# Patient Record
Sex: Male | Born: 1970 | Race: White | Hispanic: No | Marital: Single | Smoking: Current every day smoker
Health system: Southern US, Community
[De-identification: ages and names within clinical notes are randomized; demographics above are authoritative.]

## PROBLEM LIST (undated history)

## (undated) DIAGNOSIS — R0600 Dyspnea, unspecified: Secondary | ICD-10-CM

## (undated) DIAGNOSIS — E785 Hyperlipidemia, unspecified: Secondary | ICD-10-CM

## (undated) DIAGNOSIS — N186 End stage renal disease: Secondary | ICD-10-CM

## (undated) DIAGNOSIS — N184 Chronic kidney disease, stage 4 (severe): Secondary | ICD-10-CM

## (undated) DIAGNOSIS — Z992 Dependence on renal dialysis: Secondary | ICD-10-CM

## (undated) DIAGNOSIS — T7840XA Allergy, unspecified, initial encounter: Secondary | ICD-10-CM

## (undated) DIAGNOSIS — E119 Type 2 diabetes mellitus without complications: Secondary | ICD-10-CM

## (undated) DIAGNOSIS — I471 Supraventricular tachycardia, unspecified: Secondary | ICD-10-CM

## (undated) DIAGNOSIS — I10 Essential (primary) hypertension: Secondary | ICD-10-CM

## (undated) DIAGNOSIS — D649 Anemia, unspecified: Secondary | ICD-10-CM

## (undated) DIAGNOSIS — F32A Depression, unspecified: Secondary | ICD-10-CM

## (undated) DIAGNOSIS — T8130XA Disruption of wound, unspecified, initial encounter: Secondary | ICD-10-CM

## (undated) DIAGNOSIS — G709 Myoneural disorder, unspecified: Secondary | ICD-10-CM

## (undated) DIAGNOSIS — K219 Gastro-esophageal reflux disease without esophagitis: Secondary | ICD-10-CM

## (undated) DIAGNOSIS — N2581 Secondary hyperparathyroidism of renal origin: Secondary | ICD-10-CM

## (undated) DIAGNOSIS — M726 Necrotizing fasciitis: Secondary | ICD-10-CM

## (undated) DIAGNOSIS — I739 Peripheral vascular disease, unspecified: Secondary | ICD-10-CM

## (undated) DIAGNOSIS — N179 Acute kidney failure, unspecified: Secondary | ICD-10-CM

## (undated) DIAGNOSIS — F329 Major depressive disorder, single episode, unspecified: Secondary | ICD-10-CM

## (undated) HISTORY — DX: Secondary hyperparathyroidism of renal origin: N25.81

## (undated) HISTORY — DX: Peripheral vascular disease, unspecified: I73.9

## (undated) HISTORY — PX: NO PAST SURGERIES: SHX2092

## (undated) HISTORY — DX: Allergy, unspecified, initial encounter: T78.40XA

## (undated) HISTORY — PX: AMPUTATION TOE: SHX6595

## (undated) HISTORY — DX: Myoneural disorder, unspecified: G70.9

## (undated) HISTORY — PX: WISDOM TOOTH EXTRACTION: SHX21

## (undated) HISTORY — DX: Gastro-esophageal reflux disease without esophagitis: K21.9

## (undated) HISTORY — DX: Depression, unspecified: F32.A

## (undated) HISTORY — DX: Disruption of wound, unspecified, initial encounter: T81.30XA

## (undated) HISTORY — DX: Anemia, unspecified: D64.9

## (undated) HISTORY — DX: Necrotizing fasciitis: M72.6

## (undated) HISTORY — DX: Hyperlipidemia, unspecified: E78.5

## (undated) HISTORY — DX: Chronic kidney disease, stage 4 (severe): N18.4

## (undated) HISTORY — PX: POLYPECTOMY: SHX149

---
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
## 2005-07-29 ENCOUNTER — Encounter: Admission: RE | Admit: 2005-07-29 | Discharge: 2005-10-27 | Payer: Self-pay | Admitting: Family Medicine

## 2017-09-23 DIAGNOSIS — N179 Acute kidney failure, unspecified: Secondary | ICD-10-CM

## 2017-09-23 HISTORY — DX: Acute kidney failure, unspecified: N17.9

## 2017-10-11 ENCOUNTER — Inpatient Hospital Stay (HOSPITAL_COMMUNITY)
Admission: EM | Admit: 2017-10-11 | Discharge: 2017-10-22 | DRG: 853 | Disposition: A | Discharge status: Skilled Nursing Facility | Payer: Self-pay | Attending: Internal Medicine | Admitting: Internal Medicine

## 2017-10-11 ENCOUNTER — Emergency Department (HOSPITAL_COMMUNITY): Payer: Self-pay

## 2017-10-11 ENCOUNTER — Encounter (HOSPITAL_COMMUNITY): Payer: Self-pay | Admitting: Emergency Medicine

## 2017-10-11 DIAGNOSIS — R059 Cough, unspecified: Secondary | ICD-10-CM

## 2017-10-11 DIAGNOSIS — M86271 Subacute osteomyelitis, right ankle and foot: Secondary | ICD-10-CM

## 2017-10-11 DIAGNOSIS — R112 Nausea with vomiting, unspecified: Secondary | ICD-10-CM

## 2017-10-11 DIAGNOSIS — E1165 Type 2 diabetes mellitus with hyperglycemia: Secondary | ICD-10-CM | POA: Diagnosis present

## 2017-10-11 DIAGNOSIS — E876 Hypokalemia: Secondary | ICD-10-CM | POA: Diagnosis present

## 2017-10-11 DIAGNOSIS — R05 Cough: Secondary | ICD-10-CM

## 2017-10-11 DIAGNOSIS — E1140 Type 2 diabetes mellitus with diabetic neuropathy, unspecified: Secondary | ICD-10-CM | POA: Diagnosis present

## 2017-10-11 DIAGNOSIS — A419 Sepsis, unspecified organism: Principal | ICD-10-CM | POA: Diagnosis present

## 2017-10-11 DIAGNOSIS — Z833 Family history of diabetes mellitus: Secondary | ICD-10-CM

## 2017-10-11 DIAGNOSIS — E1169 Type 2 diabetes mellitus with other specified complication: Secondary | ICD-10-CM | POA: Diagnosis present

## 2017-10-11 DIAGNOSIS — I10 Essential (primary) hypertension: Secondary | ICD-10-CM | POA: Diagnosis present

## 2017-10-11 DIAGNOSIS — L97516 Non-pressure chronic ulcer of other part of right foot with bone involvement without evidence of necrosis: Secondary | ICD-10-CM | POA: Diagnosis present

## 2017-10-11 DIAGNOSIS — M726 Necrotizing fasciitis: Secondary | ICD-10-CM | POA: Diagnosis present

## 2017-10-11 DIAGNOSIS — Z794 Long term (current) use of insulin: Secondary | ICD-10-CM | POA: Diagnosis present

## 2017-10-11 DIAGNOSIS — M869 Osteomyelitis, unspecified: Secondary | ICD-10-CM | POA: Diagnosis present

## 2017-10-11 DIAGNOSIS — K219 Gastro-esophageal reflux disease without esophagitis: Secondary | ICD-10-CM | POA: Diagnosis present

## 2017-10-11 DIAGNOSIS — E860 Dehydration: Secondary | ICD-10-CM | POA: Diagnosis present

## 2017-10-11 DIAGNOSIS — L98499 Non-pressure chronic ulcer of skin of other sites with unspecified severity: Secondary | ICD-10-CM

## 2017-10-11 DIAGNOSIS — Z87891 Personal history of nicotine dependence: Secondary | ICD-10-CM

## 2017-10-11 DIAGNOSIS — N179 Acute kidney failure, unspecified: Secondary | ICD-10-CM | POA: Diagnosis present

## 2017-10-11 DIAGNOSIS — L03115 Cellulitis of right lower limb: Secondary | ICD-10-CM | POA: Diagnosis present

## 2017-10-11 DIAGNOSIS — E11621 Type 2 diabetes mellitus with foot ulcer: Secondary | ICD-10-CM | POA: Diagnosis present

## 2017-10-11 HISTORY — DX: Essential (primary) hypertension: I10

## 2017-10-11 HISTORY — DX: Type 2 diabetes mellitus with hyperglycemia: E11.65

## 2017-10-11 HISTORY — DX: Acute kidney failure, unspecified: N17.9

## 2017-10-11 HISTORY — DX: Type 2 diabetes mellitus without complications: E11.9

## 2017-10-11 LAB — CBC WITH DIFFERENTIAL/PLATELET
Basophils Absolute: 0 10*3/uL (ref 0.0–0.1)
Basophils Relative: 0 %
Eosinophils Absolute: 0 10*3/uL (ref 0.0–0.7)
Eosinophils Relative: 0 %
HCT: 40.8 % (ref 39.0–52.0)
Hemoglobin: 13.8 g/dL (ref 13.0–17.0)
Lymphocytes Relative: 6 %
Lymphs Abs: 1.5 10*3/uL (ref 0.7–4.0)
MCH: 27.8 pg (ref 26.0–34.0)
MCHC: 33.8 g/dL (ref 30.0–36.0)
MCV: 82.1 fL (ref 78.0–100.0)
Monocytes Absolute: 0.8 10*3/uL (ref 0.1–1.0)
Monocytes Relative: 3 %
Neutro Abs: 22.8 10*3/uL — ABNORMAL HIGH (ref 1.7–7.7)
Neutrophils Relative %: 91 %
Platelets: 275 10*3/uL (ref 150–400)
RBC: 4.97 MIL/uL (ref 4.22–5.81)
RDW: 13.5 % (ref 11.5–15.5)
WBC: 25.1 10*3/uL — ABNORMAL HIGH (ref 4.0–10.5)

## 2017-10-11 LAB — I-STAT CG4 LACTIC ACID, ED
Lactic Acid, Venous: 3.03 mmol/L (ref 0.5–1.9)
Lactic Acid, Venous: 2.35 mmol/L (ref 0.5–1.9)

## 2017-10-11 LAB — COMPREHENSIVE METABOLIC PANEL
ALT: 10 U/L — ABNORMAL LOW (ref 17–63)
AST: 14 U/L — ABNORMAL LOW (ref 15–41)
Albumin: 2.8 g/dL — ABNORMAL LOW (ref 3.5–5.0)
Alkaline Phosphatase: 119 U/L (ref 38–126)
Anion gap: 23 — ABNORMAL HIGH (ref 5–15)
BUN: 31 mg/dL — ABNORMAL HIGH (ref 6–20)
CO2: 22 mmol/L (ref 22–32)
Calcium: 8.7 mg/dL — ABNORMAL LOW (ref 8.9–10.3)
Chloride: 84 mmol/L — ABNORMAL LOW (ref 101–111)
Creatinine, Ser: 1.66 mg/dL — ABNORMAL HIGH (ref 0.61–1.24)
GFR calc Af Amer: 56 mL/min — ABNORMAL LOW (ref >60)
GFR calc non Af Amer: 49 mL/min — ABNORMAL LOW (ref >60)
Glucose, Bld: 422 mg/dL — ABNORMAL HIGH (ref 65–99)
Potassium: 3.7 mmol/L (ref 3.5–5.1)
Sodium: 129 mmol/L — ABNORMAL LOW (ref 135–145)
Total Bilirubin: 1.6 mg/dL — ABNORMAL HIGH (ref 0.3–1.2)
Total Protein: 8.4 g/dL — ABNORMAL HIGH (ref 6.5–8.1)

## 2017-10-11 LAB — I-STAT VENOUS BLOOD GAS, ED
Acid-Base Excess: 5 mmol/L — ABNORMAL HIGH (ref 0.0–2.0)
Bicarbonate: 30.3 mmol/L — ABNORMAL HIGH (ref 20.0–28.0)
O2 Saturation: 56 %
TCO2: 32 mmol/L (ref 22–32)
pCO2, Ven: 43.9 mm[Hg] — ABNORMAL LOW (ref 44.0–60.0)
pH, Ven: 7.446 — ABNORMAL HIGH (ref 7.250–7.430)
pO2, Ven: 28 mm[Hg] — CL (ref 32.0–45.0)

## 2017-10-11 LAB — CBG MONITORING, ED: Glucose-Capillary: 339 mg/dL — ABNORMAL HIGH (ref 65–99)

## 2017-10-11 LAB — LIPASE, BLOOD: Lipase: 28 U/L (ref 11–51)

## 2017-10-11 MED ORDER — ONDANSETRON HCL 4 MG/2ML IJ SOLN
4.0000 mg | Freq: Four times a day (QID) | INTRAMUSCULAR | Status: DC | PRN
Start: 2017-10-11 — End: 2017-10-22
  Administered 2017-10-12 – 2017-10-15 (×5): 4 mg via INTRAVENOUS
  Filled 2017-10-11 (×6): qty 2

## 2017-10-11 MED ORDER — SODIUM CHLORIDE 0.9 % IV BOLUS (SEPSIS)
1000.0000 mL | Freq: Once | INTRAVENOUS | Status: AC
  Administered 2017-10-11: 1000 mL via INTRAVENOUS

## 2017-10-11 MED ORDER — CLINDAMYCIN PHOSPHATE 900 MG/50ML IV SOLN
900.0000 mg | Freq: Once | INTRAVENOUS | Status: AC
  Administered 2017-10-11: 900 mg via INTRAVENOUS
  Filled 2017-10-11: qty 50

## 2017-10-11 MED ORDER — ACETAMINOPHEN 325 MG PO TABS: 650.0000 mg | ORAL_TABLET | Freq: Four times a day (QID) | ORAL | Status: DC | PRN

## 2017-10-11 MED ORDER — PIPERACILLIN-TAZOBACTAM 3.375 G IVPB 30 MIN
3.3750 g | Freq: Once | INTRAVENOUS | Status: AC
  Administered 2017-10-11: 3.375 g via INTRAVENOUS
  Filled 2017-10-11: qty 50

## 2017-10-11 MED ORDER — ACETAMINOPHEN 650 MG RE SUPP: 650.0000 mg | Freq: Four times a day (QID) | RECTAL | Status: DC | PRN

## 2017-10-11 MED ORDER — INSULIN ASPART 100 UNIT/ML ~~LOC~~ SOLN
0.0000 [IU] | SUBCUTANEOUS | Status: DC
  Administered 2017-10-11: 7 [IU] via SUBCUTANEOUS
  Administered 2017-10-12: 5 [IU] via SUBCUTANEOUS
  Filled 2017-10-11 (×2): qty 1

## 2017-10-11 MED ORDER — ONDANSETRON HCL 4 MG/2ML IJ SOLN
4.0000 mg | Freq: Once | INTRAMUSCULAR | Status: AC
Start: 2017-10-11 — End: 2017-10-11
  Administered 2017-10-11: 4 mg via INTRAVENOUS
  Filled 2017-10-11: qty 2

## 2017-10-11 MED ORDER — SODIUM CHLORIDE 0.9 % IV SOLN
INTRAVENOUS | Status: DC
  Administered 2017-10-11: 23:00:00 via INTRAVENOUS

## 2017-10-11 MED ORDER — CLINDAMYCIN PHOSPHATE 900 MG/50ML IV SOLN
900.0000 mg | Freq: Three times a day (TID) | INTRAVENOUS | Status: DC
  Administered 2017-10-12 – 2017-10-13 (×5): 900 mg via INTRAVENOUS
  Filled 2017-10-11 (×7): qty 50

## 2017-10-11 MED ORDER — ONDANSETRON HCL 4 MG PO TABS: 4.0000 mg | ORAL_TABLET | Freq: Four times a day (QID) | ORAL | Status: DC | PRN

## 2017-10-11 MED ORDER — VANCOMYCIN HCL 10 G IV SOLR
2000.0000 mg | Freq: Once | INTRAVENOUS | Status: AC
  Administered 2017-10-11: 2000 mg via INTRAVENOUS
  Filled 2017-10-11: qty 2000

## 2017-10-11 MED ORDER — PIPERACILLIN-TAZOBACTAM 3.375 G IVPB
3.3750 g | Freq: Three times a day (TID) | INTRAVENOUS | Status: DC
  Administered 2017-10-12 – 2017-10-15 (×10): 3.375 g via INTRAVENOUS
  Filled 2017-10-11 (×12): qty 50

## 2017-10-11 MED ORDER — METOCLOPRAMIDE HCL 5 MG/ML IJ SOLN
5.0000 mg | Freq: Once | INTRAMUSCULAR | Status: AC
  Administered 2017-10-11: 5 mg via INTRAVENOUS
  Filled 2017-10-11: qty 2

## 2017-10-11 MED ORDER — VANCOMYCIN HCL 10 G IV SOLR
1250.0000 mg | Freq: Two times a day (BID) | INTRAVENOUS | Status: AC
  Administered 2017-10-12 – 2017-10-15 (×6): 1250 mg via INTRAVENOUS
  Filled 2017-10-11 (×8): qty 1250

## 2017-10-11 MED ORDER — INSULIN GLARGINE 100 UNIT/ML ~~LOC~~ SOLN
10.0000 [IU] | Freq: Every day | SUBCUTANEOUS | Status: DC
  Administered 2017-10-12: 10 [IU] via SUBCUTANEOUS
  Filled 2017-10-11: qty 0.1

## 2017-10-11 NOTE — ED Triage Notes (Signed)
Pt states he has been hiccups for four days that have been causing him to vomit. Pt states he has only been able to eat one can of jello in the past 4 days. Pt also complains that his right foot has an infection- for three days. Foul odor noted in triage. Pt states the underside of his foot has black drainage. Pt has not hiccupped while in triage.

## 2017-10-11 NOTE — ED Notes (Signed)
Patient transported to X-ray 

## 2017-10-11 NOTE — ED Notes (Signed)
Writer notified [facility] [place] of abnormal I-stat lactic, venous result

## 2017-10-11 NOTE — ED Notes (Signed)
Pt's CBG result was 339. Informed Nikki - RN.

## 2017-10-11 NOTE — ED Notes (Signed)
Wound care applied to R foot and lotion applied and wrapped around bilateral legs

## 2017-10-11 NOTE — ED Notes (Signed)
Mali Grose, RN Hydrographic surveyor) and Daralene Milch, RN ([employer] [employer]) notified of I stat lactic acid results 3.03

## 2017-10-11 NOTE — ED Notes (Signed)
[name] [name] (father)- [phone]  cell [phone]

## 2017-10-11 NOTE — Progress Notes (Signed)
Pharmacy Antibiotic Note  [name] [name] is a 46 y.o. male admitted on 10/11/2017 with sepsis.  Pharmacy has been consulted for vancomycin dosing. WBC 25.1, LA 3.0, afebrile  Plan: Vancomycin 2000mg  IV x1 then vancomycin 1250mg   IV every 12 hours.  Goal trough 15-20 mcg/mL.  Height: 6\' 5"  (195.6 cm) Weight: 207 lb (93.9 kg) IBW/kg (Calculated) : 89.1  Temp (24hrs), Avg:98.1 F (36.7 C), Min:98.1 F (36.7 C), Max:98.1 F (36.7 C)  Recent Labs  Lab 10/11/17 1816 10/11/17 1836  WBC 25.1*  --   CREATININE 1.66*  --   LATICACIDVEN  --  3.03*    Estimated Creatinine Clearance: 71.6 mL/min (A) (by C-G formula based on SCr of 1.66 mg/dL (H)).    No Known Allergies  Thank you for allowing pharmacy to be a part of this patient's care.  [name] [name] 10/11/2017 8:07 PM

## 2017-10-11 NOTE — Progress Notes (Signed)
Pharmacy Antibiotic Note  [name] [name] is a 46 y.o. male admitted on 10/11/2017 with sepsis and concern for cellulitis. Per EDP, concern for necrotizing fasciitis as well.  Pharmacy has been consulted for Vancomycin and  dosing. Patient also noted to be on clindamycin per MD. WBC 25.1, LA 2.35, and afebrile.   SCr 1.66 for estimated CrCl ~ 70-75 mL/min.   Patient received vancomycin 2g IV x1 and Zosyn 3.375g IV x1 in the ED.   Plan: Vancomycin 1250 mg IV q12hr  Zosyn 3.375g IV q8hr Vancomycin trough at SS and PRN (goal 15-20 mcg/mL) Monitor renal function, clinical picture, and culture data F/u length of therapy and de-escalation   Height: 6\' 5"  (195.6 cm) Weight: 207 lb (93.9 kg) IBW/kg (Calculated) : 89.1  Temp (24hrs), Avg:98.1 F (36.7 C), Min:98.1 F (36.7 C), Max:98.1 F (36.7 C)  Recent Labs  Lab 10/11/17 1816 10/11/17 1836 10/11/17 2030  WBC 25.1*  --   --   CREATININE 1.66*  --   --   LATICACIDVEN  --  3.03* 2.35*    Estimated Creatinine Clearance: 71.6 mL/min (A) (by C-G formula based on SCr of 1.66 mg/dL (H)).    No Known Allergies   Antimicrobials 11/19 Vanc >>  11/19 Zosyn >>  11/19 Clinda >>   Microbiology: pending   Lavonda Jumbo, PharmD Clinical Pharmacist 10/11/17 11:04 PM

## 2017-10-11 NOTE — H&P (Addendum)
History and Physical    [name] [name]:[id number] DOB: 1971-09-14 DOA: 10/11/2017  PCP: Patient, No Pcp Per  Patient coming from: Home.  Chief Complaint: Nausea vomiting not feeling well.  HPI: [name] [name] is a 46 y.o. male with male with history of diabetes mellitus type 2 has not been to a physician for many years presents to the ER with complaint of not feeling well no nausea vomiting or increasing discharge from the right foot.  Patient stated over the last 1 week patient has benign persistent nausea vomiting occasional diarrhea but denies any abdominal pain.  Also has been having chronic wounds of the lower extremities which has been found to have increasing drainage on the right side.  ED Course: The patient is found to have markedly elevated blood sugar and gap was elevated at 23 has leukocytosis and x-rays revealed possible necrotizing fasciitis of the right foot.  He had physician had discussed with Dr. Eulah Pont on-call orthopedic surgeon who will be seeing patient in consult.  Patient was given fluid bolus for sepsis and started on antibiotics after blood cultures were obtained.  On exam patient is not in distress but still complains of nausea vomiting but abdomen appears benign.  Denies any chest pain.  Review of Systems: As per HPI, rest all negative.   Past Medical History:  Diagnosis Date  . Diabetes mellitus without complication (HCC)   . Hypertension     History reviewed. No pertinent surgical history.   reports that he has quit smoking. he has never used smokeless tobacco. He reports that he does not drink alcohol or use drugs.  No Known Allergies  Family History  Problem Relation Age of Onset  . Diabetes Mellitus II Mother     Prior to Admission medications   Not on File    Physical Exam: Vitals:   10/11/17 2015 10/11/17 2045 10/11/17 2130 10/11/17 2215  BP: 126/85 (!) 139/98 (!) 166/98 (!) 161/99  Pulse: (!) 111 (!) 115 (!) 108 (!) 106    Resp:      Temp:      TempSrc:      SpO2: 99% 99% 98% 97%  Weight:      Height:          Constitutional: Moderately built and nourished. Vitals:   10/11/17 2015 10/11/17 2045 10/11/17 2130 10/11/17 2215  BP: 126/85 (!) 139/98 (!) 166/98 (!) 161/99  Pulse: (!) 111 (!) 115 (!) 108 (!) 106  Resp:      Temp:      TempSrc:      SpO2: 99% 99% 98% 97%  Weight:      Height:       Eyes: Anicteric no pallor. ENMT: No discharge from the ears eyes nose and mouth. Neck: No mass felt.  No neck rigidity. Respiratory: No rhonchi or crepitations. Cardiovascular: S1-S2 heard no murmurs appreciated. Abdomen: Soft nontender bowel sounds present. Musculoskeletal: Patient has dressing on the right foot and left foot has ulceration on the plantar aspect.  Photos revealed ulceration in the right foot with some skin darkening on the medial aspect. Skin: Foot ulcers. Neurologic: Alert awake oriented to time place and person.  Moves all extremities. Psychiatric: Appears normal.  Normal affect.   Labs on Admission: I have personally reviewed following labs and imaging studies  CBC: Recent Labs  Lab 10/11/17 1816  WBC 25.1*  NEUTROABS 22.8*  HGB 13.8  HCT 40.8  MCV 82.1  PLT 275   Basic  Metabolic Panel: Recent Labs  Lab 10/11/17 1816  NA 129*  K 3.7  CL 84*  CO2 22  GLUCOSE 422*  BUN 31*  CREATININE 1.66*  CALCIUM 8.7*   GFR: Estimated Creatinine Clearance: 71.6 mL/min (A) (by C-G formula based on SCr of 1.66 mg/dL (H)). Liver Function Tests: Recent Labs  Lab 10/11/17 1816  AST 14*  ALT 10*  ALKPHOS 119  BILITOT 1.6*  PROT 8.4*  ALBUMIN 2.8*   Recent Labs  Lab 10/11/17 2009  LIPASE 28   No results for input(s): AMMONIA in the last 168 hours. Coagulation Profile: No results for input(s): INR, PROTIME in the last 168 hours. Cardiac Enzymes: No results for input(s): CKTOTAL, CKMB, CKMBINDEX, TROPONINI in the last 168 hours. BNP (last 3 results) No results for  input(s): PROBNP in the last 8760 hours. HbA1C: No results for input(s): HGBA1C in the last 72 hours. CBG: Recent Labs  Lab 10/11/17 2219  GLUCAP 339*   Lipid Profile: No results for input(s): CHOL, HDL, LDLCALC, TRIG, CHOLHDL, LDLDIRECT in the last 72 hours. Thyroid Function Tests: No results for input(s): TSH, T4TOTAL, FREET4, T3FREE, THYROIDAB in the last 72 hours. Anemia Panel: No results for input(s): VITAMINB12, FOLATE, FERRITIN, TIBC, IRON, RETICCTPCT in the last 72 hours. Urine analysis: No results found for: COLORURINE, APPEARANCEUR, LABSPEC, PHURINE, GLUCOSEU, HGBUR, BILIRUBINUR, KETONESUR, PROTEINUR, UROBILINOGEN, NITRITE, LEUKOCYTESUR Sepsis Labs: @LABRCNTIP (procalcitonin:4,lacticidven:4) )No results found for this or any previous visit (from the past 240 hour(s)).   Radiological Exams on Admission: Dg Abdomen Acute W/chest  Result Date: 10/11/2017 CLINICAL DATA:  46 y/o  M; 4 days of hiccups with vomiting. EXAM: DG ABDOMEN ACUTE W/ 1V CHEST COMPARISON:  None. FINDINGS: There is no evidence of dilated bowel loops or free intraperitoneal air. No radiopaque calculi or other significant radiographic abnormality is seen. Heart size and mediastinal contours are within normal limits. Both lungs are clear. IMPRESSION: Negative abdominal radiographs.  No acute cardiopulmonary disease. Electronically Signed   By: Mitzi Hansen M.D.   On: 10/11/2017 20:27   Dg Foot Complete Right  Result Date: 10/11/2017 CLINICAL DATA:  46 year old male with sepsis.  Right foot infection. EXAM: RIGHT FOOT COMPLETE - 3+ VIEW COMPARISON:  None. FINDINGS: There is no acute fracture or dislocation. The bones are osteopenic. Mild arthritic changes of the second MTP joint. No definite bone erosion or periosteal reaction to suggest osteomyelitis. There is diffuse soft tissue edema and scattered areas of soft tissue air primarily over the medial aspect of the tarsal region concerning for  necrotizing fasciitis. Clinical correlation is recommended. IMPRESSION: 1. No acute fracture or dislocation. 2. No definite radiographic evidence of osteomyelitis. MRI or a WBC nuclear scan may provide better evaluation if there is high clinical concern for osteomyelitis. 3. Diffuse soft tissue swelling of the foot with air most consistent with necrotizing fasciitis. Clinical correlation is recommended. Electronically Signed   By: Elgie Collard M.D.   On: 10/11/2017 20:32     Assessment/Plan Active Problems:   Sepsis (HCC)   Cellulitis of right foot   Uncontrolled type 2 diabetes mellitus with hyperglycemia (HCC)   ARF (acute renal failure) (HCC)   Nausea & vomiting    1. Sepsis with possible developing necrotizing fasciitis of the right foot -patient has been placed on vancomycin Zosyn and clindamycin.  Orthopedic surgeon Eulah Pont has been consulted and patient will be kept n.p.o. in anticipation of procedure.  Follow cultures and lactic acid levels. 2. Diabetes mellitus type 2 with possible developing  DKA -I have ordered repeat basic metabolic panel and if it shows persistently elevated anion gap I would start with patient on IV insulin infusion.  Check hemoglobin A1c.  For now I have ordered just Lantus. 3. Elevated blood pressure on admission -we will follow blood pressure trend. 4. Acute renal failure -no old labs to compare.  Follow metabolic panel continue hydration. 5. Nausea vomiting -differentials include possible gastroparesis secondary to diabetes versus gastroenteritis versus possible DKA.  I have ordered 1 dose of Reglan.  Closely follow metabolic panel but abdomen appears benign.  X-rays do not show any obstruction.   DVT prophylaxis: SCDs in anticipation of surgery. Code Status: Full code. Family Communication: Discussed with patient. Disposition Plan: To be determined. Consults called: Orthopedic surgeon. Admission status: Inpatient.   Eduard Clos MD [facility] Pager [phone].  If 7PM-7AM, please contact night-coverage [email or web address] Password [email or web address]  10/11/2017, 10:58 PM

## 2017-10-11 NOTE — ED Provider Notes (Addendum)
[place] EMERGENCY DEPARTMENT Provider Note   CSN: [id number] Arrival date & time: 10/11/17  1751     History   Chief Complaint Chief Complaint  Patient presents with  . Hiccups  . Foot Injury    infection    HPI [name] [name] is a 46 y.o. male.  HPI Patient presents with nausea vomiting and feeling bad.  He has felt bad for the last week.  States his hiccups.  He states he has had very little oral intake.  Has had some urinary frequency.  Also has had a bad wound on his right foot for about the same time.  States is been draining.  It is foul-smelling.  Denies fevers.  States he has been borderline diabetic in the past.  Has not been on any medicines for it but does not usually see a doctor.  States he has had some surgery on his foot in the past at an outside hospital.  States he has been unable to sleep because when he lays down he gets hiccups. has had upper abdominal pain also.  Little bit of diarrhea but has been vomiting frequently he says.  Denies substance abuse. Past Medical History:  Diagnosis Date  . Diabetes mellitus without complication ([place])   . Hypertension     Patient Active Problem List   Diagnosis Date Noted  . Sepsis ([place]) 10/11/2017    History reviewed. No pertinent surgical history.     Home Medications    Prior to Admission medications   Not on File    Family History No family history on file.  Social History Social History   Tobacco Use  . Smoking status: Former Research scientist (life sciences)  . Smokeless tobacco: Never Used  Substance Use Topics  . Alcohol use: No    Frequency: Never  . Drug use: No     Allergies   Patient has no known allergies.   Review of Systems Review of Systems  Constitutional: Positive for fatigue. Negative for appetite change and fever.  Respiratory: Positive for shortness of breath.   Gastrointestinal: Positive for abdominal pain, nausea and vomiting.  Genitourinary: Negative for flank pain and  frequency.  Musculoskeletal: Negative for back pain.  Skin: Positive for wound. Negative for color change.  Neurological: Negative for seizures.  Hematological: Negative for adenopathy.  Psychiatric/Behavioral: Negative for confusion.     Physical Exam Updated Vital Signs BP (!) 139/98   Pulse (!) 115   Temp 98.1 F (36.7 C) (Oral)   Resp 18   Ht 6\' 5"  (1.956 m)   Wt 93.9 kg (207 lb)   SpO2 99%   BMI 24.55 kg/m   Physical Exam  Constitutional: He appears well-developed.  HENT:  Head: Normocephalic.  Cardiovascular:  Mild tachycardia  Pulmonary/Chest: Effort normal.  Abdominal: There is tenderness.  Musculoskeletal: He exhibits edema and tenderness.  Wound to arch of right foot.  Foul-smelling brown drainage with bubbles expressed with palpation of the wound.  Extends both anterior and posterior.  Also has another wound over the ball of the foot.  Also has swelling laterally over the fifth MCP area.  Neurological: He is alert.  Skin: Skin is warm. Capillary refill takes less than 2 seconds.         ED Treatments / Results  Labs (all labs ordered are listed, but only abnormal results are displayed) Labs Reviewed  COMPREHENSIVE METABOLIC PANEL - Abnormal; Notable for the following components:      Result Value  Sodium 129 (*)    Chloride 84 (*)    Glucose, Bld 422 (*)    BUN 31 (*)    Creatinine, Ser 1.66 (*)    Calcium 8.7 (*)    Total Protein 8.4 (*)    Albumin 2.8 (*)    AST 14 (*)    ALT 10 (*)    Total Bilirubin 1.6 (*)    GFR calc non Af Amer 49 (*)    GFR calc Af Amer 56 (*)    Anion gap 23 (*)    All other components within normal limits  CBC WITH DIFFERENTIAL/PLATELET - Abnormal; Notable for the following components:   WBC 25.1 (*)    Neutro Abs 22.8 (*)    All other components within normal limits  I-STAT CG4 LACTIC ACID, ED - Abnormal; Notable for the following components:   Lactic Acid, Venous 3.03 (*)    All other components within normal  limits  I-STAT CG4 LACTIC ACID, ED - Abnormal; Notable for the following components:   Lactic Acid, Venous 2.35 (*)    All other components within normal limits  I-STAT VENOUS BLOOD GAS, ED - Abnormal; Notable for the following components:   pH, Ven 7.446 (*)    pCO2, Ven 43.9 (*)    pO2, Ven 28.0 (*)    Bicarbonate 30.3 (*)    Acid-Base Excess 5.0 (*)    All other components within normal limits  CULTURE, BLOOD (ROUTINE X 2)  CULTURE, BLOOD (ROUTINE X 2)  LIPASE, BLOOD  URINALYSIS, ROUTINE W REFLEX MICROSCOPIC  BLOOD GAS, VENOUS  I-STAT CG4 LACTIC ACID, ED    EKG  EKG Interpretation None       Radiology Dg Abdomen Acute W/chest  Result Date: 10/11/2017 CLINICAL DATA:  46 y/o  M; 4 days of hiccups with vomiting. EXAM: DG ABDOMEN ACUTE W/ 1V CHEST COMPARISON:  None. FINDINGS: There is no evidence of dilated bowel loops or free intraperitoneal air. No radiopaque calculi or other significant radiographic abnormality is seen. Heart size and mediastinal contours are within normal limits. Both lungs are clear. IMPRESSION: Negative abdominal radiographs.  No acute cardiopulmonary disease. Electronically Signed   By: Kristine Garbe M.D.   On: 10/11/2017 20:27   Dg Foot Complete Right  Result Date: 10/11/2017 CLINICAL DATA:  46 year old male with sepsis.  Right foot infection. EXAM: RIGHT FOOT COMPLETE - 3+ VIEW COMPARISON:  None. FINDINGS: There is no acute fracture or dislocation. The bones are osteopenic. Mild arthritic changes of the second MTP joint. No definite bone erosion or periosteal reaction to suggest osteomyelitis. There is diffuse soft tissue edema and scattered areas of soft tissue air primarily over the medial aspect of the tarsal region concerning for necrotizing fasciitis. Clinical correlation is recommended. IMPRESSION: 1. No acute fracture or dislocation. 2. No definite radiographic evidence of osteomyelitis. MRI or a WBC nuclear scan may provide better  evaluation if there is high clinical concern for osteomyelitis. 3. Diffuse soft tissue swelling of the foot with air most consistent with necrotizing fasciitis. Clinical correlation is recommended. Electronically Signed   By: Anner Crete M.D.   On: 10/11/2017 20:32    Procedures Procedures (including critical care time)  Medications Ordered in ED Medications  sodium chloride 0.9 % bolus 1,000 mL (1,000 mLs Intravenous New Bag/Given 10/11/17 2034)  sodium chloride 0.9 % bolus 1,000 mL (1,000 mLs Intravenous New Bag/Given 10/11/17 2033)  vancomycin (VANCOCIN) 2,000 mg in sodium chloride 0.9 % 500 mL IVPB (2,000  mg Intravenous New Bag/Given 10/11/17 2047)  vancomycin (VANCOCIN) 1,250 mg in sodium chloride 0.9 % 250 mL IVPB (not administered)  sodium chloride 0.9 % bolus 1,000 mL (1,000 mLs Intravenous New Bag/Given 10/11/17 2048)  clindamycin (CLEOCIN) IVPB 900 mg (900 mg Intravenous New Bag/Given 10/11/17 2113)  piperacillin-tazobactam (ZOSYN) IVPB 3.375 g (0 g Intravenous Stopped 10/11/17 2112)  ondansetron (ZOFRAN) injection 4 mg (4 mg Intravenous Given 10/11/17 2020)     Initial Impression / Assessment and Plan / ED Course  I have reviewed the triage vital signs and the nursing notes.  Pertinent labs & imaging results that were available during my care of the patient were reviewed by me and considered in my medical decision making (see chart for details).     Patient with gas-forming infection of the right foot.  Potentially necrotizing fasciitis.  White count is elevated.  Initial lactic acid elevated at 3 but has decreased some with some IV fluids.  Zosyn and vancomycin empirically started.  Has elevated glucose and anion gap but is not acidotic.  Will recheck sugar after fluid boluses.  Orthopedic surgery was urgently consulted for likely operating room.  Creatinine mildly elevated but unknown baseline.  Will admit to hospitalist with the comorbidities.  CRITICAL CARE Performed  by: Davonna Belling Total critical care time: 30 minutes Critical care time was exclusive of separately billable procedures and treating other patients. Critical care was necessary to treat or prevent imminent or life-threatening deterioration. Critical care was time spent personally by me on the following activities: development of treatment plan with patient and/or surrogate as well as nursing, discussions with consultants, evaluation of patient's response to treatment, examination of patient, obtaining history from patient or surrogate, ordering and performing treatments and interventions, ordering and review of laboratory studies, ordering and review of radiographic studies, pulse oximetry and re-evaluation of patient's condition.  Patient does have source of infection.  Creatinine is mildly elevated but unknown baseline.  With potential severe and as of this I have activated a code sepsis.  However lactic acid is not severely elevated and has not been hypotensive.  Does have potential endorgan damage however with the elevated creatinine. Dr. Percell Miller is reviewing patient from orthopedic surgery.   Final Clinical Impressions(s) / ED Diagnoses   Final diagnoses:  Sepsis, due to unspecified organism [facility])    ED Discharge Orders    None      Davonna Belling, MD 10/11/17 2049  Davonna Belling, MD 10/11/17 2118

## 2017-10-12 ENCOUNTER — Other Ambulatory Visit: Payer: Self-pay

## 2017-10-12 ENCOUNTER — Encounter (HOSPITAL_COMMUNITY): Payer: Self-pay | Admitting: General Practice

## 2017-10-12 ENCOUNTER — Telehealth (INDEPENDENT_AMBULATORY_CARE_PROVIDER_SITE_OTHER): Payer: Self-pay

## 2017-10-12 ENCOUNTER — Other Ambulatory Visit (INDEPENDENT_AMBULATORY_CARE_PROVIDER_SITE_OTHER): Payer: Self-pay | Admitting: Orthopedic Surgery

## 2017-10-12 ENCOUNTER — Inpatient Hospital Stay (HOSPITAL_COMMUNITY): Payer: Self-pay

## 2017-10-12 DIAGNOSIS — E1165 Type 2 diabetes mellitus with hyperglycemia: Secondary | ICD-10-CM

## 2017-10-12 DIAGNOSIS — L03115 Cellulitis of right lower limb: Secondary | ICD-10-CM

## 2017-10-12 DIAGNOSIS — M86271 Subacute osteomyelitis, right ankle and foot: Secondary | ICD-10-CM

## 2017-10-12 DIAGNOSIS — N179 Acute kidney failure, unspecified: Secondary | ICD-10-CM

## 2017-10-12 LAB — BASIC METABOLIC PANEL
Anion gap: 16 — ABNORMAL HIGH (ref 5–15)
Anion gap: 16 — ABNORMAL HIGH (ref 5–15)
Anion gap: 14 (ref 5–15)
Anion gap: 14 (ref 5–15)
Anion gap: 12 (ref 5–15)
BUN: 29 mg/dL — ABNORMAL HIGH (ref 6–20)
BUN: 30 mg/dL — ABNORMAL HIGH (ref 6–20)
BUN: 26 mg/dL — ABNORMAL HIGH (ref 6–20)
BUN: 26 mg/dL — ABNORMAL HIGH (ref 6–20)
BUN: 25 mg/dL — ABNORMAL HIGH (ref 6–20)
CO2: 22 mmol/L (ref 22–32)
CO2: 25 mmol/L (ref 22–32)
CO2: 25 mmol/L (ref 22–32)
CO2: 27 mmol/L (ref 22–32)
CO2: 28 mmol/L (ref 22–32)
Calcium: 7.6 mg/dL — ABNORMAL LOW (ref 8.9–10.3)
Calcium: 8 mg/dL — ABNORMAL LOW (ref 8.9–10.3)
Calcium: 8 mg/dL — ABNORMAL LOW (ref 8.9–10.3)
Calcium: 8.2 mg/dL — ABNORMAL LOW (ref 8.9–10.3)
Calcium: 8.2 mg/dL — ABNORMAL LOW (ref 8.9–10.3)
Chloride: 95 mmol/L — ABNORMAL LOW (ref 101–111)
Chloride: 96 mmol/L — ABNORMAL LOW (ref 101–111)
Chloride: 97 mmol/L — ABNORMAL LOW (ref 101–111)
Chloride: 97 mmol/L — ABNORMAL LOW (ref 101–111)
Chloride: 96 mmol/L — ABNORMAL LOW (ref 101–111)
Creatinine, Ser: 1.44 mg/dL — ABNORMAL HIGH (ref 0.61–1.24)
Creatinine, Ser: 1.34 mg/dL — ABNORMAL HIGH (ref 0.61–1.24)
Creatinine, Ser: 1.19 mg/dL (ref 0.61–1.24)
Creatinine, Ser: 1.18 mg/dL (ref 0.61–1.24)
Creatinine, Ser: 1.23 mg/dL (ref 0.61–1.24)
GFR calc Af Amer: >60 mL/min (ref >60)
GFR calc Af Amer: >60 mL/min (ref >60)
GFR calc Af Amer: >60 mL/min (ref >60)
GFR calc Af Amer: >60 mL/min (ref >60)
GFR calc Af Amer: >60 mL/min (ref >60)
GFR calc non Af Amer: 58 mL/min — ABNORMAL LOW (ref >60)
GFR calc non Af Amer: >60 mL/min (ref >60)
GFR calc non Af Amer: >60 mL/min (ref >60)
GFR calc non Af Amer: >60 mL/min (ref >60)
GFR calc non Af Amer: >60 mL/min (ref >60)
Glucose, Bld: 355 mg/dL — ABNORMAL HIGH (ref 65–99)
Glucose, Bld: 271 mg/dL — ABNORMAL HIGH (ref 65–99)
Glucose, Bld: 233 mg/dL — ABNORMAL HIGH (ref 65–99)
Glucose, Bld: 175 mg/dL — ABNORMAL HIGH (ref 65–99)
Glucose, Bld: 171 mg/dL — ABNORMAL HIGH (ref 65–99)
Potassium: 3.3 mmol/L — ABNORMAL LOW (ref 3.5–5.1)
Potassium: 3.7 mmol/L (ref 3.5–5.1)
Potassium: 3.1 mmol/L — ABNORMAL LOW (ref 3.5–5.1)
Potassium: 2.9 mmol/L — ABNORMAL LOW (ref 3.5–5.1)
Potassium: 2.7 mmol/L — CL (ref 3.5–5.1)
Sodium: 133 mmol/L — ABNORMAL LOW (ref 135–145)
Sodium: 137 mmol/L (ref 135–145)
Sodium: 136 mmol/L (ref 135–145)
Sodium: 138 mmol/L (ref 135–145)
Sodium: 136 mmol/L (ref 135–145)

## 2017-10-12 LAB — GLUCOSE, CAPILLARY
Glucose-Capillary: 177 mg/dL — ABNORMAL HIGH (ref 65–99)
Glucose-Capillary: 176 mg/dL — ABNORMAL HIGH (ref 65–99)
Glucose-Capillary: 201 mg/dL — ABNORMAL HIGH (ref 65–99)
Glucose-Capillary: 159 mg/dL — ABNORMAL HIGH (ref 65–99)
Glucose-Capillary: 127 mg/dL — ABNORMAL HIGH (ref 65–99)

## 2017-10-12 LAB — LACTIC ACID, PLASMA: Lactic Acid, Venous: 1.5 mmol/L (ref 0.5–1.9)

## 2017-10-12 LAB — CBC
HCT: 34.4 % — ABNORMAL LOW (ref 39.0–52.0)
Hemoglobin: 11.5 g/dL — ABNORMAL LOW (ref 13.0–17.0)
MCH: 27.4 pg (ref 26.0–34.0)
MCHC: 33.4 g/dL (ref 30.0–36.0)
MCV: 81.9 fL (ref 78.0–100.0)
Platelets: 251 10*3/uL (ref 150–400)
RBC: 4.2 MIL/uL — ABNORMAL LOW (ref 4.22–5.81)
RDW: 13.5 % (ref 11.5–15.5)
WBC: 21.6 10*3/uL — ABNORMAL HIGH (ref 4.0–10.5)

## 2017-10-12 LAB — HIV ANTIBODY (ROUTINE TESTING W REFLEX): HIV Screen 4th Generation wRfx: NONREACTIVE

## 2017-10-12 LAB — CBG MONITORING, ED
Glucose-Capillary: 216 mg/dL — ABNORMAL HIGH (ref 65–99)
Glucose-Capillary: 222 mg/dL — ABNORMAL HIGH (ref 65–99)
Glucose-Capillary: 226 mg/dL — ABNORMAL HIGH (ref 65–99)
Glucose-Capillary: 256 mg/dL — ABNORMAL HIGH (ref 65–99)
Glucose-Capillary: 298 mg/dL — ABNORMAL HIGH (ref 65–99)

## 2017-10-12 LAB — HEMOGLOBIN A1C
Hgb A1c MFr Bld: 11.1 % — ABNORMAL HIGH (ref 4.8–5.6)
Mean Plasma Glucose: 271.87 mg/dL

## 2017-10-12 LAB — URINALYSIS, ROUTINE W REFLEX MICROSCOPIC
Bacteria, UA: NONE SEEN
Bilirubin Urine: NEGATIVE
Glucose, UA: >=500 mg/dL — AB
Ketones, ur: 80 mg/dL — AB
Leukocytes, UA: NEGATIVE
Nitrite: NEGATIVE
Protein, ur: 30 mg/dL — AB
Specific Gravity, Urine: 1.026 (ref 1.005–1.030)
Squamous Epithelial / HPF: NONE SEEN
pH: 5 (ref 5.0–8.0)

## 2017-10-12 LAB — RAPID URINE DRUG SCREEN, HOSP PERFORMED
Amphetamines: NOT DETECTED
Barbiturates: NOT DETECTED
Benzodiazepines: NOT DETECTED
Cocaine: NOT DETECTED
Opiates: NOT DETECTED
Tetrahydrocannabinol: NOT DETECTED

## 2017-10-12 LAB — TSH: TSH: 1.058 u[IU]/mL (ref 0.350–4.500)

## 2017-10-12 MED ORDER — SODIUM CHLORIDE 0.9 % IV SOLN
INTRAVENOUS | Status: AC
  Administered 2017-10-12: 1.7 [IU]/h via INTRAVENOUS
  Filled 2017-10-12: qty 1

## 2017-10-12 MED ORDER — SODIUM CHLORIDE 0.9 % IV SOLN: INTRAVENOUS | Status: DC

## 2017-10-12 MED ORDER — INSULIN GLARGINE 100 UNIT/ML ~~LOC~~ SOLN
20.0000 [IU] | Freq: Every day | SUBCUTANEOUS | Status: DC
  Administered 2017-10-12 – 2017-10-15 (×4): 20 [IU] via SUBCUTANEOUS
  Filled 2017-10-12 (×4): qty 0.2

## 2017-10-12 MED ORDER — INSULIN GLARGINE 100 UNIT/ML ~~LOC~~ SOLN
20.0000 [IU] | Freq: Every day | SUBCUTANEOUS | Status: DC
  Filled 2017-10-12 (×2): qty 0.2

## 2017-10-12 MED ORDER — POTASSIUM CHLORIDE CRYS ER 20 MEQ PO TBCR
40.0000 meq | EXTENDED_RELEASE_TABLET | Freq: Once | ORAL | Status: AC
  Administered 2017-10-12: 40 meq via ORAL
  Filled 2017-10-12: qty 2

## 2017-10-12 MED ORDER — CHLORHEXIDINE GLUCONATE 4 % EX LIQD
60.0000 mL | Freq: Once | CUTANEOUS | Status: AC
  Administered 2017-10-12: 4 via TOPICAL

## 2017-10-12 MED ORDER — CEFAZOLIN SODIUM-DEXTROSE 2-4 GM/100ML-% IV SOLN
2.0000 g | INTRAVENOUS | Status: DC
  Filled 2017-10-12 (×2): qty 100

## 2017-10-12 MED ORDER — INSULIN ASPART 100 UNIT/ML ~~LOC~~ SOLN
0.0000 [IU] | SUBCUTANEOUS | Status: DC
  Administered 2017-10-12: 2 [IU] via SUBCUTANEOUS
  Administered 2017-10-13 (×2): 3 [IU] via SUBCUTANEOUS
  Administered 2017-10-13: 5 [IU] via SUBCUTANEOUS
  Administered 2017-10-13: 2 [IU] via SUBCUTANEOUS
  Administered 2017-10-13: 3 [IU] via SUBCUTANEOUS
  Administered 2017-10-14 (×2): 2 [IU] via SUBCUTANEOUS

## 2017-10-12 MED ORDER — DEXTROSE-NACL 5-0.45 % IV SOLN
INTRAVENOUS | Status: AC
  Administered 2017-10-12: 11:00:00 via INTRAVENOUS

## 2017-10-12 MED ORDER — DEXTROSE 50 % IV SOLN: 25.0000 mL | INTRAVENOUS | Status: DC | PRN

## 2017-10-12 MED ORDER — INSULIN REGULAR BOLUS VIA INFUSION
0.0000 [IU] | Freq: Three times a day (TID) | INTRAVENOUS | Status: DC
  Administered 2017-10-12: 6 [IU] via INTRAVENOUS
  Filled 2017-10-12: qty 10

## 2017-10-12 MED ORDER — PROMETHAZINE HCL 25 MG/ML IJ SOLN
12.5000 mg | Freq: Four times a day (QID) | INTRAMUSCULAR | Status: DC | PRN
  Administered 2017-10-12: 12.5 mg via INTRAVENOUS
  Filled 2017-10-12: qty 1

## 2017-10-12 MED ORDER — SODIUM CHLORIDE 0.9 % IV SOLN
INTRAVENOUS | Status: DC
  Administered 2017-10-12 – 2017-10-13 (×2): via INTRAVENOUS

## 2017-10-12 NOTE — Progress Notes (Signed)
CRITICAL VALUE ALERT  Critical Value:  Potassium 2.7  Date & Time Notied:  10/12/17  Provider Notified:Yes. Dr. Thereasa Solo Orders Received/Actions taken: Awaiting.

## 2017-10-12 NOTE — Telephone Encounter (Signed)
Call report from [facility] for MRI right foot result read to Dr. Sharol Given by tech. Pt is in the ER currently at [facility]. Dr. 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

## 2017-10-12 NOTE — H&P (View-Only) (Signed)
Patient ID: [name] [name], male   DOB: [birth year], 46 y.o.   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  Patient has initially refused surgery.  We will plan for transtibial amputation tomorrow around noon.  Will plan for n.p.o. after midnight.  IV antibiotics to continue preoperatively.

## 2017-10-12 NOTE — ED Notes (Signed)
Patient transported to MRI 

## 2017-10-12 NOTE — Consult Note (Signed)
ORTHOPAEDIC CONSULTATION  REQUESTING PHYSICIAN: Rise Patience, MD  Chief Complaint: Foul-smelling necrotic ulcer plantar aspect right foot  HPI: [name] [name] is a 46 y.o. male who presents with several day history of necrotic ulcer plantar aspect of the right foot.  Patient states that he has had multiple ulcers on the bottom of his foot for over 4 years.  Patient states he is unaware of his hemoglobin A1c.  Past Medical History:  Diagnosis Date  . Diabetes mellitus without complication ([place])   . Hypertension    History reviewed. No pertinent surgical history. Social History   Socioeconomic History  . Marital status: Widowed    Spouse name: None  . Number of children: None  . Years of education: None  . Highest education level: None  Social Needs  . Financial resource strain: None  . Food insecurity - worry: None  . Food insecurity - inability: None  . Transportation needs - medical: None  . Transportation needs - non-medical: None  Occupational History  . None  Tobacco Use  . Smoking status: Former Research scientist (life sciences)  . Smokeless tobacco: Never Used  Substance and Sexual Activity  . Alcohol use: No    Frequency: Never  . Drug use: No  . Sexual activity: None  Other Topics Concern  . None  Social History Narrative  . None   Family History  Problem Relation Age of Onset  . Diabetes Mellitus II Mother    - negative except otherwise stated in the family history section No Known Allergies Prior to Admission medications   Not on File   Dg Abdomen Acute W/chest  Result Date: 10/11/2017 CLINICAL DATA:  46 y/o  M; 4 days of hiccups with vomiting. EXAM: DG ABDOMEN ACUTE W/ 1V CHEST COMPARISON:  None. FINDINGS: There is no evidence of dilated bowel loops or free intraperitoneal air. No radiopaque calculi or other significant radiographic abnormality is seen. Heart size and mediastinal contours are within normal limits. Both lungs are clear. IMPRESSION: Negative  abdominal radiographs.  No acute cardiopulmonary disease. Electronically Signed   By: Kristine Garbe M.D.   On: 10/11/2017 20:27   Dg Foot Complete Right  Result Date: 10/11/2017 CLINICAL DATA:  46 year old male with sepsis.  Right foot infection. EXAM: RIGHT FOOT COMPLETE - 3+ VIEW COMPARISON:  None. FINDINGS: There is no acute fracture or dislocation. The bones are osteopenic. Mild arthritic changes of the second MTP joint. No definite bone erosion or periosteal reaction to suggest osteomyelitis. There is diffuse soft tissue edema and scattered areas of soft tissue air primarily over the medial aspect of the tarsal region concerning for necrotizing fasciitis. Clinical correlation is recommended. IMPRESSION: 1. No acute fracture or dislocation. 2. No definite radiographic evidence of osteomyelitis. MRI or a WBC nuclear scan may provide better evaluation if there is high clinical concern for osteomyelitis. 3. Diffuse soft tissue swelling of the foot with air most consistent with necrotizing fasciitis. Clinical correlation is recommended. Electronically Signed   By: Anner Crete M.D.   On: 10/11/2017 20:32   - pertinent xrays, CT, MRI studies were reviewed and independently interpreted  Positive ROS: All other systems have been reviewed and were otherwise negative with the exception of those mentioned in the HPI and as above.  Physical Exam: General: Alert, no acute distress Psychiatric: Patient is competent for consent with normal mood and affect Lymphatic: No axillary or cervical lymphadenopathy Cardiovascular: No pedal edema Respiratory: No cyanosis, no use of accessory musculature GI:  No organomegaly, abdomen is soft and non-tender  Skin: Examination patient has a large necrotic ulcer on the plantar aspect of the right foot.  The ulcer is approximately 4 cm in diameter and probes to bone.   Neurologic: Patient does not have protective sensation bilateral lower  extremities.   MUSCULOSKELETAL:  Patient has a good dorsalis pedis pulse.  He has a foul-smelling ulcer on the plantar aspect of the right foot.  The radiographs shows air in the soft tissue beneath the ulcer.  This area does not track up the leg.  The leg has no crepitation there is chronic venous stasis changes.  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  Plan: Plan: Discussed with the patient that the optimal management would be for a below the knee amputation.  Patient states that he cannot consider this.  Will order an MRI scan to further evaluate the soft tissue and bony pathology of his foot.  Possible surgery Wednesday if patient will consent.  Thank you for the consult and the opportunity to see Mr. [name] [name], [place] [phone] 6:46 AM

## 2017-10-12 NOTE — Progress Notes (Signed)
[place] [facility] TEAM 1 - Stepdown/ICU TEAM  [name] [name]  IFO:[id number] DOB: [birth year] DOA: 10/11/2017 PCP: Patient, No Pcp Per    Brief Narrative:  46 y.o. male who has not been to a physician in years w/ a history of DM2 who presented to the ER with nausea vomiting and increasing discharge from a right foot wound.   In the ED Xrays revealed a possible necrotizing fasciitis of the right foot, and Ortho was consulted.  He also required an insulin gtt for severely uncontrolled DM.  Significant Events: 11/19 admit   Subjective: The patient is resting comfortably in bed.  He denies chest pain shortness of breath fevers chills nausea or vomiting at this time.  He tells me he has no questions concerning his treatment plan.  Assessment & Plan:  Sepsis due to necrotic ulcer on R foot  Ortho following - cont empiric abx - for transtibial amputation 11/21 per Dr. Sharol Given   Severely uncontrolled DM2 Bicarb is normal, as is anion gap - CBG much improved - wean off insulin gtt - follow CBG closely to maximize wound healing - A1c 11.1 - not on meds prior to admit   Elevated BP Unclear if he has true HTN or not but suspect he does - watch BP as acute illness improves  Hypokalemia  Likely due to a combination of malnutrition as well as IV insulin treatment - supplement and follow - check magnesium  Renal failure of unclear chronicity  No baseline labs available - follow w/ volume expansion and resolution of sepsis   Recent Labs  Lab 10/11/17 2320 10/12/17 0419 10/12/17 1014 10/12/17 1215 10/12/17 1429  CREATININE 1.44* 1.34* 1.19 1.18 1.23    DVT prophylaxis: SCDs Code Status: FULL CODE Family Communication: no family present at time of exam  Disposition Plan: SDU for now - to OR in AM   Consultants:  Ortho Sharol Given   Antimicrobials:  Clindamycin 11/19 > Zosyn 11/19 > Vancomycin 11/19  Objective: Blood pressure 136/69, pulse 89, temperature 98.7 F (37.1 C), temperature  source Oral, resp. rate (!) 22, height 6\' 5"  (1.956 m), weight 93.9 kg (207 lb), SpO2 99 %.  Intake/Output Summary (Last 24 hours) at 10/12/2017 1658 Last data filed at 10/12/2017 1123 Gross per 24 hour  Intake 4649 ml  Output 1250 ml  Net 3399 ml   Filed Weights   10/11/17 1756 10/11/17 1802  Weight: 93.9 kg (207 lb) 93.9 kg (207 lb)    Examination: General: No acute respiratory distress Lungs: Clear to auscultation bilaterally without wheezes or crackles Cardiovascular: Regular rate and rhythm without murmur gallop or rub normal S1 and S2 Abdomen: Nontender, nondistended, soft, bowel sounds positive, no rebound, no ascites, no appreciable mass Extremities: No significant cyanosis, clubbing, or edema bilateral lower extremities  CBC: Recent Labs  Lab 10/11/17 1816 10/12/17 0419  WBC 25.1* 21.6*  NEUTROABS 22.8*  --   HGB 13.8 11.5*  HCT 40.8 34.4*  MCV 82.1 81.9  PLT 275 676   Basic Metabolic Panel: Recent Labs  Lab 10/11/17 2320 10/12/17 0419 10/12/17 1014 10/12/17 1215 10/12/17 1429  NA 133* 137 136 138 136  K 3.3* 3.7 3.1* 2.9* 2.7*  CL 95* 96* 97* 97* 96*  CO2 22 25 25 27 28   GLUCOSE 355* 271* 233* 175* 171*  BUN 29* 30* 26* 26* 25*  CREATININE 1.44* 1.34* 1.19 1.18 1.23  CALCIUM 7.6* 8.0* 8.0* 8.2* 8.2*   GFR: Estimated Creatinine Clearance: 96.6 mL/min (by C-G  formula based on SCr of 1.23 mg/dL).  Liver Function Tests: Recent Labs  Lab 10/11/17 1816  AST 14*  ALT 10*  ALKPHOS 119  BILITOT 1.6*  PROT 8.4*  ALBUMIN 2.8*   Recent Labs  Lab 10/11/17 2009  LIPASE 28    HbA1C: Hgb A1c MFr Bld  Date/Time Value Ref Range Status  10/11/2017 11:20 PM 11.1 (H) 4.8 - 5.6 % Final    Comment:    (NOTE) Pre diabetes:          5.7%-6.4% Diabetes:              >6.4% Glycemic control for   <7.0% adults with diabetes     CBG: Recent Labs  Lab 10/12/17 0956 10/12/17 1125 10/12/17 1302 10/12/17 1424 10/12/17 1543  GLUCAP 226* 216* 177* 176*  201*    Recent Results (from the past 240 hour(s))  Culture, blood (routine x 2)     Status: None (Preliminary result)   Collection Time: 10/11/17  7:40 PM  Result Value Ref Range Status   Specimen Description BLOOD RIGHT ANTECUBITAL  Final   Special Requests   Final    BOTTLES DRAWN AEROBIC AND ANAEROBIC Blood Culture adequate volume   Culture NO GROWTH < 24 HOURS  Final   Report Status PENDING  Incomplete  Culture, blood (routine x 2)     Status: None (Preliminary result)   Collection Time: 10/11/17  7:45 PM  Result Value Ref Range Status   Specimen Description BLOOD LEFT ANTECUBITAL  Final   Special Requests   Final    BOTTLES DRAWN AEROBIC AND ANAEROBIC Blood Culture adequate volume   Culture NO GROWTH < 24 HOURS  Final   Report Status PENDING  Incomplete     Scheduled Meds: . insulin regular  0-10 Units Intravenous TID WC     LOS: 1 day   Cherene Altes, MD [facility] Office  [phone] Pager - Text Page per Shea Evans as per below:  On-Call/Text Page:      [email or web address]      password [email or web address]  If 7PM-7AM, please contact night-coverage [email or web address] Password [facility] 10/12/2017, 4:58 PM

## 2017-10-12 NOTE — ED Notes (Signed)
This RN unable to hang vancomycin that was ordered for 8 AM at this time as it is not available in ED. Will send message to pharmacy to send to 4N so it is ready on pt arrival.

## 2017-10-12 NOTE — Progress Notes (Signed)
Patient ID: [name] [name], male   DOB: Jun 05, 1971, 46 y.o.   MRN: [id number] Patient's MRI scan shows abscess osteomyelitis and inflammation of the fascia.  Patient has initially refused surgery.  We will plan for transtibial amputation tomorrow around noon.  Will plan for n.p.o. after midnight.  IV antibiotics to continue preoperatively.

## 2017-10-13 ENCOUNTER — Inpatient Hospital Stay (HOSPITAL_COMMUNITY): Payer: Self-pay | Admitting: Certified Registered"

## 2017-10-13 ENCOUNTER — Encounter (HOSPITAL_COMMUNITY): Payer: Self-pay | Admitting: Orthopedic Surgery

## 2017-10-13 ENCOUNTER — Encounter (HOSPITAL_COMMUNITY)
Admission: EM | Disposition: A | Discharge status: Skilled Nursing Facility | Payer: Self-pay | Source: Home / Self Care | Attending: Family Medicine

## 2017-10-13 DIAGNOSIS — M726 Necrotizing fasciitis: Secondary | ICD-10-CM

## 2017-10-13 DIAGNOSIS — A419 Sepsis, unspecified organism: Principal | ICD-10-CM

## 2017-10-13 HISTORY — PX: AMPUTATION: SHX166

## 2017-10-13 HISTORY — DX: Necrotizing fasciitis: M72.6

## 2017-10-13 LAB — COMPREHENSIVE METABOLIC PANEL
ALT: 13 U/L — ABNORMAL LOW (ref 17–63)
AST: 19 U/L (ref 15–41)
Albumin: 1.9 g/dL — ABNORMAL LOW (ref 3.5–5.0)
Alkaline Phosphatase: 87 U/L (ref 38–126)
Anion gap: 8 (ref 5–15)
BUN: 19 mg/dL (ref 6–20)
CO2: 29 mmol/L (ref 22–32)
Calcium: 8 mg/dL — ABNORMAL LOW (ref 8.9–10.3)
Chloride: 100 mmol/L — ABNORMAL LOW (ref 101–111)
Creatinine, Ser: 1.04 mg/dL (ref 0.61–1.24)
GFR calc Af Amer: >60 mL/min (ref >60)
GFR calc non Af Amer: >60 mL/min (ref >60)
Glucose, Bld: 104 mg/dL — ABNORMAL HIGH (ref 65–99)
Potassium: 2.9 mmol/L — ABNORMAL LOW (ref 3.5–5.1)
Sodium: 137 mmol/L (ref 135–145)
Total Bilirubin: 1.3 mg/dL — ABNORMAL HIGH (ref 0.3–1.2)
Total Protein: 6.1 g/dL — ABNORMAL LOW (ref 6.5–8.1)

## 2017-10-13 LAB — GLUCOSE, CAPILLARY
Glucose-Capillary: 101 mg/dL — ABNORMAL HIGH (ref 65–99)
Glucose-Capillary: 111 mg/dL — ABNORMAL HIGH (ref 65–99)
Glucose-Capillary: 118 mg/dL — ABNORMAL HIGH (ref 65–99)
Glucose-Capillary: 138 mg/dL — ABNORMAL HIGH (ref 65–99)
Glucose-Capillary: 178 mg/dL — ABNORMAL HIGH (ref 65–99)
Glucose-Capillary: 181 mg/dL — ABNORMAL HIGH (ref 65–99)
Glucose-Capillary: 200 mg/dL — ABNORMAL HIGH (ref 65–99)
Glucose-Capillary: 210 mg/dL — ABNORMAL HIGH (ref 65–99)

## 2017-10-13 LAB — MAGNESIUM: Magnesium: 2 mg/dL (ref 1.7–2.4)

## 2017-10-13 LAB — SURGICAL PCR SCREEN
MRSA, PCR: NEGATIVE
Staphylococcus aureus: NEGATIVE

## 2017-10-13 SURGERY — AMPUTATION BELOW KNEE
Anesthesia: General | Site: Leg Lower | Laterality: Right

## 2017-10-13 MED ORDER — ONDANSETRON HCL 4 MG PO TABS: 4.0000 mg | ORAL_TABLET | Freq: Four times a day (QID) | ORAL | Status: DC | PRN

## 2017-10-13 MED ORDER — FENTANYL CITRATE (PF) 100 MCG/2ML IJ SOLN
INTRAMUSCULAR | Status: AC
  Filled 2017-10-13: qty 2

## 2017-10-13 MED ORDER — SUCCINYLCHOLINE CHLORIDE 20 MG/ML IJ SOLN
INTRAMUSCULAR | Status: DC | PRN
  Administered 2017-10-13: 120 mg via INTRAVENOUS

## 2017-10-13 MED ORDER — ONDANSETRON HCL 4 MG/2ML IJ SOLN: 4.0000 mg | Freq: Four times a day (QID) | INTRAMUSCULAR | Status: DC | PRN

## 2017-10-13 MED ORDER — LIDOCAINE HCL (CARDIAC) 20 MG/ML IV SOLN
INTRAVENOUS | Status: DC | PRN
  Administered 2017-10-13: 80 mg via INTRAVENOUS

## 2017-10-13 MED ORDER — HYDROMORPHONE HCL 1 MG/ML IJ SOLN
1.0000 mg | INTRAMUSCULAR | Status: DC | PRN
  Administered 2017-10-13 – 2017-10-14 (×2): 1 mg via INTRAVENOUS
  Filled 2017-10-13 (×2): qty 1

## 2017-10-13 MED ORDER — METOCLOPRAMIDE HCL 5 MG/ML IJ SOLN
5.0000 mg | Freq: Three times a day (TID) | INTRAMUSCULAR | Status: DC | PRN
  Administered 2017-10-13 (×2): 5 mg via INTRAVENOUS
  Filled 2017-10-13: qty 2

## 2017-10-13 MED ORDER — FENTANYL CITRATE (PF) 100 MCG/2ML IJ SOLN
INTRAMUSCULAR | Status: AC
  Administered 2017-10-13: 50 ug via INTRAVENOUS
  Filled 2017-10-13: qty 2

## 2017-10-13 MED ORDER — METHOCARBAMOL 1000 MG/10ML IJ SOLN
500.0000 mg | Freq: Four times a day (QID) | INTRAVENOUS | Status: DC | PRN
  Filled 2017-10-13: qty 5

## 2017-10-13 MED ORDER — MIDAZOLAM HCL 5 MG/5ML IJ SOLN
INTRAMUSCULAR | Status: DC | PRN
  Administered 2017-10-13: 2 mg via INTRAVENOUS

## 2017-10-13 MED ORDER — OXYCODONE HCL 5 MG PO TABS
5.0000 mg | ORAL_TABLET | ORAL | Status: DC | PRN
  Administered 2017-10-13 – 2017-10-16 (×2): 5 mg via ORAL
  Filled 2017-10-13 (×4): qty 1

## 2017-10-13 MED ORDER — MIDAZOLAM HCL 2 MG/2ML IJ SOLN
INTRAMUSCULAR | Status: AC
Start: 2017-10-13 — End: ?
  Filled 2017-10-13: qty 2

## 2017-10-13 MED ORDER — METHOCARBAMOL 500 MG PO TABS
500.0000 mg | ORAL_TABLET | Freq: Four times a day (QID) | ORAL | Status: DC | PRN
  Administered 2017-10-14 – 2017-10-22 (×20): 500 mg via ORAL
  Filled 2017-10-13 (×20): qty 1

## 2017-10-13 MED ORDER — SODIUM CHLORIDE 0.9 % IV SOLN: INTRAVENOUS | Status: DC

## 2017-10-13 MED ORDER — ONDANSETRON HCL 4 MG/2ML IJ SOLN
INTRAMUSCULAR | Status: DC | PRN
  Administered 2017-10-13: 4 mg via INTRAVENOUS

## 2017-10-13 MED ORDER — POTASSIUM CHLORIDE 10 MEQ/100ML IV SOLN
10.0000 meq | INTRAVENOUS | Status: AC
  Administered 2017-10-13 (×2): 10 meq via INTRAVENOUS
  Filled 2017-10-13 (×2): qty 100

## 2017-10-13 MED ORDER — DEXAMETHASONE SODIUM PHOSPHATE 10 MG/ML IJ SOLN
INTRAMUSCULAR | Status: DC | PRN
  Administered 2017-10-13: 10 mg via INTRAVENOUS

## 2017-10-13 MED ORDER — POTASSIUM CHLORIDE 10 MEQ/100ML IV SOLN
10.0000 meq | INTRAVENOUS | Status: AC
  Administered 2017-10-13: 10 meq via INTRAVENOUS
  Filled 2017-10-13: qty 100

## 2017-10-13 MED ORDER — POLYETHYLENE GLYCOL 3350 17 G PO PACK: 17.0000 g | PACK | Freq: Every day | ORAL | Status: DC | PRN

## 2017-10-13 MED ORDER — OXYCODONE HCL 5 MG PO TABS
10.0000 mg | ORAL_TABLET | ORAL | Status: DC | PRN
  Administered 2017-10-13 – 2017-10-22 (×39): 10 mg via ORAL
  Filled 2017-10-13 (×39): qty 2

## 2017-10-13 MED ORDER — FENTANYL CITRATE (PF) 250 MCG/5ML IJ SOLN
INTRAMUSCULAR | Status: AC
  Filled 2017-10-13: qty 5

## 2017-10-13 MED ORDER — DOCUSATE SODIUM 100 MG PO CAPS
100.0000 mg | ORAL_CAPSULE | Freq: Two times a day (BID) | ORAL | Status: DC
  Administered 2017-10-14 – 2017-10-19 (×6): 100 mg via ORAL
  Filled 2017-10-13 (×10): qty 1

## 2017-10-13 MED ORDER — BISACODYL 10 MG RE SUPP: 10.0000 mg | Freq: Every day | RECTAL | Status: DC | PRN

## 2017-10-13 MED ORDER — LACTATED RINGERS IV SOLN
INTRAVENOUS | Status: DC
  Administered 2017-10-13: 09:00:00 via INTRAVENOUS

## 2017-10-13 MED ORDER — FENTANYL CITRATE (PF) 100 MCG/2ML IJ SOLN
INTRAMUSCULAR | Status: DC | PRN
  Administered 2017-10-13: 150 ug via INTRAVENOUS

## 2017-10-13 MED ORDER — MAGNESIUM CITRATE PO SOLN: 1.0000 | Freq: Once | ORAL | Status: DC | PRN

## 2017-10-13 MED ORDER — PROPOFOL 10 MG/ML IV BOLUS
INTRAVENOUS | Status: AC
  Filled 2017-10-13: qty 20

## 2017-10-13 MED ORDER — METOCLOPRAMIDE HCL 10 MG PO TABS: 5.0000 mg | ORAL_TABLET | Freq: Three times a day (TID) | ORAL | Status: DC | PRN

## 2017-10-13 MED ORDER — PROPOFOL 10 MG/ML IV BOLUS
INTRAVENOUS | Status: DC | PRN
  Administered 2017-10-13: 200 mg via INTRAVENOUS

## 2017-10-13 MED ORDER — POTASSIUM CHLORIDE 10 MEQ/100ML IV SOLN
10.0000 meq | INTRAVENOUS | Status: AC
  Administered 2017-10-13 (×2): 10 meq via INTRAVENOUS
  Filled 2017-10-13 (×3): qty 100

## 2017-10-13 MED ORDER — ACETAMINOPHEN 650 MG RE SUPP: 650.0000 mg | RECTAL | Status: DC | PRN

## 2017-10-13 MED ORDER — LACTATED RINGERS IV SOLN
INTRAVENOUS | Status: DC | PRN
  Administered 2017-10-13: 10:00:00 via INTRAVENOUS

## 2017-10-13 MED ORDER — ACETAMINOPHEN 325 MG PO TABS
650.0000 mg | ORAL_TABLET | ORAL | Status: DC | PRN
  Administered 2017-10-20: 650 mg via ORAL
  Filled 2017-10-13: qty 2

## 2017-10-13 MED ORDER — FENTANYL CITRATE (PF) 100 MCG/2ML IJ SOLN
25.0000 ug | INTRAMUSCULAR | Status: DC | PRN
Start: 2017-10-13 — End: 2017-10-13
  Administered 2017-10-13 (×3): 50 ug via INTRAVENOUS

## 2017-10-13 SURGICAL SUPPLY — 32 items
BLADE SAW RECIP 87.9 MT (BLADE) ×2 IMPLANT
BLADE SURG 21 STRL SS (BLADE) ×2 IMPLANT
BNDG COHESIVE 6X5 TAN STRL LF (GAUZE/BANDAGES/DRESSINGS) ×3 IMPLANT
BNDG GAUZE ELAST 4 BULKY (GAUZE/BANDAGES/DRESSINGS) ×3 IMPLANT
CANISTER WOUNDNEG PRESSURE 500 (CANNISTER) ×1 IMPLANT
COVER SURGICAL LIGHT HANDLE (MISCELLANEOUS) ×1 IMPLANT
CUFF TOURNIQUET SINGLE 34IN LL (TOURNIQUET CUFF) ×1 IMPLANT
CUFF TOURNIQUET SINGLE 44IN (TOURNIQUET CUFF) IMPLANT
DRAPE INCISE IOBAN 66X45 STRL (DRAPES) ×1 IMPLANT
DRAPE U-SHAPE 47X51 STRL (DRAPES) ×2 IMPLANT
DRSG VAC ATS MED SENSATRAC (GAUZE/BANDAGES/DRESSINGS) ×2 IMPLANT
ELECT REM PT RETURN 9FT ADLT (ELECTROSURGICAL) ×2
ELECTRODE REM PT RTRN 9FT ADLT (ELECTROSURGICAL) ×1 IMPLANT
GLOVE BIOGEL PI IND STRL 9 (GLOVE) ×1 IMPLANT
GLOVE BIOGEL PI INDICATOR 9 (GLOVE) ×1
GLOVE SURG ORTHO 9.0 STRL STRW (GLOVE) ×2 IMPLANT
GOWN STRL REUS W/ TWL XL LVL3 (GOWN DISPOSABLE) ×2 IMPLANT
GOWN STRL REUS W/TWL XL LVL3 (GOWN DISPOSABLE) ×4
KIT BASIN OR (CUSTOM PROCEDURE TRAY) ×2 IMPLANT
KIT ROOM TURNOVER OR (KITS) ×2 IMPLANT
MANIFOLD NEPTUNE II (INSTRUMENTS) ×1 IMPLANT
NS IRRIG 1000ML POUR BTL (IV SOLUTION) ×2 IMPLANT
PACK ORTHO EXTREMITY (CUSTOM PROCEDURE TRAY) ×2 IMPLANT
PAD ARMBOARD 7.5X6 YLW CONV (MISCELLANEOUS) ×2 IMPLANT
SPONGE LAP 18X18 X RAY DECT (DISPOSABLE) IMPLANT
STAPLER VISISTAT 35W (STAPLE) IMPLANT
STOCKINETTE IMPERVIOUS LG (DRAPES) ×2 IMPLANT
SUT ETHILON 2 0 PSLX (SUTURE) ×1 IMPLANT
SUT SILK 2 0 (SUTURE) ×2
SUT SILK 2-0 18XBRD TIE 12 (SUTURE) ×1 IMPLANT
SUT VIC AB 1 CTX 27 (SUTURE) IMPLANT
TOWEL OR 17X26 10 PK STRL BLUE (TOWEL DISPOSABLE) ×2 IMPLANT

## 2017-10-13 NOTE — Progress Notes (Signed)
2/2 CHG baths complete, gown and linen changed. Report provided to short stay RN. Patient currently infusing 2/3 IV K+ in RFA, Second IV site needs to be established. IV K+ sent to shorts stay via tube station in order to complete med order.Patient continues to request to speak with Dr. Sharol Given prior to surgery but agreed to complete everything neccessary for possible surgery and proceed to short stay. Patient transporting to short stay at this time. CCMD notified.

## 2017-10-13 NOTE — Progress Notes (Signed)
Patient refusing to sign consent for surgery tonight.Patient wants to speak to Dr.Duda in morning.Will report off to day shift nurse for MD to come speak to patient.

## 2017-10-13 NOTE — Anesthesia Preprocedure Evaluation (Addendum)
Anesthesia Evaluation  Patient identified by MRN, date of birth, ID band Patient awake    Reviewed: Allergy & Precautions, H&P , NPO status , Patient's Chart, lab work & pertinent test results  Airway Mallampati: III  TM Distance: >3 FB Neck ROM: full    Dental  (+) Poor Dentition, Dental Advisory Given, Chipped, Loose   Pulmonary former smoker,    Pulmonary exam normal breath sounds clear to auscultation       Cardiovascular hypertension, Pt. on medications (-) Past MI  Rhythm:regular Rate:Normal     Neuro/Psych negative neurological ROS  negative psych ROS   GI/Hepatic GERD  Poorly Controlled,  Endo/Other  diabetes, Type 2Hypokalemia, being repleted  Renal/GU ARFRenal disease     Musculoskeletal negative musculoskeletal ROS (+)   Abdominal   Peds  Hematology  (+) anemia ,   Anesthesia Other Findings   Reproductive/Obstetrics                            Anesthesia Physical Anesthesia Plan  ASA: IV  Anesthesia Plan: General   Post-op Pain Management:    Induction: Intravenous  PONV Risk Score and Plan: 3 and Ondansetron, Midazolam and Treatment may vary due to age or medical condition  Airway Management Planned: Oral ETT  Additional Equipment: None  Intra-op Plan:   Post-operative Plan: Extubation in OR  Informed Consent: I have reviewed the patients History and Physical, chart, labs and discussed the procedure including the risks, benefits and alternatives for the proposed anesthesia with the patient or authorized representative who has indicated his/her understanding and acceptance.   Dental Advisory Given  Plan Discussed with: CRNA  Anesthesia Plan Comments: ( )       Anesthesia Quick Evaluation

## 2017-10-13 NOTE — Interval H&P Note (Signed)
History and Physical Interval Note:  10/13/2017 6:28 AM  [name] [name]  has presented today for surgery, with the diagnosis of Osteomyelitis, Abscess Right Foot  The various methods of treatment have been discussed with the patient and family. After consideration of risks, benefits and other options for treatment, the patient has consented to  Procedure(s): RIGHT BELOW KNEE AMPUTATION (Right) as a surgical intervention .  The patient's history has been reviewed, patient examined, no change in status, stable for surgery.  I have reviewed the patient's chart and labs.  Questions were answered to the patient's satisfaction.     [name]

## 2017-10-13 NOTE — Progress Notes (Signed)
Patient potassium level 2.9 this morning.Patient remains NPO for surgery this morning.Text paged NP Arby Barrette. New orders received.

## 2017-10-13 NOTE — Op Note (Signed)
   Date of Surgery: 10/13/2017  INDICATIONS: Mr. Battiste is a 46 y.o.-year-old male who has gangrene and necrotizing fasciitis of the right foot.  Patient has been on appropriate IV antibiotics and presents at this time for limb salvage intervention for transtibial amputation.Marland Kitchen  PREOPERATIVE DIAGNOSIS: Gangrene osteomyelitis necrotizing fasciitis right foot  POSTOPERATIVE DIAGNOSIS: Same.  PROCEDURE: Transtibial amputation Application of Prevena wound VAC  SURGEON: Sharol Given, M.D.  ANESTHESIA:  general  IV FLUIDS AND URINE: See anesthesia.  ESTIMATED BLOOD LOSS: min mL.  COMPLICATIONS: None.  DESCRIPTION OF PROCEDURE: The patient was brought to the operating room and underwent a general anesthetic. After adequate levels of anesthesia were obtained patient's lower extremity was prepped using DuraPrep draped into a sterile field. A timeout was called. The foot was draped out of the sterile field with impervious stockinette. A transverse incision was made 11 cm distal to the tibial tubercle. This curved proximally and a large posterior flap was created. The tibia was transected 1 cm proximal to the skin incision. The fibula was transected just proximal to the tibial incision. The tibia was beveled anteriorly. A large posterior flap was created. The sciatic nerve was pulled cut and allowed to retract. The vascular bundles were suture ligated with 2-0 silk. The deep and superficial fascial layers were closed using #1 Vicryl. The skin was closed using staples and 2-0 nylon. The wound was covered with a Prevena wound VAC. There was a good suction fit. Patient was extubated taken to the PACU in stable condition.  Meridee Score, MD [place] 10:35 AM

## 2017-10-13 NOTE — Progress Notes (Signed)
Progress Note    [name] [name]  ZOX:[id number] DOB: Jul 02, 1971  DOA: 10/11/2017 PCP: Patient, No Pcp Per    Brief Narrative:   Chief complaint: F/U DM/right foot infection  Medical records reviewed and are as summarized below:  [name] [name] is an 46 y.o. male who has not been to a physician in years w/ a history of DM2 who presented to the ER with nausea vomiting and increasing discharge from a right foot wound.   In the ED Xrays revealed a possible necrotizing fasciitis of the right foot, and Ortho was consulted.  He also required an insulin gtt for severely uncontrolled DM.  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 Lactate has cleared. Continue vancomycin/Zosyn for 48 hours postoperatively. Discontinue clindamycin  Active Problems:   Uncontrolled type 2 diabetes mellitus with hyperglycemia (HCC) Hemoglobin A1c 11.1%. Patient was not on medications prior to admission. Initially managed with an insulin drip. Currently on Lantus 20 units daily and moderate scale SSI every 4 hours. CBGs 101-159.    ARF (acute renal failure) (HCC) Baseline creatinine unknown. Creatinine has improved with treatment of his sepsis.    Hypokalemia Persistent despite supplementation. No evidence of hypomagnesemia. Has received 3 runs of potassium already this morning, will order another 3 runs.   Body mass index is 24.55 kg/m.   Family Communication/Anticipated D/C date and plan/Code Status   DVT prophylaxis: SCDs. Code Status: Full Code.  Family Communication: Patient wore today. Disposition Plan: Home when stable postoperatively.   Medical Consultants:    Orthopedic Surgery   Anti-Infectives:    None  Subjective:   Patient in the OR.  Objective:    Vitals:   10/12/17 1605 10/12/17 2014 10/13/17 0001 10/13/17 0430  BP: 136/69 (!) 142/89 (!) 135/91 (!) 145/84  Pulse: 89 88 79 99  Resp: (!) 22 14 (!) 23 15  Temp: 98.7 F (37.1 C) 99 F (37.2 C) 99 F (37.2 C) 99.4 F (37.4 C)  TempSrc: Oral Oral Oral Oral  SpO2: 99% 99% 98% 97%  Weight:      Height:        Intake/Output Summary (Last 24 hours) at 10/13/2017 0830 Last data filed at 10/12/2017 2203 Gross per 24 hour  Intake 2263.17 ml  Output 300 ml  Net 1963.17 ml   Filed Weights   10/11/17 1756 10/11/17 1802  Weight: 93.9 kg (207 lb) 93.9 kg (207 lb)    Exam: Patient not examined today. In the OR.  Data Reviewed:   I have personally reviewed following labs and imaging studies:  Labs: Labs show the following:   Basic Metabolic Panel: Recent Labs  Lab 10/12/17 0419 10/12/17 1014 10/12/17 1215 10/12/17 1429 10/13/17 0426  NA 137 136 138 136 137  K 3.7 3.1* 2.9* 2.7* 2.9*  CL 96* 97* 97* 96* 100*  CO2 25 25 27 28 29   GLUCOSE 271* 233* 175* 171* 104*  BUN 30* 26* 26* 25* 19  CREATININE 1.34* 1.19 1.18 1.23 1.04  CALCIUM 8.0* 8.0* 8.2* 8.2* 8.0*  MG  --   --   --   --  2.0   GFR Estimated Creatinine Clearance: 114.2 mL/min (by C-G formula based on SCr of 1.04 mg/dL). Liver Function Tests: Recent Labs  Lab 10/11/17 1816 10/13/17 0426  AST  14* 19  ALT 10* 13*  ALKPHOS 119 87  BILITOT 1.6* 1.3*  PROT 8.4* 6.1*  ALBUMIN 2.8* 1.9*   Recent Labs  Lab 10/11/17 2009  LIPASE 28    CBC: Recent Labs  Lab 10/11/17 1816 10/12/17 0419  WBC 25.1* 21.6*  NEUTROABS 22.8*  --   HGB 13.8 11.5*  HCT 40.8 34.4*  MCV 82.1 81.9  PLT 275 251   CBG: Recent Labs  Lab 10/12/17 1712 10/12/17 2015 10/13/17 0009 10/13/17 0431 10/13/17 0820  GLUCAP 159* 127* 138* 101* 111*   Hgb A1c: Recent Labs    10/11/17 2320  HGBA1C 11.1*   Thyroid function studies: Recent Labs    10/11/17 2320  TSH 1.058   Sepsis Labs: Recent Labs  Lab 10/11/17 1816  10/11/17 1836 10/11/17 2030 10/12/17 0419 10/12/17 1014  WBC 25.1*  --   --  21.6*  --   LATICACIDVEN  --  3.03* 2.35*  --  1.5    Microbiology Recent Results (from the past 240 hour(s))  Culture, blood (routine x 2)     Status: None (Preliminary result)   Collection Time: 10/11/17  7:40 PM  Result Value Ref Range Status   Specimen Description BLOOD RIGHT ANTECUBITAL  Final   Special Requests   Final    BOTTLES DRAWN AEROBIC AND ANAEROBIC Blood Culture adequate volume   Culture NO GROWTH < 24 HOURS  Final   Report Status PENDING  Incomplete  Culture, blood (routine x 2)     Status: None (Preliminary result)   Collection Time: 10/11/17  7:45 PM  Result Value Ref Range Status   Specimen Description BLOOD LEFT ANTECUBITAL  Final   Special Requests   Final    BOTTLES DRAWN AEROBIC AND ANAEROBIC Blood Culture adequate volume   Culture NO GROWTH < 24 HOURS  Final   Report Status PENDING  Incomplete  Surgical pcr screen     Status: None   Collection Time: 10/12/17  8:40 PM  Result Value Ref Range Status   MRSA, PCR NEGATIVE NEGATIVE Final   Staphylococcus aureus NEGATIVE NEGATIVE Final    Comment: (NOTE) The Xpert SA Assay (FDA approved for NASAL specimens in patients 14 years of age and older), is one component of a comprehensive surveillance program. It is not intended to diagnose infection nor to guide or monitor treatment.     Procedures and diagnostic studies:  Mr Foot Right Wo Contrast  Result Date: 10/12/2017 CLINICAL DATA:  Several day history of foot ulcers on the plantar aspect of the right foot. Diabetic. EXAM: MRI OF THE RIGHT FOREFOOT WITHOUT CONTRAST TECHNIQUE: Multiplanar, multisequence MR imaging of the right foot was performed. No intravenous contrast was administered. COMPARISON:  Radiographs 10/11/2017 FINDINGS: Diffuse subcutaneous soft tissue swelling/ edema/ fluid consistent with cellulitis. There is also scattered gas in the subcutaneous tissues, mainly  along the plantar aspect of the foot likely due to open wounds or gangrene. Diffuse myofasciitis without discrete finding for pyomyositis. Some gas in the muscles concerning for necrotizing fasciitis. Small open wounds are noted on the plantar aspect of foot. One is located approximately overlying the area of the third metatarsal head. Another wound is located in the medial midfoot area on the plantar aspect with associated subcutaneous gas. MR findings consistent with septic arthritis at the third MTP joint and associated osteomyelitis involving the third metatarsal head and third proximal phalanx. There is also an overlying abscess dorsally and septic tenosynovitis along the plantar aspect of the  third toe. No other definite sites of osteomyelitis are identified. IMPRESSION: 1. Septic arthritis involving the third MTP joint with osteomyelitis in the proximal phalanx and third metatarsal head. Adjacent dorsal abscess measures 2.6 cm. There is also plantar septic tenosynovitis. 2. Plantar foot wounds and adjacent subcutaneous gas. 3. 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 4. Severe myofasciitis with findings suspicious for necrotizing fasciitis. These results will be called to the ordering clinician or representative by the Radiologist Assistant, and communication documented in the PACS or zVision Dashboard. Electronically Signed   By: Marijo Sanes M.D.   On: 10/12/2017 09:26   Dg Abdomen Acute W/chest  Result Date: 10/11/2017 CLINICAL DATA:  46 y/o  M; 4 days of hiccups with vomiting. EXAM: DG ABDOMEN ACUTE W/ 1V CHEST COMPARISON:  None. FINDINGS: There is no evidence of dilated bowel loops or free intraperitoneal air. No radiopaque calculi or other significant radiographic abnormality is seen. Heart size and mediastinal contours are within normal limits. Both lungs are clear. IMPRESSION: Negative abdominal radiographs.  No acute cardiopulmonary disease. Electronically Signed   By: Kristine Garbe M.D.   On: 10/11/2017 20:27   Dg Foot Complete Right  Result Date: 10/11/2017 CLINICAL DATA:  46 year old male with sepsis.  Right foot infection. EXAM: RIGHT FOOT COMPLETE - 3+ VIEW COMPARISON:  None. FINDINGS: There is no acute fracture or dislocation. The bones are osteopenic. Mild arthritic changes of the second MTP joint. No definite bone erosion or periosteal reaction to suggest osteomyelitis. There is diffuse soft tissue edema and scattered areas of soft tissue air primarily over the medial aspect of the tarsal region concerning for necrotizing fasciitis. Clinical correlation is recommended. IMPRESSION: 1. No acute fracture or dislocation. 2. No definite radiographic evidence of osteomyelitis. MRI or a WBC nuclear scan may provide better evaluation if there is high clinical concern for osteomyelitis. 3. Diffuse soft tissue swelling of the foot with air most consistent with necrotizing fasciitis. Clinical correlation is recommended. Electronically Signed   By: Anner Crete M.D.   On: 10/11/2017 20:32    Medications:   . insulin aspart  0-15 Units Subcutaneous Q4H  . insulin glargine  20 Units Subcutaneous QHS   Continuous Infusions: . sodium chloride 75 mL/hr at 10/12/17 1924  .  ceFAZolin (ANCEF) IV    . clindamycin (CLEOCIN) IV Stopped (10/13/17 0631)  . piperacillin-tazobactam (ZOSYN)  IV 3.375 g (10/13/17 0601)  . potassium chloride Stopped (10/13/17 0819)  . vancomycin Stopped (10/12/17 2201)     LOS: 2 days   Jacquelynn Cree  [facility] Pager [phone]. If unable to reach me by pager, please call my cell phone at [phone].  *Please refer to [email or web address], password [email or web address] to get updated schedule on who will round on this patient, as hospitalists switch teams weekly. If 7PM-7AM, please contact night-coverage at [email or web address], password [email or web address] for any overnight needs.  10/13/2017, 8:30 AM

## 2017-10-13 NOTE — Progress Notes (Signed)
   10/13/17 1800  Urine Characteristics  Time patient last voided or urinary catheter emptied 0845  Urinary Interventions Bladder scan  Bladder Scan Volume (mL) 620 mL   Per Dr. Rockne Menghini place indwelling urinary catheter until 0600 10/14/2017. Order entered. RN will continue to monitor patient.

## 2017-10-13 NOTE — Transfer of Care (Signed)
Immediate Anesthesia Transfer of Care Note  Patient: [name] [name]  Procedure(s) Performed: RIGHT BELOW KNEE AMPUTATION (Right Leg Lower)  Patient Location: PACU  Anesthesia Type:General  Level of Consciousness: awake, alert , patient cooperative and responds to stimulation  Airway & Oxygen Therapy: Patient Spontanous Breathing and Patient connected to face mask oxygen  Post-op Assessment: Report given to RN, Post -op Vital signs reviewed and stable and Patient moving all extremities X 4  Post vital signs: Reviewed and stable  Last Vitals:  Vitals:   10/13/17 0830 10/13/17 1045  BP: (!) 150/95   Pulse: 95 97  Resp: 18   Temp: 37 C 36.6 C  SpO2: 97%     Last Pain:  Vitals:   10/13/17 0830  TempSrc: Oral  PainSc:          Complications: No apparent anesthesia complications

## 2017-10-13 NOTE — Anesthesia Procedure Notes (Signed)
Procedure Name: Intubation Date/Time: 10/13/2017 10:02 AM Performed by: Glynda Jaeger, CRNA Pre-anesthesia Checklist: Patient identified, Patient being monitored, Timeout performed, Emergency Drugs available and Suction available Patient Re-evaluated:Patient Re-evaluated prior to induction Oxygen Delivery Method: Circle System Utilized Preoxygenation: Pre-oxygenation with 100% oxygen Induction Type: IV induction Ventilation: Mask ventilation without difficulty Laryngoscope Size: Mac and 4 Grade View: Grade II Tube type: Oral Tube size: 7.5 mm Number of attempts: 1 Airway Equipment and Method: Stylet Placement Confirmation: ETT inserted through vocal cords under direct vision,  positive ETCO2 and breath sounds checked- equal and bilateral Secured at: 23 cm Tube secured with: Tape Dental Injury: Teeth and Oropharynx as per pre-operative assessment  Comments: RSI with cricoid pressure

## 2017-10-13 NOTE — Anesthesia Postprocedure Evaluation (Signed)
Anesthesia Post Note  Patient: ARIEL WINGROVE  Procedure(s) Performed: RIGHT BELOW KNEE AMPUTATION (Right Leg Lower)     Patient location during evaluation: PACU Anesthesia Type: General Level of consciousness: awake and alert Pain management: pain level controlled Vital Signs Assessment: post-procedure vital signs reviewed and stable Respiratory status: spontaneous breathing, nonlabored ventilation and respiratory function stable Cardiovascular status: blood pressure returned to baseline and stable Postop Assessment: no apparent nausea or vomiting Anesthetic complications: no    Last Vitals:  Vitals:   10/13/17 1135 10/13/17 1357  BP: 135/89 132/85  Pulse: 90 82  Resp: 14 17  Temp:    SpO2: 99% 100%                 Audry Pili

## 2017-10-14 ENCOUNTER — Encounter (HOSPITAL_COMMUNITY): Payer: Self-pay | Admitting: Orthopedic Surgery

## 2017-10-14 LAB — CBC
HCT: 31.7 % — ABNORMAL LOW (ref 39.0–52.0)
Hemoglobin: 10.2 g/dL — ABNORMAL LOW (ref 13.0–17.0)
MCH: 26.4 pg (ref 26.0–34.0)
MCHC: 32.2 g/dL (ref 30.0–36.0)
MCV: 81.9 fL (ref 78.0–100.0)
Platelets: 239 10*3/uL (ref 150–400)
RBC: 3.87 MIL/uL — ABNORMAL LOW (ref 4.22–5.81)
RDW: 14.1 % (ref 11.5–15.5)
WBC: 14.1 10*3/uL — ABNORMAL HIGH (ref 4.0–10.5)

## 2017-10-14 LAB — BASIC METABOLIC PANEL
Anion gap: 7 (ref 5–15)
BUN: 19 mg/dL (ref 6–20)
CO2: 26 mmol/L (ref 22–32)
Calcium: 7.5 mg/dL — ABNORMAL LOW (ref 8.9–10.3)
Chloride: 101 mmol/L (ref 101–111)
Creatinine, Ser: 0.91 mg/dL (ref 0.61–1.24)
GFR calc Af Amer: >60 mL/min (ref >60)
GFR calc non Af Amer: >60 mL/min (ref >60)
Glucose, Bld: 154 mg/dL — ABNORMAL HIGH (ref 65–99)
Potassium: 3.6 mmol/L (ref 3.5–5.1)
Sodium: 134 mmol/L — ABNORMAL LOW (ref 135–145)

## 2017-10-14 LAB — GLUCOSE, CAPILLARY
Glucose-Capillary: 142 mg/dL — ABNORMAL HIGH (ref 65–99)
Glucose-Capillary: 150 mg/dL — ABNORMAL HIGH (ref 65–99)
Glucose-Capillary: 125 mg/dL — ABNORMAL HIGH (ref 65–99)
Glucose-Capillary: 187 mg/dL — ABNORMAL HIGH (ref 65–99)
Glucose-Capillary: 168 mg/dL — ABNORMAL HIGH (ref 65–99)

## 2017-10-14 MED ORDER — INSULIN ASPART 100 UNIT/ML ~~LOC~~ SOLN
0.0000 [IU] | Freq: Three times a day (TID) | SUBCUTANEOUS | Status: DC
  Administered 2017-10-14: 3 [IU] via SUBCUTANEOUS
  Administered 2017-10-15 – 2017-10-17 (×4): 2 [IU] via SUBCUTANEOUS

## 2017-10-14 MED ORDER — INSULIN ASPART 100 UNIT/ML ~~LOC~~ SOLN: 0.0000 [IU] | Freq: Every day | SUBCUTANEOUS | Status: DC

## 2017-10-14 MED ORDER — ALUM & MAG HYDROXIDE-SIMETH 200-200-20 MG/5ML PO SUSP
15.0000 mL | ORAL | Status: DC | PRN
  Administered 2017-10-14 (×2): 30 mL via ORAL
  Filled 2017-10-14 (×2): qty 30

## 2017-10-14 NOTE — Progress Notes (Signed)
Patient ID: [name] [name], male   DOB: 27-Nov-1970, 46 y.o.   MRN: [id number] Postoperative day 1 right transtibial amputation.  Patient was given instructions on working on knee extension exercises.  Anticipate discharge to skilled nursing.  Patient states that he still has nausea that he had prior to admission.

## 2017-10-14 NOTE — Progress Notes (Signed)
[place] TEAM 1 - Stepdown/ICU TEAM  [name] [name]  GEX:[id number] DOB: [birth year] DOA: 10/11/2017 PCP: Patient, No Pcp Per    Brief Narrative:  46 y.o. male who has not been to a physician in years w/ a history of DM2 who presented to the ER with nausea vomiting and increasing discharge from a right foot wound.   In the ED Xrays revealed a possible necrotizing fasciitis of the right foot, and Ortho was consulted.  He also required an insulin gtt for severely uncontrolled DM.  Significant Events: 11/19 admit  11/21 transtibial amputation on R   Subjective: Flat affect.  Has has some trouble w/ urinating off and on today per RN.  Denies cp or sob.    Assessment & Plan:  Sepsis due to necrotic ulcer on R foot - R foot osteomyelitis  Ortho following - s/p transtibial amputation 11/21 per Dr. Sharol Given - plan to stop abx tomorrow unless Ortho feels otherwise   Severely uncontrolled DM2 A1c 11.1 - not on meds prior to admit - CBG currently well controlled - no change in tx plan today   Elevated BP BP well controlled at this time   Hypokalemia  Likely due to a combination of malnutrition as well as IV insulin treatment - corrected   Renal failure of unclear chronicity  No baseline labs available - resolved w/ resolution of sepsis   Recent Labs  Lab 10/12/17 1014 10/12/17 1215 10/12/17 1429 10/13/17 0426 10/14/17 0421  CREATININE 1.19 1.18 1.23 1.04 0.91    DVT prophylaxis: SCDs Code Status: FULL CODE Family Communication: no family present at time of exam  Disposition Plan: transfer to ortho floor - no tele needed - ultimate dispo will be rehab facility   Consultants:  Ortho Sharol Given   Antimicrobials:  Clindamycin 11/19 > Zosyn 11/19 > Vancomycin 11/19 >  Objective: Blood pressure (!) 134/93, pulse 80, temperature 98.4 F (36.9 C), resp. rate 12, height 6' 5.01" (1.956 m), weight 93.9 kg (207 lb 0 oz), SpO2 100 %.  Intake/Output Summary (Last 24 hours) at  10/14/2017 1539 Last data filed at 10/14/2017 1300 Gross per 24 hour  Intake 2215 ml  Output 1200 ml  Net 1015 ml   Filed Weights   10/11/17 1756 10/11/17 1802 10/13/17 0913  Weight: 93.9 kg (207 lb) 93.9 kg (207 lb) 93.9 kg (207 lb 0 oz)    Examination: General: No acute respiratory distress Lungs: CTA B w/o wheezing  Cardiovascular: RRR w/o M or rub  Abdomen: NT/ND, soft, BS+ Extremities: No significant edema   CBC: Recent Labs  Lab 10/11/17 1816 10/12/17 0419 10/14/17 0421  WBC 25.1* 21.6* 14.1*  NEUTROABS 22.8*  --   --   HGB 13.8 11.5* 10.2*  HCT 40.8 34.4* 31.7*  MCV 82.1 81.9 81.9  PLT 275 251 010   Basic Metabolic Panel: Recent Labs  Lab 10/12/17 1014 10/12/17 1215 10/12/17 1429 10/13/17 0426 10/14/17 0421  NA 136 138 136 137 134*  K 3.1* 2.9* 2.7* 2.9* 3.6  CL 97* 97* 96* 100* 101  CO2 25 27 28 29 26   GLUCOSE 233* 175* 171* 104* 154*  BUN 26* 26* 25* 19 19  CREATININE 1.19 1.18 1.23 1.04 0.91  CALCIUM 8.0* 8.2* 8.2* 8.0* 7.5*  MG  --   --   --  2.0  --    GFR: Estimated Creatinine Clearance: 130.5 mL/min (by C-G formula based on SCr of 0.91 mg/dL).  Liver Function Tests: Recent Labs  Lab 10/11/17 1816 10/13/17 0426  AST 14* 19  ALT 10* 13*  ALKPHOS 119 87  BILITOT 1.6* 1.3*  PROT 8.4* 6.1*  ALBUMIN 2.8* 1.9*   Recent Labs  Lab 10/11/17 2009  LIPASE 28    HbA1C: Hgb A1c MFr Bld  Date/Time Value Ref Range Status  10/11/2017 11:20 PM 11.1 (H) 4.8 - 5.6 % Final    Comment:    (NOTE) Pre diabetes:          5.7%-6.4% Diabetes:              >6.4% Glycemic control for   <7.0% adults with diabetes     CBG: Recent Labs  Lab 10/13/17 1955 10/13/17 2315 10/14/17 0319 10/14/17 0828 10/14/17 1227  GLUCAP 210* 200* 142* 150* 125*    Recent Results (from the past 240 hour(s))  Culture, blood (routine x 2)     Status: None (Preliminary result)   Collection Time: 10/11/17  7:40 PM  Result Value Ref Range Status   Specimen  Description BLOOD RIGHT ANTECUBITAL  Final   Special Requests   Final    BOTTLES DRAWN AEROBIC AND ANAEROBIC Blood Culture adequate volume   Culture NO GROWTH 3 DAYS  Final   Report Status PENDING  Incomplete  Culture, blood (routine x 2)     Status: None (Preliminary result)   Collection Time: 10/11/17  7:45 PM  Result Value Ref Range Status   Specimen Description BLOOD LEFT ANTECUBITAL  Final   Special Requests   Final    BOTTLES DRAWN AEROBIC AND ANAEROBIC Blood Culture adequate volume   Culture NO GROWTH 3 DAYS  Final   Report Status PENDING  Incomplete  Surgical pcr screen     Status: None   Collection Time: 10/12/17  8:40 PM  Result Value Ref Range Status   MRSA, PCR NEGATIVE NEGATIVE Final   Staphylococcus aureus NEGATIVE NEGATIVE Final    Comment: (NOTE) The Xpert SA Assay (FDA approved for NASAL specimens in patients 46 years of age and older), is one component of a comprehensive surveillance program. It is not intended to diagnose infection nor to guide or monitor treatment.      Scheduled Meds: . docusate sodium  100 mg Oral BID  . insulin aspart  0-15 Units Subcutaneous Q4H  . insulin glargine  20 Units Subcutaneous QHS     LOS: 3 days   Cherene Altes, MD [facility] Office  [phone] Pager - Text Page per Amion as per below:  On-Call/Text Page:      [email or web address]      password [email or web address]  If 7PM-7AM, please contact night-coverage [email or web address] Password [email or web address] 10/14/2017, 3:39 PM

## 2017-10-14 NOTE — Evaluation (Signed)
Physical Therapy Evaluation Patient Details Name: [name] [name] MRN: [id number] DOB: [birth year] Today's Date: 10/14/2017   History of Present Illness  Colan Laymon Stricklandis a 46 y.o.[place] with history of diabetes mellitus type 2 has not been to a physician for many years presents to the ER with complaint of not feeling well no nausea vomiting or increasing discharge from the right foot. 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  Clinical Impression  Pt admitted with above. Pt with flat affect and still "dealing with" the R BKA. Pt able to tolerate ambulation this date with RW. Discussed techniques to manage R LE phantom limb pain and provided with amputee exercises. Acute PT to con't to follow.    Follow Up Recommendations Home health PT;Supervision/Assistance - 24 hour    Equipment Recommendations  3in1 (PT)(tub bench)    Recommendations for Other Services       Precautions / Restrictions Precautions Precautions: Fall Precaution Comments: wound vac to R residual limb Restrictions Weight Bearing Restrictions: No      Mobility  Bed Mobility Overal bed mobility: Needs Assistance Bed Mobility: Supine to Sit     Supine to sit: Min assist     General bed mobility comments: increased time  Transfers Overall transfer level: Needs assistance Equipment used: Rolling walker (2 wheeled) Transfers: Sit to/from Stand Sit to Stand: Min assist         General transfer comment: v/c's for safe hand placement, bed elevated minA to power up and to steady during transition of hands from bed to RW  Ambulation/Gait Ambulation/Gait assistance: Min assist;+2 safety/equipment Ambulation Distance (Feet): 20 Feet Assistive device: Rolling walker (2 wheeled) Gait Pattern/deviations: Step-to pattern Gait velocity: slow Gait velocity interpretation: Below normal  speed for age/gender General Gait Details: pt amb with max encouragement, pt able to clear foot. pt c/o fatigue  Stairs            Wheelchair Mobility    Modified Rankin (Stroke Patients Only)       Balance Overall balance assessment: Needs assistance Sitting-balance support: Feet supported;No upper extremity supported Sitting balance-Leahy Scale: Good     Standing balance support: Bilateral upper extremity supported Standing balance-Leahy Scale: Poor Standing balance comment: dependnet on RW due to R BKA                             Pertinent Vitals/Pain Pain Assessment: 0-10 Pain Score: 5  Pain Location: R limb Pain Descriptors / Indicators: (phantom limb pain) Pain Intervention(s): Monitored during session(educated on technique to manage it)    Home Living Family/patient expects to be discharged to:: Private residence Living Arrangements: Parent Available Help at Discharge: Family;Available 24 hours/day Type of Home: House Home Access: Stairs to enter Entrance Stairs-Rails: Can reach both Entrance Stairs-Number of Steps: 3 Home Layout: One level Home Equipment: Crutches      Prior Function Level of Independence: Independent               Hand Dominance   Dominant Hand: Right    Extremity/Trunk Assessment   Upper Extremity Assessment Upper Extremity Assessment: Overall WFL for tasks assessed    Lower Extremity Assessment Lower Extremity Assessment: RLE deficits/detail RLE Deficits / Details: R BKA, able to complete quad set    Cervical / Trunk Assessment Cervical / Trunk Assessment: Normal  Communication  Communication: No difficulties  Cognition Arousal/Alertness: Awake/alert Behavior During Therapy: Flat affect Overall Cognitive Status: Within Functional Limits for tasks assessed                                        General Comments      Exercises Amputee Exercises Quad Sets: AROM;Right;10  reps;Supine Gluteal Sets: AROM;Both;10 reps;Supine   Assessment/Plan    PT Assessment Patient needs continued PT services  PT Problem List Decreased strength;Decreased activity tolerance;Decreased balance;Decreased mobility;Decreased coordination;Decreased cognition;Decreased range of motion;Decreased knowledge of use of DME;Decreased safety awareness       PT Treatment Interventions DME instruction;Gait training;Stair training;Functional mobility training;Therapeutic activities;Therapeutic exercise;Balance training    PT Goals (Current goals can be found in the Care Plan section)  Acute Rehab PT Goals Patient Stated Goal: home PT Goal Formulation: With patient Time For Goal Achievement: 10/21/17 Potential to Achieve Goals: Good    Frequency Min 4X/week   Barriers to discharge        Co-evaluation               AM-PAC PT "6 Clicks" Daily Activity  Outcome Measure Difficulty turning over in bed (including adjusting bedclothes, sheets and blankets)?: A Little Difficulty moving from lying on back to sitting on the side of the bed? : A Little Difficulty sitting down on and standing up from a chair with arms (e.g., wheelchair, bedside commode, etc,.)?: A Little Help needed moving to and from a bed to chair (including a wheelchair)?: A Lot Help needed walking in hospital room?: A Lot Help needed climbing 3-5 steps with a railing? : A Lot 6 Click Score: 15    End of Session Equipment Utilized During Treatment: Gait belt Activity Tolerance: Patient tolerated treatment well Patient left: in chair;with call bell/phone within reach;with family/visitor present Nurse Communication: Mobility status PT Visit Diagnosis: Unsteadiness on feet (R26.81);Difficulty in walking, not elsewhere classified (R26.2)    Time: 6503-5465 PT Time Calculation (min) (ACUTE ONLY): 26 min   Charges:   PT Evaluation $PT Eval Moderate Complexity: 1 Mod PT Treatments $Gait Training: 8-22 mins    PT G Codes:        Kittie Plater, PT, DPT Pager #: [phone] Office #: [phone]   Elieser Tetrick M Lamanda Rudder 10/14/2017, 9:53 AM

## 2017-10-14 NOTE — Progress Notes (Signed)
Pharmacy Antibiotic Note  [name] [name] is a 46 y.o. male admitted on 10/11/2017 with sepsis and concern for cellulitis. Per EDP, concern for necrotizing fasciitis as well.  Pharmacy has been consulted for Vancomycin and zosyn dosing. Pt is afebrile and WBC is elevated at 14.1. SCr is stable. Pt now s/p transtibial amputation so infectious source has been removed. End date for antibiotics in place for tomorrow (~48 hours post-surgery).   Plan: Continue antibiotics as ordered through tomorrow Pharmacy will sign off as abx are stopping - if they need to continue, please re-consult [place]. Thank you for the consult!  Height: 6' 5.01" (195.6 cm) Weight: 207 lb 0 oz (93.9 kg) IBW/kg (Calculated) : 89.12  Temp (24hrs), Avg:98 F (36.7 C), Min:97.8 F (36.6 C), Max:98.1 F (36.7 C)  Recent Labs  Lab 10/11/17 1816 10/11/17 1836 10/11/17 2030  10/12/17 0419 10/12/17 1014 10/12/17 1215 10/12/17 1429 10/13/17 0426 10/14/17 0421  WBC 25.1*  --   --   --  21.6*  --   --   --   --  14.1*  CREATININE 1.66*  --   --    < > 1.34* 1.19 1.18 1.23 1.04 0.91  LATICACIDVEN  --  3.03* 2.35*  --   --  1.5  --   --   --   --    < > = values in this interval not displayed.    Estimated Creatinine Clearance: 130.5 mL/min (by C-G formula based on SCr of 0.91 mg/dL).    No Known Allergies   Salome Arnt, PharmD, BCPS Phone #: [phone] until 3pm All other times, call [place] [phone] 10/14/2017 8:49 AM

## 2017-10-15 LAB — CBC
HCT: 35.6 % — ABNORMAL LOW (ref 39.0–52.0)
Hemoglobin: 11.5 g/dL — ABNORMAL LOW (ref 13.0–17.0)
MCH: 27.3 pg (ref 26.0–34.0)
MCHC: 32.3 g/dL (ref 30.0–36.0)
MCV: 84.4 fL (ref 78.0–100.0)
Platelets: 302 10*3/uL (ref 150–400)
RBC: 4.22 MIL/uL (ref 4.22–5.81)
RDW: 14.7 % (ref 11.5–15.5)
WBC: 12.8 10*3/uL — ABNORMAL HIGH (ref 4.0–10.5)

## 2017-10-15 LAB — GLUCOSE, CAPILLARY
Glucose-Capillary: 85 mg/dL (ref 65–99)
Glucose-Capillary: 143 mg/dL — ABNORMAL HIGH (ref 65–99)
Glucose-Capillary: 113 mg/dL — ABNORMAL HIGH (ref 65–99)
Glucose-Capillary: 147 mg/dL — ABNORMAL HIGH (ref 65–99)

## 2017-10-15 LAB — BASIC METABOLIC PANEL WITH GFR
Anion gap: 8 (ref 5–15)
BUN: 15 mg/dL (ref 6–20)
CO2: 30 mmol/L (ref 22–32)
Calcium: 7.9 mg/dL — ABNORMAL LOW (ref 8.9–10.3)
Chloride: 101 mmol/L (ref 101–111)
Creatinine, Ser: 1.06 mg/dL (ref 0.61–1.24)
GFR calc Af Amer: >60 mL/min
GFR calc non Af Amer: >60 mL/min
Glucose, Bld: 97 mg/dL (ref 65–99)
Potassium: 3.5 mmol/L (ref 3.5–5.1)
Sodium: 139 mmol/L (ref 135–145)

## 2017-10-15 MED ORDER — INSULIN STARTER KIT- SYRINGES (ENGLISH)
1.0000 | Freq: Once | Status: AC
  Administered 2017-10-15: 1
  Filled 2017-10-15: qty 1

## 2017-10-15 MED ORDER — LIVING WELL WITH DIABETES BOOK
Freq: Once | Status: AC
  Administered 2017-10-15: 11:00:00
  Filled 2017-10-15: qty 1

## 2017-10-15 MED ORDER — SUCRALFATE 1 G PO TABS
1.0000 g | ORAL_TABLET | Freq: Three times a day (TID) | ORAL | Status: DC
  Administered 2017-10-15 – 2017-10-22 (×27): 1 g via ORAL
  Filled 2017-10-15 (×27): qty 1

## 2017-10-15 MED ORDER — FAMOTIDINE 20 MG PO TABS
20.0000 mg | ORAL_TABLET | Freq: Every day | ORAL | Status: DC
  Administered 2017-10-15 – 2017-10-22 (×8): 20 mg via ORAL
  Filled 2017-10-15 (×8): qty 1

## 2017-10-15 NOTE — NC FL2 (Signed)
[place] MEDICAID FL2 LEVEL OF CARE SCREENING TOOL     IDENTIFICATION  Patient Name: [name] [name] Birthdate: 08-17-1971 Sex: male Admission Date (Current Location): 10/11/2017  [facility] and [place] Number:  Producer, television/film/video and Address:  [facility] [place], [address] [place], [place] [address]      Provider Number: [id number]  Attending Physician Name and Address:  Rhetta Mura, MD  Relative Name and Phone Number:       Current Level of Care: Hospital Recommended Level of Care: Skilled Nursing Facility Prior Approval Number:    Date Approved/Denied:   PASRR Number: [id number]  Discharge Plan: SNF    Current Diagnoses: Patient Active Problem List   Diagnosis Date Noted  . Necrotizing fasciitis (HCC) 10/13/2017  . Sepsis (HCC) 10/11/2017  . Cellulitis of right foot 10/11/2017  . Uncontrolled type 2 diabetes mellitus with hyperglycemia (HCC) 10/11/2017  . ARF (acute renal failure) (HCC) 10/11/2017  . Nausea & vomiting 10/11/2017    Orientation RESPIRATION BLADDER Height & Weight     Self, Time, Situation, Place  Normal Continent Weight: 207 lb 0 oz (93.9 kg) Height:  6' 5.01" (195.6 cm)  BEHAVIORAL SYMPTOMS/MOOD NEUROLOGICAL BOWEL NUTRITION STATUS      Continent Diet(carb modified)  AMBULATORY STATUS COMMUNICATION OF NEEDS Skin   Extensive Assist Verbally Surgical wounds, Wound Vac(right leg closed incision; wound vac will be DC'd at discharge, see DC summary for dressing)                       Personal Care Assistance Level of Assistance  Bathing, Feeding, Dressing Bathing Assistance: Limited assistance Feeding assistance: Independent Dressing Assistance: Limited assistance     Functional Limitations Info  Sight, Speech, Hearing Sight Info: Adequate Hearing Info: Adequate Speech Info: Adequate    SPECIAL CARE FACTORS FREQUENCY  PT (By licensed PT), OT (By licensed OT)     PT Frequency: 5x/wk OT  Frequency: 5x/wk            Contractures Contractures Info: Not present    Additional Factors Info  Code Status, Allergies, Insulin Sliding Scale Code Status Info: Full Allergies Info: NKA   Insulin Sliding Scale Info: 0-15 units 3x/day with meals; 0-5 units daily at bedtime; Lantus 20 units daily at bedtime       Current Medications (10/15/2017):  This is the current hospital active medication list Current Facility-Administered Medications  Medication Dose Route Frequency Provider Last Rate Last Dose  . acetaminophen (TYLENOL) tablet 650 mg  650 mg Oral Q4H PRN Nadara Mustard, MD       Or  . acetaminophen (TYLENOL) suppository 650 mg  650 mg Rectal Q4H PRN Nadara Mustard, MD      . alum & mag hydroxide-simeth (MAALOX/MYLANTA) 200-200-20 MG/5ML suspension 15-30 mL  15-30 mL Oral Q4H PRN Lonia Blood, MD   30 mL at 10/14/17 1716  . bisacodyl (DULCOLAX) suppository 10 mg  10 mg Rectal Daily PRN Nadara Mustard, MD      . dextrose 50 % solution 25 mL  25 mL Intravenous PRN Eduard Clos, MD      . docusate sodium (COLACE) capsule 100 mg  100 mg Oral BID Nadara Mustard, MD   100 mg at 10/15/17 0914  . HYDROmorphone (DILAUDID) injection 1 mg  1 mg Intravenous Q2H PRN Nadara Mustard, MD   1 mg at 10/14/17 2346  . insulin aspart (novoLOG) injection 0-15  Units  0-15 Units Subcutaneous TID WC Lonia Blood, MD   2 Units at 10/15/17 1251  . insulin aspart (novoLOG) injection 0-5 Units  0-5 Units Subcutaneous QHS Jetty Duhamel T, MD      . insulin glargine (LANTUS) injection 20 Units  20 Units Subcutaneous QHS Lonia Blood, MD   20 Units at 10/14/17 2159  . magnesium citrate solution 1 Bottle  1 Bottle Oral Once PRN Nadara Mustard, MD      . methocarbamol (ROBAXIN) tablet 500 mg  500 mg Oral Q6H PRN Nadara Mustard, MD   500 mg at 10/15/17 1251   Or  . methocarbamol (ROBAXIN) 500 mg in dextrose 5 % 50 mL IVPB  500 mg Intravenous Q6H PRN Nadara Mustard, MD      .  metoCLOPramide (REGLAN) tablet 5-10 mg  5-10 mg Oral Q8H PRN Nadara Mustard, MD       Or  . metoCLOPramide (REGLAN) injection 5-10 mg  5-10 mg Intravenous Q8H PRN Nadara Mustard, MD   5 mg at 10/13/17 1653  . ondansetron (ZOFRAN) tablet 4 mg  4 mg Oral Q6H PRN Eduard Clos, MD       Or  . ondansetron [facility]) injection 4 mg  4 mg Intravenous Q6H PRN Eduard Clos, MD   4 mg at 10/15/17 1610  . oxyCODONE (Oxy IR/ROXICODONE) immediate release tablet 10 mg  10 mg Oral Q3H PRN Nadara Mustard, MD   10 mg at 10/15/17 1108  . oxyCODONE (Oxy IR/ROXICODONE) immediate release tablet 5 mg  5 mg Oral Q3H PRN Nadara Mustard, MD   5 mg at 10/13/17 1645  . polyethylene glycol (MIRALAX / GLYCOLAX) packet 17 g  17 g Oral Daily PRN Nadara Mustard, MD      . promethazine (PHENERGAN) injection 12.5 mg  12.5 mg Intravenous Q6H PRN Bodenheimer, Charles A, NP   12.5 mg at 10/12/17 2316  . vancomycin (VANCOCIN) 1,250 mg in sodium chloride 0.9 % 250 mL IVPB  1,250 mg Intravenous Q12H Rama, Maryruth Bun, MD   Stopped at 10/15/17 1044     Discharge Medications: Please see discharge summary for a list of discharge medications.  Relevant Imaging Results:  Relevant Lab Results:   Additional Information SS#: [id number]  Baldemar Lenis, LCSW

## 2017-10-15 NOTE — Progress Notes (Signed)
Patient ID: [name] [name], male   DOB: 10-19-1971, 46 y.o.   MRN: [id number] Patient is postoperative day 2 right transtibial amputation.  The wound VAC is clean and dry.  Patient states he does not have assistance at home.  Patient states that he can barely stand to urinate.  Patient will need discharge to skilled nursing or inpatient rehabilitation.  Patient would not be safe for discharge to home at this time.  Wound VAC dressing to be removed at time of discharge.

## 2017-10-15 NOTE — Progress Notes (Addendum)
[place] TEAM 1 - Stepdown/ICU TEAM  LORN ELEDGE  ION:[id number] DOB: [birth year] DOA: 10/11/2017 PCP: Patient, No Pcp Per    Brief Narrative:  46 y.o. male  DM2 who presented to the ER with nausea vomiting and increasing discharge from a right foot wound.   In the ED Xrays revealed a possible necrotizing fasciitis of the right foot, and Ortho was consulted.  He also required an insulin gtt for severely uncontrolled DM.  Significant Events: 11/19 admit  11/21 transtibial amputation on R   Subjective: Fair Some n today--long-standing--pain in chest and discomfort with burning  Assessment & Plan:  Sepsis due to necrotic ulcer on R foot - R foot osteomyelitis  Ortho following - s/p transtibial amputation 11/21 per Dr. Lajoyce Corners - All abx d/c 11/23 and observe  Severely uncontrolled DM2 A1c 11.1 -  Cont lantus 20 CBG 85-143 no change in tx plan today   Elevated BP BP well controlled at this time   Hypokalemia  Likely due to a combination of malnutrition as well as IV insulin treatment - corrected   Renal failure of unclear chronicity  No baseline labs available - resolved w/ resolution of sepsis   Recent Labs  Lab 10/12/17 1215 10/12/17 1429 10/13/17 0426 10/14/17 0421 10/15/17 0757  CREATININE 1.18 1.23 1.04 0.91 1.06    DVT prophylaxis: SCDs Code Status: FULL CODE Family Communication: no family present at time of exam  Disposition Plan: transfer to ortho floor - no tele needed - ultimate dispo will be rehab facility   Consultants:  Ortho Lajoyce Corners   Antimicrobials:  Clindamycin 11/19 >11/23 Zosyn 11/19 >11/23 Vancomycin 11/19 >11/23  Objective: Blood pressure (!) 149/95, pulse 83, temperature 97.6 F (36.4 C), temperature source Oral, resp. rate 16, height 6' 5.01" (1.956 m), weight 93.9 kg (207 lb 0 oz), SpO2 100 %.  Intake/Output Summary (Last 24 hours) at 10/15/2017 1537 Last data filed at 10/15/2017 1500 Gross per 24 hour  Intake 800 ml    Output 650 ml  Net 150 ml   Filed Weights   10/11/17 1756 10/11/17 1802 10/13/17 0913  Weight: 93.9 kg (207 lb) 93.9 kg (207 lb) 93.9 kg (207 lb 0 oz)    Examination: General: No acute respiratory distress Lungs: CTA B w/o wheezing  Cardiovascular: RRR w/o M or rub  Abdomen: NT/ND, soft, BS+ Extremities: No significant edema   CBC: Recent Labs  Lab 10/11/17 1816 10/12/17 0419 10/14/17 0421 10/15/17 0757  WBC 25.1* 21.6* 14.1* 12.8*  NEUTROABS 22.8*  --   --   --   HGB 13.8 11.5* 10.2* 11.5*  HCT 40.8 34.4* 31.7* 35.6*  MCV 82.1 81.9 81.9 84.4  PLT 275 251 239 302   Basic Metabolic Panel: Recent Labs  Lab 10/12/17 1215 10/12/17 1429 10/13/17 0426 10/14/17 0421 10/15/17 0757  NA 138 136 137 134* 139  K 2.9* 2.7* 2.9* 3.6 3.5  CL 97* 96* 100* 101 101  CO2 27 28 29 26 30   GLUCOSE 175* 171* 104* 154* 97  BUN 26* 25* 19 19 15   CREATININE 1.18 1.23 1.04 0.91 1.06  CALCIUM 8.2* 8.2* 8.0* 7.5* 7.9*  MG  --   --  2.0  --   --    GFR: Estimated Creatinine Clearance: 112.1 mL/min (by C-G formula based on SCr of 1.06 mg/dL).  Liver Function Tests: Recent Labs  Lab 10/11/17 1816 10/13/17 0426  AST 14* 19  ALT 10* 13*  ALKPHOS 119 87  BILITOT 1.6*  1.3*  PROT 8.4* 6.1*  ALBUMIN 2.8* 1.9*   Recent Labs  Lab 10/11/17 2009  LIPASE 28    HbA1C: Hgb A1c MFr Bld  Date/Time Value Ref Range Status  10/11/2017 11:20 PM 11.1 (H) 4.8 - 5.6 % Final    Comment:    (NOTE) Pre diabetes:          5.7%-6.4% Diabetes:              >6.4% Glycemic control for   <7.0% adults with diabetes     CBG: Recent Labs  Lab 10/14/17 1227 10/14/17 1704 10/14/17 2039 10/15/17 0649 10/15/17 1119  GLUCAP 125* 187* 168* 85 143*    Recent Results (from the past 240 hour(s))  Culture, blood (routine x 2)     Status: None (Preliminary result)   Collection Time: 10/11/17  7:40 PM  Result Value Ref Range Status   Specimen Description BLOOD RIGHT ANTECUBITAL  Final   Special  Requests   Final    BOTTLES DRAWN AEROBIC AND ANAEROBIC Blood Culture adequate volume   Culture NO GROWTH 4 DAYS  Final   Report Status PENDING  Incomplete  Culture, blood (routine x 2)     Status: None (Preliminary result)   Collection Time: 10/11/17  7:45 PM  Result Value Ref Range Status   Specimen Description BLOOD LEFT ANTECUBITAL  Final   Special Requests   Final    BOTTLES DRAWN AEROBIC AND ANAEROBIC Blood Culture adequate volume   Culture NO GROWTH 4 DAYS  Final   Report Status PENDING  Incomplete  Surgical pcr screen     Status: None   Collection Time: 10/12/17  8:40 PM  Result Value Ref Range Status   MRSA, PCR NEGATIVE NEGATIVE Final   Staphylococcus aureus NEGATIVE NEGATIVE Final    Comment: (NOTE) The Xpert SA Assay (FDA approved for NASAL specimens in patients 31 years of age and older), is one component of a comprehensive surveillance program. It is not intended to diagnose infection nor to guide or monitor treatment.      Scheduled Meds: . docusate sodium  100 mg Oral BID  . insulin aspart  0-15 Units Subcutaneous TID WC  . insulin aspart  0-5 Units Subcutaneous QHS  . insulin glargine  20 Units Subcutaneous QHS     LOS: 4 days   Pleas Koch, MD [facility]   If 7PM-7AM, please contact night-coverage [email or web address] Password [facility] 10/15/2017, 3:37 PM

## 2017-10-15 NOTE — Progress Notes (Signed)
Inpatient Diabetes Program Recommendations  AACE/ADA: New Consensus Statement on Inpatient Glycemic Control (2015)  Target Ranges:  Prepandial:   less than 140 mg/dL      Peak postprandial:   less than 180 mg/dL (1-2 hours)      Critically ill patients:  140 - 180 mg/dL   Lab Results  Component Value Date   GLUCAP 143 (H) 10/15/2017   HGBA1C 11.1 (H) 10/11/2017    Review of Glycemic ControlResults for AMANDEEP, NESMITH (MRN [id number]) as of 10/15/2017 13:25  Ref. Range 10/14/2017 12:27 10/14/2017 17:04 10/14/2017 20:39 10/15/2017 06:49 10/15/2017 11:19  Glucose-Capillary Latest Ref Range: 65 - 99 mg/dL 125 (H) 187 (H) 168 (H) 85 143 (H)    Diabetes history: Type 2 DM Outpatient Diabetes medications: None Current orders for Inpatient glycemic control:  Novolog moderate tid with meals and HS, Lantus 20 units q HS  Inpatient Diabetes Program Recommendations:    Visited with patient regarding elevated A1C and uncontrolled DM.  He states that he stopped taking DM meds several years ago.  Gave patient handout on A1C and goal blood sugars.  I explained that based on A1C results and current blood sugars, he will likely need insulin at discharge.  Patient was insistent that he will not taking insulin after discharge noting " I will not take a shot".  I explained that often with Type 2 diabetes, insulin is necessary to control blood sugars.  We also discussed that poorly controlled blood sugars can lead to complications such as infection, amputations, and others.  He states that the only way he will take insulin is if someone administers to him. He does live with his mother, however I am not sure that she would be able to assist with this.  Patient made little eye contact during our conversation and did not seem to recognize that dangers of uncontrolled blood sugars.  I told him that the RN would attempt to let him self-administer insulin with every injection so that he could feel more confident  with insulin administration.  Patient however told RN that he would not administer insulin.  Will need close outpatient support.  He also likely needs a meter and and to check blood sugars as well. Briefly discussed survival skills of DM and patient states "I just have too much going on".    May benefit from depression screening?   Thanks, Adah Perl, RN, BC-ADM Inpatient Diabetes Coordinator Pager [phone] (8a-5p)

## 2017-10-15 NOTE — Progress Notes (Signed)
Physical Therapy Treatment Patient Details Name: YOBANI SCHERTZER MRN: [id number] DOB: [birth year] Today's Date: 10/15/2017    History of Present Illness Ashawn Rinehart Stricklandis a 46 y.o.malewithmale with history of diabetes mellitus type 2 has not been to a physician for many years presents to the ER with complaint of not feeling well no nausea vomiting or increasing discharge from the right foot. 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    PT Comments    Pt progressing towards goals, however, continues to require max encouragement for participation. Pt unsteady with gait and easily fatigued, and required min A with RW and a chair follow. Required safety education as to why PT was required to guard pt during ambulation.  Pt reports he does not feel safe to go home and does not have anyone to assist him to go home. Updated recommendations to SNF at d/c to increase independence and safety with mobility. Will continue to follow acutely and progress mobility according to pt tolerance.   Follow Up Recommendations  SNF;Supervision/Assistance - 24 hour     Equipment Recommendations  3in1 (PT);Other (comment)(tub bench )    Recommendations for Other Services       Precautions / Restrictions Precautions Precautions: Fall Precaution Comments: wound vac to R residual limb Restrictions Weight Bearing Restrictions: Yes RLE Weight Bearing: Non weight bearing Other Position/Activity Restrictions: R BKA     Mobility  Bed Mobility Overal bed mobility: Needs Assistance Bed Mobility: Supine to Sit     Supine to sit: Supervision     General bed mobility comments: Supervision for safety.   Transfers Overall transfer level: Needs assistance Equipment used: Rolling walker (2 wheeled) Transfers: Sit to/from Stand Sit to Stand: Min assist;From elevated surface          General transfer comment: Required elevated surface and min A for lift assist and steadying. Verbal cues for safe hand placement.   Ambulation/Gait Ambulation/Gait assistance: Min assist;+2 safety/equipment Ambulation Distance (Feet): 30 Feet Assistive device: Rolling walker (2 wheeled) Gait Pattern/deviations: Step-to pattern Gait velocity: slow Gait velocity interpretation: Below normal speed for age/gender General Gait Details: Pt required max encouragement for ambulation and for foot clearance. Pt stating "I would walk if you would get out of my way" however, had to educate PT needed to be beside him to steady him. RW too low for pt, however, pt refusing higher height for RW. Max encouragement to keep going and limited distance secondary to fatigue.    Stairs            Wheelchair Mobility    Modified Rankin (Stroke Patients Only)       Balance Overall balance assessment: Needs assistance Sitting-balance support: Feet supported;No upper extremity supported Sitting balance-Leahy Scale: Good     Standing balance support: Bilateral upper extremity supported Standing balance-Leahy Scale: Poor Standing balance comment: dependent on RW due to R BKA                            Cognition Arousal/Alertness: Awake/alert Behavior During Therapy: Flat affect Overall Cognitive Status: Within Functional Limits for tasks assessed                                        Exercises Amputee Exercises  Quad Sets: AROM;Right;10 reps;Supine Gluteal Sets: AROM;Both;10 reps;Supine Straight Leg Raises: AROM;Right;10 reps    General Comments General comments (skin integrity, edema, etc.): Pt reporting he has no one at home to help support and assist with mobility. Educated about SNF and pt agreeable. Educated about not having pillow under BKA to prevent contractures, however, pt reporting he has to have one there.       Pertinent Vitals/Pain Pain Assessment:  Faces Faces Pain Scale: Hurts little more Pain Location: R limb Pain Descriptors / Indicators: (phantom ) Pain Intervention(s): Limited activity within patient's tolerance;Monitored during session;Repositioned    Home Living                      Prior Function            PT Goals (current goals can now be found in the care plan section) Acute Rehab PT Goals Patient Stated Goal: home PT Goal Formulation: With patient Time For Goal Achievement: 10/21/17 Potential to Achieve Goals: Good Progress towards PT goals: Progressing toward goals    Frequency    Min 3X/week      PT Plan Discharge plan needs to be updated;Frequency needs to be updated    Co-evaluation              AM-PAC PT "6 Clicks" Daily Activity  Outcome Measure  Difficulty turning over in bed (including adjusting bedclothes, sheets and blankets)?: A Little Difficulty moving from lying on back to sitting on the side of the bed? : A Little Difficulty sitting down on and standing up from a chair with arms (e.g., wheelchair, bedside commode, etc,.)?: Unable Help needed moving to and from a bed to chair (including a wheelchair)?: A Little Help needed walking in hospital room?: A Little Help needed climbing 3-5 steps with a railing? : Total 6 Click Score: 14    End of Session Equipment Utilized During Treatment: Gait belt Activity Tolerance: Patient limited by fatigue Patient left: in chair;with call bell/phone within reach Nurse Communication: Mobility status PT Visit Diagnosis: Unsteadiness on feet (R26.81);Difficulty in walking, not elsewhere classified (R26.2)     Time: 1000-1023 PT Time Calculation (min) (ACUTE ONLY): 23 min  Charges:  $Gait Training: 8-22 mins $Therapeutic Exercise: 8-22 mins                    G Codes:       Leighton Ruff, PT, DPT  Acute Rehabilitation Services  Pager: [phone]    Rudean Hitt 10/15/2017, 11:50 AM

## 2017-10-16 LAB — CULTURE, BLOOD (ROUTINE X 2)
Culture: NO GROWTH
Culture: NO GROWTH
Special Requests: ADEQUATE
Special Requests: ADEQUATE

## 2017-10-16 LAB — GLUCOSE, CAPILLARY
Glucose-Capillary: 100 mg/dL — ABNORMAL HIGH (ref 65–99)
Glucose-Capillary: 130 mg/dL — ABNORMAL HIGH (ref 65–99)
Glucose-Capillary: 126 mg/dL — ABNORMAL HIGH (ref 65–99)
Glucose-Capillary: 112 mg/dL — ABNORMAL HIGH (ref 65–99)

## 2017-10-16 MED ORDER — METFORMIN HCL 500 MG PO TABS
500.0000 mg | ORAL_TABLET | Freq: Two times a day (BID) | ORAL | Status: DC
  Administered 2017-10-16 – 2017-10-22 (×12): 500 mg via ORAL
  Filled 2017-10-16 (×12): qty 1

## 2017-10-16 MED ORDER — GLIPIZIDE 5 MG PO TABS
2.5000 mg | ORAL_TABLET | Freq: Every day | ORAL | Status: DC
  Administered 2017-10-16 – 2017-10-22 (×7): 2.5 mg via ORAL
  Filled 2017-10-16 (×7): qty 1

## 2017-10-16 NOTE — Progress Notes (Signed)
[name] [name]  WJX:[id number] DOB: 1971-09-03 DOA: 10/11/2017 PCP: Patient, No Pcp Per    Brief Narrative:  46 y.o. male  DM2 who presented to the ER with nausea vomiting and increasing discharge from a right foot wound.   In the ED Xrays revealed a possible necrotizing fasciitis of the right foot, and Ortho was consulted.  He also required an insulin gtt for severely uncontrolled DM.  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 Lajoyce Corners  All abx d/c 11/23 and no fever no further Rx needed now that source of infection gone Wound van and further wound management as per Ortho  Severely uncontrolled DM2 A1c 11.1 -  Doesn't wish to use insulin--had discussion with him with regards to the same and still refusing use of insulin start metformin 500 bid and glipizide 2 mg am Needs OP stratification and repeat A1c  Suspect will be non-compliant  Elevated BP BP well controlled at this time No meds needed  Hypokalemia  Likely due to a combination of malnutrition as well as IV insulin treatment - corrected   Renal failure of unclear chronicity  No baseline labs available - resolved w/ resolution of sepsis   Recent Labs  Lab 10/12/17 1215 10/12/17 1429 10/13/17 0426 10/14/17 0421 10/15/17 0757  CREATININE 1.18 1.23 1.04 0.91 1.06    DVT prophylaxis: SCDs Code Status: FULL CODE Family Communication:   Disposition Plan: transfer to ortho floor - no tele needed - ultimate dispo will be rehab facility   Consultants:  Ortho Lajoyce Corners   Antimicrobials:  Clindamycin 11/19 >11/23 Zosyn 11/19 >11/23 Vancomycin 11/19 >11/23  Objective: Blood pressure (!) 157/94, pulse 80, temperature 97.9 F (36.6 C), temperature source Oral, resp. rate 16, height 6' 5.01" (1.956 m), weight 93.9 kg (207 lb 0 oz),  SpO2 99 %.  Intake/Output Summary (Last 24 hours) at 10/16/2017 1005 Last data filed at 10/16/2017 0700 Gross per 24 hour  Intake 920 ml  Output 2000 ml  Net -1080 ml   Filed Weights   10/11/17 1756 10/11/17 1802 10/13/17 0913  Weight: 93.9 kg (207 lb) 93.9 kg (207 lb) 93.9 kg (207 lb 0 oz)    Examination: General: No acute respiratory distress, awake alert  Lungs: CTA B w/o wheezing  Cardiovascular: RRR w/o M or rub  Abdomen: NT/ND, soft, BS+ Extremities: No significant edema , Wound vac ion place RLE  CBC: Recent Labs  Lab 10/11/17 1816 10/12/17 0419 10/14/17 0421 10/15/17 0757  WBC 25.1* 21.6* 14.1* 12.8*  NEUTROABS 22.8*  --   --   --   HGB 13.8 11.5* 10.2* 11.5*  HCT 40.8 34.4* 31.7* 35.6*  MCV 82.1 81.9 81.9 84.4  PLT 275 251 239 302   Basic Metabolic Panel: Recent Labs  Lab 10/12/17 1215 10/12/17 1429 10/13/17 0426 10/14/17 0421 10/15/17 0757  NA 138 136 137 134* 139  K 2.9* 2.7* 2.9* 3.6 3.5  CL 97* 96* 100* 101 101  CO2 27 28 29 26 30   GLUCOSE 175* 171* 104* 154* 97  BUN 26* 25* 19 19 15   CREATININE 1.18 1.23 1.04 0.91 1.06  CALCIUM 8.2* 8.2* 8.0* 7.5* 7.9*  MG  --   --  2.0  --   --    GFR: Estimated Creatinine Clearance: 112.1 mL/min (by C-G  formula based on SCr of 1.06 mg/dL).  Liver Function Tests: Recent Labs  Lab 10/11/17 1816 10/13/17 0426  AST 14* 19  ALT 10* 13*  ALKPHOS 119 87  BILITOT 1.6* 1.3*  PROT 8.4* 6.1*  ALBUMIN 2.8* 1.9*   Recent Labs  Lab 10/11/17 2009  LIPASE 28    HbA1C: Hgb A1c MFr Bld  Date/Time Value Ref Range Status  10/11/2017 11:20 PM 11.1 (H) 4.8 - 5.6 % Final    Comment:    (NOTE) Pre diabetes:          5.7%-6.4% Diabetes:              >6.4% Glycemic control for   <7.0% adults with diabetes     CBG: Recent Labs  Lab 10/15/17 0649 10/15/17 1119 10/15/17 1622 10/15/17 2039 10/16/17 0630  GLUCAP 85 143* 113* 147* 100*    Recent Results (from the past 240 hour(s))  Culture, blood  (routine x 2)     Status: None (Preliminary result)   Collection Time: 10/11/17  7:40 PM  Result Value Ref Range Status   Specimen Description BLOOD RIGHT ANTECUBITAL  Final   Special Requests   Final    BOTTLES DRAWN AEROBIC AND ANAEROBIC Blood Culture adequate volume   Culture NO GROWTH 4 DAYS  Final   Report Status PENDING  Incomplete  Culture, blood (routine x 2)     Status: None (Preliminary result)   Collection Time: 10/11/17  7:45 PM  Result Value Ref Range Status   Specimen Description BLOOD LEFT ANTECUBITAL  Final   Special Requests   Final    BOTTLES DRAWN AEROBIC AND ANAEROBIC Blood Culture adequate volume   Culture NO GROWTH 4 DAYS  Final   Report Status PENDING  Incomplete  Surgical pcr screen     Status: None   Collection Time: 10/12/17  8:40 PM  Result Value Ref Range Status   MRSA, PCR NEGATIVE NEGATIVE Final   Staphylococcus aureus NEGATIVE NEGATIVE Final    Comment: (NOTE) The Xpert SA Assay (FDA approved for NASAL specimens in patients 48 years of age and older), is one component of a comprehensive surveillance program. It is not intended to diagnose infection nor to guide or monitor treatment.      Scheduled Meds: . docusate sodium  100 mg Oral BID  . famotidine  20 mg Oral Daily  . insulin aspart  0-15 Units Subcutaneous TID WC  . insulin aspart  0-5 Units Subcutaneous QHS  . insulin glargine  20 Units Subcutaneous QHS  . sucralfate  1 g Oral TID WC & HS     LOS: 5 days   Pleas Koch, MD [facility]   If 7PM-7AM, please contact night-coverage [email or web address] Password [email or web address] 10/16/2017, 10:05 AM

## 2017-10-17 LAB — CBC WITH DIFFERENTIAL/PLATELET
Basophils Absolute: 0 10*3/uL (ref 0.0–0.1)
Basophils Relative: 0 %
Eosinophils Absolute: 0.2 10*3/uL (ref 0.0–0.7)
Eosinophils Relative: 2 %
HCT: 35.2 % — ABNORMAL LOW (ref 39.0–52.0)
Hemoglobin: 11.3 g/dL — ABNORMAL LOW (ref 13.0–17.0)
Lymphocytes Relative: 28 %
Lymphs Abs: 2.8 10*3/uL (ref 0.7–4.0)
MCH: 26.8 pg (ref 26.0–34.0)
MCHC: 32.1 g/dL (ref 30.0–36.0)
MCV: 83.6 fL (ref 78.0–100.0)
Monocytes Absolute: 1 10*3/uL (ref 0.1–1.0)
Monocytes Relative: 10 %
Neutro Abs: 6 10*3/uL (ref 1.7–7.7)
Neutrophils Relative %: 60 %
Platelets: 300 10*3/uL (ref 150–400)
RBC: 4.21 MIL/uL — ABNORMAL LOW (ref 4.22–5.81)
RDW: 14.4 % (ref 11.5–15.5)
WBC: 10 10*3/uL (ref 4.0–10.5)

## 2017-10-17 LAB — RENAL FUNCTION PANEL
Albumin: 1.9 g/dL — ABNORMAL LOW (ref 3.5–5.0)
Anion gap: 7 (ref 5–15)
BUN: 8 mg/dL (ref 6–20)
CO2: 29 mmol/L (ref 22–32)
Calcium: 7.9 mg/dL — ABNORMAL LOW (ref 8.9–10.3)
Chloride: 98 mmol/L — ABNORMAL LOW (ref 101–111)
Creatinine, Ser: 0.89 mg/dL (ref 0.61–1.24)
GFR calc Af Amer: >60 mL/min (ref >60)
GFR calc non Af Amer: >60 mL/min (ref >60)
Glucose, Bld: 90 mg/dL (ref 65–99)
Phosphorus: 3 mg/dL (ref 2.5–4.6)
Potassium: 3.6 mmol/L (ref 3.5–5.1)
Sodium: 134 mmol/L — ABNORMAL LOW (ref 135–145)

## 2017-10-17 LAB — GLUCOSE, CAPILLARY
Glucose-Capillary: 108 mg/dL — ABNORMAL HIGH (ref 65–99)
Glucose-Capillary: 123 mg/dL — ABNORMAL HIGH (ref 65–99)
Glucose-Capillary: 71 mg/dL (ref 65–99)
Glucose-Capillary: 114 mg/dL — ABNORMAL HIGH (ref 65–99)
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
## 2017-10-17 NOTE — Progress Notes (Signed)
[name] [name]  ZOX:[id number] DOB: [birth year] DOA: 10/11/2017 PCP: Patient, No Pcp Per    Brief Narrative:  46 y.o. male  DM2 who presented to the ER with nausea vomiting and increasing discharge from a right foot wound.   In the ED Xrays revealed a possible necrotizing fasciitis of the right foot, and Ortho was consulted.  He also required an insulin gtt for severely uncontrolled DM.  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 Lajoyce Corners  All abx d/c 11/23 and no fever no further Rx needed now that source of infection gone Wound van and further wound management as per Ortho  Severely uncontrolled DM2 A1c 11.1 -  Doesn't wish to use insulin--had discussion with him with regards to the same and still refusing use of insulin--did d/c sliding scale coverage 11/25 start metformin 500 bid and glipizide 2 mg am Needs OP stratification and repeat A1c  Suspect will be non-compliant  Elevated BP BP well controlled at this time No meds needed  Hypokalemia  Likely due to a combination of malnutrition as well as IV insulin treatment - corrected   Renal failure of unclear chronicity  No baseline labs available - resolved w/ resolution of sepsis   Recent Labs  Lab 10/12/17 1429 10/13/17 0426 10/14/17 0421 10/15/17 0757 10/17/17 0431  CREATININE 1.23 1.04 0.91 1.06 0.89    DVT prophylaxis: SCDs Code Status: FULL CODE Family Communication:   Disposition Plan: ?ultimate dispo will be rehab facility --Social worker exploring available options  Consultants:  Ortho Lajoyce Corners   Antimicrobials:  Clindamycin 11/19 >11/23 Zosyn 11/19 >11/23 Vancomycin 11/19 >11/23  Objective: Blood pressure (!) 157/99, pulse 86, temperature 98.2 F (36.8 C), temperature source Oral, resp. rate 16, height 6' 5.01"  (1.956 m), weight 93.9 kg (207 lb 0 oz), SpO2 98 %.  Intake/Output Summary (Last 24 hours) at 10/17/2017 1422 Last data filed at 10/17/2017 0649 Gross per 24 hour  Intake 480 ml  Output 2150 ml  Net -1670 ml   Filed Weights   10/11/17 1756 10/11/17 1802 10/13/17 0913  Weight: 93.9 kg (207 lb) 93.9 kg (207 lb) 93.9 kg (207 lb 0 oz)    Examination: General: No acute respiratory distress, awake alert , flat affect Lungs: CTA B w/o wheezing  Cardiovascular: RRR w/o M or rub  Abdomen: NT/ND, soft, BS+ Extremities: No significant edema , Wound vac in place RLE  CBC: Recent Labs  Lab 10/11/17 1816 10/12/17 0419 10/14/17 0421 10/15/17 0757 10/17/17 0431  WBC 25.1* 21.6* 14.1* 12.8* 10.0  NEUTROABS 22.8*  --   --   --  6.0  HGB 13.8 11.5* 10.2* 11.5* 11.3*  HCT 40.8 34.4* 31.7* 35.6* 35.2*  MCV 82.1 81.9 81.9 84.4 83.6  PLT 275 251 239 302 300   Basic Metabolic Panel: Recent Labs  Lab 10/12/17 1429 10/13/17 0426 10/14/17 0421 10/15/17 0757 10/17/17 0431  NA 136 137 134* 139 134*  K 2.7* 2.9* 3.6 3.5 3.6  CL 96* 100* 101 101 98*  CO2 28 29 26 30 29   GLUCOSE 171* 104* 154* 97 90  BUN 25* 19 19 15 8   CREATININE 1.23 1.04 0.91 1.06 0.89  CALCIUM 8.2* 8.0* 7.5* 7.9* 7.9*  MG  --  2.0  --   --   --  PHOS  --   --   --   --  3.0   GFR: Estimated Creatinine Clearance: 133.5 mL/min (by C-G formula based on SCr of 0.89 mg/dL).  Liver Function Tests: Recent Labs  Lab 10/11/17 1816 10/13/17 0426 10/17/17 0431  AST 14* 19  --   ALT 10* 13*  --   ALKPHOS 119 87  --   BILITOT 1.6* 1.3*  --   PROT 8.4* 6.1*  --   ALBUMIN 2.8* 1.9* 1.9*   Recent Labs  Lab 10/11/17 2009  LIPASE 28    HbA1C: Hgb A1c MFr Bld  Date/Time Value Ref Range Status  10/11/2017 11:20 PM 11.1 (H) 4.8 - 5.6 % Final    Comment:    (NOTE) Pre diabetes:          5.7%-6.4% Diabetes:              >6.4% Glycemic control for   <7.0% adults with diabetes     CBG: Recent Labs  Lab  10/16/17 1137 10/16/17 1637 10/16/17 2010 10/17/17 0608 10/17/17 1140  GLUCAP 130* 126* 112* 108* 123*    Recent Results (from the past 240 hour(s))  Culture, blood (routine x 2)     Status: None   Collection Time: 10/11/17  7:40 PM  Result Value Ref Range Status   Specimen Description BLOOD RIGHT ANTECUBITAL  Final   Special Requests   Final    BOTTLES DRAWN AEROBIC AND ANAEROBIC Blood Culture adequate volume   Culture NO GROWTH 5 DAYS  Final   Report Status 10/16/2017 FINAL  Final  Culture, blood (routine x 2)     Status: None   Collection Time: 10/11/17  7:45 PM  Result Value Ref Range Status   Specimen Description BLOOD LEFT ANTECUBITAL  Final   Special Requests   Final    BOTTLES DRAWN AEROBIC AND ANAEROBIC Blood Culture adequate volume   Culture NO GROWTH 5 DAYS  Final   Report Status 10/16/2017 FINAL  Final  Surgical pcr screen     Status: None   Collection Time: 10/12/17  8:40 PM  Result Value Ref Range Status   MRSA, PCR NEGATIVE NEGATIVE Final   Staphylococcus aureus NEGATIVE NEGATIVE Final    Comment: (NOTE) The Xpert SA Assay (FDA approved for NASAL specimens in patients 17 years of age and older), is one component of a comprehensive surveillance program. It is not intended to diagnose infection nor to guide or monitor treatment.      Scheduled Meds: . docusate sodium  100 mg Oral BID  . famotidine  20 mg Oral Daily  . glipiZIDE  2.5 mg Oral QAC breakfast  . metFORMIN  500 mg Oral BID WC  . sucralfate  1 g Oral TID WC & HS     LOS: 6 days   Pleas Koch, MD [facility] (P) [phone]   If 7PM-7AM, please contact night-coverage [email or web address] Password [email or web address] 10/17/2017, 2:22 PM

## 2017-10-17 NOTE — Social Work (Signed)
CSW f/u with the following up with SNF's. [facility], [facility], [facility] and a few others have declined patient forSNF  placement. CSW awaiting to hear back from:  [facility], [facility] and [facility].   CSW will continue to follow up.  Elissa Hefty, LCSW Clinical Social Worker [phone]

## 2017-10-18 LAB — GLUCOSE, CAPILLARY
Glucose-Capillary: 159 mg/dL — ABNORMAL HIGH (ref 65–99)
Glucose-Capillary: 102 mg/dL — ABNORMAL HIGH (ref 65–99)
Glucose-Capillary: 135 mg/dL — ABNORMAL HIGH (ref 65–99)
Glucose-Capillary: 170 mg/dL — ABNORMAL HIGH (ref 65–99)

## 2017-10-18 NOTE — Progress Notes (Signed)
RN held pt colace because he refused it. Pt later stated at 1217pm that he would like to have it. RN gave pt colace

## 2017-10-18 NOTE — Progress Notes (Signed)
Inpatient Diabetes Program Recommendations  AACE/ADA: New Consensus Statement on Inpatient Glycemic Control (2015)  Target Ranges:  Prepandial:   less than 140 mg/dL      Peak postprandial:   less than 180 mg/dL (1-2 hours)      Critically ill patients:  140 - 180 mg/dL   Lab Results  Component Value Date   GLUCAP 159 (H) 10/18/2017   HGBA1C 11.1 (H) 10/11/2017    Review of Glycemic ControlResults for [name] [name], [name] [name] (MRN [id number]) as of 10/18/2017 10:47  Ref. Range 10/17/2017 06:08 10/17/2017 11:40 10/17/2017 16:29 10/17/2017 22:21 10/18/2017 06:46  Glucose-Capillary Latest Ref Range: 65 - 99 mg/dL 108 (H) 123 (H) 71 114 (H) 159 (H)   Diabetes history: DM 2 Current orders for Inpatient glycemic control:  Metformin 500 mg bid, Glucotrol 2.5 mg with breakfast  Inpatient Diabetes Program Recommendations:    Note patients refusal for insulin.  Blood sugars look fairly good today.  No further recommendations.   Thanks, Adah Perl, RN, BC-ADM Inpatient Diabetes Coordinator Pager [phone] (8a-5p)

## 2017-10-18 NOTE — Social Work (Signed)
CSW on SNF placement. Many difficulties with placement.  [facility] has no male beds this week per admissions. CSW f/u with [facility] placement.  CSW called Freda Munro at [facility] and left another message.    CSW will continue to follow up.  Elissa Hefty, LCSW Clinical Social Worker [phone]

## 2017-10-18 NOTE — Social Work (Signed)
CSW was advised by [facility] they no longer take LOG; CSW f/u with [facility]; [facility] advised that they are not accepting LOG's at this time; [facility] will not take LOG.  CSW will continue to follow up as [place] will get back to  CSW shortly.  Elissa Hefty, LCSW Clinical Social Worker [phone]

## 2017-10-18 NOTE — Progress Notes (Signed)
PT Cancellation Note  Patient Details Name: [name] [name] MRN: [id number] DOB: [birth year]   Cancelled Treatment:    Reason Eval/Treat Not Completed: Medical issues which prohibited therapy.  Very elevated BP and will wait to see how it is managed before PT visit.   Ramond Dial 10/18/2017, 9:05 AM   Mee Hives, PT MS Acute Rehab Dept. Number: [place] [facility] [place]

## 2017-10-18 NOTE — Progress Notes (Signed)
[name] [name]  EXB:[id number] DOB: [birth year] DOA: 10/11/2017 PCP: Patient, No Pcp Per    Brief Narrative:  46 y.o. male  DM2 who presented to the ER with nausea vomiting and increasing discharge from a right foot wound.   In the ED Xrays revealed a possible necrotizing fasciitis of the right foot, and Ortho was consulted.  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 Sharol Given  All abx d/c 11/23 and no fever no further Rx needed now that source of infection gone Wound vac probably to be removed later today in preparation for d/c soon  Severely uncontrolled DM2 A1c 11.1 -  Doesn't wish to use insulin--had discussion with him with regards to the same and still refusing use of insulin--did d/c sliding scale coverage 11/25 start metformin 500 bid and glipizide 2 mg am--had some low cbg am 11.26--advised need to et with Glipizide to prevent hypo Needs OP stratification and repeat A1c   Elevated BP BP well controlled at this time No meds needed  Hypokalemia  Likely due to a combination of malnutrition as well as IV insulin treatment - corrected   Renal failure of unclear chronicity  No baseline labs available - resolved w/ resolution of sepsis   Recent Labs  Lab 10/12/17 1429 10/13/17 0426 10/14/17 0421 10/15/17 0757 10/17/17 0431  CREATININE 1.23 1.04 0.91 1.06 0.89    DVT prophylaxis: SCDs Code Status: FULL CODE Family Communication:   Disposition Plan: Social worker exploring available options  Consultants:  Ortho Sharol Given   Antimicrobials:  Clindamycin 11/19 >11/23 Zosyn 11/19 >11/23 Vancomycin 11/19 >11/23  Objective: Blood pressure (!) 150/100, pulse  81, temperature 98.3 F (36.8 C), temperature source Oral, resp. rate 16, height 6' 5.01" (1.956 m), weight 93.9 kg (207 lb 0 oz), SpO2 98 %.  Intake/Output Summary (Last 24 hours) at 10/18/2017 1113 Last data filed at 10/17/2017 1600 Gross per 24 hour  Intake 240 ml  Output 0 ml  Net 240 ml   Filed Weights   10/11/17 1756 10/11/17 1802 10/13/17 0913  Weight: 93.9 kg (207 lb) 93.9 kg (207 lb) 93.9 kg (207 lb 0 oz)    Examination: General: flat affect moving around some in room Lungs: CTA B w/o wheezing  Cardiovascular: RRR w/o M or rub  Abdomen: NT/ND, soft, BS+ Extremities: No significant edema , Wound vac in place RLE  CBC: Recent Labs  Lab 10/11/17 1816 10/12/17 0419 10/14/17 0421 10/15/17 0757 10/17/17 0431  WBC 25.1* 21.6* 14.1* 12.8* 10.0  NEUTROABS 22.8*  --   --   --  6.0  HGB 13.8 11.5* 10.2* 11.5* 11.3*  HCT 40.8 34.4* 31.7* 35.6* 35.2*  MCV 82.1 81.9 81.9 84.4 83.6  PLT 275 251 239 302 102   Basic Metabolic Panel: Recent Labs  Lab 10/12/17 1429 10/13/17 0426 10/14/17 0421 10/15/17 0757 10/17/17 0431  NA 136 137 134* 139 134*  K 2.7* 2.9* 3.6 3.5 3.6  CL 96* 100* 101 101 98*  CO2 28 29 26 30 29   GLUCOSE 171* 104* 154* 97 90  BUN 25* 19 19 15 8   CREATININE 1.23 1.04 0.91 1.06 0.89  CALCIUM 8.2* 8.0* 7.5* 7.9*  7.9*  MG  --  2.0  --   --   --   PHOS  --   --   --   --  3.0   GFR: Estimated Creatinine Clearance: 133.5 mL/min (by C-G formula based on SCr of 0.89 mg/dL).  Liver Function Tests: Recent Labs  Lab 10/11/17 1816 10/13/17 0426 10/17/17 0431  AST 14* 19  --   ALT 10* 13*  --   ALKPHOS 119 87  --   BILITOT 1.6* 1.3*  --   PROT 8.4* 6.1*  --   ALBUMIN 2.8* 1.9* 1.9*   Recent Labs  Lab 10/11/17 2009  LIPASE 28    HbA1C: Hgb A1c MFr Bld  Date/Time Value Ref Range Status  10/11/2017 11:20 PM 11.1 (H) 4.8 - 5.6 % Final    Comment:    (NOTE) Pre diabetes:          5.7%-6.4% Diabetes:              >6.4% Glycemic control for    <7.0% adults with diabetes     CBG: Recent Labs  Lab 10/17/17 0608 10/17/17 1140 10/17/17 1629 10/17/17 2221 10/18/17 0646  GLUCAP 108* 123* 71 114* 159*    Recent Results (from the past 240 hour(s))  Culture, blood (routine x 2)     Status: None   Collection Time: 10/11/17  7:40 PM  Result Value Ref Range Status   Specimen Description BLOOD RIGHT ANTECUBITAL  Final   Special Requests   Final    BOTTLES DRAWN AEROBIC AND ANAEROBIC Blood Culture adequate volume   Culture NO GROWTH 5 DAYS  Final   Report Status 10/16/2017 FINAL  Final  Culture, blood (routine x 2)     Status: None   Collection Time: 10/11/17  7:45 PM  Result Value Ref Range Status   Specimen Description BLOOD LEFT ANTECUBITAL  Final   Special Requests   Final    BOTTLES DRAWN AEROBIC AND ANAEROBIC Blood Culture adequate volume   Culture NO GROWTH 5 DAYS  Final   Report Status 10/16/2017 FINAL  Final  Surgical pcr screen     Status: None   Collection Time: 10/12/17  8:40 PM  Result Value Ref Range Status   MRSA, PCR NEGATIVE NEGATIVE Final   Staphylococcus aureus NEGATIVE NEGATIVE Final    Comment: (NOTE) The Xpert SA Assay (FDA approved for NASAL specimens in patients 31 years of age and older), is one component of a comprehensive surveillance program. It is not intended to diagnose infection nor to guide or monitor treatment.      Scheduled Meds: . docusate sodium  100 mg Oral BID  . famotidine  20 mg Oral Daily  . glipiZIDE  2.5 mg Oral QAC breakfast  . metFORMIN  500 mg Oral BID WC  . sucralfate  1 g Oral TID WC & HS     LOS: 7 days   Verneita Griffes, MD [facility] (P) [phone]   If 7PM-7AM, please contact night-coverage [email or web address] Password [email or web address] 10/18/2017, 11:13 AM

## 2017-10-18 NOTE — Social Work (Signed)
CSW received a call back from Dr. Reynaldo Minium and CSW discussed barriers to getting patient placed SNF's locally. Dr. Reynaldo Minium gave Josem Kaufmann to send to [facility] [place] and [facility].  CSW will f/u.  Elissa Hefty, LCSW Clinical Social Worker [phone]

## 2017-10-19 LAB — GLUCOSE, CAPILLARY
Glucose-Capillary: 164 mg/dL — ABNORMAL HIGH (ref 65–99)
Glucose-Capillary: 144 mg/dL — ABNORMAL HIGH (ref 65–99)
Glucose-Capillary: 128 mg/dL — ABNORMAL HIGH (ref 65–99)
Glucose-Capillary: 175 mg/dL — ABNORMAL HIGH (ref 65–99)

## 2017-10-19 LAB — CBC WITH DIFFERENTIAL/PLATELET
Basophils Absolute: 0 10*3/uL (ref 0.0–0.1)
Basophils Relative: 0 %
Eosinophils Absolute: 0.3 10*3/uL (ref 0.0–0.7)
Eosinophils Relative: 2 %
HCT: 40.1 % (ref 39.0–52.0)
Hemoglobin: 12.8 g/dL — ABNORMAL LOW (ref 13.0–17.0)
Lymphocytes Relative: 20 %
Lymphs Abs: 2.9 10*3/uL (ref 0.7–4.0)
MCH: 27.1 pg (ref 26.0–34.0)
MCHC: 31.9 g/dL (ref 30.0–36.0)
MCV: 85 fL (ref 78.0–100.0)
Monocytes Absolute: 1.1 10*3/uL — ABNORMAL HIGH (ref 0.1–1.0)
Monocytes Relative: 8 %
Neutro Abs: 10.2 10*3/uL — ABNORMAL HIGH (ref 1.7–7.7)
Neutrophils Relative %: 70 %
Platelets: 386 10*3/uL (ref 150–400)
RBC: 4.72 MIL/uL (ref 4.22–5.81)
RDW: 14.4 % (ref 11.5–15.5)
WBC: 14.5 10*3/uL — ABNORMAL HIGH (ref 4.0–10.5)

## 2017-10-19 LAB — BASIC METABOLIC PANEL
Anion gap: 8 (ref 5–15)
BUN: 8 mg/dL (ref 6–20)
CO2: 30 mmol/L (ref 22–32)
Calcium: 8.8 mg/dL — ABNORMAL LOW (ref 8.9–10.3)
Chloride: 97 mmol/L — ABNORMAL LOW (ref 101–111)
Creatinine, Ser: 0.97 mg/dL (ref 0.61–1.24)
GFR calc Af Amer: >60 mL/min (ref >60)
GFR calc non Af Amer: >60 mL/min (ref >60)
Glucose, Bld: 166 mg/dL — ABNORMAL HIGH (ref 65–99)
Potassium: 4.2 mmol/L (ref 3.5–5.1)
Sodium: 135 mmol/L (ref 135–145)

## 2017-10-19 MED ORDER — HYDRALAZINE HCL 20 MG/ML IJ SOLN
5.0000 mg | Freq: Four times a day (QID) | INTRAMUSCULAR | Status: DC | PRN
  Administered 2017-10-20: 5 mg via INTRAVENOUS

## 2017-10-19 NOTE — Social Work (Addendum)
CSW faxed clinicals to [facility]. CSW will f/u for SNF placement.  CSW has been unsuccessful at getting pt placed in  Local SNF's. CSW will continue to follow up for SNF placement as patient is ready for discharge.  CSW also contacted financial counseling to review and follow up for insurance possibilities.  8:32 am CSW contacted admission staff at [facility] to discuss case and CSW faxed clinicals for review. CSW will f/u with Suanne Marker once reviewed.  CSW then called Tamika at [facility] to discuss [facility] placement.  CSW sent her documents to review thru hub. Tamika said she will review and get back to CSW.  8:48 am- CSW called back Heather at [facility] to see if she would reconsider. In the past, they are willing to take patients that have applied for medicaid and it is pending. CSW received a call back from financial counseling confirming that patient applied for medicaid and it has not been determined yet.  8:50am CSW received call from [place] at [facility] indicating that they cannot take LOG for placement.  Elissa Hefty, LCSW Clinical Social Worker [phone]

## 2017-10-19 NOTE — Progress Notes (Signed)
Physical Therapy Treatment Patient Details Name: [name] [name] Today's Date: 10/19/2017    History of Present Illness [name] [name] a 46 y.o.[place] with history of diabetes mellitus type 2 has not been to a physician for many years presents to the ER with complaint of not feeling well no nausea vomiting or increasing discharge from the right foot. 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    PT Comments    Pt was seen for evaluation of his mobility and did need to be encouraged to work.  Has some apparent anxiety about the process with rehab and progression and will expect him to need a lot of encouragement to accept the help he will need.  Has not received a committed acceptance for SNF and so will expect acute therapy to continue with gait and amputee exercise routine.   Follow Up Recommendations  SNF;Supervision/Assistance - 24 hour     Equipment Recommendations  3in1 (PT);Other (comment)(shower bench)    Recommendations for Other Services       Precautions / Restrictions Precautions Precautions: Fall Precaution Comments: wound vac to R residual limb Restrictions Weight Bearing Restrictions: Yes RLE Weight Bearing: Non weight bearing Other Position/Activity Restrictions: R BKA     Mobility  Bed Mobility Overal bed mobility: Needs Assistance Bed Mobility: Supine to Sit     Supine to sit: Supervision        Transfers Overall transfer level: Needs assistance Equipment used: Rolling walker (2 wheeled) Transfers: Sit to/from Stand Sit to Stand: Min guard;Min assist         General transfer comment: pt disliked being assisted despite having unsafe control of standing at times, needs contact support at all times  Ambulation/Gait Ambulation/Gait assistance: Min guard;Min assist;+2 physical assistance;+2  safety/equipment Ambulation Distance (Feet): 50 Feet Assistive device: Rolling walker (2 wheeled)   Gait velocity: slow Gait velocity interpretation: Below normal speed for age/gender General Gait Details: reported to PT that he did not like being assisted and continued to hold belt but educated him to allow help for gait   Stairs            Wheelchair Mobility    Modified Rankin (Stroke Patients Only)       Balance                                            Cognition Arousal/Alertness: Awake/alert Behavior During Therapy: Flat affect Overall Cognitive Status: Within Functional Limits for tasks assessed                                        Exercises General Exercises - Lower Extremity Ankle Circles/Pumps: AROM;Left;5 reps Quad Sets: AROM;Both;10 reps Gluteal Sets: AROM;Both;10 reps Hip ABduction/ADduction: AROM;10 reps;Left    General Comments        Pertinent Vitals/Pain Pain Assessment: Faces Faces Pain Scale: Hurts little more Pain Location: R limb Pain Descriptors / Indicators: Operative site guarding Pain Intervention(s): Limited activity within patient's tolerance;Monitored during session;Premedicated before session;Repositioned    Home Living                      Prior  Function            PT Goals (current goals can now be found in the care plan section) Acute Rehab PT Goals Patient Stated Goal: none stated Progress towards PT goals: Progressing toward goals    Frequency    Min 3X/week      PT Plan Current plan remains appropriate    Co-evaluation              AM-PAC PT "6 Clicks" Daily Activity  Outcome Measure  Difficulty turning over in bed (including adjusting bedclothes, sheets and blankets)?: A Little Difficulty moving from lying on back to sitting on the side of the bed? : A Little Difficulty sitting down on and standing up from a chair with arms (e.g., wheelchair, bedside  commode, etc,.)?: Unable Help needed moving to and from a bed to chair (including a wheelchair)?: A Little Help needed walking in hospital room?: A Little Help needed climbing 3-5 steps with a railing? : Total 6 Click Score: 14    End of Session Equipment Utilized During Treatment: Gait belt Activity Tolerance: Patient limited by fatigue Patient left: in chair;with call bell/phone within reach;in bed(had pt set up in chair then refused, back to bed) Nurse Communication: Mobility status PT Visit Diagnosis: Unsteadiness on feet (R26.81);Difficulty in walking, not elsewhere classified (R26.2)     Time: 2951-8841 PT Time Calculation (min) (ACUTE ONLY): 48 min  Charges:  $Gait Training: 8-22 mins $Therapeutic Exercise: 8-22 mins $Therapeutic Activity: 8-22 mins                    G Codes:  Functional Assessment Tool Used: AM-PAC 6 Clicks Basic Mobility     Ivar Drape 10/19/2017, 12:24 PM   Samul Dada, PT MS Acute Rehab Dept. Number: [facility] [id number] and [facility] [phone]

## 2017-10-19 NOTE — Social Work (Signed)
CSW f/u with [facility] and [facility]..  CSW has bed offer from [facility], patient does not want to go to the SNF because it is too far. CSW explained barriers and still f/u with other SNF's.  Elissa Hefty, LCSW Clinical Social Worker [phone]

## 2017-10-19 NOTE — Progress Notes (Signed)
[name] [name]  WUX:[id number] DOB: 1971/07/16 DOA: 10/11/2017 PCP: Patient, No Pcp Per    Brief Narrative:   46 y.o. male  DM2 who presented to the ER with nausea vomiting and increasing discharge from a right foot wound.   In the ED Xrays revealed a possible necrotizing fasciitis of the right foot, and Ortho was consulted.  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 Sharol Given  All abx d/c 11/23 and no fever no further Rx needed now that source of infection gone Wound vac removal at discretion of orthopedics  Severely uncontrolled DM2 A1c 11.1 -  Doesn't wish to use insulin--had discussion with him with regards to the same and still refusing use of insulin--did d/c sliding scale coverage 11/25 start metformin 500 bid and glipizide 2 mg am--hypoglycemia resolved-sugars 100 164 Needs OP stratification and repeat A1c   Elevated BP BP well controlled at this time No meds needed  Hypokalemia  Likely due to a combination of malnutrition as well as IV insulin treatment - corrected   Renal failure of unclear chronicity  No baseline labs available - resolved w/ resolution of sepsis   Recent Labs  Lab 10/13/17 0426 10/14/17 0421 10/15/17 0757 10/17/17 0431 10/19/17 0620  CREATININE 1.04 0.91 1.06 0.89 0.97    DVT prophylaxis: SCDs Code Status: FULL CODE Family Communication:   Disposition Plan: Social worker exploring available options  Consultants:  Ortho Sharol Given   Antimicrobials:  Clindamycin 11/19 >11/23 Zosyn 11/19 >11/23 Vancomycin 11/19 >11/23  Objective: Blood pressure (!) 149/95, pulse (!) 108, temperature 98.2 F (36.8 C), temperature source Oral,  resp. rate 20, height 6' 5.01" (1.956 m), weight 93.9 kg (207 lb 0 oz), SpO2 100 %.  Intake/Output Summary (Last 24 hours) at 10/19/2017 1400 Last data filed at 10/19/2017 2536 Gross per 24 hour  Intake 358 ml  Output 1875 ml  Net -1517 ml   Filed Weights   10/11/17 1756 10/11/17 1802 10/13/17 0913  Weight: 93.9 kg (207 lb) 93.9 kg (207 lb) 93.9 kg (207 lb 0 oz)    Examination: General: flat affect no distress Lungs: CTA B w/o wheezing  Cardiovascular: RRR w/o M or rub  Abdomen: NT/ND, soft, BS+ Extremities: No significant edema , Wound vac in place RLE  CBC: Recent Labs  Lab 10/14/17 0421 10/15/17 0757 10/17/17 0431 10/19/17 0620  WBC 14.1* 12.8* 10.0 14.5*  NEUTROABS  --   --  6.0 10.2*  HGB 10.2* 11.5* 11.3* 12.8*  HCT 31.7* 35.6* 35.2* 40.1  MCV 81.9 84.4 83.6 85.0  PLT 239 302 300 644   Basic Metabolic Panel: Recent Labs  Lab 10/13/17 0426 10/14/17 0421 10/15/17 0757 10/17/17 0431 10/19/17 0620  NA 137 134* 139 134* 135  K 2.9* 3.6 3.5 3.6 4.2  CL 100* 101 101 98* 97*  CO2 29 26 30 29 30   GLUCOSE 104* 154* 97 90 166*  BUN 19 19 15 8 8   CREATININE 1.04 0.91 1.06 0.89 0.97  CALCIUM 8.0* 7.5* 7.9* 7.9* 8.8*  MG 2.0  --   --   --   --   PHOS  --   --   --  3.0  --    GFR: Estimated Creatinine Clearance: 122.5 mL/min (by C-G formula based on SCr of 0.97 mg/dL).  Liver Function Tests: Recent Labs  Lab 10/13/17 0426 10/17/17 0431  AST 19  --   ALT 13*  --   ALKPHOS 87  --   BILITOT 1.3*  --   PROT 6.1*  --   ALBUMIN 1.9* 1.9*   No results for input(s): LIPASE, AMYLASE in the last 168 hours.  HbA1C: Hgb A1c MFr Bld  Date/Time Value Ref Range Status  10/11/2017 11:20 PM 11.1 (H) 4.8 - 5.6 % Final    Comment:    (NOTE) Pre diabetes:          5.7%-6.4% Diabetes:              >6.4% Glycemic control for   <7.0% adults with diabetes     CBG: Recent Labs  Lab 10/18/17 0646 10/18/17 1142 10/18/17 1735 10/18/17 2209 10/19/17 0625  GLUCAP  159* 102* 135* 170* 164*    Recent Results (from the past 240 hour(s))  Culture, blood (routine x 2)     Status: None   Collection Time: 10/11/17  7:40 PM  Result Value Ref Range Status   Specimen Description BLOOD RIGHT ANTECUBITAL  Final   Special Requests   Final    BOTTLES DRAWN AEROBIC AND ANAEROBIC Blood Culture adequate volume   Culture NO GROWTH 5 DAYS  Final   Report Status 10/16/2017 FINAL  Final  Culture, blood (routine x 2)     Status: None   Collection Time: 10/11/17  7:45 PM  Result Value Ref Range Status   Specimen Description BLOOD LEFT ANTECUBITAL  Final   Special Requests   Final    BOTTLES DRAWN AEROBIC AND ANAEROBIC Blood Culture adequate volume   Culture NO GROWTH 5 DAYS  Final   Report Status 10/16/2017 FINAL  Final  Surgical pcr screen     Status: None   Collection Time: 10/12/17  8:40 PM  Result Value Ref Range Status   MRSA, PCR NEGATIVE NEGATIVE Final   Staphylococcus aureus NEGATIVE NEGATIVE Final    Comment: (NOTE) The Xpert SA Assay (FDA approved for NASAL specimens in patients 30 years of age and older), is one component of a comprehensive surveillance program. It is not intended to diagnose infection nor to guide or monitor treatment.      Scheduled Meds: . famotidine  20 mg Oral Daily  . glipiZIDE  2.5 mg Oral QAC breakfast  . metFORMIN  500 mg Oral BID WC  . sucralfate  1 g Oral TID WC & HS     LOS: 8 days   Verneita Griffes, MD [facility] (P) [phone]   If 7PM-7AM, please contact night-coverage [email or web address] Password [email or web address] 10/19/2017, 2:00 PM

## 2017-10-19 NOTE — Social Work (Signed)
CSW called Debbie at [facility] again. She indicated that she will review clinicals as she had the clinicals in another area and did not get to yet.  CSW will continue to follow up.  Elissa Hefty, LCSW Clinical Social Worker [phone]

## 2017-10-20 ENCOUNTER — Inpatient Hospital Stay (HOSPITAL_COMMUNITY): Payer: Self-pay

## 2017-10-20 DIAGNOSIS — R262 Difficulty in walking, not elsewhere classified: Secondary | ICD-10-CM

## 2017-10-20 LAB — SEDIMENTATION RATE: Sed Rate: 75 mm/hr — ABNORMAL HIGH (ref 0–16)

## 2017-10-20 LAB — BASIC METABOLIC PANEL
Anion gap: 8 (ref 5–15)
BUN: 9 mg/dL (ref 6–20)
CO2: 26 mmol/L (ref 22–32)
Calcium: 8.6 mg/dL — ABNORMAL LOW (ref 8.9–10.3)
Chloride: 97 mmol/L — ABNORMAL LOW (ref 101–111)
Creatinine, Ser: 1 mg/dL (ref 0.61–1.24)
GFR calc Af Amer: >60 mL/min (ref >60)
GFR calc non Af Amer: >60 mL/min (ref >60)
Glucose, Bld: 229 mg/dL — ABNORMAL HIGH (ref 65–99)
Potassium: 4.2 mmol/L (ref 3.5–5.1)
Sodium: 131 mmol/L — ABNORMAL LOW (ref 135–145)

## 2017-10-20 LAB — CBC
HCT: 39.2 % (ref 39.0–52.0)
Hemoglobin: 12.5 g/dL — ABNORMAL LOW (ref 13.0–17.0)
MCH: 26.9 pg (ref 26.0–34.0)
MCHC: 31.9 g/dL (ref 30.0–36.0)
MCV: 84.3 fL (ref 78.0–100.0)
Platelets: 353 10*3/uL (ref 150–400)
RBC: 4.65 MIL/uL (ref 4.22–5.81)
RDW: 14.4 % (ref 11.5–15.5)
WBC: 14.5 10*3/uL — ABNORMAL HIGH (ref 4.0–10.5)

## 2017-10-20 LAB — GLUCOSE, CAPILLARY
Glucose-Capillary: 148 mg/dL — ABNORMAL HIGH (ref 65–99)
Glucose-Capillary: 168 mg/dL — ABNORMAL HIGH (ref 65–99)
Glucose-Capillary: 125 mg/dL — ABNORMAL HIGH (ref 65–99)
Glucose-Capillary: 148 mg/dL — ABNORMAL HIGH (ref 65–99)

## 2017-10-20 LAB — PROCALCITONIN: Procalcitonin: <0.1 ng/mL

## 2017-10-20 LAB — LACTIC ACID, PLASMA: Lactic Acid, Venous: 1.5 mmol/L (ref 0.5–1.9)

## 2017-10-20 LAB — C-REACTIVE PROTEIN: CRP: 1.9 mg/dL — ABNORMAL HIGH (ref <1.0)

## 2017-10-20 MED ORDER — SODIUM CHLORIDE 0.9 % IV SOLN
INTRAVENOUS | Status: DC
  Administered 2017-10-20 – 2017-10-21 (×3): via INTRAVENOUS

## 2017-10-20 MED ORDER — AMLODIPINE BESYLATE 5 MG PO TABS
5.0000 mg | ORAL_TABLET | Freq: Every day | ORAL | Status: DC
  Administered 2017-10-20: 5 mg via ORAL
  Filled 2017-10-20: qty 1

## 2017-10-20 MED ORDER — HYDRALAZINE HCL 20 MG/ML IJ SOLN
INTRAMUSCULAR | Status: AC
  Filled 2017-10-20: qty 1

## 2017-10-20 NOTE — Social Work (Addendum)
CSW contacted Suanne Marker at [facility]. She has agreed to accept patient and will f/u on special bed given his height.   CSW received a bed offer from [facility] at [facility].  CSW will discuss bed offers with patient.  CSW will f/u on dc summary.  Elissa Hefty, LCSW Clinical Social Worker [phone]

## 2017-10-20 NOTE — Consult Note (Signed)
[facility] Nurse wound consult note Reason for Consult:left foot wound Patient is noted to have two calloused areas on the left foot. They are both closed. He recently had BKA on the right Wound type: no open wounds Pressure Injury POA: NA  Wound YDS:[id number] with darkness  Drainage (amount, consistency, odor) none Periwound: intact  Dressing procedure/placement/frequency: No topical care needed Explained if calloused areas were bothering him in his foot wear or altering his gait to see a podietrist to pare. However with his history best if left alone, left intact to prevent any open wounds would be best.  He verbalized understanding.  I have educated him on the s/s of infection for these areas.   Discussed POC with patient and bedside nurse.  Re consult if needed, will not follow at this time. Thanks  Roshanna Cimino [employer], RN,CWOCN, CNS, [place] [phone])

## 2017-10-20 NOTE — Progress Notes (Signed)
PT Cancellation Note  Patient Details Name: [name] [name] MRN: [id number] DOB: Jun 04, 1971   Cancelled Treatment:    Reason Eval/Treat Not Completed: Medical issues which prohibited therapy.  Very elevated BP and will continue on with therapy when managed.   Ramond Dial 10/20/2017, 10:03 AM   Mee Hives, PT MS Acute Rehab Dept. Number: [place] and [place]

## 2017-10-20 NOTE — Progress Notes (Signed)
[name] [name]  ZOX:[id number] DOB: [birth year] DOA: 10/11/2017 PCP: Patient, No Pcp Per    Brief Narrative/subjective:   46 y.o. male  DM2 who presented to the ER with nausea vomiting and increasing discharge from a right foot wound. MRI revealed osteomyelitis. Post right below knee amputation POD# 7. He also required an insulin gtt for severely uncontrolled DM. He declines ongoing use of insulin and requests oral meds only. A1C 11.1.  No acute events reported overnight. Patient reports this morning productive cough with white and yellow sputum. Admits to chills overnight. cxr ordered. procalcitonin less than 0.10, Lactic acid 1.5.  Significant Events: 11/19 admit  11/21 transtibial amputation on Right   Assessment & Plan:  Sepsis due to necrotic ulcer on R foot - R foot osteomyelitis POD# 7 Right BKA Ortho following - s/p transtibial amputation 11/21 per Dr. Lajoyce Corners  All abx d/c 11/23 Wound vac in place Wound vac removal at discretion of orthopedics Pain management oxy IR for moderate to severe pain prn Po tylenol for mild pain stop dilaudid  Leukocytosis in the setting of recent right foot osteomyelitis, post amputation Wbc 14.5k from 14.5k Blood cx drawn x2  Left foot dry caluses Left foot xray to evaluate wound care nurse  Severely uncontrolled DM2 A1c 11.1 -  Doesn't wish to use insulin start metformin 500 mg bid and glipizide 2 mg daily Needs OP follow up   HTN Persistent Amlodipine 5 mg started today Monitor BP  Hypokalemia  Multifactorial 2/2 malnutrition vs IV insulin treatment  resolved  Renal failure of unclear chronicity  resolved w/ resolution of sepsis   Ambulatory dysfunction 2/2 to recent amputation POD # 7 right BKA PT evaluate and treat SNF for rehab  GERD Stable Famotidine, carafate  Recent Labs  Lab 10/14/17 0421 10/15/17 0757 10/17/17 0431 10/19/17 0620  CREATININE 0.91 1.06 0.89 0.97    DVT prophylaxis: SCDs Code Status:  FULL CODE Family Communication:   Disposition Plan: Social worker exploring available options  Consultants:  Ortho Lajoyce Corners   Antimicrobials:  Clindamycin 11/19 >11/23 Zosyn 11/19 >11/23 Vancomycin 11/19 >11/23  Objective: Blood pressure (!) 168/110, pulse 87, temperature 97.7 F (36.5 C), temperature source Oral, resp. rate 18, height 6' 5.01" (1.956 m), weight 93.9 kg (207 lb 0 oz), SpO2 98 %.  Intake/Output Summary (Last 24 hours) at 10/20/2017 0826 Last data filed at 10/20/2017 0651 Gross per 24 hour  Intake 720 ml  Output 1325 ml  Net -605 ml   Filed Weights   10/11/17 1756 10/11/17 1802 10/13/17 0913  Weight: 93.9 kg (207 lb) 93.9 kg (207 lb) 93.9 kg (207 lb 0 oz)    Examination: General: flat affect no distress Lungs: CTA B w/o wheezing  Cardiovascular: RRR w/o M or rub  Abdomen: NT/ND, soft, BS+ Extremities: No significant edema , Wound vac in place RLE  CBC: Recent Labs  Lab 10/14/17 0421 10/15/17 0757 10/17/17 0431 10/19/17 0620  WBC 14.1* 12.8* 10.0 14.5*  NEUTROABS  --   --  6.0 10.2*  HGB 10.2* 11.5* 11.3* 12.8*  HCT 31.7* 35.6* 35.2* 40.1  MCV 81.9 84.4 83.6 85.0  PLT 239 302 300 386   Basic Metabolic Panel: Recent Labs  Lab 10/14/17 0421 10/15/17 0757 10/17/17 0431 10/19/17 0620  NA 134* 139 134* 135  K 3.6 3.5 3.6 4.2  CL 101 101 98* 97*  CO2 26 30 29 30   GLUCOSE 154* 97 90 166*  BUN 19 15 8 8   CREATININE  0.91 1.06 0.89 0.97  CALCIUM 7.5* 7.9* 7.9* 8.8*  PHOS  --   --  3.0  --    GFR: Estimated Creatinine Clearance: 122.5 mL/min (by C-G formula based on SCr of 0.97 mg/dL).  Liver Function Tests: Recent Labs  Lab 10/17/17 0431  ALBUMIN 1.9*   No results for input(s): LIPASE, AMYLASE in the last 168 hours.  HbA1C: Hgb A1c MFr Bld  Date/Time Value Ref Range Status  10/11/2017 11:20 PM 11.1 (H) 4.8 - 5.6 % Final    Comment:    (NOTE) Pre diabetes:          5.7%-6.4% Diabetes:              >6.4% Glycemic control for    <7.0% adults with diabetes     CBG: Recent Labs  Lab 10/19/17 0625 10/19/17 1232 10/19/17 1613 10/19/17 2136 10/20/17 0647  GLUCAP 164* 144* 128* 175* 148*    Recent Results (from the past 240 hour(s))  Culture, blood (routine x 2)     Status: None   Collection Time: 10/11/17  7:40 PM  Result Value Ref Range Status   Specimen Description BLOOD RIGHT ANTECUBITAL  Final   Special Requests   Final    BOTTLES DRAWN AEROBIC AND ANAEROBIC Blood Culture adequate volume   Culture NO GROWTH 5 DAYS  Final   Report Status 10/16/2017 FINAL  Final  Culture, blood (routine x 2)     Status: None   Collection Time: 10/11/17  7:45 PM  Result Value Ref Range Status   Specimen Description BLOOD LEFT ANTECUBITAL  Final   Special Requests   Final    BOTTLES DRAWN AEROBIC AND ANAEROBIC Blood Culture adequate volume   Culture NO GROWTH 5 DAYS  Final   Report Status 10/16/2017 FINAL  Final  Surgical pcr screen     Status: None   Collection Time: 10/12/17  8:40 PM  Result Value Ref Range Status   MRSA, PCR NEGATIVE NEGATIVE Final   Staphylococcus aureus NEGATIVE NEGATIVE Final    Comment: (NOTE) The Xpert SA Assay (FDA approved for NASAL specimens in patients 51 years of age and older), is one component of a comprehensive surveillance program. It is not intended to diagnose infection nor to guide or monitor treatment.      Scheduled Meds: . famotidine  20 mg Oral Daily  . glipiZIDE  2.5 mg Oral QAC breakfast  . hydrALAZINE      . metFORMIN  500 mg Oral BID WC  . sucralfate  1 g Oral TID WC & HS     LOS: 9 days   Pleas Koch, MD [facility] (P) [phone]   If 7PM-7AM, please contact night-coverage [email or web address] Password [facility] [address] 10/20/2017, 8:26 AM

## 2017-10-21 LAB — BASIC METABOLIC PANEL
Anion gap: 7 (ref 5–15)
BUN: 9 mg/dL (ref 6–20)
CO2: 27 mmol/L (ref 22–32)
Calcium: 8.2 mg/dL — ABNORMAL LOW (ref 8.9–10.3)
Chloride: 100 mmol/L — ABNORMAL LOW (ref 101–111)
Creatinine, Ser: 0.93 mg/dL (ref 0.61–1.24)
GFR calc Af Amer: >60 mL/min (ref >60)
GFR calc non Af Amer: >60 mL/min (ref >60)
Glucose, Bld: 171 mg/dL — ABNORMAL HIGH (ref 65–99)
Potassium: 4 mmol/L (ref 3.5–5.1)
Sodium: 134 mmol/L — ABNORMAL LOW (ref 135–145)

## 2017-10-21 LAB — GLUCOSE, CAPILLARY
Glucose-Capillary: 160 mg/dL — ABNORMAL HIGH (ref 65–99)
Glucose-Capillary: 101 mg/dL — ABNORMAL HIGH (ref 65–99)
Glucose-Capillary: 202 mg/dL — ABNORMAL HIGH (ref 65–99)
Glucose-Capillary: 193 mg/dL — ABNORMAL HIGH (ref 65–99)

## 2017-10-21 LAB — CBC
HCT: 33.3 % — ABNORMAL LOW (ref 39.0–52.0)
Hemoglobin: 10.8 g/dL — ABNORMAL LOW (ref 13.0–17.0)
MCH: 27.1 pg (ref 26.0–34.0)
MCHC: 32.4 g/dL (ref 30.0–36.0)
MCV: 83.5 fL (ref 78.0–100.0)
Platelets: 320 10*3/uL (ref 150–400)
RBC: 3.99 MIL/uL — ABNORMAL LOW (ref 4.22–5.81)
RDW: 14.2 % (ref 11.5–15.5)
WBC: 10.4 10*3/uL (ref 4.0–10.5)

## 2017-10-21 MED ORDER — AMLODIPINE BESYLATE 10 MG PO TABS: 10.0000 mg | ORAL_TABLET | Freq: Every day | ORAL | 0 refills | Status: DC

## 2017-10-21 MED ORDER — GLIPIZIDE 5 MG PO TABS: 2.5000 mg | ORAL_TABLET | Freq: Every day | ORAL | 0 refills | Status: DC

## 2017-10-21 MED ORDER — METFORMIN HCL 500 MG PO TABS: 500.0000 mg | ORAL_TABLET | Freq: Two times a day (BID) | ORAL | 0 refills | Status: DC

## 2017-10-21 MED ORDER — AMLODIPINE BESYLATE 10 MG PO TABS
10.0000 mg | ORAL_TABLET | Freq: Every day | ORAL | Status: DC
  Administered 2017-10-21 – 2017-10-22 (×2): 10 mg via ORAL
  Filled 2017-10-21 (×2): qty 1

## 2017-10-21 NOTE — Discharge Summary (Addendum)
Discharge Summary  [name] [name] PXT:[id number] DOB: Dec 26, 1970  PCP: Patient, No Pcp Per  Admit date: 10/11/2017 Discharge date: 10/21/2017  Time spent: 25 minutes  Update: Patient was unable to leave today due to transportation not available. Will be available to pick him up tomorrow 10/22/17 at 0930.  Recommendations for Outpatient Follow-up:  1. Follow up with orthopedic surgery within a week 2. Follow up with your PCP within a week 3. Take your medications as prescribed 4. Follow up with podiatry in the outpatient setting  Discharge Diagnoses:  Active Hospital Problems   Diagnosis Date Noted  . Sepsis ([place]) 10/11/2017  . Necrotizing fasciitis ([place]) 10/13/2017  . Cellulitis of right foot 10/11/2017  . Uncontrolled type 2 diabetes mellitus with hyperglycemia ([place]) 10/11/2017  . ARF (acute renal failure) ([place]) 10/11/2017  . Nausea & vomiting 10/11/2017    Resolved Hospital Problems  No resolved problems to display.    Discharge Condition: Stable   Diet recommendation: Resume previous diet. Avoid high salt intake.  Vitals:   10/20/17 2100 10/21/17 0540  BP: (!) 159/97 (!) 168/88  Pulse: 91 96  Resp:    Temp: 97.7 F (36.5 C)   SpO2: 100% 100%    History of present illness:  [name] [name] is a 46 y.o.malewith PMH significant for DM2 who presented to the ER with nausea vomiting and increasing discharge from a right foot wound. MRI revealed osteomyelitis. Post right below knee amputation POD# 8. He also required an insulin gtt for severely uncontrolled DM. He declines ongoing use of insulin and requests oral meds only. A1C 11.1.  Patient reports on 10/20/17 productive cough, white and yellow sputum associated with chills. cxr 10/20/17 unremarkable for any acute findings or infiltrates. procalcitonin less than 0.10, Lactic acid 1.5. Left foot with intact calluses xray of the foot showed no sign of osteomyelitis. Wound care nurse consulted and recommended follow  up with podiatrist in the outpatient setting and to leave the calluses intact to prevent infection.  On the day of discharge the patient was hemodynamically stable. Patient will be discharged to SNF to continue physical therapy.  Significant Events: 11/19 admit  11/21 transtibial amputation on Right  Hospital Course:  Principal Problem:   Sepsis ([place]) Active Problems:   Cellulitis of right foot   Uncontrolled type 2 diabetes mellitus with hyperglycemia (H[facility])   ARF (acute renal failure) (H[facility])   Nausea & vomiting   Necrotizing fasciitis ([place])  Sepsis due to necrotic ulcer on R foot - R foot osteomyelitis POD# 8 Right BKA Ortho following - s/p transtibial amputation 11/21 per Dr. Sharol Given  All abx d/c 11/23 Wound vac in place Wound vac removal at discretion of orthopedics Po tylenol for mild to moderate pain  Leukocytosis in the setting of recent right foot osteomyelitis, post amputation, resolved Most likely reactive from recent surgery and hemoconcentration from dehydration Wbc 10 from 14.5 k from 14.5 k  Left foot dry caluses Left foot xray no osteomyelitis wound care nurse no change recommends to leave calluses intact Follow up with podiatrist in the outpatient seting  Severely uncontrolled DM2 A1c 11.1 (10/11/17) Doesn't wish to use insulin start metformin 500 mg bid and glipizide 2 mg daily Follow up PCP  HTN improving Started Amlodipine 10 mg started today Follow up with PCP  Hypokalemia, resolved K+ 4.0 Multifactorial 2/2 malnutrition vs IV insulin treatment   Renal failure of unclear chronicity, resolved  Cr 0.93 resolved w/ resolution of sepsis   Ambulatory dysfunction 2/2 to  recent amputation POD # 7 right BKA Continue PT SNF for physical rehab  GERD Stable Famotidine, carafate  Procedures: 11/21 transtibial amputation on Right  Consultations:  Orthopedic surgery  Wound care specialist  Discharge Exam: BP (!) 168/88 (BP Location: Left  Arm)   Pulse 96   Temp 97.7 F (36.5 C) (Oral)   Resp 18   Ht 6' 5.01" (1.956 m)   Wt 93.9 kg (207 lb 0 oz)   SpO2 100%   BMI 24.54 kg/m   General: 46 yo CM WD WN NAD Cardiovascular: RRR no murmurs rubs or gallops  Respiratory: CTA with no wheezes or rhonchi  Discharge Instructions You were cared for by a hospitalist during your hospital stay. If you have any questions about your discharge medications or the care you received while you were in the hospital after you are discharged, you can call the unit and asked to speak with the hospitalist on call if the hospitalist that took care of you is not available. Once you are discharged, your primary care physician will handle any further medical issues. Please note that NO REFILLS for any discharge medications will be authorized once you are discharged, as it is imperative that you return to your primary care physician (or establish a relationship with a primary care physician if you do not have one) for your aftercare needs so that they can reassess your need for medications and monitor your lab values.   Allergies as of 10/21/2017   No Known Allergies     Medication List    TAKE these medications   amLODipine 10 MG tablet Commonly known as:  NORVASC Take 1 tablet (10 mg total) by mouth daily.   glipiZIDE 5 MG tablet Commonly known as:  GLUCOTROL Take 0.5 tablets (2.5 mg total) by mouth daily before breakfast.   metFORMIN 500 MG tablet Commonly known as:  GLUCOPHAGE Take 1 tablet (500 mg total) by mouth 2 (two) times daily with a meal.      No Known Allergies Follow-up Information    [name], MD Follow up in 1 week(s).   Specialty:  Orthopedic Surgery Contact information: [place] [address] [phone]            The results of significant diagnostics from this hospitalization (including imaging, microbiology, ancillary and laboratory) are listed below for reference.     Significant Diagnostic Studies: [name] Foot Right Wo Contrast  Result Date: 10/12/2017 CLINICAL DATA:  Several day history of foot ulcers on the plantar aspect of the right foot. Diabetic. EXAM: MRI OF THE RIGHT FOREFOOT WITHOUT CONTRAST TECHNIQUE: Multiplanar, multisequence [name] imaging of the right foot was performed. No intravenous contrast was administered. COMPARISON:  Radiographs 10/11/2017 FINDINGS: Diffuse subcutaneous soft tissue swelling/ edema/ fluid consistent with cellulitis. There is also scattered gas in the subcutaneous tissues, mainly along the plantar aspect of the foot likely due to open wounds or gangrene. Diffuse myofasciitis without discrete finding for pyomyositis. Some gas in the muscles concerning for necrotizing fasciitis. Small open wounds are noted on the plantar aspect of foot. One is located approximately overlying the area of the third metatarsal head. Another wound is located in the medial midfoot area on the plantar aspect with associated subcutaneous gas. [name] findings consistent with septic arthritis at the third MTP joint and associated osteomyelitis involving the third metatarsal head and third proximal phalanx. There is also an overlying abscess dorsally and septic tenosynovitis along the plantar aspect of the third  toe. No other definite sites of osteomyelitis are identified. IMPRESSION: 1. Septic arthritis involving the third MTP joint with osteomyelitis in the proximal phalanx and third metatarsal head. Adjacent dorsal abscess measures 2.6 cm. There is also plantar septic tenosynovitis. 2. Plantar foot wounds and adjacent subcutaneous gas. 3. 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 4. Severe myofasciitis with findings suspicious for necrotizing fasciitis. These results will be called to the ordering clinician or representative by the Radiologist Assistant, and communication documented in the PACS or zVision Dashboard. Electronically Signed   By: Marijo Sanes M.D.   On:  10/12/2017 09:26   Dg Chest Port 1 View  Result Date: 10/20/2017 CLINICAL DATA:  Skin ulceration and chronic cough. EXAM: PORTABLE CHEST 1 VIEW COMPARISON:  10/11/2017 FINDINGS: Poor inspiration. Allowing for that, the heart and mediastinum are normal in the lungs are clear. The vascularity is normal. No significant bone finding. IMPRESSION: Poor inspiration.  No active disease. Electronically Signed   By: Nelson Chimes M.D.   On: 10/20/2017 15:45   Dg Abdomen Acute W/chest  Result Date: 10/11/2017 CLINICAL DATA:  46 y/o  M; 4 days of hiccups with vomiting. EXAM: DG ABDOMEN ACUTE W/ 1V CHEST COMPARISON:  None. FINDINGS: There is no evidence of dilated bowel loops or free intraperitoneal air. No radiopaque calculi or other significant radiographic abnormality is seen. Heart size and mediastinal contours are within normal limits. Both lungs are clear. IMPRESSION: Negative abdominal radiographs.  No acute cardiopulmonary disease. Electronically Signed   By: Kristine Garbe M.D.   On: 10/11/2017 20:27   Dg Foot 2 Views Left  Result Date: 10/20/2017 CLINICAL DATA:  Skin ulceration.  Diabetes. EXAM: LEFT FOOT - 2 VIEW COMPARISON:  None. FINDINGS: No plain radiographic evidence of osteomyelitis. 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 Ordinary calcaneal spurs. IMPRESSION: No sign of osteomyelitis. Electronically Signed   By: Nelson Chimes M.D.   On: 10/20/2017 15:46   Dg Foot Complete Right  Result Date: 10/11/2017 CLINICAL DATA:  46 year old male with sepsis.  Right foot infection. EXAM: RIGHT FOOT COMPLETE - 3+ VIEW COMPARISON:  None. FINDINGS: There is no acute fracture or dislocation. The bones are osteopenic. Mild arthritic changes of the second MTP joint. No definite bone erosion or periosteal reaction to suggest osteomyelitis. There is diffuse soft tissue edema and scattered areas of soft tissue air primarily over the  medial aspect of the tarsal region concerning for necrotizing fasciitis. Clinical correlation is recommended. IMPRESSION: 1. No acute fracture or dislocation. 2. No definite radiographic evidence of osteomyelitis. MRI or a WBC nuclear scan may provide better evaluation if there is high clinical concern for osteomyelitis. 3. Diffuse soft tissue swelling of the foot with air most consistent with necrotizing fasciitis. Clinical correlation is recommended. Electronically Signed   By: Anner Crete M.D.   On: 10/11/2017 20:32    Microbiology: Recent Results (from the past 240 hour(s))  Culture, blood (routine x 2)     Status: None   Collection Time: 10/11/17  7:40 PM  Result Value Ref Range Status   Specimen Description BLOOD RIGHT ANTECUBITAL  Final   Special Requests   Final    BOTTLES DRAWN AEROBIC AND ANAEROBIC Blood Culture adequate volume   Culture NO GROWTH 5 DAYS  Final   Report Status 10/16/2017 FINAL  Final  Culture, blood (routine x 2)     Status: None   Collection Time: 10/11/17  7:45  PM  Result Value Ref Range Status   Specimen Description BLOOD LEFT ANTECUBITAL  Final   Special Requests   Final    BOTTLES DRAWN AEROBIC AND ANAEROBIC Blood Culture adequate volume   Culture NO GROWTH 5 DAYS  Final   Report Status 10/16/2017 FINAL  Final  Surgical pcr screen     Status: None   Collection Time: 10/12/17  8:40 PM  Result Value Ref Range Status   MRSA, PCR NEGATIVE NEGATIVE Final   Staphylococcus aureus NEGATIVE NEGATIVE Final    Comment: (NOTE) The Xpert SA Assay (FDA approved for NASAL specimens in patients 54 years of age and older), is one component of a comprehensive surveillance program. It is not intended to diagnose infection nor to guide or monitor treatment.      Labs: Basic Metabolic Panel: Recent Labs  Lab 10/15/17 0757 10/17/17 0431 10/19/17 0620 10/20/17 0937 10/21/17 0538  NA 139 134* 135 131* 134*  K 3.5 3.6 4.2 4.2 4.0  CL 101 98* 97* 97* 100*  CO2  30 29 30 26 27   GLUCOSE 97 90 166* 229* 171*  BUN 15 8 8 9 9   CREATININE 1.06 0.89 0.97 1.00 0.93  CALCIUM 7.9* 7.9* 8.8* 8.6* 8.2*  PHOS  --  3.0  --   --   --    Liver Function Tests: Recent Labs  Lab 10/17/17 0431  ALBUMIN 1.9*   No results for input(s): LIPASE, AMYLASE in the last 168 hours. No results for input(s): AMMONIA in the last 168 hours. CBC: Recent Labs  Lab 10/15/17 0757 10/17/17 0431 10/19/17 0620 10/20/17 0937 10/21/17 0538  WBC 12.8* 10.0 14.5* 14.5* 10.4  NEUTROABS  --  6.0 10.2*  --   --   HGB 11.5* 11.3* 12.8* 12.5* 10.8*  HCT 35.6* 35.2* 40.1 39.2 33.3*  MCV 84.4 83.6 85.0 84.3 83.5  PLT 302 300 386 353 320   Cardiac Enzymes: No results for input(s): CKTOTAL, CKMB, CKMBINDEX, TROPONINI in the last 168 hours. BNP: BNP (last 3 results) No results for input(s): BNP in the last 8760 hours.  ProBNP (last 3 results) No results for input(s): PROBNP in the last 8760 hours.  CBG: Recent Labs  Lab 10/20/17 0647 10/20/17 1145 10/20/17 1610 10/20/17 2156 10/21/17 0701  GLUCAP 148* 168* 125* 148* 160*       Signed:  Kayleen Memos, MD [facility] 10/21/2017, 7:36 AM

## 2017-10-21 NOTE — Progress Notes (Signed)
PT Cancellation Note  Patient Details Name: IGOR BISHOP MRN: [id number] DOB: 27-Sep-1971   Cancelled Treatment:    Reason Eval/Treat Not Completed: Pain limiting ability to participate. Pt reporting pain 8/10, does not think it's time for medication yet.  Declining all therapy despite multiple options presented.  Noted pending transfer to SNF for further rehab.  Will continue to follow per POC.    Shann Medal, PT, DPT  10/21/2017, 3:04 PM

## 2017-10-21 NOTE — Social Work (Signed)
CSW contacted [facility] and they advised that they will not be able to transport patient to [facility] ([address] [place] [address]) until Friday, November 30th at 9:30 am as they are no available drivers to transport today.  CSW will advise clinical staff of transport by [facility] tomorrow, November 30th at 9:30 am.  Elissa Hefty, [facility] Social Worker [phone]

## 2017-10-21 NOTE — Clinical Social Work Placement (Signed)
CLINICAL SOCIAL WORK PLACEMENT  NOTE  Date:  10/21/2017  Patient Details  Name: [name] [name] MRN: [id number] Date of Birth: Nov 26, 1970  Clinical Social Work is seeking post-discharge placement for this patient at the Skilled  Nursing Facility level of care (*CSW will initial, date and re-position this form in  chart as items are completed):  Yes   Patient/family provided with [facility] Clinical Social Work Department's list of facilities offering this level of care within the geographic area requested by the patient (or if unable, by the patient's family).  Yes   Patient/family informed of their freedom to choose among providers that offer the needed level of care, that participate in Medicare, Medicaid or managed care program needed by the patient, have an available bed and are willing to accept the patient.  Yes   Patient/family informed of [facility]'s ownership interest in [facility] and [facility], as well as of the fact that they are under no obligation to receive care at these facilities.  PASRR submitted to EDS on       PASRR number received on 10/15/17     Existing PASRR number confirmed on       FL2 transmitted to all facilities in geographic area requested by pt/family on 10/15/17     FL2 transmitted to all facilities within larger geographic area on       Patient informed that his/her managed care company has contracts with or will negotiate with certain facilities, including the following:        Yes   Patient/family informed of bed offers received.  Patient chooses bed at [facility]     Physician recommends and patient chooses bed at      Patient to be transferred to [facility] on 10/19/17.  Patient to be transferred to facility by       Patient family notified on 10/21/17 of transfer.  Name of family member notified:  patient is responsible for self     PHYSICIAN Please prepare priority discharge  summary, including medications, Please prepare prescriptions     Additional Comment:    _______________________________________________ Tresa Moore, LCSW 10/21/2017, 12:24 PM

## 2017-10-21 NOTE — Social Work (Signed)
CSW faxed discharge summary to [facility] in admission at [facility].  CSW called admissions and left amesasge to confirm receipt of discharge summary so that transport can be set up.  CSW will continue to follow up.  Elissa Hefty, LCSW Clinical Social Worker [phone]

## 2017-10-22 DIAGNOSIS — R05 Cough: Secondary | ICD-10-CM

## 2017-10-22 LAB — GLUCOSE, CAPILLARY: Glucose-Capillary: 156 mg/dL — ABNORMAL HIGH (ref 65–99)

## 2017-10-22 MED ORDER — OXYCODONE HCL 10 MG PO TABS: 10.0000 mg | ORAL_TABLET | Freq: Two times a day (BID) | ORAL | 0 refills | Status: DC

## 2017-10-22 NOTE — Progress Notes (Signed)
Report called to Manuela Schwartz at [facility]. All questions/concerns addressed. Patient ready for transport. Iv removed, catheter tip intact. Wound vac dressing removed per order and 4x4's and ACE wrap applied. Pateitn tolerated well. VSS. Paperwork and prescrtipions sent with patient.

## 2017-10-22 NOTE — Discharge Summary (Addendum)
Discharge Summary  [name] [name] UEA:[id number] DOB: 03/16/1971  PCP: Patient, No Pcp Per  Admit date: 10/11/2017 Discharge date: 10/22/2017  Time spent: 25 minutes  Update: Patient was unable to leave on 10/21/17 due to transportation not available. Will be available to pick him up today 10/22/17 at 0930.  No acute events overnight. Patient was seen and examined at his bedside this morning 10/22/17. No acute complaints. Wound vac removed from Right below the knee. Patient will need to follow up with PCP/ortho in the outpatient setting.  Recommendations for Outpatient Follow-up:  1. Follow up with orthopedic surgery within a week 2. Follow up with your PCP within a week 3. Take your medications as prescribed 4. Follow up with podiatry in the outpatient setting  Discharge Diagnoses:  Active Hospital Problems   Diagnosis Date Noted  . Sepsis (HCC) 10/11/2017  . Necrotizing fasciitis (HCC) 10/13/2017  . Cellulitis of right foot 10/11/2017  . Uncontrolled type 2 diabetes mellitus with hyperglycemia (HCC) 10/11/2017  . ARF (acute renal failure) (HCC) 10/11/2017  . Nausea & vomiting 10/11/2017    Resolved Hospital Problems  No resolved problems to display.    Discharge Condition: Stable   Diet recommendation: Resume previous diet. Avoid high salt intake.  Vitals:   10/21/17 2043 10/22/17 0556  BP: (!) 152/97 (!) 150/92  Pulse: 96 89  Resp: 17 17  Temp: 97.7 F (36.5 C) 97.8 F (36.6 C)  SpO2: 100% 100%    History of present illness:  [name] [name] is a 46 y.o.malewith PMH significant for DM2 who presented to the ER with nausea vomiting and increasing discharge from a right foot wound. MRI revealed osteomyelitis. Post right below knee amputation POD# 8. He also required an insulin gtt for severely uncontrolled DM. He declines ongoing use of insulin and requests oral meds only. A1C 11.1.  Patient reports on 10/20/17 productive cough, white and yellow sputum  associated with chills. cxr 10/20/17 unremarkable for any acute findings or infiltrates. procalcitonin less than 0.10, Lactic acid 1.5. Left foot with intact calluses xray of the foot showed no sign of osteomyelitis. Wound care nurse consulted and recommended follow up with podiatrist in the outpatient setting and to leave the calluses intact to prevent infection.  On the day of discharge the patient was hemodynamically stable. Patient will be discharged to SNF to continue physical therapy.  Significant Events: 11/19 admit  11/21 transtibial amputation on Right  Hospital Course:  Principal Problem:   Sepsis (HCC) Active Problems:   Cellulitis of right foot   Uncontrolled type 2 diabetes mellitus with hyperglycemia (HCC)   ARF (acute renal failure) (HCC)   Nausea & vomiting   Necrotizing fasciitis (HCC)  Sepsis due to necrotic ulcer on R foot - R foot osteomyelitis POD# 8 Right BKA Ortho following - s/p transtibial amputation 11/21 per Dr. Lajoyce Corners  All abx d/c 11/23 Wound vac in place Wound vac removal at discretion of orthopedics Po tylenol for mild to moderate pain  Leukocytosis in the setting of recent right foot osteomyelitis, post amputation, resolved Most likely reactive from recent surgery and hemoconcentration from dehydration Wbc 10 from 14.5 k from 14.5 k  Left foot dry caluses Left foot xray no osteomyelitis wound care nurse no change recommends to leave calluses intact Follow up with podiatrist in the outpatient seting  Severely uncontrolled DM2 A1c 11.1 (10/11/17) Doesn't wish to use insulin start metformin 500 mg bid and glipizide 2 mg daily Follow up PCP  HTN improving Started  Amlodipine 10 mg started today Follow up with PCP  Hypokalemia, resolved K+ 4.0 Multifactorial 2/2 malnutrition vs IV insulin treatment   Renal failure of unclear chronicity, resolved  Cr 0.93 resolved w/ resolution of sepsis   Ambulatory dysfunction 2/2 to recent  amputation POD # 7 right BKA Continue PT SNF for physical rehab  GERD Stable Famotidine, carafate  Procedures: 11/21 transtibial amputation on Right  Consultations:  Orthopedic surgery  Wound care specialist  Discharge Exam: BP (!) 150/92 (BP Location: Left Arm)   Pulse 89   Temp 97.8 F (36.6 C) (Oral)   Resp 17   Ht 6' 5.01" (1.956 m)   Wt 93.9 kg (207 lb 0 oz)   SpO2 100%   BMI 24.54 kg/m   General: 46 yo CM WD WN NAD Cardiovascular: RRR no murmurs rubs or gallops  Respiratory: CTA with no wheezes or rhonchi  Discharge Instructions You were cared for by a hospitalist during your hospital stay. If you have any questions about your discharge medications or the care you received while you were in the hospital after you are discharged, you can call the unit and asked to speak with the hospitalist on call if the hospitalist that took care of you is not available. Once you are discharged, your primary care physician will handle any further medical issues. Please note that NO REFILLS for any discharge medications will be authorized once you are discharged, as it is imperative that you return to your primary care physician (or establish a relationship with a primary care physician if you do not have one) for your aftercare needs so that they can reassess your need for medications and monitor your lab values.   Allergies as of 10/22/2017   No Known Allergies     Medication List    TAKE these medications   amLODipine 10 MG tablet Commonly known as:  NORVASC Take 1 tablet (10 mg total) by mouth daily.   glipiZIDE 5 MG tablet Commonly known as:  GLUCOTROL Take 0.5 tablets (2.5 mg total) by mouth daily before breakfast.   metFORMIN 500 MG tablet Commonly known as:  GLUCOPHAGE Take 1 tablet (500 mg total) by mouth 2 (two) times daily with a meal.   Oxycodone HCl 10 MG Tabs Take 1 tablet (10 mg total) by mouth 2 (two) times daily.      No Known Allergies  Contact  information for follow-up providers    Nadara Mustard, MD Follow up in 1 week(s).   Specialty:  Orthopedic Surgery Contact information: [address] [place] [address] [phone]            Contact information for after-discharge care    Destination    [facility] .   Service:  Skilled Nursing Contact information: [address] [place] [address] [phone]                   The results of significant diagnostics from this hospitalization (including imaging, microbiology, ancillary and laboratory) are listed below for reference.    Significant Diagnostic Studies: [name] Foot Right Wo Contrast  Result Date: 10/12/2017 CLINICAL DATA:  Several day history of foot ulcers on the plantar aspect of the right foot. Diabetic. EXAM: MRI OF THE RIGHT FOREFOOT WITHOUT CONTRAST TECHNIQUE: Multiplanar, multisequence [name] imaging of the right foot was performed. No intravenous contrast was administered. COMPARISON:  Radiographs 10/11/2017 FINDINGS: Diffuse subcutaneous soft tissue swelling/ edema/ fluid consistent with cellulitis. There is also  scattered gas in the subcutaneous tissues, mainly along the plantar aspect of the foot likely due to open wounds or gangrene. Diffuse myofasciitis without discrete finding for pyomyositis. Some gas in the muscles concerning for necrotizing fasciitis. Small open wounds are noted on the plantar aspect of foot. One is located approximately overlying the area of the third metatarsal head. Another wound is located in the medial midfoot area on the plantar aspect with associated subcutaneous gas. [name] findings consistent with septic arthritis at the third MTP joint and associated osteomyelitis involving the third metatarsal head and third proximal phalanx. There is also an overlying abscess dorsally and septic tenosynovitis along the plantar aspect of the third toe. No other definite sites of osteomyelitis  are identified. IMPRESSION: 1. Septic arthritis involving the third MTP joint with osteomyelitis in the proximal phalanx and third metatarsal head. Adjacent dorsal abscess measures 2.6 cm. There is also plantar septic tenosynovitis. 2. Plantar foot wounds and adjacent subcutaneous gas. 3. 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 4. Severe myofasciitis with findings suspicious for necrotizing fasciitis. These results will be called to the ordering clinician or representative by the Radiologist Assistant, and communication documented in the PACS or zVision Dashboard. Electronically Signed   By: Rudie Meyer M.D.   On: 10/12/2017 09:26   Dg Chest Port 1 View  Result Date: 10/20/2017 CLINICAL DATA:  Skin ulceration and chronic cough. EXAM: PORTABLE CHEST 1 VIEW COMPARISON:  10/11/2017 FINDINGS: Poor inspiration. Allowing for that, the heart and mediastinum are normal in the lungs are clear. The vascularity is normal. No significant bone finding. IMPRESSION: Poor inspiration.  No active disease. Electronically Signed   By: Paulina Fusi M.D.   On: 10/20/2017 15:45   Dg Abdomen Acute W/chest  Result Date: 10/11/2017 CLINICAL DATA:  46 y/o  M; 4 days of hiccups with vomiting. EXAM: DG ABDOMEN ACUTE W/ 1V CHEST COMPARISON:  None. FINDINGS: There is no evidence of dilated bowel loops or free intraperitoneal air. No radiopaque calculi or other significant radiographic abnormality is seen. Heart size and mediastinal contours are within normal limits. Both lungs are clear. IMPRESSION: Negative abdominal radiographs.  No acute cardiopulmonary disease. Electronically Signed   By: Mitzi Hansen M.D.   On: 10/11/2017 20:27   Dg Foot 2 Views Left  Result Date: 10/20/2017 CLINICAL DATA:  Skin ulceration.  Diabetes. EXAM: LEFT FOOT - 2 VIEW COMPARISON:  None. FINDINGS: No plain radiographic evidence of osteomyelitis. 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 Ordinary calcaneal spurs. IMPRESSION: No sign of osteomyelitis. Electronically Signed   By: Paulina Fusi M.D.   On: 10/20/2017 15:46   Dg Foot Complete Right  Result Date: 10/11/2017 CLINICAL DATA:  46 year old male with sepsis.  Right foot infection. EXAM: RIGHT FOOT COMPLETE - 3+ VIEW COMPARISON:  None. FINDINGS: There is no acute fracture or dislocation. The bones are osteopenic. Mild arthritic changes of the second MTP joint. No definite bone erosion or periosteal reaction to suggest osteomyelitis. There is diffuse soft tissue edema and scattered areas of soft tissue air primarily over the medial aspect of the tarsal region concerning for necrotizing fasciitis. Clinical correlation is recommended. IMPRESSION: 1. No acute fracture or dislocation. 2. No definite radiographic evidence of osteomyelitis. MRI or a WBC nuclear scan may provide better evaluation if there is high clinical concern for osteomyelitis. 3. Diffuse soft tissue swelling of the foot with air most consistent with necrotizing fasciitis. Clinical correlation  is recommended. Electronically Signed   By: Elgie Collard M.D.   On: 10/11/2017 20:32    Microbiology: Recent Results (from the past 240 hour(s))  Surgical pcr screen     Status: None   Collection Time: 10/12/17  8:40 PM  Result Value Ref Range Status   MRSA, PCR NEGATIVE NEGATIVE Final   Staphylococcus aureus NEGATIVE NEGATIVE Final    Comment: (NOTE) The Xpert SA Assay (FDA approved for NASAL specimens in patients 10 years of age and older), is one component of a comprehensive surveillance program. It is not intended to diagnose infection nor to guide or monitor treatment.   Culture, blood (routine x 2)     Status: None (Preliminary result)   Collection Time: 10/20/17 11:00 AM  Result Value Ref Range Status   Specimen Description BLOOD LEFT ANTECUBITAL  Final   Special Requests   Final    BOTTLES DRAWN AEROBIC ONLY Blood Culture adequate  volume   Culture NO GROWTH 1 DAY  Final   Report Status PENDING  Incomplete  Culture, blood (routine x 2)     Status: None (Preliminary result)   Collection Time: 10/20/17 11:15 AM  Result Value Ref Range Status   Specimen Description BLOOD LEFT ARM  Final   Special Requests   Final    BOTTLES DRAWN AEROBIC ONLY Blood Culture adequate volume   Culture NO GROWTH 1 DAY  Final   Report Status PENDING  Incomplete     Labs: Basic Metabolic Panel: Recent Labs  Lab 10/17/17 0431 10/19/17 0620 10/20/17 0937 10/21/17 0538  NA 134* 135 131* 134*  K 3.6 4.2 4.2 4.0  CL 98* 97* 97* 100*  CO2 29 30 26 27   GLUCOSE 90 166* 229* 171*  BUN 8 8 9 9   CREATININE 0.89 0.97 1.00 0.93  CALCIUM 7.9* 8.8* 8.6* 8.2*  PHOS 3.0  --   --   --    Liver Function Tests: Recent Labs  Lab 10/17/17 0431  ALBUMIN 1.9*   No results for input(s): LIPASE, AMYLASE in the last 168 hours. No results for input(s): AMMONIA in the last 168 hours. CBC: Recent Labs  Lab 10/17/17 0431 10/19/17 0620 10/20/17 0937 10/21/17 0538  WBC 10.0 14.5* 14.5* 10.4  NEUTROABS 6.0 10.2*  --   --   HGB 11.3* 12.8* 12.5* 10.8*  HCT 35.2* 40.1 39.2 33.3*  MCV 83.6 85.0 84.3 83.5  PLT 300 386 353 320   Cardiac Enzymes: No results for input(s): CKTOTAL, CKMB, CKMBINDEX, TROPONINI in the last 168 hours. BNP: BNP (last 3 results) No results for input(s): BNP in the last 8760 hours.  ProBNP (last 3 results) No results for input(s): PROBNP in the last 8760 hours.  CBG: Recent Labs  Lab 10/21/17 0701 10/21/17 1205 10/21/17 1646 10/21/17 2039 10/22/17 0622  GLUCAP 160* 101* 202* 193* 156*       Signed:  Darlin Drop, MD [facility] 10/22/2017, 9:49 AM

## 2017-10-22 NOTE — Progress Notes (Signed)
Patient will DC to: [facility] Anticipated DC date: 10/22/17 Family notified: Patient notifying family Transport by: [place] 9:30am   Per MD patient ready for DC to [facility]. RN, patient, patient's family, and facility notified of DC. Discharge Summary sent to facility. RN given number for report [phone]).   CSW signing off.  Cedric Fishman, [place] Social Worker [phone]

## 2017-10-22 NOTE — Clinical Social Work Placement (Signed)
CLINICAL SOCIAL WORK PLACEMENT  NOTE  Date:  10/22/2017  Patient Details  Name: [name] [name] MRN: [id number] Date of Birth: 1971-03-17  Clinical Social Work is seeking post-discharge placement for this patient at the Skilled  Nursing Facility level of care (*CSW will initial, date and re-position this form in  chart as items are completed):  Yes   Patient/family provided with [place] Clinical Social Work Department's list of facilities offering this level of care within the geographic area requested by the patient (or if unable, by the patient's family).  Yes   Patient/family informed of their freedom to choose among providers that offer the needed level of care, that participate in Medicare, Medicaid or managed care program needed by the patient, have an available bed and are willing to accept the patient.  Yes   Patient/family informed of [place]'s ownership interest in [facility] and [facility], as well as of the fact that they are under no obligation to receive care at these facilities.  PASRR submitted to EDS on       PASRR number received on 10/15/17     Existing PASRR number confirmed on       FL2 transmitted to all facilities in geographic area requested by pt/family on 10/15/17     FL2 transmitted to all facilities within larger geographic area on       Patient informed that his/her managed care company has contracts with or will negotiate with certain facilities, including the following:        Yes   Patient/family informed of bed offers received.  Patient chooses bed at [facility]     Physician recommends and patient chooses bed at      Patient to be transferred to [facility] on 10/22/17.  Patient to be transferred to facility by       Patient family notified on 10/21/17 of transfer.  Name of family member notified:  patient is responsible for self     PHYSICIAN Please prepare priority discharge  summary, including medications, Please prepare prescriptions     Additional Comment:    _______________________________________________ Mearl Latin, LCSWA 10/22/2017, 9:02 AM

## 2017-10-25 LAB — CULTURE, BLOOD (ROUTINE X 2)
Culture: NO GROWTH
Culture: NO GROWTH
Special Requests: ADEQUATE
Special Requests: ADEQUATE

## 2017-11-10 ENCOUNTER — Telehealth (INDEPENDENT_AMBULATORY_CARE_PROVIDER_SITE_OTHER): Payer: Self-pay | Admitting: Orthopaedic Surgery

## 2017-11-10 NOTE — Telephone Encounter (Signed)
[place] patient °

## 2017-11-10 NOTE — Telephone Encounter (Signed)
Patient had a below knee amputation on 11/21 and has been in [facility] in [place]. He states his 30 days is almost up and would like to know what the next steps for him are. Please call and advise # [phone]

## 2017-11-11 NOTE — Telephone Encounter (Signed)
Left voicemail asking patients plan, if he was staying in [place] area. Otherwise he would need to see Dr. Sharol Given for at least one appointment, for possible staple or stitch removal, we would write him rx for shrinker to help get his leg ready for prosthetic and refer him to orthotist. Asked he please call back and let us know what he is wanting to do, or if he is looking to switch care to [place] area?

## 2017-11-19 ENCOUNTER — Ambulatory Visit: Payer: Self-pay | Admitting: Internal Medicine

## 2017-11-19 ENCOUNTER — Telehealth (INDEPENDENT_AMBULATORY_CARE_PROVIDER_SITE_OTHER): Payer: Self-pay | Admitting: Orthopedic Surgery

## 2017-11-19 ENCOUNTER — Encounter: Payer: Self-pay | Admitting: Internal Medicine

## 2017-11-19 VITALS — BP 124/80 | HR 88 | Resp 12 | Ht 77.5 in | Wt 200.0 lb

## 2017-11-19 DIAGNOSIS — E1165 Type 2 diabetes mellitus with hyperglycemia: Secondary | ICD-10-CM

## 2017-11-19 DIAGNOSIS — T8789 Other complications of amputation stump: Secondary | ICD-10-CM

## 2017-11-19 DIAGNOSIS — I10 Essential (primary) hypertension: Secondary | ICD-10-CM

## 2017-11-19 DIAGNOSIS — E1142 Type 2 diabetes mellitus with diabetic polyneuropathy: Secondary | ICD-10-CM

## 2017-11-19 DIAGNOSIS — M79604 Pain in right leg: Secondary | ICD-10-CM

## 2017-11-19 LAB — GLUCOSE, POCT (MANUAL RESULT ENTRY): POC Glucose: 91 mg/dL (ref 70–99)

## 2017-11-19 MED ORDER — GLUCOSE BLOOD VI STRP: ORAL_STRIP | 12 refills | Status: DC

## 2017-11-19 MED ORDER — AGAMATRIX PRESTO W/DEVICE KIT: PACK | 0 refills | Status: DC

## 2017-11-19 MED ORDER — METFORMIN HCL 500 MG PO TABS: 500.0000 mg | ORAL_TABLET | Freq: Two times a day (BID) | ORAL | 11 refills | Status: DC

## 2017-11-19 MED ORDER — AGAMATRIX ULTRA-THIN LANCETS MISC: 11 refills | Status: DC

## 2017-11-19 MED ORDER — AMLODIPINE BESYLATE 10 MG PO TABS: 10.0000 mg | ORAL_TABLET | Freq: Every day | ORAL | 11 refills | Status: DC

## 2017-11-19 MED ORDER — GABAPENTIN 300 MG PO CAPS: ORAL_CAPSULE | ORAL | 11 refills | Status: DC

## 2017-11-19 MED ORDER — GLIPIZIDE 5 MG PO TABS: ORAL_TABLET | ORAL | 11 refills | Status: DC

## 2017-11-19 MED ORDER — AMLODIPINE BESYLATE 10 MG PO TABS: 10.0000 mg | ORAL_TABLET | Freq: Every day | ORAL | 0 refills | Status: DC

## 2017-11-19 NOTE — Progress Notes (Signed)
Subjective:    Patient ID: [name] [name], male    DOB: 1971/02/17, 46 y.o.   MRN: [id number]  HPI   Here to establish  1.  DM:  Diagnosed 8 years ago.  Weighed around 280 lbs then.  States he did not purposefully change lifestyle, just started to lose weight.  Weighed 196.6 lb at rehab facility about 7 days ago.   No insurance since fired from job since 2015.  Was being followed prior to 2015 at [facility] [place].  He has no idea whether his A1C was at goal. Was off medication since 2015 until he was hospitalized for necrotizing fasciitis of foot and lower leg and underwent BKA   10/13/2017 with Dr. Sharol Given.  Was hospitalized for about 2 weeks before being discharged to rehab.   Rehab was at [facility] in [place] near [place]. Discharged from rehab 5 days ago. Metformin, Glipizide and Lisinopril. He is uncertain if he had retinal disease as has not had an eye check since he was a child. He does state his vision is very poor and worsening over past 2-3 months.   He does not have a glucometer, nor test strips. Sugars in rehab were running around 120.   No A1C available other than the 11.1 from his initial admission to hospital in November. Did have ARF felt due to sepsis with recovery of BUN and creatinine. Does have peripheral neuropathy.  2.  No definite history of hyperlipidemia.  3.  Essential Hypertension:  Diagnosed about 8 years ago as well.  4.  Kidney problems:  Not clear history.  states had to do with drinking too many sodas.  States he gets nosebleeds, but not clear how it affected his kidneys.  Perhaps kidney infections.  Has 18 Oxycodone tabs 10 mg left, which he is using  Current Meds  Medication Sig  . amLODipine (NORVASC) 10 MG tablet Take 1 tablet (10 mg total) by mouth daily.  . baclofen (LIORESAL) 10 MG tablet Take 5 mg by mouth 3 (three) times daily.  Marland Kitchen gabapentin (NEURONTIN) 300 MG capsule 1 cap by mouth in morning and midday, 2 caps by mouth  at bedtime  . glipiZIDE (GLUCOTROL) 5 MG tablet 1/2 tab by mouth twice daily with meal  . metFORMIN (GLUCOPHAGE) 500 MG tablet Take 1 tablet (500 mg total) by mouth 2 (two) times daily with a meal.  . methocarbamol (ROBAXIN) 500 MG tablet Take 500 mg by mouth every 6 (six) hours as needed for muscle spasms.  Marland Kitchen oxyCODONE 10 MG TABS Take 1 tablet (10 mg total) by mouth 2 (two) times daily.  . [DISCONTINUED] amLODipine (NORVASC) 10 MG tablet Take 1 tablet (10 mg total) by mouth daily.  . [DISCONTINUED] amLODipine (NORVASC) 10 MG tablet Take 1 tablet (10 mg total) by mouth daily.  . [DISCONTINUED] gabapentin (NEURONTIN) 300 MG capsule Take 300 mg by mouth 3 (three) times daily.  . [DISCONTINUED] glipiZIDE (GLUCOTROL) 5 MG tablet Take 0.5 tablets (2.5 mg total) by mouth daily before breakfast.  . [DISCONTINUED] metFORMIN (GLUCOPHAGE) 500 MG tablet Take 1 tablet (500 mg total) by mouth 2 (two) times daily with a meal.    Allergies  Allergen Reactions  . Adhesive [Tape]     Rash at site of tape--okay to use paper tape    Past Medical History:  Diagnosis Date  . ARF (acute renal failure) ([place]) 09/2017  . Diabetes mellitus without complication ([place])   . Hypertension    Past  Surgical History:  Procedure Laterality Date  . AMPUTATION Right 10/13/2017   Procedure: RIGHT BELOW KNEE AMPUTATION;  Surgeon: [name], MD;  Location: [facility];  Service: Orthopedics;  Laterality: Right;  . NO PAST SURGERIES     Social History   Socioeconomic History  . Marital status: Divorced    Spouse name: Not on file  . Number of children: 1  . Years of education: 91  . Highest education level: Associate degree: occupational, Hotel manager, or vocational program  Social Needs  . Financial resource strain: Somewhat hard  . Food insecurity - worry: Not on file  . Food insecurity - inability: Not on file  . Transportation needs - medical: Not on file  . Transportation needs - non-medical: Not on file    Occupational History  . Occupation: unemployed  Tobacco Use  . Smoking status: Former Smoker    Packs/day: 0.25    Years: 35.00    Pack years: 8.75    Types: Cigarettes  . Smokeless tobacco: Never Used  . Tobacco comment: stopped, but back on 1 cigarette daily.  Substance and Sexual Activity  . Alcohol use: No    Frequency: Never  . Drug use: No  . Sexual activity: Not on file  Other Topics Concern  . Not on file  Social History Narrative   Originally from  [place].   Is living with his mother, who essentially supports him now.       Review of Systems     Objective:   Physical Exam   NAD in wheelchair. Pale and chronically ill appearing HEENT:  PERRL, EOMI, TMs pearly gray, throat without injection. Neck:  Supple, No adenopathy, no thyromegaly Chest:  CTA CV:  RRR with normal S1 and S2, No S3, S4 or murmur.  Radial pulses normal and equal. Right stump:  Flaking all over stump and along leg.  Two less than 1/2 cm areas of surgical site where scab pulled a way and healing tissue exposed with scant weeping.  No erythema or pustular discharge.  Color of skin good with good cap refill.       Assessment & Plan:  1.  DM:  Poor control for 3 years.  Metformin and Glipizide prescriptions sent to [facility]. Prescriptions for glucometer, test strips, and lancets sent to  [facility]. A1C  2.  Essential Hypertension: Controlled.   Rx for Amlodipine to [facility].  3.  Diabetic peripheral neuropathy:  Increase evening dose of Gabapentin to 600 mg, continue morning and afternoon dose of 300 mg.  4.  Right BKA:  Appears to be healing fine, though needs improved skin care and wound care.  Patient is unwilling to perform this at all.  He is dismissive of his mother helping him as well.   He does have Home Health checking in with him tomorrow.  Left message on dressing change tape and paperwork with my contact information to call me regarding his wound care recommendations. Discussed I  would not continue to refill his Oxycodone.  He needs to decrease to 5 mg (1/2 tab) 3 times daily and then wean by 1/2 tab daily thereafter. Also would like for him to utilize Methocarbamol only as needed and start wean of baclofen as well.

## 2017-11-19 NOTE — Telephone Encounter (Signed)
Pt called and would like to dicuss a raft possible I think its something for his leg pt wasn't to sure of  the name

## 2017-11-19 NOTE — Patient Instructions (Signed)
Increase your evening dose of Gabapentin to 2 caps.  Continue the 1 cap in the morning and midday.

## 2017-11-19 NOTE — Telephone Encounter (Signed)
I left voicemail for patient to return call. 

## 2017-11-23 ENCOUNTER — Inpatient Hospital Stay (HOSPITAL_COMMUNITY)
Admission: EM | Admit: 2017-11-23 | Discharge: 2017-11-30 | DRG: 475 | Disposition: A | Discharge status: Skilled Nursing Facility | Payer: Self-pay | Attending: Internal Medicine | Admitting: Internal Medicine

## 2017-11-23 ENCOUNTER — Encounter (HOSPITAL_COMMUNITY): Payer: Self-pay

## 2017-11-23 ENCOUNTER — Other Ambulatory Visit: Payer: Self-pay

## 2017-11-23 DIAGNOSIS — I10 Essential (primary) hypertension: Secondary | ICD-10-CM | POA: Diagnosis present

## 2017-11-23 DIAGNOSIS — D72829 Elevated white blood cell count, unspecified: Secondary | ICD-10-CM

## 2017-11-23 DIAGNOSIS — D649 Anemia, unspecified: Secondary | ICD-10-CM | POA: Diagnosis present

## 2017-11-23 DIAGNOSIS — W19XXXA Unspecified fall, initial encounter: Secondary | ICD-10-CM | POA: Diagnosis present

## 2017-11-23 DIAGNOSIS — Z7984 Long term (current) use of oral hypoglycemic drugs: Secondary | ICD-10-CM

## 2017-11-23 DIAGNOSIS — T8753 Necrosis of amputation stump, right lower extremity: Secondary | ICD-10-CM | POA: Diagnosis present

## 2017-11-23 DIAGNOSIS — R42 Dizziness and giddiness: Secondary | ICD-10-CM | POA: Diagnosis not present

## 2017-11-23 DIAGNOSIS — T8743 Infection of amputation stump, right lower extremity: Secondary | ICD-10-CM | POA: Diagnosis not present

## 2017-11-23 DIAGNOSIS — R509 Fever, unspecified: Secondary | ICD-10-CM

## 2017-11-23 DIAGNOSIS — I152 Hypertension secondary to endocrine disorders: Secondary | ICD-10-CM | POA: Diagnosis present

## 2017-11-23 DIAGNOSIS — T8781 Dehiscence of amputation stump: Principal | ICD-10-CM | POA: Diagnosis present

## 2017-11-23 DIAGNOSIS — T8130XA Disruption of wound, unspecified, initial encounter: Secondary | ICD-10-CM | POA: Diagnosis present

## 2017-11-23 DIAGNOSIS — Z833 Family history of diabetes mellitus: Secondary | ICD-10-CM

## 2017-11-23 DIAGNOSIS — E1142 Type 2 diabetes mellitus with diabetic polyneuropathy: Secondary | ICD-10-CM | POA: Diagnosis present

## 2017-11-23 DIAGNOSIS — E119 Type 2 diabetes mellitus without complications: Secondary | ICD-10-CM

## 2017-11-23 DIAGNOSIS — E1152 Type 2 diabetes mellitus with diabetic peripheral angiopathy with gangrene: Secondary | ICD-10-CM | POA: Diagnosis present

## 2017-11-23 DIAGNOSIS — Z8249 Family history of ischemic heart disease and other diseases of the circulatory system: Secondary | ICD-10-CM

## 2017-11-23 DIAGNOSIS — Y92009 Unspecified place in unspecified non-institutional (private) residence as the place of occurrence of the external cause: Secondary | ICD-10-CM

## 2017-11-23 DIAGNOSIS — E1165 Type 2 diabetes mellitus with hyperglycemia: Secondary | ICD-10-CM | POA: Diagnosis present

## 2017-11-23 DIAGNOSIS — W1830XA Fall on same level, unspecified, initial encounter: Secondary | ICD-10-CM | POA: Diagnosis present

## 2017-11-23 DIAGNOSIS — D62 Acute posthemorrhagic anemia: Secondary | ICD-10-CM

## 2017-11-23 DIAGNOSIS — Z794 Long term (current) use of insulin: Secondary | ICD-10-CM | POA: Diagnosis present

## 2017-11-23 DIAGNOSIS — I959 Hypotension, unspecified: Secondary | ICD-10-CM | POA: Diagnosis not present

## 2017-11-23 DIAGNOSIS — R296 Repeated falls: Secondary | ICD-10-CM | POA: Diagnosis present

## 2017-11-23 DIAGNOSIS — Y835 Amputation of limb(s) as the cause of abnormal reaction of the patient, or of later complication, without mention of misadventure at the time of the procedure: Secondary | ICD-10-CM | POA: Diagnosis present

## 2017-11-23 DIAGNOSIS — Z87891 Personal history of nicotine dependence: Secondary | ICD-10-CM

## 2017-11-23 HISTORY — DX: Type 2 diabetes mellitus without complications: E11.9

## 2017-11-23 MED ORDER — CEFAZOLIN SODIUM-DEXTROSE 1-4 GM/50ML-% IV SOLN
1.0000 g | Freq: Once | INTRAVENOUS | Status: AC
  Administered 2017-11-23: 1 g via INTRAVENOUS
  Filled 2017-11-23: qty 50

## 2017-11-23 NOTE — ED Notes (Signed)
ED Provider at bedside. 

## 2017-11-23 NOTE — ED Triage Notes (Signed)
Pt presents for evaluation of R BKA wound dihiscence on Sunday. Pt reports mechanical fall, states hit head but no LOC and hit R side ribs. Pt reports did not notice wound was open until home health nurse came today to change dressing.

## 2017-11-23 NOTE — ED Provider Notes (Signed)
[facility] EMERGENCY DEPARTMENT Provider Note   CSN: [id number] Arrival date & time: 11/23/17  1448     History   Chief Complaint Chief Complaint  Patient presents with  . Wound Dehiscence  . Fall    HPI [name] [name] is a 47 y.o. 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 Pt had mechanical fall two days ago falling backwards and landing on buttocks, thinks his RLE bounced on the ground. Did not look at his wound until this afternoon. Reports bleeding and worsening local pain. Pain is sharp and shooting typical of his neuropathy. No interventions PTA. No alleviating factors. No fevers, chills, nausea, vomiting, abdominal pain, CP, SOB. Reports 3 mechanical falls in the last 1.5 weeks since he has been at home from rehab. Denies head trauma, LOC, vision changes or any other symptoms from mechanical fall two days ago.   HPI  Past Medical History:  Diagnosis Date  . ARF (acute renal failure) ([place]) 09/2017  . Diabetes mellitus without complication ([place])   . Hypertension     Patient Active Problem List   Diagnosis Date Noted  . Necrotizing fasciitis ([place]) 10/13/2017  . Sepsis ([place]) 10/11/2017  . Cellulitis of right foot 10/11/2017  . Uncontrolled type 2 diabetes mellitus with hyperglycemia ([place]) 10/11/2017  . ARF (acute renal failure) ([place]) 10/11/2017  . Nausea & vomiting 10/11/2017    Past Surgical History:  Procedure Laterality Date  . AMPUTATION Right 10/13/2017   Procedure: RIGHT BELOW KNEE AMPUTATION;  Surgeon: [name], MD;  Location: [facility];  Service: Orthopedics;  Laterality: Right;  . NO PAST SURGERIES         Home Medications    Prior to Admission medications   Medication Sig Start Date End Date Taking? Authorizing Provider  amLODipine  (NORVASC) 10 MG tablet Take 1 tablet (10 mg total) by mouth daily. 11/19/17  Yes Mack Hook, MD  baclofen (LIORESAL) 10 MG tablet Take 10 mg by mouth 3 (three) times daily.    Yes [provider]  gabapentin (NEURONTIN) 300 MG capsule 1 cap by mouth in morning and midday, 2 caps by mouth at bedtime Patient taking differently: Take 300 mg by mouth 3 (three) times daily.  11/19/17  Yes Mack Hook, MD  glipiZIDE (GLUCOTROL) 5 MG tablet 1/2 tab by mouth twice daily with meal Patient taking differently: Take 2.5 mg by mouth 2 (two) times daily with a meal.  11/19/17  Yes Mack Hook, MD  metFORMIN (GLUCOPHAGE) 500 MG tablet Take 1 tablet (500 mg total) by mouth 2 (two) times daily with a meal. 11/19/17  Yes Mack Hook, MD  methocarbamol (ROBAXIN) 500 MG tablet Take 500 mg by mouth every 6 (six) hours as needed for muscle spasms.   Yes [provider]  oxyCODONE 10 MG TABS Take 1 tablet (10 mg total) by mouth 2 (two) times daily. Patient taking differently: Take 10 mg by mouth every 6 (six) hours as needed.  10/22/17  Yes Hall, [place], DO  AGAMATRIX ULTRA-THIN LANCETS MISC Check blood sugars before meals twice daily 11/19/17   Mack Hook, MD  Blood Glucose Monitoring Suppl [facility] PRESTO) w/Device KIT Check sugars twice daily before meals 11/19/17   Mack Hook, MD  glucose blood (AGAMATRIX PRESTO TEST) test strip Check blood  sugars twice daily before meals 11/19/17   Mack Hook, MD    Family History Family History  Problem Relation Age of Onset  . Diabetes Mellitus II Mother   . Depression Mother   . Heart disease Father        CABG x 4.  04/2017    Social History Social History   Tobacco Use  . Smoking status: Former Smoker    Packs/day: 0.25    Years: 35.00    Pack years: 8.75    Types: Cigarettes  . Smokeless tobacco: Never Used  . Tobacco comment: stopped, but back on 1 cigarette daily.  Substance Use  Topics  . Alcohol use: No    Frequency: Never  . Drug use: No     Allergies   Adhesive [tape]   Review of Systems Review of Systems  Musculoskeletal: Positive for arthralgias (RLE).  Skin: Positive for wound.  Allergic/Immunologic: Positive for immunocompromised state (DM).  All other systems reviewed and are negative.    Physical Exam Updated Vital Signs BP 126/78   Pulse 99   Temp 98.5 F (36.9 C) (Oral)   Resp 16   SpO2 99%   Physical Exam  Constitutional: He is oriented to person, place, and time. He appears well-developed and well-nourished. No distress.  NAD.  HENT:  Head: Normocephalic and atraumatic.  Right Ear: External ear normal.  Left Ear: External ear normal.  Nose: Nose normal.  Eyes: Conjunctivae and EOM are normal. No scleral icterus.  Neck: Normal range of motion.  Cardiovascular: Normal rate, regular rhythm, normal heart sounds and intact distal pulses.  No murmur heard. 2+ radial and popliteal pulses bilaterally.   Pulmonary/Chest: Effort normal.  Abdominal: Soft.  Musculoskeletal: Normal range of motion. He exhibits no deformity.  Neurological: He is alert and oriented to person, place, and time.  Skin: Skin is warm and dry. Capillary refill takes less than 2 seconds.  7 cm area of wound dehiscence of RKA surgical incision, no active bleeding.  Significant dry, peeling skin to LLE. Three wounds to left foot, non tender w/o evidence of cellulitis or abscess. Two measuring <2 cm in diameter, one <1cm  Psychiatric: He has a normal mood and affect. His behavior is normal. Judgment and thought content normal.  Nursing note and vitals reviewed.      ED Treatments / Results  Labs (all labs ordered are listed, but only abnormal results are displayed) Labs Reviewed  CBC WITH DIFFERENTIAL/PLATELET  BASIC METABOLIC PANEL    EKG  EKG Interpretation None       Radiology No results found.  Procedures Procedures (including critical care  time)  Medications Ordered in ED Medications  ceFAZolin (ANCEF) IVPB 1 g/50 mL premix (not administered)     Initial Impression / Assessment and Plan / ED Course  I have reviewed the triage vital signs and the nursing notes.  Pertinent labs & imaging results that were available during my care of the patient were reviewed by me and considered in my medical decision making (see chart for details).      47 yo male with h/o non insulin dependent DM and recent R BKA by Dr Sharol Given presents for wound dehiscence. See picture. No systemic or clinical signs to raise suspicion for infection. Mechanical fall today w/o head trauma, LOC, headache. CT scan head/cervical spine not thought to be indicated.   Spoke to Dr Erlinda Hong who reviewed patient's chart and picture. Recommends admission to medicine team and evaluation by Dr  Sharol Given tomorrow morning. Pending lab before admission.   Final Clinical Impressions(s) / ED Diagnoses   Final diagnoses:  None    ED Discharge Orders    None       Kinnie Feil, PA-C 11/23/17 2353    Fatima Blank, MD 11/27/17 [phone]

## 2017-11-23 NOTE — ED Notes (Signed)
Non-adherent dressing applied to pt's wound by PA. Wrapped with abd pads and gauze.

## 2017-11-24 ENCOUNTER — Encounter (HOSPITAL_COMMUNITY): Payer: Self-pay | Admitting: General Practice

## 2017-11-24 ENCOUNTER — Encounter (HOSPITAL_COMMUNITY)
Admission: EM | Disposition: A | Discharge status: Skilled Nursing Facility | Payer: Self-pay | Source: Home / Self Care | Attending: Infectious Disease

## 2017-11-24 ENCOUNTER — Other Ambulatory Visit: Payer: Self-pay

## 2017-11-24 ENCOUNTER — Observation Stay (HOSPITAL_COMMUNITY): Payer: Self-pay | Admitting: Anesthesiology

## 2017-11-24 DIAGNOSIS — W19XXXA Unspecified fall, initial encounter: Secondary | ICD-10-CM

## 2017-11-24 DIAGNOSIS — D649 Anemia, unspecified: Secondary | ICD-10-CM | POA: Diagnosis present

## 2017-11-24 DIAGNOSIS — I152 Hypertension secondary to endocrine disorders: Secondary | ICD-10-CM | POA: Diagnosis present

## 2017-11-24 DIAGNOSIS — T8130XA Disruption of wound, unspecified, initial encounter: Secondary | ICD-10-CM | POA: Diagnosis present

## 2017-11-24 DIAGNOSIS — R296 Repeated falls: Secondary | ICD-10-CM | POA: Diagnosis present

## 2017-11-24 DIAGNOSIS — E1165 Type 2 diabetes mellitus with hyperglycemia: Secondary | ICD-10-CM

## 2017-11-24 DIAGNOSIS — I10 Essential (primary) hypertension: Secondary | ICD-10-CM | POA: Diagnosis present

## 2017-11-24 HISTORY — PX: AMPUTATION: SHX166

## 2017-11-24 HISTORY — DX: Disruption of wound, unspecified, initial encounter: T81.30XA

## 2017-11-24 LAB — CBC WITH DIFFERENTIAL/PLATELET
Basophils Absolute: 0 10*3/uL (ref 0.0–0.1)
Basophils Relative: 0 %
Eosinophils Absolute: 0.2 10*3/uL (ref 0.0–0.7)
Eosinophils Relative: 2 %
HCT: 30.5 % — ABNORMAL LOW (ref 39.0–52.0)
Hemoglobin: 9.9 g/dL — ABNORMAL LOW (ref 13.0–17.0)
Lymphocytes Relative: 32 %
Lymphs Abs: 2.7 10*3/uL (ref 0.7–4.0)
MCH: 27 pg (ref 26.0–34.0)
MCHC: 32.5 g/dL (ref 30.0–36.0)
MCV: 83.3 fL (ref 78.0–100.0)
Monocytes Absolute: 0.5 10*3/uL (ref 0.1–1.0)
Monocytes Relative: 6 %
Neutro Abs: 5 10*3/uL (ref 1.7–7.7)
Neutrophils Relative %: 60 %
Platelets: 229 10*3/uL (ref 150–400)
RBC: 3.66 MIL/uL — ABNORMAL LOW (ref 4.22–5.81)
RDW: 15 % (ref 11.5–15.5)
WBC: 8.4 10*3/uL (ref 4.0–10.5)

## 2017-11-24 LAB — PROTIME-INR
INR: 1.04
Prothrombin Time: 13.5 s (ref 11.4–15.2)

## 2017-11-24 LAB — GLUCOSE, CAPILLARY
Glucose-Capillary: 120 mg/dL — ABNORMAL HIGH (ref 65–99)
Glucose-Capillary: 107 mg/dL — ABNORMAL HIGH (ref 65–99)
Glucose-Capillary: 117 mg/dL — ABNORMAL HIGH (ref 65–99)
Glucose-Capillary: 172 mg/dL — ABNORMAL HIGH (ref 65–99)
Glucose-Capillary: 175 mg/dL — ABNORMAL HIGH (ref 65–99)

## 2017-11-24 LAB — APTT: aPTT: 35 s (ref 24–36)

## 2017-11-24 LAB — BASIC METABOLIC PANEL
Anion gap: 9 (ref 5–15)
BUN: 18 mg/dL (ref 6–20)
CO2: 23 mmol/L (ref 22–32)
Calcium: 8.7 mg/dL — ABNORMAL LOW (ref 8.9–10.3)
Chloride: 104 mmol/L (ref 101–111)
Creatinine, Ser: 0.98 mg/dL (ref 0.61–1.24)
GFR calc Af Amer: >60 mL/min (ref >60)
GFR calc non Af Amer: >60 mL/min (ref >60)
Glucose, Bld: 80 mg/dL (ref 65–99)
Potassium: 3.6 mmol/L (ref 3.5–5.1)
Sodium: 136 mmol/L (ref 135–145)

## 2017-11-24 SURGERY — AMPUTATION BELOW KNEE
Anesthesia: General | Laterality: Right

## 2017-11-24 MED ORDER — POLYETHYLENE GLYCOL 3350 17 G PO PACK
17.0000 g | PACK | Freq: Every day | ORAL | Status: DC | PRN
  Administered 2017-11-27: 17 g via ORAL
  Filled 2017-11-24: qty 1

## 2017-11-24 MED ORDER — HYDROMORPHONE HCL 1 MG/ML IJ SOLN
INTRAMUSCULAR | Status: AC
  Administered 2017-11-24: 0.5 mg via INTRAVENOUS
  Filled 2017-11-24: qty 1

## 2017-11-24 MED ORDER — ONDANSETRON HCL 4 MG/2ML IJ SOLN: 4.0000 mg | Freq: Four times a day (QID) | INTRAMUSCULAR | Status: DC | PRN

## 2017-11-24 MED ORDER — ACETAMINOPHEN 325 MG PO TABS: 650.0000 mg | ORAL_TABLET | Freq: Four times a day (QID) | ORAL | Status: DC | PRN

## 2017-11-24 MED ORDER — INSULIN ASPART 100 UNIT/ML ~~LOC~~ SOLN
0.0000 [IU] | Freq: Every day | SUBCUTANEOUS | Status: DC
Start: 2017-11-24 — End: 2017-11-30
  Administered 2017-11-26: 2 [IU] via SUBCUTANEOUS

## 2017-11-24 MED ORDER — MAGNESIUM CITRATE PO SOLN
1.0000 | Freq: Once | ORAL | Status: AC | PRN
  Administered 2017-11-27: 1 via ORAL
  Filled 2017-11-24: qty 296

## 2017-11-24 MED ORDER — ONDANSETRON HCL 4 MG/2ML IJ SOLN
INTRAMUSCULAR | Status: DC | PRN
  Administered 2017-11-24: 4 mg via INTRAVENOUS

## 2017-11-24 MED ORDER — ONDANSETRON HCL 4 MG PO TABS: 4.0000 mg | ORAL_TABLET | Freq: Four times a day (QID) | ORAL | Status: DC | PRN

## 2017-11-24 MED ORDER — LIDOCAINE HCL (CARDIAC) 20 MG/ML IV SOLN
INTRAVENOUS | Status: DC | PRN
  Administered 2017-11-24: 30 mg via INTRAVENOUS

## 2017-11-24 MED ORDER — GABAPENTIN 300 MG PO CAPS
300.0000 mg | ORAL_CAPSULE | Freq: Three times a day (TID) | ORAL | Status: DC
  Administered 2017-11-24 – 2017-11-30 (×18): 300 mg via ORAL
  Filled 2017-11-24 (×18): qty 1

## 2017-11-24 MED ORDER — ONDANSETRON HCL 4 MG/2ML IJ SOLN: 4.0000 mg | Freq: Once | INTRAMUSCULAR | Status: DC | PRN

## 2017-11-24 MED ORDER — OXYCODONE HCL 5 MG PO TABS
ORAL_TABLET | ORAL | Status: AC
  Filled 2017-11-24: qty 2

## 2017-11-24 MED ORDER — ACETAMINOPHEN 650 MG RE SUPP: 650.0000 mg | Freq: Four times a day (QID) | RECTAL | Status: DC | PRN

## 2017-11-24 MED ORDER — POVIDONE-IODINE 10 % EX SWAB: 2.0000 "application " | Freq: Once | CUTANEOUS | Status: DC

## 2017-11-24 MED ORDER — METOCLOPRAMIDE HCL 5 MG/ML IJ SOLN: 5.0000 mg | Freq: Three times a day (TID) | INTRAMUSCULAR | Status: DC | PRN

## 2017-11-24 MED ORDER — SODIUM CHLORIDE 0.9 % IV SOLN: INTRAVENOUS | Status: DC

## 2017-11-24 MED ORDER — MEPERIDINE HCL 25 MG/ML IJ SOLN: 6.2500 mg | INTRAMUSCULAR | Status: DC | PRN

## 2017-11-24 MED ORDER — CEFAZOLIN SODIUM-DEXTROSE 2-4 GM/100ML-% IV SOLN
2.0000 g | INTRAVENOUS | Status: AC
  Administered 2017-11-24: 2 g via INTRAVENOUS
  Filled 2017-11-24: qty 100

## 2017-11-24 MED ORDER — PROPOFOL 10 MG/ML IV BOLUS
INTRAVENOUS | Status: DC | PRN
  Administered 2017-11-24: 200 mg via INTRAVENOUS

## 2017-11-24 MED ORDER — INSULIN ASPART 100 UNIT/ML ~~LOC~~ SOLN
0.0000 [IU] | Freq: Three times a day (TID) | SUBCUTANEOUS | Status: DC
  Administered 2017-11-27: 5 [IU] via SUBCUTANEOUS
  Administered 2017-11-27 – 2017-11-28 (×3): 3 [IU] via SUBCUTANEOUS

## 2017-11-24 MED ORDER — ACETAMINOPHEN 650 MG RE SUPP: 650.0000 mg | RECTAL | Status: DC | PRN

## 2017-11-24 MED ORDER — AMLODIPINE BESYLATE 10 MG PO TABS
10.0000 mg | ORAL_TABLET | Freq: Every day | ORAL | Status: DC
  Administered 2017-11-24 – 2017-11-26 (×3): 10 mg via ORAL
  Filled 2017-11-24 (×3): qty 1

## 2017-11-24 MED ORDER — METHOCARBAMOL 500 MG PO TABS
500.0000 mg | ORAL_TABLET | Freq: Four times a day (QID) | ORAL | Status: DC | PRN
  Administered 2017-11-24 – 2017-11-30 (×12): 500 mg via ORAL
  Filled 2017-11-24 (×12): qty 1

## 2017-11-24 MED ORDER — METHOCARBAMOL 500 MG PO TABS
500.0000 mg | ORAL_TABLET | Freq: Four times a day (QID) | ORAL | Status: DC | PRN
  Administered 2017-11-24: 500 mg via ORAL
  Filled 2017-11-24: qty 1

## 2017-11-24 MED ORDER — ACETAMINOPHEN 325 MG PO TABS
650.0000 mg | ORAL_TABLET | ORAL | Status: DC | PRN
  Administered 2017-11-26: 650 mg via ORAL
  Filled 2017-11-24: qty 2

## 2017-11-24 MED ORDER — METHOCARBAMOL 1000 MG/10ML IJ SOLN
500.0000 mg | Freq: Four times a day (QID) | INTRAVENOUS | Status: DC | PRN
  Filled 2017-11-24: qty 5

## 2017-11-24 MED ORDER — DOCUSATE SODIUM 100 MG PO CAPS
100.0000 mg | ORAL_CAPSULE | Freq: Two times a day (BID) | ORAL | Status: DC
  Administered 2017-11-24 – 2017-11-29 (×9): 100 mg via ORAL
  Filled 2017-11-24 (×12): qty 1

## 2017-11-24 MED ORDER — CEFAZOLIN SODIUM-DEXTROSE 1-4 GM/50ML-% IV SOLN
1.0000 g | Freq: Four times a day (QID) | INTRAVENOUS | Status: AC
  Administered 2017-11-24 – 2017-11-25 (×3): 1 g via INTRAVENOUS
  Filled 2017-11-24 (×3): qty 50

## 2017-11-24 MED ORDER — FENTANYL CITRATE (PF) 250 MCG/5ML IJ SOLN
INTRAMUSCULAR | Status: AC
  Filled 2017-11-24: qty 5

## 2017-11-24 MED ORDER — CHLORHEXIDINE GLUCONATE 4 % EX LIQD: 60.0000 mL | Freq: Once | CUTANEOUS | Status: DC

## 2017-11-24 MED ORDER — FENTANYL CITRATE (PF) 100 MCG/2ML IJ SOLN
INTRAMUSCULAR | Status: DC | PRN
  Administered 2017-11-24: 100 ug via INTRAVENOUS
  Administered 2017-11-24: 50 ug via INTRAVENOUS

## 2017-11-24 MED ORDER — METOCLOPRAMIDE HCL 5 MG PO TABS: 5.0000 mg | ORAL_TABLET | Freq: Three times a day (TID) | ORAL | Status: DC | PRN

## 2017-11-24 MED ORDER — OXYCODONE HCL 5 MG PO TABS
10.0000 mg | ORAL_TABLET | Freq: Four times a day (QID) | ORAL | Status: DC | PRN
  Administered 2017-11-24 (×2): 10 mg via ORAL
  Filled 2017-11-24: qty 2

## 2017-11-24 MED ORDER — LACTATED RINGERS IV SOLN
INTRAVENOUS | Status: DC
  Administered 2017-11-24: 11:00:00 via INTRAVENOUS

## 2017-11-24 MED ORDER — OXYCODONE HCL 5 MG PO TABS: 5.0000 mg | ORAL_TABLET | ORAL | Status: DC | PRN

## 2017-11-24 MED ORDER — HYDROMORPHONE HCL 1 MG/ML IJ SOLN
0.2500 mg | INTRAMUSCULAR | Status: DC | PRN
  Administered 2017-11-24: 0.5 mg via INTRAVENOUS

## 2017-11-24 MED ORDER — HYDROMORPHONE HCL 1 MG/ML IJ SOLN
1.0000 mg | INTRAMUSCULAR | Status: DC | PRN
  Administered 2017-11-24 – 2017-11-25 (×2): 1 mg via INTRAVENOUS
  Filled 2017-11-24 (×2): qty 1

## 2017-11-24 MED ORDER — ENOXAPARIN SODIUM 40 MG/0.4ML ~~LOC~~ SOLN
40.0000 mg | SUBCUTANEOUS | Status: DC
  Administered 2017-11-24: 40 mg via SUBCUTANEOUS
  Filled 2017-11-24: qty 0.4

## 2017-11-24 MED ORDER — BACLOFEN 10 MG PO TABS
10.0000 mg | ORAL_TABLET | Freq: Three times a day (TID) | ORAL | Status: DC
  Administered 2017-11-24 – 2017-11-30 (×18): 10 mg via ORAL
  Filled 2017-11-24 (×18): qty 1

## 2017-11-24 MED ORDER — LACTATED RINGERS IV SOLN
INTRAVENOUS | Status: DC | PRN
  Administered 2017-11-24: 12:00:00 via INTRAVENOUS

## 2017-11-24 MED ORDER — OXYCODONE HCL 5 MG PO TABS
10.0000 mg | ORAL_TABLET | ORAL | Status: DC | PRN
  Administered 2017-11-24 – 2017-11-30 (×21): 10 mg via ORAL
  Filled 2017-11-24 (×22): qty 2

## 2017-11-24 MED ORDER — ONDANSETRON HCL 4 MG/2ML IJ SOLN
4.0000 mg | Freq: Four times a day (QID) | INTRAMUSCULAR | Status: DC | PRN
  Administered 2017-11-26 – 2017-11-30 (×3): 4 mg via INTRAVENOUS
  Filled 2017-11-24 (×3): qty 2

## 2017-11-24 MED ORDER — BISACODYL 10 MG RE SUPP: 10.0000 mg | Freq: Every day | RECTAL | Status: DC | PRN

## 2017-11-24 SURGICAL SUPPLY — 31 items
BLADE SAW RECIP 87.9 MT (BLADE) ×1 IMPLANT
BLADE SURG 21 STRL SS (BLADE) ×2 IMPLANT
BNDG COHESIVE 6X5 TAN STRL LF (GAUZE/BANDAGES/DRESSINGS) ×3 IMPLANT
BNDG GAUZE ELAST 4 BULKY (GAUZE/BANDAGES/DRESSINGS) ×3 IMPLANT
COVER SURGICAL LIGHT HANDLE (MISCELLANEOUS) ×2 IMPLANT
CUFF TOURNIQUET SINGLE 34IN LL (TOURNIQUET CUFF) IMPLANT
CUFF TOURNIQUET SINGLE 44IN (TOURNIQUET CUFF) IMPLANT
DRAPE INCISE IOBAN 66X45 STRL (DRAPES) ×1 IMPLANT
DRAPE U-SHAPE 47X51 STRL (DRAPES) ×2 IMPLANT
DRESSING PREVENA PLUS CUSTOM (GAUZE/BANDAGES/DRESSINGS) ×1 IMPLANT
DRSG PREVENA PLUS CUSTOM (GAUZE/BANDAGES/DRESSINGS) ×2
ELECT REM PT RETURN 9FT ADLT (ELECTROSURGICAL) ×2
ELECTRODE REM PT RTRN 9FT ADLT (ELECTROSURGICAL) ×1 IMPLANT
GLOVE BIOGEL PI IND STRL 9 (GLOVE) ×1 IMPLANT
GLOVE BIOGEL PI INDICATOR 9 (GLOVE) ×1
GLOVE SURG ORTHO 9.0 STRL STRW (GLOVE) ×2 IMPLANT
GOWN STRL REUS W/ TWL XL LVL3 (GOWN DISPOSABLE) ×2 IMPLANT
GOWN STRL REUS W/TWL XL LVL3 (GOWN DISPOSABLE) ×4
KIT BASIN OR (CUSTOM PROCEDURE TRAY) ×2 IMPLANT
KIT ROOM TURNOVER OR (KITS) ×2 IMPLANT
MANIFOLD NEPTUNE II (INSTRUMENTS) ×2 IMPLANT
NS IRRIG 1000ML POUR BTL (IV SOLUTION) ×2 IMPLANT
PACK ORTHO EXTREMITY (CUSTOM PROCEDURE TRAY) ×2 IMPLANT
PAD ARMBOARD 7.5X6 YLW CONV (MISCELLANEOUS) ×2 IMPLANT
SPONGE LAP 18X18 X RAY DECT (DISPOSABLE) IMPLANT
STAPLER VISISTAT 35W (STAPLE) IMPLANT
STOCKINETTE IMPERVIOUS LG (DRAPES) ×2 IMPLANT
SUT SILK 2 0 (SUTURE) ×2
SUT SILK 2-0 18XBRD TIE 12 (SUTURE) ×1 IMPLANT
SUT VIC AB 1 CTX 27 (SUTURE) ×1 IMPLANT
WND VAC CANISTER 500ML (MISCELLANEOUS) ×1 IMPLANT

## 2017-11-24 NOTE — Anesthesia Procedure Notes (Signed)
Procedure Name: LMA Insertion Date/Time: 11/24/2017 12:16 PM Performed by: Eligha Bridegroom, CRNA Pre-anesthesia Checklist: Patient identified, Emergency Drugs available, Suction available and Patient being monitored Patient Re-evaluated:Patient Re-evaluated prior to induction Oxygen Delivery Method: Circle system utilized Preoxygenation: Pre-oxygenation with 100% oxygen Induction Type: IV induction LMA: LMA inserted LMA Size: 5.0 Number of attempts: 1 Placement Confirmation: positive ETCO2 and breath sounds checked- equal and bilateral Tube secured with: Tape Dental Injury: Teeth and Oropharynx as per pre-operative assessment

## 2017-11-24 NOTE — Progress Notes (Signed)
  Pt admitted to the unit. Pt is stable, alert and oriented per baseline. Oriented to room, staff, and call bell. Educated to call for any assistance. Bed in lowest position, call bell within reach- will continue to monitor. 

## 2017-11-24 NOTE — Consult Note (Signed)
ORTHOPAEDIC CONSULTATION  REQUESTING PHYSICIAN: Geradine Girt, DO  Chief Complaint: Traumatic dehiscence right transtibial amputation.  HPI: [name] [name] is a 47 y.o. male who presents with traumatic dehiscence right transtibial amputation.  Patient states that after surgery he was in rehabilitation and [place] he went home fell on the residual limb with traumatic dehiscence.  Patient was seen by his home health nurse and referred to the hospital for admission and treatment.  Past Medical History:  Diagnosis Date  . ARF (acute renal failure) ([place]) 09/2017  . Diabetes mellitus without complication ([place])   . Hypertension    Past Surgical History:  Procedure Laterality Date  . AMPUTATION Right 10/13/2017   Procedure: RIGHT BELOW KNEE AMPUTATION;  Surgeon: [name], MD;  Location: [facility];  Service: Orthopedics;  Laterality: Right;  . NO PAST SURGERIES     Social History   Socioeconomic History  . Marital status: Divorced    Spouse name: None  . Number of children: 1  . Years of education: 55  . Highest education level: Associate degree: occupational, Hotel manager, or vocational program  Social Needs  . Financial resource strain: Somewhat hard  . Food insecurity - worry: None  . Food insecurity - inability: None  . Transportation needs - medical: None  . Transportation needs - non-medical: None  Occupational History  . Occupation: unemployed  Tobacco Use  . Smoking status: Former Smoker    Packs/day: 0.25    Years: 35.00    Pack years: 8.75    Types: Cigarettes  . Smokeless tobacco: Never Used  . Tobacco comment: stopped, but back on 1 cigarette daily.  Substance and Sexual Activity  . Alcohol use: No    Frequency: Never  . Drug use: No  . Sexual activity: None  Other Topics Concern  . None  Social History Narrative   Originally from  [place].   Is living with his mother, who essentially supports him now.   Family History  Problem Relation Age of  Onset  . Diabetes Mellitus II Mother   . Depression Mother   . Heart disease Father        CABG x 4.  04/2017   - negative except otherwise stated in the family history section Allergies  Allergen Reactions  . Adhesive [Tape] Rash    Rash at site of tape--okay to use paper tape   Prior to Admission medications   Medication Sig Start Date End Date Taking? Authorizing Provider  amLODipine (NORVASC) 10 MG tablet Take 1 tablet (10 mg total) by mouth daily. 11/19/17  Yes Mack Hook, MD  baclofen (LIORESAL) 10 MG tablet Take 10 mg by mouth 3 (three) times daily.    Yes [provider]  gabapentin (NEURONTIN) 300 MG capsule 1 cap by mouth in morning and midday, 2 caps by mouth at bedtime Patient taking differently: Take 300 mg by mouth 3 (three) times daily.  11/19/17  Yes Mack Hook, MD  glipiZIDE (GLUCOTROL) 5 MG tablet 1/2 tab by mouth twice daily with meal Patient taking differently: Take 2.5 mg by mouth 2 (two) times daily with a meal.  11/19/17  Yes Mack Hook, MD  metFORMIN (GLUCOPHAGE) 500 MG tablet Take 1 tablet (500 mg total) by mouth 2 (two) times daily with a meal. 11/19/17  Yes Mack Hook, MD  methocarbamol (ROBAXIN) 500 MG tablet Take 500 mg by mouth every 6 (six) hours as needed for muscle spasms.   Yes [provider]  oxyCODONE  10 MG TABS Take 1 tablet (10 mg total) by mouth 2 (two) times daily. Patient taking differently: Take 10 mg by mouth every 6 (six) hours as needed.  10/22/17  Yes Hall, Carole N, DO  AGAMATRIX ULTRA-THIN LANCETS MISC Check blood sugars before meals twice daily 11/19/17   Mack Hook, MD  Blood Glucose Monitoring Suppl [facility] PRESTO) w/Device KIT Check sugars twice daily before meals 11/19/17   Mack Hook, MD  glucose blood (AGAMATRIX PRESTO TEST) test strip Check blood sugars twice daily before meals 11/19/17   Mack Hook, MD   No results found. - pertinent xrays, CT, MRI  studies were reviewed and independently interpreted  Positive ROS: All other systems have been reviewed and were otherwise negative with the exception of those mentioned in the HPI and as above.  Physical Exam: General: Alert, no acute distress Psychiatric: Patient is competent for consent with normal mood and affect Lymphatic: No axillary or cervical lymphadenopathy Cardiovascular: No pedal edema Respiratory: No cyanosis, no use of accessory musculature GI: No organomegaly, abdomen is soft and non-tender  Skin:    Neurologic: Patient does not have protective sensation bilateral lower extremities.   MUSCULOSKELETAL:  Examination patient is alert oriented no adenopathy well-dressed normal affect normal respiratory effort he has dehiscence of the transtibial amputation on the right there is no cellulitis no odor no drainage.  There are no gangrenous changes.  Assessment: Assessment: Dehiscence traumatic right transtibial amputation.  Plan: Plan: We will plan for revision of the amputation risks and benefits were discussed including infection neurovascular injury pain DVT need for additional surgery.  Patient states he understands wished to proceed at this time.  Thank you for the consult and the opportunity to see Mr. [name] [name], [place] [phone] 8:04 AM

## 2017-11-24 NOTE — Op Note (Signed)
11/24/2017  12:47 PM  PATIENT:  [name] [name]    PRE-OPERATIVE DIAGNOSIS:  DEHISCENCE RIGHT BKA  POST-OPERATIVE DIAGNOSIS:  Same  PROCEDURE:  AMPUTATION BELOW KNEE REVISION  Application Praveena wound VAC.    SURGEON:  [name], MD  PHYSICIAN ASSISTANT:None ANESTHESIA:   General  PREOPERATIVE INDICATIONS:  [name] [name] is a  47 y.o. male with a diagnosis of DEHISCENCE RIGHT BKA who failed conservative measures and elected for surgical management.    The risks benefits and alternatives were discussed with the patient preoperatively including but not limited to the risks of infection, bleeding, nerve injury, cardiopulmonary complications, the need for revision surgery, among others, and the patient was willing to proceed.  OPERATIVE IMPLANTS: Praveena wound VAC.  OPERATIVE FINDINGS: Necrotic muscle and hematoma extending to bone.  OPERATIVE PROCEDURE: Patient was brought to the operating room and underwent a general anesthetic.  After adequate levels of anesthesia were obtained patient's right lower extremity was prepped using DuraPrep draped into a sterile field.  A timeout was called.  Elliptical incision was made around the dehisced surgical incision.  This was carried sharply down to bone.  The distal 2 cm of the tibia and fibula were resected and the necrotic tissue and bone was resected in one block of tissue.  There is no deep abscess no signs of infection.  Electrocautery was used for hemostasis.  The wound was irrigated with normal saline.  The deep and superficial fascial layers and skin was closed using 2-0 nylon.  A Praveena wound VAC was applied this had a good suction fit patient was extubated taken to the PACU in stable condition.   DISCHARGE PLANNING:  Antibiotic duration: 24 hours postoperatively.  Weightbearing: Nonweightbearing on the right.  Pain medication: As needed.  Dressing care/ Wound VAC: Continue wound VAC until discharge and then dry  dressing changes.  Ambulatory devices: Walker.  Discharge to: Anticipate patient will require discharge to skilled nursing.  Follow-up: In the office 1 week post operative.

## 2017-11-24 NOTE — Progress Notes (Signed)
Patient admitted after midnight, please see H&P.  Note by Dr. Amil Amen reviewed from 12/28: Discussed I would not continue to refill his Oxycodone.  He needs to decrease to 5 mg (1/2 tab) 3 times daily and then wean by 1/2 tab daily thereafter. Also would like for him to utilize Methocarbamol only as needed and start wean of baclofen as well.  Plan for revision of revision of the amputation by Dr. Sande Brothers DO

## 2017-11-24 NOTE — Progress Notes (Signed)
PT Cancellation Note  Patient Details Name: [name] [name] MRN: [id number] DOB: 1971-10-06   Cancelled Treatment:    Reason Eval/Treat Not Completed: Patient declined, no reason specified Patient refused PT eval tonight, appeared distraught and asked I come tomorrow. "I just had surgery today and dont do therapy at night". Pt agreeable for eval to be performed tomorrow.   Reinaldo Berber, PT, DPT Acute Rehab Services Pager: [phone]     Reinaldo Berber 11/24/2017, 6:09 PM

## 2017-11-24 NOTE — Progress Notes (Signed)
Patient to OR at this time

## 2017-11-24 NOTE — H&P (Signed)
History and Physical    [name] [name] WPV:[id number] DOB: 24-Dec-1970 DOA: 11/23/2017  Referring MD/NP/PA: Carmon Sails, PA-C PCP: Mack Hook, MD  Patient coming from:   Chief Complaint: Open wound  I have personally briefly reviewed patient's old medical records in [place]   HPI: [name] [name] is a 47 y.o. 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 Sharol Given; who presents today after having a fall 2 days ago with open wound of right BKA observed by his home health nurse while coming to change dressing yesterday 1/1 at 1300.  Patient actually notes that he has had multiple falls since being discharged from hospital.  Notes that he has hit his head and likely landed on his right leg during his falls, but does not report any loss of consciousness.  He reports localized pain to the area that he describes as sharp, and some bleeding.  Denies having any other complaints at this time.  ED Course: Patient given Ancef 1 g IV x1 dose on the emergency department.  Dr. Erlinda Hong was consulted and recommended for [facility] to admit.  Review of Systems  Constitutional: Negative for chills and fever.  HENT: Negative for ear discharge and ear pain.   Eyes: Negative for double vision and photophobia.  Respiratory: Negative for cough and shortness of breath.   Cardiovascular: Negative for chest pain and claudication.  Gastrointestinal: Negative for abdominal pain, nausea and vomiting.  Genitourinary: Negative for dysuria and frequency.  Musculoskeletal: Positive for falls.  Skin: Negative for itching and rash.       Positive for open wound.  Neurological: Positive for focal weakness. Negative for loss of consciousness and headaches.  Psychiatric/Behavioral: Negative for hallucinations and suicidal ideas.    Past Medical History:  Diagnosis  Date  . ARF (acute renal failure) ([place]) 09/2017  . Diabetes mellitus without complication ([place])   . Hypertension     Past Surgical History:  Procedure Laterality Date  . AMPUTATION Right 10/13/2017   Procedure: RIGHT BELOW KNEE AMPUTATION;  Surgeon: [name], MD;  Location: [place];  Service: Orthopedics;  Laterality: Right;  . NO PAST SURGERIES       reports that he has quit smoking. His smoking use included cigarettes. He has a 8.75 pack-year smoking history. he has never used smokeless tobacco. He reports that he does not drink alcohol or use drugs.  Allergies  Allergen Reactions  . Adhesive [Tape] Rash    Rash at site of tape--okay to use paper tape    Family History  Problem Relation Age of Onset  . Diabetes Mellitus II Mother   . Depression Mother   . Heart disease Father        CABG x 4.  04/2017    Prior to Admission medications   Medication Sig Start Date End Date Taking? Authorizing Provider  amLODipine (NORVASC) 10 MG tablet Take 1 tablet (10 mg total) by mouth daily. 11/19/17  Yes Mack Hook, MD  baclofen (LIORESAL) 10 MG tablet Take 10 mg by mouth 3 (three) times daily.    Yes [provider]  gabapentin (NEURONTIN) 300 MG capsule 1 cap by mouth in morning and midday, 2 caps by mouth at bedtime Patient taking differently: Take 300 mg by mouth 3 (three) times daily.  11/19/17  Yes Mack Hook, MD  glipiZIDE (GLUCOTROL) 5 MG tablet  1/2 tab by mouth twice daily with meal Patient taking differently: Take 2.5 mg by mouth 2 (two) times daily with a meal.  11/19/17  Yes Mack Hook, MD  metFORMIN (GLUCOPHAGE) 500 MG tablet Take 1 tablet (500 mg total) by mouth 2 (two) times daily with a meal. 11/19/17  Yes Mack Hook, MD  methocarbamol (ROBAXIN) 500 MG tablet Take 500 mg by mouth every 6 (six) hours as needed for muscle spasms.   Yes [provider]  oxyCODONE 10 MG TABS Take 1 tablet (10 mg total) by mouth 2 (two)  times daily. Patient taking differently: Take 10 mg by mouth every 6 (six) hours as needed.  10/22/17  Yes Hall, Lorenda Cahill, DO  AGAMATRIX ULTRA-THIN LANCETS MISC Check blood sugars before meals twice daily 11/19/17   Mack Hook, MD  Blood Glucose Monitoring Suppl [facility] PRESTO) w/Device KIT Check sugars twice daily before meals 11/19/17   Mack Hook, MD  glucose blood (AGAMATRIX PRESTO TEST) test strip Check blood sugars twice daily before meals 11/19/17   Mack Hook, MD    Physical Exam:  Constitutional: NAD, calm, comfortable Vitals:   11/23/17 2345 11/24/17 0000 11/24/17 0015 11/24/17 0030  BP: 126/83 125/82 127/86 128/86  Pulse: 100 95 94 98  Resp:    16  Temp:      TempSrc:      SpO2: 98% 97% 98% 98%   Eyes: PERRL, lids and conjunctivae normal ENMT: Mucous membranes are moist. Posterior pharynx clear of any exudate or lesions.Normal dentition.  Neck: normal, supple, no masses, no thyromegaly Respiratory: clear to auscultation bilaterally, no wheezing, no crackles. Normal respiratory effort. No accessory muscle use.  Cardiovascular: Regular rate and rhythm, no murmurs / rubs / gallops. No extremity edema. 2+ pedal pulses. No carotid bruits.  Abdomen: no tenderness, no masses palpated. No hepatosplenomegaly. Bowel sounds positive.  Musculoskeletal: no clubbing / cyanosis.  Right BKA currently dressed. Skin: Calluses noted to the plantar aspect of the first and fifth digits of the left foot. Neurologic: CN 2-12 grossly intact. Sensation normal, DTR normal. Strength 5/5 in all 4.  Psychiatric: Normal judgment and insight. Alert and oriented x 3. Normal mood.     Labs on Admission: I have personally reviewed following labs and imaging studies  CBC: Recent Labs  Lab 11/23/17 2332  WBC 8.4  NEUTROABS 5.0  HGB 9.9*  HCT 30.5*  MCV 83.3  PLT 585   Basic Metabolic Panel: Recent Labs  Lab 11/23/17 2332  NA 136  K 3.6  CL 104  CO2 23    GLUCOSE 80  BUN 18  CREATININE 0.98  CALCIUM 8.7*   GFR: Estimated Creatinine Clearance: 122.9 mL/min (by C-G formula based on SCr of 0.98 mg/dL). Liver Function Tests: No results for input(s): AST, ALT, ALKPHOS, BILITOT, PROT, ALBUMIN in the last 168 hours. No results for input(s): LIPASE, AMYLASE in the last 168 hours. No results for input(s): AMMONIA in the last 168 hours. Coagulation Profile: No results for input(s): INR, PROTIME in the last 168 hours. Cardiac Enzymes: No results for input(s): CKTOTAL, CKMB, CKMBINDEX, TROPONINI in the last 168 hours. BNP (last 3 results) No results for input(s): PROBNP in the last 8760 hours. HbA1C: No results for input(s): HGBA1C in the last 72 hours. CBG: No results for input(s): GLUCAP in the last 168 hours. Lipid Profile: No results for input(s): CHOL, HDL, LDLCALC, TRIG, CHOLHDL, LDLDIRECT in the last 72 hours. Thyroid Function Tests: No results for input(s): TSH, T4TOTAL, FREET4,  T3FREE, THYROIDAB in the last 72 hours. Anemia Panel: No results for input(s): VITAMINB12, FOLATE, FERRITIN, TIBC, IRON, RETICCTPCT in the last 72 hours. Urine analysis:    Component Value Date/Time   COLORURINE YELLOW 10/11/2017 [place] 10/11/2017 2342   LABSPEC 1.026 10/11/2017 2342   PHURINE 5.0 10/11/2017 2342   GLUCOSEU >=500 (A) 10/11/2017 2342   HGBUR MODERATE (A) 10/11/2017 2342   BILIRUBINUR NEGATIVE 10/11/2017 2342   KETONESUR 80 (A) 10/11/2017 2342   PROTEINUR 30 (A) 10/11/2017 2342   NITRITE NEGATIVE 10/11/2017 2342   LEUKOCYTESUR NEGATIVE 10/11/2017 2342   Sepsis Labs: No results found for this or any previous visit (from the past 240 hour(s)).   Radiological Exams on Admission: No results found.    Assessment/Plan Wound dehiscence 2/2 Falls: Patient apparently fell 2-3 days ago and presents with signs of wound dehiscence.  Patient given 1 dose of Ancef while in the emergency department. - Admit to MedSurg bed -  Appreciate orthopedic consultative services, follow-up for further recommendation - Continue home pain medications  Diabetes mellitus type 2 with complications of peripheral neuropathy: Patient only on oral agents of metformin and glipizide.  Last hemoglobin A1c noted to be 11.1 on 10/11/2017. - Hypoglycemic protocols - Hold glipizide and metformin - Continue gabapentin - CBGs q. before meals and at bedtime with sensitive SSI  Essential hypertension - Continue amlodipine  Normocytic normochromic anemia: Hemoglobin noted to be 9.9 with previous baseline >10.  Suspect slightly lower than baseline due to bleeding wound. - Repeat CBC in a.m  DVT prophylaxis: Lovenox Code Status: Full Family Communication: None present at bedside Disposition Plan: To be determined Consults called: Orthopedics Admission status: Inpatient  Norval Morton MD [facility] Pager [phone]   If 7PM-7AM, please contact night-coverage [email or web address] Password [email or web address]  11/24/2017, 1:44 AM

## 2017-11-24 NOTE — Anesthesia Preprocedure Evaluation (Signed)
Anesthesia Evaluation  Patient identified by MRN, date of birth, ID band Patient awake    Reviewed: Allergy & Precautions, NPO status , Patient's Chart, lab work & pertinent test results  Airway Mallampati: I  TM Distance: >3 FB Neck ROM: Full    Dental   Pulmonary former smoker,    Pulmonary exam normal        Cardiovascular hypertension, Pt. on medications Normal cardiovascular exam     Neuro/Psych    GI/Hepatic   Endo/Other  diabetes, Type 2, Oral Hypoglycemic Agents  Renal/GU Renal InsufficiencyRenal disease     Musculoskeletal   Abdominal   Peds  Hematology   Anesthesia Other Findings   Reproductive/Obstetrics                             Anesthesia Physical Anesthesia Plan  ASA: III  Anesthesia Plan: General   Post-op Pain Management:    Induction: Intravenous  PONV Risk Score and Plan: 2 and Ondansetron and Treatment may vary due to age or medical condition  Airway Management Planned: LMA  Additional Equipment:   Intra-op Plan:   Post-operative Plan: Extubation in OR  Informed Consent: I have reviewed the patients History and Physical, chart, labs and discussed the procedure including the risks, benefits and alternatives for the proposed anesthesia with the patient or authorized representative who has indicated his/her understanding and acceptance.     Plan Discussed with: CRNA and Surgeon  Anesthesia Plan Comments:         Anesthesia Quick Evaluation

## 2017-11-24 NOTE — Transfer of Care (Signed)
Immediate Anesthesia Transfer of Care Note  Patient: [name] [name]  Procedure(s) Performed: AMPUTATION BELOW KNEE REVISION (Right )  Patient Location: PACU  Anesthesia Type:General  Level of Consciousness: awake, alert  and oriented  Airway & Oxygen Therapy: Patient Spontanous Breathing  Post-op Assessment: Report given to RN and Post -op Vital signs reviewed and stable  Post vital signs: Reviewed and stable  Last Vitals:  Vitals:   11/24/17 0313 11/24/17 1015  BP: (!) 147/96 (!) 143/90  Pulse: (!) 101 87  Resp:  16  Temp: 36.9 C 36.7 C  SpO2: 100% 98%    Last Pain:  Vitals:   11/24/17 1015  TempSrc: Oral  PainSc:          Complications: No apparent anesthesia complications

## 2017-11-24 NOTE — Anesthesia Postprocedure Evaluation (Signed)
Anesthesia Post Note  Patient: [name] [name]  Procedure(s) Performed: AMPUTATION BELOW KNEE REVISION (Right )     Patient location during evaluation: PACU Anesthesia Type: General Level of consciousness: awake and alert Pain management: pain level controlled Vital Signs Assessment: post-procedure vital signs reviewed and stable Respiratory status: spontaneous breathing, nonlabored ventilation, respiratory function stable and patient connected to nasal cannula oxygen Cardiovascular status: blood pressure returned to baseline and stable Postop Assessment: no apparent nausea or vomiting Anesthetic complications: no    Last Vitals:  Vitals:   11/24/17 1315 11/24/17 1330  BP: (!) 134/101 (!) 136/98  Pulse: 90 93  Resp: 14 13  Temp:  36.6 C  SpO2: 100% 100%    Last Pain:  Vitals:   11/24/17 1315  TempSrc:   PainSc: 10-Worst pain ever                 Zygmunt Mcglinn DAVID

## 2017-11-25 ENCOUNTER — Telehealth (INDEPENDENT_AMBULATORY_CARE_PROVIDER_SITE_OTHER): Payer: Self-pay

## 2017-11-25 ENCOUNTER — Encounter (HOSPITAL_COMMUNITY): Payer: Self-pay | Admitting: Orthopedic Surgery

## 2017-11-25 LAB — BASIC METABOLIC PANEL
Anion gap: 5 (ref 5–15)
BUN: 15 mg/dL (ref 6–20)
CO2: 27 mmol/L (ref 22–32)
Calcium: 8.4 mg/dL — ABNORMAL LOW (ref 8.9–10.3)
Chloride: 104 mmol/L (ref 101–111)
Creatinine, Ser: 1.13 mg/dL (ref 0.61–1.24)
GFR calc Af Amer: >60 mL/min (ref >60)
GFR calc non Af Amer: >60 mL/min (ref >60)
Glucose, Bld: 231 mg/dL — ABNORMAL HIGH (ref 65–99)
Potassium: 4.9 mmol/L (ref 3.5–5.1)
Sodium: 136 mmol/L (ref 135–145)

## 2017-11-25 LAB — CBC
HCT: 26.4 % — ABNORMAL LOW (ref 39.0–52.0)
Hemoglobin: 8.4 g/dL — ABNORMAL LOW (ref 13.0–17.0)
MCH: 26.8 pg (ref 26.0–34.0)
MCHC: 31.8 g/dL (ref 30.0–36.0)
MCV: 84.1 fL (ref 78.0–100.0)
Platelets: 199 10*3/uL (ref 150–400)
RBC: 3.14 MIL/uL — ABNORMAL LOW (ref 4.22–5.81)
RDW: 14.9 % (ref 11.5–15.5)
WBC: 8.1 10*3/uL (ref 4.0–10.5)

## 2017-11-25 LAB — GLUCOSE, CAPILLARY
Glucose-Capillary: 156 mg/dL — ABNORMAL HIGH (ref 65–99)
Glucose-Capillary: 209 mg/dL — ABNORMAL HIGH (ref 65–99)
Glucose-Capillary: 294 mg/dL — ABNORMAL HIGH (ref 65–99)
Glucose-Capillary: 282 mg/dL — ABNORMAL HIGH (ref 65–99)

## 2017-11-25 MED ORDER — GLIPIZIDE 5 MG PO TABS
2.5000 mg | ORAL_TABLET | Freq: Two times a day (BID) | ORAL | Status: DC
  Administered 2017-11-26 – 2017-11-30 (×9): 2.5 mg via ORAL
  Filled 2017-11-25 (×9): qty 1

## 2017-11-25 MED ORDER — METFORMIN HCL 500 MG PO TABS
500.0000 mg | ORAL_TABLET | Freq: Two times a day (BID) | ORAL | Status: DC
  Administered 2017-11-25 – 2017-11-30 (×10): 500 mg via ORAL
  Filled 2017-11-25 (×10): qty 1

## 2017-11-25 NOTE — Evaluation (Addendum)
Physical Therapy Evaluation Patient Details Name: [name] [name] MRN: [id number] DOB: 01-21-1971 Today's Date: 11/25/2017   History of Present Illness  47 y.o. male s/p R Revision of BKA 1/02, after fall at home. 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    Clinical Impression  Patient is s/p above surgery resulting in functional limitations due to the deficits listed below (see PT Problem List). PTA, patient was living with mother, ambulating with RW, and receiving support for some ADL's with home nursing. Patient has history of receent falls resulting in current hospitalization. Upon eval, patient presents with high levels of post op pain, generalized weakness in BLE, flat and irritable affect, and fatigue that all limit his mobility greatly at this time. Session focused on transferring out of bed, currently Min A level with transfers. Unable to progress ambulation due to pain and fatigue this session. Patient agreeable for short term SNF placement before return to home to improve functional mobility.  Patient will benefit from skilled PT to increase their independence and safety with mobility to allow discharge to the venue listed below.       Follow Up Recommendations SNF;Supervision/Assistance - 24 hour    Equipment Recommendations  None recommended by PT    Recommendations for Other Services       Precautions / Restrictions Precautions Precautions: Fall Precaution Comments: history of falls Restrictions Weight Bearing Restrictions: Yes RLE Weight Bearing: Non weight bearing      Mobility  Bed Mobility Overal bed mobility: Needs Assistance Bed Mobility: Supine to Sit     Supine to sit: Min guard     General bed mobility comments: Min Guard for safety as patient reports some dizziness with sitting up for first time  Transfers Overall transfer level: Needs assistance Equipment used: Rolling walker (2 wheeled) Transfers:  Sit to/from [employer] Sit to Stand: Min assist Stand pivot transfers: Min assist       General transfer comment: Min A x2 for safety to power up and stablize RW. Cues for hand placement. Patient prefers walker be slightly lower than recoemended by PT. Patient reports no dizziness standing but some at sitting. Cues for sequencing and Min A for stand pivot on LLE into bedside chair.   Ambulation/Gait             General Gait Details: Unable this session  Stairs            Wheelchair Mobility    Modified Rankin (Stroke Patients Only)       Balance Overall balance assessment: History of Falls                                           Pertinent Vitals/Pain Pain Assessment: 0-10 Pain Score: 8  Pain Location: R KNEE Pain Descriptors / Indicators: Aching;Discomfort;Grimacing Pain Intervention(s): Limited activity within patient's tolerance;Monitored during session;Premedicated before session    Home Living Family/patient expects to be discharged to:: Skilled nursing facility Living Arrangements: Parent Available Help at Discharge: Family;Available 24 hours/day Type of Home: House Home Access: Stairs to enter Entrance Stairs-Rails: Can reach both Entrance Stairs-Number of Steps: 3 Home Layout: One level Home Equipment: Walker - 2 wheels      Prior Function Level of Independence: Needs assistance   Gait / Transfers Assistance Needed: ambulating with RW  ADL's / Homemaking Assistance Needed: home  nurse assisting with some ADLs        Hand Dominance   Dominant Hand: Right    Extremity/Trunk Assessment   Upper Extremity Assessment Upper Extremity Assessment: Overall WFL for tasks assessed    Lower Extremity Assessment Lower Extremity Assessment: Generalized weakness       Communication   Communication: No difficulties  Cognition Arousal/Alertness: Awake/alert;Lethargic Behavior During Therapy: Flat  affect;Agitated Overall Cognitive Status: Within Functional Limits for tasks assessed                                 General Comments: Not formally assessed, appears to be upset.      General Comments General comments (skin integrity, edema, etc.): BP at end of session was 117/78, SpO2= 100% on RA. patient reports dizziness with supine to sit.  RN notified. Patient wants RW to be set very low despite PT's recommendations for appropriate height.     Exercises     Assessment/Plan    PT Assessment Patient needs continued PT services  PT Problem List Decreased strength;Decreased range of motion;Decreased activity tolerance;Decreased balance;Decreased mobility;Decreased knowledge of use of DME;Decreased safety awareness;Pain       PT Treatment Interventions DME instruction;Gait training;Stair training;Functional mobility training;Therapeutic activities;Balance training;Therapeutic exercise    PT Goals (Current goals can be found in the Care Plan section)  Acute Rehab PT Goals Patient Stated Goal: go to rehab before home PT Goal Formulation: With patient Time For Goal Achievement: 12/02/17 Potential to Achieve Goals: Good    Frequency Min 3X/week   Barriers to discharge        Co-evaluation               AM-PAC PT "6 Clicks" Daily Activity  Outcome Measure Difficulty turning over in bed (including adjusting bedclothes, sheets and blankets)?: A Lot Difficulty moving from lying on back to sitting on the side of the bed? : A Lot Difficulty sitting down on and standing up from a chair with arms (e.g., wheelchair, bedside commode, etc,.)?: A Lot Help needed moving to and from a bed to chair (including a wheelchair)?: A Little Help needed walking in hospital room?: A Little Help needed climbing 3-5 steps with a railing? : Total 6 Click Score: 13    End of Session Equipment Utilized During Treatment: Gait belt Activity Tolerance: Patient limited by  fatigue;Patient limited by pain Patient left: in chair;with call bell/phone within reach Nurse Communication: Mobility status PT Visit Diagnosis: Unsteadiness on feet (R26.81);Other abnormalities of gait and mobility (R26.89);Muscle weakness (generalized) (M62.81);Repeated falls (R29.6);History of falling (Z91.81);Pain Pain - Right/Left: Right Pain - part of body: Knee    Time: 1610-9604 PT Time Calculation (min) (ACUTE ONLY): 32 min   Charges:   PT Evaluation $PT Eval Low Complexity: 1 Low PT Treatments $Therapeutic Activity: 8-22 mins   PT G Codes:        Etta Grandchild, PT, DPT Acute Rehab Services Pager: [phone]    Etta Grandchild 11/25/2017, 4:33 PM

## 2017-11-25 NOTE — Progress Notes (Signed)
Patient ID: [name] [name], male   DOB: 1971-07-26, 47 y.o.   MRN: [id number] Patient is postoperative day 1 revision transtibial amputation.  The wound VAC has approximately 200 cc of drainage.  Anticipate patient will need discharge to skilled nursing.

## 2017-11-25 NOTE — Telephone Encounter (Signed)
Patient had surgery with Dr. Sharol Given on yesterday, 11/24/16 and is currently in the hospital.  Stated that he is bleeding and that no one is doing anything about it.  Advised that someone would be calling the nurse's station.

## 2017-11-25 NOTE — Progress Notes (Signed)
PT Cancellation Note  Patient Details Name: [name] [name] MRN: [id number] DOB: [birth year]   Cancelled Treatment:    Reason Eval/Treat Not Completed: Other (comment). Patient refusing PT again this AM. Patient expressing concern that his surgical site is bleeding too much, despite education and  nursing staff checking twice. Patient adamant that he requires MD re-assurance for peace of mind before mobilizing with therapy today. Notified RN of the issue. Will re-attempt later.   Reinaldo Berber, PT, DPT Acute Rehab Services Pager: [phone]    Reinaldo Berber 11/25/2017, 8:45 AM

## 2017-11-25 NOTE — Clinical Social Work Note (Signed)
Clinical Social Work Assessment  Patient Details  Name: [name] [name] MRN: [id number] Date of Birth: 07/12/1971  Date of referral:  11/25/17               Reason for consult:  Facility Placement                Permission sought to share information with:  Facility Art therapist granted to share information::  Yes, Verbal Permission Granted  Name::      SNF  Agency::     Relationship::     Contact Information:     Housing/Transportation Living arrangements for the past 2 months:  [place] of Information:  Patient Patient Interpreter Needed:  None Criminal Activity/Legal Involvement Pertinent to Current Situation/Hospitalization:  No - Comment as needed Significant Relationships:  Parents Lives with:  Self, Parents Do you feel safe going back to the place where you live?  No Need for family participation in patient care:  No (Coment)  Care giving concerns:  Pt fell at home after being at [facility] from 11/30-12/24 and BKA wound dehisced. Pt cannot be cared for at home with re-injured impairment and will need SNF placement again. Pt uninsured and resides with elderly parent. Pt has not worked since 2017 after an accident. Pt has applied for medicaid previousle and it is still pending per financial counselors.   CSW discussed case again with Zack and he approved Letter of Guarantee for 30 days again.  Social Worker assessment / plan:  CSW will f/u for LOG SNF placement, pending PT notes.  CSW will continue to follow up.  Employment status:  Unemployed Forensic scientist:  Self Pay (Medicaid Pending) PT Recommendations:  [place] / Referral to community resources:  [place]  Patient/Family's Response to care:  Patient appreciative of CSW meeting with him to discuss SNF placement again. CSW will f/u.  Patient/Family's Understanding of and Emotional Response to Diagnosis,  Current Treatment, and Prognosis:  Patient understands and acknowledges that he will need short term rehab again and desires to stay local and understands that without insurance, his options are limited. CSW advised that he may need to return to [place] if they will take LOG again. He will have wound vac and dressing changes that cannot be cared for at home safely. Financial counselors confirmed that pt has applied for medicaid and it is not active yet. No other issues or concerns identified at this time.  Emotional Assessment Appearance:  Appears stated age Attitude/Demeanor/Rapport:  (Cooperative) Affect (typically observed):  Accepting, Appropriate Orientation:  Oriented to Self, Oriented to Place, Oriented to  Time, Oriented to Situation Alcohol / Substance use:  Not Applicable Psych involvement (Current and /or in the community):  No (Comment)  Discharge Needs  Concerns to be addressed:  Discharge Planning Concerns Readmission within the last 30 days:  No Current discharge risk:  Dependent with Mobility, Physical Impairment Barriers to Discharge:  No Barriers Identified   Normajean Baxter, LCSW 11/25/2017, 10:36 AM

## 2017-11-25 NOTE — Progress Notes (Signed)
PROGRESS NOTE    [name] [name]  WJX:[id number] DOB: 05-27-1971 DOA: 11/23/2017 PCP: Julieanne Manson, MD   Outpatient Specialists:     Brief Narrative:   [name] [name] is a 47 y.o. 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 Lajoyce Corners; who presents today after having a fall 2 days ago with open wound of right BKA observed by his home health nurse while coming to change dressing yesterday 1/1 at 1300.  Patient actually notes that he has had multiple falls since being discharged from hospital.  went for stump revision on 1/2 and now has wound vac and will likely require SNF placement.      Assessment & Plan:   Principal Problem:   Wound dehiscence Active Problems:   Uncontrolled type 2 diabetes mellitus with hyperglycemia (HCC)   Falls   Benign essential HTN   Normocytic normochromic anemia   Wound dehiscence 2/2 Falls: -s/p repair: Necrotic muscle and hematoma extending to bone: Praveena wound VAC placed -will hold lovenox as output in wound vac is bloody with oozing under dressing  Diabetes mellitus type 2 with complications of peripheral neuropathy: Patient only on oral agents of metformin and glipizide.  Last hemoglobin A1c noted to be 11.1 on 10/11/2017. -SSI and resume home meds  Essential hypertension - Continue amlodipine  Normocytic normochromic anemia:  -Hgb lower than when he arrived [facility] after surgery -transfuse for <7    DVT prophylaxis:  Lovenox- held SCD   Code Status: Full Code   Family Communication:   Disposition Plan:     Consultants:   Lajoyce Corners   Subjective: Worried about stump that is bleeding  Objective: Vitals:   11/24/17 1315 11/24/17 1330 11/24/17 2200 11/25/17 0550  BP: (!) 134/101 (!) 136/98 124/79 131/82  Pulse: 90 93 (!) 101 99  Resp: 14 13 17 17   Temp:  97.9 F  (36.6 C) 98.9 F (37.2 C) 98.9 F (37.2 C)  TempSrc:   Oral Oral  SpO2: 100% 100% 100% 100%  Weight:      Height:        Intake/Output Summary (Last 24 hours) at 11/25/2017 1324 Last data filed at 11/25/2017 0700 Gross per 24 hour  Intake 580 ml  Output 1850 ml  Net -1270 ml   Filed Weights   11/24/17 0315  Weight: 98.6 kg (217 lb 6 oz)    Examination:  General exam: pale Respiratory system: clear Cardiovascular system: rrr Gastrointestinal system: +BS Central nervous system: Alert and oriented. No focal neurological deficits. Skin: right stump with vac and blood covered dressing medially Psychiatry: poor eye contact    Data Reviewed: I have personally reviewed following labs and imaging studies  CBC: Recent Labs  Lab 11/23/17 2332 11/25/17 0945  WBC 8.4 8.1  NEUTROABS 5.0  --   HGB 9.9* 8.4*  HCT 30.5* 26.4*  MCV 83.3 84.1  PLT 229 199   Basic Metabolic Panel: Recent Labs  Lab 11/23/17 2332 11/25/17 0945  NA 136 136  K 3.6 4.9  CL 104 104  CO2 23 27  GLUCOSE 80 231*  BUN 18 15  CREATININE 0.98 1.13  CALCIUM 8.7* 8.4*   GFR: Estimated Creatinine Clearance: 105.1 mL/min (by C-G formula based on SCr of 1.13 mg/dL). Liver Function Tests: No results for input(s): AST, ALT, ALKPHOS, BILITOT, PROT, ALBUMIN in the last 168 hours. No results for  input(s): LIPASE, AMYLASE in the last 168 hours. No results for input(s): AMMONIA in the last 168 hours. Coagulation Profile: Recent Labs  Lab 11/24/17 0536  INR 1.04   Cardiac Enzymes: No results for input(s): CKTOTAL, CKMB, CKMBINDEX, TROPONINI in the last 168 hours. BNP (last 3 results) No results for input(s): PROBNP in the last 8760 hours. HbA1C: No results for input(s): HGBA1C in the last 72 hours. CBG: Recent Labs  Lab 11/24/17 1256 11/24/17 1708 11/24/17 2205 11/25/17 0705 11/25/17 1140  GLUCAP 117* 172* 175* 156* 209*   Lipid Profile: No results for input(s): CHOL, HDL, LDLCALC, TRIG,  CHOLHDL, LDLDIRECT in the last 72 hours. Thyroid Function Tests: No results for input(s): TSH, T4TOTAL, FREET4, T3FREE, THYROIDAB in the last 72 hours. Anemia Panel: No results for input(s): VITAMINB12, FOLATE, FERRITIN, TIBC, IRON, RETICCTPCT in the last 72 hours. Urine analysis:    Component Value Date/Time   COLORURINE YELLOW 10/11/2017 2342   APPEARANCEUR CLEAR 10/11/2017 2342   LABSPEC 1.026 10/11/2017 2342   PHURINE 5.0 10/11/2017 2342   GLUCOSEU >=500 (A) 10/11/2017 2342   HGBUR MODERATE (A) 10/11/2017 2342   BILIRUBINUR NEGATIVE 10/11/2017 2342   KETONESUR 80 (A) 10/11/2017 2342   PROTEINUR 30 (A) 10/11/2017 2342   NITRITE NEGATIVE 10/11/2017 2342   LEUKOCYTESUR NEGATIVE 10/11/2017 2342   Sepsis Labs: @LABRCNTIP (procalcitonin:4,lacticidven:4)  )No results found for this or any previous visit (from the past 240 hour(s)).    Anti-infectives (From admission, onward)   Start     Dose/Rate Route Frequency Ordered Stop   11/24/17 1800  ceFAZolin (ANCEF) IVPB 1 g/50 mL premix     1 g 100 mL/hr over 30 Minutes Intravenous Every 6 hours 11/24/17 1354 11/25/17 0555   11/24/17 1030  ceFAZolin (ANCEF) IVPB 2g/100 mL premix     2 g 200 mL/hr over 30 Minutes Intravenous On call to O.R. 11/24/17 1026 11/24/17 1205   11/23/17 2215  ceFAZolin (ANCEF) IVPB 1 g/50 mL premix     1 g 100 mL/hr over 30 Minutes Intravenous  Once 11/23/17 2209 11/24/17 0018       Radiology Studies: No results found.      Scheduled Meds: . amLODipine  10 mg Oral Daily  . baclofen  10 mg Oral TID  . docusate sodium  100 mg Oral BID  . enoxaparin (LOVENOX) injection  40 mg Subcutaneous Q24H  . gabapentin  300 mg Oral TID  . insulin aspart  0-15 Units Subcutaneous TID WC  . insulin aspart  0-5 Units Subcutaneous QHS   Continuous Infusions: . sodium chloride    . lactated ringers 10 mL/hr at 11/24/17 1041  . methocarbamol (ROBAXIN)  IV       LOS: 1 day    Time spent: 35 min    Joseph Art, DO [facility] Pager [phone]  If 7PM-7AM, please contact night-coverage [email or web address] Password [email or web address] 11/25/2017, 1:24 PM

## 2017-11-25 NOTE — Progress Notes (Signed)
Pt currently speaking on telephone with Dr. Sharol Given.

## 2017-11-25 NOTE — Plan of Care (Signed)
  Education: Knowledge of General Education information will improve 11/25/2017 [phone] - Progressing by Anson Fret, RN Note POC and pain management reviewed with pt.

## 2017-11-25 NOTE — Progress Notes (Signed)
Myself and Alison Stalling have assessed patient's operative extremity, minimal serousanginous leakage from the top of the dressing noted, reinforced dressing per orders. Wound Vac still has good seal going continuous at 145mmHg and has good output in wound vac canister. Maudie Mercury Charge RN has paged Dr. Sharol Given to let him know of patients concern.

## 2017-11-25 NOTE — Telephone Encounter (Signed)
Pt is s/p a right BKA revision with prevena vac and pt states that the dressing is "bloody" and that "no one wants to do anything about it" do you want to put in orders for this pt for nursing to address and change if needed?

## 2017-11-25 NOTE — Social Work (Signed)
CSW met with patient and encouraged him to work with PT as we need the PT evaluation to send to SNF's to see if they will offer a LOG SNF placement.  Pt agreed to same and CSW will follow as clinicals will need to be sent out to SNF's once evaluated.  Elissa Hefty, LCSW Clinical Social Worker [phone]

## 2017-11-25 NOTE — Plan of Care (Signed)
  Progressing Education: Knowledge of General Education information will improve 11/25/2017 1249 - Progressing by Harlin Heys, RN Health Behavior/Discharge Planning: Ability to manage health-related needs will improve 11/25/2017 1249 - Progressing by Harlin Heys, RN Clinical Measurements: Ability to maintain clinical measurements within normal limits will improve 11/25/2017 1249 - Progressing by Harlin Heys, RN Will remain free from infection 11/25/2017 1249 - Progressing by Harlin Heys, RN Diagnostic test results will improve 11/25/2017 1249 - Progressing by Harlin Heys, RN Cardiovascular complication will be avoided 11/25/2017 1249 - Progressing by Harlin Heys, RN Activity: Risk for activity intolerance will decrease 11/25/2017 1249 - Progressing by Harlin Heys, RN Nutrition: Adequate nutrition will be maintained 11/25/2017 1249 - Progressing by Harlin Heys, RN Coping: Level of anxiety will decrease 11/25/2017 1249 - Progressing by Harlin Heys, RN Elimination: Will not experience complications related to bowel motility 11/25/2017 1249 - Progressing by Harlin Heys, RN Will not experience complications related to urinary retention 11/25/2017 1249 - Progressing by Harlin Heys, RN Safety: Ability to remain free from injury will improve 11/25/2017 1249 - Progressing by Harlin Heys, RN Skin Integrity: Risk for impaired skin integrity will decrease 11/25/2017 1249 - Progressing by Harlin Heys, RN

## 2017-11-26 LAB — GLUCOSE, CAPILLARY
Glucose-Capillary: 189 mg/dL — ABNORMAL HIGH (ref 65–99)
Glucose-Capillary: 185 mg/dL — ABNORMAL HIGH (ref 65–99)
Glucose-Capillary: 162 mg/dL — ABNORMAL HIGH (ref 65–99)
Glucose-Capillary: 220 mg/dL — ABNORMAL HIGH (ref 65–99)

## 2017-11-26 NOTE — Progress Notes (Signed)
PROGRESS NOTE    [name] [name]  MWU:[id number] DOB: 11/28/1970 DOA: 11/23/2017 PCP: Julieanne Manson, MD   Outpatient Specialists:     Brief Narrative:   [name] [name] is a 47 y.o. 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 Lajoyce Corners; who presents today after having a fall 2 days ago with open wound of right BKA observed by his home health nurse while coming to change dressing yesterday 1/1 at 1300.  Patient actually notes that he has had multiple falls since being discharged from hospital.  went for stump revision on 1/2 and now has wound vac and will likely require SNF placement.      Assessment & Plan:   Wound dehiscence/BKA due to Falls: -Orthopedics Dr. Lajoyce Corners consulting, he underwent revision of below-knee amputation and application of Praveena wound VAC on 1/2 -Noted to have necrotic muscle and hematoma extending to the bone -Today with excessive amounts of clots evacuated around the amputation site and wound VAC removed by Dr. Lajoyce Corners -Check stat CBC and type and screen will likely need couple units of blood, pending labs -holding lovenox -Orthopedics following, medically not stable for discharge to SNF today  Acute blood loss anemia -Patient is clinically symptomatic from massive amounts of blood loss following surgery -Wound VAC removed, dry dressing placed  -awaiting stat CBC, educated patient that he will need transfusion pending labs, he seems reluctant  Diabetes mellitus type 2 with complications of peripheral neuropathy:  -Patient only on oral agents of metformin and glipizide at home -Last hemoglobin A1c noted to be 11.1 on 10/11/2017. -SSI and resume home meds -CBGs and 180s today  Essential hypertension -  blood pressure low today due to excessive amounts of blood loss, stop amlodipine and monitor will likely  need transfusion  DVT prophylaxis: Lovenox- held, SCD  Code Status:Full Code Family Communication:No family at bedside Disposition Plan: SNF when stable   Procedure: Revision of below-knee amputation and application of Praveena wound VAC 1/2 1/4: Large blood clots evacuated at bedside and wound VAC removed  Consultants:   Lajoyce Corners   Subjective: -Had massive amounts of clots around his BKA site today, wound VAC removed by Dr. Lajoyce Corners and clots removed, dry dressing placed patient feels weak and dizzy, blood pressure is 92/60 now -  Objective: Vitals:   11/25/17 0550 11/25/17 1326 11/25/17 2032 11/26/17 0615  BP: 131/82 120/77 119/75 108/65  Pulse: 99 (!) 107 (!) 113 (!) 110  Resp: 17 18 19    Temp: 98.9 F (37.2 C) 99.6 F (37.6 C) 100.3 F (37.9 C) 99.2 F (37.3 C)  TempSrc: Oral Oral Oral Oral  SpO2: 100% 99% 99% 100%  Weight:      Height:        Intake/Output Summary (Last 24 hours) at 11/26/2017 1305 Last data filed at 11/26/2017 0930 Gross per 24 hour  Intake 240 ml  Output 775 ml  Net -535 ml   Filed Weights   11/24/17 0315  Weight: 98.6 kg (217 lb 6 oz)    Examination:  Gen: Drowsy somnolent, ill-appearing HEENT: PERRLA, Neck supple, no JVD Lungs: Good air movement bilaterally, CTAB CVS: RRR,No Gallops,Rubs or new Murmurs Abd: soft, Non tender, non distended, BS present Extremities: Right BKA with gauze dressing Skin: no new rashes Psychiatry: poor eye contact    Data Reviewed: I have personally reviewed following labs and imaging studies  CBC: Recent Labs  Lab 11/23/17 2332 11/25/17 0945  WBC 8.4 8.1  NEUTROABS 5.0  --   HGB 9.9* 8.4*  HCT 30.5* 26.4*  MCV 83.3 84.1  PLT 229 199   Basic Metabolic Panel: Recent Labs  Lab 11/23/17 2332 11/25/17 0945  NA 136 136  K 3.6 4.9  CL 104 104  CO2 23 27  GLUCOSE 80 231*  BUN 18 15  CREATININE 0.98 1.13  CALCIUM 8.7* 8.4*   GFR: Estimated Creatinine Clearance: 105.1 mL/min (by C-G formula  based on SCr of 1.13 mg/dL). Liver Function Tests: No results for input(s): AST, ALT, ALKPHOS, BILITOT, PROT, ALBUMIN in the last 168 hours. No results for input(s): LIPASE, AMYLASE in the last 168 hours. No results for input(s): AMMONIA in the last 168 hours. Coagulation Profile: Recent Labs  Lab 11/24/17 0536  INR 1.04   Cardiac Enzymes: No results for input(s): CKTOTAL, CKMB, CKMBINDEX, TROPONINI in the last 168 hours. BNP (last 3 results) No results for input(s): PROBNP in the last 8760 hours. HbA1C: No results for input(s): HGBA1C in the last 72 hours. CBG: Recent Labs  Lab 11/25/17 1140 11/25/17 1633 11/25/17 2111 11/26/17 0640 11/26/17 1129  GLUCAP 209* 294* 282* 189* 185*   Lipid Profile: No results for input(s): CHOL, HDL, LDLCALC, TRIG, CHOLHDL, LDLDIRECT in the last 72 hours. Thyroid Function Tests: No results for input(s): TSH, T4TOTAL, FREET4, T3FREE, THYROIDAB in the last 72 hours. Anemia Panel: No results for input(s): VITAMINB12, FOLATE, FERRITIN, TIBC, IRON, RETICCTPCT in the last 72 hours. Urine analysis:    Component Value Date/Time   COLORURINE YELLOW 10/11/2017 2342   APPEARANCEUR CLEAR 10/11/2017 2342   LABSPEC 1.026 10/11/2017 2342   PHURINE 5.0 10/11/2017 2342   GLUCOSEU >=500 (A) 10/11/2017 2342   HGBUR MODERATE (A) 10/11/2017 2342   BILIRUBINUR NEGATIVE 10/11/2017 2342   KETONESUR 80 (A) 10/11/2017 2342   PROTEINUR 30 (A) 10/11/2017 2342   NITRITE NEGATIVE 10/11/2017 2342   LEUKOCYTESUR NEGATIVE 10/11/2017 2342   Sepsis Labs: @LABRCNTIP (procalcitonin:4,lacticidven:4)  )No results found for this or any previous visit (from the past 240 hour(s)).    Anti-infectives (From admission, onward)   Start     Dose/Rate Route Frequency Ordered Stop   11/24/17 1800  ceFAZolin (ANCEF) IVPB 1 g/50 mL premix     1 g 100 mL/hr over 30 Minutes Intravenous Every 6 hours 11/24/17 1354 11/25/17 0555   11/24/17 1030  ceFAZolin (ANCEF) IVPB 2g/100 mL  premix     2 g 200 mL/hr over 30 Minutes Intravenous On call to O.R. 11/24/17 1026 11/24/17 1205   11/23/17 2215  ceFAZolin (ANCEF) IVPB 1 g/50 mL premix     1 g 100 mL/hr over 30 Minutes Intravenous  Once 11/23/17 2209 11/24/17 0018       Radiology Studies: No results found.      Scheduled Meds: . baclofen  10 mg Oral TID  . docusate sodium  100 mg Oral BID  . gabapentin  300 mg Oral TID  . glipiZIDE  2.5 mg Oral BID WC  . insulin aspart  0-15 Units Subcutaneous TID WC  . insulin aspart  0-5 Units Subcutaneous QHS  . metFORMIN  500 mg Oral BID WC   Continuous Infusions: . sodium chloride    . lactated ringers 10 mL/hr at 11/24/17 1041  . methocarbamol (ROBAXIN)  IV       LOS: 2 days    Time spent: 35 min    Zannie Cove, MD  [facility] Page via [email or web address] password [email or web address]  If 7PM-7AM, please contact night-coverage [email or web address] Password [email or web address] 11/26/2017, 1:05 PM

## 2017-11-26 NOTE — Plan of Care (Signed)
  Progressing Education: Knowledge of General Education information will improve 11/26/2017 1057 - Progressing by Rance Muir, RN Health Behavior/Discharge Planning: Ability to manage health-related needs will improve 11/26/2017 1057 - Progressing by Rance Muir, RN Clinical Measurements: Ability to maintain clinical measurements within normal limits will improve 11/26/2017 1057 - Progressing by Rance Muir, RN Will remain free from infection 11/26/2017 1057 - Progressing by Rance Muir, RN Diagnostic test results will improve 11/26/2017 1057 - Progressing by Rance Muir, RN Cardiovascular complication will be avoided 11/26/2017 1057 - Progressing by Rance Muir, RN Activity: Risk for activity intolerance will decrease 11/26/2017 1057 - Progressing by Rance Muir, RN Nutrition: Adequate nutrition will be maintained 11/26/2017 1057 - Progressing by Rance Muir, RN Coping: Level of anxiety will decrease 11/26/2017 1057 - Progressing by Rance Muir, RN Elimination: Will not experience complications related to bowel motility 11/26/2017 1057 - Progressing by Rance Muir, RN Will not experience complications related to urinary retention 11/26/2017 1057 - Progressing by Rance Muir, RN Safety: Ability to remain free from injury will improve 11/26/2017 1057 - Progressing by Rance Muir, RN Skin Integrity: Risk for impaired skin integrity will decrease 11/26/2017 1057 - Progressing by Rance Muir, RN

## 2017-11-26 NOTE — Progress Notes (Signed)
Patient ID: [name] [name], male   DOB: [birth year], 47 y.o.   MRN: [id number] Patient has had blood extravasate from under the wound VAC.  We will discontinue the wound VAC today remove the dressing and blood clot have a dry dressing applied.  Patient will need discharge to skilled nursing.

## 2017-11-26 NOTE — NC FL2 (Signed)
[place] MEDICAID FL2 LEVEL OF CARE SCREENING TOOL     IDENTIFICATION  Patient Name: [name] [name] Birthdate: [birth year] Sex: male Admission Date (Current Location): 11/23/2017  [facility] and [place] Number:  Producer, television/film/video and Address:  [facility], [address], [place], [place] [address]      Provider Number: [id number]  Attending Physician Name and Address:  Zannie Cove, MD  Relative Name and Phone Number:  Brayzen Wojnarowski, mom, [phone]    Current Level of Care: Hospital Recommended Level of Care: Skilled Nursing Facility Prior Approval Number:    Date Approved/Denied:   PASRR Number: [id number]  Discharge Plan: SNF    Current Diagnoses: Patient Active Problem List   Diagnosis Date Noted  . Wound dehiscence 11/24/2017  . Falls 11/24/2017  . Benign essential HTN 11/24/2017  . Normocytic normochromic anemia 11/24/2017  . Necrotizing fasciitis (HCC) 10/13/2017  . Sepsis (HCC) 10/11/2017  . Cellulitis of right foot 10/11/2017  . Uncontrolled type 2 diabetes mellitus with hyperglycemia (HCC) 10/11/2017  . ARF (acute renal failure) (HCC) 10/11/2017  . Nausea & vomiting 10/11/2017    Orientation RESPIRATION BLADDER Height & Weight     Time, Self, Situation, Place  Normal Continent Weight: 217 lb 6 oz (98.6 kg) Height:  6\' 5"  (195.6 cm)  BEHAVIORAL SYMPTOMS/MOOD NEUROLOGICAL BOWEL NUTRITION STATUS      Continent Diet(See DC Summary)  AMBULATORY STATUS COMMUNICATION OF NEEDS Skin   Limited Assist Verbally Wound Vac, Surgical wounds                       Personal Care Assistance Level of Assistance  Bathing, Feeding, Dressing Bathing Assistance: Limited assistance Feeding assistance: Independent Dressing Assistance: Limited assistance     Functional Limitations Info  Sight, Hearing, Speech Sight Info: Adequate Hearing Info: Adequate Speech Info: Adequate    SPECIAL CARE FACTORS FREQUENCY                       Contractures Contractures Info: Not present    Additional Factors Info  Code Status, Allergies, Insulin Sliding Scale Code Status Info: Full Allergies Info: ADHESIVE TAPE    Insulin Sliding Scale Info: Insulin Daily       Current Medications (11/26/2017):  This is the current hospital active medication list Current Facility-Administered Medications  Medication Dose Route Frequency Provider Last Rate Last Dose  . 0.9 %  sodium chloride infusion   Intravenous Continuous Nadara Mustard, MD      . acetaminophen (TYLENOL) tablet 650 mg  650 mg Oral Q4H PRN Nadara Mustard, MD       Or  . acetaminophen (TYLENOL) suppository 650 mg  650 mg Rectal Q4H PRN Nadara Mustard, MD      . amLODipine (NORVASC) tablet 10 mg  10 mg Oral Daily Madelyn Flavors A, MD   10 mg at 11/25/17 0920  . baclofen (LIORESAL) tablet 10 mg  10 mg Oral TID Madelyn Flavors A, MD   10 mg at 11/25/17 2156  . bisacodyl (DULCOLAX) suppository 10 mg  10 mg Rectal Daily PRN Nadara Mustard, MD      . docusate sodium (COLACE) capsule 100 mg  100 mg Oral BID Nadara Mustard, MD   100 mg at 11/25/17 2156  . gabapentin (NEURONTIN) capsule 300 mg  300 mg Oral TID Madelyn Flavors A, MD   300 mg at 11/25/17 2156  . glipiZIDE (GLUCOTROL)  tablet 2.5 mg  2.5 mg Oral BID WC Vann, Jessica U, DO      . HYDROmorphone (DILAUDID) injection 1 mg  1 mg Intravenous Q2H PRN Nadara Mustard, MD   1 mg at 11/25/17 2350  . insulin aspart (novoLOG) injection 0-15 Units  0-15 Units Subcutaneous TID WC Kirby-Graham, Beather Arbour, NP      . insulin aspart (novoLOG) injection 0-5 Units  0-5 Units Subcutaneous QHS Kirby-Graham, Beather Arbour, NP      . lactated ringers infusion   Intravenous Continuous Arta Bruce, MD 10 mL/hr at 11/24/17 1041    . magnesium citrate solution 1 Bottle  1 Bottle Oral Once PRN Nadara Mustard, MD      . metFORMIN (GLUCOPHAGE) tablet 500 mg  500 mg Oral BID WC Vann, Jessica U, DO   500 mg at 11/25/17 1658  . methocarbamol (ROBAXIN)  tablet 500 mg  500 mg Oral Q6H PRN Nadara Mustard, MD   500 mg at 11/25/17 2155   Or  . methocarbamol (ROBAXIN) 500 mg in dextrose 5 % 50 mL IVPB  500 mg Intravenous Q6H PRN Nadara Mustard, MD      . metoCLOPramide (REGLAN) tablet 5-10 mg  5-10 mg Oral Q8H PRN Nadara Mustard, MD       Or  . metoCLOPramide (REGLAN) injection 5-10 mg  5-10 mg Intravenous Q8H PRN Nadara Mustard, MD      . ondansetron [facility]) tablet 4 mg  4 mg Oral Q6H PRN Nadara Mustard, MD       Or  . ondansetron [facility]) injection 4 mg  4 mg Intravenous Q6H PRN Nadara Mustard, MD      . oxyCODONE (Oxy IR/ROXICODONE) immediate release tablet 10 mg  10 mg Oral Q3H PRN Nadara Mustard, MD   10 mg at 11/25/17 2156  . oxyCODONE (Oxy IR/ROXICODONE) immediate release tablet 5 mg  5 mg Oral Q3H PRN Nadara Mustard, MD      . polyethylene glycol (MIRALAX / GLYCOLAX) packet 17 g  17 g Oral Daily PRN Nadara Mustard, MD         Discharge Medications: Please see discharge summary for a list of discharge medications.  Relevant Imaging Results:  Relevant Lab Results:   Additional Information SS#: [id number]  Osie Amparo A Garth Diffley, LCSW

## 2017-11-26 NOTE — Progress Notes (Signed)
Physical Therapy Treatment Patient Details Name: [name] [name] MRN: [id number] DOB: 1971/03/29 Today's Date: 11/26/2017    History of Present Illness 47 y.o. male s/p R Revision of BKA after fall at home 12/30. 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    PT Comments    Patient tolerated ambulating ~84ft this session. Pt c/o dizziness and orthostatic. BP in sitting prior to ambulation 111/75 and in sitting after ambulating 92/60. RN notified. Current plan remains appropriate.    Follow Up Recommendations  SNF;Supervision/Assistance - 24 hour     Equipment Recommendations  None recommended by PT    Recommendations for Other Services       Precautions / Restrictions Precautions Precautions: Fall Precaution Comments: history of falls Restrictions Weight Bearing Restrictions: Yes RLE Weight Bearing: Non weight bearing    Mobility  Bed Mobility Overal bed mobility: Needs Assistance Bed Mobility: Supine to Sit     Supine to sit: Min assist     General bed mobility comments: assist to elevate trunk into sitting; cues for technique; increased time and effort  Transfers Overall transfer level: Needs assistance Equipment used: Rolling walker (2 wheeled) Transfers: Sit to/from [facility] Sit to Stand: Min assist         General transfer comment: assist to power up into standing and to gain balance upon standing; cues for safe hand placement  Ambulation/Gait Ambulation/Gait assistance: Min assist;+2 safety/equipment Ambulation Distance (Feet): 6 Feet Assistive device: Rolling walker (2 wheeled) Gait Pattern/deviations: Step-to pattern;Trunk flexed     General Gait Details: therapist recommended raising height of RW to improve gait and balance however pt refused; cues for sequencing and safe use of AD; pt c/o dizziness    Stairs            Wheelchair Mobility    Modified Rankin (Stroke Patients  Only)       Balance Overall balance assessment: History of Falls                                          Cognition Arousal/Alertness: Awake/alert Behavior During Therapy: Flat affect Overall Cognitive Status: Within Functional Limits for tasks assessed                                        Exercises      General Comments General comments (skin integrity, edema, etc.): BP in sitting EOB 111/75 and in sitting after ambulating short distance 92/60-RN notified      Pertinent Vitals/Pain Pain Assessment: 0-10 Pain Score: 8  Pain Location: R LE Pain Descriptors / Indicators: Aching;Grimacing;Guarding Pain Intervention(s): Limited activity within patient's tolerance;Monitored during session;Premedicated before session;Repositioned    Home Living                      Prior Function            PT Goals (current goals can now be found in the care plan section) Acute Rehab PT Goals Patient Stated Goal: go to rehab before home PT Goal Formulation: With patient Time For Goal Achievement: 12/02/17 Potential to Achieve Goals: Good Progress towards PT goals: Progressing toward goals    Frequency    Min 3X/week      PT Plan Current plan  remains appropriate    Co-evaluation              AM-PAC PT "6 Clicks" Daily Activity  Outcome Measure  Difficulty turning over in bed (including adjusting bedclothes, sheets and blankets)?: A Lot Difficulty moving from lying on back to sitting on the side of the bed? : Unable Difficulty sitting down on and standing up from a chair with arms (e.g., wheelchair, bedside commode, etc,.)?: Unable Help needed moving to and from a bed to chair (including a wheelchair)?: A Little Help needed walking in hospital room?: A Little Help needed climbing 3-5 steps with a railing? : Total 6 Click Score: 11    End of Session Equipment Utilized During Treatment: Gait belt Activity Tolerance: Patient  limited by fatigue;Other (comment)(c/o dizziness and orthostatic) Patient left: in chair;with call bell/phone within reach Nurse Communication: Mobility status;Other (comment)(orthostatic) PT Visit Diagnosis: Unsteadiness on feet (R26.81);Other abnormalities of gait and mobility (R26.89);Muscle weakness (generalized) (M62.81);Repeated falls (R29.6);History of falling (Z91.81);Pain Pain - Right/Left: Right Pain - part of body: Knee     Time: 0944-1000 PT Time Calculation (min) (ACUTE ONLY): 16 min  Charges:  $Gait Training: 8-22 mins                    G Codes:       Earney Navy, PTA Pager: [phone]     Darliss Cheney 11/26/2017, 10:59 AM

## 2017-11-27 DIAGNOSIS — D72829 Elevated white blood cell count, unspecified: Secondary | ICD-10-CM

## 2017-11-27 LAB — HEMOGLOBIN AND HEMATOCRIT, BLOOD
HCT: 22.4 % — ABNORMAL LOW (ref 39.0–52.0)
Hemoglobin: 7.2 g/dL — ABNORMAL LOW (ref 13.0–17.0)

## 2017-11-27 LAB — BASIC METABOLIC PANEL
Anion gap: 9 (ref 5–15)
BUN: 27 mg/dL — ABNORMAL HIGH (ref 6–20)
CO2: 28 mmol/L (ref 22–32)
Calcium: 8.1 mg/dL — ABNORMAL LOW (ref 8.9–10.3)
Chloride: 98 mmol/L — ABNORMAL LOW (ref 101–111)
Creatinine, Ser: 1.33 mg/dL — ABNORMAL HIGH (ref 0.61–1.24)
GFR calc Af Amer: >60 mL/min (ref >60)
GFR calc non Af Amer: >60 mL/min (ref >60)
Glucose, Bld: 171 mg/dL — ABNORMAL HIGH (ref 65–99)
Potassium: 4.2 mmol/L (ref 3.5–5.1)
Sodium: 135 mmol/L (ref 135–145)

## 2017-11-27 LAB — ABO/RH: ABO/RH(D): A NEG

## 2017-11-27 LAB — CBC
HCT: 18.8 % — ABNORMAL LOW (ref 39.0–52.0)
Hemoglobin: 6 g/dL — CL (ref 13.0–17.0)
MCH: 26.1 pg (ref 26.0–34.0)
MCHC: 31.9 g/dL (ref 30.0–36.0)
MCV: 81.7 fL (ref 78.0–100.0)
Platelets: 192 10*3/uL (ref 150–400)
RBC: 2.3 MIL/uL — ABNORMAL LOW (ref 4.22–5.81)
RDW: 14.4 % (ref 11.5–15.5)
WBC: 13.8 10*3/uL — ABNORMAL HIGH (ref 4.0–10.5)

## 2017-11-27 LAB — GLUCOSE, CAPILLARY
Glucose-Capillary: 179 mg/dL — ABNORMAL HIGH (ref 65–99)
Glucose-Capillary: 224 mg/dL — ABNORMAL HIGH (ref 65–99)
Glucose-Capillary: 154 mg/dL — ABNORMAL HIGH (ref 65–99)
Glucose-Capillary: 68 mg/dL (ref 65–99)

## 2017-11-27 LAB — PREPARE RBC (CROSSMATCH)

## 2017-11-27 MED ORDER — SODIUM CHLORIDE 0.9 % IV SOLN
Freq: Once | INTRAVENOUS | Status: AC
  Administered 2017-11-27: 11:00:00 via INTRAVENOUS

## 2017-11-27 MED ORDER — CEFAZOLIN SODIUM-DEXTROSE 1-4 GM/50ML-% IV SOLN
1.0000 g | Freq: Three times a day (TID) | INTRAVENOUS | Status: DC
  Filled 2017-11-27 (×3): qty 50

## 2017-11-27 MED ORDER — CEFAZOLIN SODIUM-DEXTROSE 1-4 GM/50ML-% IV SOLN
1.0000 g | Freq: Three times a day (TID) | INTRAVENOUS | Status: DC
  Filled 2017-11-27: qty 50

## 2017-11-27 MED ORDER — CEFAZOLIN SODIUM-DEXTROSE 1-4 GM/50ML-% IV SOLN
1.0000 g | Freq: Three times a day (TID) | INTRAVENOUS | Status: DC
  Administered 2017-11-27 – 2017-11-30 (×7): 1 g via INTRAVENOUS
  Filled 2017-11-27 (×9): qty 50

## 2017-11-27 MED ORDER — FUROSEMIDE 10 MG/ML IJ SOLN: 20.0000 mg | Freq: Once | INTRAMUSCULAR | Status: DC

## 2017-11-27 MED ORDER — ACETAMINOPHEN 325 MG PO TABS
650.0000 mg | ORAL_TABLET | Freq: Once | ORAL | Status: AC
  Administered 2017-11-27: 650 mg via ORAL
  Filled 2017-11-27: qty 2

## 2017-11-27 NOTE — Progress Notes (Signed)
Patient ID: [name] [name], male   DOB: 07/23/1971, 47 y.o.   MRN: [id number] Patient is status post revision right transtibial amputation.  The wound VAC has been discontinued the dressing is clean and dry.  Patient is resting comfortably.  Plan for discharge to skilled nursing.  Patient previously was in [place].

## 2017-11-27 NOTE — Progress Notes (Signed)
Pharmacy Antibiotic Note  [name] [name] is a 47 y.o. male admitted on 11/23/2017. Now with post-op fever. Pharmacy has been consulted for cefazolin dosing.  Plan: Start cefazolin 1g IV Q8h Monitor clinical picture, renal function F/U C&S, abx deescalation / LOT  Height: 6\' 5"  (195.6 cm) Weight: 217 lb 6 oz (98.6 kg) IBW/kg (Calculated) : 89.1  Temp (24hrs), Avg:99.6 F (37.6 C), Min:97.5 F (36.4 C), Max:100.7 F (38.2 C)  Recent Labs  Lab 11/23/17 2332 11/25/17 0945 11/27/17 0536  WBC 8.4 8.1 13.8*  CREATININE 0.98 1.13 1.33*    Estimated Creatinine Clearance: 89.3 mL/min (A) (by C-G formula based on SCr of 1.33 mg/dL (H)).    Allergies  Allergen Reactions  . Adhesive [Tape] Rash    Rash at site of tape--okay to use paper tape     Thank you for allowing pharmacy to be a part of this patient's care.  Reginia Naas 11/27/2017 10:02 AM

## 2017-11-27 NOTE — Progress Notes (Signed)
PROGRESS NOTE    [name] [name]  ZSW:[id number] DOB: 10-Mar-1971 DOA: 11/23/2017 PCP: Mack Hook, MD   Outpatient Specialists:     Brief Narrative:   [name] [name] is a 47 y.o. 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 Sharol Given; who presents today after having a fall 2 days ago with open wound of right BKA observed by his home health nurse while coming to change dressing yesterday 1/1 at 1300.  Patient actually notes that he has had multiple falls since being discharged from hospital.  went for stump revision on 1/2 had wound vac and will likely require SNF placement.  On 11/26/2017 patient had blood extravasated from under the wound VAC.  Therefore wound VAC removed, he had blood clot.  Now has dry dressing.   Assessment & Plan:   Wound dehiscence/BKA due to Falls: -Orthopedics Dr. Sharol Given consulting, he underwent revision of below-knee amputation and application of Praveena wound VAC on 1/2 -Noted to have necrotic muscle and hematoma extending to the bone -Yesterday with excessive amounts of clots evacuated around the amputation site and wound VAC removed by Dr. Sharol Given --holding lovenox -Labs today showed hemoglobin 6.  2 units PRBCs ordered. -Orthopedics following, medically not stable for discharge to SNF today  Acute blood loss anemia -Patient is clinically symptomatic from massive amounts of blood loss following surgery -Wound VAC removed, dry dressing placed  -Labs today show hemoglobin 6.  Patient agreeable to blood transfusion.  2 units PRBC ordered.  Low grade fever/Leukocytosis -He is noted to have low-grade fever with leukocytosis 13.8. -Also hypotensive and tachycardic. -Ordered blood cultures.  Will start empiric IV Ancef. -Repeat labs in a.m. -Monitor Blood pressure closely.  Diabetes mellitus type 2 with  complications of peripheral neuropathy:  -Patient only on oral agents of metformin and glipizide at home -Last hemoglobin A1c noted to be 11.1 on 10/11/2017. -SSI and resume home meds -Monitor CBGs  Essential hypertension -  blood pressure low today likely from anemia. Also having low grade fever. Amlodipine stopped. PRBC ordered.  DVT prophylaxis: Lovenox- held, SCD  Code Status:Full Code Family Communication:No family at bedside. Plan of care discussed with the patient Disposition Plan: SNF when stable   Procedure: Revision of below-knee amputation and application of Praveena wound VAC 1/2 1/4: Large blood clots evacuated at bedside and wound VAC removed  Consultants:   Dr. Sharol Given   Subjective: -Patient blood pressure noted to be low today.  He is also tachycardic.  Low-grade fever with leukocytosis 13.8.  He is complaining of some dizziness and nausea.   Objective: Vitals:   11/26/17 1332 11/26/17 2119 11/27/17 0300 11/27/17 0658  BP: 110/71 103/60  (!) 103/59  Pulse: (!) 121 (!) 118  (!) 111  Resp: 18 20    Temp: 100 F (37.8 C) (!) 100.7 F (38.2 C) (!) 97.5 F (36.4 C) 100 F (37.8 C)  TempSrc: Oral Oral Axillary Oral  SpO2: 99% 96%  98%  Weight:      Height:        Intake/Output Summary (Last 24 hours) at 11/27/2017 0958 Last data filed at 11/26/2017 1500 Gross per 24 hour  Intake 240 ml  Output -  Net 240 ml   Filed Weights   11/24/17 0315  Weight: 98.6 kg (217 lb 6 oz)    Examination:  Gen: awake, oriented, ill-appearing HEENT: PERRLA,  Neck supple, no JVD Lungs: Good air movement bilaterally, CTAB CVS: RRR,No Gallops,Rubs or new Murmurs Abd: soft, Non tender, non distended, BS present Extremities: Right BKA with gauze dressing Skin: no new rashes Psychiatry: poor eye contact   Data Reviewed: I have personally reviewed following labs and imaging studies  CBC: Recent Labs  Lab 11/23/17 2332 11/25/17 0945 11/27/17 0536  WBC 8.4 8.1 13.8*    NEUTROABS 5.0  --   --   HGB 9.9* 8.4* 6.0*  HCT 30.5* 26.4* 18.8*  MCV 83.3 84.1 81.7  PLT 229 199 053   Basic Metabolic Panel: Recent Labs  Lab 11/23/17 2332 11/25/17 0945 11/27/17 0536  NA 136 136 135  K 3.6 4.9 4.2  CL 104 104 98*  CO2 23 27 28   GLUCOSE 80 231* 171*  BUN 18 15 27*  CREATININE 0.98 1.13 1.33*  CALCIUM 8.7* 8.4* 8.1*   GFR: Estimated Creatinine Clearance: 89.3 mL/min (A) (by C-G formula based on SCr of 1.33 mg/dL (H)). Liver Function Tests: No results for input(s): AST, ALT, ALKPHOS, BILITOT, PROT, ALBUMIN in the last 168 hours. No results for input(s): LIPASE, AMYLASE in the last 168 hours. No results for input(s): AMMONIA in the last 168 hours. Coagulation Profile: Recent Labs  Lab 11/24/17 0536  INR 1.04   Cardiac Enzymes: No results for input(s): CKTOTAL, CKMB, CKMBINDEX, TROPONINI in the last 168 hours. BNP (last 3 results) No results for input(s): PROBNP in the last 8760 hours. HbA1C: No results for input(s): HGBA1C in the last 72 hours. CBG: Recent Labs  Lab 11/26/17 0640 11/26/17 1129 11/26/17 1647 11/26/17 2120 11/27/17 0644  GLUCAP 189* 185* 162* 220* 179*   Lipid Profile: No results for input(s): CHOL, HDL, LDLCALC, TRIG, CHOLHDL, LDLDIRECT in the last 72 hours. Thyroid Function Tests: No results for input(s): TSH, T4TOTAL, FREET4, T3FREE, THYROIDAB in the last 72 hours. Anemia Panel: No results for input(s): VITAMINB12, FOLATE, FERRITIN, TIBC, IRON, RETICCTPCT in the last 72 hours. Urine analysis:    Component Value Date/Time   COLORURINE YELLOW 10/11/2017 [place] 10/11/2017 2342   LABSPEC 1.026 10/11/2017 2342   PHURINE 5.0 10/11/2017 2342   GLUCOSEU >=500 (A) 10/11/2017 2342   HGBUR MODERATE (A) 10/11/2017 2342   BILIRUBINUR NEGATIVE 10/11/2017 2342   KETONESUR 80 (A) 10/11/2017 2342   PROTEINUR 30 (A) 10/11/2017 2342   NITRITE NEGATIVE 10/11/2017 2342   LEUKOCYTESUR NEGATIVE 10/11/2017 2342    Sepsis Labs: @LABRCNTIP (procalcitonin:4,lacticidven:4)  )No results found for this or any previous visit (from the past 240 hour(s)).    Anti-infectives (From admission, onward)   Start     Dose/Rate Route Frequency Ordered Stop   11/24/17 1800  ceFAZolin (ANCEF) IVPB 1 g/50 mL premix     1 g 100 mL/hr over 30 Minutes Intravenous Every 6 hours 11/24/17 1354 11/25/17 0555   11/24/17 1030  ceFAZolin (ANCEF) IVPB 2g/100 mL premix     2 g 200 mL/hr over 30 Minutes Intravenous On call to O.R. 11/24/17 1026 11/24/17 1205   11/23/17 2215  ceFAZolin (ANCEF) IVPB 1 g/50 mL premix     1 g 100 mL/hr over 30 Minutes Intravenous  Once 11/23/17 2209 11/24/17 0018       Radiology Studies: No results found.   Scheduled Meds: . acetaminophen  650 mg Oral Once  . baclofen  10 mg Oral TID  . docusate sodium  100 mg Oral BID  . gabapentin  300 mg Oral TID  . glipiZIDE  2.5 mg Oral BID WC  . insulin aspart  0-15 Units Subcutaneous TID WC  . insulin aspart  0-5 Units Subcutaneous QHS  . metFORMIN  500 mg Oral BID WC   Continuous Infusions: . sodium chloride    . sodium chloride    . lactated ringers 10 mL/hr at 11/24/17 1041  . methocarbamol (ROBAXIN)  IV       LOS: 3 days    Time spent: 35 min   Yaakov Guthrie, MD [facility] Page via [email or web address] password [email or web address]  If 7PM-7AM, please contact night-coverage [email or web address] Password [email or web address] 11/27/2017, 9:58 AM

## 2017-11-27 NOTE — Progress Notes (Signed)
Lab just called with a critical lab value hemoglobin 6.0.  Sent message to [facility] Team 14 through [place].  Will have day shift to follow up.

## 2017-11-28 ENCOUNTER — Inpatient Hospital Stay (HOSPITAL_COMMUNITY): Payer: Self-pay

## 2017-11-28 DIAGNOSIS — D62 Acute posthemorrhagic anemia: Secondary | ICD-10-CM

## 2017-11-28 DIAGNOSIS — D72829 Elevated white blood cell count, unspecified: Secondary | ICD-10-CM

## 2017-11-28 DIAGNOSIS — R509 Fever, unspecified: Secondary | ICD-10-CM

## 2017-11-28 DIAGNOSIS — D649 Anemia, unspecified: Secondary | ICD-10-CM

## 2017-11-28 DIAGNOSIS — I10 Essential (primary) hypertension: Secondary | ICD-10-CM

## 2017-11-28 LAB — HEMOGLOBIN AND HEMATOCRIT, BLOOD
HCT: 21.9 % — ABNORMAL LOW (ref 39.0–52.0)
HCT: 25.8 % — ABNORMAL LOW (ref 39.0–52.0)
Hemoglobin: 7.2 g/dL — ABNORMAL LOW (ref 13.0–17.0)
Hemoglobin: 8.2 g/dL — ABNORMAL LOW (ref 13.0–17.0)

## 2017-11-28 LAB — CBC
HCT: 21.4 % — ABNORMAL LOW (ref 39.0–52.0)
Hemoglobin: 6.8 g/dL — CL (ref 13.0–17.0)
MCH: 26.1 pg (ref 26.0–34.0)
MCHC: 31.8 g/dL (ref 30.0–36.0)
MCV: 82 fL (ref 78.0–100.0)
Platelets: 201 10*3/uL (ref 150–400)
RBC: 2.61 MIL/uL — ABNORMAL LOW (ref 4.22–5.81)
RDW: 14.8 % (ref 11.5–15.5)
WBC: 10.1 10*3/uL (ref 4.0–10.5)

## 2017-11-28 LAB — URINALYSIS, ROUTINE W REFLEX MICROSCOPIC
Bilirubin Urine: NEGATIVE
Glucose, UA: NEGATIVE mg/dL
Hgb urine dipstick: NEGATIVE
Ketones, ur: NEGATIVE mg/dL
Leukocytes, UA: NEGATIVE
Nitrite: NEGATIVE
Protein, ur: NEGATIVE mg/dL
Specific Gravity, Urine: 1.01 (ref 1.005–1.030)
pH: 5 (ref 5.0–8.0)

## 2017-11-28 LAB — BASIC METABOLIC PANEL
Anion gap: 6 (ref 5–15)
BUN: 22 mg/dL — ABNORMAL HIGH (ref 6–20)
CO2: 26 mmol/L (ref 22–32)
Calcium: 7.8 mg/dL — ABNORMAL LOW (ref 8.9–10.3)
Chloride: 100 mmol/L — ABNORMAL LOW (ref 101–111)
Creatinine, Ser: 1.15 mg/dL (ref 0.61–1.24)
GFR calc Af Amer: >60 mL/min (ref >60)
GFR calc non Af Amer: >60 mL/min (ref >60)
Glucose, Bld: 97 mg/dL (ref 65–99)
Potassium: 4.1 mmol/L (ref 3.5–5.1)
Sodium: 132 mmol/L — ABNORMAL LOW (ref 135–145)

## 2017-11-28 LAB — GLUCOSE, CAPILLARY
Glucose-Capillary: 94 mg/dL (ref 65–99)
Glucose-Capillary: 92 mg/dL (ref 65–99)
Glucose-Capillary: 163 mg/dL — ABNORMAL HIGH (ref 65–99)
Glucose-Capillary: 113 mg/dL — ABNORMAL HIGH (ref 65–99)
Glucose-Capillary: 145 mg/dL — ABNORMAL HIGH (ref 65–99)

## 2017-11-28 LAB — PREPARE RBC (CROSSMATCH)

## 2017-11-28 MED ORDER — SODIUM CHLORIDE 0.9 % IV SOLN
Freq: Once | INTRAVENOUS | Status: AC
  Administered 2017-11-28: 15:00:00 via INTRAVENOUS

## 2017-11-28 NOTE — Progress Notes (Signed)
PROGRESS NOTE    [name] [name]  PIR:[id number] DOB: 01/02/1971 DOA: 11/23/2017 PCP: Julieanne Manson, MD   Outpatient Specialists:     Brief Narrative:   [name] [name] is a 47 y.o. 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 Lajoyce Corners; who presents today after having a fall 2 days ago with open wound of right BKA observed by his home health nurse while coming to change dressing yesterday 1/1 at 1300.  Patient actually notes that he has had multiple falls since being discharged from hospital.  went for stump revision on 1/2 had wound vac and will likely require SNF placement.  On 11/26/2017 patient had blood extravasated from under the wound VAC.  Therefore wound VAC removed, he had blood clot.  Now has dry dressing.   Assessment & Plan:   Wound dehiscence/BKA due to Falls: -Orthopedics Dr. Lajoyce Corners consulting, he underwent revision of below-knee amputation and application of Praveena wound VAC on 1/2 -Noted to have necrotic muscle and hematoma extending to the bone -Yesterday with excessive amounts of clots evacuated around the amputation site and wound VAC removed by Dr. Lajoyce Corners --holding lovenox -Labs yesterday showed hemoglobin 6.  2 units PRBCs given.  Posttransfusion hemoglobin improved to 7.2. -However, labs this morning again hemoglobin 6.8.  He did not have any further bleeding from the amputation site.  Will reorder H/H.  If still low we will transfuse him today. -Orthopedics following, medically not stable for discharge to SNF today  Acute blood loss anemia -Patient is clinically symptomatic from massive amounts of blood loss following surgery -Wound VAC removed, dry dressing placed  -Labs yesterday hemoglobin 6.  Given 2 units PRBC. -Today hemoglobin again 6.8.  Will reorder H/H.  If still low will transfuse.  Low grade  fever/Leukocytosis -He is noted to have low-grade fever with leukocytosis 13.8. -Also hypotensive and tachycardic. -Ordered blood cultures.  Started empiric IV Ancef. -Repeat labs today showing hemoglobin 10.1.  Still having low-grade fevers.  Etiology? -Ordered chest x-ray, UA with reflux -Monitor Blood pressure closely.  Diabetes mellitus type 2 with complications of peripheral neuropathy:  -Patient only on oral agents of metformin and glipizide at home -Last hemoglobin A1c noted to be 11.1 on 10/11/2017. -SSI and resume home meds -Monitor CBGs  Essential hypertension -  blood pressure low likely from anemia. Also having low grade fever, tachycardia. Amlodipine stopped.   Tachycardia -Likely secondary to the anemia.   - Reluctant to give beta-blocker this may drop his blood pressure further. -Discussed with nursing staff.  We will place him on telemetry. -Hopefully tachycardia should improve once anemia improves.  DVT prophylaxis: Lovenox- held, SCD  Code Status:Full Code Family Communication:No family at bedside. Plan of care discussed with the patient Disposition Plan: SNF when stable   Procedure: Revision of below-knee amputation and application of Praveena wound VAC 1/2 1/4: Large blood clots evacuated at bedside and wound VAC removed  Consultants:   Dr. Lajoyce Corners   Subjective: -He still having low-grade fever, he is also tachycardic.  Hemoglobin 6.8 this morning.  Denies any complaints at this time.  Objective: Vitals:   11/27/17 1552 11/27/17 1616 11/27/17 1902 11/27/17 1952  BP: 128/75 132/75 126/70 127/73  Pulse: (!) 109 (!) 107 (!) 110 (!) 110  Resp: 19 18 18 16   Temp: 97.9 F (36.6 C) 99.3 F (37.4 C) 99.1 F (37.3 C)  99.6 F (37.6 C)  TempSrc: Oral  Oral Oral  SpO2: 98% 98% 98% 98%  Weight:      Height:        Intake/Output Summary (Last 24 hours) at 11/28/2017 0917 Last data filed at 11/28/2017 0304 Gross per 24 hour  Intake 2825.91 ml  Output -    Net 2825.91 ml   Filed Weights   11/24/17 0315  Weight: 98.6 kg (217 lb 6 oz)    Examination:  Gen: awake, oriented, ill-appearing HEENT: PERRLA, Neck supple, no JVD Lungs: Good air movement bilaterally, CTAB CVS: RRR,No Gallops,Rubs or new Murmurs Abd: soft, Non tender, non distended, BS present Extremities: Right BKA with gauze dressing, no bleeding Skin: no new rashes Psychiatry: poor eye contact   Data Reviewed: I have personally reviewed following labs and imaging studies  CBC: Recent Labs  Lab 11/23/17 2332 11/25/17 0945 11/27/17 0536 11/27/17 2027 11/28/17 0349  WBC 8.4 8.1 13.8*  --  10.1  NEUTROABS 5.0  --   --   --   --   HGB 9.9* 8.4* 6.0* 7.2* 6.8*  HCT 30.5* 26.4* 18.8* 22.4* 21.4*  MCV 83.3 84.1 81.7  --  82.0  PLT 229 199 192  --  201   Basic Metabolic Panel: Recent Labs  Lab 11/23/17 2332 11/25/17 0945 11/27/17 0536 11/28/17 0349  NA 136 136 135 132*  K 3.6 4.9 4.2 4.1  CL 104 104 98* 100*  CO2 23 27 28 26   GLUCOSE 80 231* 171* 97  BUN 18 15 27* 22*  CREATININE 0.98 1.13 1.33* 1.15  CALCIUM 8.7* 8.4* 8.1* 7.8*   GFR: Estimated Creatinine Clearance: 103.3 mL/min (by C-G formula based on SCr of 1.15 mg/dL). Liver Function Tests: No results for input(s): AST, ALT, ALKPHOS, BILITOT, PROT, ALBUMIN in the last 168 hours. No results for input(s): LIPASE, AMYLASE in the last 168 hours. No results for input(s): AMMONIA in the last 168 hours. Coagulation Profile: Recent Labs  Lab 11/24/17 0536  INR 1.04   Cardiac Enzymes: No results for input(s): CKTOTAL, CKMB, CKMBINDEX, TROPONINI in the last 168 hours. BNP (last 3 results) No results for input(s): PROBNP in the last 8760 hours. HbA1C: No results for input(s): HGBA1C in the last 72 hours. CBG: Recent Labs  Lab 11/27/17 1209 11/27/17 1646 11/27/17 2138 11/28/17 0241 11/28/17 0649  GLUCAP 224* 154* 68 94 92   Lipid Profile: No results for input(s): CHOL, HDL, LDLCALC, TRIG,  CHOLHDL, LDLDIRECT in the last 72 hours. Thyroid Function Tests: No results for input(s): TSH, T4TOTAL, FREET4, T3FREE, THYROIDAB in the last 72 hours. Anemia Panel: No results for input(s): VITAMINB12, FOLATE, FERRITIN, TIBC, IRON, RETICCTPCT in the last 72 hours. Urine analysis:    Component Value Date/Time   COLORURINE YELLOW 10/11/2017 2342   APPEARANCEUR CLEAR 10/11/2017 2342   LABSPEC 1.026 10/11/2017 2342   PHURINE 5.0 10/11/2017 2342   GLUCOSEU >=500 (A) 10/11/2017 2342   HGBUR MODERATE (A) 10/11/2017 2342   BILIRUBINUR NEGATIVE 10/11/2017 2342   KETONESUR 80 (A) 10/11/2017 2342   PROTEINUR 30 (A) 10/11/2017 2342   NITRITE NEGATIVE 10/11/2017 2342   LEUKOCYTESUR NEGATIVE 10/11/2017 2342   Sepsis Labs: @LABRCNTIP (procalcitonin:4,lacticidven:4)  ) Recent Results (from the past 240 hour(s))  Culture, blood (routine x 2)     Status: None (Preliminary result)   Collection Time: 11/27/17  8:27 PM  Result Value Ref Range Status   Specimen Description BLOOD LEFT HAND  Final   Special Requests IN PEDIATRIC BOTTLE  Blood Culture adequate volume  Final   Culture PENDING  Incomplete   Report Status PENDING  Incomplete      Anti-infectives (From admission, onward)   Start     Dose/Rate Route Frequency Ordered Stop   11/27/17 2100  ceFAZolin (ANCEF) IVPB 1 g/50 mL premix     1 g 100 mL/hr over 30 Minutes Intravenous Every 8 hours 11/27/17 1918     11/27/17 1700  ceFAZolin (ANCEF) IVPB 1 g/50 mL premix  Status:  Discontinued     1 g 100 mL/hr over 30 Minutes Intravenous Every 8 hours 11/27/17 1326 11/27/17 1918   11/27/17 1200  ceFAZolin (ANCEF) IVPB 1 g/50 mL premix  Status:  Discontinued     1 g 100 mL/hr over 30 Minutes Intravenous Every 8 hours 11/27/17 1004 11/27/17 1326   11/24/17 1800  ceFAZolin (ANCEF) IVPB 1 g/50 mL premix     1 g 100 mL/hr over 30 Minutes Intravenous Every 6 hours 11/24/17 1354 11/25/17 0555   11/24/17 1030  ceFAZolin (ANCEF) IVPB 2g/100 mL premix      2 g 200 mL/hr over 30 Minutes Intravenous On call to O.R. 11/24/17 1026 11/24/17 1205   11/23/17 2215  ceFAZolin (ANCEF) IVPB 1 g/50 mL premix     1 g 100 mL/hr over 30 Minutes Intravenous  Once 11/23/17 2209 11/24/17 0018       Radiology Studies: No results found.   Scheduled Meds: . baclofen  10 mg Oral TID  . docusate sodium  100 mg Oral BID  . gabapentin  300 mg Oral TID  . glipiZIDE  2.5 mg Oral BID WC  . insulin aspart  0-15 Units Subcutaneous TID WC  . insulin aspart  0-5 Units Subcutaneous QHS  . metFORMIN  500 mg Oral BID WC   Continuous Infusions: . sodium chloride    . sodium chloride    .  ceFAZolin (ANCEF) IV Stopped (11/28/17 0558)  . lactated ringers 10 mL/hr at 11/24/17 1041  . methocarbamol (ROBAXIN)  IV       LOS: 4 days    Time spent: >30 min   Vonzella Nipple, MD [facility] Page via [email or web address] password [email or web address]  If 7PM-7AM, please contact night-coverage [email or web address] Password [email or web address] 11/28/2017, 9:17 AM

## 2017-11-28 NOTE — Progress Notes (Signed)
CRITICAL VALUE ALERT  Critical Value:  Hemoglobin 6.8  Date & Time Notied:  11/28/2017 @ 0643  Provider Notified: [facility] [place]  Orders Received/Actions taken: Pending

## 2017-11-28 NOTE — Progress Notes (Signed)
Patient ID: [name] [name], male   DOB: [birth year], 47 y.o.   MRN: [id number] Patient is seen in follow-up for revision right transtibial amputations status post dehiscence from fall.  Patient is resting comfortably dressing is clean and dry plan for discharge to skilled nursing.

## 2017-11-28 NOTE — Progress Notes (Signed)
Pt's Hgb came back as 7.2 after being rechecked. RN informed Dr.  Barth Kirks who stated to transfuse one unit of PRBC and then recheck H&H.

## 2017-11-29 LAB — BASIC METABOLIC PANEL WITH GFR
Anion gap: 7 (ref 5–15)
BUN: 14 mg/dL (ref 6–20)
CO2: 28 mmol/L (ref 22–32)
Calcium: 8.4 mg/dL — ABNORMAL LOW (ref 8.9–10.3)
Chloride: 100 mmol/L — ABNORMAL LOW (ref 101–111)
Creatinine, Ser: 1.05 mg/dL (ref 0.61–1.24)
GFR calc Af Amer: >60 mL/min
GFR calc non Af Amer: >60 mL/min
Glucose, Bld: 182 mg/dL — ABNORMAL HIGH (ref 65–99)
Potassium: 3.8 mmol/L (ref 3.5–5.1)
Sodium: 135 mmol/L (ref 135–145)

## 2017-11-29 LAB — CBC
HCT: 26.6 % — ABNORMAL LOW (ref 39.0–52.0)
Hemoglobin: 8.4 g/dL — ABNORMAL LOW (ref 13.0–17.0)
MCH: 26.2 pg (ref 26.0–34.0)
MCHC: 31.6 g/dL (ref 30.0–36.0)
MCV: 82.9 fL (ref 78.0–100.0)
Platelets: 232 K/uL (ref 150–400)
RBC: 3.21 MIL/uL — ABNORMAL LOW (ref 4.22–5.81)
RDW: 14.7 % (ref 11.5–15.5)
WBC: 7.3 K/uL (ref 4.0–10.5)

## 2017-11-29 LAB — GLUCOSE, CAPILLARY
Glucose-Capillary: 103 mg/dL — ABNORMAL HIGH (ref 65–99)
Glucose-Capillary: 125 mg/dL — ABNORMAL HIGH (ref 65–99)
Glucose-Capillary: 159 mg/dL — ABNORMAL HIGH (ref 65–99)
Glucose-Capillary: 111 mg/dL — ABNORMAL HIGH (ref 65–99)

## 2017-11-29 NOTE — Progress Notes (Signed)
Physical Therapy Treatment Patient Details Name: [name] [name] MRN: [id number] DOB: 1971-01-13 Today's Date: 11/29/2017    History of Present Illness 47 y.o. male s/p R Revision of BKA after fall at home 12/30. 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    PT Comments    Patient tolerated increased gait distance of 89ft this session and required min/mod A +2 for safety with OOB mobility. Continue to progress as tolerated with anticipated d/c to SNF for further skilled PT services.     Follow Up Recommendations  SNF;Supervision/Assistance - 24 hour     Equipment Recommendations  None recommended by PT    Recommendations for Other Services       Precautions / Restrictions Precautions Precautions: Fall Precaution Comments: history of falls Restrictions Weight Bearing Restrictions: Yes RLE Weight Bearing: Non weight bearing    Mobility  Bed Mobility Overal bed mobility: Needs Assistance Bed Mobility: Supine to Sit     Supine to sit: Min assist     General bed mobility comments: assist to elevate trunk into sitting; HOB elevated and use of rail  Transfers Overall transfer level: Needs assistance Equipment used: Rolling walker (2 wheeled) Transfers: Sit to/from Stand Sit to Stand: Min assist;Mod assist         General transfer comment: min A to power up into standing from elevated surface and mod A to gain balance with posterior bias initially; cues for safe use of AD  Ambulation/Gait Ambulation/Gait assistance: Min assist;+2 safety/equipment;Mod assist Ambulation Distance (Feet): 15 Feet Assistive device: Rolling walker (2 wheeled) Gait Pattern/deviations: Step-to pattern;Trunk flexed     General Gait Details: assist for balance at times with LOB X3 especially with turning; cues for sequencing and proximity to RW; chair follow   Stairs            Wheelchair Mobility    Modified Rankin (Stroke Patients  Only)       Balance Overall balance assessment: History of Falls                                          Cognition Arousal/Alertness: Awake/alert Behavior During Therapy: Flat affect Overall Cognitive Status: No family/caregiver present to determine baseline cognitive functioning                                        Exercises      General Comments        Pertinent Vitals/Pain Pain Assessment: Faces Faces Pain Scale: Hurts little more Pain Location: R LE Pain Descriptors / Indicators: Aching;Grimacing;Guarding Pain Intervention(s): Limited activity within patient's tolerance;Monitored during session;Premedicated before session;Repositioned    Home Living                      Prior Function            PT Goals (current goals can now be found in the care plan section) Acute Rehab PT Goals Patient Stated Goal: go to rehab before home PT Goal Formulation: With patient Time For Goal Achievement: 12/02/17 Potential to Achieve Goals: Good Progress towards PT goals: Progressing toward goals    Frequency    Min 3X/week      PT Plan Current plan remains appropriate    Co-evaluation  AM-PAC PT "6 Clicks" Daily Activity  Outcome Measure  Difficulty turning over in bed (including adjusting bedclothes, sheets and blankets)?: A Lot Difficulty moving from lying on back to sitting on the side of the bed? : Unable Difficulty sitting down on and standing up from a chair with arms (e.g., wheelchair, bedside commode, etc,.)?: Unable Help needed moving to and from a bed to chair (including a wheelchair)?: A Little Help needed walking in hospital room?: A Little Help needed climbing 3-5 steps with a railing? : Total 6 Click Score: 11    End of Session Equipment Utilized During Treatment: Gait belt Activity Tolerance: Patient tolerated treatment well Patient left: in chair;with call bell/phone within  reach;with nursing/sitter in room Nurse Communication: Mobility status PT Visit Diagnosis: Unsteadiness on feet (R26.81);Other abnormalities of gait and mobility (R26.89);Muscle weakness (generalized) (M62.81);Repeated falls (R29.6);History of falling (Z91.81);Pain Pain - Right/Left: Right Pain - part of body: Knee     Time: 2774-1287 PT Time Calculation (min) (ACUTE ONLY): 18 min  Charges:  $Gait Training: 8-22 mins                    G Codes:       Earney Navy, PTA Pager: [phone]     Darliss Cheney 11/29/2017, 4:31 PM

## 2017-11-29 NOTE — Social Work (Signed)
CSW spoke with Suanne Marker in admissions at [facility] to see if she can offer SNF LOG bed placement.  CSW faxed clinicals for her to review.  CSW will f/u.  Elissa Hefty, LCSW Clinical Social Worker [phone]

## 2017-11-29 NOTE — Plan of Care (Signed)
  Progressing Education: Knowledge of General Education information will improve 11/29/2017 1209 - Progressing by Rance Muir, RN Health Behavior/Discharge Planning: Ability to manage health-related needs will improve 11/29/2017 1209 - Progressing by Rance Muir, RN Clinical Measurements: Ability to maintain clinical measurements within normal limits will improve 11/29/2017 1209 - Progressing by Rance Muir, RN Will remain free from infection 11/29/2017 1209 - Progressing by Rance Muir, RN Diagnostic test results will improve 11/29/2017 1209 - Progressing by Rance Muir, RN Cardiovascular complication will be avoided 11/29/2017 1209 - Progressing by Rance Muir, RN Activity: Risk for activity intolerance will decrease 11/29/2017 1209 - Progressing by Rance Muir, RN Nutrition: Adequate nutrition will be maintained 11/29/2017 1209 - Progressing by Rance Muir, RN Coping: Level of anxiety will decrease 11/29/2017 1209 - Progressing by Rance Muir, RN Elimination: Will not experience complications related to bowel motility 11/29/2017 1209 - Progressing by Rance Muir, RN Will not experience complications related to urinary retention 11/29/2017 1209 - Progressing by Rance Muir, RN Safety: Ability to remain free from injury will improve 11/29/2017 1209 - Progressing by Rance Muir, RN Skin Integrity: Risk for impaired skin integrity will decrease 11/29/2017 1209 - Progressing by Rance Muir, RN

## 2017-11-29 NOTE — Progress Notes (Signed)
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 Lajoyce Corners; whopresented after having a fall 2 days ago with open woundof right BKAobserved by his home healthnursewhile coming to change dressingyesterday 1/1at 1300.Patient actually notes that he has had multiple falls since being discharged from hospital. went for stump revision on 1/2 had wound vac. On 11/26/2017 patient had blood extravasated from under the wound VAC.  Therefore wound VAC removed, he had blood clot.  Now has dry dressing.    Assessment/Plan:  Wound dehiscence/BKA due to Falls: Orthopedics Dr. Lajoyce Corners consulting, he underwent revision of below-knee amputation and application of Praveena wound VAC on 1/2. Noted to have necrotic muscle and hematoma extending to the bone. excessive amounts of clots evacuated around the amputation site and wound VAC removed by Dr. Lajoyce Corners --holding lovenox. Improved. Orthopedics following, medically not stable for discharge to SNF today  Acute blood loss anemia. Patient is clinically symptomatic from massive amounts of blood loss following surgery -Wound VAC removed, dry dressing placed. Given 2 units PRBC. Then Hg is stable .  Low grade fever/Leukocytosis. He is noted to have low-grade fever with leukocytosis 13.8. Also hypotensive and tachycardic. -leukocytosis/fevers resolved. Blood cultures: pend.  Received empiric IV Ancef. Will transition to oral regimen in 24-48 hrs for mild possible wound infection    Diabetes mellitus type 2 with complications of peripheral neuropathy:Patient only on oral agents of metformin and glipizide at home -Last hemoglobin A1c noted to be 11.1 on 10/11/2017. -SSI and  resume home meds. Monitor CBGs  Essential hypertension.  blood pressure low likely from anemia. Amlodipine stopped.   Tachycardia. likely secondary to the anemia. resolved    Code Status: full Family Communication: d/w patient, RN (indicate person spoken with, relationship, and if by phone, the number) Disposition Plan: SNF, soon  Procedure: Revision of below-knee amputation and application of Praveena wound VAC 1/2 1/4: Large blood clots evacuated at bedside and wound VAC removed  Consultants:   Dr. Lajoyce Corners   Antibiotics: Anti-infectives (From admission, onward)   Start     Dose/Rate Route Frequency Ordered Stop   11/27/17 2100  ceFAZolin (ANCEF) IVPB 1 g/50 mL premix     1 g 100 mL/hr over 30 Minutes Intravenous Every 8 hours 11/27/17 1918     11/27/17 1700  ceFAZolin (ANCEF) IVPB 1 g/50 mL premix  Status:  Discontinued     1 g 100 mL/hr over 30 Minutes Intravenous Every 8 hours 11/27/17 1326 11/27/17 1918   11/27/17 1200  ceFAZolin (ANCEF) IVPB 1 g/50 mL premix  Status:  Discontinued     1 g 100 mL/hr over 30 Minutes Intravenous Every 8 hours 11/27/17 1004 11/27/17 1326   11/24/17 1800  ceFAZolin (ANCEF) IVPB 1 g/50 mL premix     1 g 100 mL/hr over 30 Minutes Intravenous Every 6 hours 11/24/17 1354 11/25/17 0555   11/24/17 1030  ceFAZolin (ANCEF) IVPB 2g/100 mL premix     2 g 200 mL/hr over 30 Minutes Intravenous On call to O.R. 11/24/17 1026 11/24/17 1205   11/23/17 2215  ceFAZolin (ANCEF) IVPB 1 g/50 mL premix     1 g 100 mL/hr over 30 Minutes Intravenous  Once 11/23/17 2209 11/24/17 0018        (indicate start date,  and stop date if known)  HPI/Subjective: Alert. Reports feeling better. No acute pains. No s/s of acute bleeding on exam.   Objective: Vitals:   11/28/17 1954 11/29/17 0401  BP: 121/75 129/81  Pulse: 95 90  Resp: 19 20  Temp: 99.8 F (37.7 C) 98 F (36.7 C)  SpO2: 99% 100%    Intake/Output Summary (Last 24 hours) at 11/29/2017 1053 Last  data filed at 11/29/2017 0900 Gross per 24 hour  Intake 1249.58 ml  Output 3150 ml  Net -1900.42 ml   Filed Weights   11/24/17 0315  Weight: 98.6 kg (217 lb 6 oz)    Exam:   General:  No distress   Cardiovascular: s1,s2 rrr  Respiratory: CTA BL  Abdomen: soft, nt, nd   Musculoskeletal: surgical wound is dry    Data Reviewed: Basic Metabolic Panel: Recent Labs  Lab 11/23/17 2332 11/25/17 0945 11/27/17 0536 11/28/17 0349 11/29/17 0915  NA 136 136 135 132* 135  K 3.6 4.9 4.2 4.1 3.8  CL 104 104 98* 100* 100*  CO2 23 27 28 26 28   GLUCOSE 80 231* 171* 97 182*  BUN 18 15 27* 22* 14  CREATININE 0.98 1.13 1.33* 1.15 1.05  CALCIUM 8.7* 8.4* 8.1* 7.8* 8.4*   Liver Function Tests: No results for input(s): AST, ALT, ALKPHOS, BILITOT, PROT, ALBUMIN in the last 168 hours. No results for input(s): LIPASE, AMYLASE in the last 168 hours. No results for input(s): AMMONIA in the last 168 hours. CBC: Recent Labs  Lab 11/23/17 2332 11/25/17 0945 11/27/17 0536 11/27/17 2027 11/28/17 0349 11/28/17 0933 11/28/17 1921 11/29/17 0915  WBC 8.4 8.1 13.8*  --  10.1  --   --  7.3  NEUTROABS 5.0  --   --   --   --   --   --   --   HGB 9.9* 8.4* 6.0* 7.2* 6.8* 7.2* 8.2* 8.4*  HCT 30.5* 26.4* 18.8* 22.4* 21.4* 21.9* 25.8* 26.6*  MCV 83.3 84.1 81.7  --  82.0  --   --  82.9  PLT 229 199 192  --  201  --   --  232   Cardiac Enzymes: No results for input(s): CKTOTAL, CKMB, CKMBINDEX, TROPONINI in the last 168 hours. BNP (last 3 results) No results for input(s): BNP in the last 8760 hours.  ProBNP (last 3 results) No results for input(s): PROBNP in the last 8760 hours.  CBG: Recent Labs  Lab 11/28/17 0649 11/28/17 1152 11/28/17 1630 11/28/17 2118 11/29/17 0647  GLUCAP 92 163* 113* 145* 103*    Recent Results (from the past 240 hour(s))  Culture, blood (routine x 2)     Status: None (Preliminary result)   Collection Time: 11/27/17  8:27 PM  Result Value Ref Range Status    Specimen Description BLOOD LEFT WRIST  Final   Special Requests IN PEDIATRIC BOTTLE Blood Culture adequate volume  Final   Culture NO GROWTH < 24 HOURS  Final   Report Status PENDING  Incomplete  Culture, blood (routine x 2)     Status: None (Preliminary result)   Collection Time: 11/27/17  8:27 PM  Result Value Ref Range Status   Specimen Description BLOOD LEFT HAND  Final   Special Requests IN PEDIATRIC BOTTLE Blood Culture adequate volume  Final   Culture PENDING  Incomplete   Report Status PENDING  Incomplete     Studies: Dg Chest Port 1 View  Result Date: 11/28/2017 CLINICAL DATA:  Fever for 2  days, history hypertension, type II diabetes mellitus, former smoker EXAM: PORTABLE CHEST 1 VIEW COMPARISON:  Portable exam 0942 hours compared to 10/20/2017 FINDINGS: Normal heart size, mediastinal contours, and pulmonary vascularity. Low inspiratory lung volumes with minimal elevation LEFT diaphragm Lungs grossly clear. No acute infiltrate, pleural effusion or pneumothorax. Bones unremarkable. IMPRESSION: Low lung volumes without definite acute infiltrate. Electronically Signed   By: Ulyses Southward M.D.   On: 11/28/2017 11:15    Scheduled Meds: . baclofen  10 mg Oral TID  . docusate sodium  100 mg Oral BID  . gabapentin  300 mg Oral TID  . glipiZIDE  2.5 mg Oral BID WC  . insulin aspart  0-15 Units Subcutaneous TID WC  . insulin aspart  0-5 Units Subcutaneous QHS  . metFORMIN  500 mg Oral BID WC   Continuous Infusions: . sodium chloride    .  ceFAZolin (ANCEF) IV Stopped (11/29/17 1610)  . lactated ringers 10 mL/hr at 11/24/17 1041  . methocarbamol (ROBAXIN)  IV      Principal Problem:   Wound dehiscence Active Problems:   Uncontrolled type 2 diabetes mellitus with hyperglycemia (HCC)   Falls   Benign essential HTN   Normocytic normochromic anemia   Leukocytosis   Fever   Acute posthemorrhagic anemia    Time spent: >35 minutes     Esperanza Sheets  [facility] Pager [phone]. If 7PM-7AM, please contact night-coverage at [email or web address], password [email or web address] 11/29/2017, 10:53 AM  LOS: 5 days

## 2017-11-29 NOTE — Social Work (Addendum)
CSW received call back from [facility] [place] at [facility] in [place] and they will take patient back for rehab.  CSW called [facility] and was advised that they will not be able to transport patient to SNF until Friday. CSW f/u with Assistant Clinical Director to assist with transport tomorrow morning.  CSW will f/u with doctor on dc summary to facility for 11/30/17.  CSW spoke with patient and advised of SNF bed at [place].  Patient set to transport to [facility], [place], [place] [address], [phone] on Tuesday Jan. 8th at 11:00 am by [facility], wheelchair [place], [phone].   Elissa Hefty, LCSW Clinical Social Worker [phone]

## 2017-11-30 LAB — GLUCOSE, CAPILLARY: Glucose-Capillary: 126 mg/dL — ABNORMAL HIGH (ref 65–99)

## 2017-11-30 MED ORDER — POLYETHYLENE GLYCOL 3350 17 G PO PACK: 17.0000 g | PACK | Freq: Every day | ORAL | 0 refills | Status: DC | PRN

## 2017-11-30 MED ORDER — DOXYCYCLINE HYCLATE 100 MG PO CAPS: 100.0000 mg | ORAL_CAPSULE | Freq: Two times a day (BID) | ORAL | 0 refills | Status: AC

## 2017-11-30 MED ORDER — DOCUSATE SODIUM 100 MG PO CAPS: 100.0000 mg | ORAL_CAPSULE | Freq: Two times a day (BID) | ORAL | 0 refills | Status: DC

## 2017-11-30 MED ORDER — BISACODYL 10 MG RE SUPP: 10.0000 mg | Freq: Every day | RECTAL | 0 refills | Status: DC | PRN

## 2017-11-30 MED ORDER — ONDANSETRON HCL 4 MG PO TABS: 4.0000 mg | ORAL_TABLET | Freq: Four times a day (QID) | ORAL | 0 refills | Status: DC | PRN

## 2017-11-30 MED ORDER — INSULIN ASPART 100 UNIT/ML ~~LOC~~ SOLN: 0.0000 [IU] | Freq: Three times a day (TID) | SUBCUTANEOUS | 11 refills | Status: DC

## 2017-11-30 NOTE — Clinical Social Work Placement (Signed)
CLINICAL SOCIAL WORK PLACEMENT  NOTE  Date:  11/30/2017  Patient Details  Name: [name] [name] MRN: [id number] Date of Birth: 1971/05/31  Clinical Social Work is seeking post-discharge placement for this patient at the Skilled  Nursing Facility level of care (*CSW will initial, date and re-position this form in  chart as items are completed):  Yes   Patient/family provided with [facility] Clinical Social Work Department's list of facilities offering this level of care within the geographic area requested by the patient (or if unable, by the patient's family).  Yes   Patient/family informed of their freedom to choose among providers that offer the needed level of care, that participate in Medicare, Medicaid or managed care program needed by the patient, have an available bed and are willing to accept the patient.  Yes   Patient/family informed of [facility]'s ownership interest in [facility] and [facility], as well as of the fact that they are under no obligation to receive care at these facilities.  PASRR submitted to EDS on       PASRR number received on       Existing PASRR number confirmed on 11/26/17     FL2 transmitted to all facilities in geographic area requested by pt/family on       FL2 transmitted to all facilities within larger geographic area on 11/26/17     Patient informed that his/her managed care company has contracts with or will negotiate with certain facilities, including the following:        Yes   Patient/family informed of bed offers received.  Patient chooses bed at [facility]     Physician recommends and patient chooses bed at      Patient to be transferred to [facility] on 11/30/17.  Patient to be transferred to facility by [facility]     Patient family notified on 11/30/17 of transfer.  Name of family member notified:  patient responsible for self     PHYSICIAN       Additional Comment:     _______________________________________________ Tresa Moore, LCSW 11/30/2017, 9:41 AM

## 2017-11-30 NOTE — Discharge Summary (Signed)
Physician Discharge Summary  [name] [name] MGQ:[id number] DOB: 29-Jan-1971 DOA: 11/23/2017  PCP: Mack Hook, MD  Admit date: 11/23/2017 Discharge date: 11/30/2017  Time spent: >35 minutes  Recommendations for Outpatient Follow-up:  F/u with MD at the SNF F/u with orthopedic surgery in 7-10 days   Discharge Diagnoses:  Principal Problem:   Wound dehiscence Active Problems:   Uncontrolled type 2 diabetes mellitus with hyperglycemia (HCC)   Falls   Benign essential HTN   Normocytic normochromic anemia   Leukocytosis   Fever   Acute posthemorrhagic anemia   Discharge Condition: stable   Diet recommendation: card modified. 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 Sharol Given; whopresented after having a fall 2 days ago with open woundof right BKAobserved by his home healthnursewhile coming to change dressingyesterday 1/1at 1300.Patient actually notes that he has had multiple falls since being discharged from hospital. went for stump revision on 1/2 had wound vac.On 11/26/2017 patient had blood extravasated from under the wound VAC. Therefore wound VAC removed, he had blood clot. Now has dry dressing. improving      Hospital Course:    Wound dehiscence/BKA due to Falls: Orthopedics Dr. Sharol Given consulting, he underwent revision of below-knee amputation and application of Praveena wound VAC on 1/2. Noted to have necrotic muscle and hematoma extending to the bone. excessive amounts of clots evacuated around the amputation site and wound VAC removed by Dr. Sharol Given - Improved. Orthopedics followed, medically not stable for discharge to SNF. Recommended to f/u with orthopedics in 7-10 days   Acute blood loss  anemia. Patient is clinically symptomatic from massive amounts of blood loss following surgery -Wound VAC removed, dry dressing placed. Given 2 unitsPRBC. Then Hg is stable .  Low grade fever/Leukocytosis. Resolved. Blood cultures: NGTD. No s/s of systemic infection.  Received empiric IV Ancef and  transitioned to oral regimen for mild wound infection    Diabetes mellitus type 2 with complications of peripheral neuropathy:Patient only on oral agents of metformin and glipizide at home. Cont ISS  Essential hypertension. blood pressure low likely from anemia. Amlodipine stopped.BP is stable   Tachycardia. likely secondary to the anemia.resolved      Procedures:  Wound close ure  (i.e. Studies not automatically included, echos, thoracentesis, etc; not x-rays)  Consultations:  Orthopedics   Discharge Exam: Vitals:   11/29/17 2127 11/30/17 0654  BP: 124/83 124/83  Pulse: 87 97  Resp: 20 20  Temp: 98.1 F (36.7 C) 97.7 F (36.5 C)  SpO2: 99% 97%    General: alert. No distress  Cardiovascular: s1,s2 rrr Respiratory: CTA BL  Discharge Instructions  Discharge Instructions    Diet - low sodium heart healthy   Complete by:  As directed    Discharge instructions   Complete by:  As directed    Please follow up with orthopedic surgery in 7-10 days   Increase activity slowly   Complete by:  As directed      Allergies as of 11/30/2017      Reactions   Adhesive [tape] Rash   Rash at site of tape--okay to use paper tape      Medication List    STOP taking these medications   amLODipine 10 MG tablet  Commonly known as:  NORVASC   methocarbamol 500 MG tablet Commonly known as:  ROBAXIN     TAKE these medications   AGAMATRIX PRESTO w/Device Kit Check sugars twice daily before meals   AGAMATRIX ULTRA-THIN LANCETS Misc Check blood sugars before meals twice daily   baclofen 10 MG tablet Commonly known as:  LIORESAL Take 10 mg by mouth 3 (three) times  daily.   bisacodyl 10 MG suppository Commonly known as:  DULCOLAX Place 1 suppository (10 mg total) rectally daily as needed for moderate constipation.   docusate sodium 100 MG capsule Commonly known as:  COLACE Take 1 capsule (100 mg total) by mouth 2 (two) times daily.   doxycycline 100 MG capsule Commonly known as:  VIBRAMYCIN Take 1 capsule (100 mg total) by mouth 2 (two) times daily for 5 days.   gabapentin 300 MG capsule Commonly known as:  NEURONTIN 1 cap by mouth in morning and midday, 2 caps by mouth at bedtime What changed:    how much to take  how to take this  when to take this  additional instructions   glipiZIDE 5 MG tablet Commonly known as:  GLUCOTROL 1/2 tab by mouth twice daily with meal What changed:    how much to take  how to take this  when to take this  additional instructions   glucose blood test strip Commonly known as:  AGAMATRIX PRESTO TEST Check blood sugars twice daily before meals   insulin aspart 100 UNIT/ML injection Commonly known as:  novoLOG Inject 0-5 Units into the skin 3 (three) times daily with meals. Insulin sliding scale   metFORMIN 500 MG tablet Commonly known as:  GLUCOPHAGE Take 1 tablet (500 mg total) by mouth 2 (two) times daily with a meal.   ondansetron 4 MG tablet Commonly known as:  ZOFRAN Take 1 tablet (4 mg total) by mouth every 6 (six) hours as needed for nausea.   Oxycodone HCl 10 MG Tabs Take 1 tablet (10 mg total) by mouth 2 (two) times daily. What changed:    when to take this  reasons to take this   polyethylene glycol packet Commonly known as:  MIRALAX / GLYCOLAX Take 17 g by mouth daily as needed for mild constipation.      Allergies  Allergen Reactions  . Adhesive [Tape] Rash    Rash at site of tape--okay to use paper tape   Follow-up Information    [name], MD In 1 week.   Specialty:  Orthopedic Surgery Contact information: [place]  [id number] [phone]            The results of significant diagnostics from this hospitalization (including imaging, microbiology, ancillary and laboratory) are listed below for reference.    Significant Diagnostic Studies: Dg Chest Port 1 View  Result Date: 11/28/2017 CLINICAL DATA:  Fever for 2 days, history hypertension, type II diabetes mellitus, former smoker EXAM: PORTABLE CHEST 1 VIEW COMPARISON:  Portable exam 0942 hours compared to 10/20/2017 FINDINGS: Normal heart size, mediastinal contours, and pulmonary vascularity. Low inspiratory lung volumes with minimal elevation LEFT diaphragm Lungs grossly clear. No acute infiltrate, pleural effusion or pneumothorax. Bones unremarkable. IMPRESSION: Low lung volumes without definite acute infiltrate. Electronically Signed   By: Lavonia Dana M.D.   On: 11/28/2017 11:15    Microbiology: Recent Results (from the past 240 hour(s))  Culture, blood (routine x 2)     Status: None (Preliminary result)   Collection Time: 11/27/17  8:27 PM  Result Value Ref Range Status   Specimen Description BLOOD LEFT WRIST  Final   Special Requests IN PEDIATRIC BOTTLE Blood Culture adequate volume  Final   Culture NO GROWTH 2 DAYS  Final   Report Status PENDING  Incomplete  Culture, blood (routine x 2)     Status: None (Preliminary result)   Collection Time: 11/27/17  8:27 PM  Result Value Ref Range Status   Specimen Description BLOOD LEFT HAND  Final   Special Requests IN PEDIATRIC BOTTLE Blood Culture adequate volume  Final   Culture NO GROWTH 1 DAY  Final   Report Status PENDING  Incomplete     Labs: Basic Metabolic Panel: Recent Labs  Lab 11/23/17 2332 11/25/17 0945 11/27/17 0536 11/28/17 0349 11/29/17 0915  NA 136 136 135 132* 135  K 3.6 4.9 4.2 4.1 3.8  CL 104 104 98* 100* 100*  CO2 23 27 28 26 28  GLUCOSE 80 231* 171* 97 182*  BUN 18 15 27* 22* 14  CREATININE 0.98 1.13 1.33* 1.15 1.05  CALCIUM 8.7* 8.4* 8.1* 7.8* 8.4*   Liver  Function Tests: No results for input(s): AST, ALT, ALKPHOS, BILITOT, PROT, ALBUMIN in the last 168 hours. No results for input(s): LIPASE, AMYLASE in the last 168 hours. No results for input(s): AMMONIA in the last 168 hours. CBC: Recent Labs  Lab 11/23/17 2332 11/25/17 0945 11/27/17 0536 11/27/17 2027 11/28/17 0349 11/28/17 0933 11/28/17 1921 11/29/17 0915  WBC 8.4 8.1 13.8*  --  10.1  --   --  7.3  NEUTROABS 5.0  --   --   --   --   --   --   --   HGB 9.9* 8.4* 6.0* 7.2* 6.8* 7.2* 8.2* 8.4*  HCT 30.5* 26.4* 18.8* 22.4* 21.4* 21.9* 25.8* 26.6*  MCV 83.3 84.1 81.7  --  82.0  --   --  82.9  PLT 229 199 192  --  201  --   --  232   Cardiac Enzymes: No results for input(s): CKTOTAL, CKMB, CKMBINDEX, TROPONINI in the last 168 hours. BNP: BNP (last 3 results) No results for input(s): BNP in the last 8760 hours.  ProBNP (last 3 results) No results for input(s): PROBNP in the last 8760 hours.  CBG: Recent Labs  Lab 11/29/17 0647 11/29/17 1202 11/29/17 1646 11/29/17 2136 11/30/17 0735  GLUCAP 103* 125* 159* 111* 126*       Signed:  Aren Pryde N  [facility] 11/30/2017, 8:58 AM    

## 2017-11-30 NOTE — Progress Notes (Signed)
Patient discharged with discharge instructions.

## 2017-11-30 NOTE — Progress Notes (Signed)
Patient complaining of nausea and vomiting. Zofran  Given as ordered

## 2017-11-30 NOTE — Social Work (Signed)
Clinical Social Worker facilitated patient discharge including contacting patient family and facility to confirm patient discharge plans.  Clinical information faxed to facility and family agreeable with plan.    CSW arranged ambulance transport via [place]  to [place].    RN to call [phone] to give report prior to discharge.  Clinical Social Worker will sign off for now as social work intervention is no longer needed. Please consult [place] again if new need arises.  Elissa Hefty, LCSW Clinical Social Worker [phone]

## 2017-11-30 NOTE — Progress Notes (Signed)
Patient for discharge to [facility], awaiting transporter, reported to nurse Arnetha Massy.

## 2017-12-01 LAB — TYPE AND SCREEN
ABO/RH(D): A NEG
Antibody Screen: NEGATIVE
Unit division: 0
Unit division: 0
Unit division: 0
Unit division: 0

## 2017-12-01 LAB — BPAM RBC
Blood Product Expiration Date: 201901152359
Blood Product Expiration Date: 201901162359
Blood Product Expiration Date: 201901182359
Blood Product Expiration Date: 201901182359
ISSUE DATE / TIME: 201901051115
ISSUE DATE / TIME: 201901051542
ISSUE DATE / TIME: 201901061407
Unit Type and Rh: 600
Unit Type and Rh: 600
Unit Type and Rh: 600
Unit Type and Rh: 600

## 2017-12-02 LAB — CULTURE, BLOOD (ROUTINE X 2)
Culture: NO GROWTH
Special Requests: ADEQUATE

## 2017-12-03 LAB — CULTURE, BLOOD (ROUTINE X 2)
Culture: NO GROWTH
Special Requests: ADEQUATE

## 2017-12-22 ENCOUNTER — Encounter (INDEPENDENT_AMBULATORY_CARE_PROVIDER_SITE_OTHER): Payer: Self-pay | Admitting: Family

## 2017-12-22 ENCOUNTER — Ambulatory Visit (INDEPENDENT_AMBULATORY_CARE_PROVIDER_SITE_OTHER): Payer: Self-pay | Admitting: Family

## 2017-12-22 ENCOUNTER — Ambulatory Visit: Payer: Self-pay | Admitting: Internal Medicine

## 2017-12-22 VITALS — Ht 77.0 in | Wt 217.0 lb

## 2017-12-22 DIAGNOSIS — Z89511 Acquired absence of right leg below knee: Secondary | ICD-10-CM

## 2017-12-22 HISTORY — DX: Acquired absence of right leg below knee: Z89.511

## 2017-12-22 NOTE — Progress Notes (Signed)
Post-Op Visit Note   Patient: [name] [name]           Date of Birth: 02/20/1971           MRN: [id number] Visit Date: 12/22/2017 PCP: Julieanne Manson, MD  Chief Complaint:  Chief Complaint  Patient presents with  . Right Leg - Routine Post Op    11/24/17 right BKA revision     HPI:  HPI The patient is a 47 year old gentleman seen today status post right below knee amputation on 11/24/17. Residing at rehab in [place]. States will be moving back to [place] on 12/30/17.  Ortho Exam Incision healing well. Minimal swelling. No erythema. No drainage. No sign of infection.  Visit Diagnoses:  1. History of right below knee amputation (HCC)     Plan: provided order for prosthesis to [facility]. Begin wearing shrinker once obtained. Sutures harvested today. Continue daily incisional care and dry dressings until healed.  Follow-Up Instructions: Return in about 2 weeks (around 01/05/2018).   Imaging: No results found.  Orders:  No orders of the defined types were placed in this encounter.  No orders of the defined types were placed in this encounter.    PMFS History: Patient Active Problem List   Diagnosis Date Noted  . History of right below knee amputation (HCC) 12/22/2017  . Fever   . Acute posthemorrhagic anemia   . Leukocytosis   . Falls 11/24/2017  . Benign essential HTN 11/24/2017  . Normocytic normochromic anemia 11/24/2017  . Sepsis (HCC) 10/11/2017  . Uncontrolled type 2 diabetes mellitus with hyperglycemia (HCC) 10/11/2017  . ARF (acute renal failure) (HCC) 10/11/2017  . Nausea & vomiting 10/11/2017   Past Medical History:  Diagnosis Date  . ARF (acute renal failure) (HCC) 09/2017  . Diabetes mellitus without complication (HCC)   . Hypertension   . Necrotizing fasciitis (HCC) 10/13/2017  . Wound dehiscence 11/24/2017    Family History  Problem Relation Age of Onset  . Diabetes Mellitus II Mother   . Depression Mother   . Heart disease Father    CABG x 4.  04/2017    Past Surgical History:  Procedure Laterality Date  . AMPUTATION Right 10/13/2017   Procedure: RIGHT BELOW KNEE AMPUTATION;  Surgeon: Nadara Mustard, MD;  Location[facility];  Service: Orthopedics;  Laterality: Right;  . AMPUTATION Right 11/24/2017   Procedure: AMPUTATION BELOW KNEE REVISION;  Surgeon: Nadara Mustard, MD;  Location: [facility] OR;  Service: Orthopedics;  Laterality: Right;  . NO PAST SURGERIES     Social History   Occupational History  . Occupation: unemployed  Tobacco Use  . Smoking status: Former Smoker    Packs/day: 0.25    Years: 35.00    Pack years: 8.75    Types: Cigarettes  . Smokeless tobacco: Never Used  . Tobacco comment: stopped, but back on 1 cigarette daily.  Substance and Sexual Activity  . Alcohol use: No    Frequency: Never  . Drug use: No  . Sexual activity: Not on file

## 2018-01-05 ENCOUNTER — Encounter (INDEPENDENT_AMBULATORY_CARE_PROVIDER_SITE_OTHER): Payer: Self-pay | Admitting: Orthopedic Surgery

## 2018-01-05 ENCOUNTER — Ambulatory Visit (INDEPENDENT_AMBULATORY_CARE_PROVIDER_SITE_OTHER): Payer: Medicaid Other | Admitting: Orthopedic Surgery

## 2018-01-05 VITALS — Ht 77.0 in | Wt 217.0 lb

## 2018-01-05 DIAGNOSIS — Z89511 Acquired absence of right leg below knee: Secondary | ICD-10-CM

## 2018-01-05 DIAGNOSIS — S5400XA Injury of ulnar nerve at forearm level, unspecified arm, initial encounter: Secondary | ICD-10-CM | POA: Diagnosis not present

## 2018-01-05 NOTE — Progress Notes (Signed)
Office Visit Note   Patient: [name] [name]           Date of Birth: [birth year]           MRN: [id number] Visit Date: 01/05/2018              Requested by: Mack Hook, MD [place], [place] [address] PCP: Mack Hook, MD  Chief Complaint  Patient presents with  . Right Leg - Routine Post Op      HPI: Patient is a 47 year old gentleman who presents for 2 separate issues.  He is status post right transtibial amputation currently wearing a compression sleeve.  Patient states he is noticed since surgery the atrophy along the ulnar border of his hand clawing of the second and ring finger and difficulty grasping and manipulating items with his hand.  He is currently undergoing therapy at skilled nursing to assist with his hand function.  Assessment & Plan: Visit Diagnoses:  1. History of right below knee amputation ([place])   2. Ulnar nerve damage, initial encounter     Plan: #1 continue with the stump shrinker on the right continue with knee extension exercises.  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  Discussed that I did have reviewed this case with Dr. Ernestina Patches and Dr. Marlou Sa.  Dr. Ernestina Patches does not feel that a nerve conduction study would be helpful he states that due to the advanced motor deficits that identifying the location of the ulnar nerve injury would be difficult.  Discussed options with ulnar nerve decompression and the consensus was that ulnar nerve decompression may not provide any additional improvement in function.  Recommended continue with his exercises continue with active and passive range of motion of his fingers.  Discussed the importance of not putting pressure on his elbow and keeping his elbow out straight.  Follow-Up Instructions: Return in about 4 weeks  (around 02/02/2018).   Ortho Exam  Patient is alert, oriented, no adenopathy, well-dressed, normal affect, normal respiratory effort. Examination patient's right transtibial amputation has healed well there is no drainage no cellulitis there is good consolidation of the residual limb he has full extension.  Examination of the left arm he has a negative Tinel sign at the elbow over the ulnar nerve.  He has active radial and median nerve function.  He can barely crosses fingers on the left and has significant atrophy of the hyper thenar eminence he has flexible clawing of the ring and little finger but he can actively extend them.  Patient has a positive Froment sign and cannot hold paper between thumb and index finger without flexing the thumb.  This appears to be a more chronic injury with the severe intrinsic wasting and hyperthenar wasting.  Imaging: No results found. No images are attached to the encounter.  Labs: Lab Results  Component Value Date   HGBA1C 11.1 (H) 10/11/2017   ESRSEDRATE 75 (H) 10/20/2017   CRP 1.9 (H) 10/20/2017   REPTSTATUS 12/02/2017 FINAL 11/27/2017   REPTSTATUS 12/03/2017 FINAL 11/27/2017   CULT NO GROWTH 5 DAYS 11/27/2017   CULT NO GROWTH 5 DAYS 11/27/2017    @LABSALLVALUES (HGBA1)@  Body mass index is 25.73 kg/m.  Orders:  No orders of the defined types were placed in this encounter.  No orders  of the defined types were placed in this encounter.    Procedures: No procedures performed  Clinical Data: No additional findings.  ROS:  All other systems negative, except as noted in the HPI. Review of Systems  Objective: Vital Signs: Ht 6\' 5"  (1.956 m)   Wt 217 lb (98.4 kg)   BMI 25.73 kg/m   Specialty Comments:  No specialty comments available.  PMFS History: Patient Active Problem List   Diagnosis Date Noted  . 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 History of right below knee amputation ([place]) 12/22/2017  . Fever   . Acute  posthemorrhagic anemia   . Leukocytosis   . Falls 11/24/2017  . Benign essential HTN 11/24/2017  . Normocytic normochromic anemia 11/24/2017  . Sepsis ([place]) 10/11/2017  . Uncontrolled type 2 diabetes mellitus with hyperglycemia ([place]) 10/11/2017  . ARF (acute renal failure) ([place]) 10/11/2017  . Nausea & vomiting 10/11/2017   Past Medical History:  Diagnosis Date  . ARF (acute renal failure) ([place]) 09/2017  . Diabetes mellitus without complication ([place])   . Hypertension   . Necrotizing fasciitis ([place]) 10/13/2017  . Wound dehiscence 11/24/2017    Family History  Problem Relation Age of Onset  . Diabetes Mellitus II Mother   . Depression Mother   . Heart disease Father        CABG x 4.  04/2017    Past Surgical History:  Procedure Laterality Date  . AMPUTATION Right 10/13/2017   Procedure: RIGHT BELOW KNEE AMPUTATION;  Surgeon: [name], MD;  Location: [place];  Service: Orthopedics;  Laterality: Right;  . AMPUTATION Right 11/24/2017   Procedure: AMPUTATION BELOW KNEE REVISION;  Surgeon: [name], MD;  Location: [place];  Service: Orthopedics;  Laterality: Right;  . NO PAST SURGERIES     Social History   Occupational History  . Occupation: unemployed  Tobacco Use  . Smoking status: Former Smoker    Packs/day: 0.25    Years: 35.00    Pack years: 8.75    Types: Cigarettes  . Smokeless tobacco: Never Used  . Tobacco comment: stopped, but back on 1 cigarette daily.  Substance and Sexual Activity  . Alcohol use: No    Frequency: Never  . Drug use: No  . Sexual activity: Not on file

## 2018-01-14 ENCOUNTER — Telehealth: Payer: Self-pay | Admitting: Internal Medicine

## 2018-01-14 NOTE — Telephone Encounter (Signed)
Patient called stating has four days not bowel movement and will like to have any suggestion of what to do.  Please advise.

## 2018-01-14 NOTE — Telephone Encounter (Signed)
Left message for patient to call back to office ?

## 2018-01-17 NOTE — Telephone Encounter (Signed)
Left message for patient to call the office

## 2018-01-18 ENCOUNTER — Ambulatory Visit: Payer: Self-pay | Admitting: Internal Medicine

## 2018-01-18 NOTE — Telephone Encounter (Signed)
Spoke with patient states he is only taking colace for constipation. States it is a little better will discuss further with Dr. Amil Amen at [facility] on 01/19/18 @ 5 pm.

## 2018-01-19 ENCOUNTER — Ambulatory Visit: Payer: Self-pay | Admitting: Internal Medicine

## 2018-01-21 ENCOUNTER — Ambulatory Visit: Payer: Self-pay | Admitting: Internal Medicine

## 2018-01-21 ENCOUNTER — Encounter: Payer: Self-pay | Admitting: Internal Medicine

## 2018-01-21 VITALS — BP 122/80 | HR 80 | Resp 12 | Ht 77.5 in | Wt 200.0 lb

## 2018-01-21 DIAGNOSIS — S5400XA Injury of ulnar nerve at forearm level, unspecified arm, initial encounter: Secondary | ICD-10-CM

## 2018-01-21 DIAGNOSIS — R6889 Other general symptoms and signs: Secondary | ICD-10-CM

## 2018-01-21 DIAGNOSIS — I10 Essential (primary) hypertension: Secondary | ICD-10-CM

## 2018-01-21 DIAGNOSIS — E1165 Type 2 diabetes mellitus with hyperglycemia: Secondary | ICD-10-CM

## 2018-01-21 DIAGNOSIS — E11621 Type 2 diabetes mellitus with foot ulcer: Secondary | ICD-10-CM

## 2018-01-21 DIAGNOSIS — D508 Other iron deficiency anemias: Secondary | ICD-10-CM

## 2018-01-21 DIAGNOSIS — N179 Acute kidney failure, unspecified: Secondary | ICD-10-CM

## 2018-01-21 DIAGNOSIS — L97528 Non-pressure chronic ulcer of other part of left foot with other specified severity: Secondary | ICD-10-CM

## 2018-01-21 DIAGNOSIS — F329 Major depressive disorder, single episode, unspecified: Secondary | ICD-10-CM

## 2018-01-21 LAB — GLUCOSE, POCT (MANUAL RESULT ENTRY): POC Glucose: 63 mg/dl — AB (ref 70–99)

## 2018-01-21 MED ORDER — METFORMIN HCL 1000 MG PO TABS: 1000.0000 mg | ORAL_TABLET | Freq: Two times a day (BID) | ORAL | 11 refills | Status: DC

## 2018-01-21 NOTE — Patient Instructions (Signed)
Please obtain Terbinafine Cream in the Foot Care section of [facility] as well as Tea Tree oil and Gold Bond Foot cream. Treat entire foot, including toenails and between toes with the Terbinafine twice daily for 14 days. Mix several drops of Tea Tree Oil with Gold Bond Foot cream and treat all of foot except in between the toes.  Dove soap for bathing--rinse thoroughly.  Then apply all over body:  Eucerin Cream for Eczema relief.  I would make two applications daily, but very important after bathing.

## 2018-01-21 NOTE — Progress Notes (Signed)
Subjective:    Patient ID: [name] [name], male    DOB: 11-Aug-1971, 47 y.o.   MRN: [id number]  HPI   1.  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  Apparently developed from IV sticks per patient.  2.  Right BKA:  To go to [facility] next week to start with prosthesis.  Following with Dr. Lajoyce Corners, Ortho.  Patient fell after his last visit and had dehiscence of BKA wound requiring further surgery and hemorrhage.  3.  Blood loss anemia.  Hemoglobin down to 6.8 on 11/28/2017.  Received 3 units prbcs per patient.  Discharge hemoglobin was 8.4 on 11/29/17.  He is uncertain if this was rechecked in SNF following discharge.  4.  DM:  Not eating in healthy way.  Had sausage, grits and eggs--lots of butter.  His mother cooks for him.  Eats a lot of pasta. Sugars generally running below 144 in the evening and generally below 100.  Sugars in the morning low to mid 100s, but occasionally above 150. He does not know if he received influenza or pneumococcal vaccines in the hospital--unable to see the documentation regarding this.  He states he believes he has received Pneumococcal vaccine at [facility] in [place]. He cannot afford eye exam cost of $69 He does check his left foot regularly, but not every night.  5.  Depressed regarding current situation.  Not suicidal.     Current Meds  Medication Sig  . AGAMATRIX ULTRA-THIN LANCETS MISC Check blood sugars before meals twice daily  . amLODipine (NORVASC) 10 MG tablet Take 10 mg by mouth daily.  . baclofen (LIORESAL) 10 MG tablet Take 10 mg by mouth. 1/2 tab twice a day  . Blood Glucose Monitoring Suppl (AGAMATRIX PRESTO) w/Device KIT Check sugars twice daily before meals  . docusate sodium (COLACE) 100 MG capsule Take 1 capsule (100 mg total) by mouth 2 (two) times daily.  Marland Kitchen gabapentin (NEURONTIN) 300 MG capsule 1 cap by mouth in morning and midday, 2 caps by mouth at bedtime  (Patient taking differently: Take 300 mg by mouth 2 (two) times daily. )  . glipiZIDE (GLUCOTROL) 5 MG tablet 1/2 tab by mouth twice daily with meal (Patient taking differently: Take 2.5 mg by mouth 2 (two) times daily with a meal. )  . glucose blood (AGAMATRIX PRESTO TEST) test strip Check blood sugars twice daily before meals  . metFORMIN (GLUCOPHAGE) 500 MG tablet Take 1 tablet (500 mg total) by mouth 2 (two) times daily with a meal.    Review of Systems     Objective:   Physical Exam  Appears depressed, pale Skin:  Diffusely dry with large flakes all over body, including scalp and face.   HEENT:  Palpebral conjunctivae quite pale. Chest:  CTA CV:  RRR without murmur or rub, radial pulses normal and equal.  Decreased left DP pulse.  Mild edema of left foot and lower leg. Left LE:  Left foot with 1 cm ulcer surrounded by callus at plantar aspect of 1st MTP.  Extreme dryness of LE and foot with large hard dry flaking.  Mild inflammation of toes with flaking and thick, discolored nails.     Assessment & Plan:  1.  DM:  Increase Metformin to 1000 mg twice daily and keep Glipizide at 2.5 mg twice daily.  Appears to have significantly improved control, but needs to work on diet.    2.  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  To apply Terbinafine to entire foot and interdigital areas twice daily for 14 days.  Mix of Tea Tree Oil and Gold Bond Foot cream and apply twice daily to foot, but not interdigital areas as well.  Needs to elevate foot.  3.  Dry skin/poor skin health:  Dove soap.  Eucerin Cream with Eczema relief  4.  Depression:  Samul Dada, LCSW to contact for counseling.  He will contemplate medication.  Would recommend starting with Bupropion.  5.  LE edema:  No recliner in home.  Sits up all day in wheelchair as afraid to transfer.  Concerned with safety issues not addressed in his home.  He is bathing out of a bucket, does not have safety rails or a shower  seat to utilize in a shower, which is likely part of the difficulty with his skin hygiene.  Will look into whether any of this care is available to him through [facility].   Have encouraged him to get back with his contact for disability so he can obtain some of these basic supports.

## 2018-01-22 LAB — CBC WITH DIFFERENTIAL/PLATELET
Basophils Absolute: 0 10*3/uL (ref 0.0–0.2)
Basos: 0 %
EOS (ABSOLUTE): 0.6 10*3/uL — ABNORMAL HIGH (ref 0.0–0.4)
Eos: 5 %
Hematocrit: 39.1 % (ref 37.5–51.0)
Hemoglobin: 12.8 g/dL — ABNORMAL LOW (ref 13.0–17.7)
Immature Grans (Abs): 0 10*3/uL (ref 0.0–0.1)
Immature Granulocytes: 0 %
Lymphocytes Absolute: 2.7 10*3/uL (ref 0.7–3.1)
Lymphs: 26 %
MCH: 27.1 pg (ref 26.6–33.0)
MCHC: 32.7 g/dL (ref 31.5–35.7)
MCV: 83 fL (ref 79–97)
Monocytes Absolute: 0.5 10*3/uL (ref 0.1–0.9)
Monocytes: 4 %
Neutrophils Absolute: 6.7 10*3/uL (ref 1.4–7.0)
Neutrophils: 65 %
Platelets: 228 10*3/uL (ref 150–379)
RBC: 4.72 x10E6/uL (ref 4.14–5.80)
RDW: 15.2 % (ref 12.3–15.4)
WBC: 10.4 10*3/uL (ref 3.4–10.8)

## 2018-01-22 LAB — BASIC METABOLIC PANEL
BUN/Creatinine Ratio: 18 (ref 9–20)
BUN: 17 mg/dL (ref 6–24)
CO2: 22 mmol/L (ref 20–29)
Calcium: 9.4 mg/dL (ref 8.7–10.2)
Chloride: 103 mmol/L (ref 96–106)
Creatinine, Ser: 0.92 mg/dL (ref 0.76–1.27)
GFR calc Af Amer: 116 mL/min/{1.73_m2} (ref >59)
GFR calc non Af Amer: 100 mL/min/{1.73_m2} (ref >59)
Glucose: 101 mg/dL — ABNORMAL HIGH (ref 65–99)
Potassium: 4.2 mmol/L (ref 3.5–5.2)
Sodium: 142 mmol/L (ref 134–144)

## 2018-01-22 LAB — HGB A1C W/O EAG: Hgb A1c MFr Bld: 5.7 % — ABNORMAL HIGH (ref 4.8–5.6)

## 2018-01-22 LAB — TSH: TSH: 3.37 u[IU]/mL (ref 0.450–4.500)

## 2018-01-26 ENCOUNTER — Telehealth: Payer: Self-pay | Admitting: Internal Medicine

## 2018-01-26 NOTE — Telephone Encounter (Signed)
Patient needs refill of baclofen (LIORESAL) 10 mg tablet [facility].  Needs at $4.00 price or [facility] on [address], which ever is $4.00.

## 2018-01-26 NOTE — Telephone Encounter (Signed)
To Dr. Mulberry for further direction 

## 2018-01-27 MED ORDER — BACLOFEN 10 MG PO TABS: ORAL_TABLET | ORAL | 6 refills | Status: DC

## 2018-01-27 NOTE — Telephone Encounter (Signed)
Left message for patient Rx sent to public health department

## 2018-01-28 ENCOUNTER — Other Ambulatory Visit: Payer: Self-pay

## 2018-01-28 MED ORDER — BACLOFEN 10 MG PO TABS: ORAL_TABLET | ORAL | 6 refills | Status: DC

## 2018-02-01 ENCOUNTER — Telehealth: Payer: Self-pay | Admitting: Internal Medicine

## 2018-02-01 NOTE — Telephone Encounter (Signed)
Patient needs refill on his test strips.

## 2018-02-02 ENCOUNTER — Ambulatory Visit (INDEPENDENT_AMBULATORY_CARE_PROVIDER_SITE_OTHER): Payer: Medicaid Other | Admitting: Orthopedic Surgery

## 2018-02-02 ENCOUNTER — Other Ambulatory Visit: Payer: Self-pay

## 2018-02-02 ENCOUNTER — Telehealth (INDEPENDENT_AMBULATORY_CARE_PROVIDER_SITE_OTHER): Payer: Self-pay

## 2018-02-02 ENCOUNTER — Encounter (INDEPENDENT_AMBULATORY_CARE_PROVIDER_SITE_OTHER): Payer: Self-pay | Admitting: Orthopedic Surgery

## 2018-02-02 VITALS — Ht 77.0 in | Wt 200.0 lb

## 2018-02-02 DIAGNOSIS — Z89511 Acquired absence of right leg below knee: Secondary | ICD-10-CM

## 2018-02-02 DIAGNOSIS — L97521 Non-pressure chronic ulcer of other part of left foot limited to breakdown of skin: Secondary | ICD-10-CM

## 2018-02-02 HISTORY — DX: Non-pressure chronic ulcer of other part of left foot limited to breakdown of skin: L97.521

## 2018-02-02 MED ORDER — GLUCOSE BLOOD VI STRP: ORAL_STRIP | 12 refills | Status: DC

## 2018-02-02 NOTE — Telephone Encounter (Signed)
Rx sent to pharmacy   

## 2018-02-02 NOTE — Progress Notes (Signed)
Office Visit Note   Patient: [name] [name]           Date of Birth: October 04, 1971           MRN: [id number] Visit Date: 02/02/2018              Requested by: Julieanne Manson, MD [address] [place], [place] [address] PCP: Julieanne Manson, MD  Chief Complaint  Patient presents with  . Right Leg - Routine Post Op    11/24/17 right revision BKA  . Left Foot - Open Wound      HPI: Patient presents for follow-up for right transtibial amputation revision.  Patient also has a Waggoner grade 1 ulcer beneath the first metatarsal head of the left foot.  Patient is working with Technical sales engineer for his prosthetic.  Assessment & Plan: Visit Diagnoses:  1. History of right below knee amputation (HCC)   2. Non-pressure chronic ulcer of other part of left foot limited to breakdown of skin (HCC)     Plan: Continue with the stump shrinker patient has a double extra-large stump shrinker.  A felt donut was placed underneath his orthotic of his left shoe to unload pressure beneath the first metatarsal head ulcer.  Recommended a Band-Aid and antibiotic ointment.  A prescription was provided for [employer] for custom orthotic for his left shoe.  They will be fabricating his prosthetic on the right.  Follow-Up Instructions: Return in about 4 weeks (around 03/02/2018).   Ortho Exam  Patient is alert, oriented, no adenopathy, well-dressed, normal affect, normal respiratory effort. Examination the incision is well-healed there is no redness no cellulitis there is a little bit of swelling of the right transtibial amputation.  Examination left foot patient has a Waggoner grade 1 ulcer beneath the first metatarsal head.  After informed consent a 10 blade knife was used to debride the skin and soft tissue back to healthy viable granulation tissue this was touched with silver nitrate the ulcer is 10 mm in diameter 1 mm deep.  Imaging: No results found. No images are attached to the encounter.  Labs: Lab  Results  Component Value Date   HGBA1C 5.7 (H) 01/21/2018   HGBA1C 11.1 (H) 10/11/2017   ESRSEDRATE 75 (H) 10/20/2017   CRP 1.9 (H) 10/20/2017   REPTSTATUS 12/02/2017 FINAL 11/27/2017   REPTSTATUS 12/03/2017 FINAL 11/27/2017   CULT NO GROWTH 5 DAYS 11/27/2017   CULT NO GROWTH 5 DAYS 11/27/2017    @LABSALLVALUES (HGBA1)@  Body mass index is 23.72 kg/m.  Orders:  No orders of the defined types were placed in this encounter.  No orders of the defined types were placed in this encounter.    Procedures: No procedures performed  Clinical Data: No additional findings.  ROS:  All other systems negative, except as noted in the HPI. Review of Systems  Objective: Vital Signs: Ht 6\' 5"  (1.956 m)   Wt 200 lb (90.7 kg)   BMI 23.72 kg/m   Specialty Comments:  No specialty comments available.  PMFS History: Patient Active Problem List   Diagnosis Date Noted  . Non-pressure chronic ulcer of other part of left foot limited to breakdown of skin (HCC) 02/02/2018  . 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 History of right below knee amputation (HCC) 12/22/2017  . Fever   . Acute posthemorrhagic anemia   . Leukocytosis   . Falls 11/24/2017  . Benign essential HTN 11/24/2017  . Normocytic normochromic anemia 11/24/2017  . Sepsis (HCC) 10/11/2017  .  Uncontrolled type 2 diabetes mellitus with hyperglycemia (HCC) 10/11/2017  . ARF (acute renal failure) (HCC) 10/11/2017  . Nausea & vomiting 10/11/2017   Past Medical History:  Diagnosis Date  . ARF (acute renal failure) (HCC) 09/2017  . Diabetes mellitus without complication (HCC)   . Hypertension   . Necrotizing fasciitis (HCC) 10/13/2017  . Wound dehiscence 11/24/2017    Family History  Problem Relation Age of Onset  . Diabetes Mellitus II Mother   . Depression Mother   . Heart disease Father        CABG x 4.  04/2017    Past Surgical History:  Procedure Laterality Date  . AMPUTATION Right 10/13/2017    Procedure: RIGHT BELOW KNEE AMPUTATION;  Surgeon: Nadara Mustard, MD;  Location: [facility] OR;  Service: Orthopedics;  Laterality: Right;  . AMPUTATION Right 11/24/2017   Procedure: AMPUTATION BELOW KNEE REVISION;  Surgeon: Nadara Mustard, MD;  Location: [facility] OR;  Service: Orthopedics;  Laterality: Right;  . NO PAST SURGERIES     Social History   Occupational History  . Occupation: unemployed  Tobacco Use  . Smoking status: Current Every Day Smoker    Packs/day: 0.25    Years: 35.00    Pack years: 8.75    Types: Cigarettes  . Smokeless tobacco: Never Used  . Tobacco comment: stopped, but back on 1 cigarette daily.  Substance and Sexual Activity  . Alcohol use: No    Frequency: Never  . Drug use: No  . Sexual activity: Not on file

## 2018-02-02 NOTE — Telephone Encounter (Signed)
Patient called concerning compression sock.  Advised him to get Black Vive sock 15,20 per Dr. Sharol Given.

## 2018-02-15 ENCOUNTER — Other Ambulatory Visit (INDEPENDENT_AMBULATORY_CARE_PROVIDER_SITE_OTHER): Payer: Self-pay

## 2018-02-15 ENCOUNTER — Ambulatory Visit: Payer: Self-pay | Admitting: Licensed Clinical Social Worker

## 2018-02-15 DIAGNOSIS — F329 Major depressive disorder, single episode, unspecified: Secondary | ICD-10-CM

## 2018-02-15 DIAGNOSIS — F341 Dysthymic disorder: Secondary | ICD-10-CM

## 2018-02-15 DIAGNOSIS — E1165 Type 2 diabetes mellitus with hyperglycemia: Secondary | ICD-10-CM

## 2018-02-16 LAB — MICROALBUMIN / CREATININE URINE RATIO
Creatinine, Urine: 55.6 mg/dL
Microalb/Creat Ratio: 100.2 mg/g{creat} — ABNORMAL HIGH (ref 0.0–30.0)
Microalbumin, Urine: 55.7 ug/mL

## 2018-02-21 ENCOUNTER — Ambulatory Visit: Payer: Medicaid Other | Attending: Family Medicine | Admitting: Podiatry

## 2018-02-21 DIAGNOSIS — L97501 Non-pressure chronic ulcer of other part of unspecified foot limited to breakdown of skin: Secondary | ICD-10-CM
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
## 2018-02-22 ENCOUNTER — Ambulatory Visit: Payer: Self-pay | Admitting: Internal Medicine

## 2018-02-23 NOTE — Progress Notes (Signed)
Subjective: 47 year old male presents to the [facility] for concerns of a callus, possible ulcer to left foot.  He states he recently underwent a below-knee amputation with Dr. Sharol Given.  Dr. Sharol Given has recently trimmed the callus on the left foot.  He has not has any swelling or redness or drainage coming from the area.  He does have dry skin present.  Denies any other open sores or any drainage. Denies any systemic complaints such as fevers, chills, nausea, vomiting. No acute changes since last appointment, and no other complaints at this time.   Objective: AAO x3, NAD; presents in a wheelchair. DP/PT pulses palpable  Protective sensation decreased with Derrel Nip monofilament On the left foot submetatarsal one is a hyperkeratotic lesion with evidence of dried blood underneath the callus.Marland Kitchen  Upon debridement there is a superficial wound present measuring 0.3 x 0.3 x 0.2 cm.  There is no probing to bone, undermining or tunneling.  There is no surrounding erythema, ascending cellulitis.  There is no fluctuation or crepitation.  There is no malodor.  There is dry skin present on the foot but there is no skin fissures open sores identified otherwise. No other open lesions or pre-ulcerative lesions.  No pain with calf compression, swelling, warmth, erythema  Assessment: 47 year old male left submetatarsal 1 ulceration  Plan: -All treatment options discussed with the patient including all alternatives, risks, complications.  -I was able to debride the hyperkeratotic lesion left foot reveals small superficial wound present.  Continue small amount of antibiotic ointment dressing changes daily.  Also continue offloading.  Dr. Sharol Given has put a pad inside of his shoe to help offload the area.  I discussed that I can see him in our office and begin to begin further offload this area for him as well.  He has an appointment with Dr. Sharol Given next week.  I offered to see him in the office for  follow-up evaluation of the wound but since he sees Dr. Sharol Given next week he wants to continue to follow-up with him.  Monitor for any signs or symptoms of infection to call the office immediately should any occur or go to the emergency room. -Patient encouraged to call the office with any questions, concerns, change in symptoms.   Trula Slade DPM

## 2018-03-02 ENCOUNTER — Ambulatory Visit (INDEPENDENT_AMBULATORY_CARE_PROVIDER_SITE_OTHER): Payer: Self-pay | Admitting: Orthopedic Surgery

## 2018-03-03 ENCOUNTER — Ambulatory Visit (INDEPENDENT_AMBULATORY_CARE_PROVIDER_SITE_OTHER): Payer: Medicaid Other | Admitting: Orthopedic Surgery

## 2018-03-03 ENCOUNTER — Encounter (INDEPENDENT_AMBULATORY_CARE_PROVIDER_SITE_OTHER): Payer: Self-pay | Admitting: Orthopedic Surgery

## 2018-03-03 DIAGNOSIS — L97521 Non-pressure chronic ulcer of other part of left foot limited to breakdown of skin: Secondary | ICD-10-CM | POA: Diagnosis not present

## 2018-03-03 DIAGNOSIS — Z89511 Acquired absence of right leg below knee: Secondary | ICD-10-CM

## 2018-03-03 DIAGNOSIS — M6702 Short Achilles tendon (acquired), left ankle: Secondary | ICD-10-CM

## 2018-03-03 HISTORY — DX: Short Achilles tendon (acquired), left ankle: M67.02

## 2018-03-03 NOTE — Progress Notes (Signed)
Office Visit Note   Patient: [name] [name]           Date of Birth: Jun 26, 1971           MRN: [id number] Visit Date: 03/03/2018              Requested by: Julieanne Manson, MD [address] [place], [place] [address] PCP: Julieanne Manson, MD  Chief Complaint  Patient presents with  . 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  Patient has a medical sock from the left lower extremity knee-high and has a stump shrinker on the right.  Patient states he just received his Medicaid and will follow-up with [facility] for prosthetic fitting.  Assessment & Plan: Visit Diagnoses:  1. History of right below knee amputation (HCC)   2. Non-pressure chronic ulcer of other part of left foot limited to breakdown of skin (HCC)   3. Achilles tendon contracture, left     Plan: Follow-up with [facility] for prosthetic fitting on the right recommended moisturizing lotion either Shea butter or lanolin-based products to moisturize both lower extremities.  Patient was given instruction and demonstrated how to stretch the Achilles on the left and how to stretch the knee on the right discussed the critical importance of doing both of these.  Follow-Up Instructions: Return in about 2 months (around 05/03/2018).   Ortho Exam  Patient is alert, oriented, no adenopathy, well-dressed, normal affect, normal respiratory effort. Examination patient has dry cracked skin on both lower extremities but there is no ulcers no cellulitis no signs of infection.  He has a Waggoner grade 1 ulcer beneath the first metatarsal head of the left foot.  After informed consent a 10 blade knife was used to debride the skin and soft tissue back to healthy viable tissue the ulcer is 10 mm in diameter 1 mm deep and this has healthy tissue at the base.   Patient is developing equinus contracture on the left and lacks about 10 degrees to full extension with his knee extended heel cord stretching was demonstrated and discussed in the importance reviewed.  He does have full extension of the right knee and the importance of working on knee extension was also discussed.  We will follow-up with [facility] and then follow up with [facility] for gait training follow-up in the office.  Imaging: No results found. No images are attached to the encounter.  Labs: Lab Results  Component Value Date   HGBA1C 5.7 (H) 01/21/2018   HGBA1C 11.1 (H) 10/11/2017   ESRSEDRATE 75 (H) 10/20/2017   CRP 1.9 (H) 10/20/2017   REPTSTATUS 12/02/2017 FINAL 11/27/2017   REPTSTATUS 12/03/2017 FINAL 11/27/2017   CULT NO GROWTH 5 DAYS 11/27/2017   CULT NO GROWTH 5 DAYS 11/27/2017    @LABSALLVALUES (HGBA1)@  There is no height or weight on file to calculate BMI.  Orders:  No orders of the defined types were placed in this encounter.  No orders of the defined types were placed in this encounter.    Procedures: No procedures performed  Clinical Data: No additional findings.  ROS:  All other systems negative, except as noted in the HPI. Review of Systems  Objective: Vital Signs: There were no vitals taken for this visit.  Specialty Comments:  No specialty comments available.  PMFS History:  Patient Active Problem List   Diagnosis Date Noted  . 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 Non-pressure chronic ulcer of other part of left foot limited to breakdown of skin (HCC) 02/02/2018  . 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 History of right below knee amputation (HCC) 12/22/2017  . Fever   . Acute posthemorrhagic anemia   . Leukocytosis   . Falls 11/24/2017  . Benign essential HTN 11/24/2017  . Normocytic normochromic anemia 11/24/2017  . Sepsis (HCC) 10/11/2017  . Uncontrolled type 2 diabetes mellitus with hyperglycemia (HCC) 10/11/2017    . ARF (acute renal failure) (HCC) 10/11/2017  . Nausea & vomiting 10/11/2017   Past Medical History:  Diagnosis Date  . ARF (acute renal failure) (HCC) 09/2017  . Diabetes mellitus without complication (HCC)   . Hypertension   . Necrotizing fasciitis (HCC) 10/13/2017  . Wound dehiscence 11/24/2017    Family History  Problem Relation Age of Onset  . Diabetes Mellitus II Mother   . Depression Mother   . Heart disease Father        CABG x 4.  04/2017    Past Surgical History:  Procedure Laterality Date  . AMPUTATION Right 10/13/2017   Procedure: RIGHT BELOW KNEE AMPUTATION;  Surgeon: Nadara Mustard, MD;  Location: [facility] OR;  Service: Orthopedics;  Laterality: Right;  . AMPUTATION Right 11/24/2017   Procedure: AMPUTATION BELOW KNEE REVISION;  Surgeon: Nadara Mustard, MD;  Location: [facility] OR;  Service: Orthopedics;  Laterality: Right;  . NO PAST SURGERIES     Social History   Occupational History  . Occupation: unemployed  Tobacco Use  . Smoking status: Current Every Day Smoker    Packs/day: 0.25    Years: 35.00    Pack years: 8.75    Types: Cigarettes  . Smokeless tobacco: Never Used  . Tobacco comment: stopped, but back on 1 cigarette daily.  Substance and Sexual Activity  . Alcohol use: No    Frequency: Never  . Drug use: No  . Sexual activity: Not on file

## 2018-03-08 ENCOUNTER — Other Ambulatory Visit: Payer: Self-pay | Admitting: Internal Medicine

## 2018-03-08 MED ORDER — LISINOPRIL 5 MG PO TABS: 5.0000 mg | ORAL_TABLET | Freq: Every day | ORAL | 11 refills | Status: DC

## 2018-03-22 ENCOUNTER — Ambulatory Visit: Payer: Self-pay | Admitting: Internal Medicine

## 2018-03-22 ENCOUNTER — Other Ambulatory Visit: Payer: Self-pay | Admitting: Licensed Clinical Social Worker

## 2018-03-31 ENCOUNTER — Ambulatory Visit: Payer: Medicaid Other | Admitting: Podiatry

## 2018-04-06 ENCOUNTER — Telehealth (INDEPENDENT_AMBULATORY_CARE_PROVIDER_SITE_OTHER): Payer: Self-pay | Admitting: Orthopedic Surgery

## 2018-04-06 NOTE — Telephone Encounter (Signed)
[facility]  Prosthetic approval   Pt called stating the clinic is still in need of pt paperwork. Pt care will not be [place] until the [facility] has received paperwork per pt.

## 2018-04-07 NOTE — Telephone Encounter (Signed)
Paperwork pulled for signature for Dr. Sharol Given on Monday.

## 2018-04-11 ENCOUNTER — Telehealth (INDEPENDENT_AMBULATORY_CARE_PROVIDER_SITE_OTHER): Payer: Self-pay | Admitting: Orthopedic Surgery

## 2018-04-11 NOTE — Telephone Encounter (Signed)
Last 3 ov notes faxed to [facility]

## 2018-04-21 ENCOUNTER — Encounter (HOSPITAL_BASED_OUTPATIENT_CLINIC_OR_DEPARTMENT_OTHER): Payer: Medicaid Other | Attending: Internal Medicine

## 2018-04-21 ENCOUNTER — Other Ambulatory Visit (HOSPITAL_BASED_OUTPATIENT_CLINIC_OR_DEPARTMENT_OTHER): Payer: Self-pay | Admitting: Internal Medicine

## 2018-04-21 ENCOUNTER — Ambulatory Visit (HOSPITAL_COMMUNITY)
Admission: RE | Admit: 2018-04-21 | Discharge: 2018-04-21 | Disposition: A | Discharge status: Home / Self Care | Payer: Medicaid Other | Source: Ambulatory Visit | Attending: Internal Medicine | Admitting: Internal Medicine

## 2018-04-21 DIAGNOSIS — E1142 Type 2 diabetes mellitus with diabetic polyneuropathy: Secondary | ICD-10-CM | POA: Diagnosis not present

## 2018-04-21 DIAGNOSIS — I10 Essential (primary) hypertension: Secondary | ICD-10-CM | POA: Insufficient documentation

## 2018-04-21 DIAGNOSIS — M869 Osteomyelitis, unspecified: Secondary | ICD-10-CM

## 2018-04-21 DIAGNOSIS — Z7984 Long term (current) use of oral hypoglycemic drugs: Secondary | ICD-10-CM | POA: Diagnosis not present

## 2018-04-21 DIAGNOSIS — E11621 Type 2 diabetes mellitus with foot ulcer: Secondary | ICD-10-CM | POA: Insufficient documentation

## 2018-04-21 DIAGNOSIS — F17200 Nicotine dependence, unspecified, uncomplicated: Secondary | ICD-10-CM | POA: Insufficient documentation

## 2018-04-21 DIAGNOSIS — L97522 Non-pressure chronic ulcer of other part of left foot with fat layer exposed: Secondary | ICD-10-CM | POA: Insufficient documentation

## 2018-04-21 DIAGNOSIS — L97528 Non-pressure chronic ulcer of other part of left foot with other specified severity: Secondary | ICD-10-CM | POA: Diagnosis present

## 2018-04-28 ENCOUNTER — Encounter (HOSPITAL_BASED_OUTPATIENT_CLINIC_OR_DEPARTMENT_OTHER): Payer: Medicaid Other | Attending: Internal Medicine

## 2018-04-28 DIAGNOSIS — L97822 Non-pressure chronic ulcer of other part of left lower leg with fat layer exposed: Secondary | ICD-10-CM | POA: Diagnosis not present

## 2018-04-28 DIAGNOSIS — E1142 Type 2 diabetes mellitus with diabetic polyneuropathy: Secondary | ICD-10-CM | POA: Diagnosis not present

## 2018-04-28 DIAGNOSIS — I10 Essential (primary) hypertension: Secondary | ICD-10-CM | POA: Insufficient documentation

## 2018-04-28 DIAGNOSIS — E11621 Type 2 diabetes mellitus with foot ulcer: Secondary | ICD-10-CM | POA: Diagnosis present

## 2018-05-04 ENCOUNTER — Ambulatory Visit (INDEPENDENT_AMBULATORY_CARE_PROVIDER_SITE_OTHER): Payer: Medicaid Other | Admitting: Orthopedic Surgery

## 2018-05-05 DIAGNOSIS — E11621 Type 2 diabetes mellitus with foot ulcer: Secondary | ICD-10-CM | POA: Diagnosis not present

## 2018-05-06 ENCOUNTER — Telehealth (INDEPENDENT_AMBULATORY_CARE_PROVIDER_SITE_OTHER): Payer: Self-pay | Admitting: Orthopedic Surgery

## 2018-05-06 ENCOUNTER — Other Ambulatory Visit (INDEPENDENT_AMBULATORY_CARE_PROVIDER_SITE_OTHER): Payer: Self-pay

## 2018-05-06 DIAGNOSIS — Z89511 Acquired absence of right leg below knee: Secondary | ICD-10-CM

## 2018-05-06 NOTE — Telephone Encounter (Signed)
I called and lm on vm to advise that I did not see a note in his chart where physical therapy had been ordered but if he is needing it for out patient physical therapy gait training for his prosthetic to call and let me know and I can send that over to [facility] neuro rehab.

## 2018-05-06 NOTE — Telephone Encounter (Signed)
Patient called stating that Dr. Sharol Given was suppose to do a referral for PT and has not heard anything.  Please advise patient.  CB#[phone].  Thank you.

## 2018-05-06 NOTE — Telephone Encounter (Signed)
Pt called back and advised yes this is for prosthetic gait training. Order in chart for neuro rehab.

## 2018-05-11 ENCOUNTER — Ambulatory Visit (INDEPENDENT_AMBULATORY_CARE_PROVIDER_SITE_OTHER): Payer: Medicaid Other | Admitting: Family

## 2018-05-11 ENCOUNTER — Encounter (INDEPENDENT_AMBULATORY_CARE_PROVIDER_SITE_OTHER): Payer: Self-pay | Admitting: Family

## 2018-05-11 VITALS — Ht 77.0 in | Wt 200.0 lb

## 2018-05-11 DIAGNOSIS — Z89511 Acquired absence of right leg below knee: Secondary | ICD-10-CM

## 2018-05-11 DIAGNOSIS — L97521 Non-pressure chronic ulcer of other part of left foot limited to breakdown of skin: Secondary | ICD-10-CM

## 2018-05-11 NOTE — Progress Notes (Signed)
Office Visit Note   Patient: [name] [name]           Date of Birth: [birth year]           MRN: [id number] Visit Date: 05/11/2018              Requested by: Mack Hook, MD [place], [place] [address] PCP: Mack Hook, MD  Chief Complaint  Patient presents with  . 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  Patient is following with [facility] for prosthetic fitting.  Assessment & Plan: Visit Diagnoses:  1. Non-pressure chronic ulcer of other part of left foot limited to breakdown of skin ([place])   2. History of right below knee amputation ([place])     Plan: Follow-up with [facility] for prosthetic fitting on the right recommended moisturizing lotion either Shea butter or lanolin-based products to moisturize both lower extremities.  Patient was given instruction and demonstrated how to stretch the Achilles on the left and how to stretch the knee on the right discussed the critical importance of doing both of these. Patient is going to follow with the wound center for the left foot.  promogran dressing applied to left foot.  Follow-Up Instructions: Return if symptoms worsen or fail to improve.   Ortho Exam  Patient is alert, oriented, no adenopathy, well-dressed, normal affect, normal respiratory effort. Examination patient has dry cracked skin on both lower extremities but there is no ulcers no cellulitis no signs of infection.  He has a Wagner grade 1 ulcer beneath the first metatarsal head of the left foot.  The ulcer is 3 mm in diameter 1 mm deep and this has healthy tissue at the base.  He does have full extension of the right knee.  Imaging: No results found. No images are attached to the encounter.  Labs: Lab Results  Component Value  Date   HGBA1C 5.7 (H) 01/21/2018   HGBA1C 11.1 (H) 10/11/2017   ESRSEDRATE 75 (H) 10/20/2017   CRP 1.9 (H) 10/20/2017   REPTSTATUS 12/02/2017 FINAL 11/27/2017   REPTSTATUS 12/03/2017 FINAL 11/27/2017   CULT NO GROWTH 5 DAYS 11/27/2017   CULT NO GROWTH 5 DAYS 11/27/2017    @LABSALLVALUES (HGBA1)@  Body mass index is 23.72 kg/m.  Orders:  No orders of the defined types were placed in this encounter.  No orders of the defined types were placed in this encounter.    Procedures: No procedures performed  Clinical Data: No additional findings.  ROS:  All other systems negative, except as noted in the HPI. Review of Systems  Constitutional: Negative for chills and fever.  Skin: Positive for wound.    Objective: Vital Signs: Ht 6\' 5"  (1.956 m)   Wt 200 lb (90.7 kg)   BMI 23.72 kg/m   Specialty Comments:  No specialty comments available.  PMFS History: Patient Active Problem List   Diagnosis Date Noted  . 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 Non-pressure chronic ulcer of other part of left foot limited to breakdown of skin ([place]) 02/02/2018  . 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 History of right below knee amputation ([place]) 12/22/2017  . Fever   . Acute posthemorrhagic anemia   . Leukocytosis   . Falls  11/24/2017  . Benign essential HTN 11/24/2017  . Normocytic normochromic anemia 11/24/2017  . Sepsis ([place]) 10/11/2017  . Uncontrolled type 2 diabetes mellitus with hyperglycemia ([place]) 10/11/2017  . ARF (acute renal failure) ([place]) 10/11/2017  . Nausea & vomiting 10/11/2017   Past Medical History:  Diagnosis Date  . ARF (acute renal failure) ([place]) 09/2017  . Diabetes mellitus without complication ([place])   . Hypertension   . Necrotizing fasciitis ([place]) 10/13/2017  . Wound dehiscence 11/24/2017    Family History  Problem Relation Age of Onset  . Diabetes Mellitus II Mother   . Depression Mother   . Heart disease Father        CABG x 4.   04/2017    Past Surgical History:  Procedure Laterality Date  . AMPUTATION Right 10/13/2017   Procedure: RIGHT BELOW KNEE AMPUTATION;  Surgeon: [name], MD;  Location: [facility];  Service: Orthopedics;  Laterality: Right;  . AMPUTATION Right 11/24/2017   Procedure: AMPUTATION BELOW KNEE REVISION;  Surgeon: [name], MD;  Location: [facility];  Service: Orthopedics;  Laterality: Right;  . NO PAST SURGERIES     Social History   Occupational History  . Occupation: unemployed  Tobacco Use  . Smoking status: Current Every Day Smoker    Packs/day: 0.25    Years: 35.00    Pack years: 8.75    Types: Cigarettes  . Smokeless tobacco: Never Used  . Tobacco comment: stopped, but back on 1 cigarette daily.  Substance and Sexual Activity  . Alcohol use: No    Frequency: Never  . Drug use: No  . Sexual activity: Not on file

## 2018-05-12 ENCOUNTER — Other Ambulatory Visit (INDEPENDENT_AMBULATORY_CARE_PROVIDER_SITE_OTHER): Payer: Self-pay

## 2018-05-12 DIAGNOSIS — Z89511 Acquired absence of right leg below knee: Secondary | ICD-10-CM

## 2018-05-12 DIAGNOSIS — E11621 Type 2 diabetes mellitus with foot ulcer: Secondary | ICD-10-CM | POA: Diagnosis not present

## 2018-05-19 ENCOUNTER — Other Ambulatory Visit: Payer: Self-pay

## 2018-05-19 ENCOUNTER — Encounter: Payer: Self-pay | Admitting: Physical Therapy

## 2018-05-19 ENCOUNTER — Ambulatory Visit: Payer: Medicaid Other | Attending: Internal Medicine | Admitting: Physical Therapy

## 2018-05-19 DIAGNOSIS — M25662 Stiffness of left knee, not elsewhere classified: Secondary | ICD-10-CM

## 2018-05-19 DIAGNOSIS — M25661 Stiffness of right knee, not elsewhere classified: Secondary | ICD-10-CM

## 2018-05-19 DIAGNOSIS — R2681 Unsteadiness on feet: Secondary | ICD-10-CM | POA: Diagnosis present

## 2018-05-19 DIAGNOSIS — R296 Repeated falls: Secondary | ICD-10-CM | POA: Diagnosis present

## 2018-05-19 DIAGNOSIS — M6281 Muscle weakness (generalized): Secondary | ICD-10-CM

## 2018-05-19 DIAGNOSIS — R2689 Other abnormalities of gait and mobility: Secondary | ICD-10-CM | POA: Diagnosis present

## 2018-05-19 DIAGNOSIS — R293 Abnormal posture: Secondary | ICD-10-CM

## 2018-05-20 DIAGNOSIS — E11621 Type 2 diabetes mellitus with foot ulcer: Secondary | ICD-10-CM | POA: Diagnosis not present

## 2018-05-20 NOTE — Therapy (Signed)
Nausea & vomiting 10/11/2017    Jamey Reas PT, DPT 05/20/2018, 11:10 AM  [facility] [address] [place], [place], [address] Phone: [phone]   Fax:  [phone]  Name: [name] [name] MRN: [id number] Date of Birth: [birth year]  Nausea & vomiting 10/11/2017    Jamey Reas PT, DPT 05/20/2018, 11:10 AM  [facility] [address] [place], [place], [address] Phone: [phone]   Fax:  [phone]  Name: [name] [name] MRN: [id number] Date of Birth: [birth year]  [facility] [address] [place], [place], [address] Phone: [phone]   Fax:  [phone]  Physical Therapy Evaluation  Patient Details  Name: [name] [name] MRN: [id number] Date of Birth: [birth year] Referring Provider: Meridee Score, MD   Encounter Date: 05/19/2018  PT End of Session - 05/19/18 1653    Visit Number  1    Number of Visits  27    Authorization Type  Medicaid    PT Start Time  1307    PT Stop Time  1405    PT Time Calculation (min)  58 min    Equipment Utilized During Treatment  Gait belt    Activity Tolerance  Patient tolerated treatment well;Patient limited by fatigue;Patient limited by pain    Behavior During Therapy  [facility] for tasks assessed/performed;Flat affect       Past Medical History:  Diagnosis Date  . ARF (acute renal failure) ([place]) 09/2017  . Diabetes mellitus without complication ([place])   . Hypertension   . Necrotizing fasciitis ([place]) 10/13/2017  . Wound dehiscence 11/24/2017    Past Surgical History:  Procedure Laterality Date  . AMPUTATION Right 10/13/2017   Procedure: RIGHT BELOW KNEE AMPUTATION;  Surgeon: [name], MD;  Location: [facility];  Service: Orthopedics;  Laterality: Right;  . AMPUTATION Right 11/24/2017   Procedure: AMPUTATION BELOW KNEE REVISION;  Surgeon: [name], MD;  Location: [facility];  Service: Orthopedics;  Laterality: Right;  . NO PAST SURGERIES      There were no vitals filed for this visit.   Subjective Assessment - 05/19/18 1307    Subjective  This 47yo male was referred to PT by Meridee Score, MD on 05/12/2018. He underwent a right Transtibial Amputation on 10/13/2017 with revision 11/24/2017. He also has Left Wagner 1 ulcer beneath first & fifth metatarsal head left foot with heel cord contracture. His first prosthesis was delivered 04/26/2018.  He presents for evaluation sitting in w/c with prosthetic liner only on limb.     Patient is accompained by:   Family member Lolita Cram, mother    Pertinent History  R TTA, DM2, HTN, peripheral neuropathy, acute renal failure    Limitations  Lifting;Standing;Walking;House hold activities    Patient Stated Goals  To use prosthesis to walk in home & community,     Currently in Pain?  Yes    Pain Score  7  In last week, worst 8/10, best 4-5/10    Pain Location  Leg lower leg & foot    Pain Orientation  Left;Posterior    Pain Descriptors / Indicators  Throbbing;Pins and needles;Numbness    Pain Type  Neuropathic pain    Pain Onset  More than a month ago    Pain Frequency  Constant    Aggravating Factors   unknown    Pain Relieving Factors  unknown    Multiple Pain Sites  Yes    Pain Location  Leg    Pain Orientation  Right    Pain Descriptors / Indicators  Throbbing    Pain Type  Phantom pain;Neuropathic pain    Pain Onset  More than a month ago    Pain Frequency  Constant    Aggravating Factors   unknown    Pain Relieving Factors  unknown         [facility] PT Assessment - 05/19/18 1315      Assessment   Medical Diagnosis  Right Transtibial Amputation    Referring  Nausea & vomiting 10/11/2017    Jamey Reas PT, DPT 05/20/2018, 11:10 AM  [facility] [address] [place], [place], [address] Phone: [phone]   Fax:  [phone]  Name: [name] [name] MRN: [id number] Date of Birth: [birth year]  [facility] [address] [place], [place], [address] Phone: [phone]   Fax:  [phone]  Physical Therapy Evaluation  Patient Details  Name: [name] [name] MRN: [id number] Date of Birth: [birth year] Referring Provider: Meridee Score, MD   Encounter Date: 05/19/2018  PT End of Session - 05/19/18 1653    Visit Number  1    Number of Visits  27    Authorization Type  Medicaid    PT Start Time  1307    PT Stop Time  1405    PT Time Calculation (min)  58 min    Equipment Utilized During Treatment  Gait belt    Activity Tolerance  Patient tolerated treatment well;Patient limited by fatigue;Patient limited by pain    Behavior During Therapy  [facility] for tasks assessed/performed;Flat affect       Past Medical History:  Diagnosis Date  . ARF (acute renal failure) ([place]) 09/2017  . Diabetes mellitus without complication ([place])   . Hypertension   . Necrotizing fasciitis ([place]) 10/13/2017  . Wound dehiscence 11/24/2017    Past Surgical History:  Procedure Laterality Date  . AMPUTATION Right 10/13/2017   Procedure: RIGHT BELOW KNEE AMPUTATION;  Surgeon: [name], MD;  Location: [facility];  Service: Orthopedics;  Laterality: Right;  . AMPUTATION Right 11/24/2017   Procedure: AMPUTATION BELOW KNEE REVISION;  Surgeon: [name], MD;  Location: [facility];  Service: Orthopedics;  Laterality: Right;  . NO PAST SURGERIES      There were no vitals filed for this visit.   Subjective Assessment - 05/19/18 1307    Subjective  This 47yo male was referred to PT by Meridee Score, MD on 05/12/2018. He underwent a right Transtibial Amputation on 10/13/2017 with revision 11/24/2017. He also has Left Wagner 1 ulcer beneath first & fifth metatarsal head left foot with heel cord contracture. His first prosthesis was delivered 04/26/2018.  He presents for evaluation sitting in w/c with prosthetic liner only on limb.     Patient is accompained by:   Family member Lolita Cram, mother    Pertinent History  R TTA, DM2, HTN, peripheral neuropathy, acute renal failure    Limitations  Lifting;Standing;Walking;House hold activities    Patient Stated Goals  To use prosthesis to walk in home & community,     Currently in Pain?  Yes    Pain Score  7  In last week, worst 8/10, best 4-5/10    Pain Location  Leg lower leg & foot    Pain Orientation  Left;Posterior    Pain Descriptors / Indicators  Throbbing;Pins and needles;Numbness    Pain Type  Neuropathic pain    Pain Onset  More than a month ago    Pain Frequency  Constant    Aggravating Factors   unknown    Pain Relieving Factors  unknown    Multiple Pain Sites  Yes    Pain Location  Leg    Pain Orientation  Right    Pain Descriptors / Indicators  Throbbing    Pain Type  Phantom pain;Neuropathic pain    Pain Onset  More than a month ago    Pain Frequency  Constant    Aggravating Factors   unknown    Pain Relieving Factors  unknown         [facility] PT Assessment - 05/19/18 1315      Assessment   Medical Diagnosis  Right Transtibial Amputation    Referring  [facility] [address] [place], [place], [address] Phone: [phone]   Fax:  [phone]  Physical Therapy Evaluation  Patient Details  Name: [name] [name] MRN: [id number] Date of Birth: [birth year] Referring Provider: Meridee Score, MD   Encounter Date: 05/19/2018  PT End of Session - 05/19/18 1653    Visit Number  1    Number of Visits  27    Authorization Type  Medicaid    PT Start Time  1307    PT Stop Time  1405    PT Time Calculation (min)  58 min    Equipment Utilized During Treatment  Gait belt    Activity Tolerance  Patient tolerated treatment well;Patient limited by fatigue;Patient limited by pain    Behavior During Therapy  [facility] for tasks assessed/performed;Flat affect       Past Medical History:  Diagnosis Date  . ARF (acute renal failure) ([place]) 09/2017  . Diabetes mellitus without complication ([place])   . Hypertension   . Necrotizing fasciitis ([place]) 10/13/2017  . Wound dehiscence 11/24/2017    Past Surgical History:  Procedure Laterality Date  . AMPUTATION Right 10/13/2017   Procedure: RIGHT BELOW KNEE AMPUTATION;  Surgeon: [name], MD;  Location: [facility];  Service: Orthopedics;  Laterality: Right;  . AMPUTATION Right 11/24/2017   Procedure: AMPUTATION BELOW KNEE REVISION;  Surgeon: [name], MD;  Location: [facility];  Service: Orthopedics;  Laterality: Right;  . NO PAST SURGERIES      There were no vitals filed for this visit.   Subjective Assessment - 05/19/18 1307    Subjective  This 47yo male was referred to PT by Meridee Score, MD on 05/12/2018. He underwent a right Transtibial Amputation on 10/13/2017 with revision 11/24/2017. He also has Left Wagner 1 ulcer beneath first & fifth metatarsal head left foot with heel cord contracture. His first prosthesis was delivered 04/26/2018.  He presents for evaluation sitting in w/c with prosthetic liner only on limb.     Patient is accompained by:   Family member Lolita Cram, mother    Pertinent History  R TTA, DM2, HTN, peripheral neuropathy, acute renal failure    Limitations  Lifting;Standing;Walking;House hold activities    Patient Stated Goals  To use prosthesis to walk in home & community,     Currently in Pain?  Yes    Pain Score  7  In last week, worst 8/10, best 4-5/10    Pain Location  Leg lower leg & foot    Pain Orientation  Left;Posterior    Pain Descriptors / Indicators  Throbbing;Pins and needles;Numbness    Pain Type  Neuropathic pain    Pain Onset  More than a month ago    Pain Frequency  Constant    Aggravating Factors   unknown    Pain Relieving Factors  unknown    Multiple Pain Sites  Yes    Pain Location  Leg    Pain Orientation  Right    Pain Descriptors / Indicators  Throbbing    Pain Type  Phantom pain;Neuropathic pain    Pain Onset  More than a month ago    Pain Frequency  Constant    Aggravating Factors   unknown    Pain Relieving Factors  unknown         [facility] PT Assessment - 05/19/18 1315      Assessment   Medical Diagnosis  Right Transtibial Amputation    Referring  [facility] [address] [place], [place], [address] Phone: [phone]   Fax:  [phone]  Physical Therapy Evaluation  Patient Details  Name: [name] [name] MRN: [id number] Date of Birth: [birth year] Referring Provider: Meridee Score, MD   Encounter Date: 05/19/2018  PT End of Session - 05/19/18 1653    Visit Number  1    Number of Visits  27    Authorization Type  Medicaid    PT Start Time  1307    PT Stop Time  1405    PT Time Calculation (min)  58 min    Equipment Utilized During Treatment  Gait belt    Activity Tolerance  Patient tolerated treatment well;Patient limited by fatigue;Patient limited by pain    Behavior During Therapy  [facility] for tasks assessed/performed;Flat affect       Past Medical History:  Diagnosis Date  . ARF (acute renal failure) ([place]) 09/2017  . Diabetes mellitus without complication ([place])   . Hypertension   . Necrotizing fasciitis ([place]) 10/13/2017  . Wound dehiscence 11/24/2017    Past Surgical History:  Procedure Laterality Date  . AMPUTATION Right 10/13/2017   Procedure: RIGHT BELOW KNEE AMPUTATION;  Surgeon: [name], MD;  Location: [facility];  Service: Orthopedics;  Laterality: Right;  . AMPUTATION Right 11/24/2017   Procedure: AMPUTATION BELOW KNEE REVISION;  Surgeon: [name], MD;  Location: [facility];  Service: Orthopedics;  Laterality: Right;  . NO PAST SURGERIES      There were no vitals filed for this visit.   Subjective Assessment - 05/19/18 1307    Subjective  This 47yo male was referred to PT by Meridee Score, MD on 05/12/2018. He underwent a right Transtibial Amputation on 10/13/2017 with revision 11/24/2017. He also has Left Wagner 1 ulcer beneath first & fifth metatarsal head left foot with heel cord contracture. His first prosthesis was delivered 04/26/2018.  He presents for evaluation sitting in w/c with prosthetic liner only on limb.     Patient is accompained by:   Family member Lolita Cram, mother    Pertinent History  R TTA, DM2, HTN, peripheral neuropathy, acute renal failure    Limitations  Lifting;Standing;Walking;House hold activities    Patient Stated Goals  To use prosthesis to walk in home & community,     Currently in Pain?  Yes    Pain Score  7  In last week, worst 8/10, best 4-5/10    Pain Location  Leg lower leg & foot    Pain Orientation  Left;Posterior    Pain Descriptors / Indicators  Throbbing;Pins and needles;Numbness    Pain Type  Neuropathic pain    Pain Onset  More than a month ago    Pain Frequency  Constant    Aggravating Factors   unknown    Pain Relieving Factors  unknown    Multiple Pain Sites  Yes    Pain Location  Leg    Pain Orientation  Right    Pain Descriptors / Indicators  Throbbing    Pain Type  Phantom pain;Neuropathic pain    Pain Onset  More than a month ago    Pain Frequency  Constant    Aggravating Factors   unknown    Pain Relieving Factors  unknown         [facility] PT Assessment - 05/19/18 1315      Assessment   Medical Diagnosis  Right Transtibial Amputation    Referring

## 2018-05-27 ENCOUNTER — Encounter (HOSPITAL_BASED_OUTPATIENT_CLINIC_OR_DEPARTMENT_OTHER): Payer: Medicaid Other | Attending: Internal Medicine

## 2018-05-27 DIAGNOSIS — L97829 Non-pressure chronic ulcer of other part of left lower leg with unspecified severity: Secondary | ICD-10-CM | POA: Insufficient documentation

## 2018-05-27 DIAGNOSIS — I10 Essential (primary) hypertension: Secondary | ICD-10-CM | POA: Insufficient documentation

## 2018-05-27 DIAGNOSIS — E1140 Type 2 diabetes mellitus with diabetic neuropathy, unspecified: Secondary | ICD-10-CM | POA: Diagnosis not present

## 2018-05-27 DIAGNOSIS — E11621 Type 2 diabetes mellitus with foot ulcer: Secondary | ICD-10-CM | POA: Diagnosis present

## 2018-06-02 ENCOUNTER — Encounter

## 2018-06-03 ENCOUNTER — Ambulatory Visit: Payer: Medicaid Other | Attending: Internal Medicine | Admitting: Physical Therapy

## 2018-06-03 ENCOUNTER — Encounter: Payer: Self-pay | Admitting: Physical Therapy

## 2018-06-03 DIAGNOSIS — R2689 Other abnormalities of gait and mobility: Secondary | ICD-10-CM | POA: Insufficient documentation

## 2018-06-03 DIAGNOSIS — R296 Repeated falls: Secondary | ICD-10-CM | POA: Insufficient documentation

## 2018-06-03 DIAGNOSIS — R2681 Unsteadiness on feet: Secondary | ICD-10-CM | POA: Diagnosis present

## 2018-06-03 DIAGNOSIS — M6281 Muscle weakness (generalized): Secondary | ICD-10-CM | POA: Insufficient documentation

## 2018-06-03 NOTE — Patient Instructions (Signed)

## 2018-06-03 NOTE — Therapy (Addendum)
[facility] [address] [place], [place], [address] Phone: [phone]   Fax:  [phone]  Physical Therapy Treatment  Patient Details  Name: [name] [name] MRN: [id number] Date of Birth: 1971/08/30 Referring Provider: Meridee Score, MD   Encounter Date: 06/03/2018  PT End of Session - 06/03/18 1453    Visit Number  2    Number of Visits  27    Authorization Type  Medicaid    PT Start Time  6433    PT Stop Time  1530    PT Time Calculation (min)  43 min    Equipment Utilized During Treatment  Gait belt    Activity Tolerance  Patient tolerated treatment well;Patient limited by fatigue;Patient limited by pain    Behavior During Therapy  [facility] for tasks assessed/performed;Flat affect       06/03/18 1453  PT Visits / Re-Eval  Visit Number 2  Number of Visits 27  Authorization  Authorization Type Medicaid  Authorization Time Period 3 visits from 7/12 through 06/23/18  Authorization - Visit Number 1  Authorization - Number of Visits 3  PT Time Calculation  PT Start Time 2951  PT Stop Time 1530  PT Time Calculation (min) 43 min  PT - End of Session  Equipment Utilized During Treatment Gait belt  Activity Tolerance Patient tolerated treatment well;Patient limited by fatigue;Patient limited by pain  Behavior During Therapy [facility] for tasks assessed/performed;Flat affect    Past Medical History:  Diagnosis Date  . ARF (acute renal failure) ([place]) 09/2017  . Diabetes mellitus without complication ([place])   . Hypertension   . Necrotizing fasciitis ([place]) 10/13/2017  . Wound dehiscence 11/24/2017    Past Surgical History:  Procedure Laterality Date  . AMPUTATION Right 10/13/2017   Procedure: RIGHT BELOW KNEE AMPUTATION;  Surgeon: [name], MD;  Location: [facility];  Service: Orthopedics;  Laterality: Right;  . AMPUTATION Right 11/24/2017   Procedure: AMPUTATION BELOW KNEE REVISION;  Surgeon: [name], MD;   Location: [place] [facility];  Service: Orthopedics;  Laterality: Right;  . NO PAST SURGERIES      There were no vitals filed for this visit.  Subjective Assessment - 06/03/18 1450    Patient is accompained by:  Family member    Pertinent History  R TTA, DM2, HTN, peripheral neuropathy, acute renal failure    Limitations  Lifting;Standing;Walking;House hold activities    Patient Stated Goals  To use prosthesis to walk in home & community,     Currently in Pain?  Yes    Pain Score  6     Pain Location  Leg    Pain Orientation  Left    Pain Descriptors / Indicators  Throbbing;Numbness;Pins and needles    Pain Onset  More than a month ago    Pain Frequency  Constant    Aggravating Factors   unknown    Pain Relieving Factors  unknown    Multiple Pain Sites  No    Pain Score  0    Pain Location  Leg    Pain Orientation  Right           [facility] Adult PT Treatment/Exercise - 06/03/18 1700      Transfers   Transfers  Sit to Stand;Stand to Sit    Sit to Stand  5: Supervision;With upper extremity assist;From chair/3-in-1    Stand to Sit  5: Supervision;With upper extremity assist;To chair/3-in-1    Comments  needed sink  balance and proprioception and continued to address transfers/gait with prosthesis. Pt is progressing and should benefit from continued PT to progress toward unmet goals.     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, left foot wounds with neuropathy    PT Frequency  2x / week 1x/wk for 3 weeks, then 2x/wk for 12 weeks    PT Duration  Other (comment) 15 weeks total for 27 visits permitted with Medicaid    PT Treatment/Interventions  ADLs/Self Care Home Management;Canalith Repostioning;DME Instruction;Gait training;Stair training;Functional mobility training;Therapeutic activities;Therapeutic exercise;Balance training;Neuromuscular re-education;Patient/family education;Prosthetic Training;Orthotic Fit/Training;Manual techniques;Vestibular    PT Next Visit Plan  review prosthetic care, prosthetic gait training with RW    Consulted and Agree with Plan of Care  Patient;Family member/caregiver    Family Member Consulted  mother       Patient will benefit from skilled therapeutic  intervention in order to improve the following deficits and impairments:  Abnormal gait, Decreased activity tolerance, Decreased balance, Decreased coordination, Decreased endurance, Decreased knowledge of use of DME, Decreased mobility, Decreased range of motion, Decreased skin integrity, Decreased strength, Dizziness, Difficulty walking, Increased edema, Impaired flexibility, Impaired sensation, Postural dysfunction, Pain, Prosthetic Dependency  Visit Diagnosis: Muscle weakness (generalized)  Unsteadiness on feet  Other abnormalities of gait and mobility  Repeated falls     Problem List Patient Active Problem List   Diagnosis Date Noted  . 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 Non-pressure chronic ulcer of other part of left foot limited to breakdown of skin ([place]) 02/02/2018  . 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 History of right below knee amputation ([place]) 12/22/2017  . Fever   . Acute posthemorrhagic anemia   . Leukocytosis   . Falls 11/24/2017  . Benign essential HTN 11/24/2017  . Normocytic normochromic anemia 11/24/2017  . Sepsis ([place]) 10/11/2017  . Uncontrolled type 2 diabetes mellitus with hyperglycemia ([place]) 10/11/2017  . ARF (acute renal failure) ([place]) 10/11/2017  . Nausea & vomiting 10/11/2017    Willow Ora, PTA, [facility] [address] [place], [place] [address] [phone] 06/04/18, 1:50 PM   Name: [name] [name] MRN: [id number] Date of Birth: 1971-10-20  balance and proprioception and continued to address transfers/gait with prosthesis. Pt is progressing and should benefit from continued PT to progress toward unmet goals.     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, left foot wounds with neuropathy    PT Frequency  2x / week 1x/wk for 3 weeks, then 2x/wk for 12 weeks    PT Duration  Other (comment) 15 weeks total for 27 visits permitted with Medicaid    PT Treatment/Interventions  ADLs/Self Care Home Management;Canalith Repostioning;DME Instruction;Gait training;Stair training;Functional mobility training;Therapeutic activities;Therapeutic exercise;Balance training;Neuromuscular re-education;Patient/family education;Prosthetic Training;Orthotic Fit/Training;Manual techniques;Vestibular    PT Next Visit Plan  review prosthetic care, prosthetic gait training with RW    Consulted and Agree with Plan of Care  Patient;Family member/caregiver    Family Member Consulted  mother       Patient will benefit from skilled therapeutic  intervention in order to improve the following deficits and impairments:  Abnormal gait, Decreased activity tolerance, Decreased balance, Decreased coordination, Decreased endurance, Decreased knowledge of use of DME, Decreased mobility, Decreased range of motion, Decreased skin integrity, Decreased strength, Dizziness, Difficulty walking, Increased edema, Impaired flexibility, Impaired sensation, Postural dysfunction, Pain, Prosthetic Dependency  Visit Diagnosis: Muscle weakness (generalized)  Unsteadiness on feet  Other abnormalities of gait and mobility  Repeated falls     Problem List Patient Active Problem List   Diagnosis Date Noted  . 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 Non-pressure chronic ulcer of other part of left foot limited to breakdown of skin ([place]) 02/02/2018  . 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 History of right below knee amputation ([place]) 12/22/2017  . Fever   . Acute posthemorrhagic anemia   . Leukocytosis   . Falls 11/24/2017  . Benign essential HTN 11/24/2017  . Normocytic normochromic anemia 11/24/2017  . Sepsis ([place]) 10/11/2017  . Uncontrolled type 2 diabetes mellitus with hyperglycemia ([place]) 10/11/2017  . ARF (acute renal failure) ([place]) 10/11/2017  . Nausea & vomiting 10/11/2017    Willow Ora, PTA, [facility] [address] [place], [place] [address] [phone] 06/04/18, 1:50 PM   Name: [name] [name] MRN: [id number] Date of Birth: 1971-10-20  [facility] [address] [place], [place], [address] Phone: [phone]   Fax:  [phone]  Physical Therapy Treatment  Patient Details  Name: [name] [name] MRN: [id number] Date of Birth: 1971/08/30 Referring Provider: Meridee Score, MD   Encounter Date: 06/03/2018  PT End of Session - 06/03/18 1453    Visit Number  2    Number of Visits  27    Authorization Type  Medicaid    PT Start Time  6433    PT Stop Time  1530    PT Time Calculation (min)  43 min    Equipment Utilized During Treatment  Gait belt    Activity Tolerance  Patient tolerated treatment well;Patient limited by fatigue;Patient limited by pain    Behavior During Therapy  [facility] for tasks assessed/performed;Flat affect       06/03/18 1453  PT Visits / Re-Eval  Visit Number 2  Number of Visits 27  Authorization  Authorization Type Medicaid  Authorization Time Period 3 visits from 7/12 through 06/23/18  Authorization - Visit Number 1  Authorization - Number of Visits 3  PT Time Calculation  PT Start Time 2951  PT Stop Time 1530  PT Time Calculation (min) 43 min  PT - End of Session  Equipment Utilized During Treatment Gait belt  Activity Tolerance Patient tolerated treatment well;Patient limited by fatigue;Patient limited by pain  Behavior During Therapy [facility] for tasks assessed/performed;Flat affect    Past Medical History:  Diagnosis Date  . ARF (acute renal failure) ([place]) 09/2017  . Diabetes mellitus without complication ([place])   . Hypertension   . Necrotizing fasciitis ([place]) 10/13/2017  . Wound dehiscence 11/24/2017    Past Surgical History:  Procedure Laterality Date  . AMPUTATION Right 10/13/2017   Procedure: RIGHT BELOW KNEE AMPUTATION;  Surgeon: [name], MD;  Location: [facility];  Service: Orthopedics;  Laterality: Right;  . AMPUTATION Right 11/24/2017   Procedure: AMPUTATION BELOW KNEE REVISION;  Surgeon: [name], MD;   Location: [place];  Service: Orthopedics;  Laterality: Right;  . NO PAST SURGERIES      There were no vitals filed for this visit.  Subjective Assessment - 06/03/18 1450    Patient is accompained by:  Family member    Pertinent History  R TTA, DM2, HTN, peripheral neuropathy, acute renal failure    Limitations  Lifting;Standing;Walking;House hold activities    Patient Stated Goals  To use prosthesis to walk in home & community,     Currently in Pain?  Yes    Pain Score  6     Pain Location  Leg    Pain Orientation  Left    Pain Descriptors / Indicators  Throbbing;Numbness;Pins and needles    Pain Onset  More than a month ago    Pain Frequency  Constant    Aggravating Factors   unknown    Pain Relieving Factors  unknown    Multiple Pain Sites  No    Pain Score  0    Pain Location  Leg    Pain Orientation  Right           [facility] Adult PT Treatment/Exercise - 06/03/18 1700      Transfers   Transfers  Sit to Stand;Stand to Sit    Sit to Stand  5: Supervision;With upper extremity assist;From chair/3-in-1    Stand to Sit  5: Supervision;With upper extremity assist;To chair/3-in-1    Comments  needed sink

## 2018-06-06 DIAGNOSIS — E11621 Type 2 diabetes mellitus with foot ulcer: Secondary | ICD-10-CM | POA: Diagnosis not present

## 2018-06-10 ENCOUNTER — Encounter: Payer: Self-pay | Admitting: Physical Therapy

## 2018-06-10 ENCOUNTER — Ambulatory Visit: Payer: Medicaid Other | Admitting: Physical Therapy

## 2018-06-10 DIAGNOSIS — R2689 Other abnormalities of gait and mobility: Secondary | ICD-10-CM

## 2018-06-10 DIAGNOSIS — R296 Repeated falls: Secondary | ICD-10-CM

## 2018-06-10 DIAGNOSIS — M6281 Muscle weakness (generalized): Secondary | ICD-10-CM | POA: Diagnosis not present

## 2018-06-10 DIAGNOSIS — R2681 Unsteadiness on feet: Secondary | ICD-10-CM

## 2018-06-10 NOTE — Therapy (Signed)
[facility] [address] [place], [place], [address] Phone: [phone]   Fax:  [phone]  Physical Therapy Treatment  Patient Details  Name: [name] [name] MRN: [id number] Date of Birth: 1971-10-17 Referring Provider: Meridee Score, MD   Encounter Date: 06/10/2018  PT End of Session - 06/10/18 1406    Visit Number  3    Number of Visits  27    Authorization Type  Medicaid    Authorization Time Period  3 visits from 7/12 through 06/23/18    Authorization - Visit Number  2    Authorization - Number of Visits  3    PT Start Time  5956    PT Stop Time  1445    PT Time Calculation (min)  42 min    Equipment Utilized During Treatment  Gait belt    Activity Tolerance  Patient tolerated treatment well;Patient limited by fatigue;Patient limited by pain    Behavior During Therapy  [facility] for tasks assessed/performed;Flat affect       Past Medical History:  Diagnosis Date  . ARF (acute renal failure) ([facility]) 09/2017  . Diabetes mellitus without complication ([place])   . Hypertension   . Necrotizing fasciitis ([place]) 10/13/2017  . Wound dehiscence 11/24/2017    Past Surgical History:  Procedure Laterality Date  . AMPUTATION Right 10/13/2017   Procedure: RIGHT BELOW KNEE AMPUTATION;  Surgeon: [name], MD;  Location: [facility];  Service: Orthopedics;  Laterality: Right;  . AMPUTATION Right 11/24/2017   Procedure: AMPUTATION BELOW KNEE REVISION;  Surgeon: [name], MD;  Location: [facility];  Service: Orthopedics;  Laterality: Right;  . NO PAST SURGERIES      There were no vitals filed for this visit.  Subjective Assessment - 06/10/18 1404    Subjective  Pt. reports not changes and no falls since last visit.     Patient is accompained by:  Family member    Pertinent History  R TTA, DM2, HTN, peripheral neuropathy, acute renal failure    Limitations  Lifting;Standing;Walking;House hold activities    Patient Stated Goals  To  use prosthesis to walk in home & community,     Currently in Pain?  Yes    Pain Score  6     Pain Location  Foot    Pain Orientation  Left    Pain Descriptors / Indicators  Throbbing    Pain Onset  More than a month ago    Multiple Pain Sites  No           [facility] Adult PT Treatment/Exercise - 06/10/18 1519      Transfers   Transfers  Sit to Stand;Stand to Sit    Sit to Stand  5: Supervision;With upper extremity assist;From chair/3-in-1    Stand to Sit  5: Supervision;With upper extremity assist;To chair/3-in-1      Ambulation/Gait   Ambulation/Gait  Yes    Ambulation/Gait Assistance  4: Min guard;4: Min assist    Ambulation/Gait Assistance Details  pt continues to have a heavy reliance on the walker with gait. cues needed for posture, step length and weight shifting onto prosthesis in stance phase.  utilized colored bands on bottom of walker to assist with step placement during gait. cues to not get too close inside of walker with gait.                            Ambulation  [facility] [address] [place], [place], [address] Phone: [phone]   Fax:  [phone]  Physical Therapy Treatment  Patient Details  Name: [name] [name] MRN: [id number] Date of Birth: 1971-10-17 Referring Provider: Meridee Score, MD   Encounter Date: 06/10/2018  PT End of Session - 06/10/18 1406    Visit Number  3    Number of Visits  27    Authorization Type  Medicaid    Authorization Time Period  3 visits from 7/12 through 06/23/18    Authorization - Visit Number  2    Authorization - Number of Visits  3    PT Start Time  5956    PT Stop Time  1445    PT Time Calculation (min)  42 min    Equipment Utilized During Treatment  Gait belt    Activity Tolerance  Patient tolerated treatment well;Patient limited by fatigue;Patient limited by pain    Behavior During Therapy  [facility] for tasks assessed/performed;Flat affect       Past Medical History:  Diagnosis Date  . ARF (acute renal failure) ([facility]) 09/2017  . Diabetes mellitus without complication ([place])   . Hypertension   . Necrotizing fasciitis ([place]) 10/13/2017  . Wound dehiscence 11/24/2017    Past Surgical History:  Procedure Laterality Date  . AMPUTATION Right 10/13/2017   Procedure: RIGHT BELOW KNEE AMPUTATION;  Surgeon: [name], MD;  Location: [facility];  Service: Orthopedics;  Laterality: Right;  . AMPUTATION Right 11/24/2017   Procedure: AMPUTATION BELOW KNEE REVISION;  Surgeon: [name], MD;  Location: [facility];  Service: Orthopedics;  Laterality: Right;  . NO PAST SURGERIES      There were no vitals filed for this visit.  Subjective Assessment - 06/10/18 1404    Subjective  Pt. reports not changes and no falls since last visit.     Patient is accompained by:  Family member    Pertinent History  R TTA, DM2, HTN, peripheral neuropathy, acute renal failure    Limitations  Lifting;Standing;Walking;House hold activities    Patient Stated Goals  To  use prosthesis to walk in home & community,     Currently in Pain?  Yes    Pain Score  6     Pain Location  Foot    Pain Orientation  Left    Pain Descriptors / Indicators  Throbbing    Pain Onset  More than a month ago    Multiple Pain Sites  No           [facility] Adult PT Treatment/Exercise - 06/10/18 1519      Transfers   Transfers  Sit to Stand;Stand to Sit    Sit to Stand  5: Supervision;With upper extremity assist;From chair/3-in-1    Stand to Sit  5: Supervision;With upper extremity assist;To chair/3-in-1      Ambulation/Gait   Ambulation/Gait  Yes    Ambulation/Gait Assistance  4: Min guard;4: Min assist    Ambulation/Gait Assistance Details  pt continues to have a heavy reliance on the walker with gait. cues needed for posture, step length and weight shifting onto prosthesis in stance phase.  utilized colored bands on bottom of walker to assist with step placement during gait. cues to not get too close inside of walker with gait.                            Ambulation  Time  15    Period  Weeks    Status  New    Target Date  09/22/18      PT LONG TERM GOAL #7   Title  Patient reports pain increases </= 2 increments on 0-10 scale with standing & gait activities noted above LTGs.     Baseline  Patient reports bilateral lower extremity neuropathy pain ranging from 4/10 to 8/10    Time  15    Period  Weeks    Status  New    Target Date  09/22/18        Plan - 06/10/18 1513    Clinical Impression Statement  Today's session focused on working on prosthetic gait with a RW and on reinforcing the importance of complying with the prosthetic wear schedule provided to him. Pt. was bottoming out in his socket and feeling increased discomfort on his patella, so a 5-ply sock was added to correct this and relieved the discomfort. The pt. reported that there was a little relief but not that much change. The importance of complying with the daily wear schedule was reinforced to both the patient and his mother. Pt. would benefit from continued PT to  address remaining unmet goals and to continue working toward regaining functional mobility.     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, left foot wounds with neuropathy    PT Frequency  2x / week 1x/wk for 3 weeks, then 2x/wk for 12 weeks    PT Duration  Other (comment) 15 weeks total for 27 visits permitted with Medicaid    PT Treatment/Interventions  ADLs/Self Care Home Management;Canalith Repostioning;DME Instruction;Gait training;Stair training;Functional mobility training;Therapeutic activities;Therapeutic exercise;Balance training;Neuromuscular re-education;Patient/family education;Prosthetic Training;Orthotic Fit/Training;Manual techniques;Vestibular    PT Next Visit Plan  review prosthetic care, prosthetic gait training with RW (introduce stairs, ramp, and curb?)    Consulted and Agree with Plan of Care  Patient;Family member/caregiver    Family Member Consulted  mother       Patient will benefit from skilled therapeutic intervention in order to improve the following deficits and impairments:  Abnormal gait, Decreased activity tolerance, Decreased balance, Decreased coordination, Decreased endurance, Decreased knowledge of use of DME, Decreased mobility, Decreased range of motion, Decreased skin integrity, Decreased strength, Dizziness, Difficulty walking, Increased edema, Impaired flexibility, Impaired sensation, Postural dysfunction, Pain, Prosthetic Dependency  Visit Diagnosis: Muscle weakness (generalized)  Unsteadiness on feet  Other abnormalities of gait and mobility  Repeated falls     Problem List Patient Active Problem List   Diagnosis Date Noted  . 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 Non-pressure chronic ulcer of other part of left foot limited to breakdown of skin ([place]) 02/02/2018  . 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 History of right below knee amputation  ([facility]) 12/22/2017  . Fever   . Acute posthemorrhagic anemia   . Leukocytosis   . Falls 11/24/2017  . Benign essential HTN 11/24/2017  . Normocytic normochromic anemia 11/24/2017  . Sepsis ([place]) 10/11/2017  . Uncontrolled type 2 diabetes mellitus with hyperglycemia ([place]) 10/11/2017  . ARF (acute renal failure) ([place]) 10/11/2017  . Nausea & vomiting 10/11/2017    Arthor Captain, SPTA 06/10/2018, 3:31 PM  [facility] [address] [place], [place], [address] Phone: [phone]   Fax:  [phone]  Name: [name] [name] MRN: [id number] Date of Birth: 25-Sep-1971  Time  15    Period  Weeks    Status  New    Target Date  09/22/18      PT LONG TERM GOAL #7   Title  Patient reports pain increases </= 2 increments on 0-10 scale with standing & gait activities noted above LTGs.     Baseline  Patient reports bilateral lower extremity neuropathy pain ranging from 4/10 to 8/10    Time  15    Period  Weeks    Status  New    Target Date  09/22/18        Plan - 06/10/18 1513    Clinical Impression Statement  Today's session focused on working on prosthetic gait with a RW and on reinforcing the importance of complying with the prosthetic wear schedule provided to him. Pt. was bottoming out in his socket and feeling increased discomfort on his patella, so a 5-ply sock was added to correct this and relieved the discomfort. The pt. reported that there was a little relief but not that much change. The importance of complying with the daily wear schedule was reinforced to both the patient and his mother. Pt. would benefit from continued PT to  address remaining unmet goals and to continue working toward regaining functional mobility.     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, left foot wounds with neuropathy    PT Frequency  2x / week 1x/wk for 3 weeks, then 2x/wk for 12 weeks    PT Duration  Other (comment) 15 weeks total for 27 visits permitted with Medicaid    PT Treatment/Interventions  ADLs/Self Care Home Management;Canalith Repostioning;DME Instruction;Gait training;Stair training;Functional mobility training;Therapeutic activities;Therapeutic exercise;Balance training;Neuromuscular re-education;Patient/family education;Prosthetic Training;Orthotic Fit/Training;Manual techniques;Vestibular    PT Next Visit Plan  review prosthetic care, prosthetic gait training with RW (introduce stairs, ramp, and curb?)    Consulted and Agree with Plan of Care  Patient;Family member/caregiver    Family Member Consulted  mother       Patient will benefit from skilled therapeutic intervention in order to improve the following deficits and impairments:  Abnormal gait, Decreased activity tolerance, Decreased balance, Decreased coordination, Decreased endurance, Decreased knowledge of use of DME, Decreased mobility, Decreased range of motion, Decreased skin integrity, Decreased strength, Dizziness, Difficulty walking, Increased edema, Impaired flexibility, Impaired sensation, Postural dysfunction, Pain, Prosthetic Dependency  Visit Diagnosis: Muscle weakness (generalized)  Unsteadiness on feet  Other abnormalities of gait and mobility  Repeated falls     Problem List Patient Active Problem List   Diagnosis Date Noted  . 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 Non-pressure chronic ulcer of other part of left foot limited to breakdown of skin ([place]) 02/02/2018  . 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 History of right below knee amputation  ([facility]) 12/22/2017  . Fever   . Acute posthemorrhagic anemia   . Leukocytosis   . Falls 11/24/2017  . Benign essential HTN 11/24/2017  . Normocytic normochromic anemia 11/24/2017  . Sepsis ([place]) 10/11/2017  . Uncontrolled type 2 diabetes mellitus with hyperglycemia ([place]) 10/11/2017  . ARF (acute renal failure) ([place]) 10/11/2017  . Nausea & vomiting 10/11/2017    Arthor Captain, SPTA 06/10/2018, 3:31 PM  [facility] [address] [place], [place], [address] Phone: [phone]   Fax:  [phone]  Name: [name] [name] MRN: [id number] Date of Birth: 25-Sep-1971

## 2018-06-17 ENCOUNTER — Encounter: Payer: Self-pay | Admitting: Physical Therapy

## 2018-06-17 ENCOUNTER — Ambulatory Visit: Payer: Medicaid Other | Admitting: Physical Therapy

## 2018-06-17 DIAGNOSIS — R2681 Unsteadiness on feet: Secondary | ICD-10-CM

## 2018-06-17 DIAGNOSIS — M6281 Muscle weakness (generalized): Secondary | ICD-10-CM | POA: Diagnosis not present

## 2018-06-17 DIAGNOSIS — R296 Repeated falls: Secondary | ICD-10-CM

## 2018-06-17 DIAGNOSIS — R2689 Other abnormalities of gait and mobility: Secondary | ICD-10-CM

## 2018-06-17 NOTE — Therapy (Signed)
[facility] [address] [place], [place], [address] Phone: [phone]   Fax:  [phone]  Physical Therapy Treatment  Patient Details  Name: [name] [name] MRN: [id number] Date of Birth: 1971-11-16 Referring Provider: Meridee Score, MD   Encounter Date: 06/17/2018  PT End of Session - 06/17/18 1405    Visit Number  4    Number of Visits  27    Authorization Type  Medicaid    Authorization Time Period  3 visits from 7/12 through 06/23/18    Authorization - Visit Number  3    Authorization - Number of Visits  3    PT Start Time  1402    PT Stop Time  1447    PT Time Calculation (min)  45 min    Equipment Utilized During Treatment  Gait belt    Activity Tolerance  Patient tolerated treatment well;Patient limited by fatigue;Patient limited by pain    Behavior During Therapy  [facility] for tasks assessed/performed;Flat affect       Past Medical History:  Diagnosis Date  . ARF (acute renal failure) ([place]) 09/2017  . Diabetes mellitus without complication ([place])   . Hypertension   . Necrotizing fasciitis ([place]) 10/13/2017  . Wound dehiscence 11/24/2017    Past Surgical History:  Procedure Laterality Date  . AMPUTATION Right 10/13/2017   Procedure: RIGHT BELOW KNEE AMPUTATION;  Surgeon: [name], MD;  Location: [facility];  Service: Orthopedics;  Laterality: Right;  . AMPUTATION Right 11/24/2017   Procedure: AMPUTATION BELOW KNEE REVISION;  Surgeon: [name], MD;  Location: [place];  Service: Orthopedics;  Laterality: Right;  . NO PAST SURGERIES      There were no vitals filed for this visit.  Subjective Assessment - 06/17/18 1404    Subjective  No falls. Does have some left foot pain, is now in regular sneakers on this foot (vs post op shoe).    Patient is accompained by:  Family member    Pertinent History  R TTA, DM2, HTN, peripheral neuropathy, acute renal failure    Limitations  Lifting;Standing;Walking;House  hold activities    Patient Stated Goals  To use prosthesis to walk in home & community,     Currently in Pain?  Yes    Pain Score  5     Pain Location  Foot    Pain Orientation  Left    Pain Descriptors / Indicators  Burning;Throbbing    Pain Type  Chronic pain;Neuropathic pain wound pain    Pain Onset  More than a month ago    Pain Frequency  Constant    Aggravating Factors   weight bearing on it.     Pain Relieving Factors  unknown           [facility] Adult PT Treatment/Exercise - 06/17/18 1417      Transfers   Transfers  Sit to Stand;Stand to Sit    Sit to Stand  5: Supervision;With upper extremity assist;From chair/3-in-1    Sit to Stand Details (indicate cue type and reason)  needs RW to stabilize on with standing    Stand to Sit  5: Supervision;With upper extremity assist;To chair/3-in-1    Stand to Sit Details  needs RW support for stability with sitting down      Ambulation/Gait   Ambulation/Gait  Yes    Ambulation/Gait Assistance  4: Min guard;4: Min assist    Ambulation/Gait Assistance Details  cues on posure, step length,  weight shifting onto prosthesis and walker position with gait.     Ambulation Distance (Feet)  45 Feet x1, 40 x1, 50 x1    Assistive device  Rolling walker;Prosthesis    Gait Pattern  Step-to pattern;Decreased step length - left;Decreased stance time - right;Decreased stride length;Decreased dorsiflexion - left;Decreased hip/knee flexion - right;Decreased weight shift to right;Right hip hike;Left steppage;Left foot flat;Right flexed knee in stance;Left flexed knee in stance;Antalgic;Lateral hip instability;Trunk flexed;Abducted- right;Poor foot clearance - left;Poor foot clearance - right    Ambulation Surface  Level;Indoor      Prosthetics   Prosthetic Care Comments   with RW support: pt is able to reach 4 inches forward out of his base of support, and reach all the way to his feet and up without any balance issues.  continued to reinforce increased wear  each day (pt is now wearing the prosthesis every day) and continued to provide education on sock ply adjustment     Current prosthetic wear tolerance (days/week)   daily    Current prosthetic wear tolerance (#hours/day)   2 hours a day, working on wearing it a second time for 2 hours    Residual limb condition   intact with no issues, dry skin present along with some redness at patella area. edcuated pt on use of baby oil over patella/upper thigh to assist with decreased friction from liner    Education Provided  Residual limb care;Correct ply sock adjustment;Proper wear schedule/adjustment;Proper weight-bearing schedule/adjustment    Person(s) Educated  Patient;Parent(s) mom    Education Method  Explanation;Demonstration;Verbal cues    Education Method  Verbalized understanding;Tactile cues required;Verbal cues required;Needs further instruction    Donning Prosthesis  Supervision    Doffing Prosthesis  Supervision           PT Short Term Goals - 06/17/18 1407      PT SHORT TERM GOAL #1   Title  Patient donnes prosthesis modified independent and verbalizes proper cleaning of prosthesis.     Baseline  06/17/18: met today    Status  Achieved      PT SHORT TERM GOAL #2   Title  Patient tolerates wear >6 hours total / day >/= 5 days/wk without increase in skin issues.     Baseline  06/17/18: wearing every day for 2 hours, working toward a second wear of 2 hours    Status  Achieved      PT SHORT TERM GOAL #3   Title  Patient standing balance with rolling walker & prosthesis: reaches 10" anterior to RW & to floor with supervision.     Baseline  06/17/18: met today with RW support    Time  --    Period  --    Status  Achieved      PT SHORT TERM GOAL #4   Title  Patient ambulates 43' with rolling walker & prosthesis with minimal assist.     Baseline  06/17/18: met today    Time  --    Period  --    Status  Achieved      PT SHORT TERM GOAL #5   Title  Patient demonstrates understanding  of initial HEP standing at sink for support.     Baseline  06/17/18; pt is not independent with sink HEP at home for balance/proprioception    Status  Achieved        PT Long Term Goals - 05/19/18 2037      PT LONG TERM GOAL #1  Title  Patient verbalizes & demonstrates understanding of proper prosthetic care to enable safe use of prosthesis.    Baseline  Patient is dependent in prosthetic care & requires minimal assist to donne. He has 2 wounds on residual limb.     Time  15    Period  Weeks    Status  New    Target Date  09/22/18      PT LONG TERM GOAL #2   Title  Patient tolerates daily prothesis wear >90% of awake hours without skin or limb pain issues.     Baseline  Patient has worn prosthesis 3 times since prosthesis delivery for 2 hours or less.     Time  15    Period  Weeks    Status  New    Target Date  09/22/18      PT LONG TERM GOAL #3   Title  Patient performs standing balance with rolling walker & prosthesis reaching 10" anteriorly, to floor, manages clothes for toileting, modified independent; task within [place] test with RW support >36/56 safely.     Baseline  tasks in [place] test with RW support 20/56, reaches 4" anterior to RW with minA, reaches 10" from floor with minA.    Time  15    Period  Weeks    Status  New    Target Date  09/22/18      PT LONG TERM GOAL #4   Title  Patient ambulates 300' with RW & prosthesis modified independent to enable basic community mobility.     Baseline  Patient ambulates 36' with RW & prosthesis with moderate assist.     Time  15    Period  Weeks    Status  New    Target Date  09/22/18      PT LONG TERM GOAL #5   Title  Patient negotiates ramps, curbs with rolling walker and stairs with 1 rail & cane with prosthesis modified independent for community access.     Baseline  Patient is totally dependent in negotiating barriers with prosthetic gait.     Time  15    Period  Weeks    Status  New    Target Date  09/22/18       Additional Long Term Goals   Additional Long Term Goals  Yes      PT LONG TERM GOAL #6   Title  Patient verbalizes understanding of fitness plan.     Baseline  Patient is dependent in appropriate exercises with medical issues.     Time  15    Period  Weeks    Status  New    Target Date  09/22/18      PT LONG TERM GOAL #7   Title  Patient reports pain increases </= 2 increments on 0-10 scale with standing & gait activities noted above LTGs.     Baseline  Patient reports bilateral lower extremity neuropathy pain ranging from 4/10 to 8/10    Time  15    Period  Weeks    Status  New    Target Date  09/22/18            Plan - 06/17/18 1406    Clinical Impression Statement  Today's skilled session focused on progress toward STGs with all goals met. Pt has progressed from not wearing the liner/prosthesis to wearing it every day for 2 hours and is working to increase his wear time to 4 hours a  day (2 hours 2x a day). He has progressed to only needing supervision for sit/stand transfers and min assist for gait with max distance to date 50 feet (was 30 feet at evaluation). He is progressing well and will benefit from continued PT to progress toward his LTGs.                     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, left foot wounds with neuropathy    PT Frequency  2x / week 1x/wk for 3 weeks, then 2x/wk for 12 weeks    PT Duration  Other (comment) 15 weeks total for 27 visits permitted with Medicaid    PT Treatment/Interventions  ADLs/Self Care Home Management;Canalith Repostioning;DME Instruction;Gait training;Stair training;Functional mobility training;Therapeutic activities;Therapeutic exercise;Balance training;Neuromuscular re-education;Patient/family education;Prosthetic Training;Orthotic Fit/Training;Manual techniques;Vestibular    PT Next Visit Plan  work on simulated shower transfer as pt reports not  being able to get into his shower for months and wants too, stair instruction, ramp/curbs as well. Inquire how walking with mom at home is going (was advised to start working on short distances at today's session). Pt is to call [facility] an set up appt with Chris-see if he has done this.     Consulted and Agree with Plan of Care  Patient;Family member/caregiver    Family Member Consulted  mother       Patient will benefit from skilled therapeutic intervention in order to improve the following deficits and impairments:  Abnormal gait, Decreased activity tolerance, Decreased balance, Decreased coordination, Decreased endurance, Decreased knowledge of use of DME, Decreased mobility, Decreased range of motion, Decreased skin integrity, Decreased strength, Dizziness, Difficulty walking, Increased edema, Impaired flexibility, Impaired sensation, Postural dysfunction, Pain, Prosthetic Dependency  Visit Diagnosis: Muscle weakness (generalized)  Unsteadiness on feet  Other abnormalities of gait and mobility  Repeated falls     Problem List Patient Active Problem List   Diagnosis Date Noted  . 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 Non-pressure chronic ulcer of other part of left foot limited to breakdown of skin ([place]) 02/02/2018  . 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 History of right below knee amputation ([place]) 12/22/2017  . Fever   . Acute posthemorrhagic anemia   . Leukocytosis   . Falls 11/24/2017  . Benign essential HTN 11/24/2017  . Normocytic normochromic anemia 11/24/2017  . Sepsis ([place]) 10/11/2017  . Uncontrolled type 2 diabetes mellitus with hyperglycemia ([place]) 10/11/2017  . ARF (acute renal failure) ([place]) 10/11/2017  . Nausea & vomiting 10/11/2017    Willow Ora, PTA, [facility] Outpatient Neuro [facility] [address] [place], [place] [address] [phone] 06/17/18, 4:14 PM   Name: [name] [name] MRN: [id number] Date of Birth:  January 29, 1971

## 2018-06-22 ENCOUNTER — Ambulatory Visit: Payer: Medicaid Other | Admitting: Physical Therapy

## 2018-06-24 ENCOUNTER — Encounter: Payer: Self-pay | Admitting: Physical Therapy

## 2018-06-24 ENCOUNTER — Ambulatory Visit: Payer: Medicaid Other | Attending: Internal Medicine | Admitting: Physical Therapy

## 2018-06-24 ENCOUNTER — Telehealth: Payer: Self-pay | Admitting: Physical Therapy

## 2018-06-24 DIAGNOSIS — R296 Repeated falls: Secondary | ICD-10-CM | POA: Diagnosis present

## 2018-06-24 DIAGNOSIS — R293 Abnormal posture: Secondary | ICD-10-CM | POA: Insufficient documentation

## 2018-06-24 DIAGNOSIS — M6281 Muscle weakness (generalized): Secondary | ICD-10-CM | POA: Diagnosis present

## 2018-06-24 DIAGNOSIS — R2681 Unsteadiness on feet: Secondary | ICD-10-CM | POA: Insufficient documentation

## 2018-06-24 DIAGNOSIS — R2689 Other abnormalities of gait and mobility: Secondary | ICD-10-CM | POA: Insufficient documentation

## 2018-06-24 NOTE — Therapy (Signed)
[facility] [address] [place], [place], [address] Phone: [phone]   Fax:  [phone]  Physical Therapy Treatment  Patient Details  Name: [name] [name] MRN: [id number] Date of Birth: 18-Nov-1971 Referring Provider: Aldean Baker, MD   Encounter Date: 06/24/2018  PT End of Session - 06/24/18 1604    Visit Number  5    Number of Visits  27    Authorization Type  Medicaid    Authorization Time Period  3 visits from 7/12 through 06/23/18; 12 additional visits authorized from 06/24/18- 08/04/18.    Authorization - Visit Number  4    Authorization - Number of Visits  15    PT Start Time  1405    PT Stop Time  1449    PT Time Calculation (min)  44 min    Equipment Utilized During Treatment  Gait belt    Activity Tolerance  Patient tolerated treatment well;Patient limited by fatigue    Behavior During Therapy  WFL for tasks assessed/performed       Past Medical History:  Diagnosis Date  . ARF (acute renal failure) ([facility]) 09/2017  . Diabetes mellitus without complication ([facility])   . Hypertension   . Necrotizing fasciitis ([facility]) 10/13/2017  . Wound dehiscence 11/24/2017    Past Surgical History:  Procedure Laterality Date  . AMPUTATION Right 10/13/2017   Procedure: RIGHT BELOW KNEE AMPUTATION;  Surgeon: Nadara Mustard, MD;  Location: [facility];  Service: Orthopedics;  Laterality: Right;  . AMPUTATION Right 11/24/2017   Procedure: AMPUTATION BELOW KNEE REVISION;  Surgeon: Nadara Mustard, MD;  Location: [facility] OR;  Service: Orthopedics;  Laterality: Right;  . NO PAST SURGERIES      There were no vitals filed for this visit.  Subjective Assessment - 06/24/18 1408    Subjective  No falls. No pain at this time- reports it comes and goes. Has has some near falls, always backwards and usually with standing.     Patient is accompained by:  Family member mom in lobby    Pertinent History  R TTA, DM2, HTN, peripheral neuropathy, acute  renal failure    Limitations  Lifting;Standing;Walking;House hold activities    Patient Stated Goals  To use prosthesis to walk in home & community,     Currently in Pain?  No/denies    Pain Score  0-No pain           [facility] Adult PT Treatment/Exercise - 06/24/18 1409      Transfers   Transfers  Sit to Stand;Stand to Sit    Sit to Stand  5: Supervision;With upper extremity assist;From chair/3-in-1;4: Min guard    Sit to Stand Details (indicate cue type and reason)  min guard from tub chair and shower transfer bench for safety, otherwise pt was supervision. does need RW to stabilize on upon standing.     Stand to Sit  5: Supervision;With upper extremity assist;To chair/3-in-1    Stand to Sit Details  cues to reach back for safety with sitting and for controlled desent. min guard assist with sitting to tub chair and shower transfer bench , otherwise supervision needed.       Ambulation/Gait   Ambulation/Gait  Yes    Ambulation/Gait Assistance  4: Min guard    Ambulation/Gait Assistance Details  cues on posture, step placement, and step length.     Ambulation Distance (Feet)  20 Feet x2    Assistive device  Rolling walker;Prosthesis  Gait Pattern  Step-to pattern;Decreased step length - left;Decreased stance time - right;Decreased stride length;Decreased dorsiflexion - left;Decreased hip/knee flexion - right;Decreased weight shift to right;Right hip hike;Left steppage;Left foot flat;Right flexed knee in stance;Left flexed knee in stance;Antalgic;Lateral hip instability;Trunk flexed;Abducted- right;Poor foot clearance - left;Poor foot clearance - right    Ambulation Surface  Level;Indoor    Stairs  Yes    Stairs Assistance  4: Min guard;4: Min assist    Stairs Assistance Details (indicate cue type and reason)  PTA demo'd technique/sequence prior to pt performance. cues needed during pt performance for weight shifting , sequencing and to advance hands on rails.                     Stair  Management Technique  Two rails;Step to pattern;Forwards    Number of Stairs  4      Therapeutic Activites    Therapeutic Activities  ADL's    ADL's  simulated shower transfer: using shower chair, then using transfer shower bench. PTA demo'd technique for getting in/out of simulated tub with each one, then had pt practice with approaching tub with RW, sitting down, lifting legs into tub and out, standing back up from surface. Increased ease and safety noted with shower transfer bench vs chair. Confirmed with social worker at hospital that Medicaid will cover this. Will submit request to MD for shower transfer bench.       Prosthetics   Prosthetic Care Comments   encouraged pt to increase wear time to all awake hours at this time to promote more independance at home and increase tolerance to prosthesis.     Current prosthetic wear tolerance (days/week)   daily    Current prosthetic wear tolerance (#hours/day)   at least 2 hours, sometimes more    Residual limb condition   intact. does have redness in popliteal area from where the shrinker keeps sliding down/bunching up behind his knee. Saw Megan at [facility]: she tried to adjust fit of socket wiht pads and was unable to find a comfortable. she did adjust the height. she did not address the shrinker issue.                    Education Provided  Residual limb care;Proper wear schedule/adjustment;Proper weight-bearing schedule/adjustment    Person(s) Educated  Patient    Education Method  Explanation;Demonstration;Verbal cues    Education Method  Verbalized understanding;Tactile cues required;Verbal cues required    Donning Prosthesis  Supervision    Doffing Prosthesis  Supervision          PT Short Term Goals - 06/17/18 1407      PT SHORT TERM GOAL #1   Title  Patient donnes prosthesis modified independent and verbalizes proper cleaning of prosthesis.     Baseline  06/17/18: met today    Status  Achieved      PT SHORT TERM GOAL #2   Title   Patient tolerates wear >6 hours total / day >/= 5 days/wk without increase in skin issues.     Baseline  06/17/18: wearing every day for 2 hours, working toward a second wear of 2 hours    Status  Achieved      PT SHORT TERM GOAL #3   Title  Patient standing balance with rolling walker & prosthesis: reaches 10" anterior to RW & to floor with supervision.     Baseline  06/17/18: met today with RW support    Time  --  Period  --    Status  Achieved      PT SHORT TERM GOAL #4   Title  Patient ambulates 19' with rolling walker & prosthesis with minimal assist.     Baseline  06/17/18: met today    Time  --    Period  --    Status  Achieved      PT SHORT TERM GOAL #5   Title  Patient demonstrates understanding of initial HEP standing at sink for support.     Baseline  06/17/18; pt is not independent with sink HEP at home for balance/proprioception    Status  Achieved        PT Long Term Goals - 05/19/18 2037      PT LONG TERM GOAL #1   Title  Patient verbalizes & demonstrates understanding of proper prosthetic care to enable safe use of prosthesis.    Baseline  Patient is dependent in prosthetic care & requires minimal assist to donne. He has 2 wounds on residual limb.     Time  15    Period  Weeks    Status  New    Target Date  09/22/18      PT LONG TERM GOAL #2   Title  Patient tolerates daily prothesis wear >90% of awake hours without skin or limb pain issues.     Baseline  Patient has worn prosthesis 3 times since prosthesis delivery for 2 hours or less.     Time  15    Period  Weeks    Status  New    Target Date  09/22/18      PT LONG TERM GOAL #3   Title  Patient performs standing balance with rolling walker & prosthesis reaching 10" anteriorly, to floor, manages clothes for toileting, modified independent; task within [place] test with RW support >36/56 safely.     Baseline  tasks in [place] test with RW support 20/56, reaches 4" anterior to RW with minA, reaches 10" from floor  with minA.    Time  15    Period  Weeks    Status  New    Target Date  09/22/18      PT LONG TERM GOAL #4   Title  Patient ambulates 300' with RW & prosthesis modified independent to enable basic community mobility.     Baseline  Patient ambulates 13' with RW & prosthesis with moderate assist.     Time  15    Period  Weeks    Status  New    Target Date  09/22/18      PT LONG TERM GOAL #5   Title  Patient negotiates ramps, curbs with rolling walker and stairs with 1 rail & cane with prosthesis modified independent for community access.     Baseline  Patient is totally dependent in negotiating barriers with prosthetic gait.     Time  15    Period  Weeks    Status  New    Target Date  09/22/18      Additional Long Term Goals   Additional Long Term Goals  Yes      PT LONG TERM GOAL #6   Title  Patient verbalizes understanding of fitness plan.     Baseline  Patient is dependent in appropriate exercises with medical issues.     Time  15    Period  Weeks    Status  New    Target Date  09/22/18  PT LONG TERM GOAL #7   Title  Patient reports pain increases </= 2 increments on 0-10 scale with standing & gait activities noted above LTGs.     Baseline  Patient reports bilateral lower extremity neuropathy pain ranging from 4/10 to 8/10    Time  15    Period  Weeks    Status  New    Target Date  09/22/18            Plan - 06/24/18 1606    Clinical Impression Statement  Today's skilled session continued to focus on incorportating use of prosthesis into daily functions. Went over and practiced shower transfers as pt reports he has not been able to shower since the amputation and wanted to get back into the shower. Pt safest with shower transfer bench at this time. Will request an order from his MD for him to get one for use at home. Remainder of session continued to address gait with RW/prosthesis with less cues/assistance needed. Also introduced stairs today with min guard to  min assist needed for balance/technique. Pt is making steady progress and should benefit from continued PT to progress toward unmet goals.    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, left foot wounds with neuropathy    PT Frequency  2x / week 1x/wk for 3 weeks, then 2x/wk for 12 weeks    PT Duration  Other (comment) 15 weeks total for 27 visits permitted with Medicaid    PT Treatment/Interventions  ADLs/Self Care Home Management;Canalith Repostioning;DME Instruction;Gait training;Stair training;Functional mobility training;Therapeutic activities;Therapeutic exercise;Balance training;Neuromuscular re-education;Patient/family education;Prosthetic Training;Orthotic Fit/Training;Manual techniques;Vestibular    PT Next Visit Plan  continue with stair instruction, initiate ramp/curbs as well. see if order for shower transfer bench has come in. Call Thayer Ohm with [facility] to set up appt for prosthetic adjustments.     Consulted and Agree with Plan of Care  Patient;Family member/caregiver    Family Member Consulted  mother       Patient will benefit from skilled therapeutic intervention in order to improve the following deficits and impairments:  Abnormal gait, Decreased activity tolerance, Decreased balance, Decreased coordination, Decreased endurance, Decreased knowledge of use of DME, Decreased mobility, Decreased range of motion, Decreased skin integrity, Decreased strength, Dizziness, Difficulty walking, Increased edema, Impaired flexibility, Impaired sensation, Postural dysfunction, Pain, Prosthetic Dependency  Visit Diagnosis: Muscle weakness (generalized)  Unsteadiness on feet  Other abnormalities of gait and mobility  Repeated falls     Problem List Patient Active Problem List   Diagnosis Date Noted  . 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 Non-pressure chronic ulcer of other part of left foot limited to  breakdown of skin ([facility]) 02/02/2018  . 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 History of right below knee amputation ([facility]) 12/22/2017  . Fever   . Acute posthemorrhagic anemia   . Leukocytosis   . Falls 11/24/2017  . Benign essential HTN 11/24/2017  . Normocytic normochromic anemia 11/24/2017  . Sepsis ([facility]) 10/11/2017  . Uncontrolled type 2 diabetes mellitus with hyperglycemia ([facility]) 10/11/2017  . ARF (acute renal failure) ([facility]) 10/11/2017  . Nausea & vomiting 10/11/2017    Sallyanne Kuster, PTA, [facility] Outpatient Neuro [facility] [address], [address] [place], [place] [address] [phone] 06/24/18, 4:10 PM   Name: [name] [name] MRN: [id number] Date of Birth: 08-01-1971

## 2018-06-24 NOTE — Telephone Encounter (Signed)
Dr. Sharol Given,  [name] [name] is being treated by physical therapy for prosthetic training s/p right BKA.  He will benefit from use of a shower transfer bench in order to improve safety with functional mobility in the bathroom with bathing.  If you agree, please submit request in EPIC under referral for DME shower transfer bench or fax to [facility] at [phone].   Thank you,  Willow Ora, PTA, [facility] [address] [place], [place] [address] [phone] 06/24/18, [facility] [address] [place], [place]  [address] Phone:  [phone] Fax:  [phone]

## 2018-06-25 ENCOUNTER — Other Ambulatory Visit (INDEPENDENT_AMBULATORY_CARE_PROVIDER_SITE_OTHER): Payer: Self-pay | Admitting: Orthopedic Surgery

## 2018-06-25 DIAGNOSIS — Z89519 Acquired absence of unspecified leg below knee: Secondary | ICD-10-CM

## 2018-06-27 ENCOUNTER — Other Ambulatory Visit (INDEPENDENT_AMBULATORY_CARE_PROVIDER_SITE_OTHER): Payer: Self-pay | Admitting: Orthopedic Surgery

## 2018-06-27 DIAGNOSIS — Z89519 Acquired absence of unspecified leg below knee: Secondary | ICD-10-CM

## 2018-06-28 ENCOUNTER — Ambulatory Visit: Payer: Medicaid Other | Admitting: Physical Therapy

## 2018-06-28 ENCOUNTER — Other Ambulatory Visit: Payer: Self-pay | Admitting: Internal Medicine

## 2018-07-01 ENCOUNTER — Ambulatory Visit: Payer: Medicaid Other | Admitting: Physical Therapy

## 2018-07-04 ENCOUNTER — Ambulatory Visit: Payer: Medicaid Other | Admitting: Physical Therapy

## 2018-07-04 ENCOUNTER — Encounter: Payer: Self-pay | Admitting: Physical Therapy

## 2018-07-04 DIAGNOSIS — R293 Abnormal posture: Secondary | ICD-10-CM

## 2018-07-04 DIAGNOSIS — M6281 Muscle weakness (generalized): Secondary | ICD-10-CM | POA: Diagnosis not present

## 2018-07-04 DIAGNOSIS — R2689 Other abnormalities of gait and mobility: Secondary | ICD-10-CM

## 2018-07-04 DIAGNOSIS — R2681 Unsteadiness on feet: Secondary | ICD-10-CM

## 2018-07-05 ENCOUNTER — Ambulatory Visit: Payer: Medicaid Other | Admitting: Physical Therapy

## 2018-07-05 ENCOUNTER — Encounter: Payer: Self-pay | Admitting: Physical Therapy

## 2018-07-05 DIAGNOSIS — R293 Abnormal posture: Secondary | ICD-10-CM

## 2018-07-05 DIAGNOSIS — R2681 Unsteadiness on feet: Secondary | ICD-10-CM

## 2018-07-05 DIAGNOSIS — M6281 Muscle weakness (generalized): Secondary | ICD-10-CM | POA: Diagnosis not present

## 2018-07-05 DIAGNOSIS — R2689 Other abnormalities of gait and mobility: Secondary | ICD-10-CM

## 2018-07-05 NOTE — Therapy (Signed)
[facility] [address] [place], [place], [address] Phone: [phone]   Fax:  [phone]  Physical Therapy Treatment  Patient Details  Name: [name] [name] MRN: [id number] Date of Birth: 1971-11-14 Referring Provider: Meridee Score, MD   Encounter Date: 07/04/2018  PT End of Session - 07/04/18 1500    Visit Number  6    Number of Visits  27    Authorization Type  Medicaid    Authorization Time Period  3 visits from 7/12 through 06/23/18; 12 additional visits authorized from 06/24/18- 08/04/18.    Authorization - Visit Number  5    Authorization - Number of Visits  15    PT Start Time  2202    PT Stop Time  1450    PT Time Calculation (min)  45 min    Equipment Utilized During Treatment  Gait belt    Activity Tolerance  Patient tolerated treatment well;Patient limited by fatigue    Behavior During Therapy  WFL for tasks assessed/performed       Past Medical History:  Diagnosis Date  . ARF (acute renal failure) ([facility]) 09/2017  . Diabetes mellitus without complication ([facility])   . Hypertension   . Necrotizing fasciitis ([facility]) 10/13/2017  . Wound dehiscence 11/24/2017    Past Surgical History:  Procedure Laterality Date  . AMPUTATION Right 10/13/2017   Procedure: RIGHT BELOW KNEE AMPUTATION;  Surgeon: [name], MD;  Location: [facility];  Service: Orthopedics;  Laterality: Right;  . AMPUTATION Right 11/24/2017   Procedure: AMPUTATION BELOW KNEE REVISION;  Surgeon: [name], MD;  Location: [facility];  Service: Orthopedics;  Laterality: Right;  . NO PAST SURGERIES      There were no vitals filed for this visit.  Subjective Assessment - 07/04/18 1405    Subjective  He is wearing prosthesis most of awake hours. He donnes ~1 hr after arising, takes it off for 2-3 hr nap midday and wears 2nd time until 1-2 hrs before bed.     Patient is accompained by:  Family member   mom in lobby   Pertinent History  R TTA, DM2, HTN,  peripheral neuropathy, acute renal failure    Limitations  Lifting;Standing;Walking;House hold activities    Patient Stated Goals  To use prosthesis to walk in home & community,     Currently in Pain?  No/denies                       [facility] Adult PT Treatment/Exercise - 07/04/18 1400      Transfers   Transfers  Sit to Stand;Stand to Sit    Sit to Stand  5: Supervision;With upper extremity assist;With armrests;From chair/3-in-1   to RW for stabilization   Stand to Sit  5: Supervision;With upper extremity assist;With armrests;To chair/3-in-1   from [place] for stability     Ambulation/Gait   Ambulation/Gait  Yes    Ambulation/Gait Assistance  5: Supervision    Ambulation/Gait Assistance Details  verbal cues on step through with equal step length, upright posture    Ambulation Distance (Feet)  65 Feet   65' X 2   Assistive device  Rolling walker;Prosthesis    Gait Pattern  Decreased step length - left;Decreased stance time - right;Decreased stride length;Decreased dorsiflexion - left;Decreased hip/knee flexion - right;Decreased weight shift to right;Right hip hike;Left steppage;Left foot flat;Right flexed knee in stance;Left flexed knee in stance;Antalgic;Lateral hip instability;Trunk flexed;Abducted- right;Poor foot clearance - left;Poor  foot clearance - right;Step-through pattern    Ambulation Surface  Indoor;Level    Stairs  Yes    Stairs Assistance  5: Supervision    Stairs Assistance Details (indicate cue type and reason)  verbal cues on prosthetic foot position ascend needs to externally rotate due to size of his foot, sequence    Stair Management Technique  Two rails;Step to pattern;Forwards    Number of Stairs  4    Ramp  4: Min assist   RW & TTA prosthesis   Ramp Details (indicate cue type and reason)  verbal cues & demo on technique with upright posture & wt shift    Curb  4: Min assist   RW & prosthesis   Curb Details (indicate cue type and reason)  verbal cues on  sequence and step position      Therapeutic Activites    Therapeutic Activities  --    ADL's  --      Prosthetics   Prosthetic Care Comments   use of baby oil on patella proximally to decrease friction rub,  prosthesis is 3/8" short with PT using plates to determine level pelvis.     Current prosthetic wear tolerance (days/week)   daily    Current prosthetic wear tolerance (#hours/day)   ~7-9 hours between 2 times over weekend     Residual limb condition   intact. does have redness in popliteal area from where the shrinker keeps sliding down/bunching up behind his knee. Saw Megan at [employer]: she tried to adjust fit of socket wiht pads and was unable to find a comfortable. she did adjust the height. she did not address the shrinker issue.                    Education Provided  Residual limb care;Proper wear schedule/adjustment;Proper weight-bearing schedule/adjustment    Person(s) Educated  Patient    Education Method  Explanation;Verbal cues;Demonstration    Education Method  Verbalized understanding;Verbal cues required;Needs further [place] Prosthesis  Supervision    Doffing Prosthesis  Supervision               PT Short Term Goals - 07/04/18 1500      PT SHORT TERM GOAL #1   Title  Patient initiates adjusting ply socks with volume changes with PT guidance on correct number of ply fit. (All STGs Target Date: 07/29/18)    Time  4    Period  Weeks    Status  New    Target Date  07/29/18      PT SHORT TERM GOAL #2   Title  Patient tolerates daily wear of prosthesis >10 hrs total /day without skin issues.     Time  4    Period  Weeks    Status  Revised    Target Date  07/29/18      PT SHORT TERM GOAL #3   Title  Patient performs standing balance without UE support looking to side, overhead & downward with supervision.     Time  4    Period  Weeks    Status  New    Target Date  07/29/18      PT SHORT TERM GOAL #4   Title  Patient ambulates 100' with  rolling walker & prosthesis with supervision.    Time  4    Period  Weeks    Status  New    Target Date  07/29/18  PT SHORT TERM GOAL #5   Title  Patient negotiates ramps & curbs with RW  and stairs with 2 rails & prosthesis with supervision.     Time  4    Period  Weeks    Status  New    Target Date  07/29/18        PT Long Term Goals - 05/19/18 2037      PT LONG TERM GOAL #1   Title  Patient verbalizes & demonstrates understanding of proper prosthetic care to enable safe use of prosthesis.    Baseline  Patient is dependent in prosthetic care & requires minimal assist to donne. He has 2 wounds on residual limb.     Time  15    Period  Weeks    Status  New    Target Date  09/22/18      PT LONG TERM GOAL #2   Title  Patient tolerates daily prothesis wear >90% of awake hours without skin or limb pain issues.     Baseline  Patient has worn prosthesis 3 times since prosthesis delivery for 2 hours or less.     Time  15    Period  Weeks    Status  New    Target Date  09/22/18      PT LONG TERM GOAL #3   Title  Patient performs standing balance with rolling walker & prosthesis reaching 10" anteriorly, to floor, manages clothes for toileting, modified independent; task within [place] test with RW support >36/56 safely.     Baseline  tasks in [place] test with RW support 20/56, reaches 4" anterior to RW with minA, reaches 10" from floor with minA.    Time  15    Period  Weeks    Status  New    Target Date  09/22/18      PT LONG TERM GOAL #4   Title  Patient ambulates 300' with RW & prosthesis modified independent to enable basic community mobility.     Baseline  Patient ambulates 81' with RW & prosthesis with moderate assist.     Time  15    Period  Weeks    Status  New    Target Date  09/22/18      PT LONG TERM GOAL #5   Title  Patient negotiates ramps, curbs with rolling walker and stairs with 1 rail & cane with prosthesis modified independent for community access.      Baseline  Patient is totally dependent in negotiating barriers with prosthetic gait.     Time  15    Period  Weeks    Status  New    Target Date  09/22/18      Additional Long Term Goals   Additional Long Term Goals  Yes      PT LONG TERM GOAL #6   Title  Patient verbalizes understanding of fitness plan.     Baseline  Patient is dependent in appropriate exercises with medical issues.     Time  15    Period  Weeks    Status  New    Target Date  09/22/18      PT LONG TERM GOAL #7   Title  Patient reports pain increases </= 2 increments on 0-10 scale with standing & gait activities noted above LTGs.     Baseline  Patient reports bilateral lower extremity neuropathy pain ranging from 4/10 to 8/10    Time  15  Period  Weeks    Status  New    Target Date  09/22/18            Plan - 07/04/18 1654    Clinical Impression Statement  Today's skilled session focused on prosthetic gait with RW and introduction of ramps & curbs. patient's prosthesis appears ~3/8" too short and prosthetist to address after PT today.     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, left foot wounds with neuropathy    PT Frequency  2x / week   1x/wk for 3 weeks, then 2x/wk for 12 weeks   PT Duration  Other (comment)   15 weeks total for 27 visits permitted with Medicaid   PT Treatment/Interventions  ADLs/Self Care Home Management;Canalith Repostioning;DME Instruction;Gait training;Stair training;Functional mobility training;Therapeutic activities;Therapeutic exercise;Balance training;Neuromuscular re-education;Patient/family education;Prosthetic Training;Orthotic Fit/Training;Manual techniques;Vestibular    PT Next Visit Plan  continue with stair instruction, ramp/curbs with RW. Call Gerald Stabs with [facility] to set up appt for prosthetic adjustments.     Consulted and Agree with Plan of Care  Patient;Family member/caregiver    Family  Member Consulted  mother       Patient will benefit from skilled therapeutic intervention in order to improve the following deficits and impairments:  Abnormal gait, Decreased activity tolerance, Decreased balance, Decreased coordination, Decreased endurance, Decreased knowledge of use of DME, Decreased mobility, Decreased range of motion, Decreased skin integrity, Decreased strength, Dizziness, Difficulty walking, Increased edema, Impaired flexibility, Impaired sensation, Postural dysfunction, Pain, Prosthetic Dependency  Visit Diagnosis: Muscle weakness (generalized)  Unsteadiness on feet  Other abnormalities of gait and mobility  Abnormal posture     Problem List Patient Active Problem List   Diagnosis Date Noted  . 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 Non-pressure chronic ulcer of other part of left foot limited to breakdown of skin ([facility]) 02/02/2018  . 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 History of right below knee amputation ([facility]) 12/22/2017  . Fever   . Acute posthemorrhagic anemia   . Leukocytosis   . Falls 11/24/2017  . Benign essential HTN 11/24/2017  . Normocytic normochromic anemia 11/24/2017  . Sepsis ([facility]) 10/11/2017  . Uncontrolled type 2 diabetes mellitus with hyperglycemia ([facility]) 10/11/2017  . ARF (acute renal failure) ([facility]) 10/11/2017  . Nausea & vomiting 10/11/2017    Jamey Reas  PT, DPT 07/05/2018, 6:08 AM  [facility] [address] [place], [place], [address] Phone: [phone]   Fax:  [phone]  Name: JOSEANDRES MAZER MRN: [id number] Date of Birth: January 08, 1971

## 2018-07-06 NOTE — Therapy (Addendum)
[facility] [address] [place], [place], [address] Phone: [phone]   Fax:  [phone]  Physical Therapy Treatment  Patient Details  Name: [name] [name] MRN: [id number] Date of Birth: May 05, 1971 Referring Provider: Meridee Score, MD   Encounter Date: 07/05/2018  PT End of Session - 07/05/18 1448    Visit Number  7    Number of Visits  27    Authorization Type  Medicaid    Authorization Time Period  3 visits from 7/12 through 06/23/18; 12 additional visits authorized from 06/24/18- 08/04/18.    Authorization - Visit Number  6    Authorization - Number of Visits  15    PT Start Time  1400    PT Stop Time  1445    PT Time Calculation (min)  45 min    Equipment Utilized During Treatment  Gait belt    Activity Tolerance  Patient tolerated treatment well;Patient limited by fatigue    Behavior During Therapy  WFL for tasks assessed/performed       Past Medical History:  Diagnosis Date  . ARF (acute renal failure) ([facility]) 09/2017  . Diabetes mellitus without complication ([facility])   . Hypertension   . Necrotizing fasciitis ([facility]) 10/13/2017  . Wound dehiscence 11/24/2017    Past Surgical History:  Procedure Laterality Date  . AMPUTATION Right 10/13/2017   Procedure: RIGHT BELOW KNEE AMPUTATION;  Surgeon: [name], MD;  Location: [facility];  Service: Orthopedics;  Laterality: Right;  . AMPUTATION Right 11/24/2017   Procedure: AMPUTATION BELOW KNEE REVISION;  Surgeon: [name], MD;  Location: [facility];  Service: Orthopedics;  Laterality: Right;  . NO PAST SURGERIES      There were no vitals filed for this visit.  Subjective Assessment - 07/05/18 1402    Subjective  He walked into bathroom with walker & prosthesis.     Patient is accompained by:  Family member   mom in lobby   Pertinent History  R TTA, DM2, HTN, peripheral neuropathy, acute renal failure    Limitations  Lifting;Standing;Walking;House hold activities     Patient Stated Goals  To use prosthesis to walk in home & community,     Currently in Pain?  No/denies               07/05/18 1400  Transfers  Transfers Sit to Stand;Stand to Sit  Sit to Stand 5: Supervision;With upper extremity assist;From chair/3-in-1 (to RW for stabilization, chairs with & without armrests)  Sit to Stand Details (indicate cue type and reason) PT demo & instructed in technique from chairs without armrests pushing with UEs.   Stand to Sit 5: Supervision;With upper extremity assist;With armrests;To chair/3-in-1 (from RW for stability, chairs with & without armrests)  Stand to Sit Details PT demo & instructed in technique from chairs without armrests pushing with UEs.   Ambulation/Gait  Ambulation/Gait Yes  Ambulation/Gait Assistance 5: Supervision  Ambulation/Gait Assistance Details verbal cues on posture & step length  Ambulation Distance (Feet) 65 Feet (65' X 2)  Assistive device Rolling walker;Prosthesis  Gait Pattern Decreased step length - left;Decreased stance time - right;Decreased stride length;Decreased dorsiflexion - left;Decreased hip/knee flexion - right;Decreased weight shift to right;Right hip hike;Left steppage;Left foot flat;Right flexed knee in stance;Left flexed knee in stance;Antalgic;Lateral hip instability;Trunk flexed;Abducted- right;Poor foot clearance - left;Poor foot clearance - right;Step-through pattern  Stairs Yes  Stairs Assistance 5: Supervision  Stair Management Technique Two rails;Step to pattern;Forwards  Number of  Stairs 4  Ramp 4: Min assist (RW & TTA prosthesis)  Ramp Details (indicate cue type and reason) cues on posture & wt shift  Curb 4: Min assist (RW & prosthesis)  Curb Details (indicate cue type and reason) cues on technique & sequence  Prosthetics  Prosthetic Care Comments  use of baby oil on patella proximally to decrease friction rub,  prosthesis is 3/8" short with PT using plates to determine level pelvis.    Current prosthetic wear tolerance (days/week)  daily  Current prosthetic wear tolerance (#hours/day)  ~7-9 hours between 2 times over weekend   Residual limb condition  intact. does have redness in popliteal area from where the shrinker keeps sliding down/bunching up behind his knee. Saw Megan at [employer]: she tried to adjust fit of socket wiht pads and was unable to find a comfortable. she did adjust the height. she did not address the shrinker issue.                  Education Provided Residual limb care;Proper wear schedule/adjustment;Proper weight-bearing schedule/adjustment             [facility] Adult PT Treatment/Exercise - 07/06/18 0001      Prosthetics   Prosthetic Care Comments   reviewed use of baby oil on patella to decrease friction.  Increasing activity level by short walks (room to room in home) with RW and building frequency to start increasing activity level.     Current prosthetic wear tolerance (days/week)   daily    Current prosthetic wear tolerance (#hours/day)   ~7-9 hours between 2 times over weekend     Residual limb condition   intact. dry skin. dark color distally. normal temperature.     Education Provided  Residual limb care;Skin check;Prosthetic cleaning;Correct ply sock adjustment;Proper Donning;Proper wear schedule/adjustment;Other (comment)   see prosthetic care comments   Person(s) Educated  Patient;Parent(s)    Education Method  Explanation;Demonstration;Tactile cues;Verbal cues    Education Method  Verbalized understanding;Returned demonstration;Tactile cues required;Verbal cues required;Needs further instruction    Donning Prosthesis  Supervision    Doffing Prosthesis  Supervision               PT Short Term Goals - 07/04/18 1500      PT SHORT TERM GOAL #1   Title  Patient initiates adjusting ply socks with volume changes with PT guidance on correct number of ply fit. (All STGs Target Date: 07/29/18)    Time  4    Period  Weeks    Status  New     Target Date  07/29/18      PT SHORT TERM GOAL #2   Title  Patient tolerates daily wear of prosthesis >10 hrs total /day without skin issues.     Time  4    Period  Weeks    Status  Revised    Target Date  07/29/18      PT SHORT TERM GOAL #3   Title  Patient performs standing balance without UE support looking to side, overhead & downward with supervision.     Time  4    Period  Weeks    Status  New    Target Date  07/29/18      PT SHORT TERM GOAL #4   Title  Patient ambulates 100' with rolling walker & prosthesis with supervision.    Time  4    Period  Weeks    Status  New    Target Date  07/29/18      PT SHORT TERM GOAL #5   Title  Patient negotiates ramps & curbs with RW  and stairs with 2 rails & prosthesis with supervision.     Time  4    Period  Weeks    Status  New    Target Date  07/29/18        PT Long Term Goals - 05/19/18 2037      PT LONG TERM GOAL #1   Title  Patient verbalizes & demonstrates understanding of proper prosthetic care to enable safe use of prosthesis.    Baseline  Patient is dependent in prosthetic care & requires minimal assist to donne. He has 2 wounds on residual limb.     Time  15    Period  Weeks    Status  New    Target Date  09/22/18      PT LONG TERM GOAL #2   Title  Patient tolerates daily prothesis wear >90% of awake hours without skin or limb pain issues.     Baseline  Patient has worn prosthesis 3 times since prosthesis delivery for 2 hours or less.     Time  15    Period  Weeks    Status  New    Target Date  09/22/18      PT LONG TERM GOAL #3   Title  Patient performs standing balance with rolling walker & prosthesis reaching 10" anteriorly, to floor, manages clothes for toileting, modified independent; task within [place] test with RW support >36/56 safely.     Baseline  tasks in [place] test with RW support 20/56, reaches 4" anterior to RW with minA, reaches 10" from floor with minA.    Time  15    Period  Weeks    Status   New    Target Date  09/22/18      PT LONG TERM GOAL #4   Title  Patient ambulates 300' with RW & prosthesis modified independent to enable basic community mobility.     Baseline  Patient ambulates 43' with RW & prosthesis with moderate assist.     Time  15    Period  Weeks    Status  New    Target Date  09/22/18      PT LONG TERM GOAL #5   Title  Patient negotiates ramps, curbs with rolling walker and stairs with 1 rail & cane with prosthesis modified independent for community access.     Baseline  Patient is totally dependent in negotiating barriers with prosthetic gait.     Time  15    Period  Weeks    Status  New    Target Date  09/22/18      Additional Long Term Goals   Additional Long Term Goals  Yes      PT LONG TERM GOAL #6   Title  Patient verbalizes understanding of fitness plan.     Baseline  Patient is dependent in appropriate exercises with medical issues.     Time  15    Period  Weeks    Status  New    Target Date  09/22/18      PT LONG TERM GOAL #7   Title  Patient reports pain increases </= 2 increments on 0-10 scale with standing & gait activities noted above LTGs.     Baseline  Patient reports bilateral lower extremity neuropathy pain ranging from 4/10 to 8/10  Time  15    Period  Weeks    Status  New    Target Date  09/22/18            Plan - 07/05/18 1529    Clinical Impression Statement  Patient improved ability to negotiate ramp, curb &stairs with TTA prosthesis. Patient's gait was improved with height change to prosthesis.     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, left foot wounds with neuropathy    PT Frequency  2x / week   1x/wk for 3 weeks, then 2x/wk for 12 weeks   PT Duration  Other (comment)   15 weeks total for 27 visits permitted with Medicaid   PT Treatment/Interventions  ADLs/Self Care Home Management;Canalith Repostioning;DME Instruction;Gait  training;Stair training;Functional mobility training;Therapeutic activities;Therapeutic exercise;Balance training;Neuromuscular re-education;Patient/family education;Prosthetic Training;Orthotic Fit/Training;Manual techniques;Vestibular    PT Next Visit Plan  continue with stair instruction, ramp/curbs with RW. increase wear & activity level.     Consulted and Agree with Plan of Care  Patient;Family member/caregiver    Family Member Consulted  mother       Patient will benefit from skilled therapeutic intervention in order to improve the following deficits and impairments:  Abnormal gait, Decreased activity tolerance, Decreased balance, Decreased coordination, Decreased endurance, Decreased knowledge of use of DME, Decreased mobility, Decreased range of motion, Decreased skin integrity, Decreased strength, Dizziness, Difficulty walking, Increased edema, Impaired flexibility, Impaired sensation, Postural dysfunction, Pain, Prosthetic Dependency  Visit Diagnosis: Muscle weakness (generalized)  Unsteadiness on feet  Other abnormalities of gait and mobility  Abnormal posture     Problem List Patient Active Problem List   Diagnosis Date Noted  . 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 Non-pressure chronic ulcer of other part of left foot limited to breakdown of skin ([facility]) 02/02/2018  . 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 History of right below knee amputation ([facility]) 12/22/2017  . Fever   . Acute posthemorrhagic anemia   . Leukocytosis   . Falls 11/24/2017  . Benign essential HTN 11/24/2017  . Normocytic normochromic anemia 11/24/2017  . Sepsis ([facility]) 10/11/2017  . Uncontrolled type 2 diabetes mellitus with hyperglycemia ([facility]) 10/11/2017  . ARF (acute renal failure) ([facility]) 10/11/2017  . Nausea & vomiting 10/11/2017    Jamey Reas PT, DPT 07/06/2018, 3:31 PM  [facility] [address] [place], [place], [address] Phone: [phone]   Fax:  [phone]  Name: [name] [name] MRN: [id number] Date of Birth: [birth year]

## 2018-07-13 ENCOUNTER — Encounter: Payer: Self-pay | Admitting: Physical Therapy

## 2018-07-13 ENCOUNTER — Ambulatory Visit: Payer: Medicaid Other | Admitting: Physical Therapy

## 2018-07-13 DIAGNOSIS — M6281 Muscle weakness (generalized): Secondary | ICD-10-CM

## 2018-07-13 DIAGNOSIS — R293 Abnormal posture: Secondary | ICD-10-CM

## 2018-07-13 DIAGNOSIS — R2689 Other abnormalities of gait and mobility: Secondary | ICD-10-CM

## 2018-07-13 DIAGNOSIS — R2681 Unsteadiness on feet: Secondary | ICD-10-CM

## 2018-07-14 NOTE — Therapy (Signed)
[facility] [address] [place], [place], [address] Phone: [phone]   Fax:  [phone]  Physical Therapy Treatment  Patient Details  Name: [name] [name] MRN: [id number] Date of Birth: February 23, 1971 Referring Provider: Meridee Score, MD   Encounter Date: 07/13/2018  PT End of Session - 07/13/18 1800    Visit Number  8    Number of Visits  27    Authorization Type  Medicaid    Authorization Time Period  3 visits from 7/12 through 06/23/18; 12 additional visits authorized from 06/24/18- 08/04/18.    Authorization - Visit Number  7    Authorization - Number of Visits  15    PT Start Time  6269    PT Stop Time  1530    PT Time Calculation (min)  45 min    Equipment Utilized During Treatment  Gait belt    Activity Tolerance  Patient tolerated treatment well;Patient limited by fatigue    Behavior During Therapy  WFL for tasks assessed/performed       Past Medical History:  Diagnosis Date  . ARF (acute renal failure) ([facility]) 09/2017  . Diabetes mellitus without complication ([facility])   . Hypertension   . Necrotizing fasciitis ([facility]) 10/13/2017  . Wound dehiscence 11/24/2017    Past Surgical History:  Procedure Laterality Date  . AMPUTATION Right 10/13/2017   Procedure: RIGHT BELOW KNEE AMPUTATION;  Surgeon: [name], MD;  Location: [facility];  Service: Orthopedics;  Laterality: Right;  . AMPUTATION Right 11/24/2017   Procedure: AMPUTATION BELOW KNEE REVISION;  Surgeon: [name], MD;  Location: [facility];  Service: Orthopedics;  Laterality: Right;  . NO PAST SURGERIES      There were no vitals filed for this visit.  Subjective Assessment - 07/13/18 1445    Subjective  He is wearing prosthesis most of awake hours. Upon questioning reports not drying & is sweaty.     Patient is accompained by:  Family member   mom in lobby   Pertinent History  R TTA, DM2, HTN, peripheral neuropathy, acute renal failure    Limitations   Lifting;Standing;Walking;House hold activities    Patient Stated Goals  To use prosthesis to walk in home & community,     Currently in Pain?  No/denies                       [facility] Adult PT Treatment/Exercise - 07/13/18 1445      Transfers   Transfers  Sit to Stand;Stand to Sit    Sit to Stand  5: Supervision;With upper extremity assist;From chair/3-in-1   to RW for stabilization, chairs with & without armrests   Stand to Sit  5: Supervision;With upper extremity assist;With armrests;To chair/3-in-1   from RW for stability, chairs with & without armrests     Ambulation/Gait   Ambulation/Gait  Yes    Ambulation/Gait Assistance  5: Supervision;4: Min assist   balance loss from prosthesis too short   Ambulation Distance (Feet)  130 Feet    Assistive device  Rolling walker;Prosthesis    Gait Pattern  Decreased step length - left;Decreased stance time - right;Decreased stride length;Decreased dorsiflexion - left;Decreased hip/knee flexion - right;Decreased weight shift to right;Right hip hike;Left steppage;Left foot flat;Right flexed knee in stance;Left flexed knee in stance;Antalgic;Lateral hip instability;Trunk flexed;Abducted- right;Poor foot clearance - left;Poor foot clearance - right;Step-through pattern    Ambulation Surface  Indoor;Level    Stairs  --  Stairs Assistance  --    Stair Management Technique  --    Number of Stairs  --    Ramp  --    Curb  --      Neuro Re-ed    Neuro Re-ed Details   In parallel bars with mirror for visual feedback & 3/4" lift under prosthesis due to height issue: intermittent UE support & manual /tactile cues- Eyes open head turns & eyes closed static 10 sec , reaching UEs anterior & active scapula retraction / shoulder extension      Prosthetics   Prosthetic Care Comments   prosthesis 3/4" too short. Seeing prosthetist after PT today.    Current prosthetic wear tolerance (days/week)   daily    Current prosthetic wear tolerance  (#hours/day)   ~7-9 hours between 2 times over weekend     Residual limb condition   intact. does have redness in popliteal area from where the shrinker keeps sliding down/bunching up behind his knee. Saw Megan at [employer]: she tried to adjust fit of socket wiht pads and was unable to find a comfortable. she did adjust the height. she did not address the shrinker issue.                    Education Provided  Residual limb care;Proper wear schedule/adjustment;Proper weight-bearing schedule/adjustment    Person(s) Educated  Patient    Education Method  Explanation;Verbal cues    Education Method  Verbalized understanding;Verbal cues required               PT Short Term Goals - 07/04/18 1500      PT SHORT TERM GOAL #1   Title  Patient initiates adjusting ply socks with volume changes with PT guidance on correct number of ply fit. (All STGs Target Date: 07/29/18)    Time  4    Period  Weeks    Status  New    Target Date  07/29/18      PT SHORT TERM GOAL #2   Title  Patient tolerates daily wear of prosthesis >10 hrs total /day without skin issues.     Time  4    Period  Weeks    Status  Revised    Target Date  07/29/18      PT SHORT TERM GOAL #3   Title  Patient performs standing balance without UE support looking to side, overhead & downward with supervision.     Time  4    Period  Weeks    Status  New    Target Date  07/29/18      PT SHORT TERM GOAL #4   Title  Patient ambulates 100' with rolling walker & prosthesis with supervision.    Time  4    Period  Weeks    Status  New    Target Date  07/29/18      PT SHORT TERM GOAL #5   Title  Patient negotiates ramps & curbs with RW  and stairs with 2 rails & prosthesis with supervision.     Time  4    Period  Weeks    Status  New    Target Date  07/29/18        PT Long Term Goals - 05/19/18 2037      PT LONG TERM GOAL #1   Title  Patient verbalizes & demonstrates understanding of proper prosthetic care to enable safe  use of prosthesis.    Baseline  Patient is dependent in prosthetic care & requires minimal assist to donne. He has 2 wounds on residual limb.     Time  15    Period  Weeks    Status  New    Target Date  09/22/18      PT LONG TERM GOAL #2   Title  Patient tolerates daily prothesis wear >90% of awake hours without skin or limb pain issues.     Baseline  Patient has worn prosthesis 3 times since prosthesis delivery for 2 hours or less.     Time  15    Period  Weeks    Status  New    Target Date  09/22/18      PT LONG TERM GOAL #3   Title  Patient performs standing balance with rolling walker & prosthesis reaching 10" anteriorly, to floor, manages clothes for toileting, modified independent; task within [place] test with RW support >36/56 safely.     Baseline  tasks in [place] test with RW support 20/56, reaches 4" anterior to RW with minA, reaches 10" from floor with minA.    Time  15    Period  Weeks    Status  New    Target Date  09/22/18      PT LONG TERM GOAL #4   Title  Patient ambulates 300' with RW & prosthesis modified independent to enable basic community mobility.     Baseline  Patient ambulates 20' with RW & prosthesis with moderate assist.     Time  15    Period  Weeks    Status  New    Target Date  09/22/18      PT LONG TERM GOAL #5   Title  Patient negotiates ramps, curbs with rolling walker and stairs with 1 rail & cane with prosthesis modified independent for community access.     Baseline  Patient is totally dependent in negotiating barriers with prosthetic gait.     Time  15    Period  Weeks    Status  New    Target Date  09/22/18      Additional Long Term Goals   Additional Long Term Goals  Yes      PT LONG TERM GOAL #6   Title  Patient verbalizes understanding of fitness plan.     Baseline  Patient is dependent in appropriate exercises with medical issues.     Time  15    Period  Weeks    Status  New    Target Date  09/22/18      PT LONG TERM GOAL #7    Title  Patient reports pain increases </= 2 increments on 0-10 scale with standing & gait activities noted above LTGs.     Baseline  Patient reports bilateral lower extremity neuropathy pain ranging from 4/10 to 8/10    Time  15    Period  Weeks    Status  New    Target Date  09/22/18            Plan - 07/13/18 1800    Clinical Impression Statement  Patient's prosthesis seems 3/4" too short. PT set-up appt with prosthetist after PT today to have lengthened. Patient had increased balance problems with length issue. PT focused remainder of session on balance with lift under prosthesis to compensate.     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,  left foot wounds with neuropathy    PT Frequency  2x / week   1x/wk for 3 weeks, then 2x/wk for 12 weeks   PT Duration  Other (comment)   15 weeks total for 27 visits permitted with Medicaid   PT Treatment/Interventions  ADLs/Self Care Home Management;Canalith Repostioning;DME Instruction;Gait training;Stair training;Functional mobility training;Therapeutic activities;Therapeutic exercise;Balance training;Neuromuscular re-education;Patient/family education;Prosthetic Training;Orthotic Fit/Training;Manual techniques;Vestibular    PT Next Visit Plan  continue with stair instruction, ramp/curbs with RW. increase wear & activity level.     Consulted and Agree with Plan of Care  Patient;Family member/caregiver    Family Member Consulted  mother       Patient will benefit from skilled therapeutic intervention in order to improve the following deficits and impairments:  Abnormal gait, Decreased activity tolerance, Decreased balance, Decreased coordination, Decreased endurance, Decreased knowledge of use of DME, Decreased mobility, Decreased range of motion, Decreased skin integrity, Decreased strength, Dizziness, Difficulty walking, Increased edema, Impaired flexibility, Impaired  sensation, Postural dysfunction, Pain, Prosthetic Dependency  Visit Diagnosis: Muscle weakness (generalized)  Unsteadiness on feet  Other abnormalities of gait and mobility  Abnormal posture     Problem List Patient Active Problem List   Diagnosis Date Noted  . 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 Non-pressure chronic ulcer of other part of left foot limited to breakdown of skin ([facility]) 02/02/2018  . 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 History of right below knee amputation ([facility]) 12/22/2017  . Fever   . Acute posthemorrhagic anemia   . Leukocytosis   . Falls 11/24/2017  . Benign essential HTN 11/24/2017  . Normocytic normochromic anemia 11/24/2017  . Sepsis ([facility]) 10/11/2017  . Uncontrolled type 2 diabetes mellitus with hyperglycemia ([facility]) 10/11/2017  . ARF (acute renal failure) ([facility]) 10/11/2017  . Nausea & vomiting 10/11/2017    Jamey Reas PT, DPT 07/14/2018, 6:47 PM  [facility] [address] [place], [place], [address] Phone: [phone]   Fax:  [phone]  Name: [name] [name] MRN: [id number] Date of Birth: [birth year]

## 2018-07-20 ENCOUNTER — Encounter: Payer: Self-pay | Admitting: Physical Therapy

## 2018-07-20 ENCOUNTER — Ambulatory Visit: Payer: Medicaid Other | Admitting: Physical Therapy

## 2018-07-20 DIAGNOSIS — R293 Abnormal posture: Secondary | ICD-10-CM

## 2018-07-20 DIAGNOSIS — R2689 Other abnormalities of gait and mobility: Secondary | ICD-10-CM

## 2018-07-20 DIAGNOSIS — R2681 Unsteadiness on feet: Secondary | ICD-10-CM

## 2018-07-20 DIAGNOSIS — M6281 Muscle weakness (generalized): Secondary | ICD-10-CM | POA: Diagnosis not present

## 2018-07-20 DIAGNOSIS — R296 Repeated falls: Secondary | ICD-10-CM

## 2018-07-20 NOTE — Therapy (Signed)
[facility] [address] [place], [place], [address] Phone: [phone]   Fax:  [phone]  Physical Therapy Treatment  Patient Details  Name: [name] [name] MRN: [id number] Date of Birth: [birth year] Referring Provider: Meridee Score, MD   Encounter Date: 07/20/2018  PT End of Session - 07/20/18 2211    Visit Number  9    Number of Visits  27    Authorization Type  Medicaid    Authorization Time Period  3 visits from 7/12 through 06/23/18; 12 additional visits authorized from 06/24/18- 08/04/18.    Authorization - Visit Number  8    Authorization - Number of Visits  15    PT Start Time  6962    PT Stop Time  1530    PT Time Calculation (min)  43 min    Equipment Utilized During Treatment  Gait belt    Activity Tolerance  Patient tolerated treatment well;Patient limited by fatigue    Behavior During Therapy  WFL for tasks assessed/performed       Past Medical History:  Diagnosis Date  . ARF (acute renal failure) ([facility]) 09/2017  . Diabetes mellitus without complication ([facility])   . Hypertension   . Necrotizing fasciitis ([facility]) 10/13/2017  . Wound dehiscence 11/24/2017    Past Surgical History:  Procedure Laterality Date  . AMPUTATION Right 10/13/2017   Procedure: RIGHT BELOW KNEE AMPUTATION;  Surgeon: [name], MD;  Location: [facility];  Service: Orthopedics;  Laterality: Right;  . AMPUTATION Right 11/24/2017   Procedure: AMPUTATION BELOW KNEE REVISION;  Surgeon: [name], MD;  Location: [facility];  Service: Orthopedics;  Laterality: Right;  . NO PAST SURGERIES      There were no vitals filed for this visit.  Subjective Assessment - 07/20/18 1448    Subjective  He is wearing prosthesis most of awake hours. He is not drying as does not notice sweating.     Patient is accompained by:  Family member   mom in lobby   Pertinent History  R TTA, DM2, HTN, peripheral neuropathy, acute renal failure    Limitations   Lifting;Standing;Walking;House hold activities    Patient Stated Goals  To use prosthesis to walk in home & community,     Currently in Pain?  No/denies                       [facility] Adult PT Treatment/Exercise - 07/20/18 1445      Transfers   Transfers  Sit to Stand;Stand to Sit    Sit to Stand  5: Supervision;With upper extremity assist;From chair/3-in-1   to RW for stabilization, chairs with & without armrests   Stand to Sit  5: Supervision;With upper extremity assist;With armrests;To chair/3-in-1   from RW for stability, chairs with & without armrests     Ambulation/Gait   Ambulation/Gait  Yes    Ambulation/Gait Assistance  5: Supervision;3: Mod assist   supervision with RW & ModA with cane   Ambulation/Gait Assistance Details  Verbal & tactile cues on technique with cane.     Ambulation Distance (Feet)  150 Feet   150' X 2 RW and 100' X 2 with cane/HHA   Assistive device  Rolling walker;Prosthesis;Straight cane;1 person hand held assist   straight cane/HHA except last 30' no HHA   Gait Pattern  Decreased step length - left;Decreased stance time - right;Decreased stride length;Decreased dorsiflexion - left;Decreased hip/knee flexion - right;Decreased weight shift  to right;Right hip hike;Left steppage;Left foot flat;Right flexed knee in stance;Left flexed knee in stance;Antalgic;Lateral hip instability;Trunk flexed;Abducted- right;Poor foot clearance - left;Poor foot clearance - right;Step-through pattern    Ambulation Surface  Indoor;Level    Stairs  Yes    Stairs Assistance  5: Supervision    Stairs Assistance Details (indicate cue type and reason)  Demo & verbal cues on reciprocal pattern    Stair Management Technique  Two rails;Step to pattern;Alternating pattern;Forwards    Number of Stairs  4   3 reps     Neuro Re-ed    Neuro Re-ed Details   --      Prosthetics   Prosthetic Care Comments   Dorthy Cooler, [facility] present & corrected toe-in prosthetic foot position.  Increase wear to 5 hrs 2x/day. Dry limb/liner half way thru wear & PRN (reviewed signs of sweating)    Current prosthetic wear tolerance (days/week)   daily    Current prosthetic wear tolerance (#hours/day)   ~7-9 hours between 2 times over weekend     Residual limb condition   intact. does have redness in popliteal area from where the shrinker keeps sliding down/bunching up behind his knee. Saw Megan at [employer]: she tried to adjust fit of socket wiht pads and was unable to find a comfortable. she did adjust the height. she did not address the shrinker issue.                    Education Provided  Residual limb care;Proper wear schedule/adjustment;Proper weight-bearing schedule/adjustment    Person(s) Educated  Patient    Education Method  Explanation;Verbal cues    Education Method  Verbalized understanding;Verbal cues required;Needs further instruction    Donning Prosthesis  Supervision               PT Short Term Goals - 07/04/18 1500      PT SHORT TERM GOAL #1   Title  Patient initiates adjusting ply socks with volume changes with PT guidance on correct number of ply fit. (All STGs Target Date: 07/29/18)    Time  4    Period  Weeks    Status  New    Target Date  07/29/18      PT SHORT TERM GOAL #2   Title  Patient tolerates daily wear of prosthesis >10 hrs total /day without skin issues.     Time  4    Period  Weeks    Status  Revised    Target Date  07/29/18      PT SHORT TERM GOAL #3   Title  Patient performs standing balance without UE support looking to side, overhead & downward with supervision.     Time  4    Period  Weeks    Status  New    Target Date  07/29/18      PT SHORT TERM GOAL #4   Title  Patient ambulates 100' with rolling walker & prosthesis with supervision.    Time  4    Period  Weeks    Status  New    Target Date  07/29/18      PT SHORT TERM GOAL #5   Title  Patient negotiates ramps & curbs with RW  and stairs with 2 rails & prosthesis with  supervision.     Time  4    Period  Weeks    Status  New    Target Date  07/29/18  PT Long Term Goals - 05/19/18 2037      PT LONG TERM GOAL #1   Title  Patient verbalizes & demonstrates understanding of proper prosthetic care to enable safe use of prosthesis.    Baseline  Patient is dependent in prosthetic care & requires minimal assist to donne. He has 2 wounds on residual limb.     Time  15    Period  Weeks    Status  New    Target Date  09/22/18      PT LONG TERM GOAL #2   Title  Patient tolerates daily prothesis wear >90% of awake hours without skin or limb pain issues.     Baseline  Patient has worn prosthesis 3 times since prosthesis delivery for 2 hours or less.     Time  15    Period  Weeks    Status  New    Target Date  09/22/18      PT LONG TERM GOAL #3   Title  Patient performs standing balance with rolling walker & prosthesis reaching 10" anteriorly, to floor, manages clothes for toileting, modified independent; task within [place] test with RW support >36/56 safely.     Baseline  tasks in [place] test with RW support 20/56, reaches 4" anterior to RW with minA, reaches 10" from floor with minA.    Time  15    Period  Weeks    Status  New    Target Date  09/22/18      PT LONG TERM GOAL #4   Title  Patient ambulates 300' with RW & prosthesis modified independent to enable basic community mobility.     Baseline  Patient ambulates 48' with RW & prosthesis with moderate assist.     Time  15    Period  Weeks    Status  New    Target Date  09/22/18      PT LONG TERM GOAL #5   Title  Patient negotiates ramps, curbs with rolling walker and stairs with 1 rail & cane with prosthesis modified independent for community access.     Baseline  Patient is totally dependent in negotiating barriers with prosthetic gait.     Time  15    Period  Weeks    Status  New    Target Date  09/22/18      Additional Long Term Goals   Additional Long Term Goals  Yes      PT LONG  TERM GOAL #6   Title  Patient verbalizes understanding of fitness plan.     Baseline  Patient is dependent in appropriate exercises with medical issues.     Time  15    Period  Weeks    Status  New    Target Date  09/22/18      PT LONG TERM GOAL #7   Title  Patient reports pain increases </= 2 increments on 0-10 scale with standing & gait activities noted above LTGs.     Baseline  Patient reports bilateral lower extremity neuropathy pain ranging from 4/10 to 8/10    Time  15    Period  Weeks    Status  New    Target Date  09/22/18            Plan - 07/20/18 2211    Clinical Impression Statement  height change of prosthesis improved blane and gait. PT was able to progress gait with cane with skilled assistance. PT also  progressed stairs to reciprocal with 2 rails to initiate ability to raise/lower body weight.     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, left foot wounds with neuropathy    PT Frequency  2x / week   1x/wk for 3 weeks, then 2x/wk for 12 weeks   PT Duration  Other (comment)   15 weeks total for 27 visits permitted with Medicaid   PT Treatment/Interventions  ADLs/Self Care Home Management;Canalith Repostioning;DME Instruction;Gait training;Stair training;Functional mobility training;Therapeutic activities;Therapeutic exercise;Balance training;Neuromuscular re-education;Patient/family education;Prosthetic Training;Orthotic Fit/Training;Manual techniques;Vestibular    PT Next Visit Plan  continue with stair instruction, ramp/curbs with RW. increase wear & activity level.     Consulted and Agree with Plan of Care  Patient;Family member/caregiver    Family Member Consulted  mother       Patient will benefit from skilled therapeutic intervention in order to improve the following deficits and impairments:  Abnormal gait, Decreased activity tolerance, Decreased balance, Decreased coordination,  Decreased endurance, Decreased knowledge of use of DME, Decreased mobility, Decreased range of motion, Decreased skin integrity, Decreased strength, Dizziness, Difficulty walking, Increased edema, Impaired flexibility, Impaired sensation, Postural dysfunction, Pain, Prosthetic Dependency  Visit Diagnosis: Muscle weakness (generalized)  Unsteadiness on feet  Other abnormalities of gait and mobility  Abnormal posture  Repeated falls     Problem List Patient Active Problem List   Diagnosis Date Noted  . 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 Non-pressure chronic ulcer of other part of left foot limited to breakdown of skin ([facility]) 02/02/2018  . 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 History of right below knee amputation ([facility]) 12/22/2017  . Fever   . Acute posthemorrhagic anemia   . Leukocytosis   . Falls 11/24/2017  . Benign essential HTN 11/24/2017  . Normocytic normochromic anemia 11/24/2017  . Sepsis ([facility]) 10/11/2017  . Uncontrolled type 2 diabetes mellitus with hyperglycemia ([facility]) 10/11/2017  . ARF (acute renal failure) ([facility]) 10/11/2017  . Nausea & vomiting 10/11/2017    [name] Reas PT, DPT 07/20/2018, 10:14 PM  [facility] [address] [place], [place], [address] Phone: [phone]   Fax:  [phone]  Name: ASHVIN ADELSON MRN: [id number] Date of Birth: 01-15-1971

## 2018-07-22 ENCOUNTER — Ambulatory Visit: Payer: Medicaid Other | Admitting: Physical Therapy

## 2018-07-22 ENCOUNTER — Encounter: Payer: Self-pay | Admitting: Physical Therapy

## 2018-07-22 DIAGNOSIS — R2681 Unsteadiness on feet: Secondary | ICD-10-CM

## 2018-07-22 DIAGNOSIS — R293 Abnormal posture: Secondary | ICD-10-CM

## 2018-07-22 DIAGNOSIS — M6281 Muscle weakness (generalized): Secondary | ICD-10-CM | POA: Diagnosis not present

## 2018-07-22 DIAGNOSIS — R2689 Other abnormalities of gait and mobility: Secondary | ICD-10-CM

## 2018-07-23 NOTE — Therapy (Signed)
18    Clinical Impression Statement  Today's skilled session continued to focus on prosthetic education and use of RW/prosthesis on various surfaces/barriers. Pt is making steady progress toward goals with introducion of outdoor surfaces today. Pt should benefit from continued PT to progress toward unmet goals.     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, left foot wounds with neuropathy    PT Frequency  2x / week   1x/wk for 3 weeks, then 2x/wk for 12 weeks   PT Duration  Other (comment)   15 weeks total for 27 visits permitted with Medicaid   PT Treatment/Interventions  ADLs/Self Care Home Management;Canalith Repostioning;DME Instruction;Gait training;Stair training;Functional mobility training;Therapeutic activities;Therapeutic exercise;Balance training;Neuromuscular re-education;Patient/family education;Prosthetic Training;Orthotic Fit/Training;Manual techniques;Vestibular    PT Next Visit Plan  continue with stair instruction, ramp/curbs  with RW. increase wear & activity level. level gait with straight cane; balance activities with emphasis on decreased UE reliance    Consulted and Agree with Plan of Care  Patient;Family member/caregiver    Family Member Consulted  mother       Patient will benefit from skilled therapeutic intervention in order to improve the following deficits and impairments:  Abnormal gait, Decreased activity tolerance, Decreased balance, Decreased coordination, Decreased endurance, Decreased knowledge of use of DME, Decreased mobility, Decreased range of motion, Decreased skin integrity, Decreased strength, Dizziness, Difficulty walking, Increased edema, Impaired flexibility, Impaired sensation, Postural dysfunction, Pain, Prosthetic Dependency  Visit Diagnosis: Muscle weakness (generalized)  Unsteadiness on feet  Other abnormalities of gait and mobility  Abnormal posture     Problem List Patient Active Problem List   Diagnosis Date Noted  . 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 Non-pressure chronic ulcer of other part of left foot limited to breakdown of skin ([place]) 02/02/2018  . 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 History of right below knee amputation ([place]) 12/22/2017  . Fever   . Acute posthemorrhagic anemia   . Leukocytosis   . Falls 11/24/2017  . Benign essential HTN 11/24/2017  . Normocytic normochromic anemia 11/24/2017  . Sepsis ([place]) 10/11/2017  . Uncontrolled type 2 diabetes mellitus with hyperglycemia ([place]) 10/11/2017  . ARF (acute renal failure) ([place]) 10/11/2017  . Nausea & vomiting 10/11/2017    Willow Ora, PTA, [facility] [address], [place], [place] [address] [phone] 07/23/18, 5:14 PM   Name: [name] [name] MRN: [id number] Date of Birth: 07-18-1971  18    Clinical Impression Statement  Today's skilled session continued to focus on prosthetic education and use of RW/prosthesis on various surfaces/barriers. Pt is making steady progress toward goals with introducion of outdoor surfaces today. Pt should benefit from continued PT to progress toward unmet goals.     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, left foot wounds with neuropathy    PT Frequency  2x / week   1x/wk for 3 weeks, then 2x/wk for 12 weeks   PT Duration  Other (comment)   15 weeks total for 27 visits permitted with Medicaid   PT Treatment/Interventions  ADLs/Self Care Home Management;Canalith Repostioning;DME Instruction;Gait training;Stair training;Functional mobility training;Therapeutic activities;Therapeutic exercise;Balance training;Neuromuscular re-education;Patient/family education;Prosthetic Training;Orthotic Fit/Training;Manual techniques;Vestibular    PT Next Visit Plan  continue with stair instruction, ramp/curbs  with RW. increase wear & activity level. level gait with straight cane; balance activities with emphasis on decreased UE reliance    Consulted and Agree with Plan of Care  Patient;Family member/caregiver    Family Member Consulted  mother       Patient will benefit from skilled therapeutic intervention in order to improve the following deficits and impairments:  Abnormal gait, Decreased activity tolerance, Decreased balance, Decreased coordination, Decreased endurance, Decreased knowledge of use of DME, Decreased mobility, Decreased range of motion, Decreased skin integrity, Decreased strength, Dizziness, Difficulty walking, Increased edema, Impaired flexibility, Impaired sensation, Postural dysfunction, Pain, Prosthetic Dependency  Visit Diagnosis: Muscle weakness (generalized)  Unsteadiness on feet  Other abnormalities of gait and mobility  Abnormal posture     Problem List Patient Active Problem List   Diagnosis Date Noted  . 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 Non-pressure chronic ulcer of other part of left foot limited to breakdown of skin ([place]) 02/02/2018  . 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 History of right below knee amputation ([place]) 12/22/2017  . Fever   . Acute posthemorrhagic anemia   . Leukocytosis   . Falls 11/24/2017  . Benign essential HTN 11/24/2017  . Normocytic normochromic anemia 11/24/2017  . Sepsis ([place]) 10/11/2017  . Uncontrolled type 2 diabetes mellitus with hyperglycemia ([place]) 10/11/2017  . ARF (acute renal failure) ([place]) 10/11/2017  . Nausea & vomiting 10/11/2017    Willow Ora, PTA, [facility] [address], [place], [place] [address] [phone] 07/23/18, 5:14 PM   Name: [name] [name] MRN: [id number] Date of Birth: 07-18-1971  [facility] [address] [place], [place], [address] Phone: [phone]   Fax:  [phone]  Physical Therapy Treatment  Patient Details  Name: [name] [name] MRN: [id number] Date of Birth: May 06, 1971 Referring Provider: Meridee Score, MD   Encounter Date: 07/22/2018  PT End of Session - 07/22/18 1457    Visit Number  10    Number of Visits  27    Authorization Type  Medicaid    Authorization Time Period  3 visits from 7/12 through 06/23/18; 12 additional visits authorized from 06/24/18- 08/04/18.    Authorization - Visit Number  9    Authorization - Number of Visits  15    PT Start Time  2952    PT Stop Time  1529    PT Time Calculation (min)  41 min    Equipment Utilized During Treatment  Gait belt    Activity Tolerance  Patient tolerated treatment well;Patient limited by fatigue    Behavior During Therapy  WFL for tasks assessed/performed       Past Medical History:  Diagnosis Date  . ARF (acute renal failure) ([place]) 09/2017  . Diabetes mellitus without complication ([place])   . Hypertension   . Necrotizing fasciitis ([place]) 10/13/2017  . Wound dehiscence 11/24/2017    Past Surgical History:  Procedure Laterality Date  . AMPUTATION Right 10/13/2017   Procedure: RIGHT BELOW KNEE AMPUTATION;  Surgeon: [name], MD;  Location: [facility];  Service: Orthopedics;  Laterality: Right;  . AMPUTATION Right 11/24/2017   Procedure: AMPUTATION BELOW KNEE REVISION;  Surgeon: [name], MD;  Location: [place];  Service: Orthopedics;  Laterality: Right;  . NO PAST SURGERIES      There were no vitals filed for this visit.  Subjective Assessment - 07/22/18 1454    Subjective  No new complaitns. No falls. Walking "some" at home.    Patient is accompained by:  Family member    Pertinent History  R TTA, DM2, HTN, peripheral neuropathy, acute renal failure    Limitations  Lifting;Standing;Walking;House hold activities    Patient  Stated Goals  To use prosthesis to walk in home & community,     Currently in Pain?  Yes    Pain Score  6     Pain Location  Leg   tibial area   Pain Orientation  Right    Pain Descriptors / Indicators  Pressure    Pain Type  Chronic pain;Neuropathic pain    Pain Onset  More than a month ago    Pain Frequency  Intermittent    Aggravating Factors   weight bearing on prosthesis    Pain Relieving Factors  unknown          [facility] Adult PT Treatment/Exercise - 07/22/18 1458      Transfers   Transfers  Sit to Stand;Stand to Sit    Sit to Stand  5: Supervision;With upper extremity assist;From chair/3-in-1    Stand to Sit  5: Supervision;With upper extremity assist;With armrests;To chair/3-in-1      Ambulation/Gait   Ambulation/Gait  Yes    Ambulation/Gait Assistance  5: Supervision;4: Min assist;4: Min guard    Ambulation/Gait Assistance Details  cues for use of RW on gravel, grass, rubber mulch and uneven pavement. cues needed with all gait for posture, step length and weight shifting with gait.     Ambulation Distance (Feet)  200 Feet   x1 in/outdoors, plus around gym with activity  [facility] [address] [place], [place], [address] Phone: [phone]   Fax:  [phone]  Physical Therapy Treatment  Patient Details  Name: [name] [name] MRN: [id number] Date of Birth: May 06, 1971 Referring Provider: Meridee Score, MD   Encounter Date: 07/22/2018  PT End of Session - 07/22/18 1457    Visit Number  10    Number of Visits  27    Authorization Type  Medicaid    Authorization Time Period  3 visits from 7/12 through 06/23/18; 12 additional visits authorized from 06/24/18- 08/04/18.    Authorization - Visit Number  9    Authorization - Number of Visits  15    PT Start Time  2952    PT Stop Time  1529    PT Time Calculation (min)  41 min    Equipment Utilized During Treatment  Gait belt    Activity Tolerance  Patient tolerated treatment well;Patient limited by fatigue    Behavior During Therapy  WFL for tasks assessed/performed       Past Medical History:  Diagnosis Date  . ARF (acute renal failure) ([place]) 09/2017  . Diabetes mellitus without complication ([place])   . Hypertension   . Necrotizing fasciitis ([place]) 10/13/2017  . Wound dehiscence 11/24/2017    Past Surgical History:  Procedure Laterality Date  . AMPUTATION Right 10/13/2017   Procedure: RIGHT BELOW KNEE AMPUTATION;  Surgeon: [name], MD;  Location: [facility];  Service: Orthopedics;  Laterality: Right;  . AMPUTATION Right 11/24/2017   Procedure: AMPUTATION BELOW KNEE REVISION;  Surgeon: [name], MD;  Location: [place];  Service: Orthopedics;  Laterality: Right;  . NO PAST SURGERIES      There were no vitals filed for this visit.  Subjective Assessment - 07/22/18 1454    Subjective  No new complaitns. No falls. Walking "some" at home.    Patient is accompained by:  Family member    Pertinent History  R TTA, DM2, HTN, peripheral neuropathy, acute renal failure    Limitations  Lifting;Standing;Walking;House hold activities    Patient  Stated Goals  To use prosthesis to walk in home & community,     Currently in Pain?  Yes    Pain Score  6     Pain Location  Leg   tibial area   Pain Orientation  Right    Pain Descriptors / Indicators  Pressure    Pain Type  Chronic pain;Neuropathic pain    Pain Onset  More than a month ago    Pain Frequency  Intermittent    Aggravating Factors   weight bearing on prosthesis    Pain Relieving Factors  unknown          [facility] Adult PT Treatment/Exercise - 07/22/18 1458      Transfers   Transfers  Sit to Stand;Stand to Sit    Sit to Stand  5: Supervision;With upper extremity assist;From chair/3-in-1    Stand to Sit  5: Supervision;With upper extremity assist;With armrests;To chair/3-in-1      Ambulation/Gait   Ambulation/Gait  Yes    Ambulation/Gait Assistance  5: Supervision;4: Min assist;4: Min guard    Ambulation/Gait Assistance Details  cues for use of RW on gravel, grass, rubber mulch and uneven pavement. cues needed with all gait for posture, step length and weight shifting with gait.     Ambulation Distance (Feet)  200 Feet   x1 in/outdoors, plus around gym with activity

## 2018-07-27 ENCOUNTER — Encounter: Payer: Self-pay | Admitting: Physical Therapy

## 2018-07-27 ENCOUNTER — Ambulatory Visit: Payer: Medicaid Other | Attending: Internal Medicine | Admitting: Physical Therapy

## 2018-07-27 DIAGNOSIS — R2681 Unsteadiness on feet: Secondary | ICD-10-CM | POA: Insufficient documentation

## 2018-07-27 DIAGNOSIS — M6281 Muscle weakness (generalized): Secondary | ICD-10-CM | POA: Diagnosis present

## 2018-07-27 DIAGNOSIS — R2689 Other abnormalities of gait and mobility: Secondary | ICD-10-CM | POA: Diagnosis present

## 2018-07-27 DIAGNOSIS — M25662 Stiffness of left knee, not elsewhere classified: Secondary | ICD-10-CM | POA: Insufficient documentation

## 2018-07-27 DIAGNOSIS — R296 Repeated falls: Secondary | ICD-10-CM | POA: Diagnosis present

## 2018-07-27 DIAGNOSIS — M25661 Stiffness of right knee, not elsewhere classified: Secondary | ICD-10-CM | POA: Insufficient documentation

## 2018-07-27 DIAGNOSIS — R293 Abnormal posture: Secondary | ICD-10-CM | POA: Diagnosis present

## 2018-07-27 NOTE — Therapy (Signed)
[facility] [address] [place], [place], [address] Phone: [phone]   Fax:  [phone]  Physical Therapy Treatment  Patient Details  Name: [name] [name] MRN: [id number] Date of Birth: 08-20-1971 Referring Provider: Meridee Score, MD   Encounter Date: 07/27/2018  PT End of Session - 07/27/18 2147    Visit Number  11    Number of Visits  27    Authorization Type  Medicaid    Authorization Time Period  3 visits from 7/12 through 06/23/18; 12 additional visits authorized from 06/24/18- 08/04/18.    Authorization - Visit Number  10    Authorization - Number of Visits  15    PT Start Time  1400    PT Stop Time  1445    PT Time Calculation (min)  45 min    Equipment Utilized During Treatment  Gait belt    Activity Tolerance  Patient tolerated treatment well;Patient limited by fatigue    Behavior During Therapy  WFL for tasks assessed/performed       Past Medical History:  Diagnosis Date  . ARF (acute renal failure) ([place]) 09/2017  . Diabetes mellitus without complication ([place])   . Hypertension   . Necrotizing fasciitis ([place]) 10/13/2017  . Wound dehiscence 11/24/2017    Past Surgical History:  Procedure Laterality Date  . AMPUTATION Right 10/13/2017   Procedure: RIGHT BELOW KNEE AMPUTATION;  Surgeon: [name], MD;  Location: [facility];  Service: Orthopedics;  Laterality: Right;  . AMPUTATION Right 11/24/2017   Procedure: AMPUTATION BELOW KNEE REVISION;  Surgeon: [name], MD;  Location: [facility];  Service: Orthopedics;  Laterality: Right;  . NO PAST SURGERIES      There were no vitals filed for this visit.  Subjective Assessment - 07/27/18 1400    Subjective  No falls. He is wearing prosthesis all awake hours. Not walking at home yet.     Patient is accompained by:  Family member    Pertinent History  R TTA, DM2, HTN, peripheral neuropathy, acute renal failure    Limitations  Lifting;Standing;Walking;House  hold activities    Patient Stated Goals  To use prosthesis to walk in home & community,     Currently in Pain?  Yes    Pain Score  5     Pain Location  Leg   residual limb   Pain Orientation  Right    Pain Descriptors / Indicators  Throbbing    Pain Type  Chronic pain;Neuropathic pain    Pain Onset  More than a month ago    Pain Frequency  Intermittent    Aggravating Factors   weight bearing on prosthesis    Pain Relieving Factors  unknown                       [facility] Adult PT Treatment/Exercise - 07/27/18 1400      Transfers   Transfers  Sit to Stand;Stand to Sit    Sit to Stand  5: Supervision;With upper extremity assist;From chair/3-in-1   to RW for stabilization   Sit to Stand Details  Visual cues/gestures for sequencing;Verbal cues for technique    Stand to Sit  5: Supervision;With upper extremity assist;To chair/3-in-1   from RW   Stand to Sit Details (indicate cue type and reason)  Visual cues/gestures for sequencing;Verbal cues for technique      Ambulation/Gait   Ambulation/Gait  Yes    Ambulation/Gait Assistance  Target Date  09/22/18            Plan - 07/27/18 2148    Clinical Impression Statement  Patient appears safe to initiate gait in home with RW & prosthesis. For community he needs supervision & his mother verbalizes understanding of assisting. Patient seems to understand PT recommendations to increase activity level.     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, left foot wounds with neuropathy    PT Frequency  2x / week   1x/wk for 3 weeks, then 2x/wk for 12 weeks   PT Duration  Other (comment)   15 weeks total for 27 visits permitted with Medicaid   PT Treatment/Interventions  ADLs/Self Care Home Management;Canalith Repostioning;DME Instruction;Gait training;Stair training;Functional mobility training;Therapeutic activities;Therapeutic exercise;Balance training;Neuromuscular re-education;Patient/family education;Prosthetic Training;Orthotic Fit/Training;Manual techniques;Vestibular    PT Next Visit Plan  need to submit for date extension next week.  continue with stair instruction, ramp/curbs with RW. increase wear & activity level. level gait with straight cane; balance activities with emphasis on decreased UE reliance    Consulted and Agree with Plan of Care  Patient;Family member/caregiver    Family Member  Consulted  mother       Patient will benefit from skilled therapeutic intervention in order to improve the following deficits and impairments:  Abnormal gait, Decreased activity tolerance, Decreased balance, Decreased coordination, Decreased endurance, Decreased knowledge of use of DME, Decreased mobility, Decreased range of motion, Decreased skin integrity, Decreased strength, Dizziness, Difficulty walking, Increased edema, Impaired flexibility, Impaired sensation, Postural dysfunction, Pain, Prosthetic Dependency  Visit Diagnosis: Muscle weakness (generalized)  Unsteadiness on feet  Other abnormalities of gait and mobility  Abnormal posture     Problem List Patient Active Problem List   Diagnosis Date Noted  . 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 Non-pressure chronic ulcer of other part of left foot limited to breakdown of skin ([place]) 02/02/2018  . 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 History of right below knee amputation ([place]) 12/22/2017  . Fever   . Acute posthemorrhagic anemia   . Leukocytosis   . Falls 11/24/2017  . Benign essential HTN 11/24/2017  . Normocytic normochromic anemia 11/24/2017  . Sepsis ([place]) 10/11/2017  . Uncontrolled type 2 diabetes mellitus with hyperglycemia ([place]) 10/11/2017  . ARF (acute renal failure) ([place]) 10/11/2017  . Nausea & vomiting 10/11/2017    Jamey Reas PT, DPT 07/27/2018, 10:02 PM  [facility] [address] [place], [place], [address] Phone: [phone]   Fax:  [phone]  Name: [name] [name] MRN: [id number] Date of Birth: December 22, 1970  Target Date  09/22/18            Plan - 07/27/18 2148    Clinical Impression Statement  Patient appears safe to initiate gait in home with RW & prosthesis. For community he needs supervision & his mother verbalizes understanding of assisting. Patient seems to understand PT recommendations to increase activity level.     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, left foot wounds with neuropathy    PT Frequency  2x / week   1x/wk for 3 weeks, then 2x/wk for 12 weeks   PT Duration  Other (comment)   15 weeks total for 27 visits permitted with Medicaid   PT Treatment/Interventions  ADLs/Self Care Home Management;Canalith Repostioning;DME Instruction;Gait training;Stair training;Functional mobility training;Therapeutic activities;Therapeutic exercise;Balance training;Neuromuscular re-education;Patient/family education;Prosthetic Training;Orthotic Fit/Training;Manual techniques;Vestibular    PT Next Visit Plan  need to submit for date extension next week.  continue with stair instruction, ramp/curbs with RW. increase wear & activity level. level gait with straight cane; balance activities with emphasis on decreased UE reliance    Consulted and Agree with Plan of Care  Patient;Family member/caregiver    Family Member  Consulted  mother       Patient will benefit from skilled therapeutic intervention in order to improve the following deficits and impairments:  Abnormal gait, Decreased activity tolerance, Decreased balance, Decreased coordination, Decreased endurance, Decreased knowledge of use of DME, Decreased mobility, Decreased range of motion, Decreased skin integrity, Decreased strength, Dizziness, Difficulty walking, Increased edema, Impaired flexibility, Impaired sensation, Postural dysfunction, Pain, Prosthetic Dependency  Visit Diagnosis: Muscle weakness (generalized)  Unsteadiness on feet  Other abnormalities of gait and mobility  Abnormal posture     Problem List Patient Active Problem List   Diagnosis Date Noted  . 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 Non-pressure chronic ulcer of other part of left foot limited to breakdown of skin ([place]) 02/02/2018  . 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 History of right below knee amputation ([place]) 12/22/2017  . Fever   . Acute posthemorrhagic anemia   . Leukocytosis   . Falls 11/24/2017  . Benign essential HTN 11/24/2017  . Normocytic normochromic anemia 11/24/2017  . Sepsis ([place]) 10/11/2017  . Uncontrolled type 2 diabetes mellitus with hyperglycemia ([place]) 10/11/2017  . ARF (acute renal failure) ([place]) 10/11/2017  . Nausea & vomiting 10/11/2017    Jamey Reas PT, DPT 07/27/2018, 10:02 PM  [facility] [address] [place], [place], [address] Phone: [phone]   Fax:  [phone]  Name: [name] [name] MRN: [id number] Date of Birth: December 22, 1970  Target Date  09/22/18            Plan - 07/27/18 2148    Clinical Impression Statement  Patient appears safe to initiate gait in home with RW & prosthesis. For community he needs supervision & his mother verbalizes understanding of assisting. Patient seems to understand PT recommendations to increase activity level.     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, left foot wounds with neuropathy    PT Frequency  2x / week   1x/wk for 3 weeks, then 2x/wk for 12 weeks   PT Duration  Other (comment)   15 weeks total for 27 visits permitted with Medicaid   PT Treatment/Interventions  ADLs/Self Care Home Management;Canalith Repostioning;DME Instruction;Gait training;Stair training;Functional mobility training;Therapeutic activities;Therapeutic exercise;Balance training;Neuromuscular re-education;Patient/family education;Prosthetic Training;Orthotic Fit/Training;Manual techniques;Vestibular    PT Next Visit Plan  need to submit for date extension next week.  continue with stair instruction, ramp/curbs with RW. increase wear & activity level. level gait with straight cane; balance activities with emphasis on decreased UE reliance    Consulted and Agree with Plan of Care  Patient;Family member/caregiver    Family Member  Consulted  mother       Patient will benefit from skilled therapeutic intervention in order to improve the following deficits and impairments:  Abnormal gait, Decreased activity tolerance, Decreased balance, Decreased coordination, Decreased endurance, Decreased knowledge of use of DME, Decreased mobility, Decreased range of motion, Decreased skin integrity, Decreased strength, Dizziness, Difficulty walking, Increased edema, Impaired flexibility, Impaired sensation, Postural dysfunction, Pain, Prosthetic Dependency  Visit Diagnosis: Muscle weakness (generalized)  Unsteadiness on feet  Other abnormalities of gait and mobility  Abnormal posture     Problem List Patient Active Problem List   Diagnosis Date Noted  . 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 Non-pressure chronic ulcer of other part of left foot limited to breakdown of skin ([place]) 02/02/2018  . 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 History of right below knee amputation ([place]) 12/22/2017  . Fever   . Acute posthemorrhagic anemia   . Leukocytosis   . Falls 11/24/2017  . Benign essential HTN 11/24/2017  . Normocytic normochromic anemia 11/24/2017  . Sepsis ([place]) 10/11/2017  . Uncontrolled type 2 diabetes mellitus with hyperglycemia ([place]) 10/11/2017  . ARF (acute renal failure) ([place]) 10/11/2017  . Nausea & vomiting 10/11/2017    Jamey Reas PT, DPT 07/27/2018, 10:02 PM  [facility] [address] [place], [place], [address] Phone: [phone]   Fax:  [phone]  Name: [name] [name] MRN: [id number] Date of Birth: December 22, 1970

## 2018-07-28 ENCOUNTER — Ambulatory Visit: Payer: Medicaid Other | Admitting: Physical Therapy

## 2018-07-28 ENCOUNTER — Encounter: Payer: Self-pay | Admitting: Physical Therapy

## 2018-07-28 DIAGNOSIS — M6281 Muscle weakness (generalized): Secondary | ICD-10-CM | POA: Diagnosis not present

## 2018-07-28 DIAGNOSIS — M25662 Stiffness of left knee, not elsewhere classified: Secondary | ICD-10-CM

## 2018-07-28 DIAGNOSIS — M25661 Stiffness of right knee, not elsewhere classified: Secondary | ICD-10-CM

## 2018-07-28 DIAGNOSIS — R296 Repeated falls: Secondary | ICD-10-CM

## 2018-07-28 DIAGNOSIS — R293 Abnormal posture: Secondary | ICD-10-CM

## 2018-07-28 DIAGNOSIS — R2681 Unsteadiness on feet: Secondary | ICD-10-CM

## 2018-07-28 DIAGNOSIS — R2689 Other abnormalities of gait and mobility: Secondary | ICD-10-CM

## 2018-07-29 ENCOUNTER — Encounter: Payer: Medicaid Other | Admitting: Physical Therapy

## 2018-07-29 NOTE — Therapy (Signed)
DME, Decreased mobility, Decreased range of motion, Decreased skin integrity, Decreased strength, Dizziness, Difficulty walking, Increased edema, Impaired flexibility, Impaired sensation, Postural dysfunction, Pain, Prosthetic Dependency  Visit Diagnosis: Muscle weakness (generalized)  Unsteadiness on feet  Other abnormalities of gait and mobility  Abnormal posture  Repeated falls  Stiffness of right knee, not elsewhere classified  Stiffness of left knee, not elsewhere classified     Problem List Patient Active Problem List   Diagnosis Date Noted  . 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 Non-pressure chronic ulcer of other part of left foot limited to breakdown of skin ([place]) 02/02/2018  . 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 History of right below knee amputation ([place]) 12/22/2017  . Fever   . Acute posthemorrhagic anemia   . Leukocytosis   . Falls 11/24/2017  . Benign essential HTN 11/24/2017  . Normocytic normochromic anemia 11/24/2017  . Sepsis ([place]) 10/11/2017  . Uncontrolled type 2 diabetes mellitus with hyperglycemia ([place]) 10/11/2017  . ARF (acute renal failure) ([place]) 10/11/2017  . Nausea & vomiting 10/11/2017   Floreen Comber, SPT 07/28/2018, 3:30 PM  Creed Kail PT, DPT 07/29/2018, 11:45 AM  [facility] [address] [place], [place],  [address] Phone: [phone]   Fax:  [phone]  Name: [name] [name] MRN: [id number] Date of Birth: 1971/08/02  DME, Decreased mobility, Decreased range of motion, Decreased skin integrity, Decreased strength, Dizziness, Difficulty walking, Increased edema, Impaired flexibility, Impaired sensation, Postural dysfunction, Pain, Prosthetic Dependency  Visit Diagnosis: Muscle weakness (generalized)  Unsteadiness on feet  Other abnormalities of gait and mobility  Abnormal posture  Repeated falls  Stiffness of right knee, not elsewhere classified  Stiffness of left knee, not elsewhere classified     Problem List Patient Active Problem List   Diagnosis Date Noted  . 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 Non-pressure chronic ulcer of other part of left foot limited to breakdown of skin ([place]) 02/02/2018  . 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 History of right below knee amputation ([place]) 12/22/2017  . Fever   . Acute posthemorrhagic anemia   . Leukocytosis   . Falls 11/24/2017  . Benign essential HTN 11/24/2017  . Normocytic normochromic anemia 11/24/2017  . Sepsis ([place]) 10/11/2017  . Uncontrolled type 2 diabetes mellitus with hyperglycemia ([place]) 10/11/2017  . ARF (acute renal failure) ([place]) 10/11/2017  . Nausea & vomiting 10/11/2017   Floreen Comber, SPT 07/28/2018, 3:30 PM  Creed Kail PT, DPT 07/29/2018, 11:45 AM  [facility] [address] [place], [place],  [address] Phone: [phone]   Fax:  [phone]  Name: [name] [name] MRN: [id number] Date of Birth: 1971/08/02  DME, Decreased mobility, Decreased range of motion, Decreased skin integrity, Decreased strength, Dizziness, Difficulty walking, Increased edema, Impaired flexibility, Impaired sensation, Postural dysfunction, Pain, Prosthetic Dependency  Visit Diagnosis: Muscle weakness (generalized)  Unsteadiness on feet  Other abnormalities of gait and mobility  Abnormal posture  Repeated falls  Stiffness of right knee, not elsewhere classified  Stiffness of left knee, not elsewhere classified     Problem List Patient Active Problem List   Diagnosis Date Noted  . 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 Non-pressure chronic ulcer of other part of left foot limited to breakdown of skin ([place]) 02/02/2018  . 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 History of right below knee amputation ([place]) 12/22/2017  . Fever   . Acute posthemorrhagic anemia   . Leukocytosis   . Falls 11/24/2017  . Benign essential HTN 11/24/2017  . Normocytic normochromic anemia 11/24/2017  . Sepsis ([place]) 10/11/2017  . Uncontrolled type 2 diabetes mellitus with hyperglycemia ([place]) 10/11/2017  . ARF (acute renal failure) ([place]) 10/11/2017  . Nausea & vomiting 10/11/2017   Floreen Comber, SPT 07/28/2018, 3:30 PM  Creed Kail PT, DPT 07/29/2018, 11:45 AM  [facility] [address] [place], [place],  [address] Phone: [phone]   Fax:  [phone]  Name: [name] [name] MRN: [id number] Date of Birth: 1971/08/02  This entire session of physical therapy was performed under the direct supervision of PT signing evaluation /treatment. PT reviewed note and agrees. Jamey Reas, PT, [facility] [address] [place], [place], [address] Phone: [phone]   Fax:  [phone]  Physical Therapy Treatment  Patient Details  Name: [name] [name] MRN: [id number] Date of Birth: 04/28/1971 Referring Provider: Meridee Score, MD   Encounter Date: 07/28/2018  PT End of Session - 07/28/18 1529    Visit Number  12    Number of Visits  27    Authorization Type  Medicaid    Authorization Time Period  3 visits from 7/12 through 06/23/18; 12 additional visits authorized from 06/24/18- 08/04/18.    Authorization - Visit Number  11    Authorization - Number of Visits  15    PT Start Time  4580    PT Stop Time  1530    PT Time Calculation (min)  43 min    Equipment Utilized During Treatment  Gait belt    Activity Tolerance  Patient tolerated treatment well    Behavior During Therapy  WFL for tasks assessed/performed       Past Medical History:  Diagnosis Date  . ARF (acute renal failure) ([place]) 09/2017  . Diabetes mellitus without complication ([place])   . Hypertension   . Necrotizing fasciitis ([place]) 10/13/2017  . Wound dehiscence 11/24/2017    Past Surgical History:  Procedure Laterality Date  . AMPUTATION Right 10/13/2017   Procedure: RIGHT BELOW KNEE AMPUTATION;  Surgeon: [name], MD;  Location: [facility];  Service: Orthopedics;  Laterality: Right;  . AMPUTATION Right 11/24/2017   Procedure: AMPUTATION BELOW KNEE REVISION;  Surgeon: [name], MD;  Location: [facility];  Service: Orthopedics;  Laterality: Right;  . NO PAST SURGERIES      There were no vitals filed for this visit.  Subjective Assessment - 07/28/18 1451    Subjective  He walked in home with walker some. He was tired but not bad.     Patient is accompained by:  Family member     Pertinent History  R TTA, DM2, HTN, peripheral neuropathy, acute renal failure    Limitations  Lifting;Standing;Walking;House hold activities    Patient Stated Goals  To use prosthesis to walk in home & community,     Currently in Pain?  Yes    Pain Score  5     Pain Location  Leg    Pain Orientation  Right    Pain Descriptors / Indicators  Throbbing    Pain Type  Neuropathic pain    Pain Onset  More than a month ago    Pain Frequency  Intermittent    Aggravating Factors   weight bearing on prosthesis    Pain Relieving Factors  sitting helps to alleviate pain but does not alleviate 100%     Multiple Pain Sites  No                       [facility] Adult PT Treatment/Exercise - 07/28/18 1456      Transfers   Transfers  Sit to Stand;Stand to Sit    Sit to Stand  5: Supervision;4: Min assist;With upper extremity assist;With armrests;From chair/3-in-1   MinA to stabilize with cane, chairs without armrests to RW   Sit to Stand Details (indicate cue type and reason)  demo & verbal cues on technique with chairs without  This entire session of physical therapy was performed under the direct supervision of PT signing evaluation /treatment. PT reviewed note and agrees. Jamey Reas, PT, [facility] [address] [place], [place], [address] Phone: [phone]   Fax:  [phone]  Physical Therapy Treatment  Patient Details  Name: [name] [name] MRN: [id number] Date of Birth: 04/28/1971 Referring Provider: Meridee Score, MD   Encounter Date: 07/28/2018  PT End of Session - 07/28/18 1529    Visit Number  12    Number of Visits  27    Authorization Type  Medicaid    Authorization Time Period  3 visits from 7/12 through 06/23/18; 12 additional visits authorized from 06/24/18- 08/04/18.    Authorization - Visit Number  11    Authorization - Number of Visits  15    PT Start Time  4580    PT Stop Time  1530    PT Time Calculation (min)  43 min    Equipment Utilized During Treatment  Gait belt    Activity Tolerance  Patient tolerated treatment well    Behavior During Therapy  WFL for tasks assessed/performed       Past Medical History:  Diagnosis Date  . ARF (acute renal failure) ([place]) 09/2017  . Diabetes mellitus without complication ([place])   . Hypertension   . Necrotizing fasciitis ([place]) 10/13/2017  . Wound dehiscence 11/24/2017    Past Surgical History:  Procedure Laterality Date  . AMPUTATION Right 10/13/2017   Procedure: RIGHT BELOW KNEE AMPUTATION;  Surgeon: [name], MD;  Location: [facility];  Service: Orthopedics;  Laterality: Right;  . AMPUTATION Right 11/24/2017   Procedure: AMPUTATION BELOW KNEE REVISION;  Surgeon: [name], MD;  Location: [facility];  Service: Orthopedics;  Laterality: Right;  . NO PAST SURGERIES      There were no vitals filed for this visit.  Subjective Assessment - 07/28/18 1451    Subjective  He walked in home with walker some. He was tired but not bad.     Patient is accompained by:  Family member     Pertinent History  R TTA, DM2, HTN, peripheral neuropathy, acute renal failure    Limitations  Lifting;Standing;Walking;House hold activities    Patient Stated Goals  To use prosthesis to walk in home & community,     Currently in Pain?  Yes    Pain Score  5     Pain Location  Leg    Pain Orientation  Right    Pain Descriptors / Indicators  Throbbing    Pain Type  Neuropathic pain    Pain Onset  More than a month ago    Pain Frequency  Intermittent    Aggravating Factors   weight bearing on prosthesis    Pain Relieving Factors  sitting helps to alleviate pain but does not alleviate 100%     Multiple Pain Sites  No                       [facility] Adult PT Treatment/Exercise - 07/28/18 1456      Transfers   Transfers  Sit to Stand;Stand to Sit    Sit to Stand  5: Supervision;4: Min assist;With upper extremity assist;With armrests;From chair/3-in-1   MinA to stabilize with cane, chairs without armrests to RW   Sit to Stand Details (indicate cue type and reason)  demo & verbal cues on technique with chairs without

## 2018-08-01 ENCOUNTER — Encounter: Payer: Self-pay | Admitting: Physical Therapy

## 2018-08-01 ENCOUNTER — Ambulatory Visit: Payer: Medicaid Other | Admitting: Physical Therapy

## 2018-08-01 DIAGNOSIS — R2689 Other abnormalities of gait and mobility: Secondary | ICD-10-CM

## 2018-08-01 DIAGNOSIS — M6281 Muscle weakness (generalized): Secondary | ICD-10-CM

## 2018-08-01 DIAGNOSIS — R2681 Unsteadiness on feet: Secondary | ICD-10-CM

## 2018-08-01 DIAGNOSIS — R293 Abnormal posture: Secondary | ICD-10-CM

## 2018-08-02 NOTE — Therapy (Signed)
[facility] [address] [place], [place], [address] Phone: [phone]   Fax:  [phone]  Physical Therapy Treatment  Patient Details  Name: [name] [name] MRN: [id number] Date of Birth: 06-23-1971 Referring Provider: Meridee Score, MD   Encounter Date: 08/01/2018  PT End of Session - 08/01/18 2320    Visit Number  13    Number of Visits  27    Authorization Type  Medicaid    Authorization Time Period  3 visits from 7/12 through 06/23/18; 12 additional visits authorized from 06/24/18- 08/04/18.    Authorization - Visit Number  12    Authorization - Number of Visits  15    PT Start Time  1324    PT Stop Time  1530    PT Time Calculation (min)  45 min    Equipment Utilized During Treatment  Gait belt    Activity Tolerance  Patient tolerated treatment well    Behavior During Therapy  WFL for tasks assessed/performed       Past Medical History:  Diagnosis Date  . ARF (acute renal failure) ([place]) 09/2017  . Diabetes mellitus without complication ([place])   . Hypertension   . Necrotizing fasciitis ([place]) 10/13/2017  . Wound dehiscence 11/24/2017    Past Surgical History:  Procedure Laterality Date  . AMPUTATION Right 10/13/2017   Procedure: RIGHT BELOW KNEE AMPUTATION;  Surgeon: [name], MD;  Location: [facility];  Service: Orthopedics;  Laterality: Right;  . AMPUTATION Right 11/24/2017   Procedure: AMPUTATION BELOW KNEE REVISION;  Surgeon: [name], MD;  Location: [facility];  Service: Orthopedics;  Laterality: Right;  . NO PAST SURGERIES      There were no vitals filed for this visit.  Subjective Assessment - 08/01/18 1450    Subjective  He walked into PT with RW & prosthesis for first time.     Patient is accompained by:  Family member    Pertinent History  R TTA, DM2, HTN, peripheral neuropathy, acute renal failure    Limitations  Lifting;Standing;Walking;House hold activities    Patient Stated Goals  To use  prosthesis to walk in home & community,     Currently in Pain?  Yes    Pain Score  6     Pain Location  Leg    Pain Orientation  Right    Pain Descriptors / Indicators  Throbbing    Pain Type  Other (Comment)   Vascular pain   Pain Onset  More than a month ago    Pain Frequency  Intermittent    Aggravating Factors   weight bearing on prosthesis    Pain Relieving Factors  sitting helps to alleviate pain but not 100%                       [facility] Adult PT Treatment/Exercise - 08/01/18 1445      Transfers   Transfers  Sit to Stand;Stand to Sit    Sit to Stand  5: Supervision;With upper extremity assist;With armrests;From chair/3-in-1   chairs without armrests & simulated couch to RW   Stand to Sit  5: Supervision;With upper extremity assist;With armrests;To chair/3-in-1   chairs without armrests from [place]     Ambulation/Gait   Ambulation/Gait  Yes    Ambulation/Gait Assistance  4: Min guard;5: Supervision;4: Min assist   MinA cane, Rollator Min guard on grass   Ambulation/Gait Assistance Details  verbal cues on rollator  [facility] [address] [place], [place], [address] Phone: [phone]   Fax:  [phone]  Physical Therapy Treatment  Patient Details  Name: [name] [name] MRN: [id number] Date of Birth: 06-23-1971 Referring Provider: Meridee Score, MD   Encounter Date: 08/01/2018  PT End of Session - 08/01/18 2320    Visit Number  13    Number of Visits  27    Authorization Type  Medicaid    Authorization Time Period  3 visits from 7/12 through 06/23/18; 12 additional visits authorized from 06/24/18- 08/04/18.    Authorization - Visit Number  12    Authorization - Number of Visits  15    PT Start Time  1324    PT Stop Time  1530    PT Time Calculation (min)  45 min    Equipment Utilized During Treatment  Gait belt    Activity Tolerance  Patient tolerated treatment well    Behavior During Therapy  WFL for tasks assessed/performed       Past Medical History:  Diagnosis Date  . ARF (acute renal failure) ([place]) 09/2017  . Diabetes mellitus without complication ([place])   . Hypertension   . Necrotizing fasciitis ([place]) 10/13/2017  . Wound dehiscence 11/24/2017    Past Surgical History:  Procedure Laterality Date  . AMPUTATION Right 10/13/2017   Procedure: RIGHT BELOW KNEE AMPUTATION;  Surgeon: [name], MD;  Location: [facility];  Service: Orthopedics;  Laterality: Right;  . AMPUTATION Right 11/24/2017   Procedure: AMPUTATION BELOW KNEE REVISION;  Surgeon: [name], MD;  Location: [facility];  Service: Orthopedics;  Laterality: Right;  . NO PAST SURGERIES      There were no vitals filed for this visit.  Subjective Assessment - 08/01/18 1450    Subjective  He walked into PT with RW & prosthesis for first time.     Patient is accompained by:  Family member    Pertinent History  R TTA, DM2, HTN, peripheral neuropathy, acute renal failure    Limitations  Lifting;Standing;Walking;House hold activities    Patient Stated Goals  To use  prosthesis to walk in home & community,     Currently in Pain?  Yes    Pain Score  6     Pain Location  Leg    Pain Orientation  Right    Pain Descriptors / Indicators  Throbbing    Pain Type  Other (Comment)   Vascular pain   Pain Onset  More than a month ago    Pain Frequency  Intermittent    Aggravating Factors   weight bearing on prosthesis    Pain Relieving Factors  sitting helps to alleviate pain but not 100%                       [facility] Adult PT Treatment/Exercise - 08/01/18 1445      Transfers   Transfers  Sit to Stand;Stand to Sit    Sit to Stand  5: Supervision;With upper extremity assist;With armrests;From chair/3-in-1   chairs without armrests & simulated couch to RW   Stand to Sit  5: Supervision;With upper extremity assist;With armrests;To chair/3-in-1   chairs without armrests from [place]     Ambulation/Gait   Ambulation/Gait  Yes    Ambulation/Gait Assistance  4: Min guard;5: Supervision;4: Min assist   MinA cane, Rollator Min guard on grass   Ambulation/Gait Assistance Details  verbal cues on rollator  [place] Date  09/30/18      PT LONG TERM GOAL #8   Title  Patient ambulates around household furniture with cane & prosthesis carrying plate or cup safely modified independent.     Baseline  08/01/2018 Patient ambulates straight open pathes 60' with cane & prosthesis with mod to min assist.     Time  7    Period  Weeks    Status  New    Target Date  09/30/18            Plan - 08/02/18 1740    Clinical Impression Statement  Patient is making good progress with skilled instruction for functions & use of his prosthesis. With his diabetes & complex medical issues, he requires further skilled intervention to meet his current potenital of prosthetic gait in community with rollator walker and in home with cane along with wearing prosthesis most of awake hours safely.      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, left foot wounds with neuropathy    PT Frequency  2x / week   1x/wk for 3 weeks, then 2x/wk for 12 weeks   PT Duration  Other (comment)   15 weeks total for 27 visits permitted with Medicaid   PT Treatment/Interventions  ADLs/Self Care Home Management;Canalith  Repostioning;DME Instruction;Gait training;Stair training;Functional mobility training;Therapeutic activities;Therapeutic exercise;Balance training;Neuromuscular re-education;Patient/family education;Prosthetic Training;Orthotic Fit/Training;Manual techniques;Vestibular    PT Next Visit Plan  Assess standing balance tasks of [place] with RW support and complete baseline section of that LTG. Submit to Medicaid for 12 more visits.  Call [facility] to inquire about tall adult (he is 6'5") rollator walkers covered by Medicaid.      Consulted and Agree with Plan of Care  Patient;Family member/caregiver    Family Member Consulted  mother       Patient will benefit from skilled therapeutic intervention in order to improve the following deficits and impairments:  Abnormal gait, Decreased activity tolerance, Decreased balance, Decreased coordination, Decreased endurance, Decreased knowledge of use of DME, Decreased mobility, Decreased range of motion, Decreased skin integrity, Decreased strength, Dizziness, Difficulty walking, Increased edema, Impaired flexibility, Impaired sensation, Postural dysfunction, Pain, Prosthetic Dependency  Visit Diagnosis: Muscle weakness (generalized)  Unsteadiness on feet  Other abnormalities of gait and mobility  Abnormal posture     Problem List Patient Active Problem List   Diagnosis Date Noted  . 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 Non-pressure chronic ulcer of other part of left foot limited to breakdown of skin ([facility]) 02/02/2018  . 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 History of right below knee amputation ([place]) 12/22/2017  . Fever   . Acute posthemorrhagic anemia   . Leukocytosis   . Falls 11/24/2017  . Benign essential HTN 11/24/2017  . Normocytic normochromic anemia 11/24/2017  . Sepsis ([place]) 10/11/2017  . Uncontrolled type 2 diabetes mellitus with hyperglycemia ([place]) 10/11/2017  . ARF (acute renal failure) ([place])  10/11/2017  . Nausea & vomiting 10/11/2017    Jamey Reas  PT, DPT 08/02/2018, 5:44 PM  [facility] [address] [place], [place], [address] Phone: [phone]   Fax:  [phone]  Name: [name] [name] MRN: [id number] Date of Birth: 1971/03/11  Target Date  09/30/18      PT LONG TERM GOAL #8   Title  Patient ambulates around household furniture with cane & prosthesis carrying plate or cup safely modified independent.     Baseline  08/01/2018 Patient ambulates straight open pathes 60' with cane & prosthesis with mod to min assist.     Time  7    Period  Weeks    Status  New    Target Date  09/30/18            Plan - 08/02/18 1740    Clinical Impression Statement  Patient is making good progress with skilled instruction for functions & use of his prosthesis. With his diabetes & complex medical issues, he requires further skilled intervention to meet his current potenital of prosthetic gait in community with rollator walker and in home with cane along with wearing prosthesis most of awake hours safely.      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, left foot wounds with neuropathy    PT Frequency  2x / week   1x/wk for 3 weeks, then 2x/wk for 12 weeks   PT Duration  Other (comment)   15 weeks total for 27 visits permitted with Medicaid   PT Treatment/Interventions  ADLs/Self Care Home Management;Canalith  Repostioning;DME Instruction;Gait training;Stair training;Functional mobility training;Therapeutic activities;Therapeutic exercise;Balance training;Neuromuscular re-education;Patient/family education;Prosthetic Training;Orthotic Fit/Training;Manual techniques;Vestibular    PT Next Visit Plan  Assess standing balance tasks of [place] with RW support and complete baseline section of that LTG. Submit to Medicaid for 12 more visits.  Call [facility] to inquire about tall adult (he is 6'5") rollator walkers covered by Medicaid.      Consulted and Agree with Plan of Care  Patient;Family member/caregiver    Family Member Consulted  mother       Patient will benefit from skilled therapeutic intervention in order to improve the following deficits and impairments:  Abnormal gait, Decreased activity tolerance, Decreased balance, Decreased coordination, Decreased endurance, Decreased knowledge of use of DME, Decreased mobility, Decreased range of motion, Decreased skin integrity, Decreased strength, Dizziness, Difficulty walking, Increased edema, Impaired flexibility, Impaired sensation, Postural dysfunction, Pain, Prosthetic Dependency  Visit Diagnosis: Muscle weakness (generalized)  Unsteadiness on feet  Other abnormalities of gait and mobility  Abnormal posture     Problem List Patient Active Problem List   Diagnosis Date Noted  . 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 Non-pressure chronic ulcer of other part of left foot limited to breakdown of skin ([facility]) 02/02/2018  . 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 History of right below knee amputation ([facility]) 12/22/2017  . Fever   . Acute posthemorrhagic anemia   . Leukocytosis   . Falls 11/24/2017  . Benign essential HTN 11/24/2017  . Normocytic normochromic anemia 11/24/2017  . Sepsis ([place]) 10/11/2017  . Uncontrolled type 2 diabetes mellitus with hyperglycemia ([facility]) 10/11/2017  . ARF (acute renal failure) ([facility])  10/11/2017  . Nausea & vomiting 10/11/2017    Jamey Reas  PT, DPT 08/02/2018, 5:44 PM  [facility] [address] [place], [place], [address] Phone: [phone]   Fax:  [phone]  Name: [name] [name] MRN: [id number] Date of Birth: 1971/03/11

## 2018-08-04 ENCOUNTER — Ambulatory Visit: Payer: Medicaid Other | Admitting: Physical Therapy

## 2018-08-04 DIAGNOSIS — R293 Abnormal posture: Secondary | ICD-10-CM

## 2018-08-04 DIAGNOSIS — R2681 Unsteadiness on feet: Secondary | ICD-10-CM

## 2018-08-04 DIAGNOSIS — R2689 Other abnormalities of gait and mobility: Secondary | ICD-10-CM

## 2018-08-04 DIAGNOSIS — M6281 Muscle weakness (generalized): Secondary | ICD-10-CM

## 2018-08-04 NOTE — Therapy (Signed)
[facility] [address] [place], [place], [address] Phone: [phone]   Fax:  [phone]  Physical Therapy Treatment  Patient Details  Name: [name] [name] MRN: [id number] Date of Birth: Jul 14, 1971 Referring Provider: Meridee Score, MD   Encounter Date: 08/04/2018  PT End of Session - 08/04/18 1548    Visit Number  14    Number of Visits  27    Authorization Type  Medicaid    Authorization Time Period  3 visits from 7/12 through 06/23/18; 12 additional visits authorized from 06/24/18- 08/04/18.    Authorization - Visit Number  12    Authorization - Number of Visits  15    PT Start Time  1017    PT Stop Time  1528    PT Time Calculation (min)  43 min    Equipment Utilized During Treatment  Gait belt    Activity Tolerance  Patient tolerated treatment well    Behavior During Therapy  WFL for tasks assessed/performed       Past Medical History:  Diagnosis Date  . ARF (acute renal failure) ([facility]) 09/2017  . Diabetes mellitus without complication ([facility])   . Hypertension   . Necrotizing fasciitis ([facility]) 10/13/2017  . Wound dehiscence 11/24/2017    Past Surgical History:  Procedure Laterality Date  . AMPUTATION Right 10/13/2017   Procedure: RIGHT BELOW KNEE AMPUTATION;  Surgeon: [name], MD;  Location: [facility];  Service: Orthopedics;  Laterality: Right;  . AMPUTATION Right 11/24/2017   Procedure: AMPUTATION BELOW KNEE REVISION;  Surgeon: [name], MD;  Location: [facility];  Service: Orthopedics;  Laterality: Right;  . NO PAST SURGERIES      There were no vitals filed for this visit.  Subjective Assessment - 08/04/18 1458    Subjective  No falls to report    Currently in Pain?  Yes    Pain Score  6     Pain Location  Leg    Pain Orientation  Right    Pain Descriptors / Indicators  Throbbing    Pain Type  Other (Comment)   vascular pain   Pain Onset  More than a month ago    Pain Frequency  Intermittent    Aggravating Factors   weight bearin on prosthesis    Pain Relieving Factors  sitting helps    Multiple Pain Sites  No                       [facility] Adult PT Treatment/Exercise - 08/04/18 1456      Ambulation/Gait   Ambulation/Gait  Yes    Ambulation/Gait Assistance  4: Min guard    Ambulation/Gait Assistance Details  VC's for posture, proper use/placement of AD (cane), and posture. 120' indoors with RW, SBA for safety; 333' outdoors with rollator min guard, 120 ft and 100'x2 with quad tip cane indoors, min assist/guard or safety.     Ambulation Distance (Feet)  115 Feet   see above   Assistive device  Rollator;Prosthesis;Straight cane;Rolling walker   Cane with quad tip   Gait Pattern  Decreased stance time - right;Step-through pattern;Decreased arm swing - right;Decreased trunk rotation    Ambulation Surface  Level;Unlevel;Indoor;Outdoor;Paved    Curb  4: Min assist    Curb Details (indicate cue type and reason)  Pt ascended/descended curb with rollator walker, cues for proper technique, use of AD.       Therapeutic Activites  Therapeutic Activities  Other Therapeutic Activities    Other Therapeutic Activities  With RW for support: pt picked up item, reached forward 10", performed simulated dressing of LE's successfully.       Prosthetics   Current prosthetic wear tolerance (days/week)   daily    Current prosthetic wear tolerance (#hours/day)   most awake hours    Residual limb condition   intact with redness at patella and popliteal fold area    Education Provided  Skin check;Residual limb care    Person(s) Educated  Patient;Parent(s)    Education Method  Verbal cues    Education Method  Verbalized understanding;Verbal cues required    Donning Prosthesis  Supervision    Doffing Prosthesis  Modified independent (device/increased time)               PT Short Term Goals - 08/01/18 1724      PT SHORT TERM GOAL #1   Title  Patient initiates adjusting ply  socks with volume changes with PT guidance on correct number of ply fit. (All STGs Target Date: 07/29/18)    Baseline  MET 08/01/2018  Pt initiates thought to adjust ply socks when fit is not correct with limb volume changes but needs PT guidance with how much to change.     Time  4    Period  Weeks    Status  Achieved      PT SHORT TERM GOAL #2   Title  Patient tolerates daily wear of prosthesis >10 hrs total /day without skin issues.     Baseline  MET 08/01/2018 Patient wear prosthesis 12 hrs /day no skin breakdown but has redness over patella & popliteal areas.    Time  4    Period  Weeks    Status  Achieved      PT SHORT TERM GOAL #3   Title  Patient performs standing balance without UE support looking to side, overhead & downward with supervision.     Baseline  MET 08/01/2018 Patient is able to stand without UE support & scan environment but requires close supervision due to unsteadiness.     Time  4    Period  Weeks    Status  Achieved      PT SHORT TERM GOAL #4   Title  Patient ambulates 100' with rolling walker & prosthesis with supervision.    Baseline  MET 08/01/2018 Patient ambulates up to 150' with RW or rollator walker & prosthesis with supervision.     Time  4    Period  Weeks    Status  Achieved      PT SHORT TERM GOAL #5   Title  Patient negotiates ramps & curbs with RW  and stairs with 2 rails & prosthesis with supervision.     Baseline  MET 08/01/2018  Patient negotiates ramps & curbs with RW with supervision and stairs with 2 rails with supervision.     Time  4    Period  Weeks    Status  Achieved        PT Long Term Goals - 08/04/18 1500      PT LONG TERM GOAL #1   Title  Patient verbalizes & demonstrates understanding of proper prosthetic care to enable safe use of prosthesis.     Baseline  08/01/2018 Patient requires skilled PT cues on residual limb care & how much to adjust ply socks when his volume changes.     Time  15  Period  Weeks    Status  On-going       PT LONG TERM GOAL #2   Title  Patient tolerates daily prothesis wear >90% of awake hours without skin or limb pain issues.     Baseline  08/01/2018 Patient is tolerating prosthesis wear >12hrs/day with no skin breakdown but redness and limb pain occassionally 4-5/10    Time  15    Period  Weeks    Status  On-going      PT LONG TERM GOAL #3   Title  Patient performs standing balance with rolling walker & prosthesis reaching 10" anteriorly, to floor, manages clothes for toileting, modified independent; task within [place] test with RW support >36/56 safely.     Baseline  08/04/18: with RW support- pt is able to retrieve item from floor, reach 10 inches forward and simulate pulling up clothing with supervision for safety.    Time  15    Period  Weeks    Status  On-going      PT LONG TERM GOAL #4   Title  Patient ambulates 300' with RW & prosthesis modified independent to enable basic community mobility.     Baseline  08/01/2018 patient ambulates up to 150' with rolling walker or rollator walker & prosthesis with supervision/skilled cues for safety & gait efficiency.     Time  15    Period  Weeks    Status  On-going      PT LONG TERM GOAL #5   Title  Patient negotiates ramps, curbs with rolling walker and stairs with 1 rail & cane with prosthesis modified independent for community access.     Baseline  08/01/2018 Patient negotiates ramps, curbs with rollator with close supervision & heavy cues for safety. He negotiates stairs with 1 rail & cane with close supervision & skilled cues.     Time  15    Period  Weeks    Status  On-going      PT LONG TERM GOAL #6   Title  Patient verbalizes understanding of fitness plan.     Baseline  08/01/2018  patient continues to need skilled instruction to progress exercises, fitness & activity tolerance.     Time  15    Period  Weeks    Status  New      PT LONG TERM GOAL #7   Title  Patient reports pain increases </= 2 increments on 0-10 scale with standing &  gait activities noted above LTGs.     Baseline  08/01/2018 Patient reports pain increases from 0-1/10 to 4-5/10 with standing & gait.     Time  15    Period  Weeks    Status  On-going      PT LONG TERM GOAL #8   Title  Patient ambulates around household furniture with cane & prosthesis carrying plate or cup safely modified independent.     Baseline  08/01/2018 Patient ambulates straight open pathes 69' with cane & prosthesis with mod to min assist.     Time  7    Period  Weeks    Status  New            Plan - 08/04/18 1550    Clinical Impression Statement  Todays skilled session continued to focus on balance while reaching, picking up objects and dressing working toward remaining unchecked LTG with goal partially met as pt continues to need supervision for balance/safety, not to the Mod I level as  yet; remainder of session contineud to focus on gait training with rollator for community use and quad tip cane for indoor for progression towards LTGs as pt is now using RW for mobility. Pt has progressed from use of wheelchair for all mobility, to use of RW/prosthesis for all mobility over the course of therapy to date. . Pt is making steady progress and would benefit from skilled therapy sessions to continue to progress towards goals with use of rollator and cane to further his functional independence.     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, left foot wounds with neuropathy    PT Frequency  2x / week   1x/wk for 3 weeks, then 2x/wk for 12 weeks   PT Duration  Other (comment)   15 weeks total for 27 visits permitted with Medicaid   PT Treatment/Interventions  ADLs/Self Care Home Management;Canalith Repostioning;DME Instruction;Gait training;Stair training;Functional mobility training;Therapeutic activities;Therapeutic exercise;Balance training;Neuromuscular re-education;Patient/family education;Prosthetic  Training;Orthotic Fit/Training;Manual techniques;Vestibular    PT Next Visit Plan Continue with use of rollator and cane with quad tip; begin to work on balance activities with decreased UE support (pt reports getting off balance when standing to remove a shirt), work on vision removed balance as well.    Consulted and Agree with Plan of Care  Patient;Family member/caregiver    Family Member Consulted  mother       Patient will benefit from skilled therapeutic intervention in order to improve the following deficits and impairments:  Abnormal gait, Decreased activity tolerance, Decreased balance, Decreased coordination, Decreased endurance, Decreased knowledge of use of DME, Decreased mobility, Decreased range of motion, Decreased skin integrity, Decreased strength, Dizziness, Difficulty walking, Increased edema, Impaired flexibility, Impaired sensation, Postural dysfunction, Pain, Prosthetic Dependency  Visit Diagnosis: Muscle weakness (generalized)  Unsteadiness on feet  Other abnormalities of gait and mobility  Abnormal posture     Problem List Patient Active Problem List   Diagnosis Date Noted  . 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 Non-pressure chronic ulcer of other part of left foot limited to breakdown of skin ([facility]) 02/02/2018  . 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 History of right below knee amputation ([facility]) 12/22/2017  . Fever   . Acute posthemorrhagic anemia   . Leukocytosis   . Falls 11/24/2017  . Benign essential HTN 11/24/2017  . Normocytic normochromic anemia 11/24/2017  . Sepsis ([facility]) 10/11/2017  . Uncontrolled type 2 diabetes mellitus with hyperglycemia ([facility]) 10/11/2017  . ARF (acute renal failure) ([facility]) 10/11/2017  . Nausea & vomiting 10/11/2017    Willow Ora, PTA, [facility] [address], [place], [place] [address] [phone] 08/04/18, 4:05 PM   Name: [name] [name] MRN: [id number] Date  of Birth: [birth year]

## 2018-08-08 ENCOUNTER — Encounter: Payer: Self-pay | Admitting: Physical Therapy

## 2018-08-08 ENCOUNTER — Other Ambulatory Visit: Payer: Self-pay | Admitting: Internal Medicine

## 2018-08-09 ENCOUNTER — Ambulatory Visit: Payer: Medicaid Other | Admitting: Physical Therapy

## 2018-08-10 ENCOUNTER — Ambulatory Visit: Payer: Medicaid Other | Admitting: Physical Therapy

## 2018-08-12 ENCOUNTER — Encounter: Payer: Self-pay | Admitting: Physical Therapy

## 2018-08-18 ENCOUNTER — Ambulatory Visit: Payer: Medicaid Other | Admitting: Physical Therapy

## 2018-08-19 ENCOUNTER — Ambulatory Visit: Payer: Medicaid Other

## 2018-08-19 DIAGNOSIS — R293 Abnormal posture: Secondary | ICD-10-CM

## 2018-08-19 DIAGNOSIS — M6281 Muscle weakness (generalized): Secondary | ICD-10-CM | POA: Diagnosis not present

## 2018-08-19 DIAGNOSIS — R2681 Unsteadiness on feet: Secondary | ICD-10-CM

## 2018-08-19 DIAGNOSIS — R2689 Other abnormalities of gait and mobility: Secondary | ICD-10-CM

## 2018-08-19 NOTE — Therapy (Signed)
[facility] [address] [place], [place], [address] Phone: [phone]   Fax:  [phone]  Physical Therapy Treatment  Patient Details  Name: [name] [name] MRN: [id number] Date of Birth: Dec 15, 1970 Referring Provider (PT): Meridee Score, MD   Encounter Date: 08/19/2018  PT End of Session - 08/19/18 1408    Visit Number  15    Number of Visits  27    Authorization Type  Medicaid    Authorization Time Period  3 visits from 7/12 through 06/23/18; 12 additional visits authorized from 06/24/18- 08/04/18; 12 additional visits authorized from 08/10/18 - 09/20/18.    Authorization - Visit Number  14    Authorization - Number of Visits  27    PT Start Time  1401    PT Stop Time  1446    PT Time Calculation (min)  45 min    Equipment Utilized During Treatment  Gait belt    Activity Tolerance  Patient tolerated treatment well    Behavior During Therapy  WFL for tasks assessed/performed       Past Medical History:  Diagnosis Date  . ARF (acute renal failure) ([facility]) 09/2017  . Diabetes mellitus without complication ([facility])   . Hypertension   . Necrotizing fasciitis ([facility]) 10/13/2017  . Wound dehiscence 11/24/2017    Past Surgical History:  Procedure Laterality Date  . AMPUTATION Right 10/13/2017   Procedure: RIGHT BELOW KNEE AMPUTATION;  Surgeon: [name], MD;  Location: [facility];  Service: Orthopedics;  Laterality: Right;  . AMPUTATION Right 11/24/2017   Procedure: AMPUTATION BELOW KNEE REVISION;  Surgeon: [name], MD;  Location: [facility];  Service: Orthopedics;  Laterality: Right;  . NO PAST SURGERIES      There were no vitals filed for this visit.  Subjective Assessment - 08/19/18 1409    Subjective  No falls to report, pt stated he almost feel walking inside from the back deck post pain meds from tooth removal but was able to catch himself. Pt had two teeth pulled yesterday and was on pain meds during treatment  session.     Pertinent History  R TTA, DM2, HTN, peripheral neuropathy, acute renal failure    Limitations  Lifting;Standing;Walking;House hold activities    Patient Stated Goals  To use prosthesis to walk in home & community,     Currently in Pain?  Yes    Pain Score  5     Pain Location  Leg   RLE   Pain Orientation  Right    Pain Descriptors / Indicators  Throbbing    Pain Type  --   vascular pain   Pain Onset  More than a month ago    Pain Frequency  Intermittent    Aggravating Factors   weight bearing on prosthesis    Pain Relieving Factors  sitting        [facility] Adult PT Treatment/Exercise - 08/19/18 1413      Ambulation/Gait   Ambulation/Gait  Yes    Ambulation/Gait Assistance  4: Min guard    Ambulation/Gait Assistance Details  VC's for posture, forward gaze and proper use of AD.     Ambulation Distance (Feet)  330 Feet    Assistive device  Prosthesis;Rollator;Other (Comment)   quad tipped cane   Gait Pattern  Decreased stance time - right;Step-through pattern;Decreased arm swing - right;Decreased trunk rotation    Ambulation Surface  Level;Indoor    Stairs  Yes  Stairs Assistance  4: Min guard    Stairs Assistance Details (indicate cue type and reason)  VC's for proper sequence when ascending/descending, placement of cane, and weightshift.     Stair Management Technique  One rail Right;Step to pattern;Forwards;With cane;Other (comment)   quad tipped cane   Number of Stairs  4    Height of Stairs  6      High Level Balance   High Level Balance Activities  Head turns    High Level Balance Comments  Corner balance with chair in front for support if required, standing with EO performing head nods/turns with some postural sway, pt able to correct without UE support, tactile feedback from therapist.         PT Short Term Goals - 08/01/18 1724      PT SHORT TERM GOAL #1   Title  Patient initiates adjusting ply socks with volume changes with PT guidance on correct  number of ply fit. (All STGs Target Date: 07/29/18)    Baseline  MET 08/01/2018  Pt initiates thought to adjust ply socks when fit is not correct with limb volume changes but needs PT guidance with how much to change.     Time  4    Period  Weeks    Status  Achieved      PT SHORT TERM GOAL #2   Title  Patient tolerates daily wear of prosthesis >10 hrs total /day without skin issues.     Baseline  MET 08/01/2018 Patient wear prosthesis 12 hrs /day no skin breakdown but has redness over patella & popliteal areas.    Time  4    Period  Weeks    Status  Achieved      PT SHORT TERM GOAL #3   Title  Patient performs standing balance without UE support looking to side, overhead & downward with supervision.     Baseline  MET 08/01/2018 Patient is able to stand without UE support & scan environment but requires close supervision due to unsteadiness.     Time  4    Period  Weeks    Status  Achieved      PT SHORT TERM GOAL #4   Title  Patient ambulates 100' with rolling walker & prosthesis with supervision.    Baseline  MET 08/01/2018 Patient ambulates up to 150' with RW or rollator walker & prosthesis with supervision.     Time  4    Period  Weeks    Status  Achieved      PT SHORT TERM GOAL #5   Title  Patient negotiates ramps & curbs with RW  and stairs with 2 rails & prosthesis with supervision.     Baseline  MET 08/01/2018  Patient negotiates ramps & curbs with RW with supervision and stairs with 2 rails with supervision.     Time  4    Period  Weeks    Status  Achieved        PT Long Term Goals - 08/04/18 1500      PT LONG TERM GOAL #1   Title  Patient verbalizes & demonstrates understanding of proper prosthetic care to enable safe use of prosthesis.     Baseline  08/01/2018 Patient requires skilled PT cues on residual limb care & how much to adjust ply socks when his volume changes.     Time  15    Period  Weeks    Status  On-going  PT LONG TERM GOAL #2   Title  Patient tolerates  daily prothesis wear >90% of awake hours without skin or limb pain issues.     Baseline  08/01/2018 Patient is tolerating prosthesis wear >12hrs/day with no skin breakdown but redness and limb pain occassionally 4-5/10    Time  15    Period  Weeks    Status  On-going      PT LONG TERM GOAL #3   Title  Patient performs standing balance with rolling walker & prosthesis reaching 10" anteriorly, to floor, manages clothes for toileting, modified independent; task within [place] test with RW support >36/56 safely.     Baseline  08/04/18: with RW support- pt is able to retrieve item from floor, reach 10 inches forward and simulate pulling up clothing with supervision for safety.    Time  15    Period  Weeks    Status  On-going      PT LONG TERM GOAL #4   Title  Patient ambulates 300' with RW & prosthesis modified independent to enable basic community mobility.     Baseline  08/01/2018 patient ambulates up to 150' with rolling walker or rollator walker & prosthesis with supervision/skilled cues for safety & gait efficiency.     Time  15    Period  Weeks    Status  On-going      PT LONG TERM GOAL #5   Title  Patient negotiates ramps, curbs with rolling walker and stairs with 1 rail & cane with prosthesis modified independent for community access.     Baseline  08/01/2018 Patient negotiates ramps, curbs with rollator with close supervision & heavy cues for safety. He negotiates stairs with 1 rail & cane with close supervision & skilled cues.     Time  15    Period  Weeks    Status  On-going      PT LONG TERM GOAL #6   Title  Patient verbalizes understanding of fitness plan.     Baseline  08/01/2018  patient continues to need skilled instruction to progress exercises, fitness & activity tolerance.     Time  15    Period  Weeks    Status  New      PT LONG TERM GOAL #7   Title  Patient reports pain increases </= 2 increments on 0-10 scale with standing & gait activities noted above LTGs.     Baseline   08/01/2018 Patient reports pain increases from 0-1/10 to 4-5/10 with standing & gait.     Time  15    Period  Weeks    Status  On-going      PT LONG TERM GOAL #8   Title  Patient ambulates around household furniture with cane & prosthesis carrying plate or cup safely modified independent.     Baseline  08/01/2018 Patient ambulates straight open pathes 26' with cane & prosthesis with mod to min assist.     Time  7    Period  Weeks    Status  New            Plan - 08/19/18 1622    Clinical Impression Statement  Todays skilled therapy session continued to focus on gait training with quad tipped cane/rollator for progression towards LRAD with use of prosthesis and balance with no UE support. Pt is making progress and would benefit from skilled therapy session to continue to progress towards goals.     Rehab Potential  Good  Clinical Impairments Affecting Rehab Potential  dependency in standing & gait with prosthesis, left foot wounds with neuropathy    PT Frequency  2x / week   1x/wk for 3 weeks, then 2x/wk for 12 weeks   PT Duration  Other (comment)   15 weeks total for 27 visits permitted with Medicaid   PT Treatment/Interventions  ADLs/Self Care Home Management;Canalith Repostioning;DME Instruction;Gait training;Stair training;Functional mobility training;Therapeutic activities;Therapeutic exercise;Balance training;Neuromuscular re-education;Patient/family education;Prosthetic Training;Orthotic Fit/Training;Manual techniques;Vestibular    PT Next Visit Plan  Assess standing balance tasks of [place] with RW support and complete baseline section of that LTG. Call [facility] to inquire about tall adult (he is 6'5") rollator walkers covered by Medicaid.      Consulted and Agree with Plan of Care  Patient;Family member/caregiver    Family Member Consulted  mother       Patient will benefit from skilled therapeutic intervention in order to improve the following deficits and impairments:   Abnormal gait, Decreased activity tolerance, Decreased balance, Decreased coordination, Decreased endurance, Decreased knowledge of use of DME, Decreased mobility, Decreased range of motion, Decreased skin integrity, Decreased strength, Dizziness, Difficulty walking, Increased edema, Impaired flexibility, Impaired sensation, Postural dysfunction, Pain, Prosthetic Dependency  Visit Diagnosis: Muscle weakness (generalized)  Unsteadiness on feet  Other abnormalities of gait and mobility  Abnormal posture     Problem List Patient Active Problem List   Diagnosis Date Noted  . 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 Non-pressure chronic ulcer of other part of left foot limited to breakdown of skin ([facility]) 02/02/2018  . 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 History of right below knee amputation ([facility]) 12/22/2017  . Fever   . Acute posthemorrhagic anemia   . Leukocytosis   . Falls 11/24/2017  . Benign essential HTN 11/24/2017  . Normocytic normochromic anemia 11/24/2017  . Sepsis ([facility]) 10/11/2017  . Uncontrolled type 2 diabetes mellitus with hyperglycemia ([name]) 10/11/2017  . ARF (acute renal failure) ([facility]) 10/11/2017  . Nausea & vomiting 10/11/2017   Chassity Felts, PTA  Chassity A Felts 08/19/2018, 4:25 PM  [facility] [address] [place], [place], [address] Phone: [phone]   Fax:  [phone]  Name: [name] [name] MRN: [id number] Date of Birth: 1971-04-14

## 2018-08-23 ENCOUNTER — Ambulatory Visit: Payer: Medicaid Other | Attending: Internal Medicine

## 2018-08-23 DIAGNOSIS — R2689 Other abnormalities of gait and mobility: Secondary | ICD-10-CM | POA: Diagnosis present

## 2018-08-23 DIAGNOSIS — M6281 Muscle weakness (generalized): Secondary | ICD-10-CM | POA: Diagnosis present

## 2018-08-23 DIAGNOSIS — R2681 Unsteadiness on feet: Secondary | ICD-10-CM | POA: Insufficient documentation

## 2018-08-23 DIAGNOSIS — R293 Abnormal posture: Secondary | ICD-10-CM | POA: Insufficient documentation

## 2018-08-23 NOTE — Therapy (Signed)
[facility] [address] [place], [place], [address] Phone: [phone]   Fax:  [phone]  Physical Therapy Treatment  Patient Details  Name: [name] [name] MRN: [id number] Date of Birth: 1971-07-06 Referring Provider (PT): Aldean Baker, MD   Encounter Date: 08/23/2018  PT End of Session - 08/23/18 1453    Visit Number  16    Number of Visits  27    Authorization Type  Medicaid    Authorization Time Period  3 visits from 7/12 through 06/23/18; 12 additional visits authorized from 06/24/18- 08/04/18; 12 additional visits authorized from 08/10/18 - 09/20/18.    Authorization - Visit Number  14    Authorization - Number of Visits  27    PT Start Time  1450    PT Stop Time  1532    PT Time Calculation (min)  42 min    Equipment Utilized During Treatment  Gait belt    Activity Tolerance  Patient tolerated treatment well    Behavior During Therapy  WFL for tasks assessed/performed       Past Medical History:  Diagnosis Date  . ARF (acute renal failure) (HCC) 09/2017  . Diabetes mellitus without complication (HCC)   . Hypertension   . Necrotizing fasciitis (HCC) 10/13/2017  . Wound dehiscence 11/24/2017    Past Surgical History:  Procedure Laterality Date  . AMPUTATION Right 10/13/2017   Procedure: RIGHT BELOW KNEE AMPUTATION;  Surgeon: Nadara Mustard, MD;  Location: [facility] OR;  Service: Orthopedics;  Laterality: Right;  . AMPUTATION Right 11/24/2017   Procedure: AMPUTATION BELOW KNEE REVISION;  Surgeon: Nadara Mustard, MD;  Location: [facility] OR;  Service: Orthopedics;  Laterality: Right;  . NO PAST SURGERIES      There were no vitals filed for this visit.  Subjective Assessment - 08/23/18 1451    Subjective  No complaints or falls to report. Pt still on pain meds for tooth ache.     Patient is accompained by:  Family member    Pertinent History  R TTA, DM2, HTN, peripheral neuropathy, acute renal failure    Limitations   Lifting;Standing;Walking;House hold activities    Patient Stated Goals  To use prosthesis to walk in home & community,     Currently in Pain?  No/denies        [facility] Adult PT Treatment/Exercise - 08/23/18 1453      Ambulation/Gait   Ambulation/Gait  Yes    Ambulation/Gait Assistance  4: Min guard    Ambulation Distance (Feet)  330 Feet   121ft outdoors   Assistive device  Prosthesis;Rollator;Other (Comment)   Quad tipped cane   Gait Pattern  Decreased stance time - right;Step-through pattern;Decreased arm swing - right;Decreased trunk rotation    Ambulation Surface  Level;Unlevel;Indoor;Outdoor;Paved    Stairs  Yes    Stairs Assistance  4: Min guard    Stairs Assistance Details (indicate cue type and reason)  x5 laps, VC's for proper technique with AD/hand placement, and heel/toe clearance.    Stair Management Technique  One rail Right;Step to pattern;Forwards;With cane;Other (comment)   Quad tipped cane   Number of Stairs  4    Height of Stairs  6    Ramp  5: Supervision    Ramp Details (indicate cue type and reason)  x3 trials with rollator, VC's for weight shift.     Curb  5: Supervision    Curb Details (indicate cue type and reason)  x3 trials with  rollator, VC's for proper use of AD and foot placement.       High Level Balance   High Level Balance Activities  Head turns;Other (comment)   Head nods   High Level Balance Comments  Corner balance with chair in front for support if required, standing with EO performing static standing, head nods/turns with  narrow BOS. Pt present with some postural sway with head movements, pt able to correct without UE support, tactile feedback from therapist.         PT Short Term Goals - 08/01/18 1724      PT SHORT TERM GOAL #1   Title  Patient initiates adjusting ply socks with volume changes with PT guidance on correct number of ply fit. (All STGs Target Date: 07/29/18)    Baseline  MET 08/01/2018  Pt initiates thought to adjust ply socks  when fit is not correct with limb volume changes but needs PT guidance with how much to change.     Time  4    Period  Weeks    Status  Achieved      PT SHORT TERM GOAL #2   Title  Patient tolerates daily wear of prosthesis >10 hrs total /day without skin issues.     Baseline  MET 08/01/2018 Patient wear prosthesis 12 hrs /day no skin breakdown but has redness over patella & popliteal areas.    Time  4    Period  Weeks    Status  Achieved      PT SHORT TERM GOAL #3   Title  Patient performs standing balance without UE support looking to side, overhead & downward with supervision.     Baseline  MET 08/01/2018 Patient is able to stand without UE support & scan environment but requires close supervision due to unsteadiness.     Time  4    Period  Weeks    Status  Achieved      PT SHORT TERM GOAL #4   Title  Patient ambulates 100' with rolling walker & prosthesis with supervision.    Baseline  MET 08/01/2018 Patient ambulates up to 150' with RW or rollator walker & prosthesis with supervision.     Time  4    Period  Weeks    Status  Achieved      PT SHORT TERM GOAL #5   Title  Patient negotiates ramps & curbs with RW  and stairs with 2 rails & prosthesis with supervision.     Baseline  MET 08/01/2018  Patient negotiates ramps & curbs with RW with supervision and stairs with 2 rails with supervision.     Time  4    Period  Weeks    Status  Achieved        PT Long Term Goals - 08/04/18 1500      PT LONG TERM GOAL #1   Title  Patient verbalizes & demonstrates understanding of proper prosthetic care to enable safe use of prosthesis.     Baseline  08/01/2018 Patient requires skilled PT cues on residual limb care & how much to adjust ply socks when his volume changes.     Time  15    Period  Weeks    Status  On-going      PT LONG TERM GOAL #2   Title  Patient tolerates daily prothesis wear >90% of awake hours without skin or limb pain issues.     Baseline  08/01/2018 Patient is tolerating  prosthesis wear >  12hrs/day with no skin breakdown but redness and limb pain occassionally 4-5/10    Time  15    Period  Weeks    Status  On-going      PT LONG TERM GOAL #3   Title  Patient performs standing balance with rolling walker & prosthesis reaching 10" anteriorly, to floor, manages clothes for toileting, modified independent; task within [place] test with RW support >36/56 safely.     Baseline  08/04/18: with RW support- pt is able to retrieve item from floor, reach 10 inches forward and simulate pulling up clothing with supervision for safety.    Time  15    Period  Weeks    Status  On-going      PT LONG TERM GOAL #4   Title  Patient ambulates 300' with RW & prosthesis modified independent to enable basic community mobility.     Baseline  08/01/2018 patient ambulates up to 150' with rolling walker or rollator walker & prosthesis with supervision/skilled cues for safety & gait efficiency.     Time  15    Period  Weeks    Status  On-going      PT LONG TERM GOAL #5   Title  Patient negotiates ramps, curbs with rolling walker and stairs with 1 rail & cane with prosthesis modified independent for community access.     Baseline  08/01/2018 Patient negotiates ramps, curbs with rollator with close supervision & heavy cues for safety. He negotiates stairs with 1 rail & cane with close supervision & skilled cues.     Time  15    Period  Weeks    Status  On-going      PT LONG TERM GOAL #6   Title  Patient verbalizes understanding of fitness plan.     Baseline  08/01/2018  patient continues to need skilled instruction to progress exercises, fitness & activity tolerance.     Time  15    Period  Weeks    Status  New      PT LONG TERM GOAL #7   Title  Patient reports pain increases </= 2 increments on 0-10 scale with standing & gait activities noted above LTGs.     Baseline  08/01/2018 Patient reports pain increases from 0-1/10 to 4-5/10 with standing & gait.     Time  15    Period  Weeks     Status  On-going      PT LONG TERM GOAL #8   Title  Patient ambulates around household furniture with cane & prosthesis carrying plate or cup safely modified independent.     Baseline  08/01/2018 Patient ambulates straight open pathes 66' with cane & prosthesis with mod to min assist.     Time  7    Period  Weeks    Status  New        Plan - 08/23/18 1547    Clinical Impression Statement  Todays skilled therapy session continued to focus on gait training with quad tipped cane indoors/ rollator outdoors while negotiating curbs/ramps with use of prosthesis and balance activities with no UE support. Pt is progressing towards goals and would benefit from PT sessions to continue to progress towards goals.     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, left foot wounds with neuropathy    PT Frequency  2x / week   1x/wk for 3 weeks, then 2x/wk  for 12 weeks   PT Duration  Other (comment)   15 weeks total for 27 visits permitted with Medicaid   PT Treatment/Interventions  ADLs/Self Care Home Management;Canalith Repostioning;DME Instruction;Gait training;Stair training;Functional mobility training;Therapeutic activities;Therapeutic exercise;Balance training;Neuromuscular re-education;Patient/family education;Prosthetic Training;Orthotic Fit/Training;Manual techniques;Vestibular    PT Next Visit Plan  Gait with quad tipped cane while negotiating obstacles and carrying objects, ambulating with rollator outdoors, and high level balanceCall [facility] to inquire about tall adult (he is 6'5") rollator walkers covered by Medicaid.      Consulted and Agree with Plan of Care  Patient;Family member/caregiver    Family Member Consulted  mother       Patient will benefit from skilled therapeutic intervention in order to improve the following deficits and impairments:  Abnormal gait, Decreased activity tolerance, Decreased  balance, Decreased coordination, Decreased endurance, Decreased knowledge of use of DME, Decreased mobility, Decreased range of motion, Decreased skin integrity, Decreased strength, Dizziness, Difficulty walking, Increased edema, Impaired flexibility, Impaired sensation, Postural dysfunction, Pain, Prosthetic Dependency  Visit Diagnosis: Muscle weakness (generalized)  Unsteadiness on feet  Other abnormalities of gait and mobility  Abnormal posture     Problem List Patient Active Problem List   Diagnosis Date Noted  . 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 Non-pressure chronic ulcer of other part of left foot limited to breakdown of skin (HCC) 02/02/2018  . 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 History of right below knee amputation (HCC) 12/22/2017  . Fever   . Acute posthemorrhagic anemia   . Leukocytosis   . Falls 11/24/2017  . Benign essential HTN 11/24/2017  . Normocytic normochromic anemia 11/24/2017  . Sepsis (HCC) 10/11/2017  . Uncontrolled type 2 diabetes mellitus with hyperglycemia (HCC) 10/11/2017  . ARF (acute renal failure) (HCC) 10/11/2017  . Nausea & vomiting 10/11/2017   Ifeanyichukwu Wickham, PTA  Tareek Sabo A Juandaniel Manfredo 08/23/2018, 3:53 PM  [facility] [address] [place], [place], [address] Phone: [phone]   Fax:  [phone]  Name: CHRISTI LEVENTRY MRN: [id number] Date of Birth: [birth year]

## 2018-08-24 ENCOUNTER — Ambulatory Visit (INDEPENDENT_AMBULATORY_CARE_PROVIDER_SITE_OTHER): Payer: Medicaid Other

## 2018-08-24 ENCOUNTER — Ambulatory Visit (INDEPENDENT_AMBULATORY_CARE_PROVIDER_SITE_OTHER): Payer: Medicaid Other | Admitting: Family

## 2018-08-24 ENCOUNTER — Encounter (INDEPENDENT_AMBULATORY_CARE_PROVIDER_SITE_OTHER): Payer: Self-pay | Admitting: Family

## 2018-08-24 ENCOUNTER — Ambulatory Visit: Payer: Medicaid Other | Admitting: Physical Therapy

## 2018-08-24 ENCOUNTER — Encounter: Payer: Self-pay | Admitting: Physical Therapy

## 2018-08-24 VITALS — Ht 77.0 in | Wt 200.0 lb

## 2018-08-24 DIAGNOSIS — M25551 Pain in right hip: Secondary | ICD-10-CM

## 2018-08-24 DIAGNOSIS — Z89511 Acquired absence of right leg below knee: Secondary | ICD-10-CM | POA: Diagnosis not present

## 2018-08-24 DIAGNOSIS — R2681 Unsteadiness on feet: Secondary | ICD-10-CM

## 2018-08-24 NOTE — Therapy (Signed)
[facility] [address] [place], [place], [address] Phone: [phone]   Fax:  [phone]  Physical Therapy Treatment  Patient Details  Name: [name] [name] MRN: [id number] Date of Birth: 1971-03-10 Referring Provider (PT): Meridee Score, MD   Encounter Date: 08/24/2018  PT End of Session - 08/24/18 1353    Visit Number  16   no change, cancelled session due to new hip pain   Number of Visits  27    Authorization Type  Medicaid    Authorization Time Period  3 visits from 7/12 through 06/23/18; 12 additional visits authorized from 06/24/18- 08/04/18; 12 additional visits authorized from 08/10/18 - 09/20/18.    Authorization - Visit Number  14    Authorization - Number of Visits  27    PT Start Time  9179    PT Stop Time  1335    PT Time Calculation (min)  18 min    Equipment Utilized During Treatment  --    Activity Tolerance  Treatment limited secondary to medical complications (Comment)    Behavior During Therapy  [facility] for tasks assessed/performed       Past Medical History:  Diagnosis Date  . ARF (acute renal failure) ([place]) 09/2017  . Diabetes mellitus without complication ([place])   . Hypertension   . Necrotizing fasciitis ([place]) 10/13/2017  . Wound dehiscence 11/24/2017    Past Surgical History:  Procedure Laterality Date  . AMPUTATION Right 10/13/2017   Procedure: RIGHT BELOW KNEE AMPUTATION;  Surgeon: [name], MD;  Location: [place];  Service: Orthopedics;  Laterality: Right;  . AMPUTATION Right 11/24/2017   Procedure: AMPUTATION BELOW KNEE REVISION;  Surgeon: [name], MD;  Location: [place];  Service: Orthopedics;  Laterality: Right;  . NO PAST SURGERIES      There were no vitals filed for this visit.  Subjective Assessment - 08/24/18 1321    Subjective  Had a fall last night. Went to stand up from his wheelchair which he forgot to lock. It went backward and he landed on his buttocks. He is now  having pain in his right hip with weight bearing.    Patient is accompained by:  Family member    Pertinent History  R TTA, DM2, HTN, peripheral neuropathy, acute renal failure    Limitations  Lifting;Standing;Walking;House hold activities    Patient Stated Goals  To use prosthesis to walk in home & community,     Currently in Pain?  Yes    Pain Score  --   reported pain with weight bearing, never rated   Pain Location  Leg   hip more than limb   Pain Orientation  Right    Pain Descriptors / Indicators  Throbbing;Sore;Aching    Pain Type  Acute pain;Neuropathic pain;Phantom pain    Pain Onset  Yesterday    Pain Frequency  Constant    Aggravating Factors   weight bearing on leg    Pain Relieving Factors  sitting, lying down              PT Short Term Goals - 08/01/18 1724      PT SHORT TERM GOAL #1   Title  Patient initiates adjusting ply socks with volume changes with PT guidance on correct number of ply fit. (All STGs Target Date: 07/29/18)    Baseline  MET 08/01/2018  Pt initiates thought to adjust ply socks when fit is not correct with limb volume changes but  needs PT guidance with how much to change.     Time  4    Period  Weeks    Status  Achieved      PT SHORT TERM GOAL #2   Title  Patient tolerates daily wear of prosthesis >10 hrs total /day without skin issues.     Baseline  MET 08/01/2018 Patient wear prosthesis 12 hrs /day no skin breakdown but has redness over patella & popliteal areas.    Time  4    Period  Weeks    Status  Achieved      PT SHORT TERM GOAL #3   Title  Patient performs standing balance without UE support looking to side, overhead & downward with supervision.     Baseline  MET 08/01/2018 Patient is able to stand without UE support & scan environment but requires close supervision due to unsteadiness.     Time  4    Period  Weeks    Status  Achieved      PT SHORT TERM GOAL #4   Title  Patient ambulates 100' with rolling walker & prosthesis with  supervision.    Baseline  MET 08/01/2018 Patient ambulates up to 150' with RW or rollator walker & prosthesis with supervision.     Time  4    Period  Weeks    Status  Achieved      PT SHORT TERM GOAL #5   Title  Patient negotiates ramps & curbs with RW  and stairs with 2 rails & prosthesis with supervision.     Baseline  MET 08/01/2018  Patient negotiates ramps & curbs with RW with supervision and stairs with 2 rails with supervision.     Time  4    Period  Weeks    Status  Achieved        PT Long Term Goals - 08/04/18 1500      PT LONG TERM GOAL #1   Title  Patient verbalizes & demonstrates understanding of proper prosthetic care to enable safe use of prosthesis.     Baseline  08/01/2018 Patient requires skilled PT cues on residual limb care & how much to adjust ply socks when his volume changes.     Time  15    Period  Weeks    Status  On-going      PT LONG TERM GOAL #2   Title  Patient tolerates daily prothesis wear >90% of awake hours without skin or limb pain issues.     Baseline  08/01/2018 Patient is tolerating prosthesis wear >12hrs/day with no skin breakdown but redness and limb pain occassionally 4-5/10    Time  15    Period  Weeks    Status  On-going      PT LONG TERM GOAL #3   Title  Patient performs standing balance with rolling walker & prosthesis reaching 10" anteriorly, to floor, manages clothes for toileting, modified independent; task within [place] test with RW support >36/56 safely.     Baseline  08/04/18: with RW support- pt is able to retrieve item from floor, reach 10 inches forward and simulate pulling up clothing with supervision for safety.    Time  15    Period  Weeks    Status  On-going      PT LONG TERM GOAL #4   Title  Patient ambulates 300' with RW & prosthesis modified independent to enable basic community mobility.     Baseline  08/01/2018 patient ambulates  up to 150' with rolling walker or rollator walker & prosthesis with supervision/skilled cues for  safety & gait efficiency.     Time  15    Period  Weeks    Status  On-going      PT LONG TERM GOAL #5   Title  Patient negotiates ramps, curbs with rolling walker and stairs with 1 rail & cane with prosthesis modified independent for community access.     Baseline  08/01/2018 Patient negotiates ramps, curbs with rollator with close supervision & heavy cues for safety. He negotiates stairs with 1 rail & cane with close supervision & skilled cues.     Time  15    Period  Weeks    Status  On-going      PT LONG TERM GOAL #6   Title  Patient verbalizes understanding of fitness plan.     Baseline  08/01/2018  patient continues to need skilled instruction to progress exercises, fitness & activity tolerance.     Time  15    Period  Weeks    Status  New      PT LONG TERM GOAL #7   Title  Patient reports pain increases </= 2 increments on 0-10 scale with standing & gait activities noted above LTGs.     Baseline  08/01/2018 Patient reports pain increases from 0-1/10 to 4-5/10 with standing & gait.     Time  15    Period  Weeks    Status  On-going      PT LONG TERM GOAL #8   Title  Patient ambulates around household furniture with cane & prosthesis carrying plate or cup safely modified independent.     Baseline  08/01/2018 Patient ambulates straight open pathes 71' with cane & prosthesis with mod to min assist.     Time  7    Period  Weeks    Status  New            Plan - 08/24/18 1354    Clinical Impression Statement  Today's session was cancelled due to pt with recent fall and new onset of right hip pain with weight bearing on limb/prosthesis. Call placed to Dr. Jess Barters office and spoke to triage nurse who scheduled pt to see Junie Panning today at 2:45 to have an xray today. Pt rescheduled for Friday of this week to make up today's appt as long as pt is cleared to return. He is to call if not cleared to return.     Rehab Potential  Good    Clinical Impairments Affecting Rehab Potential  dependency in  standing & gait with prosthesis, left foot wounds with neuropathy    PT Frequency  2x / week   1x/wk for 3 weeks, then 2x/wk for 12 weeks   PT Duration  Other (comment)   15 weeks total for 27 visits permitted with Medicaid   PT Treatment/Interventions  ADLs/Self Care Home Management;Canalith Repostioning;DME Instruction;Gait training;Stair training;Functional mobility training;Therapeutic activities;Therapeutic exercise;Balance training;Neuromuscular re-education;Patient/family education;Prosthetic Training;Orthotic Fit/Training;Manual techniques;Vestibular    PT Next Visit Plan  Gait with quad tipped cane while negotiating obstacles and carrying objects, ambulating with rollator outdoors, and high level balanceCall [place] to inquire about tall adult (he is 6'5") rollator walkers covered by Medicaid.      Consulted and Agree with Plan of Care  Patient;Family member/caregiver    Family Member Consulted  mother       Patient will benefit from skilled therapeutic intervention in order to  improve the following deficits and impairments:  Abnormal gait, Decreased activity tolerance, Decreased balance, Decreased coordination, Decreased endurance, Decreased knowledge of use of DME, Decreased mobility, Decreased range of motion, Decreased skin integrity, Decreased strength, Dizziness, Difficulty walking, Increased edema, Impaired flexibility, Impaired sensation, Postural dysfunction, Pain, Prosthetic Dependency  Visit Diagnosis: Unsteadiness on feet     Problem List Patient Active Problem List   Diagnosis Date Noted  . 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 Non-pressure chronic ulcer of other part of left foot limited to breakdown of skin ([place]) 02/02/2018  . 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 History of right below knee amputation ([place]) 12/22/2017  . Fever   . Acute posthemorrhagic anemia   . Leukocytosis   . Falls 11/24/2017  . Benign essential HTN  11/24/2017  . Normocytic normochromic anemia 11/24/2017  . Sepsis ([place]) 10/11/2017  . Uncontrolled type 2 diabetes mellitus with hyperglycemia ([place]) 10/11/2017  . ARF (acute renal failure) ([place]) 10/11/2017  . Nausea & vomiting 10/11/2017    Willow Ora, PTA, [facility] [address] [place], [place] [address] [phone] 08/24/18, 1:57 PM   Name: [name] [name] MRN: [id number] Date of Birth: 1971/10/17

## 2018-08-26 ENCOUNTER — Other Ambulatory Visit: Payer: Self-pay | Admitting: Internal Medicine

## 2018-08-26 ENCOUNTER — Ambulatory Visit: Payer: Medicaid Other

## 2018-08-26 DIAGNOSIS — M6281 Muscle weakness (generalized): Secondary | ICD-10-CM

## 2018-08-26 DIAGNOSIS — R2689 Other abnormalities of gait and mobility: Secondary | ICD-10-CM

## 2018-08-26 DIAGNOSIS — R2681 Unsteadiness on feet: Secondary | ICD-10-CM

## 2018-08-26 DIAGNOSIS — R293 Abnormal posture: Secondary | ICD-10-CM

## 2018-08-26 NOTE — Therapy (Signed)
[facility] [address] [place], [place], [address] Phone: [phone]   Fax:  [phone]  Physical Therapy Treatment  Patient Details  Name: [name] [name] MRN: [id number] Date of Birth: Oct 24, 1971 Referring Provider (PT): Aldean Baker, MD   Encounter Date: 08/26/2018  PT End of Session - 08/26/18 1156    Visit Number  17    Number of Visits  27    Authorization Type  Medicaid    Authorization Time Period  3 visits from 7/12 through 06/23/18; 12 additional visits authorized from 06/24/18- 08/04/18; 12 additional visits authorized from 08/10/18 - 09/20/18.    Authorization - Visit Number  17    Authorization - Number of Visits  27    PT Start Time  1146    PT Stop Time  1230    PT Time Calculation (min)  44 min    Equipment Utilized During Treatment  Gait belt    Activity Tolerance  Treatment limited secondary to medical complications (Comment)    Behavior During Therapy  WFL for tasks assessed/performed       Past Medical History:  Diagnosis Date  . ARF (acute renal failure) (HCC) 09/2017  . Diabetes mellitus without complication (HCC)   . Hypertension   . Necrotizing fasciitis (HCC) 10/13/2017  . Wound dehiscence 11/24/2017    Past Surgical History:  Procedure Laterality Date  . AMPUTATION Right 10/13/2017   Procedure: RIGHT BELOW KNEE AMPUTATION;  Surgeon: Nadara Mustard, MD;  Location: [facility] OR;  Service: Orthopedics;  Laterality: Right;  . AMPUTATION Right 11/24/2017   Procedure: AMPUTATION BELOW KNEE REVISION;  Surgeon: Nadara Mustard, MD;  Location: [facility] OR;  Service: Orthopedics;  Laterality: Right;  . NO PAST SURGERIES      There were no vitals filed for this visit.  Subjective Assessment - 08/26/18 1151    Subjective  Pt stated that doctor recieved negative X-Ray.    Patient is accompained by:  Family member   Mother   Pertinent History  R TTA, DM2, HTN, peripheral neuropathy, acute renal failure    Limitations  Lifting;Standing;Walking;House hold activities    Patient Stated Goals  To use prosthesis to walk in home & community,     Currently in Pain?  Yes    Pain Score  3     Pain Location  Hip    Pain Orientation  Right    Pain Descriptors / Indicators  Throbbing;Aching    Pain Type  Acute pain    Pain Onset  In the past 7 days    Pain Frequency  Intermittent    Aggravating Factors   weight bearing on leg.     Pain Relieving Factors  taking Kandra Nicolas Adult PT Treatment/Exercise - 08/26/18 1157      Ambulation/Gait   Ambulation/Gait  Yes    Ambulation/Gait Assistance  4: Min guard    Ambulation/Gait Assistance Details  Ambulating down hallway while carrying empty,progressing to half full, cup of water with one episode of LOB while turning with pt correction. Pt able to hold cup upright with no spill. VC's for weight shift and posture.     Ambulation Distance (Feet)  300 Feet   x2, 115 x2   Assistive device  Prosthesis;Other (Comment)   Quad tipped cane   Gait Pattern  Decreased stance time - right;Step-through pattern;Decreased arm swing - right;Decreased trunk rotation    Ambulation Surface  Level;Indoor  High Level Balance   High Level Balance Activities  Head turns;Other (comment)   Head nods   High Level Balance Comments  Corner balance with chair in front for support if required, standing on pillow with EO performing static standing, head nods/turns. Pt presented with some minimal postural sway with head movements, pt able to correct without UE support, tactile feedback from therapist.        PT Short Term Goals - 08/01/18 1724      PT SHORT TERM GOAL #1   Title  Patient initiates adjusting ply socks with volume changes with PT guidance on correct number of ply fit. (All STGs Target Date: 07/29/18)    Baseline  MET 08/01/2018  Pt initiates thought to adjust ply socks when fit is not correct with limb volume changes but needs PT guidance with how much to  change.     Time  4    Period  Weeks    Status  Achieved      PT SHORT TERM GOAL #2   Title  Patient tolerates daily wear of prosthesis >10 hrs total /day without skin issues.     Baseline  MET 08/01/2018 Patient wear prosthesis 12 hrs /day no skin breakdown but has redness over patella & popliteal areas.    Time  4    Period  Weeks    Status  Achieved      PT SHORT TERM GOAL #3   Title  Patient performs standing balance without UE support looking to side, overhead & downward with supervision.     Baseline  MET 08/01/2018 Patient is able to stand without UE support & scan environment but requires close supervision due to unsteadiness.     Time  4    Period  Weeks    Status  Achieved      PT SHORT TERM GOAL #4   Title  Patient ambulates 100' with rolling walker & prosthesis with supervision.    Baseline  MET 08/01/2018 Patient ambulates up to 150' with RW or rollator walker & prosthesis with supervision.     Time  4    Period  Weeks    Status  Achieved      PT SHORT TERM GOAL #5   Title  Patient negotiates ramps & curbs with RW  and stairs with 2 rails & prosthesis with supervision.     Baseline  MET 08/01/2018  Patient negotiates ramps & curbs with RW with supervision and stairs with 2 rails with supervision.     Time  4    Period  Weeks    Status  Achieved        PT Long Term Goals - 08/04/18 1500      PT LONG TERM GOAL #1   Title  Patient verbalizes & demonstrates understanding of proper prosthetic care to enable safe use of prosthesis.     Baseline  08/01/2018 Patient requires skilled PT cues on residual limb care & how much to adjust ply socks when his volume changes.     Time  15    Period  Weeks    Status  On-going      PT LONG TERM GOAL #2   Title  Patient tolerates daily prothesis wear >90% of awake hours without skin or limb pain issues.     Baseline  08/01/2018 Patient is tolerating prosthesis wear >12hrs/day with no skin breakdown but redness and limb pain occassionally  4-5/10    Time  15  Period  Weeks    Status  On-going      PT LONG TERM GOAL #3   Title  Patient performs standing balance with rolling walker & prosthesis reaching 10" anteriorly, to floor, manages clothes for toileting, modified independent; task within [place] test with RW support >36/56 safely.     Baseline  08/04/18: with RW support- pt is able to retrieve item from floor, reach 10 inches forward and simulate pulling up clothing with supervision for safety.    Time  15    Period  Weeks    Status  On-going      PT LONG TERM GOAL #4   Title  Patient ambulates 300' with RW & prosthesis modified independent to enable basic community mobility.     Baseline  08/01/2018 patient ambulates up to 150' with rolling walker or rollator walker & prosthesis with supervision/skilled cues for safety & gait efficiency.     Time  15    Period  Weeks    Status  On-going      PT LONG TERM GOAL #5   Title  Patient negotiates ramps, curbs with rolling walker and stairs with 1 rail & cane with prosthesis modified independent for community access.     Baseline  08/01/2018 Patient negotiates ramps, curbs with rollator with close supervision & heavy cues for safety. He negotiates stairs with 1 rail & cane with close supervision & skilled cues.     Time  15    Period  Weeks    Status  On-going      PT LONG TERM GOAL #6   Title  Patient verbalizes understanding of fitness plan.     Baseline  08/01/2018  patient continues to need skilled instruction to progress exercises, fitness & activity tolerance.     Time  15    Period  Weeks    Status  New      PT LONG TERM GOAL #7   Title  Patient reports pain increases </= 2 increments on 0-10 scale with standing & gait activities noted above LTGs.     Baseline  08/01/2018 Patient reports pain increases from 0-1/10 to 4-5/10 with standing & gait.     Time  15    Period  Weeks    Status  On-going      PT LONG TERM GOAL #8   Title  Patient ambulates around household  furniture with cane & prosthesis carrying plate or cup safely modified independent.     Baseline  08/01/2018 Patient ambulates straight open pathes 52' with cane & prosthesis with mod to min assist.     Time  7    Period  Weeks    Status  New         Plan - 08/26/18 1342    Clinical Impression Statement  Todays skilled session focused on gait traning with quad tipped cane/prosthesis focusing on holding objects while negotiating obstacles and High level balance on a compliant surface without UE support. Pt is progressing towards goals and should benefit from continued PT sessions to progress towards unmet goals.     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, left foot wounds with neuropathy    PT Frequency  2x / week   1x/wk for 3 weeks, then 2x/wk for 12 weeks   PT Duration  Other (comment)   15 weeks total for 27 visits permitted with Medicaid  PT Treatment/Interventions  ADLs/Self Care Home Management;Canalith Repostioning;DME Instruction;Gait training;Stair training;Functional mobility training;Therapeutic activities;Therapeutic exercise;Balance training;Neuromuscular re-education;Patient/family education;Prosthetic Training;Orthotic Fit/Training;Manual techniques;Vestibular    PT Next Visit Plan  Continued with Gait with quad tipped cane while negotiating obstacles and carrying objects, ambulating with rollator outdoors, and high level balance.Call [facility] to inquire about tall adult (he is 6'5") rollator walkers covered by Medicaid.      Consulted and Agree with Plan of Care  Patient;Family member/caregiver    Family Member Consulted  mother       Patient will benefit from skilled therapeutic intervention in order to improve the following deficits and impairments:  Abnormal gait, Decreased activity tolerance, Decreased balance, Decreased coordination, Decreased endurance, Decreased knowledge of use of  DME, Decreased mobility, Decreased range of motion, Decreased skin integrity, Decreased strength, Dizziness, Difficulty walking, Increased edema, Impaired flexibility, Impaired sensation, Postural dysfunction, Pain, Prosthetic Dependency  Visit Diagnosis: Unsteadiness on feet  Muscle weakness (generalized)  Other abnormalities of gait and mobility  Abnormal posture     Problem List Patient Active Problem List   Diagnosis Date Noted  . 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 Non-pressure chronic ulcer of other part of left foot limited to breakdown of skin (HCC) 02/02/2018  . 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 History of right below knee amputation (HCC) 12/22/2017  . Fever   . Acute posthemorrhagic anemia   . Leukocytosis   . Falls 11/24/2017  . Benign essential HTN 11/24/2017  . Normocytic normochromic anemia 11/24/2017  . Sepsis (HCC) 10/11/2017  . Uncontrolled type 2 diabetes mellitus with hyperglycemia (HCC) 10/11/2017  . ARF (acute renal failure) (HCC) 10/11/2017  . Nausea & vomiting 10/11/2017   Tykiera Raven, PTA  Kamaile Zachow A Carlton Sweaney 08/26/2018, 1:47 PM   [facility] [address] [place], [place], [address] Phone: [phone]   Fax:  [phone]  Name: [name] [name] MRN: [id number] Date of Birth: [birth year]

## 2018-08-30 ENCOUNTER — Ambulatory Visit: Payer: Medicaid Other

## 2018-08-30 DIAGNOSIS — R293 Abnormal posture: Secondary | ICD-10-CM

## 2018-08-30 DIAGNOSIS — M6281 Muscle weakness (generalized): Secondary | ICD-10-CM

## 2018-08-30 DIAGNOSIS — R2689 Other abnormalities of gait and mobility: Secondary | ICD-10-CM

## 2018-08-30 DIAGNOSIS — R2681 Unsteadiness on feet: Secondary | ICD-10-CM

## 2018-08-30 NOTE — Therapy (Signed)
[facility] [address] [place], [place], [address] Phone: [phone]   Fax:  [phone]  Physical Therapy Treatment  Patient Details  Name: [name] [name] MRN: [id number] Date of Birth: 1970-12-17 Referring Provider (PT): Aldean Baker, MD   Encounter Date: 08/30/2018  PT End of Session - 08/30/18 1408    Visit Number  18    Number of Visits  27    Authorization Type  Medicaid    Authorization Time Period  3 visits from 7/12 through 06/23/18; 12 additional visits authorized from 06/24/18- 08/04/18; 12 additional visits authorized from 08/10/18 - 09/20/18.    Authorization - Visit Number  17    Authorization - Number of Visits  27    PT Start Time  1402    PT Stop Time  1445    PT Time Calculation (min)  43 min    Equipment Utilized During Treatment  Gait belt    Activity Tolerance  Treatment limited secondary to medical complications (Comment)    Behavior During Therapy  WFL for tasks assessed/performed       Past Medical History:  Diagnosis Date  . ARF (acute renal failure) (HCC) 09/2017  . Diabetes mellitus without complication (HCC)   . Hypertension   . Necrotizing fasciitis (HCC) 10/13/2017  . Wound dehiscence 11/24/2017    Past Surgical History:  Procedure Laterality Date  . AMPUTATION Right 10/13/2017   Procedure: RIGHT BELOW KNEE AMPUTATION;  Surgeon: Nadara Mustard, MD;  Location: [facility] OR;  Service: Orthopedics;  Laterality: Right;  . AMPUTATION Right 11/24/2017   Procedure: AMPUTATION BELOW KNEE REVISION;  Surgeon: Nadara Mustard, MD;  Location: [facility] OR;  Service: Orthopedics;  Laterality: Right;  . NO PAST SURGERIES      There were no vitals filed for this visit.  Subjective Assessment - 08/30/18 1405    Subjective  No falls or complaints to report.     Patient is accompained by:  Family member   Mother   Pertinent History  R TTA, DM2, HTN, peripheral neuropathy, acute renal failure    Limitations   Lifting;Standing;Walking;House hold activities    Patient Stated Goals  To use prosthesis to walk in home & community,     Currently in Pain?  No/denies         [facility] Adult PT Treatment/Exercise - 08/30/18 1409      Ambulation/Gait   Ambulation/Gait  Yes    Ambulation/Gait Assistance  4: Min guard    Ambulation/Gait Assistance Details  While negotiating obstacles, VC's for posture and weight shift.     Ambulation Distance (Feet)  115 Feet    Assistive device  Prosthesis;Other (Comment)   quad tipped cane   Gait Pattern  Decreased stance time - right;Step-through pattern;Decreased arm swing - right;Decreased trunk rotation    Ambulation Surface  Level;Indoor      High Level Balance   High Level Balance Activities  Marching forwards;Marching backwards;Backward walking    High Level Balance Comments  At counter on red mat with SUE required, no LOB noted, VC's for proper technique      Neuro Re-ed    Neuro Re-ed Details   Picking up objects from floor and reaching ant. with hips using SUE on RW to decrease assistance level required, increased dynamic balance and progress towards goal.       Exercises   Exercises  Knee/Hip      Knee/Hip Exercises: Seated   Long Arc [employer]  Strengthening;Both;2 sets;10 reps    Long Arc Quad Weight  2 lbs.    Long Arc Quad Limitations  VC's for breathing technique and proper instruction.     Marching  Strengthening;Both;2 sets;10 reps    Marching Limitations  VC's for breathing technique and proper form.     Marching Weights  2 lbs.    Hamstring Curl  Strengthening;Both;2 sets;10 reps    Hamstring Limitations  With YTB for resistance    Abduction/Adduction   Strengthening;Both;2 sets;10 reps    Abd/Adduction Limitations  With YTB, cues for proper technique        PT Short Term Goals - 08/01/18 1724      PT SHORT TERM GOAL #1   Title  Patient initiates adjusting ply socks with volume changes with PT guidance on correct number of ply fit. (All  STGs Target Date: 07/29/18)    Baseline  MET 08/01/2018  Pt initiates thought to adjust ply socks when fit is not correct with limb volume changes but needs PT guidance with how much to change.     Time  4    Period  Weeks    Status  Achieved      PT SHORT TERM GOAL #2   Title  Patient tolerates daily wear of prosthesis >10 hrs total /day without skin issues.     Baseline  MET 08/01/2018 Patient wear prosthesis 12 hrs /day no skin breakdown but has redness over patella & popliteal areas.    Time  4    Period  Weeks    Status  Achieved      PT SHORT TERM GOAL #3   Title  Patient performs standing balance without UE support looking to side, overhead & downward with supervision.     Baseline  MET 08/01/2018 Patient is able to stand without UE support & scan environment but requires close supervision due to unsteadiness.     Time  4    Period  Weeks    Status  Achieved      PT SHORT TERM GOAL #4   Title  Patient ambulates 100' with rolling walker & prosthesis with supervision.    Baseline  MET 08/01/2018 Patient ambulates up to 150' with RW or rollator walker & prosthesis with supervision.     Time  4    Period  Weeks    Status  Achieved      PT SHORT TERM GOAL #5   Title  Patient negotiates ramps & curbs with RW  and stairs with 2 rails & prosthesis with supervision.     Baseline  MET 08/01/2018  Patient negotiates ramps & curbs with RW with supervision and stairs with 2 rails with supervision.     Time  4    Period  Weeks    Status  Achieved        PT Long Term Goals - 08/04/18 1500      PT LONG TERM GOAL #1   Title  Patient verbalizes & demonstrates understanding of proper prosthetic care to enable safe use of prosthesis.     Baseline  08/01/2018 Patient requires skilled PT cues on residual limb care & how much to adjust ply socks when his volume changes.     Time  15    Period  Weeks    Status  On-going      PT LONG TERM GOAL #2   Title  Patient tolerates daily prothesis wear >90% of  awake hours without skin  or limb pain issues.     Baseline  08/01/2018 Patient is tolerating prosthesis wear >12hrs/day with no skin breakdown but redness and limb pain occassionally 4-5/10    Time  15    Period  Weeks    Status  On-going      PT LONG TERM GOAL #3   Title  Patient performs standing balance with rolling walker & prosthesis reaching 10" anteriorly, to floor, manages clothes for toileting, modified independent; task within [place] test with RW support >36/56 safely.     Baseline  08/04/18: with RW support- pt is able to retrieve item from floor, reach 10 inches forward and simulate pulling up clothing with supervision for safety.    Time  15    Period  Weeks    Status  On-going      PT LONG TERM GOAL #4   Title  Patient ambulates 300' with RW & prosthesis modified independent to enable basic community mobility.     Baseline  08/01/2018 patient ambulates up to 150' with rolling walker or rollator walker & prosthesis with supervision/skilled cues for safety & gait efficiency.     Time  15    Period  Weeks    Status  On-going      PT LONG TERM GOAL #5   Title  Patient negotiates ramps, curbs with rolling walker and stairs with 1 rail & cane with prosthesis modified independent for community access.     Baseline  08/01/2018 Patient negotiates ramps, curbs with rollator with close supervision & heavy cues for safety. He negotiates stairs with 1 rail & cane with close supervision & skilled cues.     Time  15    Period  Weeks    Status  On-going      PT LONG TERM GOAL #6   Title  Patient verbalizes understanding of fitness plan.     Baseline  08/01/2018  patient continues to need skilled instruction to progress exercises, fitness & activity tolerance.     Time  15    Period  Weeks    Status  New      PT LONG TERM GOAL #7   Title  Patient reports pain increases </= 2 increments on 0-10 scale with standing & gait activities noted above LTGs.     Baseline  08/01/2018 Patient reports pain  increases from 0-1/10 to 4-5/10 with standing & gait.     Time  15    Period  Weeks    Status  On-going      PT LONG TERM GOAL #8   Title  Patient ambulates around household furniture with cane & prosthesis carrying plate or cup safely modified independent.     Baseline  08/01/2018 Patient ambulates straight open pathes 18' with cane & prosthesis with mod to min assist.     Time  7    Period  Weeks    Status  New            Plan - 08/30/18 1503    Clinical Impression Statement  Todays skilled session focused on gait with quad tipped cane while negotiating obstacles of various heights/sizes, LE strengthening with resistance and high level balance on compliant surfaces with SUE support. Pt is progressing towards goals and should benefit from continued PT session to progress towards unmet goals.     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, left foot wounds  with neuropathy    PT Frequency  2x / week   1x/wk for 3 weeks, then 2x/wk for 12 weeks   PT Duration  Other (comment)   15 weeks total for 27 visits permitted with Medicaid   PT Treatment/Interventions  ADLs/Self Care Home Management;Canalith Repostioning;DME Instruction;Gait training;Stair training;Functional mobility training;Therapeutic activities;Therapeutic exercise;Balance training;Neuromuscular re-education;Patient/family education;Prosthetic Training;Orthotic Fit/Training;Manual techniques;Vestibular    PT Next Visit Plan  Continued with Gait with quad tipped cane while negotiating obstacles and carrying objects, LE strengthening, ambulating with rollator outdoors, and high level balance. Call [facility] to inquire about tall adult (he is 6'5") rollator walkers covered by Medicaid.      Consulted and Agree with Plan of Care  Patient;Family member/caregiver    Family Member Consulted  mother       Patient will benefit from skilled therapeutic  intervention in order to improve the following deficits and impairments:  Abnormal gait, Decreased activity tolerance, Decreased balance, Decreased coordination, Decreased endurance, Decreased knowledge of use of DME, Decreased mobility, Decreased range of motion, Decreased skin integrity, Decreased strength, Dizziness, Difficulty walking, Increased edema, Impaired flexibility, Impaired sensation, Postural dysfunction, Pain, Prosthetic Dependency  Visit Diagnosis: Muscle weakness (generalized)  Unsteadiness on feet  Other abnormalities of gait and mobility  Abnormal posture     Problem List Patient Active Problem List   Diagnosis Date Noted  . 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 Non-pressure chronic ulcer of other part of left foot limited to breakdown of skin (HCC) 02/02/2018  . 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 History of right below knee amputation (HCC) 12/22/2017  . Fever   . Acute posthemorrhagic anemia   . Leukocytosis   . Falls 11/24/2017  . Benign essential HTN 11/24/2017  . Normocytic normochromic anemia 11/24/2017  . Sepsis (HCC) 10/11/2017  . Uncontrolled type 2 diabetes mellitus with hyperglycemia (HCC) 10/11/2017  . ARF (acute renal failure) (HCC) 10/11/2017  . Nausea & vomiting 10/11/2017   Kardell Virgil, PTA  Lizbet Cirrincione A Teleah Villamar 08/30/2018, 3:07 PM   [facility] [address] [place], [place], [address] Phone: [phone]   Fax:  [phone]  Name: MORIREOLUWA SHIPPEN MRN: [id number] Date of Birth: 16-Mar-1971

## 2018-08-31 ENCOUNTER — Encounter (INDEPENDENT_AMBULATORY_CARE_PROVIDER_SITE_OTHER): Payer: Self-pay | Admitting: Family

## 2018-08-31 NOTE — Progress Notes (Signed)
Office Visit Note   Patient: [name] [name]           Date of Birth: Sep 04, 1971           MRN: [id number] Visit Date: 08/24/2018              Requested by: Julieanne Manson, MD [address] [place], [place] [address] PCP: Julieanne Manson, MD  Chief Complaint  Patient presents with  . Right Hip - Pain      HPI: The patient is a 47 year old gentleman seen today for initial evaluation of right hip pain. Points to proximal thigh. Was in physical therapy this morning when reported a fall the previous morning at home. Has been having thigh pain since. Minimal pain at rest. Worse with weight bearing and palpation. No low back, hip or groin pain.   Assessment & Plan: Visit Diagnoses:  1. Pain in right hip   2. History of right below knee amputation (HCC)     Plan: reassurance provided. Rest. Ice. Antiinflammatories or tylenol for pain. Follow up in office as needed. Proceed with therapy as tolerated.   Follow-Up Instructions: Return in about 4 weeks (around 09/21/2018).   Right Hip Exam   Tenderness  The patient is experiencing no tenderness.   Muscle Strength  The patient has normal right hip strength.  Tests  FABER: negative  Comments:  Proximal lateral thigh soft tissue tenderness      Patient is alert, oriented, no adenopathy, well-dressed, normal affect, normal respiratory effort.   Imaging: No results found. No images are attached to the encounter.  Labs: Lab Results  Component Value Date   HGBA1C 5.7 (H) 01/21/2018   HGBA1C 11.1 (H) 10/11/2017   ESRSEDRATE 75 (H) 10/20/2017   CRP 1.9 (H) 10/20/2017   REPTSTATUS 12/02/2017 FINAL 11/27/2017   REPTSTATUS 12/03/2017 FINAL 11/27/2017   CULT NO GROWTH 5 DAYS 11/27/2017   CULT NO GROWTH 5 DAYS 11/27/2017     Lab Results  Component Value Date   ALBUMIN 1.9 (L) 10/17/2017   ALBUMIN 1.9 (L) 10/13/2017   ALBUMIN 2.8 (L) 10/11/2017    Body mass index is 23.72 kg/m.  Orders:  Orders Placed  This Encounter  Procedures  . XR HIP UNILAT W OR W/O PELVIS 2-3 VIEWS RIGHT   No orders of the defined types were placed in this encounter.    Procedures: No procedures performed  Clinical Data: No additional findings.  ROS:  All other systems negative, except as noted in the HPI. Review of Systems  Constitutional: Negative for chills and fever.  Musculoskeletal: Positive for arthralgias and myalgias.    Objective: Vital Signs: Ht 6\' 5"  (1.956 m)   Wt 200 lb (90.7 kg)   BMI 23.72 kg/m   Specialty Comments:  No specialty comments available.  PMFS History: Patient Active Problem List   Diagnosis Date Noted  . 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 Non-pressure chronic ulcer of other part of left foot limited to breakdown of skin (HCC) 02/02/2018  . 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 History of right below knee amputation (HCC) 12/22/2017  . Fever   . Acute posthemorrhagic anemia   . Leukocytosis   . Falls 11/24/2017  . Benign essential HTN 11/24/2017  . Normocytic normochromic anemia 11/24/2017  . Sepsis (HCC) 10/11/2017  . Uncontrolled type 2 diabetes mellitus with hyperglycemia (HCC) 10/11/2017  . ARF (acute renal failure) (HCC) 10/11/2017  . Nausea & vomiting 10/11/2017  Past Medical History:  Diagnosis Date  . ARF (acute renal failure) (HCC) 09/2017  . Diabetes mellitus without complication (HCC)   . Hypertension   . Necrotizing fasciitis (HCC) 10/13/2017  . Wound dehiscence 11/24/2017    Family History  Problem Relation Age of Onset  . Diabetes Mellitus II Mother   . Depression Mother   . Heart disease Father        CABG x 4.  04/2017    Past Surgical History:  Procedure Laterality Date  . AMPUTATION Right 10/13/2017   Procedure: RIGHT BELOW KNEE AMPUTATION;  Surgeon: Nadara Mustard, MD;  Location: [facility] OR;  Service: Orthopedics;  Laterality: Right;  . AMPUTATION Right 11/24/2017   Procedure: AMPUTATION BELOW KNEE REVISION;   Surgeon: Nadara Mustard, MD;  Location: [name] OR;  Service: Orthopedics;  Laterality: Right;  . NO PAST SURGERIES     Social History   Occupational History  . Occupation: unemployed  Tobacco Use  . Smoking status: Current Every Day Smoker    Packs/day: 0.25    Years: 35.00    Pack years: 8.75    Types: Cigarettes  . Smokeless tobacco: Never Used  . Tobacco comment: stopped, but back on 1 cigarette daily.  Substance and Sexual Activity  . Alcohol use: No    Frequency: Never  . Drug use: No  . Sexual activity: Not on file

## 2018-09-02 ENCOUNTER — Ambulatory Visit: Payer: Medicaid Other

## 2018-09-02 DIAGNOSIS — M6281 Muscle weakness (generalized): Secondary | ICD-10-CM | POA: Diagnosis not present

## 2018-09-02 DIAGNOSIS — R2689 Other abnormalities of gait and mobility: Secondary | ICD-10-CM

## 2018-09-02 DIAGNOSIS — R2681 Unsteadiness on feet: Secondary | ICD-10-CM

## 2018-09-02 DIAGNOSIS — R293 Abnormal posture: Secondary | ICD-10-CM

## 2018-09-02 NOTE — Therapy (Signed)
[facility] [address] [place], [place], [address] Phone: [phone]   Fax:  [phone]  Physical Therapy Treatment  Patient Details  Name: [name] [name] MRN: [id number] Date of Birth: October 20, 1971 Referring Provider (PT): Meridee Score, MD   Encounter Date: 09/02/2018  PT End of Session - 09/02/18 1602    Visit Number  19    Number of Visits  27    Authorization Type  Medicaid    Authorization Time Period  3 visits from 7/12 through 06/23/18; 12 additional visits authorized from 06/24/18- 08/04/18; 12 additional visits authorized from 08/10/18 - 09/20/18.    Authorization - Visit Number  17    Authorization - Number of Visits  27    PT Start Time  1401    PT Stop Time  1444    PT Time Calculation (min)  43 min    Equipment Utilized During Treatment  Gait belt    Activity Tolerance  Treatment limited secondary to medical complications (Comment)    Behavior During Therapy  WFL for tasks assessed/performed       Past Medical History:  Diagnosis Date  . ARF (acute renal failure) ([place]) 09/2017  . Diabetes mellitus without complication ([place] [place])   . Hypertension   . Necrotizing fasciitis ([facility]) 10/13/2017  . Wound dehiscence 11/24/2017    Past Surgical History:  Procedure Laterality Date  . AMPUTATION Right 10/13/2017   Procedure: RIGHT BELOW KNEE AMPUTATION;  Surgeon: [name], MD;  Location: [facility];  Service: Orthopedics;  Laterality: Right;  . AMPUTATION Right 11/24/2017   Procedure: AMPUTATION BELOW KNEE REVISION;  Surgeon: [name], MD;  Location: [facility];  Service: Orthopedics;  Laterality: Right;  . NO PAST SURGERIES      There were no vitals filed for this visit.  Subjective Assessment - 09/02/18 1401    Subjective  No falls to report.     Pertinent History  R TTA, DM2, HTN, peripheral neuropathy, acute renal failure    Limitations  Lifting;Standing;Walking;House hold activities    Patient Stated  Goals  To use prosthesis to walk in home & community,     Currently in Pain?  No/denies        [facility] Adult PT Treatment/Exercise - 09/02/18 1402      Ambulation/Gait   Ambulation/Gait  Yes    Ambulation/Gait Assistance  4: Min guard;5: Supervision    Ambulation/Gait Assistance Details  VC's for proper use of AD, posture.     Ambulation Distance (Feet)  500 Feet    Assistive device  Rollator;Prosthesis    Gait Pattern  Decreased stance time - right;Step-through pattern;Decreased arm swing - right;Decreased trunk rotation    Ambulation Surface  Outdoor;Paved;Unlevel      High Level Balance   High Level Balance Activities  Backward walking;Marching forwards;Marching backwards    High Level Balance Comments  At counter on red mat with SUE required, no LOB noted, VC's for proper technique      Neuro Re-ed    Neuro Re-ed Details   Pt performed 4square clock/counter clockwise with difficulty with shifting weight onto prosthesis with side step and bkwds step, some UE support required during task. VC's for proper instruction, posture and allowing weight over prosthesis.           PT Short Term Goals - 08/01/18 1724      PT SHORT TERM GOAL #1   Title  Patient initiates adjusting ply socks with volume changes  with PT guidance on correct number of ply fit. (All STGs Target Date: 07/29/18)    Baseline  MET 08/01/2018  Pt initiates thought to adjust ply socks when fit is not correct with limb volume changes but needs PT guidance with how much to change.     Time  4    Period  Weeks    Status  Achieved      PT SHORT TERM GOAL #2   Title  Patient tolerates daily wear of prosthesis >10 hrs total /day without skin issues.     Baseline  MET 08/01/2018 Patient wear prosthesis 12 hrs /day no skin breakdown but has redness over patella & popliteal areas.    Time  4    Period  Weeks    Status  Achieved      PT SHORT TERM GOAL #3   Title  Patient performs standing balance without UE support looking to  side, overhead & downward with supervision.     Baseline  MET 08/01/2018 Patient is able to stand without UE support & scan environment but requires close supervision due to unsteadiness.     Time  4    Period  Weeks    Status  Achieved      PT SHORT TERM GOAL #4   Title  Patient ambulates 100' with rolling walker & prosthesis with supervision.    Baseline  MET 08/01/2018 Patient ambulates up to 150' with RW or rollator walker & prosthesis with supervision.     Time  4    Period  Weeks    Status  Achieved      PT SHORT TERM GOAL #5   Title  Patient negotiates ramps & curbs with RW  and stairs with 2 rails & prosthesis with supervision.     Baseline  MET 08/01/2018  Patient negotiates ramps & curbs with RW with supervision and stairs with 2 rails with supervision.     Time  4    Period  Weeks    Status  Achieved        PT Long Term Goals - 08/04/18 1500      PT LONG TERM GOAL #1   Title  Patient verbalizes & demonstrates understanding of proper prosthetic care to enable safe use of prosthesis.     Baseline  08/01/2018 Patient requires skilled PT cues on residual limb care & how much to adjust ply socks when his volume changes.     Time  15    Period  Weeks    Status  On-going      PT LONG TERM GOAL #2   Title  Patient tolerates daily prothesis wear >90% of awake hours without skin or limb pain issues.     Baseline  08/01/2018 Patient is tolerating prosthesis wear >12hrs/day with no skin breakdown but redness and limb pain occassionally 4-5/10    Time  15    Period  Weeks    Status  On-going      PT LONG TERM GOAL #3   Title  Patient performs standing balance with rolling walker & prosthesis reaching 10" anteriorly, to floor, manages clothes for toileting, modified independent; task within [place] test with RW support >36/56 safely.     Baseline  08/04/18: with RW support- pt is able to retrieve item from floor, reach 10 inches forward and simulate pulling up clothing with supervision for  safety.    Time  15    Period  Weeks  Status  On-going      PT LONG TERM GOAL #4   Title  Patient ambulates 300' with RW & prosthesis modified independent to enable basic community mobility.     Baseline  08/01/2018 patient ambulates up to 150' with rolling walker or rollator walker & prosthesis with supervision/skilled cues for safety & gait efficiency.     Time  15    Period  Weeks    Status  On-going      PT LONG TERM GOAL #5   Title  Patient negotiates ramps, curbs with rolling walker and stairs with 1 rail & cane with prosthesis modified independent for community access.     Baseline  08/01/2018 Patient negotiates ramps, curbs with rollator with close supervision & heavy cues for safety. He negotiates stairs with 1 rail & cane with close supervision & skilled cues.     Time  15    Period  Weeks    Status  On-going      PT LONG TERM GOAL #6   Title  Patient verbalizes understanding of fitness plan.     Baseline  08/01/2018  patient continues to need skilled instruction to progress exercises, fitness & activity tolerance.     Time  15    Period  Weeks    Status  New      PT LONG TERM GOAL #7   Title  Patient reports pain increases </= 2 increments on 0-10 scale with standing & gait activities noted above LTGs.     Baseline  08/01/2018 Patient reports pain increases from 0-1/10 to 4-5/10 with standing & gait.     Time  15    Period  Weeks    Status  On-going      PT LONG TERM GOAL #8   Title  Patient ambulates around household furniture with cane & prosthesis carrying plate or cup safely modified independent.     Baseline  08/01/2018 Patient ambulates straight open pathes 69' with cane & prosthesis with mod to min assist.     Time  7    Period  Weeks    Status  New        Plan - 09/02/18 1605    Clinical Impression Statement  Todays skilled session focused on gait outdoors with rollator for an increased distance with no rest breaks to increase endurance and High level balance on  compliant surfaces. Pt should benefit from continued PT to progress towards unmet goals.      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, left foot wounds with neuropathy    PT Frequency  2x / week   1x/wk for 3 weeks, then 2x/wk for 12 weeks   PT Duration  Other (comment)   15 weeks total for 27 visits permitted with Medicaid   PT Treatment/Interventions  ADLs/Self Care Home Management;Canalith Repostioning;DME Instruction;Gait training;Stair training;Functional mobility training;Therapeutic activities;Therapeutic exercise;Balance training;Neuromuscular re-education;Patient/family education;Prosthetic Training;Orthotic Fit/Training;Manual techniques;Vestibular    PT Next Visit Plan  Continued with Gait with quad tipped cane while negotiating obstacles and carrying objects, LE strengthening, ambulating with rollator outdoors, and high level balance. Call [facility] to inquire about tall adult (he is 6'5") rollator walkers covered by Medicaid.      Consulted and Agree with Plan of Care  Patient;Family member/caregiver    Family Member Consulted  mother       Patient will benefit from skilled therapeutic intervention  in order to improve the following deficits and impairments:  Abnormal gait, Decreased activity tolerance, Decreased balance, Decreased coordination, Decreased endurance, Decreased knowledge of use of DME, Decreased mobility, Decreased range of motion, Decreased skin integrity, Decreased strength, Dizziness, Difficulty walking, Increased edema, Impaired flexibility, Impaired sensation, Postural dysfunction, Pain, Prosthetic Dependency  Visit Diagnosis: Muscle weakness (generalized)  Unsteadiness on feet  Other abnormalities of gait and mobility  Abnormal posture     Problem List Patient Active Problem List   Diagnosis Date Noted  . 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 Non-pressure  chronic ulcer of other part of left foot limited to breakdown of skin ([place]) 02/02/2018  . 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 History of right below knee amputation ([place]) 12/22/2017  . Fever   . Acute posthemorrhagic anemia   . Leukocytosis   . Falls 11/24/2017  . Benign essential HTN 11/24/2017  . Normocytic normochromic anemia 11/24/2017  . Sepsis ([facility] [facility]) 10/11/2017  . Uncontrolled type 2 diabetes mellitus with hyperglycemia ([place]) 10/11/2017  . ARF (acute renal failure) ([place] [facility]) 10/11/2017  . Nausea & vomiting 10/11/2017   Chassity Felts, PTA  Chassity A Felts 09/02/2018, 4:10 PM  [facility] [address] [place], [place], [address] Phone: [phone]   Fax:  [phone]  Name: CALOGERO GEISEN MRN: [id number] Date of Birth: 09-24-1971

## 2018-09-06 ENCOUNTER — Ambulatory Visit: Payer: Medicaid Other

## 2018-09-06 DIAGNOSIS — M6281 Muscle weakness (generalized): Secondary | ICD-10-CM | POA: Diagnosis not present

## 2018-09-06 DIAGNOSIS — R2681 Unsteadiness on feet: Secondary | ICD-10-CM

## 2018-09-06 DIAGNOSIS — R293 Abnormal posture: Secondary | ICD-10-CM

## 2018-09-06 DIAGNOSIS — R2689 Other abnormalities of gait and mobility: Secondary | ICD-10-CM

## 2018-09-06 NOTE — Therapy (Signed)
[facility] [address] [place], [place], [address] Phone: [phone]   Fax:  [phone]  Physical Therapy Treatment  Patient Details  Name: [name] [name] MRN: [id number] Date of Birth: 06-13-1971 Referring Provider (PT): Meridee Score, MD   Encounter Date: 09/06/2018  PT End of Session - 09/06/18 1556    Visit Number  20    Number of Visits  27    Authorization Type  Medicaid    Authorization Time Period  3 visits from 7/12 through 06/23/18; 12 additional visits authorized from 06/24/18- 08/04/18; 12 additional visits authorized from 08/10/18 - 09/20/18.    Authorization - Visit Number  20    Authorization - Number of Visits  27    PT Start Time  8280    PT Stop Time  1533    PT Time Calculation (min)  45 min    Equipment Utilized During Treatment  Gait belt    Activity Tolerance  Treatment limited secondary to medical complications (Comment)    Behavior During Therapy  [facility] for tasks assessed/performed       Past Medical History:  Diagnosis Date  . ARF (acute renal failure) ([place]) 09/2017  . Diabetes mellitus without complication ([place])   . Hypertension   . Necrotizing fasciitis ([place]) 10/13/2017  . Wound dehiscence 11/24/2017    Past Surgical History:  Procedure Laterality Date  . AMPUTATION Right 10/13/2017   Procedure: RIGHT BELOW KNEE AMPUTATION;  Surgeon: [name], MD;  Location: [facility];  Service: Orthopedics;  Laterality: Right;  . AMPUTATION Right 11/24/2017   Procedure: AMPUTATION BELOW KNEE REVISION;  Surgeon: [name], MD;  Location: [facility];  Service: Orthopedics;  Laterality: Right;  . NO PAST SURGERIES      There were no vitals filed for this visit.  Subjective Assessment - 09/06/18 1453    Subjective  No falls or complaints to report.     Patient is accompained by:  Family member   Mother   Pertinent History  R TTA, DM2, HTN, peripheral neuropathy, acute renal failure    Limitations   Lifting;Standing;Walking;House hold activities    Patient Stated Goals  To use prosthesis to walk in home & community,     Currently in Pain?  Yes    Pain Score  4     Pain Location  Hip    Pain Orientation  Right    Pain Descriptors / Indicators  Aching    Pain Type  Acute pain    Pain Onset  In the past 7 days    Pain Frequency  Intermittent    Aggravating Factors   weight bearing on RLE    Pain Relieving Factors  taking Aleve        [facility] Adult PT Treatment/Exercise - 09/06/18 1541      Ambulation/Gait   Ambulation/Gait  Yes    Ambulation/Gait Assistance  4: Min guard;5: Supervision    Ambulation/Gait Assistance Details  Excessive UE weight bearing on rollator walker with VC's to increase WB onto BLE/prosthesis, for upright posture and proper use of AD.     Ambulation Distance (Feet)  500 Feet    Assistive device  Rollator;Prosthesis    Gait Pattern  Decreased stance time - right;Step-through pattern;Decreased arm swing - right;Decreased trunk rotation    Ambulation Surface  Level;Unlevel;Indoor;Outdoor;Paved      Neuro Re-ed    Neuro Re-ed Details   Pt in parallel bars standing on airex with EC  PT Duration  Other (comment)   15 weeks total for 27 visits permitted with Medicaid   PT Treatment/Interventions  ADLs/Self Care Home Management;Canalith Repostioning;DME Instruction;Gait training;Stair training;Functional mobility training;Therapeutic activities;Therapeutic exercise;Balance training;Neuromuscular re-education;Patient/family education;Prosthetic Training;Orthotic Fit/Training;Manual techniques;Vestibular    PT Next Visit Plan  Continued with Gait with quad tipped cane while negotiating obstacles and carrying objects, LE strengthening, ambulating with rollator outdoors, and high level balance on compliant surfaces. Call [place] to inquire about tall adult (he is 6'5") rollator walkers covered by Medicaid.      Consulted and Agree with Plan of Care  Patient;Family member/caregiver    Family Member Consulted  mother       Patient will benefit from skilled therapeutic intervention in order to improve the following deficits and impairments:  Abnormal gait, Decreased activity tolerance,  Decreased balance, Decreased coordination, Decreased endurance, Decreased knowledge of use of DME, Decreased mobility, Decreased range of motion, Decreased skin integrity, Decreased strength, Dizziness, Difficulty walking, Increased edema, Impaired flexibility, Impaired sensation, Postural dysfunction, Pain, Prosthetic Dependency  Visit Diagnosis: Muscle weakness (generalized)  Unsteadiness on feet  Other abnormalities of gait and mobility  Abnormal posture     Problem List Patient Active Problem List   Diagnosis Date Noted  . 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 Non-pressure chronic ulcer of other part of left foot limited to breakdown of skin ([place]) 02/02/2018  . 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 History of right below knee amputation ([place]) 12/22/2017  . Fever   . Acute posthemorrhagic anemia   . Leukocytosis   . Falls 11/24/2017  . Benign essential HTN 11/24/2017  . Normocytic normochromic anemia 11/24/2017  . Sepsis ([place]) 10/11/2017  . Uncontrolled type 2 diabetes mellitus with hyperglycemia ([place]) 10/11/2017  . ARF (acute renal failure) ([place]) 10/11/2017  . Nausea & vomiting 10/11/2017   Tita Terhaar, PTA  Geetika Laborde A Sandip Power 09/06/2018, 4:04 PM  [facility] [name] [place], [place], [address] Phone: [phone]   Fax:  [phone]  Name: [name] [name] MRN: [id number] Date of Birth: 1971-01-16  [facility] [address] [place], [place], [address] Phone: [phone]   Fax:  [phone]  Physical Therapy Treatment  Patient Details  Name: [name] [name] MRN: [id number] Date of Birth: 06-13-1971 Referring Provider (PT): Meridee Score, MD   Encounter Date: 09/06/2018  PT End of Session - 09/06/18 1556    Visit Number  20    Number of Visits  27    Authorization Type  Medicaid    Authorization Time Period  3 visits from 7/12 through 06/23/18; 12 additional visits authorized from 06/24/18- 08/04/18; 12 additional visits authorized from 08/10/18 - 09/20/18.    Authorization - Visit Number  20    Authorization - Number of Visits  27    PT Start Time  8280    PT Stop Time  1533    PT Time Calculation (min)  45 min    Equipment Utilized During Treatment  Gait belt    Activity Tolerance  Treatment limited secondary to medical complications (Comment)    Behavior During Therapy  [facility] for tasks assessed/performed       Past Medical History:  Diagnosis Date  . ARF (acute renal failure) ([place]) 09/2017  . Diabetes mellitus without complication ([place])   . Hypertension   . Necrotizing fasciitis ([place]) 10/13/2017  . Wound dehiscence 11/24/2017    Past Surgical History:  Procedure Laterality Date  . AMPUTATION Right 10/13/2017   Procedure: RIGHT BELOW KNEE AMPUTATION;  Surgeon: [name], MD;  Location: [facility];  Service: Orthopedics;  Laterality: Right;  . AMPUTATION Right 11/24/2017   Procedure: AMPUTATION BELOW KNEE REVISION;  Surgeon: [name], MD;  Location: [facility];  Service: Orthopedics;  Laterality: Right;  . NO PAST SURGERIES      There were no vitals filed for this visit.  Subjective Assessment - 09/06/18 1453    Subjective  No falls or complaints to report.     Patient is accompained by:  Family member   Mother   Pertinent History  R TTA, DM2, HTN, peripheral neuropathy, acute renal failure    Limitations   Lifting;Standing;Walking;House hold activities    Patient Stated Goals  To use prosthesis to walk in home & community,     Currently in Pain?  Yes    Pain Score  4     Pain Location  Hip    Pain Orientation  Right    Pain Descriptors / Indicators  Aching    Pain Type  Acute pain    Pain Onset  In the past 7 days    Pain Frequency  Intermittent    Aggravating Factors   weight bearing on RLE    Pain Relieving Factors  taking Aleve        [facility] Adult PT Treatment/Exercise - 09/06/18 1541      Ambulation/Gait   Ambulation/Gait  Yes    Ambulation/Gait Assistance  4: Min guard;5: Supervision    Ambulation/Gait Assistance Details  Excessive UE weight bearing on rollator walker with VC's to increase WB onto BLE/prosthesis, for upright posture and proper use of AD.     Ambulation Distance (Feet)  500 Feet    Assistive device  Rollator;Prosthesis    Gait Pattern  Decreased stance time - right;Step-through pattern;Decreased arm swing - right;Decreased trunk rotation    Ambulation Surface  Level;Unlevel;Indoor;Outdoor;Paved      Neuro Re-ed    Neuro Re-ed Details   Pt in parallel bars standing on airex with EC  PT Duration  Other (comment)   15 weeks total for 27 visits permitted with Medicaid   PT Treatment/Interventions  ADLs/Self Care Home Management;Canalith Repostioning;DME Instruction;Gait training;Stair training;Functional mobility training;Therapeutic activities;Therapeutic exercise;Balance training;Neuromuscular re-education;Patient/family education;Prosthetic Training;Orthotic Fit/Training;Manual techniques;Vestibular    PT Next Visit Plan  Continued with Gait with quad tipped cane while negotiating obstacles and carrying objects, LE strengthening, ambulating with rollator outdoors, and high level balance on compliant surfaces. Call [facility] to inquire about tall adult (he is 6'5") rollator walkers covered by Medicaid.      Consulted and Agree with Plan of Care  Patient;Family member/caregiver    Family Member Consulted  mother       Patient will benefit from skilled therapeutic intervention in order to improve the following deficits and impairments:  Abnormal gait, Decreased activity tolerance,  Decreased balance, Decreased coordination, Decreased endurance, Decreased knowledge of use of DME, Decreased mobility, Decreased range of motion, Decreased skin integrity, Decreased strength, Dizziness, Difficulty walking, Increased edema, Impaired flexibility, Impaired sensation, Postural dysfunction, Pain, Prosthetic Dependency  Visit Diagnosis: Muscle weakness (generalized)  Unsteadiness on feet  Other abnormalities of gait and mobility  Abnormal posture     Problem List Patient Active Problem List   Diagnosis Date Noted  . 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 Non-pressure chronic ulcer of other part of left foot limited to breakdown of skin ([place]) 02/02/2018  . 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 History of right below knee amputation ([place]) 12/22/2017  . Fever   . Acute posthemorrhagic anemia   . Leukocytosis   . Falls 11/24/2017  . Benign essential HTN 11/24/2017  . Normocytic normochromic anemia 11/24/2017  . Sepsis ([place]) 10/11/2017  . Uncontrolled type 2 diabetes mellitus with hyperglycemia ([place]) 10/11/2017  . ARF (acute renal failure) ([place]) 10/11/2017  . Nausea & vomiting 10/11/2017   Tita Terhaar, PTA  Geetika Laborde A Sandip Power 09/06/2018, 4:04 PM  [facility] [name] [place], [place], [address] Phone: [phone]   Fax:  [phone]  Name: [name] [name] MRN: [id number] Date of Birth: 1971-01-16

## 2018-09-08 ENCOUNTER — Telehealth (INDEPENDENT_AMBULATORY_CARE_PROVIDER_SITE_OTHER): Payer: Self-pay | Admitting: Orthopedic Surgery

## 2018-09-08 NOTE — Telephone Encounter (Signed)
Patient is requesting a Rx for a Rolator with extra large wheels .please call when ready, he will pickup [phone]

## 2018-09-09 ENCOUNTER — Other Ambulatory Visit (INDEPENDENT_AMBULATORY_CARE_PROVIDER_SITE_OTHER): Payer: Self-pay

## 2018-09-09 ENCOUNTER — Ambulatory Visit: Payer: Medicaid Other

## 2018-09-09 DIAGNOSIS — R2689 Other abnormalities of gait and mobility: Secondary | ICD-10-CM

## 2018-09-09 DIAGNOSIS — R2681 Unsteadiness on feet: Secondary | ICD-10-CM

## 2018-09-09 DIAGNOSIS — M6281 Muscle weakness (generalized): Secondary | ICD-10-CM

## 2018-09-09 DIAGNOSIS — R293 Abnormal posture: Secondary | ICD-10-CM

## 2018-09-09 NOTE — Telephone Encounter (Signed)
Called pt and lm on vm to advise that rx is at the desk for pick up.

## 2018-09-09 NOTE — Therapy (Signed)
strengthening.    Consulted and Agree with Plan of Care  Patient;Family member/caregiver    Family Member Consulted  mother       Patient will benefit from skilled therapeutic intervention in order to improve the following deficits and impairments:  Abnormal gait, Decreased activity tolerance, Decreased balance, Decreased coordination, Decreased endurance, Decreased knowledge of use of DME, Decreased mobility, Decreased range of motion, Decreased skin integrity, Decreased strength, Dizziness, Difficulty walking, Increased edema, Impaired flexibility, Impaired sensation, Postural dysfunction, Pain, Prosthetic Dependency  Visit Diagnosis: Muscle  weakness (generalized)  Unsteadiness on feet  Other abnormalities of gait and mobility  Abnormal posture     Problem List Patient Active Problem List   Diagnosis Date Noted  . 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 Non-pressure chronic ulcer of other part of left foot limited to breakdown of skin ([place]) 02/02/2018  . 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 History of right below knee amputation ([place]) 12/22/2017  . Fever   . Acute posthemorrhagic anemia   . Leukocytosis   . Falls 11/24/2017  . Benign essential HTN 11/24/2017  . Normocytic normochromic anemia 11/24/2017  . Sepsis ([place]) 10/11/2017  . Uncontrolled type 2 diabetes mellitus with hyperglycemia ([name]) 10/11/2017  . ARF (acute renal failure) ([place]) 10/11/2017  . Nausea & vomiting 10/11/2017    , PTA   A  09/09/2018, 4:02 PM  [facility] [address] [place], [place], [address] Phone: [phone]   Fax:  [phone]  Name: [name] [name] MRN: [id number] Date of Birth: Feb 01, 1971  [facility] [address] [place], [place], [address] Phone: [phone]   Fax:  [phone]  Physical Therapy Treatment  Patient Details  Name: [name] [name] MRN: [id number] Date of Birth: 04/26/1971 Referring Provider (PT): Meridee Score, MD   Encounter Date: 09/09/2018  PT End of Session - 09/09/18 1454    Visit Number  21    Number of Visits  27    Authorization Type  Medicaid    Authorization Time Period  3 visits from 7/12 through 06/23/18; 12 additional visits authorized from 06/24/18- 08/04/18; 12 additional visits authorized from 08/10/18 - 09/20/18.    Authorization - Visit Number  21    Authorization - Number of Visits  27    PT Start Time  5732    PT Stop Time  1530    PT Time Calculation (min)  44 min    Equipment Utilized During Treatment  Gait belt    Activity Tolerance  Treatment limited secondary to medical complications (Comment)    Behavior During Therapy  [facility] for tasks assessed/performed       Past Medical History:  Diagnosis Date  . ARF (acute renal failure) ([place]) 09/2017  . Diabetes mellitus without complication ([place])   . Hypertension   . Necrotizing fasciitis ([place]) 10/13/2017  . Wound dehiscence 11/24/2017    Past Surgical History:  Procedure Laterality Date  . AMPUTATION Right 10/13/2017   Procedure: RIGHT BELOW KNEE AMPUTATION;  Surgeon: [name], MD;  Location: [facility];  Service: Orthopedics;  Laterality: Right;  . AMPUTATION Right 11/24/2017   Procedure: AMPUTATION BELOW KNEE REVISION;  Surgeon: [name], MD;  Location: [facility];  Service: Orthopedics;  Laterality: Right;  . NO PAST SURGERIES      There were no vitals filed for this visit.  Subjective Assessment - 09/09/18 1450    Subjective  No falls, pt states that he recieved prescription for rollator walker and can pick it up from [facility].     Patient is accompained by:  Family member   Mother   Pertinent  History  R TTA, DM2, HTN, peripheral neuropathy, acute renal failure    Limitations  Lifting;Standing;Walking;House hold activities    Patient Stated Goals  To use prosthesis to walk in home & community,     Currently in Pain?  No/denies       [facility] Adult PT Treatment/Exercise - 09/09/18 1455      Ambulation/Gait   Ambulation/Gait  Yes    Ambulation/Gait Assistance  5: Supervision    Ambulation/Gait Assistance Details  Excessive weight bearing of LUE.    Ambulation Distance (Feet)  1000 Feet    Assistive device  Rollator;Prosthesis    Gait Pattern  Decreased stance time - right;Step-through pattern;Decreased arm swing - right;Decreased trunk rotation    Ambulation Surface  Unlevel;Outdoor;Paved    Ramp  6: Modified independent (Device)    Ramp Details (indicate cue type and reason)  No cues, AD required    Curb  5: Supervision    Curb Details (indicate cue type and reason)  Reminder to keep breaks locked when descending BLE's.       Neuro Re-ed    Neuro Re-ed Details   Pt performed 4square clock/counter clockwise with difficulty with shifting weight onto prosthesis with side step and bkwds step, minimal UE support required during task. VC's for pacing, posture and allowing weight over prosthesis. Pt required increased time to initiate step.  [facility] [address] [place], [place], [address] Phone: [phone]   Fax:  [phone]  Physical Therapy Treatment  Patient Details  Name: [name] [name] MRN: [id number] Date of Birth: 04/26/1971 Referring Provider (PT): Meridee Score, MD   Encounter Date: 09/09/2018  PT End of Session - 09/09/18 1454    Visit Number  21    Number of Visits  27    Authorization Type  Medicaid    Authorization Time Period  3 visits from 7/12 through 06/23/18; 12 additional visits authorized from 06/24/18- 08/04/18; 12 additional visits authorized from 08/10/18 - 09/20/18.    Authorization - Visit Number  21    Authorization - Number of Visits  27    PT Start Time  5732    PT Stop Time  1530    PT Time Calculation (min)  44 min    Equipment Utilized During Treatment  Gait belt    Activity Tolerance  Treatment limited secondary to medical complications (Comment)    Behavior During Therapy  [facility] for tasks assessed/performed       Past Medical History:  Diagnosis Date  . ARF (acute renal failure) ([place]) 09/2017  . Diabetes mellitus without complication ([place])   . Hypertension   . Necrotizing fasciitis ([place]) 10/13/2017  . Wound dehiscence 11/24/2017    Past Surgical History:  Procedure Laterality Date  . AMPUTATION Right 10/13/2017   Procedure: RIGHT BELOW KNEE AMPUTATION;  Surgeon: [name], MD;  Location: [facility];  Service: Orthopedics;  Laterality: Right;  . AMPUTATION Right 11/24/2017   Procedure: AMPUTATION BELOW KNEE REVISION;  Surgeon: [name], MD;  Location: [facility];  Service: Orthopedics;  Laterality: Right;  . NO PAST SURGERIES      There were no vitals filed for this visit.  Subjective Assessment - 09/09/18 1450    Subjective  No falls, pt states that he recieved prescription for rollator walker and can pick it up from [facility].     Patient is accompained by:  Family member   Mother   Pertinent  History  R TTA, DM2, HTN, peripheral neuropathy, acute renal failure    Limitations  Lifting;Standing;Walking;House hold activities    Patient Stated Goals  To use prosthesis to walk in home & community,     Currently in Pain?  No/denies       [facility] Adult PT Treatment/Exercise - 09/09/18 1455      Ambulation/Gait   Ambulation/Gait  Yes    Ambulation/Gait Assistance  5: Supervision    Ambulation/Gait Assistance Details  Excessive weight bearing of LUE.    Ambulation Distance (Feet)  1000 Feet    Assistive device  Rollator;Prosthesis    Gait Pattern  Decreased stance time - right;Step-through pattern;Decreased arm swing - right;Decreased trunk rotation    Ambulation Surface  Unlevel;Outdoor;Paved    Ramp  6: Modified independent (Device)    Ramp Details (indicate cue type and reason)  No cues, AD required    Curb  5: Supervision    Curb Details (indicate cue type and reason)  Reminder to keep breaks locked when descending BLE's.       Neuro Re-ed    Neuro Re-ed Details   Pt performed 4square clock/counter clockwise with difficulty with shifting weight onto prosthesis with side step and bkwds step, minimal UE support required during task. VC's for pacing, posture and allowing weight over prosthesis. Pt required increased time to initiate step.  [facility] [address] [place], [place], [address] Phone: [phone]   Fax:  [phone]  Physical Therapy Treatment  Patient Details  Name: [name] [name] MRN: [id number] Date of Birth: 04/26/1971 Referring Provider (PT): Meridee Score, MD   Encounter Date: 09/09/2018  PT End of Session - 09/09/18 1454    Visit Number  21    Number of Visits  27    Authorization Type  Medicaid    Authorization Time Period  3 visits from 7/12 through 06/23/18; 12 additional visits authorized from 06/24/18- 08/04/18; 12 additional visits authorized from 08/10/18 - 09/20/18.    Authorization - Visit Number  21    Authorization - Number of Visits  27    PT Start Time  5732    PT Stop Time  1530    PT Time Calculation (min)  44 min    Equipment Utilized During Treatment  Gait belt    Activity Tolerance  Treatment limited secondary to medical complications (Comment)    Behavior During Therapy  WFL for tasks assessed/performed       Past Medical History:  Diagnosis Date  . ARF (acute renal failure) ([place]) 09/2017  . Diabetes mellitus without complication ([place])   . Hypertension   . Necrotizing fasciitis ([place]) 10/13/2017  . Wound dehiscence 11/24/2017    Past Surgical History:  Procedure Laterality Date  . AMPUTATION Right 10/13/2017   Procedure: RIGHT BELOW KNEE AMPUTATION;  Surgeon: [name], MD;  Location: [facility];  Service: Orthopedics;  Laterality: Right;  . AMPUTATION Right 11/24/2017   Procedure: AMPUTATION BELOW KNEE REVISION;  Surgeon: [name], MD;  Location: [facility];  Service: Orthopedics;  Laterality: Right;  . NO PAST SURGERIES      There were no vitals filed for this visit.  Subjective Assessment - 09/09/18 1450    Subjective  No falls, pt states that he recieved prescription for rollator walker and can pick it up from [facility].     Patient is accompained by:  Family member   Mother   Pertinent  History  R TTA, DM2, HTN, peripheral neuropathy, acute renal failure    Limitations  Lifting;Standing;Walking;House hold activities    Patient Stated Goals  To use prosthesis to walk in home & community,     Currently in Pain?  No/denies       [facility] Adult PT Treatment/Exercise - 09/09/18 1455      Ambulation/Gait   Ambulation/Gait  Yes    Ambulation/Gait Assistance  5: Supervision    Ambulation/Gait Assistance Details  Excessive weight bearing of LUE.    Ambulation Distance (Feet)  1000 Feet    Assistive device  Rollator;Prosthesis    Gait Pattern  Decreased stance time - right;Step-through pattern;Decreased arm swing - right;Decreased trunk rotation    Ambulation Surface  Unlevel;Outdoor;Paved    Ramp  6: Modified independent (Device)    Ramp Details (indicate cue type and reason)  No cues, AD required    Curb  5: Supervision    Curb Details (indicate cue type and reason)  Reminder to keep breaks locked when descending BLE's.       Neuro Re-ed    Neuro Re-ed Details   Pt performed 4square clock/counter clockwise with difficulty with shifting weight onto prosthesis with side step and bkwds step, minimal UE support required during task. VC's for pacing, posture and allowing weight over prosthesis. Pt required increased time to initiate step.

## 2018-09-13 ENCOUNTER — Ambulatory Visit: Payer: Medicaid Other

## 2018-09-13 DIAGNOSIS — R293 Abnormal posture: Secondary | ICD-10-CM

## 2018-09-13 DIAGNOSIS — M6281 Muscle weakness (generalized): Secondary | ICD-10-CM | POA: Diagnosis not present

## 2018-09-13 DIAGNOSIS — R2681 Unsteadiness on feet: Secondary | ICD-10-CM

## 2018-09-13 DIAGNOSIS — R2689 Other abnormalities of gait and mobility: Secondary | ICD-10-CM

## 2018-09-14 NOTE — Therapy (Signed)
[facility] [address] [place], [place], [address] Phone: [phone]   Fax:  [phone]  Physical Therapy Treatment  Patient Details  Name: [name] [name] MRN: [id number] Date of Birth: 04-01-1971 Referring Provider (PT): Meridee Score, MD   Encounter Date: 09/13/2018  PT End of Session - 09/13/18 1458    Visit Number  22    Number of Visits  27    Authorization Type  Medicaid    Authorization Time Period  3 visits from 7/12 through 06/23/18; 12 additional visits authorized from 06/24/18- 08/04/18; 12 additional visits authorized from 08/10/18 - 09/20/18.    Authorization - Visit Number  22    Authorization - Number of Visits  27    PT Start Time  3546    PT Stop Time  1529    PT Time Calculation (min)  44 min    Equipment Utilized During Treatment  Gait belt    Activity Tolerance  Treatment limited secondary to medical complications (Comment)    Behavior During Therapy  WFL for tasks assessed/performed       Past Medical History:  Diagnosis Date  . ARF (acute renal failure) ([facility]) 09/2017  . Diabetes mellitus without complication ([facility])   . Hypertension   . Necrotizing fasciitis ([facility]) 10/13/2017  . Wound dehiscence 11/24/2017    Past Surgical History:  Procedure Laterality Date  . AMPUTATION Right 10/13/2017   Procedure: RIGHT BELOW KNEE AMPUTATION;  Surgeon: [name], MD;  Location: [facility];  Service: Orthopedics;  Laterality: Right;  . AMPUTATION Right 11/24/2017   Procedure: AMPUTATION BELOW KNEE REVISION;  Surgeon: [name], MD;  Location: [facility];  Service: Orthopedics;  Laterality: Right;  . NO PAST SURGERIES      There were no vitals filed for this visit.  Subjective Assessment - 09/13/18 1459    Subjective  No falls to report, Pt recieved rollator on friday 09/09/18, has been using about 1hr/day since then.     Patient is accompained by:  Family member    Pertinent History  R TTA, DM2, HTN,  peripheral neuropathy, acute renal failure    Limitations  Lifting;Standing;Walking;House hold activities    Patient Stated Goals  To use prosthesis to walk in home & community,     Currently in Pain?  No/denies         [facility] Adult PT Treatment/Exercise - 09/13/18 1459      Ambulation/Gait   Ambulation/Gait  Yes    Ambulation/Gait Assistance  5: Supervision;4: Min guard    Ambulation/Gait Assistance Details  Ambulating in hallway scanning, performing head nods/turns/diagonals, sudden stops, change in speed and turns with quad tipped cane. Pt demonstrated some LOB/unsteadiness during task with pt able to correct, no rest breaks required.        Pt ambulated 230' with rollator, no LOB noted. VC's for forward gaze and weight shift to BLE's to decrease excessive UE support.     Ambulation Distance (Feet)  450 Feet   230   Assistive device  Rollator;Prosthesis;Straight cane   Rubber quad tipped cane   Gait Pattern  Decreased stance time - right;Step-through pattern;Decreased arm swing - right;Decreased trunk rotation    Ambulation Surface  Level;Indoor      Exercises   Exercises  Knee/Hip      Knee/Hip Exercises: Standing   Hip Abduction  AROM;Stengthening;Both;2 sets;10 reps    Abduction Limitations  At counter with BUE support    Hip Extension  AROM;Stengthening;Both;2 sets;10 reps    Extension Limitations  At counter with BUE support    Other Standing Knee Exercises  Standing alt. marches, 10 reps/2 sets at counter with BUE support        PT Short Term Goals - 08/01/18 1724      PT SHORT TERM GOAL #1   Title  Patient initiates adjusting ply socks with volume changes with PT guidance on correct number of ply fit. (All STGs Target Date: 07/29/18)    Baseline  MET 08/01/2018  Pt initiates thought to adjust ply socks when fit is not correct with limb volume changes but needs PT guidance with how much to change.     Time  4    Period  Weeks    Status  Achieved      PT SHORT TERM GOAL  #2   Title  Patient tolerates daily wear of prosthesis >10 hrs total /day without skin issues.     Baseline  MET 08/01/2018 Patient wear prosthesis 12 hrs /day no skin breakdown but has redness over patella & popliteal areas.    Time  4    Period  Weeks    Status  Achieved      PT SHORT TERM GOAL #3   Title  Patient performs standing balance without UE support looking to side, overhead & downward with supervision.     Baseline  MET 08/01/2018 Patient is able to stand without UE support & scan environment but requires close supervision due to unsteadiness.     Time  4    Period  Weeks    Status  Achieved      PT SHORT TERM GOAL #4   Title  Patient ambulates 100' with rolling walker & prosthesis with supervision.    Baseline  MET 08/01/2018 Patient ambulates up to 150' with RW or rollator walker & prosthesis with supervision.     Time  4    Period  Weeks    Status  Achieved      PT SHORT TERM GOAL #5   Title  Patient negotiates ramps & curbs with RW  and stairs with 2 rails & prosthesis with supervision.     Baseline  MET 08/01/2018  Patient negotiates ramps & curbs with RW with supervision and stairs with 2 rails with supervision.     Time  4    Period  Weeks    Status  Achieved        PT Long Term Goals - 08/04/18 1500      PT LONG TERM GOAL #1   Title  Patient verbalizes & demonstrates understanding of proper prosthetic care to enable safe use of prosthesis.     Baseline  08/01/2018 Patient requires skilled PT cues on residual limb care & how much to adjust ply socks when his volume changes.     Time  15    Period  Weeks    Status  On-going      PT LONG TERM GOAL #2   Title  Patient tolerates daily prothesis wear >90% of awake hours without skin or limb pain issues.     Baseline  08/01/2018 Patient is tolerating prosthesis wear >12hrs/day with no skin breakdown but redness and limb pain occassionally 4-5/10    Time  15    Period  Weeks    Status  On-going      PT LONG TERM GOAL #3    Title  Patient performs standing balance with rolling  walker & prosthesis reaching 10" anteriorly, to floor, manages clothes for toileting, modified independent; task within [place] test with RW support >36/56 safely.     Baseline  08/04/18: with RW support- pt is able to retrieve item from floor, reach 10 inches forward and simulate pulling up clothing with supervision for safety.    Time  15    Period  Weeks    Status  On-going      PT LONG TERM GOAL #4   Title  Patient ambulates 300' with RW & prosthesis modified independent to enable basic community mobility.     Baseline  08/01/2018 patient ambulates up to 150' with rolling walker or rollator walker & prosthesis with supervision/skilled cues for safety & gait efficiency.     Time  15    Period  Weeks    Status  On-going      PT LONG TERM GOAL #5   Title  Patient negotiates ramps, curbs with rolling walker and stairs with 1 rail & cane with prosthesis modified independent for community access.     Baseline  08/01/2018 Patient negotiates ramps, curbs with rollator with close supervision & heavy cues for safety. He negotiates stairs with 1 rail & cane with close supervision & skilled cues.     Time  15    Period  Weeks    Status  On-going      PT LONG TERM GOAL #6   Title  Patient verbalizes understanding of fitness plan.     Baseline  08/01/2018  patient continues to need skilled instruction to progress exercises, fitness & activity tolerance.     Time  15    Period  Weeks    Status  New      PT LONG TERM GOAL #7   Title  Patient reports pain increases </= 2 increments on 0-10 scale with standing & gait activities noted above LTGs.     Baseline  08/01/2018 Patient reports pain increases from 0-1/10 to 4-5/10 with standing & gait.     Time  15    Period  Weeks    Status  On-going      PT LONG TERM GOAL #8   Title  Patient ambulates around household furniture with cane & prosthesis carrying plate or cup safely modified independent.      Baseline  08/01/2018 Patient ambulates straight open pathes 59' with cane & prosthesis with mod to min assist.     Time  7    Period  Weeks    Status  New            Plan - 09/13/18 1600    Clinical Impression Statement  Todays skilled session focused on gait with rollator, quad tipped cane while scanning with moderate LOB noted and LE strengthening in standing with BUE support. Pt continues to present with excessive UE reliance and VC's for weight shift. Pt should benefit from continued PT to progress towards unmet goals.     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, left foot wounds with neuropathy    PT Frequency  2x / week   1x/wk for 3 weeks, then 2x/wk for 12 weeks   PT Duration  Other (comment)   15 weeks total for 27 visits permitted with Medicaid   PT Treatment/Interventions  ADLs/Self Care Home Management;Canalith Repostioning;DME Instruction;Gait training;Stair training;Functional mobility training;Therapeutic activities;Therapeutic exercise;Balance training;Neuromuscular re-education;Patient/family education;Prosthetic Training;Orthotic Fit/Training;Manual techniques;Vestibular  PT Next Visit Plan  Continue with high level balance on compliant surfaces, gait outdoors with new rollator and LE strengthening, add LE strengthening to HEP.    Consulted and Agree with Plan of Care  Patient;Family member/caregiver    Family Member Consulted  mother       Patient will benefit from skilled therapeutic intervention in order to improve the following deficits and impairments:  Abnormal gait, Decreased activity tolerance, Decreased balance, Decreased coordination, Decreased endurance, Decreased knowledge of use of DME, Decreased mobility, Decreased range of motion, Decreased skin integrity, Decreased strength, Dizziness, Difficulty walking, Increased edema, Impaired flexibility, Impaired sensation, Postural  dysfunction, Pain, Prosthetic Dependency  Visit Diagnosis: Muscle weakness (generalized)  Unsteadiness on feet  Other abnormalities of gait and mobility  Abnormal posture     Problem List Patient Active Problem List   Diagnosis Date Noted  . 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 Non-pressure chronic ulcer of other part of left foot limited to breakdown of skin ([facility]) 02/02/2018  . 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 History of right below knee amputation ([facility]) 12/22/2017  . Fever   . Acute posthemorrhagic anemia   . Leukocytosis   . Falls 11/24/2017  . Benign essential HTN 11/24/2017  . Normocytic normochromic anemia 11/24/2017  . Sepsis ([facility]) 10/11/2017  . Uncontrolled type 2 diabetes mellitus with hyperglycemia ([facility]) 10/11/2017  . ARF (acute renal failure) ([facility]) 10/11/2017  . Nausea & vomiting 10/11/2017   Chassity Felts, PTA  Chassity A Felts 09/14/2018, 9:04 AM  [facility] [address], [place], [address] Phone: [phone]   Fax:  [phone]  Name: ABID BOLLA MRN: [id number] Date of Birth: 04/19/1971

## 2018-09-16 ENCOUNTER — Other Ambulatory Visit: Payer: Self-pay

## 2018-09-16 ENCOUNTER — Ambulatory Visit: Payer: Medicaid Other | Admitting: Physical Therapy

## 2018-09-16 ENCOUNTER — Encounter: Payer: Self-pay | Admitting: Physical Therapy

## 2018-09-16 DIAGNOSIS — R2681 Unsteadiness on feet: Secondary | ICD-10-CM

## 2018-09-16 DIAGNOSIS — M6281 Muscle weakness (generalized): Secondary | ICD-10-CM | POA: Diagnosis not present

## 2018-09-16 DIAGNOSIS — R2689 Other abnormalities of gait and mobility: Secondary | ICD-10-CM

## 2018-09-17 NOTE — Therapy (Signed)
[facility] [address] [place], [place], [address] Phone: [phone]   Fax:  [phone]  Physical Therapy Treatment  Patient Details  Name: [name] [name] MRN: [id number] Date of Birth: 11/18/1971 Referring Provider (PT): Meridee Score, MD   Encounter Date: 09/16/2018  PT End of Session - 09/16/18 1456    Visit Number  23    Number of Visits  27    Authorization Type  Medicaid    Authorization Time Period  3 visits from 7/12 through 06/23/18; 12 additional visits authorized from 06/24/18- 08/04/18; 12 additional visits authorized from 08/10/18 - 09/20/18.    Authorization - Visit Number  23    Authorization - Number of Visits  27    PT Start Time  6606    PT Stop Time  1445    PT Time Calculation (min)  40 min    Equipment Utilized During Treatment  Gait belt    Behavior During Therapy  WFL for tasks assessed/performed       Past Medical History:  Diagnosis Date  . ARF (acute renal failure) ([facility]) 09/2017  . Diabetes mellitus without complication ([facility])   . Hypertension   . Necrotizing fasciitis ([facility]) 10/13/2017  . Wound dehiscence 11/24/2017    Past Surgical History:  Procedure Laterality Date  . AMPUTATION Right 10/13/2017   Procedure: RIGHT BELOW KNEE AMPUTATION;  Surgeon: [name], MD;  Location: [facility];  Service: Orthopedics;  Laterality: Right;  . AMPUTATION Right 11/24/2017   Procedure: AMPUTATION BELOW KNEE REVISION;  Surgeon: [name], MD;  Location: [facility];  Service: Orthopedics;  Laterality: Right;  . NO PAST SURGERIES      There were no vitals filed for this visit.  Subjective Assessment - 09/16/18 1514    Subjective  No falls to report,    Patient is accompained by:  Family member    Pertinent History  R TTA, DM2, HTN, peripheral neuropathy, acute renal failure    Limitations  Lifting;Standing;Walking;House hold activities    Patient Stated Goals  To use prosthesis to walk in home &  community,     Currently in Pain?  No/denies            [facility] Adult PT Treatment/Exercise - 09/16/18 1433      Ambulation/Gait   Ambulation/Gait  Yes    Ambulation/Gait Assistance  5: Supervision;4: Min guard    Ambulation/Gait Assistance Details  ambulating in clinic, scanning, changing speeds    Ambulation Distance (Feet)  1000 Feet   230 with cane   Assistive device  Rollator;Prosthesis;Straight cane   Rubber quad tipped cane   Gait Pattern  Decreased stance time - right;Step-through pattern;Decreased trunk rotation    Ambulation Surface  Level;Unlevel;Indoor;Outdoor;Paved      High Level Balance   High Level Balance Activities  Negotiating over obstacles;Negotitating around obstacles;Head turns    High Level Balance Comments  with cane/prosthesis: fwd stepping over bolsters of varied heighs with cues on sequencing and up to min assit. figure 8 around hoola hoops on floor with cues on step length for pelvic postioning with min assist. performed head turns with gait around gym with cane/prosthesis- left, right up and down, min guard assist.      Self-Care   Self-Care  Other Self-Care Comments    Other Self-Care Comments   discussed post PT options- free clinics at [facility] and [employer].   Also discussed community fitness options as well. Will continue to  address next week.                   Prosthetics   Current prosthetic wear tolerance (days/week)   daily    Current prosthetic wear tolerance (#hours/day)   most awake hours    Residual limb condition   intact with redness at patella and popliteal fold area    Donning Prosthesis  Modified independent (device/increased time)    Doffing Prosthesis  Modified independent (device/increased time)             PT Education - 09/16/18 1516    Education Details  Educated on importance of skin care    Person(s) Educated  Patient    Methods  Explanation    Comprehension  Verbalized understanding       PT Short Term Goals -  08/01/18 1724      PT SHORT TERM GOAL #1   Title  Patient initiates adjusting ply socks with volume changes with PT guidance on correct number of ply fit. (All STGs Target Date: 07/29/18)    Baseline  MET 08/01/2018  Pt initiates thought to adjust ply socks when fit is not correct with limb volume changes but needs PT guidance with how much to change.     Time  4    Period  Weeks    Status  Achieved      PT SHORT TERM GOAL #2   Title  Patient tolerates daily wear of prosthesis >10 hrs total /day without skin issues.     Baseline  MET 08/01/2018 Patient wear prosthesis 12 hrs /day no skin breakdown but has redness over patella & popliteal areas.    Time  4    Period  Weeks    Status  Achieved      PT SHORT TERM GOAL #3   Title  Patient performs standing balance without UE support looking to side, overhead & downward with supervision.     Baseline  MET 08/01/2018 Patient is able to stand without UE support & scan environment but requires close supervision due to unsteadiness.     Time  4    Period  Weeks    Status  Achieved      PT SHORT TERM GOAL #4   Title  Patient ambulates 100' with rolling walker & prosthesis with supervision.    Baseline  MET 08/01/2018 Patient ambulates up to 150' with RW or rollator walker & prosthesis with supervision.     Time  4    Period  Weeks    Status  Achieved      PT SHORT TERM GOAL #5   Title  Patient negotiates ramps & curbs with RW  and stairs with 2 rails & prosthesis with supervision.     Baseline  MET 08/01/2018  Patient negotiates ramps & curbs with RW with supervision and stairs with 2 rails with supervision.     Time  4    Period  Weeks    Status  Achieved        PT Long Term Goals - 08/04/18 1500      PT LONG TERM GOAL #1   Title  Patient verbalizes & demonstrates understanding of proper prosthetic care to enable safe use of prosthesis.     Baseline  08/01/2018 Patient requires skilled PT cues on residual limb care & how much to adjust ply socks  when his volume changes.     Time  15    Period  Weeks  Status  On-going      PT LONG TERM GOAL #2   Title  Patient tolerates daily prothesis wear >90% of awake hours without skin or limb pain issues.     Baseline  08/01/2018 Patient is tolerating prosthesis wear >12hrs/day with no skin breakdown but redness and limb pain occassionally 4-5/10    Time  15    Period  Weeks    Status  On-going      PT LONG TERM GOAL #3   Title  Patient performs standing balance with rolling walker & prosthesis reaching 10" anteriorly, to floor, manages clothes for toileting, modified independent; task within [place] test with RW support >36/56 safely.     Baseline  08/04/18: with RW support- pt is able to retrieve item from floor, reach 10 inches forward and simulate pulling up clothing with supervision for safety.    Time  15    Period  Weeks    Status  On-going      PT LONG TERM GOAL #4   Title  Patient ambulates 300' with RW & prosthesis modified independent to enable basic community mobility.     Baseline  08/01/2018 patient ambulates up to 150' with rolling walker or rollator walker & prosthesis with supervision/skilled cues for safety & gait efficiency.     Time  15    Period  Weeks    Status  On-going      PT LONG TERM GOAL #5   Title  Patient negotiates ramps, curbs with rolling walker and stairs with 1 rail & cane with prosthesis modified independent for community access.     Baseline  08/01/2018 Patient negotiates ramps, curbs with rollator with close supervision & heavy cues for safety. He negotiates stairs with 1 rail & cane with close supervision & skilled cues.     Time  15    Period  Weeks    Status  On-going      PT LONG TERM GOAL #6   Title  Patient verbalizes understanding of fitness plan.     Baseline  08/01/2018  patient continues to need skilled instruction to progress exercises, fitness & activity tolerance.     Time  15    Period  Weeks    Status  New      PT LONG TERM GOAL #7    Title  Patient reports pain increases </= 2 increments on 0-10 scale with standing & gait activities noted above LTGs.     Baseline  08/01/2018 Patient reports pain increases from 0-1/10 to 4-5/10 with standing & gait.     Time  15    Period  Weeks    Status  On-going      PT LONG TERM GOAL #8   Title  Patient ambulates around household furniture with cane & prosthesis carrying plate or cup safely modified independent.     Baseline  08/01/2018 Patient ambulates straight open pathes 28' with cane & prosthesis with mod to min assist.     Time  7    Period  Weeks    Status  New            Plan - 09/16/18 1458    Clinical Impression Statement  Treatment today focused on gait and challenging patients balance while ambulating. He was able to negotiate over obstacles with LOBx1. Patient demonstrated decreased stance time on R LE. He would benefit from continued therapy to increase enurance and balance.     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, left foot wounds with neuropathy    PT Frequency  2x / week    PT Duration  Other (comment)    PT Treatment/Interventions  ADLs/Self Care Home Management;Canalith Repostioning;DME Instruction;Gait training;Stair training;Functional mobility training;Therapeutic activities;Therapeutic exercise;Balance training;Neuromuscular re-education;Patient/family education;Prosthetic Training;Orthotic Fit/Training;Manual techniques;Vestibular    PT Next Visit Plan  Continue with high level balance on compliant surfaces, gait outdoors with new rollator and LE strengthening, add LE strengthening to HEP.    Consulted and Agree with Plan of Care  Patient       Patient will benefit from skilled therapeutic intervention in order to improve the following deficits and impairments:  Abnormal gait, Decreased activity tolerance, Decreased balance, Decreased coordination, Decreased endurance,  Decreased knowledge of use of DME, Decreased mobility, Decreased range of motion, Decreased skin integrity, Decreased strength, Dizziness, Difficulty walking, Increased edema, Impaired flexibility, Impaired sensation, Postural dysfunction, Pain, Prosthetic Dependency  Visit Diagnosis: Muscle weakness (generalized)  Other abnormalities of gait and mobility  Unsteadiness on feet     Problem List Patient Active Problem List   Diagnosis Date Noted  . 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 Non-pressure chronic ulcer of other part of left foot limited to breakdown of skin ([facility]) 02/02/2018  . 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 History of right below knee amputation ([facility]) 12/22/2017  . Fever   . Acute posthemorrhagic anemia   . Leukocytosis   . Falls 11/24/2017  . Benign essential HTN 11/24/2017  . Normocytic normochromic anemia 11/24/2017  . Sepsis ([facility]) 10/11/2017  . Uncontrolled type 2 diabetes mellitus with hyperglycemia ([facility]) 10/11/2017  . ARF (acute renal failure) ([facility]) 10/11/2017  . Nausea & vomiting 10/11/2017     This note was originally created by [employer], SPTA. Unable to attest/co-sign his note therefore CI created one. I agree with this note as written   Willow Ora, PTA, [facility] [address] [place], [place] [address] [phone] 09/17/18, 4:31 PM   Name: ESWIN WORRELL MRN: [id number] Date of Birth: 1971/04/14

## 2018-09-19 ENCOUNTER — Encounter: Payer: Self-pay | Admitting: Physical Therapy

## 2018-09-19 ENCOUNTER — Ambulatory Visit: Payer: Medicaid Other | Admitting: Physical Therapy

## 2018-09-19 DIAGNOSIS — M6281 Muscle weakness (generalized): Secondary | ICD-10-CM

## 2018-09-19 DIAGNOSIS — R2681 Unsteadiness on feet: Secondary | ICD-10-CM

## 2018-09-19 DIAGNOSIS — R293 Abnormal posture: Secondary | ICD-10-CM

## 2018-09-19 DIAGNOSIS — R2689 Other abnormalities of gait and mobility: Secondary | ICD-10-CM

## 2018-09-20 ENCOUNTER — Ambulatory Visit: Payer: Medicaid Other | Admitting: Physical Therapy

## 2018-09-20 ENCOUNTER — Encounter: Payer: Self-pay | Admitting: Physical Therapy

## 2018-09-20 DIAGNOSIS — R2681 Unsteadiness on feet: Secondary | ICD-10-CM

## 2018-09-20 DIAGNOSIS — M6281 Muscle weakness (generalized): Secondary | ICD-10-CM | POA: Diagnosis not present

## 2018-09-20 DIAGNOSIS — R293 Abnormal posture: Secondary | ICD-10-CM

## 2018-09-20 DIAGNOSIS — R2689 Other abnormalities of gait and mobility: Secondary | ICD-10-CM

## 2018-09-20 NOTE — Therapy (Signed)
[facility] [address] [place], [place], [address] Phone: [phone]   Fax:  [phone]  Physical Therapy Treatment  Patient Details  Name: [name] [name] MRN: [id number] Date of Birth: 11/03/1971 Referring Provider (PT): Meridee Score, MD   Encounter Date: 09/19/2018  PT End of Session - 09/19/18 1559    Visit Number  24    Number of Visits  27    Authorization Type  Medicaid    Authorization Time Period  3 visits from 7/12 through 06/23/18; 12 additional visits authorized from 06/24/18- 08/04/18; 12 additional visits authorized from 08/10/18 - 09/20/18.    Authorization - Visit Number  24    Authorization - Number of Visits  27    PT Start Time  5885    PT Stop Time  1530    PT Time Calculation (min)  45 min    Equipment Utilized During Treatment  Gait belt    Behavior During Therapy  WFL for tasks assessed/performed       Past Medical History:  Diagnosis Date  . ARF (acute renal failure) ([place]) 09/2017  . Diabetes mellitus without complication ([place])   . Hypertension   . Necrotizing fasciitis ([place]) 10/13/2017  . Wound dehiscence 11/24/2017    Past Surgical History:  Procedure Laterality Date  . AMPUTATION Right 10/13/2017   Procedure: RIGHT BELOW KNEE AMPUTATION;  Surgeon: [name], MD;  Location: [facility];  Service: Orthopedics;  Laterality: Right;  . AMPUTATION Right 11/24/2017   Procedure: AMPUTATION BELOW KNEE REVISION;  Surgeon: [name], MD;  Location: [facility];  Service: Orthopedics;  Laterality: Right;  . NO PAST SURGERIES      There were no vitals filed for this visit.  Subjective Assessment - 09/19/18 1444    Subjective  He has been using the rollator walker to get out in community more. No falls. He is wearing prosthesis all awake hours.     Patient is accompained by:  Family member    Pertinent History  R TTA, DM2, HTN, peripheral neuropathy, acute renal failure    Limitations   Lifting;Standing;Walking;House hold activities    Patient Stated Goals  To use prosthesis to walk in home & community,     Currently in Pain?  No/denies         [facility] PT Assessment - 09/19/18 1445      Berg Balance Test   Sit to Stand  Able to stand  independently using hands   with RW = 3   Standing Unsupported  Able to stand safely 2 minutes   with RW = 4   Sitting with Back Unsupported but Feet Supported on Floor or Stool  Able to sit safely and securely 2 minutes    Stand to Sit  Controls descent by using hands   with RW = 3   Transfers  Able to transfer safely, definite need of hands    Standing Unsupported with Eyes Closed  Unable to keep eyes closed 3 seconds but stays steady   with RW = 4   Standing Ubsupported with Feet Together  Needs help to attain position but able to stand for 30 seconds with feet together   with RW = 3   From Standing, Reach Forward with Outstretched Arm  Reaches forward but needs supervision   with RW = 4   From Standing Position, Pick up Object from Floor  Unable to try/needs assist to keep balance   with  RW = 3   From Standing Position, Turn to Look Behind Over each Shoulder  Needs supervision when turning   with RW = 3   Turn 360 Degrees  Needs assistance while turning   with RW = 2   Standing Unsupported, Alternately Place Feet on Step/Stool  Needs assistance to keep from falling or unable to try   with RW = 3   Standing Unsupported, One Foot in [employer] balance while stepping or standing   with RW = 3   Standing on One Leg  Unable to try or needs assist to prevent fall   with RW = 4   Total Score  21    Berg comment:  Today tasks with RW support 46/56;  Initial Merrilee Jansky 10/56 & tasks with RW support 20/56      Prosthetics Assessment - 09/19/18 [place] [address] with  Skin check;Residual limb care;Care of non-amputated limb;Prosthetic cleaning;Ply sock cleaning;Correct ply sock adjustment;Proper wear  schedule/adjustment;Proper weight-bearing schedule/adjustment    Donning prosthesis   Modified independent (Device/Increase time)    Doffing prosthesis   Modified independent (Device/Increase time)    Current prosthetic wear tolerance (days/week)   daily    Current prosthetic wear tolerance (#hours/day)   most awake hours    Edema  none    Residual limb condition   intact with intermittent redness with weightbearing in socket. no open areas.                   [facility] Adult PT Treatment/Exercise - 09/19/18 1445      Transfers   Transfers  Sit to Stand;Stand to Sit    Sit to Stand  6: Modified independent (Device/Increase time);With upper extremity assist;From chair/3-in-1    Stand to Sit  6: Modified independent (Device/Increase time);With upper extremity assist;To chair/3-in-1      Ambulation/Gait   Ambulation/Gait  Yes    Ambulation/Gait Assistance  6: Modified independent (Device/Increase time)    Ambulation Distance (Feet)  300 Feet   300' outdoors on grass 50'   Assistive device  Rollator;Prosthesis;Straight cane    Gait Pattern  Step-through pattern;Decreased stance time - left;Antalgic;Lateral hip instability    Ambulation Surface  Indoor;Level;Outdoor;Paved;Grass    Ramp  6: Modified independent (Device)   rollator walker & TTA prosthesis   Curb  6: Modified independent (Device/increase time)   rollator walker & prosthesis              PT Short Term Goals - 08/01/18 1724      PT SHORT TERM GOAL #1   Title  Patient initiates adjusting ply socks with volume changes with PT guidance on correct number of ply fit. (All STGs Target Date: 07/29/18)    Baseline  MET 08/01/2018  Pt initiates thought to adjust ply socks when fit is not correct with limb volume changes but needs PT guidance with how much to change.     Time  4    Period  Weeks    Status  Achieved      PT SHORT TERM GOAL #2   Title  Patient tolerates daily wear of prosthesis >10 hrs total /day without  skin issues.     Baseline  MET 08/01/2018 Patient wear prosthesis 12 hrs /day no skin breakdown but has redness over patella & popliteal areas.    Time  4    Period  Weeks    Status  Achieved  PT SHORT TERM GOAL #3   Title  Patient performs standing balance without UE support looking to side, overhead & downward with supervision.     Baseline  MET 08/01/2018 Patient is able to stand without UE support & scan environment but requires close supervision due to unsteadiness.     Time  4    Period  Weeks    Status  Achieved      PT SHORT TERM GOAL #4   Title  Patient ambulates 100' with rolling walker & prosthesis with supervision.    Baseline  MET 08/01/2018 Patient ambulates up to 150' with RW or rollator walker & prosthesis with supervision.     Time  4    Period  Weeks    Status  Achieved      PT SHORT TERM GOAL #5   Title  Patient negotiates ramps & curbs with RW  and stairs with 2 rails & prosthesis with supervision.     Baseline  MET 08/01/2018  Patient negotiates ramps & curbs with RW with supervision and stairs with 2 rails with supervision.     Time  4    Period  Weeks    Status  Achieved        PT Long Term Goals - 09/19/18 1610      PT LONG TERM GOAL #1   Title  Patient verbalizes & demonstrates understanding of proper prosthetic care to enable safe use of prosthesis.     Baseline  MET 09/19/2018     Time  15    Period  Weeks    Status  Achieved      PT LONG TERM GOAL #2   Title  Patient tolerates daily prothesis wear >90% of awake hours without skin or limb pain issues.     Baseline  MET 09/19/2018    Time  15    Period  Weeks    Status  Achieved      PT LONG TERM GOAL #3   Title  Patient performs standing balance with rolling walker & prosthesis reaching 10" anteriorly, to floor, manages clothes for toileting, modified independent; task within [place] test with RW support >36/56 safely.     Baseline  MET 09/19/2018.  Berg Balance improved from 10/56 to 21/56 and  tasks of Berg with RW support from 20/56 to 46/56.    Time  15    Period  Weeks    Status  Achieved      PT LONG TERM GOAL #4   Title  Patient ambulates 300' with RW & prosthesis modified independent to enable basic community mobility.     Baseline  MET 09/19/2018    Time  15    Period  Weeks    Status  Achieved      PT LONG TERM GOAL #5   Title  Patient negotiates ramps, curbs with rolling walker and stairs with 1 rail & cane with prosthesis modified independent for community access.     Baseline  MET 09/19/2018 with rollator walker    Time  15    Period  Weeks    Status  Achieved      PT LONG TERM GOAL #6   Title  Patient verbalizes understanding of fitness plan.     Time  15    Period  Weeks    Status  On-going      PT LONG TERM GOAL #7   Title  Patient reports pain increases </= 2 increments  on 0-10 scale with standing & gait activities noted above LTGs.     Time  15    Period  Weeks    Status  On-going      PT LONG TERM GOAL #8   Title  Patient ambulates around household furniture with cane & prosthesis carrying plate or cup safely modified independent.     Time  7    Period  Weeks    Status  On-going            Plan - 09/19/18 1614    Clinical Impression Statement  Patient met first 5 LTGs checked today. He is ambulating with rollator walker & TTA prosthesis in community.     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, left foot wounds with neuropathy    PT Frequency  2x / week    PT Duration  Other (comment)    PT Treatment/Interventions  ADLs/Self Care Home Management;Canalith Repostioning;DME Instruction;Gait training;Stair training;Functional mobility training;Therapeutic activities;Therapeutic exercise;Balance training;Neuromuscular re-education;Patient/family education;Prosthetic Training;Orthotic Fit/Training;Manual techniques;Vestibular    PT Next Visit Plan  assess remaining  LTGs    Consulted and Agree with Plan of Care  Patient       Patient will benefit from skilled therapeutic intervention in order to improve the following deficits and impairments:  Abnormal gait, Decreased activity tolerance, Decreased balance, Decreased coordination, Decreased endurance, Decreased knowledge of use of DME, Decreased mobility, Decreased range of motion, Decreased skin integrity, Decreased strength, Dizziness, Difficulty walking, Increased edema, Impaired flexibility, Impaired sensation, Postural dysfunction, Pain, Prosthetic Dependency  Visit Diagnosis: Muscle weakness (generalized)  Other abnormalities of gait and mobility  Unsteadiness on feet  Abnormal posture     Problem List Patient Active Problem List   Diagnosis Date Noted  . 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 Non-pressure chronic ulcer of other part of left foot limited to breakdown of skin ([place]) 02/02/2018  . 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 History of right below knee amputation ([place]) 12/22/2017  . Fever   . Acute posthemorrhagic anemia   . Leukocytosis   . Falls 11/24/2017  . Benign essential HTN 11/24/2017  . Normocytic normochromic anemia 11/24/2017  . Sepsis ([place]) 10/11/2017  . Uncontrolled type 2 diabetes mellitus with hyperglycemia ([place]) 10/11/2017  . ARF (acute renal failure) ([place]) 10/11/2017  . Nausea & vomiting 10/11/2017    Jamey Reas PT, DPT 09/20/2018, 6:17 AM  [facility] [address] [place], [place], [address] Phone: [phone]   Fax:  [phone]  Name: [name] [name] MRN: [id number] Date of Birth: 1971/08/18

## 2018-09-21 NOTE — Therapy (Signed)
[facility] [address] [place], [place], [address] Phone: [phone]   Fax:  [phone]   PHYSICAL THERAPY DISCHARGE SUMMARY  Visits from Start of Care: 25  Current functional level related to goals / functional outcomes: See below   Remaining deficits: See below   Education / Equipment: Prosthetic care & HEP.  He acquired a rollator walker.   Plan: Patient agrees to discharge.  Patient goals were met. Patient is being discharged due to meeting the stated rehab goals.  ?????       Physical Therapy Treatment  Patient Details  Name: [name] [name] MRN: [id number] Date of Birth: 24-Mar-1971 Referring Provider (PT): Aldean Baker, MD   Encounter Date: 09/20/2018  PT End of Session - 09/20/18 1800    Visit Number  25    Number of Visits  27    Authorization Type  Medicaid    Authorization Time Period  3 visits from 7/12 through 06/23/18; 12 additional visits authorized from 06/24/18- 08/04/18; 12 additional visits authorized from 08/10/18 - 09/20/18.    Authorization - Visit Number  25    Authorization - Number of Visits  27    PT Start Time  1445    PT Stop Time  1525    PT Time Calculation (min)  40 min    Equipment Utilized During Treatment  Gait belt    Behavior During Therapy  WFL for tasks assessed/performed       Past Medical History:  Diagnosis Date  . ARF (acute renal failure) ([facility]) 09/2017  . Diabetes mellitus without complication ([facility])   . Hypertension   . Necrotizing fasciitis ([facility]) 10/13/2017  . Wound dehiscence 11/24/2017    Past Surgical History:  Procedure Laterality Date  . AMPUTATION Right 10/13/2017   Procedure: RIGHT BELOW KNEE AMPUTATION;  Surgeon: Nadara Mustard, MD;  Location: [facility];  Service: Orthopedics;  Laterality: Right;  . AMPUTATION Right 11/24/2017   Procedure: AMPUTATION BELOW KNEE REVISION;  Surgeon: Nadara Mustard, MD;  Location: [facility];  Service: Orthopedics;   Laterality: Right;  . NO PAST SURGERIES      There were no vitals filed for this visit.  Subjective Assessment - 09/20/18 1445    Subjective  He feels that his gait & mobility has improved with PT.     Patient is accompained by:  Family member    Pertinent History  R TTA, DM2, HTN, peripheral neuropathy, acute renal failure    Limitations  Lifting;Standing;Walking;House hold activities    Patient Stated Goals  To use prosthesis to walk in home & community,     Currently in Pain?  No/denies         [name] PT Assessment - 09/20/18 1445      Ambulation/Gait   Gait velocity  1.37 ft/sec cane & 2.43 ft/sec   Initial vellocity with RW 0.32 ft/sec                  [facility] Adult PT Treatment/Exercise - 09/20/18 1445      Ambulation/Gait   Ambulation/Gait  Yes    Ambulation/Gait Assistance  6: Modified independent (Device/Increase time)    Ambulation/Gait Assistance Details  carrying plate of "food" around furniture    Ambulation Distance (Feet)  100 Feet   100' X 2   Assistive device  Straight cane;Prosthesis    Ambulation Surface  Indoor;Level    Stairs  Yes    Stairs Assistance  6: Modified independent (  Device/Increase time)    Stair Management Technique  One rail Left;One rail Right;With cane;Step to pattern;Forwards             PT Education - 09/20/18 1500    Education Details  using cane in home & rollator for community    Person(s) Educated  Patient    Methods  Explanation;Verbal cues    Comprehension  Verbalized understanding       PT Short Term Goals - 08/01/18 1724      PT SHORT TERM GOAL #1   Title  Patient initiates adjusting ply socks with volume changes with PT guidance on correct number of ply fit. (All STGs Target Date: 07/29/18)    Baseline  MET 08/01/2018  Pt initiates thought to adjust ply socks when fit is not correct with limb volume changes but needs PT guidance with how much to change.     Time  4    Period  Weeks    Status  Achieved       PT SHORT TERM GOAL #2   Title  Patient tolerates daily wear of prosthesis >10 hrs total /day without skin issues.     Baseline  MET 08/01/2018 Patient wear prosthesis 12 hrs /day no skin breakdown but has redness over patella & popliteal areas.    Time  4    Period  Weeks    Status  Achieved      PT SHORT TERM GOAL #3   Title  Patient performs standing balance without UE support looking to side, overhead & downward with supervision.     Baseline  MET 08/01/2018 Patient is able to stand without UE support & scan environment but requires close supervision due to unsteadiness.     Time  4    Period  Weeks    Status  Achieved      PT SHORT TERM GOAL #4   Title  Patient ambulates 100' with rolling walker & prosthesis with supervision.    Baseline  MET 08/01/2018 Patient ambulates up to 150' with RW or rollator walker & prosthesis with supervision.     Time  4    Period  Weeks    Status  Achieved      PT SHORT TERM GOAL #5   Title  Patient negotiates ramps & curbs with RW  and stairs with 2 rails & prosthesis with supervision.     Baseline  MET 08/01/2018  Patient negotiates ramps & curbs with RW with supervision and stairs with 2 rails with supervision.     Time  4    Period  Weeks    Status  Achieved        PT Long Term Goals - 09/20/18 1800      PT LONG TERM GOAL #1   Title  Patient verbalizes & demonstrates understanding of proper prosthetic care to enable safe use of prosthesis.     Baseline  MET 09/19/2018     Time  15    Period  Weeks    Status  Achieved      PT LONG TERM GOAL #2   Title  Patient tolerates daily prothesis wear >90% of awake hours without skin or limb pain issues.     Baseline  MET 09/19/2018    Time  15    Period  Weeks    Status  Achieved      PT LONG TERM GOAL #3   Title  Patient performs standing balance with rolling walker &  prosthesis reaching 10" anteriorly, to floor, manages clothes for toileting, modified independent; task within [place] test with RW  support >36/56 safely.     Baseline  MET 09/19/2018.  Berg Balance improved from 10/56 to 21/56 and tasks of Berg with RW support from 20/56 to 46/56.    Time  15    Period  Weeks    Status  Achieved      PT LONG TERM GOAL #4   Title  Patient ambulates 300' with RW & prosthesis modified independent to enable basic community mobility.     Baseline  MET 09/19/2018    Time  15    Period  Weeks    Status  Achieved      PT LONG TERM GOAL #5   Title  Patient negotiates ramps, curbs with rolling walker and stairs with 1 rail & cane with prosthesis modified independent for community access.     Baseline  MET 09/19/2018 with rollator walker    Time  15    Period  Weeks    Status  Achieved      PT LONG TERM GOAL #6   Title  Patient verbalizes understanding of fitness plan.     Baseline  MET 09/20/2018    Time  15    Period  Weeks    Status  Achieved      PT LONG TERM GOAL #7   Title  Patient reports pain increases </= 2 increments on 0-10 scale with standing & gait activities noted above LTGs.     Baseline  MET 09/20/2018    Time  15    Period  Weeks    Status  Achieved      PT LONG TERM GOAL #8   Title  Patient ambulates around household furniture with cane & prosthesis carrying plate or cup safely modified independent.     Baseline  MET 09/20/2018    Time  7    Period  Weeks    Status  Achieved            Plan - 09/20/18 1800    Clinical Impression Statement  Patient met 8 of 8 LTGs. He appears to be safely using prosthesis most of his awake hours without issues. He can ambulate limited distances like in home with cane & prosthesis. He ambulates at community level with rollator walker including ramps, curbs & grass or Stairs with single rail    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, left foot wounds with neuropathy    PT Frequency  2x / week    PT Duration  Other (comment)    PT  Treatment/Interventions  ADLs/Self Care Home Management;Canalith Repostioning;DME Instruction;Gait training;Stair training;Functional mobility training;Therapeutic activities;Therapeutic exercise;Balance training;Neuromuscular re-education;Patient/family education;Prosthetic Training;Orthotic Fit/Training;Manual techniques;Vestibular    PT Next Visit Plan  discharge PT    Consulted and Agree with Plan of Care  Patient       Patient will benefit from skilled therapeutic intervention in order to improve the following deficits and impairments:  Abnormal gait, Decreased activity tolerance, Decreased balance, Decreased coordination, Decreased endurance, Decreased knowledge of use of DME, Decreased mobility, Decreased range of motion, Decreased skin integrity, Decreased strength, Dizziness, Difficulty walking, Increased edema, Impaired flexibility, Impaired sensation, Postural dysfunction, Pain, Prosthetic Dependency  Visit Diagnosis: Muscle weakness (generalized)  Other abnormalities of gait and mobility  Unsteadiness on feet  Abnormal posture     Problem List  Patient Active Problem List   Diagnosis Date Noted  . 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 Non-pressure chronic ulcer of other part of left foot limited to breakdown of skin ([facility]) 02/02/2018  . 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 History of right below knee amputation ([facility]) 12/22/2017  . Fever   . Acute posthemorrhagic anemia   . Leukocytosis   . Falls 11/24/2017  . Benign essential HTN 11/24/2017  . Normocytic normochromic anemia 11/24/2017  . Sepsis ([facility]) 10/11/2017  . Uncontrolled type 2 diabetes mellitus with hyperglycemia ([facility]) 10/11/2017  . ARF (acute renal failure) ([facility]) 10/11/2017  . Nausea & vomiting 10/11/2017    Vladimir Faster PT, DPT 09/21/2018, 12:01 PM  [facility] [address] [place], [place], [address] Phone:  [phone]   Fax:  [phone]  Name: YING RAMSOUR MRN: [id number] Date of Birth: 09/23/1971

## 2018-10-22 ENCOUNTER — Other Ambulatory Visit: Payer: Self-pay | Admitting: Internal Medicine

## 2019-01-27 ENCOUNTER — Encounter: Payer: Self-pay | Admitting: Gastroenterology

## 2019-01-27 ENCOUNTER — Other Ambulatory Visit: Payer: Self-pay | Admitting: Internal Medicine

## 2019-02-24 ENCOUNTER — Ambulatory Visit: Payer: Medicaid Other | Admitting: Gastroenterology

## 2019-04-27 ENCOUNTER — Encounter: Payer: Self-pay | Admitting: General Surgery

## 2019-04-28 ENCOUNTER — Ambulatory Visit (INDEPENDENT_AMBULATORY_CARE_PROVIDER_SITE_OTHER): Payer: 59 | Admitting: Gastroenterology

## 2019-04-28 ENCOUNTER — Other Ambulatory Visit: Payer: Self-pay

## 2019-04-28 ENCOUNTER — Encounter: Payer: Self-pay | Admitting: Gastroenterology

## 2019-04-28 ENCOUNTER — Telehealth: Payer: Self-pay

## 2019-04-28 VITALS — Ht 77.0 in | Wt 245.0 lb

## 2019-04-28 DIAGNOSIS — R195 Other fecal abnormalities: Secondary | ICD-10-CM

## 2019-04-28 DIAGNOSIS — K5909 Other constipation: Secondary | ICD-10-CM

## 2019-04-28 DIAGNOSIS — E1140 Type 2 diabetes mellitus with diabetic neuropathy, unspecified: Secondary | ICD-10-CM | POA: Diagnosis not present

## 2019-04-28 MED ORDER — POLYETHYLENE GLYCOL 3350 17 G PO PACK: 17.0000 g | PACK | Freq: Every day | ORAL | 2 refills | Status: DC

## 2019-04-28 MED ORDER — PEG 3350-KCL-NA BICARB-NACL 420 G PO SOLR: 4000.0000 mL | Freq: Once | ORAL | 0 refills | Status: AC

## 2019-04-28 NOTE — Progress Notes (Signed)
This patient contacted our office requesting a physician telemedicine consultation regarding clinical questions and/or test results. Interactive audio and video telecommunications were attempted between this provider and the patient.  However, this technology failed due to the patient having technical difficulties OR they did not have access to video capabilities.  We continued and completed the visit with audio only.  If new patient, they were referred by see below  Participants on the conference : myself and patient   The patient consented to this consultation and was aware that a charge will be placed through their insurance.  I was in my office and the patient was at home   Encounter time:  Total time 45 minutes, with 32 minutes spent with patient on doximity and telephone    _____________________________________________________________________________________________              [facility] Consult Note:  History: [name] [name] 04/28/2019  Referring provider: [name], NP ([place])  Reason for consult/chief complaint: heme pos stool  Subjective  HPI: Partially legible hand written primary care notes indicate positive fecal occult blood test late January of this year. [name] [name] is a limited historian with poor health literacy.  He describes about a year and a half of chronic constipation since his hospitalization for amputation with subsequent infection.  He spent some time at a rehab facility and recalls being given various laxatives.  Since then, he uses a stool softener, but does not think it helps much.  He has a small bowel movement about every 4 to 5 days often with straining.  He has not seen any blood on the paper or toilet bowl with bowel movements.   No dysphagia, odynophagia, N/V or weight change  No prior GI evaluation, no prior endoscopic procedures.  ROS:  Review of Systems  Constitutional: Positive for  fatigue. Negative for appetite change and unexpected weight change.  HENT: Negative for mouth sores and voice change.   Eyes: Negative for pain and redness.  Respiratory: Negative for cough and shortness of breath.   Cardiovascular: Negative for chest pain and palpitations.  Genitourinary: Negative for dysuria and hematuria.  Musculoskeletal: Positive for arthralgias. Negative for myalgias.  Skin: Negative for pallor and rash.  Neurological: Negative for weakness and headaches.       Neuropathic pain in legs  Hematological: Negative for adenopathy.  Psychiatric/Behavioral:       Depression     Past Medical History: Past Medical History:  Diagnosis Date  . ARF (acute renal failure) ([place]) 09/2017  . Diabetes mellitus without complication ([place])   . Hypertension   . Necrotizing fasciitis ([place]) 10/13/2017  . 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 AMPUTATION Right 10/13/2017   Procedure: RIGHT BELOW KNEE AMPUTATION;  Surgeon: [name], MD;  Location: [facility];  Service: Orthopedics;  Laterality: Right;  . AMPUTATION Right 11/24/2017   Procedure: AMPUTATION BELOW KNEE REVISION;  Surgeon: [name], MD;  Location: [facility];  Service: Orthopedics;  Laterality: Right;  . NO PAST SURGERIES     Has prosthetic and able to ambulate  Family History: Family History  Problem Relation Age of Onset  . Diabetes Mellitus II Mother   . Depression Mother   . Heart disease Father        CABG x 4.  04/2017  . Colon cancer Neg  Hx   . Esophageal cancer Neg Hx   . Liver cancer Neg Hx   . Pancreatic cancer Neg Hx   . Rectal cancer Neg Hx   . Stomach cancer Neg Hx     Social History: Social History   Socioeconomic History  . Marital status: Divorced    Spouse name: Not on file  . Number of children: 1  . Years of education: 62   . Highest education level: Associate degree: occupational, Hotel manager, or vocational program  Occupational History  . Occupation: unemployed  Social Needs  . Financial resource strain: Somewhat hard  . Food insecurity:    Worry: Not on file    Inability: Not on file  . Transportation needs:    Medical: Not on file    Non-medical: Not on file  Tobacco Use  . Smoking status: Current Every Day Smoker    Packs/day: 0.25    Years: 35.00    Pack years: 8.75    Types: Cigarettes  . Smokeless tobacco: Never Used  . Tobacco comment: stopped, but back on 1 cigarette daily.  Substance and Sexual Activity  . Alcohol use: No    Frequency: Never  . Drug use: No  . Sexual activity: Not on file  Lifestyle  . Physical activity:    Days per week: Not on file    Minutes per session: Not on file  . Stress: Not on file  Relationships  . Social connections:    Talks on phone: Not on file    Gets together: Not on file    Attends religious service: Not on file    Active member of club or organization: Not on file    Attends meetings of clubs or organizations: Not on file    Relationship status: Not on file  Other Topics Concern  . Not on file  Social History Narrative   Originally from  [place].   Is living with his mother, who essentially supports him now.    Allergies: Allergies  Allergen Reactions  . Adhesive [Tape] Rash    Rash at site of tape--okay to use paper tape    Outpatient Meds: Current Outpatient Medications  Medication Sig Dispense Refill  . AGAMATRIX ULTRA-THIN LANCETS MISC Check blood sugars before meals twice daily 100 each 11  . amitriptyline (ELAVIL) 100 MG tablet TK 2 TS PO HS    . amLODipine (NORVASC) 10 MG tablet Take 10 mg by mouth daily.    . baclofen (LIORESAL) 10 MG tablet 1/2 tab twice a day 30 each 6  . Blood Glucose Monitoring Suppl (AGAMATRIX PRESTO) w/Device KIT Check sugars twice daily before meals 1 kit 0  . docusate sodium (COLACE) 100 MG  capsule Take 1 capsule (100 mg total) by mouth 2 (two) times daily. 10 capsule 0  . DULoxetine (CYMBALTA) 60 MG capsule TAKE 1 CAPSULE BY MOUTH 2 TIMES DAILY    . fluticasone (FLONASE) 50 MCG/ACT nasal spray INSTILL 2 SPRAYS INTO EACH NOSTRIL HS    . gabapentin (NEURONTIN) 300 MG capsule 1 cap by mouth in morning and midday, 2 caps by mouth at bedtime (Patient taking differently: Take 300 mg by mouth 3 (three) times daily. ) 120 capsule 11  . glipiZIDE (GLUCOTROL) 5 MG tablet 1/2 tab by mouth twice daily with meal (Patient taking differently: Take 2.5 mg by mouth 2 (two) times daily with a meal. ) 15 tablet 11  . glucose blood (AGAMATRIX PRESTO TEST) test strip Check blood sugars twice daily  before meals 100 each 12  . insulin aspart (NOVOLOG) 100 UNIT/ML injection Inject 0-5 Units into the skin 3 (three) times daily with meals. Insulin sliding scale 10 mL 11  . lisinopril (PRINIVIL,ZESTRIL) 5 MG tablet Take 1 tablet (5 mg total) by mouth daily. 30 tablet 11  . metFORMIN (GLUCOPHAGE) 1000 MG tablet Take 1 tablet (1,000 mg total) by mouth 2 (two) times daily with a meal. 60 tablet 11  . polyethylene glycol (MIRALAX / GLYCOLAX) 17 g packet Take 17 g by mouth daily. 30 each 2   No current facility-administered medications for this visit.     He reports no recent changes in diabetic meds  ___________________________________________________________________ Objective   Exam:  Ht '6\' 5"'  (1.956 m)   Wt 245 lb (111.1 kg)   BMI 29.05 kg/m    No exam-virtual visit  Labs:  Most recent labs included with primary care referral are from January 11, 2019  WBC 14.3, hemoglobin 12.6, MCV 86, platelets 220  Hemoglobin had been 11.6 in late January CMP normal in February , also hemoglobin A1c 6.1  On 12/12/2018: Iron 83, TIBC 290, percent saturation 30, ferritin 85   Assessment: Encounter Diagnoses  Name Primary?  . Heme positive stool Yes  . Chronic constipation   . Type 2 diabetes mellitus  with diabetic neuropathy, without long-term current use of insulin (HCC)     Multifactorial constipation, poor mobility, low dietary fiber, perhaps medication side effect, insufficient fluid intake. Reported heme positive stool of unclear significance.  I advised him to have a colonoscopy, and he is agreeable after discussion of procedure and risks.  The benefits and risks of the planned procedure were described in detail with the patient or (when appropriate) their health care proxy.  Risks were outlined as including, but not limited to, bleeding, infection, perforation, adverse medication reaction leading to cardiac or pulmonary decompensation.  The limitation of incomplete mucosal visualization was also discussed.  No guarantees or warranties were given.   Plan:  My office will contact him to schedule a colonoscopy.  I also started him on MiraLAX 1 packet in liquid once daily.  He can increase to twice daily if needed.  Special instructions regarding preparation will be given to patient as regards his diabetic medicines and getting adequate preparation in the setting of severe constipation.   Thank you for the courtesy of this consult.  Please call [name] with any questions or concerns.  Nelida Meuse III  CC: Referring provider noted above

## 2019-04-28 NOTE — Telephone Encounter (Signed)
-----   Message from [place], MD sent at 04/28/2019 10:58 AM EDT ----- Please review my note and contact this patient to schedule colonoscopy for heme positive stool.  He has severe chronic constipation, and I started him on once daily MiraLAX.  To ensure adequate bowel preparation I would like him to do the following:  1 packet/capful of MiraLAX twice daily for the 3 days leading up to prep day His prep should be a 4 L PEG prep split dose starting at 4 PM evening before to finish 2 L, another 2 L the following morning.  He is diabetic, and I would like him to have his procedure mid-to-late morning. No metformin day before or day of procedure

## 2019-04-28 NOTE — Telephone Encounter (Signed)
Patient has been scheduled for 05-11-2019. He will come next week to pick up instructions and sign consents.

## 2019-05-04 NOTE — Progress Notes (Signed)
Routed to Dr Amil Amen and NP Burt Ek.

## 2019-05-09 ENCOUNTER — Telehealth: Payer: Self-pay | Admitting: Gastroenterology

## 2019-05-09 NOTE — Telephone Encounter (Signed)
Pt is scheduled for a colonoscopy on Thursday.  He has lost his prep instructions and reported that he has not been on a low-fibre diet 5 days pre procedure.

## 2019-05-09 NOTE — Telephone Encounter (Signed)
Left a message to call . Did give clear liquid diet instructions on phone. Needs to come to the office. Needs to sign the consent forms.

## 2019-05-10 ENCOUNTER — Telehealth: Payer: Self-pay | Admitting: Gastroenterology

## 2019-05-10 NOTE — Telephone Encounter (Signed)
Left message to call back to ask Covid-19 screening questions. ° °Covid-19 Screening Questions: °  °Do you now or have you had a fever in the last 14 days?  °  °Do you have any respiratory symptoms of shortness of breath or cough now or in the last 14 days?  °  °Do you have any family members or close contacts with diagnosed or suspected Covid-19 in the past 14 days?  °  °Have you been tested for Covid-19 and found to be positive?  ° °Pls inform pt that care partner may wait in the car or come up to the lobby during the procedure but will need to provide their own mask. °  ° °

## 2019-05-11 ENCOUNTER — Encounter: Payer: Self-pay | Admitting: Gastroenterology

## 2019-05-11 ENCOUNTER — Ambulatory Visit (AMBULATORY_SURGERY_CENTER): Payer: 59 | Admitting: Gastroenterology

## 2019-05-11 ENCOUNTER — Encounter: Payer: 59 | Admitting: Gastroenterology

## 2019-05-11 ENCOUNTER — Other Ambulatory Visit: Payer: Self-pay

## 2019-05-11 VITALS — BP 125/92 | HR 93 | Temp 98.3°F | Resp 12 | Ht 77.0 in | Wt 245.0 lb

## 2019-05-11 DIAGNOSIS — R195 Other fecal abnormalities: Secondary | ICD-10-CM | POA: Diagnosis not present

## 2019-05-11 DIAGNOSIS — K5909 Other constipation: Secondary | ICD-10-CM | POA: Diagnosis not present

## 2019-05-11 DIAGNOSIS — D123 Benign neoplasm of transverse colon: Secondary | ICD-10-CM | POA: Diagnosis not present

## 2019-05-11 HISTORY — PX: COLONOSCOPY: SHX174

## 2019-05-11 MED ORDER — SODIUM CHLORIDE 0.9 % IV SOLN: 500.0000 mL | Freq: Once | INTRAVENOUS | Status: DC

## 2019-05-11 NOTE — Op Note (Signed)
[facility] Patient Name: [name] [name] Procedure Date: 05/11/2019 7:41 AM MRN: [id number] Endoscopist: Mallie Mussel L. Loletha Carrow , MD Age: 48 Referring MD:  Date of Birth: [birth year] Gender: Male Account #: [id number] Procedure:                Colonoscopy Indications:              Heme positive stool, Constipation Medicines:                Monitored Anesthesia Care Procedure:                Pre-Anesthesia Assessment:                           - Prior to the procedure, a History and Physical                            was performed, and patient medications and                            allergies were reviewed. The patient's tolerance of                            previous anesthesia was also reviewed. The risks                            and benefits of the procedure and the sedation                            options and risks were discussed with the patient.                            All questions were answered, and informed consent                            was obtained. Prior Anticoagulants: The patient has                            taken no previous anticoagulant or antiplatelet                            agents. ASA Grade Assessment: III - A patient with                            severe systemic disease. After reviewing the risks                            and benefits, the patient was deemed in                            satisfactory condition to undergo the procedure.                           After obtaining informed consent, the colonoscope  was passed under direct vision. Throughout the                            procedure, the patient's blood pressure, pulse, and                            oxygen saturations were monitored continuously. The                            Colonoscope was introduced through the anus and                            advanced to the the cecum, identified by                            appendiceal orifice and ileocecal  valve. The                            colonoscopy was somewhat difficult due to poor                            bowel prep and significant looping. Successful                            completion of the procedure was aided by using                            manual pressure and lavage. The patient tolerated                            the procedure well. The quality of the bowel                            preparation was poor. The ileocecal valve,                            appendiceal orifice, and rectum were photographed. Scope In: 7:47:15 AM Scope Out: 8:13:03 AM Scope Withdrawal Time: 0 hours 18 minutes 4 seconds  Total Procedure Duration: 0 hours 25 minutes 48 seconds  Findings:                 The perianal and digital rectal examinations were                            normal.                           Copious quantities of semi-liquid stool was found                            in the entire colon. Lavage of the area was                            performed using a large amount, resulting in  incomplete clearance with continued fair                            visualization.                           Three semi-pedunculated polyps were found in the                            splenic flexure and proximal transverse colon. The                            polyps were 6 to 8 mm in size. These polyps were                            removed with a hot snare. Resection was complete,                            but the polyp tissue was only partially retrieved.                           Internal hemorrhoids were found.                           The exam was otherwise without abnormality on                            direct and retroflexion views. Complications:            No immediate complications. Estimated Blood Loss:     Estimated blood loss was minimal. Impression:               - Preparation of the colon was poor.                           - Stool in the  entire examined colon.                           - Three 6 to 8 mm polyps at the splenic flexure and                            in the proximal transverse colon, removed with a                            hot snare. Complete resection. Partial retrieval.                           - Internal hemorrhoids.                           - The examination was otherwise normal on direct                            and retroflexion views.  No cause for heme positive stool found (given                            limitations of prep). Recommendation:           - Patient has a contact number available for                            emergencies. The signs and symptoms of potential                            delayed complications were discussed with the                            patient. Return to normal activities tomorrow.                            Written discharge instructions were provided to the                            patient.                           - Resume previous diet.                           - Continue present medications.                           - Await pathology results.                           - Repeat colonoscopy in 1 year for surveillance                            (2-day prep required). Solene Hereford L. Loletha Carrow, MD 05/11/2019 8:21:46 AM This report has been signed electronically.

## 2019-05-11 NOTE — Progress Notes (Signed)
Report given to PACU, vss 

## 2019-05-11 NOTE — Progress Notes (Signed)
Called to room to assist during endoscopic procedure.  Patient ID and intended procedure confirmed with present staff. Received instructions for my participation in the procedure from the performing physician.  

## 2019-05-11 NOTE — Patient Instructions (Signed)
Thank you for allowing [place] to participate in your care today!  Await pathology results by mail, approximately 1-2 weeks.    Recommend another colonoscopy in 1 year with a 2 day prep.  Resume previous diet and medications today.  Return to your normal activities tomorrow.      YOU HAD AN ENDOSCOPIC PROCEDURE TODAY AT [facility]:   Refer to the procedure report that was given to you for any specific questions about what was found during the examination.  If the procedure report does not answer your questions, please call your gastroenterologist to clarify.  If you requested that your care partner not be given the details of your procedure findings, then the procedure report has been included in a sealed envelope for you to review at your convenience later.  YOU SHOULD EXPECT: Some feelings of bloating in the abdomen. Passage of more gas than usual.  Walking can help get rid of the air that was put into your GI tract during the procedure and reduce the bloating. If you had a lower endoscopy (such as a colonoscopy or flexible sigmoidoscopy) you may notice spotting of blood in your stool or on the toilet paper. If you underwent a bowel prep for your procedure, you may not have a normal bowel movement for a few days.  Please Note:  You might notice some irritation and congestion in your nose or some drainage.  This is from the oxygen used during your procedure.  There is no need for concern and it should clear up in a day or so.  SYMPTOMS TO REPORT IMMEDIATELY:   Following lower endoscopy (colonoscopy or flexible sigmoidoscopy):  Excessive amounts of blood in the stool  Significant tenderness or worsening of abdominal pains  Swelling of the abdomen that is new, acute  Fever of 100F or higher   For urgent or emergent issues, a gastroenterologist can be reached at any hour by calling [phone].   DIET:  We do recommend a small meal at first, but then you may proceed to  your regular diet.  Drink plenty of fluids but you should avoid alcoholic beverages for 24 hours.  ACTIVITY:  You should plan to take it easy for the rest of today and you should NOT DRIVE or use heavy machinery until tomorrow (because of the sedation medicines used during the test).    FOLLOW UP: Our staff will call the number listed on your records 48-72 hours following your procedure to check on you and address any questions or concerns that you may have regarding the information given to you following your procedure. If we do not reach you, we will leave a message.  We will attempt to reach you two times.  During this call, we will ask if you have developed any symptoms of COVID 19. If you develop any symptoms (ie: fever, flu-like symptoms, shortness of breath, cough etc.) before then, please call [phone].  If you test positive for Covid 19 in the 2 weeks post procedure, please call and report this information to [place].    If any biopsies were taken you will be contacted by phone or by letter within the next 1-3 weeks.  Please call us at [phone] if you have not heard about the biopsies in 3 weeks.    SIGNATURES/CONFIDENTIALITY: You and/or your care partner have signed paperwork which will be entered into your electronic medical record.  These signatures attest to the fact that that the information above on  your After Visit Summary has been reviewed and is understood.  Full responsibility of the confidentiality of this discharge information lies with you and/or your care-partner. 

## 2019-05-15 ENCOUNTER — Telehealth: Payer: Self-pay

## 2019-05-15 ENCOUNTER — Encounter: Payer: Self-pay | Admitting: Gastroenterology

## 2019-05-15 NOTE — Telephone Encounter (Signed)
Follow up call made to check on pt after procedure on 05/11/2019. Line was busy x2.

## 2019-05-15 NOTE — Telephone Encounter (Signed)
Follow call made x 2, no answer.

## 2019-08-18 ENCOUNTER — Emergency Department (HOSPITAL_COMMUNITY)
Admission: EM | Admit: 2019-08-18 | Discharge: 2019-08-18 | Disposition: A | Discharge status: Home / Self Care | Payer: 59 | Attending: Emergency Medicine | Admitting: Emergency Medicine

## 2019-08-18 ENCOUNTER — Emergency Department (HOSPITAL_COMMUNITY): Payer: 59

## 2019-08-18 ENCOUNTER — Encounter (HOSPITAL_COMMUNITY): Payer: Self-pay

## 2019-08-18 ENCOUNTER — Other Ambulatory Visit: Payer: Self-pay

## 2019-08-18 DIAGNOSIS — E119 Type 2 diabetes mellitus without complications: Secondary | ICD-10-CM | POA: Insufficient documentation

## 2019-08-18 DIAGNOSIS — K8020 Calculus of gallbladder without cholecystitis without obstruction: Secondary | ICD-10-CM | POA: Insufficient documentation

## 2019-08-18 DIAGNOSIS — I10 Essential (primary) hypertension: Secondary | ICD-10-CM | POA: Insufficient documentation

## 2019-08-18 DIAGNOSIS — Z794 Long term (current) use of insulin: Secondary | ICD-10-CM | POA: Diagnosis not present

## 2019-08-18 DIAGNOSIS — F17210 Nicotine dependence, cigarettes, uncomplicated: Secondary | ICD-10-CM | POA: Insufficient documentation

## 2019-08-18 DIAGNOSIS — R1010 Upper abdominal pain, unspecified: Secondary | ICD-10-CM | POA: Diagnosis not present

## 2019-08-18 DIAGNOSIS — Z79899 Other long term (current) drug therapy: Secondary | ICD-10-CM | POA: Diagnosis not present

## 2019-08-18 DIAGNOSIS — K920 Hematemesis: Secondary | ICD-10-CM | POA: Insufficient documentation

## 2019-08-18 DIAGNOSIS — R000 Tachycardia, unspecified: Secondary | ICD-10-CM | POA: Insufficient documentation

## 2019-08-18 LAB — COMPREHENSIVE METABOLIC PANEL
ALT: 12 U/L (ref 0–44)
AST: 16 U/L (ref 15–41)
Albumin: 3.4 g/dL — ABNORMAL LOW (ref 3.5–5.0)
Alkaline Phosphatase: 84 U/L (ref 38–126)
Anion gap: 8 (ref 5–15)
BUN: 17 mg/dL (ref 6–20)
CO2: 25 mmol/L (ref 22–32)
Calcium: 9.2 mg/dL (ref 8.9–10.3)
Chloride: 107 mmol/L (ref 98–111)
Creatinine, Ser: 1.65 mg/dL — ABNORMAL HIGH (ref 0.61–1.24)
GFR calc Af Amer: 57 mL/min — ABNORMAL LOW (ref >60)
GFR calc non Af Amer: 49 mL/min — ABNORMAL LOW (ref >60)
Glucose, Bld: 101 mg/dL — ABNORMAL HIGH (ref 70–99)
Potassium: 4.2 mmol/L (ref 3.5–5.1)
Sodium: 140 mmol/L (ref 135–145)
Total Bilirubin: 0.3 mg/dL (ref 0.3–1.2)
Total Protein: 7 g/dL (ref 6.5–8.1)

## 2019-08-18 LAB — CBC
HCT: 38 % — ABNORMAL LOW (ref 39.0–52.0)
Hemoglobin: 12.3 g/dL — ABNORMAL LOW (ref 13.0–17.0)
MCH: 29.9 pg (ref 26.0–34.0)
MCHC: 32.4 g/dL (ref 30.0–36.0)
MCV: 92.2 fL (ref 80.0–100.0)
Platelets: 179 10*3/uL (ref 150–400)
RBC: 4.12 MIL/uL — ABNORMAL LOW (ref 4.22–5.81)
RDW: 14.6 % (ref 11.5–15.5)
WBC: 13.3 10*3/uL — ABNORMAL HIGH (ref 4.0–10.5)
nRBC: 0 % (ref 0.0–0.2)

## 2019-08-18 LAB — URINALYSIS, ROUTINE W REFLEX MICROSCOPIC
Bilirubin Urine: NEGATIVE
Glucose, UA: NEGATIVE mg/dL
Ketones, ur: NEGATIVE mg/dL
Leukocytes,Ua: NEGATIVE
Nitrite: NEGATIVE
Protein, ur: 100 mg/dL — AB
Specific Gravity, Urine: 1.006 (ref 1.005–1.030)
pH: 6 (ref 5.0–8.0)

## 2019-08-18 LAB — POC OCCULT BLOOD, ED: Fecal Occult Bld: NEGATIVE

## 2019-08-18 LAB — LIPASE, BLOOD: Lipase: 56 U/L — ABNORMAL HIGH (ref 11–51)

## 2019-08-18 MED ORDER — IOHEXOL 300 MG/ML  SOLN
80.0000 mL | Freq: Once | INTRAMUSCULAR | Status: AC | PRN
  Administered 2019-08-18: 100 mL via INTRAVENOUS

## 2019-08-18 MED ORDER — SODIUM CHLORIDE 0.9 % IV SOLN: Freq: Once | INTRAVENOUS | Status: DC

## 2019-08-18 MED ORDER — AMLODIPINE BESYLATE 5 MG PO TABS
10.0000 mg | ORAL_TABLET | Freq: Once | ORAL | Status: AC
  Administered 2019-08-18: 10 mg via ORAL
  Filled 2019-08-18: qty 2

## 2019-08-18 MED ORDER — SODIUM CHLORIDE 0.9% FLUSH: 3.0000 mL | Freq: Once | INTRAVENOUS | Status: DC

## 2019-08-18 MED ORDER — SODIUM CHLORIDE 0.9 % IV BOLUS
1000.0000 mL | Freq: Once | INTRAVENOUS | Status: AC
  Administered 2019-08-18: 1000 mL via INTRAVENOUS

## 2019-08-18 MED ORDER — SODIUM CHLORIDE 0.9 % IV SOLN
8.0000 mg/h | INTRAVENOUS | Status: DC
  Filled 2019-08-18: qty 80

## 2019-08-18 MED ORDER — PANTOPRAZOLE SODIUM 40 MG IV SOLR
40.0000 mg | Freq: Once | INTRAVENOUS | Status: AC
  Administered 2019-08-18: 20:00:00 40 mg via INTRAVENOUS
  Filled 2019-08-18: qty 40

## 2019-08-18 MED ORDER — PANTOPRAZOLE SODIUM 40 MG IV SOLR: 40.0000 mg | Freq: Two times a day (BID) | INTRAVENOUS | Status: DC

## 2019-08-18 NOTE — ED Provider Notes (Signed)
[facility] EMERGENCY DEPARTMENT Provider Note   CSN: [id number] Arrival date & time: 08/18/19  1808     History   Chief Complaint Chief Complaint  Patient presents with  . Abdominal Pain  . Hematemesis    HPI [name] [name] is a 48 y.o. male.     [name] [name] is a 48 y.o. male with a history of hypertension, diabetes, necrotizing fasciitis, right BKA, and acute renal failure, who presents to the emergency department for abdominal pain, an episode of hematemesis.  Patient reports that last night he ate some stew for dinner, afterwards started having some abdominal discomfort and felt nauseated, he had 1 episode of emesis, a few minutes later felt like he needed to vomit again, this time vomited up about 1/4 cup of bright red blood.  After that he seemed to feel better went to sleep.  Woke up this morning with some continued abdominal pain, primarily in the right lower quadrant, but no continued vomiting and no additional episodes of hematemesis.  He reports normal bowel movements, no hematochezia or melena.  He denies any similar episodes of hematemesis in the past.  Denies any liver disease.  Does report he had blood in his stools once before several months ago, has seen with our GI in the past for colonoscopy.  No other episodes of bleeding, is not on blood thinners.  He denies any chest pain, shortness of breath, cough or hemoptysis. No syncope or lightheadedness.  No fevers.  No urinary symptoms, no blood in the urine.  Denies any other aggravating or alleviating factors.  Reports he went to his PCP today for evaluation of the symptoms, but was sent to the ED for further evaluation.     Past Medical History:  Diagnosis Date  . ARF (acute renal failure) ([place]) 09/2017  . Diabetes mellitus without complication ([place])   . Hypertension   . Necrotizing fasciitis ([place]) 10/13/2017  . Wound dehiscence 11/24/2017    Patient Active Problem List   Diagnosis Date  Noted  . 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 Non-pressure chronic ulcer of other part of left foot limited to breakdown of skin ([place]) 02/02/2018  . 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 History of right below knee amputation ([place]) 12/22/2017  . Fever   . Acute posthemorrhagic anemia   . Leukocytosis   . Falls 11/24/2017  . Benign essential HTN 11/24/2017  . Normocytic normochromic anemia 11/24/2017  . Sepsis ([place]) 10/11/2017  . Uncontrolled type 2 diabetes mellitus with hyperglycemia ([place]) 10/11/2017  . ARF (acute renal failure) ([place]) 10/11/2017  . Nausea & vomiting 10/11/2017    Past Surgical History:  Procedure Laterality Date  . AMPUTATION Right 10/13/2017   Procedure: RIGHT BELOW KNEE AMPUTATION;  Surgeon: [name], MD;  Location: [facility];  Service: Orthopedics;  Laterality: Right;  . AMPUTATION Right 11/24/2017   Procedure: AMPUTATION BELOW KNEE REVISION;  Surgeon: [name], MD;  Location: [facility];  Service: Orthopedics;  Laterality: Right;  . NO PAST SURGERIES          Home Medications    Prior to Admission medications   Medication Sig Start Date End Date Taking? Authorizing Provider  amitriptyline (ELAVIL) 100 MG tablet Take 200 mg by mouth at bedtime.  01/10/19  Yes [provider]  amLODipine (NORVASC) 10 MG tablet Take 10 mg by mouth every morning.    Yes [provider]  ARIPiprazole (ABILIFY) 20 MG tablet  Take 20 mg by mouth every morning. 08/06/19  Yes [provider]  atorvastatin (LIPITOR) 10 MG tablet Take 10 mg by mouth every morning. 06/07/19  Yes [provider]  baclofen (LIORESAL) 10 MG tablet 1/2 tab twice a day Patient taking differently: Take 20 mg by mouth 2 (two) times daily.  01/28/18  Yes Mack Hook, MD  Cholecalciferol-Vitamin C (VITAMIN D3-VITAMIN C PO) Take 1 tablet by mouth at bedtime.   Yes [provider]  docusate sodium (COLACE) 100 MG capsule Take 1 capsule  (100 mg total) by mouth 2 (two) times daily. Patient taking differently: Take 200 mg by mouth 2 (two) times daily.  11/30/17  Yes Buriev, Arie Sabina, MD  DULoxetine (CYMBALTA) 60 MG capsule Take 60 mg by mouth 2 (two) times daily.  01/04/19  Yes [provider]  fluticasone (FLONASE) 50 MCG/ACT nasal spray Place 2 sprays into both nostrils daily as needed for allergies or rhinitis.  01/09/19  Yes [provider]  gabapentin (NEURONTIN) 300 MG capsule 1 cap by mouth in morning and midday, 2 caps by mouth at bedtime Patient taking differently: Take 300-600 mg by mouth See admin instructions. Take one capsule (300 mg) by mouth in the morning and afternoon, take two capsules (600 mg) at bedtime 11/19/17  Yes Mack Hook, MD  glipiZIDE (GLUCOTROL XL) 2.5 MG 24 hr tablet Take 2.5 mg by mouth 2 (two) times daily with a meal. 07/10/19  Yes [provider]  lisinopril (ZESTRIL) 10 MG tablet Take 10 mg by mouth every morning. 06/02/19  Yes [provider]  metFORMIN (GLUCOPHAGE) 1000 MG tablet Take 1 tablet (1,000 mg total) by mouth 2 (two) times daily with a meal. 01/21/18  Yes Mack Hook, MD  Truddie Crumble ULTRA-THIN LANCETS MISC Check blood sugars before meals twice daily 11/19/17   Mack Hook, MD  Blood Glucose Monitoring Suppl (AGAMATRIX PRESTO) w/Device KIT Check sugars twice daily before meals 11/19/17   Mack Hook, MD  glipiZIDE (GLUCOTROL) 5 MG tablet 1/2 tab by mouth twice daily with meal Patient not taking: Reported on 08/18/2019 11/19/17   Mack Hook, MD  glucose blood (AGAMATRIX PRESTO TEST) test strip Check blood sugars twice daily before meals 02/02/18   Mack Hook, MD  insulin aspart (NOVOLOG) 100 UNIT/ML injection Inject 0-5 Units into the skin 3 (three) times daily with meals. Insulin sliding scale Patient not taking: Reported on 05/11/2019 11/30/17   Kinnie Feil, MD  lisinopril (PRINIVIL,ZESTRIL) 5 MG tablet Take 1  tablet (5 mg total) by mouth daily. Patient not taking: Reported on 08/18/2019 03/08/18   Mack Hook, MD  polyethylene glycol (MIRALAX / GLYCOLAX) 17 g packet Take 17 g by mouth daily. Patient not taking: Reported on 05/11/2019 04/28/19   Doran Stabler, MD    Family History Family History  Problem Relation Age of Onset  . Diabetes Mellitus II Mother   . Depression Mother   . Heart disease Father        CABG x 4.  04/2017  . Colon cancer Neg Hx   . Esophageal cancer Neg Hx   . Liver cancer Neg Hx   . Pancreatic cancer Neg Hx   . Rectal cancer Neg Hx   . Stomach cancer Neg Hx     Social History Social History   Tobacco Use  . Smoking status: Current Every Day Smoker    Packs/day: 0.25    Years: 35.00    Pack years: 8.75  Types: Cigarettes  . Smokeless tobacco: Never Used  . Tobacco comment: stopped, but back on 1 cigarette daily.  Substance Use Topics  . Alcohol use: No    Frequency: Never  . Drug use: No     Allergies   Adhesive [tape]   Review of Systems Review of Systems  Constitutional: Negative for chills and fever.  HENT: Negative.   Respiratory: Negative for cough and shortness of breath.   Cardiovascular: Negative for chest pain.  Gastrointestinal: Positive for abdominal pain. Negative for blood in stool, constipation, diarrhea, nausea and vomiting.  Genitourinary: Negative for dysuria and frequency.  Musculoskeletal: Negative for arthralgias and myalgias.  Skin: Negative for color change and rash.  Neurological: Negative for dizziness and light-headedness.     Physical Exam Updated Vital Signs BP (!) 152/88   Pulse (!) 118   Temp 98.6 F (37 C) (Oral)   Resp 18   SpO2 98%   Physical Exam Vitals signs and nursing note reviewed.  Constitutional:      General: He is not in acute distress.    Appearance: He is well-developed. He is not ill-appearing or diaphoretic.     Comments: Patient well-appearing and in no acute distress.   HENT:     Head: Normocephalic and atraumatic.     Mouth/Throat:     Mouth: Mucous membranes are moist.     Pharynx: Oropharynx is clear.  Eyes:     General:        Right eye: No discharge.        Left eye: No discharge.     Pupils: Pupils are equal, round, and reactive to light.  Neck:     Musculoskeletal: Neck supple.  Cardiovascular:     Rate and Rhythm: Regular rhythm. Tachycardia present.     Heart sounds: Normal heart sounds. No murmur. No friction rub. No gallop.      Comments: Tachycardia with HR ranging 110-120 regular rhythm Pulmonary:     Effort: Pulmonary effort is normal. No respiratory distress.     Breath sounds: Normal breath sounds. No wheezing or rales.     Comments: Respirations equal and unlabored, patient able to speak in full sentences, lungs clear to auscultation bilaterally Abdominal:     General: Bowel sounds are normal. There is no distension.     Palpations: Abdomen is soft. There is no mass.     Tenderness: There is abdominal tenderness in the right lower quadrant. There is no guarding.     Comments: Abdomen is soft, nondistended, bowel sounds are present throughout, abdomen with some mild generalized tenderness, but focal tenderness in the right lower quadrant without guarding, no CVA tenderness.  Genitourinary:    Comments: Chaperone presents during rectal exam Normal tone, brown stool present in the rectal vault, no bright red blood Musculoskeletal:        General: No deformity.  Skin:    General: Skin is warm and dry.     Capillary Refill: Capillary refill takes less than 2 seconds.  Neurological:     Mental Status: He is alert.     Coordination: Coordination normal.     Comments: Speech is clear, able to follow commands Moves extremities without ataxia, coordination intact  Psychiatric:        Mood and Affect: Mood normal.        Behavior: Behavior normal.      ED Treatments / Results  Labs (all labs ordered are listed, but only abnormal  results are  displayed) Labs Reviewed  LIPASE, BLOOD - Abnormal; Notable for the following components:      Result Value   Lipase 56 (*)    All other components within normal limits  COMPREHENSIVE METABOLIC PANEL - Abnormal; Notable for the following components:   Glucose, Bld 101 (*)    Creatinine, Ser 1.65 (*)    Albumin 3.4 (*)    GFR calc non Af Amer 49 (*)    GFR calc Af Amer 57 (*)    All other components within normal limits  CBC - Abnormal; Notable for the following components:   WBC 13.3 (*)    RBC 4.12 (*)    Hemoglobin 12.3 (*)    HCT 38.0 (*)    All other components within normal limits  URINALYSIS, ROUTINE W REFLEX MICROSCOPIC - Abnormal; Notable for the following components:   Color, Urine STRAW (*)    Hgb urine dipstick SMALL (*)    Protein, ur 100 (*)    Bacteria, UA RARE (*)    All other components within normal limits  POC OCCULT BLOOD, ED    EKG None  Radiology Ct Abdomen Pelvis W Contrast  Result Date: 08/18/2019 CLINICAL DATA:  Acute epigastric and right lower quadrant abdominal pain. EXAM: CT ABDOMEN AND PELVIS WITH CONTRAST TECHNIQUE: Multidetector CT imaging of the abdomen and pelvis was performed using the standard protocol following bolus administration of intravenous contrast. CONTRAST:  80 mL OMNIPAQUE IOHEXOL 300 MG/ML  SOLN COMPARISON:  None. FINDINGS: Lower chest: No acute abnormality. Hepatobiliary: Minimal cholelithiasis is noted. No biliary dilatation is noted. No definite hepatic abnormality is noted. Pancreas: Unremarkable. No pancreatic ductal dilatation or surrounding inflammatory changes. Spleen: Normal in size without focal abnormality. Adrenals/Urinary Tract: Adrenal glands are unremarkable. Kidneys are normal, without renal calculi, focal lesion, or hydronephrosis. Bladder is unremarkable. Stomach/Bowel: Stomach is within normal limits. Appendix appears normal. No evidence of bowel wall thickening, distention, or inflammatory changes.  Vascular/Lymphatic: No significant vascular findings are present. No enlarged abdominal or pelvic lymph nodes. Reproductive: Prostate is unremarkable. Other: No abdominal wall hernia or abnormality. No abdominopelvic ascites. Musculoskeletal: No acute or significant osseous findings. IMPRESSION: Minimal cholelithiasis. No other abnormality seen in the abdomen or pelvis. Electronically Signed   By: Marijo Conception M.D.   On: 08/18/2019 20:53    Procedures Procedures (including critical care time)  Medications Ordered in ED Medications  sodium chloride flush (NS) 0.9 % injection 3 mL (3 mLs Intravenous Not Given 08/18/19 1903)  sodium chloride 0.9 % bolus 1,000 mL (0 mLs Intravenous Stopped 08/18/19 2304)  pantoprazole (PROTONIX) injection 40 mg (40 mg Intravenous Given 08/18/19 2015)  iohexol (OMNIPAQUE) 300 MG/ML solution 80 mL (100 mLs Intravenous Contrast Given 08/18/19 2032)  amLODipine (NORVASC) tablet 10 mg (10 mg Oral Given 08/18/19 2258)     Initial Impression / Assessment and Plan / ED Course  I have reviewed the triage vital signs and the nursing notes.  Pertinent labs & imaging results that were available during my care of the patient were reviewed by me and considered in my medical decision making (see chart for details).  48 year old male presents to the ED for evaluation of abdominal pain and one episode of hematemesis.  On arrival patient is overall well-appearing.  He is tachycardic, mildly hypertensive but vitals otherwise normal.  On exam he has tenderness primarily in the right lower quadrant.  Rectal exam reveals brown stool, no bright red blood or melena, and Hemoccult negative.  After single  episode of hematemesis yesterday he has had no further vomiting, just continued abdominal pain.  Lab evaluation initiated from triage shows stable hemoglobin of 12.3, at baseline when compared to 1 year previous, mild leukocytosis of 13.3.  No significant electrolyte derangements.   Creatinine slightly elevated at 1.65 but no elevation in BUN to suggest severe upper GI bleed.  Urinalysis unremarkable.  Lipase slightly elevated at 56.  Given right lower quadrant abdominal tenderness will get CT abdomen pelvis.  Given that patient has had no additional episodes of hematemesis, question if patient could potentially had a Mallory-Weiss tear from previous episode of vomiting.  Although with tachycardia concern for potential continued blood loss.  Reassured that there is no blood in the stools.  CT abdomen pelvis shows minimal cholelithiasis but no other abnormality seen.  Patient does not have focal right upper quadrant tenderness and LFTs are normal.  I have low suspicion for acute cholecystitis.   Patient has reported improved pain, with no additional episodes of hematemesis.  After IV fluids heart rate initially improved to the 90s but now remains in the low 100s.  Given episode of hematemesis with tachycardia, recommended overnight observation to the patient, he reports that he feels fine he has not had any syncopal episodes and no further bleeding, and pain is improved and he would like to go home.  Discussed the potential risks with continued tachycardia of continued bleeding, could be life-threatening.  Patient expresses understanding of this, has full decision-making capacity also like to leave, patient will be signed out [place].  Encourage patient to return at any time for further evaluation and discussed specific return precautions that should warrant emergent reevaluation.  Patient expresses understanding.  Discharged home AMA.  Vitals:   08/18/19 2100 08/18/19 2115 08/18/19 2145 08/18/19 2300  BP: (!) 172/105 (!) 159/101 (!) 171/109 (!) 172/106  Pulse: 92 (!) 108 (!) 106 (!) 107  Resp:      Temp:      TempSrc:      SpO2: 98% 96% 97% 98%     Final Clinical Impressions(s) / ED Diagnoses   Final diagnoses:  Hematemesis with nausea  Pain of upper abdomen   Tachycardia  Hypertension, unspecified type  Gallstones    ED Discharge Orders    None       Jacqlyn Larsen, PA-C 08/20/19 1411    Deno Etienne, DO 08/20/19 1848

## 2019-08-18 NOTE — Discharge Instructions (Addendum)
Your work-up today was reassuring, your hemoglobin was normal and labs otherwise look good.  Your CT scan showed gallstones, out signs of acute cholecystitis or infection.  Your heart rate has been elevated after episode of bloody vomit, I am reassured that you have not had any additional episodes, and your hemoglobin looks good today but if you have any new or worsening symptoms please return to the emergency department immediately.  If you have additional episodes of bloody emesis, blood in your stool or dark black stool, new or worsening abdominal pain, feel lightheaded or like you may pass out, develop fever or any other new or concerning symptoms return for reevaluation.

## 2019-08-18 NOTE — ED Triage Notes (Signed)
Pt reports abd pain since last night and hematemesis x1 with bright red blood. Denies diarrhea.

## 2019-09-27 ENCOUNTER — Ambulatory Visit (INDEPENDENT_AMBULATORY_CARE_PROVIDER_SITE_OTHER): Payer: 59 | Admitting: Family

## 2019-09-27 ENCOUNTER — Other Ambulatory Visit: Payer: Self-pay

## 2019-09-27 ENCOUNTER — Encounter: Payer: Self-pay | Admitting: Family

## 2019-09-27 VITALS — Ht 77.0 in | Wt 245.0 lb

## 2019-09-27 DIAGNOSIS — Z89511 Acquired absence of right leg below knee: Secondary | ICD-10-CM | POA: Diagnosis not present

## 2019-09-27 DIAGNOSIS — L97211 Non-pressure chronic ulcer of right calf limited to breakdown of skin: Secondary | ICD-10-CM

## 2019-09-27 NOTE — Progress Notes (Signed)
Office Visit Note   Patient: [name] [name]           Date of Birth: 1971-07-05           MRN: [id number] Visit Date: 09/27/2019              Requested by: [name], NP [place],  [place] [address] PCP: [name], NP  Chief Complaint  Patient presents with  . Right Leg - Follow-up    11/2017 revision right BKA       HPI: The patient is a 48 year old gentleman seen today for evaluation of his right residual limb.  He is status post right below the knee amputation in 2019 he has had his current prosthetic for the last year and a half.  He developed a small wound over his shin this appears to be from and bearing this is sore there is some surrounding white tissue that he is concerned about he has not been doing any dressing changes.  He reports that his prosthesis moves and feels like it is going to fall off as he ambulates he is using 5 ply at this time.  As he ambulates down the hallway you can hear in visually see the socket moving up and down  Assessment & Plan: Visit Diagnoses:  1. History of right below knee amputation ([place])   2. Non-pressure chronic ulcer of right calf, limited to breakdown of skin ([place])     Plan: Have discussed that he should return to [facility] clinic possibly could have modifications to offload the anterior tibia he will call [facility] for an appointment did provide an order for new prosthesis set up as this ultimately may be necessary feel the ulcer should heal up uneventfully he will minimize his weightbearing until this is healed provided some thin dressings that he can wear in his prosthesis when necessary  Follow-Up Instructions: Return in about 3 weeks (around 10/18/2019), or if symptoms worsen or fail to improve.   Ortho Exam  Patient is alert, oriented, no adenopathy, well-dressed, normal affect, normal respiratory effort. On examination of the right residual limb this is well consolidated well-healed from a surgical aspect.   He does have a 8 mm diameter ulcer to distal residual limb from an bearing this superficial callus there is some surrounding maceration and 1 drop of clear drainage there is no surrounding erythema no odor no sign of infection  Imaging: No results found. No images are attached to the encounter.  Labs: Lab Results  Component Value Date   HGBA1C 5.7 (H) 01/21/2018   HGBA1C 11.1 (H) 10/11/2017   ESRSEDRATE 75 (H) 10/20/2017   CRP 1.9 (H) 10/20/2017   REPTSTATUS 12/02/2017 FINAL 11/27/2017   REPTSTATUS 12/03/2017 FINAL 11/27/2017   CULT NO GROWTH 5 DAYS 11/27/2017   CULT NO GROWTH 5 DAYS 11/27/2017     Lab Results  Component Value Date   ALBUMIN 3.4 (L) 08/18/2019   ALBUMIN 1.9 (L) 10/17/2017   ALBUMIN 1.9 (L) 10/13/2017    Lab Results  Component Value Date   MG 2.0 10/13/2017   No results found for: VD25OH  No results found for: PREALBUMIN CBC EXTENDED Latest Ref Rng & Units 08/18/2019 01/21/2018 11/29/2017  WBC 4.0 - 10.5 K/uL 13.3(H) 10.4 7.3  RBC 4.22 - 5.81 MIL/uL 4.12(L) 4.72 3.21(L)  HGB 13.0 - 17.0 g/dL 12.3(L) 12.8(L) 8.4(L)  HCT 39.0 - 52.0 % 38.0(L) 39.1 26.6(L)  PLT 150 - 400 K/uL 179 228 232  NEUTROABS 1.4 - 7.0 x10E3/uL - 6.7 -  LYMPHSABS 0.7 - 3.1 x10E3/uL - 2.7 -     Body mass index is 29.05 kg/m.  Orders:  No orders of the defined types were placed in this encounter.  No orders of the defined types were placed in this encounter.    Procedures: No procedures performed  Clinical Data: No additional findings.  ROS:  All other systems negative, except as noted in the HPI. Review of Systems  Constitutional: Negative for chills and fever.  Skin: Positive for color change and wound.    Objective: Vital Signs: Ht 6\' 5"  (1.956 m)   Wt 245 lb (111.1 kg)   BMI 29.05 kg/m   Specialty Comments:  No specialty comments available.  PMFS History: Patient Active Problem List   Diagnosis Date Noted  . 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 Non-pressure chronic ulcer of other part of left foot limited to breakdown of skin ([place]) 02/02/2018  . 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 History of right below knee amputation ([place]) 12/22/2017  . Fever   . Acute posthemorrhagic anemia   . Leukocytosis   . Falls 11/24/2017  . Benign essential HTN 11/24/2017  . Normocytic normochromic anemia 11/24/2017  . Sepsis ([place]) 10/11/2017  . Uncontrolled type 2 diabetes mellitus with hyperglycemia ([place]) 10/11/2017  . ARF (acute renal failure) ([place]) 10/11/2017  . Nausea & vomiting 10/11/2017   Past Medical History:  Diagnosis Date  . ARF (acute renal failure) ([place]) 09/2017  . Diabetes mellitus without complication ([place])   . Hypertension   . Necrotizing fasciitis ([place]) 10/13/2017  . Wound dehiscence 11/24/2017    Family History  Problem Relation Age of Onset  . Diabetes Mellitus II Mother   . Depression Mother   . Heart disease Father        CABG x 4.  04/2017  . Colon cancer Neg Hx   . Esophageal cancer Neg Hx   . Liver cancer Neg Hx   . Pancreatic cancer Neg Hx   . Rectal cancer Neg Hx   . Stomach cancer Neg Hx     Past Surgical History:  Procedure Laterality Date  . AMPUTATION Right 10/13/2017   Procedure: RIGHT BELOW KNEE AMPUTATION;  Surgeon: [name], MD;  Location: [facility];  Service: Orthopedics;  Laterality: Right;  . AMPUTATION Right 11/24/2017   Procedure: AMPUTATION BELOW KNEE REVISION;  Surgeon: [name], MD;  Location: [facility];  Service: Orthopedics;  Laterality: Right;  . NO PAST SURGERIES     Social History   Occupational History  . Occupation: unemployed  Tobacco Use  . Smoking status: Current Every Day Smoker    Packs/day: 0.25    Years: 35.00    Pack years: 8.75    Types: Cigarettes  . Smokeless tobacco: Never Used  . Tobacco comment: stopped, but back on 1 cigarette daily.  Substance and Sexual Activity  . Alcohol use: No    Frequency: Never  . Drug use: No  . Sexual activity:  Not on file

## 2019-11-24 HISTORY — PX: COLONOSCOPY: SHX174

## 2020-04-12 ENCOUNTER — Other Ambulatory Visit: Payer: Self-pay | Admitting: Nurse Practitioner

## 2020-04-12 ENCOUNTER — Ambulatory Visit
Admission: RE | Admit: 2020-04-12 | Discharge: 2020-04-12 | Disposition: A | Discharge status: Home / Self Care | Payer: Medicare Other | Source: Ambulatory Visit | Attending: Nurse Practitioner | Admitting: Nurse Practitioner

## 2020-04-12 DIAGNOSIS — R509 Fever, unspecified: Secondary | ICD-10-CM

## 2020-04-26 ENCOUNTER — Encounter: Payer: Self-pay | Admitting: Gastroenterology

## 2020-05-13 ENCOUNTER — Other Ambulatory Visit: Payer: Self-pay

## 2020-05-13 ENCOUNTER — Ambulatory Visit (AMBULATORY_SURGERY_CENTER): Payer: Self-pay

## 2020-05-13 VITALS — Ht 77.0 in | Wt 240.1 lb

## 2020-05-13 DIAGNOSIS — Z860100 Personal history of colon polyps, unspecified: Secondary | ICD-10-CM

## 2020-05-13 DIAGNOSIS — K5909 Other constipation: Secondary | ICD-10-CM

## 2020-05-13 HISTORY — DX: Other constipation: K59.09

## 2020-05-13 MED ORDER — NA SULFATE-K SULFATE-MG SULF 17.5-3.13-1.6 GM/177ML PO SOLN: 1.0000 | Freq: Once | ORAL | 0 refills | Status: AC

## 2020-05-13 NOTE — Progress Notes (Signed)
No egg or soy allergy known to patient  No issues with past sedation with any surgeries  or procedures, no intubation problems  No diet pills per patient No home 02 use per patient  No blood thinners per patient  Pt has issues with constipation , states bm q 2-3 days, 2 Day Prep ordered.  No A fib or A flutter  EMMI video sent to pt's e mail   Pt has been vaccinated for covid.  Due to the COVID-19 pandemic we are asking patients to follow these guidelines. Please only bring one care partner. Please be aware that your care partner may wait in the car in the parking lot or if they feel like they will be too hot to wait in the car, they may wait in the lobby on the 4th floor. All care partners are required to wear a mask the entire time (we do not have any that we can provide them), they need to practice social distancing, and we will do a Covid check for all patient's and care partners when you arrive. Also we will check their temperature and your temperature. If the care partner waits in their car they need to stay in the parking lot the entire time and we will call them on their cell phone when the patient is ready for discharge so they can bring the car to the front of the building. Also all patient's will need to wear a mask into building.

## 2020-05-24 ENCOUNTER — Other Ambulatory Visit: Payer: Self-pay

## 2020-05-24 ENCOUNTER — Ambulatory Visit (AMBULATORY_SURGERY_CENTER): Payer: Medicare Other | Admitting: Gastroenterology

## 2020-05-24 ENCOUNTER — Encounter: Payer: Self-pay | Admitting: Gastroenterology

## 2020-05-24 VITALS — BP 151/91 | HR 84 | Temp 96.6°F | Resp 12 | Ht 77.0 in | Wt 240.1 lb

## 2020-05-24 DIAGNOSIS — D124 Benign neoplasm of descending colon: Secondary | ICD-10-CM | POA: Diagnosis not present

## 2020-05-24 DIAGNOSIS — Z860100 Personal history of colon polyps, unspecified: Secondary | ICD-10-CM

## 2020-05-24 DIAGNOSIS — D128 Benign neoplasm of rectum: Secondary | ICD-10-CM | POA: Diagnosis not present

## 2020-05-24 DIAGNOSIS — D122 Benign neoplasm of ascending colon: Secondary | ICD-10-CM

## 2020-05-24 MED ORDER — SODIUM CHLORIDE 0.9 % IV SOLN: 500.0000 mL | Freq: Once | INTRAVENOUS | Status: DC

## 2020-05-24 MED ORDER — FLEET ENEMA 7-19 GM/118ML RE ENEM
1.0000 | ENEMA | Freq: Once | RECTAL | Status: AC
Start: 2020-05-24 — End: 2020-05-24
  Administered 2020-05-24: 1 via RECTAL

## 2020-05-24 NOTE — Progress Notes (Signed)
Called to room to assist during endoscopic procedure.  Patient ID and intended procedure confirmed with present staff. Received instructions for my participation in the procedure from the performing physician.  

## 2020-05-24 NOTE — Progress Notes (Signed)
PT taken to PACU. Monitors in place. VSS. Report given to RN. 

## 2020-05-24 NOTE — Progress Notes (Signed)
VS-VV  Pt's states no medical or surgical changes since previsit or office visit.  Pt reports dark stool with some small pieces still after completing a 2 day prep with Miralax, Dulcolax and Suprep. Dr. Loletha Carrow notified. Orders received to give an enema. Pt instructed on proper way to take the enema. Pt verbalized understanding. Results were reported to Dr. Loletha Carrow- green mushy small amount of stool. Dr. Loletha Carrow will still proceed with procedure today. Pt made aware of plan and is agreeable.

## 2020-05-24 NOTE — Op Note (Signed)
[facility] Patient Name: [name] [name] Procedure Date: 05/24/2020 11:10 AM MRN: [id number] Endoscopist: Mallie Mussel L. Loletha Carrow , MD Age: 49 Referring MD:  Date of Birth: 01-Dec-1970 Gender: Male Account #: [id number] Procedure:                Colonoscopy Indications:              Surveillance: History of adenomatous polyps,                            inadequate prep on last exam (<53yr) - multiple TA                            04/2019 on exam done for heme positive stool - poor                            prep Medicines:                Monitored Anesthesia Care Procedure:                Pre-Anesthesia Assessment:                           - Prior to the procedure, a History and Physical                            was performed, and patient medications and                            allergies were reviewed. The patient's tolerance of                            previous anesthesia was also reviewed. The risks                            and benefits of the procedure and the sedation                            options and risks were discussed with the patient.                            All questions were answered, and informed consent                            was obtained. Prior Anticoagulants: The patient has                            taken no previous anticoagulant or antiplatelet                            agents. ASA Grade Assessment: III - A patient with                            severe systemic disease. After reviewing the risks  and benefits, the patient was deemed in                            satisfactory condition to undergo the procedure.                           After obtaining informed consent, the colonoscope                            was passed under direct vision. Throughout the                            procedure, the patient's blood pressure, pulse, and                            oxygen saturations were monitored continuously. The                             Colonoscope was introduced through the anus and                            advanced to the the cecum, identified by                            appendiceal orifice and ileocecal valve. The                            colonoscopy was somewhat difficult due to a                            redundant colon and significant looping. Successful                            completion of the procedure was aided by using                            manual pressure. The patient tolerated the                            procedure well. The quality of the bowel                            preparation was initially fair, then improved to                            good with lavage. The ileocecal valve, appendiceal                            orifice, and rectum were photographed. The bowel                            preparation used was 2 day Suprep/Miralax. Scope In: 11:18:29 AM Scope Out: 11:47:13 AM Scope Withdrawal Time: 0 hours 20 minutes 53 seconds  Total Procedure Duration:  0 hours 28 minutes 44 seconds  Findings:                 The perianal and digital rectal examinations were                            normal.                           Three sessile polyps were found in the rectum,                            transverse colon and ascending colon. The polyps                            were diminutive in size. These polyps were removed                            with a cold snare. Resection and retrieval were                            complete.                           Multiple diverticula were found in the left colon.                           The colon (entire examined portion) was redundant.                           The exam was otherwise without abnormality on                            direct and retroflexion views. Complications:            No immediate complications. Estimated Blood Loss:     Estimated blood loss was minimal. Impression:               - Three diminutive  polyps in the rectum, in the                            transverse colon and in the ascending colon,                            removed with a cold snare. Resected and retrieved.                           - Diverticulosis in the left colon.                           - Redundant colon.                           - The examination was otherwise normal on direct                            and  retroflexion views. Recommendation:           - Patient has a contact number available for                            emergencies. The signs and symptoms of potential                            delayed complications were discussed with the                            patient. Return to normal activities tomorrow.                            Written discharge instructions were provided to the                            patient.                           - Resume previous diet.                           - Continue present medications.                           - Await pathology results.                           - Repeat colonoscopy in 3 years for surveillance.                            2-day prep with Suprep and Moviprep and afternoon                            procedure slot for next exam. Mallie Mussel L. Loletha Carrow, MD 05/24/2020 11:58:28 AM This report has been signed electronically.

## 2020-05-24 NOTE — Patient Instructions (Signed)
YOU HAD AN ENDOSCOPIC PROCEDURE TODAY AT [facility]:   Refer to the procedure report that was given to you for any specific questions about what was found during the examination.  If the procedure report does not answer your questions, please call your gastroenterologist to clarify.  If you requested that your care partner not be given the details of your procedure findings, then the procedure report has been included in a sealed envelope for you to review at your convenience later.  YOU SHOULD EXPECT: Some feelings of bloating in the abdomen. Passage of more gas than usual.  Walking can help get rid of the air that was put into your GI tract during the procedure and reduce the bloating. If you had a lower endoscopy (such as a colonoscopy or flexible sigmoidoscopy) you may notice spotting of blood in your stool or on the toilet paper. If you underwent a bowel prep for your procedure, you may not have a normal bowel movement for a few days.  Please Note:  You might notice some irritation and congestion in your nose or some drainage.  This is from the oxygen used during your procedure.  There is no need for concern and it should clear up in a day or so.  SYMPTOMS TO REPORT IMMEDIATELY:   Following lower endoscopy (colonoscopy or flexible sigmoidoscopy):  Excessive amounts of blood in the stool  Significant tenderness or worsening of abdominal pains  Swelling of the abdomen that is new, acute  Fever of 100F or higher   For urgent or emergent issues, a gastroenterologist can be reached at any hour by calling [phone]. Do not use MyChart messaging for urgent concerns.    DIET:  We do recommend a small meal at first, but then you may proceed to your regular diet.  Drink plenty of fluids but you should avoid alcoholic beverages for 24 hours.  MEDICATIONS: Continue present medications.  Please see handouts given to you by your recovery nurse.  ACTIVITY:  You should plan to  take it easy for the rest of today and you should NOT DRIVE or use heavy machinery until tomorrow (because of the sedation medicines used during the test).    FOLLOW UP: Our staff will call the number listed on your records 48-72 hours following your procedure to check on you and address any questions or concerns that you may have regarding the information given to you following your procedure. If we do not reach you, we will leave a message.  We will attempt to reach you two times.  During this call, we will ask if you have developed any symptoms of COVID 19. If you develop any symptoms (ie: fever, flu-like symptoms, shortness of breath, cough etc.) before then, please call [phone].  If you test positive for Covid 19 in the 2 weeks post procedure, please call and report this information to [place].    If any biopsies were taken you will be contacted by phone or by letter within the next 1-3 weeks.  Please call us at [phone] if you have not heard about the biopsies in 3 weeks.    SIGNATURES/CONFIDENTIALITY: You and/or your care partner have signed paperwork which will be entered into your electronic medical record.  These signatures attest to the fact that that the information above on your After Visit Summary has been reviewed and is understood.  Full responsibility of the confidentiality of this discharge information lies with you and/or your care-partner.

## 2020-05-29 ENCOUNTER — Telehealth: Payer: Self-pay

## 2020-05-29 NOTE — Telephone Encounter (Signed)
  Follow up Call-  Call back number 05/24/2020 05/11/2019  Post procedure Call Back phone  # [phone]  Permission to leave phone message Yes Yes  Some recent data might be hidden     Patient questions:  Do you have a fever, pain , or abdominal swelling? No. Pain Score  0 *  Have you tolerated food without any problems? Yes.    Have you been able to return to your normal activities? Yes.    Do you have any questions about your discharge instructions: Diet   No. Medications  No. Follow up visit  No.  Do you have questions or concerns about your Care? No.  Actions: * If pain score is 4 or above: 1. No action needed, pain <4.Have you developed a fever since your procedure? no  2.   Have you had an respiratory symptoms (SOB or cough) since your procedure? no  3.   Have you tested positive for COVID 19 since your procedure no  4.   Have you had any family members/close contacts diagnosed with the COVID 19 since your procedure?  no   If yes to any of these questions please route to Joylene John, RN and Erenest Rasher, RN

## 2020-05-30 ENCOUNTER — Encounter: Payer: Self-pay | Admitting: Gastroenterology

## 2020-07-04 ENCOUNTER — Ambulatory Visit
Admission: EM | Admit: 2020-07-04 | Discharge: 2020-07-04 | Disposition: A | Discharge status: Home / Self Care | Payer: Medicare Other | Attending: Physician Assistant | Admitting: Physician Assistant

## 2020-07-04 ENCOUNTER — Other Ambulatory Visit: Payer: Self-pay

## 2020-07-04 DIAGNOSIS — R112 Nausea with vomiting, unspecified: Secondary | ICD-10-CM

## 2020-07-04 MED ORDER — METOCLOPRAMIDE HCL 5 MG PO TABS
5.0000 mg | ORAL_TABLET | Freq: Three times a day (TID) | ORAL | 0 refills | Status: DC
Start: 2020-07-04 — End: 2021-04-29

## 2020-07-04 MED ORDER — FAMOTIDINE 20 MG PO TABS
20.0000 mg | ORAL_TABLET | Freq: Two times a day (BID) | ORAL | 0 refills | Status: DC
Start: 2020-07-04 — End: 2021-04-29

## 2020-07-04 NOTE — ED Triage Notes (Signed)
Pt c/o vomiting at least once a day for the past month. States saw PCP last month and given Zofran. States no relief with Zofran. States starts burping right before he vomits.

## 2020-07-04 NOTE — ED Provider Notes (Signed)
[facility]    CSN: [id number] Arrival date & time: 07/04/20  1213      History   Chief Complaint Chief Complaint  Patient presents with  . Emesis    HPI [name] [name] is a 49 y.o. male.   49 year old male with history of diabetes, HLD, HTN comes in for 1 month history of nausea/vomiting.  States he can vomit at least once a day.  Can tolerate oral intake.  States Posta can make symptoms worse.  Denies hematemesis, abdominal pain, diarrhea/constipation.  Does have some throat irritation.  Also with burning sensation to the epigastric area.  Denies trouble swallowing, globus sensation.  Endorses mild early satiety.  States saw PCP when symptoms first started, was given Zofran with no relief.     Past Medical History:  Diagnosis Date  . Allergy    seasonal and environmental  . ARF (acute renal failure) ([place]) 09/2017  . Chronic constipation 05/13/2020  . Depression   . Diabetes mellitus without complication ([place]) 2376  . Hyperlipidemia   . Hypertension   . Necrotizing fasciitis ([place]) 10/13/2017  . Neuromuscular disorder ([facility])    neuropathy  . Wound dehiscence 11/24/2017    Patient Active Problem List   Diagnosis Date Noted  . 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 Non-pressure chronic ulcer of other part of left foot limited to breakdown of skin ([place]) 02/02/2018  . 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 History of right below knee amputation ([place]) 12/22/2017  . Fever   . Acute posthemorrhagic anemia   . Leukocytosis   . Falls 11/24/2017  . Benign essential HTN 11/24/2017  . Normocytic normochromic anemia 11/24/2017  . Sepsis ([place]) 10/11/2017  . Uncontrolled type 2 diabetes mellitus with hyperglycemia ([place]) 10/11/2017  . ARF (acute renal failure) ([place]) 10/11/2017  . Nausea & vomiting 10/11/2017    Past Surgical History:  Procedure Laterality Date  . AMPUTATION Right 10/13/2017   Procedure: RIGHT BELOW KNEE AMPUTATION;   Surgeon: [name], MD;  Location: [facility];  Service: Orthopedics;  Laterality: Right;  . AMPUTATION Right 11/24/2017   Procedure: AMPUTATION BELOW KNEE REVISION;  Surgeon: [name], MD;  Location: [facility];  Service: Orthopedics;  Laterality: Right;  . COLONOSCOPY  05/11/2019  . NO PAST SURGERIES    . POLYPECTOMY    . WISDOM TOOTH EXTRACTION         Home Medications    Prior to Admission medications   Medication Sig Start Date End Date Taking? Authorizing Provider  Truddie Crumble ULTRA-THIN LANCETS MISC Check blood sugars before meals twice daily 11/19/17   Mack Hook, MD  amitriptyline (ELAVIL) 100 MG tablet Take 200 mg by mouth at bedtime.  01/10/19   [provider]  amLODipine (NORVASC) 10 MG tablet Take 10 mg by mouth every morning.     [provider]  ARIPiprazole (ABILIFY) 20 MG tablet Take 20 mg by mouth every morning. 08/06/19   [provider]  atorvastatin (LIPITOR) 10 MG tablet Take 10 mg by mouth every morning. 06/07/19   [provider]  baclofen (LIORESAL) 10 MG tablet 1/2 tab twice a day Patient taking differently: Take 20 mg by mouth 2 (two) times daily.  01/28/18   Mack Hook, MD  bisacodyl (DULCOLAX) 5 MG EC tablet Take 5 mg by mouth daily as needed for moderate constipation.    [provider]  Blood Glucose Monitoring Suppl (AGAMATRIX PRESTO) w/Device KIT Check sugars twice daily before  meals 11/19/17   Mack Hook, MD  Cholecalciferol-Vitamin C (VITAMIN D3-VITAMIN C PO) Take 1 tablet by mouth at bedtime.    [provider]  desvenlafaxine (PRISTIQ) 100 MG 24 hr tablet Take 100 mg by mouth daily. 02/20/20   [provider]  docusate sodium (COLACE) 100 MG capsule Take 1 capsule (100 mg total) by mouth 2 (two) times daily. Patient taking differently: Take 200 mg by mouth daily as needed.  11/30/17   Kinnie Feil, MD  DULoxetine (CYMBALTA) 60 MG capsule Take 60 mg by mouth 2 (two)  times daily.  01/04/19   [provider]  famotidine (PEPCID) 20 MG tablet Take 1 tablet (20 mg total) by mouth 2 (two) times daily. 07/04/20   Tasia Catchings, Chellie Vanlue V, PA-C  FARXIGA 5 MG TABS tablet Take 5 mg by mouth daily. 04/08/20   [provider]  fluticasone (FLONASE) 50 MCG/ACT nasal spray Place 2 sprays into both nostrils daily as needed for allergies or rhinitis.  01/09/19   [provider]  gabapentin (NEURONTIN) 300 MG capsule 1 cap by mouth in morning and midday, 2 caps by mouth at bedtime Patient taking differently: Take 300-600 mg by mouth See admin instructions. Take one capsule (300 mg) by mouth in the morning and afternoon, take two capsules (600 mg) at bedtime 11/19/17   Mack Hook, MD  glipiZIDE (GLUCOTROL) 5 MG tablet 1/2 tab by mouth twice daily with meal Patient taking differently: 5 mg. Pt states change to 19m tid with meals 11/19/17   MMack Hook MD  glucose blood (AGAMATRIX PRESTO TEST) test strip Check blood sugars twice daily before meals 02/02/18   MMack Hook MD  lisinopril (ZESTRIL) 20 MG tablet Take 20 mg by mouth 2 (two) times daily. 02/29/20   [provider]  metFORMIN (GLUCOPHAGE) 1000 MG tablet Take 1 tablet (1,000 mg total) by mouth 2 (two) times daily with a meal. 01/21/18   MMack Hook MD  metoCLOPramide (REGLAN) 5 MG tablet Take 1 tablet (5 mg total) by mouth 3 (three) times daily before meals for 10 days. 07/04/20 07/14/20  YOk Edwards PA-C  olmesartan-hydrochlorothiazide (BENICAR HCT) 40-12.5 MG tablet Take 1 tablet by mouth daily. 03/21/20   [provider]  ondansetron (ZOFRAN) 8 MG tablet Take 8 mg by mouth every 8 (eight) hours as needed. 12/01/19   [provider]  polyethylene glycol (MIRALAX / GLYCOLAX) 17 g packet Take 17 g by mouth daily. 04/28/19   DDoran Stabler MD    Family History Family History  Problem Relation Age of Onset  . Diabetes Mellitus II Mother   . Depression Mother    . Colon polyps Mother   . Heart disease Father        CABG x 4.  04/2017  . Colon cancer Neg Hx   . Esophageal cancer Neg Hx   . Liver cancer Neg Hx   . Pancreatic cancer Neg Hx   . Rectal cancer Neg Hx   . Stomach cancer Neg Hx     Social History Social History   Tobacco Use  . Smoking status: Current Every Day Smoker    Packs/day: 0.25    Years: 35.00    Pack years: 8.75    Types: Cigarettes  . Smokeless tobacco: Never Used  . Tobacco comment: stopped, but back on 1 cigarette daily.  Vaping Use  . Vaping Use: Never used  Substance Use Topics  . Alcohol use: No  . Drug  use: No     Allergies   Adhesive [tape]   Review of Systems Review of Systems  Reason unable to perform ROS: See HPI as above.     Physical Exam Triage Vital Signs ED Triage Vitals  Enc Vitals Group     BP 07/04/20 1327 126/81     Pulse Rate 07/04/20 1327 (!) 101     Resp 07/04/20 1327 18     Temp 07/04/20 1327 99.7 F (37.6 C)     Temp Source 07/04/20 1327 Oral     SpO2 07/04/20 1327 96 %     Weight --      Height --      Head Circumference --      Peak Flow --      Pain Score 07/04/20 1328 0     Pain Loc --      Pain Edu? --      Excl. in [place]? --    No data found.  Updated Vital Signs BP 126/81 (BP Location: Left Arm)   Pulse (!) 101   Temp 99.7 F (37.6 C) (Oral)   Resp 18   SpO2 96%   Physical Exam Constitutional:      General: He is not in acute distress.    Appearance: Normal appearance. He is not ill-appearing, toxic-appearing or diaphoretic.  HENT:     Head: Normocephalic and atraumatic.  Cardiovascular:     Rate and Rhythm: Normal rate and regular rhythm.  Pulmonary:     Effort: Pulmonary effort is normal. No respiratory distress.     Comments: LCTAB Abdominal:     General: Bowel sounds are normal.     Palpations: Abdomen is soft.     Tenderness: There is no abdominal tenderness. There is no right CVA tenderness, left CVA tenderness, guarding or rebound.    Musculoskeletal:     Cervical back: Normal range of motion and neck supple.  Skin:    General: Skin is warm and dry.  Neurological:     Mental Status: He is alert and oriented to person, place, and time.      UC Treatments / Results  Labs (all labs ordered are listed, but only abnormal results are displayed) Labs Reviewed - No data to display  EKG   Radiology No results found.  Procedures Procedures (including critical care time)  Medications Ordered in UC Medications - No data to display  Initial Impression / Assessment and Plan / UC Course  I have reviewed the triage vital signs and the nursing notes.  Pertinent labs & imaging results that were available during my care of the patient were reviewed by me and considered in my medical decision making (see chart for details).    Will try Reglan for nausea/vomiting related to possible gastroparesis.  Pepcid for possible acid reflux.  Return precautions given.  Otherwise patient to follow-up with PCP/GI for further evaluation if symptoms not improving.  Final Clinical Impressions(s) / UC Diagnoses   Final diagnoses:  Intractable vomiting with nausea, unspecified vomiting type   ED Prescriptions    Medication Sig Dispense Auth. Provider   metoCLOPramide (REGLAN) 5 MG tablet Take 1 tablet (5 mg total) by mouth 3 (three) times daily before meals for 10 days. 30 tablet Beaumont Austad V, PA-C   famotidine (PEPCID) 20 MG tablet Take 1 tablet (20 mg total) by mouth 2 (two) times daily. 20 tablet Ok Edwards, PA-C     PDMP not reviewed this encounter.  Ok Edwards, PA-C 07/04/20 1419

## 2020-07-04 NOTE — Discharge Instructions (Signed)
No alarming signs on exam.  Can try Reglan for nausea/vomiting.  Pepcid for possible acid reflux.  Follow-up with PCP/GI doctor for further evaluation and management needed.  If significant abdominal pain, fever, weakness, dizziness, high blood sugar, go to the emergency department for further evaluation.

## 2020-08-16 ENCOUNTER — Encounter (HOSPITAL_BASED_OUTPATIENT_CLINIC_OR_DEPARTMENT_OTHER): Payer: Medicare Other | Attending: Internal Medicine | Admitting: Internal Medicine

## 2020-08-16 ENCOUNTER — Other Ambulatory Visit: Payer: Self-pay

## 2020-08-16 DIAGNOSIS — Z8249 Family history of ischemic heart disease and other diseases of the circulatory system: Secondary | ICD-10-CM | POA: Diagnosis not present

## 2020-08-16 DIAGNOSIS — L97522 Non-pressure chronic ulcer of other part of left foot with fat layer exposed: Secondary | ICD-10-CM | POA: Insufficient documentation

## 2020-08-16 DIAGNOSIS — F17200 Nicotine dependence, unspecified, uncomplicated: Secondary | ICD-10-CM | POA: Insufficient documentation

## 2020-08-16 DIAGNOSIS — Z89511 Acquired absence of right leg below knee: Secondary | ICD-10-CM | POA: Insufficient documentation

## 2020-08-16 DIAGNOSIS — E11621 Type 2 diabetes mellitus with foot ulcer: Secondary | ICD-10-CM | POA: Diagnosis not present

## 2020-08-16 DIAGNOSIS — Z833 Family history of diabetes mellitus: Secondary | ICD-10-CM | POA: Diagnosis not present

## 2020-08-16 DIAGNOSIS — I10 Essential (primary) hypertension: Secondary | ICD-10-CM | POA: Insufficient documentation

## 2020-08-16 DIAGNOSIS — E1142 Type 2 diabetes mellitus with diabetic polyneuropathy: Secondary | ICD-10-CM | POA: Diagnosis not present

## 2020-08-16 DIAGNOSIS — E1151 Type 2 diabetes mellitus with diabetic peripheral angiopathy without gangrene: Secondary | ICD-10-CM | POA: Diagnosis not present

## 2020-08-16 NOTE — Progress Notes (Signed)
[name], [name] ([id number]) Visit Report for 08/16/2020 Abuse/Suicide Risk Screen Details Patient Name: Date of Service: [name] [name] 08/16/2020 2:45 PM Medical Record Number: [id number] Patient Account Number: [id number] Date of Birth/Sex: Treating RN: 07/05/1971 (49 y.o. [name] [name]) [name] Primary Care Romulus Hanrahan: [name] Other Clinician: Referring Piercen Covino: Treating Arlicia Paquette/Extender: Aurora Mask in Treatment: 0 Abuse/Suicide Risk Screen Items Answer ABUSE RISK SCREEN: Has anyone close to you tried to hurt or harm you recentlyo No Do you feel uncomfortable with anyone in your familyo No Has anyone forced you do things that you didnt want to doo No Electronic Signature(s) Signed: 08/16/2020 4:51:09 PM By: [name] RN Entered By: [name] on 08/16/2020 15:43:42 -------------------------------------------------------------------------------- Activities of Daily Living Details Patient Name: Date of Service: [name] [name] 08/16/2020 2:45 PM Medical Record Number: [id number] Patient Account Number: [id number] Date of Birth/Sex: Treating RN: September 18, 1971 (49 y.o. [name] [name]) [name] Primary Care Zilda No: [name] Other Clinician: Referring Jazmen Lindenbaum: Treating Cloud Graham/Extender: Aurora Mask in Treatment: 0 Activities of Daily Living Items Answer Activities of Daily Living (Please select one for each item) Drive Automobile Completely Able T Medications ake Completely Able Use T elephone Completely Able Care for Appearance Completely Able Use T oilet Completely Able Bath / Shower Completely Able Dress Self Completely Able Feed Self Completely Able Walk Completely Able Get In / Out Bed Completely Able Housework Completely Able Prepare Meals Completely [place] for Self Completely Able Electronic Signature(s) Signed: 08/16/2020 4:51:09 PM By: [name]  RN Entered By: [name] on 08/16/2020 15:44:17 -------------------------------------------------------------------------------- Education Screening Details Patient Name: Date of Service: [name], [name] 08/16/2020 2:45 PM Medical Record Number: [id number] Patient Account Number: [id number] Date of Birth/Sex: Treating RN: 06-18-1971 (49 y.o. [name] [name]) [name] Primary Care Wisdom Seybold: [name] Other Clinician: Referring Travares Nelles: Treating Kennesha Brewbaker/Extender: Aurora Mask in Treatment: 0 Primary Learner Assessed: Patient Learning Preferences/Education Level/Primary Language Learning Preference: Explanation Highest Education Level: High School Preferred Language: English Cognitive Barrier Language Barrier: No Translator Needed: No Memory Deficit: No Emotional Barrier: No Cultural/Religious Beliefs Affecting Medical Care: No Physical Barrier Impaired Vision: Yes Glasses Impaired Hearing: No Decreased Hand dexterity: No Knowledge/Comprehension Knowledge Level: Medium Comprehension Level: High Ability to understand written instructions: High Ability to understand verbal instructions: High Motivation Anxiety Level: Anxious Cooperation: Cooperative Education Importance: Acknowledges Need Interest in Health Problems: Asks Questions Perception: Coherent Willingness to Engage in Self-Management Medium Activities: Readiness to Engage in Self-Management Medium Activities: Electronic Signature(s) Signed: 08/16/2020 4:51:09 PM By: [name] RN Entered By: [name] on 08/16/2020 15:45:00 -------------------------------------------------------------------------------- Fall Risk Assessment Details Patient Name: Date of Service: [name], [name] 08/16/2020 2:45 PM Medical Record Number: [id number] Patient Account Number: [id number] Date of Birth/Sex: Treating RN: January 22, 1971 (49 y.o. [name] [name]) [name] Primary Care Laquanda Bick: [name] Other Clinician: Referring Jarnell Cordaro: Treating Elga Santy/Extender: Aurora Mask in Treatment: 0 Fall Risk Assessment Items Have you had 2 or more falls in the last 12 monthso 0 No Have you had any fall that resulted in injury in the last 12 monthso 0 No FALLS RISK SCREEN History of falling - immediate or within 3 months 0 No Secondary diagnosis (Do you have 2 or more medical diagnoseso) 0 No Ambulatory aid None/bed rest/wheelchair/nurse 0 No Crutches/cane/walker 0 No Furniture 0 No Intravenous therapy Access/Saline/Heparin Lock 0 No Gait/Transferring Normal/ bed rest/ wheelchair 0 No Weak (short steps with or  without shuffle, stooped but able to lift head while walking, may seek 0 No support from furniture) Impaired (short steps with shuffle, may have difficulty arising from chair, head down, impaired 0 No balance) Mental Status Oriented to own ability 0 No Electronic Signature(s) Signed: 08/16/2020 4:51:09 PM By: [name] RN Entered By: [name] on 08/16/2020 15:45:07 -------------------------------------------------------------------------------- Foot Assessment Details Patient Name: Date of Service: [name], [name] 08/16/2020 2:45 PM Medical Record Number: [id number] Patient Account Number: [id number] Date of Birth/Sex: Treating RN: 12/16/1970 (49 y.o. [name] [name]) [name] Primary Care [name] [name]: [name] Other Clinician: Referring Kellianne Ek: Treating Paxten Appelt/Extender: Aurora Mask in Treatment: 0 Foot Assessment Items Site Locations + = Sensation present, - = Sensation absent, C = Callus, U = Ulcer R = Redness, W = Warmth, M = Maceration, PU = Pre-ulcerative lesion F = Fissure, S = Swelling, D = Dryness Assessment Right: Left: Other Deformity: No No Prior Foot Ulcer: No No Prior Amputation: Yes No Charcot Joint: No No Ambulatory Status: Ambulatory Without Help Gait: Steady Electronic  Signature(s) Signed: 08/16/2020 4:51:09 PM By: [name] RN Entered By: [name] on 08/16/2020 15:51:17 -------------------------------------------------------------------------------- Nutrition Risk Screening Details Patient Name: Date of Service: [name] [name] 08/16/2020 2:45 PM Medical Record Number: [id number] Patient Account Number: [id number] Date of Birth/Sex: Treating RN: [birth year] (49 y.o. [name] [name]) [name] Primary Care Jhett Fretwell: [name] Other Clinician: Referring Donabelle Molden: Treating Cristina Mattern/Extender: Aurora Mask in Treatment: 0 Height (in): 77 Weight (lbs): 232 Body Mass Index (BMI): 27.5 Nutrition Risk Screening Items Score Screening NUTRITION RISK SCREEN: I have an illness or condition that made me change the kind and/or amount of food I eat 0 No I eat fewer than two meals per day 0 No I eat few fruits and vegetables, or milk products 0 No I have three or more drinks of beer, liquor or wine almost every day 0 No I have tooth or mouth problems that make it hard for me to eat 0 No I don't always have enough money to buy the food I need 0 No I eat alone most of the time 0 No I take three or more different prescribed or over-the-counter drugs a day 1 Yes Without wanting to, I have lost or gained 10 pounds in the last six months 0 No I am not always physically able to shop, cook and/or feed myself 0 No Nutrition Protocols Good Risk Protocol 0 No interventions needed Moderate Risk Protocol High Risk Proctocol Risk Level: Good Risk Score: 1 Electronic Signature(s) Signed: 08/16/2020 4:51:09 PM By: [name] RN Entered By: [name] on 08/16/2020 15:45:26

## 2020-08-18 NOTE — Progress Notes (Signed)
BURNIS, HALLING ([id number]) Visit Report for 08/16/2020 Chief Complaint Document Details Patient Name: Date of Service: [name] [name] 08/16/2020 2:45 PM Medical Record Number: [id number] Patient Account Number: [id number] Date of Birth/Sex: Treating RN: 1971/05/08 (49 y.o. [name] [name] Primary Care Provider: Ferd [name] Other Clinician: Referring Provider: Treating Provider/Extender: [name] [name] in Treatment: 0 Information Obtained from: Patient Chief Complaint 04/21/18; patient is here for review of a wound on his plantar left first metatarsal head 08/16/2020; patient is here for a review of the wound on his plantar left first metatarsal head Electronic Signature(s) Signed: 08/18/2020 6:30:39 AM By: [name] Ham MD Entered By: [name] [name] on 08/16/2020 16:47:55 -------------------------------------------------------------------------------- Debridement Details Patient Name: Date of Service: [name] [name], [name] 08/16/2020 2:45 PM Medical Record Number: [id number] Patient Account Number: [id number] Date of Birth/Sex: Treating RN: 1971-01-29 (49 y.o. [name] [name] Primary Care Provider: Ferd [name] Other Clinician: Referring Provider: Treating Provider/Extender: [name] [name] in Treatment: 0 Debridement Performed for Assessment: Wound #2 Left Metatarsal head first Performed By: Physician [name] [name]., MD Debridement Type: Debridement Severity of Tissue Pre Debridement: Limited to breakdown of skin Level of Consciousness (Pre-procedure): Awake and Alert Pre-procedure Verification/Time Out Yes - 16:25 Taken: Start Time: 16:25 Pain Control: Other : benzocaine, 20% T Area Debrided (L x W): otal 0.2 (cm) x 0.2 (cm) = 0.04 (cm) Tissue and other material debrided: Viable, Non-Viable, Callus, Skin: Epidermis Level: Skin/Epidermis Debridement Description: Selective/Open Wound Instrument:  Curette Bleeding: None End Time: 16:25 Response to Treatment: Procedure was tolerated well Level of Consciousness (Post- Awake and Alert procedure): Post Debridement Measurements of Total Wound Length: (cm) 0.2 Width: (cm) 0.2 Depth: (cm) 0.1 Volume: (cm) 0.003 Character of Wound/Ulcer Post Debridement: Improved Severity of Tissue Post Debridement: Limited to breakdown of skin Post Procedure Diagnosis Same as Pre-procedure Electronic Signature(s) Signed: 08/16/2020 5:26:06 PM By: [name] [name] Signed: 08/18/2020 6:30:39 AM By: [name] Ham MD Entered By: [name] [name] on 08/16/2020 16:47:22 -------------------------------------------------------------------------------- HPI Details Patient Name: Date of Service: [name] [name], [name] 08/16/2020 2:45 PM Medical Record Number: [id number] Patient Account Number: [id number] Date of Birth/Sex: Treating RN: 1971/05/29 (49 y.o. [name] [name] Primary Care Provider: Ferd [name] Other Clinician: Referring Provider: Treating Provider/Extender: [name] [name] in Treatment: 0 History of Present Illness HPI Description: 04/21/18 ADMISSION This is a 49 year old man who is a type II diabetic. In spite of fact his hemoglobin A1c is actually quite good 5.73 months ago he is at a really difficult time over the last 6-7 months. He developed a rapidly progressive infection in the right foot in November 2018 associated with osteomyelitis and necrotizing fasciitis. 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 The stump required and a revision on 11/24/17. The stump revision was advertised as being secondary to falls. I'm not sure the progressive history here however this area is actually closed over. The patient tells me over the same timeframe he has had wounds on the plantar aspect of his left foot. In the ED saw Dr. Sharol [name] in April of this year. 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 It is really not clear that this patient is been dressing this with anything. He came in with the clinic without any specific dressing on the wound areas using his own tennis shoe. He tells me that he only ambulates of course to do a pivot transfer. He does not  have his prosthesis for the right leg as of yet and he blames Medicaid for this. He does however use the foot to push himself along in his wheelchair at home. The patient has not had formal arterial studies. ABI in our clinic on the left was 1.13. Patient's past medical history includes type 2 diabetes, fracture of the right fibula. I see that he was treated for abscesses on his right buttock and chest in 2016. I did not look at the microbiology of this. 04/28/18; patient comes back in the clinic today with the wound pretty much the same as when he came in here last week. Small opening lots of undermining relatively. He tells [place] that nothing is really been on this for 3 days in spite of the fact that we gave him enough to dress this easily especially such a small wound. He says he lives with his mother, she is not capable of assisting with this he is changing the dressings himself. 05/05/18; much better-looking wound today. Smaller. There is some undermining medially however that's only perhaps 2 mm. No surrounding erythema. We've been using silver collagen 05/12/18; small wound on the first metatarsal head. No undermining no surrounding erythema. We've been using silver collagen he has home health coming out to change 05/20/18 the wound on the first metatarsal head looks better. Covered in surface debris/callus nonviable tissue. Required debridement but post debridement this looks quite good. We've been using silver collagen. He has home health 05/27/18; first metatarsal head wound continues to improve. Just about completely closed. Still a lot of surface debridement callus. We've been using silver collagen 06/06/18; the first  metatarsal wound is completely healed over. There is still a lot of callus. I gently removed some of this just to make sure that there was no open area and there is not. The patient mentions to me that his left leg has felt like "lead" for about a week READMISSION 08/16/2020 This is a 49 year old man with type 2 diabetes. He returns to clinic today with a 1 month history of a blister and callus over the first metatarsal head. This is in exactly the same area as when he was here in 2019. 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 He has standard running shoes on the left foot to match the area in his prosthesis. He has only been covering this area with a Band-Aid has not really been specifically dressing this. ABI in our clinic on the left was 1.16. This is essentially stable from the value in 2019 Electronic Signature(s) Signed: 08/18/2020 6:30:39 AM By: [name] Ham MD Entered By: [name] [name] on 08/16/2020 17:09:38 -------------------------------------------------------------------------------- Physical Exam Details Patient Name: Date of Service: [name] [name], [name] 08/16/2020 2:45 PM Medical Record Number: [id number] Patient Account Number: [id number] Date of Birth/Sex: Treating RN: [birth year] (49 y.o. [name] [name] Primary Care Provider: Ferd [name] Other Clinician: Referring Provider: Treating Provider/Extender: [name] [name] in Treatment: 0 Constitutional Sitting or standing Blood Pressure is within target range for patient.. Pulse regular and within target range for patient.Marland Kitchen Respirations regular, non-labored and within target range.. Temperature is normal and within the target range for the patient.Marland Kitchen Appears in no distress. Respiratory work of breathing is normal. Cardiovascular Pedal pulses are palpable at the dorsalis pedis and posterior tibial.. Integumentary (Hair, Skin) Skin is dry and  flaking in his lower leg and dorsal left foot.. Neurological Diabetic insensate neuropathy to the  monofilament and vibration. Notes Wound exam; the area is on the distal part of the left first metatarsal head. There is thick callus here. Splitting. I used pickups and a #15 scalpel to remove the callus and debrided from around the wound which is a small linear wound with a reasonably healthy looking base. There is no evidence of infection no cultures were felt to be necessary. Electronic Signature(s) Signed: 08/18/2020 6:30:39 AM By: [name] Ham MD Entered By: [name] [name] on 08/16/2020 17:11:24 -------------------------------------------------------------------------------- Physician Orders Details Patient Name: Date of Service: [name] [name], [name] 08/16/2020 2:45 PM Medical Record Number: [id number] Patient Account Number: [id number] Date of Birth/Sex: Treating RN: Mar 30, 1971 (49 y.o. [name] [name] Primary Care Provider: Ferd [name] Other Clinician: Referring Provider: Treating Provider/Extender: [name] [name] in Treatment: 0 Verbal / Phone Orders: No Diagnosis Coding Follow-up Appointments Return Appointment in 1 week. Dressing Change Frequency Wound #2 Left Metatarsal head first Change Dressing every other day. Wound Cleansing May shower and wash wound with soap and water. - when dressing is changed Primary Wound Dressing Wound #2 Left Metatarsal head first Silver Collagen - moisten with normal saline Secondary Dressing Kerlix/Rolled Gauze Dry Gauze - felt site Off-Loading Other: - can wear shoe that you have at home Electronic Signature(s) Signed: 08/16/2020 5:26:06 PM By: [name] [name] Signed: 08/18/2020 6:30:39 AM By: [name] Ham MD Entered By: [name] [name] on 08/16/2020 16:32:50 -------------------------------------------------------------------------------- Problem List Details Patient Name: Date of  Service: [name] [name], [name] 08/16/2020 2:45 PM Medical Record Number: [id number] Patient Account Number: [id number] Date of Birth/Sex: Treating RN: 1971/04/01 (49 y.o. [name] [name] Primary Care Provider: Ferd [name] Other Clinician: Referring Provider: Treating Provider/Extender: [name] [name] in Treatment: 0 Active Problems ICD-10 Encounter Code Description Active Date MDM Diagnosis E11.621 Type 2 diabetes mellitus with foot ulcer 08/16/2020 No Yes L97.522 Non-pressure chronic ulcer of other part of left foot with fat layer exposed 08/16/2020 No Yes E11.42 Type 2 diabetes mellitus with diabetic polyneuropathy 08/16/2020 No Yes Z89.511 Acquired absence of right leg below knee 08/16/2020 No Yes Inactive Problems Resolved Problems Electronic Signature(s) Signed: 08/18/2020 6:30:39 AM By: [name] Ham MD Entered By: [name] [name] on 08/16/2020 16:46:26 -------------------------------------------------------------------------------- Progress Note Details Patient Name: Date of Service: [name] [name], [name] 08/16/2020 2:45 PM Medical Record Number: [id number] Patient Account Number: [id number] Date of Birth/Sex: Treating RN: 03/20/1971 (49 y.o. [name] [name] Primary Care Provider: Ferd [name] Other Clinician: Referring Provider: Treating Provider/Extender: [name] [name] in Treatment: 0 Subjective Chief Complaint Information obtained from Patient 04/21/18; patient is here for review of a wound on his plantar left first metatarsal head 08/16/2020; patient is here for a review of the wound on his plantar left first metatarsal head History of Present Illness (HPI) 04/21/18 ADMISSION This is a 49 year old man who is a type II diabetic. In spite of fact his hemoglobin A1c is actually quite good 5.73 months ago he is at a really difficult time over the last 6-7 months. He developed a rapidly progressive infection  in the right foot in November 2018 associated with osteomyelitis and necrotizing fasciitis. 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 The stump required and a revision on 11/24/17. The stump revision was advertised as being secondary to falls. I'm not sure the progressive history here however this area is actually closed over. The patient tells me over the same timeframe he has had wounds on the plantar aspect of his left foot.  In the ED saw Dr. Sharol [name] in April of this year. 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 It is really not clear that this patient is been dressing this with anything. He came in with the clinic without any specific dressing on the wound areas using his own tennis shoe. He tells me that he only ambulates of course to do a pivot transfer. He does not have his prosthesis for the right leg as of yet and he blames Medicaid for this. He does however use the foot to push himself along in his wheelchair at home. The patient has not had formal arterial studies. ABI in our clinic on the left was 1.13. Patient's past medical history includes type 2 diabetes, fracture of the right fibula. I see that he was treated for abscesses on his right buttock and chest in 2016. I did not look at the microbiology of this. 04/28/18; patient comes back in the clinic today with the wound pretty much the same as when he came in here last week. Small opening lots of undermining relatively. He tells [place] that nothing is really been on this for 3 days in spite of the fact that we gave him enough to dress this easily especially such a small wound. He says he lives with his mother, she is not capable of assisting with this he is changing the dressings himself. 05/05/18; much better-looking wound today. Smaller. There is some undermining medially however that's only perhaps 2 mm. No surrounding erythema. We've been using silver collagen 05/12/18; small wound on the first  metatarsal head. No undermining no surrounding erythema. We've been using silver collagen he has home health coming out to change 05/20/18 the wound on the first metatarsal head looks better. Covered in surface debris/callus nonviable tissue. Required debridement but post debridement this looks quite good. We've been using silver collagen. He has home health 05/27/18; first metatarsal head wound continues to improve. Just about completely closed. Still a lot of surface debridement callus. We've been using silver collagen 06/06/18; the first metatarsal wound is completely healed over. There is still a lot of callus. I gently removed some of this just to make sure that there was no open area and there is not. The patient mentions to me that his left leg has felt like "lead" for about a week READMISSION 08/16/2020 This is a 49 year old man with type 2 diabetes. He returns to clinic today with a 1 month history of a blister and callus over the first metatarsal head. This is in exactly the same area as when he was here in 2019. 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 He has standard running shoes on the left foot to match the area in his prosthesis. He has only been covering this area with a Band-Aid has not really been specifically dressing this. ABI in our clinic on the left was 1.16. This is essentially stable from the value in 2019 Patient History Information obtained from Patient. Allergies adhesive tape (Severity: Mild, Reaction: rash) Family History Diabetes - Maternal Grandparents,Mother, Heart Disease - Father, Hypertension - Father, No family history of Cancer, Hereditary Spherocytosis, Kidney Disease, Lung Disease, Seizures, Stroke, Thyroid Problems, Tuberculosis. Social History Current every day smoker, Marital Status - Single, Alcohol Use - Never, Drug Use - No History, Caffeine Use - Never. Medical History Eyes Denies history of  Cataracts, Glaucoma, Optic Neuritis Ear/Nose/Mouth/Throat Denies history  of Chronic sinus problems/congestion, Middle ear problems Hematologic/Lymphatic Patient has history of Anemia Denies history of Hemophilia, Human Immunodeficiency Virus, Lymphedema, Sickle Cell Disease Respiratory Denies history of Aspiration, Asthma, Chronic Obstructive Pulmonary Disease (COPD), Pneumothorax, Sleep Apnea, Tuberculosis Cardiovascular Patient has history of Hypertension, Peripheral Venous Disease Denies history of Angina, Arrhythmia, Congestive Heart Failure, Coronary Artery Disease, Deep Vein Thrombosis, Hypotension, Myocardial Infarction, Peripheral Arterial Disease, Phlebitis, Vasculitis Gastrointestinal Denies history of Cirrhosis , Colitis, Crohnoos, Hepatitis A, Hepatitis B, Hepatitis C Endocrine Patient has history of Type II Diabetes - oral meds Denies history of Type I Diabetes Genitourinary Denies history of End Stage Renal Disease Immunological Denies history of Lupus Erythematosus, Raynaudoos, Scleroderma Integumentary (Skin) Denies history of History of Burn Musculoskeletal Denies history of Gout, Rheumatoid Arthritis, Osteoarthritis, Osteomyelitis Neurologic Denies history of Dementia, Neuropathy, Quadriplegia, Paraplegia, Seizure Disorder Oncologic Denies history of Received Chemotherapy, Received Radiation Psychiatric Denies history of Anorexia/bulimia, Confinement Anxiety Medical A Surgical History Notes [name] [name] Constitutional Symptoms (General Health) falls , leukocytosis Respiratory During waking hours and catches himself not breathing, "gasp for air". Saw Dr Wynonia Lawman, he couldn't find a reason Review of Systems (ROS) Constitutional Symptoms ([place]) Denies complaints or symptoms of Fatigue, Fever, Chills, Marked Weight Change. Eyes Complains or has symptoms of Glasses / Contacts - glasses. Denies complaints or symptoms of Dry Eyes, Vision  Changes. Ear/Nose/Mouth/Throat Denies complaints or symptoms of Chronic sinus problems or rhinitis. Respiratory Denies complaints or symptoms of Chronic or frequent coughs, Shortness of Breath. Cardiovascular Denies complaints or symptoms of Chest pain. Gastrointestinal Denies complaints or symptoms of Frequent diarrhea, Nausea, Vomiting. Endocrine Denies complaints or symptoms of Heat/cold intolerance. Genitourinary Denies complaints or symptoms of Frequent urination. Integumentary (Skin) Complains or has symptoms of Wounds. Musculoskeletal Denies complaints or symptoms of Muscle Pain, Muscle Weakness. Neurologic Denies complaints or symptoms of Numbness/parasthesias. Psychiatric Denies complaints or symptoms of Claustrophobia, Suicidal. Objective Constitutional Sitting or standing Blood Pressure is within target range for patient.. Pulse regular and within target range for patient.Marland Kitchen Respirations regular, non-labored and within target range.. Temperature is normal and within the target range for the patient.Marland Kitchen Appears in no distress. Vitals Time Taken: 3:41 PM, Height: 77 in, Source: Stated, Weight: 232 lbs, Source: Stated, BMI: 27.5, Temperature: 98.6 F, Pulse: 107 bpm, Respiratory Rate: 18 breaths/min, Blood Pressure: 142/85 mmHg. Respiratory work of breathing is normal. Cardiovascular Pedal pulses are palpable at the dorsalis pedis and posterior tibial.. Neurological Diabetic insensate neuropathy to the monofilament and vibration. General Notes: Wound exam; the area is on the distal part of the left first metatarsal head. There is thick callus here. Splitting. I used pickups and a #15 scalpel to remove the callus and debrided from around the wound which is a small linear wound with a reasonably healthy looking base. There is no evidence of infection no cultures were felt to be necessary. Integumentary (Hair, Skin) Skin is dry and flaking in his lower leg and dorsal left  foot.. Wound #2 status is Open. Original cause of wound was Blister. The wound is located on the Left Metatarsal head first. The wound measures 0.2cm length x 0.2cm width x 0.1cm depth; 0.031cm^2 area and 0.003cm^3 volume. There is Fat Layer (Subcutaneous Tissue) exposed. There is no tunneling or undermining noted. There is a medium amount of serosanguineous drainage noted. There is large (67-100%) pink granulation within the wound bed. There is no necrotic tissue within the wound bed. 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 There was a Selective/Open Wound Skin/Epidermis Debridement with a total area of 0.04 sq cm performed by [name] [name]., MD. With the following instrument(s): Curette to remove Viable and Non-Viable tissue/material. Material removed includes Callus and Skin: Epidermis and after achieving pain control using Other (benzocaine, 20%). No specimens were taken. A time out was conducted at 16:25, prior to the start of the procedure. There was no bleeding. The procedure was tolerated well. Post Debridement Measurements: 0.2cm length x 0.2cm width x 0.1cm depth; 0.003cm^3 volume. Character of Wound/Ulcer Post Debridement is improved. Severity of Tissue Post Debridement is: Limited to breakdown of skin. Post procedure Diagnosis Wound #2: Same as Pre-Procedure Plan Follow-up Appointments: Return Appointment in 1 week. Dressing Change Frequency: Wound #2 Left Metatarsal head first: Change Dressing every other day. Wound Cleansing: May shower and wash wound  with soap and water. - when dressing is changed Primary Wound Dressing: Wound #2 Left Metatarsal head first: Silver Collagen - moisten with normal saline Secondary Dressing: Kerlix/Rolled Gauze Dry Gauze - felt site Off-Loading: Other: - can wear shoe that you have at home 1. We use moistened silver collagen kerlix and rolled gauze. 2. We will need to change this at home 3. I asked him to keep off his feet as much as possible. 4. I wonder whether he would need a custom diabetic shoe for the left foot with custom inserts. This would have to come from primary care or whomever is managing his diabetes otherwise insurances will not cover this. 5. I see no evidence of infection. No x-rays will be done. No cultures and no antibiotics were felt to be necessary. I spent 30 minutes in review of this patient's past medical history, face-to-face evaluation and preparation of this record Electronic Signature(s) Signed: 08/18/2020 6:30:39 AM By: [name] Ham MD Entered By: [name] [name] on 08/16/2020 17:13:46 -------------------------------------------------------------------------------- HxROS Details Patient Name: Date of Service: [name] [name], [place] L. 08/16/2020 2:45 PM Medical Record Number: [id number] Patient Account Number: [id number] Date of Birth/Sex: Treating RN: 04/04/1971 (49 y.o. [name] Primary Care Provider: Ferd [name] Other Clinician: Referring Provider: Treating Provider/Extender: [name] [name] in Treatment: 0 Information Obtained From Patient Constitutional Symptoms (General Health) Complaints and Symptoms: Negative for: Fatigue; Fever; Chills; Marked Weight Change Medical History: Past Medical History Notes: falls , leukocytosis Eyes Complaints and Symptoms: Positive for: Glasses / Contacts - glasses Negative for: Dry Eyes; Vision Changes Medical History: Negative for: Cataracts; Glaucoma; Optic  Neuritis Ear/Nose/Mouth/Throat Complaints and Symptoms: Negative for: Chronic sinus problems or rhinitis Medical History: Negative for: Chronic sinus problems/congestion; Middle ear problems Respiratory Complaints and Symptoms: Negative for: Chronic or frequent coughs; Shortness of Breath Medical History: Negative for: Aspiration; Asthma; Chronic Obstructive Pulmonary Disease (COPD); Pneumothorax; Sleep Apnea; Tuberculosis Past Medical History Notes: During waking hours and catches himself not breathing, "gasp for air". Saw Dr Wynonia Lawman, he couldn't find a reason Cardiovascular Complaints and Symptoms: Negative for: Chest pain Medical History: Positive for: Hypertension; Peripheral Venous Disease Negative for: Angina; Arrhythmia; Congestive Heart Failure; Coronary Artery Disease; Deep Vein Thrombosis; Hypotension; Myocardial Infarction; Peripheral Arterial Disease; Phlebitis; Vasculitis Gastrointestinal Complaints and Symptoms: Negative for: Frequent diarrhea; Nausea;  Vomiting Medical History: Negative for: Cirrhosis ; Colitis; Crohns; Hepatitis A; Hepatitis B; Hepatitis C Endocrine Complaints and Symptoms: Negative for: Heat/cold intolerance Medical History: Positive for: Type II Diabetes - oral meds Negative for: Type I Diabetes Time with diabetes: 2014 Treated with: Oral agents Blood sugar tested every day: Yes Tested : Blood sugar testing results: Breakfast: 91 Genitourinary Complaints and Symptoms: Negative for: Frequent urination Medical History: Negative for: End Stage Renal Disease Integumentary (Skin) Complaints and Symptoms: Positive for: Wounds Medical History: Negative for: History of Burn Musculoskeletal Complaints and Symptoms: Negative for: Muscle Pain; Muscle Weakness Medical History: Negative for: Gout; Rheumatoid Arthritis; Osteoarthritis; Osteomyelitis Neurologic Complaints and Symptoms: Negative for: Numbness/parasthesias Medical  History: Negative for: Dementia; Neuropathy; Quadriplegia; Paraplegia; Seizure Disorder Psychiatric Complaints and Symptoms: Negative for: Claustrophobia; Suicidal Medical History: Negative for: Anorexia/bulimia; Confinement Anxiety Hematologic/Lymphatic Medical History: Positive for: Anemia Negative for: Hemophilia; Human Immunodeficiency Virus; Lymphedema; Sickle Cell Disease Immunological Medical History: Negative for: Lupus Erythematosus; Raynauds; Scleroderma Oncologic Medical History: Negative for: Received Chemotherapy; Received Radiation Immunizations Pneumococcal Vaccine: Received Pneumococcal Vaccination: No Implantable Devices No devices added Family and Social History Cancer: No; Diabetes: Yes - Maternal Grandparents,Mother; Heart Disease: Yes - Father; Hereditary Spherocytosis: No; Hypertension: Yes - Father; Kidney Disease: No; Lung Disease: No; Seizures: No; Stroke: No; Thyroid Problems: No; Tuberculosis: No; Current every day smoker; Marital Status - Single; Alcohol Use: Never; Drug Use: No History; Caffeine Use: Never; Financial Concerns: No; Food, Clothing or Shelter Needs: No; Support System Lacking: No; Transportation Concerns: No Electronic Signature(s) Signed: 08/16/2020 4:51:09 PM By: [name] RN Signed: 08/18/2020 6:30:39 AM By: [name] Ham MD Entered By: [name] on 08/16/2020 15:43:35 -------------------------------------------------------------------------------- SuperBill Details Patient Name: Date of Service: [name] [name], [name] 08/16/2020 Medical Record Number: [id number] Patient Account Number: [id number] Date of Birth/Sex: Treating RN: 09-08-1971 (49 y.o. 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 Facility Procedures CPT4 Code: 91478295 Description: 99213 - WOUND CARE VISIT-LEV 3 EST PT Modifier: 25 Quantity: 1 CPT4 Code: 62130865 Description: 78469 - DEBRIDE WOUND 1ST 20 SQ CM OR < ICD-10 Diagnosis Description L97.522 Non-pressure chronic ulcer of other part of left foot with fat layer exposed Modifier: Quantity: 1 Physician Procedures : CPT4 Code Description Modifier [id number] 13244 - WC PHYS LEVEL 4 - EST PT 25 ICD-10 Diagnosis Description E11.621 Type 2 diabetes mellitus with foot ulcer L97.522 Non-pressure chronic ulcer of other part of left foot with fat layer exposed E11.42 Type 2  diabetes mellitus with diabetic polyneuropathy Quantity: 1 : [id number] - WC PHYS DEBR WO ANESTH 20 SQ CM ICD-10 Diagnosis Description L97.522 Non-pressure chronic ulcer of other part of left foot with fat layer exposed Quantity: 1 Electronic Signature(s) Signed: 08/18/2020 6:30:39 AM By: [name] Ham MD Entered By: [name] [name] on 08/16/2020 17:14:11

## 2020-08-18 NOTE — Progress Notes (Signed)
[name] [name] ([id number]) Visit Report for 08/16/2020 Allergy List Details Patient Name: Date of Service: [name] [name] 08/16/2020 2:45 PM Medical Record Number: [id number] Patient Account Number: [id number] Date of Birth/Sex: Treating RN: June 14, 1971 (49 y.o. [name] [name]) [name] [name] Primary Care [name] [name]: [name] Other Clinician: Referring [name] [name]: Treating [name] [name]/Extender: [name] [name] in Treatment: 0 Allergies Active Allergies adhesive tape Reaction: rash Severity: Mild Allergy Notes Electronic Signature(s) Signed: 08/16/2020 4:51:09 PM By: [name] Coria RN Entered By: [name] [name] on 08/16/2020 15:42:17 -------------------------------------------------------------------------------- Arrival Information Details Patient Name: Date of Service: [name] Shire ND, [name] 08/16/2020 2:45 PM Medical Record Number: [id number] Patient Account Number: [id number] Date of Birth/Sex: Treating RN: 11-Oct-1971 (49 y.o. [name] [name]) [name] [name] Primary Care [name] [name]: [name] Other Clinician: Referring [name] [name]: Treating [name] [name]/Extender: [name] [name] in Treatment: 0 Visit Information Patient Arrived: Ambulatory Arrival Time: 15:39 Accompanied By: self Transfer Assistance: None Patient Identification Verified: Yes Secondary Verification Process Completed: Yes Patient Requires Transmission-Based Precautions: No Patient Has Alerts: No History Since Last Visit All ordered tests and consults were completed: No Added or deleted any medications: No Any new allergies or adverse reactions: No Had a fall or experienced change in activities of daily living that may affect risk of falls: No Signs or symptoms of abuse/neglect since last visito No Hospitalized since last visit: No Implantable device outside of the clinic excluding cellular tissue based products placed in the center since last visit: No Pain Present Now: No Electronic  Signature(s) Signed: 08/16/2020 4:51:09 PM By: [name] Coria RN Entered By: [name] [name] on 08/16/2020 15:41:27 -------------------------------------------------------------------------------- Clinic Level of Care Assessment Details Patient Name: Date of Service: [name] [name] 08/16/2020 2:45 PM Medical Record Number: [id number] Patient Account Number: [id number] Date of Birth/Sex: Treating RN: 12/03/1970 (49 y.o. [name] [name] Primary Care [name] [name]: [name] Other Clinician: Referring [name] [name]: Treating [name] [name]/Extender: [name] [name] in Treatment: 0 Clinic Level of Care Assessment Items TOOL 1 Quantity Score X- 1 0 Use when EandM and Procedure is performed on INITIAL visit ASSESSMENTS - Nursing Assessment / Reassessment X- 1 20 General Physical Exam (combine w/ comprehensive assessment (listed just below) when performed on new pt. evals) X- 1 25 Comprehensive Assessment (HX, ROS, Risk Assessments, Wounds Hx, etc.) ASSESSMENTS - Wound and Skin Assessment / Reassessment []  - 0 Dermatologic / Skin Assessment (not related to wound area) ASSESSMENTS - Ostomy and/or Continence Assessment and Care []  - 0 Incontinence Assessment and Management []  - 0 Ostomy Care Assessment and Management (repouching, etc.) PROCESS - Coordination of Care X - Simple Patient / Family Education for ongoing care 1 15 []  - 0 Complex (extensive) Patient / Family Education for ongoing care X- 1 10 Staff obtains Programmer, systems, Records, T Results / Process Orders est []  - 0 Staff telephones HHA, Nursing Homes / Clarify orders / etc []  - 0 Routine Transfer to another Facility (non-emergent condition) []  - 0 Routine Hospital Admission (non-emergent condition) X- 1 15 New Admissions / Biomedical engineer / Ordering NPWT Apligraf, etc. , []  - 0 Emergency Hospital Admission (emergent condition) PROCESS - Special Needs []  - 0 Pediatric / Minor Patient  Management []  - 0 Isolation Patient Management []  - 0 Hearing / Language / Visual special needs []  - 0 Assessment of Community assistance (transportation, D/C planning, etc.) []  - 0 Additional assistance / Altered mentation []  - 0 Support Surface(s) Assessment (bed, cushion, seat, etc.) INTERVENTIONS - Miscellaneous []  - 0 External ear exam []  -  0 Patient Transfer (multiple staff / Civil Service fast streamer / Similar devices) []  - 0 Simple Staple / Suture removal (25 or less) []  - 0 Complex Staple / Suture removal (26 or more) []  - 0 Hypo/Hyperglycemic Management (do not check if billed separately) X- 1 15 Ankle / Brachial Index (ABI) - do not check if billed separately Has the patient been seen at the hospital within the last three years: Yes Total Score: 100 Level Of Care: New/Established - Level 3 Electronic Signature(s) Signed: 08/16/2020 5:26:06 PM By: [name] [name] Entered By: [name] [name] on 08/16/2020 16:36:11 -------------------------------------------------------------------------------- Encounter Discharge Information Details Patient Name: Date of Service: [name] Shire ND, [name] 08/16/2020 2:45 PM Medical Record Number: [id number] Patient Account Number: [id number] Date of Birth/Sex: Treating RN: 1971/01/23 (49 y.o. [name] [name] Primary Care Xabi Wittler: [name] Other Clinician: Referring [name] [name]: Treating [name] [name]/Extender: [name] [name] in Treatment: 0 Encounter Discharge Information Items Post Procedure Vitals Discharge Condition: Stable Temperature (F): 98.6 Ambulatory Status: Ambulatory Pulse (bpm): 107 Discharge Destination: Home Respiratory Rate (breaths/min): 18 Transportation: Private Auto Blood Pressure (mmHg): 142/85 Accompanied By: self Schedule Follow-up Appointment: Yes Clinical Summary of Care: Patient Declined Electronic Signature(s) Signed: 08/16/2020 5:24:54 PM By: [name] [name] Entered By: [name] [name] on  08/16/2020 16:48:36 -------------------------------------------------------------------------------- Lower Extremity Assessment Details Patient Name: Date of Service: [name] [name] 08/16/2020 2:45 PM Medical Record Number: [id number] Patient Account Number: [id number] Date of Birth/Sex: Treating RN: [birth year] (49 y.o. [name] [name]) [name] [name] Primary Care Valdemar Mcclenahan: [name] Other Clinician: Referring Abdiel Blackerby: Treating Ludie Pavlik/Extender: [name] [name] in Treatment: 0 Edema Assessment Assessed: [name]: No] [Right: No] E[Left: dema] [Right: :] Calf Left: Right: Point of Measurement: 50 cm From Medial Instep 38 cm cm Ankle Left: Right: Point of Measurement: 11 cm From Medial Instep 26 cm cm Vascular Assessment Blood Pressure: Brachial: [Left:142] Ankle: [Left:Dorsalis Pedis: 165 1.16] Electronic Signature(s) Signed: 08/16/2020 4:51:09 PM By: [name] Coria RN Entered By: [name] [name] on 08/16/2020 15:55:16 -------------------------------------------------------------------------------- Multi Wound Chart Details Patient Name: Date of Service: [name] Shire ND, [name] 08/16/2020 2:45 PM Medical Record Number: [id number] Patient Account Number: [id number] Date of Birth/Sex: Treating RN: Aug 05, 1971 (49 y.o. [name] [name] Primary Care Brigham Cobbins: [name] Other Clinician: Referring Amandalynn Pitz: Treating Ruari Mudgett/Extender: [name] [name] in Treatment: 0 Vital Signs Height(in): 77 Pulse(bpm): 107 Weight(lbs): 629 Blood Pressure(mmHg): 142/85 Body Mass Index(BMI): 28 Temperature(F): 98.6 Respiratory Rate(breaths/min): 18 Photos: [2:No Photos Left Metatarsal head first] [N/A:N/A N/A] Wound Location: [2:Blister] [N/A:N/A] Wounding Event: [2:Diabetic Wound/Ulcer of the Lower] [N/A:N/A] Primary Etiology: [2:Extremity Anemia, Hypertension, Peripheral] [N/A:N/A] Comorbid History: [2:Venous Disease, Type II Diabetes  06/23/2020] [N/A:N/A] Date Acquired: [2:0] [N/A:N/A] Weeks of Treatment: [2:Open] [N/A:N/A] Wound Status: [2:0.2x0.2x0.1] [N/A:N/A] Measurements L x W x D (cm) [2:0.031] [N/A:N/A] A (cm) : rea [2:0.003] [N/A:N/A] Volume (cm) : [2:Grade 2] [N/A:N/A] Classification: [2:Medium] [N/A:N/A] Exudate A mount: [2:Serosanguineous] [N/A:N/A] Exudate Type: [2:red, brown] [N/A:N/A] Exudate Color: [2:Large (67-100%)] [N/A:N/A] Granulation A mount: [2:Pink] [N/A:N/A] Granulation Quality: [2:None Present (0%)] [N/A:N/A] Necrotic A mount: [2:Fat Layer (Subcutaneous Tissue): Yes N/A] Exposed Structures: [2:Fascia: No Tendon: No Muscle: No Joint: No Bone: No None] [N/A:N/A] Epithelialization: [2:Debridement - Selective/Open Wound N/A] Debridement: Pre-procedure Verification/Time Out 16:25 [N/A:N/A] Taken: [2:Other] [N/A:N/A] Pain Control: [2:Callus] [N/A:N/A] Tissue Debrided: [2:Skin/Epidermis] [N/A:N/A] Level: [2:0.04] [N/A:N/A] Debridement A (sq cm): [2:rea Curette] [N/A:N/A] Instrument: [2:None] [N/A:N/A] Bleeding: [2:Procedure was tolerated well] [N/A:N/A] Debridement Treatment Response: [2:0.2x0.2x0.1] [N/A:N/A] Post Debridement Measurements L x W x D (cm) [2:0.003] [N/A:N/A] Post  Debridement Volume: (cm) [2:Debridement] [N/A:N/A] Treatment Notes Electronic Signature(s) Signed: 08/16/2020 5:26:06 PM By: [name] [name] Signed: 08/18/2020 6:30:39 AM By: Linton Ham MD Entered By: Linton Ham on 08/16/2020 16:47:08 -------------------------------------------------------------------------------- Multi-Disciplinary Care Plan Details Patient Name: Date of Service: [name] Shire ND, [name] 08/16/2020 2:45 PM Medical Record Number: [id number] Patient Account Number: [id number] Date of Birth/Sex: Treating RN: 11-03-1971 (49 y.o. [name] [name] Primary Care Teegan Guinther: [name] Other Clinician: Referring Kelissa Merlin: Treating Meliyah Simon/Extender: [name] [name] in Treatment: 0 Active Inactive Nutrition Nursing Diagnoses: Potential for alteratiion in Nutrition/Potential for imbalanced nutrition Goals: Patient/caregiver verbalizes understanding of need to maintain therapeutic glucose control per primary care physician Date Initiated: 08/16/2020 Target Resolution Date: 09/13/2020 Goal Status: Active Interventions: Provide education on elevated blood sugars and impact on wound healing Notes: Orientation to the Wound Care Program Nursing Diagnoses: Knowledge deficit related to the wound healing center program Goals: Patient/caregiver will verbalize understanding of the [facility] Date Initiated: 08/16/2020 Target Resolution Date: 09/13/2020 Goal Status: Active Interventions: Provide education on orientation to the wound center Notes: Wound/Skin Impairment Nursing Diagnoses: Impaired tissue integrity Goals: Ulcer/skin breakdown will have a volume reduction of 30% by week 4 Date Initiated: 08/16/2020 Target Resolution Date: 09/13/2020 Goal Status: Active Interventions: Provide education on ulcer and skin care Notes: Electronic Signature(s) Signed: 08/16/2020 5:26:06 PM By: [name] [name] Entered By: [name] [name] on 08/16/2020 16:26:06 -------------------------------------------------------------------------------- Pain Assessment Details Patient Name: Date of Service: [name] 08/16/2020 2:45 PM Medical Record Number: [id number] Patient Account Number: [id number] Date of Birth/Sex: Treating RN: 1971/08/18 (49 y.o. [name] [name]) [name] [name] Primary Care Caralee Morea: Other Clinician: [name] Referring Jaila Schellhorn: Treating Jakory Matsuo/Extender: [name] [name] in Treatment: 0 Active Problems Location of Pain Severity and Description of Pain Patient Has Paino No Site Locations Pain Management and Medication Current Pain Management: Electronic Signature(s) Signed:  08/16/2020 4:51:09 PM By: [name] Coria RN Entered By: [name] [name] on 08/16/2020 15:57:56 -------------------------------------------------------------------------------- Patient/Caregiver Education Details Patient Name: Date of Service: [name] [name] 9/24/2021andnbsp2:45 PM Medical Record Number: [id number] Patient Account Number: [id number] Date of Birth/Gender: Treating RN: 1971-05-24 (49 y.o. [name] [name] Primary Care Physician: [name] Other Clinician: Referring Physician: Treating Physician/Extender: [name] [name] in Treatment: 0 Education Assessment Education Provided To: Patient Education Topics Provided Elevated Blood Sugar/ Impact on Healing: Handouts: Elevated Blood Sugars: How Do They Affect Wound Healing Methods: Explain/Verbal Responses: State content correctly [place]: o Handouts: Welcome T [facility] o Methods: Explain/Verbal Responses: State content correctly Wound/Skin Impairment: Handouts: Caring for Your Ulcer Methods: Explain/Verbal Responses: State content correctly Electronic Signature(s) Signed: 08/16/2020 5:26:06 PM By: [name] [name] Entered By: [name] [name] on 08/16/2020 92:11:94 -------------------------------------------------------------------------------- Wound Assessment Details Patient Name: Date of Service: [name] Shire ND, [place] [name] 08/16/2020 2:45 PM Medical Record Number: [id number] Patient Account Number: [id number] Date of Birth/Sex: Treating RN: [birth year] (49 y.o. [name] [name]) [name] [name] Primary Care Colby Catanese: [name] Other Clinician: Referring Kacin Dancy: Treating Rajvi Armentor/Extender: [name] [name] in Treatment: 0 Wound Status Wound Number: 2 Primary Diabetic Wound/Ulcer of the Lower Extremity Etiology: Wound Location: Left Metatarsal head first Wound Status: Open Wounding Event: Blister Comorbid Anemia,  Hypertension, Peripheral Venous Disease, Type II Date Acquired: 06/23/2020 History: Diabetes Weeks Of Treatment: 0 Clustered Wound: No Wound Measurements Length: (cm) 0.2 Width: (cm) 0.2 Depth: (cm) 0.1 Area: (cm) 0.031 Volume: (cm) 0.003 % Reduction in Area: % Reduction in Volume: Epithelialization:  None Tunneling: No Undermining: No Wound Description Classification: Grade 2 Exudate Amount: Medium Exudate Type: Serosanguineous Exudate Color: red, brown Foul Odor After Cleansing: No Slough/Fibrino No Wound Bed Granulation Amount: Large (67-100%) Exposed Structure Granulation Quality: Pink Fascia Exposed: No Necrotic Amount: None Present (0%) Fat Layer (Subcutaneous Tissue) Exposed: Yes Tendon Exposed: No Muscle Exposed: No Joint Exposed: No Bone Exposed: No Treatment Notes Wound #2 (Left Metatarsal head first) 1. Cleanse With Wound Cleanser 3. Primary Dressing Applied Collegen [employer] 4. Secondary Dressing Dry Gauze Roll Gauze 7. Footwear/Offloading device applied Felt/Foam Electronic Signature(s) Signed: 08/16/2020 4:51:09 PM By: [name] Coria RN Entered By: [name] [name] on 08/16/2020 15:57:45 -------------------------------------------------------------------------------- Vitals Details Patient Name: Date of Service: [name] Shire ND, [name] 08/16/2020 2:45 PM Medical Record Number: [id number] Patient Account Number: [id number] Date of Birth/Sex: Treating RN: 1971-07-20 (49 y.o. [name] [name]) [name] [name] Primary Care Cherika Jessie: [name] Other Clinician: Referring Asberry Lascola: Treating Bashir Marchetti/Extender: [name] [name] in Treatment: 0 Vital Signs Time Taken: 15:41 Temperature (F): 98.6 Height (in): 77 Pulse (bpm): 107 Source: Stated Respiratory Rate (breaths/min): 18 Weight (lbs): 232 Blood Pressure (mmHg): 142/85 Source: Stated Reference Range: 80 - 120 mg / dl Body Mass Index (BMI): 27.5 Electronic Signature(s) Signed: 08/16/2020  4:51:09 PM By: [name] Coria RN Entered By: [name] [name] on 08/16/2020 15:42:13

## 2020-08-23 ENCOUNTER — Encounter (HOSPITAL_BASED_OUTPATIENT_CLINIC_OR_DEPARTMENT_OTHER): Payer: Medicare Other | Attending: Internal Medicine | Admitting: Internal Medicine

## 2020-08-23 ENCOUNTER — Other Ambulatory Visit: Payer: Self-pay

## 2020-08-23 DIAGNOSIS — Z89511 Acquired absence of right leg below knee: Secondary | ICD-10-CM | POA: Insufficient documentation

## 2020-08-23 DIAGNOSIS — L97522 Non-pressure chronic ulcer of other part of left foot with fat layer exposed: Secondary | ICD-10-CM | POA: Diagnosis present

## 2020-08-23 DIAGNOSIS — E11621 Type 2 diabetes mellitus with foot ulcer: Secondary | ICD-10-CM | POA: Insufficient documentation

## 2020-08-23 DIAGNOSIS — E1142 Type 2 diabetes mellitus with diabetic polyneuropathy: Secondary | ICD-10-CM | POA: Diagnosis not present

## 2020-08-23 NOTE — Progress Notes (Signed)
LENNON, BOUTWELL ([id number]) Visit Report for 08/23/2020 Debridement Details Patient Name: Date of Service: [name] 08/23/2020 1:00 PM Medical Record Number: [id number] Patient Account Number: [id number] Date of Birth/Sex: Treating RN: 08-20-1971 (49 y.o. [name] Primary Care Provider: [name] Other Clinician: Referring Provider: Treating Provider/Extender: Aurora Mask in Treatment: 1 Debridement Performed for Assessment: Wound #2 Left Metatarsal head first Performed By: Physician Ricard Dillon., MD Debridement Type: Debridement Severity of Tissue Pre Debridement: Fat layer exposed Level of Consciousness (Pre-procedure): Awake and Alert Pre-procedure Verification/Time Out Yes - 13:15 Taken: Start Time: 13:15 Pain Control: Lidocaine 5% topical ointment T Area Debrided (L x W): otal 0.5 (cm) x 0.1 (cm) = 0.05 (cm) Tissue and other material debrided: Viable, Non-Viable, Callus, Subcutaneous, Skin: Dermis Level: Skin/Subcutaneous Tissue Debridement Description: Excisional Instrument: Curette Bleeding: Minimum Hemostasis Achieved: Pressure End Time: 13:15 Procedural Pain: 0 Post Procedural Pain: 0 Response to Treatment: Procedure was tolerated well Level of Consciousness (Post- Awake and Alert procedure): Post Debridement Measurements of Total Wound Length: (cm) 0.5 Width: (cm) 0.1 Depth: (cm) 0.1 Volume: (cm) 0.004 Character of Wound/Ulcer Post Debridement: Improved Severity of Tissue Post Debridement: Fat layer exposed Post Procedure Diagnosis Same as Pre-procedure Electronic Signature(s) Signed: 08/23/2020 4:10:39 PM By: Kela Millin Signed: 08/23/2020 4:24:19 PM By: Linton Ham MD Entered By: Linton Ham on 08/23/2020 14:01:40 -------------------------------------------------------------------------------- HPI Details Patient Name: Date of Service: [name], [name] 08/23/2020 1:00  PM Medical Record Number: [id number] Patient Account Number: [id number] Date of Birth/Sex: Treating RN: [birth year] (49 y.o. [name] Primary Care Provider: [name] Other Clinician: Referring Provider: Treating Provider/Extender: Aurora Mask in Treatment: 1 History of Present Illness HPI Description: 04/21/18 ADMISSION This is a 49 year old man who is a type II diabetic. In spite of fact his hemoglobin A1c is actually quite good 5.73 months ago he is at a really difficult time over the last 6-7 months. He developed a rapidly progressive infection in the right foot in November 2018 associated with osteomyelitis and necrotizing fasciitis. 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 The stump required and a revision on 11/24/17. The stump revision was advertised as being secondary to falls. I'm not sure the progressive history here however this area is actually closed over. The patient tells me over the same timeframe he has had wounds on the plantar aspect of his left foot. In the ED saw Dr. Sharol Given in April of this year. 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 It is really not clear that this patient is been dressing this with anything. He came in with the clinic without any specific dressing on the wound areas using his own tennis shoe. He tells me that he only ambulates of course to do a pivot transfer. He does not have his prosthesis for the right leg as of yet and he blames Medicaid for this. He does however use the foot to push himself along in his wheelchair at home. The patient has not had formal arterial studies. ABI in our clinic on the left was 1.13. Patient's past medical history includes type 2 diabetes, fracture of the right fibula. I see that he was treated for abscesses on his right buttock and chest in 2016. I did not look at the microbiology of this. 04/28/18; patient comes back in the clinic today  with the wound pretty much the same as when he came  in here last week. Small opening lots of undermining relatively. He tells [place] that nothing is really been on this for 3 days in spite of the fact that we gave him enough to dress this easily especially such a small wound. He says he lives with his mother, she is not capable of assisting with this he is changing the dressings himself. 05/05/18; much better-looking wound today. Smaller. There is some undermining medially however that's only perhaps 2 mm. No surrounding erythema. We've been using silver collagen 05/12/18; small wound on the first metatarsal head. No undermining no surrounding erythema. We've been using silver collagen he has home health coming out to change 05/20/18 the wound on the first metatarsal head looks better. Covered in surface debris/callus nonviable tissue. Required debridement but post debridement this looks quite good. We've been using silver collagen. He has home health 05/27/18; first metatarsal head wound continues to improve. Just about completely closed. Still a lot of surface debridement callus. We've been using silver collagen 06/06/18; the first metatarsal wound is completely healed over. There is still a lot of callus. I gently removed some of this just to make sure that there was no open area and there is not. The patient mentions to me that his left leg has felt like "lead" for about a week READMISSION 08/16/2020 This is a 49 year old man with type 2 diabetes. He returns to clinic today with a 1 month history of a blister and callus over the first metatarsal head. This is in exactly the same area as when he was here in 2019. 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 He has standard running shoes on the left foot to match the area in his prosthesis. He has only been covering this area with a Band-Aid has not really been specifically dressing this. ABI in our clinic on the  left was 1.16. This is essentially stable from the value in 2019 10/1 the patient has a linear wound over the left first metatarsal head. Again not a lot of callus thick skin around this that I removed with a #3 curette. This is not go deep to bone or muscle it does not look to be infected. Electronic Signature(s) Signed: 08/23/2020 4:24:19 PM By: Linton Ham MD Entered By: Linton Ham on 08/23/2020 14:04:36 -------------------------------------------------------------------------------- Physician Orders Details Patient Name: Date of Service: [place], [place] [name] 08/23/2020 1:00 PM Medical Record Number: [id number] Patient Account Number: [id number] Date of Birth/Sex: Treating RN: 06-Oct-1971 (49 y.o. [name] Primary Care Provider: [name] Other Clinician: Referring Provider: Treating Provider/Extender: Aurora Mask in Treatment: 1 Verbal / Phone Orders: No Diagnosis Coding ICD-10 Coding Code Description E11.621 Type 2 diabetes mellitus with foot ulcer L97.522 Non-pressure chronic ulcer of other part of left foot with fat layer exposed E11.42 Type 2 diabetes mellitus with diabetic polyneuropathy Z89.511 Acquired absence of right leg below knee Follow-up Appointments Return Appointment in 1 week. Dressing Change Frequency Wound #2 Left Metatarsal head first Change Dressing every other day. Wound Cleansing May shower and wash wound with soap and water. - when dressing is changed Primary Wound Dressing Wound #2 Left Metatarsal head first Silver Collagen - moisten with normal saline Secondary Dressing Foam - foam donut Kerlix/Rolled Gauze Dry Gauze - felt site and felt in shoe Off-Loading Other: - can wear shoe that you have at home Electronic Signature(s) Signed: 08/23/2020 4:10:39 PM By: Kela Millin Signed: 08/23/2020 4:24:19  PM By: Linton Ham MD Entered By: Kela Millin on 08/23/2020  13:19:03 -------------------------------------------------------------------------------- Problem List Details Patient Name: Date of Service: [name], [place] 08/23/2020 1:00 PM Medical Record Number: [id number] Patient Account Number: [id number] Date of Birth/Sex: Treating RN: 1971/02/15 (49 y.o. [name] Primary Care Provider: [name] Other Clinician: Referring Provider: Treating Provider/Extender: Aurora Mask in Treatment: 1 Active Problems ICD-10 Encounter Code Description Active Date MDM Diagnosis E11.621 Type 2 diabetes mellitus with foot ulcer 08/16/2020 No Yes L97.522 Non-pressure chronic ulcer of other part of left foot with fat layer exposed 08/16/2020 No Yes E11.42 Type 2 diabetes mellitus with diabetic polyneuropathy 08/16/2020 No Yes Z89.511 Acquired absence of right leg below knee 08/16/2020 No Yes Inactive Problems Resolved Problems Electronic Signature(s) Signed: 08/23/2020 4:24:19 PM By: Linton Ham MD Entered By: Linton Ham on 08/23/2020 14:00:59 -------------------------------------------------------------------------------- Progress Note Details Patient Name: Date of Service: [place] ND, [name] 08/23/2020 1:00 PM Medical Record Number: [id number] Patient Account Number: [id number] Date of Birth/Sex: Treating RN: [birth year] (49 y.o. [name] Primary Care Provider: [name] Other Clinician: Referring Provider: Treating Provider/Extender: Aurora Mask in Treatment: 1 Subjective History of Present Illness (HPI) 04/21/18 ADMISSION This is a 49 year old man who is a type II diabetic. In spite of fact his hemoglobin A1c is actually quite good 5.73 months ago he is at a really difficult time over the last 6-7 months. He developed a rapidly progressive infection in the right foot in November 2018 associated with osteomyelitis and necrotizing fasciitis. He had a  right BKA on November 21 18. The stump required and a revision on 11/24/17. The stump revision was advertised as being secondary to falls. I'm not sure the progressive history here however this area is actually closed over. The patient tells me over the same timeframe he has had wounds on the plantar aspect of his left foot. In the ED saw Dr. Sharol Given in April of this year. 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 It is really not clear that this patient is been dressing this with anything. He came in with the clinic without any specific dressing on the wound areas using his own tennis shoe. He tells me that he only ambulates of course to do a pivot transfer. He does not have his prosthesis for the right leg as of yet and he blames Medicaid for this. He does however use the foot to push himself along in his wheelchair at home. The patient has not had formal arterial studies. ABI in our clinic on the left was 1.13. Patient's past medical history includes type 2 diabetes, fracture of the right fibula. I see that he was treated for abscesses on his right buttock and chest in 2016. I did not look at the microbiology of this. 04/28/18; patient comes back in the clinic today with the wound pretty much the same as when he came in here last week. Small opening lots of undermining relatively. He tells [place] that nothing is really been on this for 3 days in spite of the fact that we gave him enough to dress this easily especially such a small wound. He says he lives with his mother, she is not capable of assisting with this he is changing the dressings himself. 05/05/18; much better-looking wound today. Smaller. There is some undermining medially however that's only perhaps 2 mm. No surrounding erythema. We've been using silver collagen 05/12/18;  small wound on the first metatarsal head. No undermining no surrounding erythema. We've been using silver collagen he has home  health coming out to change 05/20/18 the wound on the first metatarsal head looks better. Covered in surface debris/callus nonviable tissue. Required debridement but post debridement this looks quite good. We've been using silver collagen. He has home health 05/27/18; first metatarsal head wound continues to improve. Just about completely closed. Still a lot of surface debridement callus. We've been using silver collagen 06/06/18; the first metatarsal wound is completely healed over. There is still a lot of callus. I gently removed some of this just to make sure that there was no open area and there is not. The patient mentions to me that his left leg has felt like "lead" for about a week READMISSION 08/16/2020 This is a 49 year old man with type 2 diabetes. He returns to clinic today with a 1 month history of a blister and callus over the first metatarsal head. This is in exactly the same area as when he was here in 2019. 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 He has standard running shoes on the left foot to match the area in his prosthesis. He has only been covering this area with a Band-Aid has not really been specifically dressing this. ABI in our clinic on the left was 1.16. This is essentially stable from the value in 2019 10/1 the patient has a linear wound over the left first metatarsal head. Again not a lot of callus thick skin around this that I removed with a #3 curette. This is not go deep to bone or muscle it does not look to be infected. Objective Constitutional Vitals Time Taken: 12:52 PM, Height: 77 in, Source: Stated, Weight: 232 lbs, Source: Stated, BMI: 27.5, Temperature: 98.7 F, Pulse: 108 bpm, Respiratory Rate: 18 breaths/min, Blood Pressure: 119/84 mmHg, Capillary Blood Glucose: 148 mg/dl. General Notes: glucose per pt report last night Integumentary (Hair, Skin) Wound #2 status is Open. Original cause of wound was Blister.  The wound is located on the Left Metatarsal head first. The wound measures 0.5cm length x 0.1cm width x 0.1cm depth; 0.039cm^2 area and 0.004cm^3 volume. There is Fat Layer (Subcutaneous Tissue) exposed. There is no tunneling or undermining noted. There is a none present amount of drainage noted. The wound margin is distinct with the outline attached to the wound base. There is large (67-100%) pink granulation within the wound bed. There is no necrotic tissue within the wound bed. Assessment Active Problems ICD-10 Type 2 diabetes mellitus with foot ulcer Non-pressure chronic ulcer of other part of left foot with fat layer exposed Type 2 diabetes mellitus with diabetic polyneuropathy Acquired absence of right leg below knee Procedures Wound #2 Pre-procedure diagnosis of Wound #2 is a Diabetic Wound/Ulcer of the Lower Extremity located on the Left Metatarsal head first .Severity of Tissue Pre Debridement is: Fat layer exposed. There was a Excisional Skin/Subcutaneous Tissue Debridement with a total area of 0.05 sq cm performed by Ricard Dillon., MD. With the following instrument(s): Curette to remove Viable and Non-Viable tissue/material. Material removed includes Callus, Subcutaneous Tissue, and Skin: Dermis after achieving pain control using Lidocaine 5% topical ointment. No specimens were taken. A time out was conducted at 13:15, prior to the start of the procedure. A Minimum amount of bleeding was controlled with Pressure. The procedure was tolerated well with a pain level of 0 throughout and a  pain level of 0 following the procedure. Post Debridement Measurements: 0.5cm length x 0.1cm width x 0.1cm depth; 0.004cm^3 volume. Character of Wound/Ulcer Post Debridement is improved. Severity of Tissue Post Debridement is: Fat layer exposed. Post procedure Diagnosis Wound #2: Same as Pre-Procedure Plan Follow-up Appointments: Return Appointment in 1 week. Dressing Change Frequency: Wound #2  Left Metatarsal head first: Change Dressing every other day. Wound Cleansing: May shower and wash wound with soap and water. - when dressing is changed Primary Wound Dressing: Wound #2 Left Metatarsal head first: Silver Collagen - moisten with normal saline Secondary Dressing: Foam - foam donut Kerlix/Rolled Gauze Dry Gauze - felt site and felt in shoe Off-Loading: Other: - can wear shoe that you have at home 1. Debridement as noted. 2. I think this is all a pressure area with a patient who has a prosthesis on the right leg. There is no good way to offload this. He probably needs a new pair of shoes with insoles to protect the left foot. I have asked him to try and stay off this is much as possible. 3. No evidence of infection or ischemia Electronic Signature(s) Signed: 08/23/2020 4:24:19 PM By: Linton Ham MD Entered By: Linton Ham on 08/23/2020 14:07:34 -------------------------------------------------------------------------------- SuperBill Details Patient Name: Date of Service: [name], [name] 08/23/2020 Medical Record Number: [id number] Patient Account Number: [id number] Date of Birth/Sex: Treating RN: 1971/07/10 (49 y.o. [name] Primary Care Provider: [name] Other Clinician: Referring Provider: Treating Provider/Extender: Aurora Mask in Treatment: 1 Diagnosis Coding ICD-10 Codes Code Description [phone] Type 2 diabetes mellitus with foot ulcer L97.522 Non-pressure chronic ulcer of other part of left foot with fat layer exposed E11.42 Type 2 diabetes mellitus with diabetic polyneuropathy Z89.511 Acquired absence of right leg below knee Facility Procedures CPT4 Code: [id number] Description: 85631 - DEB SUBQ TISSUE 20 SQ CM/< ICD-10 Diagnosis Description L97.522 Non-pressure chronic ulcer of other part of left foot with fat layer exposed Modifier: Quantity: 1 Physician Procedures : CPT4 Code Description  Modifier [id number] - WC PHYS SUBQ TISS 20 SQ CM ICD-10 Diagnosis Description L97.522 Non-pressure chronic ulcer of other part of left foot with fat layer exposed Quantity: 1 Electronic Signature(s) Signed: 08/23/2020 4:24:19 PM By: Linton Ham MD Entered By: Linton Ham on 08/23/2020 14:07:46

## 2020-08-23 NOTE — Progress Notes (Signed)
JESPER, STIREWALT ([id number]) Visit Report for 08/23/2020 Arrival Information Details Patient Name: Date of Service: [name] [name] 08/23/2020 1:00 PM Medical Record Number: [id number] Patient Account Number: [id number] Date of Birth/Sex: Treating RN: 06-18-1971 (49 y.o. [name] Primary Care Allisyn Kunz: [name] Other Clinician: Referring Shrinika Blatz: Treating Stepanie Graver/Extender: Aurora Mask in Treatment: 1 Visit Information History Since Last Visit Added or deleted any medications: No Patient Arrived: Ambulatory Any new allergies or adverse reactions: No Arrival Time: 12:48 Had a fall or experienced change in No Accompanied By: self activities of daily living that may affect Transfer Assistance: None risk of falls: Patient Identification Verified: Yes Signs or symptoms of abuse/neglect since last visito No Secondary Verification Process Completed: Yes Hospitalized since last visit: No Patient Requires Transmission-Based Precautions: No Implantable device outside of the clinic excluding No Patient Has Alerts: No cellular tissue based products placed in the center since last visit: Has Dressing in Place as Prescribed: No Pain Present Now: No Notes removed dressing this am to shower, Electronic Signature(s) Signed: 08/23/2020 4:31:44 PM By: Baruch Gouty RN, BSN Entered By: Baruch Gouty on 08/23/2020 12:52:51 -------------------------------------------------------------------------------- Encounter Discharge Information Details Patient Name: Date of Service: [name], [place]. 08/23/2020 1:00 PM Medical Record Number: [id number] Patient Account Number: [id number] Date of Birth/Sex: Treating RN: June 27, 1971 (49 y.o. [name] Primary Care Sofia Vanmeter: [name] Other Clinician: Referring Ranard Harte: Treating Naesha Buckalew/Extender: Aurora Mask in Treatment: 1 Encounter Discharge Information  Items Post Procedure Vitals Discharge Condition: Stable Temperature (F): 98.7 Ambulatory Status: Ambulatory Pulse (bpm): 108 Discharge Destination: Home Respiratory Rate (breaths/min): 18 Transportation: Private Auto Blood Pressure (mmHg): 119/84 Accompanied By: self Schedule Follow-up Appointment: Yes Clinical Summary of Care: Electronic Signature(s) Signed: 08/23/2020 4:18:11 PM By: Deon Pilling Entered By: Deon Pilling on 08/23/2020 14:27:54 -------------------------------------------------------------------------------- Lower Extremity Assessment Details Patient Name: Date of Service: [name], [name] 08/23/2020 1:00 PM Medical Record Number: [id number] Patient Account Number: [id number] Date of Birth/Sex: Treating RN: 1971/10/24 (49 y.o. [name] Primary Care Rossy Virag: [name] Other Clinician: Referring Vail Vuncannon: Treating Findley Vi/Extender: Aurora Mask in Treatment: 1 Edema Assessment Assessed: [name]: No] [name]: No] Edema: [Left: N] [Right: o] Calf Left: Right: Point of Measurement: 50 cm From Medial Instep 38 cm Ankle Left: Right: Point of Measurement: 11 cm From Medial Instep 26 cm Vascular Assessment Pulses: Dorsalis Pedis Palpable: [Left:Yes] Electronic Signature(s) Signed: 08/23/2020 4:31:44 PM By: Baruch Gouty RN, BSN Entered By: Baruch Gouty on 08/23/2020 12:54:39 -------------------------------------------------------------------------------- Multi Wound Chart Details Patient Name: Date of Service: [name], [name] 08/23/2020 1:00 PM Medical Record Number: [id number] Patient Account Number: [id number] Date of Birth/Sex: Treating RN: June 29, 1971 (49 y.o. [name] Primary Care Eesa Justiss: [name] Other Clinician: Referring Breyson Kelm: Treating Cherry Wittwer/Extender: Aurora Mask in Treatment: 1 Vital Signs Height(in): 77 Capillary Blood Glucose(mg/dl):  148 Weight(lbs): 232 Pulse(bpm): 108 Body Mass Index(BMI): 28 Blood Pressure(mmHg): 119/84 Temperature(F): 98.7 Respiratory Rate(breaths/min): 18 Photos: [2:No Photos Left Metatarsal head first] [N/A:N/A N/A] Wound Location: [2:Blister] [N/A:N/A] Wounding Event: [2:Diabetic Wound/Ulcer of the Lower] [N/A:N/A] Primary Etiology: [2:Extremity Anemia, Hypertension, Peripheral] [N/A:N/A] Comorbid History: [2:Venous Disease, Type II Diabetes 06/23/2020] [N/A:N/A] Date Acquired: [2:1] [N/A:N/A] Weeks of Treatment: [2:Open] [N/A:N/A] Wound Status: [2:0.5x0.1x0.1] [N/A:N/A] Measurements L x W x D (cm) [2:0.039] [N/A:N/A] A (cm) : rea [2:0.004] [N/A:N/A] Volume (cm) : [2:-25.80%] [N/A:N/A] % Reduction in A rea: [2:-33.30%] [N/A:N/A] % Reduction in Volume: [2:Grade 2] [N/A:N/A] Classification: [  2:None Present] [N/A:N/A] Exudate A mount: [2:Distinct, outline attached] [N/A:N/A] Wound Margin: [2:Large (67-100%)] [N/A:N/A] Granulation A mount: [2:Pink] [N/A:N/A] Granulation Quality: [2:None Present (0%)] [N/A:N/A] Necrotic A mount: [2:Fat Layer (Subcutaneous Tissue): Yes N/A] Exposed Structures: [2:Fascia: No Tendon: No Muscle: No Joint: No Bone: No Small (1-33%)] [N/A:N/A] Epithelialization: [2:Debridement - Excisional] [N/A:N/A] Debridement: Pre-procedure Verification/Time Out 13:15 [N/A:N/A] Taken: [2:Lidocaine 5% topical ointment] [N/A:N/A] Pain Control: [2:Callus, Subcutaneous] [N/A:N/A] Tissue Debrided: [2:Skin/Subcutaneous Tissue] [N/A:N/A] Level: [2:0.05] [N/A:N/A] Debridement A (sq cm): [2:rea Curette] [N/A:N/A] Instrument: [2:Minimum] [N/A:N/A] Bleeding: [2:Pressure] [N/A:N/A] Hemostasis A chieved: [2:0] [N/A:N/A] Procedural Pain: [2:0] [N/A:N/A] Post Procedural Pain: [2:Procedure was tolerated well] [N/A:N/A] Debridement Treatment Response: [2:0.5x0.1x0.1] [N/A:N/A] Post Debridement Measurements L x W x D (cm) [2:0.004] [N/A:N/A] Post Debridement Volume: (cm)  [2:Debridement] [N/A:N/A] Treatment Notes Electronic Signature(s) Signed: 08/23/2020 4:10:39 PM By: Kela Millin Signed: 08/23/2020 4:24:19 PM By: Linton Ham MD Entered By: Linton Ham on 08/23/2020 14:01:32 -------------------------------------------------------------------------------- Multi-Disciplinary Care Plan Details Patient Name: Date of Service: [name], [name] 08/23/2020 1:00 PM Medical Record Number: [id number] Patient Account Number: [id number] Date of Birth/Sex: Treating RN: 1971-03-29 (49 y.o. [name] Primary Care Jeston Junkins: [name] Other Clinician: Referring Greer Koeppen: Treating Julius Boniface/Extender: Aurora Mask in Treatment: 1 Active Inactive Nutrition Nursing Diagnoses: Potential for alteratiion in Nutrition/Potential for imbalanced nutrition Goals: Patient/caregiver verbalizes understanding of need to maintain therapeutic glucose control per primary care physician Date Initiated: 08/16/2020 Target Resolution Date: 09/13/2020 Goal Status: Active Interventions: Provide education on elevated blood sugars and impact on wound healing Notes: Orientation to the Wound Care Program Nursing Diagnoses: Knowledge deficit related to the wound healing center program Goals: Patient/caregiver will verbalize understanding of the [facility] Date Initiated: 08/16/2020 Target Resolution Date: 09/13/2020 Goal Status: Active Interventions: Provide education on orientation to the wound center Notes: Wound/Skin Impairment Nursing Diagnoses: Impaired tissue integrity Goals: Ulcer/skin breakdown will have a volume reduction of 30% by week 4 Date Initiated: 08/16/2020 Target Resolution Date: 09/13/2020 Goal Status: Active Interventions: Provide education on ulcer and skin care Notes: Electronic Signature(s) Signed: 08/23/2020 4:10:39 PM By: Kela Millin Entered By: Kela Millin on  08/23/2020 13:09:37 -------------------------------------------------------------------------------- Pain Assessment Details Patient Name: Date of Service: [name], [place] 08/23/2020 1:00 PM Medical Record Number: [id number] Patient Account Number: [id number] Date of Birth/Sex: Treating RN: May 03, 1971 (49 y.o. [name] Primary Care Suriah Peragine: [name] Other Clinician: Referring Shreyas Piatkowski: Treating Briony Parveen/Extender: Aurora Mask in Treatment: 1 Active Problems Location of Pain Severity and Description of Pain Patient Has Paino No Site Locations Rate the pain. Current Pain Level: 0 Pain Management and Medication Current Pain Management: Electronic Signature(s) Signed: 08/23/2020 4:31:44 PM By: Baruch Gouty RN, BSN Entered By: Baruch Gouty on 08/23/2020 12:54:04 -------------------------------------------------------------------------------- Patient/Caregiver Education Details Patient Name: Date of Service: [name], Payton Doughty 10/1/2021andnbsp1:00 PM Medical Record Number: [id number] Patient Account Number: [id number] Date of Birth/Gender: Treating RN: June 24, 1971 (49 y.o. [name] Primary Care Physician: [name] Other Clinician: Referring Physician: Treating Physician/Extender: Aurora Mask in Treatment: 1 Education Assessment Education Provided To: Patient Education Topics Provided Elevated Blood Sugar/ Impact on Healing: Handouts: Elevated Blood Sugars: How Do They Affect Wound Healing Methods: Explain/Verbal Responses: State content correctly Wound/Skin Impairment: Handouts: Caring for Your Ulcer Methods: Explain/Verbal Responses: State content correctly Electronic Signature(s) Signed: 08/23/2020 4:10:39 PM By: Kela Millin Entered By: Kela Millin on 08/23/2020  13:09:58 -------------------------------------------------------------------------------- Wound Assessment Details Patient Name: Date of Service: [name], [name]  [name] 08/23/2020 1:00 PM Medical Record Number: [id number] Patient Account Number: [id number] Date of Birth/Sex: Treating RN: June 08, 1971 (49 y.o. [name] Primary Care Jonathon Tan: [name] Other Clinician: Referring Shaconda Hajduk: Treating Haily Caley/Extender: Aurora Mask in Treatment: 1 Wound Status Wound Number: 2 Primary Diabetic Wound/Ulcer of the Lower Extremity Etiology: Wound Location: Left Metatarsal head first Wound Status: Open Wounding Event: Blister Comorbid Anemia, Hypertension, Peripheral Venous Disease, Type II Date Acquired: 06/23/2020 History: Diabetes Weeks Of Treatment: 1 Clustered Wound: No Wound Measurements Length: (cm) 0.5 Width: (cm) 0.1 Depth: (cm) 0.1 Area: (cm) 0.039 Volume: (cm) 0.004 % Reduction in Area: -25.8% % Reduction in Volume: -33.3% Epithelialization: Small (1-33%) Tunneling: No Undermining: No Wound Description Classification: Grade 2 Wound Margin: Distinct, outline attached Exudate Amount: None Present Foul Odor After Cleansing: No Slough/Fibrino No Wound Bed Granulation Amount: Large (67-100%) Exposed Structure Granulation Quality: Pink Fascia Exposed: No Necrotic Amount: None Present (0%) Fat Layer (Subcutaneous Tissue) Exposed: Yes Tendon Exposed: No Muscle Exposed: No Joint Exposed: No Bone Exposed: No Treatment Notes Wound #2 (Left Metatarsal head first) 1. Cleanse With Wound Cleanser 3. Primary Dressing Applied Collegen AG Hydrogel or K-Y Jelly 4. Secondary Dressing Dry Gauze Roll Gauze 5. Secured With Medipore tape 7. Footwear/Offloading device applied Felt/Foam Notes felt added into patient's shoe as well. Electronic Signature(s) Signed: 08/23/2020 4:31:44 PM By: Baruch Gouty RN, BSN Entered By: Baruch Gouty on 08/23/2020 13:00:28 -------------------------------------------------------------------------------- [place] Details Patient Name: Date of Service: [name], Libby [name] 08/23/2020 1:00 PM Medical Record Number: [id number] Patient Account Number: [id number] Date of Birth/Sex: Treating RN: [birth year] (49 y.o. [name] Primary Care Nas Wafer: [name] Other Clinician: Referring Takirah Binford: Treating Bryar Dahms/Extender: Aurora Mask in Treatment: 1 Vital Signs Time Taken: 12:52 Temperature (F): 98.7 Height (in): 77 Pulse (bpm): 108 Source: Stated Respiratory Rate (breaths/min): 18 Weight (lbs): 232 Blood Pressure (mmHg): 119/84 Source: Stated Capillary Blood Glucose (mg/dl): 148 Body Mass Index (BMI): 27.5 Reference Range: 80 - 120 mg / dl Notes glucose per pt report last night Electronic Signature(s) Signed: 08/23/2020 4:31:44 PM By: Baruch Gouty RN, BSN Entered By: Baruch Gouty on 08/23/2020 12:53:32

## 2020-08-30 ENCOUNTER — Encounter (HOSPITAL_BASED_OUTPATIENT_CLINIC_OR_DEPARTMENT_OTHER): Payer: Medicare Other | Admitting: Internal Medicine

## 2020-08-30 DIAGNOSIS — E11621 Type 2 diabetes mellitus with foot ulcer: Secondary | ICD-10-CM | POA: Diagnosis not present

## 2020-09-02 NOTE — Progress Notes (Addendum)
[name], [name] ([id number]) Visit Report for 08/30/2020 Arrival Information Details Patient Name: Date of Service: [name] [name] 08/30/2020 9:15 A M Medical Record Number: [id number] Patient Account Number: [id number] Date of Birth/Sex: Treating RN: June 25, 1971 (49 y.o. M) Primary Care [name] [name]: [name] [name] Other Clinician: Referring [name] [name]: Treating [name] [name]/Extender: Aurora Mask in Treatment: 2 Visit Information History Since Last Visit Added or deleted any medications: No Patient Arrived: Ambulatory Any new allergies or adverse reactions: No Arrival Time: 09:12 Had a fall or experienced change in No Accompanied By: self activities of daily living that may affect Transfer Assistance: None risk of falls: Patient Identification Verified: Yes Signs or symptoms of abuse/neglect since last visito No Secondary Verification Process Completed: Yes Hospitalized since last visit: No Patient Requires Transmission-Based Precautions: No Implantable device outside of the clinic excluding No Patient Has Alerts: No cellular tissue based products placed in the center since last visit: Has Dressing in Place as Prescribed: Yes Pain Present Now: No Electronic Signature(s) Signed: 08/30/2020 11:39:24 AM By: Sandre Kitty Entered By: Sandre Kitty on 08/30/2020 09:14:15 -------------------------------------------------------------------------------- Clinic Level of Care Assessment Details Patient Name: Date of Service: [name] [name] 08/30/2020 9:15 A M Medical Record Number: [id number] Patient Account Number: [id number] Date of Birth/Sex: Treating RN: Apr 28, 1971 (49 y.o. [name] [name] Primary Care [name] [name]: [name] [name] Other Clinician: Referring [name] [name]: Treating [name] [name]/Extender: Aurora Mask in Treatment: 2 Clinic Level of Care Assessment Items TOOL 4 Quantity Score X- 1 0 Use when only an  EandM is performed on FOLLOW-UP visit ASSESSMENTS - Nursing Assessment / Reassessment X- 1 10 Reassessment of Co-morbidities (includes updates in patient status) X- 1 5 Reassessment of Adherence to Treatment Plan ASSESSMENTS - Wound and Skin A ssessment / Reassessment X - Simple Wound Assessment / Reassessment - one wound 1 5 []  - 0 Complex Wound Assessment / Reassessment - multiple wounds []  - 0 Dermatologic / Skin Assessment (not related to wound area) ASSESSMENTS - Focused Assessment X- 1 5 Circumferential Edema Measurements - multi extremities []  - 0 Nutritional Assessment / Counseling / Intervention []  - 0 Lower Extremity Assessment (monofilament, tuning fork, pulses) []  - 0 Peripheral Arterial Disease Assessment (using hand held doppler) ASSESSMENTS - Ostomy and/or Continence Assessment and Care []  - 0 Incontinence Assessment and Management []  - 0 Ostomy Care Assessment and Management (repouching, etc.) PROCESS - Coordination of Care X - Simple Patient / Family Education for ongoing care 1 15 []  - 0 Complex (extensive) Patient / Family Education for ongoing care X- 1 10 Staff obtains Programmer, systems, Records, T Results / Process Orders est []  - 0 Staff telephones HHA, Nursing Homes / Clarify orders / etc []  - 0 Routine Transfer to another Facility (non-emergent condition) []  - 0 Routine Hospital Admission (non-emergent condition) []  - 0 New Admissions / Biomedical engineer / Ordering NPWT Apligraf, etc. , []  - 0 Emergency Hospital Admission (emergent condition) X- 1 10 Simple Discharge Coordination []  - 0 Complex (extensive) Discharge Coordination PROCESS - Special Needs []  - 0 Pediatric / Minor Patient Management []  - 0 Isolation Patient Management []  - 0 Hearing / Language / Visual special needs []  - 0 Assessment of Community assistance (transportation, D/C planning, etc.) []  - 0 Additional assistance / Altered mentation []  - 0 Support Surface(s)  Assessment (bed, cushion, seat, etc.) INTERVENTIONS - Wound Cleansing / Measurement X - Simple Wound Cleansing - one wound 1 5 []  - 0 Complex Wound Cleansing - multiple wounds  X- 1 5 Wound Imaging (photographs - any number of wounds) []  - 0 Wound Tracing (instead of photographs) X- 1 5 Simple Wound Measurement - one wound []  - 0 Complex Wound Measurement - multiple wounds INTERVENTIONS - Wound Dressings X - Small Wound Dressing one or multiple wounds 1 10 []  - 0 Medium Wound Dressing one or multiple wounds []  - 0 Large Wound Dressing one or multiple wounds X- 1 5 Application of Medications - topical []  - 0 Application of Medications - injection INTERVENTIONS - Miscellaneous []  - 0 External ear exam []  - 0 Specimen Collection (cultures, biopsies, blood, body fluids, etc.) []  - 0 Specimen(s) / Culture(s) sent or taken to Lab for analysis []  - 0 Patient Transfer (multiple staff / Civil Service fast streamer / Similar devices) []  - 0 Simple Staple / Suture removal (25 or less) []  - 0 Complex Staple / Suture removal (26 or more) []  - 0 Hypo / Hyperglycemic Management (close monitor of Blood Glucose) []  - 0 Ankle / Brachial Index (ABI) - do not check if billed separately X- 1 5 Vital Signs Has the patient been seen at the hospital within the last three years: Yes Total Score: 95 Level Of Care: New/Established - Level 3 Electronic Signature(s) Signed: 08/30/2020 5:35:06 PM By: Kela Millin Entered By: Kela Millin on 08/30/2020 09:32:07 -------------------------------------------------------------------------------- Encounter Discharge Information Details Patient Name: Date of Service: [name] Shire ND, [name] 08/30/2020 9:15 A M Medical Record Number: [id number] Patient Account Number: [id number] Date of Birth/Sex: Treating RN: July 20, 1971 (49 y.o. [name] [name] Primary Care [name] [name]: [name] [name] Other Clinician: Referring [name] [name]: Treating [name] [name]/Extender: Aurora Mask in Treatment: 2 Encounter Discharge Information Items Discharge Condition: Stable Ambulatory Status: Ambulatory Discharge Destination: Home Transportation: Private Auto Accompanied By: self Schedule Follow-up Appointment: Yes Clinical Summary of Care: Electronic Signature(s) Signed: 08/30/2020 5:50:34 PM By: Deon Pilling Entered By: Deon Pilling on 08/30/2020 09:51:58 -------------------------------------------------------------------------------- Lower Extremity Assessment Details Patient Name: Date of Service: [name] 08/30/2020 9:15 A M Medical Record Number: [id number] Patient Account Number: [id number] Date of Birth/Sex: Treating RN: 04-Jun-1971 (49 y.o. [name] [name] Primary Care Linford Quintela: [name] [name] Other Clinician: Referring Rafaela Dinius: Treating Anora Schwenke/Extender: Aurora Mask in Treatment: 2 Edema Assessment Assessed: [name]: Yes] [name]: No] Edema: [Left: Ye] [Right: s] Calf Left: Right: Point of Measurement: 50 cm From Medial Instep 39 cm Ankle Left: Right: Point of Measurement: 11 cm From Medial Instep 27 cm Vascular Assessment Pulses: Dorsalis Pedis Palpable: [Left:Yes] Electronic Signature(s) Signed: 08/30/2020 5:50:34 PM By: Deon Pilling Entered By: Deon Pilling on 08/30/2020 09:23:08 -------------------------------------------------------------------------------- Multi Wound Chart Details Patient Name: Date of Service: [name] Shire ND, [name] 08/30/2020 9:15 A M Medical Record Number: [id number] Patient Account Number: [id number] Date of Birth/Sex: Treating RN: 1971-01-01 (50 y.o. M) Primary Care Denell Cothern: [name] [name] Other Clinician: Referring Dayton Sherr: Treating Kristoffer Bala/Extender: Aurora Mask in Treatment: 2 Vital Signs Height(in): 18 Capillary Blood Glucose(mg/dl): 55 Weight(lbs): 232 Pulse(bpm): 101 Body Mass Index(BMI): 28 Blood  Pressure(mmHg): 172/89 Temperature(F): 98.6 Respiratory Rate(breaths/min): 18 Photos: [2:No Photos Left Metatarsal head first] [N/A:N/A N/A] Wound Location: [2:Blister] [N/A:N/A] Wounding Event: [2:Diabetic Wound/Ulcer of the Lower] [N/A:N/A] Primary Etiology: [2:Extremity Anemia, Hypertension, Peripheral] [N/A:N/A] Comorbid History: [2:Venous Disease, Type II Diabetes 06/23/2020] [N/A:N/A] Date Acquired: [2:2] [N/A:N/A] Weeks of Treatment: [2:Open] [N/A:N/A] Wound Status: [2:0.4x0.2x0.2] [N/A:N/A] Measurements L x W x D (cm) [2:0.063] [N/A:N/A] A (cm) : rea [2:0.013] [N/A:N/A] Volume (cm) : [2:-103.20%] [N/A:N/A] % Reduction in  A rea: [2:-333.30%] [N/A:N/A] % Reduction in Volume: [2:Grade 2] [N/A:N/A] Classification: [2:Small] [N/A:N/A] Exudate A mount: [2:Serosanguineous] [N/A:N/A] Exudate Type: [2:red, brown] [N/A:N/A] Exudate Color: [2:Distinct, outline attached] [N/A:N/A] Wound Margin: [2:Large (67-100%)] [N/A:N/A] Granulation A mount: [2:Pink] [N/A:N/A] Granulation Quality: [2:None Present (0%)] [N/A:N/A] Necrotic A mount: [2:Fat Layer (Subcutaneous Tissue): Yes N/A] Exposed Structures: [2:Fascia: No Tendon: No Muscle: No Joint: No Bone: No Large (67-100%)] [N/A:N/A] Epithelialization: [2:callus to periwound.] [N/A:N/A] Treatment Notes Electronic Signature(s) Signed: 09/02/2020 5:03:49 PM By: Linton Ham MD Entered By: Linton Ham on 08/30/2020 09:48:36 -------------------------------------------------------------------------------- Multi-Disciplinary Care Plan Details Patient Name: Date of Service: [name] Shire ND, [name] 08/30/2020 9:15 A M Medical Record Number: [id number] Patient Account Number: [id number] Date of Birth/Sex: Treating RN: 1971-03-23 (49 y.o. [name] [name] Primary Care Blayre Papania: [name] [name] Other Clinician: Referring Ksenia Kunz: Treating Yarima Penman/Extender: Aurora Mask in Treatment: 2 Active  Inactive Electronic Signature(s) Signed: 09/05/2020 5:50:35 PM By: Baruch Gouty RN, BSN Signed: 09/12/2020 5:20:09 PM By: Kela Millin Previous Signature: 08/30/2020 5:35:06 PM Version By: Kela Millin Entered By: Baruch Gouty on 09/05/2020 17:24:52 -------------------------------------------------------------------------------- Pain Assessment Details Patient Name: Date of Service: [name] Shire ND, [name] 08/30/2020 9:15 A M Medical Record Number: [id number] Patient Account Number: [id number] Date of Birth/Sex: Treating RN: September 09, 1971 (49 y.o. M) Primary Care Ngozi Alvidrez: [name] [name] Other Clinician: Referring Marchella Hibbard: Treating Slade Pierpoint/Extender: Aurora Mask in Treatment: 2 Active Problems Location of Pain Severity and Description of Pain Patient Has Paino No Site Locations Pain Management and Medication Current Pain Management: Electronic Signature(s) Signed: 08/30/2020 11:39:24 AM By: Sandre Kitty Entered By: Sandre Kitty on 08/30/2020 09:14:45 -------------------------------------------------------------------------------- Patient/Caregiver Education Details Patient Name: Date of Service: [name] [name] 10/8/2021andnbsp9:15 A M Medical Record Number: [id number] Patient Account Number: [id number] Date of Birth/Gender: Treating RN: 12-04-1970 (49 y.o. [name] [name] Primary Care Physician: [name] [name] Other Clinician: Referring Physician: Treating Physician/Extender: Aurora Mask in Treatment: 2 Education Assessment Education Provided To: Patient Education Topics Provided Elevated Blood Sugar/ Impact on Healing: Handouts: Elevated Blood Sugars: How Do They Affect Wound Healing Methods: Explain/Verbal Responses: State content correctly Wound/Skin Impairment: Handouts: Caring for Your Ulcer Methods: Explain/Verbal Responses: State content correctly Electronic  Signature(s) Signed: 08/30/2020 5:35:06 PM By: Kela Millin Entered By: Kela Millin on 08/30/2020 09:13:31 -------------------------------------------------------------------------------- Wound Assessment Details Patient Name: Date of Service: [name] 08/30/2020 9:15 A M Medical Record Number: [id number] Patient Account Number: [id number] Date of Birth/Sex: Treating RN: Nov 10, 1971 (49 y.o. M) Primary Care Twilla Khouri: [name] [name] Other Clinician: Referring Ossiel Marchio: Treating Yancarlos Berthold/Extender: Aurora Mask in Treatment: 2 Wound Status Wound Number: 2 Primary Diabetic Wound/Ulcer of the Lower Extremity Etiology: Wound Location: Left Metatarsal head first Wound Status: Open Wounding Event: Blister Comorbid Anemia, Hypertension, Peripheral Venous Disease, Type II Date Acquired: 06/23/2020 History: Diabetes Weeks Of Treatment: 2 Clustered Wound: No Photos Photo Uploaded By: Mikeal Hawthorne on 09/02/2020 13:04:56 Wound Measurements Length: (cm) 0.4 Width: (cm) 0.2 Depth: (cm) 0.2 Area: (cm) 0.063 Volume: (cm) 0.013 % Reduction in Area: -103.2% % Reduction in Volume: -333.3% Epithelialization: Large (67-100%) Tunneling: No Undermining: No Wound Description Classification: Grade 2 Wound Margin: Distinct, outline attached Exudate Amount: Small Exudate Type: Serosanguineous Exudate Color: red, brown Foul Odor After Cleansing: No Slough/Fibrino No Wound Bed Granulation Amount: Large (67-100%) Exposed Structure Granulation Quality: Pink Fascia Exposed: No Necrotic Amount: None Present (0%) Fat Layer (Subcutaneous Tissue) Exposed: Yes Tendon Exposed: No Muscle Exposed: No Joint Exposed: No  Bone Exposed: No Assessment Notes callus to periwound. Electronic Signature(s) Signed: 08/30/2020 5:50:34 PM By: Deon Pilling Entered By: Deon Pilling on 08/30/2020  09:23:28 -------------------------------------------------------------------------------- Vitals Details Patient Name: Date of Service: [name] Shire ND, [place] 08/30/2020 9:15 A M Medical Record Number: [id number] Patient Account Number: [id number] Date of Birth/Sex: Treating RN: [birth year] (49 y.o. M) Primary Care Curtina Grills: [name] [name] Other Clinician: Referring Vern Guerette: Treating Keeven Matty/Extender: Aurora Mask in Treatment: 2 Vital Signs Time Taken: 09:14 Temperature (F): 98.6 Height (in): 77 Pulse (bpm): 101 Weight (lbs): 232 Respiratory Rate (breaths/min): 18 Body Mass Index (BMI): 27.5 Blood Pressure (mmHg): 172/89 Capillary Blood Glucose (mg/dl): 55 Reference Range: 80 - 120 mg / dl Electronic Signature(s) Signed: 08/30/2020 11:39:24 AM By: Sandre Kitty Entered By: Sandre Kitty on 08/30/2020 09:14:36

## 2020-09-02 NOTE — Progress Notes (Signed)
[name] [name], [name] [name] ([id number]) Visit Report for 08/30/2020 HPI Details Patient Name: Date of Service: [name] [name] 08/30/2020 9:15 A M Medical Record Number: [id number] Patient Account Number: [id number] Date of Birth/Sex: Treating RN: 1970/12/01 (49 y.o. M) Primary Care Provider: [name] Other Clinician: Referring Provider: Treating Provider/Extender: Aurora Mask in Treatment: 2 History of Present Illness HPI Description: 04/21/18 ADMISSION This is a 49 year old man who is a type II diabetic. In spite of fact his hemoglobin A1c is actually quite good 5.73 months ago he is at a really difficult time over the last 6-7 months. He developed a rapidly progressive infection in the right foot in November 2018 associated with osteomyelitis and necrotizing fasciitis. 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 The stump required and a revision on 11/24/17. The stump revision was advertised as being secondary to falls. I'm not sure the progressive history here however this area is actually closed over. The patient tells me over the same timeframe he has had wounds on the plantar aspect of his left foot. In the ED saw Dr. Sharol Given in April of this year. 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 It is really not clear that this patient is been dressing this with anything. He came in with the clinic without any specific dressing on the wound areas using his own tennis shoe. He tells me that he only ambulates of course to do a pivot transfer. He does not have his prosthesis for the right leg as of yet and he blames Medicaid for this. He does however use the foot to push himself along in his wheelchair at home. The patient has not had formal arterial studies. ABI in our clinic on the left was 1.13. Patient's past medical history includes type 2 diabetes, fracture of the right fibula. I see that he was treated for abscesses on his  right buttock and chest in 2016. I did not look at the microbiology of this. 04/28/18; patient comes back in the clinic today with the wound pretty much the same as when he came in here last week. Small opening lots of undermining relatively. He tells [place] that nothing is really been on this for 3 days in spite of the fact that we gave him enough to dress this easily especially such a small wound. He says he lives with his mother, she is not capable of assisting with this he is changing the dressings himself. 05/05/18; much better-looking wound today. Smaller. There is some undermining medially however that's only perhaps 2 mm. No surrounding erythema. We've been using silver collagen 05/12/18; small wound on the first metatarsal head. No undermining no surrounding erythema. We've been using silver collagen he has home health coming out to change 05/20/18 the wound on the first metatarsal head looks better. Covered in surface debris/callus nonviable tissue. Required debridement but post debridement this looks quite good. We've been using silver collagen. He has home health 05/27/18; first metatarsal head wound continues to improve. Just about completely closed. Still a lot of surface debridement callus. We've been using silver collagen 06/06/18; the first metatarsal wound is completely healed over. There is still a lot of callus. I gently removed some of this just to make sure that there was no open area and there is not. The patient mentions to me that his left leg has felt like "lead" for about a week READMISSION 08/16/2020 This is a 49 year old man with  type 2 diabetes. He returns to clinic today with a 1 month history of a blister and callus over the first metatarsal head. This is in exactly the same area as when he was here in 2019. 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 He has standard running shoes on the left foot to match the area in his prosthesis.  He has only been covering this area with a Band-Aid has not really been specifically dressing this. ABI in our clinic on the left was 1.16. This is essentially stable from the value in 2019 10/1 the patient has a linear wound over the left first metatarsal head. Again not a lot of callus thick skin around this that I removed with a #3 curette. This is not go deep to bone or muscle it does not look to be infected. 10/8 first metatarsal head. The wound looks like it is closing however this is going to be a difficult area to offload in the future. He has a size 15 shoe and he says his shoes are years old. The inserts in these current shoes are totally useless and I have advised him to replace these. He may need a new pair of shoes and I have he got original prosthesis at [employer] [place] I have asked him to go back there. Electronic Signature(s) Signed: 09/02/2020 5:03:49 PM By: Linton Ham MD Entered By: Linton Ham on 08/30/2020 09:49:38 -------------------------------------------------------------------------------- Physical Exam Details Patient Name: Date of Service: [name] [name] 08/30/2020 9:15 A M Medical Record Number: [id number] Patient Account Number: [id number] Date of Birth/Sex: Treating RN: October 27, 1971 (49 y.o. M) Primary Care Provider: [name] Other Clinician: Referring Provider: Treating Provider/Extender: Aurora Mask in Treatment: 2 Constitutional Patient is hypertensive.. Pulse regular and within target range for patient.Marland Kitchen Respirations regular, non-labored and within target range.. Temperature is normal and within the target range for the patient.Marland Kitchen Appears in no distress. Cardiovascular Pedal pulses palpable on the left. Notes Wound exam; the area on the distal part of the left first metatarsal head. The area looks like it is coming together. He still has callus in this area but I do not know that we are going to prevent  this in the current situation. There is no evidence of infection Electronic Signature(s) Signed: 09/02/2020 5:03:49 PM By: Linton Ham MD Entered By: Linton Ham on 08/30/2020 09:50:29 -------------------------------------------------------------------------------- Physician Orders Details Patient Name: Date of Service: [name] Shire [place], [place] 08/30/2020 9:15 A M Medical Record Number: [id number] Patient Account Number: [id number] Date of Birth/Sex: Treating RN: 01/05/1971 (49 y.o. [name] Primary Care Provider: [name] Other Clinician: Referring Provider: Treating Provider/Extender: Aurora Mask in Treatment: 2 Verbal / Phone Orders: No Diagnosis Coding ICD-10 Coding Code Description E11.621 Type 2 diabetes mellitus with foot ulcer L97.522 Non-pressure chronic ulcer of other part of left foot with fat layer exposed E11.42 Type 2 diabetes mellitus with diabetic polyneuropathy Z89.511 Acquired absence of right leg below knee Follow-up Appointments Return Appointment in 1 week. Dressing Change Frequency Wound #2 Left Metatarsal head first Change Dressing every other day. Wound Cleansing May shower and wash wound with soap and water. - when dressing is changed Primary Wound Dressing Wound #2 Left Metatarsal head first Silver Collagen - moisten with normal saline Secondary Dressing Foam - foam donut Kerlix/Rolled Gauze Dry Gauze - felt site and felt in shoe Off-Loading Other: - can wear shoe  that you have at home Electronic Signature(s) Signed: 08/30/2020 5:35:06 PM By: Kela Millin Signed: 09/02/2020 5:03:49 PM By: Linton Ham MD Entered By: Kela Millin on 08/30/2020 09:12:55 -------------------------------------------------------------------------------- Problem List Details Patient Name: Date of Service: [name] Shire ND, [name] 08/30/2020 9:15 A M Medical Record Number: [id number] Patient Account Number:  [id number] Date of Birth/Sex: Treating RN: [birth year] (49 y.o. [name] Primary Care Provider: [name] Other Clinician: Referring Provider: Treating Provider/Extender: Aurora Mask in Treatment: 2 Active Problems ICD-10 Encounter Code Description Active Date MDM Diagnosis E11.621 Type 2 diabetes mellitus with foot ulcer 08/16/2020 No Yes L97.522 Non-pressure chronic ulcer of other part of left foot with fat layer exposed 08/16/2020 No Yes E11.42 Type 2 diabetes mellitus with diabetic polyneuropathy 08/16/2020 No Yes Z89.511 Acquired absence of right leg below knee 08/16/2020 No Yes Inactive Problems Resolved Problems Electronic Signature(s) Signed: 09/02/2020 5:03:49 PM By: Linton Ham MD Entered By: Linton Ham on 08/30/2020 09:48:18 -------------------------------------------------------------------------------- Progress Note Details Patient Name: Date of Service: [name] Shire ND, [name] 08/30/2020 9:15 A M Medical Record Number: [id number] Patient Account Number: [id number] Date of Birth/Sex: Treating RN: 02/09/1971 (48 y.o. M) Primary Care Provider: [name] Other Clinician: Referring Provider: Treating Provider/Extender: Aurora Mask in Treatment: 2 Subjective History of Present Illness (HPI) 04/21/18 ADMISSION This is a 49 year old man who is a type II diabetic. In spite of fact his hemoglobin A1c is actually quite good 5.73 months ago he is at a really difficult time over the last 6-7 months. He developed a rapidly progressive infection in the right foot in November 2018 associated with osteomyelitis and necrotizing fasciitis. 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 The stump required and a revision on 11/24/17. The stump revision was advertised as being secondary to falls. I'm not sure the progressive history here however this area is actually closed over. The patient tells me over the same timeframe  he has had wounds on the plantar aspect of his left foot. In the ED saw Dr. Sharol Given in April of this year. 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 It is really not clear that this patient is been dressing this with anything. He came in with the clinic without any specific dressing on the wound areas using his own tennis shoe. He tells me that he only ambulates of course to do a pivot transfer. He does not have his prosthesis for the right leg as of yet and he blames Medicaid for this. He does however use the foot to push himself along in his wheelchair at home. The patient has not had formal arterial studies. ABI in our clinic on the left was 1.13. Patient's past medical history includes type 2 diabetes, fracture of the right fibula. I see that he was treated for abscesses on his right buttock and chest in 2016. I did not look at the microbiology of this. 04/28/18; patient comes back in the clinic today with the wound pretty much the same as when he came in here last week. Small opening lots of undermining relatively. He tells [place] that nothing is really been on this for 3 days in spite of the fact that we gave him enough to dress this easily especially such a small wound. He says he lives with his mother, she is not capable of assisting with this he is changing the dressings himself. 05/05/18; much better-looking wound today. Smaller. There is some  undermining medially however that's only perhaps 2 mm. No surrounding erythema. We've been using silver collagen 05/12/18; small wound on the first metatarsal head. No undermining no surrounding erythema. We've been using silver collagen he has home health coming out to change 05/20/18 the wound on the first metatarsal head looks better. Covered in surface debris/callus nonviable tissue. Required debridement but post debridement this looks quite good. We've been using silver collagen. He has home health 05/27/18;  first metatarsal head wound continues to improve. Just about completely closed. Still a lot of surface debridement callus. We've been using silver collagen 06/06/18; the first metatarsal wound is completely healed over. There is still a lot of callus. I gently removed some of this just to make sure that there was no open area and there is not. The patient mentions to me that his left leg has felt like "lead" for about a week READMISSION 08/16/2020 This is a 49 year old man with type 2 diabetes. He returns to clinic today with a 1 month history of a blister and callus over the first metatarsal head. This is in exactly the same area as when he was here in 2019. 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 He has standard running shoes on the left foot to match the area in his prosthesis. He has only been covering this area with a Band-Aid has not really been specifically dressing this. ABI in our clinic on the left was 1.16. This is essentially stable from the value in 2019 10/1 the patient has a linear wound over the left first metatarsal head. Again not a lot of callus thick skin around this that I removed with a #3 curette. This is not go deep to bone or muscle it does not look to be infected. 10/8 first metatarsal head. The wound looks like it is closing however this is going to be a difficult area to offload in the future. He has a size 15 shoe and he says his shoes are years old. The inserts in these current shoes are totally useless and I have advised him to replace these. He may need a new pair of shoes and I have he got original prosthesis at [employer] [place] I have asked him to go back there. Objective Constitutional Patient is hypertensive.. Pulse regular and within target range for patient.Marland Kitchen Respirations regular, non-labored and within target range.. Temperature is normal and within the target range for the patient.Marland Kitchen Appears in no  distress. Vitals Time Taken: 9:14 AM, Height: 77 in, Weight: 232 lbs, BMI: 27.5, Temperature: 98.6 F, Pulse: 101 bpm, Respiratory Rate: 18 breaths/min, Blood Pressure: 172/89 mmHg, Capillary Blood Glucose: 55 mg/dl. Cardiovascular Pedal pulses palpable on the left. General Notes: Wound exam; the area on the distal part of the left first metatarsal head. The area looks like it is coming together. He still has callus in this area but I do not know that we are going to prevent this in the current situation. There is no evidence of infection Integumentary (Hair, Skin) Wound #2 status is Open. Original cause of wound was Blister. The wound is located on the Left Metatarsal head first. The wound measures 0.4cm length x 0.2cm width x 0.2cm depth; 0.063cm^2 area and 0.013cm^3 volume. There is Fat Layer (Subcutaneous Tissue) exposed. There is no tunneling or undermining noted. There is a small amount of serosanguineous drainage noted. The wound margin is distinct with the outline attached to the  wound base. There is large (67- 100%) pink granulation within the wound bed. There is no necrotic tissue within the wound bed. General Notes: callus to periwound. 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 Dressing Change Frequency: Wound #2 Left Metatarsal head first: Change Dressing every other day. Wound Cleansing: May shower and wash wound with soap and water. - when dressing is changed Primary Wound Dressing: Wound #2 Left Metatarsal head first: Silver Collagen - moisten with normal saline Secondary Dressing: Foam - foam donut Kerlix/Rolled Gauze Dry Gauze - felt site and felt in shoe Off-Loading: Other: - can wear shoe that you have  at home 1. I think that the area is going to be at risk going forward in fact I think that I am surprised in general how long he has managed to keep this together. 2. He clearly needs new insoles for the shoes once he has lost any utility probably years ago. 3. Have also asked him to go back to [employer] to look at new shoes for his prosthesis. 4. I think the area on the left first met head should be closed by next week Electronic Signature(s) Signed: 09/02/2020 5:03:49 PM By: Linton Ham MD Entered By: Linton Ham on 08/30/2020 09:51:35 -------------------------------------------------------------------------------- SuperBill Details Patient Name: Date of Service: [name] Shire [place], [place]. 08/30/2020 Medical Record Number: [id number] Patient Account Number: [id number] Date of Birth/Sex: Treating RN: 1971/03/02 (49 y.o. [name] Primary Care Provider: [name] Other Clinician: Referring Provider: Treating Provider/Extender: Aurora Mask in Treatment: 2 Diagnosis Coding ICD-10 Codes Code Description [phone] Type 2 diabetes mellitus with foot ulcer L97.522 Non-pressure chronic ulcer of other part of left foot with fat layer exposed E11.42 Type 2 diabetes mellitus with diabetic polyneuropathy Z89.511 Acquired absence of right leg below knee Facility Procedures CPT4 Code: [id number] Description: 99213 - WOUND CARE VISIT-LEV 3 EST PT Modifier: Quantity: 1 Physician Procedures : CPT4 Code Description Modifier [id number] - WC PHYS LEVEL 3 - EST PT ICD-10 Diagnosis Description E11.621 Type 2 diabetes mellitus with foot ulcer L97.522 Non-pressure chronic ulcer of other part of left foot with fat layer exposed E11.42 Type 2  diabetes mellitus with diabetic polyneuropathy Z89.511 Acquired absence of right leg below knee Quantity: 1 Electronic Signature(s) Signed: 09/02/2020 5:03:49 PM By: Linton Ham MD Signed: 09/02/2020 5:03:49 PM By:  Linton Ham MD Entered By: Linton Ham on 08/30/2020 09:51:58

## 2020-09-03 ENCOUNTER — Encounter (HOSPITAL_BASED_OUTPATIENT_CLINIC_OR_DEPARTMENT_OTHER): Payer: Medicare Other | Admitting: Internal Medicine

## 2020-09-06 ENCOUNTER — Encounter (HOSPITAL_BASED_OUTPATIENT_CLINIC_OR_DEPARTMENT_OTHER): Payer: Medicare Other | Admitting: Internal Medicine

## 2020-10-01 ENCOUNTER — Encounter (HOSPITAL_BASED_OUTPATIENT_CLINIC_OR_DEPARTMENT_OTHER): Payer: Medicare Other | Attending: Internal Medicine | Admitting: Internal Medicine

## 2020-10-01 ENCOUNTER — Other Ambulatory Visit: Payer: Self-pay

## 2020-10-01 DIAGNOSIS — E1142 Type 2 diabetes mellitus with diabetic polyneuropathy: Secondary | ICD-10-CM | POA: Diagnosis not present

## 2020-10-01 DIAGNOSIS — Z89511 Acquired absence of right leg below knee: Secondary | ICD-10-CM | POA: Insufficient documentation

## 2020-10-01 DIAGNOSIS — E11621 Type 2 diabetes mellitus with foot ulcer: Secondary | ICD-10-CM | POA: Diagnosis present

## 2020-10-01 DIAGNOSIS — L97522 Non-pressure chronic ulcer of other part of left foot with fat layer exposed: Secondary | ICD-10-CM | POA: Diagnosis not present

## 2020-10-02 NOTE — Progress Notes (Signed)
RICHEY, DOOLITTLE ([id number]) Visit Report for 10/01/2020 Debridement Details Patient Name: Date of Service: [name] NDPayton [name] 10/01/2020 3:45 PM Medical Record Number: [id number] Patient Account Number: [id number] Date of Birth/Sex: Treating RN: 1971/03/09 (49 y.o. [name] [name]) [name] Primary Care Provider: [name] Other Clinician: Referring Provider: Treating Provider/Extender: Aurora Mask in Treatment: 6 Debridement Performed for Assessment: Wound #2R Left Metatarsal head first Performed By: Physician Ricard Dillon., MD Debridement Type: Debridement Severity of Tissue Pre Debridement: Fat layer exposed Level of Consciousness (Pre-procedure): Awake and Alert Pre-procedure Verification/Time Out Yes - 17:05 Taken: Start Time: 17:05 Pain Control: Lidocaine 5% topical ointment T Area Debrided (L x W): otal 2.5 (cm) x 2.5 (cm) = 6.25 (cm) Tissue and other material debrided: Viable, Non-Viable, Callus, Subcutaneous, Skin: Dermis , Skin: Epidermis Level: Skin/Subcutaneous Tissue Debridement Description: Excisional Instrument: Blade, Forceps Bleeding: Moderate Hemostasis Achieved: Silver Nitrate End Time: 17:08 Procedural Pain: 0 Post Procedural Pain: 0 Response to Treatment: Procedure was tolerated well Level of Consciousness (Post- Awake and Alert procedure): Post Debridement Measurements of Total Wound Length: (cm) 2.5 Width: (cm) 2.5 Depth: (cm) 0.1 Volume: (cm) 0.491 Character of Wound/Ulcer Post Debridement: Improved Severity of Tissue Post Debridement: Fat layer exposed Post Procedure Diagnosis Same as Pre-procedure Electronic Signature(s) Signed: 10/02/2020 9:36:19 AM By: Linton Ham MD Signed: 10/02/2020 5:51:14 PM By: [name] RN Entered By: Linton Ham on 10/02/2020 09:26:04 -------------------------------------------------------------------------------- HPI Details Patient Name: Date of Service: [name]  [name] [name], [name] 10/01/2020 3:45 PM Medical Record Number: [id number] Patient Account Number: [id number] Date of Birth/Sex: Treating RN: 1971-09-02 (49 y.o. [name] [name] Primary Care Provider: [name] Other Clinician: Referring Provider: Treating Provider/Extender: Aurora Mask in Treatment: 6 History of Present Illness HPI Description: 04/21/18 ADMISSION This is a 50 year old man who is a type II diabetic. In spite of fact his hemoglobin A1c is actually quite good 5.73 months ago he is at a really difficult time over the last 6-7 months. He developed a rapidly progressive infection in the right foot in November 2018 associated with osteomyelitis and necrotizing fasciitis. 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 The stump required and a revision on 11/24/17. The stump revision was advertised as being secondary to falls. I'm not sure the progressive history here however this area is actually closed over. The patient tells me over the same timeframe he has had wounds on the plantar aspect of his left foot. In the ED saw Dr. Sharol Given in April of this year. 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 It is really not clear that this patient is been dressing this with anything. He came in with the clinic without any specific dressing on the wound areas using his own tennis shoe. He tells me that he only ambulates of course to do a pivot transfer. He does not have his prosthesis for the right leg as of yet and he blames Medicaid for this. He does however use the foot to push himself along in his wheelchair at home. The patient has not had formal arterial studies. ABI in our clinic on the left was 1.13. Patient's past medical history includes type 2 diabetes, fracture of the right fibula. I see that he was treated for abscesses on his right buttock and chest in 2016. I did not look at the microbiology of this. 04/28/18; patient comes  back in the clinic today with the wound pretty much  the same as when he came in here last week. Small opening lots of undermining relatively. He tells [place] that nothing is really been on this for 3 days in spite of the fact that we gave him enough to dress this easily especially such a small wound. He says he lives with his mother, she is not capable of assisting with this he is changing the dressings himself. 05/05/18; much better-looking wound today. Smaller. There is some undermining medially however that's only perhaps 2 mm. No surrounding erythema. We've been using silver collagen 05/12/18; small wound on the first metatarsal head. No undermining no surrounding erythema. We've been using silver collagen he has home health coming out to change 05/20/18 the wound on the first metatarsal head looks better. Covered in surface debris/callus nonviable tissue. Required debridement but post debridement this looks quite good. We've been using silver collagen. He has home health 05/27/18; first metatarsal head wound continues to improve. Just about completely closed. Still a lot of surface debridement callus. We've been using silver collagen 06/06/18; the first metatarsal wound is completely healed over. There is still a lot of callus. I gently removed some of this just to make sure that there was no open area and there is not. The patient mentions to me that his left leg has felt like "lead" for about a week READMISSION 08/16/2020 This is a 49 year old man with type 2 diabetes. He returns to clinic today with a 1 month history of a blister and callus over the first metatarsal head. This is in exactly the same area as when he was here in 2019. 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 He has standard running shoes on the left foot to match the area in his prosthesis. He has only been covering this area with a Band-Aid has not really been specifically dressing  this. ABI in our clinic on the left was 1.16. This is essentially stable from the value in 2019 10/1 the patient has a linear wound over the left first metatarsal head. Again not a lot of callus thick skin around this that I removed with a #3 curette. This is not go deep to bone or muscle it does not look to be infected. 10/8 first metatarsal head. The wound looks like it is closing however this is going to be a difficult area to offload in the future. He has a size 15 shoe and he says his shoes are years old. The inserts in these current shoes are totally useless and I have advised him to replace these. He may need a new pair of shoes and I have he got original prosthesis at [employer] [place] I have asked him to go back there. 11/9; the patient has not been here in more than a month. Not sure he was healed when he was here the last time. I told him he would have to get new shoes and certainly offload the area over the left first metatarsal head. He has done nothing of the kind. He arrives back in clinic with the same old new balance sneakers. A very large separating callus over the first metatarsal head on the left Electronic Signature(s) Signed: 10/02/2020 9:36:19 AM By: Linton Ham MD Entered By: Linton Ham on 10/02/2020 09:27:41 -------------------------------------------------------------------------------- Physical Exam Details Patient Name: Date of Service: [name] [name], [name] L. 10/01/2020 3:45 PM Medical Record Number: [id number] Patient Account Number: [id number] Date of Birth/Sex: Treating RN:  15-Jun-1971 (49 y.o. [name] [name] Primary Care Provider: [name] Other Clinician: Referring Provider: Treating Provider/Extender: Aurora Mask in Treatment: 6 Constitutional Sitting or standing Blood Pressure is within target range for patient.. Pulse regular and within target range for patient.Marland Kitchen Respirations regular, non-labored and within  target range.. Temperature is normal and within the target range for the patient.. Notes Wound exam; the patient came back in with a large thick separating callus over the left first metatarsal head. I used pickups and a #10 scalpel to remove this nonviable skin and subcutaneous tissue over the wound margin. This cleans up quite nicely. There is no evidence of infection and the wound remains full- thickness but otherwise superficial. Electronic Signature(s) Signed: 10/02/2020 9:36:19 AM By: Linton Ham MD Entered By: Linton Ham on 10/02/2020 09:32:09 -------------------------------------------------------------------------------- Physician Orders Details Patient Name: Date of Service: [name] [name], [name] 10/01/2020 3:45 PM Medical Record Number: [id number] Patient Account Number: [id number] Date of Birth/Sex: Treating RN: 23-Jan-1971 (49 y.o. [name] [name] Primary Care Provider: [name] Other Clinician: Referring Provider: Treating Provider/Extender: Aurora Mask in Treatment: 6 Verbal / Phone Orders: No Diagnosis Coding ICD-10 Coding Code Description E11.621 Type 2 diabetes mellitus with foot ulcer L97.522 Non-pressure chronic ulcer of other part of left foot with fat layer exposed E11.42 Type 2 diabetes mellitus with diabetic polyneuropathy Z89.511 Acquired absence of right leg below knee Follow-up Appointments Return Appointment in 1 week. Dressing Change Frequency Change Dressing every other day. Wound Cleansing May shower and wash wound with soap and water. - when dressing is changed Primary Wound Dressing Wound #2R Left Metatarsal head first Calcium Alginate with Silver Secondary Dressing Foam - foam donut Kerlix/Rolled Gauze - secure with tape Dry Gauze - felt site and felt in shoe Off-Loading Other: - can wear shoe that you have at home Electronic Signature(s) Signed: 10/02/2020 9:36:19 AM By: Linton Ham  MD Signed: 10/02/2020 5:51:14 PM By: [name] RN Entered By: [name] on 10/01/2020 17:10:34 -------------------------------------------------------------------------------- Problem List Details Patient Name: Date of Service: [name] [name], [name] 10/01/2020 3:45 PM Medical Record Number: [id number] Patient Account Number: [id number] Date of Birth/Sex: Treating RN: 06-16-1971 (49 y.o. [name] [name] Primary Care Provider: [name] Other Clinician: Referring Provider: Treating Provider/Extender: Aurora Mask in Treatment: 6 Active Problems ICD-10 Encounter Code Description Active Date MDM Diagnosis E11.621 Type 2 diabetes mellitus with foot ulcer 08/16/2020 No Yes L97.522 Non-pressure chronic ulcer of other part of left foot with fat layer exposed 08/16/2020 No Yes E11.42 Type 2 diabetes mellitus with diabetic polyneuropathy 08/16/2020 No Yes Z89.511 Acquired absence of right leg below knee 08/16/2020 No Yes Inactive Problems Resolved Problems Electronic Signature(s) Signed: 10/02/2020 9:36:19 AM By: Linton Ham MD Entered By: Linton Ham on 10/02/2020 09:25:45 -------------------------------------------------------------------------------- Progress Note Details Patient Name: Date of Service: [name] [name], [name] 10/01/2020 3:45 PM Medical Record Number: [id number] Patient Account Number: [id number] Date of Birth/Sex: Treating RN: 1971/07/18 (49 y.o. [name] [name] Primary Care Provider: [name] Other Clinician: Referring Provider: Treating Provider/Extender: Aurora Mask in Treatment: 6 Subjective History of Present Illness (HPI) 04/21/18 ADMISSION This is a 49 year old man who is a type II diabetic. In spite of fact his hemoglobin A1c is actually quite good 5.73 months ago he is at a really difficult time over the last 6-7 months. He developed a rapidly progressive infection in the right foot  in November 2018 associated with osteomyelitis and  necrotizing fasciitis. 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 The stump required and a revision on 11/24/17. The stump revision was advertised as being secondary to falls. I'm not sure the progressive history here however this area is actually closed over. The patient tells me over the same timeframe he has had wounds on the plantar aspect of his left foot. In the ED saw Dr. Sharol Given in April of this year. 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 It is really not clear that this patient is been dressing this with anything. He came in with the clinic without any specific dressing on the wound areas using his own tennis shoe. He tells me that he only ambulates of course to do a pivot transfer. He does not have his prosthesis for the right leg as of yet and he blames Medicaid for this. He does however use the foot to push himself along in his wheelchair at home. The patient has not had formal arterial studies. ABI in our clinic on the left was 1.13. Patient's past medical history includes type 2 diabetes, fracture of the right fibula. I see that he was treated for abscesses on his right buttock and chest in 2016. I did not look at the microbiology of this. 04/28/18; patient comes back in the clinic today with the wound pretty much the same as when he came in here last week. Small opening lots of undermining relatively. He tells [place] that nothing is really been on this for 3 days in spite of the fact that we gave him enough to dress this easily especially such a small wound. He says he lives with his mother, she is not capable of assisting with this he is changing the dressings himself. 05/05/18; much better-looking wound today. Smaller. There is some undermining medially however that's only perhaps 2 mm. No surrounding erythema. We've been using silver collagen 05/12/18; small wound on the first metatarsal head. No  undermining no surrounding erythema. We've been using silver collagen he has home health coming out to change 05/20/18 the wound on the first metatarsal head looks better. Covered in surface debris/callus nonviable tissue. Required debridement but post debridement this looks quite good. We've been using silver collagen. He has home health 05/27/18; first metatarsal head wound continues to improve. Just about completely closed. Still a lot of surface debridement callus. We've been using silver collagen 06/06/18; the first metatarsal wound is completely healed over. There is still a lot of callus. I gently removed some of this just to make sure that there was no open area and there is not. The patient mentions to me that his left leg has felt like "lead" for about a week READMISSION 08/16/2020 This is a 49 year old man with type 2 diabetes. He returns to clinic today with a 1 month history of a blister and callus over the first metatarsal head. This is in exactly the same area as when he was here in 2019. 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 He has standard running shoes on the left foot to match the area in his prosthesis. He has only been covering this area with a Band-Aid has not really been specifically dressing this. ABI in our clinic on the left was 1.16. This is essentially stable from the value in 2019 10/1 the patient has a linear wound over the left first metatarsal head. Again not a  lot of callus thick skin around this that I removed with a #3 curette. This is not go deep to bone or muscle it does not look to be infected. 10/8 first metatarsal head. The wound looks like it is closing however this is going to be a difficult area to offload in the future. He has a size 15 shoe and he says his shoes are years old. The inserts in these current shoes are totally useless and I have advised him to replace these. He may need a new pair of shoes and I  have he got original prosthesis at [employer] [place] right I have asked him to go back there. 11/9; the patient has not been here in more than a month. Not sure he was healed when he was here the last time. I told him he would have to get new shoes and certainly offload the area over the left first metatarsal head. He has done nothing of the kind. He arrives back in clinic with the same old new balance sneakers. A very large separating callus over the first metatarsal head on the left Objective Constitutional Sitting or standing Blood Pressure is within target range for patient.. Pulse regular and within target range for patient.Marland Kitchen Respirations regular, non-labored and within target range.. Temperature is normal and within the target range for the patient.. Vitals Time Taken: 3:56 PM, Height: 77 in, Weight: 232 lbs, BMI: 27.5, Temperature: 98.3 F, Pulse: 106 bpm, Respiratory Rate: 18 breaths/min, Blood Pressure: 122/76 mmHg, Capillary Blood Glucose: 120 mg/dl. General Notes: Wound exam; the patient came back in with a large thick separating callus over the left first metatarsal head. I used pickups and a #10 scalpel to remove this nonviable skin and subcutaneous tissue over the wound margin. This cleans up quite nicely. There is no evidence of infection and the wound remains full-thickness but otherwise superficial. Integumentary (Hair, Skin) Wound #2R status is Open. Original cause of wound was Blister. The wound is located on the Left Metatarsal head first. The wound measures 2.5cm length x 2.5cm width x 0.1cm depth; 4.909cm^2 area and 0.491cm^3 volume. There is Fat Layer (Subcutaneous Tissue) exposed. There is no tunneling or undermining noted. There is a medium amount of serosanguineous drainage noted. The wound margin is thickened. There is large (67-100%) pink granulation within the wound bed. There is no necrotic tissue within the wound bed. 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 There was a Excisional Skin/Subcutaneous Tissue Debridement with a total area of 6.25 sq cm performed by Ricard Dillon., MD. With the following instrument(s): Blade, and Forceps to remove Viable and Non-Viable tissue/material. Material removed includes Callus, Subcutaneous Tissue, Skin: Dermis, and Skin: Epidermis after achieving pain control using Lidocaine 5% topical ointment. No specimens were taken. A time out was conducted at 17:05, prior to the start of the procedure. A Moderate amount of bleeding was controlled with Silver Nitrate. The procedure was tolerated well with a pain level of 0 throughout and a pain level of 0 following the procedure. Post Debridement Measurements: 2.5cm length x 2.5cm width x 0.1cm depth; 0.491cm^3 volume. Character of Wound/Ulcer Post  Debridement is improved. Severity of Tissue Post Debridement is: Fat layer exposed. Post procedure Diagnosis Wound #2R: Same as Pre-Procedure Plan Follow-up Appointments: Return Appointment in 1 week. Dressing Change Frequency: Change Dressing every other day. Wound Cleansing: May shower and wash wound with soap and water. - when dressing is changed Primary Wound Dressing: Wound #2R Left Metatarsal head first: Calcium Alginate with Silver Secondary Dressing: Foam - foam donut Kerlix/Rolled Gauze - secure with tape Dry Gauze - felt site and felt in shoe Off-Loading: Other: - can wear shoe that you have at home 1. I will put him back on silver alginate with gauze 2. I do not know that we have a better way to offload  this. 3. I told him that he is going to need new shoes with offloading insoles. As I do not know where I got the idea last time he had been to [employer] however he said he bought his #15 new balance sneakers online. These literally have no support. I told him the same things when he was here a month ago. 4. The patient seems somewhat oblivious about what I am telling him. I do not know if there is an involved family member here to have a discussion with. Electronic Signature(s) Signed: 10/02/2020 9:36:19 AM By: Linton Ham MD Entered By: Linton Ham on 10/02/2020 09:34:39 -------------------------------------------------------------------------------- SuperBill Details Patient Name: Date of Service: [name] [name], [place] L. 10/01/2020 Medical Record Number: [id number] Patient Account Number: [id number] Date of Birth/Sex: Treating RN: 1970/11/29 (49 y.o. [name] [name]) [name] Primary Care Provider: [name] Other Clinician: Referring Provider: Treating Provider/Extender: Aurora Mask in Treatment: 6 Diagnosis Coding ICD-10 Codes Code Description [phone] Type 2 diabetes mellitus with foot ulcer L97.522 Non-pressure chronic ulcer of other part of left foot with fat layer exposed E11.42 Type 2 diabetes mellitus with diabetic polyneuropathy Z89.511 Acquired absence of right leg below knee Facility Procedures CPT4 Code: [id number] Description: [id number] - DEB SUBQ TISSUE 20 SQ CM/< ICD-10 Diagnosis Description L97.522 Non-pressure chronic ulcer of other part of left foot with fat layer exposed E11.621 Type 2 diabetes mellitus with foot ulcer Modifier: Quantity: 1 Physician Procedures : CPT4 Code Description Modifier [id number] - WC PHYS SUBQ TISS 20 SQ CM ICD-10 Diagnosis Description L97.522 Non-pressure chronic ulcer of other part of left foot with fat layer exposed E11.621 Type 2 diabetes mellitus with foot ulcer Quantity: 1 Electronic Signature(s) Signed:  10/02/2020 9:36:19 AM By: Linton Ham MD Entered By: Linton Ham on 10/02/2020 09:35:05

## 2020-10-03 NOTE — Progress Notes (Signed)
[name] [name], [name] [name] ([id number]) Visit Report for 10/01/2020 Arrival Information Details Patient Name: Date of Service: [name] [name] 10/01/2020 3:45 PM Medical Record Number: [id number] Patient Account Number: [id number] Date of Birth/Sex: Treating RN: Sep 30, 1971 (49 y.o. [name] [name]) Dolores Lory, Morey Hummingbird Primary Care Miley Lindon: [name] Other Clinician: Referring Peityn Payton: Treating Donelle Baba/Extender: Aurora Mask in Treatment: 6 Visit Information History Since Last Visit Added or deleted any medications: No Patient Arrived: Ambulatory Any new allergies or adverse reactions: No Arrival Time: 15:56 Had a fall or experienced change in No Accompanied By: self activities of daily living that may affect Transfer Assistance: None risk of falls: Patient Identification Verified: Yes Signs or symptoms of abuse/neglect since last visito No Secondary Verification Process Completed: Yes Hospitalized since last visit: No Patient Requires Transmission-Based Precautions: No Implantable device outside of the clinic excluding No Patient Has Alerts: No cellular tissue based products placed in the center since last visit: Has Dressing in Place as Prescribed: Yes Pain Present Now: Yes Electronic Signature(s) Signed: 10/01/2020 4:13:24 PM By: Sandre Kitty Entered By: Sandre Kitty on 10/01/2020 15:56:38 -------------------------------------------------------------------------------- Encounter Discharge Information Details Patient Name: Date of Service: [name], [name] 10/01/2020 3:45 PM Medical Record Number: [id number] Patient Account Number: [id number] Date of Birth/Sex: Treating RN: 1971-06-25 (49 y.o. [name] [name] Primary Care Lexus Barletta: [name] Other Clinician: Referring Revia Nghiem: Treating Khushi Zupko/Extender: Aurora Mask in Treatment: 6 Encounter Discharge Information Items Post Procedure Vitals Discharge Condition:  Stable Temperature (F): 98.3 Ambulatory Status: Ambulatory Pulse (bpm): 106 Discharge Destination: Home Respiratory Rate (breaths/min): 18 Transportation: Private Auto Blood Pressure (mmHg): 122/76 Accompanied By: alone Schedule Follow-up Appointment: Yes Clinical Summary of Care: Patient Declined Electronic Signature(s) Signed: 10/03/2020 8:28:44 AM By: Levan Hurst RN, BSN Entered By: Levan Hurst on 10/02/2020 08:48:59 -------------------------------------------------------------------------------- Lower Extremity Assessment Details Patient Name: Date of Service: [name], [name] 10/01/2020 3:45 PM Medical Record Number: [id number] Patient Account Number: [id number] Date of Birth/Sex: Treating RN: 1971-11-15 (49 y.o. [name] [name] Primary Care Contrell Ballentine: [name] Other Clinician: Referring Milliana Reddoch: Treating Chancie Lampert/Extender: Aurora Mask in Treatment: 6 Edema Assessment Assessed: [name]: No] [name]: No] Edema: [Left: Ye] [Right: s] Calf Left: Right: Point of Measurement: 50 cm From Medial Instep 39 cm Ankle Left: Right: Point of Measurement: 11 cm From Medial Instep 27 cm Vascular Assessment Pulses: Dorsalis Pedis Palpable: [Left:Yes] Electronic Signature(s) Signed: 10/03/2020 8:28:44 AM By: Levan Hurst RN, BSN Entered By: Levan Hurst on 10/01/2020 16:07:11 -------------------------------------------------------------------------------- Multi Wound Chart Details Patient Name: Date of Service: [name], [name] 10/01/2020 3:45 PM Medical Record Number: [id number] Patient Account Number: [id number] Date of Birth/Sex: Treating RN: December 11, 1970 (49 y.o. [name] [name]) [name] Primary Care Alonda Weaber: [name] Other Clinician: Referring Allycia Pitz: Treating Conal Shetley/Extender: Aurora Mask in Treatment: 6 Vital Signs Height(in): 10 Capillary Blood Glucose(mg/dl): 120 Weight(lbs):  232 Pulse(bpm): 106 Body Mass Index(BMI): 28 Blood Pressure(mmHg): 122/76 Temperature(F): 98.3 Respiratory Rate(breaths/min): 18 Photos: [2R:No Photos Left Metatarsal head first] [N/A:N/A N/A] Wound Location: [2R:Blister] [N/A:N/A] Wounding Event: [2R:Diabetic Wound/Ulcer of the Lower] [N/A:N/A] Primary Etiology: [2R:Extremity Anemia, Hypertension, Peripheral] [N/A:N/A] Comorbid History: [2R:Venous Disease, Type II Diabetes 06/23/2020] [N/A:N/A] Date Acquired: [2R:6] [N/A:N/A] Weeks of Treatment: [2R:Open] [N/A:N/A] Wound Status: [2R:Yes] [N/A:N/A] Wound Recurrence: [2R:2.5x2.5x0.1] [N/A:N/A] Measurements L x W x D (cm) [2R:4.909] [N/A:N/A] A (cm) : rea [2R:0.491] [N/A:N/A] Volume (cm) : [2R:-15735.50%] [N/A:N/A] % Reduction in A rea: [2R:-16266.70%] [N/A:N/A] % Reduction in Volume: [2R:Grade 2] [N/A:N/A] Classification: [2R:Medium] [  N/A:N/A] Exudate A mount: [2R:Serosanguineous] [N/A:N/A] Exudate Type: [2R:red, brown] [N/A:N/A] Exudate Color: [2R:Thickened] [N/A:N/A] Wound Margin: [2R:Large (67-100%)] [N/A:N/A] Granulation A mount: [2R:Pink] [N/A:N/A] Granulation Quality: [2R:None Present (0%)] [N/A:N/A] Necrotic A mount: [2R:Fat Layer (Subcutaneous Tissue): Yes N/A] Exposed Structures: [2R:Fascia: No Tendon: No Muscle: No Joint: No Bone: No None] [N/A:N/A] Epithelialization: [2R:Debridement - Excisional] [N/A:N/A] Debridement: Pre-procedure Verification/Time Out 17:05 [N/A:N/A] Taken: [2R:Lidocaine 5% topical ointment] [N/A:N/A] Pain Control: [2R:Callus, Subcutaneous] [N/A:N/A] Tissue Debrided: [2R:Skin/Subcutaneous Tissue] [N/A:N/A] Level: [2R:6.25] [N/A:N/A] Debridement A (sq cm): [2R:rea Blade, Forceps] [N/A:N/A] Instrument: [2R:Moderate] [N/A:N/A] Bleeding: [2R:Silver Nitrate] [N/A:N/A] Hemostasis A chieved: [2R:0] [N/A:N/A] Procedural Pain: [2R:0] [N/A:N/A] Post Procedural Pain: [2R:Procedure was tolerated well] [N/A:N/A] Debridement Treatment Response:  [2R:2.5x2.5x0.1] [N/A:N/A] Post Debridement Measurements L x W x D (cm) [2R:0.491] [N/A:N/A] Post Debridement Volume: (cm) [2R:Debridement] [N/A:N/A] Treatment Notes Wound #2R (Left Metatarsal head first) 1. Cleanse With Wound Cleanser 3. Primary Dressing Applied Calcium Alginate Ag 4. Secondary Dressing Dry Gauze Roll Gauze Foam 5. Secured With Tape Notes felt in shoe as well. foam donut as secondary. Electronic Signature(s) Signed: 10/02/2020 9:36:19 AM By: Linton Ham MD Signed: 10/02/2020 5:51:14 PM By: [name] RN Entered By: Linton Ham on 10/02/2020 09:25:54 -------------------------------------------------------------------------------- [place] Details Patient Name: Date of Service: [name], [name] 10/01/2020 3:45 PM Medical Record Number: [id number] Patient Account Number: [id number] Date of Birth/Sex: Treating RN: 05/20/1971 (49 y.o. [name] Primary Care Terrie Haring: [name] Other Clinician: Referring Sonja Manseau: Treating Tram Wrenn/Extender: Aurora Mask in Treatment: 6 Active Inactive Electronic Signature(s) Signed: 10/02/2020 5:51:14 PM By: [name] RN Entered By: [name] on 10/01/2020 15:36:42 -------------------------------------------------------------------------------- Pain Assessment Details Patient Name: Date of Service: [name], [name] 10/01/2020 3:45 PM Medical Record Number: [id number] Patient Account Number: [id number] Date of Birth/Sex: Treating RN: 07-Apr-1971 (49 y.o. [name] Primary Care Emaya Preston: [name] Other Clinician: Referring Bonny Egger: Treating Lamark Schue/Extender: Aurora Mask in Treatment: 6 Active Problems Location of Pain Severity and Description of Pain Patient Has Paino Yes Site Locations Rate the pain. Current Pain Level: 7 Pain Management and Medication Current Pain Management: Electronic  Signature(s) Signed: 10/01/2020 4:13:24 PM By: Sandre Kitty Signed: 10/02/2020 5:51:14 PM By: [name] RN Entered By: Sandre Kitty on 10/01/2020 15:57:09 -------------------------------------------------------------------------------- Patient/Caregiver Education Details Patient Name: Date of Service: [name] [name] 11/9/2021andnbsp3:45 PM Medical Record Number: [id number] Patient Account Number: [id number] Date of Birth/Gender: Treating RN: 18-Oct-1971 (49 y.o. [name] Primary Care Physician: [name] Other Clinician: Referring Physician: Treating Physician/Extender: Aurora Mask in Treatment: 6 Education Assessment Education Provided To: Patient Education Topics Provided Wound/Skin Impairment: Methods: Explain/Verbal Responses: State content correctly Electronic Signature(s) Signed: 10/02/2020 5:51:14 PM By: [name] RN Entered By: [name] on 10/01/2020 15:49:58 -------------------------------------------------------------------------------- Wound Assessment Details Patient Name: Date of Service: [name], [place] [name] 10/01/2020 3:45 PM Medical Record Number: [id number] Patient Account Number: [id number] Date of Birth/Sex: Treating RN: 07-Nov-1971 (49 y.o. 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 Etiology: Wound Location: Left Metatarsal head first Wound Status: Open Wounding Event: Blister Comorbid Anemia, Hypertension, Peripheral Venous Disease, Type II Date Acquired: 06/23/2020 History: Diabetes Weeks Of Treatment: 6 Clustered Wound: No Wound Measurements Length: (cm) 2.5 Width: (cm) 2.5 Depth: (cm) 0.1 Area: (cm) 4.909 Volume: (cm) 0.491 % Reduction in Area: -  15735.5% %  Reduction in Volume: -16266.7% Epithelialization: None Tunneling: No Undermining: No Wound Description Classification: Grade 2 Wound Margin: Thickened Exudate Amount: Medium Exudate Type: Serosanguineous Exudate Color: red, brown Foul Odor After Cleansing: No Slough/Fibrino No Wound Bed Granulation Amount: Large (67-100%) Exposed Structure Granulation Quality: Pink Fascia Exposed: No Necrotic Amount: None Present (0%) Fat Layer (Subcutaneous Tissue) Exposed: Yes Tendon Exposed: No Muscle Exposed: No Joint Exposed: No Bone Exposed: No Treatment Notes Wound #2R (Left Metatarsal head first) 1. Cleanse With Wound Cleanser 3. Primary Dressing Applied Calcium Alginate Ag 4. Secondary Dressing Dry Gauze Roll Gauze Foam 5. Secured With Tape Notes felt in shoe as well. foam donut as secondary. Electronic Signature(s) Signed: 10/02/2020 5:51:14 PM By: [name] RN Signed: 10/03/2020 8:28:44 AM By: Levan Hurst RN, BSN Entered By: Levan Hurst on 10/01/2020 16:07:28 -------------------------------------------------------------------------------- Vitals Details Patient Name: Date of Service: [place], [place] [name] 10/01/2020 3:45 PM Medical Record Number: [id number] Patient Account Number: [id number] Date of Birth/Sex: Treating RN: [birth year] (49 y.o. [name] [name]) Dolores Lory, Roff Primary Care Rajvir Ernster: [name] Other Clinician: Referring Chace Klippel: Treating Ngozi Alvidrez/Extender: Aurora Mask in Treatment: 6 Vital Signs Time Taken: 15:56 Temperature (F): 98.3 Height (in): 77 Pulse (bpm): 106 Weight (lbs): 232 Respiratory Rate (breaths/min): 18 Body Mass Index (BMI): 27.5 Blood Pressure (mmHg): 122/76 Capillary Blood Glucose (mg/dl): 120 Reference Range: 80 - 120 mg / dl Electronic Signature(s) Signed: 10/01/2020 4:13:24 PM By: Sandre Kitty Entered By: Sandre Kitty on 10/01/2020 15:57:02

## 2020-10-08 ENCOUNTER — Other Ambulatory Visit: Payer: Self-pay

## 2020-10-08 ENCOUNTER — Encounter (HOSPITAL_BASED_OUTPATIENT_CLINIC_OR_DEPARTMENT_OTHER): Payer: Medicare Other | Admitting: Internal Medicine

## 2020-10-08 DIAGNOSIS — E11621 Type 2 diabetes mellitus with foot ulcer: Secondary | ICD-10-CM | POA: Diagnosis not present

## 2020-10-08 NOTE — Progress Notes (Signed)
DEVEN, FURIA ([id number]) Visit Report for 10/08/2020 HPI Details Patient Name: Date of Service: [name] [name] [name] 10/08/2020 7:30 A M Medical Record Number: [id number] Patient Account Number: [id number] Date of Birth/Sex: Treating RN: 07/10/1971 (49 y.o. [name]) [name] Primary Care Provider: [name] Other Clinician: Referring Provider: Treating Provider/Extender: Aurora Mask in Treatment: 7 History of Present Illness HPI Description: 04/21/18 ADMISSION This is a 49 year old man who is a type II diabetic. In spite of fact his hemoglobin A1c is actually quite good 5.73 months ago he is at a really difficult time over the last 6-7 months. He developed a rapidly progressive infection in the right foot in November 2018 associated with osteomyelitis and necrotizing fasciitis. 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 The stump required and a revision on 11/24/17. The stump revision was advertised as being secondary to falls. I'm not sure the progressive history here however this area is actually closed over. The patient tells me over the same timeframe he has had wounds on the plantar aspect of his left foot. In the ED saw Dr. Sharol Given in April of this year. 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 It is really not clear that this patient is been dressing this with anything. He came in with the clinic without any specific dressing on the wound areas using his own tennis shoe. He tells me that he only ambulates of course to do a pivot transfer. He does not have his prosthesis for the right leg as of yet and he blames Medicaid for this. He does however use the foot to push himself along in his wheelchair at home. The patient has not had formal arterial studies. ABI in our clinic on the left was 1.13. Patient's past medical history includes type 2 diabetes, fracture of the right fibula. I see that he was treated for  abscesses on his right buttock and chest in 2016. I did not look at the microbiology of this. 04/28/18; patient comes back in the clinic today with the wound pretty much the same as when he came in here last week. Small opening lots of undermining relatively. He tells [place] that nothing is really been on this for 3 days in spite of the fact that we gave him enough to dress this easily especially such a small wound. He says he lives with his mother, she is not capable of assisting with this he is changing the dressings himself. 05/05/18; much better-looking wound today. Smaller. There is some undermining medially however that's only perhaps 2 mm. No surrounding erythema. We've been using silver collagen 05/12/18; small wound on the first metatarsal head. No undermining no surrounding erythema. We've been using silver collagen he has home health coming out to change 05/20/18 the wound on the first metatarsal head looks better. Covered in surface debris/callus nonviable tissue. Required debridement but post debridement this looks quite good. We've been using silver collagen. He has home health 05/27/18; first metatarsal head wound continues to improve. Just about completely closed. Still a lot of surface debridement callus. We've been using silver collagen 06/06/18; the first metatarsal wound is completely healed over. There is still a lot of callus. I gently removed some of this just to make sure that there was no open area and there is not. The patient mentions to me that his left leg has felt like "lead" for about a week READMISSION 08/16/2020 This is a 49 year old  man with type 2 diabetes. He returns to clinic today with a 1 month history of a blister and callus over the first metatarsal head. This is in exactly the same area as when he was here in 2019. 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 He has standard running shoes on the left foot to match the area in  his prosthesis. He has only been covering this area with a Band-Aid has not really been specifically dressing this. ABI in our clinic on the left was 1.16. This is essentially stable from the value in 2019 10/1 the patient has a linear wound over the left first metatarsal head. Again not a lot of callus thick skin around this that I removed with a #3 curette. This is not go deep to bone or muscle it does not look to be infected. 10/8 first metatarsal head. The wound looks like it is closing however this is going to be a difficult area to offload in the future. He has a size 15 shoe and he says his shoes are years old. The inserts in these current shoes are totally useless and I have advised him to replace these. He may need a new pair of shoes and I have he got original prosthesis at [facility] [place] I have asked him to go back there. 11/9; the patient has not been here in more than a month. Not sure he was healed when he was here the last time. I told him he would have to get new shoes and certainly offload the area over the left first metatarsal head. He has done nothing of the kind. He arrives back in clinic with the same old new balance sneakers. A very large separating callus over the first metatarsal head on the left 11/16; he has purchased new shoes and has at least 2 insoles like I asked. The wound is measuring much smaller. He is using silver alginate he changes the dressing himself Electronic Signature(s) Signed: 10/08/2020 4:54:15 PM By: Linton Ham MD Entered By: Linton Ham on 10/08/2020 08:11:50 -------------------------------------------------------------------------------- Physical Exam Details Patient Name: Date of Service: [name] [name], [name] L. 10/08/2020 7:30 A M Medical Record Number: [id number] Patient Account Number: [id number] Date of Birth/Sex: Treating RN: [birth year] (49 y.o. [name] Primary Care Provider: [name] Other Clinician: Referring  Provider: Treating Provider/Extender: Aurora Mask in Treatment: 7 Constitutional Patient is hypertensive.. Pulse regular and within target range for patient.Marland Kitchen Respirations regular, non-labored and within target range.. Temperature is normal and within the target range for the patient.Marland Kitchen Appears in no distress. Cardiovascular Pedal pulses are palpable.. Musculoskeletal I think he has some subluxation of the left first MTP. Notes Wound exam; much better looking wound surface today. Surface area is smaller. Even under illumination surface of the wound looks healthy. There is no evidence of surrounding infection Electronic Signature(s) Signed: 10/08/2020 4:54:15 PM By: Linton Ham MD Entered By: Linton Ham on 10/08/2020 08:13:45 -------------------------------------------------------------------------------- Physician Orders Details Patient Name: Date of Service: [name] [name], [name] L. 10/08/2020 7:30 A M Medical Record Number: [id number] Patient Account Number: [id number] Date of Birth/Sex: Treating RN: 07-10-1971 (49 y.o. [name] Primary Care Provider: [name] Other Clinician: Referring Provider: Treating Provider/Extender: Aurora Mask in Treatment: 7 Verbal / Phone Orders: No Diagnosis Coding ICD-10 Coding Code Description E11.621 Type 2 diabetes mellitus with foot ulcer L97.522 Non-pressure chronic ulcer of other part  of left foot with fat layer exposed E11.42 Type 2 diabetes mellitus with diabetic polyneuropathy Z89.511 Acquired absence of right leg below knee Follow-up Appointments Return Appointment in 2 weeks. Dressing Change Frequency Change Dressing every other day. Wound Cleansing May shower and wash wound with soap and water. - when dressing is changed Primary Wound Dressing Wound #2R Left Metatarsal head first Calcium Alginate with Silver Secondary Dressing Foam - foam  donut Kerlix/Rolled Gauze - secure with tape Dry Gauze - felt site and felt in shoe Off-Loading Other: - can wear shoe that you have at home Electronic Signature(s) Signed: 10/08/2020 4:54:15 PM By: Linton Ham MD Signed: 10/08/2020 5:00:30 PM By: [name] RN Entered By: [name] on 10/08/2020 08:09:46 -------------------------------------------------------------------------------- Problem List Details Patient Name: Date of Service: [name] [name], [name] 10/08/2020 7:30 A M Medical Record Number: [id number] Patient Account Number: [id number] Date of Birth/Sex: Treating RN: 1971/01/31 (49 y.o. [name] Primary Care Provider: [name] Other Clinician: Referring Provider: Treating Provider/Extender: Aurora Mask in Treatment: 7 Active Problems ICD-10 Encounter Code Description Active Date MDM Diagnosis E11.621 Type 2 diabetes mellitus with foot ulcer 08/16/2020 No Yes L97.522 Non-pressure chronic ulcer of other part of left foot with fat layer exposed 08/16/2020 No Yes E11.42 Type 2 diabetes mellitus with diabetic polyneuropathy 08/16/2020 No Yes Z89.511 Acquired absence of right leg below knee 08/16/2020 No Yes Inactive Problems Resolved Problems Electronic Signature(s) Signed: 10/08/2020 4:54:15 PM By: Linton Ham MD Entered By: Linton Ham on 10/08/2020 08:11:01 -------------------------------------------------------------------------------- Progress Note Details Patient Name: Date of Service: [name] [name], [name] 10/08/2020 7:30 A M Medical Record Number: [id number] Patient Account Number: [id number] Date of Birth/Sex: Treating RN: 22-Mar-1971 (49 y.o. [name] Primary Care Provider: [name] Other Clinician: Referring Provider: Treating Provider/Extender: Aurora Mask in Treatment: 7 Subjective History of Present Illness (HPI) 04/21/18 ADMISSION This is a 49 year old  man who is a type II diabetic. In spite of fact his hemoglobin A1c is actually quite good 5.73 months ago he is at a really difficult time over the last 6-7 months. He developed a rapidly progressive infection in the right foot in November 2018 associated with osteomyelitis and necrotizing fasciitis. 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 The stump required and a revision on 11/24/17. The stump revision was advertised as being secondary to falls. I'm not sure the progressive history here however this area is actually closed over. The patient tells me over the same timeframe he has had wounds on the plantar aspect of his left foot. In the ED saw Dr. Sharol Given in April of this year. 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 It is really not clear that this patient is been dressing this with anything. He came in with the clinic without any specific dressing on the wound areas using his own tennis shoe. He tells me that he only ambulates of course to do a pivot transfer. He does not have his prosthesis for the right leg as of yet and he blames Medicaid for this. He does however use the foot to push himself along in his wheelchair at home. The patient has not had formal arterial studies. ABI in our clinic on the left was 1.13. Patient's past medical history includes type 2 diabetes, fracture of the right fibula. I see that he was treated for abscesses on his right buttock and chest in 2016. I did not  look at the microbiology of this. 04/28/18; patient comes back in the clinic today with the wound pretty much the same as when he came in here last week. Small opening lots of undermining relatively. He tells [place] that nothing is really been on this for 3 days in spite of the fact that we gave him enough to dress this easily especially such a small wound. He says he lives with his mother, she is not capable of assisting with this he is changing the dressings  himself. 05/05/18; much better-looking wound today. Smaller. There is some undermining medially however that's only perhaps 2 mm. No surrounding erythema. We've been using silver collagen 05/12/18; small wound on the first metatarsal head. No undermining no surrounding erythema. We've been using silver collagen he has home health coming out to change 05/20/18 the wound on the first metatarsal head looks better. Covered in surface debris/callus nonviable tissue. Required debridement but post debridement this looks quite good. We've been using silver collagen. He has home health 05/27/18; first metatarsal head wound continues to improve. Just about completely closed. Still a lot of surface debridement callus. We've been using silver collagen 06/06/18; the first metatarsal wound is completely healed over. There is still a lot of callus. I gently removed some of this just to make sure that there was no open area and there is not. The patient mentions to me that his left leg has felt like "lead" for about a week READMISSION 08/16/2020 This is a 49 year old man with type 2 diabetes. He returns to clinic today with a 1 month history of a blister and callus over the first metatarsal head. This is in exactly the same area as when he was here in 2019. 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 He has standard running shoes on the left foot to match the area in his prosthesis. He has only been covering this area with a Band-Aid has not really been specifically dressing this. ABI in our clinic on the left was 1.16. This is essentially stable from the value in 2019 10/1 the patient has a linear wound over the left first metatarsal head. Again not a lot of callus thick skin around this that I removed with a #3 curette. This is not go deep to bone or muscle it does not look to be infected. 10/8 first metatarsal head. The wound looks like it is closing however this is going  to be a difficult area to offload in the future. He has a size 15 shoe and he says his shoes are years old. The inserts in these current shoes are totally useless and I have advised him to replace these. He may need a new pair of shoes and I have he got original prosthesis at [employer] [place] [facility] [place] I have asked him to go back there. 11/9; the patient has not been here in more than a month. Not sure he was healed when he was here the last time. I told him he would have to get new shoes and certainly offload the area over the left first metatarsal head. He has done nothing of the kind. He arrives back in clinic with the same old new balance sneakers. A very large separating callus over the first metatarsal head on the left 11/16; he has purchased new shoes and has at least 2 insoles like I asked. The wound is measuring much smaller. He is using silver alginate he  changes the dressing himself Objective Constitutional Patient is hypertensive.. Pulse regular and within target range for patient.Marland Kitchen Respirations regular, non-labored and within target range.. Temperature is normal and within the target range for the patient.Marland Kitchen Appears in no distress. Vitals Time Taken: 7:41 AM, Height: 77 in, Weight: 232 lbs, BMI: 27.5, Temperature: 98.1 F, Pulse: 93 bpm, Respiratory Rate: 18 breaths/min, Blood Pressure: 153/92 mmHg, Capillary Blood Glucose: 197 mg/dl. Cardiovascular Pedal pulses are palpable.. Musculoskeletal I think he has some subluxation of the left first MTP. General Notes: Wound exam; much better looking wound surface today. Surface area is smaller. Even under illumination surface of the wound looks healthy. There is no evidence of surrounding infection Integumentary (Hair, Skin) Wound #2R status is Open. Original cause of wound was Blister. The wound is located on the Left Metatarsal head first. The wound measures 1.3cm length x 0.8cm width x 0.1cm depth; 0.817cm^2 area and 0.082cm^3 volume. There  is Fat Layer (Subcutaneous Tissue) exposed. There is no undermining noted. There is a medium amount of serosanguineous drainage noted. The wound margin is thickened. There is large (67-100%) pink granulation within the wound bed. There is a small (1-33%) amount of necrotic tissue within the wound bed including Adherent Slough. Assessment Active Problems ICD-10 Type 2 diabetes mellitus with foot ulcer Non-pressure chronic ulcer of other part of left foot with fat layer exposed Type 2 diabetes mellitus with diabetic polyneuropathy Acquired absence of right leg below knee Plan Follow-up Appointments: Return Appointment in 2 weeks. Dressing Change Frequency: Change Dressing every other day. Wound Cleansing: May shower and wash wound with soap and water. - when dressing is changed Primary Wound Dressing: Wound #2R Left Metatarsal head first: Calcium Alginate with Silver Secondary Dressing: Foam - foam donut Kerlix/Rolled Gauze - secure with tape Dry Gauze - felt site and felt in shoe Off-Loading: Other: - can wear shoe that you have at home #1 in view of the improvement this week I continued with the silver alginate. 2. Surface area is better no debridement is required 3. He has purchased new shoes which is also a big step in the right direction. He has padded this with 2 additional insoles 4. We will follow up with [place] in 2 weeks Electronic Signature(s) Signed: 10/08/2020 4:54:15 PM By: Linton Ham MD Entered By: Linton Ham on 10/08/2020 08:14:37 -------------------------------------------------------------------------------- SuperBill Details Patient Name: Date of Service: [name] [name], [name] Camp L. 10/08/2020 Medical Record Number: [id number] Patient Account Number: [id number] Date of Birth/Sex: Treating RN: July 31, 1971 (49 y.o. Staci Acosta, Morey Hummingbird Primary Care Provider: [name] Other Clinician: Referring Provider: Treating Provider/Extender: Aurora Mask in Treatment: 7 Diagnosis Coding ICD-10 Codes Code Description E11.621 Type 2 diabetes mellitus with foot ulcer L97.522 Non-pressure chronic ulcer of other part of left foot with fat layer exposed E11.42 Type 2 diabetes mellitus with diabetic polyneuropathy Z89.511 Acquired absence of right leg below knee Facility Procedures CPT4 Code: [id number] Description: 99213 - WOUND CARE VISIT-LEV 3 EST PT Modifier: Quantity: 1 Physician Procedures : CPT4 Code Description Modifier [id number] - WC PHYS LEVEL 3 - EST PT ICD-10 Diagnosis Description E11.621 Type 2 diabetes mellitus with foot ulcer L97.522 Non-pressure chronic ulcer of other part of left foot with fat layer exposed E11.42 Type 2  diabetes mellitus with diabetic polyneuropathy Quantity: 1 Electronic Signature(s) Signed: 10/08/2020 4:54:15 PM By: Linton Ham MD Entered By: Linton Ham on 10/08/2020 08:14:52

## 2020-10-08 NOTE — Progress Notes (Signed)
[name], [name] ([id number]) Visit Report for 10/08/2020 Arrival Information Details Patient Name: Date of Service: [name] [name] 10/08/2020 7:30 A M Medical Record Number: [id number] Patient Account Number: [id number] Date of Birth/Sex: Treating RN: 1971/03/31 (49 y.o. [name], [name] Primary Care Almir Botts: [name] Other Clinician: Referring Betsy Rosello: Treating Kashus Karlen/Extender: Aurora Mask in Treatment: 7 Visit Information History Since Last Visit Added or deleted any medications: No Patient Arrived: Ambulatory Any new allergies or adverse reactions: No Arrival Time: 07:41 Had a fall or experienced change in No Accompanied By: self activities of daily living that may affect Transfer Assistance: None risk of falls: Patient Identification Verified: Yes Signs or symptoms of abuse/neglect since last visito No Secondary Verification Process Completed: Yes Hospitalized since last visit: No Patient Requires Transmission-Based Precautions: No Implantable device outside of the clinic excluding No Patient Has Alerts: No cellular tissue based products placed in the center since last visit: Has Dressing in Place as Prescribed: No Pain Present Now: No Electronic Signature(s) Signed: 10/08/2020 12:08:35 PM By: Deon Pilling Entered By: Deon Pilling on 10/08/2020 07:49:41 -------------------------------------------------------------------------------- Clinic Level of Care Assessment Details Patient Name: Date of Service: [name] NDGreggory Brandy [name] 10/08/2020 7:30 A M Medical Record Number: [id number] Patient Account Number: [id number] Date of Birth/Sex: Treating RN: June 04, 1971 (49 y.o. [name]) [name] Primary Care Hazelynn Mckenny: [name] Other Clinician: Referring Deyna Carbon: Treating Vicci Reder/Extender: Aurora Mask in Treatment: 7 Clinic Level of Care Assessment Items TOOL 4 Quantity Score X- 1 0 Use when only  an EandM is performed on FOLLOW-UP visit ASSESSMENTS - Nursing Assessment / Reassessment X- 1 10 Reassessment of Co-morbidities (includes updates in patient status) X- 1 5 Reassessment of Adherence to Treatment Plan ASSESSMENTS - Wound and Skin A ssessment / Reassessment X - Simple Wound Assessment / Reassessment - one wound 1 5 []  - 0 Complex Wound Assessment / Reassessment - multiple wounds []  - 0 Dermatologic / Skin Assessment (not related to wound area) ASSESSMENTS - Focused Assessment []  - 0 Circumferential Edema Measurements - multi extremities []  - 0 Nutritional Assessment / Counseling / Intervention []  - 0 Lower Extremity Assessment (monofilament, tuning fork, pulses) []  - 0 Peripheral Arterial Disease Assessment (using hand held doppler) ASSESSMENTS - Ostomy and/or Continence Assessment and Care []  - 0 Incontinence Assessment and Management []  - 0 Ostomy Care Assessment and Management (repouching, etc.) PROCESS - Coordination of Care X - Simple Patient / Family Education for ongoing care 1 15 []  - 0 Complex (extensive) Patient / Family Education for ongoing care X- 1 10 Staff obtains Consents, Records, T Results / Process Orders est []  - 0 Staff telephones HHA, Nursing Homes / Clarify orders / etc []  - 0 Routine Transfer to another Facility (non-emergent condition) []  - 0 Routine Hospital Admission (non-emergent condition) []  - 0 New Admissions / Biomedical engineer / Ordering NPWT Apligraf, etc. , []  - 0 Emergency Hospital Admission (emergent condition) X- 1 10 Simple Discharge Coordination []  - 0 Complex (extensive) Discharge Coordination PROCESS - Special Needs []  - 0 Pediatric / Minor Patient Management []  - 0 Isolation Patient Management []  - 0 Hearing / Language / Visual special needs []  - 0 Assessment of Community assistance (transportation, D/C planning, etc.) []  - 0 Additional assistance / Altered mentation []  - 0 Support Surface(s)  Assessment (bed, cushion, seat, etc.) INTERVENTIONS - Wound Cleansing / Measurement X - Simple Wound Cleansing - one wound 1 5 []  - 0 Complex Wound Cleansing -  multiple wounds []  - 0 Wound Imaging (photographs - any number of wounds) X- 1 5 Wound Tracing (instead of photographs) X- 1 5 Simple Wound Measurement - one wound []  - 0 Complex Wound Measurement - multiple wounds INTERVENTIONS - Wound Dressings []  - 0 Small Wound Dressing one or multiple wounds X- 1 15 Medium Wound Dressing one or multiple wounds []  - 0 Large Wound Dressing one or multiple wounds X- 1 5 Application of Medications - topical []  - 0 Application of Medications - injection INTERVENTIONS - Miscellaneous []  - 0 External ear exam []  - 0 Specimen Collection (cultures, biopsies, blood, body fluids, etc.) []  - 0 Specimen(s) / Culture(s) sent or taken to Lab for analysis []  - 0 Patient Transfer (multiple staff / Civil Service fast streamer / Similar devices) []  - 0 Simple Staple / Suture removal (25 or less) []  - 0 Complex Staple / Suture removal (26 or more) []  - 0 Hypo / Hyperglycemic Management (close monitor of Blood Glucose) []  - 0 Ankle / Brachial Index (ABI) - do not check if billed separately X- 1 5 Vital Signs Has the patient been seen at the hospital within the last three years: Yes Total Score: 95 Level Of Care: New/Established - Level 3 Electronic Signature(s) Signed: 10/08/2020 5:00:30 PM By: [name] RN Entered By: [name] on 10/08/2020 08:11:00 -------------------------------------------------------------------------------- Encounter Discharge Information Details Patient Name: Date of Service: [name] [name], [name] 10/08/2020 7:30 A M Medical Record Number: [id number] Patient Account Number: [id number] Date of Birth/Sex: Treating RN: [birth year] (49 y.o. [name] [name] Primary Care Deborahann Poteat: [name] Other Clinician: Referring Itzae Miralles: Treating Shenice Dolder/Extender: Aurora Mask in Treatment: 7 Encounter Discharge Information Items Discharge Condition: Stable Ambulatory Status: Ambulatory Discharge Destination: Home Transportation: Private Auto Accompanied By: self Schedule Follow-up Appointment: Yes Clinical Summary of Care: Electronic Signature(s) Signed: 10/08/2020 12:08:35 PM By: Deon Pilling Entered By: Deon Pilling on 10/08/2020 08:26:58 -------------------------------------------------------------------------------- Lower Extremity Assessment Details Patient Name: Date of Service: STRICKLA [name], [name] 10/08/2020 7:30 A M Medical Record Number: [id number] Patient Account Number: [id number] Date of Birth/Sex: Treating RN: 10-30-1971 (49 y.o. [name] [name] Primary Care Kevyn Boquet: [name] Other Clinician: Referring Darbi Chandran: Treating Sahar Ryback/Extender: Aurora Mask in Treatment: 7 Edema Assessment Assessed: [name]: Yes] [name]: No] Edema: [Left: N] [Right: o] Calf Left: Right: Point of Measurement: 50 cm From Medial Instep 39 cm Ankle Left: Right: Point of Measurement: 11 cm From Medial Instep 25.5 cm Vascular Assessment Pulses: Dorsalis Pedis Palpable: [Left:Yes] Electronic Signature(s) Signed: 10/08/2020 12:08:35 PM By: Deon Pilling Entered By: Deon Pilling on 10/08/2020 07:50:39 -------------------------------------------------------------------------------- Multi Wound Chart Details Patient Name: Date of Service: [name] [name], [name] 10/08/2020 7:30 A M Medical Record Number: [id number] Patient Account Number: [id number] Date of Birth/Sex: Treating RN: 23-Jan-1971 (49 y.o. [name]) [name] Primary Care Gilverto Dileonardo: [name] Other Clinician: Referring Rayn Enderson: Treating Nilo Fallin/Extender: Aurora Mask in Treatment: 7 Vital Signs Height(in): 77 Capillary Blood Glucose(mg/dl): 197 Weight(lbs): 232 Pulse(bpm): 32 Body Mass  Index(BMI): 28 Blood Pressure(mmHg): 153/92 Temperature(F): 98.1 Respiratory Rate(breaths/min): 18 Photos: [2R:No Photos Left Metatarsal head first] [N/A:N/A N/A] Wound Location: [2R:Blister] [N/A:N/A] Wounding Event: [2R:Diabetic Wound/Ulcer of the Lower] [N/A:N/A] Primary Etiology: [2R:Extremity Anemia, Hypertension, Peripheral] [N/A:N/A] Comorbid History: [2R:Venous Disease, Type II Diabetes 06/23/2020] [N/A:N/A] Date Acquired: [2R:7] [N/A:N/A] Weeks of Treatment: [2R:Open] [N/A:N/A] Wound Status: [2R:Yes] [N/A:N/A] Wound Recurrence: [2R:1.3x0.8x0.1] [N/A:N/A] Measurements L x W x D (cm) [0H:4.742] [N/A:N/A] A (cm) : rea [2R:0.082] [N/A:N/A]  Volume (cm) : [2R:-2535.50%] [N/A:N/A] % Reduction in A rea: [2R:-2633.30%] [N/A:N/A] % Reduction in Volume: [2R:Grade 2] [N/A:N/A] Classification: [2R:Medium] [N/A:N/A] Exudate A mount: [2R:Serosanguineous] [N/A:N/A] Exudate Type: [2R:red, brown] [N/A:N/A] Exudate Color: [2R:Thickened] [N/A:N/A] Wound Margin: [2R:Large (67-100%)] [N/A:N/A] Granulation A mount: [2R:Pink] [N/A:N/A] Granulation Quality: [2R:Small (1-33%)] [N/A:N/A] Necrotic A mount: [2R:Fat Layer (Subcutaneous Tissue): Yes N/A] Exposed Structures: [2R:Fascia: No Tendon: No Muscle: No Joint: No Bone: No Medium (34-66%)] [N/A:N/A] Treatment Notes Electronic Signature(s) Signed: 10/08/2020 4:54:15 PM By: Linton Ham MD Signed: 10/08/2020 5:00:30 PM By: [name] RN Entered By: Linton Ham on 10/08/2020 08:11:08 -------------------------------------------------------------------------------- Multi-Disciplinary Care Plan Details Patient Name: Date of Service: [name] [name], [name] 10/08/2020 7:30 A M Medical Record Number: [id number] Patient Account Number: [id number] Date of Birth/Sex: Treating RN: [birth year] (49 y.o. [name] Primary Care [name]: [name] Other Clinician: Referring Mahkayla Preece: Treating Briyanna Billingham/Extender: Aurora Mask in Treatment: 7 Active Inactive Wound/Skin Impairment Nursing Diagnoses: Impaired tissue integrity Goals: Ulcer/skin breakdown will have a volume reduction of 30% by week 4 Date Initiated: 08/16/2020 Target Resolution Date: 10/31/2020 Goal Status: Active Interventions: Provide education on ulcer and skin care Notes: Electronic Signature(s) Signed: 10/08/2020 5:00:30 PM By: [name] RN Entered By: [name] on 10/08/2020 07:58:39 -------------------------------------------------------------------------------- Pain Assessment Details Patient Name: Date of Service: STRICKLA [name], [name] 10/08/2020 7:30 A M Medical Record Number: [id number] Patient Account Number: [id number] Date of Birth/Sex: Treating RN: [birth year] (49 y.o. [name] [name] Primary Care Kaesen Rodriguez: [name] Other Clinician: Referring Samarion Ehle: Treating Kareem Cathey/Extender: Aurora Mask in Treatment: 7 Active Problems Location of Pain Severity and Description of Pain Patient Has Paino No Site Locations Rate the pain. Rate the pain. Current Pain Level: 0 Pain Management and Medication Current Pain Management: Medication: No Cold Application: No Rest: No Massage: No Activity: No T.E.N.S.: No Heat Application: No Leg drop or elevation: No Is the Current Pain Management Adequate: Adequate How does your wound impact your activities of daily livingo Sleep: No Bathing: No Appetite: No Relationship With Others: No Bladder Continence: No Emotions: No Bowel Continence: No Work: No Toileting: No Drive: No Dressing: No Hobbies: No Electronic Signature(s) Signed: 10/08/2020 12:08:35 PM By: Deon Pilling Entered By: Deon Pilling on 10/08/2020 07:50:28 -------------------------------------------------------------------------------- Patient/Caregiver Education Details Patient Name: Date of Service: [name] [name]  11/16/2021andnbsp7:30 A M Medical Record Number: [id number] Patient Account Number: [id number] Date of Birth/Gender: Treating RN: 1971/05/29 (49 y.o. [name] Primary Care Physician: [name] Other Clinician: Referring Physician: Treating Physician/Extender: Aurora Mask in Treatment: 7 Education Assessment Education Provided To: Patient Education Topics Provided Wound/Skin Impairment: Methods: Explain/Verbal Responses: State content correctly Electronic Signature(s) Signed: 10/08/2020 5:00:30 PM By: [name] RN Entered By: [name] on 10/08/2020 07:55:29 -------------------------------------------------------------------------------- Wound Assessment Details Patient Name: Date of Service: [name] [name], [name] 10/08/2020 7:30 A M Medical Record Number: [id number] Patient Account Number: [id number] Date of Birth/Sex: Treating RN: [birth year] (49 y.o. [name], [name] Primary Care Verne Cove: [name] Other Clinician: Referring Ludy Messamore: Treating Makahla Kiser/Extender: Aurora Mask in Treatment: 7 Wound Status Wound Number: 2R Primary Diabetic Wound/Ulcer of the Lower Extremity Etiology: Wound Location: Left Metatarsal head first Wound Status: Open Wounding Event: Blister Comorbid Anemia, Hypertension, Peripheral Venous Disease, Type II Date Acquired: 06/23/2020 History: Diabetes Weeks Of Treatment: 7 Clustered Wound: No Wound Measurements Length: (cm) 1.3 Width: (cm) 0.8 Depth: (cm) 0.1 Area: (cm) 0.817 Volume: (cm) 0.082 % Reduction in  Area: -2535.5% % Reduction in Volume: -2633.3% Epithelialization: Medium (34-66%) Undermining: No Wound Description Classification: Grade 2 Wound Margin: Thickened Exudate Amount: Medium Exudate Type: Serosanguineous Exudate Color: red, brown Foul Odor After Cleansing: No Slough/Fibrino Yes Wound Bed Granulation Amount: Large (67-100%) Exposed  Structure Granulation Quality: Pink Fascia Exposed: No Necrotic Amount: Small (1-33%) Fat Layer (Subcutaneous Tissue) Exposed: Yes Necrotic Quality: Adherent Slough Tendon Exposed: No Muscle Exposed: No Joint Exposed: No Bone Exposed: No Treatment Notes Wound #2R (Left Metatarsal head first) 1. Cleanse With Wound Cleanser 3. Primary Dressing Applied Calcium Alginate Ag 4. Secondary Dressing Dry Gauze Roll Gauze Foam 5. Secured With Medipore tape Notes foam donut as secondary. patient wearing new shoes with extra insoles. Electronic Signature(s) Signed: 10/08/2020 12:08:35 PM By: Deon Pilling Entered By: Deon Pilling on 10/08/2020 07:51:04 -------------------------------------------------------------------------------- Vitals Details Patient Name: Date of Service: [name] [name], [name] 10/08/2020 7:30 A M Medical Record Number: [id number] Patient Account Number: [id number] Date of Birth/Sex: Treating RN: [birth year] (49 y.o. [name] [name] Primary Care Zonya Gudger: [name] Other Clinician: Referring Caroline Longie: Treating Kervin Bones/Extender: Aurora Mask in Treatment: 7 Vital Signs Time Taken: 07:41 Temperature (F): 98.1 Height (in): 77 Pulse (bpm): 93 Weight (lbs): 232 Respiratory Rate (breaths/min): 18 Body Mass Index (BMI): 27.5 Blood Pressure (mmHg): 153/92 Capillary Blood Glucose (mg/dl): 197 Reference Range: 80 - 120 mg / dl Electronic Signature(s) Signed: 10/08/2020 12:08:35 PM By: Deon Pilling Entered By: Deon Pilling on 10/08/2020 07:50:19

## 2020-10-22 ENCOUNTER — Other Ambulatory Visit: Payer: Self-pay

## 2020-10-22 ENCOUNTER — Encounter (HOSPITAL_BASED_OUTPATIENT_CLINIC_OR_DEPARTMENT_OTHER): Payer: Medicare Other | Admitting: Internal Medicine

## 2020-10-22 NOTE — Progress Notes (Signed)
[name], [name] ([id number]) Visit Report for 10/22/2020 HPI Details Patient Name: Date of Service: [name] [name] 10/22/2020 1:00 PM Medical Record Number: [id number] Patient Account Number: [id number] Date of Birth/Sex: Treating RN: Jun 28, 1971 (49 y.o. [name] [name]) [name] Primary Care Provider: [name] Other Clinician: Referring Provider: Treating Provider/Extender: Aurora Mask in Treatment: 9 History of Present Illness HPI Description: 04/21/18 ADMISSION This is a 49 year old man who is a type II diabetic. In spite of fact his hemoglobin A1c is actually quite good 5.73 months ago he is at a really difficult time over the last 6-7 months. He developed a rapidly progressive infection in the right foot in November 2018 associated with osteomyelitis and necrotizing fasciitis. 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 The stump required and a revision on 11/24/17. The stump revision was advertised as being secondary to falls. I'm not sure the progressive history here however this area is actually closed over. The patient tells me over the same timeframe he has had wounds on the plantar aspect of his left foot. In the ED saw Dr. Sharol Given in April of this year. 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 It is really not clear that this patient is been dressing this with anything. He came in with the clinic without any specific dressing on the wound areas using his own tennis shoe. He tells me that he only ambulates of course to do a pivot transfer. He does not have his prosthesis for the right leg as of yet and he blames Medicaid for this. He does however use the foot to push himself along in his wheelchair at home. The patient has not had formal arterial studies. ABI in our clinic on the left was 1.13. Patient's past medical history includes type 2 diabetes, fracture of the right fibula. I see that he was treated for  abscesses on his right buttock and chest in 2016. I did not look at the microbiology of this. 04/28/18; patient comes back in the clinic today with the wound pretty much the same as when he came in here last week. Small opening lots of undermining relatively. He tells [place] that nothing is really been on this for 3 days in spite of the fact that we gave him enough to dress this easily especially such a small wound. He says he lives with his mother, she is not capable of assisting with this he is changing the dressings himself. 05/05/18; much better-looking wound today. Smaller. There is some undermining medially however that's only perhaps 2 mm. No surrounding erythema. We've been using silver collagen 05/12/18; small wound on the first metatarsal head. No undermining no surrounding erythema. We've been using silver collagen he has home health coming out to change 05/20/18 the wound on the first metatarsal head looks better. Covered in surface debris/callus nonviable tissue. Required debridement but post debridement this looks quite good. We've been using silver collagen. He has home health 05/27/18; first metatarsal head wound continues to improve. Just about completely closed. Still a lot of surface debridement callus. We've been using silver collagen 06/06/18; the first metatarsal wound is completely healed over. There is still a lot of callus. I gently removed some of this just to make sure that there was no open area and there is not. The patient mentions to me that his left leg has felt like "lead" for about a week READMISSION 08/16/2020 This is a 49 year old man  with type 2 diabetes. He returns to clinic today with a 1 month history of a blister and callus over the first metatarsal head. This is in exactly the same area as when he was here in 2019. 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 He has standard running shoes on the left foot to match the area in  his prosthesis. He has only been covering this area with a Band-Aid has not really been specifically dressing this. ABI in our clinic on the left was 1.16. This is essentially stable from the value in 2019 10/1 the patient has a linear wound over the left first metatarsal head. Again not a lot of callus thick skin around this that I removed with a #3 curette. This is not go deep to bone or muscle it does not look to be infected. 10/8 first metatarsal head. The wound looks like it is closing however this is going to be a difficult area to offload in the future. He has a size 15 shoe and he says his shoes are years old. The inserts in these current shoes are totally useless and I have advised him to replace these. He may need a new pair of shoes and I have he got original prosthesis [facility] [place] I have asked him to go back there. 11/9; the patient has not been here in more than a month. Not sure he was healed when he was here the last time. I told him he would have to get new shoes and certainly offload the area over the left first metatarsal head. He has done nothing of the kind. He arrives back in clinic with the same old new balance sneakers. A very large separating callus over the first metatarsal head on the left 11/16; he has purchased new shoes and has at least 2 insoles like I asked. The wound is measuring much smaller. He is using silver alginate he changes the dressing himself 11/30; wound is measurably smaller in length of about a half a centimeter. This is generally very little depth. We have been using silver alginate. He changes the dressing himself every second day. Electronic Signature(s) Signed: 10/22/2020 5:54:56 PM By: [name] Ham MD Entered By: [name] [name] on 10/22/2020 13:49:01 -------------------------------------------------------------------------------- Physical Exam Details Patient Name: Date of Service: [name], [name] 10/22/2020 1:00  PM Medical Record Number: [id number] Patient Account Number: [id number] Date of Birth/Sex: Treating RN: 10/24/1971 (49 y.o. [name] Primary Care Provider: [name] Other Clinician: Referring Provider: Treating Provider/Extender: Aurora Mask in Treatment: 9 Constitutional Sitting or standing Blood Pressure is within target range for patient.. Pulse regular and within target range for patient.Marland Kitchen Respirations regular, non-labored and within target range.. Temperature is normal and within the target range for the patient.Marland Kitchen Appears in no distress. Cardiovascular Pedal pulses are palpable on the left. Integumentary (Hair, Skin) No evidence of surrounding infection. Notes Wound exam; left plantar first metatarsal head wound is smaller. The surface of the wound did not look completely healthy somewhat pale looking however in view of the continued improvement in surface area no debridement was necessary. No evidence of surrounding infection Electronic Signature(s) Signed: 10/22/2020 5:54:56 PM By: [name] Ham MD Entered By: [name] [name] on 10/22/2020 13:50:30 -------------------------------------------------------------------------------- Physician Orders Details Patient Name: Date of Service: [name], [place] L. 10/22/2020 1:00 PM Medical Record Number: [id number] Patient Account Number: [id number] Date of Birth/Sex: Treating RN:  11-19-1971 (49 y.o. Staci Acosta, Morey Hummingbird Primary Care Provider: [name] Other Clinician: Referring Provider: Treating Provider/Extender: Aurora Mask in Treatment: 9 Verbal / Phone Orders: No Diagnosis Coding ICD-10 Coding Code Description E11.621 Type 2 diabetes mellitus with foot ulcer L97.522 Non-pressure chronic ulcer of other part of left foot with fat layer exposed E11.42 Type 2 diabetes mellitus with diabetic polyneuropathy Z89.511 Acquired absence of right leg below  knee Follow-up Appointments Return Appointment in 2 weeks. Dressing Change Frequency Change Dressing every other day. Wound Cleansing May shower and wash wound with soap and water. - when dressing is changed Primary Wound Dressing Wound #2R Left Metatarsal head first Calcium Alginate with Silver Secondary Dressing Foam - foam donut Kerlix/Rolled Gauze - secure with tape Dry Gauze - felt site and felt in shoe Off-Loading Other: - can wear shoe that you have at home Electronic Signature(s) Signed: 10/22/2020 5:54:56 PM By: [name] Ham MD Signed: 10/22/2020 5:56:00 PM By: [name] RN Entered By: [name] on 10/22/2020 13:12:55 -------------------------------------------------------------------------------- Problem List Details Patient Name: Date of Service: [name], [name] 10/22/2020 1:00 PM Medical Record Number: [id number] Patient Account Number: [id number] Date of Birth/Sex: Treating RN: [birth year] (49 y.o. [name] Primary Care Provider: [name] Other Clinician: Referring Provider: Treating Provider/Extender: Aurora Mask in Treatment: 9 Active Problems ICD-10 Encounter Code Description Active Date MDM Diagnosis E11.621 Type 2 diabetes mellitus with foot ulcer 08/16/2020 No Yes L97.522 Non-pressure chronic ulcer of other part of left foot with fat layer exposed 08/16/2020 No Yes E11.42 Type 2 diabetes mellitus with diabetic polyneuropathy 08/16/2020 No Yes Z89.511 Acquired absence of right leg below knee 08/16/2020 No Yes Inactive Problems Resolved Problems Electronic Signature(s) Signed: 10/22/2020 5:54:56 PM By: [name] Ham MD Entered By: [name] [name] on 10/22/2020 13:45:35 -------------------------------------------------------------------------------- Progress Note Details Patient Name: Date of Service: [name], [name] 10/22/2020 1:00 PM Medical Record Number: [id number] Patient Account  Number: [id number] Date of Birth/Sex: Treating RN: Jan 15, 1971 (49 y.o. [name] Primary Care Provider: [name] Other Clinician: Referring Provider: Treating Provider/Extender: Aurora Mask in Treatment: 9 Subjective History of Present Illness (HPI) 04/21/18 ADMISSION This is a 49 year old man who is a type II diabetic. In spite of fact his hemoglobin A1c is actually quite good 5.73 months ago he is at a really difficult time over the last 6-7 months. He developed a rapidly progressive infection in the right foot in November 2018 associated with osteomyelitis and necrotizing fasciitis. 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 The stump required and a revision on 11/24/17. The stump revision was advertised as being secondary to falls. I'm not sure the progressive history here however this area is actually closed over. The patient tells me over the same timeframe he has had wounds on the plantar aspect of his left foot. In the ED saw Dr. Sharol Given in April of this year. 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 It is really not clear that this patient is been dressing this with anything. He came in with the clinic without any specific dressing on the wound areas using his own tennis shoe. He tells me that he only ambulates of course to do a pivot transfer. He does not have his prosthesis for the right leg as of yet and he blames Medicaid for this. He does however use the foot to push himself along in his wheelchair at home.  The patient has not had formal arterial studies. ABI in our clinic on the left was 1.13. Patient's past medical history includes type 2 diabetes, fracture of the right fibula. I see that he was treated for abscesses on his right buttock and chest in 2016. I did not look at the microbiology of this. 04/28/18; patient comes back in the clinic today with the wound pretty much the same as when he came in  here last week. Small opening lots of undermining relatively. He tells [place] that nothing is really been on this for 3 days in spite of the fact that we gave him enough to dress this easily especially such a small wound. He says he lives with his mother, she is not capable of assisting with this he is changing the dressings himself. 05/05/18; much better-looking wound today. Smaller. There is some undermining medially however that's only perhaps 2 mm. No surrounding erythema. We've been using silver collagen 05/12/18; small wound on the first metatarsal head. No undermining no surrounding erythema. We've been using silver collagen he has home health coming out to change 05/20/18 the wound on the first metatarsal head looks better. Covered in surface debris/callus nonviable tissue. Required debridement but post debridement this looks quite good. We've been using silver collagen. He has home health 05/27/18; first metatarsal head wound continues to improve. Just about completely closed. Still a lot of surface debridement callus. We've been using silver collagen 06/06/18; the first metatarsal wound is completely healed over. There is still a lot of callus. I gently removed some of this just to make sure that there was no open area and there is not. The patient mentions to me that his left leg has felt like "lead" for about a week READMISSION 08/16/2020 This is a 49 year old man with type 2 diabetes. He returns to clinic today with a 1 month history of a blister and callus over the first metatarsal head. This is in exactly the same area as when he was here in 2019. 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 He has standard running shoes on the left foot to match the area in his prosthesis. He has only been covering this area with a Band-Aid has not really been specifically dressing this. ABI in our clinic on the left was 1.16. This is essentially stable from the  value in 2019 10/1 the patient has a linear wound over the left first metatarsal head. Again not a lot of callus thick skin around this that I removed with a #3 curette. This is not go deep to bone or muscle it does not look to be infected. 10/8 first metatarsal head. The wound looks like it is closing however this is going to be a difficult area to offload in the future. He has a size 15 shoe and he says his shoes are years old. The inserts in these current shoes are totally useless and I have advised him to replace these. He may need a new pair of shoes and I have he got original prosthesis at [employer] [place] I have asked him to go back there. 11/9; the patient has not been here in more than a month. Not sure he was healed when he was here the last time. I told him he would have to get new shoes and certainly offload the area over the left first metatarsal head. He has done nothing of the kind. He arrives back  in clinic with the same old new balance sneakers. A very large separating callus over the first metatarsal head on the left 11/16; he has purchased new shoes and has at least 2 insoles like I asked. The wound is measuring much smaller. He is using silver alginate he changes the dressing himself 11/30; wound is measurably smaller in length of about a half a centimeter. This is generally very little depth. We have been using silver alginate. He changes the dressing himself every second day. Objective Constitutional Sitting or standing Blood Pressure is within target range for patient.. Pulse regular and within target range for patient.Marland Kitchen Respirations regular, non-labored and within target range.. Temperature is normal and within the target range for the patient.Marland Kitchen Appears in no distress. Vitals Time Taken: 1:08 PM, Height: 77 in, Weight: 232 lbs, BMI: 27.5, Temperature: 98.6 F, Pulse: 97 bpm, Respiratory Rate: 18 breaths/min, Blood Pressure: 119/83 mmHg, Capillary Blood Glucose: 106  mg/dl. Cardiovascular Pedal pulses are palpable on the left. General Notes: Wound exam; left plantar first metatarsal head wound is smaller. The surface of the wound did not look completely healthy somewhat pale looking however in view of the continued improvement in surface area no debridement was necessary. No evidence of surrounding infection Integumentary (Hair, Skin) No evidence of surrounding infection. Wound #2R status is Open. Original cause of wound was Blister. The wound is located on the Left Metatarsal head first. The wound measures 0.9cm length x 0.8cm width x 0.2cm depth; 0.565cm^2 area and 0.113cm^3 volume. There is Fat Layer (Subcutaneous Tissue) exposed. There is no tunneling or undermining noted. There is a medium amount of serosanguineous drainage noted. The wound margin is thickened. There is large (67-100%) pink granulation within the wound bed. There is no necrotic tissue within the wound bed. Assessment Active Problems ICD-10 Type 2 diabetes mellitus with foot ulcer Non-pressure chronic ulcer of other part of left foot with fat layer exposed Type 2 diabetes mellitus with diabetic polyneuropathy Acquired absence of right leg below knee Plan Follow-up Appointments: Return Appointment in 2 weeks. Dressing Change Frequency: Change Dressing every other day. Wound Cleansing: May shower and wash wound with soap and water. - when dressing is changed Primary Wound Dressing: Wound #2R Left Metatarsal head first: Calcium Alginate with Silver Secondary Dressing: Foam - foam donut Kerlix/Rolled Gauze - secure with tape Dry Gauze - felt site and felt in shoe Off-Loading: Other: - can wear shoe that you have at home 1. Continue with silver alginate which we are giving him he change this every second day kerlix rolled gauze. 2. He is offloading this in his shoe I think this is made a big difference. Electronic Signature(s) Signed: 10/22/2020 5:54:56 PM By: [name] Ham  MD Entered By: [name] [name] on 10/22/2020 13:51:10 -------------------------------------------------------------------------------- SuperBill Details Patient Name: Date of Service: [name], [place] L. 10/22/2020 Medical Record Number: [id number] Patient Account Number: [id number] Date of Birth/Sex: Treating RN: [birth year] (49 y.o. [name] Primary Care Provider: [name] Other Clinician: Referring Provider: Treating Provider/Extender: Aurora Mask in Treatment: 9 Diagnosis Coding ICD-10 Codes Code Description E11.621 Type 2 diabetes mellitus with foot ulcer L97.522 Non-pressure chronic ulcer of other part of left foot with fat layer exposed E11.42 Type 2 diabetes mellitus with diabetic polyneuropathy Z89.511 Acquired absence of right leg below knee Physician Procedures : CPT4 Code Description Modifier [id number] - WC PHYS LEVEL 3 - EST PT ICD-10 Diagnosis Description E11.621 Type 2 diabetes mellitus with foot ulcer L97.522 Non-pressure  chronic ulcer of other part of left foot with fat layer exposed Quantity: 1 Electronic Signature(s) Signed: 10/22/2020 5:54:56 PM By: [name] Ham MD Entered By: [name] [name] on 10/22/2020 13:51:27

## 2020-10-22 NOTE — Progress Notes (Signed)
[name], [name] ([id number]) Visit Report for 10/22/2020 Arrival Information Details Patient Name: Date of Service: [name] [name] 10/22/2020 1:00 PM Medical Record Number: [id number] Patient Account Number: [id number] Date of Birth/Sex: Treating RN: 06/03/1971 (49 y.o. [name] [name], [name] [name] Primary Care Wreatha Sturgeon: [name] Other Clinician: Referring Lakesha Levinson: Treating Leyton Brownlee/Extender: Aurora Mask in Treatment: 9 Visit Information History Since Last Visit Added or deleted any medications: No Patient Arrived: Ambulatory Any new allergies or adverse reactions: No Arrival Time: 13:07 Had a fall or experienced change in No Accompanied By: self activities of daily living that may affect Transfer Assistance: None risk of falls: Patient Identification Verified: Yes Signs or symptoms of abuse/neglect since last visito No Secondary Verification Process Completed: Yes Hospitalized since last visit: No Patient Requires Transmission-Based Precautions: No Implantable device outside of the clinic excluding No Patient Has Alerts: No cellular tissue based products placed in the center since last visit: Has Dressing in Place as Prescribed: Yes Pain Present Now: No Electronic Signature(s) Signed: 10/22/2020 5:52:12 PM By: Rhae Hammock RN Entered By: Rhae Hammock on 10/22/2020 13:08:03 -------------------------------------------------------------------------------- Encounter Discharge Information Details Patient Name: Date of Service: [name] [name], [name] 10/22/2020 1:00 PM Medical Record Number: [id number] Patient Account Number: [id number] Date of Birth/Sex: Treating RN: November 01, 1971 (49 y.o. [name] [name] Primary Care Kaci Freel: [name] Other Clinician: Referring Shanena Pellegrino: Treating Jovonda Selner/Extender: Aurora Mask in Treatment: 9 Encounter Discharge Information Items Discharge Condition:  Stable Ambulatory Status: Ambulatory Discharge Destination: Home Transportation: Private Auto Accompanied By: alone Schedule Follow-up Appointment: Yes Clinical Summary of Care: Patient Declined Electronic Signature(s) Signed: 10/22/2020 6:12:47 PM By: Levan Hurst RN, BSN Entered By: Levan Hurst on 10/22/2020 14:24:15 -------------------------------------------------------------------------------- Lower Extremity Assessment Details Patient Name: Date of Service: [name] [name], [name] 10/22/2020 1:00 PM Medical Record Number: [id number] Patient Account Number: [id number] Date of Birth/Sex: Treating RN: 04-21-1971 (49 y.o. [name] [name], [name] [name] Primary Care Coron Rossano: [name] Other Clinician: Referring [name] [name]: Treating [name] [name]/Extender: Aurora Mask in Treatment: 9 Edema Assessment Assessed: [name] [name]: No] [name] [name]: No] Edema: [Left: Ye] [Right: s] Calf Left: Right: Point of Measurement: 50 cm From Medial Instep 39 cm Ankle Left: Right: Point of Measurement: 11 cm From Medial Instep 26 cm Vascular Assessment Pulses: Dorsalis Pedis Palpable: [Left:Yes] Posterior Tibial Palpable: [Left:Yes] Electronic Signature(s) Signed: 10/22/2020 5:52:12 PM By: Rhae Hammock RN Entered By: Rhae Hammock on 10/22/2020 13:13:14 -------------------------------------------------------------------------------- Multi Wound Chart Details Patient Name: Date of Service: [name] [name], [name] 10/22/2020 1:00 PM Medical Record Number: [id number] Patient Account Number: [id number] Date of Birth/Sex: Treating RN: Mar 29, 1971 (49 y.o. [name] [name]) [name] [name] Primary Care Shantell Belongia: [name] Other Clinician: Referring [name] [name]: Treating [name] [name]/Extender: Aurora Mask in Treatment: 9 Vital Signs Height(in): 77 Capillary Blood Glucose(mg/dl): 106 Weight(lbs): 232 Pulse(bpm): 97 Body Mass Index(BMI): 28 Blood Pressure(mmHg):  119/83 Temperature(F): 98.6 Respiratory Rate(breaths/min): 18 Photos: [2R:No Photos Left Metatarsal head first] [N/A:N/A N/A] Wound Location: [2R:Blister] [N/A:N/A] Wounding Event: [2R:Diabetic Wound/Ulcer of the Lower] [N/A:N/A] Primary Etiology: [2R:Extremity Anemia, Hypertension, Peripheral] [N/A:N/A] Comorbid History: [2R:Venous Disease, Type II Diabetes 06/23/2020] [N/A:N/A] Date Acquired: [2R:9] [N/A:N/A] Weeks of Treatment: [2R:Open] [N/A:N/A] Wound Status: [2R:Yes] [N/A:N/A] Wound Recurrence: [2R:0.9x0.8x0.2] [N/A:N/A] Measurements L x W x D (cm) [2R:0.565] [N/A:N/A] A (cm) : rea [2R:0.113] [N/A:N/A] Volume (cm) : [2R:-1722.60%] [N/A:N/A] % Reduction in A rea: [2R:-3666.70%] [N/A:N/A] % Reduction in Volume: [2R:Grade 2] [N/A:N/A] Classification: [2R:Medium] [N/A:N/A] Exudate A mount: [2R:Serosanguineous] [N/A:N/A] Exudate Type: [2R:red, brown] [N/A:N/A] Exudate Color: [  2R:Thickened] [N/A:N/A] Wound Margin: [2R:Large (67-100%)] [N/A:N/A] Granulation A mount: [2R:Pink] [N/A:N/A] Granulation Quality: [2R:None Present (0%)] [N/A:N/A] Necrotic A mount: [2R:Fat Layer (Subcutaneous Tissue): Yes N/A] Exposed Structures: [2R:Fascia: No Tendon: No Muscle: No Joint: No Bone: No Medium (34-66%)] [N/A:N/A] Treatment Notes Electronic Signature(s) Signed: 10/22/2020 5:54:56 PM By: Linton Ham MD Signed: 10/22/2020 5:56:00 PM By: [name] Coria RN Entered By: Linton Ham on 10/22/2020 13:45:42 -------------------------------------------------------------------------------- Multi-Disciplinary Care Plan Details Patient Name: Date of Service: [name] [name], [name] 10/22/2020 1:00 PM Medical Record Number: [id number] Patient Account Number: [id number] Date of Birth/Sex: Treating RN: Jul 08, 1971 (49 y.o. [name] [name] Primary Care [name] [name]: [name] Other Clinician: Referring [name] [name]: Treating [name] [name]/Extender: Aurora Mask in Treatment:  9 Active Inactive Wound/Skin Impairment Nursing Diagnoses: Impaired tissue integrity Goals: Ulcer/skin breakdown will have a volume reduction of 30% by week 4 Date Initiated: 08/16/2020 Target Resolution Date: 10/31/2020 Goal Status: Active Interventions: Provide education on ulcer and skin care Notes: Electronic Signature(s) Signed: 10/22/2020 5:56:00 PM By: [name] Coria RN Entered By: [name] [name] on 10/22/2020 13:13:02 -------------------------------------------------------------------------------- Pain Assessment Details Patient Name: Date of Service: [name] [name], [name] 10/22/2020 1:00 PM Medical Record Number: [id number] Patient Account Number: [id number] Date of Birth/Sex: Treating RN: 1971/01/08 (49 y.o. [name] Primary Care Osric Klopf: [name] Other Clinician: Referring Thang Flett: Treating Valeria Boza/Extender: Aurora Mask in Treatment: 9 Active Problems Location of Pain Severity and Description of Pain Patient Has Paino Yes Site Locations Pain Location: Pain in Ulcers With Dressing Change: Yes Duration of the Pain. Constant / Intermittento Intermittent Rate the pain. Current Pain Level: 4 Worst Pain Level: 10 Least Pain Level: 0 Tolerable Pain Level: 7 Character of Pain Describe the Pain: Aching Pain Management and Medication Current Pain Management: Medication: Yes Cold Application: No Rest: Yes Massage: No Activity: No T.E.N.S.: No Heat Application: No Leg drop or elevation: No Is the Current Pain Management Adequate: Adequate How does your wound impact your activities of daily livingo Sleep: No Bathing: No Appetite: No Relationship With Others: No Bladder Continence: No Emotions: No Bowel Continence: No Work: No Toileting: No Drive: No Dressing: No Hobbies: No Electronic Signature(s) Signed: 10/22/2020 5:52:12 PM By: Rhae Hammock RN Entered By: Rhae Hammock on 10/22/2020  13:10:30 -------------------------------------------------------------------------------- Patient/Caregiver Education Details Patient Name: Date of Service: [name] [name] 11/30/2021andnbsp1:00 PM Medical Record Number: [id number] Patient Account Number: [id number] Date of Birth/Gender: Treating RN: 03/15/1971 (49 y.o. [name] [name] Primary Care Physician: [name] Other Clinician: Referring Physician: Treating Physician/Extender: Aurora Mask in Treatment: 9 Education Assessment Education Provided To: Patient Education Topics Provided Wound/Skin Impairment: Methods: Explain/Verbal Responses: State content correctly Electronic Signature(s) Signed: 10/22/2020 5:56:00 PM By: [name] Coria RN Entered By: [name] [name] on 10/22/2020 13:13:18 -------------------------------------------------------------------------------- Wound Assessment Details Patient Name: Date of Service: [name] [name], [name] 10/22/2020 1:00 PM Medical Record Number: [id number] Patient Account Number: [id number] Date of Birth/Sex: Treating RN: [birth year] (49 y.o. [name] [name], [name] [name] Primary Care Eric Morganti: [name] Other Clinician: Referring Jaziah Goeller: Treating Dionysios Massman/Extender: Aurora Mask in Treatment: 9 Wound Status Wound Number: 2R Primary Diabetic Wound/Ulcer of the Lower Extremity Etiology: Wound Location: Left Metatarsal head first Wound Status: Open Wounding Event: Blister Comorbid Anemia, Hypertension, Peripheral Venous Disease, Type II Date Acquired: 06/23/2020 History: Diabetes Weeks Of Treatment: 9 Clustered Wound: No Wound Measurements Length: (cm) 0.9 Width: (cm) 0.8 Depth: (cm) 0.2 Area: (cm) 0.565 Volume: (cm) 0.113 % Reduction in Area: -1722.6% %  Reduction in Volume: -3666.7% Epithelialization: Medium (34-66%) Tunneling: No Undermining: No Wound Description Classification: Grade 2 Wound Margin:  Thickened Exudate Amount: Medium Exudate Type: Serosanguineous Exudate Color: red, brown Foul Odor After Cleansing: No Slough/Fibrino Yes Wound Bed Granulation Amount: Large (67-100%) Exposed Structure Granulation Quality: Pink Fascia Exposed: No Necrotic Amount: None Present (0%) Fat Layer (Subcutaneous Tissue) Exposed: Yes Tendon Exposed: No Muscle Exposed: No Joint Exposed: No Bone Exposed: No Treatment Notes Wound #2R (Left Metatarsal head first) 1. Cleanse With Wound Cleanser 3. Primary Dressing Applied Calcium Alginate Ag 4. Secondary Dressing Dry Gauze Roll Gauze Other secondary dressing (specify in notes) 5. Secured With Tape Notes foam donut as secondary. patient wearing new shoes with extra insoles. Electronic Signature(s) Signed: 10/22/2020 5:52:12 PM By: Rhae Hammock RN Entered By: Rhae Hammock on 10/22/2020 13:18:02 -------------------------------------------------------------------------------- Vitals Details Patient Name: Date of Service: [name] [name], [name] 10/22/2020 1:00 PM Medical Record Number: [id number] Patient Account Number: [id number] Date of Birth/Sex: Treating RN: 01-Feb-1971 (49 y.o. [name] [name], [name] [name] Primary Care Lyndsee Casa: [name] Other Clinician: Referring Tiger Spieker: Treating Jemarcus Dougal/Extender: Aurora Mask in Treatment: 9 Vital Signs Time Taken: 13:08 Temperature (F): 98.6 Height (in): 77 Pulse (bpm): 97 Weight (lbs): 232 Respiratory Rate (breaths/min): 18 Body Mass Index (BMI): 27.5 Blood Pressure (mmHg): 119/83 Capillary Blood Glucose (mg/dl): 106 Reference Range: 80 - 120 mg / dl Electronic Signature(s) Signed: 10/22/2020 5:52:12 PM By: Rhae Hammock RN Entered By: Rhae Hammock on 10/22/2020 13:09:27

## 2020-11-05 ENCOUNTER — Other Ambulatory Visit: Payer: Self-pay

## 2020-11-05 ENCOUNTER — Encounter (HOSPITAL_BASED_OUTPATIENT_CLINIC_OR_DEPARTMENT_OTHER): Payer: Medicare Other | Attending: Internal Medicine | Admitting: Internal Medicine

## 2020-11-05 DIAGNOSIS — E1142 Type 2 diabetes mellitus with diabetic polyneuropathy: Secondary | ICD-10-CM | POA: Insufficient documentation

## 2020-11-05 DIAGNOSIS — Z89511 Acquired absence of right leg below knee: Secondary | ICD-10-CM | POA: Insufficient documentation

## 2020-11-05 DIAGNOSIS — L97522 Non-pressure chronic ulcer of other part of left foot with fat layer exposed: Secondary | ICD-10-CM | POA: Diagnosis not present

## 2020-11-05 DIAGNOSIS — E11621 Type 2 diabetes mellitus with foot ulcer: Secondary | ICD-10-CM | POA: Diagnosis present

## 2020-11-06 NOTE — Progress Notes (Signed)
YEHYA, BRENDLE ([id number]) Visit Report for 11/05/2020 Debridement Details Patient Name: Date of Service: [name] Arrow [name] 11/05/2020 12:30 PM Medical Record Number: [id number] Patient Account Number: [id number] Date of Birth/Sex: Treating RN: Feb 08, 1971 (49 y.o. [name]) [name] [name] Primary Care Provider: [name] Other Clinician: Referring Provider: Treating Provider/Extender: Aurora Mask in Treatment: 11 Debridement Performed for Assessment: Wound #2R Left Metatarsal head first Performed By: Physician Ricard Dillon., MD Debridement Type: Debridement Severity of Tissue Pre Debridement: Fat layer exposed Level of Consciousness (Pre-procedure): Awake and Alert Pre-procedure Verification/Time Out Yes - 13:00 Taken: Start Time: 13:00 Pain Control: Lidocaine 4% T opical Solution T Area Debrided (L x W): otal 0 (cm) x 0.9 (cm) = 0 (cm) Tissue and other material debrided: Viable, Non-Viable, Subcutaneous, Skin: Dermis Level: Skin/Subcutaneous Tissue Debridement Description: Excisional Instrument: Curette Bleeding: Moderate Hemostasis Achieved: Silver Nitrate End Time: 13:02 Procedural Pain: 0 Post Procedural Pain: 0 Response to Treatment: Procedure was tolerated well Level of Consciousness (Post- Awake and Alert procedure): Post Debridement Measurements of Total Wound Length: (cm) 1.1 Width: (cm) 1 Depth: (cm) 0.2 Volume: (cm) 0.173 Character of Wound/Ulcer Post Debridement: Improved Severity of Tissue Post Debridement: Fat layer exposed Post Procedure Diagnosis Same as Pre-procedure Electronic Signature(s) Signed: 11/05/2020 4:37:04 PM By: [name] Coria RN Signed: 11/05/2020 5:21:51 PM By: [name] Ham MD Entered By: [name] [name] on 11/05/2020 13:06:28 -------------------------------------------------------------------------------- HPI Details Patient Name: Date of Service: [name] [name], [name] 11/05/2020 12:30  PM Medical Record Number: [id number] Patient Account Number: [id number] Date of Birth/Sex: Treating RN: 04/21/1971 (49 y.o. [name] [name] Primary Care Provider: [name] Other Clinician: Referring Provider: Treating Provider/Extender: Aurora Mask in Treatment: 11 History of Present Illness HPI Description: 04/21/18 ADMISSION This is a 49 year old man who is a type II diabetic. In spite of fact his hemoglobin A1c is actually quite good 5.73 months ago he is at a really difficult time over the last 6-7 months. He developed a rapidly progressive infection in the right foot in November 2018 associated with osteomyelitis and necrotizing fasciitis. 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 The stump required and a revision on 11/24/17. The stump revision was advertised as being secondary to falls. I'm not sure the progressive history here however this area is actually closed over. The patient tells me over the same timeframe he has had wounds on the plantar aspect of his left foot. In the ED saw Dr. Sharol Given in April of this year. 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 It is really not clear that this patient is been dressing this with anything. He came in with the clinic without any specific dressing on the wound areas using his own tennis shoe. He tells me that he only ambulates of course to do a pivot transfer. He does not have his prosthesis for the right leg as of yet and he blames Medicaid for this. He does however use the foot to push himself along in his wheelchair at home. The patient has not had formal arterial studies. ABI in our clinic on the left was 1.13. Patient's past medical history includes type 2 diabetes, fracture of the right fibula. I see that he was treated for abscesses on his right buttock and chest in 2016. I did not look at the microbiology of this. 04/28/18; patient comes back in the clinic today with  the wound pretty much the same as when  he came in here last week. Small opening lots of undermining relatively. He tells [place] that nothing is really been on this for 3 days in spite of the fact that we gave him enough to dress this easily especially such a small wound. He says he lives with his mother, she is not capable of assisting with this he is changing the dressings himself. 05/05/18; much better-looking wound today. Smaller. There is some undermining medially however that's only perhaps 2 mm. No surrounding erythema. We've been using silver collagen 05/12/18; small wound on the first metatarsal head. No undermining no surrounding erythema. We've been using silver collagen he has home health coming out to change 05/20/18 the wound on the first metatarsal head looks better. Covered in surface debris/callus nonviable tissue. Required debridement but post debridement this looks quite good. We've been using silver collagen. He has home health 05/27/18; first metatarsal head wound continues to improve. Just about completely closed. Still a lot of surface debridement callus. We've been using silver collagen 06/06/18; the first metatarsal wound is completely healed over. There is still a lot of callus. I gently removed some of this just to make sure that there was no open area and there is not. The patient mentions to me that his left leg has felt like "lead" for about a week READMISSION 08/16/2020 This is a 49 year old man with type 2 diabetes. He returns to clinic today with a 1 month history of a blister and callus over the first metatarsal head. This is in exactly the same area as when he was here in 2019. 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 He has standard running shoes on the left foot to match the area in his prosthesis. He has only been covering this area with a Band-Aid has not really been specifically dressing this. ABI in our clinic on the left  was 1.16. This is essentially stable from the value in 2019 10/1 the patient has a linear wound over the left first metatarsal head. Again not a lot of callus thick skin around this that I removed with a #3 curette. This is not go deep to bone or muscle it does not look to be infected. 10/8 first metatarsal head. The wound looks like it is closing however this is going to be a difficult area to offload in the future. He has a size 15 shoe and he says his shoes are years old. The inserts in these current shoes are totally useless and I have advised him to replace these. He may need a new pair of shoes and I have he got original prosthesis at [facility] [place] I have asked him to go back there. 11/9; the patient has not been here in more than a month. Not sure he was healed when he was here the last time. I told him he would have to get new shoes and certainly offload the area over the left first metatarsal head. He has done nothing of the kind. He arrives back in clinic with the same old new balance sneakers. A very large separating callus over the first metatarsal head on the left 11/16; he has purchased new shoes and has at least 2 insoles like I asked. The wound is measuring much smaller. He is using silver alginate he changes the dressing himself 11/30; wound is measurably smaller in length of about a half a centimeter. This is generally very little depth. We  have been using silver alginate. He changes the dressing himself every second day. 12/14; wound is about the same size. Significant undermining medially relative to the size of the wound. We have been using silver alginate. There is not a way to offload this area as he walks with a prosthesis on the right leg Electronic Signature(s) Signed: 11/05/2020 5:21:51 PM By: [name] Ham MD Entered By: [name] [name] on 11/05/2020 13:11:29 -------------------------------------------------------------------------------- Physical Exam  Details Patient Name: Date of Service: [name] [name], [name] 11/05/2020 12:30 PM Medical Record Number: [id number] Patient Account Number: [id number] Date of Birth/Sex: Treating RN: [birth year] (49 y.o. [name] [name] Primary Care Provider: [name] Other Clinician: Referring Provider: Treating Provider/Extender: Aurora Mask in Treatment: 48 Constitutional Patient is hypertensive.. Pulse regular and within target range for patient.Marland Kitchen Respirations regular, non-labored and within target range.. Temperature is normal and within the target range for the patient.Marland Kitchen Appears in no distress. Notes Wound exam; left plantar first metatarsal head. Undermining medially by perhaps three quarters of a centimeter however quite large compared to the size of the wound. I used pickups and a #15 scalpel to remove this area also skin and subcutaneous tissue from around the wound margin. Electronic Signature(s) Signed: 11/05/2020 5:21:51 PM By: [name] Ham MD Entered By: [name] [name] on 11/05/2020 13:12:19 -------------------------------------------------------------------------------- Physician Orders Details Patient Name: Date of Service: [place] [name], [name] 11/05/2020 12:30 PM Medical Record Number: [id number] Patient Account Number: [id number] Date of Birth/Sex: Treating RN: [birth year] (49 y.o. [name] [name] Primary Care Provider: [name] Other Clinician: Referring Provider: Treating Provider/Extender: Aurora Mask in Treatment: 11 Verbal / Phone Orders: No Diagnosis Coding Follow-up Appointments Return Appointment in 2 weeks. Bathing/ Shower/ Hygiene May shower and wash wound with soap and water. - WITH DRESSING CHANGES. Edema Control - Lymphedema / SCD / Other Elevate legs to the level of the heart or above for 30 minutes daily and/or when sitting, a frequency of: Avoid standing for long periods of time. Wound  Treatment Wound #2R - Metatarsal head first Wound Laterality: Left Cleanser: Wound Cleanser Every Other Day/15 Days Discharge Instructions: Cleanse the wound with wound cleanser prior to applying a clean dressing using gauze sponges, not tissue or cotton balls. Peri-Wound Care: Sween Lotion (Moisturizing lotion) Every Other Day/15 Days Discharge Instructions: Apply moisturizing lotion as directed Prim Dressing: PolyMem Silver Non-Adhesive Dressing, 4.25x4.25 in Every Other Day/15 Days ary Discharge Instructions: Apply to wound bed as instructed Secondary Dressing: Woven Gauze Sponge, Non-Sterile 4x4 in Every Other Day/15 Days Discharge Instructions: Apply over primary dressing as directed. Secured With: [employer], 4.5x3.1 (in/yd) Every Other Day/15 Days Discharge Instructions: Secure with Kerlix as directed. Electronic Signature(s) Signed: 11/05/2020 5:21:51 PM By: [name] Ham MD Signed: 11/06/2020 9:59:17 AM By: Rhae Hammock RN Entered By: Rhae Hammock on 11/05/2020 13:06:30 -------------------------------------------------------------------------------- Problem List Details Patient Name: Date of Service: [name] [name], [name] 11/05/2020 12:30 PM Medical Record Number: [id number] Patient Account Number: [id number] Date of Birth/Sex: Treating RN: [birth year] (49 y.o. [name] [name] Primary Care Provider: [name] Other Clinician: Referring Provider: Treating Provider/Extender: Aurora Mask in Treatment: 11 Active Problems ICD-10 Encounter Code Description Active Date MDM Diagnosis E11.621 Type 2 diabetes mellitus with foot ulcer 08/16/2020 No Yes L97.522 Non-pressure chronic ulcer of other part of left foot with fat layer exposed 08/16/2020 No Yes E11.42 Type 2 diabetes mellitus with diabetic polyneuropathy 08/16/2020 No Yes Z89.511 Acquired absence of right leg below  knee 08/16/2020 No Yes Inactive Problems Resolved  Problems Electronic Signature(s) Signed: 11/05/2020 5:21:51 PM By: [name] Ham MD Entered By: [name] [name] on 11/05/2020 13:04:31 -------------------------------------------------------------------------------- Progress Note Details Patient Name: Date of Service: [name] [name], [name] 11/05/2020 12:30 PM Medical Record Number: [id number] Patient Account Number: [id number] Date of Birth/Sex: Treating RN: [birth year] (49 y.o. [name] [name] Primary Care Provider: [name] Other Clinician: Referring Provider: Treating Provider/Extender: Aurora Mask in Treatment: 11 Subjective History of Present Illness (HPI) 04/21/18 ADMISSION This is a 49 year old man who is a type II diabetic. In spite of fact his hemoglobin A1c is actually quite good 5.73 months ago he is at a really difficult time over the last 6-7 months. He developed a rapidly progressive infection in the right foot in November 2018 associated with osteomyelitis and necrotizing fasciitis. 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 The stump required and a revision on 11/24/17. The stump revision was advertised as being secondary to falls. I'm not sure the progressive history here however this area is actually closed over. The patient tells me over the same timeframe he has had wounds on the plantar aspect of his left foot. In the ED saw Dr. Sharol Given in April of this year. 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 It is really not clear that this patient is been dressing this with anything. He came in with the clinic without any specific dressing on the wound areas using his own tennis shoe. He tells me that he only ambulates of course to do a pivot transfer. He does not have his prosthesis for the right leg as of yet and he blames Medicaid for this. He does however use the foot to push himself along in his wheelchair at home. The patient has not had formal  arterial studies. ABI in our clinic on the left was 1.13. Patient's past medical history includes type 2 diabetes, fracture of the right fibula. I see that he was treated for abscesses on his right buttock and chest in 2016. I did not look at the microbiology of this. 04/28/18; patient comes back in the clinic today with the wound pretty much the same as when he came in here last week. Small opening lots of undermining relatively. He tells [place] that nothing is really been on this for 3 days in spite of the fact that we gave him enough to dress this easily especially such a small wound. He says he lives with his mother, she is not capable of assisting with this he is changing the dressings himself. 05/05/18; much better-looking wound today. Smaller. There is some undermining medially however that's only perhaps 2 mm. No surrounding erythema. We've been using silver collagen 05/12/18; small wound on the first metatarsal head. No undermining no surrounding erythema. We've been using silver collagen he has home health coming out to change 05/20/18 the wound on the first metatarsal head looks better. Covered in surface debris/callus nonviable tissue. Required debridement but post debridement this looks quite good. We've been using silver collagen. He has home health 05/27/18; first metatarsal head wound continues to improve. Just about completely closed. Still a lot of surface debridement callus. We've been using silver collagen 06/06/18; the first metatarsal wound is completely healed over. There is still a lot of callus. I gently removed some of this just to make sure that there was no open area and there is not. The patient mentions to  me that his left leg has felt like "lead" for about a week READMISSION 08/16/2020 This is a 49 year old man with type 2 diabetes. He returns to clinic today with a 1 month history of a blister and callus over the first metatarsal head. This is in exactly the same area as when he  was here in 2019. 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 He has standard running shoes on the left foot to match the area in his prosthesis. He has only been covering this area with a Band-Aid has not really been specifically dressing this. ABI in our clinic on the left was 1.16. This is essentially stable from the value in 2019 10/1 the patient has a linear wound over the left first metatarsal head. Again not a lot of callus thick skin around this that I removed with a #3 curette. This is not go deep to bone or muscle it does not look to be infected. 10/8 first metatarsal head. The wound looks like it is closing however this is going to be a difficult area to offload in the future. He has a size 15 shoe and he says his shoes are years old. The inserts in these current shoes are totally useless and I have advised him to replace these. He may need a new pair of shoes and I have he got original prosthesis at [employer] [place] I have asked him to go back there. 11/9; the patient has not been here in more than a month. Not sure he was healed when he was here the last time. I told him he would have to get new shoes and certainly offload the area over the left first metatarsal head. He has done nothing of the kind. He arrives back in clinic with the same old new balance sneakers. A very large separating callus over the first metatarsal head on the left 11/16; he has purchased new shoes and has at least 2 insoles like I asked. The wound is measuring much smaller. He is using silver alginate he changes the dressing himself 11/30; wound is measurably smaller in length of about a half a centimeter. This is generally very little depth. We have been using silver alginate. He changes the dressing himself every second day. 12/14; wound is about the same size. Significant undermining medially relative to the size of the wound. We have been using silver alginate.  There is not a way to offload this area as he walks with a prosthesis on the right leg Objective Constitutional Patient is hypertensive.. Pulse regular and within target range for patient.Marland Kitchen Respirations regular, non-labored and within target range.. Temperature is normal and within the target range for the patient.Marland Kitchen Appears in no distress. Vitals Time Taken: 12:43 PM, Height: 77 in, Source: Stated, Weight: 232 lbs, Source: Stated, BMI: 27.5, Temperature: 98.4 F, Pulse: 96 bpm, Respiratory Rate: 18 breaths/min, Blood Pressure: 151/90 mmHg, Capillary Blood Glucose: 137 mg/dl. General Notes: glucose per pt report last night General Notes: Wound exam; left plantar first metatarsal head. Undermining medially by perhaps three quarters of a centimeter however quite large compared to the size of the wound. I used pickups and a #15 scalpel to remove this area also skin and subcutaneous tissue from around the wound margin. Integumentary (Hair, Skin) Wound #2R status is Open. Original cause of wound was Blister. The wound is located on the Left Metatarsal head first. The wound measures 0.7cm length x  0.7cm width x 0.1cm depth; 0.385cm^2 area and 0.038cm^3 volume. There is Fat Layer (Subcutaneous Tissue) exposed. There is undermining starting at 6:00 and ending at 11:00 with a maximum distance of 0.4cm. There is a small amount of serosanguineous drainage noted. The wound margin is thickened. There is large (67-100%) pink, pale granulation within the wound bed. There is no necrotic tissue within the wound bed. 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 There was a Excisional Skin/Subcutaneous Tissue Debridement performed by Ricard Dillon., MD. With the following instrument(s): Curette to remove Viable and Non-Viable tissue/material. Material removed includes Subcutaneous Tissue and Skin: Dermis and after achieving pain control using Lidocaine 4% Topical Solution. No specimens were taken. A time out was conducted at 13:00, prior to the start of the procedure. A Moderate amount of bleeding was controlled with Silver Nitrate. The procedure was tolerated well with a pain level of 0 throughout and a pain level of 0 following the procedure. Post Debridement Measurements: 1.1cm length x 1cm width x 0.2cm depth; 0.173cm^3 volume. Character of Wound/Ulcer Post Debridement is improved. Severity of Tissue Post Debridement is: Fat layer exposed. Post procedure Diagnosis Wound #2R: Same as Pre-Procedure Plan Follow-up Appointments: Return Appointment in 2 weeks. Bathing/ Shower/ Hygiene: May shower and wash wound with soap and water. - WITH DRESSING CHANGES. Edema Control - Lymphedema / SCD / Other: Elevate legs to the level of the heart or above for 30 minutes daily and/or when sitting, a frequency of: Avoid standing for long periods of time. WOUND #2R: - Metatarsal head first Wound Laterality: Left Cleanser: Wound Cleanser Every Other Day/15 Days Discharge Instructions: Cleanse the wound with wound cleanser prior to applying a clean dressing using gauze sponges, not tissue or cotton balls. Peri-Wound Care: Sween Lotion (Moisturizing lotion) Every Other Day/15 Days Discharge Instructions: Apply moisturizing lotion as directed Prim Dressing: PolyMem Silver Non-Adhesive Dressing, 4.25x4.25 in Every Other Day/15 Days ary Discharge Instructions: Apply to wound bed as instructed Secondary Dressing: Woven Gauze Sponge, Non-Sterile 4x4 in Every Other Day/15 Days Discharge Instructions: Apply over  primary dressing as directed. Secured With: [employer], 4.5x3.1 (in/yd) Every Other Day/15 Days Discharge Instructions: Secure with Kerlix as directed. 1. I change the dressing to polymen 2 asked him to watch the amount of time he is walking 3. There is no evidence of infection I think this is all an offloading issue Electronic Signature(s) Signed: 11/05/2020 5:21:51 PM By: [name] Ham MD Entered By: [name] [name] on 11/05/2020 13:13:31 -------------------------------------------------------------------------------- SuperBill Details Patient Name: Date of Service: [name] [name], [place]. 11/05/2020 Medical Record Number: [id number] Patient Account Number: [id number] Date of Birth/Sex: Treating RN: [birth year] (49 y.o. [name], [name] Primary Care Provider: [name] Other Clinician: Referring Provider: Treating Provider/Extender: Aurora Mask in Treatment: 11 Diagnosis Coding ICD-10 Codes Code Description E11.621 Type 2 diabetes mellitus with foot ulcer L97.522 Non-pressure chronic ulcer of other part of left foot with fat layer exposed E11.42 Type 2 diabetes mellitus with diabetic polyneuropathy Z89.511 Acquired absence of right leg below knee Facility Procedures CPT4 Code: [id number] Description: 51761 - DEB SUBQ TISSUE 20 SQ  CM/< ICD-10 Diagnosis Description L97.522 Non-pressure chronic ulcer of other part of left foot with fat layer exposed Modifier: Quantity: 1 Physician Procedures Electronic Signature(s) Signed: 11/05/2020 5:21:51 PM By: [name] Ham MD Previous Signature: 11/05/2020 1:09:57 PM Version By: Rhae Hammock RN Entered By: [name] [name] on 11/05/2020 13:14:36

## 2020-11-06 NOTE — Progress Notes (Signed)
JAVIS, ABBOUD ([id number]) Visit Report for 11/05/2020 Arrival Information Details Patient Name: Date of Service: [name] [name] 11/05/2020 12:30 PM Medical Record Number: [id number] Patient Account Number: [id number] Date of Birth/Sex: Treating RN: 1970/12/06 (49 y.o. [name], Vaughan Basta Primary Care Aryan Sparks: [name] Other Clinician: Referring Layanna Charo: Treating Alvaretta Eisenberger/Extender: Aurora Mask in Treatment: 11 Visit Information History Since Last Visit Added or deleted any medications: No Patient Arrived: Ambulatory Any new allergies or adverse reactions: No Arrival Time: 12:41 Had a fall or experienced change in No Accompanied By: self activities of daily living that may affect Transfer Assistance: None risk of falls: Patient Identification Verified: Yes Signs or symptoms of abuse/neglect since last visito No Secondary Verification Process Completed: Yes Hospitalized since last visit: No Patient Requires Transmission-Based Precautions: No Implantable device outside of the clinic excluding No Patient Has Alerts: No cellular tissue based products placed in the center since last visit: Has Dressing in Place as Prescribed: Yes Pain Present Now: No Electronic Signature(s) Signed: 11/05/2020 4:55:24 PM By: Baruch Gouty RN, BSN Entered By: Baruch Gouty on 11/05/2020 12:43:05 -------------------------------------------------------------------------------- Encounter Discharge Information Details Patient Name: Date of Service: [name] [name], [name] 11/05/2020 12:30 PM Medical Record Number: [id number] Patient Account Number: [id number] Date of Birth/Sex: Treating RN: 1971-09-13 (49 y.o. [name] Primary Care Jennings Corado: [name] Other Clinician: Referring Daschel Roughton: Treating Romy Ipock/Extender: Aurora Mask in Treatment: 11 Encounter Discharge Information Items Post Procedure  Vitals Discharge Condition: Stable Temperature (F): 98.4 Ambulatory Status: Ambulatory Pulse (bpm): 96 Discharge Destination: Home Respiratory Rate (breaths/min): 18 Transportation: Private Auto Blood Pressure (mmHg): 151/90 Accompanied By: self Schedule Follow-up Appointment: Yes Clinical Summary of Care: Electronic Signature(s) Signed: 11/05/2020 4:43:44 PM By: Deon Pilling Entered By: Deon Pilling on 11/05/2020 13:20:45 -------------------------------------------------------------------------------- Lower Extremity Assessment Details Patient Name: Date of Service: [name] Arrow [name] 11/05/2020 12:30 PM Medical Record Number: [id number] Patient Account Number: [id number] Date of Birth/Sex: Treating RN: 09-17-1971 (50 y.o. [name] Primary Care Caide Campi: [name] Other Clinician: Referring Torrence Branagan: Treating Callan Norden/Extender: Aurora Mask in Treatment: 11 Edema Assessment Assessed: [name]: No] [name]: No] Edema: [Left: Ye] [Right: s] Calf Left: Right: Point of Measurement: 50 cm From Medial Instep 39 cm Ankle Left: Right: Point of Measurement: 11 cm From Medial Instep 25.5 cm Vascular Assessment Pulses: Dorsalis Pedis Palpable: [Left:Yes] Electronic Signature(s) Signed: 11/05/2020 4:55:24 PM By: Baruch Gouty RN, BSN Entered By: Baruch Gouty on 11/05/2020 12:48:45 -------------------------------------------------------------------------------- Multi Wound Chart Details Patient Name: Date of Service: [name] [name], [name] 11/05/2020 12:30 PM Medical Record Number: [id number] Patient Account Number: [id number] Date of Birth/Sex: Treating RN: [birth year] (49 y.o. [name] Primary Care Sorrel Cassetta: [name] Other Clinician: Referring Trinity Haun: Treating Deland Slocumb/Extender: Aurora Mask in Treatment: 11 Vital Signs Height(in): 56 Capillary Blood Glucose(mg/dl):  137 Weight(lbs): 232 Pulse(bpm): 48 Body Mass Index(BMI): 28 Blood Pressure(mmHg): 151/90 Temperature(F): 98.4 Respiratory Rate(breaths/min): 18 Photos: [2R:No Photos Left Metatarsal head first] [N/A:N/A N/A] Wound Location: [2R:Blister] [N/A:N/A] Wounding Event: [2R:Diabetic Wound/Ulcer of the Lower] [N/A:N/A] Primary Etiology: [2R:Extremity Anemia, Hypertension, Peripheral] [N/A:N/A] Comorbid History: [2R:Venous Disease, Type II Diabetes 06/23/2020] [N/A:N/A] Date Acquired: [2R:11] [N/A:N/A] Weeks of Treatment: [2R:Open] [N/A:N/A] Wound Status: [2R:Yes] [N/A:N/A] Wound Recurrence: [2R:0.7x0.7x0.1] [N/A:N/A] Measurements L x W x D (cm) [2R:0.385] [N/A:N/A] A (cm) : rea [2R:0.038] [N/A:N/A] Volume (cm) : [2R:-1141.90%] [N/A:N/A] % Reduction in A rea: [2R:-1166.70%] [N/A:N/A] % Reduction in Volume: [2R:6] Starting Position 1 (o'clock): [2R:11] Ending  Position 1 (o'clock): [2R:0.4] Maximum Distance 1 (cm): [2R:Yes] [N/A:N/A] Undermining: [2R:Grade 2] [N/A:N/A] Classification: [2R:Small] [N/A:N/A] Exudate A mount: [2R:Serosanguineous] [N/A:N/A] Exudate Type: [2R:red, brown] [N/A:N/A] Exudate Color: [2R:Thickened] [N/A:N/A] Wound Margin: [2R:Large (67-100%)] [N/A:N/A] Granulation A mount: [2R:Pink, Pale] [N/A:N/A] Granulation Quality: [2R:None Present (0%)] [N/A:N/A] Necrotic A mount: [2R:Fat Layer (Subcutaneous Tissue): Yes N/A] Exposed Structures: [2R:Fascia: No Tendon: No Muscle: No Joint: No Bone: No None] [N/A:N/A] Epithelialization: [2R:Debridement - Excisional] [N/A:N/A] Debridement: Pre-procedure Verification/Time Out 13:00 [N/A:N/A] Taken: [2R:Lidocaine 4% T opical Solution] [N/A:N/A] Pain Control: [2R:Subcutaneous] [N/A:N/A] Tissue Debrided: [2R:Skin/Subcutaneous Tissue] [N/A:N/A] Level: [2R:0] [N/A:N/A] Debridement A (sq cm): [2R:rea Curette] [N/A:N/A] Instrument: [2R:Moderate] [N/A:N/A] Bleeding: [2R:Silver Nitrate] [N/A:N/A] Hemostasis A chieved: [2R:0]  [N/A:N/A] Procedural Pain: [2R:0] [N/A:N/A] Post Procedural Pain: [2R:Procedure was tolerated well] [N/A:N/A] Debridement Treatment Response: [2R:1.1x1x0.2] [N/A:N/A] Post Debridement Measurements L x W x D (cm) [2R:0.173] [N/A:N/A] Post Debridement Volume: (cm) [2R:Debridement] [N/A:N/A] Treatment Notes Electronic Signature(s) Signed: 11/05/2020 4:37:04 PM By: [name] RN Signed: 11/05/2020 5:21:51 PM By: Linton Ham MD Entered By: Linton Ham on 11/05/2020 13:06:19 -------------------------------------------------------------------------------- Multi-Disciplinary Care Plan Details Patient Name: Date of Service: [name] [name], [name] 11/05/2020 12:30 PM Medical Record Number: [id number] Patient Account Number: [id number] Date of Birth/Sex: Treating RN: 09/08/1971 (49 y.o. [name], [name] Primary Care Laurna Shetley: [name] Other Clinician: Referring Francille Wittmann: Treating Forrester Blando/Extender: Aurora Mask in Treatment: 11 Active Inactive Wound/Skin Impairment Nursing Diagnoses: Impaired tissue integrity Goals: Ulcer/skin breakdown will have a volume reduction of 30% by week 4 Date Initiated: 08/16/2020 Target Resolution Date: 12/19/2020 Goal Status: Active Interventions: Provide education on ulcer and skin care Notes: Electronic Signature(s) Signed: 11/05/2020 12:51:50 PM By: Rhae Hammock RN Entered By: Rhae Hammock on 11/05/2020 12:51:49 -------------------------------------------------------------------------------- Pain Assessment Details Patient Name: Date of Service: [name] [name], [name] 11/05/2020 12:30 PM Medical Record Number: [id number] Patient Account Number: [id number] Date of Birth/Sex: Treating RN: 01-15-1971 (49 y.o. [name] Primary Care Conan Mcmanaway: [name] Other Clinician: Referring Kyndra Condron: Treating Reilly Blades/Extender: Aurora Mask in Treatment: 11 Active  Problems Location of Pain Severity and Description of Pain Patient Has Paino No Site Locations Rate the pain. Current Pain Level: 0 Pain Management and Medication Current Pain Management: Electronic Signature(s) Signed: 11/05/2020 4:55:24 PM By: Baruch Gouty RN, BSN Entered By: Baruch Gouty on 11/05/2020 12:48:23 -------------------------------------------------------------------------------- Patient/Caregiver Education Details Patient Name: Date of Service: [name] [name] 12/14/2021andnbsp12:30 PM Medical Record Number: [id number] Patient Account Number: [id number] Date of Birth/Gender: Treating RN: [birth year] (49 y.o. [name] Primary Care Physician: [name] Other Clinician: Referring Physician: Treating Physician/Extender: Aurora Mask in Treatment: 11 Education Assessment Education Provided To: Patient Education Topics Provided Wound/Skin Impairment: Methods: Explain/Verbal Responses: State content correctly [employer]) Signed: 11/06/2020 9:59:17 AM By: Rhae Hammock RN Entered By: Rhae Hammock on 11/05/2020 12:52:01 -------------------------------------------------------------------------------- Wound Assessment Details Patient Name: Date of Service: [name] [name], [name] 11/05/2020 12:30 PM Medical Record Number: [id number] Patient Account Number: [id number] Date of Birth/Sex: Treating RN: Mar 01, 1971 (49 y.o. [name] Primary Care Casyn Becvar: [name] Other Clinician: Referring Lismary Kiehn: Treating Evadene Wardrip/Extender: Aurora Mask in Treatment: 11 Wound Status Wound Number: 2R Primary Diabetic Wound/Ulcer of the Lower Extremity Etiology: Wound Location: Left Metatarsal head first Wound Status: Open Wounding Event: Blister Comorbid Anemia, Hypertension, Peripheral Venous Disease, Type II Date Acquired: 06/23/2020 History: Diabetes Weeks Of  Treatment: 11 Clustered Wound: No Wound Measurements Length: (cm) 0.7 Width: (cm) 0.7 Depth: (cm) 0.1  Area: (cm) 0.385 Volume: (cm) 0.038 % Reduction in Area: -1141.9% % Reduction in Volume: -1166.7% Epithelialization: None Undermining: Yes Starting Position (o'clock): 6 Ending Position (o'clock): 11 Maximum Distance: (cm) 0.4 Wound Description Classification: Grade 2 Wound Margin: Thickened Exudate Amount: Small Exudate Type: Serosanguineous Exudate Color: red, brown Foul Odor After Cleansing: No Slough/Fibrino Yes Wound Bed Granulation Amount: Large (67-100%) Exposed Structure Granulation Quality: Pink, Pale Fascia Exposed: No Necrotic Amount: None Present (0%) Fat Layer (Subcutaneous Tissue) Exposed: Yes Tendon Exposed: No Muscle Exposed: No Joint Exposed: No Bone Exposed: No Treatment Notes Wound #2R (Metatarsal head first) Wound Laterality: Left Cleanser Wound Cleanser Discharge Instruction: Cleanse the wound with wound cleanser prior to applying a clean dressing using gauze sponges, not tissue or cotton balls. Peri-Wound Care Sween Lotion (Moisturizing lotion) Discharge Instruction: Apply moisturizing lotion as directed Topical Primary Dressing PolyMem Silver Non-Adhesive Dressing, 4.25x4.25 in Discharge Instruction: Apply to wound bed as instructed Secondary Dressing Woven Gauze Sponge, Non-Sterile 4x4 in Discharge Instruction: Apply over primary dressing as directed. Secured With [employer], 4.5x3.1 (in/yd) Discharge Instruction: Secure with Kerlix as directed. Compression Wrap Compression Stockings Add-Ons Notes educated patient on new primary dressing. patient in agreement. Electronic Signature(s) Signed: 11/05/2020 4:55:24 PM By: Baruch Gouty RN, BSN Entered By: Baruch Gouty on 11/05/2020 12:49:28 -------------------------------------------------------------------------------- Bath Details Patient Name: Date of  Service: [name] [name], [name] 11/05/2020 12:30 PM Medical Record Number: [id number] Patient Account Number: [id number] Date of Birth/Sex: Treating RN: 27-Jan-1971 (49 y.o. [name] Primary Care Andyn Sales: [name] Other Clinician: Referring Aanshi Batchelder: Treating Cherise Fedder/Extender: Aurora Mask in Treatment: 11 Vital Signs Time Taken: 12:43 Temperature (F): 98.4 Height (in): 77 Pulse (bpm): 96 Source: Stated Respiratory Rate (breaths/min): 18 Weight (lbs): 232 Blood Pressure (mmHg): 151/90 Source: Stated Capillary Blood Glucose (mg/dl): 137 Body Mass Index (BMI): 27.5 Reference Range: 80 - 120 mg / dl Notes glucose per pt report last night Electronic Signature(s) Signed: 11/05/2020 4:55:24 PM By: Baruch Gouty RN, BSN Entered By: Baruch Gouty on 11/05/2020 12:43:51

## 2020-11-19 ENCOUNTER — Encounter (HOSPITAL_BASED_OUTPATIENT_CLINIC_OR_DEPARTMENT_OTHER): Payer: Medicare Other | Admitting: Internal Medicine

## 2020-11-19 ENCOUNTER — Other Ambulatory Visit: Payer: Self-pay

## 2020-11-19 DIAGNOSIS — E11621 Type 2 diabetes mellitus with foot ulcer: Secondary | ICD-10-CM | POA: Diagnosis not present

## 2020-11-20 NOTE — Progress Notes (Signed)
[name] [name], [name] [name] ([id number]) Visit Report for 11/19/2020 Arrival Information Details Patient Name: Date of Service: [name] [name] 11/19/2020 12:30 PM Medical Record Number: [id number] Patient Account Number: [id number] Date of Birth/Sex: Treating RN: 1971/10/10 (49 y.o. [name] [name], [name] [name] Primary Care Danesha Kirchoff: [name] Other Clinician: Referring Kitiara Hintze: Treating Channin Agustin/Extender: Aurora Mask in Treatment: 64 Visit Information History Since Last Visit Added or deleted any medications: No Patient Arrived: Ambulatory Any new allergies or adverse reactions: No Arrival Time: 12:41 Had a fall or experienced change in No Accompanied By: self activities of daily living that may affect Transfer Assistance: None risk of falls: Patient Identification Verified: Yes Signs or symptoms of abuse/neglect since last visito No Secondary Verification Process Completed: Yes Hospitalized since last visit: No Patient Requires Transmission-Based Precautions: No Implantable device outside of the clinic excluding No Patient Has Alerts: No cellular tissue based products placed in the center since last visit: Has Dressing in Place as Prescribed: Yes Pain Present Now: No Electronic Signature(s) Signed: 11/19/2020 12:52:01 PM By: Sandre Kitty Entered By: Sandre Kitty on 11/19/2020 12:41:42 -------------------------------------------------------------------------------- Encounter Discharge Information Details Patient Name: Date of Service: [name] [name], [name] [name] L. 11/19/2020 12:30 PM Medical Record Number: [id number] Patient Account Number: [id number] Date of Birth/Sex: Treating RN: Mar 03, 1971 (49 y.o. [name] [name] Primary Care Larenz Frasier: [name] Other Clinician: Referring Nasiah Polinsky: Treating Itxel Wickard/Extender: Aurora Mask in Treatment: 13 Encounter Discharge Information Items Post Procedure Vitals Discharge  Condition: Stable Temperature (F): 98.5 Ambulatory Status: Ambulatory Pulse (bpm): 91 Discharge Destination: Home Respiratory Rate (breaths/min): 18 Transportation: Private Auto Blood Pressure (mmHg): 121/85 Accompanied By: self Schedule Follow-up Appointment: Yes Clinical Summary of Care: Patient Declined Electronic Signature(s) Signed: 11/19/2020 4:33:01 PM By: Mikeal Hawthorne EMT/HBOT/SD Entered By: Mikeal Hawthorne on 11/19/2020 13:24:33 -------------------------------------------------------------------------------- Lower Extremity Assessment Details Patient Name: Date of Service: [name] [name], [name] [name] L. 11/19/2020 12:30 PM Medical Record Number: [id number] Patient Account Number: [id number] Date of Birth/Sex: Treating RN: [birth year] (49 y.o. [name] [name] Primary Care Aziza Stuckert: [name] Other Clinician: Referring Gratia Disla: Treating Nakeia Calvi/Extender: Aurora Mask in Treatment: 13 Edema Assessment Assessed: [name]: No] [name]: No] Edema: [Left: N] [Right: o] Calf Left: Right: Point of Measurement: 50 cm From Medial Instep 39 cm Ankle Left: Right: Point of Measurement: 11 cm From Medial Instep 25.5 cm Vascular Assessment Pulses: Dorsalis Pedis Palpable: [Left:Yes] Electronic Signature(s) Signed: 11/19/2020 6:35:32 PM By: Baruch Gouty RN, BSN Entered By: Baruch Gouty on 11/19/2020 12:53:10 -------------------------------------------------------------------------------- Multi Wound Chart Details Patient Name: Date of Service: [name] [name], [name] [name] L. 11/19/2020 12:30 PM Medical Record Number: [id number] Patient Account Number: [id number] Date of Birth/Sex: Treating RN: 1971/03/06 (49 y.o. [name] [name] Primary Care Jaqualyn Juday: [name] Other Clinician: Referring Refujio Haymer: Treating Jiles Goya/Extender: Aurora Mask in Treatment: 13 Vital Signs Height(in): 77 Pulse(bpm): 91 Weight(lbs):  419 Blood Pressure(mmHg): 121/85 Body Mass Index(BMI): 28 Temperature(F): 98.5 Respiratory Rate(breaths/min): 18 Photos: [2R:No Photos Left Metatarsal head first] [N/A:N/A N/A] Wound Location: [2R:Blister] [N/A:N/A] Wounding Event: [2R:Diabetic Wound/Ulcer of the Lower] [N/A:N/A] Primary Etiology: [2R:Extremity Anemia, Hypertension, Peripheral] [N/A:N/A] Comorbid History: [2R:Venous Disease, Type II Diabetes 06/23/2020] [N/A:N/A] Date Acquired: [2R:13] [N/A:N/A] Weeks of Treatment: [2R:Open] [N/A:N/A] Wound Status: [2R:Yes] [N/A:N/A] Wound Recurrence: [2R:0.5x0.5x0.2] [N/A:N/A] Measurements L x W x D (cm) [2R:0.196] [N/A:N/A] A (cm) : rea [2R:0.039] [N/A:N/A] Volume (cm) : [2R:-532.30%] [N/A:N/A] % Reduction in A rea: [2R:-1200.00%] [N/A:N/A] % Reduction in Volume: [2R:11] Starting Position 1 (o'clock): [2R:3] Ending Position 1 (o'clock): [  2R:0.3] Maximum Distance 1 (cm): [2R:Yes] [N/A:N/A] Undermining: [2R:Grade 2] [N/A:N/A] Classification: [2R:Medium] [N/A:N/A] Exudate A mount: [2R:Serosanguineous] [N/A:N/A] Exudate Type: [2R:red, brown] [N/A:N/A] Exudate Color: [2R:Thickened] [N/A:N/A] Wound Margin: [2R:Large (67-100%)] [N/A:N/A] Granulation A mount: [2R:Red] [N/A:N/A] Granulation Quality: [2R:Small (1-33%)] [N/A:N/A] Necrotic A mount: [2R:Fat Layer (Subcutaneous Tissue): Yes N/A] Exposed Structures: [2R:Fascia: No Tendon: No Muscle: No Joint: No Bone: No None] [N/A:N/A] Epithelialization: [2R:Debridement - Excisional] [N/A:N/A] Debridement: Pre-procedure Verification/Time Out 12:55 [N/A:N/A] Taken: [2R:Lidocaine 5% topical ointment] [N/A:N/A] Pain Control: [2R:Callus, Subcutaneous, Slough] [N/A:N/A] Tissue Debrided: [2R:Skin/Subcutaneous Tissue] [N/A:N/A] Level: [2R:1.44] [N/A:N/A] Debridement A (sq cm): [2R:rea Curette] [N/A:N/A] Instrument: [2R:Minimum] [N/A:N/A] Bleeding: [2R:Pressure] [N/A:N/A] Hemostasis A chieved: [2R:0] [N/A:N/A] Procedural Pain: [2R:0]  [N/A:N/A] Post Procedural Pain: [2R:Procedure was tolerated well] [N/A:N/A] Debridement Treatment Response: [2R:1x0.7x0.2] [N/A:N/A] Post Debridement Measurements L x W x D (cm) [2R:0.11] [N/A:N/A] Post Debridement Volume: (cm) [2R:Debridement] [N/A:N/A] Treatment Notes Electronic Signature(s) Signed: 11/19/2020 6:35:32 PM By: Baruch Gouty RN, BSN Signed: 11/20/2020 12:07:59 PM By: Linton Ham MD Entered By: Linton Ham on 11/19/2020 13:06:22 -------------------------------------------------------------------------------- Multi-Disciplinary Care Plan Details Patient Name: Date of Service: [name] [name], [name] [name] L. 11/19/2020 12:30 PM Medical Record Number: [id number] Patient Account Number: [id number] Date of Birth/Sex: Treating RN: Apr 13, 1971 (48 y.o. [name] [name] Primary Care Kaysen Deal: [name] Other Clinician: Referring Bart Ashford: Treating Erlene Devita/Extender: Aurora Mask in Treatment: 13 Active Inactive Wound/Skin Impairment Nursing Diagnoses: Impaired tissue integrity Goals: Patient/caregiver will verbalize understanding of skin care regimen Date Initiated: 11/19/2020 Target Resolution Date: 12/17/2020 Goal Status: Active Ulcer/skin breakdown will have a volume reduction of 30% by week 4 Date Initiated: 08/16/2020 Target Resolution Date: 12/19/2020 Goal Status: Active Interventions: Provide education on ulcer and skin care Notes: Electronic Signature(s) Signed: 11/19/2020 6:35:32 PM By: Baruch Gouty RN, BSN Entered By: Baruch Gouty on 11/19/2020 12:54:17 -------------------------------------------------------------------------------- Pain Assessment Details Patient Name: Date of Service: [name] [name], [name] [name] L. 11/19/2020 12:30 PM Medical Record Number: [id number] Patient Account Number: [id number] Date of Birth/Sex: Treating RN: [birth year] (49 y.o. [name] [name] Primary Care Christyn Gutkowski: [name] Other  Clinician: Referring Dub Maclellan: Treating Meg Niemeier/Extender: Aurora Mask in Treatment: 13 Active Problems Location of Pain Severity and Description of Pain Patient Has Paino No Site Locations Pain Management and Medication Current Pain Management: Electronic Signature(s) Signed: 11/19/2020 12:52:01 PM By: Sandre Kitty Signed: 11/19/2020 6:35:32 PM By: Baruch Gouty RN, BSN Entered By: Sandre Kitty on 11/19/2020 12:42:11 -------------------------------------------------------------------------------- Patient/Caregiver Education Details Patient Name: Date of Service: [name] [name] 12/28/2021andnbsp12:30 PM Medical Record Number: [id number] Patient Account Number: [id number] Date of Birth/Gender: Treating RN: 1971/10/08 (49 y.o. [name] [name] Primary Care Physician: [name] Other Clinician: Referring Physician: Treating Physician/Extender: Aurora Mask in Treatment: 13 Education Assessment Education Provided To: Patient Education Topics Provided Offloading: Methods: Explain/Verbal Responses: Reinforcements needed, State content correctly Wound/Skin Impairment: Methods: Explain/Verbal Responses: Reinforcements needed, State content correctly Electronic Signature(s) Signed: 11/19/2020 6:35:32 PM By: Baruch Gouty RN, BSN Entered By: Baruch Gouty on 11/19/2020 12:54:50 -------------------------------------------------------------------------------- Wound Assessment Details Patient Name: Date of Service: [name] [name], [name] [name] L. 11/19/2020 12:30 PM Medical Record Number: [id number] Patient Account Number: [id number] Date of Birth/Sex: Treating RN: 05-Feb-1971 (49 y.o. [name] [name] Primary Care Lina Hitch: [name] Other Clinician: Referring Gerardo Territo: Treating Gabriele Zwilling/Extender: Aurora Mask in Treatment: 13 Wound Status Wound Number: 2R Primary  Diabetic Wound/Ulcer of the Lower Extremity Etiology: Wound Location: Left Metatarsal head first Wound Status: Open Wounding Event: Blister Comorbid Anemia, Hypertension, Peripheral  Venous Disease, Type II Date Acquired: 06/23/2020 History: Diabetes Weeks Of Treatment: 13 Clustered Wound: No Wound Measurements Length: (cm) 0.5 Width: (cm) 0.5 Depth: (cm) 0.2 Area: (cm) 0.196 Volume: (cm) 0.039 % Reduction in Area: -532.3% % Reduction in Volume: -1200% Epithelialization: None Tunneling: No Undermining: Yes Starting Position (o'clock): 11 Ending Position (o'clock): 3 Maximum Distance: (cm) 0.3 Wound Description Classification: Grade 2 Wound Margin: Thickened Exudate Amount: Medium Exudate Type: Serosanguineous Exudate Color: red, brown Foul Odor After Cleansing: No Slough/Fibrino Yes Wound Bed Granulation Amount: Large (67-100%) Exposed Structure Granulation Quality: Red Fascia Exposed: No Necrotic Amount: Small (1-33%) Fat Layer (Subcutaneous Tissue) Exposed: Yes Necrotic Quality: Adherent Slough Tendon Exposed: No Muscle Exposed: No Joint Exposed: No Bone Exposed: No Treatment Notes Wound #2R (Metatarsal head first) Wound Laterality: Left Cleanser Wound Cleanser Discharge Instruction: Cleanse the wound with wound cleanser prior to applying a clean dressing using gauze sponges, not tissue or cotton balls. Peri-Wound Care Sween Lotion (Moisturizing lotion) Discharge Instruction: Apply moisturizing lotion as directed Topical Primary Dressing PolyMem Silver Non-Adhesive Dressing, 4.25x4.25 in Discharge Instruction: Apply to wound bed as instructed Secondary Dressing Woven Gauze Sponges 2x2 in Discharge Instruction: Apply over primary dressing as directed. Optifoam Non-Adhesive Dressing, 4x4 in Discharge Instruction: Apply over primary dressing cut to make foam donut to help offload Secured With Conforming Stretch Gauze Bandage, Sterile 2x75 (in/in) Discharge  Instruction: Secure with stretch gauze as directed. Compression Wrap Compression Stockings Add-Ons Electronic Signature(s) Signed: 11/19/2020 6:35:32 PM By: Baruch Gouty RN, BSN Previous Signature: 11/19/2020 12:52:01 PM Version By: Sandre Kitty Entered By: Baruch Gouty on 11/19/2020 12:53:48 -------------------------------------------------------------------------------- Vitals Details Patient Name: Date of Service: [name] [name], [name] [name] L. 11/19/2020 12:30 PM Medical Record Number: [id number] Patient Account Number: [id number] Date of Birth/Sex: Treating RN: 1970/12/25 (49 y.o. [name] [name] Primary Care Miran Kautzman: [name] Other Clinician: Referring Javonnie Illescas: Treating Morse Brueggemann/Extender: Aurora Mask in Treatment: 13 Vital Signs Time Taken: 12:41 Temperature (F): 98.5 Height (in): 77 Pulse (bpm): 91 Weight (lbs): 232 Respiratory Rate (breaths/min): 18 Body Mass Index (BMI): 27.5 Blood Pressure (mmHg): 121/85 Reference Range: 80 - 120 mg / dl Electronic Signature(s) Signed: 11/19/2020 12:52:01 PM By: Sandre Kitty Entered By: Sandre Kitty on 11/19/2020 12:42:04

## 2020-11-20 NOTE — Progress Notes (Signed)
[name] [name], [name] [name] ([id number]) Visit Report for 11/19/2020 Debridement Details Patient Name: Date of Service: [name] [name] 11/19/2020 12:30 PM Medical Record Number: [id number] Patient Account Number: [id number] Date of Birth/Sex: Treating RN: Dec 28, 1970 (49 y.o. [name] [name] Primary Care Provider: [name] Other Clinician: Referring Provider: Treating Provider/Extender: Aurora Mask in Treatment: 13 Debridement Performed for Assessment: Wound #2R Left Metatarsal head first Performed By: Physician Ricard Dillon., MD Debridement Type: Debridement Severity of Tissue Pre Debridement: Fat layer exposed Level of Consciousness (Pre-procedure): Awake and Alert Pre-procedure Verification/Time Out Yes - 12:55 Taken: Start Time: 12:58 Pain Control: Lidocaine 5% topical ointment T Area Debrided (L x W): otal 1.2 (cm) x 1.2 (cm) = 1.44 (cm) Tissue and other material debrided: Viable, Non-Viable, Callus, Slough, Subcutaneous, Skin: Epidermis, Slough Level: Skin/Subcutaneous Tissue Debridement Description: Excisional Instrument: Curette Bleeding: Minimum Hemostasis Achieved: Pressure End Time: 13:00 Procedural Pain: 0 Post Procedural Pain: 0 Response to Treatment: Procedure was tolerated well Level of Consciousness (Post- Awake and Alert procedure): Post Debridement Measurements of Total Wound Length: (cm) 1 Width: (cm) 0.7 Depth: (cm) 0.2 Volume: (cm) 0.11 Character of Wound/Ulcer Post Debridement: Improved Severity of Tissue Post Debridement: Fat layer exposed Post Procedure Diagnosis Same as Pre-procedure Electronic Signature(s) Signed: 11/19/2020 6:35:32 PM By: Baruch Gouty RN, BSN Signed: 11/20/2020 12:07:59 PM By: Linton Ham MD Entered By: Linton Ham on 11/19/2020 13:06:31 -------------------------------------------------------------------------------- HPI Details Patient Name: Date of Service: [name] [name]  ND, [name] 11/19/2020 12:30 PM Medical Record Number: [id number] Patient Account Number: [id number] Date of Birth/Sex: Treating RN: 09-20-1971 (49 y.o. [name] [name] Primary Care Provider: [name] Other Clinician: Referring Provider: Treating Provider/Extender: Aurora Mask in Treatment: 80 History of Present Illness HPI Description: 04/21/18 ADMISSION This is a 49 year old man who is a type II diabetic. In spite of fact his hemoglobin A1c is actually quite good 5.73 months ago he is at a really difficult time over the last 6-7 months. He developed a rapidly progressive infection in the right foot in November 2018 associated with osteomyelitis and necrotizing fasciitis. 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 The stump required and a revision on 11/24/17. The stump revision was advertised as being secondary to falls. I'm not sure the progressive history here however this area is actually closed over. The patient tells me over the same timeframe he has had wounds on the plantar aspect of his left foot. In the ED saw Dr. Sharol Given in April of this year. 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 It is really not clear that this patient is been dressing this with anything. He came in with the clinic without any specific dressing on the wound areas using his own tennis shoe. He tells me that he only ambulates of course to do a pivot transfer. He does not have his prosthesis for the right leg as of yet and he blames Medicaid for this. He does however use the foot to push himself along in his wheelchair at home. The patient has not had formal arterial studies. ABI in our clinic on the left was 1.13. Patient's past medical history includes type 2 diabetes, fracture of the right fibula. I see that he was treated for abscesses on his right buttock and chest in 2016. I did not look at the microbiology of this. 04/28/18; patient  comes back in the clinic today with the wound pretty much the same  as when he came in here last week. Small opening lots of undermining relatively. He tells [place] that nothing is really been on this for 3 days in spite of the fact that we gave him enough to dress this easily especially such a small wound. He says he lives with his mother, she is not capable of assisting with this he is changing the dressings himself. 05/05/18; much better-looking wound today. Smaller. There is some undermining medially however that's only perhaps 2 mm. No surrounding erythema. We've been using silver collagen 05/12/18; small wound on the first metatarsal head. No undermining no surrounding erythema. We've been using silver collagen he has home health coming out to change 05/20/18 the wound on the first metatarsal head looks better. Covered in surface debris/callus nonviable tissue. Required debridement but post debridement this looks quite good. We've been using silver collagen. He has home health 05/27/18; first metatarsal head wound continues to improve. Just about completely closed. Still a lot of surface debridement callus. We've been using silver collagen 06/06/18; the first metatarsal wound is completely healed over. There is still a lot of callus. I gently removed some of this just to make sure that there was no open area and there is not. The patient mentions to me that his left leg has felt like "lead" for about a week READMISSION 08/16/2020 This is a 49 year old man with type 2 diabetes. He returns to clinic today with a 1 month history of a blister and callus over the first metatarsal head. This is in exactly the same area as when he was here in 2019. 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 He has standard running shoes on the left foot to match the area in his prosthesis. He has only been covering this area with a Band-Aid has not really been specifically dressing  this. ABI in our clinic on the left was 1.16. This is essentially stable from the value in 2019 10/1 the patient has a linear wound over the left first metatarsal head. Again not a lot of callus thick skin around this that I removed with a #3 curette. This is not go deep to bone or muscle it does not look to be infected. 10/8 first metatarsal head. The wound looks like it is closing however this is going to be a difficult area to offload in the future. He has a size 15 shoe and he says his shoes are years old. The inserts in these current shoes are totally useless and I have advised him to replace these. He may need a new pair of shoes and I have he got original prosthesis [facility] [employer] [place] I have asked him to go back there. 11/9; the patient has not been here in more than a month. Not sure he was healed when he was here the last time. I told him he would have to get new shoes and certainly offload the area over the left first metatarsal head. He has done nothing of the kind. He arrives back in clinic with the same old new balance sneakers. A very large separating callus over the first metatarsal head on the left 11/16; he has purchased new shoes and has at least 2 insoles like I asked. The wound is measuring much smaller. He is using silver alginate he changes the dressing himself 11/30; wound is measurably smaller in length of about a half a centimeter. This is generally very little  depth. We have been using silver alginate. He changes the dressing himself every second day. 12/14; wound is about the same size. Significant undermining medially relative to the size of the wound. We have been using silver alginate. There is not a way to offload this area as he walks with a prosthesis on the right leg 12/28; wound is about the same size. Again he has undermining medially. I am using polymen on this Electronic Signature(s) Signed: 11/20/2020 12:07:59 PM By: Linton Ham MD Entered By:  Linton Ham on 11/19/2020 13:07:07 -------------------------------------------------------------------------------- Physical Exam Details Patient Name: Date of Service: [name] [name] ND, [name] 11/19/2020 12:30 PM Medical Record Number: [id number] Patient Account Number: [id number] Date of Birth/Sex: Treating RN: [birth year] (49 y.o. [name] [name] Primary Care Provider: [name] Other Clinician: Referring Provider: Treating Provider/Extender: Aurora Mask in Treatment: 13 Constitutional Sitting or standing Blood Pressure is within target range for patient.. Pulse regular and within target range for patient.Marland Kitchen Respirations regular, non-labored and within target range.. Temperature is normal and within the target range for the patient.Marland Kitchen Appears in no distress. Notes Wound exam; left plantar first metatarsal head. Undermining noted again I remove this with a #3 curette. Some debris on the wound surface removed with a #3 curette hemostasis with direct pressure. Post debridement there is a very clean healthy surface to this wound edges nicely adherent to the wound Electronic Signature(s) Signed: 11/20/2020 12:07:59 PM By: Linton Ham MD Entered By: Linton Ham on 11/19/2020 13:08:27 -------------------------------------------------------------------------------- Physician Orders Details Patient Name: Date of Service: [name] [name] ND, [name] 11/19/2020 12:30 PM Medical Record Number: [id number] Patient Account Number: [id number] Date of Birth/Sex: Treating RN: [birth year] (49 y.o. [name] [name] Primary Care Provider: [name] Other Clinician: Referring Provider: Treating Provider/Extender: Aurora Mask in Treatment: 56 Verbal / Phone Orders: No Diagnosis Coding ICD-10 Coding Code Description E11.621 Type 2 diabetes mellitus with foot ulcer L97.522 Non-pressure chronic ulcer of other part of left foot with  fat layer exposed E11.42 Type 2 diabetes mellitus with diabetic polyneuropathy Z89.511 Acquired absence of right leg below knee Follow-up Appointments Return Appointment in 2 weeks. Bathing/ Shower/ Hygiene May shower and wash wound with soap and water. - WITH DRESSING CHANGES. Edema Control - Lymphedema / SCD / Other Elevate legs to the level of the heart or above for 30 minutes daily and/or when sitting, a frequency of: Avoid standing for long periods of time. Wound Treatment Wound #2R - Metatarsal head first Wound Laterality: Left Cleanser: Wound Cleanser Every Other Day/15 Days Discharge Instructions: Cleanse the wound with wound cleanser prior to applying a clean dressing using gauze sponges, not tissue or cotton balls. Peri-Wound Care: Sween Lotion (Moisturizing lotion) Every Other Day/15 Days Discharge Instructions: Apply moisturizing lotion as directed Prim Dressing: PolyMem Silver Non-Adhesive Dressing, 4.25x4.25 in Every Other Day/15 Days ary Discharge Instructions: Apply to wound bed as instructed Secondary Dressing: Woven Gauze Sponges 2x2 in Every Other Day/15 Days Discharge Instructions: Apply over primary dressing as directed. Secondary Dressing: Optifoam Non-Adhesive Dressing, 4x4 in Every Other Day/15 Days Discharge Instructions: Apply over primary dressing cut to make foam donut to help offload Secured With: Conforming Stretch Gauze Bandage, Sterile 2x75 (in/in) Every Other Day/15 Days Discharge Instructions: Secure with stretch gauze as directed. Electronic Signature(s) Signed: 11/19/2020 6:35:32 PM By: Baruch Gouty RN, BSN Signed: 11/20/2020 12:07:59 PM By: Linton Ham MD Entered By: Baruch Gouty on 11/19/2020 13:03:47 -------------------------------------------------------------------------------- Problem List Details Patient Name: Date of Service: [facility]  ND, [name] 11/19/2020 12:30 PM Medical Record Number: [id number] Patient Account Number:  [id number] Date of Birth/Sex: Treating RN: 1971/09/29 (48 y.o. [name] [name] Primary Care Provider: [name] Other Clinician: Referring Provider: Treating Provider/Extender: Aurora Mask in Treatment: 13 Active Problems ICD-10 Encounter Code Description Active Date MDM Diagnosis E11.621 Type 2 diabetes mellitus with foot ulcer 08/16/2020 No Yes L97.522 Non-pressure chronic ulcer of other part of left foot with fat layer exposed 08/16/2020 No Yes E11.42 Type 2 diabetes mellitus with diabetic polyneuropathy 08/16/2020 No Yes Z89.511 Acquired absence of right leg below knee 08/16/2020 No Yes Inactive Problems Resolved Problems Electronic Signature(s) Signed: 11/20/2020 12:07:59 PM By: Linton Ham MD Entered By: Linton Ham on 11/19/2020 13:06:05 -------------------------------------------------------------------------------- Progress Note Details Patient Name: Date of Service: [name] [name] ND, [name] 11/19/2020 12:30 PM Medical Record Number: [id number] Patient Account Number: [id number] Date of Birth/Sex: Treating RN: 09-18-1971 (49 y.o. [name] [name] Primary Care Provider: [name] Other Clinician: Referring Provider: Treating Provider/Extender: Aurora Mask in Treatment: 13 Subjective History of Present Illness (HPI) 04/21/18 ADMISSION This is a 49 year old man who is a type II diabetic. In spite of fact his hemoglobin A1c is actually quite good 5.73 months ago he is at a really difficult time over the last 6-7 months. He developed a rapidly progressive infection in the right foot in November 2018 associated with osteomyelitis and necrotizing fasciitis. 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 The stump required and a revision on 11/24/17. The stump revision was advertised as being secondary to falls. I'm not sure the progressive history here however this area is actually closed over. The patient tells me over  the same timeframe he has had wounds on the plantar aspect of his left foot. In the ED saw Dr. Sharol Given in April of this year. 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 It is really not clear that this patient is been dressing this with anything. He came in with the clinic without any specific dressing on the wound areas using his own tennis shoe. He tells me that he only ambulates of course to do a pivot transfer. He does not have his prosthesis for the right leg as of yet and he blames Medicaid for this. He does however use the foot to push himself along in his wheelchair at home. The patient has not had formal arterial studies. ABI in our clinic on the left was 1.13. Patient's past medical history includes type 2 diabetes, fracture of the right fibula. I see that he was treated for abscesses on his right buttock and chest in 2016. I did not look at the microbiology of this. 04/28/18; patient comes back in the clinic today with the wound pretty much the same as when he came in here last week. Small opening lots of undermining relatively. He tells [place] that nothing is really been on this for 3 days in spite of the fact that we gave him enough to dress this easily especially such a small wound. He says he lives with his mother, she is not capable of assisting with this he is changing the dressings himself. 05/05/18; much better-looking wound today. Smaller. There is some undermining medially however that's only perhaps 2 mm. No surrounding erythema. We've been using silver collagen 05/12/18; small wound on the first metatarsal head. No undermining no surrounding erythema. We've been using silver collagen he has home health coming  out to change 05/20/18 the wound on the first metatarsal head looks better. Covered in surface debris/callus nonviable tissue. Required debridement but post debridement this looks quite good. We've been using silver collagen. He has home  health 05/27/18; first metatarsal head wound continues to improve. Just about completely closed. Still a lot of surface debridement callus. We've been using silver collagen 06/06/18; the first metatarsal wound is completely healed over. There is still a lot of callus. I gently removed some of this just to make sure that there was no open area and there is not. The patient mentions to me that his left leg has felt like "lead" for about a week READMISSION 08/16/2020 This is a 49 year old man with type 2 diabetes. He returns to clinic today with a 1 month history of a blister and callus over the first metatarsal head. This is in exactly the same area as when he was here in 2019. 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 He has standard running shoes on the left foot to match the area in his prosthesis. He has only been covering this area with a Band-Aid has not really been specifically dressing this. ABI in our clinic on the left was 1.16. This is essentially stable from the value in 2019 10/1 the patient has a linear wound over the left first metatarsal head. Again not a lot of callus thick skin around this that I removed with a #3 curette. This is not go deep to bone or muscle it does not look to be infected. 10/8 first metatarsal head. The wound looks like it is closing however this is going to be a difficult area to offload in the future. He has a size 15 shoe and he says his shoes are years old. The inserts in these current shoes are totally useless and I have advised him to replace these. He may need a new pair of shoes and I have he got original prosthesis at [employer] [place] I have asked him to go back there. 11/9; the patient has not been here in more than a month. Not sure he was healed when he was here the last time. I told him he would have to get new shoes and certainly offload the area over the left first metatarsal head. He has done nothing  of the kind. He arrives back in clinic with the same old new balance sneakers. A very large separating callus over the first metatarsal head on the left 11/16; he has purchased new shoes and has at least 2 insoles like I asked. The wound is measuring much smaller. He is using silver alginate he changes the dressing himself 11/30; wound is measurably smaller in length of about a half a centimeter. This is generally very little depth. We have been using silver alginate. He changes the dressing himself every second day. 12/14; wound is about the same size. Significant undermining medially relative to the size of the wound. We have been using silver alginate. There is not a way to offload this area as he walks with a prosthesis on the right leg 12/28; wound is about the same size. Again he has undermining medially. I am using polymen on this Objective Constitutional Sitting or standing Blood Pressure is within target range for patient.. Pulse regular and within target range for patient.Marland Kitchen Respirations regular, non-labored and within target range.. Temperature is normal and within the target range for the patient.Marland Kitchen  Appears in no distress. Vitals Time Taken: 12:41 PM, Height: 77 in, Weight: 232 lbs, BMI: 27.5, Temperature: 98.5 F, Pulse: 91 bpm, Respiratory Rate: 18 breaths/min, Blood Pressure: 121/85 mmHg. General Notes: Wound exam; left plantar first metatarsal head. Undermining noted again I remove this with a #3 curette. Some debris on the wound surface removed with a #3 curette hemostasis with direct pressure. Post debridement there is a very clean healthy surface to this wound edges nicely adherent to the wound Integumentary (Hair, Skin) Wound #2R status is Open. Original cause of wound was Blister. The wound is located on the Left Metatarsal head first. The wound measures 0.5cm length x 0.5cm width x 0.2cm depth; 0.196cm^2 area and 0.039cm^3 volume. There is Fat Layer (Subcutaneous Tissue)  exposed. There is no tunneling noted, however, there is undermining starting at 11:00 and ending at 3:00 with a maximum distance of 0.3cm. There is a medium amount of serosanguineous drainage noted. The wound margin is thickened. There is large (67-100%) red granulation within the wound bed. There is a small (1-33%) amount of necrotic tissue within the wound bed including Adherent Slough. 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 There was a Excisional Skin/Subcutaneous Tissue Debridement with a total area of 1.44 sq cm performed by Ricard Dillon., MD. With the following instrument(s): Curette to remove Viable and Non-Viable tissue/material. Material removed includes Callus, Subcutaneous Tissue, Slough, and Skin: Epidermis after achieving pain control using Lidocaine 5% topical ointment. No specimens were taken. A time out was conducted at 12:55, prior to the start of the procedure. A Minimum amount of bleeding was controlled with Pressure. The procedure was tolerated well with a pain level of 0 throughout and a pain level of 0 following the procedure. Post Debridement Measurements: 1cm length x 0.7cm width x 0.2cm depth; 0.11cm^3 volume. Character of Wound/Ulcer Post Debridement is improved. Severity of Tissue Post Debridement is: Fat layer exposed. Post procedure Diagnosis Wound #2R: Same as Pre-Procedure Plan Follow-up Appointments: Return Appointment in 2 weeks. Bathing/ Shower/ Hygiene: May shower and wash wound with soap and water. - WITH DRESSING CHANGES. Edema Control -  Lymphedema / SCD / Other: Elevate legs to the level of the heart or above for 30 minutes daily and/or when sitting, a frequency of: Avoid standing for long periods of time. WOUND #2R: - Metatarsal head first Wound Laterality: Left Cleanser: Wound Cleanser Every Other Day/15 Days Discharge Instructions: Cleanse the wound with wound cleanser prior to applying a clean dressing using gauze sponges, not tissue or cotton balls. Peri-Wound Care: Sween Lotion (Moisturizing lotion) Every Other Day/15 Days Discharge Instructions: Apply moisturizing lotion as directed Prim Dressing: PolyMem Silver Non-Adhesive Dressing, 4.25x4.25 in Every Other Day/15 Days ary Discharge Instructions: Apply to wound bed as instructed Secondary Dressing: Woven Gauze Sponges 2x2 in Every Other Day/15 Days Discharge Instructions: Apply over primary dressing as directed. Secondary Dressing: Optifoam Non-Adhesive Dressing, 4x4 in Every Other Day/15 Days Discharge Instructions: Apply over primary dressing cut to make foam donut to help offload Secured With: Conforming Stretch Gauze Bandage, Sterile 2x75 (in/in) Every Other Day/15 Days Discharge Instructions: Secure with stretch gauze as directed.  1. I continued with the polymen 2. We will attempt offloading shoe 3. Follow-up in 2 weeks Electronic Signature(s) Signed: 11/20/2020 12:07:59 PM By: Linton Ham MD Entered By: Linton Ham on 11/19/2020 13:09:14 -------------------------------------------------------------------------------- SuperBill Details Patient Name: Date of Service: [name] [name] ND, [name] 11/19/2020 Medical Record Number: [id number] Patient Account Number: [id number] Date of Birth/Sex: Treating RN: 11-27-1970 (49 y.o. [name] [name] Primary Care Provider: [name] Other Clinician: Referring Provider: Treating Provider/Extender: Aurora Mask in Treatment: 13 Diagnosis Coding ICD-10 Codes Code  Description [id number] Type 2 diabetes mellitus with foot ulcer L97.522 Non-pressure chronic ulcer of other part of left foot with fat layer exposed E11.42 Type 2 diabetes mellitus with diabetic polyneuropathy Z89.511 Acquired absence of right leg below knee Facility Procedures CPT4 Code: [id number] Description: 37169 - DEB SUBQ TISSUE 20 SQ CM/< ICD-10 Diagnosis Description L97.522 Non-pressure chronic ulcer of other part of left foot with fat layer exposed Modifier: Quantity: 1 Physician Procedures : CPT4 Code Description Modifier [id number] - WC PHYS SUBQ TISS 20 SQ CM ICD-10 Diagnosis Description L97.522 Non-pressure chronic ulcer of other part of left foot with fat layer exposed Quantity: 1 Electronic Signature(s) Signed: 11/20/2020 12:07:59 PM By: Linton Ham MD Entered By: Linton Ham on 11/19/2020 13:09:27

## 2020-12-02 ENCOUNTER — Other Ambulatory Visit: Payer: Self-pay

## 2020-12-02 ENCOUNTER — Encounter (HOSPITAL_BASED_OUTPATIENT_CLINIC_OR_DEPARTMENT_OTHER): Payer: Medicare Other | Attending: Internal Medicine | Admitting: Internal Medicine

## 2020-12-02 DIAGNOSIS — L97522 Non-pressure chronic ulcer of other part of left foot with fat layer exposed: Secondary | ICD-10-CM | POA: Insufficient documentation

## 2020-12-02 DIAGNOSIS — E11621 Type 2 diabetes mellitus with foot ulcer: Secondary | ICD-10-CM | POA: Insufficient documentation

## 2020-12-02 DIAGNOSIS — Z89511 Acquired absence of right leg below knee: Secondary | ICD-10-CM | POA: Insufficient documentation

## 2020-12-02 DIAGNOSIS — E1142 Type 2 diabetes mellitus with diabetic polyneuropathy: Secondary | ICD-10-CM | POA: Diagnosis not present

## 2020-12-02 DIAGNOSIS — L97529 Non-pressure chronic ulcer of other part of left foot with unspecified severity: Secondary | ICD-10-CM | POA: Diagnosis present

## 2020-12-03 ENCOUNTER — Encounter (HOSPITAL_BASED_OUTPATIENT_CLINIC_OR_DEPARTMENT_OTHER): Payer: Medicare Other | Admitting: Internal Medicine

## 2020-12-05 NOTE — Progress Notes (Signed)
PAYTON, PRINSEN ([id number]) Visit Report for 12/02/2020 Debridement Details Patient Name: Date of Service: [name] [name] 12/02/2020 12:30 PM Medical Record Number: [id number] Patient Account Number: [id number] Date of Birth/Sex: Treating RN: [birth year] (50 y.o. [name] Primary Care Provider: [name] Other Clinician: Referring Provider: Treating Provider/Extender: Aurora Mask in Treatment: 15 Debridement Performed for Assessment: Wound #2R Left Metatarsal head first Performed By: Physician Ricard Dillon., MD Debridement Type: Debridement Severity of Tissue Pre Debridement: Fat layer exposed Level of Consciousness (Pre-procedure): Awake and Alert Pre-procedure Verification/Time Out Yes - 13:20 Taken: Start Time: 13:22 T Area Debrided (L x W): otal 0.8 (cm) x 0.4 (cm) = 0.32 (cm) Tissue and other material debrided: Non-Viable, Callus, Skin: Epidermis Level: Skin/Epidermis Debridement Description: Selective/Open Wound Instrument: Curette Bleeding: Minimum Hemostasis Achieved: Pressure End Time: 13:24 Procedural Pain: 0 Post Procedural Pain: 0 Response to Treatment: Procedure was tolerated well Level of Consciousness (Post- Awake and Alert procedure): Post Debridement Measurements of Total Wound Length: (cm) 0.8 Width: (cm) 0.4 Depth: (cm) 0.2 Volume: (cm) 0.05 Character of Wound/Ulcer Post Debridement: Improved Severity of Tissue Post Debridement: Fat layer exposed Post Procedure Diagnosis Same as Pre-procedure Electronic Signature(s) Signed: 12/05/2020 5:01:02 PM By: Linton Ham MD Signed: 12/05/2020 5:28:50 PM By: Levan Hurst RN, BSN Previous Signature: 12/03/2020 6:04:09 PM Version By: Baruch Gouty RN, BSN Entered By: Linton Ham on 12/04/2020 09:02:25 -------------------------------------------------------------------------------- HPI Details Patient Name: Date of Service: [name] [name], [name] MIE  L. 12/02/2020 12:30 PM Medical Record Number: [id number] Patient Account Number: [id number] Date of Birth/Sex: Treating RN: [birth year] (50 y.o. [name] Primary Care Provider: [name] Other Clinician: Referring Provider: Treating Provider/Extender: Aurora Mask in Treatment: 15 History of Present Illness HPI Description: 04/21/18 ADMISSION This is a 50 year old man who is a type II diabetic. In spite of fact his hemoglobin A1c is actually quite good 5.73 months ago he is at a really difficult time over the last 6-7 months. He developed a rapidly progressive infection in the right foot in November 2018 associated with osteomyelitis and necrotizing fasciitis. 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 The stump required and a revision on 11/24/17. The stump revision was advertised as being secondary to falls. I'm not sure the progressive history here however this area is actually closed over. The patient tells me over the same timeframe he has had wounds on the plantar aspect of his left foot. In the ED saw Dr. Sharol Given in April of this year. 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 It is really not clear that this patient is been dressing this with anything. He came in with the clinic without any specific dressing on the wound areas using his own tennis shoe. He tells me that he only ambulates of course to do a pivot transfer. He does not have his prosthesis for the right leg as of yet and he blames Medicaid for this. He does however use the foot to push himself along in his wheelchair at home. The patient has not had formal arterial studies. ABI in our clinic on the left was 1.13. Patient's past medical history includes type 2 diabetes, fracture of the right fibula. I see that he was treated for abscesses on his right buttock and chest in 2016. I did not look at the microbiology of this. 04/28/18; patient comes back in  the clinic today with the wound pretty much the  same as when he came in here last week. Small opening lots of undermining relatively. He tells [place] that nothing is really been on this for 3 days in spite of the fact that we gave him enough to dress this easily especially such a small wound. He says he lives with his mother, she is not capable of assisting with this he is changing the dressings himself. 05/05/18; much better-looking wound today. Smaller. There is some undermining medially however that's only perhaps 2 mm. No surrounding erythema. We've been using silver collagen 05/12/18; small wound on the first metatarsal head. No undermining no surrounding erythema. We've been using silver collagen he has home health coming out to change 05/20/18 the wound on the first metatarsal head looks better. Covered in surface debris/callus nonviable tissue. Required debridement but post debridement this looks quite good. We've been using silver collagen. He has home health 05/27/18; first metatarsal head wound continues to improve. Just about completely closed. Still a lot of surface debridement callus. We've been using silver collagen 06/06/18; the first metatarsal wound is completely healed over. There is still a lot of callus. I gently removed some of this just to make sure that there was no open area and there is not. The patient mentions to me that his left leg has felt like "lead" for about a week READMISSION 08/16/2020 This is a 50 year old man with type 2 diabetes. He returns to clinic today with a 1 month history of a blister and callus over the first metatarsal head. This is in exactly the same area as when he was here in 2019. 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 He has standard running shoes on the left foot to match the area in his prosthesis. He has only been covering this area with a Band-Aid has not really been specifically dressing this. ABI in  our clinic on the left was 1.16. This is essentially stable from the value in 2019 10/1 the patient has a linear wound over the left first metatarsal head. Again not a lot of callus thick skin around this that I removed with a #3 curette. This is not go deep to bone or muscle it does not look to be infected. 10/8 first metatarsal head. The wound looks like it is closing however this is going to be a difficult area to offload in the future. He has a size 15 shoe and he says his shoes are years old. The inserts in these current shoes are totally useless and I have advised him to replace these. He may need a new pair of shoes and I have he got original prosthesis at [facility] [place] I have asked him to go back there. 11/9; the patient has not been here in more than a month. Not sure he was healed when he was here the last time. I told him he would have to get new shoes and certainly offload the area over the left first metatarsal head. He has done nothing of the kind. He arrives back in clinic with the same old new balance sneakers. A very large separating callus over the first metatarsal head on the left 11/16; he has purchased new shoes and has at least 2 insoles like I asked. The wound is measuring much smaller. He is using silver alginate he changes the dressing himself 11/30; wound is measurably smaller in length of about a half a centimeter. This is generally very  little depth. We have been using silver alginate. He changes the dressing himself every second day. 12/14; wound is about the same size. Significant undermining medially relative to the size of the wound. We have been using silver alginate. There is not a way to offload this area as he walks with a prosthesis on the right leg 12/28; wound is about the same size. Again he has undermining medially. I am using polymen on this 12/02/2020; the wound looks smaller. Still a senescent edge. We have been using polymen Electronic  Signature(s) Signed: 12/05/2020 5:01:02 PM By: Linton Ham MD Entered By: Linton Ham on 12/04/2020 09:02:55 -------------------------------------------------------------------------------- Physical Exam Details Patient Name: Date of Service: [name] [name], [name] 12/02/2020 12:30 PM Medical Record Number: [id number] Patient Account Number: [id number] Date of Birth/Sex: Treating RN: July 10, 1971 (50 y.o. [name] Primary Care Provider: [name] Other Clinician: Referring Provider: Treating Provider/Extender: Aurora Mask in Treatment: 15 Constitutional Sitting or standing Blood Pressure is within target range for patient.. Pulse regular and within target range for patient.Marland Kitchen Respirations regular, non-labored and within target range.. Temperature is normal and within the target range for the patient.Marland Kitchen Appears in no distress. Notes Wound exam; left plantar first metatarsal head. This is much smaller than when he first came in. However the edges around the wound are rolled and senescent with callus and skin. I used a #3 curette to knock this down again to level commensurate with the level of the wound. Minimal bleeding. No evidence of infection Electronic Signature(s) Signed: 12/05/2020 5:01:02 PM By: Linton Ham MD Entered By: Linton Ham on 12/04/2020 09:04:14 -------------------------------------------------------------------------------- Physician Orders Details Patient Name: Date of Service: [name] [name], [name] 12/02/2020 12:30 PM Medical Record Number: [id number] Patient Account Number: [id number] Date of Birth/Sex: Treating RN: [birth year] (50 y.o. [name] Primary Care Provider: [name] Other Clinician: Referring Provider: Treating Provider/Extender: Aurora Mask in Treatment: 15 Verbal / Phone Orders: No Diagnosis Coding ICD-10 Coding Code Description E11.621 Type 2 diabetes  mellitus with foot ulcer L97.522 Non-pressure chronic ulcer of other part of left foot with fat layer exposed E11.42 Type 2 diabetes mellitus with diabetic polyneuropathy Z89.511 Acquired absence of right leg below knee Follow-up Appointments Return Appointment in 2 weeks. Bathing/ Shower/ Hygiene May shower and wash wound with soap and water. - WITH DRESSING CHANGES. Edema Control - Lymphedema / SCD / Other Elevate legs to the level of the heart or above for 30 minutes daily and/or when sitting, a frequency of: Avoid standing for long periods of time. Wound Treatment Wound #2R - Metatarsal head first Wound Laterality: Left Cleanser: Wound Cleanser Every Other Day/15 Days Discharge Instructions: Cleanse the wound with wound cleanser prior to applying a clean dressing using gauze sponges, not tissue or cotton balls. Peri-Wound Care: Sween Lotion (Moisturizing lotion) Every Other Day/15 Days Discharge Instructions: Apply moisturizing lotion as directed Prim Dressing: PolyMem Silver Non-Adhesive Dressing, 4.25x4.25 in Every Other Day/15 Days ary Discharge Instructions: Apply to wound bed as instructed Secondary Dressing: Woven Gauze Sponges 2x2 in Every Other Day/15 Days Discharge Instructions: Apply over primary dressing as directed. Secondary Dressing: Optifoam Non-Adhesive Dressing, 4x4 in Every Other Day/15 Days Discharge Instructions: Apply over primary dressing cut to make foam donut to help offload Secured With: Conforming Stretch Gauze Bandage, Sterile 2x75 (in/in) Every Other Day/15 Days Discharge Instructions: Secure with stretch gauze as directed. Electronic Signature(s) Signed: 12/03/2020 6:04:09 PM By: Baruch Gouty RN, BSN Signed: 12/05/2020 5:01:02 PM  By: Linton Ham MD Entered By: Baruch Gouty on 12/02/2020 14:49:08 -------------------------------------------------------------------------------- Problem List Details Patient Name: Date of Service: [name] [name], [name]  [name] 12/02/2020 12:30 PM Medical Record Number: [id number] Patient Account Number: [id number] Date of Birth/Sex: Treating RN: 05-22-1971 (50 y.o. [name] Primary Care Provider: [name] Other Clinician: Referring Provider: Treating Provider/Extender: Aurora Mask in Treatment: 15 Active Problems ICD-10 Encounter Code Description Active Date MDM Diagnosis E11.621 Type 2 diabetes mellitus with foot ulcer 08/16/2020 No Yes L97.522 Non-pressure chronic ulcer of other part of left foot with fat layer exposed 08/16/2020 No Yes E11.42 Type 2 diabetes mellitus with diabetic polyneuropathy 08/16/2020 No Yes Z89.511 Acquired absence of right leg below knee 08/16/2020 No Yes Inactive Problems Resolved Problems Electronic Signature(s) Signed: 12/05/2020 5:01:02 PM By: Linton Ham MD Entered By: Linton Ham on 12/04/2020 09:01:47 -------------------------------------------------------------------------------- Progress Note Details Patient Name: Date of Service: [name] [name], [name] 12/02/2020 12:30 PM Medical Record Number: [id number] Patient Account Number: [id number] Date of Birth/Sex: Treating RN: Aug 08, 1971 (50 y.o. [name] Primary Care Provider: [name] Other Clinician: Referring Provider: Treating Provider/Extender: Aurora Mask in Treatment: 15 Subjective History of Present Illness (HPI) 04/21/18 ADMISSION This is a 50 year old man who is a type II diabetic. In spite of fact his hemoglobin A1c is actually quite good 5.73 months ago he is at a really difficult time over the last 6-7 months. He developed a rapidly progressive infection in the right foot in November 2018 associated with osteomyelitis and necrotizing fasciitis. 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 The stump required and a revision on 11/24/17. The stump revision was advertised as being secondary to falls. I'm not sure the progressive  history here however this area is actually closed over. The patient tells me over the same timeframe he has had wounds on the plantar aspect of his left foot. In the ED saw Dr. Sharol Given in April of this year. 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 It is really not clear that this patient is been dressing this with anything. He came in with the clinic without any specific dressing on the wound areas using his own tennis shoe. He tells me that he only ambulates of course to do a pivot transfer. He does not have his prosthesis for the right leg as of yet and he blames Medicaid for this. He does however use the foot to push himself along in his wheelchair at home. The patient has not had formal arterial studies. ABI in our clinic on the left was 1.13. Patient's past medical history includes type 2 diabetes, fracture of the right fibula. I see that he was treated for abscesses on his right buttock and chest in 2016. I did not look at the microbiology of this. 04/28/18; patient comes back in the clinic today with the wound pretty much the same as when he came in here last week. Small opening lots of undermining relatively. He tells [place] that nothing is really been on this for 3 days in spite of the fact that we gave him enough to dress this easily especially such a small wound. He says he lives with his mother, she is not capable of assisting with this he is changing the dressings himself. 05/05/18; much better-looking wound today. Smaller. There is some undermining medially however that's only perhaps 2 mm. No surrounding erythema. We've been using silver collagen 05/12/18; small  wound on the first metatarsal head. No undermining no surrounding erythema. We've been using silver collagen he has home health coming out to change 05/20/18 the wound on the first metatarsal head looks better. Covered in surface debris/callus nonviable tissue. Required debridement but post  debridement this looks quite good. We've been using silver collagen. He has home health 05/27/18; first metatarsal head wound continues to improve. Just about completely closed. Still a lot of surface debridement callus. We've been using silver collagen 06/06/18; the first metatarsal wound is completely healed over. There is still a lot of callus. I gently removed some of this just to make sure that there was no open area and there is not. The patient mentions to me that his left leg has felt like "lead" for about a week READMISSION 08/16/2020 This is a 50 year old man with type 2 diabetes. He returns to clinic today with a 1 month history of a blister and callus over the first metatarsal head. This is in exactly the same area as when he was here in 2019. 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 He has standard running shoes on the left foot to match the area in his prosthesis. He has only been covering this area with a Band-Aid has not really been specifically dressing this. ABI in our clinic on the left was 1.16. This is essentially stable from the value in 2019 10/1 the patient has a linear wound over the left first metatarsal head. Again not a lot of callus thick skin around this that I removed with a #3 curette. This is not go deep to bone or muscle it does not look to be infected. 10/8 first metatarsal head. The wound looks like it is closing however this is going to be a difficult area to offload in the future. He has a size 15 shoe and he says his shoes are years old. The inserts in these current shoes are totally useless and I have advised him to replace these. He may need a new pair of shoes and I have he got original prosthesis at [employer] [place] I have asked him to go back there. 11/9; the patient has not been here in more than a month. Not sure he was healed when he was here the last time. I told him he would have to get new shoes and  certainly offload the area over the left first metatarsal head. He has done nothing of the kind. He arrives back in clinic with the same old new balance sneakers. A very large separating callus over the first metatarsal head on the left 11/16; he has purchased new shoes and has at least 2 insoles like I asked. The wound is measuring much smaller. He is using silver alginate he changes the dressing himself 11/30; wound is measurably smaller in length of about a half a centimeter. This is generally very little depth. We have been using silver alginate. He changes the dressing himself every second day. 12/14; wound is about the same size. Significant undermining medially relative to the size of the wound. We have been using silver alginate. There is not a way to offload this area as he walks with a prosthesis on the right leg 12/28; wound is about the same size. Again he has undermining medially. I am using polymen on this 12/02/2020; the wound looks smaller. Still a senescent edge. We have been using polymen Objective Constitutional Sitting or  standing Blood Pressure is within target range for patient.. Pulse regular and within target range for patient.Marland Kitchen Respirations regular, non-labored and within target range.. Temperature is normal and within the target range for the patient.Marland Kitchen Appears in no distress. Vitals Time Taken: 12:48 PM, Height: 77 in, Weight: 232 lbs, BMI: 27.5, Temperature: 98.8 F, Pulse: 103 bpm, Respiratory Rate: 16 breaths/min, Blood Pressure: 130/82 mmHg. General Notes: Wound exam; left plantar first metatarsal head. This is much smaller than when he first came in. However the edges around the wound are rolled and senescent with callus and skin. I used a #3 curette to knock this down again to level commensurate with the level of the wound. Minimal bleeding. No evidence of infection Integumentary (Hair, Skin) Wound #2R status is Open. Original cause of wound was Blister. The wound  is located on the Left Metatarsal head first. The wound measures 0.8cm length x 0.4cm width x 0.2cm depth; 0.251cm^2 area and 0.05cm^3 volume. There is Fat Layer (Subcutaneous Tissue) exposed. There is no tunneling noted, however, there is undermining starting at 6:00 and ending at 11:00 with a maximum distance of 0.3cm. There is a medium amount of serosanguineous drainage noted. The wound margin is thickened. There is large (67-100%) pink granulation within the wound bed. There is no necrotic tissue within the wound bed. 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 There was a Selective/Open Wound Skin/Epidermis Debridement with a total area of 0.32 sq cm performed by Ricard Dillon., MD. With the following instrument(s): Curette to remove Non-Viable tissue/material. Material removed includes Callus and Skin: Epidermis and. No specimens were taken. A time out was conducted at 13:20, prior to the start of the procedure. A Minimum amount of bleeding was controlled with Pressure. The procedure was tolerated well with a pain level of 0 throughout and a pain level of 0 following the procedure. Post Debridement Measurements: 0.8cm length x 0.4cm width x 0.2cm depth; 0.05cm^3 volume. Character of Wound/Ulcer Post Debridement is improved. Severity of Tissue Post Debridement is: Fat layer exposed. Post procedure Diagnosis Wound #2R: Same as Pre-Procedure Plan Follow-up Appointments: Return Appointment in 2 weeks. Bathing/ Shower/ Hygiene: May shower and wash wound with soap and water. - WITH  DRESSING CHANGES. Edema Control - Lymphedema / SCD / Other: Elevate legs to the level of the heart or above for 30 minutes daily and/or when sitting, a frequency of: Avoid standing for long periods of time. WOUND #2R: - Metatarsal head first Wound Laterality: Left Cleanser: Wound Cleanser Every Other Day/15 Days Discharge Instructions: Cleanse the wound with wound cleanser prior to applying a clean dressing using gauze sponges, not tissue or cotton balls. Peri-Wound Care: Sween Lotion (Moisturizing lotion) Every Other Day/15 Days Discharge Instructions: Apply moisturizing lotion as directed Prim Dressing: PolyMem Silver Non-Adhesive Dressing, 4.25x4.25 in Every Other Day/15 Days ary Discharge Instructions: Apply to wound bed as instructed Secondary Dressing: Woven Gauze Sponges 2x2 in Every Other Day/15 Days Discharge Instructions: Apply over primary dressing as directed. Secondary Dressing: Optifoam Non-Adhesive Dressing, 4x4 in Every Other Day/15 Days Discharge Instructions: Apply over primary dressing cut to make foam donut to help offload  Secured With: Child psychotherapist, Sterile 2x75 (in/in) Every Other Day/15 Days Discharge Instructions: Secure with stretch gauze as directed. 1. I have continued with the polymen. 2. The wound has done really quite well given the fact that he has a prosthesis on the other leg and really not an option to really offload this wound properly. Nevertheless it seems to have improved Electronic Signature(s) Signed: 12/05/2020 5:01:02 PM By: Linton Ham MD Entered By: Linton Ham on 12/04/2020 09:05:24 -------------------------------------------------------------------------------- SuperBill Details Patient Name: Date of Service: [name] [name], [name] 12/02/2020 Medical Record Number: [id number] Patient Account Number: [id number] Date of Birth/Sex: Treating RN: [birth year] (50 y.o. [name] Primary Care Provider: [name] Other Clinician: Referring Provider: Treating Provider/Extender: Aurora Mask in Treatment: 15 Diagnosis Coding ICD-10 Codes Code Description [phone] Type 2 diabetes mellitus with foot ulcer L97.522 Non-pressure chronic ulcer of other part of left foot with fat layer exposed E11.42 Type 2 diabetes mellitus with diabetic polyneuropathy Z89.511 Acquired absence of right leg below knee Facility Procedures CPT4 Code: [id number] Description: [phone] - DEBRIDE WOUND 1ST 20 SQ CM OR < ICD-10 Diagnosis Description L97.522 Non-pressure chronic ulcer of other part of left foot with fat layer exposed Modifier: Quantity: 1 Physician Procedures : CPT4 Code Description Modifier [id number] - WC PHYS DEBR WO ANESTH 20 SQ CM ICD-10 Diagnosis Description L97.522 Non-pressure chronic ulcer of other part of left foot with fat layer exposed Quantity: 1 Electronic Signature(s) Signed: 12/05/2020 5:01:02 PM By: Linton Ham MD Previous Signature: 12/03/2020 6:04:09 PM Version By: Baruch Gouty RN, BSN Entered By: Linton Ham on 12/04/2020 09:05:41

## 2020-12-05 NOTE — Progress Notes (Signed)
[name], [name] ([id number]) Visit Report for 12/02/2020 Arrival Information Details Patient Name: Date of Service: [name] [name] 12/02/2020 12:30 PM Medical Record Number: [id number] Patient Account Number: [id number] Date of Birth/Sex: Treating RN: 1971-03-03 (50 y.o. [name] Primary Care Cliff Damiani: [name] Other Clinician: Referring Taegan Standage: Treating Graelyn Bihl/Extender: Aurora Mask in Treatment: 15 Visit Information History Since Last Visit Added or deleted any medications: No Patient Arrived: Ambulatory Any new allergies or adverse reactions: No Arrival Time: 12:48 Had a fall or experienced change in No Accompanied By: alone activities of daily living that may affect Transfer Assistance: None risk of falls: Patient Identification Verified: Yes Signs or symptoms of abuse/neglect since last visito No Secondary Verification Process Completed: Yes Hospitalized since last visit: No Patient Requires Transmission-Based Precautions: No Implantable device outside of the clinic excluding No Patient Has Alerts: No cellular tissue based products placed in the center since last visit: Has Dressing in Place as Prescribed: Yes Pain Present Now: No Electronic Signature(s) Signed: 12/02/2020 5:04:19 PM By: Levan Hurst RN, BSN Entered By: Levan Hurst on 12/02/2020 12:48:48 -------------------------------------------------------------------------------- Encounter Discharge Information Details Patient Name: Date of Service: [name], [name] 12/02/2020 12:30 PM Medical Record Number: [id number] Patient Account Number: [id number] Date of Birth/Sex: Treating RN: 04/30/1971 (50 y.o. [name] Primary Care Oracio Galen: [name] Other Clinician: Referring Alta Goding: Treating Braley Luckenbaugh/Extender: Aurora Mask in Treatment: 15 Encounter Discharge Information Items Post Procedure Vitals Discharge  Condition: Stable Temperature (F): 98.8 Ambulatory Status: Ambulatory Pulse (bpm): 103 Discharge Destination: Home Respiratory Rate (breaths/min): 16 Transportation: Private Auto Blood Pressure (mmHg): 130/82 Accompanied By: self Schedule Follow-up Appointment: Yes Clinical Summary of Care: Electronic Signature(s) Signed: 12/02/2020 5:17:21 PM By: Deon Pilling Entered By: Deon Pilling on 12/02/2020 16:38:22 -------------------------------------------------------------------------------- Lower Extremity Assessment Details Patient Name: Date of Service: [name] [place] [name] 12/02/2020 12:30 PM Medical Record Number: [id number] Patient Account Number: [id number] Date of Birth/Sex: Treating RN: [birth year] (50 y.o. [name] Primary Care Dannia Snook: [name] Other Clinician: Referring Cosima Prentiss: Treating Lyndsay Talamante/Extender: Aurora Mask in Treatment: 15 Edema Assessment Assessed: [name]: No] [name]: No] Edema: [Left: N] [Right: o] Calf Left: Right: Point of Measurement: 50 cm From Medial Instep 37.5 cm Ankle Left: Right: Point of Measurement: 11 cm From Medial Instep 27.5 cm Vascular Assessment Pulses: Dorsalis Pedis Palpable: [Left:Yes] Electronic Signature(s) Signed: 12/02/2020 5:04:19 PM By: Levan Hurst RN, BSN Entered By: Levan Hurst on 12/02/2020 12:53:05 -------------------------------------------------------------------------------- Multi Wound Chart Details Patient Name: Date of Service: [name] [place], [name] 12/02/2020 12:30 PM Medical Record Number: [id number] Patient Account Number: [id number] Date of Birth/Sex: Treating RN: 15-Aug-1971 (50 y.o. [name] Primary Care Ashtyn Meland: [name] Other Clinician: Referring Rayquan Amrhein: Treating Leatta Alewine/Extender: Aurora Mask in Treatment: 15 Vital Signs Height(in): 72 Pulse(bpm): 103 Weight(lbs): 270 Blood Pressure(mmHg):  130/82 Body Mass Index(BMI): 28 Temperature(F): 98.8 Respiratory Rate(breaths/min): 16 Photos: [2R:Left Metatarsal head first] [N/A:N/A N/A] Wound Location: [2R:Blister] [N/A:N/A] Wounding Event: [2R:Diabetic Wound/Ulcer of the Lower] [N/A:N/A] Primary Etiology: [2R:Extremity Anemia, Hypertension, Peripheral] [N/A:N/A] Comorbid History: [2R:Venous Disease, Type II Diabetes 06/23/2020] [N/A:N/A] Date Acquired: [2R:15] [N/A:N/A] Weeks of Treatment: [2R:Open] [N/A:N/A] Wound Status: [2R:Yes] [N/A:N/A] Wound Recurrence: [2R:0.8x0.4x0.2] [N/A:N/A] Measurements L x W x D (cm) [2R:0.251] [N/A:N/A] A (cm) : rea [2R:0.05] [N/A:N/A] Volume (cm) : [2R:-709.70%] [N/A:N/A] % Reduction in A [2R:rea: -1566.70%] [N/A:N/A] % Reduction in Volume: [2R:6] Starting Position 1 (o'clock): [2R:11] Ending Position 1 (o'clock): [2R:0.3] Maximum Distance  1 (cm): [2R:Yes] [N/A:N/A] Undermining: [2R:Grade 2] [N/A:N/A] Classification: [2R:Medium] [N/A:N/A] Exudate A mount: [2R:Serosanguineous] [N/A:N/A] Exudate Type: [2R:red, brown] [N/A:N/A] Exudate Color: [2R:Thickened] [N/A:N/A] Wound Margin: [2R:Large (67-100%)] [N/A:N/A] Granulation A mount: [2R:Pink] [N/A:N/A] Granulation Quality: [2R:None Present (0%)] [N/A:N/A] Necrotic A mount: [2R:Fat Layer (Subcutaneous Tissue): Yes N/A] Exposed Structures: [2R:Fascia: No Tendon: No Muscle: No Joint: No Bone: No Small (1-33%)] [N/A:N/A] Epithelialization: [2R:Debridement - Selective/Open Wound N/A] Debridement: Pre-procedure Verification/Time Out 13:20 [N/A:N/A] Taken: [2R:Callus] [N/A:N/A] Tissue Debrided: [2R:Skin/Epidermis] [N/A:N/A] Level: [2R:0.32] [N/A:N/A] Debridement A (sq cm): [2R:rea Curette] [N/A:N/A] Instrument: [2R:Minimum] [N/A:N/A] Bleeding: [2R:Pressure] [N/A:N/A] Hemostasis A chieved: [2R:0] [N/A:N/A] Procedural Pain: [2R:0] [N/A:N/A] Post Procedural Pain: [2R:Procedure was tolerated well] [N/A:N/A] Debridement Treatment Response:  [2R:0.8x0.4x0.2] [N/A:N/A] Post Debridement Measurements L x W x D (cm) [2R:0.05] [N/A:N/A] Post Debridement Volume: (cm) [2R:Debridement] [N/A:N/A] Treatment Notes Wound #2R (Metatarsal head first) Wound Laterality: Left Cleanser Wound Cleanser Discharge Instruction: Cleanse the wound with wound cleanser prior to applying a clean dressing using gauze sponges, not tissue or cotton balls. Peri-Wound Care Sween Lotion (Moisturizing lotion) Discharge Instruction: Apply moisturizing lotion as directed Topical Primary Dressing PolyMem Silver Non-Adhesive Dressing, 4.25x4.25 in Discharge Instruction: Apply to wound bed as instructed Secondary Dressing Woven Gauze Sponges 2x2 in Discharge Instruction: Apply over primary dressing as directed. Optifoam Non-Adhesive Dressing, 4x4 in Discharge Instruction: Apply over primary dressing cut to make foam donut to help offload Secured With Conforming Stretch Gauze Bandage, Sterile 2x75 (in/in) Discharge Instruction: Secure with stretch gauze as directed. Compression Wrap Compression Stockings Add-Ons Electronic Signature(s) Signed: 12/05/2020 5:01:02 PM By: Linton Ham MD Signed: 12/05/2020 5:28:50 PM By: Levan Hurst RN, BSN Previous Signature: 12/02/2020 5:04:19 PM Version By: Levan Hurst RN, BSN Entered By: Linton Ham on 12/04/2020 09:02:09 -------------------------------------------------------------------------------- Multi-Disciplinary Care Plan Details Patient Name: Date of Service: [name] [place], [name] 12/02/2020 12:30 PM Medical Record Number: [id number] Patient Account Number: [id number] Date of Birth/Sex: Treating RN: 1971/08/03 (50 y.o. [name] Primary Care Brinda Focht: [name] Other Clinician: Referring Jamylah Marinaccio: Treating Pleas Carneal/Extender: Aurora Mask in Treatment: 15 Active Inactive Wound/Skin Impairment Nursing Diagnoses: Impaired tissue  integrity Goals: Patient/caregiver will verbalize understanding of skin care regimen Date Initiated: 11/19/2020 Target Resolution Date: 12/17/2020 Goal Status: Active Ulcer/skin breakdown will have a volume reduction of 30% by week 4 Date Initiated: 08/16/2020 Target Resolution Date: 12/19/2020 Goal Status: Active Interventions: Provide education on ulcer and skin care Notes: Electronic Signature(s) Signed: 12/03/2020 6:04:09 PM By: Baruch Gouty RN, BSN Entered By: Baruch Gouty on 12/02/2020 14:44:35 -------------------------------------------------------------------------------- Pain Assessment Details Patient Name: Date of Service: [name] Shire ND, [name] 12/02/2020 12:30 PM Medical Record Number: [id number] Patient Account Number: [id number] Date of Birth/Sex: Treating RN: 07/31/1971 (50 y.o. [name] Primary Care Klare Criss: [name] Other Clinician: Referring Pape Parson: Treating Dillon Mcreynolds/Extender: Aurora Mask in Treatment: 15 Active Problems Location of Pain Severity and Description of Pain Patient Has Paino No Patient Has Paino No Site Locations Pain Management and Medication Current Pain Management: Electronic Signature(s) Signed: 12/02/2020 5:04:19 PM By: Levan Hurst RN, BSN Entered By: Levan Hurst on 12/02/2020 12:49:09 -------------------------------------------------------------------------------- Patient/Caregiver Education Details Patient Name: Date of Service: [name] [name] 1/10/2022andnbsp12:30 PM Medical Record Number: [id number] Patient Account Number: [id number] Date of Birth/Gender: Treating RN: 1971/09/04 (50 y.o. [name] Primary Care Physician: [name] Other Clinician: Referring Physician: Treating Physician/Extender: Aurora Mask in Treatment: 15 Education Assessment Education Provided To: Patient Education Topics Provided Wound/Skin  Impairment: Methods: Explain/Verbal  Responses: Reinforcements needed, State content correctly Electronic Signature(s) Signed: 12/03/2020 6:04:09 PM By: Baruch Gouty RN, BSN Entered By: Baruch Gouty on 12/02/2020 14:46:11 -------------------------------------------------------------------------------- Wound Assessment Details Patient Name: Date of Service: [name], [name] 12/02/2020 12:30 PM Medical Record Number: [id number] Patient Account Number: [id number] Date of Birth/Sex: Treating RN: [birth year] (50 y.o. [name] Primary Care Devery Odwyer: [name] Other Clinician: Referring Desaray Marschner: Treating Micky Sheller/Extender: Aurora Mask in Treatment: 15 Wound Status Wound Number: 2R Primary Diabetic Wound/Ulcer of the Lower Extremity Etiology: Wound Location: Left Metatarsal head first Wound Status: Open Wounding Event: Blister Comorbid Anemia, Hypertension, Peripheral Venous Disease, Type II Date Acquired: 06/23/2020 History: Diabetes Weeks Of Treatment: 15 Clustered Wound: No Photos Photo Uploaded By: Mikeal Hawthorne on 12/03/2020 13:40:43 Wound Measurements Length: (cm) 0.8 Width: (cm) 0.4 Depth: (cm) 0.2 Area: (cm) 0.251 Volume: (cm) 0.05 % Reduction in Area: -709.7% % Reduction in Volume: -1566.7% Epithelialization: Small (1-33%) Tunneling: No Undermining: Yes Starting Position (o'clock): 6 Ending Position (o'clock): 11 Maximum Distance: (cm) 0.3 Wound Description Classification: Grade 2 Wound Margin: Thickened Exudate Amount: Medium Exudate Type: Serosanguineous Exudate Color: red, brown Foul Odor After Cleansing: No Slough/Fibrino Yes Wound Bed Granulation Amount: Large (67-100%) Exposed Structure Granulation Quality: Pink Fascia Exposed: No Necrotic Amount: None Present (0%) Fat Layer (Subcutaneous Tissue) Exposed: Yes Tendon Exposed: No Muscle Exposed: No Joint Exposed: No Bone Exposed: No Treatment  Notes Wound #2R (Metatarsal head first) Wound Laterality: Left Cleanser Wound Cleanser Discharge Instruction: Cleanse the wound with wound cleanser prior to applying a clean dressing using gauze sponges, not tissue or cotton balls. Peri-Wound Care Sween Lotion (Moisturizing lotion) Discharge Instruction: Apply moisturizing lotion as directed Topical Primary Dressing PolyMem Silver Non-Adhesive Dressing, 4.25x4.25 in Discharge Instruction: Apply to wound bed as instructed Secondary Dressing Woven Gauze Sponges 2x2 in Discharge Instruction: Apply over primary dressing as directed. Optifoam Non-Adhesive Dressing, 4x4 in Discharge Instruction: Apply over primary dressing cut to make foam donut to help offload Secured With Conforming Stretch Gauze Bandage, Sterile 2x75 (in/in) Discharge Instruction: Secure with stretch gauze as directed. Compression Wrap Compression Stockings Add-Ons Electronic Signature(s) Signed: 12/02/2020 5:04:19 PM By: Levan Hurst RN, BSN Entered By: Levan Hurst on 12/02/2020 12:53:56 -------------------------------------------------------------------------------- Vitals Details Patient Name: Date of Service: [name], [name] 12/02/2020 12:30 PM Medical Record Number: [id number] Patient Account Number: [id number] Date of Birth/Sex: Treating RN: [birth year] (50 y.o. [name] Primary Care Guhan Bruington: [name] Other Clinician: Referring Alphonzo Devera: Treating Devlynn Knoff/Extender: Aurora Mask in Treatment: 15 Vital Signs Time Taken: 12:48 Temperature (F): 98.8 Height (in): 77 Pulse (bpm): 103 Weight (lbs): 232 Respiratory Rate (breaths/min): 16 Body Mass Index (BMI): 27.5 Blood Pressure (mmHg): 130/82 Reference Range: 80 - 120 mg / dl Electronic Signature(s) Signed: 12/02/2020 5:04:19 PM By: Levan Hurst RN, BSN Entered By: Levan Hurst on 12/02/2020 12:49:04

## 2020-12-16 ENCOUNTER — Other Ambulatory Visit: Payer: Self-pay

## 2020-12-16 ENCOUNTER — Encounter (HOSPITAL_BASED_OUTPATIENT_CLINIC_OR_DEPARTMENT_OTHER): Payer: Medicare Other | Admitting: Internal Medicine

## 2020-12-16 DIAGNOSIS — E11621 Type 2 diabetes mellitus with foot ulcer: Secondary | ICD-10-CM | POA: Diagnosis not present

## 2020-12-16 NOTE — Progress Notes (Signed)
BOOKERT, GUZZI ([id number]) Visit Report for 12/16/2020 HPI Details Patient Name: Date of Service: [name] [name] 12/16/2020 12:30 PM Medical Record Number: [id number] Patient Account Number: [id number] Date of Birth/Sex: Treating RN: 07-05-1971 (50 y.o. [name] Primary Care Provider: [name] Other Clinician: Referring Provider: Treating Provider/Extender: Aurora Mask in Treatment: 12 History of Present Illness HPI Description: 04/21/18 ADMISSION This is a 50 year old man who is a type II diabetic. In spite of fact his hemoglobin A1c is actually quite good 5.73 months ago he is at a really difficult time over the last 6-7 months. He developed a rapidly progressive infection in the right foot in November 2018 associated with osteomyelitis and necrotizing fasciitis. 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 The stump required and a revision on 11/24/17. The stump revision was advertised as being secondary to falls. I'm not sure the progressive history here however this area is actually closed over. The patient tells me over the same timeframe he has had wounds on the plantar aspect of his left foot. In the ED saw Dr. Sharol Given in April of this year. 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 It is really not clear that this patient is been dressing this with anything. He came in with the clinic without any specific dressing on the wound areas using his own tennis shoe. He tells me that he only ambulates of course to do a pivot transfer. He does not have his prosthesis for the right leg as of yet and he blames Medicaid for this. He does however use the foot to push himself along in his wheelchair at home. The patient has not had formal arterial studies. ABI in our clinic on the left was 1.13. Patient's past medical history includes type 2 diabetes, fracture of the right fibula. I see that he was treated for  abscesses on his right buttock and chest in 2016. I did not look at the microbiology of this. 04/28/18; patient comes back in the clinic today with the wound pretty much the same as when he came in here last week. Small opening lots of undermining relatively. He tells [place] that nothing is really been on this for 3 days in spite of the fact that we gave him enough to dress this easily especially such a small wound. He says he lives with his mother, she is not capable of assisting with this he is changing the dressings himself. 05/05/18; much better-looking wound today. Smaller. There is some undermining medially however that's only perhaps 2 mm. No surrounding erythema. We've been using silver collagen 05/12/18; small wound on the first metatarsal head. No undermining no surrounding erythema. We've been using silver collagen he has home health coming out to change 05/20/18 the wound on the first metatarsal head looks better. Covered in surface debris/callus nonviable tissue. Required debridement but post debridement this looks quite good. We've been using silver collagen. He has home health 05/27/18; first metatarsal head wound continues to improve. Just about completely closed. Still a lot of surface debridement callus. We've been using silver collagen 06/06/18; the first metatarsal wound is completely healed over. There is still a lot of callus. I gently removed some of this just to make sure that there was no open area and there is not. The patient mentions to me that his left leg has felt like "lead" for about a week READMISSION 08/16/2020 This is a 50 year old man  with type 2 diabetes. He returns to clinic today with a 1 month history of a blister and callus over the first metatarsal head. This is in exactly the same area as when he was here in 2019. 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 He has standard running shoes on the left foot to match the area in  his prosthesis. He has only been covering this area with a Band-Aid has not really been specifically dressing this. ABI in our clinic on the left was 1.16. This is essentially stable from the value in 2019 10/1 the patient has a linear wound over the left first metatarsal head. Again not a lot of callus thick skin around this that I removed with a #3 curette. This is not go deep to bone or muscle it does not look to be infected. 10/8 first metatarsal head. The wound looks like it is closing however this is going to be a difficult area to offload in the future. He has a size 15 shoe and he says his shoes are years old. The inserts in these current shoes are totally useless and I have advised him to replace these. He may need a new pair of shoes and I have he got original prosthesis at [facility] [place] I have asked him to go back there. 11/9; the patient has not been here in more than a month. Not sure he was healed when he was here the last time. I told him he would have to get new shoes and certainly offload the area over the left first metatarsal head. He has done nothing of the kind. He arrives back in clinic with the same old new balance sneakers. A very large separating callus over the first metatarsal head on the left 11/16; he has purchased new shoes and has at least 2 insoles like I asked. The wound is measuring much smaller. He is using silver alginate he changes the dressing himself 11/30; wound is measurably smaller in length of about a half a centimeter. This is generally very little depth. We have been using silver alginate. He changes the dressing himself every second day. 12/14; wound is about the same size. Significant undermining medially relative to the size of the wound. We have been using silver alginate. There is not a way to offload this area as he walks with a prosthesis on the right leg 12/28; wound is about the same size. Again he has undermining medially. I am using  polymen on this 12/02/2020; the wound looks smaller. Still a senescent edge. We have been using polymen 1/24; the wound is come down a few millimeters. Still a senescent edge. I have been using polymen Electronic Signature(s) Signed: 12/16/2020 5:37:42 PM By: Linton Ham MD Entered By: Linton Ham on 12/16/2020 13:07:37 -------------------------------------------------------------------------------- Physical Exam Details Patient Name: Date of Service: [name], [name] 12/16/2020 12:30 PM Medical Record Number: [id number] Patient Account Number: [id number] Date of Birth/Sex: Treating RN: 10-18-1971 (50 y.o. [name] Primary Care Provider: [name] Other Clinician: Referring Provider: Treating Provider/Extender: Aurora Mask in Treatment: 17 Constitutional Sitting or standing Blood Pressure is within target range for patient.. Pulse regular and within target range for patient.Marland Kitchen Respirations regular, non-labored and within target range.. Temperature is normal and within the target range for the patient.Marland Kitchen Appears in no distress. Cardiovascular Pedal pulses are palpable. Notes Wound exam; left plantar first metatarsal head. The  wound is measuring smaller. There is still probably 2 mm of depth 1 cm undermining. No evidence of infection the wound surface looks quite good Electronic Signature(s) Signed: 12/16/2020 5:37:42 PM By: Linton Ham MD Entered By: Linton Ham on 12/16/2020 13:08:21 -------------------------------------------------------------------------------- Physician Orders Details Patient Name: Date of Service: [name], [name] 12/16/2020 12:30 PM Medical Record Number: [id number] Patient Account Number: [id number] Date of Birth/Sex: Treating RN: [birth year] (50 y.o. [name] Primary Care Provider: [name] Other Clinician: Referring Provider: Treating Provider/Extender: Aurora Mask in Treatment: 17 Verbal / Phone Orders: No Diagnosis Coding ICD-10 Coding Code Description E11.621 Type 2 diabetes mellitus with foot ulcer L97.522 Non-pressure chronic ulcer of other part of left foot with fat layer exposed E11.42 Type 2 diabetes mellitus with diabetic polyneuropathy Z89.511 Acquired absence of right leg below knee Follow-up Appointments Return Appointment in 2 weeks. Bathing/ Shower/ Hygiene May shower and wash wound with soap and water. - WITH DRESSING CHANGES. Edema Control - Lymphedema / SCD / Other Elevate legs to the level of the heart or above for 30 minutes daily and/or when sitting, a frequency of: Avoid standing for long periods of time. Wound Treatment Wound #2R - Metatarsal head first Wound Laterality: Left Cleanser: Wound Cleanser Every Other Day/30 Days Discharge Instructions: Cleanse the wound with wound cleanser prior to applying a clean dressing using gauze sponges, not tissue or cotton balls. Peri-Wound Care: Sween Lotion (Moisturizing lotion) Every Other Day/30 Days Discharge Instructions: Apply moisturizing lotion as directed Prim Dressing: PolyMem Silver Non-Adhesive Dressing, 4.25x4.25 in Every Other Day/30 Days ary Discharge Instructions: Apply to wound bed as instructed Secondary Dressing: Woven Gauze Sponges 2x2 in Every Other Day/30 Days Discharge Instructions: Apply over primary dressing as directed. Secondary Dressing: Optifoam Non-Adhesive Dressing, 4x4 in Every Other Day/30 Days Discharge Instructions: Apply over primary dressing cut to make foam donut to help offload Secured With: Conforming Stretch Gauze Bandage, Sterile 2x75 (in/in) (DME) (Generic) Every Other Day/30 Days Discharge Instructions: Secure with stretch gauze as directed. Electronic Signature(s) Signed: 12/16/2020 5:37:42 PM By: Linton Ham MD Signed: 12/16/2020 6:10:15 PM By: Baruch Gouty RN, BSN Entered By: Baruch Gouty on 12/16/2020  13:05:08 -------------------------------------------------------------------------------- Problem List Details Patient Name: Date of Service: [name], [name] 12/16/2020 12:30 PM Medical Record Number: [id number] Patient Account Number: [id number] Date of Birth/Sex: Treating RN: August 28, 1971 (50 y.o. [name] Primary Care Provider: [name] Other Clinician: Referring Provider: Treating Provider/Extender: Aurora Mask in Treatment: 17 Active Problems ICD-10 Encounter Code Description Active Date MDM Diagnosis E11.621 Type 2 diabetes mellitus with foot ulcer 08/16/2020 No Yes L97.522 Non-pressure chronic ulcer of other part of left foot with fat layer exposed 08/16/2020 No Yes E11.42 Type 2 diabetes mellitus with diabetic polyneuropathy 08/16/2020 No Yes Z89.511 Acquired absence of right leg below knee 08/16/2020 No Yes Inactive Problems Resolved Problems Electronic Signature(s) Signed: 12/16/2020 5:37:42 PM By: Linton Ham MD Entered By: Linton Ham on 12/16/2020 13:06:52 -------------------------------------------------------------------------------- Progress Note Details Patient Name: Date of Service: [name], [name] 12/16/2020 12:30 PM Medical Record Number: [id number] Patient Account Number: [id number] Date of Birth/Sex: Treating RN: 1971/07/24 (50 y.o. [name] Primary Care Provider: [name] Other Clinician: Referring Provider: Treating Provider/Extender: Aurora Mask in Treatment: 17 Subjective History of Present Illness (HPI) 04/21/18 ADMISSION This is a 50 year old man who is a type II diabetic. In spite of fact his hemoglobin A1c is actually quite good 5.73 months  ago he is at a really difficult time over the last 6-7 months. He developed a rapidly progressive infection in the right foot in November 2018 associated with osteomyelitis and necrotizing fasciitis. He had a  right BKA on November 21 18. The stump required and a revision on 11/24/17. The stump revision was advertised as being secondary to falls. I'm not sure the progressive history here however this area is actually closed over. The patient tells me over the same timeframe he has had wounds on the plantar aspect of his left foot. In the ED saw Dr. Sharol Given in April of this year. 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 It is really not clear that this patient is been dressing this with anything. He came in with the clinic without any specific dressing on the wound areas using his own tennis shoe. He tells me that he only ambulates of course to do a pivot transfer. He does not have his prosthesis for the right leg as of yet and he blames Medicaid for this. He does however use the foot to push himself along in his wheelchair at home. The patient has not had formal arterial studies. ABI in our clinic on the left was 1.13. Patient's past medical history includes type 2 diabetes, fracture of the right fibula. I see that he was treated for abscesses on his right buttock and chest in 2016. I did not look at the microbiology of this. 04/28/18; patient comes back in the clinic today with the wound pretty much the same as when he came in here last week. Small opening lots of undermining relatively. He tells [place] that nothing is really been on this for 3 days in spite of the fact that we gave him enough to dress this easily especially such a small wound. He says he lives with his mother, she is not capable of assisting with this he is changing the dressings himself. 05/05/18; much better-looking wound today. Smaller. There is some undermining medially however that's only perhaps 2 mm. No surrounding erythema. We've been using silver collagen 05/12/18; small wound on the first metatarsal head. No undermining no surrounding erythema. We've been using silver collagen he has home  health coming out to change 05/20/18 the wound on the first metatarsal head looks better. Covered in surface debris/callus nonviable tissue. Required debridement but post debridement this looks quite good. We've been using silver collagen. He has home health 05/27/18; first metatarsal head wound continues to improve. Just about completely closed. Still a lot of surface debridement callus. We've been using silver collagen 06/06/18; the first metatarsal wound is completely healed over. There is still a lot of callus. I gently removed some of this just to make sure that there was no open area and there is not. The patient mentions to me that his left leg has felt like "lead" for about a week READMISSION 08/16/2020 This is a 50 year old man with type 2 diabetes. He returns to clinic today with a 1 month history of a blister and callus over the first metatarsal head. This is in exactly the same area as when he was here in 2019. 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 He has standard running shoes on the left foot to match the area in his prosthesis. He has only been covering this area with a Band-Aid has not really been specifically dressing this. ABI in our  clinic on the left was 1.16. This is essentially stable from the value in 2019 10/1 the patient has a linear wound over the left first metatarsal head. Again not a lot of callus thick skin around this that I removed with a #3 curette. This is not go deep to bone or muscle it does not look to be infected. 10/8 first metatarsal head. The wound looks like it is closing however this is going to be a difficult area to offload in the future. He has a size 15 shoe and he says his shoes are years old. The inserts in these current shoes are totally useless and I have advised him to replace these. He may need a new pair of shoes and I have he got original prosthesis at [employer] [place] I have asked him to go  back there. 11/9; the patient has not been here in more than a month. Not sure he was healed when he was here the last time. I told him he would have to get new shoes and certainly offload the area over the left first metatarsal head. He has done nothing of the kind. He arrives back in clinic with the same old new balance sneakers. A very large separating callus over the first metatarsal head on the left 11/16; he has purchased new shoes and has at least 2 insoles like I asked. The wound is measuring much smaller. He is using silver alginate he changes the dressing himself 11/30; wound is measurably smaller in length of about a half a centimeter. This is generally very little depth. We have been using silver alginate. He changes the dressing himself every second day. 12/14; wound is about the same size. Significant undermining medially relative to the size of the wound. We have been using silver alginate. There is not a way to offload this area as he walks with a prosthesis on the right leg 12/28; wound is about the same size. Again he has undermining medially. I am using polymen on this 12/02/2020; the wound looks smaller. Still a senescent edge. We have been using polymen 1/24; the wound is come down a few millimeters. Still a senescent edge. I have been using polymen Objective Constitutional Sitting or standing Blood Pressure is within target range for patient.. Pulse regular and within target range for patient.Marland Kitchen Respirations regular, non-labored and within target range.. Temperature is normal and within the target range for the patient.Marland Kitchen Appears in no distress. Vitals Time Taken: 12:47 PM, Height: 77 in, Source: Stated, Weight: 232 lbs, Source: Stated, BMI: 27.5, Temperature: 98.3 F, Pulse: 87 bpm, Respiratory Rate: 18 breaths/min, Blood Pressure: 127/83 mmHg, Capillary Blood Glucose: 103 mg/dl. General Notes: glucose per pt report yesterday Cardiovascular Pedal pulses are  palpable. General Notes: Wound exam; left plantar first metatarsal head. The wound is measuring smaller. There is still probably 2 mm of depth 1 cm undermining. No evidence of infection the wound surface looks quite good Integumentary (Hair, Skin) Wound #2R status is Open. Original cause of wound was Blister. The wound is located on the Left Metatarsal head first. The wound measures 0.5cm length x 0.3cm width x 0.2cm depth; 0.118cm^2 area and 0.024cm^3 volume. There is Fat Layer (Subcutaneous Tissue) exposed. There is no tunneling or undermining noted. There is a small amount of serosanguineous drainage noted. The wound margin is thickened. There is large (67-100%) pink granulation within the wound bed. There is no necrotic tissue within the wound bed. Assessment Active Problems ICD-10 Type 2 diabetes  mellitus with foot ulcer Non-pressure chronic ulcer of other part of left foot with fat layer exposed Type 2 diabetes mellitus with diabetic polyneuropathy Acquired absence of right leg below knee Plan Follow-up Appointments: Return Appointment in 2 weeks. Bathing/ Shower/ Hygiene: May shower and wash wound with soap and water. - WITH DRESSING CHANGES. Edema Control - Lymphedema / SCD / Other: Elevate legs to the level of the heart or above for 30 minutes daily and/or when sitting, a frequency of: Avoid standing for long periods of time. WOUND #2R: - Metatarsal head first Wound Laterality: Left Cleanser: Wound Cleanser Every Other Day/30 Days Discharge Instructions: Cleanse the wound with wound cleanser prior to applying a clean dressing using gauze sponges, not tissue or cotton balls. Peri-Wound Care: Sween Lotion (Moisturizing lotion) Every Other Day/30 Days Discharge Instructions: Apply moisturizing lotion as directed Prim Dressing: PolyMem Silver Non-Adhesive Dressing, 4.25x4.25 in Every Other Day/30 Days ary Discharge Instructions: Apply to wound bed as instructed Secondary Dressing:  Woven Gauze Sponges 2x2 in Every Other Day/30 Days Discharge Instructions: Apply over primary dressing as directed. Secondary Dressing: Optifoam Non-Adhesive Dressing, 4x4 in Every Other Day/30 Days Discharge Instructions: Apply over primary dressing cut to make foam donut to help offload Secured With: Conforming Stretch Gauze Bandage, Sterile 2x75 (in/in) (DME) (Generic) Every Other Day/30 Days Discharge Instructions: Secure with stretch gauze as directed. 1. This wound is gradually getting smaller. I have continued with the polymen 2 as long as dimensions improve I have been trying to avoid debridement. Electronic Signature(s) Signed: 12/16/2020 5:37:42 PM By: Linton Ham MD Entered By: Linton Ham on 12/16/2020 13:09:06 -------------------------------------------------------------------------------- SuperBill Details Patient Name: Date of Service: [name], [name] 12/16/2020 Medical Record Number: [id number] Patient Account Number: [id number] Date of Birth/Sex: Treating RN: 1971-06-03 (50 y.o. [name], Vaughan Basta Primary Care Provider: [name] Other Clinician: Referring Provider: Treating Provider/Extender: Aurora Mask in Treatment: 17 Diagnosis Coding ICD-10 Codes Code Description E11.621 Type 2 diabetes mellitus with foot ulcer L97.522 Non-pressure chronic ulcer of other part of left foot with fat layer exposed E11.42 Type 2 diabetes mellitus with diabetic polyneuropathy Z89.511 Acquired absence of right leg below knee Facility Procedures CPT4 Code: [id number] Description: 99213 - WOUND CARE VISIT-LEV 3 EST PT Modifier: Quantity: 1 Physician Procedures : CPT4 Code Description Modifier [id number] - WC PHYS LEVEL 3 - EST PT ICD-10 Diagnosis Description E11.621 Type 2 diabetes mellitus with foot ulcer L97.522 Non-pressure chronic ulcer of other part of left foot with fat layer exposed Quantity: 1 Electronic Signature(s) Signed:  12/16/2020 5:37:42 PM By: Linton Ham MD Entered By: Linton Ham on 12/16/2020 13:09:29

## 2020-12-18 ENCOUNTER — Other Ambulatory Visit: Payer: Self-pay | Admitting: Nurse Practitioner

## 2020-12-18 ENCOUNTER — Ambulatory Visit
Admission: RE | Admit: 2020-12-18 | Discharge: 2020-12-18 | Disposition: A | Discharge status: Home / Self Care | Payer: Medicare Other | Source: Ambulatory Visit | Attending: Nurse Practitioner | Admitting: Nurse Practitioner

## 2020-12-18 DIAGNOSIS — R0781 Pleurodynia: Secondary | ICD-10-CM

## 2020-12-19 NOTE — Progress Notes (Signed)
JABER, DUNLOW ([id number]) Visit Report for 12/16/2020 Arrival Information Details Patient Name: Date of Service: [name] [name] 12/16/2020 12:30 PM Medical Record Number: [id number] Patient Account Number: [id number] Date of Birth/Sex: Treating RN: 1971-07-19 (50 y.o. [name] [name], [name] [name] Primary Care [name] [name]: [name] [name] Other Clinician: Referring [name] [name]: Treating [name] [name]/Extender: [name] [name] in Treatment: 18 Visit Information History Since Last Visit Added or deleted any medications: No Patient Arrived: Ambulatory Any new allergies or adverse reactions: No Arrival Time: 12:42 Had a fall or experienced change in Yes Accompanied By: self activities of daily living that may affect Transfer Assistance: None risk of falls: Patient Identification Verified: Yes Signs or symptoms of abuse/neglect since last visito No Secondary Verification Process Completed: Yes Hospitalized since last visit: No Patient Requires Transmission-Based Precautions: No Implantable device outside of the clinic excluding No Patient Has Alerts: No cellular tissue based products placed in the center since last visit: Has Dressing in Place as Prescribed: Yes Pain Present Now: Yes Electronic Signature(s) Signed: 12/16/2020 6:10:15 PM By: Baruch Gouty RN, BSN Entered By: Baruch Gouty on 12/16/2020 12:46:56 -------------------------------------------------------------------------------- Encounter Discharge Information Details Patient Name: Date of Service: [name] [name] ND, [name] 12/16/2020 12:30 PM Medical Record Number: [id number] Patient Account Number: [id number] Date of Birth/Sex: Treating RN: [birth year] (50 y.o. [name] [name] Primary Care [name] [name]: [name] [name] Other Clinician: Referring [name] [name]: Treating [name] [name]/Extender: [name] [name] in Treatment: 17 Encounter Discharge Information Items Discharge Condition:  Stable Ambulatory Status: Ambulatory Discharge Destination: Home Transportation: Private Auto Accompanied By: self Schedule Follow-up Appointment: Yes Clinical Summary of Care: Electronic Signature(s) Signed: 12/16/2020 6:39:14 PM By: [name] [name] Entered By: [name] [name] on 12/16/2020 15:22:18 -------------------------------------------------------------------------------- Lower Extremity Assessment Details Patient Name: Date of Service: [name] Arrow [name] 12/16/2020 12:30 PM Medical Record Number: [id number] Patient Account Number: [id number] Date of Birth/Sex: Treating RN: 1971-02-03 (50 y.o. [name] [name] Primary Care Millianna Szymborski: [name] [name] Other Clinician: Referring [name] [name]: Treating [name] [name]/Extender: [name] [name] in Treatment: 17 Edema Assessment Assessed: [name] [name]: No] [name] [name]: No] Edema: [Left: N] [Right: o] Calf Left: Right: Point of Measurement: 50 cm From Medial Instep 40 cm Ankle Left: Right: Point of Measurement: 11 cm From Medial Instep 26 cm Vascular Assessment Pulses: Dorsalis Pedis Palpable: [Left:Yes] Electronic Signature(s) Signed: 12/16/2020 6:10:15 PM By: Baruch Gouty RN, BSN Entered By: Baruch Gouty on 12/16/2020 12:49:31 -------------------------------------------------------------------------------- Multi Wound Chart Details Patient Name: Date of Service: [name] [name] ND, [name] 12/16/2020 12:30 PM Medical Record Number: [id number] Patient Account Number: [id number] Date of Birth/Sex: Treating RN: 11-01-1971 (50 y.o. [name] [name] Primary Care Lennyn Bellanca: [name] [name] Other Clinician: Referring [name] [name]: Treating [name] [name]/Extender: [name] [name] in Treatment: 17 Vital Signs Height(in): 77 Capillary Blood Glucose(mg/dl): 103 Weight(lbs): 232 Pulse(bpm): 39 Body Mass Index(BMI): 28 Blood Pressure(mmHg): 127/83 Temperature(F): 98.3 Respiratory Rate(breaths/min):  18 Photos: [2R:No Photos Left Metatarsal head first] [N/A:N/A N/A] Wound Location: [2R:Blister] [N/A:N/A] Wounding Event: [2R:Diabetic Wound/Ulcer of the Lower] [N/A:N/A] Primary Etiology: [2R:Extremity Anemia, Hypertension, Peripheral] [N/A:N/A] Comorbid History: [2R:Venous Disease, Type II Diabetes 06/23/2020] [N/A:N/A] Date Acquired: [2R:17] [N/A:N/A] Weeks of Treatment: [2R:Open] [N/A:N/A] Wound Status: [2R:Yes] [N/A:N/A] Wound Recurrence: [2R:0.5x0.3x0.2] [N/A:N/A] Measurements L x W x D (cm) [2R:0.118] [N/A:N/A] A (cm) : rea [2R:0.024] [N/A:N/A] Volume (cm) : [2R:-280.60%] [N/A:N/A] % Reduction in A rea: [2R:-700.00%] [N/A:N/A] % Reduction in Volume: [2R:Grade 2] [N/A:N/A] Classification: [2R:Small] [N/A:N/A] Exudate A mount: [2R:Serosanguineous] [N/A:N/A] Exudate Type: [2R:red, brown] [N/A:N/A] Exudate Color: [2R:Thickened] [N/A:N/A] Wound Margin: [2R:Large (67-100%)] [  N/A:N/A] Granulation A mount: [2R:Pink] [N/A:N/A] Granulation Quality: [2R:None Present (0%)] [N/A:N/A] Necrotic A mount: [2R:Fat Layer (Subcutaneous Tissue): Yes N/A] Exposed Structures: [2R:Fascia: No Tendon: No Muscle: No Joint: No Bone: No Small (1-33%)] [N/A:N/A] Treatment Notes Electronic Signature(s) Signed: 12/16/2020 5:37:42 PM By: [name] Ham MD Signed: 12/19/2020 5:48:06 PM By: [name] Hurst RN, BSN Entered By: [name] [name] on 12/16/2020 13:07:03 -------------------------------------------------------------------------------- Multi-Disciplinary Care Plan Details Patient Name: Date of Service: [name] [name] ND, [name] 12/16/2020 12:30 PM Medical Record Number: [id number] Patient Account Number: [id number] Date of Birth/Sex: Treating RN: 02-28-1971 (50 y.o. [name] [name] Primary Care [name] [name]: [name] [name] Other Clinician: Referring [name] [name]: Treating [name] [name]/Extender: [name] [name] in Treatment: 17 Active Inactive Wound/Skin Impairment Nursing  Diagnoses: Impaired tissue integrity Goals: Patient/caregiver will verbalize understanding of skin care regimen Date Initiated: 11/19/2020 Target Resolution Date: 01/14/2021 Goal Status: Active Ulcer/skin breakdown will have a volume reduction of 30% by week 4 Date Initiated: 08/16/2020 Target Resolution Date: 12/19/2020 Goal Status: Active Interventions: Provide education on ulcer and skin care Notes: Electronic Signature(s) Signed: 12/16/2020 6:10:15 PM By: Baruch Gouty RN, BSN Entered By: Baruch Gouty on 12/16/2020 12:50:29 -------------------------------------------------------------------------------- Pain Assessment Details Patient Name: Date of Service: [name] [name], [name] 12/16/2020 12:30 PM Medical Record Number: [id number] Patient Account Number: [id number] Date of Birth/Sex: Treating RN: 06-Mar-1971 (50 y.o. [name] [name] Primary Care Tyreon Frigon: [name] [name] Other Clinician: Referring Shaquella Stamant: Treating Zeniah Briney/Extender: [name] [name] in Treatment: 17 Active Problems Location of Pain Severity and Description of Pain Patient Has Paino Yes Site Locations Pain Location: Generalized Pain With Dressing Change: No Duration of the Pain. Constant / Intermittento Intermittent Rate the pain. Current Pain Level: 7 Character of Pain Describe the Pain: Aching, Other: sore Pain Management and Medication Current Pain Management: Medication: Yes Is the Current Pain Management Adequate: Adequate Rest: Yes Notes reports rib pain from falling on the ice Electronic Signature(s) Signed: 12/16/2020 6:10:15 PM By: Baruch Gouty RN, BSN Entered By: Baruch Gouty on 12/16/2020 12:48:56 -------------------------------------------------------------------------------- Patient/Caregiver Education Details Patient Name: Date of Service: [name] [name] 1/24/2022andnbsp12:30 PM Medical Record Number: [id number] Patient Account Number:  [id number] Date of Birth/Gender: Treating RN: [birth year] (50 y.o. [name] [name] Primary Care Physician: [name] [name] Other Clinician: Referring Physician: Treating Physician/Extender: [name] [name] in Treatment: 17 Education Assessment Education Provided To: Patient Education Topics Provided Offloading: Methods: Explain/Verbal Responses: Reinforcements needed, State content correctly Wound/Skin Impairment: Methods: Explain/Verbal Responses: Reinforcements needed, State content correctly Electronic Signature(s) Signed: 12/16/2020 6:10:15 PM By: Baruch Gouty RN, BSN Entered By: Baruch Gouty on 12/16/2020 12:51:10 -------------------------------------------------------------------------------- Wound Assessment Details Patient Name: Date of Service: [name] [name] ND, [name] 12/16/2020 12:30 PM Medical Record Number: [id number] Patient Account Number: [id number] Date of Birth/Sex: Treating RN: Feb 11, 1971 (50 y.o. 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 Etiology: Wound Location: Left Metatarsal head first Wound Status: Open Wounding Event: Blister Comorbid Anemia, Hypertension, Peripheral Venous Disease, Type II Date Acquired: 06/23/2020 History: Diabetes Weeks Of Treatment: 17 Clustered Wound: No Photos Photo Uploaded By: [name] [name] on 12/18/2020 13:36:30 Wound Measurements Length: (cm) 0.5 Width: (cm) 0.3 Depth: (cm) 0.2 Area: (cm) 0.118 Volume: (cm) 0.024 % Reduction in Area: -280.6% % Reduction in Volume: -700% Epithelialization: Small (1-33%) Tunneling: No Undermining: No Wound Description Classification: Grade 2 Wound Margin: Thickened Exudate Amount:  Small Exudate Type: Serosanguineous Exudate Color:  red, brown Foul Odor After Cleansing: No Slough/Fibrino Yes Wound Bed Granulation Amount: Large (67-100%) Exposed Structure Granulation Quality: Pink Fascia Exposed: No Necrotic Amount: None Present (0%) Fat Layer (Subcutaneous Tissue) Exposed: Yes Tendon Exposed: No Muscle Exposed: No Joint Exposed: No Bone Exposed: No Treatment Notes Wound #2R (Metatarsal head first) Wound Laterality: Left Cleanser Wound Cleanser Discharge Instruction: Cleanse the wound with wound cleanser prior to applying a clean dressing using gauze sponges, not tissue or cotton balls. Peri-Wound Care Sween Lotion (Moisturizing lotion) Discharge Instruction: Apply moisturizing lotion as directed Topical Primary Dressing PolyMem Silver Non-Adhesive Dressing, 4.25x4.25 in Discharge Instruction: Apply to wound bed as instructed Secondary Dressing Woven Gauze Sponges 2x2 in Discharge Instruction: Apply over primary dressing as directed. Optifoam Non-Adhesive Dressing, 4x4 in Discharge Instruction: Apply over primary dressing cut to make foam donut to help offload Secured With Conforming Stretch Gauze Bandage, Sterile 2x75 (in/in) Discharge Instruction: Secure with stretch gauze as directed. Compression Wrap Compression Stockings Add-Ons Electronic Signature(s) Signed: 12/16/2020 6:10:15 PM By: Baruch Gouty RN, BSN Entered By: Baruch Gouty on 12/16/2020 12:49:52 -------------------------------------------------------------------------------- Vitals Details Patient Name: Date of Service: [name] [name] ND, [name] 12/16/2020 12:30 PM Medical Record Number: [id number] Patient Account Number: [id number] Date of Birth/Sex: Treating RN: 06/28/1971 (50 y.o. [name] [name] Primary Care Kavaughn Faucett: [name] [name] Other Clinician: Referring Donielle Radziewicz: Treating Ukiah Trawick/Extender: [name] [name] in Treatment: 17 Vital Signs Time Taken: 12:47 Temperature (F): 98.3 Height (in):  77 Pulse (bpm): 87 Source: Stated Respiratory Rate (breaths/min): 18 Weight (lbs): 232 Blood Pressure (mmHg): 127/83 Source: Stated Capillary Blood Glucose (mg/dl): 103 Body Mass Index (BMI): 27.5 Reference Range: 80 - 120 mg / dl Notes glucose per pt report yesterday Electronic Signature(s) Signed: 12/16/2020 6:10:15 PM By: Baruch Gouty RN, BSN Entered By: Baruch Gouty on 12/16/2020 12:48:00

## 2020-12-31 ENCOUNTER — Encounter (HOSPITAL_BASED_OUTPATIENT_CLINIC_OR_DEPARTMENT_OTHER): Payer: Medicare Other | Attending: Internal Medicine | Admitting: Internal Medicine

## 2020-12-31 ENCOUNTER — Other Ambulatory Visit: Payer: Self-pay

## 2020-12-31 DIAGNOSIS — E11621 Type 2 diabetes mellitus with foot ulcer: Secondary | ICD-10-CM | POA: Insufficient documentation

## 2020-12-31 DIAGNOSIS — Z89511 Acquired absence of right leg below knee: Secondary | ICD-10-CM | POA: Diagnosis not present

## 2020-12-31 DIAGNOSIS — E1142 Type 2 diabetes mellitus with diabetic polyneuropathy: Secondary | ICD-10-CM | POA: Insufficient documentation

## 2020-12-31 DIAGNOSIS — L97522 Non-pressure chronic ulcer of other part of left foot with fat layer exposed: Secondary | ICD-10-CM | POA: Diagnosis present

## 2020-12-31 NOTE — Progress Notes (Signed)
ALESSIO, BOGAN ([id number]) Visit Report for 12/31/2020 Debridement Details Patient Name: Date of Service: [name] [name] 12/31/2020 12:30 PM Medical Record Number: [id number] Patient Account Number: [id number] Date of Birth/Sex: Treating RN: 03-22-1971 (50 y.o. [name] [name], [name] [name] Primary Care Provider: Ferd [name] Other Clinician: Referring Provider: Treating Provider/Extender: [name] [name] in Treatment: 19 Debridement Performed for Assessment: Wound #2R Left Metatarsal head first Performed By: Physician [name] [name]., MD Debridement Type: Debridement Severity of Tissue Pre Debridement: Fat layer exposed Level of Consciousness (Pre-procedure): Awake and Alert Pre-procedure Verification/Time Out Yes - 13:05 Taken: Start Time: 13:05 Pain Control: Lidocaine T Area Debrided (L x W): otal 0.3 (cm) x 0.2 (cm) = 0.06 (cm) Tissue and other material debrided: Viable, Non-Viable, Callus, Subcutaneous, Skin: Dermis , Skin: Epidermis Level: Skin/Subcutaneous Tissue Debridement Description: Excisional Instrument: Curette Bleeding: Minimum Hemostasis Achieved: Pressure End Time: 13:06 Procedural Pain: 0 Post Procedural Pain: 0 Response to Treatment: Procedure was tolerated well Level of Consciousness (Post- Awake and Alert procedure): Post Debridement Measurements of Total Wound Length: (cm) 0.3 Width: (cm) 0.2 Depth: (cm) 0.2 Volume: (cm) 0.009 Character of Wound/Ulcer Post Debridement: Improved Severity of Tissue Post Debridement: Fat layer exposed Post Procedure Diagnosis Same as Pre-procedure Electronic Signature(s) Signed: 12/31/2020 4:37:05 PM By: [name] Ham MD Signed: 12/31/2020 5:05:22 PM By: [name] Hammock RN Entered By: [name] [name] on 12/31/2020 13:15:40 -------------------------------------------------------------------------------- HPI Details Patient Name: Date of Service: [name] [name] [name] [name], [name] 12/31/2020 12:30  PM Medical Record Number: [id number] Patient Account Number: [id number] Date of Birth/Sex: Treating RN: 1971/03/21 (50 y.o. [name] [name] Primary Care Provider: Ferd [name] Other Clinician: Referring Provider: Treating Provider/Extender: [name] [name] in Treatment: 27 History of Present Illness HPI Description: 04/21/18 ADMISSION This is a 50 year old man who is a type II diabetic. In spite of fact his hemoglobin A1c is actually quite good 5.73 months ago he is at a really difficult time over the last 6-7 months. He developed a rapidly progressive infection in the right foot in November 2018 associated with osteomyelitis and necrotizing fasciitis. 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 The stump required and a revision on 11/24/17. The stump revision was advertised as being secondary to falls. I'm not sure the progressive history here however this area is actually closed over. The patient tells me over the same timeframe he has had wounds on the plantar aspect of his left foot. In the ED saw Dr. Sharol [name] in April of this year. 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 It is really not clear that this patient is been dressing this with anything. He came in with the clinic without any specific dressing on the wound areas using his own tennis shoe. He tells me that he only ambulates of course to do a pivot transfer. He does not have his prosthesis for the right leg as of yet and he blames Medicaid for this. He does however use the foot to push himself along in his wheelchair at home. The patient has not had formal arterial studies. ABI in our clinic on the left was 1.13. Patient's past medical history includes type 2 diabetes, fracture of the right fibula. I see that he was treated for abscesses on his right buttock and chest in 2016. I did not look at the microbiology of this. 04/28/18; patient comes back in the clinic today  with the wound pretty much the same as when he  came in here last week. Small opening lots of undermining relatively. He tells [place] that nothing is really been on this for 3 days in spite of the fact that we gave him enough to dress this easily especially such a small wound. He says he lives with his mother, she is not capable of assisting with this he is changing the dressings himself. 05/05/18; much better-looking wound today. Smaller. There is some undermining medially however that's only perhaps 2 mm. No surrounding erythema. We've been using silver collagen 05/12/18; small wound on the first metatarsal head. No undermining no surrounding erythema. We've been using silver collagen he has home health coming out to change 05/20/18 the wound on the first metatarsal head looks better. Covered in surface debris/callus nonviable tissue. Required debridement but post debridement this looks quite good. We've been using silver collagen. He has home health 05/27/18; first metatarsal head wound continues to improve. Just about completely closed. Still a lot of surface debridement callus. We've been using silver collagen 06/06/18; the first metatarsal wound is completely healed over. There is still a lot of callus. I gently removed some of this just to make sure that there was no open area and there is not. The patient mentions to me that his left leg has felt like "lead" for about a week READMISSION 08/16/2020 This is a 50 year old man with type 2 diabetes. He returns to clinic today with a 1 month history of a blister and callus over the first metatarsal head. This is in exactly the same area as when he was here in 2019. 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 He has standard running shoes on the left foot to match the area in his prosthesis. He has only been covering this area with a Band-Aid has not really been specifically dressing this. ABI in our clinic on the  left was 1.16. This is essentially stable from the value in 2019 10/1 the patient has a linear wound over the left first metatarsal head. Again not a lot of callus thick skin around this that I removed with a #3 curette. This is not go deep to bone or muscle it does not look to be infected. 10/8 first metatarsal head. The wound looks like it is closing however this is going to be a difficult area to offload in the future. He has a size 15 shoe and he says his shoes are years old. The inserts in these current shoes are totally useless and I have advised him to replace these. He may need a new pair of shoes and I have he got original prosthesis at [facility] [place] I have asked him to go back there. 11/9; the patient has not been here in more than a month. Not sure he was healed when he was here the last time. I told him he would have to get new shoes and certainly offload the area over the left first metatarsal head. He has done nothing of the kind. He arrives back in clinic with the same old new balance sneakers. A very large separating callus over the first metatarsal head on the left 11/16; he has purchased new shoes and has at least 2 insoles like I asked. The wound is measuring much smaller. He is using silver alginate he changes the dressing himself 11/30; wound is measurably smaller in length of about a half a centimeter. This is generally very little depth. We have  been using silver alginate. He changes the dressing himself every second day. 12/14; wound is about the same size. Significant undermining medially relative to the size of the wound. We have been using silver alginate. There is not a way to offload this area as he walks with a prosthesis on the right leg 12/28; wound is about the same size. Again he has undermining medially. I am using polymen on this 12/02/2020; the wound looks smaller. Still a senescent edge. We have been using polymen 1/24; the wound is come down a few  millimeters. Still a senescent edge. I have been using polymen 2/8; the wound is come down slightly. Still with slight undermining from 12-6 o'clock I have been using polymen partially to get offloading on this area which is opposed by his prosthesis on the other leg. Electronic Signature(s) Signed: 12/31/2020 4:37:05 PM By: [name] Ham MD Entered By: [name] [name] on 12/31/2020 13:16:21 -------------------------------------------------------------------------------- Physical Exam Details Patient Name: Date of Service: [name] [name] [name] [name], [name] 12/31/2020 12:30 PM Medical Record Number: [id number] Patient Account Number: [id number] Date of Birth/Sex: Treating RN: 06/30/1971 (50 y.o. [name] [name] Primary Care Provider: Ferd [name] Other Clinician: Referring Provider: Treating Provider/Extender: [name] [name] in Treatment: 19 Constitutional Sitting or standing Blood Pressure is within target range for patient.. Pulse regular and within target range for patient.Marland Kitchen Respirations regular, non-labored and within target range.. Temperature is normal and within the target range for the patient.Marland Kitchen Appears in no distress. Cardiovascular Pulses are easily palpable on the left. Notes Wound exam; left plantar first metatarsal head. I used a #3 curette to gently remove skin and subcutaneous tissue from the over hanging area from 12-6 o'clock. The wound surface actually looks quite good. There is no evidence of infection Electronic Signature(s) Signed: 12/31/2020 4:37:05 PM By: [name] Ham MD Entered By: [name] [name] on 12/31/2020 13:17:30 -------------------------------------------------------------------------------- Physician Orders Details Patient Name: Date of Service: [name] [name] [name] [name], [name] 12/31/2020 12:30 PM Medical Record Number: [id number] Patient Account Number: [id number] Date of Birth/Sex: Treating RN: November 13, 1971 (50 y.o. [name] [name] Primary  Care Provider: Ferd [name] Other Clinician: Referring Provider: Treating Provider/Extender: [name] [name] in Treatment: 81 Verbal / Phone Orders: No Diagnosis Coding Follow-up Appointments Return Appointment in 2 weeks. Bathing/ Shower/ Hygiene May shower and wash wound with soap and water. - WITH DRESSING CHANGES. Edema Control - Lymphedema / SCD / Other Elevate legs to the level of the heart or above for 30 minutes daily and/or when sitting, a frequency of: Avoid standing for long periods of time. Off-Loading Other: - use felt for offloading Wound Treatment Wound #2R - Metatarsal head first Wound Laterality: Left Cleanser: Wound Cleanser (DME) (Generic) Every Other Day/15 Days Discharge Instructions: Cleanse the wound with wound cleanser prior to applying a clean dressing using gauze sponges, not tissue or cotton balls. Cleanser: Wound Cleanser Every Other Day/15 Days Discharge Instructions: Cleanse the wound with wound cleanser prior to applying a clean dressing using gauze sponges, not tissue or cotton balls. Peri-Wound Care: Sween Lotion (Moisturizing lotion) Every Other Day/15 Days Discharge Instructions: Apply moisturizing lotion as directed Prim Dressing: PolyMem Silver Non-Adhesive Dressing, 4.25x4.25 in (DME) (Generic) Every Other Day/15 Days ary Discharge Instructions: Apply to wound bed as instructed Secondary Dressing: Woven Gauze Sponges 2x2 in (DME) (Generic) Every Other Day/15 Days Discharge Instructions: Apply over primary dressing as directed. Secondary Dressing: Optifoam Non-Adhesive Dressing, 4x4 in (DME) (Generic) Every Other Day/15 Days Discharge Instructions: Apply over primary dressing  cut to make foam donut to help offload Secured With: Conforming Stretch Gauze Bandage, Sterile 2x75 (in/in) (DME) (Generic) Every Other Day/15 Days Discharge Instructions: Secure with stretch gauze as directed. Secured With: 55M Medipore H Soft Cloth  Surgical Tape, 2x2 (in/yd) (DME) (Generic) Every Other Day/15 Days Discharge Instructions: Secure dressing with tape as directed. Electronic Signature(s) Signed: 12/31/2020 4:37:05 PM By: [name] Ham MD Signed: 12/31/2020 5:05:22 PM By: [name] Hammock RN Entered By: [name] [name] on 12/31/2020 13:56:26 -------------------------------------------------------------------------------- Problem List Details Patient Name: Date of Service: [name] [name] [name] [name], [place] 12/31/2020 12:30 PM Medical Record Number: [id number] Patient Account Number: [id number] Date of Birth/Sex: Treating RN: [birth year] (50 y.o. [name] [name], [name] [name] Primary Care Provider: Ferd [name] Other Clinician: Referring Provider: Treating Provider/Extender: [name] [name] in Treatment: 19 Active Problems ICD-10 Encounter Code Description Active Date MDM Diagnosis E11.621 Type 2 diabetes mellitus with foot ulcer 08/16/2020 No Yes L97.522 Non-pressure chronic ulcer of other part of left foot with fat layer exposed 08/16/2020 No Yes E11.42 Type 2 diabetes mellitus with diabetic polyneuropathy 08/16/2020 No Yes Z89.511 Acquired absence of right leg below knee 08/16/2020 No Yes Inactive Problems Resolved Problems Electronic Signature(s) Signed: 12/31/2020 4:37:05 PM By: [name] Ham MD Entered By: [name] [name] on 12/31/2020 13:15:21 -------------------------------------------------------------------------------- Progress Note Details Patient Name: Date of Service: [name] [name] [name] [name], [name] 12/31/2020 12:30 PM Medical Record Number: [id number] Patient Account Number: [id number] Date of Birth/Sex: Treating RN: 01-03-1971 (50 y.o. [name] [name] Primary Care Provider: Ferd [name] Other Clinician: Referring Provider: Treating Provider/Extender: [name] [name] in Treatment: 19 Subjective History of Present Illness (HPI) 04/21/18 ADMISSION This is a 50 year old man  who is a type II diabetic. In spite of fact his hemoglobin A1c is actually quite good 5.73 months ago he is at a really difficult time over the last 6-7 months. He developed a rapidly progressive infection in the right foot in November 2018 associated with osteomyelitis and necrotizing fasciitis. 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 The stump required and a revision on 11/24/17. The stump revision was advertised as being secondary to falls. I'm not sure the progressive history here however this area is actually closed over. The patient tells me over the same timeframe he has had wounds on the plantar aspect of his left foot. In the ED saw Dr. Sharol [name] in April of this year. 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 It is really not clear that this patient is been dressing this with anything. He came in with the clinic without any specific dressing on the wound areas using his own tennis shoe. He tells me that he only ambulates of course to do a pivot transfer. He does not have his prosthesis for the right leg as of yet and he blames Medicaid for this. He does however use the foot to push himself along in his wheelchair at home. The patient has not had formal arterial studies. ABI in our clinic on the left was 1.13. Patient's past medical history includes type 2 diabetes, fracture of the right fibula. I see that he was treated for abscesses on his right buttock and chest in 2016. I did not look at the microbiology of this. 04/28/18; patient comes back in the clinic today with the wound pretty much the same as when he came in here last week. Small opening lots of undermining relatively. He tells [place] that nothing is really been on this  for 3 days in spite of the fact that we gave him enough to dress this easily especially such a small wound. He says he lives with his mother, she is not capable of assisting with this he is changing the dressings himself. 05/05/18;  much better-looking wound today. Smaller. There is some undermining medially however that's only perhaps 2 mm. No surrounding erythema. We've been using silver collagen 05/12/18; small wound on the first metatarsal head. No undermining no surrounding erythema. We've been using silver collagen he has home health coming out to change 05/20/18 the wound on the first metatarsal head looks better. Covered in surface debris/callus nonviable tissue. Required debridement but post debridement this looks quite good. We've been using silver collagen. He has home health 05/27/18; first metatarsal head wound continues to improve. Just about completely closed. Still a lot of surface debridement callus. We've been using silver collagen 06/06/18; the first metatarsal wound is completely healed over. There is still a lot of callus. I gently removed some of this just to make sure that there was no open area and there is not. The patient mentions to me that his left leg has felt like "lead" for about a week READMISSION 08/16/2020 This is a 50 year old man with type 2 diabetes. He returns to clinic today with a 1 month history of a blister and callus over the first metatarsal head. This is in exactly the same area as when he was here in 2019. 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 He has standard running shoes on the left foot to match the area in his prosthesis. He has only been covering this area with a Band-Aid has not really been specifically dressing this. ABI in our clinic on the left was 1.16. This is essentially stable from the value in 2019 10/1 the patient has a linear wound over the left first metatarsal head. Again not a lot of callus thick skin around this that I removed with a #3 curette. This is not go deep to bone or muscle it does not look to be infected. 10/8 first metatarsal head. The wound looks like it is closing however this is going to be a difficult  area to offload in the future. He has a size 15 shoe and he says his shoes are years old. The inserts in these current shoes are totally useless and I have advised him to replace these. He may need a new pair of shoes and I have he got original prosthesis at [facility] [place] I have asked him to go back there. 11/9; the patient has not been here in more than a month. Not sure he was healed when he was here the last time. I told him he would have to get new shoes and certainly offload the area over the left first metatarsal head. He has done nothing of the kind. He arrives back in clinic with the same old new balance sneakers. A very large separating callus over the first metatarsal head on the left 11/16; he has purchased new shoes and has at least 2 insoles like I asked. The wound is measuring much smaller. He is using silver alginate he changes the dressing himself 11/30; wound is measurably smaller in length of about a half a centimeter. This is generally very little depth. We have been using silver alginate. He changes the dressing himself every second day. 12/14; wound is about the same size. Significant  undermining medially relative to the size of the wound. We have been using silver alginate. There is not a way to offload this area as he walks with a prosthesis on the right leg 12/28; wound is about the same size. Again he has undermining medially. I am using polymen on this 12/02/2020; the wound looks smaller. Still a senescent edge. We have been using polymen 1/24; the wound is come down a few millimeters. Still a senescent edge. I have been using polymen 2/8; the wound is come down slightly. Still with slight undermining from 12-6 o'clock I have been using polymen partially to get offloading on this area which is opposed by his prosthesis on the other leg. Objective Constitutional Sitting or standing Blood Pressure is within target range for patient.. Pulse regular and within target  range for patient.Marland Kitchen Respirations regular, non-labored and within target range.. Temperature is normal and within the target range for the patient.Marland Kitchen Appears in no distress. Vitals Time Taken: 12:43 PM, Height: 77 in, Weight: 232 lbs, BMI: 27.5, Temperature: 97.8 F, Pulse: 93 bpm, Respiratory Rate: 18 breaths/min, Blood Pressure: 125/83 mmHg, Capillary Blood Glucose: 116 mg/dl. Cardiovascular Pulses are easily palpable on the left. General Notes: Wound exam; left plantar first metatarsal head. I used a #3 curette to gently remove skin and subcutaneous tissue from the over hanging area from 12-6 o'clock. The wound surface actually looks quite good. There is no evidence of infection Integumentary (Hair, Skin) Wound #2R status is Open. Original cause of wound was Blister. The wound is located on the Left Metatarsal head first. The wound measures 0.3cm length x 0.2cm width x 0.2cm depth; 0.047cm^2 area and 0.009cm^3 volume. There is Fat Layer (Subcutaneous Tissue) exposed. There is no tunneling noted, however, there is undermining starting at 6:00 and ending at 12:00 with a maximum distance of 0.3cm. There is a small amount of serosanguineous drainage noted. The wound margin is thickened. There is large (67-100%) pink granulation within the wound bed. There is no necrotic tissue within the wound bed. 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 There was a Excisional Skin/Subcutaneous Tissue Debridement with a total area of 0.06 sq cm performed by [name] [name]., MD. With the following  instrument(s): Curette to remove Viable and Non-Viable tissue/material. Material removed includes Callus, Subcutaneous Tissue, Skin: Dermis, and Skin: Epidermis after achieving pain control using Lidocaine. No specimens were taken. A time out was conducted at 13:05, prior to the start of the procedure. A Minimum amount of bleeding was controlled with Pressure. The procedure was tolerated well with a pain level of 0 throughout and a pain level of 0 following the procedure. Post Debridement Measurements: 0.3cm length x 0.2cm width x 0.2cm depth; 0.009cm^3 volume. Character of Wound/Ulcer Post Debridement is improved. Severity of Tissue Post Debridement is: Fat layer exposed. Post procedure Diagnosis Wound #2R: Same as Pre-Procedure Plan Follow-up Appointments: Return Appointment in 2 weeks. Bathing/ Shower/ Hygiene: May shower and wash wound with soap and water. - WITH DRESSING CHANGES. Edema Control - Lymphedema / SCD / Other: Elevate legs to the level of the heart or above for 30 minutes daily and/or  when sitting, a frequency of: Avoid standing for long periods of time. Off-Loading: Other: - use felt for offloading WOUND #2R: - Metatarsal head first Wound Laterality: Left Cleanser: Wound Cleanser (DME) (Generic) Every Other Day/15 Days Discharge Instructions: Cleanse the wound with wound cleanser prior to applying a clean dressing using gauze sponges, not tissue or cotton balls. Cleanser: Wound Cleanser (DME) (Generic) Every Other Day/15 Days Discharge Instructions: Cleanse the wound with wound cleanser prior to applying a clean dressing using gauze sponges, not tissue or cotton balls. Peri-Wound Care: Sween Lotion (Moisturizing lotion) Every Other Day/15 Days Discharge Instructions: Apply moisturizing lotion as directed Prim Dressing: PolyMem Silver Non-Adhesive Dressing, 4.25x4.25 in (DME) (Generic) Every Other Day/15 Days ary Discharge Instructions: Apply to wound bed as  instructed Secondary Dressing: Woven Gauze Sponges 2x2 in (DME) (Generic) Every Other Day/15 Days Discharge Instructions: Apply over primary dressing as directed. Secondary Dressing: Optifoam Non-Adhesive Dressing, 4x4 in (DME) (Generic) Every Other Day/15 Days Discharge Instructions: Apply over primary dressing cut to make foam donut to help offload Secured With: Conforming Stretch Gauze Bandage, Sterile 2x75 (in/in) (DME) (Generic) Every Other Day/15 Days Discharge Instructions: Secure with stretch gauze as directed. Secured With: 28M Medipore H Soft Cloth Surgical T ape, 2x2 (in/yd) (DME) (Generic) Every Other Day/15 Days Discharge Instructions: Secure dressing with tape as directed. 1. I am continue with polymen Ag we are continuing to make gradual progress. 2. I think the big feature here is inability to offload this area adequately [name] the prosthesis on the other side 3. I have asked him to get another insole for his left shoe and perhaps to cut out the area of the wound on the first met head. This might float the area a little more aggressively. Were also going to try to fit into some felt Electronic Signature(s) Signed: 12/31/2020 4:37:05 PM By: [name] Ham MD Entered By: [name] [name] on 12/31/2020 13:18:39 -------------------------------------------------------------------------------- SuperBill Details Patient Name: Date of Service: [name] [name] [name] [name], [name] 12/31/2020 Medical Record Number: [id number] Patient Account Number: [id number] Date of Birth/Sex: Treating RN: [birth year] (50 y.o. 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

## 2021-01-01 NOTE — Progress Notes (Signed)
[name] [name] ([id number]) Visit Report for 12/31/2020 Arrival Information Details Patient Name: Date of Service: [name] [name] 12/31/2020 12:30 PM Medical Record Number: [id number] Patient Account Number: [id number] Date of Birth/Sex: Treating RN: [birth year] (50 y.o. [name] [name] Primary Care [name] [name]: [name] [name] Other Clinician: Referring [name]: Treating [name] [name]/Extender: [name] [name] in Treatment: 54 Visit Information History Since Last Visit Added or deleted any medications: No Patient Arrived: Ambulatory Any new allergies or adverse reactions: No Arrival Time: 12:42 Had a fall or experienced change in Yes Accompanied By: self activities of daily living that may affect Transfer Assistance: None risk of falls: Patient Identification Verified: Yes Signs or symptoms of abuse/neglect since last visito No Secondary Verification Process Completed: Yes Hospitalized since last visit: No Patient Requires Transmission-Based Precautions: No Implantable device outside of the clinic excluding No Patient Has Alerts: No cellular tissue based products placed in the center since last visit: Has Dressing in Place as Prescribed: Yes Pain Present Now: No Notes patient states he had a fall when the snow/ice came. Patient states he as a few broken ribs. He does not know the exact day he fell. Electronic Signature(s) Signed: 01/01/2021 8:10:33 AM By: [name] [name] Entered By: [name] [name] on 12/31/2020 12:43:57 -------------------------------------------------------------------------------- Encounter Discharge Information Details Patient Name: Date of Service: [name] Shire ND, [name] 12/31/2020 12:30 PM Medical Record Number: [id number] Patient Account Number: [id number] Date of Birth/Sex: Treating RN: [birth year] (50 y.o. [name] [name] Primary Care [name] [name]: [name] [name] Other Clinician: Referring [name] [name]: Treating  [name] [name]/Extender: [name] [name] in Treatment: 19 Encounter Discharge Information Items Post Procedure Vitals Discharge Condition: Stable Temperature (F): 97.8 Ambulatory Status: Ambulatory Pulse (bpm): 93 Discharge Destination: Home Respiratory Rate (breaths/min): 18 Transportation: Private Auto Blood Pressure (mmHg): 125/83 Accompanied By: alone Schedule Follow-up Appointment: Yes Clinical Summary of Care: Patient Declined Electronic Signature(s) Signed: 12/31/2020 5:04:56 PM By: [name] [name] Entered By: [name] Hurst on 12/31/2020 13:20:13 -------------------------------------------------------------------------------- Lower Extremity Assessment Details Patient Name: Date of Service: [name] ND, [name] 12/31/2020 12:30 PM Medical Record Number: [id number] Patient Account Number: [id number] Date of Birth/Sex: Treating RN: [birth year] (50 y.o. [name] [name] Primary Care [name] [name]: [name] [name] Other Clinician: Referring [name] [name]: Treating Lanita Stammen/Extender: [name] [name] in Treatment: 19 Edema Assessment Assessed: [name]: No] [name] [name]: No] Edema: [Left: N] [Right: o] Calf Left: Right: Point of Measurement: 50 cm From Medial Instep 40 cm Ankle Left: Right: Point of Measurement: 11 cm From Medial Instep 26 cm Vascular Assessment Pulses: Dorsalis Pedis Palpable: [Left:Yes] Electronic Signature(s) Signed: 12/31/2020 5:04:56 PM By: [name] [name] Entered By: [name] Hurst on 12/31/2020 12:57:39 -------------------------------------------------------------------------------- Multi Wound Chart Details Patient Name: Date of Service: [name] Shire ND, [name] 12/31/2020 12:30 PM Medical Record Number: [id number] Patient Account Number: [id number] Date of Birth/Sex: Treating RN: 1970-11-26 (50 y.o. [name] [name] Primary Care Babygirl Trager: [name] [name] Other Clinician: Referring [name] [name]: Treating  [name] [name]/Extender: [name] [name] in Treatment: 19 Vital Signs Height(in): 77 Capillary Blood Glucose(mg/dl): 116 Weight(lbs): 232 Pulse(bpm): 70 Body Mass Index(BMI): 28 Blood Pressure(mmHg): 125/83 Temperature(F): 97.8 Respiratory Rate(breaths/min): 18 Photos: [2R:No Photos Left Metatarsal head first] [N/A:N/A N/A] Wound Location: [2R:Blister] [N/A:N/A] Wounding Event: [2R:Diabetic Wound/Ulcer of the Lower] [N/A:N/A] Primary Etiology: [2R:Extremity Anemia, Hypertension, Peripheral] [N/A:N/A] Comorbid History: [2R:Venous Disease, Type II Diabetes 06/23/2020] [N/A:N/A] Date Acquired: [2R:19] [N/A:N/A] Weeks of Treatment: [2R:Open] [N/A:N/A] Wound Status: [2R:Yes] [N/A:N/A] Wound Recurrence: [2R:0.3x0.2x0.2] [N/A:N/A] Measurements L x W x D (cm) [2R:0.047] [  N/A:N/A] A (cm) : rea [2R:0.009] [N/A:N/A] Volume (cm) : [2R:-51.60%] [N/A:N/A] % Reduction in A rea: [2R:-200.00%] [N/A:N/A] % Reduction in Volume: [2R:6] Starting Position 1 (o'clock): [2R:12] Ending Position 1 (o'clock): [2R:0.3] Maximum Distance 1 (cm): [2R:Yes] [N/A:N/A] Undermining: [2R:Grade 2] [N/A:N/A] Classification: [2R:Small] [N/A:N/A] Exudate A mount: [2R:Serosanguineous] [N/A:N/A] Exudate Type: [2R:red, brown] [N/A:N/A] Exudate Color: [2R:Thickened] [N/A:N/A] Wound Margin: [2R:Large (67-100%)] [N/A:N/A] Granulation A mount: [2R:Pink] [N/A:N/A] Granulation Quality: [2R:None Present (0%)] [N/A:N/A] Necrotic A mount: [2R:Fat Layer (Subcutaneous Tissue): Yes N/A] Exposed Structures: [2R:Fascia: No Tendon: No Muscle: No Joint: No Bone: No Small (1-33%)] [N/A:N/A] Epithelialization: [2R:Debridement - Excisional] [N/A:N/A] Debridement: Pre-procedure Verification/Time Out 13:05 [N/A:N/A] Taken: [2R:Lidocaine] [N/A:N/A] Pain Control: [2R:Callus, Subcutaneous] [N/A:N/A] Tissue Debrided: [2R:Skin/Subcutaneous Tissue] [N/A:N/A] Level: [2R:0.06] [N/A:N/A] Debridement A (sq cm): [2R:rea  Curette] [N/A:N/A] Instrument: [2R:Minimum] [N/A:N/A] Bleeding: [2R:Pressure] [N/A:N/A] Hemostasis A chieved: [2R:0] [N/A:N/A] Procedural Pain: [2R:0] [N/A:N/A] Post Procedural Pain: [2R:Procedure was tolerated well] [N/A:N/A] Debridement Treatment Response: [2R:0.3x0.2x0.2] [N/A:N/A] Post Debridement Measurements L x W x D (cm) [2R:0.009] [N/A:N/A] Post Debridement Volume: (cm) [2R:Debridement] [N/A:N/A] Treatment Notes Electronic Signature(s) Signed: 12/31/2020 4:37:05 PM By: Linton Ham MD Signed: 12/31/2020 5:05:22 PM By: Rhae Hammock RN Entered By: Linton Ham on 12/31/2020 13:15:30 -------------------------------------------------------------------------------- Multi-Disciplinary Care Plan Details Patient Name: Date of Service: [name] Shire ND, [name] 12/31/2020 12:30 PM Medical Record Number: [id number] Patient Account Number: [id number] Date of Birth/Sex: Treating RN: May 27, 1971 (50 y.o. [name] [name] Primary Care Idabelle Mcpeters: [name] [name] Other Clinician: Referring Aaidyn San: Treating Kyla Duffy/Extender: [name] [name] in Treatment: 19 Active Inactive Wound/Skin Impairment Nursing Diagnoses: Impaired tissue integrity Goals: Patient/caregiver will verbalize understanding of skin care regimen Date Initiated: 11/19/2020 Target Resolution Date: 01/14/2021 Goal Status: Active Ulcer/skin breakdown will have a volume reduction of 30% by week 4 Date Initiated: 08/16/2020 Target Resolution Date: 01/23/2021 Goal Status: Active Interventions: Provide education on ulcer and skin care Notes: Electronic Signature(s) Signed: 12/31/2020 5:05:22 PM By: Rhae Hammock RN Entered By: Rhae Hammock on 12/31/2020 13:02:18 -------------------------------------------------------------------------------- Pain Assessment Details Patient Name: Date of Service: [name] Shire ND, [name] 12/31/2020 12:30 PM Medical Record Number: [id number] Patient  Account Number: [id number] Date of Birth/Sex: Treating RN: 1971-10-13 (50 y.o. [name] [name] Primary Care Tacie Mccuistion: [name] [name] Other Clinician: Referring Fardowsa Authier: Treating Laykin Rainone/Extender: [name] [name] in Treatment: 19 Active Problems Location of Pain Severity and Description of Pain Patient Has Paino No Site Locations Pain Management and Medication Current Pain Management: Electronic Signature(s) Signed: 12/31/2020 5:05:22 PM By: Rhae Hammock RN Signed: 01/01/2021 8:10:33 AM By: [name] [name] Entered By: [name] [name] on 12/31/2020 12:44:20 -------------------------------------------------------------------------------- Patient/Caregiver Education Details Patient Name: Date of Service: [name] [name] 2/8/2022andnbsp12:30 PM Medical Record Number: [id number] Patient Account Number: [id number] Date of Birth/Gender: Treating RN: 1971-04-22 (50 y.o. [name] Primary Care Physician: [name] [name] Other Clinician: Referring Physician: Treating Physician/Extender: [name] [name] in Treatment: 19 Education Assessment Education Provided To: Patient Education Topics Provided Basic Hygiene: Methods: Explain/Verbal Responses: State content correctly Electronic Signature(s) Signed: 12/31/2020 5:05:22 PM By: Rhae Hammock RN Entered By: Rhae Hammock on 12/31/2020 13:02:29 -------------------------------------------------------------------------------- Wound Assessment Details Patient Name: Date of Service: [name] Shire ND, [name] 12/31/2020 12:30 PM Medical Record Number: [id number] Patient Account Number: [id number] Date of Birth/Sex: Treating RN: 1970-12-30 (50 y.o. [name] Primary Care Saundra Gin: [name] [name] Other Clinician: Referring Cintia Gleed: Treating Selby Slovacek/Extender: [name] [name] in Treatment: 19 Wound Status Wound Number: 2R  Primary Diabetic Wound/Ulcer of the Lower Extremity Etiology:  Wound Location: Left Metatarsal head first Wound Status: Open Wounding Event: Blister Comorbid Anemia, Hypertension, Peripheral Venous Disease, Type II Date Acquired: 06/23/2020 History: Diabetes Weeks Of Treatment: 19 Clustered Wound: No Wound Measurements Length: (cm) 0.3 Width: (cm) 0.2 Depth: (cm) 0.2 Area: (cm) 0.047 Volume: (cm) 0.009 % Reduction in Area: -51.6% % Reduction in Volume: -200% Epithelialization: Small (1-33%) Tunneling: No Undermining: Yes Starting Position (o'clock): 6 Ending Position (o'clock): 12 Maximum Distance: (cm) 0.3 Wound Description Classification: Grade 2 Wound Margin: Thickened Exudate Amount: Small Exudate Type: Serosanguineous Exudate Color: red, brown Foul Odor After Cleansing: No Slough/Fibrino Yes Wound Bed Granulation Amount: Large (67-100%) Exposed Structure Granulation Quality: Pink Fascia Exposed: No Necrotic Amount: None Present (0%) Fat Layer (Subcutaneous Tissue) Exposed: Yes Tendon Exposed: No Muscle Exposed: No Joint Exposed: No Bone Exposed: No Treatment Notes Wound #2R (Metatarsal head first) Wound Laterality: Left Cleanser Wound Cleanser Discharge Instruction: Cleanse the wound with wound cleanser prior to applying a clean dressing using gauze sponges, not tissue or cotton balls. Wound Cleanser Discharge Instruction: Cleanse the wound with wound cleanser prior to applying a clean dressing using gauze sponges, not tissue or cotton balls. Peri-Wound Care Sween Lotion (Moisturizing lotion) Discharge Instruction: Apply moisturizing lotion as directed Topical Primary Dressing PolyMem Silver Non-Adhesive Dressing, 4.25x4.25 in Discharge Instruction: Apply to wound bed as instructed Secondary Dressing Woven Gauze Sponges 2x2 in Discharge Instruction: Apply over primary dressing as directed. Optifoam Non-Adhesive Dressing, 4x4 in Discharge Instruction:  Apply over primary dressing cut to make foam donut to help offload Secured With Conforming Stretch Gauze Bandage, Sterile 2x75 (in/in) Discharge Instruction: Secure with stretch gauze as directed. 49M Medipore H Soft Cloth Surgical T ape, 2x2 (in/yd) Discharge Instruction: Secure dressing with tape as directed. Compression Wrap Compression Stockings Add-Ons Electronic Signature(s) Signed: 12/31/2020 5:04:56 PM By: [name] [name] Signed: 12/31/2020 5:05:22 PM By: Rhae Hammock RN Entered By: [name] Hurst on 12/31/2020 12:57:56 -------------------------------------------------------------------------------- Vitals Details Patient Name: Date of Service: [name] ND, [name] 12/31/2020 12:30 PM Medical Record Number: [id number] Patient Account Number: [id number] Date of Birth/Sex: Treating RN: [birth year] (50 y.o. [name] [name] Primary Care Alston Berrie: [name] [name] Other Clinician: Referring Felcia Huebert: Treating Marquarius Lofton/Extender: [name] [name] in Treatment: 19 Vital Signs Time Taken: 12:43 Temperature (F): 97.8 Height (in): 77 Pulse (bpm): 93 Weight (lbs): 232 Respiratory Rate (breaths/min): 18 Body Mass Index (BMI): 27.5 Blood Pressure (mmHg): 125/83 Capillary Blood Glucose (mg/dl): 116 Reference Range: 80 - 120 mg / dl Electronic Signature(s) Signed: 01/01/2021 8:10:33 AM By: [name] [name] Entered By: [name] [name] on 12/31/2020 12:44:14

## 2021-01-14 ENCOUNTER — Other Ambulatory Visit: Payer: Self-pay

## 2021-01-14 ENCOUNTER — Encounter (HOSPITAL_BASED_OUTPATIENT_CLINIC_OR_DEPARTMENT_OTHER): Payer: Medicare Other | Admitting: Internal Medicine

## 2021-01-14 DIAGNOSIS — E11621 Type 2 diabetes mellitus with foot ulcer: Secondary | ICD-10-CM | POA: Diagnosis not present

## 2021-01-14 NOTE — Progress Notes (Signed)
ARIAN, MURLEY ([id number]) Visit Report for 01/14/2021 Debridement Details Patient Name: Date of Service: [name] [name] 01/14/2021 12:30 PM Medical Record Number: [id number] Patient Account Number: [id number] Date of Birth/Sex: Treating RN: 1971-01-18 (50 y.o. [name] [name], [name] Primary Care Provider: [name] Other Clinician: Referring Provider: Treating Provider/Extender: Aurora Mask in Treatment: 21 Debridement Performed for Assessment: Wound #2R Left Metatarsal head first Performed By: Physician [name] [name]., MD Debridement Type: Debridement Severity of Tissue Pre Debridement: Fat layer exposed Level of Consciousness (Pre-procedure): Awake and Alert Pre-procedure Verification/Time Out Yes - 13:03 Taken: Start Time: 13:03 Pain Control: Lidocaine T Area Debrided (L x W): otal 0.3 (cm) x 0.2 (cm) = 0.06 (cm) Tissue and other material debrided: Viable, Non-Viable, Callus, Subcutaneous, Skin: Dermis , Skin: Epidermis Level: Skin/Subcutaneous Tissue Debridement Description: Excisional Instrument: Curette Bleeding: Minimum Hemostasis Achieved: Pressure End Time: 13:04 Procedural Pain: 0 Post Procedural Pain: 0 Response to Treatment: Procedure was tolerated well Level of Consciousness (Post- Awake and Alert procedure): Post Debridement Measurements of Total Wound Length: (cm) 0.3 Width: (cm) 0.2 Depth: (cm) 0.2 Volume: (cm) 0.009 Character of Wound/Ulcer Post Debridement: Improved Severity of Tissue Post Debridement: Fat layer exposed Post Procedure Diagnosis Same as Pre-procedure Electronic Signature(s) Signed: 01/14/2021 5:22:30 PM By: Linton Ham MD Signed: 01/14/2021 5:43:17 PM By: Rhae Hammock RN Entered By: Linton Ham on 01/14/2021 13:06:17 -------------------------------------------------------------------------------- HPI Details Patient Name: Date of Service: [name] [name] ND, [name] 01/14/2021  12:30 PM Medical Record Number: [id number] Patient Account Number: [id number] Date of Birth/Sex: Treating RN: 1970-11-26 (50 y.o. [name] [name] Primary Care Provider: [name] Other Clinician: Referring Provider: Treating Provider/Extender: Aurora Mask in Treatment: 21 History of Present Illness HPI Description: 04/21/18 ADMISSION This is a 50 year old man who is a type II diabetic. In spite of fact his hemoglobin A1c is actually quite good 5.73 months ago he is at a really difficult time over the last 6-7 months. He developed a rapidly progressive infection in the right foot in November 2018 associated with osteomyelitis and necrotizing fasciitis. 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 The stump required and a revision on 11/24/17. The stump revision was advertised as being secondary to falls. I'm not sure the progressive history here however this area is actually closed over. The patient tells me over the same timeframe he has had wounds on the plantar aspect of his left foot. In the ED saw Dr. Sharol Given in April of this year. 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 It is really not clear that this patient is been dressing this with anything. He came in with the clinic without any specific dressing on the wound areas using his own tennis shoe. He tells me that he only ambulates of course to do a pivot transfer. He does not have his prosthesis for the right leg as of yet and he blames Medicaid for this. He does however use the foot to push himself along in his wheelchair at home. The patient has not had formal arterial studies. ABI in our clinic on the left was 1.13. Patient's past medical history includes type 2 diabetes, fracture of the right fibula. I see that he was treated for abscesses on his right buttock and chest in 2016. I did not look at the microbiology of this. 04/28/18; patient comes back in the clinic  today with the wound pretty much the same as when he  came in here last week. Small opening lots of undermining relatively. He tells [place] that nothing is really been on this for 3 days in spite of the fact that we gave him enough to dress this easily especially such a small wound. He says he lives with his mother, she is not capable of assisting with this he is changing the dressings himself. 05/05/18; much better-looking wound today. Smaller. There is some undermining medially however that's only perhaps 2 mm. No surrounding erythema. We've been using silver collagen 05/12/18; small wound on the first metatarsal head. No undermining no surrounding erythema. We've been using silver collagen he has home health coming out to change 05/20/18 the wound on the first metatarsal head looks better. Covered in surface debris/callus nonviable tissue. Required debridement but post debridement this looks quite good. We've been using silver collagen. He has home health 05/27/18; first metatarsal head wound continues to improve. Just about completely closed. Still a lot of surface debridement callus. We've been using silver collagen 06/06/18; the first metatarsal wound is completely healed over. There is still a lot of callus. I gently removed some of this just to make sure that there was no open area and there is not. The patient mentions to me that his left leg has felt like "lead" for about a week READMISSION 08/16/2020 This is a 50 year old man with type 2 diabetes. He returns to clinic today with a 1 month history of a blister and callus over the first metatarsal head. This is in exactly the same area as when he was here in 2019. 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 He has standard running shoes on the left foot to match the area in his prosthesis. He has only been covering this area with a Band-Aid has not really been specifically dressing this. ABI in our clinic  on the left was 1.16. This is essentially stable from the value in 2019 10/1 the patient has a linear wound over the left first metatarsal head. Again not a lot of callus thick skin around this that I removed with a #3 curette. This is not go deep to bone or muscle it does not look to be infected. 10/8 first metatarsal head. The wound looks like it is closing however this is going to be a difficult area to offload in the future. He has a size 15 shoe and he says his shoes are years old. The inserts in these current shoes are totally useless and I have advised him to replace these. He may need a new pair of shoes and I have he got original prosthesis at [employer] [place] I have asked him to go back there. 11/9; the patient has not been here in more than a month. Not sure he was healed when he was here the last time. I told him he would have to get new shoes and certainly offload the area over the left first metatarsal head. He has done nothing of the kind. He arrives back in clinic with the same old new balance sneakers. A very large separating callus over the first metatarsal head on the left 11/16; he has purchased new shoes and has at least 2 insoles like I asked. The wound is measuring much smaller. He is using silver alginate he changes the dressing himself 11/30; wound is measurably smaller in length of about a half a centimeter. This is generally very little depth. We have  been using silver alginate. He changes the dressing himself every second day. 12/14; wound is about the same size. Significant undermining medially relative to the size of the wound. We have been using silver alginate. There is not a way to offload this area as he walks with a prosthesis on the right leg 12/28; wound is about the same size. Again he has undermining medially. I am using polymen on this 12/02/2020; the wound looks smaller. Still a senescent edge. We have been using polymen 1/24; the wound is come down a few  millimeters. Still a senescent edge. I have been using polymen 2/8; the wound is come down slightly. Still with slight undermining from 12-6 o'clock I have been using polymen partially to get offloading on this area which is opposed by his prosthesis on the other leg. 2/22 quite open improvement he still has a comma shaped wound on the left first metatarsal head. Some depth. Using polymen Electronic Signature(s) Signed: 01/14/2021 5:22:30 PM By: Linton Ham MD Entered By: Linton Ham on 01/14/2021 13:07:50 -------------------------------------------------------------------------------- Physical Exam Details Patient Name: Date of Service: [name] [name] ND, [name] 01/14/2021 12:30 PM Medical Record Number: [id number] Patient Account Number: [id number] Date of Birth/Sex: Treating RN: [birth year] (50 y.o. [name] [name] Primary Care Provider: [name] Other Clinician: Referring Provider: Treating Provider/Extender: Aurora Mask in Treatment: 21 Constitutional Sitting or standing Blood Pressure is within target range for patient.. Pulse regular and within target range for patient.Marland Kitchen Respirations regular, non-labored and within target range.. Temperature is normal and within the target range for the patient.Marland Kitchen Appears in no distress. Notes Wound exam; left plantar first metatarsal head. #3 curette skin and subcutaneous tissue removed. He has comma shaped wound with some depth. However I think this is quite a bit smaller than last time Electronic Signature(s) Signed: 01/14/2021 5:22:30 PM By: Linton Ham MD Entered By: Linton Ham on 01/14/2021 13:08:47 -------------------------------------------------------------------------------- Physician Orders Details Patient Name: Date of Service: [name] [name] ND, [name] 01/14/2021 12:30 PM Medical Record Number: [id number] Patient Account Number: [id number] Date of Birth/Sex: Treating RN: 1971-06-10 (50 y.o.  [name] [name] Primary Care Provider: [name] Other Clinician: Referring Provider: Treating Provider/Extender: Aurora Mask in Treatment: 21 Verbal / Phone Orders: No Diagnosis Coding Follow-up Appointments Return appointment in 3 weeks. Bathing/ Shower/ Hygiene May shower and wash wound with soap and water. - WITH DRESSING CHANGES. Edema Control - Lymphedema / SCD / Other Elevate legs to the level of the heart or above for 30 minutes daily and/or when sitting, a frequency of: Avoid standing for long periods of time. Off-Loading Other: - use felt for offloading Wound Treatment Wound #2R - Metatarsal head first Wound Laterality: Left Cleanser: Wound Cleanser (Generic) Every Other Day/15 Days Discharge Instructions: Cleanse the wound with wound cleanser prior to applying a clean dressing using gauze sponges, not tissue or cotton balls. Cleanser: Wound Cleanser Every Other Day/15 Days Discharge Instructions: Cleanse the wound with wound cleanser prior to applying a clean dressing using gauze sponges, not tissue or cotton balls. Peri-Wound Care: Sween Lotion (Moisturizing lotion) Every Other Day/15 Days Discharge Instructions: Apply moisturizing lotion as directed Prim Dressing: PolyMem Silver Non-Adhesive Dressing, 4.25x4.25 in (Generic) Every Other Day/15 Days ary Discharge Instructions: Apply to wound bed as instructed Secondary Dressing: Woven Gauze Sponges 2x2 in (Generic) Every Other Day/15 Days Discharge Instructions: Apply over primary dressing as directed. Secondary Dressing: Optifoam Non-Adhesive Dressing, 4x4 in (Generic) Every Other Day/15 Days Discharge Instructions: Apply  over primary dressing cut to make foam donut to help offload Secured With: Conforming Stretch Gauze Bandage, Sterile 2x75 (in/in) (Generic) Every Other Day/15 Days Discharge Instructions: Secure with stretch gauze as directed. Secured With: 76M Medipore H Soft Cloth  Surgical Tape, 2x2 (in/yd) (Generic) Every Other Day/15 Days Discharge Instructions: Secure dressing with tape as directed. Electronic Signature(s) Signed: 01/14/2021 5:22:30 PM By: Linton Ham MD Signed: 01/14/2021 5:43:17 PM By: Rhae Hammock RN Entered By: Rhae Hammock on 01/14/2021 12:55:44 -------------------------------------------------------------------------------- Problem List Details Patient Name: Date of Service: [name] [name] ND, [name] 01/14/2021 12:30 PM Medical Record Number: [id number] Patient Account Number: [id number] Date of Birth/Sex: Treating RN: 1971-01-06 (50 y.o. [name] [name], [name] Primary Care Provider: [name] Other Clinician: Referring Provider: Treating Provider/Extender: Aurora Mask in Treatment: 21 Active Problems ICD-10 Encounter Code Description Active Date MDM Diagnosis E11.621 Type 2 diabetes mellitus with foot ulcer 08/16/2020 No Yes L97.522 Non-pressure chronic ulcer of other part of left foot with fat layer exposed 08/16/2020 No Yes E11.42 Type 2 diabetes mellitus with diabetic polyneuropathy 08/16/2020 No Yes Z89.511 Acquired absence of right leg below knee 08/16/2020 No Yes Inactive Problems Resolved Problems Electronic Signature(s) Signed: 01/14/2021 5:22:30 PM By: Linton Ham MD Entered By: Linton Ham on 01/14/2021 13:05:57 -------------------------------------------------------------------------------- Progress Note Details Patient Name: Date of Service: [name] [name] ND, [name] 01/14/2021 12:30 PM Medical Record Number: [id number] Patient Account Number: [id number] Date of Birth/Sex: Treating RN: [birth year] (50 y.o. [name] [name] Primary Care Provider: [name] Other Clinician: Referring Provider: Treating Provider/Extender: Aurora Mask in Treatment: 21 Subjective History of Present Illness (HPI) 04/21/18 ADMISSION This is a 50 year old man  who is a type II diabetic. In spite of fact his hemoglobin A1c is actually quite good 5.73 months ago he is at a really difficult time over the last 6-7 months. He developed a rapidly progressive infection in the right foot in November 2018 associated with osteomyelitis and necrotizing fasciitis. 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 The stump required and a revision on 11/24/17. The stump revision was advertised as being secondary to falls. I'm not sure the progressive history here however this area is actually closed over. The patient tells me over the same timeframe he has had wounds on the plantar aspect of his left foot. In the ED saw Dr. Sharol Given in April of this year. 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 It is really not clear that this patient is been dressing this with anything. He came in with the clinic without any specific dressing on the wound areas using his own tennis shoe. He tells me that he only ambulates of course to do a pivot transfer. He does not have his prosthesis for the right leg as of yet and he blames Medicaid for this. He does however use the foot to push himself along in his wheelchair at home. The patient has not had formal arterial studies. ABI in our clinic on the left was 1.13. Patient's past medical history includes type 2 diabetes, fracture of the right fibula. I see that he was treated for abscesses on his right buttock and chest in 2016. I did not look at the microbiology of this. 04/28/18; patient comes back in the clinic today with the wound pretty much the same as when he came in here last week. Small opening lots of undermining relatively. He tells [place] that nothing is really been on  this for 3 days in spite of the fact that we gave him enough to dress this easily especially such a small wound. He says he lives with his mother, she is not capable of assisting with this he is changing the dressings himself. 05/05/18;  much better-looking wound today. Smaller. There is some undermining medially however that's only perhaps 2 mm. No surrounding erythema. We've been using silver collagen 05/12/18; small wound on the first metatarsal head. No undermining no surrounding erythema. We've been using silver collagen he has home health coming out to change 05/20/18 the wound on the first metatarsal head looks better. Covered in surface debris/callus nonviable tissue. Required debridement but post debridement this looks quite good. We've been using silver collagen. He has home health 05/27/18; first metatarsal head wound continues to improve. Just about completely closed. Still a lot of surface debridement callus. We've been using silver collagen 06/06/18; the first metatarsal wound is completely healed over. There is still a lot of callus. I gently removed some of this just to make sure that there was no open area and there is not. The patient mentions to me that his left leg has felt like "lead" for about a week READMISSION 08/16/2020 This is a 50 year old man with type 2 diabetes. He returns to clinic today with a 1 month history of a blister and callus over the first metatarsal head. This is in exactly the same area as when he was here in 2019. 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 He has standard running shoes on the left foot to match the area in his prosthesis. He has only been covering this area with a Band-Aid has not really been specifically dressing this. ABI in our clinic on the left was 1.16. This is essentially stable from the value in 2019 10/1 the patient has a linear wound over the left first metatarsal head. Again not a lot of callus thick skin around this that I removed with a #3 curette. This is not go deep to bone or muscle it does not look to be infected. 10/8 first metatarsal head. The wound looks like it is closing however this is going to be a difficult  area to offload in the future. He has a size 15 shoe and he says his shoes are years old. The inserts in these current shoes are totally useless and I have advised him to replace these. He may need a new pair of shoes and I have he got original prosthesis at [facility] [place] I have asked him to go back there. 11/9; the patient has not been here in more than a month. Not sure he was healed when he was here the last time. I told him he would have to get new shoes and certainly offload the area over the left first metatarsal head. He has done nothing of the kind. He arrives back in clinic with the same old new balance sneakers. A very large separating callus over the first metatarsal head on the left 11/16; he has purchased new shoes and has at least 2 insoles like I asked. The wound is measuring much smaller. He is using silver alginate he changes the dressing himself 11/30; wound is measurably smaller in length of about a half a centimeter. This is generally very little depth. We have been using silver alginate. He changes the dressing himself every second day. 12/14; wound is about the same size.  Significant undermining medially relative to the size of the wound. We have been using silver alginate. There is not a way to offload this area as he walks with a prosthesis on the right leg 12/28; wound is about the same size. Again he has undermining medially. I am using polymen on this 12/02/2020; the wound looks smaller. Still a senescent edge. We have been using polymen 1/24; the wound is come down a few millimeters. Still a senescent edge. I have been using polymen 2/8; the wound is come down slightly. Still with slight undermining from 12-6 o'clock I have been using polymen partially to get offloading on this area which is opposed by his prosthesis on the other leg. 2/22 quite open improvement he still has a comma shaped wound on the left first metatarsal head. Some depth. Using  polymen Objective Constitutional Sitting or standing Blood Pressure is within target range for patient.. Pulse regular and within target range for patient.Marland Kitchen Respirations regular, non-labored and within target range.. Temperature is normal and within the target range for the patient.Marland Kitchen Appears in no distress. Vitals Time Taken: 12:38 PM, Height: 77 in, Weight: 232 lbs, BMI: 27.5, Temperature: 98.3 F, Pulse: 87 bpm, Respiratory Rate: 18 breaths/min, Blood Pressure: 139/90 mmHg, Capillary Blood Glucose: 63 mg/dl. General Notes: Wound exam; left plantar first metatarsal head. #3 curette skin and subcutaneous tissue removed. He has comma shaped wound with some depth. However I think this is quite a bit smaller than last time Integumentary (Hair, Skin) Wound #2R status is Open. Original cause of wound was Blister. The date acquired was: 06/23/2020. The wound has been in treatment 21 weeks. The wound is located on the Left Metatarsal head first. The wound measures 0.3cm length x 0.2cm width x 0.2cm depth; 0.047cm^2 area and 0.009cm^3 volume. There is Fat Layer (Subcutaneous Tissue) exposed. There is no tunneling noted, however, there is undermining starting at 12:00 and ending at 12:00 with a maximum distance of 0.3cm. There is a small amount of serosanguineous drainage noted. The wound margin is thickened. There is large (67-100%) red granulation within the wound bed. There is no necrotic tissue within the wound bed. 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 There was a Excisional  Skin/Subcutaneous Tissue Debridement with a total area of 0.06 sq cm performed by [name] [name]., MD. With the following instrument(s): Curette to remove Viable and Non-Viable tissue/material. Material removed includes Callus, Subcutaneous Tissue, Skin: Dermis, and Skin: Epidermis after achieving pain control using Lidocaine. No specimens were taken. A time out was conducted at 13:03, prior to the start of the procedure. A Minimum amount of bleeding was controlled with Pressure. The procedure was tolerated well with a pain level of 0 throughout and a pain level of 0 following the procedure. Post Debridement Measurements: 0.3cm length x 0.2cm width x 0.2cm depth; 0.009cm^3 volume. Character of Wound/Ulcer Post Debridement is improved. Severity of Tissue Post Debridement is: Fat layer exposed. Post procedure Diagnosis Wound #2R: Same as Pre-Procedure Plan Follow-up Appointments: Return appointment in 3 weeks. Bathing/ Shower/ Hygiene: May shower and wash wound with soap and water. - WITH DRESSING CHANGES. Edema Control -  Lymphedema / SCD / Other: Elevate legs to the level of the heart or above for 30 minutes daily and/or when sitting, a frequency of: Avoid standing for long periods of time. Off-Loading: Other: - use felt for offloading WOUND #2R: - Metatarsal head first Wound Laterality: Left Cleanser: Wound Cleanser (Generic) Every Other Day/15 Days Discharge Instructions: Cleanse the wound with wound cleanser prior to applying a clean dressing using gauze sponges, not tissue or cotton balls. Cleanser: Wound Cleanser Every Other Day/15 Days Discharge Instructions: Cleanse the wound with wound cleanser prior to applying a clean dressing using gauze sponges, not tissue or cotton balls. Peri-Wound Care: Sween Lotion (Moisturizing lotion) Every Other Day/15 Days Discharge Instructions: Apply moisturizing lotion as directed Prim Dressing: PolyMem Silver Non-Adhesive Dressing, 4.25x4.25 in  (Generic) Every Other Day/15 Days ary Discharge Instructions: Apply to wound bed as instructed Secondary Dressing: Woven Gauze Sponges 2x2 in (Generic) Every Other Day/15 Days Discharge Instructions: Apply over primary dressing as directed. Secondary Dressing: Optifoam Non-Adhesive Dressing, 4x4 in (Generic) Every Other Day/15 Days Discharge Instructions: Apply over primary dressing cut to make foam donut to help offload Secured With: Conforming Stretch Gauze Bandage, Sterile 2x75 (in/in) (Generic) Every Other Day/15 Days Discharge Instructions: Secure with stretch gauze as directed. Secured With: 39M Medipore H Soft Cloth Surgical T ape, 2x2 (in/yd) (Generic) Every Other Day/15 Days Discharge Instructions: Secure dressing with tape as directed. 1. Small wound now on the first met head on the left although there is still some depth there is no evidence of infection 2. Still going to use polymen Ag Electronic Signature(s) Signed: 01/14/2021 5:22:30 PM By: Linton Ham MD Entered By: Linton Ham on 01/14/2021 13:14:41 -------------------------------------------------------------------------------- SuperBill Details Patient Name: Date of Service: [name] [name] ND, [name] 01/14/2021 Medical Record Number: [id number] Patient Account Number: [id number] Date of Birth/Sex: Treating RN: Apr 14, 1971 (50 y.o. 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

## 2021-01-15 NOTE — Progress Notes (Signed)
CLEDIS, SOHN ([id number]) Visit Report for 01/14/2021 Arrival Information Details Patient Name: Date of Service: [name] [name] 01/14/2021 12:30 PM Medical Record Number: [id number] Patient Account Number: [id number] Date of Birth/Sex: Treating RN: [birth year] (50 y.o. [name] [name] Primary Care Provider: [name] Other Clinician: Referring Provider: Treating Provider/Extender: Aurora Mask in Treatment: 21 Visit Information History Since Last Visit Added or deleted any medications: No Patient Arrived: Ambulatory Any new allergies or adverse reactions: No Arrival Time: 12:36 Had a fall or experienced change in No Accompanied By: self activities of daily living that may affect Transfer Assistance: None risk of falls: Patient Identification Verified: Yes Signs or symptoms of abuse/neglect since last visito No Secondary Verification Process Completed: Yes Hospitalized since last visit: No Patient Requires Transmission-Based Precautions: No Implantable device outside of the clinic excluding No Patient Has Alerts: No cellular tissue based products placed in the center since last visit: Has Dressing in Place as Prescribed: Yes Pain Present Now: No Electronic Signature(s) Signed: 01/15/2021 10:45:54 AM By: Sandre Kitty Entered By: Sandre Kitty on 01/14/2021 12:38:00 -------------------------------------------------------------------------------- Encounter Discharge Information Details Patient Name: Date of Service: [name] Shire ND, [name] 01/14/2021 12:30 PM Medical Record Number: [id number] Patient Account Number: [id number] Date of Birth/Sex: Treating RN: 02-Nov-1971 (50 y.o. [name] Primary Care Provider: [name] Other Clinician: Referring Provider: Treating Provider/Extender: Aurora Mask in Treatment: 21 Encounter Discharge Information Items Post Procedure Vitals Discharge  Condition: Stable Temperature (F): 98.3 Ambulatory Status: Ambulatory Pulse (bpm): 87 Discharge Destination: Home Respiratory Rate (breaths/min): 18 Transportation: Private Auto Blood Pressure (mmHg): 139/90 Accompanied By: alone Schedule Follow-up Appointment: Yes Clinical Summary of Care: Patient Declined Electronic Signature(s) Signed: 01/14/2021 5:33:10 PM By: Levan Hurst RN, BSN Entered By: Levan Hurst on 01/14/2021 13:21:15 -------------------------------------------------------------------------------- Lower Extremity Assessment Details Patient Name: Date of Service: [name], [name] 01/14/2021 12:30 PM Medical Record Number: [id number] Patient Account Number: [id number] Date of Birth/Sex: Treating RN: 05/25/1971 (50 y.o. [name] Primary Care Provider: [name] Other Clinician: Referring Provider: Treating Provider/Extender: Aurora Mask in Treatment: 21 Edema Assessment Assessed: [name]: No] [name]: No] Edema: [Left: N] [Right: o] Calf Left: Right: Point of Measurement: 50 cm From Medial Instep 40 cm Ankle Left: Right: Point of Measurement: 11 cm From Medial Instep 26 cm Vascular Assessment Pulses: Dorsalis Pedis Palpable: [Left:Yes] Electronic Signature(s) Signed: 01/14/2021 5:38:13 PM By: Baruch Gouty RN, BSN Entered By: Baruch Gouty on 01/14/2021 12:48:16 -------------------------------------------------------------------------------- Multi Wound Chart Details Patient Name: Date of Service: [name] Shire ND, [name] 01/14/2021 12:30 PM Medical Record Number: [id number] Patient Account Number: [id number] Date of Birth/Sex: Treating RN: [birth year] (50 y.o. [name] [name] Primary Care Provider: [name] Other Clinician: Referring Provider: Treating Provider/Extender: Aurora Mask in Treatment: 21 Vital Signs Height(in): 77 Capillary Blood Glucose(mg/dl):  63 Weight(lbs): 232 Pulse(bpm): 87 Body Mass Index(BMI): 28 Blood Pressure(mmHg): 139/90 Temperature(F): 98.3 Respiratory Rate(breaths/min): 18 Photos: [2R:No Photos Left Metatarsal head first] [N/A:N/A N/A] Wound Location: [2R:Blister] [N/A:N/A] Wounding Event: [2R:Diabetic Wound/Ulcer of the Lower] [N/A:N/A] Primary Etiology: [2R:Extremity Anemia, Hypertension, Peripheral] [N/A:N/A] Comorbid History: [2R:Venous Disease, Type II Diabetes 06/23/2020] [N/A:N/A] Date Acquired: [2R:21] [N/A:N/A] Weeks of Treatment: [2R:Open] [N/A:N/A] Wound Status: [2R:Yes] [N/A:N/A] Wound Recurrence: [2R:0.3x0.2x0.2] [N/A:N/A] Measurements L x W x D (cm) [2R:0.047] [N/A:N/A] A (cm) : rea [2R:0.009] [N/A:N/A] Volume (cm) : [2R:-51.60%] [N/A:N/A] % Reduction in A rea: [2R:-200.00%] [N/A:N/A] % Reduction in Volume: [2R:12] Starting Position 1 (o'clock): [  2R:12] Ending Position 1 (o'clock): [2R:0.3] Maximum Distance 1 (cm): [2R:Yes] [N/A:N/A] Undermining: [2R:Grade 2] [N/A:N/A] Classification: [2R:Small] [N/A:N/A] Exudate A mount: [2R:Serosanguineous] [N/A:N/A] Exudate Type: [2R:red, brown] [N/A:N/A] Exudate Color: [2R:Thickened] [N/A:N/A] Wound Margin: [2R:Large (67-100%)] [N/A:N/A] Granulation A mount: [2R:Red] [N/A:N/A] Granulation Quality: [2R:None Present (0%)] [N/A:N/A] Necrotic A mount: [2R:Fat Layer (Subcutaneous Tissue): Yes N/A] Exposed Structures: [2R:Fascia: No Tendon: No Muscle: No Joint: No Bone: No Small (1-33%)] [N/A:N/A] Epithelialization: [2R:Debridement - Excisional] [N/A:N/A] Debridement: Pre-procedure Verification/Time Out 13:03 [N/A:N/A] Taken: [2R:Lidocaine] [N/A:N/A] Pain Control: [2R:Callus, Subcutaneous] [N/A:N/A] Tissue Debrided: [2R:Skin/Subcutaneous Tissue] [N/A:N/A] Level: [2R:0.06] [N/A:N/A] Debridement A (sq cm): [2R:rea Curette] [N/A:N/A] Instrument: [2R:Minimum] [N/A:N/A] Bleeding: [2R:Pressure] [N/A:N/A] Hemostasis A chieved: [2R:0] [N/A:N/A] Procedural  Pain: [2R:0] [N/A:N/A] Post Procedural Pain: [2R:Procedure was tolerated well] [N/A:N/A] Debridement Treatment Response: [2R:0.3x0.2x0.2] [N/A:N/A] Post Debridement Measurements L x W x D (cm) [2R:0.009] [N/A:N/A] Post Debridement Volume: (cm) [2R:Debridement] [N/A:N/A] Treatment Notes Electronic Signature(s) Signed: 01/14/2021 5:22:30 PM By: Linton Ham MD Signed: 01/14/2021 5:43:17 PM By: Rhae Hammock RN Entered By: Linton Ham on 01/14/2021 13:06:04 -------------------------------------------------------------------------------- Multi-Disciplinary Care Plan Details Patient Name: Date of Service: [name] Shire ND, [name] 01/14/2021 12:30 PM Medical Record Number: [id number] Patient Account Number: [id number] Date of Birth/Sex: Treating RN: [birth year] (50 y.o. [name] [name] Primary Care Provider: [name] Other Clinician: Referring Provider: Treating Provider/Extender: Aurora Mask in Treatment: [place] reviewed with physician Active Inactive Wound/Skin Impairment Nursing Diagnoses: Impaired tissue integrity Goals: Patient/caregiver will verbalize understanding of skin care regimen Date Initiated: 11/19/2020 Target Resolution Date: 01/14/2021 Goal Status: Active Ulcer/skin breakdown will have a volume reduction of 30% by week 4 Date Initiated: 08/16/2020 Target Resolution Date: 01/23/2021 Goal Status: Active Interventions: Provide education on ulcer and skin care Notes: Electronic Signature(s) Signed: 01/14/2021 5:43:17 PM By: Rhae Hammock RN Entered By: Rhae Hammock on 01/14/2021 12:54:52 -------------------------------------------------------------------------------- Pain Assessment Details Patient Name: Date of Service: [name] Shire ND, [name] 01/14/2021 12:30 PM Medical Record Number: [id number] Patient Account Number: [id number] Date of Birth/Sex: Treating RN: 05/13/1971 (50 y.o. [name] [name] Primary Care Provider: [name] Other Clinician: Referring Provider: Treating Provider/Extender: Aurora Mask in Treatment: 21 Active Problems Location of Pain Severity and Description of Pain Patient Has Paino No Site Locations Pain Management and Medication Current Pain Management: Electronic Signature(s) Signed: 01/14/2021 5:43:17 PM By: Rhae Hammock RN Signed: 01/15/2021 10:45:54 AM By: Sandre Kitty Entered By: Sandre Kitty on 01/14/2021 12:38:34 -------------------------------------------------------------------------------- Patient/Caregiver Education Details Patient Name: Date of Service: [name] [name] 2/22/2022andnbsp12:30 PM Medical Record Number: [id number] Patient Account Number: [id number] Date of Birth/Gender: Treating RN: [birth year] (50 y.o. [name] Primary Care Physician: [name] Other Clinician: Referring Physician: Treating Physician/Extender: Aurora Mask in Treatment: 21 Education Assessment Education Provided To: Patient Education Topics Provided Wound/Skin Impairment: Methods: Explain/Verbal Responses: State content correctly [employer]) Signed: 01/14/2021 5:43:17 PM By: Rhae Hammock RN Entered By: Rhae Hammock on 01/14/2021 12:55:04 -------------------------------------------------------------------------------- Wound Assessment Details Patient Name: Date of Service: [name] Shire ND, [name] 01/14/2021 12:30 PM Medical Record Number: [id number] Patient Account Number: [id number] Date of Birth/Sex: Treating RN: 12-21-1970 (50 y.o. [name] [name] Primary Care Provider: [name] Other Clinician: Referring Provider: Treating Provider/Extender: Aurora Mask in Treatment: 21 Wound Status Wound Number: 2R Primary Diabetic Wound/Ulcer of the Lower Extremity Etiology: Wound  Location: Left Metatarsal head first Wound Status: Open Wounding Event: Blister Comorbid Anemia, Hypertension, Peripheral Venous Disease, Type II Date Acquired:  06/23/2020 History: Diabetes Weeks Of Treatment: 21 Clustered Wound: No Wound Measurements Length: (cm) 0.3 Width: (cm) 0.2 Depth: (cm) 0.2 Area: (cm) 0.047 Volume: (cm) 0.009 % Reduction in Area: -51.6% % Reduction in Volume: -200% Epithelialization: Small (1-33%) Tunneling: No Undermining: Yes Starting Position (o'clock): 12 Ending Position (o'clock): 12 Maximum Distance: (cm) 0.3 Wound Description Classification: Grade 2 Wound Margin: Thickened Exudate Amount: Small Exudate Type: Serosanguineous Exudate Color: red, brown Foul Odor After Cleansing: No Slough/Fibrino Yes Wound Bed Granulation Amount: Large (67-100%) Exposed Structure Granulation Quality: Red Fascia Exposed: No Necrotic Amount: None Present (0%) Fat Layer (Subcutaneous Tissue) Exposed: Yes Tendon Exposed: No Muscle Exposed: No Joint Exposed: No Bone Exposed: No Treatment Notes Wound #2R (Metatarsal head first) Wound Laterality: Left Cleanser Wound Cleanser Discharge Instruction: Cleanse the wound with wound cleanser prior to applying a clean dressing using gauze sponges, not tissue or cotton balls. Wound Cleanser Discharge Instruction: Cleanse the wound with wound cleanser prior to applying a clean dressing using gauze sponges, not tissue or cotton balls. Peri-Wound Care Sween Lotion (Moisturizing lotion) Discharge Instruction: Apply moisturizing lotion as directed Topical Primary Dressing PolyMem Silver Non-Adhesive Dressing, 4.25x4.25 in Discharge Instruction: Apply to wound bed as instructed Secondary Dressing Woven Gauze Sponges 2x2 in Discharge Instruction: Apply over primary dressing as directed. Optifoam Non-Adhesive Dressing, 4x4 in Discharge Instruction: Apply over primary dressing cut to make foam donut to help  offload Secured With Conforming Stretch Gauze Bandage, Sterile 2x75 (in/in) Discharge Instruction: Secure with stretch gauze as directed. 62M Medipore H Soft Cloth Surgical T ape, 2x2 (in/yd) Discharge Instruction: Secure dressing with tape as directed. Compression Wrap Compression Stockings Add-Ons Electronic Signature(s) Signed: 01/14/2021 5:38:13 PM By: Baruch Gouty RN, BSN Signed: 01/14/2021 5:43:17 PM By: Rhae Hammock RN Entered By: Baruch Gouty on 01/14/2021 12:49:39 -------------------------------------------------------------------------------- Vitals Details Patient Name: Date of Service: [name] Shire ND, [name] 01/14/2021 12:30 PM Medical Record Number: [id number] Patient Account Number: [id number] Date of Birth/Sex: Treating RN: 1971/03/17 (50 y.o. [name] [name] Primary Care Provider: [name] Other Clinician: Referring Provider: Treating Provider/Extender: Aurora Mask in Treatment: 21 Vital Signs Time Taken: 12:38 Temperature (F): 98.3 Height (in): 77 Pulse (bpm): 87 Weight (lbs): 232 Respiratory Rate (breaths/min): 18 Body Mass Index (BMI): 27.5 Blood Pressure (mmHg): 139/90 Capillary Blood Glucose (mg/dl): 63 Reference Range: 80 - 120 mg / dl Electronic Signature(s) Signed: 01/15/2021 10:45:54 AM By: Sandre Kitty Entered By: Sandre Kitty on 01/14/2021 12:38:28

## 2021-02-03 ENCOUNTER — Encounter (HOSPITAL_BASED_OUTPATIENT_CLINIC_OR_DEPARTMENT_OTHER): Payer: Medicare Other | Attending: Internal Medicine | Admitting: Internal Medicine

## 2021-02-03 ENCOUNTER — Other Ambulatory Visit: Payer: Self-pay

## 2021-02-03 DIAGNOSIS — Z89511 Acquired absence of right leg below knee: Secondary | ICD-10-CM | POA: Diagnosis not present

## 2021-02-03 DIAGNOSIS — L97522 Non-pressure chronic ulcer of other part of left foot with fat layer exposed: Secondary | ICD-10-CM | POA: Insufficient documentation

## 2021-02-03 DIAGNOSIS — E11621 Type 2 diabetes mellitus with foot ulcer: Secondary | ICD-10-CM | POA: Insufficient documentation

## 2021-02-03 DIAGNOSIS — E1142 Type 2 diabetes mellitus with diabetic polyneuropathy: Secondary | ICD-10-CM | POA: Diagnosis not present

## 2021-02-03 NOTE — Progress Notes (Signed)
[name] [name], [name] [name] ([id number]) Visit Report for 02/03/2021 Debridement Details Patient Name: Date of Service: [name] NDGreggory Brandy [name] L. 02/03/2021 2:00 PM Medical Record Number: [id number] Patient Account Number: [id number] Date of Birth/Sex: Treating RN: [birth year] (50 y.o. [name] Primary Care Provider: [name] Other Clinician: Referring Provider: Treating Provider/Extender: Aurora Mask in Treatment: 24 Debridement Performed for Assessment: Wound #2R Left Metatarsal head first Performed By: Physician Ricard Dillon., MD Debridement Type: Debridement Severity of Tissue Pre Debridement: Fat layer exposed Level of Consciousness (Pre-procedure): Awake and Alert Pre-procedure Verification/Time Out Yes - 14:30 Taken: Start Time: 14:31 T Area Debrided (L x W): otal 0.5 (cm) x 0.2 (cm) = 0.1 (cm) Tissue and other material debrided: Non-Viable, Callus, Subcutaneous Level: Skin/Subcutaneous Tissue Debridement Description: Excisional Instrument: Blade, Forceps, Scissors Bleeding: Minimum Hemostasis Achieved: Pressure End Time: 14:36 Response to Treatment: Procedure was tolerated well Level of Consciousness (Post- Awake and Alert procedure): Post Debridement Measurements of Total Wound Length: (cm) 0.5 Width: (cm) 0.2 Depth: (cm) 0.6 Volume: (cm) 0.047 Character of Wound/Ulcer Post Debridement: Stable Severity of Tissue Post Debridement: Fat layer exposed Post Procedure Diagnosis Same as Pre-procedure Electronic Signature(s) Signed: 02/03/2021 5:58:13 PM By: Linton Ham MD Signed: 02/03/2021 6:16:37 PM By: Levan Hurst RN, BSN Entered By: Linton Ham on 02/03/2021 14:55:21 -------------------------------------------------------------------------------- HPI Details Patient Name: Date of Service: [name] [name], JA [name] L. 02/03/2021 2:00 PM Medical Record Number: [id number] Patient Account Number: [id number] Date of Birth/Sex:  Treating RN: 02-18-1971 (50 y.o. [name] Primary Care Provider: [name] Other Clinician: Referring Provider: Treating Provider/Extender: Aurora Mask in Treatment: 24 History of Present Illness HPI Description: 04/21/18 ADMISSION This is a 50 year old man who is a type II diabetic. In spite of fact his hemoglobin A1c is actually quite good 5.73 months ago he is at a really difficult time over the last 6-7 months. He developed a rapidly progressive infection in the right foot in November 2018 associated with osteomyelitis and necrotizing fasciitis. 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 The stump required and a revision on 11/24/17. The stump revision was advertised as being secondary to falls. I'm not sure the progressive history here however this area is actually closed over. The patient tells me over the same timeframe he has had wounds on the plantar aspect of his left foot. In the ED saw Dr. Sharol Given in April of this year. 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 It is really not clear that this patient is been dressing this with anything. He came in with the clinic without any specific dressing on the wound areas using his own tennis shoe. He tells me that he only ambulates of course to do a pivot transfer. He does not have his prosthesis for the right leg as of yet and he blames Medicaid for this. He does however use the foot to push himself along in his wheelchair at home. The patient has not had formal arterial studies. ABI in our clinic on the left was 1.13. Patient's past medical history includes type 2 diabetes, fracture of the right fibula. I see that he was treated for abscesses on his right buttock and chest in 2016. I did not look at the microbiology of this. 04/28/18; patient comes back in the clinic today with the wound pretty much the same as when he came in here last week. Small opening lots of  undermining relatively. He tells  [place] that nothing is really been on this for 3 days in spite of the fact that we gave him enough to dress this easily especially such a small wound. He says he lives with his mother, she is not capable of assisting with this he is changing the dressings himself. 05/05/18; much better-looking wound today. Smaller. There is some undermining medially however that's only perhaps 2 mm. No surrounding erythema. We've been using silver collagen 05/12/18; small wound on the first metatarsal head. No undermining no surrounding erythema. We've been using silver collagen he has home health coming out to change 05/20/18 the wound on the first metatarsal head looks better. Covered in surface debris/callus nonviable tissue. Required debridement but post debridement this looks quite good. We've been using silver collagen. He has home health 05/27/18; first metatarsal head wound continues to improve. Just about completely closed. Still a lot of surface debridement callus. We've been using silver collagen 06/06/18; the first metatarsal wound is completely healed over. There is still a lot of callus. I gently removed some of this just to make sure that there was no open area and there is not. The patient mentions to me that his left leg has felt like "lead" for about a week READMISSION 08/16/2020 This is a 50 year old man with type 2 diabetes. He returns to clinic today with a 1 month history of a blister and callus over the first metatarsal head. This is in exactly the same area as when he was here in 2019. 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 He has standard running shoes on the left foot to match the area in his prosthesis. He has only been covering this area with a Band-Aid has not really been specifically dressing this. ABI in our clinic on the left was 1.16. This is essentially stable from the value in 2019 10/1 the patient has a  linear wound over the left first metatarsal head. Again not a lot of callus thick skin around this that I removed with a #3 curette. This is not go deep to bone or muscle it does not look to be infected. 10/8 first metatarsal head. The wound looks like it is closing however this is going to be a difficult area to offload in the future. He has a size 15 shoe and he says his shoes are years old. The inserts in these current shoes are totally useless and I have advised him to replace these. He may need a new pair of shoes and I have he got original prosthesis at [facility] [place] I have asked him to go back there. 11/9; the patient has not been here in more than a month. Not sure he was healed when he was here the last time. I told him he would have to get new shoes and certainly offload the area over the left first metatarsal head. He has done nothing of the kind. He arrives back in clinic with the same old new balance sneakers. A very large separating callus over the first metatarsal head on the left 11/16; he has purchased new shoes and has at least 2 insoles like I asked. The wound is measuring much smaller. He is using silver alginate he changes the dressing himself 11/30; wound is measurably smaller in length of about a half a centimeter. This is generally very little depth. We have been using silver alginate. He changes the dressing himself every second day. 12/14;  wound is about the same size. Significant undermining medially relative to the size of the wound. We have been using silver alginate. There is not a way to offload this area as he walks with a prosthesis on the right leg 12/28; wound is about the same size. Again he has undermining medially. I am using polymen on this 12/02/2020; the wound looks smaller. Still a senescent edge. We have been using polymen 1/24; the wound is come down a few millimeters. Still a senescent edge. I have been using polymen 2/8; the wound is come down  slightly. Still with slight undermining from 12-6 o'clock I have been using polymen partially to get offloading on this area which is opposed by his prosthesis on the other leg. 2/22 quite open improvement he still has a comma shaped wound on the left first metatarsal head. Some depth. Using polymen 3/14; he still has the same problem of a comma shaped area over the left first metatarsal head. This seems to have had more depth today at 0.6 cm. We have been using polymen. The patient changes this himself every second day. I explored the idea of a total contact cast on the left leg however this is his driving foot. He was not enamored with the idea of not being able to drive and states that he does not have anybody else to get him around Electronic Signature(s) Signed: 02/03/2021 5:58:13 PM By: Linton Ham MD Entered By: Linton Ham on 02/03/2021 14:57:02 -------------------------------------------------------------------------------- Physical Exam Details Patient Name: Date of Service: [name] [name], JA [name] L. 02/03/2021 2:00 PM Medical Record Number: [id number] Patient Account Number: [id number] Date of Birth/Sex: Treating RN: 09/21/1971 (50 y.o. [name] Primary Care Provider: [name] Other Clinician: Referring Provider: Treating Provider/Extender: Aurora Mask in Treatment: 24 Constitutional Sitting or standing Blood Pressure is within target range for patient.. Pulse regular and within target range for patient.Marland Kitchen Respirations regular, non-labored and within target range.. Temperature is normal and within the target range for the patient.Marland Kitchen Appears in no distress. Notes Wound exam; left plantar first metatarsal head. I used a #15 scalpel and pickups to remove the skin subcutaneous tissue from around the wound margin. The base of the wound actually looks healthy no debridement was required here. This does not probe to bone Electronic  Signature(s) Signed: 02/03/2021 5:58:13 PM By: Linton Ham MD Entered By: Linton Ham on 02/03/2021 14:58:01 -------------------------------------------------------------------------------- Physician Orders Details Patient Name: Date of Service: [name] [name], JA [name] L. 02/03/2021 2:00 PM Medical Record Number: [id number] Patient Account Number: [id number] Date of Birth/Sex: Treating RN: Aug 11, 1971 (50 y.o. [name] Primary Care Provider: [name] Other Clinician: Referring Provider: Treating Provider/Extender: Aurora Mask in Treatment: 24 Verbal / Phone Orders: No Diagnosis Coding Follow-up Appointments Return Appointment in 2 weeks. Bathing/ Shower/ Hygiene May shower and wash wound with soap and water. - WITH DRESSING CHANGES. Edema Control - Lymphedema / SCD / Other Elevate legs to the level of the heart or above for 30 minutes daily and/or when sitting, a frequency of: Avoid standing for long periods of time. Off-Loading Other: - use felt for offloading Wound Treatment Wound #2R - Metatarsal head first Wound Laterality: Left Cleanser: Wound Cleanser (Generic) Every Other Day/15 Days Discharge Instructions: Cleanse the wound with wound cleanser prior to applying a clean dressing using gauze sponges, not tissue or cotton balls. Cleanser: Wound Cleanser Every Other Day/15 Days Discharge Instructions: Cleanse the wound with wound cleanser prior to applying a  clean dressing using gauze sponges, not tissue or cotton balls. Peri-Wound Care: Sween Lotion (Moisturizing lotion) Every Other Day/15 Days Discharge Instructions: Apply moisturizing lotion as directed Prim Dressing: Hydrofera Blue Ready Foam, 2.5 x2.5 in Every Other Day/15 Days ary Discharge Instructions: Apply to wound bed as instructed Secondary Dressing: Woven Gauze Sponges 2x2 in (Generic) Every Other Day/15 Days Discharge Instructions: Apply over primary dressing as  directed. Secondary Dressing: Optifoam Non-Adhesive Dressing, 4x4 in (Generic) Every Other Day/15 Days Discharge Instructions: Apply over primary dressing cut to make foam donut to help offload Secured With: Conforming Stretch Gauze Bandage, Sterile 2x75 (in/in) (Generic) Every Other Day/15 Days Discharge Instructions: Secure with stretch gauze as directed. Secured With: 86M Medipore H Soft Cloth Surgical Tape, 2x2 (in/yd) (Generic) Every Other Day/15 Days Discharge Instructions: Secure dressing with tape as directed. Electronic Signature(s) Signed: 02/03/2021 5:53:54 PM By: Lorrin Jackson Signed: 02/03/2021 5:58:13 PM By: Linton Ham MD Entered By: Lorrin Jackson on 02/03/2021 14:37:59 -------------------------------------------------------------------------------- Problem List Details Patient Name: Date of Service: [name] [name], JA [name] L. 02/03/2021 2:00 PM Medical Record Number: [id number] Patient Account Number: [id number] Date of Birth/Sex: Treating RN: Oct 11, 1971 (50 y.o. [name] Primary Care Provider: [name] Other Clinician: Referring Provider: Treating Provider/Extender: Aurora Mask in Treatment: 24 Active Problems ICD-10 Encounter Code Description Active Date MDM Diagnosis E11.621 Type 2 diabetes mellitus with foot ulcer 08/16/2020 No Yes L97.522 Non-pressure chronic ulcer of other part of left foot with fat layer exposed 08/16/2020 No Yes E11.42 Type 2 diabetes mellitus with diabetic polyneuropathy 08/16/2020 No Yes Z89.511 Acquired absence of right leg below knee 08/16/2020 No Yes Inactive Problems Resolved Problems Electronic Signature(s) Signed: 02/03/2021 5:58:13 PM By: Linton Ham MD Entered By: Linton Ham on 02/03/2021 14:55:03 -------------------------------------------------------------------------------- Progress Note Details Patient Name: Date of Service: [name], JA [name] L. 02/03/2021 2:00 PM Medical Record  Number: [id number] Patient Account Number: [id number] Date of Birth/Sex: Treating RN: 05-22-1971 (50 y.o. [name] Primary Care Provider: [name] Other Clinician: Referring Provider: Treating Provider/Extender: Aurora Mask in Treatment: 24 Subjective History of Present Illness (HPI) 04/21/18 ADMISSION This is a 50 year old man who is a type II diabetic. In spite of fact his hemoglobin A1c is actually quite good 5.73 months ago he is at a really difficult time over the last 6-7 months. He developed a rapidly progressive infection in the right foot in November 2018 associated with osteomyelitis and necrotizing fasciitis. 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 The stump required and a revision on 11/24/17. The stump revision was advertised as being secondary to falls. I'm not sure the progressive history here however this area is actually closed over. The patient tells me over the same timeframe he has had wounds on the plantar aspect of his left foot. In the ED saw Dr. Sharol Given in April of this year. 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 It is really not clear that this patient is been dressing this with anything. He came in with the clinic without any specific dressing on the wound areas using his own tennis shoe. He tells me that he only ambulates of course to do a pivot transfer. He does not have his prosthesis for the right leg as of yet and he blames Medicaid for this. He does however use the foot to push himself along in his wheelchair at home. The patient has not had formal arterial studies. ABI in  our clinic on the left was 1.13. Patient's past medical history includes type 2 diabetes, fracture of the right fibula. I see that he was treated for abscesses on his right buttock and chest in 2016. I did not look at the microbiology of this. 04/28/18; patient comes back in the clinic today with the wound pretty  much the same as when he came in here last week. Small opening lots of undermining relatively. He tells [place] that nothing is really been on this for 3 days in spite of the fact that we gave him enough to dress this easily especially such a small wound. He says he lives with his mother, she is not capable of assisting with this he is changing the dressings himself. 05/05/18; much better-looking wound today. Smaller. There is some undermining medially however that's only perhaps 2 mm. No surrounding erythema. We've been using silver collagen 05/12/18; small wound on the first metatarsal head. No undermining no surrounding erythema. We've been using silver collagen he has home health coming out to change 05/20/18 the wound on the first metatarsal head looks better. Covered in surface debris/callus nonviable tissue. Required debridement but post debridement this looks quite good. We've been using silver collagen. He has home health 05/27/18; first metatarsal head wound continues to improve. Just about completely closed. Still a lot of surface debridement callus. We've been using silver collagen 06/06/18; the first metatarsal wound is completely healed over. There is still a lot of callus. I gently removed some of this just to make sure that there was no open area and there is not. The patient mentions to me that his left leg has felt like "lead" for about a week READMISSION 08/16/2020 This is a 50 year old man with type 2 diabetes. He returns to clinic today with a 1 month history of a blister and callus over the first metatarsal head. This is in exactly the same area as when he was here in 2019. 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 He has standard running shoes on the left foot to match the area in his prosthesis. He has only been covering this area with a Band-Aid has not really been specifically dressing this. ABI in our clinic on the left was 1.16. This  is essentially stable from the value in 2019 10/1 the patient has a linear wound over the left first metatarsal head. Again not a lot of callus thick skin around this that I removed with a #3 curette. This is not go deep to bone or muscle it does not look to be infected. 10/8 first metatarsal head. The wound looks like it is closing however this is going to be a difficult area to offload in the future. He has a size 15 shoe and he says his shoes are years old. The inserts in these current shoes are totally useless and I have advised him to replace these. He may need a new pair of shoes and I have he got original prosthesis at [facility] [place] I have asked him to go back there. 11/9; the patient has not been here in more than a month. Not sure he was healed when he was here the last time. I told him he would have to get new shoes and certainly offload the area over the left first metatarsal head. He has done nothing of the kind. He arrives back in clinic with the same old new balance sneakers. A  very large separating callus over the first metatarsal head on the left 11/16; he has purchased new shoes and has at least 2 insoles like I asked. The wound is measuring much smaller. He is using silver alginate he changes the dressing himself 11/30; wound is measurably smaller in length of about a half a centimeter. This is generally very little depth. We have been using silver alginate. He changes the dressing himself every second day. 12/14; wound is about the same size. Significant undermining medially relative to the size of the wound. We have been using silver alginate. There is not a way to offload this area as he walks with a prosthesis on the right leg 12/28; wound is about the same size. Again he has undermining medially. I am using polymen on this 12/02/2020; the wound looks smaller. Still a senescent edge. We have been using polymen 1/24; the wound is come down a few millimeters. Still a  senescent edge. I have been using polymen 2/8; the wound is come down slightly. Still with slight undermining from 12-6 o'clock I have been using polymen partially to get offloading on this area which is opposed by his prosthesis on the other leg. 2/22 quite open improvement he still has a comma shaped wound on the left first metatarsal head. Some depth. Using polymen 3/14; he still has the same problem of a comma shaped area over the left first metatarsal head. This seems to have had more depth today at 0.6 cm. We have been using polymen. The patient changes this himself every second day. I explored the idea of a total contact cast on the left leg however this is his driving foot. He was not enamored with the idea of not being able to drive and states that he does not have anybody else to get him around Objective Constitutional Sitting or standing Blood Pressure is within target range for patient.. Pulse regular and within target range for patient.Marland Kitchen Respirations regular, non-labored and within target range.. Temperature is normal and within the target range for the patient.Marland Kitchen Appears in no distress. Vitals Time Taken: 1:51 PM, Height: 77 in, Weight: 232 lbs, BMI: 27.5, Temperature: 98.4 F, Pulse: 99 bpm, Respiratory Rate: 17 breaths/min, Blood Pressure: 140/88 mmHg, Capillary Blood Glucose: 73 mg/dl. General Notes: Wound exam; left plantar first metatarsal head. I used a #15 scalpel and pickups to remove the skin subcutaneous tissue from around the wound margin. The base of the wound actually looks healthy no debridement was required here. This does not probe to bone Integumentary (Hair, Skin) Wound #2R status is Open. Original cause of wound was Blister. The date acquired was: 06/23/2020. The wound has been in treatment 24 weeks. The wound is located on the Left Metatarsal head first. The wound measures 0.5cm length x 0.2cm width x 0.6cm depth; 0.079cm^2 area and 0.047cm^3 volume. There is Fat  Layer (Subcutaneous Tissue) exposed. There is no tunneling or undermining noted. There is a small amount of serosanguineous drainage noted. The wound margin is thickened. There is large (67-100%) red granulation within the wound bed. There is no necrotic tissue within the wound bed. 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 There was a Excisional Skin/Subcutaneous Tissue Debridement with a total area of 0.1 sq cm performed by Ricard Dillon., MD. With the following instrument(s): Blade, Forceps, and Scissors to remove Non-Viable tissue/material. Material removed includes Callus and Subcutaneous Tissue and. No specimens were taken. A time out was conducted at 14:30, prior to the start of the procedure. A Minimum amount of bleeding was controlled with Pressure. The procedure was tolerated well. Post Debridement Measurements: 0.5cm length x 0.2cm width x 0.6cm depth; 0.047cm^3 volume. Character of Wound/Ulcer Post Debridement is stable. Severity of Tissue Post Debridement is: Fat layer exposed. Post procedure Diagnosis Wound #2R: Same as Pre-Procedure Plan Follow-up Appointments: Return Appointment in 2 weeks. Bathing/ Shower/ Hygiene: May shower and wash wound with soap and water. - WITH DRESSING CHANGES. Edema Control - Lymphedema / SCD / Other: Elevate legs to the level of the heart or above for 30 minutes daily and/or when sitting, a frequency of: Avoid standing for long periods of time. Off-Loading: Other: - use felt for offloading WOUND #2R: - Metatarsal head first Wound  Laterality: Left Cleanser: Wound Cleanser (Generic) Every Other Day/15 Days Discharge Instructions: Cleanse the wound with wound cleanser prior to applying a clean dressing using gauze sponges, not tissue or cotton balls. Cleanser: Wound Cleanser Every Other Day/15 Days Discharge Instructions: Cleanse the wound with wound cleanser prior to applying a clean dressing using gauze sponges, not tissue or cotton balls. Peri-Wound Care: Sween Lotion (Moisturizing lotion) Every Other Day/15 Days Discharge Instructions: Apply moisturizing lotion as directed Prim Dressing: Hydrofera Blue Ready Foam, 2.5 x2.5 in Every Other Day/15 Days ary Discharge Instructions: Apply to wound bed as instructed Secondary Dressing: Woven Gauze Sponges 2x2 in (Generic) Every Other Day/15 Days Discharge Instructions: Apply over primary dressing as directed. Secondary Dressing: Optifoam Non-Adhesive Dressing, 4x4 in (Generic) Every Other Day/15 Days Discharge Instructions: Apply over primary dressing cut to make foam donut to help offload Secured With: Conforming Stretch Gauze Bandage, Sterile 2x75 (in/in) (Generic) Every Other Day/15 Days Discharge Instructions: Secure with stretch gauze as directed. Secured With: 81M Medipore H Soft Cloth Surgical T ape, 2x2 (in/yd) (Generic) Every Other Day/15 Days Discharge Instructions: Secure dressing with tape as directed. 1. I change the primary dressing to Hydrofera Blue 2. I think this is a neuropathic issue in somebody who uses a prosthesis on the right leg 3. I did explore the idea of a total contact cast on the left however the patient uses this foot to drive Electronic Signature(s) Signed: 02/03/2021 5:58:13 PM By: Linton Ham MD Entered By: Linton Ham on 02/03/2021 14:58:59 -------------------------------------------------------------------------------- SuperBill Details Patient Name: Date of Service: [name], JA [name] L. 02/03/2021 Medical Record Number:  [id number] Patient Account Number: [id number] Date of Birth/Sex: Treating RN: 08-Mar-1971 (50 y.o. [name] Primary Care Provider: [name] Other Clinician: Referring Provider: Treating Provider/Extender: Aurora Mask in Treatment: 24 Diagnosis Coding ICD-10 Codes Code Description [id number] Type 2 diabetes mellitus with foot ulcer L97.522 Non-pressure chronic ulcer of other part of left foot with fat layer exposed E11.42 Type 2 diabetes mellitus with diabetic polyneuropathy Z89.511 Acquired absence of right leg below knee Facility Procedures CPT4 Code: [id number] Description: 38756 - DEB SUBQ TISSUE 20 SQ CM/< ICD-10 Diagnosis Description L97.522 Non-pressure chronic ulcer of other part of left foot with fat layer exposed E11.621 Type 2 diabetes mellitus with foot ulcer Modifier: Quantity: 1 Physician Procedures : CPT4 Code Description Modifier [id number] - WC PHYS SUBQ  TISS 20 SQ CM ICD-10 Diagnosis Description L97.522 Non-pressure chronic ulcer of other part of left foot with fat layer exposed E11.621 Type 2 diabetes mellitus with foot ulcer Quantity: 1 Electronic Signature(s) Signed: 02/03/2021 5:58:13 PM By: Linton Ham MD Entered By: Linton Ham on 02/03/2021 14:59:09

## 2021-02-03 NOTE — Progress Notes (Addendum)
BERTRAND, VOWELS ([id number]) Visit Report for 02/03/2021 Arrival Information Details Patient Name: Date of Service: [name] Arrow [name] 02/03/2021 2:00 PM Medical Record Number: [id number] Patient Account Number: [id number] Date of Birth/Sex: Treating RN: [birth year] (50 y.o. [name], [name] Primary Care Chauntay Paszkiewicz: [name] Other Clinician: Referring Kayton Dunaj: Treating Madhuri Vacca/Extender: Aurora Mask in Treatment: 24 Visit Information History Since Last Visit Added or deleted any medications: No Patient Arrived: Ambulatory Any new allergies or adverse reactions: No Arrival Time: 13:51 Had a fall or experienced change in No Accompanied By: self activities of daily living that may affect Transfer Assistance: None risk of falls: Patient Identification Verified: Yes Signs or symptoms of abuse/neglect since last visito No Secondary Verification Process Completed: Yes Hospitalized since last visit: No Patient Requires Transmission-Based Precautions: No Implantable device outside of the clinic excluding No Patient Has Alerts: No cellular tissue based products placed in the center since last visit: Has Dressing in Place as Prescribed: Yes Pain Present Now: No Electronic Signature(s) Signed: 02/03/2021 5:40:01 PM By: Rhae Hammock RN Entered By: Rhae Hammock on 02/03/2021 13:51:40 -------------------------------------------------------------------------------- Encounter Discharge Information Details Patient Name: Date of Service: [name] [name], [name] 02/03/2021 2:00 PM Medical Record Number: [id number] Patient Account Number: [id number] Date of Birth/Sex: Treating RN: 1971-10-09 (50 y.o. [name] Primary Care Shyam Dawson: [name] Other Clinician: Referring Arush Gatliff: Treating Kharter Brew/Extender: Aurora Mask in Treatment: 24 Encounter Discharge Information Items Post Procedure Vitals Discharge  Condition: Stable Temperature (F): 98.4 Ambulatory Status: Ambulatory Pulse (bpm): 99 Discharge Destination: Home Respiratory Rate (breaths/min): 17 Transportation: Private Auto Blood Pressure (mmHg): 140/88 Accompanied By: self Schedule Follow-up Appointment: Yes Clinical Summary of Care: Electronic Signature(s) Signed: 02/03/2021 6:12:14 PM By: Deon Pilling Entered By: Deon Pilling on 02/03/2021 16:14:27 -------------------------------------------------------------------------------- Lower Extremity Assessment Details Patient Name: Date of Service: [name] NDGreggory Brandy [name] 02/03/2021 2:00 PM Medical Record Number: [id number] Patient Account Number: [id number] Date of Birth/Sex: Treating RN: 06/24/1971 (50 y.o. [name], [name] Primary Care Jermie Hippe: [name] Other Clinician: Referring Kaleiyah Polsky: Treating Celestine Prim/Extender: Aurora Mask in Treatment: 24 Edema Assessment Assessed: [name]: Yes] [name]: No] Edema: [Left: N] [Right: o] Calf Left: Right: Point of Measurement: 50 cm From Medial Instep 40 cm Ankle Left: Right: Point of Measurement: 11 cm From Medial Instep 26 cm Vascular Assessment Pulses: Dorsalis Pedis Palpable: [Left:Yes] Posterior Tibial Palpable: [Left:Yes] Electronic Signature(s) Signed: 02/03/2021 5:40:01 PM By: Rhae Hammock RN Entered By: Rhae Hammock on 02/03/2021 13:58:34 -------------------------------------------------------------------------------- Multi Wound Chart Details Patient Name: Date of Service: [name] [name], [name] 02/03/2021 2:00 PM Medical Record Number: [id number] Patient Account Number: [id number] Date of Birth/Sex: Treating RN: [birth year] (50 y.o. [name] Primary Care Tanijah Morais: [name] Other Clinician: Referring Aurianna Earlywine: Treating Madysin Crisp/Extender: Aurora Mask in Treatment: 24 Vital Signs Height(in): 77 Capillary Blood  Glucose(mg/dl): 73 Weight(lbs): 232 Pulse(bpm): 99 Body Mass Index(BMI): 28 Blood Pressure(mmHg): 140/88 Temperature(F): 98.4 Respiratory Rate(breaths/min): 17 Photos: [2R:No Photos Left Metatarsal head first] [N/A:N/A N/A] Wound Location: [2R:Blister] [N/A:N/A] Wounding Event: [2R:Diabetic Wound/Ulcer of the Lower] [N/A:N/A] Primary Etiology: [2R:Extremity Anemia, Hypertension, Peripheral] [N/A:N/A] Comorbid History: [2R:Venous Disease, Type II Diabetes 06/23/2020] [N/A:N/A] Date Acquired: [2R:24] [N/A:N/A] Weeks of Treatment: [2R:Open] [N/A:N/A] Wound Status: [2R:Yes] [N/A:N/A] Wound Recurrence: [2R:0.5x0.2x0.6] [N/A:N/A] Measurements L x W x D (cm) [2R:0.079] [N/A:N/A] A (cm) : rea [2R:0.047] [N/A:N/A] Volume (cm) : [2R:-154.80%] [N/A:N/A] % Reduction in A rea: [2R:-1466.70%] [N/A:N/A] % Reduction in Volume: [2R:Grade 2] [N/A:N/A] Classification: [2R:Small] [  N/A:N/A] Exudate A mount: [2R:Serosanguineous] [N/A:N/A] Exudate Type: [2R:red, brown] [N/A:N/A] Exudate Color: [2R:Thickened] [N/A:N/A] Wound Margin: [2R:Large (67-100%)] [N/A:N/A] Granulation A mount: [2R:Red] [N/A:N/A] Granulation Quality: [2R:None Present (0%)] [N/A:N/A] Necrotic A mount: [2R:Fat Layer (Subcutaneous Tissue): Yes N/A] Exposed Structures: [2R:Fascia: No Tendon: No Muscle: No Joint: No Bone: No None] [N/A:N/A] Epithelialization: [2R:Debridement - Excisional] [N/A:N/A] Debridement: Pre-procedure Verification/Time Out 14:30 [N/A:N/A] Taken: [2R:Callus, Subcutaneous] [N/A:N/A] Tissue Debrided: [2R:Skin/Subcutaneous Tissue] [N/A:N/A] Level: [2R:0.1] [N/A:N/A] Debridement A (sq cm): [2R:rea Blade, Forceps, Scissors] [N/A:N/A] Instrument: [2R:Minimum] [N/A:N/A] Bleeding: [2R:Pressure] [N/A:N/A] Hemostasis A chieved: [2R:Procedure was tolerated well] [N/A:N/A] Debridement Treatment Response: [2R:0.5x0.2x0.6] [N/A:N/A] Post Debridement Measurements L x W x D (cm) [2R:0.047] [N/A:N/A] Post Debridement  Volume: (cm) [2R:Debridement] [N/A:N/A] Treatment Notes Electronic Signature(s) Signed: 02/03/2021 5:58:13 PM By: Linton Ham MD Signed: 02/03/2021 6:16:37 PM By: Levan Hurst RN, BSN Entered By: Linton Ham on 02/03/2021 14:55:12 -------------------------------------------------------------------------------- Multi-Disciplinary Care Plan Details Patient Name: Date of Service: [name] [name], [name] 02/03/2021 2:00 PM Medical Record Number: [id number] Patient Account Number: [id number] Date of Birth/Sex: Treating RN: [birth year] (50 y.o. [name] Primary Care Lorenzo Pereyra: [name] Other Clinician: Referring Akeelah Seppala: Treating Trinidee Schrag/Extender: Aurora Mask in Treatment: [place] reviewed with physician Active Inactive Wound/Skin Impairment Nursing Diagnoses: Impaired tissue integrity Goals: Patient/caregiver will verbalize understanding of skin care regimen Date Initiated: 11/19/2020 Target Resolution Date: 03/10/2021 Goal Status: Active Ulcer/skin breakdown will have a volume reduction of 30% by week 4 Date Initiated: 08/16/2020 Target Resolution Date: 03/10/2021 Goal Status: Active Interventions: Provide education on ulcer and skin care Notes: Electronic Signature(s) Signed: 02/03/2021 5:53:54 PM By: Lorrin Jackson Signed: 02/03/2021 6:16:37 PM By: Levan Hurst RN, BSN Entered By: Lorrin Jackson on 02/03/2021 14:34:45 -------------------------------------------------------------------------------- Pain Assessment Details Patient Name: Date of Service: [name] [name], [name] 02/03/2021 2:00 PM Medical Record Number: [id number] Patient Account Number: [id number] Date of Birth/Sex: Treating RN: Oct 23, 1971 (50 y.o. [name], [name] Primary Care Syliva Mee: [name] Other Clinician: Referring Nini Cavan: Treating Kiyra Slaubaugh/Extender: Aurora Mask in Treatment: 24 Active  Problems Location of Pain Severity and Description of Pain Patient Has Paino No Site Locations Pain Management and Medication Current Pain Management: Electronic Signature(s) Signed: 02/03/2021 5:40:01 PM By: Rhae Hammock RN Entered By: Rhae Hammock on 02/03/2021 13:54:13 -------------------------------------------------------------------------------- Patient/Caregiver Education Details Patient Name: Date of Service: [name] [name], Payton Doughty 3/14/2022andnbsp2:00 PM Medical Record Number: [id number] Patient Account Number: [id number] Date of Birth/Gender: Treating RN: 1971-04-24 (50 y.o. [name] Primary Care Physician: [name] Other Clinician: Referring Physician: Treating Physician/Extender: Aurora Mask in Treatment: 24 Education Assessment Education Provided To: Patient Education Topics Provided Offloading: Methods: Explain/Verbal, Printed Responses: State content correctly Wound/Skin Impairment: Methods: Demonstration, Explain/Verbal, Printed Responses: State content correctly Electronic Signature(s) Signed: 02/03/2021 5:53:54 PM By: Lorrin Jackson Entered By: Lorrin Jackson on 02/03/2021 14:35:07 -------------------------------------------------------------------------------- Wound Assessment Details Patient Name: Date of Service: [name] [name], [name] 02/03/2021 2:00 PM Medical Record Number: [id number] Patient Account Number: [id number] Date of Birth/Sex: Treating RN: 1971-05-19 (50 y.o. [name], [name] Primary Care Dioselina Brumbaugh: [name] Other Clinician: Referring Walda Hertzog: Treating Ciria Bernardini/Extender: Aurora Mask in Treatment: 24 Wound Status Wound Number: 2R Primary Diabetic Wound/Ulcer of the Lower Extremity Etiology: Wound Location: Left Metatarsal head first Wound Status: Open Wounding Event: Blister Comorbid Anemia, Hypertension, Peripheral Venous Disease, Type II Date  Acquired: 06/23/2020 History: Diabetes Weeks Of Treatment: 24 Clustered Wound: No Photos Wound Measurements Length: (cm) 0.5 Width: (cm) 0.2  Depth: (cm) 0.6 Area: (cm) 0.079 Volume: (cm) 0.047 % Reduction in Area: -154.8% % Reduction in Volume: -1466.7% Epithelialization: None Tunneling: No Undermining: No Wound Description Classification: Grade 2 Wound Margin: Thickened Exudate Amount: Small Exudate Type: Serosanguineous Exudate Color: red, brown Foul Odor After Cleansing: No Slough/Fibrino Yes Wound Bed Granulation Amount: Large (67-100%) Exposed Structure Granulation Quality: Red Fascia Exposed: No Necrotic Amount: None Present (0%) Fat Layer (Subcutaneous Tissue) Exposed: Yes Tendon Exposed: No Muscle Exposed: No Joint Exposed: No Bone Exposed: No Treatment Notes Wound #2R (Metatarsal head first) Wound Laterality: Left Cleanser Wound Cleanser Discharge Instruction: Cleanse the wound with wound cleanser prior to applying a clean dressing using gauze sponges, not tissue or cotton balls. Wound Cleanser Discharge Instruction: Cleanse the wound with wound cleanser prior to applying a clean dressing using gauze sponges, not tissue or cotton balls. Peri-Wound Care Sween Lotion (Moisturizing lotion) Discharge Instruction: Apply moisturizing lotion as directed Topical Primary Dressing Hydrofera Blue Ready Foam, 2.5 x2.5 in Discharge Instruction: Apply to wound bed as instructed Secondary Dressing Woven Gauze Sponges 2x2 in Discharge Instruction: Apply over primary dressing as directed. Optifoam Non-Adhesive Dressing, 4x4 in Discharge Instruction: Apply over primary dressing cut to make foam donut to help offload Secured With Conforming Stretch Gauze Bandage, Sterile 2x75 (in/in) Discharge Instruction: Secure with stretch gauze as directed. 10M Medipore H Soft Cloth Surgical T ape, 2x2 (in/yd) Discharge Instruction: Secure dressing with tape as directed. Compression  Wrap Compression Stockings Add-Ons Electronic Signature(s) Signed: 02/04/2021 5:27:17 PM By: Sandre Kitty Signed: 02/05/2021 5:17:38 PM By: Rhae Hammock RN Previous Signature: 02/03/2021 5:40:01 PM Version By: Rhae Hammock RN Entered By: Sandre Kitty on 02/04/2021 17:11:04 -------------------------------------------------------------------------------- Vitals Details Patient Name: Date of Service: [name] [name], [name] 02/03/2021 2:00 PM Medical Record Number: [id number] Patient Account Number: [id number] Date of Birth/Sex: Treating RN: 13-Mar-1971 (50 y.o. [name], [name] Primary Care Chazlyn Cude: [name] Other Clinician: Referring Sabastion Hrdlicka: Treating Jona Zappone/Extender: Aurora Mask in Treatment: 24 Vital Signs Time Taken: 13:51 Temperature (F): 98.4 Height (in): 77 Pulse (bpm): 99 Weight (lbs): 232 Respiratory Rate (breaths/min): 17 Body Mass Index (BMI): 27.5 Blood Pressure (mmHg): 140/88 Capillary Blood Glucose (mg/dl): 73 Reference Range: 80 - 120 mg / dl Electronic Signature(s) Signed: 02/03/2021 5:40:01 PM By: Rhae Hammock RN Entered By: Rhae Hammock on 02/03/2021 13:52:16

## 2021-02-04 ENCOUNTER — Encounter (HOSPITAL_BASED_OUTPATIENT_CLINIC_OR_DEPARTMENT_OTHER): Payer: Medicare Other | Admitting: Internal Medicine

## 2021-02-05 ENCOUNTER — Other Ambulatory Visit: Payer: Self-pay

## 2021-02-05 ENCOUNTER — Ambulatory Visit (INDEPENDENT_AMBULATORY_CARE_PROVIDER_SITE_OTHER): Payer: Medicare Other | Admitting: Endocrinology

## 2021-02-05 VITALS — BP 130/80 | HR 91 | Ht 77.0 in | Wt 230.8 lb

## 2021-02-05 DIAGNOSIS — E1165 Type 2 diabetes mellitus with hyperglycemia: Secondary | ICD-10-CM

## 2021-02-05 LAB — POCT GLYCOSYLATED HEMOGLOBIN (HGB A1C): Hemoglobin A1C: 7.8 % — AB (ref 4.0–5.6)

## 2021-02-05 MED ORDER — TRULICITY 1.5 MG/0.5ML ~~LOC~~ SOAJ: 1.5000 mg | SUBCUTANEOUS | 3 refills | Status: DC

## 2021-02-05 NOTE — Patient Instructions (Addendum)
good diet and exercise significantly improve the control of your diabetes.  please let me know if you wish to be referred to a dietician.  high blood sugar is very risky to your health.  you should see an eye doctor and dentist every year.  It is very important to get all recommended vaccinations.  Controlling your blood pressure and cholesterol drastically reduces the damage diabetes does to your body.  Those who smoke should quit.  Please discuss these with your doctor.  check your blood sugar 4 times a day.  vary the time of day when you check, between before the 3 meals, and at bedtime.  also check if you have symptoms of your blood sugar being too high or too low.  please keep a record of the readings and bring it to your next appointment here (or you can bring the meter itself).  You can write it on any piece of paper.  please call us sooner if your blood sugar goes below 70, or if most of your readings are over 200. We will need to take this complex situation in stages First, please reduce the Trulicity to 1.5 mg per week.  You will probably need insulin to control your blood sugar.   Please come back for a follow-up appointment in 6 weeks.

## 2021-02-05 NOTE — Progress Notes (Signed)
Subjective:    Patient ID: [name] [name], male    DOB: 1971-01-02, 50 y.o.   MRN: [id number]  HPI pt is referred by [name], NP, for diabetes.  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  He takes 3 oral meds, and Trulicity 3 mg/week.  He has intermitt n/v.  He says cbg varies from 55-200.   Past Medical History:  Diagnosis Date  . Allergy    seasonal and environmental  . ARF (acute renal failure) ([place]) 09/2017  . Chronic constipation 05/13/2020  . Depression   . Diabetes mellitus without complication ([facility]) 9371  . Hyperlipidemia   . Hypertension   . Necrotizing fasciitis ([place]) 10/13/2017  . Neuromuscular disorder ([facility])    neuropathy  . Wound dehiscence 11/24/2017    Past Surgical History:  Procedure Laterality Date  . AMPUTATION Right 10/13/2017   Procedure: RIGHT BELOW KNEE AMPUTATION;  Surgeon: [name], MD;  Location: [facility];  Service: Orthopedics;  Laterality: Right;  . AMPUTATION Right 11/24/2017   Procedure: AMPUTATION BELOW KNEE REVISION;  Surgeon: [name], MD;  Location: [facility];  Service: Orthopedics;  Laterality: Right;  . COLONOSCOPY  05/11/2019  . NO PAST SURGERIES    . POLYPECTOMY    . WISDOM TOOTH EXTRACTION      Social History   Socioeconomic History  . Marital status: Divorced    Spouse name: Not on file  . Number of children: 1  . Years of education: 22  . Highest education level: Associate degree: occupational, Hotel manager, or vocational program  Occupational History  . Occupation: unemployed  Tobacco Use  . Smoking status: Current Every Day Smoker    Packs/day: 0.25    Years: 35.00    Pack years: 8.75    Types: Cigarettes  . Smokeless tobacco: Never Used  . Tobacco comment: stopped, but back on 1 cigarette daily.   Vaping Use  . Vaping Use: Never used  Substance and Sexual Activity  . Alcohol use: No  . Drug use: No  . Sexual activity: Not on file  Other Topics Concern  . Not on file  Social History Narrative   Originally from  [place].   Is living with his mother, who essentially supports him now.   Social Determinants of Health   Financial Resource Strain: Not on file  Food Insecurity: Not on file  Transportation Needs: Not on file  Physical Activity: Not on file  Stress: Not on file  Social Connections: Not on file  Intimate Partner Violence: Not on file    Current Outpatient Medications on File Prior to Visit  Medication Sig Dispense Refill  . AGAMATRIX ULTRA-THIN LANCETS MISC Check blood sugars before meals twice daily 100 each 11  . amitriptyline (ELAVIL) 100 MG tablet Take 200 mg by mouth at bedtime.     Marland Kitchen amLODipine (NORVASC) 10 MG tablet Take 10 mg by mouth every morning.     . ARIPiprazole (ABILIFY) 20 MG tablet Take 20 mg by mouth every morning.    Marland Kitchen atorvastatin (LIPITOR) 10 MG tablet Take 10 mg by mouth every morning.    . baclofen (LIORESAL) 10 MG tablet 1/2 tab twice a day (Patient taking differently: Take 20 mg by mouth 2 (two) times daily.)  30 each 6  . bisacodyl (DULCOLAX) 5 MG EC tablet Take 5 mg by mouth daily as needed for moderate constipation.    . Blood Glucose Monitoring Suppl (AGAMATRIX PRESTO) w/Device KIT Check sugars twice daily before meals 1 kit 0  . Cholecalciferol-Vitamin C (VITAMIN D3-VITAMIN C PO) Take 1 tablet by mouth at bedtime.    Marland Kitchen desvenlafaxine (PRISTIQ) 100 MG 24 hr tablet Take 100 mg by mouth daily.    Marland Kitchen docusate sodium (COLACE) 100 MG capsule Take 1 capsule (100 mg total) by mouth 2 (two) times daily. (Patient taking differently: Take 200 mg by mouth daily as needed.) 10 capsule 0  . DULoxetine (CYMBALTA) 60 MG capsule Take 60 mg by mouth 2 (two) times daily.     . famotidine (PEPCID) 20 MG tablet Take 1 tablet (20 mg total) by mouth 2 (two)  times daily. 20 tablet 0  . FARXIGA 5 MG TABS tablet Take 5 mg by mouth daily.    . fluticasone (FLONASE) 50 MCG/ACT nasal spray Place 2 sprays into both nostrils daily as needed for allergies or rhinitis.     Marland Kitchen gabapentin (NEURONTIN) 300 MG capsule 1 cap by mouth in morning and midday, 2 caps by mouth at bedtime (Patient taking differently: Take 300-600 mg by mouth See admin instructions. Take one capsule (300 mg) by mouth in the morning and afternoon, take two capsules (600 mg) at bedtime) 120 capsule 11  . glipiZIDE (GLUCOTROL) 5 MG tablet 1/2 tab by mouth twice daily with meal (Patient taking differently: 5 mg. Pt states change to 5mg  tid with meals) 15 tablet 11  . glucose blood (AGAMATRIX PRESTO TEST) test strip Check blood sugars twice daily before meals 100 each 12  . lisinopril (ZESTRIL) 20 MG tablet Take 20 mg by mouth 2 (two) times daily.    . metFORMIN (GLUCOPHAGE) 1000 MG tablet Take 1 tablet (1,000 mg total) by mouth 2 (two) times daily with a meal. 60 tablet 11  . olmesartan-hydrochlorothiazide (BENICAR HCT) 40-12.5 MG tablet Take 1 tablet by mouth daily.    . ondansetron (ZOFRAN) 8 MG tablet Take 8 mg by mouth every 8 (eight) hours as needed.    . polyethylene glycol (MIRALAX / GLYCOLAX) 17 g packet Take 17 g by mouth daily. 30 each 2  . metoCLOPramide (REGLAN) 5 MG tablet Take 1 tablet (5 mg total) by mouth 3 (three) times daily before meals for 10 days. 30 tablet 0   Current Facility-Administered Medications on File Prior to Visit  Medication Dose Route Frequency Provider Last Rate Last Admin  . 0.9 %  sodium chloride infusion  500 mL Intravenous Once Sherrilyn Rist, MD        Allergies  Allergen Reactions  . Adhesive [Tape] Rash    Rash at site of tape--okay to use paper tape    Family History  Problem Relation Age of Onset  . Diabetes Mellitus II Mother   . Depression Mother   . Colon polyps Mother   . Heart disease Father        CABG x 4.  04/2017  . Colon  cancer Neg Hx   . Esophageal cancer Neg Hx   . Liver cancer Neg Hx   . Pancreatic cancer Neg Hx   . Rectal cancer Neg Hx   . Stomach cancer Neg Hx     BP 130/80 (BP Location: Right Arm, Patient Position: Sitting, Cuff Size: Normal)   Pulse 91   Ht 6\' 5"  (  1.956 m)   Wt 230 lb 12.8 oz (104.7 kg)   SpO2 98%   BMI 27.37 kg/m     Review of Systems denies weight loss, sob, and n/v.  Depression is well-controlled.     Objective:   Physical Exam VITAL SIGNS:  See vs page GENERAL: no distress Left foot is wrapped (sees wound care).  Right BKA.   PSYCH: flat affect.    A1c=7.8% Lab Results  Component Value Date   CREATININE 1.65 (H) 08/18/2019   BUN 17 08/18/2019   NA 140 08/18/2019   K 4.2 08/18/2019   CL 107 08/18/2019   CO2 25 08/18/2019       Assessment & Plan:  Type 2 DM, with stage 3 CRI: uncontrolled   Patient Instructions  good diet and exercise significantly improve the control of your diabetes.  please let me know if you wish to be referred to a dietician.  high blood sugar is very risky to your health.  you should see an eye doctor and dentist every year.  It is very important to get all recommended vaccinations.  Controlling your blood pressure and cholesterol drastically reduces the damage diabetes does to your body.  Those who smoke should quit.  Please discuss these with your doctor.  check your blood sugar 4 times a day.  vary the time of day when you check, between before the 3 meals, and at bedtime.  also check if you have symptoms of your blood sugar being too high or too low.  please keep a record of the readings and bring it to your next appointment here (or you can bring the meter itself).  You can write it on any piece of paper.  please call us sooner if your blood sugar goes below 70, or if most of your readings are over 200. We will need to take this complex situation in stages First, please reduce the Trulicity to 1.5 mg per week.  You will probably  need insulin to control your blood sugar.   Please come back for a follow-up appointment in 6 weeks.

## 2021-02-17 ENCOUNTER — Encounter (HOSPITAL_BASED_OUTPATIENT_CLINIC_OR_DEPARTMENT_OTHER): Payer: Medicare Other | Admitting: Internal Medicine

## 2021-02-17 ENCOUNTER — Other Ambulatory Visit: Payer: Self-pay

## 2021-02-17 DIAGNOSIS — E11621 Type 2 diabetes mellitus with foot ulcer: Secondary | ICD-10-CM | POA: Diagnosis not present

## 2021-02-17 NOTE — Progress Notes (Signed)
JLEN, WINTLE ([id number]) Visit Report for 02/17/2021 Debridement Details Patient Name: Date of Service: [name] [name] 02/17/2021 12:30 PM Medical Record Number: [id number] Patient Account Number: [id number] Date of Birth/Sex: Treating RN: 09-20-1971 (50 y.o. [name] [name] Primary Care Provider: [name] Other Clinician: Referring Provider: Treating Provider/Extender: Aurora Mask in Treatment: 26 Debridement Performed for Assessment: Wound #2R Left Metatarsal head first Performed By: Physician Ricard Dillon., MD Debridement Type: Debridement Severity of Tissue Pre Debridement: Fat layer exposed Level of Consciousness (Pre-procedure): Awake and Alert Pre-procedure Verification/Time Out Yes - 13:13 Taken: Start Time: 13:13 T Area Debrided (L x W): otal 0.5 (cm) x 0.2 (cm) = 0.1 (cm) Tissue and other material debrided: Viable, Non-Viable, Callus, Subcutaneous Level: Skin/Subcutaneous Tissue Debridement Description: Excisional Instrument: Curette Bleeding: Minimum Hemostasis Achieved: Pressure End Time: 13:14 Procedural Pain: 0 Post Procedural Pain: 0 Response to Treatment: Procedure was tolerated well Level of Consciousness (Post- Awake and Alert procedure): Post Debridement Measurements of Total Wound Length: (cm) 0.5 Width: (cm) 0.2 Depth: (cm) 0.5 Volume: (cm) 0.039 Character of Wound/Ulcer Post Debridement: Improved Severity of Tissue Post Debridement: Fat layer exposed Post Procedure Diagnosis Same as Pre-procedure Electronic Signature(s) Signed: 02/17/2021 5:22:38 PM By: Linton Ham MD Signed: 02/17/2021 5:28:50 PM By: Levan Hurst RN, BSN Entered By: Linton Ham on 02/17/2021 13:18:39 -------------------------------------------------------------------------------- HPI Details Patient Name: Date of Service: [name] [name] ND, [name] 02/17/2021 12:30 PM Medical Record Number: [id number] Patient Account  Number: [id number] Date of Birth/Sex: Treating RN: 03-Sep-1971 (50 y.o. [name] [name] Primary Care Provider: [name] Other Clinician: Referring Provider: Treating Provider/Extender: Aurora Mask in Treatment: 30 History of Present Illness HPI Description: 04/21/18 ADMISSION This is a 50 year old man who is a type II diabetic. In spite of fact his hemoglobin A1c is actually quite good 5.73 months ago he is at a really difficult time over the last 6-7 months. He developed a rapidly progressive infection in the right foot in November 2018 associated with osteomyelitis and necrotizing fasciitis. 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 The stump required and a revision on 11/24/17. The stump revision was advertised as being secondary to falls. I'm not sure the progressive history here however this area is actually closed over. The patient tells me over the same timeframe he has had wounds on the plantar aspect of his left foot. In the ED saw Dr. Sharol Given in April of this year. 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 It is really not clear that this patient is been dressing this with anything. He came in with the clinic without any specific dressing on the wound areas using his own tennis shoe. He tells me that he only ambulates of course to do a pivot transfer. He does not have his prosthesis for the right leg as of yet and he blames Medicaid for this. He does however use the foot to push himself along in his wheelchair at home. The patient has not had formal arterial studies. ABI in our clinic on the left was 1.13. Patient's past medical history includes type 2 diabetes, fracture of the right fibula. I see that he was treated for abscesses on his right buttock and chest in 2016. I did not look at the microbiology of this. 04/28/18; patient comes back in the clinic today with the wound pretty much the same as when he came in  here last week. Small opening  lots of undermining relatively. He tells [place] that nothing is really been on this for 3 days in spite of the fact that we gave him enough to dress this easily especially such a small wound. He says he lives with his mother, she is not capable of assisting with this he is changing the dressings himself. 05/05/18; much better-looking wound today. Smaller. There is some undermining medially however that's only perhaps 2 mm. No surrounding erythema. We've been using silver collagen 05/12/18; small wound on the first metatarsal head. No undermining no surrounding erythema. We've been using silver collagen he has home health coming out to change 05/20/18 the wound on the first metatarsal head looks better. Covered in surface debris/callus nonviable tissue. Required debridement but post debridement this looks quite good. We've been using silver collagen. He has home health 05/27/18; first metatarsal head wound continues to improve. Just about completely closed. Still a lot of surface debridement callus. We've been using silver collagen 06/06/18; the first metatarsal wound is completely healed over. There is still a lot of callus. I gently removed some of this just to make sure that there was no open area and there is not. The patient mentions to me that his left leg has felt like "lead" for about a week READMISSION 08/16/2020 This is a 50 year old man with type 2 diabetes. He returns to clinic today with a 1 month history of a blister and callus over the first metatarsal head. This is in exactly the same area as when he was here in 2019. 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 He has standard running shoes on the left foot to match the area in his prosthesis. He has only been covering this area with a Band-Aid has not really been specifically dressing this. ABI in our clinic on the left was 1.16. This is essentially stable from the  value in 2019 10/1 the patient has a linear wound over the left first metatarsal head. Again not a lot of callus thick skin around this that I removed with a #3 curette. This is not go deep to bone or muscle it does not look to be infected. 10/8 first metatarsal head. The wound looks like it is closing however this is going to be a difficult area to offload in the future. He has a size 15 shoe and he says his shoes are years old. The inserts in these current shoes are totally useless and I have advised him to replace these. He may need a new pair of shoes and I have he got original prosthesis at [employer] [place] I have asked him to go back there. 11/9; the patient has not been here in more than a month. Not sure he was healed when he was here the last time. I told him he would have to get new shoes and certainly offload the area over the left first metatarsal head. He has done nothing of the kind. He arrives back in clinic with the same old new balance sneakers. A very large separating callus over the first metatarsal head on the left 11/16; he has purchased new shoes and has at least 2 insoles like I asked. The wound is measuring much smaller. He is using silver alginate he changes the dressing himself 11/30; wound is measurably smaller in length of about a half a centimeter. This is generally very little depth. We have been using silver alginate. He changes the  dressing himself every second day. 12/14; wound is about the same size. Significant undermining medially relative to the size of the wound. We have been using silver alginate. There is not a way to offload this area as he walks with a prosthesis on the right leg 12/28; wound is about the same size. Again he has undermining medially. I am using polymen on this 12/02/2020; the wound looks smaller. Still a senescent edge. We have been using polymen 1/24; the wound is come down a few millimeters. Still a senescent edge. I have been using  polymen 2/8; the wound is come down slightly. Still with slight undermining from 12-6 o'clock I have been using polymen partially to get offloading on this area which is opposed by his prosthesis on the other leg. 2/22 quite open improvement he still has a comma shaped wound on the left first metatarsal head. Some depth. Using polymen 3/14; he still has the same problem of a comma shaped area over the left first metatarsal head. This seems to have had more depth today at 0.6 cm. We have been using polymen. The patient changes this himself every second day. I explored the idea of a total contact cast on the left leg however this is his driving foot. He was not enamored with the idea of not being able to drive and states that he does not have anybody else to get him around 3/28; again he comes in with a smaller looking wound however there is depth and undermining requiring debridement. We have been using Hydrofera Blue, changed to silver collagen today. The patient is doing the dressing himself Electronic Signature(s) Signed: 02/17/2021 5:22:38 PM By: Linton Ham MD Entered By: Linton Ham on 02/17/2021 13:19:15 -------------------------------------------------------------------------------- Physical Exam Details Patient Name: Date of Service: [name] [name] ND, [name] 02/17/2021 12:30 PM Medical Record Number: [id number] Patient Account Number: [id number] Date of Birth/Sex: Treating RN: Jun 17, 1971 (50 y.o. [name] [name] Primary Care Provider: [name] Other Clinician: Referring Provider: Treating Provider/Extender: Aurora Mask in Treatment: 26 Constitutional Sitting or standing Blood Pressure is within target range for patient.. Pulse regular and within target range for patient.Marland Kitchen Respirations regular, non-labored and within target range.. Temperature is normal and within the target range for the patient.Marland Kitchen Appears in no distress. Notes Wound exam; left  plantar first metatarsal head. I used a #3 curette to remove skin and subcutaneous tissue from around the wound surface. Hemostasis with direct pressure the base of the remaining wound looks really quite healthy. This does not go to deep structures Electronic Signature(s) Signed: 02/17/2021 5:22:38 PM By: Linton Ham MD Entered By: Linton Ham on 02/17/2021 13:19:54 -------------------------------------------------------------------------------- Physician Orders Details Patient Name: Date of Service: [name] [name] ND, [name] 02/17/2021 12:30 PM Medical Record Number: [id number] Patient Account Number: [id number] Date of Birth/Sex: Treating RN: October 05, 1971 (50 y.o. [name] [name] Primary Care Provider: [name] Other Clinician: Referring Provider: Treating Provider/Extender: Aurora Mask in Treatment: 26 Verbal / Phone Orders: No Diagnosis Coding ICD-10 Coding Code Description E11.621 Type 2 diabetes mellitus with foot ulcer L97.522 Non-pressure chronic ulcer of other part of left foot with fat layer exposed E11.42 Type 2 diabetes mellitus with diabetic polyneuropathy Z89.511 Acquired absence of right leg below knee Follow-up Appointments Return Appointment in 2 weeks. Bathing/ Shower/ Hygiene May shower and wash wound with soap and water. - WITH DRESSING CHANGES. Edema Control - Lymphedema / SCD / Other Elevate legs to the level of the heart or above  for 30 minutes daily and/or when sitting, a frequency of: Avoid standing for long periods of time. Off-Loading Other: - use felt for offloading Wound Treatment Wound #2R - Metatarsal head first Wound Laterality: Left Cleanser: Soap and Water Every Other Day/15 Days Discharge Instructions: May shower and wash wound with dial antibacterial soap and water prior to dressing change. Cleanser: Wound Cleanser (Generic) Every Other Day/15 Days Discharge Instructions: Cleanse the wound with wound cleanser  prior to applying a clean dressing using gauze sponges, not tissue or cotton balls. Peri-Wound Care: Sween Lotion (Moisturizing lotion) Every Other Day/15 Days Discharge Instructions: Apply moisturizing lotion as directed Prim Dressing: Promogran Prisma Matrix, 4.34 (sq in) (silver collagen) Every Other Day/15 Days ary Discharge Instructions: Moisten collagen with saline or hydrogel Secondary Dressing: Woven Gauze Sponges 2x2 in (Generic) Every Other Day/15 Days Discharge Instructions: Apply over primary dressing as directed. Secondary Dressing: Optifoam Non-Adhesive Dressing, 4x4 in (Generic) Every Other Day/15 Days Discharge Instructions: Apply over primary dressing cut to make foam donut to help offload Secured With: Conforming Stretch Gauze Bandage, Sterile 2x75 (in/in) (Generic) Every Other Day/15 Days Discharge Instructions: Secure with stretch gauze as directed. Secured With: 54M Medipore H Soft Cloth Surgical Tape, 2x2 (in/yd) (Generic) Every Other Day/15 Days Discharge Instructions: Secure dressing with tape as directed. Electronic Signature(s) Signed: 02/17/2021 5:22:38 PM By: Linton Ham MD Signed: 02/17/2021 5:28:50 PM By: Levan Hurst RN, BSN Entered By: Levan Hurst on 02/17/2021 13:15:46 -------------------------------------------------------------------------------- Problem List Details Patient Name: Date of Service: [name] [name] ND, [place] 02/17/2021 12:30 PM Medical Record Number: [id number] Patient Account Number: [id number] Date of Birth/Sex: Treating RN: 1971/08/06 (50 y.o. [name] [name] Primary Care Provider: [name] Other Clinician: Referring Provider: Treating Provider/Extender: Aurora Mask in Treatment: 26 Active Problems ICD-10 Encounter Code Description Active Date MDM Diagnosis E11.621 Type 2 diabetes mellitus with foot ulcer 08/16/2020 No Yes L97.522 Non-pressure chronic ulcer of other part of left foot with fat  layer exposed 08/16/2020 No Yes E11.42 Type 2 diabetes mellitus with diabetic polyneuropathy 08/16/2020 No Yes Z89.511 Acquired absence of right leg below knee 08/16/2020 No Yes Inactive Problems Resolved Problems Electronic Signature(s) Signed: 02/17/2021 5:22:38 PM By: Linton Ham MD Entered By: Linton Ham on 02/17/2021 13:18:23 -------------------------------------------------------------------------------- Progress Note Details Patient Name: Date of Service: [name] [name] ND, [name] 02/17/2021 12:30 PM Medical Record Number: [id number] Patient Account Number: [id number] Date of Birth/Sex: Treating RN: 03/17/1971 (50 y.o. [name] [name] Primary Care Provider: Other Clinician: [name] Referring Provider: Treating Provider/Extender: Aurora Mask in Treatment: 26 Subjective History of Present Illness (HPI) 04/21/18 ADMISSION This is a 50 year old man who is a type II diabetic. In spite of fact his hemoglobin A1c is actually quite good 5.73 months ago he is at a really difficult time over the last 6-7 months. He developed a rapidly progressive infection in the right foot in November 2018 associated with osteomyelitis and necrotizing fasciitis. 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 The stump required and a revision on 11/24/17. The stump revision was advertised as being secondary to falls. I'm not sure the progressive history here however this area is actually closed over. The patient tells me over the same timeframe he has had wounds on the plantar aspect of his left foot. In the ED saw Dr. Sharol Given in April of this year. 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 It is really not clear that this patient  is been dressing this with anything. He came in with the clinic without any specific dressing on the wound areas using his own tennis shoe. He tells me that he only ambulates of course to do a pivot transfer.  He does not have his prosthesis for the right leg as of yet and he blames Medicaid for this. He does however use the foot to push himself along in his wheelchair at home. The patient has not had formal arterial studies. ABI in our clinic on the left was 1.13. Patient's past medical history includes type 2 diabetes, fracture of the right fibula. I see that he was treated for abscesses on his right buttock and chest in 2016. I did not look at the microbiology of this. 04/28/18; patient comes back in the clinic today with the wound pretty much the same as when he came in here last week. Small opening lots of undermining relatively. He tells [place] that nothing is really been on this for 3 days in spite of the fact that we gave him enough to dress this easily especially such a small wound. He says he lives with his mother, she is not capable of assisting with this he is changing the dressings himself. 05/05/18; much better-looking wound today. Smaller. There is some undermining medially however that's only perhaps 2 mm. No surrounding erythema. We've been using silver collagen 05/12/18; small wound on the first metatarsal head. No undermining no surrounding erythema. We've been using silver collagen he has home health coming out to change 05/20/18 the wound on the first metatarsal head looks better. Covered in surface debris/callus nonviable tissue. Required debridement but post debridement this looks quite good. We've been using silver collagen. He has home health 05/27/18; first metatarsal head wound continues to improve. Just about completely closed. Still a lot of surface debridement callus. We've been using silver collagen 06/06/18; the first metatarsal wound is completely healed over. There is still a lot of callus. I gently removed some of this just to make sure that there was no open area and there is not. The patient mentions to me that his left leg has felt like "lead" for about a  week READMISSION 08/16/2020 This is a 50 year old man with type 2 diabetes. He returns to clinic today with a 1 month history of a blister and callus over the first metatarsal head. This is in exactly the same area as when he was here in 2019. 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 He has standard running shoes on the left foot to match the area in his prosthesis. He has only been covering this area with a Band-Aid has not really been specifically dressing this. ABI in our clinic on the left was 1.16. This is essentially stable from the value in 2019 10/1 the patient has a linear wound over the left first metatarsal head. Again not a lot of callus thick skin around this that I removed with a #3 curette. This is not go deep to bone or muscle it does not look to be infected. 10/8 first metatarsal head. The wound looks like it is closing however this is going to be a difficult area to offload in the future. He has a size 15 shoe and he says his shoes are years old. The inserts in these current shoes are totally useless and I have advised him to replace these. He may need a new pair of shoes and I  have he got original prosthesis at [employer] [place] [facility] [place] I have asked him to go back there. 11/9; the patient has not been here in more than a month. Not sure he was healed when he was here the last time. I told him he would have to get new shoes and certainly offload the area over the left first metatarsal head. He has done nothing of the kind. He arrives back in clinic with the same old new balance sneakers. A very large separating callus over the first metatarsal head on the left 11/16; he has purchased new shoes and has at least 2 insoles like I asked. The wound is measuring much smaller. He is using silver alginate he changes the dressing himself 11/30; wound is measurably smaller in length of about a half a centimeter. This is generally very little depth.  We have been using silver alginate. He changes the dressing himself every second day. 12/14; wound is about the same size. Significant undermining medially relative to the size of the wound. We have been using silver alginate. There is not a way to offload this area as he walks with a prosthesis on the right leg 12/28; wound is about the same size. Again he has undermining medially. I am using polymen on this 12/02/2020; the wound looks smaller. Still a senescent edge. We have been using polymen 1/24; the wound is come down a few millimeters. Still a senescent edge. I have been using polymen 2/8; the wound is come down slightly. Still with slight undermining from 12-6 o'clock I have been using polymen partially to get offloading on this area which is opposed by his prosthesis on the other leg. 2/22 quite open improvement he still has a comma shaped wound on the left first metatarsal head. Some depth. Using polymen 3/14; he still has the same problem of a comma shaped area over the left first metatarsal head. This seems to have had more depth today at 0.6 cm. We have been using polymen. The patient changes this himself every second day. I explored the idea of a total contact cast on the left leg however this is his driving foot. He was not enamored with the idea of not being able to drive and states that he does not have anybody else to get him around 3/28; again he comes in with a smaller looking wound however there is depth and undermining requiring debridement. We have been using Hydrofera Blue, changed to silver collagen today. The patient is doing the dressing himself Objective Constitutional Sitting or standing Blood Pressure is within target range for patient.. Pulse regular and within target range for patient.Marland Kitchen Respirations regular, non-labored and within target range.. Temperature is normal and within the target range for the patient.Marland Kitchen Appears in no distress. Vitals Time Taken: 12:52 PM,  Height: 77 in, Weight: 232 lbs, BMI: 27.5, Temperature: 97.9 F, Pulse: 88 bpm, Respiratory Rate: 17 breaths/min, Blood Pressure: 129/85 mmHg, Capillary Blood Glucose: 122 mg/dl. General Notes: Wound exam; left plantar first metatarsal head. I used a #3 curette to remove skin and subcutaneous tissue from around the wound surface. Hemostasis with direct pressure the base of the remaining wound looks really quite healthy. This does not go to deep structures Integumentary (Hair, Skin) Wound #2R status is Open. Original cause of wound was Blister. The date acquired was: 06/23/2020. The wound has been in treatment 26 weeks. The wound is located on the Left Metatarsal head first. The wound measures 0.5cm length x 0.2cm width x  0.5cm depth; 0.079cm^2 area and 0.039cm^3 volume. There is Fat Layer (Subcutaneous Tissue) exposed. There is no tunneling noted, however, there is undermining starting at 12:00 and ending at 12:00 with a maximum distance of 0.5cm. There is a medium amount of serosanguineous drainage noted. The wound margin is thickened. There is large (67-100%) red granulation within the wound bed. There is no necrotic tissue within the wound bed. 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 There was a Excisional Skin/Subcutaneous Tissue Debridement with a total area of 0.1 sq cm performed by Ricard Dillon., MD. With the following instrument(s): Curette to remove Viable and Non-Viable tissue/material. Material removed includes Callus and Subcutaneous Tissue and. No specimens were taken. A  time out was conducted at 13:13, prior to the start of the procedure. A Minimum amount of bleeding was controlled with Pressure. The procedure was tolerated well with a pain level of 0 throughout and a pain level of 0 following the procedure. Post Debridement Measurements: 0.5cm length x 0.2cm width x 0.5cm depth; 0.039cm^3 volume. Character of Wound/Ulcer Post Debridement is improved. Severity of Tissue Post Debridement is: Fat layer exposed. Post procedure Diagnosis Wound #2R: Same as Pre-Procedure Plan Follow-up Appointments: Return Appointment in 2 weeks. Bathing/ Shower/ Hygiene: May shower and wash wound with soap and water. - WITH DRESSING CHANGES. Edema Control - Lymphedema / SCD / Other: Elevate legs to the level of the heart or above for 30 minutes daily and/or when sitting, a frequency of: Avoid standing for long periods of time. Off-Loading: Other: - use felt for offloading WOUND #2R: - Metatarsal head first Wound Laterality: Left Cleanser: Soap and Water Every Other Day/15 Days Discharge Instructions: May shower and wash wound with dial antibacterial soap and water prior to dressing change. Cleanser: Wound Cleanser (Generic) Every Other Day/15 Days Discharge Instructions: Cleanse the wound with wound cleanser prior to applying a clean dressing using gauze sponges, not tissue or cotton balls. Peri-Wound Care: Sween Lotion (Moisturizing lotion) Every Other Day/15 Days Discharge Instructions: Apply moisturizing lotion as directed Prim Dressing: Promogran Prisma Matrix, 4.34 (sq in) (silver collagen) Every Other Day/15 Days ary Discharge Instructions: Moisten collagen with saline or hydrogel Secondary Dressing: Woven Gauze Sponges 2x2 in (Generic) Every Other Day/15 Days Discharge Instructions: Apply over primary dressing as directed. Secondary Dressing: Optifoam Non-Adhesive Dressing, 4x4 in (Generic) Every Other Day/15 Days Discharge Instructions: Apply over primary dressing  cut to make foam donut to help offload Secured With: Conforming Stretch Gauze Bandage, Sterile 2x75 (in/in) (Generic) Every Other Day/15 Days Discharge Instructions: Secure with stretch gauze as directed. Secured With: 41M Medipore H Soft Cloth Surgical T ape, 2x2 (in/yd) (Generic) Every Other Day/15 Days Discharge Instructions: Secure dressing with tape as directed. 1 I change the dressing to silver collagen 2. Not sure about the adequacy of the offloading here in fact I think it probably is not adequate however he is managed to heal wounds on his foot before with less than adequate offloading and a prosthesis on the other Electronic Signature(s) Signed: 02/17/2021 5:22:38 PM By: Linton Ham MD Entered By: Linton Ham on 02/17/2021 13:20:33 -------------------------------------------------------------------------------- SuperBill Details Patient Name:  Date of Service: [name] [name] 02/17/2021 Medical Record Number: [id number] Patient Account Number: [id number] Date of Birth/Sex: Treating RN: 15-Dec-1970 (50 y.o. 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 Facility Procedures CPT4 Code: [id number] Description: 09983 - DEB SUBQ TISSUE 20 SQ CM/< ICD-10 Diagnosis Description E11.621 Type 2 diabetes mellitus with foot ulcer L97.522 Non-pressure chronic ulcer of other part of left foot with fat layer exposed Modifier: Quantity: 1 Physician Procedures : CPT4 Code Description Modifier [id number] - WC PHYS SUBQ TISS 20 SQ CM ICD-10 Diagnosis Description  E11.621 Type 2 diabetes mellitus with foot ulcer L97.522 Non-pressure chronic ulcer of other part of left foot with fat layer exposed Quantity: 1 Electronic Signature(s) Signed: 02/17/2021 5:22:38 PM By: Linton Ham MD Entered By: Linton Ham on 02/17/2021 13:20:48

## 2021-02-19 NOTE — Progress Notes (Signed)
DOSSIE, SWOR ([id number]) Visit Report for 02/17/2021 Arrival Information Details Patient Name: Date of Service: [name] [name] 02/17/2021 12:30 PM Medical Record Number: [id number] Patient Account Number: [id number] Date of Birth/Sex: Treating RN: November 30, 1970 (50 y.o. [name] [name], [name] [name] Primary Care Gaven Eugene: [name] Other Clinician: Referring Koya Hunger: Treating Joye Wesenberg/Extender: Aurora Mask in Treatment: 109 Visit Information History Since Last Visit Added or deleted any medications: No Patient Arrived: Ambulatory Any new allergies or adverse reactions: No Arrival Time: 12:47 Had a fall or experienced change in No Accompanied By: self activities of daily living that may affect Transfer Assistance: None risk of falls: Patient Identification Verified: Yes Signs or symptoms of abuse/neglect since last visito No Secondary Verification Process Completed: Yes Hospitalized since last visit: No Patient Requires Transmission-Based Precautions: No Implantable device outside of the clinic excluding No Patient Has Alerts: No cellular tissue based products placed in the center since last visit: Has Dressing in Place as Prescribed: Yes Pain Present Now: No Electronic Signature(s) Signed: 02/17/2021 5:14:32 PM By: Rhae Hammock RN Entered By: Rhae Hammock on 02/17/2021 12:47:45 -------------------------------------------------------------------------------- Encounter Discharge Information Details Patient Name: Date of Service: [name] [name], [name] 02/17/2021 12:30 PM Medical Record Number: [id number] Patient Account Number: [id number] Date of Birth/Sex: Treating RN: 1971/02/10 (50 y.o. [name] Primary Care Cherica Heiden: [name] Other Clinician: Referring Mersades Barbaro: Treating Vana Arif/Extender: Aurora Mask in Treatment: 26 Encounter Discharge Information Items Post Procedure Vitals Discharge  Condition: Stable Temperature (F): 97.9 Ambulatory Status: Ambulatory Pulse (bpm): 88 Discharge Destination: Home Respiratory Rate (breaths/min): 17 Transportation: Private Auto Blood Pressure (mmHg): 129/85 Accompanied By: self Schedule Follow-up Appointment: Yes Clinical Summary of Care: Electronic Signature(s) Signed: 02/17/2021 5:16:32 PM By: Deon Pilling Entered By: Deon Pilling on 02/17/2021 17:11:45 -------------------------------------------------------------------------------- Lower Extremity Assessment Details Patient Name: Date of Service: [name] Arrow [name] 02/17/2021 12:30 PM Medical Record Number: [id number] Patient Account Number: [id number] Date of Birth/Sex: Treating RN: 1970/12/10 (50 y.o. [name] [name], [name] [name] Primary Care Brahim Dolman: [name] Other Clinician: Referring Creig Landin: Treating Lumen Brinlee/Extender: Aurora Mask in Treatment: 26 Edema Assessment Assessed: [name]: Yes] [name]: No] Edema: [Left: N] [Right: o] Calf Left: Right: Point of Measurement: 50 cm From Medial Instep 40 cm Ankle Left: Right: Point of Measurement: 11 cm From Medial Instep 26 cm Vascular Assessment Pulses: Dorsalis Pedis Palpable: [Left:Yes] Posterior Tibial Palpable: [Left:Yes] Electronic Signature(s) Signed: 02/17/2021 5:14:32 PM By: Rhae Hammock RN Entered By: Rhae Hammock on 02/17/2021 12:53:09 -------------------------------------------------------------------------------- Multi Wound Chart Details Patient Name: Date of Service: [name] [name], [name] 02/17/2021 12:30 PM Medical Record Number: [id number] Patient Account Number: [id number] Date of Birth/Sex: Treating RN: 1970-12-30 (50 y.o. [name] [name] Primary Care Markus Casten: [name] Other Clinician: Referring Veretta Sabourin: Treating Alaila Pillard/Extender: Aurora Mask in Treatment: 26 Vital Signs Height(in): 77 Capillary Blood  Glucose(mg/dl): 122 Weight(lbs): 232 Pulse(bpm): 46 Body Mass Index(BMI): 28 Blood Pressure(mmHg): 129/85 Temperature(F): 97.9 Respiratory Rate(breaths/min): 17 Photos: [2R:No Photos Left Metatarsal head first] [N/A:N/A N/A] Wound Location: [2R:Blister] [N/A:N/A] Wounding Event: [2R:Diabetic Wound/Ulcer of the Lower] [N/A:N/A] Primary Etiology: [2R:Extremity Anemia, Hypertension, Peripheral] [N/A:N/A] Comorbid History: [2R:Venous Disease, Type II Diabetes 06/23/2020] [N/A:N/A] Date Acquired: [2R:26] [N/A:N/A] Weeks of Treatment: [2R:Open] [N/A:N/A] Wound Status: [2R:Yes] [N/A:N/A] Wound Recurrence: [2R:0.5x0.2x0.5] [N/A:N/A] Measurements L x W x D (cm) [2R:0.079] [N/A:N/A] A (cm) : rea [1Z:0.017] [N/A:N/A] Volume (cm) : [2R:-154.80%] [N/A:N/A] % Reduction in A rea: [2R:-1200.00%] [N/A:N/A] % Reduction in Volume: [2R:12] Starting Position 1 (o'clock): [  2R:12] Ending Position 1 (o'clock): [2R:0.5] Maximum Distance 1 (cm): [2R:Yes] [N/A:N/A] Undermining: [2R:Grade 2] [N/A:N/A] Classification: [2R:Medium] [N/A:N/A] Exudate A mount: [2R:Serosanguineous] [N/A:N/A] Exudate Type: [2R:red, brown] [N/A:N/A] Exudate Color: [2R:Thickened] [N/A:N/A] Wound Margin: [2R:Large (67-100%)] [N/A:N/A] Granulation A mount: [2R:Red] [N/A:N/A] Granulation Quality: [2R:None Present (0%)] [N/A:N/A] Necrotic A mount: [2R:Fat Layer (Subcutaneous Tissue): Yes N/A] Exposed Structures: [2R:Fascia: No Tendon: No Muscle: No Joint: No Bone: No None] [N/A:N/A] Epithelialization: [2R:Debridement - Excisional] [N/A:N/A] Debridement: Pre-procedure Verification/Time Out 13:13 [N/A:N/A] Taken: [2R:Callus, Subcutaneous] [N/A:N/A] Tissue Debrided: [2R:Skin/Subcutaneous Tissue] [N/A:N/A] Level: [2R:0.1] [N/A:N/A] Debridement A (sq cm): [2R:rea Curette] [N/A:N/A] Instrument: [2R:Minimum] [N/A:N/A] Bleeding: [2R:Pressure] [N/A:N/A] Hemostasis A chieved: [2R:0] [N/A:N/A] Procedural Pain: [2R:0] [N/A:N/A] Post  Procedural Pain: [2R:Procedure was tolerated well] [N/A:N/A] Debridement Treatment Response: [2R:0.5x0.2x0.5] [N/A:N/A] Post Debridement Measurements L x W x D (cm) [7G:0.174] [N/A:N/A] Post Debridement Volume: (cm) [2R:Debridement] [N/A:N/A] Treatment Notes Electronic Signature(s) Signed: 02/17/2021 5:22:38 PM By: Linton Ham MD Signed: 02/17/2021 5:28:50 PM By: Levan Hurst RN, BSN Entered By: Linton Ham on 02/17/2021 13:18:29 -------------------------------------------------------------------------------- Multi-Disciplinary Care Plan Details Patient Name: Date of Service: [name] [name], [name] 02/17/2021 12:30 PM Medical Record Number: [id number] Patient Account Number: [id number] Date of Birth/Sex: Treating RN: [birth year] (50 y.o. [name] [name] Primary Care Amelia Burgard: [name] Other Clinician: Referring Saahas Hidrogo: Treating Alby Schwabe/Extender: Aurora Mask in Treatment: [place] reviewed with physician Active Inactive Wound/Skin Impairment Nursing Diagnoses: Impaired tissue integrity Goals: Patient/caregiver will verbalize understanding of skin care regimen Date Initiated: 11/19/2020 Target Resolution Date: 03/10/2021 Goal Status: Active Ulcer/skin breakdown will have a volume reduction of 30% by week 4 Date Initiated: 08/16/2020 Target Resolution Date: 03/10/2021 Goal Status: Active Interventions: Provide education on ulcer and skin care Notes: Electronic Signature(s) Signed: 02/17/2021 5:28:50 PM By: Levan Hurst RN, BSN Entered By: Levan Hurst on 02/17/2021 15:28:15 -------------------------------------------------------------------------------- Pain Assessment Details Patient Name: Date of Service: [name] [name], [name] 02/17/2021 12:30 PM Medical Record Number: [id number] Patient Account Number: [id number] Date of Birth/Sex: Treating RN: Sep 05, 1971 (50 y.o. [name] [name], [name] [name] Primary Care  Navon Kotowski: [name] Other Clinician: Referring Elizette Shek: Treating Perpetua Elling/Extender: Aurora Mask in Treatment: 26 Active Problems Location of Pain Severity and Description of Pain Patient Has Paino No Site Locations Pain Management and Medication Current Pain Management: Electronic Signature(s) Signed: 02/17/2021 5:14:32 PM By: Rhae Hammock RN Entered By: Rhae Hammock on 02/17/2021 12:53:02 -------------------------------------------------------------------------------- Patient/Caregiver Education Details Patient Name: Date of Service: [name] [name] 3/28/2022andnbsp12:30 PM Medical Record Number: [id number] Patient Account Number: [id number] Date of Birth/Gender: Treating RN: 13-Apr-1971 (50 y.o. [name] [name] Primary Care Physician: [name] Other Clinician: Referring Physician: Treating Physician/Extender: Aurora Mask in Treatment: 26 Education Assessment Education Provided To: Patient Education Topics Provided Wound/Skin Impairment: Methods: Explain/Verbal Responses: State content correctly [employer]) Signed: 02/17/2021 5:28:50 PM By: Levan Hurst RN, BSN Entered By: Levan Hurst on 02/17/2021 15:28:28 -------------------------------------------------------------------------------- Wound Assessment Details Patient Name: Date of Service: [name] [name], [name] 02/17/2021 12:30 PM Medical Record Number: [id number] Patient Account Number: [id number] Date of Birth/Sex: Treating RN: 1971/09/01 (50 y.o. [name] [name], [name] [name] Primary Care Raynaldo Falco: [name] Other Clinician: Referring Karey Suthers: Treating Montie Gelardi/Extender: Aurora Mask in Treatment: 26 Wound Status Wound Number: 2R Primary Diabetic Wound/Ulcer of the Lower Extremity Etiology: Wound Location: Left Metatarsal head first Wound Status: Open Wounding Event: Blister Comorbid  Anemia, Hypertension, Peripheral Venous Disease, Type II Date Acquired: 06/23/2020 History: Diabetes Weeks Of Treatment: 26 Clustered Wound:  No Photos Wound Measurements Length: (cm) 0.5 Width: (cm) 0.2 Depth: (cm) 0.5 Area: (cm) 0.079 Volume: (cm) 0.039 Wound Description Classification: Grade 2 Wound Margin: Thickened Exudate Amount: Medium Exudate Type: Serosanguineous Exudate Color: red, brown Foul Odor After Cleansing: Slough/Fibrino % Reduction in Area: -154.8% % Reduction in Volume: -1200% Epithelialization: None Tunneling: No Undermining: Yes Starting Position (o'clock): 12 Ending Position (o'clock): 12 Maximum Distance: (cm) 0.5 No No Wound Bed Granulation Amount: Large (67-100%) Exposed Structure Granulation Quality: Red Fascia Exposed: No Necrotic Amount: None Present (0%) Fat Layer (Subcutaneous Tissue) Exposed: Yes Tendon Exposed: No Muscle Exposed: No Joint Exposed: No Bone Exposed: No Treatment Notes Wound #2R (Metatarsal head first) Wound Laterality: Left Cleanser Soap and Water Discharge Instruction: May shower and wash wound with dial antibacterial soap and water prior to dressing change. Wound Cleanser Discharge Instruction: Cleanse the wound with wound cleanser prior to applying a clean dressing using gauze sponges, not tissue or cotton balls. Peri-Wound Care Sween Lotion (Moisturizing lotion) Discharge Instruction: Apply moisturizing lotion as directed Topical Primary Dressing Promogran Prisma Matrix, 4.34 (sq in) (silver collagen) Discharge Instruction: Moisten collagen with saline or hydrogel Secondary Dressing Woven Gauze Sponges 2x2 in Discharge Instruction: Apply over primary dressing as directed. Optifoam Non-Adhesive Dressing, 4x4 in Discharge Instruction: Apply over primary dressing cut to make foam donut to help offload Secured With Conforming Stretch Gauze Bandage, Sterile 2x75 (in/in) Discharge Instruction: Secure with  stretch gauze as directed. 66M Medipore H Soft Cloth Surgical T ape, 2x2 (in/yd) Discharge Instruction: Secure dressing with tape as directed. Compression Wrap Compression Stockings Add-Ons Electronic Signature(s) Signed: 02/17/2021 5:14:32 PM By: Rhae Hammock RN Signed: 02/19/2021 9:05:34 AM By: Sandre Kitty Entered By: Sandre Kitty on 02/17/2021 16:43:19 -------------------------------------------------------------------------------- Vitals Details Patient Name: Date of Service: [name] [name], [name] 02/17/2021 12:30 PM Medical Record Number: [id number] Patient Account Number: [id number] Date of Birth/Sex: Treating RN: December 19, 1970 (50 y.o. [name] [name], [name] [name] Primary Care Onica Davidovich: [name] Other Clinician: Referring Haroun Cotham: Treating Litsy Epting/Extender: Aurora Mask in Treatment: 26 Vital Signs Time Taken: 12:52 Temperature (F): 97.9 Height (in): 77 Pulse (bpm): 88 Weight (lbs): 232 Respiratory Rate (breaths/min): 17 Body Mass Index (BMI): 27.5 Blood Pressure (mmHg): 129/85 Capillary Blood Glucose (mg/dl): 122 Reference Range: 80 - 120 mg / dl Electronic Signature(s) Signed: 02/17/2021 5:14:32 PM By: Rhae Hammock RN Entered By: Rhae Hammock on 02/17/2021 12:52:55

## 2021-03-03 ENCOUNTER — Encounter (HOSPITAL_BASED_OUTPATIENT_CLINIC_OR_DEPARTMENT_OTHER): Payer: Medicare Other | Attending: Internal Medicine | Admitting: Internal Medicine

## 2021-03-03 ENCOUNTER — Other Ambulatory Visit: Payer: Self-pay

## 2021-03-03 DIAGNOSIS — E1142 Type 2 diabetes mellitus with diabetic polyneuropathy: Secondary | ICD-10-CM | POA: Diagnosis not present

## 2021-03-03 DIAGNOSIS — Z89511 Acquired absence of right leg below knee: Secondary | ICD-10-CM | POA: Diagnosis not present

## 2021-03-03 DIAGNOSIS — L97529 Non-pressure chronic ulcer of other part of left foot with unspecified severity: Secondary | ICD-10-CM | POA: Diagnosis present

## 2021-03-03 DIAGNOSIS — E11621 Type 2 diabetes mellitus with foot ulcer: Secondary | ICD-10-CM | POA: Diagnosis not present

## 2021-03-03 DIAGNOSIS — F17200 Nicotine dependence, unspecified, uncomplicated: Secondary | ICD-10-CM | POA: Insufficient documentation

## 2021-03-03 DIAGNOSIS — L97522 Non-pressure chronic ulcer of other part of left foot with fat layer exposed: Secondary | ICD-10-CM | POA: Diagnosis not present

## 2021-03-03 NOTE — Progress Notes (Signed)
OLYN, LANDSTROM ([id number]) Visit Report for 03/03/2021 Chief Complaint Document Details Patient Name: Date of Service: [name] [name] 03/03/2021 12:30 PM Medical Record Number: [id number] Patient Account Number: [id number] Date of Birth/Sex: Treating RN: 1971/05/05 (50 y.o. [name] [name] Primary Care Provider: [name] Other Clinician: Referring Provider: Treating Provider/Extender: Delano Metz in Treatment: 28 Information Obtained from: Patient Chief Complaint 04/21/18; patient is here for review of a wound on his plantar left first metatarsal head 08/16/2020; patient is here for a review of the wound on his plantar left first metatarsal head Electronic Signature(s) Signed: 03/03/2021 4:22:52 PM By: Kalman Shan DO Entered By: Kalman Shan on 03/03/2021 13:14:12 -------------------------------------------------------------------------------- Debridement Details Patient Name: Date of Service: [name] [name] [name], [name] 03/03/2021 12:30 PM Medical Record Number: [id number] Patient Account Number: [id number] Date of Birth/Sex: Treating RN: 1971/06/29 (50 y.o. [name] [name] Primary Care Provider: [name] Other Clinician: Referring Provider: Treating Provider/Extender: Delano Metz in Treatment: 28 Debridement Performed for Assessment: Wound #2R Left Metatarsal head first Performed By: Physician Kalman Shan, Debridement Type: Debridement Severity of Tissue Pre Debridement: Fat layer exposed Level of Consciousness (Pre-procedure): Awake and Alert Pre-procedure Verification/Time Out Yes - 12:52 Taken: Start Time: 12:52 T Area Debrided (L x W): otal 0.5 (cm) x 0.5 (cm) = 0.25 (cm) Tissue and other material debrided: Viable, Non-Viable, Callus, Subcutaneous Level: Skin/Subcutaneous Tissue Debridement Description: Excisional Instrument: Curette Bleeding: Minimum Hemostasis Achieved:  Pressure End Time: 12:54 Procedural Pain: 0 Post Procedural Pain: 0 Response to Treatment: Procedure was tolerated well Level of Consciousness (Post- Awake and Alert procedure): Post Debridement Measurements of Total Wound Length: (cm) 0.3 Width: (cm) 0.2 Depth: (cm) 0.3 Volume: (cm) 0.014 Character of Wound/Ulcer Post Debridement: Improved Severity of Tissue Post Debridement: Fat layer exposed Post Procedure Diagnosis Same as Pre-procedure Electronic Signature(s) Signed: 03/03/2021 5:30:45 PM By: Levan Hurst RN, BSN Entered By: Levan Hurst on 03/03/2021 12:56:08 -------------------------------------------------------------------------------- HPI Details Patient Name: Date of Service: [name] [name], [name] 03/03/2021 12:30 PM Medical Record Number: [id number] Patient Account Number: [id number] Date of Birth/Sex: Treating RN: Dec 01, 1970 (50 y.o. [name] [name] Primary Care Provider: [name] Other Clinician: Referring Provider: Treating Provider/Extender: Delano Metz in Treatment: 28 History of Present Illness HPI Description: 04/21/18 ADMISSION This is a 50 year old man who is a type II diabetic. In spite of fact his hemoglobin A1c is actually quite good 5.73 months ago he is at a really difficult time over the last 6-7 months. He developed a rapidly progressive infection in the right foot in November 2018 associated with osteomyelitis and necrotizing fasciitis. 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 The stump required and a revision on 11/24/17. The stump revision was advertised as being secondary to falls. I'm not sure the progressive history here however this area is actually closed over. The patient tells me over the same timeframe he has had wounds on the plantar aspect of his left foot. In the ED saw Dr. Sharol Given in April of this year. 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 It is really  not clear that this patient is been dressing this with anything. He came in with the clinic without any specific dressing on the wound areas using his own tennis shoe. He tells me that he only ambulates of course to do a pivot transfer. He does not have his prosthesis for the right leg as of  yet and he blames Medicaid for this. He does however use the foot to push himself along in his wheelchair at home. The patient has not had formal arterial studies. ABI in our clinic on the left was 1.13. Patient's past medical history includes type 2 diabetes, fracture of the right fibula. I see that he was treated for abscesses on his right buttock and chest in 2016. I did not look at the microbiology of this. 04/28/18; patient comes back in the clinic today with the wound pretty much the same as when he came in here last week. Small opening lots of undermining relatively. He tells [place] that nothing is really been on this for 3 days in spite of the fact that we gave him enough to dress this easily especially such a small wound. He says he lives with his mother, she is not capable of assisting with this he is changing the dressings himself. 05/05/18; much better-looking wound today. Smaller. There is some undermining medially however that's only perhaps 2 mm. No surrounding erythema. We've been using silver collagen 05/12/18; small wound on the first metatarsal head. No undermining no surrounding erythema. We've been using silver collagen he has home health coming out to change 05/20/18 the wound on the first metatarsal head looks better. Covered in surface debris/callus nonviable tissue. Required debridement but post debridement this looks quite good. We've been using silver collagen. He has home health 05/27/18; first metatarsal head wound continues to improve. Just about completely closed. Still a lot of surface debridement callus. We've been using silver collagen 06/06/18; the first metatarsal wound is completely  healed over. There is still a lot of callus. I gently removed some of this just to make sure that there was no open area and there is not. The patient mentions to me that his left leg has felt like "lead" for about a week READMISSION 08/16/2020 This is a 50 year old man with type 2 diabetes. He returns to clinic today with a 1 month history of a blister and callus over the first metatarsal head. This is in exactly the same area as when he was here in 2019. 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 He has standard running shoes on the left foot to match the area in his prosthesis. He has only been covering this area with a Band-Aid has not really been specifically dressing this. ABI in our clinic on the left was 1.16. This is essentially stable from the value in 2019 10/1 the patient has a linear wound over the left first metatarsal head. Again not a lot of callus thick skin around this that I removed with a #3 curette. This is not go deep to bone or muscle it does not look to be infected. 10/8 first metatarsal head. The wound looks like it is closing however this is going to be a difficult area to offload in the future. He has a size 15 shoe and he says his shoes are years old. The inserts in these current shoes are totally useless and I have advised him to replace these. He may need a new pair of shoes and I have he got original prosthesis at [employer] [place] I have asked him to go back there. 11/9; the patient has not been here in more than a month. Not sure he was healed when he was here the last time. I told him he would have to get new shoes  and certainly offload the area over the left first metatarsal head. He has done nothing of the kind. He arrives back in clinic with the same old new balance sneakers. A very large separating callus over the first metatarsal head on the left 11/16; he has purchased new shoes and has at least 2 insoles like I  asked. The wound is measuring much smaller. He is using silver alginate he changes the dressing himself 11/30; wound is measurably smaller in length of about a half a centimeter. This is generally very little depth. We have been using silver alginate. He changes the dressing himself every second day. 12/14; wound is about the same size. Significant undermining medially relative to the size of the wound. We have been using silver alginate. There is not a way to offload this area as he walks with a prosthesis on the right leg 12/28; wound is about the same size. Again he has undermining medially. I am using polymen on this 12/02/2020; the wound looks smaller. Still a senescent edge. We have been using polymen 1/24; the wound is come down a few millimeters. Still a senescent edge. I have been using polymen 2/8; the wound is come down slightly. Still with slight undermining from 12-6 o'clock I have been using polymen partially to get offloading on this area which is opposed by his prosthesis on the other leg. 2/22 quite open improvement he still has a comma shaped wound on the left first metatarsal head. Some depth. Using polymen 3/14; he still has the same problem of a comma shaped area over the left first metatarsal head. This seems to have had more depth today at 0.6 cm. We have been using polymen. The patient changes this himself every second day. I explored the idea of a total contact cast on the left leg however this is his driving foot. He was not enamored with the idea of not being able to drive and states that he does not have anybody else to get him around 3/28; again he comes in with a smaller looking wound however there is depth and undermining requiring debridement. We have been using Hydrofera Blue, changed to silver collagen today. The patient is doing the dressing himself 4/11; patient presents today for follow-up of his left diabetic foot wound. He denies any issues in the past 2 weeks.  He is currently using silver collagen every other day with dressing changes. He does not use diabetic shoes or special inserts to his sneakers. He does not use felt pads for offloading. Electronic Signature(s) Signed: 03/03/2021 4:22:52 PM By: Kalman Shan DO Entered By: Kalman Shan on 03/03/2021 13:16:00 -------------------------------------------------------------------------------- Physical Exam Details Patient Name: Date of Service: [name] [name], [name] 03/03/2021 12:30 PM Medical Record Number: [id number] Patient Account Number: [id number] Date of Birth/Sex: Treating RN: Jan 29, 1971 (50 y.o. [name] [name] Primary Care Provider: [name] Other Clinician: Referring Provider: Treating Provider/Extender: Delano Metz in Treatment: 28 Constitutional respirations regular, non-labored and within target range for patient.. Notes Left foot: Left plantar first metatarsal with severe callus surrounding the wound. Healthy granulation tissue noted to the depth of the wound. Overall no signs of infection. Foot warm to the touch, pulses intact Electronic Signature(s) Signed: 03/03/2021 4:22:52 PM By: Kalman Shan DO Entered By: Kalman Shan on 03/03/2021 13:17:00 -------------------------------------------------------------------------------- Physician Orders Details Patient Name: Date of Service: [name] [name], [name] 03/03/2021 12:30 PM Medical Record Number: [id number] Patient Account Number: [id number] Date of Birth/Sex: Treating RN: December 28, 1970 (50  y.o. 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 - with Dr. Heber [place] Return A Bathing/ Shower/ Hygiene May shower and wash wound with soap and water. - WITH DRESSING CHANGES. Edema Control - Lymphedema / SCD / Other Elevate legs to the level of the heart or above for 30 minutes daily and/or when sitting, a frequency of: Avoid standing for long periods of time. Off-Loading Other: - use felt for offloading Wound Treatment Wound #2R - Metatarsal head first Wound Laterality: Left Cleanser: Soap and Water Every Other Day/7 Days Discharge Instructions: May shower and wash wound with dial antibacterial soap and water prior to dressing change. Cleanser: Wound Cleanser (Generic) Every Other Day/7 Days Discharge Instructions: Cleanse the wound with wound cleanser prior to applying a clean dressing using gauze sponges, not tissue or cotton balls. Peri-Wound Care: Sween Lotion (Moisturizing lotion) Every Other Day/7 Days Discharge Instructions: Apply moisturizing lotion as directed Prim Dressing: Promogran Prisma Matrix, 4.34 (sq in) (silver collagen) Every Other Day/7 Days ary Discharge Instructions: Moisten collagen with saline or hydrogel Secondary Dressing: Woven Gauze Sponges 2x2 in (Generic) Every Other Day/7 Days Discharge Instructions: Apply over primary dressing as directed. Secondary Dressing: Optifoam Non-Adhesive Dressing, 4x4 in (Generic) Every Other Day/7 Days Discharge Instructions: Apply over primary dressing cut to make foam donut to help offload Secured With: Conforming Stretch Gauze Bandage, Sterile 2x75 (in/in) (Generic) Every Other Day/7 Days Discharge Instructions: Secure with stretch gauze as directed. Secured With: 27M Medipore H Soft Cloth Surgical Tape, 2x2 (in/yd) (Generic) Every Other Day/7 Days Discharge Instructions: Secure dressing with tape as  directed. Electronic Signature(s) Signed: 03/03/2021 4:22:52 PM By: Kalman Shan DO Entered By: Kalman Shan on 03/03/2021 13:17:31 -------------------------------------------------------------------------------- Problem List Details Patient Name: Date of Service: [name] [name] [name], [name] 03/03/2021 12:30 PM Medical Record Number: [id number] Patient Account Number: [id number] Date of Birth/Sex: Treating RN: [birth year] (50 y.o. [name] [name] Primary Care Provider: [name] Other Clinician: Referring Provider: Treating Provider/Extender: Delano Metz in Treatment: 28 Active Problems ICD-10 Encounter Code Description Active Date MDM Diagnosis E11.621 Type 2 diabetes mellitus with foot ulcer 08/16/2020 No Yes L97.522 Non-pressure chronic ulcer of other part of left foot with fat layer exposed 08/16/2020 No Yes E11.42 Type 2 diabetes mellitus with diabetic polyneuropathy 08/16/2020 No Yes Z89.511 Acquired absence of right leg below knee 08/16/2020 No Yes Inactive Problems Resolved Problems Electronic Signature(s) Signed: 03/03/2021 4:22:52 PM By: Kalman Shan DO Entered By: Kalman Shan on 03/03/2021 13:13:50 -------------------------------------------------------------------------------- Progress Note Details Patient Name: Date of Service: [name] [name], [name] 03/03/2021 12:30 PM Medical Record Number: [id number] Patient Account Number: [id number] Date of Birth/Sex: Treating RN: 1971/01/02 (50 y.o. [name] [name] Primary Care Provider: [name] Other Clinician: Referring Provider: Treating Provider/Extender: Delano Metz in Treatment: 28 Subjective Chief Complaint Information obtained from Patient 04/21/18; patient is here for review of a wound on his plantar left first metatarsal head 08/16/2020; patient is here for a review of the wound on his plantar left first metatarsal head History of  Present Illness (HPI) 04/21/18 ADMISSION This is  a 50 year old man who is a type II diabetic. In spite of fact his hemoglobin A1c is actually quite good 5.73 months ago he is at a really difficult time over the last 6-7 months. He developed a rapidly progressive infection in the right foot in November 2018 associated with osteomyelitis and necrotizing fasciitis. 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 The stump required and a revision on 11/24/17. The stump revision was advertised as being secondary to falls. I'm not sure the progressive history here however this area is actually closed over. The patient tells me over the same timeframe he has had wounds on the plantar aspect of his left foot. In the ED saw Dr. Sharol Given in April of this year. 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 It is really not clear that this patient is been dressing this with anything. He came in with the clinic without any specific dressing on the wound areas using his own tennis shoe. He tells me that he only ambulates of course to do a pivot transfer. He does not have his prosthesis for the right leg as of yet and he blames Medicaid for this. He does however use the foot to push himself along in his wheelchair at home. The patient has not had formal arterial studies. ABI in our clinic on the left was 1.13. Patient's past medical history includes type 2 diabetes, fracture of the right fibula. I see that he was treated for abscesses on his right buttock and chest in 2016. I did not look at the microbiology of this. 04/28/18; patient comes back in the clinic today with the wound pretty much the same as when he came in here last week. Small opening lots of undermining relatively. He tells [place] that nothing is really been on this for 3 days in spite of the fact that we gave him enough to dress this easily especially such a small wound. He says he lives with his mother, she is not capable of  assisting with this he is changing the dressings himself. 05/05/18; much better-looking wound today. Smaller. There is some undermining medially however that's only perhaps 2 mm. No surrounding erythema. We've been using silver collagen 05/12/18; small wound on the first metatarsal head. No undermining no surrounding erythema. We've been using silver collagen he has home health coming out to change 05/20/18 the wound on the first metatarsal head looks better. Covered in surface debris/callus nonviable tissue. Required debridement but post debridement this looks quite good. We've been using silver collagen. He has home health 05/27/18; first metatarsal head wound continues to improve. Just about completely closed. Still a lot of surface debridement callus. We've been using silver collagen 06/06/18; the first metatarsal wound is completely healed over. There is still a lot of callus. I gently removed some of this just to make sure that there was no open area and there is not. The patient mentions to me that his left leg has felt like "lead" for about a week READMISSION 08/16/2020 This is a 50 year old man with type 2 diabetes. He returns to clinic today with a 1 month history of a blister and callus over the first metatarsal head. This is in exactly the same area as when he was here in 2019. 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 He has standard running shoes on the left foot to match the area in his  prosthesis. He has only been covering this area with a Band-Aid has not really been specifically dressing this. ABI in our clinic on the left was 1.16. This is essentially stable from the value in 2019 10/1 the patient has a linear wound over the left first metatarsal head. Again not a lot of callus thick skin around this that I removed with a #3 curette. This is not go deep to bone or muscle it does not look to be infected. 10/8 first metatarsal head. The  wound looks like it is closing however this is going to be a difficult area to offload in the future. He has a size 15 shoe and he says his shoes are years old. The inserts in these current shoes are totally useless and I have advised him to replace these. He may need a new pair of shoes and I have he got original prosthesis at [employer] [place] I have asked him to go back there. 11/9; the patient has not been here in more than a month. Not sure he was healed when he was here the last time. I told him he would have to get new shoes and certainly offload the area over the left first metatarsal head. He has done nothing of the kind. He arrives back in clinic with the same old new balance sneakers. A very large separating callus over the first metatarsal head on the left 11/16; he has purchased new shoes and has at least 2 insoles like I asked. The wound is measuring much smaller. He is using silver alginate he changes the dressing himself 11/30; wound is measurably smaller in length of about a half a centimeter. This is generally very little depth. We have been using silver alginate. He changes the dressing himself every second day. 12/14; wound is about the same size. Significant undermining medially relative to the size of the wound. We have been using silver alginate. There is not a way to offload this area as he walks with a prosthesis on the right leg 12/28; wound is about the same size. Again he has undermining medially. I am using polymen on this 12/02/2020; the wound looks smaller. Still a senescent edge. We have been using polymen 1/24; the wound is come down a few millimeters. Still a senescent edge. I have been using polymen 2/8; the wound is come down slightly. Still with slight undermining from 12-6 o'clock I have been using polymen partially to get offloading on this area which is opposed by his prosthesis on the other leg. 2/22 quite open improvement he still has a comma shaped wound  on the left first metatarsal head. Some depth. Using polymen 3/14; he still has the same problem of a comma shaped area over the left first metatarsal head. This seems to have had more depth today at 0.6 cm. We have been using polymen. The patient changes this himself every second day. I explored the idea of a total contact cast on the left leg however this is his driving foot. He was not enamored with the idea of not being able to drive and states that he does not have anybody else to get him around 3/28; again he comes in with a smaller looking wound however there is depth and undermining requiring debridement. We have been using Hydrofera Blue, changed to silver collagen today. The patient is doing the dressing himself 4/11; patient presents today for follow-up of his left diabetic foot wound. He denies any issues in the past  2 weeks. He is currently using silver collagen every other day with dressing changes. He does not use diabetic shoes or special inserts to his sneakers. He does not use felt pads for offloading. Patient History Information obtained from Patient. Family History Diabetes - Maternal Grandparents,Mother, Heart Disease - Father, Hypertension - Father, No family history of Cancer, Hereditary Spherocytosis, Kidney Disease, Lung Disease, Seizures, Stroke, Thyroid Problems, Tuberculosis. Social History Current every day smoker, Marital Status - Single, Alcohol Use - Never, Drug Use - No History, Caffeine Use - Never. Medical History Eyes Denies history of Cataracts, Glaucoma, Optic Neuritis Ear/Nose/Mouth/Throat Denies history of Chronic sinus problems/congestion, Middle ear problems Hematologic/Lymphatic Patient has history of Anemia Denies history of Hemophilia, Human Immunodeficiency Virus, Lymphedema, Sickle Cell Disease Respiratory Denies history of Aspiration, Asthma, Chronic Obstructive Pulmonary Disease (COPD), Pneumothorax, Sleep Apnea,  Tuberculosis Cardiovascular Patient has history of Hypertension, Peripheral Venous Disease Denies history of Angina, Arrhythmia, Congestive Heart Failure, Coronary Artery Disease, Deep Vein Thrombosis, Hypotension, Myocardial Infarction, Peripheral Arterial Disease, Phlebitis, Vasculitis Gastrointestinal Denies history of Cirrhosis , Colitis, Crohnoos, Hepatitis A, Hepatitis B, Hepatitis C Endocrine Patient has history of Type II Diabetes - oral meds Denies history of Type I Diabetes Genitourinary Denies history of End Stage Renal Disease Immunological Denies history of Lupus Erythematosus, Raynaudoos, Scleroderma Integumentary (Skin) Denies history of History of Burn Musculoskeletal Denies history of Gout, Rheumatoid Arthritis, Osteoarthritis, Osteomyelitis Neurologic Denies history of Dementia, Neuropathy, Quadriplegia, Paraplegia, Seizure Disorder Oncologic Denies history of Received Chemotherapy, Received Radiation Psychiatric Denies history of Anorexia/bulimia, Confinement Anxiety Medical A Surgical History Notes [name] Constitutional Symptoms (General Health) falls , leukocytosis Respiratory During waking hours and catches himself not breathing, "gasp for air". Saw Dr Wynonia Lawman, he couldn't find a reason Objective Constitutional respirations regular, non-labored and within target range for patient.. Vitals Time Taken: 12:44 PM, Height: 77 in, Weight: 232 lbs, BMI: 27.5, Temperature: 98.4 F, Pulse: 85 bpm, Respiratory Rate: 16 breaths/min, Blood Pressure: 128/84 mmHg, Capillary Blood Glucose: 70 mg/dl. General Notes: glucose per pt report General Notes: Left foot: Left plantar first metatarsal with severe callus surrounding the wound. Healthy granulation tissue noted to the depth of the wound. Overall no signs of infection. Foot warm to the touch, pulses intact Integumentary (Hair, Skin) Wound #2R status is Open. Original cause of wound was Blister. The date acquired was:  06/23/2020. The wound has been in treatment 28 weeks. The wound is located on the Left Metatarsal head first. The wound measures 0.3cm length x 0.2cm width x 0.3cm depth; 0.047cm^2 area and 0.014cm^3 volume. There is Fat Layer (Subcutaneous Tissue) exposed. There is no tunneling noted, however, there is undermining starting at 12:00 and ending at 12:00 with a maximum distance of 0.2cm. There is a medium amount of serosanguineous drainage noted. The wound margin is thickened. There is large (67-100%) pink granulation within the wound bed. There is no necrotic tissue within the wound bed. 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 He had very difficult to remove callus to the left side. Debridement showed good granulation tissue present. We discussed the importance of offloading and offered him a felt pad today in office to continue using. He is currently using silver collagen with improvement. Procedures Wound #2R Pre-procedure diagnosis of Wound #2R  is a Diabetic Wound/Ulcer of the Lower Extremity located on the Left Metatarsal head first .Severity of Tissue Pre Debridement is: Fat layer exposed. There was a Excisional Skin/Subcutaneous Tissue Debridement with a total area of 0.25 sq cm performed by Kalman Shan. With the following instrument(s): Curette to remove Viable and Non-Viable tissue/material. Material removed includes Callus and Subcutaneous Tissue and. No specimens were taken. A time out was conducted at 12:52, prior to the start of the procedure. A Minimum amount of bleeding was controlled with Pressure. The procedure was tolerated well with a pain level of 0 throughout and a pain level of 0 following  the procedure. Post Debridement Measurements: 0.3cm length x 0.2cm width x 0.3cm depth; 0.014cm^3 volume. Character of Wound/Ulcer Post Debridement is improved. Severity of Tissue Post Debridement is: Fat layer exposed. Post procedure Diagnosis Wound #2R: Same as Pre-Procedure Plan Follow-up Appointments: Return Appointment in 1 week. - with Dr. Heber [place] Bathing/ Shower/ Hygiene: May shower and wash wound with soap and water. - WITH DRESSING CHANGES. Edema Control - Lymphedema / SCD / Other: Elevate legs to the level of the heart or above for 30 minutes daily and/or when sitting, a frequency of: Avoid standing for long periods of time. Off-Loading: Other: - use felt for offloading WOUND #2R: - Metatarsal head first Wound Laterality: Left Cleanser: Soap and Water Every Other Day/7 Days Discharge Instructions: May shower and wash wound with dial antibacterial soap and water prior to dressing change. Cleanser: Wound Cleanser (Generic) Every Other Day/7 Days Discharge Instructions: Cleanse the wound with wound cleanser prior to applying a clean dressing using gauze sponges, not tissue or cotton balls. Peri-Wound Care: Sween Lotion (Moisturizing lotion) Every Other Day/7 Days Discharge Instructions: Apply moisturizing lotion as directed Prim Dressing: Promogran Prisma Matrix, 4.34 (sq in) (silver collagen) Every Other Day/7 Days ary Discharge Instructions: Moisten collagen with saline or hydrogel Secondary Dressing: Woven Gauze Sponges 2x2 in (Generic) Every Other Day/7 Days Discharge Instructions: Apply over primary dressing as directed. Secondary Dressing: Optifoam Non-Adhesive Dressing, 4x4 in (Generic) Every Other Day/7 Days Discharge Instructions: Apply over primary dressing cut to make foam donut to help offload Secured With: Conforming Stretch Gauze Bandage, Sterile 2x75 (in/in) (Generic) Every Other Day/7 Days Discharge Instructions: Secure with stretch gauze as directed. Secured  With: 7M Medipore H Soft Cloth Surgical T ape, 2x2 (in/yd) (Generic) Every Other Day/7 Days Discharge Instructions: Secure dressing with tape as directed. 1. In office sharp debridement with 3 mm curette. 2. Follow-up in 1 week 3. Continue silver collagen every other day with dressing change 4. Felt offload padding to be used daily Electronic Signature(s) Signed: 03/03/2021 4:22:52 PM By: Kalman Shan DO Entered By: Kalman Shan on 03/03/2021 13:24:00 -------------------------------------------------------------------------------- HxROS Details Patient Name: Date of Service: [name] [name], [name] 03/03/2021 12:30 PM Medical Record Number: [id number] Patient Account Number: [id number] Date of Birth/Sex: Treating RN: 05-Sep-1971 (50 y.o. [name] [name] Primary Care Provider: [name] Other Clinician: Referring Provider: Treating Provider/Extender: Delano Metz in Treatment: 28 Information Obtained From Patient Constitutional Symptoms (General Health) Medical History: Past Medical History Notes: falls , leukocytosis Eyes Medical History: Negative for: Cataracts; Glaucoma; Optic Neuritis Ear/Nose/Mouth/Throat Medical History: Negative for: Chronic sinus problems/congestion; Middle ear problems Hematologic/Lymphatic Medical History: Positive for: Anemia Negative for: Hemophilia; Human Immunodeficiency Virus; Lymphedema; Sickle Cell Disease Respiratory Medical History: Negative for: Aspiration; Asthma; Chronic Obstructive Pulmonary Disease (COPD); Pneumothorax; Sleep Apnea; Tuberculosis Past Medical History Notes: During waking hours and catches himself not breathing, "  gasp for air". Saw Dr Wynonia Lawman, he couldn't find a reason Cardiovascular Medical History: Positive for: Hypertension; Peripheral Venous Disease Negative for: Angina; Arrhythmia; Congestive Heart Failure; Coronary Artery Disease; Deep Vein Thrombosis; Hypotension;  Myocardial Infarction; Peripheral Arterial Disease; Phlebitis; Vasculitis Gastrointestinal Medical History: Negative for: Cirrhosis ; Colitis; Crohns; Hepatitis A; Hepatitis B; Hepatitis C Endocrine Medical History: Positive for: Type II Diabetes - oral meds Negative for: Type I Diabetes Time with diabetes: 2014 Treated with: Oral agents Blood sugar tested every day: Yes Tested : Blood sugar testing results: Breakfast: 91 Genitourinary Medical History: Negative for: End Stage Renal Disease Immunological Medical History: Negative for: Lupus Erythematosus; Raynauds; Scleroderma Integumentary (Skin) Medical History: Negative for: History of Burn Musculoskeletal Medical History: Negative for: Gout; Rheumatoid Arthritis; Osteoarthritis; Osteomyelitis Neurologic Medical History: Negative for: Dementia; Neuropathy; Quadriplegia; Paraplegia; Seizure Disorder Oncologic Medical History: Negative for: Received Chemotherapy; Received Radiation Psychiatric Medical History: Negative for: Anorexia/bulimia; Confinement Anxiety Immunizations Pneumococcal Vaccine: Received Pneumococcal Vaccination: No Implantable Devices No devices added Family and Social History Cancer: No; Diabetes: Yes - Maternal Grandparents,Mother; Heart Disease: Yes - Father; Hereditary Spherocytosis: No; Hypertension: Yes - Father; Kidney Disease: No; Lung Disease: No; Seizures: No; Stroke: No; Thyroid Problems: No; Tuberculosis: No; Current every day smoker; Marital Status - Single; Alcohol Use: Never; Drug Use: No History; Caffeine Use: Never; Financial Concerns: No; Food, Clothing or Shelter Needs: No; Support System Lacking: No; Transportation Concerns: No Electronic Signature(s) Signed: 03/03/2021 4:22:52 PM By: Kalman Shan DO Signed: 03/03/2021 5:30:45 PM By: Levan Hurst RN, BSN Entered By: Kalman Shan on 03/03/2021  13:16:13 -------------------------------------------------------------------------------- [place] Details Patient Name: Date of Service: [name] [name], [name] 03/03/2021 Medical Record Number: [id number] Patient Account Number: [id number] Date of Birth/Sex: Treating RN: 1971/06/24 (50 y.o. [name] [name] Primary Care Provider: [name] Other Clinician: Referring Provider: Treating Provider/Extender: Delano Metz in Treatment: 28 Diagnosis Coding ICD-10 Codes Code Description E11.621 Type 2 diabetes mellitus with foot ulcer L97.522 Non-pressure chronic ulcer of other part of left foot with fat layer exposed E11.42 Type 2 diabetes mellitus with diabetic polyneuropathy Z89.511 Acquired absence of right leg below knee Electronic Signature(s) Signed: 03/03/2021 4:22:52 PM By: Kalman Shan DO Entered By: Kalman Shan on 03/03/2021 13:25:50

## 2021-03-05 NOTE — Progress Notes (Signed)
[name], [name] ([id number]) Visit Report for 03/03/2021 Arrival Information Details Patient Name: Date of Service: [name] [name] 03/03/2021 12:30 PM Medical Record Number: [id number] Patient Account Number: [id number] Date of Birth/Sex: Treating RN: Apr 13, 1971 (50 y.o. [name] [name] Primary Care Kripa Foskey: [name] Other Clinician: Referring Heidemarie Goodnow: Treating Charnel Giles/Extender: Delano Metz in Treatment: 16 Visit Information History Since Last Visit Added or deleted any medications: No Patient Arrived: Ambulatory Any new allergies or adverse reactions: No Arrival Time: 12:44 Had a fall or experienced change in No Accompanied By: alone activities of daily living that may affect Transfer Assistance: None risk of falls: Patient Identification Verified: Yes Signs or symptoms of abuse/neglect since last visito No Secondary Verification Process Completed: Yes Hospitalized since last visit: No Patient Requires Transmission-Based Precautions: No Implantable device outside of the clinic excluding No Patient Has Alerts: No cellular tissue based products placed in the center since last visit: Has Dressing in Place as Prescribed: Yes Pain Present Now: No Electronic Signature(s) Signed: 03/03/2021 5:30:45 PM By: Levan Hurst RN, BSN Entered By: Levan Hurst on 03/03/2021 12:44:17 -------------------------------------------------------------------------------- Lower Extremity Assessment Details Patient Name: Date of Service: [name] [name] ND, [name] 03/03/2021 12:30 PM Medical Record Number: [id number] Patient Account Number: [id number] Date of Birth/Sex: Treating RN: [birth year] (50 y.o. [name] [name] Primary Care Myrl Lazarus: [name] Other Clinician: Referring Musab Wingard: Treating Nesa Distel/Extender: Delano Metz in Treatment: 28 Edema Assessment Assessed: [name]: No] [name]: No] Edema: [Left: N] [Right:  o] Calf Left: Right: Point of Measurement: 50 cm From Medial Instep 40 cm Ankle Left: Right: Point of Measurement: 11 cm From Medial Instep 26 cm Vascular Assessment Pulses: Dorsalis Pedis Palpable: [Left:Yes] Electronic Signature(s) Signed: 03/03/2021 5:30:45 PM By: Levan Hurst RN, BSN Entered By: Levan Hurst on 03/03/2021 12:44:50 -------------------------------------------------------------------------------- Multi Wound Chart Details Patient Name: Date of Service: [name] [name] ND, [name] 03/03/2021 12:30 PM Medical Record Number: [id number] Patient Account Number: [id number] Date of Birth/Sex: Treating RN: Dec 08, 1970 (50 y.o. [name] [name] Primary Care [name] [name]: [name] Other Clinician: Referring Kateryna Grantham: Treating Ryott Rafferty/Extender: Delano Metz in Treatment: 28 Vital Signs Height(in): 69 Capillary Blood Glucose(mg/dl): 70 Weight(lbs): 232 Pulse(bpm): 84 Body Mass Index(BMI): 28 Blood Pressure(mmHg): 128/84 Temperature(F): 98.4 Respiratory Rate(breaths/min): 16 Photos: [2R:No Photos Left Metatarsal head first] [N/A:N/A N/A] Wound Location: [2R:Blister] [N/A:N/A] Wounding Event: [2R:Diabetic Wound/Ulcer of the Lower] [N/A:N/A] Primary Etiology: [2R:Extremity Anemia, Hypertension, Peripheral] [N/A:N/A] Comorbid History: [2R:Venous Disease, Type II Diabetes 06/23/2020] [N/A:N/A] Date Acquired: [2R:28] [N/A:N/A] Weeks of Treatment: [2R:Open] [N/A:N/A] Wound Status: [2R:Yes] [N/A:N/A] Wound Recurrence: [2R:0.3x0.2x0.3] [N/A:N/A] Measurements L x W x D (cm) [2R:0.047] [N/A:N/A] A (cm) : rea [2R:0.014] [N/A:N/A] Volume (cm) : [2R:-51.60%] [N/A:N/A] % Reduction in A [2R:rea: -366.70%] [N/A:N/A] % Reduction in Volume: [2R:12] Starting Position 1 (o'clock): [2R:12] Ending Position 1 (o'clock): [2R:0.2] Maximum Distance 1 (cm): [2R:Yes] [N/A:N/A] Undermining: [2R:Grade 2] [N/A:N/A] Classification: [2R:Medium]  [N/A:N/A] Exudate A mount: [2R:Serosanguineous] [N/A:N/A] Exudate Type: [2R:red, brown] [N/A:N/A] Exudate Color: [2R:Thickened] [N/A:N/A] Wound Margin: [2R:Large (67-100%)] [N/A:N/A] Granulation A mount: [2R:Pink] [N/A:N/A] Granulation Quality: [2R:None Present (0%)] [N/A:N/A] Necrotic A mount: [2R:Fat Layer (Subcutaneous Tissue): Yes N/A] Exposed Structures: [2R:Fascia: No Tendon: No Muscle: No Joint: No Bone: No Small (1-33%)] [N/A:N/A] Epithelialization: [2R:Debridement - Excisional] [N/A:N/A] Debridement: Pre-procedure Verification/Time Out 12:52 [N/A:N/A] Taken: [2R:Callus, Subcutaneous] [N/A:N/A] Tissue Debrided: [2R:Skin/Subcutaneous Tissue] [N/A:N/A] Level: [2R:0.25] [N/A:N/A] Debridement A (sq cm): [2R:rea Curette] [N/A:N/A] Instrument: [2R:Minimum] [N/A:N/A] Bleeding: [2R:Pressure] [N/A:N/A] Hemostasis A chieved: [2R:0] [N/A:N/A] Procedural Pain: [2R:0] [N/A:N/A]  Post Procedural Pain: [2R:Procedure was tolerated well] [N/A:N/A] Debridement Treatment Response: [2R:0.3x0.2x0.3] [N/A:N/A] Post Debridement Measurements L x W x D (cm) [2R:0.014] [N/A:N/A] Post Debridement Volume: (cm) [2R:Debridement] [N/A:N/A] Treatment Notes Electronic Signature(s) Signed: 03/03/2021 4:22:52 PM By: Kalman Shan DO Signed: 03/03/2021 5:30:45 PM By: Levan Hurst RN, BSN Entered By: Kalman Shan on 03/03/2021 13:13:58 -------------------------------------------------------------------------------- Multi-Disciplinary Care Plan Details Patient Name: Date of Service: [name] [name] ND, [name] 03/03/2021 12:30 PM Medical Record Number: [id number] Patient Account Number: [id number] Date of Birth/Sex: Treating RN: 05/30/1971 (50 y.o. [name] [name] Primary Care Mckenzi Buonomo: [name] Other Clinician: Referring Darolyn Double: Treating Kimani Bedoya/Extender: Delano Metz in Treatment: [place] reviewed with physician Active  Inactive Wound/Skin Impairment Nursing Diagnoses: Impaired tissue integrity Goals: Patient/caregiver will verbalize understanding of skin care regimen Date Initiated: 11/19/2020 Target Resolution Date: 04/11/2021 Goal Status: Active Ulcer/skin breakdown will have a volume reduction of 30% by week 4 Date Initiated: 08/16/2020 Date Inactivated: 03/03/2021 Target Resolution Date: 03/10/2021 Goal Status: Met Interventions: Provide education on ulcer and skin care Notes: Electronic Signature(s) Signed: 03/03/2021 5:30:45 PM By: Levan Hurst RN, BSN Entered By: Levan Hurst on 03/03/2021 12:52:54 -------------------------------------------------------------------------------- Pain Assessment Details Patient Name: Date of Service: [name] [name] ND, [name] 03/03/2021 12:30 PM Medical Record Number: [id number] Patient Account Number: [id number] Date of Birth/Sex: Treating RN: 04/08/1971 (50 y.o. [name] [name] Primary Care Jasdeep Kepner: [name] Other Clinician: Referring Zeola Brys: Treating Felipe Cabell/Extender: Delano Metz in Treatment: 28 Active Problems Location of Pain Severity and Description of Pain Patient Has Paino No Site Locations Pain Management and Medication Current Pain Management: Electronic Signature(s) Signed: 03/03/2021 5:30:45 PM By: Levan Hurst RN, BSN Entered By: Levan Hurst on 03/03/2021 12:44:45 -------------------------------------------------------------------------------- Patient/Caregiver Education Details Patient Name: Date of Service: [name] [name] 4/11/2022andnbsp12:30 PM Medical Record Number: [id number] Patient Account Number: [id number] Date of Birth/Gender: Treating RN: November 11, 1971 (50 y.o. [name] [name] Primary Care Physician: [name] Other Clinician: Referring Physician: Treating Physician/Extender: Delano Metz in Treatment: 28 Education Assessment Education  Provided To: Patient Education Topics Provided Wound/Skin Impairment: Methods: Explain/Verbal Responses: State content correctly [employer]) Signed: 03/03/2021 5:30:45 PM By: Levan Hurst RN, BSN Entered By: Levan Hurst on 03/03/2021 12:53:08 -------------------------------------------------------------------------------- Wound Assessment Details Patient Name: Date of Service: [name] [name] ND, [name] 03/03/2021 12:30 PM Medical Record Number: [id number] Patient Account Number: [id number] Date of Birth/Sex: Treating RN: [birth year] (50 y.o. [name] [name] Primary Care Zaelynn Fuchs: Other Clinician: [name] Referring Madeline Pho: Treating Kaitlyn Franko/Extender: Delano Metz in Treatment: 28 Wound Status Wound Number: 2R Primary Diabetic Wound/Ulcer of the Lower Extremity Etiology: Wound Location: Left Metatarsal head first Wound Status: Open Wounding Event: Blister Comorbid Anemia, Hypertension, Peripheral Venous Disease, Type II Date Acquired: 06/23/2020 History: Diabetes Weeks Of Treatment: 28 Clustered Wound: No Photos Wound Measurements Length: (cm) 0.3 Width: (cm) 0.2 Depth: (cm) 0.3 Area: (cm) 0.047 Volume: (cm) 0.014 % Reduction in Area: -51.6% % Reduction in Volume: -366.7% Epithelialization: Small (1-33%) Tunneling: No Undermining: Yes Starting Position (o'clock): 12 Ending Position (o'clock): 12 Maximum Distance: (cm) 0.2 Wound Description Classification: Grade 2 Wound Margin: Thickened Exudate Amount: Medium Exudate Type: Serosanguineous Exudate Color: red, brown Foul Odor After Cleansing: No Slough/Fibrino No Wound Bed Granulation Amount: Large (67-100%) Exposed Structure Granulation Quality: Pink Fascia Exposed: No Necrotic Amount: None Present (0%) Fat Layer (Subcutaneous Tissue) Exposed: Yes Tendon Exposed: No Muscle Exposed: No Joint Exposed: No Bone Exposed: No Electronic Signature(s) Signed:  03/03/2021 5:30:45 PM By: Levan Hurst RN, BSN Signed: 03/05/2021 8:29:36 AM By: Sandre Kitty Entered By: Sandre Kitty on 03/03/2021 16:43:17 -------------------------------------------------------------------------------- [place] Details Patient Name: Date of Service: [name], [name] 03/03/2021 12:30 PM Medical Record Number: [id number] Patient Account Number: [id number] Date of Birth/Sex: Treating RN: 08/18/1971 (50 y.o. [name] [name] Primary Care Tanganika Barradas: [name] Other Clinician: Referring Jentri Aye: Treating Yechezkel Fertig/Extender: Delano Metz in Treatment: 28 Vital Signs Time Taken: 12:44 Temperature (F): 98.4 Height (in): 77 Pulse (bpm): 85 Weight (lbs): 232 Respiratory Rate (breaths/min): 16 Body Mass Index (BMI): 27.5 Blood Pressure (mmHg): 128/84 Capillary Blood Glucose (mg/dl): 70 Reference Range: 80 - 120 mg / dl Notes glucose per pt report Electronic Signature(s) Signed: 03/03/2021 5:30:45 PM By: Levan Hurst RN, BSN Entered By: Levan Hurst on 03/03/2021 12:44:41

## 2021-03-11 ENCOUNTER — Other Ambulatory Visit: Payer: Self-pay

## 2021-03-11 ENCOUNTER — Encounter (HOSPITAL_BASED_OUTPATIENT_CLINIC_OR_DEPARTMENT_OTHER): Payer: Medicare Other | Admitting: Internal Medicine

## 2021-03-11 DIAGNOSIS — Z89511 Acquired absence of right leg below knee: Secondary | ICD-10-CM | POA: Diagnosis not present

## 2021-03-11 DIAGNOSIS — E1142 Type 2 diabetes mellitus with diabetic polyneuropathy: Secondary | ICD-10-CM

## 2021-03-11 DIAGNOSIS — E11621 Type 2 diabetes mellitus with foot ulcer: Secondary | ICD-10-CM

## 2021-03-11 DIAGNOSIS — L97522 Non-pressure chronic ulcer of other part of left foot with fat layer exposed: Secondary | ICD-10-CM | POA: Diagnosis not present

## 2021-03-21 ENCOUNTER — Ambulatory Visit (INDEPENDENT_AMBULATORY_CARE_PROVIDER_SITE_OTHER): Payer: Medicare Other | Admitting: Endocrinology

## 2021-03-21 ENCOUNTER — Other Ambulatory Visit: Payer: Self-pay

## 2021-03-21 VITALS — BP 146/82 | HR 86 | Ht 77.0 in | Wt 223.6 lb

## 2021-03-21 DIAGNOSIS — E1165 Type 2 diabetes mellitus with hyperglycemia: Secondary | ICD-10-CM

## 2021-03-21 MED ORDER — METFORMIN HCL ER 500 MG PO TB24: 2000.0000 mg | ORAL_TABLET | Freq: Every day | ORAL | 3 refills | Status: DC

## 2021-03-21 MED ORDER — TRULICITY 0.75 MG/0.5ML ~~LOC~~ SOAJ
0.7500 mg | SUBCUTANEOUS | 3 refills | Status: DC
Start: 2021-03-21 — End: 2021-09-02

## 2021-03-21 NOTE — Progress Notes (Signed)
Subjective:    Patient ID: [name] [name], male    DOB: [birth year], 50 y.o.   MRN: [id number]  HPI Pt returns for f/u of diabetes mellitus: DM type: 2 Dx'ed: 2015 Complications: stage 3 CRI, left foot ulcer, and right BKA (traumatic) Therapy: Trulicity and 3 oral meds.   DKA: never Severe hypoglycemia: never Pancreatitis: never Pancreatic imaging: normal on 2020 CT SDOH: none Other: Trulicity dosage is limited by n/v.   Interval history: N/v persists.  no cbg record, but states cbg's are approx 100.   Past Medical History:  Diagnosis Date  . Allergy    seasonal and environmental  . ARF (acute renal failure) (HCC) 09/2017  . Chronic constipation 05/13/2020  . Depression   . Diabetes mellitus without complication (HCC) 2019  . Hyperlipidemia   . Hypertension   . Necrotizing fasciitis (HCC) 10/13/2017  . Neuromuscular disorder (HCC)    neuropathy  . Wound dehiscence 11/24/2017    Past Surgical History:  Procedure Laterality Date  . AMPUTATION Right 10/13/2017   Procedure: RIGHT BELOW KNEE AMPUTATION;  Surgeon: Nadara Mustard, MD;  Location: [facility] OR;  Service: Orthopedics;  Laterality: Right;  . AMPUTATION Right 11/24/2017   Procedure: AMPUTATION BELOW KNEE REVISION;  Surgeon: Nadara Mustard, MD;  Location: [facility] OR;  Service: Orthopedics;  Laterality: Right;  . COLONOSCOPY  05/11/2019  . NO PAST SURGERIES    . POLYPECTOMY    . WISDOM TOOTH EXTRACTION      Social History   Socioeconomic History  . Marital status: Divorced    Spouse name: Not on file  . Number of children: 1  . Years of education: 60  . Highest education level: Associate degree: occupational, Scientist, product/process development, or vocational program  Occupational History  . Occupation: unemployed  Tobacco Use  . Smoking status: Current Every Day Smoker    Packs/day: 0.25    Years: 35.00    Pack years: 8.75    Types: Cigarettes  . Smokeless tobacco: Never Used  . Tobacco comment: stopped, but back on 1 cigarette daily.   Vaping Use  . Vaping Use: Never used  Substance and Sexual Activity  . Alcohol use: No  . Drug use: No  . Sexual activity: Not on file  Other Topics Concern  . Not on file  Social History Narrative   Originally from  [place].   Is living with his mother, who essentially supports him now.   Social Determinants of Health   Financial Resource Strain: Not on file  Food Insecurity: Not on file  Transportation Needs: Not on file  Physical Activity: Not on file  Stress: Not on file  Social Connections: Not on file  Intimate Partner Violence: Not on file    Current Outpatient Medications on File Prior to Visit  Medication Sig Dispense Refill  . AGAMATRIX ULTRA-THIN LANCETS MISC Check blood sugars before meals twice daily 100 each 11  . amitriptyline (ELAVIL) 100 MG tablet Take 200 mg by mouth at bedtime.     Marland Kitchen amLODipine (NORVASC) 10 MG tablet Take 10 mg by mouth every morning.     . ARIPiprazole (ABILIFY) 20 MG tablet Take 20 mg by mouth every morning.    Marland Kitchen atorvastatin (LIPITOR) 10 MG tablet Take 10 mg by mouth every morning.    . baclofen (LIORESAL) 10 MG tablet 1/2 tab twice a day (Patient taking differently: Take 20 mg by mouth 2 (two) times daily.) 30 each 6  . bisacodyl (DULCOLAX) 5 MG EC  tablet Take 5 mg by mouth daily as needed for moderate constipation.    . Blood Glucose Monitoring Suppl (AGAMATRIX PRESTO) w/Device KIT Check sugars twice daily before meals 1 kit 0  . Cholecalciferol-Vitamin C (VITAMIN D3-VITAMIN C PO) Take 1 tablet by mouth at bedtime.    Marland Kitchen desvenlafaxine (PRISTIQ) 100 MG 24 hr tablet Take 100 mg by mouth daily.    Marland Kitchen docusate sodium (COLACE) 100 MG capsule Take 1 capsule (100 mg total) by mouth 2 (two) times daily. (Patient taking differently: Take 200 mg by mouth daily as needed.) 10 capsule 0  . DULoxetine (CYMBALTA) 60 MG capsule Take 60 mg by mouth 2 (two) times daily.     . famotidine (PEPCID) 20 MG tablet Take 1 tablet (20 mg total) by mouth 2 (two)  times daily. 20 tablet 0  . FARXIGA 5 MG TABS tablet Take 5 mg by mouth daily.    . fluticasone (FLONASE) 50 MCG/ACT nasal spray Place 2 sprays into both nostrils daily as needed for allergies or rhinitis.     Marland Kitchen gabapentin (NEURONTIN) 300 MG capsule 1 cap by mouth in morning and midday, 2 caps by mouth at bedtime (Patient taking differently: Take 300-600 mg by mouth See admin instructions. Take one capsule (300 mg) by mouth in the morning and afternoon, take two capsules (600 mg) at bedtime) 120 capsule 11  . glipiZIDE (GLUCOTROL) 5 MG tablet 1/2 tab by mouth twice daily with meal (Patient taking differently: 5 mg. Pt states change to 5mg  tid with meals) 15 tablet 11  . glucose blood (AGAMATRIX PRESTO TEST) test strip Check blood sugars twice daily before meals 100 each 12  . lisinopril (ZESTRIL) 20 MG tablet Take 20 mg by mouth 2 (two) times daily.    Marland Kitchen olmesartan-hydrochlorothiazide (BENICAR HCT) 40-12.5 MG tablet Take 1 tablet by mouth daily.    . ondansetron (ZOFRAN) 8 MG tablet Take 8 mg by mouth every 8 (eight) hours as needed.    . polyethylene glycol (MIRALAX / GLYCOLAX) 17 g packet Take 17 g by mouth daily. 30 each 2  . metoCLOPramide (REGLAN) 5 MG tablet Take 1 tablet (5 mg total) by mouth 3 (three) times daily before meals for 10 days. 30 tablet 0   Current Facility-Administered Medications on File Prior to Visit  Medication Dose Route Frequency Provider Last Rate Last Admin  . 0.9 %  sodium chloride infusion  500 mL Intravenous Once Sherrilyn Rist, MD        Allergies  Allergen Reactions  . Adhesive [Tape] Rash    Rash at site of tape--okay to use paper tape    Family History  Problem Relation Age of Onset  . Diabetes Mellitus II Mother   . Depression Mother   . Colon polyps Mother   . Heart disease Father        CABG x 4.  04/2017  . Colon cancer Neg Hx   . Esophageal cancer Neg Hx   . Liver cancer Neg Hx   . Pancreatic cancer Neg Hx   . Rectal cancer Neg Hx   .  Stomach cancer Neg Hx     BP (!) 146/82 (BP Location: Right Arm, Patient Position: Sitting, Cuff Size: Normal)   Pulse 86   Ht 6\' 5"  (1.956 m)   Wt 223 lb 9.6 oz (101.4 kg)   SpO2 97%   BMI 26.52 kg/m    Review of Systems He denies hypoglycemia.  Objective:   Physical Exam VITAL SIGNS:  See vs page GENERAL: no distress.      Lab Results  Component Value Date   CREATININE 1.65 (H) 08/18/2019   BUN 17 08/18/2019   NA 140 08/18/2019   K 4.2 08/18/2019   CL 107 08/18/2019   CO2 25 08/18/2019       Assessment & Plan:  Type 2 DM Nausea, persistent, due to trulicity and metformin-IR  Patient Instructions  check your blood sugar 4 times a day.  vary the time of day when you check, between before the 3 meals, and at bedtime.  also check if you have symptoms of your blood sugar being too high or too low.  please keep a record of the readings and bring it to your next appointment here (or you can bring the meter itself).  You can write it on any piece of paper.  please call us sooner if your blood sugar goes below 70, or if most of your readings are over 200.   We will need to take this complex situation in stages.   please reduce the Trulicity to 0.75 mg per week, and: I have sent a prescription to your pharmacy, to change the metformin to extended-release.   Please continue the same other medications.   Please come back for a follow-up appointment in 2 months.

## 2021-03-21 NOTE — Patient Instructions (Addendum)
check your blood sugar 4 times a day.  vary the time of day when you check, between before the 3 meals, and at bedtime.  also check if you have symptoms of your blood sugar being too high or too low.  please keep a record of the readings and bring it to your next appointment here (or you can bring the meter itself).  You can write it on any piece of paper.  please call us sooner if your blood sugar goes below 70, or if most of your readings are over 200.   We will need to take this complex situation in stages.   please reduce the Trulicity to 0.86 mg per week, and: I have sent a prescription to your pharmacy, to change the metformin to extended-release.   Please continue the same other medications.   Please come back for a follow-up appointment in 2 months.

## 2021-03-25 ENCOUNTER — Other Ambulatory Visit: Payer: Self-pay

## 2021-03-25 ENCOUNTER — Encounter (HOSPITAL_BASED_OUTPATIENT_CLINIC_OR_DEPARTMENT_OTHER): Payer: Medicare Other | Attending: Internal Medicine | Admitting: Internal Medicine

## 2021-03-25 DIAGNOSIS — L97529 Non-pressure chronic ulcer of other part of left foot with unspecified severity: Secondary | ICD-10-CM | POA: Diagnosis present

## 2021-03-25 DIAGNOSIS — E11621 Type 2 diabetes mellitus with foot ulcer: Secondary | ICD-10-CM | POA: Insufficient documentation

## 2021-03-25 DIAGNOSIS — X58XXXA Exposure to other specified factors, initial encounter: Secondary | ICD-10-CM | POA: Diagnosis not present

## 2021-03-25 DIAGNOSIS — Z833 Family history of diabetes mellitus: Secondary | ICD-10-CM | POA: Diagnosis not present

## 2021-03-25 DIAGNOSIS — S81801A Unspecified open wound, right lower leg, initial encounter: Secondary | ICD-10-CM | POA: Insufficient documentation

## 2021-03-25 DIAGNOSIS — L97522 Non-pressure chronic ulcer of other part of left foot with fat layer exposed: Secondary | ICD-10-CM

## 2021-03-25 DIAGNOSIS — Z89511 Acquired absence of right leg below knee: Secondary | ICD-10-CM | POA: Insufficient documentation

## 2021-03-25 DIAGNOSIS — E1142 Type 2 diabetes mellitus with diabetic polyneuropathy: Secondary | ICD-10-CM | POA: Diagnosis not present

## 2021-03-25 DIAGNOSIS — F17200 Nicotine dependence, unspecified, uncomplicated: Secondary | ICD-10-CM | POA: Insufficient documentation

## 2021-03-28 NOTE — Progress Notes (Signed)
[name] [name], [name] [name] ([id number]) Visit Report for 03/25/2021 Arrival Information Details Patient Name: Date of Service: [name] [name] 03/25/2021 12:30 PM Medical Record Number: [id number] Patient Account Number: [id number] Date of Birth/Sex: Treating RN: [birth year] (50 y.o. [name] [name], [name] [name] Primary Care [name] [name]: [name] [name] Other Clinician: Referring [name] [name]: Treating [name] [name]/Extender: Delano Metz in Treatment: 29 Visit Information History Since Last Visit Added or deleted any medications: No Patient Arrived: Ambulatory Any new allergies or adverse reactions: No Arrival Time: 12:29 Had a fall or experienced change in No Accompanied By: self activities of daily living that may affect Transfer Assistance: None risk of falls: Patient Identification Verified: Yes Signs or symptoms of abuse/neglect since last visito No Secondary Verification Process Completed: Yes Hospitalized since last visit: No Patient Requires Transmission-Based Precautions: No Implantable device outside of the clinic excluding No Patient Has Alerts: No cellular tissue based products placed in the center since last visit: Has Dressing in Place as Prescribed: Yes Pain Present Now: No Electronic Signature(s) Signed: 03/26/2021 6:54:10 PM By: Deon Pilling Entered By: Deon Pilling on 03/25/2021 12:30:03 -------------------------------------------------------------------------------- Encounter Discharge Information Details Patient Name: Date of Service: [name] [name], [place] 03/25/2021 12:30 PM Medical Record Number: [id number] Patient Account Number: [id number] Date of Birth/Sex: Treating RN: 1971-06-16 (50 y.o. [name] [name] Primary Care [name] [name]: [name] [name] Other Clinician: Referring [name] [name]: Treating Jillene Wehrenberg/Extender: Delano Metz in Treatment: 71 Encounter Discharge Information Items Post Procedure Vitals Discharge Condition:  Stable Temperature (F): 98.8 Ambulatory Status: Ambulatory Pulse (bpm): 96 Discharge Destination: Home Respiratory Rate (breaths/min): 20 Transportation: Private Auto Blood Pressure (mmHg): 119/77 Accompanied By: self Schedule Follow-up Appointment: Yes Clinical Summary of Care: Electronic Signature(s) Signed: 03/26/2021 6:54:10 PM By: Deon Pilling Entered By: Deon Pilling on 03/25/2021 12:51:29 -------------------------------------------------------------------------------- Lower Extremity Assessment Details Patient Name: Date of Service: [name] Arrow [name] 03/25/2021 12:30 PM Medical Record Number: [id number] Patient Account Number: [id number] Date of Birth/Sex: Treating RN: Jul 09, 1971 (50 y.o. [name] [name] Primary Care Margreat Widener: [name] [name] Other Clinician: Referring Aditri Louischarles: Treating Anayelli Lai/Extender: Delano Metz in Treatment: 31 Edema Assessment Assessed: [name]: Yes] [name] [name]: No] Edema: [Left: Ye] [Right: s] Calf Left: Right: Point of Measurement: 50 cm From Medial Instep 73.4 cm Ankle Left: Right: Point of Measurement: 11 cm From Medial Instep 24 cm Vascular Assessment Pulses: Dorsalis Pedis Palpable: [Left:Yes] Electronic Signature(s) Signed: 03/26/2021 6:54:10 PM By: Deon Pilling Entered By: Deon Pilling on 03/25/2021 12:32:43 -------------------------------------------------------------------------------- Multi Wound Chart Details Patient Name: Date of Service: [name] [name], [place] 03/25/2021 12:30 PM Medical Record Number: [id number] Patient Account Number: [id number] Date of Birth/Sex: Treating RN: [birth year] (50 y.o. [name] [name], [name] [name] Primary Care [name] [name]: [name] [name] Other Clinician: Referring [name] [name]: Treating [name] [name]/Extender: Delano Metz in Treatment: 31 Vital Signs Height(in): 77 Capillary Blood Glucose(mg/dl): 73 Weight(lbs): 232 Pulse(bpm): 96 Body Mass  Index(BMI): 28 Blood Pressure(mmHg): 119/77 Temperature(F): 98.8 Respiratory Rate(breaths/min): 20 Photos: [2R:No Photos Left Metatarsal head first] [N/A:N/A N/A] Wound Location: [2R:Blister] [N/A:N/A] Wounding Event: [2R:Diabetic Wound/Ulcer of the Lower] [N/A:N/A] Primary Etiology: [2R:Extremity Anemia, Hypertension, Peripheral] [N/A:N/A] Comorbid History: [2R:Venous Disease, Type II Diabetes 06/23/2020] [N/A:N/A] Date Acquired: [2R:31] [N/A:N/A] Weeks of Treatment: [2R:Open] [N/A:N/A] Wound Status: [2R:Yes] [N/A:N/A] Wound Recurrence: [2R:0.2x0.2x0.2] [N/A:N/A] Measurements L x W x D (cm) [2R:0.031] [N/A:N/A] A (cm) : rea [2R:0.006] [N/A:N/A] Volume (cm) : [2R:0.00%] [N/A:N/A] % Reduction in A rea: [2R:-100.00%] [N/A:N/A] % Reduction in Volume: [2R:12] Starting Position 1 (o'clock): [2R:12] Ending Position 1 (o'clock): [2R:0.2]  Maximum Distance 1 (cm): [2R:Yes] [N/A:N/A] Undermining: [2R:Grade 2] [N/A:N/A] Classification: [2R:Medium] [N/A:N/A] Exudate A mount: [2R:Serosanguineous] [N/A:N/A] Exudate Type: [2R:red, brown] [N/A:N/A] Exudate Color: [2R:Well defined, not attached] [N/A:N/A] Wound Margin: [2R:Large (67-100%)] [N/A:N/A] Granulation A mount: [2R:Pink, Pale] [N/A:N/A] Granulation Quality: [2R:None Present (0%)] [N/A:N/A] Necrotic A mount: [2R:Fat Layer (Subcutaneous Tissue): Yes N/A] Exposed Structures: [2R:Fascia: No Tendon: No Muscle: No Joint: No Bone: No Small (1-33%)] [N/A:N/A] Epithelialization: [2R:Debridement - Excisional] [N/A:N/A] Debridement: Pre-procedure Verification/Time Out 12:43 [N/A:N/A] Taken: [2R:Lidocaine 4% Topical Solution] [N/A:N/A] Pain Control: [2R:Callus, Subcutaneous] [N/A:N/A] Tissue Debrided: [2R:Skin/Subcutaneous Tissue] [N/A:N/A] Level: [2R:0.25] [N/A:N/A] Debridement A (sq cm): [2R:rea Curette] [N/A:N/A] Instrument: [2R:Minimum] [N/A:N/A] Bleeding: [2R:Pressure] [N/A:N/A] Hemostasis A chieved: [2R:0] [N/A:N/A] Procedural Pain:  [2R:0] [N/A:N/A] Post Procedural Pain: [2R:Procedure was tolerated well] [N/A:N/A] Debridement Treatment Response: [2R:0.3x0.3x0.2] [N/A:N/A] Post Debridement Measurements L x W x D (cm) [2R:0.014] [N/A:N/A] Post Debridement Volume: (cm) [2R:callous periwound.] [N/A:N/A] Assessment Notes: [2R:Debridement] [N/A:N/A] Treatment Notes Wound #2R (Metatarsal head first) Wound Laterality: Left Cleanser Soap and Water Discharge Instruction: May shower and wash wound with dial antibacterial soap and water prior to dressing change. Wound Cleanser Discharge Instruction: Cleanse the wound with wound cleanser prior to applying a clean dressing using gauze sponges, not tissue or cotton balls. Peri-Wound Care Sween Lotion (Moisturizing lotion) Discharge Instruction: Apply moisturizing lotion as directed Topical Primary Dressing Promogran Prisma Matrix, 4.34 (sq in) (silver collagen) Discharge Instruction: Moisten collagen with saline or hydrogel Secondary Dressing Woven Gauze Sponges 2x2 in Discharge Instruction: Apply over primary dressing as directed. Optifoam Non-Adhesive Dressing, 4x4 in Discharge Instruction: Apply over primary dressing cut to make foam donut to help offload Secured With Conforming Stretch Gauze Bandage, Sterile 2x75 (in/in) Discharge Instruction: Secure with stretch gauze as directed. 46M Medipore H Soft Cloth Surgical T ape, 2x2 (in/yd) Discharge Instruction: Secure dressing with tape as directed. Compression Wrap Compression Stockings Add-Ons Electronic Signature(s) Signed: 03/25/2021 1:15:14 PM By: Kalman Shan DO Signed: 03/28/2021 3:14:58 PM By: Rhae Hammock RN Entered By: Kalman Shan on 03/25/2021 13:05:44 -------------------------------------------------------------------------------- Multi-Disciplinary Care Plan Details Patient Name: Date of Service: [name] [name], [place] 03/25/2021 12:30 PM Medical Record Number: [id number] Patient Account Number:  [id number] Date of Birth/Sex: Treating RN: 1971/08/10 (50 y.o. [name] [name] Primary Care Xian Apostol: [name] [name] Other Clinician: Referring Lakyia Behe: Treating Danta Baumgardner/Extender: Delano Metz in Treatment: [place] reviewed with physician Active Inactive Wound/Skin Impairment Nursing Diagnoses: Impaired tissue integrity Goals: Patient/caregiver will verbalize understanding of skin care regimen Date Initiated: 11/19/2020 Target Resolution Date: 04/11/2021 Goal Status: Active Ulcer/skin breakdown will have a volume reduction of 30% by week 4 Date Initiated: 08/16/2020 Date Inactivated: 03/03/2021 Target Resolution Date: 03/10/2021 Goal Status: Met Interventions: Provide education on ulcer and skin care Notes: Electronic Signature(s) Signed: 03/26/2021 6:54:10 PM By: Deon Pilling Entered By: Deon Pilling on 03/25/2021 12:36:30 -------------------------------------------------------------------------------- Pain Assessment Details Patient Name: Date of Service: [name] Arrow [name] 03/25/2021 12:30 PM Medical Record Number: [id number] Patient Account Number: [id number] Date of Birth/Sex: Treating RN: 10/29/1971 (50 y.o. [name] [name] Primary Care Kire Ferg: [name] [name] Other Clinician: Referring Jimi Schappert: Treating Rudi Knippenberg/Extender: Delano Metz in Treatment: 31 Active Problems Location of Pain Severity and Description of Pain Patient Has Paino No Patient Has Paino No Site Locations Rate the pain. Current Pain Level: 0 Pain Management and Medication Current Pain Management: Medication: No Cold Application: No Rest: No Massage: No Activity: No T.E.N.S.: No Heat Application: No Leg drop or elevation: No Is the Current Pain  Management Adequate: Adequate How does your wound impact your activities of daily livingo Sleep: No Bathing: No Appetite: No Relationship With Others:  No Bladder Continence: No Emotions: No Bowel Continence: No Work: No Toileting: No Drive: No Dressing: No Hobbies: No Electronic Signature(s) Signed: 03/26/2021 6:54:10 PM By: Deon Pilling Entered By: Deon Pilling on 03/25/2021 12:30:53 -------------------------------------------------------------------------------- Patient/Caregiver Education Details Patient Name: Date of Service: [name] [name] 5/3/2022andnbsp12:30 PM Medical Record Number: [id number] Patient Account Number: [id number] Date of Birth/Gender: Treating RN: 1971-10-07 (50 y.o. [name] [name] Primary Care Physician: [name] [name] Other Clinician: Referring Physician: Treating Physician/Extender: Delano Metz in Treatment: 56 Education Assessment Education Provided To: Patient Education Topics Provided Wound/Skin Impairment: Handouts: Skin Care Do's and Dont's Methods: Explain/Verbal Responses: Reinforcements needed Electronic Signature(s) Signed: 03/26/2021 6:54:10 PM By: Deon Pilling Entered By: Deon Pilling on 03/25/2021 12:36:42 -------------------------------------------------------------------------------- Wound Assessment Details Patient Name: Date of Service: [name] [name], [place] 03/25/2021 12:30 PM Medical Record Number: [id number] Patient Account Number: [id number] Date of Birth/Sex: Treating RN: 1971-11-13 (50 y.o. [name] [name], [name] [name] Primary Care Hephzibah Strehle: [name] [name] Other Clinician: Referring Christoher Drudge: Treating Carlicia Leavens/Extender: Delano Metz in Treatment: 31 Wound Status Wound Number: 2R Primary Diabetic Wound/Ulcer of the Lower Extremity Etiology: Wound Location: Left Metatarsal head first Wound Status: Open Wounding Event: Blister Comorbid Anemia, Hypertension, Peripheral Venous Disease, Type II Date Acquired: 06/23/2020 History: Diabetes Weeks Of Treatment: 31 Clustered Wound: No Photos Wound Measurements Length:  (cm) 0.2 % Red Width: (cm) 0.2 % Red Depth: (cm) 0.2 Epith Area: (cm) 0.031 Tunn Volume: (cm) 0.006 Un[address] St En Ma uction in Area: 0% uction in Volume: -100% elialization: Small (1-33%) eling: No rmining: Yes arting Position (o'clock): 12 ding Position (o'clock): 12 ximum Distance: (cm) 0.2 Wound Description Classification: Grade 2 Foul Wound Margin: Well defined, not attached Sloug Exudate Amount: Medium Exudate Type: Serosanguineous Exudate Color: red, brown Odor After Cleansing: No h/Fibrino No Wound Bed Granulation Amount: Large (67-100%) Exposed Structure Granulation Quality: Pink, Pale Fascia Exposed: No Necrotic Amount: None Present (0%) Fat Layer (Subcutaneous Tissue) Exposed: Yes Tendon Exposed: No Muscle Exposed: No Joint Exposed: No Bone Exposed: No Assessment Notes callous periwound. Treatment Notes Wound #2R (Metatarsal head first) Wound Laterality: Left Cleanser Soap and Water Discharge Instruction: May shower and wash wound with dial antibacterial soap and water prior to dressing change. Wound Cleanser Discharge Instruction: Cleanse the wound with wound cleanser prior to applying a clean dressing using gauze sponges, not tissue or cotton balls. Peri-Wound Care Sween Lotion (Moisturizing lotion) Discharge Instruction: Apply moisturizing lotion as directed Topical Primary Dressing Promogran Prisma Matrix, 4.34 (sq in) (silver collagen) Discharge Instruction: Moisten collagen with saline or hydrogel Secondary Dressing Woven Gauze Sponges 2x2 in Discharge Instruction: Apply over primary dressing as directed. Optifoam Non-Adhesive Dressing, 4x4 in Discharge Instruction: Apply over primary dressing cut to make foam donut to help offload Secured With Conforming Stretch Gauze Bandage, Sterile 2x75 (in/in) Discharge Instruction: Secure with stretch gauze as directed. 67M Medipore H Soft Cloth Surgical T ape, 2x2 (in/yd) Discharge Instruction: Secure  dressing with tape as directed. Compression Wrap Compression Stockings Add-Ons Electronic Signature(s) Signed: 03/25/2021 4:47:39 PM By: Sandre Kitty Signed: 03/26/2021 6:54:10 PM By: Deon Pilling Entered By: Sandre Kitty on 03/25/2021 16:44:25 -------------------------------------------------------------------------------- Vitals Details Patient Name: Date of Service: STRICKLA [name], [place] 03/25/2021 12:30 PM Medical Record Number: [id number] Patient Account Number: [id number] Date of Birth/Sex: Treating RN: 1971/06/26 (50 y.o. [name] [name] Primary Care Lennyx Verdell:  [name] [name] Other Clinician: Referring Niyam Bisping: Treating Nalina Yeatman/Extender: Delano Metz in Treatment: 31 Vital Signs Time Taken: 12:30 Temperature (F): 98.8 Height (in): 77 Pulse (bpm): 96 Weight (lbs): 232 Respiratory Rate (breaths/min): 20 Body Mass Index (BMI): 27.5 Blood Pressure (mmHg): 119/77 Capillary Blood Glucose (mg/dl): 73 Reference Range: 80 - 120 mg / dl Electronic Signature(s) Signed: 03/26/2021 6:54:10 PM By: Deon Pilling Entered By: Deon Pilling on 03/25/2021 12:32:05

## 2021-03-28 NOTE — Progress Notes (Signed)
JAHAN, FRIEDLANDER ([id number]) Visit Report for 03/11/2021 Chief Complaint Document Details Patient Name: Date of Service: [name] [name] 03/11/2021 12:30 PM Medical Record Number: [id number] Patient Account Number: [id number] Date of Birth/Sex: Treating RN: Jul 12, 1971 (50 y.o. [name] [name] Primary Care Provider: Ferd [name] Other Clinician: Referring Provider: Treating Provider/Extender: [name] [name]: 29 Information Obtained from: Patient Chief Complaint 04/21/18; patient is here for review of a wound on his plantar left first metatarsal head 08/16/2020; patient is here for a review of the wound on his plantar left first metatarsal head Electronic Signature(s) Signed: 03/11/2021 1:55:10 PM By: [name] Shan DO Entered By: [name] [name] on 03/11/2021 13:44:59 -------------------------------------------------------------------------------- Debridement Details Patient Name: Date of Service: [name] [name], [name] 03/11/2021 12:30 PM Medical Record Number: [id number] Patient Account Number: [id number] Date of Birth/Sex: Treating RN: 06/04/1971 (50 y.o. [name] [name] Primary Care Provider: Ferd [name] Other Clinician: Referring Provider: Treating Provider/Extender: [name] [name]: 29 Debridement Performed for Assessment: Wound #2R Left Metatarsal head first Performed By: Physician [name] Shan, DO Debridement Type: Debridement Severity of Tissue Pre Debridement: Fat layer exposed Level of Consciousness (Pre-procedure): Awake and Alert Pre-procedure Verification/Time Out Yes - 13:02 Taken: Start Time: 13:02 Pain Control: Lidocaine T Area Debrided (L x W): otal 0.3 (cm) x 0.4 (cm) = 0.12 (cm) Tissue and other material debrided: Viable, Non-Viable, Callus, Slough, Subcutaneous, Skin: Dermis , Skin: Epidermis, Slough Level: Skin/Subcutaneous Tissue Debridement  Description: Excisional Instrument: Curette Bleeding: Minimum Hemostasis Achieved: Pressure End Time: 13:03 Procedural Pain: 0 Post Procedural Pain: 0 Response to [name]: Procedure was tolerated well Level of Consciousness (Post- Awake and Alert procedure): Post Debridement Measurements of Total Wound Length: (cm) 0.3 Width: (cm) 0.4 Depth: (cm) 0.3 Volume: (cm) 0.028 Character of Wound/Ulcer Post Debridement: Improved Severity of Tissue Post Debridement: Fat layer exposed Post Procedure Diagnosis Same as Pre-procedure Electronic Signature(s) Signed: 03/11/2021 1:55:10 PM By: [name] Shan DO Signed: 03/28/2021 3:14:58 PM By: [name] Hammock RN Entered By: [name] [name] on 03/11/2021 13:02:44 -------------------------------------------------------------------------------- HPI Details Patient Name: Date of Service: [name] [name], [name] 03/11/2021 12:30 PM Medical Record Number: [id number] Patient Account Number: [id number] Date of Birth/Sex: Treating RN: 1970-12-04 (50 y.o. [name] [name] Primary Care Provider: Ferd [name] Other Clinician: Referring Provider: Treating Provider/Extender: [name] [name]: 29 History of Present Illness HPI Description: 04/21/18 ADMISSION This is a 50 year old man who is a type II diabetic. In spite of fact his hemoglobin A1c is actually quite good 5.73 months ago he is at a really difficult time over the last 6-7 months. He developed a rapidly progressive infection in the right foot in November 2018 associated with osteomyelitis and necrotizing fasciitis. 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 The stump required and a revision on 11/24/17. The stump revision was advertised as being secondary to falls. I'm not sure the progressive history here however this area is actually closed over. The patient tells me over the same timeframe he has had wounds on the plantar aspect of his left foot. In the ED  saw Dr. Sharol Given in April of this year. 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 It is really not clear that this patient is been dressing this with anything. He came in with the clinic without any specific dressing on the wound areas using his own tennis shoe. He tells me that he only ambulates of  course to do a pivot transfer. He does not have his prosthesis for the right leg as of yet and he blames Medicaid for this. He does however use the foot to push himself along in his wheelchair at home. The patient has not had formal arterial studies. ABI in our clinic on the left was 1.13. Patient's past medical history includes type 2 diabetes, fracture of the right fibula. I see that he was treated for abscesses on his right buttock and chest in 2016. I did not look at the microbiology of this. 04/28/18; patient comes back in the clinic today with the wound pretty much the same as when he came in here last week. Small opening lots of undermining relatively. He tells [place] that nothing is really been on this for 3 days in spite of the fact that we gave him enough to dress this easily especially such a small wound. He says he lives with his mother, she is not capable of assisting with this he is changing the dressings himself. 05/05/18; much better-looking wound today. Smaller. There is some undermining medially however that's only perhaps 2 mm. No surrounding erythema. We've been using silver collagen 05/12/18; small wound on the first metatarsal head. No undermining no surrounding erythema. We've been using silver collagen he has home health coming out to change 05/20/18 the wound on the first metatarsal head looks better. Covered in surface debris/callus nonviable tissue. Required debridement but post debridement this looks quite good. We've been using silver collagen. He has home health 05/27/18; first metatarsal head wound continues to improve. Just about completely  closed. Still a lot of surface debridement callus. We've been using silver collagen 06/06/18; the first metatarsal wound is completely healed over. There is still a lot of callus. I gently removed some of this just to make sure that there was no open area and there is not. The patient mentions to me that his left leg has felt like "lead" for about a week READMISSION 08/16/2020 This is a 50 year old man with type 2 diabetes. He returns to clinic today with a 1 month history of a blister and callus over the first metatarsal head. This is in exactly the same area as when he was here in 2019. 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 He has standard running shoes on the left foot to match the area in his prosthesis. He has only been covering this area with a Band-Aid has not really been specifically dressing this. ABI in our clinic on the left was 1.16. This is essentially stable from the value in 2019 10/1 the patient has a linear wound over the left first metatarsal head. Again not a lot of callus thick skin around this that I removed with a #3 curette. This is not go deep to bone or muscle it does not look to be infected. 10/8 first metatarsal head. The wound looks like it is closing however this is going to be a difficult area to offload in the future. He has a size 15 shoe and he says his shoes are years old. The inserts in these current shoes are totally useless and I have advised him to replace these. He may need a new pair of shoes and I have he got original prosthesis at [facility] [place] I have asked him to go back there. 11/9; the patient has not been here in more than a month. Not sure he was  healed when he was here the last time. I told him he would have to get new shoes and certainly offload the area over the left first metatarsal head. He has done nothing of the kind. He arrives back in clinic with the same old new balance sneakers. A very  large separating callus over the first metatarsal head on the left 11/16; he has purchased new shoes and has at least 2 insoles like I asked. The wound is measuring much smaller. He is using silver alginate he changes the dressing himself 11/30; wound is measurably smaller in length of about a half a centimeter. This is generally very little depth. We have been using silver alginate. He changes the dressing himself every second day. 12/14; wound is about the same size. Significant undermining medially relative to the size of the wound. We have been using silver alginate. There is not a way to offload this area as he walks with a prosthesis on the right leg 12/28; wound is about the same size. Again he has undermining medially. I am using polymen on this 12/02/2020; the wound looks smaller. Still a senescent edge. We have been using polymen 1/24; the wound is come down a few millimeters. Still a senescent edge. I have been using polymen 2/8; the wound is come down slightly. Still with slight undermining from 12-6 o'clock I have been using polymen partially to get offloading on this area which is opposed by his prosthesis on the other leg. 2/22 quite open improvement he still has a comma shaped wound on the left first metatarsal head. Some depth. Using polymen 3/14; he still has the same problem of a comma shaped area over the left first metatarsal head. This seems to have had more depth today at 0.6 cm. We have been using polymen. The patient changes this himself every second day. I explored the idea of a total contact cast on the left leg however this is his driving foot. He was not enamored with the idea of not being able to drive and states that he does not have anybody else to get him around 3/28; again he comes in with a smaller looking wound however there is depth and undermining requiring debridement. We have been using Hydrofera Blue, changed to silver collagen today. The patient is doing the  dressing himself 4/11; patient presents today for follow-up of his left diabetic foot wound. He denies any issues in the past 2 weeks. He is currently using silver collagen every other day with dressing changes. He does not use diabetic shoes or special inserts to his sneakers. He does not use felt pads for offloading. 4/19; patient presents for his 1 week follow-up. He denies any issues. He reports using Hydrofera Blue and has not been using silver collagen for dressing changes. He reports minimal drainage to the wound. He overall feels well. Electronic Signature(s) Signed: 03/11/2021 1:55:10 PM By: [name] Shan DO Entered By: [name] [name] on 03/11/2021 13:45:58 -------------------------------------------------------------------------------- Physical Exam Details Patient Name: Date of Service: [name] [name], [name] 03/11/2021 12:30 PM Medical Record Number: [id number] Patient Account Number: [id number] Date of Birth/Sex: Treating RN: January 08, 1971 (50 y.o. [name] [name] Primary Care Provider: Ferd [name] Other Clinician: Referring Provider: Treating Provider/Extender: [name] [name]: 29 Constitutional respirations regular, non-labored and within target range for patient.. Cardiovascular 2+ dorsalis pedis/posterior tibialis pulses. Notes Left plantar first metatarsal: Callus present although improved from the last clinic visit. There is granulation tissue to the depth of  the wound. No signs of infection. Electronic Signature(s) Signed: 03/11/2021 1:55:10 PM By: [name] Shan DO Entered By: [name] [name] on 03/11/2021 13:46:56 -------------------------------------------------------------------------------- Physician Orders Details Patient Name: Date of Service: [name] [name], [name] 03/11/2021 12:30 PM Medical Record Number: [id number] Patient Account Number: [id number] Date of Birth/Sex: Treating RN: 1971/01/07 (50 y.o. [name] [name] Primary Care Provider: Ferd [name] Other Clinician: Referring Provider: Treating Provider/Extender: [name] [name]: 11 Verbal / Phone Orders: No Diagnosis Coding ICD-10 Coding Code Description E11.621 Type 2 diabetes mellitus with foot ulcer L97.522 Non-pressure chronic ulcer of other part of left foot with fat layer exposed E11.42 Type 2 diabetes mellitus with diabetic polyneuropathy Z89.511 Acquired absence of right leg below knee Follow-up Appointments ppointment in 2 weeks. - Dr. Heber [place] Return A Bathing/ Shower/ Hygiene May shower and wash wound with soap and water. - WITH DRESSING CHANGES. Edema Control - Lymphedema / SCD / Other Elevate legs to the level of the heart or above for 30 minutes daily and/or when sitting, a frequency of: Avoid standing for long periods of time. Off-Loading Other: - use felt for offloading Wound [name] Wound #2R - Metatarsal head first Wound Laterality: Left Cleanser: Soap and Water Every Other Day/7 Days Discharge Instructions: May shower and wash wound with dial antibacterial soap and water prior to dressing change. Cleanser: Wound Cleanser (Generic) Every Other Day/7 Days Discharge Instructions: Cleanse the wound with wound cleanser prior to applying a clean dressing using gauze sponges, not tissue or cotton balls. Peri-Wound Care: Sween Lotion (Moisturizing lotion) Every Other Day/7 Days Discharge Instructions: Apply moisturizing lotion as directed Prim Dressing: Promogran Prisma Matrix, 4.34 (sq in) (silver collagen) Every Other Day/7 Days ary Discharge Instructions: Moisten collagen with saline or hydrogel Secondary Dressing: Woven Gauze Sponges 2x2 in (Generic) Every Other Day/7 Days Discharge Instructions: Apply over primary dressing as directed. Secondary Dressing: Optifoam Non-Adhesive Dressing, 4x4 in (Generic) Every Other Day/7 Days Discharge Instructions: Apply over  primary dressing cut to make foam donut to help offload Secured With: Conforming Stretch Gauze Bandage, Sterile 2x75 (in/in) (Generic) Every Other Day/7 Days Discharge Instructions: Secure with stretch gauze as directed. Secured With: 73M Medipore H Soft Cloth Surgical Tape, 2x2 (in/yd) (Generic) Every Other Day/7 Days Discharge Instructions: Secure dressing with tape as directed. Electronic Signature(s) Signed: 03/11/2021 1:55:10 PM By: [name] Shan DO Entered By: [name] [name] on 03/11/2021 13:47:15 -------------------------------------------------------------------------------- Problem List Details Patient Name: Date of Service: [name] [name], [name] 03/11/2021 12:30 PM Medical Record Number: [id number] Patient Account Number: [id number] Date of Birth/Sex: Treating RN: 1971/10/13 (50 y.o. [name] [name] Primary Care Provider: Ferd [name] Other Clinician: Referring Provider: Treating Provider/Extender: [name] [name]: 29 Active Problems ICD-10 Encounter Code Description Active Date MDM Diagnosis E11.621 Type 2 diabetes mellitus with foot ulcer 08/16/2020 No Yes L97.522 Non-pressure chronic ulcer of other part of left foot with fat layer exposed 08/16/2020 No Yes E11.42 Type 2 diabetes mellitus with diabetic polyneuropathy 08/16/2020 No Yes Z89.511 Acquired absence of right leg below knee 08/16/2020 No Yes Inactive Problems Resolved Problems Electronic Signature(s) Signed: 03/11/2021 1:55:10 PM By: [name] Shan DO Entered By: [name] [name] on 03/11/2021 13:44:42 -------------------------------------------------------------------------------- Progress Note Details Patient Name: Date of Service: [name] [name], [name] 03/11/2021 12:30 PM Medical Record Number: [id number] Patient Account Number: [id number] Date of Birth/Sex: Treating RN: [birth year] (50 y.o. [name] [name] Primary Care Provider: Ferd [name] Other  Clinician: Referring Provider: Treating Provider/Extender: Loretta Plume,  Milon Dikes in [name]: 29 Subjective Chief Complaint Information obtained from Patient 04/21/18; patient is here for review of a wound on his plantar left first metatarsal head 08/16/2020; patient is here for a review of the wound on his plantar left first metatarsal head History of Present Illness (HPI) 04/21/18 ADMISSION This is a 50 year old man who is a type II diabetic. In spite of fact his hemoglobin A1c is actually quite good 5.73 months ago he is at a really difficult time over the last 6-7 months. He developed a rapidly progressive infection in the right foot in November 2018 associated with osteomyelitis and necrotizing fasciitis. 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 The stump required and a revision on 11/24/17. The stump revision was advertised as being secondary to falls. I'm not sure the progressive history here however this area is actually closed over. The patient tells me over the same timeframe he has had wounds on the plantar aspect of his left foot. In the ED saw Dr. Sharol Given in April of this year. 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 It is really not clear that this patient is been dressing this with anything. He came in with the clinic without any specific dressing on the wound areas using his own tennis shoe. He tells me that he only ambulates of course to do a pivot transfer. He does not have his prosthesis for the right leg as of yet and he blames Medicaid for this. He does however use the foot to push himself along in his wheelchair at home. The patient has not had formal arterial studies. ABI in our clinic on the left was 1.13. Patient's past medical history includes type 2 diabetes, fracture of the right fibula. I see that he was treated for abscesses on his right buttock and chest in 2016. I did not look at the microbiology of  this. 04/28/18; patient comes back in the clinic today with the wound pretty much the same as when he came in here last week. Small opening lots of undermining relatively. He tells [place] that nothing is really been on this for 3 days in spite of the fact that we gave him enough to dress this easily especially such a small wound. He says he lives with his mother, she is not capable of assisting with this he is changing the dressings himself. 05/05/18; much better-looking wound today. Smaller. There is some undermining medially however that's only perhaps 2 mm. No surrounding erythema. We've been using silver collagen 05/12/18; small wound on the first metatarsal head. No undermining no surrounding erythema. We've been using silver collagen he has home health coming out to change 05/20/18 the wound on the first metatarsal head looks better. Covered in surface debris/callus nonviable tissue. Required debridement but post debridement this looks quite good. We've been using silver collagen. He has home health 05/27/18; first metatarsal head wound continues to improve. Just about completely closed. Still a lot of surface debridement callus. We've been using silver collagen 06/06/18; the first metatarsal wound is completely healed over. There is still a lot of callus. I gently removed some of this just to make sure that there was no open area and there is not. The patient mentions to me that his left leg has felt like "lead" for about a week READMISSION 08/16/2020 This is a 50 year old man with type 2 diabetes. He returns to clinic today with a 1 month history of a blister  and callus over the first metatarsal head. This is in exactly the same area as when he was here in 2019. 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 He has standard running shoes on the left foot to match the area in his prosthesis. He has only been covering this area with a Band-Aid has not really been  specifically dressing this. ABI in our clinic on the left was 1.16. This is essentially stable from the value in 2019 10/1 the patient has a linear wound over the left first metatarsal head. Again not a lot of callus thick skin around this that I removed with a #3 curette. This is not go deep to bone or muscle it does not look to be infected. 10/8 first metatarsal head. The wound looks like it is closing however this is going to be a difficult area to offload in the future. He has a size 15 shoe and he says his shoes are years old. The inserts in these current shoes are totally useless and I have advised him to replace these. He may need a new pair of shoes and I have he got original prosthesis at [employer] [place] I have asked him to go back there. 11/9; the patient has not been here in more than a month. Not sure he was healed when he was here the last time. I told him he would have to get new shoes and certainly offload the area over the left first metatarsal head. He has done nothing of the kind. He arrives back in clinic with the same old new balance sneakers. A very large separating callus over the first metatarsal head on the left 11/16; he has purchased new shoes and has at least 2 insoles like I asked. The wound is measuring much smaller. He is using silver alginate he changes the dressing himself 11/30; wound is measurably smaller in length of about a half a centimeter. This is generally very little depth. We have been using silver alginate. He changes the dressing himself every second day. 12/14; wound is about the same size. Significant undermining medially relative to the size of the wound. We have been using silver alginate. There is not a way to offload this area as he walks with a prosthesis on the right leg 12/28; wound is about the same size. Again he has undermining medially. I am using polymen on this 12/02/2020; the wound looks smaller. Still a senescent edge. We have been  using polymen 1/24; the wound is come down a few millimeters. Still a senescent edge. I have been using polymen 2/8; the wound is come down slightly. Still with slight undermining from 12-6 o'clock I have been using polymen partially to get offloading on this area which is opposed by his prosthesis on the other leg. 2/22 quite open improvement he still has a comma shaped wound on the left first metatarsal head. Some depth. Using polymen 3/14; he still has the same problem of a comma shaped area over the left first metatarsal head. This seems to have had more depth today at 0.6 cm. We have been using polymen. The patient changes this himself every second day. I explored the idea of a total contact cast on the left leg however this is his driving foot. He was not enamored with the idea of not being able to drive and states that he does not have anybody else to get him around 3/28;  again he comes in with a smaller looking wound however there is depth and undermining requiring debridement. We have been using Hydrofera Blue, changed to silver collagen today. The patient is doing the dressing himself 4/11; patient presents today for follow-up of his left diabetic foot wound. He denies any issues in the past 2 weeks. He is currently using silver collagen every other day with dressing changes. He does not use diabetic shoes or special inserts to his sneakers. He does not use felt pads for offloading. 4/19; patient presents for his 1 week follow-up. He denies any issues. He reports using Hydrofera Blue and has not been using silver collagen for dressing changes. He reports minimal drainage to the wound. He overall feels well. Patient History Information obtained from Patient. Family History Diabetes - Maternal Grandparents,Mother, Heart Disease - Father, Hypertension - Father, No family history of Cancer, Hereditary Spherocytosis, Kidney Disease, Lung Disease, Seizures, Stroke, Thyroid Problems,  Tuberculosis. Social History Current every day smoker, Marital Status - Single, Alcohol Use - Never, Drug Use - No History, Caffeine Use - Never. Medical History Eyes Denies history of Cataracts, Glaucoma, Optic Neuritis Ear/Nose/Mouth/Throat Denies history of Chronic sinus problems/congestion, Middle ear problems Hematologic/Lymphatic Patient has history of Anemia Denies history of Hemophilia, Human Immunodeficiency Virus, Lymphedema, Sickle Cell Disease Respiratory Denies history of Aspiration, Asthma, Chronic Obstructive Pulmonary Disease (COPD), Pneumothorax, Sleep Apnea, Tuberculosis Cardiovascular Patient has history of Hypertension, Peripheral Venous Disease Denies history of Angina, Arrhythmia, Congestive Heart Failure, Coronary Artery Disease, Deep Vein Thrombosis, Hypotension, Myocardial Infarction, Peripheral Arterial Disease, Phlebitis, Vasculitis Gastrointestinal Denies history of Cirrhosis , Colitis, Crohnoos, Hepatitis A, Hepatitis B, Hepatitis C Endocrine Patient has history of Type II Diabetes - oral meds Denies history of Type I Diabetes Genitourinary Denies history of End Stage Renal Disease Immunological Denies history of Lupus Erythematosus, Raynaudoos, Scleroderma Integumentary (Skin) Denies history of History of Burn Musculoskeletal Denies history of Gout, Rheumatoid Arthritis, Osteoarthritis, Osteomyelitis Neurologic Denies history of Dementia, Neuropathy, Quadriplegia, Paraplegia, Seizure Disorder Oncologic Denies history of Received Chemotherapy, Received Radiation Psychiatric Denies history of Anorexia/bulimia, Confinement Anxiety Medical A Surgical History Notes [name] Constitutional Symptoms (General Health) falls , leukocytosis Respiratory During waking hours and catches himself not breathing, "gasp for air". Saw Dr Wynonia Lawman, he couldn't find a reason Objective Constitutional respirations regular, non-labored and within target range for  patient.. Vitals Time Taken: 12:34 PM, Height: 77 in, Weight: 232 lbs, BMI: 27.5, Temperature: 97.5 F, Pulse: 85 bpm, Respiratory Rate: 16 breaths/min, Blood Pressure: 113/77 mmHg, Capillary Blood Glucose: 160 mg/dl. Cardiovascular 2+ dorsalis pedis/posterior tibialis pulses. General Notes: Left plantar first metatarsal: Callus present although improved from the last clinic visit. There is granulation tissue to the depth of the wound. No signs of infection. Integumentary (Hair, Skin) Wound #2R status is Open. Original cause of wound was Blister. The date acquired was: 06/23/2020. The wound has been in [name] 29 weeks. The wound is located on the Left Metatarsal head first. The wound measures 0.3cm length x 0.4cm width x 0.3cm depth; 0.094cm^2 area and 0.028cm^3 volume. There is Fat Layer (Subcutaneous Tissue) exposed. There is no tunneling or undermining noted. There is a medium amount of serosanguineous drainage noted. The wound margin is well defined and not attached to the wound base. There is large (67-100%) pink, pale granulation within the wound bed. There is no necrotic tissue within the wound bed. 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 The wound is stable. There are no signs of infection. There continues to be callus however this is improved from last clinic visit. I debrided the callus and the wound bed. I was able to get healthy granulation tissue. Patient was using Hydrofera Blue to the wound. We discussed silver collagen as he is not having very much drainage. He was agreeable to this change. I am hoping this will help fill it in. Procedures Wound #2R Pre-procedure diagnosis of Wound #2R is a Diabetic Wound/Ulcer of the Lower  Extremity located on the Left Metatarsal head first .Severity of Tissue Pre Debridement is: Fat layer exposed. There was a Excisional Skin/Subcutaneous Tissue Debridement with a total area of 0.12 sq cm performed by [name] Shan, DO. With the following instrument(s): Curette to remove Viable and Non-Viable tissue/material. Material removed includes Callus, Subcutaneous Tissue, Slough, Skin: Dermis, and Skin: Epidermis after achieving pain control using Lidocaine. No specimens were taken. A time out was conducted at 13:02, prior to the start of the procedure. A Minimum amount of bleeding was controlled with Pressure. The procedure was tolerated well with a pain level of 0 throughout and a pain level of 0 following the procedure. Post Debridement Measurements: 0.3cm length x 0.4cm width x 0.3cm depth; 0.028cm^3 volume. Character of Wound/Ulcer Post Debridement is improved. Severity of Tissue Post Debridement is: Fat layer exposed. Post procedure Diagnosis Wound #2R: Same as Pre-Procedure Plan Follow-up Appointments: Return Appointment in 2 weeks. - Dr. Heber Buckner Bathing/ Shower/ Hygiene: May shower and wash wound with soap and water. - WITH DRESSING CHANGES. Edema Control - Lymphedema / SCD / Other: Elevate legs to the level of the heart or above for 30 minutes daily and/or when sitting, a frequency of: Avoid standing for long periods of time. Off-Loading: Other: - use felt for offloading WOUND #2R: - Metatarsal head first Wound Laterality: Left Cleanser: Soap and Water Every Other Day/7 Days Discharge Instructions: May shower and wash wound with dial antibacterial soap and water prior to dressing change. Cleanser: Wound Cleanser (Generic) Every Other Day/7 Days Discharge Instructions: Cleanse the wound with wound cleanser prior to applying a clean dressing using gauze sponges, not tissue or cotton balls. Peri-Wound Care: Sween Lotion (Moisturizing lotion) Every Other Day/7 Days Discharge  Instructions: Apply moisturizing lotion as directed Prim Dressing: Promogran Prisma Matrix, 4.34 (sq in) (silver collagen) Every Other Day/7 Days ary Discharge Instructions: Moisten collagen with saline or hydrogel Secondary Dressing: Woven Gauze Sponges 2x2 in (Generic) Every Other Day/7 Days Discharge Instructions: Apply over primary dressing as directed. Secondary Dressing: Optifoam Non-Adhesive Dressing, 4x4 in (Generic) Every Other Day/7 Days Discharge Instructions: Apply over primary dressing cut to make foam donut to help offload Secured With: Conforming Stretch Gauze Bandage, Sterile 2x75 (in/in) (Generic) Every Other Day/7 Days Discharge Instructions: Secure with stretch gauze as directed. Secured With: 31M Medipore H Soft Cloth Surgical T ape, 2x2 (in/yd) (Generic) Every Other Day/7 Days Discharge Instructions: Secure dressing with tape as directed. 1. Silver collagen every other day 2. Follow-up in 2 weeks 3. In office sharp debridement Electronic Signature(s) Signed: 03/11/2021 1:55:10 PM By: [name] Shan DO Entered By: [name] [name] on 03/11/2021 13:53:31 -------------------------------------------------------------------------------- HxROS Details Patient Name: Date of Service: [name] [name], [name] 03/11/2021 12:30 PM Medical Record Number: [id number] Patient Account Number: [id number] Date of Birth/Sex: Treating RN: 02/22/1971 (50 y.o. [name] [name] Primary Care  Provider: Ferd [name] Other Clinician: Referring Provider: Treating Provider/Extender: [name] [name]: 31 Information Obtained From Patient Constitutional Symptoms (General Health) Medical History: Past Medical History Notes: falls , leukocytosis Eyes Medical History: Negative for: Cataracts; Glaucoma; Optic Neuritis Ear/Nose/Mouth/Throat Medical History: Negative for: Chronic sinus problems/congestion; Middle ear  problems Hematologic/Lymphatic Medical History: Positive for: Anemia Negative for: Hemophilia; Human Immunodeficiency Virus; Lymphedema; Sickle Cell Disease Respiratory Medical History: Negative for: Aspiration; Asthma; Chronic Obstructive Pulmonary Disease (COPD); Pneumothorax; Sleep Apnea; Tuberculosis Past Medical History Notes: During waking hours and catches himself not breathing, "gasp for air". Saw Dr Wynonia Lawman, he couldn't find a reason Cardiovascular Medical History: Positive for: Hypertension; Peripheral Venous Disease Negative for: Angina; Arrhythmia; Congestive Heart Failure; Coronary Artery Disease; Deep Vein Thrombosis; Hypotension; Myocardial Infarction; Peripheral Arterial Disease; Phlebitis; Vasculitis Gastrointestinal Medical History: Negative for: Cirrhosis ; Colitis; Crohns; Hepatitis A; Hepatitis B; Hepatitis C Endocrine Medical History: Positive for: Type II Diabetes - oral meds Negative for: Type I Diabetes Time with diabetes: 2014 Treated with: Oral agents Blood sugar tested every day: Yes Tested : Blood sugar testing results: Breakfast: 91 Genitourinary Medical History: Negative for: End Stage Renal Disease Immunological Medical History: Negative for: Lupus Erythematosus; Raynauds; Scleroderma Integumentary (Skin) Medical History: Negative for: History of Burn Musculoskeletal Medical History: Negative for: Gout; Rheumatoid Arthritis; Osteoarthritis; Osteomyelitis Neurologic Medical History: Negative for: Dementia; Neuropathy; Quadriplegia; Paraplegia; Seizure Disorder Oncologic Medical History: Negative for: Received Chemotherapy; Received Radiation Psychiatric Medical History: Negative for: Anorexia/bulimia; Confinement Anxiety Immunizations Pneumococcal Vaccine: Received Pneumococcal Vaccination: No Implantable Devices No devices added Family and Social History Cancer: No; Diabetes: Yes - Maternal Grandparents,Mother; Heart Disease: Yes  - Father; Hereditary Spherocytosis: No; Hypertension: Yes - Father; Kidney Disease: No; Lung Disease: No; Seizures: No; Stroke: No; Thyroid Problems: No; Tuberculosis: No; Current every day smoker; Marital Status - Single; Alcohol Use: Never; Drug Use: No History; Caffeine Use: Never; Financial Concerns: No; Food, Clothing or Shelter Needs: No; Support System Lacking: No; Transportation Concerns: No Electronic Signature(s) Signed: 03/11/2021 1:55:10 PM By: [name] Shan DO Signed: 03/28/2021 3:14:58 PM By: [name] Hammock RN Entered By: [name] [name] on 03/11/2021 13:46:04 -------------------------------------------------------------------------------- SuperBill Details Patient Name: Date of Service: [name] [name], [name] 03/11/2021 Medical Record Number: [id number] Patient Account Number: [id number] Date of Birth/Sex: Treating RN: 1970-11-29 (50 y.o. [name] [name] Primary Care Provider: Ferd [name] Other Clinician: Referring Provider: Treating Provider/Extender: [name] [name]: 31 Diagnosis Coding ICD-10 Codes Code Description E11.621 Type 2 diabetes mellitus with foot ulcer L97.522 Non-pressure chronic ulcer of other part of left foot with fat layer exposed E11.42 Type 2 diabetes mellitus with diabetic polyneuropathy Z89.511 Acquired absence of right leg below knee Facility Procedures CPT4 Code: [id number] Description: 46568 - DEB SUBQ TISSUE 20 SQ CM/< ICD-10 Diagnosis Description L97.522 Non-pressure chronic ulcer of other part of left foot with fat layer exposed Modifier: Quantity: 1 Physician Procedures : CPT4 Code Description Modifier [id number] - WC PHYS SUBQ TISS 20 SQ CM ICD-10 Diagnosis Description L97.522 Non-pressure chronic ulcer of other part of left foot with fat layer exposed Quantity: 1 Electronic Signature(s) Signed: 03/11/2021 1:55:10 PM By: [name] Shan DO Entered By: [name] [name] on 03/11/2021  13:54:41

## 2021-03-28 NOTE — Progress Notes (Signed)
SENICA, CRALL ([id number]) Visit Report for 03/25/2021 Chief Complaint Document Details Patient Name: Date of Service: [name] [name] 03/25/2021 12:30 PM Medical Record Number: [id number] Patient Account Number: [id number] Date of Birth/Sex: Treating RN: Apr 20, 1971 (50 y.o. [name], [name] Primary Care Provider: [name] Other Clinician: Referring Provider: Treating Provider/Extender: Delano Metz in Treatment: 31 Information Obtained from: Patient Chief Complaint 04/21/18; patient is here for review of a wound on his plantar left first metatarsal head 08/16/2020; patient is here for a review of the wound on his plantar left first metatarsal head Electronic Signature(s) Signed: 03/25/2021 1:15:14 PM By: Kalman Shan DO Entered By: Kalman Shan on 03/25/2021 13:08:36 -------------------------------------------------------------------------------- Debridement Details Patient Name: Date of Service: [name] [name], [place] 03/25/2021 12:30 PM Medical Record Number: [id number] Patient Account Number: [id number] Date of Birth/Sex: Treating RN: 30-Jan-1971 (50 y.o. [name], [name] Primary Care Provider: [name] Other Clinician: Referring Provider: Treating Provider/Extender: Delano Metz in Treatment: 31 Debridement Performed for Assessment: Wound #2R Left Metatarsal head first Performed By: Physician Kalman Shan, DO Debridement Type: Debridement Severity of Tissue Pre Debridement: Fat layer exposed Level of Consciousness (Pre-procedure): Awake and Alert Pre-procedure Verification/Time Out Yes - 12:43 Taken: Start Time: 12:44 Pain Control: Lidocaine 4% T opical Solution T Area Debrided (L x W): otal 0.5 (cm) x 0.5 (cm) = 0.25 (cm) Tissue and other material debrided: Viable, Non-Viable, Callus, Subcutaneous, Skin: Dermis , Skin: Epidermis, Fibrin/Exudate Level: Skin/Subcutaneous  Tissue Debridement Description: Excisional Instrument: Curette Bleeding: Minimum Hemostasis Achieved: Pressure End Time: 12:47 Procedural Pain: 0 Post Procedural Pain: 0 Response to Treatment: Procedure was tolerated well Level of Consciousness (Post- Awake and Alert procedure): Post Debridement Measurements of Total Wound Length: (cm) 0.3 Width: (cm) 0.3 Depth: (cm) 0.2 Volume: (cm) 0.014 Character of Wound/Ulcer Post Debridement: Improved Severity of Tissue Post Debridement: Fat layer exposed Post Procedure Diagnosis Same as Pre-procedure Electronic Signature(s) Signed: 03/25/2021 1:15:14 PM By: Kalman Shan DO Signed: 03/26/2021 6:54:10 PM By: Deon Pilling Entered By: Deon Pilling on 03/25/2021 12:48:04 -------------------------------------------------------------------------------- HPI Details Patient Name: Date of Service: STRICKLA [name], [name] 03/25/2021 12:30 PM Medical Record Number: [id number] Patient Account Number: [id number] Date of Birth/Sex: Treating RN: 1971/10/05 (50 y.o. [name] Primary Care Provider: [name] Other Clinician: Referring Provider: Treating Provider/Extender: Delano Metz in Treatment: 48 History of Present Illness HPI Description: 04/21/18 ADMISSION This is a 50 year old man who is a type II diabetic. In spite of fact his hemoglobin A1c is actually quite good 5.73 months ago he is at a really difficult time over the last 6-7 months. He developed a rapidly progressive infection in the right foot in November 2018 associated with osteomyelitis and necrotizing fasciitis. 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 The stump required and a revision on 11/24/17. The stump revision was advertised as being secondary to falls. I'm not sure the progressive history here however this area is actually closed over. The patient tells me over the same timeframe he has had wounds on the plantar aspect of his left foot. In  the ED saw Dr. Sharol Given in April of this year. 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 It is really not clear that this patient is been dressing this with anything. He came in with the clinic without any specific dressing on the wound areas using his own tennis shoe. He tells me that he only  ambulates of course to do a pivot transfer. He does not have his prosthesis for the right leg as of yet and he blames Medicaid for this. He does however use the foot to push himself along in his wheelchair at home. The patient has not had formal arterial studies. ABI in our clinic on the left was 1.13. Patient's past medical history includes type 2 diabetes, fracture of the right fibula. I see that he was treated for abscesses on his right buttock and chest in 2016. I did not look at the microbiology of this. 04/28/18; patient comes back in the clinic today with the wound pretty much the same as when he came in here last week. Small opening lots of undermining relatively. He tells [place] that nothing is really been on this for 3 days in spite of the fact that we gave him enough to dress this easily especially such a small wound. He says he lives with his mother, she is not capable of assisting with this he is changing the dressings himself. 05/05/18; much better-looking wound today. Smaller. There is some undermining medially however that's only perhaps 2 mm. No surrounding erythema. We've been using silver collagen 05/12/18; small wound on the first metatarsal head. No undermining no surrounding erythema. We've been using silver collagen he has home health coming out to change 05/20/18 the wound on the first metatarsal head looks better. Covered in surface debris/callus nonviable tissue. Required debridement but post debridement this looks quite good. We've been using silver collagen. He has home health 05/27/18; first metatarsal head wound continues to improve. Just about  completely closed. Still a lot of surface debridement callus. We've been using silver collagen 06/06/18; the first metatarsal wound is completely healed over. There is still a lot of callus. I gently removed some of this just to make sure that there was no open area and there is not. The patient mentions to me that his left leg has felt like "lead" for about a week READMISSION 08/16/2020 This is a 50 year old man with type 2 diabetes. He returns to clinic today with a 1 month history of a blister and callus over the first metatarsal head. This is in exactly the same area as when he was here in 2019. 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 He has standard running shoes on the left foot to match the area in his prosthesis. He has only been covering this area with a Band-Aid has not really been specifically dressing this. ABI in our clinic on the left was 1.16. This is essentially stable from the value in 2019 10/1 the patient has a linear wound over the left first metatarsal head. Again not a lot of callus thick skin around this that I removed with a #3 curette. This is not go deep to bone or muscle it does not look to be infected. 10/8 first metatarsal head. The wound looks like it is closing however this is going to be a difficult area to offload in the future. He has a size 15 shoe and he says his shoes are years old. The inserts in these current shoes are totally useless and I have advised him to replace these. He may need a new pair of shoes and I have he got original prosthesis at [employer] [place] I have asked him to go back there. 11/9; the patient has not been here in more than a month. Not sure  he was healed when he was here the last time. I told him he would have to get new shoes and certainly offload the area over the left first metatarsal head. He has done nothing of the kind. He arrives back in clinic with the same old new  balance sneakers. A very large separating callus over the first metatarsal head on the left 11/16; he has purchased new shoes and has at least 2 insoles like I asked. The wound is measuring much smaller. He is using silver alginate he changes the dressing himself 11/30; wound is measurably smaller in length of about a half a centimeter. This is generally very little depth. We have been using silver alginate. He changes the dressing himself every second day. 12/14; wound is about the same size. Significant undermining medially relative to the size of the wound. We have been using silver alginate. There is not a way to offload this area as he walks with a prosthesis on the right leg 12/28; wound is about the same size. Again he has undermining medially. I am using polymen on this 12/02/2020; the wound looks smaller. Still a senescent edge. We have been using polymen 1/24; the wound is come down a few millimeters. Still a senescent edge. I have been using polymen 2/8; the wound is come down slightly. Still with slight undermining from 12-6 o'clock I have been using polymen partially to get offloading on this area which is opposed by his prosthesis on the other leg. 2/22 quite open improvement he still has a comma shaped wound on the left first metatarsal head. Some depth. Using polymen 3/14; he still has the same problem of a comma shaped area over the left first metatarsal head. This seems to have had more depth today at 0.6 cm. We have been using polymen. The patient changes this himself every second day. I explored the idea of a total contact cast on the left leg however this is his driving foot. He was not enamored with the idea of not being able to drive and states that he does not have anybody else to get him around 3/28; again he comes in with a smaller looking wound however there is depth and undermining requiring debridement. We have been using Hydrofera Blue, changed to silver collagen  today. The patient is doing the dressing himself 4/11; patient presents today for follow-up of his left diabetic foot wound. He denies any issues in the past 2 weeks. He is currently using silver collagen every other day with dressing changes. He does not use diabetic shoes or special inserts to his sneakers. He does not use felt pads for offloading. 4/19; patient presents for his 1 week follow-up. He denies any issues. He reports using Hydrofera Blue and has not been using silver collagen for dressing changes. He reports minimal drainage to the wound. He overall feels well. 5/3; patient presents for 2-week follow-up. He has been using silver collagen without issues. He has no complaints today and states he overall feels well. Electronic Signature(s) Signed: 03/25/2021 1:15:14 PM By: Kalman Shan DO Entered By: Kalman Shan on 03/25/2021 13:09:06 -------------------------------------------------------------------------------- Physical Exam Details Patient Name: Date of Service: STRICKLA [name], [place] 03/25/2021 12:30 PM Medical Record Number: [id number] Patient Account Number: [id number] Date of Birth/Sex: Treating RN: Jan 14, 1971 (50 y.o. [name] Primary Care Provider: [name] Other Clinician: Referring Provider: Treating Provider/Extender: Delano Metz in Treatment: 31 Constitutional respirations regular, non-labored and within target range for patient.. Cardiovascular  2+ dorsalis pedis/posterior tibialis pulses. Psychiatric pleasant and cooperative. Notes Left first metatarsal head: Small opening with circumferential callus. Once callus was removed subcutaneous tissue was debrided and good granulation tissue is present. No acute signs of infection. Electronic Signature(s) Signed: 03/25/2021 1:15:14 PM By: Kalman Shan DO Entered By: Kalman Shan on 03/25/2021  13:09:24 -------------------------------------------------------------------------------- Physician Orders Details Patient Name: Date of Service: STRICKLA [name], [place] 03/25/2021 12:30 PM Medical Record Number: [id number] Patient Account Number: [id number] Date of Birth/Sex: Treating RN: 1971/04/03 (50 y.o. [name], [name] Primary Care Provider: [name] Other Clinician: Referring Provider: Treating Provider/Extender: Delano Metz in Treatment: 19 Verbal / Phone Orders: No Diagnosis Coding ICD-10 Coding Code Description E11.621 Type 2 diabetes mellitus with foot ulcer L97.522 Non-pressure chronic ulcer of other part of left foot with fat layer exposed E11.42 Type 2 diabetes mellitus with diabetic polyneuropathy Z89.511 Acquired absence of right leg below knee Follow-up Appointments ppointment in 2 weeks. - Dr. Heber [place] Return A Bathing/ Shower/ Hygiene May shower and wash wound with soap and water. - WITH DRESSING CHANGES. Edema Control - Lymphedema / SCD / Other Elevate legs to the level of the heart or above for 30 minutes daily and/or when sitting, a frequency of: Avoid standing for long periods of time. Off-Loading Other: - use felt for offloading Wound Treatment Wound #2R - Metatarsal head first Wound Laterality: Left Cleanser: Soap and Water Every Other Day/7 Days Discharge Instructions: May shower and wash wound with dial antibacterial soap and water prior to dressing change. Cleanser: Wound Cleanser (Generic) Every Other Day/7 Days Discharge Instructions: Cleanse the wound with wound cleanser prior to applying a clean dressing using gauze sponges, not tissue or cotton balls. Peri-Wound Care: Sween Lotion (Moisturizing lotion) Every Other Day/7 Days Discharge Instructions: Apply moisturizing lotion as directed Prim Dressing: Promogran Prisma Matrix, 4.34 (sq in) (silver collagen) Every Other Day/7 Days ary Discharge Instructions: Moisten  collagen with saline or hydrogel Secondary Dressing: Woven Gauze Sponges 2x2 in (Generic) Every Other Day/7 Days Discharge Instructions: Apply over primary dressing as directed. Secondary Dressing: Optifoam Non-Adhesive Dressing, 4x4 in (Generic) Every Other Day/7 Days Discharge Instructions: Apply over primary dressing cut to make foam donut to help offload Secured With: Conforming Stretch Gauze Bandage, Sterile 2x75 (in/in) (Generic) Every Other Day/7 Days Discharge Instructions: Secure with stretch gauze as directed. Secured With: 45M Medipore H Soft Cloth Surgical Tape, 2x2 (in/yd) (Generic) Every Other Day/7 Days Discharge Instructions: Secure dressing with tape as directed. Electronic Signature(s) Signed: 03/25/2021 1:15:14 PM By: Kalman Shan DO Entered By: Kalman Shan on 03/25/2021 13:09:36 -------------------------------------------------------------------------------- Problem List Details Patient Name: Date of Service: [name] [name], [place] 03/25/2021 12:30 PM Medical Record Number: [id number] Patient Account Number: [id number] Date of Birth/Sex: Treating RN: [birth year] (50 y.o. [name] Primary Care Provider: [name] Other Clinician: Referring Provider: Treating Provider/Extender: Delano Metz in Treatment: 31 Active Problems ICD-10 Encounter Code Description Active Date MDM Diagnosis E11.621 Type 2 diabetes mellitus with foot ulcer 08/16/2020 No Yes L97.522 Non-pressure chronic ulcer of other part of left foot with fat layer exposed 08/16/2020 No Yes E11.42 Type 2 diabetes mellitus with diabetic polyneuropathy 08/16/2020 No Yes Z89.511 Acquired absence of right leg below knee 08/16/2020 No Yes Inactive Problems Resolved Problems Electronic Signature(s) Signed: 03/25/2021 1:15:14 PM By: Kalman Shan DO Entered By: Kalman Shan on 03/25/2021  13:05:11 -------------------------------------------------------------------------------- Progress Note Details Patient Name: Date of Service: [place] [name], [place] 03/25/2021 12:30 PM Medical  Record Number: [id number] Patient Account Number: [id number] Date of Birth/Sex: Treating RN: August 06, 1971 (50 y.o. [name], [name] Primary Care Provider: [name] Other Clinician: Referring Provider: Treating Provider/Extender: Delano Metz in Treatment: 31 Subjective Chief Complaint Information obtained from Patient 04/21/18; patient is here for review of a wound on his plantar left first metatarsal head 08/16/2020; patient is here for a review of the wound on his plantar left first metatarsal head History of Present Illness (HPI) 04/21/18 ADMISSION This is a 50 year old man who is a type II diabetic. In spite of fact his hemoglobin A1c is actually quite good 5.73 months ago he is at a really difficult time over the last 6-7 months. He developed a rapidly progressive infection in the right foot in November 2018 associated with osteomyelitis and necrotizing fasciitis. 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 The stump required and a revision on 11/24/17. The stump revision was advertised as being secondary to falls. I'm not sure the progressive history here however this area is actually closed over. The patient tells me over the same timeframe he has had wounds on the plantar aspect of his left foot. In the ED saw Dr. Sharol Given in April of this year. 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 It is really not clear that this patient is been dressing this with anything. He came in with the clinic without any specific dressing on the wound areas using his own tennis shoe. He tells me that he only ambulates of course to do a pivot transfer. He does not have his prosthesis for the right leg as of yet and he blames Medicaid for this. He  does however use the foot to push himself along in his wheelchair at home. The patient has not had formal arterial studies. ABI in our clinic on the left was 1.13. Patient's past medical history includes type 2 diabetes, fracture of the right fibula. I see that he was treated for abscesses on his right buttock and chest in 2016. I did not look at the microbiology of this. 04/28/18; patient comes back in the clinic today with the wound pretty much the same as when he came in here last week. Small opening lots of undermining relatively. He tells [place] that nothing is really been on this for 3 days in spite of the fact that we gave him enough to dress this easily especially such a small wound. He says he lives with his mother, she is not capable of assisting with this he is changing the dressings himself. 05/05/18; much better-looking wound today. Smaller. There is some undermining medially however that's only perhaps 2 mm. No surrounding erythema. We've been using silver collagen 05/12/18; small wound on the first metatarsal head. No undermining no surrounding erythema. We've been using silver collagen he has home health coming out to change 05/20/18 the wound on the first metatarsal head looks better. Covered in surface debris/callus nonviable tissue. Required debridement but post debridement this looks quite good. We've been using silver collagen. He has home health 05/27/18; first metatarsal head wound continues to improve. Just about completely closed. Still a lot of surface debridement callus. We've been using silver collagen 06/06/18; the first metatarsal wound is completely healed over. There is still a lot of callus. I gently removed some of this just to make sure that there was no open area and there is not. The patient mentions to me that his left leg  has felt like "lead" for about a week READMISSION 08/16/2020 This is a 50 year old man with type 2 diabetes. He returns to clinic today with a 1 month  history of a blister and callus over the first metatarsal head. This is in exactly the same area as when he was here in 2019. 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 He has standard running shoes on the left foot to match the area in his prosthesis. He has only been covering this area with a Band-Aid has not really been specifically dressing this. ABI in our clinic on the left was 1.16. This is essentially stable from the value in 2019 10/1 the patient has a linear wound over the left first metatarsal head. Again not a lot of callus thick skin around this that I removed with a #3 curette. This is not go deep to bone or muscle it does not look to be infected. 10/8 first metatarsal head. The wound looks like it is closing however this is going to be a difficult area to offload in the future. He has a size 15 shoe and he says his shoes are years old. The inserts in these current shoes are totally useless and I have advised him to replace these. He may need a new pair of shoes and I have he got original prosthesis at [employer] [place] I have asked him to go back there. 11/9; the patient has not been here in more than a month. Not sure he was healed when he was here the last time. I told him he would have to get new shoes and certainly offload the area over the left first metatarsal head. He has done nothing of the kind. He arrives back in clinic with the same old new balance sneakers. A very large separating callus over the first metatarsal head on the left 11/16; he has purchased new shoes and has at least 2 insoles like I asked. The wound is measuring much smaller. He is using silver alginate he changes the dressing himself 11/30; wound is measurably smaller in length of about a half a centimeter. This is generally very little depth. We have been using silver alginate. He changes the dressing himself every second day. 12/14; wound is about the same  size. Significant undermining medially relative to the size of the wound. We have been using silver alginate. There is not a way to offload this area as he walks with a prosthesis on the right leg 12/28; wound is about the same size. Again he has undermining medially. I am using polymen on this 12/02/2020; the wound looks smaller. Still a senescent edge. We have been using polymen 1/24; the wound is come down a few millimeters. Still a senescent edge. I have been using polymen 2/8; the wound is come down slightly. Still with slight undermining from 12-6 o'clock I have been using polymen partially to get offloading on this area which is opposed by his prosthesis on the other leg. 2/22 quite open improvement he still has a comma shaped wound on the left first metatarsal head. Some depth. Using polymen 3/14; he still has the same problem of a comma shaped area over the left first metatarsal head. This seems to have had more depth today at 0.6 cm. We have been using polymen. The patient changes this himself every second day. I explored the idea of a total contact cast on the left leg however  this is his driving foot. He was not enamored with the idea of not being able to drive and states that he does not have anybody else to get him around 3/28; again he comes in with a smaller looking wound however there is depth and undermining requiring debridement. We have been using Hydrofera Blue, changed to silver collagen today. The patient is doing the dressing himself 4/11; patient presents today for follow-up of his left diabetic foot wound. He denies any issues in the past 2 weeks. He is currently using silver collagen every other day with dressing changes. He does not use diabetic shoes or special inserts to his sneakers. He does not use felt pads for offloading. 4/19; patient presents for his 1 week follow-up. He denies any issues. He reports using Hydrofera Blue and has not been using silver collagen for  dressing changes. He reports minimal drainage to the wound. He overall feels well. 5/3; patient presents for 2-week follow-up. He has been using silver collagen without issues. He has no complaints today and states he overall feels well. Patient History Information obtained from Patient. Family History Diabetes - Maternal Grandparents,Mother, Heart Disease - Father, Hypertension - Father, No family history of Cancer, Hereditary Spherocytosis, Kidney Disease, Lung Disease, Seizures, Stroke, Thyroid Problems, Tuberculosis. Social History Current every day smoker, Marital Status - Single, Alcohol Use - Never, Drug Use - No History, Caffeine Use - Never. Medical History Eyes Denies history of Cataracts, Glaucoma, Optic Neuritis Ear/Nose/Mouth/Throat Denies history of Chronic sinus problems/congestion, Middle ear problems Hematologic/Lymphatic Patient has history of Anemia Denies history of Hemophilia, Human Immunodeficiency Virus, Lymphedema, Sickle Cell Disease Respiratory Denies history of Aspiration, Asthma, Chronic Obstructive Pulmonary Disease (COPD), Pneumothorax, Sleep Apnea, Tuberculosis Cardiovascular Patient has history of Hypertension, Peripheral Venous Disease Denies history of Angina, Arrhythmia, Congestive Heart Failure, Coronary Artery Disease, Deep Vein Thrombosis, Hypotension, Myocardial Infarction, Peripheral Arterial Disease, Phlebitis, Vasculitis Gastrointestinal Denies history of Cirrhosis , Colitis, Crohnoos, Hepatitis A, Hepatitis B, Hepatitis C Endocrine Patient has history of Type II Diabetes - oral meds Denies history of Type I Diabetes Genitourinary Denies history of End Stage Renal Disease Immunological Denies history of Lupus Erythematosus, Raynaudoos, Scleroderma Integumentary (Skin) Denies history of History of Burn Musculoskeletal Denies history of Gout, Rheumatoid Arthritis, Osteoarthritis, Osteomyelitis Neurologic Denies history of Dementia,  Neuropathy, Quadriplegia, Paraplegia, Seizure Disorder Oncologic Denies history of Received Chemotherapy, Received Radiation Psychiatric Denies history of Anorexia/bulimia, Confinement Anxiety Medical A Surgical History Notes [name] Constitutional Symptoms (General Health) falls , leukocytosis Respiratory During waking hours and catches himself not breathing, "gasp for air". Saw Dr Wynonia Lawman, he couldn't find a reason Objective Constitutional respirations regular, non-labored and within target range for patient.. Vitals Time Taken: 12:30 PM, Height: 77 in, Weight: 232 lbs, BMI: 27.5, Temperature: 98.8 F, Pulse: 96 bpm, Respiratory Rate: 20 breaths/min, Blood Pressure: 119/77 mmHg, Capillary Blood Glucose: 73 mg/dl. Cardiovascular 2+ dorsalis pedis/posterior tibialis pulses. Psychiatric pleasant and cooperative. General Notes: Left first metatarsal head: Small opening with circumferential callus. Once callus was removed subcutaneous tissue was debrided and good granulation tissue is present. No acute signs of infection. Integumentary (Hair, Skin) Wound #2R status is Open. Original cause of wound was Blister. The date acquired was: 06/23/2020. The wound has been in treatment 31 weeks. The wound is located on the Left Metatarsal head first. The wound measures 0.2cm length x 0.2cm width x 0.2cm depth; 0.031cm^2 area and 0.006cm^3 volume. There is Fat Layer (Subcutaneous Tissue) exposed. There is no tunneling noted, however, there is undermining  starting at 12:00 and ending at 12:00 with a maximum distance of 0.2cm. There is a medium amount of serosanguineous drainage noted. The wound margin is well defined and not attached to the wound base. There is large (67-100%) pink, pale granulation within the wound bed. There is no necrotic tissue within the wound bed. General Notes: callous periwound. 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 There was callus and this was removed. There was good granulation tissue present postdebridement. I recommended he continue to use silver collagen. We discussed continued aggressive offloading. He would like to follow-up in 2 weeks. Procedures Wound #2R Pre-procedure diagnosis of Wound #2R is a Diabetic Wound/Ulcer of the Lower Extremity located on the Left Metatarsal head first .Severity of Tissue Pre Debridement is: Fat layer exposed. There was a Excisional Skin/Subcutaneous Tissue Debridement with a total area of 0.25 sq cm performed by Kalman Shan, DO. With the following instrument(s): Curette to remove Viable and Non-Viable tissue/material. Material removed includes Callus, Subcutaneous Tissue, Skin: Dermis, Skin: Epidermis, and Fibrin/Exudate after achieving pain control using Lidocaine 4% Topical Solution. A time out was conducted at 12:43, prior to the start of the procedure. A Minimum amount of bleeding was controlled with Pressure. The procedure was tolerated well with a pain level of 0 throughout and a pain level of 0 following the procedure. Post Debridement Measurements: 0.3cm length x 0.3cm width x 0.2cm depth; 0.014cm^3 volume. Character of Wound/Ulcer Post Debridement is improved. Severity of Tissue Post Debridement is: Fat layer exposed. Post procedure Diagnosis Wound #2R: Same as Pre-Procedure Plan Follow-up Appointments: Return Appointment in 2 weeks. - Dr. Heber Bellwood Bathing/ Shower/ Hygiene: May shower and wash wound with soap and water. - WITH DRESSING CHANGES. Edema Control - Lymphedema / SCD / Other: Elevate legs to the level of the heart or above for 30 minutes daily and/or when sitting, a frequency of: Avoid standing for long periods of  time. Off-Loading: Other: - use felt for offloading WOUND #2R: - Metatarsal head first Wound Laterality: Left Cleanser: Soap and Water Every Other Day/7 Days Discharge Instructions: May shower and wash wound with dial antibacterial soap and water prior to dressing change. Cleanser: Wound Cleanser (Generic) Every Other Day/7 Days Discharge Instructions: Cleanse the wound with wound cleanser prior to applying a clean dressing using gauze sponges, not tissue or cotton balls. Peri-Wound Care: Sween Lotion (Moisturizing lotion) Every Other Day/7 Days Discharge Instructions: Apply moisturizing lotion as directed Prim Dressing: Promogran Prisma Matrix, 4.34 (sq in) (silver collagen) Every Other Day/7 Days ary Discharge Instructions: Moisten collagen with saline or hydrogel Secondary Dressing: Woven Gauze Sponges 2x2 in (Generic) Every Other Day/7 Days Discharge Instructions: Apply over primary dressing as directed. Secondary Dressing: Optifoam Non-Adhesive Dressing, 4x4 in (Generic) Every Other Day/7 Days Discharge Instructions: Apply over primary dressing cut to make foam donut to help offload Secured With: Conforming Stretch Gauze Bandage, Sterile 2x75 (in/in) (Generic) Every Other Day/7 Days Discharge Instructions: Secure with stretch gauze as directed. Secured With: 33M Medipore H Soft Cloth Surgical T ape, 2x2 (in/yd) (Generic) Every Other Day/7 Days Discharge Instructions: Secure dressing with tape as directed. 1. Silver collagen every other day 2. Follow-up in 2 weeks 3. Aggressive offloading Electronic Signature(s) Signed: 03/25/2021 1:15:14 PM By: Heber Estelline,  Janett Billow DO Entered By: Kalman Shan on 03/25/2021 13:13:25 -------------------------------------------------------------------------------- HxROS Details Patient Name: Date of Service: STRICKLA [name], Waller 03/25/2021 12:30 PM Medical Record Number: [id number] Patient Account Number: [id number] Date of Birth/Sex: Treating  RN: 1971-04-03 (50 y.o. [name] Primary Care Provider: [name] Other Clinician: Referring Provider: Treating Provider/Extender: Delano Metz in Treatment: 31 Information Obtained From Patient Constitutional Symptoms (General Health) Medical History: Past Medical History Notes: falls , leukocytosis Eyes Medical History: Negative for: Cataracts; Glaucoma; Optic Neuritis Ear/Nose/Mouth/Throat Medical History: Negative for: Chronic sinus problems/congestion; Middle ear problems Hematologic/Lymphatic Medical History: Positive for: Anemia Negative for: Hemophilia; Human Immunodeficiency Virus; Lymphedema; Sickle Cell Disease Respiratory Medical History: Negative for: Aspiration; Asthma; Chronic Obstructive Pulmonary Disease (COPD); Pneumothorax; Sleep Apnea; Tuberculosis Past Medical History Notes: During waking hours and catches himself not breathing, "gasp for air". Saw Dr Wynonia Lawman, he couldn't find a reason Cardiovascular Medical History: Positive for: Hypertension; Peripheral Venous Disease Negative for: Angina; Arrhythmia; Congestive Heart Failure; Coronary Artery Disease; Deep Vein Thrombosis; Hypotension; Myocardial Infarction; Peripheral Arterial Disease; Phlebitis; Vasculitis Gastrointestinal Medical History: Negative for: Cirrhosis ; Colitis; Crohns; Hepatitis A; Hepatitis B; Hepatitis C Endocrine Medical History: Positive for: Type II Diabetes - oral meds Negative for: Type I Diabetes Time with diabetes: 2014 Treated with: Oral agents Blood sugar tested every day: Yes Tested : Blood sugar testing results: Breakfast: 91 Genitourinary Medical History: Negative for: End Stage Renal Disease Immunological Medical History: Negative for: Lupus Erythematosus; Raynauds; Scleroderma Integumentary (Skin) Medical History: Negative for: History of Burn Musculoskeletal Medical History: Negative for: Gout; Rheumatoid  Arthritis; Osteoarthritis; Osteomyelitis Neurologic Medical History: Negative for: Dementia; Neuropathy; Quadriplegia; Paraplegia; Seizure Disorder Oncologic Medical History: Negative for: Received Chemotherapy; Received Radiation Psychiatric Medical History: Negative for: Anorexia/bulimia; Confinement Anxiety Immunizations Pneumococcal Vaccine: Received Pneumococcal Vaccination: No Implantable Devices No devices added Family and Social History Cancer: No; Diabetes: Yes - Maternal Grandparents,Mother; Heart Disease: Yes - Father; Hereditary Spherocytosis: No; Hypertension: Yes - Father; Kidney Disease: No; Lung Disease: No; Seizures: No; Stroke: No; Thyroid Problems: No; Tuberculosis: No; Current every day smoker; Marital Status - Single; Alcohol Use: Never; Drug Use: No History; Caffeine Use: Never; Financial Concerns: No; Food, Clothing or Shelter Needs: No; Support System Lacking: No; Transportation Concerns: No Electronic Signature(s) Signed: 03/25/2021 1:15:14 PM By: Kalman Shan DO Signed: 03/28/2021 3:14:58 PM By: Rhae Hammock RN Entered By: Kalman Shan on 03/25/2021 13:09:14 -------------------------------------------------------------------------------- SuperBill Details Patient Name: Date of Service: [name] [name], Huson 03/25/2021 Medical Record Number: [id number] Patient Account Number: [id number] Date of Birth/Sex: Treating RN: May 04, 1971 (51 y.o. [name], [name] Primary Care Provider: [name] Other Clinician: Referring Provider: Treating Provider/Extender: Delano Metz in Treatment: 31 Diagnosis Coding ICD-10 Codes Code Description E11.621 Type 2 diabetes mellitus with foot ulcer L97.522 Non-pressure chronic ulcer of other part of left foot with fat layer exposed E11.42 Type 2 diabetes mellitus with diabetic polyneuropathy Z89.511 Acquired absence of right leg below knee Facility Procedures CPT4 Code:  [id number] Description: 29798 - DEB SUBQ TISSUE 20 SQ CM/< ICD-10 Diagnosis Description L97.522 Non-pressure chronic ulcer of 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

## 2021-03-28 NOTE — Progress Notes (Signed)
ALEXSIS, BRANSCOM ([id number]) Visit Report for 03/11/2021 Arrival Information Details Patient Name: Date of Service: [name] [name] 03/11/2021 12:30 PM Medical Record Number: [id number] Patient Account Number: [id number] Date of Birth/Sex: Treating RN: 1971-08-15 (50 y.o. [name] [name], [name] [name] Primary Care Sincere Berlanga: [name] Other Clinician: Referring Reena Borromeo: Treating Michai Dieppa/Extender: Delano Metz in Treatment: 29 Visit Information History Since Last Visit Added or deleted any medications: No Patient Arrived: Ambulatory Any new allergies or adverse reactions: No Arrival Time: 12:33 Had a fall or experienced change in No Accompanied By: self activities of daily living that may affect Transfer Assistance: None risk of falls: Patient Identification Verified: Yes Signs or symptoms of abuse/neglect since last visito No Secondary Verification Process Completed: Yes Hospitalized since last visit: No Patient Requires Transmission-Based Precautions: No Implantable device outside of the clinic excluding No Patient Has Alerts: No cellular tissue based products placed in the center since last visit: Has Dressing in Place as Prescribed: Yes Pain Present Now: Yes Electronic Signature(s) Signed: 03/11/2021 3:47:28 PM By: Sandre Kitty Entered By: Sandre Kitty on 03/11/2021 12:33:59 -------------------------------------------------------------------------------- Encounter Discharge Information Details Patient Name: Date of Service: [name] [name], [name] 03/11/2021 12:30 PM Medical Record Number: [id number] Patient Account Number: [id number] Date of Birth/Sex: Treating RN: 1971-08-30 (50 y.o. [name] [name] Primary Care Juanito Gonyer: [name] Other Clinician: Referring Jah Alarid: Treating Ainsley Sanguinetti/Extender: Delano Metz in Treatment: 29 Encounter Discharge Information Items Post Procedure Vitals Discharge  Condition: Stable Temperature (F): 97.5 Ambulatory Status: Ambulatory Pulse (bpm): 85 Discharge Destination: Home Respiratory Rate (breaths/min): 16 Transportation: Private Auto Blood Pressure (mmHg): 113/77 Schedule Follow-up Appointment: Yes Clinical Summary of Care: Provided on 03/11/2021 Form Type Recipient Paper Patient Patient Electronic Signature(s) Signed: 03/11/2021 1:08:52 PM By: Lorrin Jackson Entered By: Lorrin Jackson on 03/11/2021 13:08:52 -------------------------------------------------------------------------------- Lower Extremity Assessment Details Patient Name: Date of Service: [name] [name], [name] 03/11/2021 12:30 PM Medical Record Number: [id number] Patient Account Number: [id number] Date of Birth/Sex: Treating RN: [birth year] (50 y.o. [name] [name] Primary Care Earsie Humm: [name] Other Clinician: Referring Raven Furnas: Treating Jasmaine Rochel/Extender: Delano Metz in Treatment: 29 Edema Assessment Assessed: [name]: Yes] [name]: No] Edema: [Left: N] [Right: o] Calf Left: Right: Point of Measurement: 50 cm From Medial Instep 38.5 cm Ankle Left: Right: Point of Measurement: 11 cm From Medial Instep 25 cm Vascular Assessment Pulses: Dorsalis Pedis Palpable: [Left:Yes] Electronic Signature(s) Signed: 03/11/2021 4:57:15 PM By: Lorrin Jackson Entered By: Lorrin Jackson on 03/11/2021 12:49:11 -------------------------------------------------------------------------------- Multi Wound Chart Details Patient Name: Date of Service: [name] [name], [name] 03/11/2021 12:30 PM Medical Record Number: [id number] Patient Account Number: [id number] Date of Birth/Sex: Treating RN: November 07, 1971 (50 y.o. [name] [name], [name] [name] Primary Care Darrell Hauk: [name] Other Clinician: Referring Dillon Mcreynolds: Treating Tyauna Lacaze/Extender: Delano Metz in Treatment: 29 Vital Signs Height(in): 77 Capillary Blood  Glucose(mg/dl): 160 Weight(lbs): 232 Pulse(bpm): 90 Body Mass Index(BMI): 28 Blood Pressure(mmHg): 113/77 Temperature(F): 97.5 Respiratory Rate(breaths/min): 16 Photos: [2R:No Photos Left Metatarsal head first] [N/A:N/A N/A] Wound Location: [2R:Blister] [N/A:N/A] Wounding Event: [2R:Diabetic Wound/Ulcer of the Lower] [N/A:N/A] Primary Etiology: [2R:Extremity Anemia, Hypertension, Peripheral] [N/A:N/A] Comorbid History: [2R:Venous Disease, Type II Diabetes 06/23/2020] [N/A:N/A] Date Acquired: [2R:29] [N/A:N/A] Weeks of Treatment: [2R:Open] [N/A:N/A] Wound Status: [2R:Yes] [N/A:N/A] Wound Recurrence: [2R:0.3x0.4x0.3] [N/A:N/A] Measurements L x W x D (cm) [2R:0.094] [N/A:N/A] A (cm) : rea [2R:0.028] [N/A:N/A] Volume (cm) : [2R:-203.20%] [N/A:N/A] % Reduction in A rea: [2R:-833.30%] [N/A:N/A] % Reduction in Volume: [2R:Grade 2] [N/A:N/A] Classification: [2R:Medium] [  N/A:N/A] Exudate A mount: [2R:Serosanguineous] [N/A:N/A] Exudate Type: [2R:red, brown] [N/A:N/A] Exudate Color: [2R:Well defined, not attached] [N/A:N/A] Wound Margin: [2R:Large (67-100%)] [N/A:N/A] Granulation A mount: [2R:Pink, Pale] [N/A:N/A] Granulation Quality: [2R:None Present (0%)] [N/A:N/A] Necrotic A mount: [2R:Fat Layer (Subcutaneous Tissue): Yes N/A] Exposed Structures: [2R:Fascia: No Tendon: No Muscle: No Joint: No Bone: No Small (1-33%)] [N/A:N/A] Epithelialization: [2R:Debridement - Excisional] [N/A:N/A] Debridement: Pre-procedure Verification/Time Out 13:02 [N/A:N/A] Taken: [2R:Lidocaine] [N/A:N/A] Pain Control: [2R:Callus, Subcutaneous, Slough] [N/A:N/A] Tissue Debrided: [2R:Skin/Subcutaneous Tissue] [N/A:N/A] Level: [2R:0.12] [N/A:N/A] Debridement A (sq cm): [2R:rea Curette] [N/A:N/A] Instrument: [2R:Minimum] [N/A:N/A] Bleeding: [2R:Pressure] [N/A:N/A] Hemostasis A chieved: [2R:0] [N/A:N/A] Procedural Pain: [2R:0] [N/A:N/A] Post Procedural Pain: [2R:Procedure was tolerated well]  [N/A:N/A] Debridement Treatment Response: [2R:0.3x0.4x0.3] [N/A:N/A] Post Debridement Measurements L x W x D (cm) [2R:0.028] [N/A:N/A] Post Debridement Volume: (cm) [2R:Debridement] [N/A:N/A] Treatment Notes Wound #2R (Metatarsal head first) Wound Laterality: Left Cleanser Soap and Water Discharge Instruction: May shower and wash wound with dial antibacterial soap and water prior to dressing change. Wound Cleanser Discharge Instruction: Cleanse the wound with wound cleanser prior to applying a clean dressing using gauze sponges, not tissue or cotton balls. Peri-Wound Care Sween Lotion (Moisturizing lotion) Discharge Instruction: Apply moisturizing lotion as directed Topical Primary Dressing Promogran Prisma Matrix, 4.34 (sq in) (silver collagen) Discharge Instruction: Moisten collagen with saline or hydrogel Secondary Dressing Woven Gauze Sponges 2x2 in Discharge Instruction: Apply over primary dressing as directed. Optifoam Non-Adhesive Dressing, 4x4 in Discharge Instruction: Apply over primary dressing cut to make foam donut to help offload Secured With Conforming Stretch Gauze Bandage, Sterile 2x75 (in/in) Discharge Instruction: Secure with stretch gauze as directed. 20M Medipore H Soft Cloth Surgical T ape, 2x2 (in/yd) Discharge Instruction: Secure dressing with tape as directed. Compression Wrap Compression Stockings Add-Ons Electronic Signature(s) Signed: 03/11/2021 1:55:10 PM By: Kalman Shan DO Signed: 03/28/2021 3:14:58 PM By: Rhae Hammock RN Entered By: Kalman Shan on 03/11/2021 13:44:50 -------------------------------------------------------------------------------- Multi-Disciplinary Care Plan Details Patient Name: Date of Service: [name] [name], [name] 03/11/2021 12:30 PM Medical Record Number: [id number] Patient Account Number: [id number] Date of Birth/Sex: Treating RN: 1971/06/18 (50 y.o. [name] [name], [name] [name] Primary Care Majid Mccravy: [name] Other Clinician: Referring Josiephine Simao: Treating Zelda Reames/Extender: Delano Metz in Treatment: [place] reviewed with physician Active Inactive Wound/Skin Impairment Nursing Diagnoses: Impaired tissue integrity Goals: Patient/caregiver will verbalize understanding of skin care regimen Date Initiated: 11/19/2020 Target Resolution Date: 04/11/2021 Goal Status: Active Ulcer/skin breakdown will have a volume reduction of 30% by week 4 Date Initiated: 08/16/2020 Date Inactivated: 03/03/2021 Target Resolution Date: 03/10/2021 Goal Status: Met Interventions: Provide education on ulcer and skin care Notes: Electronic Signature(s) Signed: 03/28/2021 3:14:58 PM By: Rhae Hammock RN Entered By: Rhae Hammock on 03/11/2021 12:49:04 -------------------------------------------------------------------------------- Pain Assessment Details Patient Name: Date of Service: [name] [name], [name] 03/11/2021 12:30 PM Medical Record Number: [id number] Patient Account Number: [id number] Date of Birth/Sex: Treating RN: 08-20-1971 (50 y.o. [name] [name] Primary Care Gabrian Hoque: [name] Other Clinician: Referring Giavanna Kang: Treating Oryn Casanova/Extender: Delano Metz in Treatment: 29 Active Problems Location of Pain Severity and Description of Pain Patient Has Paino Yes Site Locations Rate the pain. Rate the pain. Current Pain Level: 5 Pain Management and Medication Current Pain Management: Electronic Signature(s) Signed: 03/11/2021 3:47:28 PM By: Sandre Kitty Signed: 03/28/2021 3:14:58 PM By: Rhae Hammock RN Entered By: Sandre Kitty on 03/11/2021 12:34:36 -------------------------------------------------------------------------------- Patient/Caregiver Education Details Patient Name: Date of Service: [name] [name] 4/19/2022andnbsp12:30 PM Medical Record Number: [id number]  Patient  Account Number: [id number] Date of Birth/Gender: Treating RN: 10-20-1971 (50 y.o. [name] [name] Primary Care Physician: [name] Other Clinician: Referring Physician: Treating Physician/Extender: Delano Metz in Treatment: 29 Education Assessment Education Provided To: Patient Education Topics Provided Wound/Skin Impairment: Methods: Explain/Verbal Responses: State content correctly [employer]) Signed: 03/28/2021 3:14:58 PM By: Rhae Hammock RN Entered By: Rhae Hammock on 03/11/2021 12:49:23 -------------------------------------------------------------------------------- Wound Assessment Details Patient Name: Date of Service: [name] [name], [name] 03/11/2021 12:30 PM Medical Record Number: [id number] Patient Account Number: [id number] Date of Birth/Sex: Treating RN: May 03, 1971 (50 y.o. [name] [name], [name] [name] Primary Care Aparna Vanderweele: [name] Other Clinician: Referring Mette Southgate: Treating Jessice Madill/Extender: Delano Metz in Treatment: 29 Wound Status Wound Number: 2R Primary Diabetic Wound/Ulcer of the Lower Extremity Etiology: Wound Location: Left Metatarsal head first Wound Status: Open Wounding Event: Blister Comorbid Anemia, Hypertension, Peripheral Venous Disease, Type II Date Acquired: 06/23/2020 History: Diabetes Weeks Of Treatment: 29 Clustered Wound: No Photos Wound Measurements Length: (cm) 0.3 Width: (cm) 0.4 Depth: (cm) 0.3 Area: (cm) 0.094 Volume: (cm) 0.028 % Reduction in Area: -203.2% % Reduction in Volume: -833.3% Epithelialization: Small (1-33%) Tunneling: No Undermining: No Wound Description Classification: Grade 2 Wound Margin: Well defined, not attached Exudate Amount: Medium Exudate Type: Serosanguineous Exudate Color: red, brown Foul Odor After Cleansing: No Slough/Fibrino No Wound Bed Granulation Amount: Large (67-100%) Exposed  Structure Granulation Quality: Pink, Pale Fascia Exposed: No Necrotic Amount: None Present (0%) Fat Layer (Subcutaneous Tissue) Exposed: Yes Tendon Exposed: No Muscle Exposed: No Joint Exposed: No Bone Exposed: No Electronic Signature(s) Signed: 03/12/2021 10:33:47 AM By: Sandre Kitty Signed: 03/28/2021 3:14:58 PM By: Rhae Hammock RN Entered By: Sandre Kitty on 03/11/2021 16:37:31 -------------------------------------------------------------------------------- Vitals Details Patient Name: Date of Service: [name] [name], [name] 03/11/2021 12:30 PM Medical Record Number: [id number] Patient Account Number: [id number] Date of Birth/Sex: Treating RN: [birth year] (50 y.o. [name] [name], [name] [name] Primary Care Hildur Bayer: [name] Other Clinician: Referring Mauriah Mcmillen: Treating Genine Beckett/Extender: Delano Metz in Treatment: 29 Vital Signs Time Taken: 12:34 Temperature (F): 97.5 Height (in): 77 Pulse (bpm): 85 Weight (lbs): 232 Respiratory Rate (breaths/min): 16 Body Mass Index (BMI): 27.5 Blood Pressure (mmHg): 113/77 Capillary Blood Glucose (mg/dl): 160 Reference Range: 80 - 120 mg / dl Electronic Signature(s) Signed: 03/11/2021 3:47:28 PM By: Sandre Kitty Entered By: Sandre Kitty on 03/11/2021 12:34:27

## 2021-04-08 ENCOUNTER — Encounter (HOSPITAL_BASED_OUTPATIENT_CLINIC_OR_DEPARTMENT_OTHER): Payer: Medicare Other | Admitting: Internal Medicine

## 2021-04-08 ENCOUNTER — Other Ambulatory Visit: Payer: Self-pay

## 2021-04-08 DIAGNOSIS — S81801A Unspecified open wound, right lower leg, initial encounter: Secondary | ICD-10-CM | POA: Diagnosis not present

## 2021-04-08 DIAGNOSIS — L97522 Non-pressure chronic ulcer of other part of left foot with fat layer exposed: Secondary | ICD-10-CM

## 2021-04-08 DIAGNOSIS — Z89511 Acquired absence of right leg below knee: Secondary | ICD-10-CM

## 2021-04-08 DIAGNOSIS — A419 Sepsis, unspecified organism: Secondary | ICD-10-CM | POA: Diagnosis not present

## 2021-04-08 DIAGNOSIS — L0390 Cellulitis, unspecified: Secondary | ICD-10-CM | POA: Diagnosis not present

## 2021-04-08 NOTE — Progress Notes (Signed)
[name] [name] ([id number]) Visit Report for 04/08/2021 Chief Complaint Document Details Patient Name: Date of Service: [name] [name] 04/08/2021 12:30 PM Medical Record Number: [id number] Patient Account Number: [id number] Date of Birth/Sex: Treating RN: Aug 07, 1971 (50 y.o. [name] [name], [name] [name] Primary Care Provider: [name] Other Clinician: Referring Provider: Treating Provider/Extender: Delano Metz in Treatment: 30 Information Obtained from: Patient Chief Complaint patient is here for a review of the wound on his plantar left first metatarsal head and back of his right leg Electronic Signature(s) Signed: 04/08/2021 1:39:31 PM By: Kalman Shan DO Entered By: Kalman Shan on 04/08/2021 13:23:08 -------------------------------------------------------------------------------- Debridement Details Patient Name: Date of Service: [name] [name] ND, [name] 04/08/2021 12:30 PM Medical Record Number: [id number] Patient Account Number: [id number] Date of Birth/Sex: Treating RN: September 07, 1971 (50 y.o. [name] [name], [name] [name] Primary Care Provider: [name] Other Clinician: Referring Provider: Treating Provider/Extender: Delano Metz in Treatment: 33 Debridement Performed for Assessment: Wound #2R Left Metatarsal head first Performed By: Physician Kalman Shan, DO Debridement Type: Debridement Severity of Tissue Pre Debridement: Fat layer exposed Level of Consciousness (Pre-procedure): Awake and Alert Pre-procedure Verification/Time Out Yes - 13:04 Taken: Start Time: 13:04 Pain Control: Lidocaine T Area Debrided (L x W): otal 0.3 (cm) x 0.2 (cm) = 0.06 (cm) Tissue and other material debrided: Viable, Non-Viable, Callus, Slough, Subcutaneous, Skin: Dermis , Skin: Epidermis, Slough Level: Skin/Subcutaneous Tissue Debridement Description: Excisional Instrument: Curette Bleeding: Minimum Hemostasis Achieved:  Pressure End Time: 13:08 Procedural Pain: 0 Post Procedural Pain: 0 Response to Treatment: Procedure was tolerated well Level of Consciousness (Post- Awake and Alert procedure): Post Debridement Measurements of Total Wound Length: (cm) 0.3 Width: (cm) 0.2 Depth: (cm) 0.2 Volume: (cm) 0.009 Character of Wound/Ulcer Post Debridement: Improved Severity of Tissue Post Debridement: Fat layer exposed Post Procedure Diagnosis Same as Pre-procedure Electronic Signature(s) Signed: 04/08/2021 1:39:31 PM By: Kalman Shan DO Signed: 04/08/2021 5:09:03 PM By: Rhae Hammock RN Entered By: Rhae Hammock on 04/08/2021 13:08:37 -------------------------------------------------------------------------------- HPI Details Patient Name: Date of Service: [name] ND, [name] 04/08/2021 12:30 PM Medical Record Number: [id number] Patient Account Number: [id number] Date of Birth/Sex: Treating RN: August 09, 1971 (50 y.o. [name] [name] Primary Care Provider: [name] Other Clinician: Referring Provider: Treating Provider/Extender: Delano Metz in Treatment: 26 History of Present Illness HPI Description: 04/21/18 ADMISSION This is a 50 year old man who is a type II diabetic. In spite of fact his hemoglobin A1c is actually quite good 5.73 months ago he is at a really difficult time over the last 6-7 months. He developed a rapidly progressive infection in the right foot in November 2018 associated with osteomyelitis and necrotizing fasciitis. 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 The stump required and a revision on 11/24/17. The stump revision was advertised as being secondary to falls. I'm not sure the progressive history here however this area is actually closed over. The patient tells me over the same timeframe he has had wounds on the plantar aspect of his left foot. In the ED saw Dr. Sharol Given in April of this year. Noted that have Wagner grade 1 diabetic ulcers on  the fifth didn't first metatarsal heads. It is really not clear that this patient is been dressing this with anything. He came in with the clinic without any specific dressing on the wound areas using his own tennis shoe. He tells me that he only ambulates of course to do a pivot transfer. He does not have his  prosthesis for the right leg as of yet and he blames Medicaid for this. He does however use the foot to push himself along in his wheelchair at home. The patient has not had formal arterial studies. ABI in our clinic on the left was 1.13. Patient's past medical history includes type 2 diabetes, fracture of the right fibula. I see that he was treated for abscesses on his right buttock and chest in 2016. I did not look at the microbiology of this. 04/28/18; patient comes back in the clinic today with the wound pretty much the same as when he came in here last week. Small opening lots of undermining relatively. He tells [place] that nothing is really been on this for 3 days in spite of the fact that we gave him enough to dress this easily especially such a small wound. He says he lives with his mother, she is not capable of assisting with this he is changing the dressings himself. 05/05/18; much better-looking wound today. Smaller. There is some undermining medially however that's only perhaps 2 mm. No surrounding erythema. We've been using silver collagen 05/12/18; small wound on the first metatarsal head. No undermining no surrounding erythema. We've been using silver collagen he has home health coming out to change 05/20/18 the wound on the first metatarsal head looks better. Covered in surface debris/callus nonviable tissue. Required debridement but post debridement this looks quite good. We've been using silver collagen. He has home health 05/27/18; first metatarsal head wound continues to improve. Just about completely closed. Still a lot of surface debridement callus. We've been using  silver collagen 06/06/18; the first metatarsal wound is completely healed over. There is still a lot of callus. I gently removed some of this just to make sure that there was no open area and there is not. The patient mentions to me that his left leg has felt like "lead" for about a week READMISSION 08/16/2020 This is a 50 year old man with type 2 diabetes. He returns to clinic today with a 1 month history of a blister and callus over the first metatarsal head. This is in exactly the same area as when he was here in 2019. 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 He has standard running shoes on the left foot to match the area in his prosthesis. He has only been covering this area with a Band-Aid has not really been specifically dressing this. ABI in our clinic on the left was 1.16. This is essentially stable from the value in 2019 10/1 the patient has a linear wound over the left first metatarsal head. Again not a lot of callus thick skin around this that I removed with a #3 curette. This is not go deep to bone or muscle it does not look to be infected. 10/8 first metatarsal head. The wound looks like it is closing however this is going to be a difficult area to offload in the future. He has a size 15 shoe and he says his shoes are years old. The inserts in these current shoes are totally useless and I have advised him to replace these. He may need a new pair of shoes and I have he got original prosthesis at [employer] [place] I have asked him to go back there. 11/9; the patient has not been here in more than a month. Not sure he was healed when he was here the last time. I told him  he would have to get new shoes and certainly offload the area over the left first metatarsal head. He has done nothing of the kind. He arrives back in clinic with the same old new balance sneakers. A very large separating callus over the first metatarsal head on the  left 11/16; he has purchased new shoes and has at least 2 insoles like I asked. The wound is measuring much smaller. He is using silver alginate he changes the dressing himself 11/30; wound is measurably smaller in length of about a half a centimeter. This is generally very little depth. We have been using silver alginate. He changes the dressing himself every second day. 12/14; wound is about the same size. Significant undermining medially relative to the size of the wound. We have been using silver alginate. There is not a way to offload this area as he walks with a prosthesis on the right leg 12/28; wound is about the same size. Again he has undermining medially. I am using polymen on this 12/02/2020; the wound looks smaller. Still a senescent edge. We have been using polymen 1/24; the wound is come down a few millimeters. Still a senescent edge. I have been using polymen 2/8; the wound is come down slightly. Still with slight undermining from 12-6 o'clock I have been using polymen partially to get offloading on this area which is opposed by his prosthesis on the other leg. 2/22 quite open improvement he still has a comma shaped wound on the left first metatarsal head. Some depth. Using polymen 3/14; he still has the same problem of a comma shaped area over the left first metatarsal head. This seems to have had more depth today at 0.6 cm. We have been using polymen. The patient changes this himself every second day. I explored the idea of a total contact cast on the left leg however this is his driving foot. He was not enamored with the idea of not being able to drive and states that he does not have anybody else to get him around 3/28; again he comes in with a smaller looking wound however there is depth and undermining requiring debridement. We have been using Hydrofera Blue, changed to silver collagen today. The patient is doing the dressing himself 4/11; patient presents today for follow-up  of his left diabetic foot wound. He denies any issues in the past 2 weeks. He is currently using silver collagen every other day with dressing changes. He does not use diabetic shoes or special inserts to his sneakers. He does not use felt pads for offloading. 4/19; patient presents for his 1 week follow-up. He denies any issues. He reports using Hydrofera Blue and has not been using silver collagen for dressing changes. He reports minimal drainage to the wound. He overall feels well. 5/3; patient presents for 2-week follow-up. He has been using silver collagen without issues. He has no complaints today and states he overall feels well. 5/17; patient presents for 2-week follow-up. He states that the sleeve is rubbing up against the back of his right leg and causing a wound. He states this happened a week ago and has not been dressing the wound. The plantar wound is stable and he has been using collagen every other day. Electronic Signature(s) Signed: 04/08/2021 1:39:31 PM By: Kalman Shan DO Entered By: Kalman Shan on 04/08/2021 13:28:01 -------------------------------------------------------------------------------- Physical Exam Details Patient Name: Date of Service: [name] ND, [place] 04/08/2021 12:30 PM Medical Record Number: [id number] Patient Account Number: [id number] Date  of Birth/Sex: Treating RN: 08/09/1971 (50 y.o. [name] [name] Primary Care Provider: [name] Other Clinician: Referring Provider: Treating Provider/Extender: Delano Metz in Treatment: 44 Constitutional respirations regular, non-labored and within target range for patient.Marland Kitchen Psychiatric pleasant and cooperative. Notes Left first metatarsal head: Small opening with circumferential callus And nonviable tissue. Once callus was removed subcutaneous tissue was debrided and good granulation tissue is present. No acute signs of infection. Posterior right knee: 2 wounds  limited to skin breakdown. Right leg has scattered red areas of dermatitis similar to fungal infection Electronic Signature(s) Signed: 04/08/2021 1:39:31 PM By: Kalman Shan DO Entered By: Kalman Shan on 04/08/2021 13:29:46 -------------------------------------------------------------------------------- Physician Orders Details Patient Name: Date of Service: [name] ND, [name] 04/08/2021 12:30 PM Medical Record Number: [id number] Patient Account Number: [id number] Date of Birth/Sex: Treating RN: 09/04/1971 (50 y.o. 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 - dr Heber [place]!!!! Return A Bathing/ Shower/ Hygiene May shower and wash wound with soap and water. - WITH DRESSING CHANGES. Edema Control - Lymphedema / SCD / Other Elevate legs to the level of the heart or above for 30 minutes daily and/or when sitting, a frequency of: Avoid standing for long periods of time. Off-Loading Other: - use felt for offloading Additional Orders / Instructions Other: - Pt. can get a new prostethic sleeve for RIGHT STUMP Non Wound Condition Other Non Wound Condition Orders/Instructions: - Apply anti-fungal cream to rash on right stump Wound Treatment Wound #2R - Metatarsal head first Wound Laterality: Left Cleanser: Soap and Water Every Other Day/7 Days Discharge Instructions: May shower and wash wound with dial antibacterial  soap and water prior to dressing change. Cleanser: Wound Cleanser (Generic) Every Other Day/7 Days Discharge Instructions: Cleanse the wound with wound cleanser prior to applying a clean dressing using gauze sponges, not tissue or cotton balls. Peri-Wound Care: Sween Lotion (Moisturizing lotion) Every Other Day/7 Days Discharge Instructions: Apply moisturizing lotion as directed Prim Dressing: Promogran Prisma Matrix, 4.34 (sq in) (silver collagen) Every Other Day/7 Days ary Discharge Instructions: Moisten collagen with saline or hydrogel Secondary Dressing: Woven Gauze Sponges 2x2 in (Generic) Every Other Day/7 Days Discharge Instructions: Apply over primary dressing as directed. Secondary Dressing: Optifoam Non-Adhesive Dressing, 4x4 in (Generic) Every Other Day/7 Days Discharge Instructions: Apply over primary dressing cut to make foam donut to help offload Secured With: Conforming Stretch Gauze Bandage, Sterile 2x75 (in/in) (Generic) Every Other Day/7 Days Discharge Instructions: Secure with stretch gauze as directed. Secured With: 13M Medipore H Soft Cloth Surgical Tape, 2x2 (in/yd) (Generic) Every Other Day/7 Days Discharge Instructions: Secure dressing with tape as directed. Wound #3 - Knee Wound Laterality: Right, Posterior Cleanser: Soap and Water Every Other Day/7 Days Discharge Instructions: May shower and wash wound with dial antibacterial soap and water prior to dressing change. Cleanser: Wound Cleanser (DME) (Generic) Every Other Day/7 Days Discharge Instructions: Cleanse the wound with wound cleanser prior to applying a clean dressing using gauze sponges, not tissue or cotton balls. Topical: Triple Antibiotic Ointment, 1 (oz) Tube Every  Other Day/7 Days Secondary Dressing: Woven Gauze Sponge, Non-Sterile 4x4 in (DME) (Generic) Every Other Day/7 Days Discharge Instructions: Apply over primary dressing as directed. Secondary Dressing: ABD Pad, 5x9 (DME) (Generic) Every Other Day/7  Days Discharge Instructions: Apply over primary dressing as directed. Secured With: [employer], 4.5x3.1 (in/yd) (DME) (Generic) Every Other Day/7 Days Discharge Instructions: Secure with Kerlix as directed. Electronic Signature(s) Signed: 04/08/2021 1:39:31 PM By: Kalman Shan DO Entered By: Kalman Shan on 04/08/2021 13:34:55 Prescription 04/08/2021 -------------------------------------------------------------------------------- Schley, Sanford L. Kalman Shan DO Patient Name: Provider: Jun 22, 1971 [id number] Date of Birth: NPI#: [name] [id number] Sex: DEA #: [phone] Phone #: License #: [place] Patient Address: [address] [place], [place] [address] [place], [place] [address] [phone] Allergies adhesive tape Provider's Orders Other: - Pt. can get a new prostethic sleeve for RIGHT STUMP Hand Signature: Date(s): Electronic Signature(s) Signed: 04/08/2021 1:39:31 PM By: Kalman Shan DO Entered By: Kalman Shan on 04/08/2021 13:34:56 -------------------------------------------------------------------------------- Problem List Details Patient Name: Date of Service: [name] [name] ND, [name] 04/08/2021 12:30 PM Medical Record Number: [id number] Patient Account Number: [id number] Date of Birth/Sex: Treating RN: 08-14-1971 (50 y.o. [name] [name], [name] [name] Primary Care Provider: [name] Other Clinician: Referring Provider: Treating Provider/Extender: Delano Metz in Treatment: 86 Active Problems ICD-10 Encounter Code Description Active Date MDM Diagnosis E11.621 Type 2 diabetes mellitus with foot ulcer 08/16/2020 No Yes S81.801A Unspecified open wound, right lower leg, initial encounter 04/08/2021 No Yes L97.522 Non-pressure chronic ulcer of other part of left foot with fat layer exposed 08/16/2020 No Yes E11.42 Type 2 diabetes  mellitus with diabetic polyneuropathy 08/16/2020 No Yes Z89.511 Acquired absence of right leg below knee 08/16/2020 No Yes Inactive Problems Resolved Problems Electronic Signature(s) Signed: 04/08/2021 1:39:31 PM By: Kalman Shan DO Entered By: Kalman Shan on 04/08/2021 13:34:02 -------------------------------------------------------------------------------- Progress Note Details Patient Name: Date of Service: [name] ND, [name] 04/08/2021 12:30 PM Medical Record Number: [id number] Patient Account Number: [id number] Date of Birth/Sex: Treating RN: 13-Mar-1971 (50 y.o. [name] [name], [name] [name] Primary Care Provider: [name] Other Clinician: Referring Provider: Treating Provider/Extender: Delano Metz in Treatment: 7 Subjective Chief Complaint Information obtained from Patient patient is here for a review of the wound on his plantar left first metatarsal head and back of his right leg History of Present Illness (HPI) 04/21/18 ADMISSION This is a 50 year old man who is a type II diabetic. In spite of fact his hemoglobin A1c is actually quite good 5.73 months ago he is at a really difficult time over the last 6-7 months. He developed a rapidly progressive infection in the right foot in November 2018 associated with osteomyelitis and necrotizing fasciitis. 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 The stump required and a revision on 11/24/17. The stump revision was advertised as being secondary to falls. I'm not sure the progressive history here however this area is actually closed over. The patient tells me over the same timeframe he has had wounds on the plantar aspect of his left foot. In the ED saw Dr. Sharol Given in April of this year. 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 It is really not clear that this patient is been dressing this with anything. He came in with the clinic without any specific dressing on  the wound areas using his own tennis shoe. He tells me that he only ambulates of  course to do a pivot transfer. He does not have his prosthesis for the right leg as of yet and he blames Medicaid for this. He does however use the foot to push himself along in his wheelchair at home. The patient has not had formal arterial studies. ABI in our clinic on the left was 1.13. Patient's past medical history includes type 2 diabetes, fracture of the right fibula. I see that he was treated for abscesses on his right buttock and chest in 2016. I did not look at the microbiology of this. 04/28/18; patient comes back in the clinic today with the wound pretty much the same as when he came in here last week. Small opening lots of undermining relatively. He tells [place] that nothing is really been on this for 3 days in spite of the fact that we gave him enough to dress this easily especially such a small wound. He says he lives with his mother, she is not capable of assisting with this he is changing the dressings himself. 05/05/18; much better-looking wound today. Smaller. There is some undermining medially however that's only perhaps 2 mm. No surrounding erythema. We've been using silver collagen 05/12/18; small wound on the first metatarsal head. No undermining no surrounding erythema. We've been using silver collagen he has home health coming out to change 05/20/18 the wound on the first metatarsal head looks better. Covered in surface debris/callus nonviable tissue. Required debridement but post debridement this looks quite good. We've been using silver collagen. He has home health 05/27/18; first metatarsal head wound continues to improve. Just about completely closed. Still a lot of surface debridement callus. We've been using silver collagen 06/06/18; the first metatarsal wound is completely healed over. There is still a lot of callus. I gently removed some of this just to make sure that there was no open area and  there is not. The patient mentions to me that his left leg has felt like "lead" for about a week READMISSION 08/16/2020 This is a 50 year old man with type 2 diabetes. He returns to clinic today with a 1 month history of a blister and callus over the first metatarsal head. This is in exactly the same area as when he was here in 2019. 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 He has standard running shoes on the left foot to match the area in his prosthesis. He has only been covering this area with a Band-Aid has not really been specifically dressing this. ABI in our clinic on the left was 1.16. This is essentially stable from the value in 2019 10/1 the patient has a linear wound over the left first metatarsal head. Again not a lot of callus thick skin around this that I removed with a #3 curette. This is not go deep to bone or muscle it does not look to be infected. 10/8 first metatarsal head. The wound looks like it is closing however this is going to be a difficult area to offload in the future. He has a size 15 shoe and he says his shoes are years old. The inserts in these current shoes are totally useless and I have advised him to replace these. He may need a new pair of shoes and I have he got original prosthesis at [employer] [facility] [place] I have asked him to go back there. 11/9; the patient has not been here in more than a month. Not sure he was  healed when he was here the last time. I told him he would have to get new shoes and certainly offload the area over the left first metatarsal head. He has done nothing of the kind. He arrives back in clinic with the same old new balance sneakers. A very large separating callus over the first metatarsal head on the left 11/16; he has purchased new shoes and has at least 2 insoles like I asked. The wound is measuring much smaller. He is using silver alginate he changes the dressing himself 11/30; wound is  measurably smaller in length of about a half a centimeter. This is generally very little depth. We have been using silver alginate. He changes the dressing himself every second day. 12/14; wound is about the same size. Significant undermining medially relative to the size of the wound. We have been using silver alginate. There is not a way to offload this area as he walks with a prosthesis on the right leg 12/28; wound is about the same size. Again he has undermining medially. I am using polymen on this 12/02/2020; the wound looks smaller. Still a senescent edge. We have been using polymen 1/24; the wound is come down a few millimeters. Still a senescent edge. I have been using polymen 2/8; the wound is come down slightly. Still with slight undermining from 12-6 o'clock I have been using polymen partially to get offloading on this area which is opposed by his prosthesis on the other leg. 2/22 quite open improvement he still has a comma shaped wound on the left first metatarsal head. Some depth. Using polymen 3/14; he still has the same problem of a comma shaped area over the left first metatarsal head. This seems to have had more depth today at 0.6 cm. We have been using polymen. The patient changes this himself every second day. I explored the idea of a total contact cast on the left leg however this is his driving foot. He was not enamored with the idea of not being able to drive and states that he does not have anybody else to get him around 3/28; again he comes in with a smaller looking wound however there is depth and undermining requiring debridement. We have been using Hydrofera Blue, changed to silver collagen today. The patient is doing the dressing himself 4/11; patient presents today for follow-up of his left diabetic foot wound. He denies any issues in the past 2 weeks. He is currently using silver collagen every other day with dressing changes. He does not use diabetic shoes or special  inserts to his sneakers. He does not use felt pads for offloading. 4/19; patient presents for his 1 week follow-up. He denies any issues. He reports using Hydrofera Blue and has not been using silver collagen for dressing changes. He reports minimal drainage to the wound. He overall feels well. 5/3; patient presents for 2-week follow-up. He has been using silver collagen without issues. He has no complaints today and states he overall feels well. 5/17; patient presents for 2-week follow-up. He states that the sleeve is rubbing up against the back of his right leg and causing a wound. He states this happened a week ago and has not been dressing the wound. The plantar wound is stable and he has been using collagen every other day. Patient History Information obtained from Patient. Family History Diabetes - Maternal Grandparents,Mother, Heart Disease - Father, Hypertension - Father, No family history of Cancer, Hereditary Spherocytosis, Kidney Disease, Lung Disease, Seizures, Stroke,  Thyroid Problems, Tuberculosis. Social History Current every day smoker, Marital Status - Single, Alcohol Use - Never, Drug Use - No History, Caffeine Use - Never. Medical History Eyes Denies history of Cataracts, Glaucoma, Optic Neuritis Ear/Nose/Mouth/Throat Denies history of Chronic sinus problems/congestion, Middle ear problems Hematologic/Lymphatic Patient has history of Anemia Denies history of Hemophilia, Human Immunodeficiency Virus, Lymphedema, Sickle Cell Disease Respiratory Denies history of Aspiration, Asthma, Chronic Obstructive Pulmonary Disease (COPD), Pneumothorax, Sleep Apnea, Tuberculosis Cardiovascular Patient has history of Hypertension, Peripheral Venous Disease Denies history of Angina, Arrhythmia, Congestive Heart Failure, Coronary Artery Disease, Deep Vein Thrombosis, Hypotension, Myocardial Infarction, Peripheral Arterial Disease, Phlebitis, Vasculitis Gastrointestinal Denies history of  Cirrhosis , Colitis, Crohnoos, Hepatitis A, Hepatitis B, Hepatitis C Endocrine Patient has history of Type II Diabetes - oral meds Denies history of Type I Diabetes Genitourinary Denies history of End Stage Renal Disease Immunological Denies history of Lupus Erythematosus, Raynaudoos, Scleroderma Integumentary (Skin) Denies history of History of Burn Musculoskeletal Denies history of Gout, Rheumatoid Arthritis, Osteoarthritis, Osteomyelitis Neurologic Denies history of Dementia, Neuropathy, Quadriplegia, Paraplegia, Seizure Disorder Oncologic Denies history of Received Chemotherapy, Received Radiation Psychiatric Denies history of Anorexia/bulimia, Confinement Anxiety Medical A Surgical History Notes nd Constitutional Symptoms (General Health) falls , leukocytosis Respiratory During waking hours and catches himself not breathing, "gasp for air". Saw Dr Wynonia Lawman, he couldn't find a reason Objective Constitutional respirations regular, non-labored and within target range for patient.. Vitals Time Taken: 12:44 PM, Height: 77 in, Source: Stated, Weight: 232 lbs, Source: Stated, BMI: 27.5, Temperature: 98.2 F, Pulse: 80 bpm, Respiratory Rate: 18 breaths/min, Blood Pressure: 128/82 mmHg, Capillary Blood Glucose: 82 mg/dl. General Notes: glucose per pt report last night Psychiatric pleasant and cooperative. General Notes: Left first metatarsal head: Small opening with circumferential callus And nonviable tissue. Once callus was removed subcutaneous tissue was debrided and good granulation tissue is present. No acute signs of infection. Posterior right knee: 2 wounds limited to skin breakdown. Right leg has scattered red areas of dermatitis similar to fungal infection Integumentary (Hair, Skin) Wound #2R status is Open. Original cause of wound was Blister. The date acquired was: 06/23/2020. The wound has been in treatment 33 weeks. The wound is located on the Left Metatarsal head first.  The wound measures 0.3cm length x 0.2cm width x 0.2cm depth; 0.047cm^2 area and 0.009cm^3 volume. There is Fat Layer (Subcutaneous Tissue) exposed. There is no tunneling noted, however, there is undermining starting at 12:00 and ending at 12:00 with a maximum distance of 0.3cm. There is a small amount of serosanguineous drainage noted. The wound margin is well defined and not attached to the wound base. There is large (67-100%) pink, pale granulation within the wound bed. There is no necrotic tissue within the wound bed. Wound #3 status is Open. Original cause of wound was Pressure Injury. The date acquired was: 03/31/2021. The wound is located on the Right,Posterior Knee. The wound measures 0.9cm length x 2.3cm width x 0.1cm depth; 1.626cm^2 area and 0.163cm^3 volume. There is Fat Layer (Subcutaneous Tissue) exposed. There is no tunneling noted. There is a small amount of serous drainage noted. The wound margin is flat and intact. There is small (1-33%) red granulation within the wound bed. There is a large (67-100%) amount of necrotic tissue within the wound bed including Eschar and Adherent Slough. 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 Post debridement there was granulation tissue present. We will continue with silver collagen every other day and aggressive offloading. Patient now presents with a new wound to the back of his right knee. 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 He also has what appears to be a fungal infection on his right stump. I recommended over-the-counter antifungal cream and a new prescription for a sleeve. For the wounds on the back of the  leg I recommended antibiotic cream daily and gauze padding to keep this protected. Procedures Wound #2R Pre-procedure diagnosis of Wound #2R is a Diabetic Wound/Ulcer of the Lower Extremity located on the Left Metatarsal head first .Severity of Tissue Pre Debridement is: Fat layer exposed. There was a Excisional Skin/Subcutaneous Tissue Debridement with a total area of 0.06 sq cm performed by Kalman Shan, DO. With the following instrument(s): Curette to remove Viable and Non-Viable tissue/material. Material removed includes Callus, Subcutaneous Tissue, Slough, Skin: Dermis, and Skin: Epidermis after achieving pain control using Lidocaine. No specimens were taken. A time out was conducted at 13:04, prior to the start of the procedure. A Minimum amount of bleeding was controlled with Pressure. The procedure was tolerated well with a pain level of 0 throughout and a pain level of 0 following the procedure. Post Debridement Measurements: 0.3cm length x 0.2cm width x 0.2cm depth; 0.009cm^3 volume. Character of Wound/Ulcer Post Debridement is improved. Severity of Tissue Post Debridement is: Fat layer exposed. Post procedure Diagnosis Wound #2R: Same as Pre-Procedure Plan Follow-up Appointments: Return Appointment in 1 week. - dr Heber [place]!!!! Bathing/ Shower/ Hygiene: May shower and wash wound with soap and water. - WITH DRESSING CHANGES. Edema Control - Lymphedema / SCD / Other: Elevate legs to the level of the heart or above for 30 minutes daily and/or when sitting, a frequency of: Avoid standing for long periods of time. Off-Loading: Other: - use felt for offloading Additional Orders / Instructions: Other: - Pt. can get a new prostethic sleeve for RIGHT STUMP Non Wound Condition: Other Non Wound Condition Orders/Instructions: - Apply anti-fungal cream to rash on right stump WOUND #2R: - Metatarsal head first Wound Laterality: Left Cleanser: Soap and Water Every Other Day/7 Days Discharge  Instructions: May shower and wash wound with dial antibacterial soap and water prior to dressing change. Cleanser: Wound Cleanser (Generic) Every Other Day/7 Days Discharge Instructions: Cleanse the wound with wound cleanser prior to applying a clean dressing using gauze sponges, not tissue or cotton balls. Peri-Wound Care: Sween Lotion (Moisturizing lotion) Every Other Day/7 Days Discharge Instructions: Apply moisturizing lotion as directed Prim Dressing: Promogran Prisma Matrix, 4.34 (sq in) (silver collagen) Every Other Day/7 Days ary Discharge Instructions: Moisten collagen with saline or hydrogel Secondary Dressing: Woven Gauze Sponges 2x2 in (Generic) Every Other Day/7 Days Discharge Instructions: Apply over primary dressing as directed. Secondary Dressing: Optifoam Non-Adhesive Dressing, 4x4 in (Generic) Every Other Day/7 Days Discharge Instructions: Apply over primary dressing cut to make foam donut to help offload Secured With: Conforming Stretch Gauze Bandage, Sterile 2x75 (in/in) (Generic) Every Other Day/7 Days Discharge Instructions: Secure with stretch gauze as directed. Secured With: 62M Medipore H Soft Cloth Surgical T ape, 2x2 (in/yd) (Generic) Every Other Day/7 Days Discharge Instructions: Secure dressing with tape as directed. WOUND #3: - Knee Wound Laterality: Right, Posterior Cleanser: Soap and Water Every Other Day/7 Days Discharge Instructions: May shower and wash  wound with dial antibacterial soap and water prior to dressing change. Cleanser: Wound Cleanser (DME) (Generic) Every Other Day/7 Days Discharge Instructions: Cleanse the wound with wound cleanser prior to applying a clean dressing using gauze sponges, not tissue or cotton balls. Topical: Triple Antibiotic Ointment, 1 (oz) Tube Every Other Day/7 Days Secondary Dressing: Woven Gauze Sponge, Non-Sterile 4x4 in (DME) (Generic) Every Other Day/7 Days Discharge Instructions: Apply over primary dressing as  directed. Secondary Dressing: ABD Pad, 5x9 (DME) (Generic) Every Other Day/7 Days Discharge Instructions: Apply over primary dressing as directed. Secured With: [employer], 4.5x3.1 (in/yd) (DME) (Generic) Every Other Day/7 Days Discharge Instructions: Secure with Kerlix as directed. 1. In office sharp debridement 2. New right sleeve prescription 3. Antifungal cream - right leg 4. Antibiotic cream - right leg 5. Follow-up in 1 week 6. Silver collagen - plantar foot wound Electronic Signature(s) Signed: 04/08/2021 1:39:31 PM By: Kalman Shan DO Entered By: Kalman Shan on 04/08/2021 13:38:21 -------------------------------------------------------------------------------- HxROS Details Patient Name: Date of Service: [name] ND, [name] 04/08/2021 12:30 PM Medical Record Number: [id number] Patient Account Number: [id number] Date of Birth/Sex: Treating RN: [birth year] (50 y.o. [name] [name] Primary Care Provider: [name] Other Clinician: Referring Provider: Treating Provider/Extender: Delano Metz in Treatment: 68 Information Obtained From Patient Constitutional Symptoms (General Health) Medical History: Past Medical History Notes: falls , leukocytosis Eyes Medical History: Negative for: Cataracts; Glaucoma; Optic Neuritis Ear/Nose/Mouth/Throat Medical History: Negative for: Chronic sinus problems/congestion; Middle ear problems Hematologic/Lymphatic Medical History: Positive for: Anemia Negative for: Hemophilia; Human Immunodeficiency Virus; Lymphedema; Sickle Cell Disease Respiratory Medical History: Negative for: Aspiration; Asthma; Chronic Obstructive Pulmonary Disease (COPD); Pneumothorax; Sleep Apnea; Tuberculosis Past Medical History Notes: During waking hours and catches himself not breathing, "gasp for air". Saw Dr Wynonia Lawman, he couldn't find a reason Cardiovascular Medical History: Positive for: Hypertension;  Peripheral Venous Disease Negative for: Angina; Arrhythmia; Congestive Heart Failure; Coronary Artery Disease; Deep Vein Thrombosis; Hypotension; Myocardial Infarction; Peripheral Arterial Disease; Phlebitis; Vasculitis Gastrointestinal Medical History: Negative for: Cirrhosis ; Colitis; Crohns; Hepatitis A; Hepatitis B; Hepatitis C Endocrine Medical History: Positive for: Type II Diabetes - oral meds Negative for: Type I Diabetes Time with diabetes: 2014 Treated with: Oral agents Blood sugar tested every day: Yes Tested : Blood sugar testing results: Breakfast: 91 Genitourinary Medical History: Negative for: End Stage Renal Disease Immunological Medical History: Negative for: Lupus Erythematosus; Raynauds; Scleroderma Integumentary (Skin) Medical History: Negative for: History of Burn Musculoskeletal Medical History: Negative for: Gout; Rheumatoid Arthritis; Osteoarthritis; Osteomyelitis Neurologic Medical History: Negative for: Dementia; Neuropathy; Quadriplegia; Paraplegia; Seizure Disorder Oncologic Medical History: Negative for: Received Chemotherapy; Received Radiation Psychiatric Medical History: Negative for: Anorexia/bulimia; Confinement Anxiety Immunizations Pneumococcal Vaccine: Received Pneumococcal Vaccination: No Implantable Devices No devices added Family and Social History Cancer: No; Diabetes: Yes - Maternal Grandparents,Mother; Heart Disease: Yes - Father; Hereditary Spherocytosis: No; Hypertension: Yes - Father; Kidney Disease: No; Lung Disease: No; Seizures: No; Stroke: No; Thyroid Problems: No; Tuberculosis: No; Current every day smoker; Marital Status - Single; Alcohol Use: Never; Drug Use: No History; Caffeine Use: Never; Financial Concerns: No; Food, Clothing or Shelter Needs: No; Support System Lacking: No; Transportation Concerns: No Electronic Signature(s) Signed: 04/08/2021 1:39:31 PM By: Kalman Shan DO Signed: 04/08/2021 5:09:03 PM By:  Rhae Hammock RN Entered By: Kalman Shan on 04/08/2021 13:28:09 -------------------------------------------------------------------------------- SuperBill Details Patient Name: Date of Service: [name] ND, Ray L. 04/08/2021 Medical Record Number: [id number] Patient Account Number: [id number] Date of Birth/Sex: Treating RN: [birth year] (50  y.o. [name] [name], [name] [name] Primary Care Provider: [name] Other Clinician: Referring Provider: Treating Provider/Extender: Delano Metz in Treatment: 33 Diagnosis Coding ICD-10 Codes Code Description E11.621 Type 2 diabetes mellitus with foot ulcer S81.801A Unspecified open wound, right lower leg, initial encounter L97.522 Non-pressure chronic ulcer of other part of left foot with fat layer exposed E11.42 Type 2 diabetes mellitus with diabetic polyneuropathy Z89.511 Acquired absence of right leg below knee Facility Procedures CPT4 Code: [id number] Description: [facility] - DEB SUBQ TISSUE 20 SQ CM/< ICD-10 Diagnosis Description L97.522 Non-pressure chronic ulcer of other part of left foot with fat layer exposed Modifier: Quantity: 1 Physician Procedures : CPT4 Code Description Modifier [id number] - WC PHYS LEVEL 3 - EST PT ICD-10 Diagnosis Description S81.801A Unspecified open wound, right lower leg, initial encounter Z89.511 Acquired absence of right leg below knee Quantity: 1 : [id number] - WC PHYS SUBQ TISS 20 SQ CM ICD-10 Diagnosis Description L97.522 Non-pressure chronic ulcer of other part of left foot with fat layer exposed Quantity: 1 Electronic Signature(s) Signed: 04/08/2021 1:39:31 PM By: Kalman Shan DO Entered By: Kalman Shan on 04/08/2021 13:39:06

## 2021-04-09 ENCOUNTER — Other Ambulatory Visit: Payer: Self-pay

## 2021-04-09 ENCOUNTER — Encounter (HOSPITAL_COMMUNITY): Payer: Self-pay

## 2021-04-09 ENCOUNTER — Inpatient Hospital Stay (HOSPITAL_COMMUNITY)
Admission: EM | Admit: 2021-04-09 | Discharge: 2021-04-12 | DRG: 872 | Disposition: A | Discharge status: Home / Self Care | Payer: Medicare Other | Attending: Internal Medicine | Admitting: Internal Medicine

## 2021-04-09 ENCOUNTER — Emergency Department (HOSPITAL_COMMUNITY): Payer: Medicare Other

## 2021-04-09 DIAGNOSIS — I10 Essential (primary) hypertension: Secondary | ICD-10-CM | POA: Diagnosis present

## 2021-04-09 DIAGNOSIS — Z89511 Acquired absence of right leg below knee: Secondary | ICD-10-CM

## 2021-04-09 DIAGNOSIS — R6520 Severe sepsis without septic shock: Secondary | ICD-10-CM | POA: Diagnosis present

## 2021-04-09 DIAGNOSIS — A419 Sepsis, unspecified organism: Principal | ICD-10-CM | POA: Diagnosis present

## 2021-04-09 DIAGNOSIS — N189 Chronic kidney disease, unspecified: Secondary | ICD-10-CM | POA: Diagnosis present

## 2021-04-09 DIAGNOSIS — Z86010 Personal history of colonic polyps: Secondary | ICD-10-CM

## 2021-04-09 DIAGNOSIS — Z7984 Long term (current) use of oral hypoglycemic drugs: Secondary | ICD-10-CM

## 2021-04-09 DIAGNOSIS — L0390 Cellulitis, unspecified: Secondary | ICD-10-CM | POA: Diagnosis present

## 2021-04-09 DIAGNOSIS — Z818 Family history of other mental and behavioral disorders: Secondary | ICD-10-CM

## 2021-04-09 DIAGNOSIS — F17210 Nicotine dependence, cigarettes, uncomplicated: Secondary | ICD-10-CM | POA: Diagnosis present

## 2021-04-09 DIAGNOSIS — Z833 Family history of diabetes mellitus: Secondary | ICD-10-CM

## 2021-04-09 DIAGNOSIS — D649 Anemia, unspecified: Secondary | ICD-10-CM | POA: Diagnosis present

## 2021-04-09 DIAGNOSIS — Z8371 Family history of colonic polyps: Secondary | ICD-10-CM

## 2021-04-09 DIAGNOSIS — E1140 Type 2 diabetes mellitus with diabetic neuropathy, unspecified: Secondary | ICD-10-CM | POA: Diagnosis present

## 2021-04-09 DIAGNOSIS — L03116 Cellulitis of left lower limb: Secondary | ICD-10-CM | POA: Diagnosis present

## 2021-04-09 DIAGNOSIS — N179 Acute kidney failure, unspecified: Secondary | ICD-10-CM | POA: Diagnosis present

## 2021-04-09 DIAGNOSIS — Z91048 Other nonmedicinal substance allergy status: Secondary | ICD-10-CM

## 2021-04-09 DIAGNOSIS — F32A Depression, unspecified: Secondary | ICD-10-CM | POA: Diagnosis present

## 2021-04-09 DIAGNOSIS — I152 Hypertension secondary to endocrine disorders: Secondary | ICD-10-CM | POA: Diagnosis present

## 2021-04-09 DIAGNOSIS — Z20822 Contact with and (suspected) exposure to covid-19: Secondary | ICD-10-CM | POA: Diagnosis present

## 2021-04-09 DIAGNOSIS — Z79899 Other long term (current) drug therapy: Secondary | ICD-10-CM

## 2021-04-09 DIAGNOSIS — F419 Anxiety disorder, unspecified: Secondary | ICD-10-CM | POA: Diagnosis present

## 2021-04-09 DIAGNOSIS — K219 Gastro-esophageal reflux disease without esophagitis: Secondary | ICD-10-CM | POA: Diagnosis present

## 2021-04-09 DIAGNOSIS — L03115 Cellulitis of right lower limb: Secondary | ICD-10-CM | POA: Diagnosis present

## 2021-04-09 DIAGNOSIS — E785 Hyperlipidemia, unspecified: Secondary | ICD-10-CM | POA: Diagnosis present

## 2021-04-09 DIAGNOSIS — D72829 Elevated white blood cell count, unspecified: Secondary | ICD-10-CM | POA: Diagnosis present

## 2021-04-09 LAB — CBC WITH DIFFERENTIAL/PLATELET
Abs Immature Granulocytes: 0.08 10*3/uL — ABNORMAL HIGH (ref 0.00–0.07)
Basophils Absolute: 0.1 10*3/uL (ref 0.0–0.1)
Basophils Relative: 1 %
Eosinophils Absolute: 0.1 10*3/uL (ref 0.0–0.5)
Eosinophils Relative: 1 %
HCT: 31.2 % — ABNORMAL LOW (ref 39.0–52.0)
Hemoglobin: 9.4 g/dL — ABNORMAL LOW (ref 13.0–17.0)
Immature Granulocytes: 1 %
Lymphocytes Relative: 4 %
Lymphs Abs: 0.6 10*3/uL — ABNORMAL LOW (ref 0.7–4.0)
MCH: 28 pg (ref 26.0–34.0)
MCHC: 30.1 g/dL (ref 30.0–36.0)
MCV: 92.9 fL (ref 80.0–100.0)
Monocytes Absolute: 0.9 10*3/uL (ref 0.1–1.0)
Monocytes Relative: 6 %
Neutro Abs: 13.7 10*3/uL — ABNORMAL HIGH (ref 1.7–7.7)
Neutrophils Relative %: 87 %
Platelets: 198 10*3/uL (ref 150–400)
RBC: 3.36 MIL/uL — ABNORMAL LOW (ref 4.22–5.81)
RDW: 14.8 % (ref 11.5–15.5)
WBC: 15.4 10*3/uL — ABNORMAL HIGH (ref 4.0–10.5)
nRBC: 0 % (ref 0.0–0.2)

## 2021-04-09 LAB — BASIC METABOLIC PANEL
Anion gap: 5 (ref 5–15)
BUN: 21 mg/dL — ABNORMAL HIGH (ref 6–20)
CO2: 23 mmol/L (ref 22–32)
Calcium: 8.3 mg/dL — ABNORMAL LOW (ref 8.9–10.3)
Chloride: 110 mmol/L (ref 98–111)
Creatinine, Ser: 2.19 mg/dL — ABNORMAL HIGH (ref 0.61–1.24)
GFR, Estimated: 36 mL/min — ABNORMAL LOW (ref >60)
Glucose, Bld: 201 mg/dL — ABNORMAL HIGH (ref 70–99)
Potassium: 4.3 mmol/L (ref 3.5–5.1)
Sodium: 138 mmol/L (ref 135–145)

## 2021-04-09 LAB — RESP PANEL BY RT-PCR (FLU A&B, COVID) ARPGX2
Influenza A by PCR: NEGATIVE
Influenza B by PCR: NEGATIVE
SARS Coronavirus 2 by RT PCR: NEGATIVE

## 2021-04-09 LAB — PROTIME-INR
INR: 1 (ref 0.8–1.2)
Prothrombin Time: 13.5 seconds (ref 11.4–15.2)

## 2021-04-09 LAB — LACTIC ACID, PLASMA: Lactic Acid, Venous: 1.5 mmol/L (ref 0.5–1.9)

## 2021-04-09 LAB — APTT: aPTT: 31 seconds (ref 24–36)

## 2021-04-09 MED ORDER — LACTATED RINGERS IV SOLN: INTRAVENOUS | Status: DC

## 2021-04-09 MED ORDER — ACETAMINOPHEN 325 MG PO TABS: 650.0000 mg | ORAL_TABLET | Freq: Once | ORAL | Status: DC

## 2021-04-09 MED ORDER — PIPERACILLIN-TAZOBACTAM 3.375 G IVPB 30 MIN
3.3750 g | Freq: Once | INTRAVENOUS | Status: AC
Start: 2021-04-09 — End: 2021-04-09
  Administered 2021-04-09: 3.375 g via INTRAVENOUS
  Filled 2021-04-09: qty 50

## 2021-04-09 MED ORDER — VANCOMYCIN HCL 2000 MG/400ML IV SOLN
2000.0000 mg | Freq: Once | INTRAVENOUS | Status: AC
  Administered 2021-04-09: 2000 mg via INTRAVENOUS
  Filled 2021-04-09: qty 400

## 2021-04-09 MED ORDER — SODIUM CHLORIDE 0.9 % IV BOLUS
1000.0000 mL | Freq: Once | INTRAVENOUS | Status: AC
  Administered 2021-04-09: 1000 mL via INTRAVENOUS

## 2021-04-09 MED ORDER — VANCOMYCIN HCL 1250 MG/250ML IV SOLN: 1250.0000 mg | INTRAVENOUS | Status: DC

## 2021-04-09 MED ORDER — ACETAMINOPHEN 500 MG PO TABS
1000.0000 mg | ORAL_TABLET | Freq: Once | ORAL | Status: AC
  Administered 2021-04-09: 1000 mg via ORAL
  Filled 2021-04-09: qty 2

## 2021-04-09 NOTE — ED Provider Notes (Signed)
[facility] EMERGENCY DEPARTMENT Provider Note   CSN: [id number] Arrival date & time: 04/09/21  2013     History Chief Complaint  Patient presents with  . Fever  . Dizziness    [name] [name] is a 50 y.o. male with a hx of diabetes mellitus, hypertension, hyperlipidemia, anemia, prior BKA, and prior necrotizing fascitis who presents to the ED with complaints of fever that began today around 1600 this afternoon. Patient states he has had problems to his right posterior knee/leg for about 1 week- started with irritation/erythema, developed discomfort, and has now had 3 days of drainage. Today he started to feel ill with a fever, temp at home per his report was 105, with associated chills & lightheadedness. No alleviating/aggravating factors. Has had some mild congestion and urinary frequency as well on further questioning. Denies ear pain, sore throat, cough, chest pain, N/V/D, abdominal pain, dyspnea, or syncope.   HPI     Past Medical History:  Diagnosis Date  . Allergy    seasonal and environmental  . ARF (acute renal failure) ([place]) 09/2017  . Chronic constipation 05/13/2020  . Depression   . Diabetes mellitus without complication ([place]) 6962  . Hyperlipidemia   . Hypertension   . Necrotizing fasciitis ([place]) 10/13/2017  . Neuromuscular disorder ([facility])    neuropathy  . Wound dehiscence 11/24/2017    Patient Active Problem List   Diagnosis Date Noted  . 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 Non-pressure chronic ulcer of other part of left foot limited to breakdown of skin ([place]) 02/02/2018  . 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 History of right below knee amputation ([place]) 12/22/2017  . Fever   . Acute posthemorrhagic anemia   . Leukocytosis   . Falls 11/24/2017  . Benign essential HTN 11/24/2017  . Normocytic normochromic anemia 11/24/2017  . Sepsis ([place]) 10/11/2017  . Uncontrolled type 2 diabetes mellitus with hyperglycemia  ([place]) 10/11/2017  . ARF (acute renal failure) ([place]) 10/11/2017  . Nausea & vomiting 10/11/2017    Past Surgical History:  Procedure Laterality Date  . AMPUTATION Right 10/13/2017   Procedure: RIGHT BELOW KNEE AMPUTATION;  Surgeon: [name], MD;  Location[place]  Service: Orthopedics;  Laterality: Right;  . AMPUTATION Right 11/24/2017   Procedure: AMPUTATION BELOW KNEE REVISION;  Surgeon: [name], MD;  Location: [place];  Service: Orthopedics;  Laterality: Right;  . COLONOSCOPY  05/11/2019  . NO PAST SURGERIES    . POLYPECTOMY    . WISDOM TOOTH EXTRACTION         Family History  Problem Relation Age of Onset  . Diabetes Mellitus II Mother   . Depression Mother   . Colon polyps Mother   . Heart disease Father        CABG x 4.  04/2017  . Colon cancer Neg Hx   . Esophageal cancer Neg Hx   . Liver cancer Neg Hx   . Pancreatic cancer Neg Hx   . Rectal cancer Neg Hx   . Stomach cancer Neg Hx     Social History   Tobacco Use  . Smoking status: Current Every Day Smoker    Packs/day: 0.25    Years: 35.00    Pack years: 8.75    Types: Cigarettes  . Smokeless tobacco: Never Used  . Tobacco comment: stopped, but back on 1 cigarette daily.  Vaping Use  . Vaping Use: Never used  Substance Use Topics  .  Alcohol use: No  . Drug use: No    Home Medications Prior to Admission medications   Medication Sig Start Date End Date Taking? Authorizing Provider  Truddie Crumble ULTRA-THIN LANCETS MISC Check blood sugars before meals twice daily 11/19/17   Mack Hook, MD  amitriptyline (ELAVIL) 100 MG tablet Take 200 mg by mouth at bedtime.  01/10/19   [provider]  amLODipine (NORVASC) 10 MG tablet Take 10 mg by mouth every morning.     [provider]  ARIPiprazole (ABILIFY) 20 MG tablet Take 20 mg by mouth every morning. 08/06/19   [provider]  atorvastatin (LIPITOR) 10 MG tablet Take 10 mg by mouth every morning. 06/07/19   [provider]  baclofen (LIORESAL) 10 MG tablet 1/2 tab twice a day Patient taking differently: Take 20 mg by mouth 2 (two) times daily. 01/28/18   Mack Hook, MD  bisacodyl (DULCOLAX) 5 MG EC tablet Take 5 mg by mouth daily as needed for moderate constipation.    [provider]  Blood Glucose Monitoring Suppl (AGAMATRIX PRESTO) w/Device KIT Check sugars twice daily before meals 11/19/17   Mack Hook, MD  Cholecalciferol-Vitamin C (VITAMIN D3-VITAMIN C PO) Take 1 tablet by mouth at bedtime.    [provider]  desvenlafaxine (PRISTIQ) 100 MG 24 hr tablet Take 100 mg by mouth daily. 02/20/20   [provider]  docusate sodium (COLACE) 100 MG capsule Take 1 capsule (100 mg total) by mouth 2 (two) times daily. Patient taking differently: Take 200 mg by mouth daily as needed. 11/30/17   Buriev, Arie Sabina, MD  Dulaglutide (TRULICITY) 6.16 WV/3.7TG SOPN Inject 0.75 mg into the skin once a week. 03/21/21   Renato Shin, MD  DULoxetine (CYMBALTA) 60 MG capsule Take 60 mg by mouth 2 (two) times daily.  01/04/19   [provider]  famotidine (PEPCID) 20 MG tablet Take 1 tablet (20 mg total) by mouth 2 (two) times daily. 07/04/20   Tasia Catchings, Amy V, PA-C  FARXIGA 5 MG TABS tablet Take 5 mg by mouth daily. 04/08/20   [provider]  fluticasone (FLONASE) 50 MCG/ACT nasal spray Place 2 sprays into both nostrils daily as needed for allergies or rhinitis.  01/09/19   [provider]  gabapentin (NEURONTIN) 300 MG capsule 1 cap by mouth in morning and midday, 2 caps by mouth at bedtime Patient taking differently: Take 300-600 mg by mouth See admin instructions. Take one capsule (300 mg) by mouth in the morning and afternoon, take two capsules (600 mg) at bedtime 11/19/17   Mack Hook, MD  glipiZIDE (GLUCOTROL) 5 MG tablet 1/2 tab by mouth twice daily with meal Patient taking differently: 5 mg. Pt states change to 34m tid with meals 11/19/17    MMack Hook MD  glucose blood (AGAMATRIX PRESTO TEST) test strip Check blood sugars twice daily before meals 02/02/18   MMack Hook MD  lisinopril (ZESTRIL) 20 MG tablet Take 20 mg by mouth 2 (two) times daily. 02/29/20   [provider]  metFORMIN (GLUCOPHAGE-XR) 500 MG 24 hr tablet Take 4 tablets (2,000 mg total) by mouth daily with breakfast. 03/21/21   ERenato Shin MD  metoCLOPramide (REGLAN) 5 MG tablet Take 1 tablet (5 mg total) by mouth 3 (three) times daily before meals for 10 days. 07/04/20 07/14/20  YOk Edwards PA-C  olmesartan-hydrochlorothiazide (BENICAR HCT) 40-12.5 MG tablet Take 1 tablet by mouth daily. 03/21/20   [provider]  ondansetron (ZOFRAN) 8 MG  tablet Take 8 mg by mouth every 8 (eight) hours as needed. 12/01/19   [provider]  polyethylene glycol (MIRALAX / GLYCOLAX) 17 g packet Take 17 g by mouth daily. 04/28/19   Doran Stabler, MD    Allergies    Adhesive [tape]  Review of Systems   Review of Systems  Constitutional: Positive for chills and fever.  HENT: Positive for congestion. Negative for ear pain and sore throat.   Respiratory: Negative for cough and shortness of breath.   Cardiovascular: Negative for chest pain.  Gastrointestinal: Negative for abdominal pain, diarrhea, nausea and vomiting.  Genitourinary: Negative for dysuria.  Musculoskeletal: Positive for myalgias.  Skin: Positive for color change.  Neurological: Positive for light-headedness. Negative for syncope, speech difficulty, weakness, numbness and headaches.  All other systems reviewed and are negative.   Physical Exam Updated Vital Signs BP (!) 148/96 (BP Location: Right Arm)   Pulse (!) 115   Temp (!) 101.9 F (38.8 C) (Oral)   Resp 16   SpO2 100%   Physical Exam Vitals and nursing note reviewed.  Constitutional:      General: He is not in acute distress.    Appearance: He is not toxic-appearing.  HENT:     Head: Normocephalic and  atraumatic.  Eyes:     Pupils: Pupils are equal, round, and reactive to light.  Cardiovascular:     Rate and Rhythm: Regular rhythm. Tachycardia present.  Pulmonary:     Effort: Pulmonary effort is normal.     Breath sounds: Normal breath sounds.  Abdominal:     General: There is no distension.     Palpations: Abdomen is soft.     Tenderness: There is no abdominal tenderness. There is no guarding or rebound.  Musculoskeletal:     Cervical back: Neck supple. No rigidity.     Comments: RLE: BKA present. Posterior right knee is erythematous with central area of purulence, mildly warm to the touch. Tender to palpation. Erythema is non-circumferential, patient able to fully flex/extend the knee. Otherwise nontender.  LLE: wound to plantar aspect of the left foot, no significant erythema, warmth, or drainage.   Skin:    General: Skin is warm and dry.     Comments: Dry skin throughout   Neurological:     Mental Status: He is alert.     Comments: Alert clear speech. Moving all extremities.   Psychiatric:        Mood and Affect: Mood normal.        Behavior: Behavior normal.             ED Results / Procedures / Treatments   Labs (all labs ordered are listed, but only abnormal results are displayed) Labs Reviewed  CBC WITH DIFFERENTIAL/PLATELET - Abnormal; Notable for the following components:      Result Value   WBC 15.4 (*)    RBC 3.36 (*)    Hemoglobin 9.4 (*)    HCT 31.2 (*)    Neutro Abs 13.7 (*)    Lymphs Abs 0.6 (*)    Abs Immature Granulocytes 0.08 (*)    All other components within normal limits  BASIC METABOLIC PANEL - Abnormal; Notable for the following components:   Glucose, Bld 201 (*)    BUN 21 (*)    Creatinine, Ser 2.19 (*)    Calcium 8.3 (*)    GFR, Estimated 36 (*)    All other components within normal limits  URINALYSIS, ROUTINE W  REFLEX MICROSCOPIC - Abnormal; Notable for the following components:   Glucose, UA >=500 (*)    Hgb urine dipstick  SMALL (*)    Protein, ur >=300 (*)    Bacteria, UA RARE (*)    All other components within normal limits  RESP PANEL BY RT-PCR (FLU A&B, COVID) ARPGX2  URINE CULTURE  CULTURE, BLOOD (ROUTINE X 2)  CULTURE, BLOOD (ROUTINE X 2)  AEROBIC CULTURE W GRAM STAIN (SUPERFICIAL SPECIMEN)  LACTIC ACID, PLASMA  PROTIME-INR  APTT  LACTIC ACID, PLASMA  HIV ANTIBODY (ROUTINE TESTING W REFLEX)  BASIC METABOLIC PANEL  CBC    EKG None  Radiology DG Chest Port 1 View  Result Date: 04/09/2021 CLINICAL DATA:  Fever with dizziness and nausea. EXAM: PORTABLE CHEST 1 VIEW COMPARISON:  December 18, 2020 FINDINGS: The heart size and mediastinal contours are within normal limits. Both lungs are clear. No acute osseous abnormalities are identified. IMPRESSION: No active disease. Electronically Signed   By: Virgina Norfolk M.D.   On: 04/09/2021 22:41   DG Knee Complete 4 Views Right  Result Date: 04/09/2021 CLINICAL DATA:  Open wound to the back of the right knee with fever. EXAM: RIGHT KNEE - COMPLETE 4+ VIEW COMPARISON:  None. FINDINGS: There is evidence of below the knee surgical amputation of the right leg. No evidence of fracture, dislocation, or joint effusion. No focal bone abnormality is seen. A tiny linear soft tissue defect is seen along the lateral aspect of the surgical stump site. IMPRESSION: Below the knee surgical amputation of the right leg, without an acute osseous abnormality. Electronically Signed   By: Virgina Norfolk M.D.   On: 04/09/2021 22:45    Procedures Procedures   Medications Ordered in ED Medications  acetaminophen (TYLENOL) tablet 650 mg (has no administration in time range)    ED Course  I have reviewed the triage vital signs and the nursing notes.  Pertinent labs & imaging results that were available during my care of the patient were reviewed by me and considered in my medical decision making (see chart for details).    MDM Rules/Calculators/A&P                           Patient presents to the ED with complaints of fever. Febrile and tachycardic on arrival. BP mildly elevated. Concern for sepsis- plan for fluids, abx, additional laboratory testing & CXR/R knee x-ray.   Additional history obtained:  Additional history obtained from chart review & nursing note review.   Lab Tests:  I Ordered, reviewed, and interpreted labs, which included:  CBC: Leukocytosis. Mild anemia- has had similar with prior ranges.  BMP: increase in creatinine compared to prior.  PT/INR/APTT: WNL Lactic acid: WNL COVID/influenza: negative UA: No obvious UTI  Imaging Studies ordered:  I ordered imaging studies which included CXR & R knee x-ray, I independently reviewed, formal radiology impression shows:  CXR: No active disease.  R knee x-ray: Below the knee surgical amputation of the right leg, without an acute osseous abnormality.  ED Course:  Diabetic, meets SIRS criteria, concern for cellulitis to the RLE as source at this time- covid/flu negative, CXR w/o infiltrate, UA without significant infection, no meningismus. Good ROM of the joint, no circumferential erythema, seems less likely to be a septic joint. Vanc/zosyn started- discussed w/ attending, pharmacy consult for dosing. Will discuss w/ medicine to admit. Findings and plan of care discussed with supervising physician Dr. Almyra Free who is  in agreement.   01:59: CONSULT: Discussed with hospitalist Dr. Tobie Poet- accepts admission.   Portions of this note were generated with [employer]. Dictation errors may occur despite best attempts at proofreading.  Final Clinical Impression(s) / ED Diagnoses Final diagnoses:  Cellulitis of right lower extremity    Rx / DC Orders ED Discharge Orders    None       Leafy Kindle 04/10/21 0217    Luna Fuse, MD 04/11/21 0700

## 2021-04-09 NOTE — ED Triage Notes (Signed)
Fever started today and now having dizziness and nausea.

## 2021-04-09 NOTE — Progress Notes (Signed)
GREIG, ALTERGOTT ([id number]) Visit Report for 04/08/2021 Arrival Information Details Patient Name: Date of Service: [name] [name] 04/08/2021 12:30 PM Medical Record Number: [id number] Patient Account Number: [id number] Date of Birth/Sex: Treating RN: [birth year] (50 y.o. [name] [name], Vaughan Basta Primary Care Ariyan Brisendine: [name] Other Clinician: Referring Estevan Kersh: Treating Saturnino Liew/Extender: Delano Metz in Treatment: 37 Visit Information History Since Last Visit Added or deleted any medications: Yes Patient Arrived: Ambulatory Any new allergies or adverse reactions: No Arrival Time: 12:43 Had a fall or experienced change in No Accompanied By: self activities of daily living that may affect Transfer Assistance: None risk of falls: Patient Identification Verified: Yes Signs or symptoms of abuse/neglect since last visito No Secondary Verification Process Completed: Yes Hospitalized since last visit: No Patient Requires Transmission-Based Precautions: No Implantable device outside of the clinic excluding No Patient Has Alerts: No cellular tissue based products placed in the center since last visit: Has Dressing in Place as Prescribed: Yes Pain Present Now: No Electronic Signature(s) Signed: 04/08/2021 5:35:12 PM By: Baruch Gouty RN, BSN Entered By: Baruch Gouty on 04/08/2021 12:43:59 -------------------------------------------------------------------------------- Encounter Discharge Information Details Patient Name: Date of Service: [name] [name] ND, [name] 04/08/2021 12:30 PM Medical Record Number: [id number] Patient Account Number: [id number] Date of Birth/Sex: Treating RN: [birth year] [name] [name] (50 y.o. [name] [name] Primary Care Edrick Whitehorn: [name] Other Clinician: Referring Namon Villarin: Treating Loza Prell/Extender: Delano Metz in Treatment: 80 Encounter Discharge Information Items Post Procedure  Vitals Discharge Condition: Stable Temperature (F): 98.2 Ambulatory Status: Ambulatory Pulse (bpm): 80 Discharge Destination: Home Respiratory Rate (breaths/min): 18 Transportation: Private Auto Blood Pressure (mmHg): 128/82 Schedule Follow-up Appointment: Yes Clinical Summary of Care: Provided on 04/08/2021 Form Type Recipient Paper Patient Patient Electronic Signature(s) Signed: 04/08/2021 1:19:11 PM By: Lorrin Jackson Previous Signature: 04/08/2021 1:17:48 PM Version By: Lorrin Jackson Entered By: Lorrin Jackson on 04/08/2021 13:19:10 -------------------------------------------------------------------------------- Lower Extremity Assessment Details Patient Name: Date of Service: [name] [name] ND, [name] 04/08/2021 12:30 PM Medical Record Number: [id number] Patient Account Number: [id number] Date of Birth/Sex: Treating RN: [birth year], [name] [name] (50 y.o. [name] [name] Primary Care Vincy Feliz: [name] Other Clinician: Referring Hadia Minier: Treating Dhara Schepp/Extender: Delano Metz in Treatment: 33 Edema Assessment Assessed: [name]: No] [name]: No] Edema: [Left: Ye] [Right: s] Calf Left: Right: Point of Measurement: 50 cm From Medial Instep 41.5 cm Ankle Left: Right: Point of Measurement: 11 cm From Medial Instep 26.5 cm Vascular Assessment Pulses: Dorsalis Pedis Palpable: [Left:Yes] Electronic Signature(s) Signed: 04/08/2021 5:35:12 PM By: Baruch Gouty RN, BSN Entered By: Baruch Gouty on 04/08/2021 12:50:58 -------------------------------------------------------------------------------- Multi Wound Chart Details Patient Name: Date of Service: [name] [name] ND, [name] 04/08/2021 12:30 PM Medical Record Number: [id number] Patient Account Number: [id number] Date of Birth/Sex: Treating RN: [birth year] (50 y.o. [name] [name], [name] [name] Primary Care Ahonesty Woodfin: [name] Other Clinician: Referring Anddy Wingert: Treating Calirose Mccance/Extender: Delano Metz in Treatment: 63 Vital Signs Height(in): 77 Capillary Blood Glucose(mg/dl): 82 Weight(lbs): 232 Pulse(bpm): 65 Body Mass Index(BMI): 28 Blood Pressure(mmHg): 128/82 Temperature(F): 98.2 Respiratory Rate(breaths/min): 18 Photos: [2R:No Photos Left Metatarsal head first] [3:No Photos Right, Posterior Knee] [N/A:N/A N/A] Wound Location: [2R:Blister] [3:Pressure Injury] [N/A:N/A] Wounding Event: [2R:Diabetic Wound/Ulcer of the Lower] [3:Pressure Ulcer] [N/A:N/A] Primary Etiology: [2R:Extremity Anemia, Hypertension, Peripheral] [3:Anemia, Hypertension, Peripheral] [N/A:N/A] Comorbid History: [2R:Venous Disease, Type II Diabetes 06/23/2020] [3:Venous Disease, Type II Diabetes 03/31/2021] [N/A:N/A] Date Acquired: [2R:33] [3:0] [N/A:N/A] Weeks of Treatment: [2R:Open] [3:Open] [N/A:N/A] Wound Status: [2R:Yes] [3:No] [N/A:N/A] Wound Recurrence: [2R:No] [3:Yes] [N/A:N/A] Clustered  Wound: [2R:N/A] [3:2] [N/A:N/A] Clustered Quantity: [2R:0.3x0.2x0.2] [3:0.9x2.3x0.1] [N/A:N/A] Measurements L x W x D (cm) [2R:0.047] [3:1.626] [N/A:N/A] A (cm) : rea [2R:0.009] [3:0.163] [N/A:N/A] Volume (cm) : [2R:-51.60%] [3:N/A] [N/A:N/A] % Reduction in A rea: [2R:-200.00%] [3:N/A] [N/A:N/A] % Reduction in Volume: [2R:12] Starting Position 1 (o'clock): [2R:12] Ending Position 1 (o'clock): [2R:0.3] Maximum Distance 1 (cm): [2R:Yes] [3:N/A] [N/A:N/A] Undermining: [2R:Grade 2] [3:Unstageable/Unclassified] [N/A:N/A] Classification: [2R:Small] [3:Small] [N/A:N/A] Exudate A mount: [2R:Serosanguineous] [3:Serous] [N/A:N/A] Exudate Type: [2R:red, brown] [3:amber] [N/A:N/A] Exudate Color: [2R:Well defined, not attached] [3:Flat and Intact] [N/A:N/A] Wound Margin: [2R:Large (67-100%)] [3:Small (1-33%)] [N/A:N/A] Granulation A mount: [2R:Pink, Pale] [3:Red] [N/A:N/A] Granulation Quality: [2R:None Present (0%)] [3:Large (67-100%)] [N/A:N/A] Necrotic A mount: [2R:N/A] [3:Eschar, Adherent  Slough] [N/A:N/A] Necrotic Tissue: [2R:Fat Layer (Subcutaneous Tissue): Yes Fat Layer (Subcutaneous Tissue): Yes N/A] Exposed Structures: [2R:Fascia: No Tendon: No Muscle: No Joint: No Bone: No Small (1-33%)] [3:Fascia: No Tendon: No Muscle: No Joint: No Bone: No Small (1-33%)] [N/A:N/A] Epithelialization: [2R:Debridement - Excisional] [3:N/A] [N/A:N/A] Debridement: Pre-procedure Verification/Time Out 13:04 [3:N/A] [N/A:N/A] Taken: [2R:Lidocaine] [3:N/A] [N/A:N/A] Pain Control: [2R:Callus, Subcutaneous, Slough] [3:N/A] [N/A:N/A] Tissue Debrided: [2R:Skin/Subcutaneous Tissue] [3:N/A] [N/A:N/A] Level: [2R:0.06] [3:N/A] [N/A:N/A] Debridement A (sq cm): [2R:rea Curette] [3:N/A] [N/A:N/A] Instrument: [2R:Minimum] [3:N/A] [N/A:N/A] Bleeding: [2R:Pressure] [3:N/A] [N/A:N/A] Hemostasis A chieved: [2R:0] [3:N/A] [N/A:N/A] Procedural Pain: [2R:0] [3:N/A] [N/A:N/A] Post Procedural Pain: [2R:Procedure was tolerated well] [3:N/A] [N/A:N/A] Debridement Treatment Response: [2R:0.3x0.2x0.2] [3:N/A] [N/A:N/A] Post Debridement Measurements L x W x D (cm) [2R:0.009] [3:N/A] [N/A:N/A] Post Debridement Volume: (cm) [2R:Debridement] [3:N/A] [N/A:N/A] Treatment Notes Wound #2R (Metatarsal head first) Wound Laterality: Left Cleanser Wound Cleanser Discharge Instruction: Cleanse the wound with wound cleanser prior to applying a clean dressing using gauze sponges, not tissue or cotton balls. Soap and Water Discharge Instruction: May shower and wash wound with dial antibacterial soap and water prior to dressing change. Peri-Wound Care Sween Lotion (Moisturizing lotion) Discharge Instruction: Apply moisturizing lotion as directed Topical Primary Dressing Promogran Prisma Matrix, 4.34 (sq in) (silver collagen) Discharge Instruction: Moisten collagen with saline or hydrogel Secondary Dressing Woven Gauze Sponges 2x2 in Discharge Instruction: Apply over primary dressing as directed. Optifoam Non-Adhesive  Dressing, 4x4 in Discharge Instruction: Apply over primary dressing cut to make foam donut to help offload Secured With [place] [facility] T ape, 2x2 (in/yd) Discharge Instruction: Secure dressing with tape as directed. Conforming Stretch Gauze Bandage, Sterile 2x75 (in/in) Discharge Instruction: Secure with stretch gauze as directed. Compression Wrap Compression Stockings Add-Ons Wound #3 (Knee) Wound Laterality: Right, Posterior Cleanser Wound Cleanser Discharge Instruction: Cleanse the wound with wound cleanser prior to applying a clean dressing using gauze sponges, not tissue or cotton balls. Soap and Water Discharge Instruction: May shower and wash wound with dial antibacterial soap and water prior to dressing change. Peri-Wound Care Topical Triple Antibiotic Ointment, 1 (oz) Tube Primary Dressing Secondary Dressing Woven Gauze Sponge, Non-Sterile 4x4 in Discharge Instruction: Apply over primary dressing as directed. ABD Pad, 5x9 Discharge Instruction: Apply over primary dressing as directed. Secured With [employer], 4.5x3.1 (in/yd) Discharge Instruction: Secure with Kerlix as directed. Compression Wrap Compression Stockings Add-Ons Electronic Signature(s) Signed: 04/08/2021 1:39:31 PM By: Kalman Shan DO Signed: 04/08/2021 5:09:03 PM By: Rhae Hammock RN Entered By: Kalman Shan on 04/08/2021 13:22:05 -------------------------------------------------------------------------------- Multi-Disciplinary Care Plan Details Patient Name: Date of Service: [name] [name] ND, [name] 04/08/2021 12:30 PM Medical Record Number: [id number] Patient Account Number: [id number] Date of Birth/Sex: Treating RN: [name] [name], [name] [name] (50 y.o. [name] [name] Primary Care Jashan Cotten: [name] Other Clinician: Referring  Morene Cecilio: Treating Ameris Akamine/Extender: Delano Metz in Treatment: [place] reviewed with  physician Active Inactive Wound/Skin Impairment Nursing Diagnoses: Impaired tissue integrity Goals: Patient/caregiver will verbalize understanding of skin care regimen Date Initiated: 11/19/2020 Target Resolution Date: 6/[name]/2022 Goal Status: Active Ulcer/skin breakdown will have a volume reduction of 30% by week 4 Date Initiated: 9/[name]/2021 Date Inactivated: 03/03/2021 Target Resolution Date: 03/10/2021 Goal Status: Met Interventions: Provide education on ulcer and skin care Notes: Electronic Signature(s) Signed: 04/08/2021 5:09:03 PM By: Rhae Hammock RN Entered By: Rhae Hammock on 04/08/2021 13:18:44 -------------------------------------------------------------------------------- Pain Assessment Details Patient Name: Date of Service: [name] [name] ND, [name] 04/08/2021 12:30 PM Medical Record Number: [id number] Patient Account Number: [id number] Date of Birth/Sex: Treating RN: [birth year][name] [name] (50 y.o. [name] [name] Primary Care Taya Ashbaugh: [name] Other Clinician: Referring Kymani Laursen: Treating Manaia Samad/Extender: Delano Metz in Treatment: 11 Active Problems Location of Pain Severity and Description of Pain Patient Has Paino No Site Locations Rate the pain. Current Pain Level: 0 Pain Management and Medication Current Pain Management: Electronic Signature(s) Signed: 04/08/2021 5:35:12 PM By: Baruch Gouty RN, BSN Entered By: Baruch Gouty on 04/08/2021 28:97:91 -------------------------------------------------------------------------------- Patient/Caregiver Education Details Patient Name: Date of Service: [name] [name] 5/17/2022andnbsp12:30 PM Medical Record Number: [id number] Patient Account Number: [id number] Date of Birth/Gender: Treating RN: [birth year] (50 y.o. [name] [name] Primary Care Physician: [name] Other Clinician: Referring Physician: Treating Physician/Extender: Delano Metz in Treatment: 19 Education Assessment Education Provided To: Patient Education Topics Provided Wound/Skin Impairment: Methods: Explain/Verbal Responses: State content correctly [employer]) Signed: 04/08/2021 5:09:03 PM By: Rhae Hammock RN Entered By: Rhae Hammock on 04/08/2021 13:06:58 -------------------------------------------------------------------------------- Wound Assessment Details Patient Name: Date of Service: [name] [name] ND, [name] 04/08/2021 12:30 PM Medical Record Number: [id number] Patient Account Number: [id number] Date of Birth/Sex: Treating RN: [name] [name][birth year] (50 y.o. [name] [name] Primary Care Roxas Clymer: [name] Other Clinician: Referring Leodis Alcocer: Treating Corey Laski/Extender: Delano Metz in Treatment: 33 Wound Status Wound Number: 2R Primary Diabetic Wound/Ulcer of the Lower Extremity Etiology: Wound Location: Left Metatarsal head first Wound Status: Open Wounding Event: Blister Comorbid Anemia, Hypertension, Peripheral Venous Disease, Type II Date Acquired: 06/23/2020 History: Diabetes Weeks Of Treatment: 33 Clustered Wound: No Photos Wound Measurements Length: (cm) 0.3 Width: (cm) 0.2 Depth: (cm) 0.2 Area: (cm) 0.047 Volume: (cm) 0.009 % Reduction in Area: -51.6% % Reduction in Volume: -200% Epithelialization: Small (1-33%) Tunneling: No Undermining: Yes Starting Position (o'clock): 12 Ending Position (o'clock): 12 Maximum Distance: (cm) 0.3 Wound Description Classification: Grade 2 Wound Margin: Well defined, not attached Exudate Amount: Small Exudate Type: Serosanguineous Exudate Color: red, brown Wound Bed Granulation Amount: Large (67-100%) Granulation Quality: Pink, Pale Necrotic Amount: None Present (0%) Foul Odor After Cleansing: No Slough/Fibrino No Exposed Structure Fascia Exposed: No Fat Layer (Subcutaneous Tissue) Exposed: Yes Tendon Exposed: No Muscle  Exposed: No Joint Exposed: No Bone Exposed: No Treatment Notes Wound #2R (Metatarsal head first) Wound Laterality: Left Cleanser Wound Cleanser Discharge Instruction: Cleanse the wound with wound cleanser prior to applying a clean dressing using gauze sponges, not tissue or cotton balls. Soap and Water Discharge Instruction: May shower and wash wound with dial antibacterial soap and water prior to dressing change. Peri-Wound Care Sween Lotion (Moisturizing lotion) Discharge Instruction: Apply moisturizing lotion as directed Topical Primary Dressing Promogran Prisma Matrix, 4.34 (sq in) (silver collagen) Discharge Instruction: Moisten collagen with saline or hydrogel Secondary Dressing Woven Gauze Sponges 2x2 in Discharge Instruction: Apply  over primary dressing as directed. Optifoam Non-Adhesive Dressing, 4x4 in Discharge Instruction: Apply over primary dressing cut to make foam donut to help offload Secured With [place] [facility] T ape, 2x2 (in/yd) Discharge Instruction: Secure dressing with tape as directed. Conforming Stretch Gauze Bandage, Sterile 2x75 (in/in) Discharge Instruction: Secure with stretch gauze as directed. Compression Wrap Compression Stockings Add-Ons Electronic Signature(s) Signed: 04/08/2021 5:35:12 PM By: Baruch Gouty RN, BSN Signed: 04/09/2021 11:07:31 AM By: Sandre Kitty Entered By: Sandre Kitty on 04/08/2021 16:51:09 -------------------------------------------------------------------------------- Wound Assessment Details Patient Name: Date of Service: [name] [name] ND, [name] 04/08/2021 12:30 PM Medical Record Number: [id number] Patient Account Number: [id number] Date of Birth/Sex: Treating RN: [name] [name][birth year] (49 y.o. [name] [name] Primary Care Quill Grinder: [name] Other Clinician: Referring Lively Haberman: Treating Hlee Fringer/Extender: Delano Metz in Treatment: 33 Wound Status Wound Number: 3  Primary Pressure Ulcer Etiology: Wound Location: Right, Posterior Knee Wound Status: Open Wounding Event: Pressure Injury Comorbid Anemia, Hypertension, Peripheral Venous Disease, Type II Date Acquired: 03/31/2021 History: Diabetes Weeks Of Treatment: 0 Clustered Wound: Yes Photos Wound Measurements Length: (cm) 0.9 Width: (cm) 2.3 Depth: (cm) 0.1 Clustered Quantity: 2 Area: (cm) 1.626 Volume: (cm) 0.163 % Reduction in Area: 0% % Reduction in Volume: 0% Epithelialization: Small (1-33%) Tunneling: No Wound Description Classification: Unstageable/Unclassified Wound Margin: Flat and Intact Exudate Amount: Small Exudate Type: Serous Exudate Color: amber Foul Odor After Cleansing: No Slough/Fibrino Yes Wound Bed Granulation Amount: Small (1-33%) Exposed Structure Granulation Quality: Red Fascia Exposed: No Necrotic Amount: Large (67-100%) Fat Layer (Subcutaneous Tissue) Exposed: Yes Necrotic Quality: Eschar, Adherent Slough Tendon Exposed: No Muscle Exposed: No Joint Exposed: No Bone Exposed: No Treatment Notes Wound #3 (Knee) Wound Laterality: Right, Posterior Cleanser Wound Cleanser Discharge Instruction: Cleanse the wound with wound cleanser prior to applying a clean dressing using gauze sponges, not tissue or cotton balls. Soap and Water Discharge Instruction: May shower and wash wound with dial antibacterial soap and water prior to dressing change. Peri-Wound Care Topical Triple Antibiotic Ointment, 1 (oz) Tube Primary Dressing Secondary Dressing Woven Gauze Sponge, Non-Sterile 4x4 in Discharge Instruction: Apply over primary dressing as directed. ABD Pad, 5x9 Discharge Instruction: Apply over primary dressing as directed. Secured With [employer], 4.5x3.1 (in/yd) Discharge Instruction: Secure with Kerlix as directed. Compression Wrap Compression Stockings Add-Ons Electronic Signature(s) Signed: 04/08/2021 5:35:12 PM By: Baruch Gouty RN,  BSN Signed: 04/09/2021 11:07:31 AM By: Sandre Kitty Entered By: Sandre Kitty on 04/08/2021 16:50:44 -------------------------------------------------------------------------------- Vitals Details Patient Name: Date of Service: [name], [name] 04/08/2021 12:30 PM Medical Record Number: [id number] Patient Account Number: [id number] Date of Birth/Sex: Treating RN: [birth year] (50 y.o. [name] [name] Primary Care Kritika Stukes: [name] Other Clinician: Referring Neldon Shepard: Treating Jshawn Hurta/Extender: Delano Metz in Treatment: 103 Vital Signs Time Taken: 12:44 Temperature (F): 98.2 Height (in): 77 Pulse (bpm): 80 Source: Stated Respiratory Rate (breaths/min): 18 Weight (lbs): 232 Blood Pressure (mmHg): 128/82 Source: Stated Capillary Blood Glucose (mg/dl): 82 Body Mass Index (BMI): 27.5 Reference Range: 80 - 120 mg / dl Notes glucose per pt report last night Electronic Signature(s) Signed: 04/08/2021 5:35:12 PM By: Baruch Gouty RN, BSN Entered By: Baruch Gouty on 04/08/2021 12:45:37

## 2021-04-09 NOTE — ED Provider Notes (Signed)
Emergency Medicine Provider Triage Evaluation Note  [name] [name] , a 50 y.o. male  was evaluated in triage.  Pt complains of fevers, chills, lightheadedness, nausea x 1 day. Not vaccinated against COVID-19. Tmax at home 105 degrees F.  Review of Systems  Positive: Fevers, chills, lightheadedness. Negative: V/D/dysuria, cough, SOB, HA  Physical Exam  BP (!) 148/96 (BP Location: Right Arm)   Pulse (!) 115   Temp (!) 101.9 F (38.8 C) (Oral)   Resp 16   SpO2 100%  Gen:   Awake, no distress   Resp:  Normal effort  MSK:   Moves extremities without difficulty  Other:  Tachycardic  Medical Decision Making  Medically screening exam initiated at 8:54 PM.  Appropriate orders placed.  [name] was informed that the remainder of the evaluation will be completed by another provider, this initial triage assessment does not replace that evaluation, and the importance of remaining in the ED until their evaluation is complete.  This chart was dictated using voice recognition software, Dragon. Despite the best efforts of this provider to proofread and correct errors, errors may still occur which can change documentation meaning.    Aura Dials 04/09/21 2056    Arnaldo Natal, MD 04/09/21 2113

## 2021-04-09 NOTE — Progress Notes (Signed)
Pharmacy Antibiotic Note  [name] [name] is a 50 y.o. male admitted on 04/09/2021 with cellulitis.  Pharmacy has been consulted for vancomycin dosing.  Plan: Vancomycin 2000 mg IV x 1, then 1250 mg IV q 24h Monitor renal function, clinical progression, LOT and ability to narrow Vancomycin levels as needed      Temp (24hrs), Avg:101.9 F (38.8 C), Min:101.9 F (38.8 C), Max:101.9 F (38.8 C)  Recent Labs  Lab 04/09/21 2104  WBC 15.4*  CREATININE 2.19*    CrCl cannot be calculated (Unknown ideal weight.).    Allergies  Allergen Reactions  . Adhesive [Tape] Rash    Rash at site of tape--okay to use paper tape    Bertis Ruddy, PharmD Clinical Pharmacist ED Pharmacist Phone # [phone] 04/09/2021 10:47 PM

## 2021-04-10 DIAGNOSIS — Z8371 Family history of colonic polyps: Secondary | ICD-10-CM | POA: Diagnosis not present

## 2021-04-10 DIAGNOSIS — Z89511 Acquired absence of right leg below knee: Secondary | ICD-10-CM

## 2021-04-10 DIAGNOSIS — N179 Acute kidney failure, unspecified: Secondary | ICD-10-CM | POA: Diagnosis present

## 2021-04-10 DIAGNOSIS — R6520 Severe sepsis without septic shock: Secondary | ICD-10-CM

## 2021-04-10 DIAGNOSIS — L03116 Cellulitis of left lower limb: Secondary | ICD-10-CM | POA: Diagnosis not present

## 2021-04-10 DIAGNOSIS — L03115 Cellulitis of right lower limb: Secondary | ICD-10-CM | POA: Diagnosis present

## 2021-04-10 DIAGNOSIS — Z818 Family history of other mental and behavioral disorders: Secondary | ICD-10-CM | POA: Diagnosis not present

## 2021-04-10 DIAGNOSIS — Z7984 Long term (current) use of oral hypoglycemic drugs: Secondary | ICD-10-CM | POA: Diagnosis not present

## 2021-04-10 DIAGNOSIS — D72829 Elevated white blood cell count, unspecified: Secondary | ICD-10-CM | POA: Diagnosis not present

## 2021-04-10 DIAGNOSIS — Z20822 Contact with and (suspected) exposure to covid-19: Secondary | ICD-10-CM | POA: Diagnosis present

## 2021-04-10 DIAGNOSIS — I10 Essential (primary) hypertension: Secondary | ICD-10-CM | POA: Diagnosis present

## 2021-04-10 DIAGNOSIS — Z79899 Other long term (current) drug therapy: Secondary | ICD-10-CM | POA: Diagnosis not present

## 2021-04-10 DIAGNOSIS — N189 Chronic kidney disease, unspecified: Secondary | ICD-10-CM | POA: Diagnosis present

## 2021-04-10 DIAGNOSIS — Z86010 Personal history of colonic polyps: Secondary | ICD-10-CM | POA: Diagnosis not present

## 2021-04-10 DIAGNOSIS — E785 Hyperlipidemia, unspecified: Secondary | ICD-10-CM | POA: Diagnosis present

## 2021-04-10 DIAGNOSIS — A419 Sepsis, unspecified organism: Principal | ICD-10-CM | POA: Diagnosis present

## 2021-04-10 DIAGNOSIS — L0390 Cellulitis, unspecified: Secondary | ICD-10-CM | POA: Diagnosis present

## 2021-04-10 DIAGNOSIS — F419 Anxiety disorder, unspecified: Secondary | ICD-10-CM | POA: Diagnosis present

## 2021-04-10 DIAGNOSIS — F32A Depression, unspecified: Secondary | ICD-10-CM | POA: Diagnosis present

## 2021-04-10 DIAGNOSIS — D649 Anemia, unspecified: Secondary | ICD-10-CM | POA: Diagnosis present

## 2021-04-10 DIAGNOSIS — K219 Gastro-esophageal reflux disease without esophagitis: Secondary | ICD-10-CM | POA: Diagnosis present

## 2021-04-10 DIAGNOSIS — Z833 Family history of diabetes mellitus: Secondary | ICD-10-CM | POA: Diagnosis not present

## 2021-04-10 DIAGNOSIS — Z91048 Other nonmedicinal substance allergy status: Secondary | ICD-10-CM | POA: Diagnosis not present

## 2021-04-10 DIAGNOSIS — F17210 Nicotine dependence, cigarettes, uncomplicated: Secondary | ICD-10-CM | POA: Diagnosis present

## 2021-04-10 DIAGNOSIS — E1140 Type 2 diabetes mellitus with diabetic neuropathy, unspecified: Secondary | ICD-10-CM | POA: Diagnosis present

## 2021-04-10 LAB — URINALYSIS, ROUTINE W REFLEX MICROSCOPIC
Bilirubin Urine: NEGATIVE
Glucose, UA: >=500 mg/dL — AB
Ketones, ur: NEGATIVE mg/dL
Leukocytes,Ua: NEGATIVE
Nitrite: NEGATIVE
Protein, ur: >=300 mg/dL — AB
Specific Gravity, Urine: 1.018 (ref 1.005–1.030)
pH: 5 (ref 5.0–8.0)

## 2021-04-10 LAB — BASIC METABOLIC PANEL
Anion gap: 9 (ref 5–15)
BUN: 27 mg/dL — ABNORMAL HIGH (ref 6–20)
CO2: 21 mmol/L — ABNORMAL LOW (ref 22–32)
Calcium: 8.4 mg/dL — ABNORMAL LOW (ref 8.9–10.3)
Chloride: 110 mmol/L (ref 98–111)
Creatinine, Ser: 2.05 mg/dL — ABNORMAL HIGH (ref 0.61–1.24)
GFR, Estimated: 39 mL/min — ABNORMAL LOW (ref >60)
Glucose, Bld: 201 mg/dL — ABNORMAL HIGH (ref 70–99)
Potassium: 4 mmol/L (ref 3.5–5.1)
Sodium: 140 mmol/L (ref 135–145)

## 2021-04-10 LAB — GLUCOSE, CAPILLARY
Glucose-Capillary: 168 mg/dL — ABNORMAL HIGH (ref 70–99)
Glucose-Capillary: 165 mg/dL — ABNORMAL HIGH (ref 70–99)
Glucose-Capillary: 208 mg/dL — ABNORMAL HIGH (ref 70–99)

## 2021-04-10 LAB — CBC
HCT: 30.5 % — ABNORMAL LOW (ref 39.0–52.0)
Hemoglobin: 9.5 g/dL — ABNORMAL LOW (ref 13.0–17.0)
MCH: 28.4 pg (ref 26.0–34.0)
MCHC: 31.1 g/dL (ref 30.0–36.0)
MCV: 91.3 fL (ref 80.0–100.0)
Platelets: 200 10*3/uL (ref 150–400)
RBC: 3.34 MIL/uL — ABNORMAL LOW (ref 4.22–5.81)
RDW: 14.8 % (ref 11.5–15.5)
WBC: 13.8 10*3/uL — ABNORMAL HIGH (ref 4.0–10.5)
nRBC: 0 % (ref 0.0–0.2)

## 2021-04-10 LAB — LACTIC ACID, PLASMA: Lactic Acid, Venous: 1 mmol/L (ref 0.5–1.9)

## 2021-04-10 LAB — CBG MONITORING, ED: Glucose-Capillary: 138 mg/dL — ABNORMAL HIGH (ref 70–99)

## 2021-04-10 LAB — HEMOGLOBIN A1C
Hgb A1c MFr Bld: 8.1 % — ABNORMAL HIGH (ref 4.8–5.6)
Mean Plasma Glucose: 185.77 mg/dL

## 2021-04-10 LAB — SEDIMENTATION RATE: Sed Rate: 68 mm/hr — ABNORMAL HIGH (ref 0–16)

## 2021-04-10 LAB — HIV ANTIBODY (ROUTINE TESTING W REFLEX): HIV Screen 4th Generation wRfx: NONREACTIVE

## 2021-04-10 MED ORDER — INSULIN ASPART 100 UNIT/ML IJ SOLN
0.0000 [IU] | Freq: Three times a day (TID) | INTRAMUSCULAR | Status: DC
  Administered 2021-04-10 (×2): 2 [IU] via SUBCUTANEOUS
  Administered 2021-04-10 – 2021-04-11 (×2): 1 [IU] via SUBCUTANEOUS
  Administered 2021-04-11: 3 [IU] via SUBCUTANEOUS
  Administered 2021-04-11 – 2021-04-12 (×2): 1 [IU] via SUBCUTANEOUS

## 2021-04-10 MED ORDER — SODIUM CHLORIDE 0.9 % IV SOLN
2.0000 g | Freq: Two times a day (BID) | INTRAVENOUS | Status: DC
  Administered 2021-04-10: 2 g via INTRAVENOUS
  Filled 2021-04-10: qty 2

## 2021-04-10 MED ORDER — GABAPENTIN 300 MG PO CAPS: 300.0000 mg | ORAL_CAPSULE | Freq: Three times a day (TID) | ORAL | Status: DC

## 2021-04-10 MED ORDER — DAPAGLIFLOZIN PROPANEDIOL 10 MG PO TABS: 10.0000 mg | ORAL_TABLET | Freq: Every day | ORAL | Status: DC

## 2021-04-10 MED ORDER — GABAPENTIN 300 MG PO CAPS: 300.0000 mg | ORAL_CAPSULE | Freq: Two times a day (BID) | ORAL | Status: DC

## 2021-04-10 MED ORDER — ONDANSETRON HCL 4 MG PO TABS: 4.0000 mg | ORAL_TABLET | Freq: Four times a day (QID) | ORAL | Status: DC | PRN

## 2021-04-10 MED ORDER — ONDANSETRON HCL 4 MG PO TABS: 8.0000 mg | ORAL_TABLET | Freq: Three times a day (TID) | ORAL | Status: DC | PRN

## 2021-04-10 MED ORDER — DAPAGLIFLOZIN PROPANEDIOL 10 MG PO TABS
10.0000 mg | ORAL_TABLET | Freq: Every day | ORAL | Status: DC
  Administered 2021-04-10 – 2021-04-12 (×3): 10 mg via ORAL
  Filled 2021-04-10 (×3): qty 1

## 2021-04-10 MED ORDER — GABAPENTIN 300 MG PO CAPS: 300.0000 mg | ORAL_CAPSULE | ORAL | Status: DC

## 2021-04-10 MED ORDER — FAMOTIDINE 20 MG PO TABS
20.0000 mg | ORAL_TABLET | Freq: Two times a day (BID) | ORAL | Status: DC
  Administered 2021-04-10 – 2021-04-12 (×5): 20 mg via ORAL
  Filled 2021-04-10 (×5): qty 1

## 2021-04-10 MED ORDER — HEPARIN SODIUM (PORCINE) 5000 UNIT/ML IJ SOLN
5000.0000 [IU] | Freq: Three times a day (TID) | INTRAMUSCULAR | Status: DC
  Administered 2021-04-10 – 2021-04-12 (×6): 5000 [IU] via SUBCUTANEOUS
  Filled 2021-04-10 (×6): qty 1

## 2021-04-10 MED ORDER — INSULIN DETEMIR 100 UNIT/ML ~~LOC~~ SOLN
5.0000 [IU] | Freq: Every day | SUBCUTANEOUS | Status: DC
  Administered 2021-04-10 – 2021-04-11 (×2): 5 [IU] via SUBCUTANEOUS
  Filled 2021-04-10 (×3): qty 0.05

## 2021-04-10 MED ORDER — GABAPENTIN 300 MG PO CAPS
600.0000 mg | ORAL_CAPSULE | Freq: Every day | ORAL | Status: DC
  Administered 2021-04-11: 600 mg via ORAL
  Filled 2021-04-10: qty 2

## 2021-04-10 MED ORDER — AMLODIPINE BESYLATE 10 MG PO TABS
10.0000 mg | ORAL_TABLET | Freq: Every morning | ORAL | Status: DC
  Administered 2021-04-10 – 2021-04-12 (×3): 10 mg via ORAL
  Filled 2021-04-10: qty 1
  Filled 2021-04-10: qty 2
  Filled 2021-04-10: qty 1

## 2021-04-10 MED ORDER — GABAPENTIN 300 MG PO CAPS: 600.0000 mg | ORAL_CAPSULE | Freq: Every day | ORAL | Status: DC

## 2021-04-10 MED ORDER — ARIPIPRAZOLE 10 MG PO TABS
20.0000 mg | ORAL_TABLET | Freq: Every morning | ORAL | Status: DC
  Administered 2021-04-10 – 2021-04-12 (×3): 20 mg via ORAL
  Filled 2021-04-10 (×3): qty 2

## 2021-04-10 MED ORDER — AMITRIPTYLINE HCL 50 MG PO TABS
150.0000 mg | ORAL_TABLET | Freq: Every day | ORAL | Status: DC
  Filled 2021-04-10 (×3): qty 3

## 2021-04-10 MED ORDER — METOCLOPRAMIDE HCL 10 MG PO TABS: 5.0000 mg | ORAL_TABLET | Freq: Three times a day (TID) | ORAL | Status: DC

## 2021-04-10 MED ORDER — INSULIN ASPART 100 UNIT/ML IJ SOLN: 0.0000 [IU] | Freq: Three times a day (TID) | INTRAMUSCULAR | Status: DC

## 2021-04-10 MED ORDER — GABAPENTIN 300 MG PO CAPS
300.0000 mg | ORAL_CAPSULE | Freq: Three times a day (TID) | ORAL | Status: DC
  Administered 2021-04-11 – 2021-04-12 (×5): 300 mg via ORAL
  Filled 2021-04-10 (×5): qty 1

## 2021-04-10 MED ORDER — ACETAMINOPHEN 325 MG PO TABS
650.0000 mg | ORAL_TABLET | Freq: Four times a day (QID) | ORAL | Status: DC | PRN
  Administered 2021-04-10 – 2021-04-12 (×2): 650 mg via ORAL
  Filled 2021-04-10 (×2): qty 2

## 2021-04-10 MED ORDER — INSULIN ASPART 100 UNIT/ML IJ SOLN
0.0000 [IU] | Freq: Every day | INTRAMUSCULAR | Status: DC
  Administered 2021-04-10 – 2021-04-11 (×2): 2 [IU] via SUBCUTANEOUS

## 2021-04-10 MED ORDER — ATORVASTATIN CALCIUM 10 MG PO TABS
10.0000 mg | ORAL_TABLET | Freq: Every day | ORAL | Status: DC
  Administered 2021-04-10 – 2021-04-11 (×2): 10 mg via ORAL
  Filled 2021-04-10 (×2): qty 1

## 2021-04-10 MED ORDER — ACETAMINOPHEN 650 MG RE SUPP: 650.0000 mg | Freq: Four times a day (QID) | RECTAL | Status: DC | PRN

## 2021-04-10 MED ORDER — INSULIN ASPART 100 UNIT/ML IJ SOLN: 0.0000 [IU] | Freq: Every day | INTRAMUSCULAR | Status: DC

## 2021-04-10 MED ORDER — DULOXETINE HCL 60 MG PO CPEP
60.0000 mg | ORAL_CAPSULE | Freq: Two times a day (BID) | ORAL | Status: DC
  Administered 2021-04-10 – 2021-04-12 (×4): 60 mg via ORAL
  Filled 2021-04-10 (×4): qty 1

## 2021-04-10 MED ORDER — BISACODYL 5 MG PO TBEC
5.0000 mg | DELAYED_RELEASE_TABLET | Freq: Every day | ORAL | Status: DC | PRN
  Filled 2021-04-10: qty 1

## 2021-04-10 MED ORDER — ONDANSETRON HCL 4 MG/2ML IJ SOLN: 4.0000 mg | Freq: Four times a day (QID) | INTRAMUSCULAR | Status: DC | PRN

## 2021-04-10 MED ORDER — INSULIN ASPART 100 UNIT/ML IJ SOLN
3.0000 [IU] | Freq: Three times a day (TID) | INTRAMUSCULAR | Status: DC
  Administered 2021-04-10 – 2021-04-12 (×8): 3 [IU] via SUBCUTANEOUS

## 2021-04-10 MED ORDER — FLUTICASONE PROPIONATE 50 MCG/ACT NA SUSP: 2.0000 | Freq: Every day | NASAL | Status: DC | PRN

## 2021-04-10 MED ORDER — DOCUSATE SODIUM 100 MG PO CAPS: 200.0000 mg | ORAL_CAPSULE | Freq: Every day | ORAL | Status: DC | PRN

## 2021-04-10 MED ORDER — DULAGLUTIDE 0.75 MG/0.5ML ~~LOC~~ SOAJ: 0.7500 mg | SUBCUTANEOUS | Status: DC

## 2021-04-10 MED ORDER — POLYETHYLENE GLYCOL 3350 17 G PO PACK
17.0000 g | PACK | Freq: Every day | ORAL | Status: DC
  Administered 2021-04-12: 17 g via ORAL
  Filled 2021-04-10 (×3): qty 1

## 2021-04-10 NOTE — Progress Notes (Signed)
Patient educated about using continuous pulse oxygen monitor and verbalized understanding but refused at this time. Patient educated about TED hoes and verbalized understanding but refused to wear at this time. Patient educated about bed height and safety reasons for keeping bed low and patient verbalized understanding and states he will take caution when getting up and adjusting bed by himself but refused to keep bed low.

## 2021-04-10 NOTE — Plan of Care (Signed)

## 2021-04-10 NOTE — ED Notes (Signed)
Unable to get sample from patients wound at this time due to it being dry. Will try obtain specimen later. Provider at bedside and aware.

## 2021-04-10 NOTE — Progress Notes (Signed)
Code Sepsis initiated @ 4000 PM, [place] following.

## 2021-04-10 NOTE — Progress Notes (Signed)
Pharmacy Antibiotic Note  [name] [name] is a 50 y.o. male admitted on 04/09/2021 with cellulitis meeting SIRS criteria.  Pharmacy has been consulted to add cefepime to vancomycin already started.  Plan: Cefepime 2g IV Q12H.  Weight: 104.3 kg (230 lb)  Temp (24hrs), Avg:101.1 F (38.4 C), Min:100.2 F (37.9 C), Max:101.9 F (38.8 C)  Recent Labs  Lab 04/09/21 2104 04/09/21 2223  WBC 15.4*  --   CREATININE 2.19*  --   LATICACIDVEN  --  1.5    Estimated Creatinine Clearance: 52 mL/min (A) (by C-G formula based on SCr of 2.19 mg/dL (H)).    Allergies  Allergen Reactions  . Adhesive [Tape] Rash    Rash at site of tape--okay to use paper tape     Thank you for allowing pharmacy to be a part of this patient's care.  Wynona Neat, PharmD, BCPS  04/10/2021 2:29 AM

## 2021-04-10 NOTE — Evaluation (Signed)
Physical Therapy Evaluation Patient Details Name: [name] [name] MRN: [id number] DOB: 06/09/1971 Today's Date: 04/10/2021   History of Present Illness  [name] [name] is a 50 y.o. male with medical history significant for hypertension, right BKA, noninsulin dependent diabetes, hyperlipidemia, CKD 2, presents to the ED for chief concern of fever.     He reports the fever was 105.0 F at home.  He reports feeling generalized malaise.  He reports that he has been having a wound in the back of his knee on the right side for approximately 1 week.  Clinical Impression  Patient received dressed, standing at bedside getting ready to walk into bathroom. Patient reports he does not feel that he needs therapy at this time. Patient observed ambulating in room. Reports some soreness behind right knee and difficulty donning prosthetic due to bandage, however he was able to don it independently. Patient appears to be at baseline level of function. Will sign off at this time.       Follow Up Recommendations No PT follow up    Equipment Recommendations  None recommended by PT    Recommendations for Other Services       Precautions / Restrictions Precautions Precaution Comments: low fall Restrictions Weight Bearing Restrictions: No      Mobility  Bed Mobility Overal bed mobility: Modified Independent             General bed mobility comments: use of bed rails    Transfers Overall transfer level: Independent Equipment used: None             General transfer comment: patient received in room standing at bedside  Ambulation/Gait Ambulation/Gait assistance: Modified independent (Device/Increase time) Gait Distance (Feet): 30 Feet Assistive device: None Gait Pattern/deviations: Step-through pattern;Decreased step length - right;Decreased step length - left;Trunk flexed Gait velocity: decr   General Gait Details: patient ambulated independently into bathroom and back to bed.  Reports difficulty donning prosthetic due to bandage on right knee, but able to do so  Stairs            Wheelchair Mobility    Modified Rankin (Stroke Patients Only)       Balance Overall balance assessment: Mild deficits observed, not formally tested                                           Pertinent Vitals/Pain Pain Assessment: Faces Faces Pain Scale: Hurts a little bit Pain Location: back of right knee Pain Descriptors / Indicators: Discomfort Pain Intervention(s): Monitored during session    Home Living Family/patient expects to be discharged to:: Private residence Living Arrangements: Parent Available Help at Discharge: Family;Available 24 hours/day Type of Home: House Home Access: Stairs to enter Entrance Stairs-Rails: Can reach both;Right;Left Entrance Stairs-Number of Steps: 3 Home Layout: One level Home Equipment: Walker - 2 wheels      Prior Function Level of Independence: Independent               Hand Dominance        Extremity/Trunk Assessment   Upper Extremity Assessment Upper Extremity Assessment: Overall WFL for tasks assessed    Lower Extremity Assessment Lower Extremity Assessment: Overall WFL for tasks assessed    Cervical / Trunk Assessment Cervical / Trunk Assessment: Normal  Communication   Communication: No difficulties  Cognition Arousal/Alertness: Awake/alert Behavior During Therapy: Flat affect Overall Cognitive Status:  Within Functional Limits for tasks assessed                                        General Comments      Exercises     Assessment/Plan    PT Assessment Patent does not need any further PT services  PT Problem List         PT Treatment Interventions      PT Goals (Current goals can be found in the Care Plan section)  Acute Rehab PT Goals Patient Stated Goal: none stated PT Goal Formulation: With patient Time For Goal Achievement: 04/17/21     Frequency     Barriers to discharge        Co-evaluation               AM-PAC PT "6 Clicks" Mobility  Outcome Measure Help needed turning from your back to your side while in a flat bed without using bedrails?: None Help needed moving from lying on your back to sitting on the side of a flat bed without using bedrails?: None Help needed moving to and from a bed to a chair (including a wheelchair)?: None Help needed standing up from a chair using your arms (e.g., wheelchair or bedside chair)?: None Help needed to walk in hospital room?: None Help needed climbing 3-5 steps with a railing? : None 6 Click Score: 24    End of Session   Activity Tolerance: Patient tolerated treatment well Patient left: in bed Nurse Communication: Mobility status PT Visit Diagnosis: Other abnormalities of gait and mobility (R26.89)    Time: 1435-1445 PT Time Calculation (min) (ACUTE ONLY): 10 min   Charges:   PT Evaluation $PT Eval Low Complexity: 1 Low          Jedediah Noda, PT, GCS 04/10/21,2:54 PM

## 2021-04-10 NOTE — Progress Notes (Signed)
Elink Code Sepsis Completion Note;  Pt had BC drawn before ABX administered. LA 1.5. Only received 1600 ml IVF replacement for allotted 3,129 ml. Admitted with cellulitis & SIRS. Being admitted to Med Surgical unit for further monitoring.   Marky Buresh DNP [place] RN [phone] AM

## 2021-04-10 NOTE — Progress Notes (Signed)
[facility] PROGRESS NOTE    Progress Note  [name] [name]  QIW:[id number] DOB: [birth year] DOA: 04/09/2021 PCP: [name], NP     Brief Narrative:   [name] [name] is an 50 y.o. 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 Wound care has been consulted. He was fluid resuscitated and sepsis physiology seems to be resolving tachycardia is improved he has defervesced. Culture data has been sent. Right knee x-ray showed below the knee amputation with no signs of osteomyelitis.  Non-insulin diabetes mellitus type 2: Will hold all oral hypoglycemic agents. Start long-acting insulin twice a day plus sliding scale.  Acute kidney injury: With a baseline creatinine of 1.0 on admission.  Likely prerenal azotemia in the setting of sepsis. Was started on IV fluid hydration recheck a basic metabolic panel in the morning.  Essential hypertension: Continue amlodipine hold ACE inhibitor and diuretic therapy in the setting of acute kidney injury.  Hyperlipidemia: Continue statins.  Psychiatric imbalance/anxiety/depression: Resume Elavil Abilify and duloxetine.  Peripheral neuropathy: Will reduce gabapentin dose in the setting of acute kidney injury probably resume it tomorrow morning.    DVT prophylaxis: lovenox Family Communication:non Status is: Observation  The patient will require care spanning > 2 midnights and should be moved to  inpatient because: Hemodynamically unstable  Dispo: The patient is from: Home              Anticipated d/c is to: Home              Patient currently is not medically stable to d/c.   Difficult to place patient No        Code Status:     Code Status Orders  (From admission, onward)         Start     Ordered   04/10/21 0205  Full code  Continuous        04/10/21 0206        Code Status History    Date Active Date Inactive Code Status Order ID Comments User Context   11/24/2017 0159 11/30/2017 1402 Full Code [id number]  Norval Morton, MD ED   10/11/2017 2256 10/22/2017 1317 Full Code [id number]  Rise Patience, MD ED   Advance Care Planning Activity        IV Access:    Peripheral IV   Procedures and diagnostic studies:   DG Chest Port 1 View  Result Date: 04/09/2021 CLINICAL DATA:  Fever with dizziness and nausea. EXAM: PORTABLE CHEST 1 VIEW COMPARISON:  December 18, 2020 FINDINGS: The heart size and mediastinal contours are within normal limits. Both lungs are clear. No acute osseous abnormalities are identified. IMPRESSION: No active disease. Electronically Signed   By: Virgina Norfolk M.D.   On: 04/09/2021 22:41   DG Knee Complete 4 Views Right  Result Date: 04/09/2021 CLINICAL DATA:  Open wound to the back of the right knee with fever. EXAM: RIGHT KNEE - COMPLETE 4+  VIEW COMPARISON:  None. FINDINGS: There is evidence of below the knee surgical amputation of the right leg. No evidence of fracture, dislocation, or joint effusion. No focal bone abnormality is seen. A tiny linear soft tissue defect is seen along the lateral aspect of the surgical stump site. IMPRESSION: Below the knee surgical amputation of the right leg, without an acute osseous abnormality. Electronically Signed   By: Virgina Norfolk M.D.   On: 04/09/2021 22:45     Medical Consultants:    None.   Subjective:    [name] [name] relates he feels about the same has not had any  fevers anorexic.  Objective:    Vitals:   04/10/21 0200 04/10/21 0343 04/10/21 0600 04/10/21 0700  BP: (!) 141/90 (!) 143/88 (!) 157/89 139/88  Pulse: 91 84 83 82  Resp: 16 18  16   Temp:  98.4 F (36.9 C)    TempSrc:  Oral    SpO2: 97% 98% 97% 97%  Weight:       SpO2: 97 %   Intake/Output Summary (Last 24 hours) at 04/10/2021 0733 Last data filed at 04/10/2021 0552 Gross per 24 hour  Intake --  Output 350 ml  Net -350 ml   Filed Weights   04/09/21 2249  Weight: 104.3 kg    Exam: General exam: In no acute distress. Respiratory system: Good air movement and clear to auscultation. Cardiovascular system: S1 & S2 heard, RRR. No JVD. Gastrointestinal system: Abdomen is nondistended, soft and nontender.  Extremities: Right below the knee amputation.   Skin: Erythematous area in the popliteal fossa no fluctuation, tender to touch, there is also a small drainage in the left toe plantar area. Psychiatry: Judgement and insight appear normal. Mood & affect appropriate.    Data Reviewed:    Labs: Basic Metabolic Panel: Recent Labs  Lab 04/09/21 2104  NA 138  K 4.3  CL 110  CO2 23  GLUCOSE 201*  BUN 21*  CREATININE 2.19*  CALCIUM 8.3*   GFR Estimated Creatinine Clearance: 52 mL/min (A) (by C-G formula based on SCr of 2.19 mg/dL (H)). Liver Function Tests: No results for input(s): AST, ALT, ALKPHOS, BILITOT, PROT, ALBUMIN in the last 168 hours. No results for input(s): LIPASE, AMYLASE in the last 168 hours. No results for input(s): AMMONIA in the last 168 hours. Coagulation profile Recent Labs  Lab 04/09/21 2223  INR 1.0   COVID-19 Labs  No results for input(s): DDIMER, FERRITIN, LDH, CRP in the last 72 hours.  Lab Results  Component Value Date   [place] NEGATIVE 04/09/2021    CBC: Recent Labs  Lab 04/09/21 2104  WBC 15.4*  NEUTROABS 13.7*  HGB 9.4*  HCT 31.2*  MCV 92.9  PLT 198   Cardiac Enzymes: No results for input(s): CKTOTAL, CKMB,  CKMBINDEX, TROPONINI in the last 168 hours. BNP (last 3 results) No results for input(s): PROBNP in the last 8760 hours. CBG: No results for input(s): GLUCAP in the last 168 hours. D-Dimer: No results for input(s): DDIMER in the last 72 hours. Hgb A1c: No results for input(s): HGBA1C in the last 72 hours. Lipid Profile: No results for input(s): CHOL, HDL, LDLCALC, TRIG, CHOLHDL, LDLDIRECT in the last 72 hours. Thyroid function studies: No results for input(s): TSH, T4TOTAL, T3FREE, THYROIDAB in the last 72 hours.  Invalid input(s): FREET3 Anemia work up: No results for input(s): VITAMINB12, FOLATE, FERRITIN, TIBC, IRON, RETICCTPCT in the last 72 hours. Sepsis Labs: Recent Labs  Lab 04/09/21 2104 04/09/21  2223  WBC 15.4*  --   LATICACIDVEN  --  1.5   Microbiology Recent Results (from the past 240 hour(s))  Resp Panel by RT-PCR (Flu A&B, Covid) Nasopharyngeal Swab     Status: None   Collection Time: 04/09/21  8:57 PM   Specimen: Nasopharyngeal Swab; Nasopharyngeal(NP) swabs in vial transport medium  Result Value Ref Range Status   SARS Coronavirus 2 by RT PCR NEGATIVE NEGATIVE Final    Comment: (NOTE) SARS-CoV-2 target nucleic acids are NOT DETECTED.  The SARS-CoV-2 RNA is generally detectable in upper respiratory specimens during the acute phase of infection. The lowest concentration of SARS-CoV-2 viral copies this assay can detect is 138 copies/mL. A negative result does not preclude SARS-Cov-2 infection and should not be used as the sole basis for treatment or other patient management decisions. A negative result may occur with  improper specimen collection/handling, submission of specimen other than nasopharyngeal swab, presence of viral mutation(s) within the areas targeted by this assay, and inadequate number of viral copies(<138 copies/mL). A negative result must be combined with clinical observations, patient history, and epidemiological information. The expected  result is Negative.  Fact Sheet for Patients:  EntrepreneurPulse.com.au  Fact Sheet for Healthcare Providers:  IncredibleEmployment.be  This test is no t yet approved or cleared by the [place] FDA and  has been authorized for detection and/or diagnosis of SARS-CoV-2 by FDA under an Emergency Use Authorization (EUA). This EUA will remain  in effect (meaning this test can be used) for the duration of the COVID-19 declaration under Section 564(b)(1) of the Act, 21 U.S.C.section 360bbb-3(b)(1), unless the authorization is terminated  or revoked sooner.       Influenza A by PCR NEGATIVE NEGATIVE Final   Influenza B by PCR NEGATIVE NEGATIVE Final    Comment: (NOTE) The Xpert Xpress SARS-CoV-2/FLU/RSV plus assay is intended as an aid in the diagnosis of influenza from Nasopharyngeal swab specimens and should not be used as a sole basis for treatment. Nasal washings and aspirates are unacceptable for Xpert Xpress SARS-CoV-2/FLU/RSV testing.  Fact Sheet for Patients: EntrepreneurPulse.com.au  Fact Sheet for Healthcare Providers: IncredibleEmployment.be  This test is not yet approved or cleared by the [place] FDA and has been authorized for detection and/or diagnosis of SARS-CoV-2 by FDA under an Emergency Use Authorization (EUA). This EUA will remain in effect (meaning this test can be used) for the duration of the COVID-19 declaration under Section 564(b)(1) of the Act, 21 U.S.C. section 360bbb-3(b)(1), unless the authorization is terminated or revoked.  Performed at [facility], [place] [address], [place], [place] [address]      Medications:   . amitriptyline  150 mg Oral QHS  . amLODipine  10 mg Oral q morning  . ARIPiprazole  20 mg Oral q morning  . atorvastatin  10 mg Oral QHS  . dapagliflozin propanediol  10 mg Oral Daily  . [START ON 04/11/2021] Dulaglutide  0.75 mg Subcutaneous  Weekly  . DULoxetine  60 mg Oral BID  . famotidine  20 mg Oral BID  . gabapentin  300 mg Oral TID WC   And  . gabapentin  600 mg Oral QHS  . heparin  5,000 Units Subcutaneous Q8H  . insulin aspart  0-5 Units Subcutaneous QHS  . insulin aspart  0-9 Units Subcutaneous TID WC  . polyethylene glycol  17 g Oral Daily   Continuous Infusions: . sodium chloride    . ceFEPime (MAXIPIME) IV Stopped (04/10/21 8295)  .  lactated ringers 150 mL/hr at 04/10/21 0147      LOS: 0 days   Charlynne Cousins  [facility]  04/10/2021, 7:33 AM

## 2021-04-10 NOTE — Consult Note (Addendum)
[place] Nurse Consult Note: Patient currently receiving care in the [facility] Reason for Consult: right posterior knee, plantar aspect of the left foot.  Wound type: Full thickness wound on the right posterior knee related to (according to patient) wearing his prosthesis. Today this wound is 100 % yellow surrounded by erythema and is painful per patient. This dressing has been completed for today.  Plantar aspect of the left foot is calloused and has been debrided and treated with silver collagen. Today this wound is calloused, with no drainage but is crusted. Painted with betadine.  Left leg has an alligator appearance, very dry.  Dressing procedure/placement/frequency: Clean the right posterior right knee with NS and apply a strip of Xeroform gauze, cover with 4 x 4 and wrap with kerlex. If the patient wound like to put the prosthesis on, a foam dressing may be placed over the Xeroform and left unwrapped. Change daily.  Left plantar aspect of the foot: clean the foot with soap and water, rinse and dry. Paint the area with betadine and allow to air dry. Apply daily.  Left leg cleans with soap and water, dry and apply Sween moisturizing cream (pink and white tube in clean utility).  Monitor the wound area(s) for worsening of condition such as: Signs/symptoms of infection, increase in size, development of or worsening of odor, development of pain, or increased pain at the affected locations.   Notify the medical team if any of these develop.  Thank you for the consult. [place] nurse will not follow at this time.   Please re-consult the [facility] team if needed.  Cathlean Marseilles Tamala Julian, MSN, RN, [place], [place]le Pearl, [facility] Wound Treatment Associate Pager [phone]

## 2021-04-10 NOTE — H&P (Signed)
History and Physical   [name] [name]:[id number] DOB: 04-23-1971 DOA: 04/09/2021  PCP: [name], NP  Outpatient Specialists: Dr. Kalman Shan, wound specialist Patient coming from: home   I have personally briefly reviewed patient's old medical records in [place].  Chief Concern: fever  HPI: [name] [name] is a 50 y.o. male with medical history significant for hypertension, right BKA, noninsulin dependent diabetes, hyperlipidemia, CKD 2, presents to the ED for chief concern of fever.  He reports the fever was 105.0 F at home.  He reports feeling generalized malaise.  He reports that he has been having a wound in the back of his knee on the right side for approximately 1 week.  He denies bug bites and traumatic injury.  He states that his sleep has been rubbing in the back of his knee and that is the result of the wound.  He endorses feeling suprapubic tenderness with deep palpation that started about 2 days ago.  He also endorses decreased urine output.  He feels like he wants to urinate but has difficulty urinating.  He denies dysuria and hematuria.  Social history: He currently lives at home with his mother.  He is a tobacco user and currently smokes half a pack per day.  He started smoking at age 21, and at his peak he was smoking 1.5 packs/day.  He denies EtOH and recreational drug use.  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   Vaccinations: Patient is vaccinated for COVID-19 with 3 doses of Pfizer  ROS: Constitutional: no weight change, + fever ENT/Mouth: no sore throat, no rhinorrhea Eyes: no eye pain, no vision changes Cardiovascular: no chest pain, no dyspnea,  no edema, no palpitations Respiratory: no cough, no sputum, no wheezing Gastrointestinal: no nausea, no vomiting, no diarrhea, no constipation Genitourinary: + Suprapubic tenderness, no dysuria, no hematuria Musculoskeletal: no arthralgias, no myalgias Skin: no skin  lesions, no pruritus, Neuro: + weakness, no loss of consciousness, no syncope Psych: no anxiety, no depression, + decrease appetite Heme/Lymph: no bruising, no bleeding  ED Course: Discussed with ED provider, patient requiring hospitalization for cellulitis.  Vitals in the emergency department was remarkable for T-max of 101.9, respiration rate of 16, heart rate 92, blood pressure 137/86, SPO2 of 97% on room air.  Lactic acid initially was 1.5.  Labs in the emergency department was remarkable for WBC elevated at 15.4, hemoglobin 9.4, platelets 198, sodium 138, potassium 4.3, chloride 110, bicarb 23, BUN 21, serum creatinine of 2.19, nonfasting blood glucose 201.  Assessment/Plan  Principal Problem:   Cellulitis Active Problems:   Benign essential HTN   Leukocytosis   History of right below knee amputation (HCC)   Severe sepsis (HCC)   Meets severe sepsis criteria with renal involvement Cellulitis is a source - Increased heart rate, fever, elevated WBC, renal involvement -Status post Vanco and Zosyn per EDP - Vancomycin per pharmacy - Cefepime per pharmacy - Checking MRSA PCR, sed rate, CRP -At bedside patient is able to flex and extend his knee without difficulty, low clinical suspicion for osteomyelitis and/or septic joint at this time - Wound care consulted, images uploaded to media -Patient is maintaining MAP -Status post normal saline 1 L bolus per EDP, lactated Ringer's 150 mL/h  Non-insulin-dependent diabetes mellitus -Glipizide has not been resumed -For CIWA 10 mg daily resumed - Dulaglutide 0.75 mg subcutaneous q. weekly resumed for every Friday -Insulin sliding scale for renal and at bedtime coverage ordered  Hypertension-resumed home amlodipine 10 mg every morning  Hyperlipidemia-atorvastatin 10 mg nightly  Anxiety/depression/psychiatric imbalance-resumed home amitriptyline 150 mg nightly, Abilify 20 mg every morning, duloxetine 60 mg twice daily  p.o.  GERD-famotidine 20 mg twice daily  Peripheral neuropathy- gabapentin per home administration dosing resumed  Bilateral lower and upper extremity dryness- Per patient this is hereditary as everyone in his family has this, recommend outpatient follow-up with dermatology -This appears beyond peripheral arterial disease  A.m. team to complete med reconciliation  As needed medications: Dulcolax, ondansetron, acetaminophen  Chart reviewed.   DVT prophylaxis: Heparin 5000 units 3 times daily subcutaneous Code Status: Full code Diet: Heart healthy/carb modified Family Communication: No Disposition Plan: Pending clinical course Consults called: None at this time Admission status: MedSurg, observation, no telemetry ordered  Past Medical History:  Diagnosis Date  . Allergy    seasonal and environmental  . ARF (acute renal failure) ([place]) 09/2017  . Chronic constipation 05/13/2020  . Depression   . Diabetes mellitus without complication ([facility]) 3500  . Hyperlipidemia   . Hypertension   . Necrotizing fasciitis ([place]) 10/13/2017  . Neuromuscular disorder (HCC)    neuropathy  . Wound dehiscence 11/24/2017   Past Surgical History:  Procedure Laterality Date  . AMPUTATION Right 10/13/2017   Procedure: RIGHT BELOW KNEE AMPUTATION;  Surgeon: [name], MD;  Location: [facility];  Service: Orthopedics;  Laterality: Right;  . AMPUTATION Right 11/24/2017   Procedure: AMPUTATION BELOW KNEE REVISION;  Surgeon: [name], MD;  Location: [facility];  Service: Orthopedics;  Laterality: Right;  . COLONOSCOPY  05/11/2019  . NO PAST SURGERIES    . POLYPECTOMY    . WISDOM TOOTH EXTRACTION     Social History:  reports that he has been smoking cigarettes. He has a 8.75 pack-year smoking history. He has never used smokeless tobacco. He reports that he does not drink alcohol and does not use drugs.  Allergies  Allergen Reactions  . Adhesive [Tape] Rash    Rash at site of tape--okay to use paper tape    Family History  Problem Relation Age of Onset  . Diabetes Mellitus II Mother   . Depression Mother   . Colon polyps Mother   . Heart disease Father        CABG x 4.  04/2017  . Colon cancer Neg Hx   . Esophageal cancer Neg Hx   . Liver cancer Neg Hx   . Pancreatic cancer Neg Hx   . Rectal cancer Neg Hx   . Stomach cancer Neg Hx    Family history: Family history reviewed and not pertinent  Prior to Admission medications   Medication Sig Start Date End Date Taking? Authorizing Provider  Truddie Crumble ULTRA-THIN LANCETS MISC Check blood sugars before meals twice daily 11/19/17   Mack Hook, MD  amitriptyline (ELAVIL) 100 MG tablet Take 200 mg by mouth at bedtime.  01/10/19   [provider]  amLODipine (NORVASC) 10 MG tablet Take 10 mg by mouth every morning.     [provider]  ARIPiprazole (ABILIFY) 20 MG tablet Take 20 mg by mouth every morning. 08/06/19   [provider]  atorvastatin (LIPITOR) 10 MG tablet Take 10 mg by mouth every morning. 06/07/19   [provider]  baclofen (LIORESAL) 10 MG tablet 1/2 tab twice a day Patient taking differently: Take 20 mg by mouth 2 (two) times daily. 01/28/18   Mack Hook, MD  bisacodyl (DULCOLAX) 5 MG EC tablet Take 5  mg by mouth daily as needed for moderate constipation.    [provider]  Blood Glucose Monitoring Suppl (AGAMATRIX PRESTO) w/Device KIT Check sugars twice daily before meals 11/19/17   Mack Hook, MD  Cholecalciferol-Vitamin C (VITAMIN D3-VITAMIN C PO) Take 1 tablet by mouth at bedtime.    [provider]  desvenlafaxine (PRISTIQ) 100 MG 24 hr tablet Take 100 mg by mouth daily. 02/20/20   [provider]  docusate sodium (COLACE) 100 MG capsule Take 1 capsule (100 mg total) by mouth 2 (two) times daily. Patient taking differently: Take 200 mg by mouth daily as needed. 11/30/17   Buriev, Arie Sabina, MD  Dulaglutide (TRULICITY) 8.82 CM/0.3KJ SOPN  Inject 0.75 mg into the skin once a week. 03/21/21   Renato Shin, MD  DULoxetine (CYMBALTA) 60 MG capsule Take 60 mg by mouth 2 (two) times daily.  01/04/19   [provider]  famotidine (PEPCID) 20 MG tablet Take 1 tablet (20 mg total) by mouth 2 (two) times daily. 07/04/20   Tasia Catchings, Deontaye Civello V, PA-C  FARXIGA 5 MG TABS tablet Take 5 mg by mouth daily. 04/08/20   [provider]  fluticasone (FLONASE) 50 MCG/ACT nasal spray Place 2 sprays into both nostrils daily as needed for allergies or rhinitis.  01/09/19   [provider]  gabapentin (NEURONTIN) 300 MG capsule 1 cap by mouth in morning and midday, 2 caps by mouth at bedtime Patient taking differently: Take 300-600 mg by mouth See admin instructions. Take one capsule (300 mg) by mouth in the morning and afternoon, take two capsules (600 mg) at bedtime 11/19/17   Mack Hook, MD  glipiZIDE (GLUCOTROL) 5 MG tablet 1/2 tab by mouth twice daily with meal Patient taking differently: 5 mg. Pt states change to 50m tid with meals 11/19/17   MMack Hook MD  glucose blood (AGAMATRIX PRESTO TEST) test strip Check blood sugars twice daily before meals 02/02/18   MMack Hook MD  lisinopril (ZESTRIL) 20 MG tablet Take 20 mg by mouth 2 (two) times daily. 02/29/20   [provider]  metFORMIN (GLUCOPHAGE-XR) 500 MG 24 hr tablet Take 4 tablets (2,000 mg total) by mouth daily with breakfast. 03/21/21   ERenato Shin MD  metoCLOPramide (REGLAN) 5 MG tablet Take 1 tablet (5 mg total) by mouth 3 (three) times daily before meals for 10 days. 07/04/20 07/14/20  YOk Edwards PA-C  olmesartan-hydrochlorothiazide (BENICAR HCT) 40-12.5 MG tablet Take 1 tablet by mouth daily. 03/21/20   [provider]  ondansetron (ZOFRAN) 8 MG tablet Take 8 mg by mouth every 8 (eight) hours as needed. 12/01/19   [provider]  polyethylene glycol (MIRALAX / GLYCOLAX) 17 g packet Take 17 g by mouth daily. 04/28/19   DDoran Stabler MD   Physical Exam: Vitals:   04/10/21 0015 04/10/21 0023 04/10/21 0100 04/10/21 0200  BP: (!) 154/86  135/82 (!) 141/90  Pulse: (!) 102 100 98 91  Resp: _0 Temp:      TempSrc:      SpO2: 98% 98% 98% 97%  Weight:       Constitutional: appears older than chronological age, NAD, calm, comfortable Eyes: PERRL, lids and conjunctivae normal ENMT: Mucous membranes are moist. Posterior pharynx clear of any exudate or lesions. Age-appropriate dentition. Hearing appropriate Neck: normal, supple, no masses, no thyromegaly Respiratory: clear to auscultation bilaterally, no wheezing, no crackles. Normal respiratory effort. No accessory muscle use.  Cardiovascular: Regular rate  and rhythm, no murmurs / rubs / gallops. No extremity edema. 2+ pedal pulses. No carotid bruits.  Abdomen: no tenderness, no masses palpated, no hepatosplenomegaly. Bowel sounds positive.  Musculoskeletal: no clubbing / cyanosis.  Right BKA.  Good ROM, no contractures, no atrophy. Normal muscle tone.  Skin: Posterior knee cellulitis and lesion.  Bilateral lower and upper extremities dryness Neurologic: Sensation intact. Strength 5/5 in all 4.  Psychiatric: Normal judgment and insight. Alert and oriented x 3. Normal mood.   EKG: independently reviewed, showing sinus tachycardia with rate of 111, LVH, QTC 418  Chest x-ray on Admission: I personally reviewed and I agree with radiologist reading as below.  DG Chest Port 1 View  Result Date: 04/09/2021 CLINICAL DATA:  Fever with dizziness and nausea. EXAM: PORTABLE CHEST 1 VIEW COMPARISON:  December 18, 2020 FINDINGS: The heart size and mediastinal contours are within normal limits. Both lungs are clear. No acute osseous abnormalities are identified. IMPRESSION: No active disease. Electronically Signed   By: Virgina Norfolk M.D.   On: 04/09/2021 22:41   DG Knee Complete 4 Views Right  Result Date: 04/09/2021 CLINICAL DATA:  Open wound to the back of the right  knee with fever. EXAM: RIGHT KNEE - COMPLETE 4+ VIEW COMPARISON:  None. FINDINGS: There is evidence of below the knee surgical amputation of the right leg. No evidence of fracture, dislocation, or joint effusion. No focal bone abnormality is seen. A tiny linear soft tissue defect is seen along the lateral aspect of the surgical stump site. IMPRESSION: Below the knee surgical amputation of the right leg, without an acute osseous abnormality. Electronically Signed   By: Virgina Norfolk M.D.   On: 04/09/2021 22:45   Labs on Admission: I have personally reviewed following labs  CBC: Recent Labs  Lab 04/09/21 2104  WBC 15.4*  NEUTROABS 13.7*  HGB 9.4*  HCT 31.2*  MCV 92.9  PLT 696   Basic Metabolic Panel: Recent Labs  Lab 04/09/21 2104  NA 138  K 4.3  CL 110  CO2 23  GLUCOSE 201*  BUN 21*  CREATININE 2.19*  CALCIUM 8.3*   GFR: Estimated Creatinine Clearance: 52 mL/min (A) (by C-G formula based on SCr of 2.19 mg/dL (H)).  Coagulation Profile: Recent Labs  Lab 04/09/21 2223  INR 1.0   Urine analysis:    Component Value Date/Time   COLORURINE YELLOW 04/10/2021 0044   APPEARANCEUR CLEAR 04/10/2021 0044   LABSPEC 1.018 04/10/2021 0044   PHURINE 5.0 04/10/2021 0044   GLUCOSEU >=500 (A) 04/10/2021 0044   HGBUR SMALL (A) 04/10/2021 0044   BILIRUBINUR NEGATIVE 04/10/2021 0044   KETONESUR NEGATIVE 04/10/2021 0044   PROTEINUR >=300 (A) 04/10/2021 0044   NITRITE NEGATIVE 04/10/2021 0044   LEUKOCYTESUR NEGATIVE 04/10/2021 0044   Edrik Rundle N Favour Aleshire D.O. [facility]  If 7PM-7AM, please contact overnight-coverage provider If 7AM-7PM, please contact day coverage provider [email or web address]  04/10/2021, 2:08 AM

## 2021-04-11 DIAGNOSIS — N179 Acute kidney failure, unspecified: Secondary | ICD-10-CM | POA: Diagnosis not present

## 2021-04-11 DIAGNOSIS — L03116 Cellulitis of left lower limb: Secondary | ICD-10-CM | POA: Diagnosis not present

## 2021-04-11 DIAGNOSIS — A419 Sepsis, unspecified organism: Secondary | ICD-10-CM | POA: Diagnosis not present

## 2021-04-11 DIAGNOSIS — L03115 Cellulitis of right lower limb: Secondary | ICD-10-CM | POA: Diagnosis not present

## 2021-04-11 LAB — URINE CULTURE: Culture: NO GROWTH

## 2021-04-11 LAB — HIGH SENSITIVITY CRP: CRP, High Sensitivity: 49.14 mg/L — ABNORMAL HIGH (ref 0.00–3.00)

## 2021-04-11 LAB — BASIC METABOLIC PANEL
Anion gap: 6 (ref 5–15)
BUN: 24 mg/dL — ABNORMAL HIGH (ref 6–20)
CO2: 23 mmol/L (ref 22–32)
Calcium: 8.4 mg/dL — ABNORMAL LOW (ref 8.9–10.3)
Chloride: 108 mmol/L (ref 98–111)
Creatinine, Ser: 1.92 mg/dL — ABNORMAL HIGH (ref 0.61–1.24)
GFR, Estimated: 42 mL/min — ABNORMAL LOW (ref >60)
Glucose, Bld: 129 mg/dL — ABNORMAL HIGH (ref 70–99)
Potassium: 3.9 mmol/L (ref 3.5–5.1)
Sodium: 137 mmol/L (ref 135–145)

## 2021-04-11 LAB — GLUCOSE, CAPILLARY
Glucose-Capillary: 128 mg/dL — ABNORMAL HIGH (ref 70–99)
Glucose-Capillary: 227 mg/dL — ABNORMAL HIGH (ref 70–99)
Glucose-Capillary: 139 mg/dL — ABNORMAL HIGH (ref 70–99)
Glucose-Capillary: 220 mg/dL — ABNORMAL HIGH (ref 70–99)

## 2021-04-11 MED ORDER — SODIUM CHLORIDE 0.9 % IV SOLN
INTRAVENOUS | Status: AC
  Administered 2021-04-11: 1000 mL via INTRAVENOUS

## 2021-04-11 NOTE — Progress Notes (Signed)
[facility] PROGRESS NOTE    Progress Note  [name] [name]  BOF:[id number] DOB: October 14, 1971 DOA: 04/09/2021 PCP: [name], NP     Brief Narrative:   [name] [name] is an 50 y.o. 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 Wound care has been consulted. Has remained afebrile leukocytosis improving. Blood cultures are still negative till date. We will continue IV empiric antibiotics for 1 additional day.  Non-insulin diabetes mellitus type 2: Continue to hold oral hypoglycemic agents blood glucose fairly controlled continue sliding scale insulin and long-acting insulin.  Acute kidney injury: With a baseline creatinine of 1.0 on admission.  Likely prerenal continue IV fluids for 1 additional day.  Essential hypertension: Continue amlodipine hold ACE inhibitor and diuretic therapy in the setting of acute kidney injury.  Hyperlipidemia: Continue statins.  Psychiatric imbalance/anxiety/depression: Resume Elavil Abilify and duloxetine.  Peripheral neuropathy: Will reduce gabapentin dose in the setting of acute kidney injury probably resume it tomorrow morning.    DVT prophylaxis: lovenox Family Communication:non Status is: Observation  The patient will require care spanning > 2 midnights and should be moved to inpatient because: Hemodynamically unstable  Dispo: The patient is from: Home              Anticipated d/c is to:  Home              Patient currently is not medically stable to d/c.   Difficult to place patient No        Code Status:     Code Status Orders  (From admission, onward)         Start     Ordered   04/10/21 0205  Full code  Continuous        04/10/21 0206        Code Status History    Date Active Date Inactive Code Status Order ID Comments User Context   11/24/2017 0159 11/30/2017 1402 Full Code [id number]  Norval Morton, MD ED   10/11/2017 2256 10/22/2017 1317 Full Code [id number]  Rise Patience, MD ED   Advance Care Planning Activity        IV Access:    Peripheral IV   Procedures and diagnostic studies:   DG Chest Port 1 View  Result Date: 04/09/2021 CLINICAL DATA:  Fever with dizziness and nausea. EXAM: PORTABLE CHEST 1 VIEW COMPARISON:  December 18, 2020 FINDINGS: The heart size and mediastinal contours are within normal limits. Both lungs are clear. No acute osseous abnormalities are identified. IMPRESSION: No active disease. Electronically Signed   By: Virgina Norfolk M.D.   On: 04/09/2021 22:41   DG Knee Complete 4 Views Right  Result Date: 04/09/2021 CLINICAL DATA:  Open wound to the back of the right knee with fever. EXAM: RIGHT KNEE - COMPLETE 4+ VIEW COMPARISON:  None. FINDINGS: There is evidence of below the knee surgical amputation of the right leg. No evidence of fracture, dislocation, or  joint effusion. No focal bone abnormality is seen. A tiny linear soft tissue defect is seen along the lateral aspect of the surgical stump site. IMPRESSION: Below the knee surgical amputation of the right leg, without an acute osseous abnormality. Electronically Signed   By: Virgina Norfolk M.D.   On: 04/09/2021 22:45     Medical Consultants:    None.   Subjective:    [name] [name] relates his appetite has returned.  Objective:    Vitals:   04/10/21 1620 04/10/21 2004 04/11/21 0500 04/11/21 0755  BP: (!) 152/91 (!) 158/92 (!) 169/98 (!)  169/95  Pulse: 84 90 88 91  Resp: 17  18 16   Temp: 98.4 F (36.9 C) 98.1 F (36.7 C) 98.1 F (36.7 C) 98.4 F (36.9 C)  TempSrc: Oral  Oral Oral  SpO2: 96% 98% 98% 96%  Weight:       SpO2: 96 %   Intake/Output Summary (Last 24 hours) at 04/11/2021 0958 Last data filed at 04/10/2021 1800 Gross per 24 hour  Intake 240 ml  Output --  Net 240 ml   Filed Weights   04/09/21 2249  Weight: 104.3 kg    Exam: General exam: In no acute distress. Respiratory system: Good air movement and clear to auscultation. Cardiovascular system: S1 & S2 heard, RRR. No JVD. Gastrointestinal system: Abdomen is nondistended, soft and nontender.  Extremities: No pedal edema. Skin: No rashes, lesions or ulcers Psychiatry: Judgement and insight appear normal. Mood & affect appropriate.   Data Reviewed:    Labs: Basic Metabolic Panel: Recent Labs  Lab 04/09/21 2104 04/10/21 1121 04/11/21 0332  NA 138 140 137  K 4.3 4.0 3.9  CL 110 110 108  CO2 23 21* 23  GLUCOSE 201* 201* 129*  BUN 21* 27* 24*  CREATININE 2.19* 2.05* 1.92*  CALCIUM 8.3* 8.4* 8.4*   GFR Estimated Creatinine Clearance: 59.3 mL/min (A) (by C-G formula based on SCr of 1.92 mg/dL (H)). Liver Function Tests: No results for input(s): AST, ALT, ALKPHOS, BILITOT, PROT, ALBUMIN in the last 168 hours. No results for input(s): LIPASE, AMYLASE in the last 168 hours. No results for input(s): AMMONIA in the last 168 hours. Coagulation profile Recent Labs  Lab 04/09/21 2223  INR 1.0   COVID-19 Labs  No results for input(s): DDIMER, FERRITIN, LDH, CRP in the last 72 hours.  Lab Results  Component Value Date   [place] NEGATIVE 04/09/2021    CBC: Recent Labs  Lab 04/09/21 2104 04/10/21 1121  WBC 15.4* 13.8*  NEUTROABS 13.7*  --   HGB 9.4* 9.5*  HCT 31.2* 30.5*  MCV 92.9 91.3  PLT 198 200   Cardiac Enzymes: No results for input(s): CKTOTAL, CKMB, CKMBINDEX, TROPONINI in the last 168 hours. BNP (last 3  results) No results for input(s): PROBNP in the last 8760 hours. CBG: Recent Labs  Lab 04/10/21 0802 04/10/21 1210 04/10/21 1619 04/10/21 2009 04/11/21 0640  GLUCAP 138* 168* 165* 208* 128*   D-Dimer: No results for input(s): DDIMER in the last 72 hours. Hgb A1c: Recent Labs    04/10/21 1228  HGBA1C 8.1*   Lipid Profile: No results for input(s): CHOL, HDL, LDLCALC, TRIG, CHOLHDL, LDLDIRECT in the last 72 hours. Thyroid function studies: No results for input(s): TSH, T4TOTAL, T3FREE, THYROIDAB in the last 72 hours.  Invalid input(s): FREET3 Anemia work up: No results for input(s): VITAMINB12, FOLATE, FERRITIN, TIBC, IRON, RETICCTPCT in the last 72 hours. Sepsis Labs: Recent Labs  Lab 04/09/21 2104  04/09/21 2223 04/10/21 1121 04/10/21 1228  WBC 15.4*  --  13.8*  --   LATICACIDVEN  --  1.5  --  1.0   Microbiology Recent Results (from the past 240 hour(s))  Blood culture (routine x 2)     Status: None (Preliminary result)   Collection Time: 04/09/21 10:36 AM   Specimen: BLOOD  Result Value Ref Range Status   Specimen Description BLOOD LEFT ANTECUBITAL  Final   Special Requests Blood Culture adequate volume  Final   Culture   Final    NO GROWTH 1 DAY Performed at [facility], [place] [address], [place], [place] [address]    Report Status PENDING  Incomplete  Resp Panel by RT-PCR (Flu A&B, Covid) Nasopharyngeal Swab     Status: None   Collection Time: 04/09/21  8:57 PM   Specimen: Nasopharyngeal Swab; Nasopharyngeal(NP) swabs in vial transport medium  Result Value Ref Range Status   SARS Coronavirus 2 by RT PCR NEGATIVE NEGATIVE Final    Comment: (NOTE) SARS-CoV-2 target nucleic acids are NOT DETECTED.  The SARS-CoV-2 RNA is generally detectable in upper respiratory specimens during the acute phase of infection. The lowest concentration of SARS-CoV-2 viral copies this assay can detect is 138 copies/mL. A negative result does not preclude  SARS-Cov-2 infection and should not be used as the sole basis for treatment or other patient management decisions. A negative result may occur with  improper specimen collection/handling, submission of specimen other than nasopharyngeal swab, presence of viral mutation(s) within the areas targeted by this assay, and inadequate number of viral copies(<138 copies/mL). A negative result must be combined with clinical observations, patient history, and epidemiological information. The expected result is Negative.  Fact Sheet for Patients:  EntrepreneurPulse.com.au  Fact Sheet for Healthcare Providers:  IncredibleEmployment.be  This test is no t yet approved or cleared by the [place] FDA and  has been authorized for detection and/or diagnosis of SARS-CoV-2 by FDA under an Emergency Use Authorization (EUA). This EUA will remain  in effect (meaning this test can be used) for the duration of the COVID-19 declaration under Section 564(b)(1) of the Act, 21 U.S.C.section 360bbb-3(b)(1), unless the authorization is terminated  or revoked sooner.       Influenza A by PCR NEGATIVE NEGATIVE Final   Influenza B by PCR NEGATIVE NEGATIVE Final    Comment: (NOTE) The Xpert Xpress SARS-CoV-2/FLU/RSV plus assay is intended as an aid in the diagnosis of influenza from Nasopharyngeal swab specimens and should not be used as a sole basis for treatment. Nasal washings and aspirates are unacceptable for Xpert Xpress SARS-CoV-2/FLU/RSV testing.  Fact Sheet for Patients: EntrepreneurPulse.com.au  Fact Sheet for Healthcare Providers: IncredibleEmployment.be  This test is not yet approved or cleared by the [place] FDA and has been authorized for detection and/or diagnosis of SARS-CoV-2 by FDA under an Emergency Use Authorization (EUA). This EUA will remain in effect (meaning this test can be used) for the duration of  the COVID-19 declaration under Section 564(b)(1) of the Act, 21 U.S.C. section 360bbb-3(b)(1), unless the authorization is terminated or revoked.  Performed at [facility], [place] [address], [place], [place] [address]   Urine culture     Status: None   Collection Time: 04/09/21 10:23 PM   Specimen: In/Out Cath Urine  Result Value Ref Range Status   Specimen Description IN/OUT CATH URINE  Final   Special Requests NONE  Final   Culture   Final  NO GROWTH Performed at [facility], [place] [address] [place], [place] [address]    Report Status 04/11/2021 FINAL  Final  Blood culture (routine x 2)     Status: None (Preliminary result)   Collection Time: 04/10/21 12:37 PM   Specimen: BLOOD  Result Value Ref Range Status   Specimen Description BLOOD RIGHT ANTECUBITAL  Final   Special Requests   Final    BOTTLES DRAWN AEROBIC AND ANAEROBIC Blood Culture adequate volume   Culture   Final    NO GROWTH < 24 HOURS Performed at [facility], [place] [address], [place], [place] [address]    Report Status PENDING  Incomplete     Medications:   . amitriptyline  150 mg Oral QHS  . amLODipine  10 mg Oral q morning  . ARIPiprazole  20 mg Oral q morning  . atorvastatin  10 mg Oral QHS  . dapagliflozin propanediol  10 mg Oral Daily  . Dulaglutide  0.75 mg Subcutaneous Weekly  . DULoxetine  60 mg Oral BID  . famotidine  20 mg Oral BID  . gabapentin  600 mg Oral QHS   And  . gabapentin  300 mg Oral TID WC  . heparin  5,000 Units Subcutaneous Q8H  . insulin aspart  0-5 Units Subcutaneous QHS  . insulin aspart  0-9 Units Subcutaneous TID WC  . insulin aspart  3 Units Subcutaneous TID WC  . insulin detemir  5 Units Subcutaneous QHS  . polyethylene glycol  17 g Oral Daily   Continuous Infusions:     LOS: 1 day   Charlynne Cousins  [facility]  04/11/2021, 9:58 AM

## 2021-04-11 NOTE — Plan of Care (Signed)

## 2021-04-12 DIAGNOSIS — A419 Sepsis, unspecified organism: Secondary | ICD-10-CM | POA: Diagnosis not present

## 2021-04-12 DIAGNOSIS — N179 Acute kidney failure, unspecified: Secondary | ICD-10-CM | POA: Diagnosis not present

## 2021-04-12 DIAGNOSIS — L03115 Cellulitis of right lower limb: Secondary | ICD-10-CM | POA: Diagnosis not present

## 2021-04-12 DIAGNOSIS — D72829 Elevated white blood cell count, unspecified: Secondary | ICD-10-CM | POA: Diagnosis not present

## 2021-04-12 LAB — BASIC METABOLIC PANEL WITH GFR
Anion gap: 8 (ref 5–15)
BUN: 20 mg/dL (ref 6–20)
CO2: 25 mmol/L (ref 22–32)
Calcium: 8.4 mg/dL — ABNORMAL LOW (ref 8.9–10.3)
Chloride: 106 mmol/L (ref 98–111)
Creatinine, Ser: 1.83 mg/dL — ABNORMAL HIGH (ref 0.61–1.24)
GFR, Estimated: 45 mL/min — ABNORMAL LOW
Glucose, Bld: 131 mg/dL — ABNORMAL HIGH (ref 70–99)
Potassium: 3.7 mmol/L (ref 3.5–5.1)
Sodium: 139 mmol/L (ref 135–145)

## 2021-04-12 LAB — GLUCOSE, CAPILLARY
Glucose-Capillary: 145 mg/dL — ABNORMAL HIGH (ref 70–99)
Glucose-Capillary: 226 mg/dL — ABNORMAL HIGH (ref 70–99)

## 2021-04-12 MED ORDER — HYDROCODONE-ACETAMINOPHEN 5-325 MG PO TABS
1.0000 | ORAL_TABLET | ORAL | Status: DC | PRN
  Administered 2021-04-12 (×2): 2 via ORAL
  Filled 2021-04-12 (×2): qty 2

## 2021-04-12 MED ORDER — DOXYCYCLINE HYCLATE 100 MG PO TABS: 100.0000 mg | ORAL_TABLET | Freq: Two times a day (BID) | ORAL | 0 refills | Status: DC

## 2021-04-12 MED ORDER — LISINOPRIL 20 MG PO TABS: 20.0000 mg | ORAL_TABLET | Freq: Every day | ORAL | Status: DC

## 2021-04-12 MED ORDER — SODIUM CHLORIDE 0.9 % IV SOLN
100.0000 mg | Freq: Two times a day (BID) | INTRAVENOUS | Status: DC
  Administered 2021-04-12: 100 mg via INTRAVENOUS
  Filled 2021-04-12: qty 100

## 2021-04-12 NOTE — Discharge Summary (Signed)
Physician Discharge Summary  [name] [name] ZYY:[id number] DOB: [birth year] DOA: 04/09/2021  PCP: [name], NP  Admit date: 04/09/2021 Discharge date: 04/12/2021  Admitted From: Home Disposition:  Home  Recommendations for Outpatient Follow-up:  1. Follow up with PCP in 1-2 weeks 2. Please obtain BMP/CBC in one week   Home Health:No Equipment/Devices:None  Discharge Condition:Stable CODE STATUS:Full Diet recommendation: Heart Healthy   Brief/Interim Summary: 50 y.o. 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 last hemoglobin A1c of 8.0, current smoker about a pack and a half a day, chronic kidney disease stage II comes into the ED, for fever 105.0 at home, with malaise with a wound in the back of her knee for approximately 1 week which is tender to palpation which started about 2 days prior to admission  Discharge Diagnoses:  Principal Problem:   Cellulitis Active Problems:   Sepsis ([place])   Benign essential HTN   Leukocytosis   History of right below knee amputation ([place])   Severe sepsis ([place])   AKI (acute kidney injury) ([place])   Cellulitis of both lower extremities  Severe sepsis secondary to cellulitis of the right popliteal fossa: X-ray was done that showed no osteomyelitis she was started empirically on IV vancomycin and cefepime. Wound care was consulted who dressed the wound. Leukocytosis improved he defervesced blood cultures remain negative till date. He was transitioned to IV doxycycline which should continue as an outpatient for total of 5 days.  Non-insulin-dependent diabetes mellitus type 2: No changes made to his medication continue current home regimen.  Acute kidney injury: With a baseline creatinine of around 1, likely prerenal azotemia. He was started on IV fluids and his creatinine improved.  Essential hypertension Continue amlodipine holding ACE inhibitor and diuretic as an  outpatient for 1 week and resume as an outpatient in 1 week.  Anxiety/depression: Continue Abilify, Elavil and duloxetine as an outpatient.  Diabetic neuropathy: Resume her Neurontin as an outpatient at his home dose.  Discharge Instructions  Discharge Instructions    Diet - low sodium heart healthy   Complete by: As directed    Increase activity slowly   Complete by: As directed    No wound care   Complete by: As directed      Allergies as of 04/12/2021      Reactions   Adhesive [tape] Rash   Rash at site of tape--okay to use paper tape      Medication List    TAKE these medications   AgaMatrix Presto w/Device Kit Check sugars twice daily before meals   AgaMatrix Ultra-Thin Lancets Misc Check blood sugars before meals twice daily   amLODipine 10 MG tablet Commonly known as: NORVASC Take 10 mg by mouth every morning.   ARIPiprazole 20 MG tablet Commonly known as: ABILIFY Take 20 mg by mouth every morning.   atorvastatin 20 MG tablet Commonly known as: LIPITOR Take 20 mg by mouth every morning.   baclofen 10 MG tablet Commonly known as: LIORESAL 1/2 tab twice a day What changed:   how much to take  how to take this  when to take this  additional instructions   bisacodyl 5 MG EC tablet Commonly known as: DULCOLAX Take 5 mg by mouth daily as needed for moderate constipation.   dapagliflozin propanediol 10 MG Tabs tablet Commonly known as: FARXIGA Take 10 mg by mouth daily.   desvenlafaxine 100 MG 24 hr tablet Commonly known as: PRISTIQ Take 100  mg by mouth daily.   docusate sodium 100 MG capsule Commonly known as: COLACE Take 1 capsule (100 mg total) by mouth 2 (two) times daily. What changed:   how much to take  when to take this  reasons to take this   doxycycline 100 MG tablet Commonly known as: VIBRA-TABS Take 1 tablet (100 mg total) by mouth 2 (two) times daily.   DULoxetine 60 MG capsule Commonly known as: CYMBALTA Take 60 mg  by mouth 2 (two) times daily.   famotidine 20 MG tablet Commonly known as: PEPCID Take 1 tablet (20 mg total) by mouth 2 (two) times daily.   FLUoxetine 40 MG capsule Commonly known as: PROZAC Take 40 mg by mouth every morning.   fluticasone 50 MCG/ACT nasal spray Commonly known as: FLONASE Place 2 sprays into both nostrils daily as needed for allergies or rhinitis.   gabapentin 300 MG capsule Commonly known as: NEURONTIN 1 cap by mouth in morning and midday, 2 caps by mouth at bedtime What changed:   how much to take  how to take this  when to take this  additional instructions   glipiZIDE 5 MG tablet Commonly known as: GLUCOTROL 1/2 tab by mouth twice daily with meal What changed:   how much to take  how to take this  when to take this  additional instructions   glucose blood test strip Commonly known as: AgaMatrix Presto Test Check blood sugars twice daily before meals   lamoTRIgine 100 MG tablet Commonly known as: LAMICTAL Take 100 mg by mouth 2 (two) times daily.   lisinopril 20 MG tablet Commonly known as: ZESTRIL Take 1 tablet (20 mg total) by mouth daily. Start taking on: Apr 22, 2021 What changed: These instructions start on Apr 22, 2021. If you are unsure what to do until then, ask your doctor or other care provider.   metFORMIN 500 MG 24 hr tablet Commonly known as: GLUCOPHAGE-XR Take 4 tablets (2,000 mg total) by mouth daily with breakfast. What changed:   how much to take  when to take this   metoCLOPramide 5 MG tablet Commonly known as: REGLAN Take 1 tablet (5 mg total) by mouth 3 (three) times daily before meals for 10 days.   metoprolol tartrate 25 MG tablet Commonly known as: LOPRESSOR Take 25 mg by mouth 2 (two) times daily.   ondansetron 8 MG tablet Commonly known as: ZOFRAN Take 8 mg by mouth every 8 (eight) hours as needed for nausea or vomiting.   polyethylene glycol 17 g packet Commonly known as: MIRALAX /  GLYCOLAX Take 17 g by mouth daily.   Trulicity 8.67 JQ/4.9EE Sopn Generic drug: Dulaglutide Inject 0.75 mg into the skin once a week. What changed: additional instructions   VITAMIN D3-VITAMIN C PO Take 1 tablet by mouth at bedtime.       Allergies  Allergen Reactions  . Adhesive [Tape] Rash    Rash at site of tape--okay to use paper tape    Consultations:  None   Procedures/Studies: DG Chest Port 1 View  Result Date: 04/09/2021 CLINICAL DATA:  Fever with dizziness and nausea. EXAM: PORTABLE CHEST 1 VIEW COMPARISON:  December 18, 2020 FINDINGS: The heart size and mediastinal contours are within normal limits. Both lungs are clear. No acute osseous abnormalities are identified. IMPRESSION: No active disease. Electronically Signed   By: Virgina Norfolk M.D.   On: 04/09/2021 22:41   DG Knee Complete 4 Views Right  Result Date: 04/09/2021 CLINICAL  DATA:  Open wound to the back of the right knee with fever. EXAM: RIGHT KNEE - COMPLETE 4+ VIEW COMPARISON:  None. FINDINGS: There is evidence of below the knee surgical amputation of the right leg. No evidence of fracture, dislocation, or joint effusion. No focal bone abnormality is seen. A tiny linear soft tissue defect is seen along the lateral aspect of the surgical stump site. IMPRESSION: Below the knee surgical amputation of the right leg, without an acute osseous abnormality. Electronically Signed   By: Virgina Norfolk M.D.   On: 04/09/2021 22:45   (Echo, Carotid, EGD, Colonoscopy, ERCP)    Subjective: No complaints feels great.  Discharge Exam: Vitals:   04/11/21 2107 04/12/21 0445  BP: (!) 161/95 (!) 158/93  Pulse: 82 89  Resp: 16 17  Temp: 98.2 F (36.8 C) 97.8 F (36.6 C)  SpO2: 96% 92%   Vitals:   04/11/21 0755 04/11/21 1516 04/11/21 2107 04/12/21 0445  BP: (!) 169/95 (!) 166/96 (!) 161/95 (!) 158/93  Pulse: 91 96 82 89  Resp: '16 16 16 17  ' Temp: 98.4 F (36.9 C) 98 F (36.7 C) 98.2 F (36.8 C) 97.8 F  (36.6 C)  TempSrc: Oral Oral Oral Oral  SpO2: 96% 96% 96% 92%  Weight:        General: Pt is alert, awake, not in acute distress Cardiovascular: RRR, S1/S2 +, no rubs, no gallops Respiratory: CTA bilaterally, no wheezing, no rhonchi Abdominal: Soft, NT, ND, bowel sounds + Extremities: no edema, no cyanosis    The results of significant diagnostics from this hospitalization (including imaging, microbiology, ancillary and laboratory) are listed below for reference.     Microbiology: Recent Results (from the past 240 hour(s))  Blood culture (routine x 2)     Status: None (Preliminary result)   Collection Time: 04/09/21 10:36 AM   Specimen: BLOOD  Result Value Ref Range Status   Specimen Description BLOOD LEFT ANTECUBITAL  Final   Special Requests Blood Culture adequate volume  Final   Culture   Final    NO GROWTH 1 DAY Performed at [facility], [place] [address] [place], [place] [address]    Report Status PENDING  Incomplete  Resp Panel by RT-PCR (Flu A&B, Covid) Nasopharyngeal Swab     Status: None   Collection Time: 04/09/21  8:57 PM   Specimen: Nasopharyngeal Swab; Nasopharyngeal(NP) swabs in vial transport medium  Result Value Ref Range Status   SARS Coronavirus 2 by RT PCR NEGATIVE NEGATIVE Final    Comment: (NOTE) SARS-CoV-2 target nucleic acids are NOT DETECTED.  The SARS-CoV-2 RNA is generally detectable in upper respiratory specimens during the acute phase of infection. The lowest concentration of SARS-CoV-2 viral copies this assay can detect is 138 copies/mL. A negative result does not preclude SARS-Cov-2 infection and should not be used as the sole basis for treatment or other patient management decisions. A negative result may occur with  improper specimen collection/handling, submission of specimen other than nasopharyngeal swab, presence of viral mutation(s) within the areas targeted by this assay, and inadequate number of viral copies(<138 copies/mL).  A negative result must be combined with clinical observations, patient history, and epidemiological information. The expected result is Negative.  Fact Sheet for Patients:  EntrepreneurPulse.com.au  Fact Sheet for Healthcare Providers:  IncredibleEmployment.be  This test is no t yet approved or cleared by the [place] FDA and  has been authorized for detection and/or diagnosis of SARS-CoV-2 by FDA under an Emergency Use  Authorization (EUA). This EUA will remain  in effect (meaning this test can be used) for the duration of the COVID-19 declaration under Section 564(b)(1) of the Act, 21 U.S.C.section 360bbb-3(b)(1), unless the authorization is terminated  or revoked sooner.       Influenza A by PCR NEGATIVE NEGATIVE Final   Influenza B by PCR NEGATIVE NEGATIVE Final    Comment: (NOTE) The Xpert Xpress SARS-CoV-2/FLU/RSV plus assay is intended as an aid in the diagnosis of influenza from Nasopharyngeal swab specimens and should not be used as a sole basis for treatment. Nasal washings and aspirates are unacceptable for Xpert Xpress SARS-CoV-2/FLU/RSV testing.  Fact Sheet for Patients: EntrepreneurPulse.com.au  Fact Sheet for Healthcare Providers: IncredibleEmployment.be  This test is not yet approved or cleared by the [place] FDA and has been authorized for detection and/or diagnosis of SARS-CoV-2 by FDA under an Emergency Use Authorization (EUA). This EUA will remain in effect (meaning this test can be used) for the duration of the COVID-19 declaration under Section 564(b)(1) of the Act, 21 U.S.C. section 360bbb-3(b)(1), unless the authorization is terminated or revoked.  Performed at [facility], [place] [address] [place], [place] [address]   Urine culture     Status: None   Collection Time: 04/09/21 10:23 PM   Specimen: In/Out Cath Urine  Result Value Ref Range Status   Specimen  Description IN/OUT CATH URINE  Final   Special Requests NONE  Final   Culture   Final    NO GROWTH Performed at [facility], [place] [address], [place], [place] [address]    Report Status 04/11/2021 FINAL  Final  Blood culture (routine x 2)     Status: None (Preliminary result)   Collection Time: 04/10/21 12:37 PM   Specimen: BLOOD  Result Value Ref Range Status   Specimen Description BLOOD RIGHT ANTECUBITAL  Final   Special Requests   Final    BOTTLES DRAWN AEROBIC AND ANAEROBIC Blood Culture adequate volume   Culture   Final    NO GROWTH < 24 HOURS Performed at [facility], [place] [address] [place], [place] [address]    Report Status PENDING  Incomplete     Labs: BNP (last 3 results) No results for input(s): BNP in the last 8760 hours. Basic Metabolic Panel: Recent Labs  Lab 04/09/21 2104 04/10/21 1121 04/11/21 0332 04/12/21 0347  NA 138 140 137 139  K 4.3 4.0 3.9 3.7  CL 110 110 108 106  CO2 23 21* 23 25  GLUCOSE 201* 201* 129* 131*  BUN 21* 27* 24* 20  CREATININE 2.19* 2.05* 1.92* 1.83*  CALCIUM 8.3* 8.4* 8.4* 8.4*   Liver Function Tests: No results for input(s): AST, ALT, ALKPHOS, BILITOT, PROT, ALBUMIN in the last 168 hours. No results for input(s): LIPASE, AMYLASE in the last 168 hours. No results for input(s): AMMONIA in the last 168 hours. CBC: Recent Labs  Lab 04/09/21 2104 04/10/21 1121  WBC 15.4* 13.8*  NEUTROABS 13.7*  --   HGB 9.4* 9.5*  HCT 31.2* 30.5*  MCV 92.9 91.3  PLT 198 200   Cardiac Enzymes: No results for input(s): CKTOTAL, CKMB, CKMBINDEX, TROPONINI in the last 168 hours. BNP: Invalid input(s): POCBNP CBG: Recent Labs  Lab 04/11/21 0640 04/11/21 1104 04/11/21 1604 04/11/21 2119 04/12/21 0637  GLUCAP 128* 227* 139* 220* 145*   D-Dimer No results for input(s): DDIMER in the last 72 hours. Hgb A1c Recent Labs    04/10/21 1228  HGBA1C 8.1*   Lipid Profile No results for input(s): CHOL, HDL, LDLCALC, TRIG,  CHOLHDL, LDLDIRECT in the last 72 hours. Thyroid function studies No results for input(s): TSH, T4TOTAL, T3FREE, THYROIDAB in the last 72 hours.  Invalid input(s): FREET3 Anemia work up No results for input(s): VITAMINB12, FOLATE, FERRITIN, TIBC, IRON, RETICCTPCT in the last 72 hours. Urinalysis    Component Value Date/Time   COLORURINE YELLOW 04/10/2021 0044   APPEARANCEUR CLEAR 04/10/2021 0044   LABSPEC 1.018 04/10/2021 0044   PHURINE 5.0 04/10/2021 0044   GLUCOSEU >=500 (A) 04/10/2021 0044   HGBUR SMALL (A) 04/10/2021 0044   BILIRUBINUR NEGATIVE 04/10/2021 0044   KETONESUR NEGATIVE 04/10/2021 0044   PROTEINUR >=300 (A) 04/10/2021 0044   NITRITE NEGATIVE 04/10/2021 0044   LEUKOCYTESUR NEGATIVE 04/10/2021 0044   Sepsis Labs Invalid input(s): PROCALCITONIN,  WBC,  LACTICIDVEN Microbiology Recent Results (from the past 240 hour(s))  Blood culture (routine x 2)     Status: None (Preliminary result)   Collection Time: 04/09/21 10:36 AM   Specimen: BLOOD  Result Value Ref Range Status   Specimen Description BLOOD LEFT ANTECUBITAL  Final   Special Requests Blood Culture adequate volume  Final   Culture   Final    NO GROWTH 1 DAY Performed at [facility], [place] [address], [place], [place] [address]    Report Status PENDING  Incomplete  Resp Panel by RT-PCR (Flu A&B, Covid) Nasopharyngeal Swab     Status: None   Collection Time: 04/09/21  8:57 PM   Specimen: Nasopharyngeal Swab; Nasopharyngeal(NP) swabs in vial transport medium  Result Value Ref Range Status   SARS Coronavirus 2 by RT PCR NEGATIVE NEGATIVE Final    Comment: (NOTE) SARS-CoV-2 target nucleic acids are NOT DETECTED.  The SARS-CoV-2 RNA is generally detectable in upper respiratory specimens during the acute phase of infection. The lowest concentration of SARS-CoV-2 viral copies this assay can detect is 138 copies/mL. A negative result does not preclude SARS-Cov-2 infection and should not be used as the  sole basis for treatment or other patient management decisions. A negative result may occur with  improper specimen collection/handling, submission of specimen other than nasopharyngeal swab, presence of viral mutation(s) within the areas targeted by this assay, and inadequate number of viral copies(<138 copies/mL). A negative result must be combined with clinical observations, patient history, and epidemiological information. The expected result is Negative.  Fact Sheet for Patients:  EntrepreneurPulse.com.au  Fact Sheet for Healthcare Providers:  IncredibleEmployment.be  This test is no t yet approved or cleared by the [place] FDA and  has been authorized for detection and/or diagnosis of SARS-CoV-2 by FDA under an Emergency Use Authorization (EUA). This EUA will remain  in effect (meaning this test can be used) for the duration of the COVID-19 declaration under Section 564(b)(1) of the Act, 21 U.S.C.section 360bbb-3(b)(1), unless the authorization is terminated  or revoked sooner.       Influenza A by PCR NEGATIVE NEGATIVE Final   Influenza B by PCR NEGATIVE NEGATIVE Final    Comment: (NOTE) The Xpert Xpress SARS-CoV-2/FLU/RSV plus assay is intended as an aid in the diagnosis of influenza from Nasopharyngeal swab specimens and should not be used as a sole basis for treatment. Nasal washings and aspirates are unacceptable for Xpert Xpress SARS-CoV-2/FLU/RSV testing.  Fact Sheet for Patients: EntrepreneurPulse.com.au  Fact Sheet for Healthcare Providers: IncredibleEmployment.be  This test is not yet approved or cleared by the [place] and has been authorized  for detection and/or diagnosis of SARS-CoV-2 by FDA under an Emergency Use Authorization (EUA). This EUA will remain in effect (meaning this test can be used) for the duration of the COVID-19 declaration under Section 564(b)(1) of the  Act, 21 U.S.C. section 360bbb-3(b)(1), unless the authorization is terminated or revoked.  Performed at [facility], [place] [address], [place], [place] [address]   Urine culture     Status: None   Collection Time: 04/09/21 10:23 PM   Specimen: In/Out Cath Urine  Result Value Ref Range Status   Specimen Description IN/OUT CATH URINE  Final   Special Requests NONE  Final   Culture   Final    NO GROWTH Performed at [facility], [place] [address] [place], [place] [address]    Report Status 04/11/2021 FINAL  Final  Blood culture (routine x 2)     Status: None (Preliminary result)   Collection Time: 04/10/21 12:37 PM   Specimen: BLOOD  Result Value Ref Range Status   Specimen Description BLOOD RIGHT ANTECUBITAL  Final   Special Requests   Final    BOTTLES DRAWN AEROBIC AND ANAEROBIC Blood Culture adequate volume   Culture   Final    NO GROWTH < 24 HOURS Performed at [facility], [place] [address], [place], [place] [address]    Report Status PENDING  Incomplete     Time coordinating discharge: Over 30 minutes  SIGNED:   Charlynne Cousins, MD  [facility] 04/12/2021, 10:08 AM Pager   If 7PM-7AM, please contact night-coverage [email or web address] Password [email or web address]

## 2021-04-12 NOTE — Progress Notes (Signed)
Discharge instructions reviewed with patient, IV removal well tolerated, patient transported to do via wheelchair to be transported by family car

## 2021-04-15 ENCOUNTER — Other Ambulatory Visit: Payer: Self-pay

## 2021-04-15 ENCOUNTER — Encounter (HOSPITAL_BASED_OUTPATIENT_CLINIC_OR_DEPARTMENT_OTHER): Payer: Medicare Other | Admitting: Internal Medicine

## 2021-04-15 DIAGNOSIS — S81801A Unspecified open wound, right lower leg, initial encounter: Secondary | ICD-10-CM | POA: Diagnosis not present

## 2021-04-15 DIAGNOSIS — F17200 Nicotine dependence, unspecified, uncomplicated: Secondary | ICD-10-CM | POA: Diagnosis not present

## 2021-04-15 DIAGNOSIS — L97529 Non-pressure chronic ulcer of other part of left foot with unspecified severity: Secondary | ICD-10-CM | POA: Diagnosis present

## 2021-04-15 DIAGNOSIS — E11621 Type 2 diabetes mellitus with foot ulcer: Secondary | ICD-10-CM | POA: Diagnosis not present

## 2021-04-15 DIAGNOSIS — L97522 Non-pressure chronic ulcer of other part of left foot with fat layer exposed: Secondary | ICD-10-CM

## 2021-04-15 DIAGNOSIS — Z89511 Acquired absence of right leg below knee: Secondary | ICD-10-CM | POA: Diagnosis not present

## 2021-04-15 DIAGNOSIS — X58XXXA Exposure to other specified factors, initial encounter: Secondary | ICD-10-CM | POA: Diagnosis not present

## 2021-04-15 DIAGNOSIS — Z833 Family history of diabetes mellitus: Secondary | ICD-10-CM | POA: Diagnosis not present

## 2021-04-15 DIAGNOSIS — E1142 Type 2 diabetes mellitus with diabetic polyneuropathy: Secondary | ICD-10-CM | POA: Diagnosis not present

## 2021-04-15 LAB — CULTURE, BLOOD (ROUTINE X 2)
Culture: NO GROWTH
Culture: NO GROWTH
Special Requests: ADEQUATE
Special Requests: ADEQUATE

## 2021-04-16 NOTE — Progress Notes (Signed)
JESSI, JESSOP ([id number]) Visit Report for 04/15/2021 Arrival Information Details Patient Name: Date of Service: [name] [name] 04/15/2021 1:30 PM Medical Record Number: [id number] Patient Account Number: [id number] Date of Birth/Sex: Treating RN: Jan 30, 1971 (50 y.o. [name] [name] Primary Care [name] [name]: [name] Other Clinician: Referring Siddiq Kaluzny: Treating Elsbeth Yearick/Extender: Delano Metz in Treatment: 68 Visit Information History Since Last Visit Added or deleted any medications: Yes Patient Arrived: Ambulatory Any new allergies or adverse reactions: No Arrival Time: 13:43 Had a fall or experienced change in No Accompanied By: self activities of daily living that may affect Transfer Assistance: None risk of falls: Patient Identification Verified: Yes Signs or symptoms of abuse/neglect since last visito No Secondary Verification Process Completed: Yes Hospitalized since last visit: Yes Patient Requires Transmission-Based Precautions: No Implantable device outside of the clinic excluding No Patient Has Alerts: No cellular tissue based products placed in the center since last visit: Has Dressing in Place as Prescribed: Yes Pain Present Now: No Electronic Signature(s) Signed: 04/15/2021 5:25:37 PM By: Deon Pilling Entered By: Deon Pilling on 04/15/2021 13:46:40 -------------------------------------------------------------------------------- Encounter Discharge Information Details Patient Name: Date of Service: [name] Shire ND, [name] 04/15/2021 1:30 PM Medical Record Number: [id number] Patient Account Number: [id number] Date of Birth/Sex: Treating RN: 02/07/1971 (50 y.o. [name] [name] Primary Care Makesha Belitz: [name] Other Clinician: Referring Phoebe Marter: Treating Decarlo Rivet/Extender: Delano Metz in Treatment: 13 Encounter Discharge Information Items Post Procedure Vitals Discharge Condition:  Stable Temperature (F): 98.2 Ambulatory Status: Ambulatory Pulse (bpm): 94 Discharge Destination: Home Respiratory Rate (breaths/min): 20 Transportation: Private Auto Blood Pressure (mmHg): 159/93 Schedule Follow-up Appointment: Yes Clinical Summary of Care: Provided on 04/15/2021 Form Type Recipient Paper Patient Patient Electronic Signature(s) Signed: 04/15/2021 3:23:15 PM By: Lorrin Jackson Entered By: Lorrin Jackson on 04/15/2021 15:23:15 -------------------------------------------------------------------------------- Lower Extremity Assessment Details Patient Name: Date of Service: [name] ND, [name] 04/15/2021 1:30 PM Medical Record Number: [id number] Patient Account Number: [id number] Date of Birth/Sex: Treating RN: Mar 28, 1971 (50 y.o. [name] [name] Primary Care Rebel Willcutt: [name] Other Clinician: Referring Robbie Nangle: Treating Danell Vazquez/Extender: Delano Metz in Treatment: 34 Edema Assessment Assessed: [name] [name]: Yes] [name]: No] Edema: [Left: Ye] [Right: s] Calf Left: Right: Point of Measurement: 50 cm From Medial Instep 40 cm Ankle Left: Right: Point of Measurement: 11 cm From Medial Instep 29 cm Vascular Assessment Pulses: Dorsalis Pedis Palpable: [Left:Yes] Electronic Signature(s) Signed: 04/15/2021 5:25:37 PM By: Deon Pilling Entered By: Deon Pilling on 04/15/2021 13:47:14 -------------------------------------------------------------------------------- Multi Wound Chart Details Patient Name: Date of Service: [name] Shire ND, [name] 04/15/2021 1:30 PM Medical Record Number: [id number] Patient Account Number: [id number] Date of Birth/Sex: Treating RN: December 20, 1970 (50 y.o. [name] [name] Primary Care Zenaida Tesar: [name] Other Clinician: Referring Shavone Nevers: Treating Weslie Pretlow/Extender: Delano Metz in Treatment: 34 Vital Signs Height(in): 42 Capillary Blood Glucose(mg/dl):  90 Weight(lbs): 232 Pulse(bpm): 94 Body Mass Index(BMI): 28 Blood Pressure(mmHg): 159/93 Temperature(F): 98.2 Respiratory Rate(breaths/min): 20 Photos: [2R:No Photos Left Metatarsal head first] [3:No Photos Right, Posterior Knee] [N/A:N/A N/A] Wound Location: [2R:Blister] [3:Pressure Injury] [N/A:N/A] Wounding Event: [2R:Diabetic Wound/Ulcer of the Lower] [3:Pressure Ulcer] [N/A:N/A] Primary Etiology: [2R:Extremity Anemia, Hypertension, Peripheral] [3:Anemia, Hypertension, Peripheral] [N/A:N/A] Comorbid History: [2R:Venous Disease, Type II Diabetes 06/23/2020] [3:Venous Disease, Type II Diabetes 03/31/2021] [N/A:N/A] Date Acquired: [2R:34] [3:1] [N/A:N/A] Weeks of Treatment: [2R:Open] [3:Open] [N/A:N/A] Wound Status: [2R:Yes] [3:No] [N/A:N/A] Wound Recurrence: [2R:No] [3:Yes] [N/A:N/A] Clustered Wound: [2R:N/A] [3:2] [N/A:N/A] Clustered Quantity: [2R:0.6x0.3x0.5] [3:1.5x2x0.1] [N/A:N/A] Measurements L x W  x D (cm) [2R:0.141] [3:2.356] [N/A:N/A] A (cm) : rea [2R:0.071] [3:0.236] [N/A:N/A] Volume (cm) : [2R:-354.80%] [3:-44.90%] [N/A:N/A] % Reduction in A rea: [2R:-2266.70%] [3:-44.80%] [N/A:N/A] % Reduction in Volume: [2R:12] Starting Position 1 (o'clock): [2R:12] Ending Position 1 (o'clock): [2R:1.3] Maximum Distance 1 (cm): [2R:Yes] [3:No] [N/A:N/A] Undermining: [2R:Grade 2] [3:Unstageable/Unclassified] [N/A:N/A] Classification: [2R:Small] [3:Medium] [N/A:N/A] Exudate A mount: [2R:Serosanguineous] [3:Serous] [N/A:N/A] Exudate Type: [2R:red, brown] [3:amber] [N/A:N/A] Exudate Color: [2R:Well defined, not attached] [3:Flat and Intact] [N/A:N/A] Wound Margin: [2R:Large (67-100%)] [3:Small (1-33%)] [N/A:N/A] Granulation A mount: [2R:Pink, Pale] [3:Red] [N/A:N/A] Granulation Quality: [2R:None Present (0%)] [3:Large (67-100%)] [N/A:N/A] Necrotic A mount: [2R:Fat Layer (Subcutaneous Tissue): Yes Fat Layer (Subcutaneous Tissue): Yes N/A] Exposed Structures: [2R:Fascia: No Tendon: No  Muscle: No Joint: No Bone: No Small (1-33%)] [3:Fascia: No Tendon: No Muscle: No Joint: No Bone: No None] [N/A:N/A] Epithelialization: [2R:Debridement - Excisional] [3:N/A] [N/A:N/A] Debridement: Pre-procedure Verification/Time Out 14:48 [3:N/A] [N/A:N/A] Taken: [2R:Lidocaine] [3:N/A] [N/A:N/A] Pain Control: [2R:Subcutaneous, Slough] [3:N/A] [N/A:N/A] Tissue Debrided: [2R:Skin/Subcutaneous Tissue] [3:N/A] [N/A:N/A] Level: [2R:0.18] [3:N/A] [N/A:N/A] Debridement A (sq cm): [2R:rea Curette] [3:N/A] [N/A:N/A] Instrument: [2R:Minimum] [3:N/A] [N/A:N/A] Bleeding: [2R:Pressure] [3:N/A] [N/A:N/A] Hemostasis A chieved: [2R:0] [3:N/A] [N/A:N/A] Procedural Pain: [2R:0] [3:N/A] [N/A:N/A] Post Procedural Pain: [2R:Procedure was tolerated well] [3:N/A] [N/A:N/A] Debridement Treatment Response: [2R:0.6x0.3x0.5] [3:N/A] [N/A:N/A] Post Debridement Measurements L x W x D (cm) [2R:0.071] [3:N/A] [N/A:N/A] Post Debridement Volume: (cm) [2R:maceration to periwound.] [3:N/A] [N/A:N/A] Assessment Notes: [2R:Debridement] [3:N/A] [N/A:N/A] Treatment Notes Wound #2R (Metatarsal head first) Wound Laterality: Left Cleanser Wound Cleanser Discharge Instruction: Cleanse the wound with wound cleanser prior to applying a clean dressing using gauze sponges, not tissue or cotton balls. Soap and Water Discharge Instruction: May shower and wash wound with dial antibacterial soap and water prior to dressing change. Peri-Wound Care Sween Lotion (Moisturizing lotion) Discharge Instruction: Apply moisturizing lotion as directed Topical Primary Dressing KerraCel Ag Gelling Fiber Dressing, 4x5 in (silver alginate) Discharge Instruction: Apply silver alginate to wound bed as instructed Secondary Dressing Woven Gauze Sponges 2x2 in Discharge Instruction: Apply over primary dressing as directed. Optifoam Non-Adhesive Dressing, 4x4 in Discharge Instruction: Apply over primary dressing cut to make foam donut to help  offload Secured With [place] [facility] T ape, 2x2 (in/yd) Discharge Instruction: Secure dressing with tape as directed. Conforming Stretch Gauze Bandage, Sterile 2x75 (in/in) Discharge Instruction: Secure with stretch gauze as directed. Compression Wrap Compression Stockings Add-Ons Wound #3 (Knee) Wound Laterality: Right, Posterior Cleanser Wound Cleanser Discharge Instruction: Cleanse the wound with wound cleanser prior to applying a clean dressing using gauze sponges, not tissue or cotton balls. Soap and Water Discharge Instruction: May shower and wash wound with dial antibacterial soap and water prior to dressing change. Peri-Wound Care Topical Triple Antibiotic Ointment, 1 (oz) Tube Primary Dressing Secondary Dressing Woven Gauze Sponge, Non-Sterile 4x4 in Discharge Instruction: Apply over primary dressing as directed. ABD Pad, 5x9 Discharge Instruction: Apply over primary dressing as directed. Secured With [employer], 4.5x3.1 (in/yd) Discharge Instruction: Secure with Kerlix as directed. Compression Wrap Compression Stockings Add-Ons Electronic Signature(s) Signed: 04/15/2021 4:37:58 PM By: Kalman Shan DO Signed: 04/16/2021 4:26:30 PM By: Rhae Hammock RN Entered By: Kalman Shan on 04/15/2021 16:24:40 -------------------------------------------------------------------------------- Multi-Disciplinary Care Plan Details Patient Name: Date of Service: [name] Shire ND, [name] 04/15/2021 1:30 PM Medical Record Number: [id number] Patient Account Number: [id number] Date of Birth/Sex: Treating RN: Aug 24, 1971 (49 y.o. [name] [name] Primary Care Hadriel Northup: [name] Other Clinician: Referring Italia Wolfert: Treating Niki Payment/Extender: Delano Metz in Treatment: 34 Multidisciplinary  Care Plan reviewed with physician Active Inactive Wound/Skin Impairment Nursing Diagnoses: Impaired tissue  integrity Goals: Patient/caregiver will verbalize understanding of skin care regimen Date Initiated: 11/19/2020 Target Resolution Date: 05/16/2021 Goal Status: Active Ulcer/skin breakdown will have a volume reduction of 30% by week 4 Date Initiated: 08/16/2020 Date Inactivated: 03/03/2021 Target Resolution Date: 03/10/2021 Goal Status: Met Interventions: Provide education on ulcer and skin care Notes: Electronic Signature(s) Signed: 04/16/2021 4:26:30 PM By: Rhae Hammock RN Entered By: Rhae Hammock on 04/15/2021 14:53:21 -------------------------------------------------------------------------------- Pain Assessment Details Patient Name: Date of Service: [name] Shire ND, [name] 04/15/2021 1:30 PM Medical Record Number: [id number] Patient Account Number: [id number] Date of Birth/Sex: Treating RN: 06-26-1971 (50 y.o. [name] [name] Primary Care Deretha Ertle: [name] Other Clinician: Referring Kaleigh Spiegelman: Treating Jianni Shelden/Extender: Delano Metz in Treatment: 83 Active Problems Location of Pain Severity and Description of Pain Patient Has Paino No Site Locations Rate the pain. Current Pain Level: 0 Pain Management and Medication Current Pain Management: Medication: No Cold Application: No Rest: No Massage: No Activity: No T.E.N.S.: No Heat Application: No Leg drop or elevation: No Is the Current Pain Management Adequate: Adequate How does your wound impact your activities of daily livingo Sleep: No Bathing: No Appetite: No Relationship With Others: No Bladder Continence: No Emotions: No Bowel Continence: No Work: No Toileting: No Drive: No Dressing: No Hobbies: No Electronic Signature(s) Signed: 04/15/2021 5:25:37 PM By: Deon Pilling Entered By: Deon Pilling on 04/15/2021 13:47:04 -------------------------------------------------------------------------------- Patient/Caregiver Education Details Patient Name: Date of  Service: [name] Shire ND, Payton Doughty 5/24/2022andnbsp1:30 PM Medical Record Number: [id number] Patient Account Number: [id number] Date of Birth/Gender: Treating RN: June 18, 1971 (50 y.o. [name] Primary Care Physician: [name] Other Clinician: Referring Physician: Treating Physician/Extender: Delano Metz in Treatment: 22 Education Assessment Education Provided To: Patient Education Topics Provided Wound/Skin Impairment: Methods: Explain/Verbal Responses: State content correctly [employer]) Signed: 04/16/2021 4:26:30 PM By: Rhae Hammock RN Entered By: Rhae Hammock on 04/15/2021 14:53:35 -------------------------------------------------------------------------------- Wound Assessment Details Patient Name: Date of Service: [name] Shire ND, [name] 04/15/2021 1:30 PM Medical Record Number: [id number] Patient Account Number: [id number] Date of Birth/Sex: Treating RN: [birth year] (50 y.o. [name], [name] Primary Care Jionni Helming: [name] Other Clinician: Referring Merranda Bolls: Treating Cheryl Chay/Extender: Delano Metz in Treatment: 34 Wound Status Wound Number: 2R Primary Diabetic Wound/Ulcer of the Lower Extremity Etiology: Wound Location: Left Metatarsal head first Wound Status: Open Wounding Event: Blister Comorbid Anemia, Hypertension, Peripheral Venous Disease, Type II Date Acquired: 06/23/2020 History: Diabetes Weeks Of Treatment: 34 Clustered Wound: No Photos Wound Measurements Length: (cm) 0.6 Width: (cm) 0.3 Depth: (cm) 0.5 Area: (cm) 0.141 Volume: (cm) 0.071 % Reduction in Area: -354.8% % Reduction in Volume: -2266.7% Epithelialization: Small (1-33%) Tunneling: No Undermining: Yes Starting Position (o'clock): 12 Ending Position (o'clock): 12 Maximum Distance: (cm) 1.3 Wound Description Classification: Grade 2 Wound Margin: Well defined, not attached Exudate Amount:  Small Exudate Type: Serosanguineous Exudate Color: red, brown Foul Odor After Cleansing: No Slough/Fibrino No Wound Bed Granulation Amount: Large (67-100%) Exposed Structure Granulation Quality: Pink, Pale Fascia Exposed: No Necrotic Amount: None Present (0%) Fat Layer (Subcutaneous Tissue) Exposed: Yes Tendon Exposed: No Muscle Exposed: No Joint Exposed: No Bone Exposed: No Assessment Notes maceration to periwound. Treatment Notes Wound #2R (Metatarsal head first) Wound Laterality: Left Cleanser Wound Cleanser Discharge Instruction: Cleanse the wound with wound cleanser prior to applying a clean dressing using gauze sponges, not tissue or cotton balls. Soap and Water Discharge Instruction:  May shower and wash wound with dial antibacterial soap and water prior to dressing change. Peri-Wound Care Sween Lotion (Moisturizing lotion) Discharge Instruction: Apply moisturizing lotion as directed Topical Primary Dressing KerraCel Ag Gelling Fiber Dressing, 4x5 in (silver alginate) Discharge Instruction: Apply silver alginate to wound bed as instructed Secondary Dressing Woven Gauze Sponges 2x2 in Discharge Instruction: Apply over primary dressing as directed. Optifoam Non-Adhesive Dressing, 4x4 in Discharge Instruction: Apply over primary dressing cut to make foam donut to help offload Secured With [place] [facility] T ape, 2x2 (in/yd) Discharge Instruction: Secure dressing with tape as directed. Conforming Stretch Gauze Bandage, Sterile 2x75 (in/in) Discharge Instruction: Secure with stretch gauze as directed. Compression Wrap Compression Stockings Add-Ons Electronic Signature(s) Signed: 04/15/2021 4:53:55 PM By: Sandre Kitty Signed: 04/15/2021 5:25:37 PM By: Deon Pilling Entered By: Sandre Kitty on 04/15/2021 16:51:01 -------------------------------------------------------------------------------- Wound Assessment Details Patient Name: Date of  Service: [name] Shire ND, [place] 04/15/2021 1:30 PM Medical Record Number: [id number] Patient Account Number: [id number] Date of Birth/Sex: Treating RN: [birth year] (50 y.o. [name] [name] Primary Care Destiney Sanabia: [name] Other Clinician: Referring Trysten Bernard: Treating Janean Eischen/Extender: Delano Metz in Treatment: 34 Wound Status Wound Number: 3 Primary Pressure Ulcer Etiology: Wound Location: Right, Posterior Knee Wound Status: Open Wounding Event: Pressure Injury Comorbid Anemia, Hypertension, Peripheral Venous Disease, Type II Date Acquired: 03/31/2021 History: Diabetes Weeks Of Treatment: 1 Clustered Wound: Yes Photos Wound Measurements Length: (cm) 1.5 Width: (cm) 2 Depth: (cm) 0.1 Clustered Quantity: 2 Area: (cm) 2.356 Volume: (cm) 0.236 % Reduction in Area: -44.9% % Reduction in Volume: -44.8% Epithelialization: None Tunneling: No Undermining: No Wound Description Classification: Unstageable/Unclassified Wound Margin: Flat and Intact Exudate Amount: Medium Exudate Type: Serous Exudate Color: amber Foul Odor After Cleansing: No Slough/Fibrino Yes Wound Bed Granulation Amount: Small (1-33%) Exposed Structure Granulation Quality: Red Fascia Exposed: No Necrotic Amount: Large (67-100%) Fat Layer (Subcutaneous Tissue) Exposed: Yes Necrotic Quality: Adherent Slough Tendon Exposed: No Muscle Exposed: No Joint Exposed: No Bone Exposed: No Treatment Notes Wound #3 (Knee) Wound Laterality: Right, Posterior Cleanser Wound Cleanser Discharge Instruction: Cleanse the wound with wound cleanser prior to applying a clean dressing using gauze sponges, not tissue or cotton balls. Soap and Water Discharge Instruction: May shower and wash wound with dial antibacterial soap and water prior to dressing change. Peri-Wound Care Topical Triple Antibiotic Ointment, 1 (oz) Tube Primary Dressing Secondary Dressing Woven Gauze Sponge,  Non-Sterile 4x4 in Discharge Instruction: Apply over primary dressing as directed. ABD Pad, 5x9 Discharge Instruction: Apply over primary dressing as directed. Secured With [employer], 4.5x3.1 (in/yd) Discharge Instruction: Secure with Kerlix as directed. Compression Wrap Compression Stockings Add-Ons Electronic Signature(s) Signed: 04/15/2021 4:53:55 PM By: Sandre Kitty Signed: 04/15/2021 5:25:37 PM By: Deon Pilling Entered By: Sandre Kitty on 04/15/2021 16:50:37 -------------------------------------------------------------------------------- Vitals Details Patient Name: Date of Service: [name] ND, [name] 04/15/2021 1:30 PM Medical Record Number: [id number] Patient Account Number: [id number] Date of Birth/Sex: Treating RN: 1971-07-02 (50 y.o. [name] [name] Primary Care Catalea Labrecque: [name] Other Clinician: Referring Milly Goggins: Treating Waynesha Rammel/Extender: Delano Metz in Treatment: 34 Vital Signs Time Taken: 13:45 Temperature (F): 98.2 Height (in): 77 Pulse (bpm): 94 Weight (lbs): 232 Respiratory Rate (breaths/min): 20 Body Mass Index (BMI): 27.5 Blood Pressure (mmHg): 159/93 Capillary Blood Glucose (mg/dl): 90 Reference Range: 80 - 120 mg / dl Electronic Signature(s) Signed: 04/15/2021 5:25:37 PM By: Deon Pilling Entered By: Deon Pilling on 04/15/2021 13:46:55

## 2021-04-16 NOTE — Progress Notes (Signed)
SYON, TEWS ([id number]) Visit Report for 04/15/2021 Chief Complaint Document Details Patient Name: Date of Service: [name] [name] [name] 04/15/2021 1:30 PM Medical Record Number: [id number] Patient Account Number: [id number] Date of Birth/Sex: Treating RN: 11-25-1970 (50 y.o. [name] [name] [name], [name] [name] [name] Primary Care Provider: [name] Other Clinician: Referring Provider: Treating Provider/Extender: Delano Metz in Treatment: 74 Information Obtained from: Patient Chief Complaint patient is here for a review of the wound on his plantar left first metatarsal head and back of his right leg Electronic Signature(s) Signed: 04/15/2021 4:37:58 PM By: Kalman Shan DO Entered By: Kalman Shan on 04/15/2021 16:24:53 -------------------------------------------------------------------------------- Debridement Details Patient Name: Date of Service: [name] [name] [name] [name] [name] [name], [name] [name] [name] L. 04/15/2021 1:30 PM Medical Record Number: [id number] Patient Account Number: [id number] Date of Birth/Sex: Treating RN: 07/25/1971 (50 y.o. [name] [name] [name], [name] [name] [name] Primary Care Provider: [name] Other Clinician: Referring Provider: Treating Provider/Extender: Delano Metz in Treatment: 34 Debridement Performed for Assessment: Wound #2R Left Metatarsal head first Performed By: Physician Kalman Shan, DO Debridement Type: Debridement Severity of Tissue Pre Debridement: Fat layer exposed Level of Consciousness (Pre-procedure): Awake and Alert Pre-procedure Verification/Time Out Yes - 14:48 Taken: Start Time: 14:48 Pain Control: Lidocaine T Area Debrided (L x W): otal 0.6 (cm) x 0.3 (cm) = 0.18 (cm) Tissue and other material debrided: Viable, Non-Viable, Slough, Subcutaneous, Skin: Dermis , Skin: Epidermis, Slough Level: Skin/Subcutaneous Tissue Debridement Description: Excisional Instrument: Curette Bleeding: Minimum Hemostasis Achieved:  Pressure End Time: 14:49 Procedural Pain: 0 Post Procedural Pain: 0 Response to Treatment: Procedure was tolerated well Level of Consciousness (Post- Awake and Alert procedure): Post Debridement Measurements of Total Wound Length: (cm) 0.6 Width: (cm) 0.3 Depth: (cm) 0.5 Volume: (cm) 0.071 Character of Wound/Ulcer Post Debridement: Improved Severity of Tissue Post Debridement: Fat layer exposed Post Procedure Diagnosis Same as Pre-procedure Electronic Signature(s) Signed: 04/15/2021 4:37:58 PM By: Kalman Shan DO Signed: 04/16/2021 4:26:30 PM By: Rhae Hammock RN Entered By: Rhae Hammock on 04/15/2021 14:49:18 -------------------------------------------------------------------------------- HPI Details Patient Name: Date of Service: [name] [name] [name], [name] [name] [name] L. 04/15/2021 1:30 PM Medical Record Number: [id number] Patient Account Number: [id number] Date of Birth/Sex: Treating RN: 1971/07/30 (50 y.o. [name] [name] [name] Primary Care Provider: [name] Other Clinician: Referring Provider: Treating Provider/Extender: Delano Metz in Treatment: 62 History of Present Illness HPI Description: 04/21/18 ADMISSION This is a 50 year old man who is a type II diabetic. In spite of fact his hemoglobin A1c is actually quite good 5.73 months ago he is at a really difficult time over the last 6-7 months. He developed a rapidly progressive infection in the right foot in November 2018 associated with osteomyelitis and necrotizing fasciitis. 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 The stump required and a revision on 11/24/17. The stump revision was advertised as being secondary to falls. I'm not sure the progressive history here however this area is actually closed over. The patient tells me over the same timeframe he has had wounds on the plantar aspect of his left foot. In the ED saw Dr. Sharol Given in April of this year. Noted that have Wagner grade 1 diabetic ulcers on  the fifth didn't first metatarsal heads. It is really not clear that this patient is been dressing this with anything. He came in with the clinic without any specific dressing on the wound areas using his own tennis shoe. He tells me that he only ambulates of course to do a pivot transfer. He does not have his prosthesis  for the right leg as of yet and he blames Medicaid for this. He does however use the foot to push himself along in his wheelchair at home. The patient has not had formal arterial studies. ABI in our clinic on the left was 1.13. Patient's past medical history includes type 2 diabetes, fracture of the right fibula. I see that he was treated for abscesses on his right buttock and chest in 2016. I did not look at the microbiology of this. 04/28/18; patient comes back in the clinic today with the wound pretty much the same as when he came in here last week. Small opening lots of undermining relatively. He tells [place] that nothing is really been on this for 3 days in spite of the fact that we gave him enough to dress this easily especially such a small wound. He says he lives with his mother, she is not capable of assisting with this he is changing the dressings himself. 05/05/18; much better-looking wound today. Smaller. There is some undermining medially however that's only perhaps 2 mm. No surrounding erythema. We've been using silver collagen 05/12/18; small wound on the first metatarsal head. No undermining no surrounding erythema. We've been using silver collagen he has home health coming out to change 05/20/18 the wound on the first metatarsal head looks better. Covered in surface debris/callus nonviable tissue. Required debridement but post debridement this looks quite good. We've been using silver collagen. He has home health 05/27/18; first metatarsal head wound continues to improve. Just about completely closed. Still a lot of surface debridement callus. We've been using  silver collagen 06/06/18; the first metatarsal wound is completely healed over. There is still a lot of callus. I gently removed some of this just to make sure that there was no open area and there is not. The patient mentions to me that his left leg has felt like "lead" for about a week READMISSION 08/16/2020 This is a 50 year old man with type 2 diabetes. He returns to clinic today with a 1 month history of a blister and callus over the first metatarsal head. This is in exactly the same area as when he was here in 2019. 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 He has standard running shoes on the left foot to match the area in his prosthesis. He has only been covering this area with a Band-Aid has not really been specifically dressing this. ABI in our clinic on the left was 1.16. This is essentially stable from the value in 2019 10/1 the patient has a linear wound over the left first metatarsal head. Again not a lot of callus thick skin around this that I removed with a #3 curette. This is not go deep to bone or muscle it does not look to be infected. 10/8 first metatarsal head. The wound looks like it is closing however this is going to be a difficult area to offload in the future. He has a size 15 shoe and he says his shoes are years old. The inserts in these current shoes are totally useless and I have advised him to replace these. He may need a new pair of shoes and I have he got original prosthesis at [facility] [place] I have asked him to go back there. 11/9; the patient has not been here in more than a month. Not sure he was healed when he was here the last time. I told him he  would have to get new shoes and certainly offload the area over the left first metatarsal head. He has done nothing of the kind. He arrives back in clinic with the same old new balance sneakers. A very large separating callus over the first metatarsal head on the  left 11/16; he has purchased new shoes and has at least 2 insoles like I asked. The wound is measuring much smaller. He is using silver alginate he changes the dressing himself 11/30; wound is measurably smaller in length of about a half a centimeter. This is generally very little depth. We have been using silver alginate. He changes the dressing himself every second day. 12/14; wound is about the same size. Significant undermining medially relative to the size of the wound. We have been using silver alginate. There is not a way to offload this area as he walks with a prosthesis on the right leg 12/28; wound is about the same size. Again he has undermining medially. I am using polymen on this 12/02/2020; the wound looks smaller. Still a senescent edge. We have been using polymen 1/24; the wound is come down a few millimeters. Still a senescent edge. I have been using polymen 2/8; the wound is come down slightly. Still with slight undermining from 12-6 o'clock I have been using polymen partially to get offloading on this area which is opposed by his prosthesis on the other leg. 2/22 quite open improvement he still has a comma shaped wound on the left first metatarsal head. Some depth. Using polymen 3/14; he still has the same problem of a comma shaped area over the left first metatarsal head. This seems to have had more depth today at 0.6 cm. We have been using polymen. The patient changes this himself every second day. I explored the idea of a total contact cast on the left leg however this is his driving foot. He was not enamored with the idea of not being able to drive and states that he does not have anybody else to get him around 3/28; again he comes in with a smaller looking wound however there is depth and undermining requiring debridement. We have been using Hydrofera Blue, changed to silver collagen today. The patient is doing the dressing himself 4/11; patient presents today for follow-up  of his left diabetic foot wound. He denies any issues in the past 2 weeks. He is currently using silver collagen every other day with dressing changes. He does not use diabetic shoes or special inserts to his sneakers. He does not use felt pads for offloading. 4/19; patient presents for his 1 week follow-up. He denies any issues. He reports using Hydrofera Blue and has not been using silver collagen for dressing changes. He reports minimal drainage to the wound. He overall feels well. 5/3; patient presents for 2-week follow-up. He has been using silver collagen without issues. He has no complaints today and states he overall feels well. 5/17; patient presents for 2-week follow-up. He states that the sleeve is rubbing up against the back of his right leg and causing a wound. He states this happened a week ago and has not been dressing the wound. The plantar wound is stable and he has been using collagen every other day. 5/24; Patient was had a recent hospitalization for cellulitis of his right popliteal fossa. He was admitted and started on IV antibiotics. He does not recall the plantar wound dressed while inpatient. He feels a lot better today. He was discharged on 5/21 on  oral antibiotics and has not picked this up until today. He reports using collagen to the plantar wound since discharge. He is keeping the right posterior knee wound covered. He is getting a new sleeve for his prostatic on 5/26. He currently denies signs of infection. Electronic Signature(s) Signed: 04/15/2021 4:37:58 PM By: Kalman Shan DO Entered By: Kalman Shan on 04/15/2021 16:26:02 -------------------------------------------------------------------------------- Physical Exam Details Patient Name: Date of Service: [name] [name] [name], [place] 04/15/2021 1:30 PM Medical Record Number: [id number] Patient Account Number: [id number] Date of Birth/Sex: Treating RN: March 18, 1971 (50 y.o. [name] [name] [name] Primary Care Provider:  [name] Other Clinician: Referring Provider: Treating Provider/Extender: Delano Metz in Treatment: 5 Constitutional respirations regular, non-labored and within target range for patient.. Cardiovascular 2+ dorsalis pedis/posterior tibialis pulses. Psychiatric pleasant and cooperative. Notes Left first metatarsal head: Small opening with circumferential callus And nonviable tissue. Once callus was removed subcutaneous tissue was debrided and good granulation tissue is present. This is larger than previous clinic visit. No acute signs of infection. Posterior right knee: 2 wounds limited to skin breakdown. Electronic Signature(s) Signed: 04/15/2021 4:37:58 PM By: Kalman Shan DO Entered By: Kalman Shan on 04/15/2021 16:27:00 -------------------------------------------------------------------------------- Physician Orders Details Patient Name: Date of Service: [name] [name] [name], [name] [name] [name] L. 04/15/2021 1:30 PM Medical Record Number: [id number] Patient Account Number: [id number] Date of Birth/Sex: Treating RN: [birth year] (50 y.o. 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 - Dr. Heber [place]!! Return A Bathing/ Shower/ Hygiene May shower and wash wound with soap and water. - WITH DRESSING CHANGES. Edema Control - Lymphedema / SCD / Other Elevate legs to the level of the heart or above for 30  minutes daily and/or when sitting, a frequency of: Avoid standing for long periods of time. Off-Loading Other: - use felt for offloading Additional Orders / Instructions Other: - Pt. can get a new prostethic sleeve for RIGHT STUMP Non Wound Condition Other Non Wound Condition Orders/Instructions: - Apply anti-fungal cream to rash on right stump Wound Treatment Wound #2R - Metatarsal head first Wound Laterality: Left Cleanser: Soap and Water Every Other Day/7 Days Discharge Instructions: May shower and wash wound with dial antibacterial soap and water prior to dressing change. Cleanser: Wound Cleanser (Generic) Every Other Day/7 Days Discharge Instructions: Cleanse the wound with wound cleanser prior to applying a clean dressing using gauze sponges, not tissue or cotton balls. Peri-Wound Care: Sween Lotion (Moisturizing lotion) Every Other Day/7 Days Discharge Instructions: Apply moisturizing lotion as directed Prim Dressing: KerraCel Ag Gelling Fiber Dressing, 4x5 in (silver alginate) Every Other Day/7 Days ary Discharge Instructions: Apply silver alginate to wound bed as instructed Secondary Dressing: Woven Gauze Sponges 2x2 in (Generic) Every Other Day/7 Days Discharge Instructions: Apply over primary dressing as directed. Secondary Dressing: Optifoam Non-Adhesive Dressing, 4x4 in (Generic) Every Other Day/7 Days Discharge Instructions: Apply over primary dressing cut to make foam donut to help offload Secured With: Conforming Stretch Gauze Bandage, Sterile 2x75 (in/in) (Generic) Every Other Day/7 Days Discharge Instructions: Secure with stretch gauze as directed. Secured With: [employer]  Medipore H Soft Cloth Surgical Tape, 2x2 (in/yd) (Generic) Every Other Day/7 Days Discharge Instructions: Secure dressing with tape as directed. Wound #3 - Knee Wound Laterality: Right, Posterior Cleanser: Soap and Water Every Other Day/7 Days Discharge Instructions: May shower and wash wound with dial  antibacterial soap and water prior to dressing change. Cleanser: Wound Cleanser (Generic) Every Other Day/7 Days Discharge Instructions: Cleanse the wound with wound cleanser prior to applying a clean dressing using gauze sponges, not tissue or cotton balls. Topical: Triple Antibiotic Ointment, 1 (oz) Tube Every Other Day/7 Days Secondary Dressing: Woven Gauze Sponge, Non-Sterile 4x4 in (Generic) Every Other Day/7 Days Discharge Instructions: Apply over primary dressing as directed. Secondary Dressing: ABD Pad, 5x9 (Generic) Every Other Day/7 Days Discharge Instructions: Apply over primary dressing as directed. Secured With: [employer], 4.5x3.1 (in/yd) (Generic) Every Other Day/7 Days Discharge Instructions: Secure with Kerlix as directed. Electronic Signature(s) Signed: 04/15/2021 4:37:58 PM By: Kalman Shan DO Entered By: Kalman Shan on 04/15/2021 16:27:21 Prescription 04/15/2021 -------------------------------------------------------------------------------- [name], [name] [name] L. Kalman Shan DO Patient Name: Provider: 09-09-1971 [id number] Date of Birth: NPI#: [name] [id number] Sex: DEA #: [phone] Phone #: License #: [place] Patient Address: [place], [place] [address] [place], [place] [address] [phone] Allergies adhesive tape Provider's Orders Other: - Pt. can get a new prostethic sleeve for RIGHT STUMP Hand Signature: Date(s): Electronic Signature(s) Signed: 04/15/2021 4:37:58 PM By: Kalman Shan DO Entered By: Kalman Shan on 04/15/2021 16:27:21 -------------------------------------------------------------------------------- Problem List Details Patient Name: Date of Service: [name] [name] [name] [name] [name] [name], [name] [name] [name] L. 04/15/2021 1:30 PM Medical Record Number: [id number] Patient Account Number: [id number] Date of Birth/Sex: Treating RN: 07/02/1971 (50 y.o. [name] [name] [name], [name] [name] [name] Primary Care Provider: [name] Other Clinician: Referring Provider: Treating Provider/Extender: Delano Metz in Treatment: 62 Active Problems ICD-10 Encounter Code Description Active Date MDM Diagnosis E11.621 Type 2 diabetes mellitus with foot ulcer 08/16/2020 No Yes S81.801A Unspecified open wound, right lower leg, initial encounter 04/08/2021 No Yes L97.522 Non-pressure chronic ulcer of other part of left foot with fat layer exposed 08/16/2020 No Yes E11.42 Type 2 diabetes mellitus with diabetic polyneuropathy 08/16/2020 No Yes Z89.511 Acquired absence of right leg below knee 08/16/2020 No Yes Inactive Problems Resolved Problems Electronic Signature(s) Signed: 04/15/2021 4:37:58 PM By: Kalman Shan DO Entered By: Kalman Shan on 04/15/2021 16:24:33 -------------------------------------------------------------------------------- Progress Note Details Patient Name: Date of Service: [name] [name] [name], [name] [name] [name] L. 04/15/2021 1:30 PM Medical Record Number: [id number] Patient Account Number: [id number] Date of Birth/Sex: Treating RN: Apr 29, 1971 (50 y.o. [name] [name] [name], [name] [name] [name] Primary Care Provider: [name] Other Clinician: Referring Provider: Treating Provider/Extender: Delano Metz in Treatment: 78 Subjective Chief Complaint Information obtained from Patient patient is here for a review of the wound on his plantar left first metatarsal head and back of his right leg History of Present Illness (HPI) 04/21/18 ADMISSION This is a 50 year old man who is a type II diabetic. In spite of fact his hemoglobin A1c is actually quite good 5.73 months ago he is at a really difficult time over the last 6-7 months. He developed a rapidly progressive infection in the right foot in November 2018 associated with osteomyelitis and necrotizing fasciitis. 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 The stump required and a  revision on 11/24/17. The stump revision was advertised as being secondary to falls. I'm not sure the progressive history here however this area is actually  closed over. The patient tells me over the same timeframe he has had wounds on the plantar aspect of his left foot. In the ED saw Dr. Sharol Given in April of this year. 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 It is really not clear that this patient is been dressing this with anything. He came in with the clinic without any specific dressing on the wound areas using his own tennis shoe. He tells me that he only ambulates of course to do a pivot transfer. He does not have his prosthesis for the right leg as of yet and he blames Medicaid for this. He does however use the foot to push himself along in his wheelchair at home. The patient has not had formal arterial studies. ABI in our clinic on the left was 1.13. Patient's past medical history includes type 2 diabetes, fracture of the right fibula. I see that he was treated for abscesses on his right buttock and chest in 2016. I did not look at the microbiology of this. 04/28/18; patient comes back in the clinic today with the wound pretty much the same as when he came in here last week. Small opening lots of undermining relatively. He tells [place] that nothing is really been on this for 3 days in spite of the fact that we gave him enough to dress this easily especially such a small wound. He says he lives with his mother, she is not capable of assisting with this he is changing the dressings himself. 05/05/18; much better-looking wound today. Smaller. There is some undermining medially however that's only perhaps 2 mm. No surrounding erythema. We've been using silver collagen 05/12/18; small wound on the first metatarsal head. No undermining no surrounding erythema. We've been using silver collagen he has home health coming out to change 05/20/18 the wound on the  first metatarsal head looks better. Covered in surface debris/callus nonviable tissue. Required debridement but post debridement this looks quite good. We've been using silver collagen. He has home health 05/27/18; first metatarsal head wound continues to improve. Just about completely closed. Still a lot of surface debridement callus. We've been using silver collagen 06/06/18; the first metatarsal wound is completely healed over. There is still a lot of callus. I gently removed some of this just to make sure that there was no open area and there is not. The patient mentions to me that his left leg has felt like "lead" for about a week READMISSION 08/16/2020 This is a 50 year old man with type 2 diabetes. He returns to clinic today with a 1 month history of a blister and callus over the first metatarsal head. This is in exactly the same area as when he was here in 2019. 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 He has standard running shoes on the left foot to match the area in his prosthesis. He has only been covering this area with a Band-Aid has not really been specifically dressing this. ABI in our clinic on the left was 1.16. This is essentially stable from the value in 2019 10/1 the patient has a linear wound over the left first metatarsal head. Again not a lot of callus thick skin around this that I removed with a #3 curette. This is not go deep to bone or muscle it does not look to be infected. 10/8 first metatarsal head. The wound looks like it is closing  however this is going to be a difficult area to offload in the future. He has a size 15 shoe and he says his shoes are years old. The inserts in these current shoes are totally useless and I have advised him to replace these. He may need a new pair of shoes and I have he got original prosthesis at [facility] [place] I have asked him to go back there. 11/9; the patient has not been here in more  than a month. Not sure he was healed when he was here the last time. I told him he would have to get new shoes and certainly offload the area over the left first metatarsal head. He has done nothing of the kind. He arrives back in clinic with the same old new balance sneakers. A very large separating callus over the first metatarsal head on the left 11/16; he has purchased new shoes and has at least 2 insoles like I asked. The wound is measuring much smaller. He is using silver alginate he changes the dressing himself 11/30; wound is measurably smaller in length of about a half a centimeter. This is generally very little depth. We have been using silver alginate. He changes the dressing himself every second day. 12/14; wound is about the same size. Significant undermining medially relative to the size of the wound. We have been using silver alginate. There is not a way to offload this area as he walks with a prosthesis on the right leg 12/28; wound is about the same size. Again he has undermining medially. I am using polymen on this 12/02/2020; the wound looks smaller. Still a senescent edge. We have been using polymen 1/24; the wound is come down a few millimeters. Still a senescent edge. I have been using polymen 2/8; the wound is come down slightly. Still with slight undermining from 12-6 o'clock I have been using polymen partially to get offloading on this area which is opposed by his prosthesis on the other leg. 2/22 quite open improvement he still has a comma shaped wound on the left first metatarsal head. Some depth. Using polymen 3/14; he still has the same problem of a comma shaped area over the left first metatarsal head. This seems to have had more depth today at 0.6 cm. We have been using polymen. The patient changes this himself every second day. I explored the idea of a total contact cast on the left leg however this is his driving foot. He was not enamored with the idea of not being  able to drive and states that he does not have anybody else to get him around 3/28; again he comes in with a smaller looking wound however there is depth and undermining requiring debridement. We have been using Hydrofera Blue, changed to silver collagen today. The patient is doing the dressing himself 4/11; patient presents today for follow-up of his left diabetic foot wound. He denies any issues in the past 2 weeks. He is currently using silver collagen every other day with dressing changes. He does not use diabetic shoes or special inserts to his sneakers. He does not use felt pads for offloading. 4/19; patient presents for his 1 week follow-up. He denies any issues. He reports using Hydrofera Blue and has not been using silver collagen for dressing changes. He reports minimal drainage to the wound. He overall feels well. 5/3; patient presents for 2-week follow-up. He has been using silver collagen without issues. He has no complaints today and states  he overall feels well. 5/17; patient presents for 2-week follow-up. He states that the sleeve is rubbing up against the back of his right leg and causing a wound. He states this happened a week ago and has not been dressing the wound. The plantar wound is stable and he has been using collagen every other day. 5/24; Patient was had a recent hospitalization for cellulitis of his right popliteal fossa. He was admitted and started on IV antibiotics. He does not recall the plantar wound dressed while inpatient. He feels a lot better today. He was discharged on 5/21 on oral antibiotics and has not picked this up until today. He reports using collagen to the plantar wound since discharge. He is keeping the right posterior knee wound covered. He is getting a new sleeve for his prostatic on 5/26. He currently denies signs of infection. Patient History Information obtained from Patient. Family History Diabetes - Maternal Grandparents,Mother, Heart Disease -  Father, Hypertension - Father, No family history of Cancer, Hereditary Spherocytosis, Kidney Disease, Lung Disease, Seizures, Stroke, Thyroid Problems, Tuberculosis. Social History Current every day smoker, Marital Status - Single, Alcohol Use - Never, Drug Use - No History, Caffeine Use - Never. Medical History Eyes Denies history of Cataracts, Glaucoma, Optic Neuritis Ear/Nose/Mouth/Throat Denies history of Chronic sinus problems/congestion, Middle ear problems Hematologic/Lymphatic Patient has history of Anemia Denies history of Hemophilia, Human Immunodeficiency Virus, Lymphedema, Sickle Cell Disease Respiratory Denies history of Aspiration, Asthma, Chronic Obstructive Pulmonary Disease (COPD), Pneumothorax, Sleep Apnea, Tuberculosis Cardiovascular Patient has history of Hypertension, Peripheral Venous Disease Denies history of Angina, Arrhythmia, Congestive Heart Failure, Coronary Artery Disease, Deep Vein Thrombosis, Hypotension, Myocardial Infarction, Peripheral Arterial Disease, Phlebitis, Vasculitis Gastrointestinal Denies history of Cirrhosis , Colitis, Crohnoos, Hepatitis A, Hepatitis B, Hepatitis C Endocrine Patient has history of Type II Diabetes - oral meds Denies history of Type I Diabetes Genitourinary Denies history of End Stage Renal Disease Immunological Denies history of Lupus Erythematosus, Raynaudoos, Scleroderma Integumentary (Skin) Denies history of History of Burn Musculoskeletal Denies history of Gout, Rheumatoid Arthritis, Osteoarthritis, Osteomyelitis Neurologic Denies history of Dementia, Neuropathy, Quadriplegia, Paraplegia, Seizure Disorder Oncologic Denies history of Received Chemotherapy, Received Radiation Psychiatric Denies history of Anorexia/bulimia, Confinement Anxiety Hospitalization/Surgery History - fever 105, sepsis cellulitis right popliteal fossa 04/09/2021. Medical A Surgical History Notes [name] [name] [name] Constitutional Symptoms (General  Health) falls , leukocytosis Respiratory During waking hours and catches himself not breathing, "gasp for air". Saw Dr Wynonia Lawman, he couldn't find a reason Objective Constitutional respirations regular, non-labored and within target range for patient.. Vitals Time Taken: 1:45 PM, Height: 77 in, Weight: 232 lbs, BMI: 27.5, Temperature: 98.2 F, Pulse: 94 bpm, Respiratory Rate: 20 breaths/min, Blood Pressure: 159/93 mmHg, Capillary Blood Glucose: 90 mg/dl. Cardiovascular 2+ dorsalis pedis/posterior tibialis pulses. Psychiatric pleasant and cooperative. General Notes: Left first metatarsal head: Small opening with circumferential callus And nonviable tissue. Once callus was removed subcutaneous tissue was debrided and good granulation tissue is present. This is larger than previous clinic visit. No acute signs of infection. Posterior right knee: 2 wounds limited to skin breakdown. Integumentary (Hair, Skin) Wound #2R status is Open. Original cause of wound was Blister. The date acquired was: 06/23/2020. The wound has been in treatment 34 weeks. The wound is located on the Left Metatarsal head first. The wound measures 0.6cm length x 0.3cm width x 0.5cm depth; 0.141cm^2 area and 0.071cm^3 volume. There is Fat Layer (Subcutaneous Tissue) exposed. There is no tunneling noted, however, there is undermining starting at 12:00 and ending at  12:00 with a maximum distance of 1.3cm. There is a small amount of serosanguineous drainage noted. The wound margin is well defined and not attached to the wound base. There is large (67-100%) pink, pale granulation within the wound bed. There is no necrotic tissue within the wound bed. General Notes: maceration to periwound. Wound #3 status is Open. Original cause of wound was Pressure Injury. The date acquired was: 03/31/2021. The wound has been in treatment 1 weeks. The wound is located on the Right,Posterior Knee. The wound measures 1.5cm length x 2cm width x 0.1cm  depth; 2.356cm^2 area and 0.236cm^3 volume. There is Fat Layer (Subcutaneous Tissue) exposed. There is no tunneling or undermining noted. There is a medium amount of serous drainage noted. The wound margin is flat and intact. There is small (1-33%) red granulation within the wound bed. There is a large (67-100%) amount of necrotic tissue within the wound bed including Adherent Slough. 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 In fact I recommended antibiotic ointment for preventative measure. T oday the wound looks stable without signs of infection. We prescribed a knee sleeve at last clinic visit as this was causing the wound. He states that he has an appointment this week to get a new one. His plantar foot wound has more maceration to the wound bed. This was debrided. I would like to switch back to silver alginate. He can follow-up in 1 week Procedures Wound #2R Pre-procedure diagnosis of Wound #2R is a Diabetic Wound/Ulcer of the Lower Extremity located on the Left Metatarsal head first .Severity of Tissue Pre Debridement is: Fat layer exposed. There was a Excisional Skin/Subcutaneous Tissue Debridement with a total area of 0.18 sq cm performed by Kalman Shan, DO. With the following instrument(s): Curette to remove Viable and Non-Viable tissue/material. Material removed includes Subcutaneous Tissue, Slough, Skin: Dermis, and Skin: Epidermis after achieving pain control using Lidocaine. No specimens were taken. A time out was conducted at 14:48, prior to the start of the procedure. A  Minimum amount of bleeding was controlled with Pressure. The procedure was tolerated well with a pain level of 0 throughout and a pain level of 0 following the procedure. Post Debridement Measurements: 0.6cm length x 0.3cm width x 0.5cm depth; 0.071cm^3 volume. Character of Wound/Ulcer Post Debridement is improved. Severity of Tissue Post Debridement is: Fat layer exposed. Post procedure Diagnosis Wound #2R: Same as Pre-Procedure Plan Follow-up Appointments: Return Appointment in 1 week. - Dr. Heber [place]!! Bathing/ Shower/ Hygiene: May shower and wash wound with soap and water. - WITH DRESSING CHANGES. Edema Control - Lymphedema / SCD / Other: Elevate legs to the level of the heart or above for 30 minutes daily and/or when sitting, a frequency of: Avoid standing for long periods of time. Off-Loading: Other: - use felt for offloading Additional Orders / Instructions: Other: - Pt. can get a new prostethic sleeve for RIGHT STUMP Non Wound Condition: Other Non Wound Condition Orders/Instructions: - Apply anti-fungal cream to rash on right stump WOUND #2R: - Metatarsal head first Wound Laterality: Left Cleanser: Soap and Water Every Other Day/7 Days Discharge Instructions: May shower and wash wound  with dial antibacterial soap and water prior to dressing change. Cleanser: Wound Cleanser (Generic) Every Other Day/7 Days Discharge Instructions: Cleanse the wound with wound cleanser prior to applying a clean dressing using gauze sponges, not tissue or cotton balls. Peri-Wound Care: Sween Lotion (Moisturizing lotion) Every Other Day/7 Days Discharge Instructions: Apply moisturizing lotion as directed Prim Dressing: KerraCel Ag Gelling Fiber Dressing, 4x5 in (silver alginate) Every Other Day/7 Days ary Discharge Instructions: Apply silver alginate to wound bed as instructed Secondary Dressing: Woven Gauze Sponges 2x2 in (Generic) Every Other Day/7 Days Discharge Instructions: Apply over primary  dressing as directed. Secondary Dressing: Optifoam Non-Adhesive Dressing, 4x4 in (Generic) Every Other Day/7 Days Discharge Instructions: Apply over primary dressing cut to make foam donut to help offload Secured With: Conforming Stretch Gauze Bandage, Sterile 2x75 (in/in) (Generic) Every Other Day/7 Days Discharge Instructions: Secure with stretch gauze as directed. Secured With: 73M Medipore H Soft Cloth Surgical T ape, 2x2 (in/yd) (Generic) Every Other Day/7 Days Discharge Instructions: Secure dressing with tape as directed. WOUND #3: - Knee Wound Laterality: Right, Posterior Cleanser: Soap and Water Every Other Day/7 Days Discharge Instructions: May shower and wash wound with dial antibacterial soap and water prior to dressing change. Cleanser: Wound Cleanser (Generic) Every Other Day/7 Days Discharge Instructions: Cleanse the wound with wound cleanser prior to applying a clean dressing using gauze sponges, not tissue or cotton balls. Topical: Triple Antibiotic Ointment, 1 (oz) Tube Every Other Day/7 Days Secondary Dressing: Woven Gauze Sponge, Non-Sterile 4x4 in (Generic) Every Other Day/7 Days Discharge Instructions: Apply over primary dressing as directed. Secondary Dressing: ABD Pad, 5x9 (Generic) Every Other Day/7 Days Discharge Instructions: Apply over primary dressing as directed. Secured With: [employer], 4.5x3.1 (in/yd) (Generic) Every Other Day/7 Days Discharge Instructions: Secure with Kerlix as directed. 1. Antibiotic ointment and keep covered to the posterior right knee wound 2. Plantar woundoosilver alginate 3. Follow-up in 1 week 4. In office sharp debridement Electronic Signature(s) Signed: 04/15/2021 4:37:58 PM By: Kalman Shan DO Entered By: Kalman Shan on 04/15/2021 16:37:26 -------------------------------------------------------------------------------- HxROS Details Patient Name: Date of Service: [name] [name] [name], [name] [name] [name] L. 04/15/2021 1:30 PM Medical  Record Number: [id number] Patient Account Number: [id number] Date of Birth/Sex: Treating RN: October 28, 1971 (50 y.o. [name] [name] [name] Primary Care Provider: [name] Other Clinician: Referring Provider: Treating Provider/Extender: Delano Metz in Treatment: 25 Information Obtained From Patient Constitutional Symptoms (General Health) Medical History: Past Medical History Notes: falls , leukocytosis Eyes Medical History: Negative for: Cataracts; Glaucoma; Optic Neuritis Ear/Nose/Mouth/Throat Medical History: Negative for: Chronic sinus problems/congestion; Middle ear problems Hematologic/Lymphatic Medical History: Positive for: Anemia Negative for: Hemophilia; Human Immunodeficiency Virus; Lymphedema; Sickle Cell Disease Respiratory Medical History: Negative for: Aspiration; Asthma; Chronic Obstructive Pulmonary Disease (COPD); Pneumothorax; Sleep Apnea; Tuberculosis Past Medical History Notes: During waking hours and catches himself not breathing, "gasp for air". Saw Dr Wynonia Lawman, he couldn't find a reason Cardiovascular Medical History: Positive for: Hypertension; Peripheral Venous Disease Negative for: Angina; Arrhythmia; Congestive Heart Failure; Coronary Artery Disease; Deep Vein Thrombosis; Hypotension; Myocardial Infarction; Peripheral Arterial Disease; Phlebitis; Vasculitis Gastrointestinal Medical History: Negative for: Cirrhosis ; Colitis; Crohns; Hepatitis A; Hepatitis B; Hepatitis C Endocrine Medical History: Positive for: Type II Diabetes - oral meds Negative for: Type I Diabetes Time with diabetes: 2014 Treated with: Oral agents Blood sugar tested every day: Yes Tested : Blood sugar testing results: Breakfast: 91 Genitourinary Medical History: Negative for: End Stage Renal Disease Immunological Medical History: Negative for: Lupus Erythematosus; Raynauds; Scleroderma  Integumentary (Skin) Medical History: Negative for:  History of Burn Musculoskeletal Medical History: Negative for: Gout; Rheumatoid Arthritis; Osteoarthritis; Osteomyelitis Neurologic Medical History: Negative for: Dementia; Neuropathy; Quadriplegia; Paraplegia; Seizure Disorder Oncologic Medical History: Negative for: Received Chemotherapy; Received Radiation Psychiatric Medical History: Negative for: Anorexia/bulimia; Confinement Anxiety Immunizations Pneumococcal Vaccine: Received Pneumococcal Vaccination: No Implantable Devices No devices added Hospitalization / Surgery History Type of Hospitalization/Surgery fever 105, sepsis cellulitis right popliteal fossa 04/09/2021 Family and Social History Cancer: No; Diabetes: Yes - Maternal Grandparents,Mother; Heart Disease: Yes - Father; Hereditary Spherocytosis: No; Hypertension: Yes - Father; Kidney Disease: No; Lung Disease: No; Seizures: No; Stroke: No; Thyroid Problems: No; Tuberculosis: No; Current every day smoker; Marital Status - Single; Alcohol Use: Never; Drug Use: No History; Caffeine Use: Never; Financial Concerns: No; Food, Clothing or Shelter Needs: No; Support System Lacking: No; Transportation Concerns: No Electronic Signature(s) Signed: 04/15/2021 4:37:58 PM By: Kalman Shan DO Signed: 04/15/2021 5:25:37 PM By: Deon Pilling Entered By: Kalman Shan on 04/15/2021 16:26:10 -------------------------------------------------------------------------------- SuperBill Details Patient Name: Date of Service: [name] [name] [name] [name] [name] [name], [name] [name] [name] L. 04/15/2021 Medical Record Number: [id number] Patient Account Number: [id number] Date of Birth/Sex: Treating RN: 1971/06/10 (50 y.o. [name] [name] [name], [name] [name] [name] Primary Care Provider: [name] Other Clinician: Referring Provider: Treating Provider/Extender: Delano Metz in Treatment: 34 Diagnosis Coding ICD-10 Codes Code Description E11.621 Type 2 diabetes mellitus with foot ulcer S81.801A Unspecified open  wound, right lower leg, initial encounter L97.522 Non-pressure chronic ulcer of other part of left foot with fat layer exposed E11.42 Type 2 diabetes mellitus with diabetic polyneuropathy Z89.511 Acquired absence of right leg below knee Facility Procedures CPT4 Code: [id number] Description: 65993 - DEB SUBQ TISSUE 20 SQ CM/< ICD-10 Diagnosis Description L97.522 Non-pressure chronic ulcer of other part of left foot with fat layer exposed Modifier: Quantity: 1 Physician Procedures Electronic Signature(s) Signed: 04/15/2021 4:37:58 PM By: Kalman Shan DO Entered By: Kalman Shan on 04/15/2021 16:37:34

## 2021-04-23 ENCOUNTER — Emergency Department (HOSPITAL_COMMUNITY)
Admission: EM | Admit: 2021-04-23 | Discharge: 2021-04-23 | Disposition: A | Discharge status: Home Health | Payer: Medicare Other | Attending: Emergency Medicine | Admitting: Emergency Medicine

## 2021-04-23 ENCOUNTER — Other Ambulatory Visit: Payer: Self-pay

## 2021-04-23 ENCOUNTER — Encounter (HOSPITAL_BASED_OUTPATIENT_CLINIC_OR_DEPARTMENT_OTHER): Payer: Medicare Other | Attending: Internal Medicine | Admitting: Internal Medicine

## 2021-04-23 ENCOUNTER — Encounter (HOSPITAL_COMMUNITY): Payer: Self-pay | Admitting: *Deleted

## 2021-04-23 DIAGNOSIS — E1142 Type 2 diabetes mellitus with diabetic polyneuropathy: Secondary | ICD-10-CM | POA: Diagnosis not present

## 2021-04-23 DIAGNOSIS — I10 Essential (primary) hypertension: Secondary | ICD-10-CM | POA: Insufficient documentation

## 2021-04-23 DIAGNOSIS — L84 Corns and callosities: Secondary | ICD-10-CM | POA: Insufficient documentation

## 2021-04-23 DIAGNOSIS — Z79899 Other long term (current) drug therapy: Secondary | ICD-10-CM | POA: Insufficient documentation

## 2021-04-23 DIAGNOSIS — Z20822 Contact with and (suspected) exposure to covid-19: Secondary | ICD-10-CM | POA: Insufficient documentation

## 2021-04-23 DIAGNOSIS — Z89511 Acquired absence of right leg below knee: Secondary | ICD-10-CM | POA: Insufficient documentation

## 2021-04-23 DIAGNOSIS — L89899 Pressure ulcer of other site, unspecified stage: Secondary | ICD-10-CM | POA: Diagnosis not present

## 2021-04-23 DIAGNOSIS — F17200 Nicotine dependence, unspecified, uncomplicated: Secondary | ICD-10-CM | POA: Insufficient documentation

## 2021-04-23 DIAGNOSIS — L97522 Non-pressure chronic ulcer of other part of left foot with fat layer exposed: Secondary | ICD-10-CM | POA: Insufficient documentation

## 2021-04-23 DIAGNOSIS — Z7984 Long term (current) use of oral hypoglycemic drugs: Secondary | ICD-10-CM | POA: Insufficient documentation

## 2021-04-23 DIAGNOSIS — E119 Type 2 diabetes mellitus without complications: Secondary | ICD-10-CM | POA: Insufficient documentation

## 2021-04-23 DIAGNOSIS — E11621 Type 2 diabetes mellitus with foot ulcer: Secondary | ICD-10-CM | POA: Diagnosis present

## 2021-04-23 DIAGNOSIS — Z9181 History of falling: Secondary | ICD-10-CM | POA: Insufficient documentation

## 2021-04-23 DIAGNOSIS — K279 Peptic ulcer, site unspecified, unspecified as acute or chronic, without hemorrhage or perforation: Secondary | ICD-10-CM | POA: Insufficient documentation

## 2021-04-23 DIAGNOSIS — Z794 Long term (current) use of insulin: Secondary | ICD-10-CM | POA: Insufficient documentation

## 2021-04-23 DIAGNOSIS — K920 Hematemesis: Secondary | ICD-10-CM | POA: Insufficient documentation

## 2021-04-23 DIAGNOSIS — F17210 Nicotine dependence, cigarettes, uncomplicated: Secondary | ICD-10-CM | POA: Insufficient documentation

## 2021-04-23 LAB — URINALYSIS, ROUTINE W REFLEX MICROSCOPIC
Bacteria, UA: NONE SEEN
Bilirubin Urine: NEGATIVE
Glucose, UA: >=500 mg/dL — AB
Ketones, ur: 5 mg/dL — AB
Leukocytes,Ua: NEGATIVE
Nitrite: NEGATIVE
Protein, ur: >=300 mg/dL — AB
Specific Gravity, Urine: 1.021 (ref 1.005–1.030)
pH: 6 (ref 5.0–8.0)

## 2021-04-23 LAB — COMPREHENSIVE METABOLIC PANEL
ALT: 12 U/L (ref 0–44)
AST: 15 U/L (ref 15–41)
Albumin: 3.3 g/dL — ABNORMAL LOW (ref 3.5–5.0)
Alkaline Phosphatase: 134 U/L — ABNORMAL HIGH (ref 38–126)
Anion gap: 12 (ref 5–15)
BUN: 40 mg/dL — ABNORMAL HIGH (ref 6–20)
CO2: 27 mmol/L (ref 22–32)
Calcium: 8.8 mg/dL — ABNORMAL LOW (ref 8.9–10.3)
Chloride: 105 mmol/L (ref 98–111)
Creatinine, Ser: 2.25 mg/dL — ABNORMAL HIGH (ref 0.61–1.24)
GFR, Estimated: 35 mL/min — ABNORMAL LOW (ref >60)
Glucose, Bld: 254 mg/dL — ABNORMAL HIGH (ref 70–99)
Potassium: 4.2 mmol/L (ref 3.5–5.1)
Sodium: 144 mmol/L (ref 135–145)
Total Bilirubin: 0.6 mg/dL (ref 0.3–1.2)
Total Protein: 7.3 g/dL (ref 6.5–8.1)

## 2021-04-23 LAB — CBC WITH DIFFERENTIAL/PLATELET
Abs Immature Granulocytes: 0.13 10*3/uL — ABNORMAL HIGH (ref 0.00–0.07)
Basophils Absolute: 0 10*3/uL (ref 0.0–0.1)
Basophils Relative: 0 %
Eosinophils Absolute: 0 10*3/uL (ref 0.0–0.5)
Eosinophils Relative: 0 %
HCT: 33.4 % — ABNORMAL LOW (ref 39.0–52.0)
Hemoglobin: 10.5 g/dL — ABNORMAL LOW (ref 13.0–17.0)
Immature Granulocytes: 1 %
Lymphocytes Relative: 8 %
Lymphs Abs: 1.3 10*3/uL (ref 0.7–4.0)
MCH: 28.2 pg (ref 26.0–34.0)
MCHC: 31.4 g/dL (ref 30.0–36.0)
MCV: 89.8 fL (ref 80.0–100.0)
Monocytes Absolute: 0.4 10*3/uL (ref 0.1–1.0)
Monocytes Relative: 3 %
Neutro Abs: 15.5 10*3/uL — ABNORMAL HIGH (ref 1.7–7.7)
Neutrophils Relative %: 88 %
Platelets: 296 10*3/uL (ref 150–400)
RBC: 3.72 MIL/uL — ABNORMAL LOW (ref 4.22–5.81)
RDW: 15 % (ref 11.5–15.5)
WBC: 17.4 10*3/uL — ABNORMAL HIGH (ref 4.0–10.5)
nRBC: 0 % (ref 0.0–0.2)

## 2021-04-23 LAB — RESP PANEL BY RT-PCR (FLU A&B, COVID) ARPGX2
Influenza A by PCR: NEGATIVE
Influenza B by PCR: NEGATIVE
SARS Coronavirus 2 by RT PCR: NEGATIVE

## 2021-04-23 LAB — RAPID URINE DRUG SCREEN, HOSP PERFORMED
Amphetamines: NOT DETECTED
Barbiturates: NOT DETECTED
Benzodiazepines: NOT DETECTED
Cocaine: NOT DETECTED
Opiates: NOT DETECTED
Tetrahydrocannabinol: NOT DETECTED

## 2021-04-23 LAB — GLUCOSE, CAPILLARY: Glucose-Capillary: 236 mg/dL — ABNORMAL HIGH (ref 70–99)

## 2021-04-23 LAB — LIPASE, BLOOD: Lipase: 47 U/L (ref 11–51)

## 2021-04-23 LAB — PROTIME-INR
INR: 1 (ref 0.8–1.2)
Prothrombin Time: 12.7 seconds (ref 11.4–15.2)

## 2021-04-23 LAB — ETHANOL: Alcohol, Ethyl (B): <10 mg/dL (ref <10)

## 2021-04-23 LAB — OCCULT BLOOD GASTRIC / DUODENUM (SPECIMEN CUP)
Occult Blood, Gastric: POSITIVE — AB
pH, Gastric: 1

## 2021-04-23 LAB — TYPE AND SCREEN
ABO/RH(D): A NEG
Antibody Screen: NEGATIVE

## 2021-04-23 MED ORDER — SODIUM CHLORIDE 0.9 % IV BOLUS
1000.0000 mL | Freq: Once | INTRAVENOUS | Status: AC
  Administered 2021-04-23: 1000 mL via INTRAVENOUS

## 2021-04-23 MED ORDER — PANTOPRAZOLE SODIUM 40 MG PO TBEC: 40.0000 mg | DELAYED_RELEASE_TABLET | Freq: Two times a day (BID) | ORAL | 2 refills | Status: DC

## 2021-04-23 MED ORDER — PANTOPRAZOLE SODIUM 40 MG IV SOLR
40.0000 mg | Freq: Once | INTRAVENOUS | Status: AC
  Administered 2021-04-23: 40 mg via INTRAVENOUS
  Filled 2021-04-23: qty 40

## 2021-04-23 MED ORDER — ONDANSETRON HCL 4 MG/2ML IJ SOLN
4.0000 mg | Freq: Once | INTRAMUSCULAR | Status: AC
  Administered 2021-04-23: 4 mg via INTRAVENOUS
  Filled 2021-04-23: qty 2

## 2021-04-23 NOTE — ED Notes (Signed)
Patient tolerated PO challenge well.  

## 2021-04-23 NOTE — ED Provider Notes (Signed)
[facility] DEPT Provider Note   CSN: [id number] Arrival date & time: 04/23/21  1357     History Chief Complaint  Patient presents with  . Hematemesis    [name] [name] is a 50 y.o. male.  HPI He presents for evaluation of vomiting which started within the last 24 hours, initially was food and fluid from the stomach, then turned bloody.  He has had numerous episodes of bloody vomiting.  He denies fever, chills, chest pain, shortness of breath, cough, change in bowel or urine habits.  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  He is seeing wound care actively, saw him today prior to coming here.  At this time he remains on antibiotics following hospital discharge, and is doing home dressing changes by himself.  He states he has never had hematemesis before and has never had an upper endoscopy.  He does not drink alcohol, or use anticoagulants.  There are no other known active modifying factors.    Past Medical History:  Diagnosis Date  . Allergy    seasonal and environmental  . ARF (acute renal failure) ([facility]) 09/2017  . Chronic constipation 05/13/2020  . Depression   . Diabetes mellitus without complication ([facility]) 1884  . Hyperlipidemia   . Hypertension   . Necrotizing fasciitis ([facility]) 10/13/2017  . Neuromuscular disorder ([facility])    neuropathy  . Wound dehiscence 11/24/2017    Patient Active Problem List   Diagnosis Date Noted  . Cellulitis 04/10/2021  . Severe sepsis ([place]) 04/10/2021  . AKI (acute kidney injury) ([facility]) 04/10/2021  . Cellulitis of both lower extremities 04/10/2021  . 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 Non-pressure chronic ulcer of other part of left foot limited to breakdown of skin ([facility]) 02/02/2018  . 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 History of right below knee amputation ([facility]) 12/22/2017  . Fever   . Acute  posthemorrhagic anemia   . Leukocytosis   . Falls 11/24/2017  . Benign essential HTN 11/24/2017  . Normocytic normochromic anemia 11/24/2017  . Sepsis ([place]) 10/11/2017  . Uncontrolled type 2 diabetes mellitus with hyperglycemia ([place]) 10/11/2017  . ARF (acute renal failure) ([facility]) 10/11/2017  . Nausea & vomiting 10/11/2017    Past Surgical History:  Procedure Laterality Date  . AMPUTATION Right 10/13/2017   Procedure: RIGHT BELOW KNEE AMPUTATION;  Surgeon: [name], MD;  Location: [facility];  Service: Orthopedics;  Laterality: Right;  . AMPUTATION Right 11/24/2017   Procedure: AMPUTATION BELOW KNEE REVISION;  Surgeon: [name], MD;  Location: [facility];  Service: Orthopedics;  Laterality: Right;  . COLONOSCOPY  05/11/2019  . NO PAST SURGERIES    . POLYPECTOMY    . WISDOM TOOTH EXTRACTION         Family History  Problem Relation Age of Onset  . Diabetes Mellitus II Mother   . Depression Mother   . Colon polyps Mother   . Heart disease Father        CABG x 4.  04/2017  . Colon cancer Neg Hx   . Esophageal cancer Neg Hx   . Liver cancer Neg Hx   . Pancreatic cancer Neg Hx   . Rectal cancer Neg Hx   . Stomach cancer Neg Hx     Social History   Tobacco Use  . Smoking status: Current Every Day Smoker  Packs/day: 0.25    Years: 35.00    Pack years: 8.75    Types: Cigarettes  . Smokeless tobacco: Never Used  . Tobacco comment: stopped, but back on 1 cigarette daily.  Vaping Use  . Vaping Use: Never used  Substance Use Topics  . Alcohol use: No  . Drug use: No    Home Medications Prior to Admission medications   Medication Sig Start Date End Date Taking? Authorizing Provider  pantoprazole (PROTONIX) 40 MG tablet Take 1 tablet (40 mg total) by mouth 2 (two) times daily. 04/23/21  Yes Daleen Bo, MD  Truddie Crumble ULTRA-THIN LANCETS MISC Check blood sugars before meals twice daily 11/19/17   Mack Hook, MD  amLODipine (NORVASC) 10 MG tablet Take 10 mg by  mouth every morning.     [provider]  ARIPiprazole (ABILIFY) 20 MG tablet Take 20 mg by mouth every morning. 08/06/19   [provider]  atorvastatin (LIPITOR) 20 MG tablet Take 20 mg by mouth every morning. 06/07/19   [provider]  baclofen (LIORESAL) 10 MG tablet 1/2 tab twice a day Patient taking differently: Take 20 mg by mouth 2 (two) times daily. 01/28/18   Mack Hook, MD  bisacodyl (DULCOLAX) 5 MG EC tablet Take 5 mg by mouth daily as needed for moderate constipation.    [provider]  Blood Glucose Monitoring Suppl (AGAMATRIX PRESTO) w/Device KIT Check sugars twice daily before meals 11/19/17   Mack Hook, MD  Cholecalciferol-Vitamin C (VITAMIN D3-VITAMIN C PO) Take 1 tablet by mouth at bedtime.    [provider]  dapagliflozin propanediol (FARXIGA) 10 MG TABS tablet Take 10 mg by mouth daily. 04/08/20   [provider]  desvenlafaxine (PRISTIQ) 100 MG 24 hr tablet Take 100 mg by mouth daily. 02/20/20   [provider]  docusate sodium (COLACE) 100 MG capsule Take 1 capsule (100 mg total) by mouth 2 (two) times daily. Patient taking differently: Take 200 mg by mouth daily as needed. 11/30/17   Kinnie Feil, MD  doxycycline (VIBRA-TABS) 100 MG tablet Take 1 tablet (100 mg total) by mouth 2 (two) times daily. 04/12/21   Charlynne Cousins, MD  Dulaglutide (TRULICITY) 8.18 HU/3.1SH SOPN Inject 0.75 mg into the skin once a week. Patient taking differently: Inject 0.75 mg into the skin once a week. Friday 03/21/21   Renato Shin, MD  DULoxetine (CYMBALTA) 60 MG capsule Take 60 mg by mouth 2 (two) times daily.  01/04/19   [provider]  famotidine (PEPCID) 20 MG tablet Take 1 tablet (20 mg total) by mouth 2 (two) times daily. Patient not taking: Reported on 04/10/2021 07/04/20   Ok Edwards, PA-C  FLUoxetine (PROZAC) 40 MG capsule Take 40 mg by mouth every morning. 03/26/21   [provider]   fluticasone (FLONASE) 50 MCG/ACT nasal spray Place 2 sprays into both nostrils daily as needed for allergies or rhinitis.  01/09/19   [provider]  gabapentin (NEURONTIN) 300 MG capsule 1 cap by mouth in morning and midday, 2 caps by mouth at bedtime Patient taking differently: Take 300-600 mg by mouth See admin instructions. Take one capsule (300 mg) by mouth in the morning and afternoon, take two capsules (600 mg) at bedtime 11/19/17   Mack Hook, MD  glipiZIDE (GLUCOTROL) 5 MG tablet 1/2 tab by mouth twice daily with meal Patient taking differently: Take 5 mg by mouth daily. With dinner meal 11/19/17   Mack Hook, MD  glucose blood (  AGAMATRIX PRESTO TEST) test strip Check blood sugars twice daily before meals 02/02/18   Mack Hook, MD  lamoTRIgine (LAMICTAL) 100 MG tablet Take 100 mg by mouth 2 (two) times daily. 04/07/21   [provider]  lisinopril (ZESTRIL) 20 MG tablet Take 1 tablet (20 mg total) by mouth daily. 04/22/21   Charlynne Cousins, MD  metFORMIN (GLUCOPHAGE-XR) 500 MG 24 hr tablet Take 4 tablets (2,000 mg total) by mouth daily with breakfast. Patient taking differently: Take 1,000 mg by mouth 2 (two) times daily with a meal. 03/21/21   Renato Shin, MD  metoCLOPramide (REGLAN) 5 MG tablet Take 1 tablet (5 mg total) by mouth 3 (three) times daily before meals for 10 days. 07/04/20 07/14/20  Tasia Catchings, Amy V, PA-C  metoprolol tartrate (LOPRESSOR) 25 MG tablet Take 25 mg by mouth 2 (two) times daily. 02/21/21   [provider]  ondansetron (ZOFRAN) 8 MG tablet Take 8 mg by mouth every 8 (eight) hours as needed for nausea or vomiting. 12/01/19   [provider]  polyethylene glycol (MIRALAX / GLYCOLAX) 17 g packet Take 17 g by mouth daily. Patient not taking: No sig reported 04/28/19   Doran Stabler, MD    Allergies    Adhesive [tape]  Review of Systems   Review of Systems  All other systems reviewed and are  negative.   Physical Exam Updated Vital Signs BP (!) 177/97   Pulse 90   Temp 98 F (36.7 C) (Oral)   Resp 18   SpO2 96%   Physical Exam Vitals and nursing note reviewed.  Constitutional:      General: He is in acute distress (Uncomfortable).     Appearance: He is well-developed. He is not ill-appearing, toxic-appearing or diaphoretic.  HENT:     Head: Normocephalic and atraumatic.     Right Ear: External ear normal.     Left Ear: External ear normal.  Eyes:     Conjunctiva/sclera: Conjunctivae normal.     Pupils: Pupils are equal, round, and reactive to light.  Neck:     Trachea: Phonation normal.  Cardiovascular:     Rate and Rhythm: Normal rate and regular rhythm.     Heart sounds: Normal heart sounds.  Pulmonary:     Effort: Pulmonary effort is normal.     Breath sounds: Normal breath sounds.  Abdominal:     General: There is no distension.     Palpations: Abdomen is soft. There is no mass.     Tenderness: There is no abdominal tenderness.     Hernia: No hernia is present.     Comments: Actively vomiting dark bloody emesis.  Musculoskeletal:        General: Normal range of motion.     Cervical back: Normal range of motion and neck supple.     Right lower leg: No edema.     Left lower leg: No edema.     Comments: Right leg BKA, wearing prosthesis  Skin:    General: Skin is warm and dry.  Neurological:     Mental Status: He is alert and oriented to person, place, and time.     Cranial Nerves: No cranial nerve deficit.     Sensory: No sensory deficit.     Motor: No abnormal muscle tone.     Coordination: Coordination normal.  Psychiatric:        Mood and Affect: Mood normal.        Behavior: Behavior normal.  Thought Content: Thought content normal.        Judgment: Judgment normal.     ED Results / Procedures / Treatments   Labs (all labs ordered are listed, but only abnormal results are displayed) Labs Reviewed  COMPREHENSIVE METABOLIC PANEL -  Abnormal; Notable for the following components:      Result Value   Glucose, Bld 254 (*)    BUN 40 (*)    Creatinine, Ser 2.25 (*)    Calcium 8.8 (*)    Albumin 3.3 (*)    Alkaline Phosphatase 134 (*)    GFR, Estimated 35 (*)    All other components within normal limits  CBC WITH DIFFERENTIAL/PLATELET - Abnormal; Notable for the following components:   WBC 17.4 (*)    RBC 3.72 (*)    Hemoglobin 10.5 (*)    HCT 33.4 (*)    Neutro Abs 15.5 (*)    Abs Immature Granulocytes 0.13 (*)    All other components within normal limits  URINALYSIS, ROUTINE W REFLEX MICROSCOPIC - Abnormal; Notable for the following components:   Glucose, UA >=500 (*)    Hgb urine dipstick SMALL (*)    Ketones, ur 5 (*)    Protein, ur >=300 (*)    All other components within normal limits  OCCULT BLOOD GASTRIC / DUODENUM (SPECIMEN CUP) - Abnormal; Notable for the following components:   Occult Blood, Gastric POSITIVE (*)    All other components within normal limits  RESP PANEL BY RT-PCR (FLU A&B, COVID) ARPGX2  LIPASE, BLOOD  ETHANOL  PROTIME-INR  RAPID URINE DRUG SCREEN, HOSP PERFORMED  TYPE AND SCREEN    EKG None  Radiology No results found.  Procedures Procedures   Medications Ordered in ED Medications  sodium chloride 0.9 % bolus 1,000 mL (0 mLs Intravenous Stopped 04/23/21 1931)  ondansetron (ZOFRAN) injection 4 mg (4 mg Intravenous Given 04/23/21 1723)  pantoprazole (PROTONIX) injection 40 mg (40 mg Intravenous Given 04/23/21 1722)  ondansetron (ZOFRAN) injection 4 mg (4 mg Intravenous Given 04/23/21 1937)    ED Course  I have reviewed the triage vital signs and the nursing notes.  Pertinent labs & imaging results that were available during my care of the patient were reviewed by me and considered in my medical decision making (see chart for details).    MDM Rules/Calculators/A&P                           Patient Vitals for the past 24 hrs:  BP Temp Temp src Pulse Resp SpO2  04/23/21  2230 (!) 177/97 -- -- 90 -- 96 %  04/23/21 2200 (!) 164/96 -- -- (!) 101 18 97 %  04/23/21 2145 -- -- -- 100 -- 99 %  04/23/21 2130 (!) 172/93 -- -- 95 -- 97 %  04/23/21 2115 -- -- -- 100 -- 100 %  04/23/21 2100 (!) 194/95 -- -- (!) 101 16 94 %  04/23/21 2045 -- -- -- 95 -- 94 %  04/23/21 2030 (!) 176/96 -- -- 93 -- 96 %  04/23/21 2015 -- -- -- 98 -- 92 %  04/23/21 2000 (!) 183/97 -- -- 93 16 93 %  04/23/21 1945 -- -- -- 98 -- 95 %  04/23/21 1930 (!) 168/90 -- -- 94 -- 95 %  04/23/21 1915 -- -- -- 97 -- 97 %  04/23/21 1900 (!) 177/89 -- -- 93 16 95 %  04/23/21 1845 -- -- --  92 -- 96 %  04/23/21 1830 (!) 174/85 -- -- 93 -- 96 %  04/23/21 1815 -- -- -- 93 -- 97 %  04/23/21 1800 (!) 171/86 -- -- 91 -- 98 %  04/23/21 1745 -- -- -- 93 -- 98 %  04/23/21 1730 (!) 168/95 -- -- 93 16 96 %  04/23/21 1715 -- -- -- 97 -- 98 %  04/23/21 1700 (!) 158/89 -- -- 96 -- 96 %  04/23/21 1645 -- -- -- 99 -- 98 %  04/23/21 1630 (!) 187/163 -- -- 96 16 94 %  04/23/21 1403 (!) 146/87 98 F (36.7 C) Oral 96 18 97 %    8:38 PM Reevaluation with update and discussion. After initial assessment and treatment, an updated evaluation reveals he is comfortable at this time has no further complaints.  He has not vomited since I saw him, at 4:30 PM.  I have asked nurse to give him a oral fluid trial. Daleen Bo   10:45 PM-ordering oral fluids without vomiting.  Medical Decision Making:  This patient is presenting for evaluation of hematemesis, which does require a range of treatment options, and is a complaint that involves a high risk of morbidity and mortality. The differential diagnoses include peptic ulcer disease esophageal disorder, gastric neoplasm. I decided to review old records, and in summary middle-aged male presenting with acute onset of vomiting followed by hematemesis, today.  He states he is not a drinker has no prior liver disease.  He does not take anticoagulants..  I did not require additional  historical information from anyone.  Clinical Laboratory Tests Ordered, included CBC, Metabolic panel, Urinalysis and UDS, type and screen, alcohol level, INR. Review indicates normal except glucose high, BUN high, creatinine high, calcium low, albumin low, alk phos stays high, GFR low, white count high, hemoglobin low, urinalysis abnormal.  Gastric occult blood positive.  Viral panel negative  Critical Interventions-clinical evaluation, laboratory evaluation, medication treatment, IV fluids, observation reassessment  After These Interventions, the Patient was reevaluated and was found improved, vomiting controlled, stable for discharge.  Hemoglobin stable.  Hemodynamically stable.  No ongoing symptoms.  CRITICAL CARE- yes Performed by: Daleen Bo  Nursing Notes Reviewed/ Care Coordinated Applicable Imaging Reviewed Interpretation of Laboratory Data incorporated into ED treatment  The patient appears reasonably screened and/or stabilized for discharge and I doubt any other medical condition or other [facility] requiring further screening, evaluation, or treatment in the ED at this time prior to discharge.  Plan: Home Medications-continue usual; Home Treatments-gradual advance diet; return here if the recommended treatment, does not improve the symptoms; Recommended follow up-GI follow-up as soon as possible.  PCP for checkup next week.     Final Clinical Impression(s) / ED Diagnoses Final diagnoses:  Hematemesis, presence of nausea not specified  Peptic ulcer disease    Rx / DC Orders ED Discharge Orders         Ordered    pantoprazole (PROTONIX) 40 MG tablet  2 times daily        04/23/21 2252           Daleen Bo, MD 04/23/21 2305

## 2021-04-23 NOTE — ED Provider Notes (Signed)
Emergency Medicine Provider Triage Evaluation Note  [name] [name] , a 50 y.o. male  was evaluated in triage.  Pt complains of bloody emesis since yesterday.  Reports dark red-colored emesis.  No abdominal pain.  States that he had this issue before in the past but this resolved without intervention or evaluation.  No changes to bowel movements and no bloody stools.  No history of PUD that he is aware of.  No chest pain, fever or shortness of breath.  Review of Systems  Positive: Abdominal pain, hematemesis Negative: Chest pain, shortness of breath, changes to bowel movement  Physical Exam  BP (!) 146/87 (BP Location: Right Arm)   Pulse 96   Temp 98 F (36.7 C) (Oral)   Resp 18   SpO2 97%  Gen:   Awake, no distress   Resp:  Normal effort  MSK:   Moves extremities without difficulty  Other:  Abdomen is soft, nontender nondistended  Medical Decision Making  Medically screening exam initiated at 2:09 PM.  Appropriate orders placed.  [name] was informed that the remainder of the evaluation will be completed by another provider, this initial triage assessment does not replace that evaluation, and the importance of remaining in the ED until their evaluation is complete.  Will order lab work including type and screen   Delia Heady, PA-C 04/23/21 1409    Hayden Rasmussen, MD 04/25/21 [phone]

## 2021-04-23 NOTE — Progress Notes (Signed)
MERWIN, BREDEN ([id number]) Visit Report for 04/23/2021 Chief Complaint Document Details Patient Name: Date of Service: [name] [name] 04/23/2021 12:45 PM Medical Record Number: [id number] Patient Account Number: [id number] Date of Birth/Sex: Treating RN: 1971-04-19 (50 y.o. [name] [name] Primary Care Provider: [name] Other Clinician: Referring Provider: Treating Provider/Extender: Delano Metz in Treatment: 35 Information Obtained from: Patient Chief Complaint patient is here for a review of the wound on his plantar left first metatarsal head and back of his right leg Electronic Signature(s) Signed: 04/23/2021 1:40:15 PM By: Kalman Shan DO Entered By: Kalman Shan on 04/23/2021 13:31:48 -------------------------------------------------------------------------------- Debridement Details Patient Name: Date of Service: [name] [name], [name] [name] L. 04/23/2021 12:45 PM Medical Record Number: [id number] Patient Account Number: [id number] Date of Birth/Sex: Treating RN: 1971/04/14 (50 y.o. [name] [name] Primary Care Provider: [name] Other Clinician: Referring Provider: Treating Provider/Extender: Delano Metz in Treatment: 35 Debridement Performed for Assessment: Wound #2R Left Metatarsal head first Performed By: Physician Kalman Shan, DO Debridement Type: Debridement Severity of Tissue Pre Debridement: Fat layer exposed Level of Consciousness (Pre-procedure): Awake and Alert Pre-procedure Verification/Time Out Yes - 13:15 Taken: Start Time: 13:15 T Area Debrided (L x W): otal 1 (cm) x 2 (cm) = 2 (cm) Tissue and other material debrided: Viable, Non-Viable, Callus, Slough, Subcutaneous, Skin: Epidermis, Slough Level: Skin/Subcutaneous Tissue Debridement Description: Excisional Instrument: Blade, Curette, Forceps, Scissors Bleeding: Minimum Hemostasis Achieved: Pressure End Time:  13:23 Procedural Pain: 0 Post Procedural Pain: 0 Response to Treatment: Procedure was tolerated well Level of Consciousness (Post- Awake and Alert procedure): Post Debridement Measurements of Total Wound Length: (cm) 0.8 Width: (cm) 1.3 Depth: (cm) 0.1 Volume: (cm) 0.082 Character of Wound/Ulcer Post Debridement: Improved Severity of Tissue Post Debridement: Fat layer exposed Post Procedure Diagnosis Same as Pre-procedure Electronic Signature(s) Signed: 04/23/2021 1:40:15 PM By: Kalman Shan DO Signed: 04/23/2021 6:06:42 PM By: Baruch Gouty RN, BSN Entered By: Baruch Gouty on 04/23/2021 13:24:04 -------------------------------------------------------------------------------- HPI Details Patient Name: Date of Service: [name] [name], [name] [name] L. 04/23/2021 12:45 PM Medical Record Number: [id number] Patient Account Number: [id number] Date of Birth/Sex: Treating RN: [birth year] (50 y.o. [name] [name] Primary Care Provider: [name] Other Clinician: Referring Provider: Treating Provider/Extender: Delano Metz in Treatment: 56 History of Present Illness HPI Description: 04/21/18 ADMISSION This is a 50 year old man who is a type II diabetic. In spite of fact his hemoglobin A1c is actually quite good 5.73 months ago he is at a really difficult time over the last 6-7 months. He developed a rapidly progressive infection in the right foot in November 2018 associated with osteomyelitis and necrotizing fasciitis. 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 The stump required and a revision on 11/24/17. The stump revision was advertised as being secondary to falls. I'm not sure the progressive history here however this area is actually closed over. The patient tells me over the same timeframe he has had wounds on the plantar aspect of his left foot. In the ED saw Dr. Sharol Given in April of this year. 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 It is really not clear that this patient is been dressing this with anything. He came in with the clinic without any specific dressing on the wound areas using his own tennis shoe. He tells me that he only ambulates of course to do a pivot transfer. He does not have his prosthesis for  the right leg as of yet and he blames Medicaid for this. He does however use the foot to push himself along in his wheelchair at home. The patient has not had formal arterial studies. ABI in our clinic on the left was 1.13. Patient's past medical history includes type 2 diabetes, fracture of the right fibula. I see that he was treated for abscesses on his right buttock and chest in 2016. I did not look at the microbiology of this. 04/28/18; patient comes back in the clinic today with the wound pretty much the same as when he came in here last week. Small opening lots of undermining relatively. He tells [place] that nothing is really been on this for 3 days in spite of the fact that we gave him enough to dress this easily especially such a small wound. He says he lives with his mother, she is not capable of assisting with this he is changing the dressings himself. 05/05/18; much better-looking wound today. Smaller. There is some undermining medially however that's only perhaps 2 mm. No surrounding erythema. We've been using silver collagen 05/12/18; small wound on the first metatarsal head. No undermining no surrounding erythema. We've been using silver collagen he has home health coming out to change 05/20/18 the wound on the first metatarsal head looks better. Covered in surface debris/callus nonviable tissue. Required debridement but post debridement this looks quite good. We've been using silver collagen. He has home health 05/27/18; first metatarsal head wound continues to improve. Just about completely closed. Still a lot of surface debridement callus. We've been using silver collagen 06/06/18; the first  metatarsal wound is completely healed over. There is still a lot of callus. I gently removed some of this just to make sure that there was no open area and there is not. The patient mentions to me that his left leg has felt like "lead" for about a week READMISSION 08/16/2020 This is a 50 year old man with type 2 diabetes. He returns to clinic today with a 1 month history of a blister and callus over the first metatarsal head. This is in exactly the same area as when he was here in 2019. 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 He has standard running shoes on the left foot to match the area in his prosthesis. He has only been covering this area with a Band-Aid has not really been specifically dressing this. ABI in our clinic on the left was 1.16. This is essentially stable from the value in 2019 10/1 the patient has a linear wound over the left first metatarsal head. Again not a lot of callus thick skin around this that I removed with a #3 curette. This is not go deep to bone or muscle it does not look to be infected. 10/8 first metatarsal head. The wound looks like it is closing however this is going to be a difficult area to offload in the future. He has a size 15 shoe and he says his shoes are years old. The inserts in these current shoes are totally useless and I have advised him to replace these. He may need a new pair of shoes and I have he got original prosthesis at [facility] [place] I have asked him to go back there. 11/9; the patient has not been here in more than a month. Not sure he was healed when he was here the last time. I told him he would  have to get new shoes and certainly offload the area over the left first metatarsal head. He has done nothing of the kind. He arrives back in clinic with the same old new balance sneakers. A very large separating callus over the first metatarsal head on the left 11/16; he has purchased new shoes and  has at least 2 insoles like I asked. The wound is measuring much smaller. He is using silver alginate he changes the dressing himself 11/30; wound is measurably smaller in length of about a half a centimeter. This is generally very little depth. We have been using silver alginate. He changes the dressing himself every second day. 12/14; wound is about the same size. Significant undermining medially relative to the size of the wound. We have been using silver alginate. There is not a way to offload this area as he walks with a prosthesis on the right leg 12/28; wound is about the same size. Again he has undermining medially. I am using polymen on this 12/02/2020; the wound looks smaller. Still a senescent edge. We have been using polymen 1/24; the wound is come down a few millimeters. Still a senescent edge. I have been using polymen 2/8; the wound is come down slightly. Still with slight undermining from 12-6 o'clock I have been using polymen partially to get offloading on this area which is opposed by his prosthesis on the other leg. 2/22 quite open improvement he still has a comma shaped wound on the left first metatarsal head. Some depth. Using polymen 3/14; he still has the same problem of a comma shaped area over the left first metatarsal head. This seems to have had more depth today at 0.6 cm. We have been using polymen. The patient changes this himself every second day. I explored the idea of a total contact cast on the left leg however this is his driving foot. He was not enamored with the idea of not being able to drive and states that he does not have anybody else to get him around 3/28; again he comes in with a smaller looking wound however there is depth and undermining requiring debridement. We have been using Hydrofera Blue, changed to silver collagen today. The patient is doing the dressing himself 4/11; patient presents today for follow-up of his left diabetic foot wound. He denies  any issues in the past 2 weeks. He is currently using silver collagen every other day with dressing changes. He does not use diabetic shoes or special inserts to his sneakers. He does not use felt pads for offloading. 4/19; patient presents for his 1 week follow-up. He denies any issues. He reports using Hydrofera Blue and has not been using silver collagen for dressing changes. He reports minimal drainage to the wound. He overall feels well. 5/3; patient presents for 2-week follow-up. He has been using silver collagen without issues. He has no complaints today and states he overall feels well. 5/17; patient presents for 2-week follow-up. He states that the sleeve is rubbing up against the back of his right leg and causing a wound. He states this happened a week ago and has not been dressing the wound. The plantar wound is stable and he has been using collagen every other day. 5/24; Patient was had a recent hospitalization for cellulitis of his right popliteal fossa. He was admitted and started on IV antibiotics. He does not recall the plantar wound dressed while inpatient. He feels a lot better today. He was discharged on 5/21 on oral  antibiotics and has not picked this up until today. He reports using collagen to the plantar wound since discharge. He is keeping the right posterior knee wound covered. He is getting a new sleeve for his prostatic on 5/26. He currently denies signs of infection. 6/1; patient presents for 1 week follow-up. He is still on doxycycline. He has not been dressing the back of the right knee wound. He states he is receiving his new prosthetic sleeve tomorrow. He states he is using silver alginate to the plantar foot wound. He currently denies signs of infection. He states he has not been offloading the left foot wound Electronic Signature(s) Signed: 04/23/2021 1:40:15 PM By: Kalman Shan DO Entered By: Kalman Shan on 04/23/2021  13:35:10 -------------------------------------------------------------------------------- Physical Exam Details Patient Name: Date of Service: [name] [name], [name] [name] L. 04/23/2021 12:45 PM Medical Record Number: [id number] Patient Account Number: [id number] Date of Birth/Sex: Treating RN: July 02, 1971 (50 y.o. [name] [name] Primary Care Provider: [name] Other Clinician: Referring Provider: Treating Provider/Extender: Delano Metz in Treatment: 35 Constitutional respirations regular, non-labored and within target range for patient.Marland [facility] pleasant and cooperative. Notes Left first met head: Maceration noted around the wound bed with callus. There is nonviable tissue in the wound bed. Post debridement I open this area up completely. Good granulation tissue present. Posterior right knee: 2 wounds with skin breakdown deeper than last clinic visit. Electronic Signature(s) Signed: 04/23/2021 1:40:15 PM By: Kalman Shan DO Entered By: Kalman Shan on 04/23/2021 13:36:20 -------------------------------------------------------------------------------- Physician Orders Details Patient Name: Date of Service: [name] [name], [name] [name] L. 04/23/2021 12:45 PM Medical Record Number: [id number] Patient Account Number: [id number] Date of Birth/Sex: Treating RN: [birth year] (50 y.o. 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 - Dr. Heber [place]way!! Tuesday Return A Bathing/ Shower/ Hygiene May shower and wash wound with soap and water. - WITH DRESSING CHANGES. Edema Control - Lymphedema / SCD / Other Elevate legs to the level of the heart or above for 30 minutes daily and/or when sitting, a frequency of: Avoid standing for long periods of time. Off-Loading Other: - use felt for offloading Additional Orders / Instructions Other: - Pt. can get a new prostethic sleeve for RIGHT STUMP Non Wound Condition Other Non Wound Condition Orders/Instructions: - Apply anti-fungal cream to rash on right stump daily Wound Treatment Wound #2R - Metatarsal head first Wound Laterality: Left Cleanser: Soap and Water 1 x Per Day/15 Days Discharge Instructions: May shower and wash wound with dial antibacterial soap and water prior to dressing change. Cleanser: Wound Cleanser (Generic) 1 x Per Day/15 Days Discharge Instructions: Cleanse the wound with wound cleanser prior to applying a clean dressing using gauze sponges, not tissue or cotton balls. Peri-Wound Care: Sween Lotion (Moisturizing lotion) 1 x Per Day/15 Days Discharge Instructions: Apply moisturizing lotion as directed Prim Dressing: KerraCel Ag Gelling Fiber Dressing, 4x5 in (silver alginate) 1 x Per Day/15 Days ary Discharge Instructions: Apply silver alginate to wound bed as instructed Secondary Dressing: Woven Gauze Sponges 2x2 in (Generic) 1 x Per  Day/15 Days Discharge Instructions: Apply over primary dressing as directed. Secondary Dressing: Optifoam Non-Adhesive Dressing, 4x4 in (Generic) 1 x Per Day/15 Days Discharge Instructions: Apply over primary dressing cut to make foam donut to help offload Secured With: Conforming Stretch Gauze Bandage, Sterile 2x75 (in/in) (Generic) 1 x Per Day/15 Days Discharge Instructions: Secure with stretch gauze as directed. Secured With: 46M Medipore H Soft Cloth Surgical Tape, 2x2 (in/yd) (Generic) 1 x  Per Day/15 Days Discharge Instructions: Secure dressing with tape as directed. Wound #3 - Knee Wound Laterality: Right, Posterior Cleanser: Soap and Water 1 x Per HUD/14 Days Discharge Instructions: May shower and wash wound with dial antibacterial soap and water prior to dressing change. Cleanser: Wound Cleanser (Generic) 1 x Per Day/15 Days Discharge Instructions: Cleanse the wound with wound cleanser prior to applying a clean dressing using gauze sponges, not tissue or cotton balls. Topical: Triple Antibiotic Ointment, 1 (oz) Tube 1 x Per Day/15 Days Secondary Dressing: Zetuvit Plus Silicone Border Dressing 4x4 (in/in) 1 x Per Day/15 Days Discharge Instructions: Apply silicone border over primary dressing as directed. Electronic Signature(s) Signed: 04/23/2021 1:40:15 PM By: Kalman Shan DO Entered By: Kalman Shan on 04/23/2021 13:36:34 Prescription 04/23/2021 -------------------------------------------------------------------------------- Gonsalves, Dandrae L. Kalman Shan DO Patient Name: Provider: 11-17-1971 [id number] Date of Birth: NPI#: [name] [id number] Sex: DEA #: [phone] Phone #: License #: [place] Patient Address: [address] [place], [place] [address] [place], [place] [address] [phone] Allergies adhesive tape Provider's Orders Other: - Pt. can get a new prostethic sleeve for RIGHT STUMP Hand Signature: Date(s): Electronic Signature(s) Signed: 04/23/2021 1:40:15 PM By: Kalman Shan DO Entered By: Kalman Shan on 04/23/2021 13:36:34 -------------------------------------------------------------------------------- Problem List Details Patient Name: Date of Service: [name] [name], [name] [name] L. 04/23/2021 12:45 PM Medical Record Number: [id number] Patient Account Number: [id number] Date of Birth/Sex: Treating RN: 26-Jan-1971 (50 y.o. [name] [name] Primary Care  Provider: [name] Other Clinician: Referring Provider: Treating Provider/Extender: Delano Metz in Treatment: 16 Active Problems ICD-10 Encounter Code Description Active Date MDM Diagnosis E11.621 Type 2 diabetes mellitus with foot ulcer 08/16/2020 No Yes S81.801A Unspecified open wound, right lower leg, initial encounter 04/08/2021 No Yes E11.42 Type 2 diabetes mellitus with diabetic polyneuropathy 08/16/2020 No Yes Z89.511 Acquired absence of right leg below knee 08/16/2020 No Yes L97.522 Non-pressure chronic ulcer of other part of left foot with fat layer exposed 08/16/2020 No Yes Inactive Problems Resolved Problems Electronic Signature(s) Signed: 04/23/2021 1:40:15 PM By: Kalman Shan DO Entered By: Kalman Shan on 04/23/2021 13:31:12 -------------------------------------------------------------------------------- Progress Note Details Patient Name: Date of Service: [name] [name], [name] [name] L. 04/23/2021 12:45 PM Medical Record Number: [id number] Patient Account Number: [id number] Date of Birth/Sex: Treating RN: 12-05-1970 (50 y.o. [name] [name] Primary Care Provider: [name] Other Clinician: Referring Provider: Treating Provider/Extender: Delano Metz in Treatment: 57 Subjective Chief Complaint Information obtained from Patient patient is here for a review of the wound on his plantar left first metatarsal head and back of his right leg History of Present Illness (HPI) 04/21/18 ADMISSION This is a 50 year old man who is a type II diabetic. In spite of fact his hemoglobin A1c is actually quite good 5.73 months ago he is at a really difficult time over the last 6-7 months. He developed a rapidly progressive infection in the right foot in November 2018 associated with osteomyelitis and necrotizing fasciitis. He had a right BKA on November  21 18. The stump required and a revision on 11/24/17. The stump revision was  advertised as being secondary to falls. I'm not sure the progressive history here however this area is actually closed over. The patient tells me over the same timeframe he has had wounds on the plantar aspect of his left foot. In the ED saw Dr. Sharol Given in April of this year. 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 It is really not clear that this patient is been dressing this with anything. He came in with the clinic without any specific dressing on the wound areas using his own tennis shoe. He tells me that he only ambulates of course to do a pivot transfer. He does not have his prosthesis for the right leg as of yet and he blames Medicaid for this. He does however use the foot to push himself along in his wheelchair at home. The patient has not had formal arterial studies. ABI in our clinic on the left was 1.13. Patient's past medical history includes type 2 diabetes, fracture of the right fibula. I see that he was treated for abscesses on his right buttock and chest in 2016. I did not look at the microbiology of this. 04/28/18; patient comes back in the clinic today with the wound pretty much the same as when he came in here last week. Small opening lots of undermining relatively. He tells [place] that nothing is really been on this for 3 days in spite of the fact that we gave him enough to dress this easily especially such a small wound. He says he lives with his mother, she is not capable of assisting with this he is changing the dressings himself. 05/05/18; much better-looking wound today. Smaller. There is some undermining medially however that's only perhaps 2 mm. No surrounding erythema. We've been using silver collagen 05/12/18; small wound on the first metatarsal head. No undermining no surrounding erythema. We've been using silver collagen he has home health coming out to change 05/20/18 the wound on the first metatarsal head looks better. Covered in  surface debris/callus nonviable tissue. Required debridement but post debridement this looks quite good. We've been using silver collagen. He has home health 05/27/18; first metatarsal head wound continues to improve. Just about completely closed. Still a lot of surface debridement callus. We've been using silver collagen 06/06/18; the first metatarsal wound is completely healed over. There is still a lot of callus. I gently removed some of this just to make sure that there was no open area and there is not. The patient mentions to me that his left leg has felt like "lead" for about a week READMISSION 08/16/2020 This is a 50 year old man with type 2 diabetes. He returns to clinic today with a 1 month history of a blister and callus over the first metatarsal head. This is in exactly the same area as when he was here in 2019. 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 He has standard running shoes on the left foot to match the area in his prosthesis. He has only been covering this area with a Band-Aid has not really been specifically dressing this. ABI in our clinic on the left was 1.16. This is essentially stable from the value in 2019 10/1 the patient has a linear wound over the left first metatarsal head. Again not a lot of callus thick skin around this that  I removed with a #3 curette. This is not go deep to bone or muscle it does not look to be infected. 10/8 first metatarsal head. The wound looks like it is closing however this is going to be a difficult area to offload in the future. He has a size 15 shoe and he says his shoes are years old. The inserts in these current shoes are totally useless and I have advised him to replace these. He may need a new pair of shoes and I have he got original prosthesis at [facility] I have asked him to go back there. 11/9; the patient has not been here in more than a month. Not sure he was healed when he  was here the last time. I told him he would have to get new shoes and certainly offload the area over the left first metatarsal head. He has done nothing of the kind. He arrives back in clinic with the same old new balance sneakers. A very large separating callus over the first metatarsal head on the left 11/16; he has purchased new shoes and has at least 2 insoles like I asked. The wound is measuring much smaller. He is using silver alginate he changes the dressing himself 11/30; wound is measurably smaller in length of about a half a centimeter. This is generally very little depth. We have been using silver alginate. He changes the dressing himself every second day. 12/14; wound is about the same size. Significant undermining medially relative to the size of the wound. We have been using silver alginate. There is not a way to offload this area as he walks with a prosthesis on the right leg 12/28; wound is about the same size. Again he has undermining medially. I am using polymen on this 12/02/2020; the wound looks smaller. Still a senescent edge. We have been using polymen 1/24; the wound is come down a few millimeters. Still a senescent edge. I have been using polymen 2/8; the wound is come down slightly. Still with slight undermining from 12-6 o'clock I have been using polymen partially to get offloading on this area which is opposed by his prosthesis on the other leg. 2/22 quite open improvement he still has a comma shaped wound on the left first metatarsal head. Some depth. Using polymen 3/14; he still has the same problem of a comma shaped area over the left first metatarsal head. This seems to have had more depth today at 0.6 cm. We have been using polymen. The patient changes this himself every second day. I explored the idea of a total contact cast on the left leg however this is his driving foot. He was not enamored with the idea of not being able to drive and states that he does not have  anybody else to get him around 3/28; again he comes in with a smaller looking wound however there is depth and undermining requiring debridement. We have been using Hydrofera Blue, changed to silver collagen today. The patient is doing the dressing himself 4/11; patient presents today for follow-up of his left diabetic foot wound. He denies any issues in the past 2 weeks. He is currently using silver collagen every other day with dressing changes. He does not use diabetic shoes or special inserts to his sneakers. He does not use felt pads for offloading. 4/19; patient presents for his 1 week follow-up. He denies any issues. He reports using Hydrofera Blue and has not been using silver collagen for dressing  changes. He reports minimal drainage to the wound. He overall feels well. 5/3; patient presents for 2-week follow-up. He has been using silver collagen without issues. He has no complaints today and states he overall feels well. 5/17; patient presents for 2-week follow-up. He states that the sleeve is rubbing up against the back of his right leg and causing a wound. He states this happened a week ago and has not been dressing the wound. The plantar wound is stable and he has been using collagen every other day. 5/24; Patient was had a recent hospitalization for cellulitis of his right popliteal fossa. He was admitted and started on IV antibiotics. He does not recall the plantar wound dressed while inpatient. He feels a lot better today. He was discharged on 5/21 on oral antibiotics and has not picked this up until today. He reports using collagen to the plantar wound since discharge. He is keeping the right posterior knee wound covered. He is getting a new sleeve for his prostatic on 5/26. He currently denies signs of infection. 6/1; patient presents for 1 week follow-up. He is still on doxycycline. He has not been dressing the back of the right knee wound. He states he is receiving his new  prosthetic sleeve tomorrow. He states he is using silver alginate to the plantar foot wound. He currently denies signs of infection. He states he has not been offloading the left foot wound Patient History Information obtained from Patient. Family History Diabetes - Maternal Grandparents,Mother, Heart Disease - Father, Hypertension - Father, No family history of Cancer, Hereditary Spherocytosis, Kidney Disease, Lung Disease, Seizures, Stroke, Thyroid Problems, Tuberculosis. Social History Current every day smoker, Marital Status - Single, Alcohol Use - Never, Drug Use - No History, Caffeine Use - Never. Medical History Eyes Denies history of Cataracts, Glaucoma, Optic Neuritis Ear/Nose/Mouth/Throat Denies history of Chronic sinus problems/congestion, Middle ear problems Hematologic/Lymphatic Patient has history of Anemia Denies history of Hemophilia, Human Immunodeficiency Virus, Lymphedema, Sickle Cell Disease Respiratory Denies history of Aspiration, Asthma, Chronic Obstructive Pulmonary Disease (COPD), Pneumothorax, Sleep Apnea, Tuberculosis Cardiovascular Patient has history of Hypertension, Peripheral Venous Disease Denies history of Angina, Arrhythmia, Congestive Heart Failure, Coronary Artery Disease, Deep Vein Thrombosis, Hypotension, Myocardial Infarction, Peripheral Arterial Disease, Phlebitis, Vasculitis Gastrointestinal Denies history of Cirrhosis , Colitis, Crohnoos, Hepatitis A, Hepatitis B, Hepatitis C Endocrine Patient has history of Type II Diabetes - oral meds Denies history of Type I Diabetes Genitourinary Denies history of End Stage Renal Disease Immunological Denies history of Lupus Erythematosus, Raynaudoos, Scleroderma Integumentary (Skin) Denies history of History of Burn Musculoskeletal Denies history of Gout, Rheumatoid Arthritis, Osteoarthritis, Osteomyelitis Neurologic Denies history of Dementia, Neuropathy, Quadriplegia, Paraplegia, Seizure  Disorder Oncologic Denies history of Received Chemotherapy, Received Radiation Psychiatric Denies history of Anorexia/bulimia, Confinement Anxiety Hospitalization/Surgery History - fever 105, sepsis cellulitis right popliteal fossa 04/09/2021. Medical A Surgical History Notes [name] Constitutional Symptoms (General Health) falls , leukocytosis Respiratory During waking hours and catches himself not breathing, "gasp for air". Saw Dr Wynonia Lawman, he couldn't find a reason Objective Constitutional respirations regular, non-labored and within target range for patient.. Vitals Time Taken: 12:46 PM, Height: 77 in, Weight: 232 lbs, BMI: 27.5, Temperature: 98.1 F, Pulse: 98 bpm, Respiratory Rate: 17 breaths/min, Blood Pressure: 124/74 mmHg, Capillary Blood Glucose: 236 mg/dl. General Notes: patient reports no food since yesterday. Blood sugar 55 today. Recheck in office 236. Patient states not feeling well today. Psychiatric pleasant and cooperative. General Notes: Left first met head: Maceration noted around the wound bed  with callus. There is nonviable tissue in the wound bed. Post debridement I open this area up completely. Good granulation tissue present. Posterior right knee: 2 wounds with skin breakdown deeper than last clinic visit. Integumentary (Hair, Skin) Wound #2R status is Open. Original cause of wound was Blister. The date acquired was: 06/23/2020. The wound has been in treatment 35 weeks. The wound is located on the Left Metatarsal head first. The wound measures 0.8cm length x 0.8cm width x 0.2cm depth; 0.503cm^2 area and 0.101cm^3 volume. There is Fat Layer (Subcutaneous Tissue) exposed. There is no tunneling or undermining noted. There is a medium amount of serosanguineous drainage noted. The wound margin is well defined and not attached to the wound base. There is large (67-100%) pink, pale granulation within the wound bed. There is no necrotic tissue within the wound bed. General Notes:  macerated periwound. Wound #3 status is Open. Original cause of wound was Pressure Injury. The date acquired was: 03/31/2021. The wound has been in treatment 2 weeks. The wound is located on the Right,Posterior Knee. The wound measures 1.2cm length x 2cm width x 0.3cm depth; 1.885cm^2 area and 0.565cm^3 volume. There is Fat Layer (Subcutaneous Tissue) exposed. There is no tunneling noted, however, there is undermining starting at 12:00 and ending at 12:00 with a maximum distance of 0.2cm. There is a medium amount of serosanguineous drainage noted. The wound margin is flat and intact. There is small (1-33%) red granulation within the wound bed. There is a large (67-100%) amount of necrotic tissue within the wound bed including Adherent Slough. 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 Unfortunately the prosthetic sleeve has caused the wound to the posterior right knee. It is taken a while to obtain his new sleeve. I am hopeful that when he does get it this it will help resolve the issue. He does not seem to dress this area despite our conversations about using antibiotic ointment and keeping the area padded. The plantar wound was macerated and I was able to remove nonviable tissue. He is using silver alginate to the wound bed. He has a flat affect and I am really not sure he is participating in his wound care As he does not seem to be engaged at all during our clinic visits. We discussed aggressive offloading. Currently there are no acute signs of infection. I will see him back in a week Procedures Wound #2R Pre-procedure diagnosis of Wound #2R is a Diabetic Wound/Ulcer of the Lower  Extremity located on the Left Metatarsal head first .Severity of Tissue Pre Debridement is: Fat layer exposed. There was a Excisional Skin/Subcutaneous Tissue Debridement with a total area of 2 sq cm performed by Kalman Shan, DO. With the following instrument(s): Blade, Curette, Forceps, and Scissors to remove Viable and Non-Viable tissue/material. Material removed includes Callus, Subcutaneous Tissue, Slough, and Skin: Epidermis. No specimens were taken. A time out was conducted at 13:15, prior to the start of the procedure. A Minimum amount of bleeding was controlled with Pressure. The procedure was tolerated well with a pain level of 0 throughout and a pain level of 0 following the procedure. Post Debridement Measurements: 0.8cm length x 1.3cm width x 0.1cm depth; 0.082cm^3 volume. Character of Wound/Ulcer Post Debridement  is improved. Severity of Tissue Post Debridement is: Fat layer exposed. Post procedure Diagnosis Wound #2R: Same as Pre-Procedure Plan Follow-up Appointments: Return Appointment in 1 week. - Dr. Heber Sentinel!! Tuesday Bathing/ Shower/ Hygiene: May shower and wash wound with soap and water. - WITH DRESSING CHANGES. Edema Control - Lymphedema / SCD / Other: Elevate legs to the level of the heart or above for 30 minutes daily and/or when sitting, a frequency of: Avoid standing for long periods of time. Off-Loading: Other: - use felt for offloading Additional Orders / Instructions: Other: - Pt. can get a new prostethic sleeve for RIGHT STUMP Non Wound Condition: Other Non Wound Condition Orders/Instructions: - Apply anti-fungal cream to rash on right stump daily WOUND #2R: - Metatarsal head first Wound Laterality: Left Cleanser: Soap and Water 1 x Per Day/15 Days Discharge Instructions: May shower and wash wound with dial antibacterial soap and water prior to dressing change. Cleanser: Wound Cleanser (Generic) 1 x Per Day/15 Days Discharge Instructions: Cleanse the wound  with wound cleanser prior to applying a clean dressing using gauze sponges, not tissue or cotton balls. Peri-Wound Care: Sween Lotion (Moisturizing lotion) 1 x Per Day/15 Days Discharge Instructions: Apply moisturizing lotion as directed Prim Dressing: KerraCel Ag Gelling Fiber Dressing, 4x5 in (silver alginate) 1 x Per Day/15 Days ary Discharge Instructions: Apply silver alginate to wound bed as instructed Secondary Dressing: Woven Gauze Sponges 2x2 in (Generic) 1 x Per Day/15 Days Discharge Instructions: Apply over primary dressing as directed. Secondary Dressing: Optifoam Non-Adhesive Dressing, 4x4 in (Generic) 1 x Per Day/15 Days Discharge Instructions: Apply over primary dressing cut to make foam donut to help offload Secured With: Conforming Stretch Gauze Bandage, Sterile 2x75 (in/in) (Generic) 1 x Per Day/15 Days Discharge Instructions: Secure with stretch gauze as directed. Secured With: 41M Medipore H Soft Cloth Surgical T ape, 2x2 (in/yd) (Generic) 1 x Per Day/15 Days Discharge Instructions: Secure dressing with tape as directed. WOUND #3: - Knee Wound Laterality: Right, Posterior Cleanser: Soap and Water 1 x Per EQA/83 Days Discharge Instructions: May shower and wash wound with dial antibacterial soap and water prior to dressing change. Cleanser: Wound Cleanser (Generic) 1 x Per Day/15 Days Discharge Instructions: Cleanse the wound with wound cleanser prior to applying a clean dressing using gauze sponges, not tissue or cotton balls. Topical: Triple Antibiotic Ointment, 1 (oz) Tube 1 x Per Day/15 Days Secondary Dressing: Zetuvit Plus Silicone Border Dressing 4x4 (in/in) 1 x Per Day/15 Days Discharge Instructions: Apply silicone border over primary dressing as directed. 1. Antibiotic ointment and padding to the right posterior knee wound 2. In office sharp debridement to the plantar foot wound 3. Silver alginateoofoot wound 4. Follow-up in 1 week Electronic Signature(s) Signed:  04/23/2021 1:40:15 PM By: Kalman Shan DO Entered By: Kalman Shan on 04/23/2021 13:39:29 -------------------------------------------------------------------------------- HxROS Details Patient Name: Date of Service: [name] [name], [name] [name] L. 04/23/2021 12:45 PM Medical Record Number: [id number] Patient Account Number: [id number] Date of Birth/Sex: Treating RN: 1970/12/04 (50 y.o. [name] [name] Primary Care Provider: [name] Other Clinician: Referring Provider: Treating Provider/Extender: Delano Metz in Treatment: 108 Information Obtained From Patient Constitutional Symptoms (General Health) Medical History: Past Medical History Notes: falls , leukocytosis Eyes Medical History: Negative for: Cataracts; Glaucoma; Optic Neuritis Ear/Nose/Mouth/Throat Medical History: Negative for: Chronic sinus problems/congestion; Middle ear problems Hematologic/Lymphatic Medical History: Positive for: Anemia Negative for: Hemophilia; Human Immunodeficiency Virus; Lymphedema; Sickle Cell Disease Respiratory Medical History: Negative for: Aspiration; Asthma; Chronic Obstructive Pulmonary Disease (COPD);  Pneumothorax; Sleep Apnea; Tuberculosis Past Medical History Notes: During waking hours and catches himself not breathing, "gasp for air". Saw Dr Wynonia Lawman, he couldn't find a reason Cardiovascular Medical History: Positive for: Hypertension; Peripheral Venous Disease Negative for: Angina; Arrhythmia; Congestive Heart Failure; Coronary Artery Disease; Deep Vein Thrombosis; Hypotension; Myocardial Infarction; Peripheral Arterial Disease; Phlebitis; Vasculitis Gastrointestinal Medical History: Negative for: Cirrhosis ; Colitis; Crohns; Hepatitis A; Hepatitis B; Hepatitis C Endocrine Medical History: Positive for: Type II Diabetes - oral meds Negative for: Type I Diabetes Time with diabetes: 2014 Treated with: Oral agents Blood sugar tested every day:  Yes Tested : Blood sugar testing results: Breakfast: 91 Genitourinary Medical History: Negative for: End Stage Renal Disease Immunological Medical History: Negative for: Lupus Erythematosus; Raynauds; Scleroderma Integumentary (Skin) Medical History: Negative for: History of Burn Musculoskeletal Medical History: Negative for: Gout; Rheumatoid Arthritis; Osteoarthritis; Osteomyelitis Neurologic Medical History: Negative for: Dementia; Neuropathy; Quadriplegia; Paraplegia; Seizure Disorder Oncologic Medical History: Negative for: Received Chemotherapy; Received Radiation Psychiatric Medical History: Negative for: Anorexia/bulimia; Confinement Anxiety Immunizations Pneumococcal Vaccine: Received Pneumococcal Vaccination: No Implantable Devices No devices added Hospitalization / Surgery History Type of Hospitalization/Surgery fever 105, sepsis cellulitis right popliteal fossa 04/09/2021 Family and Social History Cancer: No; Diabetes: Yes - Maternal Grandparents,Mother; Heart Disease: Yes - Father; Hereditary Spherocytosis: No; Hypertension: Yes - Father; Kidney Disease: No; Lung Disease: No; Seizures: No; Stroke: No; Thyroid Problems: No; Tuberculosis: No; Current every day smoker; Marital Status - Single; Alcohol Use: Never; Drug Use: No History; Caffeine Use: Never; Financial Concerns: No; Food, Clothing or Shelter Needs: No; Support System Lacking: No; Transportation Concerns: No Electronic Signature(s) Signed: 04/23/2021 1:40:15 PM By: Kalman Shan DO Signed: 04/23/2021 6:06:42 PM By: Baruch Gouty RN, BSN Entered By: Kalman Shan on 04/23/2021 13:35:19 -------------------------------------------------------------------------------- [facility] Details Patient Name: Date of Service: [name] [name], [name] [name] L. 04/23/2021 Medical Record Number: [id number] Patient Account Number: [id number] Date of Birth/Sex: Treating RN: 12/14/1970 (50 y.o. [name] [name] Primary Care  Provider: [name] Other Clinician: Referring Provider: Treating Provider/Extender: Delano Metz in Treatment: 35 Diagnosis Coding ICD-10 Codes Code Description E11.621 Type 2 diabetes mellitus with foot ulcer S81.801A Unspecified open wound, right lower leg, initial encounter L97.522 Non-pressure chronic ulcer of other part of left foot with fat layer exposed E11.42 Type 2 diabetes mellitus with diabetic polyneuropathy Z89.511 Acquired absence of right leg below knee Facility Procedures CPT4 Code: [id number] Description: [place] - DEB SUBQ TISSUE 20 SQ CM/< ICD-10 Diagnosis Description L97.522 Non-pressure chronic ulcer of other part of left foot with fat layer exposed Modifier: Quantity: 1 Physician Procedures : CPT4 Code Description Modifier [id number] - WC PHYS SUBQ TISS 20 SQ CM ICD-10 Diagnosis Description L97.522 Non-pressure chronic ulcer of other part of left foot with fat layer exposed Quantity: 1 Electronic Signature(s) Signed: 04/23/2021 1:40:15 PM By: Kalman Shan DO Entered By: Kalman Shan on 04/23/2021 13:39:36

## 2021-04-23 NOTE — ED Triage Notes (Signed)
Pt complains of blood in vomit since last night.  No abdominal pain or blood in stool.

## 2021-04-23 NOTE — Discharge Instructions (Addendum)
It appears that you are vomiting from an ulcer.  We are starting a medicine, which was sent to your pharmacy to help that.  Start with a clear liquid diet then gradually advance to regular foods over a few days.  Call the gastroenterologist for a follow-up appointment soon as possible.  Also see your primary care doctor for checkup next week.  Return here, if needed.

## 2021-04-24 NOTE — Progress Notes (Addendum)
TEDD, COTTRILL ([id number]) Visit Report for 04/23/2021 Arrival Information Details Patient Name: Date of Service: [name] [name] 04/23/2021 12:45 PM Medical Record Number: [id number] Patient Account Number: [id number] Date of Birth/Sex: Treating RN: Oct 21, 1971 (50 y.o. [name] [name], [name] [name] Primary Care Nimra Puccinelli: [name] Other Clinician: Referring Jaslyne Beeck: Treating [name]/Extender: Delano Metz in Treatment: 37 Visit Information History Since Last Visit Added or deleted any medications: No Patient Arrived: Ambulatory Any new allergies or adverse reactions: No Arrival Time: 12:46 Had a fall or experienced change in No Accompanied By: self activities of daily living that may affect Transfer Assistance: None risk of falls: Patient Identification Verified: Yes Signs or symptoms of abuse/neglect since last visito No Secondary Verification Process Completed: Yes Hospitalized since last visit: No Patient Requires Transmission-Based Precautions: No Implantable device outside of the clinic excluding No Patient Has Alerts: No cellular tissue based products placed in the center since last visit: Has Dressing in Place as Prescribed: Yes Pain Present Now: No Electronic Signature(s) Signed: 04/24/2021 12:00:56 PM By: Rhae Hammock RN Entered By: Rhae Hammock on 04/23/2021 12:46:28 -------------------------------------------------------------------------------- Lower Extremity Assessment Details Patient Name: Date of Service: STRICKLA [name], [name] 04/23/2021 12:45 PM Medical Record Number: [id number] Patient Account Number: [id number] Date of Birth/Sex: Treating RN: 02-Aug-1971 (50 y.o. [name] [name], [name] [name] Primary Care Hilda Wexler: [name] Other Clinician: Referring Yerik Zeringue: Treating Mattheo Swindle/Extender: Delano Metz in Treatment: 35 Edema Assessment Assessed: [name]: No] [name]: No] Edema: [Left: Ye]  [Right: s] Calf Left: Right: Point of Measurement: 50 cm From Medial Instep 40 cm Ankle Left: Right: Point of Measurement: 11 cm From Medial Instep 29 cm Vascular Assessment Pulses: Dorsalis Pedis Palpable: [Left:Yes] Posterior Tibial Palpable: [Left:Yes] Electronic Signature(s) Signed: 04/24/2021 12:00:56 PM By: Rhae Hammock RN Entered By: Rhae Hammock on 04/23/2021 12:49:40 -------------------------------------------------------------------------------- Multi Wound Chart Details Patient Name: Date of Service: [name], [name] 04/23/2021 12:45 PM Medical Record Number: [id number] Patient Account Number: [id number] Date of Birth/Sex: Treating RN: Jun 02, 1971 (50 y.o. [name] [name] Primary Care Amos Micheals: [name] Other Clinician: Referring [place] Gustafson: Treating Damonica Chopra/Extender: Delano Metz in Treatment: 35 Vital Signs Height(in): 77 Capillary Blood Glucose(mg/dl): 236 Weight(lbs): 232 Pulse(bpm): 14 Body Mass Index(BMI): 28 Blood Pressure(mmHg): 124/74 Temperature(F): 98.1 Respiratory Rate(breaths/min): 17 Photos: [2R:No Photos Left Metatarsal head first] [3:No Photos Right, Posterior Knee] [N/A:N/A N/A] Wound Location: [2R:Blister] [3:Pressure Injury] [N/A:N/A] Wounding Event: [2R:Diabetic Wound/Ulcer of the Lower] [3:Pressure Ulcer] [N/A:N/A] Primary Etiology: [2R:Extremity Anemia, Hypertension, Peripheral] [3:Anemia, Hypertension, Peripheral] [N/A:N/A] Comorbid History: [2R:Venous Disease, Type II Diabetes 06/23/2020] [3:Venous Disease, Type II Diabetes 03/31/2021] [N/A:N/A] Date Acquired: [2R:35] [3:2] [N/A:N/A] Weeks of Treatment: [2R:Open] [3:Open] [N/A:N/A] Wound Status: [2R:Yes] [3:No] [N/A:N/A] Wound Recurrence: [2R:No] [3:Yes] [N/A:N/A] Clustered Wound: [2R:N/A] [3:2] [N/A:N/A] Clustered Quantity: [2R:0.8x0.8x0.2] [3:1.2x2x0.3] [N/A:N/A] Measurements L x W x D (cm) [2R:0.503] [3:1.885] [N/A:N/A] A (cm) : rea  [2R:0.101] [3:0.565] [N/A:N/A] Volume (cm) : [2R:-1522.60%] [3:-15.90%] [N/A:N/A] % Reduction in A [2R:rea: -3266.70%] [3:-246.60%] [N/A:N/A] % Reduction in Volume: [3:12] Starting Position 1 (o'clock): [3:12] Ending Position 1 (o'clock): [3:0.2] Maximum Distance 1 (cm): [2R:No] [3:Yes] [N/A:N/A] Undermining: [2R:Grade 2] [3:Unstageable/Unclassified] [N/A:N/A] Classification: [2R:Medium] [3:Medium] [N/A:N/A] Exudate A mount: [2R:Serosanguineous] [3:Serosanguineous] [N/A:N/A] Exudate Type: [2R:red, brown] [3:red, brown] [N/A:N/A] Exudate Color: [2R:Well defined, not attached] [3:Flat and Intact] [N/A:N/A] Wound Margin: [2R:Large (67-100%)] [3:Small (1-33%)] [N/A:N/A] Granulation A mount: [2R:Pink, Pale] [3:Red] [N/A:N/A] Granulation Quality: [2R:None Present (0%)] [3:Large (67-100%)] [N/A:N/A] Necrotic A mount: [2R:Fat Layer (Subcutaneous Tissue): Yes Fat Layer (Subcutaneous Tissue): Yes  N/A] Exposed Structures: [2R:Fascia: No Tendon: No Muscle: No Joint: No Bone: No Small (1-33%)] [3:Fascia: No Tendon: No Muscle: No Joint: No Bone: No None] [N/A:N/A] Epithelialization: [2R:Debridement - Excisional] [3:N/A] [N/A:N/A] Debridement: Pre-procedure Verification/Time Out 13:15 [3:N/A] [N/A:N/A] Taken: [2R:Callus, Subcutaneous, Slough] [3:N/A] [N/A:N/A] Tissue Debrided: [2R:Skin/Subcutaneous Tissue] [3:N/A] [N/A:N/A] Level: [2R:2] [3:N/A] [N/A:N/A] Debridement A (sq cm): [2R:rea Blade, Curette, Forceps, Scissors] [3:N/A] [N/A:N/A] Instrument: [2R:Minimum] [3:N/A] [N/A:N/A] Bleeding: [2R:Pressure] [3:N/A] [N/A:N/A] Hemostasis A chieved: [2R:0] [3:N/A] [N/A:N/A] Procedural Pain: [2R:0] [3:N/A] [N/A:N/A] Post Procedural Pain: [2R:Procedure was tolerated well] [3:N/A] [N/A:N/A] Debridement Treatment Response: [2R:0.8x1.3x0.1] [3:N/A] [N/A:N/A] Post Debridement Measurements L x W x D (cm) [2R:0.082] [3:N/A] [N/A:N/A] Post Debridement Volume: (cm) [2R:macerated periwound.] [3:N/A]  [N/A:N/A] Assessment Notes: [2R:Debridement] [3:N/A] [N/A:N/A] Treatment Notes Electronic Signature(s) Signed: 04/23/2021 1:40:15 PM By: Kalman Shan DO Signed: 04/23/2021 6:06:42 PM By: Baruch Gouty RN, BSN Entered By: Kalman Shan on 04/23/2021 13:31:17 -------------------------------------------------------------------------------- Multi-Disciplinary Care Plan Details Patient Name: Date of Service: [name], [name] 04/23/2021 12:45 PM Medical Record Number: [id number] Patient Account Number: [id number] Date of Birth/Sex: Treating RN: 05/13/1971 (50 y.o. [name] [name] Primary Care Demeka Sutter: [name] Other Clinician: Referring Lorik Guo: Treating Karey Suthers/Extender: Delano Metz in Treatment: [place] reviewed with physician Active Inactive Wound/Skin Impairment Nursing Diagnoses: Impaired tissue integrity Goals: Patient/caregiver will verbalize understanding of skin care regimen Date Initiated: 11/19/2020 Target Resolution Date: 05/16/2021 Goal Status: Active Ulcer/skin breakdown will have a volume reduction of 30% by week 4 Date Initiated: 08/16/2020 Date Inactivated: 03/03/2021 Target Resolution Date: 03/10/2021 Goal Status: Met Interventions: Provide education on ulcer and skin care Notes: Electronic Signature(s) Signed: 04/23/2021 6:06:42 PM By: Baruch Gouty RN, BSN Entered By: Baruch Gouty on 04/23/2021 13:16:51 -------------------------------------------------------------------------------- Pain Assessment Details Patient Name: Date of Service: [name], [name] 04/23/2021 12:45 PM Medical Record Number: [id number] Patient Account Number: [id number] Date of Birth/Sex: Treating RN: 05-31-1971 (50 y.o. [name] [name], [name] [name] Primary Care Jonalyn Sedlak: [name] Other Clinician: Referring Bibi Economos: Treating Keonta Alsip/Extender: Delano Metz in Treatment: 91 Active  Problems Location of Pain Severity and Description of Pain Patient Has Paino No Site Locations Pain Management and Medication Current Pain Management: Electronic Signature(s) Signed: 04/24/2021 12:00:56 PM By: Rhae Hammock RN Entered By: Rhae Hammock on 04/23/2021 12:49:32 -------------------------------------------------------------------------------- Patient/Caregiver Education Details Patient Name: Date of Service: [name] [name] 6/1/2022andnbsp12:45 PM Medical Record Number: [id number] Patient Account Number: [id number] Date of Birth/Gender: Treating RN: 1971/05/04 (50 y.o. [name] [name] Primary Care Physician: [name] Other Clinician: Referring Physician: Treating Physician/Extender: Delano Metz in Treatment: 34 Education Assessment Education Provided To: Patient Education Topics Provided Offloading: Methods: Explain/Verbal Responses: Reinforcements needed, State content correctly Wound/Skin Impairment: Methods: Explain/Verbal Responses: Reinforcements needed, State content correctly Electronic Signature(s) Signed: 04/23/2021 6:06:42 PM By: Baruch Gouty RN, BSN Entered By: Baruch Gouty on 04/23/2021 13:17:30 -------------------------------------------------------------------------------- Wound Assessment Details Patient Name: Date of Service: [name], [name] 04/23/2021 12:45 PM Medical Record Number: [id number] Patient Account Number: [id number] Date of Birth/Sex: Treating RN: Oct 31, 1971 (50 y.o. [name] [name], [name] [name] Primary Care Blaize Nipper: [name] Other Clinician: Referring Erskine Steinfeldt: Treating Herminio Kniskern/Extender: Delano Metz in Treatment: 35 Wound Status Wound Number: 2R Primary Diabetic Wound/Ulcer of the Lower Extremity Etiology: Wound Location: Left Metatarsal head first Wound Status: Open Wounding Event: Blister Comorbid Anemia, Hypertension, Peripheral  Venous Disease, Type II Date Acquired: 06/23/2020 History: Diabetes Weeks Of Treatment: 35 Clustered Wound: No Photos Wound Measurements Length: (cm) 0.8  Width: (cm) 0.8 Depth: (cm) 0.2 Area: (cm) 0.503 Volume: (cm) 0.101 % Reduction in Area: -1522.6% % Reduction in Volume: -3266.7% Epithelialization: Small (1-33%) Tunneling: No Undermining: No Wound Description Classification: Grade 2 Wound Margin: Well defined, not attached Exudate Amount: Medium Exudate Type: Serosanguineous Exudate Color: red, brown Foul Odor After Cleansing: No Slough/Fibrino No Wound Bed Granulation Amount: Large (67-100%) Exposed Structure Granulation Quality: Pink, Pale Fascia Exposed: No Necrotic Amount: None Present (0%) Fat Layer (Subcutaneous Tissue) Exposed: Yes Tendon Exposed: No Muscle Exposed: No Joint Exposed: No Bone Exposed: No Assessment Notes macerated periwound. Electronic Signature(s) Signed: 04/24/2021 1:41:47 PM By: Sandre Kitty Signed: 04/28/2021 5:37:32 PM By: Rhae Hammock RN Previous Signature: 04/24/2021 12:00:56 PM Version By: Rhae Hammock RN Entered By: Sandre Kitty on 04/24/2021 13:03:13 -------------------------------------------------------------------------------- Wound Assessment Details Patient Name: Date of Service: STRICKLA [name], [name] 04/23/2021 12:45 PM Medical Record Number: [id number] Patient Account Number: [id number] Date of Birth/Sex: Treating RN: 13-Feb-1971 (50 y.o. [name] [name], [name] [name] Primary Care Damontae Loppnow: [name] Other Clinician: Referring Attilio Zeitler: Treating Charletta Voight/Extender: Delano Metz in Treatment: 35 Wound Status Wound Number: 3 Primary Pressure Ulcer Etiology: Wound Location: Right, Posterior Knee Wound Status: Open Wounding Event: Pressure Injury Comorbid Anemia, Hypertension, Peripheral Venous Disease, Type II Date Acquired: 03/31/2021 History: Diabetes Weeks Of Treatment:  2 Clustered Wound: Yes Photos Wound Measurements Length: (cm) 1.2 Width: (cm) 2 Depth: (cm) 0.3 Clustered Quantity: 2 Area: (cm) 1.885 Volume: (cm) 0.565 % Reduction in Area: -15.9% % Reduction in Volume: -246.6% Epithelialization: None Tunneling: No Undermining: Yes Starting Position (o'clock): 12 Ending Position (o'clock): 12 Maximum Distance: (cm) 0.2 Wound Description Classification: Unstageable/Unclassified Wound Margin: Flat and Intact Exudate Amount: Medium Exudate Type: Serosanguineous Exudate Color: red, brown Foul Odor After Cleansing: No Slough/Fibrino Yes Wound Bed Granulation Amount: Small (1-33%) Exposed Structure Granulation Quality: Red Fascia Exposed: No Necrotic Amount: Large (67-100%) Fat Layer (Subcutaneous Tissue) Exposed: Yes Necrotic Quality: Adherent Slough Tendon Exposed: No Muscle Exposed: No Joint Exposed: No Bone Exposed: No Electronic Signature(s) Signed: 04/24/2021 1:41:47 PM By: Sandre Kitty Signed: 04/28/2021 5:37:32 PM By: Rhae Hammock RN Previous Signature: 04/24/2021 12:00:56 PM Version By: Rhae Hammock RN Entered By: Sandre Kitty on 04/24/2021 13:04:02 -------------------------------------------------------------------------------- Vitals Details Patient Name: Date of Service: STRICKLA [name], [name] 04/23/2021 12:45 PM Medical Record Number: [id number] Patient Account Number: [id number] Date of Birth/Sex: Treating RN: May 04, 1971 (50 y.o. [name] [name], [name] [name] Primary Care Olin Gurski: [name] Other Clinician: Referring Willowdean Luhmann: Treating Rimas Gilham/Extender: Delano Metz in Treatment: 35 Vital Signs Time Taken: 12:46 Temperature (F): 98.1 Height (in): 77 Pulse (bpm): 98 Weight (lbs): 232 Respiratory Rate (breaths/min): 17 Body Mass Index (BMI): 27.5 Blood Pressure (mmHg): 124/74 Capillary Blood Glucose (mg/dl): 236 Reference Range: 80 - 120 mg / dl Notes patient reports no  food since yesterday. Blood sugar 55 today. Recheck in office 236. Patient states not feeling well today. Electronic Signature(s) Signed: 04/23/2021 5:52:00 PM By: Deon Pilling Entered By: Deon Pilling on 04/23/2021 12:56:52

## 2021-04-29 ENCOUNTER — Other Ambulatory Visit: Payer: Self-pay

## 2021-04-29 ENCOUNTER — Encounter: Payer: Self-pay | Admitting: Gastroenterology

## 2021-04-29 ENCOUNTER — Encounter (HOSPITAL_BASED_OUTPATIENT_CLINIC_OR_DEPARTMENT_OTHER): Payer: Medicare Other | Admitting: Internal Medicine

## 2021-04-29 ENCOUNTER — Ambulatory Visit (INDEPENDENT_AMBULATORY_CARE_PROVIDER_SITE_OTHER): Payer: Medicare Other | Admitting: Gastroenterology

## 2021-04-29 VITALS — BP 120/74 | HR 88 | Ht 76.0 in | Wt 229.2 lb

## 2021-04-29 DIAGNOSIS — L97522 Non-pressure chronic ulcer of other part of left foot with fat layer exposed: Secondary | ICD-10-CM | POA: Diagnosis not present

## 2021-04-29 DIAGNOSIS — Z862 Personal history of diseases of the blood and blood-forming organs and certain disorders involving the immune mechanism: Secondary | ICD-10-CM

## 2021-04-29 DIAGNOSIS — S81801A Unspecified open wound, right lower leg, initial encounter: Secondary | ICD-10-CM

## 2021-04-29 DIAGNOSIS — N189 Chronic kidney disease, unspecified: Secondary | ICD-10-CM

## 2021-04-29 DIAGNOSIS — R112 Nausea with vomiting, unspecified: Secondary | ICD-10-CM

## 2021-04-29 DIAGNOSIS — K920 Hematemesis: Secondary | ICD-10-CM

## 2021-04-29 NOTE — Patient Instructions (Signed)
If you are age 48 or older, your body mass index should be between 23-30. Your Body mass index is 27.91 kg/m. If this is out of the aforementioned range listed, please consider follow up with your Primary Care Provider.  If you are age 7 or younger, your body mass index should be between 19-25. Your Body mass index is 27.91 kg/m. If this is out of the aformentioned range listed, please consider follow up with your Primary Care Provider.    You have been scheduled for an endoscopy. Please follow written instructions given to you at your visit today. If you use inhalers (even only as needed), please bring them with you on the day of your procedure.   Due to recent changes in healthcare laws, you may see the results of your imaging and laboratory studies on MyChart before your provider has had a chance to review them.  We understand that in some cases there may be results that are confusing or concerning to you. Not all laboratory results come back in the same time frame and the provider may be waiting for multiple results in order to interpret others.  Please give [place] 48 hours in order for your provider to thoroughly review all the results before contacting the office for clarification of your results.   __________________________________________________________  The [facility] providers would like to encourage you to use [facility] to communicate with providers for non-urgent requests or questions.  Due to long hold times on the telephone, sending your provider a message by [facility] may be a faster and more efficient way to get a response.  Please allow 48 business hours for a response.  Please remember that this is for non-urgent requests.   You have been scheduled for a gastric emptying scan at [facility] on 05-12-2021 at 730am. Please arrive at least 15 minutes prior to your appointment for registration. Please make certain not to have anything to eat or drink after midnight the night before  your test. Hold all stomach medications (ex: Zofran, phenergan, Reglan) 48 hours prior to your test. If you need to reschedule your appointment, please contact radiology scheduling at [phone]. _____________________________________________________________________ A gastric-emptying study measures how long it takes for food to move through your stomach. There are several ways to measure stomach emptying. In the most common test, you eat food that contains a small amount of radioactive material. A scanner that detects the movement of the radioactive material is placed over your abdomen to monitor the rate at which food leaves your stomach. This test normally takes about 4 hours to complete. _____________________________________________________________________  It was a pleasure to see you today!  Thank you for trusting me with your gastrointestinal care!

## 2021-04-29 NOTE — Progress Notes (Addendum)
[facility] Progress Note  Chief Complaint: Hematemesis and vomiting  Subjective  History:  [name] [name] is known to me from previous colonoscopy evaluations for heme positive stool. ED visit May 18 for right lower extremity cellulitis (posterior stump) ED visit June 1 for 24 hours vomiting leading to hematemesis. June 2020 colonoscopy for heme positive stool, poor preparation, multiple adenomas removed. Repeat colonoscopy July 2021 prep initially fair, then improved with lavage.  3 diminutive adenomas removed, redundant colon, left-sided diverticulosis.  With a June 1 ED visit, he had been sick for about 3 days with protracted vomiting and finally hematemesis.  He reports at least a year of intermittent vomiting of either food or bile, typically occurring about once a week.  This recent episode was the worst he had had, and he thinks he may have seen some blood on previous occasions but not as much as this time. He denies dysphagia or odynophagia.  Appetite generally good and weight stable as near as he knows.  He finds it difficult to control his glucose checks his glucose once daily at supper. No aspirin or NSAID use. ROS: Cardiovascular:  no chest pain Respiratory: no dyspnea Depressed mood Remainder of systems negative except as above The patient's Past Medical, Family and Social History were reviewed and are on file in the EMR. Past Medical History:  Diagnosis Date   Allergy    seasonal and environmental   ARF (acute renal failure) ([place]) 09/2017   Chronic constipation 05/13/2020   Depression    Diabetes mellitus without complication ([place]) 8032   Hyperlipidemia    Hypertension    Necrotizing fasciitis ([place]) 10/13/2017   Neuromuscular disorder ([place])    neuropathy   Wound dehiscence 11/24/2017   Past Surgical History:  Procedure Laterality Date   AMPUTATION Right 10/13/2017   Procedure: RIGHT BELOW KNEE AMPUTATION;  Surgeon: [name], MD;  Location: [place];   Service: Orthopedics;  Laterality: Right;   AMPUTATION Right 11/24/2017   Procedure: AMPUTATION BELOW KNEE REVISION;  Surgeon: [name], MD;  Location: [facility];  Service: Orthopedics;  Laterality: Right;   COLONOSCOPY  05/11/2019   NO PAST SURGERIES     POLYPECTOMY     WISDOM TOOTH EXTRACTION      Objective:  Med list reviewed  Current Outpatient Medications:    AGAMATRIX ULTRA-THIN LANCETS MISC, Check blood sugars before meals twice daily, Disp: 100 each, Rfl: 11   amLODipine (NORVASC) 10 MG tablet, Take 10 mg by mouth every morning. , Disp: , Rfl:    ARIPiprazole (ABILIFY) 20 MG tablet, Take 20 mg by mouth every morning., Disp: , Rfl:    atorvastatin (LIPITOR) 20 MG tablet, Take 20 mg by mouth every morning., Disp: , Rfl:    baclofen (LIORESAL) 10 MG tablet, 1/2 tab twice a day (Patient taking differently: Take 20 mg by mouth 2 (two) times daily.), Disp: 30 each, Rfl: 6   Blood Glucose Monitoring Suppl (AGAMATRIX PRESTO) w/Device KIT, Check sugars twice daily before meals, Disp: 1 kit, Rfl: 0   dapagliflozin propanediol (FARXIGA) 10 MG TABS tablet, Take 10 mg by mouth daily., Disp: , Rfl:    desvenlafaxine (PRISTIQ) 100 MG 24 hr tablet, Take 100 mg by mouth daily., Disp: , Rfl:    Dulaglutide (TRULICITY) 1.22 QM/2.5OI SOPN, Inject 0.75 mg into the skin once a week. (Patient taking differently: Inject 0.75 mg into the skin once a week. Friday), Disp: 6 mL, Rfl: 3   DULoxetine (CYMBALTA) 60  MG capsule, Take 60 mg by mouth 2 (two) times daily. , Disp: , Rfl:    FLUoxetine (PROZAC) 40 MG capsule, Take 40 mg by mouth every morning., Disp: , Rfl:    fluticasone (FLONASE) 50 MCG/ACT nasal spray, Place 2 sprays into both nostrils daily as needed for allergies or rhinitis. , Disp: , Rfl:    gabapentin (NEURONTIN) 300 MG capsule, 1 cap by mouth in morning and midday, 2 caps by mouth at bedtime (Patient taking differently: Take 300-600 mg by mouth See admin instructions. Take one capsule (300 mg)  by mouth in the morning and afternoon, take two capsules (600 mg) at bedtime), Disp: 120 capsule, Rfl: 11   glipiZIDE (GLUCOTROL) 5 MG tablet, 1/2 tab by mouth twice daily with meal (Patient taking differently: Take 5 mg by mouth daily. With dinner meal), Disp: 15 tablet, Rfl: 11   glucose blood (AGAMATRIX PRESTO TEST) test strip, Check blood sugars twice daily before meals, Disp: 100 each, Rfl: 12   lamoTRIgine (LAMICTAL) 100 MG tablet, Take 100 mg by mouth 2 (two) times daily., Disp: , Rfl:    lisinopril (ZESTRIL) 20 MG tablet, Take 1 tablet (20 mg total) by mouth daily., Disp: , Rfl:    metoprolol tartrate (LOPRESSOR) 25 MG tablet, Take 25 mg by mouth 2 (two) times daily., Disp: , Rfl:    ondansetron (ZOFRAN) 8 MG tablet, Take 8 mg by mouth every 8 (eight) hours as needed for nausea or vomiting., Disp: , Rfl:    pantoprazole (PROTONIX) 40 MG tablet, Take 1 tablet (40 mg total) by mouth 2 (two) times daily., Disp: 60 tablet, Rfl: 2   metFORMIN (GLUCOPHAGE) 1000 MG tablet, Take 1 tablet by mouth 2 (two) times daily., Disp: , Rfl:   Current Facility-Administered Medications:    0.9 %  sodium chloride infusion, 500 mL, Intravenous, Once, Danis, Estill Cotta III, MD   Vital signs in last 24 hrs: Vitals:   04/29/21 1455  BP: 120/74  Pulse: 88   Wt Readings from Last 3 Encounters:  04/29/21 229 lb 4 oz (104 kg)  04/09/21 230 lb (104.3 kg)  03/21/21 223 lb 9.6 oz (101.4 kg)    Physical Exam  Depressed affect.  Ambulatory on RLE prosthesis.  Gets on exam table independently. HEENT: sclera anicteric, oral mucosa moist without lesions Neck: supple, no thyromegaly, JVD or lymphadenopathy Cardiac: RRR without murmurs, S1S2 heard, Pulm: clear to auscultation bilaterally, normal RR and effort noted Abdomen: soft, no tenderness, with active bowel sounds. No guarding or palpable hepatosplenomegaly. Skin; severely dry skin Right [facility]KA Labs:  Hemoglobin A1c 8.1 on May 19  CBC Latest Ref Rng & Units  04/23/2021 04/10/2021 04/09/2021  WBC 4.0 - 10.5 K/uL 17.4(H) 13.8(H) 15.4(H)  Hemoglobin 13.0 - 17.0 g/dL 10.5(L) 9.5(L) 9.4(L)  Hematocrit 39.0 - 52.0 % 33.4(L) 30.5(L) 31.2(L)  Platelets 150 - 400 K/uL 296 200 198   CMP Latest Ref Rng & Units 04/23/2021 04/12/2021 04/11/2021  Glucose 70 - 99 mg/dL 254(H) 131(H) 129(H)  BUN 6 - 20 mg/dL 40(H) 20 24(H)  Creatinine 0.61 - 1.24 mg/dL 2.25(H) 1.83(H) 1.92(H)  Sodium 135 - 145 mmol/L 144 139 137  Potassium 3.5 - 5.1 mmol/L 4.2 3.7 3.9  Chloride 98 - 111 mmol/L 105 106 108  CO2 22 - 32 mmol/L _0 Calcium 8.9 - 10.3 mg/dL 8.8(L) 8.4(L) 8.4(L)  Total Protein 6.5 - 8.1 g/dL 7.3 - -  Total Bilirubin 0.3 - 1.2 mg/dL 0.6 - -  Alkaline Phos 38 - 126 U/L 134(H) - -  AST 15 - 41 U/L 15 - -  ALT 0 - 44 U/L 12 - -    ___________________________________________ Radiologic studies:  Last CT abdomen and pelvis September 2020 with "minimal cholelithiasis" ____________________________________________ Other:   _____________________________________________ Assessment & Plan  Assessment: Encounter Diagnoses  Name Primary?   Nausea and vomiting in adult Yes   Hematemesis with nausea    History of anemia due to chronic kidney disease    About a year of intermittent vomiting, recent more severe episode with hematemesis.  Concern for underlying gastroparesis and recent hematemesis possibly Mallory-Weiss tear.  Consider obstructing cause, ulcer or malignancy but less likely. PPI was started after recent ED visit.  Plan: Continue PPI at current dose Gastric emptying study Upper endoscopy.  I would like this done after the gastric emptying study to see if he needs to be on metoclopramide first.  This would decrease the risk of aspiration. He was agreeable after discussion of procedure and risks  The benefits and risks of the planned procedure were described in detail with the patient or (when appropriate) their health care proxy.  Risks were outlined  as including, but not limited to, bleeding, infection, perforation, adverse medication reaction leading to cardiac or pulmonary decompensation, pancreatitis (if ERCP).  The limitation of incomplete mucosal visualization was also discussed.  No guarantees or warranties were given.  Patient at increased risk for cardiopulmonary complications of procedure due to medical comorbidities.  40 minutes were spent on this encounter (including chart review, history/exam, counseling/coordination of care, and documentation) > 50% of that time was spent on counseling and coordination of care.  Topics discussed included: See above.  Nelida Meuse III

## 2021-04-29 NOTE — Progress Notes (Signed)
STACY, DESHLER ([id number]) Visit Report for 04/29/2021 Chief Complaint Document Details Patient Name: Date of Service: [name] [name] 04/29/2021 12:45 PM Medical Record Number: [id number] Patient Account Number: [id number] Date of Birth/Sex: Treating RN: [birth year] (50 y.o. [name], [name] Primary Care Provider: [name] Other Clinician: Referring Provider: Treating Provider/Extender: Delano Metz in Treatment: 68 Information Obtained from: Patient Chief Complaint patient is here for a review of the wound on his plantar left first metatarsal head and back of his right leg Electronic Signature(s) Signed: 04/29/2021 1:26:57 PM By: Kalman Shan DO Entered By: Kalman Shan on 04/29/2021 13:19:33 -------------------------------------------------------------------------------- Debridement Details Patient Name: Date of Service: [name] [name], [name] 04/29/2021 12:45 PM Medical Record Number: [id number] Patient Account Number: [id number] Date of Birth/Sex: Treating RN: [birth year] (50 y.o. [name] Primary Care Provider: [name] Other Clinician: Referring Provider: Treating Provider/Extender: Delano Metz in Treatment: 36 Debridement Performed for Assessment: Wound #2R Left Metatarsal head first Performed By: Physician Kalman Shan, DO Debridement Type: Debridement Severity of Tissue Pre Debridement: Fat layer exposed Level of Consciousness (Pre-procedure): Awake and Alert Pre-procedure Verification/Time Out Yes - 13:00 Taken: Start Time: 13:01 T Area Debrided (L x W): otal 0.9 (cm) x 0.5 (cm) = 0.45 (cm) Tissue and other material debrided: Non-Viable, Subcutaneous Level: Skin/Subcutaneous Tissue Debridement Description: Excisional Instrument: Curette Bleeding: Minimum Hemostasis Achieved: Pressure End Time: 13:04 Response to Treatment: Procedure was tolerated well Level of  Consciousness (Post- Awake and Alert procedure): Post Debridement Measurements of Total Wound Length: (cm) 0.9 Width: (cm) 0.5 Depth: (cm) 0.1 Volume: (cm) 0.035 Character of Wound/Ulcer Post Debridement: Stable Severity of Tissue Post Debridement: Fat layer exposed Post Procedure Diagnosis Same as Pre-procedure Electronic Signature(s) Signed: 04/29/2021 1:12:18 PM By: Lorrin Jackson Signed: 04/29/2021 1:26:57 PM By: Kalman Shan DO Entered By: Lorrin Jackson on 04/29/2021 13:12:17 -------------------------------------------------------------------------------- Debridement Details Patient Name: Date of Service: [name] [name], [name] 04/29/2021 12:45 PM Medical Record Number: [id number] Patient Account Number: [id number] Date of Birth/Sex: Treating RN: [birth year] (50 y.o. [name] Primary Care Provider: [name] Other Clinician: Referring Provider: Treating Provider/Extender: Delano Metz in Treatment: 36 Debridement Performed for Assessment: Wound #3 Right,Posterior Knee Performed By: Physician Kalman Shan, DO Debridement Type: Debridement Level of Consciousness (Pre-procedure): Awake and Alert Pre-procedure Verification/Time Out Yes - 13:00 Taken: Start Time: 13:04 T Area Debrided (L x W): otal 0.6 (cm) x 1 (cm) = 0.6 (cm) Tissue and other material debrided: Non-Viable, Subcutaneous Level: Skin/Subcutaneous Tissue Debridement Description: Excisional Instrument: Curette Bleeding: Minimum Hemostasis Achieved: Pressure End Time: 13:07 Response to Treatment: Procedure was tolerated well Level of Consciousness (Post- Awake and Alert procedure): Post Debridement Measurements of Total Wound Length: (cm) 1.2 Stage: Unstageable/Unclassified Width: (cm) 2 Depth: (cm) 0.2 Volume: (cm) 0.377 Character of Wound/Ulcer Post Debridement: Stable Post Procedure Diagnosis Same as Pre-procedure Electronic Signature(s) Signed:  04/29/2021 1:13:28 PM By: Lorrin Jackson Signed: 04/29/2021 1:26:57 PM By: Kalman Shan DO Entered By: Lorrin Jackson on 04/29/2021 13:13:28 -------------------------------------------------------------------------------- HPI Details Patient Name: Date of Service: [name] [name], [name] 04/29/2021 12:45 PM Medical Record Number: [id number] Patient Account Number: [id number] Date of Birth/Sex: Treating RN: Aug 05, 1971 (50 y.o. [name] Primary Care Provider: [name] Other Clinician: Referring Provider: Treating Provider/Extender: Delano Metz in Treatment: 27 History of Present Illness HPI Description: 04/21/18 ADMISSION This is a 50 year old man who is a type II diabetic. In spite of fact his hemoglobin A1c  is actually quite good 5.73 months ago he is at a really difficult time over the last 6-7 months. He developed a rapidly progressive infection in the right foot in November 2018 associated with osteomyelitis and necrotizing fasciitis. 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 The stump required and a revision on 11/24/17. The stump revision was advertised as being secondary to falls. I'm not sure the progressive history here however this area is actually closed over. The patient tells me over the same timeframe he has had wounds on the plantar aspect of his left foot. In the ED saw Dr. Sharol Given in April of this year. 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 It is really not clear that this patient is been dressing this with anything. He came in with the clinic without any specific dressing on the wound areas using his own tennis shoe. He tells me that he only ambulates of course to do a pivot transfer. He does not have his prosthesis for the right leg as of yet and he blames Medicaid for this. He does however use the foot to push himself along in his wheelchair at home. The patient has not had formal  arterial studies. ABI in our clinic on the left was 1.13. Patient's past medical history includes type 2 diabetes, fracture of the right fibula. I see that he was treated for abscesses on his right buttock and chest in 2016. I did not look at the microbiology of this. 04/28/18; patient comes back in the clinic today with the wound pretty much the same as when he came in here last week. Small opening lots of undermining relatively. He tells [place] that nothing is really been on this for 3 days in spite of the fact that we gave him enough to dress this easily especially such a small wound. He says he lives with his mother, she is not capable of assisting with this he is changing the dressings himself. 05/05/18; much better-looking wound today. Smaller. There is some undermining medially however that's only perhaps 2 mm. No surrounding erythema. We've been using silver collagen 05/12/18; small wound on the first metatarsal head. No undermining no surrounding erythema. We've been using silver collagen he has home health coming out to change 05/20/18 the wound on the first metatarsal head looks better. Covered in surface debris/callus nonviable tissue. Required debridement but post debridement this looks quite good. We've been using silver collagen. He has home health 05/27/18; first metatarsal head wound continues to improve. Just about completely closed. Still a lot of surface debridement callus. We've been using silver collagen 06/06/18; the first metatarsal wound is completely healed over. There is still a lot of callus. I gently removed some of this just to make sure that there was no open area and there is not. The patient mentions to me that his left leg has felt like "lead" for about a week READMISSION 08/16/2020 This is a 50 year old man with type 2 diabetes. He returns to clinic today with a 1 month history of a blister and callus over the first metatarsal head. This is in exactly the same area as when he  was here in 2019. 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 He has standard running shoes on the left foot to match the area in his prosthesis. He has only been covering this area with a Band-Aid has not really been  specifically dressing this. ABI in our clinic on the left was 1.16. This is essentially stable from the value in 2019 10/1 the patient has a linear wound over the left first metatarsal head. Again not a lot of callus thick skin around this that I removed with a #3 curette. This is not go deep to bone or muscle it does not look to be infected. 10/8 first metatarsal head. The wound looks like it is closing however this is going to be a difficult area to offload in the future. He has a size 15 shoe and he says his shoes are years old. The inserts in these current shoes are totally useless and I have advised him to replace these. He may need a new pair of shoes and I have he got original prosthesis at [employer] [place] I have asked him to go back there. 11/9; the patient has not been here in more than a month. Not sure he was healed when he was here the last time. I told him he would have to get new shoes and certainly offload the area over the left first metatarsal head. He has done nothing of the kind. He arrives back in clinic with the same old new balance sneakers. A very large separating callus over the first metatarsal head on the left 11/16; he has purchased new shoes and has at least 2 insoles like I asked. The wound is measuring much smaller. He is using silver alginate he changes the dressing himself 11/30; wound is measurably smaller in length of about a half a centimeter. This is generally very little depth. We have been using silver alginate. He changes the dressing himself every second day. 12/14; wound is about the same size. Significant undermining medially relative to the size of the wound. We have been using silver alginate.  There is not a way to offload this area as he walks with a prosthesis on the right leg 12/28; wound is about the same size. Again he has undermining medially. I am using polymen on this 12/02/2020; the wound looks smaller. Still a senescent edge. We have been using polymen 1/24; the wound is come down a few millimeters. Still a senescent edge. I have been using polymen 2/8; the wound is come down slightly. Still with slight undermining from 12-6 o'clock I have been using polymen partially to get offloading on this area which is opposed by his prosthesis on the other leg. 2/22 quite open improvement he still has a comma shaped wound on the left first metatarsal head. Some depth. Using polymen 3/14; he still has the same problem of a comma shaped area over the left first metatarsal head. This seems to have had more depth today at 0.6 cm. We have been using polymen. The patient changes this himself every second day. I explored the idea of a total contact cast on the left leg however this is his driving foot. He was not enamored with the idea of not being able to drive and states that he does not have anybody else to get him around 3/28; again he comes in with a smaller looking wound however there is depth and undermining requiring debridement. We have been using Hydrofera Blue, changed to silver collagen today. The patient is doing the dressing himself 4/11; patient presents today for follow-up of his left diabetic foot wound. He denies any issues in the past 2 weeks. He is currently using silver collagen every other day with dressing changes. He does  not use diabetic shoes or special inserts to his sneakers. He does not use felt pads for offloading. 4/19; patient presents for his 1 week follow-up. He denies any issues. He reports using Hydrofera Blue and has not been using silver collagen for dressing changes. He reports minimal drainage to the wound. He overall feels well. 5/3; patient presents for  2-week follow-up. He has been using silver collagen without issues. He has no complaints today and states he overall feels well. 5/17; patient presents for 2-week follow-up. He states that the sleeve is rubbing up against the back of his right leg and causing a wound. He states this happened a week ago and has not been dressing the wound. The plantar wound is stable and he has been using collagen every other day. 5/24; Patient was had a recent hospitalization for cellulitis of his right popliteal fossa. He was admitted and started on IV antibiotics. He does not recall the plantar wound dressed while inpatient. He feels a lot better today. He was discharged on 5/21 on oral antibiotics and has not picked this up until today. He reports using collagen to the plantar wound since discharge. He is keeping the right posterior knee wound covered. He is getting a new sleeve for his prostatic on 5/26. He currently denies signs of infection. 6/1; patient presents for 1 week follow-up. He is still on doxycycline. He has not been dressing the back of the right knee wound. He states he is receiving his new prosthetic sleeve tomorrow. He states he is using silver alginate to the plantar foot wound. He currently denies signs of infection. He states he has not been offloading the left foot wound 6/7; patient presents for 1 week follow-up. He completes his last dose of doxycycline today. He finally received his prosthetic sleeve for the right side and he states it feels much better. He has been using antibiotic ointment to the back of that right knee wound. He has been using silver alginate to the plantar foot wound. He currently denies signs of infection. Electronic Signature(s) Signed: 04/29/2021 1:26:57 PM By: Kalman Shan DO Entered By: Kalman Shan on 04/29/2021 13:20:27 -------------------------------------------------------------------------------- Physical Exam Details Patient Name: Date of  Service: [name] [name], [name] 04/29/2021 12:45 PM Medical Record Number: [id number] Patient Account Number: [id number] Date of Birth/Sex: Treating RN: January 15, 1971 (50 y.o. [name] Primary Care Provider: [name] Other Clinician: Referring Provider: Treating Provider/Extender: Delano Metz in Treatment: 24 Constitutional respirations regular, non-labored and within target range for patient.Marland Kitchen Psychiatric pleasant and cooperative. Notes Left first met head: Wound bed with granulation tissue and nonviable tissue present. Some callus and maceration circumferentially although greatly improved from last clinic visit. No signs of infection. Posterior right knee: 2 wounds with skin breakdown with no signs of infection. Some fibrinous tissue present along with granulation tissue to the wound beds. Electronic Signature(s) Signed: 04/29/2021 1:26:57 PM By: Kalman Shan DO Entered By: Kalman Shan on 04/29/2021 13:21:50 -------------------------------------------------------------------------------- Physician Orders Details Patient Name: Date of Service: [name] [name], [name] 04/29/2021 12:45 PM Medical Record Number: [id number] Patient Account Number: [id number] Date of Birth/Sex: Treating RN: 1971-03-03 (50 y.o. 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 - Dr. He[place]  Tuesday Return A Bathing/ Shower/ Hygiene May shower and wash wound with soap and water. - WITH DRESSING CHANGES. Edema Control - Lymphedema / SCD / Other Elevate legs to the level of the heart or above for 30 minutes daily and/or when sitting, a frequency of: Avoid standing for long periods of time. Off-Loading Other: - use felt for offloading Wound Treatment Wound #2R - Metatarsal head first Wound Laterality: Left Cleanser: Soap and Water 1 x Per Day/15 Days Discharge Instructions: May shower and wash wound with dial antibacterial soap and water prior to dressing change. Cleanser: Wound Cleanser (Generic) 1 x Per Day/15 Days Discharge Instructions: Cleanse the wound with wound cleanser prior to applying a clean dressing using gauze sponges, not tissue or cotton balls. Peri-Wound Care: Sween Lotion (Moisturizing lotion) 1 x Per Day/15 Days Discharge Instructions: Apply moisturizing lotion as directed Prim Dressing: KerraCel Ag Gelling Fiber Dressing, 4x5 in (silver alginate) 1 x Per Day/15 Days ary Discharge Instructions: Apply silver alginate to wound bed as instructed Secondary Dressing: Woven Gauze Sponges 2x2 in (Generic) 1 x Per Day/15 Days Discharge Instructions: Apply over primary dressing as directed. Secondary Dressing: Optifoam Non-Adhesive Dressing, 4x4 in (Generic) 1 x Per Day/15 Days Discharge Instructions: Apply over primary dressing cut to make foam donut to help offload Secured With: Conforming Stretch Gauze Bandage, Sterile 2x75 (in/in) (Generic) 1 x Per Day/15 Days Discharge Instructions: Secure with stretch gauze as directed. Secured With: 8M Medipore H Soft Cloth Surgical Tape, 2x2 (in/yd) (Generic) 1 x Per Day/15 Days Discharge Instructions: Secure dressing with tape as directed. Wound #3 - Knee Wound Laterality: Right, Posterior Cleanser: Soap and Water 1 x Per GGE/36 Days Discharge Instructions: May shower and wash wound with dial antibacterial soap and water  prior to dressing change. Cleanser: Wound Cleanser (Generic) 1 x Per Day/15 Days Discharge Instructions: Cleanse the wound with wound cleanser prior to applying a clean dressing using gauze sponges, not tissue or cotton balls. Topical: Triple Antibiotic Ointment, 1 (oz) Tube 1 x Per Day/15 Days Secondary Dressing: Zetuvit Plus Silicone Border Dressing 4x4 (in/in) 1 x Per Day/15 Days Discharge Instructions: Apply silicone border over primary dressing as directed. Electronic Signature(s) Signed: 04/29/2021 1:26:57 PM By: Kalman Shan DO Previous Signature: 04/29/2021 1:14:23 PM Version By: Lorrin Jackson Entered By: Kalman Shan on 04/29/2021 13:25:32 -------------------------------------------------------------------------------- Problem List Details Patient Name: Date of Service: [name] [name], [name] 04/29/2021 12:45 PM Medical Record Number: [id number] Patient Account Number: [id number] Date of Birth/Sex: Treating RN: 05-19-1971 (51 y.o. [name], [name] Primary Care Provider: [name] Other Clinician: Referring Provider: Treating Provider/Extender: Delano Metz in Treatment: 8 Active Problems ICD-10 Encounter Code Description Active Date MDM Diagnosis E11.621 Type 2 diabetes mellitus with foot ulcer 08/16/2020 No Yes S81.801D Unspecified open wound, right lower leg, subsequent encounter 04/08/2021 No Yes L97.522 Non-pressure chronic ulcer of other part of left foot with fat layer exposed 08/16/2020 No Yes E11.42 Type 2 diabetes mellitus with diabetic polyneuropathy 08/16/2020 No Yes Z89.511 Acquired absence of right leg below knee 08/16/2020 No Yes Inactive Problems Resolved Problems Electronic Signature(s) Signed: 04/29/2021 1:26:57 PM By: Kalman Shan DO Entered By: Kalman Shan on 04/29/2021 13:18:56 -------------------------------------------------------------------------------- Progress Note Details Patient Name: Date of  Service: [name] [name], [name] 04/29/2021 12:45 PM Medical Record Number:  [id number] Patient Account Number: [id number] Date of Birth/Sex: Treating RN: December 17, 1970 (50 y.o. [name], [name] Primary Care Provider: [name] Other Clinician: Referring Provider: Treating Provider/Extender: Delano Metz in Treatment: 65 Subjective Chief Complaint Information obtained from Patient patient is here for a review of the wound on his plantar left first metatarsal head and back of his right leg History of Present Illness (HPI) 04/21/18 ADMISSION This is a 50 year old man who is a type II diabetic. In spite of fact his hemoglobin A1c is actually quite good 5.73 months ago he is at a really difficult time over the last 6-7 months. He developed a rapidly progressive infection in the right foot in November 2018 associated with osteomyelitis and necrotizing fasciitis. 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 The stump required and a revision on 11/24/17. The stump revision was advertised as being secondary to falls. I'm not sure the progressive history here however this area is actually closed over. The patient tells me over the same timeframe he has had wounds on the plantar aspect of his left foot. In the ED saw Dr. Sharol Given in April of this year. 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 It is really not clear that this patient is been dressing this with anything. He came in with the clinic without any specific dressing on the wound areas using his own tennis shoe. He tells me that he only ambulates of course to do a pivot transfer. He does not have his prosthesis for the right leg as of yet and he blames Medicaid for this. He does however use the foot to push himself along in his wheelchair at home. The patient has not had formal arterial studies. ABI in our clinic on the left was 1.13. Patient's past medical history includes type 2  diabetes, fracture of the right fibula. I see that he was treated for abscesses on his right buttock and chest in 2016. I did not look at the microbiology of this. 04/28/18; patient comes back in the clinic today with the wound pretty much the same as when he came in here last week. Small opening lots of undermining relatively. He tells [place] that nothing is really been on this for 3 days in spite of the fact that we gave him enough to dress this easily especially such a small wound. He says he lives with his mother, she is not capable of assisting with this he is changing the dressings himself. 05/05/18; much better-looking wound today. Smaller. There is some undermining medially however that's only perhaps 2 mm. No surrounding erythema. We've been using silver collagen 05/12/18; small wound on the first metatarsal head. No undermining no surrounding erythema. We've been using silver collagen he has home health coming out to change 05/20/18 the wound on the first metatarsal head looks better. Covered in surface debris/callus nonviable tissue. Required debridement but post debridement this looks quite good. We've been using silver collagen. He has home health 05/27/18; first metatarsal head wound continues to improve. Just about completely closed. Still a lot of surface debridement callus. We've been using silver collagen 06/06/18; the first metatarsal wound is completely healed over. There is still a lot of callus. I gently removed some of this just to make sure that there was no open area and there is not. The patient mentions to me that his left leg has felt like "lead" for about a week READMISSION 08/16/2020 This is a  50 year old man with type 2 diabetes. He returns to clinic today with a 1 month history of a blister and callus over the first metatarsal head. This is in exactly the same area as when he was here in 2019. He has a right BKA and a prosthesis from a diabetic foot infection on the right in  2018. He has standard running shoes on the left foot to match the area in his prosthesis. He has only been covering this area with a Band-Aid has not really been specifically dressing this. ABI in our clinic on the left was 1.16. This is essentially stable from the value in 2019 10/1 the patient has a linear wound over the left first metatarsal head. Again not a lot of callus thick skin around this that I removed with a #3 curette. This is not go deep to bone or muscle it does not look to be infected. 10/8 first metatarsal head. The wound looks like it is closing however this is going to be a difficult area to offload in the future. He has a size 15 shoe and he says his shoes are years old. The inserts in these current shoes are totally useless and I have advised him to replace these. He may need a new pair of shoes and I have he got original prosthesis at [employer] [place] I have asked him to go back there. 11/9; the patient has not been here in more than a month. Not sure he was healed when he was here the last time. I told him he would have to get new shoes and certainly offload the area over the left first metatarsal head. He has done nothing of the kind. He arrives back in clinic with the same old new balance sneakers. A very large separating callus over the first metatarsal head on the left 11/16; he has purchased new shoes and has at least 2 insoles like I asked. The wound is measuring much smaller. He is using silver alginate he changes the dressing himself 11/30; wound is measurably smaller in length of about a half a centimeter. This is generally very little depth. We have been using silver alginate. He changes the dressing himself every second day. 12/14; wound is about the same size. Significant undermining medially relative to the size of the wound. We have been using silver alginate. There is not a way to offload this area as he walks with a prosthesis on the right leg 12/28; wound  is about the same size. Again he has undermining medially. I am using polymen on this 12/02/2020; the wound looks smaller. Still a senescent edge. We have been using polymen 1/24; the wound is come down a few millimeters. Still a senescent edge. I have been using polymen 2/8; the wound is come down slightly. Still with slight undermining from 12-6 o'clock I have been using polymen partially to get offloading on this area which is opposed by his prosthesis on the other leg. 2/22 quite open improvement he still has a comma shaped wound on the left first metatarsal head. Some depth. Using polymen 3/14; he still has the same problem of a comma shaped area over the left first metatarsal head. This seems to have had more depth today at 0.6 cm. We have been using polymen. The patient changes this himself every second day. I explored the idea of a total contact cast on the left leg however this is his driving foot. He was not enamored with the idea of  not being able to drive and states that he does not have anybody else to get him around 3/28; again he comes in with a smaller looking wound however there is depth and undermining requiring debridement. We have been using Hydrofera Blue, changed to silver collagen today. The patient is doing the dressing himself 4/11; patient presents today for follow-up of his left diabetic foot wound. He denies any issues in the past 2 weeks. He is currently using silver collagen every other day with dressing changes. He does not use diabetic shoes or special inserts to his sneakers. He does not use felt pads for offloading. 4/19; patient presents for his 1 week follow-up. He denies any issues. He reports using Hydrofera Blue and has not been using silver collagen for dressing changes. He reports minimal drainage to the wound. He overall feels well. 5/3; patient presents for 2-week follow-up. He has been using silver collagen without issues. He has no complaints today and  states he overall feels well. 5/17; patient presents for 2-week follow-up. He states that the sleeve is rubbing up against the back of his right leg and causing a wound. He states this happened a week ago and has not been dressing the wound. The plantar wound is stable and he has been using collagen every other day. 5/24; Patient was had a recent hospitalization for cellulitis of his right popliteal fossa. He was admitted and started on IV antibiotics. He does not recall the plantar wound dressed while inpatient. He feels a lot better today. He was discharged on 5/21 on oral antibiotics and has not picked this up until today. He reports using collagen to the plantar wound since discharge. He is keeping the right posterior knee wound covered. He is getting a new sleeve for his prostatic on 5/26. He currently denies signs of infection. 6/1; patient presents for 1 week follow-up. He is still on doxycycline. He has not been dressing the back of the right knee wound. He states he is receiving his new prosthetic sleeve tomorrow. He states he is using silver alginate to the plantar foot wound. He currently denies signs of infection. He states he has not been offloading the left foot wound 6/7; patient presents for 1 week follow-up. He completes his last dose of doxycycline today. He finally received his prosthetic sleeve for the right side and he states it feels much better. He has been using antibiotic ointment to the back of that right knee wound. He has been using silver alginate to the plantar foot wound. He currently denies signs of infection. Patient History Information obtained from Patient. Family History Diabetes - Maternal Grandparents,Mother, Heart Disease - Father, Hypertension - Father, No family history of Cancer, Hereditary Spherocytosis, Kidney Disease, Lung Disease, Seizures, Stroke, Thyroid Problems, Tuberculosis. Social History Current every day smoker, Marital Status - Single, Alcohol  Use - Never, Drug Use - No History, Caffeine Use - Never. Medical History Eyes Denies history of Cataracts, Glaucoma, Optic Neuritis Ear/Nose/Mouth/Throat Denies history of Chronic sinus problems/congestion, Middle ear problems Hematologic/Lymphatic Patient has history of Anemia Denies history of Hemophilia, Human Immunodeficiency Virus, Lymphedema, Sickle Cell Disease Respiratory Denies history of Aspiration, Asthma, Chronic Obstructive Pulmonary Disease (COPD), Pneumothorax, Sleep Apnea, Tuberculosis Cardiovascular Patient has history of Hypertension, Peripheral Venous Disease Denies history of Angina, Arrhythmia, Congestive Heart Failure, Coronary Artery Disease, Deep Vein Thrombosis, Hypotension, Myocardial Infarction, Peripheral Arterial Disease, Phlebitis, Vasculitis Gastrointestinal Denies history of Cirrhosis , Colitis, Crohnoos, Hepatitis A, Hepatitis B, Hepatitis C Endocrine Patient has history  of Type II Diabetes - oral meds Denies history of Type I Diabetes Genitourinary Denies history of End Stage Renal Disease Immunological Denies history of Lupus Erythematosus, Raynaudoos, Scleroderma Integumentary (Skin) Denies history of History of Burn Musculoskeletal Denies history of Gout, Rheumatoid Arthritis, Osteoarthritis, Osteomyelitis Neurologic Denies history of Dementia, Neuropathy, Quadriplegia, Paraplegia, Seizure Disorder Oncologic Denies history of Received Chemotherapy, Received Radiation Psychiatric Denies history of Anorexia/bulimia, Confinement Anxiety Hospitalization/Surgery History - fever 105, sepsis cellulitis right popliteal fossa 04/09/2021. Medical A Surgical History Notes [name] Constitutional Symptoms (General Health) falls , leukocytosis Respiratory During waking hours and catches himself not breathing, "gasp for air". Saw Dr Wynonia Lawman, he couldn't find a reason Objective Constitutional respirations regular, non-labored and within target range for  patient.. Vitals Time Taken: 12:44 PM, Height: 77 in, Weight: 232 lbs, BMI: 27.5, Temperature: 98.1 F, Pulse: 89 bpm, Respiratory Rate: 16 breaths/min, Blood Pressure: 127/77 mmHg, Capillary Blood Glucose: 86 mg/dl. General Notes: glucose per pt report Psychiatric pleasant and cooperative. General Notes: Left first met head: Wound bed with granulation tissue and nonviable tissue present. Some callus and maceration circumferentially although greatly improved from last clinic visit. No signs of infection. Posterior right knee: 2 wounds with skin breakdown with no signs of infection. Some fibrinous tissue present along with granulation tissue to the wound beds. Integumentary (Hair, Skin) Wound #2R status is Open. Original cause of wound was Blister. The date acquired was: 06/23/2020. The wound has been in treatment 36 weeks. The wound is located on the Left Metatarsal head first. The wound measures 0.9cm length x 0.5cm width x 0.1cm depth; 0.353cm^2 area and 0.035cm^3 volume. There is Fat Layer (Subcutaneous Tissue) exposed. There is no tunneling or undermining noted. There is a medium amount of serosanguineous drainage noted. The wound margin is thickened. There is large (67-100%) pink granulation within the wound bed. There is no necrotic tissue within the wound bed. Wound #3 status is Open. Original cause of wound was Pressure Injury. The date acquired was: 03/31/2021. The wound has been in treatment 3 weeks. The wound is located on the Right,Posterior Knee. The wound measures 1.2cm length x 2cm width x 0.2cm depth; 1.885cm^2 area and 0.377cm^3 volume. There is Fat Layer (Subcutaneous Tissue) exposed. There is no tunneling or undermining noted. There is a medium amount of serosanguineous drainage noted. The wound margin is flat and intact. There is small (1-33%) pink, pale granulation within the wound bed. There is a large (67-100%) amount of necrotic tissue within the wound bed including Adherent  Slough. 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 Nonviable tissue was debrided. Granulation tissue only present postdebridement. No signs of infection. We will continue with silver alginate. The back of the right knee wounds appear well-healing as well. I debrided The larger of the 2 wounds. No signs of infection. I Recommended continuing to use antibiotic ointment. Now that he has his new prosthetic sleeve I think this will ameliorate the issue that was causing the wound. I will see him back in 2 weeks. Procedures Wound #2R Pre-procedure diagnosis of Wound #2R is a Diabetic Wound/Ulcer of the Lower Extremity located on the Left Metatarsal head first .Severity of Tissue Pre Debridement is: Fat layer exposed. There was a Excisional Skin/Subcutaneous Tissue Debridement  with a total area of 0.45 sq cm performed by Kalman Shan, DO. With the following instrument(s): Curette to remove Non-Viable tissue/material. Material removed includes Subcutaneous Tissue. No specimens were taken. A time out was conducted at 13:00, prior to the start of the procedure. A Minimum amount of bleeding was controlled with Pressure. The procedure was tolerated well. Post Debridement Measurements: 0.9cm length x 0.5cm width x 0.1cm depth; 0.035cm^3 volume. Character of Wound/Ulcer Post Debridement is stable. Severity of Tissue Post Debridement is: Fat layer exposed. Post procedure Diagnosis Wound #2R: Same as Pre-Procedure Wound #3 Pre-procedure diagnosis of Wound #3 is a Pressure Ulcer located on the Right,Posterior Knee . There was a Excisional Skin/Subcutaneous Tissue Debridement with a total area of 0.6  sq cm performed by Kalman Shan, DO. With the following instrument(s): Curette to remove Non-Viable tissue/material. Material removed includes Subcutaneous Tissue. No specimens were taken. A time out was conducted at 13:00, prior to the start of the procedure. A Minimum amount of bleeding was controlled with Pressure. The procedure was tolerated well. Post Debridement Measurements: 1.2cm length x 2cm width x 0.2cm depth; 0.377cm^3 volume. Post debridement Stage noted as Unstageable/Unclassified. Character of Wound/Ulcer Post Debridement is stable. Post procedure Diagnosis Wound #3: Same as Pre-Procedure Plan Follow-up Appointments: Return Appointment in 2 weeks. - Dr. Heber [place] Tuesday Bathing/ Shower/ Hygiene: May shower and wash wound with soap and water. - WITH DRESSING CHANGES. Edema Control - Lymphedema / SCD / Other: Elevate legs to the level of the heart or above for 30 minutes daily and/or when sitting, a frequency of: Avoid standing for long periods of time. Off-Loading: Other: - use felt for offloading WOUND #2R: - Metatarsal head first Wound Laterality: Left Cleanser: Soap and Water 1 x Per Day/15 Days Discharge Instructions: May shower and wash wound with dial antibacterial soap and water prior to dressing change. Cleanser: Wound Cleanser (Generic) 1 x Per Day/15 Days Discharge Instructions: Cleanse the wound with wound cleanser prior to applying a clean dressing using gauze sponges, not tissue or cotton balls. Peri-Wound Care: Sween Lotion (Moisturizing lotion) 1 x Per Day/15 Days Discharge Instructions: Apply moisturizing lotion as directed Prim Dressing: KerraCel Ag Gelling Fiber Dressing, 4x5 in (silver alginate) 1 x Per Day/15 Days ary Discharge Instructions: Apply silver alginate to wound bed as instructed Secondary Dressing: Woven Gauze Sponges 2x2 in (Generic) 1 x Per Day/15 Days Discharge Instructions: Apply over primary dressing as directed. Secondary Dressing:  Optifoam Non-Adhesive Dressing, 4x4 in (Generic) 1 x Per Day/15 Days Discharge Instructions: Apply over primary dressing cut to make foam donut to help offload Secured With: Conforming Stretch Gauze Bandage, Sterile 2x75 (in/in) (Generic) 1 x Per Day/15 Days Discharge Instructions: Secure with stretch gauze as directed. Secured With: 27M Medipore H Soft Cloth Surgical T ape, 2x2 (in/yd) (Generic) 1 x Per Day/15 Days Discharge Instructions: Secure dressing with tape as directed. WOUND #3: - Knee Wound Laterality: Right, Posterior Cleanser: Soap and Water 1 x Per TGG/26 Days Discharge Instructions: May shower and wash wound with dial antibacterial soap and water prior to dressing change. Cleanser: Wound Cleanser (Generic) 1 x Per Day/15 Days Discharge Instructions: Cleanse the wound with wound cleanser prior to applying a clean dressing using gauze sponges, not tissue or cotton balls. Topical: Triple Antibiotic Ointment, 1 (oz) Tube 1 x Per Day/15 Days Secondary Dressing: Zetuvit Plus Silicone Border Dressing 4x4 (in/in) 1 x Per Day/15 Days Discharge Instructions: Apply silicone border over primary dressing as directed. 1. In office sharp debridement 2.  Silver alginate to the plantar foot wound 3. Antibiotic ointment to the back of the right knee wound 4. Follow-up in 2 weeks Electronic Signature(s) Signed: 04/29/2021 1:26:57 PM By: Kalman Shan DO Entered By: Kalman Shan on 04/29/2021 13:25:41 -------------------------------------------------------------------------------- HxROS Details Patient Name: Date of Service: [name] [name], [name] 04/29/2021 12:45 PM Medical Record Number: [id number] Patient Account Number: [id number] Date of Birth/Sex: Treating RN: 27-Jul-1971 (50 y.o. [name] Primary Care Provider: Other Clinician: [name] Referring Provider: Treating Provider/Extender: Delano Metz in Treatment: 36 Information Obtained  From Patient Constitutional Symptoms (General Health) Medical History: Past Medical History Notes: falls , leukocytosis Eyes Medical History: Negative for: Cataracts; Glaucoma; Optic Neuritis Ear/Nose/Mouth/Throat Medical History: Negative for: Chronic sinus problems/congestion; Middle ear problems Hematologic/Lymphatic Medical History: Positive for: Anemia Negative for: Hemophilia; Human Immunodeficiency Virus; Lymphedema; Sickle Cell Disease Respiratory Medical History: Negative for: Aspiration; Asthma; Chronic Obstructive Pulmonary Disease (COPD); Pneumothorax; Sleep Apnea; Tuberculosis Past Medical History Notes: During waking hours and catches himself not breathing, "gasp for air". Saw Dr Wynonia Lawman, he couldn't find a reason Cardiovascular Medical History: Positive for: Hypertension; Peripheral Venous Disease Negative for: Angina; Arrhythmia; Congestive Heart Failure; Coronary Artery Disease; Deep Vein Thrombosis; Hypotension; Myocardial Infarction; Peripheral Arterial Disease; Phlebitis; Vasculitis Gastrointestinal Medical History: Negative for: Cirrhosis ; Colitis; Crohns; Hepatitis A; Hepatitis B; Hepatitis C Endocrine Medical History: Positive for: Type II Diabetes - oral meds Negative for: Type I Diabetes Time with diabetes: 2014 Treated with: Oral agents Blood sugar tested every day: Yes Tested : Blood sugar testing results: Breakfast: 91 Genitourinary Medical History: Negative for: End Stage Renal Disease Immunological Medical History: Negative for: Lupus Erythematosus; Raynauds; Scleroderma Integumentary (Skin) Medical History: Negative for: History of Burn Musculoskeletal Medical History: Negative for: Gout; Rheumatoid Arthritis; Osteoarthritis; Osteomyelitis Neurologic Medical History: Negative for: Dementia; Neuropathy; Quadriplegia; Paraplegia; Seizure Disorder Oncologic Medical History: Negative for: Received Chemotherapy; Received  Radiation Psychiatric Medical History: Negative for: Anorexia/bulimia; Confinement Anxiety Immunizations Pneumococcal Vaccine: Received Pneumococcal Vaccination: No Implantable Devices No devices added Hospitalization / Surgery History Type of Hospitalization/Surgery fever 105, sepsis cellulitis right popliteal fossa 04/09/2021 Family and Social History Cancer: No; Diabetes: Yes - Maternal Grandparents,Mother; Heart Disease: Yes - Father; Hereditary Spherocytosis: No; Hypertension: Yes - Father; Kidney Disease: No; Lung Disease: No; Seizures: No; Stroke: No; Thyroid Problems: No; Tuberculosis: No; Current every day smoker; Marital Status - Single; Alcohol Use: Never; Drug Use: No History; Caffeine Use: Never; Financial Concerns: No; Food, Clothing or Shelter Needs: No; Support System Lacking: No; Transportation Concerns: No Electronic Signature(s) Signed: 04/29/2021 1:26:57 PM By: Kalman Shan DO Signed: 04/29/2021 5:27:47 PM By: Rhae Hammock RN Entered By: Kalman Shan on 04/29/2021 13:20:37 -------------------------------------------------------------------------------- SuperBill Details Patient Name: Date of Service: [name] [name], [name] 04/29/2021 Medical Record Number: [id number] Patient Account Number: [id number] Date of Birth/Sex: Treating RN: [birth year] (50 y.o. [name] Primary Care Provider: [name] Other Clinician: Referring Provider: Treating Provider/Extender: Delano Metz in Treatment: 36 Diagnosis Coding ICD-10 Codes Code Description E11.621 Type 2 diabetes mellitus with foot ulcer S81.801A Unspecified open wound, right lower leg, initial encounter L97.522 Non-pressure chronic ulcer of other part of left foot with fat layer exposed E11.42 Type 2 diabetes mellitus with diabetic polyneuropathy Z89.511 Acquired absence of right leg below knee Facility Procedures CPT4 Code: [id number] Description: 54982 - DEB SUBQ  TISSUE 20 SQ CM/< ICD-10 Diagnosis Description L97.522 Non-pressure chronic ulcer of other part of left foot with fat layer exposed S81.801A Unspecified open  wound, right lower leg, initial encounter Modifier: Quantity: 1 Physician Procedures : CPT4 Code Description Modifier [id number] - WC PHYS SUBQ TISS 20 SQ CM ICD-10 Diagnosis Description L97.522 Non-pressure chronic ulcer of other part of left foot with fat layer exposed S81.801A Unspecified open wound, right lower leg, initial  encounter Quantity: 1 Electronic Signature(s) Signed: 04/29/2021 1:26:57 PM By: Kalman Shan DO Previous Signature: 04/29/2021 1:15:48 PM Version By: Lorrin Jackson Entered By: Kalman Shan on 04/29/2021 13:26:32

## 2021-04-30 NOTE — Progress Notes (Signed)
ARCADIO, COPE ([id number]) Visit Report for 04/29/2021 Arrival Information Details Patient Name: Date of Service: [name] [name] 04/29/2021 12:45 PM Medical Record Number: [id number] Patient Account Number: [id number] Date of Birth/Sex: Treating RN: March 11, 1971 (50 y.o. [name] [name] Primary Care Rigby Leonhardt: [name] Other Clinician: Referring Nehemiah Montee: Treating Mariyam Remington/Extender: Delano Metz in Treatment: 15 Visit Information History Since Last Visit Added or deleted any medications: No Patient Arrived: Ambulatory Any new allergies or adverse reactions: No Arrival Time: 12:44 Had a fall or experienced change in No Accompanied By: alone activities of daily living that may affect Transfer Assistance: None risk of falls: Patient Identification Verified: Yes Signs or symptoms of abuse/neglect since last visito No Secondary Verification Process Completed: Yes Hospitalized since last visit: No Patient Requires Transmission-Based Precautions: No Implantable device outside of the clinic excluding No Patient Has Alerts: No cellular tissue based products placed in the center since last visit: Has Dressing in Place as Prescribed: Yes Pain Present Now: No Electronic Signature(s) Signed: 04/30/2021 5:57:05 PM By: Levan Hurst RN, BSN Entered By: Levan Hurst on 04/29/2021 12:44:41 -------------------------------------------------------------------------------- Encounter Discharge Information Details Patient Name: Date of Service: [name] Shire ND, [name] 04/29/2021 12:45 PM Medical Record Number: [id number] Patient Account Number: [id number] Date of Birth/Sex: Treating RN: Mar 21, 1971 (50 y.o. [name] [name] Primary Care Lorrine Killilea: [name] Other Clinician: Referring Fayez Sturgell: Treating Luqman Perrelli/Extender: Delano Metz in Treatment: 95 Encounter Discharge Information Items Post Procedure Vitals Discharge  Condition: Stable Temperature (F): 98.1 Ambulatory Status: Ambulatory Pulse (bpm): 89 Discharge Destination: Home Respiratory Rate (breaths/min): 16 Transportation: Private Auto Blood Pressure (mmHg): 127/77 Schedule Follow-up Appointment: Yes Clinical Summary of Care: Provided on 04/29/2021 Form Type Recipient Paper Patient Patient Electronic Signature(s) Signed: 04/29/2021 5:30:54 PM By: Lorrin Jackson Entered By: Lorrin Jackson on 04/29/2021 13:29:12 -------------------------------------------------------------------------------- Lower Extremity Assessment Details Patient Name: Date of Service: [name] ND, [name] 04/29/2021 12:45 PM Medical Record Number: [id number] Patient Account Number: [id number] Date of Birth/Sex: Treating RN: 1971/09/26 (50 y.o. [name] [name] Primary Care Caison Hearn: [name] Other Clinician: Referring Tamieka Rancourt: Treating [name] [name]/Extender: Delano Metz in Treatment: 36 Edema Assessment Assessed: [name] [name]: No] [name] [name]: No] Edema: [Left: Ye] [Right: s] Calf Left: Right: Point of Measurement: 50 cm From Medial Instep 40 cm Ankle Left: Right: Point of Measurement: 11 cm From Medial Instep 29 cm Vascular Assessment Pulses: Dorsalis Pedis Palpable: [Left:Yes] Electronic Signature(s) Signed: 04/30/2021 5:57:05 PM By: Levan Hurst RN, BSN Entered By: Levan Hurst on 04/29/2021 12:50:59 -------------------------------------------------------------------------------- Multi Wound Chart Details Patient Name: Date of Service: [name] Shire ND, [name] 04/29/2021 12:45 PM Medical Record Number: [id number] Patient Account Number: [id number] Date of Birth/Sex: Treating RN: 04-Jan-1971 (50 y.o. [name] [name], [name] [name] Primary Care Ailene Royal: [name] Other Clinician: Referring Doxie Augenstein: Treating Kaho Selle/Extender: Delano Metz in Treatment: 36 Vital Signs Height(in): 77 Capillary Blood  Glucose(mg/dl): 86 Weight(lbs): 232 Pulse(bpm): 63 Body Mass Index(BMI): 28 Blood Pressure(mmHg): 127/77 Temperature(F): 98.1 Respiratory Rate(breaths/min): 16 Photos: [2R:No Photos Left Metatarsal head first] [3:No Photos Right, Posterior Knee] [N/A:N/A N/A] Wound Location: [2R:Blister] [3:Pressure Injury] [N/A:N/A] Wounding Event: [2R:Diabetic Wound/Ulcer of the Lower] [3:Pressure Ulcer] [N/A:N/A] Primary Etiology: [2R:Extremity Anemia, Hypertension, Peripheral] [3:Anemia, Hypertension, Peripheral] [N/A:N/A] Comorbid History: [2R:Venous Disease, Type II Diabetes 06/23/2020] [3:Venous Disease, Type II Diabetes 03/31/2021] [N/A:N/A] Date Acquired: [2R:36] [3:3] [N/A:N/A] Weeks of Treatment: [2R:Open] [3:Open] [N/A:N/A] Wound Status: [2R:Yes] [3:No] [N/A:N/A] Wound Recurrence: [2R:No] [3:Yes] [N/A:N/A] Clustered Wound: [2R:N/A] [3:2] [N/A:N/A] Clustered Quantity: [2R:0.9x0.5x0.1] [3:1.2x2x0.2] [N/A:N/A]  Measurements L x W x D (cm) [0E:2.336] [3:1.885] [N/A:N/A] A (cm) : rea [2R:0.035] [3:0.377] [N/A:N/A] Volume (cm) : [2R:-1038.70%] [3:-15.90%] [N/A:N/A] % Reduction in A rea: [2R:-1066.70%] [3:-131.30%] [N/A:N/A] % Reduction in Volume: [2R:Grade 2] [3:Unstageable/Unclassified] [N/A:N/A] Classification: [2R:Medium] [3:Medium] [N/A:N/A] Exudate A mount: [2R:Serosanguineous] [3:Serosanguineous] [N/A:N/A] Exudate Type: [2R:red, brown] [3:red, brown] [N/A:N/A] Exudate Color: [2R:Thickened] [3:Flat and Intact] [N/A:N/A] Wound Margin: [2R:Large (67-100%)] [3:Small (1-33%)] [N/A:N/A] Granulation A mount: [2R:Pink] [3:Pink, Pale] [N/A:N/A] Granulation Quality: [2R:None Present (0%)] [3:Large (67-100%)] [N/A:N/A] Necrotic A mount: [2R:Fat Layer (Subcutaneous Tissue): Yes Fat Layer (Subcutaneous Tissue): Yes N/A] Exposed Structures: [2R:Fascia: No Tendon: No Muscle: No Joint: No Bone: No Small (1-33%)] [3:Fascia: No Tendon: No Muscle: No Joint: No Bone: No None] [N/A:N/A] Epithelialization:  [2R:Debridement - Excisional] [3:Debridement - Excisional] [N/A:N/A] Debridement: Pre-procedure Verification/Time Out 13:00 [3:13:00] [N/A:N/A] Taken: [2R:Subcutaneous] [3:Subcutaneous] [N/A:N/A] Tissue Debrided: [2R:Skin/Subcutaneous Tissue] [3:Skin/Subcutaneous Tissue] [N/A:N/A] Level: [2R:0.45] [3:0.6] [N/A:N/A] Debridement A (sq cm): [2R:rea Curette] [3:Curette] [N/A:N/A] Instrument: [2R:Minimum] [3:Minimum] [N/A:N/A] Bleeding: [2R:Pressure] [3:Pressure] [N/A:N/A] Hemostasis A chieved: [2R:Procedure was tolerated well] [3:Procedure was tolerated well] [N/A:N/A] Debridement Treatment Response: [2R:0.9x0.5x0.1] [3:1.2x2x0.2] [N/A:N/A] Post Debridement Measurements L x W x D (cm) [2R:0.035] [3:0.377] [N/A:N/A] Post Debridement Volume: (cm) [2R:N/A] [3:Unstageable/Unclassified] [N/A:N/A] Post Debridement Stage: [2R:Debridement] [3:Debridement] [N/A:N/A] Treatment Notes Electronic Signature(s) Signed: 04/29/2021 1:26:57 PM By: Kalman Shan DO Signed: 04/29/2021 5:27:47 PM By: Rhae Hammock RN Entered By: Kalman Shan on 04/29/2021 13:19:23 -------------------------------------------------------------------------------- Multi-Disciplinary Care Plan Details Patient Name: Date of Service: [name] Shire ND, [name] 04/29/2021 12:45 PM Medical Record Number: [id number] Patient Account Number: [id number] Date of Birth/Sex: Treating RN: 02-27-1971 (50 y.o. [name] [name] Primary Care Averey Koning: [name] Other Clinician: Referring Zayvien Canning: Treating Tomasa Dobransky/Extender: Delano Metz in Treatment: [place] reviewed with physician Active Inactive Wound/Skin Impairment Nursing Diagnoses: Impaired tissue integrity Goals: Patient/caregiver will verbalize understanding of skin care regimen Date Initiated: 11/19/2020 Target Resolution Date: 05/16/2021 Goal Status: Active Ulcer/skin breakdown will have a volume reduction of 30% by  week 4 Date Initiated: 08/16/2020 Date Inactivated: 03/03/2021 Target Resolution Date: 03/10/2021 Goal Status: Met Interventions: Provide education on ulcer and skin care Notes: Electronic Signature(s) Signed: 04/29/2021 1:14:49 PM By: Lorrin Jackson Entered By: Lorrin Jackson on 04/29/2021 13:14:48 -------------------------------------------------------------------------------- Pain Assessment Details Patient Name: Date of Service: [name] Shire ND, [name] 04/29/2021 12:45 PM Medical Record Number: [id number] Patient Account Number: [id number] Date of Birth/Sex: Treating RN: December 29, 1970 (50 y.o. [name] [name] Primary Care Treyvion Durkee: [name] Other Clinician: Referring Glenn Christo: Treating Laveah Gloster/Extender: Delano Metz in Treatment: 1 Active Problems Location of Pain Severity and Description of Pain Patient Has Paino No Site Locations Pain Management and Medication Current Pain Management: Electronic Signature(s) Signed: 04/30/2021 5:57:05 PM By: Levan Hurst RN, BSN Entered By: Levan Hurst on 04/29/2021 12:45:23 -------------------------------------------------------------------------------- Patient/Caregiver Education Details Patient Name: Date of Service: [name] [name] 6/7/2022andnbsp12:45 PM Medical Record Number: [id number] Patient Account Number: [id number] Date of Birth/Gender: Treating RN: 16-Sep-1971 (50 y.o. [name] [name] Primary Care Physician: [name] Other Clinician: Referring Physician: Treating Physician/Extender: Delano Metz in Treatment: 17 Education Assessment Education Provided To: Patient Education Topics Provided Wound/Skin Impairment: Methods: Explain/Verbal, Printed Responses: State content correctly [employer]) Signed: 04/29/2021 5:30:54 PM By: Lorrin Jackson Entered By: Lorrin Jackson on 04/29/2021  13:15:07 -------------------------------------------------------------------------------- Wound Assessment Details Patient Name: Date of Service: [place], [place] L. 04/29/2021 12:45 PM Medical Record Number: [id number] Patient Account Number: [id number] Date of Birth/Sex: Treating RN: 16-May-1971 (50  y.o. [name]) Donnal Debar, [place] Primary Care Rease Swinson: [name] Other Clinician: Referring Jiaire Rosebrook: Treating Anikah Hogge/Extender: Delano Metz in Treatment: 36 Wound Status Wound Number: 2R Primary Diabetic Wound/Ulcer of the Lower Extremity Etiology: Wound Location: Left Metatarsal head first Wound Status: Open Wounding Event: Blister Comorbid Anemia, Hypertension, Peripheral Venous Disease, Type II Date Acquired: 06/23/2020 History: Diabetes Weeks Of Treatment: 36 Clustered Wound: No Photos Wound Measurements Length: (cm) 0.9 Width: (cm) 0.5 Depth: (cm) 0.1 Area: (cm) 0.353 Volume: (cm) 0.035 % Reduction in Area: -1038.7% % Reduction in Volume: -1066.7% Epithelialization: Small (1-33%) Tunneling: No Undermining: No Wound Description Classification: Grade 2 Wound Margin: Thickened Exudate Amount: Medium Exudate Type: Serosanguineous Exudate Color: red, brown Foul Odor After Cleansing: No Slough/Fibrino No Wound Bed Granulation Amount: Large (67-100%) Exposed Structure Granulation Quality: Pink Fascia Exposed: No Necrotic Amount: None Present (0%) Fat Layer (Subcutaneous Tissue) Exposed: Yes Tendon Exposed: No Muscle Exposed: No Joint Exposed: No Bone Exposed: No Treatment Notes Wound #2R (Metatarsal head first) Wound Laterality: Left Cleanser Wound Cleanser Discharge Instruction: Cleanse the wound with wound cleanser prior to applying a clean dressing using gauze sponges, not tissue or cotton balls. Soap and Water Discharge Instruction: May shower and wash wound with dial antibacterial soap and water prior to dressing  change. Peri-Wound Care Sween Lotion (Moisturizing lotion) Discharge Instruction: Apply moisturizing lotion as directed Topical Primary Dressing KerraCel Ag Gelling Fiber Dressing, 4x5 in (silver alginate) Discharge Instruction: Apply silver alginate to wound bed as instructed Secondary Dressing Woven Gauze Sponges 2x2 in Discharge Instruction: Apply over primary dressing as directed. Optifoam Non-Adhesive Dressing, 4x4 in Discharge Instruction: Apply over primary dressing cut to make foam donut to help offload Secured With [place] [facility] T ape, 2x2 (in/yd) Discharge Instruction: Secure dressing with tape as directed. Conforming Stretch Gauze Bandage, Sterile 2x75 (in/in) Discharge Instruction: Secure with stretch gauze as directed. Compression Wrap Compression Stockings Add-Ons Electronic Signature(s) Signed: 04/29/2021 5:03:12 PM By: Sandre Kitty Signed: 04/30/2021 5:57:05 PM By: Levan Hurst RN, BSN Entered By: Sandre Kitty on 04/29/2021 16:48:36 -------------------------------------------------------------------------------- Wound Assessment Details Patient Name: Date of Service: [name] ND, [name] 04/29/2021 12:45 PM Medical Record Number: [id number] Patient Account Number: [id number] Date of Birth/Sex: Treating RN: 04/01/1971 (50 y.o. [name] [name] Primary Care Xela Oregel: [name] Other Clinician: Referring Quintella Mura: Treating Anvith Mauriello/Extender: Delano Metz in Treatment: 36 Wound Status Wound Number: 3 Primary Pressure Ulcer Etiology: Wound Location: Right, Posterior Knee Wound Status: Open Wounding Event: Pressure Injury Comorbid Anemia, Hypertension, Peripheral Venous Disease, Type II Date Acquired: 03/31/2021 History: Diabetes Weeks Of Treatment: 3 Clustered Wound: Yes Photos Wound Measurements Length: (cm) 1.2 Width: (cm) 2 Depth: (cm) 0.2 Clustered Quantity: 2 Area: (cm) 1.885 Volume:  (cm) 0.377 % Reduction in Area: -15.9% % Reduction in Volume: -131.3% Epithelialization: None Tunneling: No Undermining: No Wound Description Classification: Unstageable/Unclassified Wound Margin: Flat and Intact Exudate Amount: Medium Exudate Type: Serosanguineous Exudate Color: red, brown Foul Odor After Cleansing: No Slough/Fibrino Yes Wound Bed Granulation Amount: Small (1-33%) Exposed Structure Granulation Quality: Pink, Pale Fascia Exposed: No Necrotic Amount: Large (67-100%) Fat Layer (Subcutaneous Tissue) Exposed: Yes Necrotic Quality: Adherent Slough Tendon Exposed: No Muscle Exposed: No Joint Exposed: No Bone Exposed: No Treatment Notes Wound #3 (Knee) Wound Laterality: Right, Posterior Cleanser Wound Cleanser Discharge Instruction: Cleanse the wound with wound cleanser prior to applying a clean dressing using gauze sponges, not tissue or cotton balls. Soap and Water Discharge Instruction: May shower and wash wound  with dial antibacterial soap and water prior to dressing change. Peri-Wound Care Topical Triple Antibiotic Ointment, 1 (oz) Tube Primary Dressing Secondary Dressing Zetuvit Plus Silicone Border Dressing 4x4 (in/in) Discharge Instruction: Apply silicone border over primary dressing as directed. Secured With Compression Wrap Compression Stockings Environmental education officer) Signed: 04/29/2021 5:03:12 PM By: Sandre Kitty Signed: 04/30/2021 5:57:05 PM By: Levan Hurst RN, BSN Entered By: Sandre Kitty on 04/29/2021 16:49:03 -------------------------------------------------------------------------------- [place] Details Patient Name: Date of Service: [name] ND, [name] 04/29/2021 12:45 PM Medical Record Number: [id number] Patient Account Number: [id number] Date of Birth/Sex: Treating RN: Mar 02, 1971 (50 y.o. [name] [name] Primary Care Bronte Sabado: [name] Other Clinician: Referring Alexius Ellington: Treating [place]/Extender: Delano Metz in Treatment: 36 Vital Signs Time Taken: 12:44 Temperature (F): 98.1 Height (in): 77 Pulse (bpm): 89 Weight (lbs): 232 Respiratory Rate (breaths/min): 16 Body Mass Index (BMI): 27.5 Blood Pressure (mmHg): 127/77 Capillary Blood Glucose (mg/dl): 86 Reference Range: 80 - 120 mg / dl Notes glucose per pt report Electronic Signature(s) Signed: 04/30/2021 5:57:05 PM By: Levan Hurst RN, BSN Entered By: Levan Hurst on 04/29/2021 12:45:17

## 2021-05-12 ENCOUNTER — Other Ambulatory Visit: Payer: Self-pay

## 2021-05-12 ENCOUNTER — Encounter (HOSPITAL_COMMUNITY)
Admission: RE | Admit: 2021-05-12 | Discharge: 2021-05-12 | Disposition: A | Discharge status: Home / Self Care | Payer: Medicare Other | Source: Ambulatory Visit | Attending: Gastroenterology | Admitting: Gastroenterology

## 2021-05-12 DIAGNOSIS — Z862 Personal history of diseases of the blood and blood-forming organs and certain disorders involving the immune mechanism: Secondary | ICD-10-CM | POA: Diagnosis present

## 2021-05-12 DIAGNOSIS — R112 Nausea with vomiting, unspecified: Secondary | ICD-10-CM | POA: Insufficient documentation

## 2021-05-12 DIAGNOSIS — N189 Chronic kidney disease, unspecified: Secondary | ICD-10-CM | POA: Insufficient documentation

## 2021-05-12 DIAGNOSIS — K920 Hematemesis: Secondary | ICD-10-CM | POA: Diagnosis present

## 2021-05-12 MED ORDER — TECHNETIUM TC 99M SULFUR COLLOID
2.2000 | Freq: Once | INTRAVENOUS | Status: AC
  Administered 2021-05-12: 2.2 via ORAL

## 2021-05-13 ENCOUNTER — Encounter (HOSPITAL_BASED_OUTPATIENT_CLINIC_OR_DEPARTMENT_OTHER): Payer: Medicare Other | Admitting: Internal Medicine

## 2021-05-13 DIAGNOSIS — L97522 Non-pressure chronic ulcer of other part of left foot with fat layer exposed: Secondary | ICD-10-CM | POA: Diagnosis not present

## 2021-05-13 NOTE — Progress Notes (Signed)
JAYEN, BROMWELL ([id number]) Visit Report for 05/13/2021 Chief Complaint Document Details Patient Name: Date of Service: [name] 05/13/2021 12:30 PM Medical Record Number: [id number] Patient Account Number: [id number] Date of Birth/Sex: Treating RN: Aug 19, 1971 (50 y.o. [name] Primary Care Provider: [name] Other Clinician: Referring Provider: Treating Provider/Extender: Delano Metz in Treatment: 65 Information Obtained from: Patient Chief Complaint patient is here for a review of the wound on his plantar left first metatarsal head and back of his right leg Electronic Signature(s) Signed: 05/13/2021 2:19:10 PM By: Kalman Shan DO Entered By: Kalman Shan on 05/13/2021 14:12:42 -------------------------------------------------------------------------------- Debridement Details Patient Name: Date of Service: [name], [name] 05/13/2021 12:30 PM Medical Record Number: [id number] Patient Account Number: [id number] Date of Birth/Sex: Treating RN: 05/27/1971 (50 y.o. [name], [name] Primary Care Provider: [name] Other Clinician: Referring Provider: Treating Provider/Extender: Delano Metz in Treatment: 38 Debridement Performed for Assessment: Wound #2R Left Metatarsal head first Performed By: Physician Kalman Shan, DO Debridement Type: Debridement Severity of Tissue Pre Debridement: Fat layer exposed Level of Consciousness (Pre-procedure): Awake and Alert Pre-procedure Verification/Time Out Yes - 12:55 Taken: Start Time: 12:56 Pain Control: Lidocaine 4% T opical Solution T Area Debrided (L x W): otal 1 (cm) x 0.5 (cm) = 0.5 (cm) Tissue and other material debrided: Viable, Non-Viable, Callus, Subcutaneous, Skin: Dermis , Skin: Epidermis, Fibrin/Exudate Level: Skin/Subcutaneous Tissue Debridement Description: Excisional Instrument: Curette Bleeding: Minimum Hemostasis  Achieved: Pressure End Time: 13:02 Procedural Pain: 0 Post Procedural Pain: 0 Response to Treatment: Procedure was tolerated well Level of Consciousness (Post- Awake and Alert procedure): Post Debridement Measurements of Total Wound Length: (cm) 0.6 Width: (cm) 0.3 Depth: (cm) 0.2 Volume: (cm) 0.028 Character of Wound/Ulcer Post Debridement: Improved Severity of Tissue Post Debridement: Fat layer exposed Post Procedure Diagnosis Same as Pre-procedure Electronic Signature(s) Signed: 05/13/2021 2:19:10 PM By: Kalman Shan DO Signed: 05/13/2021 4:54:56 PM By: Deon Pilling Entered By: Deon Pilling on 05/13/2021 13:05:23 -------------------------------------------------------------------------------- HPI Details Patient Name: Date of Service: [name], [name] 05/13/2021 12:30 PM Medical Record Number: [id number] Patient Account Number: [id number] Date of Birth/Sex: Treating RN: 11-03-1971 (50 y.o. [name] Primary Care Provider: [name] Other Clinician: Referring Provider: Treating Provider/Extender: Delano Metz in Treatment: 49 History of Present Illness HPI Description: 04/21/18 ADMISSION This is a 50 year old man who is a type II diabetic. In spite of fact his hemoglobin A1c is actually quite good 5.73 months ago he is at a really difficult time over the last 6-7 months. He developed a rapidly progressive infection in the right foot in November 2018 associated with osteomyelitis and necrotizing fasciitis. 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 The stump required and a revision on 11/24/17. The stump revision was advertised as being secondary to falls. I'm not sure the progressive history here however this area is actually closed over. The patient tells me over the same timeframe he has had wounds on the plantar aspect of his left foot. In the ED saw Dr. Sharol Given in April of this year. Noted that have Wagner grade 1 diabetic ulcers on the  fifth didn't first metatarsal heads. It is really not clear that this patient is been dressing this with anything. He came in with the clinic without any specific dressing on the wound areas using his own tennis shoe. He tells me that he only ambulates of course to do a pivot transfer. He does not  have his prosthesis for the right leg as of yet and he blames Medicaid for this. He does however use the foot to push himself along in his wheelchair at home. The patient has not had formal arterial studies. ABI in our clinic on the left was 1.13. Patient's past medical history includes type 2 diabetes, fracture of the right fibula. I see that he was treated for abscesses on his right buttock and chest in 2016. I did not look at the microbiology of this. 04/28/18; patient comes back in the clinic today with the wound pretty much the same as when he came in here last week. Small opening lots of undermining relatively. He tells [place] that nothing is really been on this for 3 days in spite of the fact that we gave him enough to dress this easily especially such a small wound. He says he lives with his mother, she is not capable of assisting with this he is changing the dressings himself. 05/05/18; much better-looking wound today. Smaller. There is some undermining medially however that's only perhaps 2 mm. No surrounding erythema. We've been using silver collagen 05/12/18; small wound on the first metatarsal head. No undermining no surrounding erythema. We've been using silver collagen he has home health coming out to change 05/20/18 the wound on the first metatarsal head looks better. Covered in surface debris/callus nonviable tissue. Required debridement but post debridement this looks quite good. We've been using silver collagen. He has home health 05/27/18; first metatarsal head wound continues to improve. Just about completely closed. Still a lot of surface debridement callus. We've been using  silver collagen 06/06/18; the first metatarsal wound is completely healed over. There is still a lot of callus. I gently removed some of this just to make sure that there was no open area and there is not. The patient mentions to me that his left leg has felt like "lead" for about a week READMISSION 08/16/2020 This is a 50 year old man with type 2 diabetes. He returns to clinic today with a 1 month history of a blister and callus over the first metatarsal head. This is in exactly the same area as when he was here in 2019. 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 He has standard running shoes on the left foot to match the area in his prosthesis. He has only been covering this area with a Band-Aid has not really been specifically dressing this. ABI in our clinic on the left was 1.16. This is essentially stable from the value in 2019 10/1 the patient has a linear wound over the left first metatarsal head. Again not a lot of callus thick skin around this that I removed with a #3 curette. This is not go deep to bone or muscle it does not look to be infected. 10/8 first metatarsal head. The wound looks like it is closing however this is going to be a difficult area to offload in the future. He has a size 15 shoe and he says his shoes are years old. The inserts in these current shoes are totally useless and I have advised him to replace these. He may need a new pair of shoes and I have he got original prosthesis at [employer] [place] I have asked him to go back there. 11/9; the patient has not been here in more than a month. Not sure he was healed when he was here the last time. I  told him he would have to get new shoes and certainly offload the area over the left first metatarsal head. He has done nothing of the kind. He arrives back in clinic with the same old new balance sneakers. A very large separating callus over the first metatarsal head on the  left 11/16; he has purchased new shoes and has at least 2 insoles like I asked. The wound is measuring much smaller. He is using silver alginate he changes the dressing himself 11/30; wound is measurably smaller in length of about a half a centimeter. This is generally very little depth. We have been using silver alginate. He changes the dressing himself every second day. 12/14; wound is about the same size. Significant undermining medially relative to the size of the wound. We have been using silver alginate. There is not a way to offload this area as he walks with a prosthesis on the right leg 12/28; wound is about the same size. Again he has undermining medially. I am using polymen on this 12/02/2020; the wound looks smaller. Still a senescent edge. We have been using polymen 1/24; the wound is come down a few millimeters. Still a senescent edge. I have been using polymen 2/8; the wound is come down slightly. Still with slight undermining from 12-6 o'clock I have been using polymen partially to get offloading on this area which is opposed by his prosthesis on the other leg. 2/22 quite open improvement he still has a comma shaped wound on the left first metatarsal head. Some depth. Using polymen 3/14; he still has the same problem of a comma shaped area over the left first metatarsal head. This seems to have had more depth today at 0.6 cm. We have been using polymen. The patient changes this himself every second day. I explored the idea of a total contact cast on the left leg however this is his driving foot. He was not enamored with the idea of not being able to drive and states that he does not have anybody else to get him around 3/28; again he comes in with a smaller looking wound however there is depth and undermining requiring debridement. We have been using Hydrofera Blue, changed to silver collagen today. The patient is doing the dressing himself 4/11; patient presents today for follow-up  of his left diabetic foot wound. He denies any issues in the past 2 weeks. He is currently using silver collagen every other day with dressing changes. He does not use diabetic shoes or special inserts to his sneakers. He does not use felt pads for offloading. 4/19; patient presents for his 1 week follow-up. He denies any issues. He reports using Hydrofera Blue and has not been using silver collagen for dressing changes. He reports minimal drainage to the wound. He overall feels well. 5/3; patient presents for 2-week follow-up. He has been using silver collagen without issues. He has no complaints today and states he overall feels well. 5/17; patient presents for 2-week follow-up. He states that the sleeve is rubbing up against the back of his right leg and causing a wound. He states this happened a week ago and has not been dressing the wound. The plantar wound is stable and he has been using collagen every other day. 5/24; Patient was had a recent hospitalization for cellulitis of his right popliteal fossa. He was admitted and started on IV antibiotics. He does not recall the plantar wound dressed while inpatient. He feels a lot better today. He was discharged  on 5/21 on oral antibiotics and has not picked this up until today. He reports using collagen to the plantar wound since discharge. He is keeping the right posterior knee wound covered. He is getting a new sleeve for his prostatic on 5/26. He currently denies signs of infection. 6/1; patient presents for 1 week follow-up. He is still on doxycycline. He has not been dressing the back of the right knee wound. He states he is receiving his new prosthetic sleeve tomorrow. He states he is using silver alginate to the plantar foot wound. He currently denies signs of infection. He states he has not been offloading the left foot wound 6/7; patient presents for 1 week follow-up. He completes his last dose of doxycycline today. He finally received his  prosthetic sleeve for the right side and he states it feels much better. He has been using antibiotic ointment to the back of that right knee wound. He has been using silver alginate to the plantar foot wound. He currently denies signs of infection. 6/21; patient presents for 2-week follow-up. He reports no issues with his prosthetic sleeve. He has been using antibiotic ointment to the back of the right knee. He has been using silver alginate to the plantar foot wound. He has no issues or complaints today. He denies signs of infection. Electronic Signature(s) Signed: 05/13/2021 2:19:10 PM By: Kalman Shan DO Entered By: Kalman Shan on 05/13/2021 14:13:27 -------------------------------------------------------------------------------- Physical Exam Details Patient Name: Date of Service: [name], [name] 05/13/2021 12:30 PM Medical Record Number: [id number] Patient Account Number: [id number] Date of Birth/Sex: Treating RN: 1971-01-12 (50 y.o. [name] Primary Care Provider: [name] Other Clinician: Referring Provider: Treating Provider/Extender: Delano Metz in Treatment: 43 Constitutional respirations regular, non-labored and within target range for patient.Marland Kitchen Psychiatric pleasant and cooperative. Notes Left first met head: Wound bed with granulation tissue and nonviable tissue present. Some callus and maceration circumferentially although greatly improved from last clinic visit. No signs of infection. Posterior right knee: a wound with skin breakdown with no signs of infection. mostly fibrinous tissue present. Electronic Signature(s) Signed: 05/13/2021 2:19:10 PM By: Kalman Shan DO Entered By: Kalman Shan on 05/13/2021 14:14:30 -------------------------------------------------------------------------------- Physician Orders Details Patient Name: Date of Service: [name], [name] 05/13/2021 12:30 PM Medical Record  Number: [id number] Patient Account Number: [id number] Date of Birth/Sex: Treating RN: 06/13/1971 (50 y.o. 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 - Dr. Heber [place] Tuesday Return A Bathing/ Shower/ Hygiene May shower and wash wound with soap and water. - WITH DRESSING CHANGES. Edema Control - Lymphedema / SCD / Other Elevate legs to the level of the heart or above for 30 minutes daily and/or when sitting, a frequency of: Avoid standing for long periods of time. Wound Treatment Wound #2R - Metatarsal head first Wound Laterality: Left Cleanser: Soap and Water 1 x Per WGY/65 Days Discharge Instructions: May shower and wash wound with dial antibacterial soap and water prior to dressing change. Cleanser: Wound Cleanser (Generic) 1 x Per Day/15 Days Discharge  Instructions: Cleanse the wound with wound cleanser prior to applying a clean dressing using gauze sponges, not tissue or cotton balls. Peri-Wound Care: Sween Lotion (Moisturizing lotion) 1 x Per Day/15 Days Discharge Instructions: Apply moisturizing lotion as directed Prim Dressing: KerraCel Ag Gelling Fiber Dressing, 4x5 in (silver alginate) 1 x Per Day/15 Days ary Discharge Instructions: Apply silver alginate to wound bed as instructed Secondary Dressing: Woven Gauze Sponges 2x2 in (Generic) 1 x Per Day/15 Days Discharge Instructions: Apply over primary dressing as  directed. Secondary Dressing: Optifoam Non-Adhesive Dressing, 4x4 in (Generic) 1 x Per Day/15 Days Discharge Instructions: Apply over primary dressing cut to make foam donut to help offload Secured With: Conforming Stretch Gauze Bandage, Sterile 2x75 (in/in) (Generic) 1 x Per Day/15 Days Discharge Instructions: Secure with stretch gauze as directed. Secured With: 63M Medipore H Soft Cloth Surgical Tape, 2x2 (in/yd) (Generic) 1 x Per Day/15 Days Discharge Instructions: Secure dressing with tape as directed. Wound #3 - Knee Wound Laterality: Right, Posterior Cleanser: Soap and Water 1 x Per XIH/03 Days Discharge Instructions: May shower and wash wound with dial antibacterial soap and water prior to dressing change. Cleanser: Wound Cleanser (Generic) 1 x Per Day/15 Days Discharge Instructions: Cleanse the wound with wound cleanser prior to applying a clean dressing using gauze sponges, not tissue or cotton balls. Topical: Triple Antibiotic Ointment, 1 (oz) Tube 1 x Per Day/15 Days Secondary Dressing: Zetuvit Plus Silicone Border Dressing 4x4 (in/in) 1 x Per Day/15 Days Discharge Instructions: Apply silicone border over primary dressing as directed. Electronic Signature(s) Signed: 05/13/2021 2:19:10 PM By: Kalman Shan DO Entered By: Kalman Shan on 05/13/2021 14:14:44 -------------------------------------------------------------------------------- Problem List Details Patient Name: Date of Service: [name], [place] 05/13/2021 12:30 PM Medical Record Number: [id number] Patient Account Number: [id number] Date of Birth/Sex: Treating RN: [birth year] (50 y.o. [name] Primary Care Provider: [name] Other Clinician: Referring Provider: Treating Provider/Extender: Delano Metz in Treatment: 22 Active Problems ICD-10 Encounter Code Description Active Date MDM Diagnosis E11.621 Type 2 diabetes mellitus with foot ulcer 08/16/2020 No  Yes S81.801D Unspecified open wound, right lower leg, subsequent encounter 04/08/2021 No Yes L97.522 Non-pressure chronic ulcer of other part of left foot with fat layer exposed 08/16/2020 No Yes E11.42 Type 2 diabetes mellitus with diabetic polyneuropathy 08/16/2020 No Yes Z89.511 Acquired absence of right leg below knee 08/16/2020 No Yes Inactive Problems Resolved Problems Electronic Signature(s) Signed: 05/13/2021 2:19:10 PM By: Kalman Shan DO Entered By: Kalman Shan on 05/13/2021 14:12:25 -------------------------------------------------------------------------------- Progress Note Details Patient Name: Date of Service: [name], [name] 05/13/2021 12:30 PM Medical Record Number: [id number] Patient Account Number: [id number] Date of Birth/Sex: Treating RN: 1970/12/15 (50 y.o. [name] Primary Care Provider: [name] Other Clinician: Referring Provider: Treating Provider/Extender: Delano Metz in Treatment: 14 Subjective Chief Complaint Information obtained from Patient patient is here for a review of the wound on his plantar left first metatarsal head and back of his right leg History of Present Illness (HPI) 04/21/18 ADMISSION This is a 50 year old man who is a type II diabetic. In spite of fact his hemoglobin A1c is actually quite good 5.73 months ago he is at a really difficult time over the last 6-7 months. He developed a rapidly progressive infection in the right foot in November 2018 associated with osteomyelitis and necrotizing fasciitis. 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 The stump required and a revision on 11/24/17. The stump revision was advertised as  being secondary to falls. I'm not sure the progressive history here however this area is actually closed over. The patient tells me over the same timeframe he has had wounds on the plantar aspect of his left foot. In the ED saw Dr. Sharol Given in April of this year. Noted that  have Wagner grade 1 diabetic ulcers on the fifth didn't first metatarsal heads. It is really not clear that this patient is been dressing this with anything. He came in with the clinic without any specific dressing on the wound areas using his own tennis shoe. He tells me that he only ambulates of course to do a pivot transfer. He does not have his prosthesis for the right leg as of yet and he blames Medicaid for this. He does however use the foot to push himself along in his wheelchair at home. The patient has not had formal arterial studies. ABI in our clinic on the left was 1.13. Patient's past medical history includes type 2 diabetes, fracture of the right fibula. I see that he was treated for abscesses on his right buttock and chest in 2016. I did not look at the microbiology of this. 04/28/18; patient comes back in the clinic today with the wound pretty much the same as when he came in here last week. Small opening lots of undermining relatively. He tells [place] that nothing is really been on this for 3 days in spite of the fact that we gave him enough to dress this easily especially such a small wound. He says he lives with his mother, she is not capable of assisting with this he is changing the dressings himself. 05/05/18; much better-looking wound today. Smaller. There is some undermining medially however that's only perhaps 2 mm. No surrounding erythema. We've been using silver collagen 05/12/18; small wound on the first metatarsal head. No undermining no surrounding erythema. We've been using silver collagen he has home health coming out to change 05/20/18 the wound on the first metatarsal head looks better. Covered in surface debris/callus nonviable tissue. Required debridement but post debridement this looks quite good. We've been using silver collagen. He has home health 05/27/18; first metatarsal head wound continues to improve. Just about completely closed. Still a lot of surface debridement  callus. We've been using silver collagen 06/06/18; the first metatarsal wound is completely healed over. There is still a lot of callus. I gently removed some of this just to make sure that there was no open area and there is not. The patient mentions to me that his left leg has felt like "lead" for about a week READMISSION 08/16/2020 This is a 50 year old man with type 2 diabetes. He returns to clinic today with a 1 month history of a blister and callus over the first metatarsal head. This is in exactly the same area as when he was here in 2019. 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 He has standard running shoes on the left foot to match the area in his prosthesis. He has only been covering this area with a Band-Aid has not really been specifically dressing this. ABI in our clinic on the left was 1.16. This is essentially stable from the value in 2019 10/1 the patient has a linear wound over the left first metatarsal head. Again not a lot of callus thick skin around this that I removed with a #3 curette. This is not go deep to bone or muscle it  does not look to be infected. 10/8 first metatarsal head. The wound looks like it is closing however this is going to be a difficult area to offload in the future. He has a size 15 shoe and he says his shoes are years old. The inserts in these current shoes are totally useless and I have advised him to replace these. He may need a new pair of shoes and I have he got original prosthesis at [employer] [place] [facility] [place] I have asked him to go back there. 11/9; the patient has not been here in more than a month. Not sure he was healed when he was here the last time. I told him he would have to get new shoes and certainly offload the area over the left first metatarsal head. He has done nothing of the kind. He arrives back in clinic with the same old new balance sneakers. A very large separating callus over the first  metatarsal head on the left 11/16; he has purchased new shoes and has at least 2 insoles like I asked. The wound is measuring much smaller. He is using silver alginate he changes the dressing himself 11/30; wound is measurably smaller in length of about a half a centimeter. This is generally very little depth. We have been using silver alginate. He changes the dressing himself every second day. 12/14; wound is about the same size. Significant undermining medially relative to the size of the wound. We have been using silver alginate. There is not a way to offload this area as he walks with a prosthesis on the right leg 12/28; wound is about the same size. Again he has undermining medially. I am using polymen on this 12/02/2020; the wound looks smaller. Still a senescent edge. We have been using polymen 1/24; the wound is come down a few millimeters. Still a senescent edge. I have been using polymen 2/8; the wound is come down slightly. Still with slight undermining from 12-6 o'clock I have been using polymen partially to get offloading on this area which is opposed by his prosthesis on the other leg. 2/22 quite open improvement he still has a comma shaped wound on the left first metatarsal head. Some depth. Using polymen 3/14; he still has the same problem of a comma shaped area over the left first metatarsal head. This seems to have had more depth today at 0.6 cm. We have been using polymen. The patient changes this himself every second day. I explored the idea of a total contact cast on the left leg however this is his driving foot. He was not enamored with the idea of not being able to drive and states that he does not have anybody else to get him around 3/28; again he comes in with a smaller looking wound however there is depth and undermining requiring debridement. We have been using Hydrofera Blue, changed to silver collagen today. The patient is doing the dressing himself 4/11; patient  presents today for follow-up of his left diabetic foot wound. He denies any issues in the past 2 weeks. He is currently using silver collagen every other day with dressing changes. He does not use diabetic shoes or special inserts to his sneakers. He does not use felt pads for offloading. 4/19; patient presents for his 1 week follow-up. He denies any issues. He reports using Hydrofera Blue and has not been using silver collagen for dressing changes. He reports minimal drainage to the wound. He overall feels well. 5/3; patient presents for  2-week follow-up. He has been using silver collagen without issues. He has no complaints today and states he overall feels well. 5/17; patient presents for 2-week follow-up. He states that the sleeve is rubbing up against the back of his right leg and causing a wound. He states this happened a week ago and has not been dressing the wound. The plantar wound is stable and he has been using collagen every other day. 5/24; Patient was had a recent hospitalization for cellulitis of his right popliteal fossa. He was admitted and started on IV antibiotics. He does not recall the plantar wound dressed while inpatient. He feels a lot better today. He was discharged on 5/21 on oral antibiotics and has not picked this up until today. He reports using collagen to the plantar wound since discharge. He is keeping the right posterior knee wound covered. He is getting a new sleeve for his prostatic on 5/26. He currently denies signs of infection. 6/1; patient presents for 1 week follow-up. He is still on doxycycline. He has not been dressing the back of the right knee wound. He states he is receiving his new prosthetic sleeve tomorrow. He states he is using silver alginate to the plantar foot wound. He currently denies signs of infection. He states he has not been offloading the left foot wound 6/7; patient presents for 1 week follow-up. He completes his last dose of doxycycline  today. He finally received his prosthetic sleeve for the right side and he states it feels much better. He has been using antibiotic ointment to the back of that right knee wound. He has been using silver alginate to the plantar foot wound. He currently denies signs of infection. 6/21; patient presents for 2-week follow-up. He reports no issues with his prosthetic sleeve. He has been using antibiotic ointment to the back of the right knee. He has been using silver alginate to the plantar foot wound. He has no issues or complaints today. He denies signs of infection. Patient History Information obtained from Patient. Family History Diabetes - Maternal Grandparents,Mother, Heart Disease - Father, Hypertension - Father, No family history of Cancer, Hereditary Spherocytosis, Kidney Disease, Lung Disease, Seizures, Stroke, Thyroid Problems, Tuberculosis. Social History Current every day smoker, Marital Status - Single, Alcohol Use - Never, Drug Use - No History, Caffeine Use - Never. Medical History Eyes Denies history of Cataracts, Glaucoma, Optic Neuritis Ear/Nose/Mouth/Throat Denies history of Chronic sinus problems/congestion, Middle ear problems Hematologic/Lymphatic Patient has history of Anemia Denies history of Hemophilia, Human Immunodeficiency Virus, Lymphedema, Sickle Cell Disease Respiratory Denies history of Aspiration, Asthma, Chronic Obstructive Pulmonary Disease (COPD), Pneumothorax, Sleep Apnea, Tuberculosis Cardiovascular Patient has history of Hypertension, Peripheral Venous Disease Denies history of Angina, Arrhythmia, Congestive Heart Failure, Coronary Artery Disease, Deep Vein Thrombosis, Hypotension, Myocardial Infarction, Peripheral Arterial Disease, Phlebitis, Vasculitis Gastrointestinal Denies history of Cirrhosis , Colitis, Crohnoos, Hepatitis A, Hepatitis B, Hepatitis C Endocrine Patient has history of Type II Diabetes - oral meds Denies history of Type I  Diabetes Genitourinary Denies history of End Stage Renal Disease Immunological Denies history of Lupus Erythematosus, Raynaudoos, Scleroderma Integumentary (Skin) Denies history of History of Burn Musculoskeletal Denies history of Gout, Rheumatoid Arthritis, Osteoarthritis, Osteomyelitis Neurologic Denies history of Dementia, Neuropathy, Quadriplegia, Paraplegia, Seizure Disorder Oncologic Denies history of Received Chemotherapy, Received Radiation Psychiatric Denies history of Anorexia/bulimia, Confinement Anxiety Hospitalization/Surgery History - fever 105, sepsis cellulitis right popliteal fossa 04/09/2021. Medical A Surgical History Notes nd Constitutional Symptoms (General Health) falls , leukocytosis Respiratory During waking hours  and catches himself not breathing, "gasp for air". Saw Dr Wynonia Lawman, he couldn't find a reason Objective Constitutional respirations regular, non-labored and within target range for patient.. Vitals Time Taken: 12:40 PM, Height: 77 in, Weight: 232 lbs, BMI: 27.5, Temperature: 99.1 F, Pulse: 91 bpm, Respiratory Rate: 18 breaths/min, Blood Pressure: 128/77 mmHg. Psychiatric pleasant and cooperative. General Notes: Left first met head: Wound bed with granulation tissue and nonviable tissue present. Some callus and maceration circumferentially although greatly improved from last clinic visit. No signs of infection. Posterior right knee: a wound with skin breakdown with no signs of infection. mostly fibrinous tissue present. Integumentary (Hair, Skin) Wound #2R status is Open. Original cause of wound was Blister. The date acquired was: 06/23/2020. The wound has been in treatment 38 weeks. The wound is located on the Left Metatarsal head first. The wound measures 0.6cm length x 0.3cm width x 0.2cm depth; 0.141cm^2 area and 0.028cm^3 volume. There is Fat Layer (Subcutaneous Tissue) exposed. There is no tunneling noted, however, there is undermining starting  at 12:00 and ending at 12:00 with a maximum distance of 0.2cm. There is a medium amount of serosanguineous drainage noted. The wound margin is thickened. There is large (67-100%) pink granulation within the wound bed. There is no necrotic tissue within the wound bed. General Notes: callous to periwound. Wound #3 status is Open. Original cause of wound was Pressure Injury. The date acquired was: 03/31/2021. The wound has been in treatment 5 weeks. The wound is located on the Right,Posterior Knee. The wound measures 1cm length x 0.4cm width x 0.1cm depth; 0.314cm^2 area and 0.031cm^3 volume. There is Fat Layer (Subcutaneous Tissue) exposed. There is no tunneling or undermining noted. There is a medium amount of serosanguineous drainage noted. The wound margin is flat and intact. There is small (1-33%) pink, pale granulation within the wound bed. There is a large (67-100%) amount of necrotic tissue within the wound bed including Adherent Slough. 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 The left plantar wound had some nonviable tissue and callus that was debrided. Overall no signs of infection. Discussed aggressive offloading. Not sure if this is being done. We will continue with silver alginate to this area. He can follow-up in 2 weeks Procedures Wound #2R Pre-procedure diagnosis of Wound #2R is a Diabetic Wound/Ulcer of the Lower Extremity located on the Left Metatarsal head first .Severity of Tissue Pre Debridement is: Fat layer exposed. There was a Excisional Skin/Subcutaneous Tissue Debridement with a total area of 0.5 sq cm performed by Kalman Shan, DO.  With the following instrument(s): Curette to remove Viable and Non-Viable tissue/material. Material removed includes Callus, Subcutaneous Tissue, Skin: Dermis, Skin: Epidermis, and Fibrin/Exudate after achieving pain control using Lidocaine 4% Topical Solution. A time out was conducted at 12:55, prior to the start of the procedure. A Minimum amount of bleeding was controlled with Pressure. The procedure was tolerated well with a pain level of 0 throughout and a pain level of 0 following the procedure. Post Debridement Measurements: 0.6cm length x 0.3cm width x 0.2cm depth; 0.028cm^3 volume. Character of Wound/Ulcer Post Debridement is improved. Severity of Tissue Post Debridement is: Fat layer exposed. Post procedure Diagnosis Wound #2R: Same as Pre-Procedure Plan  Follow-up Appointments: Return Appointment in 2 weeks. - Dr. He[place] Tuesday Bathing/ Shower/ Hygiene: May shower and wash wound with soap and water. - WITH DRESSING CHANGES. Edema Control - Lymphedema / SCD / Other: Elevate legs to the level of the heart or above for 30 minutes daily and/or when sitting, a frequency of: Avoid standing for long periods of time. WOUND #2R: - Metatarsal head first Wound Laterality: Left Cleanser: Soap and Water 1 x Per Day/15 Days Discharge Instructions: May shower and wash wound with dial antibacterial soap and water prior to dressing change. Cleanser: Wound Cleanser (Generic) 1 x Per Day/15 Days Discharge Instructions: Cleanse the wound with wound cleanser prior to applying a clean dressing using gauze sponges, not tissue or cotton balls. Peri-Wound Care: Sween Lotion (Moisturizing lotion) 1 x Per Day/15 Days Discharge Instructions: Apply moisturizing lotion as directed Prim Dressing: KerraCel Ag Gelling Fiber Dressing, 4x5 in (silver alginate) 1 x Per Day/15 Days ary Discharge Instructions: Apply silver alginate to wound bed as instructed Secondary Dressing: Woven Gauze Sponges 2x2 in (Generic) 1 x  Per Day/15 Days Discharge Instructions: Apply over primary dressing as directed. Secondary Dressing: Optifoam Non-Adhesive Dressing, 4x4 in (Generic) 1 x Per Day/15 Days Discharge Instructions: Apply over primary dressing cut to make foam donut to help offload Secured With: Conforming Stretch Gauze Bandage, Sterile 2x75 (in/in) (Generic) 1 x Per Day/15 Days Discharge Instructions: Secure with stretch gauze as directed. Secured With: 48M Medipore H Soft Cloth Surgical T ape, 2x2 (in/yd) (Generic) 1 x Per Day/15 Days Discharge Instructions: Secure dressing with tape as directed. WOUND #3: - Knee Wound Laterality: Right, Posterior Cleanser: Soap and Water 1 x Per BOF/75 Days Discharge Instructions: May shower and wash wound with dial antibacterial soap and water prior to dressing change. Cleanser: Wound Cleanser (Generic) 1 x Per Day/15 Days Discharge Instructions: Cleanse the wound with wound cleanser prior to applying a clean dressing using gauze sponges, not tissue or cotton balls. Topical: Triple Antibiotic Ointment, 1 (oz) Tube 1 x Per Day/15 Days Secondary Dressing: Zetuvit Plus Silicone Border Dressing 4x4 (in/in) 1 x Per Day/15 Days Discharge Instructions: Apply silicone border over primary dressing as directed. 1. In office sharp debridement 2. Antibiotic ointment to the right knee wound and silver alginate to the left plantar wound 3. Aggressive offloading Electronic Signature(s) Signed: 05/13/2021 2:19:10 PM By: Kalman Shan DO Entered By: Kalman Shan on 05/13/2021 14:17:56 -------------------------------------------------------------------------------- HxROS Details Patient Name: Date of Service: [name], [name] 05/13/2021 12:30 PM Medical Record Number: [id number] Patient Account Number: [id number] Date of Birth/Sex: Treating RN: September 05, 1971 (50 y.o. [name] Primary Care Provider: [name] Other Clinician: Referring Provider: Treating  Provider/Extender: Delano Metz in Treatment: 18 Information Obtained From Patient Constitutional Symptoms (General Health) Medical History: Past Medical History Notes: falls , leukocytosis Eyes Medical History: Negative for: Cataracts; Glaucoma; Optic Neuritis Ear/Nose/Mouth/Throat Medical History: Negative for: Chronic sinus problems/congestion; Middle ear problems Hematologic/Lymphatic Medical History: Positive for: Anemia Negative for: Hemophilia; Human Immunodeficiency Virus; Lymphedema; Sickle Cell Disease Respiratory Medical History: Negative for: Aspiration; Asthma; Chronic Obstructive Pulmonary Disease (COPD); Pneumothorax; Sleep Apnea; Tuberculosis Past Medical History Notes: During waking hours and catches himself not breathing, "gasp for air". Saw Dr Wynonia Lawman, he couldn't find a reason Cardiovascular Medical History: Positive for: Hypertension; Peripheral Venous Disease Negative for: Angina; Arrhythmia; Congestive Heart Failure; Coronary Artery Disease; Deep Vein Thrombosis; Hypotension; Myocardial Infarction; Peripheral Arterial Disease; Phlebitis; Vasculitis Gastrointestinal Medical History: Negative for: Cirrhosis ; Colitis; Crohns; Hepatitis  A; Hepatitis B; Hepatitis C Endocrine Medical History: Positive for: Type II Diabetes - oral meds Negative for: Type I Diabetes Time with diabetes: 2014 Treated with: Oral agents Blood sugar tested every day: Yes Tested : Blood sugar testing results: Breakfast: 91 Genitourinary Medical History: Negative for: End Stage Renal Disease Immunological Medical History: Negative for: Lupus Erythematosus; Raynauds; Scleroderma Integumentary (Skin) Medical History: Negative for: History of Burn Musculoskeletal Medical History: Negative for: Gout; Rheumatoid Arthritis; Osteoarthritis; Osteomyelitis Neurologic Medical History: Negative for: Dementia; Neuropathy; Quadriplegia; Paraplegia; Seizure  Disorder Oncologic Medical History: Negative for: Received Chemotherapy; Received Radiation Psychiatric Medical History: Negative for: Anorexia/bulimia; Confinement Anxiety Immunizations Pneumococcal Vaccine: Received Pneumococcal Vaccination: No Implantable Devices No devices added Hospitalization / Surgery History Type of Hospitalization/Surgery fever 105, sepsis cellulitis right popliteal fossa 04/09/2021 Family and Social History Cancer: No; Diabetes: Yes - Maternal Grandparents,Mother; Heart Disease: Yes - Father; Hereditary Spherocytosis: No; Hypertension: Yes - Father; Kidney Disease: No; Lung Disease: No; Seizures: No; Stroke: No; Thyroid Problems: No; Tuberculosis: No; Current every day smoker; Marital Status - Single; Alcohol Use: Never; Drug Use: No History; Caffeine Use: Never; Financial Concerns: No; Food, Clothing or Shelter Needs: No; Support System Lacking: No; Transportation Concerns: No Electronic Signature(s) Signed: 05/13/2021 2:19:10 PM By: Kalman Shan DO Signed: 05/13/2021 4:54:56 PM By: Deon Pilling Entered By: Kalman Shan on 05/13/2021 14:13:35 -------------------------------------------------------------------------------- SuperBill Details Patient Name: Date of Service: [name], [name] 05/13/2021 Medical Record Number: [id number] Patient Account Number: [id number] Date of Birth/Sex: Treating RN: 24-Jan-1971 (50 y.o. [name], [name] Primary Care Provider: [name] Other Clinician: Referring Provider: Treating Provider/Extender: Delano Metz in Treatment: 38 Diagnosis Coding ICD-10 Codes Code Description E11.621 Type 2 diabetes mellitus with foot ulcer S81.801D Unspecified open wound, right lower leg, subsequent encounter L97.522 Non-pressure chronic ulcer of other part of left foot with fat layer exposed E11.42 Type 2 diabetes mellitus with diabetic polyneuropathy Z89.511 Acquired absence of right leg  below knee Facility Procedures CPT4 Code: [id number] Description: 50518 - DEB SUBQ TISSUE 20 SQ CM/< ICD-10 Diagnosis Description L97.522 Non-pressure chronic ulcer of other part of left foot with fat layer exposed Modifier: Quantity: 1 Physician Procedures : CPT4 Code Description Modifier [id number] - WC PHYS SUBQ TISS 20 SQ CM ICD-10 Diagnosis Description L97.522 Non-pressure chronic ulcer of other part of left foot with fat layer exposed Quantity: 1 Electronic Signature(s) Signed: 05/13/2021 2:19:10 PM By: Kalman Shan DO Entered By: Kalman Shan on 05/13/2021 14:18:04

## 2021-05-15 NOTE — Progress Notes (Signed)
[name] [name] ([id number]) Visit Report for 05/13/2021 Arrival Information Details Patient Name: Date of Service: [name] [name] 05/13/2021 12:30 PM Medical Record Number: [id number] Patient Account Number: [id number] Date of Birth/Sex: Treating RN: October 06, 1971 (50 y.o. [name] [name], [name] Primary Care Asalee Barrette: [name] Other Clinician: Referring Mitchel Delduca: Treating Damilola Flamm/Extender: Delano Metz in Treatment: 42 Visit Information History Since Last Visit Added or deleted any medications: No Patient Arrived: Ambulatory Any new allergies or adverse reactions: No Arrival Time: 12:40 Had a fall or experienced change in No Accompanied By: self activities of daily living that may affect Transfer Assistance: None risk of falls: Patient Identification Verified: Yes Signs or symptoms of abuse/neglect since last visito No Secondary Verification Process Completed: Yes Hospitalized since last visit: No Patient Requires Transmission-Based Precautions: No Implantable device outside of the clinic excluding No Patient Has Alerts: No cellular tissue based products placed in the center since last visit: Has Dressing in Place as Prescribed: Yes Pain Present Now: No Notes Per patient had a diagnostic test yesterday related to vomiting blood. GI MD ordered. Electronic Signature(s) Signed: 05/13/2021 4:54:56 PM By: Deon Pilling Entered By: Deon Pilling on 05/13/2021 12:49:01 -------------------------------------------------------------------------------- Encounter Discharge Information Details Patient Name: Date of Service: [name] Shire ND, [name] 05/13/2021 12:30 PM Medical Record Number: [id number] Patient Account Number: [id number] Date of Birth/Sex: Treating RN: 12-20-1970 (50 y.o. Gilman Buttner Primary Care Charlesetta Milliron: [name] Other Clinician: Referring Tereka Thorley: Treating Sharifa Bucholz/Extender: Delano Metz in  Treatment: 3 Encounter Discharge Information Items Post Procedure Vitals Discharge Condition: Stable Temperature (F): 99.1 Ambulatory Status: Ambulatory Pulse (bpm): 91 Discharge Destination: Home Respiratory Rate (breaths/min): 18 Transportation: Private Auto Blood Pressure (mmHg): 128/77 Accompanied By: alone Schedule Follow-up Appointment: No Clinical Summary of Care: Electronic Signature(s) Signed: 05/15/2021 8:38:46 AM By: Leane Call Entered By: Leane Call on 05/13/2021 14:22:54 -------------------------------------------------------------------------------- Lower Extremity Assessment Details Patient Name: Date of Service: [name], [name] Brandy [name] 05/13/2021 12:30 PM Medical Record Number: [id number] Patient Account Number: [id number] Date of Birth/Sex: Treating RN: June 05, 1971 (50 y.o. [name] [name] Primary Care Letizia Hook: [name] Other Clinician: Referring Saraann Enneking: Treating Ronda Rajkumar/Extender: Delano Metz in Treatment: 38 Edema Assessment Assessed: [name]: Yes] [name]: No] Edema: [Left: Ye] [Right: s] Calf Left: Right: Point of Measurement: 50 cm From Medial Instep 44 cm Ankle Left: Right: Point of Measurement: 11 cm From Medial Instep 30 cm Vascular Assessment Pulses: Dorsalis Pedis Palpable: [Left:Yes] Electronic Signature(s) Signed: 05/13/2021 4:54:56 PM By: Deon Pilling Entered By: Deon Pilling on 05/13/2021 12:49:37 -------------------------------------------------------------------------------- Multi Wound Chart Details Patient Name: Date of Service: [name] Shire ND, [name] 05/13/2021 12:30 PM Medical Record Number: [id number] Patient Account Number: [id number] Date of Birth/Sex: Treating RN: Nov 21, 1971 (50 y.o. [name] [name] Primary Care Loucille Takach: [name] Other Clinician: Referring Wayman Hoard: Treating Rucha Wissinger/Extender: Delano Metz in Treatment: 53 Vital  Signs Height(in): 77 Pulse(bpm): 91 Weight(lbs): 232 Blood Pressure(mmHg): 128/77 Body Mass Index(BMI): 28 Temperature(F): 99.1 Respiratory Rate(breaths/min): 18 Photos: [2R:No Photos Left Metatarsal head first] [3:No Photos Right, Posterior Knee] [N/A:N/A N/A] Wound Location: [2R:Blister] [3:Pressure Injury] [N/A:N/A] Wounding Event: [2R:Diabetic Wound/Ulcer of the Lower] [3:Pressure Ulcer] [N/A:N/A] Primary Etiology: [2R:Extremity Anemia, Hypertension, Peripheral] [3:Anemia, Hypertension, Peripheral] [N/A:N/A] Comorbid History: [2R:Venous Disease, Type II Diabetes 06/23/2020] [3:Venous Disease, Type II Diabetes 03/31/2021] [N/A:N/A] Date Acquired: [2R:38] [3:5] [N/A:N/A] Weeks of Treatment: [2R:Open] [3:Open] [N/A:N/A] Wound Status: [2R:Yes] [3:No] [N/A:N/A] Wound Recurrence: [2R:No] [3:Yes] [N/A:N/A] Clustered Wound: [2R:N/A] [3:1] [N/A:N/A] Clustered Quantity: [2R:0.6x0.3x0.2] [3:1x0.4x0.1] [  N/A:N/A] Measurements L x W x D (cm) [2R:0.141] [3:0.314] [N/A:N/A] A (cm) : rea [2R:0.028] [3:0.031] [N/A:N/A] Volume (cm) : [2R:-354.80%] [3:80.70%] [N/A:N/A] % Reduction in A rea: [2R:-833.30%] [3:81.00%] [N/A:N/A] % Reduction in Volume: [2R:12] Starting Position 1 (o'clock): [2R:12] Ending Position 1 (o'clock): [2R:0.2] Maximum Distance 1 (cm): [2R:Yes] [3:No] [N/A:N/A] Undermining: [2R:Grade 2] [3:Unstageable/Unclassified] [N/A:N/A] Classification: [2R:Medium] [3:Medium] [N/A:N/A] Exudate A mount: [2R:Serosanguineous] [3:Serosanguineous] [N/A:N/A] Exudate Type: [2R:red, brown] [3:red, brown] [N/A:N/A] Exudate Color: [2R:Thickened] [3:Flat and Intact] [N/A:N/A] Wound Margin: [2R:Large (67-100%)] [3:Small (1-33%)] [N/A:N/A] Granulation A mount: [2R:Pink] [3:Pink, Pale] [N/A:N/A] Granulation Quality: [2R:None Present (0%)] [3:Large (67-100%)] [N/A:N/A] Necrotic A mount: [2R:Fat Layer (Subcutaneous Tissue): Yes Fat Layer (Subcutaneous Tissue): Yes N/A] Exposed Structures: [2R:Fascia:  No Tendon: No Muscle: No Joint: No Bone: No Large (67-100%)] [3:Fascia: No Tendon: No Muscle: No Joint: No Bone: No Small (1-33%)] [N/A:N/A] Epithelialization: [2R:Debridement - Excisional] [3:N/A] [N/A:N/A] Debridement: Pre-procedure Verification/Time Out 12:55 [3:N/A] [N/A:N/A] Taken: [2R:Lidocaine 4% Topical Solution] [3:N/A] [N/A:N/A] Pain Control: [2R:Callus, Subcutaneous] [3:N/A] [N/A:N/A] Tissue Debrided: [2R:Skin/Subcutaneous Tissue] [3:N/A] [N/A:N/A] Level: [2R:0.5] [3:N/A] [N/A:N/A] Debridement A (sq cm): [2R:rea Curette] [3:N/A] [N/A:N/A] Instrument: [2R:Minimum] [3:N/A] [N/A:N/A] Bleeding: [2R:Pressure] [3:N/A] [N/A:N/A] Hemostasis A chieved: [2R:0] [3:N/A] [N/A:N/A] Procedural Pain: [2R:0] [3:N/A] [N/A:N/A] Post Procedural Pain: [2R:Procedure was tolerated well] [3:N/A] [N/A:N/A] Debridement Treatment Response: [2R:0.6x0.3x0.2] [3:N/A] [N/A:N/A] Post Debridement Measurements L x W x D (cm) [2R:0.028] [3:N/A] [N/A:N/A] Post Debridement Volume: (cm) [2R:callous to periwound.] [3:N/A] [N/A:N/A] Assessment Notes: [2R:Debridement] [3:N/A] [N/A:N/A] Treatment Notes Electronic Signature(s) Signed: 05/13/2021 2:19:10 PM By: Kalman Shan DO Signed: 05/13/2021 4:54:56 PM By: Deon Pilling Entered By: Kalman Shan on 05/13/2021 14:12:32 -------------------------------------------------------------------------------- Multi-Disciplinary Care Plan Details Patient Name: Date of Service: [name] Shire ND, [name] 05/13/2021 12:30 PM Medical Record Number: [id number] Patient Account Number: [id number] Date of Birth/Sex: Treating RN: [birth year] (50 y.o. [name] [name] Primary Care Cindi Ghazarian: [name] Other Clinician: Referring Betania Dizon: Treating Chanell Nadeau/Extender: Delano Metz in Treatment: [place] reviewed with physician Active Inactive Wound/Skin Impairment Nursing Diagnoses: Impaired tissue  integrity Goals: Patient/caregiver will verbalize understanding of skin care regimen Date Initiated: 11/19/2020 Target Resolution Date: 06/13/2021 Goal Status: Active Ulcer/skin breakdown will have a volume reduction of 30% by week 4 Date Initiated: 08/16/2020 Date Inactivated: 03/03/2021 Target Resolution Date: 03/10/2021 Goal Status: Met Interventions: Provide education on ulcer and skin care Notes: Electronic Signature(s) Signed: 05/13/2021 4:54:56 PM By: Deon Pilling Entered By: Deon Pilling on 05/13/2021 12:51:46 -------------------------------------------------------------------------------- Pain Assessment Details Patient Name: Date of Service: [name] 05/13/2021 12:30 PM Medical Record Number: [id number] Patient Account Number: [id number] Date of Birth/Sex: Treating RN: [birth year] (50 y.o. [name] [name] Primary Care Londyn Wotton: [name] Other Clinician: Referring Joaquim Tolen: Treating Abcde Oneil/Extender: Delano Metz in Treatment: 81 Active Problems Location of Pain Severity and Description of Pain Patient Has Paino No Site Locations Rate the pain. Current Pain Level: 0 Pain Management and Medication Current Pain Management: Medication: No Cold Application: No Rest: No Massage: No Activity: No T.E.N.S.: No Heat Application: No Leg drop or elevation: No Is the Current Pain Management Adequate: Adequate How does your wound impact your activities of daily livingo Sleep: No Bathing: No Appetite: No Relationship With Others: No Bladder Continence: No Emotions: No Bowel Continence: No Work: No Toileting: No Drive: No Dressing: No Hobbies: No Electronic Signature(s) Signed: 05/13/2021 4:54:56 PM By: Deon Pilling Entered By: Deon Pilling on 05/13/2021 12:49:23 -------------------------------------------------------------------------------- Patient/Caregiver Education Details Patient Name: Date of  Service: [name], [name]  6/21/2022andnbsp12:30 PM Medical Record Number: [id number] Patient Account Number: [id number] Date of Birth/Gender: Treating RN: 1971/05/16 (50 y.o. [name] [name] Primary Care Physician: [name] Other Clinician: Referring Physician: Treating Physician/Extender: Delano Metz in Treatment: 63 Education Assessment Education Provided To: Patient Education Topics Provided Wound/Skin Impairment: Handouts: Skin Care Do's and Dont's Methods: Explain/Verbal Responses: Reinforcements needed Electronic Signature(s) Signed: 05/13/2021 4:54:56 PM By: Deon Pilling Entered By: Deon Pilling on 05/13/2021 12:52:37 -------------------------------------------------------------------------------- Wound Assessment Details Patient Name: Date of Service: [name] 05/13/2021 12:30 PM Medical Record Number: [id number] Patient Account Number: [id number] Date of Birth/Sex: Treating RN: 1971/06/13 (50 y.o. [name] [name], [name] Primary Care Aanchal Cope: [name] Other Clinician: Referring Jameriah Trotti: Treating Srihitha Tagliaferri/Extender: Delano Metz in Treatment: 38 Wound Status Wound Number: 2R Primary Diabetic Wound/Ulcer of the Lower Extremity Etiology: Wound Location: Left Metatarsal head first Wound Status: Open Wounding Event: Blister Comorbid Anemia, Hypertension, Peripheral Venous Disease, Type II Date Acquired: 06/23/2020 History: Diabetes Weeks Of Treatment: 38 Clustered Wound: No Photos Wound Measurements Length: (cm) 0.6 Width: (cm) 0.3 Depth: (cm) 0.2 Area: (cm) 0.141 Volume: (cm) 0.028 % Reduction in Area: -354.8% % Reduction in Volume: -833.3% Epithelialization: Large (67-100%) Tunneling: No Undermining: Yes Starting Position (o'clock): 12 Ending Position (o'clock): 12 Maximum Distance: (cm) 0.2 Wound Description Classification: Grade 2 Wound Margin: Thickened Exudate  Amount: Medium Exudate Type: Serosanguineous Exudate Color: red, brown Foul Odor After Cleansing: No Slough/Fibrino No Wound Bed Granulation Amount: Large (67-100%) Exposed Structure Granulation Quality: Pink Fascia Exposed: No Necrotic Amount: None Present (0%) Fat Layer (Subcutaneous Tissue) Exposed: Yes Tendon Exposed: No Muscle Exposed: No Joint Exposed: No Bone Exposed: No Assessment Notes callous to periwound. Treatment Notes Wound #2R (Metatarsal head first) Wound Laterality: Left Cleanser Soap and Water Discharge Instruction: May shower and wash wound with dial antibacterial soap and water prior to dressing change. Wound Cleanser Discharge Instruction: Cleanse the wound with wound cleanser prior to applying a clean dressing using gauze sponges, not tissue or cotton balls. Peri-Wound Care Sween Lotion (Moisturizing lotion) Discharge Instruction: Apply moisturizing lotion as directed Topical Primary Dressing KerraCel Ag Gelling Fiber Dressing, 4x5 in (silver alginate) Discharge Instruction: Apply silver alginate to wound bed as instructed Secondary Dressing Woven Gauze Sponges 2x2 in Discharge Instruction: Apply over primary dressing as directed. Optifoam Non-Adhesive Dressing, 4x4 in Discharge Instruction: Apply over primary dressing cut to make foam donut to help offload Secured With Conforming Stretch Gauze Bandage, Sterile 2x75 (in/in) Discharge Instruction: Secure with stretch gauze as directed. 81M Medipore H Soft Cloth Surgical Tape, 2x2 (in/yd) Discharge Instruction: Secure dressing with tape as directed. Compression Wrap Compression Stockings Add-Ons Electronic Signature(s) Signed: 05/13/2021 4:19:49 PM By: Sandre Kitty Signed: 05/13/2021 4:54:56 PM By: Deon Pilling Entered By: Sandre Kitty on 05/13/2021 15:36:10 -------------------------------------------------------------------------------- Wound Assessment Details Patient Name: Date of  Service: [name] Shire ND, [name] 05/13/2021 12:30 PM Medical Record Number: [id number] Patient Account Number: [id number] Date of Birth/Sex: Treating RN: 10/02/1971 (50 y.o. [name] [name], [name] Primary Care Laurent Cargile: [name] Other Clinician: Referring Natalye Kott: Treating Ajmal Kathan/Extender: Delano Metz in Treatment: 38 Wound Status Wound Number: 3 Primary Pressure Ulcer Etiology: Wound Location: Right, Posterior Knee Wound Status: Open Wounding Event: Pressure Injury Comorbid Anemia, Hypertension, Peripheral Venous Disease, Type II Date Acquired: 03/31/2021 History: Diabetes Weeks Of Treatment: 5 Clustered Wound: Yes Photos Wound Measurements Length: (cm) 1 Width: (cm) 0.4 Depth: (cm) 0.1 Clustered Quantity: 1 Area: (cm) 0.314 Volume: (cm) 0.031 % Reduction in  Area: 80.7% % Reduction in Volume: 81% Epithelialization: Small (1-33%) Tunneling: No Undermining: No Wound Description Classification: Unstageable/Unclassified Wound Margin: Flat and Intact Exudate Amount: Medium Exudate Type: Serosanguineous Exudate Color: red, brown Foul Odor After Cleansing: No Slough/Fibrino Yes Wound Bed Granulation Amount: Small (1-33%) Exposed Structure Granulation Quality: Pink, Pale Fascia Exposed: No Necrotic Amount: Large (67-100%) Fat Layer (Subcutaneous Tissue) Exposed: Yes Necrotic Quality: Adherent Slough Tendon Exposed: No Muscle Exposed: No Joint Exposed: No Bone Exposed: No Treatment Notes Wound #3 (Knee) Wound Laterality: Right, Posterior Cleanser Soap and Water Discharge Instruction: May shower and wash wound with dial antibacterial soap and water prior to dressing change. Wound Cleanser Discharge Instruction: Cleanse the wound with wound cleanser prior to applying a clean dressing using gauze sponges, not tissue or cotton balls. Peri-Wound Care Topical Triple Antibiotic Ointment, 1 (oz) Tube Primary Dressing Secondary  Dressing Zetuvit Plus Silicone Border Dressing 4x4 (in/in) Discharge Instruction: Apply silicone border over primary dressing as directed. Secured With Compression Wrap Compression Stockings Environmental education officer) Signed: 05/13/2021 4:19:49 PM By: Sandre Kitty Signed: 05/13/2021 4:54:56 PM By: Deon Pilling Entered By: Sandre Kitty on 05/13/2021 15:36:53 -------------------------------------------------------------------------------- Vitals Details Patient Name: Date of Service: [name], [name] 05/13/2021 12:30 PM Medical Record Number: [id number] Patient Account Number: [id number] Date of Birth/Sex: Treating RN: 1971-08-20 (50 y.o. [name] [name] Primary Care Nakeia Calvi: [name] Other Clinician: Referring Anaelle Dunton: Treating Claryssa Sandner/Extender: Delano Metz in Treatment: 26 Vital Signs Time Taken: 12:40 Temperature (F): 99.1 Height (in): 77 Pulse (bpm): 91 Weight (lbs): 232 Respiratory Rate (breaths/min): 18 Body Mass Index (BMI): 27.5 Blood Pressure (mmHg): 128/77 Reference Range: 80 - 120 mg / dl Electronic Signature(s) Signed: 05/13/2021 4:54:56 PM By: Deon Pilling Entered By: Deon Pilling on 05/13/2021 12:49:15

## 2021-05-27 ENCOUNTER — Ambulatory Visit: Payer: Medicare Other | Admitting: Endocrinology

## 2021-05-27 ENCOUNTER — Other Ambulatory Visit: Payer: Self-pay

## 2021-05-27 ENCOUNTER — Encounter (HOSPITAL_BASED_OUTPATIENT_CLINIC_OR_DEPARTMENT_OTHER): Payer: Medicare Other | Attending: Internal Medicine | Admitting: Internal Medicine

## 2021-05-27 ENCOUNTER — Ambulatory Visit (INDEPENDENT_AMBULATORY_CARE_PROVIDER_SITE_OTHER): Payer: Medicare Other | Admitting: Endocrinology

## 2021-05-27 VITALS — BP 160/94 | HR 91 | Ht 76.0 in | Wt 234.4 lb

## 2021-05-27 DIAGNOSIS — Z8249 Family history of ischemic heart disease and other diseases of the circulatory system: Secondary | ICD-10-CM | POA: Diagnosis not present

## 2021-05-27 DIAGNOSIS — X58XXXD Exposure to other specified factors, subsequent encounter: Secondary | ICD-10-CM | POA: Diagnosis not present

## 2021-05-27 DIAGNOSIS — Z833 Family history of diabetes mellitus: Secondary | ICD-10-CM | POA: Diagnosis not present

## 2021-05-27 DIAGNOSIS — F17200 Nicotine dependence, unspecified, uncomplicated: Secondary | ICD-10-CM | POA: Insufficient documentation

## 2021-05-27 DIAGNOSIS — E1151 Type 2 diabetes mellitus with diabetic peripheral angiopathy without gangrene: Secondary | ICD-10-CM | POA: Insufficient documentation

## 2021-05-27 DIAGNOSIS — Z89511 Acquired absence of right leg below knee: Secondary | ICD-10-CM | POA: Insufficient documentation

## 2021-05-27 DIAGNOSIS — I10 Essential (primary) hypertension: Secondary | ICD-10-CM | POA: Diagnosis not present

## 2021-05-27 DIAGNOSIS — E1165 Type 2 diabetes mellitus with hyperglycemia: Secondary | ICD-10-CM | POA: Diagnosis not present

## 2021-05-27 DIAGNOSIS — E1142 Type 2 diabetes mellitus with diabetic polyneuropathy: Secondary | ICD-10-CM | POA: Diagnosis not present

## 2021-05-27 DIAGNOSIS — E11621 Type 2 diabetes mellitus with foot ulcer: Secondary | ICD-10-CM | POA: Diagnosis present

## 2021-05-27 DIAGNOSIS — S81801D Unspecified open wound, right lower leg, subsequent encounter: Secondary | ICD-10-CM | POA: Insufficient documentation

## 2021-05-27 DIAGNOSIS — L97522 Non-pressure chronic ulcer of other part of left foot with fat layer exposed: Secondary | ICD-10-CM | POA: Diagnosis not present

## 2021-05-27 LAB — POCT GLYCOSYLATED HEMOGLOBIN (HGB A1C): Hemoglobin A1C: 8.1 % — AB (ref 4.0–5.6)

## 2021-05-27 MED ORDER — HUMULIN N KWIKPEN 100 UNIT/ML ~~LOC~~ SUPN: 5.0000 [IU] | PEN_INJECTOR | SUBCUTANEOUS | 11 refills | Status: DC

## 2021-05-27 NOTE — Patient Instructions (Addendum)
check your blood sugar 4 times a day.  vary the time of day when you check, between before the 3 meals, and at bedtime.  also check if you have symptoms of your blood sugar being too high or too low.  please keep a record of the readings and bring it to your next appointment here (or you can bring the meter itself).  You can write it on any piece of paper.  please call us sooner if your blood sugar goes below 70, or if most of your readings are over 200.   We will need to take this complex situation in stages.   Please stop taking the metformin, and: I have sent a prescription to your pharmacy, to start insulin, 5 units each morning, and:  Please continue the same other medications.   Please come back for a follow-up appointment in 2 months.

## 2021-05-27 NOTE — Progress Notes (Signed)
Subjective:    Patient ID: [name] [name], male    DOB: [birth year], 50 y.o.   MRN: [id number]  HPI Pt returns for f/u of diabetes mellitus:  DM type: 2 Dx'ed: 6314 Complications: stage 3 CRI, left foot ulcer, and right BKA (traumatic).  Therapy: Trulicity and 3 oral meds.   DKA: never Severe hypoglycemia: never Pancreatitis: never Pancreatic imaging: normal on 2020 CT.  SDOH: none Other: Trulicity dosage is limited by n/v.   Interval history: no cbg record, but states cbg's are in the 100's.  pt states he feels well in general. Past Medical History:  Diagnosis Date   Allergy    seasonal and environmental   ARF (acute renal failure) ([place]) 09/2017   Chronic constipation 05/13/2020   Depression    Diabetes mellitus without complication ([place]) 9702   Hyperlipidemia    Hypertension    Necrotizing fasciitis ([place]) 10/13/2017   Neuromuscular disorder ([place])    neuropathy   Wound dehiscence 11/24/2017    Past Surgical History:  Procedure Laterality Date   AMPUTATION Right 10/13/2017   Procedure: RIGHT BELOW KNEE AMPUTATION;  Surgeon: [name], MD;  Location: [facility];  Service: Orthopedics;  Laterality: Right;   AMPUTATION Right 11/24/2017   Procedure: AMPUTATION BELOW KNEE REVISION;  Surgeon: [name], MD;  Location: [facility];  Service: Orthopedics;  Laterality: Right;   COLONOSCOPY  05/11/2019   NO PAST SURGERIES     POLYPECTOMY     WISDOM TOOTH EXTRACTION      Social History   Socioeconomic History   Marital status: Divorced    Spouse name: Not on file   Number of children: 1   Years of education: 12   Highest education level: Associate degree: occupational, Hotel manager, or vocational program  Occupational History   Occupation: unemployed  Tobacco Use   Smoking status: Every Day    Packs/day: 0.25    Years: 35.00    Pack years: 8.75    Types: Cigarettes   Smokeless tobacco: Never   Tobacco comments:    stopped, but back on 1 cigarette daily.  Vaping Use    Vaping Use: Never used  Substance and Sexual Activity   Alcohol use: No   Drug use: No   Sexual activity: Not on file  Other Topics Concern   Not on file  Social History Narrative   Originally from  [place].   Is living with his mother, who essentially supports him now.   Social Determinants of Health   Financial Resource Strain: Not on file  Food Insecurity: Not on file  Transportation Needs: Not on file  Physical Activity: Not on file  Stress: Not on file  Social Connections: Not on file  Intimate Partner Violence: Not on file    Current Outpatient Medications on File Prior to Visit  Medication Sig Dispense Refill   AGAMATRIX ULTRA-THIN LANCETS MISC Check blood sugars before meals twice daily 100 each 11   amLODipine (NORVASC) 10 MG tablet Take 10 mg by mouth every morning.      ARIPiprazole (ABILIFY) 20 MG tablet Take 20 mg by mouth every morning.     atorvastatin (LIPITOR) 20 MG tablet Take 20 mg by mouth every morning.     baclofen (LIORESAL) 10 MG tablet 1/2 tab twice a day (Patient taking differently: Take 20 mg by mouth 2 (two) times daily.) 30 each 6   Blood Glucose Monitoring Suppl (AGAMATRIX PRESTO) w/Device KIT Check sugars twice daily before meals 1  kit 0   dapagliflozin propanediol (FARXIGA) 10 MG TABS tablet Take 10 mg by mouth daily.     desvenlafaxine (PRISTIQ) 100 MG 24 hr tablet Take 100 mg by mouth daily.     Dulaglutide (TRULICITY) 7.34 LP/3.7TK SOPN Inject 0.75 mg into the skin once a week. (Patient taking differently: Inject 0.75 mg into the skin once a week. Friday) 6 mL 3   DULoxetine (CYMBALTA) 60 MG capsule Take 60 mg by mouth 2 (two) times daily.      FLUoxetine (PROZAC) 40 MG capsule Take 40 mg by mouth every morning.     fluticasone (FLONASE) 50 MCG/ACT nasal spray Place 2 sprays into both nostrils daily as needed for allergies or rhinitis.      gabapentin (NEURONTIN) 300 MG capsule 1 cap by mouth in morning and midday, 2 caps by mouth at  bedtime (Patient taking differently: Take 300-600 mg by mouth See admin instructions. Take one capsule (300 mg) by mouth in the morning and afternoon, take two capsules (600 mg) at bedtime) 120 capsule 11   glipiZIDE (GLUCOTROL) 5 MG tablet 1/2 tab by mouth twice daily with meal (Patient taking differently: Take 5 mg by mouth daily. With dinner meal) 15 tablet 11   glucose blood (AGAMATRIX PRESTO TEST) test strip Check blood sugars twice daily before meals 100 each 12   lamoTRIgine (LAMICTAL) 100 MG tablet Take 100 mg by mouth 2 (two) times daily.     lisinopril (ZESTRIL) 20 MG tablet Take 1 tablet (20 mg total) by mouth daily.     metoprolol tartrate (LOPRESSOR) 25 MG tablet Take 25 mg by mouth 2 (two) times daily.     ondansetron (ZOFRAN) 8 MG tablet Take 8 mg by mouth every 8 (eight) hours as needed for nausea or vomiting.     pantoprazole (PROTONIX) 40 MG tablet Take 1 tablet (40 mg total) by mouth 2 (two) times daily. 60 tablet 2   Current Facility-Administered Medications on File Prior to Visit  Medication Dose Route Frequency Provider Last Rate Last Admin   0.9 %  sodium chloride infusion  500 mL Intravenous Once Doran Stabler, MD        Allergies  Allergen Reactions   Adhesive [Tape] Rash    Rash at site of tape--okay to use paper tape    Family History  Problem Relation Age of Onset   Diabetes Mellitus II Mother    Depression Mother    Colon polyps Mother    Heart disease Father        CABG x 4.  04/2017   Colon cancer Neg Hx    Esophageal cancer Neg Hx    Liver cancer Neg Hx    Pancreatic cancer Neg Hx    Rectal cancer Neg Hx    Stomach cancer Neg Hx     BP (!) 160/94 (BP Location: Right Arm, Patient Position: Sitting, Cuff Size: Normal)   Pulse 91   Ht _0  (1.93 m)   Wt 234 lb 6.4 oz (106.3 kg)   SpO2 97%   BMI 28.53 kg/m    Review of Systems Nausea is resolved.     Objective:   Physical Exam Left foot is wrapped (sees wound care) Right BKA   Lab  Results  Component Value Date   HGBA1C 8.1 (A) 05/27/2021   Lab Results  Component Value Date   CREATININE 2.25 (H) 04/23/2021   BUN 40 (H) 04/23/2021   NA 144 04/23/2021  K 4.2 04/23/2021   CL 105 04/23/2021   CO2 27 04/23/2021      Assessment & Plan:  Type 2 DM: uncontrolled.   Patient Instructions  check your blood sugar 4 times a day.  vary the time of day when you check, between before the 3 meals, and at bedtime.  also check if you have symptoms of your blood sugar being too high or too low.  please keep a record of the readings and bring it to your next appointment here (or you can bring the meter itself).  You can write it on any piece of paper.  please call us sooner if your blood sugar goes below 70, or if most of your readings are over 200.   We will need to take this complex situation in stages.   Please stop taking the metformin, and: I have sent a prescription to your pharmacy, to start insulin, 5 units each morning, and:  Please continue the same other medications.   Please come back for a follow-up appointment in 2 months.

## 2021-05-27 NOTE — Progress Notes (Signed)
PIUS, BYROM ([id number]) Visit Report for 05/27/2021 Arrival Information Details Patient Name: Date of Service: [name] [name] 05/27/2021 12:30 PM Medical Record Number: [id number] Patient Account Number: [id number] Date of Birth/Sex: Treating RN: 12-Nov-1971 (50 y.o. [name] Primary Care : [name] Other Clinician: Referring : Treating /Extender: Delano Metz in Treatment: 58 Visit Information History Since Last Visit Added or deleted any medications: No Patient Arrived: Ambulatory Any new allergies or adverse reactions: No Arrival Time: 13:12 Had a fall or experienced change in No Accompanied By: alone activities of daily living that may affect Transfer Assistance: None risk of falls: Patient Identification Verified: Yes Signs or symptoms of abuse/neglect since last visito No Secondary Verification Process Completed: Yes Hospitalized since last visit: No Patient Requires Transmission-Based Precautions: No Implantable device outside of the clinic excluding No Patient Has Alerts: No cellular tissue based products placed in the center since last visit: Has Dressing in Place as Prescribed: No Pain Present Now: No Electronic Signature(s) Signed: 05/27/2021 7:10:28 PM By: Levan Hurst RN, BSN Entered By: Levan Hurst on 05/27/2021 13:12:19 -------------------------------------------------------------------------------- Encounter Discharge Information Details Patient Name: Date of Service: [name] [name], [name] 05/27/2021 12:30 PM Medical Record Number: [id number] Patient Account Number: [id number] Date of Birth/Sex: Treating RN: Mar 05, 1971 (50 y.o. [name] Primary Care : [name] Other Clinician: Referring : Treating /Extender: Delano Metz in Treatment: 85 Encounter Discharge Information Items Post Procedure Vitals Discharge  Condition: Stable Temperature (F): 98.9 Ambulatory Status: Ambulatory Pulse (bpm): 89 Discharge Destination: Home Respiratory Rate (breaths/min): 16 Transportation: Private Auto Blood Pressure (mmHg): 130/79 Accompanied By: self Schedule Follow-up Appointment: Yes Clinical Summary of Care: Electronic Signature(s) Signed: 05/27/2021 6:55:28 PM By: Deon Pilling Entered By: Deon Pilling on 05/27/2021 18:25:30 -------------------------------------------------------------------------------- Lower Extremity Assessment Details Patient Name: Date of Service: [name] Arrow [name] 05/27/2021 12:30 PM Medical Record Number: [id number] Patient Account Number: [id number] Date of Birth/Sex: Treating RN: 11-10-1971 (50 y.o. [name] Primary Care : [name] Other Clinician: Referring : Treating /Extender: Delano Metz in Treatment: 40 Edema Assessment Assessed: [name]: No] [name]: No] Edema: [Left: Ye] [Right: s] Calf Left: Right: Point of Measurement: 50 cm From Medial Instep 41.5 cm Ankle Left: Right: Point of Measurement: 11 cm From Medial Instep 27 cm Vascular Assessment Pulses: Dorsalis Pedis Palpable: [Left:Yes] Electronic Signature(s) Signed: 05/27/2021 7:10:28 PM By: Levan Hurst RN, BSN Entered By: Levan Hurst on 05/27/2021 13:16:55 -------------------------------------------------------------------------------- Multi Wound Chart Details Patient Name: Date of Service: [name] [name], [name] 05/27/2021 12:30 PM Medical Record Number: [id number] Patient Account Number: [id number] Date of Birth/Sex: Treating RN: Oct 16, 1971 (50 y.o. [name], [name] Primary Care : [name] Other Clinician: Referring : Treating /Extender: Delano Metz in Treatment: 40 Vital Signs Height(in): 77 Capillary Blood Glucose(mg/dl): 111 Weight(lbs): 232 Pulse(bpm):  34 Body Mass Index(BMI): 28 Blood Pressure(mmHg): 130/79 Temperature(F): 98.9 Respiratory Rate(breaths/min): 16 Photos: [2R:No Photos Left Metatarsal head first] [3:No Photos Right, Posterior Knee] [N/A:N/A N/A] Wound Location: [2R:Blister] [3:Pressure Injury] [N/A:N/A] Wounding Event: [2R:Diabetic Wound/Ulcer of the Lower] [3:Pressure Ulcer] [N/A:N/A] Primary Etiology: [2R:Extremity Anemia, Hypertension, Peripheral] [3:Anemia, Hypertension, Peripheral] [N/A:N/A] Comorbid History: [2R:Venous Disease, Type II Diabetes 06/23/2020] [3:Venous Disease, Type II Diabetes 03/31/2021] [N/A:N/A] Date Acquired: [2R:40] [3:7] [N/A:N/A] Weeks of Treatment: [2R:Open] [3:Open] [N/A:N/A] Wound Status: [2R:Yes] [3:No] [N/A:N/A] Wound Recurrence: [2R:No] [3:Yes] [N/A:N/A] Clustered Wound: [2R:N/A] [3:1] [N/A:N/A] Clustered Quantity: [2R:0.4x0.5x0.2] [3:1.1x0.5x0.1] [N/A:N/A] Measurements L x W x D (  cm) [2R:0.157] [3:0.432] [N/A:N/A] A (cm) : rea [2R:0.031] [3:0.043] [N/A:N/A] Volume (cm) : [2R:-406.50%] [3:73.40%] [N/A:N/A] % Reduction in A rea: [2R:-933.30%] [3:73.60%] [N/A:N/A] % Reduction in Volume: [2R:12] Starting Position 1 (o'clock): [2R:11] Ending Position 1 (o'clock): [2R:0.4] Maximum Distance 1 (cm): [2R:Yes] [3:No] [N/A:N/A] Undermining: [2R:Grade 2] [3:Unstageable/Unclassified] [N/A:N/A] Classification: [2R:Medium] [3:Medium] [N/A:N/A] Exudate A mount: [2R:Serosanguineous] [3:Serosanguineous] [N/A:N/A] Exudate Type: [2R:red, brown] [3:red, brown] [N/A:N/A] Exudate Color: [2R:Thickened] [3:Flat and Intact] [N/A:N/A] Wound Margin: [2R:Large (67-100%)] [3:Large (67-100%)] [N/A:N/A] Granulation A mount: [2R:Pink] [3:Pink, Pale] [N/A:N/A] Granulation Quality: [2R:None Present (0%)] [3:Small (1-33%)] [N/A:N/A] Necrotic A mount: [2R:Fat Layer (Subcutaneous Tissue): Yes Fat Layer (Subcutaneous Tissue): Yes N/A] Exposed Structures: [2R:Fascia: No Tendon: No Muscle: No Joint: No Bone: No Large  (67-100%)] [3:Fascia: No Tendon: No Muscle: No Joint: No Bone: No Small (1-33%)] [N/A:N/A] Epithelialization: [2R:Debridement - Excisional] [3:N/A] [N/A:N/A] Debridement: Pre-procedure Verification/Time Out 13:30 [3:N/A] [N/A:N/A] Taken: [2R:Lidocaine 4% Topical Solution] [3:N/A] [N/A:N/A] Pain Control: [2R:Callus, Subcutaneous, Slough] [3:N/A] [N/A:N/A] Tissue Debrided: [2R:Skin/Subcutaneous Tissue] [3:N/A] [N/A:N/A] Level: [2R:1] [3:N/A] [N/A:N/A] Debridement A (sq cm): [2R:rea Curette] [3:N/A] [N/A:N/A] Instrument: [2R:Large] [3:N/A] [N/A:N/A] Bleeding: [2R:Pressure] [3:N/A] [N/A:N/A] Hemostasis A chieved: [2R:0] [3:N/A] [N/A:N/A] Procedural Pain: [2R:1] [3:N/A] [N/A:N/A] Post Procedural Pain: [2R:Procedure was tolerated well] [3:N/A] [N/A:N/A] Debridement Treatment Response: [2R:0.4x0.5x0.2] [3:N/A] [N/A:N/A] Post Debridement Measurements L x W x D (cm) [2R:0.031] [3:N/A] [N/A:N/A] Post Debridement Volume: (cm) [2R:Debridement] [3:N/A] [N/A:N/A] Treatment Notes Electronic Signature(s) Signed: 05/27/2021 1:43:34 PM By: Kalman Shan DO Signed: 05/27/2021 6:55:28 PM By: Deon Pilling Entered By: Kalman Shan on 05/27/2021 13:38:54 -------------------------------------------------------------------------------- Multi-Disciplinary Care Plan Details Patient Name: Date of Service: [name] [name], [name] 05/27/2021 12:30 PM Medical Record Number: [id number] Patient Account Number: [id number] Date of Birth/Sex: Treating RN: 11-Oct-1971 (50 y.o. [name] Primary Care Alexandros Ewan: [name] Other Clinician: Referring Tannen Vandezande: Treating Kion Huntsberry/Extender: Delano Metz in Treatment: [place] reviewed with physician Active Inactive Wound/Skin Impairment Nursing Diagnoses: Impaired tissue integrity Goals: Patient/caregiver will verbalize understanding of skin care regimen Date Initiated: 11/19/2020 Target Resolution Date:  06/13/2021 Goal Status: Active Ulcer/skin breakdown will have a volume reduction of 30% by week 4 Date Initiated: 08/16/2020 Date Inactivated: 03/03/2021 Target Resolution Date: 03/10/2021 Goal Status: Met Interventions: Provide education on ulcer and skin care Notes: Electronic Signature(s) Signed: 05/27/2021 6:55:28 PM By: Deon Pilling Entered By: Deon Pilling on 05/27/2021 12:29:18 -------------------------------------------------------------------------------- Pain Assessment Details Patient Name: Date of Service: [name] [name], [name] 05/27/2021 12:30 PM Medical Record Number: [id number] Patient Account Number: [id number] Date of Birth/Sex: Treating RN: 08-20-1971 (50 y.o. [name] Primary Care Brandin Stetzer: [name] Other Clinician: Referring Davion Flannery: Treating Wasif Simonich/Extender: Delano Metz in Treatment: 40 Active Problems Location of Pain Severity and Description of Pain Patient Has Paino No Site Locations Pain Management and Medication Current Pain Management: Electronic Signature(s) Signed: 05/27/2021 7:10:28 PM By: Levan Hurst RN, BSN Entered By: Levan Hurst on 05/27/2021 13:12:49 -------------------------------------------------------------------------------- Patient/Caregiver Education Details Patient Name: Date of Service: [name] [name] 7/5/2022andnbsp12:30 PM Medical Record Number: [id number] Patient Account Number: [id number] Date of Birth/Gender: Treating RN: 22-Nov-1971 (50 y.o. [name] Primary Care Physician: [name] Other Clinician: Referring Physician: Treating Physician/Extender: Delano Metz in Treatment: 20 Education Assessment Education Provided To: Patient Education Topics Provided Wound/Skin Impairment: Handouts: Skin Care Do's and Dont's Methods: Explain/Verbal Responses: Reinforcements needed Electronic Signature(s) Signed: 05/27/2021 6:55:28 PM  By: Deon Pilling Entered By: Deon Pilling on 05/27/2021 13:33:24 -------------------------------------------------------------------------------- Wound Assessment Details Patient Name:  Date of Service: [name] [name] 05/27/2021 12:30 PM Medical Record Number: [id number] Patient Account Number: [id number] Date of Birth/Sex: Treating RN: 05/28/1971 (50 y.o. [name] Primary Care : [name] Other Clinician: Referring : Treating /Extender: Delano Metz in Treatment: 40 Wound Status Wound Number: 2R Primary Diabetic Wound/Ulcer of the Lower Extremity Etiology: Wound Location: Left Metatarsal head first Wound Status: Open Wounding Event: Blister Comorbid Anemia, Hypertension, Peripheral Venous Disease, Type II Date Acquired: 06/23/2020 History: Diabetes Weeks Of Treatment: 40 Clustered Wound: No Photos Wound Measurements Length: (cm) 0.4 Width: (cm) 0.5 Depth: (cm) 0.2 Area: (cm) 0.157 Volume: (cm) 0.031 Wound Description Classification: Grade 2 Wound Margin: Thickened Exudate Amount: Medium Exudate Type: Serosanguineous Exudate Color: red, brown Foul Odor After Cleansing: Slough/Fibrino % Reduction in Area: -406.5% % Reduction in Volume: -933.3% Epithelialization: Large (67-100%) Undermining: Yes Starting Position (o'clock): 12 Ending Position (o'clock): 11 Maximum Distance: (cm) 0.4 No No Wound Bed Granulation Amount: Large (67-100%) Exposed Structure Granulation Quality: Pink Fascia Exposed: No Necrotic Amount: None Present (0%) Fat Layer (Subcutaneous Tissue) Exposed: Yes Tendon Exposed: No Muscle Exposed: No Joint Exposed: No Bone Exposed: No Treatment Notes Wound #2R (Metatarsal head first) Wound Laterality: Left Cleanser Soap and Water Discharge Instruction: May shower and wash wound with dial antibacterial soap and water prior to dressing change. Wound Cleanser Discharge  Instruction: Cleanse the wound with wound cleanser prior to applying a clean dressing using gauze sponges, not tissue or cotton balls. Peri-Wound Care Sween Lotion (Moisturizing lotion) Discharge Instruction: Apply moisturizing lotion as directed Topical Primary Dressing KerraCel Ag Gelling Fiber Dressing, 4x5 in (silver alginate) Discharge Instruction: Apply silver alginate to wound bed as instructed Secondary Dressing Woven Gauze Sponges 2x2 in Discharge Instruction: Apply over primary dressing as directed. Optifoam Non-Adhesive Dressing, 4x4 in Discharge Instruction: Apply over primary dressing cut to make foam donut to help offload Secured With Conforming Stretch Gauze Bandage, Sterile 2x75 (in/in) Discharge Instruction: Secure with stretch gauze as directed. 59M Medipore H Soft Cloth Surgical T ape, 2x2 (in/yd) Discharge Instruction: Secure dressing with tape as directed. Compression Wrap Compression Stockings Add-Ons Electronic Signature(s) Signed: 05/27/2021 5:48:24 PM By: Sandre Kitty Signed: 05/27/2021 7:10:28 PM By: Levan Hurst RN, BSN Entered By: Sandre Kitty on 05/27/2021 17:34:38 -------------------------------------------------------------------------------- Wound Assessment Details Patient Name: Date of Service: [name] [name], [name] 05/27/2021 12:30 PM Medical Record Number: [id number] Patient Account Number: [id number] Date of Birth/Sex: Treating RN: 26-Feb-1971 (50 y.o. [name] Primary Care : [name] Other Clinician: Referring : Treating /Extender: Delano Metz in Treatment: 40 Wound Status Wound Number: 3 Primary Pressure Ulcer Etiology: Wound Location: Right, Posterior Knee Wound Status: Open Wounding Event: Pressure Injury Comorbid Anemia, Hypertension, Peripheral Venous Disease, Type II Date Acquired: 03/31/2021 History: Diabetes Weeks Of Treatment: 7 Clustered Wound:  Yes Photos Wound Measurements Length: (cm) 1.1 Width: (cm) 0.5 Depth: (cm) 0.1 Clustered Quantity: 1 Area: (cm) 0.432 Volume: (cm) 0.043 % Reduction in Area: 73.4% % Reduction in Volume: 73.6% Epithelialization: Small (1-33%) Tunneling: No Undermining: No Wound Description Classification: Unstageable/Unclassified Wound Margin: Flat and Intact Exudate Amount: Medium Exudate Type: Serosanguineous Exudate Color: red, brown Foul Odor After Cleansing: No Slough/Fibrino Yes Wound Bed Granulation Amount: Large (67-100%) Exposed Structure Granulation Quality: Pink, Pale Fascia Exposed: No Necrotic Amount: Small (1-33%) Fat Layer (Subcutaneous Tissue) Exposed: Yes Necrotic Quality: Adherent Slough Tendon Exposed: No Muscle Exposed: No Joint Exposed: No Bone Exposed: No Treatment Notes Wound #3 (Knee) Wound Laterality:  Right, Posterior Cleanser Soap and Water Discharge Instruction: May shower and wash wound with dial antibacterial soap and water prior to dressing change. Wound Cleanser Discharge Instruction: Cleanse the wound with wound cleanser prior to applying a clean dressing using gauze sponges, not tissue or cotton balls. Peri-Wound Care Topical Triple Antibiotic Ointment, 1 (oz) Tube Primary Dressing Secondary Dressing Zetuvit Plus Silicone Border Dressing 4x4 (in/in) Discharge Instruction: Apply silicone border over primary dressing as directed. Secured With Compression Wrap Compression Stockings Environmental education officer) Signed: 05/27/2021 5:48:24 PM By: Sandre Kitty Signed: 05/27/2021 7:10:28 PM By: Levan Hurst RN, BSN Entered By: Sandre Kitty on 05/27/2021 17:34:58 -------------------------------------------------------------------------------- Vitals Details Patient Name: Date of Service: [name] [name] [name], [name] 05/27/2021 12:30 PM Medical Record Number: [id number] Patient Account Number: [id number] Date of Birth/Sex: Treating RN: [birth year]  (50 y.o. [name] Primary Care : [name] Other Clinician: Referring : Treating /Extender: Delano Metz in Treatment: 40 Vital Signs Time Taken: 13:13 Temperature (F): 98.9 Height (in): 77 Pulse (bpm): 89 Weight (lbs): 232 Respiratory Rate (breaths/min): 16 Body Mass Index (BMI): 27.5 Blood Pressure (mmHg): 130/79 Capillary Blood Glucose (mg/dl): 111 Reference Range: 80 - 120 mg / dl Notes glucose per pt report Electronic Signature(s) Signed: 05/27/2021 7:10:28 PM By: Levan Hurst RN, BSN Entered By: Levan Hurst on 05/27/2021 13:12:43

## 2021-05-27 NOTE — Progress Notes (Signed)
MICKY, SHELLER ([id number]) Visit Report for 05/27/2021 Chief Complaint Document Details Patient Name: Date of Service: [name] [name] 05/27/2021 12:30 PM Medical Record Number: [id number] Patient Account Number: [id number] Date of Birth/Sex: Treating RN: 23-Sep-1971 (50 y.o. [name] [name] Primary Care Provider: [name] Other Clinician: Referring Provider: Treating Provider/Extender: Delano Metz in Treatment: 17 Information Obtained from: Patient Chief Complaint patient is here for a review of the wound on his plantar left first metatarsal head and back of his right leg Electronic Signature(s) Signed: 05/27/2021 1:43:34 PM By: Kalman Shan DO Entered By: Kalman Shan on 05/27/2021 13:39:04 -------------------------------------------------------------------------------- Debridement Details Patient Name: Date of Service: [name] [name] [name], [name] 05/27/2021 12:30 PM Medical Record Number: [id number] Patient Account Number: [id number] Date of Birth/Sex: Treating RN: September 15, 1971 (50 y.o. [name] [name], [name] [name] Primary Care Provider: [name] Other Clinician: Referring Provider: Treating Provider/Extender: Delano Metz in Treatment: 40 Debridement Performed for Assessment: Wound #2R Left Metatarsal head first Performed By: Physician Kalman Shan, DO Debridement Type: Debridement Severity of Tissue Pre Debridement: Fat layer exposed Level of Consciousness (Pre-procedure): Awake and Alert Pre-procedure Verification/Time Out Yes - 13:30 Taken: Start Time: 13:31 Pain Control: Lidocaine 4% T opical Solution T Area Debrided (L x W): otal 1 (cm) x 1 (cm) = 1 (cm) Tissue and other material debrided: Viable, Non-Viable, Callus, Slough, Subcutaneous, Skin: Dermis , Skin: Epidermis, Fibrin/Exudate, Slough Level: Skin/Subcutaneous Tissue Debridement Description: Excisional Instrument: Curette Bleeding:  Large Hemostasis Achieved: Pressure End Time: 13:38 Procedural Pain: 0 Post Procedural Pain: 1 Response to Treatment: Procedure was tolerated well Level of Consciousness (Post- Awake and Alert procedure): Post Debridement Measurements of Total Wound Length: (cm) 0.4 Width: (cm) 0.5 Depth: (cm) 0.2 Volume: (cm) 0.031 Character of Wound/Ulcer Post Debridement: Improved Severity of Tissue Post Debridement: Fat layer exposed Post Procedure Diagnosis Same as Pre-procedure Electronic Signature(s) Signed: 05/27/2021 1:43:34 PM By: Kalman Shan DO Signed: 05/27/2021 6:55:28 PM By: Deon Pilling Entered By: Deon Pilling on 05/27/2021 13:38:20 -------------------------------------------------------------------------------- HPI Details Patient Name: Date of Service: [name] [name], [name] 05/27/2021 12:30 PM Medical Record Number: [id number] Patient Account Number: [id number] Date of Birth/Sex: Treating RN: 1971/09/13 (50 y.o. [name] [name] Primary Care Provider: [name] Other Clinician: Referring Provider: Treating Provider/Extender: Delano Metz in Treatment: 22 History of Present Illness HPI Description: 04/21/18 ADMISSION This is a 50 year old man who is a type II diabetic. In spite of fact his hemoglobin A1c is actually quite good 5.73 months ago he is at a really difficult time over the last 6-7 months. He developed a rapidly progressive infection in the right foot in November 2018 associated with osteomyelitis and necrotizing fasciitis. 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 The stump required and a revision on 11/24/17. The stump revision was advertised as being secondary to falls. I'm not sure the progressive history here however this area is actually closed over. The patient tells me over the same timeframe he has had wounds on the plantar aspect of his left foot. In the ED saw Dr. Sharol Given in April of this year. Noted that have Wagner grade 1 diabetic  ulcers on the fifth didn't first metatarsal heads. It is really not clear that this patient is been dressing this with anything. He came in with the clinic without any specific dressing on the wound areas using his own tennis shoe. He tells me that he only ambulates of course to do a pivot transfer. He  does not have his prosthesis for the right leg as of yet and he blames Medicaid for this. He does however use the foot to push himself along in his wheelchair at home. The patient has not had formal arterial studies. ABI in our clinic on the left was 1.13. Patient's past medical history includes type 2 diabetes, fracture of the right fibula. I see that he was treated for abscesses on his right buttock and chest in 2016. I did not look at the microbiology of this. 04/28/18; patient comes back in the clinic today with the wound pretty much the same as when he came in here last week. Small opening lots of undermining relatively. He tells [place] that nothing is really been on this for 3 days in spite of the fact that we gave him enough to dress this easily especially such a small wound. He says he lives with his mother, she is not capable of assisting with this he is changing the dressings himself. 05/05/18; much better-looking wound today. Smaller. There is some undermining medially however that's only perhaps 2 mm. No surrounding erythema. We've been using silver collagen 05/12/18; small wound on the first metatarsal head. No undermining no surrounding erythema. We've been using silver collagen he has home health coming out to change 05/20/18 the wound on the first metatarsal head looks better. Covered in surface debris/callus nonviable tissue. Required debridement but post debridement this looks quite good. We've been using silver collagen. He has home health 05/27/18; first metatarsal head wound continues to improve. Just about completely closed. Still a lot of surface debridement callus. We've been using  silver collagen 06/06/18; the first metatarsal wound is completely healed over. There is still a lot of callus. I gently removed some of this just to make sure that there was no open area and there is not. The patient mentions to me that his left leg has felt like "lead" for about a week READMISSION 08/16/2020 This is a 50 year old man with type 2 diabetes. He returns to clinic today with a 1 month history of a blister and callus over the first metatarsal head. This is in exactly the same area as when he was here in 2019. 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 He has standard running shoes on the left foot to match the area in his prosthesis. He has only been covering this area with a Band-Aid has not really been specifically dressing this. ABI in our clinic on the left was 1.16. This is essentially stable from the value in 2019 10/1 the patient has a linear wound over the left first metatarsal head. Again not a lot of callus thick skin around this that I removed with a #3 curette. This is not go deep to bone or muscle it does not look to be infected. 10/8 first metatarsal head. The wound looks like it is closing however this is going to be a difficult area to offload in the future. He has a size 15 shoe and he says his shoes are years old. The inserts in these current shoes are totally useless and I have advised him to replace these. He may need a new pair of shoes and I have he got original prosthesis at [employer] [place] I have asked him to go back there. 11/9; the patient has not been here in more than a month. Not sure he was healed when he was here the last  time. I told him he would have to get new shoes and certainly offload the area over the left first metatarsal head. He has done nothing of the kind. He arrives back in clinic with the same old new balance sneakers. A very large separating callus over the first metatarsal head on the  left 11/16; he has purchased new shoes and has at least 2 insoles like I asked. The wound is measuring much smaller. He is using silver alginate he changes the dressing himself 11/30; wound is measurably smaller in length of about a half a centimeter. This is generally very little depth. We have been using silver alginate. He changes the dressing himself every second day. 12/14; wound is about the same size. Significant undermining medially relative to the size of the wound. We have been using silver alginate. There is not a way to offload this area as he walks with a prosthesis on the right leg 12/28; wound is about the same size. Again he has undermining medially. I am using polymen on this 12/02/2020; the wound looks smaller. Still a senescent edge. We have been using polymen 1/24; the wound is come down a few millimeters. Still a senescent edge. I have been using polymen 2/8; the wound is come down slightly. Still with slight undermining from 12-6 o'clock I have been using polymen partially to get offloading on this area which is opposed by his prosthesis on the other leg. 2/22 quite open improvement he still has a comma shaped wound on the left first metatarsal head. Some depth. Using polymen 3/14; he still has the same problem of a comma shaped area over the left first metatarsal head. This seems to have had more depth today at 0.6 cm. We have been using polymen. The patient changes this himself every second day. I explored the idea of a total contact cast on the left leg however this is his driving foot. He was not enamored with the idea of not being able to drive and states that he does not have anybody else to get him around 3/28; again he comes in with a smaller looking wound however there is depth and undermining requiring debridement. We have been using Hydrofera Blue, changed to silver collagen today. The patient is doing the dressing himself 4/11; patient presents today for follow-up  of his left diabetic foot wound. He denies any issues in the past 2 weeks. He is currently using silver collagen every other day with dressing changes. He does not use diabetic shoes or special inserts to his sneakers. He does not use felt pads for offloading. 4/19; patient presents for his 1 week follow-up. He denies any issues. He reports using Hydrofera Blue and has not been using silver collagen for dressing changes. He reports minimal drainage to the wound. He overall feels well. 5/3; patient presents for 2-week follow-up. He has been using silver collagen without issues. He has no complaints today and states he overall feels well. 5/17; patient presents for 2-week follow-up. He states that the sleeve is rubbing up against the back of his right leg and causing a wound. He states this happened a week ago and has not been dressing the wound. The plantar wound is stable and he has been using collagen every other day. 5/24; Patient was had a recent hospitalization for cellulitis of his right popliteal fossa. He was admitted and started on IV antibiotics. He does not recall the plantar wound dressed while inpatient. He feels a lot better today. He  was discharged on 5/21 on oral antibiotics and has not picked this up until today. He reports using collagen to the plantar wound since discharge. He is keeping the right posterior knee wound covered. He is getting a new sleeve for his prostatic on 5/26. He currently denies signs of infection. 6/1; patient presents for 1 week follow-up. He is still on doxycycline. He has not been dressing the back of the right knee wound. He states he is receiving his new prosthetic sleeve tomorrow. He states he is using silver alginate to the plantar foot wound. He currently denies signs of infection. He states he has not been offloading the left foot wound 6/7; patient presents for 1 week follow-up. He completes his last dose of doxycycline today. He finally received his  prosthetic sleeve for the right side and he states it feels much better. He has been using antibiotic ointment to the back of that right knee wound. He has been using silver alginate to the plantar foot wound. He currently denies signs of infection. 6/21; patient presents for 2-week follow-up. He reports no issues with his prosthetic sleeve. He has been using antibiotic ointment to the back of the right knee. He has been using silver alginate to the plantar foot wound. He has no issues or complaints today. He denies signs of infection. 7/5; patient presents for 2-week follow-up. He reports no issues or complaints today. He continues to use antibiotic ointment to the right posterior knee wound and silver alginate to the left plantar foot wound. He denies signs of infection. Electronic Signature(s) Signed: 05/27/2021 1:43:34 PM By: Kalman Shan DO Entered By: Kalman Shan on 05/27/2021 13:39:36 -------------------------------------------------------------------------------- Physical Exam Details Patient Name: Date of Service: [name] [name], [name] 05/27/2021 12:30 PM Medical Record Number: [id number] Patient Account Number: [id number] Date of Birth/Sex: Treating RN: 09/03/1971 (50 y.o. [name] [name] Primary Care Provider: [name] Other Clinician: Referring Provider: Treating Provider/Extender: Delano Metz in Treatment: 40 Constitutional respirations regular, non-labored and within target range for patient.. Cardiovascular 2+ dorsalis pedis/posterior tibialis pulses. Psychiatric pleasant and cooperative. Notes Left first met head: Wound with granulation tissue and nonviable tissue present with circumferential callus. No signs of infection. Posterior right knee: Wound limited to skin breakdown with no signs of infection. Appears well-healing Electronic Signature(s) Signed: 05/27/2021 1:43:34 PM By: Kalman Shan DO Entered By: Kalman Shan on  05/27/2021 13:40:15 -------------------------------------------------------------------------------- Physician Orders Details Patient Name: Date of Service: [name] [name], [name] 05/27/2021 12:30 PM Medical Record Number: [id number] Patient Account Number: [id number] Date of Birth/Sex: Treating RN: 04/22/1971 (50 y.o. 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 - Dr. Heber [place] Tuesday Return A Bathing/ Shower/ Hygiene May shower and wash wound with soap and water. - WITH DRESSING CHANGES. Edema Control - Lymphedema / SCD / Other Elevate legs to the level of the heart or above for 30 minutes daily and/or when sitting, a frequency of: Avoid standing for long periods of time. Wound Treatment Wound #2R - Metatarsal head first Wound Laterality: Left Cleanser:  Soap and Water 1 x Per WUX/32 Days Discharge Instructions: May shower and wash wound with dial antibacterial soap and water prior to dressing change. Cleanser: Wound Cleanser (Generic) 1 x Per Day/15 Days Discharge Instructions: Cleanse the wound with wound cleanser prior to applying a clean dressing using gauze sponges, not tissue or cotton balls. Peri-Wound Care: Sween Lotion (Moisturizing lotion) 1 x Per Day/15 Days Discharge Instructions: Apply moisturizing lotion as directed Prim Dressing: KerraCel Ag Gelling Fiber Dressing, 4x5 in (silver alginate) 1 x Per Day/15  Days ary Discharge Instructions: Apply silver alginate to wound bed as instructed Secondary Dressing: Woven Gauze Sponges 2x2 in (Generic) 1 x Per Day/15 Days Discharge Instructions: Apply over primary dressing as directed. Secondary Dressing: Optifoam Non-Adhesive Dressing, 4x4 in (Generic) 1 x Per Day/15 Days Discharge Instructions: Apply over primary dressing cut to make foam donut to help offload Secured With: Conforming Stretch Gauze Bandage, Sterile 2x75 (in/in) (Generic) 1 x Per Day/15 Days Discharge Instructions: Secure with stretch gauze as directed. Secured With: 53M Medipore H Soft Cloth Surgical Tape, 2x2 (in/yd) (Generic) 1 x Per Day/15 Days Discharge Instructions: Secure dressing with tape as directed. Wound #3 - Knee Wound Laterality: Right, Posterior Cleanser: Soap and Water 1 x Per GMW/10 Days Discharge Instructions: May shower and wash wound with dial antibacterial soap and water prior to dressing change. Cleanser: Wound Cleanser (Generic) 1 x Per Day/15 Days Discharge Instructions: Cleanse the wound with wound cleanser prior to applying a clean dressing using gauze sponges, not tissue or cotton balls. Topical: Triple Antibiotic Ointment, 1 (oz) Tube 1 x Per Day/15 Days Secondary Dressing: Zetuvit Plus Silicone Border Dressing 4x4 (in/in) 1 x Per Day/15 Days Discharge Instructions: Apply silicone border over primary dressing as directed. Electronic Signature(s) Signed: 05/27/2021 1:43:34 PM By: Kalman Shan DO Entered By: Kalman Shan on 05/27/2021 13:40:27 -------------------------------------------------------------------------------- Problem List Details Patient Name: Date of Service: [name] [name] [name], [name] 05/27/2021 12:30 PM Medical Record Number: [id number] Patient Account Number: [id number] Date of Birth/Sex: Treating RN: 08/31/1971 (50 y.o. [name] [name] Primary Care Provider: [name] Other Clinician: Referring Provider: Treating  Provider/Extender: Delano Metz in Treatment: 61 Active Problems ICD-10 Encounter Code Description Active Date MDM Diagnosis E11.621 Type 2 diabetes mellitus with foot ulcer 08/16/2020 No Yes S81.801D Unspecified open wound, right lower leg, subsequent encounter 04/08/2021 No Yes L97.522 Non-pressure chronic ulcer of other part of left foot with fat layer exposed 08/16/2020 No Yes E11.42 Type 2 diabetes mellitus with diabetic polyneuropathy 08/16/2020 No Yes Z89.511 Acquired absence of right leg below knee 08/16/2020 No Yes Inactive Problems Resolved Problems Electronic Signature(s) Signed: 05/27/2021 1:43:34 PM By: Kalman Shan DO Entered By: Kalman Shan on 05/27/2021 13:38:45 -------------------------------------------------------------------------------- Progress Note Details Patient Name: Date of Service: [name] [name], [name] 05/27/2021 12:30 PM Medical Record Number: [id number] Patient Account Number: [id number] Date of Birth/Sex: Treating RN: Dec 12, 1970 (50 y.o. [name] [name] Primary Care Provider: [name] Other Clinician: Referring Provider: Treating Provider/Extender: Delano Metz in Treatment: 54 Subjective Chief Complaint Information obtained from Patient patient is here for a review of the wound on his plantar left first metatarsal head and back of his right leg History of Present Illness (HPI) 04/21/18 ADMISSION This is a 50 year old man who is a type II diabetic. In spite of fact his hemoglobin A1c is actually quite good 5.73 months ago he is at a really difficult time over the last 6-7 months. He developed a rapidly progressive infection in  the right foot in November 2018 associated with osteomyelitis and necrotizing fasciitis. 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 The stump required and a revision on 11/24/17. The stump revision was advertised as being secondary to falls. I'm not sure the progressive  history here however this area is actually closed over. The patient tells me over the same timeframe he has had wounds on the plantar aspect of his left foot. In the ED saw Dr. Sharol Given in April of this year. 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 It is really not clear that this patient is been dressing this with anything. He came in with the clinic without any specific dressing on the wound areas using his own tennis shoe. He tells me that he only ambulates of course to do a pivot transfer. He does not have his prosthesis for the right leg as of yet and he blames Medicaid for this. He does however use the foot to push himself along in his wheelchair at home. The patient has not had formal arterial studies. ABI in our clinic on the left was 1.13. Patient's past medical history includes type 2 diabetes, fracture of the right fibula. I see that he was treated for abscesses on his right buttock and chest in 2016. I did not look at the microbiology of this. 04/28/18; patient comes back in the clinic today with the wound pretty much the same as when he came in here last week. Small opening lots of undermining relatively. He tells [place] that nothing is really been on this for 3 days in spite of the fact that we gave him enough to dress this easily especially such a small wound. He says he lives with his mother, she is not capable of assisting with this he is changing the dressings himself. 05/05/18; much better-looking wound today. Smaller. There is some undermining medially however that's only perhaps 2 mm. No surrounding erythema. We've been using silver collagen 05/12/18; small wound on the first metatarsal head. No undermining no surrounding erythema. We've been using silver collagen he has home health coming out to change 05/20/18 the wound on the first metatarsal head looks better. Covered in surface debris/callus nonviable tissue. Required debridement but post  debridement this looks quite good. We've been using silver collagen. He has home health 05/27/18; first metatarsal head wound continues to improve. Just about completely closed. Still a lot of surface debridement callus. We've been using silver collagen 06/06/18; the first metatarsal wound is completely healed over. There is still a lot of callus. I gently removed some of this just to make sure that there was no open area and there is not. The patient mentions to me that his left leg has felt like "lead" for about a week READMISSION 08/16/2020 This is a 50 year old man with type 2 diabetes. He returns to clinic today with a 1 month history of a blister and callus over the first metatarsal head. This is in exactly the same area as when he was here in 2019. 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 He has standard running shoes on the left foot to match the area in his prosthesis. He has only been covering this area with a Band-Aid has not really been specifically dressing this. ABI in our clinic on the left was 1.16. This is essentially stable from the value in 2019 10/1 the patient has a  linear wound over the left first metatarsal head. Again not a lot of callus thick skin around this that I removed with a #3 curette. This is not go deep to bone or muscle it does not look to be infected. 10/8 first metatarsal head. The wound looks like it is closing however this is going to be a difficult area to offload in the future. He has a size 15 shoe and he says his shoes are years old. The inserts in these current shoes are totally useless and I have advised him to replace these. He may need a new pair of shoes and I have he got original prosthesis at [employer] [place] [facility] [place] I have asked him to go back there. 11/9; the patient has not been here in more than a month. Not sure he was healed when he was here the last time. I told him he would have to get new shoes and  certainly offload the area over the left first metatarsal head. He has done nothing of the kind. He arrives back in clinic with the same old new balance sneakers. A very large separating callus over the first metatarsal head on the left 11/16; he has purchased new shoes and has at least 2 insoles like I asked. The wound is measuring much smaller. He is using silver alginate he changes the dressing himself 11/30; wound is measurably smaller in length of about a half a centimeter. This is generally very little depth. We have been using silver alginate. He changes the dressing himself every second day. 12/14; wound is about the same size. Significant undermining medially relative to the size of the wound. We have been using silver alginate. There is not a way to offload this area as he walks with a prosthesis on the right leg 12/28; wound is about the same size. Again he has undermining medially. I am using polymen on this 12/02/2020; the wound looks smaller. Still a senescent edge. We have been using polymen 1/24; the wound is come down a few millimeters. Still a senescent edge. I have been using polymen 2/8; the wound is come down slightly. Still with slight undermining from 12-6 o'clock I have been using polymen partially to get offloading on this area which is opposed by his prosthesis on the other leg. 2/22 quite open improvement he still has a comma shaped wound on the left first metatarsal head. Some depth. Using polymen 3/14; he still has the same problem of a comma shaped area over the left first metatarsal head. This seems to have had more depth today at 0.6 cm. We have been using polymen. The patient changes this himself every second day. I explored the idea of a total contact cast on the left leg however this is his driving foot. He was not enamored with the idea of not being able to drive and states that he does not have anybody else to get him around 3/28; again he comes in with a smaller  looking wound however there is depth and undermining requiring debridement. We have been using Hydrofera Blue, changed to silver collagen today. The patient is doing the dressing himself 4/11; patient presents today for follow-up of his left diabetic foot wound. He denies any issues in the past 2 weeks. He is currently using silver collagen every other day with dressing changes. He does not use diabetic shoes or special inserts to his sneakers. He does not use felt pads for offloading. 4/19; patient presents for his 1 week  follow-up. He denies any issues. He reports using Hydrofera Blue and has not been using silver collagen for dressing changes. He reports minimal drainage to the wound. He overall feels well. 5/3; patient presents for 2-week follow-up. He has been using silver collagen without issues. He has no complaints today and states he overall feels well. 5/17; patient presents for 2-week follow-up. He states that the sleeve is rubbing up against the back of his right leg and causing a wound. He states this happened a week ago and has not been dressing the wound. The plantar wound is stable and he has been using collagen every other day. 5/24; Patient was had a recent hospitalization for cellulitis of his right popliteal fossa. He was admitted and started on IV antibiotics. He does not recall the plantar wound dressed while inpatient. He feels a lot better today. He was discharged on 5/21 on oral antibiotics and has not picked this up until today. He reports using collagen to the plantar wound since discharge. He is keeping the right posterior knee wound covered. He is getting a new sleeve for his prostatic on 5/26. He currently denies signs of infection. 6/1; patient presents for 1 week follow-up. He is still on doxycycline. He has not been dressing the back of the right knee wound. He states he is receiving his new prosthetic sleeve tomorrow. He states he is using silver alginate to the  plantar foot wound. He currently denies signs of infection. He states he has not been offloading the left foot wound 6/7; patient presents for 1 week follow-up. He completes his last dose of doxycycline today. He finally received his prosthetic sleeve for the right side and he states it feels much better. He has been using antibiotic ointment to the back of that right knee wound. He has been using silver alginate to the plantar foot wound. He currently denies signs of infection. 6/21; patient presents for 2-week follow-up. He reports no issues with his prosthetic sleeve. He has been using antibiotic ointment to the back of the right knee. He has been using silver alginate to the plantar foot wound. He has no issues or complaints today. He denies signs of infection. 7/5; patient presents for 2-week follow-up. He reports no issues or complaints today. He continues to use antibiotic ointment to the right posterior knee wound and silver alginate to the left plantar foot wound. He denies signs of infection. Patient History Information obtained from Patient. Family History Diabetes - Maternal Grandparents,Mother, Heart Disease - Father, Hypertension - Father, No family history of Cancer, Hereditary Spherocytosis, Kidney Disease, Lung Disease, Seizures, Stroke, Thyroid Problems, Tuberculosis. Social History Current every day smoker, Marital Status - Single, Alcohol Use - Never, Drug Use - No History, Caffeine Use - Never. Medical History Eyes Denies history of Cataracts, Glaucoma, Optic Neuritis Ear/Nose/Mouth/Throat Denies history of Chronic sinus problems/congestion, Middle ear problems Hematologic/Lymphatic Patient has history of Anemia Denies history of Hemophilia, Human Immunodeficiency Virus, Lymphedema, Sickle Cell Disease Respiratory Denies history of Aspiration, Asthma, Chronic Obstructive Pulmonary Disease (COPD), Pneumothorax, Sleep Apnea, Tuberculosis Cardiovascular Patient has  history of Hypertension, Peripheral Venous Disease Denies history of Angina, Arrhythmia, Congestive Heart Failure, Coronary Artery Disease, Deep Vein Thrombosis, Hypotension, Myocardial Infarction, Peripheral Arterial Disease, Phlebitis, Vasculitis Gastrointestinal Denies history of Cirrhosis , Colitis, Crohnoos, Hepatitis A, Hepatitis B, Hepatitis C Endocrine Patient has history of Type II Diabetes - oral meds Denies history of Type I Diabetes Genitourinary Denies history of End Stage Renal Disease Immunological Denies history of  Lupus Erythematosus, Raynaudoos, Scleroderma Integumentary (Skin) Denies history of History of Burn Musculoskeletal Denies history of Gout, Rheumatoid Arthritis, Osteoarthritis, Osteomyelitis Neurologic Denies history of Dementia, Neuropathy, Quadriplegia, Paraplegia, Seizure Disorder Oncologic Denies history of Received Chemotherapy, Received Radiation Psychiatric Denies history of Anorexia/bulimia, Confinement Anxiety Hospitalization/Surgery History - fever 105, sepsis cellulitis right popliteal fossa 04/09/2021. Medical A Surgical History Notes [name] Constitutional Symptoms (General Health) falls , leukocytosis Respiratory During waking hours and catches himself not breathing, "gasp for air". Saw Dr Wynonia Lawman, he couldn't find a reason Objective Constitutional respirations regular, non-labored and within target range for patient.. Vitals Time Taken: 1:13 PM, Height: 77 in, Weight: 232 lbs, BMI: 27.5, Temperature: 98.9 F, Pulse: 89 bpm, Respiratory Rate: 16 breaths/min, Blood Pressure: 130/79 mmHg, Capillary Blood Glucose: 111 mg/dl. General Notes: glucose per pt report Cardiovascular 2+ dorsalis pedis/posterior tibialis pulses. Psychiatric pleasant and cooperative. General Notes: Left first met head: Wound with granulation tissue and nonviable tissue present with circumferential callus. No signs of infection. Posterior right knee: Wound limited to  skin breakdown with no signs of infection. Appears well-healing Integumentary (Hair, Skin) Wound #2R status is Open. Original cause of wound was Blister. The date acquired was: 06/23/2020. The wound has been in treatment 40 weeks. The wound is located on the Left Metatarsal head first. The wound measures 0.4cm length x 0.5cm width x 0.2cm depth; 0.157cm^2 area and 0.031cm^3 volume. There is Fat Layer (Subcutaneous Tissue) exposed. There is undermining starting at 12:00 and ending at 11:00 with a maximum distance of 0.4cm. There is a medium amount of serosanguineous drainage noted. The wound margin is thickened. There is large (67-100%) pink granulation within the wound bed. There is no necrotic tissue within the wound bed. Wound #3 status is Open. Original cause of wound was Pressure Injury. The date acquired was: 03/31/2021. The wound has been in treatment 7 weeks. The wound is located on the Right,Posterior Knee. The wound measures 1.1cm length x 0.5cm width x 0.1cm depth; 0.432cm^2 area and 0.043cm^3 volume. There is Fat Layer (Subcutaneous Tissue) exposed. There is no tunneling or undermining noted. There is a medium amount of serosanguineous drainage noted. The wound margin is flat and intact. There is large (67-100%) pink, pale granulation within the wound bed. There is a small (1-33%) amount of necrotic tissue within the wound bed including Adherent Slough. 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 The wound to the right posterior knee appears well-healing and I recommended continuing antibiotic ointment to it. The left plantar foot wound had some nonviable tissue and callus and this was debrided.  Overall the wound bed looks healthy. I recommended continuing silver alginate. This is a tough wound to heal due to the fact he has a right prosthetic leg and has to put more pressure on his left side. We discussed continued aggressive offloading. I will see him back in 2 weeks Procedures Wound #2R Pre-procedure diagnosis of Wound #2R is a Diabetic Wound/Ulcer of the Lower Extremity located on the Left Metatarsal head first .Severity of Tissue Pre Debridement is: Fat layer exposed. There was a Excisional Skin/Subcutaneous Tissue Debridement with a total area of 1 sq cm performed by Kalman Shan, DO. With the following instrument(s): Curette to remove Viable and Non-Viable tissue/material. Material removed  includes Callus, Subcutaneous Tissue, Slough, Skin: Dermis, Skin: Epidermis, and Fibrin/Exudate after achieving pain control using Lidocaine 4% T opical Solution. A time out was conducted at 13:30, prior to the start of the procedure. A Large amount of bleeding was controlled with Pressure. The procedure was tolerated well with a pain level of 0 throughout and a pain level of 1 following the procedure. Post Debridement Measurements: 0.4cm length x 0.5cm width x 0.2cm depth; 0.031cm^3 volume. Character of Wound/Ulcer Post Debridement is improved. Severity of Tissue Post Debridement is: Fat layer exposed. Post procedure Diagnosis Wound #2R: Same as Pre-Procedure Plan Follow-up Appointments: Return Appointment in 2 weeks. - Dr. Heber [place] Tuesday Bathing/ Shower/ Hygiene: May shower and wash wound with soap and water. - WITH DRESSING CHANGES. Edema Control - Lymphedema / SCD / Other: Elevate legs to the level of the heart or above for 30 minutes daily and/or when sitting, a frequency of: Avoid standing for long periods of time. WOUND #2R: - Metatarsal head first Wound Laterality: Left Cleanser: Soap and Water 1 x Per Day/15 Days Discharge Instructions: May shower and wash wound with dial  antibacterial soap and water prior to dressing change. Cleanser: Wound Cleanser (Generic) 1 x Per Day/15 Days Discharge Instructions: Cleanse the wound with wound cleanser prior to applying a clean dressing using gauze sponges, not tissue or cotton balls. Peri-Wound Care: Sween Lotion (Moisturizing lotion) 1 x Per Day/15 Days Discharge Instructions: Apply moisturizing lotion as directed Prim Dressing: KerraCel Ag Gelling Fiber Dressing, 4x5 in (silver alginate) 1 x Per Day/15 Days ary Discharge Instructions: Apply silver alginate to wound bed as instructed Secondary Dressing: Woven Gauze Sponges 2x2 in (Generic) 1 x Per Day/15 Days Discharge Instructions: Apply over primary dressing as directed. Secondary Dressing: Optifoam Non-Adhesive Dressing, 4x4 in (Generic) 1 x Per Day/15 Days Discharge Instructions: Apply over primary dressing cut to make foam donut to help offload Secured With: Conforming Stretch Gauze Bandage, Sterile 2x75 (in/in) (Generic) 1 x Per Day/15 Days Discharge Instructions: Secure with stretch gauze as directed. Secured With: 11M Medipore H Soft Cloth Surgical T ape, 2x2 (in/yd) (Generic) 1 x Per Day/15 Days Discharge Instructions: Secure dressing with tape as directed. WOUND #3: - Knee Wound Laterality: Right, Posterior Cleanser: Soap and Water 1 x Per MWN/02 Days Discharge Instructions: May shower and wash wound with dial antibacterial soap and water prior to dressing change. Cleanser: Wound Cleanser (Generic) 1 x Per Day/15 Days Discharge Instructions: Cleanse the wound with wound cleanser prior to applying a clean dressing using gauze sponges, not tissue or cotton balls. Topical: Triple Antibiotic Ointment, 1 (oz) Tube 1 x Per Day/15 Days Secondary Dressing: Zetuvit Plus Silicone Border Dressing 4x4 (in/in) 1 x Per Day/15 Days Discharge Instructions: Apply silicone border over primary dressing as directed. 1. In office sharp debridement 2. Antibiotic ointment to the  right posterior knee wound 3. Silver alginate to the left plantar foot wound 4. Follow-up 2 weeks 5. Aggressive offloading Electronic Signature(s) Signed: 05/27/2021 1:43:34 PM By: Kalman Shan DO Entered By: Kalman Shan on 05/27/2021 13:42:53 -------------------------------------------------------------------------------- HxROS Details Patient Name: Date of Service: [name] [name], [name] 05/27/2021 12:30 PM Medical Record Number: [id number] Patient Account Number: [id number] Date of Birth/Sex: Treating RN: 10/29/1971 (50 y.o. [name] [name] Primary Care Provider: [name] Other Clinician: Referring Provider: Treating Provider/Extender: Delano Metz in Treatment: 75 Information Obtained From Patient Constitutional Symptoms (General Health) Medical History: Past Medical History Notes: falls , leukocytosis Eyes Medical History: Negative for: Cataracts;  Glaucoma; Optic Neuritis Ear/Nose/Mouth/Throat Medical History: Negative for: Chronic sinus problems/congestion; Middle ear problems Hematologic/Lymphatic Medical History: Positive for: Anemia Negative for: Hemophilia; Human Immunodeficiency Virus; Lymphedema; Sickle Cell Disease Respiratory Medical History: Negative for: Aspiration; Asthma; Chronic Obstructive Pulmonary Disease (COPD); Pneumothorax; Sleep Apnea; Tuberculosis Past Medical History Notes: During waking hours and catches himself not breathing, "gasp for air". Saw Dr Wynonia Lawman, he couldn't find a reason Cardiovascular Medical History: Positive for: Hypertension; Peripheral Venous Disease Negative for: Angina; Arrhythmia; Congestive Heart Failure; Coronary Artery Disease; Deep Vein Thrombosis; Hypotension; Myocardial Infarction; Peripheral Arterial Disease; Phlebitis; Vasculitis Gastrointestinal Medical History: Negative for: Cirrhosis ; Colitis; Crohns; Hepatitis A; Hepatitis B; Hepatitis C Endocrine Medical  History: Positive for: Type II Diabetes - oral meds Negative for: Type I Diabetes Time with diabetes: 2014 Treated with: Oral agents Blood sugar tested every day: Yes Tested : Blood sugar testing results: Breakfast: 91 Genitourinary Medical History: Negative for: End Stage Renal Disease Immunological Medical History: Negative for: Lupus Erythematosus; Raynauds; Scleroderma Integumentary (Skin) Medical History: Negative for: History of Burn Musculoskeletal Medical History: Negative for: Gout; Rheumatoid Arthritis; Osteoarthritis; Osteomyelitis Neurologic Medical History: Negative for: Dementia; Neuropathy; Quadriplegia; Paraplegia; Seizure Disorder Oncologic Medical History: Negative for: Received Chemotherapy; Received Radiation Psychiatric Medical History: Negative for: Anorexia/bulimia; Confinement Anxiety Immunizations Pneumococcal Vaccine: Received Pneumococcal Vaccination: No Implantable Devices No devices added Hospitalization / Surgery History Type of Hospitalization/Surgery fever 105, sepsis cellulitis right popliteal fossa 04/09/2021 Family and Social History Cancer: No; Diabetes: Yes - Maternal Grandparents,Mother; Heart Disease: Yes - Father; Hereditary Spherocytosis: No; Hypertension: Yes - Father; Kidney Disease: No; Lung Disease: No; Seizures: No; Stroke: No; Thyroid Problems: No; Tuberculosis: No; Current every day smoker; Marital Status - Single; Alcohol Use: Never; Drug Use: No History; Caffeine Use: Never; Financial Concerns: No; Food, Clothing or Shelter Needs: No; Support System Lacking: No; Transportation Concerns: No Electronic Signature(s) Signed: 05/27/2021 1:43:34 PM By: Kalman Shan DO Signed: 05/27/2021 6:55:28 PM By: Deon Pilling Entered By: Kalman Shan on 05/27/2021 13:39:43 -------------------------------------------------------------------------------- SuperBill Details Patient Name: Date of Service: [name] [name] [name], [name]  05/27/2021 Medical Record Number: [id number] Patient Account Number: [id number] Date of Birth/Sex: Treating RN: 07-04-1971 (50 y.o. [name] [name], [name] [name] Primary Care Provider: [name] Other Clinician: Referring Provider: Treating Provider/Extender: Delano Metz in Treatment: 40 Diagnosis Coding ICD-10 Codes Code Description E11.621 Type 2 diabetes mellitus with foot ulcer S81.801D Unspecified open wound, right lower leg, subsequent encounter L97.522 Non-pressure chronic ulcer of other part of left foot with fat layer exposed E11.42 Type 2 diabetes mellitus with diabetic polyneuropathy Z89.511 Acquired absence of right leg below knee Facility Procedures CPT4 Code: 22336122 Description: 44975 - DEB SUBQ TISSUE 20 SQ CM/< ICD-10 Diagnosis Description E11.621 Type 2 diabetes mellitus with foot ulcer L97.522 Non-pressure chronic ulcer of other part of left foot with fat layer exposed E11.42 Type 2 diabetes mellitus with  diabetic polyneuropathy Z89.511 Acquired absence of right leg below knee Modifier: Quantity: 1 Physician Procedures : CPT4 Code Description Modifier [id number] - WC PHYS SUBQ TISS 20 SQ CM ICD-10 Diagnosis Description E11.621 Type 2 diabetes mellitus with foot ulcer L97.522 Non-pressure chronic ulcer of other part of left foot with fat layer exposed E11.42 Type 2  diabetes mellitus with diabetic polyneuropathy Z89.511 Acquired absence of right leg below knee Quantity: 1 Electronic Signature(s) Signed: 05/27/2021 1:43:34 PM By: Kalman Shan DO Entered By: Kalman Shan on 05/27/2021 13:43:12

## 2021-06-02 ENCOUNTER — Telehealth: Payer: Self-pay | Admitting: Endocrinology

## 2021-06-02 ENCOUNTER — Other Ambulatory Visit: Payer: Self-pay

## 2021-06-02 DIAGNOSIS — E1165 Type 2 diabetes mellitus with hyperglycemia: Secondary | ICD-10-CM

## 2021-06-02 MED ORDER — PEN NEEDLES 30G X 5 MM MISC: 3 refills | Status: DC

## 2021-06-02 NOTE — Telephone Encounter (Signed)
Patient received Pen but did not get any pen needles.

## 2021-06-04 ENCOUNTER — Other Ambulatory Visit: Payer: Self-pay

## 2021-06-04 ENCOUNTER — Ambulatory Visit (AMBULATORY_SURGERY_CENTER): Payer: Medicare Other | Admitting: Gastroenterology

## 2021-06-04 ENCOUNTER — Encounter: Payer: Self-pay | Admitting: Gastroenterology

## 2021-06-04 VITALS — BP 151/90 | HR 80 | Temp 97.3°F | Resp 15 | Ht 76.0 in | Wt 229.0 lb

## 2021-06-04 DIAGNOSIS — R112 Nausea with vomiting, unspecified: Secondary | ICD-10-CM | POA: Diagnosis not present

## 2021-06-04 MED ORDER — METOCLOPRAMIDE HCL 5 MG PO TABS: 5.0000 mg | ORAL_TABLET | Freq: Three times a day (TID) | ORAL | 1 refills | Status: DC | PRN

## 2021-06-04 MED ORDER — SODIUM CHLORIDE 0.9 % IV SOLN: 500.0000 mL | Freq: Once | INTRAVENOUS | Status: DC

## 2021-06-04 NOTE — Progress Notes (Signed)
Check-in-AER  Vital Signs-VV

## 2021-06-04 NOTE — Progress Notes (Signed)
A and O x3. Report to RN. Tolerated MAC anesthesia well.Teeth unchanged after procedure. 

## 2021-06-04 NOTE — Op Note (Signed)
[facility] Patient Name: [name] [name] Procedure Date: 06/04/2021 9:29 AM MRN: [id number] Endoscopist: [place]. Loletha Carrow , MD Age: 50 Referring MD:  Date of Birth: 26-Jan-1971 Gender: Male Account #: [id number] Procedure:                Upper GI endoscopy Indications:              Nausea with vomiting                           Type 1 DM with complications                           Recent normal GES Medicines:                Monitored Anesthesia Care Procedure:                Pre-Anesthesia Assessment:                           - Prior to the procedure, a History and Physical                            was performed, and patient medications and                            allergies were reviewed. The patient's tolerance of                            previous anesthesia was also reviewed. The risks                            and benefits of the procedure and the sedation                            options and risks were discussed with the patient.                            All questions were answered, and informed consent                            was obtained. Prior Anticoagulants: The patient has                            taken no previous anticoagulant or antiplatelet                            agents. ASA Grade Assessment: III - A patient with                            severe systemic disease. After reviewing the risks                            and benefits, the patient was deemed in  satisfactory condition to undergo the procedure.                           After obtaining informed consent, the endoscope was                            passed under direct vision. Throughout the                            procedure, the patient's blood pressure, pulse, and                            oxygen saturations were monitored continuously. The                            GIF [id number] was introduced through the                            mouth, and  advanced to the second part of duodenum.                            The upper GI endoscopy was accomplished without                            difficulty. The patient tolerated the procedure                            well. Scope In: Scope Out: Findings:                 The esophagus was normal.                           The stomach was normal.                           The cardia and gastric fundus were normal on                            retroflexion.                           The examined duodenum was normal. Complications:            No immediate complications. Estimated Blood Loss:     Estimated blood loss: none. Impression:               - Normal esophagus.                           - Normal stomach.                           - Normal examined duodenum.                           - No specimens collected.  N/V from episodic hyperglycemia. Recommendation:           - Patient has a contact number available for                            emergencies. The signs and symptoms of potential                            delayed complications were discussed with the                            patient. Return to normal activities tomorrow.                            Written discharge instructions were provided to the                            patient.                           - Resume previous diet.                           - Continue present medications.                           - Use Reglan (metoclopramide) 5 mg PO every 8 hours                            as needed for nausea and vomiting. Disp# 45, RF 1                           - Return to my office at appointment to be                            scheduled. (6-8 weeks) Mallie Mussel L. Loletha Carrow, MD 06/04/2021 10:26:05 AM This report has been signed electronically.

## 2021-06-04 NOTE — Patient Instructions (Signed)
Please read handouts provided. Continue present medications. Use Reglan ( metoclopramide ) 5 mg every 8 hours as needed for nausea and vomiting. Return to see Dr. Loletha Carrow at his office in 6-8 weeks.   YOU HAD AN ENDOSCOPIC PROCEDURE TODAY AT [facility]:   Refer to the procedure report that was given to you for any specific questions about what was found during the examination.  If the procedure report does not answer your questions, please call your gastroenterologist to clarify.  If you requested that your care partner not be given the details of your procedure findings, then the procedure report has been included in a sealed envelope for you to review at your convenience later.  YOU SHOULD EXPECT: Some feelings of bloating in the abdomen. Passage of more gas than usual.  Walking can help get rid of the air that was put into your GI tract during the procedure and reduce the bloating. If you had a lower endoscopy (such as a colonoscopy or flexible sigmoidoscopy) you may notice spotting of blood in your stool or on the toilet paper. If you underwent a bowel prep for your procedure, you may not have a normal bowel movement for a few days.  Please Note:  You might notice some irritation and congestion in your nose or some drainage.  This is from the oxygen used during your procedure.  There is no need for concern and it should clear up in a day or so.  SYMPTOMS TO REPORT IMMEDIATELY:    Following upper endoscopy (EGD)  Vomiting of blood or coffee ground material  New chest pain or pain under the shoulder blades  Painful or persistently difficult swallowing  New shortness of breath  Fever of 100F or higher  Black, tarry-looking stools  For urgent or emergent issues, a gastroenterologist can be reached at any hour by calling [phone]. Do not use MyChart messaging for urgent concerns.    DIET:  We do recommend a small meal at first, but then you may proceed to your  regular diet.  Drink plenty of fluids but you should avoid alcoholic beverages for 24 hours.  ACTIVITY:  You should plan to take it easy for the rest of today and you should NOT DRIVE or use heavy machinery until tomorrow (because of the sedation medicines used during the test).    FOLLOW UP: Our staff will call the number listed on your records 48-72 hours following your procedure to check on you and address any questions or concerns that you may have regarding the information given to you following your procedure. If we do not reach you, we will leave a message.  We will attempt to reach you two times.  During this call, we will ask if you have developed any symptoms of COVID 19. If you develop any symptoms (ie: fever, flu-like symptoms, shortness of breath, cough etc.) before then, please call [phone].  If you test positive for Covid 19 in the 2 weeks post procedure, please call and report this information to [place].    If any biopsies were taken you will be contacted by phone or by letter within the next 1-3 weeks.  Please call us at [phone] if you have not heard about the biopsies in 3 weeks.    SIGNATURES/CONFIDENTIALITY: You and/or your care partner have signed paperwork which will be entered into your electronic medical record.  These signatures attest to the fact that that the information above on your After Visit  Summary has been reviewed and is understood.  Full responsibility of the confidentiality of this discharge information lies with you and/or your care-partner.  

## 2021-06-05 ENCOUNTER — Telehealth: Payer: Self-pay

## 2021-06-05 NOTE — Telephone Encounter (Signed)
Per 06/04/21 procedure report - Return to my office at appt to be scheduled (6-8 wks)  Patient has been scheduled for an 8-week follow up with Dr. Loletha Carrow on Wednesday, 07/30/21 at 3 PM. Letter mailed to patient with appt information.

## 2021-06-06 ENCOUNTER — Telehealth: Payer: Self-pay | Admitting: *Deleted

## 2021-06-06 NOTE — Telephone Encounter (Signed)
  Follow up Call-  Call back number 06/04/2021 05/24/2020 05/11/2019  Post procedure Call Back phone  # [phone]  Permission to leave phone message Yes Yes Yes  Some recent data might be hidden     Patient questions:  Do you have a fever, pain , or abdominal swelling? No. Pain Score  0 *  Have you tolerated food without any problems? Yes.    Have you been able to return to your normal activities? Yes.    Do you have any questions about your discharge instructions: Diet   No. Medications  No. Follow up visit  No.  Do you have questions or concerns about your Care? No.  Actions: * If pain score is 4 or above: No action needed, pain <4.Have you developed a fever since your procedure? no  2.   Have you had an respiratory symptoms (SOB or cough) since your procedure? no  3.   Have you tested positive for COVID 19 since your procedure no  4.   Have you had any family members/close contacts diagnosed with the COVID 19 since your procedure?  no   If yes to any of these questions please route to Joylene John, RN and Joella Prince, RN

## 2021-06-10 ENCOUNTER — Encounter (HOSPITAL_BASED_OUTPATIENT_CLINIC_OR_DEPARTMENT_OTHER): Payer: Medicare Other | Admitting: Internal Medicine

## 2021-06-10 ENCOUNTER — Other Ambulatory Visit: Payer: Self-pay

## 2021-06-10 DIAGNOSIS — L97522 Non-pressure chronic ulcer of other part of left foot with fat layer exposed: Secondary | ICD-10-CM

## 2021-06-10 DIAGNOSIS — E11621 Type 2 diabetes mellitus with foot ulcer: Secondary | ICD-10-CM | POA: Diagnosis not present

## 2021-06-10 NOTE — Progress Notes (Signed)
AHAAN, ZOBRIST ([id number]) Visit Report for 06/10/2021 Arrival Information Details Patient Name: Date of Service: [name] [name] 06/10/2021 12:30 PM Medical Record Number: [id number] Patient Account Number: [id number] Date of Birth/Sex: Treating RN: 10-07-1971 (50 y.o. [name], [name] Primary Care Karem Tomaso: [name] Other Clinician: Referring Lindon Kiel: Treating Khan Chura/Extender: Delano Metz in Treatment: 56 Visit Information History Since Last Visit Added or deleted any medications: No Patient Arrived: Ambulatory Any new allergies or adverse reactions: No Arrival Time: 12:52 Had a fall or experienced change in No Accompanied By: self activities of daily living that may affect Transfer Assistance: None risk of falls: Patient Identification Verified: Yes Signs or symptoms of abuse/neglect since last visito No Secondary Verification Process Completed: Yes Hospitalized since last visit: No Patient Requires Transmission-Based Precautions: No Implantable device outside of the clinic excluding No Patient Has Alerts: No cellular tissue based products placed in the center since last visit: Has Dressing in Place as Prescribed: Yes Pain Present Now: No Electronic Signature(s) Signed: 06/10/2021 3:24:23 PM By: Sandre Kitty Entered By: Sandre Kitty on 06/10/2021 12:52:41 -------------------------------------------------------------------------------- Encounter Discharge Information Details Patient Name: Date of Service: [name] [name] ND, [name] 06/10/2021 12:30 PM Medical Record Number: [id number] Patient Account Number: [id number] Date of Birth/Sex: Treating RN: 26-Nov-1970 (50 y.o. [name] Primary Care Kreig Parson: [name] Other Clinician: Referring Meko Masterson: Treating Dyon Rotert/Extender: Delano Metz in Treatment: 1 Encounter Discharge Information Items Post Procedure Vitals Discharge  Condition: Stable Temperature (F): 98.4 Ambulatory Status: Ambulatory Pulse (bpm): 79 Discharge Destination: Home Respiratory Rate (breaths/min): 16 Transportation: Private Auto Blood Pressure (mmHg): 126/75 Accompanied By: self Schedule Follow-up Appointment: Yes Clinical Summary of Care: Electronic Signature(s) Signed: 06/10/2021 6:09:20 PM By: Deon Pilling Entered By: Deon Pilling on 06/10/2021 13:30:23 -------------------------------------------------------------------------------- Lower Extremity Assessment Details Patient Name: Date of Service: [name] [name] [name] 06/10/2021 12:30 PM Medical Record Number: [id number] Patient Account Number: [id number] Date of Birth/Sex: Treating RN: 22-Nov-1971 (50 y.o. [name] Primary Care Jeidy Hoerner: [name] Other Clinician: Referring Mayla Biddy: Treating Azrael Maddix/Extender: Delano Metz in Treatment: 42 Edema Assessment Assessed: [name]: Yes] [name]: No] Edema: [Left: Ye] [Right: s] Calf Left: Right: Point of Measurement: 50 cm From Medial Instep 41 cm Ankle Left: Right: Point of Measurement: 11 cm From Medial Instep 27 cm Electronic Signature(s) Signed: 06/10/2021 6:09:20 PM By: Deon Pilling Entered By: Deon Pilling on 06/10/2021 13:01:16 -------------------------------------------------------------------------------- Multi Wound Chart Details Patient Name: Date of Service: [name] [name] ND, [name] 06/10/2021 12:30 PM Medical Record Number: [id number] Patient Account Number: [id number] Date of Birth/Sex: Treating RN: Feb 12, 1971 (50 y.o. [name] Primary Care Tressia Labrum: [name] Other Clinician: Referring Kendrah Lovern: Treating Keshia Weare/Extender: Delano Metz in Treatment: 42 Vital Signs Height(in): 72 Capillary Blood Glucose(mg/dl): 52 Weight(lbs): 232 Pulse(bpm): 72 Body Mass Index(BMI): 28 Blood Pressure(mmHg): 126/75 Temperature(F):  98.4 Respiratory Rate(breaths/min): 16 Photos: [2R:No Photos Left Metatarsal head first] [3:No Photos Right, Posterior Knee] [N/A:N/A N/A] Wound Location: [2R:Blister] [3:Pressure Injury] [N/A:N/A] Wounding Event: [2R:Diabetic Wound/Ulcer of the Lower] [3:Pressure Ulcer] [N/A:N/A] Primary Etiology: [2R:Extremity Anemia, Hypertension, Peripheral] [3:Anemia, Hypertension, Peripheral] [N/A:N/A] Comorbid History: [2R:Venous Disease, Type II Diabetes 06/23/2020] [3:Venous Disease, Type II Diabetes 03/31/2021] [N/A:N/A] Date Acquired: [2R:42] [3:9] [N/A:N/A] Weeks of Treatment: [2R:Open] [3:Open] [N/A:N/A] Wound Status: [2R:Yes] [3:No] [N/A:N/A] Wound Recurrence: [2R:No] [3:Yes] [N/A:N/A] Clustered Wound: [2R:N/A] [3:1] [N/A:N/A] Clustered Quantity: [2R:0.4x0.3x0.2] [3:0.2x0.2x0.1] [N/A:N/A] Measurements L x W x D (cm) [2R:0.094] [3:0.031] [N/A:N/A] A (cm) : rea [3U:0.233] [3:0.003] [N/A:N/A]  Volume (cm) : [2R:-203.20%] [3:98.10%] [N/A:N/A] % Reduction in A rea: [2R:-533.30%] [3:98.20%] [N/A:N/A] % Reduction in Volume: [2R:12] Starting Position 1 (o'clock): [2R:12] Ending Position 1 (o'clock): [2R:0.4] Maximum Distance 1 (cm): [2R:Yes] [3:No] [N/A:N/A] Undermining: [2R:Grade 2] [3:Unstageable/Unclassified] [N/A:N/A] Classification: [2R:Medium] [3:Medium] [N/A:N/A] Exudate A mount: [2R:Serosanguineous] [3:Serosanguineous] [N/A:N/A] Exudate Type: [2R:red, brown] [3:red, brown] [N/A:N/A] Exudate Color: [2R:Thickened] [3:Flat and Intact] [N/A:N/A] Wound Margin: [2R:Large (67-100%)] [3:Large (67-100%)] [N/A:N/A] Granulation A mount: [2R:Pink] [3:Pink, Pale] [N/A:N/A] Granulation Quality: [2R:None Present (0%)] [3:Small (1-33%)] [N/A:N/A] Necrotic A mount: [2R:Fat Layer (Subcutaneous Tissue): Yes Fat Layer (Subcutaneous Tissue): Yes N/A] Exposed Structures: [2R:Fascia: No Tendon: No Muscle: No Joint: No Bone: No Large (67-100%)] [3:Fascia: No Tendon: No Muscle: No Joint: No Bone: No Small (1-33%)]  [N/A:N/A] Epithelialization: [2R:Debridement - Excisional] [3:N/A] [N/A:N/A] Debridement: Pre-procedure Verification/Time Out 13:20 [3:N/A] [N/A:N/A] Taken: [2R:Lidocaine 4% Topical Solution] [3:N/A] [N/A:N/A] Pain Control: [2R:Callus, Subcutaneous] [3:N/A] [N/A:N/A] Tissue Debrided: [2R:Skin/Subcutaneous Tissue] [3:N/A] [N/A:N/A] Level: [2R:0.4] [3:N/A] [N/A:N/A] Debridement A (sq cm): [2R:rea Curette] [3:N/A] [N/A:N/A] Instrument: [2R:Minimum] [3:N/A] [N/A:N/A] Bleeding: [2R:Pressure] [3:N/A] [N/A:N/A] Hemostasis A chieved: [2R:0] [3:N/A] [N/A:N/A] Procedural Pain: [2R:0] [3:N/A] [N/A:N/A] Post Procedural Pain: [2R:Procedure was tolerated well] [3:N/A] [N/A:N/A] Debridement Treatment Response: [2R:0.4x0.3x0.2] [3:N/A] [N/A:N/A] Post Debridement Measurements L x W x D (cm) [4R:1.540] [3:N/A] [N/A:N/A] Post Debridement Volume: (cm) [2R:callous periwound.] [3:N/A] [N/A:N/A] Assessment Notes: [2R:Debridement] [3:N/A] [N/A:N/A] Treatment Notes Wound #2R (Metatarsal head first) Wound Laterality: Left Cleanser Soap and Water Discharge Instruction: May shower and wash wound with dial antibacterial soap and water prior to dressing change. Wound Cleanser Discharge Instruction: Cleanse the wound with wound cleanser prior to applying a clean dressing using gauze sponges, not tissue or cotton balls. Peri-Wound Care Sween Lotion (Moisturizing lotion) Discharge Instruction: Apply moisturizing lotion as directed Topical Primary Dressing KerraCel Ag Gelling Fiber Dressing, 4x5 in (silver alginate) Discharge Instruction: Apply silver alginate to wound bed as instructed Secondary Dressing Woven Gauze Sponges 2x2 in Discharge Instruction: Apply over primary dressing as directed. Optifoam Non-Adhesive Dressing, 4x4 in Discharge Instruction: Apply over primary dressing cut to make foam donut to help offload Secured With Conforming Stretch Gauze Bandage, Sterile 2x75 (in/in) Discharge  Instruction: Secure with stretch gauze as directed. 80M Medipore H Soft Cloth Surgical T ape, 2x2 (in/yd) Discharge Instruction: Secure dressing with tape as directed. Compression Wrap Compression Stockings Add-Ons Wound #3 (Knee) Wound Laterality: Right, Posterior Cleanser Soap and Water Discharge Instruction: May shower and wash wound with dial antibacterial soap and water prior to dressing change. Wound Cleanser Discharge Instruction: Cleanse the wound with wound cleanser prior to applying a clean dressing using gauze sponges, not tissue or cotton balls. Peri-Wound Care Topical Triple Antibiotic Ointment, 1 (oz) Tube Primary Dressing Secondary Dressing Zetuvit Plus Silicone Border Dressing 4x4 (in/in) Discharge Instruction: Apply silicone border over primary dressing as directed. Secured With Compression Wrap Compression Stockings Environmental education officer) Signed: 06/10/2021 1:54:34 PM By: Kalman Shan DO Signed: 06/10/2021 6:09:20 PM By: Deon Pilling Entered By: Kalman Shan on 06/10/2021 13:49:09 -------------------------------------------------------------------------------- Multi-Disciplinary Care Plan Details Patient Name: Date of Service: [name] [name] ND, [name] 06/10/2021 12:30 PM Medical Record Number: [id number] Patient Account Number: [id number] Date of Birth/Sex: Treating RN: 1970-11-27 (50 y.o. [name] Primary Care Jalil Lorusso: [name] Other Clinician: Referring Davius Goudeau: Treating Josel Keo/Extender: Delano Metz in Treatment: Kenton Vale reviewed with physician Active Inactive Wound/Skin Impairment Nursing Diagnoses: Impaired tissue integrity Goals: Patient/caregiver will verbalize understanding of skin care regimen Date Initiated: 11/19/2020 Target Resolution Date: 06/13/2021 Goal Status: Active  Ulcer/skin breakdown will have a volume reduction of 30% by week 4 Date Initiated:  08/16/2020 Date Inactivated: 03/03/2021 Target Resolution Date: 03/10/2021 Goal Status: Met Interventions: Provide education on ulcer and skin care Notes: Electronic Signature(s) Signed: 06/10/2021 6:09:20 PM By: Deon Pilling Entered By: Deon Pilling on 06/10/2021 13:02:42 -------------------------------------------------------------------------------- Pain Assessment Details Patient Name: Date of Service: [name] [name] ND, [name] 06/10/2021 12:30 PM Medical Record Number: [id number] Patient Account Number: [id number] Date of Birth/Sex: Treating RN: 1971-08-06 (50 y.o. [name] Primary Care Evangelene Vora: [name] Other Clinician: Referring Kenyatta Gloeckner: Treating Ariyan Brisendine/Extender: Delano Metz in Treatment: 38 Active Problems Location of Pain Severity and Description of Pain Patient Has Paino No Site Locations Pain Management and Medication Current Pain Management: Electronic Signature(s) Signed: 06/10/2021 3:24:23 PM By: Sandre Kitty Signed: 06/10/2021 6:09:20 PM By: Deon Pilling Entered By: Sandre Kitty on 06/10/2021 12:53:08 -------------------------------------------------------------------------------- Patient/Caregiver Education Details Patient Name: Date of Service: [name] [name] 7/19/2022andnbsp12:30 PM Medical Record Number: [id number] Patient Account Number: [id number] Date of Birth/Gender: Treating RN: 08-17-1971 (50 y.o. [name] Primary Care Physician: [name] Other Clinician: Referring Physician: Treating Physician/Extender: Delano Metz in Treatment: 79 Education Assessment Education Provided To: Patient Education Topics Provided Wound/Skin Impairment: Handouts: Skin Care Do's and Dont's Methods: Explain/Verbal Responses: Reinforcements needed Electronic Signature(s) Signed: 06/10/2021 6:09:20 PM By: Deon Pilling Entered By: Deon Pilling on 06/10/2021  13:02:53 -------------------------------------------------------------------------------- Wound Assessment Details Patient Name: Date of Service: [name] [name] [name] 06/10/2021 12:30 PM Medical Record Number: [id number] Patient Account Number: [id number] Date of Birth/Sex: Treating RN: [birth year] (50 y.o. [name], [name] Primary Care Gavyn Ybarra: [name] Other Clinician: Referring Kemaria Dedic: Treating Jonah Nestle/Extender: Delano Metz in Treatment: 42 Wound Status Wound Number: 2R Primary Diabetic Wound/Ulcer of the Lower Extremity Etiology: Wound Location: Left Metatarsal head first Wound Status: Open Wounding Event: Blister Comorbid Anemia, Hypertension, Peripheral Venous Disease, Type II Date Acquired: 06/23/2020 History: Diabetes Weeks Of Treatment: 42 Clustered Wound: No Photos Photo Uploaded By: Sandre Kitty on 06/10/2021 16:39:58 Wound Measurements Length: (cm) 0.4 Width: (cm) 0.3 Depth: (cm) 0.2 Area: (cm) 0.094 Volume: (cm) 0.019 % Reduction in Area: -203.2% % Reduction in Volume: -533.3% Epithelialization: Large (67-100%) Tunneling: No Undermining: Yes Starting Position (o'clock): 12 Ending Position (o'clock): 12 Maximum Distance: (cm) 0.4 Wound Description Classification: Grade 2 Wound Margin: Thickened Exudate Amount: Medium Exudate Type: Serosanguineous Exudate Color: red, brown Foul Odor After Cleansing: No Slough/Fibrino No Wound Bed Granulation Amount: Large (67-100%) Exposed Structure Granulation Quality: Pink Fascia Exposed: No Necrotic Amount: None Present (0%) Fat Layer (Subcutaneous Tissue) Exposed: Yes Tendon Exposed: No Muscle Exposed: No Joint Exposed: No Bone Exposed: No Assessment Notes callous periwound. Treatment Notes Wound #2R (Metatarsal head first) Wound Laterality: Left Cleanser Soap and Water Discharge Instruction: May shower and wash wound with dial antibacterial soap and water prior  to dressing change. Wound Cleanser Discharge Instruction: Cleanse the wound with wound cleanser prior to applying a clean dressing using gauze sponges, not tissue or cotton balls. Peri-Wound Care Sween Lotion (Moisturizing lotion) Discharge Instruction: Apply moisturizing lotion as directed Topical Primary Dressing KerraCel Ag Gelling Fiber Dressing, 4x5 in (silver alginate) Discharge Instruction: Apply silver alginate to wound bed as instructed Secondary Dressing Woven Gauze Sponges 2x2 in Discharge Instruction: Apply over primary dressing as directed. Optifoam Non-Adhesive Dressing, 4x4 in Discharge Instruction: Apply over primary dressing cut to make foam donut to help offload Secured With Conforming Stretch Gauze Bandage, Sterile 2x75 (in/in)  Discharge Instruction: Secure with stretch gauze as directed. 74M Medipore H Soft Cloth Surgical T ape, 2x2 (in/yd) Discharge Instruction: Secure dressing with tape as directed. Compression Wrap Compression Stockings Add-Ons Electronic Signature(s) Signed: 06/10/2021 6:09:20 PM By: Deon Pilling Entered By: Deon Pilling on 06/10/2021 13:02:11 -------------------------------------------------------------------------------- Wound Assessment Details Patient Name: Date of Service: [name] [name] [name] 06/10/2021 12:30 PM Medical Record Number: [id number] Patient Account Number: [id number] Date of Birth/Sex: Treating RN: 1971-07-16 (50 y.o. [name], [name] Primary Care Landri Dorsainvil: [name] Other Clinician: Referring Karel Mowers: Treating Jaycee Mckellips/Extender: Delano Metz in Treatment: 42 Wound Status Wound Number: 3 Primary Pressure Ulcer Etiology: Wound Location: Right, Posterior Knee Wound Status: Open Wounding Event: Pressure Injury Comorbid Anemia, Hypertension, Peripheral Venous Disease, Type II Date Acquired: 03/31/2021 History: Diabetes Weeks Of Treatment: 9 Clustered Wound: Yes Photos Photo  Uploaded By: Sandre Kitty on 06/10/2021 16:40:14 Wound Measurements Length: (cm) 0.2 Width: (cm) 0.2 Depth: (cm) 0.1 Clustered Quantity: 1 Area: (cm) 0.031 Volume: (cm) 0.003 % Reduction in Area: 98.1% % Reduction in Volume: 98.2% Epithelialization: Small (1-33%) Tunneling: No Undermining: No Wound Description Classification: Unstageable/Unclassified Wound Margin: Flat and Intact Exudate Amount: Medium Exudate Type: Serosanguineous Exudate Color: red, brown Foul Odor After Cleansing: No Slough/Fibrino Yes Wound Bed Granulation Amount: Large (67-100%) Exposed Structure Granulation Quality: Pink, Pale Fascia Exposed: No Necrotic Amount: Small (1-33%) Fat Layer (Subcutaneous Tissue) Exposed: Yes Necrotic Quality: Adherent Slough Tendon Exposed: No Muscle Exposed: No Joint Exposed: No Bone Exposed: No Treatment Notes Wound #3 (Knee) Wound Laterality: Right, Posterior Cleanser Soap and Water Discharge Instruction: May shower and wash wound with dial antibacterial soap and water prior to dressing change. Wound Cleanser Discharge Instruction: Cleanse the wound with wound cleanser prior to applying a clean dressing using gauze sponges, not tissue or cotton balls. Peri-Wound Care Topical Triple Antibiotic Ointment, 1 (oz) Tube Primary Dressing Secondary Dressing Zetuvit Plus Silicone Border Dressing 4x4 (in/in) Discharge Instruction: Apply silicone border over primary dressing as directed. Secured With Compression Wrap Compression Stockings Environmental education officer) Signed: 06/10/2021 6:09:20 PM By: Deon Pilling Entered By: Deon Pilling on 06/10/2021 13:02:27 -------------------------------------------------------------------------------- Vitals Details Patient Name: Date of Service: [name] [name] ND, [name] 06/10/2021 12:30 PM Medical Record Number: [id number] Patient Account Number: [id number] Date of Birth/Sex: Treating RN: 1971/07/31 (50 y.o. [name] Primary Care Tekeshia Klahr: [name] Other Clinician: Referring Lynnel Zanetti: Treating Shela Esses/Extender: Delano Metz in Treatment: 81 Vital Signs Time Taken: 12:52 Temperature (F): 98.4 Height (in): 77 Pulse (bpm): 79 Weight (lbs): 232 Respiratory Rate (breaths/min): 16 Body Mass Index (BMI): 27.5 Blood Pressure (mmHg): 126/75 Capillary Blood Glucose (mg/dl): 52 Reference Range: 80 - 120 mg / dl Electronic Signature(s) Signed: 06/10/2021 3:24:23 PM By: Sandre Kitty Entered By: Sandre Kitty on 06/10/2021 12:53:03

## 2021-06-10 NOTE — Progress Notes (Signed)
ALPHEUS, STIFF ([id number]) Visit Report for 06/10/2021 Chief Complaint Document Details Patient Name: Date of Service: [name] [name] 06/10/2021 12:30 PM Medical Record Number: [id number] Patient Account Number: [id number] Date of Birth/Sex: Treating RN: 1971-04-16 (50 y.o. [name] Primary Care Provider: [name] Other Clinician: Referring Provider: Treating Provider/Extender: Delano Metz in Treatment: 74 Information Obtained from: Patient Chief Complaint patient is here for a review of the wound on his plantar left first metatarsal head and back of his right leg Electronic Signature(s) Signed: 06/10/2021 1:54:34 PM By: Kalman Shan DO Entered By: Kalman Shan on 06/10/2021 13:49:22 -------------------------------------------------------------------------------- Debridement Details Patient Name: Date of Service: [name] [name] [name], [name] 06/10/2021 12:30 PM Medical Record Number: [id number] Patient Account Number: [id number] Date of Birth/Sex: Treating RN: 1971-05-31 (50 y.o. [name], [name] Primary Care Provider: [name] Other Clinician: Referring Provider: Treating Provider/Extender: Delano Metz in Treatment: 42 Debridement Performed for Assessment: Wound #2R Left Metatarsal head first Performed By: Physician Kalman Shan, DO Debridement Type: Debridement Severity of Tissue Pre Debridement: Fat layer exposed Level of Consciousness (Pre-procedure): Awake and Alert Pre-procedure Verification/Time Out Yes - 13:20 Taken: Start Time: 13:21 Pain Control: Lidocaine 4% T opical Solution T Area Debrided (L x W): otal 0.8 (cm) x 0.5 (cm) = 0.4 (cm) Tissue and other material debrided: Viable, Non-Viable, Callus, Subcutaneous, Skin: Dermis , Skin: Epidermis, Fibrin/Exudate Level: Skin/Subcutaneous Tissue Debridement Description: Excisional Instrument: Curette Bleeding:  Minimum Hemostasis Achieved: Pressure End Time: 13:26 Procedural Pain: 0 Post Procedural Pain: 0 Response to Treatment: Procedure was tolerated well Level of Consciousness (Post- Awake and Alert procedure): Post Debridement Measurements of Total Wound Length: (cm) 0.4 Width: (cm) 0.3 Depth: (cm) 0.2 Volume: (cm) 0.019 Character of Wound/Ulcer Post Debridement: Improved Severity of Tissue Post Debridement: Fat layer exposed Post Procedure Diagnosis Same as Pre-procedure Electronic Signature(s) Signed: 06/10/2021 1:54:34 PM By: Kalman Shan DO Signed: 06/10/2021 6:09:20 PM By: Deon Pilling Entered By: Deon Pilling on 06/10/2021 13:27:17 -------------------------------------------------------------------------------- HPI Details Patient Name: Date of Service: [name] [name], [name] 06/10/2021 12:30 PM Medical Record Number: [id number] Patient Account Number: [id number] Date of Birth/Sex: Treating RN: 1971/08/16 (50 y.o. [name] Primary Care Provider: [name] Other Clinician: Referring Provider: Treating Provider/Extender: Delano Metz in Treatment: 76 History of Present Illness HPI Description: 04/21/18 ADMISSION This is a 50 year old man who is a type II diabetic. In spite of fact his hemoglobin A1c is actually quite good 5.73 months ago he is at a really difficult time over the last 6-7 months. He developed a rapidly progressive infection in the right foot in November 2018 associated with osteomyelitis and necrotizing fasciitis. 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 The stump required and a revision on 11/24/17. The stump revision was advertised as being secondary to falls. I'm not sure the progressive history here however this area is actually closed over. The patient tells me over the same timeframe he has had wounds on the plantar aspect of his left foot. In the ED saw Dr. Sharol Given in April of this year. Noted that have Wagner grade 1  diabetic ulcers on the fifth didn't first metatarsal heads. It is really not clear that this patient is been dressing this with anything. He came in with the clinic without any specific dressing on the wound areas using his own tennis shoe. He tells me that he only ambulates of course to do a pivot transfer. He does not  have his prosthesis for the right leg as of yet and he blames Medicaid for this. He does however use the foot to push himself along in his wheelchair at home. The patient has not had formal arterial studies. ABI in our clinic on the left was 1.13. Patient's past medical history includes type 2 diabetes, fracture of the right fibula. I see that he was treated for abscesses on his right buttock and chest in 2016. I did not look at the microbiology of this. 04/28/18; patient comes back in the clinic today with the wound pretty much the same as when he came in here last week. Small opening lots of undermining relatively. He tells [place] that nothing is really been on this for 3 days in spite of the fact that we gave him enough to dress this easily especially such a small wound. He says he lives with his mother, she is not capable of assisting with this he is changing the dressings himself. 05/05/18; much better-looking wound today. Smaller. There is some undermining medially however that's only perhaps 2 mm. No surrounding erythema. We've been using silver collagen 05/12/18; small wound on the first metatarsal head. No undermining no surrounding erythema. We've been using silver collagen he has home health coming out to change 05/20/18 the wound on the first metatarsal head looks better. Covered in surface debris/callus nonviable tissue. Required debridement but post debridement this looks quite good. We've been using silver collagen. He has home health 05/27/18; first metatarsal head wound continues to improve. Just about completely closed. Still a lot of surface debridement callus. We've been  using silver collagen 06/06/18; the first metatarsal wound is completely healed over. There is still a lot of callus. I gently removed some of this just to make sure that there was no open area and there is not. The patient mentions to me that his left leg has felt like "lead" for about a week READMISSION 08/16/2020 This is a 50 year old man with type 2 diabetes. He returns to clinic today with a 1 month history of a blister and callus over the first metatarsal head. This is in exactly the same area as when he was here in 2019. 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 He has standard running shoes on the left foot to match the area in his prosthesis. He has only been covering this area with a Band-Aid has not really been specifically dressing this. ABI in our clinic on the left was 1.16. This is essentially stable from the value in 2019 10/1 the patient has a linear wound over the left first metatarsal head. Again not a lot of callus thick skin around this that I removed with a #3 curette. This is not go deep to bone or muscle it does not look to be infected. 10/8 first metatarsal head. The wound looks like it is closing however this is going to be a difficult area to offload in the future. He has a size 15 shoe and he says his shoes are years old. The inserts in these current shoes are totally useless and I have advised him to replace these. He may need a new pair of shoes and I have he got original prosthesis at [facility] [place] I have asked him to go back there. 11/9; the patient has not been here in more than a month. Not sure he was healed when he was here the last time. I  told him he would have to get new shoes and certainly offload the area over the left first metatarsal head. He has done nothing of the kind. He arrives back in clinic with the same old new balance sneakers. A very large separating callus over the first metatarsal head on the  left 11/16; he has purchased new shoes and has at least 2 insoles like I asked. The wound is measuring much smaller. He is using silver alginate he changes the dressing himself 11/30; wound is measurably smaller in length of about a half a centimeter. This is generally very little depth. We have been using silver alginate. He changes the dressing himself every second day. 12/14; wound is about the same size. Significant undermining medially relative to the size of the wound. We have been using silver alginate. There is not a way to offload this area as he walks with a prosthesis on the right leg 12/28; wound is about the same size. Again he has undermining medially. I am using polymen on this 12/02/2020; the wound looks smaller. Still a senescent edge. We have been using polymen 1/24; the wound is come down a few millimeters. Still a senescent edge. I have been using polymen 2/8; the wound is come down slightly. Still with slight undermining from 12-6 o'clock I have been using polymen partially to get offloading on this area which is opposed by his prosthesis on the other leg. 2/22 quite open improvement he still has a comma shaped wound on the left first metatarsal head. Some depth. Using polymen 3/14; he still has the same problem of a comma shaped area over the left first metatarsal head. This seems to have had more depth today at 0.6 cm. We have been using polymen. The patient changes this himself every second day. I explored the idea of a total contact cast on the left leg however this is his driving foot. He was not enamored with the idea of not being able to drive and states that he does not have anybody else to get him around 3/28; again he comes in with a smaller looking wound however there is depth and undermining requiring debridement. We have been using Hydrofera Blue, changed to silver collagen today. The patient is doing the dressing himself 4/11; patient presents today for follow-up  of his left diabetic foot wound. He denies any issues in the past 2 weeks. He is currently using silver collagen every other day with dressing changes. He does not use diabetic shoes or special inserts to his sneakers. He does not use felt pads for offloading. 4/19; patient presents for his 1 week follow-up. He denies any issues. He reports using Hydrofera Blue and has not been using silver collagen for dressing changes. He reports minimal drainage to the wound. He overall feels well. 5/3; patient presents for 2-week follow-up. He has been using silver collagen without issues. He has no complaints today and states he overall feels well. 5/17; patient presents for 2-week follow-up. He states that the sleeve is rubbing up against the back of his right leg and causing a wound. He states this happened a week ago and has not been dressing the wound. The plantar wound is stable and he has been using collagen every other day. 5/24; Patient was had a recent hospitalization for cellulitis of his right popliteal fossa. He was admitted and started on IV antibiotics. He does not recall the plantar wound dressed while inpatient. He feels a lot better today. He was discharged  on 5/21 on oral antibiotics and has not picked this up until today. He reports using collagen to the plantar wound since discharge. He is keeping the right posterior knee wound covered. He is getting a new sleeve for his prostatic on 5/26. He currently denies signs of infection. 6/1; patient presents for 1 week follow-up. He is still on doxycycline. He has not been dressing the back of the right knee wound. He states he is receiving his new prosthetic sleeve tomorrow. He states he is using silver alginate to the plantar foot wound. He currently denies signs of infection. He states he has not been offloading the left foot wound 6/7; patient presents for 1 week follow-up. He completes his last dose of doxycycline today. He finally received his  prosthetic sleeve for the right side and he states it feels much better. He has been using antibiotic ointment to the back of that right knee wound. He has been using silver alginate to the plantar foot wound. He currently denies signs of infection. 6/21; patient presents for 2-week follow-up. He reports no issues with his prosthetic sleeve. He has been using antibiotic ointment to the back of the right knee. He has been using silver alginate to the plantar foot wound. He has no issues or complaints today. He denies signs of infection. 7/5; patient presents for 2-week follow-up. He reports no issues or complaints today. He continues to use antibiotic ointment to the right posterior knee wound and silver alginate to the left plantar foot wound. He denies signs of infection. 7/19; patient presents for 2-week follow-up. He continues to use antibiotic ointment to the right posterior knee and silver alginate to the left plantar foot wound. He has no issues or complaints today. He denies signs of infection. Electronic Signature(s) Signed: 06/10/2021 1:54:34 PM By: Kalman Shan DO Entered By: Kalman Shan on 06/10/2021 13:49:49 -------------------------------------------------------------------------------- Physical Exam Details Patient Name: Date of Service: [name] [name], [name] 06/10/2021 12:30 PM Medical Record Number: [id number] Patient Account Number: [id number] Date of Birth/Sex: Treating RN: [birth year] (50 y.o. [name] Primary Care Provider: [name] Other Clinician: Referring Provider: Treating Provider/Extender: Delano Metz in Treatment: 58 Constitutional respirations regular, non-labored and within target range for patient.. Cardiovascular 2+ dorsalis pedis/posterior tibialis pulses. Psychiatric pleasant and cooperative. Notes Left first met head: Wound with granulation tissue and nonviable tissue present with circumferential callus.  No signs of infection. Posterior right knee: Wound limited to skin breakdown with no signs of infection. Appears well-healing Electronic Signature(s) Signed: 06/10/2021 1:54:34 PM By: Kalman Shan DO Signed: 06/10/2021 1:54:34 PM By: Kalman Shan DO Entered By: Kalman Shan on 06/10/2021 13:50:35 -------------------------------------------------------------------------------- Physician Orders Details Patient Name: Date of Service: [name] [name], [name] 06/10/2021 12:30 PM Medical Record Number: [id number] Patient Account Number: [id number] Date of Birth/Sex: Treating RN: 02/06/1971 (50 y.o. 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 - Dr. Heber [place] Tuesday Return A Cellular or Tissue Based Products Wound #2R Left Metatarsal head first Cellular or Tissue Based Product Type: - Run  insurance authorization for apligraft to left foot wound. Bathing/ Shower/ Hygiene May shower and wash wound with soap and water. - WITH DRESSING CHANGES. Edema Control - Lymphedema / SCD / Other Elevate legs to the level of the heart or above for 30 minutes daily and/or when sitting, a frequency of: Avoid standing for long periods of time. Wound Treatment Wound #2R - Metatarsal head first Wound Laterality: Left Cleanser: Soap and Water 1 x Per ZOX/09 Days Discharge Instructions: May shower and wash wound with dial antibacterial soap  and water prior to dressing change. Cleanser: Wound Cleanser (Generic) 1 x Per Day/15 Days Discharge Instructions: Cleanse the wound with wound cleanser prior to applying a clean dressing using gauze sponges, not tissue or cotton balls. Peri-Wound Care: Sween Lotion (Moisturizing lotion) 1 x Per Day/15 Days Discharge Instructions: Apply moisturizing lotion as directed Prim Dressing: KerraCel Ag Gelling Fiber Dressing, 4x5 in (silver alginate) 1 x Per Day/15 Days ary Discharge Instructions: Apply silver alginate to wound bed as instructed Secondary Dressing: Woven Gauze Sponges 2x2 in (Generic) 1 x Per Day/15 Days Discharge Instructions: Apply over primary dressing as directed. Secondary Dressing: Optifoam Non-Adhesive Dressing, 4x4 in (Generic) 1 x Per Day/15 Days Discharge Instructions: Apply over primary dressing cut to make foam donut to help offload Secured With: Conforming Stretch Gauze Bandage, Sterile 2x75 (in/in) (Generic) 1 x Per Day/15 Days Discharge Instructions: Secure with stretch gauze as directed. Secured With: 57M Medipore H Soft Cloth Surgical Tape, 2x2 (in/yd) (Generic) 1 x Per Day/15 Days Discharge Instructions: Secure dressing with tape as directed. Wound #3 - Knee Wound Laterality: Right, Posterior Cleanser: Soap and Water 1 x Per UEA/54 Days Discharge Instructions: May shower and wash wound with dial antibacterial soap and water prior to dressing change. Cleanser: Wound Cleanser (Generic) 1 x Per Day/15 Days Discharge Instructions: Cleanse the wound with wound cleanser prior to applying a clean dressing using gauze sponges, not tissue or cotton balls. Topical: Triple Antibiotic Ointment, 1 (oz) Tube 1 x Per Day/15 Days Secondary Dressing: Zetuvit Plus Silicone Border Dressing 4x4 (in/in) 1 x Per Day/15 Days Discharge Instructions: Apply silicone border over primary dressing as directed. Electronic Signature(s) Signed: 06/10/2021 1:54:34 PM By: Kalman Shan DO Entered  By: Kalman Shan on 06/10/2021 13:50:55 -------------------------------------------------------------------------------- Problem List Details Patient Name: Date of Service: [name] [name] [name], [place] 06/10/2021 12:30 PM Medical Record Number: [id number] Patient Account Number: [id number] Date of Birth/Sex: Treating RN: Mar 22, 1971 (50 y.o. [name] Primary Care Provider: [name] Other Clinician: Referring Provider: Treating Provider/Extender: Delano Metz in Treatment: 56 Active Problems ICD-10 Encounter Code Description Active Date MDM Diagnosis E11.621 Type 2 diabetes mellitus with foot ulcer 08/16/2020 No Yes S81.801D Unspecified open wound, right lower leg, subsequent encounter 04/08/2021 No Yes L97.522 Non-pressure chronic ulcer of other part of left foot with fat layer exposed 08/16/2020 No Yes E11.42 Type 2 diabetes mellitus with diabetic polyneuropathy 08/16/2020 No Yes Z89.511 Acquired absence of right leg below knee 08/16/2020 No Yes Inactive Problems Resolved Problems Electronic Signature(s) Signed: 06/10/2021 1:54:34 PM By: Kalman Shan DO Entered By: Kalman Shan on 06/10/2021 13:49:03 -------------------------------------------------------------------------------- Progress Note Details Patient Name: Date of Service: [name] [name], [name] 06/10/2021 12:30 PM Medical Record Number: [id number] Patient Account Number: [id number] Date of Birth/Sex: Treating RN: 1971/09/20 (50 y.o. [name] Primary Care Provider: [name] Other Clinician: Referring Provider: Treating Provider/Extender: Delano Metz in Treatment: 52 Subjective Chief Complaint Information obtained from Patient patient is  here for a review of the wound on his plantar left first metatarsal head and back of his right leg History of Present Illness (HPI) 04/21/18 ADMISSION This is a 50 year old man who is a type II  diabetic. In spite of fact his hemoglobin A1c is actually quite good 5.73 months ago he is at a really difficult time over the last 6-7 months. He developed a rapidly progressive infection in the right foot in November 2018 associated with osteomyelitis and necrotizing fasciitis. 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 The stump required and a revision on 11/24/17. The stump revision was advertised as being secondary to falls. I'm not sure the progressive history here however this area is actually closed over. The patient tells me over the same timeframe he has had wounds on the plantar aspect of his left foot. In the ED saw Dr. Sharol Given in April of this year. 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 It is really not clear that this patient is been dressing this with anything. He came in with the clinic without any specific dressing on the wound areas using his own tennis shoe. He tells me that he only ambulates of course to do a pivot transfer. He does not have his prosthesis for the right leg as of yet and he blames Medicaid for this. He does however use the foot to push himself along in his wheelchair at home. The patient has not had formal arterial studies. ABI in our clinic on the left was 1.13. Patient's past medical history includes type 2 diabetes, fracture of the right fibula. I see that he was treated for abscesses on his right buttock and chest in 2016. I did not look at the microbiology of this. 04/28/18; patient comes back in the clinic today with the wound pretty much the same as when he came in here last week. Small opening lots of undermining relatively. He tells [place] that nothing is really been on this for 3 days in spite of the fact that we gave him enough to dress this easily especially such a small wound. He says he lives with his mother, she is not capable of assisting with this he is changing the dressings himself. 05/05/18; much better-looking  wound today. Smaller. There is some undermining medially however that's only perhaps 2 mm. No surrounding erythema. We've been using silver collagen 05/12/18; small wound on the first metatarsal head. No undermining no surrounding erythema. We've been using silver collagen he has home health coming out to change 05/20/18 the wound on the first metatarsal head looks better. Covered in surface debris/callus nonviable tissue. Required debridement but post debridement this looks quite good. We've been using silver collagen. He has home health 05/27/18; first metatarsal head wound continues to improve. Just about completely closed. Still a lot of surface debridement callus. We've been using silver collagen 06/06/18; the first metatarsal wound is completely healed over. There is still a lot of callus. I gently removed some of this just to make sure that there was no open area and there is not. The patient mentions to me that his left leg has felt like "lead" for about a week READMISSION 08/16/2020 This is a 50 year old man with type 2 diabetes. He returns to clinic today with a 1 month history of a blister and callus over the first metatarsal head. This is in exactly the same area as when he was here in 2019. He has a  right BKA and a prosthesis from a diabetic foot infection on the right in 2018. He has standard running shoes on the left foot to match the area in his prosthesis. He has only been covering this area with a Band-Aid has not really been specifically dressing this. ABI in our clinic on the left was 1.16. This is essentially stable from the value in 2019 10/1 the patient has a linear wound over the left first metatarsal head. Again not a lot of callus thick skin around this that I removed with a #3 curette. This is not go deep to bone or muscle it does not look to be infected. 10/8 first metatarsal head. The wound looks like it is closing however this is going to be a difficult area to offload in  the future. He has a size 15 shoe and he says his shoes are years old. The inserts in these current shoes are totally useless and I have advised him to replace these. He may need a new pair of shoes and I have he got original prosthesis at [facility] [place] [facility] [place] I have asked him to go back there. 11/9; the patient has not been here in more than a month. Not sure he was healed when he was here the last time. I told him he would have to get new shoes and certainly offload the area over the left first metatarsal head. He has done nothing of the kind. He arrives back in clinic with the same old new balance sneakers. A very large separating callus over the first metatarsal head on the left 11/16; he has purchased new shoes and has at least 2 insoles like I asked. The wound is measuring much smaller. He is using silver alginate he changes the dressing himself 11/30; wound is measurably smaller in length of about a half a centimeter. This is generally very little depth. We have been using silver alginate. He changes the dressing himself every second day. 12/14; wound is about the same size. Significant undermining medially relative to the size of the wound. We have been using silver alginate. There is not a way to offload this area as he walks with a prosthesis on the right leg 12/28; wound is about the same size. Again he has undermining medially. I am using polymen on this 12/02/2020; the wound looks smaller. Still a senescent edge. We have been using polymen 1/24; the wound is come down a few millimeters. Still a senescent edge. I have been using polymen 2/8; the wound is come down slightly. Still with slight undermining from 12-6 o'clock I have been using polymen partially to get offloading on this area which is opposed by his prosthesis on the other leg. 2/22 quite open improvement he still has a comma shaped wound on the left first metatarsal head. Some depth. Using polymen 3/14; he still has the same  problem of a comma shaped area over the left first metatarsal head. This seems to have had more depth today at 0.6 cm. We have been using polymen. The patient changes this himself every second day. I explored the idea of a total contact cast on the left leg however this is his driving foot. He was not enamored with the idea of not being able to drive and states that he does not have anybody else to get him around 3/28; again he comes in with a smaller looking wound however there is depth and undermining requiring debridement. We have been using Hydrofera Blue, changed  to silver collagen today. The patient is doing the dressing himself 4/11; patient presents today for follow-up of his left diabetic foot wound. He denies any issues in the past 2 weeks. He is currently using silver collagen every other day with dressing changes. He does not use diabetic shoes or special inserts to his sneakers. He does not use felt pads for offloading. 4/19; patient presents for his 1 week follow-up. He denies any issues. He reports using Hydrofera Blue and has not been using silver collagen for dressing changes. He reports minimal drainage to the wound. He overall feels well. 5/3; patient presents for 2-week follow-up. He has been using silver collagen without issues. He has no complaints today and states he overall feels well. 5/17; patient presents for 2-week follow-up. He states that the sleeve is rubbing up against the back of his right leg and causing a wound. He states this happened a week ago and has not been dressing the wound. The plantar wound is stable and he has been using collagen every other day. 5/24; Patient was had a recent hospitalization for cellulitis of his right popliteal fossa. He was admitted and started on IV antibiotics. He does not recall the plantar wound dressed while inpatient. He feels a lot better today. He was discharged on 5/21 on oral antibiotics and has not picked this up until today.  He reports using collagen to the plantar wound since discharge. He is keeping the right posterior knee wound covered. He is getting a new sleeve for his prostatic on 5/26. He currently denies signs of infection. 6/1; patient presents for 1 week follow-up. He is still on doxycycline. He has not been dressing the back of the right knee wound. He states he is receiving his new prosthetic sleeve tomorrow. He states he is using silver alginate to the plantar foot wound. He currently denies signs of infection. He states he has not been offloading the left foot wound 6/7; patient presents for 1 week follow-up. He completes his last dose of doxycycline today. He finally received his prosthetic sleeve for the right side and he states it feels much better. He has been using antibiotic ointment to the back of that right knee wound. He has been using silver alginate to the plantar foot wound. He currently denies signs of infection. 6/21; patient presents for 2-week follow-up. He reports no issues with his prosthetic sleeve. He has been using antibiotic ointment to the back of the right knee. He has been using silver alginate to the plantar foot wound. He has no issues or complaints today. He denies signs of infection. 7/5; patient presents for 2-week follow-up. He reports no issues or complaints today. He continues to use antibiotic ointment to the right posterior knee wound and silver alginate to the left plantar foot wound. He denies signs of infection. 7/19; patient presents for 2-week follow-up. He continues to use antibiotic ointment to the right posterior knee and silver alginate to the left plantar foot wound. He has no issues or complaints today. He denies signs of infection. Patient History Information obtained from Patient. Family History Diabetes - Maternal Grandparents,Mother, Heart Disease - Father, Hypertension - Father, No family history of Cancer, Hereditary Spherocytosis, Kidney Disease, Lung  Disease, Seizures, Stroke, Thyroid Problems, Tuberculosis. Social History Current every day smoker, Marital Status - Single, Alcohol Use - Never, Drug Use - No History, Caffeine Use - Never. Medical History Eyes Denies history of Cataracts, Glaucoma, Optic Neuritis Ear/Nose/Mouth/Throat Denies history of Chronic sinus problems/congestion,  Middle ear problems Hematologic/Lymphatic Patient has history of Anemia Denies history of Hemophilia, Human Immunodeficiency Virus, Lymphedema, Sickle Cell Disease Respiratory Denies history of Aspiration, Asthma, Chronic Obstructive Pulmonary Disease (COPD), Pneumothorax, Sleep Apnea, Tuberculosis Cardiovascular Patient has history of Hypertension, Peripheral Venous Disease Denies history of Angina, Arrhythmia, Congestive Heart Failure, Coronary Artery Disease, Deep Vein Thrombosis, Hypotension, Myocardial Infarction, Peripheral Arterial Disease, Phlebitis, Vasculitis Gastrointestinal Denies history of Cirrhosis , Colitis, Crohnoos, Hepatitis A, Hepatitis B, Hepatitis C Endocrine Patient has history of Type II Diabetes - oral meds Denies history of Type I Diabetes Genitourinary Denies history of End Stage Renal Disease Immunological Denies history of Lupus Erythematosus, Raynaudoos, Scleroderma Integumentary (Skin) Denies history of History of Burn Musculoskeletal Denies history of Gout, Rheumatoid Arthritis, Osteoarthritis, Osteomyelitis Neurologic Denies history of Dementia, Neuropathy, Quadriplegia, Paraplegia, Seizure Disorder Oncologic Denies history of Received Chemotherapy, Received Radiation Psychiatric Denies history of Anorexia/bulimia, Confinement Anxiety Hospitalization/Surgery History - fever 105, sepsis cellulitis right popliteal fossa 04/09/2021. Medical A Surgical History Notes [name] Constitutional Symptoms (General Health) falls , leukocytosis Respiratory During waking hours and catches himself not breathing, "gasp for air".  Saw Dr Wynonia Lawman, he couldn't find a reason Objective Constitutional respirations regular, non-labored and within target range for patient.. Vitals Time Taken: 12:52 PM, Height: 77 in, Weight: 232 lbs, BMI: 27.5, Temperature: 98.4 F, Pulse: 79 bpm, Respiratory Rate: 16 breaths/min, Blood Pressure: 126/75 mmHg, Capillary Blood Glucose: 52 mg/dl. Cardiovascular 2+ dorsalis pedis/posterior tibialis pulses. Psychiatric pleasant and cooperative. General Notes: Left first met head: Wound with granulation tissue and nonviable tissue present with circumferential callus. No signs of infection. Posterior right knee: Wound limited to skin breakdown with no signs of infection. Appears well-healing Integumentary (Hair, Skin) Wound #2R status is Open. Original cause of wound was Blister. The date acquired was: 06/23/2020. The wound has been in treatment 42 weeks. The wound is located on the Left Metatarsal head first. The wound measures 0.4cm length x 0.3cm width x 0.2cm depth; 0.094cm^2 area and 0.019cm^3 volume. There is Fat Layer (Subcutaneous Tissue) exposed. There is no tunneling noted, however, there is undermining starting at 12:00 and ending at 12:00 with a maximum distance of 0.4cm. There is a medium amount of serosanguineous drainage noted. The wound margin is thickened. There is large (67-100%) pink granulation within the wound bed. There is no necrotic tissue within the wound bed. General Notes: callous periwound. Wound #3 status is Open. Original cause of wound was Pressure Injury. The date acquired was: 03/31/2021. The wound has been in treatment 9 weeks. The wound is located on the Right,Posterior Knee. The wound measures 0.2cm length x 0.2cm width x 0.1cm depth; 0.031cm^2 area and 0.003cm^3 volume. There is Fat Layer (Subcutaneous Tissue) exposed. There is no tunneling or undermining noted. There is a medium amount of serosanguineous drainage noted. The wound margin is flat and intact. There is  large (67-100%) pink, pale granulation within the wound bed. There is a small (1-33%) amount of necrotic tissue within the wound bed including Adherent Slough. 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 I recommended continuing antibiotic ointment and keeping the area covered to his right posterior knee. This area is almost closed. T the left plantar met head I debrided nonviable tissue and I recommended continuing silver  alginate. At this time I would like to see if the patient o would be approved for skin substitute and he was agreeable. Procedures Wound #2R Pre-procedure diagnosis of Wound #2R is a Diabetic Wound/Ulcer of the Lower Extremity located on the Left Metatarsal head first .Severity of Tissue Pre Debridement is: Fat layer exposed. There was a Excisional Skin/Subcutaneous Tissue Debridement with a total area of 0.4 sq cm performed by Kalman Shan, DO. With the following instrument(s): Curette to remove Viable and Non-Viable tissue/material. Material removed includes Callus, Subcutaneous Tissue, Skin: Dermis, Skin: Epidermis, and Fibrin/Exudate after achieving pain control using Lidocaine 4% Topical Solution. A time out was conducted at 13:20, prior to the start of the procedure. A Minimum amount of bleeding was controlled with Pressure. The procedure was tolerated well with a pain level of 0 throughout and a pain level of 0 following the procedure. Post Debridement Measurements: 0.4cm length x 0.3cm width x 0.2cm depth; 0.019cm^3 volume. Character of Wound/Ulcer Post Debridement is improved. Severity of Tissue Post Debridement is: Fat layer exposed. Post procedure Diagnosis Wound #2R:  Same as Pre-Procedure Plan Follow-up Appointments: Return Appointment in 2 weeks. - Dr. Heber Audubon Tuesday Cellular or Tissue Based Products: Wound #2R Left Metatarsal head first: Cellular or Tissue Based Product Type: - Run insurance authorization for apligraft to left foot wound. Bathing/ Shower/ Hygiene: May shower and wash wound with soap and water. - WITH DRESSING CHANGES. Edema Control - Lymphedema / SCD / Other: Elevate legs to the level of the heart or above for 30 minutes daily and/or when sitting, a frequency of: Avoid standing for long periods of time. WOUND #2R: - Metatarsal head first Wound Laterality: Left Cleanser: Soap and Water 1 x Per Day/15 Days Discharge Instructions: May shower and wash wound with dial antibacterial soap and water prior to dressing change. Cleanser: Wound Cleanser (Generic) 1 x Per Day/15 Days Discharge Instructions: Cleanse the wound with wound cleanser prior to applying a clean dressing using gauze sponges, not tissue or cotton balls. Peri-Wound Care: Sween Lotion (Moisturizing lotion) 1 x Per Day/15 Days Discharge Instructions: Apply moisturizing lotion as directed Prim Dressing: KerraCel Ag Gelling Fiber Dressing, 4x5 in (silver alginate) 1 x Per Day/15 Days ary Discharge Instructions: Apply silver alginate to wound bed as instructed Secondary Dressing: Woven Gauze Sponges 2x2 in (Generic) 1 x Per Day/15 Days Discharge Instructions: Apply over primary dressing as directed. Secondary Dressing: Optifoam Non-Adhesive Dressing, 4x4 in (Generic) 1 x Per Day/15 Days Discharge Instructions: Apply over primary dressing cut to make foam donut to help offload Secured With: Conforming Stretch Gauze Bandage, Sterile 2x75 (in/in) (Generic) 1 x Per Day/15 Days Discharge Instructions: Secure with stretch gauze as directed. Secured With: 40M Medipore H Soft Cloth Surgical T ape, 2x2 (in/yd) (Generic) 1 x Per Day/15 Days Discharge Instructions: Secure dressing with  tape as directed. WOUND #3: - Knee Wound Laterality: Right, Posterior Cleanser: Soap and Water 1 x Per TAV/69 Days Discharge Instructions: May shower and wash wound with dial antibacterial soap and water prior to dressing change. Cleanser: Wound Cleanser (Generic) 1 x Per Day/15 Days Discharge Instructions: Cleanse the wound with wound cleanser prior to applying a clean dressing using gauze sponges, not tissue or cotton balls. Topical: Triple Antibiotic Ointment, 1 (oz) Tube 1 x Per Day/15 Days Secondary Dressing: Zetuvit Plus Silicone Border Dressing 4x4 (in/in) 1 x Per Day/15 Days Discharge Instructions: Apply silicone border over primary dressing as directed. 1. In office sharp debridement 2. Antibiotic ointment and cover to  the right posterior knee 3. Silver alginate to the left foot wound 4. Run Apligraf through insurance 5. Follow-up in 2 weeks Electronic Signature(s) Signed: 06/10/2021 1:54:34 PM By: Kalman Shan DO Entered By: Kalman Shan on 06/10/2021 13:53:56 -------------------------------------------------------------------------------- HxROS Details Patient Name: Date of Service: [name] [name], [name] 06/10/2021 12:30 PM Medical Record Number: [id number] Patient Account Number: [id number] Date of Birth/Sex: Treating RN: [birth year] (50 y.o. [name] Primary Care Provider: [name] Other Clinician: Referring Provider: Treating Provider/Extender: Delano Metz in Treatment: 4 Information Obtained From Patient Constitutional Symptoms (General Health) Medical History: Past Medical History Notes: falls , leukocytosis Eyes Medical History: Negative for: Cataracts; Glaucoma; Optic Neuritis Ear/Nose/Mouth/Throat Medical History: Negative for: Chronic sinus problems/congestion; Middle ear problems Hematologic/Lymphatic Medical History: Positive for: Anemia Negative for: Hemophilia; Human Immunodeficiency Virus; Lymphedema;  Sickle Cell Disease Respiratory Medical History: Negative for: Aspiration; Asthma; Chronic Obstructive Pulmonary Disease (COPD); Pneumothorax; Sleep Apnea; Tuberculosis Past Medical History Notes: During waking hours and catches himself not breathing, "gasp for air". Saw Dr Wynonia Lawman, he couldn't find a reason Cardiovascular Medical History: Positive for: Hypertension; Peripheral Venous Disease Negative for: Angina; Arrhythmia; Congestive Heart Failure; Coronary Artery Disease; Deep Vein Thrombosis; Hypotension; Myocardial Infarction; Peripheral Arterial Disease; Phlebitis; Vasculitis Gastrointestinal Medical History: Negative for: Cirrhosis ; Colitis; Crohns; Hepatitis A; Hepatitis B; Hepatitis C Endocrine Medical History: Positive for: Type II Diabetes - oral meds Negative for: Type I Diabetes Time with diabetes: 2014 Treated with: Oral agents Blood sugar tested every day: Yes Tested : Blood sugar testing results: Breakfast: 91 Genitourinary Medical History: Negative for: End Stage Renal Disease Immunological Medical History: Negative for: Lupus Erythematosus; Raynauds; Scleroderma Integumentary (Skin) Medical History: Negative for: History of Burn Musculoskeletal Medical History: Negative for: Gout; Rheumatoid Arthritis; Osteoarthritis; Osteomyelitis Neurologic Medical History: Negative for: Dementia; Neuropathy; Quadriplegia; Paraplegia; Seizure Disorder Oncologic Medical History: Negative for: Received Chemotherapy; Received Radiation Psychiatric Medical History: Negative for: Anorexia/bulimia; Confinement Anxiety Immunizations Pneumococcal Vaccine: Received Pneumococcal Vaccination: No Implantable Devices No devices added Hospitalization / Surgery History Type of Hospitalization/Surgery fever 105, sepsis cellulitis right popliteal fossa 04/09/2021 Family and Social History Cancer: No; Diabetes: Yes - Maternal Grandparents,Mother; Heart Disease: Yes - Father;  Hereditary Spherocytosis: No; Hypertension: Yes - Father; Kidney Disease: No; Lung Disease: No; Seizures: No; Stroke: No; Thyroid Problems: No; Tuberculosis: No; Current every day smoker; Marital Status - Single; Alcohol Use: Never; Drug Use: No History; Caffeine Use: Never; Financial Concerns: No; Food, Clothing or Shelter Needs: No; Support System Lacking: No; Transportation Concerns: No Electronic Signature(s) Signed: 06/10/2021 1:54:34 PM By: Kalman Shan DO Signed: 06/10/2021 6:09:20 PM By: Deon Pilling Entered By: Kalman Shan on 06/10/2021 13:50:00 -------------------------------------------------------------------------------- SuperBill Details Patient Name: Date of Service: [name] [name], [name] 06/10/2021 Medical Record Number: [id number] Patient Account Number: [id number] Date of Birth/Sex: Treating RN: Feb 15, 1971 (50 y.o. [name], [name] Primary Care Provider: [name] Other Clinician: Referring Provider: Treating Provider/Extender: Delano Metz in Treatment: 14 Diagnosis Coding ICD-10 Codes Code Description E11.621 Type 2 diabetes mellitus with foot ulcer S81.801D Unspecified open wound, right lower leg, subsequent encounter L97.522 Non-pressure chronic ulcer of other part of left foot with fat layer exposed E11.42 Type 2 diabetes mellitus with diabetic polyneuropathy Z89.511 Acquired absence of right leg below knee Facility Procedures CPT4 Code: [id number] Description: 97026 - DEB SUBQ TISSUE 20 SQ CM/< ICD-10 Diagnosis Description L97.522 Non-pressure chronic ulcer of other part of left foot with fat layer exposed Modifier: Quantity: 1 Physician Procedures :  CPT4 Code Description Modifier [id number] - WC PHYS SUBQ TISS 20 SQ CM ICD-10 Diagnosis Description L97.522 Non-pressure chronic ulcer of other part of left foot with fat layer exposed Quantity: 1 Electronic Signature(s) Signed: 06/10/2021 1:54:34 PM By: Kalman Shan DO Entered By: Kalman Shan on 06/10/2021 13:54:02

## 2021-06-24 ENCOUNTER — Encounter (HOSPITAL_BASED_OUTPATIENT_CLINIC_OR_DEPARTMENT_OTHER): Payer: Medicare Other | Admitting: Internal Medicine

## 2021-06-26 ENCOUNTER — Other Ambulatory Visit: Payer: Self-pay

## 2021-06-26 ENCOUNTER — Encounter (HOSPITAL_BASED_OUTPATIENT_CLINIC_OR_DEPARTMENT_OTHER): Payer: Medicare Other | Attending: Internal Medicine | Admitting: Internal Medicine

## 2021-06-26 DIAGNOSIS — L97522 Non-pressure chronic ulcer of other part of left foot with fat layer exposed: Secondary | ICD-10-CM | POA: Insufficient documentation

## 2021-06-26 DIAGNOSIS — Z833 Family history of diabetes mellitus: Secondary | ICD-10-CM | POA: Insufficient documentation

## 2021-06-26 DIAGNOSIS — E1151 Type 2 diabetes mellitus with diabetic peripheral angiopathy without gangrene: Secondary | ICD-10-CM | POA: Diagnosis not present

## 2021-06-26 DIAGNOSIS — Z89511 Acquired absence of right leg below knee: Secondary | ICD-10-CM | POA: Diagnosis not present

## 2021-06-26 DIAGNOSIS — F17200 Nicotine dependence, unspecified, uncomplicated: Secondary | ICD-10-CM | POA: Diagnosis not present

## 2021-06-26 DIAGNOSIS — E1142 Type 2 diabetes mellitus with diabetic polyneuropathy: Secondary | ICD-10-CM | POA: Insufficient documentation

## 2021-06-26 DIAGNOSIS — E11621 Type 2 diabetes mellitus with foot ulcer: Secondary | ICD-10-CM | POA: Insufficient documentation

## 2021-06-26 DIAGNOSIS — Z8249 Family history of ischemic heart disease and other diseases of the circulatory system: Secondary | ICD-10-CM | POA: Diagnosis not present

## 2021-06-27 NOTE — Progress Notes (Signed)
DARL, KUSS ([id number]) Visit Report for 06/26/2021 Chief Complaint Document Details Patient Name: Date of Service: [name] [name] 06/26/2021 12:30 PM Medical Record Number: [id number] Patient Account Number: [id number] Date of Birth/Sex: Treating RN: [birth year] (51 y.o. [name] Primary Care Provider: [name] Other Clinician: Referring Provider: Treating Provider/Extender: Delano Metz in Treatment: 13 Information Obtained from: Patient Chief Complaint patient is here for a review of the wound on his plantar left first metatarsal head and back of his right leg Electronic Signature(s) Signed: 06/26/2021 1:07:12 PM By: Kalman Shan DO Entered By: Kalman Shan on 06/26/2021 13:03:25 -------------------------------------------------------------------------------- Debridement Details Patient Name: Date of Service: [name] [name] [name], [name] 06/26/2021 12:30 PM Medical Record Number: [id number] Patient Account Number: [id number] Date of Birth/Sex: Treating RN: January 19, 1971 (51 y.o. [name], [name] Primary Care Provider: [name] Other Clinician: Referring Provider: Treating Provider/Extender: Delano Metz in Treatment: 59 Debridement Performed for Assessment: Wound #2R Left Metatarsal head first Performed By: Physician Kalman Shan, DO Debridement Type: Debridement Severity of Tissue Pre Debridement: Fat layer exposed Level of Consciousness (Pre-procedure): Awake and Alert Pre-procedure Verification/Time Out Yes - 12:50 Taken: Start Time: 12:51 Pain Control: Lidocaine 4% T opical Solution T Area Debrided (L x W): otal 0.8 (cm) x 0.8 (cm) = 0.64 (cm) Tissue and other material debrided: Viable, Non-Viable, Callus, Slough, Subcutaneous, Skin: Dermis , Skin: Epidermis, Fibrin/Exudate, Slough Level: Skin/Subcutaneous Tissue Debridement Description: Excisional Instrument: Curette Bleeding:  Minimum Hemostasis Achieved: Pressure End Time: 12:59 Procedural Pain: 0 Post Procedural Pain: 0 Response to Treatment: Procedure was tolerated well Level of Consciousness (Post- Awake and Alert procedure): Post Debridement Measurements of Total Wound Length: (cm) 0.5 Width: (cm) 0.4 Depth: (cm) 0.3 Volume: (cm) 0.047 Character of Wound/Ulcer Post Debridement: Requires Further Debridement Severity of Tissue Post Debridement: Fat layer exposed Post Procedure Diagnosis Same as Pre-procedure Electronic Signature(s) Signed: 06/26/2021 1:07:12 PM By: Kalman Shan DO Signed: 06/26/2021 6:14:36 PM By: Deon Pilling Entered By: Deon Pilling on 06/26/2021 12:59:55 -------------------------------------------------------------------------------- HPI Details Patient Name: Date of Service: [name] [name], [name] 06/26/2021 12:30 PM Medical Record Number: [id number] Patient Account Number: [id number] Date of Birth/Sex: Treating RN: [birth year] (50 y.o. [name] Primary Care Provider: [name] Other Clinician: Referring Provider: Treating Provider/Extender: Delano Metz in Treatment: 53 History of Present Illness HPI Description: 04/21/18 ADMISSION This is a 50 year old man who is a type II diabetic. In spite of fact his hemoglobin A1c is actually quite good 5.73 months ago he is at a really difficult time over the last 6-7 months. He developed a rapidly progressive infection in the right foot in November 2018 associated with osteomyelitis and necrotizing fasciitis. 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 The stump required and a revision on 11/24/17. The stump revision was advertised as being secondary to falls. I'm not sure the progressive history here however this area is actually closed over. The patient tells me over the same timeframe he has had wounds on the plantar aspect of his left foot. In the ED saw Dr. Sharol Given in April of this year. Noted that have  Wagner grade 1 diabetic ulcers on the fifth didn't first metatarsal heads. It is really not clear that this patient is been dressing this with anything. He came in with the clinic without any specific dressing on the wound areas using his own tennis shoe. He tells me that he only ambulates of course to do a pivot  transfer. He does not have his prosthesis for the right leg as of yet and he blames Medicaid for this. He does however use the foot to push himself along in his wheelchair at home. The patient has not had formal arterial studies. ABI in our clinic on the left was 1.13. Patient's past medical history includes type 2 diabetes, fracture of the right fibula. I see that he was treated for abscesses on his right buttock and chest in 2016. I did not look at the microbiology of this. 04/28/18; patient comes back in the clinic today with the wound pretty much the same as when he came in here last week. Small opening lots of undermining relatively. He tells [place] that nothing is really been on this for 3 days in spite of the fact that we gave him enough to dress this easily especially such a small wound. He says he lives with his mother, she is not capable of assisting with this he is changing the dressings himself. 05/05/18; much better-looking wound today. Smaller. There is some undermining medially however that's only perhaps 2 mm. No surrounding erythema. We've been using silver collagen 05/12/18; small wound on the first metatarsal head. No undermining no surrounding erythema. We've been using silver collagen he has home health coming out to change 05/20/18 the wound on the first metatarsal head looks better. Covered in surface debris/callus nonviable tissue. Required debridement but post debridement this looks quite good. We've been using silver collagen. He has home health 05/27/18; first metatarsal head wound continues to improve. Just about completely closed. Still a lot of surface debridement callus.  We've been using silver collagen 06/06/18; the first metatarsal wound is completely healed over. There is still a lot of callus. I gently removed some of this just to make sure that there was no open area and there is not. The patient mentions to me that his left leg has felt like "lead" for about a week READMISSION 08/16/2020 This is a 50 year old man with type 2 diabetes. He returns to clinic today with a 1 month history of a blister and callus over the first metatarsal head. This is in exactly the same area as when he was here in 2019. 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 He has standard running shoes on the left foot to match the area in his prosthesis. He has only been covering this area with a Band-Aid has not really been specifically dressing this. ABI in our clinic on the left was 1.16. This is essentially stable from the value in 2019 10/1 the patient has a linear wound over the left first metatarsal head. Again not a lot of callus thick skin around this that I removed with a #3 curette. This is not go deep to bone or muscle it does not look to be infected. 10/8 first metatarsal head. The wound looks like it is closing however this is going to be a difficult area to offload in the future. He has a size 15 shoe and he says his shoes are years old. The inserts in these current shoes are totally useless and I have advised him to replace these. He may need a new pair of shoes and I have he got original prosthesis at [facility] [place] I have asked him to go back there. 11/9; the patient has not been here in more than a month. Not sure he was healed when he was here  the last time. I told him he would have to get new shoes and certainly offload the area over the left first metatarsal head. He has done nothing of the kind. He arrives back in clinic with the same old new balance sneakers. A very large separating callus over the first metatarsal  head on the left 11/16; he has purchased new shoes and has at least 2 insoles like I asked. The wound is measuring much smaller. He is using silver alginate he changes the dressing himself 11/30; wound is measurably smaller in length of about a half a centimeter. This is generally very little depth. We have been using silver alginate. He changes the dressing himself every second day. 12/14; wound is about the same size. Significant undermining medially relative to the size of the wound. We have been using silver alginate. There is not a way to offload this area as he walks with a prosthesis on the right leg 12/28; wound is about the same size. Again he has undermining medially. I am using polymen on this 12/02/2020; the wound looks smaller. Still a senescent edge. We have been using polymen 1/24; the wound is come down a few millimeters. Still a senescent edge. I have been using polymen 2/8; the wound is come down slightly. Still with slight undermining from 12-6 o'clock I have been using polymen partially to get offloading on this area which is opposed by his prosthesis on the other leg. 2/22 quite open improvement he still has a comma shaped wound on the left first metatarsal head. Some depth. Using polymen 3/14; he still has the same problem of a comma shaped area over the left first metatarsal head. This seems to have had more depth today at 0.6 cm. We have been using polymen. The patient changes this himself every second day. I explored the idea of a total contact cast on the left leg however this is his driving foot. He was not enamored with the idea of not being able to drive and states that he does not have anybody else to get him around 3/28; again he comes in with a smaller looking wound however there is depth and undermining requiring debridement. We have been using Hydrofera Blue, changed to silver collagen today. The patient is doing the dressing himself 4/11; patient presents today  for follow-up of his left diabetic foot wound. He denies any issues in the past 2 weeks. He is currently using silver collagen every other day with dressing changes. He does not use diabetic shoes or special inserts to his sneakers. He does not use felt pads for offloading. 4/19; patient presents for his 1 week follow-up. He denies any issues. He reports using Hydrofera Blue and has not been using silver collagen for dressing changes. He reports minimal drainage to the wound. He overall feels well. 5/3; patient presents for 2-week follow-up. He has been using silver collagen without issues. He has no complaints today and states he overall feels well. 5/17; patient presents for 2-week follow-up. He states that the sleeve is rubbing up against the back of his right leg and causing a wound. He states this happened a week ago and has not been dressing the wound. The plantar wound is stable and he has been using collagen every other day. 5/24; Patient was had a recent hospitalization for cellulitis of his right popliteal fossa. He was admitted and started on IV antibiotics. He does not recall the plantar wound dressed while inpatient. He feels a lot better  today. He was discharged on 5/21 on oral antibiotics and has not picked this up until today. He reports using collagen to the plantar wound since discharge. He is keeping the right posterior knee wound covered. He is getting a new sleeve for his prostatic on 5/26. He currently denies signs of infection. 6/1; patient presents for 1 week follow-up. He is still on doxycycline. He has not been dressing the back of the right knee wound. He states he is receiving his new prosthetic sleeve tomorrow. He states he is using silver alginate to the plantar foot wound. He currently denies signs of infection. He states he has not been offloading the left foot wound 6/7; patient presents for 1 week follow-up. He completes his last dose of doxycycline today. He finally  received his prosthetic sleeve for the right side and he states it feels much better. He has been using antibiotic ointment to the back of that right knee wound. He has been using silver alginate to the plantar foot wound. He currently denies signs of infection. 6/21; patient presents for 2-week follow-up. He reports no issues with his prosthetic sleeve. He has been using antibiotic ointment to the back of the right knee. He has been using silver alginate to the plantar foot wound. He has no issues or complaints today. He denies signs of infection. 7/5; patient presents for 2-week follow-up. He reports no issues or complaints today. He continues to use antibiotic ointment to the right posterior knee wound and silver alginate to the left plantar foot wound. He denies signs of infection. 7/19; patient presents for 2-week follow-up. He continues to use antibiotic ointment to the right posterior knee and silver alginate to the left plantar foot wound. He has no issues or complaints today. He denies signs of infection. 8/4; patient presents for 2-week follow-up. He has no issues or complaints today. He denies signs of infection. Electronic Signature(s) Signed: 06/26/2021 1:07:12 PM By: Kalman Shan DO Entered By: Kalman Shan on 06/26/2021 13:04:21 -------------------------------------------------------------------------------- Physical Exam Details Patient Name: Date of Service: [name] [name], [name] 06/26/2021 12:30 PM Medical Record Number: [id number] Patient Account Number: [id number] Date of Birth/Sex: Treating RN: January 04, 1971 (50 y.o. [name] Primary Care Provider: [name] Other Clinician: Referring Provider: Treating Provider/Extender: Delano Metz in Treatment: 66 Constitutional respirations regular, non-labored and within target range for patient.. Cardiovascular 2+ dorsalis pedis/posterior tibialis pulses. Psychiatric pleasant and  cooperative. Notes Left first met head: Wound with granulation tissue and nonviable tissue present with circumferential callus. No signs of infection. Posterior right knee: Wound limited to skin breakdown with no signs of infection. Appears well-healing Electronic Signature(s) Signed: 06/26/2021 1:07:12 PM By: Kalman Shan DO Entered By: Kalman Shan on 06/26/2021 13:04:50 -------------------------------------------------------------------------------- Physician Orders Details Patient Name: Date of Service: [name] [name], [name] 06/26/2021 12:30 PM Medical Record Number: [id number] Patient Account Number: [id number] Date of Birth/Sex: Treating RN: [birth year] (50 y.o. 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 - Dr. Heber [place] Tuesday 07/08/2021 Return A Cellular or Tissue Based  Products Wound #2R Left Metatarsal head first Cellular or Tissue Based Product Type: - wound center to order apligaft for 07/08/2021 to be applied. Bathing/ Shower/ Hygiene May shower and wash wound with soap and water. - WITH DRESSING CHANGES. Edema Control - Lymphedema / SCD / Other Elevate legs to the level of the heart or above for 30 minutes daily and/or when sitting, a frequency of: Avoid standing for long periods of time. Wound Treatment Wound #2R - Metatarsal head first Wound Laterality: Left Cleanser: Soap and Water 1 x Per WJX/91  Days Discharge Instructions: May shower and wash wound with dial antibacterial soap and water prior to dressing change. Cleanser: Wound Cleanser (Generic) 1 x Per Day/15 Days Discharge Instructions: Cleanse the wound with wound cleanser prior to applying a clean dressing using gauze sponges, not tissue or cotton balls. Peri-Wound Care: Sween Lotion (Moisturizing lotion) 1 x Per Day/15 Days Discharge Instructions: Apply moisturizing lotion as directed Prim Dressing: KerraCel Ag Gelling Fiber Dressing, 4x5 in (silver alginate) 1 x Per Day/15 Days ary Discharge Instructions: Apply silver alginate to wound bed as instructed Secondary Dressing: Woven Gauze Sponges 2x2 in (Generic) 1 x Per Day/15 Days Discharge Instructions: Apply over primary dressing as directed. Secondary Dressing: Optifoam Non-Adhesive Dressing, 4x4 in (Generic) 1 x Per Day/15 Days Discharge Instructions: Apply over primary dressing cut to make foam donut to help offload Secured With: Conforming Stretch Gauze Bandage, Sterile 2x75 (in/in) (Generic) 1 x Per Day/15 Days Discharge Instructions: Secure with stretch gauze as directed. Secured With: 77M Medipore H Soft Cloth Surgical Tape, 2x2 (in/yd) (Generic) 1 x Per Day/15 Days Discharge Instructions: Secure dressing with tape as directed. Wound #3 - Knee Wound Laterality: Right, Posterior Cleanser: Soap and Water 1 x Per YNW/29 Days Discharge Instructions: May shower and wash wound with dial antibacterial soap and water prior to dressing change. Cleanser: Wound Cleanser (Generic) 1 x Per Day/15 Days Discharge Instructions: Cleanse the wound with wound cleanser prior to applying a clean dressing using gauze sponges, not tissue or cotton balls. Topical: Triple Antibiotic Ointment, 1 (oz) Tube 1 x Per Day/15 Days Secondary Dressing: Zetuvit Plus Silicone Border Dressing 4x4 (in/in) 1 x Per Day/15 Days Discharge Instructions: Apply silicone border over primary dressing as  directed. Electronic Signature(s) Signed: 06/26/2021 1:07:12 PM By: Kalman Shan DO Entered By: Kalman Shan on 06/26/2021 13:05:17 -------------------------------------------------------------------------------- Problem List Details Patient Name: Date of Service: [name] [name] [name], [name] 06/26/2021 12:30 PM Medical Record Number: [id number] Patient Account Number: [id number] Date of Birth/Sex: Treating RN: March 05, 1971 (50 y.o. [name] Primary Care Provider: [name] Other Clinician: Referring Provider: Treating Provider/Extender: Delano Metz in Treatment: 89 Active Problems ICD-10 Encounter Code Description Active Date MDM Diagnosis E11.621 Type 2 diabetes mellitus with foot ulcer 08/16/2020 No Yes S81.801D Unspecified open wound, right lower leg, subsequent encounter 04/08/2021 No Yes L97.522 Non-pressure chronic ulcer of other part of left foot with fat layer exposed 08/16/2020 No Yes E11.42 Type 2 diabetes mellitus with diabetic polyneuropathy 08/16/2020 No Yes Z89.511 Acquired absence of right leg below knee 08/16/2020 No Yes Inactive Problems Resolved Problems Electronic Signature(s) Signed: 06/26/2021 1:07:12 PM By: Kalman Shan DO Entered By: Kalman Shan on 06/26/2021 13:02:59 -------------------------------------------------------------------------------- Progress Note Details Patient Name: Date of Service: [name] [name], [name] 06/26/2021 12:30 PM Medical Record Number: [id number] Patient Account Number: [id number] Date of Birth/Sex: Treating RN: 09-08-1971 (50 y.o. [name] Primary Care Provider: [name] Other Clinician: Referring Provider: Treating Provider/Extender: Heber [place]  Harriet [place], Milon Dikes in Treatment: 44 Subjective Chief Complaint Information obtained from Patient patient is here for a review of the wound on his plantar left first metatarsal head and back of his right leg History  of Present Illness (HPI) 04/21/18 ADMISSION This is a 50 year old man who is a type II diabetic. In spite of fact his hemoglobin A1c is actually quite good 5.73 months ago he is at a really difficult time over the last 6-7 months. He developed a rapidly progressive infection in the right foot in November 2018 associated with osteomyelitis and necrotizing fasciitis. 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 The stump required and a revision on 11/24/17. The stump revision was advertised as being secondary to falls. I'm not sure the progressive history here however this area is actually closed over. The patient tells me over the same timeframe he has had wounds on the plantar aspect of his left foot. In the ED saw Dr. Sharol Given in April of this year. 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 It is really not clear that this patient is been dressing this with anything. He came in with the clinic without any specific dressing on the wound areas using his own tennis shoe. He tells me that he only ambulates of course to do a pivot transfer. He does not have his prosthesis for the right leg as of yet and he blames Medicaid for this. He does however use the foot to push himself along in his wheelchair at home. The patient has not had formal arterial studies. ABI in our clinic on the left was 1.13. Patient's past medical history includes type 2 diabetes, fracture of the right fibula. I see that he was treated for abscesses on his right buttock and chest in 2016. I did not look at the microbiology of this. 04/28/18; patient comes back in the clinic today with the wound pretty much the same as when he came in here last week. Small opening lots of undermining relatively. He tells [place] that nothing is really been on this for 3 days in spite of the fact that we gave him enough to dress this easily especially such a small wound. He says he lives with his mother, she is not capable of  assisting with this he is changing the dressings himself. 05/05/18; much better-looking wound today. Smaller. There is some undermining medially however that's only perhaps 2 mm. No surrounding erythema. We've been using silver collagen 05/12/18; small wound on the first metatarsal head. No undermining no surrounding erythema. We've been using silver collagen he has home health coming out to change 05/20/18 the wound on the first metatarsal head looks better. Covered in surface debris/callus nonviable tissue. Required debridement but post debridement this looks quite good. We've been using silver collagen. He has home health 05/27/18; first metatarsal head wound continues to improve. Just about completely closed. Still a lot of surface debridement callus. We've been using silver collagen 06/06/18; the first metatarsal wound is completely healed over. There is still a lot of callus. I gently removed some of this just to make sure that there was no open area and there is not. The patient mentions to me that his left leg has felt like "lead" for about a week READMISSION 08/16/2020 This is a 50 year old man with type 2 diabetes. He returns to clinic today with a 1 month history of a blister and callus over the first metatarsal head. This  is in exactly the same area as when he was here in 2019. 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 He has standard running shoes on the left foot to match the area in his prosthesis. He has only been covering this area with a Band-Aid has not really been specifically dressing this. ABI in our clinic on the left was 1.16. This is essentially stable from the value in 2019 10/1 the patient has a linear wound over the left first metatarsal head. Again not a lot of callus thick skin around this that I removed with a #3 curette. This is not go deep to bone or muscle it does not look to be infected. 10/8 first metatarsal head. The  wound looks like it is closing however this is going to be a difficult area to offload in the future. He has a size 15 shoe and he says his shoes are years old. The inserts in these current shoes are totally useless and I have advised him to replace these. He may need a new pair of shoes and I have he got original prosthesis at [facility] [facility] [place] I have asked him to go back there. 11/9; the patient has not been here in more than a month. Not sure he was healed when he was here the last time. I told him he would have to get new shoes and certainly offload the area over the left first metatarsal head. He has done nothing of the kind. He arrives back in clinic with the same old new balance sneakers. A very large separating callus over the first metatarsal head on the left 11/16; he has purchased new shoes and has at least 2 insoles like I asked. The wound is measuring much smaller. He is using silver alginate he changes the dressing himself 11/30; wound is measurably smaller in length of about a half a centimeter. This is generally very little depth. We have been using silver alginate. He changes the dressing himself every second day. 12/14; wound is about the same size. Significant undermining medially relative to the size of the wound. We have been using silver alginate. There is not a way to offload this area as he walks with a prosthesis on the right leg 12/28; wound is about the same size. Again he has undermining medially. I am using polymen on this 12/02/2020; the wound looks smaller. Still a senescent edge. We have been using polymen 1/24; the wound is come down a few millimeters. Still a senescent edge. I have been using polymen 2/8; the wound is come down slightly. Still with slight undermining from 12-6 o'clock I have been using polymen partially to get offloading on this area which is opposed by his prosthesis on the other leg. 2/22 quite open improvement he still has a comma shaped wound  on the left first metatarsal head. Some depth. Using polymen 3/14; he still has the same problem of a comma shaped area over the left first metatarsal head. This seems to have had more depth today at 0.6 cm. We have been using polymen. The patient changes this himself every second day. I explored the idea of a total contact cast on the left leg however this is his driving foot. He was not enamored with the idea of not being able to drive and states that he does not have anybody else to get him around 3/28; again he comes in with a smaller looking  wound however there is depth and undermining requiring debridement. We have been using Hydrofera Blue, changed to silver collagen today. The patient is doing the dressing himself 4/11; patient presents today for follow-up of his left diabetic foot wound. He denies any issues in the past 2 weeks. He is currently using silver collagen every other day with dressing changes. He does not use diabetic shoes or special inserts to his sneakers. He does not use felt pads for offloading. 4/19; patient presents for his 1 week follow-up. He denies any issues. He reports using Hydrofera Blue and has not been using silver collagen for dressing changes. He reports minimal drainage to the wound. He overall feels well. 5/3; patient presents for 2-week follow-up. He has been using silver collagen without issues. He has no complaints today and states he overall feels well. 5/17; patient presents for 2-week follow-up. He states that the sleeve is rubbing up against the back of his right leg and causing a wound. He states this happened a week ago and has not been dressing the wound. The plantar wound is stable and he has been using collagen every other day. 5/24; Patient was had a recent hospitalization for cellulitis of his right popliteal fossa. He was admitted and started on IV antibiotics. He does not recall the plantar wound dressed while inpatient. He feels a lot better  today. He was discharged on 5/21 on oral antibiotics and has not picked this up until today. He reports using collagen to the plantar wound since discharge. He is keeping the right posterior knee wound covered. He is getting a new sleeve for his prostatic on 5/26. He currently denies signs of infection. 6/1; patient presents for 1 week follow-up. He is still on doxycycline. He has not been dressing the back of the right knee wound. He states he is receiving his new prosthetic sleeve tomorrow. He states he is using silver alginate to the plantar foot wound. He currently denies signs of infection. He states he has not been offloading the left foot wound 6/7; patient presents for 1 week follow-up. He completes his last dose of doxycycline today. He finally received his prosthetic sleeve for the right side and he states it feels much better. He has been using antibiotic ointment to the back of that right knee wound. He has been using silver alginate to the plantar foot wound. He currently denies signs of infection. 6/21; patient presents for 2-week follow-up. He reports no issues with his prosthetic sleeve. He has been using antibiotic ointment to the back of the right knee. He has been using silver alginate to the plantar foot wound. He has no issues or complaints today. He denies signs of infection. 7/5; patient presents for 2-week follow-up. He reports no issues or complaints today. He continues to use antibiotic ointment to the right posterior knee wound and silver alginate to the left plantar foot wound. He denies signs of infection. 7/19; patient presents for 2-week follow-up. He continues to use antibiotic ointment to the right posterior knee and silver alginate to the left plantar foot wound. He has no issues or complaints today. He denies signs of infection. 8/4; patient presents for 2-week follow-up. He has no issues or complaints today. He denies signs of infection. Patient  History Information obtained from Patient. Family History Diabetes - Maternal Grandparents,Mother, Heart Disease - Father, Hypertension - Father, No family history of Cancer, Hereditary Spherocytosis, Kidney Disease, Lung Disease, Seizures, Stroke, Thyroid Problems, Tuberculosis. Social History Current every day smoker,  Marital Status - Single, Alcohol Use - Never, Drug Use - No History, Caffeine Use - Never. Medical History Eyes Denies history of Cataracts, Glaucoma, Optic Neuritis Ear/Nose/Mouth/Throat Denies history of Chronic sinus problems/congestion, Middle ear problems Hematologic/Lymphatic Patient has history of Anemia Denies history of Hemophilia, Human Immunodeficiency Virus, Lymphedema, Sickle Cell Disease Respiratory Denies history of Aspiration, Asthma, Chronic Obstructive Pulmonary Disease (COPD), Pneumothorax, Sleep Apnea, Tuberculosis Cardiovascular Patient has history of Hypertension, Peripheral Venous Disease Denies history of Angina, Arrhythmia, Congestive Heart Failure, Coronary Artery Disease, Deep Vein Thrombosis, Hypotension, Myocardial Infarction, Peripheral Arterial Disease, Phlebitis, Vasculitis Gastrointestinal Denies history of Cirrhosis , Colitis, Crohnoos, Hepatitis A, Hepatitis B, Hepatitis C Endocrine Patient has history of Type II Diabetes - oral meds Denies history of Type I Diabetes Genitourinary Denies history of End Stage Renal Disease Immunological Denies history of Lupus Erythematosus, Raynaudoos, Scleroderma Integumentary (Skin) Denies history of History of Burn Musculoskeletal Denies history of Gout, Rheumatoid Arthritis, Osteoarthritis, Osteomyelitis Neurologic Denies history of Dementia, Neuropathy, Quadriplegia, Paraplegia, Seizure Disorder Oncologic Denies history of Received Chemotherapy, Received Radiation Psychiatric Denies history of Anorexia/bulimia, Confinement Anxiety Hospitalization/Surgery History - fever 105, sepsis  cellulitis right popliteal fossa 04/09/2021. Medical A Surgical History Notes [name] Constitutional Symptoms (General Health) falls , leukocytosis Respiratory During waking hours and catches himself not breathing, "gasp for air". Saw Dr Wynonia Lawman, he couldn't find a reason Objective Constitutional respirations regular, non-labored and within target range for patient.. Vitals Time Taken: 12:37 PM, Height: 77 in, Source: Stated, Weight: 232 lbs, Source: Stated, BMI: 27.5, Temperature: 98.1 F, Pulse: 87 bpm, Respiratory Rate: 18 breaths/min, Blood Pressure: 121/78 mmHg, Capillary Blood Glucose: 86 mg/dl. General Notes: glucose per pt report last night Cardiovascular 2+ dorsalis pedis/posterior tibialis pulses. Psychiatric pleasant and cooperative. General Notes: Left first met head: Wound with granulation tissue and nonviable tissue present with circumferential callus. No signs of infection. Posterior right knee: Wound limited to skin breakdown with no signs of infection. Appears well-healing Integumentary (Hair, Skin) Wound #2R status is Open. Original cause of wound was Blister. The date acquired was: 06/23/2020. The wound has been in treatment 44 weeks. The wound is located on the Left Metatarsal head first. The wound measures 0.5cm length x 0.4cm width x 0.3cm depth; 0.157cm^2 area and 0.047cm^3 volume. There is Fat Layer (Subcutaneous Tissue) exposed. There is no tunneling noted, however, there is undermining starting at 6:00 and ending at 12:00 with a maximum distance of 0.4cm. There is a small amount of serosanguineous drainage noted. The wound margin is thickened. There is large (67-100%) red granulation within the wound bed. There is no necrotic tissue within the wound bed. Wound #3 status is Open. Original cause of wound was Pressure Injury. The date acquired was: 03/31/2021. The wound has been in treatment 11 weeks. The wound is located on the Right,Posterior Knee. The wound measures 0.2cm  length x 0.2cm width x 0.1cm depth; 0cm^2 area and 0cm^3 volume. There is Fat Layer (Subcutaneous Tissue) exposed. There is no tunneling or undermining noted. There is a small amount of serosanguineous drainage noted. The wound margin is distinct with the outline attached to the wound base. There is large (67-100%) red granulation within the wound bed. There is no necrotic tissue within the wound bed. 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 I recommended continuing antibiotic ointment and keeping this area protected. The right plantar foot wound is stable. I debrided nonviable tissue. No signs of infection on exam. He has been approved for Apligraf and we will start this in 2 weeks. Procedures Wound #2R Pre-procedure diagnosis of Wound #2R is a Diabetic Wound/Ulcer of the Lower Extremity located on the Left Metatarsal head first .Severity of Tissue Pre Debridement is: Fat layer exposed. There was a Excisional Skin/Subcutaneous Tissue Debridement with a total area of 0.64 sq cm performed by Kalman Shan, DO. With the following instrument(s): Curette to remove Viable and Non-Viable tissue/material. Material removed includes Callus, Subcutaneous Tissue, Slough, Skin: Dermis, Skin: Epidermis, and Fibrin/Exudate after achieving pain control using Lidocaine 4% Topical Solution. A time out was conducted at 12:50, prior to the start of the procedure. A Minimum amount of bleeding was controlled with Pressure. The procedure was tolerated well with a pain level of 0 throughout and a pain level of 0 following the procedure. Post Debridement Measurements:  0.5cm length x 0.4cm width x 0.3cm depth; 0.047cm^3 volume. Character of Wound/Ulcer Post Debridement requires further debridement. Severity of Tissue Post Debridement is: Fat layer exposed. Post procedure Diagnosis Wound #2R: Same as Pre-Procedure Plan Follow-up Appointments: Return Appointment in 2 weeks. - Dr. Heber Walsenburg Tuesday 07/08/2021 Cellular or Tissue Based Products: Wound #2R Left Metatarsal head first: Cellular or Tissue Based Product Type: - wound center to order apligaft for 07/08/2021 to be applied. Bathing/ Shower/ Hygiene: May shower and wash wound with soap and water. - WITH DRESSING CHANGES. Edema Control - Lymphedema / SCD / Other: Elevate legs to the level of the heart or above for 30 minutes daily and/or when sitting, a frequency of: Avoid standing for long periods of time. WOUND #2R: - Metatarsal head first Wound Laterality: Left Cleanser: Soap and Water 1 x Per Day/15 Days Discharge Instructions: May shower and wash wound with dial antibacterial soap and water prior to dressing change. Cleanser: Wound Cleanser (Generic) 1 x Per Day/15 Days Discharge Instructions: Cleanse the wound with wound cleanser prior to applying a clean dressing using gauze sponges, not tissue or cotton balls. Peri-Wound Care: Sween Lotion (Moisturizing lotion) 1 x Per Day/15 Days Discharge Instructions: Apply moisturizing lotion as directed Prim Dressing: KerraCel Ag Gelling Fiber Dressing, 4x5 in (silver alginate) 1 x Per Day/15 Days ary Discharge Instructions: Apply silver alginate to wound bed as instructed Secondary Dressing: Woven Gauze Sponges 2x2 in (Generic) 1 x Per Day/15 Days Discharge Instructions: Apply over primary dressing as directed. Secondary Dressing: Optifoam Non-Adhesive Dressing, 4x4 in (Generic) 1 x Per Day/15 Days Discharge Instructions: Apply over primary dressing cut to make foam donut to help offload Secured With: Conforming Stretch Gauze Bandage, Sterile 2x75 (in/in)  (Generic) 1 x Per Day/15 Days Discharge Instructions: Secure with stretch gauze as directed. Secured With: 72M Medipore H Soft Cloth Surgical T ape, 2x2 (in/yd) (Generic) 1 x Per Day/15 Days Discharge Instructions: Secure dressing with tape as directed. WOUND #3: - Knee Wound Laterality: Right, Posterior Cleanser: Soap and Water 1 x Per HYI/50 Days Discharge Instructions: May shower and wash wound with dial antibacterial soap and water prior to dressing change. Cleanser: Wound Cleanser (Generic) 1 x Per Day/15 Days Discharge Instructions: Cleanse the wound with wound cleanser prior to applying a clean dressing using gauze sponges, not tissue or cotton balls. Topical: Triple Antibiotic Ointment, 1 (oz) Tube 1 x  Per Day/15 Days Secondary Dressing: Zetuvit Plus Silicone Border Dressing 4x4 (in/in) 1 x Per NID/78 Days Discharge Instructions: Apply silicone border over primary dressing as directed. 1. In office sharp debridement 2. Continue silver alginate to the foot wound and antibiotic ointment to the posterior knee wound 3. Follow-up in 2 weeks for Apligraf placement Electronic Signature(s) Signed: 06/26/2021 1:07:12 PM By: Kalman Shan DO Entered By: Kalman Shan on 06/26/2021 13:06:37 -------------------------------------------------------------------------------- HxROS Details Patient Name: Date of Service: [name] [name], [name] 06/26/2021 12:30 PM Medical Record Number: [id number] Patient Account Number: [id number] Date of Birth/Sex: Treating RN: 06-18-1971 (50 y.o. [name] Primary Care Provider: [name] Other Clinician: Referring Provider: Treating Provider/Extender: Delano Metz in Treatment: 50 Information Obtained From Patient Constitutional Symptoms (General Health) Medical History: Past Medical History Notes: falls , leukocytosis Eyes Medical History: Negative for: Cataracts; Glaucoma; Optic  Neuritis Ear/Nose/Mouth/Throat Medical History: Negative for: Chronic sinus problems/congestion; Middle ear problems Hematologic/Lymphatic Medical History: Positive for: Anemia Negative for: Hemophilia; Human Immunodeficiency Virus; Lymphedema; Sickle Cell Disease Respiratory Medical History: Negative for: Aspiration; Asthma; Chronic Obstructive Pulmonary Disease (COPD); Pneumothorax; Sleep Apnea; Tuberculosis Past Medical History Notes: During waking hours and catches himself not breathing, "gasp for air". Saw Dr Wynonia Lawman, he couldn't find a reason Cardiovascular Medical History: Positive for: Hypertension; Peripheral Venous Disease Negative for: Angina; Arrhythmia; Congestive Heart Failure; Coronary Artery Disease; Deep Vein Thrombosis; Hypotension; Myocardial Infarction; Peripheral Arterial Disease; Phlebitis; Vasculitis Gastrointestinal Medical History: Negative for: Cirrhosis ; Colitis; Crohns; Hepatitis A; Hepatitis B; Hepatitis C Endocrine Medical History: Positive for: Type II Diabetes - oral meds Negative for: Type I Diabetes Time with diabetes: 2014 Treated with: Oral agents Blood sugar tested every day: Yes Tested : Blood sugar testing results: Breakfast: 91 Genitourinary Medical History: Negative for: End Stage Renal Disease Immunological Medical History: Negative for: Lupus Erythematosus; Raynauds; Scleroderma Integumentary (Skin) Medical History: Negative for: History of Burn Musculoskeletal Medical History: Negative for: Gout; Rheumatoid Arthritis; Osteoarthritis; Osteomyelitis Neurologic Medical History: Negative for: Dementia; Neuropathy; Quadriplegia; Paraplegia; Seizure Disorder Oncologic Medical History: Negative for: Received Chemotherapy; Received Radiation Psychiatric Medical History: Negative for: Anorexia/bulimia; Confinement Anxiety Immunizations Pneumococcal Vaccine: Received Pneumococcal Vaccination: No Implantable Devices No devices  added Hospitalization / Surgery History Type of Hospitalization/Surgery fever 105, sepsis cellulitis right popliteal fossa 04/09/2021 Family and Social History Cancer: No; Diabetes: Yes - Maternal Grandparents,Mother; Heart Disease: Yes - Father; Hereditary Spherocytosis: No; Hypertension: Yes - Father; Kidney Disease: No; Lung Disease: No; Seizures: No; Stroke: No; Thyroid Problems: No; Tuberculosis: No; Current every day smoker; Marital Status - Single; Alcohol Use: Never; Drug Use: No History; Caffeine Use: Never; Financial Concerns: No; Food, Clothing or Shelter Needs: No; Support System Lacking: No; Transportation Concerns: No Electronic Signature(s) Signed: 06/26/2021 1:07:12 PM By: Kalman Shan DO Signed: 06/26/2021 6:14:36 PM By: Deon Pilling Entered By: Kalman Shan on 06/26/2021 13:04:28 -------------------------------------------------------------------------------- SuperBill Details Patient Name: Date of Service: [name] [name] [name], [place] 06/26/2021 Medical Record Number: [id number] Patient Account Number: [id number] Date of Birth/Sex: Treating RN: 20-May-1971 (50 y.o. [name] Primary Care Provider: [name] Other Clinician: Referring Provider: Treating Provider/Extender: Delano Metz in Treatment: 24 Diagnosis Coding ICD-10 Codes Code Description E11.621 Type 2 diabetes mellitus with foot ulcer S81.801D Unspecified open wound, right lower leg, subsequent encounter L97.522 Non-pressure chronic ulcer of other part of left foot with fat layer exposed E11.42 Type 2 diabetes mellitus with diabetic polyneuropathy Z89.511 Acquired absence of right leg below knee Facility Procedures CPT4  Code: 14709295 Description: 74734 - DEB SUBQ TISSUE 20 SQ CM/< ICD-10 Diagnosis Description L97.522 Non-pressure chronic ulcer of other part of left foot with fat layer exposed Modifier: Quantity: 1 Physician Procedures : CPT4 Code Description  Modifier [id number] - WC PHYS SUBQ TISS 20 SQ CM ICD-10 Diagnosis Description L97.522 Non-pressure chronic ulcer of other part of left foot with fat layer exposed Quantity: 1 Electronic Signature(s) Signed: 06/26/2021 1:07:12 PM By: Kalman Shan DO Entered By: Kalman Shan on 06/26/2021 13:06:44

## 2021-07-01 NOTE — Progress Notes (Signed)
[name], [name] ([id number]) Visit Report for 06/26/2021 Arrival Information Details Patient Name: Date of Service: [name] [name] 06/26/2021 12:30 PM Medical Record Number: [id number] Patient Account Number: [id number] Date of Birth/Sex: Treating RN: [birth year] (50 y.o. [name] [name], [name] [name] Primary Care Jash Wahlen: [name] Other Clinician: Referring Vander Kueker: Treating Keron Neenan/Extender: Delano Metz in Treatment: 40 Visit Information History Since Last Visit Added or deleted any medications: No Patient Arrived: Ambulatory Any new allergies or adverse reactions: No Arrival Time: 12:32 Had a fall or experienced change in No Accompanied By: self activities of daily living that may affect Transfer Assistance: None risk of falls: Patient Identification Verified: Yes Signs or symptoms of abuse/neglect since last visito No Secondary Verification Process Completed: Yes Hospitalized since last visit: No Patient Requires Transmission-Based Precautions: No Implantable device outside of the clinic excluding No Patient Has Alerts: No cellular tissue based products placed in the center since last visit: Has Dressing in Place as Prescribed: Yes Pain Present Now: No Electronic Signature(s) Signed: 06/26/2021 5:39:10 PM By: Baruch Gouty RN, BSN Entered By: Baruch Gouty on 06/26/2021 12:34:09 -------------------------------------------------------------------------------- Encounter Discharge Information Details Patient Name: Date of Service: [name] [name] ND, [name] 06/26/2021 12:30 PM Medical Record Number: [id number] Patient Account Number: [id number] Date of Birth/Sex: Treating RN: 09/03/1971 (50 y.o. [name] [name], [name] [name] Primary Care Fremont Skalicky: [name] Other Clinician: Referring Seward Coran: Treating Jaydalynn Olivero/Extender: Delano Metz in Treatment: 77 Encounter Discharge Information Items Post Procedure Vitals Discharge  Condition: Stable Temperature (F): 97.7 Ambulatory Status: Ambulatory Pulse (bpm): 74 Discharge Destination: Home Respiratory Rate (breaths/min): 17 Transportation: Private Auto Blood Pressure (mmHg): 147/74 Accompanied By: self Schedule Follow-up Appointment: Yes Clinical Summary of Care: Patient Declined Electronic Signature(s) Signed: 07/01/2021 5:35:30 PM By: Rhae Hammock RN Entered By: Rhae Hammock on 06/26/2021 17:12:44 -------------------------------------------------------------------------------- Lower Extremity Assessment Details Patient Name: Date of Service: [name], [name] 06/26/2021 12:30 PM Medical Record Number: [id number] Patient Account Number: [id number] Date of Birth/Sex: Treating RN: 17-Nov-1971 (50 y.o. [name] [name] Primary Care Deasiah Hagberg: [name] Other Clinician: Referring Jenavie Stanczak: Treating Katoya Amato/Extender: Delano Metz in Treatment: 44 Edema Assessment Assessed: [name]: No] [name]: No] Edema: [Left: Ye] [Right: s] Calf Left: Right: Point of Measurement: 50 cm From Medial Instep 42.5 cm Ankle Left: Right: Point of Measurement: 11 cm From Medial Instep 27.8 cm Vascular Assessment Pulses: Dorsalis Pedis Palpable: [Left:Yes] Electronic Signature(s) Signed: 06/26/2021 5:39:10 PM By: Baruch Gouty RN, BSN Entered By: Baruch Gouty on 06/26/2021 12:40:43 -------------------------------------------------------------------------------- Multi Wound Chart Details Patient Name: Date of Service: [name] [name] ND, [name] 06/26/2021 12:30 PM Medical Record Number: [id number] Patient Account Number: [id number] Date of Birth/Sex: Treating RN: Dec 14, 1970 (50 y.o. [name] [name], [name] Primary Care Pami Wool: [name] Other Clinician: Referring Aimi Essner: Treating [name] [name]/Extender: Delano Metz in Treatment: 37 Vital Signs Height(in): 77 Capillary Blood Glucose(mg/dl):  86 Weight(lbs): 232 Pulse(bpm): 25 Body Mass Index(BMI): 28 Blood Pressure(mmHg): 121/78 Temperature(F): 98.1 Respiratory Rate(breaths/min): 18 Photos: [2R:Left Metatarsal head first] [3:Right, Posterior Knee] [N/A:N/A N/A] Wound Location: [2R:Blister] [3:Pressure Injury] [N/A:N/A] Wounding Event: [2R:Diabetic Wound/Ulcer of the Lower] [3:Pressure Ulcer] [N/A:N/A] Primary Etiology: [2R:Extremity Anemia, Hypertension, Peripheral] [3:Anemia, Hypertension, Peripheral] [N/A:N/A] Comorbid History: [2R:Venous Disease, Type II Diabetes 06/23/2020] [3:Venous Disease, Type II Diabetes 03/31/2021] [N/A:N/A] Date Acquired: [2R:44] [3:11] [N/A:N/A] Weeks of Treatment: [2R:Open] [3:Open] [N/A:N/A] Wound Status: [2R:Yes] [3:No] [N/A:N/A] Wound Recurrence: [2R:No] [3:Yes] [N/A:N/A] Clustered Wound: [2R:N/A] [3:1] [N/A:N/A] Clustered Quantity: [2R:0.5x0.4x0.3] [3:0.2x0.2x0.1] [N/A:N/A] Measurements L x W x D (cm) [  2R:0.157] [3:0] [N/A:N/A] A (cm) : rea [2R:0.047] [3:0] [N/A:N/A] Volume (cm) : [2R:-406.50%] [3:100.00%] [N/A:N/A] % Reduction in A [2R:rea: -1466.70%] [3:100.00%] [N/A:N/A] % Reduction in Volume: [2R:6] Starting Position 1 (o'clock): [2R:12] Ending Position 1 (o'clock): [2R:0.4] Maximum Distance 1 (cm): [2R:Yes] [3:No] [N/A:N/A] Undermining: [2R:Grade 2] [3:Category/Stage II] [N/A:N/A] Classification: [2R:Small] [3:Small] [N/A:N/A] Exudate A mount: [2R:Serosanguineous] [3:Serosanguineous] [N/A:N/A] Exudate Type: [2R:red, brown] [3:red, brown] [N/A:N/A] Exudate Color: [2R:Thickened] [3:Distinct, outline attached] [N/A:N/A] Wound Margin: [2R:Large (67-100%)] [3:Large (67-100%)] [N/A:N/A] Granulation A mount: [2R:Red] [3:Red] [N/A:N/A] Granulation Quality: [2R:None Present (0%)] [3:None Present (0%)] [N/A:N/A] Necrotic A mount: [2R:Fat Layer (Subcutaneous Tissue): Yes Fat Layer (Subcutaneous Tissue): Yes N/A] Exposed Structures: [2R:Fascia: No Tendon: No Muscle: No Joint: No Bone: No  None] [3:Fascia: No Tendon: No Muscle: No Joint: No Bone: No Large (67-100%)] [N/A:N/A] Epithelialization: [2R:Debridement - Excisional] [3:N/A] [N/A:N/A] Debridement: Pre-procedure Verification/Time Out 12:50 [3:N/A] [N/A:N/A] Taken: [2R:Lidocaine 4% Topical Solution] [3:N/A] [N/A:N/A] Pain Control: [2R:Callus, Subcutaneous, Slough] [3:N/A] [N/A:N/A] Tissue Debrided: [2R:Skin/Subcutaneous Tissue] [3:N/A] [N/A:N/A] Level: [2R:0.64] [3:N/A] [N/A:N/A] Debridement A (sq cm): [2R:rea Curette] [3:N/A] [N/A:N/A] Instrument: [2R:Minimum] [3:N/A] [N/A:N/A] Bleeding: [2R:Pressure] [3:N/A] [N/A:N/A] Hemostasis A chieved: [2R:0] [3:N/A] [N/A:N/A] Procedural Pain: [2R:0] [3:N/A] [N/A:N/A] Post Procedural Pain: [2R:Procedure was tolerated well] [3:N/A] [N/A:N/A] Debridement Treatment Response: [2R:0.5x0.4x0.3] [3:N/A] [N/A:N/A] Post Debridement Measurements L x W x D (cm) [2R:0.047] [3:N/A] [N/A:N/A] Post Debridement Volume: (cm) [2R:Debridement] [3:N/A] [N/A:N/A] Treatment Notes Electronic Signature(s) Signed: 06/26/2021 1:07:12 PM By: Kalman Shan DO Signed: 06/26/2021 6:14:36 PM By: Deon Pilling Entered By: Kalman Shan on 06/26/2021 13:03:11 -------------------------------------------------------------------------------- Myrtle Details Patient Name: Date of Service: [name] [name] ND, [name] 06/26/2021 12:30 PM Medical Record Number: [id number] Patient Account Number: [id number] Date of Birth/Sex: Treating RN: 02/21/1971 (49 y.o. [name] Primary Care Millee Denise: [name] Other Clinician: Referring Fina Heizer: Treating Zymir Napoli/Extender: Delano Metz in Treatment: [place] reviewed with physician Active Inactive Wound/Skin Impairment Nursing Diagnoses: Impaired tissue integrity Goals: Patient/caregiver will verbalize understanding of skin care regimen Date Initiated: 11/19/2020 Target Resolution Date:  06/13/2021 Goal Status: Active Ulcer/skin breakdown will have a volume reduction of 30% by week 4 Date Initiated: 08/16/2020 Date Inactivated: 03/03/2021 Target Resolution Date: 03/10/2021 Goal Status: Met Interventions: Provide education on ulcer and skin care Notes: Electronic Signature(s) Signed: 06/26/2021 6:14:36 PM By: Deon Pilling Entered By: Deon Pilling on 06/26/2021 12:39:07 -------------------------------------------------------------------------------- Pain Assessment Details Patient Name: Date of Service: [name] [name] ND, [name] 06/26/2021 12:30 PM Medical Record Number: [id number] Patient Account Number: [id number] Date of Birth/Sex: Treating RN: [birth year] (50 y.o. [name] [name] Primary Care Joie Reamer: [name] Other Clinician: Referring Madelina Sanda: Treating Dashawn Golda/Extender: Delano Metz in Treatment: 64 Active Problems Location of Pain Severity and Description of Pain Patient Has Paino No Site Locations Rate the pain. Current Pain Level: 0 Pain Management and Medication Current Pain Management: Electronic Signature(s) Signed: 06/26/2021 5:39:10 PM By: Baruch Gouty RN, BSN Entered By: Baruch Gouty on 06/26/2021 12:38:27 -------------------------------------------------------------------------------- Patient/Caregiver Education Details Patient Name: Date of Service: [name] [name] 8/4/2022andnbsp12:30 PM Medical Record Number: [id number] Patient Account Number: [id number] Date of Birth/Gender: Treating RN: 21-May-1971 (50 y.o. [name] Primary Care Physician: [name] Other Clinician: Referring Physician: Treating Physician/Extender: Delano Metz in Treatment: 56 Education Assessment Education Provided To: Patient Education Topics Provided Wound/Skin Impairment: Handouts: Skin Care Do's and Dont's Methods: Explain/Verbal Responses: Reinforcements needed Electronic  Signature(s) Signed: 06/26/2021 6:14:36 PM By: Deon Pilling Entered By: Deon Pilling on 06/26/2021 12:39:19 --------------------------------------------------------------------------------  Wound Assessment Details Patient Name: Date of Service: [name] [name] 06/26/2021 12:30 PM Medical Record Number: [id number] Patient Account Number: [id number] Date of Birth/Sex: Treating RN: 1971-01-09 (50 y.o. [name] [name] Primary Care Lynnox Girten: [name] Other Clinician: Referring Yuvin Bussiere: Treating Ericka Marcellus/Extender: Delano Metz in Treatment: 81 Wound Status Wound Number: 2R Primary Diabetic Wound/Ulcer of the Lower Extremity Etiology: Wound Location: Left Metatarsal head first Wound Status: Open Wounding Event: Blister Comorbid Anemia, Hypertension, Peripheral Venous Disease, Type II Date Acquired: 06/23/2020 History: Diabetes Weeks Of Treatment: 44 Clustered Wound: No Photos Wound Measurements Length: (cm) 0.5 Width: (cm) 0.4 Depth: (cm) 0.3 Area: (cm) 0.157 Volume: (cm) 0.047 % Reduction in Area: -406.5% % Reduction in Volume: -1466.7% Epithelialization: None Tunneling: No Undermining: Yes Starting Position (o'clock): 6 Ending Position (o'clock): 12 Maximum Distance: (cm) 0.4 Wound Description Classification: Grade 2 Wound Margin: Thickened Exudate Amount: Small Exudate Type: Serosanguineous Exudate Color: red, brown Foul Odor After Cleansing: No Slough/Fibrino No Wound Bed Granulation Amount: Large (67-100%) Exposed Structure Granulation Quality: Red Fascia Exposed: No Necrotic Amount: None Present (0%) Fat Layer (Subcutaneous Tissue) Exposed: Yes Tendon Exposed: No Muscle Exposed: No Joint Exposed: No Bone Exposed: No Treatment Notes Wound #2R (Metatarsal head first) Wound Laterality: Left Cleanser Soap and Water Discharge Instruction: May shower and wash wound with dial antibacterial soap and water prior to  dressing change. Wound Cleanser Discharge Instruction: Cleanse the wound with wound cleanser prior to applying a clean dressing using gauze sponges, not tissue or cotton balls. Peri-Wound Care Sween Lotion (Moisturizing lotion) Discharge Instruction: Apply moisturizing lotion as directed Topical Primary Dressing KerraCel Ag Gelling Fiber Dressing, 4x5 in (silver alginate) Discharge Instruction: Apply silver alginate to wound bed as instructed Secondary Dressing Woven Gauze Sponges 2x2 in Discharge Instruction: Apply over primary dressing as directed. Optifoam Non-Adhesive Dressing, 4x4 in Discharge Instruction: Apply over primary dressing cut to make foam donut to help offload Secured With Conforming Stretch Gauze Bandage, Sterile 2x75 (in/in) Discharge Instruction: Secure with stretch gauze as directed. 46M Medipore H Soft Cloth Surgical T ape, 2x2 (in/yd) Discharge Instruction: Secure dressing with tape as directed. Compression Wrap Compression Stockings Add-Ons Electronic Signature(s) Signed: 06/26/2021 5:39:10 PM By: Baruch Gouty RN, BSN Entered By: Baruch Gouty on 06/26/2021 12:47:15 -------------------------------------------------------------------------------- Wound Assessment Details Patient Name: Date of Service: [name] [name] ND, [name] 06/26/2021 12:30 PM Medical Record Number: [id number] Patient Account Number: [id number] Date of Birth/Sex: Treating RN: Dec 01, 1970 (50 y.o. [name] [name], [name] Primary Care Haedyn Breau: [name] Other Clinician: Referring Teva Bronkema: Treating Isael Stille/Extender: Delano Metz in Treatment: 42 Wound Status Wound Number: 3 Primary Pressure Ulcer Etiology: Wound Location: Right, Posterior Knee Wound Status: Open Wounding Event: Pressure Injury Comorbid Anemia, Hypertension, Peripheral Venous Disease, Type II Date Acquired: 03/31/2021 History: Diabetes Weeks Of Treatment: 11 Clustered Wound:  Yes Photos Wound Measurements Length: (cm) 0.2 Width: (cm) 0.2 Depth: (cm) 0.1 Clustered Quantity: 1 Area: (cm) 0 Volume: (cm) 0 % Reduction in Area: 100% % Reduction in Volume: 100% Epithelialization: Large (67-100%) Tunneling: No Undermining: No Wound Description Classification: Category/Stage II Wound Margin: Distinct, outline attached Exudate Amount: Small Exudate Type: Serosanguineous Exudate Color: red, brown Foul Odor After Cleansing: No Slough/Fibrino No Wound Bed Granulation Amount: Large (67-100%) Exposed Structure Granulation Quality: Red Fascia Exposed: No Necrotic Amount: None Present (0%) Fat Layer (Subcutaneous Tissue) Exposed: Yes Tendon Exposed: No Muscle Exposed: No Joint Exposed: No Bone Exposed: No Treatment Notes Wound #3 (Knee) Wound Laterality: Right, Posterior Cleanser Soap  and Water Discharge Instruction: May shower and wash wound with dial antibacterial soap and water prior to dressing change. Wound Cleanser Discharge Instruction: Cleanse the wound with wound cleanser prior to applying a clean dressing using gauze sponges, not tissue or cotton balls. Peri-Wound Care Topical Triple Antibiotic Ointment, 1 (oz) Tube Primary Dressing Secondary Dressing Zetuvit Plus Silicone Border Dressing 4x4 (in/in) Discharge Instruction: Apply silicone border over primary dressing as directed. Secured With Compression Wrap Compression Stockings Environmental education officer) Signed: 06/26/2021 6:14:36 PM By: Deon Pilling Entered By: Deon Pilling on 06/26/2021 13:00:48 -------------------------------------------------------------------------------- Vitals Details Patient Name: Date of Service: [name] [name] ND, [name] 06/26/2021 12:30 PM Medical Record Number: [id number] Patient Account Number: [id number] Date of Birth/Sex: Treating RN: May 09, 1971 (50 y.o. [name] [name] Primary Care Ifeanyichukwu Wickham: [name] Other Clinician: Referring  Ashleigh Arya: Treating Azeneth Carbonell/Extender: Delano Metz in Treatment: 32 Vital Signs Time Taken: 12:37 Temperature (F): 98.1 Height (in): 77 Pulse (bpm): 87 Source: Stated Respiratory Rate (breaths/min): 18 Weight (lbs): 232 Blood Pressure (mmHg): 121/78 Source: Stated Capillary Blood Glucose (mg/dl): 86 Body Mass Index (BMI): 27.5 Reference Range: 80 - 120 mg / dl Notes glucose per pt report last night Electronic Signature(s) Signed: 06/26/2021 5:39:10 PM By: Baruch Gouty RN, BSN Entered By: Baruch Gouty on 06/26/2021 12:37:54

## 2021-07-08 ENCOUNTER — Other Ambulatory Visit (HOSPITAL_COMMUNITY): Payer: Self-pay | Admitting: *Deleted

## 2021-07-09 ENCOUNTER — Encounter (HOSPITAL_COMMUNITY)
Admission: RE | Admit: 2021-07-09 | Discharge: 2021-07-09 | Disposition: A | Discharge status: Home / Self Care | Payer: Medicare Other | Source: Ambulatory Visit | Attending: Nephrology | Admitting: Nephrology

## 2021-07-09 VITALS — BP 122/83 | HR 98 | Temp 98.1°F | Resp 18

## 2021-07-09 DIAGNOSIS — D649 Anemia, unspecified: Secondary | ICD-10-CM | POA: Diagnosis present

## 2021-07-09 DIAGNOSIS — N179 Acute kidney failure, unspecified: Secondary | ICD-10-CM | POA: Insufficient documentation

## 2021-07-09 DIAGNOSIS — D62 Acute posthemorrhagic anemia: Secondary | ICD-10-CM | POA: Insufficient documentation

## 2021-07-09 LAB — POCT HEMOGLOBIN-HEMACUE: Hemoglobin: 10.3 g/dL — ABNORMAL LOW (ref 13.0–17.0)

## 2021-07-09 MED ORDER — EPOETIN ALFA-EPBX 10000 UNIT/ML IJ SOLN
INTRAMUSCULAR | Status: AC
  Administered 2021-07-09: 10000 [IU] via SUBCUTANEOUS
  Filled 2021-07-09: qty 1

## 2021-07-09 MED ORDER — EPOETIN ALFA-EPBX 10000 UNIT/ML IJ SOLN: 10000.0000 [IU] | INTRAMUSCULAR | Status: DC

## 2021-07-10 ENCOUNTER — Encounter (HOSPITAL_BASED_OUTPATIENT_CLINIC_OR_DEPARTMENT_OTHER): Payer: Medicare Other | Admitting: Internal Medicine

## 2021-07-10 ENCOUNTER — Other Ambulatory Visit: Payer: Self-pay

## 2021-07-10 DIAGNOSIS — L97522 Non-pressure chronic ulcer of other part of left foot with fat layer exposed: Secondary | ICD-10-CM

## 2021-07-10 DIAGNOSIS — E11621 Type 2 diabetes mellitus with foot ulcer: Secondary | ICD-10-CM | POA: Diagnosis not present

## 2021-07-10 NOTE — Progress Notes (Signed)
[name] [name], [name] [name] ([id number]) Visit Report for 07/10/2021 Chief Complaint Document Details Patient Name: Date of Service: [name] [name] 07/10/2021 9:45 A M Medical Record Number: [id number] Patient Account Number: [id number] Date of Birth/Sex: Treating RN: [birth year] (50 y.o. [name] [name], [name] [name] Primary Care Provider: Ferd [name] Other Clinician: Referring Provider: Treating Provider/Extender: [name] [name] in Treatment: 49 Information Obtained from: Patient Chief Complaint patient is here for a review of the wound on his plantar left first metatarsal head and back of his right leg Electronic Signature(s) Signed: 07/10/2021 11:12:05 AM By: Kalman Shan DO Entered By: Kalman [name] on 07/10/2021 10:58:34 -------------------------------------------------------------------------------- Cellular or Tissue Based Product Details Patient Name: Date of Service: [name] [name], [name] 07/10/2021 9:45 A M Medical Record Number: [id number] Patient Account Number: [id number] Date of Birth/Sex: Treating RN: July 07, 1971 (50 y.o. [name] [name], [name] [name] Primary Care Provider: Ferd [name] Other Clinician: Referring Provider: Treating Provider/Extender: [name] [name] in Treatment: 09 Cellular or Tissue Based Product Type Wound #2R Left Metatarsal head first Applied to: Performed By: Physician Kalman Shan, DO Cellular or Tissue Based Product Type: Apligraf Level of Consciousness (Pre-procedure): Awake and Alert Pre-procedure Verification/Time Out Yes - 10:45 Taken: Location: genitalia / hands / feet / multiple digits Wound Size (sq cm): 0.2 Product Size (sq cm): 44 Waste Size (sq cm): 30 Amount of Product Applied (sq cm): 14 Instrument Used: Blade, Forceps, Scissors Lot #: [id number] Expiration Date: 07/16/2021 Fenestrated: Yes Instrument: Blade Reconstituted: Yes Solution Type: NS Solution Amount: 14ml Lot  #: [id number] Expiration Date: 07/24/2022 Secured: Yes Secured With: Steri-Strips Dressing Applied: Yes Primary Dressing: adaptic Procedural Pain: 0 Post Procedural Pain: 0 Response to Treatment: Procedure was tolerated well Level of Consciousness (Post- Awake and Alert procedure): Post Procedure Diagnosis Same as Pre-procedure Electronic Signature(s) Signed: 07/10/2021 11:36:58 AM By: [name] ShanDO Signed: 07/10/2021 5:10:54 PM By: BRhae HammockRN Entered By: BRhae Hammockon 07/10/2021 11:34:05 -------------------------------------------------------------------------------- Debridement Details Patient Name: Date of Service: [name] ShireND, [name] 07/10/2021 9:45 A M Medical Record Number: [id number] Account Number: [id number] of Birth/Sex: Treating RN: 131-Mar-1972(50y.o. MBurnadette [name] [name] [name] Primary Care Provider: BFerd HibbsOther Clinician: Referring Provider: Treating Provider/Extender: HDelano Metzin Treatment:: 35Debridement Performed for Assessment: Wound #2R Left Metatarsal head first Performed By: Physician [name] Shan DO Debridement Type: Debridement Severity of Tissue Pre Debridement: Fat layer exposed Level of Consciousness (Pre-procedure): Awake and Alert Pre-procedure Verification/Time Out Yes - 10:37 Taken: Start Time: 10:37 Pain Control: Lidocaine T Area Debrided (L x W): otal 0.5 (cm) x 0.4 (cm) = 0.2 (cm) Tissue and other material debrided: Viable, Non-Viable, Callus, Subcutaneous, Skin: Dermis , Skin: Epidermis Level: Skin/Subcutaneous Tissue Debridement Description: Excisional Instrument: Curette Bleeding: Minimum Hemostasis Achieved: Pressure End Time: 10:37 Procedural Pain: 0 Post Procedural Pain: 0 Response to Treatment: Procedure was tolerated well Level of Consciousness (Post- Awake and Alert procedure): Post Debridement Measurements of Total Wound Length: (cm)  0.5 Width: (cm) 0.4 Depth: (cm) 0.4 Volume: (cm) 0.063 Character of Wound/Ulcer Post Debridement: Improved Severity of Tissue Post Debridement: Fat layer exposed Post Procedure Diagnosis Same as Pre-procedure Electronic Signature(s) Signed: 07/10/2021 11:12:05 AM By: [name] ShanDO Signed: 07/10/2021 5:10:54 PM By: BRhae HammockRN Entered By: BRhae Hammockon 07/10/2021 10:37:50 -------------------------------------------------------------------------------- HPI Details Patient Name: Date of Service: [name] [name], [name] 07/10/2021 9:45 A M Medical Record Number: [id number] Account Number: [id number] of Birth/Sex: Treating RN: 1Mar 04, 1972(50  y.o. [name] [name] Primary Care Provider: Ferd [name] Other Clinician: Referring Provider: Treating Provider/Extender: [name] [name] in Treatment: 23 History of Present Illness HPI Description: 04/21/18 ADMISSION This is a 50 year old man who is a type II diabetic. In spite of fact his hemoglobin A1c is actually quite good 5.73 months ago he is at a really difficult time over the last 6-7 months. He developed a rapidly progressive infection in the right foot in November 2018 associated with osteomyelitis and necrotizing fasciitis. 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 The stump required and a revision on 11/24/17. The stump revision was advertised as being secondary to falls. I'm not sure the progressive history here however this area is actually closed over. The patient tells me over the same timeframe he has had wounds on the plantar aspect of his left foot. In the ED saw Dr. Sharol Given in April of this year. 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 It is really not clear that this patient is been dressing this with anything. He came in with the clinic without any specific dressing on the wound areas using his own tennis shoe. He tells me that  he only ambulates of course to do a pivot transfer. He does not have his prosthesis for the right leg as of yet and he blames Medicaid for this. He does however use the foot to push himself along in his wheelchair at home. The patient has not had formal arterial studies. ABI in our clinic on the left was 1.13. Patient's past medical history includes type 2 diabetes, fracture of the right fibula. I see that he was treated for abscesses on his right buttock and chest in 2016. I did not look at the microbiology of this. 04/28/18; patient comes back in the clinic today with the wound pretty much the same as when he came in here last week. Small opening lots of undermining relatively. He tells [place] that nothing is really been on this for 3 days in spite of the fact that we gave him enough to dress this easily especially such a small wound. He says he lives with his mother, she is not capable of assisting with this he is changing the dressings himself. 05/05/18; much better-looking wound today. Smaller. There is some undermining medially however that's only perhaps 2 mm. No surrounding erythema. We've been using silver collagen 05/12/18; small wound on the first metatarsal head. No undermining no surrounding erythema. We've been using silver collagen he has home health coming out to change 05/20/18 the wound on the first metatarsal head looks better. Covered in surface debris/callus nonviable tissue. Required debridement but post debridement this looks quite good. We've been using silver collagen. He has home health 05/27/18; first metatarsal head wound continues to improve. Just about completely closed. Still a lot of surface debridement callus. We've been using silver collagen 06/06/18; the first metatarsal wound is completely healed over. There is still a lot of callus. I gently removed some of this just to make sure that there was no open area and there is not. The patient mentions to me that his left leg has  felt like "lead" for about a week READMISSION 08/16/2020 This is a 50 year old man with type 2 diabetes. He returns to clinic today with a 1 month history of a blister and callus over the first metatarsal head. This is in exactly the same area as when he was here in  2019. 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 He has standard running shoes on the left foot to match the area in his prosthesis. He has only been covering this area with a Band-Aid has not really been specifically dressing this. ABI in our clinic on the left was 1.16. This is essentially stable from the value in 2019 10/1 the patient has a linear wound over the left first metatarsal head. Again not a lot of callus thick skin around this that I removed with a #3 curette. This is not go deep to bone or muscle it does not look to be infected. 10/8 first metatarsal head. The wound looks like it is closing however this is going to be a difficult area to offload in the future. He has a size 15 shoe and he says his shoes are years old. The inserts in these current shoes are totally useless and I have advised him to replace these. He may need a new pair of shoes and I have he got original prosthesis at [facility] [place] I have asked him to go back there. 11/9; the patient has not been here in more than a month. Not sure he was healed when he was here the last time. I told him he would have to get new shoes and certainly offload the area over the left first metatarsal head. He has done nothing of the kind. He arrives back in clinic with the same old new balance sneakers. A very large separating callus over the first metatarsal head on the left 11/16; he has purchased new shoes and has at least 2 insoles like I asked. The wound is measuring much smaller. He is using silver alginate he changes the dressing himself 11/30; wound is measurably smaller in length of about a half a centimeter. This is  generally very little depth. We have been using silver alginate. He changes the dressing himself every second day. 12/14; wound is about the same size. Significant undermining medially relative to the size of the wound. We have been using silver alginate. There is not a way to offload this area as he walks with a prosthesis on the right leg 12/28; wound is about the same size. Again he has undermining medially. I am using polymen on this 12/02/2020; the wound looks smaller. Still a senescent edge. We have been using polymen 1/24; the wound is come down a few millimeters. Still a senescent edge. I have been using polymen 2/8; the wound is come down slightly. Still with slight undermining from 12-6 o'clock I have been using polymen partially to get offloading on this area which is opposed by his prosthesis on the other leg. 2/22 quite open improvement he still has a comma shaped wound on the left first metatarsal head. Some depth. Using polymen 3/14; he still has the same problem of a comma shaped area over the left first metatarsal head. This seems to have had more depth today at 0.6 cm. We have been using polymen. The patient changes this himself every second day. I explored the idea of a total contact cast on the left leg however this is his driving foot. He was not enamored with the idea of not being able to drive and states that he does not have anybody else to get him around 3/28; again he comes in with a smaller looking wound however there is depth and undermining requiring debridement. We have been using  Hydrofera Blue, changed to silver collagen today. The patient is doing the dressing himself 4/11; patient presents today for follow-up of his left diabetic foot wound. He denies any issues in the past 2 weeks. He is currently using silver collagen every other day with dressing changes. He does not use diabetic shoes or special inserts to his sneakers. He does not use felt pads for  offloading. 4/19; patient presents for his 1 week follow-up. He denies any issues. He reports using Hydrofera Blue and has not been using silver collagen for dressing changes. He reports minimal drainage to the wound. He overall feels well. 5/3; patient presents for 2-week follow-up. He has been using silver collagen without issues. He has no complaints today and states he overall feels well. 5/17; patient presents for 2-week follow-up. He states that the sleeve is rubbing up against the back of his right leg and causing a wound. He states this happened a week ago and has not been dressing the wound. The plantar wound is stable and he has been using collagen every other day. 5/24; Patient was had a recent hospitalization for cellulitis of his right popliteal fossa. He was admitted and started on IV antibiotics. He does not recall the plantar wound dressed while inpatient. He feels a lot better today. He was discharged on 5/21 on oral antibiotics and has not picked this up until today. He reports using collagen to the plantar wound since discharge. He is keeping the right posterior knee wound covered. He is getting a new sleeve for his prostatic on 5/26. He currently denies signs of infection. 6/1; patient presents for 1 week follow-up. He is still on doxycycline. He has not been dressing the back of the right knee wound. He states he is receiving his new prosthetic sleeve tomorrow. He states he is using silver alginate to the plantar foot wound. He currently denies signs of infection. He states he has not been offloading the left foot wound 6/7; patient presents for 1 week follow-up. He completes his last dose of doxycycline today. He finally received his prosthetic sleeve for the right side and he states it feels much better. He has been using antibiotic ointment to the back of that right knee wound. He has been using silver alginate to the plantar foot wound. He currently denies signs of  infection. 6/21; patient presents for 2-week follow-up. He reports no issues with his prosthetic sleeve. He has been using antibiotic ointment to the back of the right knee. He has been using silver alginate to the plantar foot wound. He has no issues or complaints today. He denies signs of infection. 7/5; patient presents for 2-week follow-up. He reports no issues or complaints today. He continues to use antibiotic ointment to the right posterior knee wound and silver alginate to the left plantar foot wound. He denies signs of infection. 7/19; patient presents for 2-week follow-up. He continues to use antibiotic ointment to the right posterior knee and silver alginate to the left plantar foot wound. He has no issues or complaints today. He denies signs of infection. 8/4; patient presents for 2-week follow-up. He has no issues or complaints today. He denies signs of infection. 8/18; patient presents for 2-week follow-up. He has no issues or concerns today. He has been approved for Apligraf and he would like to have this placed today. He denies signs of infection and reports that his right posterior knee wound is closed. Electronic Signature(s) Signed: 07/10/2021 11:12:05 AM By: Kalman Shan DO Entered  By: Kalman [name] on 07/10/2021 10:59:19 -------------------------------------------------------------------------------- Physical Exam Details Patient Name: Date of Service: [name] Arrow [name] 07/10/2021 9:45 A M Medical Record Number: [id number] Patient Account Number: [id number] Date of Birth/Sex: Treating RN: [birth year] (50 y.o. [name] [name] Primary Care Provider: Ferd [name] Other Clinician: Referring Provider: Treating Provider/Extender: [name] [name] in Treatment: 67 Constitutional respirations regular, non-labored and within target range for patient.Marland Kitchen Psychiatric pleasant and cooperative. Notes Left first met head: Wound with granulation  tissue and nonviable tissue present. Post debridement healthy granulation tissue present. No signs of infection. Posterior right knee: Epithelialization to previous wound site. Electronic Signature(s) Signed: 07/10/2021 11:12:05 AM By: Kalman Shan DO Entered By: Kalman [name] on 07/10/2021 11:00:03 -------------------------------------------------------------------------------- Physician Orders Details Patient Name: Date of Service: [name] [name], [name] 07/10/2021 9:45 A M Medical Record Number: [id number] Patient Account Number: [id number] Date of Birth/Sex: Treating RN: 10-13-1971 (50 y.o. 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 - Dr. Heber Naranjito Return A Cellular or Tissue Based Products Wound #2R Left Metatarsal head first Cellular or Tissue Based Product Type: - Apligraf #1 Bathing/ Shower/ Hygiene May shower and wash wound with soap and water. - WITH DRESSING CHANGES. Edema Control - Lymphedema / SCD / Other Elevate legs to the level of the heart or above for 30 minutes daily and/or when sitting, a frequency of: Avoid standing for long periods of time. Wound Treatment Wound #2R - Metatarsal head first Wound Laterality: Left Cleanser: Soap and Water 1 x Per YQM/57 Days Discharge Instructions: May shower and wash wound with dial antibacterial soap and water prior to dressing change. Cleanser: Wound  Cleanser (Generic) 1 x Per Day/15 Days Discharge Instructions: Cleanse the wound with wound cleanser prior to applying a clean dressing using gauze sponges, not tissue or cotton balls. Peri-Wound Care: Sween Lotion (Moisturizing lotion) 1 x Per Day/15 Days Discharge Instructions: Apply moisturizing lotion as directed Secondary Dressing: Woven Gauze Sponges 2x2 in (Generic) 1 x Per QIO/96 Days Discharge Instructions: Apply over primary dressing as directed. Secondary Dressing: Optifoam Non-Adhesive Dressing, 4x4 in (Generic) 1 x Per Day/15 Days Discharge Instructions: Apply over primary dressing cut to make foam donut to help offload Secured With: Conforming Stretch Gauze Bandage, Sterile 2x75 (in/in) (Generic) 1 x Per Day/15 Days Discharge Instructions: Secure with stretch gauze as directed. Secured With: 82M Medipore H Soft Cloth Surgical Tape, 2x2 (in/yd) (Generic) 1 x Per Day/15 Days Discharge Instructions: Secure dressing with tape as directed. Electronic Signature(s) Signed: 07/10/2021 11:12:05 AM By: Kalman Shan DO Entered By: Kalman [name] on 07/10/2021 11:07:12 -------------------------------------------------------------------------------- Problem List Details Patient Name: Date of Service: [name] [name], [name] 07/10/2021 9:45 A M Medical Record Number: [id number] Patient Account Number: [id number] Date of Birth/Sex: Treating RN: June 18, 1971 (50 y.o. [name] [name] Primary Care Provider: Ferd [name] Other Clinician: Referring Provider: Treating Provider/Extender: [name] [name] in Treatment: 95 Active Problems ICD-10 Encounter Code Description Active Date MDM Diagnosis E11.621 Type 2 diabetes mellitus with foot ulcer 08/16/2020 No Yes S81.801D Unspecified  open wound, right lower leg, subsequent encounter 04/08/2021 No Yes L97.522 Non-pressure chronic ulcer of other part of left foot with fat layer exposed 08/16/2020 No Yes E11.42 Type  2 diabetes mellitus with diabetic polyneuropathy 08/16/2020 No Yes Z89.511 Acquired absence of right leg below knee 08/16/2020 No Yes Inactive Problems Resolved Problems Electronic Signature(s) Signed: 07/10/2021 11:12:05 AM By: Kalman Shan DO Entered By: Kalman [name] on 07/10/2021 10:58:11 -------------------------------------------------------------------------------- Progress Note Details Patient Name: Date of Service: [name] [name], [name] 07/10/2021 9:45 A M Medical Record Number: [id number] Patient Account Number: [id number] Date of Birth/Sex: Treating RN: 11/25/1970 (50 y.o. [name] [name], [name] [name] Primary Care Provider: Ferd [name] Other Clinician: Referring Provider: Treating Provider/Extender: [name] [name] in Treatment: 31 Subjective Chief Complaint Information obtained from Patient patient is here for a review of the wound on his plantar left first metatarsal head and back of his right leg History of Present Illness (HPI) 04/21/18 ADMISSION This is a 50 year old man who is a type II diabetic. In spite of fact his hemoglobin A1c is actually quite good 5.73 months ago he is at a really difficult time over the last 6-7 months. He developed a rapidly progressive infection in the right foot in November 2018 associated with osteomyelitis and necrotizing fasciitis. 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 The stump required and a revision on 11/24/17. The stump revision was advertised as being secondary to falls. I'm not sure the progressive history here however this area is actually closed over. The patient tells me over the same timeframe he has had wounds on the plantar aspect of his left foot. In the ED saw Dr. Sharol Given in April of this year. 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 It is really not clear that this patient is been dressing this with anything. He came in with the clinic without any specific  dressing on the wound areas using his own tennis shoe. He tells me that he only ambulates of course to do a pivot transfer. He does not have his prosthesis for the right leg as of yet and he blames Medicaid for this. He does however use the foot to push himself along in his wheelchair at home. The patient has not had formal arterial studies. ABI in our clinic on the left was 1.13. Patient's past medical history includes type 2 diabetes, fracture of the right fibula. I see that he was treated for abscesses on his right buttock and chest in 2016. I did not look at the microbiology of this. 04/28/18; patient comes back in the clinic today with the wound pretty much the same as when he came in here last week. Small opening lots of undermining relatively. He tells [place] that nothing is really been on this for 3 days in spite of the fact that we gave him enough to dress this easily especially such a small wound. He says he lives with his mother, she is not capable of assisting with this he is changing the dressings himself. 05/05/18; much better-looking wound today. Smaller. There is some undermining medially however that's only perhaps 2 mm. No surrounding erythema. We've been using silver collagen 05/12/18; small wound on the first metatarsal head. No undermining no surrounding erythema. We've been using silver collagen he has home health coming out to change 05/20/18 the wound on the first metatarsal head looks better. Covered in surface debris/callus nonviable tissue. Required debridement but post debridement this looks quite  good. We've been using silver collagen. He has home health 05/27/18; first metatarsal head wound continues to improve. Just about completely closed. Still a lot of surface debridement callus. We've been using silver collagen 06/06/18; the first metatarsal wound is completely healed over. There is still a lot of callus. I gently removed some of this just to make sure that there was no open  area and there is not. The patient mentions to me that his left leg has felt like "lead" for about a week READMISSION 08/16/2020 This is a 50 year old man with type 2 diabetes. He returns to clinic today with a 1 month history of a blister and callus over the first metatarsal head. This is in exactly the same area as when he was here in 2019. 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 He has standard running shoes on the left foot to match the area in his prosthesis. He has only been covering this area with a Band-Aid has not really been specifically dressing this. ABI in our clinic on the left was 1.16. This is essentially stable from the value in 2019 10/1 the patient has a linear wound over the left first metatarsal head. Again not a lot of callus thick skin around this that I removed with a #3 curette. This is not go deep to bone or muscle it does not look to be infected. 10/8 first metatarsal head. The wound looks like it is closing however this is going to be a difficult area to offload in the future. He has a size 15 shoe and he says his shoes are years old. The inserts in these current shoes are totally useless and I have advised him to replace these. He may need a new pair of shoes and I have he got original prosthesis at [facility] [place] I have asked him to go back there. 11/9; the patient has not been here in more than a month. Not sure he was healed when he was here the last time. I told him he would have to get new shoes and certainly offload the area over the left first metatarsal head. He has done nothing of the kind. He arrives back in clinic with the same old new balance sneakers. A very large separating callus over the first metatarsal head on the left 11/16; he has purchased new shoes and has at least 2 insoles like I asked. The wound is measuring much smaller. He is using silver alginate he changes the dressing himself 11/30;  wound is measurably smaller in length of about a half a centimeter. This is generally very little depth. We have been using silver alginate. He changes the dressing himself every second day. 12/14; wound is about the same size. Significant undermining medially relative to the size of the wound. We have been using silver alginate. There is not a way to offload this area as he walks with a prosthesis on the right leg 12/28; wound is about the same size. Again he has undermining medially. I am using polymen on this 12/02/2020; the wound looks smaller. Still a senescent edge. We have been using polymen 1/24; the wound is come down a few millimeters. Still a senescent edge. I have been using polymen 2/8; the wound is come down slightly. Still with slight undermining from 12-6 o'clock I have been using polymen partially to get offloading on this area which is opposed by his prosthesis on the  other leg. 2/22 quite open improvement he still has a comma shaped wound on the left first metatarsal head. Some depth. Using polymen 3/14; he still has the same problem of a comma shaped area over the left first metatarsal head. This seems to have had more depth today at 0.6 cm. We have been using polymen. The patient changes this himself every second day. I explored the idea of a total contact cast on the left leg however this is his driving foot. He was not enamored with the idea of not being able to drive and states that he does not have anybody else to get him around 3/28; again he comes in with a smaller looking wound however there is depth and undermining requiring debridement. We have been using Hydrofera Blue, changed to silver collagen today. The patient is doing the dressing himself 4/11; patient presents today for follow-up of his left diabetic foot wound. He denies any issues in the past 2 weeks. He is currently using silver collagen every other day with dressing changes. He does not use diabetic shoes or  special inserts to his sneakers. He does not use felt pads for offloading. 4/19; patient presents for his 1 week follow-up. He denies any issues. He reports using Hydrofera Blue and has not been using silver collagen for dressing changes. He reports minimal drainage to the wound. He overall feels well. 5/3; patient presents for 2-week follow-up. He has been using silver collagen without issues. He has no complaints today and states he overall feels well. 5/17; patient presents for 2-week follow-up. He states that the sleeve is rubbing up against the back of his right leg and causing a wound. He states this happened a week ago and has not been dressing the wound. The plantar wound is stable and he has been using collagen every other day. 5/24; Patient was had a recent hospitalization for cellulitis of his right popliteal fossa. He was admitted and started on IV antibiotics. He does not recall the plantar wound dressed while inpatient. He feels a lot better today. He was discharged on 5/21 on oral antibiotics and has not picked this up until today. He reports using collagen to the plantar wound since discharge. He is keeping the right posterior knee wound covered. He is getting a new sleeve for his prostatic on 5/26. He currently denies signs of infection. 6/1; patient presents for 1 week follow-up. He is still on doxycycline. He has not been dressing the back of the right knee wound. He states he is receiving his new prosthetic sleeve tomorrow. He states he is using silver alginate to the plantar foot wound. He currently denies signs of infection. He states he has not been offloading the left foot wound 6/7; patient presents for 1 week follow-up. He completes his last dose of doxycycline today. He finally received his prosthetic sleeve for the right side and he states it feels much better. He has been using antibiotic ointment to the back of that right knee wound. He has been using silver alginate to  the plantar foot wound. He currently denies signs of infection. 6/21; patient presents for 2-week follow-up. He reports no issues with his prosthetic sleeve. He has been using antibiotic ointment to the back of the right knee. He has been using silver alginate to the plantar foot wound. He has no issues or complaints today. He denies signs of infection. 7/5; patient presents for 2-week follow-up. He reports no issues or complaints today. He continues to use  antibiotic ointment to the right posterior knee wound and silver alginate to the left plantar foot wound. He denies signs of infection. 7/19; patient presents for 2-week follow-up. He continues to use antibiotic ointment to the right posterior knee and silver alginate to the left plantar foot wound. He has no issues or complaints today. He denies signs of infection. 8/4; patient presents for 2-week follow-up. He has no issues or complaints today. He denies signs of infection. 8/18; patient presents for 2-week follow-up. He has no issues or concerns today. He has been approved for Apligraf and he would like to have this placed today. He denies signs of infection and reports that his right posterior knee wound is closed. Patient History Information obtained from Patient. Family History Diabetes - Maternal Grandparents,Mother, Heart Disease - Father, Hypertension - Father, No family history of Cancer, Hereditary Spherocytosis, Kidney Disease, Lung Disease, Seizures, Stroke, Thyroid Problems, Tuberculosis. Social History Current every day smoker, Marital Status - Single, Alcohol Use - Never, Drug Use - No History, Caffeine Use - Never. Medical History Eyes Denies history of Cataracts, Glaucoma, Optic Neuritis Ear/Nose/Mouth/Throat Denies history of Chronic sinus problems/congestion, Middle ear problems Hematologic/Lymphatic Patient has history of Anemia Denies history of Hemophilia, Human Immunodeficiency Virus, Lymphedema, Sickle Cell  Disease Respiratory Denies history of Aspiration, Asthma, Chronic Obstructive Pulmonary Disease (COPD), Pneumothorax, Sleep Apnea, Tuberculosis Cardiovascular Patient has history of Hypertension, Peripheral Venous Disease Denies history of Angina, Arrhythmia, Congestive Heart Failure, Coronary Artery Disease, Deep Vein Thrombosis, Hypotension, Myocardial Infarction, Peripheral Arterial Disease, Phlebitis, Vasculitis Gastrointestinal Denies history of Cirrhosis , Colitis, Crohnoos, Hepatitis A, Hepatitis B, Hepatitis C Endocrine Patient has history of Type II Diabetes - oral meds Denies history of Type I Diabetes Genitourinary Denies history of End Stage Renal Disease Immunological Denies history of Lupus Erythematosus, Raynaudoos, Scleroderma Integumentary (Skin) Denies history of History of Burn Musculoskeletal Denies history of Gout, Rheumatoid Arthritis, Osteoarthritis, Osteomyelitis Neurologic Denies history of Dementia, Neuropathy, Quadriplegia, Paraplegia, Seizure Disorder Oncologic Denies history of Received Chemotherapy, Received Radiation Psychiatric Denies history of Anorexia/bulimia, Confinement Anxiety Hospitalization/Surgery History - fever 105, sepsis cellulitis right popliteal fossa 04/09/2021. Medical A Surgical History Notes [name] Constitutional Symptoms (General Health) falls , leukocytosis Respiratory During waking hours and catches himself not breathing, "gasp for air". Saw Dr Wynonia Lawman, he couldn't find a reason Objective Constitutional respirations regular, non-labored and within target range for patient.. Vitals Time Taken: 10:15 AM, Height: 77 in, Weight: 232 lbs, BMI: 27.5, Temperature: 98.6 F, Pulse: 95 bpm, Respiratory Rate: 17 breaths/min, Blood Pressure: 139/90 mmHg, Capillary Blood Glucose: 106 mg/dl. Psychiatric pleasant and cooperative. General Notes: Left first met head: Wound with granulation tissue and nonviable tissue present. Post debridement  healthy granulation tissue present. No signs of infection. Posterior right knee: Epithelialization to previous wound site. Integumentary (Hair, Skin) Wound #2R status is Open. Original cause of wound was Blister. The date acquired was: 06/23/2020. The wound has been in treatment 46 weeks. The wound is located on the Left Metatarsal head first. The wound measures 0.5cm length x 0.4cm width x 0.4cm depth; 0.157cm^2 area and 0.063cm^3 volume. There is Fat Layer (Subcutaneous Tissue) exposed. There is no tunneling noted, however, there is undermining starting at 12:00 and ending at 12:00 with a maximum distance of 0.6cm. There is a small amount of serosanguineous drainage noted. The wound margin is thickened. There is large (67-100%) red granulation within the wound bed. There is no necrotic tissue within the wound bed. Wound #3 status is Healed - Epithelialized. Original cause  of wound was Pressure Injury. The date acquired was: 03/31/2021. The wound has been in treatment 13 weeks. The wound is located on the Right,Posterior Knee. The wound measures 0cm length x 0cm width x 0cm depth; 0cm^2 area and 0cm^3 volume. There is a small amount of serosanguineous drainage noted. 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 The left first met head wound was debrided and has healthy granulation tissue present with no signs of infection. I applied Apligraf in standard fashion today. We will have patient follow-up in 1 week and reassess and likely reapply the next application. Procedures Wound #2R Pre-procedure diagnosis of Wound #2R is a Diabetic Wound/Ulcer of the Lower Extremity located on the Left  Metatarsal head first .Severity of Tissue Pre Debridement is: Fat layer exposed. There was a Excisional Skin/Subcutaneous Tissue Debridement with a total area of 0.2 sq cm performed by Kalman Shan, DO. With the following instrument(s): Curette to remove Viable and Non-Viable tissue/material. Material removed includes Callus, Subcutaneous Tissue, Skin: Dermis, and Skin: Epidermis after achieving pain control using Lidocaine. No specimens were taken. A time out was conducted at 10:37, prior to the start of the procedure. A Minimum amount of bleeding was controlled with Pressure. The procedure was tolerated well with a pain level of 0 throughout and a pain level of 0 following the procedure. Post Debridement Measurements: 0.5cm length x 0.4cm width x 0.4cm depth; 0.063cm^3 volume. Character of Wound/Ulcer Post Debridement is improved. Severity of Tissue Post Debridement is: Fat layer exposed. Post procedure Diagnosis Wound #2R: Same as Pre-Procedure Plan Follow-up Appointments: Return Appointment in 1 week. - Dr. Heber [place] Cellular or Tissue Based Products: Wound #2R Left Metatarsal head first: Cellular or Tissue Based Product Type: - Apligraf #1 Bathing/ Shower/ Hygiene: May shower and wash wound with soap and water. - WITH DRESSING CHANGES. Edema Control - Lymphedema / SCD / Other: Elevate legs to the level of the heart or above for 30 minutes daily and/or when sitting, a frequency of: Avoid standing for long periods of time. WOUND #2R: - Metatarsal head first Wound Laterality: Left Cleanser: Soap and Water 1 x Per Day/15 Days Discharge Instructions: May shower and wash wound with dial antibacterial soap and water prior to dressing change. Cleanser: Wound Cleanser (Generic) 1 x Per Day/15 Days Discharge Instructions: Cleanse the wound with wound cleanser prior to applying a clean dressing using gauze sponges, not tissue or cotton balls. Peri-Wound Care: Sween Lotion (Moisturizing lotion) 1  x Per Day/15 Days Discharge Instructions: Apply moisturizing lotion as directed Secondary Dressing: Woven Gauze Sponges 2x2 in (Generic) 1 x Per UVO/53 Days Discharge Instructions: Apply over primary dressing as directed. Secondary Dressing: Optifoam Non-Adhesive Dressing, 4x4 in (Generic) 1 x Per Day/15 Days Discharge Instructions: Apply over primary dressing cut to make foam donut to help offload Secured With: Conforming Stretch Gauze Bandage, Sterile 2x75 (in/in) (Generic) 1 x Per Day/15 Days Discharge Instructions: Secure with stretch gauze as directed. Secured With: 38M Medipore H Soft Cloth Surgical T ape, 2x2 (in/yd) (Generic) 1 x Per Day/15 Days Discharge Instructions: Secure dressing with tape as directed. 1. In office sharp debridement 2. Apligraf #1 placed in standard fashion 3. Follow-up in 1 week Electronic Signature(s) Signed: 07/10/2021 11:12:05 AM By:  Kalman Shan DO Entered By: Kalman [name] on 07/10/2021 11:11:21 -------------------------------------------------------------------------------- HxROS Details Patient Name: Date of Service: [name] [name], [name] 07/10/2021 9:45 A M Medical Record Number: [id number] Patient Account Number: [id number] Date of Birth/Sex: Treating RN: 1971/05/13 (50 y.o. [name] [name] Primary Care Provider: Ferd [name] Other Clinician: Referring Provider: Treating Provider/Extender: [name] [name] in Treatment: 36 Information Obtained From Patient Constitutional Symptoms (General Health) Medical History: Past Medical History Notes: falls , leukocytosis Eyes Medical History: Negative for: Cataracts; Glaucoma; Optic Neuritis Ear/Nose/Mouth/Throat Medical History: Negative for: Chronic sinus problems/congestion; Middle ear problems Hematologic/Lymphatic Medical History: Positive for: Anemia Negative for: Hemophilia; Human Immunodeficiency Virus; Lymphedema; Sickle Cell  Disease Respiratory Medical History: Negative for: Aspiration; Asthma; Chronic Obstructive Pulmonary Disease (COPD); Pneumothorax; Sleep Apnea; Tuberculosis Past Medical History Notes: During waking hours and catches himself not breathing, "gasp for air". Saw Dr Wynonia Lawman, he couldn't find a reason Cardiovascular Medical History: Positive for: Hypertension; Peripheral Venous Disease Negative for: Angina; Arrhythmia; Congestive Heart Failure; Coronary Artery Disease; Deep Vein Thrombosis; Hypotension; Myocardial Infarction; Peripheral Arterial Disease; Phlebitis; Vasculitis Gastrointestinal Medical History: Negative for: Cirrhosis ; Colitis; Crohns; Hepatitis A; Hepatitis B; Hepatitis C Endocrine Medical History: Positive for: Type II Diabetes - oral meds Negative for: Type I Diabetes Time with diabetes: 2014 Treated with: Oral agents Blood sugar tested every day: Yes Tested : Blood sugar testing results: Breakfast: 91 Genitourinary Medical History: Negative for: End Stage Renal Disease Immunological Medical History: Negative for: Lupus Erythematosus; Raynauds; Scleroderma Integumentary (Skin) Medical History: Negative for: History of Burn Musculoskeletal Medical History: Negative for: Gout; Rheumatoid Arthritis; Osteoarthritis; Osteomyelitis Neurologic Medical History: Negative for: Dementia; Neuropathy; Quadriplegia; Paraplegia; Seizure Disorder Oncologic Medical History: Negative for: Received Chemotherapy; Received Radiation Psychiatric Medical History: Negative for: Anorexia/bulimia; Confinement Anxiety Immunizations Pneumococcal Vaccine: Received Pneumococcal Vaccination: No Implantable Devices No devices added Hospitalization / Surgery History Type of Hospitalization/Surgery fever 105, sepsis cellulitis right popliteal fossa 04/09/2021 Family and Social History Cancer: No; Diabetes: Yes - Maternal Grandparents,Mother; Heart Disease: Yes - Father; Hereditary  Spherocytosis: No; Hypertension: Yes - Father; Kidney Disease: No; Lung Disease: No; Seizures: No; Stroke: No; Thyroid Problems: No; Tuberculosis: No; Current every day smoker; Marital Status - Single; Alcohol Use: Never; Drug Use: No History; Caffeine Use: Never; Financial Concerns: No; Food, Clothing or Shelter Needs: No; Support System Lacking: No; Transportation Concerns: No Electronic Signature(s) Signed: 07/10/2021 11:12:05 AM By: Kalman Shan DO Signed: 07/10/2021 5:10:54 PM By: Rhae Hammock RN Entered By: Kalman [name] on 07/10/2021 10:59:28 -------------------------------------------------------------------------------- SuperBill Details Patient Name: Date of Service: [name] [name], [name] 07/10/2021 Medical Record Number: [id number] Patient Account Number: [id number] Date of Birth/Sex: Treating RN: [birth year] (50 y.o. [name] [name], [name] [name] Primary Care Provider: Ferd [name] Other Clinician: Referring Provider: Treating Provider/Extender: [name] [name] in Treatment: 46 Diagnosis Coding ICD-10 Codes Code Description E11.621 Type 2 diabetes mellitus with foot ulcer S81.801D Unspecified open wound, right lower leg, subsequent encounter L97.522 Non-pressure chronic ulcer of other part of left foot with fat layer exposed E11.42 Type 2 diabetes mellitus with diabetic polyneuropathy Z89.511 Acquired absence of right leg below knee Facility Procedures CPT4: Code [id number](EM Description: 85 - DEB SUBQ TISSUE 20 SQ CM/< ICD-10 Diagnosis Description L97.522 Non-pressure chronic ulcer of other part of left foot with fat layer exposed cility Use Only) Apligraf 1 SQ CM Modifier: Quantity: 1 44 CPT4: 75449201 C5 or I Description: 007 Application of skin substitute graft to trunk, arms, legs, total wound surface area up  to 100 sq cm; first 25 less wound surface area CD-10 Diagnosis Description L97.522 Non-pressure chronic ulcer of other  part of left foot with fat  layer exposed Modifier: sqcm Quantity: 1 Physician Procedures CPT4: Description Modifier Code [id number] - WC PHYS SUBQ TISS 20 SQ CM ICD-10 Diagnosis Description L97.522 Non-pressure chronic ulcer of other part of left foot with fat layer exposed Quantity: 1 CPT4: S5681 Application of skin substitute graft to trunk, arms, legs, total wound surface area up to 100 sq cm; first 25 sqcm or less wound surface area ICD-10 Diagnosis Description L97.522 Non-pressure chronic ulcer of other part of left foot with fat  layer exposed Quantity: 1 Electronic Signature(s) Signed: 07/10/2021 11:12:05 AM By: Kalman Shan DO Entered By: Kalman [name] on 07/10/2021 11:11:45

## 2021-07-10 NOTE — Progress Notes (Signed)
BRISTOL, SOY ([id number]) Visit Report for 07/10/2021 Arrival Information Details Patient Name: Date of Service: [name] [name] 07/10/2021 9:45 A M Medical Record Number: [id number] Patient Account Number: [id number] Date of Birth/Sex: Treating RN: 1971/03/19 (50 y.o. [name] [name], [name] Primary Care [name] [name]: [name] [name] Other Clinician: Referring [name] [name]: Treating [name] [name]/Extender: [name] [name] in Treatment: 16 Visit Information History Since Last Visit Added or deleted any medications: No Patient Arrived: Ambulatory Any new allergies or adverse reactions: No Arrival Time: 10:15 Had a fall or experienced change in No Accompanied By: self activities of daily living that may affect Transfer Assistance: None risk of falls: Patient Identification Verified: Yes Signs or symptoms of abuse/neglect since last visito No Secondary Verification Process Completed: Yes Hospitalized since last visit: No Patient Requires Transmission-Based Precautions: No Implantable device outside of the clinic excluding No Patient Has Alerts: No cellular tissue based products placed in the center since last visit: Has Dressing in Place as Prescribed: Yes Pain Present Now: No Electronic Signature(s) Signed: 07/10/2021 5:10:54 PM By: Rhae Hammock RN Entered By: Rhae Hammock on 07/10/2021 10:15:23 -------------------------------------------------------------------------------- Lower Extremity Assessment Details Patient Name: Date of Service: [name] Shire ND, [name] 07/10/2021 9:45 A M Medical Record Number: [id number] Patient Account Number: [id number] Date of Birth/Sex: Treating RN: [birth year] (50 y.o. [name] [name], [name] Primary Care Jamilette Suchocki: [name] [name] Other Clinician: Referring [name] [name]: Treating [name] [name]/Extender: [name] [name] in Treatment: 46 Edema Assessment Assessed: [name] [name]: Yes] [name] [name]: No] Edema: [Left: Ye]  [Right: s] Calf Left: Right: Point of Measurement: 50 cm From Medial Instep 42.5 cm Ankle Left: Right: Point of Measurement: 11 cm From Medial Instep 27.8 cm Vascular Assessment Pulses: Dorsalis Pedis Palpable: [Left:Yes] Posterior Tibial Palpable: [Left:Yes] Electronic Signature(s) Signed: 07/10/2021 5:10:54 PM By: Rhae Hammock RN Entered By: Rhae Hammock on 07/10/2021 10:16:29 -------------------------------------------------------------------------------- Multi Wound Chart Details Patient Name: Date of Service: [name] Shire ND, [name] 07/10/2021 9:45 A M Medical Record Number: [id number] Patient Account Number: [id number] Date of Birth/Sex: Treating RN: 1971/08/31 (50 y.o. [name] [name], [name] Primary Care Nora Rooke: [name] [name] Other Clinician: Referring Brigido Mera: Treating Haider Hornaday/Extender: [name] [name] in Treatment: 26 Vital Signs Height(in): 77 Capillary Blood Glucose(mg/dl): 106 Weight(lbs): 232 Pulse(bpm): 95 Body Mass Index(BMI): 28 Blood Pressure(mmHg): 139/90 Temperature(F): 98.6 Respiratory Rate(breaths/min): 17 Photos: [2R:No Photos Left Metatarsal head first] [3:No Photos Right, Posterior Knee] [N/A:N/A N/A] Wound Location: [2R:Blister] [3:Pressure Injury] [N/A:N/A] Wounding Event: [2R:Diabetic Wound/Ulcer of the Lower] [3:Pressure Ulcer] [N/A:N/A] Primary Etiology: [2R:Extremity Anemia, Hypertension, Peripheral] [3:N/A] [N/A:N/A] Comorbid History: [2R:Venous Disease, Type II Diabetes 06/23/2020] [3:03/31/2021] [N/A:N/A] Date Acquired: [2R:46] [3:13] [N/A:N/A] Weeks of Treatment: [2R:Open] [3:Healed - Epithelialized] [N/A:N/A] Wound Status: [2R:Yes] [3:No] [N/A:N/A] Wound Recurrence: [2R:No] [3:Yes] [N/A:N/A] Clustered Wound: [2R:0.5x0.4x0.4] [3:0x0x0] [N/A:N/A] Measurements L x W x D (cm) [2R:0.157] [3:0] [N/A:N/A] A (cm) : rea [2R:0.063] [3:0] [N/A:N/A] Volume (cm) : [2R:-406.50%] [3:100.00%] [N/A:N/A] %  Reduction in A [2R:rea: -2000.00%] [3:100.00%] [N/A:N/A] % Reduction in Volume: [2R:12] Starting Position 1 (o'clock): [2R:12] Ending Position 1 (o'clock): [2R:0.6] Maximum Distance 1 (cm): [2R:Yes] [3:N/A] [N/A:N/A] Undermining: [2R:Grade 2] [3:Category/Stage II] [N/A:N/A] Classification: [2R:Small] [3:Small] [N/A:N/A] Exudate A mount: [2R:Serosanguineous] [3:Serosanguineous] [N/A:N/A] Exudate Type: [2R:red, brown] [3:red, brown] [N/A:N/A] Exudate Color: [2R:Thickened] [3:N/A] [N/A:N/A] Wound Margin: [2R:Large (67-100%)] [3:N/A] [N/A:N/A] Granulation A mount: [2R:Red] [3:N/A] [N/A:N/A] Granulation Quality: [2R:None Present (0%)] [3:N/A] [N/A:N/A] Necrotic A mount: [2R:Fat Layer (Subcutaneous Tissue): Yes N/A] [N/A:N/A] Exposed Structures: [2R:Fascia: No Tendon: No Muscle: No Joint: No Bone: No None] [3:N/A] [N/A:N/A] Epithelialization: [2R:Debridement -  Excisional] [3:N/A] [N/A:N/A] Debridement: Pre-procedure Verification/Time Out 10:37 [3:N/A] [N/A:N/A] Taken: [2R:Lidocaine] [3:N/A] [N/A:N/A] Pain Control: [2R:Callus, Subcutaneous] [3:N/A] [N/A:N/A] Tissue Debrided: [2R:Skin/Subcutaneous Tissue] [3:N/A] [N/A:N/A] Level: [2R:0.2] [3:N/A] [N/A:N/A] Debridement A (sq cm): [2R:rea Curette] [3:N/A] [N/A:N/A] Instrument: [2R:Minimum] [3:N/A] [N/A:N/A] Bleeding: [2R:Pressure] [3:N/A] [N/A:N/A] Hemostasis A chieved: [2R:0] [3:N/A] [N/A:N/A] Procedural Pain: [2R:0] [3:N/A] [N/A:N/A] Post Procedural Pain: [2R:Procedure was tolerated well] [3:N/A] [N/A:N/A] Debridement Treatment Response: [2R:0.5x0.4x0.4] [3:N/A] [N/A:N/A] Post Debridement Measurements L x W x D (cm) [2R:0.063] [3:N/A] [N/A:N/A] Post Debridement Volume: (cm) [2R:Debridement] [3:N/A] [N/A:N/A] Treatment Notes Electronic Signature(s) Signed: 07/10/2021 11:12:05 AM By: Kalman Shan DO Signed: 07/10/2021 5:10:54 PM By: Rhae Hammock RN Entered By: Kalman Shan on 07/10/2021  10:58:23 -------------------------------------------------------------------------------- Multi-Disciplinary Care Plan Details Patient Name: Date of Service: [name] Shire ND, [name] 07/10/2021 9:45 A M Medical Record Number: [id number] Patient Account Number: [id number] Date of Birth/Sex: Treating RN: 07-16-1971 (50 y.o. [name] [name], [name] Primary Care Marshae Azam: [name] [name] Other Clinician: Referring Becca Bayne: Treating Landry Lookingbill/Extender: [name] [name] in Treatment: [place] reviewed with physician Active Inactive Wound/Skin Impairment Nursing Diagnoses: Impaired tissue integrity Goals: Patient/caregiver will verbalize understanding of skin care regimen Date Initiated: 11/19/2020 Target Resolution Date: 06/13/2021 Goal Status: Active Ulcer/skin breakdown will have a volume reduction of 30% by week 4 Date Initiated: 08/16/2020 Date Inactivated: 03/03/2021 Target Resolution Date: 03/10/2021 Goal Status: Met Interventions: Provide education on ulcer and skin care Notes: Electronic Signature(s) Signed: 07/10/2021 5:10:54 PM By: Rhae Hammock RN Entered By: Rhae Hammock on 07/10/2021 10:30:53 -------------------------------------------------------------------------------- Pain Assessment Details Patient Name: Date of Service: [name] Shire ND, [name] 07/10/2021 9:45 A M Medical Record Number: [id number] Patient Account Number: [id number] Date of Birth/Sex: Treating RN: [birth year] (50 y.o. [name] [name], [name] Primary Care Graci Hulce: [name] [name] Other Clinician: Referring Arturo Sofranko: Treating Haeden Hudock/Extender: [name] [name] in Treatment: 43 Active Problems Location of Pain Severity and Description of Pain Patient Has Paino No Site Locations Pain Management and Medication Current Pain Management: Electronic Signature(s) Signed: 07/10/2021 5:10:54 PM By: Rhae Hammock RN Entered By:  Rhae Hammock on 07/10/2021 10:16:19 -------------------------------------------------------------------------------- Patient/Caregiver Education Details Patient Name: Date of Service: [name] [name] 8/18/2022andnbsp9:45 A M Medical Record Number: [id number] Patient Account Number: [id number] Date of Birth/Gender: Treating RN: 09-28-1971 (50 y.o. [name] Primary Care Physician: [name] [name] Other Clinician: Referring Physician: Treating Physician/Extender: [name] [name] in Treatment: 103 Education Assessment Education Provided To: Patient Education Topics Provided Wound/Skin Impairment: Methods: Explain/Verbal Responses: State content correctly [employer]) Signed: 07/10/2021 5:10:54 PM By: Rhae Hammock RN Entered By: Rhae Hammock on 07/10/2021 10:31:10 -------------------------------------------------------------------------------- Wound Assessment Details Patient Name: Date of Service: [name] Shire ND, [name] 07/10/2021 9:45 A M Medical Record Number: [id number] Patient Account Number: [id number] Date of Birth/Sex: Treating RN: 28-Jul-1971 (50 y.o. [name] [name], [name] Primary Care Zaryiah Barz: [name] [name] Other Clinician: Referring Felicia Both: Treating Minahil Quinlivan/Extender: [name] [name] in Treatment: 46 Wound Status Wound Number: [id number] Primary Diabetic Wound/Ulcer of the Lower Extremity Etiology: Wound Location: Left Metatarsal head first Wound Status: Open Wounding Event: Blister Comorbid Anemia, Hypertension, Peripheral Venous Disease, Type II Date Acquired: 06/23/2020 History: Diabetes Weeks Of Treatment: 46 Clustered Wound: No Wound Measurements Length: (cm) 0.5 Width: (cm) 0.4 Depth: (cm) 0.4 Area: (cm) 0.157 Volume: (cm) 0.063 % Reduction in Area: -406.5% % Reduction in Volume: -2000% Epithelialization: None Tunneling: No Undermining: Yes Starting Position  (o'clock): 12 Ending Position (o'clock): 12 Maximum Distance: (cm) 0.6 Wound Description Classification: Grade 2 Wound  Margin: Thickened Exudate Amount: Small Exudate Type: Serosanguineous Exudate Color: red, brown Foul Odor After Cleansing: No Slough/Fibrino No Wound Bed Granulation Amount: Large (67-100%) Exposed Structure Granulation Quality: Red Fascia Exposed: No Necrotic Amount: None Present (0%) Fat Layer (Subcutaneous Tissue) Exposed: Yes Tendon Exposed: No Muscle Exposed: No Joint Exposed: No Bone Exposed: No Electronic Signature(s) Signed: 07/10/2021 5:10:54 PM By: Rhae Hammock RN Entered By: Rhae Hammock on 07/10/2021 10:20:49 -------------------------------------------------------------------------------- Wound Assessment Details Patient Name: Date of Service: [name] Shire ND, [name] 07/10/2021 9:45 A M Medical Record Number: [id number] Patient Account Number: [id number] Date of Birth/Sex: Treating RN: 01/09/1971 (50 y.o. [name] [name], [name] Primary Care Nayanna Seaborn: [name] [name] Other Clinician: Referring Ola Raap: Treating Shalaina Guardiola/Extender: [name] [name] in Treatment: 46 Wound Status Wound Number: 3 Primary Etiology: Pressure Ulcer Wound Location: Right, Posterior Knee Wound Status: Healed - Epithelialized Wounding Event: Pressure Injury Date Acquired: 03/31/2021 Weeks Of Treatment: 13 Clustered Wound: Yes Wound Measurements Length: (cm) Width: (cm) Depth: (cm) Area: (cm) Volume: (cm) 0 % Reduction in Area: 100% 0 % Reduction in Volume: 100% 0 0 0 Wound Description Classification: Category/Stage II Exudate Amount: Small Exudate Type: Serosanguineous Exudate Color: red, brown Electronic Signature(s) Signed: 07/10/2021 5:10:54 PM By: Rhae Hammock RN Entered By: Rhae Hammock on 07/10/2021 10:21:07 -------------------------------------------------------------------------------- Vitals  Details Patient Name: Date of Service: [name], [name] 07/10/2021 9:45 A M Medical Record Number: [id number] Patient Account Number: [id number] Date of Birth/Sex: Treating RN: [birth year] (51 y.o. [name] [name], [name] Primary Care Rio Taber: [name] [name] Other Clinician: Referring Tobi Leinweber: Treating Prather Failla/Extender: [name] [name] in Treatment: 50 Vital Signs Time Taken: 10:15 Temperature (F): 98.6 Height (in): 77 Pulse (bpm): 95 Weight (lbs): 232 Respiratory Rate (breaths/min): 17 Body Mass Index (BMI): 27.5 Blood Pressure (mmHg): 139/90 Capillary Blood Glucose (mg/dl): 106 Reference Range: 80 - 120 mg / dl Electronic Signature(s) Signed: 07/10/2021 5:10:54 PM By: Rhae Hammock RN Entered By: Rhae Hammock on 07/10/2021 10:16:14

## 2021-07-17 ENCOUNTER — Other Ambulatory Visit (HOSPITAL_COMMUNITY)
Admission: RE | Admit: 2021-07-17 | Discharge: 2021-07-17 | Disposition: A | Discharge status: Home / Self Care | Payer: Medicare Other | Source: Other Acute Inpatient Hospital | Attending: Internal Medicine | Admitting: Internal Medicine

## 2021-07-17 ENCOUNTER — Encounter (HOSPITAL_BASED_OUTPATIENT_CLINIC_OR_DEPARTMENT_OTHER): Payer: Medicare Other | Admitting: Internal Medicine

## 2021-07-17 ENCOUNTER — Other Ambulatory Visit: Payer: Self-pay

## 2021-07-17 DIAGNOSIS — L97522 Non-pressure chronic ulcer of other part of left foot with fat layer exposed: Secondary | ICD-10-CM | POA: Diagnosis not present

## 2021-07-17 DIAGNOSIS — E11621 Type 2 diabetes mellitus with foot ulcer: Secondary | ICD-10-CM | POA: Insufficient documentation

## 2021-07-21 LAB — AEROBIC CULTURE W GRAM STAIN (SUPERFICIAL SPECIMEN)

## 2021-07-22 NOTE — Progress Notes (Signed)
[name] [name], [name] [name] ([id number]) Visit Report for 07/17/2021 Chief Complaint Document Details Patient Name: Date of Service: [name] [name] [name] 07/17/2021 11:00 A M Medical Record Number: [id number] Patient Account Number: [id number] Date of Birth/Sex: Treating RN: 1971-10-09 (50 y.o. [name] [name], [name] [name] Primary Care Provider: [name] Other Clinician: Referring Provider: Treating Provider/Extender: Delano Metz in Treatment: 34 Information Obtained from: Patient Chief Complaint patient is here for a review of the wound on his plantar left first metatarsal head and back of his right leg Electronic Signature(s) Signed: 07/17/2021 11:53:38 AM By: Kalman Shan DO Entered By: Kalman Shan on 07/17/2021 11:36:26 -------------------------------------------------------------------------------- Cellular or Tissue Based Product Details Patient Name: Date of Service: [name], [name] 07/17/2021 11:00 A M Medical Record Number: [id number] Patient Account Number: [id number] Date of Birth/Sex: Treating RN: Mar 20, 1971 (50 y.o. [name] [name], [name] [name] Primary Care Provider: [name] Other Clinician: Referring Provider: Treating Provider/Extender: Delano Metz in Treatment: 73 Cellular or Tissue Based Product Type Wound #2R Left Metatarsal head first Applied to: Performed By: Physician Kalman Shan, DO Cellular or Tissue Based Product Type: Apligraf Level of Consciousness (Pre-procedure): Awake and Alert Pre-procedure Verification/Time Out Yes - 11:30 Taken: Location: genitalia / hands / feet / multiple digits Wound Size (sq cm): 0.08 Product Size (sq cm): 44 Waste Size (sq cm): 33 Waste Reason: wound siz Amount of Product Applied (sq cm): 11 Instrument Used: Blade, Forceps Lot #: [id number] Expiration Date: 07/24/2021 Fenestrated: Yes Instrument: Blade Reconstituted: Yes Solution Type: NS Solution  Amount: 43mL Lot #: [id number] Solution Expiration Date: 07/24/2022 Secured: Yes Secured With: Steri-Strips Dressing Applied: Yes Primary Dressing: adapticc Procedural Pain: 0 Post Procedural Pain: 0 Response to Treatment: Procedure was tolerated well Level of Consciousness (Post- Awake and Alert procedure): Post Procedure Diagnosis Same as Pre-procedure Electronic Signature(s) Signed: 07/17/2021 11:53:38 AM By: Kalman Shan DO Signed: 07/22/2021 5:24:19 PM By: Rhae Hammock RN Entered By: Rhae Hammock on 07/17/2021 11:32:31 -------------------------------------------------------------------------------- HPI Details Patient Name: Date of Service: [name] [name] ND, [name] 07/17/2021 11:00 A M Medical Record Number: [id number] Patient Account Number: [id number] Date of Birth/Sex: Treating RN: 1971/04/30 (50 y.o. [name] [name] Primary Care Provider: [name] Other Clinician: Referring Provider: Treating Provider/Extender: Delano Metz in Treatment: 48 History of Present Illness HPI Description: 04/21/18 ADMISSION This is a 50 year old man who is a type II diabetic. In spite of fact his hemoglobin A1c is actually quite good 5.73 months ago he is at a really difficult time over the last 6-7 months. He developed a rapidly progressive infection in the right foot in November 2018 associated with osteomyelitis and necrotizing fasciitis. 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 The stump required and a revision on 11/24/17. The stump revision was advertised as being secondary to falls. I'm not sure the progressive history here however this area is actually closed over. The patient tells me over the same timeframe he has had wounds on the plantar aspect of his left foot. In the ED saw Dr. Sharol Given in April of this year. 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 It is really not clear that this patient is been  dressing this with anything. He came in with the clinic without any specific dressing on the wound areas using his own tennis shoe. He tells me that he only ambulates of course to do a pivot transfer. He does not have his prosthesis for the  right leg as of yet and he blames Medicaid for this. He does however use the foot to push himself along in his wheelchair at home. The patient has not had formal arterial studies. ABI in our clinic on the left was 1.13. Patient's past medical history includes type 2 diabetes, fracture of the right fibula. I see that he was treated for abscesses on his right buttock and chest in 2016. I did not look at the microbiology of this. 04/28/18; patient comes back in the clinic today with the wound pretty much the same as when he came in here last week. Small opening lots of undermining relatively. He tells [place] that nothing is really been on this for 3 days in spite of the fact that we gave him enough to dress this easily especially such a small wound. He says he lives with his mother, she is not capable of assisting with this he is changing the dressings himself. 05/05/18; much better-looking wound today. Smaller. There is some undermining medially however that's only perhaps 2 mm. No surrounding erythema. We've been using silver collagen 05/12/18; small wound on the first metatarsal head. No undermining no surrounding erythema. We've been using silver collagen he has home health coming out to change 05/20/18 the wound on the first metatarsal head looks better. Covered in surface debris/callus nonviable tissue. Required debridement but post debridement this looks quite good. We've been using silver collagen. He has home health 05/27/18; first metatarsal head wound continues to improve. Just about completely closed. Still a lot of surface debridement callus. We've been using silver collagen 06/06/18; the first metatarsal wound is completely healed over. There is still a lot of  callus. I gently removed some of this just to make sure that there was no open area and there is not. The patient mentions to me that his left leg has felt like "lead" for about a week READMISSION 08/16/2020 This is a 50 year old man with type 2 diabetes. He returns to clinic today with a 1 month history of a blister and callus over the first metatarsal head. This is in exactly the same area as when he was here in 2019. 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 He has standard running shoes on the left foot to match the area in his prosthesis. He has only been covering this area with a Band-Aid has not really been specifically dressing this. ABI in our clinic on the left was 1.16. This is essentially stable from the value in 2019 10/1 the patient has a linear wound over the left first metatarsal head. Again not a lot of callus thick skin around this that I removed with a #3 curette. This is not go deep to bone or muscle it does not look to be infected. 10/8 first metatarsal head. The wound looks like it is closing however this is going to be a difficult area to offload in the future. He has a size 15 shoe and he says his shoes are years old. The inserts in these current shoes are totally useless and I have advised him to replace these. He may need a new pair of shoes and I have he got original prosthesis at [facility] [place] I have asked him to go back there. 11/9; the patient has not been here in more than a month. Not sure he was healed when he was here the last time. I told him he would have  to get new shoes and certainly offload the area over the left first metatarsal head. He has done nothing of the kind. He arrives back in clinic with the same old new balance sneakers. A very large separating callus over the first metatarsal head on the left 11/16; he has purchased new shoes and has at least 2 insoles like I asked. The wound is measuring much  smaller. He is using silver alginate he changes the dressing himself 11/30; wound is measurably smaller in length of about a half a centimeter. This is generally very little depth. We have been using silver alginate. He changes the dressing himself every second day. 12/14; wound is about the same size. Significant undermining medially relative to the size of the wound. We have been using silver alginate. There is not a way to offload this area as he walks with a prosthesis on the right leg 12/28; wound is about the same size. Again he has undermining medially. I am using polymen on this 12/02/2020; the wound looks smaller. Still a senescent edge. We have been using polymen 1/24; the wound is come down a few millimeters. Still a senescent edge. I have been using polymen 2/8; the wound is come down slightly. Still with slight undermining from 12-6 o'clock I have been using polymen partially to get offloading on this area which is opposed by his prosthesis on the other leg. 2/22 quite open improvement he still has a comma shaped wound on the left first metatarsal head. Some depth. Using polymen 3/14; he still has the same problem of a comma shaped area over the left first metatarsal head. This seems to have had more depth today at 0.6 cm. We have been using polymen. The patient changes this himself every second day. I explored the idea of a total contact cast on the left leg however this is his driving foot. He was not enamored with the idea of not being able to drive and states that he does not have anybody else to get him around 3/28; again he comes in with a smaller looking wound however there is depth and undermining requiring debridement. We have been using Hydrofera Blue, changed to silver collagen today. The patient is doing the dressing himself 4/11; patient presents today for follow-up of his left diabetic foot wound. He denies any issues in the past 2 weeks. He is currently using silver  collagen every other day with dressing changes. He does not use diabetic shoes or special inserts to his sneakers. He does not use felt pads for offloading. 4/19; patient presents for his 1 week follow-up. He denies any issues. He reports using Hydrofera Blue and has not been using silver collagen for dressing changes. He reports minimal drainage to the wound. He overall feels well. 5/3; patient presents for 2-week follow-up. He has been using silver collagen without issues. He has no complaints today and states he overall feels well. 5/17; patient presents for 2-week follow-up. He states that the sleeve is rubbing up against the back of his right leg and causing a wound. He states this happened a week ago and has not been dressing the wound. The plantar wound is stable and he has been using collagen every other day. 5/24; Patient was had a recent hospitalization for cellulitis of his right popliteal fossa. He was admitted and started on IV antibiotics. He does not recall the plantar wound dressed while inpatient. He feels a lot better today. He was discharged on 5/21 on oral antibiotics  and has not picked this up until today. He reports using collagen to the plantar wound since discharge. He is keeping the right posterior knee wound covered. He is getting a new sleeve for his prostatic on 5/26. He currently denies signs of infection. 6/1; patient presents for 1 week follow-up. He is still on doxycycline. He has not been dressing the back of the right knee wound. He states he is receiving his new prosthetic sleeve tomorrow. He states he is using silver alginate to the plantar foot wound. He currently denies signs of infection. He states he has not been offloading the left foot wound 6/7; patient presents for 1 week follow-up. He completes his last dose of doxycycline today. He finally received his prosthetic sleeve for the right side and he states it feels much better. He has been using antibiotic  ointment to the back of that right knee wound. He has been using silver alginate to the plantar foot wound. He currently denies signs of infection. 6/21; patient presents for 2-week follow-up. He reports no issues with his prosthetic sleeve. He has been using antibiotic ointment to the back of the right knee. He has been using silver alginate to the plantar foot wound. He has no issues or complaints today. He denies signs of infection. 7/5; patient presents for 2-week follow-up. He reports no issues or complaints today. He continues to use antibiotic ointment to the right posterior knee wound and silver alginate to the left plantar foot wound. He denies signs of infection. 7/19; patient presents for 2-week follow-up. He continues to use antibiotic ointment to the right posterior knee and silver alginate to the left plantar foot wound. He has no issues or complaints today. He denies signs of infection. 8/4; patient presents for 2-week follow-up. He has no issues or complaints today. He denies signs of infection. 8/18; patient presents for 2-week follow-up. He has no issues or concerns today. He has been approved for Apligraf and he would like to have this placed today. He denies signs of infection and reports that his right posterior knee wound is closed. 8/25; patient presents for 1 week follow-up. He had no issues with his first Apligraf placement last week. He did however develop an abscess to the back of his right knee. He reports tenderness to this area. He denies systemic signs of infection. Electronic Signature(s) Signed: 07/17/2021 11:53:38 AM By: Kalman Shan DO Entered By: Kalman Shan on 07/17/2021 11:41:15 -------------------------------------------------------------------------------- Physical Exam Details Patient Name: Date of Service: [name], [place] 07/17/2021 11:00 A M Medical Record Number: [id number] Patient Account Number: [id number] Date of Birth/Sex: Treating  RN: 1971-08-23 (50 y.o. [name] [name] Primary Care Provider: [name] Other Clinician: Referring Provider: Treating Provider/Extender: Delano Metz in Treatment: 24 Constitutional respirations regular, non-labored and within target range for patient.. Cardiovascular 2+ dorsalis pedis/posterior tibialis pulses. Psychiatric pleasant and cooperative. Notes Left first met head: Wound with granulation tissue present. No signs of infection. Posterior right knee: Pinpoint opening with significant purulent drainage on palpation. Tenderness to palpation. No increased warmth or redness to the area. Electronic Signature(s) Signed: 07/17/2021 11:53:38 AM By: Kalman Shan DO Entered By: Kalman Shan on 07/17/2021 11:42:31 -------------------------------------------------------------------------------- Physician Orders Details Patient Name: Date of Service: [name] [name] ND, [name] 07/17/2021 11:00 A M Medical Record Number: [id number] Patient Account Number: [id number] Date of Birth/Sex: Treating RN: 11/28/1970 (50 y.o. 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 - Dr. [facility] Return A Cellular or Tissue Based Products Wound #2R Left Metatarsal head first Cellular or Tissue Based Product Type: - Apligraf #2 Bathing/ Shower/ Hygiene May shower and wash wound with soap and water. -  WITH DRESSING CHANGES. Edema Control - Lymphedema / SCD / Other Elevate legs to the level of the heart or above for 30 minutes daily and/or when sitting, a frequency of: Avoid standing for long periods of time. Wound Treatment Wound #2R - Metatarsal head first Wound Laterality: Left Cleanser: Soap and Water 1 x Per DEY/81 Days Discharge Instructions: May shower and wash wound with dial antibacterial soap and water prior to dressing change. Cleanser: Wound Cleanser (Generic) 1 x Per Day/15 Days Discharge Instructions: Cleanse the wound with wound cleanser prior to applying a clean dressing using gauze sponges, not tissue or cotton balls. Peri-Wound Care: Sween Lotion (Moisturizing lotion) 1 x Per Day/15 Days Discharge Instructions: Apply moisturizing lotion as directed Secondary Dressing: Woven Gauze Sponges 2x2 in (Generic) 1 x Per KGY/18 Days Discharge Instructions: Apply over primary dressing as directed. Secondary Dressing: Optifoam Non-Adhesive Dressing, 4x4 in (Generic) 1 x Per Day/15 Days Discharge Instructions: Apply over primary dressing cut to make foam donut to help offload Secured With: Conforming Stretch Gauze Bandage, Sterile 2x75 (in/in) (Generic) 1 x Per Day/15 Days Discharge Instructions: Secure with stretch gauze as directed. Secured With: 18M Medipore H Soft Cloth Surgical Tape, 2x2 (in/yd) (Generic) 1 x Per Day/15 Days Discharge Instructions: Secure dressing with tape as directed. Wound #3R - Knee Wound Laterality: Right, Posterior Topical: Triple Antibiotic Ointment, 1 (oz) Tube 2 x Per Day/15 Days Secondary Dressing: Woven Gauze Sponge, Non-Sterile 4x4 in 2 x Per Day/15 Days Discharge Instructions: Apply over primary dressing as directed. Secured With: [employer], 4.5x3.1 (in/yd) 2 x Per Day/15 Days Discharge Instructions: Secure with Kerlix as directed. Secured With: 18M Medipore H Soft Cloth Surgical T 4 x 2 (in/yd) 2 x Per Day/15 Days ape Discharge  Instructions: Secure dressing with tape as directed. Laboratory naerobe culture (MICRO) - right post kne Bacteria identified in Unspecified specimen by A LOINC Code: 563-1 Convenience Name: Anerobic culture Patient Medications llergies: adhesive tape A Notifications Medication Indication Start End 07/17/2021 doxycycline hyclate DOSE 1 - oral 100 mg tablet - 1 tablet twice daily for 10 days Electronic Signature(s) Signed: 07/17/2021 11:51:07 AM By: Kalman Shan DO Entered By: Kalman Shan on 07/17/2021 11:51:06 -------------------------------------------------------------------------------- Problem List Details Patient Name: Date of Service: [name] [name] ND, [name] 07/17/2021 11:00 A M Medical Record Number: [id number] Patient Account Number: [id number] Date of Birth/Sex: Treating RN: 1971-08-23 (50 y.o. [name] [name], [name] [name] Primary Care Provider: [name] Other Clinician: Referring Provider: Treating Provider/Extender: Delano Metz in Treatment: 75 Active Problems ICD-10 Encounter Code Description Active Date MDM Diagnosis E11.621 Type 2 diabetes mellitus with foot ulcer 08/16/2020 No Yes S81.801D Unspecified open wound, right lower leg, subsequent encounter 04/08/2021 No Yes L97.522 Non-pressure chronic ulcer of other part of left foot with fat layer exposed 08/16/2020 No Yes E11.42 Type 2 diabetes mellitus with diabetic polyneuropathy 08/16/2020 No  Yes Z89.511 Acquired absence of right leg below knee 08/16/2020 No Yes Inactive Problems Resolved Problems Electronic Signature(s) Signed: 07/17/2021 11:53:38 AM By: Kalman Shan DO Entered By: Kalman Shan on 07/17/2021 11:34:15 -------------------------------------------------------------------------------- Progress Note Details Patient Name: Date of Service: [name], [place] 07/17/2021 11:00 A M Medical Record Number: [id number] Patient Account Number: [id number] Date of  Birth/Sex: Treating RN: 02-23-1971 (50 y.o. [name] [name], [name] [name] Primary Care Provider: [name] Other Clinician: Referring Provider: Treating Provider/Extender: Delano Metz in Treatment: 70 Subjective Chief Complaint Information obtained from Patient patient is here for a review of the wound on his plantar left first metatarsal head and back of his right leg History of Present Illness (HPI) 04/21/18 ADMISSION This is a 50 year old man who is a type II diabetic. In spite of fact his hemoglobin A1c is actually quite good 5.73 months ago he is at a really difficult time over the last 6-7 months. He developed a rapidly progressive infection in the right foot in November 2018 associated with osteomyelitis and necrotizing fasciitis. 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 The stump required and a revision on 11/24/17. The stump revision was advertised as being secondary to falls. I'm not sure the progressive history here however this area is actually closed over. The patient tells me over the same timeframe he has had wounds on the plantar aspect of his left foot. In the ED saw Dr. Sharol Given in April of this year. 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 It is really not clear that this patient is been dressing this with anything. He came in with the clinic without any specific dressing on the wound areas using his own tennis shoe. He tells me that he only ambulates of course to do a pivot transfer. He does not have his prosthesis for the right leg as of yet and he blames Medicaid for this. He does however use the foot to push himself along in his wheelchair at home. The patient has not had formal arterial studies. ABI in our clinic on the left was 1.13. Patient's past medical history includes type 2 diabetes, fracture of the right fibula. I see that he was treated for abscesses on his right buttock and chest in 2016. I did  not look at the microbiology of this. 04/28/18; patient comes back in the clinic today with the wound pretty much the same as when he came in here last week. Small opening lots of undermining relatively. He tells [place] that nothing is really been on this for 3 days in spite of the fact that we gave him enough to dress this easily especially such a small wound. He says he lives with his mother, she is not capable of assisting with this he is changing the dressings himself. 05/05/18; much better-looking wound today. Smaller. There is some undermining medially however that's only perhaps 2 mm. No surrounding erythema. We've been using silver collagen 05/12/18; small wound on the first metatarsal head. No undermining no surrounding erythema. We've been using silver collagen he has home health coming out to change 05/20/18 the wound on the first metatarsal head looks better. Covered in surface debris/callus nonviable tissue. Required debridement but post debridement this looks quite good. We've been using silver collagen. He has home health 05/27/18; first metatarsal head wound continues to improve. Just about completely closed. Still a lot of surface debridement callus. We've been using silver collagen 06/06/18; the first  metatarsal wound is completely healed over. There is still a lot of callus. I gently removed some of this just to make sure that there was no open area and there is not. The patient mentions to me that his left leg has felt like "lead" for about a week READMISSION 08/16/2020 This is a 50 year old man with type 2 diabetes. He returns to clinic today with a 1 month history of a blister and callus over the first metatarsal head. This is in exactly the same area as when he was here in 2019. 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 He has standard running shoes on the left foot to match the area in his prosthesis. He has only been covering this area with  a Band-Aid has not really been specifically dressing this. ABI in our clinic on the left was 1.16. This is essentially stable from the value in 2019 10/1 the patient has a linear wound over the left first metatarsal head. Again not a lot of callus thick skin around this that I removed with a #3 curette. This is not go deep to bone or muscle it does not look to be infected. 10/8 first metatarsal head. The wound looks like it is closing however this is going to be a difficult area to offload in the future. He has a size 15 shoe and he says his shoes are years old. The inserts in these current shoes are totally useless and I have advised him to replace these. He may need a new pair of shoes and I have he got original prosthesis at [facility] [place] I have asked him to go back there. 11/9; the patient has not been here in more than a month. Not sure he was healed when he was here the last time. I told him he would have to get new shoes and certainly offload the area over the left first metatarsal head. He has done nothing of the kind. He arrives back in clinic with the same old new balance sneakers. A very large separating callus over the first metatarsal head on the left 11/16; he has purchased new shoes and has at least 2 insoles like I asked. The wound is measuring much smaller. He is using silver alginate he changes the dressing himself 11/30; wound is measurably smaller in length of about a half a centimeter. This is generally very little depth. We have been using silver alginate. He changes the dressing himself every second day. 12/14; wound is about the same size. Significant undermining medially relative to the size of the wound. We have been using silver alginate. There is not a way to offload this area as he walks with a prosthesis on the right leg 12/28; wound is about the same size. Again he has undermining medially. I am using polymen on this 12/02/2020; the wound looks smaller. Still a  senescent edge. We have been using polymen 1/24; the wound is come down a few millimeters. Still a senescent edge. I have been using polymen 2/8; the wound is come down slightly. Still with slight undermining from 12-6 o'clock I have been using polymen partially to get offloading on this area which is opposed by his prosthesis on the other leg. 2/22 quite open improvement he still has a comma shaped wound on the left first metatarsal head. Some depth. Using polymen 3/14; he still has the same problem of a comma shaped area over the  left first metatarsal head. This seems to have had more depth today at 0.6 cm. We have been using polymen. The patient changes this himself every second day. I explored the idea of a total contact cast on the left leg however this is his driving foot. He was not enamored with the idea of not being able to drive and states that he does not have anybody else to get him around 3/28; again he comes in with a smaller looking wound however there is depth and undermining requiring debridement. We have been using Hydrofera Blue, changed to silver collagen today. The patient is doing the dressing himself 4/11; patient presents today for follow-up of his left diabetic foot wound. He denies any issues in the past 2 weeks. He is currently using silver collagen every other day with dressing changes. He does not use diabetic shoes or special inserts to his sneakers. He does not use felt pads for offloading. 4/19; patient presents for his 1 week follow-up. He denies any issues. He reports using Hydrofera Blue and has not been using silver collagen for dressing changes. He reports minimal drainage to the wound. He overall feels well. 5/3; patient presents for 2-week follow-up. He has been using silver collagen without issues. He has no complaints today and states he overall feels well. 5/17; patient presents for 2-week follow-up. He states that the sleeve is rubbing up against the back of  his right leg and causing a wound. He states this happened a week ago and has not been dressing the wound. The plantar wound is stable and he has been using collagen every other day. 5/24; Patient was had a recent hospitalization for cellulitis of his right popliteal fossa. He was admitted and started on IV antibiotics. He does not recall the plantar wound dressed while inpatient. He feels a lot better today. He was discharged on 5/21 on oral antibiotics and has not picked this up until today. He reports using collagen to the plantar wound since discharge. He is keeping the right posterior knee wound covered. He is getting a new sleeve for his prostatic on 5/26. He currently denies signs of infection. 6/1; patient presents for 1 week follow-up. He is still on doxycycline. He has not been dressing the back of the right knee wound. He states he is receiving his new prosthetic sleeve tomorrow. He states he is using silver alginate to the plantar foot wound. He currently denies signs of infection. He states he has not been offloading the left foot wound 6/7; patient presents for 1 week follow-up. He completes his last dose of doxycycline today. He finally received his prosthetic sleeve for the right side and he states it feels much better. He has been using antibiotic ointment to the back of that right knee wound. He has been using silver alginate to the plantar foot wound. He currently denies signs of infection. 6/21; patient presents for 2-week follow-up. He reports no issues with his prosthetic sleeve. He has been using antibiotic ointment to the back of the right knee. He has been using silver alginate to the plantar foot wound. He has no issues or complaints today. He denies signs of infection. 7/5; patient presents for 2-week follow-up. He reports no issues or complaints today. He continues to use antibiotic ointment to the right posterior knee wound and silver alginate to the left plantar foot  wound. He denies signs of infection. 7/19; patient presents for 2-week follow-up. He continues to use antibiotic ointment to the right  posterior knee and silver alginate to the left plantar foot wound. He has no issues or complaints today. He denies signs of infection. 8/4; patient presents for 2-week follow-up. He has no issues or complaints today. He denies signs of infection. 8/18; patient presents for 2-week follow-up. He has no issues or concerns today. He has been approved for Apligraf and he would like to have this placed today. He denies signs of infection and reports that his right posterior knee wound is closed. 8/25; patient presents for 1 week follow-up. He had no issues with his first Apligraf placement last week. He did however develop an abscess to the back of his right knee. He reports tenderness to this area. He denies systemic signs of infection. Patient History Information obtained from Patient. Family History Diabetes - Maternal Grandparents,Mother, Heart Disease - Father, Hypertension - Father, No family history of Cancer, Hereditary Spherocytosis, Kidney Disease, Lung Disease, Seizures, Stroke, Thyroid Problems, Tuberculosis. Social History Current every day smoker, Marital Status - Single, Alcohol Use - Never, Drug Use - No History, Caffeine Use - Never. Medical History Eyes Denies history of Cataracts, Glaucoma, Optic Neuritis Ear/Nose/Mouth/Throat Denies history of Chronic sinus problems/congestion, Middle ear problems Hematologic/Lymphatic Patient has history of Anemia Denies history of Hemophilia, Human Immunodeficiency Virus, Lymphedema, Sickle Cell Disease Respiratory Denies history of Aspiration, Asthma, Chronic Obstructive Pulmonary Disease (COPD), Pneumothorax, Sleep Apnea, Tuberculosis Cardiovascular Patient has history of Hypertension, Peripheral Venous Disease Denies history of Angina, Arrhythmia, Congestive Heart Failure, Coronary Artery Disease, Deep  Vein Thrombosis, Hypotension, Myocardial Infarction, Peripheral Arterial Disease, Phlebitis, Vasculitis Gastrointestinal Denies history of Cirrhosis , Colitis, Crohnoos, Hepatitis A, Hepatitis B, Hepatitis C Endocrine Patient has history of Type II Diabetes - oral meds Denies history of Type I Diabetes Genitourinary Denies history of End Stage Renal Disease Immunological Denies history of Lupus Erythematosus, Raynaudoos, Scleroderma Integumentary (Skin) Denies history of History of Burn Musculoskeletal Denies history of Gout, Rheumatoid Arthritis, Osteoarthritis, Osteomyelitis Neurologic Denies history of Dementia, Neuropathy, Quadriplegia, Paraplegia, Seizure Disorder Oncologic Denies history of Received Chemotherapy, Received Radiation Psychiatric Denies history of Anorexia/bulimia, Confinement Anxiety Hospitalization/Surgery History - fever 105, sepsis cellulitis right popliteal fossa 04/09/2021. Medical A Surgical History Notes nd Constitutional Symptoms (General Health) falls , leukocytosis Respiratory During waking hours and catches himself not breathing, "gasp for air". Saw Dr Wynonia Lawman, he couldn't find a reason Objective Constitutional respirations regular, non-labored and within target range for patient.. Vitals Time Taken: 11:04 AM, Height: 77 in, Weight: 232 lbs, BMI: 27.5, Temperature: 98.2 F, Pulse: 90 bpm, Respiratory Rate: 17 breaths/min, Blood Pressure: 155/98 mmHg, Capillary Blood Glucose: 137 mg/dl. Cardiovascular 2+ dorsalis pedis/posterior tibialis pulses. Psychiatric pleasant and cooperative. General Notes: Left first met head: Wound with granulation tissue present. No signs of infection. Posterior right knee: Pinpoint opening with significant purulent drainage on palpation. Tenderness to palpation. No increased warmth or redness to the area. Integumentary (Hair, Skin) Wound #2R status is Open. Original cause of wound was Blister. The date acquired was:  06/23/2020. The wound has been in treatment 47 weeks. The wound is located on the Left Metatarsal head first. The wound measures 0.4cm length x 0.2cm width x 0.2cm depth; 0.063cm^2 area and 0.013cm^3 volume. There is Fat Layer (Subcutaneous Tissue) exposed. There is no tunneling noted, however, there is undermining starting at 12:00 and ending at 12:00 with a maximum distance of 0.2cm. There is a small amount of serosanguineous drainage noted. The wound margin is thickened. There is large (67-100%) red granulation within the wound bed. There  is no necrotic tissue within the wound bed. Wound #3R status is Open. Original cause of wound was Pressure Injury. The date acquired was: 03/31/2021. The wound has been in treatment 14 weeks. The wound is located on the Right,Posterior Knee. The wound measures 0.2cm length x 0.2cm width x 1.4cm depth; 0.031cm^2 area and 0.044cm^3 volume. There is Fat Layer (Subcutaneous Tissue) exposed. Tunneling has been noted at 6:00 with a maximum distance of 2cm. Undermining begins at 12:00 and ends at 12:00 with a maximum distance of 1cm. There is a medium amount of purulent drainage noted. The wound margin is flat and intact. There is large (67-100%) pink granulation within the wound bed. There is no necrotic tissue within the wound bed. 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 There has been improvement in size and appearance. Apligraf #2 was placed in standard fashion today. Unfortunately patient developed an abscess to the back of his right leg where the prosthesis hits. He is also an uncontrolled diabetic with a recent hemoglobin A1c of  10.3 1-week ago. It is not too surprising that he developed this abscess. I was able to drain all of the purulent fluid on exam. No elevated temperature in office. Vitals were stable. I will start him on doxycycline to take for the next week. I recommended he visit the ED if symptoms worsen. He may have to have this opened and drained even more if he does not improve. Procedures Wound #2R Pre-procedure diagnosis of Wound #2R is a Diabetic Wound/Ulcer of the Lower Extremity located on the Left Metatarsal head first. A skin graft procedure using a bioengineered skin substitute/cellular or tissue based product was performed by Kalman Shan, DO with the following instrument(s): Blade and Forceps. Apligraf was applied and secured with Steri-Strips. 11 sq cm of product was utilized and 33 sq cm was wasted due to wound siz. Post Application, adapticc was applied. A Time Out was conducted at 11:30, prior to the start of the procedure. The procedure was tolerated well with a pain level of 0 throughout and a pain level of 0 following the procedure. Post procedure Diagnosis Wound #2R: Same as Pre-Procedure . Plan Follow-up Appointments: Return Appointment in 1 week. - Dr. Heber [place] Cellular or Tissue Based Products: Wound #2R Left Metatarsal head first: Cellular or Tissue Based Product Type: - Apligraf #2 Bathing/ Shower/ Hygiene: May shower and wash wound with soap and water. - WITH DRESSING CHANGES. Edema Control - Lymphedema / SCD / Other: Elevate legs to the level of the heart or above for 30 minutes daily and/or when sitting, a frequency of: Avoid standing for long periods of time. Laboratory ordered were: Anerobic culture - right post kne The following medication(s) was prescribed: doxycycline hyclate oral 100 mg tablet 1 1 tablet twice daily for 10 days starting 07/17/2021 WOUND #2R: - Metatarsal head first Wound Laterality: Left Cleanser: Soap and Water 1 x Per Day/15 Days Discharge  Instructions: May shower and wash wound with dial antibacterial soap and water prior to dressing change. Cleanser: Wound Cleanser (Generic) 1 x Per Day/15 Days Discharge Instructions: Cleanse the wound with wound cleanser prior to applying a clean dressing using gauze sponges, not tissue or cotton balls. Peri-Wound Care:  Sween Lotion (Moisturizing lotion) 1 x Per Day/15 Days Discharge Instructions: Apply moisturizing lotion as directed Secondary Dressing: Woven Gauze Sponges 2x2 in (Generic) 1 x Per WVP/71 Days Discharge Instructions: Apply over primary dressing as directed. Secondary Dressing: Optifoam Non-Adhesive Dressing, 4x4 in (Generic) 1 x Per Day/15 Days Discharge Instructions: Apply over primary dressing cut to make foam donut to help offload Secured With: Conforming Stretch Gauze Bandage, Sterile 2x75 (in/in) (Generic) 1 x Per Day/15 Days Discharge Instructions: Secure with stretch gauze as directed. Secured With: 55M Medipore H Soft Cloth Surgical T ape, 2x2 (in/yd) (Generic) 1 x Per Day/15 Days Discharge Instructions: Secure dressing with tape as directed. WOUND #3R: - Knee Wound Laterality: Right, Posterior Topical: Triple Antibiotic Ointment, 1 (oz) Tube 2 x Per Day/15 Days Secondary Dressing: Woven Gauze Sponge, Non-Sterile 4x4 in 2 x Per Day/15 Days Discharge Instructions: Apply over primary dressing as directed. Secured With: [employer], 4.5x3.1 (in/yd) 2 x Per Day/15 Days Discharge Instructions: Secure with Kerlix as directed. Secured With: 55M Medipore H Soft Cloth Surgical T 4 x 2 (in/yd) 2 x Per Day/15 Days ape Discharge Instructions: Secure dressing with tape as directed. 1. Apligraf #2 was placed in office today 2. Start doxycycline 3. Follow-up in 1 week 4. Visit the ED if symptoms worsen to the right posterior knee Electronic Signature(s) Signed: 07/17/2021 11:53:38 AM By: Kalman Shan DO Entered By: Kalman Shan on 07/17/2021  11:51:28 -------------------------------------------------------------------------------- HxROS Details Patient Name: Date of Service: [name], [name] 07/17/2021 11:00 A M Medical Record Number: [id number] Patient Account Number: [id number] Date of Birth/Sex: Treating RN: 09-16-1971 (50 y.o. [name] [name] Primary Care Provider: [name] Other Clinician: Referring Provider: Treating Provider/Extender: Delano Metz in Treatment: 52 Information Obtained From Patient Constitutional Symptoms (General Health) Medical History: Past Medical History Notes: falls , leukocytosis Eyes Medical History: Negative for: Cataracts; Glaucoma; Optic Neuritis Ear/Nose/Mouth/Throat Medical History: Negative for: Chronic sinus problems/congestion; Middle ear problems Hematologic/Lymphatic Medical History: Positive for: Anemia Negative for: Hemophilia; Human Immunodeficiency Virus; Lymphedema; Sickle Cell Disease Respiratory Medical History: Negative for: Aspiration; Asthma; Chronic Obstructive Pulmonary Disease (COPD); Pneumothorax; Sleep Apnea; Tuberculosis Past Medical History Notes: During waking hours and catches himself not breathing, "gasp for air". Saw Dr Wynonia Lawman, he couldn't find a reason Cardiovascular Medical History: Positive for: Hypertension; Peripheral Venous Disease Negative for: Angina; Arrhythmia; Congestive Heart Failure; Coronary Artery Disease; Deep Vein Thrombosis; Hypotension; Myocardial Infarction; Peripheral Arterial Disease; Phlebitis; Vasculitis Gastrointestinal Medical History: Negative for: Cirrhosis ; Colitis; Crohns; Hepatitis A; Hepatitis B; Hepatitis C Endocrine Medical History: Positive for: Type II Diabetes - oral meds Negative for: Type I Diabetes Time with diabetes: 2014 Treated with: Oral agents Blood sugar tested every day: Yes Tested : Blood sugar testing results: Breakfast: 91 Genitourinary Medical  History: Negative for: End Stage Renal Disease Immunological Medical History: Negative for: Lupus Erythematosus; Raynauds; Scleroderma Integumentary (Skin) Medical History: Negative for: History of Burn Musculoskeletal Medical History: Negative for: Gout; Rheumatoid Arthritis; Osteoarthritis; Osteomyelitis Neurologic Medical History: Negative for: Dementia; Neuropathy; Quadriplegia; Paraplegia; Seizure Disorder Oncologic Medical History: Negative for: Received Chemotherapy; Received Radiation Psychiatric Medical History: Negative for: Anorexia/bulimia; Confinement Anxiety Immunizations Pneumococcal Vaccine: Received Pneumococcal Vaccination: No Implantable Devices No devices added Hospitalization / Surgery History Type of Hospitalization/Surgery fever 105, sepsis cellulitis right popliteal fossa 04/09/2021 Family and Social History Cancer: No; Diabetes: Yes - Maternal Grandparents,Mother; Heart Disease: Yes - Father; Hereditary Spherocytosis: No; Hypertension: Yes - Father; Kidney Disease: No; Lung Disease: No; Seizures: No;  Stroke: No; Thyroid Problems: No; Tuberculosis: No; Current every day smoker; Marital Status - Single; Alcohol Use: Never; Drug Use: No History; Caffeine Use: Never; Financial Concerns: No; Food, Clothing or Shelter Needs: No; Support System Lacking: No; Transportation Concerns: No Electronic Signature(s) Signed: 07/17/2021 11:53:38 AM By: Kalman Shan DO Signed: 07/22/2021 5:24:19 PM By: Rhae Hammock RN Entered By: Kalman Shan on 07/17/2021 11:41:25 -------------------------------------------------------------------------------- SuperBill Details Patient Name: Date of Service: [name] [name] ND, [name] 07/17/2021 Medical Record Number: [id number] Patient Account Number: [id number] Date of Birth/Sex: Treating RN: 26-Jul-1971 (50 y.o. [name] [name], [name] [name] Primary Care Provider: [name] Other Clinician: Referring Provider: Treating  Provider/Extender: Delano Metz in Treatment: 66 Diagnosis Coding ICD-10 Codes Code Description E11.621 Type 2 diabetes mellitus with foot ulcer S81.801D Unspecified open wound, right lower leg, subsequent encounter L97.522 Non-pressure chronic ulcer of other part of left foot with fat layer exposed E11.42 Type 2 diabetes mellitus with diabetic polyneuropathy Z89.511 Acquired absence of right leg below knee Facility Procedures CPT4 Code: 47092957 Description: (Facility Use Only) Apligraf 1 SQ CM Modifier: Quantity: 58 CPT4 Code: [id number] Description: 96438 - SKIN SUB GRAFT FACE/NK/HF/G ICD-10 Diagnosis Description E11.621 Type 2 diabetes mellitus with foot ulcer L97.522 Non-pressure chronic ulcer of other part of left foot with fat layer exposed Modifier: Quantity: 1 Physician Procedures : CPT4 Code Description Modifier [id number] - WC PHYS SKIN SUB GRAFT FACE/NK/HF/G ICD-10 Diagnosis Description E11.621 Type 2 diabetes mellitus with foot ulcer L97.522 Non-pressure chronic ulcer of other part of left foot with fat layer exposed Quantity: 1 Electronic Signature(s) Signed: 07/17/2021 1:00:58 PM By: Kalman Shan DO Signed: 07/22/2021 5:24:19 PM By: Rhae Hammock RN Previous Signature: 07/17/2021 11:53:38 AM Version By: Kalman Shan DO Entered By: Rhae Hammock on 07/17/2021 11:54:40

## 2021-07-22 NOTE — Progress Notes (Signed)
[name] [name], [name] [name] ([id number]) Visit Report for 07/17/2021 Arrival Information Details Patient Name: Date of Service: [name] [name] [name] 07/17/2021 11:00 A M Medical Record Number: [id number] Patient Account Number: [id number] Date of Birth/Sex: Treating RN: 12-10-1970 (50 y.o. [name], [name] Primary Care Jearldean Gutt: [name] Other Clinician: Referring Chaquetta Schlottman: Treating Sammie Denner/Extender: Delano Metz in Treatment: 26 Visit Information History Since Last Visit All ordered tests and consults were completed: No Patient Arrived: Ambulatory Added or deleted any medications: No Arrival Time: 10:57 Any new allergies or adverse reactions: No Accompanied By: Self Had a fall or experienced change in No Transfer Assistance: None activities of daily living that may affect Patient Identification Verified: Yes risk of falls: Secondary Verification Process Completed: Yes Signs or symptoms of abuse/neglect since last visito No Patient Requires Transmission-Based Precautions: No Hospitalized since last visit: No Patient Has Alerts: No Implantable device outside of the clinic excluding No cellular tissue based products placed in the center since last visit: Has Dressing in Place as Prescribed: Yes Pain Present Now: Yes Electronic Signature(s) Signed: 07/21/2021 3:13:54 PM By: Sandre Kitty Entered By: Sandre Kitty on 07/17/2021 11:00:46 -------------------------------------------------------------------------------- Lower Extremity Assessment Details Patient Name: Date of Service: [name] [name] [name] 07/17/2021 11:00 A M Medical Record Number: [id number] Patient Account Number: [id number] Date of Birth/Sex: Treating RN: [birth year] (50 y.o. [name], [name] Primary Care Boleslaw Borghi: [name] Other Clinician: Referring Zaria Taha: Treating Tashanti Dalporto/Extender: Delano Metz in Treatment: 46 Edema  Assessment Assessed: [name]: Yes] [name]: No] Edema: [Left: Ye] [Right: s] Calf Left: Right: Point of Measurement: 50 cm From Medial Instep 43 cm Ankle Left: Right: Point of Measurement: 11 cm From Medial Instep 28.5 cm Vascular Assessment Pulses: Dorsalis Pedis Palpable: [Left:Yes] Posterior Tibial Palpable: [Left:Yes] Electronic Signature(s) Signed: 07/22/2021 5:24:19 PM By: Rhae Hammock RN Entered By: Rhae Hammock on 07/17/2021 11:13:58 -------------------------------------------------------------------------------- Multi Wound Chart Details Patient Name: Date of Service: [name] [name], [place] 07/17/2021 11:00 A M Medical Record Number: [id number] Patient Account Number: [id number] Date of Birth/Sex: Treating RN: 1970/12/15 (50 y.o. 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 N/A Primary Etiology: Extremity Anemia, Hypertension, Peripheral Anemia, Hypertension, Peripheral N/A Comorbid History: Venous Disease, Type II Diabetes Venous Disease, Type II Diabetes 06/23/2020 03/31/2021 N/A Date Acquired: 23 14 N/A Weeks of Treatment: Open Open N/A Wound Status: Yes Yes N/A Wound Recurrence: No Yes N/A Clustered Wound: 0.4x0.2x0.2 0.2x0.2x1.4 N/A Measurements L x W x D (cm) 0.063 0.031 N/A A (cm) : rea 0.013 0.044 N/A Volume (cm) : -103.20% 98.10% N/A % Reduction in A rea: -333.30% 73.00% N/A % Reduction in  Volume: 6 Position 1 (o'clock): 2 Maximum Distance 1 (cm): 12 12 Starting Position 1 (o'clock): 12 12 Ending Position 1 (o'clock): 0.2 1 Maximum Distance 1 (cm): No Yes N/A Tunneling: Yes Yes N/A Undermining: Grade 2 Category/Stage II N/A Classification: Small Medium N/A Exudate A mount: Serosanguineous Purulent N/A Exudate Type: red, brown yellow, brown, green N/A Exudate Color: Thickened Flat and Intact N/A Wound Margin: Large (67-100%) Large (67-100%) N/A Granulation A mount: Red Pink N/A Granulation Quality: None Present (  0%) None Present (0%) N/A Necrotic A mount: Fat Layer (Subcutaneous Tissue): Yes Fat Layer (Subcutaneous Tissue): Yes N/A Exposed Structures: Fascia: No Fascia: No Tendon: No Tendon: No Muscle: No Muscle: No Joint: No Joint: No Bone: No Bone: No None None N/A Epithelialization: Cellular or Tissue Based Product N/A N/A Procedures Performed: Treatment Notes Electronic Signature(s) Signed: 07/17/2021 11:53:38 AM By: Kalman Shan DO Signed: 07/22/2021 5:24:19 PM By: Rhae Hammock RN Entered By: Kalman Shan on 07/17/2021 11:35:58 -------------------------------------------------------------------------------- Multi-Disciplinary Care Plan Details Patient Name: Date of Service: [name] [name], [name] 07/17/2021 11:00 A M Medical Record Number: [id number] Patient Account Number: [id number] Date of Birth/Sex: Treating RN: September 14, 1971 (50 y.o. [name], [name] Primary Care Laporche Martelle: [name] Other Clinician: Referring Harish Bram: Treating Ieesha Abbasi/Extender: Delano Metz in Treatment: [place] reviewed with physician Active Inactive Wound/Skin Impairment Nursing Diagnoses: Impaired tissue integrity Goals: Patient/caregiver will verbalize understanding of skin care regimen Date Initiated: 11/19/2020 Target Resolution Date: 06/13/2021 Goal Status: Active Ulcer/skin  breakdown will have a volume reduction of 30% by week 4 Date Initiated: 08/16/2020 Date Inactivated: 03/03/2021 Target Resolution Date: 03/10/2021 Goal Status: Met Interventions: Provide education on ulcer and skin care Notes: Electronic Signature(s) Signed: 07/22/2021 5:24:19 PM By: Rhae Hammock RN Entered By: Rhae Hammock on 07/17/2021 11:50:13 -------------------------------------------------------------------------------- Pain Assessment Details Patient Name: Date of Service: [name] [name], [name] 07/17/2021 11:00 A M Medical Record Number: [id number] Patient Account Number: [id number] Date of Birth/Sex: Treating RN: 07-Aug-1971 (50 y.o. [name], [name] Primary Care Artemus Romanoff: [name] Other Clinician: Referring Umeka Wrench: Treating Bernadetta Roell/Extender: Delano Metz in Treatment: 36 Active Problems Location of Pain Severity and Description of Pain Patient Has Paino Yes Site Locations Pain Location: Generalized Pain, Pain in Ulcers With Dressing Change: Yes Duration of the Pain. Constant / Intermittento Intermittent Rate the pain. Current Pain Level: 6 Worst Pain Level: 10 Least Pain Level: 0 Tolerable Pain Level: 6 Character of Pain Describe the Pain: Aching Pain Management and Medication Current Pain Management: Medication: No Cold Application: No Rest: No Massage: No Activity: No T.E.N.S.: No Heat Application: No Leg drop or elevation: No Is the Current Pain Management Adequate: Adequate How does your wound impact your activities of daily livingo Sleep: No Bathing: No Appetite: No Relationship With Others: No Bladder Continence: No Emotions: No Bowel Continence: No Work: No Toileting: No Drive: No Dressing: No Hobbies: No Electronic Signature(s) Signed: 07/22/2021 5:24:19 PM By: Rhae Hammock RN Entered By: Rhae Hammock on 07/17/2021  11:05:56 -------------------------------------------------------------------------------- Patient/Caregiver Education Details Patient Name: Date of Service: [name] [name], [name] 8/25/2022andnbsp11:00 A M Medical Record Number: [id number] Patient Account Number: [id number] Date of Birth/Gender: Treating RN: 10/04/1971 (50 y.o. [name] Primary Care Physician: [name] Other Clinician: Referring Physician: Treating Physician/Extender: Delano Metz in Treatment: 47 Education Assessment Education Provided To: Patient Education Topics Provided Wound/Skin Impairment: Methods: Explain/Verbal Responses: State content correctly [employer]) Signed: 07/22/2021 5:24:19 PM By: Rhae Hammock RN Entered By: Rhae Hammock on 07/17/2021 11:50:59 -------------------------------------------------------------------------------- Wound Assessment Details Patient Name: Date of Service: [name] [name], [name] 07/17/2021 11:00 A M Medical Record Number: [id number] Patient Account Number: [id number] Date of Birth/Sex: Treating RN: 04-03-1971 (50 y.o. [name] Primary Care Masahiro Iglesia: [name] Other Clinician: Referring Lyanna Blystone: Treating Sharline Lehane/Extender: Delano Metz in Treatment: 90 Wound Status Wound Number: [id number] Primary Diabetic Wound/Ulcer of the Lower Extremity Etiology: Wound Location: Left Metatarsal head first Wound Status: Open Wounding  Event: Blister Comorbid Anemia, Hypertension, Peripheral Venous Disease, Type II Date Acquired: 06/23/2020 History: Diabetes Weeks Of Treatment: 47 Clustered Wound: No Photos Wound Measurements Length: (cm) 0.4 Width: (cm) 0.2 Depth: (cm) 0.2 Area: (cm) 0.063 Volume: (cm) 0.013 % Reduction in Area: -103.2% % Reduction in Volume: -333.3% Epithelialization: None Tunneling: No Undermining: Yes Starting Position (o'clock): 12 Ending  Position (o'clock): 12 Maximum Distance: (cm) 0.2 Wound Description Classification: Grade 2 Wound Margin: Thickened Exudate Amount: Small Exudate Type: Serosanguineous Exudate Color: red, brown Foul Odor After Cleansing: No Slough/Fibrino No Wound Bed Granulation Amount: Large (67-100%) Exposed Structure Granulation Quality: Red Fascia Exposed: No Necrotic Amount: None Present (0%) Fat Layer (Subcutaneous Tissue) Exposed: Yes Tendon Exposed: No Muscle Exposed: No Joint Exposed: No Bone Exposed: No Electronic Signature(s) Signed: 07/21/2021 3:13:54 PM By: Sandre Kitty Signed: 07/22/2021 5:24:19 PM By: Rhae Hammock RN Signed: 07/22/2021 5:24:19 PM By: Rhae Hammock RN Entered By: Sandre Kitty on 07/17/2021 11:15:49 -------------------------------------------------------------------------------- Wound Assessment Details Patient Name: Date of Service: [name] [name], [name] 07/17/2021 11:00 A M Medical Record Number: [id number] Patient Account Number: [id number] Date of Birth/Sex: Treating RN: 12/25/1970 (50 y.o. [name], [name] Primary Care Brittannie Tawney: [name] Other Clinician: Referring Jestin Burbach: Treating Karisa Nesser/Extender: Delano Metz in Treatment: 21 Wound Status Wound Number: [id number] Primary Pressure Ulcer Etiology: Wound Location: Right, Posterior Knee Wound Status: Open Wounding Event: Pressure Injury Comorbid Anemia, Hypertension, Peripheral Venous Disease, Type II Date Acquired: 03/31/2021 History: Diabetes Weeks Of Treatment: 14 Clustered Wound: Yes Wound Measurements Length: (cm) 0.2 Width: (cm) 0.2 Depth: (cm) 1.4 Area: (cm) 0.031 Volume: (cm) 0.044 % Reduction in Area: 98.1% % Reduction in Volume: 73% Epithelialization: None Tunneling: Yes Position (o'clock): 6 Maximum Distance: (cm) 2 Undermining: Yes Starting Position (o'clock): 12 Ending Position (o'clock): 12 Maximum Distance: (cm) 1 Wound  Description Classification: Category/Stage II Wound Margin: Flat and Intact Exudate Amount: Medium Exudate Type: Purulent Exudate Color: yellow, brown, green Wound Bed Granulation Amount: Large (67-100%) Exposed Structure Granulation Quality: Pink Fascia Exposed: No Necrotic Amount: None Present (0%) Fat Layer (Subcutaneous Tissue) Exposed: Yes Tendon Exposed: No Muscle Exposed: No Joint Exposed: No Bone Exposed: No Electronic Signature(s) Signed: 07/22/2021 5:24:19 PM By: Rhae Hammock RN Entered By: Rhae Hammock on 07/17/2021 11:19:40 -------------------------------------------------------------------------------- Vitals Details Patient Name: Date of Service: [name] [name], [name] 07/17/2021 11:00 A M Medical Record Number: [id number] Patient Account Number: [id number] Date of Birth/Sex: Treating RN: [birth year] (50 y.o. [name], [name] Primary Care Kyrianna Barletta: [name] Other Clinician: Referring Cherika Jessie: Treating Phoebie Shad/Extender: Delano Metz in Treatment: 4 Vital Signs Time Taken: 11:04 Temperature (F): 98.2 Height (in): 77 Pulse (bpm): 90 Weight (lbs): 232 Respiratory Rate (breaths/min): 17 Body Mass Index (BMI): 27.5 Blood Pressure (mmHg): 155/98 Capillary Blood Glucose (mg/dl): 137 Reference Range: 80 - 120 mg / dl Electronic Signature(s) Signed: 07/21/2021 3:13:54 PM By: Sandre Kitty Entered By: Sandre Kitty on 07/17/2021 11:04:58

## 2021-07-23 ENCOUNTER — Other Ambulatory Visit: Payer: Self-pay

## 2021-07-23 ENCOUNTER — Encounter (HOSPITAL_COMMUNITY)
Admission: RE | Admit: 2021-07-23 | Discharge: 2021-07-23 | Disposition: A | Discharge status: Home / Self Care | Payer: Medicare Other | Source: Ambulatory Visit | Attending: Nephrology | Admitting: Nephrology

## 2021-07-23 VITALS — BP 109/76 | HR 99 | Temp 98.9°F | Resp 17

## 2021-07-23 DIAGNOSIS — N179 Acute kidney failure, unspecified: Secondary | ICD-10-CM

## 2021-07-23 DIAGNOSIS — D649 Anemia, unspecified: Secondary | ICD-10-CM

## 2021-07-23 DIAGNOSIS — D62 Acute posthemorrhagic anemia: Secondary | ICD-10-CM

## 2021-07-23 LAB — POCT HEMOGLOBIN-HEMACUE: Hemoglobin: 10.4 g/dL — ABNORMAL LOW (ref 13.0–17.0)

## 2021-07-23 MED ORDER — EPOETIN ALFA-EPBX 10000 UNIT/ML IJ SOLN
10000.0000 [IU] | INTRAMUSCULAR | Status: DC
  Administered 2021-07-23: 10000 [IU] via SUBCUTANEOUS

## 2021-07-23 MED ORDER — EPOETIN ALFA-EPBX 10000 UNIT/ML IJ SOLN
INTRAMUSCULAR | Status: AC
  Filled 2021-07-23: qty 1

## 2021-07-24 ENCOUNTER — Encounter (HOSPITAL_BASED_OUTPATIENT_CLINIC_OR_DEPARTMENT_OTHER): Payer: Medicare Other | Attending: Internal Medicine | Admitting: Internal Medicine

## 2021-07-24 DIAGNOSIS — F17200 Nicotine dependence, unspecified, uncomplicated: Secondary | ICD-10-CM | POA: Diagnosis not present

## 2021-07-24 DIAGNOSIS — Z89511 Acquired absence of right leg below knee: Secondary | ICD-10-CM | POA: Insufficient documentation

## 2021-07-24 DIAGNOSIS — Z833 Family history of diabetes mellitus: Secondary | ICD-10-CM | POA: Diagnosis not present

## 2021-07-24 DIAGNOSIS — E11621 Type 2 diabetes mellitus with foot ulcer: Secondary | ICD-10-CM | POA: Diagnosis not present

## 2021-07-24 DIAGNOSIS — E1151 Type 2 diabetes mellitus with diabetic peripheral angiopathy without gangrene: Secondary | ICD-10-CM | POA: Diagnosis not present

## 2021-07-24 DIAGNOSIS — E1142 Type 2 diabetes mellitus with diabetic polyneuropathy: Secondary | ICD-10-CM | POA: Diagnosis not present

## 2021-07-24 DIAGNOSIS — X58XXXD Exposure to other specified factors, subsequent encounter: Secondary | ICD-10-CM | POA: Diagnosis not present

## 2021-07-24 DIAGNOSIS — S81801D Unspecified open wound, right lower leg, subsequent encounter: Secondary | ICD-10-CM | POA: Diagnosis not present

## 2021-07-24 DIAGNOSIS — L97522 Non-pressure chronic ulcer of other part of left foot with fat layer exposed: Secondary | ICD-10-CM | POA: Diagnosis not present

## 2021-07-24 DIAGNOSIS — L97529 Non-pressure chronic ulcer of other part of left foot with unspecified severity: Secondary | ICD-10-CM | POA: Diagnosis present

## 2021-07-24 NOTE — Progress Notes (Signed)
ETIENNE, MOWERS ([id number]) Visit Report for 07/24/2021 Arrival Information Details Patient Name: Date of Service: [name] [name] 07/24/2021 10:45 A M Medical Record Number: [id number] Patient Account Number: [id number] Date of Birth/Sex: Treating RN: [birth year] (50 y.o. [name] [name] Primary Care [name] [name]: [name] [name] Other Clinician: Referring [name] [name]: Treating [name] [name]/Extender: [name] [name] in Treatment: 1 Visit Information History Since Last Visit Added or deleted any medications: No Patient Arrived: Ambulatory Any new allergies or adverse reactions: No Arrival Time: 10:48 Had a fall or experienced change in No Transfer Assistance: None activities of daily living that may affect Patient Identification Verified: Yes risk of falls: Secondary Verification Process Completed: Yes Signs or symptoms of abuse/neglect since last visito No Patient Requires Transmission-Based Precautions: No Hospitalized since last visit: No Patient Has Alerts: No Implantable device outside of the clinic excluding No cellular tissue based products placed in the center since last visit: Has Dressing in Place as Prescribed: Yes Pain Present Now: Yes Electronic Signature(s) Signed: 07/24/2021 4:37:51 PM By: Lorrin Jackson Entered By: Lorrin Jackson on 07/24/2021 10:53:19 -------------------------------------------------------------------------------- Encounter Discharge Information Details Patient Name: Date of Service: [name] [name], [name] 07/24/2021 10:45 A M Medical Record Number: [id number] Patient Account Number: [id number] Date of Birth/Sex: Treating RN: [name] [name] (50 y.o. [name] [name] Primary Care Serenna Deroy: [name] [name] Other Clinician: Referring Dabney Schanz: Treating [name] [name]/Extender: [name] [name] in Treatment: 31 Encounter Discharge Information Items Post Procedure Vitals Discharge Condition: Stable Temperature  (F): 97.9 Ambulatory Status: Ambulatory Pulse (bpm): 88 Discharge Destination: Home Respiratory Rate (breaths/min): 18 Transportation: Private Auto Blood Pressure (mmHg): 145/89 Schedule Follow-up Appointment: Yes Clinical Summary of Care: Provided on 07/24/2021 Form Type Recipient Paper Patient Patient Electronic Signature(s) Signed: 07/24/2021 4:37:51 PM By: Lorrin Jackson Entered By: Lorrin Jackson on 07/24/2021 11:53:59 -------------------------------------------------------------------------------- Lower Extremity Assessment Details Patient Name: Date of Service: [name] [name], [name] 07/24/2021 10:45 A M Medical Record Number: [id number] Patient Account Number: [id number] Date of Birth/Sex: Treating RN: [name] (50 y.o. [name] [name] Primary Care Kamaryn Grimley: [name] [name] Other Clinician: Referring Tabetha Haraway: Treating Jaber Dunlow/Extender: [name] [name] in Treatment: 48 Edema Assessment Assessed: [name]: Yes] [name]: No] Edema: [Left: Ye] [Right: s] Calf Left: Right: Point of Measurement: 50 cm From Medial Instep 38.4 cm Ankle Left: Right: Point of Measurement: 11 cm From Medial Instep 26.5 cm Vascular Assessment Pulses: Dorsalis Pedis Palpable: [Left:Yes Yes] Electronic Signature(s) Signed: 07/24/2021 4:37:51 PM By: Lorrin Jackson Entered By: Lorrin Jackson on 07/24/2021 11:00:37 -------------------------------------------------------------------------------- Multi Wound Chart Details Patient Name: Date of Service: [name] [name], [name] 07/24/2021 10:45 A M Medical Record Number: [id number] Patient Account Number: [id number] Date of Birth/Sex: Treating RN: [name] (50 y.o. [name], [name] [name] Primary Care Michaelle Bottomley: [name] [name] Other Clinician: Referring Alois Mincer: Treating Joeseph Verville/Extender: [name] [name] in Treatment: 48 Vital Signs Height(in): 77 Pulse(bpm): 88 Weight(lbs): 232 Blood  Pressure(mmHg): 145/89 Body Mass Index(BMI): 28 Temperature(F): 97.9 Respiratory Rate(breaths/min): 18 Photos: [2R:Left Metatarsal head first] [3R:Right, Posterior Knee] [N/A:N/A N/A] Wound Location: [2R:Blister] [3R:Pressure Injury] [N/A:N/A] Wounding Event: [2R:Diabetic Wound/Ulcer of the Lower] [3R:Pressure Ulcer] [N/A:N/A] Primary Etiology: [2R:Extremity Anemia, Hypertension, Peripheral] [3R:Anemia, Hypertension, Peripheral] [N/A:N/A] Comorbid History: [2R:Venous Disease, Type II Diabetes 06/23/2020] [3R:Venous Disease, Type II Diabetes 03/31/2021] [N/A:N/A] Date Acquired: [2R:48] [3R:15] [N/A:N/A] Weeks of Treatment: [2R:Open] [3R:Open] [N/A:N/A] Wound Status: [2R:Yes] [3R:Yes] [N/A:N/A] Wound Recurrence: [2R:No] [3R:Yes] [N/A:N/A] Clustered Wound: [2R:0.3x0.2x0.2] [3R:0.2x0.2x0.5] [N/A:N/A] Measurements L x W x D (cm) [2R:0.047] [3R:0.031] [N/A:N/A] A (cm) : rea [2R:0.009] [  3R:0.016] [N/A:N/A] Volume (cm) : [2R:-51.60%] [3R:98.10%] [N/A:N/A] % Reduction in A rea: [2R:-200.00%] [3R:90.20%] [N/A:N/A] % Reduction in Volume: [2R:Grade 2] [3R:Category/Stage II] [N/A:N/A] Classification: [2R:Small] [3R:Medium] [N/A:N/A] Exudate A mount: [2R:Serosanguineous] [3R:Purulent] [N/A:N/A] Exudate Type: [2R:red, brown] [3R:yellow, brown, green] [N/A:N/A] Exudate Color: [2R:Well defined, not attached] [3R:Well defined, not attached] [N/A:N/A] Wound Margin: [2R:Large (67-100%)] [3R:Large (67-100%)] [N/A:N/A] Granulation A mount: [2R:Red, Pink] [3R:Red, Pink] [N/A:N/A] Granulation Quality: [2R:None Present (0%)] [3R:None Present (0%)] [N/A:N/A] Necrotic A mount: [2R:Fat Layer (Subcutaneous Tissue): Yes Fat Layer (Subcutaneous Tissue): Yes N/A] Exposed Structures: [2R:Fascia: No Tendon: No Muscle: No Joint: No Bone: No None] [3R:Fascia: No Tendon: No Muscle: No Joint: No Bone: No Medium (34-66%)] [N/A:N/A] Epithelialization: [2R:Cellular or Tissue Based Product] [3R:N/A] [N/A:N/A] Treatment  Notes Electronic Signature(s) Signed: 07/24/2021 11:37:29 AM By: Kalman Shan DO Signed: 07/24/2021 4:23:50 PM By: Rhae Hammock RN Entered By: Kalman Shan on 07/24/2021 11:28:29 -------------------------------------------------------------------------------- Multi-Disciplinary Care Plan Details Patient Name: Date of Service: [name] [name], [name] 07/24/2021 10:45 A M Medical Record Number: [id number] Patient Account Number: [id number] Date of Birth/Sex: Treating RN: [name] (50 y.o. [name] [name] Primary Care Caitlan Chauca: [name] [name] Other Clinician: Referring Sanaya Gwilliam: Treating Zael Shuman/Extender: [name] [name] in Treatment: 25 Multidisciplinary Care Plan reviewed with physician Active Inactive Wound/Skin Impairment Nursing Diagnoses: Impaired tissue integrity Goals: Patient/caregiver will verbalize understanding of skin care regimen Date Initiated: 11/19/2020 Target Resolution Date: 08/21/2021 Goal Status: Active Ulcer/skin breakdown will have a volume reduction of 30% by week 4 Date Initiated: 08/16/2020 Date Inactivated: 03/03/2021 Target Resolution Date: 03/10/2021 Goal Status: Met Interventions: Provide education on ulcer and skin care Notes: 07/24/21: Wound care regimen ongoing. Electronic Signature(s) Signed: 07/24/2021 4:37:51 PM By: Lorrin Jackson Entered By: Lorrin Jackson on 07/24/2021 11:08:32 -------------------------------------------------------------------------------- Pain Assessment Details Patient Name: Date of Service: [name] [name], [name] 07/24/2021 10:45 A M Medical Record Number: [id number] Patient Account Number: [id number] Date of Birth/Sex: Treating RN: [name] (50 y.o. [name] [name] Primary Care Jeromiah Ohalloran: [name] [name] Other Clinician: Referring Matalyn Nawaz: Treating Joniqua Sidle/Extender: [name] [name] in Treatment: 57 Active Problems Location of Pain Severity and Description  of Pain Patient Has Paino Yes Site Locations Pain Location: Pain in Ulcers With Dressing Change: Yes Duration of the Pain. Constant / Intermittento Intermittent Rate the pain. Current Pain Level: 6 Character of Pain Describe the Pain: Tender, Throbbing Pain Management and Medication Current Pain Management: Medication: Yes Cold Application: No Rest: Yes Massage: No Activity: No T.E.N.S.: No Heat Application: No Leg drop or elevation: No Is the Current Pain Management Adequate: Inadequate How does your wound impact your activities of daily livingo Sleep: Yes Bathing: No Appetite: No Relationship With Others: No Bladder Continence: No Emotions: No Bowel Continence: No Work: No Toileting: No Drive: No Dressing: No Hobbies: No Electronic Signature(s) Signed: 07/24/2021 4:37:51 PM By: Lorrin Jackson Entered By: Lorrin Jackson on 07/24/2021 10:54:36 -------------------------------------------------------------------------------- Patient/Caregiver Education Details Patient Name: Date of Service: [name] [name] 9/1/2022andnbsp10:45 A M Medical Record Number: [id number] Patient Account Number: [id number] Date of Birth/Gender: Treating RN: [birth year] (50 y.o. [name] [name] Primary Care Physician: [name] [name] Other Clinician: Referring Physician: Treating Physician/Extender: [name] [name] in Treatment: 67 Education Assessment Education Provided To: Patient Education Topics Provided Pressure: Methods: Explain/Verbal, Printed Responses: State content correctly Wound/Skin Impairment: Methods: Explain/Verbal, Printed Responses: State content correctly Electronic Signature(s) Signed: 07/24/2021 4:37:51 PM By: Lorrin Jackson Entered By: Lorrin Jackson on 07/24/2021 11:08:57 -------------------------------------------------------------------------------- Wound Assessment Details Patient Name: Date of Service:  [place] [name], [name] MIE  L. 07/24/2021 10:45 A M Medical Record Number: [id number] Patient Account Number: [id number] Date of Birth/Sex: Treating RN: [name] (50 y.o. 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 Etiology: Wound Location: Left Metatarsal head first Wound Status: Open Wounding Event: Blister Comorbid Anemia, Hypertension, Peripheral Venous Disease, Type II Date Acquired: 06/23/2020 History: Diabetes Weeks Of Treatment: 48 Clustered Wound: No Photos Wound Measurements Length: (cm) 0.3 Width: (cm) 0.2 Depth: (cm) 0.2 Area: (cm) 0.047 Volume: (cm) 0.009 % Reduction in Area: -51.6% % Reduction in Volume: -200% Epithelialization: None Tunneling: No Undermining: No Wound Description Classification: Grade 2 Wound Margin: Well defined, not attached Exudate Amount: Small Exudate Type: Serosanguineous Exudate Color: red, brown Foul Odor After Cleansing: No Slough/Fibrino No Wound Bed Granulation Amount: Large (67-100%) Exposed Structure Granulation Quality: Red, Pink Fascia Exposed: No Necrotic Amount: None Present (0%) Fat Layer (Subcutaneous Tissue) Exposed: Yes Tendon Exposed: No Muscle Exposed: No Joint Exposed: No Bone Exposed: No Treatment Notes Wound #2R (Metatarsal head first) Wound Laterality: Left Cleanser Soap and Water Discharge Instruction: May shower and wash wound with dial antibacterial soap and water prior to dressing change. Wound Cleanser Discharge Instruction: Cleanse the wound with wound cleanser prior to applying a clean dressing using gauze sponges, not tissue or cotton balls. Peri-Wound Care Sween Lotion (Moisturizing lotion) Discharge Instruction: Apply moisturizing lotion as directed Topical Primary Dressing Secondary Dressing Woven  Gauze Sponges 2x2 in Discharge Instruction: Apply over primary dressing as directed. Optifoam Non-Adhesive Dressing, 4x4 in Discharge Instruction: Apply over primary dressing cut to make foam donut to help offload ADAPTIC TOUCH 3x4.25 in Discharge Instruction: Apply over skin sub Secured With Conforming Stretch Gauze Bandage, Sterile 2x75 (in/in) Discharge Instruction: Secure with stretch gauze as directed. 68M Medipore H Soft Cloth Surgical T ape, 2x2 (in/yd) Discharge Instruction: Secure dressing with tape as directed. Compression Wrap Compression Stockings Add-Ons Electronic Signature(s) Signed: 07/24/2021 4:37:51 PM By: Lorrin Jackson Entered By: Lorrin Jackson on 07/24/2021 11:02:34 -------------------------------------------------------------------------------- Wound Assessment Details Patient Name: Date of Service: [name] [name], [name] 07/24/2021 10:45 A M Medical Record Number: [id number] Patient Account Number: [id number] Date of Birth/Sex: Treating RN: [name] (50 y.o. [name] [name] Primary Care Vaibhav Fogleman: [name] [name] Other Clinician: Referring Aymen Widrig: Treating Videl Nobrega/Extender: [name] [name] in Treatment: 56 Wound Status Wound Number: 3R Primary Pressure Ulcer Etiology: Wound Location: Right, Posterior Knee Wound Status: Open Wounding Event: Pressure Injury Comorbid Anemia, Hypertension, Peripheral Venous Disease, Type II Date Acquired: 03/31/2021 History: Diabetes Weeks Of Treatment: 15 Clustered Wound: Yes Photos Wound Measurements Length: (cm) 0.2 Width: (cm) 0.2 Depth: (cm) 0.5 Area: (cm) 0.031 Volume: (cm) 0.016 % Reduction in Area: 98.1% % Reduction in Volume: 90.2% Epithelialization: Medium (34-66%) Tunneling: No Undermining: No Wound Description Classification: Category/Stage II Wound Margin: Well defined, not attached Exudate Amount: Medium Exudate Type: Purulent Exudate Color: yellow, brown, green Wound  Bed Granulation Amount: Large (67-100%) Exposed Structure Granulation Quality: Red, Pink Fascia Exposed: No Necrotic Amount: None Present (0%) Fat Layer (Subcutaneous Tissue) Exposed: Yes Tendon Exposed: No Muscle Exposed: No Joint Exposed: No Bone Exposed: No Treatment Notes Wound #3R (Knee) Wound Laterality: Right, Posterior Cleanser Soap and Water Discharge Instruction: May shower and wash wound with dial antibacterial soap and water prior to dressing change. Wound Cleanser Discharge Instruction: Cleanse the wound with wound cleanser prior to  applying a clean dressing using gauze sponges, not tissue or cotton balls. Peri-Wound Care Topical Triple Antibiotic Ointment, 1 (oz) Tube Primary Dressing Secondary Dressing Woven Gauze Sponge, Non-Sterile 4x4 in Discharge Instruction: Apply over primary dressing as directed. Secured With [employer], 4.5x3.1 (in/yd) Discharge Instruction: Secure with Kerlix as directed. 86M Medipore H Soft Cloth Surgical T 4 x 2 (in/yd) ape Discharge Instruction: Secure dressing with tape as directed. Compression Wrap Compression Stockings Add-Ons Electronic Signature(s) Signed: 07/24/2021 4:37:51 PM By: Lorrin Jackson Entered By: Lorrin Jackson on 07/24/2021 11:18:01 -------------------------------------------------------------------------------- Vitals Details Patient Name: Date of Service: [name] [name], [name] 07/24/2021 10:45 A M Medical Record Number: [id number] Patient Account Number: [id number] Date of Birth/Sex: Treating RN: [name] (50 y.o. [name] [name] Primary Care Jontae Sonier: [name] [name] Other Clinician: Referring Dagoberto Nealy: Treating Donia Yokum/Extender: [name] [name] in Treatment: 48 Vital Signs Time Taken: 10:53 Temperature (F): 97.9 Height (in): 77 Pulse (bpm): 88 Weight (lbs): 232 Respiratory Rate (breaths/min): 18 Body Mass Index (BMI): 27.5 Blood Pressure (mmHg):  145/89 Reference Range: 80 - 120 mg / dl Notes Patient states has not checked blood sugar today Electronic Signature(s) Signed: 07/24/2021 4:37:51 PM By: Lorrin Jackson Entered By: Lorrin Jackson on 07/24/2021 10:54:01

## 2021-07-24 NOTE — Progress Notes (Signed)
EMEKA, LINDNER ([id number]) Visit Report for 07/24/2021 Chief Complaint Document Details Patient Name: Date of Service: [name] 07/24/2021 10:45 A M Medical Record Number: [id number] Patient Account Number: [id number] Date of Birth/Sex: Treating RN: Apr 02, 1971 (50 y.o. [name], [name] Primary Care Provider: [name] Other Clinician: Referring Provider: Treating Provider/Extender: Delano Metz in Treatment: 73 Information Obtained from: Patient Chief Complaint patient is here for a review of the wound on his plantar left first metatarsal head and back of his right leg Electronic Signature(s) Signed: 07/24/2021 11:37:29 AM By: Kalman Shan DO Entered By: Kalman Shan on 07/24/2021 11:28:48 -------------------------------------------------------------------------------- Cellular or Tissue Based Product Details Patient Name: Date of Service: [name], [name] 07/24/2021 10:45 A M Medical Record Number: [id number] Patient Account Number: [id number] Date of Birth/Sex: Treating RN: June 02, 1971 (50 y.o. [name] Primary Care Provider: [name] Other Clinician: Referring Provider: Treating Provider/Extender: Delano Metz in Treatment: 45 Cellular or Tissue Based Product Type Wound #2R Left Metatarsal head first Applied to: Performed By: Physician Kalman Shan, DO Cellular or Tissue Based Product Type: Apligraf Level of Consciousness (Pre-procedure): Awake and Alert Pre-procedure Verification/Time Out Yes - 11:18 Taken: Location: genitalia / hands / feet / multiple digits Wound Size (sq cm): 0.06 Product Size (sq cm): 44 Waste Size (sq cm): 34 Waste Reason: Wound size, product size Amount of Product Applied (sq cm): 10 Instrument Used: Blade, Forceps, Scissors Lot #: [id number] Expiration Date: 07/26/2021 Fenestrated: Yes Instrument: Blade Reconstituted: Yes Solution  Type: NS Solution Amount: 63m Lot #: [id number] Expiration Date: 07/24/2022 Secured: Yes Secured With: Steri-Strips Dressing Applied: Yes Primary Dressing: Adaptic Response to Treatment: Procedure was tolerated well Level of Consciousness (Post- Awake and Alert procedure): Post Procedure Diagnosis Same as Pre-procedure Electronic Signature(s) Signed: 07/24/2021 11:37:29 AM By: HKalman ShanDO Signed: 07/24/2021 4:37:51 PM By: BLorrin JacksonEntered By: BLorrin Jacksonon 07/24/2021 11:25:30 -------------------------------------------------------------------------------- HPI Details Patient Name: Date of Service: [name], [name] 07/24/2021 10:45 A M Medical Record Number: [id number] Account Number: [id number] of Birth/Sex: Treating RN: 109-01-1971(50y.o. MErie NoePrimary Care Provider: BFerd HibbsOther Clinician: Referring Provider: Treating Provider/Extender: HDelano Metzin Treatment: 419History of Present Illness HPI Description: 04/21/18 ADMISSION This is a 50year old man who is a type II diabetic. In spite of fact his hemoglobin A1c is actually quite good 5.73 months ago he is at a really difficult time over the last 6-7 months. He developed a rapidly progressive infection in the right foot in November 2018 associated with osteomyelitis and necrotizing fasciitis. 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 The stump required and a revision on 11/24/17. The stump revision was advertised as being secondary to falls. I'm not sure the progressive history here however this area is actually closed over. The patient tells me over the same timeframe he has had wounds on the plantar aspect of his left foot. In the ED saw Dr. DSharol Givenin April of this year. 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 It is really not clear that this patient is been dressing this with anything. He came in  with the clinic without any specific dressing on the wound areas using his own tennis shoe. He tells me that he only ambulates of course to do a pivot transfer. He does not have his prosthesis for the right leg as of yet  and he blames Medicaid for this. He does however use the foot to push himself along in his wheelchair at home. The patient has not had formal arterial studies. ABI in our clinic on the left was 1.13. Patient's past medical history includes type 2 diabetes, fracture of the right fibula. I see that he was treated for abscesses on his right buttock and chest in 2016. I did not look at the microbiology of this. 04/28/18; patient comes back in the clinic today with the wound pretty much the same as when he came in here last week. Small opening lots of undermining relatively. He tells [place] that nothing is really been on this for 3 days in spite of the fact that we gave him enough to dress this easily especially such a small wound. He says he lives with his mother, she is not capable of assisting with this he is changing the dressings himself. 05/05/18; much better-looking wound today. Smaller. There is some undermining medially however that's only perhaps 2 mm. No surrounding erythema. We've been using silver collagen 05/12/18; small wound on the first metatarsal head. No undermining no surrounding erythema. We've been using silver collagen he has home health coming out to change 05/20/18 the wound on the first metatarsal head looks better. Covered in surface debris/callus nonviable tissue. Required debridement but post debridement this looks quite good. We've been using silver collagen. He has home health 05/27/18; first metatarsal head wound continues to improve. Just about completely closed. Still a lot of surface debridement callus. We've been using silver collagen 06/06/18; the first metatarsal wound is completely healed over. There is still a lot of callus. I gently removed some of this just  to make sure that there was no open area and there is not. The patient mentions to me that his left leg has felt like "lead" for about a week READMISSION 08/16/2020 This is a 50 year old man with type 2 diabetes. He returns to clinic today with a 1 month history of a blister and callus over the first metatarsal head. This is in exactly the same area as when he was here in 2019. 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 He has standard running shoes on the left foot to match the area in his prosthesis. He has only been covering this area with a Band-Aid has not really been specifically dressing this. ABI in our clinic on the left was 1.16. This is essentially stable from the value in 2019 10/1 the patient has a linear wound over the left first metatarsal head. Again not a lot of callus thick skin around this that I removed with a #3 curette. This is not go deep to bone or muscle it does not look to be infected. 10/8 first metatarsal head. The wound looks like it is closing however this is going to be a difficult area to offload in the future. He has a size 15 shoe and he says his shoes are years old. The inserts in these current shoes are totally useless and I have advised him to replace these. He may need a new pair of shoes and I have he got original prosthesis at [facility] [place] I have asked him to go back there. 11/9; the patient has not been here in more than a month. Not sure he was healed when he was here the last time. I told him he would have to get new shoes and  certainly offload the area over the left first metatarsal head. He has done nothing of the kind. He arrives back in clinic with the same old new balance sneakers. A very large separating callus over the first metatarsal head on the left 11/16; he has purchased new shoes and has at least 2 insoles like I asked. The wound is measuring much smaller. He is using silver alginate he  changes the dressing himself 11/30; wound is measurably smaller in length of about a half a centimeter. This is generally very little depth. We have been using silver alginate. He changes the dressing himself every second day. 12/14; wound is about the same size. Significant undermining medially relative to the size of the wound. We have been using silver alginate. There is not a way to offload this area as he walks with a prosthesis on the right leg 12/28; wound is about the same size. Again he has undermining medially. I am using polymen on this 12/02/2020; the wound looks smaller. Still a senescent edge. We have been using polymen 1/24; the wound is come down a few millimeters. Still a senescent edge. I have been using polymen 2/8; the wound is come down slightly. Still with slight undermining from 12-6 o'clock I have been using polymen partially to get offloading on this area which is opposed by his prosthesis on the other leg. 2/22 quite open improvement he still has a comma shaped wound on the left first metatarsal head. Some depth. Using polymen 3/14; he still has the same problem of a comma shaped area over the left first metatarsal head. This seems to have had more depth today at 0.6 cm. We have been using polymen. The patient changes this himself every second day. I explored the idea of a total contact cast on the left leg however this is his driving foot. He was not enamored with the idea of not being able to drive and states that he does not have anybody else to get him around 3/28; again he comes in with a smaller looking wound however there is depth and undermining requiring debridement. We have been using Hydrofera Blue, changed to silver collagen today. The patient is doing the dressing himself 4/11; patient presents today for follow-up of his left diabetic foot wound. He denies any issues in the past 2 weeks. He is currently using silver collagen every other day with dressing  changes. He does not use diabetic shoes or special inserts to his sneakers. He does not use felt pads for offloading. 4/19; patient presents for his 1 week follow-up. He denies any issues. He reports using Hydrofera Blue and has not been using silver collagen for dressing changes. He reports minimal drainage to the wound. He overall feels well. 5/3; patient presents for 2-week follow-up. He has been using silver collagen without issues. He has no complaints today and states he overall feels well. 5/17; patient presents for 2-week follow-up. He states that the sleeve is rubbing up against the back of his right leg and causing a wound. He states this happened a week ago and has not been dressing the wound. The plantar wound is stable and he has been using collagen every other day. 5/24; Patient was had a recent hospitalization for cellulitis of his right popliteal fossa. He was admitted and started on IV antibiotics. He does not recall the plantar wound dressed while inpatient. He feels a lot better today. He was discharged on 5/21 on oral antibiotics and has not picked this  up until today. He reports using collagen to the plantar wound since discharge. He is keeping the right posterior knee wound covered. He is getting a new sleeve for his prostatic on 5/26. He currently denies signs of infection. 6/1; patient presents for 1 week follow-up. He is still on doxycycline. He has not been dressing the back of the right knee wound. He states he is receiving his new prosthetic sleeve tomorrow. He states he is using silver alginate to the plantar foot wound. He currently denies signs of infection. He states he has not been offloading the left foot wound 6/7; patient presents for 1 week follow-up. He completes his last dose of doxycycline today. He finally received his prosthetic sleeve for the right side and he states it feels much better. He has been using antibiotic ointment to the back of that right knee  wound. He has been using silver alginate to the plantar foot wound. He currently denies signs of infection. 6/21; patient presents for 2-week follow-up. He reports no issues with his prosthetic sleeve. He has been using antibiotic ointment to the back of the right knee. He has been using silver alginate to the plantar foot wound. He has no issues or complaints today. He denies signs of infection. 7/5; patient presents for 2-week follow-up. He reports no issues or complaints today. He continues to use antibiotic ointment to the right posterior knee wound and silver alginate to the left plantar foot wound. He denies signs of infection. 7/19; patient presents for 2-week follow-up. He continues to use antibiotic ointment to the right posterior knee and silver alginate to the left plantar foot wound. He has no issues or complaints today. He denies signs of infection. 8/4; patient presents for 2-week follow-up. He has no issues or complaints today. He denies signs of infection. 8/18; patient presents for 2-week follow-up. He has no issues or concerns today. He has been approved for Apligraf and he would like to have this placed today. He denies signs of infection and reports that his right posterior knee wound is closed. 8/25; patient presents for 1 week follow-up. He had no issues with his first Apligraf placement last week. He did however develop an abscess to the back of his right knee. He reports tenderness to this area. He denies systemic signs of infection. 9/1; patient presents for 1 week follow-up. He has had no issues with his plantar foot wound. The Apligraf has stayed in place over the past week. A wound culture was done at last clinic visit and a new antibiotic was sent to the pharmacy. Patient has not received this yet. He reports some mild tenderness to the back of his right knee. He denies systemic signs of infection. Electronic Signature(s) Signed: 07/24/2021 11:37:29 AM By: Kalman Shan  DO Entered By: Kalman Shan on 07/24/2021 11:29:43 -------------------------------------------------------------------------------- Physical Exam Details Patient Name: Date of Service: [name], [name] 07/24/2021 10:45 A M Medical Record Number: [id number] Patient Account Number: [id number] Date of Birth/Sex: Treating RN: 19-May-1971 (50 y.o. [name] Primary Care Provider: [name] Other Clinician: Referring Provider: Treating Provider/Extender: Delano Metz in Treatment: 48 Constitutional respirations regular, non-labored and within target range for patient.. Cardiovascular 2+ dorsalis pedis/posterior tibialis pulses. Psychiatric pleasant and cooperative. Notes Left first met head: Wound with granulation tissue present. No signs of infection. Posterior right knee: Pinpoint opening with scant purulent drainage on palpation. Tenderness to palpation. No increased warmth or erythema to the area. Electronic Signature(s) Signed: 07/24/2021  11:37:29 AM By: Kalman Shan DO Entered By: Kalman Shan on 07/24/2021 11:30:46 -------------------------------------------------------------------------------- Physician Orders Details Patient Name: Date of Service: [name], [name] 07/24/2021 10:45 A M Medical Record Number: [id number] Patient Account Number: [id number] Date of Birth/Sex: Treating RN: [birth year] (50 y.o. [name] Primary Care Provider: [name] Other Clinician: Referring Provider: Treating Provider/Extender: Delano Metz in Treatment: 43 Verbal / Phone Orders: No Diagnosis Coding ICD-10 Coding Code Description E11.621 Type 2 diabetes mellitus with foot ulcer S81.801D Unspecified open wound, right lower leg, subsequent encounter L97.522 Non-pressure chronic ulcer of other part of left foot with fat layer exposed E11.42 Type 2 diabetes mellitus with diabetic  polyneuropathy Z89.511 Acquired absence of right leg below knee Follow-up Appointments ppointment in 1 week. - Dr. Heber Sims Return A Other: - Prescription for Keflex sent to pharmacy, take as directed. Cellular or Tissue Based Products Wound #2R Left Metatarsal head first Cellular or Tissue Based Product Type: - Apligraf #2, 07/24/21 Apligraf #3 Edema Control - Lymphedema / SCD / Other Elevate legs to the level of the heart or above for 30 minutes daily and/or when sitting, a frequency of: Avoid standing for long periods of time. Wound Treatment Wound #2R - Metatarsal head first Wound Laterality: Left Cleanser: Soap and Water 1 x Per SEG/31 Days Discharge Instructions: May shower and wash wound with dial antibacterial soap and water prior to dressing change. Cleanser: Wound Cleanser (Generic) 1 x Per Day/15 Days Discharge Instructions: Cleanse the wound with wound cleanser prior to applying a clean dressing using gauze sponges, not tissue or cotton balls. Peri-Wound Care: Sween Lotion (Moisturizing lotion) 1 x Per Day/15 Days Discharge Instructions: Apply moisturizing lotion as directed Secondary Dressing: Woven Gauze Sponges 2x2 in (Generic) 1 x Per DVV/61 Days Discharge Instructions: Apply over primary dressing as directed. Secondary Dressing: Optifoam Non-Adhesive Dressing, 4x4 in (Generic) 1 x Per Day/15 Days Discharge Instructions: Apply over primary dressing cut to make foam donut to help offload Secondary Dressing: ADAPTIC TOUCH 3x4.25 in 1 x Per Day/15 Days Discharge Instructions: Apply over skin sub Secured With: Conforming Stretch Gauze Bandage, Sterile 2x75 (in/in) (Generic) 1 x Per Day/15 Days Discharge Instructions: Secure with stretch gauze as directed. Secured With: 451M Medipore H Soft Cloth Surgical Tape, 2x2 (in/yd) (Generic) 1 x Per Day/15 Days Discharge Instructions: Secure dressing with tape as directed. Wound #3R - Knee Wound Laterality: Right, Posterior Cleanser: Soap  and Water 2 x Per YWV/37 Days Discharge Instructions: May shower and wash wound with dial antibacterial soap and water prior to dressing change. Cleanser: Wound Cleanser 2 x Per Day/15 Days Discharge Instructions: Cleanse the wound with wound cleanser prior to applying a clean dressing using gauze sponges, not tissue or cotton balls. Topical: Triple Antibiotic Ointment, 1 (oz) Tube 2 x Per Day/15 Days Secondary Dressing: Woven Gauze Sponge, Non-Sterile 4x4 in 2 x Per Day/15 Days Discharge Instructions: Apply over primary dressing as directed. Secured With: [employer], 4.5x3.1 (in/yd) 2 x Per Day/15 Days Discharge Instructions: Secure with Kerlix as directed. Secured With: 451M Medipore H Soft Cloth Surgical T 4 x 2 (in/yd) 2 x Per Day/15 Days ape Discharge Instructions: Secure dressing with tape as directed. Electronic Signature(s) Signed: 07/24/2021 11:37:29 AM By: Kalman Shan DO Entered By: Kalman Shan on 07/24/2021 11:31:11 -------------------------------------------------------------------------------- Problem List Details Patient Name: Date of Service: [name], [name] 07/24/2021 10:45 A M Medical Record Number: [id number] Patient Account Number: [id number] Date of Birth/Sex: Treating RN: October 06, 1971 (  50 y.o. [name] Primary Care Provider: [name] Other Clinician: Referring Provider: Treating Provider/Extender: Delano Metz in Treatment: 21 Active Problems ICD-10 Encounter Code Description Active Date MDM Diagnosis [phone] Non-pressure chronic ulcer of other part of left foot with fat layer exposed 08/16/2020 No Yes E11.621 Type 2 diabetes mellitus with foot ulcer 08/16/2020 No Yes S81.801D Unspecified open wound, right lower leg, subsequent encounter 04/08/2021 No Yes E11.42 Type 2 diabetes mellitus with diabetic polyneuropathy 08/16/2020 No Yes Z89.511 Acquired absence of right leg below knee 08/16/2020 No  Yes Inactive Problems Resolved Problems Electronic Signature(s) Signed: 07/24/2021 11:37:29 AM By: Kalman Shan DO Entered By: Kalman Shan on 07/24/2021 11:28:05 -------------------------------------------------------------------------------- Progress Note Details Patient Name: Date of Service: [name], [name] 07/24/2021 10:45 A M Medical Record Number: [id number] Patient Account Number: [id number] Date of Birth/Sex: Treating RN: October 10, 1971 (50 y.o. [name], [name] Primary Care Provider: [name] Other Clinician: Referring Provider: Treating Provider/Extender: Delano Metz in Treatment: 21 Subjective Chief Complaint Information obtained from Patient patient is here for a review of the wound on his plantar left first metatarsal head and back of his right leg History of Present Illness (HPI) 04/21/18 ADMISSION This is a 50 year old man who is a type II diabetic. In spite of fact his hemoglobin A1c is actually quite good 5.73 months ago he is at a really difficult time over the last 6-7 months. He developed a rapidly progressive infection in the right foot in November 2018 associated with osteomyelitis and necrotizing fasciitis. 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 The stump required and a revision on 11/24/17. The stump revision was advertised as being secondary to falls. I'm not sure the progressive history here however this area is actually closed over. The patient tells me over the same timeframe he has had wounds on the plantar aspect of his left foot. In the ED saw Dr. Sharol Given in April of this year. 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 It is really not clear that this patient is been dressing this with anything. He came in with the clinic without any specific dressing on the wound areas using his own tennis shoe. He tells me that he only ambulates of course to do a pivot transfer. He does  not have his prosthesis for the right leg as of yet and he blames Medicaid for this. He does however use the foot to push himself along in his wheelchair at home. The patient has not had formal arterial studies. ABI in our clinic on the left was 1.13. Patient's past medical history includes type 2 diabetes, fracture of the right fibula. I see that he was treated for abscesses on his right buttock and chest in 2016. I did not look at the microbiology of this. 04/28/18; patient comes back in the clinic today with the wound pretty much the same as when he came in here last week. Small opening lots of undermining relatively. He tells [place] that nothing is really been on this for 3 days in spite of the fact that we gave him enough to dress this easily especially such a small wound. He says he lives with his mother, she is not capable of assisting with this he is changing the dressings himself. 05/05/18; much better-looking wound today. Smaller. There is some undermining medially however that's only perhaps 2 mm. No surrounding erythema. We've been using silver collagen 05/12/18; small wound  on the first metatarsal head. No undermining no surrounding erythema. We've been using silver collagen he has home health coming out to change 05/20/18 the wound on the first metatarsal head looks better. Covered in surface debris/callus nonviable tissue. Required debridement but post debridement this looks quite good. We've been using silver collagen. He has home health 05/27/18; first metatarsal head wound continues to improve. Just about completely closed. Still a lot of surface debridement callus. We've been using silver collagen 06/06/18; the first metatarsal wound is completely healed over. There is still a lot of callus. I gently removed some of this just to make sure that there was no open area and there is not. The patient mentions to me that his left leg has felt like "lead" for about a  week READMISSION 08/16/2020 This is a 50 year old man with type 2 diabetes. He returns to clinic today with a 1 month history of a blister and callus over the first metatarsal head. This is in exactly the same area as when he was here in 2019. 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 He has standard running shoes on the left foot to match the area in his prosthesis. He has only been covering this area with a Band-Aid has not really been specifically dressing this. ABI in our clinic on the left was 1.16. This is essentially stable from the value in 2019 10/1 the patient has a linear wound over the left first metatarsal head. Again not a lot of callus thick skin around this that I removed with a #3 curette. This is not go deep to bone or muscle it does not look to be infected. 10/8 first metatarsal head. The wound looks like it is closing however this is going to be a difficult area to offload in the future. He has a size 15 shoe and he says his shoes are years old. The inserts in these current shoes are totally useless and I have advised him to replace these. He may need a new pair of shoes and I have he got original prosthesis [facility] I have asked him to go back there. 11/9; the patient has not been here in more than a month. Not sure he was healed when he was here the last time. I told him he would have to get new shoes and certainly offload the area over the left first metatarsal head. He has done nothing of the kind. He arrives back in clinic with the same old new balance sneakers. A very large separating callus over the first metatarsal head on the left 11/16; he has purchased new shoes and has at least 2 insoles like I asked. The wound is measuring much smaller. He is using silver alginate he changes the dressing himself 11/30; wound is measurably smaller in length of about a half a centimeter. This is generally very little depth.  We have been using silver alginate. He changes the dressing himself every second day. 12/14; wound is about the same size. Significant undermining medially relative to the size of the wound. We have been using silver alginate. There is not a way to offload this area as he walks with a prosthesis on the right leg 12/28; wound is about the same size. Again he has undermining medially. I am using polymen on this 12/02/2020; the wound looks smaller. Still a senescent edge. We have been using polymen 1/24; the wound is come  down a few millimeters. Still a senescent edge. I have been using polymen 2/8; the wound is come down slightly. Still with slight undermining from 12-6 o'clock I have been using polymen partially to get offloading on this area which is opposed by his prosthesis on the other leg. 2/22 quite open improvement he still has a comma shaped wound on the left first metatarsal head. Some depth. Using polymen 3/14; he still has the same problem of a comma shaped area over the left first metatarsal head. This seems to have had more depth today at 0.6 cm. We have been using polymen. The patient changes this himself every second day. I explored the idea of a total contact cast on the left leg however this is his driving foot. He was not enamored with the idea of not being able to drive and states that he does not have anybody else to get him around 3/28; again he comes in with a smaller looking wound however there is depth and undermining requiring debridement. We have been using Hydrofera Blue, changed to silver collagen today. The patient is doing the dressing himself 4/11; patient presents today for follow-up of his left diabetic foot wound. He denies any issues in the past 2 weeks. He is currently using silver collagen every other day with dressing changes. He does not use diabetic shoes or special inserts to his sneakers. He does not use felt pads for offloading. 4/19; patient presents for  his 1 week follow-up. He denies any issues. He reports using Hydrofera Blue and has not been using silver collagen for dressing changes. He reports minimal drainage to the wound. He overall feels well. 5/3; patient presents for 2-week follow-up. He has been using silver collagen without issues. He has no complaints today and states he overall feels well. 5/17; patient presents for 2-week follow-up. He states that the sleeve is rubbing up against the back of his right leg and causing a wound. He states this happened a week ago and has not been dressing the wound. The plantar wound is stable and he has been using collagen every other day. 5/24; Patient was had a recent hospitalization for cellulitis of his right popliteal fossa. He was admitted and started on IV antibiotics. He does not recall the plantar wound dressed while inpatient. He feels a lot better today. He was discharged on 5/21 on oral antibiotics and has not picked this up until today. He reports using collagen to the plantar wound since discharge. He is keeping the right posterior knee wound covered. He is getting a new sleeve for his prostatic on 5/26. He currently denies signs of infection. 6/1; patient presents for 1 week follow-up. He is still on doxycycline. He has not been dressing the back of the right knee wound. He states he is receiving his new prosthetic sleeve tomorrow. He states he is using silver alginate to the plantar foot wound. He currently denies signs of infection. He states he has not been offloading the left foot wound 6/7; patient presents for 1 week follow-up. He completes his last dose of doxycycline today. He finally received his prosthetic sleeve for the right side and he states it feels much better. He has been using antibiotic ointment to the back of that right knee wound. He has been using silver alginate to the plantar foot wound. He currently denies signs of infection. 6/21; patient presents for 2-week  follow-up. He reports no issues with his prosthetic sleeve. He has been using antibiotic ointment  to the back of the right knee. He has been using silver alginate to the plantar foot wound. He has no issues or complaints today. He denies signs of infection. 7/5; patient presents for 2-week follow-up. He reports no issues or complaints today. He continues to use antibiotic ointment to the right posterior knee wound and silver alginate to the left plantar foot wound. He denies signs of infection. 7/19; patient presents for 2-week follow-up. He continues to use antibiotic ointment to the right posterior knee and silver alginate to the left plantar foot wound. He has no issues or complaints today. He denies signs of infection. 8/4; patient presents for 2-week follow-up. He has no issues or complaints today. He denies signs of infection. 8/18; patient presents for 2-week follow-up. He has no issues or concerns today. He has been approved for Apligraf and he would like to have this placed today. He denies signs of infection and reports that his right posterior knee wound is closed. 8/25; patient presents for 1 week follow-up. He had no issues with his first Apligraf placement last week. He did however develop an abscess to the back of his right knee. He reports tenderness to this area. He denies systemic signs of infection. 9/1; patient presents for 1 week follow-up. He has had no issues with his plantar foot wound. The Apligraf has stayed in place over the past week. A wound culture was done at last clinic visit and a new antibiotic was sent to the pharmacy. Patient has not received this yet. He reports some mild tenderness to the back of his right knee. He denies systemic signs of infection. Patient History Information obtained from Patient. Family History Diabetes - Maternal Grandparents,Mother, Heart Disease - Father, Hypertension - Father, No family history of Cancer, Hereditary Spherocytosis,  Kidney Disease, Lung Disease, Seizures, Stroke, Thyroid Problems, Tuberculosis. Social History Current every day smoker, Marital Status - Single, Alcohol Use - Never, Drug Use - No History, Caffeine Use - Never. Medical History Eyes Denies history of Cataracts, Glaucoma, Optic Neuritis Ear/Nose/Mouth/Throat Denies history of Chronic sinus problems/congestion, Middle ear problems Hematologic/Lymphatic Patient has history of Anemia Denies history of Hemophilia, Human Immunodeficiency Virus, Lymphedema, Sickle Cell Disease Respiratory Denies history of Aspiration, Asthma, Chronic Obstructive Pulmonary Disease (COPD), Pneumothorax, Sleep Apnea, Tuberculosis Cardiovascular Patient has history of Hypertension, Peripheral Venous Disease Denies history of Angina, Arrhythmia, Congestive Heart Failure, Coronary Artery Disease, Deep Vein Thrombosis, Hypotension, Myocardial Infarction, Peripheral Arterial Disease, Phlebitis, Vasculitis Gastrointestinal Denies history of Cirrhosis , Colitis, Crohnoos, Hepatitis A, Hepatitis B, Hepatitis C Endocrine Patient has history of Type II Diabetes - oral meds Denies history of Type I Diabetes Genitourinary Denies history of End Stage Renal Disease Immunological Denies history of Lupus Erythematosus, Raynaudoos, Scleroderma Integumentary (Skin) Denies history of History of Burn Musculoskeletal Denies history of Gout, Rheumatoid Arthritis, Osteoarthritis, Osteomyelitis Neurologic Denies history of Dementia, Neuropathy, Quadriplegia, Paraplegia, Seizure Disorder Oncologic Denies history of Received Chemotherapy, Received Radiation Psychiatric Denies history of Anorexia/bulimia, Confinement Anxiety Hospitalization/Surgery History - fever 105, sepsis cellulitis right popliteal fossa 04/09/2021. Medical A Surgical History Notes nd Constitutional Symptoms (General Health) falls , leukocytosis Respiratory During waking hours and catches himself not  breathing, "gasp for air". Saw Dr Wynonia Lawman, he couldn't find a reason Objective Constitutional respirations regular, non-labored and within target range for patient.. Vitals Time Taken: 10:53 AM, Height: 77 in, Weight: 232 lbs, BMI: 27.5, Temperature: 97.9 F, Pulse: 88 bpm, Respiratory Rate: 18 breaths/min, Blood Pressure: 145/89 mmHg. General Notes: Patient states has not  checked blood sugar today Cardiovascular 2+ dorsalis pedis/posterior tibialis pulses. Psychiatric pleasant and cooperative. General Notes: Left first met head: Wound with granulation tissue present. No signs of infection. Posterior right knee: Pinpoint opening with scant purulent drainage on palpation. Tenderness to palpation. No increased warmth or erythema to the area. Integumentary (Hair, Skin) Wound #2R status is Open. Original cause of wound was Blister. The date acquired was: 06/23/2020. The wound has been in treatment 48 weeks. The wound is located on the Left Metatarsal head first. The wound measures 0.3cm length x 0.2cm width x 0.2cm depth; 0.047cm^2 area and 0.009cm^3 volume. There is Fat Layer (Subcutaneous Tissue) exposed. There is no tunneling or undermining noted. There is a small amount of serosanguineous drainage noted. The wound margin is well defined and not attached to the wound base. There is large (67-100%) red, pink granulation within the wound bed. There is no necrotic tissue within the wound bed. Wound #3R status is Open. Original cause of wound was Pressure Injury. The date acquired was: 03/31/2021. The wound has been in treatment 15 weeks. The wound is located on the Right,Posterior Knee. The wound measures 0.2cm length x 0.2cm width x 0.5cm depth; 0.031cm^2 area and 0.016cm^3 volume. There is Fat Layer (Subcutaneous Tissue) exposed. There is no tunneling or undermining noted. There is a medium amount of purulent drainage noted. The wound margin is well defined and not attached to the wound base. There  is large (67-100%) red, pink granulation within the wound bed. There is no necrotic tissue within the wound bed. 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 The plantar foot wound continues to show improvement in size and appearance. No signs of infection on exam. I recommended continuing Apligraf. Apligraf #3 was placed in standard fashion. The back of his right knee continues to be mildly tender on palpation and some scant yellow drainage was noted. The previous wound culture grew Proteus mirabilis sensitive to Keflex but not doxycycline (which he was originally started on). Attempted to call patient earlier in the week with no response. Antibiotic was sent to the pharmacy and patient has not picked up yet. I asked him to start this today. Procedures Wound #2R Pre-procedure diagnosis of Wound #2R is a Diabetic Wound/Ulcer of the Lower Extremity located on the Left Metatarsal head first. A skin graft procedure using a bioengineered skin substitute/cellular or tissue based product was performed by Kalman Shan, DO with the following instrument(s): Blade, Forceps, and Scissors. Apligraf was applied and secured with Steri-Strips. 10 sq cm of product was utilized and 34 sq cm was wasted due to Wound size, product size. Post Application, Adaptic was applied. A Time Out was conducted at 11:18, prior to the start of the procedure. The procedure was tolerated well. Post procedure Diagnosis Wound #2R: Same as Pre-Procedure . Plan Follow-up Appointments: Return Appointment in 1 week. - Dr. [facility] Other: - Prescription for Keflex sent to pharmacy, take as directed. Cellular or Tissue Based  Products: Wound #2R Left Metatarsal head first: Cellular or Tissue Based Product Type: - Apligraf #2, 07/24/21 Apligraf #3 Edema Control - Lymphedema / SCD / Other: Elevate legs to the level of the heart or above for 30 minutes daily and/or when sitting, a frequency of: Avoid standing  for long periods of time. WOUND #2R: - Metatarsal head first Wound Laterality: Left Cleanser: Soap and Water 1 x Per Day/15 Days Discharge Instructions: May shower and wash wound with dial antibacterial soap and water prior to dressing change. Cleanser: Wound Cleanser (Generic) 1 x Per Day/15 Days Discharge Instructions: Cleanse the wound with wound cleanser prior to applying a clean dressing using gauze sponges, not tissue or cotton balls. Peri-Wound Care: Sween Lotion (Moisturizing lotion) 1 x Per Day/15 Days Discharge Instructions: Apply moisturizing lotion as directed Secondary Dressing: Woven Gauze Sponges 2x2 in (Generic) 1 x Per OPF/29 Days Discharge Instructions: Apply over primary dressing as directed. Secondary Dressing: Optifoam Non-Adhesive Dressing, 4x4 in (Generic) 1 x Per Day/15 Days Discharge Instructions: Apply over primary dressing cut to make foam donut to help offload Secondary Dressing: ADAPTIC TOUCH 3x4.25 in 1 x Per Day/15 Days Discharge Instructions: Apply over skin sub Secured With: Conforming Stretch Gauze Bandage, Sterile 2x75 (in/in) (Generic) 1 x Per Day/15 Days Discharge Instructions: Secure with stretch gauze as directed. Secured With: 20M Medipore H Soft Cloth Surgical T ape, 2x2 (in/yd) (Generic) 1 x Per Day/15 Days Discharge Instructions: Secure dressing with tape as directed. WOUND #3R: - Knee Wound Laterality: Right, Posterior Cleanser: Soap and Water 2 x Per WKM/62 Days Discharge Instructions: May shower and wash wound with dial antibacterial soap and water prior to dressing change. Cleanser: Wound Cleanser 2 x Per Day/15 Days Discharge Instructions: Cleanse the wound with  wound cleanser prior to applying a clean dressing using gauze sponges, not tissue or cotton balls. Topical: Triple Antibiotic Ointment, 1 (oz) Tube 2 x Per Day/15 Days Secondary Dressing: Woven Gauze Sponge, Non-Sterile 4x4 in 2 x Per Day/15 Days Discharge Instructions: Apply over primary dressing as directed. Secured With: [employer], 4.5x3.1 (in/yd) 2 x Per Day/15 Days Discharge Instructions: Secure with Kerlix as directed. Secured With: 20M Medipore H Soft Cloth Surgical T 4 x 2 (in/yd) 2 x Per Day/15 Days ape Discharge Instructions: Secure dressing with tape as directed. 1. Apligraf #3 was placed in standard fashion today 2. Keflex 3. Follow-up in 1 week Electronic Signature(s) Signed: 07/24/2021 11:37:29 AM By: Kalman Shan DO Entered By: Kalman Shan on 07/24/2021 11:35:00 -------------------------------------------------------------------------------- HxROS Details Patient Name: Date of Service: [name], [name] 07/24/2021 10:45 A M Medical Record Number: [id number] Patient Account Number: [id number] Date of Birth/Sex: Treating RN: July 24, 1971 (50 y.o. [name] Primary Care Provider: [name] Other Clinician: Referring Provider: Treating Provider/Extender: Delano Metz in Treatment: 61 Information Obtained From Patient Constitutional Symptoms (General Health) Medical History: Past Medical History Notes: falls , leukocytosis Eyes Medical History: Negative for: Cataracts; Glaucoma; Optic Neuritis Ear/Nose/Mouth/Throat Medical History: Negative for: Chronic sinus problems/congestion; Middle ear problems Hematologic/Lymphatic Medical History: Positive for: Anemia Negative for: Hemophilia; Human Immunodeficiency Virus; Lymphedema; Sickle Cell Disease Respiratory Medical History: Negative for: Aspiration; Asthma; Chronic Obstructive Pulmonary Disease (COPD); Pneumothorax; Sleep Apnea; Tuberculosis Past Medical  History Notes: During waking hours and catches himself not breathing, "gasp for air". Saw Dr Wynonia Lawman, he couldn't find a reason Cardiovascular Medical History: Positive for: Hypertension; Peripheral Venous Disease Negative for: Angina; Arrhythmia; Congestive Heart Failure; Coronary Artery Disease; Deep Vein Thrombosis; Hypotension; Myocardial Infarction; Peripheral Arterial Disease; Phlebitis; Vasculitis Gastrointestinal Medical History: Negative for: Cirrhosis ; Colitis; Crohns; Hepatitis A; Hepatitis B; Hepatitis C Endocrine Medical History: Positive for: Type II Diabetes - oral meds Negative for: Type I Diabetes Time with diabetes: 2014 Treated with: Oral agents Blood sugar tested  every day: Yes Tested : Blood sugar testing results: Breakfast: 91 Genitourinary Medical History: Negative for: End Stage Renal Disease Immunological Medical History: Negative for: Lupus Erythematosus; Raynauds; Scleroderma Integumentary (Skin) Medical History: Negative for: History of Burn Musculoskeletal Medical History: Negative for: Gout; Rheumatoid Arthritis; Osteoarthritis; Osteomyelitis Neurologic Medical History: Negative for: Dementia; Neuropathy; Quadriplegia; Paraplegia; Seizure Disorder Oncologic Medical History: Negative for: Received Chemotherapy; Received Radiation Psychiatric Medical History: Negative for: Anorexia/bulimia; Confinement Anxiety Immunizations Pneumococcal Vaccine: Received Pneumococcal Vaccination: No Implantable Devices No devices added Hospitalization / Surgery History Type of Hospitalization/Surgery fever 105, sepsis cellulitis right popliteal fossa 04/09/2021 Family and Social History Cancer: No; Diabetes: Yes - Maternal Grandparents,Mother; Heart Disease: Yes - Father; Hereditary Spherocytosis: No; Hypertension: Yes - Father; Kidney Disease: No; Lung Disease: No; Seizures: No; Stroke: No; Thyroid Problems: No; Tuberculosis: No; Current every day  smoker; Marital Status - Single; Alcohol Use: Never; Drug Use: No History; Caffeine Use: Never; Financial Concerns: No; Food, Clothing or Shelter Needs: No; Support System Lacking: No; Transportation Concerns: No Electronic Signature(s) Signed: 07/24/2021 11:37:29 AM By: Kalman Shan DO Signed: 07/24/2021 4:23:50 PM By: Rhae Hammock RN Entered By: Kalman Shan on 07/24/2021 11:29:52 -------------------------------------------------------------------------------- SuperBill Details Patient Name: Date of Service: [name], [name] 07/24/2021 Medical Record Number: [id number] Patient Account Number: [id number] Date of Birth/Sex: Treating RN: [birth year] (50 y.o. [name] Primary Care Provider: [name] Other Clinician: Referring Provider: Treating Provider/Extender: Delano Metz in Treatment: 48 Diagnosis Coding ICD-10 Codes Code Description [phone] Non-pressure chronic ulcer of other part of left foot with fat layer exposed E11.621 Type 2 diabetes mellitus with foot ulcer S81.801D Unspecified open wound, right lower leg, subsequent encounter E11.42 Type 2 diabetes mellitus with diabetic polyneuropathy Z89.511 Acquired absence of right leg below knee Facility Procedures CPT4 Code: [id number] Description: (Facility Use Only) Apligraf 1 SQ CM Modifier: Quantity: 22 CPT4 Code: [id number] Description: 65681 - SKIN SUB GRAFT FACE/NK/HF/G ICD-10 Diagnosis Description L97.522 Non-pressure chronic ulcer of other part of left foot with fat layer exposed Modifier: Quantity: 1 Physician Procedures : CPT4 Code Description Modifier [id number] - WC PHYS SKIN SUB GRAFT FACE/NK/HF/G ICD-10 Diagnosis Description L97.522 Non-pressure chronic ulcer of other part of left foot with fat layer exposed Quantity: 1 Electronic Signature(s) Signed: 07/24/2021 11:37:29 AM By: Kalman Shan DO Entered By: Kalman Shan on 07/24/2021 11:35:47

## 2021-07-30 ENCOUNTER — Ambulatory Visit (INDEPENDENT_AMBULATORY_CARE_PROVIDER_SITE_OTHER): Payer: Medicare Other | Admitting: Gastroenterology

## 2021-07-30 ENCOUNTER — Encounter: Payer: Self-pay | Admitting: Gastroenterology

## 2021-07-30 VITALS — BP 102/60 | HR 105 | Ht 77.0 in | Wt 234.0 lb

## 2021-07-30 DIAGNOSIS — R112 Nausea with vomiting, unspecified: Secondary | ICD-10-CM | POA: Diagnosis not present

## 2021-07-30 NOTE — Progress Notes (Signed)
[facility] Progress Note  Chief Complaint: Nausea and vomiting  Subjective  History: [name] [name] was seen in follow-up today after his July 13 upper endoscopy.  Clinic visit 04/29/2021 details of his clinical history.  Gastric emptying study was normal while not on antiemetic medicines.  Upper endoscopy unremarkable.  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  He was treated with primarily ondansetron 8 mg every 8 hours as needed alternating with metoclopramide 5 mg up to 3 times daily as needed for breakthrough symptoms.  [name] [name] still has been having trouble since I last saw him.  He currently takes Zofran 8 mg and metoclopramide 5 mg before his first meal of the day (around noon), and the same before his evening meal around 7 PM.  He does not get nausea during the day, and does not feel this medication regimen is helping.  More days than not around 10 or 11 PM he will feel something in his throat and began coughing and then vomit.  It is not clear if it is preceded by nausea, and history is somewhat difficult to obtain due to his restricted affect.  He does not seem to have abdominal pain before the vomiting occurs.  He checks his blood sugar once a day before evening meal and says sometimes it runs about 120.  I reviewed his recent endocrinology note from August 15, had the initial consult in that clinic in July.  He is on regimen described below without insulin.  They recommended he check his sugar twice a day  ROS: Cardiovascular:  no chest pain Respiratory: no dyspnea Depressed mood The patient's Past Medical, Family and Social History were reviewed and are on file in the EMR.  Objective:  Med list reviewed  Current Outpatient Medications:    AGAMATRIX ULTRA-THIN LANCETS MISC, Check blood sugars before meals twice daily, Disp: 100 each, Rfl: 11   amLODipine (NORVASC) 10 MG tablet, Take 10 mg by mouth every  morning. , Disp: , Rfl:    ARIPiprazole (ABILIFY) 20 MG tablet, Take 20 mg by mouth every morning., Disp: , Rfl:    atorvastatin (LIPITOR) 20 MG tablet, Take 20 mg by mouth every morning., Disp: , Rfl:    baclofen (LIORESAL) 10 MG tablet, 1/2 tab twice a day (Patient taking differently: Take 20 mg by mouth 2 (two) times daily.), Disp: 30 each, Rfl: 6   Blood Glucose Monitoring Suppl (AGAMATRIX PRESTO) w/Device KIT, Check sugars twice daily before meals, Disp: 1 kit, Rfl: 0   dapagliflozin propanediol (FARXIGA) 10 MG TABS tablet, Take 10 mg by mouth daily., Disp: , Rfl:    desvenlafaxine (PRISTIQ) 100 MG 24 hr tablet, Take 100 mg by mouth daily., Disp: , Rfl:    Dulaglutide (TRULICITY) 9.29 WK/4.6KM SOPN, Inject 0.75 mg into the skin once a week. (Patient taking differently: Inject 0.75 mg into the skin once a week. Friday), Disp: 6 mL, Rfl: 3   DULoxetine (CYMBALTA) 60 MG capsule, Take 60 mg by mouth 2 (two) times daily. , Disp: , Rfl:    FLUoxetine (PROZAC) 40 MG capsule, Take 40 mg by mouth every morning., Disp: , Rfl:    fluticasone (FLONASE) 50 MCG/ACT nasal spray, Place 2 sprays into both nostrils daily as needed for allergies or rhinitis. , Disp: , Rfl:    gabapentin (NEURONTIN) 300 MG capsule, 1 cap by mouth in morning and midday, 2 caps by mouth at bedtime (Patient taking differently:  Take 300-600 mg by mouth See admin instructions. Take one capsule (300 mg) by mouth in the morning and afternoon, take two capsules (600 mg) at bedtime), Disp: 120 capsule, Rfl: 11   glipiZIDE (GLUCOTROL) 5 MG tablet, 1/2 tab by mouth twice daily with meal (Patient taking differently: Take 5 mg by mouth daily. With dinner meal), Disp: 15 tablet, Rfl: 11   glucose blood (AGAMATRIX PRESTO TEST) test strip, Check blood sugars twice daily before meals, Disp: 100 each, Rfl: 12   Insulin NPH, Human,, Isophane, (HUMULIN N KWIKPEN) 100 UNIT/ML Kiwkpen, Inject 5 Units into the skin every morning. And pen needles 1/day,  Disp: 15 mL, Rfl: 11   Insulin Pen Needle (PEN NEEDLES) 30G X 5 MM MISC, check blood sugar 4 times a day, Disp: 100 each, Rfl: 3   lamoTRIgine (LAMICTAL) 100 MG tablet, Take 100 mg by mouth 2 (two) times daily., Disp: , Rfl:    lisinopril (ZESTRIL) 20 MG tablet, Take 1 tablet (20 mg total) by mouth daily., Disp: , Rfl:    metoCLOPramide (REGLAN) 5 MG tablet, Take 1 tablet (5 mg total) by mouth every 8 (eight) hours as needed for nausea or vomiting., Disp: 45 tablet, Rfl: 1   metoprolol tartrate (LOPRESSOR) 25 MG tablet, Take 25 mg by mouth 2 (two) times daily., Disp: , Rfl:    ondansetron (ZOFRAN) 8 MG tablet, Take 8 mg by mouth every 8 (eight) hours as needed for nausea or vomiting., Disp: , Rfl:    pantoprazole (PROTONIX) 40 MG tablet, Take 1 tablet (40 mg total) by mouth 2 (two) times daily., Disp: 60 tablet, Rfl: 2   Vital signs in last 24 hrs: Vitals:   07/30/21 1410  BP: 102/60  Pulse: (!) 105   Wt Readings from Last 3 Encounters:  07/30/21 234 lb (106.1 kg)  06/04/21 229 lb (103.9 kg)  05/27/21 234 lb 6.4 oz (106.3 kg)    Physical Exam  Not acutely ill-appearing.  No muscle wasting, well hydrated.  Ambulatory, gets on exam table without difficulty.  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 No guarding or palpable hepatosplenomegaly. Skin; warm and dry, no jaundice or rash  Labs:   ___________________________________________ Radiologic studies: Small gallstones on 07/2019 CTAP  ____________________________________________ Other: QT interval normal on last EKG 04/09/2021  _____________________________________________ Assessment & Plan  Assessment: Encounter Diagnosis  Name Primary?   Nausea and vomiting in adult Yes   This remains difficult to understand, but I suspect it is still  related to intermittent hyperglycemia.  It is curious how he describes having coughing before it occurs, but he does not have coughing during the day, and I wonder if that is due to could have of some underlying reflux.  However, he does not get heartburn during the day.  He also does not have abdominal pain to suggest obstruction or biliary colic. No retained food in the stomach on recent normal upper endoscopy, normal gastric emptying study.  His evening meal is reportedly his larger meal of the day, so perhaps he has delayed emptying after that, perhaps coinciding with spiking glucose within a couple hours after that meal.   Plan: Change medication plan as follows:  Stop the ondansetron and metoclopramide before the midday meal  Stop the ondansetron before evening meal.  Instead take metoclopramide 10 mg (2 tablets) before evening  meal. I also recommended he check his blood sugar before both meals, as well as about 2 hours after evening meal and again if he vomits late in the evening.  I would like him to keep a written log of that so he can bring it to his endocrinologist and see if adjustments in his dietary intake and/or medicine regimen may be needed  I will have him follow-up with me in about 2 months or sooner if needed.  Note will be copied to primary care and endocrinology.  30 minutes were spent on this encounter (including chart review, history/exam, counseling/coordination of care, and documentation) > 50% of that time was spent on counseling and coordination of care.   Nelida Meuse III

## 2021-07-30 NOTE — Patient Instructions (Addendum)
If you are age 48 or older, your body mass index should be between 23-30. Your Body mass index is 27.75 kg/m. If this is out of the aforementioned range listed, please consider follow up with your Primary Care Provider.  If you are age 38 or younger, your body mass index should be between 19-25. Your Body mass index is 27.75 kg/m. If this is out of the aformentioned range listed, please consider follow up with your Primary Care Provider.   __________________________________________________________  The [facility] providers would like to encourage you to use [facility] to communicate with providers for non-urgent requests or questions.  Due to long hold times on the telephone, sending your provider a message by [facility] may be a faster and more efficient way to get a response.  Please allow 48 business hours for a response.  Please remember that this is for non-urgent requests.   PLEASE stop the Reglan and Zofran before lunch. With your evening meal take 10 mg of Reglan ( 2 tablets ).  Follow up in 2 months   It was a pleasure to see you today!  Thank you for trusting me with your gastrointestinal care!

## 2021-07-31 ENCOUNTER — Encounter (HOSPITAL_BASED_OUTPATIENT_CLINIC_OR_DEPARTMENT_OTHER): Payer: Medicare Other | Admitting: Internal Medicine

## 2021-07-31 ENCOUNTER — Other Ambulatory Visit (HOSPITAL_COMMUNITY): Payer: Self-pay | Admitting: Internal Medicine

## 2021-07-31 ENCOUNTER — Other Ambulatory Visit: Payer: Self-pay

## 2021-07-31 DIAGNOSIS — L0291 Cutaneous abscess, unspecified: Secondary | ICD-10-CM

## 2021-07-31 DIAGNOSIS — L97522 Non-pressure chronic ulcer of other part of left foot with fat layer exposed: Secondary | ICD-10-CM

## 2021-07-31 DIAGNOSIS — E11621 Type 2 diabetes mellitus with foot ulcer: Secondary | ICD-10-CM | POA: Diagnosis not present

## 2021-07-31 NOTE — Progress Notes (Signed)
IRFAN, VEAL ([id number]) Visit Report for 07/31/2021 Chief Complaint Document Details Patient Name: Date of Service: [name] [name] 07/31/2021 10:45 A M Medical Record Number: [id number] Patient Account Number: [id number] Date of Birth/Sex: Treating RN: [birth year] (50 y.o. [name], [name] Primary Care Provider: [name] Other Clinician: Referring Provider: Treating Provider/Extender: Delano Metz in Treatment: 56 Information Obtained from: Patient Chief Complaint patient is here for a review of the wound on his plantar left first metatarsal head and back of his right leg Electronic Signature(s) Signed: 07/31/2021 11:52:03 AM By: Kalman Shan DO Entered By: Kalman Shan on 07/31/2021 11:33:32 -------------------------------------------------------------------------------- Cellular or Tissue Based Product Details Patient Name: Date of Service: [name] [name], [name] 07/31/2021 10:45 A M Medical Record Number: [id number] Patient Account Number: [id number] Date of Birth/Sex: Treating RN: 10-29-1971 (50 y.o. [name], [name] Primary Care Provider: [name] Other Clinician: Referring Provider: Treating Provider/Extender: Delano Metz in Treatment: 82 Cellular or Tissue Based Product Type Wound #2R Left Metatarsal head first Applied to: Performed By: Physician Kalman Shan, DO Cellular or Tissue Based Product Type: Apligraf Level of Consciousness (Pre-procedure): Awake and Alert Pre-procedure Verification/Time Out Yes - 11:11 Taken: Location: genitalia / hands / feet / multiple digits Wound Size (sq cm): 0.04 Product Size (sq cm): 44 Waste Size (sq cm): 33 Waste Reason: wound size Amount of Product Applied (sq cm): 11 Instrument Used: Forceps, Scissors Lot #: [id number] Expiration Date: 08/08/2021 Fenestrated: Yes Instrument: Blade Reconstituted: Yes Solution Type: NS Solution  Amount: 10 mL Lot #: [id number] Solution Expiration Date: 07/24/2022 Secured: Yes Secured With: Steri-Strips Dressing Applied: Yes Primary Dressing: adaptic Procedural Pain: 0 Post Procedural Pain: 0 Response to Treatment: Procedure was tolerated well Level of Consciousness (Post- Awake and Alert procedure): Post Procedure Diagnosis Same as Pre-procedure Electronic Signature(s) Signed: 07/31/2021 11:52:03 AM By: Kalman Shan DO Signed: 07/31/2021 6:08:32 PM By: Rhae Hammock RN Entered By: Rhae Hammock on 07/31/2021 11:08:48 -------------------------------------------------------------------------------- Debridement Details Patient Name: Date of Service: [name] [name], [name] 07/31/2021 10:45 A M Medical Record Number: [id number] Patient Account Number: [id number] Date of Birth/Sex: Treating RN: 05-Jun-1971 (50 y.o. [name], [name] Primary Care Provider: [name] Other Clinician: Referring Provider: Treating Provider/Extender: Delano Metz in Treatment: 49 Debridement Performed for Assessment: Wound #2R Left Metatarsal head first Performed By: Physician Kalman Shan, DO Debridement Type: Debridement Severity of Tissue Pre Debridement: Fat layer exposed Level of Consciousness (Pre-procedure): Awake and Alert Pre-procedure Verification/Time Out Yes - 11:04 Taken: Start Time: 11:04 Pain Control: Lidocaine T Area Debrided (L x W): otal 0.2 (cm) x 0.2 (cm) = 0.04 (cm) Tissue and other material debrided: Viable, Non-Viable, Callus, Subcutaneous, Skin: Dermis , Skin: Epidermis Level: Skin/Subcutaneous Tissue Debridement Description: Excisional Instrument: Curette Bleeding: Minimum Hemostasis Achieved: Pressure End Time: 11:04 Procedural Pain: 0 Post Procedural Pain: 0 Response to Treatment: Procedure was tolerated well Level of Consciousness (Post- Awake and Alert procedure): Post Debridement Measurements of Total  Wound Length: (cm) 0.2 Width: (cm) 0.2 Depth: (cm) 0.3 Volume: (cm) 0.009 Character of Wound/Ulcer Post Debridement: Improved Severity of Tissue Post Debridement: Fat layer exposed Post Procedure Diagnosis Same as Pre-procedure Electronic Signature(s) Signed: 07/31/2021 11:52:03 AM By: Kalman Shan DO Signed: 07/31/2021 6:08:32 PM By: Rhae Hammock RN Entered By: Rhae Hammock on 07/31/2021 11:05:26 -------------------------------------------------------------------------------- HPI Details Patient Name: Date of Service: [name] [name], [name] 07/31/2021 10:45 A M Medical Record Number: [id number] Patient Account Number: [id number] Date of Birth/Sex:  Treating RN: 1971-03-11 (50 y.o. [name] [name] Primary Care Provider: [name] Other Clinician: Referring Provider: Treating Provider/Extender: Delano Metz in Treatment: 70 History of Present Illness HPI Description: 04/21/18 ADMISSION This is a 50 year old man who is a type II diabetic. In spite of fact his hemoglobin A1c is actually quite good 5.73 months ago he is at a really difficult time over the last 6-7 months. He developed a rapidly progressive infection in the right foot in November 2018 associated with osteomyelitis and necrotizing fasciitis. 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 The stump required and a revision on 11/24/17. The stump revision was advertised as being secondary to falls. I'm not sure the progressive history here however this area is actually closed over. The patient tells me over the same timeframe he has had wounds on the plantar aspect of his left foot. In the ED saw Dr. Sharol Given in April of this year. 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 It is really not clear that this patient is been dressing this with anything. He came in with the clinic without any specific dressing on the wound areas using his own tennis shoe.  He tells me that he only ambulates of course to do a pivot transfer. He does not have his prosthesis for the right leg as of yet and he blames Medicaid for this. He does however use the foot to push himself along in his wheelchair at home. The patient has not had formal arterial studies. ABI in our clinic on the left was 1.13. Patient's past medical history includes type 2 diabetes, fracture of the right fibula. I see that he was treated for abscesses on his right buttock and chest in 2016. I did not look at the microbiology of this. 04/28/18; patient comes back in the clinic today with the wound pretty much the same as when he came in here last week. Small opening lots of undermining relatively. He tells [place] that nothing is really been on this for 3 days in spite of the fact that we gave him enough to dress this easily especially such a small wound. He says he lives with his mother, she is not capable of assisting with this he is changing the dressings himself. 05/05/18; much better-looking wound today. Smaller. There is some undermining medially however that's only perhaps 2 mm. No surrounding erythema. We've been using silver collagen 05/12/18; small wound on the first metatarsal head. No undermining no surrounding erythema. We've been using silver collagen he has home health coming out to change 05/20/18 the wound on the first metatarsal head looks better. Covered in surface debris/callus nonviable tissue. Required debridement but post debridement this looks quite good. We've been using silver collagen. He has home health 05/27/18; first metatarsal head wound continues to improve. Just about completely closed. Still a lot of surface debridement callus. We've been using silver collagen 06/06/18; the first metatarsal wound is completely healed over. There is still a lot of callus. I gently removed some of this just to make sure that there was no open area and there is not. The patient mentions to me that  his left leg has felt like "lead" for about a week READMISSION 08/16/2020 This is a 50 year old man with type 2 diabetes. He returns to clinic today with a 1 month history of a blister and callus over the first metatarsal head. This is in exactly the same area as when  he was here in 2019. 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 He has standard running shoes on the left foot to match the area in his prosthesis. He has only been covering this area with a Band-Aid has not really been specifically dressing this. ABI in our clinic on the left was 1.16. This is essentially stable from the value in 2019 10/1 the patient has a linear wound over the left first metatarsal head. Again not a lot of callus thick skin around this that I removed with a #3 curette. This is not go deep to bone or muscle it does not look to be infected. 10/8 first metatarsal head. The wound looks like it is closing however this is going to be a difficult area to offload in the future. He has a size 15 shoe and he says his shoes are years old. The inserts in these current shoes are totally useless and I have advised him to replace these. He may need a new pair of shoes and I have he got original prosthesis at [facility] [place] I have asked him to go back there. 11/9; the patient has not been here in more than a month. Not sure he was healed when he was here the last time. I told him he would have to get new shoes and certainly offload the area over the left first metatarsal head. He has done nothing of the kind. He arrives back in clinic with the same old new balance sneakers. A very large separating callus over the first metatarsal head on the left 11/16; he has purchased new shoes and has at least 2 insoles like I asked. The wound is measuring much smaller. He is using silver alginate he changes the dressing himself 11/30; wound is measurably smaller in length of about a half a  centimeter. This is generally very little depth. We have been using silver alginate. He changes the dressing himself every second day. 12/14; wound is about the same size. Significant undermining medially relative to the size of the wound. We have been using silver alginate. There is not a way to offload this area as he walks with a prosthesis on the right leg 12/28; wound is about the same size. Again he has undermining medially. I am using polymen on this 12/02/2020; the wound looks smaller. Still a senescent edge. We have been using polymen 1/24; the wound is come down a few millimeters. Still a senescent edge. I have been using polymen 2/8; the wound is come down slightly. Still with slight undermining from 12-6 o'clock I have been using polymen partially to get offloading on this area which is opposed by his prosthesis on the other leg. 2/22 quite open improvement he still has a comma shaped wound on the left first metatarsal head. Some depth. Using polymen 3/14; he still has the same problem of a comma shaped area over the left first metatarsal head. This seems to have had more depth today at 0.6 cm. We have been using polymen. The patient changes this himself every second day. I explored the idea of a total contact cast on the left leg however this is his driving foot. He was not enamored with the idea of not being able to drive and states that he does not have anybody else to get him around 3/28; again he comes in with a smaller looking wound however there is depth and undermining requiring debridement.  We have been using Hydrofera Blue, changed to silver collagen today. The patient is doing the dressing himself 4/11; patient presents today for follow-up of his left diabetic foot wound. He denies any issues in the past 2 weeks. He is currently using silver collagen every other day with dressing changes. He does not use diabetic shoes or special inserts to his sneakers. He does not use felt  pads for offloading. 4/19; patient presents for his 1 week follow-up. He denies any issues. He reports using Hydrofera Blue and has not been using silver collagen for dressing changes. He reports minimal drainage to the wound. He overall feels well. 5/3; patient presents for 2-week follow-up. He has been using silver collagen without issues. He has no complaints today and states he overall feels well. 5/17; patient presents for 2-week follow-up. He states that the sleeve is rubbing up against the back of his right leg and causing a wound. He states this happened a week ago and has not been dressing the wound. The plantar wound is stable and he has been using collagen every other day. 5/24; Patient was had a recent hospitalization for cellulitis of his right popliteal fossa. He was admitted and started on IV antibiotics. He does not recall the plantar wound dressed while inpatient. He feels a lot better today. He was discharged on 5/21 on oral antibiotics and has not picked this up until today. He reports using collagen to the plantar wound since discharge. He is keeping the right posterior knee wound covered. He is getting a new sleeve for his prostatic on 5/26. He currently denies signs of infection. 6/1; patient presents for 1 week follow-up. He is still on doxycycline. He has not been dressing the back of the right knee wound. He states he is receiving his new prosthetic sleeve tomorrow. He states he is using silver alginate to the plantar foot wound. He currently denies signs of infection. He states he has not been offloading the left foot wound 6/7; patient presents for 1 week follow-up. He completes his last dose of doxycycline today. He finally received his prosthetic sleeve for the right side and he states it feels much better. He has been using antibiotic ointment to the back of that right knee wound. He has been using silver alginate to the plantar foot wound. He currently denies signs of  infection. 6/21; patient presents for 2-week follow-up. He reports no issues with his prosthetic sleeve. He has been using antibiotic ointment to the back of the right knee. He has been using silver alginate to the plantar foot wound. He has no issues or complaints today. He denies signs of infection. 7/5; patient presents for 2-week follow-up. He reports no issues or complaints today. He continues to use antibiotic ointment to the right posterior knee wound and silver alginate to the left plantar foot wound. He denies signs of infection. 7/19; patient presents for 2-week follow-up. He continues to use antibiotic ointment to the right posterior knee and silver alginate to the left plantar foot wound. He has no issues or complaints today. He denies signs of infection. 8/4; patient presents for 2-week follow-up. He has no issues or complaints today. He denies signs of infection. 8/18; patient presents for 2-week follow-up. He has no issues or concerns today. He has been approved for Apligraf and he would like to have this placed today. He denies signs of infection and reports that his right posterior knee wound is closed. 8/25; patient presents for 1 week follow-up.  He had no issues with his first Apligraf placement last week. He did however develop an abscess to the back of his right knee. He reports tenderness to this area. He denies systemic signs of infection. 9/1; patient presents for 1 week follow-up. He has had no issues with his plantar foot wound. The Apligraf has stayed in place over the past week. A wound culture was done at last clinic visit and a new antibiotic was sent to the pharmacy. Patient has not received this yet. He reports some mild tenderness to the back of his right knee. He denies systemic signs of infection. 9/8; patient presents for 1 week follow-up. He states he took Keflex and had diarrhea as a side effect. He was able to complete the course. He reports continued tenderness  to the right posterior knee wound. He has some drainage still. He denies increased warmth or erythema to the area. He denies fever/chills or nausea/vomiting. He has no issues or complaints today about his plantar foot wound. Electronic Signature(s) Signed: 07/31/2021 11:52:03 AM By: Kalman Shan DO Entered By: Kalman Shan on 07/31/2021 11:34:30 -------------------------------------------------------------------------------- Physical Exam Details Patient Name: Date of Service: [name] [name], [name] 07/31/2021 10:45 A M Medical Record Number: [id number] Patient Account Number: [id number] Date of Birth/Sex: Treating RN: [birth year] (50 y.o. [name] [name] Primary Care Provider: [name] Other Clinician: Referring Provider: Treating Provider/Extender: Delano Metz in Treatment: 45 Constitutional respirations regular, non-labored and within target range for patient.. Cardiovascular 2+ dorsalis pedis/posterior tibialis pulses. Psychiatric pleasant and cooperative. Notes Left first met head: Wound with granulation tissue present. No signs of infection. Posterior right knee: No opening noted on exam however there was yellow drainage on the dressing. Tenderness to palpation. No increased warmth or erythema to the area. Electronic Signature(s) Signed: 07/31/2021 11:52:03 AM By: Kalman Shan DO Signed: 07/31/2021 11:52:03 AM By: Kalman Shan DO Entered By: Kalman Shan on 07/31/2021 11:35:32 -------------------------------------------------------------------------------- Physician Orders Details Patient Name: Date of Service: [name] [name], [name] 07/31/2021 10:45 A M Medical Record Number: [id number] Patient Account Number: [id number] Date of Birth/Sex: Treating RN: [birth year] (50 y.o. [name], [name] Primary Care Provider: [name] Other Clinician: Referring Provider: Treating Provider/Extender: Delano Metz in Treatment: 74 Verbal / Phone Orders: No Diagnosis Coding ICD-10 Coding Code Description [phone] Non-pressure chronic ulcer of other part of left foot with fat layer exposed E11.621 Type 2 diabetes mellitus with foot ulcer S81.801D Unspecified open wound, right lower leg, subsequent encounter E11.42 Type 2 diabetes mellitus with diabetic polyneuropathy Z89.511 Acquired absence of right leg below knee Follow-up Appointments ppointment in 1 week. - Dr. Heber [place] Return A Cellular or Tissue Based Products Wound #2R Left Metatarsal head first Cellular or Tissue Based Product Type: - Apligraf #4 Edema Control - Lymphedema / SCD / Other Elevate legs to the level of the heart or above for 30 minutes daily and/or when sitting, a frequency of: Avoid standing for long periods of time. Wound Treatment Wound #2R - Metatarsal head first Wound Laterality: Left Cleanser: Soap and Water 1 x Per UMP/53 Days Discharge Instructions: May shower and wash wound with dial antibacterial soap and water prior to dressing change. Cleanser: Wound Cleanser (Generic) 1 x Per Day/15 Days Discharge Instructions: Cleanse the wound with wound cleanser prior to applying a clean dressing using gauze sponges, not tissue or cotton balls. Peri-Wound Care: Sween Lotion (Moisturizing lotion) 1 x Per Day/15 Days Discharge Instructions: Apply moisturizing lotion as directed Secondary  Dressing: Woven Gauze Sponges 2x2 in (Generic) 1 x Per Day/15 Days Discharge Instructions: Apply over primary dressing as directed. Secondary Dressing: Optifoam Non-Adhesive Dressing, 4x4 in (Generic) 1 x Per Day/15 Days Discharge Instructions: Apply over primary dressing cut to make foam donut to help offload Secondary Dressing: ADAPTIC TOUCH 3x4.25 in 1 x Per Day/15 Days Discharge Instructions: Apply over skin sub Secured With: Conforming Stretch Gauze Bandage, Sterile 2x75 (in/in) (Generic) 1 x Per Day/15 Days Discharge  Instructions: Secure with stretch gauze as directed. Secured With: 31M Medipore H Soft Cloth Surgical Tape, 2x2 (in/yd) (Generic) 1 x Per Day/15 Days Discharge Instructions: Secure dressing with tape as directed. [facility] ultrasound - STAT r/t possible abscess Patient Medications llergies: adhesive tape A Notifications Medication Indication Start End 07/31/2021 Augmentin DOSE 1 - oral 875 mg-125 mg tablet - 1 tablet oral BID for 5 days Electronic Signature(s) Signed: 07/31/2021 11:52:03 AM By: Kalman Shan DO Previous Signature: 07/31/2021 11:50:32 AM Version By: Kalman Shan DO Entered By: Kalman Shan on 07/31/2021 11:50:56 Prescription 07/31/2021 -------------------------------------------------------------------------------- Sandhu, Gerrod L. Kalman Shan DO Patient Name: Provider: Sep 10, 1971 [id number] Date of Birth: NPI#: [name] [id number] Sex: DEA #: [phone] Phone #: License #: [place] Patient Address: [address] [place], [place] [address] [place], [place] [address] [phone] Allergies adhesive tape Provider's Orders ultrasound - STAT r/t possible abscess Hand Signature: Date(s): Electronic Signature(s) Signed: 07/31/2021 11:52:03 AM By: Kalman Shan DO Entered By: Kalman Shan on 07/31/2021 11:50:56 -------------------------------------------------------------------------------- Problem List Details Patient Name: Date of Service: [name] [name], [name] 07/31/2021 10:45 A M Medical Record Number: [id number] Patient Account Number: [id number] Date of Birth/Sex: Treating RN: 1971/11/15 (50 y.o. [name], [name] Primary Care Provider: [name] Other Clinician: Referring Provider: Treating Provider/Extender: Delano Metz in Treatment: 5 Active Problems ICD-10 Encounter Code Description Active Date  MDM Diagnosis [phone] Non-pressure chronic ulcer of other part of left foot with fat layer exposed 08/16/2020 No Yes E11.621 Type 2 diabetes mellitus with foot ulcer 08/16/2020 No Yes S81.801D Unspecified open wound, right lower leg, subsequent encounter 04/08/2021 No Yes E11.42 Type 2 diabetes mellitus with diabetic polyneuropathy 08/16/2020 No Yes Z89.511 Acquired absence of right leg below knee 08/16/2020 No Yes Inactive Problems Resolved Problems Electronic Signature(s) Signed: 07/31/2021 11:52:03 AM By: Kalman Shan DO Entered By: Kalman Shan on 07/31/2021 11:33:12 -------------------------------------------------------------------------------- Progress Note Details Patient Name: Date of Service: [name] [name], [name] 07/31/2021 10:45 A M Medical Record Number: [id number] Patient Account Number: [id number] Date of Birth/Sex: Treating RN: 1971-03-27 (50 y.o. [name], [name] Primary Care Provider: [name] Other Clinician: Referring Provider: Treating Provider/Extender: Delano Metz in Treatment: 62 Subjective Chief Complaint Information obtained from Patient patient is here for a review of the wound on his plantar left first metatarsal head and back of his right leg History of Present Illness (HPI) 04/21/18 ADMISSION This is a 50 year old man who is a type II diabetic. In spite of fact his hemoglobin A1c is actually quite good 5.73 months ago he is at a really difficult time over the last 6-7 months. He developed a rapidly progressive infection in the right foot in November 2018 associated with osteomyelitis and necrotizing fasciitis. 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 The stump required and a revision on 11/24/17. The stump revision was advertised as being secondary to falls. I'm not sure the progressive history here however this area is  actually closed over. The patient tells me over the same timeframe he has had wounds on the plantar  aspect of his left foot. In the ED saw Dr. Sharol Given in April of this year. 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 It is really not clear that this patient is been dressing this with anything. He came in with the clinic without any specific dressing on the wound areas using his own tennis shoe. He tells me that he only ambulates of course to do a pivot transfer. He does not have his prosthesis for the right leg as of yet and he blames Medicaid for this. He does however use the foot to push himself along in his wheelchair at home. The patient has not had formal arterial studies. ABI in our clinic on the left was 1.13. Patient's past medical history includes type 2 diabetes, fracture of the right fibula. I see that he was treated for abscesses on his right buttock and chest in 2016. I did not look at the microbiology of this. 04/28/18; patient comes back in the clinic today with the wound pretty much the same as when he came in here last week. Small opening lots of undermining relatively. He tells [place] that nothing is really been on this for 3 days in spite of the fact that we gave him enough to dress this easily especially such a small wound. He says he lives with his mother, she is not capable of assisting with this he is changing the dressings himself. 05/05/18; much better-looking wound today. Smaller. There is some undermining medially however that's only perhaps 2 mm. No surrounding erythema. We've been using silver collagen 05/12/18; small wound on the first metatarsal head. No undermining no surrounding erythema. We've been using silver collagen he has home health coming out to change 05/20/18 the wound on the first metatarsal head looks better. Covered in surface debris/callus nonviable tissue. Required debridement but post debridement this looks quite good. We've been using silver collagen. He has home health 05/27/18; first metatarsal head wound  continues to improve. Just about completely closed. Still a lot of surface debridement callus. We've been using silver collagen 06/06/18; the first metatarsal wound is completely healed over. There is still a lot of callus. I gently removed some of this just to make sure that there was no open area and there is not. The patient mentions to me that his left leg has felt like "lead" for about a week READMISSION 08/16/2020 This is a 51 year old man with type 2 diabetes. He returns to clinic today with a 1 month history of a blister and callus over the first metatarsal head. This is in exactly the same area as when he was here in 2019. 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 He has standard running shoes on the left foot to match the area in his prosthesis. He has only been covering this area with a Band-Aid has not really been specifically dressing this. ABI in our clinic on the left was 1.16. This is essentially stable from the value in 2019 10/1 the patient has a linear wound over the left first metatarsal head. Again not a lot of callus thick skin around this that I removed with a #3 curette. This is not go deep to bone or muscle it does not look to be infected. 10/8 first metatarsal head. The wound looks like it  is closing however this is going to be a difficult area to offload in the future. He has a size 15 shoe and he says his shoes are years old. The inserts in these current shoes are totally useless and I have advised him to replace these. He may need a new pair of shoes and I have he got original prosthesis at [facility] [place] I have asked him to go back there. 11/9; the patient has not been here in more than a month. Not sure he was healed when he was here the last time. I told him he would have to get new shoes and certainly offload the area over the left first metatarsal head. He has done nothing of the kind. He arrives back in clinic with  the same old new balance sneakers. A very large separating callus over the first metatarsal head on the left 11/16; he has purchased new shoes and has at least 2 insoles like I asked. The wound is measuring much smaller. He is using silver alginate he changes the dressing himself 11/30; wound is measurably smaller in length of about a half a centimeter. This is generally very little depth. We have been using silver alginate. He changes the dressing himself every second day. 12/14; wound is about the same size. Significant undermining medially relative to the size of the wound. We have been using silver alginate. There is not a way to offload this area as he walks with a prosthesis on the right leg 12/28; wound is about the same size. Again he has undermining medially. I am using polymen on this 12/02/2020; the wound looks smaller. Still a senescent edge. We have been using polymen 1/24; the wound is come down a few millimeters. Still a senescent edge. I have been using polymen 2/8; the wound is come down slightly. Still with slight undermining from 12-6 o'clock I have been using polymen partially to get offloading on this area which is opposed by his prosthesis on the other leg. 2/22 quite open improvement he still has a comma shaped wound on the left first metatarsal head. Some depth. Using polymen 3/14; he still has the same problem of a comma shaped area over the left first metatarsal head. This seems to have had more depth today at 0.6 cm. We have been using polymen. The patient changes this himself every second day. I explored the idea of a total contact cast on the left leg however this is his driving foot. He was not enamored with the idea of not being able to drive and states that he does not have anybody else to get him around 3/28; again he comes in with a smaller looking wound however there is depth and undermining requiring debridement. We have been using Hydrofera Blue, changed to  silver collagen today. The patient is doing the dressing himself 4/11; patient presents today for follow-up of his left diabetic foot wound. He denies any issues in the past 2 weeks. He is currently using silver collagen every other day with dressing changes. He does not use diabetic shoes or special inserts to his sneakers. He does not use felt pads for offloading. 4/19; patient presents for his 1 week follow-up. He denies any issues. He reports using Hydrofera Blue and has not been using silver collagen for dressing changes. He reports minimal drainage to the wound. He overall feels well. 5/3; patient presents for 2-week follow-up. He has been using silver collagen without issues. He has no complaints today  and states he overall feels well. 5/17; patient presents for 2-week follow-up. He states that the sleeve is rubbing up against the back of his right leg and causing a wound. He states this happened a week ago and has not been dressing the wound. The plantar wound is stable and he has been using collagen every other day. 5/24; Patient was had a recent hospitalization for cellulitis of his right popliteal fossa. He was admitted and started on IV antibiotics. He does not recall the plantar wound dressed while inpatient. He feels a lot better today. He was discharged on 5/21 on oral antibiotics and has not picked this up until today. He reports using collagen to the plantar wound since discharge. He is keeping the right posterior knee wound covered. He is getting a new sleeve for his prostatic on 5/26. He currently denies signs of infection. 6/1; patient presents for 1 week follow-up. He is still on doxycycline. He has not been dressing the back of the right knee wound. He states he is receiving his new prosthetic sleeve tomorrow. He states he is using silver alginate to the plantar foot wound. He currently denies signs of infection. He states he has not been offloading the left foot wound 6/7;  patient presents for 1 week follow-up. He completes his last dose of doxycycline today. He finally received his prosthetic sleeve for the right side and he states it feels much better. He has been using antibiotic ointment to the back of that right knee wound. He has been using silver alginate to the plantar foot wound. He currently denies signs of infection. 6/21; patient presents for 2-week follow-up. He reports no issues with his prosthetic sleeve. He has been using antibiotic ointment to the back of the right knee. He has been using silver alginate to the plantar foot wound. He has no issues or complaints today. He denies signs of infection. 7/5; patient presents for 2-week follow-up. He reports no issues or complaints today. He continues to use antibiotic ointment to the right posterior knee wound and silver alginate to the left plantar foot wound. He denies signs of infection. 7/19; patient presents for 2-week follow-up. He continues to use antibiotic ointment to the right posterior knee and silver alginate to the left plantar foot wound. He has no issues or complaints today. He denies signs of infection. 8/4; patient presents for 2-week follow-up. He has no issues or complaints today. He denies signs of infection. 8/18; patient presents for 2-week follow-up. He has no issues or concerns today. He has been approved for Apligraf and he would like to have this placed today. He denies signs of infection and reports that his right posterior knee wound is closed. 8/25; patient presents for 1 week follow-up. He had no issues with his first Apligraf placement last week. He did however develop an abscess to the back of his right knee. He reports tenderness to this area. He denies systemic signs of infection. 9/1; patient presents for 1 week follow-up. He has had no issues with his plantar foot wound. The Apligraf has stayed in place over the past week. A wound culture was done at last clinic visit and a  new antibiotic was sent to the pharmacy. Patient has not received this yet. He reports some mild tenderness to the back of his right knee. He denies systemic signs of infection. 9/8; patient presents for 1 week follow-up. He states he took Keflex and had diarrhea as a side effect. He was able to  complete the course. He reports continued tenderness to the right posterior knee wound. He has some drainage still. He denies increased warmth or erythema to the area. He denies fever/chills or nausea/vomiting. He has no issues or complaints today about his plantar foot wound. Patient History Information obtained from Patient. Family History Diabetes - Maternal Grandparents,Mother, Heart Disease - Father, Hypertension - Father, No family history of Cancer, Hereditary Spherocytosis, Kidney Disease, Lung Disease, Seizures, Stroke, Thyroid Problems, Tuberculosis. Social History Current every day smoker, Marital Status - Single, Alcohol Use - Never, Drug Use - No History, Caffeine Use - Never. Medical History Eyes Denies history of Cataracts, Glaucoma, Optic Neuritis Ear/Nose/Mouth/Throat Denies history of Chronic sinus problems/congestion, Middle ear problems Hematologic/Lymphatic Patient has history of Anemia Denies history of Hemophilia, Human Immunodeficiency Virus, Lymphedema, Sickle Cell Disease Respiratory Denies history of Aspiration, Asthma, Chronic Obstructive Pulmonary Disease (COPD), Pneumothorax, Sleep Apnea, Tuberculosis Cardiovascular Patient has history of Hypertension, Peripheral Venous Disease Denies history of Angina, Arrhythmia, Congestive Heart Failure, Coronary Artery Disease, Deep Vein Thrombosis, Hypotension, Myocardial Infarction, Peripheral Arterial Disease, Phlebitis, Vasculitis Gastrointestinal Denies history of Cirrhosis , Colitis, Crohnoos, Hepatitis A, Hepatitis B, Hepatitis C Endocrine Patient has history of Type II Diabetes - oral meds Denies history of Type I  Diabetes Genitourinary Denies history of End Stage Renal Disease Immunological Denies history of Lupus Erythematosus, Raynaudoos, Scleroderma Integumentary (Skin) Denies history of History of Burn Musculoskeletal Denies history of Gout, Rheumatoid Arthritis, Osteoarthritis, Osteomyelitis Neurologic Denies history of Dementia, Neuropathy, Quadriplegia, Paraplegia, Seizure Disorder Oncologic Denies history of Received Chemotherapy, Received Radiation Psychiatric Denies history of Anorexia/bulimia, Confinement Anxiety Hospitalization/Surgery History - fever 105, sepsis cellulitis right popliteal fossa 04/09/2021. Medical A Surgical History Notes [name] Constitutional Symptoms (General Health) falls , leukocytosis Respiratory During waking hours and catches himself not breathing, "gasp for air". Saw Dr Wynonia Lawman, he couldn't find a reason Objective Constitutional respirations regular, non-labored and within target range for patient.. Vitals Time Taken: 10:50 AM, Height: 77 in, Weight: 232 lbs, BMI: 27.5, Temperature: 98.7 F, Pulse: 99 bpm, Respiratory Rate: 17 breaths/min, Blood Pressure: 153/99 mmHg. Cardiovascular 2+ dorsalis pedis/posterior tibialis pulses. Psychiatric pleasant and cooperative. General Notes: Left first met head: Wound with granulation tissue present. No signs of infection. Posterior right knee: No opening noted on exam however there was yellow drainage on the dressing. Tenderness to palpation. No increased warmth or erythema to the area. Integumentary (Hair, Skin) Wound #2R status is Open. Original cause of wound was Blister. The date acquired was: 06/23/2020. The wound has been in treatment 49 weeks. The wound is located on the Left Metatarsal head first. The wound measures 0.2cm length x 0.2cm width x 0.3cm depth; 0.031cm^2 area and 0.009cm^3 volume. There is Fat Layer (Subcutaneous Tissue) exposed. There is no tunneling noted, however, there is undermining starting at  12:00 and ending at 12:00 with a maximum distance of 0.3cm. There is a small amount of serosanguineous drainage noted. The wound margin is well defined and not attached to the wound base. There is large (67-100%) red, pink granulation within the wound bed. There is no necrotic tissue within the wound bed. Wound #3R status is Open. Original cause of wound was Pressure Injury. The date acquired was: 03/31/2021. The wound has been in treatment 16 weeks. The wound is located on the Right,Posterior Knee. The wound measures 0cm length x 0cm width x 0cm depth; 0cm^2 area and 0cm^3 volume. There is a medium amount of purulent drainage noted. 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 There was callus and this was debrided. Apligraf was placed in standard fashion today. We will see if patient can get approval for more as he has only one left. I think he would greatly benefit from 5 more applications. As for his right knee wound I cannot appreciate any opening however he reports daily drainage and had drainage on the dressing today. I will continue antibiotics but switch it to augmentin since he had issues with Keflex. I would like to obtain an ultrasound to assess for any abscess as well. Procedures Wound #2R Pre-procedure diagnosis of Wound #2R is a Diabetic Wound/Ulcer of the Lower Extremity located on the Left Metatarsal head first .Severity of Tissue Pre Debridement is: Fat layer exposed. There was a Excisional Skin/Subcutaneous Tissue Debridement with a total area of 0.04 sq cm performed by Kalman Shan, DO. With the following instrument(s): Curette to  remove Viable and Non-Viable tissue/material. Material removed includes Callus, Subcutaneous Tissue, Skin: Dermis, and Skin: Epidermis after achieving pain control using Lidocaine. No specimens were taken. A time out was conducted at 11:04, prior to the start of the procedure. A Minimum amount of bleeding was controlled with Pressure. The procedure was tolerated well with a pain level of 0 throughout and a pain level of 0 following the procedure. Post Debridement Measurements: 0.2cm length x 0.2cm width x 0.3cm depth; 0.009cm^3 volume. Character of Wound/Ulcer Post Debridement is improved. Severity of Tissue Post Debridement is: Fat layer exposed. Post procedure Diagnosis Wound #2R: Same as Pre-Procedure Pre-procedure diagnosis of Wound #2R is a Diabetic Wound/Ulcer of the Lower Extremity located on the Left Metatarsal head first. A skin graft procedure using a bioengineered skin substitute/cellular or tissue based product was performed by Kalman Shan, DO with the following instrument(s): Forceps and Scissors. Apligraf was applied and secured with Steri-Strips. 11 sq cm of product was utilized and 33 sq cm was wasted due to wound size. Post Application, adaptic was applied. A Time Out was conducted at 11:11, prior to the start of the procedure. The procedure was tolerated well with a pain level of 0 throughout and a pain level of 0 following the procedure. Post procedure Diagnosis Wound #2R: Same as Pre-Procedure . Plan Follow-up Appointments: Return Appointment in 1 week. - Dr. Heber [place] Cellular or Tissue Based Products: Wound #2R Left Metatarsal head first: Cellular or Tissue Based Product Type: - Apligraf #4 Edema Control - Lymphedema / SCD / Other: Elevate legs to the level of the heart or above for 30 minutes daily and/or when sitting, a frequency of: Avoid standing for long periods of time. ordered were: ultrasound - STAT r/t possible abscess The following medication(s) was  prescribed: Augmentin oral 875 mg-125 mg tablet 1 1 tablet oral BID for 5 days starting 07/31/2021 WOUND #2R: - Metatarsal head first Wound Laterality: Left Cleanser: Soap and Water 1 x Per Day/15 Days Discharge Instructions: May shower and wash wound with dial antibacterial soap and water prior to dressing change. Cleanser: Wound Cleanser (Generic) 1 x Per Day/15 Days Discharge Instructions: Cleanse the wound with wound cleanser prior to applying a clean dressing using gauze sponges, not tissue or cotton balls. Peri-Wound Care: Sween Lotion (Moisturizing lotion) 1 x Per Day/15 Days  Discharge Instructions: Apply moisturizing lotion as directed Secondary Dressing: Woven Gauze Sponges 2x2 in (Generic) 1 x Per RAQ/76 Days Discharge Instructions: Apply over primary dressing as directed. Secondary Dressing: Optifoam Non-Adhesive Dressing, 4x4 in (Generic) 1 x Per Day/15 Days Discharge Instructions: Apply over primary dressing cut to make foam donut to help offload Secondary Dressing: ADAPTIC TOUCH 3x4.25 in 1 x Per Day/15 Days Discharge Instructions: Apply over skin sub Secured With: Conforming Stretch Gauze Bandage, Sterile 2x75 (in/in) (Generic) 1 x Per Day/15 Days Discharge Instructions: Secure with stretch gauze as directed. Secured With: 42M Medipore H Soft Cloth Surgical T ape, 2x2 (in/yd) (Generic) 1 x Per Day/15 Days Discharge Instructions: Secure dressing with tape as directed. 1. Apligraf #4 placed in standard fashion 2. Ultrasound of right posterior knee wound 3. Follow-up in 1 week 4. Continue antibiotics Electronic Signature(s) Signed: 07/31/2021 11:52:03 AM By: Kalman Shan DO Signed: 07/31/2021 11:52:03 AM By: Kalman Shan DO Entered By: Kalman Shan on 07/31/2021 11:51:09 -------------------------------------------------------------------------------- HxROS Details Patient Name: Date of Service: [name] [name], [name] 07/31/2021 10:45 A M Medical Record Number:  [id number] Patient Account Number: [id number] Date of Birth/Sex: Treating RN: [birth year] (50 y.o. [name] [name] Primary Care Provider: [name] Other Clinician: Referring Provider: Treating Provider/Extender: Delano Metz in Treatment: 26 Information Obtained From Patient Constitutional Symptoms (General Health) Medical History: Past Medical History Notes: falls , leukocytosis Eyes Medical History: Negative for: Cataracts; Glaucoma; Optic Neuritis Ear/Nose/Mouth/Throat Medical History: Negative for: Chronic sinus problems/congestion; Middle ear problems Hematologic/Lymphatic Medical History: Positive for: Anemia Negative for: Hemophilia; Human Immunodeficiency Virus; Lymphedema; Sickle Cell Disease Respiratory Medical History: Negative for: Aspiration; Asthma; Chronic Obstructive Pulmonary Disease (COPD); Pneumothorax; Sleep Apnea; Tuberculosis Past Medical History Notes: During waking hours and catches himself not breathing, "gasp for air". Saw Dr Wynonia Lawman, he couldn't find a reason Cardiovascular Medical History: Positive for: Hypertension; Peripheral Venous Disease Negative for: Angina; Arrhythmia; Congestive Heart Failure; Coronary Artery Disease; Deep Vein Thrombosis; Hypotension; Myocardial Infarction; Peripheral Arterial Disease; Phlebitis; Vasculitis Gastrointestinal Medical History: Negative for: Cirrhosis ; Colitis; Crohns; Hepatitis A; Hepatitis B; Hepatitis C Endocrine Medical History: Positive for: Type II Diabetes - oral meds Negative for: Type I Diabetes Time with diabetes: 2014 Treated with: Oral agents Blood sugar tested every day: Yes Tested : Blood sugar testing results: Breakfast: 91 Genitourinary Medical History: Negative for: End Stage Renal Disease Immunological Medical History: Negative for: Lupus Erythematosus; Raynauds; Scleroderma Integumentary (Skin) Medical History: Negative for: History of  Burn Musculoskeletal Medical History: Negative for: Gout; Rheumatoid Arthritis; Osteoarthritis; Osteomyelitis Neurologic Medical History: Negative for: Dementia; Neuropathy; Quadriplegia; Paraplegia; Seizure Disorder Oncologic Medical History: Negative for: Received Chemotherapy; Received Radiation Psychiatric Medical History: Negative for: Anorexia/bulimia; Confinement Anxiety Immunizations Pneumococcal Vaccine: Received Pneumococcal Vaccination: No Implantable Devices No devices added Hospitalization / Surgery History Type of Hospitalization/Surgery fever 105, sepsis cellulitis right popliteal fossa 04/09/2021 Family and Social History Cancer: No; Diabetes: Yes - Maternal Grandparents,Mother; Heart Disease: Yes - Father; Hereditary Spherocytosis: No; Hypertension: Yes - Father; Kidney Disease: No; Lung Disease: No; Seizures: No; Stroke: No; Thyroid Problems: No; Tuberculosis: No; Current every day smoker; Marital Status - Single; Alcohol Use: Never; Drug Use: No History; Caffeine Use: Never; Financial Concerns: No; Food, Clothing or Shelter Needs: No; Support System Lacking: No; Transportation Concerns: No Electronic Signature(s) Signed: 07/31/2021 11:52:03 AM By: Kalman Shan DO Signed: 07/31/2021 6:08:32 PM By: Rhae Hammock RN Entered By: Kalman Shan on 07/31/2021 11:34:37 -------------------------------------------------------------------------------- SuperBill Details Patient Name: Date of Service: [name] [name], [name]  [name] 07/31/2021 Medical Record Number: [id number] Patient Account Number: [id number] Date of Birth/Sex: Treating RN: [birth year] (50 y.o. [name], [name] Primary Care Provider: [name] Other Clinician: Referring Provider: Treating Provider/Extender: Delano Metz in Treatment: 70 Diagnosis Coding ICD-10 Codes Code Description [phone] Non-pressure chronic ulcer of other part of left foot with fat layer  exposed E11.621 Type 2 diabetes mellitus with foot ulcer S81.801D Unspecified open wound, right lower leg, subsequent encounter E11.42 Type 2 diabetes mellitus with diabetic polyneuropathy Z89.511 Acquired absence of right leg below knee Facility Procedures CPT4 Code: [id number] Description: (Facility Use Only) Apligraf 1 SQ CM Modifier: Quantity: 49 CPT4 Code: 42103128 Description: 11886 - SKIN SUB GRAFT FACE/NK/HF/G ICD-10 Diagnosis Description L97.522 Non-pressure chronic ulcer of 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

## 2021-07-31 NOTE — Progress Notes (Signed)
KINGSTEN, ENFIELD ([id number]) Visit Report for 07/31/2021 Arrival Information Details Patient Name: Date of Service: [name] [name] 07/31/2021 10:45 A M Medical Record Number: [id number] Patient Account Number: [id number] Date of Birth/Sex: Treating RN: 01-Mar-1971 (50 y.o. [name], [name] Primary Care Carliss Quast: [name] Other Clinician: Referring Evey Mcmahan: Treating Chris Cripps/Extender: Delano Metz in Treatment: 52 Visit Information History Since Last Visit Added or deleted any medications: No Patient Arrived: Ambulatory Any new allergies or adverse reactions: No Arrival Time: 10:49 Had a fall or experienced change in No Accompanied By: self activities of daily living that may affect Transfer Assistance: None risk of falls: Patient Identification Verified: Yes Signs or symptoms of abuse/neglect since last visito No Secondary Verification Process Completed: Yes Hospitalized since last visit: No Patient Requires Transmission-Based Precautions: No Implantable device outside of the clinic excluding No Patient Has Alerts: No cellular tissue based products placed in the center since last visit: Has Dressing in Place as Prescribed: Yes Pain Present Now: Yes Electronic Signature(s) Signed: 07/31/2021 6:08:32 PM By: Rhae Hammock RN Entered By: Rhae Hammock on 07/31/2021 10:50:27 -------------------------------------------------------------------------------- Encounter Discharge Information Details Patient Name: Date of Service: [name] [name], [name] 07/31/2021 10:45 A M Medical Record Number: [id number] Patient Account Number: [id number] Date of Birth/Sex: Treating RN: Aug 30, 1971 (50 y.o. [name], [name] Primary Care Reathel Turi: [name] Other Clinician: Referring Jalasia Eskridge: Treating Elward Nocera/Extender: Delano Metz in Treatment: 74 Encounter Discharge Information Items Post Procedure  Vitals Discharge Condition: Stable Temperature (F): 98.7 Ambulatory Status: Ambulatory Pulse (bpm): 74 Discharge Destination: Home Respiratory Rate (breaths/min): 17 Transportation: Private Auto Blood Pressure (mmHg): 143/73 Accompanied By: self Schedule Follow-up Appointment: Yes Clinical Summary of Care: Patient Declined Electronic Signature(s) Signed: 07/31/2021 6:08:32 PM By: Rhae Hammock RN Entered By: Rhae Hammock on 07/31/2021 11:25:31 -------------------------------------------------------------------------------- Lower Extremity Assessment Details Patient Name: Date of Service: [name] [name], [name] 07/31/2021 10:45 A M Medical Record Number: [id number] Patient Account Number: [id number] Date of Birth/Sex: Treating RN: 06-15-1971 (50 y.o. [name], [name] Primary Care Ileene Allie: [name] Other Clinician: Referring Hadassa Cermak: Treating Jung Yurchak/Extender: Delano Metz in Treatment: 49 Edema Assessment Assessed: [name]: Yes] [name]: No] Edema: [Left: Ye] [Right: s] Calf Left: Right: Point of Measurement: 50 cm From Medial Instep 38.4 cm Ankle Left: Right: Point of Measurement: 11 cm From Medial Instep 26.5 cm Vascular Assessment Pulses: Dorsalis Pedis Palpable: [Left:Yes] Posterior Tibial Palpable: [Left:Yes] Electronic Signature(s) Signed: 07/31/2021 6:08:32 PM By: Rhae Hammock RN Entered By: Rhae Hammock on 07/31/2021 10:52:47 -------------------------------------------------------------------------------- Multi Wound Chart Details Patient Name: Date of Service: [name] [name], [name] 07/31/2021 10:45 A M Medical Record Number: [id number] Patient Account Number: [id number] Date of Birth/Sex: Treating RN: [birth year] (50 y.o. [name], [name] Primary Care Lively Haberman: [name] Other Clinician: Referring Nakema Fake: Treating Viney Acocella/Extender: Delano Metz in Treatment:  32 Vital Signs Height(in): 77 Pulse(bpm): 99 Weight(lbs): 232 Blood Pressure(mmHg): 153/99 Body Mass Index(BMI): 28 Temperature(F): 98.7 Respiratory Rate(breaths/min): 17 Photos: [2R:No Photos Left Metatarsal head first] [3R:No Photos Right, Posterior Knee] [N/A:N/A N/A] Wound Location: [2R:Blister] [3R:Pressure Injury] [N/A:N/A] Wounding Event: [2R:Diabetic Wound/Ulcer of the Lower] [3R:Pressure Ulcer] [N/A:N/A] Primary Etiology: [2R:Extremity Anemia, Hypertension, Peripheral] [3R:N/A] [N/A:N/A] Comorbid History: [2R:Venous Disease, Type II Diabetes 06/23/2020] [3R:03/31/2021] [N/A:N/A] Date Acquired: [2R:49] [3R:16] [N/A:N/A] Weeks of Treatment: [2R:Open] [3R:Open] [N/A:N/A] Wound Status: [2R:Yes] [3R:Yes] [N/A:N/A] Wound Recurrence: [2R:No] [3R:Yes] [N/A:N/A] Clustered Wound: [2R:0.2x0.2x0.3] [3R:0x0x0] [N/A:N/A] Measurements L x W x D (cm) [2R:0.031] [3R:0] [N/A:N/A] A (cm) :  rea [2R:0.009] [3R:0] [N/A:N/A] Volume (cm) : [2R:0.00%] [3R:100.00%] [N/A:N/A] % Reduction in A rea: [2R:-200.00%] [3R:100.00%] [N/A:N/A] % Reduction in Volume: [2R:12] Starting Position 1 (o'clock): [2R:12] Ending Position 1 (o'clock): [2R:0.3] Maximum Distance 1 (cm): [2R:Yes] [3R:N/A] [N/A:N/A] Undermining: [2R:Grade 2] [3R:Category/Stage II] [N/A:N/A] Classification: [2R:Small] [3R:Medium] [N/A:N/A] Exudate A mount: [2R:Serosanguineous] [3R:Purulent] [N/A:N/A] Exudate Type: [2R:red, brown] [3R:yellow, brown, green] [N/A:N/A] Exudate Color: [2R:Well defined, not attached] [3R:N/A] [N/A:N/A] Wound Margin: [2R:Large (67-100%)] [3R:N/A] [N/A:N/A] Granulation A mount: [2R:Red, Pink] [3R:N/A] [N/A:N/A] Granulation Quality: [2R:None Present (0%)] [3R:N/A] [N/A:N/A] Necrotic A mount: [2R:Fat Layer (Subcutaneous Tissue): Yes N/A] [N/A:N/A] Exposed Structures: [2R:Fascia: No Tendon: No Muscle: No Joint: No Bone: No None] [3R:N/A] [N/A:N/A] Epithelialization: [2R:Debridement - Excisional] [3R:N/A]  [N/A:N/A] Debridement: Pre-procedure Verification/Time Out 11:04 [3R:N/A] [N/A:N/A] Taken: [2R:Lidocaine] [3R:N/A] [N/A:N/A] Pain Control: [2R:Callus, Subcutaneous] [3R:N/A] [N/A:N/A] Tissue Debrided: [2R:Skin/Subcutaneous Tissue] [3R:N/A] [N/A:N/A] Level: [2R:0.04] [3R:N/A] [N/A:N/A] Debridement A (sq cm): [2R:rea Curette] [3R:N/A] [N/A:N/A] Instrument: [2R:Minimum] [3R:N/A] [N/A:N/A] Bleeding: [2R:Pressure] [3R:N/A] [N/A:N/A] Hemostasis A chieved: [2R:0] [3R:N/A] [N/A:N/A] Procedural Pain: [2R:0] [3R:N/A] [N/A:N/A] Post Procedural Pain: [2R:Procedure was tolerated well] [3R:N/A] [N/A:N/A] Debridement Treatment Response: [2R:0.2x0.2x0.3] [3R:N/A] [N/A:N/A] Post Debridement Measurements L x W x D (cm) [2R:0.009] [3R:N/A] [N/A:N/A] Post Debridement Volume: (cm) [2R:Cellular or Tissue Based Product] [3R:N/A] [N/A:N/A] Procedures Performed: [2R:Debridement] Treatment Notes Wound #2R (Metatarsal head first) Wound Laterality: Left Cleanser Soap and Water Discharge Instruction: May shower and wash wound with dial antibacterial soap and water prior to dressing change. Wound Cleanser Discharge Instruction: Cleanse the wound with wound cleanser prior to applying a clean dressing using gauze sponges, not tissue or cotton balls. Peri-Wound Care Sween Lotion (Moisturizing lotion) Discharge Instruction: Apply moisturizing lotion as directed Topical Primary Dressing Secondary Dressing Woven Gauze Sponges 2x2 in Discharge Instruction: Apply over primary dressing as directed. Optifoam Non-Adhesive Dressing, 4x4 in Discharge Instruction: Apply over primary dressing cut to make foam donut to help offload ADAPTIC TOUCH 3x4.25 in Discharge Instruction: Apply over skin sub Secured With Conforming Stretch Gauze Bandage, Sterile 2x75 (in/in) Discharge Instruction: Secure with stretch gauze as directed. 48M Medipore H Soft Cloth Surgical T ape, 2x2 (in/yd) Discharge Instruction: Secure dressing with  tape as directed. Compression Wrap Compression Stockings Add-Ons Wound #3R (Knee) Wound Laterality: Right, Posterior Cleanser Peri-Wound Care Topical Primary Dressing Secondary Dressing Secured With Compression Wrap Compression Stockings Add-Ons Electronic Signature(s) Signed: 07/31/2021 11:52:03 AM By: Kalman Shan DO Signed: 07/31/2021 6:08:32 PM By: Rhae Hammock RN Entered By: Kalman Shan on 07/31/2021 11:33:18 -------------------------------------------------------------------------------- Multi-Disciplinary Care Plan Details Patient Name: Date of Service: [name] [name], [name] 07/31/2021 10:45 A M Medical Record Number: [id number] Patient Account Number: [id number] Date of Birth/Sex: Treating RN: [birth year] (50 y.o. [name], [name] Primary Care Estanislado Surgeon: [name] Other Clinician: Referring Azya Barbero: Treating Sunni Richardson/Extender: Delano Metz in Treatment: [place] reviewed with physician Active Inactive Wound/Skin Impairment Nursing Diagnoses: Impaired tissue integrity Goals: Patient/caregiver will verbalize understanding of skin care regimen Date Initiated: 11/19/2020 Target Resolution Date: 08/21/2021 Goal Status: Active Ulcer/skin breakdown will have a volume reduction of 30% by week 4 Date Initiated: 08/16/2020 Date Inactivated: 03/03/2021 Target Resolution Date: 03/10/2021 Goal Status: Met Interventions: Provide education on ulcer and skin care Notes: 07/24/21: Wound care regimen ongoing. Electronic Signature(s) Signed: 07/31/2021 6:08:32 PM By: Rhae Hammock RN Entered By: Rhae Hammock on 07/31/2021 11:00:53 -------------------------------------------------------------------------------- Pain Assessment Details Patient Name: Date of Service: [name] [name], [place] L. 07/31/2021 10:45 A M Medical Record Number: [id number] Patient Account Number: [id number] Date of Birth/Sex: Treating  RN: 1971-06-17 (50 y.o. [name], [name] Primary Care Tremaine Earwood: [name] Other Clinician: Referring Aletta Edmunds: Treating Deah Ottaway/Extender: Delano Metz in Treatment: 20 Active Problems Location of Pain Severity and Description of Pain Patient Has Paino Yes Site Locations Pain Location: Pain in Ulcers With Dressing Change: Yes Duration of the Pain. Constant / Intermittento Intermittent Rate the pain. Current Pain Level: 6 Worst Pain Level: 10 Least Pain Level: 0 Tolerable Pain Level: 6 Character of Pain Describe the Pain: Aching Pain Management and Medication Current Pain Management: Medication: No Cold Application: No Rest: No Massage: No Activity: No T.E.N.S.: No Heat Application: No Leg drop or elevation: No Is the Current Pain Management Adequate: Adequate How does your wound impact your activities of daily livingo Sleep: No Bathing: No Appetite: No Relationship With Others: No Bladder Continence: No Emotions: No Bowel Continence: No Work: No Toileting: No Drive: No Dressing: No Hobbies: No Electronic Signature(s) Signed: 07/31/2021 6:08:32 PM By: Rhae Hammock RN Entered By: Rhae Hammock on 07/31/2021 10:52:35 -------------------------------------------------------------------------------- Patient/Caregiver Education Details Patient Name: Date of Service: [name] [name] 9/8/2022andnbsp10:45 A M Medical Record Number: [id number] Patient Account Number: [id number] Date of Birth/Gender: Treating RN: 1971-07-17 (50 y.o. [name] Primary Care Physician: [name] Other Clinician: Referring Physician: Treating Physician/Extender: Delano Metz in Treatment: 61 Education Assessment Education Provided To: Patient Education Topics Provided Wound/Skin Impairment: Methods: Explain/Verbal Responses: Reinforcements needed, State content correctly [employer]) Signed: 07/31/2021 6:08:32 PM By: Rhae Hammock RN Entered By: Rhae Hammock on 07/31/2021 11:01:10 -------------------------------------------------------------------------------- Wound Assessment Details Patient Name: Date of Service: [name] [name], [name] 07/31/2021 10:45 A M Medical Record Number: [id number] Patient Account Number: [id number] Date of Birth/Sex: Treating RN: [birth year] (50 y.o. [name], [name] Primary Care Shaliyah Taite: [name] Other Clinician: Referring Tykel Badie: Treating Urie Loughner/Extender: Delano Metz in Treatment: 20 Wound Status Wound Number: 2R Primary Diabetic Wound/Ulcer of the Lower Extremity Etiology: Wound Location: Left Metatarsal head first Wound Status: Open Wounding Event: Blister Comorbid Anemia, Hypertension, Peripheral Venous Disease, Type II Date Acquired: 06/23/2020 History: Diabetes Weeks Of Treatment: 49 Clustered Wound: No Wound Measurements Length: (cm) 0.2 Width: (cm) 0.2 Depth: (cm) 0.3 Area: (cm) 0.031 Volume: (cm) 0.009 % Reduction in Area: 0% % Reduction in Volume: -200% Epithelialization: None Tunneling: No Undermining: Yes Starting Position (o'clock): 12 Ending Position (o'clock): 12 Maximum Distance: (cm) 0.3 Wound Description Classification: Grade 2 Wound Margin: Well defined, not attached Exudate Amount: Small Exudate Type: Serosanguineous Exudate Color: red, brown Foul Odor After Cleansing: No Slough/Fibrino No Wound Bed Granulation Amount: Large (67-100%) Exposed Structure Granulation Quality: Red, Pink Fascia Exposed: No Necrotic Amount: None Present (0%) Fat Layer (Subcutaneous Tissue) Exposed: Yes Tendon Exposed: No Muscle Exposed: No Joint Exposed: No Bone Exposed: No Treatment Notes Wound #2R (Metatarsal head first) Wound Laterality: Left Cleanser Soap and Water Discharge Instruction: May shower and wash wound with dial antibacterial soap and  water prior to dressing change. Wound Cleanser Discharge Instruction: Cleanse the wound with wound cleanser prior to applying a clean dressing using gauze sponges, not tissue or cotton balls. Peri-Wound Care Sween Lotion (Moisturizing lotion) Discharge Instruction: Apply moisturizing lotion as directed Topical Primary Dressing Secondary Dressing Woven Gauze Sponges 2x2 in Discharge Instruction: Apply over primary dressing as directed. Optifoam Non-Adhesive Dressing, 4x4 in Discharge Instruction: Apply over primary dressing cut to make foam donut to help offload ADAPTIC TOUCH 3x4.25 in Discharge Instruction: Apply over skin sub Secured With Conforming Stretch Gauze  Bandage, Sterile 2x75 (in/in) Discharge Instruction: Secure with stretch gauze as directed. 67M Medipore H Soft Cloth Surgical T ape, 2x2 (in/yd) Discharge Instruction: Secure dressing with tape as directed. Compression Wrap Compression Stockings Add-Ons Electronic Signature(s) Signed: 07/31/2021 6:08:32 PM By: Rhae Hammock RN Entered By: Rhae Hammock on 07/31/2021 10:58:07 -------------------------------------------------------------------------------- Wound Assessment Details Patient Name: Date of Service: [name] [name], [name] 07/31/2021 10:45 A M Medical Record Number: [id number] Patient Account Number: [id number] Date of Birth/Sex: Treating RN: 14-Mar-1971 (50 y.o. [name], [name] Primary Care Samier Jaco: [name] Other Clinician: Referring Simone Rodenbeck: Treating Jaydon Avina/Extender: Delano Metz in Treatment: 63 Wound Status Wound Number: 3R Primary Etiology: Pressure Ulcer Wound Location: Right, Posterior Knee Wound Status: Open Wounding Event: Pressure Injury Date Acquired: 03/31/2021 Weeks Of Treatment: 16 Clustered Wound: Yes Wound Measurements Length: (cm) Width: (cm) Depth: (cm) Area: (cm) Volume: (cm) Wound Description Classification: Category/Stage  II Exudate Amount: Medium Exudate Type: Purulent Exudate Color: yellow, brown, green 0 % Reduction in Area: 100% 0 % Reduction in Volume: 100% 0 0 0 Electronic Signature(s) Signed: 07/31/2021 6:08:32 PM By: Rhae Hammock RN Entered By: Rhae Hammock on 07/31/2021 10:58:16 -------------------------------------------------------------------------------- Vitals Details Patient Name: Date of Service: [name] [name], [name] 07/31/2021 10:45 A M Medical Record Number: [id number] Patient Account Number: [id number] Date of Birth/Sex: Treating RN: 1971/01/27 (50 y.o. [name], [name] Primary Care Yousuf Ager: [name] Other Clinician: Referring Devynne Sturdivant: Treating Joshaua Epple/Extender: Delano Metz in Treatment: 1 Vital Signs Time Taken: 10:50 Temperature (F): 98.7 Height (in): 77 Pulse (bpm): 99 Weight (lbs): 232 Respiratory Rate (breaths/min): 17 Body Mass Index (BMI): 27.5 Blood Pressure (mmHg): 153/99 Reference Range: 80 - 120 mg / dl Electronic Signature(s) Signed: 07/31/2021 6:08:32 PM By: Rhae Hammock RN Entered By: Rhae Hammock on 07/31/2021 10:52:15

## 2021-08-01 ENCOUNTER — Ambulatory Visit (HOSPITAL_COMMUNITY)
Admission: RE | Admit: 2021-08-01 | Discharge: 2021-08-01 | Disposition: A | Discharge status: Home / Self Care | Payer: Medicare Other | Source: Ambulatory Visit | Attending: Internal Medicine | Admitting: Internal Medicine

## 2021-08-01 DIAGNOSIS — L0291 Cutaneous abscess, unspecified: Secondary | ICD-10-CM | POA: Diagnosis present

## 2021-08-06 ENCOUNTER — Encounter (HOSPITAL_COMMUNITY)
Admission: RE | Admit: 2021-08-06 | Discharge: 2021-08-06 | Disposition: A | Discharge status: Home / Self Care | Payer: Medicare Other | Source: Ambulatory Visit | Attending: Nephrology | Admitting: Nephrology

## 2021-08-06 VITALS — BP 135/81 | HR 96 | Temp 98.2°F

## 2021-08-06 DIAGNOSIS — D62 Acute posthemorrhagic anemia: Secondary | ICD-10-CM | POA: Insufficient documentation

## 2021-08-06 DIAGNOSIS — N179 Acute kidney failure, unspecified: Secondary | ICD-10-CM | POA: Diagnosis present

## 2021-08-06 DIAGNOSIS — D649 Anemia, unspecified: Secondary | ICD-10-CM | POA: Insufficient documentation

## 2021-08-06 LAB — IRON AND TIBC
Iron: 65 ug/dL (ref 45–182)
Saturation Ratios: 22 % (ref 17.9–39.5)
TIBC: 294 ug/dL (ref 250–450)
UIBC: 229 ug/dL

## 2021-08-06 LAB — POCT HEMOGLOBIN-HEMACUE: Hemoglobin: 11.3 g/dL — ABNORMAL LOW (ref 13.0–17.0)

## 2021-08-06 LAB — FERRITIN: Ferritin: 15 ng/mL — ABNORMAL LOW (ref 24–336)

## 2021-08-06 MED ORDER — EPOETIN ALFA-EPBX 10000 UNIT/ML IJ SOLN
10000.0000 [IU] | INTRAMUSCULAR | Status: DC
  Administered 2021-08-06: 10000 [IU] via SUBCUTANEOUS

## 2021-08-06 MED ORDER — EPOETIN ALFA-EPBX 10000 UNIT/ML IJ SOLN
INTRAMUSCULAR | Status: AC
  Filled 2021-08-06: qty 1

## 2021-08-07 ENCOUNTER — Encounter (HOSPITAL_BASED_OUTPATIENT_CLINIC_OR_DEPARTMENT_OTHER): Payer: Medicare Other | Admitting: Internal Medicine

## 2021-08-07 ENCOUNTER — Other Ambulatory Visit: Payer: Self-pay

## 2021-08-07 DIAGNOSIS — L97522 Non-pressure chronic ulcer of other part of left foot with fat layer exposed: Secondary | ICD-10-CM

## 2021-08-07 DIAGNOSIS — E11621 Type 2 diabetes mellitus with foot ulcer: Secondary | ICD-10-CM | POA: Diagnosis not present

## 2021-08-07 NOTE — Progress Notes (Signed)
[name] [name] ([id number]) Visit Report for 08/07/2021 Chief Complaint Document Details Patient Name: Date of Service: [name] [name] 08/07/2021 10:45 A M Medical Record Number: [id number] Patient Account Number: [id number] Date of Birth/Sex: Treating RN: Dec 10, 1970 (50 y.o. [name] [name], [name] Primary Care Provider: [name] Other Clinician: Referring Provider: Treating Provider/Extender: Delano Metz in Treatment: 60 Information Obtained from: Patient Chief Complaint patient is here for a review of the wound on his plantar left first metatarsal head and back of his right leg Electronic Signature(s) Signed: 08/07/2021 12:58:50 PM By: Kalman Shan DO Entered By: Kalman Shan on 08/07/2021 12:15:58 -------------------------------------------------------------------------------- Cellular or Tissue Based Product Details Patient Name: Date of Service: [name] [name], [name] 08/07/2021 10:45 A M Medical Record Number: [id number] Patient Account Number: [id number] Date of Birth/Sex: Treating RN: 1971/01/18 (50 y.o. [name] [name] Primary Care Provider: [name] Other Clinician: Referring Provider: Treating Provider/Extender: Delano Metz in Treatment: 43 Cellular or Tissue Based Product Type Wound #2R Left Metatarsal head first Applied to: Performed By: Physician Kalman Shan, DO Cellular or Tissue Based Product Type: Apligraf Level of Consciousness (Pre-procedure): Awake and Alert Pre-procedure Verification/Time Out Yes - 11:35 Taken: Location: genitalia / hands / feet / multiple digits Wound Size (sq cm): 0.04 Product Size (sq cm): 44 Waste Size (sq cm): 40 Waste Reason: wound and product size Amount of Product Applied (sq cm): 4 Instrument Used: Blade, Forceps, Scissors Lot #: [id number] Expiration Date: 08/09/2021 Fenestrated: Yes Instrument: Blade Reconstituted: Yes Solution  Type: Saline Solution Amount: 71m Lot #: [id number] Expiration Date: 07/24/2022 Secured: Yes Secured With: Steri-Strips Dressing Applied: Yes Primary Dressing: Adaptic Response to Treatment: Procedure was tolerated well Level of Consciousness (Post- Awake and Alert procedure): Post Procedure Diagnosis Same as Pre-procedure Electronic Signature(s) Signed: 08/07/2021 12:58:50 PM By: HKalman ShanDO Signed: 08/07/2021 5:57:57 PM By: BLorrin JacksonEntered By: BLorrin Jacksonon 08/07/2021 11:48:16 -------------------------------------------------------------------------------- Debridement Details Patient Name: Date of Service: [name], [name] 08/07/2021 10:45 A M Medical Record Number: [id number] Account Number: [id number] of Birth/Sex: Treating RN: [birth year](50y.o. MMarcheta GrammesPrimary Care Provider: BFerd HibbsOther Clinician: Referring Provider: Treating Provider/Extender: HDelano Metzin Treatment: 50 Debridement Performed for Assessment: Wound #2R Left Metatarsal head first Performed By: Physician HKalman Shan DO Debridement Type: Debridement Severity of Tissue Pre Debridement: Fat layer exposed Level of Consciousness (Pre-procedure): Awake and Alert Pre-procedure Verification/Time Out Yes - 11:35 Taken: Start Time: 11:36 T Area Debrided (L x W): otal 0.2 (cm) x 0.2 (cm) = 0.04 (cm) Tissue and other material debrided: Non-Viable, Subcutaneous Level: Skin/Subcutaneous Tissue Debridement Description: Excisional Instrument: Curette Bleeding: Minimum Hemostasis Achieved: Pressure End Time: 11:40 Response to Treatment: Procedure was tolerated well Level of Consciousness (Post- Awake and Alert procedure): Post Debridement Measurements of Total Wound Length: (cm) 0.2 Width: (cm) 0.2 Depth: (cm) 0.2 Volume: (cm) 0.006 Character of Wound/Ulcer Post Debridement: Stable Severity of Tissue Post  Debridement: Fat layer exposed Post Procedure Diagnosis Same as Pre-procedure Electronic Signature(s) Signed: 08/07/2021 12:58:50 PM By: HKalman ShanDO Signed: 08/07/2021 5:57:57 PM By: BLorrin JacksonEntered By: BLorrin Jacksonon 08/07/2021 11:43:28 -------------------------------------------------------------------------------- HPI Details Patient Name: Date of Service: [name] [name], [name] 08/07/2021 10:45 A M Medical Record Number: [id number] Account Number: [id number] of Birth/Sex: Treating RN: [birth year](50 y.o. MErie NoePrimary Care Provider: Other Clinician: BFerd HibbsReferring Provider: Treating Provider/Extender: HDelano Metz  in Treatment: 50 History of Present Illness HPI Description: 04/21/18 ADMISSION This is a 50 year old man who is a type II diabetic. In spite of fact his hemoglobin A1c is actually quite good 5.73 months ago he is at a really difficult time over the last 6-7 months. He developed a rapidly progressive infection in the right foot in November 2018 associated with osteomyelitis and necrotizing fasciitis. 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 The stump required and a revision on 11/24/17. The stump revision was advertised as being secondary to falls. I'm not sure the progressive history here however this area is actually closed over. The patient tells me over the same timeframe he has had wounds on the plantar aspect of his left foot. In the ED saw Dr. Sharol Given in April of this year. 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 It is really not clear that this patient is been dressing this with anything. He came in with the clinic without any specific dressing on the wound areas using his own tennis shoe. He tells me that he only ambulates of course to do a pivot transfer. He does not have his prosthesis for the right leg as of yet and he blames Medicaid for  this. He does however use the foot to push himself along in his wheelchair at home. The patient has not had formal arterial studies. ABI in our clinic on the left was 1.13. Patient's past medical history includes type 2 diabetes, fracture of the right fibula. I see that he was treated for abscesses on his right buttock and chest in 2016. I did not look at the microbiology of this. 04/28/18; patient comes back in the clinic today with the wound pretty much the same as when he came in here last week. Small opening lots of undermining relatively. He tells [place] that nothing is really been on this for 3 days in spite of the fact that we gave him enough to dress this easily especially such a small wound. He says he lives with his mother, she is not capable of assisting with this he is changing the dressings himself. 05/05/18; much better-looking wound today. Smaller. There is some undermining medially however that's only perhaps 2 mm. No surrounding erythema. We've been using silver collagen 05/12/18; small wound on the first metatarsal head. No undermining no surrounding erythema. We've been using silver collagen he has home health coming out to change 05/20/18 the wound on the first metatarsal head looks better. Covered in surface debris/callus nonviable tissue. Required debridement but post debridement this looks quite good. We've been using silver collagen. He has home health 05/27/18; first metatarsal head wound continues to improve. Just about completely closed. Still a lot of surface debridement callus. We've been using silver collagen 06/06/18; the first metatarsal wound is completely healed over. There is still a lot of callus. I gently removed some of this just to make sure that there was no open area and there is not. The patient mentions to me that his left leg has felt like "lead" for about a week READMISSION 08/16/2020 This is a 50 year old man with type 2 diabetes. He returns to clinic today with a  1 month history of a blister and callus over the first metatarsal head. This is in exactly the same area as when he was here in 2019. 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 He  has standard running shoes on the left foot to match the area in his prosthesis. He has only been covering this area with a Band-Aid has not really been specifically dressing this. ABI in our clinic on the left was 1.16. This is essentially stable from the value in 2019 10/1 the patient has a linear wound over the left first metatarsal head. Again not a lot of callus thick skin around this that I removed with a #3 curette. This is not go deep to bone or muscle it does not look to be infected. 10/8 first metatarsal head. The wound looks like it is closing however this is going to be a difficult area to offload in the future. He has a size 15 shoe and he says his shoes are years old. The inserts in these current shoes are totally useless and I have advised him to replace these. He may need a new pair of shoes and I have he got original prosthesis at [facility] [place] I have asked him to go back there. 11/9; the patient has not been here in more than a month. Not sure he was healed when he was here the last time. I told him he would have to get new shoes and certainly offload the area over the left first metatarsal head. He has done nothing of the kind. He arrives back in clinic with the same old new balance sneakers. A very large separating callus over the first metatarsal head on the left 11/16; he has purchased new shoes and has at least 2 insoles like I asked. The wound is measuring much smaller. He is using silver alginate he changes the dressing himself 11/30; wound is measurably smaller in length of about a half a centimeter. This is generally very little depth. We have been using silver alginate. He changes the dressing himself every second day. 12/14; wound is about  the same size. Significant undermining medially relative to the size of the wound. We have been using silver alginate. There is not a way to offload this area as he walks with a prosthesis on the right leg 12/28; wound is about the same size. Again he has undermining medially. I am using polymen on this 12/02/2020; the wound looks smaller. Still a senescent edge. We have been using polymen 1/24; the wound is come down a few millimeters. Still a senescent edge. I have been using polymen 2/8; the wound is come down slightly. Still with slight undermining from 12-6 o'clock I have been using polymen partially to get offloading on this area which is opposed by his prosthesis on the other leg. 2/22 quite open improvement he still has a comma shaped wound on the left first metatarsal head. Some depth. Using polymen 3/14; he still has the same problem of a comma shaped area over the left first metatarsal head. This seems to have had more depth today at 0.6 cm. We have been using polymen. The patient changes this himself every second day. I explored the idea of a total contact cast on the left leg however this is his driving foot. He was not enamored with the idea of not being able to drive and states that he does not have anybody else to get him around 3/28; again he comes in with a smaller looking wound however there is depth and undermining requiring debridement. We have been using Hydrofera Blue, changed to silver collagen today. The patient is doing the dressing himself 4/11; patient presents today for follow-up  of his left diabetic foot wound. He denies any issues in the past 2 weeks. He is currently using silver collagen every other day with dressing changes. He does not use diabetic shoes or special inserts to his sneakers. He does not use felt pads for offloading. 4/19; patient presents for his 1 week follow-up. He denies any issues. He reports using Hydrofera Blue and has not been using silver  collagen for dressing changes. He reports minimal drainage to the wound. He overall feels well. 5/3; patient presents for 2-week follow-up. He has been using silver collagen without issues. He has no complaints today and states he overall feels well. 5/17; patient presents for 2-week follow-up. He states that the sleeve is rubbing up against the back of his right leg and causing a wound. He states this happened a week ago and has not been dressing the wound. The plantar wound is stable and he has been using collagen every other day. 5/24; Patient was had a recent hospitalization for cellulitis of his right popliteal fossa. He was admitted and started on IV antibiotics. He does not recall the plantar wound dressed while inpatient. He feels a lot better today. He was discharged on 5/21 on oral antibiotics and has not picked this up until today. He reports using collagen to the plantar wound since discharge. He is keeping the right posterior knee wound covered. He is getting a new sleeve for his prostatic on 5/26. He currently denies signs of infection. 6/1; patient presents for 1 week follow-up. He is still on doxycycline. He has not been dressing the back of the right knee wound. He states he is receiving his new prosthetic sleeve tomorrow. He states he is using silver alginate to the plantar foot wound. He currently denies signs of infection. He states he has not been offloading the left foot wound 6/7; patient presents for 1 week follow-up. He completes his last dose of doxycycline today. He finally received his prosthetic sleeve for the right side and he states it feels much better. He has been using antibiotic ointment to the back of that right knee wound. He has been using silver alginate to the plantar foot wound. He currently denies signs of infection. 6/21; patient presents for 2-week follow-up. He reports no issues with his prosthetic sleeve. He has been using antibiotic ointment to the back  of the right knee. He has been using silver alginate to the plantar foot wound. He has no issues or complaints today. He denies signs of infection. 7/5; patient presents for 2-week follow-up. He reports no issues or complaints today. He continues to use antibiotic ointment to the right posterior knee wound and silver alginate to the left plantar foot wound. He denies signs of infection. 7/19; patient presents for 2-week follow-up. He continues to use antibiotic ointment to the right posterior knee and silver alginate to the left plantar foot wound. He has no issues or complaints today. He denies signs of infection. 8/4; patient presents for 2-week follow-up. He has no issues or complaints today. He denies signs of infection. 8/18; patient presents for 2-week follow-up. He has no issues or concerns today. He has been approved for Apligraf and he would like to have this placed today. He denies signs of infection and reports that his right posterior knee wound is closed. 8/25; patient presents for 1 week follow-up. He had no issues with his first Apligraf placement last week. He did however develop an abscess to the back of his right knee.  He reports tenderness to this area. He denies systemic signs of infection. 9/1; patient presents for 1 week follow-up. He has had no issues with his plantar foot wound. The Apligraf has stayed in place over the past week. A wound culture was done at last clinic visit and a new antibiotic was sent to the pharmacy. Patient has not received this yet. He reports some mild tenderness to the back of his right knee. He denies systemic signs of infection. 9/8; patient presents for 1 week follow-up. He states he took Keflex and had diarrhea as a side effect. He was able to complete the course. He reports continued tenderness to the right posterior knee wound. He has some drainage still. He denies increased warmth or erythema to the area. He denies fever/chills or  nausea/vomiting. He has no issues or complaints today about his plantar foot wound. 9/15; patient presents for follow-up. He reports improvement in pain to the back of his right leg. He does not report any drainage. He had an ultrasound completed on 9/9 that showed a superficial fluid collection concerning for abscess. We attempted to call patient with results with no answer. We did put an urgent referral to general surgery and he reports he has not received a call. He currently denies systemic signs of infection. He reports no issues to his left plantar foot wound. Electronic Signature(s) Signed: 08/07/2021 12:58:50 PM By: Kalman Shan DO Entered By: Kalman Shan on 08/07/2021 12:49:10 -------------------------------------------------------------------------------- Physical Exam Details Patient Name: Date of Service: [name] [name], [name] 08/07/2021 10:45 A M Medical Record Number: [id number] Patient Account Number: [id number] Date of Birth/Sex: Treating RN: 1971-06-10 (51 y.o. [name] [name] Primary Care Provider: [name] Other Clinician: Referring Provider: Treating Provider/Extender: Delano Metz in Treatment: 50 Constitutional respirations regular, non-labored and within target range for patient.Marland Kitchen Psychiatric pleasant and cooperative. Notes Left first met head: Wound with granulation tissue present and circumferential callus. Posterior right knee: No open wound or drainage noted. Mild tenderness to palpation Electronic Signature(s) Signed: 08/07/2021 12:58:50 PM By: Kalman Shan DO Entered By: Kalman Shan on 08/07/2021 12:54:08 -------------------------------------------------------------------------------- Physician Orders Details Patient Name: Date of Service: [name] [name], [name] 08/07/2021 10:45 A M Medical Record Number: [id number] Patient Account Number: [id number] Date of Birth/Sex: Treating RN: 05-19-1971 (50 y.o.  [name] [name] Primary Care Provider: [name] Other Clinician: Referring Provider: Treating Provider/Extender: Delano Metz in Treatment: 17 Verbal / Phone Orders: No Diagnosis Coding ICD-10 Coding Code Description [phone] Non-pressure chronic ulcer of other part of left foot with fat layer exposed E11.621 Type 2 diabetes mellitus with foot ulcer S81.801D Unspecified open wound, right lower leg, subsequent encounter E11.42 Type 2 diabetes mellitus with diabetic polyneuropathy Z89.511 Acquired absence of right leg below knee Follow-up Appointments ppointment in 2 weeks. - Dr. Heber [place] Return A Cellular or Tissue Based Products Wound #2R Left Metatarsal head first Cellular or Tissue Based Product Type: - Apligraf #5 9/125/22 -08/07/21: Approved for 5 more applications of Apligraf Cellular or Tissue Based Product applied to wound bed, secured with steri-strips, cover with Adaptic or Mepitel. (DO NOT REMOVE). Edema Control - Lymphedema / SCD / Other Elevate legs to the level of the heart or above for 30 minutes daily and/or when sitting, a frequency of: Avoid standing for long periods of time. Wound Treatment Wound #2R - Metatarsal head first Wound Laterality: Left Cleanser: Soap and Water 1 x Per YEL/85 Days Discharge Instructions: May shower and wash wound  with dial antibacterial soap and water prior to dressing change. Cleanser: Wound Cleanser (Generic) 1 x Per Day/15 Days Discharge Instructions: Cleanse the wound with wound cleanser prior to applying a clean dressing using gauze sponges, not tissue or cotton balls. Peri-Wound Care: Sween Lotion (Moisturizing lotion) 1 x Per Day/15 Days Discharge Instructions: Apply moisturizing lotion as directed Secondary Dressing: Woven Gauze Sponges 2x2 in (Generic) 1 x Per ZGY/17 Days Discharge Instructions: Apply over primary dressing as directed. Secondary Dressing: Optifoam Non-Adhesive Dressing, 4x4 in  (Generic) 1 x Per Day/15 Days Discharge Instructions: Apply over primary dressing cut to make foam donut to help offload Secondary Dressing: ADAPTIC TOUCH 3x4.25 in 1 x Per Day/15 Days Discharge Instructions: Apply over skin sub Secured With: Conforming Stretch Gauze Bandage, Sterile 2x75 (in/in) (Generic) 1 x Per Day/15 Days Discharge Instructions: Secure with stretch gauze as directed. Secured With: 19M Medipore H Soft Cloth Surgical Tape, 2x2 (in/yd) (Generic) 1 x Per Day/15 Days Discharge Instructions: Secure dressing with tape as directed. Electronic Signature(s) Signed: 08/07/2021 12:58:50 PM By: Kalman Shan DO Entered By: Kalman Shan on 08/07/2021 12:54:58 -------------------------------------------------------------------------------- Problem List Details Patient Name: Date of Service: [name] [name], [name] 08/07/2021 10:45 A M Medical Record Number: [id number] Patient Account Number: [id number] Date of Birth/Sex: Treating RN: 07/23/1971 (50 y.o. [name] [name] Primary Care Provider: [name] Other Clinician: Referring Provider: Treating Provider/Extender: Delano Metz in Treatment: 77 Active Problems ICD-10 Encounter Code Description Active Date MDM Diagnosis [phone] Non-pressure chronic ulcer of other part of left foot with fat layer exposed 08/16/2020 No Yes E11.621 Type 2 diabetes mellitus with foot ulcer 08/16/2020 No Yes S81.801D Unspecified open wound, right lower leg, subsequent encounter 04/08/2021 No Yes E11.42 Type 2 diabetes mellitus with diabetic polyneuropathy 08/16/2020 No Yes Z89.511 Acquired absence of right leg below knee 08/16/2020 No Yes Inactive Problems Resolved Problems Electronic Signature(s) Signed: 08/07/2021 12:58:50 PM By: Kalman Shan DO Entered By: Kalman Shan on 08/07/2021 12:15:37 -------------------------------------------------------------------------------- Progress Note Details Patient  Name: Date of Service: [name] [name], [name] 08/07/2021 10:45 A M Medical Record Number: [id number] Patient Account Number: [id number] Date of Birth/Sex: Treating RN: 11/05/1971 (50 y.o. [name] [name], [name] Primary Care Provider: [name] Other Clinician: Referring Provider: Treating Provider/Extender: Delano Metz in Treatment: 68 Subjective Chief Complaint Information obtained from Patient patient is here for a review of the wound on his plantar left first metatarsal head and back of his right leg History of Present Illness (HPI) 04/21/18 ADMISSION This is a 50 year old man who is a type II diabetic. In spite of fact his hemoglobin A1c is actually quite good 5.73 months ago he is at a really difficult time over the last 6-7 months. He developed a rapidly progressive infection in the right foot in November 2018 associated with osteomyelitis and necrotizing fasciitis. 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 The stump required and a revision on 11/24/17. The stump revision was advertised as being secondary to falls. I'm not sure the progressive history here however this area is actually closed over. The patient tells me over the same timeframe he has had wounds on the plantar aspect of his left foot. In the ED saw Dr. Sharol Given in April of this year. 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 It is really not clear that this patient is been dressing this with anything. He came in with the clinic without any specific dressing on  the wound areas using his own tennis shoe. He tells me that he only ambulates of course to do a pivot transfer. He does not have his prosthesis for the right leg as of yet and he blames Medicaid for this. He does however use the foot to push himself along in his wheelchair at home. The patient has not had formal arterial studies. ABI in our clinic on the left was 1.13. Patient's past medical history  includes type 2 diabetes, fracture of the right fibula. I see that he was treated for abscesses on his right buttock and chest in 2016. I did not look at the microbiology of this. 04/28/18; patient comes back in the clinic today with the wound pretty much the same as when he came in here last week. Small opening lots of undermining relatively. He tells [place] that nothing is really been on this for 3 days in spite of the fact that we gave him enough to dress this easily especially such a small wound. He says he lives with his mother, she is not capable of assisting with this he is changing the dressings himself. 05/05/18; much better-looking wound today. Smaller. There is some undermining medially however that's only perhaps 2 mm. No surrounding erythema. We've been using silver collagen 05/12/18; small wound on the first metatarsal head. No undermining no surrounding erythema. We've been using silver collagen he has home health coming out to change 05/20/18 the wound on the first metatarsal head looks better. Covered in surface debris/callus nonviable tissue. Required debridement but post debridement this looks quite good. We've been using silver collagen. He has home health 05/27/18; first metatarsal head wound continues to improve. Just about completely closed. Still a lot of surface debridement callus. We've been using silver collagen 06/06/18; the first metatarsal wound is completely healed over. There is still a lot of callus. I gently removed some of this just to make sure that there was no open area and there is not. The patient mentions to me that his left leg has felt like "lead" for about a week READMISSION 08/16/2020 This is a 50 year old man with type 2 diabetes. He returns to clinic today with a 1 month history of a blister and callus over the first metatarsal head. This is in exactly the same area as when he was here in 2019. 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 He has standard running shoes on the left foot to match the area in his prosthesis. He has only been covering this area with a Band-Aid has not really been specifically dressing this. ABI in our clinic on the left was 1.16. This is essentially stable from the value in 2019 10/1 the patient has a linear wound over the left first metatarsal head. Again not a lot of callus thick skin around this that I removed with a #3 curette. This is not go deep to bone or muscle it does not look to be infected. 10/8 first metatarsal head. The wound looks like it is closing however this is going to be a difficult area to offload in the future. He has a size 15 shoe and he says his shoes are years old. The inserts in these current shoes are totally useless and I have advised him to replace these. He may need a new pair of shoes and I have he got original prosthesis at [facility] [place] I have asked him to go back  there. 11/9; the patient has not been here in more than a month. Not sure he was healed when he was here the last time. I told him he would have to get new shoes and certainly offload the area over the left first metatarsal head. He has done nothing of the kind. He arrives back in clinic with the same old new balance sneakers. A very large separating callus over the first metatarsal head on the left 11/16; he has purchased new shoes and has at least 2 insoles like I asked. The wound is measuring much smaller. He is using silver alginate he changes the dressing himself 11/30; wound is measurably smaller in length of about a half a centimeter. This is generally very little depth. We have been using silver alginate. He changes the dressing himself every second day. 12/14; wound is about the same size. Significant undermining medially relative to the size of the wound. We have been using silver alginate. There is not a way to offload this area as he walks with a prosthesis on the right  leg 12/28; wound is about the same size. Again he has undermining medially. I am using polymen on this 12/02/2020; the wound looks smaller. Still a senescent edge. We have been using polymen 1/24; the wound is come down a few millimeters. Still a senescent edge. I have been using polymen 2/8; the wound is come down slightly. Still with slight undermining from 12-6 o'clock I have been using polymen partially to get offloading on this area which is opposed by his prosthesis on the other leg. 2/22 quite open improvement he still has a comma shaped wound on the left first metatarsal head. Some depth. Using polymen 3/14; he still has the same problem of a comma shaped area over the left first metatarsal head. This seems to have had more depth today at 0.6 cm. We have been using polymen. The patient changes this himself every second day. I explored the idea of a total contact cast on the left leg however this is his driving foot. He was not enamored with the idea of not being able to drive and states that he does not have anybody else to get him around 3/28; again he comes in with a smaller looking wound however there is depth and undermining requiring debridement. We have been using Hydrofera Blue, changed to silver collagen today. The patient is doing the dressing himself 4/11; patient presents today for follow-up of his left diabetic foot wound. He denies any issues in the past 2 weeks. He is currently using silver collagen every other day with dressing changes. He does not use diabetic shoes or special inserts to his sneakers. He does not use felt pads for offloading. 4/19; patient presents for his 1 week follow-up. He denies any issues. He reports using Hydrofera Blue and has not been using silver collagen for dressing changes. He reports minimal drainage to the wound. He overall feels well. 5/3; patient presents for 2-week follow-up. He has been using silver collagen without issues. He has no  complaints today and states he overall feels well. 5/17; patient presents for 2-week follow-up. He states that the sleeve is rubbing up against the back of his right leg and causing a wound. He states this happened a week ago and has not been dressing the wound. The plantar wound is stable and he has been using collagen every other day. 5/24; Patient was had a recent hospitalization for cellulitis of his right popliteal fossa. He  was admitted and started on IV antibiotics. He does not recall the plantar wound dressed while inpatient. He feels a lot better today. He was discharged on 5/21 on oral antibiotics and has not picked this up until today. He reports using collagen to the plantar wound since discharge. He is keeping the right posterior knee wound covered. He is getting a new sleeve for his prostatic on 5/26. He currently denies signs of infection. 6/1; patient presents for 1 week follow-up. He is still on doxycycline. He has not been dressing the back of the right knee wound. He states he is receiving his new prosthetic sleeve tomorrow. He states he is using silver alginate to the plantar foot wound. He currently denies signs of infection. He states he has not been offloading the left foot wound 6/7; patient presents for 1 week follow-up. He completes his last dose of doxycycline today. He finally received his prosthetic sleeve for the right side and he states it feels much better. He has been using antibiotic ointment to the back of that right knee wound. He has been using silver alginate to the plantar foot wound. He currently denies signs of infection. 6/21; patient presents for 2-week follow-up. He reports no issues with his prosthetic sleeve. He has been using antibiotic ointment to the back of the right knee. He has been using silver alginate to the plantar foot wound. He has no issues or complaints today. He denies signs of infection. 7/5; patient presents for 2-week follow-up. He  reports no issues or complaints today. He continues to use antibiotic ointment to the right posterior knee wound and silver alginate to the left plantar foot wound. He denies signs of infection. 7/19; patient presents for 2-week follow-up. He continues to use antibiotic ointment to the right posterior knee and silver alginate to the left plantar foot wound. He has no issues or complaints today. He denies signs of infection. 8/4; patient presents for 2-week follow-up. He has no issues or complaints today. He denies signs of infection. 8/18; patient presents for 2-week follow-up. He has no issues or concerns today. He has been approved for Apligraf and he would like to have this placed today. He denies signs of infection and reports that his right posterior knee wound is closed. 8/25; patient presents for 1 week follow-up. He had no issues with his first Apligraf placement last week. He did however develop an abscess to the back of his right knee. He reports tenderness to this area. He denies systemic signs of infection. 9/1; patient presents for 1 week follow-up. He has had no issues with his plantar foot wound. The Apligraf has stayed in place over the past week. A wound culture was done at last clinic visit and a new antibiotic was sent to the pharmacy. Patient has not received this yet. He reports some mild tenderness to the back of his right knee. He denies systemic signs of infection. 9/8; patient presents for 1 week follow-up. He states he took Keflex and had diarrhea as a side effect. He was able to complete the course. He reports continued tenderness to the right posterior knee wound. He has some drainage still. He denies increased warmth or erythema to the area. He denies fever/chills or nausea/vomiting. He has no issues or complaints today about his plantar foot wound. 9/15; patient presents for follow-up. He reports improvement in pain to the back of his right leg. He does not report any  drainage. He had an ultrasound completed on 9/9  that showed a superficial fluid collection concerning for abscess. We attempted to call patient with results with no answer. We did put an urgent referral to general surgery and he reports he has not received a call. He currently denies systemic signs of infection. He reports no issues to his left plantar foot wound. Patient History Information obtained from Patient. Family History Diabetes - Maternal Grandparents,Mother, Heart Disease - Father, Hypertension - Father, No family history of Cancer, Hereditary Spherocytosis, Kidney Disease, Lung Disease, Seizures, Stroke, Thyroid Problems, Tuberculosis. Social History Current every day smoker, Marital Status - Single, Alcohol Use - Never, Drug Use - No History, Caffeine Use - Never. Medical History Eyes Denies history of Cataracts, Glaucoma, Optic Neuritis Ear/Nose/Mouth/Throat Denies history of Chronic sinus problems/congestion, Middle ear problems Hematologic/Lymphatic Patient has history of Anemia Denies history of Hemophilia, Human Immunodeficiency Virus, Lymphedema, Sickle Cell Disease Respiratory Denies history of Aspiration, Asthma, Chronic Obstructive Pulmonary Disease (COPD), Pneumothorax, Sleep Apnea, Tuberculosis Cardiovascular Patient has history of Hypertension, Peripheral Venous Disease Denies history of Angina, Arrhythmia, Congestive Heart Failure, Coronary Artery Disease, Deep Vein Thrombosis, Hypotension, Myocardial Infarction, Peripheral Arterial Disease, Phlebitis, Vasculitis Gastrointestinal Denies history of Cirrhosis , Colitis, Crohnoos, Hepatitis A, Hepatitis B, Hepatitis C Endocrine Patient has history of Type II Diabetes - oral meds Denies history of Type I Diabetes Genitourinary Denies history of End Stage Renal Disease Immunological Denies history of Lupus Erythematosus, Raynaudoos, Scleroderma Integumentary (Skin) Denies history of History of  Burn Musculoskeletal Denies history of Gout, Rheumatoid Arthritis, Osteoarthritis, Osteomyelitis Neurologic Denies history of Dementia, Neuropathy, Quadriplegia, Paraplegia, Seizure Disorder Oncologic Denies history of Received Chemotherapy, Received Radiation Psychiatric Denies history of Anorexia/bulimia, Confinement Anxiety Hospitalization/Surgery History - fever 105, sepsis cellulitis right popliteal fossa 04/09/2021. Medical A Surgical History Notes [name] Constitutional Symptoms (General Health) falls , leukocytosis Respiratory During waking hours and catches himself not breathing, "gasp for air". Saw Dr Wynonia Lawman, he couldn't find a reason Objective Constitutional respirations regular, non-labored and within target range for patient.. Vitals Time Taken: 10:53 AM, Height: 77 in, Weight: 232 lbs, BMI: 27.5, Temperature: 97.9 F, Pulse: 99 bpm, Respiratory Rate: 17 breaths/min, Blood Pressure: 135/87 mmHg. Psychiatric pleasant and cooperative. General Notes: Left first met head: Wound with granulation tissue present and circumferential callus. Posterior right knee: No open wound or drainage noted. Mild tenderness to palpation Integumentary (Hair, Skin) Wound #2R status is Open. Original cause of wound was Blister. The date acquired was: 06/23/2020. The wound has been in treatment 50 weeks. The wound is located on the Left Metatarsal head first. The wound measures 0.2cm length x 0.2cm width x 0.2cm depth; 0.031cm^2 area and 0.006cm^3 volume. There is Fat Layer (Subcutaneous Tissue) exposed. There is no tunneling or undermining noted. There is a small amount of serosanguineous drainage noted. The wound margin is well defined and not attached to the wound base. There is large (67-100%) red, pink granulation within the wound bed. There is no necrotic tissue within the wound bed. 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 I debrided nonviable tissue. Apligraf #5 was placed in standard fashion. We have not heard back for patient approval of continued Apligraf. For now we will have follow-up in 2 weeks. Patient had  a 3.3 x 3.1 x 0.3 cm superficial fluid collection concerning for abscess. He still has some tenderness on exam. A wound to the posterior right knee is a reoccurring issue for the patient. I would like for him to be seen by general surgery. We will follow this up. Procedures Wound #2R Pre-procedure diagnosis of Wound #2R is a Diabetic Wound/Ulcer of the Lower Extremity located on the Left Metatarsal head first .Severity of Tissue Pre Debridement is: Fat layer exposed. There was a Excisional Skin/Subcutaneous Tissue Debridement with a total area of 0.04 sq cm performed by Kalman Shan, DO. With the following instrument(s): Curette to remove Non-Viable tissue/material. Material removed includes Subcutaneous Tissue. No specimens were taken. A time out was conducted at 11:35, prior to the start of the procedure. A Minimum amount of bleeding was controlled with Pressure. The procedure was tolerated well. Post Debridement Measurements: 0.2cm length x 0.2cm width x 0.2cm depth; 0.006cm^3 volume. Character of Wound/Ulcer Post Debridement is stable. Severity of Tissue Post Debridement is: Fat layer exposed. Post procedure Diagnosis Wound #2R: Same as Pre-Procedure Pre-procedure diagnosis of Wound #2R is a Diabetic Wound/Ulcer of the Lower Extremity located on the Left Metatarsal head first. A skin graft procedure using a bioengineered skin substitute/cellular or tissue based product was performed by Kalman Shan, DO  with the following instrument(s): Blade, Forceps, and Scissors. Apligraf was applied and secured with Steri-Strips. 4 sq cm of product was utilized and 40 sq cm was wasted due to wound and product size. Post Application, Adaptic was applied. A Time Out was conducted at 11:35, prior to the start of the procedure. The procedure was tolerated well. Post procedure Diagnosis Wound #2R: Same as Pre-Procedure . Plan Follow-up Appointments: Return Appointment in 2 weeks. - Dr. [place] Cellular or Tissue Based Products: Wound #2R Left Metatarsal head first: Cellular or Tissue Based Product Type: - Apligraf #5 9/125/22 -08/07/21: Approved for 5 more applications of Apligraf Cellular or Tissue Based Product applied to wound bed, secured with steri-strips, cover with Adaptic or Mepitel. (DO NOT REMOVE). Edema Control - Lymphedema / SCD / Other: Elevate legs to the level of the heart or above for 30 minutes daily and/or when sitting, a frequency of: Avoid standing for long periods of time. WOUND #2R: - Metatarsal head first Wound Laterality: Left Cleanser: Soap and Water 1 x Per Day/15 Days Discharge Instructions: May shower and wash wound with dial antibacterial soap and water prior to dressing change. Cleanser: Wound Cleanser (Generic) 1 x Per Day/15 Days Discharge Instructions: Cleanse the wound with wound cleanser prior to applying a clean dressing using gauze sponges, not tissue or cotton balls. Peri-Wound Care: Sween Lotion (Moisturizing lotion) 1 x Per Day/15 Days Discharge Instructions: Apply moisturizing lotion as directed Secondary Dressing: Woven Gauze Sponges 2x2 in (Generic) 1 x Per DJM/42 Days Discharge Instructions: Apply over primary dressing as directed. Secondary Dressing: Optifoam Non-Adhesive Dressing, 4x4 in (Generic) 1 x Per Day/15 Days Discharge Instructions: Apply over primary dressing cut to make foam donut to help offload Secondary Dressing: ADAPTIC TOUCH 3x4.25 in 1 x Per  Day/15 Days Discharge Instructions: Apply over skin sub Secured With: Conforming Stretch Gauze Bandage, Sterile 2x75 (in/in) (Generic) 1 x Per Day/15 Days Discharge Instructions: Secure with stretch gauze as directed. Secured With: 45M Medipore H Soft Cloth Surgical T ape, 2x2 (in/yd) (Generic) 1 x Per Day/15 Days Discharge Instructions: Secure dressing with tape as directed. 1. In office sharp debridement 2. Apligraf #5 placed in standard fashion 3. Follow-up  in 2 weeks 4. Follow-up with general surgery Electronic Signature(s) Signed: 08/07/2021 12:58:50 PM By: Kalman Shan DO Entered By: Kalman Shan on 08/07/2021 12:57:04 -------------------------------------------------------------------------------- HxROS Details Patient Name: Date of Service: [name] [name], [name] 08/07/2021 10:45 A M Medical Record Number: [id number] Patient Account Number: [id number] Date of Birth/Sex: Treating RN: [birth year] (50 y.o. [name] [name] Primary Care Provider: [name] Other Clinician: Referring Provider: Treating Provider/Extender: Delano Metz in Treatment: 7 Information Obtained From Patient Constitutional Symptoms (General Health) Medical History: Past Medical History Notes: falls , leukocytosis Eyes Medical History: Negative for: Cataracts; Glaucoma; Optic Neuritis Ear/Nose/Mouth/Throat Medical History: Negative for: Chronic sinus problems/congestion; Middle ear problems Hematologic/Lymphatic Medical History: Positive for: Anemia Negative for: Hemophilia; Human Immunodeficiency Virus; Lymphedema; Sickle Cell Disease Respiratory Medical History: Negative for: Aspiration; Asthma; Chronic Obstructive Pulmonary Disease (COPD); Pneumothorax; Sleep Apnea; Tuberculosis Past Medical History Notes: During waking hours and catches himself not breathing, "gasp for air". Saw Dr Wynonia Lawman, he couldn't find a reason Cardiovascular Medical  History: Positive for: Hypertension; Peripheral Venous Disease Negative for: Angina; Arrhythmia; Congestive Heart Failure; Coronary Artery Disease; Deep Vein Thrombosis; Hypotension; Myocardial Infarction; Peripheral Arterial Disease; Phlebitis; Vasculitis Gastrointestinal Medical History: Negative for: Cirrhosis ; Colitis; Crohns; Hepatitis A; Hepatitis B; Hepatitis C Endocrine Medical History: Positive for: Type II Diabetes - oral meds Negative for: Type I Diabetes Time with diabetes: 2014 Treated with: Oral agents Blood sugar tested every day: Yes Tested : Blood sugar testing results: Breakfast: 91 Genitourinary Medical History: Negative for: End Stage Renal Disease Immunological Medical History: Negative for: Lupus Erythematosus; Raynauds; Scleroderma Integumentary (Skin) Medical History: Negative for: History of Burn Musculoskeletal Medical History: Negative for: Gout; Rheumatoid Arthritis; Osteoarthritis; Osteomyelitis Neurologic Medical History: Negative for: Dementia; Neuropathy; Quadriplegia; Paraplegia; Seizure Disorder Oncologic Medical History: Negative for: Received Chemotherapy; Received Radiation Psychiatric Medical History: Negative for: Anorexia/bulimia; Confinement Anxiety Immunizations Pneumococcal Vaccine: Received Pneumococcal Vaccination: No Implantable Devices No devices added Hospitalization / Surgery History Type of Hospitalization/Surgery fever 105, sepsis cellulitis right popliteal fossa 04/09/2021 Family and Social History Cancer: No; Diabetes: Yes - Maternal Grandparents,Mother; Heart Disease: Yes - Father; Hereditary Spherocytosis: No; Hypertension: Yes - Father; Kidney Disease: No; Lung Disease: No; Seizures: No; Stroke: No; Thyroid Problems: No; Tuberculosis: No; Current every day smoker; Marital Status - Single; Alcohol Use: Never; Drug Use: No History; Caffeine Use: Never; Financial Concerns: No; Food, Clothing or Shelter Needs: No;  Support System Lacking: No; Transportation Concerns: No Electronic Signature(s) Signed: 08/07/2021 12:58:50 PM By: Kalman Shan DO Signed: 08/07/2021 4:34:42 PM By: Rhae Hammock RN Entered By: Kalman Shan on 08/07/2021 12:53:10 -------------------------------------------------------------------------------- SuperBill Details Patient Name: Date of Service: [name] [name], [name] 08/07/2021 Medical Record Number: [id number] Patient Account Number: [id number] Date of Birth/Sex: Treating RN: Jan 09, 1971 (50 y.o. [name] [name] Primary Care Provider: [name] Other Clinician: Referring Provider: Treating Provider/Extender: Delano Metz in Treatment: 50 Diagnosis Coding ICD-10 Codes Code Description [phone] Non-pressure chronic ulcer of other part of left foot with fat layer exposed E11.621 Type 2 diabetes mellitus with foot ulcer S81.801D Unspecified open wound, right lower leg, subsequent encounter E11.42 Type 2 diabetes mellitus with diabetic polyneuropathy Z89.511 Acquired absence of right leg below knee Facility Procedures CPT4 Code: [id number] Description: (Facility Use Only) Apligraf 1 SQ CM Modifier: Quantity: 50 CPT4 Code: [id number] Description: 02585 - SKIN SUB GRAFT FACE/NK/HF/G ICD-10 Diagnosis Description L97.522 Non-pressure chronic ulcer of other part of left foot with fat layer exposed Modifier: Quantity: 1 Physician Procedures :  CPT4 Code Description Modifier [id number] - WC PHYS SKIN SUB GRAFT FACE/NK/HF/G ICD-10 Diagnosis Description L97.522 Non-pressure chronic ulcer of other part of left foot with fat layer exposed Quantity: 1 Electronic Signature(s) Signed: 08/07/2021 12:58:50 PM By: Kalman Shan DO Entered By: Kalman Shan on 08/07/2021 12:58:00

## 2021-08-07 NOTE — Progress Notes (Signed)
[name] [name] ([id number]) Visit Report for 08/07/2021 Arrival Information Details Patient Name: Date of Service: [name] [name] 08/07/2021 10:45 A M Medical Record Number: [id number] Patient Account Number: [id number] Date of Birth/Sex: Treating RN: 09/09/1971 (50 y.o. [name] [name], [name] Primary Care Cathleen Yagi: [name] Other Clinician: Referring Admire Bunnell: Treating Mairi Stagliano/Extender: Delano Metz in Treatment: 5 Visit Information History Since Last Visit Added or deleted any medications: No Patient Arrived: Ambulatory Any new allergies or adverse reactions: No Arrival Time: 10:51 Had a fall or experienced change in No Accompanied By: self activities of daily living that may affect Transfer Assistance: None risk of falls: Patient Identification Verified: Yes Signs or symptoms of abuse/neglect since last visito No Secondary Verification Process Completed: Yes Hospitalized since last visit: No Patient Requires Transmission-Based Precautions: No Implantable device outside of the clinic excluding No Patient Has Alerts: No cellular tissue based products placed in the center since last visit: Has Dressing in Place as Prescribed: Yes Pain Present Now: No Electronic Signature(s) Signed: 08/07/2021 11:03:59 AM By: Sandre Kitty Entered By: Sandre Kitty on 08/07/2021 10:53:00 -------------------------------------------------------------------------------- Encounter Discharge Information Details Patient Name: Date of Service: [name] [name] ND, [name] 08/07/2021 10:45 A M Medical Record Number: [id number] Patient Account Number: [id number] Date of Birth/Sex: Treating RN: 1971-05-23 (50 y.o. [name] [name] Primary Care Cassidy Tashiro: [name] Other Clinician: Referring Danasha Melman: Treating [name] Baskett/Extender: Delano Metz in Treatment: 59 Encounter Discharge Information Items Post Procedure Vitals Discharge  Condition: Stable Temperature (F): 97.9 Ambulatory Status: Ambulatory Pulse (bpm): 99 Discharge Destination: Home Respiratory Rate (breaths/min): 18 Transportation: Private Auto Blood Pressure (mmHg): 135/87 Schedule Follow-up Appointment: Yes Clinical Summary of Care: Provided on 08/07/2021 Form Type Recipient Paper Patient Patient Electronic Signature(s) Signed: 08/07/2021 5:57:57 PM By: Lorrin Jackson Entered By: Lorrin Jackson on 08/07/2021 12:07:12 -------------------------------------------------------------------------------- Lower Extremity Assessment Details Patient Name: Date of Service: [name] [name] ND, [name] 08/07/2021 10:45 A M Medical Record Number: [id number] Patient Account Number: [id number] Date of Birth/Sex: Treating RN: Aug 01, 1971 (50 y.o. [name] [name] Primary Care Kelsee Preslar: [name] Other Clinician: Referring Mariam Helbert: Treating Marquez Ceesay/Extender: Delano Metz in Treatment: 50 Edema Assessment Assessed: [name]: Yes] [name]: No] Edema: [Left: N] [Right: o] Calf Left: Right: Point of Measurement: 50 cm From Medial Instep 38 cm Ankle Left: Right: Point of Measurement: 11 cm From Medial Instep 25.8 cm Vascular Assessment Pulses: Dorsalis Pedis Palpable: [Left:Yes] Electronic Signature(s) Signed: 08/07/2021 5:57:57 PM By: Lorrin Jackson Entered By: Lorrin Jackson on 08/07/2021 11:32:42 -------------------------------------------------------------------------------- Multi Wound Chart Details Patient Name: Date of Service: [name] [name] ND, [name] 08/07/2021 10:45 A M Medical Record Number: [id number] Patient Account Number: [id number] Date of Birth/Sex: Treating RN: 1971/11/19 (50 y.o. [name] [name], [name] Primary Care [name]: [name] Other Clinician: Referring Deaglan Lile: Treating Viveca Beckstrom/Extender: Delano Metz in Treatment: 50 Vital Signs Height(in): 77 Pulse(bpm):  99 Weight(lbs): 232 Blood Pressure(mmHg): 135/87 Body Mass Index(BMI): 28 Temperature(F): 97.9 Respiratory Rate(breaths/min): 17 Photos: [2R:Left Metatarsal head first] [N/A:N/A N/A] Wound Location: [2R:Blister] [N/A:N/A] Wounding Event: [2R:Diabetic Wound/Ulcer of the Lower] [N/A:N/A] Primary Etiology: [2R:Extremity Anemia, Hypertension, Peripheral] [N/A:N/A] Comorbid History: [2R:Venous Disease, Type II Diabetes 06/23/2020] [N/A:N/A] Date Acquired: [2R:50] [N/A:N/A] Weeks of Treatment: [2R:Open] [N/A:N/A] Wound Status: [2R:Yes] [N/A:N/A] Wound Recurrence: [2R:0.2x0.2x0.2] [N/A:N/A] Measurements L x W x D (cm) [2R:0.031] [N/A:N/A] A (cm) : rea [2R:0.006] [N/A:N/A] Volume (cm) : [2R:0.00%] [N/A:N/A] % Reduction in A [2R:rea: -100.00%] [N/A:N/A] % Reduction in Volume: [2R:Grade 2] [N/A:N/A] Classification: [2R:Small] [N/A:N/A] Exudate  A mount: [2R:Serosanguineous] [N/A:N/A] Exudate Type: [2R:red, brown] [N/A:N/A] Exudate Color: [2R:Well defined, not attached] [N/A:N/A] Wound Margin: [2R:Large (67-100%)] [N/A:N/A] Granulation A mount: [2R:Red, Pink] [N/A:N/A] Granulation Quality: [2R:None Present (0%)] [N/A:N/A] Necrotic A mount: [2R:Fat Layer (Subcutaneous Tissue): Yes N/A] Exposed Structures: [2R:Fascia: No Tendon: No Muscle: No Joint: No Bone: No None] [N/A:N/A] Epithelialization: [2R:Debridement - Excisional] [N/A:N/A] Debridement: Pre-procedure Verification/Time Out 11:35 [N/A:N/A] Taken: [2R:Subcutaneous] [N/A:N/A] Tissue Debrided: [2R:Skin/Subcutaneous Tissue] [N/A:N/A] Level: [2R:0.04] [N/A:N/A] Debridement A (sq cm): [2R:rea Curette] [N/A:N/A] Instrument: [2R:Minimum] [N/A:N/A] Bleeding: [2R:Pressure] [N/A:N/A] Hemostasis A chieved: [2R:Procedure was tolerated well] [N/A:N/A] Debridement Treatment Response: [2R:0.2x0.2x0.2] [N/A:N/A] Post Debridement Measurements L x W x D (cm) [2R:0.006] [N/A:N/A] Post Debridement Volume: (cm) [2R:Cellular or Tissue Based  Product] [N/A:N/A] Procedures Performed: [2R:Debridement] Treatment Notes Wound #2R (Metatarsal head first) Wound Laterality: Left Cleanser Soap and Water Discharge Instruction: May shower and wash wound with dial antibacterial soap and water prior to dressing change. Wound Cleanser Discharge Instruction: Cleanse the wound with wound cleanser prior to applying a clean dressing using gauze sponges, not tissue or cotton balls. Peri-Wound Care Sween Lotion (Moisturizing lotion) Discharge Instruction: Apply moisturizing lotion as directed Topical Primary Dressing Secondary Dressing Woven Gauze Sponges 2x2 in Discharge Instruction: Apply over primary dressing as directed. Optifoam Non-Adhesive Dressing, 4x4 in Discharge Instruction: Apply over primary dressing cut to make foam donut to help offload ADAPTIC TOUCH 3x4.25 in Discharge Instruction: Apply over skin sub Secured With Conforming Stretch Gauze Bandage, Sterile 2x75 (in/in) Discharge Instruction: Secure with stretch gauze as directed. 93M Medipore H Soft Cloth Surgical T ape, 2x2 (in/yd) Discharge Instruction: Secure dressing with tape as directed. Compression Wrap Compression Stockings Add-Ons Electronic Signature(s) Signed: 08/07/2021 12:58:50 PM By: Kalman Shan DO Signed: 08/07/2021 4:34:42 PM By: Rhae Hammock RN Entered By: Kalman Shan on 08/07/2021 12:15:43 -------------------------------------------------------------------------------- Multi-Disciplinary Care Plan Details Patient Name: Date of Service: [name] [name] ND, [name] 08/07/2021 10:45 A M Medical Record Number: [id number] Patient Account Number: [id number] Date of Birth/Sex: Treating RN: December 19, 1970 (50 y.o. [name] [name] Primary Care Vondell Babers: [name] Other Clinician: Referring Daelyn Mozer: Treating Valentina Alcoser/Extender: Delano Metz in Treatment: 55 Multidisciplinary Care Plan reviewed with physician Active  Inactive Wound/Skin Impairment Nursing Diagnoses: Impaired tissue integrity Goals: Patient/caregiver will verbalize understanding of skin care regimen Date Initiated: 11/19/2020 Target Resolution Date: 08/21/2021 Goal Status: Active Ulcer/skin breakdown will have a volume reduction of 30% by week 4 Date Initiated: 08/16/2020 Date Inactivated: 03/03/2021 Target Resolution Date: 03/10/2021 Goal Status: Met Interventions: Provide education on ulcer and skin care Notes: 07/24/21: Wound care regimen ongoing. Electronic Signature(s) Signed: 08/07/2021 5:57:57 PM By: Lorrin Jackson Entered By: Lorrin Jackson on 08/07/2021 11:33:42 -------------------------------------------------------------------------------- Pain Assessment Details Patient Name: Date of Service: [name] [name] ND, [name] 08/07/2021 10:45 A M Medical Record Number: [id number] Patient Account Number: [id number] Date of Birth/Sex: Treating RN: [birth year] (50 y.o. [name] [name], [name] Primary Care Alix Lahmann: [name] Other Clinician: Referring Ermagene Saidi: Treating Mazie Fencl/Extender: Delano Metz in Treatment: 50 Active Problems Location of Pain Severity and Description of Pain Patient Has Paino No Site Locations Pain Management and Medication Current Pain Management: Electronic Signature(s) Signed: 08/07/2021 11:03:59 AM By: Sandre Kitty Signed: 08/07/2021 4:34:42 PM By: Rhae Hammock RN Entered By: Sandre Kitty on 08/07/2021 10:53:30 -------------------------------------------------------------------------------- Patient/Caregiver Education Details Patient Name: Date of Service: [name] [name] 9/15/2022andnbsp10:45 A M Medical Record Number: [id number] Patient Account Number: [id number] Date of Birth/Gender: Treating RN: August 06, 1971 (50 y.o. [name] [name] Primary Care Physician: [name]  Other Clinician: Referring Physician: Treating Physician/Extender:  Delano Metz in Treatment: 75 Education Assessment Education Provided To: Patient Education Topics Provided Wound/Skin Impairment: Methods: Demonstration, Explain/Verbal, Printed Responses: State content correctly Electronic Signature(s) Signed: 08/07/2021 5:57:57 PM By: Lorrin Jackson Entered By: Lorrin Jackson on 08/07/2021 11:34:01 -------------------------------------------------------------------------------- Wound Assessment Details Patient Name: Date of Service: [name] [name] ND, [name] 08/07/2021 10:45 A M Medical Record Number: [id number] Patient Account Number: [id number] Date of Birth/Sex: Treating RN: 1971/04/08 (50 y.o. [name] [name], [name] Primary Care Yanis Juma: [name] Other Clinician: Referring Jayliani Wanner: Treating Raffaela Ladley/Extender: Delano Metz in Treatment: 49 Wound Status Wound Number: 2R Primary Diabetic Wound/Ulcer of the Lower Extremity Etiology: Wound Location: Left Metatarsal head first Wound Status: Open Wounding Event: Blister Comorbid Anemia, Hypertension, Peripheral Venous Disease, Type II Date Acquired: 06/23/2020 History: Diabetes Weeks Of Treatment: 50 Clustered Wound: No Photos Wound Measurements Length: (cm) 0.2 Width: (cm) 0.2 Depth: (cm) 0.2 Area: (cm) 0.031 Volume: (cm) 0.006 % Reduction in Area: 0% % Reduction in Volume: -100% Epithelialization: None Tunneling: No Undermining: No Wound Description Classification: Grade 2 Wound Margin: Well defined, not attached Exudate Amount: Small Exudate Type: Serosanguineous Exudate Color: red, brown Foul Odor After Cleansing: No Slough/Fibrino No Wound Bed Granulation Amount: Large (67-100%) Exposed Structure Granulation Quality: Red, Pink Fascia Exposed: No Necrotic Amount: None Present (0%) Fat Layer (Subcutaneous Tissue) Exposed: Yes Tendon Exposed: No Muscle Exposed: No Joint Exposed: No Bone Exposed: No Treatment  Notes Wound #2R (Metatarsal head first) Wound Laterality: Left Cleanser Soap and Water Discharge Instruction: May shower and wash wound with dial antibacterial soap and water prior to dressing change. Wound Cleanser Discharge Instruction: Cleanse the wound with wound cleanser prior to applying a clean dressing using gauze sponges, not tissue or cotton balls. Peri-Wound Care Sween Lotion (Moisturizing lotion) Discharge Instruction: Apply moisturizing lotion as directed Topical Primary Dressing Secondary Dressing Woven Gauze Sponges 2x2 in Discharge Instruction: Apply over primary dressing as directed. Optifoam Non-Adhesive Dressing, 4x4 in Discharge Instruction: Apply over primary dressing cut to make foam donut to help offload ADAPTIC TOUCH 3x4.25 in Discharge Instruction: Apply over skin sub Secured With Conforming Stretch Gauze Bandage, Sterile 2x75 (in/in) Discharge Instruction: Secure with stretch gauze as directed. 63M Medipore H Soft Cloth Surgical T ape, 2x2 (in/yd) Discharge Instruction: Secure dressing with tape as directed. Compression Wrap Compression Stockings Add-Ons Electronic Signature(s) Signed: 08/07/2021 4:34:42 PM By: Rhae Hammock RN Signed: 08/07/2021 5:57:57 PM By: Lorrin Jackson Previous Signature: 08/07/2021 11:03:59 AM Version By: Sandre Kitty Entered By: Lorrin Jackson on 08/07/2021 11:33:05 -------------------------------------------------------------------------------- Vitals Details Patient Name: Date of Service: [name], [name] 08/07/2021 10:45 A M Medical Record Number: [id number] Patient Account Number: [id number] Date of Birth/Sex: Treating RN: 1971/03/30 (50 y.o. [name] [name], [name] Primary Care Lavone Weisel: [name] Other Clinician: Referring Mohd. Derflinger: Treating Kiaira Pointer/Extender: Delano Metz in Treatment: 50 Vital Signs Time Taken: 10:53 Temperature (F): 97.9 Height (in): 77 Pulse (bpm):  99 Weight (lbs): 232 Respiratory Rate (breaths/min): 17 Body Mass Index (BMI): 27.5 Blood Pressure (mmHg): 135/87 Reference Range: 80 - 120 mg / dl Electronic Signature(s) Signed: 08/07/2021 11:03:59 AM By: Sandre Kitty Entered By: Sandre Kitty on 08/07/2021 10:53:25

## 2021-08-14 ENCOUNTER — Ambulatory Visit: Payer: Medicare Other | Admitting: Endocrinology

## 2021-08-19 ENCOUNTER — Other Ambulatory Visit: Payer: Self-pay

## 2021-08-19 ENCOUNTER — Encounter (HOSPITAL_BASED_OUTPATIENT_CLINIC_OR_DEPARTMENT_OTHER): Payer: Medicare Other | Admitting: Internal Medicine

## 2021-08-19 DIAGNOSIS — L97522 Non-pressure chronic ulcer of other part of left foot with fat layer exposed: Secondary | ICD-10-CM | POA: Diagnosis not present

## 2021-08-19 DIAGNOSIS — E11621 Type 2 diabetes mellitus with foot ulcer: Secondary | ICD-10-CM | POA: Diagnosis not present

## 2021-08-20 ENCOUNTER — Encounter (HOSPITAL_COMMUNITY)
Admission: RE | Admit: 2021-08-20 | Discharge: 2021-08-20 | Disposition: A | Discharge status: Home / Self Care | Payer: Medicare Other | Source: Ambulatory Visit | Attending: Nephrology | Admitting: Nephrology

## 2021-08-20 ENCOUNTER — Encounter (HOSPITAL_COMMUNITY): Payer: Medicare Other

## 2021-08-20 VITALS — BP 128/90 | HR 84 | Temp 97.4°F | Resp 18

## 2021-08-20 DIAGNOSIS — N179 Acute kidney failure, unspecified: Secondary | ICD-10-CM

## 2021-08-20 DIAGNOSIS — D62 Acute posthemorrhagic anemia: Secondary | ICD-10-CM

## 2021-08-20 DIAGNOSIS — D649 Anemia, unspecified: Secondary | ICD-10-CM

## 2021-08-20 LAB — POCT HEMOGLOBIN-HEMACUE: Hemoglobin: 12.5 g/dL — ABNORMAL LOW (ref 13.0–17.0)

## 2021-08-20 MED ORDER — EPOETIN ALFA-EPBX 10000 UNIT/ML IJ SOLN: 10000.0000 [IU] | INTRAMUSCULAR | Status: DC

## 2021-08-20 MED ORDER — EPOETIN ALFA-EPBX 10000 UNIT/ML IJ SOLN
INTRAMUSCULAR | Status: AC
  Filled 2021-08-20: qty 1

## 2021-08-20 NOTE — Progress Notes (Signed)
[name] [name], [name] [name] ([id number]) Visit Report for 08/19/2021 Chief Complaint Document Details Patient Name: Date of Service: [name] Arrow [name] 08/19/2021 2:00 PM Medical Record Number: [id number] Patient Account Number: [id number] Date of Birth/Sex: Treating RN: [name]-[birth year] (50 y.o. M) Primary Care Provider: [name] Other Clinician: Referring Provider: Treating Provider/Extender: Delano Metz in Treatment: 38 Information Obtained from: Patient Chief Complaint patient is here for a review of the wound on his plantar left first metatarsal head and back of his right leg Electronic Signature(s) Signed: 08/19/2021 3:36:50 PM By: Kalman Shan DO Entered By: Kalman Shan on 08/19/2021 15:30:10 -------------------------------------------------------------------------------- Cellular or Tissue Based Product Details Patient Name: Date of Service: [name] [name], [name] 08/19/2021 2:00 PM Medical Record Number: [id number] Patient Account Number: [id number] Date of Birth/Sex: Treating RN: [birth year] (50 y.o. [name] [name] Primary Care Provider: [name] Other Clinician: Referring Provider: Treating Provider/Extender: Delano Metz in Treatment: 65 Cellular or Tissue Based Product Type Wound #2R Left Metatarsal head first Applied to: Performed By: Physician Kalman Shan, DO Cellular or Tissue Based Product Type: Apligraf Level of Consciousness (Pre-procedure): Awake and Alert Pre-procedure Verification/Time Out Yes - 15:23 Taken: Location: genitalia / hands / feet / multiple digits Wound Size (sq cm): 0.08 Product Size (sq cm): 44 Waste Size (sq cm): 33 Waste Reason: wound size Amount of Product Applied (sq cm): 11 Instrument Used: Blade, Forceps, Scissors Lot #: [id number] Order #: [id number] Expiration Date: 08/30/2021 Fenestrated: Yes Instrument: Blade Reconstituted: Yes Solution Type: saline Solution  Amount: 5 ml Lot #: [id number] Solution Expiration Date: 07/24/2022 Secured: Yes Secured With: Steri-Strips Dressing Applied: Yes Primary Dressing: adaptic, gauze Procedural Pain: 0 Post Procedural Pain: 0 Response to Treatment: Procedure was tolerated well Level of Consciousness (Post- Awake and Alert procedure): Post Procedure Diagnosis Same as Pre-procedure Electronic Signature(s) Signed: 08/19/2021 3:36:50 PM By: Kalman Shan DO Signed: 08/20/2021 5:46:28 PM By: Baruch Gouty RN, BSN Entered By: Baruch Gouty on 08/19/2021 15:25:50 -------------------------------------------------------------------------------- Debridement Details Patient Name: Date of Service: [name] [name], [name] 08/19/2021 2:00 PM Medical Record Number: [id number] Patient Account Number: [id number] Date of Birth/Sex: Treating RN: [name] [name] (50 y.o. [name] [name] Primary Care Provider: [name] Other Clinician: Referring Provider: Treating Provider/Extender: Delano Metz in Treatment: 52 Debridement Performed for Assessment: Wound #2R Left Metatarsal head first Performed By: Physician Kalman Shan, DO Debridement Type: Debridement Severity of Tissue Pre Debridement: Fat layer exposed Level of Consciousness (Pre-procedure): Awake and Alert Pre-procedure Verification/Time Out Yes - 15:15 Taken: Start Time: 15:19 T Area Debrided (L x W): otal 0.7 (cm) x 0.7 (cm) = 0.49 (cm) Tissue and other material debrided: Non-Viable, Callus, Skin: Epidermis Level: Skin/Epidermis Debridement Description: Selective/Open Wound Instrument: Curette Bleeding: Minimum Hemostasis Achieved: Pressure Procedural Pain: 0 Post Procedural Pain: 0 Response to Treatment: Procedure was tolerated well Level of Consciousness (Post- Awake and Alert procedure): Post Debridement Measurements of Total Wound Length: (cm) 0.4 Width: (cm) 0.3 Depth: (cm) 0.3 Volume: (cm)  0.028 Character of Wound/Ulcer Post Debridement: Improved Severity of Tissue Post Debridement: Fat layer exposed Post Procedure Diagnosis Same as Pre-procedure Electronic Signature(s) Signed: 08/19/2021 3:36:50 PM By: Kalman Shan DO Signed: 08/20/2021 5:46:28 PM By: Baruch Gouty RN, BSN Entered By: Baruch Gouty on 08/19/2021 15:22:45 -------------------------------------------------------------------------------- HPI Details Patient Name: Date of Service: [name] [name], [name] 08/19/2021 2:00 PM Medical Record Number: [id number] Patient Account Number: [id number] Date of Birth/Sex: Treating RN: [name] [name] (50 y.o. M) Primary Care Provider: Burt Ek,  Seth Bake Other Clinician: Referring Provider: Treating Provider/Extender: Delano Metz in Treatment: 60 History of Present Illness HPI Description: 04/21/18 ADMISSION This is a 50 year old man who is a type II diabetic. In spite of fact his hemoglobin A1c is actually quite good 5.73 months ago he is at a really difficult time over the last 6-7 months. He developed a rapidly progressive infection in the right foot in November 2018 associated with osteomyelitis and necrotizing fasciitis. 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 The stump required and a revision on 11/24/17. The stump revision was advertised as being secondary to falls. I'm not sure the progressive history here however this area is actually closed over. The patient tells me over the same timeframe he has had wounds on the plantar aspect of his left foot. In the ED saw Dr. Sharol Given in April of this year. 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 It is really not clear that this patient is been dressing this with anything. He came in with the clinic without any specific dressing on the wound areas using his own tennis shoe. He tells me that he only ambulates of course to do a pivot transfer. He does not have his  prosthesis for the right leg as of yet and he blames Medicaid for this. He does however use the foot to push himself along in his wheelchair at home. The patient has not had formal arterial studies. ABI in our clinic on the left was 1.13. Patient's past medical history includes type 2 diabetes, fracture of the right fibula. I see that he was treated for abscesses on his right buttock and chest in 2016. I did not look at the microbiology of this. 04/28/18; patient comes back in the clinic today with the wound pretty much the same as when he came in here last week. Small opening lots of undermining relatively. He tells [place] that nothing is really been on this for 3 days in spite of the fact that we gave him enough to dress this easily especially such a small wound. He says he lives with his mother, she is not capable of assisting with this he is changing the dressings himself. 05/05/18; much better-looking wound today. Smaller. There is some undermining medially however that's only perhaps 2 mm. No surrounding erythema. We've been using silver collagen 05/12/18; small wound on the first metatarsal head. No undermining no surrounding erythema. We've been using silver collagen he has home health coming out to change 05/20/18 the wound on the first metatarsal head looks better. Covered in surface debris/callus nonviable tissue. Required debridement but post debridement this looks quite good. We've been using silver collagen. He has home health 05/27/18; first metatarsal head wound continues to improve. Just about completely closed. Still a lot of surface debridement callus. We've been using silver collagen 06/06/18; the first metatarsal wound is completely healed over. There is still a lot of callus. I gently removed some of this just to make sure that there was no open area and there is not. The patient mentions to me that his left leg has felt like "lead" for about a week READMISSION 08/16/2020 This is a  50 year old man with type 2 diabetes. He returns to clinic today with a 1 month history of a blister and callus over the first metatarsal head. This is in exactly the same area as when he was here in 2019. He has a right BKA and a  prosthesis from a diabetic foot infection on the right in 2018. He has standard running shoes on the left foot to match the area in his prosthesis. He has only been covering this area with a Band-Aid has not really been specifically dressing this. ABI in our clinic on the left was 1.16. This is essentially stable from the value in 2019 10/1 the patient has a linear wound over the left first metatarsal head. Again not a lot of callus thick skin around this that I removed with a #3 curette. This is not go deep to bone or muscle it does not look to be infected. 10/8 first metatarsal head. The wound looks like it is closing however this is going to be a difficult area to offload in the future. He has a size 15 shoe and he says his shoes are years old. The inserts in these current shoes are totally useless and I have advised him to replace these. He [name] need a new pair of shoes and I have he got original prosthesis at [employer] [place] I have asked him to go back there. 11/9; the patient has not been here in more than a month. Not sure he was healed when he was here the last time. I told him he would have to get new shoes and certainly offload the area over the left first metatarsal head. He has done nothing of the kind. He arrives back in clinic with the same old new balance sneakers. A very large separating callus over the first metatarsal head on the left 11/16; he has purchased new shoes and has at least 2 insoles like I asked. The wound is measuring much smaller. He is using silver alginate he changes the dressing himself 11/30; wound is measurably smaller in length of about a half a centimeter. This is generally very little depth. We have been using silver alginate. He  changes the dressing himself every second day. 12/14; wound is about the same size. Significant undermining medially relative to the size of the wound. We have been using silver alginate. There is not a way to offload this area as he walks with a prosthesis on the right leg 12/28; wound is about the same size. Again he has undermining medially. I am using polymen on this 12/02/2020; the wound looks smaller. Still a senescent edge. We have been using polymen 1/24; the wound is come down a few millimeters. Still a senescent edge. I have been using polymen 2/8; the wound is come down slightly. Still with slight undermining from 12-6 o'clock I have been using polymen partially to get offloading on this area which is opposed by his prosthesis on the other leg. 2/22 quite open improvement he still has a comma shaped wound on the left first metatarsal head. Some depth. Using polymen 3/14; he still has the same problem of a comma shaped area over the left first metatarsal head. This seems to have had more depth today at 0.6 cm. We have been using polymen. The patient changes this himself every second day. I explored the idea of a total contact cast on the left leg however this is his driving foot. He was not enamored with the idea of not being able to drive and states that he does not have anybody else to get him around 3/28; again he comes in with a smaller looking wound however there is depth and undermining requiring debridement. We have been using Hydrofera Blue, changed to silver collagen today. The  patient is doing the dressing himself 4/11; patient presents today for follow-up of his left diabetic foot wound. He denies any issues in the past 2 weeks. He is currently using silver collagen every other day with dressing changes. He does not use diabetic shoes or special inserts to his sneakers. He does not use felt pads for offloading. 4/19; patient presents for his 1 week follow-up. He denies any  issues. He reports using Hydrofera Blue and has not been using silver collagen for dressing changes. He reports minimal drainage to the wound. He overall feels well. 5/3; patient presents for 2-week follow-up. He has been using silver collagen without issues. He has no complaints today and states he overall feels well. 5/17; patient presents for 2-week follow-up. He states that the sleeve is rubbing up against the back of his right leg and causing a wound. He states this happened a week ago and has not been dressing the wound. The plantar wound is stable and he has been using collagen every other day. 5/24; Patient was had a recent hospitalization for cellulitis of his right popliteal fossa. He was admitted and started on IV antibiotics. He does not recall the plantar wound dressed while inpatient. He feels a lot better today. He was discharged on 5/21 on oral antibiotics and has not picked this up until today. He reports using collagen to the plantar wound since discharge. He is keeping the right posterior knee wound covered. He is getting a new sleeve for his prostatic on 5/26. He currently denies signs of infection. 6/1; patient presents for 1 week follow-up. He is still on doxycycline. He has not been dressing the back of the right knee wound. He states he is receiving his new prosthetic sleeve tomorrow. He states he is using silver alginate to the plantar foot wound. He currently denies signs of infection. He states he has not been offloading the left foot wound 6/7; patient presents for 1 week follow-up. He completes his last dose of doxycycline today. He finally received his prosthetic sleeve for the right side and he states it feels much better. He has been using antibiotic ointment to the back of that right knee wound. He has been using silver alginate to the plantar foot wound. He currently denies signs of infection. 6/21; patient presents for 2-week follow-up. He reports no issues with his  prosthetic sleeve. He has been using antibiotic ointment to the back of the right knee. He has been using silver alginate to the plantar foot wound. He has no issues or complaints today. He denies signs of infection. 7/5; patient presents for 2-week follow-up. He reports no issues or complaints today. He continues to use antibiotic ointment to the right posterior knee wound and silver alginate to the left plantar foot wound. He denies signs of infection. 7/19; patient presents for 2-week follow-up. He continues to use antibiotic ointment to the right posterior knee and silver alginate to the left plantar foot wound. He has no issues or complaints today. He denies signs of infection. 8/4; patient presents for 2-week follow-up. He has no issues or complaints today. He denies signs of infection. 8/18; patient presents for 2-week follow-up. He has no issues or concerns today. He has been approved for Apligraf and he would like to have this placed today. He denies signs of infection and reports that his right posterior knee wound is closed. 8/25; patient presents for 1 week follow-up. He had no issues with his first Apligraf placement last week. He  did however develop an abscess to the back of his right knee. He reports tenderness to this area. He denies systemic signs of infection. 9/1; patient presents for 1 week follow-up. He has had no issues with his plantar foot wound. The Apligraf has stayed in place over the past week. A wound culture was done at last clinic visit and a new antibiotic was sent to the pharmacy. Patient has not received this yet. He reports some mild tenderness to the back of his right knee. He denies systemic signs of infection. 9/8; patient presents for 1 week follow-up. He states he took Keflex and had diarrhea as a side effect. He was able to complete the course. He reports continued tenderness to the right posterior knee wound. He has some drainage still. He denies increased  warmth or erythema to the area. He denies fever/chills or nausea/vomiting. He has no issues or complaints today about his plantar foot wound. 9/15; patient presents for follow-up. He reports improvement in pain to the back of his right leg. He does not report any drainage. He had an ultrasound completed on 9/9 that showed a superficial fluid collection concerning for abscess. We attempted to call patient with results with no answer. We did put an urgent referral to general surgery and he reports he has not received a call. He currently denies systemic signs of infection. He reports no issues to his left plantar foot wound. 9/27; patient presents for follow-up. He has no issues or complaints today. He has had no issues with the previous wound to the back of his right leg. He has not made an appointment to see general surgery. He had Apligraf placed at last clinic visit to the plantar foot wound. He currently denies signs of infection. Electronic Signature(s) Signed: 08/19/2021 3:36:50 PM By: Kalman Shan DO Entered By: Kalman Shan on 08/19/2021 15:31:24 -------------------------------------------------------------------------------- Physical Exam Details Patient Name: Date of Service: [name] [name], [name] 08/19/2021 2:00 PM Medical Record Number: [id number] Patient Account Number: [id number] Date of Birth/Sex: Treating RN: [birth year], [name] (50 y.o. M) Primary Care Provider: [name] Other Clinician: Referring Provider: Treating Provider/Extender: Delano Metz in Treatment: 22 Constitutional respirations regular, non-labored and within target range for patient.. Cardiovascular 2+ dorsalis pedis/posterior tibialis pulses. Psychiatric pleasant and cooperative. Notes Left first met head: Wound with granulation tissue present and circumferential callus. Electronic Signature(s) Signed: 08/19/2021 3:36:50 PM By: Kalman Shan DO Signed: 08/19/2021 3:36:50  PM By: Kalman Shan DO Entered By: Kalman Shan on 08/19/2021 15:32:17 -------------------------------------------------------------------------------- Physician Orders Details Patient Name: Date of Service: [name] [name], [name] 08/19/2021 2:00 PM Medical Record Number: [id number] Patient Account Number: [id number] Date of Birth/Sex: Treating RN: [birth year] [name] (50 y.o. [name] [name] Primary Care Provider: [name] Other Clinician: Referring Provider: Treating Provider/Extender: Delano Metz in Treatment: 8 Verbal / Phone Orders: No Diagnosis Coding ICD-10 Coding Code Description [phone] Non-pressure chronic ulcer of other part of left foot with fat layer exposed E11.621 Type 2 diabetes mellitus with foot ulcer S81.801D Unspecified open wound, right lower leg, subsequent encounter E11.42 Type 2 diabetes mellitus with diabetic polyneuropathy Z89.511 Acquired absence of right leg below knee Follow-up Appointments ppointment in 1 week. - with Dr. Heber Chandler, apligraf next week Return A Cellular or Tissue Based Products Wound #2R Left Metatarsal head first Cellular or Tissue Based Product Type: - Apligraf #6 -08/07/21: Approved for 5 more applications of Apligraf Cellular or Tissue Based Product applied to wound bed, secured with steri-strips, cover  with Adaptic or Mepitel. (DO NOT REMOVE). Edema Control - Lymphedema / SCD / Other Elevate legs to the level of the heart or above for 30 minutes daily and/or when sitting, a frequency of: Avoid standing for long periods of time. Wound Treatment Wound #2R - Metatarsal head first Wound Laterality: Left Cleanser: Soap and Water 1 x Per Week/15 Days Discharge Instructions: [name] shower and wash wound with dial antibacterial soap and water prior to dressing change. Cleanser: Wound Cleanser (Generic) 1 x Per Week/15 Days Discharge Instructions: Cleanse the wound with wound cleanser prior to applying a  clean dressing using gauze sponges, not tissue or cotton balls. Peri-Wound Care: Sween Lotion (Moisturizing lotion) 1 x Per Week/15 Days Discharge Instructions: Apply moisturizing lotion to leg Prim Dressing: Apligraf ary 1 x Per Week/15 Days Secondary Dressing: Woven Gauze Sponges 2x2 in (Generic) 1 x Per Week/15 Days Discharge Instructions: Apply over primary dressing as directed. Secondary Dressing: Optifoam Non-Adhesive Dressing, 4x4 in (Generic) 1 x Per Week/15 Days Discharge Instructions: Apply over primary dressing cut to make foam donut to help offload Secondary Dressing: ADAPTIC TOUCH 3x4.25 in 1 x Per Week/15 Days Discharge Instructions: Apply over skin sub, secured wit steristrips Secured With: Conforming Stretch Gauze Bandage, Sterile 2x75 (in/in) (Generic) 1 x Per Week/15 Days Discharge Instructions: Secure with stretch gauze as directed. Secured With: 41M Medipore H Soft Cloth Surgical Tape, 2x2 (in/yd) (Generic) 1 x Per Week/15 Days Discharge Instructions: Secure dressing with tape as directed. Electronic Signature(s) Signed: 08/19/2021 3:36:50 PM By: Kalman Shan DO Entered By: Kalman Shan on 08/19/2021 15:34:21 -------------------------------------------------------------------------------- Problem List Details Patient Name: Date of Service: [name] [name], [name] 08/19/2021 2:00 PM Medical Record Number: [id number] Patient Account Number: [id number] Date of Birth/Sex: Treating RN: [name][birth year] (50 y.o. [name] [name] Primary Care Provider: [name] Other Clinician: Referring Provider: Treating Provider/Extender: Delano Metz in Treatment: 71 Active Problems ICD-10 Encounter Code Description Active Date MDM Diagnosis [phone] Non-pressure chronic ulcer of other part of left foot with fat layer exposed 08/16/2020 No Yes E11.621 Type 2 diabetes mellitus with foot ulcer 08/16/2020 No Yes S81.801D Unspecified open wound, right  lower leg, subsequent encounter 04/08/2021 No Yes E11.42 Type 2 diabetes mellitus with diabetic polyneuropathy 08/16/2020 No Yes Z89.511 Acquired absence of right leg below knee 08/16/2020 No Yes Inactive Problems Resolved Problems Electronic Signature(s) Signed: 08/19/2021 3:36:50 PM By: Kalman Shan DO Entered By: Kalman Shan on 08/19/2021 15:29:49 -------------------------------------------------------------------------------- Progress Note Details Patient Name: Date of Service: [name] [name], [name] 08/19/2021 2:00 PM Medical Record Number: [id number] Patient Account Number: [id number] Date of Birth/Sex: Treating RN: [name][birth year] (49 y.o. M) Primary Care Provider: [name] Other Clinician: Referring Provider: Treating Provider/Extender: Delano Metz in Treatment: 45 Subjective Chief Complaint Information obtained from Patient patient is here for a review of the wound on his plantar left first metatarsal head and back of his right leg History of Present Illness (HPI) 04/21/18 ADMISSION This is a 50 year old man who is a type II diabetic. In spite of fact his hemoglobin A1c is actually quite good 5.73 months ago he is at a really difficult time over the last 6-7 months. He developed a rapidly progressive infection in the right foot in November 2018 associated with osteomyelitis and necrotizing fasciitis. 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 The stump required and a revision on 11/24/17. The stump revision was advertised as being secondary to falls. I'm not sure the progressive history here however  this area is actually closed over. The patient tells me over the same timeframe he has had wounds on the plantar aspect of his left foot. In the ED saw Dr. Sharol Given in April of this year. 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 It is really not clear that this patient is been dressing this with anything. He  came in with the clinic without any specific dressing on the wound areas using his own tennis shoe. He tells me that he only ambulates of course to do a pivot transfer. He does not have his prosthesis for the right leg as of yet and he blames Medicaid for this. He does however use the foot to push himself along in his wheelchair at home. The patient has not had formal arterial studies. ABI in our clinic on the left was 1.13. Patient's past medical history includes type 2 diabetes, fracture of the right fibula. I see that he was treated for abscesses on his right buttock and chest in 2016. I did not look at the microbiology of this. 04/28/18; patient comes back in the clinic today with the wound pretty much the same as when he came in here last week. Small opening lots of undermining relatively. He tells [place] that nothing is really been on this for 3 days in spite of the fact that we gave him enough to dress this easily especially such a small wound. He says he lives with his mother, she is not capable of assisting with this he is changing the dressings himself. 05/05/18; much better-looking wound today. Smaller. There is some undermining medially however that's only perhaps 2 mm. No surrounding erythema. We've been using silver collagen 05/12/18; small wound on the first metatarsal head. No undermining no surrounding erythema. We've been using silver collagen he has home health coming out to change 05/20/18 the wound on the first metatarsal head looks better. Covered in surface debris/callus nonviable tissue. Required debridement but post debridement this looks quite good. We've been using silver collagen. He has home health 05/27/18; first metatarsal head wound continues to improve. Just about completely closed. Still a lot of surface debridement callus. We've been using silver collagen 06/06/18; the first metatarsal wound is completely healed over. There is still a lot of callus. I gently removed some of  this just to make sure that there was no open area and there is not. The patient mentions to me that his left leg has felt like "lead" for about a week READMISSION 08/16/2020 This is a 50 year old man with type 2 diabetes. He returns to clinic today with a 1 month history of a blister and callus over the first metatarsal head. This is in exactly the same area as when he was here in 2019. 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 He has standard running shoes on the left foot to match the area in his prosthesis. He has only been covering this area with a Band-Aid has not really been specifically dressing this. ABI in our clinic on the left was 1.16. This is essentially stable from the value in 2019 10/1 the patient has a linear wound over the left first metatarsal head. Again not a lot of callus thick skin around this that I removed with a #3 curette. This is not go deep to bone or muscle it does not look to be infected. 10/8 first metatarsal head. The wound  looks like it is closing however this is going to be a difficult area to offload in the future. He has a size 15 shoe and he says his shoes are years old. The inserts in these current shoes are totally useless and I have advised him to replace these. He [name] need a new pair of shoes and I have he got original prosthesis at [facility] the [place] I have asked him to go back there. 11/9; the patient has not been here in more than a month. Not sure he was healed when he was here the last time. I told him he would have to get new shoes and certainly offload the area over the left first metatarsal head. He has done nothing of the kind. He arrives back in clinic with the same old new balance sneakers. A very large separating callus over the first metatarsal head on the left 11/16; he has purchased new shoes and has at least 2 insoles like I asked. The wound is measuring much smaller. He is using silver  alginate he changes the dressing himself 11/30; wound is measurably smaller in length of about a half a centimeter. This is generally very little depth. We have been using silver alginate. He changes the dressing himself every second day. 12/14; wound is about the same size. Significant undermining medially relative to the size of the wound. We have been using silver alginate. There is not a way to offload this area as he walks with a prosthesis on the right leg 12/28; wound is about the same size. Again he has undermining medially. I am using polymen on this 12/02/2020; the wound looks smaller. Still a senescent edge. We have been using polymen 1/24; the wound is come down a few millimeters. Still a senescent edge. I have been using polymen 2/8; the wound is come down slightly. Still with slight undermining from 12-6 o'clock I have been using polymen partially to get offloading on this area which is opposed by his prosthesis on the other leg. 2/22 quite open improvement he still has a comma shaped wound on the left first metatarsal head. Some depth. Using polymen 3/14; he still has the same problem of a comma shaped area over the left first metatarsal head. This seems to have had more depth today at 0.6 cm. We have been using polymen. The patient changes this himself every second day. I explored the idea of a total contact cast on the left leg however this is his driving foot. He was not enamored with the idea of not being able to drive and states that he does not have anybody else to get him around 3/28; again he comes in with a smaller looking wound however there is depth and undermining requiring debridement. We have been using Hydrofera Blue, changed to silver collagen today. The patient is doing the dressing himself 4/11; patient presents today for follow-up of his left diabetic foot wound. He denies any issues in the past 2 weeks. He is currently using silver collagen every other day with  dressing changes. He does not use diabetic shoes or special inserts to his sneakers. He does not use felt pads for offloading. 4/19; patient presents for his 1 week follow-up. He denies any issues. He reports using Hydrofera Blue and has not been using silver collagen for dressing changes. He reports minimal drainage to the wound. He overall feels well. 5/3; patient presents for 2-week follow-up. He has been using silver collagen without issues. He has  no complaints today and states he overall feels well. 5/17; patient presents for 2-week follow-up. He states that the sleeve is rubbing up against the back of his right leg and causing a wound. He states this happened a week ago and has not been dressing the wound. The plantar wound is stable and he has been using collagen every other day. 5/24; Patient was had a recent hospitalization for cellulitis of his right popliteal fossa. He was admitted and started on IV antibiotics. He does not recall the plantar wound dressed while inpatient. He feels a lot better today. He was discharged on 5/21 on oral antibiotics and has not picked this up until today. He reports using collagen to the plantar wound since discharge. He is keeping the right posterior knee wound covered. He is getting a new sleeve for his prostatic on 5/26. He currently denies signs of infection. 6/1; patient presents for 1 week follow-up. He is still on doxycycline. He has not been dressing the back of the right knee wound. He states he is receiving his new prosthetic sleeve tomorrow. He states he is using silver alginate to the plantar foot wound. He currently denies signs of infection. He states he has not been offloading the left foot wound 6/7; patient presents for 1 week follow-up. He completes his last dose of doxycycline today. He finally received his prosthetic sleeve for the right side and he states it feels much better. He has been using antibiotic ointment to the back of that right  knee wound. He has been using silver alginate to the plantar foot wound. He currently denies signs of infection. 6/21; patient presents for 2-week follow-up. He reports no issues with his prosthetic sleeve. He has been using antibiotic ointment to the back of the right knee. He has been using silver alginate to the plantar foot wound. He has no issues or complaints today. He denies signs of infection. 7/5; patient presents for 2-week follow-up. He reports no issues or complaints today. He continues to use antibiotic ointment to the right posterior knee wound and silver alginate to the left plantar foot wound. He denies signs of infection. 7/19; patient presents for 2-week follow-up. He continues to use antibiotic ointment to the right posterior knee and silver alginate to the left plantar foot wound. He has no issues or complaints today. He denies signs of infection. 8/4; patient presents for 2-week follow-up. He has no issues or complaints today. He denies signs of infection. 8/18; patient presents for 2-week follow-up. He has no issues or concerns today. He has been approved for Apligraf and he would like to have this placed today. He denies signs of infection and reports that his right posterior knee wound is closed. 8/25; patient presents for 1 week follow-up. He had no issues with his first Apligraf placement last week. He did however develop an abscess to the back of his right knee. He reports tenderness to this area. He denies systemic signs of infection. 9/1; patient presents for 1 week follow-up. He has had no issues with his plantar foot wound. The Apligraf has stayed in place over the past week. A wound culture was done at last clinic visit and a new antibiotic was sent to the pharmacy. Patient has not received this yet. He reports some mild tenderness to the back of his right knee. He denies systemic signs of infection. 9/8; patient presents for 1 week follow-up. He states he took Keflex  and had diarrhea as a side effect. He  was able to complete the course. He reports continued tenderness to the right posterior knee wound. He has some drainage still. He denies increased warmth or erythema to the area. He denies fever/chills or nausea/vomiting. He has no issues or complaints today about his plantar foot wound. 9/15; patient presents for follow-up. He reports improvement in pain to the back of his right leg. He does not report any drainage. He had an ultrasound completed on 9/9 that showed a superficial fluid collection concerning for abscess. We attempted to call patient with results with no answer. We did put an urgent referral to general surgery and he reports he has not received a call. He currently denies systemic signs of infection. He reports no issues to his left plantar foot wound. 9/27; patient presents for follow-up. He has no issues or complaints today. He has had no issues with the previous wound to the back of his right leg. He has not made an appointment to see general surgery. He had Apligraf placed at last clinic visit to the plantar foot wound. He currently denies signs of infection. Patient History Information obtained from Patient. Family History Diabetes - Maternal Grandparents,Mother, Heart Disease - Father, Hypertension - Father, No family history of Cancer, Hereditary Spherocytosis, Kidney Disease, Lung Disease, Seizures, Stroke, Thyroid Problems, Tuberculosis. Social History Current every day smoker, Marital Status - Single, Alcohol Use - Never, Drug Use - No History, Caffeine Use - Never. Medical History Eyes Denies history of Cataracts, Glaucoma, Optic Neuritis Ear/Nose/Mouth/Throat Denies history of Chronic sinus problems/congestion, Middle ear problems Hematologic/Lymphatic Patient has history of Anemia Denies history of Hemophilia, Human Immunodeficiency Virus, Lymphedema, Sickle Cell Disease Respiratory Denies history of Aspiration, Asthma,  Chronic Obstructive Pulmonary Disease (COPD), Pneumothorax, Sleep Apnea, Tuberculosis Cardiovascular Patient has history of Hypertension, Peripheral Venous Disease Denies history of Angina, Arrhythmia, Congestive Heart Failure, Coronary Artery Disease, Deep Vein Thrombosis, Hypotension, Myocardial Infarction, Peripheral Arterial Disease, Phlebitis, Vasculitis Gastrointestinal Denies history of Cirrhosis , Colitis, Crohnoos, Hepatitis A, Hepatitis B, Hepatitis C Endocrine Patient has history of Type II Diabetes - oral meds Denies history of Type I Diabetes Genitourinary Denies history of End Stage Renal Disease Immunological Denies history of Lupus Erythematosus, Raynaudoos, Scleroderma Integumentary (Skin) Denies history of History of Burn Musculoskeletal Denies history of Gout, Rheumatoid Arthritis, Osteoarthritis, Osteomyelitis Neurologic Denies history of Dementia, Neuropathy, Quadriplegia, Paraplegia, Seizure Disorder Oncologic Denies history of Received Chemotherapy, Received Radiation Psychiatric Denies history of Anorexia/bulimia, Confinement Anxiety Hospitalization/Surgery History - fever 105, sepsis cellulitis right popliteal fossa 04/09/2021. Medical A Surgical History Notes [name] Constitutional Symptoms (General Health) falls , leukocytosis Respiratory During waking hours and catches himself not breathing, "gasp for air". Saw Dr Wynonia Lawman, he couldn't find a reason Objective Constitutional respirations regular, non-labored and within target range for patient.. Vitals Time Taken: 2:33 PM, Height: 77 in, Weight: 232 lbs, BMI: 27.5, Temperature: 98.2 F, Pulse: 78 bpm, Respiratory Rate: 17 breaths/min, Blood Pressure: 126/84 mmHg. Cardiovascular 2+ dorsalis pedis/posterior tibialis pulses. Psychiatric pleasant and cooperative. General Notes: Left first met head: Wound with granulation tissue present and circumferential callus. Integumentary (Hair, Skin) Wound #2R status  is Open. Original cause of wound was Blister. The date acquired was: 06/23/2020. The wound has been in treatment 52 weeks. The wound is located on the Left Metatarsal head first. The wound measures 0.4cm length x 0.2cm width x 0.3cm depth; 0.063cm^2 area and 0.019cm^3 volume. There is Fat Layer (Subcutaneous Tissue) exposed. There is no tunneling noted, however, there is undermining starting at 12:00 and ending  at 12:00 with a maximum distance of 0.5cm. There is a small amount of serosanguineous drainage noted. The wound margin is well defined and not attached to the wound base. There is large (67-100%) red granulation within the wound bed. There is no necrotic tissue within the wound bed. 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 There is significant circumferential callus. I debrided nonviable tissue. No signs of infection on exam. Apligraf #7 was placed in standard fashion today. We will give him the number to general surgery for follow-up of his ultrasound findings. Procedures Wound #2R Pre-procedure diagnosis of Wound #2R is a Diabetic Wound/Ulcer of the Lower Extremity located on the Left Metatarsal head first .Severity of Tissue Pre Debridement is: Fat layer exposed. There was a Selective/Open Wound Skin/Epidermis Debridement with a total area of 0.49 sq cm performed by Kalman Shan, DO. With the following instrument(s): Curette to remove Non-Viable tissue/material. Material removed includes Callus and Skin: Epidermis and. No specimens were taken. A time out was conducted at 15:15, prior to the start of the procedure. A Minimum amount of bleeding was controlled with Pressure. The procedure was  tolerated well with a pain level of 0 throughout and a pain level of 0 following the procedure. Post Debridement Measurements: 0.4cm length x 0.3cm width x 0.3cm depth; 0.028cm^3 volume. Character of Wound/Ulcer Post Debridement is improved. Severity of Tissue Post Debridement is: Fat layer exposed. Post procedure Diagnosis Wound #2R: Same as Pre-Procedure Pre-procedure diagnosis of Wound #2R is a Diabetic Wound/Ulcer of the Lower Extremity located on the Left Metatarsal head first. A skin graft procedure using a bioengineered skin substitute/cellular or tissue based product was performed by Kalman Shan, DO with the following instrument(s): Blade, Forceps, and Scissors. Apligraf was applied and secured with Steri-Strips. 11 sq cm of product was utilized and 33 sq cm was wasted due to wound size. Post Application, adaptic, gauze was applied. A Time Out was conducted at 15:23, prior to the start of the procedure. The procedure was tolerated well with a pain level of 0 throughout and a pain level of 0 following the procedure. Post procedure Diagnosis Wound #2R: Same as Pre-Procedure . Plan Follow-up Appointments: Return Appointment in 1 week. - with Dr. Heber [place], apligraf next week Cellular or Tissue Based Products: Wound #2R Left Metatarsal head first: Cellular or Tissue Based Product Type: - Apligraf #6 -08/07/21: Approved for 5 more applications of Apligraf Cellular or Tissue Based Product applied to wound bed, secured with steri-strips, cover with Adaptic or Mepitel. (DO NOT REMOVE). Edema Control - Lymphedema / SCD / Other: Elevate legs to the level of the heart or above for 30 minutes daily and/or when sitting, a frequency of: Avoid standing for long periods of time. WOUND #2R: - Metatarsal head first Wound Laterality: Left Cleanser: Soap and Water 1 x Per Week/15 Days Discharge Instructions: [name] shower and wash wound with dial antibacterial soap and water prior to dressing  change. Cleanser: Wound Cleanser (Generic) 1 x Per Week/15 Days Discharge Instructions: Cleanse the wound with wound cleanser prior to applying a clean dressing using gauze sponges, not tissue or cotton balls. Peri-Wound Care: Sween Lotion (Moisturizing lotion) 1 x Per Week/15 Days Discharge Instructions: Apply moisturizing lotion to leg Prim Dressing: Apligraf 1  x Per Week/15 Days ary Secondary Dressing: Woven Gauze Sponges 2x2 in (Generic) 1 x Per Week/15 Days Discharge Instructions: Apply over primary dressing as directed. Secondary Dressing: Optifoam Non-Adhesive Dressing, 4x4 in (Generic) 1 x Per Week/15 Days Discharge Instructions: Apply over primary dressing cut to make foam donut to help offload Secondary Dressing: ADAPTIC TOUCH 3x4.25 in 1 x Per Week/15 Days Discharge Instructions: Apply over skin sub, secured wit steristrips Secured With: Conforming Stretch Gauze Bandage, Sterile 2x75 (in/in) (Generic) 1 x Per Week/15 Days Discharge Instructions: Secure with stretch gauze as directed. Secured With: 63M Medipore H Soft Cloth Surgical T ape, 2x2 (in/yd) (Generic) 1 x Per Week/15 Days Discharge Instructions: Secure dressing with tape as directed. 1. In office sharp debridement 2. Apligraf #7 placed in standard fashion 3. Follow-up in 1 week Electronic Signature(s) Signed: 08/19/2021 3:36:50 PM By: Kalman Shan DO Entered By: Kalman Shan on 08/19/2021 15:36:06 -------------------------------------------------------------------------------- HxROS Details Patient Name: Date of Service: [name] [name], [name] 08/19/2021 2:00 PM Medical Record Number: [id number] Patient Account Number: [id number] Date of Birth/Sex: Treating RN: [name] (50 y.o. M) Primary Care Provider: [name] Other Clinician: Referring Provider: Treating Provider/Extender: Delano Metz in Treatment: 15 Information Obtained From Patient Constitutional Symptoms (General  Health) Medical History: Past Medical History Notes: falls , leukocytosis Eyes Medical History: Negative for: Cataracts; Glaucoma; Optic Neuritis Ear/Nose/Mouth/Throat Medical History: Negative for: Chronic sinus problems/congestion; Middle ear problems Hematologic/Lymphatic Medical History: Positive for: Anemia Negative for: Hemophilia; Human Immunodeficiency Virus; Lymphedema; Sickle Cell Disease Respiratory Medical History: Negative for: Aspiration; Asthma; Chronic Obstructive Pulmonary Disease (COPD); Pneumothorax; Sleep Apnea; Tuberculosis Past Medical History Notes: During waking hours and catches himself not breathing, "gasp for air". Saw Dr Wynonia Lawman, he couldn't find a reason Cardiovascular Medical History: Positive for: Hypertension; Peripheral Venous Disease Negative for: Angina; Arrhythmia; Congestive Heart Failure; Coronary Artery Disease; Deep Vein Thrombosis; Hypotension; Myocardial Infarction; Peripheral Arterial Disease; Phlebitis; Vasculitis Gastrointestinal Medical History: Negative for: Cirrhosis ; Colitis; Crohns; Hepatitis A; Hepatitis B; Hepatitis C Endocrine Medical History: Positive for: Type II Diabetes - oral meds Negative for: Type I Diabetes Time with diabetes: 2014 Treated with: Oral agents Blood sugar tested every day: Yes Tested : Blood sugar testing results: Breakfast: 91 Genitourinary Medical History: Negative for: End Stage Renal Disease Immunological Medical History: Negative for: Lupus Erythematosus; Raynauds; Scleroderma Integumentary (Skin) Medical History: Negative for: History of Burn Musculoskeletal Medical History: Negative for: Gout; Rheumatoid Arthritis; Osteoarthritis; Osteomyelitis Neurologic Medical History: Negative for: Dementia; Neuropathy; Quadriplegia; Paraplegia; Seizure Disorder Oncologic Medical History: Negative for: Received Chemotherapy; Received Radiation Psychiatric Medical History: Negative for:  Anorexia/bulimia; Confinement Anxiety Immunizations Pneumococcal Vaccine: Received Pneumococcal Vaccination: No Implantable Devices No devices added Hospitalization / Surgery History Type of Hospitalization/Surgery fever 105, sepsis cellulitis right popliteal fossa 04/09/2021 Family and Social History Cancer: No; Diabetes: Yes - Maternal Grandparents,Mother; Heart Disease: Yes - Father; Hereditary Spherocytosis: No; Hypertension: Yes - Father; Kidney Disease: No; Lung Disease: No; Seizures: No; Stroke: No; Thyroid Problems: No; Tuberculosis: No; Current every day smoker; Marital Status - Single; Alcohol Use: Never; Drug Use: No History; Caffeine Use: Never; Financial Concerns: No; Food, Clothing or Shelter Needs: No; Support System Lacking: No; Transportation Concerns: No Electronic Signature(s) Signed: 08/19/2021 3:36:50 PM By: Kalman Shan DO Entered By: Kalman Shan on 08/19/2021 15:31:33 -------------------------------------------------------------------------------- SuperBill Details Patient Name: Date of Service: [name] [name], [name] 08/19/2021 Medical Record Number: [id number] Patient Account Number: [id number] Date of Birth/Sex: Treating RN: [birth year] (50 y.o. [name] [name] Primary  Care Provider: [name] Other Clinician: Referring Provider: Treating Provider/Extender: Delano Metz in Treatment: 52 Diagnosis Coding ICD-10 Codes Code Description [phone] Non-pressure chronic ulcer of other part of left foot with fat layer exposed E11.621 Type 2 diabetes mellitus with foot ulcer S81.801D Unspecified open wound, right lower leg, subsequent encounter E11.42 Type 2 diabetes mellitus with diabetic polyneuropathy Z89.511 Acquired absence of right leg below knee Facility Procedures CPT4 Code: 63016010 Description: (Facility Use Only) Apligraf 1 SQ CM Modifier: Quantity: 4 CPT4 Code: [id number] Description: 22025 - SKIN SUB GRAFT  FACE/NK/HF/G ICD-10 Diagnosis Description L97.522 Non-pressure chronic ulcer of other part of left foot with fat layer exposed Modifier: Quantity: 1 Physician Procedures : CPT4 Code Description Modifier [id number] - WC PHYS SKIN SUB GRAFT FACE/NK/HF/G ICD-10 Diagnosis Description L97.522 Non-pressure chronic ulcer of other part of left foot with fat layer exposed Quantity: 1 Electronic Signature(s) Signed: 08/19/2021 3:36:50 PM By: Kalman Shan DO Entered By: Kalman Shan on 08/19/2021 15:36:14

## 2021-08-20 NOTE — Progress Notes (Signed)
BECKAM, ABDULAZIZ ([id number]) Visit Report for 08/19/2021 Arrival Information Details Patient Name: Date of Service: [name] 08/19/2021 2:00 PM Medical Record Number: [id number] Patient Account Number: [id number] Date of Birth/Sex: Treating RN: Mar 26, 1971 (50 y.o. M) Primary Care Yanett Conkright: [name] Other Clinician: Referring Jayin Derousse: Treating Skarlette Lattner/Extender: Delano Metz in Treatment: 68 Visit Information History Since Last Visit Added or deleted any medications: No Patient Arrived: Ambulatory Any new allergies or adverse reactions: No Arrival Time: 14:32 Had a fall or experienced change in No Accompanied By: self activities of daily living that may affect Transfer Assistance: None risk of falls: Patient Identification Verified: Yes Signs or symptoms of abuse/neglect since last visito No Secondary Verification Process Completed: Yes Hospitalized since last visit: No Patient Requires Transmission-Based Precautions: No Implantable device outside of the clinic excluding No Patient Has Alerts: No cellular tissue based products placed in the center since last visit: Has Dressing in Place as Prescribed: Yes Pain Present Now: No Electronic Signature(s) Signed: 08/20/2021 11:11:37 AM By: Sandre Kitty Entered By: Sandre Kitty on 08/19/2021 14:32:57 -------------------------------------------------------------------------------- Encounter Discharge Information Details Patient Name: Date of Service: [name], [name] 08/19/2021 2:00 PM Medical Record Number: [id number] Patient Account Number: [id number] Date of Birth/Sex: Treating RN: 03-08-1971 (50 y.o. [name] Primary Care Rayana Geurin: [name] Other Clinician: Referring Hayzen Lorenson: Treating Tiombe Tomeo/Extender: Delano Metz in Treatment: 69 Encounter Discharge Information Items Post Procedure Vitals Discharge Condition:  Stable Temperature (F): 98.2 Ambulatory Status: Ambulatory Pulse (bpm): 78 Discharge Destination: Home Respiratory Rate (breaths/min): 18 Transportation: Private Auto Blood Pressure (mmHg): 126/84 Accompanied By: self Schedule Follow-up Appointment: Yes Clinical Summary of Care: Patient Declined Electronic Signature(s) Signed: 08/20/2021 5:46:28 PM By: Baruch Gouty RN, BSN Entered By: Baruch Gouty on 08/19/2021 15:41:44 -------------------------------------------------------------------------------- Lower Extremity Assessment Details Patient Name: Date of Service: [name], [name] 08/19/2021 2:00 PM Medical Record Number: [id number] Patient Account Number: [id number] Date of Birth/Sex: Treating RN: 1971/09/10 (50 y.o. [name] Primary Care Orva Riles: [name] Other Clinician: Referring Jeslynn Hollander: Treating Cintia Gleed/Extender: Delano Metz in Treatment: 52 Edema Assessment Assessed: [name]: No] [Right: No] Edema: [Left: N] [Right: o] Calf Left: Right: Point of Measurement: 50 cm From Medial Instep 39.8 cm Ankle Left: Right: Point of Measurement: 11 cm From Medial Instep 21.2 cm Vascular Assessment Pulses: Dorsalis Pedis Palpable: [Left:Yes] Electronic Signature(s) Signed: 08/20/2021 5:46:28 PM By: Baruch Gouty RN, BSN Entered By: Baruch Gouty on 08/19/2021 14:56:50 -------------------------------------------------------------------------------- Multi Wound Chart Details Patient Name: Date of Service: [name], [name] 08/19/2021 2:00 PM Medical Record Number: [id number] Patient Account Number: [id number] Date of Birth/Sex: Treating RN: Dec 06, 1970 (50 y.o. M) Primary Care Tosca Pletz: [name] Other Clinician: Referring Florene Brill: Treating Cletus Paris/Extender: Delano Metz in Treatment: 57 Vital Signs Height(in): 77 Pulse(bpm): 78 Weight(lbs): 232 Blood Pressure(mmHg):  126/84 Body Mass Index(BMI): 28 Temperature(F): 98.2 Respiratory Rate(breaths/min): 17 Photos: [2R:No Photos Left Metatarsal head first] [N/A:N/A N/A] Wound Location: [2R:Blister] [N/A:N/A] Wounding Event: [2R:Diabetic Wound/Ulcer of the Lower] [N/A:N/A] Primary Etiology: [2R:Extremity Anemia, Hypertension, Peripheral] [N/A:N/A] Comorbid History: [2R:Venous Disease, Type II Diabetes 06/23/2020] [N/A:N/A] Date Acquired: [2R:52] [N/A:N/A] Weeks of Treatment: [2R:Open] [N/A:N/A] Wound Status: [2R:Yes] [N/A:N/A] Wound Recurrence: [2R:0.4x0.2x0.3] [N/A:N/A] Measurements L x W x D (cm) [2R:0.063] [N/A:N/A] A (cm) : rea [9F:0.263] [N/A:N/A] Volume (cm) : [2R:-103.20%] [N/A:N/A] % Reduction in A rea: [2R:-533.30%] [N/A:N/A] % Reduction in Volume: [2R:12] Starting Position 1 (o'clock): [2R:12] Ending Position 1 (o'clock): [2R:0.5] Maximum Distance  1 (cm): [2R:Yes] [N/A:N/A] Undermining: [2R:Grade 2] [N/A:N/A] Classification: [2R:Small] [N/A:N/A] Exudate A mount: [2R:Serosanguineous] [N/A:N/A] Exudate Type: [2R:red, brown] [N/A:N/A] Exudate Color: [2R:Well defined, not attached] [N/A:N/A] Wound Margin: [2R:Large (67-100%)] [N/A:N/A] Granulation A mount: [2R:Red] [N/A:N/A] Granulation Quality: [2R:None Present (0%)] [N/A:N/A] Necrotic A mount: [2R:Fat Layer (Subcutaneous Tissue): Yes N/A] Exposed Structures: [2R:Fascia: No Tendon: No Muscle: No Joint: No Bone: No None] [N/A:N/A] Epithelialization: [2R:Debridement - Selective/Open Wound N/A] Debridement: Pre-procedure Verification/Time Out 15:15 [N/A:N/A] Taken: [2R:Callus] [N/A:N/A] Tissue Debrided: [2R:Skin/Epidermis] [N/A:N/A] Level: [2R:0.49] [N/A:N/A] Debridement A (sq cm): [2R:rea Curette] [N/A:N/A] Instrument: [2R:Minimum] [N/A:N/A] Bleeding: [2R:Pressure] [N/A:N/A] Hemostasis A chieved: [2R:0] [N/A:N/A] Procedural Pain: [2R:0] [N/A:N/A] Post Procedural Pain: [2R:Procedure was tolerated well] [N/A:N/A] Debridement Treatment  Response: [2R:0.4x0.3x0.3] [N/A:N/A] Post Debridement Measurements L x W x D (cm) [2R:0.028] [N/A:N/A] Post Debridement Volume: (cm) [2R:Cellular or Tissue Based Product] [N/A:N/A] Procedures Performed: [2R:Debridement] Treatment Notes Electronic Signature(s) Signed: 08/19/2021 3:36:50 PM By: Kalman Shan DO Entered By: Kalman Shan on 08/19/2021 15:29:56 -------------------------------------------------------------------------------- Multi-Disciplinary Care Plan Details Patient Name: Date of Service: [name], [name] 08/19/2021 2:00 PM Medical Record Number: [id number] Patient Account Number: [id number] Date of Birth/Sex: Treating RN: 01-06-1971 (50 y.o. [name] Primary Care Grier Vu: [name] Other Clinician: Referring Chanee Henrickson: Treating Naidelyn Parrella/Extender: Delano Metz in Treatment: 32 Multidisciplinary Care Plan reviewed with physician Active Inactive Wound/Skin Impairment Nursing Diagnoses: Impaired tissue integrity Goals: Patient/caregiver will verbalize understanding of skin care regimen Date Initiated: 11/19/2020 Target Resolution Date: 09/16/2021 Goal Status: Active Ulcer/skin breakdown will have a volume reduction of 30% by week 4 Date Initiated: 08/16/2020 Date Inactivated: 03/03/2021 Target Resolution Date: 03/10/2021 Goal Status: Met Interventions: Provide education on ulcer and skin care Notes: 07/24/21: Wound care regimen ongoing. Electronic Signature(s) Signed: 08/20/2021 5:46:28 PM By: Baruch Gouty RN, BSN Entered By: Baruch Gouty on 08/19/2021 14:58:22 -------------------------------------------------------------------------------- Pain Assessment Details Patient Name: Date of Service: [name], [name] 08/19/2021 2:00 PM Medical Record Number: [id number] Patient Account Number: [id number] Date of Birth/Sex: Treating RN: Mar 28, 1971 (51 y.o. M) Primary Care Kirk Sampley: [name] Other  Clinician: Referring Cameka Rae: Treating Yazaira Speas/Extender: Delano Metz in Treatment: 53 Active Problems Location of Pain Severity and Description of Pain Patient Has Paino No Site Locations Pain Management and Medication Current Pain Management: Electronic Signature(s) Signed: 08/20/2021 11:11:37 AM By: Sandre Kitty Entered By: Sandre Kitty on 08/19/2021 14:33:22 -------------------------------------------------------------------------------- Patient/Caregiver Education Details Patient Name: Date of Service: [name], Payton Doughty 9/27/2022andnbsp2:00 PM Medical Record Number: [id number] Patient Account Number: [id number] Date of Birth/Gender: Treating RN: [birth year] (50 y.o. [name] Primary Care Physician: [name] Other Clinician: Referring Physician: Treating Physician/Extender: Delano Metz in Treatment: 78 Education Assessment Education Provided To: Patient Education Topics Provided Offloading: Methods: Explain/Verbal Responses: Reinforcements needed, State content correctly Wound/Skin Impairment: Methods: Explain/Verbal Responses: Reinforcements needed, State content correctly Electronic Signature(s) Signed: 08/20/2021 5:46:28 PM By: Baruch Gouty RN, BSN Entered By: Baruch Gouty on 08/19/2021 14:58:44 -------------------------------------------------------------------------------- Wound Assessment Details Patient Name: Date of Service: [name], [name] 08/19/2021 2:00 PM Medical Record Number: [id number] Patient Account Number: [id number] Date of Birth/Sex: Treating RN: Jan 25, 1971 (50 y.o. [name] Primary Care Heather Streeper: [name] Other Clinician: Referring Levaughn Puccinelli: Treating Brittain Hosie/Extender: Delano Metz in Treatment: 7 Wound Status Wound Number: 2R Primary Diabetic Wound/Ulcer of the Lower Extremity Etiology: Wound  Location: Left Metatarsal head first Wound Status: Open Wounding Event: Blister Comorbid Anemia, Hypertension, Peripheral Venous Disease, Type II Date Acquired: 06/23/2020  History: Diabetes Weeks Of Treatment: 52 Clustered Wound: No Wound Measurements Length: (cm) 0.4 Width: (cm) 0.2 Depth: (cm) 0.3 Area: (cm) 0.063 Volume: (cm) 0.019 % Reduction in Area: -103.2% % Reduction in Volume: -533.3% Epithelialization: None Tunneling: No Undermining: Yes Starting Position (o'clock): 12 Ending Position (o'clock): 12 Maximum Distance: (cm) 0.5 Wound Description Classification: Grade 2 Wound Margin: Well defined, not attached Exudate Amount: Small Exudate Type: Serosanguineous Exudate Color: red, brown Foul Odor After Cleansing: No Slough/Fibrino No Wound Bed Granulation Amount: Large (67-100%) Exposed Structure Granulation Quality: Red Fascia Exposed: No Necrotic Amount: None Present (0%) Fat Layer (Subcutaneous Tissue) Exposed: Yes Tendon Exposed: No Muscle Exposed: No Joint Exposed: No Bone Exposed: No Treatment Notes Wound #2R (Metatarsal head first) Wound Laterality: Left Cleanser Soap and Water Discharge Instruction: May shower and wash wound with dial antibacterial soap and water prior to dressing change. Wound Cleanser Discharge Instruction: Cleanse the wound with wound cleanser prior to applying a clean dressing using gauze sponges, not tissue or cotton balls. Peri-Wound Care Sween Lotion (Moisturizing lotion) Discharge Instruction: Apply moisturizing lotion to leg Topical Primary Dressing Apligraf Secondary Dressing Woven Gauze Sponges 2x2 in Discharge Instruction: Apply over primary dressing as directed. Optifoam Non-Adhesive Dressing, 4x4 in Discharge Instruction: Apply over primary dressing cut to make foam donut to help offload ADAPTIC TOUCH 3x4.25 in Discharge Instruction: Apply over skin sub, secured wit steristrips Secured With Conforming Stretch  Gauze Bandage, Sterile 2x75 (in/in) Discharge Instruction: Secure with stretch gauze as directed. 42M Medipore H Soft Cloth Surgical T ape, 2x2 (in/yd) Discharge Instruction: Secure dressing with tape as directed. Compression Wrap Compression Stockings Add-Ons Electronic Signature(s) Signed: 08/20/2021 5:46:28 PM By: Baruch Gouty RN, BSN Entered By: Baruch Gouty on 08/19/2021 14:57:48 -------------------------------------------------------------------------------- Vitals Details Patient Name: Date of Service: [name], [name] 08/19/2021 2:00 PM Medical Record Number: [id number] Patient Account Number: [id number] Date of Birth/Sex: Treating RN: 01/20/1971 (50 y.o. M) Primary Care Armella Stogner: [name] Other Clinician: Referring Lavergne Hiltunen: Treating Valta Dillon/Extender: Delano Metz in Treatment: 35 Vital Signs Time Taken: 14:33 Temperature (F): 98.2 Height (in): 77 Pulse (bpm): 78 Weight (lbs): 232 Respiratory Rate (breaths/min): 17 Body Mass Index (BMI): 27.5 Blood Pressure (mmHg): 126/84 Reference Range: 80 - 120 mg / dl Electronic Signature(s) Signed: 08/20/2021 11:11:37 AM By: Sandre Kitty Entered By: Sandre Kitty on 08/19/2021 14:33:16

## 2021-08-20 NOTE — Progress Notes (Signed)
MICHARL, HELMES ([id number]) Visit Report for 08/19/2021 Wound Assessment Details Patient Name: Date of Service: [name] 08/19/2021 1:45 PM Medical Record Number: [id number] Patient Account Number: [id number] Date of Birth/Sex: Treating RN: 11/16/1971 (50 y.o. [name], [name] Primary Care Jamee Pacholski: [name] Other Clinician: Referring Rontavious Albright: Treating Agamjot Kilgallon/Extender: Delano Metz in Treatment: 32 Wound Status Wound Number: 2R Primary Diabetic Wound/Ulcer of the Lower Extremity Etiology: Wound Location: Left Metatarsal head first Wound Status: Open Wounding Event: Blister Comorbid Anemia, Hypertension, Peripheral Venous Disease, Type II Date Acquired: 06/23/2020 History: Diabetes Weeks Of Treatment: 52 Clustered Wound: No Photos Wound Measurements Length: (cm) 0.4 % Reduction in Area: -103.2% Width: (cm) 0.2 % Reduction in Volume: -533.3% Depth: (cm) 0.3 Epithelialization: None Area: (cm) 0.063 Volume: (cm) 0.019 Wound Description Classification: Grade 2 Foul Odor After Cleansing: No Wound Margin: Well defined, not attached Slough/Fibrino No Exudate Amount: Small Exudate Type: Serosanguineous Exudate Color: red, brown Wound Bed Granulation Amount: Large (67-100%) Exposed Structure Granulation Quality: Red, Pink Fascia Exposed: No Necrotic Amount: None Present (0%) Fat Layer (Subcutaneous Tissue) Exposed: Yes Tendon Exposed: No Muscle Exposed: No Joint Exposed: No Bone Exposed: No Electronic Signature(s) Signed: 08/19/2021 5:46:45 PM By: Deon Pilling RN, BSN Signed: 08/20/2021 11:11:37 AM By: Sandre Kitty Entered By: Sandre Kitty on 08/19/2021 14:37:19

## 2021-08-25 ENCOUNTER — Encounter (HOSPITAL_BASED_OUTPATIENT_CLINIC_OR_DEPARTMENT_OTHER): Payer: Medicare Other | Admitting: Internal Medicine

## 2021-08-29 ENCOUNTER — Encounter (HOSPITAL_BASED_OUTPATIENT_CLINIC_OR_DEPARTMENT_OTHER): Payer: Medicare Other | Attending: Internal Medicine | Admitting: Internal Medicine

## 2021-08-29 ENCOUNTER — Other Ambulatory Visit: Payer: Self-pay

## 2021-08-29 DIAGNOSIS — F17200 Nicotine dependence, unspecified, uncomplicated: Secondary | ICD-10-CM | POA: Diagnosis not present

## 2021-08-29 DIAGNOSIS — X58XXXA Exposure to other specified factors, initial encounter: Secondary | ICD-10-CM | POA: Diagnosis not present

## 2021-08-29 DIAGNOSIS — E1142 Type 2 diabetes mellitus with diabetic polyneuropathy: Secondary | ICD-10-CM | POA: Diagnosis not present

## 2021-08-29 DIAGNOSIS — Z89511 Acquired absence of right leg below knee: Secondary | ICD-10-CM | POA: Diagnosis not present

## 2021-08-29 DIAGNOSIS — S81801D Unspecified open wound, right lower leg, subsequent encounter: Secondary | ICD-10-CM

## 2021-08-29 DIAGNOSIS — E11621 Type 2 diabetes mellitus with foot ulcer: Secondary | ICD-10-CM | POA: Diagnosis present

## 2021-08-29 DIAGNOSIS — S81801A Unspecified open wound, right lower leg, initial encounter: Secondary | ICD-10-CM | POA: Diagnosis not present

## 2021-08-29 DIAGNOSIS — L97522 Non-pressure chronic ulcer of other part of left foot with fat layer exposed: Secondary | ICD-10-CM | POA: Diagnosis not present

## 2021-08-29 LAB — GLUCOSE, CAPILLARY: Glucose-Capillary: 420 mg/dL — ABNORMAL HIGH (ref 70–99)

## 2021-09-01 ENCOUNTER — Encounter (HOSPITAL_BASED_OUTPATIENT_CLINIC_OR_DEPARTMENT_OTHER): Payer: Medicare Other | Admitting: Internal Medicine

## 2021-09-01 ENCOUNTER — Inpatient Hospital Stay (HOSPITAL_COMMUNITY)
Admission: EM | Admit: 2021-09-01 | Discharge: 2021-09-03 | DRG: 682 | Disposition: A | Discharge status: Home / Self Care | Payer: Medicare Other | Attending: Student in an Organized Health Care Education/Training Program | Admitting: Student in an Organized Health Care Education/Training Program

## 2021-09-01 ENCOUNTER — Emergency Department (HOSPITAL_COMMUNITY): Payer: Medicare Other

## 2021-09-01 ENCOUNTER — Other Ambulatory Visit: Payer: Self-pay

## 2021-09-01 ENCOUNTER — Encounter (HOSPITAL_COMMUNITY): Payer: Self-pay

## 2021-09-01 DIAGNOSIS — N1832 Chronic kidney disease, stage 3b: Secondary | ICD-10-CM | POA: Diagnosis present

## 2021-09-01 DIAGNOSIS — D509 Iron deficiency anemia, unspecified: Secondary | ICD-10-CM | POA: Diagnosis present

## 2021-09-01 DIAGNOSIS — Z20822 Contact with and (suspected) exposure to covid-19: Secondary | ICD-10-CM | POA: Diagnosis present

## 2021-09-01 DIAGNOSIS — Z79899 Other long term (current) drug therapy: Secondary | ICD-10-CM

## 2021-09-01 DIAGNOSIS — Z91048 Other nonmedicinal substance allergy status: Secondary | ICD-10-CM

## 2021-09-01 DIAGNOSIS — E861 Hypovolemia: Secondary | ICD-10-CM | POA: Diagnosis present

## 2021-09-01 DIAGNOSIS — N179 Acute kidney failure, unspecified: Principal | ICD-10-CM | POA: Diagnosis present

## 2021-09-01 DIAGNOSIS — K920 Hematemesis: Secondary | ICD-10-CM | POA: Diagnosis present

## 2021-09-01 DIAGNOSIS — Z833 Family history of diabetes mellitus: Secondary | ICD-10-CM

## 2021-09-01 DIAGNOSIS — Z818 Family history of other mental and behavioral disorders: Secondary | ICD-10-CM

## 2021-09-01 DIAGNOSIS — I10 Essential (primary) hypertension: Secondary | ICD-10-CM | POA: Diagnosis present

## 2021-09-01 DIAGNOSIS — L97522 Non-pressure chronic ulcer of other part of left foot with fat layer exposed: Secondary | ICD-10-CM | POA: Diagnosis not present

## 2021-09-01 DIAGNOSIS — E785 Hyperlipidemia, unspecified: Secondary | ICD-10-CM | POA: Diagnosis present

## 2021-09-01 DIAGNOSIS — Z794 Long term (current) use of insulin: Secondary | ICD-10-CM

## 2021-09-01 DIAGNOSIS — R6510 Systemic inflammatory response syndrome (SIRS) of non-infectious origin without acute organ dysfunction: Secondary | ICD-10-CM

## 2021-09-01 DIAGNOSIS — K2101 Gastro-esophageal reflux disease with esophagitis, with bleeding: Secondary | ICD-10-CM | POA: Diagnosis present

## 2021-09-01 DIAGNOSIS — E872 Acidosis, unspecified: Secondary | ICD-10-CM | POA: Diagnosis present

## 2021-09-01 DIAGNOSIS — I129 Hypertensive chronic kidney disease with stage 1 through stage 4 chronic kidney disease, or unspecified chronic kidney disease: Secondary | ICD-10-CM | POA: Diagnosis present

## 2021-09-01 DIAGNOSIS — Z8249 Family history of ischemic heart disease and other diseases of the circulatory system: Secondary | ICD-10-CM

## 2021-09-01 DIAGNOSIS — Z89511 Acquired absence of right leg below knee: Secondary | ICD-10-CM

## 2021-09-01 DIAGNOSIS — E1142 Type 2 diabetes mellitus with diabetic polyneuropathy: Secondary | ICD-10-CM | POA: Diagnosis present

## 2021-09-01 DIAGNOSIS — F419 Anxiety disorder, unspecified: Secondary | ICD-10-CM | POA: Diagnosis present

## 2021-09-01 DIAGNOSIS — K219 Gastro-esophageal reflux disease without esophagitis: Secondary | ICD-10-CM | POA: Diagnosis present

## 2021-09-01 DIAGNOSIS — I152 Hypertension secondary to endocrine disorders: Secondary | ICD-10-CM | POA: Diagnosis present

## 2021-09-01 DIAGNOSIS — F17210 Nicotine dependence, cigarettes, uncomplicated: Secondary | ICD-10-CM | POA: Diagnosis present

## 2021-09-01 DIAGNOSIS — E1122 Type 2 diabetes mellitus with diabetic chronic kidney disease: Secondary | ICD-10-CM | POA: Diagnosis present

## 2021-09-01 DIAGNOSIS — K2100 Gastro-esophageal reflux disease with esophagitis, without bleeding: Secondary | ICD-10-CM | POA: Diagnosis present

## 2021-09-01 DIAGNOSIS — E1165 Type 2 diabetes mellitus with hyperglycemia: Secondary | ICD-10-CM

## 2021-09-01 DIAGNOSIS — F32A Depression, unspecified: Secondary | ICD-10-CM | POA: Diagnosis present

## 2021-09-01 LAB — CBC WITH DIFFERENTIAL/PLATELET
Abs Immature Granulocytes: 0.07 10*3/uL (ref 0.00–0.07)
Basophils Absolute: 0 10*3/uL (ref 0.0–0.1)
Basophils Relative: 0 %
Eosinophils Absolute: 0.1 10*3/uL (ref 0.0–0.5)
Eosinophils Relative: 1 %
HCT: 36.4 % — ABNORMAL LOW (ref 39.0–52.0)
Hemoglobin: 11.3 g/dL — ABNORMAL LOW (ref 13.0–17.0)
Immature Granulocytes: 1 %
Lymphocytes Relative: 25 %
Lymphs Abs: 3.1 10*3/uL (ref 0.7–4.0)
MCH: 27.6 pg (ref 26.0–34.0)
MCHC: 31 g/dL (ref 30.0–36.0)
MCV: 89 fL (ref 80.0–100.0)
Monocytes Absolute: 0.9 10*3/uL (ref 0.1–1.0)
Monocytes Relative: 7 %
Neutro Abs: 8.6 10*3/uL — ABNORMAL HIGH (ref 1.7–7.7)
Neutrophils Relative %: 66 %
Platelets: 238 10*3/uL (ref 150–400)
RBC: 4.09 MIL/uL — ABNORMAL LOW (ref 4.22–5.81)
RDW: 14 % (ref 11.5–15.5)
WBC: 12.8 10*3/uL — ABNORMAL HIGH (ref 4.0–10.5)
nRBC: 0 % (ref 0.0–0.2)

## 2021-09-01 LAB — COMPREHENSIVE METABOLIC PANEL
ALT: 11 U/L (ref 0–44)
AST: 13 U/L — ABNORMAL LOW (ref 15–41)
Albumin: 3 g/dL — ABNORMAL LOW (ref 3.5–5.0)
Alkaline Phosphatase: 112 U/L (ref 38–126)
Anion gap: 13 (ref 5–15)
BUN: 39 mg/dL — ABNORMAL HIGH (ref 6–20)
CO2: 30 mmol/L (ref 22–32)
Calcium: 8.1 mg/dL — ABNORMAL LOW (ref 8.9–10.3)
Chloride: 94 mmol/L — ABNORMAL LOW (ref 98–111)
Creatinine, Ser: 3.61 mg/dL — ABNORMAL HIGH (ref 0.61–1.24)
GFR, Estimated: 20 mL/min — ABNORMAL LOW (ref >60)
Glucose, Bld: 61 mg/dL — ABNORMAL LOW (ref 70–99)
Potassium: 3.4 mmol/L — ABNORMAL LOW (ref 3.5–5.1)
Sodium: 137 mmol/L (ref 135–145)
Total Bilirubin: 0.7 mg/dL (ref 0.3–1.2)
Total Protein: 6.5 g/dL (ref 6.5–8.1)

## 2021-09-01 LAB — LIPASE, BLOOD: Lipase: 51 U/L (ref 11–51)

## 2021-09-01 LAB — PROTIME-INR
INR: 0.9 (ref 0.8–1.2)
Prothrombin Time: 12.5 seconds (ref 11.4–15.2)

## 2021-09-01 LAB — TYPE AND SCREEN
ABO/RH(D): A NEG
Antibody Screen: NEGATIVE

## 2021-09-01 LAB — RESP PANEL BY RT-PCR (FLU A&B, COVID) ARPGX2
Influenza A by PCR: NEGATIVE
Influenza B by PCR: NEGATIVE
SARS Coronavirus 2 by RT PCR: NEGATIVE

## 2021-09-01 LAB — CBG MONITORING, ED: Glucose-Capillary: 142 mg/dL — ABNORMAL HIGH (ref 70–99)

## 2021-09-01 MED ORDER — SODIUM CHLORIDE 0.9 % IV BOLUS
1000.0000 mL | Freq: Once | INTRAVENOUS | Status: AC
Start: 2021-09-02 — End: 2021-09-02
  Administered 2021-09-02: 1000 mL via INTRAVENOUS

## 2021-09-01 MED ORDER — ONDANSETRON HCL 4 MG/2ML IJ SOLN
4.0000 mg | Freq: Once | INTRAMUSCULAR | Status: AC
  Administered 2021-09-02: 4 mg via INTRAVENOUS
  Filled 2021-09-01: qty 2

## 2021-09-01 NOTE — Progress Notes (Addendum)
TREMAIN, RUCINSKI ([id number]) Visit Report for 09/01/2021 Arrival Information Details Patient Name: Date of Service: [name] 09/01/2021 4:00 PM Medical Record Number: [id number] Patient Account Number: [id number] Date of Birth/Sex: Treating RN: 1970-12-08 (50 y.o. [name], Vaughan Basta Primary Care Lennart Gladish: [name] Other Clinician: Referring Yaritzel Stange: Treating Kalman Nylen/Extender: Delano Metz in Treatment: 23 Visit Information History Since Last Visit Added or deleted any medications: No Patient Arrived: Ambulatory Any new allergies or adverse reactions: No Arrival Time: 15:56 Had a fall or experienced change in No Accompanied By: self activities of daily living that may affect Transfer Assistance: None risk of falls: Patient Identification Verified: Yes Signs or symptoms of abuse/neglect since last visito No Secondary Verification Process Completed: Yes Hospitalized since last visit: No Patient Requires Transmission-Based Precautions: No Implantable device outside of the clinic excluding No Patient Has Alerts: No cellular tissue based products placed in the center since last visit: Has Dressing in Place as Prescribed: Yes Pain Present Now: No Electronic Signature(s) Signed: 09/01/2021 4:58:11 PM By: Baruch Gouty RN, BSN Entered By: Baruch Gouty on 09/01/2021 15:57:20 -------------------------------------------------------------------------------- Encounter Discharge Information Details Patient Name: Date of Service: [name], [name] 09/01/2021 4:00 PM Medical Record Number: [id number] Patient Account Number: [id number] Date of Birth/Sex: Treating RN: 1971-03-22 (50 y.o. [name] Primary Care Season Astacio: [name] Other Clinician: Referring Shirle Provencal: Treating Bambi Fehnel/Extender: Delano Metz in Treatment: 63 Encounter Discharge Information Items Post Procedure  Vitals Discharge Condition: Stable Temperature (F): 99.2 Ambulatory Status: Ambulatory Pulse (bpm): 112 Discharge Destination: Home Respiratory Rate (breaths/min): 18 Transportation: Private Auto Blood Pressure (mmHg): 108/77 Schedule Follow-up Appointment: Yes Clinical Summary of Care: Provided on 09/01/2021 Form Type Recipient Paper Patient Patient Electronic Signature(s) Signed: 09/01/2021 5:11:49 PM By: Lorrin Jackson Entered By: Lorrin Jackson on 09/01/2021 16:51:59 -------------------------------------------------------------------------------- Lower Extremity Assessment Details Patient Name: Date of Service: [name], [name] 09/01/2021 4:00 PM Medical Record Number: [id number] Patient Account Number: [id number] Date of Birth/Sex: Treating RN: September 08, 1971 (50 y.o. [name] Primary Care Melodye Swor: [name] Other Clinician: Referring Duston Smolenski: Treating Dmiyah Liscano/Extender: Delano Metz in Treatment: 54 Edema Assessment Assessed: [name]: No] [Right: No] Edema: [Left: N] [Right: o] Calf Left: Right: Point of Measurement: 50 cm From Medial Instep 39.8 cm Ankle Left: Right: Point of Measurement: 11 cm From Medial Instep 21.2 cm Vascular Assessment Pulses: Dorsalis Pedis Palpable: [Left:Yes] Electronic Signature(s) Signed: 09/01/2021 4:58:11 PM By: Baruch Gouty RN, BSN Entered By: Baruch Gouty on 09/01/2021 16:01:30 -------------------------------------------------------------------------------- Multi Wound Chart Details Patient Name: Date of Service: [name], [name] 09/01/2021 4:00 PM Medical Record Number: [id number] Patient Account Number: [id number] Date of Birth/Sex: Treating RN: 1971/02/09 (50 y.o. [name] Primary Care Marchetta Navratil: [name] Other Clinician: Referring Zenya Hickam: Treating Aseem Sessums/Extender: Delano Metz in Treatment: 75 Vital Signs Height(in):  77 Pulse(bpm): 112 Weight(lbs): 232 Blood Pressure(mmHg): 108/77 Body Mass Index(BMI): 28 Temperature(F): 99.2 Respiratory Rate(breaths/min): 18 Photos: [2R:Left Metatarsal head first] [N/A:N/A N/A] Wound Location: [2R:Blister] [N/A:N/A] Wounding Event: [2R:Diabetic Wound/Ulcer of the Lower] [N/A:N/A] Primary Etiology: [2R:Extremity Anemia, Hypertension, Peripheral] [N/A:N/A] Comorbid History: [2R:Venous Disease, Type II Diabetes 06/23/2020] [N/A:N/A] Date Acquired: [2R:54] [N/A:N/A] Weeks of Treatment: [2R:Open] [N/A:N/A] Wound Status: [2R:Yes] [N/A:N/A] Wound Recurrence: [2R:0.5x0.4x0.4] [N/A:N/A] Measurements L x W x D (cm) [2R:0.157] [N/A:N/A] A (cm) : rea [2R:0.063] [N/A:N/A] Volume (cm) : [2R:-406.50%] [N/A:N/A] % Reduction in A [2R:rea: -2000.00%] [N/A:N/A] % Reduction in Volume: [2R:12] Starting Position 1 (o'clock): [2R:12] Ending  Position 1 (o'clock): [2R:0.7] Maximum Distance 1 (cm): [2R:Yes] [N/A:N/A] Undermining: [2R:Grade 2] [N/A:N/A] Classification: [2R:Small] [N/A:N/A] Exudate A mount: [2R:Serosanguineous] [N/A:N/A] Exudate Type: [2R:red, brown] [N/A:N/A] Exudate Color: [2R:Well defined, not attached] [N/A:N/A] Wound Margin: [2R:Large (67-100%)] [N/A:N/A] Granulation A mount: [2R:Red] [N/A:N/A] Granulation Quality: [2R:None Present (0%)] [N/A:N/A] Necrotic A mount: [2R:Fat Layer (Subcutaneous Tissue): Yes N/A] Exposed Structures: [2R:Fascia: No Tendon: No Muscle: No Joint: No Bone: No None] [N/A:N/A] Epithelialization: [2R:Debridement - Excisional] [N/A:N/A] Debridement: Pre-procedure Verification/Time Out 16:17 [N/A:N/A] Taken: [2R:Callus, Subcutaneous] [N/A:N/A] Tissue Debrided: [2R:Skin/Subcutaneous Tissue] [N/A:N/A] Level: [2R:0.2] [N/A:N/A] Debridement A (sq cm): [2R:rea Curette] [N/A:N/A] Instrument: [2R:Minimum] [N/A:N/A] Bleeding: [2R:Pressure] [N/A:N/A] Hemostasis A chieved: [2R:Procedure was tolerated well] [N/A:N/A] Debridement Treatment  Response: [2R:0.5x0.4x0.4] [N/A:N/A] Post Debridement Measurements L x W x D (cm) [2R:0.063] [N/A:N/A] Post Debridement Volume: (cm) [2R:Cellular or Tissue Based Product] [N/A:N/A] Procedures Performed: [2R:Debridement] Treatment Notes Wound #2R (Metatarsal head first) Wound Laterality: Left Cleanser Soap and Water Discharge Instruction: May shower and wash wound with dial antibacterial soap and water prior to dressing change. Wound Cleanser Discharge Instruction: Cleanse the wound with wound cleanser prior to applying a clean dressing using gauze sponges, not tissue or cotton balls. Peri-Wound Care Sween Lotion (Moisturizing lotion) Discharge Instruction: Apply moisturizing lotion to leg Topical Primary Dressing Secondary Dressing Woven Gauze Sponges 2x2 in Discharge Instruction: Apply over primary dressing as directed. Optifoam Non-Adhesive Dressing, 4x4 in Discharge Instruction: cut to make foam donut to help offload ADAPTIC TOUCH 3x4.25 in Discharge Instruction: Apply over primary dressing as directed. Secured With Conforming Stretch Gauze Bandage, Sterile 2x75 (in/in) Discharge Instruction: Secure with stretch gauze as directed. 50M Medipore H Soft Cloth Surgical T ape, 2x2 (in/yd) Discharge Instruction: Secure dressing with tape as directed. Compression Wrap Compression Stockings Add-Ons Electronic Signature(s) Signed: 09/02/2021 12:51:36 PM By: Kalman Shan DO Signed: 09/02/2021 5:14:50 PM By: Fara Chute By: Kalman Shan on 09/02/2021 12:46:41 -------------------------------------------------------------------------------- Multi-Disciplinary Care Plan Details Patient Name: Date of Service: [name], [name] 09/01/2021 4:00 PM Medical Record Number: [id number] Patient Account Number: [id number] Date of Birth/Sex: Treating RN: 1971-04-27 (50 y.o. [name] Primary Care Kipton Skillen: [name] Other Clinician: Referring  Elliett Guarisco: Treating Charlea Nardo/Extender: Delano Metz in Treatment: 19 Multidisciplinary Care Plan reviewed with physician Active Inactive Wound/Skin Impairment Nursing Diagnoses: Impaired tissue integrity Goals: Patient/caregiver will verbalize understanding of skin care regimen Date Initiated: 11/19/2020 Target Resolution Date: 09/16/2021 Goal Status: Active Ulcer/skin breakdown will have a volume reduction of 30% by week 4 Date Initiated: 08/16/2020 Date Inactivated: 03/03/2021 Target Resolution Date: 03/10/2021 Goal Status: Met Interventions: Provide education on ulcer and skin care Notes: 07/24/21: Wound care regimen ongoing. Electronic Signature(s) Signed: 09/01/2021 4:58:11 PM By: Baruch Gouty RN, BSN Entered By: Baruch Gouty on 09/01/2021 16:06:32 -------------------------------------------------------------------------------- Pain Assessment Details Patient Name: Date of Service: [name], [name] 09/01/2021 4:00 PM Medical Record Number: [id number] Patient Account Number: [id number] Date of Birth/Sex: Treating RN: 1971-06-01 (50 y.o. [name] Primary Care Kameron Blethen: [name] Other Clinician: Referring Zebulan Hinshaw: Treating Elleanor Guyett/Extender: Delano Metz in Treatment: 25 Active Problems Location of Pain Severity and Description of Pain Patient Has Paino No Site Locations Rate the pain. Current Pain Level: 0 Pain Management and Medication Current Pain Management: Electronic Signature(s) Signed: 09/01/2021 4:58:11 PM By: Baruch Gouty RN, BSN Entered By: Baruch Gouty on 09/01/2021 15:59:00 -------------------------------------------------------------------------------- Patient/Caregiver Education Details Patient Name: Date of Service: [name], Payton Doughty 10/10/2022andnbsp4:00 PM Medical Record Number: [id number] Patient Account Number: [id number] Date  of Birth/Gender: Treating  RN: 03/24/1971 (50 y.o. [name] Primary Care Physician: [name] Other Clinician: Referring Physician: Treating Physician/Extender: Delano Metz in Treatment: 51 Education Assessment Education Provided To: Patient Education Topics Provided Offloading: Methods: Explain/Verbal Responses: Reinforcements needed, State content correctly Wound/Skin Impairment: Methods: Explain/Verbal Responses: Reinforcements needed, State content correctly Electronic Signature(s) Signed: 09/01/2021 4:58:11 PM By: Baruch Gouty RN, BSN Entered By: Baruch Gouty on 09/01/2021 16:06:52 -------------------------------------------------------------------------------- Wound Assessment Details Patient Name: Date of Service: [name], [name] 09/01/2021 4:00 PM Medical Record Number: [id number] Patient Account Number: [id number] Date of Birth/Sex: Treating RN: [birth year] (50 y.o. [name] Primary Care Haylin Camilli: [name] Other Clinician: Referring Dyshon Philbin: Treating Lindyn Vossler/Extender: Delano Metz in Treatment: 63 Wound Status Wound Number: 2R Primary Diabetic Wound/Ulcer of the Lower Extremity Etiology: Wound Location: Left Metatarsal head first Wound Status: Open Wounding Event: Blister Comorbid Anemia, Hypertension, Peripheral Venous Disease, Type II Date Acquired: 06/23/2020 History: Diabetes Weeks Of Treatment: 54 Clustered Wound: No Photos Wound Measurements Length: (cm) 0.5 Width: (cm) 0.4 Depth: (cm) 0.4 Area: (cm) 0.157 Volume: (cm) 0.063 % Reduction in Area: -406.5% % Reduction in Volume: -2000% Epithelialization: None Tunneling: No Undermining: Yes Starting Position (o'clock): 12 Ending Position (o'clock): 12 Maximum Distance: (cm) 0.7 Wound Description Classification: Grade 2 Wound Margin: Well defined, not attached Exudate Amount: Small Exudate Type: Serosanguineous Exudate  Color: red, brown Foul Odor After Cleansing: No Slough/Fibrino No Wound Bed Granulation Amount: Large (67-100%) Exposed Structure Granulation Quality: Red Fascia Exposed: No Necrotic Amount: None Present (0%) Fat Layer (Subcutaneous Tissue) Exposed: Yes Tendon Exposed: No Muscle Exposed: No Joint Exposed: No Bone Exposed: No Treatment Notes Wound #2R (Metatarsal head first) Wound Laterality: Left Cleanser Soap and Water Discharge Instruction: May shower and wash wound with dial antibacterial soap and water prior to dressing change. Wound Cleanser Discharge Instruction: Cleanse the wound with wound cleanser prior to applying a clean dressing using gauze sponges, not tissue or cotton balls. Peri-Wound Care Sween Lotion (Moisturizing lotion) Discharge Instruction: Apply moisturizing lotion to leg Topical Primary Dressing Secondary Dressing Woven Gauze Sponges 2x2 in Discharge Instruction: Apply over primary dressing as directed. Optifoam Non-Adhesive Dressing, 4x4 in Discharge Instruction: cut to make foam donut to help offload ADAPTIC TOUCH 3x4.25 in Discharge Instruction: Apply over primary dressing as directed. Secured With Conforming Stretch Gauze Bandage, Sterile 2x75 (in/in) Discharge Instruction: Secure with stretch gauze as directed. 92M Medipore H Soft Cloth Surgical T ape, 2x2 (in/yd) Discharge Instruction: Secure dressing with tape as directed. Compression Wrap Compression Stockings Add-Ons Electronic Signature(s) Signed: 09/01/2021 4:58:11 PM By: Baruch Gouty RN, BSN Signed: 09/01/2021 5:11:49 PM By: Lorrin Jackson Entered By: Baruch Gouty on 09/01/2021 16:04:46 -------------------------------------------------------------------------------- Vitals Details Patient Name: Date of Service: [name], [name] 09/01/2021 4:00 PM Medical Record Number: [id number] Patient Account Number: [id number] Date of Birth/Sex: Treating RN: Nov 01, 1971 (50 y.o. [name] Primary Care Kenyata Guess: [name] Other Clinician: Referring Avabella Wailes: Treating Jayren Cease/Extender: Delano Metz in Treatment: 33 Vital Signs Time Taken: 15:57 Temperature (F): 99.2 Height (in): 77 Pulse (bpm): 112 Source: Stated Respiratory Rate (breaths/min): 18 Weight (lbs): 232 Blood Pressure (mmHg): 108/77 Source: Stated Reference Range: 80 - 120 mg / dl Body Mass Index (BMI): 27.5 Notes has not checked his blood sugar since Friday. Pt states vomited again Sunday and had fever of 101 yesterday Electronic Signature(s) Signed: 09/01/2021 4:58:11 PM By: Baruch Gouty RN, BSN Entered By: Baruch Gouty on 09/01/2021  15:58:51

## 2021-09-01 NOTE — ED Provider Notes (Addendum)
[facility] Provider Note   CSN: [id number] Arrival date & time: 09/01/21  1706     History Chief Complaint  Patient presents with   Hyperglycemia    [name] [name] is a 50 y.o. male with a history of diabetes mellitus, HTN, HLD, necrotizing fasciitis, neuropathy, right BKA who presents to the emergency department with a chief complaint of hyperglycemia.  The patient reports that he has had elevated blood sugar, vomiting, fever for the last 4 days.  Reports his blood sugar was elevated to 420 four days ago.  T-max 101.3.  He reports that he had countless episodes of vomiting 3 days ago, but has only had 2 episodes within the last 24 hours.  However, he has had decreased urine output today.  He has voided once, when he woke up at 7 AM.  He reports an associated sore throat and dysphagia, but notes that this began after the vomiting started.  He has been coughing and had rhinorrhea.  He is also endorsing some aching, constant, mild left lower quadrant abdominal pain.  No known aggravating or alleviating factors.  It does not radiate.  He denies diarrhea, constipation, dysuria, hematuria, penile or testicular pain or swelling, chest pain, shortness of breath, neck pain or stiffness, confusion.  He has been taking antipyretics at home, but is uncertain as to what time his last dose was.  He reports a remote history of a previous UTI.  He is unsure if symptoms feel similar.  No known sick contacts.  The history is provided by the patient and medical records. No language interpreter was used.      Past Medical History:  Diagnosis Date   Allergy    seasonal and environmental   Anemia    ARF (acute renal failure) ([place]) 09/2017   Chronic constipation 05/13/2020   Depression    Diabetes mellitus without complication ([place]) 4580   Hyperlipidemia    Hypertension    Necrotizing fasciitis ([place]) 10/13/2017   Neuromuscular disorder ([place])    neuropathy    Wound dehiscence 11/24/2017    Patient Active Problem List   Diagnosis Date Noted   Acute renal failure superimposed on stage 3b chronic kidney disease ([place]) 09/02/2021   Cellulitis 04/10/2021   Severe sepsis ([place]) 04/10/2021   AKI (acute kidney injury) ([place]) 04/10/2021   Cellulitis of both lower extremities 04/10/2021   Achilles tendon contracture, left 03/03/2018   Non-pressure chronic ulcer of other part of left foot limited to breakdown of skin ([place]) 02/02/2018   Ulnar nerve damage, initial encounter 01/05/2018   History of right below knee amputation ([place]) 12/22/2017   Fever    Acute posthemorrhagic anemia    Leukocytosis    Falls 11/24/2017   Benign essential HTN 11/24/2017   Normocytic normochromic anemia 11/24/2017   Sepsis ([place]) 10/11/2017   Uncontrolled type 2 diabetes mellitus with hyperglycemia ([place]) 10/11/2017   ARF (acute renal failure) ([place]) 10/11/2017   Nausea & vomiting 10/11/2017    Past Surgical History:  Procedure Laterality Date   AMPUTATION Right 10/13/2017   Procedure: RIGHT BELOW KNEE AMPUTATION;  Surgeon: [name], MD;  Location: [facility];  Service: Orthopedics;  Laterality: Right;   AMPUTATION Right 11/24/2017   Procedure: AMPUTATION BELOW KNEE REVISION;  Surgeon: [name], MD;  Location: [facility];  Service: Orthopedics;  Laterality: Right;   COLONOSCOPY  05/11/2019   NO PAST SURGERIES     POLYPECTOMY     WISDOM TOOTH  EXTRACTION         Family History  Problem Relation Age of Onset   Diabetes Mellitus II Mother    Depression Mother    Colon polyps Mother    Heart disease Father        CABG x 4.  04/2017   Colon cancer Neg Hx    Esophageal cancer Neg Hx    Liver cancer Neg Hx    Pancreatic cancer Neg Hx    Rectal cancer Neg Hx    Stomach cancer Neg Hx     Social History   Tobacco Use   Smoking status: Every Day    Packs/day: 0.25    Years: 35.00    Pack years: 8.75    Types: Cigarettes   Smokeless tobacco: Never   Tobacco  comments:    stopped, but back on 1 cigarette daily.  Vaping Use   Vaping Use: Never used  Substance Use Topics   Alcohol use: No   Drug use: No    Home Medications Prior to Admission medications   Medication Sig Start Date End Date Taking? Authorizing Provider  Truddie Crumble ULTRA-THIN LANCETS MISC Check blood sugars before meals twice daily 11/19/17   Mack Hook, MD  amLODipine (NORVASC) 10 MG tablet Take 10 mg by mouth every morning.     [provider]  ARIPiprazole (ABILIFY) 20 MG tablet Take 20 mg by mouth every morning. 08/06/19   [provider]  atorvastatin (LIPITOR) 20 MG tablet Take 20 mg by mouth every morning. 06/07/19   [provider]  baclofen (LIORESAL) 10 MG tablet 1/2 tab twice a day Patient taking differently: Take 20 mg by mouth 2 (two) times daily. 01/28/18   Mack Hook, MD  Blood Glucose Monitoring Suppl [facility] PRESTO) w/Device KIT Check sugars twice daily before meals 11/19/17   Mack Hook, MD  dapagliflozin propanediol (FARXIGA) 10 MG TABS tablet Take 10 mg by mouth daily. 04/08/20   [provider]  desvenlafaxine (PRISTIQ) 100 MG 24 hr tablet Take 100 mg by mouth daily. 02/20/20   [provider]  Dulaglutide (TRULICITY) 9.51 OA/4.1YS SOPN Inject 0.75 mg into the skin once a week. Patient taking differently: Inject 0.75 mg into the skin once a week. Friday 03/21/21   Renato Shin, MD  DULoxetine (CYMBALTA) 60 MG capsule Take 60 mg by mouth 2 (two) times daily.  01/04/19   [provider]  FLUoxetine (PROZAC) 40 MG capsule Take 40 mg by mouth every morning. 03/26/21   [provider]  fluticasone (FLONASE) 50 MCG/ACT nasal spray Place 2 sprays into both nostrils daily as needed for allergies or rhinitis.  01/09/19   [provider]  gabapentin (NEURONTIN) 300 MG capsule 1 cap by mouth in morning and midday, 2 caps by mouth at bedtime Patient taking differently: Take 300-600  mg by mouth See admin instructions. Take one capsule (300 mg) by mouth in the morning and afternoon, take two capsules (600 mg) at bedtime 11/19/17   Mack Hook, MD  glipiZIDE (GLUCOTROL) 5 MG tablet 1/2 tab by mouth twice daily with meal Patient taking differently: Take 5 mg by mouth daily. With dinner meal 11/19/17   Mack Hook, MD  glucose blood (AGAMATRIX PRESTO TEST) test strip Check blood sugars twice daily before meals 02/02/18   Mack Hook, MD  Insulin NPH, Human,, Isophane, (HUMULIN N KWIKPEN) 100 UNIT/ML Kiwkpen Inject 5 Units into the skin every morning. And pen needles 1/day 05/27/21   Renato Shin, MD  Insulin Pen Needle (PEN NEEDLES) 30G X 5 MM MISC check blood sugar 4 times a day 06/02/21   Renato Shin, MD  lamoTRIgine (LAMICTAL) 100 MG tablet Take 100 mg by mouth 2 (two) times daily. 04/07/21   [provider]  lisinopril (ZESTRIL) 20 MG tablet Take 1 tablet (20 mg total) by mouth daily. 04/22/21   Charlynne Cousins, MD  metoCLOPramide (REGLAN) 5 MG tablet Take 1 tablet (5 mg total) by mouth every 8 (eight) hours as needed for nausea or vomiting. 06/04/21   Doran Stabler, MD  metoprolol tartrate (LOPRESSOR) 25 MG tablet Take 25 mg by mouth 2 (two) times daily. 02/21/21   [provider]  ondansetron (ZOFRAN) 8 MG tablet Take 8 mg by mouth every 8 (eight) hours as needed for nausea or vomiting. 12/01/19   [provider]  pantoprazole (PROTONIX) 40 MG tablet Take 1 tablet (40 mg total) by mouth 2 (two) times daily. 04/23/21   Daleen Bo, MD    Allergies    Adhesive [tape]  Review of Systems   Review of Systems  Constitutional:  Positive for chills and fever. Negative for appetite change.  HENT:  Positive for rhinorrhea and sore throat. Negative for congestion, ear pain, facial swelling, hearing loss, tinnitus and voice change.   Eyes:  Negative for visual disturbance.  Respiratory:  Positive for cough. Negative for shortness  of breath.   Cardiovascular:  Negative for chest pain and palpitations.  Gastrointestinal:  Positive for abdominal pain, nausea and vomiting. Negative for constipation and diarrhea.  Genitourinary:  Negative for dysuria.  Musculoskeletal:  Negative for back pain, gait problem, myalgias and neck pain.  Skin:  Negative for rash.  Allergic/Immunologic: Positive for immunocompromised state.  Neurological:  Negative for dizziness, seizures, syncope, weakness, light-headedness, numbness and headaches.  Psychiatric/Behavioral:  Negative for confusion.    Physical Exam Updated Vital Signs BP (!) 157/89   Pulse 96   Temp (!) 97.5 F (36.4 C) (Oral)   Resp 16   SpO2 92%   Physical Exam Vitals and nursing note reviewed.  Constitutional:      General: He is not in acute distress.    Appearance: He is well-developed. He is not ill-appearing, toxic-appearing or diaphoretic.  HENT:     Head: Normocephalic.     Nose: No rhinorrhea.     Mouth/Throat:     Comments: Uvula is midline.  Posterior oropharynx is unremarkable.  No erythema, exudates, or edema. Eyes:     Conjunctiva/sclera: Conjunctivae normal.  Cardiovascular:     Rate and Rhythm: Normal rate and regular rhythm.     Heart sounds: No murmur heard. Pulmonary:     Effort: Pulmonary effort is normal. No respiratory distress.     Breath sounds: No stridor. No wheezing, rhonchi or rales.     Comments: Lungs are clear to auscultation bilaterally. Chest:     Chest wall: No tenderness.  Abdominal:     General: There is no distension.     Palpations: Abdomen is soft.     Tenderness: There is abdominal tenderness.     Comments: Mild tenderness palpation in the left lower quadrant and epigastric region.  No rebound or guarding.  Normoactive bowel sounds.  Negative Murphy sign.  No tenderness over McBurney's point.  No CVA tenderness bilaterally.  Musculoskeletal:     Cervical back: Neck supple.  Skin:    General: Skin is warm and dry.      Capillary Refill: Capillary  refill takes less than 2 seconds.     Comments: Skin is dry and cracked  Neurological:     General: No focal deficit present.     Mental Status: He is alert.  Psychiatric:        Behavior: Behavior normal.    ED Results / Procedures / Treatments   Labs (all labs ordered are listed, but only abnormal results are displayed) Labs Reviewed  CBC WITH DIFFERENTIAL/PLATELET - Abnormal; Notable for the following components:      Result Value   WBC 12.8 (*)    RBC 4.09 (*)    Hemoglobin 11.3 (*)    HCT 36.4 (*)    Neutro Abs 8.6 (*)    All other components within normal limits  COMPREHENSIVE METABOLIC PANEL - Abnormal; Notable for the following components:   Potassium 3.4 (*)    Chloride 94 (*)    Glucose, Bld 61 (*)    BUN 39 (*)    Creatinine, Ser 3.61 (*)    Calcium 8.1 (*)    Albumin 3.0 (*)    AST 13 (*)    GFR, Estimated 20 (*)    All other components within normal limits  LACTIC ACID, PLASMA - Abnormal; Notable for the following components:   Lactic Acid, Venous 3.5 (*)    All other components within normal limits  LACTIC ACID, PLASMA - Abnormal; Notable for the following components:   Lactic Acid, Venous 3.0 (*)    All other components within normal limits  CBG MONITORING, ED - Abnormal; Notable for the following components:   Glucose-Capillary 142 (*)    All other components within normal limits  RESP PANEL BY RT-PCR (FLU A&B, COVID) [id number]  GROUP A STREP BY PCR  CULTURE, BLOOD (ROUTINE X 2)  CULTURE, BLOOD (ROUTINE X 2)  LIPASE, BLOOD  PROTIME-INR  URINALYSIS, ROUTINE W REFLEX MICROSCOPIC  CBG MONITORING, ED  TYPE AND SCREEN    EKG None  Radiology CT ABDOMEN PELVIS WO CONTRAST  Result Date: 09/02/2021 CLINICAL DATA:  Abdominal pain, fever EXAM: CT ABDOMEN AND PELVIS WITHOUT CONTRAST TECHNIQUE: Multidetector CT imaging of the abdomen and pelvis was performed following the standard protocol without IV contrast. COMPARISON:  None.  FINDINGS: Lower chest: The visualized lung bases are clear. The visualized heart and pericardium are unremarkable. There is circumferential wall thickening involving the distal esophagus and paraesophageal inflammatory stranding in keeping with changes of esophagitis, possibly reflux esophagitis. Hepatobiliary: Cholelithiasis without pericholecystic inflammatory change noted. Liver unremarkable. No intra or extrahepatic biliary ductal dilation. Pancreas: Unremarkable Spleen: Unremarkable Adrenals/Urinary Tract: Nodular thickening of the left adrenal gland is unchanged. The right adrenal gland is unremarkable. The kidneys are normal. The bladder is unremarkable. Stomach/Bowel: Stomach is within normal limits. Appendix appears normal. No evidence of bowel wall thickening, distention, or inflammatory changes. No free intraperitoneal gas or fluid. Vascular/Lymphatic: Minimal atherosclerotic calcification within the abdominal aorta. No aortic aneurysm. No pathologic adenopathy within the abdomen and pelvis. Reproductive: Prostate is unremarkable. Other: Tiny bilateral fat containing inguinal hernias. Rectum unremarkable. Musculoskeletal: No acute bone abnormality. No lytic or blastic bone lesion. IMPRESSION: No acute intra-abdominal pathology identified. No definite radiographic explanation for the patient's reported symptoms. Circumferential wall thickening and paraesophageal inflammatory stranding in keeping with esophagitis, possibly reflux esophagitis. This would be better assessed with endoscopy, if indicated. Cholelithiasis. Aortic Atherosclerosis (ICD10-I70.0). Electronically Signed   By: Fidela Salisbury M.D.   On: 09/02/2021 02:54   DG Chest 2 View  Result Date: 09/01/2021 CLINICAL  DATA:  Fever, hyperglycemia, fatigue. EXAM: CHEST - 2 VIEW COMPARISON:  None. FINDINGS: Elevation of the left hemidiaphragm. Normal heart size and pulmonary vascularity. No focal airspace disease or consolidation in the lungs. No  blunting of costophrenic angles. No pneumothorax. Mediastinal contours appear intact. Tortuous aorta. Degenerative changes in the spine. IMPRESSION: No active cardiopulmonary disease. Electronically Signed   By: Lucienne Capers M.D.   On: 09/01/2021 20:01    Procedures Procedures   Medications Ordered in ED Medications  vancomycin (VANCOREADY) IVPB 2000 mg/400 mL (2,000 mg Intravenous New Bag/Given 09/02/21 0334)  sodium chloride 0.9 % bolus 1,000 mL (0 mLs Intravenous Stopped 09/02/21 0343)  ondansetron (ZOFRAN) injection 4 mg (4 mg Intravenous Given 09/02/21 0038)  ceFEPIme (MAXIPIME) 2 g in sodium chloride 0.9 % 100 mL IVPB (0 g Intravenous Stopped 09/02/21 0343)  metoCLOPramide (REGLAN) injection 10 mg (10 mg Intravenous Given 09/02/21 0252)  lactated ringers bolus 1,000 mL (1,000 mLs Intravenous New Bag/Given 09/02/21 0334)    ED Course  I have reviewed the triage vital signs and the nursing notes.  Pertinent labs & imaging results that were available during my care of the patient were reviewed by me and considered in my medical decision making (see chart for details).    MDM Rules/Calculators/A&P                           50 year old male with a history of diabetes mellitus, HTN, HLD, necrotizing fasciitis, neuropathy, right BKA who presents emergency department with a 4-day history of fever, vomiting, hyperglycemia, abdominal pain, cough, sore throat, rhinorrhea.  Afebrile in the ED.  Vital signs are otherwise unremarkable.  On exam, skin is dry and cracked.  He has minimal tenderness in the left lower quadrant and epigastric region, but no peritoneal signs.  Posterior oropharynx is unremarkable.  Lungs are clear to auscultation bilaterally.  Labs and imaging of been reviewed and independently interpreted by me.  Glucose was originally 60 on BMP, but on recheck CBG is 142.  He has a mild leukocytosis at 12.8.  Hemoglobin is stable.  He has a creatinine of 3.61, up from 2.25 in  June.  COVID and flu are negative.  Initial lactate is 3.5. He was given 1 L fluid bolus with minimal improvement to 3.0. Second liter fluid bolus given.  Patient still has not voided since arrival into the emergency department.  Chest x-ray is unremarkable.  Strep PCR is negative.  CT abdomen pelvis without contrast with some circumferential wall thickening and paraesophageal stranding consistent with esophagitis.  However, I do not think it is an infection source for his symptoms.  Discussed the patient with Dr. Dayna Barker, attending physician.  Given the patient met SIRS criteria, he was started on cefepime and vancomycin.  Blood cultures have been sent. UA is pending.  With SIRS criteria and new AKI, consult to the hospitalist team was placed for admission.  Dr. Cyd Silence will accept the patient for admission.  Final Clinical Impression(s) / ED Diagnoses Final diagnoses:  SIRS (systemic inflammatory response syndrome) ([place])  AKI (acute kidney injury) [facility])    Rx / DC Orders ED Discharge Orders     None        Joanne Gavel, PA-C 09/02/21 [place], [place] Fosdick A, PA-C 09/02/21 0434    Mesner, Corene Cornea, MD 09/02/21 [phone]

## 2021-09-01 NOTE — Progress Notes (Signed)
MARSALIS, BEAULIEU ([id number]) Visit Report for 08/29/2021 Chief Complaint Document Details Patient Name: Date of Service: [name] 08/29/2021 9:30 A M Medical Record Number: [id number] Patient Account Number: [id number] Date of Birth/Sex: Treating RN: 04/21/1971 (50 y.o. [name] Primary Care Provider: [name] Other Clinician: Referring Provider: Treating Provider/Extender: Delano Metz in Treatment: 25 Information Obtained from: Patient Chief Complaint patient is here for a review of the wound on his plantar left first metatarsal head and back of his right leg Electronic Signature(s) Signed: 08/29/2021 10:52:33 AM By: Kalman Shan DO Entered By: Kalman Shan on 08/29/2021 10:47:13 -------------------------------------------------------------------------------- HPI Details Patient Name: Date of Service: [name], [name] 08/29/2021 9:30 A M Medical Record Number: [id number] Patient Account Number: [id number] Date of Birth/Sex: Treating RN: 05/06/1971 (50 y.o. [name] Primary Care Provider: [name] Other Clinician: Referring Provider: Treating Provider/Extender: Delano Metz in Treatment: 55 History of Present Illness HPI Description: 04/21/18 ADMISSION This is a 50 year old man who is a type II diabetic. In spite of fact his hemoglobin A1c is actually quite good 5.73 months ago he is at a really difficult time over the last 6-7 months. He developed a rapidly progressive infection in the right foot in November 2018 associated with osteomyelitis and necrotizing fasciitis. 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 The stump required and a revision on 11/24/17. The stump revision was advertised as being secondary to falls. I'm not sure the progressive history here however this area is actually closed over. The patient tells me over the same timeframe he has had wounds on  the plantar aspect of his left foot. In the ED saw Dr. Sharol Given in April of this year. 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 It is really not clear that this patient is been dressing this with anything. He came in with the clinic without any specific dressing on the wound areas using his own tennis shoe. He tells me that he only ambulates of course to do a pivot transfer. He does not have his prosthesis for the right leg as of yet and he blames Medicaid for this. He does however use the foot to push himself along in his wheelchair at home. The patient has not had formal arterial studies. ABI in our clinic on the left was 1.13. Patient's past medical history includes type 2 diabetes, fracture of the right fibula. I see that he was treated for abscesses on his right buttock and chest in 2016. I did not look at the microbiology of this. 04/28/18; patient comes back in the clinic today with the wound pretty much the same as when he came in here last week. Small opening lots of undermining relatively. He tells [place] that nothing is really been on this for 3 days in spite of the fact that we gave him enough to dress this easily especially such a small wound. He says he lives with his mother, she is not capable of assisting with this he is changing the dressings himself. 05/05/18; much better-looking wound today. Smaller. There is some undermining medially however that's only perhaps 2 mm. No surrounding erythema. We've been using silver collagen 05/12/18; small wound on the first metatarsal head. No undermining no surrounding erythema. We've been using silver collagen he has home health coming out to change 05/20/18 the wound on the first metatarsal head looks better. Covered in surface debris/callus nonviable  tissue. Required debridement but post debridement this looks quite good. We've been using silver collagen. He has home health 05/27/18; first metatarsal head  wound continues to improve. Just about completely closed. Still a lot of surface debridement callus. We've been using silver collagen 06/06/18; the first metatarsal wound is completely healed over. There is still a lot of callus. I gently removed some of this just to make sure that there was no open area and there is not. The patient mentions to me that his left leg has felt like "lead" for about a week READMISSION 08/16/2020 This is a 50 year old man with type 2 diabetes. He returns to clinic today with a 1 month history of a blister and callus over the first metatarsal head. This is in exactly the same area as when he was here in 2019. 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 He has standard running shoes on the left foot to match the area in his prosthesis. He has only been covering this area with a Band-Aid has not really been specifically dressing this. ABI in our clinic on the left was 1.16. This is essentially stable from the value in 2019 10/1 the patient has a linear wound over the left first metatarsal head. Again not a lot of callus thick skin around this that I removed with a #3 curette. This is not go deep to bone or muscle it does not look to be infected. 10/8 first metatarsal head. The wound looks like it is closing however this is going to be a difficult area to offload in the future. He has a size 15 shoe and he says his shoes are years old. The inserts in these current shoes are totally useless and I have advised him to replace these. He may need a new pair of shoes and I have he got original prosthesis at [employer] [place] I have asked him to go back there. 11/9; the patient has not been here in more than a month. Not sure he was healed when he was here the last time. I told him he would have to get new shoes and certainly offload the area over the left first metatarsal head. He has done nothing of the kind. He arrives back in  clinic with the same old new balance sneakers. A very large separating callus over the first metatarsal head on the left 11/16; he has purchased new shoes and has at least 2 insoles like I asked. The wound is measuring much smaller. He is using silver alginate he changes the dressing himself 11/30; wound is measurably smaller in length of about a half a centimeter. This is generally very little depth. We have been using silver alginate. He changes the dressing himself every second day. 12/14; wound is about the same size. Significant undermining medially relative to the size of the wound. We have been using silver alginate. There is not a way to offload this area as he walks with a prosthesis on the right leg 12/28; wound is about the same size. Again he has undermining medially. I am using polymen on this 12/02/2020; the wound looks smaller. Still a senescent edge. We have been using polymen 1/24; the wound is come down a few millimeters. Still a senescent edge. I have been using polymen 2/8; the wound is come down slightly. Still with slight undermining from 12-6 o'clock I have been using polymen partially to get offloading on this  area which is opposed by his prosthesis on the other leg. 2/22 quite open improvement he still has a comma shaped wound on the left first metatarsal head. Some depth. Using polymen 3/14; he still has the same problem of a comma shaped area over the left first metatarsal head. This seems to have had more depth today at 0.6 cm. We have been using polymen. The patient changes this himself every second day. I explored the idea of a total contact cast on the left leg however this is his driving foot. He was not enamored with the idea of not being able to drive and states that he does not have anybody else to get him around 3/28; again he comes in with a smaller looking wound however there is depth and undermining requiring debridement. We have been using Hydrofera  Blue, changed to silver collagen today. The patient is doing the dressing himself 4/11; patient presents today for follow-up of his left diabetic foot wound. He denies any issues in the past 2 weeks. He is currently using silver collagen every other day with dressing changes. He does not use diabetic shoes or special inserts to his sneakers. He does not use felt pads for offloading. 4/19; patient presents for his 1 week follow-up. He denies any issues. He reports using Hydrofera Blue and has not been using silver collagen for dressing changes. He reports minimal drainage to the wound. He overall feels well. 5/3; patient presents for 2-week follow-up. He has been using silver collagen without issues. He has no complaints today and states he overall feels well. 5/17; patient presents for 2-week follow-up. He states that the sleeve is rubbing up against the back of his right leg and causing a wound. He states this happened a week ago and has not been dressing the wound. The plantar wound is stable and he has been using collagen every other day. 5/24; Patient was had a recent hospitalization for cellulitis of his right popliteal fossa. He was admitted and started on IV antibiotics. He does not recall the plantar wound dressed while inpatient. He feels a lot better today. He was discharged on 5/21 on oral antibiotics and has not picked this up until today. He reports using collagen to the plantar wound since discharge. He is keeping the right posterior knee wound covered. He is getting a new sleeve for his prostatic on 5/26. He currently denies signs of infection. 6/1; patient presents for 1 week follow-up. He is still on doxycycline. He has not been dressing the back of the right knee wound. He states he is receiving his new prosthetic sleeve tomorrow. He states he is using silver alginate to the plantar foot wound. He currently denies signs of infection. He states he has not been offloading the left foot  wound 6/7; patient presents for 1 week follow-up. He completes his last dose of doxycycline today. He finally received his prosthetic sleeve for the right side and he states it feels much better. He has been using antibiotic ointment to the back of that right knee wound. He has been using silver alginate to the plantar foot wound. He currently denies signs of infection. 6/21; patient presents for 2-week follow-up. He reports no issues with his prosthetic sleeve. He has been using antibiotic ointment to the back of the right knee. He has been using silver alginate to the plantar foot wound. He has no issues or complaints today. He denies signs of infection. 7/5; patient presents for 2-week follow-up. He reports  no issues or complaints today. He continues to use antibiotic ointment to the right posterior knee wound and silver alginate to the left plantar foot wound. He denies signs of infection. 7/19; patient presents for 2-week follow-up. He continues to use antibiotic ointment to the right posterior knee and silver alginate to the left plantar foot wound. He has no issues or complaints today. He denies signs of infection. 8/4; patient presents for 2-week follow-up. He has no issues or complaints today. He denies signs of infection. 8/18; patient presents for 2-week follow-up. He has no issues or concerns today. He has been approved for Apligraf and he would like to have this placed today. He denies signs of infection and reports that his right posterior knee wound is closed. 8/25; patient presents for 1 week follow-up. He had no issues with his first Apligraf placement last week. He did however develop an abscess to the back of his right knee. He reports tenderness to this area. He denies systemic signs of infection. 9/1; patient presents for 1 week follow-up. He has had no issues with his plantar foot wound. The Apligraf has stayed in place over the past week. A wound culture was done at last clinic  visit and a new antibiotic was sent to the pharmacy. Patient has not received this yet. He reports some mild tenderness to the back of his right knee. He denies systemic signs of infection. 9/8; patient presents for 1 week follow-up. He states he took Keflex and had diarrhea as a side effect. He was able to complete the course. He reports continued tenderness to the right posterior knee wound. He has some drainage still. He denies increased warmth or erythema to the area. He denies fever/chills or nausea/vomiting. He has no issues or complaints today about his plantar foot wound. 9/15; patient presents for follow-up. He reports improvement in pain to the back of his right leg. He does not report any drainage. He had an ultrasound completed on 9/9 that showed a superficial fluid collection concerning for abscess. We attempted to call patient with results with no answer. We did put an urgent referral to general surgery and he reports he has not received a call. He currently denies systemic signs of infection. He reports no issues to his left plantar foot wound. 9/27; patient presents for follow-up. He has no issues or complaints today. He has had no issues with the previous wound to the back of his right leg. He has not made an appointment to see general surgery. He had Apligraf placed at last clinic visit to the plantar foot wound. He currently denies signs of infection. 10/7; patient presents for follow-up. Upon entering the room there is vomit throughout the entire floor. Patient states he does not feel well. I recommended he go to urgent care. I recommended following up next week for wound care. Electronic Signature(s) Signed: 08/29/2021 10:52:33 AM By: Kalman Shan DO Entered By: Kalman Shan on 08/29/2021 10:48:04 -------------------------------------------------------------------------------- Physical Exam Details Patient Name: Date of Service: [name], [name] 08/29/2021 9:30 A  M Medical Record Number: [id number] Patient Account Number: [id number] Date of Birth/Sex: Treating RN: [birth year] (50 y.o. [name] Primary Care Provider: [name] Other Clinician: Referring Provider: Treating Provider/Extender: Delano Metz in Treatment: 18 Notes Unable to assess due to patient feeling ill. Intake nurse states that the wound is stable and appears well-healing. Vomit was all over the floor. Patient with flat affect Electronic Signature(s) Signed: 08/29/2021 10:52:33 AM  By: Kalman Shan DO Entered By: Kalman Shan on 08/29/2021 10:48:58 -------------------------------------------------------------------------------- Physician Orders Details Patient Name: Date of Service: [name], [name] 08/29/2021 9:30 A M Medical Record Number: [id number] Patient Account Number: [id number] Date of Birth/Sex: Treating RN: 1971-08-05 (50 y.o. [name] Primary Care Provider: [name] Other Clinician: Referring Provider: Treating Provider/Extender: Delano Metz in Treatment: 92 Verbal / Phone Orders: No Diagnosis Coding ICD-10 Coding Code Description [phone] Non-pressure chronic ulcer of other part of left foot with fat layer exposed E11.621 Type 2 diabetes mellitus with foot ulcer S81.801D Unspecified open wound, right lower leg, subsequent encounter E11.42 Type 2 diabetes mellitus with diabetic polyneuropathy Z89.511 Acquired absence of right leg below knee Follow-up Appointments ppointment in 1 week. - with Dr. Heber [place], apligraf next week Return A Edema Control - Lymphedema / SCD / Other Elevate legs to the level of the heart or above for 30 minutes daily and/or when sitting, a frequency of: Avoid standing for long periods of time. Wound Treatment Wound #2R - Metatarsal head first Wound Laterality: Left Cleanser: Soap and Water 1 x Per Week/15 Days Discharge Instructions: May  shower and wash wound with dial antibacterial soap and water prior to dressing change. Cleanser: Wound Cleanser (Generic) 1 x Per Week/15 Days Discharge Instructions: Cleanse the wound with wound cleanser prior to applying a clean dressing using gauze sponges, not tissue or cotton balls. Peri-Wound Care: Sween Lotion (Moisturizing lotion) 1 x Per Week/15 Days Discharge Instructions: Apply moisturizing lotion to leg Prim Dressing: KerraCel Ag Gelling Fiber Dressing, 2x2 in (silver alginate) 1 x Per Week/15 Days ary Discharge Instructions: Apply silver alginate to wound bed as instructed Secondary Dressing: Woven Gauze Sponges 2x2 in (Generic) 1 x Per Week/15 Days Discharge Instructions: Apply over primary dressing as directed. Secondary Dressing: Optifoam Non-Adhesive Dressing, 4x4 in (Generic) 1 x Per Week/15 Days Discharge Instructions: Apply over primary dressing cut to make foam donut to help offload Secured With: Conforming Stretch Gauze Bandage, Sterile 2x75 (in/in) (Generic) 1 x Per Week/15 Days Discharge Instructions: Secure with stretch gauze as directed. Secured With: 48M Medipore H Soft Cloth Surgical Tape, 2x2 (in/yd) (Generic) 1 x Per Week/15 Days Discharge Instructions: Secure dressing with tape as directed. Electronic Signature(s) Signed: 08/29/2021 11:55:30 AM By: Kalman Shan DO Signed: 09/01/2021 4:58:30 PM By: Levan Hurst RN, BSN Entered By: Levan Hurst on 08/29/2021 10:55:31 -------------------------------------------------------------------------------- Problem List Details Patient Name: Date of Service: [name], [name] 08/29/2021 9:30 A M Medical Record Number: [id number] Patient Account Number: [id number] Date of Birth/Sex: Treating RN: June 16, 1971 (50 y.o. [name] Primary Care Provider: [name] Other Clinician: Referring Provider: Treating Provider/Extender: Delano Metz in Treatment: 44 Active  Problems ICD-10 Encounter Code Description Active Date MDM Diagnosis [phone] Non-pressure chronic ulcer of other part of left foot with fat layer exposed 08/16/2020 No Yes E11.621 Type 2 diabetes mellitus with foot ulcer 08/16/2020 No Yes S81.801D Unspecified open wound, right lower leg, subsequent encounter 04/08/2021 No Yes E11.42 Type 2 diabetes mellitus with diabetic polyneuropathy 08/16/2020 No Yes Z89.511 Acquired absence of right leg below knee 08/16/2020 No Yes Inactive Problems Resolved Problems Electronic Signature(s) Signed: 08/29/2021 10:52:33 AM By: Kalman Shan DO Entered By: Kalman Shan on 08/29/2021 10:46:58 -------------------------------------------------------------------------------- Progress Note Details Patient Name: Date of Service: [name], [name] 08/29/2021 9:30 A M Medical Record Number: [id number] Patient Account Number: [id number] Date of Birth/Sex: Treating RN: [birth year] (50 y.o. [name] Primary Care  Provider: [name] Other Clinician: Referring Provider: Treating Provider/Extender: Delano Metz in Treatment: 69 Subjective Chief Complaint Information obtained from Patient patient is here for a review of the wound on his plantar left first metatarsal head and back of his right leg History of Present Illness (HPI) 04/21/18 ADMISSION This is a 50 year old man who is a type II diabetic. In spite of fact his hemoglobin A1c is actually quite good 5.73 months ago he is at a really difficult time over the last 6-7 months. He developed a rapidly progressive infection in the right foot in November 2018 associated with osteomyelitis and necrotizing fasciitis. 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 The stump required and a revision on 11/24/17. The stump revision was advertised as being secondary to falls. I'm not sure the progressive history here however this area is actually closed over. The patient tells me over  the same timeframe he has had wounds on the plantar aspect of his left foot. In the ED saw Dr. Sharol Given in April of this year. 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 It is really not clear that this patient is been dressing this with anything. He came in with the clinic without any specific dressing on the wound areas using his own tennis shoe. He tells me that he only ambulates of course to do a pivot transfer. He does not have his prosthesis for the right leg as of yet and he blames Medicaid for this. He does however use the foot to push himself along in his wheelchair at home. The patient has not had formal arterial studies. ABI in our clinic on the left was 1.13. Patient's past medical history includes type 2 diabetes, fracture of the right fibula. I see that he was treated for abscesses on his right buttock and chest in 2016. I did not look at the microbiology of this. 04/28/18; patient comes back in the clinic today with the wound pretty much the same as when he came in here last week. Small opening lots of undermining relatively. He tells [place] that nothing is really been on this for 3 days in spite of the fact that we gave him enough to dress this easily especially such a small wound. He says he lives with his mother, she is not capable of assisting with this he is changing the dressings himself. 05/05/18; much better-looking wound today. Smaller. There is some undermining medially however that's only perhaps 2 mm. No surrounding erythema. We've been using silver collagen 05/12/18; small wound on the first metatarsal head. No undermining no surrounding erythema. We've been using silver collagen he has home health coming out to change 05/20/18 the wound on the first metatarsal head looks better. Covered in surface debris/callus nonviable tissue. Required debridement but post debridement this looks quite good. We've been using silver collagen. He has home  health 05/27/18; first metatarsal head wound continues to improve. Just about completely closed. Still a lot of surface debridement callus. We've been using silver collagen 06/06/18; the first metatarsal wound is completely healed over. There is still a lot of callus. I gently removed some of this just to make sure that there was no open area and there is not. The patient mentions to me that his left leg has felt like "lead" for about a week READMISSION 08/16/2020 This is a 50 year old man with type 2 diabetes. He returns to clinic today with a 1 month history of  a blister and callus over the first metatarsal head. This is in exactly the same area as when he was here in 2019. 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 He has standard running shoes on the left foot to match the area in his prosthesis. He has only been covering this area with a Band-Aid has not really been specifically dressing this. ABI in our clinic on the left was 1.16. This is essentially stable from the value in 2019 10/1 the patient has a linear wound over the left first metatarsal head. Again not a lot of callus thick skin around this that I removed with a #3 curette. This is not go deep to bone or muscle it does not look to be infected. 10/8 first metatarsal head. The wound looks like it is closing however this is going to be a difficult area to offload in the future. He has a size 15 shoe and he says his shoes are years old. The inserts in these current shoes are totally useless and I have advised him to replace these. He may need a new pair of shoes and I have he got original prosthesis at [employer] [place] I have asked him to go back there. 11/9; the patient has not been here in more than a month. Not sure he was healed when he was here the last time. I told him he would have to get new shoes and certainly offload the area over the left first metatarsal head. He has done nothing  of the kind. He arrives back in clinic with the same old new balance sneakers. A very large separating callus over the first metatarsal head on the left 11/16; he has purchased new shoes and has at least 2 insoles like I asked. The wound is measuring much smaller. He is using silver alginate he changes the dressing himself 11/30; wound is measurably smaller in length of about a half a centimeter. This is generally very little depth. We have been using silver alginate. He changes the dressing himself every second day. 12/14; wound is about the same size. Significant undermining medially relative to the size of the wound. We have been using silver alginate. There is not a way to offload this area as he walks with a prosthesis on the right leg 12/28; wound is about the same size. Again he has undermining medially. I am using polymen on this 12/02/2020; the wound looks smaller. Still a senescent edge. We have been using polymen 1/24; the wound is come down a few millimeters. Still a senescent edge. I have been using polymen 2/8; the wound is come down slightly. Still with slight undermining from 12-6 o'clock I have been using polymen partially to get offloading on this area which is opposed by his prosthesis on the other leg. 2/22 quite open improvement he still has a comma shaped wound on the left first metatarsal head. Some depth. Using polymen 3/14; he still has the same problem of a comma shaped area over the left first metatarsal head. This seems to have had more depth today at 0.6 cm. We have been using polymen. The patient changes this himself every second day. I explored the idea of a total contact cast on the left leg however this is his driving foot. He was not enamored with the idea of not being able to drive and states that he does not have anybody else to get him around  3/28; again he comes in with a smaller looking wound however there is depth and undermining requiring debridement. We have  been using Hydrofera Blue, changed to silver collagen today. The patient is doing the dressing himself 4/11; patient presents today for follow-up of his left diabetic foot wound. He denies any issues in the past 2 weeks. He is currently using silver collagen every other day with dressing changes. He does not use diabetic shoes or special inserts to his sneakers. He does not use felt pads for offloading. 4/19; patient presents for his 1 week follow-up. He denies any issues. He reports using Hydrofera Blue and has not been using silver collagen for dressing changes. He reports minimal drainage to the wound. He overall feels well. 5/3; patient presents for 2-week follow-up. He has been using silver collagen without issues. He has no complaints today and states he overall feels well. 5/17; patient presents for 2-week follow-up. He states that the sleeve is rubbing up against the back of his right leg and causing a wound. He states this happened a week ago and has not been dressing the wound. The plantar wound is stable and he has been using collagen every other day. 5/24; Patient was had a recent hospitalization for cellulitis of his right popliteal fossa. He was admitted and started on IV antibiotics. He does not recall the plantar wound dressed while inpatient. He feels a lot better today. He was discharged on 5/21 on oral antibiotics and has not picked this up until today. He reports using collagen to the plantar wound since discharge. He is keeping the right posterior knee wound covered. He is getting a new sleeve for his prostatic on 5/26. He currently denies signs of infection. 6/1; patient presents for 1 week follow-up. He is still on doxycycline. He has not been dressing the back of the right knee wound. He states he is receiving his new prosthetic sleeve tomorrow. He states he is using silver alginate to the plantar foot wound. He currently denies signs of infection. He states he has not been  offloading the left foot wound 6/7; patient presents for 1 week follow-up. He completes his last dose of doxycycline today. He finally received his prosthetic sleeve for the right side and he states it feels much better. He has been using antibiotic ointment to the back of that right knee wound. He has been using silver alginate to the plantar foot wound. He currently denies signs of infection. 6/21; patient presents for 2-week follow-up. He reports no issues with his prosthetic sleeve. He has been using antibiotic ointment to the back of the right knee. He has been using silver alginate to the plantar foot wound. He has no issues or complaints today. He denies signs of infection. 7/5; patient presents for 2-week follow-up. He reports no issues or complaints today. He continues to use antibiotic ointment to the right posterior knee wound and silver alginate to the left plantar foot wound. He denies signs of infection. 7/19; patient presents for 2-week follow-up. He continues to use antibiotic ointment to the right posterior knee and silver alginate to the left plantar foot wound. He has no issues or complaints today. He denies signs of infection. 8/4; patient presents for 2-week follow-up. He has no issues or complaints today. He denies signs of infection. 8/18; patient presents for 2-week follow-up. He has no issues or concerns today. He has been approved for Apligraf and he would like to have this placed today. He denies signs of  infection and reports that his right posterior knee wound is closed. 8/25; patient presents for 1 week follow-up. He had no issues with his first Apligraf placement last week. He did however develop an abscess to the back of his right knee. He reports tenderness to this area. He denies systemic signs of infection. 9/1; patient presents for 1 week follow-up. He has had no issues with his plantar foot wound. The Apligraf has stayed in place over the past week. A  wound culture was done at last clinic visit and a new antibiotic was sent to the pharmacy. Patient has not received this yet. He reports some mild tenderness to the back of his right knee. He denies systemic signs of infection. 9/8; patient presents for 1 week follow-up. He states he took Keflex and had diarrhea as a side effect. He was able to complete the course. He reports continued tenderness to the right posterior knee wound. He has some drainage still. He denies increased warmth or erythema to the area. He denies fever/chills or nausea/vomiting. He has no issues or complaints today about his plantar foot wound. 9/15; patient presents for follow-up. He reports improvement in pain to the back of his right leg. He does not report any drainage. He had an ultrasound completed on 9/9 that showed a superficial fluid collection concerning for abscess. We attempted to call patient with results with no answer. We did put an urgent referral to general surgery and he reports he has not received a call. He currently denies systemic signs of infection. He reports no issues to his left plantar foot wound. 9/27; patient presents for follow-up. He has no issues or complaints today. He has had no issues with the previous wound to the back of his right leg. He has not made an appointment to see general surgery. He had Apligraf placed at last clinic visit to the plantar foot wound. He currently denies signs of infection. 10/7; patient presents for follow-up. Upon entering the room there is vomit throughout the entire floor. Patient states he does not feel well. I recommended he go to urgent care. I recommended following up next week for wound care. Patient History Information obtained from Patient. Family History Diabetes - Maternal Grandparents,Mother, Heart Disease - Father, Hypertension - Father, No family history of Cancer, Hereditary Spherocytosis, Kidney Disease, Lung Disease, Seizures, Stroke, Thyroid  Problems, Tuberculosis. Social History Current every day smoker, Marital Status - Single, Alcohol Use - Never, Drug Use - No History, Caffeine Use - Never. Medical History Eyes Denies history of Cataracts, Glaucoma, Optic Neuritis Ear/Nose/Mouth/Throat Denies history of Chronic sinus problems/congestion, Middle ear problems Hematologic/Lymphatic Patient has history of Anemia Denies history of Hemophilia, Human Immunodeficiency Virus, Lymphedema, Sickle Cell Disease Respiratory Denies history of Aspiration, Asthma, Chronic Obstructive Pulmonary Disease (COPD), Pneumothorax, Sleep Apnea, Tuberculosis Cardiovascular Patient has history of Hypertension, Peripheral Venous Disease Denies history of Angina, Arrhythmia, Congestive Heart Failure, Coronary Artery Disease, Deep Vein Thrombosis, Hypotension, Myocardial Infarction, Peripheral Arterial Disease, Phlebitis, Vasculitis Gastrointestinal Denies history of Cirrhosis , Colitis, Crohnoos, Hepatitis A, Hepatitis B, Hepatitis C Endocrine Patient has history of Type II Diabetes - oral meds Denies history of Type I Diabetes Genitourinary Denies history of End Stage Renal Disease Immunological Denies history of Lupus Erythematosus, Raynaudoos, Scleroderma Integumentary (Skin) Denies history of History of Burn Musculoskeletal Denies history of Gout, Rheumatoid Arthritis, Osteoarthritis, Osteomyelitis Neurologic Denies history of Dementia, Neuropathy, Quadriplegia, Paraplegia, Seizure Disorder Oncologic Denies history of Received Chemotherapy, Received Radiation Psychiatric Denies history of Anorexia/bulimia,  Confinement Anxiety Hospitalization/Surgery History - fever 105, sepsis cellulitis right popliteal fossa 04/09/2021. Medical A Surgical History Notes nd Constitutional Symptoms (General Health) falls , leukocytosis Respiratory During waking hours and catches himself not breathing, "gasp for air". Saw Dr Wynonia Lawman, he couldn't find a  reason Objective Constitutional Vitals Time Taken: 10:10 AM, Height: 77 in, Weight: 232 lbs, BMI: 27.5, Temperature: 97.9 F, Pulse: 108 bpm, Respiratory Rate: 17 breaths/min, Blood Pressure: 187/98 mmHg. Integumentary (Hair, Skin) Wound #2R status is Open. Original cause of wound was Blister. The date acquired was: 06/23/2020. The wound has been in treatment 54 weeks. The wound is located on the Left Metatarsal head first. The wound measures 0.2cm length x 0.2cm width x 0.2cm depth; 0.031cm^2 area and 0.006cm^3 volume. There is Fat Layer (Subcutaneous Tissue) exposed. There is a small amount of serosanguineous drainage noted. The wound margin is well defined and not attached to the wound base. There is large (67-100%) red granulation within the wound bed. There is no necrotic tissue within the wound bed. 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 Patient has Apligraf in place and was scheduled for skin substitute replacement today. This would be difficult to do especially since patient continues to feel ill. We switched and put a temporary silver alginate dressing to the wound bed. He agrees to follow-up when he feels better next week for Apligraf placement. Of note his blood glucose was 420 on exam. I advise going to the ED to evaluate for an infectious etiology. Patient expressed understanding and states he will go today. Plan 1. Follow-up next week after feeling better for wound care And skin substitute placement 2. Go to the ED for evaluation Electronic Signature(s) Signed: 08/29/2021 10:52:33 AM By: Kalman Shan DO Entered By: Kalman Shan on 08/29/2021  10:51:42 -------------------------------------------------------------------------------- HxROS Details Patient Name: Date of Service: [name], [name] 08/29/2021 9:30 A M Medical Record Number: [id number] Patient Account Number: [id number] Date of Birth/Sex: Treating RN: Nov 04, 1971 (50 y.o. [name] Primary Care Provider: [name] Other Clinician: Referring Provider: Treating Provider/Extender: Delano Metz in Treatment: 62 Information Obtained From Patient Constitutional Symptoms (General Health) Medical History: Past Medical History Notes: falls , leukocytosis Eyes Medical History: Negative for: Cataracts; Glaucoma; Optic Neuritis Ear/Nose/Mouth/Throat Medical History: Negative for: Chronic sinus problems/congestion; Middle ear problems Hematologic/Lymphatic Medical History: Positive for: Anemia Negative for: Hemophilia; Human Immunodeficiency Virus; Lymphedema; Sickle Cell Disease Respiratory Medical History: Negative for: Aspiration; Asthma; Chronic Obstructive Pulmonary Disease (COPD); Pneumothorax; Sleep Apnea; Tuberculosis Past Medical History Notes: During waking hours and catches himself not breathing, "gasp for air". Saw Dr Wynonia Lawman, he couldn't find a reason Cardiovascular Medical History: Positive for: Hypertension; Peripheral Venous Disease Negative for: Angina; Arrhythmia; Congestive Heart Failure; Coronary Artery Disease; Deep Vein Thrombosis; Hypotension; Myocardial Infarction; Peripheral Arterial Disease; Phlebitis; Vasculitis Gastrointestinal Medical History: Negative for: Cirrhosis ; Colitis; Crohns; Hepatitis A; Hepatitis B; Hepatitis C Endocrine Medical History: Positive for: Type II Diabetes - oral meds Negative for: Type I Diabetes Time with diabetes: 2014 Treated with: Oral agents Blood sugar tested every day: Yes Tested : Blood sugar testing results: Breakfast: 91 Genitourinary Medical  History: Negative for: End Stage Renal Disease Immunological Medical History: Negative for: Lupus Erythematosus; Raynauds;  Scleroderma Integumentary (Skin) Medical History: Negative for: History of Burn Musculoskeletal Medical History: Negative for: Gout; Rheumatoid Arthritis; Osteoarthritis; Osteomyelitis Neurologic Medical History: Negative for: Dementia; Neuropathy; Quadriplegia; Paraplegia; Seizure Disorder Oncologic Medical History: Negative for: Received Chemotherapy; Received Radiation Psychiatric Medical History: Negative for: Anorexia/bulimia; Confinement Anxiety Immunizations Pneumococcal Vaccine: Received Pneumococcal Vaccination: No Implantable Devices No devices added Hospitalization / Surgery History Type of Hospitalization/Surgery fever 105, sepsis cellulitis right popliteal fossa 04/09/2021 Family and Social History Cancer: No; Diabetes: Yes - Maternal Grandparents,Mother; Heart Disease: Yes - Father; Hereditary Spherocytosis: No; Hypertension: Yes - Father; Kidney Disease: No; Lung Disease: No; Seizures: No; Stroke: No; Thyroid Problems: No; Tuberculosis: No; Current every day smoker; Marital Status - Single; Alcohol Use: Never; Drug Use: No History; Caffeine Use: Never; Financial Concerns: No; Food, Clothing or Shelter Needs: No; Support System Lacking: No; Transportation Concerns: No Electronic Signature(s) Signed: 08/29/2021 10:52:33 AM By: Kalman Shan DO Signed: 08/29/2021 12:34:01 PM By: Baruch Gouty RN, BSN Entered By: Kalman Shan on 08/29/2021 10:48:11 -------------------------------------------------------------------------------- SuperBill Details Patient Name: Date of Service: [name], [name] 08/29/2021 Medical Record Number: [id number] Patient Account Number: [id number] Date of Birth/Sex: Treating RN: 12/14/1970 (50 y.o. [name] Primary Care Provider: [name] Other Clinician: Referring Provider: Treating  Provider/Extender: Delano Metz in Treatment: 56 Diagnosis Coding ICD-10 Codes Code Description [phone] Non-pressure chronic ulcer of other part of left foot with fat layer exposed E11.621 Type 2 diabetes mellitus with foot ulcer S81.801D Unspecified open wound, right lower leg, subsequent encounter E11.42 Type 2 diabetes mellitus with diabetic polyneuropathy Z89.511 Acquired absence of right leg below knee Facility Procedures CPT4 Code: [id number] Description: 99213 - WOUND CARE VISIT-LEV 3 EST PT Modifier: Quantity: 1 Physician Procedures : CPT4 Code Description Modifier [id number] - WC PHYS LEVEL 2 - EST PT ICD-10 Diagnosis Description L97.522 Non-pressure chronic ulcer of other part of left foot with fat layer exposed E11.621 Type 2 diabetes mellitus with foot ulcer S81.801D  Unspecified open wound, right lower leg, subsequent encounter E11.42 Type 2 diabetes mellitus with diabetic polyneuropathy Quantity: 1 Electronic Signature(s) Signed: 08/29/2021 12:36:44 PM By: Kalman Shan DO Signed: 09/01/2021 4:58:30 PM By: Levan Hurst RN, BSN Previous Signature: 08/29/2021 10:52:33 AM Version By: Kalman Shan DO Entered By: Levan Hurst on 08/29/2021 12:31:17

## 2021-09-01 NOTE — ED Triage Notes (Signed)
Pt reports hyperglycemia since Friday with hematemesis and fever as high as 101.3. Pt reports mild abd pain and sore throat. Denies diarrhea.

## 2021-09-01 NOTE — ED Provider Notes (Signed)
Emergency Medicine Provider Triage Evaluation Note  AIDRIC ENDICOTT , a 50 y.o. male  was evaluated in triage.  Pt complains of hyperglycemia, fever, hematemesis, some mild abdominal pain and sore throat fatigue no chest pain or shortness of breath.  Review of Systems  Positive: Fatigue, hematemesis, sore throat Negative: Chest pain  Physical Exam  BP 101/82 (BP Location: Left Arm)   Pulse (!) 101   Temp 99.4 F (37.4 C) (Oral)   Resp 19   SpO2 98%  Gen:   Awake, no distress   Resp:  Normal effort  MSK:   Moves extremities without difficulty  Other:  Abdomen soft nontender.  Medical Decision Making  Medically screening exam initiated at 7:28 PM.  Appropriate orders placed.  [name] was informed that the remainder of the evaluation will be completed by another provider, this initial triage assessment does not replace that evaluation, and the importance of remaining in the ED until their evaluation is complete.  Patient is a 49 year old diabetic male presented today to the ER for symptoms listed above.  Will obtain labs chest x-ray urinalysis looking for infectious cause of patient's hyperglycemia.   Pati Gallo [place], [place] 09/01/21 1929    Sherwood Gambler, MD 09/02/21 1515

## 2021-09-02 ENCOUNTER — Inpatient Hospital Stay (HOSPITAL_COMMUNITY): Payer: Medicare Other | Admitting: Certified Registered"

## 2021-09-02 ENCOUNTER — Encounter (HOSPITAL_COMMUNITY)
Admission: EM | Disposition: A | Discharge status: Home / Self Care | Payer: Self-pay | Source: Home / Self Care | Attending: Internal Medicine

## 2021-09-02 ENCOUNTER — Emergency Department (HOSPITAL_COMMUNITY): Payer: Medicare Other

## 2021-09-02 ENCOUNTER — Encounter (HOSPITAL_COMMUNITY): Payer: Self-pay | Admitting: Internal Medicine

## 2021-09-02 DIAGNOSIS — Z91048 Other nonmedicinal substance allergy status: Secondary | ICD-10-CM | POA: Diagnosis not present

## 2021-09-02 DIAGNOSIS — F32A Depression, unspecified: Secondary | ICD-10-CM | POA: Diagnosis present

## 2021-09-02 DIAGNOSIS — E872 Acidosis, unspecified: Secondary | ICD-10-CM | POA: Diagnosis present

## 2021-09-02 DIAGNOSIS — Z89511 Acquired absence of right leg below knee: Secondary | ICD-10-CM | POA: Diagnosis not present

## 2021-09-02 DIAGNOSIS — K2100 Gastro-esophageal reflux disease with esophagitis, without bleeding: Secondary | ICD-10-CM | POA: Diagnosis present

## 2021-09-02 DIAGNOSIS — R6510 Systemic inflammatory response syndrome (SIRS) of non-infectious origin without acute organ dysfunction: Secondary | ICD-10-CM | POA: Diagnosis present

## 2021-09-02 DIAGNOSIS — K2101 Gastro-esophageal reflux disease with esophagitis, with bleeding: Secondary | ICD-10-CM | POA: Diagnosis present

## 2021-09-02 DIAGNOSIS — E1122 Type 2 diabetes mellitus with diabetic chronic kidney disease: Secondary | ICD-10-CM | POA: Diagnosis present

## 2021-09-02 DIAGNOSIS — Z833 Family history of diabetes mellitus: Secondary | ICD-10-CM | POA: Diagnosis not present

## 2021-09-02 DIAGNOSIS — N1832 Chronic kidney disease, stage 3b: Secondary | ICD-10-CM | POA: Diagnosis present

## 2021-09-02 DIAGNOSIS — F419 Anxiety disorder, unspecified: Secondary | ICD-10-CM | POA: Diagnosis present

## 2021-09-02 DIAGNOSIS — I129 Hypertensive chronic kidney disease with stage 1 through stage 4 chronic kidney disease, or unspecified chronic kidney disease: Secondary | ICD-10-CM | POA: Diagnosis present

## 2021-09-02 DIAGNOSIS — E1142 Type 2 diabetes mellitus with diabetic polyneuropathy: Secondary | ICD-10-CM | POA: Diagnosis present

## 2021-09-02 DIAGNOSIS — N179 Acute kidney failure, unspecified: Secondary | ICD-10-CM | POA: Diagnosis present

## 2021-09-02 DIAGNOSIS — K920 Hematemesis: Secondary | ICD-10-CM | POA: Diagnosis present

## 2021-09-02 DIAGNOSIS — E1165 Type 2 diabetes mellitus with hyperglycemia: Secondary | ICD-10-CM | POA: Diagnosis present

## 2021-09-02 DIAGNOSIS — Z794 Long term (current) use of insulin: Secondary | ICD-10-CM

## 2021-09-02 DIAGNOSIS — I10 Essential (primary) hypertension: Secondary | ICD-10-CM | POA: Diagnosis not present

## 2021-09-02 DIAGNOSIS — K209 Esophagitis, unspecified without bleeding: Secondary | ICD-10-CM | POA: Diagnosis not present

## 2021-09-02 DIAGNOSIS — E785 Hyperlipidemia, unspecified: Secondary | ICD-10-CM | POA: Diagnosis present

## 2021-09-02 DIAGNOSIS — N178 Other acute kidney failure: Secondary | ICD-10-CM | POA: Diagnosis not present

## 2021-09-02 DIAGNOSIS — E861 Hypovolemia: Secondary | ICD-10-CM | POA: Diagnosis present

## 2021-09-02 DIAGNOSIS — Z818 Family history of other mental and behavioral disorders: Secondary | ICD-10-CM | POA: Diagnosis not present

## 2021-09-02 DIAGNOSIS — D509 Iron deficiency anemia, unspecified: Secondary | ICD-10-CM | POA: Diagnosis present

## 2021-09-02 DIAGNOSIS — K219 Gastro-esophageal reflux disease without esophagitis: Secondary | ICD-10-CM | POA: Diagnosis present

## 2021-09-02 DIAGNOSIS — Z79899 Other long term (current) drug therapy: Secondary | ICD-10-CM | POA: Diagnosis not present

## 2021-09-02 DIAGNOSIS — Z8249 Family history of ischemic heart disease and other diseases of the circulatory system: Secondary | ICD-10-CM | POA: Diagnosis not present

## 2021-09-02 DIAGNOSIS — F17210 Nicotine dependence, cigarettes, uncomplicated: Secondary | ICD-10-CM | POA: Diagnosis present

## 2021-09-02 DIAGNOSIS — K3189 Other diseases of stomach and duodenum: Secondary | ICD-10-CM

## 2021-09-02 DIAGNOSIS — Z20822 Contact with and (suspected) exposure to covid-19: Secondary | ICD-10-CM | POA: Diagnosis present

## 2021-09-02 HISTORY — PX: BIOPSY: SHX5522

## 2021-09-02 HISTORY — PX: ESOPHAGOGASTRODUODENOSCOPY (EGD) WITH PROPOFOL: SHX5813

## 2021-09-02 HISTORY — DX: Gastro-esophageal reflux disease with esophagitis, without bleeding: K21.00

## 2021-09-02 LAB — CBC WITH DIFFERENTIAL/PLATELET
Abs Immature Granulocytes: 0.03 10*3/uL (ref 0.00–0.07)
Basophils Absolute: 0 10*3/uL (ref 0.0–0.1)
Basophils Relative: 0 %
Eosinophils Absolute: 0.1 10*3/uL (ref 0.0–0.5)
Eosinophils Relative: 1 %
HCT: 30.8 % — ABNORMAL LOW (ref 39.0–52.0)
Hemoglobin: 9.8 g/dL — ABNORMAL LOW (ref 13.0–17.0)
Immature Granulocytes: 0 %
Lymphocytes Relative: 25 %
Lymphs Abs: 2.1 10*3/uL (ref 0.7–4.0)
MCH: 28.6 pg (ref 26.0–34.0)
MCHC: 31.8 g/dL (ref 30.0–36.0)
MCV: 89.8 fL (ref 80.0–100.0)
Monocytes Absolute: 0.6 10*3/uL (ref 0.1–1.0)
Monocytes Relative: 7 %
Neutro Abs: 5.5 10*3/uL (ref 1.7–7.7)
Neutrophils Relative %: 67 %
Platelets: 191 10*3/uL (ref 150–400)
RBC: 3.43 MIL/uL — ABNORMAL LOW (ref 4.22–5.81)
RDW: 14.1 % (ref 11.5–15.5)
WBC: 8.3 10*3/uL (ref 4.0–10.5)
nRBC: 0 % (ref 0.0–0.2)

## 2021-09-02 LAB — URINALYSIS, ROUTINE W REFLEX MICROSCOPIC
Bilirubin Urine: NEGATIVE
Glucose, UA: >=500 mg/dL — AB
Ketones, ur: NEGATIVE mg/dL
Leukocytes,Ua: NEGATIVE
Nitrite: NEGATIVE
Protein, ur: 100 mg/dL — AB
Specific Gravity, Urine: 1.007 (ref 1.005–1.030)
pH: 5 (ref 5.0–8.0)

## 2021-09-02 LAB — MAGNESIUM: Magnesium: 1.7 mg/dL (ref 1.7–2.4)

## 2021-09-02 LAB — COMPREHENSIVE METABOLIC PANEL
ALT: 9 U/L (ref 0–44)
AST: 11 U/L — ABNORMAL LOW (ref 15–41)
Albumin: 2.5 g/dL — ABNORMAL LOW (ref 3.5–5.0)
Alkaline Phosphatase: 101 U/L (ref 38–126)
Anion gap: 11 (ref 5–15)
BUN: 41 mg/dL — ABNORMAL HIGH (ref 6–20)
CO2: 26 mmol/L (ref 22–32)
Calcium: 7.6 mg/dL — ABNORMAL LOW (ref 8.9–10.3)
Chloride: 99 mmol/L (ref 98–111)
Creatinine, Ser: 3.68 mg/dL — ABNORMAL HIGH (ref 0.61–1.24)
GFR, Estimated: 19 mL/min — ABNORMAL LOW (ref >60)
Glucose, Bld: 163 mg/dL — ABNORMAL HIGH (ref 70–99)
Potassium: 3.3 mmol/L — ABNORMAL LOW (ref 3.5–5.1)
Sodium: 136 mmol/L (ref 135–145)
Total Bilirubin: 0.8 mg/dL (ref 0.3–1.2)
Total Protein: 5.5 g/dL — ABNORMAL LOW (ref 6.5–8.1)

## 2021-09-02 LAB — CBC
HCT: 32.4 % — ABNORMAL LOW (ref 39.0–52.0)
HCT: 33.5 % — ABNORMAL LOW (ref 39.0–52.0)
Hemoglobin: 10.2 g/dL — ABNORMAL LOW (ref 13.0–17.0)
Hemoglobin: 10.4 g/dL — ABNORMAL LOW (ref 13.0–17.0)
MCH: 28.1 pg (ref 26.0–34.0)
MCH: 28 pg (ref 26.0–34.0)
MCHC: 31.5 g/dL (ref 30.0–36.0)
MCHC: 31 g/dL (ref 30.0–36.0)
MCV: 89.3 fL (ref 80.0–100.0)
MCV: 90.3 fL (ref 80.0–100.0)
Platelets: 187 10*3/uL (ref 150–400)
Platelets: 203 10*3/uL (ref 150–400)
RBC: 3.63 MIL/uL — ABNORMAL LOW (ref 4.22–5.81)
RBC: 3.71 MIL/uL — ABNORMAL LOW (ref 4.22–5.81)
RDW: 13.9 % (ref 11.5–15.5)
RDW: 14 % (ref 11.5–15.5)
WBC: 8 10*3/uL (ref 4.0–10.5)
WBC: 11.5 10*3/uL — ABNORMAL HIGH (ref 4.0–10.5)
nRBC: 0 % (ref 0.0–0.2)
nRBC: 0 % (ref 0.0–0.2)

## 2021-09-02 LAB — CBG MONITORING, ED
Glucose-Capillary: 81 mg/dL (ref 70–99)
Glucose-Capillary: 167 mg/dL — ABNORMAL HIGH (ref 70–99)
Glucose-Capillary: 88 mg/dL (ref 70–99)
Glucose-Capillary: 91 mg/dL (ref 70–99)
Glucose-Capillary: 212 mg/dL — ABNORMAL HIGH (ref 70–99)

## 2021-09-02 LAB — HEMOGLOBIN A1C
Hgb A1c MFr Bld: 8.5 % — ABNORMAL HIGH (ref 4.8–5.6)
Mean Plasma Glucose: 197.25 mg/dL

## 2021-09-02 LAB — LACTIC ACID, PLASMA
Lactic Acid, Venous: 3.5 mmol/L (ref 0.5–1.9)
Lactic Acid, Venous: 3 mmol/L (ref 0.5–1.9)
Lactic Acid, Venous: 0.9 mmol/L (ref 0.5–1.9)

## 2021-09-02 LAB — GROUP A STREP BY PCR: Group A Strep by PCR: NOT DETECTED

## 2021-09-02 SURGERY — ESOPHAGOGASTRODUODENOSCOPY (EGD) WITH PROPOFOL
Anesthesia: Monitor Anesthesia Care

## 2021-09-02 MED ORDER — LACTATED RINGERS IV BOLUS
1000.0000 mL | Freq: Once | INTRAVENOUS | Status: AC
  Administered 2021-09-02: 1000 mL via INTRAVENOUS

## 2021-09-02 MED ORDER — PROPOFOL 10 MG/ML IV BOLUS
INTRAVENOUS | Status: DC | PRN
  Administered 2021-09-02 (×2): 30 mg via INTRAVENOUS
  Administered 2021-09-02 (×2): 20 mg via INTRAVENOUS

## 2021-09-02 MED ORDER — SODIUM CHLORIDE 0.9 % IV SOLN: 2.0000 g | Freq: Two times a day (BID) | INTRAVENOUS | Status: DC

## 2021-09-02 MED ORDER — ONDANSETRON HCL 4 MG/2ML IJ SOLN
4.0000 mg | Freq: Four times a day (QID) | INTRAMUSCULAR | Status: DC | PRN
  Administered 2021-09-02: 4 mg via INTRAVENOUS
  Filled 2021-09-02: qty 2

## 2021-09-02 MED ORDER — KCL-LACTATED RINGERS 20 MEQ/L IV SOLN
INTRAVENOUS | Status: DC
  Filled 2021-09-02 (×2): qty 1000

## 2021-09-02 MED ORDER — INSULIN ASPART 100 UNIT/ML IJ SOLN
0.0000 [IU] | Freq: Three times a day (TID) | INTRAMUSCULAR | Status: DC
  Administered 2021-09-02: 3 [IU] via SUBCUTANEOUS
  Administered 2021-09-02: 5 [IU] via SUBCUTANEOUS
  Administered 2021-09-03: 3 [IU] via SUBCUTANEOUS

## 2021-09-02 MED ORDER — METOCLOPRAMIDE HCL 5 MG/ML IJ SOLN
10.0000 mg | Freq: Once | INTRAMUSCULAR | Status: AC
  Administered 2021-09-02: 10 mg via INTRAVENOUS
  Filled 2021-09-02: qty 2

## 2021-09-02 MED ORDER — ONDANSETRON HCL 4 MG PO TABS: 4.0000 mg | ORAL_TABLET | Freq: Four times a day (QID) | ORAL | Status: DC | PRN

## 2021-09-02 MED ORDER — FLUTICASONE PROPIONATE 50 MCG/ACT NA SUSP: 2.0000 | Freq: Every day | NASAL | Status: DC | PRN

## 2021-09-02 MED ORDER — ACETAMINOPHEN 650 MG RE SUPP: 650.0000 mg | Freq: Four times a day (QID) | RECTAL | Status: DC | PRN

## 2021-09-02 MED ORDER — SUCRALFATE 1 GM/10ML PO SUSP
1.0000 g | Freq: Three times a day (TID) | ORAL | Status: DC
  Administered 2021-09-02 – 2021-09-03 (×3): 1 g via ORAL
  Filled 2021-09-02 (×3): qty 10

## 2021-09-02 MED ORDER — PROPOFOL 500 MG/50ML IV EMUL
INTRAVENOUS | Status: DC | PRN
  Administered 2021-09-02: 75 ug/kg/min via INTRAVENOUS

## 2021-09-02 MED ORDER — PANTOPRAZOLE 80MG IVPB - SIMPLE MED
80.0000 mg | Freq: Once | INTRAVENOUS | Status: AC
  Administered 2021-09-02: 80 mg via INTRAVENOUS
  Filled 2021-09-02: qty 80

## 2021-09-02 MED ORDER — ACETAMINOPHEN 325 MG PO TABS: 650.0000 mg | ORAL_TABLET | Freq: Four times a day (QID) | ORAL | Status: DC | PRN

## 2021-09-02 MED ORDER — LACTATED RINGERS IV SOLN: INTRAVENOUS | Status: DC

## 2021-09-02 MED ORDER — SODIUM CHLORIDE 0.9 % IV SOLN
2.0000 g | Freq: Once | INTRAVENOUS | Status: AC
  Administered 2021-09-02: 2 g via INTRAVENOUS
  Filled 2021-09-02: qty 2

## 2021-09-02 MED ORDER — VANCOMYCIN HCL 2000 MG/400ML IV SOLN
2000.0000 mg | Freq: Once | INTRAVENOUS | Status: AC
  Administered 2021-09-02: 2000 mg via INTRAVENOUS
  Filled 2021-09-02: qty 400

## 2021-09-02 MED ORDER — VANCOMYCIN HCL 2000 MG/400ML IV SOLN: 2000.0000 mg | INTRAVENOUS | Status: DC

## 2021-09-02 MED ORDER — POTASSIUM CHLORIDE 2 MEQ/ML IV SOLN
INTRAVENOUS | Status: DC
  Filled 2021-09-02 (×4): qty 1000

## 2021-09-02 MED ORDER — SODIUM CHLORIDE 0.9 % IV SOLN: INTRAVENOUS | Status: DC | PRN

## 2021-09-02 MED ORDER — HYDRALAZINE HCL 20 MG/ML IJ SOLN: 10.0000 mg | Freq: Four times a day (QID) | INTRAMUSCULAR | Status: DC | PRN

## 2021-09-02 MED ORDER — PANTOPRAZOLE SODIUM 40 MG IV SOLR
40.0000 mg | Freq: Two times a day (BID) | INTRAVENOUS | Status: DC
  Administered 2021-09-02 – 2021-09-03 (×2): 40 mg via INTRAVENOUS
  Filled 2021-09-02 (×2): qty 40

## 2021-09-02 SURGICAL SUPPLY — 15 items

## 2021-09-02 NOTE — Assessment & Plan Note (Signed)
   Patient reports multiple bouts of hematemesis over the course of the past several days  Evidence of substantial esophagitis on CT imaging of which the patient likely has friable mucosa that is bleeding with the patient's frequent vomiting  Hemoglobin 13 days ago was 12.5, decreased to 11.3 on arrival to the emergency department and now 9.8 CBCs  Patient placed on intravenous Protonix every 12 hours  Perform serial CBCs every 6 hours  Monitoring for hemodynamic instability  Discussed case with Azucena Freed with [facility] gastroenterology who has agreed to see the patient in consultation.  Their assistance is appreciated.

## 2021-09-02 NOTE — Progress Notes (Signed)
Report received. Room ready.  

## 2021-09-02 NOTE — Assessment & Plan Note (Signed)
   Circumferential thickening of the esophagus concerning for substantial esophagitis with surrounding stranding CT imaging  This is presumably secondary to GERD considering the dramatic presentation with multiple reported bouts of hematemesis and concurrent substantial odynophagia and inability to tolerate oral intake and endoscopic work-up is likely indicated.  Patient's been placed on intravenous Protonix every 12  Consulting gastroenterology for formal consultation and consideration of endoscopic work-up  Will keep patient n.p.o. for now until patient's symptoms improve then will slowly advance diet as tolerated once cleared by GI

## 2021-09-02 NOTE — Assessment & Plan Note (Signed)
   Patient presenting with substantial lactic acidosis, presumably secondary to severe volume depletion  Hydrating patient aggressively with intravenous isotonic fluids   no obvious evidence of infection  Performing serial lactic acid levels to ensure downtrending and resolution

## 2021-09-02 NOTE — Anesthesia Procedure Notes (Signed)
Procedure Name: MAC Date/Time: 09/02/2021 2:26 PM Performed by: Carolan Clines, CRNA Pre-anesthesia Checklist: Patient identified, Emergency Drugs available, Suction available and Patient being monitored Patient Re-evaluated:Patient Re-evaluated prior to induction Oxygen Delivery Method: Nasal cannula Dental Injury: Teeth and Oropharynx as per pre-operative assessment

## 2021-09-02 NOTE — H&P (Signed)
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  Patient explains that this past Friday he began to experience intense nausea with frequent episodes of vomiting.  Patient explains that his vomitus was intermittently mixed with dark red blood, occasionally in clots that "looked like grapes."  Patient symptoms of nausea and frequent bouts of hematemesis continued to persist into Saturday and even into Sunday although by this time started to improve slightly.  Over this course of time, patient was experiencing severe pain in his chest and throat.  Patient describes this pain as "burning" in quality preventing him from being able to ingest solids and most liquids.  Patient also had the sensation that food was getting "stuck" in his throat.  By Monday patient developed a number of other symptoms including left-sided abdominal pain, moderate intensity, dull in quality.  Patient additionally experiencing full bouts of watery, occasionally bloody diarrhea.    It must also be noted that patient also experienced intermittent fevers throughout the course of his illness with temperatures as high as 101.3 F.  Patient denies alcohol use, use of NSAIDs or recent antibiotic use.  Patient also noticed that he was extremely hyperglycemic throughout this illness. Because of this constellation of symptoms the patient eventually presented to  [facility] emergency department for evaluation.  Upon evaluation in the emergency department initial blood work revealed leukocytosis of 12.8 with notable evidence of acute kidney injury with creatinine of 3.61, up from previous recorded value of 2.25.  Patient additionally was found to have a substantial lactic acidosis of 3.5.  Patient underwent CT imaging of the abdomen and pelvis revealing circumferential wall thickening and paraesophageal inflammatory stranding given the appearance of esophagitis.  Patient was initiated on intravenous fluids.  The hospitalist group was then called to assess the patient for admission to the hospital.  Review of Systems:   Review of Systems  Constitutional:  Positive for malaise/fatigue.  Gastrointestinal:  Positive for abdominal pain, diarrhea, heartburn, nausea and vomiting.  Neurological:  Positive for weakness.  All other systems reviewed and are negative.  Past Medical History:  Diagnosis Date   Allergy    seasonal and environmental   Anemia    ARF (acute renal failure) ([place]) 09/2017   Chronic constipation 05/13/2020   Depression    Diabetes mellitus without complication ([place]) 8563   Hyperlipidemia    Hypertension    Necrotizing fasciitis ([place]) 10/13/2017   Neuromuscular disorder ([place])    neuropathy   Wound dehiscence 11/24/2017    Past Surgical History:  Procedure Laterality Date   AMPUTATION Right 10/13/2017   Procedure: RIGHT BELOW KNEE AMPUTATION;  Surgeon: [name], MD;  Location: [place];  Service: Orthopedics;  Laterality: Right;   AMPUTATION Right 11/24/2017   Procedure: AMPUTATION BELOW KNEE REVISION;  Surgeon: [name], MD;  Location: [place];  Service: Orthopedics;  Laterality: Right;   COLONOSCOPY  05/11/2019   NO PAST SURGERIES  POLYPECTOMY     WISDOM TOOTH EXTRACTION       reports that he has been smoking cigarettes. He has a 8.75 pack-year smoking history. He has never used smokeless tobacco. He reports  that he does not drink alcohol and does not use drugs.  Allergies  Allergen Reactions   Adhesive [Tape] Rash    Rash at site of tape--okay to use paper tape    Family History  Problem Relation Age of Onset   Diabetes Mellitus II Mother    Depression Mother    Colon polyps Mother    Heart disease Father        CABG x 4.  04/2017   Colon cancer Neg Hx    Esophageal cancer Neg Hx    Liver cancer Neg Hx    Pancreatic cancer Neg Hx    Rectal cancer Neg Hx    Stomach cancer Neg Hx      Prior to Admission medications   Medication Sig Start Date End Date Taking? Authorizing Provider  Truddie Crumble ULTRA-THIN LANCETS MISC Check blood sugars before meals twice daily 11/19/17   Mack Hook, MD  amLODipine (NORVASC) 10 MG tablet Take 10 mg by mouth every morning.     [provider]  ARIPiprazole (ABILIFY) 20 MG tablet Take 20 mg by mouth every morning. 08/06/19   [provider]  atorvastatin (LIPITOR) 20 MG tablet Take 20 mg by mouth every morning. 06/07/19   [provider]  baclofen (LIORESAL) 10 MG tablet 1/2 tab twice a day Patient taking differently: Take 20 mg by mouth 2 (two) times daily. 01/28/18   Mack Hook, MD  Blood Glucose Monitoring Suppl [facility] PRESTO) w/Device KIT Check sugars twice daily before meals 11/19/17   Mack Hook, MD  dapagliflozin propanediol (FARXIGA) 10 MG TABS tablet Take 10 mg by mouth daily. 04/08/20   [provider]  desvenlafaxine (PRISTIQ) 100 MG 24 hr tablet Take 100 mg by mouth daily. 02/20/20   [provider]  Dulaglutide (TRULICITY) 2.08 YE/2.3VK SOPN Inject 0.75 mg into the skin once a week. Patient taking differently: Inject 0.75 mg into the skin once a week. Friday 03/21/21   Renato Shin, MD  DULoxetine (CYMBALTA) 60 MG capsule Take 60 mg by mouth 2 (two) times daily.  01/04/19   [provider]  FLUoxetine (PROZAC) 40 MG capsule Take 40 mg by mouth every morning. 03/26/21    [provider]  fluticasone (FLONASE) 50 MCG/ACT nasal spray Place 2 sprays into both nostrils daily as needed for allergies or rhinitis.  01/09/19   [provider]  gabapentin (NEURONTIN) 300 MG capsule 1 cap by mouth in morning and midday, 2 caps by mouth at bedtime Patient taking differently: Take 300-600 mg by mouth See admin instructions. Take one capsule (300 mg) by mouth in the morning and afternoon, take two capsules (600 mg) at bedtime 11/19/17   Mack Hook, MD  glipiZIDE (GLUCOTROL) 5 MG tablet 1/2 tab by mouth twice daily with meal Patient taking differently: Take 5 mg by mouth daily. With dinner meal 11/19/17   Mack Hook, MD  glucose blood (AGAMATRIX PRESTO TEST) test strip Check blood sugars twice daily before meals 02/02/18   Mack Hook, MD  Insulin NPH, Human,, Isophane, (HUMULIN N KWIKPEN) 100 UNIT/ML Kiwkpen Inject 5 Units into the skin every morning. And pen needles 1/day 05/27/21   Renato Shin, MD  Insulin Pen Needle (PEN NEEDLES) 30G X 5 MM MISC check blood sugar 4 times  a day 06/02/21   Renato Shin, MD  lamoTRIgine (LAMICTAL) 100 MG tablet Take 100 mg by mouth 2 (two) times daily. 04/07/21   [provider]  lisinopril (ZESTRIL) 20 MG tablet Take 1 tablet (20 mg total) by mouth daily. 04/22/21   Charlynne Cousins, MD  metoCLOPramide (REGLAN) 5 MG tablet Take 1 tablet (5 mg total) by mouth every 8 (eight) hours as needed for nausea or vomiting. 06/04/21   Doran Stabler, MD  metoprolol tartrate (LOPRESSOR) 25 MG tablet Take 25 mg by mouth 2 (two) times daily. 02/21/21   [provider]  ondansetron (ZOFRAN) 8 MG tablet Take 8 mg by mouth every 8 (eight) hours as needed for nausea or vomiting. 12/01/19   [provider]  pantoprazole (PROTONIX) 40 MG tablet Take 1 tablet (40 mg total) by mouth 2 (two) times daily. 04/23/21   Daleen Bo, MD    Physical Exam: Vitals:   09/02/21 0530 09/02/21 0600 09/02/21  0630 09/02/21 0645  BP: (!) 152/90 138/85 136/83 (!) 144/90  Pulse: (!) 104 91 89 91  Resp: '12 14 13 14  ' Temp:      TempSrc:      SpO2: 98% 99% 95% 96%    Constitutional: Patient is extremely lethargic but arousable and oriented x3.  Patient is not in associated distress.   Skin: no rashes, no lesions, extremely poor skin turgor noted. Eyes: Pupils are equally reactive to light.  No evidence of scleral icterus or conjunctival pallor.  ENMT: Dry mucous membranes noted.  Posterior pharynx clear of any exudate or lesions.   Neck: normal, supple, no masses, no thyromegaly.  No evidence of jugular venous distension.   Respiratory: clear to auscultation bilaterally, no wheezing, no crackles. Normal respiratory effort. No accessory muscle use.  Cardiovascular: Regular rate and rhythm, no murmurs / rubs / gallops. No extremity edema. 2+ pedal pulses. No carotid bruits.  Chest:   Nontender without crepitus or deformity.   Back:   Nontender without crepitus or deformity. Abdomen: Notable left-sided abdominal tenderness.  Abdomen soft however.   No evidence of intra-abdominal masses.  Positive bowel sounds noted in all quadrants.   Musculoskeletal: Evidence of right BKA.  No other deformities of the lower extremities noted.  Good ROM, no contractures.  Poor muscle tone.  Neurologic: CN 2-12 grossly intact. Sensation intact.  Patient moving all 4 extremities spontaneously.  Patient is following all commands.  Patient is responsive to verbal stimuli.   Psychiatric: Patient exhibits depressed mood with flat affect.  Patient seems to possess insight as to their current situation.     Labs on Admission: I have personally reviewed following labs and imaging studies -   CBC: Recent Labs  Lab 09/01/21 1956 09/02/21 0753  WBC 12.8* 8.3  NEUTROABS 8.6* 5.5  HGB 11.3* 9.8*  HCT 36.4* 30.8*  MCV 89.0 89.8  PLT 238 194   Basic Metabolic Panel: Recent Labs  Lab 09/01/21 1956 09/02/21 0753  NA 137  136  K 3.4* 3.3*  CL 94* 99  CO2 30 26  GLUCOSE 61* 163*  BUN 39* 41*  CREATININE 3.61* 3.68*  CALCIUM 8.1* 7.6*  MG  --  1.7   GFR: CrCl cannot be calculated (Unknown ideal weight.). Liver Function Tests: Recent Labs  Lab 09/01/21 1956 09/02/21 0753  AST 13* 11*  ALT 11 9  ALKPHOS 112 101  BILITOT 0.7 0.8  PROT 6.5 5.5*  ALBUMIN 3.0* 2.5*   Recent Labs  Lab 09/01/21 1956  LIPASE 51   No results for input(s): AMMONIA in the last 168 hours. Coagulation Profile: Recent Labs  Lab 09/01/21 1956  INR 0.9   Cardiac Enzymes: No results for input(s): CKTOTAL, CKMB, CKMBINDEX, TROPONINI in the last 168 hours. BNP (last 3 results) No results for input(s): PROBNP in the last 8760 hours. HbA1C: Recent Labs    09/02/21 0753  HGBA1C 8.5*   CBG: Recent Labs  Lab 08/29/21 1047 09/01/21 2338 09/02/21 0338 09/02/21 0806  GLUCAP 420* 142* 81 167*   Lipid Profile: No results for input(s): CHOL, HDL, LDLCALC, TRIG, CHOLHDL, LDLDIRECT in the last 72 hours. Thyroid Function Tests: No results for input(s): TSH, T4TOTAL, FREET4, T3FREE, THYROIDAB in the last 72 hours. Anemia Panel: No results for input(s): VITAMINB12, FOLATE, FERRITIN, TIBC, IRON, RETICCTPCT in the last 72 hours. Urine analysis:    Component Value Date/Time   COLORURINE YELLOW 09/02/2021 0533   APPEARANCEUR HAZY (A) 09/02/2021 0533   LABSPEC 1.007 09/02/2021 0533   PHURINE 5.0 09/02/2021 0533   GLUCOSEU >=500 (A) 09/02/2021 0533   HGBUR SMALL (A) 09/02/2021 0533   BILIRUBINUR NEGATIVE 09/02/2021 0533   KETONESUR NEGATIVE 09/02/2021 0533   PROTEINUR 100 (A) 09/02/2021 0533   NITRITE NEGATIVE 09/02/2021 0533   LEUKOCYTESUR NEGATIVE 09/02/2021 0533    Radiological Exams on Admission - Personally Reviewed: CT ABDOMEN PELVIS WO CONTRAST  Result Date: 09/02/2021 CLINICAL DATA:  Abdominal pain, fever EXAM: CT ABDOMEN AND PELVIS WITHOUT CONTRAST TECHNIQUE: Multidetector CT imaging of the abdomen and  pelvis was performed following the standard protocol without IV contrast. COMPARISON:  None. FINDINGS: Lower chest: The visualized lung bases are clear. The visualized heart and pericardium are unremarkable. There is circumferential wall thickening involving the distal esophagus and paraesophageal inflammatory stranding in keeping with changes of esophagitis, possibly reflux esophagitis. Hepatobiliary: Cholelithiasis without pericholecystic inflammatory change noted. Liver unremarkable. No intra or extrahepatic biliary ductal dilation. Pancreas: Unremarkable Spleen: Unremarkable Adrenals/Urinary Tract: Nodular thickening of the left adrenal gland is unchanged. The right adrenal gland is unremarkable. The kidneys are normal. The bladder is unremarkable. Stomach/Bowel: Stomach is within normal limits. Appendix appears normal. No evidence of bowel wall thickening, distention, or inflammatory changes. No free intraperitoneal gas or fluid. Vascular/Lymphatic: Minimal atherosclerotic calcification within the abdominal aorta. No aortic aneurysm. No pathologic adenopathy within the abdomen and pelvis. Reproductive: Prostate is unremarkable. Other: Tiny bilateral fat containing inguinal hernias. Rectum unremarkable. Musculoskeletal: No acute bone abnormality. No lytic or blastic bone lesion. IMPRESSION: No acute intra-abdominal pathology identified. No definite radiographic explanation for the patient's reported symptoms. Circumferential wall thickening and paraesophageal inflammatory stranding in keeping with esophagitis, possibly reflux esophagitis. This would be better assessed with endoscopy, if indicated. Cholelithiasis. Aortic Atherosclerosis (ICD10-I70.0). Electronically Signed   By: Fidela Salisbury M.D.   On: 09/02/2021 02:54   DG Chest 2 View  Result Date: 09/01/2021 CLINICAL DATA:  Fever, hyperglycemia, fatigue. EXAM: CHEST - 2 VIEW COMPARISON:  None. FINDINGS: Elevation of the left hemidiaphragm. Normal heart  size and pulmonary vascularity. No focal airspace disease or consolidation in the lungs. No blunting of costophrenic angles. No pneumothorax. Mediastinal contours appear intact. Tortuous aorta. Degenerative changes in the spine. IMPRESSION: No active cardiopulmonary disease. Electronically Signed   By: Lucienne Capers M.D.   On: 09/01/2021 20:01    EKG: Personally reviewed.  Rhythm is normal sinus rhythm with heart rate of 90 bpm.  No dynamic ST segment changes appreciated.  Assessment/Plan  * Acute renal failure superimposed  on stage 3b chronic kidney disease ([place]) Patient exhibiting evidence of acute kidney injury secondary to significant prerenal injury with nearly 4 days of inability to tolerate oral intake with frequent bouts of nausea vomiting and occasional diarrhea Creatinine is currently 3.61 an increase compared to baseline of 2.25 Strict input and output monitoring Monitoring renal function and electrolytes with serial chemistries Avoiding nephrotoxic agents including holding patient's metformin and lisinopril No evidence of hydronephrosis or evidence of postobstructive uropathy on CT imaging of the abdomen and pelvis    Hematemesis Patient reports multiple bouts of hematemesis over the course of the past several days Evidence of substantial esophagitis on CT imaging of which the patient likely has friable mucosa that is bleeding with the patient's frequent vomiting Hemoglobin 13 days ago was 12.5, decreased to 11.3 on arrival to the emergency department and now 9.8 CBCs Patient placed on intravenous Protonix every 12 hours Perform serial CBCs every 6 hours Monitoring for hemodynamic instability Discussed case with Azucena Freed with [facility] gastroenterology who has agreed to see the patient in consultation.  Their assistance is appreciated.  Lactic acidosis Patient presenting with substantial lactic acidosis, presumably secondary to severe volume depletion Hydrating patient  aggressively with intravenous isotonic fluids  no obvious evidence of infection Performing serial lactic acid levels to ensure downtrending and resolution  GERD with esophagitis Circumferential thickening of the esophagus concerning for substantial esophagitis with surrounding stranding CT imaging This is presumably secondary to GERD considering the dramatic presentation with multiple reported bouts of hematemesis and concurrent substantial odynophagia and inability to tolerate oral intake and endoscopic work-up is likely indicated. Patient's been placed on intravenous Protonix every 12 Consulting gastroenterology for formal consultation and consideration of endoscopic work-up Will keep patient n.p.o. for now until patient's symptoms improve then will slowly advance diet as tolerated once cleared by GI  Uncontrolled type 2 diabetes mellitus with hyperglycemia, with long-term current use of insulin ([place]) Patient been placed on Accu-Cheks before every meal and nightly with sliding scale insulin Holding home regimen of oral hypoglycemics and scheduled insulin therapy Hemoglobin A1C ordered    Essential hypertension As patient is currently exhibiting normotension over low normal blood pressures holding majority of patient's scheduled antihypertensives for now As patient is hydrated develops rising blood pressure will resume antihypertensives as tolerated\ As needed intravenous antihypertensives for markedly elevated blood pressures     Code Status:  Full code  code status decision has been confirmed with: patient Family Communication: deferred   Status is: Inpatient  Remains inpatient appropriate because:Ongoing diagnostic testing needed not appropriate for outpatient work up, IV treatments appropriate due to intensity of illness or inability to take PO, and Inpatient level of care appropriate due to severity of illness  Dispo: The patient is from: Home              Anticipated d/c is  to: Home              Patient currently is not medically stable to d/c.   Difficult to place patient No        Vernelle Emerald MD [facility] Pager [phone]  If 7PM-7AM, please contact night-coverage [email or web address] Use universal [place] password for that web site. If you do not have the password, please call the hospital operator.  09/02/2021, 9:14 AM

## 2021-09-02 NOTE — Op Note (Signed)
[facility] Patient Name: [name] [name] Procedure Date : 09/02/2021 MRN: [id number] Attending MD: Georgian Co ,  Date of Birth: May 25, 1971 CSN: [id number] Age: 50 Admit Type: Inpatient Procedure:                Upper GI endoscopy Indications:              Hematemesis Providers:                Sonny Masters "Lyndee Leo" Pollyann Glen Person, Fransico Setters                            [place], Technician Referring MD:              Medicines:                Monitored Anesthesia Care Complications:            No immediate complications. Estimated Blood Loss:     Estimated blood loss was minimal. Procedure:                After obtaining informed consent, the endoscope was                            passed under direct vision. Throughout the                            procedure, the patient's blood pressure, pulse, and                            oxygen saturations were monitored continuously. The                            [id number]-[id number] ([id number] Olympus endoscope was introduced                            through the mouth, and advanced to the third part                            of duodenum. Scope In: Scope Out: Findings:      LA Grade D (one or more mucosal breaks involving at least 75% of       esophageal circumference) esophagitis with no bleeding was found in the       distal esophagus. Biopsies were taken with a cold forceps for histology.      A hiatal hernia was present.      Excessive fluid was found in the gastric body.      Striped erythematous mucosa without bleeding was found in the gastric       antrum.      Localized nodular mucosa was found in the duodenal bulb. Impression:               - LA Grade D esophagitis with no bleeding. Biopsied.                           - Hiatal hernia.                           - Excessive gastric fluid.                           -  Erythematous mucosa in the antrum.                           - Nodular mucosa in the duodenal  bulb. Recommendation:           - Return patient to hospital ward for ongoing care.                           - It is suspected that the patient had hematemesis                            due to esophagitis.                           - Use a proton pump inhibitor PO BID for 8 weeks.                           - Use sucralfate suspension 1 gram PO QID for 4                            weeks.                           - Repeat upper endoscopy in 8 weeks to check                            healing.                           - Await pathology results.                           - The findings and recommendations were discussed                            with the patient and/or patient's family. Procedure Code(s):        --- Professional ---                           [phone], Esophagogastroduodenoscopy, flexible,                            transoral; with biopsy, single or multiple Diagnosis Code(s):        --- Professional ---                           K20.90, Esophagitis, unspecified without bleeding                           K44.9, Diaphragmatic hernia without obstruction or                            gangrene                           K31.89, Other diseases of stomach and duodenum  K92.0, Hematemesis CPT copyright 2019 [employer]. All rights reserved. The codes documented in this report are preliminary and upon coder review may  be revised to meet current compliance requirements. Sonny Masters "Christia Reading,  09/02/2021 3:06:07 PM Number of Addenda: 0

## 2021-09-02 NOTE — Progress Notes (Signed)
ELIZJAH, NOBLET ([id number]) Visit Report for 09/01/2021 Chief Complaint Document Details Patient Name: Date of Service: [name] [name] 09/01/2021 4:00 PM Medical Record Number: [id number] Patient Account Number: [id number] Date of Birth/Sex: Treating RN: 01/03/1971 (50 y.o. [name] Primary Care Provider: [name] Other Clinician: Referring Provider: Treating Provider/Extender: Delano Metz in Treatment: 93 Information Obtained from: Patient Chief Complaint patient is here for a review of the wound on his plantar left first metatarsal head and back of his right leg Electronic Signature(s) Signed: 09/02/2021 12:51:36 PM By: Kalman Shan DO Entered By: Kalman Shan on 09/02/2021 12:46:48 -------------------------------------------------------------------------------- Cellular or Tissue Based Product Details Patient Name: Date of Service: [name] [name], [name] 09/01/2021 4:00 PM Medical Record Number: [id number] Patient Account Number: [id number] Date of Birth/Sex: Treating RN: [birth year] (50 y.o. [name] Primary Care Provider: [name] Other Clinician: Referring Provider: Treating Provider/Extender: Delano Metz in Treatment: 47 Cellular or Tissue Based Product Type Wound #2R Left Metatarsal head first Applied to: Performed By: Physician Kalman Shan, DO Cellular or Tissue Based Product Type: Apligraf Level of Consciousness (Pre-procedure): Awake and Alert Pre-procedure Verification/Time Out Yes - 16:17 Taken: Location: genitalia / hands / feet / multiple digits Wound Size (sq cm): 0.2 Product Size (sq cm): 44 Waste Size (sq cm): 39.6 Waste Reason: Wound and Product Size Amount of Product Applied (sq cm): 4.4 Instrument Used: Blade, Forceps Lot #: [id number] Expiration Date: 09/05/2021 Fenestrated: Yes Instrument: Blade Reconstituted: Yes Solution Type:  Normal Saline Solution Amount: 84ml Lot #: [id number] Solution Expiration Date: 08/23/2022 Secured: Yes Secured With: Steri-Strips Dressing Applied: Yes Primary Dressing: Adaptic Level of Consciousness (Post- Awake and Alert procedure): Post Procedure Diagnosis Same as Pre-procedure Electronic Signature(s) Signed: 09/01/2021 5:11:49 PM By: Lorrin Jackson Signed: 09/02/2021 12:51:36 PM By: Kalman Shan DO Entered By: Lorrin Jackson on 09/01/2021 16:33:54 -------------------------------------------------------------------------------- Debridement Details Patient Name: Date of Service: [name] [name], [name] 09/01/2021 4:00 PM Medical Record Number: [id number] Patient Account Number: [id number] Date of Birth/Sex: Treating RN: [birth year] (50 y.o. [name] Primary Care Provider: [name] Other Clinician: Referring Provider: Treating Provider/Extender: Delano Metz in Treatment: 72 Debridement Performed for Assessment: Wound #2R Left Metatarsal head first Performed By: Physician Kalman Shan, DO Debridement Type: Debridement Severity of Tissue Pre Debridement: Fat layer exposed Level of Consciousness (Pre-procedure): Awake and Alert Pre-procedure Verification/Time Out Yes - 16:17 Taken: Start Time: 16:18 T Area Debrided (L x W): otal 0.5 (cm) x 0.4 (cm) = 0.2 (cm) Tissue and other material debrided: Non-Viable, Callus, Subcutaneous Level: Skin/Subcutaneous Tissue Debridement Description: Excisional Instrument: Curette Bleeding: Minimum Hemostasis Achieved: Pressure End Time: 16:23 Response to Treatment: Procedure was tolerated well Level of Consciousness (Post- Awake and Alert procedure): Post Debridement Measurements of Total Wound Length: (cm) 0.5 Width: (cm) 0.4 Depth: (cm) 0.4 Volume: (cm) 0.063 Character of Wound/Ulcer Post Debridement: Stable Severity of Tissue Post Debridement: Fat layer exposed Post Procedure  Diagnosis Same as Pre-procedure Electronic Signature(s) Signed: 09/01/2021 5:11:49 PM By: Lorrin Jackson Signed: 09/02/2021 12:51:36 PM By: Kalman Shan DO Entered By: Lorrin Jackson on 09/01/2021 16:24:06 -------------------------------------------------------------------------------- HPI Details Patient Name: Date of Service: [name] [name], [name] 09/01/2021 4:00 PM Medical Record Number: [id number] Patient Account Number: [id number] Date of Birth/Sex: Treating RN: 1971-09-22 (50 y.o. [name] Primary Care Provider: [name] Other Clinician: Referring Provider: Treating Provider/Extender: Delano Metz in Treatment: 94 History of Present Illness HPI Description: 04/21/18  ADMISSION This is a 50 year old man who is a type II diabetic. In spite of fact his hemoglobin A1c is actually quite good 5.73 months ago he is at a really difficult time over the last 6-7 months. He developed a rapidly progressive infection in the right foot in November 2018 associated with osteomyelitis and necrotizing fasciitis. 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 The stump required and a revision on 11/24/17. The stump revision was advertised as being secondary to falls. I'm not sure the progressive history here however this area is actually closed over. The patient tells me over the same timeframe he has had wounds on the plantar aspect of his left foot. In the ED saw Dr. Sharol Given in April of this year. 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 It is really not clear that this patient is been dressing this with anything. He came in with the clinic without any specific dressing on the wound areas using his own tennis shoe. He tells me that he only ambulates of course to do a pivot transfer. He does not have his prosthesis for the right leg as of yet and he blames Medicaid for this. He does however use the foot to push himself  along in his wheelchair at home. The patient has not had formal arterial studies. ABI in our clinic on the left was 1.13. Patient's past medical history includes type 2 diabetes, fracture of the right fibula. I see that he was treated for abscesses on his right buttock and chest in 2016. I did not look at the microbiology of this. 04/28/18; patient comes back in the clinic today with the wound pretty much the same as when he came in here last week. Small opening lots of undermining relatively. He tells [place] that nothing is really been on this for 3 days in spite of the fact that we gave him enough to dress this easily especially such a small wound. He says he lives with his mother, she is not capable of assisting with this he is changing the dressings himself. 05/05/18; much better-looking wound today. Smaller. There is some undermining medially however that's only perhaps 2 mm. No surrounding erythema. We've been using silver collagen 05/12/18; small wound on the first metatarsal head. No undermining no surrounding erythema. We've been using silver collagen he has home health coming out to change 05/20/18 the wound on the first metatarsal head looks better. Covered in surface debris/callus nonviable tissue. Required debridement but post debridement this looks quite good. We've been using silver collagen. He has home health 05/27/18; first metatarsal head wound continues to improve. Just about completely closed. Still a lot of surface debridement callus. We've been using silver collagen 06/06/18; the first metatarsal wound is completely healed over. There is still a lot of callus. I gently removed some of this just to make sure that there was no open area and there is not. The patient mentions to me that his left leg has felt like "lead" for about a week READMISSION 08/16/2020 This is a 50 year old man with type 2 diabetes. He returns to clinic today with a 1 month history of a blister and callus over the  first metatarsal head. This is in exactly the same area as when he was here in 2019. 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 He has standard running shoes on the left foot to match  the area in his prosthesis. He has only been covering this area with a Band-Aid has not really been specifically dressing this. ABI in our clinic on the left was 1.16. This is essentially stable from the value in 2019 10/1 the patient has a linear wound over the left first metatarsal head. Again not a lot of callus thick skin around this that I removed with a #3 curette. This is not go deep to bone or muscle it does not look to be infected. 10/8 first metatarsal head. The wound looks like it is closing however this is going to be a difficult area to offload in the future. He has a size 15 shoe and he says his shoes are years old. The inserts in these current shoes are totally useless and I have advised him to replace these. He may need a new pair of shoes and I have he got original prosthesis at [facility] [place] I have asked him to go back there. 11/9; the patient has not been here in more than a month. Not sure he was healed when he was here the last time. I told him he would have to get new shoes and certainly offload the area over the left first metatarsal head. He has done nothing of the kind. He arrives back in clinic with the same old new balance sneakers. A very large separating callus over the first metatarsal head on the left 11/16; he has purchased new shoes and has at least 2 insoles like I asked. The wound is measuring much smaller. He is using silver alginate he changes the dressing himself 11/30; wound is measurably smaller in length of about a half a centimeter. This is generally very little depth. We have been using silver alginate. He changes the dressing himself every second day. 12/14; wound is about the same size. Significant undermining medially  relative to the size of the wound. We have been using silver alginate. There is not a way to offload this area as he walks with a prosthesis on the right leg 12/28; wound is about the same size. Again he has undermining medially. I am using polymen on this 12/02/2020; the wound looks smaller. Still a senescent edge. We have been using polymen 1/24; the wound is come down a few millimeters. Still a senescent edge. I have been using polymen 2/8; the wound is come down slightly. Still with slight undermining from 12-6 o'clock I have been using polymen partially to get offloading on this area which is opposed by his prosthesis on the other leg. 2/22 quite open improvement he still has a comma shaped wound on the left first metatarsal head. Some depth. Using polymen 3/14; he still has the same problem of a comma shaped area over the left first metatarsal head. This seems to have had more depth today at 0.6 cm. We have been using polymen. The patient changes this himself every second day. I explored the idea of a total contact cast on the left leg however this is his driving foot. He was not enamored with the idea of not being able to drive and states that he does not have anybody else to get him around 3/28; again he comes in with a smaller looking wound however there is depth and undermining requiring debridement. We have been using Hydrofera Blue, changed to silver collagen today. The patient is doing the dressing himself 4/11; patient presents today for follow-up of his left diabetic foot wound. He denies any issues  in the past 2 weeks. He is currently using silver collagen every other day with dressing changes. He does not use diabetic shoes or special inserts to his sneakers. He does not use felt pads for offloading. 4/19; patient presents for his 1 week follow-up. He denies any issues. He reports using Hydrofera Blue and has not been using silver collagen for dressing changes. He reports minimal  drainage to the wound. He overall feels well. 5/3; patient presents for 2-week follow-up. He has been using silver collagen without issues. He has no complaints today and states he overall feels well. 5/17; patient presents for 2-week follow-up. He states that the sleeve is rubbing up against the back of his right leg and causing a wound. He states this happened a week ago and has not been dressing the wound. The plantar wound is stable and he has been using collagen every other day. 5/24; Patient was had a recent hospitalization for cellulitis of his right popliteal fossa. He was admitted and started on IV antibiotics. He does not recall the plantar wound dressed while inpatient. He feels a lot better today. He was discharged on 5/21 on oral antibiotics and has not picked this up until today. He reports using collagen to the plantar wound since discharge. He is keeping the right posterior knee wound covered. He is getting a new sleeve for his prostatic on 5/26. He currently denies signs of infection. 6/1; patient presents for 1 week follow-up. He is still on doxycycline. He has not been dressing the back of the right knee wound. He states he is receiving his new prosthetic sleeve tomorrow. He states he is using silver alginate to the plantar foot wound. He currently denies signs of infection. He states he has not been offloading the left foot wound 6/7; patient presents for 1 week follow-up. He completes his last dose of doxycycline today. He finally received his prosthetic sleeve for the right side and he states it feels much better. He has been using antibiotic ointment to the back of that right knee wound. He has been using silver alginate to the plantar foot wound. He currently denies signs of infection. 6/21; patient presents for 2-week follow-up. He reports no issues with his prosthetic sleeve. He has been using antibiotic ointment to the back of the right knee. He has been using silver  alginate to the plantar foot wound. He has no issues or complaints today. He denies signs of infection. 7/5; patient presents for 2-week follow-up. He reports no issues or complaints today. He continues to use antibiotic ointment to the right posterior knee wound and silver alginate to the left plantar foot wound. He denies signs of infection. 7/19; patient presents for 2-week follow-up. He continues to use antibiotic ointment to the right posterior knee and silver alginate to the left plantar foot wound. He has no issues or complaints today. He denies signs of infection. 8/4; patient presents for 2-week follow-up. He has no issues or complaints today. He denies signs of infection. 8/18; patient presents for 2-week follow-up. He has no issues or concerns today. He has been approved for Apligraf and he would like to have this placed today. He denies signs of infection and reports that his right posterior knee wound is closed. 8/25; patient presents for 1 week follow-up. He had no issues with his first Apligraf placement last week. He did however develop an abscess to the back of his right knee. He reports tenderness to this area. He denies systemic signs  of infection. 9/1; patient presents for 1 week follow-up. He has had no issues with his plantar foot wound. The Apligraf has stayed in place over the past week. A wound culture was done at last clinic visit and a new antibiotic was sent to the pharmacy. Patient has not received this yet. He reports some mild tenderness to the back of his right knee. He denies systemic signs of infection. 9/8; patient presents for 1 week follow-up. He states he took Keflex and had diarrhea as a side effect. He was able to complete the course. He reports continued tenderness to the right posterior knee wound. He has some drainage still. He denies increased warmth or erythema to the area. He denies fever/chills or nausea/vomiting. He has no issues or complaints today  about his plantar foot wound. 9/15; patient presents for follow-up. He reports improvement in pain to the back of his right leg. He does not report any drainage. He had an ultrasound completed on 9/9 that showed a superficial fluid collection concerning for abscess. We attempted to call patient with results with no answer. We did put an urgent referral to general surgery and he reports he has not received a call. He currently denies systemic signs of infection. He reports no issues to his left plantar foot wound. 9/27; patient presents for follow-up. He has no issues or complaints today. He has had no issues with the previous wound to the back of his right leg. He has not made an appointment to see general surgery. He had Apligraf placed at last clinic visit to the plantar foot wound. He currently denies signs of infection. 10/7; patient presents for follow-up. Upon entering the room there is vomit throughout the entire floor. Patient states he does not feel well. I recommended he go to urgent care. I recommended following up next week for wound care. 10/10; patient presents for follow-up. He came last week for his follow-up however he was ill and started vomiting in the exam room. Today he states he feels better however he continues to have vomiting episodes. He states he attempted to go to urgent care/the ED but could not find a parking spot. He subsequently went home. He has no issues or complaints today regarding the foot wound. He never followed up with general surgery for the abscess identified on ultrasound to the back of his right knee. Electronic Signature(s) Signed: 09/02/2021 12:51:36 PM By: Kalman Shan DO Entered By: Kalman Shan on 09/02/2021 12:47:48 -------------------------------------------------------------------------------- Physical Exam Details Patient Name: Date of Service: [name] [name], [name] 09/01/2021 4:00 PM Medical Record Number: [id number] Patient Account  Number: [id number] Date of Birth/Sex: Treating RN: August 18, 1971 (50 y.o. [name] Primary Care Provider: [name] Other Clinician: Referring Provider: Treating Provider/Extender: Delano Metz in Treatment: 110 Constitutional respirations regular, non-labored and within target range for patient.Marland Kitchen Psychiatric pleasant and cooperative. Notes Left foot wound with significant callus surrounding open wound with granulation tissue present and nonviable tissue. The callus this is lifting up with the markings of a potential new wound. No surrounding signs of infection. Electronic Signature(s) Signed: 09/02/2021 12:51:36 PM By: Kalman Shan DO Entered By: Kalman Shan on 09/02/2021 12:49:22 -------------------------------------------------------------------------------- Physician Orders Details Patient Name: Date of Service: [name] [name], [name] 09/01/2021 4:00 PM Medical Record Number: [id number] Patient Account Number: [id number] Date of Birth/Sex: Treating RN: 1971-08-18 (50 y.o. 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 - with Dr. Heber [place] (Thurs/Fri) Return A Other: - Patient given number to call General Surgery re: right posterior leg Cellular or Tissue Based Products Cellular or Tissue Based Product Type: - Apligraf #7 09/01/21 Edema Control - Lymphedema / SCD /  Other Elevate legs to the level of the heart or above for 30 minutes daily and/or when sitting, a frequency of: Avoid standing for long periods of time. Wound Treatment Wound #2R - Metatarsal head first Wound Laterality: Left Cleanser: Soap and Water 1 x Per Week/15 Days Discharge Instructions: May shower and wash wound with dial antibacterial soap and water prior to dressing change. Cleanser: Wound Cleanser (Generic) 1 x Per Week/15 Days Discharge Instructions: Cleanse the wound with wound cleanser prior to applying a clean dressing using gauze sponges, not tissue or cotton balls. Peri-Wound Care: Sween Lotion (Moisturizing lotion) 1 x Per Week/15 Days Discharge Instructions: Apply moisturizing lotion to leg Secondary Dressing: Woven Gauze Sponges 2x2 in (Generic) 1 x Per Week/15 Days Discharge Instructions: Apply over primary dressing as directed. Secondary Dressing: Optifoam Non-Adhesive Dressing, 4x4 in (Generic) 1 x Per Week/15 Days Discharge Instructions: cut to make foam donut to help offload Secondary Dressing: ADAPTIC TOUCH 3x4.25 in 1 x Per Week/15 Days Discharge Instructions: Apply over primary dressing as directed. Secured With: Child psychotherapist, Sterile 2x75 (in/in) (Generic) 1 x Per Week/15 Days Discharge Instructions: Secure with stretch gauze as directed. Secured With: 36M Medipore H Soft Cloth Surgical Tape, 2x2 (in/yd) (Generic) 1 x Per Week/15 Days Discharge Instructions: Secure dressing with tape as directed. Electronic Signature(s) Signed: 09/02/2021 12:51:36 PM By: Kalman Shan DO Previous Signature: 09/01/2021 5:11:49 PM Version By: Lorrin Jackson Entered By: Kalman Shan on 09/02/2021 12:49:37 -------------------------------------------------------------------------------- Problem List Details Patient Name: Date of Service: [name] [name], [place] 09/01/2021 4:00 PM Medical Record Number: [id number] Patient Account Number: [id number] Date of  Birth/Sex: Treating RN: June 30, 1971 (50 y.o. [name] Primary Care Provider: [name] Other Clinician: Referring Provider: Treating Provider/Extender: Delano Metz in Treatment: 59 Active Problems ICD-10 Encounter Code Description Active Date MDM Diagnosis [phone] Non-pressure chronic ulcer of other part of left foot with fat layer exposed 08/16/2020 No Yes E11.621 Type 2 diabetes mellitus with foot ulcer 08/16/2020 No Yes S81.801D Unspecified open wound, right lower leg, subsequent encounter 04/08/2021 No Yes E11.42 Type 2 diabetes mellitus with diabetic polyneuropathy 08/16/2020 No Yes Z89.511 Acquired absence of right leg below knee 08/16/2020 No Yes Inactive Problems Resolved Problems Electronic Signature(s) Signed: 09/02/2021 12:51:36 PM By: Kalman Shan DO Previous Signature: 09/01/2021 5:11:49 PM Version By: Lorrin Jackson Entered By: Kalman Shan on 09/02/2021 12:46:34 -------------------------------------------------------------------------------- Progress Note Details Patient Name: Date of Service: [name] [name], [name] 09/01/2021 4:00 PM Medical Record Number: [id number] Patient Account Number: [id number] Date of Birth/Sex: Treating RN: 1971-04-27 (50 y.o. [name] Primary Care Provider: [name] Other Clinician: Referring Provider: Treating Provider/Extender: Delano Metz in Treatment: 60 Subjective Chief Complaint Information obtained from Patient patient is here for a review of the wound on his plantar left first metatarsal head and back of his  right leg History of Present Illness (HPI) 04/21/18 ADMISSION This is a 50 year old man who is a type II diabetic. In spite of fact his hemoglobin A1c is actually quite good 5.73 months ago he is at a really difficult time over the last 6-7 months. He developed a rapidly progressive infection in the right foot in November 2018 associated  with osteomyelitis and necrotizing fasciitis. 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 The stump required and a revision on 11/24/17. The stump revision was advertised as being secondary to falls. I'm not sure the progressive history here however this area is actually closed over. The patient tells me over the same timeframe he has had wounds on the plantar aspect of his left foot. In the ED saw Dr. Sharol Given in April of this year. 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 It is really not clear that this patient is been dressing this with anything. He came in with the clinic without any specific dressing on the wound areas using his own tennis shoe. He tells me that he only ambulates of course to do a pivot transfer. He does not have his prosthesis for the right leg as of yet and he blames Medicaid for this. He does however use the foot to push himself along in his wheelchair at home. The patient has not had formal arterial studies. ABI in our clinic on the left was 1.13. Patient's past medical history includes type 2 diabetes, fracture of the right fibula. I see that he was treated for abscesses on his right buttock and chest in 2016. I did not look at the microbiology of this. 04/28/18; patient comes back in the clinic today with the wound pretty much the same as when he came in here last week. Small opening lots of undermining relatively. He tells [place] that nothing is really been on this for 3 days in spite of the fact that we gave him enough to dress this easily especially such a small wound. He says he lives with his mother, she is not capable of assisting with this he is changing the dressings himself. 05/05/18; much better-looking wound today. Smaller. There is some undermining medially however that's only perhaps 2 mm. No surrounding erythema. We've been using silver collagen 05/12/18; small wound on the first metatarsal head. No undermining no surrounding  erythema. We've been using silver collagen he has home health coming out to change 05/20/18 the wound on the first metatarsal head looks better. Covered in surface debris/callus nonviable tissue. Required debridement but post debridement this looks quite good. We've been using silver collagen. He has home health 05/27/18; first metatarsal head wound continues to improve. Just about completely closed. Still a lot of surface debridement callus. We've been using silver collagen 06/06/18; the first metatarsal wound is completely healed over. There is still a lot of callus. I gently removed some of this just to make sure that there was no open area and there is not. The patient mentions to me that his left leg has felt like "lead" for about a week READMISSION 08/16/2020 This is a 50 year old man with type 2 diabetes. He returns to clinic today with a 1 month history of a blister and callus over the first metatarsal head. This is in exactly the same area as when he was here in 2019. 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 He has standard  running shoes on the left foot to match the area in his prosthesis. He has only been covering this area with a Band-Aid has not really been specifically dressing this. ABI in our clinic on the left was 1.16. This is essentially stable from the value in 2019 10/1 the patient has a linear wound over the left first metatarsal head. Again not a lot of callus thick skin around this that I removed with a #3 curette. This is not go deep to bone or muscle it does not look to be infected. 10/8 first metatarsal head. The wound looks like it is closing however this is going to be a difficult area to offload in the future. He has a size 15 shoe and he says his shoes are years old. The inserts in these current shoes are totally useless and I have advised him to replace these. He may need a new pair of shoes and I have he got original  prosthesis at [facility] on the right I have asked him to go back there. 11/9; the patient has not been here in more than a month. Not sure he was healed when he was here the last time. I told him he would have to get new shoes and certainly offload the area over the left first metatarsal head. He has done nothing of the kind. He arrives back in clinic with the same old new balance sneakers. A very large separating callus over the first metatarsal head on the left 11/16; he has purchased new shoes and has at least 2 insoles like I asked. The wound is measuring much smaller. He is using silver alginate he changes the dressing himself 11/30; wound is measurably smaller in length of about a half a centimeter. This is generally very little depth. We have been using silver alginate. He changes the dressing himself every second day. 12/14; wound is about the same size. Significant undermining medially relative to the size of the wound. We have been using silver alginate. There is not a way to offload this area as he walks with a prosthesis on the right leg 12/28; wound is about the same size. Again he has undermining medially. I am using polymen on this 12/02/2020; the wound looks smaller. Still a senescent edge. We have been using polymen 1/24; the wound is come down a few millimeters. Still a senescent edge. I have been using polymen 2/8; the wound is come down slightly. Still with slight undermining from 12-6 o'clock I have been using polymen partially to get offloading on this area which is opposed by his prosthesis on the other leg. 2/22 quite open improvement he still has a comma shaped wound on the left first metatarsal head. Some depth. Using polymen 3/14; he still has the same problem of a comma shaped area over the left first metatarsal head. This seems to have had more depth today at 0.6 cm. We have been using polymen. The patient changes this himself every second day. I explored the idea of a  total contact cast on the left leg however this is his driving foot. He was not enamored with the idea of not being able to drive and states that he does not have anybody else to get him around 3/28; again he comes in with a smaller looking wound however there is depth and undermining requiring debridement. We have been using Hydrofera Blue, changed to silver collagen today. The patient is doing the dressing himself 4/11; patient presents today for follow-up of  his left diabetic foot wound. He denies any issues in the past 2 weeks. He is currently using silver collagen every other day with dressing changes. He does not use diabetic shoes or special inserts to his sneakers. He does not use felt pads for offloading. 4/19; patient presents for his 1 week follow-up. He denies any issues. He reports using Hydrofera Blue and has not been using silver collagen for dressing changes. He reports minimal drainage to the wound. He overall feels well. 5/3; patient presents for 2-week follow-up. He has been using silver collagen without issues. He has no complaints today and states he overall feels well. 5/17; patient presents for 2-week follow-up. He states that the sleeve is rubbing up against the back of his right leg and causing a wound. He states this happened a week ago and has not been dressing the wound. The plantar wound is stable and he has been using collagen every other day. 5/24; Patient was had a recent hospitalization for cellulitis of his right popliteal fossa. He was admitted and started on IV antibiotics. He does not recall the plantar wound dressed while inpatient. He feels a lot better today. He was discharged on 5/21 on oral antibiotics and has not picked this up until today. He reports using collagen to the plantar wound since discharge. He is keeping the right posterior knee wound covered. He is getting a new sleeve for his prostatic on 5/26. He currently denies signs of infection. 6/1;  patient presents for 1 week follow-up. He is still on doxycycline. He has not been dressing the back of the right knee wound. He states he is receiving his new prosthetic sleeve tomorrow. He states he is using silver alginate to the plantar foot wound. He currently denies signs of infection. He states he has not been offloading the left foot wound 6/7; patient presents for 1 week follow-up. He completes his last dose of doxycycline today. He finally received his prosthetic sleeve for the right side and he states it feels much better. He has been using antibiotic ointment to the back of that right knee wound. He has been using silver alginate to the plantar foot wound. He currently denies signs of infection. 6/21; patient presents for 2-week follow-up. He reports no issues with his prosthetic sleeve. He has been using antibiotic ointment to the back of the right knee. He has been using silver alginate to the plantar foot wound. He has no issues or complaints today. He denies signs of infection. 7/5; patient presents for 2-week follow-up. He reports no issues or complaints today. He continues to use antibiotic ointment to the right posterior knee wound and silver alginate to the left plantar foot wound. He denies signs of infection. 7/19; patient presents for 2-week follow-up. He continues to use antibiotic ointment to the right posterior knee and silver alginate to the left plantar foot wound. He has no issues or complaints today. He denies signs of infection. 8/4; patient presents for 2-week follow-up. He has no issues or complaints today. He denies signs of infection. 8/18; patient presents for 2-week follow-up. He has no issues or concerns today. He has been approved for Apligraf and he would like to have this placed today. He denies signs of infection and reports that his right posterior knee wound is closed. 8/25; patient presents for 1 week follow-up. He had no issues with his first Apligraf  placement last week. He did however develop an abscess to the back of his right knee. He  reports tenderness to this area. He denies systemic signs of infection. 9/1; patient presents for 1 week follow-up. He has had no issues with his plantar foot wound. The Apligraf has stayed in place over the past week. A wound culture was done at last clinic visit and a new antibiotic was sent to the pharmacy. Patient has not received this yet. He reports some mild tenderness to the back of his right knee. He denies systemic signs of infection. 9/8; patient presents for 1 week follow-up. He states he took Keflex and had diarrhea as a side effect. He was able to complete the course. He reports continued tenderness to the right posterior knee wound. He has some drainage still. He denies increased warmth or erythema to the area. He denies fever/chills or nausea/vomiting. He has no issues or complaints today about his plantar foot wound. 9/15; patient presents for follow-up. He reports improvement in pain to the back of his right leg. He does not report any drainage. He had an ultrasound completed on 9/9 that showed a superficial fluid collection concerning for abscess. We attempted to call patient with results with no answer. We did put an urgent referral to general surgery and he reports he has not received a call. He currently denies systemic signs of infection. He reports no issues to his left plantar foot wound. 9/27; patient presents for follow-up. He has no issues or complaints today. He has had no issues with the previous wound to the back of his right leg. He has not made an appointment to see general surgery. He had Apligraf placed at last clinic visit to the plantar foot wound. He currently denies signs of infection. 10/7; patient presents for follow-up. Upon entering the room there is vomit throughout the entire floor. Patient states he does not feel well. I recommended he go to urgent care. I recommended  following up next week for wound care. 10/10; patient presents for follow-up. He came last week for his follow-up however he was ill and started vomiting in the exam room. Today he states he feels better however he continues to have vomiting episodes. He states he attempted to go to urgent care/the ED but could not find a parking spot. He subsequently went home. He has no issues or complaints today regarding the foot wound. He never followed up with general surgery for the abscess identified on ultrasound to the back of his right knee. Patient History Information obtained from Patient. Family History Diabetes - Maternal Grandparents,Mother, Heart Disease - Father, Hypertension - Father, No family history of Cancer, Hereditary Spherocytosis, Kidney Disease, Lung Disease, Seizures, Stroke, Thyroid Problems, Tuberculosis. Social History Current every day smoker, Marital Status - Single, Alcohol Use - Never, Drug Use - No History, Caffeine Use - Never. Medical History Eyes Denies history of Cataracts, Glaucoma, Optic Neuritis Ear/Nose/Mouth/Throat Denies history of Chronic sinus problems/congestion, Middle ear problems Hematologic/Lymphatic Patient has history of Anemia Denies history of Hemophilia, Human Immunodeficiency Virus, Lymphedema, Sickle Cell Disease Respiratory Denies history of Aspiration, Asthma, Chronic Obstructive Pulmonary Disease (COPD), Pneumothorax, Sleep Apnea, Tuberculosis Cardiovascular Patient has history of Hypertension, Peripheral Venous Disease Denies history of Angina, Arrhythmia, Congestive Heart Failure, Coronary Artery Disease, Deep Vein Thrombosis, Hypotension, Myocardial Infarction, Peripheral Arterial Disease, Phlebitis, Vasculitis Gastrointestinal Denies history of Cirrhosis , Colitis, Crohnoos, Hepatitis A, Hepatitis B, Hepatitis C Endocrine Patient has history of Type II Diabetes - oral meds Denies history of Type I Diabetes Genitourinary Denies  history of End Stage Renal Disease Immunological Denies history  of Lupus Erythematosus, Raynaudoos, Scleroderma Integumentary (Skin) Denies history of History of Burn Musculoskeletal Denies history of Gout, Rheumatoid Arthritis, Osteoarthritis, Osteomyelitis Neurologic Denies history of Dementia, Neuropathy, Quadriplegia, Paraplegia, Seizure Disorder Oncologic Denies history of Received Chemotherapy, Received Radiation Psychiatric Denies history of Anorexia/bulimia, Confinement Anxiety Hospitalization/Surgery History - fever 105, sepsis cellulitis right popliteal fossa 04/09/2021. Medical A Surgical History Notes [name] Constitutional Symptoms (General Health) falls , leukocytosis Respiratory During waking hours and catches himself not breathing, "gasp for air". Saw Dr Wynonia Lawman, he couldn't find a reason Objective Constitutional respirations regular, non-labored and within target range for patient.. Vitals Time Taken: 3:57 PM, Height: 77 in, Source: Stated, Weight: 232 lbs, Source: Stated, BMI: 27.5, Temperature: 99.2 F, Pulse: 112 bpm, Respiratory Rate: 18 breaths/min, Blood Pressure: 108/77 mmHg. General Notes: has not checked his blood sugar since Friday. Pt states vomited again Sunday and had fever of 101 yesterday Psychiatric pleasant and cooperative. General Notes: Left foot wound with significant callus surrounding open wound with granulation tissue present and nonviable tissue. The callus this is lifting up with the markings of a potential new wound. No surrounding signs of infection. Integumentary (Hair, Skin) Wound #2R status is Open. Original cause of wound was Blister. The date acquired was: 06/23/2020. The wound has been in treatment 54 weeks. The wound is located on the Left Metatarsal head first. The wound measures 0.5cm length x 0.4cm width x 0.4cm depth; 0.157cm^2 area and 0.063cm^3 volume. There is Fat Layer (Subcutaneous Tissue) exposed. There is no tunneling noted,  however, there is undermining starting at 12:00 and ending at 12:00 with a maximum distance of 0.7cm. There is a small amount of serosanguineous drainage noted. The wound margin is well defined and not attached to the wound base. There is large (67-100%) red granulation within the wound bed. There is no necrotic tissue within the wound bed. 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 The callus appears to be lifting up I will keep this in place because it looks like a new wound will be created if this happens.No signs of infection on exam. Apligraf #7 was placed in standard fashion. I recommended aggressive offloading. I recommended that the patient follow-up with his primary care provider for his ongoing GI symptoms. We also gave him the number to general surgery to call and make a follow-up appointment for his referral. Procedures Wound #2R Pre-procedure diagnosis of Wound #2R is a Diabetic Wound/Ulcer of the Lower Extremity located on the Left Metatarsal head first .Severity of Tissue Pre Debridement is: Fat layer exposed. There was a Excisional Skin/Subcutaneous Tissue Debridement with a total area of 0.2 sq cm performed by Kalman Shan, DO. With the following instrument(s): Curette to remove Non-Viable tissue/material. Material removed includes Callus and Subcutaneous Tissue and. No specimens were taken. A time out was conducted at 16:17, prior to the start of the procedure. A Minimum amount of bleeding was controlled with Pressure. The procedure was tolerated well. Post Debridement Measurements: 0.5cm length x 0.4cm width x 0.4cm  depth; 0.063cm^3 volume. Character of Wound/Ulcer Post Debridement is stable. Severity of Tissue Post Debridement is: Fat layer exposed. Post procedure Diagnosis Wound #2R: Same as Pre-Procedure Pre-procedure diagnosis of Wound #2R  is a Diabetic Wound/Ulcer of the Lower Extremity located on the Left Metatarsal head first. A skin graft procedure using a bioengineered skin substitute/cellular or tissue based product was performed by Kalman Shan, DO with the following instrument(s): Blade and Forceps. Apligraf was applied and secured with Steri-Strips. 4.4 sq cm of product was utilized and 39.6 sq cm was wasted due to Wound and Product Size. Post Application, Adaptic was applied. A Time Out was conducted at 16:17, prior to the start of the procedure. Post procedure Diagnosis Wound #2R: Same as Pre-Procedure . Plan Follow-up Appointments: Return Appointment in 1 week. - with Dr. Heber Reeder (Thurs/Fri) Other: - Patient given number to call General Surgery re: right posterior leg Cellular or Tissue Based Products: Cellular or Tissue Based Product Type: - Apligraf #7 09/01/21 Edema Control - Lymphedema / SCD / Other: Elevate legs to the level of the heart or above for 30 minutes daily and/or when sitting, a frequency of: Avoid standing for long periods of time. WOUND #2R: - Metatarsal head first Wound Laterality: Left Cleanser: Soap and Water 1 x Per Week/15 Days Discharge Instructions: May shower and wash wound with dial antibacterial soap and water prior to dressing change. Cleanser: Wound Cleanser (Generic) 1 x Per Week/15 Days Discharge Instructions: Cleanse the wound with wound cleanser prior to applying a clean dressing using gauze sponges, not tissue or cotton balls. Peri-Wound Care: Sween Lotion (Moisturizing lotion) 1 x Per Week/15 Days Discharge Instructions: Apply moisturizing lotion to leg Secondary Dressing: Woven Gauze Sponges 2x2 in (Generic) 1 x Per Week/15 Days Discharge  Instructions: Apply over primary dressing as directed. Secondary Dressing: Optifoam Non-Adhesive Dressing, 4x4 in (Generic) 1 x Per Week/15 Days Discharge Instructions: cut to make foam donut to help offload Secondary Dressing: ADAPTIC TOUCH 3x4.25 in 1 x Per Week/15 Days Discharge Instructions: Apply over primary dressing as directed. Secured With: Child psychotherapist, Sterile 2x75 (in/in) (Generic) 1 x Per Week/15 Days Discharge Instructions: Secure with stretch gauze as directed. Secured With: 52M Medipore H Soft Cloth Surgical T ape, 2x2 (in/yd) (Generic) 1 x Per Week/15 Days Discharge Instructions: Secure dressing with tape as directed. 1. In office sharp debridement 2. Apligraf #7 placed in standard fashion 3. Follow-up in 1 week Electronic Signature(s) Signed: 09/02/2021 12:51:36 PM By: Kalman Shan DO Entered By: Kalman Shan on 09/02/2021 12:51:10 -------------------------------------------------------------------------------- [facility] Details Patient Name: Date of Service: [name] [name], [name] 09/01/2021 4:00 PM Medical Record Number: [id number] Patient Account Number: [id number] Date of Birth/Sex: Treating RN: [birth year] (50 y.o. [name] Primary Care Provider: [name] Other Clinician: Referring Provider: Treating Provider/Extender: Delano Metz in Treatment: 65 Information Obtained From Patient Constitutional Symptoms (General Health) Medical History: Past Medical History Notes: falls , leukocytosis Eyes Medical History: Negative for: Cataracts; Glaucoma; Optic Neuritis Ear/Nose/Mouth/Throat Medical History: Negative for: Chronic sinus problems/congestion; Middle ear problems Hematologic/Lymphatic Medical History: Positive for: Anemia Negative for: Hemophilia; Human Immunodeficiency Virus; Lymphedema; Sickle Cell Disease Respiratory Medical History: Negative for: Aspiration; Asthma; Chronic  Obstructive Pulmonary Disease (COPD); Pneumothorax; Sleep Apnea; Tuberculosis Past Medical History Notes: During waking hours and catches himself not breathing, "gasp for air". Saw Dr Wynonia Lawman, he couldn't find a reason Cardiovascular Medical History: Positive for: Hypertension; Peripheral Venous Disease Negative for: Angina; Arrhythmia; Congestive Heart Failure; Coronary Artery Disease; Deep Vein Thrombosis; Hypotension; Myocardial Infarction; Peripheral Arterial Disease; Phlebitis; Vasculitis Gastrointestinal Medical History: Negative for: Cirrhosis ; Colitis; Crohns; Hepatitis A; Hepatitis B; Hepatitis C Endocrine Medical History: Positive for: Type  II Diabetes - oral meds Negative for: Type I Diabetes Time with diabetes: 2014 Treated with: Oral agents Blood sugar tested every day: Yes Tested : Blood sugar testing results: Breakfast: 91 Genitourinary Medical History: Negative for: End Stage Renal Disease Immunological Medical History: Negative for: Lupus Erythematosus; Raynauds; Scleroderma Integumentary (Skin) Medical History: Negative for: History of Burn Musculoskeletal Medical History: Negative for: Gout; Rheumatoid Arthritis; Osteoarthritis; Osteomyelitis Neurologic Medical History: Negative for: Dementia; Neuropathy; Quadriplegia; Paraplegia; Seizure Disorder Oncologic Medical History: Negative for: Received Chemotherapy; Received Radiation Psychiatric Medical History: Negative for: Anorexia/bulimia; Confinement Anxiety Immunizations Pneumococcal Vaccine: Received Pneumococcal Vaccination: No Implantable Devices No devices added Hospitalization / Surgery History Type of Hospitalization/Surgery fever 105, sepsis cellulitis right popliteal fossa 04/09/2021 Family and Social History Cancer: No; Diabetes: Yes - Maternal Grandparents,Mother; Heart Disease: Yes - Father; Hereditary Spherocytosis: No; Hypertension: Yes - Father; Kidney Disease: No; Lung Disease:  No; Seizures: No; Stroke: No; Thyroid Problems: No; Tuberculosis: No; Current every day smoker; Marital Status - Single; Alcohol Use: Never; Drug Use: No History; Caffeine Use: Never; Financial Concerns: No; Food, Clothing or Shelter Needs: No; Support System Lacking: No; Transportation Concerns: No Electronic Signature(s) Signed: 09/02/2021 12:51:36 PM By: Kalman Shan DO Signed: 09/02/2021 5:14:50 PM By: Lorrin Jackson Entered By: Kalman Shan on 09/02/2021 12:47:54 -------------------------------------------------------------------------------- [place] Details Patient Name: Date of Service: [name] [name], [name] 09/01/2021 Medical Record Number: [id number] Patient Account Number: [id number] Date of Birth/Sex: Treating RN: 26-Nov-1970 (50 y.o. [name] Primary Care Provider: [name] Other Clinician: Referring Provider: Treating Provider/Extender: Delano Metz in Treatment: 70 Diagnosis Coding ICD-10 Codes Code Description [phone] Non-pressure chronic ulcer of other part of left foot with fat layer exposed E11.621 Type 2 diabetes mellitus with foot ulcer S81.801D Unspecified open wound, right lower leg, subsequent encounter E11.42 Type 2 diabetes mellitus with diabetic polyneuropathy Z89.511 Acquired absence of right leg below knee Facility Procedures CPT4 Code: [id number] Description: (Facility Use Only) Apligraf 1 SQ CM ICD-10 Diagnosis Description L97.522 Non-pressure chronic ulcer of other part of left foot with fat layer exposed Modifier: Quantity: 39 CPT4 Code: [id number] Description: 15615 - SKIN SUB GRAFT FACE/NK/HF/G ICD-10 Diagnosis Description L97.522 Non-pressure chronic ulcer of other part of left foot with fat layer exposed Modifier: Quantity: 1 Physician Procedures : CPT4 Code Description Modifier [id number] - WC PHYS SKIN SUB GRAFT FACE/NK/HF/G ICD-10 Diagnosis Description L97.522 Non-pressure chronic ulcer of  other part of left foot with fat layer exposed Quantity: 1 Electronic Signature(s) Signed: 09/02/2021 12:51:36 PM By: Kalman Shan DO Previous Signature: 09/01/2021 5:11:49 PM Version By: Lorrin Jackson Entered By: Kalman Shan on 09/02/2021 12:51:20

## 2021-09-02 NOTE — Assessment & Plan Note (Signed)
   Patient exhibiting evidence of acute kidney injury secondary to significant prerenal injury with nearly 4 days of inability to tolerate oral intake with frequent bouts of nausea vomiting and occasional diarrhea Creatinine is currently 3.61 an increase compared to baseline of 2.25 Strict input and output monitoring Monitoring renal function and electrolytes with serial chemistries Avoiding nephrotoxic agents including holding patient's metformin and lisinopril No evidence of hydronephrosis or evidence of postobstructive uropathy on CT imaging of the abdomen and pelvis

## 2021-09-02 NOTE — Assessment & Plan Note (Signed)
   As patient is currently exhibiting normotension over low normal blood pressures holding majority of patient's scheduled antihypertensives for now  As patient is hydrated develops rising blood pressure will resume antihypertensives as tolerated\  As needed intravenous antihypertensives for markedly elevated blood pressures

## 2021-09-02 NOTE — ED Notes (Signed)
Received pt back from Endo, alert oriented x 4. Denies any complaints at this time. Pt requested some apple juice.

## 2021-09-02 NOTE — Progress Notes (Signed)
Pharmacy Antibiotic Note  STAN CANTAVE is a 50 y.o. male admitted on 09/01/2021 with sepsis.  Pharmacy has been consulted for Vancomycin/Zosyn dosing. WBC mildly elevated. Noted renal dysfunction.   Plan: Vancomycin 2000 mg IV q48h >>Estimated AUC: 433 Cefepime 2g IV q12h Trend WBC, temp, renal function  F/U infectious work-up Drug levels as indicated   Temp (24hrs), Avg:98.5 F (36.9 C), Min:97.5 F (36.4 C), Max:99.4 F (37.4 C)  Recent Labs  Lab 09/01/21 1956 09/02/21 0004 09/02/21 0204  WBC 12.8*  --   --   CREATININE 3.61*  --   --   LATICACIDVEN  --  3.5* 3.0*    CrCl cannot be calculated (Unknown ideal weight.).    Allergies  Allergen Reactions   Adhesive [Tape] Rash    Rash at site of tape--okay to use paper tape    Narda Bonds, PharmD, [place] [facility] Pharmacist Phone: [phone]

## 2021-09-02 NOTE — ED Notes (Signed)
Endo's here to take pt. Report given.

## 2021-09-02 NOTE — Progress Notes (Signed)
  INTERVAL PROGRESS NOTE    [name] [name]- 50 y.o. male  LOS: 0 __________________________________________________________________  SUBJECTIVE: Admitted 09/01/2021 with cc of  Chief Complaint  Patient presents with   Hyperglycemia  Patient has no concerns or complaints at this time Since admission, patient has been stable. Denies further Bms this am.  OBJECTIVE: Blood pressure (!) 165/99, pulse 85, temperature 98.7 F (37.1 C), temperature source Oral, resp. rate 14, height 6\' 5"  (1.956 m), weight 105.2 kg, SpO2 92 %.  General: NAD, pleasant, able to participate in exam Cardiac: RRR, normal heart sounds, no murmurs. 2+ radial and PT pulses bilaterally Respiratory: CTAB, normal effort, No wheezes, rales or rhonchi Abdomen: soft, nontender, nondistended, no hepatic or splenomegaly, +BS Extremities: R amputation Skin: warm and dry, no rashes noted Neuro: alert and oriented, no focal deficits Psych: Normal affect and mood   ASSESSMENT/PLAN:  I have reviewed the full H&P by Dr. Cyd Silence, and I agree with the assessment and plan as outlined therein. In addition: GI performed EGD today which revealed grade D esophagitis as source of bleed and will need repeat ECG in 8 weeks to evaluate for resolution after completing treatment per GI recommendations including: PPI BID, sucralfate QID.    Principal Problem:   Acute renal failure superimposed on stage 3b chronic kidney disease ([place]) Active Problems:   Uncontrolled type 2 diabetes mellitus with hyperglycemia, with long-term current use of insulin ([place])   Essential hypertension   GERD with esophagitis   Lactic acidosis   Hematemesis   Richarda Osmond, DO [facility] 09/02/2021, 5:47 PM    [email or web address] Available by Epic secure chat 7AM-7PM. If 7PM-7AM, please contact night-coverage   No Charge

## 2021-09-02 NOTE — Consult Note (Addendum)
[facility] Consult: 10:51 AM 09/02/2021  LOS: 0 days    Referring Provider: Dr Ouida Sills  Primary Care Physician:  [name], NP at [facility] family practice. Primary Gastroenterologist:  Dr. Wilfrid Lund    Reason for Consultation: Dark, bloody emesis.   HPI: [name] [name] is a 50 y.o. male.  PMH DM2/IDDM.  Peripheral neuropathy.  Right BKA.  Depression.  CKD stage III.  Iron deficiency anemia with Hgb 11.3,  ferritin 15 in mid September  04/2019 colonoscopy.  For eval constipation, FOBT positive.  Copious amount of semiliquid stool throughout colon requiring extensive lavage and resulting fair visualization.  3 polyps removed, 6 to 8 mm size (TAs, no HGD).  Not all polyps were retrieved.  Nonbleeding internal hemorrhoids. 05/2020 colonoscopy.  For surveillance of adenomas and previous suboptimal prep.  3 sessile polyps (TAs, no HGD) removed.  Left colon diverticulosis.  Redundant colon. 05/12/2021 gastric emptying study: Normal. 06/04/21 EGD: For evaluation of nausea and vomiting.  This was a normal study.  Dr. Hinton Dyer suspected nausea vomiting from episodic hyperglycemia.  Patient describes episodes of nausea and vomiting are preceded by coughing and occur mostly at night.  At GI office visit 07/30/2021 Dr. Rachael Darby adjusted medications.  Stopped ondansetron.  Continued metoclopramide 10 mg before evening meal.  Continue Protonix 40 mg bid.  Requested patient keep strict monitor of blood sugars before and after evening meals especially and to review these findings with his endocrinologist to see if he might need adjustments of his medications.  Patient is surprisingly not very aware of what medicines he is taking but is taking medicine before meals so probably taking the Reglan.  He is not sure if he is  taking pantoprazole.  Still tending to have nausea regularly, not necessarily daily however.  Vomits bilious, sometimes partially digested food several times weekly.  Also complaining of solid food dysphagia at the level of the upper esophagus but has not had to regurgitate and though food feels tight, it passes without problem.  Starting Friday, 5 days ago, developed acute emesis of dark bloody emesis.  Several episodes since then.  BMs were formed, brown but less frequent.  Less urine output.  Came to the ED yesterday.  Had a watery stool accompanied by a small amount of blood while in the ED.  Last episode of vomiting was yesterday morning but he still feels queasy.  Urine output has increased with IV fluids.  Patient does not smoke marijuana, does not use NSAIDs, no narcotics or illicit drugs.  Does not drink alcohol.  Lives with his mother in [place]. Denies family history of nausea, vomiting, stomach problems, intestinal issues or GI cancers.    Hgb 11.3 >> 9.8.  Was 12.52 to 3 weeks ago.  MCV 89.  Platelets normal.  Initial WBCs 12.8, now 8.3. AKI at 41/3.6.  GFR has gone from 35 in early June to current 19.  Potassium low at 3.3.  LFTs, lipase normal CTAP wo contrast: Wall thickening and inflammatory stranding at the distal esophagus, uncomplicated cholelithiasis.  Tiny,  bilateral, fat-containing inguinal hernias.  Aortic atherosclerosis. CXR unremarkable.  Past Medical History:  Diagnosis Date   Allergy    seasonal and environmental   Anemia    ARF (acute renal failure) ([place]) 09/2017   Chronic constipation 05/13/2020   Depression    Diabetes mellitus without complication ([place]) 1638   Hyperlipidemia    Hypertension    Necrotizing fasciitis ([place]) 10/13/2017   Neuromuscular disorder ([place])    neuropathy   Wound dehiscence 11/24/2017    Past Surgical History:  Procedure Laterality Date   AMPUTATION Right 10/13/2017   Procedure: RIGHT BELOW KNEE AMPUTATION;  Surgeon: [name], MD;  Location: [facility];  Service: Orthopedics;  Laterality: Right;   AMPUTATION Right 11/24/2017   Procedure: AMPUTATION BELOW KNEE REVISION;  Surgeon: [name], MD;  Location: [facility];  Service: Orthopedics;  Laterality: Right;   COLONOSCOPY  05/11/2019   NO PAST SURGERIES     POLYPECTOMY     WISDOM TOOTH EXTRACTION      Prior to Admission medications   Medication Sig Start Date End Date Taking? Authorizing Provider  amLODipine (NORVASC) 10 MG tablet Take 10 mg by mouth daily.   Yes [provider]  ARIPiprazole (ABILIFY) 20 MG tablet Take 20 mg by mouth daily. 08/06/19  Yes [provider]  atorvastatin (LIPITOR) 40 MG tablet Take 40 mg by mouth daily. 08/11/21  Yes [provider]  baclofen (LIORESAL) 10 MG tablet 1/2 tab twice a day Patient taking differently: Take 20 mg by mouth 2 (two) times daily as needed for muscle spasms. 01/28/18  Yes Mack Hook, MD  dapagliflozin propanediol (FARXIGA) 10 MG TABS tablet Take 10 mg by mouth daily. 04/08/20  Yes [provider]  desvenlafaxine (PRISTIQ) 100 MG 24 hr tablet Take 100 mg by mouth daily. 02/20/20  Yes [provider]  FLUoxetine (PROZAC) 40 MG capsule Take 40 mg by mouth daily. 03/26/21  Yes [provider]  fluticasone (FLONASE) 50 MCG/ACT nasal spray Place 2 sprays into both nostrils daily as needed for allergies or rhinitis.  01/09/19  Yes [provider]  gabapentin (NEURONTIN) 300 MG capsule 1 cap by mouth in morning and midday, 2 caps by mouth at bedtime Patient taking differently: Take 300-600 mg by mouth See admin instructions. Take one capsule (300 mg) by mouth in the morning and afternoon, take two capsules (600 mg) at bedtime 11/19/17  Yes Mack Hook, MD  glipiZIDE (GLUCOTROL) 5 MG tablet 1/2 tab by mouth twice daily with meal Patient taking differently: Take 5 mg by mouth daily with supper. 11/19/17  Yes Mack Hook, MD  lisinopril (ZESTRIL) 20  MG tablet Take 1 tablet (20 mg total) by mouth daily. 04/22/21  Yes Charlynne Cousins, MD  metFORMIN (GLUCOPHAGE-XR) 500 MG 24 hr tablet Take 1,000 mg by mouth 2 (two) times daily. 06/17/21  Yes [provider]  metoCLOPramide (REGLAN) 5 MG tablet Take 1 tablet (5 mg total) by mouth every 8 (eight) hours as needed for nausea or vomiting. 06/04/21  Yes Danis, Estill Cotta III, MD  metoprolol tartrate (LOPRESSOR) 25 MG tablet Take 25 mg by mouth 2 (two) times daily. 02/21/21  Yes [provider]  TRULICITY 1.5 GT/3.6IW SOPN Inject 1.5 mg into the skin once a week. Friday's 07/30/21  Yes [provider]  Leandra Kern LANCETS MISC Check blood sugars before meals twice daily 11/19/17   Mack Hook, MD  Blood Glucose Monitoring Suppl [facility] PRESTO) w/Device KIT Check  sugars twice daily before meals 11/19/17   Mack Hook, MD  glucose blood (AGAMATRIX PRESTO TEST) test strip Check blood sugars twice daily before meals 02/02/18   Mack Hook, MD  Insulin NPH, Human,, Isophane, (HUMULIN N KWIKPEN) 100 UNIT/ML Kiwkpen Inject 5 Units into the skin every morning. And pen needles 1/day Patient not taking: No sig reported 05/27/21   Renato Shin, MD  Insulin Pen Needle (PEN NEEDLES) 30G X 5 MM MISC check blood sugar 4 times a day 06/02/21   Renato Shin, MD  pantoprazole (PROTONIX) 40 MG tablet Take 1 tablet (40 mg total) by mouth 2 (two) times daily. Patient not taking: Reported on 09/02/2021 04/23/21   Daleen Bo, MD    Scheduled Meds:  insulin aspart  0-15 Units Subcutaneous TID AC & HS   pantoprazole (PROTONIX) IV  40 mg Intravenous Q12H   Infusions:  lactated ringers with kcl     PRN Meds: acetaminophen **OR** acetaminophen, fluticasone, hydrALAZINE, ondansetron **OR** ondansetron (ZOFRAN) IV   Allergies as of 09/01/2021 - Review Complete 09/01/2021  Allergen Reaction Noted   Adhesive [tape] Rash 11/19/2017    Family History  Problem  Relation Age of Onset   Diabetes Mellitus II Mother    Depression Mother    Colon polyps Mother    Heart disease Father        CABG x 4.  04/2017   Colon cancer Neg Hx    Esophageal cancer Neg Hx    Liver cancer Neg Hx    Pancreatic cancer Neg Hx    Rectal cancer Neg Hx    Stomach cancer Neg Hx     Social History   Socioeconomic History   Marital status: Divorced    Spouse name: Not on file   Number of children: 1   Years of education: 12   Highest education level: Associate degree: occupational, Hotel manager, or vocational program  Occupational History   Occupation: unemployed  Tobacco Use   Smoking status: Every Day    Packs/day: 0.25    Years: 35.00    Pack years: 8.75    Types: Cigarettes   Smokeless tobacco: Never   Tobacco comments:    stopped, but back on 1 cigarette daily.  Vaping Use   Vaping Use: Never used  Substance and Sexual Activity   Alcohol use: No   Drug use: No   Sexual activity: Not on file  Other Topics Concern   Not on file  Social History Narrative   Originally from  [place].   Is living with his mother, who essentially supports him now.   Social Determinants of Health   Financial Resource Strain: Not on file  Food Insecurity: Not on file  Transportation Needs: Not on file  Physical Activity: Not on file  Stress: Not on file  Social Connections: Not on file  Intimate Partner Violence: Not on file    REVIEW OF SYSTEMS: Constitutional: Feels worn out but no profound fatigue. ENT:  No nose bleeds Pulm: No shortness of breath or cough. CV:  No palpitations, no LE edema.  GU:  No hematuria, no frequency.  See HPI. GI: Per HPI. Heme: Other than the dark bloody emesis, no other unusual bleeding or bruising. Transfusions: None Neuro:  No headaches, no peripheral tingling or numbness.  No syncope, no seizures Derm:  No itching, no rash or sores.  Endocrine:  No sweats or chills.  No polyuria or dysuria Immunization:  Reviewed.   PHYSICAL EXAM: Vital signs in last  24 hours: Vitals:   09/02/21 0645 09/02/21 1000  BP: (!) 144/90 (!) 159/95  Pulse: 91 81  Resp: 14 10  Temp:    SpO2: 96% 95%   Wt Readings from Last 3 Encounters:  07/30/21 106.1 kg  06/04/21 103.9 kg  05/27/21 106.3 kg    General: Patient looks moderately ill and though alert, his response time is sluggish Head: No facial asymmetry or swelling.  No signs of head trauma. Eyes: No scleral icterus.  No conjunctival pallor. Ears: Not hard of hearing Nose: No congestion or discharge Mouth: Moist, pink, clear mucosa.  Fair dentition.  Tongue midline. Neck: No JVD, no masses, no thyromegaly. Lungs: Diminished but clear breath sounds.  Poor inspiratory effort.  No labored breathing.  No cough Heart: RRR.  No MRG.  S1, S2 present Abdomen: Soft without tenderness.  No HSM, masses, bruits, hernias.  Active bowel sounds..   Rectal: Deferred Musc/Skeltl: No joint redness, swelling or gross deformity. Extremities: No CCE.  Wrinkly skin on left lower leg suggesting previous edema. Neurologic: Alert.  Oriented x3.  Response time in terms of speech sluggish.  Moves all 4 limbs without tremor, strength not tested Skin: Skin is somewhat bronze. Nodes: No cervical adenopathy Psych: Pleasant, cooperative.  Affect blunted.  Intake/Output from previous day: 10/10 0701 - 10/11 0700 In: 1000 [IV Piggyback:1000] Out: -  Intake/Output this shift: Total I/O In: 500.6 [I.V.:500.6] Out: -   LAB RESULTS: Recent Labs    09/01/21 1956 09/02/21 0753  WBC 12.8* 8.3  HGB 11.3* 9.8*  HCT 36.4* 30.8*  PLT 238 191   BMET Lab Results  Component Value Date   NA 136 09/02/2021   NA 137 09/01/2021   NA 144 04/23/2021   K 3.3 (L) 09/02/2021   K 3.4 (L) 09/01/2021   K 4.2 04/23/2021   CL 99 09/02/2021   CL 94 (L) 09/01/2021   CL 105 04/23/2021   CO2 26 09/02/2021   CO2 30 09/01/2021   CO2 27 04/23/2021   GLUCOSE 163 (H) 09/02/2021   GLUCOSE  61 (L) 09/01/2021   GLUCOSE 254 (H) 04/23/2021   BUN 41 (H) 09/02/2021   BUN 39 (H) 09/01/2021   BUN 40 (H) 04/23/2021   CREATININE 3.68 (H) 09/02/2021   CREATININE 3.61 (H) 09/01/2021   CREATININE 2.25 (H) 04/23/2021   CALCIUM 7.6 (L) 09/02/2021   CALCIUM 8.1 (L) 09/01/2021   CALCIUM 8.8 (L) 04/23/2021   LFT Recent Labs    09/01/21 1956 09/02/21 0753  PROT 6.5 5.5*  ALBUMIN 3.0* 2.5*  AST 13* 11*  ALT 11 9  ALKPHOS 112 101  BILITOT 0.7 0.8   PT/INR Lab Results  Component Value Date   INR 0.9 09/01/2021   INR 1.0 04/23/2021   INR 1.0 04/09/2021   Hepatitis Panel No results for input(s): HEPBSAG, HCVAB, HEPAIGM, HEPBIGM in the last 72 hours. C-Diff No components found for: CDIFF Lipase     Component Value Date/Time   LIPASE 51 09/01/2021 1956    Drugs of Abuse     Component Value Date/Time   LABOPIA NONE DETECTED 04/23/2021 1933   COCAINSCRNUR NONE DETECTED 04/23/2021 1933   LABBENZ NONE DETECTED 04/23/2021 1933   AMPHETMU NONE DETECTED 04/23/2021 1933   THCU NONE DETECTED 04/23/2021 1933   LABBARB NONE DETECTED 04/23/2021 1933     RADIOLOGY STUDIES: CT ABDOMEN PELVIS WO CONTRAST  Result Date: 09/02/2021 CLINICAL DATA:  Abdominal pain, fever EXAM: CT ABDOMEN AND PELVIS WITHOUT CONTRAST TECHNIQUE:  Multidetector CT imaging of the abdomen and pelvis was performed following the standard protocol without IV contrast. COMPARISON:  None. FINDINGS: Lower chest: The visualized lung bases are clear. The visualized heart and pericardium are unremarkable. There is circumferential wall thickening involving the distal esophagus and paraesophageal inflammatory stranding in keeping with changes of esophagitis, possibly reflux esophagitis. Hepatobiliary: Cholelithiasis without pericholecystic inflammatory change noted. Liver unremarkable. No intra or extrahepatic biliary ductal dilation. Pancreas: Unremarkable Spleen: Unremarkable Adrenals/Urinary Tract: Nodular thickening of  the left adrenal gland is unchanged. The right adrenal gland is unremarkable. The kidneys are normal. The bladder is unremarkable. Stomach/Bowel: Stomach is within normal limits. Appendix appears normal. No evidence of bowel wall thickening, distention, or inflammatory changes. No free intraperitoneal gas or fluid. Vascular/Lymphatic: Minimal atherosclerotic calcification within the abdominal aorta. No aortic aneurysm. No pathologic adenopathy within the abdomen and pelvis. Reproductive: Prostate is unremarkable. Other: Tiny bilateral fat containing inguinal hernias. Rectum unremarkable. Musculoskeletal: No acute bone abnormality. No lytic or blastic bone lesion. IMPRESSION: No acute intra-abdominal pathology identified. No definite radiographic explanation for the patient's reported symptoms. Circumferential wall thickening and paraesophageal inflammatory stranding in keeping with esophagitis, possibly reflux esophagitis. This would be better assessed with endoscopy, if indicated. Cholelithiasis. Aortic Atherosclerosis (ICD10-I70.0). Electronically Signed   By: Fidela Salisbury M.D.   On: 09/02/2021 02:54   DG Chest 2 View  Result Date: 09/01/2021 CLINICAL DATA:  Fever, hyperglycemia, fatigue. EXAM: CHEST - 2 VIEW COMPARISON:  None. FINDINGS: Elevation of the left hemidiaphragm. Normal heart size and pulmonary vascularity. No focal airspace disease or consolidation in the lungs. No blunting of costophrenic angles. No pneumothorax. Mediastinal contours appear intact. Tortuous aorta. Degenerative changes in the spine. IMPRESSION: No active cardiopulmonary disease. Electronically Signed   By: Lucienne Capers M.D.   On: 09/01/2021 20:01      IMPRESSION:      Acute dark/bloody emesis in patient with chronic nausea and frequent nonbloody vomiting.  Gastric emptying study and EGD 3 to 4 months ago unrevealing.  Unclear how compliant patient is with suggested GI medical regimen.  CT shows esophagitis and  uncomplicated cholelithiasis, small, nonobstructing inguinal hernias.  So far received 80 mg bolus of Protonix and now on Protonix 40 mg IV q 12.       IDDM.     AKI, looks to have baseline CKD stage 3.     Adenomatous colon polyps on 2020 and 05/2020 colonoscopy.  Sepsis criteria.  Vancomycin and Maxipime administered but no ongoing orders for these.  PLAN:        Check AM cortisol level.   EGD, question timing.  Keep NPO for now in case EGD can be done today.      Azucena Freed  09/02/2021, 10:51 AM Phone [phone]

## 2021-09-02 NOTE — H&P (View-Only) (Signed)
[facility] Consult: 10:51 AM 09/02/2021  LOS: 0 days    Referring Provider: Dr Ouida Sills  Primary Care Physician:  [name], NP at [facility] family practice. Primary Gastroenterologist:  Dr. Wilfrid Lund    Reason for Consultation: Dark, bloody emesis.   HPI: [name] [name] is a 50 y.o. male.  PMH DM2/IDDM.  Peripheral neuropathy.  Right BKA.  Depression.  CKD stage III.  Iron deficiency anemia with Hgb 11.3,  ferritin 15 in mid September  04/2019 colonoscopy.  For eval constipation, FOBT positive.  Copious amount of semiliquid stool throughout colon requiring extensive lavage and resulting fair visualization.  3 polyps removed, 6 to 8 mm size (TAs, no HGD).  Not all polyps were retrieved.  Nonbleeding internal hemorrhoids. 05/2020 colonoscopy.  For surveillance of adenomas and previous suboptimal prep.  3 sessile polyps (TAs, no HGD) removed.  Left colon diverticulosis.  Redundant colon. 05/12/2021 gastric emptying study: Normal. 06/04/21 EGD: For evaluation of nausea and vomiting.  This was a normal study.  Dr. Hinton Dyer suspected nausea vomiting from episodic hyperglycemia.  Patient describes episodes of nausea and vomiting are preceded by coughing and occur mostly at night.  At GI office visit 07/30/2021 Dr. Rachael Darby adjusted medications.  Stopped ondansetron.  Continued metoclopramide 10 mg before evening meal.  Continue Protonix 40 mg bid.  Requested patient keep strict monitor of blood sugars before and after evening meals especially and to review these findings with his endocrinologist to see if he might need adjustments of his medications.  Patient is surprisingly not very aware of what medicines he is taking but is taking medicine before meals so probably taking the Reglan.  He is not sure if he is  taking pantoprazole.  Still tending to have nausea regularly, not necessarily daily however.  Vomits bilious, sometimes partially digested food several times weekly.  Also complaining of solid food dysphagia at the level of the upper esophagus but has not had to regurgitate and though food feels tight, it passes without problem.  Starting Friday, 5 days ago, developed acute emesis of dark bloody emesis.  Several episodes since then.  BMs were formed, brown but less frequent.  Less urine output.  Came to the ED yesterday.  Had a watery stool accompanied by a small amount of blood while in the ED.  Last episode of vomiting was yesterday morning but he still feels queasy.  Urine output has increased with IV fluids.  Patient does not smoke marijuana, does not use NSAIDs, no narcotics or illicit drugs.  Does not drink alcohol.  Lives with his mother in [place]. Denies family history of nausea, vomiting, stomach problems, intestinal issues or GI cancers.    Hgb 11.3 >> 9.8.  Was 12.52 to 3 weeks ago.  MCV 89.  Platelets normal.  Initial WBCs 12.8, now 8.3. AKI at 41/3.6.  GFR has gone from 35 in early June to current 19.  Potassium low at 3.3.  LFTs, lipase normal CTAP wo contrast: Wall thickening and inflammatory stranding at the distal esophagus, uncomplicated cholelithiasis.  Tiny,  bilateral, fat-containing inguinal hernias.  Aortic atherosclerosis. CXR unremarkable.  Past Medical History:  Diagnosis Date   Allergy    seasonal and environmental   Anemia    ARF (acute renal failure) ([place]) 09/2017   Chronic constipation 05/13/2020   Depression    Diabetes mellitus without complication ([place]) 1638   Hyperlipidemia    Hypertension    Necrotizing fasciitis ([place]) 10/13/2017   Neuromuscular disorder ([place])    neuropathy   Wound dehiscence 11/24/2017    Past Surgical History:  Procedure Laterality Date   AMPUTATION Right 10/13/2017   Procedure: RIGHT BELOW KNEE AMPUTATION;  Surgeon: [name], MD;  Location: [facility];  Service: Orthopedics;  Laterality: Right;   AMPUTATION Right 11/24/2017   Procedure: AMPUTATION BELOW KNEE REVISION;  Surgeon: [name], MD;  Location: [facility];  Service: Orthopedics;  Laterality: Right;   COLONOSCOPY  05/11/2019   NO PAST SURGERIES     POLYPECTOMY     WISDOM TOOTH EXTRACTION      Prior to Admission medications   Medication Sig Start Date End Date Taking? Authorizing Provider  amLODipine (NORVASC) 10 MG tablet Take 10 mg by mouth daily.   Yes [provider]  ARIPiprazole (ABILIFY) 20 MG tablet Take 20 mg by mouth daily. 08/06/19  Yes [provider]  atorvastatin (LIPITOR) 40 MG tablet Take 40 mg by mouth daily. 08/11/21  Yes [provider]  baclofen (LIORESAL) 10 MG tablet 1/2 tab twice a day Patient taking differently: Take 20 mg by mouth 2 (two) times daily as needed for muscle spasms. 01/28/18  Yes Mack Hook, MD  dapagliflozin propanediol (FARXIGA) 10 MG TABS tablet Take 10 mg by mouth daily. 04/08/20  Yes [provider]  desvenlafaxine (PRISTIQ) 100 MG 24 hr tablet Take 100 mg by mouth daily. 02/20/20  Yes [provider]  FLUoxetine (PROZAC) 40 MG capsule Take 40 mg by mouth daily. 03/26/21  Yes [provider]  fluticasone (FLONASE) 50 MCG/ACT nasal spray Place 2 sprays into both nostrils daily as needed for allergies or rhinitis.  01/09/19  Yes [provider]  gabapentin (NEURONTIN) 300 MG capsule 1 cap by mouth in morning and midday, 2 caps by mouth at bedtime Patient taking differently: Take 300-600 mg by mouth See admin instructions. Take one capsule (300 mg) by mouth in the morning and afternoon, take two capsules (600 mg) at bedtime 11/19/17  Yes Mack Hook, MD  glipiZIDE (GLUCOTROL) 5 MG tablet 1/2 tab by mouth twice daily with meal Patient taking differently: Take 5 mg by mouth daily with supper. 11/19/17  Yes Mack Hook, MD  lisinopril (ZESTRIL) 20  MG tablet Take 1 tablet (20 mg total) by mouth daily. 04/22/21  Yes Charlynne Cousins, MD  metFORMIN (GLUCOPHAGE-XR) 500 MG 24 hr tablet Take 1,000 mg by mouth 2 (two) times daily. 06/17/21  Yes [provider]  metoCLOPramide (REGLAN) 5 MG tablet Take 1 tablet (5 mg total) by mouth every 8 (eight) hours as needed for nausea or vomiting. 06/04/21  Yes Danis, Estill Cotta III, MD  metoprolol tartrate (LOPRESSOR) 25 MG tablet Take 25 mg by mouth 2 (two) times daily. 02/21/21  Yes [provider]  TRULICITY 1.5 GT/3.6IW SOPN Inject 1.5 mg into the skin once a week. Friday's 07/30/21  Yes [provider]  Leandra Kern LANCETS MISC Check blood sugars before meals twice daily 11/19/17   Mack Hook, MD  Blood Glucose Monitoring Suppl [facility] PRESTO) w/Device KIT Check  sugars twice daily before meals 11/19/17   Mack Hook, MD  glucose blood (AGAMATRIX PRESTO TEST) test strip Check blood sugars twice daily before meals 02/02/18   Mack Hook, MD  Insulin NPH, Human,, Isophane, (HUMULIN N KWIKPEN) 100 UNIT/ML Kiwkpen Inject 5 Units into the skin every morning. And pen needles 1/day Patient not taking: No sig reported 05/27/21   Renato Shin, MD  Insulin Pen Needle (PEN NEEDLES) 30G X 5 MM MISC check blood sugar 4 times a day 06/02/21   Renato Shin, MD  pantoprazole (PROTONIX) 40 MG tablet Take 1 tablet (40 mg total) by mouth 2 (two) times daily. Patient not taking: Reported on 09/02/2021 04/23/21   Daleen Bo, MD    Scheduled Meds:  insulin aspart  0-15 Units Subcutaneous TID AC & HS   pantoprazole (PROTONIX) IV  40 mg Intravenous Q12H   Infusions:  lactated ringers with kcl     PRN Meds: acetaminophen **OR** acetaminophen, fluticasone, hydrALAZINE, ondansetron **OR** ondansetron (ZOFRAN) IV   Allergies as of 09/01/2021 - Review Complete 09/01/2021  Allergen Reaction Noted   Adhesive [tape] Rash 11/19/2017    Family History  Problem  Relation Age of Onset   Diabetes Mellitus II Mother    Depression Mother    Colon polyps Mother    Heart disease Father        CABG x 4.  04/2017   Colon cancer Neg Hx    Esophageal cancer Neg Hx    Liver cancer Neg Hx    Pancreatic cancer Neg Hx    Rectal cancer Neg Hx    Stomach cancer Neg Hx     Social History   Socioeconomic History   Marital status: Divorced    Spouse name: Not on file   Number of children: 1   Years of education: 12   Highest education level: Associate degree: occupational, Hotel manager, or vocational program  Occupational History   Occupation: unemployed  Tobacco Use   Smoking status: Every Day    Packs/day: 0.25    Years: 35.00    Pack years: 8.75    Types: Cigarettes   Smokeless tobacco: Never   Tobacco comments:    stopped, but back on 1 cigarette daily.  Vaping Use   Vaping Use: Never used  Substance and Sexual Activity   Alcohol use: No   Drug use: No   Sexual activity: Not on file  Other Topics Concern   Not on file  Social History Narrative   Originally from  [place].   Is living with his mother, who essentially supports him now.   Social Determinants of Health   Financial Resource Strain: Not on file  Food Insecurity: Not on file  Transportation Needs: Not on file  Physical Activity: Not on file  Stress: Not on file  Social Connections: Not on file  Intimate Partner Violence: Not on file    REVIEW OF SYSTEMS: Constitutional: Feels worn out but no profound fatigue. ENT:  No nose bleeds Pulm: No shortness of breath or cough. CV:  No palpitations, no LE edema.  GU:  No hematuria, no frequency.  See HPI. GI: Per HPI. Heme: Other than the dark bloody emesis, no other unusual bleeding or bruising. Transfusions: None Neuro:  No headaches, no peripheral tingling or numbness.  No syncope, no seizures Derm:  No itching, no rash or sores.  Endocrine:  No sweats or chills.  No polyuria or dysuria Immunization:  Reviewed.   PHYSICAL EXAM: Vital signs in last  24 hours: Vitals:   09/02/21 0645 09/02/21 1000  BP: (!) 144/90 (!) 159/95  Pulse: 91 81  Resp: 14 10  Temp:    SpO2: 96% 95%   Wt Readings from Last 3 Encounters:  07/30/21 106.1 kg  06/04/21 103.9 kg  05/27/21 106.3 kg    General: Patient looks moderately ill and though alert, his response time is sluggish Head: No facial asymmetry or swelling.  No signs of head trauma. Eyes: No scleral icterus.  No conjunctival pallor. Ears: Not hard of hearing Nose: No congestion or discharge Mouth: Moist, pink, clear mucosa.  Fair dentition.  Tongue midline. Neck: No JVD, no masses, no thyromegaly. Lungs: Diminished but clear breath sounds.  Poor inspiratory effort.  No labored breathing.  No cough Heart: RRR.  No MRG.  S1, S2 present Abdomen: Soft without tenderness.  No HSM, masses, bruits, hernias.  Active bowel sounds..   Rectal: Deferred Musc/Skeltl: No joint redness, swelling or gross deformity. Extremities: No CCE.  Wrinkly skin on left lower leg suggesting previous edema. Neurologic: Alert.  Oriented x3.  Response time in terms of speech sluggish.  Moves all 4 limbs without tremor, strength not tested Skin: Skin is somewhat bronze. Nodes: No cervical adenopathy Psych: Pleasant, cooperative.  Affect blunted.  Intake/Output from previous day: 10/10 0701 - 10/11 0700 In: 1000 [IV Piggyback:1000] Out: -  Intake/Output this shift: Total I/O In: 500.6 [I.V.:500.6] Out: -   LAB RESULTS: Recent Labs    09/01/21 1956 09/02/21 0753  WBC 12.8* 8.3  HGB 11.3* 9.8*  HCT 36.4* 30.8*  PLT 238 191   BMET Lab Results  Component Value Date   NA 136 09/02/2021   NA 137 09/01/2021   NA 144 04/23/2021   K 3.3 (L) 09/02/2021   K 3.4 (L) 09/01/2021   K 4.2 04/23/2021   CL 99 09/02/2021   CL 94 (L) 09/01/2021   CL 105 04/23/2021   CO2 26 09/02/2021   CO2 30 09/01/2021   CO2 27 04/23/2021   GLUCOSE 163 (H) 09/02/2021   GLUCOSE  61 (L) 09/01/2021   GLUCOSE 254 (H) 04/23/2021   BUN 41 (H) 09/02/2021   BUN 39 (H) 09/01/2021   BUN 40 (H) 04/23/2021   CREATININE 3.68 (H) 09/02/2021   CREATININE 3.61 (H) 09/01/2021   CREATININE 2.25 (H) 04/23/2021   CALCIUM 7.6 (L) 09/02/2021   CALCIUM 8.1 (L) 09/01/2021   CALCIUM 8.8 (L) 04/23/2021   LFT Recent Labs    09/01/21 1956 09/02/21 0753  PROT 6.5 5.5*  ALBUMIN 3.0* 2.5*  AST 13* 11*  ALT 11 9  ALKPHOS 112 101  BILITOT 0.7 0.8   PT/INR Lab Results  Component Value Date   INR 0.9 09/01/2021   INR 1.0 04/23/2021   INR 1.0 04/09/2021   Hepatitis Panel No results for input(s): HEPBSAG, HCVAB, HEPAIGM, HEPBIGM in the last 72 hours. C-Diff No components found for: CDIFF Lipase     Component Value Date/Time   LIPASE 51 09/01/2021 1956    Drugs of Abuse     Component Value Date/Time   LABOPIA NONE DETECTED 04/23/2021 1933   COCAINSCRNUR NONE DETECTED 04/23/2021 1933   LABBENZ NONE DETECTED 04/23/2021 1933   AMPHETMU NONE DETECTED 04/23/2021 1933   THCU NONE DETECTED 04/23/2021 1933   LABBARB NONE DETECTED 04/23/2021 1933     RADIOLOGY STUDIES: CT ABDOMEN PELVIS WO CONTRAST  Result Date: 09/02/2021 CLINICAL DATA:  Abdominal pain, fever EXAM: CT ABDOMEN AND PELVIS WITHOUT CONTRAST TECHNIQUE:  Multidetector CT imaging of the abdomen and pelvis was performed following the standard protocol without IV contrast. COMPARISON:  None. FINDINGS: Lower chest: The visualized lung bases are clear. The visualized heart and pericardium are unremarkable. There is circumferential wall thickening involving the distal esophagus and paraesophageal inflammatory stranding in keeping with changes of esophagitis, possibly reflux esophagitis. Hepatobiliary: Cholelithiasis without pericholecystic inflammatory change noted. Liver unremarkable. No intra or extrahepatic biliary ductal dilation. Pancreas: Unremarkable Spleen: Unremarkable Adrenals/Urinary Tract: Nodular thickening of  the left adrenal gland is unchanged. The right adrenal gland is unremarkable. The kidneys are normal. The bladder is unremarkable. Stomach/Bowel: Stomach is within normal limits. Appendix appears normal. No evidence of bowel wall thickening, distention, or inflammatory changes. No free intraperitoneal gas or fluid. Vascular/Lymphatic: Minimal atherosclerotic calcification within the abdominal aorta. No aortic aneurysm. No pathologic adenopathy within the abdomen and pelvis. Reproductive: Prostate is unremarkable. Other: Tiny bilateral fat containing inguinal hernias. Rectum unremarkable. Musculoskeletal: No acute bone abnormality. No lytic or blastic bone lesion. IMPRESSION: No acute intra-abdominal pathology identified. No definite radiographic explanation for the patient's reported symptoms. Circumferential wall thickening and paraesophageal inflammatory stranding in keeping with esophagitis, possibly reflux esophagitis. This would be better assessed with endoscopy, if indicated. Cholelithiasis. Aortic Atherosclerosis (ICD10-I70.0). Electronically Signed   By: Fidela Salisbury M.D.   On: 09/02/2021 02:54   DG Chest 2 View  Result Date: 09/01/2021 CLINICAL DATA:  Fever, hyperglycemia, fatigue. EXAM: CHEST - 2 VIEW COMPARISON:  None. FINDINGS: Elevation of the left hemidiaphragm. Normal heart size and pulmonary vascularity. No focal airspace disease or consolidation in the lungs. No blunting of costophrenic angles. No pneumothorax. Mediastinal contours appear intact. Tortuous aorta. Degenerative changes in the spine. IMPRESSION: No active cardiopulmonary disease. Electronically Signed   By: Lucienne Capers M.D.   On: 09/01/2021 20:01      IMPRESSION:      Acute dark/bloody emesis in patient with chronic nausea and frequent nonbloody vomiting.  Gastric emptying study and EGD 3 to 4 months ago unrevealing.  Unclear how compliant patient is with suggested GI medical regimen.  CT shows esophagitis and  uncomplicated cholelithiasis, small, nonobstructing inguinal hernias.  So far received 80 mg bolus of Protonix and now on Protonix 40 mg IV q 12.       IDDM.     AKI, looks to have baseline CKD stage 3.     Adenomatous colon polyps on 2020 and 05/2020 colonoscopy.  Sepsis criteria.  Vancomycin and Maxipime administered but no ongoing orders for these.  PLAN:        Check AM cortisol level.   EGD, question timing.  Keep NPO for now in case EGD can be done today.      Azucena Freed  09/02/2021, 10:51 AM Phone [phone]

## 2021-09-02 NOTE — Interval H&P Note (Signed)
History and Physical Interval Note:  09/02/2021 2:32 PM  [name] [name]  has presented today for surgery, with the diagnosis of Bloody dark emesis.  Chronic nausea vomiting..  The various methods of treatment have been discussed with the patient and family. After consideration of risks, benefits and other options for treatment, the patient has consented to  Procedure(s): ESOPHAGOGASTRODUODENOSCOPY (EGD) WITH PROPOFOL (N/A) as a surgical intervention.  The patient's history has been reviewed, patient examined, no change in status, stable for surgery.  I have reviewed the patient's chart and labs.  Questions were answered to the patient's satisfaction.     Sharyn Creamer

## 2021-09-02 NOTE — Anesthesia Preprocedure Evaluation (Signed)
Anesthesia Evaluation  Patient identified by MRN, date of birth, ID band Patient awake    Reviewed: Allergy & Precautions, NPO status , Patient's Chart, lab work & pertinent test results  Airway Mallampati: II  TM Distance: >3 FB Neck ROM: Full    Dental no notable dental hx.    Pulmonary Current Smoker,    Pulmonary exam normal breath sounds clear to auscultation       Cardiovascular hypertension, + Peripheral Vascular Disease  Normal cardiovascular exam Rhythm:Regular Rate:Normal     Neuro/Psych negative neurological ROS  negative psych ROS   GI/Hepatic negative GI ROS, Neg liver ROS,   Endo/Other  diabetes, Poorly Controlled, Insulin Dependent  Renal/GU Renal InsufficiencyRenal disease  negative genitourinary   Musculoskeletal negative musculoskeletal ROS (+)   Abdominal   Peds negative pediatric ROS (+)  Hematology  (+) anemia ,   Anesthesia Other Findings   Reproductive/Obstetrics negative OB ROS                             Anesthesia Physical Anesthesia Plan  ASA: 3  Anesthesia Plan: MAC   Post-op Pain Management:    Induction: Intravenous  PONV Risk Score and Plan: 1 and Propofol infusion  Airway Management Planned: Nasal Cannula  Additional Equipment:   Intra-op Plan:   Post-operative Plan:   Informed Consent: I have reviewed the patients History and Physical, chart, labs and discussed the procedure including the risks, benefits and alternatives for the proposed anesthesia with the patient or authorized representative who has indicated his/her understanding and acceptance.     Dental advisory given  Plan Discussed with: CRNA and Surgeon  Anesthesia Plan Comments:         Anesthesia Quick Evaluation

## 2021-09-02 NOTE — Assessment & Plan Note (Signed)
.   Patient been placed on Accu-Cheks before every meal and nightly with sliding scale insulin . Holding home regimen of oral hypoglycemics and scheduled insulin therapy . Hemoglobin A1C ordered

## 2021-09-02 NOTE — ED Notes (Signed)
PA McDonald aware of the critical lactice acid level 3.5

## 2021-09-02 NOTE — Transfer of Care (Signed)
2Immediate Anesthesia Transfer of Care Note  Patient: [name] [name]  Procedure(s) Performed: ESOPHAGOGASTRODUODENOSCOPY (EGD) WITH PROPOFOL BIOPSY  Patient Location: Endoscopy Unit  Anesthesia Type:MAC  Level of Consciousness: awake, alert  and oriented  Airway & Oxygen Therapy: Patient Spontanous Breathing and Patient connected to nasal cannula oxygen  Post-op Assessment: Report given to RN and Post -op Vital signs reviewed and stable  Post vital signs: Reviewed and stable  Last Vitals:  Vitals Value Taken Time  BP 115/74 09/02/21 1456  Temp    Pulse 98 09/02/21 1457  Resp 20 09/02/21 1457  SpO2 99 % 09/02/21 1457  Vitals shown include unvalidated device data.  Last Pain:  Vitals:   09/02/21 1329  TempSrc: Temporal  PainSc: 6          Complications: No notable events documented.

## 2021-09-02 NOTE — Progress Notes (Signed)
Inpatient Diabetes Program Recommendations  AACE/ADA: New Consensus Statement on Inpatient Glycemic Control (2015)  Target Ranges:  Prepandial:   less than 140 mg/dL      Peak postprandial:   less than 180 mg/dL (1-2 hours)      Critically ill patients:  140 - 180 mg/dL   Lab Results  Component Value Date   GLUCAP 167 (H) 09/02/2021   HGBA1C 8.5 (H) 09/02/2021    Review of Glycemic Control  Diabetes history: DM 2 Outpatient Diabetes medications: Farxiga 10 mg Daily, metformin 1308 mg bid, Trulicity 1.5 mg QFriday, Glipizide 5 mg Daily at supper Current orders for Inpatient glycemic control:  Novolog 0-15 units tid and hs  A1c 8.5% on 10/11 Glucose trends 81-167  Inpatient Diabetes Program Recommendations:    - Reduce Novolog Correction to sensitive due to renal function 0-9 units tid If glucose trends elevated still add low dose NPH (pt has been on this in the past)  -D/c Metformin (can lead to AKI and also produce lactic acidosis) and Farxiga (can lead to acute kidney injury) at time of d/c  Thanks,  Tama Headings RN, MSN, BC-ADM Inpatient Diabetes Coordinator Team Pager [phone] (8a-5p)

## 2021-09-03 ENCOUNTER — Inpatient Hospital Stay (HOSPITAL_COMMUNITY)
Admission: RE | Admit: 2021-09-03 | Discharge: 2021-09-03 | Disposition: A | Discharge status: Home / Self Care | Payer: Medicare Other | Source: Ambulatory Visit | Attending: Nephrology | Admitting: Nephrology

## 2021-09-03 DIAGNOSIS — N178 Other acute kidney failure: Secondary | ICD-10-CM

## 2021-09-03 LAB — CBC
HCT: 32.3 % — ABNORMAL LOW (ref 39.0–52.0)
Hemoglobin: 10.3 g/dL — ABNORMAL LOW (ref 13.0–17.0)
MCH: 28.2 pg (ref 26.0–34.0)
MCHC: 31.9 g/dL (ref 30.0–36.0)
MCV: 88.5 fL (ref 80.0–100.0)
Platelets: 181 10*3/uL (ref 150–400)
RBC: 3.65 MIL/uL — ABNORMAL LOW (ref 4.22–5.81)
RDW: 13.8 % (ref 11.5–15.5)
WBC: 7.3 10*3/uL (ref 4.0–10.5)
nRBC: 0 % (ref 0.0–0.2)

## 2021-09-03 LAB — GLUCOSE, CAPILLARY: Glucose-Capillary: 173 mg/dL — ABNORMAL HIGH (ref 70–99)

## 2021-09-03 MED ORDER — SUCRALFATE 1 GM/10ML PO SUSP
1.0000 g | Freq: Three times a day (TID) | ORAL | 0 refills | Status: DC
Start: 2021-09-03 — End: 2022-09-11

## 2021-09-03 NOTE — Progress Notes (Signed)
[name], BOHLKEN ([id number]) Visit Report for 08/29/2021 Arrival Information Details Patient Name: Date of Service: [name] [name] 08/29/2021 9:30 A M Medical Record Number: [id number] Patient Account Number: [id number] Date of Birth/Sex: Treating RN: May 16, 1971 (50 y.o. [name] [name], [name] [name] Primary Care Traver Meckes: [name] Other Clinician: Referring Bodi Palmeri: Treating Leanord Thibeau/Extender: Delano Metz in Treatment: 35 Visit Information History Since Last Visit Added or deleted any medications: No Patient Arrived: Ambulatory Any new allergies or adverse reactions: No Arrival Time: 10:10 Had a fall or experienced change in No Accompanied By: self activities of daily living that may affect Transfer Assistance: None risk of falls: Patient Identification Verified: Yes Signs or symptoms of abuse/neglect since last visito No Secondary Verification Process Completed: Yes Hospitalized since last visit: No Patient Requires Transmission-Based Precautions: No Implantable device outside of the clinic excluding No Patient Has Alerts: No cellular tissue based products placed in the center since last visit: Has Dressing in Place as Prescribed: Yes Pain Present Now: No Electronic Signature(s) Signed: 09/03/2021 1:46:44 PM By: Sandre Kitty Entered By: Sandre Kitty on 08/29/2021 10:10:53 -------------------------------------------------------------------------------- Clinic Level of Care Assessment Details Patient Name: Date of Service: [name] [name] 08/29/2021 9:30 A M Medical Record Number: [id number] Patient Account Number: [id number] Date of Birth/Sex: Treating RN: [birth year] (50 y.o. [name] [name] Primary Care Soniya Ashraf: [name] Other Clinician: Referring Marguerite Barba: Treating Hovanes Hymas/Extender: Delano Metz in Treatment: 2 Clinic Level of Care Assessment Items TOOL 4 Quantity Score X- 1 0 Use  when only an EandM is performed on FOLLOW-UP visit ASSESSMENTS - Nursing Assessment / Reassessment X- 1 10 Reassessment of Co-morbidities (includes updates in patient status) X- 1 5 Reassessment of Adherence to Treatment Plan ASSESSMENTS - Wound and Skin A ssessment / Reassessment X - Simple Wound Assessment / Reassessment - one wound 1 5 '[]'  - 0 Complex Wound Assessment / Reassessment - multiple wounds '[]'  - 0 Dermatologic / Skin Assessment (not related to wound area) ASSESSMENTS - Focused Assessment '[]'  - 0 Circumferential Edema Measurements - multi extremities '[]'  - 0 Nutritional Assessment / Counseling / Intervention X- 1 5 Lower Extremity Assessment (monofilament, tuning fork, pulses) '[]'  - 0 Peripheral Arterial Disease Assessment (using hand held doppler) ASSESSMENTS - Ostomy and/or Continence Assessment and Care '[]'  - 0 Incontinence Assessment and Management '[]'  - 0 Ostomy Care Assessment and Management (repouching, etc.) PROCESS - Coordination of Care X - Simple Patient / Family Education for ongoing care 1 15 '[]'  - 0 Complex (extensive) Patient / Family Education for ongoing care X- 1 10 Staff obtains Programmer, systems, Records, T Results / Process Orders est '[]'  - 0 Staff telephones HHA, Nursing Homes / Clarify orders / etc '[]'  - 0 Routine Transfer to another Facility (non-emergent condition) '[]'  - 0 Routine Hospital Admission (non-emergent condition) '[]'  - 0 New Admissions / Biomedical engineer / Ordering NPWT Apligraf, etc. , '[]'  - 0 Emergency Hospital Admission (emergent condition) X- 1 10 Simple Discharge Coordination '[]'  - 0 Complex (extensive) Discharge Coordination PROCESS - Special Needs '[]'  - 0 Pediatric / Minor Patient Management '[]'  - 0 Isolation Patient Management '[]'  - 0 Hearing / Language / Visual special needs '[]'  - 0 Assessment of Community assistance (transportation, D/C planning, etc.) '[]'  - 0 Additional assistance / Altered mentation '[]'  - 0 Support  Surface(s) Assessment (bed, cushion, seat, etc.) INTERVENTIONS - Wound Cleansing / Measurement X - Simple Wound Cleansing - one wound 1 5 '[]'  - 0 Complex Wound Cleansing -  multiple wounds X- 1 5 Wound Imaging (photographs - any number of wounds) '[]'  - 0 Wound Tracing (instead of photographs) X- 1 5 Simple Wound Measurement - one wound '[]'  - 0 Complex Wound Measurement - multiple wounds INTERVENTIONS - Wound Dressings X - Small Wound Dressing one or multiple wounds 1 10 '[]'  - 0 Medium Wound Dressing one or multiple wounds '[]'  - 0 Large Wound Dressing one or multiple wounds '[]'  - 0 Application of Medications - topical '[]'  - 0 Application of Medications - injection INTERVENTIONS - Miscellaneous '[]'  - 0 External ear exam '[]'  - 0 Specimen Collection (cultures, biopsies, blood, body fluids, etc.) '[]'  - 0 Specimen(s) / Culture(s) sent or taken to Lab for analysis '[]'  - 0 Patient Transfer (multiple staff / Civil Service fast streamer / Similar devices) '[]'  - 0 Simple Staple / Suture removal (25 or less) '[]'  - 0 Complex Staple / Suture removal (26 or more) '[]'  - 0 Hypo / Hyperglycemic Management (close monitor of Blood Glucose) '[]'  - 0 Ankle / Brachial Index (ABI) - do not check if billed separately X- 1 5 Vital Signs Has the patient been seen at the hospital within the last three years: Yes Total Score: 90 Level Of Care: New/Established - Level 3 Electronic Signature(s) Signed: 09/01/2021 4:58:30 PM By: Levan Hurst RN, BSN Entered By: Levan Hurst on 08/29/2021 12:31:09 -------------------------------------------------------------------------------- Encounter Discharge Information Details Patient Name: Date of Service: [name] [name], [name] 08/29/2021 9:30 A M Medical Record Number: [id number] Patient Account Number: [id number] Date of Birth/Sex: Treating RN: 04/04/1971 (50 y.o. [name] [name] Primary Care Adaira Centola: [name] Other Clinician: Referring Afra Tricarico: Treating  Alasdair Kleve/Extender: Delano Metz in Treatment: 37 Encounter Discharge Information Items Discharge Condition: Stable Ambulatory Status: Ambulatory Discharge Destination: Home Transportation: Private Auto Accompanied By: alone Schedule Follow-up Appointment: Yes Clinical Summary of Care: Patient Declined Electronic Signature(s) Signed: 09/01/2021 4:58:30 PM By: Levan Hurst RN, BSN Entered By: Levan Hurst on 08/29/2021 12:32:19 -------------------------------------------------------------------------------- General Visit Notes Details Patient Name: Date of Service: [name] [name], [name] 08/29/2021 9:30 A M Medical Record Number: [id number] Patient Account Number: [id number] Date of Birth/Sex: Treating RN: 1971-03-22 (50 y.o. [name] [name] Primary Care Ashe Gago: [name] Other Clinician: Referring Alba Perillo: Treating Najmah Carradine/Extender: Delano Metz in Treatment: 41 Notes This RN entered treatment room to perform assessment, noted that patient had vomited a copious amount of watery vomitus, patient alert and oriented, states he has not been feeling well the past few days, Dr. Heber [place] also observed pt and recommended he go to ER for evaluation, blood glucose checked during visit, reading 420, pt states he will go to ER for eval. Electronic Signature(s) Signed: 09/01/2021 4:58:30 PM By: Levan Hurst RN, BSN Entered By: Levan Hurst on 08/29/2021 10:54:31 -------------------------------------------------------------------------------- Multi Wound Chart Details Patient Name: Date of Service: [name] [name], [name] 08/29/2021 9:30 A M Medical Record Number: [id number] Patient Account Number: [id number] Date of Birth/Sex: Treating RN: [birth year] (50 y.o. [name] Primary Care Madeliene Tejera: [name] Other Clinician: Referring Sharlie Shreffler: Treating Camora Tremain/Extender: Delano Metz in  Treatment: 104 Vital Signs Height(in): 77 Pulse(bpm): 108 Weight(lbs): 232 Blood Pressure(mmHg): 187/98 Body Mass Index(BMI): 28 Temperature(F): 97.9 Respiratory Rate(breaths/min): 17 Photos: [N/A:N/A] Left Metatarsal head first N/A N/A Wound Location: Blister N/A N/A Wounding Event: Diabetic Wound/Ulcer of the Lower N/A N/A Primary Etiology: Extremity Anemia, Hypertension, Peripheral N/A N/A Comorbid History: Venous Disease, Type II Diabetes 06/23/2020 N/A N/A Date Acquired: 90 N/A N/A Weeks  of Treatment: Open N/A N/A Wound Status: Yes N/A N/A Wound Recurrence: 0.2x0.2x0.2 N/A N/A Measurements L x W x D (cm) 0.031 N/A N/A A (cm) : rea 0.006 N/A N/A Volume (cm) : 0.00% N/A N/A % Reduction in A rea: -100.00% N/A N/A % Reduction in Volume: Grade 2 N/A N/A Classification: Small N/A N/A Exudate A mount: Serosanguineous N/A N/A Exudate Type: red, brown N/A N/A Exudate Color: Well defined, not attached N/A N/A Wound Margin: Large (67-100%) N/A N/A Granulation A mount: Red N/A N/A Granulation Quality: None Present (0%) N/A N/A Necrotic A mount: Fat Layer (Subcutaneous Tissue): Yes N/A N/A Exposed Structures: Fascia: No Tendon: No Muscle: No Joint: No Bone: No None N/A N/A Epithelialization: Treatment Notes Electronic Signature(s) Signed: 08/29/2021 10:52:33 AM By: Kalman Shan DO Signed: 08/29/2021 12:34:01 PM By: Baruch Gouty RN, BSN Entered By: Kalman Shan on 08/29/2021 10:47:05 -------------------------------------------------------------------------------- Multi-Disciplinary Care Plan Details Patient Name: Date of Service: [name] [name], [name] 08/29/2021 9:30 A M Medical Record Number: [id number] Patient Account Number: [id number] Date of Birth/Sex: Treating RN: Jan 17, 1971 (50 y.o. [name] [name] Primary Care Ketrina Boateng: [name] Other Clinician: Referring Annsley Akkerman: Treating Bulmaro Feagans/Extender: Delano Metz in Treatment: 60 Multidisciplinary Care Plan reviewed with physician Active Inactive Wound/Skin Impairment Nursing Diagnoses: Impaired tissue integrity Goals: Patient/caregiver will verbalize understanding of skin care regimen Date Initiated: 11/19/2020 Target Resolution Date: 09/16/2021 Goal Status: Active Ulcer/skin breakdown will have a volume reduction of 30% by week 4 Date Initiated: 08/16/2020 Date Inactivated: 03/03/2021 Target Resolution Date: 03/10/2021 Goal Status: Met Interventions: Provide education on ulcer and skin care Notes: 07/24/21: Wound care regimen ongoing. Electronic Signature(s) Signed: 09/01/2021 4:58:30 PM By: Levan Hurst RN, BSN Entered By: Levan Hurst on 08/29/2021 12:29:14 -------------------------------------------------------------------------------- Pain Assessment Details Patient Name: Date of Service: [name] [name], [place] 08/29/2021 9:30 A M Medical Record Number: [id number] Patient Account Number: [id number] Date of Birth/Sex: Treating RN: 07-Jan-1971 (50 y.o. [name] Primary Care Yassir Enis: [name] Other Clinician: Referring Jaivon Vanbeek: Treating Naylah Cork/Extender: Delano Metz in Treatment: 55 Active Problems Location of Pain Severity and Description of Pain Patient Has Paino No Site Locations Pain Management and Medication Current Pain Management: Electronic Signature(s) Signed: 08/29/2021 12:34:01 PM By: Baruch Gouty RN, BSN Signed: 09/03/2021 1:46:44 PM By: Sandre Kitty Entered By: Sandre Kitty on 08/29/2021 10:12:21 -------------------------------------------------------------------------------- Patient/Caregiver Education Details Patient Name: Date of Service: [name] [name] 10/7/2022andnbsp9:30 A M Medical Record Number: [id number] Patient Account Number: [id number] Date of Birth/Gender: Treating RN: 05-12-1971 (50 y.o. [name] [name] Primary Care  Physician: [name] Other Clinician: Referring Physician: Treating Physician/Extender: Delano Metz in Treatment: 110 Education Assessment Education Provided To: Patient Education Topics Provided Wound/Skin Impairment: Methods: Explain/Verbal Responses: State content correctly [employer]) Signed: 09/01/2021 4:58:30 PM By: Levan Hurst RN, BSN Entered By: Levan Hurst on 08/29/2021 12:30:16 -------------------------------------------------------------------------------- Wound Assessment Details Patient Name: Date of Service: [name] [name], [name] 08/29/2021 9:30 A M Medical Record Number: [id number] Patient Account Number: [id number] Date of Birth/Sex: Treating RN: 10/31/1971 (50 y.o. [name] Primary Care Lucifer Soja: [name] Other Clinician: Referring Kazuo Durnil: Treating Lamario Mani/Extender: Delano Metz in Treatment: 67 Wound Status Wound Number: [id number] Primary Diabetic Wound/Ulcer of the Lower Extremity Etiology: Wound Location: Left Metatarsal head first Wound Status: Open Wounding Event: Blister Comorbid Anemia, Hypertension, Peripheral Venous Disease, Type II Date Acquired: 06/23/2020 History: Diabetes Weeks Of Treatment: 54 Clustered Wound: No Photos Wound Measurements Length: (  cm) 0.2 Width: (cm) 0.2 Depth: (cm) 0.2 Area: (cm) 0.031 Volume: (cm) 0.006 % Reduction in Area: 0% % Reduction in Volume: -100% Epithelialization: None Wound Description Classification: Grade 2 Wound Margin: Well defined, not attached Exudate Amount: Small Exudate Type: Serosanguineous Exudate Color: red, brown Foul Odor After Cleansing: No Slough/Fibrino No Wound Bed Granulation Amount: Large (67-100%) Exposed Structure Granulation Quality: Red Fascia Exposed: No Necrotic Amount: None Present (0%) Fat Layer (Subcutaneous Tissue) Exposed: Yes Tendon Exposed: No Muscle Exposed: No Joint  Exposed: No Bone Exposed: No Electronic Signature(s) Signed: 08/29/2021 12:34:01 PM By: Baruch Gouty RN, BSN Signed: 09/03/2021 1:46:44 PM By: Sandre Kitty Entered By: Sandre Kitty on 08/29/2021 10:15:29 -------------------------------------------------------------------------------- [place] Details Patient Name: Date of Service: [name] [name], [name] 08/29/2021 9:30 A M Medical Record Number: [id number] Patient Account Number: [id number] Date of Birth/Sex: Treating RN: March 15, 1971 (50 y.o. [name] Primary Care Eriel Dunckel: [name] Other Clinician: Referring Dayden Viverette: Treating Lailanie Hasley/Extender: Delano Metz in Treatment: 71 Vital Signs Time Taken: 10:10 Temperature (F): 97.9 Height (in): 77 Pulse (bpm): 108 Weight (lbs): 232 Respiratory Rate (breaths/min): 17 Body Mass Index (BMI): 27.5 Blood Pressure (mmHg): 187/98 Capillary Blood Glucose (mg/dl): 420 Reference Range: 80 - 120 mg / dl Electronic Signature(s) Signed: 09/01/2021 4:58:30 PM By: Levan Hurst RN, BSN Entered By: Levan Hurst on 08/29/2021 10:56:19

## 2021-09-03 NOTE — Anesthesia Postprocedure Evaluation (Signed)
Anesthesia Post Note  Patient: MEARL OLVER  Procedure(s) Performed: ESOPHAGOGASTRODUODENOSCOPY (EGD) WITH PROPOFOL BIOPSY     Patient location during evaluation: PACU Anesthesia Type: MAC Level of consciousness: awake and alert Pain management: pain level controlled Vital Signs Assessment: post-procedure vital signs reviewed and stable Respiratory status: spontaneous breathing, nonlabored ventilation, respiratory function stable and patient connected to nasal cannula oxygen Cardiovascular status: stable and blood pressure returned to baseline Postop Assessment: no apparent nausea or vomiting Anesthetic complications: no   No notable events documented.  Last Vitals:  Vitals:   09/02/21 2232 09/03/21 0437  BP: (!) 173/96 (!) 150/94  Pulse: (!) 101 84  Resp: 18 18  Temp: 37 C 36.8 C  SpO2: 93% 97%    Last Pain:  Vitals:   09/03/21 0751  TempSrc:   PainSc: 0-No pain                 Lanise Mergen S

## 2021-09-03 NOTE — TOC Transition Note (Signed)
Transition of Care [facility]) - CM/SW Discharge Note   Patient Details  Name: AYVION KAVANAGH MRN: [id number] Date of Birth: Feb 12, 1971  [facility]) CM/SW Contact:  Tom-Johnson, Renea Ee, RN Phone Number: 09/03/2021, 10:58 AM   Clinical Narrative:    CM spoke with patient at bedside. From home, living with mother but mother is in rehab in [place] from a hip fracture. Presented to the ED with nausea and frequent episodes of vomiting mixed with dark red blood. Patient is not married and has no children. Both parents are alive and has one sister. All are supportive of his care. Patient is unemployed and disabled. Able to drive self to and from appointments. Has a cane, walker, manual wheelchair, and rt prosthetic leg. States he smokes a pack of cigarettes a day and declines any education. Patient is scheduled for discharge and states he will drive self as his car is out in the parking deck. CM educated him on safety and encouraged him to use a ride which will be provided for him but patient declined. Nurse notified. Denies any other needs. No further needs for TOC at this time.  Final next level of care: Home/Self Care Barriers to Discharge: No Barriers Identified   Patient Goals and CMS Choice Patient states their goals for this hospitalization and ongoing recovery are:: To go home      Discharge Placement                       Discharge Plan and Services                DME Arranged: N/A DME Agency: NA       HH Arranged: NA HH Agency: NA        Social Determinants of Health (SDOH) Interventions     Readmission Risk Interventions No flowsheet data found.

## 2021-09-03 NOTE — Plan of Care (Signed)
  Problem: Education: Goal: Knowledge of General Education information will improve Description: Including pain rating scale, medication(s)/side effects and non-pharmacologic comfort measures Outcome: Progressing   Problem: Clinical Measurements: Goal: Diagnostic test results will improve Outcome: Progressing   Problem: Activity: Goal: Risk for activity intolerance will decrease Outcome: Progressing   

## 2021-09-03 NOTE — Progress Notes (Signed)
DISCHARGE NOTE HOME [name] [name] to be discharged Home per MD order. Discussed prescriptions and follow up appointments with the patient. Prescriptions given to patient; medication list explained in detail. Patient verbalized understanding.  Skin clean, dry and intact without evidence of skin break down, no evidence of skin tears noted. IV catheter discontinued intact. Site without signs and symptoms of complications. Dressing and pressure applied. Pt denies pain at the site currently. No complaints noted.  Patient free of lines, drains, and wounds.   An After Visit Summary (AVS) was printed and given to the patient. Patient escorted via wheelchair, and discharged home via private auto.  Dolores Hoose, RN

## 2021-09-03 NOTE — Discharge Instructions (Signed)
Continue to take protonix twice per day and sucralfate 4 times per day to help reduce inflammation in esophagus and stomach.  Follow up with GI as scheduled.  Please see your primary doctor within a week to recheck your kidney function, your blood pressure, and your hemoglobin.

## 2021-09-03 NOTE — Discharge Summary (Signed)
Physician Discharge Summary  [name] [name] UXL:[id number] DOB: 25-Jul-1971 DOA: 09/01/2021  PCP: [name], NP  Admit date: 09/01/2021 Discharge date: 09/03/2021  Admitted From: home Disposition:  home  Recommendations for Outpatient Follow-up:  Follow up with PCP within 1-2 weeks for CBC follow up. BMP to monitor kidney function. Consider restarting lisinopril, metformin, farxiga when improved.  Follow up with GI for repeat ECG  Discharge Condition:stable CODE STATUS:full Diet recommendation: regular  Brief/Interim Summary: Pt presented with hematemesis which was seen to be from esophagitis on EGD. Treated intraoperatively and medically with sulcrafate and PPI which will be continued until GI follow up with repeat EGD in 8 weeks. Additionally, patient had AKI on chronic kidney disease likely due to hypovolemia and medications. His metformin, lisinopril, and farxiga have been held while admitted. Recommend checking labs and BP prior to restarting.   Discharge Diagnoses:  Principal Problem:   Acute renal failure superimposed on stage 3b chronic kidney disease ([place]) Active Problems:   Uncontrolled type 2 diabetes mellitus with hyperglycemia, with long-term current use of insulin (HCC)   Essential hypertension   GERD with esophagitis   Lactic acidosis   Hematemesis    Allergies as of 09/03/2021       Reactions   Adhesive [tape] Rash   Rash at site of tape--okay to use paper tape        Medication List     STOP taking these medications    dapagliflozin propanediol 10 MG Tabs tablet Commonly known as: FARXIGA   HumuLIN N KwikPen 100 UNIT/ML Kiwkpen Generic drug: Insulin NPH (Human) (Isophane)   lisinopril 20 MG tablet Commonly known as: ZESTRIL   metFORMIN 500 MG 24 hr tablet Commonly known as: GLUCOPHAGE-XR       TAKE these medications    AgaMatrix Presto w/Device Kit Check sugars twice daily before meals   AgaMatrix Ultra-Thin Lancets  Misc Check blood sugars before meals twice daily   amLODipine 10 MG tablet Commonly known as: NORVASC Take 10 mg by mouth daily.   ARIPiprazole 20 MG tablet Commonly known as: ABILIFY Take 20 mg by mouth daily.   atorvastatin 40 MG tablet Commonly known as: LIPITOR Take 40 mg by mouth daily.   baclofen 10 MG tablet Commonly known as: LIORESAL 1/2 tab twice a day What changed:  how much to take how to take this when to take this reasons to take this additional instructions   desvenlafaxine 100 MG 24 hr tablet Commonly known as: PRISTIQ Take 100 mg by mouth daily.   FLUoxetine 40 MG capsule Commonly known as: PROZAC Take 40 mg by mouth daily.   fluticasone 50 MCG/ACT nasal spray Commonly known as: FLONASE Place 2 sprays into both nostrils daily as needed for allergies or rhinitis.   gabapentin 300 MG capsule Commonly known as: NEURONTIN 1 cap by mouth in morning and midday, 2 caps by mouth at bedtime What changed:  how much to take how to take this when to take this additional instructions   glipiZIDE 5 MG tablet Commonly known as: GLUCOTROL 1/2 tab by mouth twice daily with meal What changed:  how much to take how to take this when to take this additional instructions   glucose blood test strip Commonly known as: AgaMatrix Presto Test Check blood sugars twice daily before meals   metoCLOPramide 5 MG tablet Commonly known as: Reglan Take 1 tablet (5 mg total) by mouth every 8 (eight) hours as needed for nausea or vomiting.   metoprolol  tartrate 25 MG tablet Commonly known as: LOPRESSOR Take 25 mg by mouth 2 (two) times daily.   pantoprazole 40 MG tablet Commonly known as: Protonix Take 1 tablet (40 mg total) by mouth 2 (two) times daily.   Pen Needles 30G X 5 MM Misc check blood sugar 4 times a day   sucralfate 1 GM/10ML suspension Commonly known as: CARAFATE Take 10 mLs (1 g total) by mouth 4 (four) times daily -  with meals and at bedtime.    Trulicity 1.5 YI/0.1KP Sopn Generic drug: Dulaglutide Inject 1.5 mg into the skin once a week. Friday's        Allergies  Allergen Reactions   Adhesive [Tape] Rash    Rash at site of tape--okay to use paper tape   Consultations: GI  Procedures/Studies: CT ABDOMEN PELVIS WO CONTRAST  Result Date: 09/02/2021 CLINICAL DATA:  Abdominal pain, fever EXAM: CT ABDOMEN AND PELVIS WITHOUT CONTRAST TECHNIQUE: Multidetector CT imaging of the abdomen and pelvis was performed following the standard protocol without IV contrast. COMPARISON:  None. FINDINGS: Lower chest: The visualized lung bases are clear. The visualized heart and pericardium are unremarkable. There is circumferential wall thickening involving the distal esophagus and paraesophageal inflammatory stranding in keeping with changes of esophagitis, possibly reflux esophagitis. Hepatobiliary: Cholelithiasis without pericholecystic inflammatory change noted. Liver unremarkable. No intra or extrahepatic biliary ductal dilation. Pancreas: Unremarkable Spleen: Unremarkable Adrenals/Urinary Tract: Nodular thickening of the left adrenal gland is unchanged. The right adrenal gland is unremarkable. The kidneys are normal. The bladder is unremarkable. Stomach/Bowel: Stomach is within normal limits. Appendix appears normal. No evidence of bowel wall thickening, distention, or inflammatory changes. No free intraperitoneal gas or fluid. Vascular/Lymphatic: Minimal atherosclerotic calcification within the abdominal aorta. No aortic aneurysm. No pathologic adenopathy within the abdomen and pelvis. Reproductive: Prostate is unremarkable. Other: Tiny bilateral fat containing inguinal hernias. Rectum unremarkable. Musculoskeletal: No acute bone abnormality. No lytic or blastic bone lesion. IMPRESSION: No acute intra-abdominal pathology identified. No definite radiographic explanation for the patient's reported symptoms. Circumferential wall thickening and  paraesophageal inflammatory stranding in keeping with esophagitis, possibly reflux esophagitis. This would be better assessed with endoscopy, if indicated. Cholelithiasis. Aortic Atherosclerosis (ICD10-I70.0). Electronically Signed   By: Fidela Salisbury M.D.   On: 09/02/2021 02:54   DG Chest 2 View  Result Date: 09/01/2021 CLINICAL DATA:  Fever, hyperglycemia, fatigue. EXAM: CHEST - 2 VIEW COMPARISON:  None. FINDINGS: Elevation of the left hemidiaphragm. Normal heart size and pulmonary vascularity. No focal airspace disease or consolidation in the lungs. No blunting of costophrenic angles. No pneumothorax. Mediastinal contours appear intact. Tortuous aorta. Degenerative changes in the spine. IMPRESSION: No active cardiopulmonary disease. Electronically Signed   By: Lucienne Capers M.D.   On: 09/01/2021 20:01    Subjective: Patient has no complaints or concerns at this time. We discussed his medications changes prior to discharge.   Discharge Exam: Vitals:   09/03/21 0437 09/03/21 0930  BP: (!) 150/94 (!) 153/83  Pulse: 84 (!) 101  Resp: 18 16  Temp: 98.3 F (36.8 C) 98.6 F (37 C)  SpO2: 97% 96%    General: Pt is alert, awake, not in acute distress Cardiovascular: RRR, S1/S2 +, no rubs, no gallops Respiratory: CTA bilaterally, no wheezing, no rhonchi Abdominal: Soft, NT, ND, bowel sounds + Extremities: no edema, no cyanosis, R amputation  Labs: Basic Metabolic Panel: Recent Labs  Lab 09/01/21 1956 09/02/21 0753  NA 137 136  K 3.4* 3.3*  CL 94* 99  CO2  30 26  GLUCOSE 61* 163*  BUN 39* 41*  CREATININE 3.61* 3.68*  CALCIUM 8.1* 7.6*  MG  --  1.7   CBC: Recent Labs  Lab 09/01/21 1956 09/02/21 0753 09/02/21 1233 09/02/21 1830 09/03/21 0837  WBC 12.8* 8.3 8.0 11.5* 7.3  NEUTROABS 8.6* 5.5  --   --   --   HGB 11.3* 9.8* 10.2* 10.4* 10.3*  HCT 36.4* 30.8* 32.4* 33.5* 32.3*  MCV 89.0 89.8 89.3 90.3 88.5  PLT 238 191 187 203 181    Microbiology Recent Results (from  the past 240 hour(s))  Resp Panel by RT-PCR (Flu A&B, Covid) Nasopharyngeal Swab     Status: None   Collection Time: 09/01/21  7:28 PM   Specimen: Nasopharyngeal Swab; Nasopharyngeal(NP) swabs in vial transport medium  Result Value Ref Range Status   SARS Coronavirus 2 by RT PCR NEGATIVE NEGATIVE Final    Comment: (NOTE) SARS-CoV-2 target nucleic acids are NOT DETECTED.  The SARS-CoV-2 RNA is generally detectable in upper respiratory specimens during the acute phase of infection. The lowest concentration of SARS-CoV-2 viral copies this assay can detect is 138 copies/mL. A negative result does not preclude SARS-Cov-2 infection and should not be used as the sole basis for treatment or other patient management decisions. A negative result may occur with  improper specimen collection/handling, submission of specimen other than nasopharyngeal swab, presence of viral mutation(s) within the areas targeted by this assay, and inadequate number of viral copies(<138 copies/mL). A negative result must be combined with clinical observations, patient history, and epidemiological information. The expected result is Negative.  Fact Sheet for Patients:  EntrepreneurPulse.com.au  Fact Sheet for Healthcare Providers:  IncredibleEmployment.be  This test is no t yet approved or cleared by the [place] FDA and  has been authorized for detection and/or diagnosis of SARS-CoV-2 by FDA under an Emergency Use Authorization (EUA). This EUA will remain  in effect (meaning this test can be used) for the duration of the COVID-19 declaration under Section 564(b)(1) of the Act, 21 U.S.C.section 360bbb-3(b)(1), unless the authorization is terminated  or revoked sooner.       Influenza A by PCR NEGATIVE NEGATIVE Final   Influenza B by PCR NEGATIVE NEGATIVE Final    Comment: (NOTE) The Xpert Xpress SARS-CoV-2/FLU/RSV plus assay is intended as an aid in the diagnosis of  influenza from Nasopharyngeal swab specimens and should not be used as a sole basis for treatment. Nasal washings and aspirates are unacceptable for Xpert Xpress SARS-CoV-2/FLU/RSV testing.  Fact Sheet for Patients: EntrepreneurPulse.com.au  Fact Sheet for Healthcare Providers: IncredibleEmployment.be  This test is not yet approved or cleared by the [place] FDA and has been authorized for detection and/or diagnosis of SARS-CoV-2 by FDA under an Emergency Use Authorization (EUA). This EUA will remain in effect (meaning this test can be used) for the duration of the COVID-19 declaration under Section 564(b)(1) of the Act, 21 U.S.C. section 360bbb-3(b)(1), unless the authorization is terminated or revoked.  Performed at [facility], [place] [address] [place], [place] [address]   Group A Strep by PCR     Status: None   Collection Time: 09/02/21 12:04 AM   Specimen: Sterile Swab  Result Value Ref Range Status   Group A Strep by PCR NOT DETECTED NOT DETECTED Final    Comment: Performed at [facility], [place] [address] [place], [place] [address]  Blood culture (routine x 2)     Status: None (Preliminary result)  Collection Time: 09/02/21 12:09 AM   Specimen: BLOOD  Result Value Ref Range Status   Specimen Description BLOOD SITE NOT SPECIFIED  Final   Special Requests   Final    BOTTLES DRAWN AEROBIC AND ANAEROBIC Blood Culture results may not be optimal due to an inadequate volume of blood received in culture bottles   Culture   Final    NO GROWTH 1 DAY Performed at [facility], [address] [place], [place] [address]    Report Status PENDING  Incomplete  Blood culture (routine x 2)     Status: None (Preliminary result)   Collection Time: 09/02/21 12:20 AM   Specimen: BLOOD  Result Value Ref Range Status   Specimen Description BLOOD LEFT ANTECUBITAL  Final   Special Requests   Final    BOTTLES DRAWN AEROBIC AND  ANAEROBIC Blood Culture adequate volume   Culture   Final    NO GROWTH 1 DAY Performed at [facility], [place] [address] [place], [place] [address]    Report Status PENDING  Incomplete    Time coordinating discharge: Over 30 minutes  Richarda Osmond, MD  [facility] 09/03/2021, 12:20 PM Pager   If 7PM-7AM, please contact night-coverage [email or web address] Password [email or web address]

## 2021-09-03 NOTE — Progress Notes (Signed)
Discharge instructions (including medications) discussed with and copy provided to patient/caregiver 

## 2021-09-03 NOTE — Plan of Care (Signed)
  Problem: Education: Goal: Knowledge of General Education information will improve Description: Including pain rating scale, medication(s)/side effects and non-pharmacologic comfort measures Outcome: Adequate for Discharge   

## 2021-09-04 LAB — SURGICAL PATHOLOGY

## 2021-09-04 NOTE — Progress Notes (Signed)
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 These infections usually resolve on their own, but the healing can be aided by treatment with acyclovir.   If he is still having symptoms, please send him a prescription for acyclovir 200 mg TID for 7 days (dosage has been renally adjusted). Thanks.

## 2021-09-05 ENCOUNTER — Other Ambulatory Visit: Payer: Self-pay

## 2021-09-05 DIAGNOSIS — R112 Nausea with vomiting, unspecified: Secondary | ICD-10-CM

## 2021-09-05 DIAGNOSIS — B009 Herpesviral infection, unspecified: Secondary | ICD-10-CM

## 2021-09-05 MED ORDER — ACYCLOVIR 400 MG PO TABS: 200.0000 mg | ORAL_TABLET | Freq: Three times a day (TID) | ORAL | 0 refills | Status: AC

## 2021-09-05 MED ORDER — ONDANSETRON HCL 4 MG PO TABS: 4.0000 mg | ORAL_TABLET | Freq: Three times a day (TID) | ORAL | 0 refills | Status: DC | PRN

## 2021-09-07 LAB — CULTURE, BLOOD (ROUTINE X 2)
Culture: NO GROWTH
Culture: NO GROWTH
Special Requests: ADEQUATE

## 2021-09-09 ENCOUNTER — Encounter: Payer: Self-pay | Admitting: Gastroenterology

## 2021-09-09 ENCOUNTER — Ambulatory Visit (INDEPENDENT_AMBULATORY_CARE_PROVIDER_SITE_OTHER): Payer: Medicare Other | Admitting: Gastroenterology

## 2021-09-09 VITALS — BP 121/76 | HR 105 | Ht 77.0 in | Wt 231.5 lb

## 2021-09-09 DIAGNOSIS — K2101 Gastro-esophageal reflux disease with esophagitis, with bleeding: Secondary | ICD-10-CM

## 2021-09-09 DIAGNOSIS — R112 Nausea with vomiting, unspecified: Secondary | ICD-10-CM

## 2021-09-09 DIAGNOSIS — R1310 Dysphagia, unspecified: Secondary | ICD-10-CM

## 2021-09-09 MED ORDER — METOCLOPRAMIDE HCL 10 MG PO TABS: 10.0000 mg | ORAL_TABLET | Freq: Three times a day (TID) | ORAL | 1 refills | Status: DC

## 2021-09-09 MED ORDER — LIDOCAINE VISCOUS HCL 2 % MT SOLN: 15.0000 mL | Freq: Three times a day (TID) | OROMUCOSAL | 1 refills | Status: DC | PRN

## 2021-09-09 MED ORDER — METOCLOPRAMIDE HCL 10 MG PO TABS: 10.0000 mg | ORAL_TABLET | Freq: Two times a day (BID) | ORAL | 1 refills | Status: DC

## 2021-09-09 NOTE — Progress Notes (Signed)
[facility] Progress Note  Chief Complaint: Nausea vomiting and odynophagia  Subjective  History: [name] [name] was last seen in the office in early September for persistent vomiting felt likely to be gastric dysrhythmia related to his advanced diabetes.  He was on a combination of ondansetron and metoclopramide, some changes to that regimen were made at the last visit.  Hemoglobin A1c running about 8.0-8.5. He was recently admitted to the hospital with a febrile illness associated with worsened nausea and protracted vomiting leading to acute kidney injury from volume depletion.  The vomiting caused hematemesis, and led to upper endoscopy by Dr. Lorenso Courier revealed severe esophagitis accounting for the hematemesis.  No active bleeding.  There is also a large amount of retained fluid in the stomach.  Patient was treated with twice daily high-dose PPI and Carafate with recommendation to repeat upper endoscopy in 8 weeks to assess healing.  [name] [name] has been taking his medications as recommended but still has odynophagia since hospital discharge.  It has affected his oral intake some, but he believes he is staying hydrated.  As before he has continued nausea with intermittent vomiting.  It is now settled down to his prehospital level, similar to when I last saw him in the office.  ROS: Cardiovascular:  no chest pain Respiratory: no dyspnea Depressed mood The patient's Past Medical, Family and Social History were reviewed and are on file in the EMR.  Objective:  Med list reviewed  Current Outpatient Medications:    acyclovir (ZOVIRAX) 400 MG tablet, Take 0.5 tablets (200 mg total) by mouth 3 (three) times daily for 7 days., Disp: 10.5 tablet, Rfl: 0   AGAMATRIX ULTRA-THIN LANCETS MISC, Check blood sugars before meals twice daily, Disp: 100 each, Rfl: 11   amLODipine (NORVASC) 10 MG tablet, Take 10 mg by mouth daily., Disp: , Rfl:    ARIPiprazole (ABILIFY) 20 MG tablet, Take 20 mg by mouth daily.,  Disp: , Rfl:    atorvastatin (LIPITOR) 40 MG tablet, Take 40 mg by mouth daily., Disp: , Rfl:    baclofen (LIORESAL) 10 MG tablet, 1/2 tab twice a day (Patient taking differently: Take 20 mg by mouth 2 (two) times daily as needed for muscle spasms.), Disp: 30 each, Rfl: 6   desvenlafaxine (PRISTIQ) 100 MG 24 hr tablet, Take 100 mg by mouth daily., Disp: , Rfl:    FARXIGA 10 MG TABS tablet, Take 10 mg by mouth daily., Disp: , Rfl:    FLUoxetine (PROZAC) 40 MG capsule, Take 40 mg by mouth daily., Disp: , Rfl:    fluticasone (FLONASE) 50 MCG/ACT nasal spray, Place 2 sprays into both nostrils daily as needed for allergies or rhinitis. , Disp: , Rfl:    gabapentin (NEURONTIN) 300 MG capsule, 1 cap by mouth in morning and midday, 2 caps by mouth at bedtime (Patient taking differently: Take 300-600 mg by mouth See admin instructions. Take one capsule (300 mg) by mouth in the morning and afternoon, take two capsules (600 mg) at bedtime), Disp: 120 capsule, Rfl: 11   GI Cocktail (alum & mag hydroxide-simethicone/lidocaine)oral mixture, Take 15 mLs by mouth 3 (three) times daily as needed., Disp: 180 mL, Rfl: 1   glipiZIDE (GLUCOTROL) 5 MG tablet, 1/2 tab by mouth twice daily with meal (Patient taking differently: Take 5 mg by mouth daily with supper.), Disp: 15 tablet, Rfl: 11   glucose blood (AGAMATRIX PRESTO TEST) test strip, Check blood sugars twice daily before meals, Disp: 100 each, Rfl: 12  glucose blood test strip, 1 each by Other route as needed for other. Use as instructed, Disp: , Rfl:    metoprolol tartrate (LOPRESSOR) 25 MG tablet, Take 25 mg by mouth 2 (two) times daily., Disp: , Rfl:    ondansetron (ZOFRAN) 4 MG tablet, Take 1 tablet (4 mg total) by mouth 3 (three) times daily as needed for nausea or vomiting., Disp: 30 tablet, Rfl: 0   pantoprazole (PROTONIX) 40 MG tablet, Take 1 tablet (40 mg total) by mouth 2 (two) times daily., Disp: 60 tablet, Rfl: 2   sucralfate (CARAFATE) 1 GM/10ML  suspension, Take 10 mLs (1 g total) by mouth 4 (four) times daily -  with meals and at bedtime., Disp: 420 mL, Rfl: 0   TRULICITY 1.5 BL/3.9QZ SOPN, Inject 1.5 mg into the skin once a week. Friday's, Disp: , Rfl:    metoCLOPramide (REGLAN) 10 MG tablet, Take 1 tablet (10 mg total) by mouth 2 (two) times daily., Disp: 60 tablet, Rfl: 1   Vital signs in last 24 hrs: Vitals:   09/09/21 1448  BP: 121/76  Pulse: (!) 105   Wt Readings from Last 3 Encounters:  09/09/21 231 lb 8 oz (105 kg)  09/02/21 216 lb 11.4 oz (98.3 kg)  07/30/21 234 lb (106.1 kg)    Physical Exam  Restricted affect, normal vocal quality HEENT: sclera anicteric, oral mucosa moist without lesions Neck: supple, no thyromegaly, JVD or lymphadenopathy Cardiac: RRR without murmurs, S1S2 heard, no peripheral edema Pulm: clear to auscultation bilaterally, normal RR and effort noted Abdomen: soft, no tenderness, with active bowel sounds. No guarding or palpable hepatosplenomegaly. Skin; warm and dry, no jaundice or rash Right BKA with orthotic Labs:  CBC Latest Ref Rng & Units 09/03/2021 09/02/2021 09/02/2021  WBC 4.0 - 10.5 K/uL 7.3 11.5(H) 8.0  Hemoglobin 13.0 - 17.0 g/dL 10.3(L) 10.4(L) 10.2(L)  Hematocrit 39.0 - 52.0 % 32.3(L) 33.5(L) 32.4(L)  Platelets 150 - 400 K/uL 181 203 187   CMP Latest Ref Rng & Units 09/02/2021 09/01/2021 04/23/2021  Glucose 70 - 99 mg/dL 163(H) 61(L) 254(H)  BUN 6 - 20 mg/dL 41(H) 39(H) 40(H)  Creatinine 0.61 - 1.24 mg/dL 3.68(H) 3.61(H) 2.25(H)  Sodium 135 - 145 mmol/L 136 137 144  Potassium 3.5 - 5.1 mmol/L 3.3(L) 3.4(L) 4.2  Chloride 98 - 111 mmol/L 99 94(L) 105  CO2 22 - 32 mmol/L 26 30 27   Calcium 8.9 - 10.3 mg/dL 7.6(L) 8.1(L) 8.8(L)  Total Protein 6.5 - 8.1 g/dL 5.5(L) 6.5 7.3  Total Bilirubin 0.3 - 1.2 mg/dL 0.8 0.7 0.6  Alkaline Phos 38 - 126 U/L 101 112 134(H)  AST 15 - 41 U/L 11(L) 13(L) 15  ALT 0 - 44 U/L 9 11 12     ___________________________________________ Radiologic  studies:  EGD report reviewed, and case discussed with Dr. Lorenso Courier last week. ____________________________________________ Other:   _____________________________________________ Assessment & Plan  Assessment: Encounter Diagnoses  Name Primary?   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  While it may not be gastroparesis, at least not during the time of his GES, other problems of impaired fundic relaxation, impaired antroduodenal contraction or pyloric spasm may be the cause. The severe esophagitis on endoscopy is the effect rather than because of the  recent upper GI symptoms.  And that vomiting illness appears to have been triggered by something infectious in a patient who had persistent fever for days.  Plan: GI cocktail prescribed to use up to 3 times a day as needed, taking the Carafate about an hour afterwards.  I suspect he will need this for the next 7 to 10 days while the esophagitis improves.  Twice daily PPI for the next 6-week  At this juncture, he probably does not need a repeat upper endoscopy as this esophagitis occurred from an acute infectious illness with protracted vomiting and is expected to heal on current therapy.  I will maximize his metoclopramide by putting him on 5 mg 3 times daily consistently, which is the renal adjusted dose for his CKD.  If we cannot keep his symptoms under control with that and as needed Zofran, he may need evaluation at the [facility].  Return to office in 6 weeks, call sooner as needed  32 minutes were spent on this encounter (including chart review related to recent hospitalization, history/exam, counseling/coordination of care, and documentation) > 50% of that time was  spent on counseling and coordination of care.   [name] [name]

## 2021-09-09 NOTE — Patient Instructions (Signed)
If you are age 48 or older, your body mass index should be between 23-30. Your Body mass index is 27.45 kg/m. If this is out of the aforementioned range listed, please consider follow up with your Primary Care Provider.  If you are age 61 or younger, your body mass index should be between 19-25. Your Body mass index is 27.45 kg/m. If this is out of the aformentioned range listed, please consider follow up with your Primary Care Provider.   ________________________________________________________  The [facility] providers would like to encourage you to use [facility] to communicate with providers for non-urgent requests or questions.  Due to long hold times on the telephone, sending your provider a message by [facility] may be a faster and more efficient way to get a response.  Please allow 48 business hours for a response.  Please remember that this is for non-urgent requests.   _______________________________________________________  It was a pleasure to see you today!  Thank you for trusting me with your gastrointestinal care!

## 2021-09-10 ENCOUNTER — Telehealth: Payer: Self-pay | Admitting: Gastroenterology

## 2021-09-10 NOTE — Telephone Encounter (Signed)
Inbound call from pharmacy requesting a call back stating that needed the directions again for the GI cocktail. They made a mistake a deleted the directions. Please advise. Thank you.

## 2021-09-11 ENCOUNTER — Other Ambulatory Visit: Payer: Self-pay

## 2021-09-11 ENCOUNTER — Encounter (HOSPITAL_BASED_OUTPATIENT_CLINIC_OR_DEPARTMENT_OTHER): Payer: Medicare Other | Admitting: Internal Medicine

## 2021-09-11 DIAGNOSIS — S81801A Unspecified open wound, right lower leg, initial encounter: Secondary | ICD-10-CM | POA: Diagnosis not present

## 2021-09-11 DIAGNOSIS — E11621 Type 2 diabetes mellitus with foot ulcer: Secondary | ICD-10-CM

## 2021-09-11 DIAGNOSIS — L97522 Non-pressure chronic ulcer of other part of left foot with fat layer exposed: Secondary | ICD-10-CM

## 2021-09-11 DIAGNOSIS — X58XXXA Exposure to other specified factors, initial encounter: Secondary | ICD-10-CM | POA: Diagnosis not present

## 2021-09-11 DIAGNOSIS — F17200 Nicotine dependence, unspecified, uncomplicated: Secondary | ICD-10-CM | POA: Diagnosis not present

## 2021-09-11 DIAGNOSIS — Z89511 Acquired absence of right leg below knee: Secondary | ICD-10-CM | POA: Diagnosis not present

## 2021-09-11 DIAGNOSIS — E1142 Type 2 diabetes mellitus with diabetic polyneuropathy: Secondary | ICD-10-CM | POA: Diagnosis not present

## 2021-09-11 MED ORDER — LIDOCAINE VISCOUS HCL 2 % MT SOLN: 15.0000 mL | Freq: Three times a day (TID) | OROMUCOSAL | 1 refills | Status: DC | PRN

## 2021-09-11 NOTE — Telephone Encounter (Signed)
Yes, you can resend the order on file, which is 53ml by mouth three times daily as needed.  Disp 134ml  - HD

## 2021-09-11 NOTE — Telephone Encounter (Signed)
Can you please verify the Rx for the GI cocktail. Can I just resend the order on file?

## 2021-09-11 NOTE — Telephone Encounter (Signed)
Rx has been sent as requested to [facility]

## 2021-09-16 NOTE — Progress Notes (Signed)
[name] [name] ([id number]) Visit Report for 09/11/2021 Arrival Information Details Patient Name: Date of Service: [name] [name] 09/11/2021 12:45 PM Medical Record Number: [id number] Patient Account Number: [id number] Date of Birth/Sex: Treating RN: 04-05-1971 (50 y.o. [name] [name] Primary Care [name] [name]: [name] [name] Other Clinician: Referring [name] [name]: Treating [name] [name]/Extender: [name] [name] in Treatment: 67 Visit Information History Since Last Visit Added or deleted any medications: No Patient Arrived: Ambulatory Any new allergies or adverse reactions: No Arrival Time: 13:01 Had a fall or experienced change in No Accompanied By: self activities of daily living that may affect Transfer Assistance: None risk of falls: Patient Identification Verified: Yes Signs or symptoms of abuse/neglect since last visito No Secondary Verification Process Completed: Yes Hospitalized since last visit: No Patient Requires Transmission-Based Precautions: No Implantable device outside of the clinic excluding No Patient Has Alerts: No cellular tissue based products placed in the center since last visit: Has Dressing in Place as Prescribed: Yes Pain Present Now: Yes Electronic Signature(s) Signed: 09/16/2021 2:37:05 PM By: [name] [name] Entered By: [name] [name] on 09/11/2021 13:01:28 -------------------------------------------------------------------------------- Encounter Discharge Information Details Patient Name: Date of Service: [name] [name] ND, [name] 09/11/2021 12:45 PM Medical Record Number: [id number] Patient Account Number: [id number] Date of Birth/Sex: Treating RN: December 20, 1970 (50 y.o. [name] [name] Primary Care Raven Furnas: [name] [name] Other Clinician: Referring Evin Chirco: Treating Taneil Lazarus/Extender: [name] [name] in Treatment: 5 Encounter Discharge Information Items Post Procedure  Vitals Discharge Condition: Stable Temperature (F): 98.7 Ambulatory Status: Ambulatory Pulse (bpm): 74 Discharge Destination: Home Respiratory Rate (breaths/min): 17 Transportation: Private Auto Blood Pressure (mmHg): 133/70 Accompanied By: self Schedule Follow-up Appointment: Yes Clinical Summary of Care: Patient Declined Electronic Signature(s) Signed: 09/16/2021 5:23:14 PM By: [name] Hammock RN Entered By: [name] [name] on 09/11/2021 14:03:27 -------------------------------------------------------------------------------- Lower Extremity Assessment Details Patient Name: Date of Service: [name] ND, [name] 09/11/2021 12:45 PM Medical Record Number: [id number] Patient Account Number: [id number] Date of Birth/Sex: Treating RN: [birth year] (50 y.o. [name] [name] Primary Care Azani Brogdon: [name] [name] Other Clinician: Referring Maxie Slovacek: Treating Tristram Milian/Extender: [name] [name] in Treatment: 55 Edema Assessment Assessed: [name] [name]: Yes] [name] [name]: No] Edema: [Left: N] [Right: o] Calf Left: Right: Point of Measurement: 50 cm From Medial Instep 39.8 cm Ankle Left: Right: Point of Measurement: 11 cm From Medial Instep 21.2 cm Vascular Assessment Pulses: Dorsalis Pedis Palpable: [Left:Yes] Posterior Tibial Palpable: [Left:Yes] Electronic Signature(s) Signed: 09/16/2021 5:23:14 PM By: [name] Hammock RN Entered By: [name] [name] on 09/11/2021 13:28:56 -------------------------------------------------------------------------------- Multi Wound Chart Details Patient Name: Date of Service: [name] [name] ND, [name] 09/11/2021 12:45 PM Medical Record Number: [id number] Patient Account Number: [id number] Date of Birth/Sex: Treating RN: [birth year] (50 y.o. [name] [name] Primary Care Charolette Bultman: [name] [name] Other Clinician: Referring [name] [name]: Treating [name] [name]/Extender: [name] [name] in Treatment:  21 Vital Signs Height(in): 77 Pulse(bpm): 93 Weight(lbs): 232 Blood Pressure(mmHg): 131/84 Body Mass Index(BMI): 28 Temperature(F): 98.4 Respiratory Rate(breaths/min): 18 Photos: [2R:Left Metatarsal head first] [N/A:N/A N/A] Wound Location: [2R:Blister] [N/A:N/A] Wounding Event: [2R:Diabetic Wound/Ulcer of the Lower] [N/A:N/A] Primary Etiology: [2R:Extremity Anemia, Hypertension, Peripheral] [N/A:N/A] Comorbid History: [2R:Venous Disease, Type II Diabetes 06/23/2020] [N/A:N/A] Date Acquired: [2R:55] [N/A:N/A] Weeks of Treatment: [2R:Open] [N/A:N/A] Wound Status: [2R:Yes] [N/A:N/A] Wound Recurrence: [2R:0.4x0.2x0.3] [N/A:N/A] Measurements L x W x D (cm) [2R:0.063] [N/A:N/A] A (cm) : rea [6I:6.803] [N/A:N/A] Volume (cm) : [2R:-103.20%] [N/A:N/A] % Reduction in A [2R:rea: -533.30%] [N/A:N/A] % Reduction in Volume: [2R:Grade 2] [N/A:N/A] Classification: [2R:Small] [N/A:N/A] Exudate A mount: [  2R:Serosanguineous] [N/A:N/A] Exudate Type: [2R:red, brown] [N/A:N/A] Exudate Color: [2R:Well defined, not attached] [N/A:N/A] Wound Margin: [2R:Large (67-100%)] [N/A:N/A] Granulation A mount: [2R:Red] [N/A:N/A] Granulation Quality: [2R:None Present (0%)] [N/A:N/A] Necrotic A mount: [2R:Fat Layer (Subcutaneous Tissue): Yes N/A] Exposed Structures: [2R:Fascia: No Tendon: No Muscle: No Joint: No Bone: No None] [N/A:N/A] Epithelialization: [2R:Debridement - Excisional] [N/A:N/A] Debridement: Pre-procedure Verification/Time Out 13:38 [N/A:N/A] Taken: [2R:Lidocaine] [N/A:N/A] Pain Control: [2R:Callus, Subcutaneous] [N/A:N/A] Tissue Debrided: [2R:Skin/Subcutaneous Tissue] [N/A:N/A] Level: [2R:0.08] [N/A:N/A] Debridement A (sq cm): [2R:rea Curette] [N/A:N/A] Instrument: [2R:Minimum] [N/A:N/A] Bleeding: [2R:Pressure] [N/A:N/A] Hemostasis A chieved: [2R:0] [N/A:N/A] Procedural Pain: [2R:0] [N/A:N/A] Post Procedural Pain: [2R:Procedure was tolerated well] [N/A:N/A] Debridement Treatment  Response: [2R:0.4x0.2x0.3] [N/A:N/A] Post Debridement Measurements L x W x D (cm) [1L:2.440] [N/A:N/A] Post Debridement Volume: (cm) [2R:Debridement] [N/A:N/A] Treatment Notes Electronic Signature(s) Signed: 09/11/2021 2:03:55 PM By: Kalman Shan DO Signed: 09/16/2021 5:23:14 PM By: [name] Hammock RN Entered By: Kalman Shan on 09/11/2021 13:48:10 -------------------------------------------------------------------------------- Multi-Disciplinary Care Plan Details Patient Name: Date of Service: [name] [name] ND, [name] 09/11/2021 12:45 PM Medical Record Number: [id number] Patient Account Number: [id number] Date of Birth/Sex: Treating RN: 12-Apr-1971 (50 y.o. [name] [name] Primary Care Robson Trickey: [name] [name] Other Clinician: Referring Karin [name]: Treating Kayti Poss/Extender: [name] [name] in Treatment: 65 Multidisciplinary Care Plan reviewed with physician Active Inactive Wound/Skin Impairment Nursing Diagnoses: Impaired tissue integrity Goals: Patient/caregiver will verbalize understanding of skin care regimen Date Initiated: 11/19/2020 Target Resolution Date: 09/20/2021 Goal Status: Active Ulcer/skin breakdown will have a volume reduction of 30% by week 4 Date Initiated: 08/16/2020 Date Inactivated: 03/03/2021 Target Resolution Date: 03/10/2021 Goal Status: Met Interventions: Provide education on ulcer and skin care Notes: 07/24/21: Wound care regimen ongoing. Electronic Signature(s) Signed: 09/16/2021 5:23:14 PM By: [name] Hammock RN Entered By: [name] [name] on 09/11/2021 13:44:05 -------------------------------------------------------------------------------- Pain Assessment Details Patient Name: Date of Service: Andree Elk, [name] 09/11/2021 12:45 PM Medical Record Number: [id number] Patient Account Number: [id number] Date of Birth/Sex: Treating RN: 1971-06-15 (50 y.o. [name] Primary Care Lyndal Alamillo:  [name] [name] Other Clinician: Referring Rashana Andrew: Treating Geovana Gebel/Extender: [name] [name] in Treatment: 17 Active Problems Location of Pain Severity and Description of Pain Patient Has Paino Yes Site Locations Rate the pain. Current Pain Level: 3 Pain Management and Medication Current Pain Management: Electronic Signature(s) Signed: 09/16/2021 2:37:05 PM By: [name] [name] Signed: 09/16/2021 5:23:14 PM By: [name] Hammock RN Entered By: [name] [name] on 09/11/2021 13:01:52 -------------------------------------------------------------------------------- Patient/Caregiver Education Details Patient Name: Date of Service: [name] [name] 10/20/2022andnbsp12:45 PM Medical Record Number: [id number] Patient Account Number: [id number] Date of Birth/Gender: Treating RN: 01-Sep-1971 (50 y.o. [name] Primary Care Physician: [name] [name] Other Clinician: Referring Physician: Treating Physician/Extender: [name] [name] in Treatment: 20 Education Assessment Education Provided To: Patient Education Topics Provided Wound/Skin Impairment: Methods: Explain/Verbal Responses: Reinforcements needed, State content correctly [employer]) Signed: 09/16/2021 5:23:14 PM By: [name] Hammock RN Entered By: [name] [name] on 09/11/2021 13:44:21 -------------------------------------------------------------------------------- Wound Assessment Details Patient Name: Date of Service: [name] [name] ND, [name] 09/11/2021 12:45 PM Medical Record Number: [id number] Patient Account Number: [id number] Date of Birth/Sex: Treating RN: May 05, 1971 (50 y.o. 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 Etiology: Wound Location: Left Metatarsal head first Wound Status: Open Wounding Event: Blister Comorbid Anemia, Hypertension, Peripheral Venous Disease, Type II Date Acquired: 06/23/2020 History: Diabetes  Weeks Of Treatment: 55 Clustered Wound: No Photos Wound Measurements Length: (cm) 0.4 Width: (cm) 0.2 Depth: (cm) 0.3 Area: (cm) 0.063 Volume: (cm) 0.019 % Reduction in Area: -103.2% % Reduction in Volume: -533.3% Epithelialization: None Wound Description Classification: Grade 2 Wound Margin: Well defined, not attached Exudate Amount: Small Exudate Type: Serosanguineous Exudate Color: red, brown Foul Odor After Cleansing: No Slough/Fibrino No Wound Bed Granulation Amount: Large (67-100%) Exposed Structure Granulation Quality: Red Fascia Exposed: No Necrotic Amount: None Present (0%) Fat Layer (Subcutaneous Tissue) Exposed: Yes Tendon Exposed: No Muscle Exposed: No Joint Exposed: No Bone Exposed: No Treatment Notes Wound #2R (Metatarsal head first) Wound Laterality: Left Cleanser Soap and Water Discharge Instruction: May shower and wash wound with dial antibacterial soap and water prior to dressing change. Wound Cleanser Discharge Instruction: Cleanse the wound with wound cleanser prior to applying a clean dressing using gauze sponges, not tissue or cotton balls. Peri-Wound Care Sween Lotion (Moisturizing lotion) Discharge Instruction: Apply moisturizing lotion to leg Topical Primary Dressing Secondary Dressing Woven Gauze Sponges 2x2 in Discharge Instruction: Apply over primary dressing as directed. Optifoam Non-Adhesive Dressing, 4x4 in Discharge Instruction: cut to make foam donut to help offload ADAPTIC TOUCH 3x4.25 in Discharge Instruction: Apply over primary dressing as directed. Secured With Conforming Stretch Gauze Bandage, Sterile 2x75 (in/in) Discharge Instruction: Secure with stretch gauze as directed. 44M Medipore H Soft Cloth Surgical  T ape, 2x2 (in/yd) Discharge Instruction: Secure dressing with tape as directed. Compression Wrap Compression Stockings Add-Ons Electronic Signature(s) Signed: 09/16/2021 2:37:05 PM By: [name] [name] Signed: 09/16/2021 5:23:14 PM By: [name] Hammock RN Entered By: [name] [name] on 09/11/2021 13:06:51 -------------------------------------------------------------------------------- Vitals Details Patient Name: Date of Service: [name] ND, [name] 09/11/2021 12:45 PM Medical Record Number: [id number] Patient Account Number: [id number] Date of Birth/Sex: Treating RN: [birth year] (51 y.o. [name] [name] Primary Care Marqus Macphee: [name] [name] Other Clinician: Referring Kiowa Peifer: Treating Brigett Estell/Extender: [name] [name] in Treatment: 61 Vital Signs Time Taken: 13:01 Temperature (F): 98.4 Height (in): 77 Pulse (bpm): 93 Weight (lbs): 232 Respiratory Rate (breaths/min): 18 Body Mass Index (BMI): 27.5 Blood Pressure (mmHg): 131/84 Reference Range: 80 - 120 mg / dl Electronic Signature(s) Signed: 09/16/2021 2:37:05 PM By: [name] [name] Entered By: [name] [name] on 09/11/2021 13:01:43

## 2021-09-16 NOTE — Progress Notes (Signed)
NICOLAOS, MITRANO ([id number]) Visit Report for 09/11/2021 Chief Complaint Document Details Patient Name: Date of Service: [name] [name] 09/11/2021 12:45 PM Medical Record Number: [id number] Patient Account Number: [id number] Date of Birth/Sex: Treating RN: Apr 03, 1971 (50 y.o. [name], [name] Primary Care Provider: [name] Other Clinician: Referring Provider: Treating Provider/Extender: Delano Metz in Treatment: 27 Information Obtained from: Patient Chief Complaint patient is here for a review of the wound on his plantar left first metatarsal head and back of his right leg Electronic Signature(s) Signed: 09/11/2021 2:03:55 PM By: Kalman Shan DO Entered By: Kalman Shan on 09/11/2021 13:48:20 -------------------------------------------------------------------------------- Cellular or Tissue Based Product Details Patient Name: Date of Service: [name] [name], [name] 09/11/2021 12:45 PM Medical Record Number: [id number] Patient Account Number: [id number] Date of Birth/Sex: Treating RN: [birth year] (50 y.o. [name], [name] Primary Care Provider: [name] Other Clinician: Referring Provider: Treating Provider/Extender: Delano Metz in Treatment: 42 Cellular or Tissue Based Product Type Wound #2R Left Metatarsal head first Applied to: Performed By: Physician Kalman Shan, DO Cellular or Tissue Based Product Type: Apligraf Level of Consciousness (Pre-procedure): Awake and Alert Pre-procedure Verification/Time Out Yes - 12:45 Taken: Location: genitalia / hands / feet / multiple digits Wound Size (sq cm): 0.08 Product Size (sq cm): 44 Waste Size (sq cm): 40 Waste Reason: wound size Amount of Product Applied (sq cm): 4 Instrument Used: Forceps, Scissors Lot #: [id number] Expiration Date: 09/12/2021 Fenestrated: No Reconstituted: Yes Solution Type: NS Solution Amount: 5 Lot  #: [id number] Solution Expiration Date: 07/24/2022 Secured: Yes Secured With: Steri-Strips Dressing Applied: Yes Primary Dressing: ADAPTICS Procedural Pain: 0 Post Procedural Pain: 0 Response to Treatment: Procedure was tolerated well Level of Consciousness (Post- Awake and Alert procedure): Post Procedure Diagnosis Same as Pre-procedure Electronic Signature(s) Signed: 09/15/2021 4:59:41 PM By: Kalman Shan DO Signed: 09/16/2021 5:23:14 PM By: Rhae Hammock RN Entered By: Rhae Hammock on 09/11/2021 17:22:51 -------------------------------------------------------------------------------- Debridement Details Patient Name: Date of Service: [name] [name], [name] 09/11/2021 12:45 PM Medical Record Number: [id number] Patient Account Number: [id number] Date of Birth/Sex: Treating RN: 09-02-1971 (50 y.o. [name], [name] Primary Care Provider: [name] Other Clinician: Referring Provider: Treating Provider/Extender: Delano Metz in Treatment: 55 Debridement Performed for Assessment: Wound #2R Left Metatarsal head first Performed By: Physician Kalman Shan, DO Debridement Type: Debridement Severity of Tissue Pre Debridement: Fat layer exposed Level of Consciousness (Pre-procedure): Awake and Alert Pre-procedure Verification/Time Out Yes - 13:38 Taken: Start Time: 13:38 Pain Control: Lidocaine T Area Debrided (L x W): otal 0.4 (cm) x 0.2 (cm) = 0.08 (cm) Tissue and other material debrided: Viable, Non-Viable, Callus, Subcutaneous, Skin: Dermis , Skin: Epidermis Level: Skin/Subcutaneous Tissue Debridement Description: Excisional Instrument: Curette Bleeding: Minimum Hemostasis Achieved: Pressure End Time: 13:38 Procedural Pain: 0 Post Procedural Pain: 0 Response to Treatment: Procedure was tolerated well Level of Consciousness (Post- Awake and Alert procedure): Post Debridement Measurements of Total Wound Length: (cm)  0.4 Width: (cm) 0.2 Depth: (cm) 0.3 Volume: (cm) 0.019 Character of Wound/Ulcer Post Debridement: Improved Severity of Tissue Post Debridement: Fat layer exposed Post Procedure Diagnosis Same as Pre-procedure Electronic Signature(s) Signed: 09/11/2021 2:03:55 PM By: Kalman Shan DO Signed: 09/16/2021 5:23:14 PM By: Rhae Hammock RN Entered By: Rhae Hammock on 09/11/2021 13:39:12 -------------------------------------------------------------------------------- HPI Details Patient Name: Date of Service: [name] [name], [name] 09/11/2021 12:45 PM Medical Record Number: [id number] Patient Account Number: [id number] Date of Birth/Sex: Treating RN: 02-06-1971 (50 y.o. [name],  [name] Primary Care Provider: [name] Other Clinician: Referring Provider: Treating Provider/Extender: Delano Metz in Treatment: 25 History of Present Illness HPI Description: 04/21/18 ADMISSION This is a 50 year old man who is a type II diabetic. In spite of fact his hemoglobin A1c is actually quite good 5.73 months ago he is at a really difficult time over the last 6-7 months. He developed a rapidly progressive infection in the right foot in November 2018 associated with osteomyelitis and necrotizing fasciitis. 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 The stump required and a revision on 11/24/17. The stump revision was advertised as being secondary to falls. I'm not sure the progressive history here however this area is actually closed over. The patient tells me over the same timeframe he has had wounds on the plantar aspect of his left foot. In the ED saw Dr. Sharol Given in April of this year. 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 It is really not clear that this patient is been dressing this with anything. He came in with the clinic without any specific dressing on the wound areas using his own tennis shoe. He tells me that  he only ambulates of course to do a pivot transfer. He does not have his prosthesis for the right leg as of yet and he blames Medicaid for this. He does however use the foot to push himself along in his wheelchair at home. The patient has not had formal arterial studies. ABI in our clinic on the left was 1.13. Patient's past medical history includes type 2 diabetes, fracture of the right fibula. I see that he was treated for abscesses on his right buttock and chest in 2016. I did not look at the microbiology of this. 04/28/18; patient comes back in the clinic today with the wound pretty much the same as when he came in here last week. Small opening lots of undermining relatively. He tells [place] that nothing is really been on this for 3 days in spite of the fact that we gave him enough to dress this easily especially such a small wound. He says he lives with his mother, she is not capable of assisting with this he is changing the dressings himself. 05/05/18; much better-looking wound today. Smaller. There is some undermining medially however that's only perhaps 2 mm. No surrounding erythema. We've been using silver collagen 05/12/18; small wound on the first metatarsal head. No undermining no surrounding erythema. We've been using silver collagen he has home health coming out to change 05/20/18 the wound on the first metatarsal head looks better. Covered in surface debris/callus nonviable tissue. Required debridement but post debridement this looks quite good. We've been using silver collagen. He has home health 05/27/18; first metatarsal head wound continues to improve. Just about completely closed. Still a lot of surface debridement callus. We've been using silver collagen 06/06/18; the first metatarsal wound is completely healed over. There is still a lot of callus. I gently removed some of this just to make sure that there was no open area and there is not. The patient mentions to me that his left leg has  felt like "lead" for about a week READMISSION 08/16/2020 This is a 50 year old man with type 2 diabetes. He returns to clinic today with a 1 month history of a blister and callus over the first metatarsal head. This is in exactly the same area as when he was here in 2019. He has  a right BKA and a prosthesis from a diabetic foot infection on the right in 2018. He has standard running shoes on the left foot to match the area in his prosthesis. He has only been covering this area with a Band-Aid has not really been specifically dressing this. ABI in our clinic on the left was 1.16. This is essentially stable from the value in 2019 10/1 the patient has a linear wound over the left first metatarsal head. Again not a lot of callus thick skin around this that I removed with a #3 curette. This is not go deep to bone or muscle it does not look to be infected. 10/8 first metatarsal head. The wound looks like it is closing however this is going to be a difficult area to offload in the future. He has a size 15 shoe and he says his shoes are years old. The inserts in these current shoes are totally useless and I have advised him to replace these. He may need a new pair of shoes and I have he got original prosthesis at [facility] I have asked him to go back there. 11/9; the patient has not been here in more than a month. Not sure he was healed when he was here the last time. I told him he would have to get new shoes and certainly offload the area over the left first metatarsal head. He has done nothing of the kind. He arrives back in clinic with the same old new balance sneakers. A very large separating callus over the first metatarsal head on the left 11/16; he has purchased new shoes and has at least 2 insoles like I asked. The wound is measuring much smaller. He is using silver alginate he changes the dressing himself 11/30; wound is measurably smaller in length of about a half a centimeter. This is  generally very little depth. We have been using silver alginate. He changes the dressing himself every second day. 12/14; wound is about the same size. Significant undermining medially relative to the size of the wound. We have been using silver alginate. There is not a way to offload this area as he walks with a prosthesis on the right leg 12/28; wound is about the same size. Again he has undermining medially. I am using polymen on this 12/02/2020; the wound looks smaller. Still a senescent edge. We have been using polymen 1/24; the wound is come down a few millimeters. Still a senescent edge. I have been using polymen 2/8; the wound is come down slightly. Still with slight undermining from 12-6 o'clock I have been using polymen partially to get offloading on this area which is opposed by his prosthesis on the other leg. 2/22 quite open improvement he still has a comma shaped wound on the left first metatarsal head. Some depth. Using polymen 3/14; he still has the same problem of a comma shaped area over the left first metatarsal head. This seems to have had more depth today at 0.6 cm. We have been using polymen. The patient changes this himself every second day. I explored the idea of a total contact cast on the left leg however this is his driving foot. He was not enamored with the idea of not being able to drive and states that he does not have anybody else to get him around 3/28; again he comes in with a smaller looking wound however there is depth and undermining requiring debridement. We have been using Hydrofera Blue, changed  to silver collagen today. The patient is doing the dressing himself 4/11; patient presents today for follow-up of his left diabetic foot wound. He denies any issues in the past 2 weeks. He is currently using silver collagen every other day with dressing changes. He does not use diabetic shoes or special inserts to his sneakers. He does not use felt pads for  offloading. 4/19; patient presents for his 1 week follow-up. He denies any issues. He reports using Hydrofera Blue and has not been using silver collagen for dressing changes. He reports minimal drainage to the wound. He overall feels well. 5/3; patient presents for 2-week follow-up. He has been using silver collagen without issues. He has no complaints today and states he overall feels well. 5/17; patient presents for 2-week follow-up. He states that the sleeve is rubbing up against the back of his right leg and causing a wound. He states this happened a week ago and has not been dressing the wound. The plantar wound is stable and he has been using collagen every other day. 5/24; Patient was had a recent hospitalization for cellulitis of his right popliteal fossa. He was admitted and started on IV antibiotics. He does not recall the plantar wound dressed while inpatient. He feels a lot better today. He was discharged on 5/21 on oral antibiotics and has not picked this up until today. He reports using collagen to the plantar wound since discharge. He is keeping the right posterior knee wound covered. He is getting a new sleeve for his prostatic on 5/26. He currently denies signs of infection. 6/1; patient presents for 1 week follow-up. He is still on doxycycline. He has not been dressing the back of the right knee wound. He states he is receiving his new prosthetic sleeve tomorrow. He states he is using silver alginate to the plantar foot wound. He currently denies signs of infection. He states he has not been offloading the left foot wound 6/7; patient presents for 1 week follow-up. He completes his last dose of doxycycline today. He finally received his prosthetic sleeve for the right side and he states it feels much better. He has been using antibiotic ointment to the back of that right knee wound. He has been using silver alginate to the plantar foot wound. He currently denies signs of  infection. 6/21; patient presents for 2-week follow-up. He reports no issues with his prosthetic sleeve. He has been using antibiotic ointment to the back of the right knee. He has been using silver alginate to the plantar foot wound. He has no issues or complaints today. He denies signs of infection. 7/5; patient presents for 2-week follow-up. He reports no issues or complaints today. He continues to use antibiotic ointment to the right posterior knee wound and silver alginate to the left plantar foot wound. He denies signs of infection. 7/19; patient presents for 2-week follow-up. He continues to use antibiotic ointment to the right posterior knee and silver alginate to the left plantar foot wound. He has no issues or complaints today. He denies signs of infection. 8/4; patient presents for 2-week follow-up. He has no issues or complaints today. He denies signs of infection. 8/18; patient presents for 2-week follow-up. He has no issues or concerns today. He has been approved for Apligraf and he would like to have this placed today. He denies signs of infection and reports that his right posterior knee wound is closed. 8/25; patient presents for 1 week follow-up. He had no issues with his first  Apligraf placement last week. He did however develop an abscess to the back of his right knee. He reports tenderness to this area. He denies systemic signs of infection. 9/1; patient presents for 1 week follow-up. He has had no issues with his plantar foot wound. The Apligraf has stayed in place over the past week. A wound culture was done at last clinic visit and a new antibiotic was sent to the pharmacy. Patient has not received this yet. He reports some mild tenderness to the back of his right knee. He denies systemic signs of infection. 9/8; patient presents for 1 week follow-up. He states he took Keflex and had diarrhea as a side effect. He was able to complete the course. He reports continued tenderness  to the right posterior knee wound. He has some drainage still. He denies increased warmth or erythema to the area. He denies fever/chills or nausea/vomiting. He has no issues or complaints today about his plantar foot wound. 9/15; patient presents for follow-up. He reports improvement in pain to the back of his right leg. He does not report any drainage. He had an ultrasound completed on 9/9 that showed a superficial fluid collection concerning for abscess. We attempted to call patient with results with no answer. We did put an urgent referral to general surgery and he reports he has not received a call. He currently denies systemic signs of infection. He reports no issues to his left plantar foot wound. 9/27; patient presents for follow-up. He has no issues or complaints today. He has had no issues with the previous wound to the back of his right leg. He has not made an appointment to see general surgery. He had Apligraf placed at last clinic visit to the plantar foot wound. He currently denies signs of infection. 10/7; patient presents for follow-up. Upon entering the room there is vomit throughout the entire floor. Patient states he does not feel well. I recommended he go to urgent care. I recommended following up next week for wound care. 10/10; patient presents for follow-up. He came last week for his follow-up however he was ill and started vomiting in the exam room. Today he states he feels better however he continues to have vomiting episodes. He states he attempted to go to urgent care/the ED but could not find a parking spot. He subsequently went home. He has no issues or complaints today regarding the foot wound. He never followed up with general surgery for the abscess identified on ultrasound to the back of his right knee. 10/20; patient presents for follow-up. He has no issues or complaints today. Overall he feels well today. Electronic Signature(s) Signed: 09/11/2021 2:03:55 PM By:  Kalman Shan DO Entered By: Kalman Shan on 09/11/2021 13:48:41 -------------------------------------------------------------------------------- Physical Exam Details Patient Name: Date of Service: [name] [name], [name] 09/11/2021 12:45 PM Medical Record Number: [id number] Patient Account Number: [id number] Date of Birth/Sex: Treating RN: [birth year] (50 y.o. [name] Primary Care Provider: [name] Other Clinician: Referring Provider: Treating Provider/Extender: Delano Metz in Treatment: 61 Constitutional respirations regular, non-labored and within target range for patient.. Cardiovascular 2+ dorsalis pedis/posterior tibialis pulses. Psychiatric pleasant and cooperative. Notes Left foot wound with circumferential callus and nonviable tissue. Post debridement there is granulation tissue present. At last clinic visit callus was lifting up however today it appears intact with no evidence of a new wound. Electronic Signature(s) Signed: 09/11/2021 2:03:55 PM By: Kalman Shan DO Entered By: Kalman Shan on 09/11/2021 13:51:08 -------------------------------------------------------------------------------- Physician  Orders Details Patient Name: Date of Service: [name] [name] 09/11/2021 12:45 PM Medical Record Number: [id number] Patient Account Number: [id number] Date of Birth/Sex: Treating RN: 07-Aug-1971 (50 y.o. [name], [name] Primary Care Provider: [name] Other Clinician: Referring Provider: Treating Provider/Extender: Delano Metz in Treatment: 44 Verbal / Phone Orders: No Diagnosis Coding ICD-10 Coding Code Description [phone] Non-pressure chronic ulcer of other part of left foot with fat layer exposed E11.621 Type 2 diabetes mellitus with foot ulcer S81.801D Unspecified open wound, right lower leg, subsequent encounter E11.42 Type 2 diabetes mellitus with diabetic  polyneuropathy Z89.511 Acquired absence of right leg below knee Follow-up Appointments ppointment in 1 week. - with Dr. Heber [place] (Thurs/Fri) Return A Other: - Patient given number to call General Surgery re: right posterior leg Cellular or Tissue Based Products Cellular or Tissue Based Product Type: - Apligraf #8 Edema Control - Lymphedema / SCD / Other Elevate legs to the level of the heart or above for 30 minutes daily and/or when sitting, a frequency of: Avoid standing for long periods of time. Wound Treatment Wound #2R - Metatarsal head first Wound Laterality: Left Cleanser: Soap and Water 1 x Per Week/15 Days Discharge Instructions: May shower and wash wound with dial antibacterial soap and water prior to dressing change. Cleanser: Wound Cleanser (Generic) 1 x Per Week/15 Days Discharge Instructions: Cleanse the wound with wound cleanser prior to applying a clean dressing using gauze sponges, not tissue or cotton balls. Peri-Wound Care: Sween Lotion (Moisturizing lotion) 1 x Per Week/15 Days Discharge Instructions: Apply moisturizing lotion to leg Secondary Dressing: Woven Gauze Sponges 2x2 in (Generic) 1 x Per Week/15 Days Discharge Instructions: Apply over primary dressing as directed. Secondary Dressing: Optifoam Non-Adhesive Dressing, 4x4 in (Generic) 1 x Per Week/15 Days Discharge Instructions: cut to make foam donut to help offload Secondary Dressing: ADAPTIC TOUCH 3x4.25 in 1 x Per Week/15 Days Discharge Instructions: Apply over primary dressing as directed. Secured With: Child psychotherapist, Sterile 2x75 (in/in) (Generic) 1 x Per Week/15 Days Discharge Instructions: Secure with stretch gauze as directed. Secured With: 65M Medipore H Soft Cloth Surgical Tape, 2x2 (in/yd) (Generic) 1 x Per Week/15 Days Discharge Instructions: Secure dressing with tape as directed. [facility] - Referral for abscess of right posterior leg Electronic  Signature(s) Signed: 09/16/2021 12:04:01 PM By: Kalman Shan DO Signed: 09/16/2021 5:23:14 PM By: Rhae Hammock RN Previous Signature: 09/11/2021 2:03:55 PM Version By: Kalman Shan DO Entered By: Rhae Hammock on 09/16/2021 08:15:04 Prescription 09/11/2021 -------------------------------------------------------------------------------- Trauger, Gurkirat L. Kalman Shan DO Patient Name: Provider: Mar 30, 1971 [id number] Date of Birth: NPI#: [name] [id number] Sex: DEA #: [phone] Phone #: License #: [place] Patient Address: [address] [employer], [place] [address] [place], [place] [address] [phone] Allergies adhesive tape Provider's Orders Orthopedic - Referral for abscess of right posterior leg Hand Signature: Date(s): Electronic Signature(s) Signed: 09/16/2021 12:04:01 PM By: Kalman Shan DO Signed: 09/16/2021 5:23:14 PM By: Rhae Hammock RN Entered By: Rhae Hammock on 09/16/2021 08:15:04 -------------------------------------------------------------------------------- Problem List Details Patient Name: Date of Service: [name] [name], [name] 09/11/2021 12:45 PM Medical Record Number: [id number] Patient Account Number: [id number] Date of Birth/Sex: Treating RN: Jan 11, 1971 (50 y.o. [name] Primary Care Provider: [name] Other Clinician: Referring Provider: Treating Provider/Extender: Delano Metz in Treatment: 43 Active Problems ICD-10 Encounter Code Description Active Date MDM Diagnosis [phone] Non-pressure chronic ulcer of other  part of left foot with fat layer exposed 08/16/2020 No Yes E11.621 Type 2 diabetes mellitus with foot ulcer 08/16/2020 No Yes S81.801D Unspecified open wound, right lower leg, subsequent encounter 04/08/2021 No Yes E11.42 Type 2 diabetes mellitus with diabetic polyneuropathy  08/16/2020 No Yes Z89.511 Acquired absence of right leg below knee 08/16/2020 No Yes Inactive Problems Resolved Problems Electronic Signature(s) Signed: 09/11/2021 2:03:55 PM By: Kalman Shan DO Entered By: Kalman Shan on 09/11/2021 13:48:03 -------------------------------------------------------------------------------- Progress Note Details Patient Name: Date of Service: [name] [name], [name] 09/11/2021 12:45 PM Medical Record Number: [id number] Patient Account Number: [id number] Date of Birth/Sex: Treating RN: 07/15/1971 (50 y.o. [name], [name] Primary Care Provider: [name] Other Clinician: Referring Provider: Treating Provider/Extender: Delano Metz in Treatment: 59 Subjective Chief Complaint Information obtained from Patient patient is here for a review of the wound on his plantar left first metatarsal head and back of his right leg History of Present Illness (HPI) 04/21/18 ADMISSION This is a 50 year old man who is a type II diabetic. In spite of fact his hemoglobin A1c is actually quite good 5.73 months ago he is at a really difficult time over the last 6-7 months. He developed a rapidly progressive infection in the right foot in November 2018 associated with osteomyelitis and necrotizing fasciitis. 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 The stump required and a revision on 11/24/17. The stump revision was advertised as being secondary to falls. I'm not sure the progressive history here however this area is actually closed over. The patient tells me over the same timeframe he has had wounds on the plantar aspect of his left foot. In the ED saw Dr. Sharol Given in April of this year. 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 It is really not clear that this patient is been dressing this with anything. He came in with the clinic without any specific dressing on the wound areas using his own tennis  shoe. He tells me that he only ambulates of course to do a pivot transfer. He does not have his prosthesis for the right leg as of yet and he blames Medicaid for this. He does however use the foot to push himself along in his wheelchair at home. The patient has not had formal arterial studies. ABI in our clinic on the left was 1.13. Patient's past medical history includes type 2 diabetes, fracture of the right fibula. I see that he was treated for abscesses on his right buttock and chest in 2016. I did not look at the microbiology of this. 04/28/18; patient comes back in the clinic today with the wound pretty much the same as when he came in here last week. Small opening lots of undermining relatively. He tells [place] that nothing is really been on this for 3 days in spite of the fact that we gave him enough to dress this easily especially such a small wound. He says he lives with his mother, she is not capable of assisting with this he is changing the dressings himself. 05/05/18; much better-looking wound today. Smaller. There is some undermining medially however that's only perhaps 2 mm. No surrounding erythema. We've been using silver collagen 05/12/18; small wound on the first metatarsal head. No undermining no surrounding erythema. We've been using silver collagen he has home health coming out to change 05/20/18 the wound on the first metatarsal head looks better. Covered in surface debris/callus nonviable tissue. Required debridement  but post debridement this looks quite good. We've been using silver collagen. He has home health 05/27/18; first metatarsal head wound continues to improve. Just about completely closed. Still a lot of surface debridement callus. We've been using silver collagen 06/06/18; the first metatarsal wound is completely healed over. There is still a lot of callus. I gently removed some of this just to make sure that there was no open area and there is not. The patient mentions to me  that his left leg has felt like "lead" for about a week READMISSION 08/16/2020 This is a 50 year old man with type 2 diabetes. He returns to clinic today with a 1 month history of a blister and callus over the first metatarsal head. This is in exactly the same area as when he was here in 2019. 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 He has standard running shoes on the left foot to match the area in his prosthesis. He has only been covering this area with a Band-Aid has not really been specifically dressing this. ABI in our clinic on the left was 1.16. This is essentially stable from the value in 2019 10/1 the patient has a linear wound over the left first metatarsal head. Again not a lot of callus thick skin around this that I removed with a #3 curette. This is not go deep to bone or muscle it does not look to be infected. 10/8 first metatarsal head. The wound looks like it is closing however this is going to be a difficult area to offload in the future. He has a size 15 shoe and he says his shoes are years old. The inserts in these current shoes are totally useless and I have advised him to replace these. He may need a new pair of shoes and I have he got original prosthesis at [facility] I have asked him to go back there. 11/9; the patient has not been here in more than a month. Not sure he was healed when he was here the last time. I told him he would have to get new shoes and certainly offload the area over the left first metatarsal head. He has done nothing of the kind. He arrives back in clinic with the same old new balance sneakers. A very large separating callus over the first metatarsal head on the left 11/16; he has purchased new shoes and has at least 2 insoles like I asked. The wound is measuring much smaller. He is using silver alginate he changes the dressing himself 11/30; wound is measurably smaller in length of about a half  a centimeter. This is generally very little depth. We have been using silver alginate. He changes the dressing himself every second day. 12/14; wound is about the same size. Significant undermining medially relative to the size of the wound. We have been using silver alginate. There is not a way to offload this area as he walks with a prosthesis on the right leg 12/28; wound is about the same size. Again he has undermining medially. I am using polymen on this 12/02/2020; the wound looks smaller. Still a senescent edge. We have been using polymen 1/24; the wound is come down a few millimeters. Still a senescent edge. I have been using polymen 2/8; the wound is come down slightly. Still with slight undermining from 12-6 o'clock I have been using polymen partially to get offloading on this area which is  opposed by his prosthesis on the other leg. 2/22 quite open improvement he still has a comma shaped wound on the left first metatarsal head. Some depth. Using polymen 3/14; he still has the same problem of a comma shaped area over the left first metatarsal head. This seems to have had more depth today at 0.6 cm. We have been using polymen. The patient changes this himself every second day. I explored the idea of a total contact cast on the left leg however this is his driving foot. He was not enamored with the idea of not being able to drive and states that he does not have anybody else to get him around 3/28; again he comes in with a smaller looking wound however there is depth and undermining requiring debridement. We have been using Hydrofera Blue, changed to silver collagen today. The patient is doing the dressing himself 4/11; patient presents today for follow-up of his left diabetic foot wound. He denies any issues in the past 2 weeks. He is currently using silver collagen every other day with dressing changes. He does not use diabetic shoes or special inserts to his sneakers. He does not use felt  pads for offloading. 4/19; patient presents for his 1 week follow-up. He denies any issues. He reports using Hydrofera Blue and has not been using silver collagen for dressing changes. He reports minimal drainage to the wound. He overall feels well. 5/3; patient presents for 2-week follow-up. He has been using silver collagen without issues. He has no complaints today and states he overall feels well. 5/17; patient presents for 2-week follow-up. He states that the sleeve is rubbing up against the back of his right leg and causing a wound. He states this happened a week ago and has not been dressing the wound. The plantar wound is stable and he has been using collagen every other day. 5/24; Patient was had a recent hospitalization for cellulitis of his right popliteal fossa. He was admitted and started on IV antibiotics. He does not recall the plantar wound dressed while inpatient. He feels a lot better today. He was discharged on 5/21 on oral antibiotics and has not picked this up until today. He reports using collagen to the plantar wound since discharge. He is keeping the right posterior knee wound covered. He is getting a new sleeve for his prostatic on 5/26. He currently denies signs of infection. 6/1; patient presents for 1 week follow-up. He is still on doxycycline. He has not been dressing the back of the right knee wound. He states he is receiving his new prosthetic sleeve tomorrow. He states he is using silver alginate to the plantar foot wound. He currently denies signs of infection. He states he has not been offloading the left foot wound 6/7; patient presents for 1 week follow-up. He completes his last dose of doxycycline today. He finally received his prosthetic sleeve for the right side and he states it feels much better. He has been using antibiotic ointment to the back of that right knee wound. He has been using silver alginate to the plantar foot wound. He currently denies signs of  infection. 6/21; patient presents for 2-week follow-up. He reports no issues with his prosthetic sleeve. He has been using antibiotic ointment to the back of the right knee. He has been using silver alginate to the plantar foot wound. He has no issues or complaints today. He denies signs of infection. 7/5; patient presents for 2-week follow-up. He reports no issues or  complaints today. He continues to use antibiotic ointment to the right posterior knee wound and silver alginate to the left plantar foot wound. He denies signs of infection. 7/19; patient presents for 2-week follow-up. He continues to use antibiotic ointment to the right posterior knee and silver alginate to the left plantar foot wound. He has no issues or complaints today. He denies signs of infection. 8/4; patient presents for 2-week follow-up. He has no issues or complaints today. He denies signs of infection. 8/18; patient presents for 2-week follow-up. He has no issues or concerns today. He has been approved for Apligraf and he would like to have this placed today. He denies signs of infection and reports that his right posterior knee wound is closed. 8/25; patient presents for 1 week follow-up. He had no issues with his first Apligraf placement last week. He did however develop an abscess to the back of his right knee. He reports tenderness to this area. He denies systemic signs of infection. 9/1; patient presents for 1 week follow-up. He has had no issues with his plantar foot wound. The Apligraf has stayed in place over the past week. A wound culture was done at last clinic visit and a new antibiotic was sent to the pharmacy. Patient has not received this yet. He reports some mild tenderness to the back of his right knee. He denies systemic signs of infection. 9/8; patient presents for 1 week follow-up. He states he took Keflex and had diarrhea as a side effect. He was able to complete the course. He reports continued tenderness  to the right posterior knee wound. He has some drainage still. He denies increased warmth or erythema to the area. He denies fever/chills or nausea/vomiting. He has no issues or complaints today about his plantar foot wound. 9/15; patient presents for follow-up. He reports improvement in pain to the back of his right leg. He does not report any drainage. He had an ultrasound completed on 9/9 that showed a superficial fluid collection concerning for abscess. We attempted to call patient with results with no answer. We did put an urgent referral to general surgery and he reports he has not received a call. He currently denies systemic signs of infection. He reports no issues to his left plantar foot wound. 9/27; patient presents for follow-up. He has no issues or complaints today. He has had no issues with the previous wound to the back of his right leg. He has not made an appointment to see general surgery. He had Apligraf placed at last clinic visit to the plantar foot wound. He currently denies signs of infection. 10/7; patient presents for follow-up. Upon entering the room there is vomit throughout the entire floor. Patient states he does not feel well. I recommended he go to urgent care. I recommended following up next week for wound care. 10/10; patient presents for follow-up. He came last week for his follow-up however he was ill and started vomiting in the exam room. Today he states he feels better however he continues to have vomiting episodes. He states he attempted to go to urgent care/the ED but could not find a parking spot. He subsequently went home. He has no issues or complaints today regarding the foot wound. He never followed up with general surgery for the abscess identified on ultrasound to the back of his right knee. 10/20; patient presents for follow-up. He has no issues or complaints today. Overall he feels well today. Patient History Information obtained from Patient. Family  History Diabetes - Maternal Grandparents,Mother, Heart Disease - Father, Hypertension - Father, No family history of Cancer, Hereditary Spherocytosis, Kidney Disease, Lung Disease, Seizures, Stroke, Thyroid Problems, Tuberculosis. Social History Current every day smoker, Marital Status - Single, Alcohol Use - Never, Drug Use - No History, Caffeine Use - Never. Medical History Eyes Denies history of Cataracts, Glaucoma, Optic Neuritis Ear/Nose/Mouth/Throat Denies history of Chronic sinus problems/congestion, Middle ear problems Hematologic/Lymphatic Patient has history of Anemia Denies history of Hemophilia, Human Immunodeficiency Virus, Lymphedema, Sickle Cell Disease Respiratory Denies history of Aspiration, Asthma, Chronic Obstructive Pulmonary Disease (COPD), Pneumothorax, Sleep Apnea, Tuberculosis Cardiovascular Patient has history of Hypertension, Peripheral Venous Disease Denies history of Angina, Arrhythmia, Congestive Heart Failure, Coronary Artery Disease, Deep Vein Thrombosis, Hypotension, Myocardial Infarction, Peripheral Arterial Disease, Phlebitis, Vasculitis Gastrointestinal Denies history of Cirrhosis , Colitis, Crohnoos, Hepatitis A, Hepatitis B, Hepatitis C Endocrine Patient has history of Type II Diabetes - oral meds Denies history of Type I Diabetes Genitourinary Denies history of End Stage Renal Disease Immunological Denies history of Lupus Erythematosus, Raynaudoos, Scleroderma Integumentary (Skin) Denies history of History of Burn Musculoskeletal Denies history of Gout, Rheumatoid Arthritis, Osteoarthritis, Osteomyelitis Neurologic Denies history of Dementia, Neuropathy, Quadriplegia, Paraplegia, Seizure Disorder Oncologic Denies history of Received Chemotherapy, Received Radiation Psychiatric Denies history of Anorexia/bulimia, Confinement Anxiety Hospitalization/Surgery History - fever 105, sepsis cellulitis right popliteal fossa 04/09/2021. Medical A  Surgical History Notes [name] Constitutional Symptoms (General Health) falls , leukocytosis Respiratory During waking hours and catches himself not breathing, "gasp for air". Saw Dr Wynonia Lawman, he couldn't find a reason Objective Constitutional respirations regular, non-labored and within target range for patient.. Vitals Time Taken: 1:01 PM, Height: 77 in, Weight: 232 lbs, BMI: 27.5, Temperature: 98.4 F, Pulse: 93 bpm, Respiratory Rate: 18 breaths/min, Blood Pressure: 131/84 mmHg. Cardiovascular 2+ dorsalis pedis/posterior tibialis pulses. Psychiatric pleasant and cooperative. General Notes: Left foot wound with circumferential callus and nonviable tissue. Post debridement there is granulation tissue present. At last clinic visit callus was lifting up however today it appears intact with no evidence of a new wound. Integumentary (Hair, Skin) Wound #2R status is Open. Original cause of wound was Blister. The date acquired was: 06/23/2020. The wound has been in treatment 55 weeks. The wound is located on the Left Metatarsal head first. The wound measures 0.4cm length x 0.2cm width x 0.3cm depth; 0.063cm^2 area and 0.019cm^3 volume. There is Fat Layer (Subcutaneous Tissue) exposed. There is a small amount of serosanguineous drainage noted. The wound margin is well defined and not attached to the wound base. There is large (67-100%) red granulation within the wound bed. There is no necrotic tissue within the wound bed. 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 No signs of infection. I debrided nonviable tissue and Apligraf #8 was placed in standard fashion  today. Procedures Wound #2R Pre-procedure diagnosis of Wound #2R is a Diabetic Wound/Ulcer of the Lower Extremity located on the Left Metatarsal head first .Severity of Tissue Pre Debridement is: Fat layer exposed. There was a Excisional Skin/Subcutaneous Tissue Debridement with a total area of 0.08 sq cm performed by Kalman Shan, DO. With the following instrument(s): Curette to remove Viable and Non-Viable tissue/material. Material removed includes Callus, Subcutaneous Tissue, Skin:  Dermis, and Skin: Epidermis after achieving pain control using Lidocaine. No specimens were taken. A time out was conducted at 13:38, prior to the start of the procedure. A Minimum amount of bleeding was controlled with Pressure. The procedure was tolerated well with a pain level of 0 throughout and a pain level of 0 following the procedure. Post Debridement Measurements: 0.4cm length x 0.2cm width x 0.3cm depth; 0.019cm^3 volume. Character of Wound/Ulcer Post Debridement is improved. Severity of Tissue Post Debridement is: Fat layer exposed. Post procedure Diagnosis Wound #2R: Same as Pre-Procedure Plan Follow-up Appointments: Return Appointment in 1 week. - with Dr. Heber Porter (Thurs/Fri) Other: - Patient given number to call General Surgery re: right posterior leg Cellular or Tissue Based Products: Cellular or Tissue Based Product Type: - Apligraf #8 Edema Control - Lymphedema / SCD / Other: Elevate legs to the level of the heart or above for 30 minutes daily and/or when sitting, a frequency of: Avoid standing for long periods of time. WOUND #2R: - Metatarsal head first Wound Laterality: Left Cleanser: Soap and Water 1 x Per Week/15 Days Discharge Instructions: May shower and wash wound with dial antibacterial soap and water prior to dressing change. Cleanser: Wound Cleanser (Generic) 1 x Per Week/15 Days Discharge Instructions: Cleanse the wound with wound cleanser prior to applying a clean dressing using  gauze sponges, not tissue or cotton balls. Peri-Wound Care: Sween Lotion (Moisturizing lotion) 1 x Per Week/15 Days Discharge Instructions: Apply moisturizing lotion to leg Secondary Dressing: Woven Gauze Sponges 2x2 in (Generic) 1 x Per Week/15 Days Discharge Instructions: Apply over primary dressing as directed. Secondary Dressing: Optifoam Non-Adhesive Dressing, 4x4 in (Generic) 1 x Per Week/15 Days Discharge Instructions: cut to make foam donut to help offload Secondary Dressing: ADAPTIC TOUCH 3x4.25 in 1 x Per Week/15 Days Discharge Instructions: Apply over primary dressing as directed. Secured With: Child psychotherapist, Sterile 2x75 (in/in) (Generic) 1 x Per Week/15 Days Discharge Instructions: Secure with stretch gauze as directed. Secured With: 69M Medipore H Soft Cloth Surgical T ape, 2x2 (in/yd) (Generic) 1 x Per Week/15 Days Discharge Instructions: Secure dressing with tape as directed. 1. Apligraf #8 placed in standard fashion 2. Follow-up in 1 week 3. In office sharp debridement Electronic Signature(s) Signed: 09/11/2021 2:03:55 PM By: Kalman Shan DO Entered By: Kalman Shan on 09/11/2021 14:03:11 -------------------------------------------------------------------------------- HxROS Details Patient Name: Date of Service: [name] [name], [name] 09/11/2021 12:45 PM Medical Record Number: [id number] Patient Account Number: [id number] Date of Birth/Sex: Treating RN: [birth year] (50 y.o. [name] Primary Care Provider: [name] Other Clinician: Referring Provider: Treating Provider/Extender: Delano Metz in Treatment: 57 Information Obtained From Patient Constitutional Symptoms (General Health) Medical History: Past Medical History Notes: falls , leukocytosis Eyes Medical History: Negative for: Cataracts; Glaucoma; Optic Neuritis Ear/Nose/Mouth/Throat Medical History: Negative for: Chronic sinus  problems/congestion; Middle ear problems Hematologic/Lymphatic Medical History: Positive for: Anemia Negative for: Hemophilia; Human Immunodeficiency Virus; Lymphedema; Sickle Cell Disease Respiratory Medical History: Negative for: Aspiration; Asthma; Chronic Obstructive Pulmonary Disease (COPD); Pneumothorax; Sleep Apnea; Tuberculosis Past Medical History Notes: During waking hours and catches himself not breathing, "gasp for air". Saw Dr Wynonia Lawman, he couldn't find a reason Cardiovascular Medical History: Positive for: Hypertension; Peripheral Venous Disease Negative for: Angina; Arrhythmia; Congestive Heart Failure; Coronary Artery Disease; Deep Vein Thrombosis; Hypotension; Myocardial Infarction; Peripheral Arterial Disease; Phlebitis; Vasculitis Gastrointestinal Medical History: Negative for: Cirrhosis ; Colitis; Crohns; Hepatitis A; Hepatitis B; Hepatitis C Endocrine Medical History: Positive for: Type II  Diabetes - oral meds Negative for: Type I Diabetes Time with diabetes: 2014 Treated with: Oral agents Blood sugar tested every day: Yes Tested : Blood sugar testing results: Breakfast: 91 Genitourinary Medical History: Negative for: End Stage Renal Disease Immunological Medical History: Negative for: Lupus Erythematosus; Raynauds; Scleroderma Integumentary (Skin) Medical History: Negative for: History of Burn Musculoskeletal Medical History: Negative for: Gout; Rheumatoid Arthritis; Osteoarthritis; Osteomyelitis Neurologic Medical History: Negative for: Dementia; Neuropathy; Quadriplegia; Paraplegia; Seizure Disorder Oncologic Medical History: Negative for: Received Chemotherapy; Received Radiation Psychiatric Medical History: Negative for: Anorexia/bulimia; Confinement Anxiety Immunizations Pneumococcal Vaccine: Received Pneumococcal Vaccination: No Implantable Devices No devices added Hospitalization / Surgery History Type of  Hospitalization/Surgery fever 105, sepsis cellulitis right popliteal fossa 04/09/2021 Family and Social History Cancer: No; Diabetes: Yes - Maternal Grandparents,Mother; Heart Disease: Yes - Father; Hereditary Spherocytosis: No; Hypertension: Yes - Father; Kidney Disease: No; Lung Disease: No; Seizures: No; Stroke: No; Thyroid Problems: No; Tuberculosis: No; Current every day smoker; Marital Status - Single; Alcohol Use: Never; Drug Use: No History; Caffeine Use: Never; Financial Concerns: No; Food, Clothing or Shelter Needs: No; Support System Lacking: No; Transportation Concerns: No Electronic Signature(s) Signed: 09/11/2021 2:03:55 PM By: Kalman Shan DO Signed: 09/16/2021 5:23:14 PM By: Rhae Hammock RN Entered By: Kalman Shan on 09/11/2021 13:48:48 -------------------------------------------------------------------------------- SuperBill Details Patient Name: Date of Service: [name], [name] 09/11/2021 Medical Record Number: [id number] Patient Account Number: [id number] Date of Birth/Sex: Treating RN: 26-Jul-1971 (50 y.o. [name], [name] Primary Care Provider: [name] Other Clinician: Referring Provider: Treating Provider/Extender: Delano Metz in Treatment: 72 Diagnosis Coding ICD-10 Codes Code Description [phone] Non-pressure chronic ulcer of other part of left foot with fat layer exposed E11.621 Type 2 diabetes mellitus with foot ulcer S81.801D Unspecified open wound, right lower leg, subsequent encounter E11.42 Type 2 diabetes mellitus with diabetic polyneuropathy Z89.511 Acquired absence of right leg below knee Facility Procedures CPT4 Code: [id number] Description: 54008 - DEB SUBQ TISSUE 20 SQ CM/< ICD-10 Diagnosis Description L97.522 Non-pressure chronic ulcer of other part of left foot with fat layer exposed E11.621 Type 2 diabetes mellitus with foot ulcer Modifier: Quantity: 1 CPT4 Code: [id number] Description:  (Facility Use Only) Apligraf 1 SQ CM Modifier: Quantity: 94 CPT4 Code: [id number] Description: 80998 - SKIN SUB GRAFT FACE/NK/HF/G ICD-10 Diagnosis Description L97.522 Non-pressure chronic ulcer of other part of left foot with fat layer exposed Modifier: Quantity: 1 Physician Procedures : CPT4 Code Description Modifier [id number] - WC PHYS SUBQ TISS 20 SQ CM ICD-10 Diagnosis Description L97.522 Non-pressure chronic ulcer of other part of left foot with fat layer 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

## 2021-09-18 ENCOUNTER — Other Ambulatory Visit: Payer: Self-pay

## 2021-09-18 ENCOUNTER — Encounter (HOSPITAL_BASED_OUTPATIENT_CLINIC_OR_DEPARTMENT_OTHER): Payer: Medicare Other | Admitting: Internal Medicine

## 2021-09-18 DIAGNOSIS — E11621 Type 2 diabetes mellitus with foot ulcer: Secondary | ICD-10-CM | POA: Diagnosis not present

## 2021-09-18 DIAGNOSIS — L97522 Non-pressure chronic ulcer of other part of left foot with fat layer exposed: Secondary | ICD-10-CM

## 2021-09-19 ENCOUNTER — Ambulatory Visit (HOSPITAL_COMMUNITY)
Admission: RE | Admit: 2021-09-19 | Discharge: 2021-09-19 | Disposition: A | Discharge status: Home / Self Care | Payer: Medicare Other | Source: Ambulatory Visit | Attending: Nephrology | Admitting: Nephrology

## 2021-09-19 VITALS — BP 125/86 | HR 94 | Temp 97.1°F | Resp 18

## 2021-09-19 DIAGNOSIS — D649 Anemia, unspecified: Secondary | ICD-10-CM | POA: Insufficient documentation

## 2021-09-19 DIAGNOSIS — N179 Acute kidney failure, unspecified: Secondary | ICD-10-CM | POA: Diagnosis present

## 2021-09-19 DIAGNOSIS — D62 Acute posthemorrhagic anemia: Secondary | ICD-10-CM | POA: Insufficient documentation

## 2021-09-19 LAB — IRON AND TIBC
Iron: 84 ug/dL (ref 45–182)
Saturation Ratios: 26 % (ref 17.9–39.5)
TIBC: 328 ug/dL (ref 250–450)
UIBC: 244 ug/dL

## 2021-09-19 LAB — POCT HEMOGLOBIN-HEMACUE: Hemoglobin: 11.2 g/dL — ABNORMAL LOW (ref 13.0–17.0)

## 2021-09-19 LAB — FERRITIN: Ferritin: 16 ng/mL — ABNORMAL LOW (ref 24–336)

## 2021-09-19 MED ORDER — EPOETIN ALFA-EPBX 10000 UNIT/ML IJ SOLN
10000.0000 [IU] | INTRAMUSCULAR | Status: DC
  Administered 2021-09-19: 10000 [IU] via SUBCUTANEOUS

## 2021-09-19 MED ORDER — EPOETIN ALFA-EPBX 10000 UNIT/ML IJ SOLN
INTRAMUSCULAR | Status: AC
  Filled 2021-09-19: qty 1

## 2021-09-19 NOTE — Progress Notes (Signed)
[name], [name] ([id number]) Visit Report for 09/18/2021 Arrival Information Details Patient Name: Date of Service: [name] [name] 09/18/2021 12:45 PM Medical Record Number: [id number] Patient Account Number: [id number] Date of Birth/Sex: Treating RN: 02-02-1971 (50 y.o. [name], [name] Primary Care Kathlynn Swofford: [name] Other Clinician: Referring Corrin Hingle: Treating Juline Sanderford/Extender: Delano Metz in Treatment: 76 Visit Information History Since Last Visit Added or deleted any medications: No Patient Arrived: Ambulatory Any new allergies or adverse reactions: No Arrival Time: 13:10 Had a fall or experienced change in No Accompanied By: self activities of daily living that may affect Transfer Assistance: None risk of falls: Patient Identification Verified: Yes Signs or symptoms of abuse/neglect since last visito No Secondary Verification Process Completed: Yes Hospitalized since last visit: No Patient Requires Transmission-Based Precautions: No Implantable device outside of the clinic excluding No Patient Has Alerts: No cellular tissue based products placed in the center since last visit: Has Dressing in Place as Prescribed: Yes Pain Present Now: Yes Electronic Signature(s) Signed: 09/19/2021 12:25:45 PM By: Rhae Hammock RN Entered By: Rhae Hammock on 09/18/2021 13:10:47 -------------------------------------------------------------------------------- Encounter Discharge Information Details Patient Name: Date of Service: [name] [name] ND, [name] 09/18/2021 12:45 PM Medical Record Number: [id number] Patient Account Number: [id number] Date of Birth/Sex: Treating RN: 1971-04-01 (50 y.o. [name], [name] Primary Care Novella Abraha: [name] Other Clinician: Referring Kapena Hamme: Treating Lovelyn Sheeran/Extender: Delano Metz in Treatment: 40 Encounter Discharge Information Items Post Procedure  Vitals Discharge Condition: Stable Temperature (F): 98.7 Ambulatory Status: Ambulatory Pulse (bpm): 74 Discharge Destination: Home Respiratory Rate (breaths/min): 17 Transportation: Private Auto Blood Pressure (mmHg): 134/74 Accompanied By: self Schedule Follow-up Appointment: Yes Clinical Summary of Care: Patient Declined Electronic Signature(s) Signed: 09/19/2021 12:25:45 PM By: Rhae Hammock RN Entered By: Rhae Hammock on 09/18/2021 14:12:44 -------------------------------------------------------------------------------- Lower Extremity Assessment Details Patient Name: Date of Service: [name], [name] 09/18/2021 12:45 PM Medical Record Number: [id number] Patient Account Number: [id number] Date of Birth/Sex: Treating RN: 04-27-1971 (50 y.o. [name], [name] Primary Care Emogene Muratalla: [name] Other Clinician: Referring Sofiah Lyne: Treating Rajveer Handler/Extender: Delano Metz in Treatment: 56 Edema Assessment Assessed: [name]: Yes] [name]: No] Edema: [Left: N] [Right: o] Calf Left: Right: Point of Measurement: 50 cm From Medial Instep 39.8 cm Ankle Left: Right: Point of Measurement: 11 cm From Medial Instep 21.2 cm Vascular Assessment Pulses: Dorsalis Pedis Palpable: [Left:Yes] Electronic Signature(s) Signed: 09/19/2021 12:25:45 PM By: Rhae Hammock RN Entered By: Rhae Hammock on 09/18/2021 13:16:23 -------------------------------------------------------------------------------- Multi Wound Chart Details Patient Name: Date of Service: [name] [name] ND, [name] 09/18/2021 12:45 PM Medical Record Number: [id number] Patient Account Number: [id number] Date of Birth/Sex: Treating RN: 11/02/1971 (50 y.o. [name], [name] Primary Care [name] [name]: [name] Other Clinician: Referring Hildreth Robart: Treating Thailan Sava/Extender: Delano Metz in Treatment: 33 Vital Signs Height(in):  77 Pulse(bpm): 108 Weight(lbs): 232 Blood Pressure(mmHg): 134/73 Body Mass Index(BMI): 28 Temperature(F): 97.8 Respiratory Rate(breaths/min): 17 Photos: [2R:Left Metatarsal head first] [N/A:N/A N/A] Wound Location: [2R:Blister] [N/A:N/A] Wounding Event: [2R:Diabetic Wound/Ulcer of the Lower] [N/A:N/A] Primary Etiology: [2R:Extremity Anemia, Hypertension, Peripheral] [N/A:N/A] Comorbid History: [2R:Venous Disease, Type II Diabetes 06/23/2020] [N/A:N/A] Date Acquired: [2R:56] [N/A:N/A] Weeks of Treatment: [2R:Open] [N/A:N/A] Wound Status: [2R:Yes] [N/A:N/A] Wound Recurrence: [2R:0.3x0.2x0.4] [N/A:N/A] Measurements L x W x D (cm) [2R:0.047] [N/A:N/A] A (cm) : rea [2G:2.542] [N/A:N/A] Volume (cm) : [2R:-51.60%] [N/A:N/A] % Reduction in A [2R:rea: -533.30%] [N/A:N/A] % Reduction in Volume: [2R:12] Starting Position 1 (o'clock): [2R:12] Ending Position 1 (o'clock): [2R:0.7] Maximum  Distance 1 (cm): [2R:Yes] [N/A:N/A] Undermining: [2R:Grade 2] [N/A:N/A] Classification: [2R:Medium] [N/A:N/A] Exudate A mount: [2R:Serosanguineous] [N/A:N/A] Exudate Type: [2R:red, brown] [N/A:N/A] Exudate Color: [2R:Well defined, not attached] [N/A:N/A] Wound Margin: [2R:Large (67-100%)] [N/A:N/A] Granulation A mount: [2R:Red] [N/A:N/A] Granulation Quality: [2R:None Present (0%)] [N/A:N/A] Necrotic A mount: [2R:Fat Layer (Subcutaneous Tissue): Yes N/A] Exposed Structures: [2R:Fascia: No Tendon: No Muscle: No Joint: No Bone: No None] [N/A:N/A] Epithelialization: [2R:Debridement - Excisional] [N/A:N/A] Debridement: Pre-procedure Verification/Time Out 13:45 [N/A:N/A] Taken: [2R:Lidocaine] [N/A:N/A] Pain Control: [2R:Callus, Subcutaneous] [N/A:N/A] Tissue Debrided: [2R:Skin/Subcutaneous Tissue] [N/A:N/A] Level: [2R:0.06] [N/A:N/A] Debridement A (sq cm): [2R:rea Curette] [N/A:N/A] Instrument: [2R:Minimum] [N/A:N/A] Bleeding: [2R:Pressure] [N/A:N/A] Hemostasis A chieved: [2R:0] [N/A:N/A] Procedural  Pain: [2R:0] [N/A:N/A] Post Procedural Pain: [2R:Procedure was tolerated well] [N/A:N/A] Debridement Treatment Response: [2R:0.3x0.2x0.4] [N/A:N/A] Post Debridement Measurements L x W x D (cm) [1D:4.081] [N/A:N/A] Post Debridement Volume: (cm) [2R:Cellular or Tissue Based Product] [N/A:N/A] Procedures Performed: [2R:Debridement] Treatment Notes Electronic Signature(s) Signed: 09/18/2021 2:26:48 PM By: Kalman Shan DO Signed: 09/19/2021 12:25:45 PM By: Rhae Hammock RN Entered By: Kalman Shan on 09/18/2021 14:10:35 -------------------------------------------------------------------------------- Chapman Details Patient Name: Date of Service: [name] [name] ND, [name] 09/18/2021 12:45 PM Medical Record Number: [id number] Patient Account Number: [id number] Date of Birth/Sex: Treating RN: [birth year] (50 y.o. [name], [name] Primary Care Kayley Zeiders: [name] Other Clinician: Referring Sinai Mahany: Treating Jordyn Doane/Extender: Delano Metz in Treatment: 65 Multidisciplinary Care Plan reviewed with physician Active Inactive Wound/Skin Impairment Nursing Diagnoses: Impaired tissue integrity Goals: Patient/caregiver will verbalize understanding of skin care regimen Date Initiated: 11/19/2020 Target Resolution Date: 09/20/2021 Goal Status: Active Ulcer/skin breakdown will have a volume reduction of 30% by week 4 Date Initiated: 08/16/2020 Date Inactivated: 03/03/2021 Target Resolution Date: 03/10/2021 Goal Status: Met Interventions: Provide education on ulcer and skin care Notes: 07/24/21: Wound care regimen ongoing. Electronic Signature(s) Signed: 09/19/2021 12:25:45 PM By: Rhae Hammock RN Entered By: Rhae Hammock on 09/18/2021 13:50:09 -------------------------------------------------------------------------------- Pain Assessment Details Patient Name: Date of Service: [name] [name] ND, [name] 09/18/2021  12:45 PM Medical Record Number: [id number] Patient Account Number: [id number] Date of Birth/Sex: Treating RN: 04/12/1971 (50 y.o. [name], [name] Primary Care Tiasha Helvie: [name] Other Clinician: Referring Keaden Gunnoe: Treating Lior Cartelli/Extender: Delano Metz in Treatment: 75 Active Problems Location of Pain Severity and Description of Pain Patient Has Paino No Site Locations Pain Management and Medication Current Pain Management: Electronic Signature(s) Signed: 09/19/2021 12:25:45 PM By: Rhae Hammock RN Entered By: Rhae Hammock on 09/18/2021 13:11:30 -------------------------------------------------------------------------------- Patient/Caregiver Education Details Patient Name: Date of Service: [name] [name] 10/27/2022andnbsp12:45 PM Medical Record Number: [id number] Patient Account Number: [id number] Date of Birth/Gender: Treating RN: Jan 10, 1971 (50 y.o. [name] Primary Care Physician: [name] Other Clinician: Referring Physician: Treating Physician/Extender: Delano Metz in Treatment: 41 Education Assessment Education Provided To: Patient Education Topics Provided Wound/Skin Impairment: Methods: Explain/Verbal Responses: Reinforcements needed, State content correctly [employer]) Signed: 09/19/2021 12:25:45 PM By: Rhae Hammock RN Entered By: Rhae Hammock on 09/18/2021 14:09:23 -------------------------------------------------------------------------------- Wound Assessment Details Patient Name: Date of Service: [name] [name] ND, [name] 09/18/2021 12:45 PM Medical Record Number: [id number] Patient Account Number: [id number] Date of Birth/Sex: Treating RN: 02/25/1971 (50 y.o. [name], [name] Primary Care Rikia Sukhu: [name] Other Clinician: Referring Adriyanna Christians: Treating Prudence Heiny/Extender: Delano Metz in  Treatment: 100 Wound Status Wound Number: 2R Primary Diabetic Wound/Ulcer of the Lower Extremity Etiology: Wound Location: Left Metatarsal head first Wound Status: Open Wounding Event: Blister Comorbid Anemia, Hypertension, Peripheral Venous  Disease, Type II Date Acquired: 06/23/2020 History: Diabetes Weeks Of Treatment: 56 Clustered Wound: No Photos Wound Measurements Length: (cm) 0.3 Width: (cm) 0.2 Depth: (cm) 0.4 Area: (cm) 0.047 Volume: (cm) 0.019 % Reduction in Area: -51.6% % Reduction in Volume: -533.3% Epithelialization: None Tunneling: No Undermining: Yes Starting Position (o'clock): 12 Ending Position (o'clock): 12 Maximum Distance: (cm) 0.7 Wound Description Classification: Grade 2 Wound Margin: Well defined, not attached Exudate Amount: Medium Exudate Type: Serosanguineous Exudate Color: red, brown Foul Odor After Cleansing: No Slough/Fibrino No Wound Bed Granulation Amount: Large (67-100%) Exposed Structure Granulation Quality: Red Fascia Exposed: No Necrotic Amount: None Present (0%) Fat Layer (Subcutaneous Tissue) Exposed: Yes Tendon Exposed: No Muscle Exposed: No Joint Exposed: No Bone Exposed: No Treatment Notes Wound #2R (Metatarsal head first) Wound Laterality: Left Cleanser Soap and Water Discharge Instruction: May shower and wash wound with dial antibacterial soap and water prior to dressing change. Wound Cleanser Discharge Instruction: Cleanse the wound with wound cleanser prior to applying a clean dressing using gauze sponges, not tissue or cotton balls. Peri-Wound Care Sween Lotion (Moisturizing lotion) Discharge Instruction: Apply moisturizing lotion to leg Topical Primary Dressing Secondary Dressing Woven Gauze Sponges 2x2 in Discharge Instruction: Apply over primary dressing as directed. Optifoam Non-Adhesive Dressing, 4x4 in Discharge Instruction: cut to make foam donut to help offload ADAPTIC TOUCH 3x4.25 in Discharge  Instruction: Apply over primary dressing as directed. Secured With Conforming Stretch Gauze Bandage, Sterile 2x75 (in/in) Discharge Instruction: Secure with stretch gauze as directed. 69M Medipore H Soft Cloth Surgical T ape, 2x2 (in/yd) Discharge Instruction: Secure dressing with tape as directed. Compression Wrap Compression Stockings Add-Ons Electronic Signature(s) Signed: 09/18/2021 4:35:44 PM By: Lorrin Jackson Signed: 09/19/2021 12:25:45 PM By: Rhae Hammock RN Entered By: Lorrin Jackson on 09/18/2021 13:18:46 -------------------------------------------------------------------------------- Vitals Details Patient Name: Date of Service: [name], [name] 09/18/2021 12:45 PM Medical Record Number: [id number] Patient Account Number: [id number] Date of Birth/Sex: Treating RN: 1971-01-30 (50 y.o. [name], [name] Primary Care Asher Torpey: [name] Other Clinician: Referring Suriah Peragine: Treating Maveryck Bahri/Extender: Delano Metz in Treatment: 52 Vital Signs Time Taken: 13:10 Temperature (F): 97.8 Height (in): 77 Pulse (bpm): 108 Weight (lbs): 232 Respiratory Rate (breaths/min): 17 Body Mass Index (BMI): 27.5 Blood Pressure (mmHg): 134/73 Reference Range: 80 - 120 mg / dl Electronic Signature(s) Signed: 09/19/2021 12:25:45 PM By: Rhae Hammock RN Entered By: Rhae Hammock on 09/18/2021 13:11:23

## 2021-09-19 NOTE — Progress Notes (Signed)
SARTAJ, HOSKIN ([id number]) Visit Report for 09/18/2021 Chief Complaint Document Details Patient Name: Date of Service: [name] 09/18/2021 12:45 PM Medical Record Number: [id number] Patient Account Number: [id number] Date of Birth/Sex: Treating RN: [birth year] (50 y.o. [name], [name] Primary Care Provider: [name] Other Clinician: Referring Provider: Treating Provider/Extender: Delano Metz in Treatment: 55 Information Obtained from: Patient Chief Complaint patient is here for a review of the wound on his plantar left first metatarsal head and back of his right leg Electronic Signature(s) Signed: 09/18/2021 2:26:48 PM By: Kalman Shan DO Entered By: Kalman Shan on 09/18/2021 14:11:13 -------------------------------------------------------------------------------- Cellular or Tissue Based Product Details Patient Name: Date of Service: [name], [name] 09/18/2021 12:45 PM Medical Record Number: [id number] Patient Account Number: [id number] Date of Birth/Sex: Treating RN: [birth year] (50 y.o. [name], [name] Primary Care Provider: [name] Other Clinician: Referring Provider: Treating Provider/Extender: Delano Metz in Treatment: 81 Cellular or Tissue Based Product Type Wound #2R Left Metatarsal head first Applied to: Performed By: Physician Kalman Shan, DO Cellular or Tissue Based Product Type: Apligraf Level of Consciousness (Pre-procedure): Awake and Alert Pre-procedure Verification/Time Out Yes - 14:00 Taken: Location: genitalia / hands / feet / multiple digits Wound Size (sq cm): 0.06 Product Size (sq cm): 44 Waste Size (sq cm): 38 Waste Reason: wound size Amount of Product Applied (sq cm): 6 Instrument Used: Blade, Curette, Forceps, Scissors Lot #: [id number] Expiration Date: 09/27/2021 Fenestrated: Yes Instrument: Blade Reconstituted: Yes Solution  Type: ns Solution Amount: 5 ML Lot #: [id number] Solution Expiration Date: 07/24/2022 Secured: Yes Secured With: Steri-Strips Dressing Applied: Yes Primary Dressing: ADAPTIC Procedural Pain: 0 Post Procedural Pain: 0 Response to Treatment: Procedure was tolerated well Level of Consciousness (Post- Awake and Alert procedure): Post Procedure Diagnosis Same as Pre-procedure Electronic Signature(s) Signed: 09/18/2021 2:26:48 PM By: Kalman Shan DO Signed: 09/19/2021 12:25:45 PM By: Rhae Hammock RN Entered By: Rhae Hammock on 09/18/2021 14:08:49 -------------------------------------------------------------------------------- Debridement Details Patient Name: Date of Service: [name], [name] 09/18/2021 12:45 PM Medical Record Number: [id number] Patient Account Number: [id number] Date of Birth/Sex: Treating RN: 1970/12/14 (50 y.o. [name], [name] Primary Care Provider: [name] Other Clinician: Referring Provider: Treating Provider/Extender: Delano Metz in Treatment: 56 Debridement Performed for Assessment: Wound #2R Left Metatarsal head first Performed By: Physician Kalman Shan, DO Debridement Type: Debridement Severity of Tissue Pre Debridement: Fat layer exposed Level of Consciousness (Pre-procedure): Awake and Alert Pre-procedure Verification/Time Out Yes - 13:45 Taken: Start Time: 13:45 Pain Control: Lidocaine T Area Debrided (L x W): otal 0.3 (cm) x 0.2 (cm) = 0.06 (cm) Tissue and other material debrided: Callus, Subcutaneous, Skin: Dermis , Skin: Epidermis Level: Skin/Subcutaneous Tissue Debridement Description: Excisional Instrument: Curette Bleeding: Minimum Hemostasis Achieved: Pressure End Time: 13:45 Procedural Pain: 0 Post Procedural Pain: 0 Response to Treatment: Procedure was tolerated well Level of Consciousness (Post- Awake and Alert procedure): Post Debridement Measurements of Total  Wound Length: (cm) 0.3 Width: (cm) 0.2 Depth: (cm) 0.4 Volume: (cm) 0.019 Character of Wound/Ulcer Post Debridement: Improved Severity of Tissue Post Debridement: Fat layer exposed Post Procedure Diagnosis Same as Pre-procedure Electronic Signature(s) Signed: 09/18/2021 2:26:48 PM By: Kalman Shan DO Signed: 09/19/2021 12:25:45 PM By: Rhae Hammock RN Entered By: Rhae Hammock on 09/18/2021 13:47:04 -------------------------------------------------------------------------------- HPI Details Patient Name: Date of Service: [name], Cadwell L. 09/18/2021 12:45 PM Medical Record Number: [id number] Patient Account Number: [id number] Date of Birth/Sex: Treating RN: 09-Oct-1971 (50  y.o. [name] Primary Care Provider: [name] Other Clinician: Referring Provider: Treating Provider/Extender: Delano Metz in Treatment: 6 History of Present Illness HPI Description: 04/21/18 ADMISSION This is a 50 year old man who is a type II diabetic. In spite of fact his hemoglobin A1c is actually quite good 5.73 months ago he is at a really difficult time over the last 6-7 months. He developed a rapidly progressive infection in the right foot in November 2018 associated with osteomyelitis and necrotizing fasciitis. 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 The stump required and a revision on 11/24/17. The stump revision was advertised as being secondary to falls. I'm not sure the progressive history here however this area is actually closed over. The patient tells me over the same timeframe he has had wounds on the plantar aspect of his left foot. In the ED saw Dr. Sharol Given in April of this year. 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 It is really not clear that this patient is been dressing this with anything. He came in with the clinic without any specific dressing on the wound areas using his own tennis  shoe. He tells me that he only ambulates of course to do a pivot transfer. He does not have his prosthesis for the right leg as of yet and he blames Medicaid for this. He does however use the foot to push himself along in his wheelchair at home. The patient has not had formal arterial studies. ABI in our clinic on the left was 1.13. Patient's past medical history includes type 2 diabetes, fracture of the right fibula. I see that he was treated for abscesses on his right buttock and chest in 2016. I did not look at the microbiology of this. 04/28/18; patient comes back in the clinic today with the wound pretty much the same as when he came in here last week. Small opening lots of undermining relatively. He tells [place] that nothing is really been on this for 3 days in spite of the fact that we gave him enough to dress this easily especially such a small wound. He says he lives with his mother, she is not capable of assisting with this he is changing the dressings himself. 05/05/18; much better-looking wound today. Smaller. There is some undermining medially however that's only perhaps 2 mm. No surrounding erythema. We've been using silver collagen 05/12/18; small wound on the first metatarsal head. No undermining no surrounding erythema. We've been using silver collagen he has home health coming out to change 05/20/18 the wound on the first metatarsal head looks better. Covered in surface debris/callus nonviable tissue. Required debridement but post debridement this looks quite good. We've been using silver collagen. He has home health 05/27/18; first metatarsal head wound continues to improve. Just about completely closed. Still a lot of surface debridement callus. We've been using silver collagen 06/06/18; the first metatarsal wound is completely healed over. There is still a lot of callus. I gently removed some of this just to make sure that there was no open area and there is not. The patient mentions to me  that his left leg has felt like "lead" for about a week READMISSION 08/16/2020 This is a 50 year old man with type 2 diabetes. He returns to clinic today with a 1 month history of a blister and callus over the first metatarsal head. This is in exactly the same area as when he was here in  2019. 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 He has standard running shoes on the left foot to match the area in his prosthesis. He has only been covering this area with a Band-Aid has not really been specifically dressing this. ABI in our clinic on the left was 1.16. This is essentially stable from the value in 2019 10/1 the patient has a linear wound over the left first metatarsal head. Again not a lot of callus thick skin around this that I removed with a #3 curette. This is not go deep to bone or muscle it does not look to be infected. 10/8 first metatarsal head. The wound looks like it is closing however this is going to be a difficult area to offload in the future. He has a size 15 shoe and he says his shoes are years old. The inserts in these current shoes are totally useless and I have advised him to replace these. He may need a new pair of shoes and I have he got original prosthesis at [facility] [place] I have asked him to go back there. 11/9; the patient has not been here in more than a month. Not sure he was healed when he was here the last time. I told him he would have to get new shoes and certainly offload the area over the left first metatarsal head. He has done nothing of the kind. He arrives back in clinic with the same old new balance sneakers. A very large separating callus over the first metatarsal head on the left 11/16; he has purchased new shoes and has at least 2 insoles like I asked. The wound is measuring much smaller. He is using silver alginate he changes the dressing himself 11/30; wound is measurably smaller in length of about a half  a centimeter. This is generally very little depth. We have been using silver alginate. He changes the dressing himself every second day. 12/14; wound is about the same size. Significant undermining medially relative to the size of the wound. We have been using silver alginate. There is not a way to offload this area as he walks with a prosthesis on the right leg 12/28; wound is about the same size. Again he has undermining medially. I am using polymen on this 12/02/2020; the wound looks smaller. Still a senescent edge. We have been using polymen 1/24; the wound is come down a few millimeters. Still a senescent edge. I have been using polymen 2/8; the wound is come down slightly. Still with slight undermining from 12-6 o'clock I have been using polymen partially to get offloading on this area which is opposed by his prosthesis on the other leg. 2/22 quite open improvement he still has a comma shaped wound on the left first metatarsal head. Some depth. Using polymen 3/14; he still has the same problem of a comma shaped area over the left first metatarsal head. This seems to have had more depth today at 0.6 cm. We have been using polymen. The patient changes this himself every second day. I explored the idea of a total contact cast on the left leg however this is his driving foot. He was not enamored with the idea of not being able to drive and states that he does not have anybody else to get him around 3/28; again he comes in with a smaller looking wound however there is depth and undermining requiring debridement. We have been using  Hydrofera Blue, changed to silver collagen today. The patient is doing the dressing himself 4/11; patient presents today for follow-up of his left diabetic foot wound. He denies any issues in the past 2 weeks. He is currently using silver collagen every other day with dressing changes. He does not use diabetic shoes or special inserts to his sneakers. He does not use felt  pads for offloading. 4/19; patient presents for his 1 week follow-up. He denies any issues. He reports using Hydrofera Blue and has not been using silver collagen for dressing changes. He reports minimal drainage to the wound. He overall feels well. 5/3; patient presents for 2-week follow-up. He has been using silver collagen without issues. He has no complaints today and states he overall feels well. 5/17; patient presents for 2-week follow-up. He states that the sleeve is rubbing up against the back of his right leg and causing a wound. He states this happened a week ago and has not been dressing the wound. The plantar wound is stable and he has been using collagen every other day. 5/24; Patient was had a recent hospitalization for cellulitis of his right popliteal fossa. He was admitted and started on IV antibiotics. He does not recall the plantar wound dressed while inpatient. He feels a lot better today. He was discharged on 5/21 on oral antibiotics and has not picked this up until today. He reports using collagen to the plantar wound since discharge. He is keeping the right posterior knee wound covered. He is getting a new sleeve for his prostatic on 5/26. He currently denies signs of infection. 6/1; patient presents for 1 week follow-up. He is still on doxycycline. He has not been dressing the back of the right knee wound. He states he is receiving his new prosthetic sleeve tomorrow. He states he is using silver alginate to the plantar foot wound. He currently denies signs of infection. He states he has not been offloading the left foot wound 6/7; patient presents for 1 week follow-up. He completes his last dose of doxycycline today. He finally received his prosthetic sleeve for the right side and he states it feels much better. He has been using antibiotic ointment to the back of that right knee wound. He has been using silver alginate to the plantar foot wound. He currently denies signs of  infection. 6/21; patient presents for 2-week follow-up. He reports no issues with his prosthetic sleeve. He has been using antibiotic ointment to the back of the right knee. He has been using silver alginate to the plantar foot wound. He has no issues or complaints today. He denies signs of infection. 7/5; patient presents for 2-week follow-up. He reports no issues or complaints today. He continues to use antibiotic ointment to the right posterior knee wound and silver alginate to the left plantar foot wound. He denies signs of infection. 7/19; patient presents for 2-week follow-up. He continues to use antibiotic ointment to the right posterior knee and silver alginate to the left plantar foot wound. He has no issues or complaints today. He denies signs of infection. 8/4; patient presents for 2-week follow-up. He has no issues or complaints today. He denies signs of infection. 8/18; patient presents for 2-week follow-up. He has no issues or concerns today. He has been approved for Apligraf and he would like to have this placed today. He denies signs of infection and reports that his right posterior knee wound is closed. 8/25; patient presents for 1 week follow-up. He had no issues  with his first Apligraf placement last week. He did however develop an abscess to the back of his right knee. He reports tenderness to this area. He denies systemic signs of infection. 9/1; patient presents for 1 week follow-up. He has had no issues with his plantar foot wound. The Apligraf has stayed in place over the past week. A wound culture was done at last clinic visit and a new antibiotic was sent to the pharmacy. Patient has not received this yet. He reports some mild tenderness to the back of his right knee. He denies systemic signs of infection. 9/8; patient presents for 1 week follow-up. He states he took Keflex and had diarrhea as a side effect. He was able to complete the course. He reports continued tenderness  to the right posterior knee wound. He has some drainage still. He denies increased warmth or erythema to the area. He denies fever/chills or nausea/vomiting. He has no issues or complaints today about his plantar foot wound. 9/15; patient presents for follow-up. He reports improvement in pain to the back of his right leg. He does not report any drainage. He had an ultrasound completed on 9/9 that showed a superficial fluid collection concerning for abscess. We attempted to call patient with results with no answer. We did put an urgent referral to general surgery and he reports he has not received a call. He currently denies systemic signs of infection. He reports no issues to his left plantar foot wound. 9/27; patient presents for follow-up. He has no issues or complaints today. He has had no issues with the previous wound to the back of his right leg. He has not made an appointment to see general surgery. He had Apligraf placed at last clinic visit to the plantar foot wound. He currently denies signs of infection. 10/7; patient presents for follow-up. Upon entering the room there is vomit throughout the entire floor. Patient states he does not feel well. I recommended he go to urgent care. I recommended following up next week for wound care. 10/10; patient presents for follow-up. He came last week for his follow-up however he was ill and started vomiting in the exam room. Today he states he feels better however he continues to have vomiting episodes. He states he attempted to go to urgent care/the ED but could not find a parking spot. He subsequently went home. He has no issues or complaints today regarding the foot wound. He never followed up with general surgery for the abscess identified on ultrasound to the back of his right knee. 10/20; patient presents for follow-up. He has no issues or complaints today. Overall he feels well today. 10/27; patient presents for follow-up. He has no issues or  complaints today. Electronic Signature(s) Signed: 09/18/2021 2:26:48 PM By: Kalman Shan DO Entered By: Kalman Shan on 09/18/2021 14:11:37 -------------------------------------------------------------------------------- Physical Exam Details Patient Name: Date of Service: [name], [name] 09/18/2021 12:45 PM Medical Record Number: [id number] Patient Account Number: [id number] Date of Birth/Sex: Treating RN: 10/19/1971 (50 y.o. [name] Primary Care Provider: [name] Other Clinician: Referring Provider: Treating Provider/Extender: Delano Metz in Treatment: 12 Constitutional respirations regular, non-labored and within target range for patient.. Cardiovascular 2+ dorsalis pedis/posterior tibialis pulses. Psychiatric pleasant and cooperative. Notes Left foot wound with circumferential callus and nonviable tissue with undermining. Post debridement there is minimal undermining with granulation tissue present. No signs of infection. Electronic Signature(s) Signed: 09/18/2021 2:26:48 PM By: Kalman Shan DO Entered By: Kalman Shan on  09/18/2021 14:13:29 -------------------------------------------------------------------------------- Physician Orders Details Patient Name: Date of Service: [name] 09/18/2021 12:45 PM Medical Record Number: [id number] Patient Account Number: [id number] Date of Birth/Sex: Treating RN: 1971/01/05 (50 y.o. [name] Primary Care Provider: [name] Other Clinician: Referring Provider: Treating Provider/Extender: Delano Metz in Treatment: 43 Verbal / Phone Orders: No Diagnosis Coding Follow-up Appointments ppointment in 1 week. - with Dr. Heber [place] (Thurs/Fri) Return A Other: - BE on the lookout for a call from Orthopedic surgery   they will be calling to schedule an appt. Cellular or Tissue Based Products Cellular or Tissue Based  Product Type: - Apligraf #9 Edema Control - Lymphedema / SCD / Other Elevate legs to the level of the heart or above for 30 minutes daily and/or when sitting, a frequency of: Avoid standing for long periods of time. Wound Treatment Wound #2R - Metatarsal head first Wound Laterality: Left Cleanser: Soap and Water 1 x Per Week/15 Days Discharge Instructions: May shower and wash wound with dial antibacterial soap and water prior to dressing change. Cleanser: Wound Cleanser (Generic) 1 x Per Week/15 Days Discharge Instructions: Cleanse the wound with wound cleanser prior to applying a clean dressing using gauze sponges, not tissue or cotton balls. Peri-Wound Care: Sween Lotion (Moisturizing lotion) 1 x Per Week/15 Days Discharge Instructions: Apply moisturizing lotion to leg Secondary Dressing: Woven Gauze Sponges 2x2 in (Generic) 1 x Per Week/15 Days Discharge Instructions: Apply over primary dressing as directed. Secondary Dressing: Optifoam Non-Adhesive Dressing, 4x4 in (Generic) 1 x Per Week/15 Days Discharge Instructions: cut to make foam donut to help offload Secondary Dressing: ADAPTIC TOUCH 3x4.25 in 1 x Per Week/15 Days Discharge Instructions: Apply over primary dressing as directed. Secured With: Child psychotherapist, Sterile 2x75 (in/in) (Generic) 1 x Per Week/15 Days Discharge Instructions: Secure with stretch gauze as directed. Secured With: 66M Medipore H Soft Cloth Surgical Tape, 2x2 (in/yd) (Generic) 1 x Per Week/15 Days Discharge Instructions: Secure dressing with tape as directed. Electronic Signature(s) Signed: 09/18/2021 2:26:48 PM By: Kalman Shan DO Entered By: Kalman Shan on 09/18/2021 14:21:04 -------------------------------------------------------------------------------- Problem List Details Patient Name: Date of Service: [name], [name] 09/18/2021 12:45 PM Medical Record Number: [id number] Patient Account Number: [id number] Date of  Birth/Sex: Treating RN: [birth year] (50 y.o. [name], [name] Primary Care Provider: [name] Other Clinician: Referring Provider: Treating Provider/Extender: Delano Metz in Treatment: 24 Active Problems ICD-10 Encounter Code Description Active Date MDM Diagnosis [phone] Non-pressure chronic ulcer of other part of left foot with fat layer exposed 08/16/2020 No Yes E11.621 Type 2 diabetes mellitus with foot ulcer 08/16/2020 No Yes S81.801D Unspecified open wound, right lower leg, subsequent encounter 04/08/2021 No Yes E11.42 Type 2 diabetes mellitus with diabetic polyneuropathy 08/16/2020 No Yes Z89.511 Acquired absence of right leg below knee 08/16/2020 No Yes Inactive Problems Resolved Problems Electronic Signature(s) Signed: 09/18/2021 2:26:48 PM By: Kalman Shan DO Entered By: Kalman Shan on 09/18/2021 14:10:28 -------------------------------------------------------------------------------- Progress Note Details Patient Name: Date of Service: [name], [name] 09/18/2021 12:45 PM Medical Record Number: [id number] Patient Account Number: [id number] Date of Birth/Sex: Treating RN: Nov 22, 1971 (50 y.o. [name], [name] Primary Care Provider: [name] Other Clinician: Referring Provider: Treating Provider/Extender: Delano Metz in Treatment: 33 Subjective Chief Complaint Information obtained from Patient patient is here for a review of the wound on his plantar left first metatarsal head and back of his right leg History of Present Illness (  HPI) 04/21/18 ADMISSION This is a 50 year old man who is a type II diabetic. In spite of fact his hemoglobin A1c is actually quite good 5.73 months ago he is at a really difficult time over the last 6-7 months. He developed a rapidly progressive infection in the right foot in November 2018 associated with osteomyelitis and necrotizing fasciitis. He had a  right BKA on November 21 18. The stump required and a revision on 11/24/17. The stump revision was advertised as being secondary to falls. I'm not sure the progressive history here however this area is actually closed over. The patient tells me over the same timeframe he has had wounds on the plantar aspect of his left foot. In the ED saw Dr. Sharol Given in April of this year. 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 It is really not clear that this patient is been dressing this with anything. He came in with the clinic without any specific dressing on the wound areas using his own tennis shoe. He tells me that he only ambulates of course to do a pivot transfer. He does not have his prosthesis for the right leg as of yet and he blames Medicaid for this. He does however use the foot to push himself along in his wheelchair at home. The patient has not had formal arterial studies. ABI in our clinic on the left was 1.13. Patient's past medical history includes type 2 diabetes, fracture of the right fibula. I see that he was treated for abscesses on his right buttock and chest in 2016. I did not look at the microbiology of this. 04/28/18; patient comes back in the clinic today with the wound pretty much the same as when he came in here last week. Small opening lots of undermining relatively. He tells [place] that nothing is really been on this for 3 days in spite of the fact that we gave him enough to dress this easily especially such a small wound. He says he lives with his mother, she is not capable of assisting with this he is changing the dressings himself. 05/05/18; much better-looking wound today. Smaller. There is some undermining medially however that's only perhaps 2 mm. No surrounding erythema. We've been using silver collagen 05/12/18; small wound on the first metatarsal head. No undermining no surrounding erythema. We've been using silver collagen he has home  health coming out to change 05/20/18 the wound on the first metatarsal head looks better. Covered in surface debris/callus nonviable tissue. Required debridement but post debridement this looks quite good. We've been using silver collagen. He has home health 05/27/18; first metatarsal head wound continues to improve. Just about completely closed. Still a lot of surface debridement callus. We've been using silver collagen 06/06/18; the first metatarsal wound is completely healed over. There is still a lot of callus. I gently removed some of this just to make sure that there was no open area and there is not. The patient mentions to me that his left leg has felt like "lead" for about a week READMISSION 08/16/2020 This is a 50 year old man with type 2 diabetes. He returns to clinic today with a 1 month history of a blister and callus over the first metatarsal head. This is in exactly the same area as when he was here in 2019. 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 He has standard running shoes on the left foot  to match the area in his prosthesis. He has only been covering this area with a Band-Aid has not really been specifically dressing this. ABI in our clinic on the left was 1.16. This is essentially stable from the value in 2019 10/1 the patient has a linear wound over the left first metatarsal head. Again not a lot of callus thick skin around this that I removed with a #3 curette. This is not go deep to bone or muscle it does not look to be infected. 10/8 first metatarsal head. The wound looks like it is closing however this is going to be a difficult area to offload in the future. He has a size 15 shoe and he says his shoes are years old. The inserts in these current shoes are totally useless and I have advised him to replace these. He may need a new pair of shoes and I have he got original prosthesis at [facility] the right I have asked him to go  back there. 11/9; the patient has not been here in more than a month. Not sure he was healed when he was here the last time. I told him he would have to get new shoes and certainly offload the area over the left first metatarsal head. He has done nothing of the kind. He arrives back in clinic with the same old new balance sneakers. A very large separating callus over the first metatarsal head on the left 11/16; he has purchased new shoes and has at least 2 insoles like I asked. The wound is measuring much smaller. He is using silver alginate he changes the dressing himself 11/30; wound is measurably smaller in length of about a half a centimeter. This is generally very little depth. We have been using silver alginate. He changes the dressing himself every second day. 12/14; wound is about the same size. Significant undermining medially relative to the size of the wound. We have been using silver alginate. There is not a way to offload this area as he walks with a prosthesis on the right leg 12/28; wound is about the same size. Again he has undermining medially. I am using polymen on this 12/02/2020; the wound looks smaller. Still a senescent edge. We have been using polymen 1/24; the wound is come down a few millimeters. Still a senescent edge. I have been using polymen 2/8; the wound is come down slightly. Still with slight undermining from 12-6 o'clock I have been using polymen partially to get offloading on this area which is opposed by his prosthesis on the other leg. 2/22 quite open improvement he still has a comma shaped wound on the left first metatarsal head. Some depth. Using polymen 3/14; he still has the same problem of a comma shaped area over the left first metatarsal head. This seems to have had more depth today at 0.6 cm. We have been using polymen. The patient changes this himself every second day. I explored the idea of a total contact cast on the left leg however this is his  driving foot. He was not enamored with the idea of not being able to drive and states that he does not have anybody else to get him around 3/28; again he comes in with a smaller looking wound however there is depth and undermining requiring debridement. We have been using Hydrofera Blue, changed to silver collagen today. The patient is doing the dressing himself 4/11; patient presents today for follow-up of his left diabetic foot wound. He  denies any issues in the past 2 weeks. He is currently using silver collagen every other day with dressing changes. He does not use diabetic shoes or special inserts to his sneakers. He does not use felt pads for offloading. 4/19; patient presents for his 1 week follow-up. He denies any issues. He reports using Hydrofera Blue and has not been using silver collagen for dressing changes. He reports minimal drainage to the wound. He overall feels well. 5/3; patient presents for 2-week follow-up. He has been using silver collagen without issues. He has no complaints today and states he overall feels well. 5/17; patient presents for 2-week follow-up. He states that the sleeve is rubbing up against the back of his right leg and causing a wound. He states this happened a week ago and has not been dressing the wound. The plantar wound is stable and he has been using collagen every other day. 5/24; Patient was had a recent hospitalization for cellulitis of his right popliteal fossa. He was admitted and started on IV antibiotics. He does not recall the plantar wound dressed while inpatient. He feels a lot better today. He was discharged on 5/21 on oral antibiotics and has not picked this up until today. He reports using collagen to the plantar wound since discharge. He is keeping the right posterior knee wound covered. He is getting a new sleeve for his prostatic on 5/26. He currently denies signs of infection. 6/1; patient presents for 1 week follow-up. He is still on  doxycycline. He has not been dressing the back of the right knee wound. He states he is receiving his new prosthetic sleeve tomorrow. He states he is using silver alginate to the plantar foot wound. He currently denies signs of infection. He states he has not been offloading the left foot wound 6/7; patient presents for 1 week follow-up. He completes his last dose of doxycycline today. He finally received his prosthetic sleeve for the right side and he states it feels much better. He has been using antibiotic ointment to the back of that right knee wound. He has been using silver alginate to the plantar foot wound. He currently denies signs of infection. 6/21; patient presents for 2-week follow-up. He reports no issues with his prosthetic sleeve. He has been using antibiotic ointment to the back of the right knee. He has been using silver alginate to the plantar foot wound. He has no issues or complaints today. He denies signs of infection. 7/5; patient presents for 2-week follow-up. He reports no issues or complaints today. He continues to use antibiotic ointment to the right posterior knee wound and silver alginate to the left plantar foot wound. He denies signs of infection. 7/19; patient presents for 2-week follow-up. He continues to use antibiotic ointment to the right posterior knee and silver alginate to the left plantar foot wound. He has no issues or complaints today. He denies signs of infection. 8/4; patient presents for 2-week follow-up. He has no issues or complaints today. He denies signs of infection. 8/18; patient presents for 2-week follow-up. He has no issues or concerns today. He has been approved for Apligraf and he would like to have this placed today. He denies signs of infection and reports that his right posterior knee wound is closed. 8/25; patient presents for 1 week follow-up. He had no issues with his first Apligraf placement last week. He did however develop an abscess to  the back of his right knee. He reports tenderness to this area. He  denies systemic signs of infection. 9/1; patient presents for 1 week follow-up. He has had no issues with his plantar foot wound. The Apligraf has stayed in place over the past week. A wound culture was done at last clinic visit and a new antibiotic was sent to the pharmacy. Patient has not received this yet. He reports some mild tenderness to the back of his right knee. He denies systemic signs of infection. 9/8; patient presents for 1 week follow-up. He states he took Keflex and had diarrhea as a side effect. He was able to complete the course. He reports continued tenderness to the right posterior knee wound. He has some drainage still. He denies increased warmth or erythema to the area. He denies fever/chills or nausea/vomiting. He has no issues or complaints today about his plantar foot wound. 9/15; patient presents for follow-up. He reports improvement in pain to the back of his right leg. He does not report any drainage. He had an ultrasound completed on 9/9 that showed a superficial fluid collection concerning for abscess. We attempted to call patient with results with no answer. We did put an urgent referral to general surgery and he reports he has not received a call. He currently denies systemic signs of infection. He reports no issues to his left plantar foot wound. 9/27; patient presents for follow-up. He has no issues or complaints today. He has had no issues with the previous wound to the back of his right leg. He has not made an appointment to see general surgery. He had Apligraf placed at last clinic visit to the plantar foot wound. He currently denies signs of infection. 10/7; patient presents for follow-up. Upon entering the room there is vomit throughout the entire floor. Patient states he does not feel well. I recommended he go to urgent care. I recommended following up next week for wound care. 10/10; patient  presents for follow-up. He came last week for his follow-up however he was ill and started vomiting in the exam room. Today he states he feels better however he continues to have vomiting episodes. He states he attempted to go to urgent care/the ED but could not find a parking spot. He subsequently went home. He has no issues or complaints today regarding the foot wound. He never followed up with general surgery for the abscess identified on ultrasound to the back of his right knee. 10/20; patient presents for follow-up. He has no issues or complaints today. Overall he feels well today. 10/27; patient presents for follow-up. He has no issues or complaints today. Patient History Information obtained from Patient. Family History Diabetes - Maternal Grandparents,Mother, Heart Disease - Father, Hypertension - Father, No family history of Cancer, Hereditary Spherocytosis, Kidney Disease, Lung Disease, Seizures, Stroke, Thyroid Problems, Tuberculosis. Social History Current every day smoker, Marital Status - Single, Alcohol Use - Never, Drug Use - No History, Caffeine Use - Never. Medical History Eyes Denies history of Cataracts, Glaucoma, Optic Neuritis Ear/Nose/Mouth/Throat Denies history of Chronic sinus problems/congestion, Middle ear problems Hematologic/Lymphatic Patient has history of Anemia Denies history of Hemophilia, Human Immunodeficiency Virus, Lymphedema, Sickle Cell Disease Respiratory Denies history of Aspiration, Asthma, Chronic Obstructive Pulmonary Disease (COPD), Pneumothorax, Sleep Apnea, Tuberculosis Cardiovascular Patient has history of Hypertension, Peripheral Venous Disease Denies history of Angina, Arrhythmia, Congestive Heart Failure, Coronary Artery Disease, Deep Vein Thrombosis, Hypotension, Myocardial Infarction, Peripheral Arterial Disease, Phlebitis, Vasculitis Gastrointestinal Denies history of Cirrhosis , Colitis, Crohnoos, Hepatitis A, Hepatitis B, Hepatitis  C Endocrine Patient has history of  Type II Diabetes - oral meds Denies history of Type I Diabetes Genitourinary Denies history of End Stage Renal Disease Immunological Denies history of Lupus Erythematosus, Raynaudoos, Scleroderma Integumentary (Skin) Denies history of History of Burn Musculoskeletal Denies history of Gout, Rheumatoid Arthritis, Osteoarthritis, Osteomyelitis Neurologic Denies history of Dementia, Neuropathy, Quadriplegia, Paraplegia, Seizure Disorder Oncologic Denies history of Received Chemotherapy, Received Radiation Psychiatric Denies history of Anorexia/bulimia, Confinement Anxiety Hospitalization/Surgery History - fever 105, sepsis cellulitis right popliteal fossa 04/09/2021. Medical A Surgical History Notes nd Constitutional Symptoms (General Health) falls , leukocytosis Respiratory During waking hours and catches himself not breathing, "gasp for air". Saw Dr Wynonia Lawman, he couldn't find a reason Objective Constitutional respirations regular, non-labored and within target range for patient.. Vitals Time Taken: 1:10 PM, Height: 77 in, Weight: 232 lbs, BMI: 27.5, Temperature: 97.8 F, Pulse: 108 bpm, Respiratory Rate: 17 breaths/min, Blood Pressure: 134/73 mmHg. Cardiovascular 2+ dorsalis pedis/posterior tibialis pulses. Psychiatric pleasant and cooperative. General Notes: Left foot wound with circumferential callus and nonviable tissue with undermining. Post debridement there is minimal undermining with granulation tissue present. No signs of infection. Integumentary (Hair, Skin) Wound #2R status is Open. Original cause of wound was Blister. The date acquired was: 06/23/2020. The wound has been in treatment 56 weeks. The wound is located on the Left Metatarsal head first. The wound measures 0.3cm length x 0.2cm width x 0.4cm depth; 0.047cm^2 area and 0.019cm^3 volume. There is Fat Layer (Subcutaneous Tissue) exposed. There is no tunneling noted, however, there  is undermining starting at 12:00 and ending at 12:00 with a maximum distance of 0.7cm. There is a medium amount of serosanguineous drainage noted. The wound margin is well defined and not attached to the wound base. There is large (67-100%) red granulation within the wound bed. There is no necrotic tissue within the wound bed. 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 I debrided nonviable tissue. There is minimal undermining now. No signs of infection on exam. Apligraf #9 was placed in standard fashion today. I do think at this time I would like to switch to Affinity due to the wound size. I would also like to obtain Pegasys insoles for the patient. Follow- up in 1 week. Procedures Wound #2R Pre-procedure diagnosis of Wound #2R is a Diabetic Wound/Ulcer of the Lower Extremity located on the Left Metatarsal head first .Severity of Tissue Pre Debridement is: Fat layer exposed. There was a Excisional Skin/Subcutaneous Tissue Debridement with a total area of 0.06 sq cm performed by Kalman Shan, DO. With the following instrument(s): Curette Material removed includes Callus, Subcutaneous Tissue, Skin: Dermis, and Skin: Epidermis after achieving pain control using Lidocaine. No specimens were taken. A time out was conducted at 13:45, prior to the start of the procedure. A Minimum amount of bleeding was controlled with Pressure. The procedure was tolerated well with a pain level of 0 throughout and a pain level of 0 following the procedure. Post Debridement Measurements: 0.3cm length x 0.2cm width x 0.4cm depth; 0.019cm^3 volume. Character of Wound/Ulcer Post Debridement is improved. Severity of Tissue Post  Debridement is: Fat layer exposed. Post procedure Diagnosis Wound #2R: Same as Pre-Procedure Pre-procedure diagnosis of Wound #2R is a Diabetic Wound/Ulcer of the Lower Extremity located on the Left Metatarsal head first.  A skin graft procedure using a bioengineered skin substitute/cellular or tissue based product was performed by Kalman Shan, DO with the following instrument(s): Blade, Curette, Forceps, and Scissors. Apligraf was applied and secured with Steri-Strips. 6 sq cm of product was utilized and 38 sq cm was wasted due to wound size. Post Application, ADAPTIC was applied. A Time Out was conducted at 14:00, prior to the start of the procedure. The procedure was tolerated well with a pain level of 0 throughout and a pain level of 0 following the procedure. Post procedure Diagnosis Wound #2R: Same as Pre-Procedure . Plan Follow-up Appointments: Return Appointment in 1 week. - with Dr. Heber Kanorado (Thurs/Fri) Other: - BE on the lookout for a call from Orthopedic surgery   they will be calling to schedule an appt. Cellular or Tissue Based Products: Cellular or Tissue Based Product Type: - Apligraf #9 Edema Control - Lymphedema / SCD / Other: Elevate legs to the level of the heart or above for 30 minutes daily and/or when sitting, a frequency of: Avoid standing for long periods of time. WOUND #2R: - Metatarsal head first Wound Laterality: Left Cleanser: Soap and Water 1 x Per Week/15 Days Discharge Instructions: May shower and wash wound with dial antibacterial soap and water prior to dressing change. Cleanser: Wound Cleanser (Generic) 1 x Per Week/15 Days Discharge Instructions: Cleanse the wound with wound cleanser prior to applying a clean dressing using gauze sponges, not tissue or cotton balls. Peri-Wound Care: Sween Lotion (Moisturizing lotion) 1 x Per Week/15 Days Discharge Instructions: Apply moisturizing lotion to leg Secondary Dressing: Woven Gauze Sponges 2x2 in (Generic) 1 x  Per Week/15 Days Discharge Instructions: Apply over primary dressing as directed. Secondary Dressing: Optifoam Non-Adhesive Dressing, 4x4 in (Generic) 1 x Per Week/15 Days Discharge Instructions: cut to make foam donut to help offload Secondary Dressing: ADAPTIC TOUCH 3x4.25 in 1 x Per Week/15 Days Discharge Instructions: Apply over primary dressing as directed. Secured With: Child psychotherapist, Sterile 2x75 (in/in) (Generic) 1 x Per Week/15 Days Discharge Instructions: Secure with stretch gauze as directed. Secured With: 33M Medipore H Soft Cloth Surgical T ape, 2x2 (in/yd) (Generic) 1 x Per Week/15 Days Discharge Instructions: Secure dressing with tape as directed. 1. In office sharp debridement 2. Apligraf #9 placed in standard fashion 3. Follow-up in 1 week Electronic Signature(s) Signed: 09/18/2021 2:26:48 PM By: Kalman Shan DO Entered By: Kalman Shan on 09/18/2021 14:26:10 -------------------------------------------------------------------------------- HxROS Details Patient Name: Date of Service: [name], [name] 09/18/2021 12:45 PM Medical Record Number: [id number] Patient Account Number: [id number] Date of Birth/Sex: Treating RN: [birth year] (50 y.o. [name] Primary Care Provider: [name] Other Clinician: Referring Provider: Treating Provider/Extender: Delano Metz in Treatment: 28 Information Obtained From Patient Constitutional Symptoms (General Health) Medical History: Past Medical History Notes: falls , leukocytosis Eyes Medical History: Negative for: Cataracts; Glaucoma; Optic Neuritis Ear/Nose/Mouth/Throat Medical History: Negative for: Chronic sinus problems/congestion; Middle ear problems Hematologic/Lymphatic Medical History: Positive for: Anemia Negative for: Hemophilia; Human Immunodeficiency Virus; Lymphedema; Sickle Cell Disease Respiratory Medical History: Negative for:  Aspiration; Asthma; Chronic Obstructive Pulmonary Disease (COPD); Pneumothorax; Sleep Apnea; Tuberculosis Past Medical History Notes: During waking hours and catches himself not breathing, "gasp for air". Saw Dr Wynonia Lawman, he couldn't find a reason Cardiovascular Medical History: Positive for: Hypertension; Peripheral Venous Disease Negative for: Angina; Arrhythmia; Congestive Heart Failure; Coronary Artery Disease; Deep Vein Thrombosis; Hypotension; Myocardial Infarction; Peripheral Arterial Disease; Phlebitis; Vasculitis Gastrointestinal Medical History: Negative for: Cirrhosis ;  Colitis; Crohns; Hepatitis A; Hepatitis B; Hepatitis C Endocrine Medical History: Positive for: Type II Diabetes - oral meds Negative for: Type I Diabetes Time with diabetes: 2014 Treated with: Oral agents Blood sugar tested every day: Yes Tested : Blood sugar testing results: Breakfast: 91 Genitourinary Medical History: Negative for: End Stage Renal Disease Immunological Medical History: Negative for: Lupus Erythematosus; Raynauds; Scleroderma Integumentary (Skin) Medical History: Negative for: History of Burn Musculoskeletal Medical History: Negative for: Gout; Rheumatoid Arthritis; Osteoarthritis; Osteomyelitis Neurologic Medical History: Negative for: Dementia; Neuropathy; Quadriplegia; Paraplegia; Seizure Disorder Oncologic Medical History: Negative for: Received Chemotherapy; Received Radiation Psychiatric Medical History: Negative for: Anorexia/bulimia; Confinement Anxiety Immunizations Pneumococcal Vaccine: Received Pneumococcal Vaccination: No Implantable Devices No devices added Hospitalization / Surgery History Type of Hospitalization/Surgery fever 105, sepsis cellulitis right popliteal fossa 04/09/2021 Family and Social History Cancer: No; Diabetes: Yes - Maternal Grandparents,Mother; Heart Disease: Yes - Father; Hereditary Spherocytosis: No; Hypertension: Yes - Father;  Kidney Disease: No; Lung Disease: No; Seizures: No; Stroke: No; Thyroid Problems: No; Tuberculosis: No; Current every day smoker; Marital Status - Single; Alcohol Use: Never; Drug Use: No History; Caffeine Use: Never; Financial Concerns: No; Food, Clothing or Shelter Needs: No; Support System Lacking: No; Transportation Concerns: No Electronic Signature(s) Signed: 09/18/2021 2:26:48 PM By: Kalman Shan DO Signed: 09/19/2021 12:25:45 PM By: Rhae Hammock RN Entered By: Kalman Shan on 09/18/2021 14:11:44 -------------------------------------------------------------------------------- SuperBill Details Patient Name: Date of Service: [name], [name] 09/18/2021 Medical Record Number: [id number] Patient Account Number: [id number] Date of Birth/Sex: Treating RN: 31-Aug-1971 (50 y.o. [name], [name] Primary Care Provider: [name] Other Clinician: Referring Provider: Treating Provider/Extender: Delano Metz in Treatment: 26 Diagnosis Coding ICD-10 Codes Code Description [phone] Non-pressure chronic ulcer of other part of left foot with fat layer exposed E11.621 Type 2 diabetes mellitus with foot ulcer S81.801D Unspecified open wound, right lower leg, subsequent encounter E11.42 Type 2 diabetes mellitus with diabetic polyneuropathy Z89.511 Acquired absence of right leg below knee Facility Procedures CPT4 Code: [id number] Description: (Facility Use Only) Apligraf 1 SQ CM Modifier: Quantity: 41 CPT4 Code: [id number] Description: 96789 - SKIN SUB GRAFT FACE/NK/HF/G ICD-10 Diagnosis Description L97.522 Non-pressure chronic ulcer of other part of left foot with fat layer exposed Modifier: Quantity: 1 Physician Procedures : CPT4 Code Description Modifier [id number] - WC PHYS SKIN SUB GRAFT FACE/NK/HF/G ICD-10 Diagnosis Description L97.522 Non-pressure chronic ulcer of other part of left foot with fat layer exposed Quantity:  1 Electronic Signature(s) Signed: 09/18/2021 2:26:48 PM By: Kalman Shan DO Entered By: Kalman Shan on 09/18/2021 14:26:19

## 2021-09-25 ENCOUNTER — Other Ambulatory Visit: Payer: Self-pay

## 2021-09-25 ENCOUNTER — Encounter (HOSPITAL_BASED_OUTPATIENT_CLINIC_OR_DEPARTMENT_OTHER): Payer: Medicare Other | Attending: Internal Medicine | Admitting: Internal Medicine

## 2021-09-25 DIAGNOSIS — L97522 Non-pressure chronic ulcer of other part of left foot with fat layer exposed: Secondary | ICD-10-CM | POA: Diagnosis not present

## 2021-09-25 DIAGNOSIS — E1142 Type 2 diabetes mellitus with diabetic polyneuropathy: Secondary | ICD-10-CM | POA: Insufficient documentation

## 2021-09-25 DIAGNOSIS — Z89511 Acquired absence of right leg below knee: Secondary | ICD-10-CM | POA: Diagnosis not present

## 2021-09-25 DIAGNOSIS — E11621 Type 2 diabetes mellitus with foot ulcer: Secondary | ICD-10-CM | POA: Insufficient documentation

## 2021-09-25 DIAGNOSIS — X58XXXA Exposure to other specified factors, initial encounter: Secondary | ICD-10-CM | POA: Insufficient documentation

## 2021-09-25 DIAGNOSIS — S81801A Unspecified open wound, right lower leg, initial encounter: Secondary | ICD-10-CM | POA: Diagnosis not present

## 2021-09-30 NOTE — Progress Notes (Addendum)
CHARES, SLAYMAKER ([id number]) Visit Report for 09/25/2021 Arrival Information Details Patient Name: Date of Service: [name] [name] 09/25/2021 9:15 A M Medical Record Number: [id number] Patient Account Number: [id number] Date of Birth/Sex: Treating RN: 11/29/1970 (50 y.o. [name], [name] Primary Care Taila Basinski: [name] Other Clinician: Referring Dandre Sisler: Treating Wataru Mccowen/Extender: Delano Metz in Treatment: 45 Visit Information History Since Last Visit Added or deleted any medications: No Patient Arrived: Ambulatory Any new allergies or adverse reactions: No Arrival Time: 09:27 Had a fall or experienced change in No Accompanied By: self activities of daily living that may affect Transfer Assistance: None risk of falls: Patient Identification Verified: Yes Signs or symptoms of abuse/neglect since last visito No Secondary Verification Process Completed: Yes Hospitalized since last visit: No Patient Requires Transmission-Based Precautions: No Implantable device outside of the clinic excluding No Patient Has Alerts: No cellular tissue based products placed in the center since last visit: Has Dressing in Place as Prescribed: Yes Pain Present Now: Yes Electronic Signature(s) Signed: 09/25/2021 10:21:29 AM By: Sandre Kitty Entered By: Sandre Kitty on 09/25/2021 09:27:33 -------------------------------------------------------------------------------- Clinic Level of Care Assessment Details Patient Name: Date of Service: [name] Arrow [name] 09/25/2021 9:15 A M Medical Record Number: [id number] Patient Account Number: [id number] Date of Birth/Sex: Treating RN: November 13, 1971 (50 y.o. [name], [name] Primary Care Estefania Kamiya: [name] Other Clinician: Referring Skip Litke: Treating Joniah Bednarski/Extender: Delano Metz in Treatment: 83 Clinic Level of Care Assessment Items TOOL 4 Quantity Score X- 1  0 Use when only an EandM is performed on FOLLOW-UP visit ASSESSMENTS - Nursing Assessment / Reassessment X- 1 10 Reassessment of Co-morbidities (includes updates in patient status) X- 1 5 Reassessment of Adherence to Treatment Plan ASSESSMENTS - Wound and Skin A ssessment / Reassessment X - Simple Wound Assessment / Reassessment - one wound 1 5 '[]'  - 0 Complex Wound Assessment / Reassessment - multiple wounds '[]'  - 0 Dermatologic / Skin Assessment (not related to wound area) ASSESSMENTS - Focused Assessment '[]'  - 0 Circumferential Edema Measurements - multi extremities '[]'  - 0 Nutritional Assessment / Counseling / Intervention '[]'  - 0 Lower Extremity Assessment (monofilament, tuning fork, pulses) '[]'  - 0 Peripheral Arterial Disease Assessment (using hand held doppler) ASSESSMENTS - Ostomy and/or Continence Assessment and Care '[]'  - 0 Incontinence Assessment and Management '[]'  - 0 Ostomy Care Assessment and Management (repouching, etc.) PROCESS - Coordination of Care X - Simple Patient / Family Education for ongoing care 1 15 '[]'  - 0 Complex (extensive) Patient / Family Education for ongoing care X- 1 10 Staff obtains Programmer, systems, Records, T Results / Process Orders est '[]'  - 0 Staff telephones HHA, Nursing Homes / Clarify orders / etc '[]'  - 0 Routine Transfer to another Facility (non-emergent condition) '[]'  - 0 Routine Hospital Admission (non-emergent condition) '[]'  - 0 New Admissions / Biomedical engineer / Ordering NPWT Apligraf, etc. , '[]'  - 0 Emergency Hospital Admission (emergent condition) X- 1 10 Simple Discharge Coordination '[]'  - 0 Complex (extensive) Discharge Coordination PROCESS - Special Needs '[]'  - 0 Pediatric / Minor Patient Management '[]'  - 0 Isolation Patient Management '[]'  - 0 Hearing / Language / Visual special needs '[]'  - 0 Assessment of Community assistance (transportation, D/C planning, etc.) '[]'  - 0 Additional assistance / Altered mentation '[]'  -  0 Support Surface(s) Assessment (bed, cushion, seat, etc.) INTERVENTIONS - Wound Cleansing / Measurement X - Simple Wound Cleansing - one wound 1 5 '[]'  - 0 Complex Wound Cleansing -  multiple wounds X- 1 5 Wound Imaging (photographs - any number of wounds) '[]'  - 0 Wound Tracing (instead of photographs) X- 1 5 Simple Wound Measurement - one wound '[]'  - 0 Complex Wound Measurement - multiple wounds INTERVENTIONS - Wound Dressings X - Small Wound Dressing one or multiple wounds 1 10 '[]'  - 0 Medium Wound Dressing one or multiple wounds '[]'  - 0 Large Wound Dressing one or multiple wounds X- 1 5 Application of Medications - topical '[]'  - 0 Application of Medications - injection INTERVENTIONS - Miscellaneous '[]'  - 0 External ear exam '[]'  - 0 Specimen Collection (cultures, biopsies, blood, body fluids, etc.) '[]'  - 0 Specimen(s) / Culture(s) sent or taken to Lab for analysis '[]'  - 0 Patient Transfer (multiple staff / Civil Service fast streamer / Similar devices) '[]'  - 0 Simple Staple / Suture removal (25 or less) '[]'  - 0 Complex Staple / Suture removal (26 or more) '[]'  - 0 Hypo / Hyperglycemic Management (close monitor of Blood Glucose) '[]'  - 0 Ankle / Brachial Index (ABI) - do not check if billed separately X- 1 5 Vital Signs Has the patient been seen at the hospital within the last three years: Yes Total Score: 90 Level Of Care: New/Established - Level 3 Electronic Signature(s) Signed: 12/11/2021 12:38:30 PM By: Rhae Hammock RN Entered By: Rhae Hammock on 10/01/2021 13:34:50 -------------------------------------------------------------------------------- Encounter Discharge Information Details Patient Name: Date of Service: [name] [name], [name] 09/25/2021 9:15 A M Medical Record Number: [id number] Patient Account Number: [id number] Date of Birth/Sex: Treating RN: 1971/06/13 (50 y.o. [name], [name] Primary Care Deserae Jennings: [name] Other Clinician: Referring Skila Rollins: Treating  Cherly Erno/Extender: Delano Metz in Treatment: 58 Encounter Discharge Information Items Discharge Condition: Stable Ambulatory Status: Ambulatory Discharge Destination: Home Transportation: Private Auto Accompanied By: self Schedule Follow-up Appointment: Yes Clinical Summary of Care: Patient Declined Electronic Signature(s) Signed: 09/30/2021 4:45:49 PM By: Rhae Hammock RN Entered By: Rhae Hammock on 09/25/2021 10:01:34 -------------------------------------------------------------------------------- Lower Extremity Assessment Details Patient Name: Date of Service: [name] [name], [name] 09/25/2021 9:15 A M Medical Record Number: [id number] Patient Account Number: [id number] Date of Birth/Sex: Treating RN: 1971/09/11 (50 y.o. [name], [name] Primary Care Madlynn Lundeen: [name] Other Clinician: Referring Alethia Melendrez: Treating Daven Pinckney/Extender: Delano Metz in Treatment: 36 Edema Assessment Assessed: [name]: Yes] [name]: No] Edema: [Left: N] [Right: o] Calf Left: Right: Point of Measurement: 50 cm From Medial Instep 39.8 cm Ankle Left: Right: Point of Measurement: 11 cm From Medial Instep 21.2 cm Vascular Assessment Pulses: Dorsalis Pedis Palpable: [Left:Yes] Posterior Tibial Palpable: [Left:Yes] Electronic Signature(s) Signed: 09/25/2021 10:21:29 AM By: Sandre Kitty Signed: 09/30/2021 4:45:49 PM By: Rhae Hammock RN Entered By: Sandre Kitty on 09/25/2021 10:07:04 -------------------------------------------------------------------------------- Multi Wound Chart Details Patient Name: Date of Service: [name] [name], [name] 09/25/2021 9:15 A M Medical Record Number: [id number] Patient Account Number: [id number] Date of Birth/Sex: Treating RN: [birth year] (50 y.o. [name], [name] Primary Care Rozlyn Yerby: [name] Other Clinician: Referring Precious Segall: Treating Carys Malina/Extender: Delano Metz in Treatment: 45 Vital Signs Height(in): 77 Pulse(bpm): 98 Weight(lbs): 232 Blood Pressure(mmHg): 131/87 Body Mass Index(BMI): 28 Temperature(F): 98.5 Respiratory Rate(breaths/min): 17 Photos: [N/A:N/A] Left Metatarsal head first N/A N/A Wound Location: Blister N/A N/A Wounding Event: Diabetic Wound/Ulcer of the Lower N/A N/A Primary Etiology: Extremity Anemia, Hypertension, Peripheral N/A N/A Comorbid History: Venous Disease, Type II Diabetes 06/23/2020 N/A N/A Date Acquired: 84 N/A N/A Weeks of Treatment: Open N/A N/A Wound Status: Yes N/A N/A Wound  Recurrence: 0.3x0.2x0.3 N/A N/A Measurements L x W x D (cm) 0.047 N/A N/A A (cm) : rea 0.014 N/A N/A Volume (cm) : -51.60% N/A N/A % Reduction in A rea: -366.70% N/A N/A % Reduction in Volume: Grade 2 N/A N/A Classification: Medium N/A N/A Exudate A mount: Serosanguineous N/A N/A Exudate Type: red, brown N/A N/A Exudate Color: Well defined, not attached N/A N/A Wound Margin: Large (67-100%) N/A N/A Granulation A mount: Red N/A N/A Granulation Quality: None Present (0%) N/A N/A Necrotic A mount: Fat Layer (Subcutaneous Tissue): Yes N/A N/A Exposed Structures: Fascia: No Tendon: No Muscle: No Joint: No Bone: No None N/A N/A Epithelialization: Treatment Notes Wound #2R (Metatarsal head first) Wound Laterality: Left Cleanser Soap and Water Discharge Instruction: May shower and wash wound with dial antibacterial soap and water prior to dressing change. Wound Cleanser Discharge Instruction: Cleanse the wound with wound cleanser prior to applying a clean dressing using gauze sponges, not tissue or cotton balls. Peri-Wound Care Sween Lotion (Moisturizing lotion) Discharge Instruction: Apply moisturizing lotion to leg Topical Primary Dressing Secondary Dressing Woven Gauze Sponges 2x2 in Discharge Instruction: Apply over primary dressing as directed. Optifoam  Non-Adhesive Dressing, 4x4 in Discharge Instruction: cut to make foam donut to help offload ADAPTIC TOUCH 3x4.25 in Discharge Instruction: Apply over primary dressing as directed. Secured With Conforming Stretch Gauze Bandage, Sterile 2x75 (in/in) Discharge Instruction: Secure with stretch gauze as directed. 58M Medipore H Soft Cloth Surgical T ape, 2x2 (in/yd) Discharge Instruction: Secure dressing with tape as directed. Compression Wrap Compression Stockings Add-Ons Electronic Signature(s) Signed: 09/25/2021 11:40:52 AM By: Kalman Shan DO Signed: 09/30/2021 4:45:49 PM By: Rhae Hammock RN Entered By: Kalman Shan on 09/25/2021 11:29:33 -------------------------------------------------------------------------------- Multi-Disciplinary Care Plan Details Patient Name: Date of Service: [name] [name], [name] [name] 09/25/2021 9:15 A M Medical Record Number: [id number] Patient Account Number: [id number] Date of Birth/Sex: Treating RN: [birth year] (50 y.o. [name], [name] Primary Care Bethany Hirt: [name] Other Clinician: Referring Adair Lemar: Treating Guenther Dunshee/Extender: Delano Metz in Treatment: 22 Multidisciplinary Care Plan reviewed with physician Active Inactive Wound/Skin Impairment Nursing Diagnoses: Impaired tissue integrity Goals: Patient/caregiver will verbalize understanding of skin care regimen Date Initiated: 11/19/2020 Target Resolution Date: 10/24/2021 Goal Status: Active Ulcer/skin breakdown will have a volume reduction of 30% by week 4 Date Initiated: 08/16/2020 Date Inactivated: 03/03/2021 Target Resolution Date: 03/10/2021 Goal Status: Met Interventions: Provide education on ulcer and skin care Notes: 07/24/21: Wound care regimen ongoing. Electronic Signature(s) Signed: 09/30/2021 4:45:49 PM By: Rhae Hammock RN Entered By: Rhae Hammock on 09/25/2021  10:00:07 -------------------------------------------------------------------------------- Pain Assessment Details Patient Name: Date of Service: [name] [name], [name] [name] 09/25/2021 9:15 A M Medical Record Number: [id number] Patient Account Number: [id number] Date of Birth/Sex: Treating RN: 12/01/1970 (50 y.o. [name] [name] Primary Care Gloristine Turrubiates: [name] Other Clinician: Referring Thompson Mckim: Treating Krishauna Schatzman/Extender: Delano Metz in Treatment: 70 Active Problems Location of Pain Severity and Description of Pain Patient Has Paino Yes Site Locations Rate the pain. Current Pain Level: 7 Pain Management and Medication Current Pain Management: Electronic Signature(s) Signed: 09/25/2021 10:21:29 AM By: Sandre Kitty Signed: 09/30/2021 4:45:49 PM By: Rhae Hammock RN Entered By: Sandre Kitty on 09/25/2021 09:28:12 -------------------------------------------------------------------------------- Patient/Caregiver Education Details Patient Name: Date of Service: [name] [name], [name] [name] 11/3/2022andnbsp9:15 A M Medical Record Number: [id number] Patient Account Number: [id number] Date of Birth/Gender: Treating RN: 04/04/1971 (50 y.o. [name] [name] Primary Care Physician: [name] Other Clinician: Referring Physician: Treating Physician/Extender: [name] [name]  Weeks in Treatment: 82 Education Assessment Education Provided To: Patient Education Topics Provided Wound/Skin Impairment: Methods: Explain/Verbal Responses: Reinforcements needed Electronic Signature(s) Signed: 09/30/2021 4:45:49 PM By: Rhae Hammock RN Entered By: Rhae Hammock on 09/25/2021 10:00:33 -------------------------------------------------------------------------------- Wound Assessment Details Patient Name: Date of Service: [name] [name], [name] [name] 09/25/2021 9:15 A M Medical Record Number: [id number] Patient Account Number:  [id number] Date of Birth/Sex: Treating RN: 1971/08/29 (50 y.o. [name], [name] Primary Care Dwan Hemmelgarn: [name] Other Clinician: Referring Yaneli Keithley: Treating Ruble Pumphrey/Extender: Delano Metz in Treatment: 57 Wound Status Wound Number: 2R Primary Diabetic Wound/Ulcer of the Lower Extremity Etiology: Wound Location: Left Metatarsal head first Wound Status: Open Wounding Event: Blister Comorbid Anemia, Hypertension, Peripheral Venous Disease, Type II Date Acquired: 06/23/2020 History: Diabetes Weeks Of Treatment: 57 Clustered Wound: No Photos Wound Measurements Length: (cm) 0.3 Width: (cm) 0.2 Depth: (cm) 0.3 Area: (cm) 0.047 Volume: (cm) 0.014 % Reduction in Area: -51.6% % Reduction in Volume: -366.7% Epithelialization: None Wound Description Classification: Grade 2 Wound Margin: Well defined, not attached Exudate Amount: Medium Exudate Type: Serosanguineous Exudate Color: red, brown Foul Odor After Cleansing: No Slough/Fibrino No Wound Bed Granulation Amount: Large (67-100%) Exposed Structure Granulation Quality: Red Fascia Exposed: No Necrotic Amount: None Present (0%) Fat Layer (Subcutaneous Tissue) Exposed: Yes Tendon Exposed: No Muscle Exposed: No Joint Exposed: No Bone Exposed: No Electronic Signature(s) Signed: 09/30/2021 4:45:49 PM By: Rhae Hammock RN Entered By: Rhae Hammock on 09/25/2021 09:33:09 -------------------------------------------------------------------------------- Vitals Details Patient Name: Date of Service: [name] [name], [name] [name] 09/25/2021 9:15 A M Medical Record Number: [id number] Patient Account Number: [id number] Date of Birth/Sex: Treating RN: Feb 26, 1971 (50 y.o. [name], [name] Primary Care Ailana Cuadrado: [name] Other Clinician: Referring Kveon Casanas: Treating Anadelia Kintz/Extender: Delano Metz in Treatment: 90 Vital Signs Time Taken: 09:27 Temperature  (F): 98.5 Height (in): 77 Pulse (bpm): 98 Weight (lbs): 232 Respiratory Rate (breaths/min): 17 Body Mass Index (BMI): 27.5 Blood Pressure (mmHg): 131/87 Reference Range: 80 - 120 mg / dl Electronic Signature(s) Signed: 09/25/2021 10:21:29 AM By: Sandre Kitty Entered By: Sandre Kitty on 09/25/2021 09:27:56

## 2021-09-30 NOTE — Progress Notes (Addendum)
[name], [name] ([id number]) Visit Report for 09/25/2021 Chief Complaint Document Details Patient Name: Date of Service: [name] [name] 09/25/2021 9:15 A M Medical Record Number: [id number] Patient Account Number: [id number] Date of Birth/Sex: Treating RN: 1971/02/21 (50 y.o. [name] [name], [name] Primary Care Provider: [name] Other Clinician: Referring Provider: Treating Provider/Extender: Delano Metz in Treatment: 89 Information Obtained from: Patient Chief Complaint patient is here for a review of the wound on his plantar left first metatarsal head and back of his right leg Electronic Signature(s) Signed: 09/25/2021 11:40:52 AM By: Kalman Shan DO Entered By: Kalman Shan on 09/25/2021 11:29:54 -------------------------------------------------------------------------------- HPI Details Patient Name: Date of Service: [name] ND, [name] 09/25/2021 9:15 A M Medical Record Number: [id number] Patient Account Number: [id number] Date of Birth/Sex: Treating RN: Sep 02, 1971 (50 y.o. [name] [name] Primary Care Provider: [name] Other Clinician: Referring Provider: Treating Provider/Extender: Delano Metz in Treatment: 10 History of Present Illness HPI Description: 04/21/18 ADMISSION This is a 50 year old man who is a type II diabetic. In spite of fact his hemoglobin A1c is actually quite good 5.73 months ago he is at a really difficult time over the last 6-7 months. He developed a rapidly progressive infection in the right foot in November 2018 associated with osteomyelitis and necrotizing fasciitis. 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 The stump required and a revision on 11/24/17. The stump revision was advertised as being secondary to falls. I'm not sure the progressive history here however this area is actually closed over. The patient tells me over the same timeframe he has had wounds on  the plantar aspect of his left foot. In the ED saw Dr. Sharol Given in April of this year. 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 It is really not clear that this patient is been dressing this with anything. He came in with the clinic without any specific dressing on the wound areas using his own tennis shoe. He tells me that he only ambulates of course to do a pivot transfer. He does not have his prosthesis for the right leg as of yet and he blames Medicaid for this. He does however use the foot to push himself along in his wheelchair at home. The patient has not had formal arterial studies. ABI in our clinic on the left was 1.13. Patient's past medical history includes type 2 diabetes, fracture of the right fibula. I see that he was treated for abscesses on his right buttock and chest in 2016. I did not look at the microbiology of this. 04/28/18; patient comes back in the clinic today with the wound pretty much the same as when he came in here last week. Small opening lots of undermining relatively. He tells [place] that nothing is really been on this for 3 days in spite of the fact that we gave him enough to dress this easily especially such a small wound. He says he lives with his mother, she is not capable of assisting with this he is changing the dressings himself. 05/05/18; much better-looking wound today. Smaller. There is some undermining medially however that's only perhaps 2 mm. No surrounding erythema. We've been using silver collagen 05/12/18; small wound on the first metatarsal head. No undermining no surrounding erythema. We've been using silver collagen he has home health coming out to change 05/20/18 the wound on the first metatarsal head looks better. Covered in surface debris/callus nonviable  tissue. Required debridement but post debridement this looks quite good. We've been using silver collagen. He has home health 05/27/18; first metatarsal head  wound continues to improve. Just about completely closed. Still a lot of surface debridement callus. We've been using silver collagen 06/06/18; the first metatarsal wound is completely healed over. There is still a lot of callus. I gently removed some of this just to make sure that there was no open area and there is not. The patient mentions to me that his left leg has felt like "lead" for about a week READMISSION 08/16/2020 This is a 50 year old man with type 2 diabetes. He returns to clinic today with a 1 month history of a blister and callus over the first metatarsal head. This is in exactly the same area as when he was here in 2019. 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 He has standard running shoes on the left foot to match the area in his prosthesis. He has only been covering this area with a Band-Aid has not really been specifically dressing this. ABI in our clinic on the left was 1.16. This is essentially stable from the value in 2019 10/1 the patient has a linear wound over the left first metatarsal head. Again not a lot of callus thick skin around this that I removed with a #3 curette. This is not go deep to bone or muscle it does not look to be infected. 10/8 first metatarsal head. The wound looks like it is closing however this is going to be a difficult area to offload in the future. He has a size 15 shoe and he says his shoes are years old. The inserts in these current shoes are totally useless and I have advised him to replace these. He may need a new pair of shoes and I have he got original prosthesis at [employer] [place] I have asked him to go back there. 11/9; the patient has not been here in more than a month. Not sure he was healed when he was here the last time. I told him he would have to get new shoes and certainly offload the area over the left first metatarsal head. He has done nothing of the kind. He arrives back in  clinic with the same old new balance sneakers. A very large separating callus over the first metatarsal head on the left 11/16; he has purchased new shoes and has at least 2 insoles like I asked. The wound is measuring much smaller. He is using silver alginate he changes the dressing himself 11/30; wound is measurably smaller in length of about a half a centimeter. This is generally very little depth. We have been using silver alginate. He changes the dressing himself every second day. 12/14; wound is about the same size. Significant undermining medially relative to the size of the wound. We have been using silver alginate. There is not a way to offload this area as he walks with a prosthesis on the right leg 12/28; wound is about the same size. Again he has undermining medially. I am using polymen on this 12/02/2020; the wound looks smaller. Still a senescent edge. We have been using polymen 1/24; the wound is come down a few millimeters. Still a senescent edge. I have been using polymen 2/8; the wound is come down slightly. Still with slight undermining from 12-6 o'clock I have been using polymen partially to get offloading on this  area which is opposed by his prosthesis on the other leg. 2/22 quite open improvement he still has a comma shaped wound on the left first metatarsal head. Some depth. Using polymen 3/14; he still has the same problem of a comma shaped area over the left first metatarsal head. This seems to have had more depth today at 0.6 cm. We have been using polymen. The patient changes this himself every second day. I explored the idea of a total contact cast on the left leg however this is his driving foot. He was not enamored with the idea of not being able to drive and states that he does not have anybody else to get him around 3/28; again he comes in with a smaller looking wound however there is depth and undermining requiring debridement. We have been using Hydrofera  Blue, changed to silver collagen today. The patient is doing the dressing himself 4/11; patient presents today for follow-up of his left diabetic foot wound. He denies any issues in the past 2 weeks. He is currently using silver collagen every other day with dressing changes. He does not use diabetic shoes or special inserts to his sneakers. He does not use felt pads for offloading. 4/19; patient presents for his 1 week follow-up. He denies any issues. He reports using Hydrofera Blue and has not been using silver collagen for dressing changes. He reports minimal drainage to the wound. He overall feels well. 5/3; patient presents for 2-week follow-up. He has been using silver collagen without issues. He has no complaints today and states he overall feels well. 5/17; patient presents for 2-week follow-up. He states that the sleeve is rubbing up against the back of his right leg and causing a wound. He states this happened a week ago and has not been dressing the wound. The plantar wound is stable and he has been using collagen every other day. 5/24; Patient was had a recent hospitalization for cellulitis of his right popliteal fossa. He was admitted and started on IV antibiotics. He does not recall the plantar wound dressed while inpatient. He feels a lot better today. He was discharged on 5/21 on oral antibiotics and has not picked this up until today. He reports using collagen to the plantar wound since discharge. He is keeping the right posterior knee wound covered. He is getting a new sleeve for his prostatic on 5/26. He currently denies signs of infection. 6/1; patient presents for 1 week follow-up. He is still on doxycycline. He has not been dressing the back of the right knee wound. He states he is receiving his new prosthetic sleeve tomorrow. He states he is using silver alginate to the plantar foot wound. He currently denies signs of infection. He states he has not been offloading the left foot  wound 6/7; patient presents for 1 week follow-up. He completes his last dose of doxycycline today. He finally received his prosthetic sleeve for the right side and he states it feels much better. He has been using antibiotic ointment to the back of that right knee wound. He has been using silver alginate to the plantar foot wound. He currently denies signs of infection. 6/21; patient presents for 2-week follow-up. He reports no issues with his prosthetic sleeve. He has been using antibiotic ointment to the back of the right knee. He has been using silver alginate to the plantar foot wound. He has no issues or complaints today. He denies signs of infection. 7/5; patient presents for 2-week follow-up. He reports  no issues or complaints today. He continues to use antibiotic ointment to the right posterior knee wound and silver alginate to the left plantar foot wound. He denies signs of infection. 7/19; patient presents for 2-week follow-up. He continues to use antibiotic ointment to the right posterior knee and silver alginate to the left plantar foot wound. He has no issues or complaints today. He denies signs of infection. 8/4; patient presents for 2-week follow-up. He has no issues or complaints today. He denies signs of infection. 8/18; patient presents for 2-week follow-up. He has no issues or concerns today. He has been approved for Apligraf and he would like to have this placed today. He denies signs of infection and reports that his right posterior knee wound is closed. 8/25; patient presents for 1 week follow-up. He had no issues with his first Apligraf placement last week. He did however develop an abscess to the back of his right knee. He reports tenderness to this area. He denies systemic signs of infection. 9/1; patient presents for 1 week follow-up. He has had no issues with his plantar foot wound. The Apligraf has stayed in place over the past week. A wound culture was done at last clinic  visit and a new antibiotic was sent to the pharmacy. Patient has not received this yet. He reports some mild tenderness to the back of his right knee. He denies systemic signs of infection. 9/8; patient presents for 1 week follow-up. He states he took Keflex and had diarrhea as a side effect. He was able to complete the course. He reports continued tenderness to the right posterior knee wound. He has some drainage still. He denies increased warmth or erythema to the area. He denies fever/chills or nausea/vomiting. He has no issues or complaints today about his plantar foot wound. 9/15; patient presents for follow-up. He reports improvement in pain to the back of his right leg. He does not report any drainage. He had an ultrasound completed on 9/9 that showed a superficial fluid collection concerning for abscess. We attempted to call patient with results with no answer. We did put an urgent referral to general surgery and he reports he has not received a call. He currently denies systemic signs of infection. He reports no issues to his left plantar foot wound. 9/27; patient presents for follow-up. He has no issues or complaints today. He has had no issues with the previous wound to the back of his right leg. He has not made an appointment to see general surgery. He had Apligraf placed at last clinic visit to the plantar foot wound. He currently denies signs of infection. 10/7; patient presents for follow-up. Upon entering the room there is vomit throughout the entire floor. Patient states he does not feel well. I recommended he go to urgent care. I recommended following up next week for wound care. 10/10; patient presents for follow-up. He came last week for his follow-up however he was ill and started vomiting in the exam room. Today he states he feels better however he continues to have vomiting episodes. He states he attempted to go to urgent care/the ED but could not find a parking spot.  He subsequently went home. He has no issues or complaints today regarding the foot wound. He never followed up with general surgery for the abscess identified on ultrasound to the back of his right knee. 10/20; patient presents for follow-up. He has no issues or complaints today. Overall he feels well today. 10/27; patient presents for  follow-up. He has no issues or complaints today. 11/3; patient presents for follow-up. He has no issues or complaints today. He states he does not want to follow-up with orthopedics to reevaluate the abscess in the back of his right leg. He denies signs of infection. Electronic Signature(s) Signed: 09/25/2021 11:40:52 AM By: Kalman Shan DO Entered By: Kalman Shan on 09/25/2021 11:32:37 -------------------------------------------------------------------------------- Physical Exam Details Patient Name: Date of Service: [name] ND, [place] 09/25/2021 9:15 A M Medical Record Number: [id number] Patient Account Number: [id number] Date of Birth/Sex: Treating RN: 02-Jun-1971 (50 y.o. [name] [name] Primary Care Provider: [name] Other Clinician: Referring Provider: Treating Provider/Extender: Delano Metz in Treatment: 18 Constitutional respirations regular, non-labored and within target range for patient.. Cardiovascular 2+ dorsalis pedis/posterior tibialis pulses. Psychiatric pleasant and cooperative. Notes Left foot: Small open wound with granulation tissue and minimal undermining circumferentially to the first met head. Circumferential callus. No signs of infection on exam. Electronic Signature(s) Signed: 09/25/2021 11:40:52 AM By: Kalman Shan DO Entered By: Kalman Shan on 09/25/2021 11:36:33 -------------------------------------------------------------------------------- Physician Orders Details Patient Name: Date of Service: [name], [place] 09/25/2021 9:15 A M Medical Record Number:  [id number] Patient Account Number: [id number] Date of Birth/Sex: Treating RN: [birth year] (50 y.o. [name] [name], [name] Primary Care Provider: [name] Other Clinician: Referring Provider: Treating Provider/Extender: Delano Metz in Treatment: 39 Verbal / Phone Orders: No Diagnosis Coding ICD-10 Coding Code Description [phone] Non-pressure chronic ulcer of other part of left foot with fat layer exposed E11.621 Type 2 diabetes mellitus with foot ulcer S81.801D Unspecified open wound, right lower leg, subsequent encounter E11.42 Type 2 diabetes mellitus with diabetic polyneuropathy Z89.511 Acquired absence of right leg below knee Follow-up Appointments ppointment in 1 week. - with Dr. Heber [place] (Thurs/Fri) Return A Cellular or Tissue Based Products Cellular or Tissue Based Product Type: - Apligraf #10 next week when it comes in Edema Control - Lymphedema / SCD / Other Elevate legs to the level of the heart or above for 30 minutes daily and/or when sitting, a frequency of: Avoid standing for long periods of time. Wound Treatment Wound #2R - Metatarsal head first Wound Laterality: Left Cleanser: Soap and Water 1 x Per Week/15 Days Discharge Instructions: May shower and wash wound with dial antibacterial soap and water prior to dressing change. Cleanser: Wound Cleanser (Generic) 1 x Per Week/15 Days Discharge Instructions: Cleanse the wound with wound cleanser prior to applying a clean dressing using gauze sponges, not tissue or cotton balls. Peri-Wound Care: Sween Lotion (Moisturizing lotion) 1 x Per Week/15 Days Discharge Instructions: Apply moisturizing lotion to leg Secondary Dressing: Woven Gauze Sponges 2x2 in (Generic) 1 x Per Week/15 Days Discharge Instructions: Apply over primary dressing as directed. Secondary Dressing: Optifoam Non-Adhesive Dressing, 4x4 in (Generic) 1 x Per Week/15 Days Discharge Instructions: cut to make foam donut to help  offload Secondary Dressing: ADAPTIC TOUCH 3x4.25 in 1 x Per Week/15 Days Discharge Instructions: Apply over primary dressing as directed. Secured With: Child psychotherapist, Sterile 2x75 (in/in) (Generic) 1 x Per Week/15 Days Discharge Instructions: Secure with stretch gauze as directed. Secured With: 85M Medipore H Soft Cloth Surgical Tape, 2x2 (in/yd) (Generic) 1 x Per Week/15 Days Discharge Instructions: Secure dressing with tape as directed. Electronic Signature(s) Signed: 09/25/2021 11:40:52 AM By: Kalman Shan DO Entered By: Kalman Shan on 09/25/2021 11:37:13 -------------------------------------------------------------------------------- Problem List Details Patient Name: Date of Service: [name] ND, [place] 09/25/2021 9:15 A M Medical Record  Number: [id number] Patient Account Number: [id number] Date of Birth/Sex: Treating RN: February 23, 1971 (50 y.o. [name] [name], [name] Primary Care Provider: [name] Other Clinician: Referring Provider: Treating Provider/Extender: Delano Metz in Treatment: 27 Active Problems ICD-10 Encounter Code Description Active Date MDM Diagnosis [phone] Non-pressure chronic ulcer of other part of left foot with fat layer exposed 08/16/2020 No Yes E11.621 Type 2 diabetes mellitus with foot ulcer 08/16/2020 No Yes S81.801D Unspecified open wound, right lower leg, subsequent encounter 04/08/2021 No Yes E11.42 Type 2 diabetes mellitus with diabetic polyneuropathy 08/16/2020 No Yes Z89.511 Acquired absence of right leg below knee 08/16/2020 No Yes Inactive Problems Resolved Problems Electronic Signature(s) Signed: 09/25/2021 11:40:52 AM By: Kalman Shan DO Entered By: Kalman Shan on 09/25/2021 11:29:12 -------------------------------------------------------------------------------- Progress Note Details Patient Name: Date of Service: [name] ND, [name] 09/25/2021 9:15 A M Medical Record  Number: [id number] Patient Account Number: [id number] Date of Birth/Sex: Treating RN: 12-Jan-1971 (50 y.o. [name] [name], [name] Primary Care Provider: [name] Other Clinician: Referring Provider: Treating Provider/Extender: Delano Metz in Treatment: 62 Subjective Chief Complaint Information obtained from Patient patient is here for a review of the wound on his plantar left first metatarsal head and back of his right leg History of Present Illness (HPI) 04/21/18 ADMISSION This is a 50 year old man who is a type II diabetic. In spite of fact his hemoglobin A1c is actually quite good 5.73 months ago he is at a really difficult time over the last 6-7 months. He developed a rapidly progressive infection in the right foot in November 2018 associated with osteomyelitis and necrotizing fasciitis. 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 The stump required and a revision on 11/24/17. The stump revision was advertised as being secondary to falls. I'm not sure the progressive history here however this area is actually closed over. The patient tells me over the same timeframe he has had wounds on the plantar aspect of his left foot. In the ED saw Dr. Sharol Given in April of this year. 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 It is really not clear that this patient is been dressing this with anything. He came in with the clinic without any specific dressing on the wound areas using his own tennis shoe. He tells me that he only ambulates of course to do a pivot transfer. He does not have his prosthesis for the right leg as of yet and he blames Medicaid for this. He does however use the foot to push himself along in his wheelchair at home. The patient has not had formal arterial studies. ABI in our clinic on the left was 1.13. Patient's past medical history includes type 2 diabetes, fracture of the right fibula. I see that he was treated  for abscesses on his right buttock and chest in 2016. I did not look at the microbiology of this. 04/28/18; patient comes back in the clinic today with the wound pretty much the same as when he came in here last week. Small opening lots of undermining relatively. He tells [place] that nothing is really been on this for 3 days in spite of the fact that we gave him enough to dress this easily especially such a small wound. He says he lives with his mother, she is not capable of assisting with this he is changing the dressings himself. 05/05/18; much better-looking wound today. Smaller. There is some undermining medially however that's only perhaps 2  mm. No surrounding erythema. We've been using silver collagen 05/12/18; small wound on the first metatarsal head. No undermining no surrounding erythema. We've been using silver collagen he has home health coming out to change 05/20/18 the wound on the first metatarsal head looks better. Covered in surface debris/callus nonviable tissue. Required debridement but post debridement this looks quite good. We've been using silver collagen. He has home health 05/27/18; first metatarsal head wound continues to improve. Just about completely closed. Still a lot of surface debridement callus. We've been using silver collagen 06/06/18; the first metatarsal wound is completely healed over. There is still a lot of callus. I gently removed some of this just to make sure that there was no open area and there is not. The patient mentions to me that his left leg has felt like "lead" for about a week READMISSION 08/16/2020 This is a 50 year old man with type 2 diabetes. He returns to clinic today with a 1 month history of a blister and callus over the first metatarsal head. This is in exactly the same area as when he was here in 2019. 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 He has standard running shoes on the left foot to match the  area in his prosthesis. He has only been covering this area with a Band-Aid has not really been specifically dressing this. ABI in our clinic on the left was 1.16. This is essentially stable from the value in 2019 10/1 the patient has a linear wound over the left first metatarsal head. Again not a lot of callus thick skin around this that I removed with a #3 curette. This is not go deep to bone or muscle it does not look to be infected. 10/8 first metatarsal head. The wound looks like it is closing however this is going to be a difficult area to offload in the future. He has a size 15 shoe and he says his shoes are years old. The inserts in these current shoes are totally useless and I have advised him to replace these. He may need a new pair of shoes and I have he got original prosthesis at [employer] [facility] [place] I have asked him to go back there. 11/9; the patient has not been here in more than a month. Not sure he was healed when he was here the last time. I told him he would have to get new shoes and certainly offload the area over the left first metatarsal head. He has done nothing of the kind. He arrives back in clinic with the same old new balance sneakers. A very large separating callus over the first metatarsal head on the left 11/16; he has purchased new shoes and has at least 2 insoles like I asked. The wound is measuring much smaller. He is using silver alginate he changes the dressing himself 11/30; wound is measurably smaller in length of about a half a centimeter. This is generally very little depth. We have been using silver alginate. He changes the dressing himself every second day. 12/14; wound is about the same size. Significant undermining medially relative to the size of the wound. We have been using silver alginate. There is not a way to offload this area as he walks with a prosthesis on the right leg 12/28; wound is about the same size. Again he has undermining medially. I am  using polymen on this 12/02/2020; the wound looks smaller. Still a  senescent edge. We have been using polymen 1/24; the wound is come down a few millimeters. Still a senescent edge. I have been using polymen 2/8; the wound is come down slightly. Still with slight undermining from 12-6 o'clock I have been using polymen partially to get offloading on this area which is opposed by his prosthesis on the other leg. 2/22 quite open improvement he still has a comma shaped wound on the left first metatarsal head. Some depth. Using polymen 3/14; he still has the same problem of a comma shaped area over the left first metatarsal head. This seems to have had more depth today at 0.6 cm. We have been using polymen. The patient changes this himself every second day. I explored the idea of a total contact cast on the left leg however this is his driving foot. He was not enamored with the idea of not being able to drive and states that he does not have anybody else to get him around 3/28; again he comes in with a smaller looking wound however there is depth and undermining requiring debridement. We have been using Hydrofera Blue, changed to silver collagen today. The patient is doing the dressing himself 4/11; patient presents today for follow-up of his left diabetic foot wound. He denies any issues in the past 2 weeks. He is currently using silver collagen every other day with dressing changes. He does not use diabetic shoes or special inserts to his sneakers. He does not use felt pads for offloading. 4/19; patient presents for his 1 week follow-up. He denies any issues. He reports using Hydrofera Blue and has not been using silver collagen for dressing changes. He reports minimal drainage to the wound. He overall feels well. 5/3; patient presents for 2-week follow-up. He has been using silver collagen without issues. He has no complaints today and states he overall feels well. 5/17; patient presents for 2-week  follow-up. He states that the sleeve is rubbing up against the back of his right leg and causing a wound. He states this happened a week ago and has not been dressing the wound. The plantar wound is stable and he has been using collagen every other day. 5/24; Patient was had a recent hospitalization for cellulitis of his right popliteal fossa. He was admitted and started on IV antibiotics. He does not recall the plantar wound dressed while inpatient. He feels a lot better today. He was discharged on 5/21 on oral antibiotics and has not picked this up until today. He reports using collagen to the plantar wound since discharge. He is keeping the right posterior knee wound covered. He is getting a new sleeve for his prostatic on 5/26. He currently denies signs of infection. 6/1; patient presents for 1 week follow-up. He is still on doxycycline. He has not been dressing the back of the right knee wound. He states he is receiving his new prosthetic sleeve tomorrow. He states he is using silver alginate to the plantar foot wound. He currently denies signs of infection. He states he has not been offloading the left foot wound 6/7; patient presents for 1 week follow-up. He completes his last dose of doxycycline today. He finally received his prosthetic sleeve for the right side and he states it feels much better. He has been using antibiotic ointment to the back of that right knee wound. He has been using silver alginate to the plantar foot wound. He currently denies signs of infection. 6/21; patient presents for 2-week follow-up. He reports no  issues with his prosthetic sleeve. He has been using antibiotic ointment to the back of the right knee. He has been using silver alginate to the plantar foot wound. He has no issues or complaints today. He denies signs of infection. 7/5; patient presents for 2-week follow-up. He reports no issues or complaints today. He continues to use antibiotic ointment to the right  posterior knee wound and silver alginate to the left plantar foot wound. He denies signs of infection. 7/19; patient presents for 2-week follow-up. He continues to use antibiotic ointment to the right posterior knee and silver alginate to the left plantar foot wound. He has no issues or complaints today. He denies signs of infection. 8/4; patient presents for 2-week follow-up. He has no issues or complaints today. He denies signs of infection. 8/18; patient presents for 2-week follow-up. He has no issues or concerns today. He has been approved for Apligraf and he would like to have this placed today. He denies signs of infection and reports that his right posterior knee wound is closed. 8/25; patient presents for 1 week follow-up. He had no issues with his first Apligraf placement last week. He did however develop an abscess to the back of his right knee. He reports tenderness to this area. He denies systemic signs of infection. 9/1; patient presents for 1 week follow-up. He has had no issues with his plantar foot wound. The Apligraf has stayed in place over the past week. A wound culture was done at last clinic visit and a new antibiotic was sent to the pharmacy. Patient has not received this yet. He reports some mild tenderness to the back of his right knee. He denies systemic signs of infection. 9/8; patient presents for 1 week follow-up. He states he took Keflex and had diarrhea as a side effect. He was able to complete the course. He reports continued tenderness to the right posterior knee wound. He has some drainage still. He denies increased warmth or erythema to the area. He denies fever/chills or nausea/vomiting. He has no issues or complaints today about his plantar foot wound. 9/15; patient presents for follow-up. He reports improvement in pain to the back of his right leg. He does not report any drainage. He had an ultrasound completed on 9/9 that showed a superficial fluid collection  concerning for abscess. We attempted to call patient with results with no answer. We did put an urgent referral to general surgery and he reports he has not received a call. He currently denies systemic signs of infection. He reports no issues to his left plantar foot wound. 9/27; patient presents for follow-up. He has no issues or complaints today. He has had no issues with the previous wound to the back of his right leg. He has not made an appointment to see general surgery. He had Apligraf placed at last clinic visit to the plantar foot wound. He currently denies signs of infection. 10/7; patient presents for follow-up. Upon entering the room there is vomit throughout the entire floor. Patient states he does not feel well. I recommended he go to urgent care. I recommended following up next week for wound care. 10/10; patient presents for follow-up. He came last week for his follow-up however he was ill and started vomiting in the exam room. Today he states he feels better however he continues to have vomiting episodes. He states he attempted to go to urgent care/the ED but could not find a parking spot. He subsequently went home. He has no  issues or complaints today regarding the foot wound. He never followed up with general surgery for the abscess identified on ultrasound to the back of his right knee. 10/20; patient presents for follow-up. He has no issues or complaints today. Overall he feels well today. 10/27; patient presents for follow-up. He has no issues or complaints today. 11/3; patient presents for follow-up. He has no issues or complaints today. He states he does not want to follow-up with orthopedics to reevaluate the abscess in the back of his right leg. He denies signs of infection. Patient History Information obtained from Patient. Family History Diabetes - Maternal Grandparents,Mother, Heart Disease - Father, Hypertension - Father, No family history of Cancer, Hereditary  Spherocytosis, Kidney Disease, Lung Disease, Seizures, Stroke, Thyroid Problems, Tuberculosis. Social History Current every day smoker, Marital Status - Single, Alcohol Use - Never, Drug Use - No History, Caffeine Use - Never. Medical History Eyes Denies history of Cataracts, Glaucoma, Optic Neuritis Ear/Nose/Mouth/Throat Denies history of Chronic sinus problems/congestion, Middle ear problems Hematologic/Lymphatic Patient has history of Anemia Denies history of Hemophilia, Human Immunodeficiency Virus, Lymphedema, Sickle Cell Disease Respiratory Denies history of Aspiration, Asthma, Chronic Obstructive Pulmonary Disease (COPD), Pneumothorax, Sleep Apnea, Tuberculosis Cardiovascular Patient has history of Hypertension, Peripheral Venous Disease Denies history of Angina, Arrhythmia, Congestive Heart Failure, Coronary Artery Disease, Deep Vein Thrombosis, Hypotension, Myocardial Infarction, Peripheral Arterial Disease, Phlebitis, Vasculitis Gastrointestinal Denies history of Cirrhosis , Colitis, Crohnoos, Hepatitis A, Hepatitis B, Hepatitis C Endocrine Patient has history of Type II Diabetes - oral meds Denies history of Type I Diabetes Genitourinary Denies history of End Stage Renal Disease Immunological Denies history of Lupus Erythematosus, Raynaudoos, Scleroderma Integumentary (Skin) Denies history of History of Burn Musculoskeletal Denies history of Gout, Rheumatoid Arthritis, Osteoarthritis, Osteomyelitis Neurologic Denies history of Dementia, Neuropathy, Quadriplegia, Paraplegia, Seizure Disorder Oncologic Denies history of Received Chemotherapy, Received Radiation Psychiatric Denies history of Anorexia/bulimia, Confinement Anxiety Hospitalization/Surgery History - fever 105, sepsis cellulitis right popliteal fossa 04/09/2021. Medical A Surgical History Notes nd Constitutional Symptoms (General Health) falls , leukocytosis Respiratory During waking hours and catches  himself not breathing, "gasp for air". Saw Dr Wynonia Lawman, he couldn't find a reason Objective Constitutional respirations regular, non-labored and within target range for patient.. Vitals Time Taken: 9:27 AM, Height: 77 in, Weight: 232 lbs, BMI: 27.5, Temperature: 98.5 F, Pulse: 98 bpm, Respiratory Rate: 17 breaths/min, Blood Pressure: 131/87 mmHg. Cardiovascular 2+ dorsalis pedis/posterior tibialis pulses. Psychiatric pleasant and cooperative. General Notes: Left foot: Small open wound with granulation tissue and minimal undermining circumferentially to the first met head. Circumferential callus. No signs of infection on exam. Integumentary (Hair, Skin) Wound #2R status is Open. Original cause of wound was Blister. The date acquired was: 06/23/2020. The wound has been in treatment 57 weeks. The wound is located on the Left Metatarsal head first. The wound measures 0.3cm length x 0.2cm width x 0.3cm depth; 0.047cm^2 area and 0.014cm^3 volume. There is Fat Layer (Subcutaneous Tissue) exposed. There is a medium amount of serosanguineous drainage noted. The wound margin is well defined and not attached to the wound base. There is large (67-100%) red granulation within the wound bed. There is no necrotic tissue within the wound bed. 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 No signs of infection on exam. Unfortunately the Affinity was not ordered and is not available today to be placed in office. We will obtain this for next clinic visit. In the meantime I recommended offloading and using silver alginate. We are still awaiting the Pegasys inserts for  him to use. Plan Follow-up Appointments: Return Appointment in 1 week. - with Dr. Heber DeForest (Thurs/Fri) Cellular or Tissue Based Products: Cellular or Tissue Based Product Type: - Apligraf #10 next week when it comes in Edema Control - Lymphedema / SCD / Other: Elevate legs to the level of the heart or above for 30 minutes daily and/or when sitting, a frequency of: Avoid standing for long periods of time. WOUND #2R: - Metatarsal head first Wound Laterality: Left Cleanser: Soap and Water 1 x Per Week/15 Days Discharge Instructions: May shower and wash wound with dial antibacterial soap and water prior to dressing change. Cleanser: Wound Cleanser (Generic) 1 x Per Week/15 Days Discharge Instructions: Cleanse the wound with wound cleanser prior to applying a clean dressing using gauze sponges, not tissue or cotton balls. Peri-Wound Care: Sween Lotion (Moisturizing lotion) 1 x Per Week/15 Days Discharge Instructions: Apply moisturizing lotion to leg Secondary Dressing: Woven Gauze Sponges 2x2 in (Generic) 1 x Per Week/15 Days Discharge Instructions: Apply over primary dressing as directed. Secondary Dressing: Optifoam Non-Adhesive Dressing, 4x4 in (Generic) 1 x Per Week/15 Days Discharge Instructions: cut to make foam donut to help offload Secondary Dressing: ADAPTIC TOUCH 3x4.25 in 1 x Per Week/15 Days Discharge Instructions: Apply over primary dressing as directed. Secured With: Child psychotherapist, Sterile 2x75 (in/in) (Generic) 1 x Per Week/15 Days Discharge Instructions: Secure with stretch gauze as directed. Secured With: 54M Medipore H Soft Cloth Surgical T ape, 2x2 (in/yd) (Generic) 1 x Per Week/15 Days Discharge Instructions: Secure dressing with tape as directed. 1. Silver alginate 2. Aggressive offloading 3. Follow-up in 1 week Electronic Signature(s) Signed: 09/25/2021 11:40:52 AM By: Kalman Shan DO Entered By: Kalman Shan on 09/25/2021  11:39:40 -------------------------------------------------------------------------------- HxROS Details Patient Name: Date of Service: [name] ND, [name] 09/25/2021 9:15 A M Medical Record Number: [id number] Patient Account Number: [id number] Date of Birth/Sex: Treating RN: [birth year] (50 y.o. [name] [name] Primary Care Provider: [name] Other Clinician: Referring Provider: Treating Provider/Extender: Delano Metz in Treatment: 25 Information Obtained From Patient Constitutional Symptoms (General Health) Medical History: Past Medical History Notes: falls , leukocytosis Eyes Medical History: Negative for: Cataracts; Glaucoma; Optic Neuritis Ear/Nose/Mouth/Throat Medical History: Negative for: Chronic sinus problems/congestion; Middle ear problems Hematologic/Lymphatic Medical History: Positive for: Anemia Negative for: Hemophilia; Human Immunodeficiency Virus; Lymphedema; Sickle Cell Disease Respiratory Medical History: Negative for: Aspiration; Asthma; Chronic Obstructive Pulmonary Disease (COPD); Pneumothorax; Sleep Apnea; Tuberculosis Past Medical History Notes: During waking hours and catches himself not breathing, "gasp for air". Saw Dr Wynonia Lawman, he couldn't find a reason Cardiovascular Medical History: Positive for: Hypertension; Peripheral Venous Disease Negative for: Angina; Arrhythmia; Congestive Heart Failure; Coronary Artery Disease; Deep Vein Thrombosis; Hypotension; Myocardial Infarction; Peripheral Arterial Disease; Phlebitis; Vasculitis Gastrointestinal Medical History: Negative for: Cirrhosis ; Colitis; Crohns; Hepatitis A; Hepatitis B; Hepatitis C Endocrine Medical History: Positive for: Type II Diabetes - oral meds Negative for: Type I Diabetes Time with diabetes: 2014 Treated with: Oral agents Blood sugar tested every day: Yes Tested : Blood sugar testing results: Breakfast: 91 Genitourinary Medical  History: Negative for: End Stage Renal Disease Immunological Medical History: Negative for: Lupus Erythematosus; Raynauds; Scleroderma Integumentary (  Skin) Medical History: Negative for: History of Burn Musculoskeletal Medical History: Negative for: Gout; Rheumatoid Arthritis; Osteoarthritis; Osteomyelitis Neurologic Medical History: Negative for: Dementia; Neuropathy; Quadriplegia; Paraplegia; Seizure Disorder Oncologic Medical History: Negative for: Received Chemotherapy; Received Radiation Psychiatric Medical History: Negative for: Anorexia/bulimia; Confinement Anxiety Immunizations Pneumococcal Vaccine: Received Pneumococcal Vaccination: No Implantable Devices No devices added Hospitalization / Surgery History Type of Hospitalization/Surgery fever 105, sepsis cellulitis right popliteal fossa 04/09/2021 Family and Social History Cancer: No; Diabetes: Yes - Maternal Grandparents,Mother; Heart Disease: Yes - Father; Hereditary Spherocytosis: No; Hypertension: Yes - Father; Kidney Disease: No; Lung Disease: No; Seizures: No; Stroke: No; Thyroid Problems: No; Tuberculosis: No; Current every day smoker; Marital Status - Single; Alcohol Use: Never; Drug Use: No History; Caffeine Use: Never; Financial Concerns: No; Food, Clothing or Shelter Needs: No; Support System Lacking: No; Transportation Concerns: No Electronic Signature(s) Signed: 09/25/2021 11:40:52 AM By: Kalman Shan DO Signed: 09/30/2021 4:45:49 PM By: Rhae Hammock RN Entered By: Kalman Shan on 09/25/2021 11:32:53 -------------------------------------------------------------------------------- SuperBill Details Patient Name: Date of Service: [name] ND, [name] 09/25/2021 Medical Record Number: [id number] Patient Account Number: [id number] Date of Birth/Sex: Treating RN: 07-Mar-1971 (50 y.o. [name] [name], [name] Primary Care Provider: [name] Other Clinician: Referring Provider: Treating  Provider/Extender: Delano Metz in Treatment: 65 Diagnosis Coding ICD-10 Codes Code Description [phone] Non-pressure chronic ulcer of other part of left foot with fat layer exposed E11.621 Type 2 diabetes mellitus with foot ulcer S81.801D Unspecified open wound, right lower leg, subsequent encounter E11.42 Type 2 diabetes mellitus with diabetic polyneuropathy Z89.511 Acquired absence of right leg below knee Facility Procedures CPT4 Code: [id number] Description: 19 - WOUND CARE VISIT-LEV 3 EST PT 1 Modifier: Quantity: Electronic Signature(s) Signed: 10/06/2021 4:25:54 PM By: Kalman Shan DO Signed: 12/11/2021 12:38:30 PM By: Rhae Hammock RN Previous Signature: 09/25/2021 11:40:52 AM Version By: Kalman Shan DO Entered By: Rhae Hammock on 10/01/2021 13:35:05

## 2021-10-01 ENCOUNTER — Encounter (HOSPITAL_COMMUNITY): Payer: Medicare Other

## 2021-10-02 ENCOUNTER — Encounter (HOSPITAL_BASED_OUTPATIENT_CLINIC_OR_DEPARTMENT_OTHER): Payer: Medicare Other | Admitting: Internal Medicine

## 2021-10-07 ENCOUNTER — Other Ambulatory Visit: Payer: Self-pay

## 2021-10-07 ENCOUNTER — Encounter (HOSPITAL_BASED_OUTPATIENT_CLINIC_OR_DEPARTMENT_OTHER): Payer: Medicare Other | Admitting: Internal Medicine

## 2021-10-07 DIAGNOSIS — L97522 Non-pressure chronic ulcer of other part of left foot with fat layer exposed: Secondary | ICD-10-CM | POA: Diagnosis not present

## 2021-10-07 DIAGNOSIS — E11621 Type 2 diabetes mellitus with foot ulcer: Secondary | ICD-10-CM

## 2021-10-07 NOTE — Progress Notes (Signed)
LEEAM, CEDRONE ([id number]) Visit Report for 10/07/2021 Arrival Information Details Patient Name: Date of Service: [name] [name] 10/07/2021 10:45 A M Medical Record Number: [id number] Patient Account Number: [id number] Date of Birth/Sex: Treating RN: 1971/03/29 (50 y.o. [name] [name], [name] [name] Primary Care [name] [name]: [name] Other Clinician: Referring [name] [name]: Treating [name] [name]/Extender: Delano Metz in Treatment: 80 Visit Information History Since Last Visit Added or deleted any medications: No Patient Arrived: Ambulatory Any new allergies or adverse reactions: No Arrival Time: 11:18 Had a fall or experienced change in No Accompanied By: self activities of daily living that may affect Transfer Assistance: None risk of falls: Patient Identification Verified: Yes Signs or symptoms of abuse/neglect since last visito No Secondary Verification Process Completed: Yes Hospitalized since last visit: No Patient Requires Transmission-Based Precautions: No Implantable device outside of the clinic excluding No Patient Has Alerts: No cellular tissue based products placed in the center since last visit: Has Dressing in Place as Prescribed: Yes Pain Present Now: Yes Electronic Signature(s) Signed: 10/07/2021 5:46:53 PM By: Baruch Gouty RN, BSN Entered By: Baruch Gouty on 10/07/2021 11:20:03 -------------------------------------------------------------------------------- Encounter Discharge Information Details Patient Name: Date of Service: [name] [name] ND, [name] 10/07/2021 10:45 A M Medical Record Number: [id number] Patient Account Number: [id number] Date of Birth/Sex: Treating RN: 1971-04-30 (50 y.o. [name] [name] Primary Care Whitney Bingaman: [name] Other Clinician: Referring Carnie Bruemmer: Treating Male Minish/Extender: Delano Metz in Treatment: 36 Encounter Discharge Information Items Post Procedure  Vitals Discharge Condition: Stable Temperature (F): 99.1 Ambulatory Status: Ambulatory Pulse (bpm): 89 Discharge Destination: Home Respiratory Rate (breaths/min): 18 Transportation: Private Auto Blood Pressure (mmHg): 141/96 Accompanied By: self Schedule Follow-up Appointment: Yes Clinical Summary of Care: Patient Declined Electronic Signature(s) Signed: 10/07/2021 5:46:53 PM By: Baruch Gouty RN, BSN Entered By: Baruch Gouty on 10/07/2021 11:57:03 -------------------------------------------------------------------------------- Lower Extremity Assessment Details Patient Name: Date of Service: [name], [name] [name] 10/07/2021 10:45 A M Medical Record Number: [id number] Patient Account Number: [id number] Date of Birth/Sex: Treating RN: [birth year] (50 y.o. [name] [name] Primary Care Kennette Cuthrell: [name] Other Clinician: Referring Adonias Demore: Treating Corrie Brannen/Extender: Delano Metz in Treatment: 25 Edema Assessment Assessed: [name]: No] [Right: No] Edema: [Left: N] [Right: o] Calf Left: Right: Point of Measurement: 50 cm From Medial Instep 39.8 cm Ankle Left: Right: Point of Measurement: 11 cm From Medial Instep 21.2 cm Vascular Assessment Pulses: Dorsalis Pedis Palpable: [Left:Yes] Electronic Signature(s) Signed: 10/07/2021 5:46:53 PM By: Baruch Gouty RN, BSN Entered By: Baruch Gouty on 10/07/2021 11:24:35 -------------------------------------------------------------------------------- Multi Wound Chart Details Patient Name: Date of Service: [name] [name] ND, [name] 10/07/2021 10:45 A M Medical Record Number: [id number] Patient Account Number: [id number] Date of Birth/Sex: Treating RN: 01/05/1971 (50 y.o. [name] [name] Primary Care Tobin Cadiente: [name] Other Clinician: Referring Audwin Semper: Treating Maye Parkinson/Extender: Delano Metz in Treatment: 53 Vital Signs Height(in): 77 Pulse(bpm):  74 Weight(lbs): 232 Blood Pressure(mmHg): 141/96 Body Mass Index(BMI): 28 Temperature(F): 99.1 Respiratory Rate(breaths/min): 18 Photos: [2R:Left Metatarsal head first] [N/A:N/A N/A] Wound Location: [2R:Blister] [N/A:N/A] Wounding Event: [2R:Diabetic Wound/Ulcer of the Lower] [N/A:N/A] Primary Etiology: [2R:Extremity Anemia, Hypertension, Peripheral] [N/A:N/A] Comorbid History: [2R:Venous Disease, Type II Diabetes 06/23/2020] [N/A:N/A] Date Acquired: [2R:59] [N/A:N/A] Weeks of Treatment: [2R:Open] [N/A:N/A] Wound Status: [2R:Yes] [N/A:N/A] Wound Recurrence: [2R:0.6x0.3x0.3] [N/A:N/A] Measurements L x W x D (cm) [2R:0.141] [N/A:N/A] A (cm) : rea [2R:0.042] [N/A:N/A] Volume (cm) : [2R:-354.80%] [N/A:N/A] % Reduction in A [2R:rea: -1300.00%] [N/A:N/A] % Reduction in Volume: [2R:5] Starting Position 1 (o'clock): [  2R:12] Ending Position 1 (o'clock): [2R:0.5] Maximum Distance 1 (cm): [2R:Yes] [N/A:N/A] Undermining: [2R:Grade 2] [N/A:N/A] Classification: [2R:Medium] [N/A:N/A] Exudate A mount: [2R:Serosanguineous] [N/A:N/A] Exudate Type: [2R:red, brown] [N/A:N/A] Exudate Color: [2R:Well defined, not attached] [N/A:N/A] Wound Margin: [2R:Large (67-100%)] [N/A:N/A] Granulation A mount: [2R:Red] [N/A:N/A] Granulation Quality: [2R:None Present (0%)] [N/A:N/A] Necrotic A mount: [2R:Fat Layer (Subcutaneous Tissue): Yes N/A] Exposed Structures: [2R:Fascia: No Tendon: No Muscle: No Joint: No Bone: No None] [N/A:N/A] Epithelialization: [2R:Debridement - Excisional] [N/A:N/A] Debridement: Pre-procedure Verification/Time Out 11:30 [N/A:N/A] Taken: [2R:Callus, Subcutaneous, Slough] [N/A:N/A] Tissue Debrided: [2R:Skin/Subcutaneous Tissue] [N/A:N/A] Level: [2R:0.32] [N/A:N/A] Debridement A (sq cm): [2R:rea Curette] [N/A:N/A] Instrument: [2R:Minimum] [N/A:N/A] Bleeding: [2R:Pressure] [N/A:N/A] Hemostasis A chieved: [2R:0] [N/A:N/A] Procedural Pain: [2R:0] [N/A:N/A] Post Procedural Pain:  [2R:Procedure was tolerated well] [N/A:N/A] Debridement Treatment Response: [2R:0.8x0.4x0.3] [N/A:N/A] Post Debridement Measurements L x W x D (cm) [2R:0.075] [N/A:N/A] Post Debridement Volume: (cm) [2R:Cellular or Tissue Based Product] [N/A:N/A] Procedures Performed: [2R:Debridement] Treatment Notes Wound #2R (Metatarsal head first) Wound Laterality: Left Cleanser Soap and Water Discharge Instruction: May shower and wash wound with dial antibacterial soap and water prior to dressing change. Wound Cleanser Discharge Instruction: Cleanse the wound with wound cleanser prior to applying a clean dressing using gauze sponges, not tissue or cotton balls. Peri-Wound Care Sween Lotion (Moisturizing lotion) Discharge Instruction: Apply moisturizing lotion to leg Topical Primary Dressing Affinity Secondary Dressing Woven Gauze Sponges 2x2 in Discharge Instruction: Apply over primary dressing as directed. Optifoam Non-Adhesive Dressing, 4x4 in Discharge Instruction: cut to make foam donut to help offload ADAPTIC TOUCH 3x4.25 in Discharge Instruction: Apply over primary dressing as directed. Secured With Conforming Stretch Gauze Bandage, Sterile 2x75 (in/in) Discharge Instruction: Secure with stretch gauze as directed. 40M Medipore H Soft Cloth Surgical T ape, 2x2 (in/yd) Discharge Instruction: Secure dressing with tape as directed. Compression Wrap Compression Stockings Add-Ons Electronic Signature(s) Signed: 10/07/2021 12:02:50 PM By: Kalman Shan DO Signed: 10/07/2021 5:33:16 PM By: Deon Pilling RN, BSN Entered By: Kalman Shan on 10/07/2021 11:59:01 -------------------------------------------------------------------------------- Multi-Disciplinary Care Plan Details Patient Name: Date of Service: [name] [name] ND, [name] 10/07/2021 10:45 A M Medical Record Number: [id number] Patient Account Number: [id number] Date of Birth/Sex: Treating RN: November 06, 1971 (50 y.o. [name] [name] Primary Care Maresha Anastos: [name] Other Clinician: Referring Jevaun Strick: Treating Raoul Ciano/Extender: Delano Metz in Treatment: 44 Multidisciplinary Care Plan reviewed with physician Active Inactive Wound/Skin Impairment Nursing Diagnoses: Impaired tissue integrity Goals: Patient/caregiver will verbalize understanding of skin care regimen Date Initiated: 11/19/2020 Target Resolution Date: 10/24/2021 Goal Status: Active Ulcer/skin breakdown will have a volume reduction of 30% by week 4 Date Initiated: 08/16/2020 Date Inactivated: 03/03/2021 Target Resolution Date: 03/10/2021 Goal Status: Met Interventions: Provide education on ulcer and skin care Notes: 07/24/21: Wound care regimen ongoing. Electronic Signature(s) Signed: 10/07/2021 5:46:53 PM By: Baruch Gouty RN, BSN Entered By: Baruch Gouty on 10/07/2021 11:38:52 -------------------------------------------------------------------------------- Pain Assessment Details Patient Name: Date of Service: [name] [name] ND, [name] 10/07/2021 10:45 A M Medical Record Number: [id number] Patient Account Number: [id number] Date of Birth/Sex: Treating RN: Jul 10, 1971 (50 y.o. [name] [name] Primary Care Ferdinand Revoir: [name] Other Clinician: Referring Cuma Polyakov: Treating Casimiro Lienhard/Extender: Delano Metz in Treatment: 64 Active Problems Location of Pain Severity and Description of Pain Patient Has Paino Yes Site Locations Pain Location: Pain in Ulcers With Dressing Change: No Duration of the Pain. Constant / Intermittento Intermittent Rate the pain. Current Pain Level: 5 Worst Pain Level: 6 Least Pain Level: 0 Character of Pain Describe the Pain: Aching Pain  Management and Medication Current Pain Management: Rest: Yes Is the Current Pain Management Adequate: Adequate How does your wound impact your activities of daily livingo Sleep: No Bathing:  No Appetite: No Relationship With Others: No Bladder Continence: No Emotions: No Bowel Continence: No Work: No Toileting: No Drive: No Dressing: No Hobbies: No Electronic Signature(s) Signed: 10/07/2021 5:46:53 PM By: Baruch Gouty RN, BSN Entered By: Baruch Gouty on 10/07/2021 11:21:41 -------------------------------------------------------------------------------- Patient/Caregiver Education Details Patient Name: Date of Service: [name] [name] 11/15/2022andnbsp10:45 A M Medical Record Number: [id number] Patient Account Number: [id number] Date of Birth/Gender: Treating RN: October 11, 1971 (50 y.o. [name] [name] Primary Care Physician: [name] Other Clinician: Referring Physician: Treating Physician/Extender: Delano Metz in Treatment: 42 Education Assessment Education Provided To: Patient Education Topics Provided Offloading: Methods: Explain/Verbal Responses: Reinforcements needed, State content correctly Wound/Skin Impairment: Methods: Explain/Verbal Responses: Reinforcements needed, State content correctly Electronic Signature(s) Signed: 10/07/2021 5:46:53 PM By: Baruch Gouty RN, BSN Entered By: Baruch Gouty on 10/07/2021 11:39:35 -------------------------------------------------------------------------------- Wound Assessment Details Patient Name: Date of Service: [name] [name] ND, [name] 10/07/2021 10:45 A M Medical Record Number: [id number] Patient Account Number: [id number] Date of Birth/Sex: Treating RN: 1971-10-03 (50 y.o. [name] [name] Primary Care Moe Graca: [name] Other Clinician: Referring Reet Scharrer: Treating Jumar Greenstreet/Extender: Delano Metz in Treatment: 6 Wound Status Wound Number: 2R Primary Diabetic Wound/Ulcer of the Lower Extremity Etiology: Wound Location: Left Metatarsal head first Wound Status: Open Wounding Event: Blister Comorbid Anemia,  Hypertension, Peripheral Venous Disease, Type II Date Acquired: 06/23/2020 History: Diabetes Weeks Of Treatment: 59 Clustered Wound: No Photos Wound Measurements Length: (cm) 0.6 Width: (cm) 0.3 Depth: (cm) 0.3 Area: (cm) 0.141 Volume: (cm) 0.042 % Reduction in Area: -354.8% % Reduction in Volume: -1300% Epithelialization: None Tunneling: No Undermining: Yes Starting Position (o'clock): 5 Ending Position (o'clock): 12 Maximum Distance: (cm) 0.5 Wound Description Classification: Grade 2 Wound Margin: Well defined, not attached Exudate Amount: Medium Exudate Type: Serosanguineous Exudate Color: red, brown Foul Odor After Cleansing: No Slough/Fibrino No Wound Bed Granulation Amount: Large (67-100%) Exposed Structure Granulation Quality: Red Fascia Exposed: No Necrotic Amount: None Present (0%) Fat Layer (Subcutaneous Tissue) Exposed: Yes Tendon Exposed: No Muscle Exposed: No Joint Exposed: No Bone Exposed: No Treatment Notes Wound #2R (Metatarsal head first) Wound Laterality: Left Cleanser Soap and Water Discharge Instruction: May shower and wash wound with dial antibacterial soap and water prior to dressing change. Wound Cleanser Discharge Instruction: Cleanse the wound with wound cleanser prior to applying a clean dressing using gauze sponges, not tissue or cotton balls. Peri-Wound Care Sween Lotion (Moisturizing lotion) Discharge Instruction: Apply moisturizing lotion to leg Topical Primary Dressing Affinity Secondary Dressing Woven Gauze Sponges 2x2 in Discharge Instruction: Apply over primary dressing as directed. Optifoam Non-Adhesive Dressing, 4x4 in Discharge Instruction: cut to make foam donut to help offload ADAPTIC TOUCH 3x4.25 in Discharge Instruction: Apply over primary dressing as directed. Secured With Conforming Stretch Gauze Bandage, Sterile 2x75 (in/in) Discharge Instruction: Secure with stretch gauze as directed. 64M Medipore H Soft Cloth  Surgical T ape, 2x2 (in/yd) Discharge Instruction: Secure dressing with tape as directed. Compression Wrap Compression Stockings Add-Ons Electronic Signature(s) Signed: 10/07/2021 5:46:53 PM By: Baruch Gouty RN, BSN Entered By: Baruch Gouty on 10/07/2021 11:30:20 -------------------------------------------------------------------------------- Vitals Details Patient Name: Date of Service: [name] [name] ND, [name] 10/07/2021 10:45 A M Medical Record Number: [id number] Patient Account Number: [id number] Date of Birth/Sex: Treating RN: 02/14/1971 (50 y.o. [name] [name] Primary Care Keven Soucy: [name]  Other Clinician: Referring Anikka Marsan: Treating Ravenne Wayment/Extender: Delano Metz in Treatment: 46 Vital Signs Time Taken: 11:20 Temperature (F): 99.1 Height (in): 77 Pulse (bpm): 89 Source: Stated Respiratory Rate (breaths/min): 18 Weight (lbs): 232 Blood Pressure (mmHg): 141/96 Source: Stated Reference Range: 80 - 120 mg / dl Body Mass Index (BMI): 27.5 Electronic Signature(s) Signed: 10/07/2021 5:46:53 PM By: Baruch Gouty RN, BSN Entered By: Baruch Gouty on 10/07/2021 11:20:44

## 2021-10-07 NOTE — Progress Notes (Signed)
MORTON, SIMSON ([id number]) Visit Report for 10/07/2021 Chief Complaint Document Details Patient Name: Date of Service: [name] [name] 10/07/2021 10:45 A M Medical Record Number: [id number] Patient Account Number: [id number] Date of Birth/Sex: Treating RN: 07-12-1971 (50 y.o. [name] Primary Care Provider: [name] Other Clinician: Referring Provider: Treating Provider/Extender: Delano Metz in Treatment: 11 Information Obtained from: Patient Chief Complaint patient is here for a review of the wound on his plantar left first metatarsal head and back of his right leg Electronic Signature(s) Signed: 10/07/2021 12:02:50 PM By: Kalman Shan DO Entered By: Kalman Shan on 10/07/2021 11:59:25 -------------------------------------------------------------------------------- Cellular or Tissue Based Product Details Patient Name: Date of Service: [name], [name] 10/07/2021 10:45 A M Medical Record Number: [id number] Patient Account Number: [id number] Date of Birth/Sex: Treating RN: 04-Aug-1971 (50 y.o. [name] Primary Care Provider: [name] Other Clinician: Referring Provider: Treating Provider/Extender: Delano Metz in Treatment: 19 Cellular or Tissue Based Product Type Wound #2R Left Metatarsal head first Applied to: Performed By: Physician Kalman Shan, DO Cellular or Tissue Based Product Type: Affinity Level of Consciousness (Pre-procedure): Awake and Alert Pre-procedure Verification/Time Out Yes - 11:40 Taken: Location: genitalia / hands / feet / multiple digits Wound Size (sq cm): 0.18 Product Size (sq cm): 3 Waste Size (sq cm): 0 Amount of Product Applied (sq cm): 3 Instrument Used: Forceps Lot #: [id number] Order #: 1 Expiration Date: 10/08/2021 Fenestrated: No Reconstituted: No Secured: Yes Secured With: Steri-Strips Dressing Applied: Yes Primary  Dressing: adaptic, gauze Procedural Pain: 0 Post Procedural Pain: 0 Response to Treatment: Procedure was tolerated well Level of Consciousness (Post- Awake and Alert procedure): Post Procedure Diagnosis Same as Pre-procedure Electronic Signature(s) Signed: 10/07/2021 12:02:50 PM By: Kalman Shan DO Signed: 10/07/2021 5:46:53 PM By: Baruch Gouty RN, BSN Entered By: Baruch Gouty on 10/07/2021 11:43:37 -------------------------------------------------------------------------------- Debridement Details Patient Name: Date of Service: [name], [name] 10/07/2021 10:45 A M Medical Record Number: [id number] Patient Account Number: [id number] Date of Birth/Sex: Treating RN: September 10, 1971 (50 y.o. [name] Primary Care Provider: [name] Other Clinician: Referring Provider: Treating Provider/Extender: Delano Metz in Treatment: 59 Debridement Performed for Assessment: Wound #2R Left Metatarsal head first Performed By: Physician Kalman Shan, DO Debridement Type: Debridement Severity of Tissue Pre Debridement: Fat layer exposed Level of Consciousness (Pre-procedure): Awake and Alert Pre-procedure Verification/Time Out Yes - 11:30 Taken: Start Time: 11:32 T Area Debrided (L x W): otal 0.8 (cm) x 0.4 (cm) = 0.32 (cm) Tissue and other material debrided: Viable, Non-Viable, Callus, Slough, Subcutaneous, Skin: Epidermis, Slough Level: Skin/Subcutaneous Tissue Debridement Description: Excisional Instrument: Curette Bleeding: Minimum Hemostasis Achieved: Pressure End Time: 11:35 Procedural Pain: 0 Post Procedural Pain: 0 Response to Treatment: Procedure was tolerated well Level of Consciousness (Post- Awake and Alert procedure): Post Debridement Measurements of Total Wound Length: (cm) 0.8 Width: (cm) 0.4 Depth: (cm) 0.3 Volume: (cm) 0.075 Character of Wound/Ulcer Post Debridement: Improved Severity of Tissue Post  Debridement: Fat layer exposed Post Procedure Diagnosis Same as Pre-procedure Electronic Signature(s) Signed: 10/07/2021 12:02:50 PM By: Kalman Shan DO Signed: 10/07/2021 5:46:53 PM By: Baruch Gouty RN, BSN Entered By: Baruch Gouty on 10/07/2021 11:41:26 -------------------------------------------------------------------------------- HPI Details Patient Name: Date of Service: [name], [name] 10/07/2021 10:45 A M Medical Record Number: [id number] Patient Account Number: [id number] Date of Birth/Sex: Treating RN: 08-31-1971 (50 y.o. [name] Primary Care Provider: [name] Other Clinician: Referring Provider: Treating Provider/Extender:  Kalman Shan [name] Weeks in Treatment: 59 History of Present Illness HPI Description: 04/21/18 ADMISSION This is a 50 year old man who is a type II diabetic. In spite of fact his hemoglobin A1c is actually quite good 5.73 months ago he is at a really difficult time over the last 6-7 months. He developed a rapidly progressive infection in the right foot in November 2018 associated with osteomyelitis and necrotizing fasciitis. 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 The stump required and a revision on 11/24/17. The stump revision was advertised as being secondary to falls. I'm not sure the progressive history here however this area is actually closed over. The patient tells me over the same timeframe he has had wounds on the plantar aspect of his left foot. In the ED saw Dr. Sharol Given in April of this year. 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 It is really not clear that this patient is been dressing this with anything. He came in with the clinic without any specific dressing on the wound areas using his own tennis shoe. He tells me that he only ambulates of course to do a pivot transfer. He does not have his prosthesis for the right leg as of yet and he blames Medicaid  for this. He does however use the foot to push himself along in his wheelchair at home. The patient has not had formal arterial studies. ABI in our clinic on the left was 1.13. Patient's past medical history includes type 2 diabetes, fracture of the right fibula. I see that he was treated for abscesses on his right buttock and chest in 2016. I did not look at the microbiology of this. 04/28/18; patient comes back in the clinic today with the wound pretty much the same as when he came in here last week. Small opening lots of undermining relatively. He tells [place] that nothing is really been on this for 3 days in spite of the fact that we gave him enough to dress this easily especially such a small wound. He says he lives with his mother, she is not capable of assisting with this he is changing the dressings himself. 05/05/18; much better-looking wound today. Smaller. There is some undermining medially however that's only perhaps 2 mm. No surrounding erythema. We've been using silver collagen 05/12/18; small wound on the first metatarsal head. No undermining no surrounding erythema. We've been using silver collagen he has home health coming out to change 05/20/18 the wound on the first metatarsal head looks better. Covered in surface debris/callus nonviable tissue. Required debridement but post debridement this looks quite good. We've been using silver collagen. He has home health 05/27/18; first metatarsal head wound continues to improve. Just about completely closed. Still a lot of surface debridement callus. We've been using silver collagen 06/06/18; the first metatarsal wound is completely healed over. There is still a lot of callus. I gently removed some of this just to make sure that there was no open area and there is not. The patient mentions to me that his left leg has felt like "lead" for about a week READMISSION 08/16/2020 This is a 50 year old man with type 2 diabetes. He returns to clinic today  with a 1 month history of a blister and callus over the first metatarsal head. This is in exactly the same area as when he was here in 2019. 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 He has standard running shoes on the left foot to match the area in his prosthesis. He has only been covering this area with a Band-Aid has not really been specifically dressing this. ABI in our clinic on the left was 1.16. This is essentially stable from the value in 2019 10/1 the patient has a linear wound over the left first metatarsal head. Again not a lot of callus thick skin around this that I removed with a #3 curette. This is not go deep to bone or muscle it does not look to be infected. 10/8 first metatarsal head. The wound looks like it is closing however this is going to be a difficult area to offload in the future. He has a size 15 shoe and he says his shoes are years old. The inserts in these current shoes are totally useless and I have advised him to replace these. He may need a new pair of shoes and I have he got original prosthesis at [facility] I have asked him to go back there. 11/9; the patient has not been here in more than a month. Not sure he was healed when he was here the last time. I told him he would have to get new shoes and certainly offload the area over the left first metatarsal head. He has done nothing of the kind. He arrives back in clinic with the same old new balance sneakers. A very large separating callus over the first metatarsal head on the left 11/16; he has purchased new shoes and has at least 2 insoles like I asked. The wound is measuring much smaller. He is using silver alginate he changes the dressing himself 11/30; wound is measurably smaller in length of about a half a centimeter. This is generally very little depth. We have been using silver alginate. He changes the dressing himself every second day. 12/14; wound is  about the same size. Significant undermining medially relative to the size of the wound. We have been using silver alginate. There is not a way to offload this area as he walks with a prosthesis on the right leg 12/28; wound is about the same size. Again he has undermining medially. I am using polymen on this 12/02/2020; the wound looks smaller. Still a senescent edge. We have been using polymen 1/24; the wound is come down a few millimeters. Still a senescent edge. I have been using polymen 2/8; the wound is come down slightly. Still with slight undermining from 12-6 o'clock I have been using polymen partially to get offloading on this area which is opposed by his prosthesis on the other leg. 2/22 quite open improvement he still has a comma shaped wound on the left first metatarsal head. Some depth. Using polymen 3/14; he still has the same problem of a comma shaped area over the left first metatarsal head. This seems to have had more depth today at 0.6 cm. We have been using polymen. The patient changes this himself every second day. I explored the idea of a total contact cast on the left leg however this is his driving foot. He was not enamored with the idea of not being able to drive and states that he does not have anybody else to get him around 3/28; again he comes in with a smaller looking wound however there is depth and undermining requiring debridement. We have been using Hydrofera Blue, changed to silver collagen today. The patient is doing the dressing himself 4/11;  patient presents today for follow-up of his left diabetic foot wound. He denies any issues in the past 2 weeks. He is currently using silver collagen every other day with dressing changes. He does not use diabetic shoes or special inserts to his sneakers. He does not use felt pads for offloading. 4/19; patient presents for his 1 week follow-up. He denies any issues. He reports using Hydrofera Blue and has not been using silver  collagen for dressing changes. He reports minimal drainage to the wound. He overall feels well. 5/3; patient presents for 2-week follow-up. He has been using silver collagen without issues. He has no complaints today and states he overall feels well. 5/17; patient presents for 2-week follow-up. He states that the sleeve is rubbing up against the back of his right leg and causing a wound. He states this happened a week ago and has not been dressing the wound. The plantar wound is stable and he has been using collagen every other day. 5/24; Patient was had a recent hospitalization for cellulitis of his right popliteal fossa. He was admitted and started on IV antibiotics. He does not recall the plantar wound dressed while inpatient. He feels a lot better today. He was discharged on 5/21 on oral antibiotics and has not picked this up until today. He reports using collagen to the plantar wound since discharge. He is keeping the right posterior knee wound covered. He is getting a new sleeve for his prostatic on 5/26. He currently denies signs of infection. 6/1; patient presents for 1 week follow-up. He is still on doxycycline. He has not been dressing the back of the right knee wound. He states he is receiving his new prosthetic sleeve tomorrow. He states he is using silver alginate to the plantar foot wound. He currently denies signs of infection. He states he has not been offloading the left foot wound 6/7; patient presents for 1 week follow-up. He completes his last dose of doxycycline today. He finally received his prosthetic sleeve for the right side and he states it feels much better. He has been using antibiotic ointment to the back of that right knee wound. He has been using silver alginate to the plantar foot wound. He currently denies signs of infection. 6/21; patient presents for 2-week follow-up. He reports no issues with his prosthetic sleeve. He has been using antibiotic ointment to the back  of the right knee. He has been using silver alginate to the plantar foot wound. He has no issues or complaints today. He denies signs of infection. 7/5; patient presents for 2-week follow-up. He reports no issues or complaints today. He continues to use antibiotic ointment to the right posterior knee wound and silver alginate to the left plantar foot wound. He denies signs of infection. 7/19; patient presents for 2-week follow-up. He continues to use antibiotic ointment to the right posterior knee and silver alginate to the left plantar foot wound. He has no issues or complaints today. He denies signs of infection. 8/4; patient presents for 2-week follow-up. He has no issues or complaints today. He denies signs of infection. 8/18; patient presents for 2-week follow-up. He has no issues or concerns today. He has been approved for Apligraf and he would like to have this placed today. He denies signs of infection and reports that his right posterior knee wound is closed. 8/25; patient presents for 1 week follow-up. He had no issues with his first Apligraf placement last week. He did however develop an abscess to the  back of his right knee. He reports tenderness to this area. He denies systemic signs of infection. 9/1; patient presents for 1 week follow-up. He has had no issues with his plantar foot wound. The Apligraf has stayed in place over the past week. A wound culture was done at last clinic visit and a new antibiotic was sent to the pharmacy. Patient has not received this yet. He reports some mild tenderness to the back of his right knee. He denies systemic signs of infection. 9/8; patient presents for 1 week follow-up. He states he took Keflex and had diarrhea as a side effect. He was able to complete the course. He reports continued tenderness to the right posterior knee wound. He has some drainage still. He denies increased warmth or erythema to the area. He denies fever/chills or  nausea/vomiting. He has no issues or complaints today about his plantar foot wound. 9/15; patient presents for follow-up. He reports improvement in pain to the back of his right leg. He does not report any drainage. He had an ultrasound completed on 9/9 that showed a superficial fluid collection concerning for abscess. We attempted to call patient with results with no answer. We did put an urgent referral to general surgery and he reports he has not received a call. He currently denies systemic signs of infection. He reports no issues to his left plantar foot wound. 9/27; patient presents for follow-up. He has no issues or complaints today. He has had no issues with the previous wound to the back of his right leg. He has not made an appointment to see general surgery. He had Apligraf placed at last clinic visit to the plantar foot wound. He currently denies signs of infection. 10/7; patient presents for follow-up. Upon entering the room there is vomit throughout the entire floor. Patient states he does not feel well. I recommended he go to urgent care. I recommended following up next week for wound care. 10/10; patient presents for follow-up. He came last week for his follow-up however he was ill and started vomiting in the exam room. Today he states he feels better however he continues to have vomiting episodes. He states he attempted to go to urgent care/the ED but could not find a parking spot. He subsequently went home. He has no issues or complaints today regarding the foot wound. He never followed up with general surgery for the abscess identified on ultrasound to the back of his right knee. 10/20; patient presents for follow-up. He has no issues or complaints today. Overall he feels well today. 10/27; patient presents for follow-up. He has no issues or complaints today. 11/3; patient presents for follow-up. He has no issues or complaints today. He states he does not want to follow-up with  orthopedics to reevaluate the abscess in the back of his right leg. He denies signs of infection. 11/15; patient presents for follow-up. He has no issues or complaints today. He had been using silver alginate to the left first met head wound. He denies signs of infection. Electronic Signature(s) Signed: 10/07/2021 12:02:50 PM By: Kalman Shan DO Entered By: Kalman Shan on 10/07/2021 12:00:02 -------------------------------------------------------------------------------- Physical Exam Details Patient Name: Date of Service: [name], [name] 10/07/2021 10:45 A M Medical Record Number: [id number] Patient Account Number: [id number] Date of Birth/Sex: Treating RN: 1971-04-02 (50 y.o. [name] Primary Care Provider: [name] Other Clinician: Referring Provider: Treating Provider/Extender: Delano Metz in Treatment: 46 Constitutional respirations regular, non-labored and within target  range for patient.. Cardiovascular 2+ dorsalis pedis/posterior tibialis pulses. Psychiatric pleasant and cooperative. Notes Left leg: Open wound with granulation tissue and nonviable tissue present. Circumferential callus. Undermining to the 1 o'clock position. Electronic Signature(s) Signed: 10/07/2021 12:02:50 PM By: Kalman Shan DO Entered By: Kalman Shan on 10/07/2021 12:00:52 -------------------------------------------------------------------------------- Physician Orders Details Patient Name: Date of Service: [name], [name] 10/07/2021 10:45 A M Medical Record Number: [id number] Patient Account Number: [id number] Date of Birth/Sex: Treating RN: 11/26/1970 (50 y.o. [name] Primary Care Provider: [name] Other Clinician: Referring Provider: Treating Provider/Extender: Delano Metz in Treatment: 23 Verbal / Phone Orders: No Diagnosis Coding ICD-10 Coding Code Description [phone]  Non-pressure chronic ulcer of other part of left foot with fat layer exposed E11.621 Type 2 diabetes mellitus with foot ulcer E11.42 Type 2 diabetes mellitus with diabetic polyneuropathy Z89.511 Acquired absence of right leg below knee Follow-up Appointments ppointment in 1 week. - with Dr. Heber [place] - Tuesday Return A Cellular or Tissue Based Products Cellular or Tissue Based Product Type: - Affinity #1 Edema Control - Lymphedema / SCD / Other Elevate legs to the level of the heart or above for 30 minutes daily and/or when sitting, a frequency of: Avoid standing for long periods of time. Wound Treatment Wound #2R - Metatarsal head first Wound Laterality: Left Cleanser: Soap and Water 1 x Per Week/15 Days Discharge Instructions: May shower and wash wound with dial antibacterial soap and water prior to dressing change. Cleanser: Wound Cleanser (Generic) 1 x Per Week/15 Days Discharge Instructions: Cleanse the wound with wound cleanser prior to applying a clean dressing using gauze sponges, not tissue or cotton balls. Peri-Wound Care: Sween Lotion (Moisturizing lotion) 1 x Per Week/15 Days Discharge Instructions: Apply moisturizing lotion to leg Prim Dressing: Affinity ary 1 x Per Week/15 Days Secondary Dressing: Woven Gauze Sponges 2x2 in (Generic) 1 x Per Week/15 Days Discharge Instructions: Apply over primary dressing as directed. Secondary Dressing: Optifoam Non-Adhesive Dressing, 4x4 in (Generic) 1 x Per Week/15 Days Discharge Instructions: cut to make foam donut to help offload Secondary Dressing: ADAPTIC TOUCH 3x4.25 in 1 x Per Week/15 Days Discharge Instructions: Apply over primary dressing as directed. Secured With: Child psychotherapist, Sterile 2x75 (in/in) (Generic) 1 x Per Week/15 Days Discharge Instructions: Secure with stretch gauze as directed. Secured With: 40M Medipore H Soft Cloth Surgical Tape, 2x2 (in/yd) (Generic) 1 x Per Week/15 Days Discharge Instructions:  Secure dressing with tape as directed. Electronic Signature(s) Signed: 10/07/2021 12:02:50 PM By: Kalman Shan DO Entered By: Kalman Shan on 10/07/2021 12:01:15 -------------------------------------------------------------------------------- Problem List Details Patient Name: Date of Service: [name], [name] 10/07/2021 10:45 A M Medical Record Number: [id number] Patient Account Number: [id number] Date of Birth/Sex: Treating RN: [birth year] (50 y.o. [name] Primary Care Provider: [name] Other Clinician: Referring Provider: Treating Provider/Extender: Delano Metz in Treatment: 22 Active Problems ICD-10 Encounter Code Description Active Date MDM Diagnosis L97.522 Non-pressure chronic ulcer of other part of left foot with fat layer exposed 08/16/2020 No Yes E11.621 Type 2 diabetes mellitus with foot ulcer 08/16/2020 No Yes E11.42 Type 2 diabetes mellitus with diabetic polyneuropathy 08/16/2020 No Yes Z89.511 Acquired absence of right leg below knee 08/16/2020 No Yes Inactive Problems Resolved Problems ICD-10 Code Description Active Date Resolved Date S81.801D Unspecified open wound, right lower leg, subsequent encounter 04/08/2021 04/08/2021 Electronic Signature(s) Signed: 10/07/2021 12:02:50 PM By: Kalman Shan DO Entered By: Kalman Shan on 10/07/2021 11:57:44 -------------------------------------------------------------------------------- Progress  Note Details Patient Name: Date of Service: [name] [name] 10/07/2021 10:45 A M Medical Record Number: [id number] Patient Account Number: [id number] Date of Birth/Sex: Treating RN: [birth year] (50 y.o. [name] Primary Care Provider: [name] Other Clinician: Referring Provider: Treating Provider/Extender: Delano Metz in Treatment: 53 Subjective Chief Complaint Information obtained from Patient patient is here for  a review of the wound on his plantar left first metatarsal head and back of his right leg History of Present Illness (HPI) 04/21/18 ADMISSION This is a 50 year old man who is a type II diabetic. In spite of fact his hemoglobin A1c is actually quite good 5.73 months ago he is at a really difficult time over the last 6-7 months. He developed a rapidly progressive infection in the right foot in November 2018 associated with osteomyelitis and necrotizing fasciitis. 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 The stump required and a revision on 11/24/17. The stump revision was advertised as being secondary to falls. I'm not sure the progressive history here however this area is actually closed over. The patient tells me over the same timeframe he has had wounds on the plantar aspect of his left foot. In the ED saw Dr. Sharol Given in April of this year. 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 It is really not clear that this patient is been dressing this with anything. He came in with the clinic without any specific dressing on the wound areas using his own tennis shoe. He tells me that he only ambulates of course to do a pivot transfer. He does not have his prosthesis for the right leg as of yet and he blames Medicaid for this. He does however use the foot to push himself along in his wheelchair at home. The patient has not had formal arterial studies. ABI in our clinic on the left was 1.13. Patient's past medical history includes type 2 diabetes, fracture of the right fibula. I see that he was treated for abscesses on his right buttock and chest in 2016. I did not look at the microbiology of this. 04/28/18; patient comes back in the clinic today with the wound pretty much the same as when he came in here last week. Small opening lots of undermining relatively. He tells [place] that nothing is really been on this for 3 days in spite of the fact that we gave him enough to dress  this easily especially such a small wound. He says he lives with his mother, she is not capable of assisting with this he is changing the dressings himself. 05/05/18; much better-looking wound today. Smaller. There is some undermining medially however that's only perhaps 2 mm. No surrounding erythema. We've been using silver collagen 05/12/18; small wound on the first metatarsal head. No undermining no surrounding erythema. We've been using silver collagen he has home health coming out to change 05/20/18 the wound on the first metatarsal head looks better. Covered in surface debris/callus nonviable tissue. Required debridement but post debridement this looks quite good. We've been using silver collagen. He has home health 05/27/18; first metatarsal head wound continues to improve. Just about completely closed. Still a lot of surface debridement callus. We've been using silver collagen 06/06/18; the first metatarsal wound is completely healed over. There is still a lot of callus. I gently removed some of this just to make sure that there was no open area and there is not. The patient  mentions to me that his left leg has felt like "lead" for about a week READMISSION 08/16/2020 This is a 50 year old man with type 2 diabetes. He returns to clinic today with a 1 month history of a blister and callus over the first metatarsal head. This is in exactly the same area as when he was here in 2019. 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 He has standard running shoes on the left foot to match the area in his prosthesis. He has only been covering this area with a Band-Aid has not really been specifically dressing this. ABI in our clinic on the left was 1.16. This is essentially stable from the value in 2019 10/1 the patient has a linear wound over the left first metatarsal head. Again not a lot of callus thick skin around this that I removed with a #3 curette. This  is not go deep to bone or muscle it does not look to be infected. 10/8 first metatarsal head. The wound looks like it is closing however this is going to be a difficult area to offload in the future. He has a size 15 shoe and he says his shoes are years old. The inserts in these current shoes are totally useless and I have advised him to replace these. He may need a new pair of shoes and I have he got original prosthesis at [facility] I have asked him to go back there. 11/9; the patient has not been here in more than a month. Not sure he was healed when he was here the last time. I told him he would have to get new shoes and certainly offload the area over the left first metatarsal head. He has done nothing of the kind. He arrives back in clinic with the same old new balance sneakers. A very large separating callus over the first metatarsal head on the left 11/16; he has purchased new shoes and has at least 2 insoles like I asked. The wound is measuring much smaller. He is using silver alginate he changes the dressing himself 11/30; wound is measurably smaller in length of about a half a centimeter. This is generally very little depth. We have been using silver alginate. He changes the dressing himself every second day. 12/14; wound is about the same size. Significant undermining medially relative to the size of the wound. We have been using silver alginate. There is not a way to offload this area as he walks with a prosthesis on the right leg 12/28; wound is about the same size. Again he has undermining medially. I am using polymen on this 12/02/2020; the wound looks smaller. Still a senescent edge. We have been using polymen 1/24; the wound is come down a few millimeters. Still a senescent edge. I have been using polymen 2/8; the wound is come down slightly. Still with slight undermining from 12-6 o'clock I have been using polymen partially to get offloading on this area which is opposed  by his prosthesis on the other leg. 2/22 quite open improvement he still has a comma shaped wound on the left first metatarsal head. Some depth. Using polymen 3/14; he still has the same problem of a comma shaped area over the left first metatarsal head. This seems to have had more depth today at 0.6 cm. We have been using polymen. The patient changes this himself every second day. I explored the idea of a total  contact cast on the left leg however this is his driving foot. He was not enamored with the idea of not being able to drive and states that he does not have anybody else to get him around 3/28; again he comes in with a smaller looking wound however there is depth and undermining requiring debridement. We have been using Hydrofera Blue, changed to silver collagen today. The patient is doing the dressing himself 4/11; patient presents today for follow-up of his left diabetic foot wound. He denies any issues in the past 2 weeks. He is currently using silver collagen every other day with dressing changes. He does not use diabetic shoes or special inserts to his sneakers. He does not use felt pads for offloading. 4/19; patient presents for his 1 week follow-up. He denies any issues. He reports using Hydrofera Blue and has not been using silver collagen for dressing changes. He reports minimal drainage to the wound. He overall feels well. 5/3; patient presents for 2-week follow-up. He has been using silver collagen without issues. He has no complaints today and states he overall feels well. 5/17; patient presents for 2-week follow-up. He states that the sleeve is rubbing up against the back of his right leg and causing a wound. He states this happened a week ago and has not been dressing the wound. The plantar wound is stable and he has been using collagen every other day. 5/24; Patient was had a recent hospitalization for cellulitis of his right popliteal fossa. He was admitted and started on IV  antibiotics. He does not recall the plantar wound dressed while inpatient. He feels a lot better today. He was discharged on 5/21 on oral antibiotics and has not picked this up until today. He reports using collagen to the plantar wound since discharge. He is keeping the right posterior knee wound covered. He is getting a new sleeve for his prostatic on 5/26. He currently denies signs of infection. 6/1; patient presents for 1 week follow-up. He is still on doxycycline. He has not been dressing the back of the right knee wound. He states he is receiving his new prosthetic sleeve tomorrow. He states he is using silver alginate to the plantar foot wound. He currently denies signs of infection. He states he has not been offloading the left foot wound 6/7; patient presents for 1 week follow-up. He completes his last dose of doxycycline today. He finally received his prosthetic sleeve for the right side and he states it feels much better. He has been using antibiotic ointment to the back of that right knee wound. He has been using silver alginate to the plantar foot wound. He currently denies signs of infection. 6/21; patient presents for 2-week follow-up. He reports no issues with his prosthetic sleeve. He has been using antibiotic ointment to the back of the right knee. He has been using silver alginate to the plantar foot wound. He has no issues or complaints today. He denies signs of infection. 7/5; patient presents for 2-week follow-up. He reports no issues or complaints today. He continues to use antibiotic ointment to the right posterior knee wound and silver alginate to the left plantar foot wound. He denies signs of infection. 7/19; patient presents for 2-week follow-up. He continues to use antibiotic ointment to the right posterior knee and silver alginate to the left plantar foot wound. He has no issues or complaints today. He denies signs of infection. 8/4; patient presents for 2-week  follow-up. He has no issues or complaints  today. He denies signs of infection. 8/18; patient presents for 2-week follow-up. He has no issues or concerns today. He has been approved for Apligraf and he would like to have this placed today. He denies signs of infection and reports that his right posterior knee wound is closed. 8/25; patient presents for 1 week follow-up. He had no issues with his first Apligraf placement last week. He did however develop an abscess to the back of his right knee. He reports tenderness to this area. He denies systemic signs of infection. 9/1; patient presents for 1 week follow-up. He has had no issues with his plantar foot wound. The Apligraf has stayed in place over the past week. A wound culture was done at last clinic visit and a new antibiotic was sent to the pharmacy. Patient has not received this yet. He reports some mild tenderness to the back of his right knee. He denies systemic signs of infection. 9/8; patient presents for 1 week follow-up. He states he took Keflex and had diarrhea as a side effect. He was able to complete the course. He reports continued tenderness to the right posterior knee wound. He has some drainage still. He denies increased warmth or erythema to the area. He denies fever/chills or nausea/vomiting. He has no issues or complaints today about his plantar foot wound. 9/15; patient presents for follow-up. He reports improvement in pain to the back of his right leg. He does not report any drainage. He had an ultrasound completed on 9/9 that showed a superficial fluid collection concerning for abscess. We attempted to call patient with results with no answer. We did put an urgent referral to general surgery and he reports he has not received a call. He currently denies systemic signs of infection. He reports no issues to his left plantar foot wound. 9/27; patient presents for follow-up. He has no issues or complaints today. He has had no issues  with the previous wound to the back of his right leg. He has not made an appointment to see general surgery. He had Apligraf placed at last clinic visit to the plantar foot wound. He currently denies signs of infection. 10/7; patient presents for follow-up. Upon entering the room there is vomit throughout the entire floor. Patient states he does not feel well. I recommended he go to urgent care. I recommended following up next week for wound care. 10/10; patient presents for follow-up. He came last week for his follow-up however he was ill and started vomiting in the exam room. Today he states he feels better however he continues to have vomiting episodes. He states he attempted to go to urgent care/the ED but could not find a parking spot. He subsequently went home. He has no issues or complaints today regarding the foot wound. He never followed up with general surgery for the abscess identified on ultrasound to the back of his right knee. 10/20; patient presents for follow-up. He has no issues or complaints today. Overall he feels well today. 10/27; patient presents for follow-up. He has no issues or complaints today. 11/3; patient presents for follow-up. He has no issues or complaints today. He states he does not want to follow-up with orthopedics to reevaluate the abscess in the back of his right leg. He denies signs of infection. 11/15; patient presents for follow-up. He has no issues or complaints today. He had been using silver alginate to the left first met head wound. He denies signs of infection. Patient History Information obtained from Patient.  Family History Diabetes - Maternal Grandparents,Mother, Heart Disease - Father, Hypertension - Father, No family history of Cancer, Hereditary Spherocytosis, Kidney Disease, Lung Disease, Seizures, Stroke, Thyroid Problems, Tuberculosis. Social History Current every day smoker, Marital Status - Single, Alcohol Use - Never, Drug Use - No  History, Caffeine Use - Never. Medical History Eyes Denies history of Cataracts, Glaucoma, Optic Neuritis Ear/Nose/Mouth/Throat Denies history of Chronic sinus problems/congestion, Middle ear problems Hematologic/Lymphatic Patient has history of Anemia Denies history of Hemophilia, Human Immunodeficiency Virus, Lymphedema, Sickle Cell Disease Respiratory Denies history of Aspiration, Asthma, Chronic Obstructive Pulmonary Disease (COPD), Pneumothorax, Sleep Apnea, Tuberculosis Cardiovascular Patient has history of Hypertension, Peripheral Venous Disease Denies history of Angina, Arrhythmia, Congestive Heart Failure, Coronary Artery Disease, Deep Vein Thrombosis, Hypotension, Myocardial Infarction, Peripheral Arterial Disease, Phlebitis, Vasculitis Gastrointestinal Denies history of Cirrhosis , Colitis, Crohnoos, Hepatitis A, Hepatitis B, Hepatitis C Endocrine Patient has history of Type II Diabetes - oral meds Denies history of Type I Diabetes Genitourinary Denies history of End Stage Renal Disease Immunological Denies history of Lupus Erythematosus, Raynaudoos, Scleroderma Integumentary (Skin) Denies history of History of Burn Musculoskeletal Denies history of Gout, Rheumatoid Arthritis, Osteoarthritis, Osteomyelitis Neurologic Denies history of Dementia, Neuropathy, Quadriplegia, Paraplegia, Seizure Disorder Oncologic Denies history of Received Chemotherapy, Received Radiation Psychiatric Denies history of Anorexia/bulimia, Confinement Anxiety Hospitalization/Surgery History - fever 105, sepsis cellulitis right popliteal fossa 04/09/2021. Medical A Surgical History Notes nd Constitutional Symptoms (General Health) falls , leukocytosis Respiratory During waking hours and catches himself not breathing, "gasp for air". Saw Dr Wynonia Lawman, he couldn't find a reason Objective Constitutional respirations regular, non-labored and within target range for patient.. Vitals Time Taken:  11:20 AM, Height: 77 in, Source: Stated, Weight: 232 lbs, Source: Stated, BMI: 27.5, Temperature: 99.1 F, Pulse: 89 bpm, Respiratory Rate: 18 breaths/min, Blood Pressure: 141/96 mmHg. Cardiovascular 2+ dorsalis pedis/posterior tibialis pulses. Psychiatric pleasant and cooperative. General Notes: Left leg: Open wound with granulation tissue and nonviable tissue present. Circumferential callus. Undermining to the 1 o'clock position. Integumentary (Hair, Skin) Wound #2R status is Open. Original cause of wound was Blister. The date acquired was: 06/23/2020. The wound has been in treatment 59 weeks. The wound is located on the Left Metatarsal head first. The wound measures 0.6cm length x 0.3cm width x 0.3cm depth; 0.141cm^2 area and 0.042cm^3 volume. There is Fat Layer (Subcutaneous Tissue) exposed. There is no tunneling noted, however, there is undermining starting at 5:00 and ending at 12:00 with a maximum distance of 0.5cm. There is a medium amount of serosanguineous drainage noted. The wound margin is well defined and not attached to the wound base. There is large (67-100%) red granulation within the wound bed. There is no necrotic tissue within the wound bed. 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 I debrided nonviable tissue. No signs of infection on exam. Affinity #1 was placed in standard fashion today. Follow-up in 1 week. Procedures Wound #2R Pre-procedure diagnosis of Wound #2R is a Diabetic Wound/Ulcer of the Lower Extremity located on the Left Metatarsal head first .Severity of Tissue Pre Debridement is: Fat layer exposed. There was a Excisional Skin/Subcutaneous Tissue Debridement with a total area of 0.32 sq cm performed by Kalman Shan, DO. With  the following instrument(s): Curette to remove Viable and Non-Viable tissue/material. Material removed includes Callus, Subcutaneous Tissue,  Slough, and Skin: Epidermis. No specimens were taken. A time out was conducted at 11:30, prior to the start of the procedure. A Minimum amount of bleeding was controlled with Pressure. The procedure was tolerated well with a pain level of 0 throughout and a pain level of 0 following the procedure. Post Debridement Measurements: 0.8cm length x 0.4cm width x 0.3cm depth; 0.075cm^3 volume. Character of Wound/Ulcer Post Debridement is improved. Severity of Tissue Post Debridement is: Fat layer exposed. Post procedure Diagnosis Wound #2R: Same as Pre-Procedure Pre-procedure diagnosis of Wound #2R is a Diabetic Wound/Ulcer of the Lower Extremity located on the Left Metatarsal head first. A skin graft procedure using a bioengineered skin substitute/cellular or tissue based product was performed by Kalman Shan, DO with the following instrument(s): Forceps. Affinity was applied and secured with Steri-Strips. 3 sq cm of product was utilized and 0 sq cm was wasted. Post Application, adaptic, gauze was applied. A Time Out was conducted at 11:40, prior to the start of the procedure. The procedure was tolerated well with a pain level of 0 throughout and a pain level of 0 following the procedure. Post procedure Diagnosis Wound #2R: Same as Pre-Procedure . Plan Follow-up Appointments: Return Appointment in 1 week. - with Dr. Heber [place] - Tuesday Cellular or Tissue Based Products: Cellular or Tissue Based Product Type: - Affinity #1 Edema Control - Lymphedema / SCD / Other: Elevate legs to the level of the heart or above for 30 minutes daily and/or when sitting, a frequency of: Avoid standing for long periods of time. WOUND #2R: - Metatarsal head first Wound Laterality: Left Cleanser: Soap and Water 1 x Per Week/15 Days Discharge Instructions: May shower and wash wound  with dial antibacterial soap and water prior to dressing change. Cleanser: Wound Cleanser (Generic) 1 x Per Week/15 Days Discharge Instructions: Cleanse the wound with wound cleanser prior to applying a clean dressing using gauze sponges, not tissue or cotton balls. Peri-Wound Care: Sween Lotion (Moisturizing lotion) 1 x Per Week/15 Days Discharge Instructions: Apply moisturizing lotion to leg Prim Dressing: Affinity 1 x Per Week/15 Days ary Secondary Dressing: Woven Gauze Sponges 2x2 in (Generic) 1 x Per Week/15 Days Discharge Instructions: Apply over primary dressing as directed. Secondary Dressing: Optifoam Non-Adhesive Dressing, 4x4 in (Generic) 1 x Per Week/15 Days Discharge Instructions: cut to make foam donut to help offload Secondary Dressing: ADAPTIC TOUCH 3x4.25 in 1 x Per Week/15 Days Discharge Instructions: Apply over primary dressing as directed. Secured With: Child psychotherapist, Sterile 2x75 (in/in) (Generic) 1 x Per Week/15 Days Discharge Instructions: Secure with stretch gauze as directed. Secured With: 58M Medipore H Soft Cloth Surgical T ape, 2x2 (in/yd) (Generic) 1 x Per Week/15 Days Discharge Instructions: Secure dressing with tape as directed. 1. In office sharp debridement 2. Affinity #1 placed in standard fashion 3. Follow-up in 1 week Electronic Signature(s) Signed: 10/07/2021 12:02:50 PM By: Kalman Shan DO Entered By: Kalman Shan on 10/07/2021 12:02:19 -------------------------------------------------------------------------------- HxROS Details Patient Name: Date of Service: [name], [name] 10/07/2021 10:45 A M Medical Record Number: [id number] Patient Account Number: [id number] Date of Birth/Sex: Treating RN: 1971/02/06 (50 y.o. [name] Primary Care Provider: [name] Other Clinician: Referring Provider: Treating Provider/Extender: Delano Metz in Treatment: 59 Information Obtained  From Patient Constitutional Symptoms (General Health) Medical History: Past Medical History Notes: falls , leukocytosis Eyes Medical History: Negative for: Cataracts; Glaucoma; Optic Neuritis Ear/Nose/Mouth/Throat Medical History: Negative for: Chronic sinus problems/congestion; Middle ear problems Hematologic/Lymphatic  Medical History: Positive for: Anemia Negative for: Hemophilia; Human Immunodeficiency Virus; Lymphedema; Sickle Cell Disease Respiratory Medical History: Negative for: Aspiration; Asthma; Chronic Obstructive Pulmonary Disease (COPD); Pneumothorax; Sleep Apnea; Tuberculosis Past Medical History Notes: During waking hours and catches himself not breathing, "gasp for air". Saw Dr Wynonia Lawman, he couldn't find a reason Cardiovascular Medical History: Positive for: Hypertension; Peripheral Venous Disease Negative for: Angina; Arrhythmia; Congestive Heart Failure; Coronary Artery Disease; Deep Vein Thrombosis; Hypotension; Myocardial Infarction; Peripheral Arterial Disease; Phlebitis; Vasculitis Gastrointestinal Medical History: Negative for: Cirrhosis ; Colitis; Crohns; Hepatitis A; Hepatitis B; Hepatitis C Endocrine Medical History: Positive for: Type II Diabetes - oral meds Negative for: Type I Diabetes Time with diabetes: 2014 Treated with: Oral agents Blood sugar tested every day: Yes Tested : Blood sugar testing results: Breakfast: 91 Genitourinary Medical History: Negative for: End Stage Renal Disease Immunological Medical History: Negative for: Lupus Erythematosus; Raynauds; Scleroderma Integumentary (Skin) Medical History: Negative for: History of Burn Musculoskeletal Medical History: Negative for: Gout; Rheumatoid Arthritis; Osteoarthritis; Osteomyelitis Neurologic Medical History: Negative for: Dementia; Neuropathy; Quadriplegia; Paraplegia; Seizure Disorder Oncologic Medical History: Negative for: Received Chemotherapy; Received  Radiation Psychiatric Medical History: Negative for: Anorexia/bulimia; Confinement Anxiety Immunizations Pneumococcal Vaccine: Received Pneumococcal Vaccination: No Implantable Devices No devices added Hospitalization / Surgery History Type of Hospitalization/Surgery fever 105, sepsis cellulitis right popliteal fossa 04/09/2021 Family and Social History Cancer: No; Diabetes: Yes - Maternal Grandparents,Mother; Heart Disease: Yes - Father; Hereditary Spherocytosis: No; Hypertension: Yes - Father; Kidney Disease: No; Lung Disease: No; Seizures: No; Stroke: No; Thyroid Problems: No; Tuberculosis: No; Current every day smoker; Marital Status - Single; Alcohol Use: Never; Drug Use: No History; Caffeine Use: Never; Financial Concerns: No; Food, Clothing or Shelter Needs: No; Support System Lacking: No; Transportation Concerns: No Electronic Signature(s) Signed: 10/07/2021 12:02:50 PM By: Kalman Shan DO Signed: 10/07/2021 5:33:16 PM By: Deon Pilling RN, BSN Entered By: Kalman Shan on 10/07/2021 12:00:10 -------------------------------------------------------------------------------- SuperBill Details Patient Name: Date of Service: [name], [name] 10/07/2021 Medical Record Number: [id number] Patient Account Number: [id number] Date of Birth/Sex: Treating RN: [birth year] (50 y.o. [name] Primary Care Provider: [name] Other Clinician: Referring Provider: Treating Provider/Extender: Delano Metz in Treatment: 70 Diagnosis Coding ICD-10 Codes Code Description [phone] Non-pressure chronic ulcer of other part of left foot with fat layer exposed E11.621 Type 2 diabetes mellitus with foot ulcer S81.801D Unspecified open wound, right lower leg, subsequent encounter E11.42 Type 2 diabetes mellitus with diabetic polyneuropathy Z89.511 Acquired absence of right leg below knee Facility Procedures CPT4 Code: [id number] Description:  J1941 AFFINITY, 1.5X 1.5 SQ CM Modifier: Quantity: 3 CPT4 Code: [id number] Description: 18563 - SKIN SUB GRAFT FACE/NK/HF/G ICD-10 Diagnosis Description L97.522 Non-pressure chronic ulcer of other part of left foot with fat layer exposed E11.621 Type 2 diabetes mellitus with foot ulcer Modifier: Quantity: 1 Physician Procedures Electronic Signature(s) Signed: 10/07/2021 12:02:50 PM By: Kalman Shan DO Entered By: Kalman Shan on 10/07/2021 12:02:36

## 2021-10-14 ENCOUNTER — Encounter (HOSPITAL_BASED_OUTPATIENT_CLINIC_OR_DEPARTMENT_OTHER): Payer: Medicare Other | Admitting: Internal Medicine

## 2021-10-14 ENCOUNTER — Other Ambulatory Visit: Payer: Self-pay

## 2021-10-14 DIAGNOSIS — L97522 Non-pressure chronic ulcer of other part of left foot with fat layer exposed: Secondary | ICD-10-CM

## 2021-10-14 DIAGNOSIS — E11621 Type 2 diabetes mellitus with foot ulcer: Secondary | ICD-10-CM | POA: Diagnosis not present

## 2021-10-15 NOTE — Progress Notes (Signed)
ELIS, SAUBER ([id number]) Visit Report for 10/14/2021 Arrival Information Details Patient Name: Date of Service: [name] [name] 10/14/2021 12:45 PM Medical Record Number: [id number] Patient Account Number: [id number] Date of Birth/Sex: Treating RN: 08-Oct-1971 (50 y.o. [name] [name], [name] [name] Primary Care Jevante Hollibaugh: [name] Other Clinician: Referring Sherryl Valido: Treating Bryahna Lesko/Extender: Delano Metz in Treatment: 58 Visit Information History Since Last Visit Added or deleted any medications: No Patient Arrived: Ambulatory Any new allergies or adverse reactions: No Arrival Time: 12:47 Had a fall or experienced change in No Accompanied By: self activities of daily living that may affect Transfer Assistance: Manual risk of falls: Patient Identification Verified: Yes Signs or symptoms of abuse/neglect since last visito No Secondary Verification Process Completed: Yes Hospitalized since last visit: No Patient Requires Transmission-Based Precautions: No Implantable device outside of the clinic excluding No Patient Has Alerts: No cellular tissue based products placed in the center since last visit: Has Dressing in Place as Prescribed: Yes Pain Present Now: No Electronic Signature(s) Signed: 10/15/2021 3:00:15 PM By: Rhae Hammock RN Entered By: Rhae Hammock on 10/14/2021 12:48:07 -------------------------------------------------------------------------------- Encounter Discharge Information Details Patient Name: Date of Service: [name] [name] ND, [name] 10/14/2021 12:45 PM Medical Record Number: [id number] Patient Account Number: [id number] Date of Birth/Sex: Treating RN: 1971-05-22 (50 y.o. [name] [name], [name] [name] Primary Care Tara Rud: [name] Other Clinician: Referring Kyron Schlitt: Treating Radhika Dershem/Extender: Delano Metz in Treatment: 31 Encounter Discharge Information Items Post Procedure  Vitals Discharge Condition: Stable Temperature (F): 97.4 Ambulatory Status: Ambulatory Pulse (bpm): 74 Discharge Destination: Home Respiratory Rate (breaths/min): 17 Transportation: Private Auto Blood Pressure (mmHg): 147/74 Accompanied By: self Schedule Follow-up Appointment: Yes Clinical Summary of Care: Patient Declined Electronic Signature(s) Signed: 10/15/2021 3:00:15 PM By: Rhae Hammock RN Entered By: Rhae Hammock on 10/14/2021 13:45:49 -------------------------------------------------------------------------------- Lower Extremity Assessment Details Patient Name: Date of Service: [name], [name] 10/14/2021 12:45 PM Medical Record Number: [id number] Patient Account Number: [id number] Date of Birth/Sex: Treating RN: 08/19/1971 (50 y.o. [name] [name], [name] [name] Primary Care Monish Haliburton: [name] Other Clinician: Referring Liya Strollo: Treating Exander Shaul/Extender: Delano Metz in Treatment: 60 Edema Assessment Assessed: [name]: Yes] [name]: No] Edema: [Left: N] [Right: o] Calf Left: Right: Point of Measurement: 50 cm From Medial Instep 39.8 cm Ankle Left: Right: Point of Measurement: 11 cm From Medial Instep 21.2 cm Vascular Assessment Pulses: Dorsalis Pedis Palpable: [Left:Yes] Posterior Tibial Palpable: [Left:Yes] Electronic Signature(s) Signed: 10/15/2021 3:00:15 PM By: Rhae Hammock RN Entered By: Rhae Hammock on 10/14/2021 12:48:41 -------------------------------------------------------------------------------- Multi Wound Chart Details Patient Name: Date of Service: [name] [name] ND, [name] 10/14/2021 12:45 PM Medical Record Number: [id number] Patient Account Number: [id number] Date of Birth/Sex: Treating RN: 1971-03-31 (50 y.o. [name] [name], [name] Primary Care Molly Savarino: [name] Other Clinician: Referring Devi Hopman: Treating Geneen Dieter/Extender: Delano Metz in Treatment:  60 Vital Signs Height(in): 77 Pulse(bpm): 96 Weight(lbs): 232 Blood Pressure(mmHg): 129/74 Body Mass Index(BMI): 28 Temperature(F): 98.7 Respiratory Rate(breaths/min): 17 Photos: [2R:Left Metatarsal head first] [N/A:N/A N/A] Wound Location: [2R:Blister] [N/A:N/A] Wounding Event: [2R:Diabetic Wound/Ulcer of the Lower] [N/A:N/A] Primary Etiology: [2R:Extremity Anemia, Hypertension, Peripheral] [N/A:N/A] Comorbid History: [2R:Venous Disease, Type II Diabetes 06/23/2020] [N/A:N/A] Date Acquired: [2R:60] [N/A:N/A] Weeks of Treatment: [2R:Open] [N/A:N/A] Wound Status: [2R:Yes] [N/A:N/A] Wound Recurrence: [2R:1x0.4x0.6] [N/A:N/A] Measurements L x W x D (cm) [2R:0.314] [N/A:N/A] A (cm) : rea [2R:0.188] [N/A:N/A] Volume (cm) : [2R:-912.90%] [N/A:N/A] % Reduction in A [2R:rea: -6166.70%] [N/A:N/A] % Reduction in Volume: [2R:12] Starting Position 1 (o'clock): [2R:6] Ending Position  1 (o'clock): [2R:0.6] Maximum Distance 1 (cm): [2R:Yes] [N/A:N/A] Undermining: [2R:Grade 2] [N/A:N/A] Classification: [2R:Medium] [N/A:N/A] Exudate A mount: [2R:Serosanguineous] [N/A:N/A] Exudate Type: [2R:red, brown] [N/A:N/A] Exudate Color: [2R:Well defined, not attached] [N/A:N/A] Wound Margin: [2R:Large (67-100%)] [N/A:N/A] Granulation A mount: [2R:Red] [N/A:N/A] Granulation Quality: [2R:None Present (0%)] [N/A:N/A] Necrotic A mount: [2R:Fat Layer (Subcutaneous Tissue): Yes N/A] Exposed Structures: [2R:Fascia: No Tendon: No Muscle: No Joint: No Bone: No None] [N/A:N/A] Epithelialization: [2R:Debridement - Excisional] [N/A:N/A] Debridement: Pre-procedure Verification/Time Out 11:18 [N/A:N/A] Taken: [2R:Lidocaine] [N/A:N/A] Pain Control: [2R:Subcutaneous] [N/A:N/A] Tissue Debrided: [2R:Skin/Subcutaneous Tissue] [N/A:N/A] Level: [2R:0.4] [N/A:N/A] Debridement A (sq cm): [2R:rea Curette] [N/A:N/A] Instrument: [2R:Minimum] [N/A:N/A] Bleeding: [2R:Pressure] [N/A:N/A] Hemostasis A chieved: [2R:0]  [N/A:N/A] Procedural Pain: [2R:0] [N/A:N/A] Post Procedural Pain: [2R:Procedure was tolerated well] [N/A:N/A] Debridement Treatment Response: [2R:1x0.4x0.6] [N/A:N/A] Post Debridement Measurements L x W x D (cm) [2R:0.188] [N/A:N/A] Post Debridement Volume: (cm) [2R:Cellular or Tissue Based Product] [N/A:N/A] Procedures Performed: [2R:Debridement] Treatment Notes Wound #2R (Metatarsal head first) Wound Laterality: Left Cleanser Soap and Water Discharge Instruction: May shower and wash wound with dial antibacterial soap and water prior to dressing change. Wound Cleanser Discharge Instruction: Cleanse the wound with wound cleanser prior to applying a clean dressing using gauze sponges, not tissue or cotton balls. Peri-Wound Care Sween Lotion (Moisturizing lotion) Discharge Instruction: Apply moisturizing lotion to leg Topical Primary Dressing Affinity Secondary Dressing Woven Gauze Sponges 2x2 in Discharge Instruction: Apply over primary dressing as directed. Optifoam Non-Adhesive Dressing, 4x4 in Discharge Instruction: cut to make foam donut to help offload ADAPTIC TOUCH 3x4.25 in Discharge Instruction: Apply over primary dressing as directed. Secured With Conforming Stretch Gauze Bandage, Sterile 2x75 (in/in) Discharge Instruction: Secure with stretch gauze as directed. 73M Medipore H Soft Cloth Surgical T ape, 2x2 (in/yd) Discharge Instruction: Secure dressing with tape as directed. Compression Wrap Compression Stockings Add-Ons Electronic Signature(s) Signed: 10/14/2021 1:50:13 PM By: Kalman Shan DO Signed: 10/14/2021 5:20:05 PM By: Deon Pilling RN, BSN Entered By: Kalman Shan on 10/14/2021 13:46:25 -------------------------------------------------------------------------------- Multi-Disciplinary Care Plan Details Patient Name: Date of Service: [name] [name] ND, [name] 10/14/2021 12:45 PM Medical Record Number: [id number] Patient Account Number:  [id number] Date of Birth/Sex: Treating RN: 1971-09-02 (50 y.o. [name] [name], [name] [name] Primary Care Oswin Johal: [name] Other Clinician: Referring Sukanya Goldblatt: Treating Holden Draughon/Extender: Delano Metz in Treatment: [facility] reviewed with physician Active Inactive Wound/Skin Impairment Nursing Diagnoses: Impaired tissue integrity Goals: Patient/caregiver will verbalize understanding of skin care regimen Date Initiated: 11/19/2020 Target Resolution Date: 10/24/2021 Goal Status: Active Ulcer/skin breakdown will have a volume reduction of 30% by week 4 Date Initiated: 08/16/2020 Date Inactivated: 03/03/2021 Target Resolution Date: 03/10/2021 Goal Status: Met Interventions: Provide education on ulcer and skin care Notes: 07/24/21: Wound care regimen ongoing. Electronic Signature(s) Signed: 10/15/2021 3:00:15 PM By: Rhae Hammock RN Entered By: Rhae Hammock on 10/14/2021 13:36:36 -------------------------------------------------------------------------------- Pain Assessment Details Patient Name: Date of Service: [name] [name] ND, [name] 10/14/2021 12:45 PM Medical Record Number: [id number] Patient Account Number: [id number] Date of Birth/Sex: Treating RN: 1971/08/31 (50 y.o. [name] [name], [name] [name] Primary Care Reta Norgren: [name] Other Clinician: Referring Kevork Joyce: Treating Blondine Hottel/Extender: Delano Metz in Treatment: 53 Active Problems Location of Pain Severity and Description of Pain Patient Has Paino No Site Locations Pain Management and Medication Current Pain Management: Electronic Signature(s) Signed: 10/15/2021 3:00:15 PM By: Rhae Hammock RN Entered By: Rhae Hammock on 10/14/2021 12:48:34 -------------------------------------------------------------------------------- Patient/Caregiver Education Details Patient Name: Date of Service: [name], Em[place]  11/22/2022andnbsp12:45 PM Medical Record Number:  [id number] Patient Account Number: [id number] Date of Birth/Gender: Treating RN: 05-Mar-1971 (50 y.o. [name] Primary Care Physician: [name] Other Clinician: Referring Physician: Treating Physician/Extender: Delano Metz in Treatment: 63 Education Assessment Education Provided To: Patient Education Topics Provided Wound/Skin Impairment: Methods: Explain/Verbal Responses: State content correctly [employer]) Signed: 10/15/2021 3:00:15 PM By: Rhae Hammock RN Entered By: Rhae Hammock on 10/14/2021 13:38:18 -------------------------------------------------------------------------------- Wound Assessment Details Patient Name: Date of Service: [name] [name] ND, [name] 10/14/2021 12:45 PM Medical Record Number: [id number] Patient Account Number: [id number] Date of Birth/Sex: Treating RN: 26-Dec-1970 (50 y.o. [name] [name], [name] [name] Primary Care Shamarie Call: [name] Other Clinician: Referring Coy Rochford: Treating Engelbert Sevin/Extender: Delano Metz in Treatment: 60 Wound Status Wound Number: 2R Primary Diabetic Wound/Ulcer of the Lower Extremity Etiology: Wound Location: Left Metatarsal head first Wound Status: Open Wounding Event: Blister Comorbid Anemia, Hypertension, Peripheral Venous Disease, Type II Date Acquired: 06/23/2020 History: Diabetes Weeks Of Treatment: 60 Clustered Wound: No Photos Wound Measurements Length: (cm) 1 Width: (cm) 0.4 Depth: (cm) 0.6 Area: (cm) 0.314 Volume: (cm) 0.188 % Reduction in Area: -912.9% % Reduction in Volume: -6166.7% Epithelialization: None Tunneling: No Undermining: Yes Starting Position (o'clock): 12 Ending Position (o'clock): 6 Maximum Distance: (cm) 0.6 Wound Description Classification: Grade 2 Wound Margin: Well defined, not attached Exudate Amount: Medium Exudate Type:  Serosanguineous Exudate Color: red, brown Foul Odor After Cleansing: No Slough/Fibrino No Wound Bed Granulation Amount: Large (67-100%) Exposed Structure Granulation Quality: Red Fascia Exposed: No Necrotic Amount: None Present (0%) Fat Layer (Subcutaneous Tissue) Exposed: Yes Tendon Exposed: No Muscle Exposed: No Joint Exposed: No Bone Exposed: No Treatment Notes Wound #2R (Metatarsal head first) Wound Laterality: Left Cleanser Soap and Water Discharge Instruction: May shower and wash wound with dial antibacterial soap and water prior to dressing change. Wound Cleanser Discharge Instruction: Cleanse the wound with wound cleanser prior to applying a clean dressing using gauze sponges, not tissue or cotton balls. Peri-Wound Care Sween Lotion (Moisturizing lotion) Discharge Instruction: Apply moisturizing lotion to leg Topical Primary Dressing Affinity Secondary Dressing Woven Gauze Sponges 2x2 in Discharge Instruction: Apply over primary dressing as directed. Optifoam Non-Adhesive Dressing, 4x4 in Discharge Instruction: cut to make foam donut to help offload ADAPTIC TOUCH 3x4.25 in Discharge Instruction: Apply over primary dressing as directed. Secured With Conforming Stretch Gauze Bandage, Sterile 2x75 (in/in) Discharge Instruction: Secure with stretch gauze as directed. 67M Medipore H Soft Cloth Surgical T ape, 2x2 (in/yd) Discharge Instruction: Secure dressing with tape as directed. Compression Wrap Compression Stockings Add-Ons Electronic Signature(s) Signed: 10/15/2021 3:00:15 PM By: Rhae Hammock RN Entered By: Rhae Hammock on 10/14/2021 12:55:34 -------------------------------------------------------------------------------- Vitals Details Patient Name: Date of Service: [name], [name] 10/14/2021 12:45 PM Medical Record Number: [id number] Patient Account Number: [id number] Date of Birth/Sex: Treating RN: January 21, 1971 (50 y.o. [name] [name],  [name] [name] Primary Care Charisma Charlot: [name] Other Clinician: Referring Lillieann Pavlich: Treating Carrel Leather/Extender: Delano Metz in Treatment: 60 Vital Signs Time Taken: 12:48 Temperature (F): 98.7 Height (in): 77 Pulse (bpm): 96 Weight (lbs): 232 Respiratory Rate (breaths/min): 17 Body Mass Index (BMI): 27.5 Blood Pressure (mmHg): 129/74 Reference Range: 80 - 120 mg / dl Electronic Signature(s) Signed: 10/15/2021 3:00:15 PM By: Rhae Hammock RN Entered By: Rhae Hammock on 10/14/2021 12:48:28

## 2021-10-15 NOTE — Progress Notes (Signed)
[name] [name] ([id number]) Visit Report for 10/14/2021 Chief Complaint Document Details Patient Name: Date of Service: [name] [name] 10/14/2021 12:45 PM Medical Record Number: [id number] Patient Account Number: [id number] Date of Birth/Sex: Treating RN: 11-05-1971 (50 y.o. [name] [name] Primary Care Provider: [name] Other Clinician: Referring Provider: Treating Provider/Extender: Delano Metz in Treatment: 62 Information Obtained from: Patient Chief Complaint patient is here for a review of the wound on his plantar left first metatarsal head and back of his right leg Electronic Signature(s) Signed: 10/14/2021 1:50:13 PM By: Kalman Shan DO Entered By: Kalman Shan on 10/14/2021 13:46:35 -------------------------------------------------------------------------------- Cellular or Tissue Based Product Details Patient Name: Date of Service: [name] [name], [name] [name] 10/14/2021 12:45 PM Medical Record Number: [id number] Patient Account Number: [id number] Date of Birth/Sex: Treating RN: 1970-12-18 (50 y.o. [name] [name], [name] [name] Primary Care Provider: [name] Other Clinician: Referring Provider: Treating Provider/Extender: Delano Metz in Treatment: 4 Cellular or Tissue Based Product Type Wound #2R Left Metatarsal head first Applied to: Performed By: Physician Kalman Shan, DO Cellular or Tissue Based Product Type: Affinity Level of Consciousness (Pre-procedure): Awake and Alert Pre-procedure Verification/Time Out Yes - 11:19 Taken: Location: genitalia / hands / feet / multiple digits Wound Size (sq cm): 0.4 Product Size (sq cm): 2.25 Waste Size (sq cm): 0 Amount of Product Applied (sq cm): 2.25 Instrument Used: Forceps Lot #: [id number] Expiration Date: 10/16/2021 Fenestrated: No Reconstituted: No Secured: Yes Secured With: Steri-Strips Dressing Applied: Yes Primary Dressing:  adaptic Procedural Pain: 0 Post Procedural Pain: 0 Response to Treatment: Procedure was tolerated well Level of Consciousness (Post- Awake and Alert procedure): Post Procedure Diagnosis Same as Pre-procedure Electronic Signature(s) Signed: 10/14/2021 4:42:48 PM By: Kalman Shan DO Signed: 10/15/2021 3:00:15 PM By: Rhae Hammock RN Previous Signature: 10/14/2021 1:50:13 PM Version By: Kalman Shan DO Entered By: Rhae Hammock on 10/14/2021 16:27:40 -------------------------------------------------------------------------------- Debridement Details Patient Name: Date of Service: [name] [name], [name] 10/14/2021 12:45 PM Medical Record Number: [id number] Patient Account Number: [id number] Date of Birth/Sex: Treating RN: July 03, 1971 (50 y.o. [name] [name], [name] [name] Primary Care Provider: [name] Other Clinician: Referring Provider: Treating Provider/Extender: Delano Metz in Treatment: 60 Debridement Performed for Assessment: Wound #2R Left Metatarsal head first Performed By: Physician Kalman Shan, DO Debridement Type: Debridement Severity of Tissue Pre Debridement: Fat layer exposed Level of Consciousness (Pre-procedure): Awake and Alert Pre-procedure Verification/Time Out Yes - 11:18 Taken: Start Time: 11:18 Pain Control: Lidocaine T Area Debrided (L x W): otal 1 (cm) x 0.4 (cm) = 0.4 (cm) Tissue and other material debrided: Viable, Non-Viable, Subcutaneous, Skin: Dermis , Skin: Epidermis Level: Skin/Subcutaneous Tissue Debridement Description: Excisional Instrument: Curette Bleeding: Minimum Hemostasis Achieved: Pressure End Time: 11:18 Procedural Pain: 0 Post Procedural Pain: 0 Response to Treatment: Procedure was tolerated well Level of Consciousness (Post- Awake and Alert procedure): Post Debridement Measurements of Total Wound Length: (cm) 1 Width: (cm) 0.4 Depth: (cm) 0.6 Volume: (cm) 0.188 Character of  Wound/Ulcer Post Debridement: Improved Severity of Tissue Post Debridement: Fat layer exposed Post Procedure Diagnosis Same as Pre-procedure Electronic Signature(s) Signed: 10/14/2021 1:50:13 PM By: Kalman Shan DO Signed: 10/15/2021 3:00:15 PM By: Rhae Hammock RN Entered By: Rhae Hammock on 10/14/2021 13:40:42 -------------------------------------------------------------------------------- HPI Details Patient Name: Date of Service: [name] [name], [name] 10/14/2021 12:45 PM Medical Record Number: [id number] Patient Account Number: [id number] Date of Birth/Sex: Treating RN: 07/25/1971 (50 y.o. [name] [name] Primary Care Provider: Other Clinician: [name] Referring  Provider: Treating Provider/Extender: Delano Metz in Treatment: 53 History of Present Illness HPI Description: 04/21/18 ADMISSION This is a 49 year old man who is a type II diabetic. In spite of fact his hemoglobin A1c is actually quite good 5.73 months ago he is at a really difficult time over the last 6-7 months. He developed a rapidly progressive infection in the right foot in November 2018 associated with osteomyelitis and necrotizing fasciitis. 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 The stump required and a revision on 11/24/17. The stump revision was advertised as being secondary to falls. I'm not sure the progressive history here however this area is actually closed over. The patient tells me over the same timeframe he has had wounds on the plantar aspect of his left foot. In the ED saw Dr. Sharol Given in April of this year. 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 It is really not clear that this patient is been dressing this with anything. He came in with the clinic without any specific dressing on the wound areas using his own tennis shoe. He tells me that he only ambulates of course to do a pivot transfer. He does not have his  prosthesis for the right leg as of yet and he blames Medicaid for this. He does however use the foot to push himself along in his wheelchair at home. The patient has not had formal arterial studies. ABI in our clinic on the left was 1.13. Patient's past medical history includes type 2 diabetes, fracture of the right fibula. I see that he was treated for abscesses on his right buttock and chest in 2016. I did not look at the microbiology of this. 04/28/18; patient comes back in the clinic today with the wound pretty much the same as when he came in here last week. Small opening lots of undermining relatively. He tells [place] that nothing is really been on this for 3 days in spite of the fact that we gave him enough to dress this easily especially such a small wound. He says he lives with his mother, she is not capable of assisting with this he is changing the dressings himself. 05/05/18; much better-looking wound today. Smaller. There is some undermining medially however that's only perhaps 2 mm. No surrounding erythema. We've been using silver collagen 05/12/18; small wound on the first metatarsal head. No undermining no surrounding erythema. We've been using silver collagen he has home health coming out to change 05/20/18 the wound on the first metatarsal head looks better. Covered in surface debris/callus nonviable tissue. Required debridement but post debridement this looks quite good. We've been using silver collagen. He has home health 05/27/18; first metatarsal head wound continues to improve. Just about completely closed. Still a lot of surface debridement callus. We've been using silver collagen 06/06/18; the first metatarsal wound is completely healed over. There is still a lot of callus. I gently removed some of this just to make sure that there was no open area and there is not. The patient mentions to me that his left leg has felt like "lead" for about a week READMISSION 08/16/2020 This is a  50 year old man with type 2 diabetes. He returns to clinic today with a 1 month history of a blister and callus over the first metatarsal head. This is in exactly the same area as when he was here in 2019. He has a right BKA and a prosthesis from a diabetic  foot infection on the right in 2018. He has standard running shoes on the left foot to match the area in his prosthesis. He has only been covering this area with a Band-Aid has not really been specifically dressing this. ABI in our clinic on the left was 1.16. This is essentially stable from the value in 2019 10/1 the patient has a linear wound over the left first metatarsal head. Again not a lot of callus thick skin around this that I removed with a #3 curette. This is not go deep to bone or muscle it does not look to be infected. 10/8 first metatarsal head. The wound looks like it is closing however this is going to be a difficult area to offload in the future. He has a size 15 shoe and he says his shoes are years old. The inserts in these current shoes are totally useless and I have advised him to replace these. He may need a new pair of shoes and I have he got original prosthesis at [facility] I have asked him to go back there. 11/9; the patient has not been here in more than a month. Not sure he was healed when he was here the last time. I told him he would have to get new shoes and certainly offload the area over the left first metatarsal head. He has done nothing of the kind. He arrives back in clinic with the same old new balance sneakers. A very large separating callus over the first metatarsal head on the left 11/16; he has purchased new shoes and has at least 2 insoles like I asked. The wound is measuring much smaller. He is using silver alginate he changes the dressing himself 11/30; wound is measurably smaller in length of about a half a centimeter. This is generally very little depth. We have been using silver alginate. He  changes the dressing himself every second day. 12/14; wound is about the same size. Significant undermining medially relative to the size of the wound. We have been using silver alginate. There is not a way to offload this area as he walks with a prosthesis on the right leg 12/28; wound is about the same size. Again he has undermining medially. I am using polymen on this 12/02/2020; the wound looks smaller. Still a senescent edge. We have been using polymen 1/24; the wound is come down a few millimeters. Still a senescent edge. I have been using polymen 2/8; the wound is come down slightly. Still with slight undermining from 12-6 o'clock I have been using polymen partially to get offloading on this area which is opposed by his prosthesis on the other leg. 2/22 quite open improvement he still has a comma shaped wound on the left first metatarsal head. Some depth. Using polymen 3/14; he still has the same problem of a comma shaped area over the left first metatarsal head. This seems to have had more depth today at 0.6 cm. We have been using polymen. The patient changes this himself every second day. I explored the idea of a total contact cast on the left leg however this is his driving foot. He was not enamored with the idea of not being able to drive and states that he does not have anybody else to get him around 3/28; again he comes in with a smaller looking wound however there is depth and undermining requiring debridement. We have been using Hydrofera Blue, changed to silver collagen today. The patient is doing the  dressing himself 4/11; patient presents today for follow-up of his left diabetic foot wound. He denies any issues in the past 2 weeks. He is currently using silver collagen every other day with dressing changes. He does not use diabetic shoes or special inserts to his sneakers. He does not use felt pads for offloading. 4/19; patient presents for his 1 week follow-up. He denies any  issues. He reports using Hydrofera Blue and has not been using silver collagen for dressing changes. He reports minimal drainage to the wound. He overall feels well. 5/3; patient presents for 2-week follow-up. He has been using silver collagen without issues. He has no complaints today and states he overall feels well. 5/17; patient presents for 2-week follow-up. He states that the sleeve is rubbing up against the back of his right leg and causing a wound. He states this happened a week ago and has not been dressing the wound. The plantar wound is stable and he has been using collagen every other day. 5/24; Patient was had a recent hospitalization for cellulitis of his right popliteal fossa. He was admitted and started on IV antibiotics. He does not recall the plantar wound dressed while inpatient. He feels a lot better today. He was discharged on 5/21 on oral antibiotics and has not picked this up until today. He reports using collagen to the plantar wound since discharge. He is keeping the right posterior knee wound covered. He is getting a new sleeve for his prostatic on 5/26. He currently denies signs of infection. 6/1; patient presents for 1 week follow-up. He is still on doxycycline. He has not been dressing the back of the right knee wound. He states he is receiving his new prosthetic sleeve tomorrow. He states he is using silver alginate to the plantar foot wound. He currently denies signs of infection. He states he has not been offloading the left foot wound 6/7; patient presents for 1 week follow-up. He completes his last dose of doxycycline today. He finally received his prosthetic sleeve for the right side and he states it feels much better. He has been using antibiotic ointment to the back of that right knee wound. He has been using silver alginate to the plantar foot wound. He currently denies signs of infection. 6/21; patient presents for 2-week follow-up. He reports no issues with his  prosthetic sleeve. He has been using antibiotic ointment to the back of the right knee. He has been using silver alginate to the plantar foot wound. He has no issues or complaints today. He denies signs of infection. 7/5; patient presents for 2-week follow-up. He reports no issues or complaints today. He continues to use antibiotic ointment to the right posterior knee wound and silver alginate to the left plantar foot wound. He denies signs of infection. 7/19; patient presents for 2-week follow-up. He continues to use antibiotic ointment to the right posterior knee and silver alginate to the left plantar foot wound. He has no issues or complaints today. He denies signs of infection. 8/4; patient presents for 2-week follow-up. He has no issues or complaints today. He denies signs of infection. 8/18; patient presents for 2-week follow-up. He has no issues or concerns today. He has been approved for Apligraf and he would like to have this placed today. He denies signs of infection and reports that his right posterior knee wound is closed. 8/25; patient presents for 1 week follow-up. He had no issues with his first Apligraf placement last week. He did however develop an  abscess to the back of his right knee. He reports tenderness to this area. He denies systemic signs of infection. 9/1; patient presents for 1 week follow-up. He has had no issues with his plantar foot wound. The Apligraf has stayed in place over the past week. A wound culture was done at last clinic visit and a new antibiotic was sent to the pharmacy. Patient has not received this yet. He reports some mild tenderness to the back of his right knee. He denies systemic signs of infection. 9/8; patient presents for 1 week follow-up. He states he took Keflex and had diarrhea as a side effect. He was able to complete the course. He reports continued tenderness to the right posterior knee wound. He has some drainage still. He denies increased  warmth or erythema to the area. He denies fever/chills or nausea/vomiting. He has no issues or complaints today about his plantar foot wound. 9/15; patient presents for follow-up. He reports improvement in pain to the back of his right leg. He does not report any drainage. He had an ultrasound completed on 9/9 that showed a superficial fluid collection concerning for abscess. We attempted to call patient with results with no answer. We did put an urgent referral to general surgery and he reports he has not received a call. He currently denies systemic signs of infection. He reports no issues to his left plantar foot wound. 9/27; patient presents for follow-up. He has no issues or complaints today. He has had no issues with the previous wound to the back of his right leg. He has not made an appointment to see general surgery. He had Apligraf placed at last clinic visit to the plantar foot wound. He currently denies signs of infection. 10/7; patient presents for follow-up. Upon entering the room there is vomit throughout the entire floor. Patient states he does not feel well. I recommended he go to urgent care. I recommended following up next week for wound care. 10/10; patient presents for follow-up. He came last week for his follow-up however he was ill and started vomiting in the exam room. Today he states he feels better however he continues to have vomiting episodes. He states he attempted to go to urgent care/the ED but could not find a parking spot. He subsequently went home. He has no issues or complaints today regarding the foot wound. He never followed up with general surgery for the abscess identified on ultrasound to the back of his right knee. 10/20; patient presents for follow-up. He has no issues or complaints today. Overall he feels well today. 10/27; patient presents for follow-up. He has no issues or complaints today. 11/3; patient presents for follow-up. He has no issues or  complaints today. He states he does not want to follow-up with orthopedics to reevaluate the abscess in the back of his right leg. He denies signs of infection. 11/15; patient presents for follow-up. He has no issues or complaints today. He had been using silver alginate to the left first met head wound. He denies signs of infection. 11/22; patient presents for follow-up. He had Affinity placed at last clinic visit. He has no issues or complaints today. Electronic Signature(s) Signed: 10/14/2021 1:50:13 PM By: Kalman Shan DO Entered By: Kalman Shan on 10/14/2021 13:47:02 -------------------------------------------------------------------------------- Physical Exam Details Patient Name: Date of Service: [name] [name], [name] 10/14/2021 12:45 PM Medical Record Number: [id number] Patient Account Number: [id number] Date of Birth/Sex: Treating RN: 1971/07/05 (50 y.o. [name] [name] Primary Care Provider: Burt Ek,  Seth Bake Other Clinician: Referring Provider: Treating Provider/Extender: Delano Metz in Treatment: 60 Constitutional respirations regular, non-labored and within target range for patient.. Cardiovascular 2+ dorsalis pedis/posterior tibialis pulses. Psychiatric pleasant and cooperative. Notes Left leg: Open wound with granulation tissue and nonviable tissue present. Post debridement there is no undermining noted. No signs of infection. Electronic Signature(s) Signed: 10/14/2021 1:50:13 PM By: Kalman Shan DO Entered By: Kalman Shan on 10/14/2021 13:47:44 -------------------------------------------------------------------------------- Physician Orders Details Patient Name: Date of Service: [name] [name], [name] 10/14/2021 12:45 PM Medical Record Number: [id number] Patient Account Number: [id number] Date of Birth/Sex: Treating RN: Jul 22, 1971 (50 y.o. [name] [name], [name] [name] Primary Care Provider: [name] Other  Clinician: Referring Provider: Treating Provider/Extender: Delano Metz in Treatment: 82 Verbal / Phone Orders: No Diagnosis Coding ICD-10 Coding Code Description [phone] Non-pressure chronic ulcer of other part of left foot with fat layer exposed E11.621 Type 2 diabetes mellitus with foot ulcer E11.42 Type 2 diabetes mellitus with diabetic polyneuropathy Z89.511 Acquired absence of right leg below knee Follow-up Appointments ppointment in 1 week. - with Dr. Heber [place] - Tuesday Return A Cellular or Tissue Based Products Cellular or Tissue Based Product Type: - Affinity #2 Cellular or Tissue Based Product applied to wound bed, secured with steri-strips, cover with Adaptic or Mepitel. (DO NOT REMOVE). Edema Control - Lymphedema / SCD / Other Elevate legs to the level of the heart or above for 30 minutes daily and/or when sitting, a frequency of: Avoid standing for long periods of time. Wound Treatment Wound #2R - Metatarsal head first Wound Laterality: Left Cleanser: Soap and Water 1 x Per Week/15 Days Discharge Instructions: May shower and wash wound with dial antibacterial soap and water prior to dressing change. Cleanser: Wound Cleanser (Generic) 1 x Per Week/15 Days Discharge Instructions: Cleanse the wound with wound cleanser prior to applying a clean dressing using gauze sponges, not tissue or cotton balls. Peri-Wound Care: Sween Lotion (Moisturizing lotion) 1 x Per Week/15 Days Discharge Instructions: Apply moisturizing lotion to leg Prim Dressing: Affinity ary 1 x Per Week/15 Days Secondary Dressing: Woven Gauze Sponges 2x2 in (Generic) 1 x Per Week/15 Days Discharge Instructions: Apply over primary dressing as directed. Secondary Dressing: Optifoam Non-Adhesive Dressing, 4x4 in (Generic) 1 x Per Week/15 Days Discharge Instructions: cut to make foam donut to help offload Secondary Dressing: ADAPTIC TOUCH 3x4.25 in 1 x Per Week/15 Days Discharge  Instructions: Apply over primary dressing as directed. Secured With: Child psychotherapist, Sterile 2x75 (in/in) (Generic) 1 x Per Week/15 Days Discharge Instructions: Secure with stretch gauze as directed. Secured With: 72M Medipore H Soft Cloth Surgical Tape, 2x2 (in/yd) (Generic) 1 x Per Week/15 Days Discharge Instructions: Secure dressing with tape as directed. Electronic Signature(s) Signed: 10/14/2021 1:50:13 PM By: Kalman Shan DO Entered By: Kalman Shan on 10/14/2021 13:48:02 -------------------------------------------------------------------------------- Problem List Details Patient Name: Date of Service: [name], [name] 10/14/2021 12:45 PM Medical Record Number: [id number] Patient Account Number: [id number] Date of Birth/Sex: Treating RN: 20-Aug-1971 (50 y.o. [name] [name] Primary Care Provider: [name] Other Clinician: Referring Provider: Treating Provider/Extender: Delano Metz in Treatment: 43 Active Problems ICD-10 Encounter Code Description Active Date MDM Diagnosis [phone] Non-pressure chronic ulcer of other part of left foot with fat layer exposed 08/16/2020 No Yes E11.621 Type 2 diabetes mellitus with foot ulcer 08/16/2020 No Yes E11.42 Type 2 diabetes mellitus with diabetic polyneuropathy 08/16/2020 No Yes Z89.511 Acquired absence of right leg below knee 08/16/2020  No Yes Inactive Problems Resolved Problems ICD-10 Code Description Active Date Resolved Date S81.801D Unspecified open wound, right lower leg, subsequent encounter 04/08/2021 04/08/2021 Electronic Signature(s) Signed: 10/14/2021 1:50:13 PM By: Kalman Shan DO Entered By: Kalman Shan on 10/14/2021 13:46:18 -------------------------------------------------------------------------------- Progress Note Details Patient Name: Date of Service: Lometa [name], [name] 10/14/2021 12:45 PM Medical Record Number: [id number] Patient Account  Number: [id number] Date of Birth/Sex: Treating RN: 13-Aug-1971 (50 y.o. [name] [name] Primary Care Provider: [name] Other Clinician: Referring Provider: Treating Provider/Extender: Delano Metz in Treatment: 15 Subjective Chief Complaint Information obtained from Patient patient is here for a review of the wound on his plantar left first metatarsal head and back of his right leg History of Present Illness (HPI) 04/21/18 ADMISSION This is a 50 year old man who is a type II diabetic. In spite of fact his hemoglobin A1c is actually quite good 5.73 months ago he is at a really difficult time over the last 6-7 months. He developed a rapidly progressive infection in the right foot in November 2018 associated with osteomyelitis and necrotizing fasciitis. 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 The stump required and a revision on 11/24/17. The stump revision was advertised as being secondary to falls. I'm not sure the progressive history here however this area is actually closed over. The patient tells me over the same timeframe he has had wounds on the plantar aspect of his left foot. In the ED saw Dr. Sharol Given in April of this year. 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 It is really not clear that this patient is been dressing this with anything. He came in with the clinic without any specific dressing on the wound areas using his own tennis shoe. He tells me that he only ambulates of course to do a pivot transfer. He does not have his prosthesis for the right leg as of yet and he blames Medicaid for this. He does however use the foot to push himself along in his wheelchair at home. The patient has not had formal arterial studies. ABI in our clinic on the left was 1.13. Patient's past medical history includes type 2 diabetes, fracture of the right fibula. I see that he was treated for abscesses on his right buttock and  chest in 2016. I did not look at the microbiology of this. 04/28/18; patient comes back in the clinic today with the wound pretty much the same as when he came in here last week. Small opening lots of undermining relatively. He tells [place] that nothing is really been on this for 3 days in spite of the fact that we gave him enough to dress this easily especially such a small wound. He says he lives with his mother, she is not capable of assisting with this he is changing the dressings himself. 05/05/18; much better-looking wound today. Smaller. There is some undermining medially however that's only perhaps 2 mm. No surrounding erythema. We've been using silver collagen 05/12/18; small wound on the first metatarsal head. No undermining no surrounding erythema. We've been using silver collagen he has home health coming out to change 05/20/18 the wound on the first metatarsal head looks better. Covered in surface debris/callus nonviable tissue. Required debridement but post debridement this looks quite good. We've been using silver collagen. He has home health 05/27/18; first metatarsal head wound continues to improve. Just about completely closed. Still a lot of surface debridement callus. We've  been using silver collagen 06/06/18; the first metatarsal wound is completely healed over. There is still a lot of callus. I gently removed some of this just to make sure that there was no open area and there is not. The patient mentions to me that his left leg has felt like "lead" for about a week READMISSION 08/16/2020 This is a 50 year old man with type 2 diabetes. He returns to clinic today with a 1 month history of a blister and callus over the first metatarsal head. This is in exactly the same area as when he was here in 2019. 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 He has standard running shoes on the left foot to match the area in his prosthesis. He has only been  covering this area with a Band-Aid has not really been specifically dressing this. ABI in our clinic on the left was 1.16. This is essentially stable from the value in 2019 10/1 the patient has a linear wound over the left first metatarsal head. Again not a lot of callus thick skin around this that I removed with a #3 curette. This is not go deep to bone or muscle it does not look to be infected. 10/8 first metatarsal head. The wound looks like it is closing however this is going to be a difficult area to offload in the future. He has a size 15 shoe and he says his shoes are years old. The inserts in these current shoes are totally useless and I have advised him to replace these. He may need a new pair of shoes and I have he got original prosthesis at [facility] I have asked him to go back there. 11/9; the patient has not been here in more than a month. Not sure he was healed when he was here the last time. I told him he would have to get new shoes and certainly offload the area over the left first metatarsal head. He has done nothing of the kind. He arrives back in clinic with the same old new balance sneakers. A very large separating callus over the first metatarsal head on the left 11/16; he has purchased new shoes and has at least 2 insoles like I asked. The wound is measuring much smaller. He is using silver alginate he changes the dressing himself 11/30; wound is measurably smaller in length of about a half a centimeter. This is generally very little depth. We have been using silver alginate. He changes the dressing himself every second day. 12/14; wound is about the same size. Significant undermining medially relative to the size of the wound. We have been using silver alginate. There is not a way to offload this area as he walks with a prosthesis on the right leg 12/28; wound is about the same size. Again he has undermining medially. I am using polymen on this 12/02/2020; the wound  looks smaller. Still a senescent edge. We have been using polymen 1/24; the wound is come down a few millimeters. Still a senescent edge. I have been using polymen 2/8; the wound is come down slightly. Still with slight undermining from 12-6 o'clock I have been using polymen partially to get offloading on this area which is opposed by his prosthesis on the other leg. 2/22 quite open improvement he still has a comma shaped wound on the left first metatarsal head. Some depth. Using polymen 3/14; he still has the same problem  of a comma shaped area over the left first metatarsal head. This seems to have had more depth today at 0.6 cm. We have been using polymen. The patient changes this himself every second day. I explored the idea of a total contact cast on the left leg however this is his driving foot. He was not enamored with the idea of not being able to drive and states that he does not have anybody else to get him around 3/28; again he comes in with a smaller looking wound however there is depth and undermining requiring debridement. We have been using Hydrofera Blue, changed to silver collagen today. The patient is doing the dressing himself 4/11; patient presents today for follow-up of his left diabetic foot wound. He denies any issues in the past 2 weeks. He is currently using silver collagen every other day with dressing changes. He does not use diabetic shoes or special inserts to his sneakers. He does not use felt pads for offloading. 4/19; patient presents for his 1 week follow-up. He denies any issues. He reports using Hydrofera Blue and has not been using silver collagen for dressing changes. He reports minimal drainage to the wound. He overall feels well. 5/3; patient presents for 2-week follow-up. He has been using silver collagen without issues. He has no complaints today and states he overall feels well. 5/17; patient presents for 2-week follow-up. He states that the sleeve is rubbing  up against the back of his right leg and causing a wound. He states this happened a week ago and has not been dressing the wound. The plantar wound is stable and he has been using collagen every other day. 5/24; Patient was had a recent hospitalization for cellulitis of his right popliteal fossa. He was admitted and started on IV antibiotics. He does not recall the plantar wound dressed while inpatient. He feels a lot better today. He was discharged on 5/21 on oral antibiotics and has not picked this up until today. He reports using collagen to the plantar wound since discharge. He is keeping the right posterior knee wound covered. He is getting a new sleeve for his prostatic on 5/26. He currently denies signs of infection. 6/1; patient presents for 1 week follow-up. He is still on doxycycline. He has not been dressing the back of the right knee wound. He states he is receiving his new prosthetic sleeve tomorrow. He states he is using silver alginate to the plantar foot wound. He currently denies signs of infection. He states he has not been offloading the left foot wound 6/7; patient presents for 1 week follow-up. He completes his last dose of doxycycline today. He finally received his prosthetic sleeve for the right side and he states it feels much better. He has been using antibiotic ointment to the back of that right knee wound. He has been using silver alginate to the plantar foot wound. He currently denies signs of infection. 6/21; patient presents for 2-week follow-up. He reports no issues with his prosthetic sleeve. He has been using antibiotic ointment to the back of the right knee. He has been using silver alginate to the plantar foot wound. He has no issues or complaints today. He denies signs of infection. 7/5; patient presents for 2-week follow-up. He reports no issues or complaints today. He continues to use antibiotic ointment to the right posterior knee wound and silver alginate to the  left plantar foot wound. He denies signs of infection. 7/19; patient presents for 2-week follow-up. He continues  to use antibiotic ointment to the right posterior knee and silver alginate to the left plantar foot wound. He has no issues or complaints today. He denies signs of infection. 8/4; patient presents for 2-week follow-up. He has no issues or complaints today. He denies signs of infection. 8/18; patient presents for 2-week follow-up. He has no issues or concerns today. He has been approved for Apligraf and he would like to have this placed today. He denies signs of infection and reports that his right posterior knee wound is closed. 8/25; patient presents for 1 week follow-up. He had no issues with his first Apligraf placement last week. He did however develop an abscess to the back of his right knee. He reports tenderness to this area. He denies systemic signs of infection. 9/1; patient presents for 1 week follow-up. He has had no issues with his plantar foot wound. The Apligraf has stayed in place over the past week. A wound culture was done at last clinic visit and a new antibiotic was sent to the pharmacy. Patient has not received this yet. He reports some mild tenderness to the back of his right knee. He denies systemic signs of infection. 9/8; patient presents for 1 week follow-up. He states he took Keflex and had diarrhea as a side effect. He was able to complete the course. He reports continued tenderness to the right posterior knee wound. He has some drainage still. He denies increased warmth or erythema to the area. He denies fever/chills or nausea/vomiting. He has no issues or complaints today about his plantar foot wound. 9/15; patient presents for follow-up. He reports improvement in pain to the back of his right leg. He does not report any drainage. He had an ultrasound completed on 9/9 that showed a superficial fluid collection concerning for abscess. We attempted to call patient  with results with no answer. We did put an urgent referral to general surgery and he reports he has not received a call. He currently denies systemic signs of infection. He reports no issues to his left plantar foot wound. 9/27; patient presents for follow-up. He has no issues or complaints today. He has had no issues with the previous wound to the back of his right leg. He has not made an appointment to see general surgery. He had Apligraf placed at last clinic visit to the plantar foot wound. He currently denies signs of infection. 10/7; patient presents for follow-up. Upon entering the room there is vomit throughout the entire floor. Patient states he does not feel well. I recommended he go to urgent care. I recommended following up next week for wound care. 10/10; patient presents for follow-up. He came last week for his follow-up however he was ill and started vomiting in the exam room. Today he states he feels better however he continues to have vomiting episodes. He states he attempted to go to urgent care/the ED but could not find a parking spot. He subsequently went home. He has no issues or complaints today regarding the foot wound. He never followed up with general surgery for the abscess identified on ultrasound to the back of his right knee. 10/20; patient presents for follow-up. He has no issues or complaints today. Overall he feels well today. 10/27; patient presents for follow-up. He has no issues or complaints today. 11/3; patient presents for follow-up. He has no issues or complaints today. He states he does not want to follow-up with orthopedics to reevaluate the abscess in the back of his right  leg. He denies signs of infection. 11/15; patient presents for follow-up. He has no issues or complaints today. He had been using silver alginate to the left first met head wound. He denies signs of infection. 11/22; patient presents for follow-up. He had Affinity placed at last clinic  visit. He has no issues or complaints today. Patient History Information obtained from Patient. Family History Diabetes - Maternal Grandparents,Mother, Heart Disease - Father, Hypertension - Father, No family history of Cancer, Hereditary Spherocytosis, Kidney Disease, Lung Disease, Seizures, Stroke, Thyroid Problems, Tuberculosis. Social History Current every day smoker, Marital Status - Single, Alcohol Use - Never, Drug Use - No History, Caffeine Use - Never. Medical History Eyes Denies history of Cataracts, Glaucoma, Optic Neuritis Ear/Nose/Mouth/Throat Denies history of Chronic sinus problems/congestion, Middle ear problems Hematologic/Lymphatic Patient has history of Anemia Denies history of Hemophilia, Human Immunodeficiency Virus, Lymphedema, Sickle Cell Disease Respiratory Denies history of Aspiration, Asthma, Chronic Obstructive Pulmonary Disease (COPD), Pneumothorax, Sleep Apnea, Tuberculosis Cardiovascular Patient has history of Hypertension, Peripheral Venous Disease Denies history of Angina, Arrhythmia, Congestive Heart Failure, Coronary Artery Disease, Deep Vein Thrombosis, Hypotension, Myocardial Infarction, Peripheral Arterial Disease, Phlebitis, Vasculitis Gastrointestinal Denies history of Cirrhosis , Colitis, Crohnoos, Hepatitis A, Hepatitis B, Hepatitis C Endocrine Patient has history of Type II Diabetes - oral meds Denies history of Type I Diabetes Genitourinary Denies history of End Stage Renal Disease Immunological Denies history of Lupus Erythematosus, Raynaudoos, Scleroderma Integumentary (Skin) Denies history of History of Burn Musculoskeletal Denies history of Gout, Rheumatoid Arthritis, Osteoarthritis, Osteomyelitis Neurologic Denies history of Dementia, Neuropathy, Quadriplegia, Paraplegia, Seizure Disorder Oncologic Denies history of Received Chemotherapy, Received Radiation Psychiatric Denies history of Anorexia/bulimia, Confinement  Anxiety Hospitalization/Surgery History - fever 105, sepsis cellulitis right popliteal fossa 04/09/2021. Medical A Surgical History Notes [name] Constitutional Symptoms (General Health) falls , leukocytosis Respiratory During waking hours and catches himself not breathing, "gasp for air". Saw Dr Wynonia Lawman, he couldn't find a reason Objective Constitutional respirations regular, non-labored and within target range for patient.. Vitals Time Taken: 12:48 PM, Height: 77 in, Weight: 232 lbs, BMI: 27.5, Temperature: 98.7 F, Pulse: 96 bpm, Respiratory Rate: 17 breaths/min, Blood Pressure: 129/74 mmHg. Cardiovascular 2+ dorsalis pedis/posterior tibialis pulses. Psychiatric pleasant and cooperative. General Notes: Left leg: Open wound with granulation tissue and nonviable tissue present. Post debridement there is no undermining noted. No signs of infection. Integumentary (Hair, Skin) Wound #2R status is Open. Original cause of wound was Blister. The date acquired was: 06/23/2020. The wound has been in treatment 60 weeks. The wound is located on the Left Metatarsal head first. The wound measures 1cm length x 0.4cm width x 0.6cm depth; 0.314cm^2 area and 0.188cm^3 volume. There is Fat Layer (Subcutaneous Tissue) exposed. There is no tunneling noted, however, there is undermining starting at 12:00 and ending at 6:00 with a maximum distance of 0.6cm. There is a medium amount of serosanguineous drainage noted. The wound margin is well defined and not attached to the wound base. There is large (67-100%) red granulation within the wound bed. There is no necrotic tissue within the wound bed. 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 No signs of infection on exam. I debrided  nonviable tissue. Affinity #2 was placed today in standard fashion. I think he did better with Apligraf and we will try and  switch back to this. Follow-up in 1 week. Procedures Wound #2R Pre-procedure diagnosis of Wound #2R is a Diabetic Wound/Ulcer of the Lower Extremity located on the Left Metatarsal head first .Severity of Tissue Pre Debridement is: Fat layer exposed. There was a Excisional Skin/Subcutaneous Tissue Debridement with a total area of 0.4 sq cm performed by Kalman Shan, DO. With the following instrument(s): Curette to remove Viable and Non-Viable tissue/material. Material removed includes Subcutaneous Tissue, Skin: Dermis, and Skin: Epidermis after achieving pain control using Lidocaine. No specimens were taken. A time out was conducted at 11:18, prior to the start of the procedure. A Minimum amount of bleeding was controlled with Pressure. The procedure was tolerated well with a pain level of 0 throughout and a pain level of 0 following the procedure. Post Debridement Measurements: 1cm length x 0.4cm width x 0.6cm depth; 0.188cm^3 volume. Character of Wound/Ulcer Post Debridement is improved. Severity of Tissue Post Debridement is: Fat layer exposed. Post procedure Diagnosis Wound #2R: Same as Pre-Procedure Pre-procedure diagnosis of Wound #2R is a Diabetic Wound/Ulcer of the Lower Extremity located on the Left Metatarsal head first. A skin graft procedure using a bioengineered skin substitute/cellular or tissue based product was performed by Kalman Shan, DO with the following instrument(s): Forceps. Affinity was applied and secured with Steri-Strips. 2.25 sq cm of product was utilized and 0 sq cm was wasted. Post Application, adaptic was applied. A Time Out was conducted at 11:19, prior to the start of the procedure. The procedure was tolerated well with a pain level of 0 throughout and a pain level of 0 following the procedure. Post procedure Diagnosis Wound #2R: Same as  Pre-Procedure . Plan Follow-up Appointments: Return Appointment in 1 week. - with Dr. Heber [place] - Tuesday Cellular or Tissue Based Products: Cellular or Tissue Based Product Type: - Affinity #2 Cellular or Tissue Based Product applied to wound bed, secured with steri-strips, cover with Adaptic or Mepitel. (DO NOT REMOVE). Edema Control - Lymphedema / SCD / Other: Elevate legs to the level of the heart or above for 30 minutes daily and/or when sitting, a frequency of: Avoid standing for long periods of time. WOUND #2R: - Metatarsal head first Wound Laterality: Left Cleanser: Soap and Water 1 x Per Week/15 Days Discharge Instructions: May shower and wash wound with dial antibacterial soap and water prior to dressing change. Cleanser: Wound Cleanser (Generic) 1 x Per Week/15 Days Discharge Instructions: Cleanse the wound with wound cleanser prior to applying a clean dressing using gauze sponges, not tissue or cotton balls. Peri-Wound Care: Sween Lotion (Moisturizing lotion) 1 x Per Week/15 Days Discharge Instructions: Apply moisturizing lotion to leg Prim Dressing: Affinity 1 x Per Week/15 Days ary Secondary Dressing: Woven Gauze Sponges 2x2 in (Generic) 1 x Per Week/15 Days Discharge Instructions: Apply over primary dressing as directed. Secondary Dressing: Optifoam Non-Adhesive Dressing, 4x4 in (Generic) 1 x Per Week/15 Days Discharge Instructions: cut to make foam donut to help offload Secondary Dressing: ADAPTIC TOUCH 3x4.25 in 1 x Per Week/15 Days Discharge Instructions: Apply over primary dressing as directed. Secured With: Child psychotherapist, Sterile 2x75 (in/in) (Generic) 1 x Per Week/15 Days Discharge Instructions: Secure with stretch gauze as directed. Secured With: 84M Medipore H Soft Cloth Surgical T ape, 2x2 (in/yd) (Generic) 1 x Per Week/15 Days Discharge Instructions: Secure dressing with tape as directed. 1. In office sharp debridement 2. Affinity #2 placed in  standard fashion 3. Aggressive offloading 4. Follow-up in 1 week Electronic Signature(s) Signed: 10/14/2021 1:50:13  PM By: Kalman Shan DO Entered By: Kalman Shan on 10/14/2021 13:49:36 -------------------------------------------------------------------------------- HxROS Details Patient Name: Date of Service: [name] [name], [name] 10/14/2021 12:45 PM Medical Record Number: [id number] Patient Account Number: [id number] Date of Birth/Sex: Treating RN: 04/18/1971 (50 y.o. [name] [name] Primary Care Provider: [name] Other Clinician: Referring Provider: Treating Provider/Extender: Delano Metz in Treatment: 14 Information Obtained From Patient Constitutional Symptoms (General Health) Medical History: Past Medical History Notes: falls , leukocytosis Eyes Medical History: Negative for: Cataracts; Glaucoma; Optic Neuritis Ear/Nose/Mouth/Throat Medical History: Negative for: Chronic sinus problems/congestion; Middle ear problems Hematologic/Lymphatic Medical History: Positive for: Anemia Negative for: Hemophilia; Human Immunodeficiency Virus; Lymphedema; Sickle Cell Disease Respiratory Medical History: Negative for: Aspiration; Asthma; Chronic Obstructive Pulmonary Disease (COPD); Pneumothorax; Sleep Apnea; Tuberculosis Past Medical History Notes: During waking hours and catches himself not breathing, "gasp for air". Saw Dr Wynonia Lawman, he couldn't find a reason Cardiovascular Medical History: Positive for: Hypertension; Peripheral Venous Disease Negative for: Angina; Arrhythmia; Congestive Heart Failure; Coronary Artery Disease; Deep Vein Thrombosis; Hypotension; Myocardial Infarction; Peripheral Arterial Disease; Phlebitis; Vasculitis Gastrointestinal Medical History: Negative for: Cirrhosis ; Colitis; Crohns; Hepatitis A; Hepatitis B; Hepatitis C Endocrine Medical History: Positive for: Type II Diabetes - oral meds Negative for:  Type I Diabetes Time with diabetes: 2014 Treated with: Oral agents Blood sugar tested every day: Yes Tested : Blood sugar testing results: Breakfast: 91 Genitourinary Medical History: Negative for: End Stage Renal Disease Immunological Medical History: Negative for: Lupus Erythematosus; Raynauds; Scleroderma Integumentary (Skin) Medical History: Negative for: History of Burn Musculoskeletal Medical History: Negative for: Gout; Rheumatoid Arthritis; Osteoarthritis; Osteomyelitis Neurologic Medical History: Negative for: Dementia; Neuropathy; Quadriplegia; Paraplegia; Seizure Disorder Oncologic Medical History: Negative for: Received Chemotherapy; Received Radiation Psychiatric Medical History: Negative for: Anorexia/bulimia; Confinement Anxiety Immunizations Pneumococcal Vaccine: Received Pneumococcal Vaccination: No Implantable Devices No devices added Hospitalization / Surgery History Type of Hospitalization/Surgery fever 105, sepsis cellulitis right popliteal fossa 04/09/2021 Family and Social History Cancer: No; Diabetes: Yes - Maternal Grandparents,Mother; Heart Disease: Yes - Father; Hereditary Spherocytosis: No; Hypertension: Yes - Father; Kidney Disease: No; Lung Disease: No; Seizures: No; Stroke: No; Thyroid Problems: No; Tuberculosis: No; Current every day smoker; Marital Status - Single; Alcohol Use: Never; Drug Use: No History; Caffeine Use: Never; Financial Concerns: No; Food, Clothing or Shelter Needs: No; Support System Lacking: No; Transportation Concerns: No Electronic Signature(s) Signed: 10/14/2021 1:50:13 PM By: Kalman Shan DO Signed: 10/14/2021 5:20:05 PM By: Deon Pilling RN, BSN Entered By: Kalman Shan on 10/14/2021 13:47:11 -------------------------------------------------------------------------------- [facility] Details Patient Name: Date of Service: [name], [place]. 10/14/2021 Medical Record Number: [id number] Patient Account  Number: [id number] Date of Birth/Sex: Treating RN: 1970/12/25 (50 y.o. 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 Facility Procedures CPT4 Code: [id number] Description: D3220 AFFINITY, 1.5X 1.5 SQ CM Modifier: Quantity: 3 CPT4 Code: [id number] Description: 37628 - SKIN SUB GRAFT FACE/NK/HF/G ICD-10 Diagnosis Description L97.522 Non-pressure chronic ulcer of other part of left foot with fat layer exposed E11.621 Type 2 diabetes mellitus with foot ulcer Modifier: Quantity: 1 Physician Procedures : CPT4 Code Description Modifier [id number] -  WC PHYS SKIN SUB GRAFT FACE/NK/HF/G ICD-10 Diagnosis Description L97.522 Non-pressure chronic ulcer of other part of left foot with fat layer exposed E11.621 Type 2 diabetes mellitus with foot ulcer Quantity: 1 Electronic Signature(s) Signed: 10/14/2021 1:50:13 PM By: Kalman Shan DO Entered By: Kalman Shan on 10/14/2021 13:49:51

## 2021-10-17 ENCOUNTER — Encounter (HOSPITAL_COMMUNITY): Payer: Medicare Other

## 2021-10-21 ENCOUNTER — Encounter (HOSPITAL_BASED_OUTPATIENT_CLINIC_OR_DEPARTMENT_OTHER): Payer: Medicare Other | Admitting: Internal Medicine

## 2021-10-21 ENCOUNTER — Other Ambulatory Visit: Payer: Self-pay

## 2021-10-21 DIAGNOSIS — L97522 Non-pressure chronic ulcer of other part of left foot with fat layer exposed: Secondary | ICD-10-CM | POA: Diagnosis not present

## 2021-10-21 DIAGNOSIS — E11621 Type 2 diabetes mellitus with foot ulcer: Secondary | ICD-10-CM

## 2021-10-21 NOTE — Progress Notes (Signed)
[name] [name] ([id number]) Visit Report for 10/21/2021 Chief Complaint Document Details Patient Name: Date of Service: [name] [name] [name] 10/21/2021 9:00 A M Medical Record Number: [id number] Patient Account Number: [id number] Date of Birth/Sex: Treating RN: 1971/05/22 (50 y.o. [name] Primary Care Provider: [name] Other Clinician: Referring Provider: Treating Provider/Extender: Delano Metz in Treatment: 5 Information Obtained from: Patient Chief Complaint patient is here for a review of the wound on his plantar left first metatarsal head and back of his right leg Electronic Signature(s) Signed: 10/21/2021 10:01:14 AM By: Kalman Shan DO Entered By: Kalman Shan on 10/21/2021 09:56:47 -------------------------------------------------------------------------------- Cellular or Tissue Based Product Details Patient Name: Date of Service: [name] [name], [name] 10/21/2021 9:00 A M Medical Record Number: [id number] Patient Account Number: [id number] Date of Birth/Sex: Treating RN: [birth year] (50 y.o. [name] Primary Care Provider: [name] Other Clinician: Referring Provider: Treating Provider/Extender: Delano Metz in Treatment: 60 Cellular or Tissue Based Product Type Wound #2R Left Metatarsal head first Applied to: Performed By: Physician Kalman Shan, DO Cellular or Tissue Based Product Type: Affinity Level of Consciousness (Pre-procedure): Awake and Alert Pre-procedure Verification/Time Out Yes - 09:50 Taken: Location: genitalia / hands / feet / multiple digits Wound Size (sq cm): 0.36 Product Size (sq cm): 3 Waste Size (sq cm): 0 Amount of Product Applied (sq cm): 3 Instrument Used: Forceps Lot #: [id number] Order #: 3 Expiration Date: 10/22/2021 Fenestrated: No Reconstituted: No Secured: Yes Secured With: Steri-Strips Dressing Applied: Yes Primary  Dressing: adaptic, gauze Procedural Pain: 0 Post Procedural Pain: 5 Response to Treatment: Procedure was tolerated well Level of Consciousness (Post- Awake and Alert procedure): Post Procedure Diagnosis Same as Pre-procedure Electronic Signature(s) Signed: 10/21/2021 10:01:14 AM By: Kalman Shan DO Signed: 10/21/2021 5:53:22 PM By: Baruch Gouty RN, BSN Entered By: Baruch Gouty on 10/21/2021 09:55:20 -------------------------------------------------------------------------------- Debridement Details Patient Name: Date of Service: [name] [name], [name] 10/21/2021 9:00 A M Medical Record Number: [id number] Patient Account Number: [id number] Date of Birth/Sex: Treating RN: 1971-02-21 (50 y.o. [name] Primary Care Provider: [name] Other Clinician: Referring Provider: Treating Provider/Extender: Delano Metz in Treatment: 61 Debridement Performed for Assessment: Wound #2R Left Metatarsal head first Performed By: Physician Kalman Shan, DO Debridement Type: Debridement Severity of Tissue Pre Debridement: Fat layer exposed Level of Consciousness (Pre-procedure): Awake and Alert Pre-procedure Verification/Time Out Yes - 09:45 Taken: Start Time: 09:46 T Area Debrided (L x W): otal 0.9 (cm) x 0.4 (cm) = 0.36 (cm) Tissue and other material debrided: Viable, Non-Viable, Callus, Slough, Subcutaneous, Skin: Epidermis, Slough Level: Skin/Subcutaneous Tissue Debridement Description: Excisional Instrument: Curette Bleeding: Minimum Hemostasis Achieved: Pressure End Time: 09:51 Procedural Pain: 0 Post Procedural Pain: 5 Response to Treatment: Procedure was tolerated well Level of Consciousness (Post- Awake and Alert procedure): Post Debridement Measurements of Total Wound Length: (cm) 0.9 Width: (cm) 0.4 Depth: (cm) 0.5 Volume: (cm) 0.141 Character of Wound/Ulcer Post Debridement: Improved Severity of Tissue Post  Debridement: Fat layer exposed Post Procedure Diagnosis Same as Pre-procedure Electronic Signature(s) Signed: 10/21/2021 10:01:14 AM By: Kalman Shan DO Signed: 10/21/2021 5:53:22 PM By: Baruch Gouty RN, BSN Entered By: Baruch Gouty on 10/21/2021 09:50:12 -------------------------------------------------------------------------------- HPI Details Patient Name: Date of Service: [name] [name], [name] 10/21/2021 9:00 A M Medical Record Number: [id number] Patient Account Number: [id number] Date of Birth/Sex: Treating RN: [birth year] (50 y.o. [name] Primary Care Provider: [name] Other Clinician: Referring Provider: Treating Provider/Extender:  Kalman Shan [name] Weeks in Treatment: 61 History of Present Illness HPI Description: 04/21/18 ADMISSION This is a 50 year old man who is a type II diabetic. In spite of fact his hemoglobin A1c is actually quite good 5.73 months ago he is at a really difficult time over the last 6-7 months. He developed a rapidly progressive infection in the right foot in November 2018 associated with osteomyelitis and necrotizing fasciitis. 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 The stump required and a revision on 11/24/17. The stump revision was advertised as being secondary to falls. I'm not sure the progressive history here however this area is actually closed over. The patient tells me over the same timeframe he has had wounds on the plantar aspect of his left foot. In the ED saw Dr. Sharol Given in April of this year. 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 It is really not clear that this patient is been dressing this with anything. He came in with the clinic without any specific dressing on the wound areas using his own tennis shoe. He tells me that he only ambulates of course to do a pivot transfer. He does not have his prosthesis for the right leg as of yet and he blames Medicaid  for this. He does however use the foot to push himself along in his wheelchair at home. The patient has not had formal arterial studies. ABI in our clinic on the left was 1.13. Patient's past medical history includes type 2 diabetes, fracture of the right fibula. I see that he was treated for abscesses on his right buttock and chest in 2016. I did not look at the microbiology of this. 04/28/18; patient comes back in the clinic today with the wound pretty much the same as when he came in here last week. Small opening lots of undermining relatively. He tells [place] that nothing is really been on this for 3 days in spite of the fact that we gave him enough to dress this easily especially such a small wound. He says he lives with his mother, she is not capable of assisting with this he is changing the dressings himself. 05/05/18; much better-looking wound today. Smaller. There is some undermining medially however that's only perhaps 2 mm. No surrounding erythema. We've been using silver collagen 05/12/18; small wound on the first metatarsal head. No undermining no surrounding erythema. We've been using silver collagen he has home health coming out to change 05/20/18 the wound on the first metatarsal head looks better. Covered in surface debris/callus nonviable tissue. Required debridement but post debridement this looks quite good. We've been using silver collagen. He has home health 05/27/18; first metatarsal head wound continues to improve. Just about completely closed. Still a lot of surface debridement callus. We've been using silver collagen 06/06/18; the first metatarsal wound is completely healed over. There is still a lot of callus. I gently removed some of this just to make sure that there was no open area and there is not. The patient mentions to me that his left leg has felt like "lead" for about a week READMISSION 08/16/2020 This is a 50 year old man with type 2 diabetes. He returns to clinic today  with a 1 month history of a blister and callus over the first metatarsal head. This is in exactly the same area as when he was here in 2019. 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 He has standard running shoes on the left foot to match the area in his prosthesis. He has only been covering this area with a Band-Aid has not really been specifically dressing this. ABI in our clinic on the left was 1.16. This is essentially stable from the value in 2019 10/1 the patient has a linear wound over the left first metatarsal head. Again not a lot of callus thick skin around this that I removed with a #3 curette. This is not go deep to bone or muscle it does not look to be infected. 10/8 first metatarsal head. The wound looks like it is closing however this is going to be a difficult area to offload in the future. He has a size 15 shoe and he says his shoes are years old. The inserts in these current shoes are totally useless and I have advised him to replace these. He may need a new pair of shoes and I have he got original prosthesis at [facility] I have asked him to go back there. 11/9; the patient has not been here in more than a month. Not sure he was healed when he was here the last time. I told him he would have to get new shoes and certainly offload the area over the left first metatarsal head. He has done nothing of the kind. He arrives back in clinic with the same old new balance sneakers. A very large separating callus over the first metatarsal head on the left 11/16; he has purchased new shoes and has at least 2 insoles like I asked. The wound is measuring much smaller. He is using silver alginate he changes the dressing himself 11/30; wound is measurably smaller in length of about a half a centimeter. This is generally very little depth. We have been using silver alginate. He changes the dressing himself every second day. 12/14; wound is  about the same size. Significant undermining medially relative to the size of the wound. We have been using silver alginate. There is not a way to offload this area as he walks with a prosthesis on the right leg 12/28; wound is about the same size. Again he has undermining medially. I am using polymen on this 12/02/2020; the wound looks smaller. Still a senescent edge. We have been using polymen 1/24; the wound is come down a few millimeters. Still a senescent edge. I have been using polymen 2/8; the wound is come down slightly. Still with slight undermining from 12-6 o'clock I have been using polymen partially to get offloading on this area which is opposed by his prosthesis on the other leg. 2/22 quite open improvement he still has a comma shaped wound on the left first metatarsal head. Some depth. Using polymen 3/14; he still has the same problem of a comma shaped area over the left first metatarsal head. This seems to have had more depth today at 0.6 cm. We have been using polymen. The patient changes this himself every second day. I explored the idea of a total contact cast on the left leg however this is his driving foot. He was not enamored with the idea of not being able to drive and states that he does not have anybody else to get him around 3/28; again he comes in with a smaller looking wound however there is depth and undermining requiring debridement. We have been using Hydrofera Blue, changed to silver collagen today. The patient is doing the dressing himself 4/11;  patient presents today for follow-up of his left diabetic foot wound. He denies any issues in the past 2 weeks. He is currently using silver collagen every other day with dressing changes. He does not use diabetic shoes or special inserts to his sneakers. He does not use felt pads for offloading. 4/19; patient presents for his 1 week follow-up. He denies any issues. He reports using Hydrofera Blue and has not been using silver  collagen for dressing changes. He reports minimal drainage to the wound. He overall feels well. 5/3; patient presents for 2-week follow-up. He has been using silver collagen without issues. He has no complaints today and states he overall feels well. 5/17; patient presents for 2-week follow-up. He states that the sleeve is rubbing up against the back of his right leg and causing a wound. He states this happened a week ago and has not been dressing the wound. The plantar wound is stable and he has been using collagen every other day. 5/24; Patient was had a recent hospitalization for cellulitis of his right popliteal fossa. He was admitted and started on IV antibiotics. He does not recall the plantar wound dressed while inpatient. He feels a lot better today. He was discharged on 5/21 on oral antibiotics and has not picked this up until today. He reports using collagen to the plantar wound since discharge. He is keeping the right posterior knee wound covered. He is getting a new sleeve for his prostatic on 5/26. He currently denies signs of infection. 6/1; patient presents for 1 week follow-up. He is still on doxycycline. He has not been dressing the back of the right knee wound. He states he is receiving his new prosthetic sleeve tomorrow. He states he is using silver alginate to the plantar foot wound. He currently denies signs of infection. He states he has not been offloading the left foot wound 6/7; patient presents for 1 week follow-up. He completes his last dose of doxycycline today. He finally received his prosthetic sleeve for the right side and he states it feels much better. He has been using antibiotic ointment to the back of that right knee wound. He has been using silver alginate to the plantar foot wound. He currently denies signs of infection. 6/21; patient presents for 2-week follow-up. He reports no issues with his prosthetic sleeve. He has been using antibiotic ointment to the back  of the right knee. He has been using silver alginate to the plantar foot wound. He has no issues or complaints today. He denies signs of infection. 7/5; patient presents for 2-week follow-up. He reports no issues or complaints today. He continues to use antibiotic ointment to the right posterior knee wound and silver alginate to the left plantar foot wound. He denies signs of infection. 7/19; patient presents for 2-week follow-up. He continues to use antibiotic ointment to the right posterior knee and silver alginate to the left plantar foot wound. He has no issues or complaints today. He denies signs of infection. 8/4; patient presents for 2-week follow-up. He has no issues or complaints today. He denies signs of infection. 8/18; patient presents for 2-week follow-up. He has no issues or concerns today. He has been approved for Apligraf and he would like to have this placed today. He denies signs of infection and reports that his right posterior knee wound is closed. 8/25; patient presents for 1 week follow-up. He had no issues with his first Apligraf placement last week. He did however develop an abscess to the  back of his right knee. He reports tenderness to this area. He denies systemic signs of infection. 9/1; patient presents for 1 week follow-up. He has had no issues with his plantar foot wound. The Apligraf has stayed in place over the past week. A wound culture was done at last clinic visit and a new antibiotic was sent to the pharmacy. Patient has not received this yet. He reports some mild tenderness to the back of his right knee. He denies systemic signs of infection. 9/8; patient presents for 1 week follow-up. He states he took Keflex and had diarrhea as a side effect. He was able to complete the course. He reports continued tenderness to the right posterior knee wound. He has some drainage still. He denies increased warmth or erythema to the area. He denies fever/chills or  nausea/vomiting. He has no issues or complaints today about his plantar foot wound. 9/15; patient presents for follow-up. He reports improvement in pain to the back of his right leg. He does not report any drainage. He had an ultrasound completed on 9/9 that showed a superficial fluid collection concerning for abscess. We attempted to call patient with results with no answer. We did put an urgent referral to general surgery and he reports he has not received a call. He currently denies systemic signs of infection. He reports no issues to his left plantar foot wound. 9/27; patient presents for follow-up. He has no issues or complaints today. He has had no issues with the previous wound to the back of his right leg. He has not made an appointment to see general surgery. He had Apligraf placed at last clinic visit to the plantar foot wound. He currently denies signs of infection. 10/7; patient presents for follow-up. Upon entering the room there is vomit throughout the entire floor. Patient states he does not feel well. I recommended he go to urgent care. I recommended following up next week for wound care. 10/10; patient presents for follow-up. He came last week for his follow-up however he was ill and started vomiting in the exam room. Today he states he feels better however he continues to have vomiting episodes. He states he attempted to go to urgent care/the ED but could not find a parking spot. He subsequently went home. He has no issues or complaints today regarding the foot wound. He never followed up with general surgery for the abscess identified on ultrasound to the back of his right knee. 10/20; patient presents for follow-up. He has no issues or complaints today. Overall he feels well today. 10/27; patient presents for follow-up. He has no issues or complaints today. 11/3; patient presents for follow-up. He has no issues or complaints today. He states he does not want to follow-up with  orthopedics to reevaluate the abscess in the back of his right leg. He denies signs of infection. 11/15; patient presents for follow-up. He has no issues or complaints today. He had been using silver alginate to the left first met head wound. He denies signs of infection. 11/22; patient presents for follow-up. He had Affinity placed at last clinic visit. He has no issues or complaints today. 11/29; patient presents for follow-up. He is in good spirits today. He has no issues or complaints today. Electronic Signature(s) Signed: 10/21/2021 10:01:14 AM By: Kalman Shan DO Entered By: Kalman Shan on 10/21/2021 09:57:13 -------------------------------------------------------------------------------- Physical Exam Details Patient Name: Date of Service: [name] [name], [place] 10/21/2021 9:00 A M Medical Record Number: [id number] Patient Account Number:  [id number] Date of Birth/Sex: Treating RN: [birth year] (50 y.o. [name] Primary Care Provider: [name] Other Clinician: Referring Provider: Treating Provider/Extender: Delano Metz in Treatment: 4 Constitutional respirations regular, non-labored and within target range for patient.. Cardiovascular 2+ dorsalis pedis/posterior tibialis pulses. Psychiatric pleasant and cooperative. Notes Left leg: Open wound with granulation tissue and nonviable tissue with circumferential callus. No obvious signs of infection. Electronic Signature(s) Signed: 10/21/2021 10:01:14 AM By: Kalman Shan DO Entered By: Kalman Shan on 10/21/2021 09:58:01 -------------------------------------------------------------------------------- Physician Orders Details Patient Name: Date of Service: [name] [name], [name] 10/21/2021 9:00 A M Medical Record Number: [id number] Patient Account Number: [id number] Date of Birth/Sex: Treating RN: 1971/04/05 (50 y.o. [name] Primary Care Provider: [name]  Other Clinician: Referring Provider: Treating Provider/Extender: Delano Metz in Treatment: 46 Verbal / Phone Orders: No Diagnosis Coding ICD-10 Coding Code Description [phone] Non-pressure chronic ulcer of other part of left foot with fat layer exposed E11.621 Type 2 diabetes mellitus with foot ulcer E11.42 Type 2 diabetes mellitus with diabetic polyneuropathy Z89.511 Acquired absence of right leg below knee Follow-up Appointments ppointment in 1 week. - with Dr. Heber [place] - Tuesday Return A Cellular or Tissue Based Products Cellular or Tissue Based Product Type: - Affinity #3 Cellular or Tissue Based Product applied to wound bed, secured with steri-strips, cover with Adaptic or Mepitel. (DO NOT REMOVE). Edema Control - Lymphedema / SCD / Other Elevate legs to the level of the heart or above for 30 minutes daily and/or when sitting, a frequency of: Avoid standing for long periods of time. Wound Treatment Wound #2R - Metatarsal head first Wound Laterality: Left Cleanser: Soap and Water 1 x Per Week/15 Days Discharge Instructions: May shower and wash wound with dial antibacterial soap and water prior to dressing change. Cleanser: Wound Cleanser (Generic) 1 x Per Week/15 Days Discharge Instructions: Cleanse the wound with wound cleanser prior to applying a clean dressing using gauze sponges, not tissue or cotton balls. Peri-Wound Care: Sween Lotion (Moisturizing lotion) 1 x Per Week/15 Days Discharge Instructions: Apply moisturizing lotion to leg Prim Dressing: Affinity ary 1 x Per Week/15 Days Secondary Dressing: Woven Gauze Sponges 2x2 in (Generic) 1 x Per Week/15 Days Discharge Instructions: Apply over primary dressing as directed. Secondary Dressing: Optifoam Non-Adhesive Dressing, 4x4 in (Generic) 1 x Per Week/15 Days Discharge Instructions: cut to make foam donut to help offload Secondary Dressing: ADAPTIC TOUCH 3x4.25 in 1 x Per Week/15 Days Discharge  Instructions: Apply over primary dressing as directed. Secured With: Child psychotherapist, Sterile 2x75 (in/in) (Generic) 1 x Per Week/15 Days Discharge Instructions: Secure with stretch gauze as directed. Secured With: 35M Medipore H Soft Cloth Surgical Tape, 2x2 (in/yd) (Generic) 1 x Per Week/15 Days Discharge Instructions: Secure dressing with tape as directed. Electronic Signature(s) Signed: 10/21/2021 10:01:14 AM By: Kalman Shan DO Entered By: Kalman Shan on 10/21/2021 09:58:26 -------------------------------------------------------------------------------- Problem List Details Patient Name: Date of Service: [name] [name], [name] 10/21/2021 9:00 A M Medical Record Number: [id number] Patient Account Number: [id number] Date of Birth/Sex: Treating RN: 1971-05-23 (50 y.o. [name] Primary Care Provider: [name] Other Clinician: Referring Provider: Treating Provider/Extender: Delano Metz in Treatment: 73 Active Problems ICD-10 Encounter Code Description Active Date MDM Diagnosis [phone] Non-pressure chronic ulcer of other part of left foot with fat layer exposed 08/16/2020 No Yes E11.621 Type 2 diabetes mellitus with foot ulcer 08/16/2020 No Yes E11.42 Type 2 diabetes mellitus with  diabetic polyneuropathy 08/16/2020 No Yes Z89.511 Acquired absence of right leg below knee 08/16/2020 No Yes Inactive Problems Resolved Problems ICD-10 Code Description Active Date Resolved Date S81.801D Unspecified open wound, right lower leg, subsequent encounter 04/08/2021 04/08/2021 Electronic Signature(s) Signed: 10/21/2021 10:01:14 AM By: Kalman Shan DO Entered By: Kalman Shan on 10/21/2021 09:56:13 -------------------------------------------------------------------------------- Progress Note Details Patient Name: Date of Service: [name] [name], [name] [name] 10/21/2021 9:00 A M Medical Record Number: [id number] Patient Account  Number: [id number] Date of Birth/Sex: Treating RN: 1971/08/25 (50 y.o. [name] Primary Care Provider: [name] Other Clinician: Referring Provider: Treating Provider/Extender: Delano Metz in Treatment: 39 Subjective Chief Complaint Information obtained from Patient patient is here for a review of the wound on his plantar left first metatarsal head and back of his right leg History of Present Illness (HPI) 04/21/18 ADMISSION This is a 50 year old man who is a type II diabetic. In spite of fact his hemoglobin A1c is actually quite good 5.73 months ago he is at a really difficult time over the last 6-7 months. He developed a rapidly progressive infection in the right foot in November 2018 associated with osteomyelitis and necrotizing fasciitis. 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 The stump required and a revision on 11/24/17. The stump revision was advertised as being secondary to falls. I'm not sure the progressive history here however this area is actually closed over. The patient tells me over the same timeframe he has had wounds on the plantar aspect of his left foot. In the ED saw Dr. Sharol Given in April of this year. 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 It is really not clear that this patient is been dressing this with anything. He came in with the clinic without any specific dressing on the wound areas using his own tennis shoe. He tells me that he only ambulates of course to do a pivot transfer. He does not have his prosthesis for the right leg as of yet and he blames Medicaid for this. He does however use the foot to push himself along in his wheelchair at home. The patient has not had formal arterial studies. ABI in our clinic on the left was 1.13. Patient's past medical history includes type 2 diabetes, fracture of the right fibula. I see that he was treated for abscesses on his right buttock and  chest in 2016. I did not look at the microbiology of this. 04/28/18; patient comes back in the clinic today with the wound pretty much the same as when he came in here last week. Small opening lots of undermining relatively. He tells [place] that nothing is really been on this for 3 days in spite of the fact that we gave him enough to dress this easily especially such a small wound. He says he lives with his mother, she is not capable of assisting with this he is changing the dressings himself. 05/05/18; much better-looking wound today. Smaller. There is some undermining medially however that's only perhaps 2 mm. No surrounding erythema. We've been using silver collagen 05/12/18; small wound on the first metatarsal head. No undermining no surrounding erythema. We've been using silver collagen he has home health coming out to change 05/20/18 the wound on the first metatarsal head looks better. Covered in surface debris/callus nonviable tissue. Required debridement but post debridement this looks quite good. We've been using silver collagen. He has home health 05/27/18; first metatarsal head wound  continues to improve. Just about completely closed. Still a lot of surface debridement callus. We've been using silver collagen 06/06/18; the first metatarsal wound is completely healed over. There is still a lot of callus. I gently removed some of this just to make sure that there was no open area and there is not. The patient mentions to me that his left leg has felt like "lead" for about a week READMISSION 08/16/2020 This is a 50 year old man with type 2 diabetes. He returns to clinic today with a 1 month history of a blister and callus over the first metatarsal head. This is in exactly the same area as when he was here in 2019. 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 He has standard running shoes on the left foot to match the area in his prosthesis. He has only been  covering this area with a Band-Aid has not really been specifically dressing this. ABI in our clinic on the left was 1.16. This is essentially stable from the value in 2019 10/1 the patient has a linear wound over the left first metatarsal head. Again not a lot of callus thick skin around this that I removed with a #3 curette. This is not go deep to bone or muscle it does not look to be infected. 10/8 first metatarsal head. The wound looks like it is closing however this is going to be a difficult area to offload in the future. He has a size 15 shoe and he says his shoes are years old. The inserts in these current shoes are totally useless and I have advised him to replace these. He may need a new pair of shoes and I have he got original prosthesis at [facility] I have asked him to go back there. 11/9; the patient has not been here in more than a month. Not sure he was healed when he was here the last time. I told him he would have to get new shoes and certainly offload the area over the left first metatarsal head. He has done nothing of the kind. He arrives back in clinic with the same old new balance sneakers. A very large separating callus over the first metatarsal head on the left 11/16; he has purchased new shoes and has at least 2 insoles like I asked. The wound is measuring much smaller. He is using silver alginate he changes the dressing himself 11/30; wound is measurably smaller in length of about a half a centimeter. This is generally very little depth. We have been using silver alginate. He changes the dressing himself every second day. 12/14; wound is about the same size. Significant undermining medially relative to the size of the wound. We have been using silver alginate. There is not a way to offload this area as he walks with a prosthesis on the right leg 12/28; wound is about the same size. Again he has undermining medially. I am using polymen on this 12/02/2020; the wound  looks smaller. Still a senescent edge. We have been using polymen 1/24; the wound is come down a few millimeters. Still a senescent edge. I have been using polymen 2/8; the wound is come down slightly. Still with slight undermining from 12-6 o'clock I have been using polymen partially to get offloading on this area which is opposed by his prosthesis on the other leg. 2/22 quite open improvement he still has a comma shaped wound on the  left first metatarsal head. Some depth. Using polymen 3/14; he still has the same problem of a comma shaped area over the left first metatarsal head. This seems to have had more depth today at 0.6 cm. We have been using polymen. The patient changes this himself every second day. I explored the idea of a total contact cast on the left leg however this is his driving foot. He was not enamored with the idea of not being able to drive and states that he does not have anybody else to get him around 3/28; again he comes in with a smaller looking wound however there is depth and undermining requiring debridement. We have been using Hydrofera Blue, changed to silver collagen today. The patient is doing the dressing himself 4/11; patient presents today for follow-up of his left diabetic foot wound. He denies any issues in the past 2 weeks. He is currently using silver collagen every other day with dressing changes. He does not use diabetic shoes or special inserts to his sneakers. He does not use felt pads for offloading. 4/19; patient presents for his 1 week follow-up. He denies any issues. He reports using Hydrofera Blue and has not been using silver collagen for dressing changes. He reports minimal drainage to the wound. He overall feels well. 5/3; patient presents for 2-week follow-up. He has been using silver collagen without issues. He has no complaints today and states he overall feels well. 5/17; patient presents for 2-week follow-up. He states that the sleeve is rubbing  up against the back of his right leg and causing a wound. He states this happened a week ago and has not been dressing the wound. The plantar wound is stable and he has been using collagen every other day. 5/24; Patient was had a recent hospitalization for cellulitis of his right popliteal fossa. He was admitted and started on IV antibiotics. He does not recall the plantar wound dressed while inpatient. He feels a lot better today. He was discharged on 5/21 on oral antibiotics and has not picked this up until today. He reports using collagen to the plantar wound since discharge. He is keeping the right posterior knee wound covered. He is getting a new sleeve for his prostatic on 5/26. He currently denies signs of infection. 6/1; patient presents for 1 week follow-up. He is still on doxycycline. He has not been dressing the back of the right knee wound. He states he is receiving his new prosthetic sleeve tomorrow. He states he is using silver alginate to the plantar foot wound. He currently denies signs of infection. He states he has not been offloading the left foot wound 6/7; patient presents for 1 week follow-up. He completes his last dose of doxycycline today. He finally received his prosthetic sleeve for the right side and he states it feels much better. He has been using antibiotic ointment to the back of that right knee wound. He has been using silver alginate to the plantar foot wound. He currently denies signs of infection. 6/21; patient presents for 2-week follow-up. He reports no issues with his prosthetic sleeve. He has been using antibiotic ointment to the back of the right knee. He has been using silver alginate to the plantar foot wound. He has no issues or complaints today. He denies signs of infection. 7/5; patient presents for 2-week follow-up. He reports no issues or complaints today. He continues to use antibiotic ointment to the right posterior knee wound and silver alginate to the  left plantar  foot wound. He denies signs of infection. 7/19; patient presents for 2-week follow-up. He continues to use antibiotic ointment to the right posterior knee and silver alginate to the left plantar foot wound. He has no issues or complaints today. He denies signs of infection. 8/4; patient presents for 2-week follow-up. He has no issues or complaints today. He denies signs of infection. 8/18; patient presents for 2-week follow-up. He has no issues or concerns today. He has been approved for Apligraf and he would like to have this placed today. He denies signs of infection and reports that his right posterior knee wound is closed. 8/25; patient presents for 1 week follow-up. He had no issues with his first Apligraf placement last week. He did however develop an abscess to the back of his right knee. He reports tenderness to this area. He denies systemic signs of infection. 9/1; patient presents for 1 week follow-up. He has had no issues with his plantar foot wound. The Apligraf has stayed in place over the past week. A wound culture was done at last clinic visit and a new antibiotic was sent to the pharmacy. Patient has not received this yet. He reports some mild tenderness to the back of his right knee. He denies systemic signs of infection. 9/8; patient presents for 1 week follow-up. He states he took Keflex and had diarrhea as a side effect. He was able to complete the course. He reports continued tenderness to the right posterior knee wound. He has some drainage still. He denies increased warmth or erythema to the area. He denies fever/chills or nausea/vomiting. He has no issues or complaints today about his plantar foot wound. 9/15; patient presents for follow-up. He reports improvement in pain to the back of his right leg. He does not report any drainage. He had an ultrasound completed on 9/9 that showed a superficial fluid collection concerning for abscess. We attempted to call patient  with results with no answer. We did put an urgent referral to general surgery and he reports he has not received a call. He currently denies systemic signs of infection. He reports no issues to his left plantar foot wound. 9/27; patient presents for follow-up. He has no issues or complaints today. He has had no issues with the previous wound to the back of his right leg. He has not made an appointment to see general surgery. He had Apligraf placed at last clinic visit to the plantar foot wound. He currently denies signs of infection. 10/7; patient presents for follow-up. Upon entering the room there is vomit throughout the entire floor. Patient states he does not feel well. I recommended he go to urgent care. I recommended following up next week for wound care. 10/10; patient presents for follow-up. He came last week for his follow-up however he was ill and started vomiting in the exam room. Today he states he feels better however he continues to have vomiting episodes. He states he attempted to go to urgent care/the ED but could not find a parking spot. He subsequently went home. He has no issues or complaints today regarding the foot wound. He never followed up with general surgery for the abscess identified on ultrasound to the back of his right knee. 10/20; patient presents for follow-up. He has no issues or complaints today. Overall he feels well today. 10/27; patient presents for follow-up. He has no issues or complaints today. 11/3; patient presents for follow-up. He has no issues or complaints today. He states he does not  want to follow-up with orthopedics to reevaluate the abscess in the back of his right leg. He denies signs of infection. 11/15; patient presents for follow-up. He has no issues or complaints today. He had been using silver alginate to the left first met head wound. He denies signs of infection. 11/22; patient presents for follow-up. He had Affinity placed at last clinic  visit. He has no issues or complaints today. 11/29; patient presents for follow-up. He is in good spirits today. He has no issues or complaints today. Patient History Information obtained from Patient. Family History Diabetes - Maternal Grandparents,Mother, Heart Disease - Father, Hypertension - Father, No family history of Cancer, Hereditary Spherocytosis, Kidney Disease, Lung Disease, Seizures, Stroke, Thyroid Problems, Tuberculosis. Social History Current every day smoker, Marital Status - Single, Alcohol Use - Never, Drug Use - No History, Caffeine Use - Never. Medical History Eyes Denies history of Cataracts, Glaucoma, Optic Neuritis Ear/Nose/Mouth/Throat Denies history of Chronic sinus problems/congestion, Middle ear problems Hematologic/Lymphatic Patient has history of Anemia Denies history of Hemophilia, Human Immunodeficiency Virus, Lymphedema, Sickle Cell Disease Respiratory Denies history of Aspiration, Asthma, Chronic Obstructive Pulmonary Disease (COPD), Pneumothorax, Sleep Apnea, Tuberculosis Cardiovascular Patient has history of Hypertension, Peripheral Venous Disease Denies history of Angina, Arrhythmia, Congestive Heart Failure, Coronary Artery Disease, Deep Vein Thrombosis, Hypotension, Myocardial Infarction, Peripheral Arterial Disease, Phlebitis, Vasculitis Gastrointestinal Denies history of Cirrhosis , Colitis, Crohnoos, Hepatitis A, Hepatitis B, Hepatitis C Endocrine Patient has history of Type II Diabetes - oral meds Denies history of Type I Diabetes Genitourinary Denies history of End Stage Renal Disease Immunological Denies history of Lupus Erythematosus, Raynaudoos, Scleroderma Integumentary (Skin) Denies history of History of Burn Musculoskeletal Denies history of Gout, Rheumatoid Arthritis, Osteoarthritis, Osteomyelitis Neurologic Denies history of Dementia, Neuropathy, Quadriplegia, Paraplegia, Seizure Disorder Oncologic Denies history of Received  Chemotherapy, Received Radiation Psychiatric Denies history of Anorexia/bulimia, Confinement Anxiety Hospitalization/Surgery History - fever 105, sepsis cellulitis right popliteal fossa 04/09/2021. Medical A Surgical History Notes [name] Constitutional Symptoms (General Health) falls , leukocytosis Respiratory During waking hours and catches himself not breathing, "gasp for air". Saw Dr Wynonia Lawman, he couldn't find a reason Objective Constitutional respirations regular, non-labored and within target range for patient.. Vitals Time Taken: 9:08 AM, Height: 77 in, Weight: 232 lbs, BMI: 27.5, Temperature: 98 F, Pulse: 87 bpm, Respiratory Rate: 18 breaths/min, Blood Pressure: 153/95 mmHg. Cardiovascular 2+ dorsalis pedis/posterior tibialis pulses. Psychiatric pleasant and cooperative. General Notes: Left leg: Open wound with granulation tissue and nonviable tissue with circumferential callus. No obvious signs of infection. Integumentary (Hair, Skin) Wound #2R status is Open. Original cause of wound was Blister. The date acquired was: 06/23/2020. The wound has been in treatment 61 weeks. The wound is located on the Left Metatarsal head first. The wound measures 0.9cm length x 0.4cm width x 0.5cm depth; 0.283cm^2 area and 0.141cm^3 volume. There is Fat Layer (Subcutaneous Tissue) exposed. There is no tunneling or undermining noted. There is a medium amount of serosanguineous drainage noted. The wound margin is well defined and not attached to the wound base. There is large (67-100%) red granulation within the wound bed. There is no necrotic tissue within the wound bed. 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 I debrided nonviable tissue. Affinity #3  was placed in standard fashion. Procedures Wound #2R Pre-procedure diagnosis of Wound #2R is a Diabetic Wound/Ulcer of the Lower Extremity located on the Left Metatarsal head first .Severity of Tissue Pre Debridement is: Fat layer exposed. There was a Excisional Skin/Subcutaneous Tissue Debridement with a total area of 0.36 sq cm performed by Kalman Shan, DO. With the following instrument(s): Curette to remove Viable and Non-Viable tissue/material. Material removed includes Callus, Subcutaneous Tissue, Slough, and Skin: Epidermis. No specimens were taken. A time out was conducted at 09:45, prior to the start of the procedure. A Minimum amount of bleeding was controlled with Pressure. The procedure was tolerated well with a pain level of 0 throughout and a pain level of 5 following the procedure. Post Debridement Measurements: 0.9cm length x 0.4cm width x 0.5cm depth; 0.141cm^3 volume. Character of Wound/Ulcer Post Debridement is improved. Severity of Tissue Post Debridement is: Fat layer exposed. Post procedure Diagnosis Wound #2R: Same as Pre-Procedure Pre-procedure diagnosis of Wound #2R is a Diabetic Wound/Ulcer of the Lower Extremity located on the Left Metatarsal head first. A skin graft procedure using a bioengineered skin substitute/cellular or tissue based product was performed by Kalman Shan, DO with the following instrument(s): Forceps. Affinity was applied and secured with Steri-Strips. 3 sq cm of product was utilized and 0 sq cm was wasted. Post Application, adaptic, gauze was applied. A Time Out was conducted at 09:50, prior to the start of the procedure. The procedure was tolerated well with a pain level of 0 throughout and a pain level of 5 following the procedure. Post procedure Diagnosis Wound #2R: Same as Pre-Procedure . Plan Follow-up Appointments: Return Appointment in 1 week. - with Dr. Heber Maltby - Tuesday Cellular or  Tissue Based Products: Cellular or Tissue Based Product Type: - Affinity #3 Cellular or Tissue Based Product applied to wound bed, secured with steri-strips, cover with Adaptic or Mepitel. (DO NOT REMOVE). Edema Control - Lymphedema / SCD / Other: Elevate legs to the level of the heart or above for 30 minutes daily and/or when sitting, a frequency of: Avoid standing for long periods of time. WOUND #2R: - Metatarsal head first Wound Laterality: Left Cleanser: Soap and Water 1 x Per Week/15 Days Discharge Instructions: May shower and wash wound with dial antibacterial soap and water prior to dressing change. Cleanser: Wound Cleanser (Generic) 1 x Per Week/15 Days Discharge Instructions: Cleanse the wound with wound cleanser prior to applying a clean dressing using gauze sponges, not tissue or cotton balls. Peri-Wound Care: Sween Lotion (Moisturizing lotion) 1 x Per Week/15 Days Discharge Instructions: Apply moisturizing lotion to leg Prim Dressing: Affinity 1 x Per Week/15 Days ary Secondary Dressing: Woven Gauze Sponges 2x2 in (Generic) 1 x Per Week/15 Days Discharge Instructions: Apply over primary dressing as directed. Secondary Dressing: Optifoam Non-Adhesive Dressing, 4x4 in (Generic) 1 x Per Week/15 Days Discharge Instructions: cut to make foam donut to help offload Secondary Dressing: ADAPTIC TOUCH 3x4.25 in 1 x Per Week/15 Days Discharge Instructions: Apply over primary dressing as directed. Secured With: Child psychotherapist, Sterile 2x75 (in/in) (Generic) 1 x Per Week/15 Days Discharge Instructions: Secure with stretch gauze as directed. Secured With: 29M Medipore H Soft Cloth Surgical T ape, 2x2 (in/yd) (Generic) 1 x Per Week/15 Days Discharge Instructions: Secure dressing with tape as directed. 1. Affinity #3 placed in standard fashion 2. Aggressive offloading 3. In office sharp debridement 4. Follow-up in 1 week Electronic Signature(s) Signed: 10/21/2021 10:01:14  AM By: Kalman Shan DO Entered By: Heber Altona,  Isair Inabinet on 10/21/2021 10:00:02 -------------------------------------------------------------------------------- HxROS Details Patient Name: Date of Service: [name] [name] [name] 10/21/2021 9:00 A M Medical Record Number: [id number] Patient Account Number: [id number] Date of Birth/Sex: Treating RN: 03-Jul-1971 (50 y.o. [name] Primary Care Provider: [name] Other Clinician: Referring Provider: Treating Provider/Extender: Delano Metz in Treatment: 40 Information Obtained From Patient Constitutional Symptoms (General Health) Medical History: Past Medical History Notes: falls , leukocytosis Eyes Medical History: Negative for: Cataracts; Glaucoma; Optic Neuritis Ear/Nose/Mouth/Throat Medical History: Negative for: Chronic sinus problems/congestion; Middle ear problems Hematologic/Lymphatic Medical History: Positive for: Anemia Negative for: Hemophilia; Human Immunodeficiency Virus; Lymphedema; Sickle Cell Disease Respiratory Medical History: Negative for: Aspiration; Asthma; Chronic Obstructive Pulmonary Disease (COPD); Pneumothorax; Sleep Apnea; Tuberculosis Past Medical History Notes: During waking hours and catches himself not breathing, "gasp for air". Saw Dr Wynonia Lawman, he couldn't find a reason Cardiovascular Medical History: Positive for: Hypertension; Peripheral Venous Disease Negative for: Angina; Arrhythmia; Congestive Heart Failure; Coronary Artery Disease; Deep Vein Thrombosis; Hypotension; Myocardial Infarction; Peripheral Arterial Disease; Phlebitis; Vasculitis Gastrointestinal Medical History: Negative for: Cirrhosis ; Colitis; Crohns; Hepatitis A; Hepatitis B; Hepatitis C Endocrine Medical History: Positive for: Type II Diabetes - oral meds Negative for: Type I Diabetes Time with diabetes: 2014 Treated with: Oral agents Blood sugar tested every day: Yes Tested : Blood  sugar testing results: Breakfast: 91 Genitourinary Medical History: Negative for: End Stage Renal Disease Immunological Medical History: Negative for: Lupus Erythematosus; Raynauds; Scleroderma Integumentary (Skin) Medical History: Negative for: History of Burn Musculoskeletal Medical History: Negative for: Gout; Rheumatoid Arthritis; Osteoarthritis; Osteomyelitis Neurologic Medical History: Negative for: Dementia; Neuropathy; Quadriplegia; Paraplegia; Seizure Disorder Oncologic Medical History: Negative for: Received Chemotherapy; Received Radiation Psychiatric Medical History: Negative for: Anorexia/bulimia; Confinement Anxiety Immunizations Pneumococcal Vaccine: Received Pneumococcal Vaccination: No Implantable Devices No devices added Hospitalization / Surgery History Type of Hospitalization/Surgery fever 105, sepsis cellulitis right popliteal fossa 04/09/2021 Family and Social History Cancer: No; Diabetes: Yes - Maternal Grandparents,Mother; Heart Disease: Yes - Father; Hereditary Spherocytosis: No; Hypertension: Yes - Father; Kidney Disease: No; Lung Disease: No; Seizures: No; Stroke: No; Thyroid Problems: No; Tuberculosis: No; Current every day smoker; Marital Status - Single; Alcohol Use: Never; Drug Use: No History; Caffeine Use: Never; Financial Concerns: No; Food, Clothing or Shelter Needs: No; Support System Lacking: No; Transportation Concerns: No Electronic Signature(s) Signed: 10/21/2021 10:01:14 AM By: Kalman Shan DO Signed: 10/21/2021 5:44:57 PM By: Deon Pilling RN, BSN Entered By: Kalman Shan on 10/21/2021 09:57:21 -------------------------------------------------------------------------------- [place] Details Patient Name: Date of Service: [name] [name], [name] 10/21/2021 Medical Record Number: [id number] Patient Account Number: [id number] Date of Birth/Sex: Treating RN: 01/14/1971 (50 y.o. 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

## 2021-10-21 NOTE — Progress Notes (Signed)
[name], [name] ([id number]) Visit Report for 10/21/2021 Arrival Information Details Patient Name: Date of Service: [name] [name] [name] L. 10/21/2021 9:00 A M Medical Record Number: [id number] Patient Account Number: [id number] Date of Birth/Sex: Treating RN: 30-Nov-1970 (50 y.o. [name] [name], [name] Primary Care Thuy Atilano: [name] Other Clinician: Referring Nissan Frazzini: Treating [name]/Extender: Delano Metz in Treatment: 72 Visit Information History Since Last Visit Added or deleted any medications: No Patient Arrived: Ambulatory Any new allergies or adverse reactions: No Arrival Time: 09:08 Had a fall or experienced change in No Accompanied By: self activities of daily living that may affect Transfer Assistance: None risk of falls: Patient Requires Transmission-Based Precautions: No Signs or symptoms of abuse/neglect since last visito No Patient Has Alerts: No Hospitalized since last visit: No Implantable device outside of the clinic excluding No cellular tissue based products placed in the center since last visit: Pain Present Now: Yes Electronic Signature(s) Signed: 10/21/2021 5:02:58 PM By: Dellie Catholic RN Entered By: Dellie Catholic on 10/21/2021 09:09:00 -------------------------------------------------------------------------------- Encounter Discharge Information Details Patient Name: Date of Service: [name] [name] ND, JA [name] L. 10/21/2021 9:00 A M Medical Record Number: [id number] Patient Account Number: [id number] Date of Birth/Sex: Treating RN: 1971/05/23 (50 y.o. [name] [name] Primary Care [name]: [name] Other Clinician: Referring [name]: Treating Shebra Muldrow/Extender: Delano Metz in Treatment: 44 Encounter Discharge Information Items Post Procedure Vitals Discharge Condition: Stable Temperature (F): 98 Ambulatory Status: Ambulatory Pulse (bpm): 87 Discharge Destination:  Home Respiratory Rate (breaths/min): 18 Transportation: Private Auto Blood Pressure (mmHg): 153/95 Accompanied By: self Schedule Follow-up Appointment: Yes Clinical Summary of Care: Patient Declined Electronic Signature(s) Signed: 10/21/2021 5:53:22 PM By: Baruch Gouty RN, BSN Entered By: Baruch Gouty on 10/21/2021 10:09:41 -------------------------------------------------------------------------------- Lower Extremity Assessment Details Patient Name: Date of Service: [name] ND, JA [name] L. 10/21/2021 9:00 A M Medical Record Number: [id number] Patient Account Number: [id number] Date of Birth/Sex: Treating RN: [birth year] (50 y.o. [name] [name] Primary Care [name]: [name] Other Clinician: Referring [name]: Treating [name]/Extender: Delano Metz in Treatment: 45 Edema Assessment Assessed: [name] [name]: No] [name] Paradise: No] Edema: [Left: N] [Right: o] Calf Left: Right: Point of Measurement: 50 cm From Medial Instep 39.8 cm Ankle Left: Right: Point of Measurement: 11 cm From Medial Instep 21.2 cm Electronic Signature(s) Signed: 10/21/2021 5:02:58 PM By: Dellie Catholic RN Signed: 10/21/2021 5:44:57 PM By: Deon Pilling RN, BSN Entered By: Dellie Catholic on 10/21/2021 09:10:45 -------------------------------------------------------------------------------- Multi Wound Chart Details Patient Name: Date of Service: [name] [name] ND, JA [name] L. 10/21/2021 9:00 A M Medical Record Number: [id number] Patient Account Number: [id number] Date of Birth/Sex: Treating RN: 1971-07-10 (50 y.o. [name] [name] Primary Care [name]: [name] Other Clinician: Referring [name]: Treating [name]/Extender: Delano Metz in Treatment: 21 Vital Signs Height(in): 77 Pulse(bpm): 87 Weight(lbs): 232 Blood Pressure(mmHg): 153/95 Body Mass Index(BMI): 28 Temperature(F): 98 Respiratory Rate(breaths/min): 18 Photos:  [N/A:N/A] Left Metatarsal head first N/A N/A Wound Location: Blister N/A N/A Wounding Event: Diabetic Wound/Ulcer of the Lower N/A N/A Primary Etiology: Extremity Anemia, Hypertension, Peripheral N/A N/A Comorbid History: Venous Disease, Type II Diabetes 06/23/2020 N/A N/A Date Acquired: 8 N/A N/A Weeks of Treatment: Open N/A N/A Wound Status: Yes N/A N/A Wound Recurrence: 0.9x0.4x0.5 N/A N/A Measurements L x W x D (cm) 0.283 N/A N/A A (cm) : rea 0.141 N/A N/A Volume (cm) : -812.90% N/A N/A % Reduction in A rea: -4600.00% N/A N/A % Reduction in Volume: Grade 2 N/A N/A Classification: Medium N/A N/A  Exudate A mount: Serosanguineous N/A N/A Exudate Type: red, brown N/A N/A Exudate Color: Well defined, not attached N/A N/A Wound Margin: Large (67-100%) N/A N/A Granulation A mount: Red N/A N/A Granulation Quality: None Present (0%) N/A N/A Necrotic A mount: Fat Layer (Subcutaneous Tissue): Yes N/A N/A Exposed Structures: Fascia: No Tendon: No Muscle: No Joint: No Bone: No None N/A N/A Epithelialization: Debridement - Excisional N/A N/A Debridement: Pre-procedure Verification/Time Out 09:45 N/A N/A Taken: Callus, Subcutaneous, Slough N/A N/A Tissue Debrided: Skin/Subcutaneous Tissue N/A N/A Level: 0.36 N/A N/A Debridement A (sq cm): rea Curette N/A N/A Instrument: Minimum N/A N/A Bleeding: Pressure N/A N/A Hemostasis A chieved: 0 N/A N/A Procedural Pain: 5 N/A N/A Post Procedural Pain: Procedure was tolerated well N/A N/A Debridement Treatment Response: 0.9x0.4x0.5 N/A N/A Post Debridement Measurements L x W x D (cm) 0.141 N/A N/A Post Debridement Volume: (cm) Cellular or Tissue Based Product N/A N/A Procedures Performed: Debridement Treatment Notes Electronic Signature(s) Signed: 10/21/2021 10:01:14 AM By: Kalman Shan DO Signed: 10/21/2021 5:44:57 PM By: Deon Pilling RN, BSN Entered By: Kalman Shan on 10/21/2021  09:56:19 -------------------------------------------------------------------------------- Multi-Disciplinary Care Plan Details Patient Name: Date of Service: [name] [name] ND, JA [name] L. 10/21/2021 9:00 A M Medical Record Number: [id number] Patient Account Number: [id number] Date of Birth/Sex: Treating RN: 08-Jun-1971 (50 y.o. [name] [name] Primary Care Lativia Velie: [name] Other Clinician: Referring Feras Gardella: Treating Shreyansh Tiffany/Extender: Delano Metz in Treatment: 3 Multidisciplinary Care Plan reviewed with physician Active Inactive Wound/Skin Impairment Nursing Diagnoses: Impaired tissue integrity Goals: Patient/caregiver will verbalize understanding of skin care regimen Date Initiated: 11/19/2020 Target Resolution Date: 11/21/2021 Goal Status: Active Ulcer/skin breakdown will have a volume reduction of 30% by week 4 Date Initiated: 08/16/2020 Date Inactivated: 03/03/2021 Target Resolution Date: 03/10/2021 Goal Status: Met Interventions: Provide education on ulcer and skin care Notes: 07/24/21: Wound care regimen ongoing. Electronic Signature(s) Signed: 10/21/2021 5:53:22 PM By: Baruch Gouty RN, BSN Entered By: Baruch Gouty on 10/21/2021 09:29:46 -------------------------------------------------------------------------------- Pain Assessment Details Patient Name: Date of Service: [name] [name] ND, [place] 10/21/2021 9:00 A M Medical Record Number: [id number] Patient Account Number: [id number] Date of Birth/Sex: Treating RN: 11/20/1971 (50 y.o. [name] [name] Primary Care Latron Ribas: [name] Other Clinician: Referring Rosy Estabrook: Treating Raisha Brabender/Extender: Delano Metz in Treatment: 18 Active Problems Location of Pain Severity and Description of Pain Patient Has Paino Yes Site Locations Pain Location: Pain in Ulcers With Dressing Change: No Duration of the Pain. Constant / Intermittento  Constant Rate the pain. Current Pain Level: 5 Worst Pain Level: 10 Least Pain Level: 3 Tolerable Pain Level: 3 Character of Pain Describe the Pain: Dull Pain Management and Medication Current Pain Management: Medication: No Cold Application: No Rest: No Massage: No Activity: No T.E.N.S.: No Heat Application: No Leg drop or elevation: No Is the Current Pain Management Adequate: Inadequate How does your wound impact your activities of daily livingo Sleep: No Bathing: No Appetite: No Relationship With Others: No Bladder Continence: No Emotions: No Bowel Continence: No Work: No Toileting: No Drive: No Dressing: No Hobbies: No Electronic Signature(s) Signed: 10/21/2021 5:02:58 PM By: Dellie Catholic RN Signed: 10/21/2021 5:44:57 PM By: Deon Pilling RN, BSN Entered By: Dellie Catholic on 10/21/2021 09:10:37 -------------------------------------------------------------------------------- Patient/Caregiver Education Details Patient Name: Date of Service: [name] 11/29/2022andnbsp9:00 A M Medical Record Number: [id number] Patient Account Number: [id number] Date of Birth/Gender: Treating RN: 1970/12/13 (50 y.o. [name] [name] Primary Care Physician: [name] Other Clinician: Referring Physician: Treating Physician/Extender:  Kalman Shan [name] Weeks in Treatment: 61 Education Assessment Education Provided To: Patient Education Topics Provided Offloading: Methods: Explain/Verbal Responses: Reinforcements needed, State content correctly Wound/Skin Impairment: Methods: Explain/Verbal Responses: Reinforcements needed, State content correctly Electronic Signature(s) Signed: 10/21/2021 5:53:22 PM By: Baruch Gouty RN, BSN Entered By: Baruch Gouty on 10/21/2021 09:30:18 -------------------------------------------------------------------------------- Wound Assessment Details Patient Name: Date of Service: [name] [name] ND, JA [name]  L. 10/21/2021 9:00 A M Medical Record Number: [id number] Patient Account Number: [id number] Date of Birth/Sex: Treating RN: 25-Mar-1971 (50 y.o. [name] [name], [name] Primary Care Anas Reister: [name] Other Clinician: Referring Shalay Carder: Treating Eric Nees/Extender: Delano Metz in Treatment: 70 Wound Status Wound Number: 2R Primary Diabetic Wound/Ulcer of the Lower Extremity Etiology: Wound Location: Left Metatarsal head first Wound Status: Open Wounding Event: Blister Comorbid Anemia, Hypertension, Peripheral Venous Disease, Type II Date Acquired: 06/23/2020 History: Diabetes Weeks Of Treatment: 61 Clustered Wound: No Photos Wound Measurements Length: (cm) 0.9 Width: (cm) 0.4 Depth: (cm) 0.5 Area: (cm) 0.283 Volume: (cm) 0.141 % Reduction in Area: -812.9% % Reduction in Volume: -4600% Epithelialization: None Tunneling: No Undermining: No Wound Description Classification: Grade 2 Wound Margin: Well defined, not attached Exudate Amount: Medium Exudate Type: Serosanguineous Exudate Color: red, brown Foul Odor After Cleansing: No Slough/Fibrino No Wound Bed Granulation Amount: Large (67-100%) Exposed Structure Granulation Quality: Red Fascia Exposed: No Necrotic Amount: None Present (0%) Fat Layer (Subcutaneous Tissue) Exposed: Yes Tendon Exposed: No Muscle Exposed: No Joint Exposed: No Bone Exposed: No Treatment Notes Wound #2R (Metatarsal head first) Wound Laterality: Left Cleanser Soap and Water Discharge Instruction: May shower and wash wound with dial antibacterial soap and water prior to dressing change. Wound Cleanser Discharge Instruction: Cleanse the wound with wound cleanser prior to applying a clean dressing using gauze sponges, not tissue or cotton balls. Peri-Wound Care Sween Lotion (Moisturizing lotion) Discharge Instruction: Apply moisturizing lotion to leg Topical Primary Dressing Affinity Secondary Dressing Woven  Gauze Sponges 2x2 in Discharge Instruction: Apply over primary dressing as directed. Optifoam Non-Adhesive Dressing, 4x4 in Discharge Instruction: cut to make foam donut to help offload ADAPTIC TOUCH 3x4.25 in Discharge Instruction: Apply over primary dressing as directed. Secured With Conforming Stretch Gauze Bandage, Sterile 2x75 (in/in) Discharge Instruction: Secure with stretch gauze as directed. 46M Medipore H Soft Cloth Surgical T ape, 2x2 (in/yd) Discharge Instruction: Secure dressing with tape as directed. Compression Wrap Compression Stockings Add-Ons Electronic Signature(s) Signed: 10/21/2021 5:02:58 PM By: Dellie Catholic RN Signed: 10/21/2021 5:44:57 PM By: Deon Pilling RN, BSN Entered By: Dellie Catholic on 10/21/2021 09:25:01 -------------------------------------------------------------------------------- Vitals Details Patient Name: Date of Service: [name] [name] ND, JA [name] L. 10/21/2021 9:00 A M Medical Record Number: [id number] Patient Account Number: [id number] Date of Birth/Sex: Treating RN: 12-31-1970 (50 y.o. [name] [name] Primary Care Artemisia Auvil: [name] Other Clinician: Referring Mohmmad Saleeby: Treating Niyla Marone/Extender: Delano Metz in Treatment: 61 Vital Signs Time Taken: 09:08 Temperature (F): 98 Height (in): 77 Pulse (bpm): 87 Weight (lbs): 232 Respiratory Rate (breaths/min): 18 Body Mass Index (BMI): 27.5 Blood Pressure (mmHg): 153/95 Reference Range: 80 - 120 mg / dl Electronic Signature(s) Signed: 10/21/2021 5:02:58 PM By: Dellie Catholic RN Entered By: Dellie Catholic on 10/21/2021 09:09:37

## 2021-10-22 ENCOUNTER — Ambulatory Visit: Payer: Medicare Other | Admitting: Gastroenterology

## 2021-10-22 NOTE — Progress Notes (Deleted)
[facility] Progress Note  Chief Complaint: ***  Subjective  History: Toure was last seen 09/09/2021 after hospitalization for vomiting causing severe esophagitis in the setting of underlying chronic nausea and vomiting believed to be related to his diabetes.  No delayed gastric emptying seen on GES.  He is on metoclopramide 5 mg 3 times daily (renal dose for his CKD).  See subsequent follow-up care with endocrinology noted below  ***  ROS: Cardiovascular:  no chest pain Respiratory: no dyspnea  The patient's Past Medical, Family and Social History were reviewed and are on file in the EMR. From his recent endocrinology office note: "Reviewed recent A1c 8.4% with patient. Explained goal A1c is 7% or less with hypoglycemia avoidance. He was recently hospitalized in October. During hospitalization he had acute kidney injury and lisinopril, metformin and Wilder Glade were stopped. He has since restarted metformin and [place]. Recent GFR 33. Plan to stop [place] and metformin at this time. We will continue glipizide. He is not tolerating Trulicity due to current nausea and vomiting which is being evaluated by GI. Reviewed with patient other medication option at this time is insulin. Given his blood glucose is 88 mg/dL in office I am hesitant to start basal insulin or increase Glipizide. There has been noted discrepancy in A1c and blood glucose trends in the past. A1c may be unreliable in setting of chronic kidney disease and anemia. Fructosamine from September suggest adequate glycemic control. Plan to start correction scale insulin as needed for hyperglycemia. Will be conservative and start low-dose. Reviewed with patient the importance of monitoring blood glucose closely. He will wear professional libre CGM for 2 weeks and then follow-up to review blood glucose trends after recent medication adjustments. Reviewed with patient signs and symptoms of hypoglycemia and how to treat. He was taught how  to administer insulin. Asked him to call or message if BG is consistently less than 70 or greater than 300 mg/dl.   GFR 33 on 09/26/21. Will stop metformin and Farxiga  A FreeStyle Libre Pro 14 day continuous glucose sensor was placed on patient's Righ upper arm. He was given verbal instructions on care of the sensor, and given the FreeStyle [employer]. He is to return in about 14 days for sensor removal, or will bring the sensor back in a Ziploc bag if it falls off before 14 days. He was asked to call if the sensor falls off before 3 days or if .He has any questions or concerns.He expresses understanding and agreement with this plan. "   Objective:  Med list reviewed  Current Outpatient Medications:    AGAMATRIX ULTRA-THIN LANCETS MISC, Check blood sugars before meals twice daily, Disp: 100 each, Rfl: 11   amLODipine (NORVASC) 10 MG tablet, Take 10 mg by mouth daily., Disp: , Rfl:    ARIPiprazole (ABILIFY) 20 MG tablet, Take 20 mg by mouth daily., Disp: , Rfl:    atorvastatin (LIPITOR) 40 MG tablet, Take 40 mg by mouth daily., Disp: , Rfl:    baclofen (LIORESAL) 10 MG tablet, 1/2 tab twice a day (Patient taking differently: Take 20 mg by mouth 2 (two) times daily as needed for muscle spasms.), Disp: 30 each, Rfl: 6   desvenlafaxine (PRISTIQ) 100 MG 24 hr tablet, Take 100 mg by mouth daily., Disp: , Rfl:    FARXIGA 10 MG TABS tablet, Take 10 mg by mouth daily., Disp: , Rfl:    FLUoxetine (PROZAC) 40 MG capsule, Take 40 mg by mouth  daily., Disp: , Rfl:    fluticasone (FLONASE) 50 MCG/ACT nasal spray, Place 2 sprays into both nostrils daily as needed for allergies or rhinitis. , Disp: , Rfl:    gabapentin (NEURONTIN) 300 MG capsule, 1 cap by mouth in morning and midday, 2 caps by mouth at bedtime (Patient taking differently: Take 300-600 mg by mouth See admin instructions. Take one capsule (300 mg) by mouth in the morning and afternoon, take two capsules (600 mg) at bedtime), Disp: 120  capsule, Rfl: 11   GI Cocktail (alum & mag hydroxide-simethicone/lidocaine)oral mixture, Take 15 mLs by mouth 3 (three) times daily as needed., Disp: 180 mL, Rfl: 1   glipiZIDE (GLUCOTROL) 5 MG tablet, 1/2 tab by mouth twice daily with meal (Patient taking differently: Take 5 mg by mouth daily with supper.), Disp: 15 tablet, Rfl: 11   glucose blood (AGAMATRIX PRESTO TEST) test strip, Check blood sugars twice daily before meals, Disp: 100 each, Rfl: 12   glucose blood test strip, 1 each by Other route as needed for other. Use as instructed, Disp: , Rfl:    metoCLOPramide (REGLAN) 10 MG tablet, Take 1 tablet (10 mg total) by mouth 2 (two) times daily., Disp: 60 tablet, Rfl: 1   metoprolol tartrate (LOPRESSOR) 25 MG tablet, Take 25 mg by mouth 2 (two) times daily., Disp: , Rfl:    ondansetron (ZOFRAN) 4 MG tablet, Take 1 tablet (4 mg total) by mouth 3 (three) times daily as needed for nausea or vomiting., Disp: 30 tablet, Rfl: 0   pantoprazole (PROTONIX) 40 MG tablet, Take 1 tablet (40 mg total) by mouth 2 (two) times daily., Disp: 60 tablet, Rfl: 2   sucralfate (CARAFATE) 1 GM/10ML suspension, Take 10 mLs (1 g total) by mouth 4 (four) times daily -  with meals and at bedtime., Disp: 420 mL, Rfl: 0   TRULICITY 1.5 ZS/0.1UX SOPN, Inject 1.5 mg into the skin once a week. Friday's, Disp: , Rfl:    Vital signs in last 24 hrs: There were no vitals filed for this visit. Wt Readings from Last 3 Encounters:  09/09/21 231 lb 8 oz (105 kg)  09/02/21 216 lb 11.4 oz (98.3 kg)  07/30/21 234 lb (106.1 kg)    Physical Exam  *** HEENT: sclera anicteric, oral mucosa moist without lesions Neck: supple, no thyromegaly, JVD or lymphadenopathy Cardiac: RRR without murmurs, S1S2 heard, no peripheral edema Pulm: clear to auscultation bilaterally, normal RR and effort noted Abdomen: soft, *** tenderness, with active bowel sounds. No guarding or palpable hepatosplenomegaly. Skin; warm and dry, no jaundice or  rash  Labs:   ___________________________________________ Radiologic studies:   ____________________________________________ Other:   _____________________________________________ Assessment & Plan  Assessment: No diagnosis found.    Plan:   *** minutes were spent on this encounter (including chart review, history/exam, counseling/coordination of care, and documentation) > 50% of that time was spent on counseling and coordination of care.   Nelida Meuse III

## 2021-10-28 ENCOUNTER — Other Ambulatory Visit: Payer: Self-pay

## 2021-10-28 ENCOUNTER — Encounter (HOSPITAL_BASED_OUTPATIENT_CLINIC_OR_DEPARTMENT_OTHER): Payer: Medicare Other | Attending: Internal Medicine | Admitting: Internal Medicine

## 2021-10-28 DIAGNOSIS — E11621 Type 2 diabetes mellitus with foot ulcer: Secondary | ICD-10-CM | POA: Diagnosis present

## 2021-10-28 DIAGNOSIS — E1142 Type 2 diabetes mellitus with diabetic polyneuropathy: Secondary | ICD-10-CM | POA: Insufficient documentation

## 2021-10-28 DIAGNOSIS — L97522 Non-pressure chronic ulcer of other part of left foot with fat layer exposed: Secondary | ICD-10-CM | POA: Insufficient documentation

## 2021-10-28 DIAGNOSIS — Z89511 Acquired absence of right leg below knee: Secondary | ICD-10-CM | POA: Diagnosis not present

## 2021-10-28 NOTE — Progress Notes (Signed)
[name] [name] ([id number]) Visit Report for 10/28/2021 Chief Complaint Document Details Patient Name: Date of Service: [name] [name] 10/28/2021 12:30 PM Medical Record Number: [id number] Patient Account Number: [id number] Date of Birth/Sex: Treating RN: 10/04/1971 (50 y.o. M) Primary Care Provider: [name] Other Clinician: Referring Provider: Treating Provider/Extender: Delano Metz in Treatment: 35 Information Obtained from: Patient Chief Complaint patient is here for a review of the wound on his plantar left first metatarsal head and back of his right leg Electronic Signature(s) Signed: 10/28/2021 1:09:53 PM By: Kalman Shan DO Entered By: Kalman Shan on 10/28/2021 13:02:48 -------------------------------------------------------------------------------- Cellular or Tissue Based Product Details Patient Name: Date of Service: [name] [name], [name] 10/28/2021 12:30 PM Medical Record Number: [id number] Patient Account Number: [id number] Date of Birth/Sex: Treating RN: Apr 04, 1971 (50 y.o. [name] Primary Care Provider: [name] Other Clinician: Referring Provider: Treating Provider/Extender: Delano Metz in Treatment: 45 Cellular or Tissue Based Product Type Wound #2R Left Metatarsal head first Applied to: Performed By: Physician Kalman Shan, DO Cellular or Tissue Based Product Type: Affinity Level of Consciousness (Pre-procedure): Awake and Alert Pre-procedure Verification/Time Out Yes - 13:00 Taken: Location: genitalia / hands / feet / multiple digits Wound Size (sq cm): 0.36 Product Size (sq cm): 3 Waste Size (sq cm): 0 Amount of Product Applied (sq cm): 3 Instrument Used: Forceps, Scissors Lot #: [id number] Order #: 4 Expiration Date: 10/29/2021 Fenestrated: No Reconstituted: Yes Solution Type: normal saline Solution Amount: 3 mL Lot #: [id number] Solution Expiration  Date: 07/24/2022 Secured: Yes Secured With: Steri-Strips, adaptic Dressing Applied: No Procedural Pain: 3 Post Procedural Pain: 3 Response to Treatment: Procedure was tolerated well Level of Consciousness (Post- Awake and Alert procedure): Post Procedure Diagnosis Same as Pre-procedure Electronic Signature(s) Signed: 10/28/2021 1:09:53 PM By: Kalman Shan DO Signed: 10/28/2021 5:00:25 PM By: Deon Pilling RN, BSN Entered By: Deon Pilling on 10/28/2021 13:02:11 -------------------------------------------------------------------------------- Debridement Details Patient Name: Date of Service: [name] [name], [name] 10/28/2021 12:30 PM Medical Record Number: [id number] Patient Account Number: [id number] Date of Birth/Sex: Treating RN: Apr 01, 1971 (50 y.o. [name], [name] Primary Care Provider: [name] Other Clinician: Referring Provider: Treating Provider/Extender: Delano Metz in Treatment: 62 Debridement Performed for Assessment: Wound #2R Left Metatarsal head first Performed By: Physician Kalman Shan, DO Debridement Type: Debridement Severity of Tissue Pre Debridement: Fat layer exposed Level of Consciousness (Pre-procedure): Awake and Alert Pre-procedure Verification/Time Out Yes - 12:55 Taken: Start Time: 12:59 Pain Control: Other : benzocaine 20% T Area Debrided (L x W): otal 1 (cm) x 0.5 (cm) = 0.5 (cm) Tissue and other material debrided: Viable, Non-Viable, Slough, Subcutaneous, Skin: Dermis , Skin: Epidermis, Slough Level: Skin/Subcutaneous Tissue Debridement Description: Excisional Instrument: Curette Bleeding: Minimum Hemostasis Achieved: Pressure End Time: 13:00 Procedural Pain: 3 Post Procedural Pain: 3 Response to Treatment: Procedure was tolerated well Level of Consciousness (Post- Awake and Alert procedure): Post Debridement Measurements of Total Wound Length: (cm) 0.9 Width: (cm) 0.4 Depth: (cm) 0.6 Volume:  (cm) 0.17 Character of Wound/Ulcer Post Debridement: Improved Severity of Tissue Post Debridement: Fat layer exposed Post Procedure Diagnosis Same as Pre-procedure Electronic Signature(s) Signed: 10/28/2021 1:09:53 PM By: Kalman Shan DO Signed: 10/28/2021 5:00:25 PM By: Deon Pilling RN, BSN Entered By: Deon Pilling on 10/28/2021 13:00:22 -------------------------------------------------------------------------------- HPI Details Patient Name: Date of Service: [name] [name], [name] 10/28/2021 12:30 PM Medical Record Number: [id number] Patient Account Number: [id number] Date of Birth/Sex: Treating RN: [birth year] (50  y.o. M) Primary Care Provider: [name] Other Clinician: Referring Provider: Treating Provider/Extender: Delano Metz in Treatment: 90 History of Present Illness HPI Description: 04/21/18 ADMISSION This is a 50 year old man who is a type II diabetic. In spite of fact his hemoglobin A1c is actually quite good 5.73 months ago he is at a really difficult time over the last 6-7 months. He developed a rapidly progressive infection in the right foot in November 2018 associated with osteomyelitis and necrotizing fasciitis. 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 The stump required and a revision on 11/24/17. The stump revision was advertised as being secondary to falls. I'm not sure the progressive history here however this area is actually closed over. The patient tells me over the same timeframe he has had wounds on the plantar aspect of his left foot. In the ED saw Dr. Sharol Given in April of this year. 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 It is really not clear that this patient is been dressing this with anything. He came in with the clinic without any specific dressing on the wound areas using his own tennis shoe. He tells me that he only ambulates of course to do a pivot transfer. He does not have  his prosthesis for the right leg as of yet and he blames Medicaid for this. He does however use the foot to push himself along in his wheelchair at home. The patient has not had formal arterial studies. ABI in our clinic on the left was 1.13. Patient's past medical history includes type 2 diabetes, fracture of the right fibula. I see that he was treated for abscesses on his right buttock and chest in 2016. I did not look at the microbiology of this. 04/28/18; patient comes back in the clinic today with the wound pretty much the same as when he came in here last week. Small opening lots of undermining relatively. He tells [place] that nothing is really been on this for 3 days in spite of the fact that we gave him enough to dress this easily especially such a small wound. He says he lives with his mother, she is not capable of assisting with this he is changing the dressings himself. 05/05/18; much better-looking wound today. Smaller. There is some undermining medially however that's only perhaps 2 mm. No surrounding erythema. We've been using silver collagen 05/12/18; small wound on the first metatarsal head. No undermining no surrounding erythema. We've been using silver collagen he has home health coming out to change 05/20/18 the wound on the first metatarsal head looks better. Covered in surface debris/callus nonviable tissue. Required debridement but post debridement this looks quite good. We've been using silver collagen. He has home health 05/27/18; first metatarsal head wound continues to improve. Just about completely closed. Still a lot of surface debridement callus. We've been using silver collagen 06/06/18; the first metatarsal wound is completely healed over. There is still a lot of callus. I gently removed some of this just to make sure that there was no open area and there is not. The patient mentions to me that his left leg has felt like "lead" for about a week READMISSION 08/16/2020 This is a  50 year old man with type 2 diabetes. He returns to clinic today with a 1 month history of a blister and callus over the first metatarsal head. This is in exactly the same area as when he was here in 2019. He  has a right BKA and a prosthesis from a diabetic foot infection on the right in 2018. He has standard running shoes on the left foot to match the area in his prosthesis. He has only been covering this area with a Band-Aid has not really been specifically dressing this. ABI in our clinic on the left was 1.16. This is essentially stable from the value in 2019 10/1 the patient has a linear wound over the left first metatarsal head. Again not a lot of callus thick skin around this that I removed with a #3 curette. This is not go deep to bone or muscle it does not look to be infected. 10/8 first metatarsal head. The wound looks like it is closing however this is going to be a difficult area to offload in the future. He has a size 15 shoe and he says his shoes are years old. The inserts in these current shoes are totally useless and I have advised him to replace these. He may need a new pair of shoes and I have he got original prosthesis at [facility] I have asked him to go back there. 11/9; the patient has not been here in more than a month. Not sure he was healed when he was here the last time. I told him he would have to get new shoes and certainly offload the area over the left first metatarsal head. He has done nothing of the kind. He arrives back in clinic with the same old new balance sneakers. A very large separating callus over the first metatarsal head on the left 11/16; he has purchased new shoes and has at least 2 insoles like I asked. The wound is measuring much smaller. He is using silver alginate he changes the dressing himself 11/30; wound is measurably smaller in length of about a half a centimeter. This is generally very little depth. We have been using silver alginate. He  changes the dressing himself every second day. 12/14; wound is about the same size. Significant undermining medially relative to the size of the wound. We have been using silver alginate. There is not a way to offload this area as he walks with a prosthesis on the right leg 12/28; wound is about the same size. Again he has undermining medially. I am using polymen on this 12/02/2020; the wound looks smaller. Still a senescent edge. We have been using polymen 1/24; the wound is come down a few millimeters. Still a senescent edge. I have been using polymen 2/8; the wound is come down slightly. Still with slight undermining from 12-6 o'clock I have been using polymen partially to get offloading on this area which is opposed by his prosthesis on the other leg. 2/22 quite open improvement he still has a comma shaped wound on the left first metatarsal head. Some depth. Using polymen 3/14; he still has the same problem of a comma shaped area over the left first metatarsal head. This seems to have had more depth today at 0.6 cm. We have been using polymen. The patient changes this himself every second day. I explored the idea of a total contact cast on the left leg however this is his driving foot. He was not enamored with the idea of not being able to drive and states that he does not have anybody else to get him around 3/28; again he comes in with a smaller looking wound however there is depth and undermining requiring debridement. We have been using Hydrofera Blue,  changed to silver collagen today. The patient is doing the dressing himself 4/11; patient presents today for follow-up of his left diabetic foot wound. He denies any issues in the past 2 weeks. He is currently using silver collagen every other day with dressing changes. He does not use diabetic shoes or special inserts to his sneakers. He does not use felt pads for offloading. 4/19; patient presents for his 1 week follow-up. He denies any  issues. He reports using Hydrofera Blue and has not been using silver collagen for dressing changes. He reports minimal drainage to the wound. He overall feels well. 5/3; patient presents for 2-week follow-up. He has been using silver collagen without issues. He has no complaints today and states he overall feels well. 5/17; patient presents for 2-week follow-up. He states that the sleeve is rubbing up against the back of his right leg and causing a wound. He states this happened a week ago and has not been dressing the wound. The plantar wound is stable and he has been using collagen every other day. 5/24; Patient was had a recent hospitalization for cellulitis of his right popliteal fossa. He was admitted and started on IV antibiotics. He does not recall the plantar wound dressed while inpatient. He feels a lot better today. He was discharged on 5/21 on oral antibiotics and has not picked this up until today. He reports using collagen to the plantar wound since discharge. He is keeping the right posterior knee wound covered. He is getting a new sleeve for his prostatic on 5/26. He currently denies signs of infection. 6/1; patient presents for 1 week follow-up. He is still on doxycycline. He has not been dressing the back of the right knee wound. He states he is receiving his new prosthetic sleeve tomorrow. He states he is using silver alginate to the plantar foot wound. He currently denies signs of infection. He states he has not been offloading the left foot wound 6/7; patient presents for 1 week follow-up. He completes his last dose of doxycycline today. He finally received his prosthetic sleeve for the right side and he states it feels much better. He has been using antibiotic ointment to the back of that right knee wound. He has been using silver alginate to the plantar foot wound. He currently denies signs of infection. 6/21; patient presents for 2-week follow-up. He reports no issues with his  prosthetic sleeve. He has been using antibiotic ointment to the back of the right knee. He has been using silver alginate to the plantar foot wound. He has no issues or complaints today. He denies signs of infection. 7/5; patient presents for 2-week follow-up. He reports no issues or complaints today. He continues to use antibiotic ointment to the right posterior knee wound and silver alginate to the left plantar foot wound. He denies signs of infection. 7/19; patient presents for 2-week follow-up. He continues to use antibiotic ointment to the right posterior knee and silver alginate to the left plantar foot wound. He has no issues or complaints today. He denies signs of infection. 8/4; patient presents for 2-week follow-up. He has no issues or complaints today. He denies signs of infection. 8/18; patient presents for 2-week follow-up. He has no issues or concerns today. He has been approved for Apligraf and he would like to have this placed today. He denies signs of infection and reports that his right posterior knee wound is closed. 8/25; patient presents for 1 week follow-up. He had no issues with his  first Apligraf placement last week. He did however develop an abscess to the back of his right knee. He reports tenderness to this area. He denies systemic signs of infection. 9/1; patient presents for 1 week follow-up. He has had no issues with his plantar foot wound. The Apligraf has stayed in place over the past week. A wound culture was done at last clinic visit and a new antibiotic was sent to the pharmacy. Patient has not received this yet. He reports some mild tenderness to the back of his right knee. He denies systemic signs of infection. 9/8; patient presents for 1 week follow-up. He states he took Keflex and had diarrhea as a side effect. He was able to complete the course. He reports continued tenderness to the right posterior knee wound. He has some drainage still. He denies increased  warmth or erythema to the area. He denies fever/chills or nausea/vomiting. He has no issues or complaints today about his plantar foot wound. 9/15; patient presents for follow-up. He reports improvement in pain to the back of his right leg. He does not report any drainage. He had an ultrasound completed on 9/9 that showed a superficial fluid collection concerning for abscess. We attempted to call patient with results with no answer. We did put an urgent referral to general surgery and he reports he has not received a call. He currently denies systemic signs of infection. He reports no issues to his left plantar foot wound. 9/27; patient presents for follow-up. He has no issues or complaints today. He has had no issues with the previous wound to the back of his right leg. He has not made an appointment to see general surgery. He had Apligraf placed at last clinic visit to the plantar foot wound. He currently denies signs of infection. 10/7; patient presents for follow-up. Upon entering the room there is vomit throughout the entire floor. Patient states he does not feel well. I recommended he go to urgent care. I recommended following up next week for wound care. 10/10; patient presents for follow-up. He came last week for his follow-up however he was ill and started vomiting in the exam room. Today he states he feels better however he continues to have vomiting episodes. He states he attempted to go to urgent care/the ED but could not find a parking spot. He subsequently went home. He has no issues or complaints today regarding the foot wound. He never followed up with general surgery for the abscess identified on ultrasound to the back of his right knee. 10/20; patient presents for follow-up. He has no issues or complaints today. Overall he feels well today. 10/27; patient presents for follow-up. He has no issues or complaints today. 11/3; patient presents for follow-up. He has no issues or  complaints today. He states he does not want to follow-up with orthopedics to reevaluate the abscess in the back of his right leg. He denies signs of infection. 11/15; patient presents for follow-up. He has no issues or complaints today. He had been using silver alginate to the left first met head wound. He denies signs of infection. 11/22; patient presents for follow-up. He had Affinity placed at last clinic visit. He has no issues or complaints today. 11/29; patient presents for follow-up. He is in good spirits today. He has no issues or complaints today. 12/6; patient presents for follow-up. He has no issues or complaints today. Electronic Signature(s) Signed: 10/28/2021 1:09:53 PM By: Kalman Shan DO Entered By: Kalman Shan on 10/28/2021 13:03:06 --------------------------------------------------------------------------------  Physical Exam Details Patient Name: Date of Service: [name] [name] 10/28/2021 12:30 PM Medical Record Number: [id number] Patient Account Number: [id number] Date of Birth/Sex: Treating RN: 06/29/1971 (50 y.o. M) Primary Care Provider: [name] Other Clinician: Referring Provider: Treating Provider/Extender: Delano Metz in Treatment: 63 Constitutional respirations regular, non-labored and within target range for patient.. Cardiovascular 2+ dorsalis pedis/posterior tibialis pulses. Psychiatric pleasant and cooperative. Notes Left leg: T the first met head there is an Open wound with granulation tissue and nonviable tissue with circumferential callus. No obvious signs of infection. o Electronic Signature(s) Signed: 10/28/2021 1:09:53 PM By: Kalman Shan DO Entered By: Kalman Shan on 10/28/2021 13:03:53 -------------------------------------------------------------------------------- Physician Orders Details Patient Name: Date of Service: [name] [name], [name] 10/28/2021 12:30 PM Medical Record Number:  [id number] Patient Account Number: [id number] Date of Birth/Sex: Treating RN: October 16, 1971 (50 y.o. [name] Primary Care Provider: [name] Other Clinician: Referring Provider: Treating Provider/Extender: Delano Metz in Treatment: 83 Verbal / Phone Orders: No Diagnosis Coding ICD-10 Coding Code Description [phone] Non-pressure chronic ulcer of other part of left foot with fat layer exposed E11.621 Type 2 diabetes mellitus with foot ulcer E11.42 Type 2 diabetes mellitus with diabetic polyneuropathy Z89.511 Acquired absence of right leg below knee Follow-up Appointments ppointment in 1 week. - with Dr. Heber [place] - Tuesday Return A Cellular or Tissue Based Products Cellular or Tissue Based Product Type: - Affinity #4 Cellular or Tissue Based Product applied to wound bed, secured with steri-strips, cover with Adaptic or Mepitel. (DO NOT REMOVE). Edema Control - Lymphedema / SCD / Other Elevate legs to the level of the heart or above for 30 minutes daily and/or when sitting, a frequency of: Avoid standing for long periods of time. Off-Loading Other: - minimize walking on left foot related to pressure to wound. Aid in wound healing. Wound Treatment Wound #2R - Metatarsal head first Wound Laterality: Left Cleanser: Soap and Water 1 x Per Week/15 Days Discharge Instructions: May shower and wash wound with dial antibacterial soap and water prior to dressing change. Cleanser: Wound Cleanser (Generic) 1 x Per Week/15 Days Discharge Instructions: Cleanse the wound with wound cleanser prior to applying a clean dressing using gauze sponges, not tissue or cotton balls. Peri-Wound Care: Sween Lotion (Moisturizing lotion) 1 x Per Week/15 Days Discharge Instructions: Apply moisturizing lotion to leg Prim Dressing: Affinity ary 1 x Per Week/15 Days Secondary Dressing: Woven Gauze Sponges 2x2 in (Generic) 1 x Per Week/15 Days Discharge Instructions: Apply over  primary dressing as directed. Secondary Dressing: Optifoam Non-Adhesive Dressing, 4x4 in (Generic) 1 x Per Week/15 Days Discharge Instructions: cut to make foam donut to help offload Secondary Dressing: ADAPTIC TOUCH 3x4.25 in 1 x Per Week/15 Days Discharge Instructions: Apply over primary dressing as directed. Secured With: Child psychotherapist, Sterile 2x75 (in/in) (Generic) 1 x Per Week/15 Days Discharge Instructions: Secure with stretch gauze as directed. Secured With: 90M Medipore H Soft Cloth Surgical Tape, 2x2 (in/yd) (Generic) 1 x Per Week/15 Days Discharge Instructions: Secure dressing with tape as directed. Electronic Signature(s) Signed: 10/28/2021 1:09:53 PM By: Kalman Shan DO Entered By: Kalman Shan on 10/28/2021 13:07:23 -------------------------------------------------------------------------------- Problem List Details Patient Name: Date of Service: [name] [name], [place] 10/28/2021 12:30 PM Medical Record Number: [id number] Patient Account Number: [id number] Date of Birth/Sex: Treating RN: 06-23-1971 (50 y.o. [name] Primary Care Provider: [name] Other Clinician: Referring Provider: Treating Provider/Extender: Delano Metz in Treatment:  17 Active Problems ICD-10 Encounter Code Description Active Date MDM Diagnosis L97.522 Non-pressure chronic ulcer of other part of left foot with fat layer exposed 08/16/2020 No Yes E11.621 Type 2 diabetes mellitus with foot ulcer 08/16/2020 No Yes E11.42 Type 2 diabetes mellitus with diabetic polyneuropathy 08/16/2020 No Yes Z89.511 Acquired absence of right leg below knee 08/16/2020 No Yes Inactive Problems Resolved Problems ICD-10 Code Description Active Date Resolved Date S81.801D Unspecified open wound, right lower leg, subsequent encounter 04/08/2021 04/08/2021 Electronic Signature(s) Signed: 10/28/2021 1:09:53 PM By: Kalman Shan DO Entered By: Kalman Shan  on 10/28/2021 13:02:28 -------------------------------------------------------------------------------- Progress Note Details Patient Name: Date of Service: [name] [name], [name] 10/28/2021 12:30 PM Medical Record Number: [id number] Patient Account Number: [id number] Date of Birth/Sex: Treating RN: 1971/11/12 (50 y.o. M) Primary Care Provider: [name] Other Clinician: Referring Provider: Treating Provider/Extender: Delano Metz in Treatment: 73 Subjective Chief Complaint Information obtained from Patient patient is here for a review of the wound on his plantar left first metatarsal head and back of his right leg History of Present Illness (HPI) 04/21/18 ADMISSION This is a 50 year old man who is a type II diabetic. In spite of fact his hemoglobin A1c is actually quite good 5.73 months ago he is at a really difficult time over the last 6-7 months. He developed a rapidly progressive infection in the right foot in November 2018 associated with osteomyelitis and necrotizing fasciitis. 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 The stump required and a revision on 11/24/17. The stump revision was advertised as being secondary to falls. I'm not sure the progressive history here however this area is actually closed over. The patient tells me over the same timeframe he has had wounds on the plantar aspect of his left foot. In the ED saw Dr. Sharol Given in April of this year. 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 It is really not clear that this patient is been dressing this with anything. He came in with the clinic without any specific dressing on the wound areas using his own tennis shoe. He tells me that he only ambulates of course to do a pivot transfer. He does not have his prosthesis for the right leg as of yet and he blames Medicaid for this. He does however use the foot to push himself along in his wheelchair at home. The  patient has not had formal arterial studies. ABI in our clinic on the left was 1.13. Patient's past medical history includes type 2 diabetes, fracture of the right fibula. I see that he was treated for abscesses on his right buttock and chest in 2016. I did not look at the microbiology of this. 04/28/18; patient comes back in the clinic today with the wound pretty much the same as when he came in here last week. Small opening lots of undermining relatively. He tells [place] that nothing is really been on this for 3 days in spite of the fact that we gave him enough to dress this easily especially such a small wound. He says he lives with his mother, she is not capable of assisting with this he is changing the dressings himself. 05/05/18; much better-looking wound today. Smaller. There is some undermining medially however that's only perhaps 2 mm. No surrounding erythema. We've been using silver collagen 05/12/18; small wound on the first metatarsal head. No undermining no surrounding erythema. We've been using silver collagen he has home health coming  out to change 05/20/18 the wound on the first metatarsal head looks better. Covered in surface debris/callus nonviable tissue. Required debridement but post debridement this looks quite good. We've been using silver collagen. He has home health 05/27/18; first metatarsal head wound continues to improve. Just about completely closed. Still a lot of surface debridement callus. We've been using silver collagen 06/06/18; the first metatarsal wound is completely healed over. There is still a lot of callus. I gently removed some of this just to make sure that there was no open area and there is not. The patient mentions to me that his left leg has felt like "lead" for about a week READMISSION 08/16/2020 This is a 50 year old man with type 2 diabetes. He returns to clinic today with a 1 month history of a blister and callus over the first metatarsal head. This is  in exactly the same area as when he was here in 2019. 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 He has standard running shoes on the left foot to match the area in his prosthesis. He has only been covering this area with a Band-Aid has not really been specifically dressing this. ABI in our clinic on the left was 1.16. This is essentially stable from the value in 2019 10/1 the patient has a linear wound over the left first metatarsal head. Again not a lot of callus thick skin around this that I removed with a #3 curette. This is not go deep to bone or muscle it does not look to be infected. 10/8 first metatarsal head. The wound looks like it is closing however this is going to be a difficult area to offload in the future. He has a size 15 shoe and he says his shoes are years old. The inserts in these current shoes are totally useless and I have advised him to replace these. He may need a new pair of shoes and I have he got original prosthesis at [facility] I have asked him to go back there. 11/9; the patient has not been here in more than a month. Not sure he was healed when he was here the last time. I told him he would have to get new shoes and certainly offload the area over the left first metatarsal head. He has done nothing of the kind. He arrives back in clinic with the same old new balance sneakers. A very large separating callus over the first metatarsal head on the left 11/16; he has purchased new shoes and has at least 2 insoles like I asked. The wound is measuring much smaller. He is using silver alginate he changes the dressing himself 11/30; wound is measurably smaller in length of about a half a centimeter. This is generally very little depth. We have been using silver alginate. He changes the dressing himself every second day. 12/14; wound is about the same size. Significant undermining medially relative to the size of the wound.  We have been using silver alginate. There is not a way to offload this area as he walks with a prosthesis on the right leg 12/28; wound is about the same size. Again he has undermining medially. I am using polymen on this 12/02/2020; the wound looks smaller. Still a senescent edge. We have been using polymen 1/24; the wound is come down a few millimeters. Still a senescent edge. I have been using polymen 2/8; the wound is come down slightly.  Still with slight undermining from 12-6 o'clock I have been using polymen partially to get offloading on this area which is opposed by his prosthesis on the other leg. 2/22 quite open improvement he still has a comma shaped wound on the left first metatarsal head. Some depth. Using polymen 3/14; he still has the same problem of a comma shaped area over the left first metatarsal head. This seems to have had more depth today at 0.6 cm. We have been using polymen. The patient changes this himself every second day. I explored the idea of a total contact cast on the left leg however this is his driving foot. He was not enamored with the idea of not being able to drive and states that he does not have anybody else to get him around 3/28; again he comes in with a smaller looking wound however there is depth and undermining requiring debridement. We have been using Hydrofera Blue, changed to silver collagen today. The patient is doing the dressing himself 4/11; patient presents today for follow-up of his left diabetic foot wound. He denies any issues in the past 2 weeks. He is currently using silver collagen every other day with dressing changes. He does not use diabetic shoes or special inserts to his sneakers. He does not use felt pads for offloading. 4/19; patient presents for his 1 week follow-up. He denies any issues. He reports using Hydrofera Blue and has not been using silver collagen for dressing changes. He reports minimal drainage to the wound. He overall  feels well. 5/3; patient presents for 2-week follow-up. He has been using silver collagen without issues. He has no complaints today and states he overall feels well. 5/17; patient presents for 2-week follow-up. He states that the sleeve is rubbing up against the back of his right leg and causing a wound. He states this happened a week ago and has not been dressing the wound. The plantar wound is stable and he has been using collagen every other day. 5/24; Patient was had a recent hospitalization for cellulitis of his right popliteal fossa. He was admitted and started on IV antibiotics. He does not recall the plantar wound dressed while inpatient. He feels a lot better today. He was discharged on 5/21 on oral antibiotics and has not picked this up until today. He reports using collagen to the plantar wound since discharge. He is keeping the right posterior knee wound covered. He is getting a new sleeve for his prostatic on 5/26. He currently denies signs of infection. 6/1; patient presents for 1 week follow-up. He is still on doxycycline. He has not been dressing the back of the right knee wound. He states he is receiving his new prosthetic sleeve tomorrow. He states he is using silver alginate to the plantar foot wound. He currently denies signs of infection. He states he has not been offloading the left foot wound 6/7; patient presents for 1 week follow-up. He completes his last dose of doxycycline today. He finally received his prosthetic sleeve for the right side and he states it feels much better. He has been using antibiotic ointment to the back of that right knee wound. He has been using silver alginate to the plantar foot wound. He currently denies signs of infection. 6/21; patient presents for 2-week follow-up. He reports no issues with his prosthetic sleeve. He has been using antibiotic ointment to the back of the right knee. He has been using silver alginate to the plantar foot wound. He  has  no issues or complaints today. He denies signs of infection. 7/5; patient presents for 2-week follow-up. He reports no issues or complaints today. He continues to use antibiotic ointment to the right posterior knee wound and silver alginate to the left plantar foot wound. He denies signs of infection. 7/19; patient presents for 2-week follow-up. He continues to use antibiotic ointment to the right posterior knee and silver alginate to the left plantar foot wound. He has no issues or complaints today. He denies signs of infection. 8/4; patient presents for 2-week follow-up. He has no issues or complaints today. He denies signs of infection. 8/18; patient presents for 2-week follow-up. He has no issues or concerns today. He has been approved for Apligraf and he would like to have this placed today. He denies signs of infection and reports that his right posterior knee wound is closed. 8/25; patient presents for 1 week follow-up. He had no issues with his first Apligraf placement last week. He did however develop an abscess to the back of his right knee. He reports tenderness to this area. He denies systemic signs of infection. 9/1; patient presents for 1 week follow-up. He has had no issues with his plantar foot wound. The Apligraf has stayed in place over the past week. A wound culture was done at last clinic visit and a new antibiotic was sent to the pharmacy. Patient has not received this yet. He reports some mild tenderness to the back of his right knee. He denies systemic signs of infection. 9/8; patient presents for 1 week follow-up. He states he took Keflex and had diarrhea as a side effect. He was able to complete the course. He reports continued tenderness to the right posterior knee wound. He has some drainage still. He denies increased warmth or erythema to the area. He denies fever/chills or nausea/vomiting. He has no issues or complaints today about his plantar foot wound. 9/15;  patient presents for follow-up. He reports improvement in pain to the back of his right leg. He does not report any drainage. He had an ultrasound completed on 9/9 that showed a superficial fluid collection concerning for abscess. We attempted to call patient with results with no answer. We did put an urgent referral to general surgery and he reports he has not received a call. He currently denies systemic signs of infection. He reports no issues to his left plantar foot wound. 9/27; patient presents for follow-up. He has no issues or complaints today. He has had no issues with the previous wound to the back of his right leg. He has not made an appointment to see general surgery. He had Apligraf placed at last clinic visit to the plantar foot wound. He currently denies signs of infection. 10/7; patient presents for follow-up. Upon entering the room there is vomit throughout the entire floor. Patient states he does not feel well. I recommended he go to urgent care. I recommended following up next week for wound care. 10/10; patient presents for follow-up. He came last week for his follow-up however he was ill and started vomiting in the exam room. Today he states he feels better however he continues to have vomiting episodes. He states he attempted to go to urgent care/the ED but could not find a parking spot. He subsequently went home. He has no issues or complaints today regarding the foot wound. He never followed up with general surgery for the abscess identified on ultrasound to the back of his right knee. 10/20; patient presents for  follow-up. He has no issues or complaints today. Overall he feels well today. 10/27; patient presents for follow-up. He has no issues or complaints today. 11/3; patient presents for follow-up. He has no issues or complaints today. He states he does not want to follow-up with orthopedics to reevaluate the abscess in the back of his right leg. He denies signs of  infection. 11/15; patient presents for follow-up. He has no issues or complaints today. He had been using silver alginate to the left first met head wound. He denies signs of infection. 11/22; patient presents for follow-up. He had Affinity placed at last clinic visit. He has no issues or complaints today. 11/29; patient presents for follow-up. He is in good spirits today. He has no issues or complaints today. 12/6; patient presents for follow-up. He has no issues or complaints today. Patient History Information obtained from Patient. Family History Diabetes - Maternal Grandparents,Mother, Heart Disease - Father, Hypertension - Father, No family history of Cancer, Hereditary Spherocytosis, Kidney Disease, Lung Disease, Seizures, Stroke, Thyroid Problems, Tuberculosis. Social History Current every day smoker, Marital Status - Single, Alcohol Use - Never, Drug Use - No History, Caffeine Use - Never. Medical History Eyes Denies history of Cataracts, Glaucoma, Optic Neuritis Ear/Nose/Mouth/Throat Denies history of Chronic sinus problems/congestion, Middle ear problems Hematologic/Lymphatic Patient has history of Anemia Denies history of Hemophilia, Human Immunodeficiency Virus, Lymphedema, Sickle Cell Disease Respiratory Denies history of Aspiration, Asthma, Chronic Obstructive Pulmonary Disease (COPD), Pneumothorax, Sleep Apnea, Tuberculosis Cardiovascular Patient has history of Hypertension, Peripheral Venous Disease Denies history of Angina, Arrhythmia, Congestive Heart Failure, Coronary Artery Disease, Deep Vein Thrombosis, Hypotension, Myocardial Infarction, Peripheral Arterial Disease, Phlebitis, Vasculitis Gastrointestinal Denies history of Cirrhosis , Colitis, Crohnoos, Hepatitis A, Hepatitis B, Hepatitis C Endocrine Patient has history of Type II Diabetes - oral meds Denies history of Type I Diabetes Genitourinary Denies history of End Stage Renal Disease Immunological Denies  history of Lupus Erythematosus, Raynaudoos, Scleroderma Integumentary (Skin) Denies history of History of Burn Musculoskeletal Denies history of Gout, Rheumatoid Arthritis, Osteoarthritis, Osteomyelitis Neurologic Denies history of Dementia, Neuropathy, Quadriplegia, Paraplegia, Seizure Disorder Oncologic Denies history of Received Chemotherapy, Received Radiation Psychiatric Denies history of Anorexia/bulimia, Confinement Anxiety Hospitalization/Surgery History - fever 105, sepsis cellulitis right popliteal fossa 04/09/2021. Medical A Surgical History Notes [name] Constitutional Symptoms (General Health) falls , leukocytosis Respiratory During waking hours and catches himself not breathing, "gasp for air". Saw Dr Wynonia Lawman, he couldn't find a reason Objective Constitutional respirations regular, non-labored and within target range for patient.. Vitals Time Taken: 12:40 PM, Height: 77 in, Weight: 232 lbs, BMI: 27.5, Temperature: 97.4 F, Pulse: 94 bpm, Respiratory Rate: 20 breaths/min, Blood Pressure: 166/93 mmHg. Cardiovascular 2+ dorsalis pedis/posterior tibialis pulses. Psychiatric pleasant and cooperative. General Notes: Left leg: T the first met head there is an Open wound with granulation tissue and nonviable tissue with circumferential callus. No obvious signs o of infection. Integumentary (Hair, Skin) Wound #2R status is Open. Original cause of wound was Blister. The date acquired was: 06/23/2020. The wound has been in treatment 62 weeks. The wound is located on the Left Metatarsal head first. The wound measures 0.9cm length x 0.4cm width x 0.6cm depth; 0.283cm^2 area and 0.17cm^3 volume. There is Fat Layer (Subcutaneous Tissue) exposed. There is no tunneling noted, however, there is undermining starting at 6:00 and ending at 12:00 with a maximum distance of 0.6cm. There is a medium amount of serosanguineous drainage noted. The wound margin is well defined and not attached to the  wound  base. There is large (67-100%) red granulation within the wound bed. There is no necrotic tissue within the wound bed. General Notes: thick callous to periwound. Wound #2R status is Open. Original cause of wound was Blister. The date acquired was: 06/23/2020. The wound has been in treatment 62 weeks. The wound is located on the Left Metatarsal head first. The wound measures 0.9cm length x 0.4cm width x 0.6cm depth; 0.283cm^2 area and 0.17cm^3 volume. There is a medium amount of serosanguineous drainage noted. 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 I debrided nonviable tissue. No signs of infection on exam. Affinity #4 was placed in standard fashion. This is a hard to heal wound since patient cannot relieve pressure due to his right BKA with prosthesis. Procedures Wound #2R Pre-procedure diagnosis of Wound #2R is a Diabetic Wound/Ulcer of the Lower Extremity located on the Left Metatarsal head first .Severity of Tissue Pre Debridement is: Fat layer exposed. There was a Excisional Skin/Subcutaneous Tissue Debridement with a total area of 0.5 sq cm performed by Kalman Shan, DO. With the following instrument(s): Curette to remove Viable and Non-Viable tissue/material. Material removed includes Subcutaneous Tissue, Slough, Skin: Dermis, and Skin: Epidermis after achieving pain control using Other (benzocaine 20%). A time out was conducted at 12:55, prior to the start of the procedure. A Minimum amount of bleeding was controlled with Pressure. The procedure was tolerated well with a pain level of 3 throughout and a pain level of 3 following the procedure. Post Debridement Measurements: 0.9cm length x 0.4cm width x 0.6cm depth; 0.17cm^3 volume. Character of  Wound/Ulcer Post Debridement is improved. Severity of Tissue Post Debridement is: Fat layer exposed. Post procedure Diagnosis Wound #2R: Same as Pre-Procedure Pre-procedure diagnosis of Wound #2R is a Diabetic Wound/Ulcer of the Lower Extremity located on the Left Metatarsal head first. A skin graft procedure using a bioengineered skin substitute/cellular or tissue based product was performed by Kalman Shan, DO with the following instrument(s): Forceps and Scissors. Affinity was applied and secured with Steri-Strips and adaptic. 3 sq cm of product was utilized and 0 sq cm was wasted. Post Application, no dressing was applied. A Time Out was conducted at 13:00, prior to the start of the procedure. The procedure was tolerated well with a pain level of 3 throughout and a pain level of 3 following the procedure. Post procedure Diagnosis Wound #2R: Same as Pre-Procedure . Plan Follow-up Appointments: Return Appointment in 1 week. - with Dr. Heber [facility] - Tuesday Cellular or Tissue Based Products: Cellular or Tissue Based Product Type: - Affinity #4 Cellular or Tissue Based Product applied to wound bed, secured with steri-strips, cover with Adaptic or Mepitel. (DO NOT REMOVE). Edema Control - Lymphedema / SCD / Other: Elevate legs to the level of the heart or above for 30 minutes daily and/or when sitting, a frequency of: Avoid standing for long periods of time. Off-Loading: Other: - minimize walking on left foot related to pressure to wound. Aid in wound healing. WOUND #2R: - Metatarsal head first Wound Laterality: Left Cleanser: Soap and Water 1 x Per Week/15 Days Discharge Instructions: May shower and wash wound with dial antibacterial soap and water prior to dressing change. Cleanser: Wound Cleanser (Generic) 1 x Per Week/15 Days Discharge Instructions: Cleanse the wound with wound cleanser prior to applying  a clean dressing using gauze sponges, not tissue or cotton balls. Peri-Wound Care:  Sween Lotion (Moisturizing lotion) 1 x Per Week/15 Days Discharge Instructions: Apply moisturizing lotion to leg Prim Dressing: Affinity 1 x Per Week/15 Days ary Secondary Dressing: Woven Gauze Sponges 2x2 in (Generic) 1 x Per Week/15 Days Discharge Instructions: Apply over primary dressing as directed. Secondary Dressing: Optifoam Non-Adhesive Dressing, 4x4 in (Generic) 1 x Per Week/15 Days Discharge Instructions: cut to make foam donut to help offload Secondary Dressing: ADAPTIC TOUCH 3x4.25 in 1 x Per Week/15 Days Discharge Instructions: Apply over primary dressing as directed. Secured With: Child psychotherapist, Sterile 2x75 (in/in) (Generic) 1 x Per Week/15 Days Discharge Instructions: Secure with stretch gauze as directed. Secured With: 97M Medipore H Soft Cloth Surgical T ape, 2x2 (in/yd) (Generic) 1 x Per Week/15 Days Discharge Instructions: Secure dressing with tape as directed. 1. In office sharp debridement 2. Affinity #4 placed in standard fashion 3. Aggressive offloading 4. Follow-up in 1 week Electronic Signature(s) Signed: 10/28/2021 1:09:53 PM By: Kalman Shan DO Entered By: Kalman Shan on 10/28/2021 13:09:10 -------------------------------------------------------------------------------- HxROS Details Patient Name: Date of Service: [name] [name], [name] 10/28/2021 12:30 PM Medical Record Number: [id number] Patient Account Number: [id number] Date of Birth/Sex: Treating RN: 29-Apr-1971 (50 y.o. M) Primary Care Provider: [name] Other Clinician: Referring Provider: Treating Provider/Extender: Delano Metz in Treatment: 85 Information Obtained From Patient Constitutional Symptoms ([place]) Medical History: Past Medical History Notes: falls , leukocytosis Eyes Medical History: Negative for: Cataracts; Glaucoma; Optic Neuritis Ear/Nose/Mouth/Throat Medical History: Negative for: Chronic sinus  problems/congestion; Middle ear problems Hematologic/Lymphatic Medical History: Positive for: Anemia Negative for: Hemophilia; Human Immunodeficiency Virus; Lymphedema; Sickle Cell Disease Respiratory Medical History: Negative for: Aspiration; Asthma; Chronic Obstructive Pulmonary Disease (COPD); Pneumothorax; Sleep Apnea; Tuberculosis Past Medical History Notes: During waking hours and catches himself not breathing, "gasp for air". Saw Dr Wynonia Lawman, he couldn't find a reason Cardiovascular Medical History: Positive for: Hypertension; Peripheral Venous Disease Negative for: Angina; Arrhythmia; Congestive Heart Failure; Coronary Artery Disease; Deep Vein Thrombosis; Hypotension; Myocardial Infarction; Peripheral Arterial Disease; Phlebitis; Vasculitis Gastrointestinal Medical History: Negative for: Cirrhosis ; Colitis; Crohns; Hepatitis A; Hepatitis B; Hepatitis C Endocrine Medical History: Positive for: Type II Diabetes - oral meds Negative for: Type I Diabetes Time with diabetes: 2014 Treated with: Oral agents Blood sugar tested every day: Yes Tested : Blood sugar testing results: Breakfast: 91 Genitourinary Medical History: Negative for: End Stage Renal Disease Immunological Medical History: Negative for: Lupus Erythematosus; Raynauds; Scleroderma Integumentary (Skin) Medical History: Negative for: History of Burn Musculoskeletal Medical History: Negative for: Gout; Rheumatoid Arthritis; Osteoarthritis; Osteomyelitis Neurologic Medical History: Negative for: Dementia; Neuropathy; Quadriplegia; Paraplegia; Seizure Disorder Oncologic Medical History: Negative for: Received Chemotherapy; Received Radiation Psychiatric Medical History: Negative for: Anorexia/bulimia; Confinement Anxiety Immunizations Pneumococcal Vaccine: Received Pneumococcal Vaccination: No Implantable Devices No devices added Hospitalization / Surgery History Type of  Hospitalization/Surgery fever 105, sepsis cellulitis right popliteal fossa 04/09/2021 Family and Social History Cancer: No; Diabetes: Yes - Maternal Grandparents,Mother; Heart Disease: Yes - Father; Hereditary Spherocytosis: No; Hypertension: Yes - Father; Kidney Disease: No; Lung Disease: No; Seizures: No; Stroke: No; Thyroid Problems: No; Tuberculosis: No; Current every day smoker; Marital Status - Single; Alcohol Use: Never; Drug Use: No History; Caffeine Use: Never; Financial Concerns: No; Food, Clothing or Shelter Needs: No; Support System Lacking: No; Transportation Concerns: No Electronic Signature(s) Signed: 10/28/2021 1:09:53 PM By: Kalman Shan DO Entered By: Kalman Shan on 10/28/2021 13:03:14 -------------------------------------------------------------------------------- SuperBill  Details Patient Name: Date of Service: [name] [name] 10/28/2021 Medical Record Number: [id number] Patient Account Number: [id number] Date of Birth/Sex: Treating RN: 17-Mar-1971 (50 y.o. 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 Facility Procedures CPT4 Code: 55732202 Description: R4270 AFFINITY, 1.5X 1.5 SQ CM Modifier: Quantity: 3 CPT4 Code: 62376283 Description: 15176 - SKIN SUB GRAFT FACE/NK/HF/G ICD-10 Diagnosis Description L97.522 Non-pressure chronic ulcer of other part of left foot with fat layer exposed Modifier: Quantity: 1 Physician Procedures : CPT4 Code Description Modifier [id number] - WC PHYS SKIN SUB GRAFT FACE/NK/HF/G ICD-10 Diagnosis  Description L97.522 Non-pressure chronic ulcer of other part of left foot with fat layer exposed Quantity: 1 Electronic Signature(s) Signed: 10/28/2021 1:09:53 PM By: Kalman Shan DO Entered By: Kalman Shan on 10/28/2021 13:09:32

## 2021-10-28 NOTE — Progress Notes (Signed)
[place], [place] ([id number]) Visit Report for 10/28/2021 Arrival Information Details Patient Name: Date of Service: [name] 10/28/2021 12:30 PM Medical Record Number: [id number] Patient Account Number: [id number] Date of Birth/Sex: Treating RN: 1971-07-02 (50 y.o. [name], [name] Primary Care Gwendoline Judy: [name] Other Clinician: Referring Ziara Thelander: Treating Ranae Casebier/Extender: Delano Metz in Treatment: 69 Visit Information History Since Last Visit Added or deleted any medications: No Patient Arrived: Kasandra Knudsen Any new allergies or adverse reactions: No Arrival Time: 12:41 Had a fall or experienced change in No Accompanied By: self activities of daily living that may affect Transfer Assistance: None risk of falls: Patient Identification Verified: Yes Signs or symptoms of abuse/neglect since last visito No Secondary Verification Process Completed: Yes Hospitalized since last visit: No Patient Requires Transmission-Based Precautions: No Implantable device outside of the clinic excluding No Patient Has Alerts: No cellular tissue based products placed in the center since last visit: Has Dressing in Place as Prescribed: Yes Pain Present Now: Yes Electronic Signature(s) Signed: 10/28/2021 5:00:25 PM By: Deon Pilling RN, BSN Entered By: Deon Pilling on 10/28/2021 12:42:04 -------------------------------------------------------------------------------- Encounter Discharge Information Details Patient Name: Date of Service: [name], [name] 10/28/2021 12:30 PM Medical Record Number: [id number] Patient Account Number: [id number] Date of Birth/Sex: Treating RN: 12-Apr-1971 (50 y.o. [name] Primary Care Charisa Twitty: [name] Other Clinician: Referring Wilfredo Canterbury: Treating Cohen Boettner/Extender: Delano Metz in Treatment: 70 Encounter Discharge Information Items Post Procedure Vitals Discharge Condition:  Stable Temperature (F): 97.4 Ambulatory Status: Cane Pulse (bpm): 94 Discharge Destination: Home Respiratory Rate (breaths/min): 20 Transportation: Private Auto Blood Pressure (mmHg): 166/93 Accompanied By: self Schedule Follow-up Appointment: Yes Clinical Summary of Care: Electronic Signature(s) Signed: 10/28/2021 5:00:25 PM By: Deon Pilling RN, BSN Entered By: Deon Pilling on 10/28/2021 13:05:16 -------------------------------------------------------------------------------- Lower Extremity Assessment Details Patient Name: Date of Service: [name], [name] 10/28/2021 12:30 PM Medical Record Number: [id number] Patient Account Number: [id number] Date of Birth/Sex: Treating RN: 03-Dec-1970 (50 y.o. [name] Primary Care Caydin Yeatts: [name] Other Clinician: Referring Gem Conkle: Treating Shalik Sanfilippo/Extender: Delano Metz in Treatment: 30 Edema Assessment Assessed: [name]: Yes] [name]: No] Edema: [Left: Ye] [Right: s] Calf Left: Right: Point of Measurement: 50 cm From Medial Instep 37.5 cm Ankle Left: Right: Point of Measurement: 11 cm From Medial Instep 24 cm Vascular Assessment Pulses: Dorsalis Pedis Palpable: [Left:Yes] Electronic Signature(s) Signed: 10/28/2021 5:00:25 PM By: Deon Pilling RN, BSN Entered By: Deon Pilling on 10/28/2021 12:43:20 -------------------------------------------------------------------------------- Multi Wound Chart Details Patient Name: Date of Service: [name], [name] 10/28/2021 12:30 PM Medical Record Number: [id number] Patient Account Number: [id number] Date of Birth/Sex: Treating RN: 19-Aug-1971 (50 y.o. M) Primary Care Lakashia Collison: [name] Other Clinician: Referring Starsha Morning: Treating Lucynda Rosano/Extender: Delano Metz in Treatment: 47 Vital Signs Height(in): 77 Pulse(bpm): 94 Weight(lbs): 232 Blood Pressure(mmHg): 166/93 Body Mass Index(BMI):  28 Temperature(F): 97.4 Respiratory Rate(breaths/min): 20 Photos: [2R:No Photos Left Metatarsal head first] [N/A:N/A Left Metatarsal head first N/A] Wound Location: [2R:Blister] [N/A:Blister N/A] Wounding Event: [2R:Diabetic Wound/Ulcer of the Lower] [N/A:Diabetic Wound/Ulcer of the Lower N/A] Primary Etiology: [2R:Extremity Anemia, Hypertension, Peripheral] [N/A:Extremity Anemia, Hypertension, Peripheral N/A] Comorbid History: [2R:Venous Disease, Type II Diabetes 06/23/2020] [N/A:Venous Disease, Type II Diabetes 06/23/2020 N/A] Date Acquired: [2R:62] [N/A:62 N/A] Weeks of Treatment: [2R:Open] [N/A:Open N/A] Wound Status: [2R:Yes] [N/A:Yes N/A] Wound Recurrence: [2R:0.9x0.4x0.6] [N/A:0.9x0.4x0.6 N/A] Measurements L x W x D (cm) [2R:0.283] [N/A:0.283 N/A] A (cm) : rea [2R:0.17] [N/A:0.17 N/A] Volume (  cm) : [2R:-812.90%] [N/A:-812.90% N/A] % Reduction in A [2R:rea: -5566.70%] [N/A:-5566.70% N/A] % Reduction in Volume: [2R:6] Starting Position 1 (o'clock): [2R:12] Ending Position 1 (o'clock): [2R:0.6] Maximum Distance 1 (cm): [2R:Yes] [N/A:N/A N/A] Undermining: [2R:Grade 2] [N/A:Grade 2 N/A] Classification: [2R:Medium] [N/A:Medium N/A] Exudate A mount: [2R:Serosanguineous] [N/A:Serosanguineous N/A] Exudate Type: [2R:red, brown] [N/A:red, brown N/A] Exudate Color: [2R:Well defined, not attached] [N/A:N/A N/A] Wound Margin: [2R:Large (67-100%)] [N/A:N/A N/A] Granulation A mount: [2R:Red] [N/A:N/A N/A] Granulation Quality: [2R:None Present (0%)] [N/A:N/A N/A] Necrotic A mount: [2R:Fat Layer (Subcutaneous Tissue): Yes N/A] [N/A:N/A] Exposed Structures: [2R:Fascia: No Tendon: No Muscle: No Joint: No Bone: No None] [N/A:N/A N/A] Epithelialization: [2R:Debridement - Excisional] [N/A:Debridement - Excisional N/A] Debridement: Pre-procedure Verification/Time Out 12:55 [N/A:12:55 N/A] Taken: [2R:Other] [N/A:Other N/A] Pain Control: [2R:Subcutaneous, Slough] [N/A:Subcutaneous, Slough  N/A] Tissue Debrided: [2R:Skin/Subcutaneous Tissue] [N/A:Skin/Subcutaneous Tissue N/A] Level: [2R:0.5] [N/A:0.5 N/A] Debridement A (sq cm): [2R:rea Curette] [N/A:Curette N/A] Instrument: [2R:Minimum] [N/A:Minimum N/A] Bleeding: [2R:Pressure] [N/A:Pressure N/A] Hemostasis A chieved: [2R:3] [N/A:3 N/A] Procedural Pain: [2R:3] [N/A:3 N/A] Post Procedural Pain: [2R:Procedure was tolerated well] [N/A:Procedure was tolerated well N/A] Debridement Treatment Response: [2R:0.9x0.4x0.6] [N/A:0.9x0.4x0.6 N/A] Post Debridement Measurements L x W x D (cm) [2R:0.17] [N/A:0.17 N/A] Post Debridement Volume: (cm) [2R:thick callous to periwound.] [N/A:N/A N/A] Assessment Notes: [2R:Cellular or Tissue Based Product] [N/A:Cellular or Tissue Based Product N/A] Procedures Performed: [2R:Debridement] [N/A:Debridement] Treatment Notes Electronic Signature(s) Signed: 10/28/2021 1:09:53 PM By: Kalman Shan DO Entered By: Kalman Shan on 10/28/2021 13:02:36 -------------------------------------------------------------------------------- Multi-Disciplinary Care Plan Details Patient Name: Date of Service: [name], [name] 10/28/2021 12:30 PM Medical Record Number: [id number] Patient Account Number: [id number] Date of Birth/Sex: Treating RN: [birth year] (50 y.o. [name] Primary Care Jomayra Novitsky: [name] Other Clinician: Referring Haizel Gatchell: Treating Sonna Lipsky/Extender: Delano Metz in Treatment: 60 Multidisciplinary Care Plan reviewed with physician Active Inactive Wound/Skin Impairment Nursing Diagnoses: Impaired tissue integrity Goals: Patient/caregiver will verbalize understanding of skin care regimen Date Initiated: 11/19/2020 Target Resolution Date: 12/05/2021 Goal Status: Active Ulcer/skin breakdown will have a volume reduction of 30% by week 4 Date Initiated: 08/16/2020 Date Inactivated: 03/03/2021 Target Resolution Date: 03/10/2021 Goal Status:  Met Interventions: Provide education on ulcer and skin care Notes: 07/24/21: Wound care regimen ongoing. Electronic Signature(s) Signed: 10/28/2021 5:00:25 PM By: Deon Pilling RN, BSN Entered By: Deon Pilling on 10/28/2021 13:04:03 -------------------------------------------------------------------------------- Pain Assessment Details Patient Name: Date of Service: [name], [name] 10/28/2021 12:30 PM Medical Record Number: [id number] Patient Account Number: [id number] Date of Birth/Sex: Treating RN: 11-02-1971 (50 y.o. [name] Primary Care Aila Terra: [name] Other Clinician: Referring Lakoda Raske: Treating Penney Domanski/Extender: Delano Metz in Treatment: 81 Active Problems Location of Pain Severity and Description of Pain Patient Has Paino Yes Site Locations Pain Location: Pain in Ulcers Rate the pain. Current Pain Level: 6 Worst Pain Level: 10 Least Pain Level: 0 Tolerable Pain Level: 8 Pain Management and Medication Current Pain Management: Medication: No Cold Application: No Rest: No Massage: No Activity: No T.E.N.S.: No Heat Application: No Leg drop or elevation: No Is the Current Pain Management Adequate: Adequate How does your wound impact your activities of daily livingo Sleep: No Bathing: No Appetite: No Relationship With Others: No Bladder Continence: No Emotions: No Bowel Continence: No Work: No Toileting: No Drive: No Dressing: No Hobbies: No Engineer, maintenance) Signed: 10/28/2021 5:00:25 PM By: Deon Pilling RN, BSN Entered By: Deon Pilling on 10/28/2021 12:42:32 -------------------------------------------------------------------------------- Patient/Caregiver Education Details Patient Name: Date of Service: [name], [name] 12/6/2022andnbsp12:30  PM Medical Record Number: [id number] Patient Account Number: [id number] Date of Birth/Gender: Treating RN: 1971/09/05 (50 y.o. [name] Primary Care Physician: [name] Other Clinician: Referring Physician: Treating Physician/Extender: Delano Metz in Treatment: 45 Education Assessment Education Provided To: Patient Education Topics Provided Wound/Skin Impairment: Handouts: Skin Care Do's and Dont's Methods: Explain/Verbal Responses: Reinforcements needed Electronic Signature(s) Signed: 10/28/2021 5:00:25 PM By: Deon Pilling RN, BSN Entered By: Deon Pilling on 10/28/2021 13:04:17 -------------------------------------------------------------------------------- Wound Assessment Details Patient Name: Date of Service: [name], [name] 10/28/2021 12:30 PM Medical Record Number: [id number] Patient Account Number: [id number] Date of Birth/Sex: Treating RN: [birth year] (50 y.o. [name], [name] Primary Care Evann Koelzer: [name] Other Clinician: Referring Tanieka Pownall: Treating Asencion Loveday/Extender: Delano Metz in Treatment: 97 Wound Status Wound Number: 2R Primary Diabetic Wound/Ulcer of the Lower Extremity Etiology: Wound Location: Left Metatarsal head first Wound Status: Open Wounding Event: Blister Comorbid Anemia, Hypertension, Peripheral Venous Disease, Type II Date Acquired: 06/23/2020 History: Diabetes Weeks Of Treatment: 62 Clustered Wound: No Wound Measurements Length: (cm) 0.9 Width: (cm) 0.4 Depth: (cm) 0.6 Area: (cm) 0.283 Volume: (cm) 0.17 % Reduction in Area: -812.9% % Reduction in Volume: -5566.7% Epithelialization: None Tunneling: No Undermining: Yes Starting Position (o'clock): 6 Ending Position (o'clock): 12 Maximum Distance: (cm) 0.6 Wound Description Classification: Grade 2 Wound Margin: Well defined, not attached Exudate Amount: Medium Exudate Type: Serosanguineous Exudate Color: red, brown Foul Odor After Cleansing: No Slough/Fibrino No Wound Bed Granulation Amount: Large (67-100%) Exposed  Structure Granulation Quality: Red Fascia Exposed: No Necrotic Amount: None Present (0%) Fat Layer (Subcutaneous Tissue) Exposed: Yes Tendon Exposed: No Muscle Exposed: No Joint Exposed: No Bone Exposed: No Assessment Notes thick callous to periwound. Electronic Signature(s) Signed: 10/28/2021 5:00:25 PM By: Deon Pilling RN, BSN Entered By: Deon Pilling on 10/28/2021 12:47:03 -------------------------------------------------------------------------------- Wound Assessment Details Patient Name: Date of Service: [name], [name] 10/28/2021 12:30 PM Medical Record Number: [id number] Patient Account Number: [id number] Date of Birth/Sex: Treating RN: 1971-09-04 (49 y.o. [name], [name] Primary Care Tremane Spurgeon: [name] Other Clinician: Referring Samarra Ridgely: Treating Jaydalyn Demattia/Extender: Delano Metz in Treatment: 2 Wound Status Wound Number: 2R Primary Diabetic Wound/Ulcer of the Lower Extremity Etiology: Wound Location: Left Metatarsal head first Wound Status: Open Wounding Event: Blister Comorbid Anemia, Hypertension, Peripheral Venous Disease, Type II Date Acquired: 06/23/2020 History: Diabetes Weeks Of Treatment: 62 Clustered Wound: No Photos Wound Measurements Length: (cm) 0.9 Width: (cm) 0.4 Depth: (cm) 0.6 Area: (cm) 0.283 Volume: (cm) 0.17 % Reduction in Area: -812.9% % Reduction in Volume: -5566.7% Wound Description Classification: Grade 2 Exudate Amount: Medium Exudate Type: Serosanguineous Exudate Color: red, brown Treatment Notes Wound #2R (Metatarsal head first) Wound Laterality: Left Cleanser Soap and Water Discharge Instruction: May shower and wash wound with dial antibacterial soap and water prior to dressing change. Wound Cleanser Discharge Instruction: Cleanse the wound with wound cleanser prior to applying a clean dressing using gauze sponges, not tissue or cotton balls. Peri-Wound Care Sween Lotion  (Moisturizing lotion) Discharge Instruction: Apply moisturizing lotion to leg Topical Primary Dressing Affinity Secondary Dressing Woven Gauze Sponges 2x2 in Discharge Instruction: Apply over primary dressing as directed. Optifoam Non-Adhesive Dressing, 4x4 in Discharge Instruction: cut to make foam donut to help offload ADAPTIC TOUCH 3x4.25 in Discharge Instruction: Apply over primary dressing as directed. Secured With Conforming Stretch Gauze Bandage, Sterile 2x75 (in/in) Discharge Instruction: Secure with stretch gauze as directed. 73M Medipore H Soft Cloth Surgical T ape, 2x2 (in/yd)  Discharge Instruction: Secure dressing with tape as directed. Compression Wrap Compression Stockings Add-Ons Electronic Signature(s) Signed: 10/28/2021 2:27:55 PM By: Sandre Kitty Signed: 10/28/2021 5:00:25 PM By: Deon Pilling RN, BSN Entered By: Sandre Kitty on 10/28/2021 12:47:30 -------------------------------------------------------------------------------- Vitals Details Patient Name: Date of Service: [name], [name] 10/28/2021 12:30 PM Medical Record Number: [id number] Patient Account Number: [id number] Date of Birth/Sex: Treating RN: Oct 03, 1971 (50 y.o. [name] Primary Care Gurbani Figge: [name] Other Clinician: Referring Rosaisela Jamroz: Treating Vaishnavi Dalby/Extender: Delano Metz in Treatment: 44 Vital Signs Time Taken: 12:40 Temperature (F): 97.4 Height (in): 77 Pulse (bpm): 94 Weight (lbs): 232 Respiratory Rate (breaths/min): 20 Body Mass Index (BMI): 27.5 Blood Pressure (mmHg): 166/93 Reference Range: 80 - 120 mg / dl Electronic Signature(s) Signed: 10/28/2021 5:00:25 PM By: Deon Pilling RN, BSN Entered By: Deon Pilling on 10/28/2021 12:42:18

## 2021-11-01 ENCOUNTER — Other Ambulatory Visit: Payer: Self-pay

## 2021-11-01 ENCOUNTER — Encounter (HOSPITAL_COMMUNITY): Payer: Self-pay | Admitting: Emergency Medicine

## 2021-11-01 ENCOUNTER — Emergency Department (HOSPITAL_COMMUNITY)
Admission: EM | Admit: 2021-11-01 | Discharge: 2021-11-02 | Disposition: A | Discharge status: Home / Self Care | Payer: Medicare Other | Attending: Emergency Medicine | Admitting: Emergency Medicine

## 2021-11-01 ENCOUNTER — Emergency Department (HOSPITAL_COMMUNITY): Payer: Medicare Other

## 2021-11-01 DIAGNOSIS — M25562 Pain in left knee: Secondary | ICD-10-CM | POA: Insufficient documentation

## 2021-11-01 DIAGNOSIS — W19XXXA Unspecified fall, initial encounter: Secondary | ICD-10-CM | POA: Diagnosis not present

## 2021-11-01 DIAGNOSIS — E119 Type 2 diabetes mellitus without complications: Secondary | ICD-10-CM | POA: Diagnosis not present

## 2021-11-01 DIAGNOSIS — F17210 Nicotine dependence, cigarettes, uncomplicated: Secondary | ICD-10-CM | POA: Diagnosis not present

## 2021-11-01 DIAGNOSIS — Z79899 Other long term (current) drug therapy: Secondary | ICD-10-CM | POA: Insufficient documentation

## 2021-11-01 DIAGNOSIS — I10 Essential (primary) hypertension: Secondary | ICD-10-CM | POA: Insufficient documentation

## 2021-11-01 NOTE — ED Provider Notes (Signed)
Emergency Medicine Provider Triage Evaluation Note  [name] [name] , a 50 y.o. male  was evaluated in triage.  Pt complains of posterior left knee pain.  He tripped two days ago at a gas station.  He denies any other injuries.  He reports that he has fallen a few times since due to his knee giving out.  Denies any other injuries.   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 Left knee TTP on the posterior aspect, medially. No deformity or creptitis noted.  Other:  Sensation present to light touch to left foot.   Medical Decision Making  Medically screening exam initiated at 5:41 PM.  Appropriate orders placed.  [name] was informed that the remainder of the evaluation will be completed by another provider, this initial triage assessment does not replace that evaluation, and the importance of remaining in the ED until their evaluation is complete.  Note: Portions of this report may have been transcribed using voice recognition software. Every effort was made to ensure accuracy; however, inadvertent computerized transcription errors may be present    Ollen Gross 11/01/21 1750    Luna Fuse, MD 11/03/21 [phone]

## 2021-11-01 NOTE — ED Triage Notes (Signed)
Pt tripped and fell 2 days ago.  C/o pain to posterior L knee.  Denies any other injury.

## 2021-11-02 MED ORDER — ACETAMINOPHEN 500 MG PO TABS
1000.0000 mg | ORAL_TABLET | Freq: Once | ORAL | Status: AC
  Administered 2021-11-02: 1000 mg via ORAL
  Filled 2021-11-02: qty 2

## 2021-11-02 NOTE — ED Notes (Signed)
Reports falling at [place]. Tripped over sign. Landed on left side. No LOC, no trauma to head. Only complaint is left knee pain.

## 2021-11-02 NOTE — Discharge Instructions (Signed)
For pain control you may take at 1000 mg of Tylenol every 8 hours as needed. 

## 2021-11-02 NOTE — ED Notes (Signed)
Pt verbalized understanding of d/c instructions, meds, and followup care. Denies questions. VSS, no distress noted. Pt given PO meds and ambulates to lobby on own ability.

## 2021-11-02 NOTE — ED Provider Notes (Signed)
[facility] Provider Note  CSN: [id number] Arrival date & time: 11/01/21 1533  Chief Complaint(s) Fall and Knee Pain  HPI [name] [name] is a 50 y.o. male    Knee Pain Location:  Knee Time since incident:  2 days Injury: yes   Mechanism of injury: fall   Fall:    Fall occurred:  Standing   Point of impact:  Knees Knee location:  L knee Pain details:    Quality:  Aching   Severity:  Moderate   Onset quality:  Gradual   Duration:  2 days   Timing:  Constant   Progression:  Waxing and waning Chronicity:  New Relieved by:  Immobilization Worsened by:  Bearing weight and activity Associated symptoms: no back pain, no decreased ROM, no numbness, no swelling and no tingling    Past Medical History Past Medical History:  Diagnosis Date   Allergy    seasonal and environmental   Anemia    ARF (acute renal failure) ([place]) 09/2017   Chronic constipation 05/13/2020   Depression    Diabetes mellitus without complication ([place]) 7035   Hyperlipidemia    Hypertension    Necrotizing fasciitis ([place]) 10/13/2017   Neuromuscular disorder ([place])    neuropathy   Wound dehiscence 11/24/2017   Patient Active Problem List   Diagnosis Date Noted   Acute renal failure superimposed on stage 3b chronic kidney disease ([place]) 09/02/2021   GERD with esophagitis 09/02/2021   Lactic acidosis 09/02/2021   Hematemesis 09/02/2021   Cellulitis 04/10/2021   Severe sepsis ([place]) 04/10/2021   AKI (acute kidney injury) ([place]) 04/10/2021   Cellulitis of both lower extremities 04/10/2021   Achilles tendon contracture, left 03/03/2018   Non-pressure chronic ulcer of other part of left foot limited to breakdown of skin ([place]) 02/02/2018   Ulnar nerve damage, initial encounter 01/05/2018   History of right below knee amputation ([place]) 12/22/2017   Fever    Acute posthemorrhagic anemia    Leukocytosis    Falls 11/24/2017   Essential hypertension 11/24/2017    Normocytic normochromic anemia 11/24/2017   Sepsis ([place]) 10/11/2017   Uncontrolled type 2 diabetes mellitus with hyperglycemia, with long-term current use of insulin ([place]) 10/11/2017   ARF (acute renal failure) ([place]) 10/11/2017   Nausea & vomiting 10/11/2017   Home Medication(s) Prior to Admission medications   Medication Sig Start Date End Date Taking? Authorizing Provider  Truddie Crumble ULTRA-THIN LANCETS MISC Check blood sugars before meals twice daily 11/19/17   Mack Hook, MD  amLODipine (NORVASC) 10 MG tablet Take 10 mg by mouth daily.    [provider]  ARIPiprazole (ABILIFY) 20 MG tablet Take 20 mg by mouth daily. 08/06/19   [provider]  atorvastatin (LIPITOR) 40 MG tablet Take 40 mg by mouth daily. 08/11/21   [provider]  baclofen (LIORESAL) 10 MG tablet 1/2 tab twice a day Patient taking differently: Take 20 mg by mouth 2 (two) times daily as needed for muscle spasms. 01/28/18   Mack Hook, MD  desvenlafaxine (PRISTIQ) 100 MG 24 hr tablet Take 100 mg by mouth daily. 02/20/20   [provider]  FARXIGA 10 MG TABS tablet Take 10 mg by mouth daily. 09/03/21   [provider]  FLUoxetine (PROZAC) 40 MG capsule Take 40 mg by mouth daily. 03/26/21   [provider]  fluticasone (FLONASE) 50 MCG/ACT nasal spray Place 2 sprays into both nostrils daily as needed for allergies or rhinitis.  01/09/19  [provider]  gabapentin (NEURONTIN) 300 MG capsule 1 cap by mouth in morning and midday, 2 caps by mouth at bedtime Patient taking differently: Take 300-600 mg by mouth See admin instructions. Take one capsule (300 mg) by mouth in the morning and afternoon, take two capsules (600 mg) at bedtime 11/19/17   Mack Hook, MD  GI Cocktail (alum & mag hydroxide-simethicone/lidocaine)oral mixture Take 15 mLs by mouth 3 (three) times daily as needed. 09/11/21   Doran Stabler, MD  glipiZIDE (GLUCOTROL) 5 MG  tablet 1/2 tab by mouth twice daily with meal Patient taking differently: Take 5 mg by mouth daily with supper. 11/19/17   Mack Hook, MD  glucose blood (AGAMATRIX PRESTO TEST) test strip Check blood sugars twice daily before meals 02/02/18   Mack Hook, MD  glucose blood test strip 1 each by Other route as needed for other. Use as instructed    [provider]  metoCLOPramide (REGLAN) 10 MG tablet Take 1 tablet (10 mg total) by mouth 2 (two) times daily. 09/09/21   Doran Stabler, MD  metoprolol tartrate (LOPRESSOR) 25 MG tablet Take 25 mg by mouth 2 (two) times daily. 02/21/21   [provider]  ondansetron (ZOFRAN) 4 MG tablet Take 1 tablet (4 mg total) by mouth 3 (three) times daily as needed for nausea or vomiting. 09/05/21   Sharyn Creamer, MD  pantoprazole (PROTONIX) 40 MG tablet Take 1 tablet (40 mg total) by mouth 2 (two) times daily. 04/23/21   Daleen Bo, MD  sucralfate (CARAFATE) 1 GM/10ML suspension Take 10 mLs (1 g total) by mouth 4 (four) times daily -  with meals and at bedtime. 09/03/21   Richarda Osmond, MD  TRULICITY 1.5 OI/7.1IW SOPN Inject 1.5 mg into the skin once a week. Friday's 07/30/21   [provider]                                                                                                                                    Past Surgical History Past Surgical History:  Procedure Laterality Date   AMPUTATION Right 10/13/2017   Procedure: RIGHT BELOW KNEE AMPUTATION;  Surgeon: [name], MD;  Location: [facility];  Service: Orthopedics;  Laterality: Right;   AMPUTATION Right 11/24/2017   Procedure: AMPUTATION BELOW KNEE REVISION;  Surgeon: [name], MD;  Location: [facility];  Service: Orthopedics;  Laterality: Right;   BIOPSY  09/02/2021   Procedure: BIOPSY;  Surgeon: Sharyn Creamer, MD;  Location: [facility] ENDOSCOPY;  Service: Gastroenterology;;   COLONOSCOPY  05/11/2019   ESOPHAGOGASTRODUODENOSCOPY (EGD) WITH PROPOFOL  N/A 09/02/2021   Procedure: ESOPHAGOGASTRODUODENOSCOPY (EGD) WITH PROPOFOL;  Surgeon: Sharyn Creamer, MD;  Location: [facility];  Service: Gastroenterology;  Laterality: N/A;   NO PAST SURGERIES     POLYPECTOMY     WISDOM TOOTH EXTRACTION     Family History Family History  Problem Relation Age of Onset   Diabetes Mellitus II Mother    Depression Mother    Colon polyps Mother    Heart disease Father        CABG x 4.  04/2017   Colon cancer Neg Hx    Esophageal cancer Neg Hx    Liver cancer Neg Hx    Pancreatic cancer Neg Hx    Rectal cancer Neg Hx    Stomach cancer Neg Hx     Social History Social History   Tobacco Use   Smoking status: Every Day    Packs/day: 0.50    Years: 35.00    Pack years: 17.50    Types: Cigarettes   Smokeless tobacco: Never   Tobacco comments:    stopped, but back on 1 cigarette daily.  Vaping Use   Vaping Use: Never used  Substance Use Topics   Alcohol use: No   Drug use: No   Allergies Adhesive [tape]  Review of Systems Review of Systems  Musculoskeletal:  Negative for back pain.  All other systems are reviewed and are negative for acute change except as noted in the HPI  Physical Exam Vital Signs  I have reviewed the triage vital signs BP (!) 157/101 (BP Location: Right Arm)   Pulse 90   Temp 98.7 F (37.1 C) (Oral)   Resp 18   SpO2 100%   Physical Exam Vitals reviewed.  Constitutional:      General: He is not in acute distress.    Appearance: He is well-developed. He is not diaphoretic.  HENT:     Head: Normocephalic and atraumatic.     Right Ear: External ear normal.     Left Ear: External ear normal.     Nose: Nose normal.     Mouth/Throat:     Mouth: Mucous membranes are moist.  Eyes:     General: No scleral icterus.    Conjunctiva/sclera: Conjunctivae normal.  Neck:     Trachea: Phonation normal.  Cardiovascular:     Rate and Rhythm: Normal rate and regular rhythm.  Pulmonary:     Effort: Pulmonary effort  is normal. No respiratory distress.     Breath sounds: No stridor.  Abdominal:     General: There is no distension.  Musculoskeletal:        General: Normal range of motion.     Cervical back: Normal range of motion.     Left knee: Ecchymosis (anterior) present. No swelling, deformity or lacerations. Normal range of motion. Tenderness present. No medial joint line, lateral joint line, MCL, LCL, ACL, PCL or patellar tendon tenderness. No LCL laxity, MCL laxity, ACL laxity or PCL laxity.Normal alignment, normal meniscus and normal patellar mobility.     Instability Tests: Anterior drawer test negative. Posterior drawer test negative.  Neurological:     Mental Status: He is alert and oriented to person, place, and time.  Psychiatric:        Behavior: Behavior normal.    ED Results and Treatments Labs (all labs ordered are listed, but only abnormal results are displayed) Labs Reviewed - No data to display  EKG  EKG Interpretation  Date/Time:    Ventricular Rate:    PR Interval:    QRS Duration:   QT Interval:    QTC Calculation:   R Axis:     Text Interpretation:         Radiology DG Knee Complete 4 Views Left  Result Date: 11/01/2021 CLINICAL DATA:  Left knee pain after fall 2 days ago EXAM: LEFT KNEE - COMPLETE 4+ VIEW COMPARISON:  None. FINDINGS: No evidence of fracture or dislocation. Small joint effusion. No evidence of significant arthropathy or other focal bone abnormality. Soft tissues are unremarkable. IMPRESSION: Small knee joint effusion without acute osseous abnormality. Electronically Signed   By: Davina Poke D.O.   On: 11/01/2021 18:45    Pertinent labs & imaging results that were available during my care of the patient were reviewed by me and considered in my medical decision making (see MDM for details).  Medications Ordered in ED Medications   acetaminophen (TYLENOL) tablet 1,000 mg (has no administration in time range)                                                                                                                                     Procedures Procedures  (including critical care time)  Medical Decision Making / ED Course I have reviewed the nursing notes for this encounter and the patient's prior records (if available in EHR or on provided paperwork).  TARELL SCHOLLMEYER was evaluated in Emergency Department on 11/02/2021 for the symptoms described in the history of present illness. He was evaluated in the context of the global COVID-19 pandemic, which necessitated consideration that the patient might be at risk for infection with the SARS-CoV-2 virus that causes COVID-19. Institutional protocols and algorithms that pertain to the evaluation of patients at risk for COVID-19 are in a state of rapid change based on information released by regulatory bodies including the CDC and federal and state organizations. These policies and algorithms were followed during the patient's care in the ED.     Mechanical fall resulting in left knee pain Plain film w/o acute fracture or dislocation. Small joint effusion - likely from fall. Recommended hinged brace. OTC meds for pain control PCP follow if pain persist for more than 2 weeks.  Pertinent labs & imaging results that were available during my care of the patient were reviewed by me and considered in my medical decision making:    Final Clinical Impression(s) / ED Diagnoses Final diagnoses:  Fall, initial encounter  Acute pain of left knee    The patient appears reasonably screened and/or stabilized for discharge and I doubt any other medical condition or other [facility] requiring further screening, evaluation, or treatment in the ED at this time prior to discharge. Safe for discharge with strict return precautions.  Disposition: Discharge  Condition: Good  I have  discussed the results, Dx and Tx plan with the  patient/family who expressed understanding and agree(s) with the plan. Discharge instructions discussed at length. The patient/family was given strict return precautions who verbalized understanding of the instructions. No further questions at time of discharge.    ED Discharge Orders     None        Follow Up: [name], NP [place] [address] [phone]  Call  if symptoms do not improve or  worsen in 2 weeks   This chart was dictated using voice recognition software.  Despite best efforts to proofread,  errors can occur which can change the documentation meaning.    Fatima Blank, MD 11/02/21 [phone]

## 2021-11-04 ENCOUNTER — Encounter (HOSPITAL_BASED_OUTPATIENT_CLINIC_OR_DEPARTMENT_OTHER): Payer: Medicare Other | Admitting: Internal Medicine

## 2021-11-04 ENCOUNTER — Other Ambulatory Visit: Payer: Self-pay

## 2021-11-04 DIAGNOSIS — L97522 Non-pressure chronic ulcer of other part of left foot with fat layer exposed: Secondary | ICD-10-CM

## 2021-11-04 DIAGNOSIS — E11621 Type 2 diabetes mellitus with foot ulcer: Secondary | ICD-10-CM | POA: Diagnosis not present

## 2021-11-04 NOTE — Progress Notes (Signed)
[name] [name] ([id number]) Visit Report for 11/04/2021 Chief Complaint Document Details Patient Name: Date of Service: [name] [name] 11/04/2021 2:30 PM Medical Record Number: [id number] Patient Account Number: [id number] Date of Birth/Sex: Treating RN: 01/26/1971 (50 y.o. [name] [name] Primary Care Provider: Ferd [name] Other Clinician: Referring Provider: Treating Provider/Extender: [name] [name] in Treatment: 30 Information Obtained from: Patient Chief Complaint patient is here for a review of the wound on his plantar left first metatarsal head and back of his right leg Electronic Signature(s) Signed: 11/04/2021 3:20:51 PM By: [name] Shan DO Entered By: [name] [name] on 11/04/2021 15:17:07 -------------------------------------------------------------------------------- Cellular or Tissue Based Product Details Patient Name: Date of Service: [name] [name], [name] 11/04/2021 2:30 PM Medical Record Number: [id number] Patient Account Number: [id number] Date of Birth/Sex: Treating RN: June 05, 1971 (50 y.o. [name] [name] Primary Care Provider: Ferd [name] Other Clinician: Referring Provider: Treating Provider/Extender: [name] [name] in Treatment: 37 Cellular or Tissue Based Product Type Wound #2R Left Metatarsal head first Applied to: Performed By: Physician [name] Shan, DO Cellular or Tissue Based Product Type: Affinity Level of Consciousness (Pre-procedure): Awake and Alert Pre-procedure Verification/Time Out Yes - 15:02 Taken: Location: genitalia / hands / feet / multiple digits Wound Size (sq cm): 0.24 Product Size (sq cm): 3 Waste Size (sq cm): 0 Amount of Product Applied (sq cm): 3 Instrument Used: Forceps Lot #: [id number] Order #: [id number] Expiration Date: 11/05/2021 Fenestrated: No Reconstituted: No Secured: Yes Secured With: Steri-Strips Dressing Applied: Yes Primary Dressing:  adaptic. gauze Procedural Pain: 0 Post Procedural Pain: 0 Response to Treatment: Procedure was tolerated well Level of Consciousness (Post- Awake and Alert procedure): Post Procedure Diagnosis Same as Pre-procedure Electronic Signature(s) Signed: 11/04/2021 3:20:51 PM By: [name] Shan DO Signed: 11/04/2021 5:44:31 PM By: [name] Gouty RN, BSN Entered By: [name] [name] on 11/04/2021 15:05:08 -------------------------------------------------------------------------------- Debridement Details Patient Name: Date of Service: [name] [name], [name] 11/04/2021 2:30 PM Medical Record Number: [id number] Patient Account Number: [id number] Date of Birth/Sex: Treating RN: February 20, 1971 (50 y.o. [name] [name] Primary Care Provider: Ferd [name] Other Clinician: Referring Provider: Treating Provider/Extender: [name] [name] in Treatment: 94 Debridement Performed for Assessment: Wound #2R Left Metatarsal head first Performed By: Physician [name] Shan, DO Debridement Type: Debridement Severity of Tissue Pre Debridement: Fat layer exposed Level of Consciousness (Pre-procedure): Awake and Alert Pre-procedure Verification/Time Out Yes - 15:55 Taken: Start Time: 15:55 Pain Control: Lidocaine 4% T opical Solution T Area Debrided (L x W): otal 1 (cm) x 1 (cm) = 1 (cm) Tissue and other material debrided: Non-Viable, Callus, Subcutaneous, Skin: Epidermis Level: Skin/Subcutaneous Tissue Debridement Description: Excisional Instrument: Blade, Curette, Forceps Bleeding: Minimum Hemostasis Achieved: Pressure Procedural Pain: 3 Post Procedural Pain: 3 Response to Treatment: Procedure was tolerated well Level of Consciousness (Post- Awake and Alert procedure): Post Debridement Measurements of Total Wound Length: (cm) 1 Width: (cm) 0.5 Depth: (cm) 0.5 Volume: (cm) 0.196 Character of Wound/Ulcer Post Debridement: Improved Severity of Tissue Post  Debridement: Fat layer exposed Post Procedure Diagnosis Same as Pre-procedure Electronic Signature(s) Signed: 11/04/2021 3:20:51 PM By: [name] Shan DO Signed: 11/04/2021 5:44:31 PM By: [name] Gouty RN, BSN Entered By: [name] [name] on 11/04/2021 15:01:24 -------------------------------------------------------------------------------- HPI Details Patient Name: Date of Service: [name] [name], [name] 11/04/2021 2:30 PM Medical Record Number: [id number] Patient Account Number: [id number] Date of Birth/Sex: Treating RN: Jan 02, 1971 (50 y.o. [name] [name] Primary Care Provider: Ferd [name] Other Clinician: Referring Provider: Treating Provider/Extender: [name] [name]  [name] [name], [name] [name] in Treatment: 63 History of Present Illness HPI Description: 04/21/18 ADMISSION This is a 50 year old man who is a type II diabetic. In spite of fact his hemoglobin A1c is actually quite good 5.73 months ago he is at a really difficult time over the last 6-7 months. He developed a rapidly progressive infection in the right foot in November 2018 associated with osteomyelitis and necrotizing fasciitis. 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 The stump required and a revision on 11/24/17. The stump revision was advertised as being secondary to falls. I'm not sure the progressive history here however this area is actually closed over. The patient tells me over the same timeframe he has had wounds on the plantar aspect of his left foot. In the ED saw Dr. Sharol [name] in April of this year. 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 It is really not clear that this patient is been dressing this with anything. He came in with the clinic without any specific dressing on the wound areas using his own tennis shoe. He tells me that he only ambulates of course to do a pivot transfer. He does not have his prosthesis for the right leg as of yet and he blames Medicaid  for this. He does however use the foot to push himself along in his wheelchair at home. The patient has not had formal arterial studies. ABI in our clinic on the left was 1.13. Patient's past medical history includes type 2 diabetes, fracture of the right fibula. I see that he was treated for abscesses on his right buttock and chest in 2016. I did not look at the microbiology of this. 04/28/18; patient comes back in the clinic today with the wound pretty much the same as when he came in here last week. Small opening lots of undermining relatively. He tells [place] that nothing is really been on this for 3 days in spite of the fact that we gave him enough to dress this easily especially such a small wound. He says he lives with his mother, she is not capable of assisting with this he is changing the dressings himself. 05/05/18; much better-looking wound today. Smaller. There is some undermining medially however that's only perhaps 2 mm. No surrounding erythema. We've been using silver collagen 05/12/18; small wound on the first metatarsal head. No undermining no surrounding erythema. We've been using silver collagen he has home health coming out to change 05/20/18 the wound on the first metatarsal head looks better. Covered in surface debris/callus nonviable tissue. Required debridement but post debridement this looks quite good. We've been using silver collagen. He has home health 05/27/18; first metatarsal head wound continues to improve. Just about completely closed. Still a lot of surface debridement callus. We've been using silver collagen 06/06/18; the first metatarsal wound is completely healed over. There is still a lot of callus. I gently removed some of this just to make sure that there was no open area and there is not. The patient mentions to me that his left leg has felt like "lead" for about a week READMISSION 08/16/2020 This is a 50 year old man with type 2 diabetes. He returns to clinic today  with a 1 month history of a blister and callus over the first metatarsal head. This is in exactly the same area as when he was here in 2019. He has a right BKA and a prosthesis from a diabetic foot infection on the  right in 2018. He has standard running shoes on the left foot to match the area in his prosthesis. He has only been covering this area with a Band-Aid has not really been specifically dressing this. ABI in our clinic on the left was 1.16. This is essentially stable from the value in 2019 10/1 the patient has a linear wound over the left first metatarsal head. Again not a lot of callus thick skin around this that I removed with a #3 curette. This is not go deep to bone or muscle it does not look to be infected. 10/8 first metatarsal head. The wound looks like it is closing however this is going to be a difficult area to offload in the future. He has a size 15 shoe and he says his shoes are years old. The inserts in these current shoes are totally useless and I have advised him to replace these. He may need a new pair of shoes and I have he got original prosthesis at [facility] [place] [facility] I have asked him to go back there. 11/9; the patient has not been here in more than a month. Not sure he was healed when he was here the last time. I told him he would have to get new shoes and certainly offload the area over the left first metatarsal head. He has done nothing of the kind. He arrives back in clinic with the same old new balance sneakers. A very large separating callus over the first metatarsal head on the left 11/16; he has purchased new shoes and has at least 2 insoles like I asked. The wound is measuring much smaller. He is using silver alginate he changes the dressing himself 11/30; wound is measurably smaller in length of about a half a centimeter. This is generally very little depth. We have been using silver alginate. He changes the dressing himself every second day. 12/14; wound is  about the same size. Significant undermining medially relative to the size of the wound. We have been using silver alginate. There is not a way to offload this area as he walks with a prosthesis on the right leg 12/28; wound is about the same size. Again he has undermining medially. I am using polymen on this 12/02/2020; the wound looks smaller. Still a senescent edge. We have been using polymen 1/24; the wound is come down a few millimeters. Still a senescent edge. I have been using polymen 2/8; the wound is come down slightly. Still with slight undermining from 12-6 o'clock I have been using polymen partially to get offloading on this area which is opposed by his prosthesis on the other leg. 2/22 quite open improvement he still has a comma shaped wound on the left first metatarsal head. Some depth. Using polymen 3/14; he still has the same problem of a comma shaped area over the left first metatarsal head. This seems to have had more depth today at 0.6 cm. We have been using polymen. The patient changes this himself every second day. I explored the idea of a total contact cast on the left leg however this is his driving foot. He was not enamored with the idea of not being able to drive and states that he does not have anybody else to get him around 3/28; again he comes in with a smaller looking wound however there is depth and undermining requiring debridement. We have been using Hydrofera Blue, changed to silver collagen today. The patient is doing the dressing himself 4/11; patient  presents today for follow-up of his left diabetic foot wound. He denies any issues in the past 2 weeks. He is currently using silver collagen every other day with dressing changes. He does not use diabetic shoes or special inserts to his sneakers. He does not use felt pads for offloading. 4/19; patient presents for his 1 week follow-up. He denies any issues. He reports using Hydrofera Blue and has not been using silver  collagen for dressing changes. He reports minimal drainage to the wound. He overall feels well. 5/3; patient presents for 2-week follow-up. He has been using silver collagen without issues. He has no complaints today and states he overall feels well. 5/17; patient presents for 2-week follow-up. He states that the sleeve is rubbing up against the back of his right leg and causing a wound. He states this happened a week ago and has not been dressing the wound. The plantar wound is stable and he has been using collagen every other day. 5/24; Patient was had a recent hospitalization for cellulitis of his right popliteal fossa. He was admitted and started on IV antibiotics. He does not recall the plantar wound dressed while inpatient. He feels a lot better today. He was discharged on 5/21 on oral antibiotics and has not picked this up until today. He reports using collagen to the plantar wound since discharge. He is keeping the right posterior knee wound covered. He is getting a new sleeve for his prostatic on 5/26. He currently denies signs of infection. 6/1; patient presents for 1 week follow-up. He is still on doxycycline. He has not been dressing the back of the right knee wound. He states he is receiving his new prosthetic sleeve tomorrow. He states he is using silver alginate to the plantar foot wound. He currently denies signs of infection. He states he has not been offloading the left foot wound 6/7; patient presents for 1 week follow-up. He completes his last dose of doxycycline today. He finally received his prosthetic sleeve for the right side and he states it feels much better. He has been using antibiotic ointment to the back of that right knee wound. He has been using silver alginate to the plantar foot wound. He currently denies signs of infection. 6/21; patient presents for 2-week follow-up. He reports no issues with his prosthetic sleeve. He has been using antibiotic ointment to the back  of the right knee. He has been using silver alginate to the plantar foot wound. He has no issues or complaints today. He denies signs of infection. 7/5; patient presents for 2-week follow-up. He reports no issues or complaints today. He continues to use antibiotic ointment to the right posterior knee wound and silver alginate to the left plantar foot wound. He denies signs of infection. 7/19; patient presents for 2-week follow-up. He continues to use antibiotic ointment to the right posterior knee and silver alginate to the left plantar foot wound. He has no issues or complaints today. He denies signs of infection. 8/4; patient presents for 2-week follow-up. He has no issues or complaints today. He denies signs of infection. 8/18; patient presents for 2-week follow-up. He has no issues or concerns today. He has been approved for Apligraf and he would like to have this placed today. He denies signs of infection and reports that his right posterior knee wound is closed. 8/25; patient presents for 1 week follow-up. He had no issues with his first Apligraf placement last week. He did however develop an abscess to the back  of his right knee. He reports tenderness to this area. He denies systemic signs of infection. 9/1; patient presents for 1 week follow-up. He has had no issues with his plantar foot wound. The Apligraf has stayed in place over the past week. A wound culture was done at last clinic visit and a new antibiotic was sent to the pharmacy. Patient has not received this yet. He reports some mild tenderness to the back of his right knee. He denies systemic signs of infection. 9/8; patient presents for 1 week follow-up. He states he took Keflex and had diarrhea as a side effect. He was able to complete the course. He reports continued tenderness to the right posterior knee wound. He has some drainage still. He denies increased warmth or erythema to the area. He denies fever/chills or  nausea/vomiting. He has no issues or complaints today about his plantar foot wound. 9/15; patient presents for follow-up. He reports improvement in pain to the back of his right leg. He does not report any drainage. He had an ultrasound completed on 9/9 that showed a superficial fluid collection concerning for abscess. We attempted to call patient with results with no answer. We did put an urgent referral to general surgery and he reports he has not received a call. He currently denies systemic signs of infection. He reports no issues to his left plantar foot wound. 9/27; patient presents for follow-up. He has no issues or complaints today. He has had no issues with the previous wound to the back of his right leg. He has not made an appointment to see general surgery. He had Apligraf placed at last clinic visit to the plantar foot wound. He currently denies signs of infection. 10/7; patient presents for follow-up. Upon entering the room there is vomit throughout the entire floor. Patient states he does not feel well. I recommended he go to urgent care. I recommended following up next week for wound care. 10/10; patient presents for follow-up. He came last week for his follow-up however he was ill and started vomiting in the exam room. Today he states he feels better however he continues to have vomiting episodes. He states he attempted to go to urgent care/the ED but could not find a parking spot. He subsequently went home. He has no issues or complaints today regarding the foot wound. He never followed up with general surgery for the abscess identified on ultrasound to the back of his right knee. 10/20; patient presents for follow-up. He has no issues or complaints today. Overall he feels well today. 10/27; patient presents for follow-up. He has no issues or complaints today. 11/3; patient presents for follow-up. He has no issues or complaints today. He states he does not want to follow-up with  orthopedics to reevaluate the abscess in the back of his right leg. He denies signs of infection. 11/15; patient presents for follow-up. He has no issues or complaints today. He had been using silver alginate to the left first met head wound. He denies signs of infection. 11/22; patient presents for follow-up. He had Affinity placed at last clinic visit. He has no issues or complaints today. 11/29; patient presents for follow-up. He is in good spirits today. He has no issues or complaints today. 12/6; patient presents for follow-up. He has no issues or complaints today. 12/13; patient presents for follow-up. He denies signs of infection. He has no issues or complaints today. Electronic Signature(s) Signed: 11/04/2021 3:20:51 PM By: [name] Shan DO Entered By: [name] [name]  on 11/04/2021 15:17:29 -------------------------------------------------------------------------------- Physical Exam Details Patient Name: Date of Service: [name] [name] 11/04/2021 2:30 PM Medical Record Number: [id number] Patient Account Number: [id number] Date of Birth/Sex: Treating RN: June 26, 1971 (50 y.o. [name] [name] Primary Care Provider: Ferd [name] Other Clinician: Referring Provider: Treating Provider/Extender: [name] [name] in Treatment: 51 Constitutional respirations regular, non-labored and within target range for patient.. Cardiovascular 2+ dorsalis pedis/posterior tibialis pulses. Psychiatric pleasant and cooperative. Notes Left leg: T the first met head there is an Open wound with granulation tissue and nonviable tissue with circumferential callus. No obvious signs of infection. o Electronic Signature(s) Signed: 11/04/2021 3:20:51 PM By: [name] Shan DO Entered By: [name] [name] on 11/04/2021 15:18:18 -------------------------------------------------------------------------------- Physician Orders Details Patient Name: Date of  Service: [name] [name], [name] 11/04/2021 2:30 PM Medical Record Number: [id number] Patient Account Number: [id number] Date of Birth/Sex: Treating RN: September 05, 1971 (50 y.o. [name] [name] Primary Care Provider: Ferd [name] Other Clinician: Referring Provider: Treating Provider/Extender: [name] [name] in Treatment: 33 Verbal / Phone Orders: No Diagnosis Coding Follow-up Appointments ppointment in 1 week. - with Dr. Heber [place] - Tuesday Return A Cellular or Tissue Based Products Cellular or Tissue Based Product Type: - Affinity #5 Cellular or Tissue Based Product applied to wound bed, secured with steri-strips, cover with Adaptic or Mepitel. (DO NOT REMOVE). Edema Control - Lymphedema / SCD / Other Elevate legs to the level of the heart or above for 30 minutes daily and/or when sitting, a frequency of: Avoid standing for long periods of time. Off-Loading Other: - minimize walking on left foot related to pressure to wound. Aid in wound healing. Wound Treatment Wound #2R - Metatarsal head first Wound Laterality: Left Cleanser: Soap and Water 1 x Per Week/15 Days Discharge Instructions: May shower and wash wound with dial antibacterial soap and water prior to dressing change. Cleanser: Wound Cleanser (Generic) 1 x Per Week/15 Days Discharge Instructions: Cleanse the wound with wound cleanser prior to applying a clean dressing using gauze sponges, not tissue or cotton balls. Peri-Wound Care: Sween Lotion (Moisturizing lotion) 1 x Per Week/15 Days Discharge Instructions: Apply moisturizing lotion to leg Prim Dressing: Affinity ary 1 x Per Week/15 Days Secondary Dressing: Woven Gauze Sponges 2x2 in (Generic) 1 x Per Week/15 Days Discharge Instructions: Apply over primary dressing as directed. Secondary Dressing: Optifoam Non-Adhesive Dressing, 4x4 in (Generic) 1 x Per Week/15 Days Discharge Instructions: cut to make foam donut to help offload Secondary  Dressing: ADAPTIC TOUCH 3x4.25 in 1 x Per Week/15 Days Discharge Instructions: Apply over primary dressing as directed. Secured With: [employer], 4.5x3.1 (in/yd) 1 x Per Week/15 Days Discharge Instructions: Secure with Kerlix as directed. Secured With: 48M Medipore H Soft Cloth Surgical Tape, 2x2 (in/yd) (Generic) 1 x Per Week/15 Days Discharge Instructions: Secure dressing with tape as directed. Electronic Signature(s) Signed: 11/04/2021 3:20:51 PM By: [name] Shan DO Entered By: [name] [name] on 11/04/2021 15:18:31 -------------------------------------------------------------------------------- Problem List Details Patient Name: Date of Service: [name] [name], [name] 11/04/2021 2:30 PM Medical Record Number: [id number] Patient Account Number: [id number] Date of Birth/Sex: Treating RN: Aug 20, 1971 (50 y.o. [name] [name] Primary Care Provider: Ferd [name] Other Clinician: Referring Provider: Treating Provider/Extender: [name] [name] in Treatment: 93 Active Problems ICD-10 Encounter Code Description Active Date MDM Diagnosis [phone] Non-pressure chronic ulcer of other part of left foot with fat layer exposed 08/16/2020 No Yes E11.621 Type 2 diabetes mellitus with foot ulcer 08/16/2020 No Yes E11.42  Type 2 diabetes mellitus with diabetic polyneuropathy 08/16/2020 No Yes Z89.511 Acquired absence of right leg below knee 08/16/2020 No Yes Inactive Problems Resolved Problems ICD-10 Code Description Active Date Resolved Date S81.801D Unspecified open wound, right lower leg, subsequent encounter 04/08/2021 04/08/2021 Electronic Signature(s) Signed: 11/04/2021 3:20:51 PM By: [name] Shan DO Entered By: [name] [name] on 11/04/2021 15:16:23 -------------------------------------------------------------------------------- Progress Note Details Patient Name: Date of Service: [name] [name], [name] 11/04/2021 2:30 PM Medical Record  Number: [id number] Patient Account Number: [id number] Date of Birth/Sex: Treating RN: February 15, 1971 (50 y.o. [name] [name] Primary Care Provider: Ferd [name] Other Clinician: Referring Provider: Treating Provider/Extender: [name] [name] in Treatment: 67 Subjective Chief Complaint Information obtained from Patient patient is here for a review of the wound on his plantar left first metatarsal head and back of his right leg History of Present Illness (HPI) 04/21/18 ADMISSION This is a 50 year old man who is a type II diabetic. In spite of fact his hemoglobin A1c is actually quite good 5.73 months ago he is at a really difficult time over the last 6-7 months. He developed a rapidly progressive infection in the right foot in November 2018 associated with osteomyelitis and necrotizing fasciitis. 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 The stump required and a revision on 11/24/17. The stump revision was advertised as being secondary to falls. I'm not sure the progressive history here however this area is actually closed over. The patient tells me over the same timeframe he has had wounds on the plantar aspect of his left foot. In the ED saw Dr. Sharol [name] in April of this year. 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 It is really not clear that this patient is been dressing this with anything. He came in with the clinic without any specific dressing on the wound areas using his own tennis shoe. He tells me that he only ambulates of course to do a pivot transfer. He does not have his prosthesis for the right leg as of yet and he blames Medicaid for this. He does however use the foot to push himself along in his wheelchair at home. The patient has not had formal arterial studies. ABI in our clinic on the left was 1.13. Patient's past medical history includes type 2 diabetes, fracture of the right fibula. I see that he was treated for  abscesses on his right buttock and chest in 2016. I did not look at the microbiology of this. 04/28/18; patient comes back in the clinic today with the wound pretty much the same as when he came in here last week. Small opening lots of undermining relatively. He tells [place] that nothing is really been on this for 3 days in spite of the fact that we gave him enough to dress this easily especially such a small wound. He says he lives with his mother, she is not capable of assisting with this he is changing the dressings himself. 05/05/18; much better-looking wound today. Smaller. There is some undermining medially however that's only perhaps 2 mm. No surrounding erythema. We've been using silver collagen 05/12/18; small wound on the first metatarsal head. No undermining no surrounding erythema. We've been using silver collagen he has home health coming out to change 05/20/18 the wound on the first metatarsal head looks better. Covered in surface debris/callus nonviable tissue. Required debridement but post debridement this looks quite good. We've been using silver collagen. He has home health 05/27/18;  first metatarsal head wound continues to improve. Just about completely closed. Still a lot of surface debridement callus. We've been using silver collagen 06/06/18; the first metatarsal wound is completely healed over. There is still a lot of callus. I gently removed some of this just to make sure that there was no open area and there is not. The patient mentions to me that his left leg has felt like "lead" for about a week READMISSION 08/16/2020 This is a 50 year old man with type 2 diabetes. He returns to clinic today with a 1 month history of a blister and callus over the first metatarsal head. This is in exactly the same area as when he was here in 2019. 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 He has standard running shoes on the left foot to match the area in  his prosthesis. He has only been covering this area with a Band-Aid has not really been specifically dressing this. ABI in our clinic on the left was 1.16. This is essentially stable from the value in 2019 10/1 the patient has a linear wound over the left first metatarsal head. Again not a lot of callus thick skin around this that I removed with a #3 curette. This is not go deep to bone or muscle it does not look to be infected. 10/8 first metatarsal head. The wound looks like it is closing however this is going to be a difficult area to offload in the future. He has a size 15 shoe and he says his shoes are years old. The inserts in these current shoes are totally useless and I have advised him to replace these. He may need a new pair of shoes and I have he got original prosthesis at [facility] I have asked him to go back there. 11/9; the patient has not been here in more than a month. Not sure he was healed when he was here the last time. I told him he would have to get new shoes and certainly offload the area over the left first metatarsal head. He has done nothing of the kind. He arrives back in clinic with the same old new balance sneakers. A very large separating callus over the first metatarsal head on the left 11/16; he has purchased new shoes and has at least 2 insoles like I asked. The wound is measuring much smaller. He is using silver alginate he changes the dressing himself 11/30; wound is measurably smaller in length of about a half a centimeter. This is generally very little depth. We have been using silver alginate. He changes the dressing himself every second day. 12/14; wound is about the same size. Significant undermining medially relative to the size of the wound. We have been using silver alginate. There is not a way to offload this area as he walks with a prosthesis on the right leg 12/28; wound is about the same size. Again he has undermining medially. I am using  polymen on this 12/02/2020; the wound looks smaller. Still a senescent edge. We have been using polymen 1/24; the wound is come down a few millimeters. Still a senescent edge. I have been using polymen 2/8; the wound is come down slightly. Still with slight undermining from 12-6 o'clock I have been using polymen partially to get offloading on this area which is opposed by his prosthesis on the other leg. 2/22 quite open improvement he still has a comma  shaped wound on the left first metatarsal head. Some depth. Using polymen 3/14; he still has the same problem of a comma shaped area over the left first metatarsal head. This seems to have had more depth today at 0.6 cm. We have been using polymen. The patient changes this himself every second day. I explored the idea of a total contact cast on the left leg however this is his driving foot. He was not enamored with the idea of not being able to drive and states that he does not have anybody else to get him around 3/28; again he comes in with a smaller looking wound however there is depth and undermining requiring debridement. We have been using Hydrofera Blue, changed to silver collagen today. The patient is doing the dressing himself 4/11; patient presents today for follow-up of his left diabetic foot wound. He denies any issues in the past 2 weeks. He is currently using silver collagen every other day with dressing changes. He does not use diabetic shoes or special inserts to his sneakers. He does not use felt pads for offloading. 4/19; patient presents for his 1 week follow-up. He denies any issues. He reports using Hydrofera Blue and has not been using silver collagen for dressing changes. He reports minimal drainage to the wound. He overall feels well. 5/3; patient presents for 2-week follow-up. He has been using silver collagen without issues. He has no complaints today and states he overall feels well. 5/17; patient presents for 2-week  follow-up. He states that the sleeve is rubbing up against the back of his right leg and causing a wound. He states this happened a week ago and has not been dressing the wound. The plantar wound is stable and he has been using collagen every other day. 5/24; Patient was had a recent hospitalization for cellulitis of his right popliteal fossa. He was admitted and started on IV antibiotics. He does not recall the plantar wound dressed while inpatient. He feels a lot better today. He was discharged on 5/21 on oral antibiotics and has not picked this up until today. He reports using collagen to the plantar wound since discharge. He is keeping the right posterior knee wound covered. He is getting a new sleeve for his prostatic on 5/26. He currently denies signs of infection. 6/1; patient presents for 1 week follow-up. He is still on doxycycline. He has not been dressing the back of the right knee wound. He states he is receiving his new prosthetic sleeve tomorrow. He states he is using silver alginate to the plantar foot wound. He currently denies signs of infection. He states he has not been offloading the left foot wound 6/7; patient presents for 1 week follow-up. He completes his last dose of doxycycline today. He finally received his prosthetic sleeve for the right side and he states it feels much better. He has been using antibiotic ointment to the back of that right knee wound. He has been using silver alginate to the plantar foot wound. He currently denies signs of infection. 6/21; patient presents for 2-week follow-up. He reports no issues with his prosthetic sleeve. He has been using antibiotic ointment to the back of the right knee. He has been using silver alginate to the plantar foot wound. He has no issues or complaints today. He denies signs of infection. 7/5; patient presents for 2-week follow-up. He reports no issues or complaints today. He continues to use antibiotic ointment to the right  posterior knee wound and silver alginate  to the left plantar foot wound. He denies signs of infection. 7/19; patient presents for 2-week follow-up. He continues to use antibiotic ointment to the right posterior knee and silver alginate to the left plantar foot wound. He has no issues or complaints today. He denies signs of infection. 8/4; patient presents for 2-week follow-up. He has no issues or complaints today. He denies signs of infection. 8/18; patient presents for 2-week follow-up. He has no issues or concerns today. He has been approved for Apligraf and he would like to have this placed today. He denies signs of infection and reports that his right posterior knee wound is closed. 8/25; patient presents for 1 week follow-up. He had no issues with his first Apligraf placement last week. He did however develop an abscess to the back of his right knee. He reports tenderness to this area. He denies systemic signs of infection. 9/1; patient presents for 1 week follow-up. He has had no issues with his plantar foot wound. The Apligraf has stayed in place over the past week. A wound culture was done at last clinic visit and a new antibiotic was sent to the pharmacy. Patient has not received this yet. He reports some mild tenderness to the back of his right knee. He denies systemic signs of infection. 9/8; patient presents for 1 week follow-up. He states he took Keflex and had diarrhea as a side effect. He was able to complete the course. He reports continued tenderness to the right posterior knee wound. He has some drainage still. He denies increased warmth or erythema to the area. He denies fever/chills or nausea/vomiting. He has no issues or complaints today about his plantar foot wound. 9/15; patient presents for follow-up. He reports improvement in pain to the back of his right leg. He does not report any drainage. He had an ultrasound completed on 9/9 that showed a superficial fluid collection  concerning for abscess. We attempted to call patient with results with no answer. We did put an urgent referral to general surgery and he reports he has not received a call. He currently denies systemic signs of infection. He reports no issues to his left plantar foot wound. 9/27; patient presents for follow-up. He has no issues or complaints today. He has had no issues with the previous wound to the back of his right leg. He has not made an appointment to see general surgery. He had Apligraf placed at last clinic visit to the plantar foot wound. He currently denies signs of infection. 10/7; patient presents for follow-up. Upon entering the room there is vomit throughout the entire floor. Patient states he does not feel well. I recommended he go to urgent care. I recommended following up next week for wound care. 10/10; patient presents for follow-up. He came last week for his follow-up however he was ill and started vomiting in the exam room. Today he states he feels better however he continues to have vomiting episodes. He states he attempted to go to urgent care/the ED but could not find a parking spot. He subsequently went home. He has no issues or complaints today regarding the foot wound. He never followed up with general surgery for the abscess identified on ultrasound to the back of his right knee. 10/20; patient presents for follow-up. He has no issues or complaints today. Overall he feels well today. 10/27; patient presents for follow-up. He has no issues or complaints today. 11/3; patient presents for follow-up. He has no issues or complaints today. He  states he does not want to follow-up with orthopedics to reevaluate the abscess in the back of his right leg. He denies signs of infection. 11/15; patient presents for follow-up. He has no issues or complaints today. He had been using silver alginate to the left first met head wound. He denies signs of infection. 11/22; patient presents for  follow-up. He had Affinity placed at last clinic visit. He has no issues or complaints today. 11/29; patient presents for follow-up. He is in good spirits today. He has no issues or complaints today. 12/6; patient presents for follow-up. He has no issues or complaints today. 12/13; patient presents for follow-up. He denies signs of infection. He has no issues or complaints today. Patient History Information obtained from Patient. Family History Diabetes - Maternal Grandparents,Mother, Heart Disease - Father, Hypertension - Father, No family history of Cancer, Hereditary Spherocytosis, Kidney Disease, Lung Disease, Seizures, Stroke, Thyroid Problems, Tuberculosis. Social History Current every day smoker, Marital Status - Single, Alcohol Use - Never, Drug Use - No History, Caffeine Use - Never. Medical History Eyes Denies history of Cataracts, Glaucoma, Optic Neuritis Ear/Nose/Mouth/Throat Denies history of Chronic sinus problems/congestion, Middle ear problems Hematologic/Lymphatic Patient has history of Anemia Denies history of Hemophilia, Human Immunodeficiency Virus, Lymphedema, Sickle Cell Disease Respiratory Denies history of Aspiration, Asthma, Chronic Obstructive Pulmonary Disease (COPD), Pneumothorax, Sleep Apnea, Tuberculosis Cardiovascular Patient has history of Hypertension, Peripheral Venous Disease Denies history of Angina, Arrhythmia, Congestive Heart Failure, Coronary Artery Disease, Deep Vein Thrombosis, Hypotension, Myocardial Infarction, Peripheral Arterial Disease, Phlebitis, Vasculitis Gastrointestinal Denies history of Cirrhosis , Colitis, Crohnoos, Hepatitis A, Hepatitis B, Hepatitis C Endocrine Patient has history of Type II Diabetes - oral meds Denies history of Type I Diabetes Genitourinary Denies history of End Stage Renal Disease Immunological Denies history of Lupus Erythematosus, Raynaudoos, Scleroderma Integumentary (Skin) Denies history of History of  Burn Musculoskeletal Denies history of Gout, Rheumatoid Arthritis, Osteoarthritis, Osteomyelitis Neurologic Denies history of Dementia, Neuropathy, Quadriplegia, Paraplegia, Seizure Disorder Oncologic Denies history of Received Chemotherapy, Received Radiation Psychiatric Denies history of Anorexia/bulimia, Confinement Anxiety Hospitalization/Surgery History - fever 105, sepsis cellulitis right popliteal fossa 04/09/2021. Medical A Surgical History Notes [name] Constitutional Symptoms (General Health) falls , leukocytosis Respiratory During waking hours and catches himself not breathing, "gasp for air". Saw Dr Wynonia Lawman, he couldn't find a reason Objective Constitutional respirations regular, non-labored and within target range for patient.. Vitals Time Taken: 2:32 PM, Height: 77 in, Weight: 232 lbs, BMI: 27.5, Temperature: 97.9 F, Pulse: 105 bpm, Respiratory Rate: 18 breaths/min, Blood Pressure: 167/94 mmHg. Cardiovascular 2+ dorsalis pedis/posterior tibialis pulses. Psychiatric pleasant and cooperative. General Notes: Left leg: T the first met head there is an Open wound with granulation tissue and nonviable tissue with circumferential callus. No obvious signs o of infection. Integumentary (Hair, Skin) Wound #2R status is Open. Original cause of wound was Blister. The date acquired was: 06/23/2020. The wound has been in treatment 63 weeks. The wound is located on the Left Metatarsal head first. The wound measures 0.8cm length x 0.3cm width x 0.5cm depth; 0.188cm^2 area and 0.094cm^3 volume. There is Fat Layer (Subcutaneous Tissue) exposed. There is undermining starting at 12:00 and ending at 12:00 with a maximum distance of 0.7cm. There is a medium amount of serosanguineous drainage noted. The wound margin is thickened. There is large (67-100%) red granulation within the wound bed. There is a small (1- 33%) amount of necrotic tissue within the wound bed including Adherent  Slough. 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 No signs of infection. I debrided nonviable tissue and callus. I applied Affinity #5 in standard fashion. The patient has a hard time offloading this area due to his prosthesis to the right lower extremity. Procedures Wound #2R Pre-procedure diagnosis of Wound #2R is a Diabetic Wound/Ulcer of the Lower Extremity located on the Left Metatarsal head first .Severity of Tissue Pre Debridement is: Fat layer exposed. There was a Excisional Skin/Subcutaneous Tissue Debridement with a total area of 1 sq cm performed by [name] Shan, DO. With the following instrument(s): Blade, Curette, and Forceps to remove Non-Viable tissue/material. Material removed includes Callus, Subcutaneous Tissue, and Skin: Epidermis after achieving pain control using Lidocaine 4% T opical Solution. No specimens were taken. A time out was conducted at 15:55, prior to the start of the procedure. A Minimum amount of bleeding was controlled with Pressure. The procedure was tolerated well with a pain level of 3 throughout and a pain level of 3 following the procedure. Post Debridement Measurements: 1cm length x 0.5cm width x 0.5cm depth; 0.196cm^3 volume. Character of Wound/Ulcer Post Debridement is improved. Severity of Tissue Post Debridement is: Fat layer exposed. Post procedure Diagnosis Wound #2R: Same as Pre-Procedure Pre-procedure diagnosis of Wound #2R is a Diabetic Wound/Ulcer of the Lower Extremity located on the Left Metatarsal head first. A skin graft procedure using a bioengineered skin substitute/cellular or tissue based product was performed by [name] Shan, DO with the following instrument(s): Forceps. Affinity was applied and  secured with Steri-Strips. 3 sq cm of product was utilized and 0 sq cm was wasted. Post Application, adaptic. gauze was applied. A Time Out was conducted at 15:02, prior to the start of the procedure. The procedure was tolerated well with a pain level of 0 throughout and a pain level of 0 following the procedure. Post procedure Diagnosis Wound #2R: Same as Pre-Procedure . Plan Follow-up Appointments: Return Appointment in 1 week. - with Dr. Heber [place] - Tuesday Cellular or Tissue Based Products: Cellular or Tissue Based Product Type: - Affinity #5 Cellular or Tissue Based Product applied to wound bed, secured with steri-strips, cover with Adaptic or Mepitel. (DO NOT REMOVE). Edema Control - Lymphedema / SCD / Other: Elevate legs to the level of the heart or above for 30 minutes daily and/or when sitting, a frequency of: Avoid standing for long periods of time. Off-Loading: Other: - minimize walking on left foot related to pressure to wound. Aid in wound healing. WOUND #2R: - Metatarsal head first Wound Laterality: Left Cleanser: Soap and Water 1 x Per Week/15 Days Discharge Instructions: May shower and wash wound with dial antibacterial soap and water prior to dressing change. Cleanser: Wound Cleanser (Generic) 1 x Per Week/15 Days Discharge Instructions: Cleanse the wound with wound cleanser prior to applying a clean dressing using gauze sponges, not tissue or cotton balls. Peri-Wound Care: Sween Lotion (Moisturizing lotion) 1 x Per Week/15 Days Discharge Instructions: Apply moisturizing lotion to leg Prim Dressing: Affinity 1 x Per Week/15 Days ary Secondary Dressing: Woven Gauze Sponges 2x2 in (Generic) 1 x Per Week/15 Days Discharge Instructions: Apply over primary dressing as directed. Secondary Dressing: Optifoam Non-Adhesive Dressing, 4x4 in (Generic) 1 x Per Week/15 Days Discharge Instructions: cut to make foam donut to help offload Secondary Dressing: ADAPTIC TOUCH 3x4.25 in 1 x  Per  Week/15 Days Discharge Instructions: Apply over primary dressing as directed. Secured With: [employer], 4.5x3.1 (in/yd) 1 x Per Week/15 Days Discharge Instructions: Secure with Kerlix as directed. Secured With: 31M Medipore H Soft Cloth Surgical T ape, 2x2 (in/yd) (Generic) 1 x Per Week/15 Days Discharge Instructions: Secure dressing with tape as directed. 1. In office sharp debridement 2. Affinity #5 placed in standard fashion 3. Follow-up in 1 week Electronic Signature(s) Signed: 11/04/2021 3:20:51 PM By: [name] Shan DO Entered By: [name] [name] on 11/04/2021 15:20:32 -------------------------------------------------------------------------------- HxROS Details Patient Name: Date of Service: [name] [name], [name] 11/04/2021 2:30 PM Medical Record Number: [id number] Patient Account Number: [id number] Date of Birth/Sex: Treating RN: [id number] (50 y.o. [name] [name] Primary Care Provider: Ferd [name] Other Clinician: Referring Provider: Treating Provider/Extender: [name] [name] in Treatment: 59 Information Obtained From Patient Constitutional Symptoms (General Health) Medical History: Past Medical History Notes: falls , leukocytosis Eyes Medical History: Negative for: Cataracts; Glaucoma; Optic Neuritis Ear/Nose/Mouth/Throat Medical History: Negative for: Chronic sinus problems/congestion; Middle ear problems Hematologic/Lymphatic Medical History: Positive for: Anemia Negative for: Hemophilia; Human Immunodeficiency Virus; Lymphedema; Sickle Cell Disease Respiratory Medical History: Negative for: Aspiration; Asthma; Chronic Obstructive Pulmonary Disease (COPD); Pneumothorax; Sleep Apnea; Tuberculosis Past Medical History Notes: During waking hours and catches himself not breathing, "gasp for air". Saw Dr Wynonia Lawman, he couldn't find a reason Cardiovascular Medical History: Positive for: Hypertension; Peripheral Venous  Disease Negative for: Angina; Arrhythmia; Congestive Heart Failure; Coronary Artery Disease; Deep Vein Thrombosis; Hypotension; Myocardial Infarction; Peripheral Arterial Disease; Phlebitis; Vasculitis Gastrointestinal Medical History: Negative for: Cirrhosis ; Colitis; Crohns; Hepatitis A; Hepatitis B; Hepatitis C Endocrine Medical History: Positive for: Type II Diabetes - oral meds Negative for: Type I Diabetes Time with diabetes: 2014 Treated with: Oral agents Blood sugar tested every day: Yes Tested : Blood sugar testing results: Breakfast: 91 Genitourinary Medical History: Negative for: End Stage Renal Disease Immunological Medical History: Negative for: Lupus Erythematosus; Raynauds; Scleroderma Integumentary (Skin) Medical History: Negative for: History of Burn Musculoskeletal Medical History: Negative for: Gout; Rheumatoid Arthritis; Osteoarthritis; Osteomyelitis Neurologic Medical History: Negative for: Dementia; Neuropathy; Quadriplegia; Paraplegia; Seizure Disorder Oncologic Medical History: Negative for: Received Chemotherapy; Received Radiation Psychiatric Medical History: Negative for: Anorexia/bulimia; Confinement Anxiety Immunizations Pneumococcal Vaccine: Received Pneumococcal Vaccination: No Implantable Devices No devices added Hospitalization / Surgery History Type of Hospitalization/Surgery fever 105, sepsis cellulitis right popliteal fossa 04/09/2021 Family and Social History Cancer: No; Diabetes: Yes - Maternal Grandparents,Mother; Heart Disease: Yes - Father; Hereditary Spherocytosis: No; Hypertension: Yes - Father; Kidney Disease: No; Lung Disease: No; Seizures: No; Stroke: No; Thyroid Problems: No; Tuberculosis: No; Current every day smoker; Marital Status - Single; Alcohol Use: Never; Drug Use: No History; Caffeine Use: Never; Financial Concerns: No; Food, Clothing or Shelter Needs: No; Support System Lacking: No; Transportation Concerns:  No Electronic Signature(s) Signed: 11/04/2021 3:20:51 PM By: [name] Shan DO Signed: 11/04/2021 5:44:31 PM By: [name] Gouty RN, BSN Entered By: [name] [name] on 11/04/2021 15:17:39 -------------------------------------------------------------------------------- [place] Details Patient Name: Date of Service: [name] [name], [name] 11/04/2021 Medical Record Number: [id number] Patient Account Number: [id number] Date of Birth/Sex: Treating RN: 1970-11-25 (51 y.o. 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

## 2021-11-04 NOTE — Progress Notes (Signed)
DEMITRIOS, [name] ([id number]) Visit Report for 11/04/2021 Arrival Information Details Patient Name: Date of Service: [name] [name] 11/04/2021 2:30 PM Medical Record Number: [id number] Patient Account Number: [id number] Date of Birth/Sex: Treating RN: October 19, 1971 (50 y.o. [name] [name], [name] [name] Primary Care Girtha Kilgore: [name] Other Clinician: Referring Ociel Retherford: Treating Ronnae Kaser/Extender: Delano Metz in Treatment: 57 Visit Information History Since Last Visit Added or deleted any medications: No Patient Arrived: Ambulatory Any new allergies or adverse reactions: No Arrival Time: 14:33 Had a fall or experienced change in Yes Accompanied By: self activities of daily living that may affect Transfer Assistance: None risk of falls: Patient Identification Verified: Yes Signs or symptoms of abuse/neglect since last visito No Secondary Verification Process Completed: Yes Hospitalized since last visit: No Patient Requires Transmission-Based Precautions: No Implantable device outside of the clinic excluding No Patient Has Alerts: No cellular tissue based products placed in the center since last visit: Has Dressing in Place as Prescribed: Yes Pain Present Now: Yes Electronic Signature(s) Signed: 11/04/2021 5:44:31 PM By: Baruch Gouty RN, BSN Entered By: Baruch Gouty on 11/04/2021 14:34:04 -------------------------------------------------------------------------------- Encounter Discharge Information Details Patient Name: Date of Service: [name] [name] ND, [name] 11/04/2021 2:30 PM Medical Record Number: [id number] Patient Account Number: [id number] Date of Birth/Sex: Treating RN: [birth year] (50 y.o. [name] [name] Primary Care Lydiana Milley: [name] Other Clinician: Referring Akhilesh Sassone: Treating Shaunice Levitan/Extender: Delano Metz in Treatment: 67 Encounter Discharge Information Items Post Procedure  Vitals Discharge Condition: Stable Temperature (F): 97.9 Ambulatory Status: Ambulatory Pulse (bpm): 105 Discharge Destination: Home Respiratory Rate (breaths/min): 18 Transportation: Private Auto Blood Pressure (mmHg): 167/94 Accompanied By: self Schedule Follow-up Appointment: Yes Clinical Summary of Care: Patient Declined Electronic Signature(s) Signed: 11/04/2021 5:44:31 PM By: Baruch Gouty RN, BSN Entered By: Baruch Gouty on 11/04/2021 15:18:22 -------------------------------------------------------------------------------- Lower Extremity Assessment Details Patient Name: Date of Service: [name] ND, [name] 11/04/2021 2:30 PM Medical Record Number: [id number] Patient Account Number: [id number] Date of Birth/Sex: Treating RN: 1971-01-23 (50 y.o. [name] [name] Primary Care Marlyss Cissell: [name] Other Clinician: Referring Haely Leyland: Treating Dontavis Tschantz/Extender: Delano Metz in Treatment: 41 Edema Assessment Assessed: [name]: No] [Right: No] Edema: [Left: Ye] [Right: s] Calf Left: Right: Point of Measurement: 50 cm From Medial Instep 39 cm Ankle Left: Right: Point of Measurement: 11 cm From Medial Instep 25 cm Vascular Assessment Pulses: Dorsalis Pedis Palpable: [Left:Yes] Electronic Signature(s) Signed: 11/04/2021 5:44:31 PM By: Baruch Gouty RN, BSN Entered By: Baruch Gouty on 11/04/2021 14:41:04 -------------------------------------------------------------------------------- Multi Wound Chart Details Patient Name: Date of Service: [name] [name] ND, [name] 11/04/2021 2:30 PM Medical Record Number: [id number] Patient Account Number: [id number] Date of Birth/Sex: Treating RN: 04-Nov-1971 (50 y.o. [name] [name] Primary Care [name] [name]: [name] Other Clinician: Referring Kymberli Wiegand: Treating [name] [name]/Extender: Delano Metz in Treatment: 1 Vital Signs Height(in): 77 Pulse(bpm):  105 Weight(lbs): 232 Blood Pressure(mmHg): 167/94 Body Mass Index(BMI): 28 Temperature(F): 97.9 Respiratory Rate(breaths/min): 18 Photos: [2R:No Photos Left Metatarsal head first] [N/A:N/A N/A] Wound Location: [2R:Blister] [N/A:N/A] Wounding Event: [2R:Diabetic Wound/Ulcer of the Lower] [N/A:N/A] Primary Etiology: [2R:Extremity Anemia, Hypertension, Peripheral] [N/A:N/A] Comorbid History: [2R:Venous Disease, Type II Diabetes 06/23/2020] [N/A:N/A] Date Acquired: [2R:63] [N/A:N/A] Weeks of Treatment: [2R:Open] [N/A:N/A] Wound Status: [2R:Yes] [N/A:N/A] Wound Recurrence: [2R:0.8x0.3x0.5] [N/A:N/A] Measurements L x W x D (cm) [2R:0.188] [N/A:N/A] A (cm) : rea [6Z:1.245] [N/A:N/A] Volume (cm) : [2R:-506.50%] [N/A:N/A] % Reduction in A rea: [2R:-3033.30%] [N/A:N/A] % Reduction in Volume: [2R:12] Starting Position 1 (o'clock): [2R:12] Ending  Position 1 (o'clock): [2R:0.7] Maximum Distance 1 (cm): [2R:Yes] [N/A:N/A] Undermining: [2R:Grade 2] [N/A:N/A] Classification: [2R:Medium] [N/A:N/A] Exudate A mount: [2R:Serosanguineous] [N/A:N/A] Exudate Type: [2R:red, brown] [N/A:N/A] Exudate Color: [2R:Thickened] [N/A:N/A] Wound Margin: [2R:Large (67-100%)] [N/A:N/A] Granulation A mount: [2R:Red] [N/A:N/A] Granulation Quality: [2R:Small (1-33%)] [N/A:N/A] Necrotic A mount: [2R:Fat Layer (Subcutaneous Tissue): Yes N/A] Exposed Structures: [2R:Fascia: No Tendon: No Muscle: No Joint: No Bone: No Small (1-33%)] [N/A:N/A] Epithelialization: [2R:Debridement - Excisional] [N/A:N/A] Debridement: Pre-procedure Verification/Time Out 15:55 [N/A:N/A] Taken: [2R:Lidocaine 4% T opical Solution] [N/A:N/A] Pain Control: [2R:Callus, Subcutaneous] [N/A:N/A] Tissue Debrided: [2R:Skin/Subcutaneous Tissue] [N/A:N/A] Level: [2R:1] [N/A:N/A] Debridement A (sq cm): [2R:rea Blade, Curette, Forceps] [N/A:N/A] Instrument: [2R:Minimum] [N/A:N/A] Bleeding: [2R:Pressure] [N/A:N/A] Hemostasis A chieved: [2R:3]  [N/A:N/A] Procedural Pain: [2R:3] [N/A:N/A] Post Procedural Pain: [2R:Procedure was tolerated well] [N/A:N/A] Debridement Treatment Response: [2R:1x0.5x0.5] [N/A:N/A] Post Debridement Measurements L x W x D (cm) [2R:0.196] [N/A:N/A] Post Debridement Volume: (cm) [2R:Cellular or Tissue Based Product] [N/A:N/A] Procedures Performed: [2R:Debridement] Treatment Notes Electronic Signature(s) Signed: 11/04/2021 3:20:51 PM By: Kalman Shan DO Signed: 11/04/2021 5:44:31 PM By: Baruch Gouty RN, BSN Entered By: Kalman Shan on 11/04/2021 15:16:29 -------------------------------------------------------------------------------- [place] Details Patient Name: Date of Service: [name] [name] ND, [name] 11/04/2021 2:30 PM Medical Record Number: [id number] Patient Account Number: [id number] Date of Birth/Sex: Treating RN: 1971-08-28 (50 y.o. [name] [name] Primary Care Almer Bushey: [name] Other Clinician: Referring Gale Hulse: Treating Xan Ingraham/Extender: Delano Metz in Treatment: 75 Multidisciplinary Care Plan reviewed with physician Active Inactive Abuse / Safety / Falls / Self Care Management Nursing Diagnoses: History of Falls Goals: Patient/caregiver will verbalize/demonstrate measures taken to prevent injury and/or falls Date Initiated: 11/04/2021 Target Resolution Date: 12/02/2021 Goal Status: Active Interventions: Assess fall risk on admission and as needed Assess impairment of mobility on admission and as needed per policy Notes: Wound/Skin Impairment Nursing Diagnoses: Impaired tissue integrity Goals: Patient/caregiver will verbalize understanding of skin care regimen Date Initiated: 11/19/2020 Target Resolution Date: 12/05/2021 Goal Status: Active Ulcer/skin breakdown will have a volume reduction of 30% by week 4 Date Initiated: 08/16/2020 Date Inactivated: 03/03/2021 Target Resolution Date: 03/10/2021 Goal  Status: Met Interventions: Provide education on ulcer and skin care Notes: 07/24/21: Wound care regimen ongoing. Electronic Signature(s) Signed: 11/04/2021 5:44:31 PM By: Baruch Gouty RN, BSN Entered By: Baruch Gouty on 11/04/2021 14:46:25 -------------------------------------------------------------------------------- Pain Assessment Details Patient Name: Date of Service: [name] [name] ND, [name] 11/04/2021 2:30 PM Medical Record Number: [id number] Patient Account Number: [id number] Date of Birth/Sex: Treating RN: 1971/04/14 (50 y.o. [name] [name] Primary Care Ammi Hutt: [name] Other Clinician: Referring Jaquaveon Bilal: Treating Rinoa Garramone/Extender: Delano Metz in Treatment: 96 Active Problems Location of Pain Severity and Description of Pain Patient Has Paino Yes Site Locations Pain Location: Pain in Ulcers With Dressing Change: Yes Duration of the Pain. Constant / Intermittento Intermittent Rate the pain. Current Pain Level: 3 Worst Pain Level: 6 Least Pain Level: 0 Character of Pain Describe the Pain: Aching Pain Management and Medication Current Pain Management: Other: time Is the Current Pain Management Adequate: Adequate How does your wound impact your activities of daily livingo Sleep: No Bathing: No Appetite: No Relationship With Others: No Bladder Continence: No Emotions: No Bowel Continence: No Work: No Toileting: No Drive: No Dressing: No Hobbies: No Electronic Signature(s) Signed: 11/04/2021 5:44:31 PM By: Baruch Gouty RN, BSN Entered By: Baruch Gouty on 11/04/2021 14:40:34 -------------------------------------------------------------------------------- Patient/Caregiver Education Details Patient Name: Date of Service: [name] [name] 12/13/2022andnbsp2:30 PM Medical Record Number: [id number] Patient Account Number:  [id number] Date of Birth/Gender: Treating RN: July 07, 1971 (50 y.o. [name] [name] Primary Care Physician: [name] Other Clinician: Referring Physician: Treating Physician/Extender: Delano Metz in Treatment: 4 Education Assessment Education Provided To: Patient Education Topics Provided Offloading: Methods: Explain/Verbal Responses: Reinforcements needed, State content correctly Wound/Skin Impairment: Methods: Explain/Verbal Responses: Reinforcements needed, State content correctly Electronic Signature(s) Signed: 11/04/2021 5:44:31 PM By: Baruch Gouty RN, BSN Entered By: Baruch Gouty on 11/04/2021 14:45:43 -------------------------------------------------------------------------------- Wound Assessment Details Patient Name: Date of Service: [name] [name] ND, [name] 11/04/2021 2:30 PM Medical Record Number: [id number] Patient Account Number: [id number] Date of Birth/Sex: Treating RN: 26-Aug-1971 (50 y.o. [name] [name] Primary Care Mavis Gravelle: [name] Other Clinician: Referring Berry Gallacher: Treating Lassie Demorest/Extender: Delano Metz in Treatment: 27 Wound Status Wound Number: 2R Primary Diabetic Wound/Ulcer of the Lower Extremity Etiology: Wound Location: Left Metatarsal head first Wound Status: Open Wounding Event: Blister Comorbid Anemia, Hypertension, Peripheral Venous Disease, Type II Date Acquired: 06/23/2020 History: Diabetes Weeks Of Treatment: 63 Clustered Wound: No Wound Measurements Length: (cm) 0.8 Width: (cm) 0.3 Depth: (cm) 0.5 Area: (cm) 0.188 Volume: (cm) 0.094 % Reduction in Area: -506.5% % Reduction in Volume: -3033.3% Epithelialization: Small (1-33%) Undermining: Yes Starting Position (o'clock): 12 Ending Position (o'clock): 12 Maximum Distance: (cm) 0.7 Wound Description Classification: Grade 2 Wound Margin: Thickened Exudate Amount: Medium Exudate Type: Serosanguineous Exudate Color: red, brown Wound Bed Granulation Amount: Large (67-100%)  Exposed Structure Granulation Quality: Red Fascia Exposed: No Necrotic Amount: Small (1-33%) Fat Layer (Subcutaneous Tissue) Exposed: Yes Necrotic Quality: Adherent Slough Tendon Exposed: No Muscle Exposed: No Joint Exposed: No Bone Exposed: No Treatment Notes Wound #2R (Metatarsal head first) Wound Laterality: Left Cleanser Soap and Water Discharge Instruction: May shower and wash wound with dial antibacterial soap and water prior to dressing change. Wound Cleanser Discharge Instruction: Cleanse the wound with wound cleanser prior to applying a clean dressing using gauze sponges, not tissue or cotton balls. Peri-Wound Care Sween Lotion (Moisturizing lotion) Discharge Instruction: Apply moisturizing lotion to leg Topical Primary Dressing Affinity Secondary Dressing Woven Gauze Sponges 2x2 in Discharge Instruction: Apply over primary dressing as directed. Optifoam Non-Adhesive Dressing, 4x4 in Discharge Instruction: cut to make foam donut to help offload ADAPTIC TOUCH 3x4.25 in Discharge Instruction: Apply over primary dressing as directed. Secured With [employer], 4.5x3.1 (in/yd) Discharge Instruction: Secure with Kerlix as directed. 26M Medipore H Soft Cloth Surgical T ape, 2x2 (in/yd) Discharge Instruction: Secure dressing with tape as directed. Compression Wrap Compression Stockings Add-Ons Electronic Signature(s) Signed: 11/04/2021 5:44:31 PM By: Baruch Gouty RN, BSN Signed: 11/04/2021 5:44:31 PM By: Baruch Gouty RN, BSN Entered By: Baruch Gouty on 11/04/2021 14:42:35 -------------------------------------------------------------------------------- [place] Details Patient Name: Date of Service: [name] [name] ND, [name] 11/04/2021 2:30 PM Medical Record Number: [id number] Patient Account Number: [id number] Date of Birth/Sex: Treating RN: [birth year] (50 y.o. [name] [name] Primary Care Eleyna Brugh: [name] Other Clinician: Referring  Cadarius Nevares: Treating Cherrise Occhipinti/Extender: Delano Metz in Treatment: 62 Vital Signs Time Taken: 14:32 Temperature (F): 97.9 Height (in): 77 Pulse (bpm): 105 Weight (lbs): 232 Respiratory Rate (breaths/min): 18 Body Mass Index (BMI): 27.5 Blood Pressure (mmHg): 167/94 Reference Range: 80 - 120 mg / dl Electronic Signature(s) Signed: 11/04/2021 5:44:31 PM By: Baruch Gouty RN, BSN Entered By: Baruch Gouty on 11/04/2021 14:36:48

## 2021-11-04 NOTE — Progress Notes (Signed)
KATHY, WARES ([id number]) Visit Report for 11/04/2021 Fall Risk Assessment Details Patient Name: Date of Service: [name] [name] 11/04/2021 2:30 PM Medical Record Number: [id number] Patient Account Number: [id number] Date of Birth/Sex: Treating RN: 1971-05-05 (50 y.o. [name] Primary Care Lavayah Vita: [name] Other Clinician: Referring Neyland Pettengill: Treating Danyiel Crespin/Extender: Delano Metz in Treatment: 8 Fall Risk Assessment Items Have you had 2 or more falls in the last 12 monthso 0 No Have you had any fall that resulted in injury in the last 12 monthso 0 No FALLS RISK SCREEN History of falling - immediate or within 3 months 25 Yes Secondary diagnosis (Do you have 2 or more medical diagnoseso) 15 Yes Ambulatory aid None/bed rest/wheelchair/nurse 0 Yes Crutches/cane/walker 0 No Furniture 0 No Intravenous therapy Access/Saline/Heparin Lock 0 No Gait/Transferring Normal/ bed rest/ wheelchair 0 Yes Weak (short steps with or without shuffle, stooped but able to lift head while walking, may seek 0 No support from furniture) Impaired (short steps with shuffle, may have difficulty arising from chair, head down, impaired 0 No balance) Mental Status Oriented to own ability 0 Yes Electronic Signature(s) Signed: 11/04/2021 5:44:31 PM By: Baruch Gouty RN, BSN Entered By: Baruch Gouty on 11/04/2021 14:44:57

## 2021-11-05 ENCOUNTER — Encounter (HOSPITAL_COMMUNITY): Payer: Medicare Other

## 2021-11-11 ENCOUNTER — Other Ambulatory Visit: Payer: Self-pay

## 2021-11-11 ENCOUNTER — Encounter (HOSPITAL_BASED_OUTPATIENT_CLINIC_OR_DEPARTMENT_OTHER): Payer: Medicare Other | Admitting: Internal Medicine

## 2021-11-11 DIAGNOSIS — L97522 Non-pressure chronic ulcer of other part of left foot with fat layer exposed: Secondary | ICD-10-CM | POA: Diagnosis not present

## 2021-11-11 DIAGNOSIS — E11621 Type 2 diabetes mellitus with foot ulcer: Secondary | ICD-10-CM | POA: Diagnosis not present

## 2021-11-11 NOTE — Progress Notes (Signed)
[name] [name], [name] [name] ([id number]) Visit Report for 11/11/2021 Arrival Information Details Patient Name: Date of Service: [name] [name] 11/11/2021 12:30 PM Medical Record Number: [id number] Patient Account Number: [id number] Date of Birth/Sex: Treating RN: [birth year] (50 y.o. [name] [name], [name] [name] Primary Care Giulio Bertino: [name] Other Clinician: Referring Joia Doyle: Treating Valentino Saavedra/Extender: Delano Metz in Treatment: 16 Visit Information History Since Last Visit Added or deleted any medications: No Patient Arrived: Ambulatory Any new allergies or adverse reactions: No Arrival Time: 12:38 Had a fall or experienced change in No Accompanied By: self activities of daily living that may affect Transfer Assistance: None risk of falls: Patient Identification Verified: Yes Signs or symptoms of abuse/neglect since last visito No Secondary Verification Process Completed: Yes Hospitalized since last visit: No Patient Requires Transmission-Based Precautions: No Implantable device outside of the clinic excluding No Patient Has Alerts: No cellular tissue based products placed in the center since last visit: Has Dressing in Place as Prescribed: Yes Pain Present Now: Yes Electronic Signature(s) Signed: 11/11/2021 5:11:02 PM By: Baruch Gouty RN, BSN Entered By: Baruch Gouty on 11/11/2021 12:41:14 -------------------------------------------------------------------------------- Encounter Discharge Information Details Patient Name: Date of Service: [name] [name] [name], [name] 11/11/2021 12:30 PM Medical Record Number: [id number] Patient Account Number: [id number] Date of Birth/Sex: Treating RN: 1970/12/11 (50 y.o. [name] [name] Primary Care Jamiyah Dingley: [name] Other Clinician: Referring Senai Ramnath: Treating Genese Quebedeaux/Extender: Delano Metz in Treatment: 56 Encounter Discharge Information Items Post Procedure  Vitals Discharge Condition: Stable Temperature (F): 97.9 Ambulatory Status: Ambulatory Pulse (bpm): 91 Discharge Destination: Home Respiratory Rate (breaths/min): 18 Transportation: Private Auto Blood Pressure (mmHg): 149/84 Accompanied By: self Schedule Follow-up Appointment: Yes Clinical Summary of Care: Patient Declined Electronic Signature(s) Signed: 11/11/2021 5:11:02 PM By: Baruch Gouty RN, BSN Entered By: Baruch Gouty on 11/11/2021 13:16:36 -------------------------------------------------------------------------------- Lower Extremity Assessment Details Patient Name: Date of Service: [name] [name], [name] Brandy [name] 11/11/2021 12:30 PM Medical Record Number: [id number] Patient Account Number: [id number] Date of Birth/Sex: Treating RN: [birth year] (50 y.o. [name] [name] Primary Care Chana Lindstrom: [name] Other Clinician: Referring Corleen Otwell: Treating Koji Niehoff/Extender: Delano Metz in Treatment: 64 Edema Assessment Assessed: [name]: No] [name]: No] Edema: [Left: Ye] [Right: s] Calf Left: Right: Point of Measurement: 50 cm From Medial Instep 40 cm Ankle Left: Right: Point of Measurement: 11 cm From Medial Instep 26 cm Vascular Assessment Pulses: Dorsalis Pedis Palpable: [Left:Yes] Electronic Signature(s) Signed: 11/11/2021 5:11:02 PM By: Baruch Gouty RN, BSN Entered By: Baruch Gouty on 11/11/2021 12:50:22 -------------------------------------------------------------------------------- Multi Wound Chart Details Patient Name: Date of Service: [name] [name] [name], [name] 11/11/2021 12:30 PM Medical Record Number: [id number] Patient Account Number: [id number] Date of Birth/Sex: Treating RN: Mar 29, 1971 (50 y.o. [name] [name] Primary Care Ifeanyichukwu Wickham: [name] Other Clinician: Referring Lovelle Deitrick: Treating Briley Bumgarner/Extender: Delano Metz in Treatment: 53 Vital Signs Height(in): 77 Capillary Blood  Glucose(mg/dl): 273 Weight(lbs): 232 Pulse(bpm): 91 Body Mass Index(BMI): 28 Blood Pressure(mmHg): 149/84 Temperature(F): 97.9 Respiratory Rate(breaths/min): 18 Photos: [2R:Left Metatarsal head first] [N/A:N/A N/A] Wound Location: [2R:Blister] [N/A:N/A] Wounding Event: [2R:Diabetic Wound/Ulcer of the Lower] [N/A:N/A] Primary Etiology: [2R:Extremity Anemia, Hypertension, Peripheral] [N/A:N/A] Comorbid History: [2R:Venous Disease, Type II Diabetes 06/23/2020] [N/A:N/A] Date Acquired: [2R:64] [N/A:N/A] Weeks of Treatment: [2R:Open] [N/A:N/A] Wound Status: [2R:Yes] [N/A:N/A] Wound Recurrence: [2R:0.7x0.3x0.4] [N/A:N/A] Measurements L x W x D (cm) [2R:0.165] [N/A:N/A] A (cm) : rea [2R:0.066] [N/A:N/A] Volume (cm) : [2R:-432.30%] [N/A:N/A] % Reduction in A [2R:rea: -2100.00%] [N/A:N/A] % Reduction in Volume: [2R:6] Starting Position 1 (o'clock): [  2R:12] Ending Position 1 (o'clock): [2R:0.6] Maximum Distance 1 (cm): [2R:Yes] [N/A:N/A] Undermining: [2R:Grade 2] [N/A:N/A] Classification: [2R:Medium] [N/A:N/A] Exudate A mount: [2R:Serosanguineous] [N/A:N/A] Exudate Type: [2R:red, brown] [N/A:N/A] Exudate Color: [2R:Thickened] [N/A:N/A] Wound Margin: [2R:Large (67-100%)] [N/A:N/A] Granulation A mount: [2R:Red] [N/A:N/A] Granulation Quality: [2R:None Present (0%)] [N/A:N/A] Necrotic A mount: [2R:Fat Layer (Subcutaneous Tissue): Yes N/A] Exposed Structures: [2R:Fascia: No Tendon: No Muscle: No Joint: No Bone: No Small (1-33%)] [N/A:N/A] Epithelialization: [2R:Debridement - Excisional] [N/A:N/A] Debridement: Pre-procedure Verification/Time Out 13:00 [N/A:N/A] Taken: [2R:Other] [N/A:N/A] Pain Control: [2R:Callus, Subcutaneous, Slough] [N/A:N/A] Tissue Debrided: [2R:Skin/Subcutaneous Tissue] [N/A:N/A] Level: [2R:1.2] [N/A:N/A] Debridement A (sq cm): [2R:rea Blade, Curette, Forceps] [N/A:N/A] Instrument: [2R:Minimum] [N/A:N/A] Bleeding: [2R:Silver Nitrate] [N/A:N/A] Hemostasis A  chieved: [2R:0] [N/A:N/A] Procedural Pain: [2R:0] [N/A:N/A] Post Procedural Pain: [2R:Procedure was tolerated well] [N/A:N/A] Debridement Treatment Response: [2R:0.7x0.3x0.4] [N/A:N/A] Post Debridement Measurements L x W x D (cm) [2R:0.066] [N/A:N/A] Post Debridement Volume: (cm) [2R:Cellular or Tissue Based Product] [N/A:N/A] Procedures Performed: [2R:Debridement] Treatment Notes Wound #2R (Metatarsal head first) Wound Laterality: Left Cleanser Soap and Water Discharge Instruction: May shower and wash wound with dial antibacterial soap and water prior to dressing change. Wound Cleanser Discharge Instruction: Cleanse the wound with wound cleanser prior to applying a clean dressing using gauze sponges, not tissue or cotton balls. Peri-Wound Care Sween Lotion (Moisturizing lotion) Discharge Instruction: Apply moisturizing lotion to leg Topical Primary Dressing Affinity Secondary Dressing Woven Gauze Sponges 2x2 in Discharge Instruction: Apply over primary dressing as directed. Optifoam Non-Adhesive Dressing, 4x4 in Discharge Instruction: cut to make foam donut to help offload ADAPTIC TOUCH 3x4.25 in Discharge Instruction: Apply over primary dressing as directed. Secured With [employer], 4.5x3.1 (in/yd) Discharge Instruction: Secure with Kerlix as directed. 3M Medipore H Soft Cloth Surgical T ape, 2x2 (in/yd) Discharge Instruction: Secure dressing with tape as directed. Compression Wrap Compression Stockings Add-Ons Electronic Signature(s) Signed: 11/11/2021 1:57:04 PM By: Kalman Shan DO Signed: 11/11/2021 5:06:43 PM By: Deon Pilling RN, BSN Entered By: Kalman Shan on 11/11/2021 13:52:19 -------------------------------------------------------------------------------- Multi-Disciplinary Care Plan Details Patient Name: Date of Service: [name] [name] [name], [name] 11/11/2021 12:30 PM Medical Record Number: [id number] Patient Account Number: [id number] Date of  Birth/Sex: Treating RN: [birth year] (50 y.o. [name] [name] Primary Care Dafne Nield: [name] Other Clinician: Referring Minnie Legros: Treating Bayle Calvo/Extender: Delano Metz in Treatment: 110 [place] reviewed with physician Active Inactive Abuse / Safety / Falls / Self Care Management Nursing Diagnoses: History of Falls Goals: Patient/caregiver will verbalize/demonstrate measures taken to prevent injury and/or falls Date Initiated: 11/04/2021 Target Resolution Date: 12/02/2021 Goal Status: Active Interventions: Assess fall risk on admission and as needed Assess impairment of mobility on admission and as needed per policy Notes: Wound/Skin Impairment Nursing Diagnoses: Impaired tissue integrity Goals: Patient/caregiver will verbalize understanding of skin care regimen Date Initiated: 11/19/2020 Target Resolution Date: 12/05/2021 Goal Status: Active Ulcer/skin breakdown will have a volume reduction of 30% by week 4 Date Initiated: 08/16/2020 Date Inactivated: 03/03/2021 Target Resolution Date: 03/10/2021 Goal Status: Met Interventions: Provide education on ulcer and skin care Notes: 07/24/21: Wound care regimen ongoing. Electronic Signature(s) Signed: 11/11/2021 5:11:02 PM By: Baruch Gouty RN, BSN Entered By: Baruch Gouty on 11/11/2021 12:55:02 -------------------------------------------------------------------------------- Pain Assessment Details Patient Name: Date of Service: [name] [name], [name] 11/11/2021 12:30 PM Medical Record Number: [id number] Patient Account Number: [id number] Date of Birth/Sex: Treating RN: Sep 14, 1971 (50 y.o. [name] [name] Primary Care Dominico Rod: [name] Other Clinician: Referring Shamel Germond: Treating Teodor Prater/Extender: Delano Metz in Treatment: [phone]  Active Problems Location of Pain Severity and Description of Pain Patient Has Paino Yes Site  Locations With Dressing Change: No Duration of the Pain. Constant / Intermittento Intermittent Rate the pain. Current Pain Level: 5 Least Pain Level: 0 Character of Pain Describe the Pain: Throbbing Pain Management and Medication Current Pain Management: Other: tolerable Is the Current Pain Management Adequate: Adequate How does your wound impact your activities of daily livingo Sleep: No Bathing: No Appetite: No Relationship With Others: No Bladder Continence: No Emotions: No Bowel Continence: No Work: No Toileting: No Drive: No Dressing: No Hobbies: No Electronic Signature(s) Signed: 11/11/2021 5:11:02 PM By: Baruch Gouty RN, BSN Entered By: Baruch Gouty on 11/11/2021 12:42:02 -------------------------------------------------------------------------------- Patient/Caregiver Education Details Patient Name: Date of Service: [name] [name] 12/20/2022andnbsp12:30 PM Medical Record Number: [id number] Patient Account Number: [id number] Date of Birth/Gender: Treating RN: 1971/10/16 (50 y.o. [name] [name] Primary Care Physician: [name] Other Clinician: Referring Physician: Treating Physician/Extender: Delano Metz in Treatment: 23 Education Assessment Education Provided To: Patient Education Topics Provided Elevated Blood Sugar/ Impact on Healing: Methods: Explain/Verbal Responses: Reinforcements needed, State content correctly Offloading: Methods: Explain/Verbal Responses: Reinforcements needed, State content correctly Wound/Skin Impairment: Methods: Explain/Verbal Responses: Reinforcements needed, State content correctly Electronic Signature(s) Signed: 11/11/2021 5:11:02 PM By: Baruch Gouty RN, BSN Entered By: Baruch Gouty on 11/11/2021 12:55:42 -------------------------------------------------------------------------------- Wound Assessment Details Patient Name: Date of Service: [name] [name] [name], [name] MIE  L. 11/11/2021 12:30 PM Medical Record Number: [id number] Patient Account Number: [id number] Date of Birth/Sex: Treating RN: Sep 11, 1971 (50 y.o. 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 Etiology: Wound Location: Left Metatarsal head first Wound Status: Open Wounding Event: Blister Comorbid Anemia, Hypertension, Peripheral Venous Disease, Type II Date Acquired: 06/23/2020 History: Diabetes Weeks Of Treatment: 64 Clustered Wound: No Photos Wound Measurements Length: (cm) 0.7 Width: (cm) 0.3 Depth: (cm) 0.4 Area: (cm) 0.165 Volume: (cm) 0.066 % Reduction in Area: -432.3% % Reduction in Volume: -2100% Epithelialization: Small (1-33%) Tunneling: No Undermining: Yes Starting Position (o'clock): 6 Ending Position (o'clock): 12 Maximum Distance: (cm) 0.6 Wound Description Classification: Grade 2 Wound Margin: Thickened Exudate Amount: Medium Exudate Type: Serosanguineous Exudate Color: red, brown Foul Odor After Cleansing: No Slough/Fibrino No Wound Bed Granulation Amount: Large (67-100%) Exposed Structure Granulation Quality: Red Fascia Exposed: No Necrotic Amount: None Present (0%) Fat Layer (Subcutaneous Tissue) Exposed: Yes Tendon Exposed: No Muscle Exposed: No Joint Exposed: No Bone Exposed: No Treatment Notes Wound #2R (Metatarsal head first) Wound Laterality: Left Cleanser Soap and Water Discharge Instruction: May shower and wash wound with dial antibacterial soap and water prior to dressing change. Wound Cleanser Discharge Instruction: Cleanse the wound with wound cleanser prior to applying a clean dressing using gauze sponges, not tissue or cotton balls. Peri-Wound Care Sween Lotion (Moisturizing lotion) Discharge Instruction: Apply  moisturizing lotion to leg Topical Primary Dressing Affinity Secondary Dressing Woven Gauze Sponges 2x2 in Discharge Instruction: Apply over primary dressing as directed. Optifoam Non-Adhesive Dressing, 4x4 in Discharge Instruction: cut to make foam donut to help offload ADAPTIC TOUCH 3x4.25 in Discharge Instruction: Apply over primary dressing as directed. Secured With [employer], 4.5x3.1 (in/yd) Discharge Instruction: Secure with Kerlix as directed. 29M Medipore H Soft Cloth Surgical T ape, 2x2 (in/yd) Discharge Instruction: Secure dressing with tape as directed. Compression Wrap Compression Stockings Add-Ons Electronic Signature(s) Signed: 11/11/2021 5:06:43  PM By: Deon Pilling RN, BSN Signed: 11/11/2021 5:11:02 PM By: Baruch Gouty RN, BSN Entered By: Deon Pilling on 11/11/2021 12:56:58 -------------------------------------------------------------------------------- Vitals Details Patient Name: Date of Service: [name] [name], [name] 11/11/2021 12:30 PM Medical Record Number: [id number] Patient Account Number: [id number] Date of Birth/Sex: Treating RN: Mar 31, 1971 (50 y.o. [name] [name] Primary Care Sharmaine Bain: [name] Other Clinician: Referring Alizaya Oshea: Treating Bryn Perkin/Extender: Delano Metz in Treatment: 57 Vital Signs Time Taken: 12:44 Temperature (F): 97.9 Height (in): 77 Pulse (bpm): 91 Source: Stated Respiratory Rate (breaths/min): 18 Weight (lbs): 232 Blood Pressure (mmHg): 149/84 Source: Stated Capillary Blood Glucose (mg/dl): 273 Body Mass Index (BMI): 27.5 Reference Range: 80 - 120 mg / dl Notes glucose per pt report this am, being followed by endocrinology Electronic Signature(s) Signed: 11/11/2021 5:11:02 PM By: Baruch Gouty RN, BSN Entered By: Baruch Gouty on 11/11/2021 12:44:49

## 2021-11-11 NOTE — Progress Notes (Signed)
[name], [name] ([id number]) Visit Report for 11/11/2021 Chief Complaint Document Details Patient Name: Date of Service: [name] [name] 11/11/2021 12:30 PM Medical Record Number: [id number] Patient Account Number: [id number] Date of Birth/Sex: Treating RN: 1971-07-13 (50 y.o. [name] [name] Primary Care Provider: [name] Other Clinician: Referring Provider: Treating Provider/Extender: Delano Metz in Treatment: 83 Information Obtained from: Patient Chief Complaint patient is here for a review of the wound on his plantar left first metatarsal head and back of his right leg Electronic Signature(s) Signed: 11/11/2021 1:57:04 PM By: Kalman Shan DO Entered By: Kalman Shan on 11/11/2021 13:52:37 -------------------------------------------------------------------------------- Cellular or Tissue Based Product Details Patient Name: Date of Service: [name], [name] 11/11/2021 12:30 PM Medical Record Number: [id number] Patient Account Number: [id number] Date of Birth/Sex: Treating RN: 1970/12/17 (50 y.o. [name] Primary Care Provider: [name] Other Clinician: Referring Provider: Treating Provider/Extender: Delano Metz in Treatment: 22 Cellular or Tissue Based Product Type Wound #2R Left Metatarsal head first Applied to: Performed By: Physician Kalman Shan, DO Cellular or Tissue Based Product Type: Affinity Level of Consciousness (Pre-procedure): Awake and Alert Pre-procedure Verification/Time Out Yes - 13:05 Taken: Location: genitalia / hands / feet / multiple digits Wound Size (sq cm): 0.21 Product Size (sq cm): 3 Waste Size (sq cm): 0 Amount of Product Applied (sq cm): 3 Instrument Used: Forceps Lot #: [id number] Order #: 6 Expiration Date: 11/12/2021 Fenestrated: No Reconstituted: No Secured: Yes Secured With: Steri-Strips Dressing Applied: Yes Primary Dressing:  adaptic, gauze Procedural Pain: 0 Post Procedural Pain: 0 Response to Treatment: Procedure was tolerated well Level of Consciousness (Post- Awake and Alert procedure): Post Procedure Diagnosis Same as Pre-procedure Electronic Signature(s) Signed: 11/11/2021 1:57:04 PM By: Kalman Shan DO Signed: 11/11/2021 5:11:02 PM By: Baruch Gouty RN, BSN Entered By: Baruch Gouty on 11/11/2021 13:08:50 -------------------------------------------------------------------------------- Debridement Details Patient Name: Date of Service: [name] Shire ND, [name] 11/11/2021 12:30 PM Medical Record Number: [id number] Patient Account Number: [id number] Date of Birth/Sex: Treating RN: 06-Mar-1971 (50 y.o. [name] Primary Care Provider: [name] Other Clinician: Referring Provider: Treating Provider/Extender: Delano Metz in Treatment: 64 Debridement Performed for Assessment: Wound #2R Left Metatarsal head first Performed By: Physician Kalman Shan, DO Debridement Type: Debridement Severity of Tissue Pre Debridement: Fat layer exposed Level of Consciousness (Pre-procedure): Awake and Alert Pre-procedure Verification/Time Out Yes - 13:00 Taken: Start Time: 13:03 Pain Control: Other : Benzocaine 20% SPRAY T Area Debrided (L x W): otal 1.5 (cm) x 0.8 (cm) = 1.2 (cm) Tissue and other material debrided: Viable, Non-Viable, Callus, Slough, Subcutaneous, Skin: Epidermis, Slough Level: Skin/Subcutaneous Tissue Debridement Description: Excisional Instrument: Blade, Curette, Forceps Bleeding: Minimum Hemostasis Achieved: Silver Nitrate Procedural Pain: 0 Post Procedural Pain: 0 Response to Treatment: Procedure was tolerated well Level of Consciousness (Post- Awake and Alert procedure): Post Debridement Measurements of Total Wound Length: (cm) 0.7 Width: (cm) 0.3 Depth: (cm) 0.4 Volume: (cm) 0.066 Character of Wound/Ulcer Post Debridement:  Improved Severity of Tissue Post Debridement: Fat layer exposed Post Procedure Diagnosis Same as Pre-procedure Electronic Signature(s) Signed: 11/11/2021 1:57:04 PM By: Kalman Shan DO Signed: 11/11/2021 5:11:02 PM By: Baruch Gouty RN, BSN Entered By: Baruch Gouty on 11/11/2021 13:14:02 -------------------------------------------------------------------------------- HPI Details Patient Name: Date of Service: [name] Shire ND, [name] 11/11/2021 12:30 PM Medical Record Number: [id number] Patient Account Number: [id number] Date of Birth/Sex: Treating RN: [birth year] (50 y.o. [name] [name] Primary Care Provider: [name] Other Clinician: Referring  Provider: Treating Provider/Extender: Delano Metz in Treatment: 35 History of Present Illness HPI Description: 04/21/18 ADMISSION This is a 50 year old man who is a type II diabetic. In spite of fact his hemoglobin A1c is actually quite good 5.73 months ago he is at a really difficult time over the last 6-7 months. He developed a rapidly progressive infection in the right foot in November 2018 associated with osteomyelitis and necrotizing fasciitis. 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 The stump required and a revision on 11/24/17. The stump revision was advertised as being secondary to falls. I'm not sure the progressive history here however this area is actually closed over. The patient tells me over the same timeframe he has had wounds on the plantar aspect of his left foot. In the ED saw Dr. Sharol Given in April of this year. 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 It is really not clear that this patient is been dressing this with anything. He came in with the clinic without any specific dressing on the wound areas using his own tennis shoe. He tells me that he only ambulates of course to do a pivot transfer. He does not have his prosthesis for the right leg  as of yet and he blames Medicaid for this. He does however use the foot to push himself along in his wheelchair at home. The patient has not had formal arterial studies. ABI in our clinic on the left was 1.13. Patient's past medical history includes type 2 diabetes, fracture of the right fibula. I see that he was treated for abscesses on his right buttock and chest in 2016. I did not look at the microbiology of this. 04/28/18; patient comes back in the clinic today with the wound pretty much the same as when he came in here last week. Small opening lots of undermining relatively. He tells [place] that nothing is really been on this for 3 days in spite of the fact that we gave him enough to dress this easily especially such a small wound. He says he lives with his mother, she is not capable of assisting with this he is changing the dressings himself. 05/05/18; much better-looking wound today. Smaller. There is some undermining medially however that's only perhaps 2 mm. No surrounding erythema. We've been using silver collagen 05/12/18; small wound on the first metatarsal head. No undermining no surrounding erythema. We've been using silver collagen he has home health coming out to change 05/20/18 the wound on the first metatarsal head looks better. Covered in surface debris/callus nonviable tissue. Required debridement but post debridement this looks quite good. We've been using silver collagen. He has home health 05/27/18; first metatarsal head wound continues to improve. Just about completely closed. Still a lot of surface debridement callus. We've been using silver collagen 06/06/18; the first metatarsal wound is completely healed over. There is still a lot of callus. I gently removed some of this just to make sure that there was no open area and there is not. The patient mentions to me that his left leg has felt like "lead" for about a week READMISSION 08/16/2020 This is a 50 year old man with type 2  diabetes. He returns to clinic today with a 1 month history of a blister and callus over the first metatarsal head. This is in exactly the same area as when he was here in 2019. He has a right BKA and a prosthesis from a diabetic  foot infection on the right in 2018. He has standard running shoes on the left foot to match the area in his prosthesis. He has only been covering this area with a Band-Aid has not really been specifically dressing this. ABI in our clinic on the left was 1.16. This is essentially stable from the value in 2019 10/1 the patient has a linear wound over the left first metatarsal head. Again not a lot of callus thick skin around this that I removed with a #3 curette. This is not go deep to bone or muscle it does not look to be infected. 10/8 first metatarsal head. The wound looks like it is closing however this is going to be a difficult area to offload in the future. He has a size 15 shoe and he says his shoes are years old. The inserts in these current shoes are totally useless and I have advised him to replace these. He may need a new pair of shoes and I have he got original prosthesis at [facility] I have asked him to go back there. 11/9; the patient has not been here in more than a month. Not sure he was healed when he was here the last time. I told him he would have to get new shoes and certainly offload the area over the left first metatarsal head. He has done nothing of the kind. He arrives back in clinic with the same old new balance sneakers. A very large separating callus over the first metatarsal head on the left 11/16; he has purchased new shoes and has at least 2 insoles like I asked. The wound is measuring much smaller. He is using silver alginate he changes the dressing himself 11/30; wound is measurably smaller in length of about a half a centimeter. This is generally very little depth. We have been using silver alginate. He changes the dressing himself  every second day. 12/14; wound is about the same size. Significant undermining medially relative to the size of the wound. We have been using silver alginate. There is not a way to offload this area as he walks with a prosthesis on the right leg 12/28; wound is about the same size. Again he has undermining medially. I am using polymen on this 12/02/2020; the wound looks smaller. Still a senescent edge. We have been using polymen 1/24; the wound is come down a few millimeters. Still a senescent edge. I have been using polymen 2/8; the wound is come down slightly. Still with slight undermining from 12-6 o'clock I have been using polymen partially to get offloading on this area which is opposed by his prosthesis on the other leg. 2/22 quite open improvement he still has a comma shaped wound on the left first metatarsal head. Some depth. Using polymen 3/14; he still has the same problem of a comma shaped area over the left first metatarsal head. This seems to have had more depth today at 0.6 cm. We have been using polymen. The patient changes this himself every second day. I explored the idea of a total contact cast on the left leg however this is his driving foot. He was not enamored with the idea of not being able to drive and states that he does not have anybody else to get him around 3/28; again he comes in with a smaller looking wound however there is depth and undermining requiring debridement. We have been using Hydrofera Blue, changed to silver collagen today. The patient is doing the  dressing himself 4/11; patient presents today for follow-up of his left diabetic foot wound. He denies any issues in the past 2 weeks. He is currently using silver collagen every other day with dressing changes. He does not use diabetic shoes or special inserts to his sneakers. He does not use felt pads for offloading. 4/19; patient presents for his 1 week follow-up. He denies any issues. He reports using Hydrofera  Blue and has not been using silver collagen for dressing changes. He reports minimal drainage to the wound. He overall feels well. 5/3; patient presents for 2-week follow-up. He has been using silver collagen without issues. He has no complaints today and states he overall feels well. 5/17; patient presents for 2-week follow-up. He states that the sleeve is rubbing up against the back of his right leg and causing a wound. He states this happened a week ago and has not been dressing the wound. The plantar wound is stable and he has been using collagen every other day. 5/24; Patient was had a recent hospitalization for cellulitis of his right popliteal fossa. He was admitted and started on IV antibiotics. He does not recall the plantar wound dressed while inpatient. He feels a lot better today. He was discharged on 5/21 on oral antibiotics and has not picked this up until today. He reports using collagen to the plantar wound since discharge. He is keeping the right posterior knee wound covered. He is getting a new sleeve for his prostatic on 5/26. He currently denies signs of infection. 6/1; patient presents for 1 week follow-up. He is still on doxycycline. He has not been dressing the back of the right knee wound. He states he is receiving his new prosthetic sleeve tomorrow. He states he is using silver alginate to the plantar foot wound. He currently denies signs of infection. He states he has not been offloading the left foot wound 6/7; patient presents for 1 week follow-up. He completes his last dose of doxycycline today. He finally received his prosthetic sleeve for the right side and he states it feels much better. He has been using antibiotic ointment to the back of that right knee wound. He has been using silver alginate to the plantar foot wound. He currently denies signs of infection. 6/21; patient presents for 2-week follow-up. He reports no issues with his prosthetic sleeve. He has been  using antibiotic ointment to the back of the right knee. He has been using silver alginate to the plantar foot wound. He has no issues or complaints today. He denies signs of infection. 7/5; patient presents for 2-week follow-up. He reports no issues or complaints today. He continues to use antibiotic ointment to the right posterior knee wound and silver alginate to the left plantar foot wound. He denies signs of infection. 7/19; patient presents for 2-week follow-up. He continues to use antibiotic ointment to the right posterior knee and silver alginate to the left plantar foot wound. He has no issues or complaints today. He denies signs of infection. 8/4; patient presents for 2-week follow-up. He has no issues or complaints today. He denies signs of infection. 8/18; patient presents for 2-week follow-up. He has no issues or concerns today. He has been approved for Apligraf and he would like to have this placed today. He denies signs of infection and reports that his right posterior knee wound is closed. 8/25; patient presents for 1 week follow-up. He had no issues with his first Apligraf placement last week. He did however develop an  abscess to the back of his right knee. He reports tenderness to this area. He denies systemic signs of infection. 9/1; patient presents for 1 week follow-up. He has had no issues with his plantar foot wound. The Apligraf has stayed in place over the past week. A wound culture was done at last clinic visit and a new antibiotic was sent to the pharmacy. Patient has not received this yet. He reports some mild tenderness to the back of his right knee. He denies systemic signs of infection. 9/8; patient presents for 1 week follow-up. He states he took Keflex and had diarrhea as a side effect. He was able to complete the course. He reports continued tenderness to the right posterior knee wound. He has some drainage still. He denies increased warmth or erythema to the area. He  denies fever/chills or nausea/vomiting. He has no issues or complaints today about his plantar foot wound. 9/15; patient presents for follow-up. He reports improvement in pain to the back of his right leg. He does not report any drainage. He had an ultrasound completed on 9/9 that showed a superficial fluid collection concerning for abscess. We attempted to call patient with results with no answer. We did put an urgent referral to general surgery and he reports he has not received a call. He currently denies systemic signs of infection. He reports no issues to his left plantar foot wound. 9/27; patient presents for follow-up. He has no issues or complaints today. He has had no issues with the previous wound to the back of his right leg. He has not made an appointment to see general surgery. He had Apligraf placed at last clinic visit to the plantar foot wound. He currently denies signs of infection. 10/7; patient presents for follow-up. Upon entering the room there is vomit throughout the entire floor. Patient states he does not feel well. I recommended he go to urgent care. I recommended following up next week for wound care. 10/10; patient presents for follow-up. He came last week for his follow-up however he was ill and started vomiting in the exam room. Today he states he feels better however he continues to have vomiting episodes. He states he attempted to go to urgent care/the ED but could not find a parking spot. He subsequently went home. He has no issues or complaints today regarding the foot wound. He never followed up with general surgery for the abscess identified on ultrasound to the back of his right knee. 10/20; patient presents for follow-up. He has no issues or complaints today. Overall he feels well today. 10/27; patient presents for follow-up. He has no issues or complaints today. 11/3; patient presents for follow-up. He has no issues or complaints today. He states he does not want  to follow-up with orthopedics to reevaluate the abscess in the back of his right leg. He denies signs of infection. 11/15; patient presents for follow-up. He has no issues or complaints today. He had been using silver alginate to the left first met head wound. He denies signs of infection. 11/22; patient presents for follow-up. He had Affinity placed at last clinic visit. He has no issues or complaints today. 11/29; patient presents for follow-up. He is in good spirits today. He has no issues or complaints today. 12/6; patient presents for follow-up. He has no issues or complaints today. 12/13; patient presents for follow-up. He denies signs of infection. He has no issues or complaints today. 12/20; patient presents for follow-up. He has no issues or  complaints today. He denies signs of infection. Electronic Signature(s) Signed: 11/11/2021 1:57:04 PM By: Kalman Shan DO Entered By: Kalman Shan on 11/11/2021 13:53:04 -------------------------------------------------------------------------------- Physical Exam Details Patient Name: Date of Service: [name], [name] 11/11/2021 12:30 PM Medical Record Number: [id number] Patient Account Number: [id number] Date of Birth/Sex: Treating RN: Nov 21, 1971 (50 y.o. [name] [name] Primary Care Provider: [name] Other Clinician: Referring Provider: Treating Provider/Extender: Delano Metz in Treatment: 72 Constitutional respirations regular, non-labored and within target range for patient.. Cardiovascular 2+ dorsalis pedis/posterior tibialis pulses. Psychiatric pleasant and cooperative. Notes Left leg: T the first met head there is an Open wound with granulation tissue and nonviable tissue with circumferential callus. No signs of infection. o Electronic Signature(s) Signed: 11/11/2021 1:57:04 PM By: Kalman Shan DO Entered By: Kalman Shan on 11/11/2021  13:53:36 -------------------------------------------------------------------------------- Physician Orders Details Patient Name: Date of Service: [name], [name] 11/11/2021 12:30 PM Medical Record Number: [id number] Patient Account Number: [id number] Date of Birth/Sex: Treating RN: 1971-07-05 (50 y.o. [name] Primary Care Provider: [name] Other Clinician: Referring Provider: Treating Provider/Extender: Delano Metz in Treatment: 44 Verbal / Phone Orders: No Diagnosis Coding Follow-up Appointments ppointment in 1 week. - with Dr. Heber [place] - Thursday 12/29 for Affinity application Return A Cellular or Tissue Based Products Cellular or Tissue Based Product Type: - Affinity #6 Cellular or Tissue Based Product applied to wound bed, secured with steri-strips, cover with Adaptic or Mepitel. (DO NOT REMOVE). Edema Control - Lymphedema / SCD / Other Elevate legs to the level of the heart or above for 30 minutes daily and/or when sitting, a frequency of: Avoid standing for long periods of time. Off-Loading Other: - minimize walking on left foot related to pressure to wound. Aid in wound healing. Wound Treatment Wound #2R - Metatarsal head first Wound Laterality: Left Cleanser: Soap and Water 1 x Per Week/15 Days Discharge Instructions: May shower and wash wound with dial antibacterial soap and water prior to dressing change. Cleanser: Wound Cleanser (Generic) 1 x Per Week/15 Days Discharge Instructions: Cleanse the wound with wound cleanser prior to applying a clean dressing using gauze sponges, not tissue or cotton balls. Peri-Wound Care: Sween Lotion (Moisturizing lotion) 1 x Per Week/15 Days Discharge Instructions: Apply moisturizing lotion to leg Prim Dressing: Affinity ary 1 x Per Week/15 Days Secondary Dressing: Woven Gauze Sponges 2x2 in (Generic) 1 x Per Week/15 Days Discharge Instructions: Apply over primary dressing as  directed. Secondary Dressing: Optifoam Non-Adhesive Dressing, 4x4 in (Generic) 1 x Per Week/15 Days Discharge Instructions: cut to make foam donut to help offload Secondary Dressing: ADAPTIC TOUCH 3x4.25 in 1 x Per Week/15 Days Discharge Instructions: Apply over primary dressing as directed. Secured With: [employer], 4.5x3.1 (in/yd) 1 x Per Week/15 Days Discharge Instructions: Secure with Kerlix as directed. Secured With: 58M Medipore H Soft Cloth Surgical Tape, 2x2 (in/yd) (Generic) 1 x Per Week/15 Days Discharge Instructions: Secure dressing with tape as directed. Patient Medications llergies: adhesive tape A Notifications Medication Indication Start End prior to debridement 11/11/2021 benzocaine DOSE topical 20 % aerosol - aerosol topical Electronic Signature(s) Signed: 11/11/2021 1:57:04 PM By: Kalman Shan DO Entered By: Kalman Shan on 11/11/2021 13:53:53 -------------------------------------------------------------------------------- Problem List Details Patient Name: Date of Service: [name] Shire ND, [place] 11/11/2021 12:30 PM Medical Record Number: [id number] Patient Account Number: [id number] Date of Birth/Sex: Treating RN: [birth year] (50 y.o. [name] Primary Care Provider: [name] Other Clinician: Referring Provider: Treating  Provider/Extender: Delano Metz in Treatment: 57 Active Problems ICD-10 Encounter Code Description Active Date MDM Diagnosis L97.522 Non-pressure chronic ulcer of other part of left foot with fat layer exposed 08/16/2020 No Yes E11.621 Type 2 diabetes mellitus with foot ulcer 08/16/2020 No Yes E11.42 Type 2 diabetes mellitus with diabetic polyneuropathy 08/16/2020 No Yes Z89.511 Acquired absence of right leg below knee 08/16/2020 No Yes Inactive Problems Resolved Problems ICD-10 Code Description Active Date Resolved Date S81.801D Unspecified open wound, right lower leg, subsequent  encounter 04/08/2021 04/08/2021 Electronic Signature(s) Signed: 11/11/2021 1:57:04 PM By: Kalman Shan DO Entered By: Kalman Shan on 11/11/2021 13:52:12 -------------------------------------------------------------------------------- Progress Note Details Patient Name: Date of Service: [place] ND, [name] 11/11/2021 12:30 PM Medical Record Number: [id number] Patient Account Number: [id number] Date of Birth/Sex: Treating RN: 1971/01/12 (50 y.o. [name] [name] Primary Care Provider: [name] Other Clinician: Referring Provider: Treating Provider/Extender: Delano Metz in Treatment: 72 Subjective Chief Complaint Information obtained from Patient patient is here for a review of the wound on his plantar left first metatarsal head and back of his right leg History of Present Illness (HPI) 04/21/18 ADMISSION This is a 50 year old man who is a type II diabetic. In spite of fact his hemoglobin A1c is actually quite good 5.73 months ago he is at a really difficult time over the last 6-7 months. He developed a rapidly progressive infection in the right foot in November 2018 associated with osteomyelitis and necrotizing fasciitis. 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 The stump required and a revision on 11/24/17. The stump revision was advertised as being secondary to falls. I'm not sure the progressive history here however this area is actually closed over. The patient tells me over the same timeframe he has had wounds on the plantar aspect of his left foot. In the ED saw Dr. Sharol Given in April of this year. 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 It is really not clear that this patient is been dressing this with anything. He came in with the clinic without any specific dressing on the wound areas using his own tennis shoe. He tells me that he only ambulates of course to do a pivot transfer. He does not have his  prosthesis for the right leg as of yet and he blames Medicaid for this. He does however use the foot to push himself along in his wheelchair at home. The patient has not had formal arterial studies. ABI in our clinic on the left was 1.13. Patient's past medical history includes type 2 diabetes, fracture of the right fibula. I see that he was treated for abscesses on his right buttock and chest in 2016. I did not look at the microbiology of this. 04/28/18; patient comes back in the clinic today with the wound pretty much the same as when he came in here last week. Small opening lots of undermining relatively. He tells [place] that nothing is really been on this for 3 days in spite of the fact that we gave him enough to dress this easily especially such a small wound. He says he lives with his mother, she is not capable of assisting with this he is changing the dressings himself. 05/05/18; much better-looking wound today. Smaller. There is some undermining medially however that's only perhaps 2 mm. No surrounding erythema. We've been using silver collagen 05/12/18; small wound on the first metatarsal head. No undermining no surrounding erythema.  We've been using silver collagen he has home health coming out to change 05/20/18 the wound on the first metatarsal head looks better. Covered in surface debris/callus nonviable tissue. Required debridement but post debridement this looks quite good. We've been using silver collagen. He has home health 05/27/18; first metatarsal head wound continues to improve. Just about completely closed. Still a lot of surface debridement callus. We've been using silver collagen 06/06/18; the first metatarsal wound is completely healed over. There is still a lot of callus. I gently removed some of this just to make sure that there was no open area and there is not. The patient mentions to me that his left leg has felt like "lead" for about a week READMISSION 08/16/2020 This is a  50 year old man with type 2 diabetes. He returns to clinic today with a 1 month history of a blister and callus over the first metatarsal head. This is in exactly the same area as when he was here in 2019. 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 He has standard running shoes on the left foot to match the area in his prosthesis. He has only been covering this area with a Band-Aid has not really been specifically dressing this. ABI in our clinic on the left was 1.16. This is essentially stable from the value in 2019 10/1 the patient has a linear wound over the left first metatarsal head. Again not a lot of callus thick skin around this that I removed with a #3 curette. This is not go deep to bone or muscle it does not look to be infected. 10/8 first metatarsal head. The wound looks like it is closing however this is going to be a difficult area to offload in the future. He has a size 15 shoe and he says his shoes are years old. The inserts in these current shoes are totally useless and I have advised him to replace these. He may need a new pair of shoes and I have he got original prosthesis at [facility] I have asked him to go back there. 11/9; the patient has not been here in more than a month. Not sure he was healed when he was here the last time. I told him he would have to get new shoes and certainly offload the area over the left first metatarsal head. He has done nothing of the kind. He arrives back in clinic with the same old new balance sneakers. A very large separating callus over the first metatarsal head on the left 11/16; he has purchased new shoes and has at least 2 insoles like I asked. The wound is measuring much smaller. He is using silver alginate he changes the dressing himself 11/30; wound is measurably smaller in length of about a half a centimeter. This is generally very little depth. We have been using silver alginate. He  changes the dressing himself every second day. 12/14; wound is about the same size. Significant undermining medially relative to the size of the wound. We have been using silver alginate. There is not a way to offload this area as he walks with a prosthesis on the right leg 12/28; wound is about the same size. Again he has undermining medially. I am using polymen on this 12/02/2020; the wound looks smaller. Still a senescent edge. We have been using polymen 1/24; the wound is come down a few millimeters. Still a senescent edge. I have  been using polymen 2/8; the wound is come down slightly. Still with slight undermining from 12-6 o'clock I have been using polymen partially to get offloading on this area which is opposed by his prosthesis on the other leg. 2/22 quite open improvement he still has a comma shaped wound on the left first metatarsal head. Some depth. Using polymen 3/14; he still has the same problem of a comma shaped area over the left first metatarsal head. This seems to have had more depth today at 0.6 cm. We have been using polymen. The patient changes this himself every second day. I explored the idea of a total contact cast on the left leg however this is his driving foot. He was not enamored with the idea of not being able to drive and states that he does not have anybody else to get him around 3/28; again he comes in with a smaller looking wound however there is depth and undermining requiring debridement. We have been using Hydrofera Blue, changed to silver collagen today. The patient is doing the dressing himself 4/11; patient presents today for follow-up of his left diabetic foot wound. He denies any issues in the past 2 weeks. He is currently using silver collagen every other day with dressing changes. He does not use diabetic shoes or special inserts to his sneakers. He does not use felt pads for offloading. 4/19; patient presents for his 1 week follow-up. He denies any  issues. He reports using Hydrofera Blue and has not been using silver collagen for dressing changes. He reports minimal drainage to the wound. He overall feels well. 5/3; patient presents for 2-week follow-up. He has been using silver collagen without issues. He has no complaints today and states he overall feels well. 5/17; patient presents for 2-week follow-up. He states that the sleeve is rubbing up against the back of his right leg and causing a wound. He states this happened a week ago and has not been dressing the wound. The plantar wound is stable and he has been using collagen every other day. 5/24; Patient was had a recent hospitalization for cellulitis of his right popliteal fossa. He was admitted and started on IV antibiotics. He does not recall the plantar wound dressed while inpatient. He feels a lot better today. He was discharged on 5/21 on oral antibiotics and has not picked this up until today. He reports using collagen to the plantar wound since discharge. He is keeping the right posterior knee wound covered. He is getting a new sleeve for his prostatic on 5/26. He currently denies signs of infection. 6/1; patient presents for 1 week follow-up. He is still on doxycycline. He has not been dressing the back of the right knee wound. He states he is receiving his new prosthetic sleeve tomorrow. He states he is using silver alginate to the plantar foot wound. He currently denies signs of infection. He states he has not been offloading the left foot wound 6/7; patient presents for 1 week follow-up. He completes his last dose of doxycycline today. He finally received his prosthetic sleeve for the right side and he states it feels much better. He has been using antibiotic ointment to the back of that right knee wound. He has been using silver alginate to the plantar foot wound. He currently denies signs of infection. 6/21; patient presents for 2-week follow-up. He reports no issues with his  prosthetic sleeve. He has been using antibiotic ointment to the back of the right knee. He has been  using silver alginate to the plantar foot wound. He has no issues or complaints today. He denies signs of infection. 7/5; patient presents for 2-week follow-up. He reports no issues or complaints today. He continues to use antibiotic ointment to the right posterior knee wound and silver alginate to the left plantar foot wound. He denies signs of infection. 7/19; patient presents for 2-week follow-up. He continues to use antibiotic ointment to the right posterior knee and silver alginate to the left plantar foot wound. He has no issues or complaints today. He denies signs of infection. 8/4; patient presents for 2-week follow-up. He has no issues or complaints today. He denies signs of infection. 8/18; patient presents for 2-week follow-up. He has no issues or concerns today. He has been approved for Apligraf and he would like to have this placed today. He denies signs of infection and reports that his right posterior knee wound is closed. 8/25; patient presents for 1 week follow-up. He had no issues with his first Apligraf placement last week. He did however develop an abscess to the back of his right knee. He reports tenderness to this area. He denies systemic signs of infection. 9/1; patient presents for 1 week follow-up. He has had no issues with his plantar foot wound. The Apligraf has stayed in place over the past week. A wound culture was done at last clinic visit and a new antibiotic was sent to the pharmacy. Patient has not received this yet. He reports some mild tenderness to the back of his right knee. He denies systemic signs of infection. 9/8; patient presents for 1 week follow-up. He states he took Keflex and had diarrhea as a side effect. He was able to complete the course. He reports continued tenderness to the right posterior knee wound. He has some drainage still. He denies increased  warmth or erythema to the area. He denies fever/chills or nausea/vomiting. He has no issues or complaints today about his plantar foot wound. 9/15; patient presents for follow-up. He reports improvement in pain to the back of his right leg. He does not report any drainage. He had an ultrasound completed on 9/9 that showed a superficial fluid collection concerning for abscess. We attempted to call patient with results with no answer. We did put an urgent referral to general surgery and he reports he has not received a call. He currently denies systemic signs of infection. He reports no issues to his left plantar foot wound. 9/27; patient presents for follow-up. He has no issues or complaints today. He has had no issues with the previous wound to the back of his right leg. He has not made an appointment to see general surgery. He had Apligraf placed at last clinic visit to the plantar foot wound. He currently denies signs of infection. 10/7; patient presents for follow-up. Upon entering the room there is vomit throughout the entire floor. Patient states he does not feel well. I recommended he go to urgent care. I recommended following up next week for wound care. 10/10; patient presents for follow-up. He came last week for his follow-up however he was ill and started vomiting in the exam room. Today he states he feels better however he continues to have vomiting episodes. He states he attempted to go to urgent care/the ED but could not find a parking spot. He subsequently went home. He has no issues or complaints today regarding the foot wound. He never followed up with general surgery for the abscess identified on ultrasound to  the back of his right knee. 10/20; patient presents for follow-up. He has no issues or complaints today. Overall he feels well today. 10/27; patient presents for follow-up. He has no issues or complaints today. 11/3; patient presents for follow-up. He has no issues or  complaints today. He states he does not want to follow-up with orthopedics to reevaluate the abscess in the back of his right leg. He denies signs of infection. 11/15; patient presents for follow-up. He has no issues or complaints today. He had been using silver alginate to the left first met head wound. He denies signs of infection. 11/22; patient presents for follow-up. He had Affinity placed at last clinic visit. He has no issues or complaints today. 11/29; patient presents for follow-up. He is in good spirits today. He has no issues or complaints today. 12/6; patient presents for follow-up. He has no issues or complaints today. 12/13; patient presents for follow-up. He denies signs of infection. He has no issues or complaints today. 12/20; patient presents for follow-up. He has no issues or complaints today. He denies signs of infection. Patient History Information obtained from Patient. Family History Diabetes - Maternal Grandparents,Mother, Heart Disease - Father, Hypertension - Father, No family history of Cancer, Hereditary Spherocytosis, Kidney Disease, Lung Disease, Seizures, Stroke, Thyroid Problems, Tuberculosis. Social History Current every day smoker, Marital Status - Single, Alcohol Use - Never, Drug Use - No History, Caffeine Use - Never. Medical History Eyes Denies history of Cataracts, Glaucoma, Optic Neuritis Ear/Nose/Mouth/Throat Denies history of Chronic sinus problems/congestion, Middle ear problems Hematologic/Lymphatic Patient has history of Anemia Denies history of Hemophilia, Human Immunodeficiency Virus, Lymphedema, Sickle Cell Disease Respiratory Denies history of Aspiration, Asthma, Chronic Obstructive Pulmonary Disease (COPD), Pneumothorax, Sleep Apnea, Tuberculosis Cardiovascular Patient has history of Hypertension, Peripheral Venous Disease Denies history of Angina, Arrhythmia, Congestive Heart Failure, Coronary Artery Disease, Deep Vein Thrombosis,  Hypotension, Myocardial Infarction, Peripheral Arterial Disease, Phlebitis, Vasculitis Gastrointestinal Denies history of Cirrhosis , Colitis, Crohnoos, Hepatitis A, Hepatitis B, Hepatitis C Endocrine Patient has history of Type II Diabetes - oral meds Denies history of Type I Diabetes Genitourinary Denies history of End Stage Renal Disease Immunological Denies history of Lupus Erythematosus, Raynaudoos, Scleroderma Integumentary (Skin) Denies history of History of Burn Musculoskeletal Denies history of Gout, Rheumatoid Arthritis, Osteoarthritis, Osteomyelitis Neurologic Denies history of Dementia, Neuropathy, Quadriplegia, Paraplegia, Seizure Disorder Oncologic Denies history of Received Chemotherapy, Received Radiation Psychiatric Denies history of Anorexia/bulimia, Confinement Anxiety Hospitalization/Surgery History - fever 105, sepsis cellulitis right popliteal fossa 04/09/2021. Medical A Surgical History Notes nd Constitutional Symptoms (General Health) falls , leukocytosis Respiratory During waking hours and catches himself not breathing, "gasp for air". Saw Dr Wynonia Lawman, he couldn't find a reason Objective Constitutional respirations regular, non-labored and within target range for patient.. Vitals Time Taken: 12:44 PM, Height: 77 in, Source: Stated, Weight: 232 lbs, Source: Stated, BMI: 27.5, Temperature: 97.9 F, Pulse: 91 bpm, Respiratory Rate: 18 breaths/min, Blood Pressure: 149/84 mmHg, Capillary Blood Glucose: 273 mg/dl. General Notes: glucose per pt report this am, being followed by endocrinology Cardiovascular 2+ dorsalis pedis/posterior tibialis pulses. Psychiatric pleasant and cooperative. General Notes: Left leg: T the first met head there is an Open wound with granulation tissue and nonviable tissue with circumferential callus. No signs of o infection. Integumentary (Hair, Skin) Wound #2R status is Open. Original cause of wound was Blister. The date  acquired was: 06/23/2020. The wound has been in treatment 64 weeks. The wound is located on the Left Metatarsal head first. The wound  measures 0.7cm length x 0.3cm width x 0.4cm depth; 0.165cm^2 area and 0.066cm^3 volume. There is Fat Layer (Subcutaneous Tissue) exposed. There is no tunneling noted, however, there is undermining starting at 6:00 and ending at 12:00 with a maximum distance of 0.6cm. There is a medium amount of serosanguineous drainage noted. The wound margin is thickened. There is large (67-100%) red granulation within the wound bed. There is no necrotic tissue within the wound bed. 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 I debrided callus and nonviable tissue. No signs of infection on exam. Affinity #6 was placed in standard fashion. Recommended aggressive offloading. Procedures Wound #2R Pre-procedure diagnosis of Wound #2R is a Diabetic Wound/Ulcer of the Lower Extremity located on the Left Metatarsal head first .Severity of Tissue Pre Debridement is: Fat layer exposed. There was a Excisional Skin/Subcutaneous Tissue Debridement with a total area of 1.2 sq cm performed by Kalman Shan, DO. With the following instrument(s): Blade, Curette, and Forceps to remove Viable and Non-Viable tissue/material. Material removed includes Callus, Subcutaneous Tissue, Slough, and Skin: Epidermis after achieving pain control using Other (Benzocaine 20% SPRAY). No specimens were taken. A time out was conducted at 13:00, prior to the start of the procedure. A Minimum amount of bleeding was controlled with Silver Nitrate. The procedure was tolerated well with a pain level of 0 throughout and a pain level of 0 following the procedure. Post Debridement Measurements:  0.7cm length x 0.3cm width x 0.4cm depth; 0.066cm^3 volume. Character of Wound/Ulcer Post Debridement is improved. Severity of Tissue Post Debridement is: Fat layer exposed. Post procedure Diagnosis Wound #2R: Same as Pre-Procedure Pre-procedure diagnosis of Wound #2R is a Diabetic Wound/Ulcer of the Lower Extremity located on the Left Metatarsal head first. A skin graft procedure using a bioengineered skin substitute/cellular or tissue based product was performed by Kalman Shan, DO with the following instrument(s): Forceps. Affinity was applied and secured with Steri-Strips. 3 sq cm of product was utilized and 0 sq cm was wasted. Post Application, adaptic, gauze was applied. A Time Out was conducted at 13:05, prior to the start of the procedure. The procedure was tolerated well with a pain level of 0 throughout and a pain level of 0 following the procedure. Post procedure Diagnosis Wound #2R: Same as Pre-Procedure . Plan Follow-up Appointments: Return Appointment in 1 week. - with Dr. Heber Lamont - Thursday 12/29 for Affinity application Cellular or Tissue Based Products: Cellular or Tissue Based Product Type: - Affinity #6 Cellular or Tissue Based Product applied to wound bed, secured with steri-strips, cover with Adaptic or Mepitel. (DO NOT REMOVE). Edema Control - Lymphedema / SCD / Other: Elevate legs to the level of the heart or above for 30 minutes daily and/or when sitting, a frequency of: Avoid standing for long periods of time. Off-Loading: Other: - minimize walking on left foot related to pressure to wound. Aid in wound healing. The following medication(s) was prescribed: benzocaine topical 20 % aerosol aerosol topical for prior to debridement was prescribed at facility WOUND #2R: - Metatarsal head first Wound Laterality: Left Cleanser: Soap and Water 1 x Per Week/15 Days Discharge Instructions: May shower and wash wound with dial antibacterial soap and water prior to dressing  change. Cleanser: Wound Cleanser (Generic) 1 x Per Week/15 Days Discharge Instructions: Cleanse the wound  with wound cleanser prior to applying a clean dressing using gauze sponges, not tissue or cotton balls. Peri-Wound Care: Sween Lotion (Moisturizing lotion) 1 x Per Week/15 Days Discharge Instructions: Apply moisturizing lotion to leg Prim Dressing: Affinity 1 x Per Week/15 Days ary Secondary Dressing: Woven Gauze Sponges 2x2 in (Generic) 1 x Per Week/15 Days Discharge Instructions: Apply over primary dressing as directed. Secondary Dressing: Optifoam Non-Adhesive Dressing, 4x4 in (Generic) 1 x Per Week/15 Days Discharge Instructions: cut to make foam donut to help offload Secondary Dressing: ADAPTIC TOUCH 3x4.25 in 1 x Per Week/15 Days Discharge Instructions: Apply over primary dressing as directed. Secured With: [employer], 4.5x3.1 (in/yd) 1 x Per Week/15 Days Discharge Instructions: Secure with Kerlix as directed. Secured With: 39M Medipore H Soft Cloth Surgical T ape, 2x2 (in/yd) (Generic) 1 x Per Week/15 Days Discharge Instructions: Secure dressing with tape as directed. 1. In office sharp debridement 2. Affinity #6 placed in standard fashion 3. Follow-up in 1 week Electronic Signature(s) Signed: 11/11/2021 1:57:04 PM By: Kalman Shan DO Signed: 11/11/2021 1:57:04 PM By: Kalman Shan DO Entered By: Kalman Shan on 11/11/2021 13:56:00 -------------------------------------------------------------------------------- HxROS Details Patient Name: Date of Service: [name], [name] 11/11/2021 12:30 PM Medical Record Number: [id number] Patient Account Number: [id number] Date of Birth/Sex: Treating RN: [birth year] (50 y.o. [name] [name] Primary Care Provider: [name] Other Clinician: Referring Provider: Treating Provider/Extender: Delano Metz in Treatment: 43 Information Obtained From Patient Constitutional Symptoms  (General Health) Medical History: Past Medical History Notes: falls , leukocytosis Eyes Medical History: Negative for: Cataracts; Glaucoma; Optic Neuritis Ear/Nose/Mouth/Throat Medical History: Negative for: Chronic sinus problems/congestion; Middle ear problems Hematologic/Lymphatic Medical History: Positive for: Anemia Negative for: Hemophilia; Human Immunodeficiency Virus; Lymphedema; Sickle Cell Disease Respiratory Medical History: Negative for: Aspiration; Asthma; Chronic Obstructive Pulmonary Disease (COPD); Pneumothorax; Sleep Apnea; Tuberculosis Past Medical History Notes: During waking hours and catches himself not breathing, "gasp for air". Saw Dr Wynonia Lawman, he couldn't find a reason Cardiovascular Medical History: Positive for: Hypertension; Peripheral Venous Disease Negative for: Angina; Arrhythmia; Congestive Heart Failure; Coronary Artery Disease; Deep Vein Thrombosis; Hypotension; Myocardial Infarction; Peripheral Arterial Disease; Phlebitis; Vasculitis Gastrointestinal Medical History: Negative for: Cirrhosis ; Colitis; Crohns; Hepatitis A; Hepatitis B; Hepatitis C Endocrine Medical History: Positive for: Type II Diabetes - oral meds Negative for: Type I Diabetes Time with diabetes: 2014 Treated with: Oral agents Blood sugar tested every day: Yes Tested : Blood sugar testing results: Breakfast: 91 Genitourinary Medical History: Negative for: End Stage Renal Disease Immunological Medical History: Negative for: Lupus Erythematosus; Raynauds; Scleroderma Integumentary (Skin) Medical History: Negative for: History of Burn Musculoskeletal Medical History: Negative for: Gout; Rheumatoid Arthritis; Osteoarthritis; Osteomyelitis Neurologic Medical History: Negative for: Dementia; Neuropathy; Quadriplegia; Paraplegia; Seizure Disorder Oncologic Medical History: Negative for: Received Chemotherapy; Received Radiation Psychiatric Medical History: Negative  for: Anorexia/bulimia; Confinement Anxiety Immunizations Pneumococcal Vaccine: Received Pneumococcal Vaccination: No Implantable Devices No devices added Hospitalization / Surgery History Type of Hospitalization/Surgery fever 105, sepsis cellulitis right popliteal fossa 04/09/2021 Family and Social History Cancer: No; Diabetes: Yes - Maternal Grandparents,Mother; Heart Disease: Yes - Father; Hereditary Spherocytosis: No; Hypertension: Yes - Father; Kidney Disease: No; Lung Disease: No; Seizures: No; Stroke: No; Thyroid Problems: No; Tuberculosis: No; Current every day smoker; Marital Status - Single; Alcohol Use: Never; Drug Use: No History; Caffeine Use: Never; Financial Concerns: No; Food, Clothing or Shelter Needs: No; Support System Lacking: No; Transportation Concerns: No Electronic Signature(s) Signed: 11/11/2021 1:57:04 PM By: Kalman Shan DO Signed:  11/11/2021 5:06:43 PM By: Deon Pilling RN, BSN Entered By: Kalman Shan on 11/11/2021 13:53:12 -------------------------------------------------------------------------------- SuperBill Details Patient Name: Date of Service: [name] Shire ND, [name] 11/11/2021 Medical Record Number: [id number] Patient Account Number: [id number] Date of Birth/Sex: Treating RN: 1971-03-01 (50 y.o. 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 Facility Procedures CPT4 Code: [id number] Description: X0940 AFFINITY, 1.5X 1.5 SQ CM Modifier: Quantity: 3 CPT4 Code: [id number] Description: 03159 - SKIN SUB GRAFT FACE/NK/HF/G  ICD-10 Diagnosis Description L97.522 Non-pressure chronic ulcer of other part of left foot with fat layer exposed Modifier: Quantity: 1 Physician Procedures : CPT4 Code Description Modifier [id number] - WC PHYS SKIN SUB GRAFT FACE/NK/HF/G ICD-10 Diagnosis Description L97.522 Non-pressure chronic ulcer of other part of left foot with fat layer exposed Quantity: 1 Electronic Signature(s) Signed: 11/11/2021 1:57:04 PM By: Kalman Shan DO Entered By: Kalman Shan on 11/11/2021 13:56:42

## 2021-11-14 ENCOUNTER — Encounter (HOSPITAL_COMMUNITY): Payer: Medicare Other

## 2021-11-14 ENCOUNTER — Ambulatory Visit (HOSPITAL_COMMUNITY)
Admission: RE | Admit: 2021-11-14 | Discharge: 2021-11-14 | Disposition: A | Discharge status: Home / Self Care | Payer: Medicare Other | Source: Ambulatory Visit | Attending: Nephrology | Admitting: Nephrology

## 2021-11-14 VITALS — BP 176/91 | HR 94 | Temp 97.1°F | Resp 18

## 2021-11-14 DIAGNOSIS — D62 Acute posthemorrhagic anemia: Secondary | ICD-10-CM | POA: Diagnosis present

## 2021-11-14 DIAGNOSIS — N179 Acute kidney failure, unspecified: Secondary | ICD-10-CM

## 2021-11-14 DIAGNOSIS — D649 Anemia, unspecified: Secondary | ICD-10-CM | POA: Diagnosis present

## 2021-11-14 LAB — FERRITIN: Ferritin: 36 ng/mL (ref 24–336)

## 2021-11-14 LAB — IRON AND TIBC
Iron: 70 ug/dL (ref 45–182)
Saturation Ratios: 23 % (ref 17.9–39.5)
TIBC: 305 ug/dL (ref 250–450)
UIBC: 235 ug/dL

## 2021-11-14 LAB — POCT HEMOGLOBIN-HEMACUE: Hemoglobin: 10.8 g/dL — ABNORMAL LOW (ref 13.0–17.0)

## 2021-11-14 MED ORDER — EPOETIN ALFA-EPBX 10000 UNIT/ML IJ SOLN
INTRAMUSCULAR | Status: AC
  Filled 2021-11-14: qty 1

## 2021-11-14 MED ORDER — EPOETIN ALFA-EPBX 10000 UNIT/ML IJ SOLN
10000.0000 [IU] | INTRAMUSCULAR | Status: DC
  Administered 2021-11-14: 13:00:00 10000 [IU] via SUBCUTANEOUS

## 2021-11-20 ENCOUNTER — Other Ambulatory Visit: Payer: Self-pay

## 2021-11-20 ENCOUNTER — Encounter (HOSPITAL_BASED_OUTPATIENT_CLINIC_OR_DEPARTMENT_OTHER): Payer: Medicare Other | Admitting: Internal Medicine

## 2021-11-20 DIAGNOSIS — L97522 Non-pressure chronic ulcer of other part of left foot with fat layer exposed: Secondary | ICD-10-CM

## 2021-11-20 DIAGNOSIS — E11621 Type 2 diabetes mellitus with foot ulcer: Secondary | ICD-10-CM | POA: Diagnosis not present

## 2021-11-21 NOTE — Progress Notes (Signed)
HRIDAY, STAI ([id number]) Visit Report for 11/20/2021 Chief Complaint Document Details Patient Name: Date of Service: [name] Arrow [name] 11/20/2021 1:00 PM Medical Record Number: [id number] Patient Account Number: [id number] Date of Birth/Sex: Treating RN: 1971-08-04 (50 y.o. [name] Primary Care Provider: [name] Other Clinician: Referring Provider: Treating Provider/Extender: Delano Metz in Treatment: 75 Information Obtained from: Patient Chief Complaint patient is here for a review of the wound on his plantar left first metatarsal head and back of his right leg Electronic Signature(s) Signed: 11/20/2021 1:27:25 PM By: Kalman Shan DO Entered By: Kalman Shan on 11/20/2021 13:21:20 -------------------------------------------------------------------------------- Cellular or Tissue Based Product Details Patient Name: Date of Service: [name] [name], [name] 11/20/2021 1:00 PM Medical Record Number: [id number] Patient Account Number: [id number] Date of Birth/Sex: Treating RN: 04-09-1971 (50 y.o. [name] Primary Care Provider: [name] Other Clinician: Referring Provider: Treating Provider/Extender: Delano Metz in Treatment: 30 Cellular or Tissue Based Product Type Wound #2R Left Metatarsal head first Applied to: Performed By: Physician Kalman Shan, DO Cellular or Tissue Based Product Type: Affinity Level of Consciousness (Pre-procedure): Awake and Alert Pre-procedure Verification/Time Out Yes - 13:10 Taken: Location: genitalia / hands / feet / multiple digits Wound Size (sq cm): 0.08 Product Size (sq cm): 3 Waste Size (sq cm): 0 Amount of Product Applied (sq cm): 3 Lot #: [id number] Order #: 7 Expiration Date: 11/20/2021 Fenestrated: No Reconstituted: No Secured: Yes Secured With: Steri-Strips Dressing Applied: Yes Primary Dressing: gauze, kerlix Procedural  Pain: 0 Post Procedural Pain: 0 Response to Treatment: Procedure was tolerated well Level of Consciousness (Post- Awake and Alert procedure): Post Procedure Diagnosis Same as Pre-procedure Electronic Signature(s) Signed: 11/20/2021 1:27:25 PM By: Kalman Shan DO Signed: 11/21/2021 2:54:09 PM By: Levan Hurst RN, BSN Entered By: Levan Hurst on 11/20/2021 13:26:58 -------------------------------------------------------------------------------- Debridement Details Patient Name: Date of Service: [name] [name], [name] 11/20/2021 1:00 PM Medical Record Number: [id number] Patient Account Number: [id number] Date of Birth/Sex: Treating RN: 06/02/1971 (50 y.o. [name] Primary Care Provider: [name] Other Clinician: Referring Provider: Treating Provider/Extender: Delano Metz in Treatment: 83 Debridement Performed for Assessment: Wound #2R Left Metatarsal head first Performed By: Physician Kalman Shan, DO Debridement Type: Debridement Severity of Tissue Pre Debridement: Fat layer exposed Level of Consciousness (Pre-procedure): Awake and Alert Pre-procedure Verification/Time Out Yes - 13:05 Taken: Start Time: 13:05 T Area Debrided (L x W): otal 0.8 (cm) x 0.1 (cm) = 0.08 (cm) Tissue and other material debrided: Viable, Non-Viable, Callus, Subcutaneous Level: Skin/Subcutaneous Tissue Debridement Description: Excisional Instrument: Curette Bleeding: Minimum Hemostasis Achieved: Pressure End Time: 13:07 Procedural Pain: 0 Post Procedural Pain: 0 Response to Treatment: Procedure was tolerated well Level of Consciousness (Post- Awake and Alert procedure): Post Debridement Measurements of Total Wound Length: (cm) 0.8 Width: (cm) 0.1 Depth: (cm) 0.5 Volume: (cm) 0.031 Character of Wound/Ulcer Post Debridement: Improved Severity of Tissue Post Debridement: Fat layer exposed Post Procedure Diagnosis Same as  Pre-procedure Electronic Signature(s) Signed: 11/20/2021 1:27:25 PM By: Kalman Shan DO Signed: 11/21/2021 2:54:09 PM By: Levan Hurst RN, BSN Entered By: Levan Hurst on 11/20/2021 13:07:26 -------------------------------------------------------------------------------- HPI Details Patient Name: Date of Service: [name] [name], [name] 11/20/2021 1:00 PM Medical Record Number: [id number] Patient Account Number: [id number] Date of Birth/Sex: Treating RN: 10-22-1971 (50 y.o. [name] Primary Care Provider: [name] Other Clinician: Referring Provider: Treating Provider/Extender: Delano Metz in Treatment: 64 History of Present  Illness HPI Description: 04/21/18 ADMISSION This is a 50 year old man who is a type II diabetic. In spite of fact his hemoglobin A1c is actually quite good 5.73 months ago he is at a really difficult time over the last 6-7 months. He developed a rapidly progressive infection in the right foot in November 2018 associated with osteomyelitis and necrotizing fasciitis. 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 The stump required and a revision on 11/24/17. The stump revision was advertised as being secondary to falls. I'm not sure the progressive history here however this area is actually closed over. The patient tells me over the same timeframe he has had wounds on the plantar aspect of his left foot. In the ED saw Dr. Sharol Given in April of this year. 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 It is really not clear that this patient is been dressing this with anything. He came in with the clinic without any specific dressing on the wound areas using his own tennis shoe. He tells me that he only ambulates of course to do a pivot transfer. He does not have his prosthesis for the right leg as of yet and he blames Medicaid for this. He does however use the foot to push himself along in his  wheelchair at home. The patient has not had formal arterial studies. ABI in our clinic on the left was 1.13. Patient's past medical history includes type 2 diabetes, fracture of the right fibula. I see that he was treated for abscesses on his right buttock and chest in 2016. I did not look at the microbiology of this. 04/28/18; patient comes back in the clinic today with the wound pretty much the same as when he came in here last week. Small opening lots of undermining relatively. He tells [place] that nothing is really been on this for 3 days in spite of the fact that we gave him enough to dress this easily especially such a small wound. He says he lives with his mother, she is not capable of assisting with this he is changing the dressings himself. 05/05/18; much better-looking wound today. Smaller. There is some undermining medially however that's only perhaps 2 mm. No surrounding erythema. We've been using silver collagen 05/12/18; small wound on the first metatarsal head. No undermining no surrounding erythema. We've been using silver collagen he has home health coming out to change 05/20/18 the wound on the first metatarsal head looks better. Covered in surface debris/callus nonviable tissue. Required debridement but post debridement this looks quite good. We've been using silver collagen. He has home health 05/27/18; first metatarsal head wound continues to improve. Just about completely closed. Still a lot of surface debridement callus. We've been using silver collagen 06/06/18; the first metatarsal wound is completely healed over. There is still a lot of callus. I gently removed some of this just to make sure that there was no open area and there is not. The patient mentions to me that his left leg has felt like "lead" for about a week READMISSION 08/16/2020 This is a 50 year old man with type 2 diabetes. He returns to clinic today with a 1 month history of a blister and callus over the first  metatarsal head. This is in exactly the same area as when he was here in 2019. 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 He has standard running shoes on the  left foot to match the area in his prosthesis. He has only been covering this area with a Band-Aid has not really been specifically dressing this. ABI in our clinic on the left was 1.16. This is essentially stable from the value in 2019 10/1 the patient has a linear wound over the left first metatarsal head. Again not a lot of callus thick skin around this that I removed with a #3 curette. This is not go deep to bone or muscle it does not look to be infected. 10/8 first metatarsal head. The wound looks like it is closing however this is going to be a difficult area to offload in the future. He has a size 15 shoe and he says his shoes are years old. The inserts in these current shoes are totally useless and I have advised him to replace these. He may need a new pair of shoes and I have he got original prosthesis at [facility] I have asked him to go back there. 11/9; the patient has not been here in more than a month. Not sure he was healed when he was here the last time. I told him he would have to get new shoes and certainly offload the area over the left first metatarsal head. He has done nothing of the kind. He arrives back in clinic with the same old new balance sneakers. A very large separating callus over the first metatarsal head on the left 11/16; he has purchased new shoes and has at least 2 insoles like I asked. The wound is measuring much smaller. He is using silver alginate he changes the dressing himself 11/30; wound is measurably smaller in length of about a half a centimeter. This is generally very little depth. We have been using silver alginate. He changes the dressing himself every second day. 12/14; wound is about the same size. Significant undermining medially relative  to the size of the wound. We have been using silver alginate. There is not a way to offload this area as he walks with a prosthesis on the right leg 12/28; wound is about the same size. Again he has undermining medially. I am using polymen on this 12/02/2020; the wound looks smaller. Still a senescent edge. We have been using polymen 1/24; the wound is come down a few millimeters. Still a senescent edge. I have been using polymen 2/8; the wound is come down slightly. Still with slight undermining from 12-6 o'clock I have been using polymen partially to get offloading on this area which is opposed by his prosthesis on the other leg. 2/22 quite open improvement he still has a comma shaped wound on the left first metatarsal head. Some depth. Using polymen 3/14; he still has the same problem of a comma shaped area over the left first metatarsal head. This seems to have had more depth today at 0.6 cm. We have been using polymen. The patient changes this himself every second day. I explored the idea of a total contact cast on the left leg however this is his driving foot. He was not enamored with the idea of not being able to drive and states that he does not have anybody else to get him around 3/28; again he comes in with a smaller looking wound however there is depth and undermining requiring debridement. We have been using Hydrofera Blue, changed to silver collagen today. The patient is doing the dressing himself 4/11; patient presents today for follow-up of his left diabetic foot wound.  He denies any issues in the past 2 weeks. He is currently using silver collagen every other day with dressing changes. He does not use diabetic shoes or special inserts to his sneakers. He does not use felt pads for offloading. 4/19; patient presents for his 1 week follow-up. He denies any issues. He reports using Hydrofera Blue and has not been using silver collagen for dressing changes. He reports minimal drainage to  the wound. He overall feels well. 5/3; patient presents for 2-week follow-up. He has been using silver collagen without issues. He has no complaints today and states he overall feels well. 5/17; patient presents for 2-week follow-up. He states that the sleeve is rubbing up against the back of his right leg and causing a wound. He states this happened a week ago and has not been dressing the wound. The plantar wound is stable and he has been using collagen every other day. 5/24; Patient was had a recent hospitalization for cellulitis of his right popliteal fossa. He was admitted and started on IV antibiotics. He does not recall the plantar wound dressed while inpatient. He feels a lot better today. He was discharged on 5/21 on oral antibiotics and has not picked this up until today. He reports using collagen to the plantar wound since discharge. He is keeping the right posterior knee wound covered. He is getting a new sleeve for his prostatic on 5/26. He currently denies signs of infection. 6/1; patient presents for 1 week follow-up. He is still on doxycycline. He has not been dressing the back of the right knee wound. He states he is receiving his new prosthetic sleeve tomorrow. He states he is using silver alginate to the plantar foot wound. He currently denies signs of infection. He states he has not been offloading the left foot wound 6/7; patient presents for 1 week follow-up. He completes his last dose of doxycycline today. He finally received his prosthetic sleeve for the right side and he states it feels much better. He has been using antibiotic ointment to the back of that right knee wound. He has been using silver alginate to the plantar foot wound. He currently denies signs of infection. 6/21; patient presents for 2-week follow-up. He reports no issues with his prosthetic sleeve. He has been using antibiotic ointment to the back of the right knee. He has been using silver alginate to the  plantar foot wound. He has no issues or complaints today. He denies signs of infection. 7/5; patient presents for 2-week follow-up. He reports no issues or complaints today. He continues to use antibiotic ointment to the right posterior knee wound and silver alginate to the left plantar foot wound. He denies signs of infection. 7/19; patient presents for 2-week follow-up. He continues to use antibiotic ointment to the right posterior knee and silver alginate to the left plantar foot wound. He has no issues or complaints today. He denies signs of infection. 8/4; patient presents for 2-week follow-up. He has no issues or complaints today. He denies signs of infection. 8/18; patient presents for 2-week follow-up. He has no issues or concerns today. He has been approved for Apligraf and he would like to have this placed today. He denies signs of infection and reports that his right posterior knee wound is closed. 8/25; patient presents for 1 week follow-up. He had no issues with his first Apligraf placement last week. He did however develop an abscess to the back of his right knee. He reports tenderness to this area.  He denies systemic signs of infection. 9/1; patient presents for 1 week follow-up. He has had no issues with his plantar foot wound. The Apligraf has stayed in place over the past week. A wound culture was done at last clinic visit and a new antibiotic was sent to the pharmacy. Patient has not received this yet. He reports some mild tenderness to the back of his right knee. He denies systemic signs of infection. 9/8; patient presents for 1 week follow-up. He states he took Keflex and had diarrhea as a side effect. He was able to complete the course. He reports continued tenderness to the right posterior knee wound. He has some drainage still. He denies increased warmth or erythema to the area. He denies fever/chills or nausea/vomiting. He has no issues or complaints today about his plantar  foot wound. 9/15; patient presents for follow-up. He reports improvement in pain to the back of his right leg. He does not report any drainage. He had an ultrasound completed on 9/9 that showed a superficial fluid collection concerning for abscess. We attempted to call patient with results with no answer. We did put an urgent referral to general surgery and he reports he has not received a call. He currently denies systemic signs of infection. He reports no issues to his left plantar foot wound. 9/27; patient presents for follow-up. He has no issues or complaints today. He has had no issues with the previous wound to the back of his right leg. He has not made an appointment to see general surgery. He had Apligraf placed at last clinic visit to the plantar foot wound. He currently denies signs of infection. 10/7; patient presents for follow-up. Upon entering the room there is vomit throughout the entire floor. Patient states he does not feel well. I recommended he go to urgent care. I recommended following up next week for wound care. 10/10; patient presents for follow-up. He came last week for his follow-up however he was ill and started vomiting in the exam room. Today he states he feels better however he continues to have vomiting episodes. He states he attempted to go to urgent care/the ED but could not find a parking spot. He subsequently went home. He has no issues or complaints today regarding the foot wound. He never followed up with general surgery for the abscess identified on ultrasound to the back of his right knee. 10/20; patient presents for follow-up. He has no issues or complaints today. Overall he feels well today. 10/27; patient presents for follow-up. He has no issues or complaints today. 11/3; patient presents for follow-up. He has no issues or complaints today. He states he does not want to follow-up with orthopedics to reevaluate the abscess in the back of his right leg. He  denies signs of infection. 11/15; patient presents for follow-up. He has no issues or complaints today. He had been using silver alginate to the left first met head wound. He denies signs of infection. 11/22; patient presents for follow-up. He had Affinity placed at last clinic visit. He has no issues or complaints today. 11/29; patient presents for follow-up. He is in good spirits today. He has no issues or complaints today. 12/6; patient presents for follow-up. He has no issues or complaints today. 12/13; patient presents for follow-up. He denies signs of infection. He has no issues or complaints today. 12/20; patient presents for follow-up. He has no issues or complaints today. He denies signs of infection. 12/29; patient presents for follow-up. He states  that a few days ago he has had reaccumulation of an abscess behind the right knee. He denies increased warmth or erythema to the area. He denies fever/chills or drainage. Electronic Signature(s) Signed: 11/20/2021 1:27:25 PM By: Kalman Shan DO Entered By: Kalman Shan on 11/20/2021 13:21:55 -------------------------------------------------------------------------------- Physical Exam Details Patient Name: Date of Service: [name] [name], [name] 11/20/2021 1:00 PM Medical Record Number: [id number] Patient Account Number: [id number] Date of Birth/Sex: Treating RN: 15-Oct-1971 (50 y.o. [name] Primary Care Provider: Other Clinician: [name] Referring Provider: Treating Provider/Extender: Delano Metz in Treatment: 47 Constitutional respirations regular, non-labored and within target range for patient.. Cardiovascular 2+ dorsalis pedis/posterior tibialis pulses. Psychiatric pleasant and cooperative. Notes Left leg: T the first met head there is an open wound with granulation tissue, circumferential callus and nonviable tissue. No signs of infection o Posterior right knee: Collection  of fluid behind the skin palpable on exam. No drainage noted. No open wound. Electronic Signature(s) Signed: 11/20/2021 1:27:25 PM By: Kalman Shan DO Entered By: Kalman Shan on 11/20/2021 13:22:47 -------------------------------------------------------------------------------- Physician Orders Details Patient Name: Date of Service: [name] [name], [name] 11/20/2021 1:00 PM Medical Record Number: [id number] Patient Account Number: [id number] Date of Birth/Sex: Treating RN: Sep 07, 1971 (50 y.o. [name] Primary Care Provider: [name] Other Clinician: Referring Provider: Treating Provider/Extender: Delano Metz in Treatment: 49 Verbal / Phone Orders: No Diagnosis Coding ICD-10 Coding Code Description [phone] Non-pressure chronic ulcer of other part of left foot with fat layer exposed E11.621 Type 2 diabetes mellitus with foot ulcer E11.42 Type 2 diabetes mellitus with diabetic polyneuropathy Z89.511 Acquired absence of right leg below knee Follow-up Appointments ppointment in 1 week. - with Dr. Heber Pandora Return A Other: - **CALL [facility] TO SCHEDULE APPT TO HAVE ABSCESS DRAINED [phone]** Cellular or Tissue Based Products Cellular or Tissue Based Product Type: - Affinity #7 Cellular or Tissue Based Product applied to wound bed, secured with steri-strips, cover with Adaptic or Mepitel. (DO NOT REMOVE). Edema Control - Lymphedema / SCD / Other Elevate legs to the level of the heart or above for 30 minutes daily and/or when sitting, a frequency of: - throughout the day Avoid standing for long periods of time. Off-Loading Other: - minimize walking on left foot related to pressure to wound. Aid in wound healing. Wound Treatment Wound #2R - Metatarsal head first Wound Laterality: Left Cleanser: Soap and Water 1 x Per Week/15 Days Discharge Instructions: May shower and wash wound with dial antibacterial soap and water prior to  dressing change. Cleanser: Wound Cleanser (Generic) 1 x Per Week/15 Days Discharge Instructions: Cleanse the wound with wound cleanser prior to applying a clean dressing using gauze sponges, not tissue or cotton balls. Peri-Wound Care: Sween Lotion (Moisturizing lotion) 1 x Per Week/15 Days Discharge Instructions: Apply moisturizing lotion to leg Prim Dressing: Affinity ary 1 x Per Week/15 Days Secondary Dressing: Woven Gauze Sponges 2x2 in (Generic) 1 x Per Week/15 Days Discharge Instructions: Apply over primary dressing as directed. Secondary Dressing: Optifoam Non-Adhesive Dressing, 4x4 in (Generic) 1 x Per Week/15 Days Discharge Instructions: cut to make foam donut to help offload Secondary Dressing: ADAPTIC TOUCH 3x4.25 in 1 x Per Week/15 Days Discharge Instructions: Apply over primary dressing as directed. Secured With: [employer], 4.5x3.1 (in/yd) 1 x Per Week/15 Days Discharge Instructions: Secure with Kerlix as directed. Secured With: 92M Medipore H Soft Cloth Surgical Tape, 2x2 (in/yd) (Generic) 1 x Per Week/15 Days Discharge Instructions:  Secure dressing with tape as directed. [facility] - Recurrent abscess on right posterior knee - (ICD10 Z89.511 - Acquired absence of right leg below knee) Electronic Signature(s) Signed: 11/21/2021 8:18:59 AM By: Kalman Shan DO Signed: 11/21/2021 2:54:09 PM By: Levan Hurst RN, BSN Previous Signature: 11/20/2021 1:27:25 PM Version By: Kalman Shan DO Entered By: Levan Hurst on 11/20/2021 16:31:02 Prescription 11/20/2021 -------------------------------------------------------------------------------- [name] [name], [name] L. 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 11/20/2021 1:00 PM Medical Record Number: [id number] Patient Account Number: [id number] Date of Birth/Sex: Treating RN: Sep 24, 1971 (50 y.o. [name] Primary Care Provider: [name] Other Clinician: Referring Provider: Treating Provider/Extender: Delano Metz in Treatment: 88 Active Problems ICD-10 Encounter Code Description Active Date MDM Diagnosis [phone] Non-pressure chronic ulcer of other part of left foot with fat layer exposed 08/16/2020 No Yes E11.621 Type 2 diabetes mellitus with foot ulcer 08/16/2020 No Yes E11.42 Type 2 diabetes mellitus with diabetic polyneuropathy 08/16/2020 No Yes Z89.511 Acquired absence of right leg below knee 08/16/2020 No Yes Inactive Problems Resolved Problems ICD-10 Code Description Active Date Resolved Date S81.801D Unspecified open wound, right lower leg, subsequent encounter 04/08/2021 04/08/2021 Electronic Signature(s) Signed: 11/20/2021 1:27:25 PM By: Kalman Shan DO Entered By: Kalman Shan on 11/20/2021 13:21:05 -------------------------------------------------------------------------------- Progress Note Details Patient Name: Date of Service: [place] [name], [name] 11/20/2021 1:00 PM Medical Record Number: [id number] Patient Account Number: [id number] Date of Birth/Sex: Treating RN: 1971-06-27 (50 y.o. [name] Primary Care Provider: [name] Other  Clinician: Referring Provider: Treating Provider/Extender: Delano Metz in Treatment: 36 Subjective Chief Complaint Information obtained from Patient patient is here for a review of the wound on his plantar left first metatarsal head and back of his right leg History of Present Illness (HPI) 04/21/18 ADMISSION This is a 50 year old man who is a type II diabetic. In spite of fact his hemoglobin A1c is actually quite good 5.73 months ago he is at a really difficult time over the last 6-7 months. He developed a rapidly progressive infection in the right foot in November 2018 associated with osteomyelitis and necrotizing fasciitis. 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 The stump required and a revision on 11/24/17. The stump revision was advertised as being secondary to falls. I'm not sure the progressive history here however this area is actually closed over. The patient tells me over the same timeframe he has had wounds on the plantar aspect of his left foot. In the ED saw Dr. Sharol Given in April of this year. 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 It is really not clear that this patient is been dressing this with anything. He came in with the clinic without any specific dressing on the wound areas using his own tennis shoe. He tells me that he only ambulates of course to do a pivot  transfer. He does not have his prosthesis for the right leg as of yet and he blames Medicaid for this. He does however use the foot to push himself along in his wheelchair at home. The patient has not had formal arterial studies. ABI in our clinic on the left was 1.13. Patient's past medical history includes type 2 diabetes, fracture of the right fibula. I see that he was treated for abscesses on his right buttock and chest in 2016. I did not look at the microbiology of this. 04/28/18; patient comes back in the clinic today with the wound pretty much  the same as when he came in here last week. Small opening lots of undermining relatively. He tells [place] that nothing is really been on this for 3 days in spite of the fact that we gave him enough to dress this easily especially such a small wound. He says he lives with his mother, she is not capable of assisting with this he is changing the dressings himself. 05/05/18; much better-looking wound today. Smaller. There is some undermining medially however that's only perhaps 2 mm. No surrounding erythema. We've been using silver collagen 05/12/18; small wound on the first metatarsal head. No undermining no surrounding erythema. We've been using silver collagen he has home health coming out to change 05/20/18 the wound on the first metatarsal head looks better. Covered in surface debris/callus nonviable tissue. Required debridement but post debridement this looks quite good. We've been using silver collagen. He has home health 05/27/18; first metatarsal head wound continues to improve. Just about completely closed. Still a lot of surface debridement callus. We've been using silver collagen 06/06/18; the first metatarsal wound is completely healed over. There is still a lot of callus. I gently removed some of this just to make sure that there was no open area and there is not. The patient mentions to me that his left leg has felt like "lead" for about a week READMISSION 08/16/2020 This is a 50 year old man with type 2 diabetes. He returns to clinic today with a 1 month history of a blister and callus over the first metatarsal head. This is in exactly the same area as when he was here in 2019. 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 He has standard running shoes on the left foot to match the area in his prosthesis. He has only been covering this area with a Band-Aid has not really been specifically dressing this. ABI in our clinic on the left was 1.16. This is  essentially stable from the value in 2019 10/1 the patient has a linear wound over the left first metatarsal head. Again not a lot of callus thick skin around this that I removed with a #3 curette. This is not go deep to bone or muscle it does not look to be infected. 10/8 first metatarsal head. The wound looks like it is closing however this is going to be a difficult area to offload in the future. He has a size 15 shoe and he says his shoes are years old. The inserts in these current shoes are totally useless and I have advised him to replace these. He may need a new pair of shoes and I have he got original prosthesis at [facility] I have asked him to go back there. 11/9; the patient has not been here in more than a month. Not sure he was healed when he was  here the last time. I told him he would have to get new shoes and certainly offload the area over the left first metatarsal head. He has done nothing of the kind. He arrives back in clinic with the same old new balance sneakers. A very large separating callus over the first metatarsal head on the left 11/16; he has purchased new shoes and has at least 2 insoles like I asked. The wound is measuring much smaller. He is using silver alginate he changes the dressing himself 11/30; wound is measurably smaller in length of about a half a centimeter. This is generally very little depth. We have been using silver alginate. He changes the dressing himself every second day. 12/14; wound is about the same size. Significant undermining medially relative to the size of the wound. We have been using silver alginate. There is not a way to offload this area as he walks with a prosthesis on the right leg 12/28; wound is about the same size. Again he has undermining medially. I am using polymen on this 12/02/2020; the wound looks smaller. Still a senescent edge. We have been using polymen 1/24; the wound is come down a few millimeters. Still a senescent  edge. I have been using polymen 2/8; the wound is come down slightly. Still with slight undermining from 12-6 o'clock I have been using polymen partially to get offloading on this area which is opposed by his prosthesis on the other leg. 2/22 quite open improvement he still has a comma shaped wound on the left first metatarsal head. Some depth. Using polymen 3/14; he still has the same problem of a comma shaped area over the left first metatarsal head. This seems to have had more depth today at 0.6 cm. We have been using polymen. The patient changes this himself every second day. I explored the idea of a total contact cast on the left leg however this is his driving foot. He was not enamored with the idea of not being able to drive and states that he does not have anybody else to get him around 3/28; again he comes in with a smaller looking wound however there is depth and undermining requiring debridement. We have been using Hydrofera Blue, changed to silver collagen today. The patient is doing the dressing himself 4/11; patient presents today for follow-up of his left diabetic foot wound. He denies any issues in the past 2 weeks. He is currently using silver collagen every other day with dressing changes. He does not use diabetic shoes or special inserts to his sneakers. He does not use felt pads for offloading. 4/19; patient presents for his 1 week follow-up. He denies any issues. He reports using Hydrofera Blue and has not been using silver collagen for dressing changes. He reports minimal drainage to the wound. He overall feels well. 5/3; patient presents for 2-week follow-up. He has been using silver collagen without issues. He has no complaints today and states he overall feels well. 5/17; patient presents for 2-week follow-up. He states that the sleeve is rubbing up against the back of his right leg and causing a wound. He states this happened a week ago and has not been dressing the wound.  The plantar wound is stable and he has been using collagen every other day. 5/24; Patient was had a recent hospitalization for cellulitis of his right popliteal fossa. He was admitted and started on IV antibiotics. He does not recall the plantar wound dressed while inpatient. He feels a lot  better today. He was discharged on 5/21 on oral antibiotics and has not picked this up until today. He reports using collagen to the plantar wound since discharge. He is keeping the right posterior knee wound covered. He is getting a new sleeve for his prostatic on 5/26. He currently denies signs of infection. 6/1; patient presents for 1 week follow-up. He is still on doxycycline. He has not been dressing the back of the right knee wound. He states he is receiving his new prosthetic sleeve tomorrow. He states he is using silver alginate to the plantar foot wound. He currently denies signs of infection. He states he has not been offloading the left foot wound 6/7; patient presents for 1 week follow-up. He completes his last dose of doxycycline today. He finally received his prosthetic sleeve for the right side and he states it feels much better. He has been using antibiotic ointment to the back of that right knee wound. He has been using silver alginate to the plantar foot wound. He currently denies signs of infection. 6/21; patient presents for 2-week follow-up. He reports no issues with his prosthetic sleeve. He has been using antibiotic ointment to the back of the right knee. He has been using silver alginate to the plantar foot wound. He has no issues or complaints today. He denies signs of infection. 7/5; patient presents for 2-week follow-up. He reports no issues or complaints today. He continues to use antibiotic ointment to the right posterior knee wound and silver alginate to the left plantar foot wound. He denies signs of infection. 7/19; patient presents for 2-week follow-up. He continues to use  antibiotic ointment to the right posterior knee and silver alginate to the left plantar foot wound. He has no issues or complaints today. He denies signs of infection. 8/4; patient presents for 2-week follow-up. He has no issues or complaints today. He denies signs of infection. 8/18; patient presents for 2-week follow-up. He has no issues or concerns today. He has been approved for Apligraf and he would like to have this placed today. He denies signs of infection and reports that his right posterior knee wound is closed. 8/25; patient presents for 1 week follow-up. He had no issues with his first Apligraf placement last week. He did however develop an abscess to the back of his right knee. He reports tenderness to this area. He denies systemic signs of infection. 9/1; patient presents for 1 week follow-up. He has had no issues with his plantar foot wound. The Apligraf has stayed in place over the past week. A wound culture was done at last clinic visit and a new antibiotic was sent to the pharmacy. Patient has not received this yet. He reports some mild tenderness to the back of his right knee. He denies systemic signs of infection. 9/8; patient presents for 1 week follow-up. He states he took Keflex and had diarrhea as a side effect. He was able to complete the course. He reports continued tenderness to the right posterior knee wound. He has some drainage still. He denies increased warmth or erythema to the area. He denies fever/chills or nausea/vomiting. He has no issues or complaints today about his plantar foot wound. 9/15; patient presents for follow-up. He reports improvement in pain to the back of his right leg. He does not report any drainage. He had an ultrasound completed on 9/9 that showed a superficial fluid collection concerning for abscess. We attempted to call patient with results with no answer. We did put  an urgent referral to general surgery and he reports he has not received a  call. He currently denies systemic signs of infection. He reports no issues to his left plantar foot wound. 9/27; patient presents for follow-up. He has no issues or complaints today. He has had no issues with the previous wound to the back of his right leg. He has not made an appointment to see general surgery. He had Apligraf placed at last clinic visit to the plantar foot wound. He currently denies signs of infection. 10/7; patient presents for follow-up. Upon entering the room there is vomit throughout the entire floor. Patient states he does not feel well. I recommended he go to urgent care. I recommended following up next week for wound care. 10/10; patient presents for follow-up. He came last week for his follow-up however he was ill and started vomiting in the exam room. Today he states he feels better however he continues to have vomiting episodes. He states he attempted to go to urgent care/the ED but could not find a parking spot. He subsequently went home. He has no issues or complaints today regarding the foot wound. He never followed up with general surgery for the abscess identified on ultrasound to the back of his right knee. 10/20; patient presents for follow-up. He has no issues or complaints today. Overall he feels well today. 10/27; patient presents for follow-up. He has no issues or complaints today. 11/3; patient presents for follow-up. He has no issues or complaints today. He states he does not want to follow-up with orthopedics to reevaluate the abscess in the back of his right leg. He denies signs of infection. 11/15; patient presents for follow-up. He has no issues or complaints today. He had been using silver alginate to the left first met head wound. He denies signs of infection. 11/22; patient presents for follow-up. He had Affinity placed at last clinic visit. He has no issues or complaints today. 11/29; patient presents for follow-up. He is in good spirits today. He  has no issues or complaints today. 12/6; patient presents for follow-up. He has no issues or complaints today. 12/13; patient presents for follow-up. He denies signs of infection. He has no issues or complaints today. 12/20; patient presents for follow-up. He has no issues or complaints today. He denies signs of infection. 12/29; patient presents for follow-up. He states that a few days ago he has had reaccumulation of an abscess behind the right knee. He denies increased warmth or erythema to the area. He denies fever/chills or drainage. Patient History Information obtained from Patient. Family History Diabetes - Maternal Grandparents,Mother, Heart Disease - Father, Hypertension - Father, No family history of Cancer, Hereditary Spherocytosis, Kidney Disease, Lung Disease, Seizures, Stroke, Thyroid Problems, Tuberculosis. Social History Current every day smoker, Marital Status - Single, Alcohol Use - Never, Drug Use - No History, Caffeine Use - Never. Medical History Eyes Denies history of Cataracts, Glaucoma, Optic Neuritis Ear/Nose/Mouth/Throat Denies history of Chronic sinus problems/congestion, Middle ear problems Hematologic/Lymphatic Patient has history of Anemia Denies history of Hemophilia, Human Immunodeficiency Virus, Lymphedema, Sickle Cell Disease Respiratory Denies history of Aspiration, Asthma, Chronic Obstructive Pulmonary Disease (COPD), Pneumothorax, Sleep Apnea, Tuberculosis Cardiovascular Patient has history of Hypertension, Peripheral Venous Disease Denies history of Angina, Arrhythmia, Congestive Heart Failure, Coronary Artery Disease, Deep Vein Thrombosis, Hypotension, Myocardial Infarction, Peripheral Arterial Disease, Phlebitis, Vasculitis Gastrointestinal Denies history of Cirrhosis , Colitis, Crohnoos, Hepatitis A, Hepatitis B, Hepatitis C Endocrine Patient has history of Type II Diabetes -  oral meds Denies history of Type I Diabetes Genitourinary Denies  history of End Stage Renal Disease Immunological Denies history of Lupus Erythematosus, Raynaudoos, Scleroderma Integumentary (Skin) Denies history of History of Burn Musculoskeletal Denies history of Gout, Rheumatoid Arthritis, Osteoarthritis, Osteomyelitis Neurologic Denies history of Dementia, Neuropathy, Quadriplegia, Paraplegia, Seizure Disorder Oncologic Denies history of Received Chemotherapy, Received Radiation Psychiatric Denies history of Anorexia/bulimia, Confinement Anxiety Hospitalization/Surgery History - fever 105, sepsis cellulitis right popliteal fossa 04/09/2021. Medical A Surgical History Notes [name] Constitutional Symptoms (General Health) falls , leukocytosis Respiratory During waking hours and catches himself not breathing, "gasp for air". Saw Dr Wynonia Lawman, he couldn't find a reason Objective Constitutional respirations regular, non-labored and within target range for patient.. Vitals Time Taken: 12:44 PM, Height: 77 in, Weight: 232 lbs, BMI: 27.5, Temperature: 97.9 F, Pulse: 93 bpm, Respiratory Rate: 16 breaths/min, Blood Pressure: 143/81 mmHg, Capillary Blood Glucose: 205 mg/dl. General Notes: glucose per pt report Cardiovascular 2+ dorsalis pedis/posterior tibialis pulses. Psychiatric pleasant and cooperative. General Notes: Left leg: T the first met head there is an open wound with granulation tissue, circumferential callus and nonviable tissue. No signs of infection o Posterior right knee: Collection of fluid behind the skin palpable on exam. No drainage noted. No open wound. Integumentary (Hair, Skin) Wound #2R status is Open. Original cause of wound was Blister. The date acquired was: 06/23/2020. The wound has been in treatment 65 weeks. The wound is located on the Left Metatarsal head first. The wound measures 0.8cm length x 0.1cm width x 0.5cm depth; 0.063cm^2 area and 0.031cm^3 volume. There is Fat Layer (Subcutaneous Tissue) exposed. There is no  tunneling noted, however, there is undermining starting at 6:00 and ending at 12:00 with a maximum distance of 0.3cm. There is a medium amount of serosanguineous drainage noted. The wound margin is thickened. There is large (67-100%) red granulation within the wound bed. There is no necrotic tissue within the wound bed. 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 I debrided nonviable tissue. Affinity #7 placed in standard fashion today. No signs of infection on exam. Patient has reaccumulated an abscess to the posterior right knee. He has a history of abscess to this area. I recommended he follow-up with [facility] for this issue. He expressed understanding. He knows to go to urgent care if symptoms worsen. Procedures Wound #2R Pre-procedure diagnosis of Wound #2R is a Diabetic Wound/Ulcer of the Lower Extremity located on the Left Metatarsal head first .Severity of Tissue Pre Debridement is: Fat layer exposed. There was a Excisional Skin/Subcutaneous Tissue Debridement with a total area of 0.08 sq cm performed by Kalman Shan, DO. With the following instrument(s): Curette to remove Viable and Non-Viable tissue/material. Material removed includes Callus and Subcutaneous Tissue and. No specimens were taken. A time out was conducted at 13:05, prior to the start of the procedure. A Minimum amount of bleeding was controlled with Pressure. The procedure was tolerated well with a pain level of 0 throughout and a pain level of 0 following the procedure. Post Debridement Measurements: 0.8cm length x 0.1cm width x 0.5cm depth; 0.031cm^3 volume. Character of Wound/Ulcer Post Debridement is improved. Severity of Tissue Post Debridement is: Fat layer exposed. Post  procedure Diagnosis Wound #2R: Same as Pre-Procedure Pre-procedure diagnosis of Wound #2R is a Diabetic Wound/Ulcer of  the Lower Extremity located on the Left Metatarsal head first. A skin graft procedure using a bioengineered skin substitute/cellular or tissue based product was performed by Kalman Shan, DO with the following instrument(s): N/A. Affinity was applied and secured with Steri-Strips. 3 sq cm of product was utilized and 0 sq cm was wasted. Post Application, gauze, kerlix was applied. A Time Out was conducted at 13:10, prior to the start of the procedure. The procedure was tolerated well with a pain level of 0 throughout and a pain level of 0 following the procedure. Post procedure Diagnosis Wound #2R: Same as Pre-Procedure . Plan 1. In office sharp debridement 2. Affinity #7 placed in standard fashion 3. Follow-up in 1 week 4. Follow-up with [facility] Electronic Signature(s) Signed: 11/20/2021 2:08:35 PM By: Kalman Shan DO Signed: 11/21/2021 2:54:09 PM By: Levan Hurst RN, BSN Previous Signature: 11/20/2021 1:27:25 PM Version By: Kalman Shan DO Entered By: Levan Hurst on 11/20/2021 13:38:20 -------------------------------------------------------------------------------- HxROS Details Patient Name: Date of Service: [name] [name], [name] 11/20/2021 1:00 PM Medical Record Number: [id number] Patient Account Number: [id number] Date of Birth/Sex: Treating RN: Apr 14, 1971 (50 y.o. [name] Primary Care Provider: [name] Other Clinician: Referring Provider: Treating Provider/Extender: Delano Metz in Treatment: 59 Information Obtained From Patient Constitutional Symptoms (General Health) Medical History: Past Medical History Notes: falls , leukocytosis Eyes Medical History: Negative for: Cataracts; Glaucoma; Optic Neuritis Ear/Nose/Mouth/Throat Medical History: Negative for: Chronic sinus problems/congestion;  Middle ear problems Hematologic/Lymphatic Medical History: Positive for: Anemia Negative for: Hemophilia; Human Immunodeficiency Virus; Lymphedema; Sickle Cell Disease Respiratory Medical History: Negative for: Aspiration; Asthma; Chronic Obstructive Pulmonary Disease (COPD); Pneumothorax; Sleep Apnea; Tuberculosis Past Medical History Notes: During waking hours and catches himself not breathing, "gasp for air". Saw Dr Wynonia Lawman, he couldn't find a reason Cardiovascular Medical History: Positive for: Hypertension; Peripheral Venous Disease Negative for: Angina; Arrhythmia; Congestive Heart Failure; Coronary Artery Disease; Deep Vein Thrombosis; Hypotension; Myocardial Infarction; Peripheral Arterial Disease; Phlebitis; Vasculitis Gastrointestinal Medical History: Negative for: Cirrhosis ; Colitis; Crohns; Hepatitis A; Hepatitis B; Hepatitis C Endocrine Medical History: Positive for: Type II Diabetes - oral meds Negative for: Type I Diabetes Time with diabetes: 2014 Treated with: Oral agents Blood sugar tested every day: Yes Tested : Blood sugar testing results: Breakfast: 91 Genitourinary Medical History: Negative for: End Stage Renal Disease Immunological Medical History: Negative for: Lupus Erythematosus; Raynauds; Scleroderma Integumentary (Skin) Medical History: Negative for: History of Burn Musculoskeletal Medical History: Negative for: Gout; Rheumatoid Arthritis; Osteoarthritis; Osteomyelitis Neurologic Medical History: Negative for: Dementia; Neuropathy; Quadriplegia; Paraplegia; Seizure Disorder Oncologic Medical History: Negative for: Received Chemotherapy; Received Radiation Psychiatric Medical History: Negative for: Anorexia/bulimia; Confinement Anxiety Immunizations Pneumococcal Vaccine: Received Pneumococcal Vaccination: No Implantable Devices No devices added Hospitalization / Surgery History Type of Hospitalization/Surgery fever 105, sepsis  cellulitis right popliteal fossa 04/09/2021 Family and Social History Cancer: No; Diabetes: Yes - Maternal Grandparents,Mother; Heart Disease: Yes - Father; Hereditary Spherocytosis: No; Hypertension: Yes - Father; Kidney Disease: No; Lung Disease: No; Seizures: No; Stroke: No; Thyroid Problems: No; Tuberculosis: No; Current every day smoker; Marital Status - Single; Alcohol Use: Never; Drug Use: No History; Caffeine Use: Never; Financial Concerns: No; Food, Clothing or Shelter Needs: No; Support System Lacking: No; Transportation Concerns: No Electronic Signature(s) Signed: 11/20/2021 1:27:25 PM By: Kalman Shan DO Signed: 11/21/2021 2:54:09 PM By: Levan Hurst RN, BSN Entered By: Kalman Shan on 11/20/2021 13:22:03 -------------------------------------------------------------------------------- [place] Details Patient Name: Date of Service: [name] [name], [name] 11/20/2021 Medical Record Number:  [id number] Patient Account Number: [id number] Date of Birth/Sex: Treating RN: July 28, 1971 (50 y.o. 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 Facility Procedures CPT4 Code: [id number] Description: 40102 - SKIN SUB GRAFT FACE/NK/HF/G ICD-10 Diagnosis Description L97.522 Non-pressure chronic ulcer of other part of left foot with fat layer exposed E11.621 Type 2 diabetes mellitus with foot ulcer Modifier: Quantity: 1 CPT4 Code: [id number] Description: I3474 AFFINITY, 1.5X 1.5 SQ CM Modifier: Quantity: 3 Physician Procedures : CPT4 Code Description Modifier  [id number] - WC PHYS SKIN SUB GRAFT FACE/NK/HF/G ICD-10 Diagnosis Description L97.522 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

## 2021-11-21 NOTE — Progress Notes (Signed)
USHER, HEDBERG ([id number]) Visit Report for 11/20/2021 Arrival Information Details Patient Name: Date of Service: [name] [name] [name] 11/20/2021 1:00 PM Medical Record Number: [id number] Patient Account Number: [id number] Date of Birth/Sex: Treating RN: 1971/01/13 (50 y.o. [name] Primary Care Oriah Leinweber: [name] Other Clinician: Referring Aliyyah Riese: Treating Clovis Warwick/Extender: Delano Metz in Treatment: 45 Visit Information History Since Last Visit Added or deleted any medications: No Patient Arrived: Ambulatory Any new allergies or adverse reactions: No Arrival Time: 12:44 Had a fall or experienced change in No Accompanied By: alone activities of daily living that may affect Transfer Assistance: None risk of falls: Patient Identification Verified: Yes Signs or symptoms of abuse/neglect since last visito No Secondary Verification Process Completed: Yes Hospitalized since last visit: No Patient Requires Transmission-Based Precautions: No Implantable device outside of the clinic excluding No Patient Has Alerts: No cellular tissue based products placed in the center since last visit: Has Dressing in Place as Prescribed: Yes Pain Present Now: No Electronic Signature(s) Signed: 11/21/2021 2:54:09 PM By: Levan Hurst RN, BSN Entered By: Levan Hurst on 11/20/2021 12:44:52 -------------------------------------------------------------------------------- Multi Wound Chart Details Patient Name: Date of Service: [name] Shire ND, [name] 11/20/2021 1:00 PM Medical Record Number: [id number] Patient Account Number: [id number] Date of Birth/Sex: Treating RN: 12-08-1970 (50 y.o. [name] Primary Care Chamberlain Steinborn: [name] Other Clinician: Referring Doak Mah: Treating Kacy Hegna/Extender: Delano Metz in Treatment: 37 Vital Signs Height(in): 77 Capillary Blood Glucose(mg/dl): 205 Weight(lbs):  232 Pulse(bpm): 93 Body Mass Index(BMI): 28 Blood Pressure(mmHg): 143/81 Temperature(F): 97.9 Respiratory Rate(breaths/min): 16 Photos: [2R:Left Metatarsal head first] [N/A:N/A N/A] Wound Location: [2R:Blister] [N/A:N/A] Wounding Event: [2R:Diabetic Wound/Ulcer of the Lower] [N/A:N/A] Primary Etiology: [2R:Extremity Anemia, Hypertension, Peripheral] [N/A:N/A] Comorbid History: [2R:Venous Disease, Type II Diabetes 06/23/2020] [N/A:N/A] Date Acquired: [2R:65] [N/A:N/A] Weeks of Treatment: [2R:Open] [N/A:N/A] Wound Status: [2R:Yes] [N/A:N/A] Wound Recurrence: [2R:0.8x0.1x0.5] [N/A:N/A] Measurements L x W x D (cm) [2R:0.063] [N/A:N/A] A (cm) : rea [2R:0.031] [N/A:N/A] Volume (cm) : [2R:-103.20%] [N/A:N/A] % Reduction in A [2R:rea: -933.30%] [N/A:N/A] % Reduction in Volume: [2R:6] Starting Position 1 (o'clock): [2R:12] Ending Position 1 (o'clock): [2R:0.3] Maximum Distance 1 (cm): [2R:Yes] [N/A:N/A] Undermining: [2R:Grade 2] [N/A:N/A] Classification: [2R:Medium] [N/A:N/A] Exudate A mount: [2R:Serosanguineous] [N/A:N/A] Exudate Type: [2R:red, brown] [N/A:N/A] Exudate Color: [2R:Thickened] [N/A:N/A] Wound Margin: [2R:Large (67-100%)] [N/A:N/A] Granulation A mount: [2R:Red] [N/A:N/A] Granulation Quality: [2R:None Present (0%)] [N/A:N/A] Necrotic A mount: [2R:Fat Layer (Subcutaneous Tissue): Yes N/A] Exposed Structures: [2R:Fascia: No Tendon: No Muscle: No Joint: No Bone: No Medium (34-66%)] [N/A:N/A] Epithelialization: [2R:Debridement - Excisional] [N/A:N/A] Debridement: Pre-procedure Verification/Time Out 13:05 [N/A:N/A] Taken: [2R:Callus, Subcutaneous] [N/A:N/A] Tissue Debrided: [2R:Skin/Subcutaneous Tissue] [N/A:N/A] Level: [2R:0.08] [N/A:N/A] Debridement A (sq cm): [2R:rea Curette] [N/A:N/A] Instrument: [2R:Minimum] [N/A:N/A] Bleeding: [2R:Pressure] [N/A:N/A] Hemostasis A chieved: [2R:0] [N/A:N/A] Procedural Pain: [2R:0] [N/A:N/A] Post Procedural Pain: [2R:Procedure was  tolerated well] [N/A:N/A] Debridement Treatment Response: [2R:0.8x0.1x0.5] [N/A:N/A] Post Debridement Measurements L x W x D (cm) [2R:0.031] [N/A:N/A] Post Debridement Volume: (cm) [2R:Debridement] [N/A:N/A] Treatment Notes Electronic Signature(s) Signed: 11/20/2021 1:27:25 PM By: Kalman Shan DO Signed: 11/21/2021 2:54:09 PM By: Levan Hurst RN, BSN Entered By: Kalman Shan on 11/20/2021 13:21:11 -------------------------------------------------------------------------------- Multi-Disciplinary Care Plan Details Patient Name: Date of Service: [name] Shire ND, [name] 11/20/2021 1:00 PM Medical Record Number: [id number] Patient Account Number: [id number] Date of Birth/Sex: Treating RN: 09-Mar-1971 (50 y.o. [name] Primary Care Elsi Stelzer: [name] Other Clinician: Referring Merari Pion: Treating Otniel Hoe/Extender: Delano Metz in Treatment: 45 Multidisciplinary Care Plan reviewed with physician Active  Inactive Abuse / Safety / Falls / Self Care Management Nursing Diagnoses: History of Falls Goals: Patient/caregiver will verbalize/demonstrate measures taken to prevent injury and/or falls Date Initiated: 11/04/2021 Target Resolution Date: 12/02/2021 Goal Status: Active Interventions: Assess fall risk on admission and as needed Assess impairment of mobility on admission and as needed per policy Notes: Wound/Skin Impairment Nursing Diagnoses: Impaired tissue integrity Goals: Patient/caregiver will verbalize understanding of skin care regimen Date Initiated: 11/19/2020 Target Resolution Date: 12/05/2021 Goal Status: Active Ulcer/skin breakdown will have a volume reduction of 30% by week 4 Date Initiated: 08/16/2020 Date Inactivated: 03/03/2021 Target Resolution Date: 03/10/2021 Goal Status: Met Interventions: Provide education on ulcer and skin care Notes: 07/24/21: Wound care regimen ongoing. Electronic Signature(s) Signed:  11/21/2021 2:54:09 PM By: Levan Hurst RN, BSN Entered By: Levan Hurst on 11/20/2021 12:58:20 -------------------------------------------------------------------------------- Pain Assessment Details Patient Name: Date of Service: [name] ND, [name] 11/20/2021 1:00 PM Medical Record Number: [id number] Patient Account Number: [id number] Date of Birth/Sex: Treating RN: 08-Nov-1971 (50 y.o. [name] Primary Care Lezly Rumpf: [name] Other Clinician: Referring Adem Costlow: Treating Lakasha Mcfall/Extender: Delano Metz in Treatment: 14 Active Problems Location of Pain Severity and Description of Pain Patient Has Paino Yes Site Locations Pain Location: Pain Location: Pain in Ulcers Rate the pain. Current Pain Level: 6 Character of Pain Describe the Pain: Tender, Throbbing Pain Management and Medication Current Pain Management: Medication: Yes Cold Application: No Rest: No Massage: No Activity: No T.E.N.S.: No Heat Application: No Leg drop or elevation: No Is the Current Pain Management Adequate: Adequate How does your wound impact your activities of daily livingo Sleep: No Bathing: No Appetite: No Relationship With Others: No Bladder Continence: No Emotions: No Bowel Continence: No Work: No Toileting: No Drive: No Dressing: No Hobbies: No Electronic Signature(s) Signed: 11/21/2021 2:54:09 PM By: Levan Hurst RN, BSN Entered By: Levan Hurst on 11/20/2021 12:47:16 -------------------------------------------------------------------------------- Patient/Caregiver Education Details Patient Name: Date of Service: [name] Shire ND, Payton Doughty 12/29/2022andnbsp1:00 PM Medical Record Number: [id number] Patient Account Number: [id number] Date of Birth/Gender: Treating RN: 12/02/1970 (50 y.o. [name] Primary Care Physician: [name] Other Clinician: Referring Physician: Treating Physician/Extender: Delano Metz in Treatment: 20 Education Assessment Education Provided To: Patient Education Topics Provided Wound/Skin Impairment: Methods: Explain/Verbal Responses: State content correctly [employer]) Signed: 11/21/2021 2:54:09 PM By: Levan Hurst RN, BSN Entered By: Levan Hurst on 11/20/2021 12:58:30 -------------------------------------------------------------------------------- Wound Assessment Details Patient Name: Date of Service: [name] ND, [name] 11/20/2021 1:00 PM Medical Record Number: [id number] Patient Account Number: [id number] Date of Birth/Sex: Treating RN: February 08, 1971 (50 y.o. [name] Primary Care Ghali Morissette: [name] Other Clinician: Referring Erminio Nygard: Treating Eliani Leclere/Extender: Delano Metz in Treatment: 58 Wound Status Wound Number: 2R Primary Diabetic Wound/Ulcer of the Lower Extremity Etiology: Wound Location: Left Metatarsal head first Wound Status: Open Wounding Event: Blister Comorbid Anemia, Hypertension, Peripheral Venous Disease, Type II Date Acquired: 06/23/2020 History: Diabetes Weeks Of Treatment: 65 Clustered Wound: No Photos Wound Measurements Length: (cm) 0.8 Width: (cm) 0.1 Depth: (cm) 0.5 Area: (cm) 0.063 Volume: (cm) 0.031 % Reduction in Area: -103.2% % Reduction in Volume: -933.3% Epithelialization: Medium (34-66%) Tunneling: No Undermining: Yes Starting Position (o'clock): 6 Ending Position (o'clock): 12 Maximum Distance: (cm) 0.3 Wound Description Classification: Grade 2 Wound Margin: Thickened Exudate Amount: Medium Exudate Type: Serosanguineous Exudate Color: red, brown Foul Odor After Cleansing: No Slough/Fibrino No Wound Bed Granulation Amount: Large (67-100%) Exposed Structure Granulation Quality: Red Fascia Exposed:  No Necrotic Amount: None Present (0%) Fat Layer (Subcutaneous Tissue) Exposed: Yes Tendon Exposed: No Muscle  Exposed: No Joint Exposed: No Bone Exposed: No Electronic Signature(s) Signed: 11/21/2021 2:54:09 PM By: Levan Hurst RN, BSN Entered By: Levan Hurst on 11/20/2021 12:54:02 -------------------------------------------------------------------------------- [place] Details Patient Name: Date of Service: [name] ND, [name] 11/20/2021 1:00 PM Medical Record Number: [id number] Patient Account Number: [id number] Date of Birth/Sex: Treating RN: 04-Apr-1971 (50 y.o. [name] Primary Care [place] Silveria: [name] Other Clinician: Referring Seara Hinesley: Treating Rennee Coyne/Extender: Delano Metz in Treatment: 70 Vital Signs Time Taken: 12:44 Temperature (F): 97.9 Height (in): 77 Pulse (bpm): 93 Weight (lbs): 232 Respiratory Rate (breaths/min): 16 Body Mass Index (BMI): 27.5 Blood Pressure (mmHg): 143/81 Capillary Blood Glucose (mg/dl): 205 Reference Range: 80 - 120 mg / dl Notes glucose per pt report Electronic Signature(s) Signed: 11/21/2021 2:54:09 PM By: Levan Hurst RN, BSN Entered By: Levan Hurst on 11/20/2021 12:46:17

## 2021-11-26 ENCOUNTER — Encounter (HOSPITAL_COMMUNITY): Payer: Self-pay

## 2021-11-27 ENCOUNTER — Encounter (HOSPITAL_BASED_OUTPATIENT_CLINIC_OR_DEPARTMENT_OTHER): Payer: Medicare Other | Admitting: Internal Medicine

## 2021-11-28 ENCOUNTER — Other Ambulatory Visit: Payer: Self-pay

## 2021-11-28 ENCOUNTER — Encounter (HOSPITAL_BASED_OUTPATIENT_CLINIC_OR_DEPARTMENT_OTHER): Payer: Medicare Other | Attending: Internal Medicine | Admitting: Internal Medicine

## 2021-11-28 DIAGNOSIS — F17200 Nicotine dependence, unspecified, uncomplicated: Secondary | ICD-10-CM | POA: Insufficient documentation

## 2021-11-28 DIAGNOSIS — E11621 Type 2 diabetes mellitus with foot ulcer: Secondary | ICD-10-CM | POA: Insufficient documentation

## 2021-11-28 DIAGNOSIS — Z89511 Acquired absence of right leg below knee: Secondary | ICD-10-CM | POA: Diagnosis not present

## 2021-11-28 DIAGNOSIS — L97522 Non-pressure chronic ulcer of other part of left foot with fat layer exposed: Secondary | ICD-10-CM | POA: Insufficient documentation

## 2021-11-28 DIAGNOSIS — E1142 Type 2 diabetes mellitus with diabetic polyneuropathy: Secondary | ICD-10-CM | POA: Diagnosis not present

## 2021-12-02 NOTE — Progress Notes (Signed)
[name] [name] ([id number]) Visit Report for 11/28/2021 Chief Complaint Document Details Patient Name: Date of Service: [name] [name] 11/28/2021 9:15 A M Medical Record Number: [id number] Patient Account Number: [id number] Date of Birth/Sex: Treating RN: April 30, 1971 (51 y.o. [name] [name] Primary Care Provider: Ferd [name] Other Clinician: Referring Provider: Treating Provider/Extender: [name] [name] in Treatment: 22 Information Obtained from: Patient Chief Complaint patient is here for a review of the wound on his plantar left first metatarsal head and back of his right leg Electronic Signature(s) Signed: 11/28/2021 11:44:02 AM By: Kalman Shan DO Entered By: Kalman Shan on 11/28/2021 11:37:04 -------------------------------------------------------------------------------- Cellular or Tissue Based Product Details Patient Name: Date of Service: [name] [name] 11/28/2021 9:15 A M Medical Record Number: [id number] Patient Account Number: [id number] Date of Birth/Sex: Treating RN: 01/14/1971 (51 y.o. [name] [name] Primary Care Provider: Ferd [name] Other Clinician: Referring Provider: Treating Provider/Extender: [name] [name] in Treatment: 64 Cellular or Tissue Based Product Type Wound #2R Left Metatarsal head first Applied to: Performed By: Physician Kalman Shan, DO Cellular or Tissue Based Product Type: Affinity Level of Consciousness (Pre-procedure): Awake and Alert Pre-procedure Verification/Time Out Yes - 09:55 Taken: Location: genitalia / hands / feet / multiple digits Wound Size (sq cm): 0.07 Product Size (sq cm): 3 Waste Size (sq cm): 0 Amount of Product Applied (sq cm): 3 Instrument Used: Forceps Lot #: [id number] Order #: [id number] Expiration Date: 12/03/2021 Fenestrated: No Reconstituted: No Secured: Yes Secured With: Steri-Strips Dressing Applied: Yes Primary Dressing:  adaptic, gauze, foam Procedural Pain: 0 Post Procedural Pain: 0 Response to Treatment: Procedure was tolerated well Level of Consciousness (Post- Awake and Alert procedure): Post Procedure Diagnosis Same as Pre-procedure Electronic Signature(s) Signed: 11/28/2021 11:36:31 AM By: Baruch Gouty RN, BSN Signed: 11/28/2021 11:44:02 AM By: Kalman Shan DO Entered By: Baruch Gouty on 11/28/2021 10:00:17 -------------------------------------------------------------------------------- Debridement Details Patient Name: Date of Service: [name] Shire ND, [name] 11/28/2021 9:15 A M Medical Record Number: [id number] Patient Account Number: [id number] Date of Birth/Sex: Treating RN: [birth year] (51 y.o. [name] [name] Primary Care Provider: Ferd [name] Other Clinician: Referring Provider: Treating Provider/Extender: [name] [name] in Treatment: 67 Debridement Performed for Assessment: Wound #2R Left Metatarsal head first Performed By: Physician Kalman Shan, DO Debridement Type: Debridement Severity of Tissue Pre Debridement: Fat layer exposed Level of Consciousness (Pre-procedure): Awake and Alert Pre-procedure Verification/Time Out Yes - 09:50 Taken: Start Time: 09:50 Pain Control: Other : benzocaine 20% spray T Area Debrided (L x W): otal 0.7 (cm) x 0.2 (cm) = 0.14 (cm) Tissue and other material debrided: Viable, Non-Viable, Callus, Subcutaneous, Skin: Epidermis Level: Skin/Subcutaneous Tissue Debridement Description: Excisional Instrument: Curette Bleeding: Minimum Hemostasis Achieved: Pressure Procedural Pain: 0 Post Procedural Pain: 0 Response to Treatment: Procedure was tolerated well Level of Consciousness (Post- Awake and Alert procedure): Post Debridement Measurements of Total Wound Length: (cm) 0.7 Width: (cm) 0.2 Depth: (cm) 0.6 Volume: (cm) 0.066 Character of Wound/Ulcer Post Debridement: Improved Severity of Tissue Post  Debridement: Fat layer exposed Post Procedure Diagnosis Same as Pre-procedure Electronic Signature(s) Signed: 11/28/2021 11:36:31 AM By: Baruch Gouty RN, BSN Signed: 11/28/2021 11:44:02 AM By: Kalman Shan DO Entered By: Baruch Gouty on 11/28/2021 09:53:59 -------------------------------------------------------------------------------- HPI Details Patient Name: Date of Service: [name] Shire ND, [name] 11/28/2021 9:15 A M Medical Record Number: [id number] Patient Account Number: [id number] Date of Birth/Sex: Treating RN: Jul 30, 1971 (51 y.o. [name] [name] Primary Care Provider: Ferd [name] Other Clinician: Referring  Provider: Treating Provider/Extender: [name] [name] in Treatment: 80 History of Present Illness HPI Description: 04/21/18 ADMISSION This is a 51 year old man who is a type II diabetic. In spite of fact his hemoglobin A1c is actually quite good 5.73 months ago he is at a really difficult time over the last 6-7 months. He developed a rapidly progressive infection in the right foot in November 2018 associated with osteomyelitis and necrotizing fasciitis. 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 The stump required and a revision on 11/24/17. The stump revision was advertised as being secondary to falls. I'm not sure the progressive history here however this area is actually closed over. The patient tells me over the same timeframe he has had wounds on the plantar aspect of his left foot. In the ED saw Dr. Sharol Given in April of this year. 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 It is really not clear that this patient is been dressing this with anything. He came in with the clinic without any specific dressing on the wound areas using his own tennis shoe. He tells me that he only ambulates of course to do a pivot transfer. He does not have his prosthesis for the right leg as of yet and he blames Medicaid for  this. He does however use the foot to push himself along in his wheelchair at home. The patient has not had formal arterial studies. ABI in our clinic on the left was 1.13. Patient's past medical history includes type 2 diabetes, fracture of the right fibula. I see that he was treated for abscesses on his right buttock and chest in 2016. I did not look at the microbiology of this. 04/28/18; patient comes back in the clinic today with the wound pretty much the same as when he came in here last week. Small opening lots of undermining relatively. He tells [place] that nothing is really been on this for 3 days in spite of the fact that we gave him enough to dress this easily especially such a small wound. He says he lives with his mother, she is not capable of assisting with this he is changing the dressings himself. 05/05/18; much better-looking wound today. Smaller. There is some undermining medially however that's only perhaps 2 mm. No surrounding erythema. We've been using silver collagen 05/12/18; small wound on the first metatarsal head. No undermining no surrounding erythema. We've been using silver collagen he has home health coming out to change 05/20/18 the wound on the first metatarsal head looks better. Covered in surface debris/callus nonviable tissue. Required debridement but post debridement this looks quite good. We've been using silver collagen. He has home health 05/27/18; first metatarsal head wound continues to improve. Just about completely closed. Still a lot of surface debridement callus. We've been using silver collagen 06/06/18; the first metatarsal wound is completely healed over. There is still a lot of callus. I gently removed some of this just to make sure that there was no open area and there is not. The patient mentions to me that his left leg has felt like "lead" for about a week READMISSION 08/16/2020 This is a 51 year old man with type 2 diabetes. He returns to clinic today with a  1 month history of a blister and callus over the first metatarsal head. This is in exactly the same area as when he was here in 2019. He has a right BKA and a prosthesis from a diabetic  foot infection on the right in 2018. He has standard running shoes on the left foot to match the area in his prosthesis. He has only been covering this area with a Band-Aid has not really been specifically dressing this. ABI in our clinic on the left was 1.16. This is essentially stable from the value in 2019 10/1 the patient has a linear wound over the left first metatarsal head. Again not a lot of callus thick skin around this that I removed with a #3 curette. This is not go deep to bone or muscle it does not look to be infected. 10/8 first metatarsal head. The wound looks like it is closing however this is going to be a difficult area to offload in the future. He has a size 15 shoe and he says his shoes are years old. The inserts in these current shoes are totally useless and I have advised him to replace these. He may need a new pair of shoes and I have he got original prosthesis at [facility] I have asked him to go back there. 11/9; the patient has not been here in more than a month. Not sure he was healed when he was here the last time. I told him he would have to get new shoes and certainly offload the area over the left first metatarsal head. He has done nothing of the kind. He arrives back in clinic with the same old new balance sneakers. A very large separating callus over the first metatarsal head on the left 11/16; he has purchased new shoes and has at least 2 insoles like I asked. The wound is measuring much smaller. He is using silver alginate he changes the dressing himself 11/30; wound is measurably smaller in length of about a half a centimeter. This is generally very little depth. We have been using silver alginate. He changes the dressing himself every second day. 12/14; wound is about  the same size. Significant undermining medially relative to the size of the wound. We have been using silver alginate. There is not a way to offload this area as he walks with a prosthesis on the right leg 12/28; wound is about the same size. Again he has undermining medially. I am using polymen on this 12/02/2020; the wound looks smaller. Still a senescent edge. We have been using polymen 1/24; the wound is come down a few millimeters. Still a senescent edge. I have been using polymen 2/8; the wound is come down slightly. Still with slight undermining from 12-6 o'clock I have been using polymen partially to get offloading on this area which is opposed by his prosthesis on the other leg. 2/22 quite open improvement he still has a comma shaped wound on the left first metatarsal head. Some depth. Using polymen 3/14; he still has the same problem of a comma shaped area over the left first metatarsal head. This seems to have had more depth today at 0.6 cm. We have been using polymen. The patient changes this himself every second day. I explored the idea of a total contact cast on the left leg however this is his driving foot. He was not enamored with the idea of not being able to drive and states that he does not have anybody else to get him around 3/28; again he comes in with a smaller looking wound however there is depth and undermining requiring debridement. We have been using Hydrofera Blue, changed to silver collagen today. The patient is doing the  dressing himself 4/11; patient presents today for follow-up of his left diabetic foot wound. He denies any issues in the past 2 weeks. He is currently using silver collagen every other day with dressing changes. He does not use diabetic shoes or special inserts to his sneakers. He does not use felt pads for offloading. 4/19; patient presents for his 1 week follow-up. He denies any issues. He reports using Hydrofera Blue and has not been using silver  collagen for dressing changes. He reports minimal drainage to the wound. He overall feels well. 5/3; patient presents for 2-week follow-up. He has been using silver collagen without issues. He has no complaints today and states he overall feels well. 5/17; patient presents for 2-week follow-up. He states that the sleeve is rubbing up against the back of his right leg and causing a wound. He states this happened a week ago and has not been dressing the wound. The plantar wound is stable and he has been using collagen every other day. 5/24; Patient was had a recent hospitalization for cellulitis of his right popliteal fossa. He was admitted and started on IV antibiotics. He does not recall the plantar wound dressed while inpatient. He feels a lot better today. He was discharged on 5/21 on oral antibiotics and has not picked this up until today. He reports using collagen to the plantar wound since discharge. He is keeping the right posterior knee wound covered. He is getting a new sleeve for his prostatic on 5/26. He currently denies signs of infection. 6/1; patient presents for 1 week follow-up. He is still on doxycycline. He has not been dressing the back of the right knee wound. He states he is receiving his new prosthetic sleeve tomorrow. He states he is using silver alginate to the plantar foot wound. He currently denies signs of infection. He states he has not been offloading the left foot wound 6/7; patient presents for 1 week follow-up. He completes his last dose of doxycycline today. He finally received his prosthetic sleeve for the right side and he states it feels much better. He has been using antibiotic ointment to the back of that right knee wound. He has been using silver alginate to the plantar foot wound. He currently denies signs of infection. 6/21; patient presents for 2-week follow-up. He reports no issues with his prosthetic sleeve. He has been using antibiotic ointment to the back  of the right knee. He has been using silver alginate to the plantar foot wound. He has no issues or complaints today. He denies signs of infection. 7/5; patient presents for 2-week follow-up. He reports no issues or complaints today. He continues to use antibiotic ointment to the right posterior knee wound and silver alginate to the left plantar foot wound. He denies signs of infection. 7/19; patient presents for 2-week follow-up. He continues to use antibiotic ointment to the right posterior knee and silver alginate to the left plantar foot wound. He has no issues or complaints today. He denies signs of infection. 8/4; patient presents for 2-week follow-up. He has no issues or complaints today. He denies signs of infection. 8/18; patient presents for 2-week follow-up. He has no issues or concerns today. He has been approved for Apligraf and he would like to have this placed today. He denies signs of infection and reports that his right posterior knee wound is closed. 8/25; patient presents for 1 week follow-up. He had no issues with his first Apligraf placement last week. He did however develop an  abscess to the back of his right knee. He reports tenderness to this area. He denies systemic signs of infection. 9/1; patient presents for 1 week follow-up. He has had no issues with his plantar foot wound. The Apligraf has stayed in place over the past week. A wound culture was done at last clinic visit and a new antibiotic was sent to the pharmacy. Patient has not received this yet. He reports some mild tenderness to the back of his right knee. He denies systemic signs of infection. 9/8; patient presents for 1 week follow-up. He states he took Keflex and had diarrhea as a side effect. He was able to complete the course. He reports continued tenderness to the right posterior knee wound. He has some drainage still. He denies increased warmth or erythema to the area. He denies fever/chills or  nausea/vomiting. He has no issues or complaints today about his plantar foot wound. 9/15; patient presents for follow-up. He reports improvement in pain to the back of his right leg. He does not report any drainage. He had an ultrasound completed on 9/9 that showed a superficial fluid collection concerning for abscess. We attempted to call patient with results with no answer. We did put an urgent referral to general surgery and he reports he has not received a call. He currently denies systemic signs of infection. He reports no issues to his left plantar foot wound. 9/27; patient presents for follow-up. He has no issues or complaints today. He has had no issues with the previous wound to the back of his right leg. He has not made an appointment to see general surgery. He had Apligraf placed at last clinic visit to the plantar foot wound. He currently denies signs of infection. 10/7; patient presents for follow-up. Upon entering the room there is vomit throughout the entire floor. Patient states he does not feel well. I recommended he go to urgent care. I recommended following up next week for wound care. 10/10; patient presents for follow-up. He came last week for his follow-up however he was ill and started vomiting in the exam room. Today he states he feels better however he continues to have vomiting episodes. He states he attempted to go to urgent care/the ED but could not find a parking spot. He subsequently went home. He has no issues or complaints today regarding the foot wound. He never followed up with general surgery for the abscess identified on ultrasound to the back of his right knee. 10/20; patient presents for follow-up. He has no issues or complaints today. Overall he feels well today. 10/27; patient presents for follow-up. He has no issues or complaints today. 11/3; patient presents for follow-up. He has no issues or complaints today. He states he does not want to follow-up with  orthopedics to reevaluate the abscess in the back of his right leg. He denies signs of infection. 11/15; patient presents for follow-up. He has no issues or complaints today. He had been using silver alginate to the left first met head wound. He denies signs of infection. 11/22; patient presents for follow-up. He had Affinity placed at last clinic visit. He has no issues or complaints today. 11/29; patient presents for follow-up. He is in good spirits today. He has no issues or complaints today. 12/6; patient presents for follow-up. He has no issues or complaints today. 12/13; patient presents for follow-up. He denies signs of infection. He has no issues or complaints today. 12/20; patient presents for follow-up. He has no issues or  complaints today. He denies signs of infection. 12/29; patient presents for follow-up. He states that a few days ago he has had reaccumulation of an abscess behind the right knee. He denies increased warmth or erythema to the area. He denies fever/chills or drainage. 1/6; patient presents for follow-up. He states he went to the [facility] emergency department to be evaluated for the abscess to his right posterior knee. He was started on clindamycin. They were unable to drain the abscess. Patient states that eventually the abscess ruptured. He reports improvement in his symptoms. He has no issues or complaints about his left foot wound. Electronic Signature(s) Signed: 11/28/2021 11:44:02 AM By: Kalman Shan DO Entered By: Kalman Shan on 11/28/2021 11:39:16 -------------------------------------------------------------------------------- Physical Exam Details Patient Name: Date of Service: [name] ND, [place] 11/28/2021 9:15 A M Medical Record Number: [id number] Patient Account Number: [id number] Date of Birth/Sex: Treating RN: 09-Aug-1971 (51 y.o. [name] [name] Primary Care Provider: Ferd [name] Other Clinician: Referring Provider: Treating  Provider/Extender: [name] [name] in Treatment: 1 Constitutional respirations regular, non-labored and within target range for patient.. Cardiovascular 2+ dorsalis pedis/posterior tibialis pulses. Psychiatric pleasant and cooperative. Notes Left leg: T the first met head there is an open wound with granulation tissue, circumferential callus and nonviable tissue. No signs of infection Posterior right o knee: No area of fluctuance. No drainage noted. No open wound. Electronic Signature(s) Signed: 11/28/2021 11:44:02 AM By: Kalman Shan DO Entered By: Kalman Shan on 11/28/2021 11:40:36 -------------------------------------------------------------------------------- Physician Orders Details Patient Name: Date of Service: [name] Shire ND, [name] 11/28/2021 9:15 A M Medical Record Number: [id number] Patient Account Number: [id number] Date of Birth/Sex: Treating RN: [birth year] (51 y.o. [name] [name] Primary Care Provider: Ferd [name] Other Clinician: Referring Provider: Treating Provider/Extender: [name] [name] in Treatment: 41 Verbal / Phone Orders: No Diagnosis Coding ICD-10 Coding Code Description [phone] Non-pressure chronic ulcer of other part of left foot with fat layer exposed E11.621 Type 2 diabetes mellitus with foot ulcer E11.42 Type 2 diabetes mellitus with diabetic polyneuropathy Z89.511 Acquired absence of right leg below knee Follow-up Appointments ppointment in 1 week. - with Dr. Heber [place] Return A Cellular or Tissue Based Products Cellular or Tissue Based Product Type: - Affinity #8 Cellular or Tissue Based Product applied to wound bed, secured with steri-strips, cover with Adaptic or Mepitel. (DO NOT REMOVE). Edema Control - Lymphedema / SCD / Other Elevate legs to the level of the heart or above for 30 minutes daily and/or when sitting, a frequency of: - throughout the day Avoid standing for long periods  of time. Off-Loading Other: - minimize walking on left foot related to pressure to wound. Aid in wound healing. Wound Treatment Wound #2R - Metatarsal head first Wound Laterality: Left Cleanser: Soap and Water 1 x Per Week/15 Days Discharge Instructions: May shower and wash wound with dial antibacterial soap and water prior to dressing change. Cleanser: Wound Cleanser (Generic) 1 x Per Week/15 Days Discharge Instructions: Cleanse the wound with wound cleanser prior to applying a clean dressing using gauze sponges, not tissue or cotton balls. Peri-Wound Care: Sween Lotion (Moisturizing lotion) 1 x Per Week/15 Days Discharge Instructions: Apply moisturizing lotion to leg Prim Dressing: Affinity ary 1 x Per Week/15 Days Secondary Dressing: Woven Gauze Sponges 2x2 in (Generic) 1 x Per Week/15 Days Discharge Instructions: Apply over primary dressing as directed. Secondary Dressing: Optifoam Non-Adhesive Dressing, 4x4 in (Generic) 1 x Per Week/15 Days Discharge Instructions: cut to make foam donut to  help offload Secondary Dressing: ADAPTIC TOUCH 3x4.25 in 1 x Per Week/15 Days Discharge Instructions: Apply over primary dressing as directed. Secured With: [employer], 4.5x3.1 (in/yd) 1 x Per Week/15 Days Discharge Instructions: Secure with Kerlix as directed. Secured With: 78M Medipore H Soft Cloth Surgical Tape, 2x2 (in/yd) (Generic) 1 x Per Week/15 Days Discharge Instructions: Secure dressing with tape as directed. Electronic Signature(s) Signed: 11/28/2021 11:44:02 AM By: Kalman Shan DO Previous Signature: 11/28/2021 11:36:31 AM Version By: Baruch Gouty RN, BSN Entered By: Kalman Shan on 11/28/2021 11:40:55 -------------------------------------------------------------------------------- Problem List Details Patient Name: Date of Service: [name] Shire ND, [place] 11/28/2021 9:15 A M Medical Record Number: [id number] Patient Account Number: [id number] Date of  Birth/Sex: Treating RN: 1971-09-07 (51 y.o. [name] [name] Primary Care Provider: Ferd [name] Other Clinician: Referring Provider: Treating Provider/Extender: [name] [name] in Treatment: 82 Active Problems ICD-10 Encounter Code Description Active Date MDM Diagnosis L97.522 Non-pressure chronic ulcer of other part of left foot with fat layer exposed 08/16/2020 No Yes E11.621 Type 2 diabetes mellitus with foot ulcer 08/16/2020 No Yes E11.42 Type 2 diabetes mellitus with diabetic polyneuropathy 08/16/2020 No Yes Z89.511 Acquired absence of right leg below knee 08/16/2020 No Yes Inactive Problems Resolved Problems ICD-10 Code Description Active Date Resolved Date S81.801D Unspecified open wound, right lower leg, subsequent encounter 04/08/2021 04/08/2021 Electronic Signature(s) Signed: 11/28/2021 11:44:02 AM By: Kalman Shan DO Previous Signature: 11/28/2021 11:36:31 AM Version By: Baruch Gouty RN, BSN Entered By: Kalman Shan on 11/28/2021 11:36:35 -------------------------------------------------------------------------------- Progress Note Details Patient Name: Date of Service: [name] ND, [name] 11/28/2021 9:15 A M Medical Record Number: [id number] Patient Account Number: [id number] Date of Birth/Sex: Treating RN: 1971-11-21 (51 y.o. [name] [name] Primary Care Provider: Ferd [name] Other Clinician: Referring Provider: Treating Provider/Extender: [name] [name] in Treatment: 1 Subjective Chief Complaint Information obtained from Patient patient is here for a review of the wound on his plantar left first metatarsal head and back of his right leg History of Present Illness (HPI) 04/21/18 ADMISSION This is a 51 year old man who is a type II diabetic. In spite of fact his hemoglobin A1c is actually quite good 5.73 months ago he is at a really difficult time over the last 6-7 months. He developed a rapidly  progressive infection in the right foot in November 2018 associated with osteomyelitis and necrotizing fasciitis. 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 The stump required and a revision on 11/24/17. The stump revision was advertised as being secondary to falls. I'm not sure the progressive history here however this area is actually closed over. The patient tells me over the same timeframe he has had wounds on the plantar aspect of his left foot. In the ED saw Dr. Sharol Given in April of this year. 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 It is really not clear that this patient is been dressing this with anything. He came in with the clinic without any specific dressing on the wound areas using his own tennis shoe. He tells me that he only ambulates of course to do a pivot transfer. He does not have his prosthesis for the right leg as of yet and he blames Medicaid for this. He does however use the foot to push himself along in his wheelchair at home. The patient has not had formal arterial studies. ABI in our clinic on the left was 1.13. Patient's past medical history includes type  2 diabetes, fracture of the right fibula. I see that he was treated for abscesses on his right buttock and chest in 2016. I did not look at the microbiology of this. 04/28/18; patient comes back in the clinic today with the wound pretty much the same as when he came in here last week. Small opening lots of undermining relatively. He tells [place] that nothing is really been on this for 3 days in spite of the fact that we gave him enough to dress this easily especially such a small wound. He says he lives with his mother, she is not capable of assisting with this he is changing the dressings himself. 05/05/18; much better-looking wound today. Smaller. There is some undermining medially however that's only perhaps 2 mm. No surrounding erythema. We've been using silver collagen 05/12/18; small  wound on the first metatarsal head. No undermining no surrounding erythema. We've been using silver collagen he has home health coming out to change 05/20/18 the wound on the first metatarsal head looks better. Covered in surface debris/callus nonviable tissue. Required debridement but post debridement this looks quite good. We've been using silver collagen. He has home health 05/27/18; first metatarsal head wound continues to improve. Just about completely closed. Still a lot of surface debridement callus. We've been using silver collagen 06/06/18; the first metatarsal wound is completely healed over. There is still a lot of callus. I gently removed some of this just to make sure that there was no open area and there is not. The patient mentions to me that his left leg has felt like "lead" for about a week READMISSION 08/16/2020 This is a 51 year old man with type 2 diabetes. He returns to clinic today with a 1 month history of a blister and callus over the first metatarsal head. This is in exactly the same area as when he was here in 2019. 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 He has standard running shoes on the left foot to match the area in his prosthesis. He has only been covering this area with a Band-Aid has not really been specifically dressing this. ABI in our clinic on the left was 1.16. This is essentially stable from the value in 2019 10/1 the patient has a linear wound over the left first metatarsal head. Again not a lot of callus thick skin around this that I removed with a #3 curette. This is not go deep to bone or muscle it does not look to be infected. 10/8 first metatarsal head. The wound looks like it is closing however this is going to be a difficult area to offload in the future. He has a size 15 shoe and he says his shoes are years old. The inserts in these current shoes are totally useless and I have advised him to replace these.  He may need a new pair of shoes and I have he got original prosthesis at [facility] [place] I have asked him to go back there. 11/9; the patient has not been here in more than a month. Not sure he was healed when he was here the last time. I told him he would have to get new shoes and certainly offload the area over the left first metatarsal head. He has done nothing of the kind. He arrives back in clinic with the same old new balance sneakers. A very large separating callus over the first metatarsal head on the left 11/16;  he has purchased new shoes and has at least 2 insoles like I asked. The wound is measuring much smaller. He is using silver alginate he changes the dressing himself 11/30; wound is measurably smaller in length of about a half a centimeter. This is generally very little depth. We have been using silver alginate. He changes the dressing himself every second day. 12/14; wound is about the same size. Significant undermining medially relative to the size of the wound. We have been using silver alginate. There is not a way to offload this area as he walks with a prosthesis on the right leg 12/28; wound is about the same size. Again he has undermining medially. I am using polymen on this 12/02/2020; the wound looks smaller. Still a senescent edge. We have been using polymen 1/24; the wound is come down a few millimeters. Still a senescent edge. I have been using polymen 2/8; the wound is come down slightly. Still with slight undermining from 12-6 o'clock I have been using polymen partially to get offloading on this area which is opposed by his prosthesis on the other leg. 2/22 quite open improvement he still has a comma shaped wound on the left first metatarsal head. Some depth. Using polymen 3/14; he still has the same problem of a comma shaped area over the left first metatarsal head. This seems to have had more depth today at 0.6 cm. We have been using polymen. The patient changes  this himself every second day. I explored the idea of a total contact cast on the left leg however this is his driving foot. He was not enamored with the idea of not being able to drive and states that he does not have anybody else to get him around 3/28; again he comes in with a smaller looking wound however there is depth and undermining requiring debridement. We have been using Hydrofera Blue, changed to silver collagen today. The patient is doing the dressing himself 4/11; patient presents today for follow-up of his left diabetic foot wound. He denies any issues in the past 2 weeks. He is currently using silver collagen every other day with dressing changes. He does not use diabetic shoes or special inserts to his sneakers. He does not use felt pads for offloading. 4/19; patient presents for his 1 week follow-up. He denies any issues. He reports using Hydrofera Blue and has not been using silver collagen for dressing changes. He reports minimal drainage to the wound. He overall feels well. 5/3; patient presents for 2-week follow-up. He has been using silver collagen without issues. He has no complaints today and states he overall feels well. 5/17; patient presents for 2-week follow-up. He states that the sleeve is rubbing up against the back of his right leg and causing a wound. He states this happened a week ago and has not been dressing the wound. The plantar wound is stable and he has been using collagen every other day. 5/24; Patient was had a recent hospitalization for cellulitis of his right popliteal fossa. He was admitted and started on IV antibiotics. He does not recall the plantar wound dressed while inpatient. He feels a lot better today. He was discharged on 5/21 on oral antibiotics and has not picked this up until today. He reports using collagen to the plantar wound since discharge. He is keeping the right posterior knee wound covered. He is getting a new sleeve for his prostatic on  5/26. He currently denies signs of infection. 6/1; patient presents for  1 week follow-up. He is still on doxycycline. He has not been dressing the back of the right knee wound. He states he is receiving his new prosthetic sleeve tomorrow. He states he is using silver alginate to the plantar foot wound. He currently denies signs of infection. He states he has not been offloading the left foot wound 6/7; patient presents for 1 week follow-up. He completes his last dose of doxycycline today. He finally received his prosthetic sleeve for the right side and he states it feels much better. He has been using antibiotic ointment to the back of that right knee wound. He has been using silver alginate to the plantar foot wound. He currently denies signs of infection. 6/21; patient presents for 2-week follow-up. He reports no issues with his prosthetic sleeve. He has been using antibiotic ointment to the back of the right knee. He has been using silver alginate to the plantar foot wound. He has no issues or complaints today. He denies signs of infection. 7/5; patient presents for 2-week follow-up. He reports no issues or complaints today. He continues to use antibiotic ointment to the right posterior knee wound and silver alginate to the left plantar foot wound. He denies signs of infection. 7/19; patient presents for 2-week follow-up. He continues to use antibiotic ointment to the right posterior knee and silver alginate to the left plantar foot wound. He has no issues or complaints today. He denies signs of infection. 8/4; patient presents for 2-week follow-up. He has no issues or complaints today. He denies signs of infection. 8/18; patient presents for 2-week follow-up. He has no issues or concerns today. He has been approved for Apligraf and he would like to have this placed today. He denies signs of infection and reports that his right posterior knee wound is closed. 8/25; patient presents for 1 week  follow-up. He had no issues with his first Apligraf placement last week. He did however develop an abscess to the back of his right knee. He reports tenderness to this area. He denies systemic signs of infection. 9/1; patient presents for 1 week follow-up. He has had no issues with his plantar foot wound. The Apligraf has stayed in place over the past week. A wound culture was done at last clinic visit and a new antibiotic was sent to the pharmacy. Patient has not received this yet. He reports some mild tenderness to the back of his right knee. He denies systemic signs of infection. 9/8; patient presents for 1 week follow-up. He states he took Keflex and had diarrhea as a side effect. He was able to complete the course. He reports continued tenderness to the right posterior knee wound. He has some drainage still. He denies increased warmth or erythema to the area. He denies fever/chills or nausea/vomiting. He has no issues or complaints today about his plantar foot wound. 9/15; patient presents for follow-up. He reports improvement in pain to the back of his right leg. He does not report any drainage. He had an ultrasound completed on 9/9 that showed a superficial fluid collection concerning for abscess. We attempted to call patient with results with no answer. We did put an urgent referral to general surgery and he reports he has not received a call. He currently denies systemic signs of infection. He reports no issues to his left plantar foot wound. 9/27; patient presents for follow-up. He has no issues or complaints today. He has had no issues with the previous wound to the back of his  right leg. He has not made an appointment to see general surgery. He had Apligraf placed at last clinic visit to the plantar foot wound. He currently denies signs of infection. 10/7; patient presents for follow-up. Upon entering the room there is vomit throughout the entire floor. Patient states he does not feel well.  I recommended he go to urgent care. I recommended following up next week for wound care. 10/10; patient presents for follow-up. He came last week for his follow-up however he was ill and started vomiting in the exam room. Today he states he feels better however he continues to have vomiting episodes. He states he attempted to go to urgent care/the ED but could not find a parking spot. He subsequently went home. He has no issues or complaints today regarding the foot wound. He never followed up with general surgery for the abscess identified on ultrasound to the back of his right knee. 10/20; patient presents for follow-up. He has no issues or complaints today. Overall he feels well today. 10/27; patient presents for follow-up. He has no issues or complaints today. 11/3; patient presents for follow-up. He has no issues or complaints today. He states he does not want to follow-up with orthopedics to reevaluate the abscess in the back of his right leg. He denies signs of infection. 11/15; patient presents for follow-up. He has no issues or complaints today. He had been using silver alginate to the left first met head wound. He denies signs of infection. 11/22; patient presents for follow-up. He had Affinity placed at last clinic visit. He has no issues or complaints today. 11/29; patient presents for follow-up. He is in good spirits today. He has no issues or complaints today. 12/6; patient presents for follow-up. He has no issues or complaints today. 12/13; patient presents for follow-up. He denies signs of infection. He has no issues or complaints today. 12/20; patient presents for follow-up. He has no issues or complaints today. He denies signs of infection. 12/29; patient presents for follow-up. He states that a few days ago he has had reaccumulation of an abscess behind the right knee. He denies increased warmth or erythema to the area. He denies fever/chills or drainage. 1/6; patient presents  for follow-up. He states he went to the [facility] emergency department to be evaluated for the abscess to his right posterior knee. He was started on clindamycin. They were unable to drain the abscess. Patient states that eventually the abscess ruptured. He reports improvement in his symptoms. He has no issues or complaints about his left foot wound. Patient History Information obtained from Patient. Family History Diabetes - Maternal Grandparents,Mother, Heart Disease - Father, Hypertension - Father, No family history of Cancer, Hereditary Spherocytosis, Kidney Disease, Lung Disease, Seizures, Stroke, Thyroid Problems, Tuberculosis. Social History Current every day smoker, Marital Status - Single, Alcohol Use - Never, Drug Use - No History, Caffeine Use - Never. Medical History Eyes Denies history of Cataracts, Glaucoma, Optic Neuritis Ear/Nose/Mouth/Throat Denies history of Chronic sinus problems/congestion, Middle ear problems Hematologic/Lymphatic Patient has history of Anemia Denies history of Hemophilia, Human Immunodeficiency Virus, Lymphedema, Sickle Cell Disease Respiratory Denies history of Aspiration, Asthma, Chronic Obstructive Pulmonary Disease (COPD), Pneumothorax, Sleep Apnea, Tuberculosis Cardiovascular Patient has history of Hypertension, Peripheral Venous Disease Denies history of Angina, Arrhythmia, Congestive Heart Failure, Coronary Artery Disease, Deep Vein Thrombosis, Hypotension, Myocardial Infarction, Peripheral Arterial Disease, Phlebitis, Vasculitis Gastrointestinal Denies history of Cirrhosis , Colitis, Crohnoos, Hepatitis A, Hepatitis B, Hepatitis C Endocrine Patient has history of Type  II Diabetes - oral meds Denies history of Type I Diabetes Genitourinary Denies history of End Stage Renal Disease Immunological Denies history of Lupus Erythematosus, Raynaudoos, Scleroderma Integumentary (Skin) Denies history of History of  Burn Musculoskeletal Denies history of Gout, Rheumatoid Arthritis, Osteoarthritis, Osteomyelitis Neurologic Denies history of Dementia, Neuropathy, Quadriplegia, Paraplegia, Seizure Disorder Oncologic Denies history of Received Chemotherapy, Received Radiation Psychiatric Denies history of Anorexia/bulimia, Confinement Anxiety Hospitalization/Surgery History - fever 105, sepsis cellulitis right popliteal fossa 04/09/2021. Medical A Surgical History Notes nd Constitutional Symptoms (General Health) falls , leukocytosis Respiratory During waking hours and catches himself not breathing, "gasp for air". Saw Dr Wynonia Lawman, he couldn't find a reason Objective Constitutional respirations regular, non-labored and within target range for patient.. Vitals Time Taken: 9:11 AM, Height: 77 in, Source: Stated, Weight: 232 lbs, Source: Stated, BMI: 27.5, Temperature: 97.7 F, Pulse: 87 bpm, Respiratory Rate: 18 breaths/min, Blood Pressure: 144/83 mmHg. Cardiovascular 2+ dorsalis pedis/posterior tibialis pulses. Psychiatric pleasant and cooperative. General Notes: Left leg: T the first met head there is an open wound with granulation tissue, circumferential callus and nonviable tissue. No signs of infection o Posterior right knee: No area of fluctuance. No drainage noted. No open wound. Integumentary (Hair, Skin) Wound #2R status is Open. Original cause of wound was Blister. The date acquired was: 06/23/2020. The wound has been in treatment 67 weeks. The wound is located on the Left Metatarsal head first. The wound measures 0.7cm length x 0.1cm width x 0.6cm depth; 0.055cm^2 area and 0.033cm^3 volume. There is Fat Layer (Subcutaneous Tissue) exposed. There is no tunneling noted, however, there is undermining starting at 12:00 and ending at 12:00 with a maximum distance of 0.3cm. There is a medium amount of serosanguineous drainage noted. The wound margin is thickened. There is large (67-100%) red  granulation within the wound bed. There is no necrotic tissue within the wound bed. 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 I debrided nonviable tissue. No signs of infection. Affinity #8 was placed in standard fashion. Patient's right posterior abscess has resolved. No concerning features on exam. Procedures Wound #2R Pre-procedure diagnosis of Wound #2R is a Diabetic Wound/Ulcer of the Lower Extremity located on the Left Metatarsal head first .Severity of Tissue Pre Debridement is: Fat layer exposed. There was a Excisional Skin/Subcutaneous Tissue Debridement with a total area of 0.14 sq cm performed by Kalman Shan, DO. With the following instrument(s): Curette to remove Viable and Non-Viable tissue/material. Material removed includes Callus, Subcutaneous Tissue, and Skin: Epidermis after achieving pain control using Other (benzocaine 20% spray). No specimens were taken. A time out was conducted at 09:50, prior to the start of the procedure. A Minimum amount of bleeding was controlled with Pressure. The procedure was tolerated well with a pain level of 0 throughout and a pain level of 0 following the procedure. Post Debridement Measurements: 0.7cm length x 0.2cm width x 0.6cm depth; 0.066cm^3 volume. Character of Wound/Ulcer Post Debridement is improved. Severity of Tissue Post Debridement is: Fat layer exposed. Post procedure Diagnosis Wound #2R: Same as Pre-Procedure Pre-procedure diagnosis of Wound #2R is a Diabetic Wound/Ulcer of the Lower Extremity located on the Left Metatarsal head first. A skin graft procedure using a bioengineered skin substitute/cellular or tissue based product was performed by Kalman Shan, DO with the following  instrument(s): Forceps. Affinity was applied and secured with Steri-Strips. 3 sq cm of product was utilized and 0 sq cm was wasted. Post Application, adaptic, gauze, foam was applied. A Time Out was conducted at 09:55, prior to the start of the procedure. The procedure was tolerated well with a pain level of 0 throughout and a pain level of 0 following the procedure. Post procedure Diagnosis Wound #2R: Same as Pre-Procedure . Plan Follow-up Appointments: Return Appointment in 1 week. - with Dr. Heber [place] Cellular or Tissue Based Products: Cellular or Tissue Based Product Type: - Affinity #8 Cellular or Tissue Based Product applied to wound bed, secured with steri-strips, cover with Adaptic or Mepitel. (DO NOT REMOVE). Edema Control - Lymphedema / SCD / Other: Elevate legs to the level of the heart or above for 30 minutes daily and/or when sitting, a frequency of: - throughout the day Avoid standing for long periods of time. Off-Loading: Other: - minimize walking on left foot related to pressure to wound. Aid in wound healing. WOUND #2R: - Metatarsal head first Wound Laterality: Left Cleanser: Soap and Water 1 x Per Week/15 Days Discharge Instructions: May shower and wash wound with dial antibacterial soap and water prior to dressing change. Cleanser: Wound Cleanser (Generic) 1 x Per Week/15 Days Discharge Instructions: Cleanse the wound with wound cleanser prior to applying a clean dressing using gauze sponges, not tissue or cotton balls. Peri-Wound Care: Sween Lotion (Moisturizing lotion) 1 x Per Week/15 Days Discharge Instructions: Apply moisturizing lotion to leg Prim Dressing: Affinity 1 x Per Week/15 Days ary Secondary Dressing: Woven Gauze Sponges 2x2 in (Generic) 1 x Per Week/15 Days Discharge Instructions: Apply over primary dressing as directed. Secondary Dressing: Optifoam Non-Adhesive Dressing, 4x4 in (Generic) 1 x Per Week/15 Days Discharge Instructions: cut to make foam donut  to help offload Secondary Dressing: ADAPTIC TOUCH 3x4.25 in 1 x Per Week/15 Days Discharge Instructions: Apply over primary dressing as directed. Secured With: [employer], 4.5x3.1 (in/yd) 1 x Per Week/15 Days Discharge Instructions: Secure with Kerlix as directed. Secured With: 45M Medipore H Soft Cloth Surgical T ape, 2x2 (in/yd) (Generic) 1 x Per Week/15 Days Discharge Instructions: Secure dressing with tape as directed. 1. In office sharp debridement 2. Affinity #8 placed in standard fashion 3. Aggressive offloading 4. Follow-up in 1 week Electronic Signature(s) Signed: 11/28/2021 11:44:02 AM By: Kalman Shan DO Entered By: Kalman Shan on 11/28/2021 11:42:06 -------------------------------------------------------------------------------- HxROS Details Patient Name: Date of Service: [name] ND, [place] 11/28/2021 9:15 A M Medical Record Number: [id number] Patient Account Number: [id number] Date of Birth/Sex: Treating RN: 03-Apr-1971 (51 y.o. [name] [name] Primary Care Provider: Ferd [name] Other Clinician: Referring Provider: Treating Provider/Extender: [name] [name] in Treatment: 49 Information Obtained From Patient Constitutional Symptoms (General Health) Medical History: Past Medical History Notes: falls , leukocytosis Eyes Medical History: Negative for: Cataracts; Glaucoma; Optic Neuritis Ear/Nose/Mouth/Throat Medical History: Negative for: Chronic sinus problems/congestion; Middle ear problems Hematologic/Lymphatic Medical History: Positive for: Anemia Negative for: Hemophilia; Human Immunodeficiency Virus; Lymphedema; Sickle Cell Disease Respiratory Medical History: Negative for: Aspiration; Asthma; Chronic Obstructive Pulmonary Disease (COPD); Pneumothorax; Sleep Apnea; Tuberculosis Past Medical History Notes: During waking hours and catches himself not breathing, "gasp for air". Saw Dr Wynonia Lawman, he couldn't find a  reason Cardiovascular Medical History: Positive for: Hypertension; Peripheral Venous Disease Negative for: Angina; Arrhythmia; Congestive Heart Failure; Coronary Artery Disease; Deep Vein Thrombosis; Hypotension; Myocardial Infarction; Peripheral Arterial Disease; Phlebitis; Vasculitis Gastrointestinal Medical History: Negative for: Cirrhosis ; Colitis;  Crohns; Hepatitis A; Hepatitis B; Hepatitis C Endocrine Medical History: Positive for: Type II Diabetes - oral meds Negative for: Type I Diabetes Time with diabetes: 2014 Treated with: Oral agents Blood sugar tested every day: Yes Tested : Blood sugar testing results: Breakfast: 91 Genitourinary Medical History: Negative for: End Stage Renal Disease Immunological Medical History: Negative for: Lupus Erythematosus; Raynauds; Scleroderma Integumentary (Skin) Medical History: Negative for: History of Burn Musculoskeletal Medical History: Negative for: Gout; Rheumatoid Arthritis; Osteoarthritis; Osteomyelitis Neurologic Medical History: Negative for: Dementia; Neuropathy; Quadriplegia; Paraplegia; Seizure Disorder Oncologic Medical History: Negative for: Received Chemotherapy; Received Radiation Psychiatric Medical History: Negative for: Anorexia/bulimia; Confinement Anxiety Immunizations Pneumococcal Vaccine: Received Pneumococcal Vaccination: No Implantable Devices No devices added Hospitalization / Surgery History Type of Hospitalization/Surgery fever 105, sepsis cellulitis right popliteal fossa 04/09/2021 Family and Social History Cancer: No; Diabetes: Yes - Maternal Grandparents,Mother; Heart Disease: Yes - Father; Hereditary Spherocytosis: No; Hypertension: Yes - Father; Kidney Disease: No; Lung Disease: No; Seizures: No; Stroke: No; Thyroid Problems: No; Tuberculosis: No; Current every day smoker; Marital Status - Single; Alcohol Use: Never; Drug Use: No History; Caffeine Use: Never; Financial Concerns: No; Food,  Clothing or Shelter Needs: No; Support System Lacking: No; Transportation Concerns: No Electronic Signature(s) Signed: 11/28/2021 11:44:02 AM By: Kalman Shan DO Signed: 12/02/2021 5:43:46 PM By: Baruch Gouty RN, BSN Entered By: Kalman Shan on 11/28/2021 11:39:26 -------------------------------------------------------------------------------- [place] Details Patient Name: Date of Service: [name] ND, [name] 11/28/2021 Medical Record Number: [id number] Patient Account Number: [id number] Date of Birth/Sex: Treating RN: 1971/10/08 (51 y.o. 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 Facility Procedures CPT4 Code: [id number] Description: A2130 AFFINITY, 1.5X 1.5 SQ CM ICD-10 Diagnosis Description L97.522 Non-pressure chronic ulcer of other part of left foot with fat layer exposed E11.621 Type 2 diabetes mellitus with foot ulcer Modifier: Quantity: 3 CPT4 Code: [id number] Description: 62952 - SKIN SUB GRAFT FACE/NK/HF/G ICD-10 Diagnosis Description L97.522 Non-pressure chronic ulcer of other part of left foot with fat layer exposed E11.621 Type 2 diabetes mellitus with foot ulcer Modifier: Quantity: 1 Physician Procedures : CPT4 Code Description Modifier [id number] - WC PHYS SKIN SUB GRAFT FACE/NK/HF/G ICD-10 Diagnosis Description L97.522 Non-pressure chronic ulcer of other part of left foot with fat layer exposed E11.621 Type 2 diabetes mellitus with foot ulcer Quantity: 1 Electronic Signature(s) Signed: 11/28/2021 11:44:02 AM By: Kalman Shan  DO Previous Signature: 11/28/2021 11:36:31 AM Version By: Baruch Gouty RN, BSN Entered By: Kalman Shan on 11/28/2021 11:43:05

## 2021-12-02 NOTE — Progress Notes (Signed)
[name] [name], [name] [name] ([id number]) Visit Report for 11/28/2021 Arrival Information Details Patient Name: Date of Service: [name] [name] 11/28/2021 9:15 A M Medical Record Number: [id number] Patient Account Number: [id number] Date of Birth/Sex: Treating RN: [birth year] (51 y.o. [name] [name], [name] [name] Primary Care Jahnya Trindade: [name] Other Clinician: Referring Della Scrivener: Treating Elizardo Chilson/Extender: Delano Metz in Treatment: 33 Visit Information History Since Last Visit Added or deleted any medications: Yes Patient Arrived: Ambulatory Any new allergies or adverse reactions: No Arrival Time: 09:08 Had a fall or experienced change in No Accompanied By: self activities of daily living that may affect Transfer Assistance: None risk of falls: Patient Identification Verified: Yes Signs or symptoms of abuse/neglect since last visito No Secondary Verification Process Completed: Yes Hospitalized since last visit: No Patient Requires Transmission-Based Precautions: No Implantable device outside of the clinic excluding No Patient Has Alerts: No cellular tissue based products placed in the center since last visit: Pain Present Now: Yes Electronic Signature(s) Signed: 11/28/2021 11:36:31 AM By: Baruch Gouty RN, BSN Entered By: Baruch Gouty on 11/28/2021 09:09:03 -------------------------------------------------------------------------------- Encounter Discharge Information Details Patient Name: Date of Service: [name] [name], [name] 11/28/2021 9:15 A M Medical Record Number: [id number] Patient Account Number: [id number] Date of Birth/Sex: Treating RN: 1971/02/27 (51 y.o. [name] [name] Primary Care Jontay Maston: [name] Other Clinician: Referring Xaviera Flaten: Treating Alani Lacivita/Extender: Delano Metz in Treatment: 76 Encounter Discharge Information Items Post Procedure Vitals Discharge Condition: Stable Temperature (F):  97.7 Ambulatory Status: Ambulatory Pulse (bpm): 87 Discharge Destination: Home Respiratory Rate (breaths/min): 18 Transportation: Private Auto Blood Pressure (mmHg): 144/83 Accompanied By: self Schedule Follow-up Appointment: Yes Clinical Summary of Care: Patient Declined Electronic Signature(s) Signed: 11/28/2021 11:36:31 AM By: Baruch Gouty RN, BSN Entered By: Baruch Gouty on 11/28/2021 10:15:02 -------------------------------------------------------------------------------- Lower Extremity Assessment Details Patient Name: Date of Service: [name] [name], [name] [name] 11/28/2021 9:15 A M Medical Record Number: [id number] Patient Account Number: [id number] Date of Birth/Sex: Treating RN: [birth year] (51 y.o. [name] [name] Primary Care Nayana Lenig: [name] Other Clinician: Referring Alarik Radu: Treating [name] [name]/Extender: Delano Metz in Treatment: 22 Edema Assessment Assessed: [name]: No] [Right: No] Edema: [Left: Ye] [Right: s] Calf Left: Right: Point of Measurement: 50 cm From Medial Instep 39 cm Ankle Left: Right: Point of Measurement: 11 cm From Medial Instep 26 cm Vascular Assessment Pulses: Dorsalis Pedis Palpable: [Left:Yes] Electronic Signature(s) Signed: 11/28/2021 11:36:31 AM By: Baruch Gouty RN, BSN Entered By: Baruch Gouty on 11/28/2021 09:12:56 -------------------------------------------------------------------------------- Multi Wound Chart Details Patient Name: Date of Service: [name] [name], [name] 11/28/2021 9:15 A M Medical Record Number: [id number] Patient Account Number: [id number] Date of Birth/Sex: Treating RN: [birth year] (51 y.o. [name] [name] Primary Care Chinara Hertzberg: [name] Other Clinician: Referring [name] [name]: Treating Saydee Zolman/Extender: Delano Metz in Treatment: 46 Vital Signs Height(in): 77 Pulse(bpm): 87 Weight(lbs): 232 Blood Pressure(mmHg): 144/83 Body Mass  Index(BMI): 28 Temperature(F): 97.7 Respiratory Rate(breaths/min): 18 Photos: [2R:Left Metatarsal head first] [N/A:N/A N/A] Wound Location: [2R:Blister] [N/A:N/A] Wounding Event: [2R:Diabetic Wound/Ulcer of the Lower] [N/A:N/A] Primary Etiology: [2R:Extremity Anemia, Hypertension, Peripheral] [N/A:N/A] Comorbid History: [2R:Venous Disease, Type II Diabetes 06/23/2020] [N/A:N/A] Date Acquired: [2R:67] [N/A:N/A] Weeks of Treatment: [2R:Open] [N/A:N/A] Wound Status: [2R:Yes] [N/A:N/A] Wound Recurrence: [2R:0.7x0.1x0.6] [N/A:N/A] Measurements L x W x D (cm) [2R:0.055] [N/A:N/A] A (cm) : rea [2R:0.033] [N/A:N/A] Volume (cm) : [2R:-77.40%] [N/A:N/A] % Reduction in A [2R:rea: -1000.00%] [N/A:N/A] % Reduction in Volume: [2R:12] Starting Position 1 (o'clock): [2R:12] Ending Position 1 (o'clock): [2R:0.3] Maximum  Distance 1 (cm): [2R:Yes] [N/A:N/A] Undermining: [2R:Grade 2] [N/A:N/A] Classification: [2R:Medium] [N/A:N/A] Exudate A mount: [2R:Serosanguineous] [N/A:N/A] Exudate Type: [2R:red, brown] [N/A:N/A] Exudate Color: [2R:Thickened] [N/A:N/A] Wound Margin: [2R:Large (67-100%)] [N/A:N/A] Granulation A mount: [2R:Red] [N/A:N/A] Granulation Quality: [2R:None Present (0%)] [N/A:N/A] Necrotic A mount: [2R:Fat Layer (Subcutaneous Tissue): Yes N/A] Exposed Structures: [2R:Fascia: No Tendon: No Muscle: No Joint: No Bone: No Medium (34-66%)] [N/A:N/A] Epithelialization: [2R:Debridement - Excisional] [N/A:N/A] Debridement: Pre-procedure Verification/Time Out 09:50 [N/A:N/A] Taken: [2R:Other] [N/A:N/A] Pain Control: [2R:Callus, Subcutaneous] [N/A:N/A] Tissue Debrided: [2R:Skin/Subcutaneous Tissue] [N/A:N/A] Level: [2R:0.14] [N/A:N/A] Debridement A (sq cm): [2R:rea Curette] [N/A:N/A] Instrument: [2R:Minimum] [N/A:N/A] Bleeding: [2R:Pressure] [N/A:N/A] Hemostasis A chieved: [2R:0] [N/A:N/A] Procedural Pain: [2R:0] [N/A:N/A] Post Procedural Pain: [2R:Procedure was tolerated well]  [N/A:N/A] Debridement Treatment Response: [2R:0.7x0.2x0.6] [N/A:N/A] Post Debridement Measurements L x W x D (cm) [2R:0.066] [N/A:N/A] Post Debridement Volume: (cm) [2R:Cellular or Tissue Based Product] [N/A:N/A] Procedures Performed: [2R:Debridement] Treatment Notes Wound #2R (Metatarsal head first) Wound Laterality: Left Cleanser Soap and Water Discharge Instruction: May shower and wash wound with dial antibacterial soap and water prior to dressing change. Wound Cleanser Discharge Instruction: Cleanse the wound with wound cleanser prior to applying a clean dressing using gauze sponges, not tissue or cotton balls. Peri-Wound Care Sween Lotion (Moisturizing lotion) Discharge Instruction: Apply moisturizing lotion to leg Topical Primary Dressing Affinity Secondary Dressing Woven Gauze Sponges 2x2 in Discharge Instruction: Apply over primary dressing as directed. Optifoam Non-Adhesive Dressing, 4x4 in Discharge Instruction: cut to make foam donut to help offload ADAPTIC TOUCH 3x4.25 in Discharge Instruction: Apply over primary dressing as directed. Secured With [employer], 4.5x3.1 (in/yd) Discharge Instruction: Secure with Kerlix as directed. 41M Medipore H Soft Cloth Surgical T ape, 2x2 (in/yd) Discharge Instruction: Secure dressing with tape as directed. Compression Wrap Compression Stockings Add-Ons Electronic Signature(s) Signed: 11/28/2021 11:44:02 AM By: Kalman Shan DO Signed: 12/02/2021 5:43:46 PM By: Baruch Gouty RN, BSN Entered By: Kalman Shan on 11/28/2021 11:36:45 -------------------------------------------------------------------------------- Multi-Disciplinary Care Plan Details Patient Name: Date of Service: [name] [name], [place] 11/28/2021 9:15 A M Medical Record Number: [id number] Patient Account Number: [id number] Date of Birth/Sex: Treating RN: 02-Oct-1971 (51 y.o. [name] [name] Primary Care Cheyan Frees: [name] Other  Clinician: Referring Mkayla Steele: Treating Tyree Fluharty/Extender: Delano Metz in Treatment: 1 Multidisciplinary Care Plan reviewed with physician Active Inactive Abuse / Safety / Falls / Self Care Management Nursing Diagnoses: History of Falls Goals: Patient/caregiver will verbalize/demonstrate measures taken to prevent injury and/or falls Date Initiated: 11/04/2021 Target Resolution Date: 12/02/2021 Goal Status: Active Interventions: Assess fall risk on admission and as needed Assess impairment of mobility on admission and as needed per policy Notes: Wound/Skin Impairment Nursing Diagnoses: Impaired tissue integrity Goals: Patient/caregiver will verbalize understanding of skin care regimen Date Initiated: 11/19/2020 Target Resolution Date: 12/05/2021 Goal Status: Active Ulcer/skin breakdown will have a volume reduction of 30% by week 4 Date Initiated: 08/16/2020 Date Inactivated: 03/03/2021 Target Resolution Date: 03/10/2021 Goal Status: Met Interventions: Provide education on ulcer and skin care Notes: 07/24/21: Wound care regimen ongoing. Electronic Signature(s) Signed: 11/28/2021 11:36:31 AM By: Baruch Gouty RN, BSN Entered By: Baruch Gouty on 11/28/2021 09:18:27 -------------------------------------------------------------------------------- Pain Assessment Details Patient Name: Date of Service: [name] [name], [place] 11/28/2021 9:15 A M Medical Record Number: [id number] Patient Account Number: [id number] Date of Birth/Sex: Treating RN: 1971-03-26 (51 y.o. [name] [name] Primary Care Joneric Streight: [name] Other Clinician: Referring Elsey Holts: Treating Claudia Greenley/Extender: Delano Metz in Treatment: 72 Active Problems Location of Pain Severity and Description of  Pain Patient Has Paino Yes Site Locations Pain Location: Pain in Ulcers With Dressing Change: Yes Duration of the Pain. Constant / Intermittento  Constant Rate the pain. Current Pain Level: 6 Character of Pain Describe the Pain: Aching Pain Management and Medication Current Pain Management: Medication: Yes Is the Current Pain Management Adequate: Adequate How does your wound impact your activities of daily livingo Sleep: No Bathing: No Appetite: No Relationship With Others: No Bladder Continence: No Emotions: No Bowel Continence: No Work: No Toileting: No Drive: No Dressing: No Hobbies: No Electronic Signature(s) Signed: 11/28/2021 11:36:31 AM By: Baruch Gouty RN, BSN Entered By: Baruch Gouty on 11/28/2021 09:12:42 -------------------------------------------------------------------------------- Patient/Caregiver Education Details Patient Name: Date of Service: [name] [name] 1/6/2023andnbsp9:15 [place] Record Number: [id number] Patient Account Number: [id number] Date of Birth/Gender: Treating RN: Sep 22, 1971 (51 y.o. [name] [name] Primary Care Physician: [name] Other Clinician: Referring Physician: Treating Physician/Extender: Delano Metz in Treatment: 29 Education Assessment Education Provided To: Patient Education Topics Provided Offloading: Methods: Explain/Verbal Responses: Reinforcements needed, State content correctly Wound/Skin Impairment: Methods: Explain/Verbal Responses: Reinforcements needed, State content correctly Electronic Signature(s) Signed: 11/28/2021 11:36:31 AM By: Baruch Gouty RN, BSN Entered By: Baruch Gouty on 11/28/2021 09:18:55 -------------------------------------------------------------------------------- Wound Assessment Details Patient Name: Date of Service: [name] [name], [place]. 11/28/2021 9:15 A M Medical Record Number: [id number] Patient Account Number: [id number] Date of Birth/Sex: Treating RN: 06-21-1971 (51 y.o. [name] [name] Primary Care Abbygail [place]: [name] Other Clinician: Referring  Caylen Kuwahara: Treating Alizzon Dioguardi/Extender: Delano Metz in Treatment: 48 Wound Status Wound Number: 2R Primary Diabetic Wound/Ulcer of the Lower Extremity Etiology: Wound Location: Left Metatarsal head first Wound Status: Open Wounding Event: Blister Comorbid Anemia, Hypertension, Peripheral Venous Disease, Type II Date Acquired: 06/23/2020 History: Diabetes Weeks Of Treatment: 67 Clustered Wound: No Photos Wound Measurements Length: (cm) 0.7 Width: (cm) 0.1 Depth: (cm) 0.6 Area: (cm) 0.055 Volume: (cm) 0.033 % Reduction in Area: -77.4% % Reduction in Volume: -1000% Epithelialization: Medium (34-66%) Tunneling: No Undermining: Yes Starting Position (o'clock): 12 Ending Position (o'clock): 12 Maximum Distance: (cm) 0.3 Wound Description Classification: Grade 2 Wound Margin: Thickened Exudate Amount: Medium Exudate Type: Serosanguineous Exudate Color: red, brown Foul Odor After Cleansing: No Slough/Fibrino No Wound Bed Granulation Amount: Large (67-100%) Exposed Structure Granulation Quality: Red Fascia Exposed: No Necrotic Amount: None Present (0%) Fat Layer (Subcutaneous Tissue) Exposed: Yes Tendon Exposed: No Muscle Exposed: No Joint Exposed: No Bone Exposed: No Treatment Notes Wound #2R (Metatarsal head first) Wound Laterality: Left Cleanser Soap and Water Discharge Instruction: May shower and wash wound with dial antibacterial soap and water prior to dressing change. Wound Cleanser Discharge Instruction: Cleanse the wound with wound cleanser prior to applying a clean dressing using gauze sponges, not tissue or cotton balls. Peri-Wound Care Sween Lotion (Moisturizing lotion) Discharge Instruction: Apply moisturizing lotion to leg Topical Primary Dressing Affinity Secondary Dressing Woven Gauze Sponges 2x2 in Discharge Instruction: Apply over primary dressing as directed. Optifoam Non-Adhesive Dressing, 4x4 in Discharge  Instruction: cut to make foam donut to help offload ADAPTIC TOUCH 3x4.25 in Discharge Instruction: Apply over primary dressing as directed. Secured With [employer], 4.5x3.1 (in/yd) Discharge Instruction: Secure with Kerlix as directed. 42M Medipore H Soft Cloth Surgical T ape, 2x2 (in/yd) Discharge Instruction: Secure dressing with tape as directed. Compression Wrap Compression Stockings Add-Ons Electronic Signature(s) Signed: 11/28/2021 11:36:31 AM By: Baruch Gouty RN, BSN Signed: 11/28/2021 11:40:57 AM By: Deon Pilling RN, BSN Entered By: Deon Pilling on  11/28/2021 09:18:40 -------------------------------------------------------------------------------- Vitals Details Patient Name: Date of Service: [name] [name] 11/28/2021 9:15 A M Medical Record Number: [id number] Patient Account Number: [id number] Date of Birth/Sex: Treating RN: 03-06-1971 (51 y.o. [name] [name] Primary Care Dawayne Ohair: [name] Other Clinician: Referring Dorri Ozturk: Treating Laresha Bacorn/Extender: Delano Metz in Treatment: 90 Vital Signs Time Taken: 09:11 Temperature (F): 97.7 Height (in): 77 Pulse (bpm): 87 Source: Stated Respiratory Rate (breaths/min): 18 Weight (lbs): 232 Blood Pressure (mmHg): 144/83 Source: Stated Reference Range: 80 - 120 mg / dl Body Mass Index (BMI): 27.5 Electronic Signature(s) Signed: 11/28/2021 11:36:31 AM By: Baruch Gouty RN, BSN Entered By: Baruch Gouty on 11/28/2021 09:12:04

## 2021-12-03 ENCOUNTER — Encounter (HOSPITAL_COMMUNITY): Payer: Medicare Other

## 2021-12-08 ENCOUNTER — Encounter (HOSPITAL_BASED_OUTPATIENT_CLINIC_OR_DEPARTMENT_OTHER): Payer: Medicare Other | Admitting: Internal Medicine

## 2021-12-09 ENCOUNTER — Encounter (HOSPITAL_BASED_OUTPATIENT_CLINIC_OR_DEPARTMENT_OTHER): Payer: Medicare Other | Admitting: Internal Medicine

## 2021-12-09 ENCOUNTER — Other Ambulatory Visit: Payer: Self-pay

## 2021-12-09 ENCOUNTER — Encounter (HOSPITAL_COMMUNITY): Payer: Self-pay

## 2021-12-09 DIAGNOSIS — L97522 Non-pressure chronic ulcer of other part of left foot with fat layer exposed: Secondary | ICD-10-CM | POA: Diagnosis not present

## 2021-12-09 DIAGNOSIS — E11621 Type 2 diabetes mellitus with foot ulcer: Secondary | ICD-10-CM

## 2021-12-10 NOTE — Progress Notes (Signed)
TECUMSEH, YEAGLEY ([id number]) Visit Report for 12/09/2021 Chief Complaint Document Details Patient Name: Date of Service: [name] [name] [name] 12/09/2021 1:30 PM Medical Record Number: [id number] Patient Account Number: [id number] Date of Birth/Sex: Treating RN: Apr 12, 1971 (51 y.o. [name] Primary Care Provider: [name] Other Clinician: Referring Provider: Treating Provider/Extender: Delano Metz in Treatment: 34 Information Obtained from: Patient Chief Complaint patient is here for a review of the wound on his plantar left first metatarsal head and back of his right leg Electronic Signature(s) Signed: 12/09/2021 1:33:56 PM By: Kalman Shan DO Entered By: Kalman Shan on 12/09/2021 13:28:29 -------------------------------------------------------------------------------- Cellular or Tissue Based Product Details Patient Name: Date of Service: [name], [name] 12/09/2021 1:30 PM Medical Record Number: [id number] Patient Account Number: [id number] Date of Birth/Sex: Treating RN: 15-Oct-1971 (51 y.o. [name] Primary Care Provider: [name] Other Clinician: Referring Provider: Treating Provider/Extender: Delano Metz in Treatment: 56 Cellular or Tissue Based Product Type Wound #2R Left Metatarsal head first Applied to: Performed By: Physician Kalman Shan, DO Cellular or Tissue Based Product Type: Affinity Level of Consciousness (Pre-procedure): Awake and Alert Pre-procedure Verification/Time Out Yes - 13:20 Taken: Location: genitalia / hands / feet / multiple digits Wound Size (sq cm): 0.18 Product Size (sq cm): 3 Waste Size (sq cm): 0 Amount of Product Applied (sq cm): 3 Instrument Used: Forceps Lot #: [id number] Order #: 9 Expiration Date: 12/10/2021 Fenestrated: No Reconstituted: No Secured: Yes Secured With: Steri-Strips Dressing Applied: Yes Primary Dressing:  adaptic, gauze, foam Procedural Pain: 0 Post Procedural Pain: 0 Response to Treatment: Procedure was tolerated well Level of Consciousness (Post- Awake and Alert procedure): Post Procedure Diagnosis Same as Pre-procedure Electronic Signature(s) Signed: 12/09/2021 1:33:56 PM By: Kalman Shan DO Signed: 12/10/2021 4:46:25 PM By: Baruch Gouty RN, BSN Entered By: Baruch Gouty on 12/09/2021 13:23:42 -------------------------------------------------------------------------------- Debridement Details Patient Name: Date of Service: [name] Shire ND, [name] 12/09/2021 1:30 PM Medical Record Number: [id number] Patient Account Number: [id number] Date of Birth/Sex: Treating RN: [birth year] (51 y.o. [name] Primary Care Provider: [name] Other Clinician: Referring Provider: Treating Provider/Extender: Delano Metz in Treatment: 68 Debridement Performed for Assessment: Wound #2R Left Metatarsal head first Performed By: Physician Kalman Shan, DO Debridement Type: Debridement Severity of Tissue Pre Debridement: Fat layer exposed Level of Consciousness (Pre-procedure): Awake and Alert Pre-procedure Verification/Time Out Yes - 13:15 Taken: Start Time: 13:15 Pain Control: Other : benzocaine 20% spray T Area Debrided (L x W): otal 0.9 (cm) x 0.5 (cm) = 0.45 (cm) Tissue and other material debrided: Viable, Non-Viable, Callus, Subcutaneous, Skin: Epidermis Level: Skin/Subcutaneous Tissue Debridement Description: Excisional Instrument: Curette Bleeding: Minimum Hemostasis Achieved: Pressure Procedural Pain: 0 Post Procedural Pain: 0 Response to Treatment: Procedure was tolerated well Level of Consciousness (Post- Awake and Alert procedure): Post Debridement Measurements of Total Wound Length: (cm) 0.9 Width: (cm) 0.3 Depth: (cm) 0.7 Volume: (cm) 0.148 Character of Wound/Ulcer Post Debridement: Improved Severity of Tissue Post  Debridement: Fat layer exposed Post Procedure Diagnosis Same as Pre-procedure Electronic Signature(s) Signed: 12/09/2021 1:33:56 PM By: Kalman Shan DO Signed: 12/10/2021 4:46:25 PM By: Baruch Gouty RN, BSN Entered By: Baruch Gouty on 12/09/2021 13:21:41 -------------------------------------------------------------------------------- HPI Details Patient Name: Date of Service: [name], [name] 12/09/2021 1:30 PM Medical Record Number: [id number] Patient Account Number: [id number] Date of Birth/Sex: Treating RN: 21-Nov-1971 (51 y.o. [name] Primary Care Provider: [name] Other Clinician: Referring Provider: Treating Provider/Extender: Heber [place]  [place], Milon Dikes in Treatment: 68 History of Present Illness HPI Description: 04/21/18 ADMISSION This is a 51 year old man who is a type II diabetic. In spite of fact his hemoglobin A1c is actually quite good 5.73 months ago he is at a really difficult time over the last 6-7 months. He developed a rapidly progressive infection in the right foot in November 2018 associated with osteomyelitis and necrotizing fasciitis. 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 The stump required and a revision on 11/24/17. The stump revision was advertised as being secondary to falls. I'm not sure the progressive history here however this area is actually closed over. The patient tells me over the same timeframe he has had wounds on the plantar aspect of his left foot. In the ED saw Dr. Sharol Given in April of this year. 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 It is really not clear that this patient is been dressing this with anything. He came in with the clinic without any specific dressing on the wound areas using his own tennis shoe. He tells me that he only ambulates of course to do a pivot transfer. He does not have his prosthesis for the right leg as of yet and he blames Medicaid for  this. He does however use the foot to push himself along in his wheelchair at home. The patient has not had formal arterial studies. ABI in our clinic on the left was 1.13. Patient's past medical history includes type 2 diabetes, fracture of the right fibula. I see that he was treated for abscesses on his right buttock and chest in 2016. I did not look at the microbiology of this. 04/28/18; patient comes back in the clinic today with the wound pretty much the same as when he came in here last week. Small opening lots of undermining relatively. He tells [place] that nothing is really been on this for 3 days in spite of the fact that we gave him enough to dress this easily especially such a small wound. He says he lives with his mother, she is not capable of assisting with this he is changing the dressings himself. 05/05/18; much better-looking wound today. Smaller. There is some undermining medially however that's only perhaps 2 mm. No surrounding erythema. We've been using silver collagen 05/12/18; small wound on the first metatarsal head. No undermining no surrounding erythema. We've been using silver collagen he has home health coming out to change 05/20/18 the wound on the first metatarsal head looks better. Covered in surface debris/callus nonviable tissue. Required debridement but post debridement this looks quite good. We've been using silver collagen. He has home health 05/27/18; first metatarsal head wound continues to improve. Just about completely closed. Still a lot of surface debridement callus. We've been using silver collagen 06/06/18; the first metatarsal wound is completely healed over. There is still a lot of callus. I gently removed some of this just to make sure that there was no open area and there is not. The patient mentions to me that his left leg has felt like "lead" for about a week READMISSION 08/16/2020 This is a 51 year old man with type 2 diabetes. He returns to clinic today with a  1 month history of a blister and callus over the first metatarsal head. This is in exactly the same area as when he was here in 2019. He has a right BKA and a prosthesis from a diabetic foot infection on the  right in 2018. He has standard running shoes on the left foot to match the area in his prosthesis. He has only been covering this area with a Band-Aid has not really been specifically dressing this. ABI in our clinic on the left was 1.16. This is essentially stable from the value in 2019 10/1 the patient has a linear wound over the left first metatarsal head. Again not a lot of callus thick skin around this that I removed with a #3 curette. This is not go deep to bone or muscle it does not look to be infected. 10/8 first metatarsal head. The wound looks like it is closing however this is going to be a difficult area to offload in the future. He has a size 15 shoe and he says his shoes are years old. The inserts in these current shoes are totally useless and I have advised him to replace these. He may need a new pair of shoes and I have he got original prosthesis at [facility] I have asked him to go back there. 11/9; the patient has not been here in more than a month. Not sure he was healed when he was here the last time. I told him he would have to get new shoes and certainly offload the area over the left first metatarsal head. He has done nothing of the kind. He arrives back in clinic with the same old new balance sneakers. A very large separating callus over the first metatarsal head on the left 11/16; he has purchased new shoes and has at least 2 insoles like I asked. The wound is measuring much smaller. He is using silver alginate he changes the dressing himself 11/30; wound is measurably smaller in length of about a half a centimeter. This is generally very little depth. We have been using silver alginate. He changes the dressing himself every second day. 12/14; wound is about  the same size. Significant undermining medially relative to the size of the wound. We have been using silver alginate. There is not a way to offload this area as he walks with a prosthesis on the right leg 12/28; wound is about the same size. Again he has undermining medially. I am using polymen on this 12/02/2020; the wound looks smaller. Still a senescent edge. We have been using polymen 1/24; the wound is come down a few millimeters. Still a senescent edge. I have been using polymen 2/8; the wound is come down slightly. Still with slight undermining from 12-6 o'clock I have been using polymen partially to get offloading on this area which is opposed by his prosthesis on the other leg. 2/22 quite open improvement he still has a comma shaped wound on the left first metatarsal head. Some depth. Using polymen 3/14; he still has the same problem of a comma shaped area over the left first metatarsal head. This seems to have had more depth today at 0.6 cm. We have been using polymen. The patient changes this himself every second day. I explored the idea of a total contact cast on the left leg however this is his driving foot. He was not enamored with the idea of not being able to drive and states that he does not have anybody else to get him around 3/28; again he comes in with a smaller looking wound however there is depth and undermining requiring debridement. We have been using Hydrofera Blue, changed to silver collagen today. The patient is doing the dressing himself 4/11; patient  presents today for follow-up of his left diabetic foot wound. He denies any issues in the past 2 weeks. He is currently using silver collagen every other day with dressing changes. He does not use diabetic shoes or special inserts to his sneakers. He does not use felt pads for offloading. 4/19; patient presents for his 1 week follow-up. He denies any issues. He reports using Hydrofera Blue and has not been using silver  collagen for dressing changes. He reports minimal drainage to the wound. He overall feels well. 5/3; patient presents for 2-week follow-up. He has been using silver collagen without issues. He has no complaints today and states he overall feels well. 5/17; patient presents for 2-week follow-up. He states that the sleeve is rubbing up against the back of his right leg and causing a wound. He states this happened a week ago and has not been dressing the wound. The plantar wound is stable and he has been using collagen every other day. 5/24; Patient was had a recent hospitalization for cellulitis of his right popliteal fossa. He was admitted and started on IV antibiotics. He does not recall the plantar wound dressed while inpatient. He feels a lot better today. He was discharged on 5/21 on oral antibiotics and has not picked this up until today. He reports using collagen to the plantar wound since discharge. He is keeping the right posterior knee wound covered. He is getting a new sleeve for his prostatic on 5/26. He currently denies signs of infection. 6/1; patient presents for 1 week follow-up. He is still on doxycycline. He has not been dressing the back of the right knee wound. He states he is receiving his new prosthetic sleeve tomorrow. He states he is using silver alginate to the plantar foot wound. He currently denies signs of infection. He states he has not been offloading the left foot wound 6/7; patient presents for 1 week follow-up. He completes his last dose of doxycycline today. He finally received his prosthetic sleeve for the right side and he states it feels much better. He has been using antibiotic ointment to the back of that right knee wound. He has been using silver alginate to the plantar foot wound. He currently denies signs of infection. 6/21; patient presents for 2-week follow-up. He reports no issues with his prosthetic sleeve. He has been using antibiotic ointment to the back  of the right knee. He has been using silver alginate to the plantar foot wound. He has no issues or complaints today. He denies signs of infection. 7/5; patient presents for 2-week follow-up. He reports no issues or complaints today. He continues to use antibiotic ointment to the right posterior knee wound and silver alginate to the left plantar foot wound. He denies signs of infection. 7/19; patient presents for 2-week follow-up. He continues to use antibiotic ointment to the right posterior knee and silver alginate to the left plantar foot wound. He has no issues or complaints today. He denies signs of infection. 8/4; patient presents for 2-week follow-up. He has no issues or complaints today. He denies signs of infection. 8/18; patient presents for 2-week follow-up. He has no issues or concerns today. He has been approved for Apligraf and he would like to have this placed today. He denies signs of infection and reports that his right posterior knee wound is closed. 8/25; patient presents for 1 week follow-up. He had no issues with his first Apligraf placement last week. He did however develop an abscess to the back  of his right knee. He reports tenderness to this area. He denies systemic signs of infection. 9/1; patient presents for 1 week follow-up. He has had no issues with his plantar foot wound. The Apligraf has stayed in place over the past week. A wound culture was done at last clinic visit and a new antibiotic was sent to the pharmacy. Patient has not received this yet. He reports some mild tenderness to the back of his right knee. He denies systemic signs of infection. 9/8; patient presents for 1 week follow-up. He states he took Keflex and had diarrhea as a side effect. He was able to complete the course. He reports continued tenderness to the right posterior knee wound. He has some drainage still. He denies increased warmth or erythema to the area. He denies fever/chills or  nausea/vomiting. He has no issues or complaints today about his plantar foot wound. 9/15; patient presents for follow-up. He reports improvement in pain to the back of his right leg. He does not report any drainage. He had an ultrasound completed on 9/9 that showed a superficial fluid collection concerning for abscess. We attempted to call patient with results with no answer. We did put an urgent referral to general surgery and he reports he has not received a call. He currently denies systemic signs of infection. He reports no issues to his left plantar foot wound. 9/27; patient presents for follow-up. He has no issues or complaints today. He has had no issues with the previous wound to the back of his right leg. He has not made an appointment to see general surgery. He had Apligraf placed at last clinic visit to the plantar foot wound. He currently denies signs of infection. 10/7; patient presents for follow-up. Upon entering the room there is vomit throughout the entire floor. Patient states he does not feel well. I recommended he go to urgent care. I recommended following up next week for wound care. 10/10; patient presents for follow-up. He came last week for his follow-up however he was ill and started vomiting in the exam room. Today he states he feels better however he continues to have vomiting episodes. He states he attempted to go to urgent care/the ED but could not find a parking spot. He subsequently went home. He has no issues or complaints today regarding the foot wound. He never followed up with general surgery for the abscess identified on ultrasound to the back of his right knee. 10/20; patient presents for follow-up. He has no issues or complaints today. Overall he feels well today. 10/27; patient presents for follow-up. He has no issues or complaints today. 11/3; patient presents for follow-up. He has no issues or complaints today. He states he does not want to follow-up with  orthopedics to reevaluate the abscess in the back of his right leg. He denies signs of infection. 11/15; patient presents for follow-up. He has no issues or complaints today. He had been using silver alginate to the left first met head wound. He denies signs of infection. 11/22; patient presents for follow-up. He had Affinity placed at last clinic visit. He has no issues or complaints today. 11/29; patient presents for follow-up. He is in good spirits today. He has no issues or complaints today. 12/6; patient presents for follow-up. He has no issues or complaints today. 12/13; patient presents for follow-up. He denies signs of infection. He has no issues or complaints today. 12/20; patient presents for follow-up. He has no issues or complaints today. He denies  signs of infection. 12/29; patient presents for follow-up. He states that a few days ago he has had reaccumulation of an abscess behind the right knee. He denies increased warmth or erythema to the area. He denies fever/chills or drainage. 1/6; patient presents for follow-up. He states he went to the [facility] emergency department to be evaluated for the abscess to his right posterior knee. He was started on clindamycin. They were unable to drain the abscess. Patient states that eventually the abscess ruptured. He reports improvement in his symptoms. He has no issues or complaints about his left foot wound. 1/17; patient presents for follow-up. He has no issues or complaints today. Electronic Signature(s) Signed: 12/09/2021 1:33:56 PM By: Kalman Shan DO Entered By: Kalman Shan on 12/09/2021 13:30:53 -------------------------------------------------------------------------------- Physical Exam Details Patient Name: Date of Service: [name], [name] 12/09/2021 1:30 PM Medical Record Number: [id number] Patient Account Number: [id number] Date of Birth/Sex: Treating RN: September 05, 1971 (51 y.o. [name] Primary Care  Provider: [name] Other Clinician: Referring Provider: Treating Provider/Extender: Delano Metz in Treatment: 8 Constitutional respirations regular, non-labored and within target range for patient.. Cardiovascular 2+ dorsalis pedis/posterior tibialis pulses. Psychiatric pleasant and cooperative. Notes Left leg: T the first met head there is an open wound with granulation tissue, circumferential callus and nonviable tissue. No signs of infection o Electronic Signature(s) Signed: 12/09/2021 1:33:56 PM By: Kalman Shan DO Entered By: Kalman Shan on 12/09/2021 13:31:49 -------------------------------------------------------------------------------- Physician Orders Details Patient Name: Date of Service: [name], [name] 12/09/2021 1:30 PM Medical Record Number: [id number] Patient Account Number: [id number] Date of Birth/Sex: Treating RN: 18-Aug-1971 (51 y.o. [name] Primary Care Provider: [name] Other Clinician: Referring Provider: Treating Provider/Extender: Delano Metz in Treatment: 7 Verbal / Phone Orders: No Diagnosis Coding ICD-10 Coding Code Description [id number] Non-pressure chronic ulcer of other part of left foot with fat layer exposed E11.621 Type 2 diabetes mellitus with foot ulcer E11.42 Type 2 diabetes mellitus with diabetic polyneuropathy Z89.511 Acquired absence of right leg below knee Follow-up Appointments ppointment in 1 week. - with Dr. Heber [place] Return A Cellular or Tissue Based Products Cellular or Tissue Based Product Type: - Affinity #9 Cellular or Tissue Based Product applied to wound bed, secured with steri-strips, cover with Adaptic or Mepitel. (DO NOT REMOVE). Edema Control - Lymphedema / SCD / Other Elevate legs to the level of the heart or above for 30 minutes daily and/or when sitting, a frequency of: - throughout the day Avoid standing for long periods of  time. Off-Loading Other: - minimize walking on left foot related to pressure to wound. Aid in wound healing. Wound Treatment Wound #2R - Metatarsal head first Wound Laterality: Left Cleanser: Soap and Water 1 x Per Week/15 Days Discharge Instructions: May shower and wash wound with dial antibacterial soap and water prior to dressing change. Cleanser: Wound Cleanser (Generic) 1 x Per Week/15 Days Discharge Instructions: Cleanse the wound with wound cleanser prior to applying a clean dressing using gauze sponges, not tissue or cotton balls. Peri-Wound Care: Sween Lotion (Moisturizing lotion) 1 x Per Week/15 Days Discharge Instructions: Apply moisturizing lotion to leg Prim Dressing: Affinity ary 1 x Per Week/15 Days Secondary Dressing: Woven Gauze Sponges 2x2 in (Generic) 1 x Per Week/15 Days Discharge Instructions: Apply over primary dressing as directed. Secondary Dressing: Optifoam Non-Adhesive Dressing, 4x4 in (Generic) 1 x Per Week/15 Days Discharge Instructions: cut to make foam donut to help offload Secondary Dressing: ADAPTIC TOUCH 3x4.25  in 1 x Per Week/15 Days Discharge Instructions: Apply over primary dressing as directed. Secured With: 69M Medipore H Soft Cloth Surgical Tape, 2x2 (in/yd) (Generic) 1 x Per Week/15 Days Discharge Instructions: Secure dressing with tape as directed. Electronic Signature(s) Signed: 12/09/2021 1:33:56 PM By: Kalman Shan DO Signed: 12/10/2021 4:46:25 PM By: Baruch Gouty RN, BSN Entered By: Baruch Gouty on 12/09/2021 13:32:37 -------------------------------------------------------------------------------- Problem List Details Patient Name: Date of Service: [name], [name] 12/09/2021 1:30 PM Medical Record Number: [id number] Patient Account Number: [id number] Date of Birth/Sex: Treating RN: 04-10-1971 (51 y.o. [name] Primary Care Provider: [name] Other Clinician: Referring Provider: Treating Provider/Extender:  Delano Metz in Treatment: 29 Active Problems ICD-10 Encounter Code Description Active Date MDM Diagnosis L97.522 Non-pressure chronic ulcer of other part of left foot with fat layer exposed 08/16/2020 No Yes E11.621 Type 2 diabetes mellitus with foot ulcer 08/16/2020 No Yes E11.42 Type 2 diabetes mellitus with diabetic polyneuropathy 08/16/2020 No Yes Z89.511 Acquired absence of right leg below knee 08/16/2020 No Yes Inactive Problems Resolved Problems ICD-10 Code Description Active Date Resolved Date S81.801D Unspecified open wound, right lower leg, subsequent encounter 04/08/2021 04/08/2021 Electronic Signature(s) Signed: 12/09/2021 1:33:56 PM By: Kalman Shan DO Entered By: Kalman Shan on 12/09/2021 13:27:42 -------------------------------------------------------------------------------- Progress Note Details Patient Name: Date of Service: [place] ND, [name] 12/09/2021 1:30 PM Medical Record Number: [id number] Patient Account Number: [id number] Date of Birth/Sex: Treating RN: 1971/02/25 (51 y.o. [name] Primary Care Provider: [name] Other Clinician: Referring Provider: Treating Provider/Extender: Delano Metz in Treatment: 36 Subjective Chief Complaint Information obtained from Patient patient is here for a review of the wound on his plantar left first metatarsal head and back of his right leg History of Present Illness (HPI) 04/21/18 ADMISSION This is a 51 year old man who is a type II diabetic. In spite of fact his hemoglobin A1c is actually quite good 5.73 months ago he is at a really difficult time over the last 6-7 months. He developed a rapidly progressive infection in the right foot in November 2018 associated with osteomyelitis and necrotizing fasciitis. 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 The stump required and a revision on 11/24/17. The stump revision was advertised as being secondary to  falls. I'm not sure the progressive history here however this area is actually closed over. The patient tells me over the same timeframe he has had wounds on the plantar aspect of his left foot. In the ED saw Dr. Sharol Given in April of this year. 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 It is really not clear that this patient is been dressing this with anything. He came in with the clinic without any specific dressing on the wound areas using his own tennis shoe. He tells me that he only ambulates of course to do a pivot transfer. He does not have his prosthesis for the right leg as of yet and he blames Medicaid for this. He does however use the foot to push himself along in his wheelchair at home. The patient has not had formal arterial studies. ABI in our clinic on the left was 1.13. Patient's past medical history includes type 2 diabetes, fracture of the right fibula. I see that he was treated for abscesses on his right buttock and chest in 2016. I did not look at the microbiology of this. 04/28/18; patient comes back in the clinic today with  the wound pretty much the same as when he came in here last week. Small opening lots of undermining relatively. He tells [place] that nothing is really been on this for 3 days in spite of the fact that we gave him enough to dress this easily especially such a small wound. He says he lives with his mother, she is not capable of assisting with this he is changing the dressings himself. 05/05/18; much better-looking wound today. Smaller. There is some undermining medially however that's only perhaps 2 mm. No surrounding erythema. We've been using silver collagen 05/12/18; small wound on the first metatarsal head. No undermining no surrounding erythema. We've been using silver collagen he has home health coming out to change 05/20/18 the wound on the first metatarsal head looks better. Covered in surface debris/callus nonviable  tissue. Required debridement but post debridement this looks quite good. We've been using silver collagen. He has home health 05/27/18; first metatarsal head wound continues to improve. Just about completely closed. Still a lot of surface debridement callus. We've been using silver collagen 06/06/18; the first metatarsal wound is completely healed over. There is still a lot of callus. I gently removed some of this just to make sure that there was no open area and there is not. The patient mentions to me that his left leg has felt like "lead" for about a week READMISSION 08/16/2020 This is a 51 year old man with type 2 diabetes. He returns to clinic today with a 1 month history of a blister and callus over the first metatarsal head. This is in exactly the same area as when he was here in 2019. 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 He has standard running shoes on the left foot to match the area in his prosthesis. He has only been covering this area with a Band-Aid has not really been specifically dressing this. ABI in our clinic on the left was 1.16. This is essentially stable from the value in 2019 10/1 the patient has a linear wound over the left first metatarsal head. Again not a lot of callus thick skin around this that I removed with a #3 curette. This is not go deep to bone or muscle it does not look to be infected. 10/8 first metatarsal head. The wound looks like it is closing however this is going to be a difficult area to offload in the future. He has a size 15 shoe and he says his shoes are years old. The inserts in these current shoes are totally useless and I have advised him to replace these. He may need a new pair of shoes and I have he got original prosthesis at [facility] I have asked him to go back there. 11/9; the patient has not been here in more than a month. Not sure he was healed when he was here the last time. I told him  he would have to get new shoes and certainly offload the area over the left first metatarsal head. He has done nothing of the kind. He arrives back in clinic with the same old new balance sneakers. A very large separating callus over the first metatarsal head on the left 11/16; he has purchased new shoes and has at least 2 insoles like I asked. The wound is measuring much smaller. He is using silver alginate he changes the dressing himself 11/30; wound is measurably smaller in length of about a half  a centimeter. This is generally very little depth. We have been using silver alginate. He changes the dressing himself every second day. 12/14; wound is about the same size. Significant undermining medially relative to the size of the wound. We have been using silver alginate. There is not a way to offload this area as he walks with a prosthesis on the right leg 12/28; wound is about the same size. Again he has undermining medially. I am using polymen on this 12/02/2020; the wound looks smaller. Still a senescent edge. We have been using polymen 1/24; the wound is come down a few millimeters. Still a senescent edge. I have been using polymen 2/8; the wound is come down slightly. Still with slight undermining from 12-6 o'clock I have been using polymen partially to get offloading on this area which is opposed by his prosthesis on the other leg. 2/22 quite open improvement he still has a comma shaped wound on the left first metatarsal head. Some depth. Using polymen 3/14; he still has the same problem of a comma shaped area over the left first metatarsal head. This seems to have had more depth today at 0.6 cm. We have been using polymen. The patient changes this himself every second day. I explored the idea of a total contact cast on the left leg however this is his driving foot. He was not enamored with the idea of not being able to drive and states that he does not have anybody else to get him  around 3/28; again he comes in with a smaller looking wound however there is depth and undermining requiring debridement. We have been using Hydrofera Blue, changed to silver collagen today. The patient is doing the dressing himself 4/11; patient presents today for follow-up of his left diabetic foot wound. He denies any issues in the past 2 weeks. He is currently using silver collagen every other day with dressing changes. He does not use diabetic shoes or special inserts to his sneakers. He does not use felt pads for offloading. 4/19; patient presents for his 1 week follow-up. He denies any issues. He reports using Hydrofera Blue and has not been using silver collagen for dressing changes. He reports minimal drainage to the wound. He overall feels well. 5/3; patient presents for 2-week follow-up. He has been using silver collagen without issues. He has no complaints today and states he overall feels well. 5/17; patient presents for 2-week follow-up. He states that the sleeve is rubbing up against the back of his right leg and causing a wound. He states this happened a week ago and has not been dressing the wound. The plantar wound is stable and he has been using collagen every other day. 5/24; Patient was had a recent hospitalization for cellulitis of his right popliteal fossa. He was admitted and started on IV antibiotics. He does not recall the plantar wound dressed while inpatient. He feels a lot better today. He was discharged on 5/21 on oral antibiotics and has not picked this up until today. He reports using collagen to the plantar wound since discharge. He is keeping the right posterior knee wound covered. He is getting a new sleeve for his prostatic on 5/26. He currently denies signs of infection. 6/1; patient presents for 1 week follow-up. He is still on doxycycline. He has not been dressing the back of the right knee wound. He states he is receiving his new prosthetic sleeve tomorrow. He  states he is using silver alginate to the plantar foot  wound. He currently denies signs of infection. He states he has not been offloading the left foot wound 6/7; patient presents for 1 week follow-up. He completes his last dose of doxycycline today. He finally received his prosthetic sleeve for the right side and he states it feels much better. He has been using antibiotic ointment to the back of that right knee wound. He has been using silver alginate to the plantar foot wound. He currently denies signs of infection. 6/21; patient presents for 2-week follow-up. He reports no issues with his prosthetic sleeve. He has been using antibiotic ointment to the back of the right knee. He has been using silver alginate to the plantar foot wound. He has no issues or complaints today. He denies signs of infection. 7/5; patient presents for 2-week follow-up. He reports no issues or complaints today. He continues to use antibiotic ointment to the right posterior knee wound and silver alginate to the left plantar foot wound. He denies signs of infection. 7/19; patient presents for 2-week follow-up. He continues to use antibiotic ointment to the right posterior knee and silver alginate to the left plantar foot wound. He has no issues or complaints today. He denies signs of infection. 8/4; patient presents for 2-week follow-up. He has no issues or complaints today. He denies signs of infection. 8/18; patient presents for 2-week follow-up. He has no issues or concerns today. He has been approved for Apligraf and he would like to have this placed today. He denies signs of infection and reports that his right posterior knee wound is closed. 8/25; patient presents for 1 week follow-up. He had no issues with his first Apligraf placement last week. He did however develop an abscess to the back of his right knee. He reports tenderness to this area. He denies systemic signs of infection. 9/1; patient presents for 1 week  follow-up. He has had no issues with his plantar foot wound. The Apligraf has stayed in place over the past week. A wound culture was done at last clinic visit and a new antibiotic was sent to the pharmacy. Patient has not received this yet. He reports some mild tenderness to the back of his right knee. He denies systemic signs of infection. 9/8; patient presents for 1 week follow-up. He states he took Keflex and had diarrhea as a side effect. He was able to complete the course. He reports continued tenderness to the right posterior knee wound. He has some drainage still. He denies increased warmth or erythema to the area. He denies fever/chills or nausea/vomiting. He has no issues or complaints today about his plantar foot wound. 9/15; patient presents for follow-up. He reports improvement in pain to the back of his right leg. He does not report any drainage. He had an ultrasound completed on 9/9 that showed a superficial fluid collection concerning for abscess. We attempted to call patient with results with no answer. We did put an urgent referral to general surgery and he reports he has not received a call. He currently denies systemic signs of infection. He reports no issues to his left plantar foot wound. 9/27; patient presents for follow-up. He has no issues or complaints today. He has had no issues with the previous wound to the back of his right leg. He has not made an appointment to see general surgery. He had Apligraf placed at last clinic visit to the plantar foot wound. He currently denies signs of infection. 10/7; patient presents for follow-up. Upon entering the room there  is vomit throughout the entire floor. Patient states he does not feel well. I recommended he go to urgent care. I recommended following up next week for wound care. 10/10; patient presents for follow-up. He came last week for his follow-up however he was ill and started vomiting in the exam room. Today he states he  feels better however he continues to have vomiting episodes. He states he attempted to go to urgent care/the ED but could not find a parking spot. He subsequently went home. He has no issues or complaints today regarding the foot wound. He never followed up with general surgery for the abscess identified on ultrasound to the back of his right knee. 10/20; patient presents for follow-up. He has no issues or complaints today. Overall he feels well today. 10/27; patient presents for follow-up. He has no issues or complaints today. 11/3; patient presents for follow-up. He has no issues or complaints today. He states he does not want to follow-up with orthopedics to reevaluate the abscess in the back of his right leg. He denies signs of infection. 11/15; patient presents for follow-up. He has no issues or complaints today. He had been using silver alginate to the left first met head wound. He denies signs of infection. 11/22; patient presents for follow-up. He had Affinity placed at last clinic visit. He has no issues or complaints today. 11/29; patient presents for follow-up. He is in good spirits today. He has no issues or complaints today. 12/6; patient presents for follow-up. He has no issues or complaints today. 12/13; patient presents for follow-up. He denies signs of infection. He has no issues or complaints today. 12/20; patient presents for follow-up. He has no issues or complaints today. He denies signs of infection. 12/29; patient presents for follow-up. He states that a few days ago he has had reaccumulation of an abscess behind the right knee. He denies increased warmth or erythema to the area. He denies fever/chills or drainage. 1/6; patient presents for follow-up. He states he went to the [facility] emergency department to be evaluated for the abscess to his right posterior knee. He was started on clindamycin. They were unable to drain the abscess. Patient states that eventually the  abscess ruptured. He reports improvement in his symptoms. He has no issues or complaints about his left foot wound. 1/17; patient presents for follow-up. He has no issues or complaints today. Patient History Information obtained from Patient. Family History Diabetes - Maternal Grandparents,Mother, Heart Disease - Father, Hypertension - Father, No family history of Cancer, Hereditary Spherocytosis, Kidney Disease, Lung Disease, Seizures, Stroke, Thyroid Problems, Tuberculosis. Social History Current every day smoker, Marital Status - Single, Alcohol Use - Never, Drug Use - No History, Caffeine Use - Never. Medical History Eyes Denies history of Cataracts, Glaucoma, Optic Neuritis Ear/Nose/Mouth/Throat Denies history of Chronic sinus problems/congestion, Middle ear problems Hematologic/Lymphatic Patient has history of Anemia Denies history of Hemophilia, Human Immunodeficiency Virus, Lymphedema, Sickle Cell Disease Respiratory Denies history of Aspiration, Asthma, Chronic Obstructive Pulmonary Disease (COPD), Pneumothorax, Sleep Apnea, Tuberculosis Cardiovascular Patient has history of Hypertension, Peripheral Venous Disease Denies history of Angina, Arrhythmia, Congestive Heart Failure, Coronary Artery Disease, Deep Vein Thrombosis, Hypotension, Myocardial Infarction, Peripheral Arterial Disease, Phlebitis, Vasculitis Gastrointestinal Denies history of Cirrhosis , Colitis, Crohnoos, Hepatitis A, Hepatitis B, Hepatitis C Endocrine Patient has history of Type II Diabetes - oral meds Denies history of Type I Diabetes Genitourinary Denies history of End Stage Renal Disease Immunological Denies history of Lupus Erythematosus, Raynaudoos, Scleroderma Integumentary (Skin)  Denies history of History of Burn Musculoskeletal Denies history of Gout, Rheumatoid Arthritis, Osteoarthritis, Osteomyelitis Neurologic Denies history of Dementia, Neuropathy, Quadriplegia, Paraplegia, Seizure  Disorder Oncologic Denies history of Received Chemotherapy, Received Radiation Psychiatric Denies history of Anorexia/bulimia, Confinement Anxiety Hospitalization/Surgery History - fever 105, sepsis cellulitis right popliteal fossa 04/09/2021. Medical A Surgical History Notes nd Constitutional Symptoms (General Health) falls , leukocytosis Respiratory During waking hours and catches himself not breathing, "gasp for air". Saw Dr Wynonia Lawman, he couldn't find a reason Objective Constitutional respirations regular, non-labored and within target range for patient.. Vitals Time Taken: 1:01 PM, Height: 77 in, Source: Stated, Weight: 232 lbs, Source: Stated, BMI: 27.5, Temperature: 97.7 F, Pulse: 91 bpm, Respiratory Rate: 18 breaths/min, Blood Pressure: 174/93 mmHg, Capillary Blood Glucose: 273 mg/dl. General Notes: glucose per pt's libre at present Cardiovascular 2+ dorsalis pedis/posterior tibialis pulses. Psychiatric pleasant and cooperative. General Notes: Left leg: T the first met head there is an open wound with granulation tissue, circumferential callus and nonviable tissue. No signs of infection o Integumentary (Hair, Skin) Wound #2R status is Open. Original cause of wound was Blister. The date acquired was: 06/23/2020. The wound has been in treatment 68 weeks. The wound is located on the Left Metatarsal head first. The wound measures 0.9cm length x 0.2cm width x 0.7cm depth; 0.141cm^2 area and 0.099cm^3 volume. There is Fat Layer (Subcutaneous Tissue) exposed. There is no tunneling noted, however, there is undermining starting at 6:00 and ending at 12:00 with a maximum distance of 0.5cm. There is a medium amount of serosanguineous drainage noted. The wound margin is thickened. There is large (67-100%) red granulation within the wound bed. There is no necrotic tissue within the wound bed. 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 I debrided nonviable tissue. No signs of infection. Affinity #9 was placed in standard fashion today. I recommended continuing to aggressively offload the wound bed. Follow-up in 1 week Procedures Wound #2R Pre-procedure diagnosis of Wound #2R is a Diabetic Wound/Ulcer of the Lower Extremity located on the Left Metatarsal head first .Severity of Tissue Pre Debridement is: Fat layer exposed. There was a Excisional Skin/Subcutaneous Tissue Debridement with a total area of 0.45 sq cm performed by Kalman Shan, DO. With the following instrument(s): Curette to remove Viable and Non-Viable tissue/material. Material removed includes Callus, Subcutaneous Tissue, and Skin: Epidermis after achieving pain control using Other (benzocaine 20% spray). No specimens were taken. A time out was conducted at 13:15, prior to the start of the procedure. A Minimum amount of bleeding was controlled with Pressure. The procedure was tolerated well with a pain level of 0 throughout and a pain level of 0 following the procedure. Post Debridement Measurements: 0.9cm length x 0.3cm width x 0.7cm depth; 0.148cm^3 volume. Character of Wound/Ulcer Post Debridement is improved. Severity of Tissue Post Debridement is: Fat layer exposed. Post procedure Diagnosis Wound #2R: Same as Pre-Procedure Pre-procedure diagnosis of Wound #2R is a Diabetic Wound/Ulcer of the Lower Extremity located on the Left Metatarsal head first. A skin graft procedure using a bioengineered skin substitute/cellular or tissue based product was performed by Kalman Shan, DO with the following instrument(s): Forceps. Affinity was applied and secured with Steri-Strips. 3 sq cm of product was utilized and 0 sq cm was wasted. Post Application, adaptic, gauze, foam was  applied. A  Time Out was conducted at 13:20, prior to the start of the procedure. The procedure was tolerated well with a pain level of 0 throughout and a pain level of 0 following the procedure. Post procedure Diagnosis Wound #2R: Same as Pre-Procedure . Plan 1. In office sharp debridement 2. Affinity #9 placed in standard fashion 3. Follow-up in 1 week 4. Aggressively offload the wound bed Electronic Signature(s) Signed: 12/09/2021 1:33:56 PM By: Kalman Shan DO Entered By: Kalman Shan on 12/09/2021 13:33:25 -------------------------------------------------------------------------------- HxROS Details Patient Name: Date of Service: [name], [name] 12/09/2021 1:30 PM Medical Record Number: [id number] Patient Account Number: [id number] Date of Birth/Sex: Treating RN: 1971-07-08 (51 y.o. [name] Primary Care Provider: [name] Other Clinician: Referring Provider: Treating Provider/Extender: Delano Metz in Treatment: 61 Information Obtained From Patient Constitutional Symptoms (General Health) Medical History: Past Medical History Notes: falls , leukocytosis Eyes Medical History: Negative for: Cataracts; Glaucoma; Optic Neuritis Ear/Nose/Mouth/Throat Medical History: Negative for: Chronic sinus problems/congestion; Middle ear problems Hematologic/Lymphatic Medical History: Positive for: Anemia Negative for: Hemophilia; Human Immunodeficiency Virus; Lymphedema; Sickle Cell Disease Respiratory Medical History: Negative for: Aspiration; Asthma; Chronic Obstructive Pulmonary Disease (COPD); Pneumothorax; Sleep Apnea; Tuberculosis Past Medical History Notes: During waking hours and catches himself not breathing, "gasp for air". Saw Dr Wynonia Lawman, he couldn't find a reason Cardiovascular Medical History: Positive for: Hypertension; Peripheral Venous Disease Negative for: Angina; Arrhythmia; Congestive Heart Failure; Coronary Artery  Disease; Deep Vein Thrombosis; Hypotension; Myocardial Infarction; Peripheral Arterial Disease; Phlebitis; Vasculitis Gastrointestinal Medical History: Negative for: Cirrhosis ; Colitis; Crohns; Hepatitis A; Hepatitis B; Hepatitis C Endocrine Medical History: Positive for: Type II Diabetes - oral meds Negative for: Type I Diabetes Time with diabetes: 2014 Treated with: Oral agents Blood sugar tested every day: Yes Tested : Blood sugar testing results: Breakfast: 91 Genitourinary Medical History: Negative for: End Stage Renal Disease Immunological Medical History: Negative for: Lupus Erythematosus; Raynauds; Scleroderma Integumentary (Skin) Medical History: Negative for: History of Burn Musculoskeletal Medical History: Negative for: Gout; Rheumatoid Arthritis; Osteoarthritis; Osteomyelitis Neurologic Medical History: Negative for: Dementia; Neuropathy; Quadriplegia; Paraplegia; Seizure Disorder Oncologic Medical History: Negative for: Received Chemotherapy; Received Radiation Psychiatric Medical History: Negative for: Anorexia/bulimia; Confinement Anxiety Immunizations Pneumococcal Vaccine: Received Pneumococcal Vaccination: No Implantable Devices No devices added Hospitalization / Surgery History Type of Hospitalization/Surgery fever 105, sepsis cellulitis right popliteal fossa 04/09/2021 Family and Social History Cancer: No; Diabetes: Yes - Maternal Grandparents,Mother; Heart Disease: Yes - Father; Hereditary Spherocytosis: No; Hypertension: Yes - Father; Kidney Disease: No; Lung Disease: No; Seizures: No; Stroke: No; Thyroid Problems: No; Tuberculosis: No; Current every day smoker; Marital Status - Single; Alcohol Use: Never; Drug Use: No History; Caffeine Use: Never; Financial Concerns: No; Food, Clothing or Shelter Needs: No; Support System Lacking: No; Transportation Concerns: No Electronic Signature(s) Signed: 12/09/2021 1:33:56 PM By: Kalman Shan  DO Signed: 12/10/2021 4:46:25 PM By: Baruch Gouty RN, BSN Entered By: Kalman Shan on 12/09/2021 13:31:00 -------------------------------------------------------------------------------- [facility] Details Patient Name: Date of Service: [name], Salome. 12/09/2021 Medical Record Number: [id number] Patient Account Number: [id number] Date of Birth/Sex: Treating RN: May 20, 1971 (51 y.o. 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 Facility Procedures CPT4 Code: [id number] [facility] Description: E0063 AFFINITY, 1.5X 1.5 SQ CM 75 - SKIN SUB GRAFT FACE/NK/HF/G D-10 Diagnosis Description 97.522 Non-pressure chronic ulcer of other part of left foot with fat layer exposed 11.621 Type 2 diabetes mellitus with foot ulcer Modifier: 1 Quantity: 3 Physician Procedures : CPT4 Code Description Modifier [id number] - WC PHYS SKIN SUB GRAFT FACE/NK/HF/G ICD-10 Diagnosis Description L97.522 Non-pressure chronic ulcer of other part of left foot with fat layer exposed E11.621 Type 2 diabetes mellitus with foot ulcer Quantity: 1 Electronic Signature(s) Signed: 12/09/2021 1:33:56 PM By: Kalman Shan DO Entered By: Kalman Shan on 12/09/2021 13:33:40

## 2021-12-10 NOTE — Progress Notes (Signed)
IVOR, KISHI ([id number]) Visit Report for [name] Arrival Information Details Patient Name: Date of Service: [name] [name] [name] [name] 1:30 PM Medical Record Number: [id number] Patient Account Number: [id number] Date of Birth/Sex: Treating RN: [name] (51 y.o. [name] [name] Primary Care [name] [name]: [name] [name] Other Clinician: Referring [name] [name]: Treating [name] [name]/Extender: [name] [name] in Treatment: 26 Visit Information History Since Last Visit Added or deleted any medications: No Patient Arrived: Ambulatory Any new allergies or adverse reactions: No Arrival Time: 13:00 Had a fall or experienced change in No Accompanied By: self activities of daily living that [name] affect Transfer Assistance: None risk of falls: Patient Identification Verified: Yes Signs or symptoms of abuse/neglect since last visito No Secondary Verification Process Completed: Yes Hospitalized since last visit: No Patient Requires Transmission-Based Precautions: No Implantable device outside of the clinic excluding No Patient Has Alerts: No cellular tissue based products placed in the center since last visit: Has Dressing in Place as Prescribed: Yes Pain Present Now: No Electronic Signature(s) Signed: 12/10/2021 4:46:25 PM By: Baruch Gouty RN, BSN Entered By: Baruch Gouty on 12/09/2021 13:01:07 -------------------------------------------------------------------------------- Encounter Discharge Information Details Patient Name: Date of Service: [name] [name] [name], [name] [name] [name] 1:30 PM Medical Record Number: [id number] Patient Account Number: [id number] Date of Birth/Sex: Treating RN: [name] [name] (51 y.o. [name] [name] Primary Care [name] [name]: [name] [name] Other Clinician: Referring [name] [name]: Treating [name] [name]/Extender: [name] [name] in Treatment: 60 Encounter Discharge Information Items Post Procedure Vitals Discharge  Condition: Stable Temperature (F): 97.7 Ambulatory Status: Ambulatory Pulse (bpm): 91 Discharge Destination: Home Respiratory Rate (breaths/min): 18 Transportation: Private Auto Blood Pressure (mmHg): 174/93 Accompanied By: self Schedule Follow-up Appointment: Yes Clinical Summary of Care: Patient Declined Electronic Signature(s) Signed: 12/10/2021 4:46:25 PM By: Baruch Gouty RN, BSN Entered By: Baruch Gouty on 12/09/2021 13:40:32 -------------------------------------------------------------------------------- Lower Extremity Assessment Details Patient Name: Date of Service: [name] [name], [name] [name] [name] 1:30 PM Medical Record Number: [id number] Patient Account Number: [id number] Date of Birth/Sex: Treating RN: [birth year] (51 y.o. [name] [name] Primary Care [name] [name]: [name] [name] Other Clinician: Referring Antone Summons: Treating [name] [name]/Extender: [name] [name] in Treatment: 68 Edema Assessment Assessed: [name] [name]: No] [Right: No] Edema: [Left: Ye] [Right: s] Calf Left: Right: Point of Measurement: 50 cm From Medial Instep 39 cm Ankle Left: Right: Point of Measurement: 11 cm From Medial Instep 26 cm Vascular Assessment Pulses: Dorsalis Pedis Palpable: [Left:Yes] Electronic Signature(s) Signed: 12/10/2021 4:46:25 PM By: Baruch Gouty RN, BSN Entered By: Baruch Gouty on 12/09/2021 13:05:44 -------------------------------------------------------------------------------- Multi Wound Chart Details Patient Name: Date of Service: [name] [name] [name], [name] [name] [name] 1:30 PM Medical Record Number: [id number] Patient Account Number: [id number] Date of Birth/Sex: Treating RN: [name] (52 y.o. [name] [name] Primary Care [name] [name]: [name] [name] Other Clinician: Referring [name] [name]: Treating [name] [name]/Extender: [name] [name] in Treatment: 20 Vital Signs Height(in): 77 Capillary Blood Glucose(mg/dl):  273 Weight(lbs): 232 Pulse(bpm): 91 Body Mass Index(BMI): 28 Blood Pressure(mmHg): 174/93 Temperature(F): 97.7 Respiratory Rate(breaths/min): 18 Photos: [2R:Left Metatarsal head first] [N/A:N/A N/A] Wound Location: [2R:Blister] [N/A:N/A] Wounding Event: [2R:Diabetic Wound/Ulcer of the Lower] [N/A:N/A] Primary Etiology: [2R:Extremity Anemia, Hypertension, Peripheral] [N/A:N/A] Comorbid History: [2R:Venous Disease, Type II Diabetes 06/23/2020] [N/A:N/A] Date Acquired: [2R:68] [N/A:N/A] Weeks of Treatment: [2R:Open] [N/A:N/A] Wound Status: [2R:Yes] [N/A:N/A] Wound Recurrence: [2R:0.9x0.2x0.7] [N/A:N/A] Measurements L x W x D (cm) [2R:0.141] [N/A:N/A] A (cm) : rea [4H:9.977] [N/A:N/A] Volume (cm) : [2R:-354.80%] [N/A:N/A] % Reduction in A [2R:rea: -3200.00%] [N/A:N/A] % Reduction in Volume: [2R:6] Starting Position 1 (o'clock): [  2R:12] Ending Position 1 (o'clock): [2R:0.5] Maximum Distance 1 (cm): [2R:Yes] [N/A:N/A] Undermining: [2R:Grade 2] [N/A:N/A] Classification: [2R:Medium] [N/A:N/A] Exudate A mount: [2R:Serosanguineous] [N/A:N/A] Exudate Type: [2R:red, brown] [N/A:N/A] Exudate Color: [2R:Thickened] [N/A:N/A] Wound Margin: [2R:Large (67-100%)] [N/A:N/A] Granulation A mount: [2R:Red] [N/A:N/A] Granulation Quality: [2R:None Present (0%)] [N/A:N/A] Necrotic A mount: [2R:Fat Layer (Subcutaneous Tissue): Yes N/A] Exposed Structures: [2R:Fascia: No Tendon: No Muscle: No Joint: No Bone: No Medium (34-66%)] [N/A:N/A] Epithelialization: [2R:Debridement - Excisional] [N/A:N/A] Debridement: Pre-procedure Verification/Time Out 13:15 [N/A:N/A] Taken: [2R:Other] [N/A:N/A] Pain Control: [2R:Callus, Subcutaneous] [N/A:N/A] Tissue Debrided: [2R:Skin/Subcutaneous Tissue] [N/A:N/A] Level: [2R:0.45] [N/A:N/A] Debridement A (sq cm): [2R:rea Curette] [N/A:N/A] Instrument: [2R:Minimum] [N/A:N/A] Bleeding: [2R:Pressure] [N/A:N/A] Hemostasis A chieved: [2R:0] [N/A:N/A] Procedural Pain:  [2R:0] [N/A:N/A] Post Procedural Pain: [2R:Procedure was tolerated well] [N/A:N/A] Debridement Treatment Response: [2R:0.9x0.3x0.7] [N/A:N/A] Post Debridement Measurements L x W x D (cm) [2R:0.148] [N/A:N/A] Post Debridement Volume: (cm) [2R:Cellular or Tissue Based Product] [N/A:N/A] Procedures Performed: [2R:Debridement] Treatment Notes Electronic Signature(s) Signed: 12/09/2021 1:33:56 PM By: Kalman Shan DO Signed: 12/10/2021 4:46:25 PM By: Baruch Gouty RN, BSN Entered By: Kalman Shan on 12/09/2021 13:28:07 -------------------------------------------------------------------------------- Multi-Disciplinary Care Plan Details Patient Name: Date of Service: [name] [name] [name], [name] [name] [name] 1:30 PM Medical Record Number: [id number] Patient Account Number: [id number] Date of Birth/Sex: Treating RN: [name] (51 y.o. [name] [name] Primary Care Margrete Delude: [name] [name] Other Clinician: Referring Lessly Stigler: Treating Deyra Perdomo/Extender: [name] [name] in Treatment: 41 Multidisciplinary Care Plan reviewed with physician Active Inactive Abuse / Safety / Falls / Self Care Management Nursing Diagnoses: History of Falls Goals: Patient/caregiver will verbalize/demonstrate measures taken to prevent injury and/or falls Date Initiated: 11/04/2021 Target Resolution Date: 01/06/2022 Goal Status: Active Interventions: Assess fall risk on admission and as needed Assess impairment of mobility on admission and as needed per policy Notes: Wound/Skin Impairment Nursing Diagnoses: Impaired tissue integrity Goals: Patient/caregiver will verbalize understanding of skin care regimen Date Initiated: 11/19/2020 Target Resolution Date: 01/09/2022 Goal Status: Active Ulcer/skin breakdown will have a volume reduction of 30% by week 4 Date Initiated: 08/16/2020 Date Inactivated: 03/03/2021 Target Resolution Date: 03/10/2021 Goal Status:  Met Interventions: Provide education on ulcer and skin care Notes: 07/24/21: Wound care regimen ongoing. Electronic Signature(s) Signed: 12/10/2021 4:46:25 PM By: Baruch Gouty RN, BSN Entered By: Baruch Gouty on 12/09/2021 13:11:48 -------------------------------------------------------------------------------- Pain Assessment Details Patient Name: Date of Service: [name] [name], [name] [name] [name] 1:30 PM Medical Record Number: [id number] Patient Account Number: [id number] Date of Birth/Sex: Treating RN: [name][birth year] (51 y.o. [name] [name] Primary Care Baruc Tugwell: [name] [name] Other Clinician: Referring Kortni Hasten: Treating Osie Amparo/Extender: [name] [name] in Treatment: 81 Active Problems Location of Pain Severity and Description of Pain Patient Has Paino No Site Locations Rate the pain. Rate the pain. Current Pain Level: 0 Pain Management and Medication Current Pain Management: Electronic Signature(s) Signed: 12/10/2021 4:46:25 PM By: Baruch Gouty RN, BSN Entered By: Baruch Gouty on 12/09/2021 13:04:58 -------------------------------------------------------------------------------- Patient/Caregiver Education Details Patient Name: Date of Service: [name] [name] 1/17/2023andnbsp1:30 PM Medical Record Number: [id number] Patient Account Number: [id number] Date of Birth/Gender: Treating RN: [name][birth year] (51 y.o. [name] [name] Primary Care Physician: [name] [name] Other Clinician: Referring Physician: Treating Physician/Extender: [name] [name] in Treatment: 31 Education Assessment Education Provided To: Patient Education Topics Provided Wound/Skin Impairment: Methods: Explain/Verbal Responses: Reinforcements needed, State content correctly Electronic Signature(s) Signed: 12/10/2021 4:46:25 PM By: Baruch Gouty RN, BSN Entered By: Baruch Gouty on 12/09/2021  13:12:29 -------------------------------------------------------------------------------- Wound Assessment Details Patient Name: Date of Service: [facility]  [name], [name] [name] [name] 1:30 PM Medical Record Number: [id number] Patient Account Number: [id number] Date of Birth/Sex: Treating RN: [name] (51 y.o. [name], [name] Primary Care Rael Yo: [name] [name] Other Clinician: Referring Jamesha Ellsworth: Treating Royston Bekele/Extender: [name] [name] in Treatment: 66 Wound Status Wound Number: 2R Primary Diabetic Wound/Ulcer of the Lower Extremity Etiology: Wound Location: Left Metatarsal head first Wound Status: Open Wounding Event: Blister Comorbid Anemia, Hypertension, Peripheral Venous Disease, Type II Date Acquired: 06/23/2020 History: Diabetes Weeks Of Treatment: 68 Clustered Wound: No Photos Wound Measurements Length: (cm) 0.9 Width: (cm) 0.2 Depth: (cm) 0.7 Area: (cm) 0.141 Volume: (cm) 0.099 % Reduction in Area: -354.8% % Reduction in Volume: -3200% Epithelialization: Medium (34-66%) Tunneling: No Undermining: Yes Starting Position (o'clock): 6 Ending Position (o'clock): 12 Maximum Distance: (cm) 0.5 Wound Description Classification: Grade 2 Wound Margin: Thickened Exudate Amount: Medium Exudate Type: Serosanguineous Exudate Color: red, brown Foul Odor After Cleansing: No Slough/Fibrino No Wound Bed Granulation Amount: Large (67-100%) Exposed Structure Granulation Quality: Red Fascia Exposed: No Necrotic Amount: None Present (0%) Fat Layer (Subcutaneous Tissue) Exposed: Yes Tendon Exposed: No Muscle Exposed: No Joint Exposed: No Bone Exposed: No Treatment Notes Wound #2R (Metatarsal head first) Wound Laterality: Left Cleanser Soap and Water Discharge Instruction: [name] shower and wash wound with dial antibacterial soap and water prior to dressing change. Wound Cleanser Discharge Instruction: Cleanse the wound with wound cleanser  prior to applying a clean dressing using gauze sponges, not tissue or cotton balls. Peri-Wound Care Sween Lotion (Moisturizing lotion) Discharge Instruction: Apply moisturizing lotion to leg Topical Primary Dressing Affinity Secondary Dressing Woven Gauze Sponges 2x2 in Discharge Instruction: Apply over primary dressing as directed. Optifoam Non-Adhesive Dressing, 4x4 in Discharge Instruction: cut to make foam donut to help offload ADAPTIC TOUCH 3x4.25 in Discharge Instruction: Apply over primary dressing as directed. Secured With 5M [place] Surgical T ape, 2x2 (in/yd) Discharge Instruction: Secure dressing with tape as directed. Compression Wrap Compression Stockings Add-Ons Electronic Signature(s) Signed: 12/10/2021 12:02:35 PM By: Deon Pilling RN, BSN Signed: 12/10/2021 4:46:25 PM By: Baruch Gouty RN, BSN Entered By: Baruch Gouty on 12/09/2021 13:08:05 -------------------------------------------------------------------------------- Vitals Details Patient Name: Date of Service: [name] [name], [name] [name] [name] 1:30 PM Medical Record Number: [id number] Patient Account Number: [id number] Date of Birth/Sex: Treating RN: [name] (51 y.o. [name] [name] Primary Care Javonna Balli: [name] [name] Other Clinician: Referring Beuna Bolding: Treating Staci Dack/Extender: [name] [name] in Treatment: 40 Vital Signs Time Taken: 13:01 Temperature (F): 97.7 Height (in): 77 Pulse (bpm): 91 Source: Stated Respiratory Rate (breaths/min): 18 Weight (lbs): 232 Blood Pressure (mmHg): 174/93 Source: Stated Capillary Blood Glucose (mg/dl): 273 Body Mass Index (BMI): 27.5 Reference Range: 80 - 120 mg / dl Notes glucose per pt's libre at present [employer]) Signed: 12/10/2021 4:46:25 PM By: Baruch Gouty RN, BSN Entered By: Baruch Gouty on 12/09/2021 13:02:23

## 2021-12-12 ENCOUNTER — Other Ambulatory Visit: Payer: Self-pay

## 2021-12-12 ENCOUNTER — Encounter (HOSPITAL_COMMUNITY)
Admission: RE | Admit: 2021-12-12 | Discharge: 2021-12-12 | Disposition: A | Discharge status: Home / Self Care | Payer: Medicare Other | Source: Ambulatory Visit | Attending: Nephrology | Admitting: Nephrology

## 2021-12-12 ENCOUNTER — Encounter (HOSPITAL_COMMUNITY): Payer: Self-pay

## 2021-12-12 ENCOUNTER — Emergency Department (HOSPITAL_COMMUNITY): Payer: Medicare Other

## 2021-12-12 ENCOUNTER — Emergency Department (HOSPITAL_COMMUNITY)
Admission: EM | Admit: 2021-12-12 | Discharge: 2021-12-12 | Disposition: A | Discharge status: Home / Self Care | Payer: Medicare Other | Attending: Emergency Medicine | Admitting: Emergency Medicine

## 2021-12-12 VITALS — BP 176/84 | HR 96 | Resp 18

## 2021-12-12 DIAGNOSIS — Z79899 Other long term (current) drug therapy: Secondary | ICD-10-CM | POA: Diagnosis not present

## 2021-12-12 DIAGNOSIS — Z7984 Long term (current) use of oral hypoglycemic drugs: Secondary | ICD-10-CM | POA: Diagnosis not present

## 2021-12-12 DIAGNOSIS — R0789 Other chest pain: Secondary | ICD-10-CM | POA: Diagnosis present

## 2021-12-12 DIAGNOSIS — E1165 Type 2 diabetes mellitus with hyperglycemia: Secondary | ICD-10-CM | POA: Diagnosis not present

## 2021-12-12 DIAGNOSIS — E1122 Type 2 diabetes mellitus with diabetic chronic kidney disease: Secondary | ICD-10-CM | POA: Diagnosis not present

## 2021-12-12 DIAGNOSIS — N189 Chronic kidney disease, unspecified: Secondary | ICD-10-CM | POA: Diagnosis not present

## 2021-12-12 DIAGNOSIS — Z20822 Contact with and (suspected) exposure to covid-19: Secondary | ICD-10-CM | POA: Diagnosis not present

## 2021-12-12 DIAGNOSIS — I129 Hypertensive chronic kidney disease with stage 1 through stage 4 chronic kidney disease, or unspecified chronic kidney disease: Secondary | ICD-10-CM | POA: Insufficient documentation

## 2021-12-12 DIAGNOSIS — D649 Anemia, unspecified: Secondary | ICD-10-CM | POA: Diagnosis present

## 2021-12-12 DIAGNOSIS — D62 Acute posthemorrhagic anemia: Secondary | ICD-10-CM | POA: Diagnosis present

## 2021-12-12 DIAGNOSIS — N179 Acute kidney failure, unspecified: Secondary | ICD-10-CM | POA: Insufficient documentation

## 2021-12-12 DIAGNOSIS — Z7951 Long term (current) use of inhaled steroids: Secondary | ICD-10-CM | POA: Diagnosis not present

## 2021-12-12 DIAGNOSIS — R739 Hyperglycemia, unspecified: Secondary | ICD-10-CM

## 2021-12-12 LAB — RESP PANEL BY RT-PCR (FLU A&B, COVID) ARPGX2
Influenza A by PCR: NEGATIVE
Influenza B by PCR: NEGATIVE
SARS Coronavirus 2 by RT PCR: NEGATIVE

## 2021-12-12 LAB — CBG MONITORING, ED
Glucose-Capillary: 328 mg/dL — ABNORMAL HIGH (ref 70–99)
Glucose-Capillary: 246 mg/dL — ABNORMAL HIGH (ref 70–99)

## 2021-12-12 LAB — BASIC METABOLIC PANEL
Anion gap: 11 (ref 5–15)
BUN: 30 mg/dL — ABNORMAL HIGH (ref 6–20)
CO2: 19 mmol/L — ABNORMAL LOW (ref 22–32)
Calcium: 8.2 mg/dL — ABNORMAL LOW (ref 8.9–10.3)
Chloride: 103 mmol/L (ref 98–111)
Creatinine, Ser: 2.88 mg/dL — ABNORMAL HIGH (ref 0.61–1.24)
GFR, Estimated: 26 mL/min — ABNORMAL LOW (ref >60)
Glucose, Bld: 411 mg/dL — ABNORMAL HIGH (ref 70–99)
Potassium: 4.2 mmol/L (ref 3.5–5.1)
Sodium: 133 mmol/L — ABNORMAL LOW (ref 135–145)

## 2021-12-12 LAB — CBC
HCT: 28.9 % — ABNORMAL LOW (ref 39.0–52.0)
Hemoglobin: 8.7 g/dL — ABNORMAL LOW (ref 13.0–17.0)
MCH: 27.9 pg (ref 26.0–34.0)
MCHC: 30.1 g/dL (ref 30.0–36.0)
MCV: 92.6 fL (ref 80.0–100.0)
Platelets: 251 10*3/uL (ref 150–400)
RBC: 3.12 MIL/uL — ABNORMAL LOW (ref 4.22–5.81)
RDW: 15.9 % — ABNORMAL HIGH (ref 11.5–15.5)
WBC: 18 10*3/uL — ABNORMAL HIGH (ref 4.0–10.5)
nRBC: 0 % (ref 0.0–0.2)

## 2021-12-12 LAB — IRON AND TIBC
Iron: 62 ug/dL (ref 45–182)
Saturation Ratios: 21 % (ref 17.9–39.5)
TIBC: 288 ug/dL (ref 250–450)
UIBC: 226 ug/dL

## 2021-12-12 LAB — TROPONIN I (HIGH SENSITIVITY)
Troponin I (High Sensitivity): 8 ng/L (ref <18)
Troponin I (High Sensitivity): 8 ng/L (ref <18)

## 2021-12-12 LAB — FERRITIN: Ferritin: 36 ng/mL (ref 24–336)

## 2021-12-12 LAB — POCT HEMOGLOBIN-HEMACUE: Hemoglobin: 10.9 g/dL — ABNORMAL LOW (ref 13.0–17.0)

## 2021-12-12 MED ORDER — SODIUM CHLORIDE 0.9 % IV BOLUS
1000.0000 mL | Freq: Once | INTRAVENOUS | Status: AC
  Administered 2021-12-12: 1000 mL via INTRAVENOUS

## 2021-12-12 MED ORDER — EPOETIN ALFA-EPBX 10000 UNIT/ML IJ SOLN
INTRAMUSCULAR | Status: AC
  Filled 2021-12-12: qty 1

## 2021-12-12 MED ORDER — EPOETIN ALFA-EPBX 10000 UNIT/ML IJ SOLN
10000.0000 [IU] | INTRAMUSCULAR | Status: DC
  Administered 2021-12-12: 10000 [IU] via SUBCUTANEOUS

## 2021-12-12 NOTE — ED Provider Triage Note (Signed)
Emergency Medicine Provider Triage Evaluation Note  [name] [name] , a 51 y.o. male  was evaluated in triage.  Pt complains of chest pain, n/v. He states that his symptoms began 1 week ago with URI symptoms including cough, congestion. Now has chest pain that is exacerbated by coughing and is tender to palpation.  Review of Systems  Positive: Chest pain, n/v Negative: Fever, SOB  Physical Exam  BP (!) 145/81 (BP Location: Left Arm)    Pulse 97    Temp 97.6 F (36.4 C) (Oral)    Resp 18    Ht 6\' 5"  (1.956 m)    Wt 106.1 kg    SpO2 100%    BMI 27.75 kg/m  Gen:   Awake, no distress   Resp:  Normal effort  MSK:   Moves extremities without difficulty  Other:    Medical Decision Making  Medically screening exam initiated at 2:16 PM.  Appropriate orders placed.  [name] [name] was informed that the remainder of the evaluation will be completed by another provider, this initial triage assessment does not replace that evaluation, and the importance of remaining in the ED until their evaluation is complete.     [name] Face, PA-C 12/12/21 1418

## 2021-12-12 NOTE — ED Provider Notes (Signed)
[facility] EMERGENCY DEPARTMENT Provider Note    CSN: [id number] Arrival date & time: 12/12/21 1345  History Chief Complaint  Patient presents with   Chest Pain   Hyperglycemia    [name] [name] is a 51 y.o. male with history of CKD as well as poorly controlled DM, HTN. Patient continues smoking and admits to dietary indiscretion eating fast food frequently. He recently had his diabetes regimen adjusted to [place] and SSI. He is here today for chest pain he describes as grabbing in his L upper chest, worse with coughing. Pain has been constant for a week. No particular provoking or relieving factors otherwise. He reports nausea, vomiting ongoing for 'as long as he can remember'. He is going to wound care for an ulcer on his L foot. He reports nightly fevers of 100.28F every night for 'awhile'. She presents today mostly for the chest pain.    Home Medications Prior to Admission medications   Medication Sig Start Date End Date Taking? Authorizing Provider  Truddie Crumble ULTRA-THIN LANCETS MISC Check blood sugars before meals twice daily 11/19/17   Mack Hook, MD  amLODipine (NORVASC) 10 MG tablet Take 10 mg by mouth daily.    [provider]  ARIPiprazole (ABILIFY) 20 MG tablet Take 20 mg by mouth daily. 08/06/19   [provider]  atorvastatin (LIPITOR) 40 MG tablet Take 40 mg by mouth daily. 08/11/21   [provider]  baclofen (LIORESAL) 10 MG tablet 1/2 tab twice a day Patient taking differently: Take 20 mg by mouth 2 (two) times daily as needed for muscle spasms. 01/28/18   Mack Hook, MD  desvenlafaxine (PRISTIQ) 100 MG 24 hr tablet Take 100 mg by mouth daily. 02/20/20   [provider]  FARXIGA 10 MG TABS tablet Take 10 mg by mouth daily. 09/03/21   [provider]  FLUoxetine (PROZAC) 40 MG capsule Take 40 mg by mouth daily. 03/26/21   [provider]  fluticasone (FLONASE) 50 MCG/ACT nasal spray Place 2 sprays  into both nostrils daily as needed for allergies or rhinitis.  01/09/19   [provider]  gabapentin (NEURONTIN) 300 MG capsule 1 cap by mouth in morning and midday, 2 caps by mouth at bedtime Patient taking differently: Take 300-600 mg by mouth See admin instructions. Take one capsule (300 mg) by mouth in the morning and afternoon, take two capsules (600 mg) at bedtime 11/19/17   Mack Hook, MD  GI Cocktail (alum & mag hydroxide-simethicone/lidocaine)oral mixture Take 15 mLs by mouth 3 (three) times daily as needed. 09/11/21   Doran Stabler, MD  glipiZIDE (GLUCOTROL) 5 MG tablet 1/2 tab by mouth twice daily with meal Patient taking differently: Take 5 mg by mouth daily with supper. 11/19/17   Mack Hook, MD  glucose blood (AGAMATRIX PRESTO TEST) test strip Check blood sugars twice daily before meals 02/02/18   Mack Hook, MD  glucose blood test strip 1 each by Other route as needed for other. Use as instructed    [provider]  metoCLOPramide (REGLAN) 10 MG tablet Take 1 tablet (10 mg total) by mouth 2 (two) times daily. 09/09/21   Doran Stabler, MD  metoprolol tartrate (LOPRESSOR) 25 MG tablet Take 25 mg by mouth 2 (two) times daily. 02/21/21   [provider]  ondansetron (ZOFRAN) 4 MG tablet Take 1 tablet (4 mg total) by mouth 3 (three) times daily as needed for nausea or vomiting. 09/05/21   Sharyn Creamer,  MD  pantoprazole (PROTONIX) 40 MG tablet Take 1 tablet (40 mg total) by mouth 2 (two) times daily. 04/23/21   Daleen Bo, MD  sucralfate (CARAFATE) 1 GM/10ML suspension Take 10 mLs (1 g total) by mouth 4 (four) times daily -  with meals and at bedtime. 09/03/21   Richarda Osmond, MD  TRULICITY 1.5 JO/8.4ZY SOPN Inject 1.5 mg into the skin once a week. Friday's 07/30/21   [provider]     Allergies    Iron and Adhesive [tape]   Review of Systems   Review of Systems Please see HPI for pertinent positives and  negatives  Physical Exam BP (!) 184/104    Pulse 92    Temp 97.6 F (36.4 C) (Oral)    Resp 15    Ht 6\' 5"  (1.956 m)    Wt 106.1 kg    SpO2 98%    BMI 27.75 kg/m   Physical Exam Vitals and nursing note reviewed.  Constitutional:      Appearance: Normal appearance.  HENT:     Head: Normocephalic and atraumatic.     Nose: Nose normal.     Mouth/Throat:     Mouth: Mucous membranes are moist.  Eyes:     Extraocular Movements: Extraocular movements intact.     Conjunctiva/sclera: Conjunctivae normal.  Cardiovascular:     Rate and Rhythm: Normal rate.  Pulmonary:     Effort: Pulmonary effort is normal.     Breath sounds: Normal breath sounds.  Chest:     Chest wall: Tenderness (L upper chest) present.  Abdominal:     General: Abdomen is flat.     Palpations: Abdomen is soft.     Tenderness: There is no abdominal tenderness.  Musculoskeletal:        General: No swelling. Normal range of motion.     Cervical back: Neck supple.     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  Neurological:     General: No focal deficit present.     Mental Status: He is alert.  Psychiatric:        Mood and Affect: Mood normal.    ED Results / Procedures / Treatments   EKG EKG Interpretation  Date/Time:  Friday December 12 2021 13:55:05 EST Ventricular Rate:  96 PR Interval:  168 QRS Duration: 87 QT Interval:  354 QTC Calculation: 448 R Axis:   76 Text Interpretation: Sinus rhythm Consider left atrial enlargement Posterior infarct, old Borderline ST elevation, inferior leads No significant change since last tracing Confirmed by Calvert Cantor [phone]) on 12/12/2021 4:03:03 PM  Procedures Procedures  Medications Ordered in the ED Medications  sodium chloride 0.9 % bolus 1,000 mL (0 mLs Intravenous Stopped 12/12/21 1933)    Initial Impression and Plan  Patient with multiple medical  problems, poorly controlled, here for evaluation of atypical CP. He answers yes to most questions regarding his symptoms although many of them seem to be chronic in nature. He does not have any ischemic changes on EKG. His CXR is clear. CBC shows leukocytosis, no recent to compare. He has CKD improved from previous with hyperglycemia but normal anion gap on BMP. He has a neg initial Trop. Will check second trop and continue to monitor. Currently normal sinus rhythm.   ED Course   Clinical Course as of 12/12/21 1950  Fri  Dec 12, 2021  1720 Covid/Flu swab is neg.  [CS]  1803 Repeat Trop remains normal. Will recheck CBG after IVF.  [CS]  1932 Glucose is improving. Ready for discharge. Considered admission but given improved CBG, no indication of ACS or other acute process he does not require admission at this time.  [CS]    Clinical Course User Index [CS] Truddie Hidden, MD     MDM Rules/Calculators/A&P Medical Decision Making Problems Addressed: Chest wall pain: acute illness or injury that poses a threat to life or bodily functions Hyperglycemia: acute illness or injury that poses a threat to life or bodily functions  Amount and/or Complexity of Data Reviewed External Data Reviewed: labs, ECG and notes. Labs: ordered. Radiology: ordered and independent interpretation performed. Decision-making details documented in ED Course. ECG/medicine tests: ordered and independent interpretation performed. Decision-making details documented in ED Course.  Risk Prescription drug management. Decision regarding hospitalization.    Final Clinical Impression(s) / ED Diagnoses Final diagnoses:  Chest wall pain  Hyperglycemia    Rx / DC Orders ED Discharge Orders     None        Truddie Hidden, MD 12/12/21 1950

## 2021-12-12 NOTE — ED Triage Notes (Addendum)
Patient c/o chest pain and intermittent N/v x 1 week. Patient denies any SOB.  [id number]-[id number].

## 2021-12-16 ENCOUNTER — Other Ambulatory Visit: Payer: Self-pay

## 2021-12-16 ENCOUNTER — Encounter (HOSPITAL_BASED_OUTPATIENT_CLINIC_OR_DEPARTMENT_OTHER): Payer: Medicare Other | Admitting: Internal Medicine

## 2021-12-16 DIAGNOSIS — L97522 Non-pressure chronic ulcer of other part of left foot with fat layer exposed: Secondary | ICD-10-CM

## 2021-12-16 DIAGNOSIS — E11621 Type 2 diabetes mellitus with foot ulcer: Secondary | ICD-10-CM | POA: Diagnosis not present

## 2021-12-16 NOTE — Progress Notes (Signed)
GREYSYN, VANDERBERG ([id number]) Visit Report for 12/16/2021 Arrival Information Details Patient Name: Date of Service: [name] [name] 12/16/2021 12:45 PM Medical Record Number: [id number] Patient Account Number: [id number] Date of Birth/Sex: Treating RN: 1971/02/24 (51 y.o. [name] Primary Care Lilburn Straw: [name] Other Clinician: Referring Rashana Andrew: Treating Damyra Luscher/Extender: Delano Metz in Treatment: 11 Visit Information History Since Last Visit Added or deleted any medications: No Patient Arrived: Ambulatory Any new allergies or adverse reactions: No Arrival Time: 12:35 Had a fall or experienced change in No Accompanied By: self activities of daily living that may affect Transfer Assistance: None risk of falls: Patient Requires Transmission-Based Precautions: No Signs or symptoms of abuse/neglect since last visito No Patient Has Alerts: No Hospitalized since last visit: No Implantable device outside of the clinic excluding No cellular tissue based products placed in the center since last visit: Has Dressing in Place as Prescribed: Yes Pain Present Now: Yes Electronic Signature(s) Signed: 12/16/2021 5:45:59 PM By: Dellie Catholic RN Entered By: Dellie Catholic on 12/16/2021 12:36:45 -------------------------------------------------------------------------------- Encounter Discharge Information Details Patient Name: Date of Service: [name] [name] ND, [name] 12/16/2021 12:45 PM Medical Record Number: [id number] Patient Account Number: [id number] Date of Birth/Sex: Treating RN: [birth year] (51 y.o. [name] Primary Care Sivan Quast: [name] Other Clinician: Referring Francoise Chojnowski: Treating Michiko Lineman/Extender: Delano Metz in Treatment: 60 Encounter Discharge Information Items Post Procedure Vitals Discharge Condition: Stable Temperature (F): 97.6 Ambulatory Status: Ambulatory Pulse (bpm):  88 Discharge Destination: Home Respiratory Rate (breaths/min): 18 Transportation: Private Auto Blood Pressure (mmHg): 148/85 Accompanied By: self Schedule Follow-up Appointment: Yes Clinical Summary of Care: Patient Declined Electronic Signature(s) Signed: 12/16/2021 5:45:59 PM By: Dellie Catholic RN Entered By: Dellie Catholic on 12/16/2021 13:34:45 -------------------------------------------------------------------------------- Lower Extremity Assessment Details Patient Name: Date of Service: [name] [name] [name] 12/16/2021 12:45 PM Medical Record Number: [id number] Patient Account Number: [id number] Date of Birth/Sex: Treating RN: [birth year] (51 y.o. [name] Primary Care Dustyn Dansereau: [name] Other Clinician: Referring Tabetha Haraway: Treating Shareeka Yim/Extender: Delano Metz in Treatment: 69 Edema Assessment Assessed: [name]: No] [Right: No] Edema: [Left: Ye] [Right: s] Calf Left: Right: Point of Measurement: 50 cm From Medial Instep 41.5 cm Ankle Left: Right: Point of Measurement: 11 cm From Medial Instep 26.5 cm Vascular Assessment Pulses: Dorsalis Pedis Palpable: [Left:Yes] Electronic Signature(s) Signed: 12/16/2021 5:45:59 PM By: Dellie Catholic RN Entered By: Dellie Catholic on 12/16/2021 12:43:10 -------------------------------------------------------------------------------- Multi Wound Chart Details Patient Name: Date of Service: [name] [name] ND, [name] Brandy [name] 12/16/2021 12:45 PM Medical Record Number: [id number] Patient Account Number: [id number] Date of Birth/Sex: Treating RN: [birth year] (51 y.o. [name] Primary Care Khalessi Blough: [name] Other Clinician: Referring Idali Lafever: Treating Quetzally Callas/Extender: Delano Metz in Treatment: 85 Vital Signs Height(in): 77 Pulse(bpm): 88 Weight(lbs): 232 Blood Pressure(mmHg): 148/85 Body Mass Index(BMI): 27.5 Temperature(F): 97.6 Respiratory  Rate(breaths/min): 18 Photos: [2R:Left Metatarsal head first] [N/A:N/A N/A] Wound Location: [2R:Blister] [N/A:N/A] Wounding Event: [2R:Diabetic Wound/Ulcer of the Lower] [N/A:N/A] Primary Etiology: [2R:Extremity Anemia, Hypertension, Peripheral] [N/A:N/A] Comorbid History: [2R:Venous Disease, Type II Diabetes 06/23/2020] [N/A:N/A] Date Acquired: [2R:69] [N/A:N/A] Weeks of Treatment: [2R:Open] [N/A:N/A] Wound Status: [2R:Yes] [N/A:N/A] Wound Recurrence: [2R:0.6x0.3x0.5] [N/A:N/A] Measurements L x W x D (cm) [2R:0.141] [N/A:N/A] A (cm) : rea [2R:0.071] [N/A:N/A] Volume (cm) : [2R:-354.80%] [N/A:N/A] % Reduction in A [2R:rea: -2266.70%] [N/A:N/A] % Reduction in Volume: [2R:7] Starting Position 1 (o'clock): [2R:12] Ending Position 1 (o'clock): [2R:0.5] Maximum Distance 1 (cm): [2R:Yes] [N/A:N/A] Undermining: [2R:Grade 2] [N/A:N/A]  Classification: [2R:Medium] [N/A:N/A] Exudate A mount: [2R:Serosanguineous] [N/A:N/A] Exudate Type: [2R:red, brown] [N/A:N/A] Exudate Color: [2R:Thickened] [N/A:N/A] Wound Margin: [2R:Large (67-100%)] [N/A:N/A] Granulation A mount: [2R:Red] [N/A:N/A] Granulation Quality: [2R:None Present (0%)] [N/A:N/A] Necrotic A mount: [2R:Fat Layer (Subcutaneous Tissue): Yes N/A] Exposed Structures: [2R:Fascia: No Tendon: No Muscle: No Joint: No Bone: No Medium (34-66%)] [N/A:N/A] Epithelialization: [2R:Debridement - Excisional] [N/A:N/A] Debridement: Pre-procedure Verification/Time Out 12:50 [N/A:N/A] Taken: [2R:Other] [N/A:N/A] Pain Control: [2R:Callus, Subcutaneous, Slough] [N/A:N/A] Tissue Debrided: [2R:Skin/Subcutaneous Tissue] [N/A:N/A] Level: [2R:0.18] [N/A:N/A] Debridement A (sq cm): [2R:rea Curette] [N/A:N/A] Instrument: [2R:Minimum] [N/A:N/A] Bleeding: [2R:Pressure] [N/A:N/A] Hemostasis A chieved: [2R:4] [N/A:N/A] Procedural Pain: [2R:4] [N/A:N/A] Post Procedural Pain: [2R:Procedure was tolerated well] [N/A:N/A] Debridement Treatment Response:  [2R:0.6x0.3x0.5] [N/A:N/A] Post Debridement Measurements L x W x D (cm) [2R:0.071] [N/A:N/A] Post Debridement Volume: (cm) [2R:Debridement] [N/A:N/A] Treatment Notes Electronic Signature(s) Signed: 12/16/2021 2:19:38 PM By: Kalman Shan DO Signed: 12/16/2021 5:37:21 PM By: Deon Pilling RN, BSN Entered By: Kalman Shan on 12/16/2021 13:20:04 -------------------------------------------------------------------------------- Multi-Disciplinary Care Plan Details Patient Name: Date of Service: [name] [name] ND, [name] 12/16/2021 12:45 PM Medical Record Number: [id number] Patient Account Number: [id number] Date of Birth/Sex: Treating RN: June 15, 1971 (51 y.o. [name] Primary Care : [name] Other Clinician: Referring : Treating /Extender: Delano Metz in Treatment: [address] [place] reviewed with physician Active Inactive Abuse / Safety / Falls / Self Care Management Nursing Diagnoses: History of Falls Goals: Patient/caregiver will verbalize/demonstrate measures taken to prevent injury and/or falls Date Initiated: 11/04/2021 Target Resolution Date: 01/06/2022 Goal Status: Active Interventions: Assess fall risk on admission and as needed Assess impairment of mobility on admission and as needed per policy Notes: Wound/Skin Impairment Nursing Diagnoses: Impaired tissue integrity Goals: Patient/caregiver will verbalize understanding of skin care regimen Date Initiated: 11/19/2020 Target Resolution Date: 01/09/2022 Goal Status: Active Ulcer/skin breakdown will have a volume reduction of 30% by week 4 Date Initiated: 08/16/2020 Date Inactivated: 03/03/2021 Target Resolution Date: 03/10/2021 Goal Status: Met Interventions: Provide education on ulcer and skin care Notes: 07/24/21: Wound care regimen ongoing. Electronic Signature(s) Signed: 12/16/2021 5:45:59 PM By: Dellie Catholic RN Entered By:  Dellie Catholic on 12/16/2021 12:50:56 -------------------------------------------------------------------------------- Pain Assessment Details Patient Name: Date of Service: [name] [name] [name] 12/16/2021 12:45 PM Medical Record Number: [id number] Patient Account Number: [id number] Date of Birth/Sex: Treating RN: January 22, 1971 (51 y.o. [name] Primary Care : [name] Other Clinician: Referring : Treating /Extender: Delano Metz in Treatment: 15 Active Problems Location of Pain Severity and Description of Pain Patient Has Paino Yes Site Locations Pain Location: Pain Location: Pain in Ulcers With Dressing Change: No Duration of the Pain. Constant / Intermittento Constant Rate the pain. Current Pain Level: 7 Worst Pain Level: 10 Least Pain Level: 7 Tolerable Pain Level: 7 Character of Pain Describe the Pain: Throbbing Pain Management and Medication Current Pain Management: Medication: No Cold Application: No Rest: Yes Massage: No Activity: No T.E.N.S.: No Heat Application: No Leg drop or elevation: No Is the Current Pain Management Adequate: Adequate How does your wound impact your activities of daily livingo Sleep: No Bathing: No Appetite: No Relationship With Others: No Bladder Continence: No Emotions: No Bowel Continence: No Work: No Toileting: No Drive: No Dressing: No Hobbies: No Electronic Signature(s) Signed: 12/16/2021 5:45:59 PM By: Dellie Catholic RN Entered By: Dellie Catholic on 12/16/2021 12:38:56 -------------------------------------------------------------------------------- Patient/Caregiver Education Details Patient Name: Date of Service: [name] [name] 1/24/2023andnbsp12:45 PM Medical Record Number: [id number] Patient Account Number: [id number] Date  of Birth/Gender: Treating RN: [birth year] (51 y.o. [name] Primary Care Physician: [name] Other  Clinician: Referring Physician: Treating Physician/Extender: Delano Metz in Treatment: 72 Education Assessment Education Provided To: Patient Education Topics Provided Wound/Skin Impairment: Methods: Explain/Verbal Responses: Return demonstration correctly Electronic Signature(s) Signed: 12/16/2021 5:45:59 PM By: Dellie Catholic RN Entered By: Dellie Catholic on 12/16/2021 12:51:18 -------------------------------------------------------------------------------- Wound Assessment Details Patient Name: Date of Service: [name] [name] [name] 12/16/2021 12:45 PM Medical Record Number: [id number] Patient Account Number: [id number] Date of Birth/Sex: Treating RN: 1971/10/31 (51 y.o. [name] Primary Care : [name] Other Clinician: Referring : Treating /Extender: Delano Metz in Treatment: 69 Wound Status Wound Number: 2R Primary Diabetic Wound/Ulcer of the Lower Extremity Etiology: Wound Location: Left Metatarsal head first Wound Status: Open Wounding Event: Blister Comorbid Anemia, Hypertension, Peripheral Venous Disease, Type II Date Acquired: 06/23/2020 History: Diabetes Weeks Of Treatment: 69 Clustered Wound: No Photos Wound Measurements Length: (cm) 0.6 Width: (cm) 0.3 Depth: (cm) 0.5 Area: (cm) 0.141 Volume: (cm) 0.071 % Reduction in Area: -354.8% % Reduction in Volume: -2266.7% Epithelialization: Medium (34-66%) Undermining: Yes Starting Position (o'clock): 7 Ending Position (o'clock): 12 Maximum Distance: (cm) 0.5 Wound Description Classification: Grade 2 Wound Margin: Thickened Exudate Amount: Medium Exudate Type: Serosanguineous Exudate Color: red, brown Foul Odor After Cleansing: No Slough/Fibrino No Wound Bed Granulation Amount: Large (67-100%) Exposed Structure Granulation Quality: Red Fascia Exposed: No Necrotic Amount: None Present (0%) Fat Layer  (Subcutaneous Tissue) Exposed: Yes Tendon Exposed: No Muscle Exposed: No Joint Exposed: No Bone Exposed: No Treatment Notes Wound #2R (Metatarsal head first) Wound Laterality: Left Cleanser Soap and Water Discharge Instruction: May shower and wash wound with dial antibacterial soap and water prior to dressing change. Wound Cleanser Discharge Instruction: Cleanse the wound with wound cleanser prior to applying a clean dressing using gauze sponges, not tissue or cotton balls. Peri-Wound Care Sween Lotion (Moisturizing lotion) Discharge Instruction: Apply moisturizing lotion to leg Topical Primary Dressing Affinity Secondary Dressing Woven Gauze Sponges 2x2 in Discharge Instruction: Apply over primary dressing as directed. Optifoam Non-Adhesive Dressing, 4x4 in Discharge Instruction: cut to make foam donut to help offload ADAPTIC TOUCH 3x4.25 in Discharge Instruction: Apply over primary dressing as directed. Secured With 97M [place] [employer] T ape, 2x2 (in/yd) Discharge Instruction: Secure dressing with tape as directed. Compression Wrap Compression Stockings Add-Ons Electronic Signature(s) Signed: 12/16/2021 5:45:59 PM By: Dellie Catholic RN Entered By: Dellie Catholic on 12/16/2021 12:45:50 -------------------------------------------------------------------------------- Vitals Details Patient Name: Date of Service: [name] [name] ND, [name] 12/16/2021 12:45 PM Medical Record Number: [id number] Patient Account Number: [id number] Date of Birth/Sex: Treating RN: 1971-07-01 (51 y.o. [name] Primary Care : [name] Other Clinician: Referring : Treating /Extender: Delano Metz in Treatment: 23 Vital Signs Time Taken: 12:36 Temperature (F): 97.6 Height (in): 77 Pulse (bpm): 88 Weight (lbs): 232 Respiratory Rate (breaths/min): 18 Body Mass Index (BMI): 27.5 Blood Pressure (mmHg):  148/85 Reference Range: 80 - 120 mg / dl Electronic Signature(s) Signed: 12/16/2021 5:45:59 PM By: Dellie Catholic RN Entered By: Dellie Catholic on 12/16/2021 12:37:16

## 2021-12-16 NOTE — Progress Notes (Signed)
[name] [name] ([id number]) Visit Report for 12/16/2021 Chief Complaint Document Details Patient Name: Date of Service: [name] [name] 12/16/2021 12:45 PM Medical Record Number: [id number] Patient Account Number: [id number] Date of Birth/Sex: Treating RN: 1971-10-27 (51 y.o. [name] [name] Primary Care Provider: [name] Other Clinician: Referring Provider: Treating Provider/Extender: Delano Metz in Treatment: 38 Information Obtained from: Patient Chief Complaint patient is here for a review of the wound on his plantar left first metatarsal head and back of his right leg Electronic Signature(s) Signed: 12/16/2021 2:19:38 PM By: Kalman Shan DO Entered By: Kalman Shan on 12/16/2021 13:20:21 -------------------------------------------------------------------------------- Cellular or Tissue Based Product Details Patient Name: Date of Service: [place] ND, [name] 12/16/2021 12:45 PM Medical Record Number: [id number] Patient Account Number: [id number] Date of Birth/Sex: Treating RN: [birth year] (51 y.o. [name] [name] Primary Care Provider: [name] Other Clinician: Referring Provider: Treating Provider/Extender: Delano Metz in Treatment: 2 Cellular or Tissue Based Product Type Wound #2R Left Metatarsal head first Applied to: Performed By: Physician Kalman Shan, DO Cellular or Tissue Based Product Type: Affinity Level of Consciousness (Pre-procedure): Awake and Alert Pre-procedure Verification/Time Out Yes - 13:25 Taken: Location: genitalia / hands / feet / multiple digits Wound Size (sq cm): 0.18 Product Size (sq cm): 3 Waste Size (sq cm): 0 Amount of Product Applied (sq cm): 3 Instrument Used: Forceps Lot #: [id number] Order #: [id number] Expiration Date: 12/22/2021 Fenestrated: No Reconstituted: No Secured: Yes Secured With: Steri-Strips Dressing Applied: Yes Primary Dressing:  adaptic, gauze Procedural Pain: 0 Post Procedural Pain: 0 Response to Treatment: Procedure was tolerated well Level of Consciousness (Post- Awake and Alert procedure): Post Procedure Diagnosis Same as Pre-procedure Electronic Signature(s) Signed: 12/16/2021 2:19:38 PM By: Kalman Shan DO Signed: 12/16/2021 5:45:59 PM By: Dellie Catholic RN Entered By: Dellie Catholic on 12/16/2021 13:26:22 -------------------------------------------------------------------------------- Debridement Details Patient Name: Date of Service: [name] [name] ND, [name] 12/16/2021 12:45 PM Medical Record Number: [id number] Patient Account Number: [id number] Date of Birth/Sex: Treating RN: 11-28-1970 (51 y.o. [name] [name] Primary Care Provider: [name] Other Clinician: Referring Provider: Treating Provider/Extender: Delano Metz in Treatment: 69 Debridement Performed for Assessment: Wound #2R Left Metatarsal head first Performed By: Physician Kalman Shan, DO Debridement Type: Debridement Severity of Tissue Pre Debridement: Fat layer exposed Level of Consciousness (Pre-procedure): Awake and Alert Pre-procedure Verification/Time Out Yes - 12:50 Taken: Start Time: 12:50 Pain Control: Other : Benzocaine 20% T Area Debrided (L x W): otal 0.6 (cm) x 0.3 (cm) = 0.18 (cm) Tissue and other material debrided: Viable, Non-Viable, Callus, Slough, Subcutaneous, Slough Level: Skin/Subcutaneous Tissue Debridement Description: Excisional Instrument: Curette Bleeding: Minimum Hemostasis Achieved: Pressure End Time: 12:54 Procedural Pain: 4 Post Procedural Pain: 4 Response to Treatment: Procedure was tolerated well Level of Consciousness (Post- Awake and Alert procedure): Post Debridement Measurements of Total Wound Length: (cm) 0.6 Width: (cm) 0.3 Depth: (cm) 0.5 Volume: (cm) 0.071 Character of Wound/Ulcer Post Debridement: Improved Severity of Tissue Post  Debridement: Fat layer exposed Post Procedure Diagnosis Same as Pre-procedure Electronic Signature(s) Signed: 12/16/2021 2:19:38 PM By: Kalman Shan DO Signed: 12/16/2021 5:45:59 PM By: Dellie Catholic RN Entered By: Dellie Catholic on 12/16/2021 12:55:02 -------------------------------------------------------------------------------- HPI Details Patient Name: Date of Service: [name] [name] ND, [name] 12/16/2021 12:45 PM Medical Record Number: [id number] Patient Account Number: [id number] Date of Birth/Sex: Treating RN: 1971/04/27 (51 y.o. [name] [name] Primary Care Provider: Other Clinician: [name] Referring Provider: Treating Provider/Extender: Kalman Shan  [name] in Treatment: 69 History of Present Illness HPI Description: 04/21/18 ADMISSION This is a 51 year old man who is a type II diabetic. In spite of fact his hemoglobin A1c is actually quite good 5.73 months ago he is at a really difficult time over the last 6-7 months. He developed a rapidly progressive infection in the right foot in November 2018 associated with osteomyelitis and necrotizing fasciitis. 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 The stump required and a revision on 11/24/17. The stump revision was advertised as being secondary to falls. I'm not sure the progressive history here however this area is actually closed over. The patient tells me over the same timeframe he has had wounds on the plantar aspect of his left foot. In the ED saw Dr. Sharol Given in April of this year. 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 It is really not clear that this patient is been dressing this with anything. He came in with the clinic without any specific dressing on the wound areas using his own tennis shoe. He tells me that he only ambulates of course to do a pivot transfer. He does not have his prosthesis for the right leg as of yet and he blames Medicaid for this.  He does however use the foot to push himself along in his wheelchair at home. The patient has not had formal arterial studies. ABI in our clinic on the left was 1.13. Patient's past medical history includes type 2 diabetes, fracture of the right fibula. I see that he was treated for abscesses on his right buttock and chest in 2016. I did not look at the microbiology of this. 04/28/18; patient comes back in the clinic today with the wound pretty much the same as when he came in here last week. Small opening lots of undermining relatively. He tells [place] that nothing is really been on this for 3 days in spite of the fact that we gave him enough to dress this easily especially such a small wound. He says he lives with his mother, she is not capable of assisting with this he is changing the dressings himself. 05/05/18; much better-looking wound today. Smaller. There is some undermining medially however that's only perhaps 2 mm. No surrounding erythema. We've been using silver collagen 05/12/18; small wound on the first metatarsal head. No undermining no surrounding erythema. We've been using silver collagen he has home health coming out to change 05/20/18 the wound on the first metatarsal head looks better. Covered in surface debris/callus nonviable tissue. Required debridement but post debridement this looks quite good. We've been using silver collagen. He has home health 05/27/18; first metatarsal head wound continues to improve. Just about completely closed. Still a lot of surface debridement callus. We've been using silver collagen 06/06/18; the first metatarsal wound is completely healed over. There is still a lot of callus. I gently removed some of this just to make sure that there was no open area and there is not. The patient mentions to me that his left leg has felt like "lead" for about a week READMISSION 08/16/2020 This is a 51 year old man with type 2 diabetes. He returns to clinic today with a 1  month history of a blister and callus over the first metatarsal head. This is in exactly the same area as when he was here in 2019. He has a right BKA and a prosthesis from a diabetic foot infection on the right  in 2018. He has standard running shoes on the left foot to match the area in his prosthesis. He has only been covering this area with a Band-Aid has not really been specifically dressing this. ABI in our clinic on the left was 1.16. This is essentially stable from the value in 2019 10/1 the patient has a linear wound over the left first metatarsal head. Again not a lot of callus thick skin around this that I removed with a #3 curette. This is not go deep to bone or muscle it does not look to be infected. 10/8 first metatarsal head. The wound looks like it is closing however this is going to be a difficult area to offload in the future. He has a size 15 shoe and he says his shoes are years old. The inserts in these current shoes are totally useless and I have advised him to replace these. He may need a new pair of shoes and I have he got original prosthesis at [facility] I have asked him to go back there. 11/9; the patient has not been here in more than a month. Not sure he was healed when he was here the last time. I told him he would have to get new shoes and certainly offload the area over the left first metatarsal head. He has done nothing of the kind. He arrives back in clinic with the same old new balance sneakers. A very large separating callus over the first metatarsal head on the left 11/16; he has purchased new shoes and has at least 2 insoles like I asked. The wound is measuring much smaller. He is using silver alginate he changes the dressing himself 11/30; wound is measurably smaller in length of about a half a centimeter. This is generally very little depth. We have been using silver alginate. He changes the dressing himself every second day. 12/14; wound is about the  same size. Significant undermining medially relative to the size of the wound. We have been using silver alginate. There is not a way to offload this area as he walks with a prosthesis on the right leg 12/28; wound is about the same size. Again he has undermining medially. I am using polymen on this 12/02/2020; the wound looks smaller. Still a senescent edge. We have been using polymen 1/24; the wound is come down a few millimeters. Still a senescent edge. I have been using polymen 2/8; the wound is come down slightly. Still with slight undermining from 12-6 o'clock I have been using polymen partially to get offloading on this area which is opposed by his prosthesis on the other leg. 2/22 quite open improvement he still has a comma shaped wound on the left first metatarsal head. Some depth. Using polymen 3/14; he still has the same problem of a comma shaped area over the left first metatarsal head. This seems to have had more depth today at 0.6 cm. We have been using polymen. The patient changes this himself every second day. I explored the idea of a total contact cast on the left leg however this is his driving foot. He was not enamored with the idea of not being able to drive and states that he does not have anybody else to get him around 3/28; again he comes in with a smaller looking wound however there is depth and undermining requiring debridement. We have been using Hydrofera Blue, changed to silver collagen today. The patient is doing the dressing himself 4/11; patient presents  today for follow-up of his left diabetic foot wound. He denies any issues in the past 2 weeks. He is currently using silver collagen every other day with dressing changes. He does not use diabetic shoes or special inserts to his sneakers. He does not use felt pads for offloading. 4/19; patient presents for his 1 week follow-up. He denies any issues. He reports using Hydrofera Blue and has not been using silver collagen  for dressing changes. He reports minimal drainage to the wound. He overall feels well. 5/3; patient presents for 2-week follow-up. He has been using silver collagen without issues. He has no complaints today and states he overall feels well. 5/17; patient presents for 2-week follow-up. He states that the sleeve is rubbing up against the back of his right leg and causing a wound. He states this happened a week ago and has not been dressing the wound. The plantar wound is stable and he has been using collagen every other day. 5/24; Patient was had a recent hospitalization for cellulitis of his right popliteal fossa. He was admitted and started on IV antibiotics. He does not recall the plantar wound dressed while inpatient. He feels a lot better today. He was discharged on 5/21 on oral antibiotics and has not picked this up until today. He reports using collagen to the plantar wound since discharge. He is keeping the right posterior knee wound covered. He is getting a new sleeve for his prostatic on 5/26. He currently denies signs of infection. 6/1; patient presents for 1 week follow-up. He is still on doxycycline. He has not been dressing the back of the right knee wound. He states he is receiving his new prosthetic sleeve tomorrow. He states he is using silver alginate to the plantar foot wound. He currently denies signs of infection. He states he has not been offloading the left foot wound 6/7; patient presents for 1 week follow-up. He completes his last dose of doxycycline today. He finally received his prosthetic sleeve for the right side and he states it feels much better. He has been using antibiotic ointment to the back of that right knee wound. He has been using silver alginate to the plantar foot wound. He currently denies signs of infection. 6/21; patient presents for 2-week follow-up. He reports no issues with his prosthetic sleeve. He has been using antibiotic ointment to the back of the  right knee. He has been using silver alginate to the plantar foot wound. He has no issues or complaints today. He denies signs of infection. 7/5; patient presents for 2-week follow-up. He reports no issues or complaints today. He continues to use antibiotic ointment to the right posterior knee wound and silver alginate to the left plantar foot wound. He denies signs of infection. 7/19; patient presents for 2-week follow-up. He continues to use antibiotic ointment to the right posterior knee and silver alginate to the left plantar foot wound. He has no issues or complaints today. He denies signs of infection. 8/4; patient presents for 2-week follow-up. He has no issues or complaints today. He denies signs of infection. 8/18; patient presents for 2-week follow-up. He has no issues or concerns today. He has been approved for Apligraf and he would like to have this placed today. He denies signs of infection and reports that his right posterior knee wound is closed. 8/25; patient presents for 1 week follow-up. He had no issues with his first Apligraf placement last week. He did however develop an abscess to the back of  his right knee. He reports tenderness to this area. He denies systemic signs of infection. 9/1; patient presents for 1 week follow-up. He has had no issues with his plantar foot wound. The Apligraf has stayed in place over the past week. A wound culture was done at last clinic visit and a new antibiotic was sent to the pharmacy. Patient has not received this yet. He reports some mild tenderness to the back of his right knee. He denies systemic signs of infection. 9/8; patient presents for 1 week follow-up. He states he took Keflex and had diarrhea as a side effect. He was able to complete the course. He reports continued tenderness to the right posterior knee wound. He has some drainage still. He denies increased warmth or erythema to the area. He denies fever/chills or nausea/vomiting. He  has no issues or complaints today about his plantar foot wound. 9/15; patient presents for follow-up. He reports improvement in pain to the back of his right leg. He does not report any drainage. He had an ultrasound completed on 9/9 that showed a superficial fluid collection concerning for abscess. We attempted to call patient with results with no answer. We did put an urgent referral to general surgery and he reports he has not received a call. He currently denies systemic signs of infection. He reports no issues to his left plantar foot wound. 9/27; patient presents for follow-up. He has no issues or complaints today. He has had no issues with the previous wound to the back of his right leg. He has not made an appointment to see general surgery. He had Apligraf placed at last clinic visit to the plantar foot wound. He currently denies signs of infection. 10/7; patient presents for follow-up. Upon entering the room there is vomit throughout the entire floor. Patient states he does not feel well. I recommended he go to urgent care. I recommended following up next week for wound care. 10/10; patient presents for follow-up. He came last week for his follow-up however he was ill and started vomiting in the exam room. Today he states he feels better however he continues to have vomiting episodes. He states he attempted to go to urgent care/the ED but could not find a parking spot. He subsequently went home. He has no issues or complaints today regarding the foot wound. He never followed up with general surgery for the abscess identified on ultrasound to the back of his right knee. 10/20; patient presents for follow-up. He has no issues or complaints today. Overall he feels well today. 10/27; patient presents for follow-up. He has no issues or complaints today. 11/3; patient presents for follow-up. He has no issues or complaints today. He states he does not want to follow-up with orthopedics to reevaluate  the abscess in the back of his right leg. He denies signs of infection. 11/15; patient presents for follow-up. He has no issues or complaints today. He had been using silver alginate to the left first met head wound. He denies signs of infection. 11/22; patient presents for follow-up. He had Affinity placed at last clinic visit. He has no issues or complaints today. 11/29; patient presents for follow-up. He is in good spirits today. He has no issues or complaints today. 12/6; patient presents for follow-up. He has no issues or complaints today. 12/13; patient presents for follow-up. He denies signs of infection. He has no issues or complaints today. 12/20; patient presents for follow-up. He has no issues or complaints today. He denies signs  of infection. 12/29; patient presents for follow-up. He states that a few days ago he has had reaccumulation of an abscess behind the right knee. He denies increased warmth or erythema to the area. He denies fever/chills or drainage. 1/6; patient presents for follow-up. He states he went to the [facility] emergency department to be evaluated for the abscess to his right posterior knee. He was started on clindamycin. They were unable to drain the abscess. Patient states that eventually the abscess ruptured. He reports improvement in his symptoms. He has no issues or complaints about his left foot wound. 1/17; patient presents for follow-up. He has no issues or complaints today. 1/24; patient presents for follow-up. He has no issues or complaints today. He denies signs of infection. Electronic Signature(s) Signed: 12/16/2021 2:19:38 PM By: Kalman Shan DO Entered By: Kalman Shan on 12/16/2021 13:20:41 -------------------------------------------------------------------------------- Physical Exam Details Patient Name: Date of Service: [name], [name] 12/16/2021 12:45 PM Medical Record Number: [id number] Patient Account Number: [id number] Date of  Birth/Sex: Treating RN: 12/09/1970 (51 y.o. [name] [name] Primary Care Provider: [name] Other Clinician: Referring Provider: Treating Provider/Extender: Delano Metz in Treatment: 105 Constitutional respirations regular, non-labored and within target range for patient.. Cardiovascular 2+ dorsalis pedis/posterior tibialis pulses. Psychiatric pleasant and cooperative. Notes Left leg: T the first met head there is an open wound with granulation tissue, circumferential callus and nonviable tissue. No signs of infection o Electronic Signature(s) Signed: 12/16/2021 2:19:38 PM By: Kalman Shan DO Entered By: Kalman Shan on 12/16/2021 13:21:22 -------------------------------------------------------------------------------- Physician Orders Details Patient Name: Date of Service: [name], [name] 12/16/2021 12:45 PM Medical Record Number: [id number] Patient Account Number: [id number] Date of Birth/Sex: Treating RN: 1971/05/21 (51 y.o. [name] [name] Primary Care Provider: [name] Other Clinician: Referring Provider: Treating Provider/Extender: Delano Metz in Treatment: 37 Verbal / Phone Orders: No Diagnosis Coding ICD-10 Coding Code Description [phone] Non-pressure chronic ulcer of other part of left foot with fat layer exposed E11.621 Type 2 diabetes mellitus with foot ulcer E11.42 Type 2 diabetes mellitus with diabetic polyneuropathy Z89.511 Acquired absence of right leg below knee Follow-up Appointments ppointment in 1 week. - with Dr. Heber Martin Return A Cellular or Tissue Based Products Cellular or Tissue Based Product Type: - Affinity #10 Cellular or Tissue Based Product applied to wound bed, secured with steri-strips, cover with A daptic or Mepitel. (DO NOT REMOVE). Other Cellular or Tissue Based Products Orders/Instructions: - Run IVR for additional affinity Edema Control - Lymphedema / SCD  / Other Elevate legs to the level of the heart or above for 30 minutes daily and/or when sitting, a frequency of: - throughout the day Avoid standing for long periods of time. Off-Loading Other: - minimize walking on left foot related to pressure to wound. Aid in wound healing. Wound Treatment Wound #2R - Metatarsal head first Wound Laterality: Left Cleanser: Soap and Water 1 x Per Week/15 Days Discharge Instructions: May shower and wash wound with dial antibacterial soap and water prior to dressing change. Cleanser: Wound Cleanser (Generic) 1 x Per Week/15 Days Discharge Instructions: Cleanse the wound with wound cleanser prior to applying a clean dressing using gauze sponges, not tissue or cotton balls. Peri-Wound Care: Sween Lotion (Moisturizing lotion) 1 x Per Week/15 Days Discharge Instructions: Apply moisturizing lotion to leg Prim Dressing: Affinity ary 1 x Per Week/15 Days Secondary Dressing: Woven Gauze Sponges 2x2 in (Generic) 1 x Per Week/15 Days Discharge Instructions: Apply over primary dressing  as directed. Secondary Dressing: Optifoam Non-Adhesive Dressing, 4x4 in (Generic) 1 x Per Week/15 Days Discharge Instructions: cut to make foam donut to help offload Secondary Dressing: ADAPTIC TOUCH 3x4.25 in 1 x Per Week/15 Days Discharge Instructions: Apply over primary dressing as directed. Secured With: 25M Medipore H Soft Cloth Surgical Tape, 2x2 (in/yd) (Generic) 1 x Per Week/15 Days Discharge Instructions: Secure dressing with tape as directed. Electronic Signature(s) Signed: 12/16/2021 2:19:38 PM By: Kalman Shan DO Signed: 12/16/2021 5:45:59 PM By: Dellie Catholic RN Entered By: Dellie Catholic on 12/16/2021 13:31:56 -------------------------------------------------------------------------------- Problem List Details Patient Name: Date of Service: [name] [name] ND, [name] 12/16/2021 12:45 PM Medical Record Number: [id number] Patient Account Number: [id number] Date of  Birth/Sex: Treating RN: 04-17-1971 (51 y.o. [name] [name] Primary Care Provider: [name] Other Clinician: Referring Provider: Treating Provider/Extender: Delano Metz in Treatment: 75 Active Problems ICD-10 Encounter Code Description Active Date MDM Diagnosis L97.522 Non-pressure chronic ulcer of other part of left foot with fat layer exposed 08/16/2020 No Yes E11.621 Type 2 diabetes mellitus with foot ulcer 08/16/2020 No Yes E11.42 Type 2 diabetes mellitus with diabetic polyneuropathy 08/16/2020 No Yes Z89.511 Acquired absence of right leg below knee 08/16/2020 No Yes Inactive Problems Resolved Problems ICD-10 Code Description Active Date Resolved Date S81.801D Unspecified open wound, right lower leg, subsequent encounter 04/08/2021 04/08/2021 Electronic Signature(s) Signed: 12/16/2021 2:19:38 PM By: Kalman Shan DO Entered By: Kalman Shan on 12/16/2021 13:19:57 -------------------------------------------------------------------------------- Progress Note Details Patient Name: Date of Service: [place] ND, [name] 12/16/2021 12:45 PM Medical Record Number: [id number] Patient Account Number: [id number] Date of Birth/Sex: Treating RN: 1971-06-28 (51 y.o. [name] [name] Primary Care Provider: [name] Other Clinician: Referring Provider: Treating Provider/Extender: Delano Metz in Treatment: 60 Subjective Chief Complaint Information obtained from Patient patient is here for a review of the wound on his plantar left first metatarsal head and back of his right leg History of Present Illness (HPI) 04/21/18 ADMISSION This is a 51 year old man who is a type II diabetic. In spite of fact his hemoglobin A1c is actually quite good 5.73 months ago he is at a really difficult time over the last 6-7 months. He developed a rapidly progressive infection in the right foot in November 2018 associated with  osteomyelitis and necrotizing fasciitis. 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 The stump required and a revision on 11/24/17. The stump revision was advertised as being secondary to falls. I'm not sure the progressive history here however this area is actually closed over. The patient tells me over the same timeframe he has had wounds on the plantar aspect of his left foot. In the ED saw Dr. Sharol Given in April of this year. 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 It is really not clear that this patient is been dressing this with anything. He came in with the clinic without any specific dressing on the wound areas using his own tennis shoe. He tells me that he only ambulates of course to do a pivot transfer. He does not have his prosthesis for the right leg as of yet and he blames Medicaid for this. He does however use the foot to push himself along in his wheelchair at home. The patient has not had formal arterial studies. ABI in our clinic on the left was 1.13. Patient's past medical history includes type 2 diabetes, fracture of the right fibula. I see that he was  treated for abscesses on his right buttock and chest in 2016. I did not look at the microbiology of this. 04/28/18; patient comes back in the clinic today with the wound pretty much the same as when he came in here last week. Small opening lots of undermining relatively. He tells [place] that nothing is really been on this for 3 days in spite of the fact that we gave him enough to dress this easily especially such a small wound. He says he lives with his mother, she is not capable of assisting with this he is changing the dressings himself. 05/05/18; much better-looking wound today. Smaller. There is some undermining medially however that's only perhaps 2 mm. No surrounding erythema. We've been using silver collagen 05/12/18; small wound on the first metatarsal head. No undermining no surrounding  erythema. We've been using silver collagen he has home health coming out to change 05/20/18 the wound on the first metatarsal head looks better. Covered in surface debris/callus nonviable tissue. Required debridement but post debridement this looks quite good. We've been using silver collagen. He has home health 05/27/18; first metatarsal head wound continues to improve. Just about completely closed. Still a lot of surface debridement callus. We've been using silver collagen 06/06/18; the first metatarsal wound is completely healed over. There is still a lot of callus. I gently removed some of this just to make sure that there was no open area and there is not. The patient mentions to me that his left leg has felt like "lead" for about a week READMISSION 08/16/2020 This is a 51 year old man with type 2 diabetes. He returns to clinic today with a 1 month history of a blister and callus over the first metatarsal head. This is in exactly the same area as when he was here in 2019. 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 He has standard running shoes on the left foot to match the area in his prosthesis. He has only been covering this area with a Band-Aid has not really been specifically dressing this. ABI in our clinic on the left was 1.16. This is essentially stable from the value in 2019 10/1 the patient has a linear wound over the left first metatarsal head. Again not a lot of callus thick skin around this that I removed with a #3 curette. This is not go deep to bone or muscle it does not look to be infected. 10/8 first metatarsal head. The wound looks like it is closing however this is going to be a difficult area to offload in the future. He has a size 15 shoe and he says his shoes are years old. The inserts in these current shoes are totally useless and I have advised him to replace these. He may need a new pair of shoes and I have he got original  prosthesis at [facility] on the right I have asked him to go back there. 11/9; the patient has not been here in more than a month. Not sure he was healed when he was here the last time. I told him he would have to get new shoes and certainly offload the area over the left first metatarsal head. He has done nothing of the kind. He arrives back in clinic with the same old new balance sneakers. A very large separating callus over the first metatarsal head on the left 11/16; he has purchased new shoes and has at least 2 insoles like  I asked. The wound is measuring much smaller. He is using silver alginate he changes the dressing himself 11/30; wound is measurably smaller in length of about a half a centimeter. This is generally very little depth. We have been using silver alginate. He changes the dressing himself every second day. 12/14; wound is about the same size. Significant undermining medially relative to the size of the wound. We have been using silver alginate. There is not a way to offload this area as he walks with a prosthesis on the right leg 12/28; wound is about the same size. Again he has undermining medially. I am using polymen on this 12/02/2020; the wound looks smaller. Still a senescent edge. We have been using polymen 1/24; the wound is come down a few millimeters. Still a senescent edge. I have been using polymen 2/8; the wound is come down slightly. Still with slight undermining from 12-6 o'clock I have been using polymen partially to get offloading on this area which is opposed by his prosthesis on the other leg. 2/22 quite open improvement he still has a comma shaped wound on the left first metatarsal head. Some depth. Using polymen 3/14; he still has the same problem of a comma shaped area over the left first metatarsal head. This seems to have had more depth today at 0.6 cm. We have been using polymen. The patient changes this himself every second day. I explored the idea of a  total contact cast on the left leg however this is his driving foot. He was not enamored with the idea of not being able to drive and states that he does not have anybody else to get him around 3/28; again he comes in with a smaller looking wound however there is depth and undermining requiring debridement. We have been using Hydrofera Blue, changed to silver collagen today. The patient is doing the dressing himself 4/11; patient presents today for follow-up of his left diabetic foot wound. He denies any issues in the past 2 weeks. He is currently using silver collagen every other day with dressing changes. He does not use diabetic shoes or special inserts to his sneakers. He does not use felt pads for offloading. 4/19; patient presents for his 1 week follow-up. He denies any issues. He reports using Hydrofera Blue and has not been using silver collagen for dressing changes. He reports minimal drainage to the wound. He overall feels well. 5/3; patient presents for 2-week follow-up. He has been using silver collagen without issues. He has no complaints today and states he overall feels well. 5/17; patient presents for 2-week follow-up. He states that the sleeve is rubbing up against the back of his right leg and causing a wound. He states this happened a week ago and has not been dressing the wound. The plantar wound is stable and he has been using collagen every other day. 5/24; Patient was had a recent hospitalization for cellulitis of his right popliteal fossa. He was admitted and started on IV antibiotics. He does not recall the plantar wound dressed while inpatient. He feels a lot better today. He was discharged on 5/21 on oral antibiotics and has not picked this up until today. He reports using collagen to the plantar wound since discharge. He is keeping the right posterior knee wound covered. He is getting a new sleeve for his prostatic on 5/26. He currently denies signs of infection. 6/1;  patient presents for 1 week follow-up. He is still on doxycycline. He has not been  dressing the back of the right knee wound. He states he is receiving his new prosthetic sleeve tomorrow. He states he is using silver alginate to the plantar foot wound. He currently denies signs of infection. He states he has not been offloading the left foot wound 6/7; patient presents for 1 week follow-up. He completes his last dose of doxycycline today. He finally received his prosthetic sleeve for the right side and he states it feels much better. He has been using antibiotic ointment to the back of that right knee wound. He has been using silver alginate to the plantar foot wound. He currently denies signs of infection. 6/21; patient presents for 2-week follow-up. He reports no issues with his prosthetic sleeve. He has been using antibiotic ointment to the back of the right knee. He has been using silver alginate to the plantar foot wound. He has no issues or complaints today. He denies signs of infection. 7/5; patient presents for 2-week follow-up. He reports no issues or complaints today. He continues to use antibiotic ointment to the right posterior knee wound and silver alginate to the left plantar foot wound. He denies signs of infection. 7/19; patient presents for 2-week follow-up. He continues to use antibiotic ointment to the right posterior knee and silver alginate to the left plantar foot wound. He has no issues or complaints today. He denies signs of infection. 8/4; patient presents for 2-week follow-up. He has no issues or complaints today. He denies signs of infection. 8/18; patient presents for 2-week follow-up. He has no issues or concerns today. He has been approved for Apligraf and he would like to have this placed today. He denies signs of infection and reports that his right posterior knee wound is closed. 8/25; patient presents for 1 week follow-up. He had no issues with his first Apligraf  placement last week. He did however develop an abscess to the back of his right knee. He reports tenderness to this area. He denies systemic signs of infection. 9/1; patient presents for 1 week follow-up. He has had no issues with his plantar foot wound. The Apligraf has stayed in place over the past week. A wound culture was done at last clinic visit and a new antibiotic was sent to the pharmacy. Patient has not received this yet. He reports some mild tenderness to the back of his right knee. He denies systemic signs of infection. 9/8; patient presents for 1 week follow-up. He states he took Keflex and had diarrhea as a side effect. He was able to complete the course. He reports continued tenderness to the right posterior knee wound. He has some drainage still. He denies increased warmth or erythema to the area. He denies fever/chills or nausea/vomiting. He has no issues or complaints today about his plantar foot wound. 9/15; patient presents for follow-up. He reports improvement in pain to the back of his right leg. He does not report any drainage. He had an ultrasound completed on 9/9 that showed a superficial fluid collection concerning for abscess. We attempted to call patient with results with no answer. We did put an urgent referral to general surgery and he reports he has not received a call. He currently denies systemic signs of infection. He reports no issues to his left plantar foot wound. 9/27; patient presents for follow-up. He has no issues or complaints today. He has had no issues with the previous wound to the back of his right leg. He has not made an appointment to see general surgery.  He had Apligraf placed at last clinic visit to the plantar foot wound. He currently denies signs of infection. 10/7; patient presents for follow-up. Upon entering the room there is vomit throughout the entire floor. Patient states he does not feel well. I recommended he go to urgent care. I recommended  following up next week for wound care. 10/10; patient presents for follow-up. He came last week for his follow-up however he was ill and started vomiting in the exam room. Today he states he feels better however he continues to have vomiting episodes. He states he attempted to go to urgent care/the ED but could not find a parking spot. He subsequently went home. He has no issues or complaints today regarding the foot wound. He never followed up with general surgery for the abscess identified on ultrasound to the back of his right knee. 10/20; patient presents for follow-up. He has no issues or complaints today. Overall he feels well today. 10/27; patient presents for follow-up. He has no issues or complaints today. 11/3; patient presents for follow-up. He has no issues or complaints today. He states he does not want to follow-up with orthopedics to reevaluate the abscess in the back of his right leg. He denies signs of infection. 11/15; patient presents for follow-up. He has no issues or complaints today. He had been using silver alginate to the left first met head wound. He denies signs of infection. 11/22; patient presents for follow-up. He had Affinity placed at last clinic visit. He has no issues or complaints today. 11/29; patient presents for follow-up. He is in good spirits today. He has no issues or complaints today. 12/6; patient presents for follow-up. He has no issues or complaints today. 12/13; patient presents for follow-up. He denies signs of infection. He has no issues or complaints today. 12/20; patient presents for follow-up. He has no issues or complaints today. He denies signs of infection. 12/29; patient presents for follow-up. He states that a few days ago he has had reaccumulation of an abscess behind the right knee. He denies increased warmth or erythema to the area. He denies fever/chills or drainage. 1/6; patient presents for follow-up. He states he went to the [facility]  emergency department to be evaluated for the abscess to his right posterior knee. He was started on clindamycin. They were unable to drain the abscess. Patient states that eventually the abscess ruptured. He reports improvement in his symptoms. He has no issues or complaints about his left foot wound. 1/17; patient presents for follow-up. He has no issues or complaints today. 1/24; patient presents for follow-up. He has no issues or complaints today. He denies signs of infection. Patient History Information obtained from Patient. Family History Diabetes - Maternal Grandparents,Mother, Heart Disease - Father, Hypertension - Father, No family history of Cancer, Hereditary Spherocytosis, Kidney Disease, Lung Disease, Seizures, Stroke, Thyroid Problems, Tuberculosis. Social History Current every day smoker, Marital Status - Single, Alcohol Use - Never, Drug Use - No History, Caffeine Use - Never. Medical History Eyes Denies history of Cataracts, Glaucoma, Optic Neuritis Ear/Nose/Mouth/Throat Denies history of Chronic sinus problems/congestion, Middle ear problems Hematologic/Lymphatic Patient has history of Anemia Denies history of Hemophilia, Human Immunodeficiency Virus, Lymphedema, Sickle Cell Disease Respiratory Denies history of Aspiration, Asthma, Chronic Obstructive Pulmonary Disease (COPD), Pneumothorax, Sleep Apnea, Tuberculosis Cardiovascular Patient has history of Hypertension, Peripheral Venous Disease Denies history of Angina, Arrhythmia, Congestive Heart Failure, Coronary Artery Disease, Deep Vein Thrombosis, Hypotension, Myocardial Infarction, Peripheral Arterial Disease, Phlebitis, Vasculitis Gastrointestinal Denies history  of Cirrhosis , Colitis, Crohnoos, Hepatitis A, Hepatitis B, Hepatitis C Endocrine Patient has history of Type II Diabetes - oral meds Denies history of Type I Diabetes Genitourinary Denies history of End Stage Renal Disease Immunological Denies  history of Lupus Erythematosus, Raynaudoos, Scleroderma Integumentary (Skin) Denies history of History of Burn Musculoskeletal Denies history of Gout, Rheumatoid Arthritis, Osteoarthritis, Osteomyelitis Neurologic Denies history of Dementia, Neuropathy, Quadriplegia, Paraplegia, Seizure Disorder Oncologic Denies history of Received Chemotherapy, Received Radiation Psychiatric Denies history of Anorexia/bulimia, Confinement Anxiety Hospitalization/Surgery History - fever 105, sepsis cellulitis right popliteal fossa 04/09/2021. Medical A Surgical History Notes nd Constitutional Symptoms (General Health) falls , leukocytosis Respiratory During waking hours and catches himself not breathing, "gasp for air". Saw Dr Wynonia Lawman, he couldn't find a reason Objective Constitutional respirations regular, non-labored and within target range for patient.. Vitals Time Taken: 12:36 PM, Height: 77 in, Weight: 232 lbs, BMI: 27.5, Temperature: 97.6 F, Pulse: 88 bpm, Respiratory Rate: 18 breaths/min, Blood Pressure: 148/85 mmHg. Cardiovascular 2+ dorsalis pedis/posterior tibialis pulses. Psychiatric pleasant and cooperative. General Notes: Left leg: T the first met head there is an open wound with granulation tissue, circumferential callus and nonviable tissue. No signs of infection o Integumentary (Hair, Skin) Wound #2R status is Open. Original cause of wound was Blister. The date acquired was: 06/23/2020. The wound has been in treatment 69 weeks. The wound is located on the Left Metatarsal head first. The wound measures 0.6cm length x 0.3cm width x 0.5cm depth; 0.141cm^2 area and 0.071cm^3 volume. There is Fat Layer (Subcutaneous Tissue) exposed. There is undermining starting at 7:00 and ending at 12:00 with a maximum distance of 0.5cm. There is a medium amount of serosanguineous drainage noted. The wound margin is thickened. There is large (67-100%) red granulation within the wound bed. There is no  necrotic tissue within the wound bed. 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 No signs of infection. I debrided nonviable tissue. Affinity #10 was placed in standard fashion today. This is the last application. We will run an IVR again to see if he can be approved for more Affinity. I think he is doing well with this treatment. Procedures Wound #2R Pre-procedure diagnosis of Wound #2R is a Diabetic Wound/Ulcer of the Lower Extremity located on the Left Metatarsal head first .Severity of Tissue Pre Debridement is: Fat layer exposed. There was a Excisional Skin/Subcutaneous Tissue Debridement with a total area of 0.18 sq cm performed by Kalman Shan, DO. With the following instrument(s): Curette to remove Viable and Non-Viable tissue/material. Material removed includes Callus, Subcutaneous Tissue, and Slough after achieving pain control using Other (Benzocaine 20%). No specimens were taken. A time out was conducted at 12:50, prior to the start of the procedure. A Minimum amount of bleeding was controlled with Pressure. The procedure was tolerated well with a pain level of 4 throughout and a pain level of 4 following the procedure. Post Debridement Measurements: 0.6cm length x 0.3cm width x 0.5cm depth; 0.071cm^3 volume. Character of Wound/Ulcer Post Debridement is improved. Severity of Tissue Post Debridement is: Fat layer exposed. Post procedure Diagnosis Wound #2R: Same as Pre-Procedure Plan 1. In office sharp debridement 2. Affinity #10 placed in standard fashion 3. Follow-up in 1 week Electronic Signature(s) Signed: 12/16/2021 2:19:38  PM By: Kalman Shan DO Entered By: Kalman Shan on  12/16/2021 13:23:46 -------------------------------------------------------------------------------- HxROS Details Patient Name: Date of Service: [name] [name] 12/16/2021 12:45 PM Medical Record Number: [id number] Patient Account Number: [id number] Date of Birth/Sex: Treating RN: 02-Jan-1971 (51 y.o. [name] [name] Primary Care Provider: [name] Other Clinician: Referring Provider: Treating Provider/Extender: Delano Metz in Treatment: 29 Information Obtained From Patient Constitutional Symptoms (General Health) Medical History: Past Medical History Notes: falls , leukocytosis Eyes Medical History: Negative for: Cataracts; Glaucoma; Optic Neuritis Ear/Nose/Mouth/Throat Medical History: Negative for: Chronic sinus problems/congestion; Middle ear problems Hematologic/Lymphatic Medical History: Positive for: Anemia Negative for: Hemophilia; Human Immunodeficiency Virus; Lymphedema; Sickle Cell Disease Respiratory Medical History: Negative for: Aspiration; Asthma; Chronic Obstructive Pulmonary Disease (COPD); Pneumothorax; Sleep Apnea; Tuberculosis Past Medical History Notes: During waking hours and catches himself not breathing, "gasp for air". Saw Dr Wynonia Lawman, he couldn't find a reason Cardiovascular Medical History: Positive for: Hypertension; Peripheral Venous Disease Negative for: Angina; Arrhythmia; Congestive Heart Failure; Coronary Artery Disease; Deep Vein Thrombosis; Hypotension; Myocardial Infarction; Peripheral Arterial Disease; Phlebitis; Vasculitis Gastrointestinal Medical History: Negative for: Cirrhosis ; Colitis; Crohns; Hepatitis A; Hepatitis B; Hepatitis C Endocrine Medical History: Positive for: Type II Diabetes - oral meds Negative for: Type I Diabetes Time with diabetes: 2014 Treated with: Oral agents Blood sugar tested every day: Yes Tested : Blood sugar testing results: Breakfast: 91 Genitourinary Medical  History: Negative for: End Stage Renal Disease Immunological Medical History: Negative for: Lupus Erythematosus; Raynauds; Scleroderma Integumentary (Skin) Medical History: Negative for: History of Burn Musculoskeletal Medical History: Negative for: Gout; Rheumatoid Arthritis; Osteoarthritis; Osteomyelitis Neurologic Medical History: Negative for: Dementia; Neuropathy; Quadriplegia; Paraplegia; Seizure Disorder Oncologic Medical History: Negative for: Received Chemotherapy; Received Radiation Psychiatric Medical History: Negative for: Anorexia/bulimia; Confinement Anxiety Immunizations Pneumococcal Vaccine: Received Pneumococcal Vaccination: No Implantable Devices No devices added Hospitalization / Surgery History Type of Hospitalization/Surgery fever 105, sepsis cellulitis right popliteal fossa 04/09/2021 Family and Social History Cancer: No; Diabetes: Yes - Maternal Grandparents,Mother; Heart Disease: Yes - Father; Hereditary Spherocytosis: No; Hypertension: Yes - Father; Kidney Disease: No; Lung Disease: No; Seizures: No; Stroke: No; Thyroid Problems: No; Tuberculosis: No; Current every day smoker; Marital Status - Single; Alcohol Use: Never; Drug Use: No History; Caffeine Use: Never; Financial Concerns: No; Food, Clothing or Shelter Needs: No; Support System Lacking: No; Transportation Concerns: No Electronic Signature(s) Signed: 12/16/2021 2:19:38 PM By: Kalman Shan DO Signed: 12/16/2021 5:37:21 PM By: Deon Pilling RN, BSN Entered By: Kalman Shan on 12/16/2021 13:20:52 -------------------------------------------------------------------------------- SuperBill Details Patient Name: Date of Service: [name] [name] [place], [place]. 12/16/2021 Medical Record Number: [id number] Patient Account Number: [id number] Date of Birth/Sex: Treating RN: Mar 11, 1971 (51 y.o. 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

## 2021-12-23 ENCOUNTER — Encounter (HOSPITAL_BASED_OUTPATIENT_CLINIC_OR_DEPARTMENT_OTHER): Payer: Medicare Other | Admitting: Internal Medicine

## 2021-12-23 ENCOUNTER — Other Ambulatory Visit: Payer: Self-pay

## 2021-12-23 DIAGNOSIS — L97522 Non-pressure chronic ulcer of other part of left foot with fat layer exposed: Secondary | ICD-10-CM

## 2021-12-23 DIAGNOSIS — E11621 Type 2 diabetes mellitus with foot ulcer: Secondary | ICD-10-CM | POA: Diagnosis not present

## 2021-12-23 NOTE — Progress Notes (Signed)
DEFORREST, BOGLE ([id number]) Visit Report for 12/23/2021 Arrival Information Details Patient Name: Date of Service: [name] [name] 12/23/2021 2:00 PM Medical Record Number: [id number] Patient Account Number: [id number] Date of Birth/Sex: Treating RN: 1971-07-05 (51 y.o. [name], Vaughan Basta Primary Care Lakitha Gordy: [name] Other Clinician: Referring Leiland Mihelich: Treating Danisa Kopec/Extender: Delano Metz in Treatment: 2 Visit Information History Since Last Visit Added or deleted any medications: No Patient Arrived: Ambulatory Any new allergies or adverse reactions: No Arrival Time: 14:04 Had a fall or experienced change in No Accompanied By: self activities of daily living that may affect Transfer Assistance: None risk of falls: Patient Identification Verified: Yes Signs or symptoms of abuse/neglect since last visito No Secondary Verification Process Completed: Yes Hospitalized since last visit: No Patient Requires Transmission-Based Precautions: No Implantable device outside of the clinic excluding No Patient Has Alerts: No cellular tissue based products placed in the center since last visit: Has Dressing in Place as Prescribed: Yes Pain Present Now: Yes Electronic Signature(s) Signed: 12/23/2021 5:06:26 PM By: Baruch Gouty RN, BSN Entered By: Baruch Gouty on 12/23/2021 14:04:58 -------------------------------------------------------------------------------- Encounter Discharge Information Details Patient Name: Date of Service: [name] [name] ND, [name] 12/23/2021 2:00 PM Medical Record Number: [id number] Patient Account Number: [id number] Date of Birth/Sex: Treating RN: 1971/11/12 (51 y.o. [name] Primary Care Aislin Onofre: [name] Other Clinician: Referring Ketih Goodie: Treating Alyn Riedinger/Extender: Delano Metz in Treatment: 24 Encounter Discharge Information Items Post Procedure  Vitals Discharge Condition: Stable Temperature (F): 97.8 Ambulatory Status: Ambulatory Pulse (bpm): 82 Discharge Destination: Home Respiratory Rate (breaths/min): 18 Transportation: Private Auto Blood Pressure (mmHg): 173/97 Accompanied By: self Schedule Follow-up Appointment: Yes Clinical Summary of Care: Patient Declined Electronic Signature(s) Signed: 12/23/2021 5:33:56 PM By: Dellie Catholic RN Entered By: Dellie Catholic on 12/23/2021 15:18:32 -------------------------------------------------------------------------------- Lower Extremity Assessment Details Patient Name: Date of Service: [name], [name] 12/23/2021 2:00 PM Medical Record Number: [id number] Patient Account Number: [id number] Date of Birth/Sex: Treating RN: Mar 15, 1971 (51 y.o. [name] Primary Care Raghad Lorenz: [name] Other Clinician: Referring Travelle Mcclimans: Treating Michaiah Maiden/Extender: Delano Metz in Treatment: 70 Edema Assessment Assessed: [name]: No] [Right: No] Edema: [Left: Ye] [Right: s] Calf Left: Right: Point of Measurement: 50 cm From Medial Instep 41 cm Ankle Left: Right: Point of Measurement: 11 cm From Medial Instep 27 cm Electronic Signature(s) Signed: 12/23/2021 5:33:56 PM By: Dellie Catholic RN Entered By: Dellie Catholic on 12/23/2021 14:24:20 -------------------------------------------------------------------------------- Multi Wound Chart Details Patient Name: Date of Service: [name] [name] ND, [name] 12/23/2021 2:00 PM Medical Record Number: [id number] Patient Account Number: [id number] Date of Birth/Sex: Treating RN: [birth year] (51 y.o. [name] Primary Care Iriel Nason: [name] Other Clinician: Referring Aeon Kessner: Treating Earnestine Shipp/Extender: Delano Metz in Treatment: 3 Vital Signs Height(in): 77 Capillary Blood Glucose(mg/dl): 103 Weight(lbs): 232 Pulse(bpm): 82 Body Mass Index(BMI):  27.5 Blood Pressure(mmHg): 173/97 Temperature(F): 97.8 Respiratory Rate(breaths/min): 18 Photos: [N/A:N/A] Left Metatarsal head first N/A N/A Wound Location: Blister N/A N/A Wounding Event: Diabetic Wound/Ulcer of the Lower N/A N/A Primary Etiology: Extremity Anemia, Hypertension, Peripheral N/A N/A Comorbid History: Venous Disease, Type II Diabetes 06/23/2020 N/A N/A Date Acquired: 31 N/A N/A Weeks of Treatment: Open N/A N/A Wound Status: Yes N/A N/A Wound Recurrence: 1x0.5x0.3 N/A N/A Measurements L x W x D (cm) 0.393 N/A N/A A (cm) : rea 0.118 N/A N/A Volume (cm) : -1167.70% N/A N/A % Reduction in A rea: -3833.30% N/A N/A % Reduction in Volume: 7  Starting Position 1 (o'clock): 12 Ending Position 1 (o'clock): 0.4 Maximum Distance 1 (cm): Yes N/A N/A Undermining: Grade 2 N/A N/A Classification: Medium N/A N/A Exudate A mount: Serosanguineous N/A N/A Exudate Type: red, brown N/A N/A Exudate Color: Thickened N/A N/A Wound Margin: Large (67-100%) N/A N/A Granulation A mount: Red N/A N/A Granulation Quality: Small (1-33%) N/A N/A Necrotic A mount: Fat Layer (Subcutaneous Tissue): Yes N/A N/A Exposed Structures: Fascia: No Tendon: No Muscle: No Joint: No Bone: No Medium (34-66%) N/A N/A Epithelialization: Debridement - Excisional N/A N/A Debridement: Pre-procedure Verification/Time Out 14:35 N/A N/A Taken: Lidocaine 4% T opical Solution N/A N/A Pain Control: Subcutaneous, Slough N/A N/A Tissue Debrided: Skin/Subcutaneous Tissue N/A N/A Level: 0.5 N/A N/A Debridement A (sq cm): rea Curette N/A N/A Instrument: Moderate N/A N/A Bleeding: Silver Nitrate N/A N/A Hemostasis A chieved: 0 N/A N/A Procedural Pain: 0 N/A N/A Post Procedural Pain: Procedure was tolerated well N/A N/A Debridement Treatment Response: 1x0.5x0.3 N/A N/A Post Debridement Measurements L x W x D (cm) 0.118 N/A N/A Post Debridement Volume: (cm) Debridement  N/A N/A Procedures Performed: Treatment Notes Electronic Signature(s) Signed: 12/23/2021 3:28:24 PM By: Kalman Shan DO Signed: 12/23/2021 5:08:46 PM By: Deon Pilling RN, BSN Entered By: Kalman Shan on 12/23/2021 15:05:03 -------------------------------------------------------------------------------- Multi-Disciplinary Care Plan Details Patient Name: Date of Service: [name] [name] ND, [name] 12/23/2021 2:00 PM Medical Record Number: [id number] Patient Account Number: [id number] Date of Birth/Sex: Treating RN: 03-16-1971 (51 y.o. [name] Primary Care Micahel Omlor: [name] Other Clinician: Referring Bethanne Mule: Treating Maisy Newport/Extender: Delano Metz in Treatment: 70 Multidisciplinary Care Plan reviewed with physician Active Inactive Abuse / Safety / Falls / Self Care Management Nursing Diagnoses: History of Falls Goals: Patient/caregiver will verbalize/demonstrate measures taken to prevent injury and/or falls Date Initiated: 11/04/2021 Target Resolution Date: 01/06/2022 Goal Status: Active Interventions: Assess fall risk on admission and as needed Assess impairment of mobility on admission and as needed per policy Notes: Wound/Skin Impairment Nursing Diagnoses: Impaired tissue integrity Goals: Patient/caregiver will verbalize understanding of skin care regimen Date Initiated: 11/19/2020 Target Resolution Date: 01/09/2022 Goal Status: Active Ulcer/skin breakdown will have a volume reduction of 30% by week 4 Date Initiated: 08/16/2020 Date Inactivated: 03/03/2021 Target Resolution Date: 03/10/2021 Goal Status: Met Interventions: Provide education on ulcer and skin care Notes: 07/24/21: Wound care regimen ongoing. Electronic Signature(s) Signed: 12/23/2021 5:33:56 PM By: Dellie Catholic RN Entered By: Dellie Catholic on 12/23/2021 14:33:58 -------------------------------------------------------------------------------- Pain  Assessment Details Patient Name: Date of Service: [name] [name] ND, [name] 12/23/2021 2:00 PM Medical Record Number: [id number] Patient Account Number: [id number] Date of Birth/Sex: Treating RN: [birth year] (51 y.o. [name] Primary Care Aris Even: [name] Other Clinician: Referring Tagen Milby: Treating Aianna Fahs/Extender: Delano Metz in Treatment: 87 Active Problems Location of Pain Severity and Description of Pain Patient Has Paino Yes Site Locations Pain Location: Pain in Ulcers With Dressing Change: No Duration of the Pain. Constant / Intermittento Constant Rate the pain. Current Pain Level: 5 Worst Pain Level: 7 Least Pain Level: 3 Character of Pain Describe the Pain: Other: prickly Pain Management and Medication Current Pain Management: Medication: Yes Is the Current Pain Management Adequate: Adequate How does your wound impact your activities of daily livingo Sleep: Yes Bathing: No Appetite: No Relationship With Others: No Bladder Continence: No Emotions: No Bowel Continence: No Drive: No Toileting: No Hobbies: No Dressing: No Electronic Signature(s) Signed: 12/23/2021 5:06:26 PM By: Baruch Gouty RN, BSN Entered By: Baruch Gouty on 12/23/2021  14:06:40 -------------------------------------------------------------------------------- Patient/Caregiver Education Details Patient Name: Date of Service: [name] [name] 1/31/2023andnbsp2:00 PM Medical Record Number: [id number] Patient Account Number: [id number] Date of Birth/Gender: Treating RN: 1971-03-12 (51 y.o. [name] Primary Care Physician: [name] Other Clinician: Referring Physician: Treating Physician/Extender: Delano Metz in Treatment: 46 Education Assessment Education Provided To: Patient Education Topics Provided Wound/Skin Impairment: Methods: Explain/Verbal Responses: Return demonstration  correctly Electronic Signature(s) Signed: 12/23/2021 5:33:56 PM By: Dellie Catholic RN Entered By: Dellie Catholic on 12/23/2021 15:05:32 -------------------------------------------------------------------------------- Wound Assessment Details Patient Name: Date of Service: [name] [name] ND, [name] 12/23/2021 2:00 PM Medical Record Number: [id number] Patient Account Number: [id number] Date of Birth/Sex: Treating RN: [birth year] (51 y.o. [name] Primary Care Juleon Narang: [name] Other Clinician: Referring Kagan Mutchler: Treating Tariah Transue/Extender: Delano Metz in Treatment: 46 Wound Status Wound Number: 2R Primary Diabetic Wound/Ulcer of the Lower Extremity Etiology: Wound Location: Left Metatarsal head first Wound Status: Open Wounding Event: Blister Comorbid Anemia, Hypertension, Peripheral Venous Disease, Type II Date Acquired: 06/23/2020 History: Diabetes Weeks Of Treatment: 70 Clustered Wound: No Photos Wound Measurements Length: (cm) 1 Width: (cm) 0.5 Depth: (cm) 0.3 Area: (cm) 0.393 Volume: (cm) 0.118 % Reduction in Area: -1167.7% % Reduction in Volume: -3833.3% Epithelialization: Medium (34-66%) Undermining: Yes Starting Position (o'clock): 7 Ending Position (o'clock): 12 Maximum Distance: (cm) 0.4 Wound Description Classification: Grade 2 Wound Margin: Thickened Exudate Amount: Medium Exudate Type: Serosanguineous Exudate Color: red, brown Foul Odor After Cleansing: No Slough/Fibrino Yes Wound Bed Granulation Amount: Large (67-100%) Exposed Structure Granulation Quality: Red Fascia Exposed: No Necrotic Amount: Small (1-33%) Fat Layer (Subcutaneous Tissue) Exposed: Yes Tendon Exposed: No Muscle Exposed: No Joint Exposed: No Bone Exposed: No Treatment Notes Wound #2R (Metatarsal head first) Wound Laterality: Left Cleanser Soap and Water Discharge Instruction: May shower and wash wound with dial antibacterial soap  and water prior to dressing change. Wound Cleanser Discharge Instruction: Cleanse the wound with wound cleanser prior to applying a clean dressing using gauze sponges, not tissue or cotton balls. Peri-Wound Care Sween Lotion (Moisturizing lotion) Discharge Instruction: Apply moisturizing lotion to leg Topical Primary Dressing Hydrofera Blue Classic Foam, 4x4 in Discharge Instruction: Moisten with saline prior to applying to wound bed Secondary Dressing Woven Gauze Sponges 2x2 in Discharge Instruction: Apply over primary dressing as directed. Optifoam Non-Adhesive Dressing, 4x4 in Discharge Instruction: cut to make foam donut to help offload Secured With [place] [facility] T ape, 2x2 (in/yd) Discharge Instruction: Secure dressing with tape as directed. Compression Wrap Compression Stockings Add-Ons Electronic Signature(s) Signed: 12/23/2021 5:08:46 PM By: Deon Pilling RN, BSN Signed: 12/23/2021 5:33:56 PM By: Dellie Catholic RN Entered By: Deon Pilling on 12/23/2021 14:24:42 -------------------------------------------------------------------------------- Vitals Details Patient Name: Date of Service: [name], [name] 12/23/2021 2:00 PM Medical Record Number: [id number] Patient Account Number: [id number] Date of Birth/Sex: Treating RN: 1970-12-06 (51 y.o. [name] Primary Care Eward Rutigliano: [name] Other Clinician: Referring Milli Woolridge: Treating Kohei Antonellis/Extender: Delano Metz in Treatment: 76 Vital Signs Time Taken: 14:02 Temperature (F): 97.8 Height (in): 77 Pulse (bpm): 82 Weight (lbs): 232 Respiratory Rate (breaths/min): 18 Body Mass Index (BMI): 27.5 Blood Pressure (mmHg): 173/97 Capillary Blood Glucose (mg/dl): 103 Reference Range: 80 - 120 mg / dl Notes glucose per pt report this am Electronic Signature(s) Signed: 12/23/2021 5:06:26 PM By: Baruch Gouty RN, BSN Entered By: Baruch Gouty on  12/23/2021 14:05:46

## 2021-12-23 NOTE — Progress Notes (Signed)
CHEVELLE, DURR ([id number]) Visit Report for 12/23/2021 Chief Complaint Document Details Patient Name: Date of Service: [name] [name] [name] 12/23/2021 2:00 PM Medical Record Number: [id number] Patient Account Number: [id number] Date of Birth/Sex: Treating RN: March 15, 1971 (51 y.o. [name] [name] Primary Care Provider: [name] Other Clinician: Referring Provider: Treating Provider/Extender: Delano Metz in Treatment: 44 Information Obtained from: Patient Chief Complaint patient is here for a review of the wound on his plantar left first metatarsal head and back of his right leg Electronic Signature(s) Signed: 12/23/2021 3:28:24 PM By: Kalman Shan DO Entered By: Kalman Shan on 12/23/2021 15:06:16 -------------------------------------------------------------------------------- Debridement Details Patient Name: Date of Service: [name], [name] 12/23/2021 2:00 PM Medical Record Number: [id number] Patient Account Number: [id number] Date of Birth/Sex: Treating RN: [birth year] (51 y.o. [name] Primary Care Provider: [name] Other Clinician: Referring Provider: Treating Provider/Extender: Delano Metz in Treatment: 35 Debridement Performed for Assessment: Wound #2R Left Metatarsal head first Performed By: Physician Kalman Shan, DO Debridement Type: Debridement Severity of Tissue Pre Debridement: Fat layer exposed Level of Consciousness (Pre-procedure): Awake and Alert Pre-procedure Verification/Time Out Yes - 14:35 Taken: Start Time: 14:35 Pain Control: Lidocaine 4% T opical Solution T Area Debrided (L x W): otal 1 (cm) x 0.5 (cm) = 0.5 (cm) Tissue and other material debrided: Non-Viable, Slough, Subcutaneous, Slough Level: Skin/Subcutaneous Tissue Debridement Description: Excisional Instrument: Curette Bleeding: Moderate Hemostasis Achieved: Silver Nitrate End Time:  14:37 Procedural Pain: 0 Post Procedural Pain: 0 Response to Treatment: Procedure was tolerated well Level of Consciousness (Post- Awake and Alert procedure): Post Debridement Measurements of Total Wound Length: (cm) 1 Width: (cm) 0.5 Depth: (cm) 0.3 Volume: (cm) 0.118 Character of Wound/Ulcer Post Debridement: Improved Severity of Tissue Post Debridement: Fat layer exposed Post Procedure Diagnosis Same as Pre-procedure Electronic Signature(s) Signed: 12/23/2021 2:44:49 PM By: Kalman Shan DO Signed: 12/23/2021 5:33:56 PM By: Dellie Catholic RN Entered By: Dellie Catholic on 12/23/2021 14:39:46 -------------------------------------------------------------------------------- HPI Details Patient Name: Date of Service: [name], [name] 12/23/2021 2:00 PM Medical Record Number: [id number] Patient Account Number: [id number] Date of Birth/Sex: Treating RN: 1971-05-07 (51 y.o. [name] [name] Primary Care Provider: [name] Other Clinician: Referring Provider: Treating Provider/Extender: Delano Metz in Treatment: 66 History of Present Illness HPI Description: 04/21/18 ADMISSION This is a 51 year old man who is a type II diabetic. In spite of fact his hemoglobin A1c is actually quite good 5.73 months ago he is at a really difficult time over the last 6-7 months. He developed a rapidly progressive infection in the right foot in November 2018 associated with osteomyelitis and necrotizing fasciitis. 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 The stump required and a revision on 11/24/17. The stump revision was advertised as being secondary to falls. I'm not sure the progressive history here however this area is actually closed over. The patient tells me over the same timeframe he has had wounds on the plantar aspect of his left foot. In the ED saw Dr. Sharol Given in April of this year. 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 It is really not clear that this patient is been dressing this with anything. He came in with the clinic without any specific dressing on the wound areas using his own tennis shoe. He tells me that he only ambulates of course to do a pivot transfer. He does not have his prosthesis for  the right leg as of yet and he blames Medicaid for this. He does however use the foot to push himself along in his wheelchair at home. The patient has not had formal arterial studies. ABI in our clinic on the left was 1.13. Patient's past medical history includes type 2 diabetes, fracture of the right fibula. I see that he was treated for abscesses on his right buttock and chest in 2016. I did not look at the microbiology of this. 04/28/18; patient comes back in the clinic today with the wound pretty much the same as when he came in here last week. Small opening lots of undermining relatively. He tells [place] that nothing is really been on this for 3 days in spite of the fact that we gave him enough to dress this easily especially such a small wound. He says he lives with his mother, she is not capable of assisting with this he is changing the dressings himself. 05/05/18; much better-looking wound today. Smaller. There is some undermining medially however that's only perhaps 2 mm. No surrounding erythema. We've been using silver collagen 05/12/18; small wound on the first metatarsal head. No undermining no surrounding erythema. We've been using silver collagen he has home health coming out to change 05/20/18 the wound on the first metatarsal head looks better. Covered in surface debris/callus nonviable tissue. Required debridement but post debridement this looks quite good. We've been using silver collagen. He has home health 05/27/18; first metatarsal head wound continues to improve. Just about completely closed. Still a lot of surface debridement callus. We've been using silver collagen 06/06/18; the first  metatarsal wound is completely healed over. There is still a lot of callus. I gently removed some of this just to make sure that there was no open area and there is not. The patient mentions to me that his left leg has felt like "lead" for about a week READMISSION 08/16/2020 This is a 51 year old man with type 2 diabetes. He returns to clinic today with a 1 month history of a blister and callus over the first metatarsal head. This is in exactly the same area as when he was here in 2019. 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 He has standard running shoes on the left foot to match the area in his prosthesis. He has only been covering this area with a Band-Aid has not really been specifically dressing this. ABI in our clinic on the left was 1.16. This is essentially stable from the value in 2019 10/1 the patient has a linear wound over the left first metatarsal head. Again not a lot of callus thick skin around this that I removed with a #3 curette. This is not go deep to bone or muscle it does not look to be infected. 10/8 first metatarsal head. The wound looks like it is closing however this is going to be a difficult area to offload in the future. He has a size 15 shoe and he says his shoes are years old. The inserts in these current shoes are totally useless and I have advised him to replace these. He may need a new pair of shoes and I have he got original prosthesis at [facility] I have asked him to go back there. 11/9; the patient has not been here in more than a month. Not sure he was healed when he was here the last time. I told him he would  have to get new shoes and certainly offload the area over the left first metatarsal head. He has done nothing of the kind. He arrives back in clinic with the same old new balance sneakers. A very large separating callus over the first metatarsal head on the left 11/16; he has purchased new shoes and  has at least 2 insoles like I asked. The wound is measuring much smaller. He is using silver alginate he changes the dressing himself 11/30; wound is measurably smaller in length of about a half a centimeter. This is generally very little depth. We have been using silver alginate. He changes the dressing himself every second day. 12/14; wound is about the same size. Significant undermining medially relative to the size of the wound. We have been using silver alginate. There is not a way to offload this area as he walks with a prosthesis on the right leg 12/28; wound is about the same size. Again he has undermining medially. I am using polymen on this 12/02/2020; the wound looks smaller. Still a senescent edge. We have been using polymen 1/24; the wound is come down a few millimeters. Still a senescent edge. I have been using polymen 2/8; the wound is come down slightly. Still with slight undermining from 12-6 o'clock I have been using polymen partially to get offloading on this area which is opposed by his prosthesis on the other leg. 2/22 quite open improvement he still has a comma shaped wound on the left first metatarsal head. Some depth. Using polymen 3/14; he still has the same problem of a comma shaped area over the left first metatarsal head. This seems to have had more depth today at 0.6 cm. We have been using polymen. The patient changes this himself every second day. I explored the idea of a total contact cast on the left leg however this is his driving foot. He was not enamored with the idea of not being able to drive and states that he does not have anybody else to get him around 3/28; again he comes in with a smaller looking wound however there is depth and undermining requiring debridement. We have been using Hydrofera Blue, changed to silver collagen today. The patient is doing the dressing himself 4/11; patient presents today for follow-up of his left diabetic foot wound. He denies  any issues in the past 2 weeks. He is currently using silver collagen every other day with dressing changes. He does not use diabetic shoes or special inserts to his sneakers. He does not use felt pads for offloading. 4/19; patient presents for his 1 week follow-up. He denies any issues. He reports using Hydrofera Blue and has not been using silver collagen for dressing changes. He reports minimal drainage to the wound. He overall feels well. 5/3; patient presents for 2-week follow-up. He has been using silver collagen without issues. He has no complaints today and states he overall feels well. 5/17; patient presents for 2-week follow-up. He states that the sleeve is rubbing up against the back of his right leg and causing a wound. He states this happened a week ago and has not been dressing the wound. The plantar wound is stable and he has been using collagen every other day. 5/24; Patient was had a recent hospitalization for cellulitis of his right popliteal fossa. He was admitted and started on IV antibiotics. He does not recall the plantar wound dressed while inpatient. He feels a lot better today. He was discharged on 5/21 on oral  antibiotics and has not picked this up until today. He reports using collagen to the plantar wound since discharge. He is keeping the right posterior knee wound covered. He is getting a new sleeve for his prostatic on 5/26. He currently denies signs of infection. 6/1; patient presents for 1 week follow-up. He is still on doxycycline. He has not been dressing the back of the right knee wound. He states he is receiving his new prosthetic sleeve tomorrow. He states he is using silver alginate to the plantar foot wound. He currently denies signs of infection. He states he has not been offloading the left foot wound 6/7; patient presents for 1 week follow-up. He completes his last dose of doxycycline today. He finally received his prosthetic sleeve for the right side and  he states it feels much better. He has been using antibiotic ointment to the back of that right knee wound. He has been using silver alginate to the plantar foot wound. He currently denies signs of infection. 6/21; patient presents for 2-week follow-up. He reports no issues with his prosthetic sleeve. He has been using antibiotic ointment to the back of the right knee. He has been using silver alginate to the plantar foot wound. He has no issues or complaints today. He denies signs of infection. 7/5; patient presents for 2-week follow-up. He reports no issues or complaints today. He continues to use antibiotic ointment to the right posterior knee wound and silver alginate to the left plantar foot wound. He denies signs of infection. 7/19; patient presents for 2-week follow-up. He continues to use antibiotic ointment to the right posterior knee and silver alginate to the left plantar foot wound. He has no issues or complaints today. He denies signs of infection. 8/4; patient presents for 2-week follow-up. He has no issues or complaints today. He denies signs of infection. 8/18; patient presents for 2-week follow-up. He has no issues or concerns today. He has been approved for Apligraf and he would like to have this placed today. He denies signs of infection and reports that his right posterior knee wound is closed. 8/25; patient presents for 1 week follow-up. He had no issues with his first Apligraf placement last week. He did however develop an abscess to the back of his right knee. He reports tenderness to this area. He denies systemic signs of infection. 9/1; patient presents for 1 week follow-up. He has had no issues with his plantar foot wound. The Apligraf has stayed in place over the past week. A wound culture was done at last clinic visit and a new antibiotic was sent to the pharmacy. Patient has not received this yet. He reports some mild tenderness to the back of his right knee. He denies  systemic signs of infection. 9/8; patient presents for 1 week follow-up. He states he took Keflex and had diarrhea as a side effect. He was able to complete the course. He reports continued tenderness to the right posterior knee wound. He has some drainage still. He denies increased warmth or erythema to the area. He denies fever/chills or nausea/vomiting. He has no issues or complaints today about his plantar foot wound. 9/15; patient presents for follow-up. He reports improvement in pain to the back of his right leg. He does not report any drainage. He had an ultrasound completed on 9/9 that showed a superficial fluid collection concerning for abscess. We attempted to call patient with results with no answer. We did put an urgent referral to general surgery and he  reports he has not received a call. He currently denies systemic signs of infection. He reports no issues to his left plantar foot wound. 9/27; patient presents for follow-up. He has no issues or complaints today. He has had no issues with the previous wound to the back of his right leg. He has not made an appointment to see general surgery. He had Apligraf placed at last clinic visit to the plantar foot wound. He currently denies signs of infection. 10/7; patient presents for follow-up. Upon entering the room there is vomit throughout the entire floor. Patient states he does not feel well. I recommended he go to urgent care. I recommended following up next week for wound care. 10/10; patient presents for follow-up. He came last week for his follow-up however he was ill and started vomiting in the exam room. Today he states he feels better however he continues to have vomiting episodes. He states he attempted to go to urgent care/the ED but could not find a parking spot. He subsequently went home. He has no issues or complaints today regarding the foot wound. He never followed up with general surgery for the abscess identified on  ultrasound to the back of his right knee. 10/20; patient presents for follow-up. He has no issues or complaints today. Overall he feels well today. 10/27; patient presents for follow-up. He has no issues or complaints today. 11/3; patient presents for follow-up. He has no issues or complaints today. He states he does not want to follow-up with orthopedics to reevaluate the abscess in the back of his right leg. He denies signs of infection. 11/15; patient presents for follow-up. He has no issues or complaints today. He had been using silver alginate to the left first met head wound. He denies signs of infection. 11/22; patient presents for follow-up. He had Affinity placed at last clinic visit. He has no issues or complaints today. 11/29; patient presents for follow-up. He is in good spirits today. He has no issues or complaints today. 12/6; patient presents for follow-up. He has no issues or complaints today. 12/13; patient presents for follow-up. He denies signs of infection. He has no issues or complaints today. 12/20; patient presents for follow-up. He has no issues or complaints today. He denies signs of infection. 12/29; patient presents for follow-up. He states that a few days ago he has had reaccumulation of an abscess behind the right knee. He denies increased warmth or erythema to the area. He denies fever/chills or drainage. 1/6; patient presents for follow-up. He states he went to the [facility] emergency department to be evaluated for the abscess to his right posterior knee. He was started on clindamycin. They were unable to drain the abscess. Patient states that eventually the abscess ruptured. He reports improvement in his symptoms. He has no issues or complaints about his left foot wound. 1/17; patient presents for follow-up. He has no issues or complaints today. 1/24; patient presents for follow-up. He has no issues or complaints today. He denies signs of infection. 1/31; patient  presents for follow-up. He has been approved for Affinity however has a 20% co-pay. Patient has declined further Affinity treatments due to this. He currently denies signs of infection. Electronic Signature(s) Signed: 12/23/2021 3:28:24 PM By: Kalman Shan DO Entered By: Kalman Shan on 12/23/2021 15:20:11 -------------------------------------------------------------------------------- Physical Exam Details Patient Name: Date of Service: [name], [name] 12/23/2021 2:00 PM Medical Record Number: [id number] Patient Account Number: [id number] Date of Birth/Sex: Treating RN: [birth year] (51 y.o. M)  Deon Pilling Primary Care Provider: [name] Other Clinician: Referring Provider: Treating Provider/Extender: Delano Metz in Treatment: 40 Constitutional respirations regular, non-labored and within target range for patient.. Cardiovascular 2+ dorsalis pedis/posterior tibialis pulses. Psychiatric pleasant and cooperative. Notes Left leg: T the first met head there is an open wound with granulation tissue, circumferential callus and nonviable tissue. No signs of infection o Electronic Signature(s) Signed: 12/23/2021 3:28:24 PM By: Kalman Shan DO Entered By: Kalman Shan on 12/23/2021 15:21:09 -------------------------------------------------------------------------------- Physician Orders Details Patient Name: Date of Service: [name], [name] 12/23/2021 2:00 PM Medical Record Number: [id number] Patient Account Number: [id number] Date of Birth/Sex: Treating RN: [birth year] (51 y.o. [name] Primary Care Provider: [name] Other Clinician: Referring Provider: Treating Provider/Extender: Delano Metz in Treatment: 78 Verbal / Phone Orders: No Diagnosis Coding Follow-up Appointments ppointment in 1 week. - with Dr. Heber Linden Return A Edema Control - Lymphedema / SCD / Other Elevate legs to  the level of the heart or above for 30 minutes daily and/or when sitting, a frequency of: - throughout the day Avoid standing for long periods of time. Off-Loading Other: - minimize walking on left foot related to pressure to wound. Aid in wound healing. Wound Treatment Wound #2R - Metatarsal head first Wound Laterality: Left Cleanser: Soap and Water 1 x Per Week/15 Days Discharge Instructions: May shower and wash wound with dial antibacterial soap and water prior to dressing change. Cleanser: Wound Cleanser (Generic) 1 x Per Week/15 Days Discharge Instructions: Cleanse the wound with wound cleanser prior to applying a clean dressing using gauze sponges, not tissue or cotton balls. Peri-Wound Care: Sween Lotion (Moisturizing lotion) 1 x Per Week/15 Days Discharge Instructions: Apply moisturizing lotion to leg Prim Dressing: Hydrofera Blue Classic Foam, 4x4 in 1 x Per Week/15 Days ary Discharge Instructions: Moisten with saline prior to applying to wound bed Secondary Dressing: Woven Gauze Sponges 2x2 in (Generic) 1 x Per Week/15 Days Discharge Instructions: Apply over primary dressing as directed. Secondary Dressing: Optifoam Non-Adhesive Dressing, 4x4 in (Generic) 1 x Per Week/15 Days Discharge Instructions: cut to make foam donut to help offload Secured With: 72M [place] Surgical Tape, 2x2 (in/yd) (Generic) 1 x Per Week/15 Days Discharge Instructions: Secure dressing with tape as directed. Radiology X-ray, Left foot - Left foot X-Ray related to diabetic non-healing ulcer looking for infection. CPT S1689239; ICD10 E11.621 Electronic Signature(s) Signed: 12/23/2021 3:28:24 PM By: Kalman Shan DO Entered By: Kalman Shan on 12/23/2021 15:21:59 Prescription 12/23/2021 -------------------------------------------------------------------------------- Mcgaughey, Lanny L. Kalman Shan DO Patient Name: Provider: November 09, 1971 [id number] Date of Birth: NPI#: [name]  [id number] Sex: DEA #: [phone] Phone #: License #: [place] Patient Address: [address], [place] [address] [place], [place] [address] [phone] Allergies adhesive tape Provider's Orders X-ray, Left foot - Left foot X-Ray related to diabetic non-healing ulcer looking for infection. CPT S1689239; ICD10 E11.621 Hand Signature: Date(s): Electronic Signature(s) Signed: 12/23/2021 3:28:24 PM By: Kalman Shan DO Entered By: Kalman Shan on 12/23/2021 15:21:59 -------------------------------------------------------------------------------- Problem List Details Patient Name: Date of Service: [name], [name] 12/23/2021 2:00 PM Medical Record Number: [id number] Patient Account Number: [id number] Date of Birth/Sex: Treating RN: [birth year] (51 y.o. [name] [name] Primary Care Provider: [name] Other Clinician: Referring Provider: Treating Provider/Extender: Delano Metz in Treatment: 50 Active Problems ICD-10 Encounter Code Description Active Date MDM Diagnosis L97.522 Non-pressure  chronic ulcer of other part of left foot with fat layer exposed 08/16/2020 No Yes E11.621 Type 2 diabetes mellitus with foot ulcer 08/16/2020 No Yes E11.42 Type 2 diabetes mellitus with diabetic polyneuropathy 08/16/2020 No Yes Z89.511 Acquired absence of right leg below knee 08/16/2020 No Yes Inactive Problems Resolved Problems ICD-10 Code Description Active Date Resolved Date S81.801D Unspecified open wound, right lower leg, subsequent encounter 04/08/2021 04/08/2021 Electronic Signature(s) Signed: 12/23/2021 3:28:24 PM By: Kalman Shan DO Entered By: Kalman Shan on 12/23/2021 15:04:54 -------------------------------------------------------------------------------- Progress Note Details Patient Name: Date of Service: [name], [name] 12/23/2021  2:00 PM Medical Record Number: [id number] Patient Account Number: [id number] Date of Birth/Sex: Treating RN: April 28, 1971 (51 y.o. [name] [name] Primary Care Provider: [name] Other Clinician: Referring Provider: Treating Provider/Extender: Delano Metz in Treatment: 58 Subjective Chief Complaint Information obtained from Patient patient is here for a review of the wound on his plantar left first metatarsal head and back of his right leg History of Present Illness (HPI) 04/21/18 ADMISSION This is a 51 year old man who is a type II diabetic. In spite of fact his hemoglobin A1c is actually quite good 5.73 months ago he is at a really difficult time over the last 6-7 months. He developed a rapidly progressive infection in the right foot in November 2018 associated with osteomyelitis and necrotizing fasciitis. 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 The stump required and a revision on 11/24/17. The stump revision was advertised as being secondary to falls. I'm not sure the progressive history here however this area is actually closed over. The patient tells me over the same timeframe he has had wounds on the plantar aspect of his left foot. In the ED saw Dr. Sharol Given in April of this year. 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 It is really not clear that this patient is been dressing this with anything. He came in with the clinic without any specific dressing on the wound areas using his own tennis shoe. He tells me that he only ambulates of course to do a pivot transfer. He does not have his prosthesis for the right leg as of yet and he blames Medicaid for this. He does however use the foot to push himself along in his wheelchair at home. The patient has not had formal arterial studies. ABI in our clinic on the left was 1.13. Patient's past medical history includes type 2 diabetes, fracture of the right fibula. I see  that he was treated for abscesses on his right buttock and chest in 2016. I did not look at the microbiology of this. 04/28/18; patient comes back in the clinic today with the wound pretty much the same as when he came in here last week. Small opening lots of undermining relatively. He tells [place] that nothing is really been on this for 3 days in spite of the fact that we gave him enough to dress this easily especially such a small wound. He says he lives with his mother, she is not capable of assisting with this he is changing the dressings himself. 05/05/18; much better-looking wound today. Smaller. There is some undermining medially however that's only perhaps 2 mm. No surrounding erythema. We've been using silver collagen 05/12/18; small wound on the first metatarsal head. No undermining no surrounding erythema. We've been using silver collagen he has home health coming out to change 05/20/18 the wound on the first metatarsal head  looks better. Covered in surface debris/callus nonviable tissue. Required debridement but post debridement this looks quite good. We've been using silver collagen. He has home health 05/27/18; first metatarsal head wound continues to improve. Just about completely closed. Still a lot of surface debridement callus. We've been using silver collagen 06/06/18; the first metatarsal wound is completely healed over. There is still a lot of callus. I gently removed some of this just to make sure that there was no open area and there is not. The patient mentions to me that his left leg has felt like "lead" for about a week READMISSION 08/16/2020 This is a 51 year old man with type 2 diabetes. He returns to clinic today with a 1 month history of a blister and callus over the first metatarsal head. This is in exactly the same area as when he was here in 2019. 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 He has standard running shoes on the left  foot to match the area in his prosthesis. He has only been covering this area with a Band-Aid has not really been specifically dressing this. ABI in our clinic on the left was 1.16. This is essentially stable from the value in 2019 10/1 the patient has a linear wound over the left first metatarsal head. Again not a lot of callus thick skin around this that I removed with a #3 curette. This is not go deep to bone or muscle it does not look to be infected. 10/8 first metatarsal head. The wound looks like it is closing however this is going to be a difficult area to offload in the future. He has a size 15 shoe and he says his shoes are years old. The inserts in these current shoes are totally useless and I have advised him to replace these. He may need a new pair of shoes and I have he got original prosthesis at [facility] I have asked him to go back there. 11/9; the patient has not been here in more than a month. Not sure he was healed when he was here the last time. I told him he would have to get new shoes and certainly offload the area over the left first metatarsal head. He has done nothing of the kind. He arrives back in clinic with the same old new balance sneakers. A very large separating callus over the first metatarsal head on the left 11/16; he has purchased new shoes and has at least 2 insoles like I asked. The wound is measuring much smaller. He is using silver alginate he changes the dressing himself 11/30; wound is measurably smaller in length of about a half a centimeter. This is generally very little depth. We have been using silver alginate. He changes the dressing himself every second day. 12/14; wound is about the same size. Significant undermining medially relative to the size of the wound. We have been using silver alginate. There is not a way to offload this area as he walks with a prosthesis on the right leg 12/28; wound is about the same size. Again he has undermining  medially. I am using polymen on this 12/02/2020; the wound looks smaller. Still a senescent edge. We have been using polymen 1/24; the wound is come down a few millimeters. Still a senescent edge. I have been using polymen 2/8; the wound is come down slightly. Still with slight undermining from 12-6 o'clock I have been using  polymen partially to get offloading on this area which is opposed by his prosthesis on the other leg. 2/22 quite open improvement he still has a comma shaped wound on the left first metatarsal head. Some depth. Using polymen 3/14; he still has the same problem of a comma shaped area over the left first metatarsal head. This seems to have had more depth today at 0.6 cm. We have been using polymen. The patient changes this himself every second day. I explored the idea of a total contact cast on the left leg however this is his driving foot. He was not enamored with the idea of not being able to drive and states that he does not have anybody else to get him around 3/28; again he comes in with a smaller looking wound however there is depth and undermining requiring debridement. We have been using Hydrofera Blue, changed to silver collagen today. The patient is doing the dressing himself 4/11; patient presents today for follow-up of his left diabetic foot wound. He denies any issues in the past 2 weeks. He is currently using silver collagen every other day with dressing changes. He does not use diabetic shoes or special inserts to his sneakers. He does not use felt pads for offloading. 4/19; patient presents for his 1 week follow-up. He denies any issues. He reports using Hydrofera Blue and has not been using silver collagen for dressing changes. He reports minimal drainage to the wound. He overall feels well. 5/3; patient presents for 2-week follow-up. He has been using silver collagen without issues. He has no complaints today and states he overall feels well. 5/17; patient presents  for 2-week follow-up. He states that the sleeve is rubbing up against the back of his right leg and causing a wound. He states this happened a week ago and has not been dressing the wound. The plantar wound is stable and he has been using collagen every other day. 5/24; Patient was had a recent hospitalization for cellulitis of his right popliteal fossa. He was admitted and started on IV antibiotics. He does not recall the plantar wound dressed while inpatient. He feels a lot better today. He was discharged on 5/21 on oral antibiotics and has not picked this up until today. He reports using collagen to the plantar wound since discharge. He is keeping the right posterior knee wound covered. He is getting a new sleeve for his prostatic on 5/26. He currently denies signs of infection. 6/1; patient presents for 1 week follow-up. He is still on doxycycline. He has not been dressing the back of the right knee wound. He states he is receiving his new prosthetic sleeve tomorrow. He states he is using silver alginate to the plantar foot wound. He currently denies signs of infection. He states he has not been offloading the left foot wound 6/7; patient presents for 1 week follow-up. He completes his last dose of doxycycline today. He finally received his prosthetic sleeve for the right side and he states it feels much better. He has been using antibiotic ointment to the back of that right knee wound. He has been using silver alginate to the plantar foot wound. He currently denies signs of infection. 6/21; patient presents for 2-week follow-up. He reports no issues with his prosthetic sleeve. He has been using antibiotic ointment to the back of the right knee. He has been using silver alginate to the plantar foot wound. He has no issues or complaints today. He denies signs of infection. 7/5; patient  presents for 2-week follow-up. He reports no issues or complaints today. He continues to use antibiotic ointment to  the right posterior knee wound and silver alginate to the left plantar foot wound. He denies signs of infection. 7/19; patient presents for 2-week follow-up. He continues to use antibiotic ointment to the right posterior knee and silver alginate to the left plantar foot wound. He has no issues or complaints today. He denies signs of infection. 8/4; patient presents for 2-week follow-up. He has no issues or complaints today. He denies signs of infection. 8/18; patient presents for 2-week follow-up. He has no issues or concerns today. He has been approved for Apligraf and he would like to have this placed today. He denies signs of infection and reports that his right posterior knee wound is closed. 8/25; patient presents for 1 week follow-up. He had no issues with his first Apligraf placement last week. He did however develop an abscess to the back of his right knee. He reports tenderness to this area. He denies systemic signs of infection. 9/1; patient presents for 1 week follow-up. He has had no issues with his plantar foot wound. The Apligraf has stayed in place over the past week. A wound culture was done at last clinic visit and a new antibiotic was sent to the pharmacy. Patient has not received this yet. He reports some mild tenderness to the back of his right knee. He denies systemic signs of infection. 9/8; patient presents for 1 week follow-up. He states he took Keflex and had diarrhea as a side effect. He was able to complete the course. He reports continued tenderness to the right posterior knee wound. He has some drainage still. He denies increased warmth or erythema to the area. He denies fever/chills or nausea/vomiting. He has no issues or complaints today about his plantar foot wound. 9/15; patient presents for follow-up. He reports improvement in pain to the back of his right leg. He does not report any drainage. He had an ultrasound completed on 9/9 that showed a superficial fluid  collection concerning for abscess. We attempted to call patient with results with no answer. We did put an urgent referral to general surgery and he reports he has not received a call. He currently denies systemic signs of infection. He reports no issues to his left plantar foot wound. 9/27; patient presents for follow-up. He has no issues or complaints today. He has had no issues with the previous wound to the back of his right leg. He has not made an appointment to see general surgery. He had Apligraf placed at last clinic visit to the plantar foot wound. He currently denies signs of infection. 10/7; patient presents for follow-up. Upon entering the room there is vomit throughout the entire floor. Patient states he does not feel well. I recommended he go to urgent care. I recommended following up next week for wound care. 10/10; patient presents for follow-up. He came last week for his follow-up however he was ill and started vomiting in the exam room. Today he states he feels better however he continues to have vomiting episodes. He states he attempted to go to urgent care/the ED but could not find a parking spot. He subsequently went home. He has no issues or complaints today regarding the foot wound. He never followed up with general surgery for the abscess identified on ultrasound to the back of his right knee. 10/20; patient presents for follow-up. He has no issues or complaints today. Overall he feels  well today. 10/27; patient presents for follow-up. He has no issues or complaints today. 11/3; patient presents for follow-up. He has no issues or complaints today. He states he does not want to follow-up with orthopedics to reevaluate the abscess in the back of his right leg. He denies signs of infection. 11/15; patient presents for follow-up. He has no issues or complaints today. He had been using silver alginate to the left first met head wound. He denies signs of infection. 11/22; patient  presents for follow-up. He had Affinity placed at last clinic visit. He has no issues or complaints today. 11/29; patient presents for follow-up. He is in good spirits today. He has no issues or complaints today. 12/6; patient presents for follow-up. He has no issues or complaints today. 12/13; patient presents for follow-up. He denies signs of infection. He has no issues or complaints today. 12/20; patient presents for follow-up. He has no issues or complaints today. He denies signs of infection. 12/29; patient presents for follow-up. He states that a few days ago he has had reaccumulation of an abscess behind the right knee. He denies increased warmth or erythema to the area. He denies fever/chills or drainage. 1/6; patient presents for follow-up. He states he went to the [facility] emergency department to be evaluated for the abscess to his right posterior knee. He was started on clindamycin. They were unable to drain the abscess. Patient states that eventually the abscess ruptured. He reports improvement in his symptoms. He has no issues or complaints about his left foot wound. 1/17; patient presents for follow-up. He has no issues or complaints today. 1/24; patient presents for follow-up. He has no issues or complaints today. He denies signs of infection. 1/31; patient presents for follow-up. He has been approved for Affinity however has a 20% co-pay. Patient has declined further Affinity treatments due to this. He currently denies signs of infection. Patient History Information obtained from Patient. Family History Diabetes - Maternal Grandparents,Mother, Heart Disease - Father, Hypertension - Father, No family history of Cancer, Hereditary Spherocytosis, Kidney Disease, Lung Disease, Seizures, Stroke, Thyroid Problems, Tuberculosis. Social History Current every day smoker, Marital Status - Single, Alcohol Use - Never, Drug Use - No History, Caffeine Use - Never. Medical  History Eyes Denies history of Cataracts, Glaucoma, Optic Neuritis Ear/Nose/Mouth/Throat Denies history of Chronic sinus problems/congestion, Middle ear problems Hematologic/Lymphatic Patient has history of Anemia Denies history of Hemophilia, Human Immunodeficiency Virus, Lymphedema, Sickle Cell Disease Respiratory Denies history of Aspiration, Asthma, Chronic Obstructive Pulmonary Disease (COPD), Pneumothorax, Sleep Apnea, Tuberculosis Cardiovascular Patient has history of Hypertension, Peripheral Venous Disease Denies history of Angina, Arrhythmia, Congestive Heart Failure, Coronary Artery Disease, Deep Vein Thrombosis, Hypotension, Myocardial Infarction, Peripheral Arterial Disease, Phlebitis, Vasculitis Gastrointestinal Denies history of Cirrhosis , Colitis, Crohnoos, Hepatitis A, Hepatitis B, Hepatitis C Endocrine Patient has history of Type II Diabetes - oral meds Denies history of Type I Diabetes Genitourinary Denies history of End Stage Renal Disease Immunological Denies history of Lupus Erythematosus, Raynaudoos, Scleroderma Integumentary (Skin) Denies history of History of Burn Musculoskeletal Denies history of Gout, Rheumatoid Arthritis, Osteoarthritis, Osteomyelitis Neurologic Denies history of Dementia, Neuropathy, Quadriplegia, Paraplegia, Seizure Disorder Oncologic Denies history of Received Chemotherapy, Received Radiation Psychiatric Denies history of Anorexia/bulimia, Confinement Anxiety Hospitalization/Surgery History - fever 105, sepsis cellulitis right popliteal fossa 04/09/2021. Medical A Surgical History Notes nd Constitutional Symptoms (General Health) falls , leukocytosis Respiratory During waking hours and catches himself not breathing, "gasp for air". Saw Dr Wynonia Lawman, he couldn't find a reason  Objective Constitutional respirations regular, non-labored and within target range for patient.. Vitals Time Taken: 2:02 PM, Height: 77 in, Weight: 232 lbs,  BMI: 27.5, Temperature: 97.8 F, Pulse: 82 bpm, Respiratory Rate: 18 breaths/min, Blood Pressure: 173/97 mmHg, Capillary Blood Glucose: 103 mg/dl. General Notes: glucose per pt report this am Cardiovascular 2+ dorsalis pedis/posterior tibialis pulses. Psychiatric pleasant and cooperative. General Notes: Left leg: T the first met head there is an open wound with granulation tissue, circumferential callus and nonviable tissue. No signs of infection o Integumentary (Hair, Skin) Wound #2R status is Open. Original cause of wound was Blister. The date acquired was: 06/23/2020. The wound has been in treatment 70 weeks. The wound is located on the Left Metatarsal head first. The wound measures 1cm length x 0.5cm width x 0.3cm depth; 0.393cm^2 area and 0.118cm^3 volume. There is Fat Layer (Subcutaneous Tissue) exposed. There is undermining starting at 7:00 and ending at 12:00 with a maximum distance of 0.4cm. There is a medium amount of serosanguineous drainage noted. The wound margin is thickened. There is large (67-100%) red granulation within the wound bed. There is a small (1- 33%) amount of necrotic tissue within the wound bed. 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 We have not made much progress with skin substitutes. I am not sure he is able to offload this area due to having a right-sided prosthesis. I debrided nonviable tissue. No surrounding signs of infection. I would like to try blast X to address the bioburden. We will order this through our wound care supplier. For now I recommended using Hydrofera Blue daily. Due to the continued chronicity of the wound I recommended an x-ray of the foot. Procedures Wound #2R Pre-procedure diagnosis of Wound #2R is a  Diabetic Wound/Ulcer of the Lower Extremity located on the Left Metatarsal head first .Severity of Tissue Pre Debridement is: Fat layer exposed. There was a Excisional Skin/Subcutaneous Tissue Debridement with a total area of 0.5 sq cm performed by Kalman Shan, DO. With the following instrument(s): Curette to remove Non-Viable tissue/material. Material removed includes Subcutaneous Tissue and Slough and after achieving pain control using Lidocaine 4% Topical Solution. No specimens were taken. A time out was conducted at 14:35, prior to the start of the procedure. A Moderate amount of bleeding was controlled with Silver Nitrate. The procedure was tolerated well with a pain level of 0 throughout and a pain level of 0 following the procedure. Post Debridement Measurements: 1cm length x 0.5cm width x 0.3cm depth; 0.118cm^3 volume. Character of Wound/Ulcer Post Debridement is improved. Severity of Tissue Post Debridement is: Fat layer exposed. Post procedure Diagnosis Wound #2R: Same as Pre-Procedure Plan Follow-up Appointments: Return Appointment in 1 week. - with Dr. Heber [place] Edema Control - Lymphedema / SCD / Other: Elevate legs to the level of the heart or above for 30 minutes daily and/or when sitting, a frequency of: - throughout the day Avoid standing for long periods of time. Off-Loading: Other: - minimize walking on left foot related to pressure to wound. Aid in wound healing. Radiology ordered were: X-ray, Left foot - Left foot X-Ray related to diabetic non-healing ulcer looking for infection. CPT [id number]; ICD10 E11.621 WOUND #2R: - Metatarsal head first Wound Laterality: Left Cleanser: Soap and Water 1 x Per Week/15 Days Discharge Instructions: May shower  and wash wound with dial antibacterial soap and water prior to dressing change. Cleanser: Wound Cleanser (Generic) 1 x Per Week/15 Days Discharge Instructions: Cleanse the wound with wound cleanser prior to applying a clean dressing  using gauze sponges, not tissue or cotton balls. Peri-Wound Care: Sween Lotion (Moisturizing lotion) 1 x Per Week/15 Days Discharge Instructions: Apply moisturizing lotion to leg Prim Dressing: Hydrofera Blue Classic Foam, 4x4 in 1 x Per Week/15 Days ary Discharge Instructions: Moisten with saline prior to applying to wound bed Secondary Dressing: Woven Gauze Sponges 2x2 in (Generic) 1 x Per Week/15 Days Discharge Instructions: Apply over primary dressing as directed. Secondary Dressing: Optifoam Non-Adhesive Dressing, 4x4 in (Generic) 1 x Per Week/15 Days Discharge Instructions: cut to make foam donut to help offload Secured With: 32M [place] [facility] T ape, 2x2 (in/yd) (Generic) 1 x Per Week/15 Days Discharge Instructions: Secure dressing with tape as directed. 1. Hydrofera Blue 2. Aggressive offloading 3. Order blast X 4. In office sharp debridement 5. Follow-up in 1 week Electronic Signature(s) Signed: 12/23/2021 3:28:24 PM By: Kalman Shan DO Entered By: Kalman Shan on 12/23/2021 15:27:30 -------------------------------------------------------------------------------- HxROS Details Patient Name: Date of Service: [name], [name] 12/23/2021 2:00 PM Medical Record Number: [id number] Patient Account Number: [id number] Date of Birth/Sex: Treating RN: [birth year] (51 y.o. [name] [name] Primary Care Provider: [name] Other Clinician: Referring Provider: Treating Provider/Extender: Delano Metz in Treatment: 84 Information Obtained From Patient Constitutional Symptoms ([place]) Medical History: Past Medical History Notes: falls , leukocytosis Eyes Medical History: Negative for: Cataracts; Glaucoma; Optic Neuritis Ear/Nose/Mouth/Throat Medical History: Negative for: Chronic sinus problems/congestion; Middle ear problems Hematologic/Lymphatic Medical History: Positive for: Anemia Negative for: Hemophilia;  Human Immunodeficiency Virus; Lymphedema; Sickle Cell Disease Respiratory Medical History: Negative for: Aspiration; Asthma; Chronic Obstructive Pulmonary Disease (COPD); Pneumothorax; Sleep Apnea; Tuberculosis Past Medical History Notes: During waking hours and catches himself not breathing, "gasp for air". Saw Dr Wynonia Lawman, he couldn't find a reason Cardiovascular Medical History: Positive for: Hypertension; Peripheral Venous Disease Negative for: Angina; Arrhythmia; Congestive Heart Failure; Coronary Artery Disease; Deep Vein Thrombosis; Hypotension; Myocardial Infarction; Peripheral Arterial Disease; Phlebitis; Vasculitis Gastrointestinal Medical History: Negative for: Cirrhosis ; Colitis; Crohns; Hepatitis A; Hepatitis B; Hepatitis C Endocrine Medical History: Positive for: Type II Diabetes - oral meds Negative for: Type I Diabetes Time with diabetes: 2014 Treated with: Oral agents Blood sugar tested every day: Yes Tested : Blood sugar testing results: Breakfast: 91 Genitourinary Medical History: Negative for: End Stage Renal Disease Immunological Medical History: Negative for: Lupus Erythematosus; Raynauds; Scleroderma Integumentary (Skin) Medical History: Negative for: History of Burn Musculoskeletal Medical History: Negative for: Gout; Rheumatoid Arthritis; Osteoarthritis; Osteomyelitis Neurologic Medical History: Negative for: Dementia; Neuropathy; Quadriplegia; Paraplegia; Seizure Disorder Oncologic Medical History: Negative for: Received Chemotherapy; Received Radiation Psychiatric Medical History: Negative for: Anorexia/bulimia; Confinement Anxiety Immunizations Pneumococcal Vaccine: Received Pneumococcal Vaccination: No Implantable Devices No devices added Hospitalization / Surgery History Type of Hospitalization/Surgery fever 105, sepsis cellulitis right popliteal fossa 04/09/2021 Family and Social History Cancer: No; Diabetes: Yes - Maternal  Grandparents,Mother; Heart Disease: Yes - Father; Hereditary Spherocytosis: No; Hypertension: Yes - Father; Kidney Disease: No; Lung Disease: No; Seizures: No; Stroke: No; Thyroid Problems: No; Tuberculosis: No; Current every day smoker; Marital Status - Single; Alcohol Use: Never; Drug Use: No History; Caffeine Use: Never; Financial Concerns: No; Food, Clothing or Shelter Needs: No; Support System Lacking: No; Transportation Concerns: No Electronic Signature(s) Signed: 12/23/2021 3:28:24 PM By: Kalman Shan DO  Signed: 12/23/2021 5:08:46 PM By: Deon Pilling RN, BSN Entered By: Kalman Shan on 12/23/2021 15:20:29 -------------------------------------------------------------------------------- SuperBill Details Patient Name: Date of Service: [name], [name] 12/23/2021 Medical Record Number: [id number] Patient Account Number: [id number] Date of Birth/Sex: Treating RN: 04/05/1971 (51 y.o. 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 Facility Procedures CPT4 Code: [id number] Description: 33832 - DEB SUBQ TISSUE 20 SQ CM/< ICD-10 Diagnosis Description L97.522 Non-pressure chronic ulcer of other part of left foot with fat layer exposed E11.621 Type 2 diabetes mellitus with foot ulcer Modifier: Quantity: 1 Physician Procedures : CPT4 Code Description Modifier [id number] - WC PHYS SUBQ TISS 20 SQ CM ICD-10 Diagnosis Description L97.522 Non-pressure chronic ulcer of other part of left foot with fat layer exposed E11.621 Type 2 diabetes mellitus with foot  ulcer Quantity: 1 Electronic Signature(s) Signed: 12/23/2021 3:28:24 PM By: Kalman Shan DO Entered By: Kalman Shan on 12/23/2021 15:28:00

## 2021-12-29 ENCOUNTER — Ambulatory Visit (HOSPITAL_COMMUNITY)
Admission: RE | Admit: 2021-12-29 | Discharge: 2021-12-29 | Disposition: A | Discharge status: Home / Self Care | Payer: Medicare Other | Source: Ambulatory Visit | Attending: Internal Medicine | Admitting: Internal Medicine

## 2021-12-29 ENCOUNTER — Other Ambulatory Visit: Payer: Self-pay

## 2021-12-29 ENCOUNTER — Other Ambulatory Visit (HOSPITAL_COMMUNITY): Payer: Self-pay | Admitting: Internal Medicine

## 2021-12-29 DIAGNOSIS — M869 Osteomyelitis, unspecified: Secondary | ICD-10-CM | POA: Diagnosis present

## 2021-12-29 DIAGNOSIS — E1169 Type 2 diabetes mellitus with other specified complication: Secondary | ICD-10-CM | POA: Diagnosis present

## 2021-12-29 DIAGNOSIS — L97509 Non-pressure chronic ulcer of other part of unspecified foot with unspecified severity: Secondary | ICD-10-CM | POA: Insufficient documentation

## 2021-12-29 DIAGNOSIS — E11621 Type 2 diabetes mellitus with foot ulcer: Secondary | ICD-10-CM

## 2021-12-30 ENCOUNTER — Encounter (HOSPITAL_BASED_OUTPATIENT_CLINIC_OR_DEPARTMENT_OTHER): Payer: Medicare Other | Attending: Internal Medicine | Admitting: Internal Medicine

## 2021-12-30 DIAGNOSIS — L97522 Non-pressure chronic ulcer of other part of left foot with fat layer exposed: Secondary | ICD-10-CM | POA: Diagnosis present

## 2021-12-30 DIAGNOSIS — Z89511 Acquired absence of right leg below knee: Secondary | ICD-10-CM | POA: Diagnosis not present

## 2021-12-30 DIAGNOSIS — E11621 Type 2 diabetes mellitus with foot ulcer: Secondary | ICD-10-CM | POA: Diagnosis not present

## 2021-12-30 DIAGNOSIS — E1142 Type 2 diabetes mellitus with diabetic polyneuropathy: Secondary | ICD-10-CM | POA: Diagnosis not present

## 2021-12-30 NOTE — Progress Notes (Signed)
PURVIS, SIDLE ([id number]) Visit Report for 12/30/2021 Chief Complaint Document Details Patient Name: Date of Service: [name] [name] 12/30/2021 1:00 PM Medical Record Number: [id number] Patient Account Number: [id number] Date of Birth/Sex: Treating RN: 1971/01/04 (51 y.o. [name] Primary Care Provider: [name] Other Clinician: Referring Provider: Treating Provider/Extender: Delano Metz in Treatment: 77 Information Obtained from: Patient Chief Complaint patient is here for a review of the wound on his plantar left first metatarsal head and back of his right leg Electronic Signature(s) Signed: 12/30/2021 4:02:00 PM By: Kalman Shan DO Entered By: Kalman Shan on 12/30/2021 15:58:25 -------------------------------------------------------------------------------- HPI Details Patient Name: Date of Service: [name] [name], [name] 12/30/2021 1:00 PM Medical Record Number: [id number] Patient Account Number: [id number] Date of Birth/Sex: Treating RN: 01-Feb-1971 (51 y.o. [name] Primary Care Provider: [name] Other Clinician: Referring Provider: Treating Provider/Extender: Delano Metz in Treatment: 58 History of Present Illness HPI Description: 04/21/18 ADMISSION This is a 51 year old man who is a type II diabetic. In spite of fact his hemoglobin A1c is actually quite good 5.73 months ago he is at a really difficult time over the last 6-7 months. He developed a rapidly progressive infection in the right foot in November 2018 associated with osteomyelitis and necrotizing fasciitis. 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 The stump required and a revision on 11/24/17. The stump revision was advertised as being secondary to falls. I'm not sure the progressive history here however this area is actually closed over. The patient tells me over the same timeframe he has had wounds on the plantar aspect of  his left foot. In the ED saw Dr. Sharol Given in April of this year. 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 It is really not clear that this patient is been dressing this with anything. He came in with the clinic without any specific dressing on the wound areas using his own tennis shoe. He tells me that he only ambulates of course to do a pivot transfer. He does not have his prosthesis for the right leg as of yet and he blames Medicaid for this. He does however use the foot to push himself along in his wheelchair at home. The patient has not had formal arterial studies. ABI in our clinic on the left was 1.13. Patient's past medical history includes type 2 diabetes, fracture of the right fibula. I see that he was treated for abscesses on his right buttock and chest in 2016. I did not look at the microbiology of this. 04/28/18; patient comes back in the clinic today with the wound pretty much the same as when he came in here last week. Small opening lots of undermining relatively. He tells [place] that nothing is really been on this for 3 days in spite of the fact that we gave him enough to dress this easily especially such a small wound. He says he lives with his mother, she is not capable of assisting with this he is changing the dressings himself. 05/05/18; much better-looking wound today. Smaller. There is some undermining medially however that's only perhaps 2 mm. No surrounding erythema. We've been using silver collagen 05/12/18; small wound on the first metatarsal head. No undermining no surrounding erythema. We've been using silver collagen he has home health coming out to change 05/20/18 the wound on the first metatarsal head looks better. Covered in surface debris/callus nonviable tissue. Required  debridement but post debridement this looks quite good. We've been using silver collagen. He has home health 05/27/18; first metatarsal head wound continues to  improve. Just about completely closed. Still a lot of surface debridement callus. We've been using silver collagen 06/06/18; the first metatarsal wound is completely healed over. There is still a lot of callus. I gently removed some of this just to make sure that there was no open area and there is not. The patient mentions to me that his left leg has felt like "lead" for about a week READMISSION 08/16/2020 This is a 51 year old man with type 2 diabetes. He returns to clinic today with a 1 month history of a blister and callus over the first metatarsal head. This is in exactly the same area as when he was here in 2019. 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 He has standard running shoes on the left foot to match the area in his prosthesis. He has only been covering this area with a Band-Aid has not really been specifically dressing this. ABI in our clinic on the left was 1.16. This is essentially stable from the value in 2019 10/1 the patient has a linear wound over the left first metatarsal head. Again not a lot of callus thick skin around this that I removed with a #3 curette. This is not go deep to bone or muscle it does not look to be infected. 10/8 first metatarsal head. The wound looks like it is closing however this is going to be a difficult area to offload in the future. He has a size 15 shoe and he says his shoes are years old. The inserts in these current shoes are totally useless and I have advised him to replace these. He may need a new pair of shoes and I have he got original prosthesis at [facility] [place] I have asked him to go back there. 11/9; the patient has not been here in more than a month. Not sure he was healed when he was here the last time. I told him he would have to get new shoes and certainly offload the area over the left first metatarsal head. He has done nothing of the kind. He arrives back in clinic with the same old  new balance sneakers. A very large separating callus over the first metatarsal head on the left 11/16; he has purchased new shoes and has at least 2 insoles like I asked. The wound is measuring much smaller. He is using silver alginate he changes the dressing himself 11/30; wound is measurably smaller in length of about a half a centimeter. This is generally very little depth. We have been using silver alginate. He changes the dressing himself every second day. 12/14; wound is about the same size. Significant undermining medially relative to the size of the wound. We have been using silver alginate. There is not a way to offload this area as he walks with a prosthesis on the right leg 12/28; wound is about the same size. Again he has undermining medially. I am using polymen on this 12/02/2020; the wound looks smaller. Still a senescent edge. We have been using polymen 1/24; the wound is come down a few millimeters. Still a senescent edge. I have been using polymen 2/8; the wound is come down slightly. Still with slight undermining from 12-6 o'clock I have been using polymen partially to get offloading on this area which  is opposed by his prosthesis on the other leg. 2/22 quite open improvement he still has a comma shaped wound on the left first metatarsal head. Some depth. Using polymen 3/14; he still has the same problem of a comma shaped area over the left first metatarsal head. This seems to have had more depth today at 0.6 cm. We have been using polymen. The patient changes this himself every second day. I explored the idea of a total contact cast on the left leg however this is his driving foot. He was not enamored with the idea of not being able to drive and states that he does not have anybody else to get him around 3/28; again he comes in with a smaller looking wound however there is depth and undermining requiring debridement. We have been using Hydrofera Blue, changed to silver collagen  today. The patient is doing the dressing himself 4/11; patient presents today for follow-up of his left diabetic foot wound. He denies any issues in the past 2 weeks. He is currently using silver collagen every other day with dressing changes. He does not use diabetic shoes or special inserts to his sneakers. He does not use felt pads for offloading. 4/19; patient presents for his 1 week follow-up. He denies any issues. He reports using Hydrofera Blue and has not been using silver collagen for dressing changes. He reports minimal drainage to the wound. He overall feels well. 5/3; patient presents for 2-week follow-up. He has been using silver collagen without issues. He has no complaints today and states he overall feels well. 5/17; patient presents for 2-week follow-up. He states that the sleeve is rubbing up against the back of his right leg and causing a wound. He states this happened a week ago and has not been dressing the wound. The plantar wound is stable and he has been using collagen every other day. 5/24; Patient was had a recent hospitalization for cellulitis of his right popliteal fossa. He was admitted and started on IV antibiotics. He does not recall the plantar wound dressed while inpatient. He feels a lot better today. He was discharged on 5/21 on oral antibiotics and has not picked this up until today. He reports using collagen to the plantar wound since discharge. He is keeping the right posterior knee wound covered. He is getting a new sleeve for his prostatic on 5/26. He currently denies signs of infection. 6/1; patient presents for 1 week follow-up. He is still on doxycycline. He has not been dressing the back of the right knee wound. He states he is receiving his new prosthetic sleeve tomorrow. He states he is using silver alginate to the plantar foot wound. He currently denies signs of infection. He states he has not been offloading the left foot wound 6/7; patient presents for  1 week follow-up. He completes his last dose of doxycycline today. He finally received his prosthetic sleeve for the right side and he states it feels much better. He has been using antibiotic ointment to the back of that right knee wound. He has been using silver alginate to the plantar foot wound. He currently denies signs of infection. 6/21; patient presents for 2-week follow-up. He reports no issues with his prosthetic sleeve. He has been using antibiotic ointment to the back of the right knee. He has been using silver alginate to the plantar foot wound. He has no issues or complaints today. He denies signs of infection. 7/5; patient presents for 2-week follow-up. He reports no issues  or complaints today. He continues to use antibiotic ointment to the right posterior knee wound and silver alginate to the left plantar foot wound. He denies signs of infection. 7/19; patient presents for 2-week follow-up. He continues to use antibiotic ointment to the right posterior knee and silver alginate to the left plantar foot wound. He has no issues or complaints today. He denies signs of infection. 8/4; patient presents for 2-week follow-up. He has no issues or complaints today. He denies signs of infection. 8/18; patient presents for 2-week follow-up. He has no issues or concerns today. He has been approved for Apligraf and he would like to have this placed today. He denies signs of infection and reports that his right posterior knee wound is closed. 8/25; patient presents for 1 week follow-up. He had no issues with his first Apligraf placement last week. He did however develop an abscess to the back of his right knee. He reports tenderness to this area. He denies systemic signs of infection. 9/1; patient presents for 1 week follow-up. He has had no issues with his plantar foot wound. The Apligraf has stayed in place over the past week. A wound culture was done at last clinic visit and a new antibiotic was  sent to the pharmacy. Patient has not received this yet. He reports some mild tenderness to the back of his right knee. He denies systemic signs of infection. 9/8; patient presents for 1 week follow-up. He states he took Keflex and had diarrhea as a side effect. He was able to complete the course. He reports continued tenderness to the right posterior knee wound. He has some drainage still. He denies increased warmth or erythema to the area. He denies fever/chills or nausea/vomiting. He has no issues or complaints today about his plantar foot wound. 9/15; patient presents for follow-up. He reports improvement in pain to the back of his right leg. He does not report any drainage. He had an ultrasound completed on 9/9 that showed a superficial fluid collection concerning for abscess. We attempted to call patient with results with no answer. We did put an urgent referral to general surgery and he reports he has not received a call. He currently denies systemic signs of infection. He reports no issues to his left plantar foot wound. 9/27; patient presents for follow-up. He has no issues or complaints today. He has had no issues with the previous wound to the back of his right leg. He has not made an appointment to see general surgery. He had Apligraf placed at last clinic visit to the plantar foot wound. He currently denies signs of infection. 10/7; patient presents for follow-up. Upon entering the room there is vomit throughout the entire floor. Patient states he does not feel well. I recommended he go to urgent care. I recommended following up next week for wound care. 10/10; patient presents for follow-up. He came last week for his follow-up however he was ill and started vomiting in the exam room. Today he states he feels better however he continues to have vomiting episodes. He states he attempted to go to urgent care/the ED but could not find a parking spot. He subsequently went home. He has no  issues or complaints today regarding the foot wound. He never followed up with general surgery for the abscess identified on ultrasound to the back of his right knee. 10/20; patient presents for follow-up. He has no issues or complaints today. Overall he feels well today. 10/27; patient presents for follow-up. He  has no issues or complaints today. 11/3; patient presents for follow-up. He has no issues or complaints today. He states he does not want to follow-up with orthopedics to reevaluate the abscess in the back of his right leg. He denies signs of infection. 11/15; patient presents for follow-up. He has no issues or complaints today. He had been using silver alginate to the left first met head wound. He denies signs of infection. 11/22; patient presents for follow-up. He had Affinity placed at last clinic visit. He has no issues or complaints today. 11/29; patient presents for follow-up. He is in good spirits today. He has no issues or complaints today. 12/6; patient presents for follow-up. He has no issues or complaints today. 12/13; patient presents for follow-up. He denies signs of infection. He has no issues or complaints today. 12/20; patient presents for follow-up. He has no issues or complaints today. He denies signs of infection. 12/29; patient presents for follow-up. He states that a few days ago he has had reaccumulation of an abscess behind the right knee. He denies increased warmth or erythema to the area. He denies fever/chills or drainage. 1/6; patient presents for follow-up. He states he went to the [facility] emergency department to be evaluated for the abscess to his right posterior knee. He was started on clindamycin. They were unable to drain the abscess. Patient states that eventually the abscess ruptured. He reports improvement in his symptoms. He has no issues or complaints about his left foot wound. 1/17; patient presents for follow-up. He has no issues or complaints  today. 1/24; patient presents for follow-up. He has no issues or complaints today. He denies signs of infection. 1/31; patient presents for follow-up. He has been approved for Affinity however has a 20% co-pay. Patient has declined further Affinity treatments due to this. He currently denies signs of infection. 2/7; patient presents for follow-up. He has no issues or complaints today. He denies signs of infection. He has been doing Hydrofera Blue dressing changes. Electronic Signature(s) Signed: 12/30/2021 4:02:00 PM By: Kalman Shan DO Entered By: Kalman Shan on 12/30/2021 15:59:04 -------------------------------------------------------------------------------- Physical Exam Details Patient Name: Date of Service: [name] [name], [name] 12/30/2021 1:00 PM Medical Record Number: [id number] Patient Account Number: [id number] Date of Birth/Sex: Treating RN: [birth year] (51 y.o. [name] Primary Care Provider: [name] Other Clinician: Referring Provider: Treating Provider/Extender: Delano Metz in Treatment: 23 Constitutional respirations regular, non-labored and within target range for patient.. Cardiovascular 2+ dorsalis pedis/posterior tibialis pulses. Psychiatric pleasant and cooperative. Notes Left leg: T the first met head there is an open wound with granulation tissue and circumferential callus. No signs of infection. o Electronic Signature(s) Signed: 12/30/2021 4:02:00 PM By: Kalman Shan DO Signed: 12/30/2021 4:02:00 PM By: Kalman Shan DO Entered By: Kalman Shan on 12/30/2021 15:59:54 -------------------------------------------------------------------------------- Physician Orders Details Patient Name: Date of Service: [name] [name], [name] 12/30/2021 1:00 PM Medical Record Number: [id number] Patient Account Number: [id number] Date of Birth/Sex: Treating RN: 28-Aug-1971 (51 y.o. [name] [name] Primary Care Provider:  [name] Other Clinician: Referring Provider: Treating Provider/Extender: Delano Metz in Treatment: 35 Verbal / Phone Orders: No Diagnosis Coding ICD-10 Coding Code Description [phone] Non-pressure chronic ulcer of other part of left foot with fat layer exposed E11.621 Type 2 diabetes mellitus with foot ulcer E11.42 Type 2 diabetes mellitus with diabetic polyneuropathy Z89.511 Acquired absence of right leg below knee Follow-up Appointments ppointment in 1 week. - with Dr. Heber Duran Return A  Edema Control - Lymphedema / SCD / Other Elevate legs to the level of the heart or above for 30 minutes daily and/or when sitting, a frequency of: - throughout the day Avoid standing for long periods of time. Off-Loading Other: - minimize walking on left foot related to pressure to wound. Aid in wound healing. Wound Treatment Wound #2R - Metatarsal head first Wound Laterality: Left Cleanser: Soap and Water 1 x Per Week/15 Days Discharge Instructions: May shower and wash wound with dial antibacterial soap and water prior to dressing change. Cleanser: Wound Cleanser (Generic) 1 x Per Week/15 Days Discharge Instructions: Cleanse the wound with wound cleanser prior to applying a clean dressing using gauze sponges, not tissue or cotton balls. Peri-Wound Care: 40% Urea Cream 1 x Per Week/15 Days Discharge Instructions: Apply to callous around wound Topical: BlastX 1 x Per Week/15 Days Prim Dressing: FIBRACOL Plus Dressing, 2x2 in (collagen) 1 x Per Week/15 Days ary Discharge Instructions: Moisten collagen with saline or hydrogel Secondary Dressing: Woven Gauze Sponges 2x2 in (Generic) 1 x Per Week/15 Days Discharge Instructions: Apply over primary dressing as directed. Secondary Dressing: Optifoam Non-Adhesive Dressing, 4x4 in (Generic) 1 x Per Week/15 Days Discharge Instructions: cut to make foam donut to help offload Secured With: 9M [employer]  Tape, 2x2 (in/yd) (Generic) 1 x Per Week/15 Days Discharge Instructions: Secure dressing with tape as directed. Electronic Signature(s) Signed: 12/30/2021 4:02:00 PM By: Kalman Shan DO Entered By: Kalman Shan on 12/30/2021 16:00:15 -------------------------------------------------------------------------------- Problem List Details Patient Name: Date of Service: [name] [name], [name] 12/30/2021 1:00 PM Medical Record Number: [id number] Patient Account Number: [id number] Date of Birth/Sex: Treating RN: March 06, 1971 (51 y.o. [name] [name] Primary Care Provider: [name] Other Clinician: Referring Provider: Treating Provider/Extender: Delano Metz in Treatment: 54 Active Problems ICD-10 Encounter Code Description Active Date MDM Diagnosis [phone] Non-pressure chronic ulcer of other part of left foot with fat layer exposed 08/16/2020 No Yes E11.621 Type 2 diabetes mellitus with foot ulcer 08/16/2020 No Yes E11.42 Type 2 diabetes mellitus with diabetic polyneuropathy 08/16/2020 No Yes Z89.511 Acquired absence of right leg below knee 08/16/2020 No Yes Inactive Problems Resolved Problems ICD-10 Code Description Active Date Resolved Date S81.801D Unspecified open wound, right lower leg, subsequent encounter 04/08/2021 04/08/2021 Electronic Signature(s) Signed: 12/30/2021 4:02:00 PM By: Kalman Shan DO Previous Signature: 12/30/2021 1:23:21 PM Version By: Lorrin Jackson Entered By: Kalman Shan on 12/30/2021 15:58:00 -------------------------------------------------------------------------------- Progress Note Details Patient Name: Date of Service: [name] [name], [name] 12/30/2021 1:00 PM Medical Record Number: [id number] Patient Account Number: [id number] Date of Birth/Sex: Treating RN: [birth year] (51 y.o. [name] Primary Care Provider: [name] Other Clinician: Referring Provider: Treating Provider/Extender: Delano Metz in Treatment: 66 Subjective Chief Complaint Information obtained from Patient patient is here for a review of the wound on his plantar left first metatarsal head and back of his right leg History of Present Illness (HPI) 04/21/18 ADMISSION This is a 51 year old man who is a type II diabetic. In spite of fact his hemoglobin A1c is actually quite good 5.73 months ago he is at a really difficult time over the last 6-7 months. He developed a rapidly progressive infection in the right foot in November 2018 associated with osteomyelitis and necrotizing fasciitis. 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 The stump required and a revision on 11/24/17. The stump revision was advertised as being secondary to falls. I'm not sure the progressive  history here however this area is actually closed over. The patient tells me over the same timeframe he has had wounds on the plantar aspect of his left foot. In the ED saw Dr. Sharol Given in April of this year. 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 It is really not clear that this patient is been dressing this with anything. He came in with the clinic without any specific dressing on the wound areas using his own tennis shoe. He tells me that he only ambulates of course to do a pivot transfer. He does not have his prosthesis for the right leg as of yet and he blames Medicaid for this. He does however use the foot to push himself along in his wheelchair at home. The patient has not had formal arterial studies. ABI in our clinic on the left was 1.13. Patient's past medical history includes type 2 diabetes, fracture of the right fibula. I see that he was treated for abscesses on his right buttock and chest in 2016. I did not look at the microbiology of this. 04/28/18; patient comes back in the clinic today with the wound pretty much the same as when he came in here last week. Small opening lots of  undermining relatively. He tells [place] that nothing is really been on this for 3 days in spite of the fact that we gave him enough to dress this easily especially such a small wound. He says he lives with his mother, she is not capable of assisting with this he is changing the dressings himself. 05/05/18; much better-looking wound today. Smaller. There is some undermining medially however that's only perhaps 2 mm. No surrounding erythema. We've been using silver collagen 05/12/18; small wound on the first metatarsal head. No undermining no surrounding erythema. We've been using silver collagen he has home health coming out to change 05/20/18 the wound on the first metatarsal head looks better. Covered in surface debris/callus nonviable tissue. Required debridement but post debridement this looks quite good. We've been using silver collagen. He has home health 05/27/18; first metatarsal head wound continues to improve. Just about completely closed. Still a lot of surface debridement callus. We've been using silver collagen 06/06/18; the first metatarsal wound is completely healed over. There is still a lot of callus. I gently removed some of this just to make sure that there was no open area and there is not. The patient mentions to me that his left leg has felt like "lead" for about a week READMISSION 08/16/2020 This is a 51 year old man with type 2 diabetes. He returns to clinic today with a 1 month history of a blister and callus over the first metatarsal head. This is in exactly the same area as when he was here in 2019. 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 He has standard running shoes on the left foot to match the area in his prosthesis. He has only been covering this area with a Band-Aid has not really been specifically dressing this. ABI in our clinic on the left was 1.16. This is essentially stable from the value in 2019 10/1 the patient has a  linear wound over the left first metatarsal head. Again not a lot of callus thick skin around this that I removed with a #3 curette. This is not go deep to bone or muscle it does not look to be infected. 10/8 first metatarsal  head. The wound looks like it is closing however this is going to be a difficult area to offload in the future. He has a size 15 shoe and he says his shoes are years old. The inserts in these current shoes are totally useless and I have advised him to replace these. He may need a new pair of shoes and I have he got original prosthesis at [facility] [place] I have asked him to go back there. 11/9; the patient has not been here in more than a month. Not sure he was healed when he was here the last time. I told him he would have to get new shoes and certainly offload the area over the left first metatarsal head. He has done nothing of the kind. He arrives back in clinic with the same old new balance sneakers. A very large separating callus over the first metatarsal head on the left 11/16; he has purchased new shoes and has at least 2 insoles like I asked. The wound is measuring much smaller. He is using silver alginate he changes the dressing himself 11/30; wound is measurably smaller in length of about a half a centimeter. This is generally very little depth. We have been using silver alginate. He changes the dressing himself every second day. 12/14; wound is about the same size. Significant undermining medially relative to the size of the wound. We have been using silver alginate. There is not a way to offload this area as he walks with a prosthesis on the right leg 12/28; wound is about the same size. Again he has undermining medially. I am using polymen on this 12/02/2020; the wound looks smaller. Still a senescent edge. We have been using polymen 1/24; the wound is come down a few millimeters. Still a senescent edge. I have been using polymen 2/8; the wound is come down  slightly. Still with slight undermining from 12-6 o'clock I have been using polymen partially to get offloading on this area which is opposed by his prosthesis on the other leg. 2/22 quite open improvement he still has a comma shaped wound on the left first metatarsal head. Some depth. Using polymen 3/14; he still has the same problem of a comma shaped area over the left first metatarsal head. This seems to have had more depth today at 0.6 cm. We have been using polymen. The patient changes this himself every second day. I explored the idea of a total contact cast on the left leg however this is his driving foot. He was not enamored with the idea of not being able to drive and states that he does not have anybody else to get him around 3/28; again he comes in with a smaller looking wound however there is depth and undermining requiring debridement. We have been using Hydrofera Blue, changed to silver collagen today. The patient is doing the dressing himself 4/11; patient presents today for follow-up of his left diabetic foot wound. He denies any issues in the past 2 weeks. He is currently using silver collagen every other day with dressing changes. He does not use diabetic shoes or special inserts to his sneakers. He does not use felt pads for offloading. 4/19; patient presents for his 1 week follow-up. He denies any issues. He reports using Hydrofera Blue and has not been using silver collagen for dressing changes. He reports minimal drainage to the wound. He overall feels well. 5/3; patient presents for 2-week follow-up. He has been using silver collagen without issues.  He has no complaints today and states he overall feels well. 5/17; patient presents for 2-week follow-up. He states that the sleeve is rubbing up against the back of his right leg and causing a wound. He states this happened a week ago and has not been dressing the wound. The plantar wound is stable and he has been using collagen  every other day. 5/24; Patient was had a recent hospitalization for cellulitis of his right popliteal fossa. He was admitted and started on IV antibiotics. He does not recall the plantar wound dressed while inpatient. He feels a lot better today. He was discharged on 5/21 on oral antibiotics and has not picked this up until today. He reports using collagen to the plantar wound since discharge. He is keeping the right posterior knee wound covered. He is getting a new sleeve for his prostatic on 5/26. He currently denies signs of infection. 6/1; patient presents for 1 week follow-up. He is still on doxycycline. He has not been dressing the back of the right knee wound. He states he is receiving his new prosthetic sleeve tomorrow. He states he is using silver alginate to the plantar foot wound. He currently denies signs of infection. He states he has not been offloading the left foot wound 6/7; patient presents for 1 week follow-up. He completes his last dose of doxycycline today. He finally received his prosthetic sleeve for the right side and he states it feels much better. He has been using antibiotic ointment to the back of that right knee wound. He has been using silver alginate to the plantar foot wound. He currently denies signs of infection. 6/21; patient presents for 2-week follow-up. He reports no issues with his prosthetic sleeve. He has been using antibiotic ointment to the back of the right knee. He has been using silver alginate to the plantar foot wound. He has no issues or complaints today. He denies signs of infection. 7/5; patient presents for 2-week follow-up. He reports no issues or complaints today. He continues to use antibiotic ointment to the right posterior knee wound and silver alginate to the left plantar foot wound. He denies signs of infection. 7/19; patient presents for 2-week follow-up. He continues to use antibiotic ointment to the right posterior knee and silver alginate  to the left plantar foot wound. He has no issues or complaints today. He denies signs of infection. 8/4; patient presents for 2-week follow-up. He has no issues or complaints today. He denies signs of infection. 8/18; patient presents for 2-week follow-up. He has no issues or concerns today. He has been approved for Apligraf and he would like to have this placed today. He denies signs of infection and reports that his right posterior knee wound is closed. 8/25; patient presents for 1 week follow-up. He had no issues with his first Apligraf placement last week. He did however develop an abscess to the back of his right knee. He reports tenderness to this area. He denies systemic signs of infection. 9/1; patient presents for 1 week follow-up. He has had no issues with his plantar foot wound. The Apligraf has stayed in place over the past week. A wound culture was done at last clinic visit and a new antibiotic was sent to the pharmacy. Patient has not received this yet. He reports some mild tenderness to the back of his right knee. He denies systemic signs of infection. 9/8; patient presents for 1 week follow-up. He states he took Keflex and had diarrhea as a side  effect. He was able to complete the course. He reports continued tenderness to the right posterior knee wound. He has some drainage still. He denies increased warmth or erythema to the area. He denies fever/chills or nausea/vomiting. He has no issues or complaints today about his plantar foot wound. 9/15; patient presents for follow-up. He reports improvement in pain to the back of his right leg. He does not report any drainage. He had an ultrasound completed on 9/9 that showed a superficial fluid collection concerning for abscess. We attempted to call patient with results with no answer. We did put an urgent referral to general surgery and he reports he has not received a call. He currently denies systemic signs of infection. He reports no  issues to his left plantar foot wound. 9/27; patient presents for follow-up. He has no issues or complaints today. He has had no issues with the previous wound to the back of his right leg. He has not made an appointment to see general surgery. He had Apligraf placed at last clinic visit to the plantar foot wound. He currently denies signs of infection. 10/7; patient presents for follow-up. Upon entering the room there is vomit throughout the entire floor. Patient states he does not feel well. I recommended he go to urgent care. I recommended following up next week for wound care. 10/10; patient presents for follow-up. He came last week for his follow-up however he was ill and started vomiting in the exam room. Today he states he feels better however he continues to have vomiting episodes. He states he attempted to go to urgent care/the ED but could not find a parking spot. He subsequently went home. He has no issues or complaints today regarding the foot wound. He never followed up with general surgery for the abscess identified on ultrasound to the back of his right knee. 10/20; patient presents for follow-up. He has no issues or complaints today. Overall he feels well today. 10/27; patient presents for follow-up. He has no issues or complaints today. 11/3; patient presents for follow-up. He has no issues or complaints today. He states he does not want to follow-up with orthopedics to reevaluate the abscess in the back of his right leg. He denies signs of infection. 11/15; patient presents for follow-up. He has no issues or complaints today. He had been using silver alginate to the left first met head wound. He denies signs of infection. 11/22; patient presents for follow-up. He had Affinity placed at last clinic visit. He has no issues or complaints today. 11/29; patient presents for follow-up. He is in good spirits today. He has no issues or complaints today. 12/6; patient presents for  follow-up. He has no issues or complaints today. 12/13; patient presents for follow-up. He denies signs of infection. He has no issues or complaints today. 12/20; patient presents for follow-up. He has no issues or complaints today. He denies signs of infection. 12/29; patient presents for follow-up. He states that a few days ago he has had reaccumulation of an abscess behind the right knee. He denies increased warmth or erythema to the area. He denies fever/chills or drainage. 1/6; patient presents for follow-up. He states he went to the [facility] emergency department to be evaluated for the abscess to his right posterior knee. He was started on clindamycin. They were unable to drain the abscess. Patient states that eventually the abscess ruptured. He reports improvement in his symptoms. He has no issues or complaints about his left foot wound. 1/17; patient  presents for follow-up. He has no issues or complaints today. 1/24; patient presents for follow-up. He has no issues or complaints today. He denies signs of infection. 1/31; patient presents for follow-up. He has been approved for Affinity however has a 20% co-pay. Patient has declined further Affinity treatments due to this. He currently denies signs of infection. 2/7; patient presents for follow-up. He has no issues or complaints today. He denies signs of infection. He has been doing Hydrofera Blue dressing changes. Patient History Information obtained from Patient. Family History Diabetes - Maternal Grandparents,Mother, Heart Disease - Father, Hypertension - Father, No family history of Cancer, Hereditary Spherocytosis, Kidney Disease, Lung Disease, Seizures, Stroke, Thyroid Problems, Tuberculosis. Social History Current every day smoker, Marital Status - Single, Alcohol Use - Never, Drug Use - No History, Caffeine Use - Never. Medical History Eyes Denies history of Cataracts, Glaucoma, Optic Neuritis Ear/Nose/Mouth/Throat Denies  history of Chronic sinus problems/congestion, Middle ear problems Hematologic/Lymphatic Patient has history of Anemia Denies history of Hemophilia, Human Immunodeficiency Virus, Lymphedema, Sickle Cell Disease Respiratory Denies history of Aspiration, Asthma, Chronic Obstructive Pulmonary Disease (COPD), Pneumothorax, Sleep Apnea, Tuberculosis Cardiovascular Patient has history of Hypertension, Peripheral Venous Disease Denies history of Angina, Arrhythmia, Congestive Heart Failure, Coronary Artery Disease, Deep Vein Thrombosis, Hypotension, Myocardial Infarction, Peripheral Arterial Disease, Phlebitis, Vasculitis Gastrointestinal Denies history of Cirrhosis , Colitis, Crohnoos, Hepatitis A, Hepatitis B, Hepatitis C Endocrine Patient has history of Type II Diabetes - oral meds Denies history of Type I Diabetes Genitourinary Denies history of End Stage Renal Disease Immunological Denies history of Lupus Erythematosus, Raynaudoos, Scleroderma Integumentary (Skin) Denies history of History of Burn Musculoskeletal Denies history of Gout, Rheumatoid Arthritis, Osteoarthritis, Osteomyelitis Neurologic Denies history of Dementia, Neuropathy, Quadriplegia, Paraplegia, Seizure Disorder Oncologic Denies history of Received Chemotherapy, Received Radiation Psychiatric Denies history of Anorexia/bulimia, Confinement Anxiety Hospitalization/Surgery History - fever 105, sepsis cellulitis right popliteal fossa 04/09/2021. Medical A Surgical History Notes [name] Constitutional Symptoms (General Health) falls , leukocytosis Respiratory During waking hours and catches himself not breathing, "gasp for air". Saw Dr Wynonia Lawman, he couldn't find a reason Objective Constitutional respirations regular, non-labored and within target range for patient.. Vitals Time Taken: 1:20 PM, Height: 77 in, Weight: 232 lbs, BMI: 27.5, Temperature: 97.7 F, Pulse: 85 bpm, Respiratory Rate: 17 breaths/min, Blood  Pressure: 151/81 mmHg, Capillary Blood Glucose: 154 mg/dl. Cardiovascular 2+ dorsalis pedis/posterior tibialis pulses. Psychiatric pleasant and cooperative. General Notes: Left leg: T the first met head there is an open wound with granulation tissue and circumferential callus. No signs of infection. o Integumentary (Hair, Skin) Wound #2R status is Open. Original cause of wound was Blister. The date acquired was: 06/23/2020. The wound has been in treatment 71 weeks. The wound is located on the Left Metatarsal head first. The wound measures 1cm length x 0.5cm width x 0.3cm depth; 0.393cm^2 area and 0.118cm^3 volume. There is Fat Layer (Subcutaneous Tissue) exposed. There is a medium amount of serosanguineous drainage noted. The wound margin is thickened. There is large (67-100%) red granulation within the wound bed. There is a small (1-33%) amount of necrotic tissue within the wound bed. 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 We discussed using blast X with collagen and patient would like to try this option. This was placed in office today. We will order  this through our wound care supplier. Patient can continue doing this daily. I recommended continuing to aggressively offload the area. No signs of infection on exam. Plan Follow-up Appointments: Return Appointment in 1 week. - with Dr. Heber [facility] Edema Control - Lymphedema / SCD / Other: Elevate legs to the level of the heart or above for 30 minutes daily and/or when sitting, a frequency of: - throughout the day Avoid standing for long periods of time. Off-Loading: Other: - minimize walking on left foot related to pressure to wound. Aid in wound healing. WOUND #2R: - Metatarsal head first Wound Laterality:  Left Cleanser: Soap and Water 1 x Per Week/15 Days Discharge Instructions: May shower and wash wound with dial antibacterial soap and water prior to dressing change. Cleanser: Wound Cleanser (Generic) 1 x Per Week/15 Days Discharge Instructions: Cleanse the wound with wound cleanser prior to applying a clean dressing using gauze sponges, not tissue or cotton balls. Peri-Wound Care: 40% Urea Cream 1 x Per Week/15 Days Discharge Instructions: Apply to callous around wound Topical: BlastX 1 x Per Week/15 Days Prim Dressing: FIBRACOL Plus Dressing, 2x2 in (collagen) 1 x Per Week/15 Days ary Discharge Instructions: Moisten collagen with saline or hydrogel Secondary Dressing: Woven Gauze Sponges 2x2 in (Generic) 1 x Per Week/15 Days Discharge Instructions: Apply over primary dressing as directed. Secondary Dressing: Optifoam Non-Adhesive Dressing, 4x4 in (Generic) 1 x Per Week/15 Days Discharge Instructions: cut to make foam donut to help offload Secured With: 50M [place] [facility] T ape, 2x2 (in/yd) (Generic) 1 x Per Week/15 Days Discharge Instructions: Secure dressing with tape as directed. 1. Blast X with collagen 2. Aggressive offloading 3. Follow-up in 1 week Electronic Signature(s) Signed: 12/30/2021 4:02:00 PM By: Kalman Shan DO Entered By: Kalman Shan on 12/30/2021 16:01:18 -------------------------------------------------------------------------------- [facility] Details Patient Name: Date of Service: [name] [name], [name] 12/30/2021 1:00 PM Medical Record Number: [id number] Patient Account Number: [id number] Date of Birth/Sex: Treating RN: 10-Apr-1971 (51 y.o. [name] Primary Care Provider: [name] Other Clinician: Referring Provider: Treating Provider/Extender: Delano Metz in Treatment: 5 Information Obtained From Patient Constitutional Symptoms (General Health) Medical History: Past Medical History Notes: falls ,  leukocytosis Eyes Medical History: Negative for: Cataracts; Glaucoma; Optic Neuritis Ear/Nose/Mouth/Throat Medical History: Negative for: Chronic sinus problems/congestion; Middle ear problems Hematologic/Lymphatic Medical History: Positive for: Anemia Negative for: Hemophilia; Human Immunodeficiency Virus; Lymphedema; Sickle Cell Disease Respiratory Medical History: Negative for: Aspiration; Asthma; Chronic Obstructive Pulmonary Disease (COPD); Pneumothorax; Sleep Apnea; Tuberculosis Past Medical History Notes: During waking hours and catches himself not breathing, "gasp for air". Saw Dr Wynonia Lawman, he couldn't find a reason Cardiovascular Medical History: Positive for: Hypertension; Peripheral Venous Disease Negative for: Angina; Arrhythmia; Congestive Heart Failure; Coronary Artery Disease; Deep Vein Thrombosis; Hypotension; Myocardial Infarction; Peripheral Arterial Disease; Phlebitis; Vasculitis Gastrointestinal Medical History: Negative for: Cirrhosis ; Colitis; Crohns; Hepatitis A; Hepatitis B; Hepatitis C Endocrine Medical History: Positive for: Type II Diabetes - oral meds Negative for: Type I Diabetes Time with diabetes: 2014 Treated with: Oral agents Blood sugar tested every day: Yes Tested : Blood sugar testing results: Breakfast: 91 Genitourinary Medical History: Negative for: End Stage Renal Disease Immunological Medical History: Negative for: Lupus Erythematosus; Raynauds; Scleroderma Integumentary (Skin) Medical History: Negative for: History of Burn Musculoskeletal Medical History: Negative for: Gout; Rheumatoid Arthritis; Osteoarthritis; Osteomyelitis Neurologic Medical History: Negative for: Dementia; Neuropathy; Quadriplegia; Paraplegia; Seizure Disorder Oncologic Medical History: Negative for: Received Chemotherapy; Received Radiation Psychiatric Medical History: Negative for: Anorexia/bulimia;  Confinement Anxiety Immunizations Pneumococcal  Vaccine: Received Pneumococcal Vaccination: No Implantable Devices No devices added Hospitalization / Surgery History Type of Hospitalization/Surgery fever 105, sepsis cellulitis right popliteal fossa 04/09/2021 Family and Social History Cancer: No; Diabetes: Yes - Maternal Grandparents,Mother; Heart Disease: Yes - Father; Hereditary Spherocytosis: No; Hypertension: Yes - Father; Kidney Disease: No; Lung Disease: No; Seizures: No; Stroke: No; Thyroid Problems: No; Tuberculosis: No; Current every day smoker; Marital Status - Single; Alcohol Use: Never; Drug Use: No History; Caffeine Use: Never; Financial Concerns: No; Food, Clothing or Shelter Needs: No; Support System Lacking: No; Transportation Concerns: No Electronic Signature(s) Signed: 12/30/2021 4:02:00 PM By: Kalman Shan DO Signed: 12/30/2021 6:07:56 PM By: Deon Pilling RN, BSN Entered By: Kalman Shan on 12/30/2021 15:59:14 -------------------------------------------------------------------------------- [place] Details Patient Name: Date of Service: [name], [name] 12/30/2021 Medical Record Number: [id number] Patient Account Number: [id number] Date of Birth/Sex: Treating RN: 13-Jul-1971 (51 y.o. [name] [name] Primary Care Provider: [name] Other Clinician: Referring Provider: Treating Provider/Extender: Delano Metz in Treatment: 64 Diagnosis Coding ICD-10 Codes Code Description [phone] Non-pressure chronic ulcer of other part of left foot with fat layer exposed E11.621 Type 2 diabetes mellitus with foot ulcer E11.42 Type 2 diabetes mellitus with diabetic polyneuropathy Z89.511 Acquired absence of right leg below knee Facility Procedures CPT4 Code: [id number] Description: 99213 - WOUND CARE VISIT-LEV 3 EST PT Modifier: Quantity: 1 Physician Procedures : CPT4 Code Description Modifier [id number] - WC PHYS LEVEL 3 - EST PT ICD-10 Diagnosis Description L97.522  Non-pressure chronic ulcer of other part of left foot with fat layer exposed E11.621 Type 2 diabetes mellitus with foot ulcer E11.42 Type 2  diabetes mellitus with diabetic polyneuropathy Z89.511 Acquired absence of right leg below knee Quantity: 1 Electronic Signature(s) Signed: 12/30/2021 4:02:00 PM By: Kalman Shan DO Entered By: Kalman Shan on 12/30/2021 16:01:34

## 2021-12-30 NOTE — Progress Notes (Signed)
CONALL, VANGORDER ([id number]) Visit Report for 12/30/2021 Arrival Information Details Patient Name: Date of Service: [name] [name] 12/30/2021 1:00 PM Medical Record Number: [id number] Patient Account Number: [id number] Date of Birth/Sex: Treating RN: 08/20/1971 (51 y.o. Mare Ferrari Primary Care Nazli Penn: [name] Other Clinician: Referring Lorin Hauck: Treating Batoul Limes/Extender: Delano Metz in Treatment: 64 Visit Information History Since Last Visit Added or deleted any medications: Yes Patient Arrived: Ambulatory Any new allergies or adverse reactions: No Arrival Time: 13:18 Had a fall or experienced change in No Accompanied By: Self activities of daily living that may affect Transfer Assistance: None risk of falls: Patient Identification Verified: Yes Signs or symptoms of abuse/neglect since last visito No Secondary Verification Process Completed: Yes Hospitalized since last visit: No Patient Requires Transmission-Based Precautions: No Has Dressing in Place as Prescribed: Yes Patient Has Alerts: No Pain Present Now: Yes Electronic Signature(s) Signed: 12/30/2021 4:27:37 PM By: Sharyn Creamer RN, BSN Entered By: Sharyn Creamer on 12/30/2021 13:19:55 -------------------------------------------------------------------------------- Clinic Level of Care Assessment Details Patient Name: Date of Service: [name] [name], [name] 12/30/2021 1:00 PM Medical Record Number: [id number] Patient Account Number: [id number] Date of Birth/Sex: Treating RN: 1971/08/01 (51 y.o. [name] Primary Care Bronco Mcgrory: [name] Other Clinician: Referring Mekhi Sonn: Treating Retina Bernardy/Extender: Delano Metz in Treatment: 90 Clinic Level of Care Assessment Items TOOL 4 Quantity Score X- 1 0 Use when only an EandM is performed on FOLLOW-UP visit ASSESSMENTS - Nursing Assessment / Reassessment X- 1 10 Reassessment of  Co-morbidities (includes updates in patient status) X- 1 5 Reassessment of Adherence to Treatment Plan ASSESSMENTS - Wound and Skin A ssessment / Reassessment X - Simple Wound Assessment / Reassessment - one wound 1 5 '[]'  - 0 Complex Wound Assessment / Reassessment - multiple wounds '[]'  - 0 Dermatologic / Skin Assessment (not related to wound area) ASSESSMENTS - Focused Assessment '[]'  - 0 Circumferential Edema Measurements - multi extremities '[]'  - 0 Nutritional Assessment / Counseling / Intervention '[]'  - 0 Lower Extremity Assessment (monofilament, tuning fork, pulses) '[]'  - 0 Peripheral Arterial Disease Assessment (using hand held doppler) ASSESSMENTS - Ostomy and/or Continence Assessment and Care '[]'  - 0 Incontinence Assessment and Management '[]'  - 0 Ostomy Care Assessment and Management (repouching, etc.) PROCESS - Coordination of Care '[]'  - 0 Simple Patient / Family Education for ongoing care X- 1 20 Complex (extensive) Patient / Family Education for ongoing care X- 1 10 Staff obtains Programmer, systems, Records, T Results / Process Orders est '[]'  - 0 Staff telephones HHA, Nursing Homes / Clarify orders / etc '[]'  - 0 Routine Transfer to another Facility (non-emergent condition) '[]'  - 0 Routine Hospital Admission (non-emergent condition) '[]'  - 0 New Admissions / Biomedical engineer / Ordering NPWT Apligraf, etc. , '[]'  - 0 Emergency Hospital Admission (emergent condition) '[]'  - 0 Simple Discharge Coordination '[]'  - 0 Complex (extensive) Discharge Coordination PROCESS - Special Needs '[]'  - 0 Pediatric / Minor Patient Management '[]'  - 0 Isolation Patient Management '[]'  - 0 Hearing / Language / Visual special needs '[]'  - 0 Assessment of Community assistance (transportation, D/C planning, etc.) '[]'  - 0 Additional assistance / Altered mentation '[]'  - 0 Support Surface(s) Assessment (bed, cushion, seat, etc.) INTERVENTIONS - Wound Cleansing / Measurement X - Simple Wound Cleansing - one  wound 1 5 '[]'  - 0 Complex Wound Cleansing - multiple wounds X- 1 5 Wound Imaging (photographs - any number of wounds) '[]'  - 0 Wound Tracing (instead of  photographs) X- 1 5 Simple Wound Measurement - one wound '[]'  - 0 Complex Wound Measurement - multiple wounds INTERVENTIONS - Wound Dressings X - Small Wound Dressing one or multiple wounds 1 10 '[]'  - 0 Medium Wound Dressing one or multiple wounds '[]'  - 0 Large Wound Dressing one or multiple wounds '[]'  - 0 Application of Medications - topical '[]'  - 0 Application of Medications - injection INTERVENTIONS - Miscellaneous '[]'  - 0 External ear exam '[]'  - 0 Specimen Collection (cultures, biopsies, blood, body fluids, etc.) '[]'  - 0 Specimen(s) / Culture(s) sent or taken to Lab for analysis '[]'  - 0 Patient Transfer (multiple staff / Civil Service fast streamer / Similar devices) '[]'  - 0 Simple Staple / Suture removal (25 or less) '[]'  - 0 Complex Staple / Suture removal (26 or more) '[]'  - 0 Hypo / Hyperglycemic Management (close monitor of Blood Glucose) '[]'  - 0 Ankle / Brachial Index (ABI) - do not check if billed separately X- 1 5 Vital Signs Has the patient been seen at the hospital within the last three years: Yes Total Score: 80 Level Of Care: New/Established - Level 3 Electronic Signature(s) Signed: 12/30/2021 5:56:15 PM By: Lorrin Jackson Entered By: Lorrin Jackson on 12/30/2021 13:51:53 -------------------------------------------------------------------------------- Encounter Discharge Information Details Patient Name: Date of Service: [name] [name], [name] 12/30/2021 1:00 PM Medical Record Number: [id number] Patient Account Number: [id number] Date of Birth/Sex: Treating RN: Sep 22, 1971 (51 y.o. [name] Primary Care Jerrell Hart: [name] Other Clinician: Referring Rhydian Baldi: Treating Rhealynn Myhre/Extender: Delano Metz in Treatment: 69 Encounter Discharge Information Items Discharge Condition:  Stable Ambulatory Status: Ambulatory Discharge Destination: Home Transportation: Private Auto Schedule Follow-up Appointment: Yes Clinical Summary of Care: Provided on 12/30/2021 Form Type Recipient Paper Patient Patient Electronic Signature(s) Signed: 12/30/2021 5:56:15 PM By: Lorrin Jackson Entered By: Lorrin Jackson on 12/30/2021 13:58:45 -------------------------------------------------------------------------------- Lower Extremity Assessment Details Patient Name: Date of Service: [name] [name], [name] 12/30/2021 1:00 PM Medical Record Number: [id number] Patient Account Number: [id number] Date of Birth/Sex: Treating RN: May 01, 1971 (51 y.o. Mare Ferrari Primary Care Railey Glad: [name] Other Clinician: Referring Vermell Madrid: Treating Anitria Andon/Extender: Delano Metz in Treatment: 61 Edema Assessment Assessed: [name]: No] [Right: No] Edema: [Left: Ye] [Right: s] Calf Left: Right: Point of Measurement: 50 cm From Medial Instep 45 cm Ankle Left: Right: Point of Measurement: 11 cm From Medial Instep 28.5 cm Vascular Assessment Pulses: Dorsalis Pedis Palpable: [Left:Yes] Electronic Signature(s) Signed: 12/30/2021 4:27:37 PM By: Sharyn Creamer RN, BSN Entered By: Sharyn Creamer on 12/30/2021 13:26:02 -------------------------------------------------------------------------------- Multi Wound Chart Details Patient Name: Date of Service: [name] [name], [name] 12/30/2021 1:00 PM Medical Record Number: [id number] Patient Account Number: [id number] Date of Birth/Sex: Treating RN: Mar 16, 1971 (51 y.o. [name] Primary Care Jered Heiny: [name] Other Clinician: Referring Manav Pierotti: Treating Cobain Morici/Extender: Delano Metz in Treatment: 70 Vital Signs Height(in): 77 Capillary Blood Glucose(mg/dl): 154 Weight(lbs): 232 Pulse(bpm): 85 Body Mass Index(BMI): 27.5 Blood Pressure(mmHg): 151/81 Temperature(F):  97.7 Respiratory Rate(breaths/min): 17 Photos: [N/A:N/A] Left Metatarsal head first N/A N/A Wound Location: Blister N/A N/A Wounding Event: Diabetic Wound/Ulcer of the Lower N/A N/A Primary Etiology: Extremity Anemia, Hypertension, Peripheral N/A N/A Comorbid History: Venous Disease, Type II Diabetes 06/23/2020 N/A N/A Date Acquired: 79 N/A N/A Weeks of Treatment: Open N/A N/A Wound Status: Yes N/A N/A Wound Recurrence: 1x0.5x0.3 N/A N/A Measurements L x W x D (cm) 0.393 N/A N/A A (cm) : rea 0.118 N/A N/A Volume (cm) : -1167.70% N/A N/A %  Reduction in A rea: -3833.30% N/A N/A % Reduction in Volume: Grade 2 N/A N/A Classification: Medium N/A N/A Exudate A mount: Serosanguineous N/A N/A Exudate Type: red, brown N/A N/A Exudate Color: Thickened N/A N/A Wound Margin: Large (67-100%) N/A N/A Granulation A mount: Red N/A N/A Granulation Quality: Small (1-33%) N/A N/A Necrotic A mount: Fat Layer (Subcutaneous Tissue): Yes N/A N/A Exposed Structures: Fascia: No Tendon: No Muscle: No Joint: No Bone: No Medium (34-66%) N/A N/A Epithelialization: Treatment Notes Wound #2R (Metatarsal head first) Wound Laterality: Left Cleanser Soap and Water Discharge Instruction: May shower and wash wound with dial antibacterial soap and water prior to dressing change. Wound Cleanser Discharge Instruction: Cleanse the wound with wound cleanser prior to applying a clean dressing using gauze sponges, not tissue or cotton balls. Peri-Wound Care 40% Urea Cream Discharge Instruction: Apply to callous around wound Topical BlastX Primary Dressing FIBRACOL Plus Dressing, 2x2 in (collagen) Discharge Instruction: Moisten collagen with saline or hydrogel Secondary Dressing Woven Gauze Sponges 2x2 in Discharge Instruction: Apply over primary dressing as directed. Optifoam Non-Adhesive Dressing, 4x4 in Discharge Instruction: cut to make foam donut to help offload Secured With [place] [facility] T ape, 2x2 (in/yd) Discharge Instruction: Secure dressing with tape as directed. Compression Wrap Compression Stockings Add-Ons Electronic Signature(s) Signed: 12/30/2021 4:02:00 PM By: Kalman Shan DO Signed: 12/30/2021 6:07:56 PM By: Deon Pilling RN, BSN Entered By: Kalman Shan on 12/30/2021 15:58:08 -------------------------------------------------------------------------------- Multi-Disciplinary Care Plan Details Patient Name: Date of Service: [name] [name], [name] 12/30/2021 1:00 PM Medical Record Number: [id number] Patient Account Number: [id number] Date of Birth/Sex: Treating RN: 1971-06-17 (51 y.o. [name] Primary Care Catherina Pates: [name] Other Clinician: Referring Kelicia Youtz: Treating Kissy Cielo/Extender: Delano Metz in Treatment: 63 Multidisciplinary Care Plan reviewed with physician Active Inactive Abuse / Safety / Falls / Self Care Management Nursing Diagnoses: History of Falls Goals: Patient/caregiver will verbalize/demonstrate measures taken to prevent injury and/or falls Date Initiated: 11/04/2021 Target Resolution Date: 01/06/2022 Goal Status: Active Interventions: Assess fall risk on admission and as needed Assess impairment of mobility on admission and as needed per policy Notes: Wound/Skin Impairment Nursing Diagnoses: Impaired tissue integrity Goals: Patient/caregiver will verbalize understanding of skin care regimen Date Initiated: 11/19/2020 Target Resolution Date: 01/06/2022 Goal Status: Active Ulcer/skin breakdown will have a volume reduction of 30% by week 4 Date Initiated: 08/16/2020 Date Inactivated: 03/03/2021 Target Resolution Date: 03/10/2021 Goal Status: Met Interventions: Provide education on ulcer and skin care Notes: 07/24/21: Wound care regimen ongoing. Electronic Signature(s) Signed: 12/30/2021 1:23:52 PM By: Lorrin Jackson Entered By: Lorrin Jackson on  12/30/2021 13:23:52 -------------------------------------------------------------------------------- Pain Assessment Details Patient Name: Date of Service: [name] [name], [name] 12/30/2021 1:00 PM Medical Record Number: [id number] Patient Account Number: [id number] Date of Birth/Sex: Treating RN: 02-Jul-1971 (51 y.o. Mare Ferrari Primary Care Ishaan Villamar: [name] Other Clinician: Referring Izak Anding: Treating Lasheba Stevens/Extender: Delano Metz in Treatment: 70 Active Problems Location of Pain Severity and Description of Pain Patient Has Paino Yes Site Locations Rate the pain. Current Pain Level: 5 Worst Pain Level: 8 Least Pain Level: 2 Tolerable Pain Level: 5 Character of Pain Describe the Pain: Throbbing Pain Management and Medication Current Pain Management: Electronic Signature(s) Signed: 12/30/2021 4:27:37 PM By: Sharyn Creamer RN, BSN Entered By: Sharyn Creamer on 12/30/2021 13:20:41 -------------------------------------------------------------------------------- Patient/Caregiver Education Details Patient Name: Date of Service: [name] [name], [name] [name] 2/7/2023andnbsp1:00 PM Medical Record Number: [id number] Patient Account Number: [id number] Date of Birth/Gender: Treating  RN: 20-Jul-1971 (51 y.o. [name] Primary Care Physician: [name] Other Clinician: Referring Physician: Treating Physician/Extender: Delano Metz in Treatment: 68 Education Assessment Education Provided To: Patient Education Topics Provided Wound/Skin Impairment: Methods: Demonstration, Explain/Verbal, Printed Responses: State content correctly [employer]) Signed: 12/30/2021 5:56:15 PM By: Lorrin Jackson Entered By: Lorrin Jackson on 12/30/2021 13:24:13 -------------------------------------------------------------------------------- Wound Assessment Details Patient Name: Date of Service: [name] [name], [name] MIE  L. 12/30/2021 1:00 PM Medical Record Number: [id number] Patient Account Number: [id number] Date of Birth/Sex: Treating RN: 03-Nov-1971 (51 y.o. Mare Ferrari Primary Care Shantea Poulton: [name] Other Clinician: Referring Jazlynn Nemetz: Treating Joshau Code/Extender: Delano Metz in Treatment: 51 Wound Status Wound Number: 2R Primary Diabetic Wound/Ulcer of the Lower Extremity Etiology: Wound Location: Left Metatarsal head first Wound Status: Open Wounding Event: Blister Comorbid Anemia, Hypertension, Peripheral Venous Disease, Type II Date Acquired: 06/23/2020 History: Diabetes Weeks Of Treatment: 71 Clustered Wound: No Photos Wound Measurements Length: (cm) 1 Width: (cm) 0.5 Depth: (cm) 0.3 Area: (cm) 0.393 Volume: (cm) 0.118 % Reduction in Area: -1167.7% % Reduction in Volume: -3833.3% Epithelialization: Medium (34-66%) Wound Description Classification: Grade 2 Wound Margin: Thickened Exudate Amount: Medium Exudate Type: Serosanguineous Exudate Color: red, brown Foul Odor After Cleansing: No Slough/Fibrino Yes Wound Bed Granulation Amount: Large (67-100%) Exposed Structure Granulation Quality: Red Fascia Exposed: No Necrotic Amount: Small (1-33%) Fat Layer (Subcutaneous Tissue) Exposed: Yes Tendon Exposed: No Muscle Exposed: No Joint Exposed: No Bone Exposed: No Treatment Notes Wound #2R (Metatarsal head first) Wound Laterality: Left Cleanser Soap and Water Discharge Instruction: May shower and wash wound with dial antibacterial soap and water prior to dressing change. Wound Cleanser Discharge Instruction: Cleanse the wound with wound cleanser prior to applying a clean dressing using gauze sponges, not tissue or cotton balls. Peri-Wound Care 40% Urea Cream Discharge Instruction: Apply to callous around wound Topical BlastX Primary Dressing FIBRACOL Plus Dressing, 2x2 in (collagen) Discharge Instruction: Moisten collagen with saline  or hydrogel Secondary Dressing Woven Gauze Sponges 2x2 in Discharge Instruction: Apply over primary dressing as directed. Optifoam Non-Adhesive Dressing, 4x4 in Discharge Instruction: cut to make foam donut to help offload Secured With [place] [place] Surgical T ape, 2x2 (in/yd) Discharge Instruction: Secure dressing with tape as directed. Compression Wrap Compression Stockings Add-Ons Electronic Signature(s) Signed: 12/30/2021 4:27:37 PM By: Sharyn Creamer RN, BSN Entered By: Sharyn Creamer on 12/30/2021 13:26:58 -------------------------------------------------------------------------------- Vitals Details Patient Name: Date of Service: [name] [name], [name] 12/30/2021 1:00 PM Medical Record Number: [id number] Patient Account Number: [id number] Date of Birth/Sex: Treating RN: 1971/09/19 (51 y.o. Mare Ferrari Primary Care Ethelbert Thain: [name] Other Clinician: Referring Isatu Macinnes: Treating Madicyn Mesina/Extender: Delano Metz in Treatment: 56 Vital Signs Time Taken: 13:20 Temperature (F): 97.7 Height (in): 77 Pulse (bpm): 85 Weight (lbs): 232 Respiratory Rate (breaths/min): 17 Body Mass Index (BMI): 27.5 Blood Pressure (mmHg): 151/81 Capillary Blood Glucose (mg/dl): 154 Reference Range: 80 - 120 mg / dl Electronic Signature(s) Signed: 12/30/2021 4:27:37 PM By: Sharyn Creamer RN, BSN Entered By: Sharyn Creamer on 12/30/2021 13:28:14

## 2022-01-06 ENCOUNTER — Other Ambulatory Visit: Payer: Self-pay

## 2022-01-06 ENCOUNTER — Encounter (HOSPITAL_BASED_OUTPATIENT_CLINIC_OR_DEPARTMENT_OTHER): Payer: Medicare Other | Admitting: Internal Medicine

## 2022-01-06 DIAGNOSIS — Z89511 Acquired absence of right leg below knee: Secondary | ICD-10-CM

## 2022-01-06 DIAGNOSIS — E11621 Type 2 diabetes mellitus with foot ulcer: Secondary | ICD-10-CM

## 2022-01-06 DIAGNOSIS — E1142 Type 2 diabetes mellitus with diabetic polyneuropathy: Secondary | ICD-10-CM | POA: Diagnosis not present

## 2022-01-06 DIAGNOSIS — L97522 Non-pressure chronic ulcer of other part of left foot with fat layer exposed: Secondary | ICD-10-CM | POA: Diagnosis not present

## 2022-01-06 NOTE — Progress Notes (Addendum)
[name] [name], [name] [name] [name] ([id number]) Visit Report for 01/06/2022 Chief Complaint Document Details Patient Name: Date of Service: [name] [name] [name] 01/06/2022 12:45 PM Medical Record Number: [id number] Patient Account Number: [id number] Date of Birth/Sex: Treating RN: 02/22/[name] [name] [name] (51 y.o. M) Primary Care Provider: [name] Other Clinician: Referring Provider: Treating Provider/Extender: Delano Metz in Treatment: 33 Information Obtained from: Patient Chief Complaint patient is here for a review of the wound on his plantar left first metatarsal head and back of his right leg Electronic Signature(s) Signed: 01/06/2022 1:56:[name] PM By: Kalman Shan DO Entered By: Kalman Shan on 01/06/2022 13:44:55 -------------------------------------------------------------------------------- HPI Details Patient Name: Date of Service: [name] [name] [name], [name] [name] [name] [name]. 01/06/2022 12:45 PM Medical Record Number: [id number] Patient Account Number: [id number] Date of Birth/Sex: Treating RN: [name] [name] [name] (51 y.o. M) Primary Care Provider: [name] Other Clinician: Referring Provider: Treating Provider/Extender: Delano Metz in Treatment: 42 History of Present Illness HPI Description: 04/21/18 ADMISSION This is a 51 year old man who is a type II diabetic. In spite of fact his hemoglobin A1c is actually quite good 5.73 months ago he is at a really difficult time over the last 6-7 months. He developed a rapidly progressive infection in the right foot in November 2018 associated with osteomyelitis and necrotizing fasciitis. 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 The stump required and a revision on 11/24/17. The stump revision was advertised as being secondary to falls. I'm not sure the progressive history here however this area is actually closed over. The patient tells me over the same timeframe he has had wounds on the plantar aspect of his left foot. In the  ED saw Dr. Sharol Given in April of this year. 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 It is really not clear that this patient is been dressing this with anything. He came in with the clinic without any specific dressing on the wound areas using his own tennis shoe. He tells me that he only ambulates of course to do a pivot transfer. He does not have his prosthesis for the right leg as of yet and he blames Medicaid for this. He does however use the foot to push himself along in his wheelchair at home. The patient has not had formal arterial studies. ABI in our clinic on the left was 1.13. Patient's past medical history includes type 2 diabetes, fracture of the right fibula. I see that he was treated for abscesses on his right buttock and chest in 2016. I did not look at the microbiology of this. 04/28/18; patient comes back in the clinic today with the wound pretty much the same as when he came in here last week. Small opening lots of undermining relatively. He tells [place] that nothing is really been on this for 3 days in spite of the fact that we gave him enough to dress this easily especially such a small wound. He says he lives with his mother, she is not capable of assisting with this he is changing the dressings himself. 05/05/18; much better-looking wound today. Smaller. There is some undermining medially however that's only perhaps 2 mm. No surrounding erythema. We've been using silver collagen 05/12/18; small wound on the first metatarsal head. No undermining no surrounding erythema. We've been using silver collagen he has home health coming out to change 05/20/18 the wound on the first metatarsal head looks better. Covered in surface debris/callus nonviable tissue. Required debridement but post debridement  this looks quite good. We've been using silver collagen. He has home health 05/27/18; first metatarsal head wound continues to improve. Just about  completely closed. Still a lot of surface debridement callus. We've been using silver collagen 06/06/18; the first metatarsal wound is completely healed over. There is still a lot of callus. I gently removed some of this just to make sure that there was no open area and there is not. The patient mentions to me that his left leg has felt like "lead" for about a week READMISSION 9/[name]/2021 This is a 51 year old man with type 2 diabetes. He returns to clinic today with a 1 month history of a blister and callus over the first metatarsal head. This is in exactly the same area as when he was here in 2019. 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 He has standard running shoes on the left foot to match the area in his prosthesis. He has only been covering this area with a Band-Aid has not really been specifically dressing this. ABI in our clinic on the left was 1.16. This is essentially stable from the value in 2019 10/1 the patient has a linear wound over the left first metatarsal head. Again not a lot of callus thick skin around this that I removed with a #3 curette. This is not go deep to bone or muscle it does not look to be infected. 10/8 first metatarsal head. The wound looks like it is closing however this is going to be a difficult area to offload in the future. He has a size 15 shoe and he says his shoes are years old. The inserts in these current shoes are totally useless and I have advised him to replace these. He may need a new pair of shoes and I have he got original prosthesis at [facility] [place] I have asked him to go back there. 11/9; the patient has not been here in more than a month. Not sure he was healed when he was here the last time. I told him he would have to get new shoes and certainly offload the area over the left first metatarsal head. He has done nothing of the kind. He arrives back in clinic with the same old new  balance sneakers. A very large separating callus over the first metatarsal head on the left 11/16; he has purchased new shoes and has at least 2 insoles like I asked. The wound is measuring much smaller. He is using silver alginate he changes the dressing himself 11/30; wound is measurably smaller in length of about a half a centimeter. This is generally very little depth. We have been using silver alginate. He changes the dressing himself every second day. 12/14; wound is about the same size. Significant undermining medially relative to the size of the wound. We have been using silver alginate. There is not a way to offload this area as he walks with a prosthesis on the right leg 12/28; wound is about the same size. Again he has undermining medially. I am using polymen on this 12/02/2020; the wound looks smaller. Still a senescent edge. We have been using polymen 1/[name]; the wound is come down a few millimeters. Still a senescent edge. I have been using polymen 2/8; the wound is come down slightly. Still with slight undermining from 12-6 o'clock I have been using polymen partially to get offloading on this area which is opposed by his  prosthesis on the other leg. 2/22 quite open improvement he still has a comma shaped wound on the left first metatarsal head. Some depth. Using polymen 3/14; he still has the same problem of a comma shaped area over the left first metatarsal head. This seems to have had more depth today at 0.6 cm. We have been using polymen. The patient changes this himself every second day. I explored the idea of a total contact cast on the left leg however this is his driving foot. He was not enamored with the idea of not being able to drive and states that he does not have anybody else to get him around 3/28; again he comes in with a smaller looking wound however there is depth and undermining requiring debridement. We have been using Hydrofera Blue, changed to silver collagen  today. The patient is doing the dressing himself 4/11; patient presents today for follow-up of his left diabetic foot wound. He denies any issues in the past 2 weeks. He is currently using silver collagen every other day with dressing changes. He does not use diabetic shoes or special inserts to his sneakers. He does not use felt pads for offloading. 4/19; patient presents for his 1 week follow-up. He denies any issues. He reports using Hydrofera Blue and has not been using silver collagen for dressing changes. He reports minimal drainage to the wound. He overall feels well. 5/3; patient presents for 2-week follow-up. He has been using silver collagen without issues. He has no complaints today and states he overall feels well. 5/17; patient presents for 2-week follow-up. He states that the sleeve is rubbing up against the back of his right leg and causing a wound. He states this happened a week ago and has not been dressing the wound. The plantar wound is stable and he has been using collagen every other day. 5/[name]; Patient was had a recent hospitalization for cellulitis of his right popliteal fossa. He was admitted and started on IV antibiotics. He does not recall the plantar wound dressed while inpatient. He feels a lot better today. He was discharged on 5/21 on oral antibiotics and has not picked this up until today. He reports using collagen to the plantar wound since discharge. He is keeping the right posterior knee wound covered. He is getting a new sleeve for his prostatic on 5/26. He currently denies signs of infection. 6/1; patient presents for 1 week follow-up. He is still on doxycycline. He has not been dressing the back of the right knee wound. He states he is receiving his new prosthetic sleeve tomorrow. He states he is using silver alginate to the plantar foot wound. He currently denies signs of infection. He states he has not been offloading the left foot wound 6/7; patient presents for  1 week follow-up. He completes his last dose of doxycycline today. He finally received his prosthetic sleeve for the right side and he states it feels much better. He has been using antibiotic ointment to the back of that right knee wound. He has been using silver alginate to the plantar foot wound. He currently denies signs of infection. 6/21; patient presents for 2-week follow-up. He reports no issues with his prosthetic sleeve. He has been using antibiotic ointment to the back of the right knee. He has been using silver alginate to the plantar foot wound. He has no issues or complaints today. He denies signs of infection. 7/5; patient presents for 2-week follow-up. He reports no issues or complaints today. He  continues to use antibiotic ointment to the right posterior knee wound and silver alginate to the left plantar foot wound. He denies signs of infection. 7/19; patient presents for 2-week follow-up. He continues to use antibiotic ointment to the right posterior knee and silver alginate to the left plantar foot wound. He has no issues or complaints today. He denies signs of infection. 8/4; patient presents for 2-week follow-up. He has no issues or complaints today. He denies signs of infection. 8/18; patient presents for 2-week follow-up. He has no issues or concerns today. He has been approved for Apligraf and he would like to have this placed today. He denies signs of infection and reports that his right posterior knee wound is closed. 8/25; patient presents for 1 week follow-up. He had no issues with his first Apligraf placement last week. He did however develop an abscess to the back of his right knee. He reports tenderness to this area. He denies systemic signs of infection. 9/1; patient presents for 1 week follow-up. He has had no issues with his plantar foot wound. The Apligraf has stayed in place over the past week. A wound culture was done at last clinic visit and a new antibiotic was  sent to the pharmacy. Patient has not received this yet. He reports some mild tenderness to the back of his right knee. He denies systemic signs of infection. 9/8; patient presents for 1 week follow-up. He states he took Keflex and had diarrhea as a side effect. He was able to complete the course. He reports continued tenderness to the right posterior knee wound. He has some drainage still. He denies increased warmth or erythema to the area. He denies fever/chills or nausea/vomiting. He has no issues or complaints today about his plantar foot wound. 9/15; patient presents for follow-up. He reports improvement in pain to the back of his right leg. He does not report any drainage. He had an ultrasound completed on 9/9 that showed a superficial fluid collection concerning for abscess. We attempted to call patient with results with no answer. We did put an urgent referral to general surgery and he reports he has not received a call. He currently denies systemic signs of infection. He reports no issues to his left plantar foot wound. 9/27; patient presents for follow-up. He has no issues or complaints today. He has had no issues with the previous wound to the back of his right leg. He has not made an appointment to see general surgery. He had Apligraf placed at last clinic visit to the plantar foot wound. He currently denies signs of infection. 10/7; patient presents for follow-up. Upon entering the room there is vomit throughout the entire floor. Patient states he does not feel well. I recommended he go to urgent care. I recommended following up next week for wound care. 10/10; patient presents for follow-up. He came last week for his follow-up however he was ill and started vomiting in the exam room. Today he states he feels better however he continues to have vomiting episodes. He states he attempted to go to urgent care/the ED but could not find a parking spot. He subsequently went home. He has no  issues or complaints today regarding the foot wound. He never followed up with general surgery for the abscess identified on ultrasound to the back of his right knee. 10/20; patient presents for follow-up. He has no issues or complaints today. Overall he feels well today. 10/27; patient presents for follow-up. He has no issues or  complaints today. 11/3; patient presents for follow-up. He has no issues or complaints today. He states he does not want to follow-up with orthopedics to reevaluate the abscess in the back of his right leg. He denies signs of infection. 11/15; patient presents for follow-up. He has no issues or complaints today. He had been using silver alginate to the left first met head wound. He denies signs of infection. 11/22; patient presents for follow-up. He had Affinity placed at last clinic visit. He has no issues or complaints today. 11/29; patient presents for follow-up. He is in good spirits today. He has no issues or complaints today. 12/6; patient presents for follow-up. He has no issues or complaints today. 12/13; patient presents for follow-up. He denies signs of infection. He has no issues or complaints today. 12/20; patient presents for follow-up. He has no issues or complaints today. He denies signs of infection. 12/29; patient presents for follow-up. He states that a few days ago he has had reaccumulation of an abscess behind the right knee. He denies increased warmth or erythema to the area. He denies fever/chills or drainage. 1/6; patient presents for follow-up. He states he went to the [facility] emergency department to be evaluated for the abscess to his right posterior knee. He was started on clindamycin. They were unable to drain the abscess. Patient states that eventually the abscess ruptured. He reports improvement in his symptoms. He has no issues or complaints about his left foot wound. 1/17; patient presents for follow-up. He has no issues or complaints  today. 1/[name]; patient presents for follow-up. He has no issues or complaints today. He denies signs of infection. 1/31; patient presents for follow-up. He has been approved for Affinity however has a 20% co-pay. Patient has declined further Affinity treatments due to this. He currently denies signs of infection. 2/7; patient presents for follow-up. He has no issues or complaints today. He denies signs of infection. He has been doing Hydrofera Blue dressing changes. 2/14; patient presents for follow-up. He has no issues or complaints today. He has been using blast X with collagen to the wound bed. He currently denies any issues. Electronic Signature(s) Signed: 01/06/2022 1:56:[name] PM By: Kalman Shan DO Entered By: Kalman Shan on 01/06/2022 13:45:36 -------------------------------------------------------------------------------- Physical Exam Details Patient Name: Date of Service: [name] [name] [name], [name] [name] [name] [name]. 01/06/2022 12:45 PM Medical Record Number: [id number] Patient Account Number: [id number] Date of Birth/Sex: Treating RN: [name] [name] [name], [name] [name] [name] (51 y.o. M) Primary Care Provider: [name] Other Clinician: Referring Provider: Treating Provider/Extender: Delano Metz in Treatment: 15 Constitutional respirations regular, non-labored and within target range for patient.. Cardiovascular 2+ dorsalis pedis/posterior tibialis pulses. Psychiatric pleasant and cooperative. Notes Left leg: T the first met head there is an open slit like wound with granulation tissue and circumferential callus. No signs of infection. o Electronic Signature(s) Signed: 01/06/2022 1:56:[name] PM By: Kalman Shan DO Entered By: Kalman Shan on 01/06/2022 13:47:14 -------------------------------------------------------------------------------- Physician Orders Details Patient Name: Date of Service: [name] [name] [name], [name] [name] [name] [name]. 01/06/2022 12:45 PM Medical Record Number: [id number] Patient  Account Number: [id number] Date of Birth/Sex: Treating RN: [name] [name] [name] (51 y.o. [name] Primary Care Provider: [name] Other Clinician: Referring Provider: Treating Provider/Extender: Delano Metz in Treatment: 64 Verbal / Phone Orders: No Diagnosis Coding Follow-up Appointments ppointment in 2 weeks. - with Dr. Heber Sumrall (Room 7, [facility]) Return A Edema Control - Lymphedema / SCD / Other Elevate legs to the level of the heart or above for 30 minutes daily  and/or when sitting, a frequency of: - throughout the day Avoid standing for long periods of time. Off-Loading Other: - minimize walking on left foot related to pressure to wound. Aid in wound healing. Wound Treatment Wound #2R - Metatarsal head first Wound Laterality: Left Cleanser: Soap and Water 1 x Per Week/15 Days Discharge Instructions: May shower and wash wound with dial antibacterial soap and water prior to dressing change. Cleanser: Wound Cleanser (Generic) 1 x Per Week/15 Days Discharge Instructions: Cleanse the wound with wound cleanser prior to applying a clean dressing using gauze sponges, not tissue or cotton balls. Peri-Wound Care: 40% Urea Cream 1 x Per Week/15 Days Discharge Instructions: Apply to callous around wound Topical: BlastX 1 x Per Week/15 Days Prim Dressing: FIBRACOL Plus Dressing, 2x2 in (collagen) 1 x Per Week/15 Days ary Discharge Instructions: Moisten collagen with saline or hydrogel Secondary Dressing: Woven Gauze Sponges 2x2 in (DME) (Generic) 1 x Per Week/15 Days Discharge Instructions: Apply over primary dressing as directed. Secondary Dressing: Optifoam Non-Adhesive Dressing, 4x4 in (DME) (Generic) 1 x Per Week/15 Days Discharge Instructions: cut to make foam donut to help offload Secured With: Kerlix Roll Sterile, 4.5x3.1 (in/yd) (DME) (Generic) 1 x Per Week/15 Days Discharge Instructions: Secure with Kerlix as directed. Secured With: 36M Medipore H Soft  Cloth Surgical Tape, 2x2 (in/yd) (DME) (Generic) 1 x Per Week/15 Days Discharge Instructions: Secure dressing with tape as directed. Electronic Signature(s) Signed: 01/06/2022 3:01:08 PM By: Lorrin Jackson Signed: 01/06/2022 3:45:32 PM By: Kalman Shan DO Previous Signature: 01/06/2022 1:56:[name] PM Version By: Kalman Shan DO Entered By: Lorrin Jackson on 01/06/2022 14:00:23 -------------------------------------------------------------------------------- Problem List Details Patient Name: Date of Service: [name] [name] [name], [name] [name] [name] [name]. 01/06/2022 12:45 PM Medical Record Number: [id number] Patient Account Number: [id number] Date of Birth/Sex: Treating RN: [name] [name] [name] (51 y.o. M) Primary Care Provider: [name] Other Clinician: Referring Provider: Treating Provider/Extender: Delano Metz in Treatment: 18 Active Problems ICD-10 Encounter Code Description Active Date MDM Diagnosis L97.522 Non-pressure chronic ulcer of other part of left foot with fat layer exposed 9/[name]/2021 No Yes E11.621 Type 2 diabetes mellitus with foot ulcer 9/[name]/2021 No Yes E11.42 Type 2 diabetes mellitus with diabetic polyneuropathy 9/[name]/2021 No Yes Z89.511 Acquired absence of right leg below knee 9/[name]/2021 No Yes Inactive Problems Resolved Problems ICD-10 Code Description Active Date Resolved Date S81.801D Unspecified open wound, right lower leg, subsequent encounter 04/08/2021 04/08/2021 Electronic Signature(s) Signed: 01/06/2022 1:56:[name] PM By: Kalman Shan DO Entered By: Kalman Shan on 01/06/2022 13:44:22 -------------------------------------------------------------------------------- Progress Note Details Patient Name: Date of Service: [place] [name], [name] [name] [name] [name]. 01/06/2022 12:45 PM Medical Record Number: [id number] Patient Account Number: [id number] Date of Birth/Sex: Treating RN: [name] [name] [name] (51 y.o. M) Primary Care Provider: [name] Other Clinician: Referring  Provider: Treating Provider/Extender: Delano Metz in Treatment: 37 Subjective Chief Complaint Information obtained from Patient patient is here for a review of the wound on his plantar left first metatarsal head and back of his right leg History of Present Illness (HPI) 04/21/18 ADMISSION This is a 51 year old man who is a type II diabetic. In spite of fact his hemoglobin A1c is actually quite good 5.73 months ago he is at a really difficult time over the last 6-7 months. He developed a rapidly progressive infection in the right foot in November 2018 associated with osteomyelitis and necrotizing fasciitis. 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 The stump required and a revision on 11/24/17. The stump revision was advertised as being secondary to  falls. I'm not sure the progressive history here however this area is actually closed over. The patient tells me over the same timeframe he has had wounds on the plantar aspect of his left foot. In the ED saw Dr. Sharol Given in April of this year. 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 It is really not clear that this patient is been dressing this with anything. He came in with the clinic without any specific dressing on the wound areas using his own tennis shoe. He tells me that he only ambulates of course to do a pivot transfer. He does not have his prosthesis for the right leg as of yet and he blames Medicaid for this. He does however use the foot to push himself along in his wheelchair at home. The patient has not had formal arterial studies. ABI in our clinic on the left was 1.13. Patient's past medical history includes type 2 diabetes, fracture of the right fibula. I see that he was treated for abscesses on his right buttock and chest in 2016. I did not look at the microbiology of this. 04/28/18; patient comes back in the clinic today with the wound pretty much the same as when he  came in here last week. Small opening lots of undermining relatively. He tells [place] that nothing is really been on this for 3 days in spite of the fact that we gave him enough to dress this easily especially such a small wound. He says he lives with his mother, she is not capable of assisting with this he is changing the dressings himself. 05/05/18; much better-looking wound today. Smaller. There is some undermining medially however that's only perhaps 2 mm. No surrounding erythema. We've been using silver collagen 05/12/18; small wound on the first metatarsal head. No undermining no surrounding erythema. We've been using silver collagen he has home health coming out to change 05/20/18 the wound on the first metatarsal head looks better. Covered in surface debris/callus nonviable tissue. Required debridement but post debridement this looks quite good. We've been using silver collagen. He has home health 05/27/18; first metatarsal head wound continues to improve. Just about completely closed. Still a lot of surface debridement callus. We've been using silver collagen 06/06/18; the first metatarsal wound is completely healed over. There is still a lot of callus. I gently removed some of this just to make sure that there was no open area and there is not. The patient mentions to me that his left leg has felt like "lead" for about a week READMISSION 9/[name]/2021 This is a 51 year old man with type 2 diabetes. He returns to clinic today with a 1 month history of a blister and callus over the first metatarsal head. This is in exactly the same area as when he was here in 2019. 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 He has standard running shoes on the left foot to match the area in his prosthesis. He has only been covering this area with a Band-Aid has not really been specifically dressing this. ABI in our clinic on the left was 1.16. This is essentially stable from  the value in 2019 10/1 the patient has a linear wound over the left first metatarsal head. Again not a lot of callus thick skin around this that I removed with a #3 curette. This is not go deep to bone or muscle it does not look  to be infected. 10/8 first metatarsal head. The wound looks like it is closing however this is going to be a difficult area to offload in the future. He has a size 15 shoe and he says his shoes are years old. The inserts in these current shoes are totally useless and I have advised him to replace these. He may need a new pair of shoes and I have he got original prosthesis at [facility] [place] I have asked him to go back there. 11/9; the patient has not been here in more than a month. Not sure he was healed when he was here the last time. I told him he would have to get new shoes and certainly offload the area over the left first metatarsal head. He has done nothing of the kind. He arrives back in clinic with the same old new balance sneakers. A very large separating callus over the first metatarsal head on the left 11/16; he has purchased new shoes and has at least 2 insoles like I asked. The wound is measuring much smaller. He is using silver alginate he changes the dressing himself 11/30; wound is measurably smaller in length of about a half a centimeter. This is generally very little depth. We have been using silver alginate. He changes the dressing himself every second day. 12/14; wound is about the same size. Significant undermining medially relative to the size of the wound. We have been using silver alginate. There is not a way to offload this area as he walks with a prosthesis on the right leg 12/28; wound is about the same size. Again he has undermining medially. I am using polymen on this 12/02/2020; the wound looks smaller. Still a senescent edge. We have been using polymen 1/[name]; the wound is come down a few millimeters. Still a senescent edge. I have been using  polymen 2/8; the wound is come down slightly. Still with slight undermining from 12-6 o'clock I have been using polymen partially to get offloading on this area which is opposed by his prosthesis on the other leg. 2/22 quite open improvement he still has a comma shaped wound on the left first metatarsal head. Some depth. Using polymen 3/14; he still has the same problem of a comma shaped area over the left first metatarsal head. This seems to have had more depth today at 0.6 cm. We have been using polymen. The patient changes this himself every second day. I explored the idea of a total contact cast on the left leg however this is his driving foot. He was not enamored with the idea of not being able to drive and states that he does not have anybody else to get him around 3/28; again he comes in with a smaller looking wound however there is depth and undermining requiring debridement. We have been using Hydrofera Blue, changed to silver collagen today. The patient is doing the dressing himself 4/11; patient presents today for follow-up of his left diabetic foot wound. He denies any issues in the past 2 weeks. He is currently using silver collagen every other day with dressing changes. He does not use diabetic shoes or special inserts to his sneakers. He does not use felt pads for offloading. 4/19; patient presents for his 1 week follow-up. He denies any issues. He reports using Hydrofera Blue and has not been using silver collagen for dressing changes. He reports minimal drainage to the wound. He overall feels well. 5/3; patient presents for 2-week follow-up. He has  been using silver collagen without issues. He has no complaints today and states he overall feels well. 5/17; patient presents for 2-week follow-up. He states that the sleeve is rubbing up against the back of his right leg and causing a wound. He states this happened a week ago and has not been dressing the wound. The plantar wound is  stable and he has been using collagen every other day. 5/[name]; Patient was had a recent hospitalization for cellulitis of his right popliteal fossa. He was admitted and started on IV antibiotics. He does not recall the plantar wound dressed while inpatient. He feels a lot better today. He was discharged on 5/21 on oral antibiotics and has not picked this up until today. He reports using collagen to the plantar wound since discharge. He is keeping the right posterior knee wound covered. He is getting a new sleeve for his prostatic on 5/26. He currently denies signs of infection. 6/1; patient presents for 1 week follow-up. He is still on doxycycline. He has not been dressing the back of the right knee wound. He states he is receiving his new prosthetic sleeve tomorrow. He states he is using silver alginate to the plantar foot wound. He currently denies signs of infection. He states he has not been offloading the left foot wound 6/7; patient presents for 1 week follow-up. He completes his last dose of doxycycline today. He finally received his prosthetic sleeve for the right side and he states it feels much better. He has been using antibiotic ointment to the back of that right knee wound. He has been using silver alginate to the plantar foot wound. He currently denies signs of infection. 6/21; patient presents for 2-week follow-up. He reports no issues with his prosthetic sleeve. He has been using antibiotic ointment to the back of the right knee. He has been using silver alginate to the plantar foot wound. He has no issues or complaints today. He denies signs of infection. 7/5; patient presents for 2-week follow-up. He reports no issues or complaints today. He continues to use antibiotic ointment to the right posterior knee wound and silver alginate to the left plantar foot wound. He denies signs of infection. 7/19; patient presents for 2-week follow-up. He continues to use antibiotic ointment to the  right posterior knee and silver alginate to the left plantar foot wound. He has no issues or complaints today. He denies signs of infection. 8/4; patient presents for 2-week follow-up. He has no issues or complaints today. He denies signs of infection. 8/18; patient presents for 2-week follow-up. He has no issues or concerns today. He has been approved for Apligraf and he would like to have this placed today. He denies signs of infection and reports that his right posterior knee wound is closed. 8/25; patient presents for 1 week follow-up. He had no issues with his first Apligraf placement last week. He did however develop an abscess to the back of his right knee. He reports tenderness to this area. He denies systemic signs of infection. 9/1; patient presents for 1 week follow-up. He has had no issues with his plantar foot wound. The Apligraf has stayed in place over the past week. A wound culture was done at last clinic visit and a new antibiotic was sent to the pharmacy. Patient has not received this yet. He reports some mild tenderness to the back of his right knee. He denies systemic signs of infection. 9/8; patient presents for 1 week follow-up. He states he took Keflex  and had diarrhea as a side effect. He was able to complete the course. He reports continued tenderness to the right posterior knee wound. He has some drainage still. He denies increased warmth or erythema to the area. He denies fever/chills or nausea/vomiting. He has no issues or complaints today about his plantar foot wound. 9/15; patient presents for follow-up. He reports improvement in pain to the back of his right leg. He does not report any drainage. He had an ultrasound completed on 9/9 that showed a superficial fluid collection concerning for abscess. We attempted to call patient with results with no answer. We did put an urgent referral to general surgery and he reports he has not received a call. He currently denies  systemic signs of infection. He reports no issues to his left plantar foot wound. 9/27; patient presents for follow-up. He has no issues or complaints today. He has had no issues with the previous wound to the back of his right leg. He has not made an appointment to see general surgery. He had Apligraf placed at last clinic visit to the plantar foot wound. He currently denies signs of infection. 10/7; patient presents for follow-up. Upon entering the room there is vomit throughout the entire floor. Patient states he does not feel well. I recommended he go to urgent care. I recommended following up next week for wound care. 10/10; patient presents for follow-up. He came last week for his follow-up however he was ill and started vomiting in the exam room. Today he states he feels better however he continues to have vomiting episodes. He states he attempted to go to urgent care/the ED but could not find a parking spot. He subsequently went home. He has no issues or complaints today regarding the foot wound. He never followed up with general surgery for the abscess identified on ultrasound to the back of his right knee. 10/20; patient presents for follow-up. He has no issues or complaints today. Overall he feels well today. 10/27; patient presents for follow-up. He has no issues or complaints today. 11/3; patient presents for follow-up. He has no issues or complaints today. He states he does not want to follow-up with orthopedics to reevaluate the abscess in the back of his right leg. He denies signs of infection. 11/15; patient presents for follow-up. He has no issues or complaints today. He had been using silver alginate to the left first met head wound. He denies signs of infection. 11/22; patient presents for follow-up. He had Affinity placed at last clinic visit. He has no issues or complaints today. 11/29; patient presents for follow-up. He is in good spirits today. He has no issues or complaints  today. 12/6; patient presents for follow-up. He has no issues or complaints today. 12/13; patient presents for follow-up. He denies signs of infection. He has no issues or complaints today. 12/20; patient presents for follow-up. He has no issues or complaints today. He denies signs of infection. 12/29; patient presents for follow-up. He states that a few days ago he has had reaccumulation of an abscess behind the right knee. He denies increased warmth or erythema to the area. He denies fever/chills or drainage. 1/6; patient presents for follow-up. He states he went to the [facility] emergency department to be evaluated for the abscess to his right posterior knee. He was started on clindamycin. They were unable to drain the abscess. Patient states that eventually the abscess ruptured. He reports improvement in his symptoms. He has no issues or complaints about  his left foot wound. 1/17; patient presents for follow-up. He has no issues or complaints today. 1/[name]; patient presents for follow-up. He has no issues or complaints today. He denies signs of infection. 1/31; patient presents for follow-up. He has been approved for Affinity however has a 20% co-pay. Patient has declined further Affinity treatments due to this. He currently denies signs of infection. 2/7; patient presents for follow-up. He has no issues or complaints today. He denies signs of infection. He has been doing Hydrofera Blue dressing changes. 2/14; patient presents for follow-up. He has no issues or complaints today. He has been using blast X with collagen to the wound bed. He currently denies any issues. Patient History Information obtained from Patient. Family History Diabetes - Maternal Grandparents,Mother, Heart Disease - Father, Hypertension - Father, No family history of Cancer, Hereditary Spherocytosis, Kidney Disease, Lung Disease, Seizures, Stroke, Thyroid Problems, Tuberculosis. Social History Current every day  smoker, Marital Status - Single, Alcohol Use - Never, Drug Use - No History, Caffeine Use - Never. Medical History Eyes Denies history of Cataracts, Glaucoma, Optic Neuritis Ear/Nose/Mouth/Throat Denies history of Chronic sinus problems/congestion, Middle ear problems Hematologic/Lymphatic Patient has history of Anemia Denies history of Hemophilia, Human Immunodeficiency Virus, Lymphedema, Sickle Cell Disease Respiratory Denies history of Aspiration, Asthma, Chronic Obstructive Pulmonary Disease (COPD), Pneumothorax, Sleep Apnea, Tuberculosis Cardiovascular Patient has history of Hypertension, Peripheral Venous Disease Denies history of Angina, Arrhythmia, Congestive Heart Failure, Coronary Artery Disease, Deep Vein Thrombosis, Hypotension, Myocardial Infarction, Peripheral Arterial Disease, Phlebitis, Vasculitis Gastrointestinal Denies history of Cirrhosis , Colitis, Crohnoos, Hepatitis A, Hepatitis B, Hepatitis C Endocrine Patient has history of Type II Diabetes - oral meds Denies history of Type I Diabetes Genitourinary Denies history of End Stage Renal Disease Immunological Denies history of Lupus Erythematosus, Raynaudoos, Scleroderma Integumentary (Skin) Denies history of History of Burn Musculoskeletal Denies history of Gout, Rheumatoid Arthritis, Osteoarthritis, Osteomyelitis Neurologic Denies history of Dementia, Neuropathy, Quadriplegia, Paraplegia, Seizure Disorder Oncologic Denies history of Received Chemotherapy, Received Radiation Psychiatric Denies history of Anorexia/bulimia, Confinement Anxiety Hospitalization/Surgery History - fever 105, sepsis cellulitis right popliteal fossa 04/09/2021. Medical A Surgical History Notes [name] Constitutional Symptoms (General Health) falls , leukocytosis Respiratory During waking hours and catches himself not breathing, "gasp for air". Saw Dr Wynonia Lawman, he couldn't find a reason Objective Constitutional respirations regular,  non-labored and within target range for patient.. Vitals Time Taken: 12:36 PM, Height: 77 in, Weight: 232 lbs, BMI: 27.5, Temperature: 97.9 F, Pulse: 86 bpm, Respiratory Rate: 18 breaths/min, Blood Pressure: 135/82 mmHg, Capillary Blood Glucose: 210 mg/dl. Cardiovascular 2+ dorsalis pedis/posterior tibialis pulses. Psychiatric pleasant and cooperative. General Notes: Left leg: T the first met head there is an open slit like wound with granulation tissue and circumferential callus. No signs of infection. o Integumentary (Hair, Skin) Wound #2R status is Open. Original cause of wound was Blister. The date acquired was: 06/23/2020. The wound has been in treatment 72 weeks. The wound is located on the Left Metatarsal head first. The wound measures 0.5cm length x 0.4cm width x 0.2cm depth; 0.157cm^2 area and 0.031cm^3 volume. There is Fat Layer (Subcutaneous Tissue) exposed. There is no tunneling or undermining noted. There is a medium amount of serosanguineous drainage noted. The wound margin is distinct with the outline attached to the wound base. There is large (67-100%) red granulation within the wound bed. There is a small (1-33%) amount of necrotic tissue within the wound bed. 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 I recommended continuing blast X and collagen Along with aggressive offloading. No surrounding signs of infection. Follow-up in 2 weeks. Plan Follow-up Appointments: Return Appointment in 2 weeks. - with Dr. Heber Citrus (Room 7, [facility]) Edema Control - Lymphedema / SCD / Other: Elevate legs to the level of the heart or above for 30 minutes daily and/or when sitting, a  frequency of: - throughout the day Avoid standing for long periods of time. Off-Loading: Other: - minimize walking on left foot related to pressure to wound. Aid in wound healing. WOUND #2R: - Metatarsal head first Wound Laterality: Left Cleanser: Soap and Water 1 x Per Week/15 Days Discharge Instructions: May shower and wash wound with dial antibacterial soap and water prior to dressing change. Cleanser: Wound Cleanser (Generic) 1 x Per Week/15 Days Discharge Instructions: Cleanse the wound with wound cleanser prior to applying a clean dressing using gauze sponges, not tissue or cotton balls. Peri-Wound Care: 40% Urea Cream 1 x Per Week/15 Days Discharge Instructions: Apply to callous around wound Topical: BlastX 1 x Per Week/15 Days Prim Dressing: FIBRACOL Plus Dressing, 2x2 in (collagen) 1 x Per Week/15 Days ary Discharge Instructions: Moisten collagen with saline or hydrogel Secondary Dressing: Woven Gauze Sponges 2x2 in (Generic) 1 x Per Week/15 Days Discharge Instructions: Apply over primary dressing as directed. Secondary Dressing: Optifoam Non-Adhesive Dressing, 4x4 in (Generic) 1 x Per Week/15 Days Discharge Instructions: cut to make foam donut to help offload Secured With: 88M [place] [facility] T ape, 2x2 (in/yd) (Generic) 1 x Per Week/15 Days Discharge Instructions: Secure dressing with tape as directed. 1. Collagen and blast X 2. Aggressive offloading 3. Follow-up in 2 weeks Electronic Signature(s) Signed: 01/06/2022 1:56:[name] PM By: Kalman Shan DO Entered By: Kalman Shan on 01/06/2022 13:55:18 -------------------------------------------------------------------------------- HxROS Details Patient Name: Date of Service: [name] [name] [name], [name] [name] [name] [name]. 01/06/2022 12:45 PM Medical Record Number: [id number] Patient Account Number: [id number] Date of Birth/Sex: Treating RN: [name] [name] [name] (51 y.o. M) Primary Care Provider: [name] Other Clinician: Referring  Provider: Treating Provider/Extender: Delano Metz in Treatment: 37 Information Obtained From Patient Constitutional Symptoms (General Health) Medical History: Past Medical History Notes: falls , leukocytosis Eyes Medical History: Negative for: Cataracts; Glaucoma; Optic Neuritis Ear/Nose/Mouth/Throat Medical History: Negative for: Chronic sinus problems/congestion; Middle ear problems Hematologic/Lymphatic Medical History: Positive for: Anemia Negative for: Hemophilia; Human Immunodeficiency Virus; Lymphedema; Sickle Cell Disease Respiratory Medical History: Negative for: Aspiration; Asthma; Chronic Obstructive Pulmonary Disease (COPD); Pneumothorax; Sleep Apnea; Tuberculosis Past Medical History Notes: During waking hours and catches himself not breathing, "gasp for air". Saw Dr Wynonia Lawman, he couldn't find a reason Cardiovascular Medical History: Positive for: Hypertension; Peripheral Venous Disease Negative for: Angina; Arrhythmia; Congestive Heart Failure; Coronary Artery Disease; Deep Vein Thrombosis; Hypotension; Myocardial Infarction; Peripheral Arterial Disease; Phlebitis; Vasculitis Gastrointestinal Medical History: Negative for: Cirrhosis ; Colitis; Crohns; Hepatitis A; Hepatitis B; Hepatitis C Endocrine Medical History: Positive for: Type II Diabetes - oral meds Negative for: Type I Diabetes Time with diabetes: 2014 Treated with: Oral agents Blood sugar tested every day: Yes Tested : Blood sugar testing results: Breakfast: 91 Genitourinary Medical History: Negative for: End Stage Renal Disease Immunological Medical History: Negative for: Lupus Erythematosus; Raynauds; Scleroderma Integumentary (Skin) Medical History: Negative for: History of Burn Musculoskeletal  Medical History: Negative for: Gout; Rheumatoid Arthritis; Osteoarthritis; Osteomyelitis Neurologic Medical History: Negative for: Dementia; Neuropathy; Quadriplegia;  Paraplegia; Seizure Disorder Oncologic Medical History: Negative for: Received Chemotherapy; Received Radiation Psychiatric Medical History: Negative for: Anorexia/bulimia; Confinement Anxiety Immunizations Pneumococcal Vaccine: Received Pneumococcal Vaccination: No Implantable Devices No devices added Hospitalization / Surgery History Type of Hospitalization/Surgery fever 105, sepsis cellulitis right popliteal fossa 04/09/2021 Family and Social History Cancer: No; Diabetes: Yes - Maternal Grandparents,Mother; Heart Disease: Yes - Father; Hereditary Spherocytosis: No; Hypertension: Yes - Father; Kidney Disease: No; Lung Disease: No; Seizures: No; Stroke: No; Thyroid Problems: No; Tuberculosis: No; Current every day smoker; Marital Status - Single; Alcohol Use: Never; Drug Use: No History; Caffeine Use: Never; Financial Concerns: No; Food, Clothing or Shelter Needs: No; Support System Lacking: No; Transportation Concerns: No Electronic Signature(s) Signed: 01/06/2022 1:56:[name] PM By: Kalman Shan DO Entered By: Kalman Shan on 01/06/2022 13:45:44 -------------------------------------------------------------------------------- SuperBill Details Patient Name: Date of Service: [name], [name] [name] [name] [name]. 01/06/2022 Medical Record Number: [id number] Patient Account Number: [id number] Date of Birth/Sex: Treating RN: [name] [name] [name], [name] [name] [name] (51 y.o. [name] Primary Care Provider: [name] Other Clinician: Referring Provider: Treating Provider/Extender: Delano Metz in Treatment: 72 Diagnosis Coding ICD-10 Codes Code Description [phone] Non-pressure chronic ulcer of other part of left foot with fat layer exposed E11.621 Type 2 diabetes mellitus with foot ulcer E11.42 Type 2 diabetes mellitus with diabetic polyneuropathy Z89.511 Acquired absence of right leg below knee Facility Procedures CPT4 Code: [id number] Description: 99213 - WOUND CARE VISIT-LEV 3  EST PT Modifier: Quantity: 1 Physician Procedures : CPT4 Code Description Modifier [id number] - WC PHYS LEVEL 3 - EST PT ICD-10 Diagnosis Description L97.522 Non-pressure chronic ulcer of other part of left foot with fat layer exposed E11.621 Type 2 diabetes mellitus with foot ulcer E11.42 Type 2  diabetes mellitus with diabetic polyneuropathy Z89.511 Acquired absence of right leg below knee Quantity: 1 Electronic Signature(s) Signed: 01/06/2022 1:56:[name] PM By: Kalman Shan DO Entered By: Kalman Shan on 01/06/2022 13:55:44

## 2022-01-06 NOTE — Progress Notes (Signed)
ISSIAH, HUFFAKER ([id number]) Visit Report for 01/06/2022 Arrival Information Details Patient Name: Date of Service: [name] [name] 01/06/2022 12:45 PM Medical Record Number: [id number] Patient Account Number: [id number] Date of Birth/Sex: Treating RN: Feb 18, 1971 (51 y.o. [name] Primary Care Merrilee Ancona: [name] Other Clinician: Referring Sahaj Bona: Treating Dameshia Seybold/Extender: Delano Metz in Treatment: 25 Visit Information History Since Last Visit Added or deleted any medications: No Patient Arrived: Ambulatory Any new allergies or adverse reactions: No Arrival Time: 12:37 Had a fall or experienced change in No Transfer Assistance: Manual activities of daily living that may affect Patient Identification Verified: Yes risk of falls: Secondary Verification Process Completed: Yes Signs or symptoms of abuse/neglect since last visito No Patient Requires Transmission-Based Precautions: No Hospitalized since last visit: No Patient Has Alerts: No Implantable device outside of the clinic excluding No cellular tissue based products placed in the center since last visit: Has Dressing in Place as Prescribed: Yes Pain Present Now: Yes Electronic Signature(s) Signed: 01/06/2022 3:01:08 PM By: Lorrin Jackson Entered By: Lorrin Jackson on 01/06/2022 12:37:31 -------------------------------------------------------------------------------- Clinic Level of Care Assessment Details Patient Name: Date of Service: [name] [name] 01/06/2022 12:45 PM Medical Record Number: [id number] Patient Account Number: [id number] Date of Birth/Sex: Treating RN: 24-Feb-1971 (51 y.o. [name] Primary Care Kendarius Vigen: [name] Other Clinician: Referring Quinnley Colasurdo: Treating Mckinna Demars/Extender: Delano Metz in Treatment: 72 Clinic Level of Care Assessment Items TOOL 4 Quantity Score X- 1 0 Use when only an EandM is  performed on FOLLOW-UP visit ASSESSMENTS - Nursing Assessment / Reassessment X- 1 10 Reassessment of Co-morbidities (includes updates in patient status) X- 1 5 Reassessment of Adherence to Treatment Plan ASSESSMENTS - Wound and Skin A ssessment / Reassessment X - Simple Wound Assessment / Reassessment - one wound 1 5 '[]'  - 0 Complex Wound Assessment / Reassessment - multiple wounds '[]'  - 0 Dermatologic / Skin Assessment (not related to wound area) ASSESSMENTS - Focused Assessment '[]'  - 0 Circumferential Edema Measurements - multi extremities '[]'  - 0 Nutritional Assessment / Counseling / Intervention '[]'  - 0 Lower Extremity Assessment (monofilament, tuning fork, pulses) '[]'  - 0 Peripheral Arterial Disease Assessment (using hand held doppler) ASSESSMENTS - Ostomy and/or Continence Assessment and Care '[]'  - 0 Incontinence Assessment and Management '[]'  - 0 Ostomy Care Assessment and Management (repouching, etc.) PROCESS - Coordination of Care '[]'  - 0 Simple Patient / Family Education for ongoing care X- 1 20 Complex (extensive) Patient / Family Education for ongoing care X- 1 10 Staff obtains Programmer, systems, Records, T Results / Process Orders est '[]'  - 0 Staff telephones HHA, Nursing Homes / Clarify orders / etc '[]'  - 0 Routine Transfer to another Facility (non-emergent condition) '[]'  - 0 Routine Hospital Admission (non-emergent condition) '[]'  - 0 New Admissions / Biomedical engineer / Ordering NPWT Apligraf, etc. , '[]'  - 0 Emergency Hospital Admission (emergent condition) '[]'  - 0 Simple Discharge Coordination '[]'  - 0 Complex (extensive) Discharge Coordination PROCESS - Special Needs '[]'  - 0 Pediatric / Minor Patient Management '[]'  - 0 Isolation Patient Management '[]'  - 0 Hearing / Language / Visual special needs '[]'  - 0 Assessment of Community assistance (transportation, D/C planning, etc.) '[]'  - 0 Additional assistance / Altered mentation '[]'  - 0 Support Surface(s) Assessment  (bed, cushion, seat, etc.) INTERVENTIONS - Wound Cleansing / Measurement X - Simple Wound Cleansing - one wound 1 5 '[]'  - 0 Complex Wound Cleansing - multiple wounds X- 1 5 Wound  Imaging (photographs - any number of wounds) '[]'  - 0 Wound Tracing (instead of photographs) X- 1 5 Simple Wound Measurement - one wound '[]'  - 0 Complex Wound Measurement - multiple wounds INTERVENTIONS - Wound Dressings X - Small Wound Dressing one or multiple wounds 1 10 '[]'  - 0 Medium Wound Dressing one or multiple wounds '[]'  - 0 Large Wound Dressing one or multiple wounds '[]'  - 0 Application of Medications - topical '[]'  - 0 Application of Medications - injection INTERVENTIONS - Miscellaneous '[]'  - 0 External ear exam '[]'  - 0 Specimen Collection (cultures, biopsies, blood, body fluids, etc.) '[]'  - 0 Specimen(s) / Culture(s) sent or taken to Lab for analysis '[]'  - 0 Patient Transfer (multiple staff / Civil Service fast streamer / Similar devices) '[]'  - 0 Simple Staple / Suture removal (25 or less) '[]'  - 0 Complex Staple / Suture removal (26 or more) '[]'  - 0 Hypo / Hyperglycemic Management (close monitor of Blood Glucose) '[]'  - 0 Ankle / Brachial Index (ABI) - do not check if billed separately X- 1 5 Vital Signs Has the patient been seen at the hospital within the last three years: Yes Total Score: 80 Level Of Care: New/Established - Level 3 Electronic Signature(s) Signed: 01/06/2022 3:01:08 PM By: Lorrin Jackson Entered By: Lorrin Jackson on 01/06/2022 13:02:39 -------------------------------------------------------------------------------- Encounter Discharge Information Details Patient Name: Date of Service: [name] [name] ND, [name] 01/06/2022 12:45 PM Medical Record Number: [id number] Patient Account Number: [id number] Date of Birth/Sex: Treating RN: July 01, 1971 (51 y.o. [name] Primary Care Arthurine Oleary: [name] Other Clinician: Referring Jahking Lesser: Treating Cullin Dishman/Extender: Delano Metz in Treatment: 49 Encounter Discharge Information Items Discharge Condition: Stable Ambulatory Status: Ambulatory Discharge Destination: Home Transportation: Private Auto Schedule Follow-up Appointment: Yes Clinical Summary of Care: Provided on 01/06/2022 Form Type Recipient Paper Patient Patient Electronic Signature(s) Signed: 01/06/2022 3:01:08 PM By: Lorrin Jackson Entered By: Lorrin Jackson on 01/06/2022 13:03:43 -------------------------------------------------------------------------------- Lower Extremity Assessment Details Patient Name: Date of Service: [name], [name] 01/06/2022 12:45 PM Medical Record Number: [id number] Patient Account Number: [id number] Date of Birth/Sex: Treating RN: 1971-07-21 (51 y.o. [name] Primary Care Elzia Hott: [name] Other Clinician: Referring Havannah Streat: Treating Parisha Beaulac/Extender: Delano Metz in Treatment: 72 Edema Assessment Assessed: [name]: Yes] [name]: No] Edema: [Left: Ye] [Right: s] Calf Left: Right: Point of Measurement: 50 cm From Medial Instep 45 cm Ankle Left: Right: Point of Measurement: 11 cm From Medial Instep 28.5 cm Vascular Assessment Pulses: Dorsalis Pedis Palpable: [Left:Yes] Electronic Signature(s) Signed: 01/06/2022 3:01:08 PM By: Lorrin Jackson Entered By: Lorrin Jackson on 01/06/2022 12:41:09 -------------------------------------------------------------------------------- Multi Wound Chart Details Patient Name: Date of Service: [name] [name] ND, [name] 01/06/2022 12:45 PM Medical Record Number: [id number] Patient Account Number: [id number] Date of Birth/Sex: Treating RN: May 13, 1971 (51 y.o. M) Primary Care Jachai Okazaki: [name] Other Clinician: Referring Gery Sabedra: Treating Keyon Winnick/Extender: Delano Metz in Treatment: 72 Vital Signs Height(in): 77 Capillary Blood Glucose(mg/dl): 210 Weight(lbs): 232 Pulse(bpm):  36 Body Mass Index(BMI): 27.5 Blood Pressure(mmHg): 135/82 Temperature(F): 97.9 Respiratory Rate(breaths/min): 18 Photos: [N/A:N/A] Left Metatarsal head first N/A N/A Wound Location: Blister N/A N/A Wounding Event: Diabetic Wound/Ulcer of the Lower N/A N/A Primary Etiology: Extremity Anemia, Hypertension, Peripheral N/A N/A Comorbid History: Venous Disease, Type II Diabetes 06/23/2020 N/A N/A Date Acquired: 34 N/A N/A Weeks of Treatment: Open N/A N/A Wound Status: Yes N/A N/A Wound Recurrence: 0.5x0.4x0.2 N/A N/A Measurements L x W x D (cm) 0.157 N/A N/A A (cm) :  rea 0.031 N/A N/A Volume (cm) : -406.50% N/A N/A % Reduction in A rea: -933.30% N/A N/A % Reduction in Volume: Grade 2 N/A N/A Classification: Medium N/A N/A Exudate A mount: Serosanguineous N/A N/A Exudate Type: red, brown N/A N/A Exudate Color: Distinct, outline attached N/A N/A Wound Margin: Large (67-100%) N/A N/A Granulation A mount: Red N/A N/A Granulation Quality: Small (1-33%) N/A N/A Necrotic A mount: Fat Layer (Subcutaneous Tissue): Yes N/A N/A Exposed Structures: Fascia: No Tendon: No Muscle: No Joint: No Bone: No Medium (34-66%) N/A N/A Epithelialization: Calloused Periwound N/A N/A Assessment Notes: Treatment Notes Wound #2R (Metatarsal head first) Wound Laterality: Left Cleanser Soap and Water Discharge Instruction: May shower and wash wound with dial antibacterial soap and water prior to dressing change. Wound Cleanser Discharge Instruction: Cleanse the wound with wound cleanser prior to applying a clean dressing using gauze sponges, not tissue or cotton balls. Peri-Wound Care 40% Urea Cream Discharge Instruction: Apply to callous around wound Topical BlastX Primary Dressing FIBRACOL Plus Dressing, 2x2 in (collagen) Discharge Instruction: Moisten collagen with saline or hydrogel Secondary Dressing Woven Gauze Sponges 2x2 in Discharge Instruction: Apply over  primary dressing as directed. Optifoam Non-Adhesive Dressing, 4x4 in Discharge Instruction: cut to make foam donut to help offload Secured With [place] [facility] T ape, 2x2 (in/yd) Discharge Instruction: Secure dressing with tape as directed. Compression Wrap Compression Stockings Add-Ons Electronic Signature(s) Signed: 01/06/2022 1:56:07 PM By: Kalman Shan DO Entered By: Kalman Shan on 01/06/2022 13:44:31 -------------------------------------------------------------------------------- Multi-Disciplinary Care Plan Details Patient Name: Date of Service: [name] [name] ND, [name] 01/06/2022 12:45 PM Medical Record Number: [id number] Patient Account Number: [id number] Date of Birth/Sex: Treating RN: Mar 26, 1971 (51 y.o. [name] Primary Care Andyn Sales: [name] Other Clinician: Referring Lorella Gomez: Treating Kaziah Krizek/Extender: Delano Metz in Treatment: 58 Multidisciplinary Care Plan reviewed with physician Active Inactive Abuse / Safety / Falls / Self Care Management Nursing Diagnoses: History of Falls Goals: Patient/caregiver will verbalize/demonstrate measures taken to prevent injury and/or falls Date Initiated: 11/04/2021 Target Resolution Date: 02/10/2022 Goal Status: Active Interventions: Assess fall risk on admission and as needed Assess impairment of mobility on admission and as needed per policy Notes: Wound/Skin Impairment Nursing Diagnoses: Impaired tissue integrity Goals: Patient/caregiver will verbalize understanding of skin care regimen Date Initiated: 11/19/2020 Target Resolution Date: 02/10/2022 Goal Status: Active Ulcer/skin breakdown will have a volume reduction of 30% by week 4 Date Initiated: 08/16/2020 Date Inactivated: 03/03/2021 Target Resolution Date: 03/10/2021 Goal Status: Met Interventions: Provide education on ulcer and skin care Notes: 07/24/21: Wound care regimen ongoing. Electronic  Signature(s) Signed: 01/06/2022 12:51:17 PM By: Lorrin Jackson Entered By: Lorrin Jackson on 01/06/2022 12:51:17 -------------------------------------------------------------------------------- Pain Assessment Details Patient Name: Date of Service: [name] [name] ND, [name] 01/06/2022 12:45 PM Medical Record Number: [id number] Patient Account Number: [id number] Date of Birth/Sex: Treating RN: 09/14/1971 (51 y.o. [name] Primary Care Jerid Catherman: [name] Other Clinician: Referring Brandilee Pies: Treating Dimarco Minkin/Extender: Delano Metz in Treatment: 52 Active Problems Location of Pain Severity and Description of Pain Patient Has Paino Yes Site Locations Pain Location: Pain in Ulcers With Dressing Change: Yes Duration of the Pain. Constant / Intermittento Intermittent Rate the pain. Current Pain Level: 6 Character of Pain Describe the Pain: Throbbing Pain Management and Medication Current Pain Management: Medication: Yes Cold Application: No Rest: Yes Massage: No Activity: No T.E.N.S.: No Heat Application: No Leg drop or elevation: No Is the Current Pain Management Adequate: Adequate How does your  wound impact your activities of daily livingo Sleep: No Bathing: No Appetite: No Relationship With Others: No Bladder Continence: No Emotions: No Bowel Continence: No Work: No Toileting: No Drive: No Dressing: No Hobbies: No Electronic Signature(s) Signed: 01/06/2022 3:01:08 PM By: Lorrin Jackson Entered By: Lorrin Jackson on 01/06/2022 12:40:49 -------------------------------------------------------------------------------- Patient/Caregiver Education Details Patient Name: Date of Service: [name] [name] 2/14/2023andnbsp12:45 PM Medical Record Number: [id number] Patient Account Number: [id number] Date of Birth/Gender: Treating RN: December 19, 1970 (51 y.o. [name] Primary Care Physician: [name] Other  Clinician: Referring Physician: Treating Physician/Extender: Delano Metz in Treatment: 59 Education Assessment Education Provided To: Patient Education Topics Provided Wound/Skin Impairment: Methods: Explain/Verbal, Printed Responses: State content correctly [employer]) Signed: 01/06/2022 3:01:08 PM By: Lorrin Jackson Entered By: Lorrin Jackson on 01/06/2022 12:51:37 -------------------------------------------------------------------------------- Wound Assessment Details Patient Name: Date of Service: [name] [name] ND, [name] 01/06/2022 12:45 PM Medical Record Number: [id number] Patient Account Number: [id number] Date of Birth/Sex: Treating RN: [birth year] (51 y.o. [name] Primary Care Alvin Rubano: [name] Other Clinician: Referring Danyella Mcginty: Treating Siena Poehler/Extender: Delano Metz in Treatment: 72 Wound Status Wound Number: 2R Primary Diabetic Wound/Ulcer of the Lower Extremity Etiology: Wound Location: Left Metatarsal head first Wound Status: Open Wounding Event: Blister Comorbid Anemia, Hypertension, Peripheral Venous Disease, Type II Date Acquired: 06/23/2020 History: Diabetes Weeks Of Treatment: 72 Clustered Wound: No Photos Wound Measurements Length: (cm) 0.5 Width: (cm) 0.4 Depth: (cm) 0.2 Area: (cm) 0.157 Volume: (cm) 0.031 % Reduction in Area: -406.5% % Reduction in Volume: -933.3% Epithelialization: Medium (34-66%) Tunneling: No Undermining: No Wound Description Classification: Grade 2 Wound Margin: Distinct, outline attached Exudate Amount: Medium Exudate Type: Serosanguineous Exudate Color: red, brown Foul Odor After Cleansing: No Slough/Fibrino Yes Wound Bed Granulation Amount: Large (67-100%) Exposed Structure Granulation Quality: Red Fascia Exposed: No Necrotic Amount: Small (1-33%) Fat Layer (Subcutaneous Tissue) Exposed: Yes Tendon Exposed: No Muscle Exposed:  No Joint Exposed: No Bone Exposed: No Assessment Notes Calloused Periwound Treatment Notes Wound #2R (Metatarsal head first) Wound Laterality: Left Cleanser Soap and Water Discharge Instruction: May shower and wash wound with dial antibacterial soap and water prior to dressing change. Wound Cleanser Discharge Instruction: Cleanse the wound with wound cleanser prior to applying a clean dressing using gauze sponges, not tissue or cotton balls. Peri-Wound Care 40% Urea Cream Discharge Instruction: Apply to callous around wound Topical BlastX Primary Dressing FIBRACOL Plus Dressing, 2x2 in (collagen) Discharge Instruction: Moisten collagen with saline or hydrogel Secondary Dressing Woven Gauze Sponges 2x2 in Discharge Instruction: Apply over primary dressing as directed. Optifoam Non-Adhesive Dressing, 4x4 in Discharge Instruction: cut to make foam donut to help offload Secured With [place] [facility] T ape, 2x2 (in/yd) Discharge Instruction: Secure dressing with tape as directed. Compression Wrap Compression Stockings Add-Ons Electronic Signature(s) Signed: 01/06/2022 3:01:08 PM By: Lorrin Jackson Entered By: Lorrin Jackson on 01/06/2022 13:00:06 -------------------------------------------------------------------------------- Vitals Details Patient Name: Date of Service: [name], [name] 01/06/2022 12:45 PM Medical Record Number: [id number] Patient Account Number: [id number] Date of Birth/Sex: Treating RN: 23-Aug-1971 (51 y.o. [name] Primary Care Paymon Rosensteel: [name] Other Clinician: Referring Damiyah Ditmars: Treating Elmond Poehlman/Extender: Delano Metz in Treatment: 3 Vital Signs Time Taken: 12:36 Temperature (F): 97.9 Height (in): 77 Pulse (bpm): 86 Weight (lbs): 232 Respiratory Rate (breaths/min): 18 Body Mass Index (BMI): 27.5 Blood Pressure (mmHg): 135/82 Capillary Blood Glucose (mg/dl): 210 Reference  Range: 80 - 120 mg / dl Electronic Signature(s) Signed:  01/06/2022 3:01:08 PM By: Lorrin Jackson Entered By: Lorrin Jackson on 01/06/2022 12:39:29

## 2022-01-09 ENCOUNTER — Ambulatory Visit (HOSPITAL_COMMUNITY)
Admission: RE | Admit: 2022-01-09 | Discharge: 2022-01-09 | Disposition: A | Discharge status: Home / Self Care | Payer: Medicare Other | Source: Ambulatory Visit | Attending: Nephrology | Admitting: Nephrology

## 2022-01-09 ENCOUNTER — Other Ambulatory Visit: Payer: Self-pay

## 2022-01-09 VITALS — BP 151/75 | HR 91 | Temp 97.5°F | Resp 20

## 2022-01-09 DIAGNOSIS — D62 Acute posthemorrhagic anemia: Secondary | ICD-10-CM | POA: Insufficient documentation

## 2022-01-09 DIAGNOSIS — D649 Anemia, unspecified: Secondary | ICD-10-CM | POA: Diagnosis present

## 2022-01-09 DIAGNOSIS — N179 Acute kidney failure, unspecified: Secondary | ICD-10-CM | POA: Diagnosis not present

## 2022-01-09 LAB — IRON AND TIBC
Iron: 45 ug/dL (ref 45–182)
Saturation Ratios: 18 % (ref 17.9–39.5)
TIBC: 255 ug/dL (ref 250–450)
UIBC: 210 ug/dL

## 2022-01-09 LAB — FERRITIN: Ferritin: 35 ng/mL (ref 24–336)

## 2022-01-09 LAB — POCT HEMOGLOBIN-HEMACUE: Hemoglobin: 9.6 g/dL — ABNORMAL LOW (ref 13.0–17.0)

## 2022-01-09 MED ORDER — EPOETIN ALFA-EPBX 10000 UNIT/ML IJ SOLN: 10000.0000 [IU] | INTRAMUSCULAR | Status: DC

## 2022-01-09 MED ORDER — EPOETIN ALFA-EPBX 10000 UNIT/ML IJ SOLN
INTRAMUSCULAR | Status: AC
  Administered 2022-01-09: 10000 [IU] via SUBCUTANEOUS
  Filled 2022-01-09: qty 1

## 2022-01-20 ENCOUNTER — Other Ambulatory Visit: Payer: Self-pay

## 2022-01-20 ENCOUNTER — Encounter (HOSPITAL_BASED_OUTPATIENT_CLINIC_OR_DEPARTMENT_OTHER): Payer: Medicare Other | Admitting: Internal Medicine

## 2022-01-20 DIAGNOSIS — L97522 Non-pressure chronic ulcer of other part of left foot with fat layer exposed: Secondary | ICD-10-CM

## 2022-01-20 NOTE — Progress Notes (Signed)
[name] [name], [name] [name] ([id number]) Visit Report for 01/20/2022 Chief Complaint Document Details Patient Name: Date of Service: [name] [name] 01/20/2022 12:45 PM Medical Record Number: [id number] Patient Account Number: [id number] Date of Birth/Sex: Treating RN: 03/30/1971 (51 y.o. M) Primary Care Provider: [name] Other Clinician: Referring Provider: Treating Provider/Extender: Delano Metz in Treatment: 85 Information Obtained from: Patient Chief Complaint patient is here for a review of the wound on his plantar left first metatarsal head and back of his right leg Electronic Signature(s) Signed: 01/20/2022 1:48:09 PM By: Kalman Shan DO Entered By: Kalman Shan on 01/20/2022 13:40:04 -------------------------------------------------------------------------------- Debridement Details Patient Name: Date of Service: [name] [name] [name], [name] 01/20/2022 12:45 PM Medical Record Number: [id number] Patient Account Number: [id number] Date of Birth/Sex: Treating RN: Aug 28, 1971 (51 y.o. [name], [name] Primary Care Provider: [name] Other Clinician: Referring Provider: Treating Provider/Extender: Delano Metz in Treatment: 74 Debridement Performed for Assessment: Wound #2R Left Metatarsal head first Performed By: Physician Kalman Shan, DO Debridement Type: Debridement Severity of Tissue Pre Debridement: Fat layer exposed Level of Consciousness (Pre-procedure): Awake and Alert Pre-procedure Verification/Time Out Yes - 13:25 Taken: Start Time: 13:26 Pain Control: Other : benzocaine 20% spray T Area Debrided (L x W): otal 2 (cm) x 2 (cm) = 4 (cm) Tissue and other material debrided: Viable, Non-Viable, Callus, Subcutaneous, Skin: Dermis , Skin: Epidermis, Fibrin/Exudate Level: Skin/Subcutaneous Tissue Debridement Description: Excisional Instrument: Blade, Curette, Forceps Bleeding: Minimum Hemostasis  Achieved: Pressure End Time: 13:32 Procedural Pain: 0 Post Procedural Pain: 0 Response to Treatment: Procedure was tolerated well Level of Consciousness (Post- Awake and Alert procedure): Post Debridement Measurements of Total Wound Length: (cm) 1 Width: (cm) 0.4 Depth: (cm) 0.6 Volume: (cm) 0.188 Character of Wound/Ulcer Post Debridement: Improved Severity of Tissue Post Debridement: Fat layer exposed Post Procedure Diagnosis Same as Pre-procedure Electronic Signature(s) Signed: 01/20/2022 1:48:09 PM By: Kalman Shan DO Signed: 01/20/2022 3:24:27 PM By: Deon Pilling RN, BSN Entered By: Deon Pilling on 01/20/2022 13:33:48 -------------------------------------------------------------------------------- HPI Details Patient Name: Date of Service: [name] [name] [name], [name] 01/20/2022 12:45 PM Medical Record Number: [id number] Patient Account Number: [id number] Date of Birth/Sex: Treating RN: 10-17-1971 (51 y.o. M) Primary Care Provider: [name] Other Clinician: Referring Provider: Treating Provider/Extender: Delano Metz in Treatment: 48 History of Present Illness HPI Description: 04/21/18 ADMISSION This is a 51 year old man who is a type II diabetic. In spite of fact his hemoglobin A1c is actually quite good 5.73 months ago he is at a really difficult time over the last 6-7 months. He developed a rapidly progressive infection in the right foot in November 2018 associated with osteomyelitis and necrotizing fasciitis. 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 The stump required and a revision on 11/24/17. The stump revision was advertised as being secondary to falls. I'm not sure the progressive history here however this area is actually closed over. The patient tells me over the same timeframe he has had wounds on the plantar aspect of his left foot. In the ED saw Dr. Sharol Given in April of this year. Noted that have Wagner grade 1 diabetic ulcers on the fifth  didn't first metatarsal heads. It is really not clear that this patient is been dressing this with anything. He came in with the clinic without any specific dressing on the wound areas using his own tennis shoe. He tells me that he only ambulates of course to do a pivot transfer. He does not  have his prosthesis for the right leg as of yet and he blames Medicaid for this. He does however use the foot to push himself along in his wheelchair at home. The patient has not had formal arterial studies. ABI in our clinic on the left was 1.13. Patient's past medical history includes type 2 diabetes, fracture of the right fibula. I see that he was treated for abscesses on his right buttock and chest in 2016. I did not look at the microbiology of this. 04/28/18; patient comes back in the clinic today with the wound pretty much the same as when he came in here last week. Small opening lots of undermining relatively. He tells [place] that nothing is really been on this for 3 days in spite of the fact that we gave him enough to dress this easily especially such a small wound. He says he lives with his mother, she is not capable of assisting with this he is changing the dressings himself. 05/05/18; much better-looking wound today. Smaller. There is some undermining medially however that's only perhaps 2 mm. No surrounding erythema. We've been using silver collagen 05/12/18; small wound on the first metatarsal head. No undermining no surrounding erythema. We've been using silver collagen he has home health coming out to change 05/20/18 the wound on the first metatarsal head looks better. Covered in surface debris/callus nonviable tissue. Required debridement but post debridement this looks quite good. We've been using silver collagen. He has home health 05/27/18; first metatarsal head wound continues to improve. Just about completely closed. Still a lot of surface debridement callus. We've been using silver collagen 06/06/18;  the first metatarsal wound is completely healed over. There is still a lot of callus. I gently removed some of this just to make sure that there was no open area and there is not. The patient mentions to me that his left leg has felt like "lead" for about a week READMISSION 08/16/2020 This is a 51 year old man with type 2 diabetes. He returns to clinic today with a 1 month history of a blister and callus over the first metatarsal head. This is in exactly the same area as when he was here in 2019. 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 He has standard running shoes on the left foot to match the area in his prosthesis. He has only been covering this area with a Band-Aid has not really been specifically dressing this. ABI in our clinic on the left was 1.16. This is essentially stable from the value in 2019 10/1 the patient has a linear wound over the left first metatarsal head. Again not a lot of callus thick skin around this that I removed with a #3 curette. This is not go deep to bone or muscle it does not look to be infected. 10/8 first metatarsal head. The wound looks like it is closing however this is going to be a difficult area to offload in the future. He has a size 15 shoe and he says his shoes are years old. The inserts in these current shoes are totally useless and I have advised him to replace these. He may need a new pair of shoes and I have he got original prosthesis at [facility] I have asked him to go back there. 11/9; the patient has not been here in more than a month. Not sure he was healed when he was here the last time. I  told him he would have to get new shoes and certainly offload the area over the left first metatarsal head. He has done nothing of the kind. He arrives back in clinic with the same old new balance sneakers. A very large separating callus over the first metatarsal head on the left 11/16; he has purchased new  shoes and has at least 2 insoles like I asked. The wound is measuring much smaller. He is using silver alginate he changes the dressing himself 11/30; wound is measurably smaller in length of about a half a centimeter. This is generally very little depth. We have been using silver alginate. He changes the dressing himself every second day. 12/14; wound is about the same size. Significant undermining medially relative to the size of the wound. We have been using silver alginate. There is not a way to offload this area as he walks with a prosthesis on the right leg 12/28; wound is about the same size. Again he has undermining medially. I am using polymen on this 12/02/2020; the wound looks smaller. Still a senescent edge. We have been using polymen 1/24; the wound is come down a few millimeters. Still a senescent edge. I have been using polymen 2/8; the wound is come down slightly. Still with slight undermining from 12-6 o'clock I have been using polymen partially to get offloading on this area which is opposed by his prosthesis on the other leg. 2/22 quite open improvement he still has a comma shaped wound on the left first metatarsal head. Some depth. Using polymen 3/14; he still has the same problem of a comma shaped area over the left first metatarsal head. This seems to have had more depth today at 0.6 cm. We have been using polymen. The patient changes this himself every second day. I explored the idea of a total contact cast on the left leg however this is his driving foot. He was not enamored with the idea of not being able to drive and states that he does not have anybody else to get him around 3/28; again he comes in with a smaller looking wound however there is depth and undermining requiring debridement. We have been using Hydrofera Blue, changed to silver collagen today. The patient is doing the dressing himself 4/11; patient presents today for follow-up of his left diabetic foot wound.  He denies any issues in the past 2 weeks. He is currently using silver collagen every other day with dressing changes. He does not use diabetic shoes or special inserts to his sneakers. He does not use felt pads for offloading. 4/19; patient presents for his 1 week follow-up. He denies any issues. He reports using Hydrofera Blue and has not been using silver collagen for dressing changes. He reports minimal drainage to the wound. He overall feels well. 5/3; patient presents for 2-week follow-up. He has been using silver collagen without issues. He has no complaints today and states he overall feels well. 5/17; patient presents for 2-week follow-up. He states that the sleeve is rubbing up against the back of his right leg and causing a wound. He states this happened a week ago and has not been dressing the wound. The plantar wound is stable and he has been using collagen every other day. 5/24; Patient was had a recent hospitalization for cellulitis of his right popliteal fossa. He was admitted and started on IV antibiotics. He does not recall the plantar wound dressed while inpatient. He feels a lot better today. He was discharged  on 5/21 on oral antibiotics and has not picked this up until today. He reports using collagen to the plantar wound since discharge. He is keeping the right posterior knee wound covered. He is getting a new sleeve for his prostatic on 5/26. He currently denies signs of infection. 6/1; patient presents for 1 week follow-up. He is still on doxycycline. He has not been dressing the back of the right knee wound. He states he is receiving his new prosthetic sleeve tomorrow. He states he is using silver alginate to the plantar foot wound. He currently denies signs of infection. He states he has not been offloading the left foot wound 6/7; patient presents for 1 week follow-up. He completes his last dose of doxycycline today. He finally received his prosthetic sleeve for the right  side and he states it feels much better. He has been using antibiotic ointment to the back of that right knee wound. He has been using silver alginate to the plantar foot wound. He currently denies signs of infection. 6/21; patient presents for 2-week follow-up. He reports no issues with his prosthetic sleeve. He has been using antibiotic ointment to the back of the right knee. He has been using silver alginate to the plantar foot wound. He has no issues or complaints today. He denies signs of infection. 7/5; patient presents for 2-week follow-up. He reports no issues or complaints today. He continues to use antibiotic ointment to the right posterior knee wound and silver alginate to the left plantar foot wound. He denies signs of infection. 7/19; patient presents for 2-week follow-up. He continues to use antibiotic ointment to the right posterior knee and silver alginate to the left plantar foot wound. He has no issues or complaints today. He denies signs of infection. 8/4; patient presents for 2-week follow-up. He has no issues or complaints today. He denies signs of infection. 8/18; patient presents for 2-week follow-up. He has no issues or concerns today. He has been approved for Apligraf and he would like to have this placed today. He denies signs of infection and reports that his right posterior knee wound is closed. 8/25; patient presents for 1 week follow-up. He had no issues with his first Apligraf placement last week. He did however develop an abscess to the back of his right knee. He reports tenderness to this area. He denies systemic signs of infection. 9/1; patient presents for 1 week follow-up. He has had no issues with his plantar foot wound. The Apligraf has stayed in place over the past week. A wound culture was done at last clinic visit and a new antibiotic was sent to the pharmacy. Patient has not received this yet. He reports some mild tenderness to the back of his right knee. He  denies systemic signs of infection. 9/8; patient presents for 1 week follow-up. He states he took Keflex and had diarrhea as a side effect. He was able to complete the course. He reports continued tenderness to the right posterior knee wound. He has some drainage still. He denies increased warmth or erythema to the area. He denies fever/chills or nausea/vomiting. He has no issues or complaints today about his plantar foot wound. 9/15; patient presents for follow-up. He reports improvement in pain to the back of his right leg. He does not report any drainage. He had an ultrasound completed on 9/9 that showed a superficial fluid collection concerning for abscess. We attempted to call patient with results with no answer. We did put an urgent referral to  general surgery and he reports he has not received a call. He currently denies systemic signs of infection. He reports no issues to his left plantar foot wound. 9/27; patient presents for follow-up. He has no issues or complaints today. He has had no issues with the previous wound to the back of his right leg. He has not made an appointment to see general surgery. He had Apligraf placed at last clinic visit to the plantar foot wound. He currently denies signs of infection. 10/7; patient presents for follow-up. Upon entering the room there is vomit throughout the entire floor. Patient states he does not feel well. I recommended he go to urgent care. I recommended following up next week for wound care. 10/10; patient presents for follow-up. He came last week for his follow-up however he was ill and started vomiting in the exam room. Today he states he feels better however he continues to have vomiting episodes. He states he attempted to go to urgent care/the ED but could not find a parking spot. He subsequently went home. He has no issues or complaints today regarding the foot wound. He never followed up with general surgery for the abscess identified on  ultrasound to the back of his right knee. 10/20; patient presents for follow-up. He has no issues or complaints today. Overall he feels well today. 10/27; patient presents for follow-up. He has no issues or complaints today. 11/3; patient presents for follow-up. He has no issues or complaints today. He states he does not want to follow-up with orthopedics to reevaluate the abscess in the back of his right leg. He denies signs of infection. 11/15; patient presents for follow-up. He has no issues or complaints today. He had been using silver alginate to the left first met head wound. He denies signs of infection. 11/22; patient presents for follow-up. He had Affinity placed at last clinic visit. He has no issues or complaints today. 11/29; patient presents for follow-up. He is in good spirits today. He has no issues or complaints today. 12/6; patient presents for follow-up. He has no issues or complaints today. 12/13; patient presents for follow-up. He denies signs of infection. He has no issues or complaints today. 12/20; patient presents for follow-up. He has no issues or complaints today. He denies signs of infection. 12/29; patient presents for follow-up. He states that a few days ago he has had reaccumulation of an abscess behind the right knee. He denies increased warmth or erythema to the area. He denies fever/chills or drainage. 1/6; patient presents for follow-up. He states he went to the [facility] emergency department to be evaluated for the abscess to his right posterior knee. He was started on clindamycin. They were unable to drain the abscess. Patient states that eventually the abscess ruptured. He reports improvement in his symptoms. He has no issues or complaints about his left foot wound. 1/17; patient presents for follow-up. He has no issues or complaints today. 1/24; patient presents for follow-up. He has no issues or complaints today. He denies signs of infection. 1/31; patient  presents for follow-up. He has been approved for Affinity however has a 20% co-pay. Patient has declined further Affinity treatments due to this. He currently denies signs of infection. 2/7; patient presents for follow-up. He has no issues or complaints today. He denies signs of infection. He has been doing Hydrofera Blue dressing changes. 2/14; patient presents for follow-up. He has no issues or complaints today. He has been using blast X with collagen to the  wound bed. He currently denies any issues. 2/28; patient presents for follow-up. He denies signs of infection. He reports using blast X with collagen to the wound bed. Electronic Signature(s) Signed: 01/20/2022 1:48:09 PM By: Kalman Shan DO Entered By: Kalman Shan on 01/20/2022 13:40:36 -------------------------------------------------------------------------------- Physical Exam Details Patient Name: Date of Service: [name] [name], [name] 01/20/2022 12:45 PM Medical Record Number: [id number] Patient Account Number: [id number] Date of Birth/Sex: Treating RN: [birth year] (51 y.o. M) Primary Care Provider: [name] Other Clinician: Referring Provider: Treating Provider/Extender: Delano Metz in Treatment: 49 Constitutional respirations regular, non-labored and within target range for patient.. Cardiovascular 2+ dorsalis pedis/posterior tibialis pulses. Psychiatric pleasant and cooperative. Notes Left leg: T the first met head there is an open slit like wound with granulation tissue and circumferential callus. No signs of infection. o Electronic Signature(s) Signed: 01/20/2022 1:48:09 PM By: Kalman Shan DO Entered By: Kalman Shan on 01/20/2022 13:41:01 -------------------------------------------------------------------------------- Physician Orders Details Patient Name: Date of Service: [name] [name] [name], [name] 01/20/2022 12:45 PM Medical Record Number: [id number] Patient Account  Number: [id number] Date of Birth/Sex: Treating RN: June 09, 1971 (51 y.o. [name] Primary Care Provider: [name] Other Clinician: Referring Provider: Treating Provider/Extender: Delano Metz in Treatment: 57 Verbal / Phone Orders: No Diagnosis Coding ICD-10 Coding Code Description [phone] Non-pressure chronic ulcer of other part of left foot with fat layer exposed E11.621 Type 2 diabetes mellitus with foot ulcer E11.42 Type 2 diabetes mellitus with diabetic polyneuropathy Z89.511 Acquired absence of right leg below knee Follow-up Appointments ppointment in 1 week. - with Dr. Heber [place] (Room 7, [place]) Return A Other: - urea cream and gentamicin ointment pick up at your pharmacy. apply to callous to left foot everyday with dressing changes. Edema Control - Lymphedema / SCD / Other Elevate legs to the level of the heart or above for 30 minutes daily and/or when sitting, a frequency of: - throughout the day Avoid standing for long periods of time. Off-Loading Other: - minimize walking on left foot related to pressure to wound. Aid in wound healing. Wound Treatment Wound #2R - Metatarsal head first Wound Laterality: Left Cleanser: Soap and Water 1 x Per Day/30 Days Discharge Instructions: May shower and wash wound with dial antibacterial soap and water prior to dressing change. Cleanser: Wound Cleanser (Generic) 1 x Per Day/30 Days Discharge Instructions: Cleanse the wound with wound cleanser prior to applying a clean dressing using gauze sponges, not tissue or cotton balls. Peri-Wound Care: 40% Urea Cream 1 x Per Day/30 Days Discharge Instructions: Apply to callous around wound daily Topical: Gentamicin 1 x Per Day/30 Days Discharge Instructions: apply directly to wound. apply Q-tip to apply directly into wound bed. Secondary Dressing: Woven Gauze Sponges 2x2 in (Generic) 1 x Per Day/30 Days Discharge Instructions: Apply over primary dressing as  directed. Secondary Dressing: Optifoam Non-Adhesive Dressing, 4x4 in (Generic) 1 x Per Day/30 Days Discharge Instructions: cut to make foam donut to help offload Secured With: Kerlix Roll Sterile, 4.5x3.1 (in/yd) (Generic) 1 x Per Day/30 Days Discharge Instructions: Secure with Kerlix as directed. Secured With: 68M Medipore H Soft Cloth Surgical Tape, 2x2 (in/yd) (Generic) 1 x Per Day/30 Days Discharge Instructions: Secure dressing with tape as directed. Patient Medications llergies: adhesive tape A Notifications Medication Indication Start End 01/20/2022 gentamicin DOSE 1 - topical 0.1 % cream - apply once daily to the wound bed 01/20/2022 urea DOSE 1 - topical 40 % cream - Apply 1 to 3 times daily  to callous Electronic Signature(s) Signed: 01/20/2022 1:48:09 PM By: Kalman Shan DO Previous Signature: 01/20/2022 1:44:12 PM Version By: Kalman Shan DO Entered By: Kalman Shan on 01/20/2022 13:44:44 -------------------------------------------------------------------------------- Problem List Details Patient Name: Date of Service: [name] [name] [name], [name] 01/20/2022 12:45 PM Medical Record Number: [id number] Patient Account Number: [id number] Date of Birth/Sex: Treating RN: 09/24/1971 (51 y.o. [name] Primary Care Provider: [name] Other Clinician: Referring Provider: Treating Provider/Extender: Delano Metz in Treatment: 81 Active Problems ICD-10 Encounter Code Description Active Date MDM Diagnosis [phone] Non-pressure chronic ulcer of other part of left foot with fat layer exposed 08/16/2020 No Yes E11.621 Type 2 diabetes mellitus with foot ulcer 08/16/2020 No Yes E11.42 Type 2 diabetes mellitus with diabetic polyneuropathy 08/16/2020 No Yes Z89.511 Acquired absence of right leg below knee 08/16/2020 No Yes Inactive Problems Resolved Problems ICD-10 Code Description Active Date Resolved Date S81.801D Unspecified open wound, right  lower leg, subsequent encounter 04/08/2021 04/08/2021 Electronic Signature(s) Signed: 01/20/2022 1:48:09 PM By: Kalman Shan DO Entered By: Kalman Shan on 01/20/2022 13:38:21 -------------------------------------------------------------------------------- Progress Note Details Patient Name: Date of Service: [name] [name] [name], [name] 01/20/2022 12:45 PM Medical Record Number: [id number] Patient Account Number: [id number] Date of Birth/Sex: Treating RN: 1971/08/11 (51 y.o. M) Primary Care Provider: [name] Other Clinician: Referring Provider: Treating Provider/Extender: Delano Metz in Treatment: 75 Subjective Chief Complaint Information obtained from Patient patient is here for a review of the wound on his plantar left first metatarsal head and back of his right leg History of Present Illness (HPI) 04/21/18 ADMISSION This is a 51 year old man who is a type II diabetic. In spite of fact his hemoglobin A1c is actually quite good 5.73 months ago he is at a really difficult time over the last 6-7 months. He developed a rapidly progressive infection in the right foot in November 2018 associated with osteomyelitis and necrotizing fasciitis. 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 The stump required and a revision on 11/24/17. The stump revision was advertised as being secondary to falls. I'm not sure the progressive history here however this area is actually closed over. The patient tells me over the same timeframe he has had wounds on the plantar aspect of his left foot. In the ED saw Dr. Sharol Given in April of this year. 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 It is really not clear that this patient is been dressing this with anything. He came in with the clinic without any specific dressing on the wound areas using his own tennis shoe. He tells me that he only ambulates of course to do a pivot transfer. He does not  have his prosthesis for the right leg as of yet and he blames Medicaid for this. He does however use the foot to push himself along in his wheelchair at home. The patient has not had formal arterial studies. ABI in our clinic on the left was 1.13. Patient's past medical history includes type 2 diabetes, fracture of the right fibula. I see that he was treated for abscesses on his right buttock and chest in 2016. I did not look at the microbiology of this. 04/28/18; patient comes back in the clinic today with the wound pretty much the same as when he came in here last week. Small opening lots of undermining relatively. He tells [place] that nothing is really been on this for 3 days in spite of the fact  that we gave him enough to dress this easily especially such a small wound. He says he lives with his mother, she is not capable of assisting with this he is changing the dressings himself. 05/05/18; much better-looking wound today. Smaller. There is some undermining medially however that's only perhaps 2 mm. No surrounding erythema. We've been using silver collagen 05/12/18; small wound on the first metatarsal head. No undermining no surrounding erythema. We've been using silver collagen he has home health coming out to change 05/20/18 the wound on the first metatarsal head looks better. Covered in surface debris/callus nonviable tissue. Required debridement but post debridement this looks quite good. We've been using silver collagen. He has home health 05/27/18; first metatarsal head wound continues to improve. Just about completely closed. Still a lot of surface debridement callus. We've been using silver collagen 06/06/18; the first metatarsal wound is completely healed over. There is still a lot of callus. I gently removed some of this just to make sure that there was no open area and there is not. The patient mentions to me that his left leg has felt like "lead" for about a week READMISSION 08/16/2020 This  is a 51 year old man with type 2 diabetes. He returns to clinic today with a 1 month history of a blister and callus over the first metatarsal head. This is in exactly the same area as when he was here in 2019. 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 He has standard running shoes on the left foot to match the area in his prosthesis. He has only been covering this area with a Band-Aid has not really been specifically dressing this. ABI in our clinic on the left was 1.16. This is essentially stable from the value in 2019 10/1 the patient has a linear wound over the left first metatarsal head. Again not a lot of callus thick skin around this that I removed with a #3 curette. This is not go deep to bone or muscle it does not look to be infected. 10/8 first metatarsal head. The wound looks like it is closing however this is going to be a difficult area to offload in the future. He has a size 15 shoe and he says his shoes are years old. The inserts in these current shoes are totally useless and I have advised him to replace these. He may need a new pair of shoes and I have he got original prosthesis at [facility] I have asked him to go back there. 11/9; the patient has not been here in more than a month. Not sure he was healed when he was here the last time. I told him he would have to get new shoes and certainly offload the area over the left first metatarsal head. He has done nothing of the kind. He arrives back in clinic with the same old new balance sneakers. A very large separating callus over the first metatarsal head on the left 11/16; he has purchased new shoes and has at least 2 insoles like I asked. The wound is measuring much smaller. He is using silver alginate he changes the dressing himself 11/30; wound is measurably smaller in length of about a half a centimeter. This is generally very little depth. We have been using silver alginate.  He changes the dressing himself every second day. 12/14; wound is about the same size. Significant undermining medially relative to the size of the  wound. We have been using silver alginate. There is not a way to offload this area as he walks with a prosthesis on the right leg 12/28; wound is about the same size. Again he has undermining medially. I am using polymen on this 12/02/2020; the wound looks smaller. Still a senescent edge. We have been using polymen 1/24; the wound is come down a few millimeters. Still a senescent edge. I have been using polymen 2/8; the wound is come down slightly. Still with slight undermining from 12-6 o'clock I have been using polymen partially to get offloading on this area which is opposed by his prosthesis on the other leg. 2/22 quite open improvement he still has a comma shaped wound on the left first metatarsal head. Some depth. Using polymen 3/14; he still has the same problem of a comma shaped area over the left first metatarsal head. This seems to have had more depth today at 0.6 cm. We have been using polymen. The patient changes this himself every second day. I explored the idea of a total contact cast on the left leg however this is his driving foot. He was not enamored with the idea of not being able to drive and states that he does not have anybody else to get him around 3/28; again he comes in with a smaller looking wound however there is depth and undermining requiring debridement. We have been using Hydrofera Blue, changed to silver collagen today. The patient is doing the dressing himself 4/11; patient presents today for follow-up of his left diabetic foot wound. He denies any issues in the past 2 weeks. He is currently using silver collagen every other day with dressing changes. He does not use diabetic shoes or special inserts to his sneakers. He does not use felt pads for offloading. 4/19; patient presents for his 1 week follow-up. He denies any  issues. He reports using Hydrofera Blue and has not been using silver collagen for dressing changes. He reports minimal drainage to the wound. He overall feels well. 5/3; patient presents for 2-week follow-up. He has been using silver collagen without issues. He has no complaints today and states he overall feels well. 5/17; patient presents for 2-week follow-up. He states that the sleeve is rubbing up against the back of his right leg and causing a wound. He states this happened a week ago and has not been dressing the wound. The plantar wound is stable and he has been using collagen every other day. 5/24; Patient was had a recent hospitalization for cellulitis of his right popliteal fossa. He was admitted and started on IV antibiotics. He does not recall the plantar wound dressed while inpatient. He feels a lot better today. He was discharged on 5/21 on oral antibiotics and has not picked this up until today. He reports using collagen to the plantar wound since discharge. He is keeping the right posterior knee wound covered. He is getting a new sleeve for his prostatic on 5/26. He currently denies signs of infection. 6/1; patient presents for 1 week follow-up. He is still on doxycycline. He has not been dressing the back of the right knee wound. He states he is receiving his new prosthetic sleeve tomorrow. He states he is using silver alginate to the plantar foot wound. He currently denies signs of infection. He states he has not been offloading the left foot wound 6/7; patient presents for 1 week follow-up. He completes his last dose of doxycycline today. He finally received his prosthetic sleeve  for the right side and he states it feels much better. He has been using antibiotic ointment to the back of that right knee wound. He has been using silver alginate to the plantar foot wound. He currently denies signs of infection. 6/21; patient presents for 2-week follow-up. He reports no issues with his  prosthetic sleeve. He has been using antibiotic ointment to the back of the right knee. He has been using silver alginate to the plantar foot wound. He has no issues or complaints today. He denies signs of infection. 7/5; patient presents for 2-week follow-up. He reports no issues or complaints today. He continues to use antibiotic ointment to the right posterior knee wound and silver alginate to the left plantar foot wound. He denies signs of infection. 7/19; patient presents for 2-week follow-up. He continues to use antibiotic ointment to the right posterior knee and silver alginate to the left plantar foot wound. He has no issues or complaints today. He denies signs of infection. 8/4; patient presents for 2-week follow-up. He has no issues or complaints today. He denies signs of infection. 8/18; patient presents for 2-week follow-up. He has no issues or concerns today. He has been approved for Apligraf and he would like to have this placed today. He denies signs of infection and reports that his right posterior knee wound is closed. 8/25; patient presents for 1 week follow-up. He had no issues with his first Apligraf placement last week. He did however develop an abscess to the back of his right knee. He reports tenderness to this area. He denies systemic signs of infection. 9/1; patient presents for 1 week follow-up. He has had no issues with his plantar foot wound. The Apligraf has stayed in place over the past week. A wound culture was done at last clinic visit and a new antibiotic was sent to the pharmacy. Patient has not received this yet. He reports some mild tenderness to the back of his right knee. He denies systemic signs of infection. 9/8; patient presents for 1 week follow-up. He states he took Keflex and had diarrhea as a side effect. He was able to complete the course. He reports continued tenderness to the right posterior knee wound. He has some drainage still. He denies increased  warmth or erythema to the area. He denies fever/chills or nausea/vomiting. He has no issues or complaints today about his plantar foot wound. 9/15; patient presents for follow-up. He reports improvement in pain to the back of his right leg. He does not report any drainage. He had an ultrasound completed on 9/9 that showed a superficial fluid collection concerning for abscess. We attempted to call patient with results with no answer. We did put an urgent referral to general surgery and he reports he has not received a call. He currently denies systemic signs of infection. He reports no issues to his left plantar foot wound. 9/27; patient presents for follow-up. He has no issues or complaints today. He has had no issues with the previous wound to the back of his right leg. He has not made an appointment to see general surgery. He had Apligraf placed at last clinic visit to the plantar foot wound. He currently denies signs of infection. 10/7; patient presents for follow-up. Upon entering the room there is vomit throughout the entire floor. Patient states he does not feel well. I recommended he go to urgent care. I recommended following up next week for wound care. 10/10; patient presents for follow-up. He came last week  for his follow-up however he was ill and started vomiting in the exam room. Today he states he feels better however he continues to have vomiting episodes. He states he attempted to go to urgent care/the ED but could not find a parking spot. He subsequently went home. He has no issues or complaints today regarding the foot wound. He never followed up with general surgery for the abscess identified on ultrasound to the back of his right knee. 10/20; patient presents for follow-up. He has no issues or complaints today. Overall he feels well today. 10/27; patient presents for follow-up. He has no issues or complaints today. 11/3; patient presents for follow-up. He has no issues or  complaints today. He states he does not want to follow-up with orthopedics to reevaluate the abscess in the back of his right leg. He denies signs of infection. 11/15; patient presents for follow-up. He has no issues or complaints today. He had been using silver alginate to the left first met head wound. He denies signs of infection. 11/22; patient presents for follow-up. He had Affinity placed at last clinic visit. He has no issues or complaints today. 11/29; patient presents for follow-up. He is in good spirits today. He has no issues or complaints today. 12/6; patient presents for follow-up. He has no issues or complaints today. 12/13; patient presents for follow-up. He denies signs of infection. He has no issues or complaints today. 12/20; patient presents for follow-up. He has no issues or complaints today. He denies signs of infection. 12/29; patient presents for follow-up. He states that a few days ago he has had reaccumulation of an abscess behind the right knee. He denies increased warmth or erythema to the area. He denies fever/chills or drainage. 1/6; patient presents for follow-up. He states he went to the [facility] emergency department to be evaluated for the abscess to his right posterior knee. He was started on clindamycin. They were unable to drain the abscess. Patient states that eventually the abscess ruptured. He reports improvement in his symptoms. He has no issues or complaints about his left foot wound. 1/17; patient presents for follow-up. He has no issues or complaints today. 1/24; patient presents for follow-up. He has no issues or complaints today. He denies signs of infection. 1/31; patient presents for follow-up. He has been approved for Affinity however has a 20% co-pay. Patient has declined further Affinity treatments due to this. He currently denies signs of infection. 2/7; patient presents for follow-up. He has no issues or complaints today. He denies signs of  infection. He has been doing Hydrofera Blue dressing changes. 2/14; patient presents for follow-up. He has no issues or complaints today. He has been using blast X with collagen to the wound bed. He currently denies any issues. 2/28; patient presents for follow-up. He denies signs of infection. He reports using blast X with collagen to the wound bed. Patient History Information obtained from Patient. Family History Diabetes - Maternal Grandparents,Mother, Heart Disease - Father, Hypertension - Father, No family history of Cancer, Hereditary Spherocytosis, Kidney Disease, Lung Disease, Seizures, Stroke, Thyroid Problems, Tuberculosis. Social History Current every day smoker, Marital Status - Single, Alcohol Use - Never, Drug Use - No History, Caffeine Use - Never. Medical History Eyes Denies history of Cataracts, Glaucoma, Optic Neuritis Ear/Nose/Mouth/Throat Denies history of Chronic sinus problems/congestion, Middle ear problems Hematologic/Lymphatic Patient has history of Anemia Denies history of Hemophilia, Human Immunodeficiency Virus, Lymphedema, Sickle Cell Disease Respiratory Denies history of Aspiration, Asthma, Chronic Obstructive Pulmonary Disease (  COPD), Pneumothorax, Sleep Apnea, Tuberculosis Cardiovascular Patient has history of Hypertension, Peripheral Venous Disease Denies history of Angina, Arrhythmia, Congestive Heart Failure, Coronary Artery Disease, Deep Vein Thrombosis, Hypotension, Myocardial Infarction, Peripheral Arterial Disease, Phlebitis, Vasculitis Gastrointestinal Denies history of Cirrhosis , Colitis, Crohnoos, Hepatitis A, Hepatitis B, Hepatitis C Endocrine Patient has history of Type II Diabetes - oral meds Denies history of Type I Diabetes Genitourinary Denies history of End Stage Renal Disease Immunological Denies history of Lupus Erythematosus, Raynaudoos, Scleroderma Integumentary (Skin) Denies history of History of Burn Musculoskeletal Denies  history of Gout, Rheumatoid Arthritis, Osteoarthritis, Osteomyelitis Neurologic Denies history of Dementia, Neuropathy, Quadriplegia, Paraplegia, Seizure Disorder Oncologic Denies history of Received Chemotherapy, Received Radiation Psychiatric Denies history of Anorexia/bulimia, Confinement Anxiety Hospitalization/Surgery History - fever 105, sepsis cellulitis right popliteal fossa 04/09/2021. Medical A Surgical History Notes [name] Constitutional Symptoms (General Health) falls , leukocytosis Respiratory During waking hours and catches himself not breathing, "gasp for air". Saw Dr Wynonia Lawman, he couldn't find a reason Objective Constitutional respirations regular, non-labored and within target range for patient.. Vitals Time Taken: 12:50 PM, Height: 77 in, Weight: 232 lbs, BMI: 27.5, Temperature: 97.8 F, Pulse: 87 bpm, Respiratory Rate: 20 breaths/min, Blood Pressure: 127/74 mmHg, Capillary Blood Glucose: 179 mg/dl. General Notes: per patient edema in entire body. Notice hands, feet, and legs. Per patient PCP ordered tests to be done today. Cardiovascular 2+ dorsalis pedis/posterior tibialis pulses. Psychiatric pleasant and cooperative. General Notes: Left leg: T the first met head there is an open slit like wound with granulation tissue and circumferential callus. No signs of infection. o Integumentary (Hair, Skin) Wound #2R status is Open. Original cause of wound was Blister. The date acquired was: 06/23/2020. The wound has been in treatment 74 weeks. The wound is located on the Left Metatarsal head first. The wound measures 1cm length x 0.4cm width x 1cm depth; 0.314cm^2 area and 0.314cm^3 volume. There is Fat Layer (Subcutaneous Tissue) exposed. There is no tunneling noted, however, there is undermining starting at 12:00 and ending at 12:00 with a maximum distance of 0.6cm. There is a medium amount of serosanguineous drainage noted. The wound margin is distinct with the outline attached to  the wound base. There is no granulation within the wound bed. There is no necrotic tissue within the wound bed. General Notes: callous noted to wound and periwound. 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 There is significant callus and I attempted to debride this today. I recommended using urea cream to the hardened areas. I debrided nonviable tissue to the wound bed. He did not have a dressing on today and it is unclear if he is actually doing dressing changes. At this time I recommended switching to gentamicin ointment daily and aggressive offloading. No surrounding signs of infection. Follow-up in 1 week. Procedures Wound #2R Pre-procedure diagnosis of Wound #2R is a Diabetic Wound/Ulcer of the Lower Extremity located on the Left Metatarsal head first .Severity of Tissue Pre Debridement is: Fat layer exposed. There was a Excisional Skin/Subcutaneous Tissue Debridement with a total area of 4 sq cm performed by Kalman Shan, DO. With the following instrument(s): Blade, Curette, and Forceps to remove Viable and Non-Viable tissue/material. Material removed includes Callus, Subcutaneous Tissue, Skin: Dermis, Skin: Epidermis, and Fibrin/Exudate after achieving pain control using Other (benzocaine 20% spray).  A time out was conducted at 13:25, prior to the start of the procedure. A Minimum amount of bleeding was controlled with Pressure. The procedure was tolerated well with a pain level of 0 throughout and a pain level of 0 following the procedure. Post Debridement Measurements: 1cm length x 0.4cm width x 0.6cm depth; 0.188cm^3 volume. Character of Wound/Ulcer Post Debridement is improved. Severity of Tissue Post Debridement is: Fat layer exposed. Post procedure  Diagnosis Wound #2R: Same as Pre-Procedure Plan Follow-up Appointments: Return Appointment in 1 week. - with Dr. Heber Halsey (Room 7, [place] [place]) Other: - urea cream and gentamicin ointment pick up at your pharmacy. apply to callous to left foot everyday with dressing changes. Edema Control - Lymphedema / SCD / Other: Elevate legs to the level of the heart or above for 30 minutes daily and/or when sitting, a frequency of: - throughout the day Avoid standing for long periods of time. Off-Loading: Other: - minimize walking on left foot related to pressure to wound. Aid in wound healing. The following medication(s) was prescribed: gentamicin topical 0.1 % cream 1 apply once daily to the wound bed starting 01/20/2022 urea topical 40 % cream 1 Apply 1 to 3 times daily to callous starting 01/20/2022 WOUND #2R: - Metatarsal head first Wound Laterality: Left Cleanser: Soap and Water 1 x Per Day/30 Days Discharge Instructions: May shower and wash wound with dial antibacterial soap and water prior to dressing change. Cleanser: Wound Cleanser (Generic) 1 x Per Day/30 Days Discharge Instructions: Cleanse the wound with wound cleanser prior to applying a clean dressing using gauze sponges, not tissue or cotton balls. Peri-Wound Care: 40% Urea Cream 1 x Per Day/30 Days Discharge Instructions: Apply to callous around wound daily Topical: Gentamicin 1 x Per Day/30 Days Discharge Instructions: apply directly to wound. apply Q-tip to apply directly into wound bed. Secondary Dressing: Woven Gauze Sponges 2x2 in (Generic) 1 x Per Day/30 Days Discharge Instructions: Apply over primary dressing as directed. Secondary Dressing: Optifoam Non-Adhesive Dressing, 4x4 in (Generic) 1 x Per Day/30 Days Discharge Instructions: cut to make foam donut to help offload Secured With: Kerlix Roll Sterile, 4.5x3.1 (in/yd) (Generic) 1 x Per Day/30 Days Discharge Instructions: Secure with Kerlix as directed. Secured With: 61M Medipore H Soft  Cloth Surgical T ape, 2x2 (in/yd) (Generic) 1 x Per Day/30 Days Discharge Instructions: Secure dressing with tape as directed. 1. In office sharp debridement 2. Gentamicin ointment 3. Urea cream 4. Aggressive offloading 5. Follow-up in 1 week Electronic Signature(s) Signed: 01/20/2022 1:48:09 PM By: Kalman Shan DO Entered By: Kalman Shan on 01/20/2022 13:47:35 -------------------------------------------------------------------------------- HxROS Details Patient Name: Date of Service: [name] [name] [name], [name] 01/20/2022 12:45 PM Medical Record Number: [id number] Patient Account Number: [id number] Date of Birth/Sex: Treating RN: 1971-06-13 (51 y.o. M) Primary Care Provider: [name] Other Clinician: Referring Provider: Treating Provider/Extender: Delano Metz in Treatment: 9 Information Obtained From Patient Constitutional Symptoms ([place]) Medical History: Past Medical History Notes: falls , leukocytosis Eyes Medical History: Negative for: Cataracts; Glaucoma; Optic Neuritis Ear/Nose/Mouth/Throat Medical History: Negative for: Chronic sinus problems/congestion; Middle ear problems Hematologic/Lymphatic Medical History: Positive for: Anemia Negative for: Hemophilia; Human Immunodeficiency Virus; Lymphedema; Sickle Cell Disease Respiratory Medical History: Negative for: Aspiration; Asthma; Chronic Obstructive Pulmonary Disease (COPD); Pneumothorax; Sleep Apnea; Tuberculosis Past Medical History Notes: During waking hours and catches himself not breathing, "gasp for air". Saw Dr Wynonia Lawman, he couldn't find a reason Cardiovascular Medical History: Positive for: Hypertension; Peripheral Venous Disease Negative for: Angina; Arrhythmia;  Congestive Heart Failure; Coronary Artery Disease; Deep Vein Thrombosis; Hypotension; Myocardial Infarction; Peripheral Arterial Disease; Phlebitis; Vasculitis Gastrointestinal Medical  History: Negative for: Cirrhosis ; Colitis; Crohns; Hepatitis A; Hepatitis B; Hepatitis C Endocrine Medical History: Positive for: Type II Diabetes - oral meds Negative for: Type I Diabetes Time with diabetes: 2014 Treated with: Oral agents Blood sugar tested every day: Yes Tested : Blood sugar testing results: Breakfast: 91 Genitourinary Medical History: Negative for: End Stage Renal Disease Immunological Medical History: Negative for: Lupus Erythematosus; Raynauds; Scleroderma Integumentary (Skin) Medical History: Negative for: History of Burn Musculoskeletal Medical History: Negative for: Gout; Rheumatoid Arthritis; Osteoarthritis; Osteomyelitis Neurologic Medical History: Negative for: Dementia; Neuropathy; Quadriplegia; Paraplegia; Seizure Disorder Oncologic Medical History: Negative for: Received Chemotherapy; Received Radiation Psychiatric Medical History: Negative for: Anorexia/bulimia; Confinement Anxiety Immunizations Pneumococcal Vaccine: Received Pneumococcal Vaccination: No Implantable Devices No devices added Hospitalization / Surgery History Type of Hospitalization/Surgery fever 105, sepsis cellulitis right popliteal fossa 04/09/2021 Family and Social History Cancer: No; Diabetes: Yes - Maternal Grandparents,Mother; Heart Disease: Yes - Father; Hereditary Spherocytosis: No; Hypertension: Yes - Father; Kidney Disease: No; Lung Disease: No; Seizures: No; Stroke: No; Thyroid Problems: No; Tuberculosis: No; Current every day smoker; Marital Status - Single; Alcohol Use: Never; Drug Use: No History; Caffeine Use: Never; Financial Concerns: No; Food, Clothing or Shelter Needs: No; Support System Lacking: No; Transportation Concerns: No Electronic Signature(s) Signed: 01/20/2022 1:48:09 PM By: Kalman Shan DO Entered By: Kalman Shan on 01/20/2022 13:40:44 -------------------------------------------------------------------------------- SuperBill  Details Patient Name: Date of Service: [name] [name] [name], Harney. 01/20/2022 Medical Record Number: [id number] Patient Account Number: [id number] Date of Birth/Sex: Treating RN: February 12, 1971 (51 y.o. 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

## 2022-01-21 NOTE — Progress Notes (Signed)
EION, TIMBROOK ([id number]) Visit Report for 01/20/2022 Arrival Information Details Patient Name: Date of Service: [name] [name] 01/20/2022 12:45 PM Medical Record Number: [id number] Patient Account Number: [id number] Date of Birth/Sex: Treating RN: November 27, 1970 (51 y.o. [name] [name], [name] [name] Primary Care Jessen Siegman: [name] Other Clinician: Referring Brendon Christoffel: Treating Eaden Hettinger/Extender: Delano Metz in Treatment: 68 Visit Information History Since Last Visit Added or deleted any medications: No Patient Arrived: Ambulatory Any new allergies or adverse reactions: No Arrival Time: 12:50 Had a fall or experienced change in No Accompanied By: self activities of daily living that may affect Transfer Assistance: None risk of falls: Patient Identification Verified: Yes Signs or symptoms of abuse/neglect since last visito No Secondary Verification Process Completed: Yes Hospitalized since last visit: No Patient Requires Transmission-Based Precautions: No Implantable device outside of the clinic excluding No Patient Has Alerts: No cellular tissue based products placed in the center since last visit: Has Dressing in Place as Prescribed: Yes Pain Present Now: Yes Electronic Signature(s) Signed: 01/20/2022 3:24:27 PM By: Deon Pilling RN, BSN Entered By: Deon Pilling on 01/20/2022 12:50:31 -------------------------------------------------------------------------------- Encounter Discharge Information Details Patient Name: Date of Service: [name] [name], [name] 01/20/2022 12:45 PM Medical Record Number: [id number] Patient Account Number: [id number] Date of Birth/Sex: Treating RN: [birth year] (51 y.o. [name] [name] Primary Care Kendale Rembold: [name] Other Clinician: Referring Vitaliy Eisenhour: Treating Rashawnda Gaba/Extender: Delano Metz in Treatment: 86 Encounter Discharge Information Items Post Procedure Vitals Discharge  Condition: Stable Temperature (F): 97.8 Ambulatory Status: Ambulatory Pulse (bpm): 87 Discharge Destination: Home Respiratory Rate (breaths/min): 20 Transportation: Other Blood Pressure (mmHg): 127/74 Accompanied By: self Schedule Follow-up Appointment: Yes Clinical Summary of Care: Electronic Signature(s) Signed: 01/20/2022 3:24:27 PM By: Deon Pilling RN, BSN Entered By: Deon Pilling on 01/20/2022 13:34:40 -------------------------------------------------------------------------------- Lower Extremity Assessment Details Patient Name: Date of Service: [name] [name], [name] 01/20/2022 12:45 PM Medical Record Number: [id number] Patient Account Number: [id number] Date of Birth/Sex: Treating RN: 1971/02/26 (51 y.o. [name] [name] Primary Care Jasen Hartstein: [name] Other Clinician: Referring Kyce Ging: Treating Daneshia Tavano/Extender: Delano Metz in Treatment: 44 Edema Assessment Assessed: [name]: Yes] [name]: No] Edema: [Left: Ye] [Right: s] Calf Left: Right: Point of Measurement: 50 cm From Medial Instep 45.5 cm Ankle Left: Right: Point of Measurement: 11 cm From Medial Instep 27 cm Vascular Assessment Pulses: Dorsalis Pedis Palpable: [Left:Yes] Electronic Signature(s) Signed: 01/20/2022 3:24:27 PM By: Deon Pilling RN, BSN Entered By: Deon Pilling on 01/20/2022 12:54:34 -------------------------------------------------------------------------------- Multi Wound Chart Details Patient Name: Date of Service: [name] [name], [name] 01/20/2022 12:45 PM Medical Record Number: [id number] Patient Account Number: [id number] Date of Birth/Sex: Treating RN: [birth year] (51 y.o. M) Primary Care Colt Martelle: [name] Other Clinician: Referring Perry Brucato: Treating Tymara Saur/Extender: Delano Metz in Treatment: 28 Vital Signs Height(in): 77 Capillary Blood Glucose(mg/dl): 179 Weight(lbs): 232 Pulse(bpm): 87 Body Mass  Index(BMI): 27.5 Blood Pressure(mmHg): 127/74 Temperature(F): 97.8 Respiratory Rate(breaths/min): 20 Photos: [2R:Left Metatarsal head first] [N/A:N/A N/A] Wound Location: [2R:Blister] [N/A:N/A] Wounding Event: [2R:Diabetic Wound/Ulcer of the Lower] [N/A:N/A] Primary Etiology: [2R:Extremity Anemia, Hypertension, Peripheral] [N/A:N/A] Comorbid History: [2R:Venous Disease, Type II Diabetes 06/23/2020] [N/A:N/A] Date Acquired: [2R:74] [N/A:N/A] Weeks of Treatment: [2R:Open] [N/A:N/A] Wound Status: [2R:Yes] [N/A:N/A] Wound Recurrence: [2R:1x0.4x1] [N/A:N/A] Measurements L x W x D (cm) [2R:0.314] [N/A:N/A] A (cm) : rea [3M:6.294] [N/A:N/A] Volume (cm) : [2R:-912.90%] [N/A:N/A] % Reduction in A [2R:rea: -10366.70%] [N/A:N/A] % Reduction in Volume: [2R:12] Starting Position 1 (o'clock): [2R:12] Ending Position 1 (o'clock): [  2R:0.6] Maximum Distance 1 (cm): [2R:Yes] [N/A:N/A] Undermining: [2R:Grade 2] [N/A:N/A] Classification: [2R:Medium] [N/A:N/A] Exudate A mount: [2R:Serosanguineous] [N/A:N/A] Exudate Type: [2R:red, brown] [N/A:N/A] Exudate Color: [2R:Distinct, outline attached] [N/A:N/A] Wound Margin: [2R:None Present (0%)] [N/A:N/A] Granulation A mount: [2R:None Present (0%)] [N/A:N/A] Necrotic A mount: [2R:Fat Layer (Subcutaneous Tissue): Yes N/A] Exposed Structures: [2R:Fascia: No Tendon: No Muscle: No Joint: No Bone: No None] [N/A:N/A] Epithelialization: [2R:Debridement - Excisional] [N/A:N/A] Debridement: Pre-procedure Verification/Time Out 13:25 [N/A:N/A] Taken: [2R:Other] [N/A:N/A] Pain Control: [2R:Callus, Subcutaneous] [N/A:N/A] Tissue Debrided: [2R:Skin/Subcutaneous Tissue] [N/A:N/A] Level: [2R:4] [N/A:N/A] Debridement A (sq cm): [2R:rea Blade, Curette, Forceps] [N/A:N/A] Instrument: [2R:Minimum] [N/A:N/A] Bleeding: [2R:Pressure] [N/A:N/A] Hemostasis A chieved: [2R:0] [N/A:N/A] Procedural Pain: [2R:0] [N/A:N/A] Post Procedural Pain: [2R:Procedure was tolerated well]  [N/A:N/A] Debridement Treatment Response: [2R:1x0.4x0.6] [N/A:N/A] Post Debridement Measurements L x W x D (cm) [2R:0.188] [N/A:N/A] Post Debridement Volume: (cm) [2R:callous noted to wound and periwound. N/A] Assessment Notes: [2R:Debridement] [N/A:N/A] Treatment Notes Wound #2R (Metatarsal head first) Wound Laterality: Left Cleanser Soap and Water Discharge Instruction: May shower and wash wound with dial antibacterial soap and water prior to dressing change. Wound Cleanser Discharge Instruction: Cleanse the wound with wound cleanser prior to applying a clean dressing using gauze sponges, not tissue or cotton balls. Peri-Wound Care 40% Urea Cream Discharge Instruction: Apply to callous around wound daily Topical Gentamicin Discharge Instruction: apply directly to wound. apply Q-tip to apply directly into wound bed. Primary Dressing Secondary Dressing Woven Gauze Sponges 2x2 in Discharge Instruction: Apply over primary dressing as directed. Optifoam Non-Adhesive Dressing, 4x4 in Discharge Instruction: cut to make foam donut to help offload Secured With [employer] Sterile, 4.5x3.1 (in/yd) Discharge Instruction: Secure with Kerlix as directed. 15M Medipore H Soft Cloth Surgical T ape, 2x2 (in/yd) Discharge Instruction: Secure dressing with tape as directed. Compression Wrap Compression Stockings Add-Ons Electronic Signature(s) Signed: 01/20/2022 1:48:09 PM By: Kalman Shan DO Entered By: Kalman Shan on 01/20/2022 13:39:53 -------------------------------------------------------------------------------- Multi-Disciplinary Care Plan Details Patient Name: Date of Service: [name] [name], [name] 01/20/2022 12:45 PM Medical Record Number: [id number] Patient Account Number: [id number] Date of Birth/Sex: Treating RN: 1970/12/01 (51 y.o. [name] [name] Primary Care : [name] Other Clinician: Referring : Treating /Extender: Delano Metz in Treatment: 68 [place] reviewed with physician Active Inactive Abuse / Safety / Falls / Self Care Management Nursing Diagnoses: History of Falls Goals: Patient/caregiver will verbalize/demonstrate measures taken to prevent injury and/or falls Date Initiated: 11/04/2021 Target Resolution Date: 02/10/2022 Goal Status: Active Interventions: Assess fall risk on admission and as needed Assess impairment of mobility on admission and as needed per policy Notes: Wound/Skin Impairment Nursing Diagnoses: Impaired tissue integrity Goals: Patient/caregiver will verbalize understanding of skin care regimen Date Initiated: 11/19/2020 Target Resolution Date: 02/10/2022 Goal Status: Active Ulcer/skin breakdown will have a volume reduction of 30% by week 4 Date Initiated: 08/16/2020 Date Inactivated: 03/03/2021 Target Resolution Date: 03/10/2021 Goal Status: Met Interventions: Provide education on ulcer and skin care Notes: 07/24/21: Wound care regimen ongoing. Electronic Signature(s) Signed: 01/20/2022 3:24:27 PM By: Deon Pilling RN, BSN Entered By: Deon Pilling on 01/20/2022 12:57:54 -------------------------------------------------------------------------------- Pain Assessment Details Patient Name: Date of Service: [name] [name], [name] 01/20/2022 12:45 PM Medical Record Number: [id number] Patient Account Number: [id number] Date of Birth/Sex: Treating RN: 08/07/1971 (51 y.o. [name] [name] Primary Care : [name] Other Clinician: Referring : Treating /Extender: Delano Metz in Treatment: 9 Active Problems Location of Pain Severity and Description of Pain Patient Has Paino Yes  Site Locations Rate the pain. Current Pain Level: 7 Character of Pain Describe the Pain: Aching, Heavy, Sharp Pain Management and Medication Current Pain Management: Medication: No Cold  Application: No Rest: No Massage: No Activity: No T.E.N.S.: No Heat Application: No Leg drop or elevation: No Is the Current Pain Management Adequate: Adequate How does your wound impact your activities of daily livingo Sleep: No Bathing: No Appetite: No Relationship With Others: No Bladder Continence: No Emotions: No Bowel Continence: No Work: No Toileting: No Drive: No Dressing: No Hobbies: No Engineer, maintenance) Signed: 01/20/2022 3:24:27 PM By: Deon Pilling RN, BSN Entered By: Deon Pilling on 01/20/2022 12:50:54 -------------------------------------------------------------------------------- Patient/Caregiver Education Details Patient Name: Date of Service: [name] [name] 2/28/2023andnbsp12:45 PM Medical Record Number: [id number] Patient Account Number: [id number] Date of Birth/Gender: Treating RN: 1971/08/05 (51 y.o. [name] [name] Primary Care Physician: [name] Other Clinician: Referring Physician: Treating Physician/Extender: Delano Metz in Treatment: 45 Education Assessment Education Provided To: Patient Education Topics Provided Wound/Skin Impairment: Handouts: Skin Care Do's and Dont's Methods: Explain/Verbal Responses: Reinforcements needed Electronic Signature(s) Signed: 01/20/2022 3:24:27 PM By: Deon Pilling RN, BSN Entered By: Deon Pilling on 01/20/2022 12:58:20 -------------------------------------------------------------------------------- Wound Assessment Details Patient Name: Date of Service: [name] [name], [name] 01/20/2022 12:45 PM Medical Record Number: [id number] Patient Account Number: [id number] Date of Birth/Sex: Treating RN: December 05, 1970 (51 y.o. [name] [name], [name] [name] Primary Care : [name] Other Clinician: Referring : Treating /Extender: Delano Metz in Treatment: 77 Wound Status Wound Number: 2R Primary Diabetic Wound/Ulcer of  the Lower Extremity Etiology: Wound Location: Left Metatarsal head first Wound Status: Open Wounding Event: Blister Comorbid Anemia, Hypertension, Peripheral Venous Disease, Type II Date Acquired: 06/23/2020 History: Diabetes Weeks Of Treatment: Apr 17, 1973 Clustered Wound: No Photos Wound Measurements Length: (cm) 1 Width: (cm) 0.4 Depth: (cm) 1 Area: (cm) 0.314 Volume: (cm) 0.314 Wound Description Classification: Grade 2 Wound Margin: Distinct, outline attached Exudate Amount: Medium Exudate Type: Serosanguineous Exudate Color: red, brown Foul Odor After Cleansing: Slough/Fibrino % Reduction in Area: -912.9% % Reduction in Volume: -10366.7% Epithelialization: None Tunneling: No Undermining: Yes Starting Position (o'clock): 12 Ending Position (o'clock): 12 Maximum Distance: (cm) 0.6 No No Wound Bed Granulation Amount: None Present (0%) Exposed Structure Necrotic Amount: None Present (0%) Fascia Exposed: No Fat Layer (Subcutaneous Tissue) Exposed: Yes Tendon Exposed: No Muscle Exposed: No Joint Exposed: No Bone Exposed: No Assessment Notes callous noted to wound and periwound. Treatment Notes Wound #2R (Metatarsal head first) Wound Laterality: Left Cleanser Soap and Water Discharge Instruction: May shower and wash wound with dial antibacterial soap and water prior to dressing change. Wound Cleanser Discharge Instruction: Cleanse the wound with wound cleanser prior to applying a clean dressing using gauze sponges, not tissue or cotton balls. Peri-Wound Care 40% Urea Cream Discharge Instruction: Apply to callous around wound daily Topical Gentamicin Discharge Instruction: apply directly to wound. apply Q-tip to apply directly into wound bed. Primary Dressing Secondary Dressing Woven Gauze Sponges 2x2 in Discharge Instruction: Apply over primary dressing as directed. Optifoam Non-Adhesive Dressing, 4x4 in Discharge Instruction: cut to make foam donut to help  offload Secured With [employer] Sterile, 4.5x3.1 (in/yd) Discharge Instruction: Secure with Kerlix as directed. 74M Medipore H Soft Cloth Surgical T ape, 2x2 (in/yd) Discharge Instruction: Secure dressing with tape as directed. Compression Wrap Compression Stockings Add-Ons Electronic Signature(s) Signed: 01/20/2022 3:24:27 PM By: Deon Pilling RN, BSN Signed: 01/21/2022 7:56:47 AM By: Sandre Kitty Entered By: Sandre Kitty on 01/20/2022 12:57:52 --------------------------------------------------------------------------------  Vitals Details Patient Name: Date of Service: [name] [name] 01/20/2022 12:45 PM Medical Record Number: [id number] Patient Account Number: [id number] Date of Birth/Sex: Treating RN: 01-Oct-1971 (50 y.o. [name] [name] Primary Care : Other Clinician: [name] Referring : Treating /Extender: Delano Metz in Treatment: 68 Vital Signs Time Taken: 12:50 Temperature (F): 97.8 Height (in): 77 Pulse (bpm): 87 Weight (lbs): 232 Respiratory Rate (breaths/min): 20 Body Mass Index (BMI): 27.5 Blood Pressure (mmHg): 127/74 Capillary Blood Glucose (mg/dl): 179 Reference Range: 80 - 120 mg / dl Notes per patient edema in entire body. Notice hands, feet, and legs. Per patient PCP ordered tests to be done today. Electronic Signature(s) Signed: 01/20/2022 3:24:27 PM By: Deon Pilling RN, BSN Entered By: Deon Pilling on 01/20/2022 12:52:42

## 2022-01-23 ENCOUNTER — Telehealth: Payer: Self-pay | Admitting: Endocrinology

## 2022-01-23 NOTE — Telephone Encounter (Signed)
please contact patient: ?Need f/u appt here next avail ?

## 2022-01-26 NOTE — Telephone Encounter (Signed)
Attempted to contact the patient to schedule f/u appt, received no answer. LVM for a call back.  ?

## 2022-01-27 ENCOUNTER — Encounter (HOSPITAL_BASED_OUTPATIENT_CLINIC_OR_DEPARTMENT_OTHER): Payer: Medicare Other | Attending: Internal Medicine | Admitting: Internal Medicine

## 2022-01-27 ENCOUNTER — Other Ambulatory Visit: Payer: Self-pay

## 2022-01-27 DIAGNOSIS — F17200 Nicotine dependence, unspecified, uncomplicated: Secondary | ICD-10-CM | POA: Insufficient documentation

## 2022-01-27 DIAGNOSIS — E1142 Type 2 diabetes mellitus with diabetic polyneuropathy: Secondary | ICD-10-CM | POA: Diagnosis not present

## 2022-01-27 DIAGNOSIS — Z89511 Acquired absence of right leg below knee: Secondary | ICD-10-CM | POA: Diagnosis not present

## 2022-01-27 DIAGNOSIS — L97522 Non-pressure chronic ulcer of other part of left foot with fat layer exposed: Secondary | ICD-10-CM | POA: Insufficient documentation

## 2022-01-27 DIAGNOSIS — E11621 Type 2 diabetes mellitus with foot ulcer: Secondary | ICD-10-CM | POA: Diagnosis not present

## 2022-01-27 DIAGNOSIS — L02415 Cutaneous abscess of right lower limb: Secondary | ICD-10-CM | POA: Diagnosis not present

## 2022-01-27 DIAGNOSIS — I10 Essential (primary) hypertension: Secondary | ICD-10-CM | POA: Diagnosis not present

## 2022-01-27 NOTE — Progress Notes (Signed)
KAHNE, HELFAND ([id number]) Visit Report for 01/27/2022 Chief Complaint Document Details Patient Name: Date of Service: [name] [name] 01/27/2022 12:30 PM Medical Record Number: [id number] Patient Account Number: [id number] Date of Birth/Sex: Treating RN: 11-12-1971 (51 y.o. [name] Primary Care Provider: [name] Other Clinician: Referring Provider: Treating Provider/Extender: Delano Metz in Treatment: 29 Information Obtained from: Patient Chief Complaint patient is here for a review of the wound on his plantar left first metatarsal head and back of his right leg Electronic Signature(s) Signed: 01/27/2022 2:30:41 PM By: Kalman Shan DO Entered By: Kalman Shan on 01/27/2022 14:14:32 -------------------------------------------------------------------------------- Debridement Details Patient Name: Date of Service: [name] [name] [name], [name] 01/27/2022 12:30 PM Medical Record Number: [id number] Patient Account Number: [id number] Date of Birth/Sex: Treating RN: Dec 22, 1970 (51 y.o. [name] Primary Care Provider: [name] Other Clinician: Referring Provider: Treating Provider/Extender: Delano Metz in Treatment: 76 Debridement Performed for Assessment: Wound #2R Left Metatarsal head first Performed By: Physician Kalman Shan, DO Debridement Type: Debridement Severity of Tissue Pre Debridement: Fat layer exposed Level of Consciousness (Pre-procedure): Awake and Alert Pre-procedure Verification/Time Out Yes - 12:59 Taken: Start Time: 13:00 Pain Control: Other : Benzocaine T Area Debrided (L x W): otal 0.9 (cm) x 0.2 (cm) = 0.18 (cm) Tissue and other material debrided: Non-Viable, Callus, Skin: Dermis Level: Skin/Dermis Debridement Description: Selective/Open Wound Instrument: Blade, Curette, Forceps Bleeding: Minimum Hemostasis Achieved: Pressure End Time: 13:08 Response to  Treatment: Procedure was tolerated well Level of Consciousness (Post- Awake and Alert procedure): Post Debridement Measurements of Total Wound Length: (cm) 0.9 Width: (cm) 0.2 Depth: (cm) 0.4 Volume: (cm) 0.057 Character of Wound/Ulcer Post Debridement: Stable Severity of Tissue Post Debridement: Fat layer exposed Post Procedure Diagnosis Same as Pre-procedure Electronic Signature(s) Signed: 01/27/2022 2:30:41 PM By: Kalman Shan DO Signed: 01/27/2022 5:17:32 PM By: Lorrin Jackson Entered By: Lorrin Jackson on 01/27/2022 13:14:19 -------------------------------------------------------------------------------- HPI Details Patient Name: Date of Service: [name] [name], [name] 01/27/2022 12:30 PM Medical Record Number: [id number] Patient Account Number: [id number] Date of Birth/Sex: Treating RN: January 20, 1971 (51 y.o. [name] Primary Care Provider: [name] Other Clinician: Referring Provider: Treating Provider/Extender: Delano Metz in Treatment: 87 History of Present Illness HPI Description: 04/21/18 ADMISSION This is a 51 year old man who is a type II diabetic. In spite of fact his hemoglobin A1c is actually quite good 5.73 months ago he is at a really difficult time over the last 6-7 months. He developed a rapidly progressive infection in the right foot in November 2018 associated with osteomyelitis and necrotizing fasciitis. 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 The stump required and a revision on 11/24/17. The stump revision was advertised as being secondary to falls. I'm not sure the progressive history here however this area is actually closed over. The patient tells me over the same timeframe he has had wounds on the plantar aspect of his left foot. In the ED saw Dr. Sharol Given in April of this year. 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 It is really not clear that this patient is been dressing  this with anything. He came in with the clinic without any specific dressing on the wound areas using his own tennis shoe. He tells me that he only ambulates of course to do a pivot transfer. He does not have his prosthesis for the right leg as of yet and he blames  Medicaid for this. He does however use the foot to push himself along in his wheelchair at home. The patient has not had formal arterial studies. ABI in our clinic on the left was 1.13. Patient's past medical history includes type 2 diabetes, fracture of the right fibula. I see that he was treated for abscesses on his right buttock and chest in 2016. I did not look at the microbiology of this. 04/28/18; patient comes back in the clinic today with the wound pretty much the same as when he came in here last week. Small opening lots of undermining relatively. He tells [place] that nothing is really been on this for 3 days in spite of the fact that we gave him enough to dress this easily especially such a small wound. He says he lives with his mother, she is not capable of assisting with this he is changing the dressings himself. 05/05/18; much better-looking wound today. Smaller. There is some undermining medially however that's only perhaps 2 mm. No surrounding erythema. We've been using silver collagen 05/12/18; small wound on the first metatarsal head. No undermining no surrounding erythema. We've been using silver collagen he has home health coming out to change 05/20/18 the wound on the first metatarsal head looks better. Covered in surface debris/callus nonviable tissue. Required debridement but post debridement this looks quite good. We've been using silver collagen. He has home health 05/27/18; first metatarsal head wound continues to improve. Just about completely closed. Still a lot of surface debridement callus. We've been using silver collagen 06/06/18; the first metatarsal wound is completely healed over. There is still a lot of callus. I  gently removed some of this just to make sure that there was no open area and there is not. The patient mentions to me that his left leg has felt like "lead" for about a week READMISSION 08/16/2020 This is a 51 year old man with type 2 diabetes. He returns to clinic today with a 1 month history of a blister and callus over the first metatarsal head. This is in exactly the same area as when he was here in 2019. 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 He has standard running shoes on the left foot to match the area in his prosthesis. He has only been covering this area with a Band-Aid has not really been specifically dressing this. ABI in our clinic on the left was 1.16. This is essentially stable from the value in 2019 10/1 the patient has a linear wound over the left first metatarsal head. Again not a lot of callus thick skin around this that I removed with a #3 curette. This is not go deep to bone or muscle it does not look to be infected. 10/8 first metatarsal head. The wound looks like it is closing however this is going to be a difficult area to offload in the future. He has a size 15 shoe and he says his shoes are years old. The inserts in these current shoes are totally useless and I have advised him to replace these. He may need a new pair of shoes and I have he got original prosthesis at [facility] I have asked him to go back there. 11/9; the patient has not been here in more than a month. Not sure he was healed when he was here the last time. I told him he would have to get new shoes and certainly offload the  area over the left first metatarsal head. He has done nothing of the kind. He arrives back in clinic with the same old new balance sneakers. A very large separating callus over the first metatarsal head on the left 11/16; he has purchased new shoes and has at least 2 insoles like I asked. The wound is measuring much smaller. He  is using silver alginate he changes the dressing himself 11/30; wound is measurably smaller in length of about a half a centimeter. This is generally very little depth. We have been using silver alginate. He changes the dressing himself every second day. 12/14; wound is about the same size. Significant undermining medially relative to the size of the wound. We have been using silver alginate. There is not a way to offload this area as he walks with a prosthesis on the right leg 12/28; wound is about the same size. Again he has undermining medially. I am using polymen on this 12/02/2020; the wound looks smaller. Still a senescent edge. We have been using polymen 1/24; the wound is come down a few millimeters. Still a senescent edge. I have been using polymen 2/8; the wound is come down slightly. Still with slight undermining from 12-6 o'clock I have been using polymen partially to get offloading on this area which is opposed by his prosthesis on the other leg. 2/22 quite open improvement he still has a comma shaped wound on the left first metatarsal head. Some depth. Using polymen 3/14; he still has the same problem of a comma shaped area over the left first metatarsal head. This seems to have had more depth today at 0.6 cm. We have been using polymen. The patient changes this himself every second day. I explored the idea of a total contact cast on the left leg however this is his driving foot. He was not enamored with the idea of not being able to drive and states that he does not have anybody else to get him around 3/28; again he comes in with a smaller looking wound however there is depth and undermining requiring debridement. We have been using Hydrofera Blue, changed to silver collagen today. The patient is doing the dressing himself 4/11; patient presents today for follow-up of his left diabetic foot wound. He denies any issues in the past 2 weeks. He is currently using silver collagen  every other day with dressing changes. He does not use diabetic shoes or special inserts to his sneakers. He does not use felt pads for offloading. 4/19; patient presents for his 1 week follow-up. He denies any issues. He reports using Hydrofera Blue and has not been using silver collagen for dressing changes. He reports minimal drainage to the wound. He overall feels well. 5/3; patient presents for 2-week follow-up. He has been using silver collagen without issues. He has no complaints today and states he overall feels well. 5/17; patient presents for 2-week follow-up. He states that the sleeve is rubbing up against the back of his right leg and causing a wound. He states this happened a week ago and has not been dressing the wound. The plantar wound is stable and he has been using collagen every other day. 5/24; Patient was had a recent hospitalization for cellulitis of his right popliteal fossa. He was admitted and started on IV antibiotics. He does not recall the plantar wound dressed while inpatient. He feels a lot better today. He was discharged on 5/21 on oral antibiotics and has not picked this up until today.  He reports using collagen to the plantar wound since discharge. He is keeping the right posterior knee wound covered. He is getting a new sleeve for his prostatic on 5/26. He currently denies signs of infection. 6/1; patient presents for 1 week follow-up. He is still on doxycycline. He has not been dressing the back of the right knee wound. He states he is receiving his new prosthetic sleeve tomorrow. He states he is using silver alginate to the plantar foot wound. He currently denies signs of infection. He states he has not been offloading the left foot wound 6/7; patient presents for 1 week follow-up. He completes his last dose of doxycycline today. He finally received his prosthetic sleeve for the right side and he states it feels much better. He has been using antibiotic ointment to  the back of that right knee wound. He has been using silver alginate to the plantar foot wound. He currently denies signs of infection. 6/21; patient presents for 2-week follow-up. He reports no issues with his prosthetic sleeve. He has been using antibiotic ointment to the back of the right knee. He has been using silver alginate to the plantar foot wound. He has no issues or complaints today. He denies signs of infection. 7/5; patient presents for 2-week follow-up. He reports no issues or complaints today. He continues to use antibiotic ointment to the right posterior knee wound and silver alginate to the left plantar foot wound. He denies signs of infection. 7/19; patient presents for 2-week follow-up. He continues to use antibiotic ointment to the right posterior knee and silver alginate to the left plantar foot wound. He has no issues or complaints today. He denies signs of infection. 8/4; patient presents for 2-week follow-up. He has no issues or complaints today. He denies signs of infection. 8/18; patient presents for 2-week follow-up. He has no issues or concerns today. He has been approved for Apligraf and he would like to have this placed today. He denies signs of infection and reports that his right posterior knee wound is closed. 8/25; patient presents for 1 week follow-up. He had no issues with his first Apligraf placement last week. He did however develop an abscess to the back of his right knee. He reports tenderness to this area. He denies systemic signs of infection. 9/1; patient presents for 1 week follow-up. He has had no issues with his plantar foot wound. The Apligraf has stayed in place over the past week. A wound culture was done at last clinic visit and a new antibiotic was sent to the pharmacy. Patient has not received this yet. He reports some mild tenderness to the back of his right knee. He denies systemic signs of infection. 9/8; patient presents for 1 week follow-up. He  states he took Keflex and had diarrhea as a side effect. He was able to complete the course. He reports continued tenderness to the right posterior knee wound. He has some drainage still. He denies increased warmth or erythema to the area. He denies fever/chills or nausea/vomiting. He has no issues or complaints today about his plantar foot wound. 9/15; patient presents for follow-up. He reports improvement in pain to the back of his right leg. He does not report any drainage. He had an ultrasound completed on 9/9 that showed a superficial fluid collection concerning for abscess. We attempted to call patient with results with no answer. We did put an urgent referral to general surgery and he reports he has not received a call. He currently  denies systemic signs of infection. He reports no issues to his left plantar foot wound. 9/27; patient presents for follow-up. He has no issues or complaints today. He has had no issues with the previous wound to the back of his right leg. He has not made an appointment to see general surgery. He had Apligraf placed at last clinic visit to the plantar foot wound. He currently denies signs of infection. 10/7; patient presents for follow-up. Upon entering the room there is vomit throughout the entire floor. Patient states he does not feel well. I recommended he go to urgent care. I recommended following up next week for wound care. 10/10; patient presents for follow-up. He came last week for his follow-up however he was ill and started vomiting in the exam room. Today he states he feels better however he continues to have vomiting episodes. He states he attempted to go to urgent care/the ED but could not find a parking spot. He subsequently went home. He has no issues or complaints today regarding the foot wound. He never followed up with general surgery for the abscess identified on ultrasound to the back of his right knee. 10/20; patient presents for follow-up. He  has no issues or complaints today. Overall he feels well today. 10/27; patient presents for follow-up. He has no issues or complaints today. 11/3; patient presents for follow-up. He has no issues or complaints today. He states he does not want to follow-up with orthopedics to reevaluate the abscess in the back of his right leg. He denies signs of infection. 11/15; patient presents for follow-up. He has no issues or complaints today. He had been using silver alginate to the left first met head wound. He denies signs of infection. 11/22; patient presents for follow-up. He had Affinity placed at last clinic visit. He has no issues or complaints today. 11/29; patient presents for follow-up. He is in good spirits today. He has no issues or complaints today. 12/6; patient presents for follow-up. He has no issues or complaints today. 12/13; patient presents for follow-up. He denies signs of infection. He has no issues or complaints today. 12/20; patient presents for follow-up. He has no issues or complaints today. He denies signs of infection. 12/29; patient presents for follow-up. He states that a few days ago he has had reaccumulation of an abscess behind the right knee. He denies increased warmth or erythema to the area. He denies fever/chills or drainage. 1/6; patient presents for follow-up. He states he went to the [facility] emergency department to be evaluated for the abscess to his right posterior knee. He was started on clindamycin. They were unable to drain the abscess. Patient states that eventually the abscess ruptured. He reports improvement in his symptoms. He has no issues or complaints about his left foot wound. 1/17; patient presents for follow-up. He has no issues or complaints today. 1/24; patient presents for follow-up. He has no issues or complaints today. He denies signs of infection. 1/31; patient presents for follow-up. He has been approved for Affinity however has a 20% co-pay.  Patient has declined further Affinity treatments due to this. He currently denies signs of infection. 2/7; patient presents for follow-up. He has no issues or complaints today. He denies signs of infection. He has been doing Hydrofera Blue dressing changes. 2/14; patient presents for follow-up. He has no issues or complaints today. He has been using blast X with collagen to the wound bed. He currently denies any issues. 2/28; patient presents for follow-up. He  denies signs of infection. He reports using blast X with collagen to the wound bed. 3/7; patient presents for follow-up. He states he has been using gentamicin ointment and urea cream with dressing changes. He reports that the back of his right knee has developed an abscess again and is draining. He states this started today. Electronic Signature(s) Signed: 01/27/2022 2:30:41 PM By: Kalman Shan DO Entered By: Kalman Shan on 01/27/2022 14:15:23 -------------------------------------------------------------------------------- Physical Exam Details Patient Name: Date of Service: [name] [name], [name] 01/27/2022 12:30 PM Medical Record Number: [id number] Patient Account Number: [id number] Date of Birth/Sex: Treating RN: [birth year] (51 y.o. [name] Primary Care Provider: [name] Other Clinician: Referring Provider: Treating Provider/Extender: Delano Metz in Treatment: 62 Constitutional respirations regular, non-labored and within target range for patient.. Cardiovascular 2+ dorsalis pedis/posterior tibialis pulses. Psychiatric pleasant and cooperative. Notes Left leg: T the first met head there is an open slit like wound with granulation tissue and circumferential callus. No signs of infection. o Right leg: T the posterior knee there is a small open area with serosanguineous drainage on palpation. T o enderness on palpation. Electronic Signature(s) Signed: 01/27/2022 2:30:41 PM By:  Kalman Shan DO Entered By: Kalman Shan on 01/27/2022 14:16:28 -------------------------------------------------------------------------------- Physician Orders Details Patient Name: Date of Service: [name] [name], [name] 01/27/2022 12:30 PM Medical Record Number: [id number] Patient Account Number: [id number] Date of Birth/Sex: Treating RN: March 07, 1971 (51 y.o. [name] Primary Care Provider: [name] Other Clinician: Referring Provider: Treating Provider/Extender: Delano Metz in Treatment: 86 Verbal / Phone Orders: No Diagnosis Coding ICD-10 Coding Code Description [phone] Non-pressure chronic ulcer of other part of left foot with fat layer exposed E11.621 Type 2 diabetes mellitus with foot ulcer E11.42 Type 2 diabetes mellitus with diabetic polyneuropathy Z89.511 Acquired absence of right leg below knee Follow-up Appointments ppointment in 1 week. - with Dr. Heber  (Room 7, [facility]) Return A Edema Control - Lymphedema / SCD / Other Elevate legs to the level of the heart or above for 30 minutes daily and/or when sitting, a frequency of: - throughout the day Avoid standing for long periods of time. Off-Loading Other: - minimize walking on left foot related to pressure to wound. Aid in wound healing. Wound Treatment Wound #2R - Metatarsal head first Wound Laterality: Left Cleanser: Soap and Water 1 x Per Day/30 Days Discharge Instructions: May shower and wash wound with dial antibacterial soap and water prior to dressing change. Cleanser: Wound Cleanser (Generic) 1 x Per Day/30 Days Discharge Instructions: Cleanse the wound with wound cleanser prior to applying a clean dressing using gauze sponges, not tissue or cotton balls. Peri-Wound Care: 40% Urea Cream 1 x Per Day/30 Days Discharge Instructions: Apply to callous around wound daily Topical: Gentamicin 1 x Per Day/30 Days Discharge Instructions: apply directly to wound. apply  Q-tip to apply directly into wound bed. Secondary Dressing: Woven Gauze Sponges 2x2 in (Generic) 1 x Per Day/30 Days Discharge Instructions: Apply over primary dressing as directed. Secondary Dressing: Optifoam Non-Adhesive Dressing, 4x4 in (Generic) 1 x Per Day/30 Days Discharge Instructions: cut to make foam donut to help offload Secured With: Kerlix Roll Sterile, 4.5x3.1 (in/yd) (Generic) 1 x Per Day/30 Days Discharge Instructions: Secure with Kerlix as directed. Secured With: 91M Medipore H Soft Cloth Surgical Tape, 2x2 (in/yd) (Generic) 1 x Per Day/30 Days Discharge Instructions: Secure dressing with tape as directed. Patient Medications llergies: adhesive tape A Notifications Medication Indication Start End 01/27/2022 clindamycin HCl  DOSE 1 - oral 300 mg capsule - 1 capsule oral 4 times daily x 7 days Electronic Signature(s) Signed: 01/27/2022 2:30:41 PM By: Kalman Shan DO Previous Signature: 01/27/2022 1:21:35 PM Version By: Kalman Shan DO Entered By: Kalman Shan on 01/27/2022 14:17:06 -------------------------------------------------------------------------------- Problem List Details Patient Name: Date of Service: [name] [name], [name] 01/27/2022 12:30 PM Medical Record Number: [id number] Patient Account Number: [id number] Date of Birth/Sex: Treating RN: 1971-05-01 (52 y.o. [name] Primary Care Provider: [name] Other Clinician: Referring Provider: Treating Provider/Extender: Delano Metz in Treatment: 51 Active Problems ICD-10 Encounter Code Description Active Date MDM Diagnosis [phone] Non-pressure chronic ulcer of other part of left foot with fat layer exposed 08/16/2020 No Yes E11.621 Type 2 diabetes mellitus with foot ulcer 08/16/2020 No Yes E11.42 Type 2 diabetes mellitus with diabetic polyneuropathy 08/16/2020 No Yes Z89.511 Acquired absence of right leg below knee 08/16/2020 No Yes L02.415 Cutaneous abscess of  right lower limb 01/27/2022 No Yes Inactive Problems Resolved Problems ICD-10 Code Description Active Date Resolved Date S81.801D Unspecified open wound, right lower leg, subsequent encounter 04/08/2021 04/08/2021 Electronic Signature(s) Signed: 01/27/2022 2:30:41 PM By: Kalman Shan DO Entered By: Kalman Shan on 01/27/2022 14:09:43 -------------------------------------------------------------------------------- Progress Note Details Patient Name: Date of Service: [name] [name], [name] 01/27/2022 12:30 PM Medical Record Number: [id number] Patient Account Number: [id number] Date of Birth/Sex: Treating RN: [birth year] (51 y.o. [name] Primary Care Provider: [name] Other Clinician: Referring Provider: Treating Provider/Extender: Delano Metz in Treatment: 65 Subjective Chief Complaint Information obtained from Patient patient is here for a review of the wound on his plantar left first metatarsal head and back of his right leg History of Present Illness (HPI) 04/21/18 ADMISSION This is a 51 year old man who is a type II diabetic. In spite of fact his hemoglobin A1c is actually quite good 5.73 months ago he is at a really difficult time over the last 6-7 months. He developed a rapidly progressive infection in the right foot in November 2018 associated with osteomyelitis and necrotizing fasciitis. 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 The stump required and a revision on 11/24/17. The stump revision was advertised as being secondary to falls. I'm not sure the progressive history here however this area is actually closed over. The patient tells me over the same timeframe he has had wounds on the plantar aspect of his left foot. In the ED saw Dr. Sharol Given in April of this year. 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 It is really not clear that this patient is been dressing this with anything. He came in  with the clinic without any specific dressing on the wound areas using his own tennis shoe. He tells me that he only ambulates of course to do a pivot transfer. He does not have his prosthesis for the right leg as of yet and he blames Medicaid for this. He does however use the foot to push himself along in his wheelchair at home. The patient has not had formal arterial studies. ABI in our clinic on the left was 1.13. Patient's past medical history includes type 2 diabetes, fracture of the right fibula. I see that he was treated for abscesses on his right buttock and chest in 2016. I did not look at the microbiology of this. 04/28/18; patient comes back in the clinic today with the wound pretty much the same as when he came in  here last week. Small opening lots of undermining relatively. He tells [place] that nothing is really been on this for 3 days in spite of the fact that we gave him enough to dress this easily especially such a small wound. He says he lives with his mother, she is not capable of assisting with this he is changing the dressings himself. 05/05/18; much better-looking wound today. Smaller. There is some undermining medially however that's only perhaps 2 mm. No surrounding erythema. We've been using silver collagen 05/12/18; small wound on the first metatarsal head. No undermining no surrounding erythema. We've been using silver collagen he has home health coming out to change 05/20/18 the wound on the first metatarsal head looks better. Covered in surface debris/callus nonviable tissue. Required debridement but post debridement this looks quite good. We've been using silver collagen. He has home health 05/27/18; first metatarsal head wound continues to improve. Just about completely closed. Still a lot of surface debridement callus. We've been using silver collagen 06/06/18; the first metatarsal wound is completely healed over. There is still a lot of callus. I gently removed some of this just  to make sure that there was no open area and there is not. The patient mentions to me that his left leg has felt like "lead" for about a week READMISSION 08/16/2020 This is a 51 year old man with type 2 diabetes. He returns to clinic today with a 1 month history of a blister and callus over the first metatarsal head. This is in exactly the same area as when he was here in 2019. 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 He has standard running shoes on the left foot to match the area in his prosthesis. He has only been covering this area with a Band-Aid has not really been specifically dressing this. ABI in our clinic on the left was 1.16. This is essentially stable from the value in 2019 10/1 the patient has a linear wound over the left first metatarsal head. Again not a lot of callus thick skin around this that I removed with a #3 curette. This is not go deep to bone or muscle it does not look to be infected. 10/8 first metatarsal head. The wound looks like it is closing however this is going to be a difficult area to offload in the future. He has a size 15 shoe and he says his shoes are years old. The inserts in these current shoes are totally useless and I have advised him to replace these. He may need a new pair of shoes and I have he got original prosthesis at [facility] I have asked him to go back there. 11/9; the patient has not been here in more than a month. Not sure he was healed when he was here the last time. I told him he would have to get new shoes and certainly offload the area over the left first metatarsal head. He has done nothing of the kind. He arrives back in clinic with the same old new balance sneakers. A very large separating callus over the first metatarsal head on the left 11/16; he has purchased new shoes and has at least 2 insoles like I asked. The wound is measuring much smaller. He is using silver alginate he  changes the dressing himself 11/30; wound is measurably smaller in length of about a half a centimeter. This is generally very little depth. We have been  using silver alginate. He changes the dressing himself every second day. 12/14; wound is about the same size. Significant undermining medially relative to the size of the wound. We have been using silver alginate. There is not a way to offload this area as he walks with a prosthesis on the right leg 12/28; wound is about the same size. Again he has undermining medially. I am using polymen on this 12/02/2020; the wound looks smaller. Still a senescent edge. We have been using polymen 1/24; the wound is come down a few millimeters. Still a senescent edge. I have been using polymen 2/8; the wound is come down slightly. Still with slight undermining from 12-6 o'clock I have been using polymen partially to get offloading on this area which is opposed by his prosthesis on the other leg. 2/22 quite open improvement he still has a comma shaped wound on the left first metatarsal head. Some depth. Using polymen 3/14; he still has the same problem of a comma shaped area over the left first metatarsal head. This seems to have had more depth today at 0.6 cm. We have been using polymen. The patient changes this himself every second day. I explored the idea of a total contact cast on the left leg however this is his driving foot. He was not enamored with the idea of not being able to drive and states that he does not have anybody else to get him around 3/28; again he comes in with a smaller looking wound however there is depth and undermining requiring debridement. We have been using Hydrofera Blue, changed to silver collagen today. The patient is doing the dressing himself 4/11; patient presents today for follow-up of his left diabetic foot wound. He denies any issues in the past 2 weeks. He is currently using silver collagen every other day with dressing  changes. He does not use diabetic shoes or special inserts to his sneakers. He does not use felt pads for offloading. 4/19; patient presents for his 1 week follow-up. He denies any issues. He reports using Hydrofera Blue and has not been using silver collagen for dressing changes. He reports minimal drainage to the wound. He overall feels well. 5/3; patient presents for 2-week follow-up. He has been using silver collagen without issues. He has no complaints today and states he overall feels well. 5/17; patient presents for 2-week follow-up. He states that the sleeve is rubbing up against the back of his right leg and causing a wound. He states this happened a week ago and has not been dressing the wound. The plantar wound is stable and he has been using collagen every other day. 5/24; Patient was had a recent hospitalization for cellulitis of his right popliteal fossa. He was admitted and started on IV antibiotics. He does not recall the plantar wound dressed while inpatient. He feels a lot better today. He was discharged on 5/21 on oral antibiotics and has not picked this up until today. He reports using collagen to the plantar wound since discharge. He is keeping the right posterior knee wound covered. He is getting a new sleeve for his prostatic on 5/26. He currently denies signs of infection. 6/1; patient presents for 1 week follow-up. He is still on doxycycline. He has not been dressing the back of the right knee wound. He states he is receiving his new prosthetic sleeve tomorrow. He states he is using silver alginate to the plantar foot wound. He currently denies signs of infection. He states he has not  been offloading the left foot wound 6/7; patient presents for 1 week follow-up. He completes his last dose of doxycycline today. He finally received his prosthetic sleeve for the right side and he states it feels much better. He has been using antibiotic ointment to the back of that right knee  wound. He has been using silver alginate to the plantar foot wound. He currently denies signs of infection. 6/21; patient presents for 2-week follow-up. He reports no issues with his prosthetic sleeve. He has been using antibiotic ointment to the back of the right knee. He has been using silver alginate to the plantar foot wound. He has no issues or complaints today. He denies signs of infection. 7/5; patient presents for 2-week follow-up. He reports no issues or complaints today. He continues to use antibiotic ointment to the right posterior knee wound and silver alginate to the left plantar foot wound. He denies signs of infection. 7/19; patient presents for 2-week follow-up. He continues to use antibiotic ointment to the right posterior knee and silver alginate to the left plantar foot wound. He has no issues or complaints today. He denies signs of infection. 8/4; patient presents for 2-week follow-up. He has no issues or complaints today. He denies signs of infection. 8/18; patient presents for 2-week follow-up. He has no issues or concerns today. He has been approved for Apligraf and he would like to have this placed today. He denies signs of infection and reports that his right posterior knee wound is closed. 8/25; patient presents for 1 week follow-up. He had no issues with his first Apligraf placement last week. He did however develop an abscess to the back of his right knee. He reports tenderness to this area. He denies systemic signs of infection. 9/1; patient presents for 1 week follow-up. He has had no issues with his plantar foot wound. The Apligraf has stayed in place over the past week. A wound culture was done at last clinic visit and a new antibiotic was sent to the pharmacy. Patient has not received this yet. He reports some mild tenderness to the back of his right knee. He denies systemic signs of infection. 9/8; patient presents for 1 week follow-up. He states he took Keflex and  had diarrhea as a side effect. He was able to complete the course. He reports continued tenderness to the right posterior knee wound. He has some drainage still. He denies increased warmth or erythema to the area. He denies fever/chills or nausea/vomiting. He has no issues or complaints today about his plantar foot wound. 9/15; patient presents for follow-up. He reports improvement in pain to the back of his right leg. He does not report any drainage. He had an ultrasound completed on 9/9 that showed a superficial fluid collection concerning for abscess. We attempted to call patient with results with no answer. We did put an urgent referral to general surgery and he reports he has not received a call. He currently denies systemic signs of infection. He reports no issues to his left plantar foot wound. 9/27; patient presents for follow-up. He has no issues or complaints today. He has had no issues with the previous wound to the back of his right leg. He has not made an appointment to see general surgery. He had Apligraf placed at last clinic visit to the plantar foot wound. He currently denies signs of infection. 10/7; patient presents for follow-up. Upon entering the room there is vomit throughout the entire floor. Patient states he does not  feel well. I recommended he go to urgent care. I recommended following up next week for wound care. 10/10; patient presents for follow-up. He came last week for his follow-up however he was ill and started vomiting in the exam room. Today he states he feels better however he continues to have vomiting episodes. He states he attempted to go to urgent care/the ED but could not find a parking spot. He subsequently went home. He has no issues or complaints today regarding the foot wound. He never followed up with general surgery for the abscess identified on ultrasound to the back of his right knee. 10/20; patient presents for follow-up. He has no issues or complaints  today. Overall he feels well today. 10/27; patient presents for follow-up. He has no issues or complaints today. 11/3; patient presents for follow-up. He has no issues or complaints today. He states he does not want to follow-up with orthopedics to reevaluate the abscess in the back of his right leg. He denies signs of infection. 11/15; patient presents for follow-up. He has no issues or complaints today. He had been using silver alginate to the left first met head wound. He denies signs of infection. 11/22; patient presents for follow-up. He had Affinity placed at last clinic visit. He has no issues or complaints today. 11/29; patient presents for follow-up. He is in good spirits today. He has no issues or complaints today. 12/6; patient presents for follow-up. He has no issues or complaints today. 12/13; patient presents for follow-up. He denies signs of infection. He has no issues or complaints today. 12/20; patient presents for follow-up. He has no issues or complaints today. He denies signs of infection. 12/29; patient presents for follow-up. He states that a few days ago he has had reaccumulation of an abscess behind the right knee. He denies increased warmth or erythema to the area. He denies fever/chills or drainage. 1/6; patient presents for follow-up. He states he went to the [facility] emergency department to be evaluated for the abscess to his right posterior knee. He was started on clindamycin. They were unable to drain the abscess. Patient states that eventually the abscess ruptured. He reports improvement in his symptoms. He has no issues or complaints about his left foot wound. 1/17; patient presents for follow-up. He has no issues or complaints today. 1/24; patient presents for follow-up. He has no issues or complaints today. He denies signs of infection. 1/31; patient presents for follow-up. He has been approved for Affinity however has a 20% co-pay. Patient has declined further  Affinity treatments due to this. He currently denies signs of infection. 2/7; patient presents for follow-up. He has no issues or complaints today. He denies signs of infection. He has been doing Hydrofera Blue dressing changes. 2/14; patient presents for follow-up. He has no issues or complaints today. He has been using blast X with collagen to the wound bed. He currently denies any issues. 2/28; patient presents for follow-up. He denies signs of infection. He reports using blast X with collagen to the wound bed. 3/7; patient presents for follow-up. He states he has been using gentamicin ointment and urea cream with dressing changes. He reports that the back of his right knee has developed an abscess again and is draining. He states this started today. Patient History Information obtained from Patient. Family History Diabetes - Maternal Grandparents,Mother, Heart Disease - Father, Hypertension - Father, No family history of Cancer, Hereditary Spherocytosis, Kidney Disease, Lung Disease, Seizures, Stroke, Thyroid Problems, Tuberculosis. Social History  Current every day smoker, Marital Status - Single, Alcohol Use - Never, Drug Use - No History, Caffeine Use - Never. Medical History Eyes Denies history of Cataracts, Glaucoma, Optic Neuritis Ear/Nose/Mouth/Throat Denies history of Chronic sinus problems/congestion, Middle ear problems Hematologic/Lymphatic Patient has history of Anemia Denies history of Hemophilia, Human Immunodeficiency Virus, Lymphedema, Sickle Cell Disease Respiratory Denies history of Aspiration, Asthma, Chronic Obstructive Pulmonary Disease (COPD), Pneumothorax, Sleep Apnea, Tuberculosis Cardiovascular Patient has history of Hypertension, Peripheral Venous Disease Denies history of Angina, Arrhythmia, Congestive Heart Failure, Coronary Artery Disease, Deep Vein Thrombosis, Hypotension, Myocardial Infarction, Peripheral Arterial Disease, Phlebitis,  Vasculitis Gastrointestinal Denies history of Cirrhosis , Colitis, Crohnoos, Hepatitis A, Hepatitis B, Hepatitis C Endocrine Patient has history of Type II Diabetes - oral meds Denies history of Type I Diabetes Genitourinary Denies history of End Stage Renal Disease Immunological Denies history of Lupus Erythematosus, Raynaudoos, Scleroderma Integumentary (Skin) Denies history of History of Burn Musculoskeletal Denies history of Gout, Rheumatoid Arthritis, Osteoarthritis, Osteomyelitis Neurologic Denies history of Dementia, Neuropathy, Quadriplegia, Paraplegia, Seizure Disorder Oncologic Denies history of Received Chemotherapy, Received Radiation Psychiatric Denies history of Anorexia/bulimia, Confinement Anxiety Hospitalization/Surgery History - fever 105, sepsis cellulitis right popliteal fossa 04/09/2021. Medical A Surgical History Notes [name] Constitutional Symptoms (General Health) falls , leukocytosis Respiratory During waking hours and catches himself not breathing, "gasp for air". Saw Dr Wynonia Lawman, he couldn't find a reason Objective Constitutional respirations regular, non-labored and within target range for patient.. Vitals Time Taken: 12:42 PM, Height: 77 in, Weight: 232 lbs, BMI: 27.5, Temperature: 97.9 F, Pulse: 80 bpm, Respiratory Rate: 18 breaths/min, Blood Pressure: 119/69 mmHg, Capillary Blood Glucose: 93 mg/dl. Cardiovascular 2+ dorsalis pedis/posterior tibialis pulses. Psychiatric pleasant and cooperative. General Notes: Left leg: T the first met head there is an open slit like wound with granulation tissue and circumferential callus. No signs of infection. Right leg: o T the posterior knee there is a small open area with serosanguineous drainage on palpation. T o enderness on palpation. Integumentary (Hair, Skin) Wound #2R status is Open. Original cause of wound was Blister. The date acquired was: 06/23/2020. The wound has been in treatment 75 weeks. The wound  is located on the Left Metatarsal head first. The wound measures 0.9cm length x 0.2cm width x 0.4cm depth; 0.141cm^2 area and 0.057cm^3 volume. There is Fat Layer (Subcutaneous Tissue) exposed. There is no tunneling or undermining noted. There is a medium amount of serosanguineous drainage noted. The wound margin is distinct with the outline attached to the wound base. There is large (67-100%) red, pink granulation within the wound bed. There is no necrotic tissue within the wound bed. 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 I debrided nonviable tissue. No surrounding signs of infection. I recommended continuing with gentamicin, urea cream and aggressive offloading. The patient has again developed an abscess to the posterior aspect of the right leg. It is draining currently and I recommended clindamycin. He is an uncontrolled diabetic. It was recommended he go to the ED if things were to worsen. Follow-up in 1 week. Procedures Wound #2R Pre-procedure diagnosis of Wound #2R is a Diabetic Wound/Ulcer of the Lower Extremity located on the Left Metatarsal head first .Severity of Tissue Pre  Debridement is: Fat layer exposed. There was a Selective/Open Wound Skin/Dermis Debridement with a total area of 0.18 sq cm performed by Kalman Shan, DO. With the following instrument(s): Blade, Curette, and Forceps to remove Non-Viable tissue/material. Material removed includes Callus and Skin: Dermis and after achieving pain control using Other (Benzocaine). No specimens were taken. A time out was conducted at 12:59, prior to the start of the procedure. A Minimum amount  of bleeding was controlled with Pressure. The procedure was tolerated well. Post Debridement Measurements: 0.9cm length x 0.2cm width x 0.4cm depth; 0.057cm^3 volume. Character of Wound/Ulcer Post Debridement is stable. Severity of Tissue Post Debridement is: Fat layer exposed. Post procedure Diagnosis Wound #2R: Same as Pre-Procedure Plan Follow-up Appointments: Return Appointment in 1 week. - with Dr. Heber Albion (Room 7, [facility]) Edema Control - Lymphedema / SCD / Other: Elevate legs to the level of the heart or above for 30 minutes daily and/or when sitting, a frequency of: - throughout the day Avoid standing for long periods of time. Off-Loading: Other: - minimize walking on left foot related to pressure to wound. Aid in wound healing. The following medication(s) was prescribed: clindamycin HCl oral 300 mg capsule 1 1 capsule oral 4 times daily x 7 days starting 01/27/2022 WOUND #2R: - Metatarsal head first Wound Laterality: Left Cleanser: Soap and Water 1 x Per Day/30 Days Discharge Instructions: May shower and wash wound with dial antibacterial soap and water prior to dressing change. Cleanser: Wound Cleanser (Generic) 1 x Per Day/30 Days Discharge Instructions: Cleanse the wound with wound cleanser prior to applying a clean dressing using gauze sponges, not tissue or cotton balls. Peri-Wound Care: 40% Urea Cream 1 x Per Day/30 Days Discharge Instructions: Apply to callous around wound daily Topical: Gentamicin 1 x Per Day/30 Days Discharge Instructions: apply directly to wound. apply Q-tip to apply directly into wound bed. Secondary Dressing: Woven Gauze Sponges 2x2 in (Generic) 1 x Per Day/30 Days Discharge Instructions: Apply over primary dressing as directed. Secondary Dressing: Optifoam Non-Adhesive Dressing, 4x4 in (Generic) 1 x Per Day/30 Days Discharge Instructions: cut to make foam donut to help offload Secured With: Kerlix Roll Sterile, 4.5x3.1 (in/yd) (Generic) 1 x Per Day/30  Days Discharge Instructions: Secure with Kerlix as directed. Secured With: 66M Medipore H Soft Cloth Surgical T ape, 2x2 (in/yd) (Generic) 1 x Per Day/30 Days Discharge Instructions: Secure dressing with tape as directed. 1. Clindamycin 2. Gentamicin ointment and urea cream to the foot wound 3. Follow-up in 1 week Electronic Signature(s) Signed: 01/27/2022 2:30:41 PM By: Kalman Shan DO Entered By: Kalman Shan on 01/27/2022 14:29:52 -------------------------------------------------------------------------------- HxROS Details Patient Name: Date of Service: [name] [name], [name] 01/27/2022 12:30 PM Medical Record Number: [id number] Patient Account Number: [id number] Date of Birth/Sex: Treating RN: 1971-03-19 (51 y.o. [name] Primary Care Provider: Other Clinician: [name] Referring Provider: Treating Provider/Extender: Delano Metz in Treatment: 26 Information Obtained From Patient Constitutional Symptoms (General Health) Medical History: Past Medical History Notes: falls , leukocytosis Eyes Medical History: Negative for: Cataracts; Glaucoma; Optic Neuritis Ear/Nose/Mouth/Throat Medical History: Negative for: Chronic sinus problems/congestion; Middle ear problems Hematologic/Lymphatic Medical History: Positive for: Anemia Negative for: Hemophilia; Human Immunodeficiency Virus; Lymphedema; Sickle Cell Disease Respiratory Medical History: Negative for: Aspiration; Asthma; Chronic Obstructive Pulmonary Disease (COPD); Pneumothorax; Sleep Apnea; Tuberculosis Past Medical History Notes: During waking hours and catches himself not breathing, "gasp for air". Saw Dr Wynonia Lawman, he couldn't find a reason Cardiovascular Medical History: Positive for: Hypertension; Peripheral Venous  Disease Negative for: Angina; Arrhythmia; Congestive Heart Failure; Coronary Artery Disease; Deep Vein Thrombosis; Hypotension; Myocardial Infarction;  Peripheral Arterial Disease; Phlebitis; Vasculitis Gastrointestinal Medical History: Negative for: Cirrhosis ; Colitis; Crohns; Hepatitis A; Hepatitis B; Hepatitis C Endocrine Medical History: Positive for: Type II Diabetes - oral meds Negative for: Type I Diabetes Time with diabetes: 2014 Treated with: Oral agents Blood sugar tested every day: Yes Tested : Blood sugar testing results: Breakfast: 91 Genitourinary Medical History: Negative for: End Stage Renal Disease Immunological Medical History: Negative for: Lupus Erythematosus; Raynauds; Scleroderma Integumentary (Skin) Medical History: Negative for: History of Burn Musculoskeletal Medical History: Negative for: Gout; Rheumatoid Arthritis; Osteoarthritis; Osteomyelitis Neurologic Medical History: Negative for: Dementia; Neuropathy; Quadriplegia; Paraplegia; Seizure Disorder Oncologic Medical History: Negative for: Received Chemotherapy; Received Radiation Psychiatric Medical History: Negative for: Anorexia/bulimia; Confinement Anxiety Immunizations Pneumococcal Vaccine: Received Pneumococcal Vaccination: No Implantable Devices No devices added Hospitalization / Surgery History Type of Hospitalization/Surgery fever 105, sepsis cellulitis right popliteal fossa 04/09/2021 Family and Social History Cancer: No; Diabetes: Yes - Maternal Grandparents,Mother; Heart Disease: Yes - Father; Hereditary Spherocytosis: No; Hypertension: Yes - Father; Kidney Disease: No; Lung Disease: No; Seizures: No; Stroke: No; Thyroid Problems: No; Tuberculosis: No; Current every day smoker; Marital Status - Single; Alcohol Use: Never; Drug Use: No History; Caffeine Use: Never; Financial Concerns: No; Food, Clothing or Shelter Needs: No; Support System Lacking: No; Transportation Concerns: No Electronic Signature(s) Signed: 01/27/2022 2:30:41 PM By: Kalman Shan DO Signed: 01/27/2022 5:17:32 PM By: Lorrin Jackson Entered By: Kalman Shan on 01/27/2022 14:15:32 -------------------------------------------------------------------------------- SuperBill Details Patient Name: Date of Service: [name] [name], [name] 01/27/2022 Medical Record Number: [id number] Patient Account Number: [id number] Date of Birth/Sex: Treating RN: Jul 16, 1971 (51 y.o. 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

## 2022-01-27 NOTE — Progress Notes (Signed)
TAQUAN, BRALLEY ([id number]) Visit Report for 01/27/2022 Arrival Information Details Patient Name: Date of Service: [name] 01/27/2022 12:30 PM Medical Record Number: [id number] Patient Account Number: [id number] Date of Birth/Sex: Treating RN: 07-22-1971 (51 y.o. [name] Primary Care Taji Sather: [name] Other Clinician: Referring Chyanna Flock: Treating Deshawn Witty/Extender: Delano Metz in Treatment: 33 Visit Information History Since Last Visit Added or deleted any medications: Yes Patient Arrived: Ambulatory Any new allergies or adverse reactions: No Arrival Time: 12:41 Had a fall or experienced change in No Transfer Assistance: None activities of daily living that may affect Patient Identification Verified: Yes risk of falls: Secondary Verification Process Completed: Yes Signs or symptoms of abuse/neglect since last visito No Patient Requires Transmission-Based Precautions: No Hospitalized since last visit: No Patient Has Alerts: No Implantable device outside of the clinic excluding No cellular tissue based products placed in the center since last visit: Has Dressing in Place as Prescribed: Yes Pain Present Now: No Electronic Signature(s) Signed: 01/27/2022 5:17:32 PM By: Lorrin Jackson Entered By: Lorrin Jackson on 01/27/2022 12:42:14 -------------------------------------------------------------------------------- Encounter Discharge Information Details Patient Name: Date of Service: [name], [name] 01/27/2022 12:30 PM Medical Record Number: [id number] Patient Account Number: [id number] Date of Birth/Sex: Treating RN: [birth year] (51 y.o. [name] Primary Care [employer] Seals: [name] Other Clinician: Referring Jniya Madara: Treating Tylan Kinn/Extender: Delano Metz in Treatment: 27 Encounter Discharge Information Items Post Procedure Vitals Discharge Condition: Stable Temperature  (F): 97.9 Ambulatory Status: Ambulatory Pulse (bpm): 80 Discharge Destination: Home Respiratory Rate (breaths/min): 18 Transportation: Private Auto Blood Pressure (mmHg): 119/69 Schedule Follow-up Appointment: Yes Clinical Summary of Care: Provided on 01/27/2022 Form Type Recipient Paper Patient Patient Electronic Signature(s) Signed: 01/27/2022 5:17:32 PM By: Lorrin Jackson Entered By: Lorrin Jackson on 01/27/2022 13:24:16 -------------------------------------------------------------------------------- Lower Extremity Assessment Details Patient Name: Date of Service: [name], [name] 01/27/2022 12:30 PM Medical Record Number: [id number] Patient Account Number: [id number] Date of Birth/Sex: Treating RN: [birth year] (51 y.o. [name] Primary Care Laquinta Hazell: [name] Other Clinician: Referring Lennox Dolberry: Treating Nagi Furio/Extender: Delano Metz in Treatment: 75 Edema Assessment Assessed: [name]: Yes] [name]: No] Edema: [Left: Ye] [Right: s] Calf Left: Right: Point of Measurement: 50 cm From Medial Instep 45 cm Ankle Left: Right: Point of Measurement: 11 cm From Medial Instep 25.6 cm Vascular Assessment Pulses: Dorsalis Pedis Palpable: [Left:Yes] Electronic Signature(s) Signed: 01/27/2022 5:17:32 PM By: Lorrin Jackson Entered By: Lorrin Jackson on 01/27/2022 12:48:42 -------------------------------------------------------------------------------- Multi Wound Chart Details Patient Name: Date of Service: [name], [name] 01/27/2022 12:30 PM Medical Record Number: [id number] Patient Account Number: [id number] Date of Birth/Sex: Treating RN: 05-01-1971 (51 y.o. [name] Primary Care Ree Alcalde: [name] Other Clinician: Referring Jettie Lazare: Treating Nashika Coker/Extender: Delano Metz in Treatment: 17 Vital Signs Height(in): 77 Capillary Blood Glucose(mg/dl): 93 Weight(lbs): 232 Pulse(bpm):  80 Body Mass Index(BMI): 27.5 Blood Pressure(mmHg): 119/69 Temperature(F): 97.9 Respiratory Rate(breaths/min): 18 Photos: [2R:Left Metatarsal head first] [N/A:N/A N/A] Wound Location: [2R:Blister] [N/A:N/A] Wounding Event: [2R:Diabetic Wound/Ulcer of the Lower] [N/A:N/A] Primary Etiology: [2R:Extremity Anemia, Hypertension, Peripheral] [N/A:N/A] Comorbid History: [2R:Venous Disease, Type II Diabetes 06/23/2020] [N/A:N/A] Date Acquired: [2R:75] [N/A:N/A] Weeks of Treatment: [2R:Open] [N/A:N/A] Wound Status: [2R:Yes] [N/A:N/A] Wound Recurrence: [2R:0.9x0.2x0.4] [N/A:N/A] Measurements L x W x D (cm) [2R:0.141] [N/A:N/A] A (cm) : rea [2R:0.057] [N/A:N/A] Volume (cm) : [2R:-354.80%] [N/A:N/A] % Reduction in A [2R:rea: -1800.00%] [N/A:N/A] % Reduction in Volume: [2R:Grade 2] [N/A:N/A] Classification: [2R:Medium] [N/A:N/A] Exudate A mount: [2R:Serosanguineous] [  N/A:N/A] Exudate Type: [2R:red, brown] [N/A:N/A] Exudate Color: [2R:Distinct, outline attached] [N/A:N/A] Wound Margin: [2R:Large (67-100%)] [N/A:N/A] Granulation A mount: [2R:Red, Pink] [N/A:N/A] Granulation Quality: [2R:None Present (0%)] [N/A:N/A] Necrotic A mount: [2R:Fat Layer (Subcutaneous Tissue): Yes N/A] Exposed Structures: [2R:Fascia: No Tendon: No Muscle: No Joint: No Bone: No None] [N/A:N/A] Epithelialization: [2R:Debridement - Selective/Open Wound N/A] Debridement: Pre-procedure Verification/Time Out 12:59 [N/A:N/A] Taken: [2R:Other] [N/A:N/A] Pain Control: [2R:Callus] [N/A:N/A] Tissue Debrided: [2R:Skin/Dermis] [N/A:N/A] Level: [2R:0.18] [N/A:N/A] Debridement A (sq cm): [2R:rea Blade, Curette, Forceps] [N/A:N/A] Instrument: [2R:Minimum] [N/A:N/A] Bleeding: [2R:Pressure] [N/A:N/A] Hemostasis A chieved: [2R:Procedure was tolerated well] [N/A:N/A] Debridement Treatment Response: [2R:0.9x0.2x0.4] [N/A:N/A] Post Debridement Measurements L x W x D (cm) [2R:0.057] [N/A:N/A] Post Debridement Volume: (cm)  [2R:Debridement] [N/A:N/A] Treatment Notes Wound #2R (Metatarsal head first) Wound Laterality: Left Cleanser Soap and Water Discharge Instruction: May shower and wash wound with dial antibacterial soap and water prior to dressing change. Wound Cleanser Discharge Instruction: Cleanse the wound with wound cleanser prior to applying a clean dressing using gauze sponges, not tissue or cotton balls. Peri-Wound Care 40% Urea Cream Discharge Instruction: Apply to callous around wound daily Topical Gentamicin Discharge Instruction: apply directly to wound. apply Q-tip to apply directly into wound bed. Primary Dressing Secondary Dressing Woven Gauze Sponges 2x2 in Discharge Instruction: Apply over primary dressing as directed. Optifoam Non-Adhesive Dressing, 4x4 in Discharge Instruction: cut to make foam donut to help offload Secured With [employer] Sterile, 4.5x3.1 (in/yd) Discharge Instruction: Secure with Kerlix as directed. 60M Medipore H Soft Cloth Surgical T ape, 2x2 (in/yd) Discharge Instruction: Secure dressing with tape as directed. Compression Wrap Compression Stockings Add-Ons Electronic Signature(s) Signed: 01/27/2022 2:30:41 PM By: Kalman Shan DO Signed: 01/27/2022 5:17:32 PM By: Fara Chute By: Kalman Shan on 01/27/2022 14:09:53 -------------------------------------------------------------------------------- Multi-Disciplinary Care Plan Details Patient Name: Date of Service: [name], [name] 01/27/2022 12:30 PM Medical Record Number: [id number] Patient Account Number: [id number] Date of Birth/Sex: Treating RN: 04-24-1971 (51 y.o. [name] Primary Care Venda Dice: [name] Other Clinician: Referring Haille Pardi: Treating Tanith Dagostino/Extender: Delano Metz in Treatment: 14 Multidisciplinary Care Plan reviewed with physician Active Inactive Abuse / Safety / Falls / Self Care Management Nursing Diagnoses: History  of Falls Goals: Patient/caregiver will verbalize/demonstrate measures taken to prevent injury and/or falls Date Initiated: 11/04/2021 Target Resolution Date: 02/10/2022 Goal Status: Active Interventions: Assess fall risk on admission and as needed Assess impairment of mobility on admission and as needed per policy Notes: Wound/Skin Impairment Nursing Diagnoses: Impaired tissue integrity Goals: Patient/caregiver will verbalize understanding of skin care regimen Date Initiated: 11/19/2020 Target Resolution Date: 02/10/2022 Goal Status: Active Ulcer/skin breakdown will have a volume reduction of 30% by week 4 Date Initiated: 08/16/2020 Date Inactivated: 03/03/2021 Target Resolution Date: 03/10/2021 Goal Status: Met Interventions: Provide education on ulcer and skin care Notes: 07/24/21: Wound care regimen ongoing. Electronic Signature(s) Signed: 01/27/2022 5:17:32 PM By: Lorrin Jackson Entered By: Lorrin Jackson on 01/27/2022 12:36:31 -------------------------------------------------------------------------------- Pain Assessment Details Patient Name: Date of Service: [name], [name] 01/27/2022 12:30 PM Medical Record Number: [id number] Patient Account Number: [id number] Date of Birth/Sex: Treating RN: 13-Jun-1971 (51 y.o. [name] Primary Care Tiana Sivertson: [name] Other Clinician: Referring Aaliyah Cancro: Treating Reyonna Haack/Extender: Delano Metz in Treatment: 36 Active Problems Location of Pain Severity and Description of Pain Patient Has Paino No Site Locations Pain Management and Medication Current Pain Management: Electronic Signature(s) Signed: 01/27/2022 5:17:32 PM By: Lorrin Jackson Entered By: Lorrin Jackson on 01/27/2022 12:48:15 -------------------------------------------------------------------------------- Patient/Caregiver Education  Details Patient Name: Date of Service: [name] 3/7/2023andnbsp12:30  PM Medical Record Number: [id number] Patient Account Number: [id number] Date of Birth/Gender: Treating RN: 26-Sep-1971 (51 y.o. [name] Primary Care Physician: [name] Other Clinician: Referring Physician: Treating Physician/Extender: Delano Metz in Treatment: 18 Education Assessment Education Provided To: Patient Education Topics Provided Wound/Skin Impairment: Methods: Explain/Verbal, Printed Responses: State content correctly [employer]) Signed: 01/27/2022 5:17:32 PM By: Lorrin Jackson Entered By: Lorrin Jackson on 01/27/2022 12:36:55 -------------------------------------------------------------------------------- Wound Assessment Details Patient Name: Date of Service: [name], [name] 01/27/2022 12:30 PM Medical Record Number: [id number] Patient Account Number: [id number] Date of Birth/Sex: Treating RN: 1971/05/24 (51 y.o. [name] Primary Care Muaaz Brau: [name] Other Clinician: Referring Lylia Karn: Treating Nelton Amsden/Extender: Delano Metz in Treatment: 23 Wound Status Wound Number: 2R Primary Diabetic Wound/Ulcer of the Lower Extremity Etiology: Wound Location: Left Metatarsal head first Wound Status: Open Wounding Event: Blister Comorbid Anemia, Hypertension, Peripheral Venous Disease, Type II Date Acquired: 06/23/2020 History: Diabetes Weeks Of Treatment: 75 Clustered Wound: No Photos Wound Measurements Length: (cm) 0.9 Width: (cm) 0.2 Depth: (cm) 0.4 Area: (cm) 0.141 Volume: (cm) 0.057 % Reduction in Area: -354.8% % Reduction in Volume: -1800% Epithelialization: None Tunneling: No Undermining: No Wound Description Classification: Grade 2 Wound Margin: Distinct, outline attached Exudate Amount: Medium Exudate Type: Serosanguineous Exudate Color: red, brown Foul Odor After Cleansing: No Slough/Fibrino No Wound Bed Granulation Amount: Large (67-100%)  Exposed Structure Granulation Quality: Red, Pink Fascia Exposed: No Necrotic Amount: None Present (0%) Fat Layer (Subcutaneous Tissue) Exposed: Yes Tendon Exposed: No Muscle Exposed: No Joint Exposed: No Bone Exposed: No Treatment Notes Wound #2R (Metatarsal head first) Wound Laterality: Left Cleanser Soap and Water Discharge Instruction: May shower and wash wound with dial antibacterial soap and water prior to dressing change. Wound Cleanser Discharge Instruction: Cleanse the wound with wound cleanser prior to applying a clean dressing using gauze sponges, not tissue or cotton balls. Peri-Wound Care 40% Urea Cream Discharge Instruction: Apply to callous around wound daily Topical Gentamicin Discharge Instruction: apply directly to wound. apply Q-tip to apply directly into wound bed. Primary Dressing Secondary Dressing Woven Gauze Sponges 2x2 in Discharge Instruction: Apply over primary dressing as directed. Optifoam Non-Adhesive Dressing, 4x4 in Discharge Instruction: cut to make foam donut to help offload Secured With [employer] Sterile, 4.5x3.1 (in/yd) Discharge Instruction: Secure with Kerlix as directed. 57M Medipore H Soft Cloth Surgical T ape, 2x2 (in/yd) Discharge Instruction: Secure dressing with tape as directed. Compression Wrap Compression Stockings Add-Ons Electronic Signature(s) Signed: 01/27/2022 5:17:32 PM By: Lorrin Jackson Entered By: Lorrin Jackson on 01/27/2022 12:47:27 -------------------------------------------------------------------------------- Vitals Details Patient Name: Date of Service: [name], [name] 01/27/2022 12:30 PM Medical Record Number: [id number] Patient Account Number: [id number] Date of Birth/Sex: Treating RN: [birth year] (51 y.o. [name] Primary Care Akiah Bauch: [name] Other Clinician: Referring Mosi Hannold: Treating Verla Bryngelson/Extender: Delano Metz in Treatment: 67 Vital Signs Time  Taken: 12:42 Temperature (F): 97.9 Height (in): 77 Pulse (bpm): 80 Weight (lbs): 232 Respiratory Rate (breaths/min): 18 Body Mass Index (BMI): 27.5 Blood Pressure (mmHg): 119/69 Capillary Blood Glucose (mg/dl): 93 Reference Range: 80 - 120 mg / dl Electronic Signature(s) Signed: 01/27/2022 5:17:32 PM By: Lorrin Jackson Entered By: Lorrin Jackson on 01/27/2022 12:42:58

## 2022-01-31 IMAGING — CR DG KNEE COMPLETE 4+V*L*
4 series · 4 of 4 positions shown · non-contrast
Comparison: None.

CLINICAL DATA: Left knee pain after fall 2 days ago

EXAM:
LEFT KNEE - COMPLETE 4+ VIEW

[knee lat]
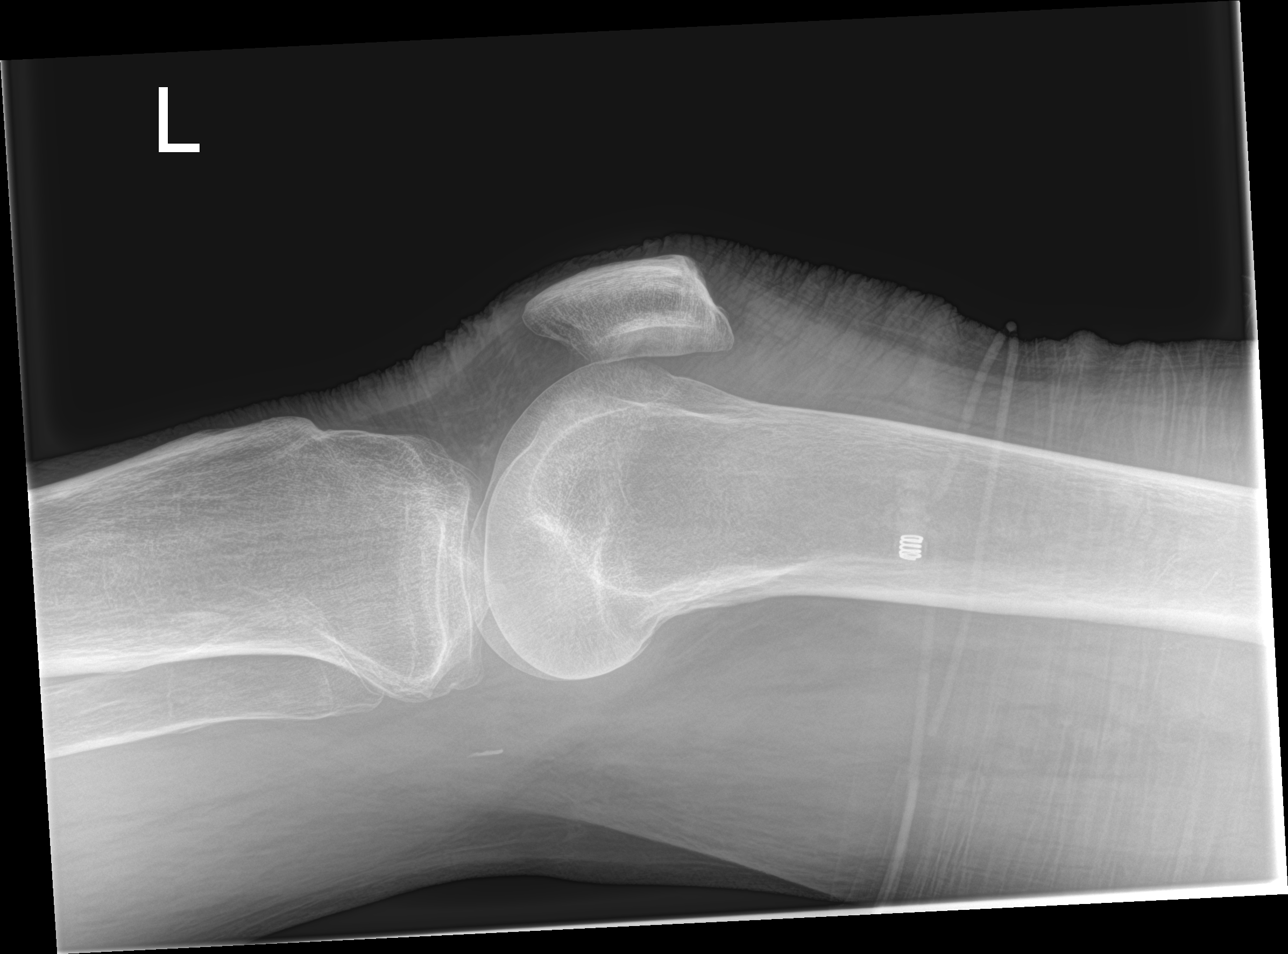

[knee obl (1 of 2)]
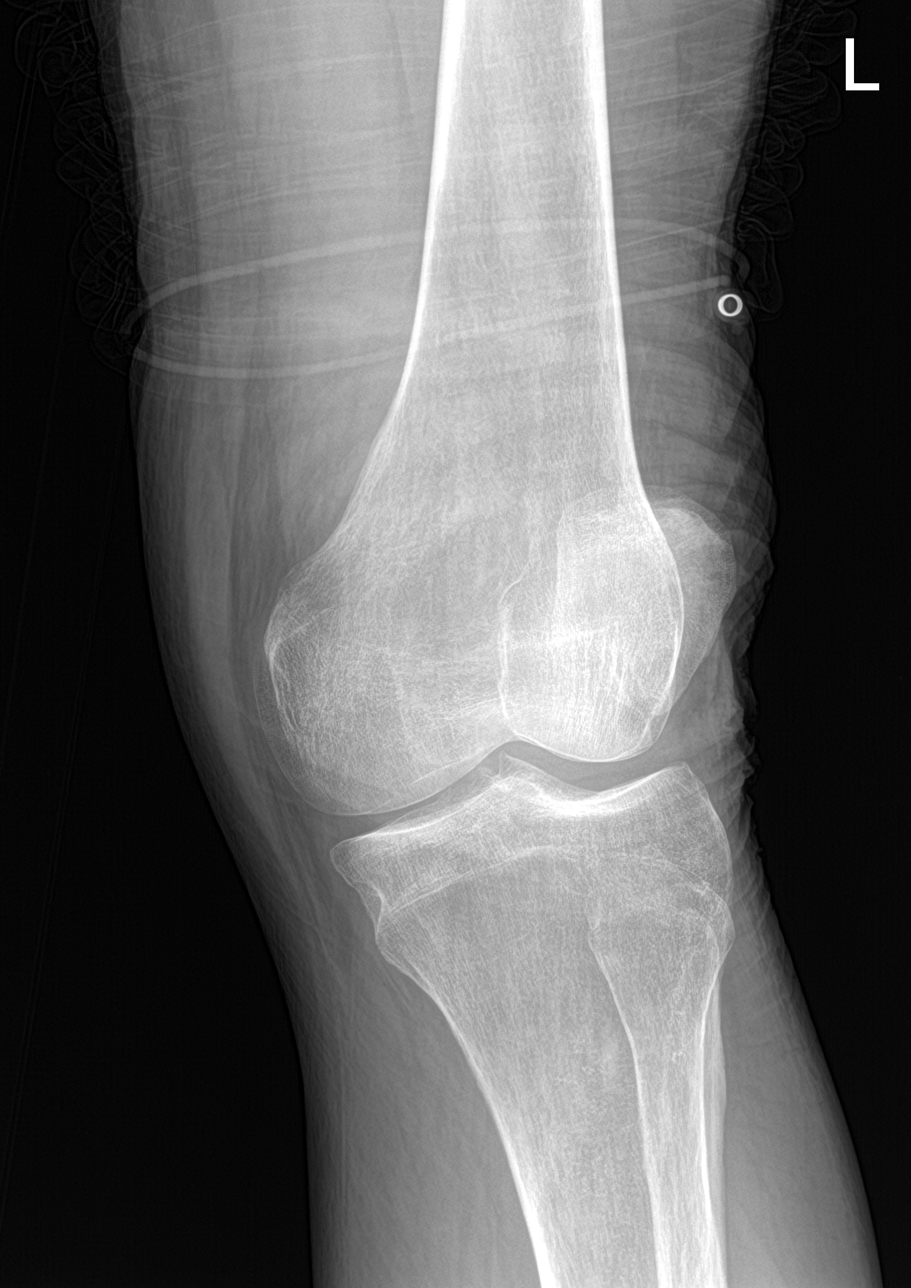

[knee obl (2 of 2)]
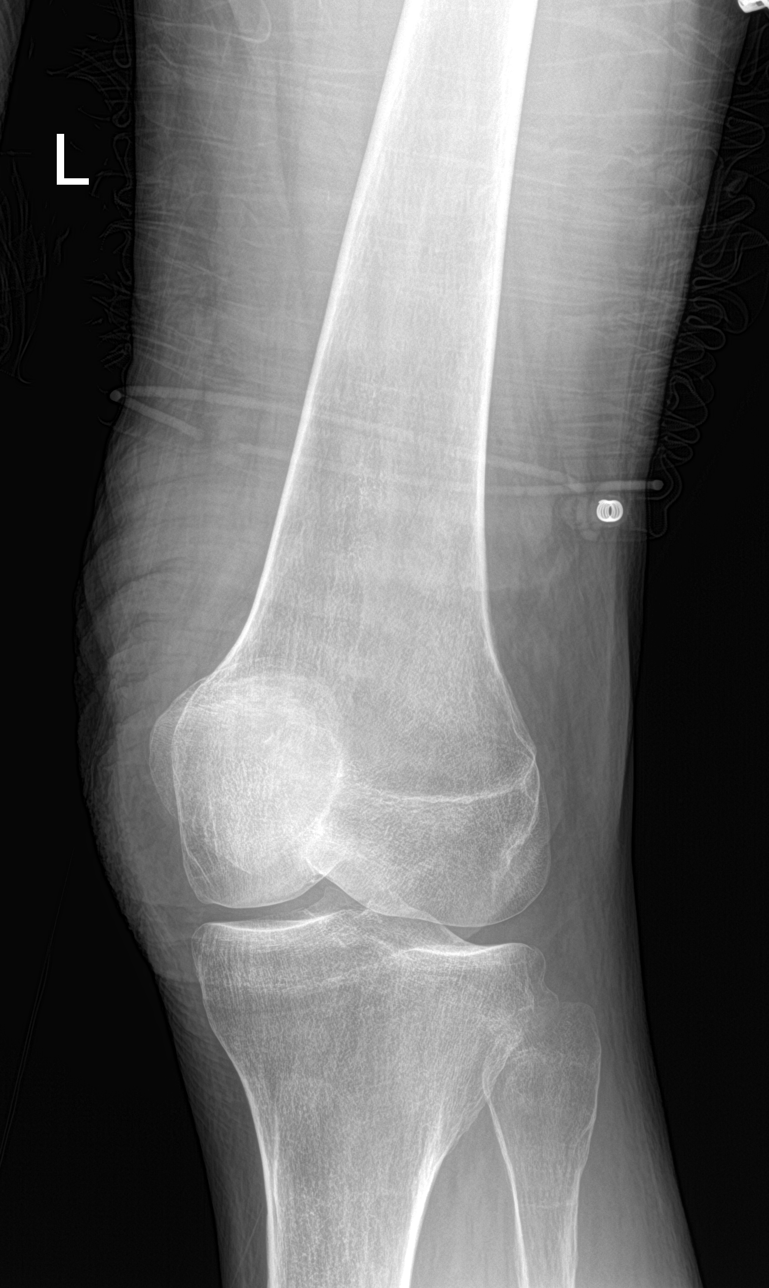

[knee ap]
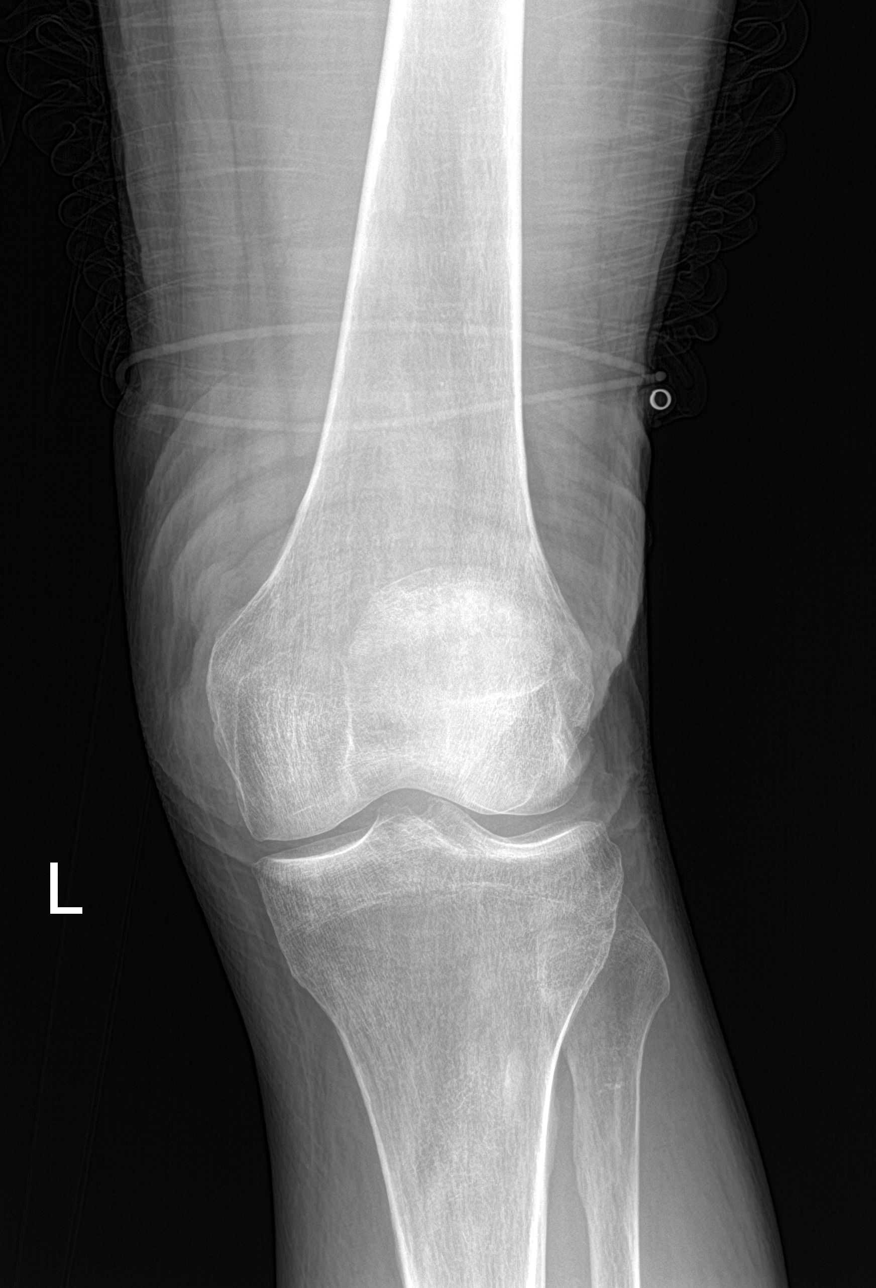

[4 of 4 positions shown; findings below may reference images not displayed]

FINDINGS: No evidence of fracture or dislocation. Small joint effusion. No
evidence of significant arthropathy or other focal bone abnormality.
Soft tissues are unremarkable.
IMPRESSION: Small knee joint effusion without acute osseous abnormality.

## 2022-02-02 DIAGNOSIS — H9313 Tinnitus, bilateral: Secondary | ICD-10-CM | POA: Insufficient documentation

## 2022-02-02 DIAGNOSIS — H903 Sensorineural hearing loss, bilateral: Secondary | ICD-10-CM | POA: Insufficient documentation

## 2022-02-03 ENCOUNTER — Encounter (HOSPITAL_BASED_OUTPATIENT_CLINIC_OR_DEPARTMENT_OTHER): Payer: Medicare Other | Admitting: General Surgery

## 2022-02-03 ENCOUNTER — Ambulatory Visit (HOSPITAL_BASED_OUTPATIENT_CLINIC_OR_DEPARTMENT_OTHER): Payer: Medicare Other | Admitting: General Surgery

## 2022-02-03 ENCOUNTER — Other Ambulatory Visit: Payer: Self-pay

## 2022-02-03 DIAGNOSIS — L97522 Non-pressure chronic ulcer of other part of left foot with fat layer exposed: Secondary | ICD-10-CM | POA: Diagnosis not present

## 2022-02-04 NOTE — Progress Notes (Signed)
currently denies any issues. 2/28; patient presents for follow-up. He denies signs of infection. He reports using blast X with collagen to the wound bed. 3/7; patient presents for follow-up. He states he has been using gentamicin ointment and urea cream with dressing changes. He reports that the back of his right knee has developed an abscess again and is draining. He states this started today. 02/03/2022: The patient came to clinic today without any dressing on his foot. He says that he took a shower yesterday and felt like he did not need a dressing and so he did not apply it. He is not offloading this foot and is wearing a regular athletic shoe. Electronic Signature(s) Signed: 02/03/2022 6:01:23 PM By: Duanne Guess MD FACS Entered By: Duanne Guess on 02/03/2022 18:01:23 -------------------------------------------------------------------------------- Physical Exam Details Patient Name: Date of Service: [name], [name] 02/03/2022 12:30 PM Medical Record Number: [id number] Patient Account Number: [id number] Date of Birth/Sex: Treating RN: 22-Nov-1971 (51 y.o. M) Primary Care Provider: Lance Bosch Other Clinician: Referring Provider: Treating Provider/Extender: Polly Cobia in Treatment: 68 Constitutional . . . . No acute distress. Respiratory Normal work of breathing on room air. Notes 02/03/2022: Wound examon the first metatarsal head on the left foot, there is a large  circumferential callus with a deep slit at the base. This probes fairly deeply, but does not yet reach bone. No purulent drainage or other sign of infection. Electronic Signature(s) Signed: 02/03/2022 6:03:15 PM By: Duanne Guess MD FACS Entered By: Duanne Guess on 02/03/2022 18:03:14 -------------------------------------------------------------------------------- Physician Orders Details Patient Name: Date of Service: [name], [name] 02/03/2022 12:30 PM Medical Record Number: [id number] Patient Account Number: [id number] Date of Birth/Sex: Treating RN: 17-Aug-1971 (51 y.o. Harlon Flor, Millard.Loa Primary Care Provider: Other Clinician: Lance Bosch Referring Provider: Treating Provider/Extender: Polly Cobia in Treatment: 23 Verbal / Phone Orders: No Diagnosis Coding ICD-10 Coding Code Description [phone] Non-pressure chronic ulcer of other part of left foot with fat layer exposed E11.621 Type 2 diabetes mellitus with foot ulcer E11.42 Type 2 diabetes mellitus with diabetic polyneuropathy Z89.511 Acquired absence of right leg below knee L02.415 Cutaneous abscess of right lower limb Follow-up Appointments ppointment in 1 week. - with Dr. Mikey Bussing Room 8, [place] Return A Other: - Ensure to offload and dressing changes as it is important to aid in wound healing. Edema Control - Lymphedema / SCD / Other Elevate legs to the level of the heart or above for 30 minutes daily and/or when sitting, a frequency of: - throughout the day Avoid standing for long periods of time. Off-Loading Other: - minimize walking on left foot related to pressure to wound. Aid in wound healing. Wound Treatment Wound #2R - Metatarsal head first Wound Laterality: Left Cleanser: Soap and Water 1 x Per Day/30 Days Discharge Instructions: May shower and wash wound with dial antibacterial soap and water prior to dressing change. Cleanser: Wound Cleanser (Generic) 1 x Per Day/30  Days Discharge Instructions: Cleanse the wound with wound cleanser prior to applying a clean dressing using gauze sponges, not tissue or cotton balls. Peri-Wound Care: 40% Urea Cream 1 x Per Day/30 Days Discharge Instructions: Apply to callous around wound daily Topical: Gentamicin 1 x Per Day/30 Days Discharge Instructions: apply directly to wound. apply Q-tip to apply directly into wound bed. Secondary Dressing: Woven Gauze Sponges 2x2 in (Generic) 1 x Per Day/30 Days Discharge Instructions: Apply over primary dressing as directed. Secondary Dressing: Optifoam Non-Adhesive Dressing, 4x4 in (  currently denies any issues. 2/28; patient presents for follow-up. He denies signs of infection. He reports using blast X with collagen to the wound bed. 3/7; patient presents for follow-up. He states he has been using gentamicin ointment and urea cream with dressing changes. He reports that the back of his right knee has developed an abscess again and is draining. He states this started today. 02/03/2022: The patient came to clinic today without any dressing on his foot. He says that he took a shower yesterday and felt like he did not need a dressing and so he did not apply it. He is not offloading this foot and is wearing a regular athletic shoe. Electronic Signature(s) Signed: 02/03/2022 6:01:23 PM By: Duanne Guess MD FACS Entered By: Duanne Guess on 02/03/2022 18:01:23 -------------------------------------------------------------------------------- Physical Exam Details Patient Name: Date of Service: [name], [name] 02/03/2022 12:30 PM Medical Record Number: [id number] Patient Account Number: [id number] Date of Birth/Sex: Treating RN: 22-Nov-1971 (51 y.o. M) Primary Care Provider: Lance Bosch Other Clinician: Referring Provider: Treating Provider/Extender: Polly Cobia in Treatment: 68 Constitutional . . . . No acute distress. Respiratory Normal work of breathing on room air. Notes 02/03/2022: Wound examon the first metatarsal head on the left foot, there is a large  circumferential callus with a deep slit at the base. This probes fairly deeply, but does not yet reach bone. No purulent drainage or other sign of infection. Electronic Signature(s) Signed: 02/03/2022 6:03:15 PM By: Duanne Guess MD FACS Entered By: Duanne Guess on 02/03/2022 18:03:14 -------------------------------------------------------------------------------- Physician Orders Details Patient Name: Date of Service: [name], [name] 02/03/2022 12:30 PM Medical Record Number: [id number] Patient Account Number: [id number] Date of Birth/Sex: Treating RN: 17-Aug-1971 (51 y.o. Harlon Flor, Millard.Loa Primary Care Provider: Other Clinician: Lance Bosch Referring Provider: Treating Provider/Extender: Polly Cobia in Treatment: 23 Verbal / Phone Orders: No Diagnosis Coding ICD-10 Coding Code Description [phone] Non-pressure chronic ulcer of other part of left foot with fat layer exposed E11.621 Type 2 diabetes mellitus with foot ulcer E11.42 Type 2 diabetes mellitus with diabetic polyneuropathy Z89.511 Acquired absence of right leg below knee L02.415 Cutaneous abscess of right lower limb Follow-up Appointments ppointment in 1 week. - with Dr. Mikey Bussing Room 8, [place] Return A Other: - Ensure to offload and dressing changes as it is important to aid in wound healing. Edema Control - Lymphedema / SCD / Other Elevate legs to the level of the heart or above for 30 minutes daily and/or when sitting, a frequency of: - throughout the day Avoid standing for long periods of time. Off-Loading Other: - minimize walking on left foot related to pressure to wound. Aid in wound healing. Wound Treatment Wound #2R - Metatarsal head first Wound Laterality: Left Cleanser: Soap and Water 1 x Per Day/30 Days Discharge Instructions: May shower and wash wound with dial antibacterial soap and water prior to dressing change. Cleanser: Wound Cleanser (Generic) 1 x Per Day/30  Days Discharge Instructions: Cleanse the wound with wound cleanser prior to applying a clean dressing using gauze sponges, not tissue or cotton balls. Peri-Wound Care: 40% Urea Cream 1 x Per Day/30 Days Discharge Instructions: Apply to callous around wound daily Topical: Gentamicin 1 x Per Day/30 Days Discharge Instructions: apply directly to wound. apply Q-tip to apply directly into wound bed. Secondary Dressing: Woven Gauze Sponges 2x2 in (Generic) 1 x Per Day/30 Days Discharge Instructions: Apply over primary dressing as directed. Secondary Dressing: Optifoam Non-Adhesive Dressing, 4x4 in (  Generic) 1 x Per Day/30 Days Discharge Instructions: cut to make foam donut to help offload Secured With: Kerlix Roll Sterile, 4.5x3.1 (in/yd) (Generic) 1 x Per Day/30 Days Discharge Instructions: Secure with Kerlix as directed. Secured With: 1M Medipore H Soft Cloth Surgical Tape, 2x2 (in/yd) (Generic) 1 x Per Day/30 Days Discharge Instructions: Secure dressing with tape as directed. Electronic Signature(s) Signed: 02/04/2022 6:42:44 PM By: Duanne Guess MD FACS Previous Signature: 02/03/2022 5:36:50 PM Version By: Shawn Stall RN, BSN Entered By: Duanne Guess on 02/03/2022 18:06:45 -------------------------------------------------------------------------------- Problem List Details Patient Name: Date of Service: [name], [name] 02/03/2022 12:30 PM Medical Record Number: [id number] Patient Account Number: [id number] Date of Birth/Sex: Treating RN: 04-27-1971 (51 y.o. [name] Primary Care Provider: Lance Bosch Other Clinician: Referring Provider: Treating Provider/Extender: Polly Cobia in Treatment: 32 Active Problems ICD-10 Encounter Code Description Active Date MDM Diagnosis [phone] Non-pressure chronic ulcer of other part of left foot with fat layer exposed 08/16/2020 No Yes E11.621 Type 2 diabetes mellitus with foot ulcer 08/16/2020 No  Yes E11.42 Type 2 diabetes mellitus with diabetic polyneuropathy 08/16/2020 No Yes Z89.511 Acquired absence of right leg below knee 08/16/2020 No Yes L02.415 Cutaneous abscess of right lower limb 01/27/2022 No Yes Inactive Problems Resolved Problems ICD-10 Code Description Active Date Resolved Date S81.801D Unspecified open wound, right lower leg, subsequent encounter 04/08/2021 04/08/2021 Electronic Signature(s) Signed: 02/03/2022 5:58:39 PM By: Duanne Guess MD FACS Previous Signature: 02/03/2022 5:36:50 PM Version By: Shawn Stall RN, BSN Entered By: Duanne Guess on 02/03/2022 17:58:39 -------------------------------------------------------------------------------- Progress Note Details Patient Name: Date of Service: [name], [name] 02/03/2022 12:30 PM Medical Record Number: [id number] Patient Account Number: [id number] Date of Birth/Sex: Treating RN: 11-Jan-1971 (51 y.o. M) Primary Care Provider: Lance Bosch Other Clinician: Referring Provider: Treating Provider/Extender: Polly Cobia in Treatment: 55 Subjective Chief Complaint Information obtained from Patient patient is here for a review of the wound on his plantar left first metatarsal head and back of his right leg History of Present Illness (HPI) 04/21/18 ADMISSION This is a 51 year old man who is a type II diabetic. In spite of fact his hemoglobin A1c is actually quite good 5.73 months ago he is at a really difficult time over the last 6-7 months. He developed a rapidly progressive infection in the right foot in November 2018 associated with osteomyelitis and necrotizing fasciitis. 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 The stump required and a revision on 11/24/17. The stump revision was advertised as being secondary to falls. I'm not sure the progressive history here however this area is actually closed over. The patient tells me over the same timeframe he has had wounds on the plantar  aspect of his left foot. In the ED saw Dr. Lajoyce Corners in April of this year. 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 It is really not clear that this patient is been dressing this with anything. He came in with the clinic without any specific dressing on the wound areas using his own tennis shoe. He tells me that he only ambulates of course to do a pivot transfer. He does not have his prosthesis for the right leg as of yet and he blames Medicaid for this. He does however use the foot to push himself along in his wheelchair at home. The patient has not had formal arterial studies. ABI in our clinic on the left was 1.13. Patient's past  problems/congestion; Middle ear problems Hematologic/Lymphatic Medical History: Positive for: Anemia Negative for: Hemophilia; Human Immunodeficiency Virus; Lymphedema; Sickle Cell Disease Respiratory Medical History: Negative for: Aspiration; Asthma; Chronic Obstructive Pulmonary Disease (COPD); Pneumothorax; Sleep Apnea; Tuberculosis Past Medical History Notes: During waking hours and catches himself not breathing, "gasp for air". Saw Dr Donnie Aho, he couldn't find a reason Cardiovascular Medical History: Positive for: Hypertension; Peripheral  Venous Disease Negative for: Angina; Arrhythmia; Congestive Heart Failure; Coronary Artery Disease; Deep Vein Thrombosis; Hypotension; Myocardial Infarction; Peripheral Arterial Disease; Phlebitis; Vasculitis Gastrointestinal Medical History: Negative for: Cirrhosis ; Colitis; Crohns; Hepatitis A; Hepatitis B; Hepatitis C Endocrine Medical History: Positive for: Type II Diabetes - oral meds Negative for: Type I Diabetes Time with diabetes: 2014 Treated with: Oral agents Blood sugar tested every day: Yes Tested : Blood sugar testing results: Breakfast: 91 Genitourinary Medical History: Negative for: End Stage Renal Disease Immunological Medical History: Negative for: Lupus Erythematosus; Raynauds; Scleroderma Integumentary (Skin) Medical History: Negative for: History of Burn Musculoskeletal Medical History: Negative for: Gout; Rheumatoid Arthritis; Osteoarthritis; Osteomyelitis Neurologic Medical History: Negative for: Dementia; Neuropathy; Quadriplegia; Paraplegia; Seizure Disorder Oncologic Medical History: Negative for: Received Chemotherapy; Received Radiation Psychiatric Medical History: Negative for: Anorexia/bulimia; Confinement Anxiety Immunizations Pneumococcal Vaccine: Received Pneumococcal Vaccination: No Implantable Devices No devices added Hospitalization / Surgery History Type of Hospitalization/Surgery fever 105, sepsis cellulitis right popliteal fossa 04/09/2021 Family and Social History Cancer: No; Diabetes: Yes - Maternal Grandparents,Mother; Heart Disease: Yes - Father; Hereditary Spherocytosis: No; Hypertension: Yes - Father; Kidney Disease: No; Lung Disease: No; Seizures: No; Stroke: No; Thyroid Problems: No; Tuberculosis: No; Current every day smoker; Marital Status - Single; Alcohol Use: Never; Drug Use: No History; Caffeine Use: Never; Financial Concerns: No; Food, Clothing or Shelter Needs: No; Support System Lacking: No; Transportation  Concerns: No Electronic Signature(s) Signed: 02/04/2022 6:42:44 PM By: Duanne Guess MD FACS Entered By: Duanne Guess on 02/03/2022 18:01:35 -------------------------------------------------------------------------------- SuperBill Details Patient Name: Date of Service: [name], [name] 02/03/2022 Medical Record Number: [id number] Patient Account Number: [id number] Date of Birth/Sex: Treating RN: 1971/02/27 (51 y.o. Harlon Flor, Millard.Loa Primary Care Provider: Lance Bosch Other Clinician: Referring Provider: Treating Provider/Extender: Polly Cobia in Treatment: 76 Diagnosis Coding ICD-10 Codes Code Description [phone] Non-pressure chronic ulcer of other part of left foot with fat layer exposed E11.621 Type 2 diabetes mellitus with foot ulcer E11.42 Type 2 diabetes mellitus with diabetic polyneuropathy Z89.511 Acquired absence of right leg below knee L02.415 Cutaneous abscess of right lower limb Facility Procedures CPT4 Code: [id number] Description: [phone] - DEBRIDE WOUND 1ST 20 SQ CM OR < ICD-10 Diagnosis Description L97.522 Non-pressure chronic ulcer of other part of left foot with fat layer exposed Modifier: Quantity: 1 Physician Procedures : CPT4 Code Description Modifier [id number] - WC PHYS DEBR WO ANESTH 20 SQ CM ICD-10 Diagnosis Description L97.522 Non-pressure chronic ulcer of other part of left foot with fat layer exposed Quantity: 1 Electronic Signature(s) Signed: 02/03/2022 6:08:11 PM By: Duanne Guess MD FACS Previous Signature: 02/03/2022 5:36:50 PM Version By: Shawn Stall RN, BSN Previous Signature: 02/03/2022 5:36:50 PM Version By: Shawn Stall RN, BSN Entered By: Duanne Guess on 02/03/2022 18:08:11  Generic) 1 x Per Day/30 Days Discharge Instructions: cut to make foam donut to help offload Secured With: Kerlix Roll Sterile, 4.5x3.1 (in/yd) (Generic) 1 x Per Day/30 Days Discharge Instructions: Secure with Kerlix as directed. Secured With: 1M Medipore H Soft Cloth Surgical Tape, 2x2 (in/yd) (Generic) 1 x Per Day/30 Days Discharge Instructions: Secure dressing with tape as directed. Electronic Signature(s) Signed: 02/04/2022 6:42:44 PM By: Duanne Guess MD FACS Previous Signature: 02/03/2022 5:36:50 PM Version By: Shawn Stall RN, BSN Entered By: Duanne Guess on 02/03/2022 18:06:45 -------------------------------------------------------------------------------- Problem List Details Patient Name: Date of Service: [name], [name] 02/03/2022 12:30 PM Medical Record Number: [id number] Patient Account Number: [id number] Date of Birth/Sex: Treating RN: 04-27-1971 (51 y.o. [name] Primary Care Provider: Lance Bosch Other Clinician: Referring Provider: Treating Provider/Extender: Polly Cobia in Treatment: 32 Active Problems ICD-10 Encounter Code Description Active Date MDM Diagnosis [phone] Non-pressure chronic ulcer of other part of left foot with fat layer exposed 08/16/2020 No Yes E11.621 Type 2 diabetes mellitus with foot ulcer 08/16/2020 No  Yes E11.42 Type 2 diabetes mellitus with diabetic polyneuropathy 08/16/2020 No Yes Z89.511 Acquired absence of right leg below knee 08/16/2020 No Yes L02.415 Cutaneous abscess of right lower limb 01/27/2022 No Yes Inactive Problems Resolved Problems ICD-10 Code Description Active Date Resolved Date S81.801D Unspecified open wound, right lower leg, subsequent encounter 04/08/2021 04/08/2021 Electronic Signature(s) Signed: 02/03/2022 5:58:39 PM By: Duanne Guess MD FACS Previous Signature: 02/03/2022 5:36:50 PM Version By: Shawn Stall RN, BSN Entered By: Duanne Guess on 02/03/2022 17:58:39 -------------------------------------------------------------------------------- Progress Note Details Patient Name: Date of Service: [name], [name] 02/03/2022 12:30 PM Medical Record Number: [id number] Patient Account Number: [id number] Date of Birth/Sex: Treating RN: 11-Jan-1971 (51 y.o. M) Primary Care Provider: Lance Bosch Other Clinician: Referring Provider: Treating Provider/Extender: Polly Cobia in Treatment: 55 Subjective Chief Complaint Information obtained from Patient patient is here for a review of the wound on his plantar left first metatarsal head and back of his right leg History of Present Illness (HPI) 04/21/18 ADMISSION This is a 51 year old man who is a type II diabetic. In spite of fact his hemoglobin A1c is actually quite good 5.73 months ago he is at a really difficult time over the last 6-7 months. He developed a rapidly progressive infection in the right foot in November 2018 associated with osteomyelitis and necrotizing fasciitis. 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 The stump required and a revision on 11/24/17. The stump revision was advertised as being secondary to falls. I'm not sure the progressive history here however this area is actually closed over. The patient tells me over the same timeframe he has had wounds on the plantar  aspect of his left foot. In the ED saw Dr. Lajoyce Corners in April of this year. 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 It is really not clear that this patient is been dressing this with anything. He came in with the clinic without any specific dressing on the wound areas using his own tennis shoe. He tells me that he only ambulates of course to do a pivot transfer. He does not have his prosthesis for the right leg as of yet and he blames Medicaid for this. He does however use the foot to push himself along in his wheelchair at home. The patient has not had formal arterial studies. ABI in our clinic on the left was 1.13. Patient's past  BLAYTON, HUTTNER ([id number]) Visit Report for 02/03/2022 Chief Complaint Document Details Patient Name: Date of Service: Harriet Pho 02/03/2022 12:30 PM Medical Record Number: [id number] Patient Account Number: [id number] Date of Birth/Sex: Treating RN: 14-Oct-1971 (51 y.o. M) Primary Care Provider: Lance Bosch Other Clinician: Referring Provider: Treating Provider/Extender: Polly Cobia in Treatment: 29 Information Obtained from: Patient Chief Complaint patient is here for a review of the wound on his plantar left first metatarsal head and back of his right leg Electronic Signature(s) Signed: 02/03/2022 5:59:20 PM By: Duanne Guess MD FACS Entered By: Duanne Guess on 02/03/2022 17:59:20 -------------------------------------------------------------------------------- Debridement Details Patient Name: Date of Service: Bertram Millard ND, [name] 02/03/2022 12:30 PM Medical Record Number: [id number] Patient Account Number: [id number] Date of Birth/Sex: Treating RN: 23-May-1971 (51 y.o. Harlon Flor, Millard.Loa Primary Care Provider: Lance Bosch Other Clinician: Referring Provider: Treating Provider/Extender: Polly Cobia in Treatment: 76 Debridement Performed for Assessment: Wound #2R Left Metatarsal head first Performed By: Physician Duanne Guess, MD Debridement Type: Debridement Severity of Tissue Pre Debridement: Fat layer exposed Level of Consciousness (Pre-procedure): Awake and Alert Pre-procedure Verification/Time Out Yes - 12:58 Taken: Start Time: 12:59 Pain Control: Lidocaine 5% topical ointment T Area Debrided (L x W): otal 2 (cm) x 2 (cm) = 4 (cm) Tissue and other material debrided: Non-Viable, Callus, Skin: Dermis , Skin: Epidermis Level: Skin/Epidermis Debridement Description: Selective/Open Wound Instrument: Curette Bleeding: Minimum Hemostasis Achieved: Silver Nitrate End Time:  13:03 Procedural Pain: 0 Post Procedural Pain: 0 Response to Treatment: Procedure was tolerated well Level of Consciousness (Post- Awake and Alert procedure): Post Debridement Measurements of Total Wound Length: (cm) 1 Width: (cm) 0.2 Depth: (cm) 0.4 Volume: (cm) 0.063 Character of Wound/Ulcer Post Debridement: Improved Severity of Tissue Post Debridement: Fat layer exposed Post Procedure Diagnosis Same as Pre-procedure Electronic Signature(s) Signed: 02/03/2022 5:36:50 PM By: Shawn Stall RN, BSN Signed: 02/04/2022 6:42:44 PM By: Duanne Guess MD FACS Entered By: Shawn Stall on 02/03/2022 13:07:01 -------------------------------------------------------------------------------- HPI Details Patient Name: Date of Service: [name], [name] 02/03/2022 12:30 PM Medical Record Number: [id number] Patient Account Number: [id number] Date of Birth/Sex: Treating RN: 04-15-1971 (51 y.o. M) Primary Care Provider: Lance Bosch Other Clinician: Referring Provider: Treating Provider/Extender: Polly Cobia in Treatment: 108 History of Present Illness HPI Description: 04/21/18 ADMISSION This is a 51 year old man who is a type II diabetic. In spite of fact his hemoglobin A1c is actually quite good 5.73 months ago he is at a really difficult time over the last 6-7 months. He developed a rapidly progressive infection in the right foot in November 2018 associated with osteomyelitis and necrotizing fasciitis. 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 The stump required and a revision on 11/24/17. The stump revision was advertised as being secondary to falls. I'm not sure the progressive history here however this area is actually closed over. The patient tells me over the same timeframe he has had wounds on the plantar aspect of his left foot. In the ED saw Dr. Lajoyce Corners in April of this year. 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 It is really not clear that this patient is been dressing this with anything. He came in with the clinic without any specific dressing on the wound areas using his own tennis shoe. He tells me that he only ambulates of course to do a pivot transfer. He does not have his prosthesis  Pre-procedure diagnosis of Wound #2R is a Diabetic Wound/Ulcer of the Lower Extremity located on the Left Metatarsal head first .Severity of Tissue Pre Debridement is: Fat layer exposed. There was a Selective/Open Wound Skin/Epidermis Debridement with a total area of 4 sq cm performed by Duanne Guess, MD. With the following instrument(s): Curette to remove Non-Viable tissue/material. Material removed includes Callus, Skin: Dermis, and Skin: Epidermis after achieving pain control using Lidocaine 5% topical ointment. A time out was conducted at 12:58, prior to the start of the procedure. A Minimum amount of bleeding was controlled with Silver Nitrate. The procedure was tolerated well with a  pain level of 0 throughout and a pain level of 0 following the procedure. Post Debridement Measurements: 1cm length x 0.2cm width x 0.4cm depth; 0.063cm^3 volume. Character of Wound/Ulcer Post Debridement is improved. Severity of Tissue Post Debridement is: Fat layer exposed. Post procedure Diagnosis Wound #2R: Same as Pre-Procedure Plan Follow-up Appointments: Return Appointment in 1 week. - with Dr. Mikey Bussing Room 8, Yvonne Kendall Other: - Ensure to offload and dressing changes as it is important to aid in wound healing. Edema Control - Lymphedema / SCD / Other: Elevate legs to the level of the heart or above for 30 minutes daily and/or when sitting, a frequency of: - throughout the day Avoid standing for long periods of time. Off-Loading: Other: - minimize walking on left foot related to pressure to wound. Aid in wound healing. WOUND #2R: - Metatarsal head first Wound Laterality: Left Cleanser: Soap and Water 1 x Per Day/30 Days Discharge Instructions: May shower and wash wound with dial antibacterial soap and water prior to dressing change. Cleanser: Wound Cleanser (Generic) 1 x Per Day/30 Days Discharge Instructions: Cleanse the wound with wound cleanser prior to applying a clean dressing using gauze sponges, not tissue or cotton balls. Peri-Wound Care: 40% Urea Cream 1 x Per Day/30 Days Discharge Instructions: Apply to callous around wound daily Topical: Gentamicin 1 x Per Day/30 Days Discharge Instructions: apply directly to wound. apply Q-tip to apply directly into wound bed. Secondary Dressing: Woven Gauze Sponges 2x2 in (Generic) 1 x Per Day/30 Days Discharge Instructions: Apply over primary dressing as directed. Secondary Dressing: Optifoam Non-Adhesive Dressing, 4x4 in (Generic) 1 x Per Day/30 Days Discharge Instructions: cut to make foam donut to help offload Secured With: Kerlix Roll Sterile, 4.5x3.1 (in/yd) (Generic) 1 x Per Day/30 Days Discharge Instructions: Secure with Kerlix as  directed. Secured With: 64M Medipore H Soft Cloth Surgical T ape, 2x2 (in/yd) (Generic) 1 x Per Day/30 Days Discharge Instructions: Secure dressing with tape as directed. 02/03/2022: The patient is not really offloading his wound and it is deeper today. It probes not quite to bone but I am concerned that this is a risk given his relative noncompliance. I debrided a large amount of callus from the periwound. No gross signs of infection. Continue gentamicin. Follow-up with Dr. Mikey Bussing. Electronic Signature(s) Signed: 02/03/2022 6:08:03 PM By: Duanne Guess MD FACS Entered By: Duanne Guess on 02/03/2022 18:08:02 -------------------------------------------------------------------------------- HxROS Details Patient Name: Date of Service: [name], [name] 02/03/2022 12:30 PM Medical Record Number: [id number] Patient Account Number: [id number] Date of Birth/Sex: Treating RN: 1971-02-07 (51 y.o. M) Primary Care Provider: Lance Bosch Other Clinician: Referring Provider: Treating Provider/Extender: Polly Cobia in Treatment: 44 Information Obtained From Patient Constitutional Symptoms (General Health) Medical History: Past Medical History Notes: falls , leukocytosis Eyes Medical History: Negative for: Cataracts; Glaucoma; Optic Neuritis Ear/Nose/Mouth/Throat Medical History: Negative for: Chronic sinus  Pre-procedure diagnosis of Wound #2R is a Diabetic Wound/Ulcer of the Lower Extremity located on the Left Metatarsal head first .Severity of Tissue Pre Debridement is: Fat layer exposed. There was a Selective/Open Wound Skin/Epidermis Debridement with a total area of 4 sq cm performed by Duanne Guess, MD. With the following instrument(s): Curette to remove Non-Viable tissue/material. Material removed includes Callus, Skin: Dermis, and Skin: Epidermis after achieving pain control using Lidocaine 5% topical ointment. A time out was conducted at 12:58, prior to the start of the procedure. A Minimum amount of bleeding was controlled with Silver Nitrate. The procedure was tolerated well with a  pain level of 0 throughout and a pain level of 0 following the procedure. Post Debridement Measurements: 1cm length x 0.2cm width x 0.4cm depth; 0.063cm^3 volume. Character of Wound/Ulcer Post Debridement is improved. Severity of Tissue Post Debridement is: Fat layer exposed. Post procedure Diagnosis Wound #2R: Same as Pre-Procedure Plan Follow-up Appointments: Return Appointment in 1 week. - with Dr. Mikey Bussing Room 8, Yvonne Kendall Other: - Ensure to offload and dressing changes as it is important to aid in wound healing. Edema Control - Lymphedema / SCD / Other: Elevate legs to the level of the heart or above for 30 minutes daily and/or when sitting, a frequency of: - throughout the day Avoid standing for long periods of time. Off-Loading: Other: - minimize walking on left foot related to pressure to wound. Aid in wound healing. WOUND #2R: - Metatarsal head first Wound Laterality: Left Cleanser: Soap and Water 1 x Per Day/30 Days Discharge Instructions: May shower and wash wound with dial antibacterial soap and water prior to dressing change. Cleanser: Wound Cleanser (Generic) 1 x Per Day/30 Days Discharge Instructions: Cleanse the wound with wound cleanser prior to applying a clean dressing using gauze sponges, not tissue or cotton balls. Peri-Wound Care: 40% Urea Cream 1 x Per Day/30 Days Discharge Instructions: Apply to callous around wound daily Topical: Gentamicin 1 x Per Day/30 Days Discharge Instructions: apply directly to wound. apply Q-tip to apply directly into wound bed. Secondary Dressing: Woven Gauze Sponges 2x2 in (Generic) 1 x Per Day/30 Days Discharge Instructions: Apply over primary dressing as directed. Secondary Dressing: Optifoam Non-Adhesive Dressing, 4x4 in (Generic) 1 x Per Day/30 Days Discharge Instructions: cut to make foam donut to help offload Secured With: Kerlix Roll Sterile, 4.5x3.1 (in/yd) (Generic) 1 x Per Day/30 Days Discharge Instructions: Secure with Kerlix as  directed. Secured With: 64M Medipore H Soft Cloth Surgical T ape, 2x2 (in/yd) (Generic) 1 x Per Day/30 Days Discharge Instructions: Secure dressing with tape as directed. 02/03/2022: The patient is not really offloading his wound and it is deeper today. It probes not quite to bone but I am concerned that this is a risk given his relative noncompliance. I debrided a large amount of callus from the periwound. No gross signs of infection. Continue gentamicin. Follow-up with Dr. Mikey Bussing. Electronic Signature(s) Signed: 02/03/2022 6:08:03 PM By: Duanne Guess MD FACS Entered By: Duanne Guess on 02/03/2022 18:08:02 -------------------------------------------------------------------------------- HxROS Details Patient Name: Date of Service: [name], [name] 02/03/2022 12:30 PM Medical Record Number: [id number] Patient Account Number: [id number] Date of Birth/Sex: Treating RN: 1971-02-07 (51 y.o. M) Primary Care Provider: Lance Bosch Other Clinician: Referring Provider: Treating Provider/Extender: Polly Cobia in Treatment: 44 Information Obtained From Patient Constitutional Symptoms (General Health) Medical History: Past Medical History Notes: falls , leukocytosis Eyes Medical History: Negative for: Cataracts; Glaucoma; Optic Neuritis Ear/Nose/Mouth/Throat Medical History: Negative for: Chronic sinus  BLAYTON, HUTTNER ([id number]) Visit Report for 02/03/2022 Chief Complaint Document Details Patient Name: Date of Service: Harriet Pho 02/03/2022 12:30 PM Medical Record Number: [id number] Patient Account Number: [id number] Date of Birth/Sex: Treating RN: 14-Oct-1971 (51 y.o. M) Primary Care Provider: Lance Bosch Other Clinician: Referring Provider: Treating Provider/Extender: Polly Cobia in Treatment: 29 Information Obtained from: Patient Chief Complaint patient is here for a review of the wound on his plantar left first metatarsal head and back of his right leg Electronic Signature(s) Signed: 02/03/2022 5:59:20 PM By: Duanne Guess MD FACS Entered By: Duanne Guess on 02/03/2022 17:59:20 -------------------------------------------------------------------------------- Debridement Details Patient Name: Date of Service: Bertram Millard ND, [name] 02/03/2022 12:30 PM Medical Record Number: [id number] Patient Account Number: [id number] Date of Birth/Sex: Treating RN: 23-May-1971 (51 y.o. Harlon Flor, Millard.Loa Primary Care Provider: Lance Bosch Other Clinician: Referring Provider: Treating Provider/Extender: Polly Cobia in Treatment: 76 Debridement Performed for Assessment: Wound #2R Left Metatarsal head first Performed By: Physician Duanne Guess, MD Debridement Type: Debridement Severity of Tissue Pre Debridement: Fat layer exposed Level of Consciousness (Pre-procedure): Awake and Alert Pre-procedure Verification/Time Out Yes - 12:58 Taken: Start Time: 12:59 Pain Control: Lidocaine 5% topical ointment T Area Debrided (L x W): otal 2 (cm) x 2 (cm) = 4 (cm) Tissue and other material debrided: Non-Viable, Callus, Skin: Dermis , Skin: Epidermis Level: Skin/Epidermis Debridement Description: Selective/Open Wound Instrument: Curette Bleeding: Minimum Hemostasis Achieved: Silver Nitrate End Time:  13:03 Procedural Pain: 0 Post Procedural Pain: 0 Response to Treatment: Procedure was tolerated well Level of Consciousness (Post- Awake and Alert procedure): Post Debridement Measurements of Total Wound Length: (cm) 1 Width: (cm) 0.2 Depth: (cm) 0.4 Volume: (cm) 0.063 Character of Wound/Ulcer Post Debridement: Improved Severity of Tissue Post Debridement: Fat layer exposed Post Procedure Diagnosis Same as Pre-procedure Electronic Signature(s) Signed: 02/03/2022 5:36:50 PM By: Shawn Stall RN, BSN Signed: 02/04/2022 6:42:44 PM By: Duanne Guess MD FACS Entered By: Shawn Stall on 02/03/2022 13:07:01 -------------------------------------------------------------------------------- HPI Details Patient Name: Date of Service: [name], [name] 02/03/2022 12:30 PM Medical Record Number: [id number] Patient Account Number: [id number] Date of Birth/Sex: Treating RN: 04-15-1971 (51 y.o. M) Primary Care Provider: Lance Bosch Other Clinician: Referring Provider: Treating Provider/Extender: Polly Cobia in Treatment: 108 History of Present Illness HPI Description: 04/21/18 ADMISSION This is a 51 year old man who is a type II diabetic. In spite of fact his hemoglobin A1c is actually quite good 5.73 months ago he is at a really difficult time over the last 6-7 months. He developed a rapidly progressive infection in the right foot in November 2018 associated with osteomyelitis and necrotizing fasciitis. 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 The stump required and a revision on 11/24/17. The stump revision was advertised as being secondary to falls. I'm not sure the progressive history here however this area is actually closed over. The patient tells me over the same timeframe he has had wounds on the plantar aspect of his left foot. In the ED saw Dr. Lajoyce Corners in April of this year. 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 It is really not clear that this patient is been dressing this with anything. He came in with the clinic without any specific dressing on the wound areas using his own tennis shoe. He tells me that he only ambulates of course to do a pivot transfer. He does not have his prosthesis  BLAYTON, HUTTNER ([id number]) Visit Report for 02/03/2022 Chief Complaint Document Details Patient Name: Date of Service: Harriet Pho 02/03/2022 12:30 PM Medical Record Number: [id number] Patient Account Number: [id number] Date of Birth/Sex: Treating RN: 14-Oct-1971 (51 y.o. M) Primary Care Provider: Lance Bosch Other Clinician: Referring Provider: Treating Provider/Extender: Polly Cobia in Treatment: 29 Information Obtained from: Patient Chief Complaint patient is here for a review of the wound on his plantar left first metatarsal head and back of his right leg Electronic Signature(s) Signed: 02/03/2022 5:59:20 PM By: Duanne Guess MD FACS Entered By: Duanne Guess on 02/03/2022 17:59:20 -------------------------------------------------------------------------------- Debridement Details Patient Name: Date of Service: Bertram Millard ND, [name] 02/03/2022 12:30 PM Medical Record Number: [id number] Patient Account Number: [id number] Date of Birth/Sex: Treating RN: 23-May-1971 (51 y.o. Harlon Flor, Millard.Loa Primary Care Provider: Lance Bosch Other Clinician: Referring Provider: Treating Provider/Extender: Polly Cobia in Treatment: 76 Debridement Performed for Assessment: Wound #2R Left Metatarsal head first Performed By: Physician Duanne Guess, MD Debridement Type: Debridement Severity of Tissue Pre Debridement: Fat layer exposed Level of Consciousness (Pre-procedure): Awake and Alert Pre-procedure Verification/Time Out Yes - 12:58 Taken: Start Time: 12:59 Pain Control: Lidocaine 5% topical ointment T Area Debrided (L x W): otal 2 (cm) x 2 (cm) = 4 (cm) Tissue and other material debrided: Non-Viable, Callus, Skin: Dermis , Skin: Epidermis Level: Skin/Epidermis Debridement Description: Selective/Open Wound Instrument: Curette Bleeding: Minimum Hemostasis Achieved: Silver Nitrate End Time:  13:03 Procedural Pain: 0 Post Procedural Pain: 0 Response to Treatment: Procedure was tolerated well Level of Consciousness (Post- Awake and Alert procedure): Post Debridement Measurements of Total Wound Length: (cm) 1 Width: (cm) 0.2 Depth: (cm) 0.4 Volume: (cm) 0.063 Character of Wound/Ulcer Post Debridement: Improved Severity of Tissue Post Debridement: Fat layer exposed Post Procedure Diagnosis Same as Pre-procedure Electronic Signature(s) Signed: 02/03/2022 5:36:50 PM By: Shawn Stall RN, BSN Signed: 02/04/2022 6:42:44 PM By: Duanne Guess MD FACS Entered By: Shawn Stall on 02/03/2022 13:07:01 -------------------------------------------------------------------------------- HPI Details Patient Name: Date of Service: [name], [name] 02/03/2022 12:30 PM Medical Record Number: [id number] Patient Account Number: [id number] Date of Birth/Sex: Treating RN: 04-15-1971 (51 y.o. M) Primary Care Provider: Lance Bosch Other Clinician: Referring Provider: Treating Provider/Extender: Polly Cobia in Treatment: 108 History of Present Illness HPI Description: 04/21/18 ADMISSION This is a 51 year old man who is a type II diabetic. In spite of fact his hemoglobin A1c is actually quite good 5.73 months ago he is at a really difficult time over the last 6-7 months. He developed a rapidly progressive infection in the right foot in November 2018 associated with osteomyelitis and necrotizing fasciitis. 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 The stump required and a revision on 11/24/17. The stump revision was advertised as being secondary to falls. I'm not sure the progressive history here however this area is actually closed over. The patient tells me over the same timeframe he has had wounds on the plantar aspect of his left foot. In the ED saw Dr. Lajoyce Corners in April of this year. 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 It is really not clear that this patient is been dressing this with anything. He came in with the clinic without any specific dressing on the wound areas using his own tennis shoe. He tells me that he only ambulates of course to do a pivot transfer. He does not have his prosthesis  BLAYTON, HUTTNER ([id number]) Visit Report for 02/03/2022 Chief Complaint Document Details Patient Name: Date of Service: Harriet Pho 02/03/2022 12:30 PM Medical Record Number: [id number] Patient Account Number: [id number] Date of Birth/Sex: Treating RN: 14-Oct-1971 (51 y.o. M) Primary Care Provider: Lance Bosch Other Clinician: Referring Provider: Treating Provider/Extender: Polly Cobia in Treatment: 29 Information Obtained from: Patient Chief Complaint patient is here for a review of the wound on his plantar left first metatarsal head and back of his right leg Electronic Signature(s) Signed: 02/03/2022 5:59:20 PM By: Duanne Guess MD FACS Entered By: Duanne Guess on 02/03/2022 17:59:20 -------------------------------------------------------------------------------- Debridement Details Patient Name: Date of Service: Bertram Millard ND, [name] 02/03/2022 12:30 PM Medical Record Number: [id number] Patient Account Number: [id number] Date of Birth/Sex: Treating RN: 23-May-1971 (51 y.o. Harlon Flor, Millard.Loa Primary Care Provider: Lance Bosch Other Clinician: Referring Provider: Treating Provider/Extender: Polly Cobia in Treatment: 76 Debridement Performed for Assessment: Wound #2R Left Metatarsal head first Performed By: Physician Duanne Guess, MD Debridement Type: Debridement Severity of Tissue Pre Debridement: Fat layer exposed Level of Consciousness (Pre-procedure): Awake and Alert Pre-procedure Verification/Time Out Yes - 12:58 Taken: Start Time: 12:59 Pain Control: Lidocaine 5% topical ointment T Area Debrided (L x W): otal 2 (cm) x 2 (cm) = 4 (cm) Tissue and other material debrided: Non-Viable, Callus, Skin: Dermis , Skin: Epidermis Level: Skin/Epidermis Debridement Description: Selective/Open Wound Instrument: Curette Bleeding: Minimum Hemostasis Achieved: Silver Nitrate End Time:  13:03 Procedural Pain: 0 Post Procedural Pain: 0 Response to Treatment: Procedure was tolerated well Level of Consciousness (Post- Awake and Alert procedure): Post Debridement Measurements of Total Wound Length: (cm) 1 Width: (cm) 0.2 Depth: (cm) 0.4 Volume: (cm) 0.063 Character of Wound/Ulcer Post Debridement: Improved Severity of Tissue Post Debridement: Fat layer exposed Post Procedure Diagnosis Same as Pre-procedure Electronic Signature(s) Signed: 02/03/2022 5:36:50 PM By: Shawn Stall RN, BSN Signed: 02/04/2022 6:42:44 PM By: Duanne Guess MD FACS Entered By: Shawn Stall on 02/03/2022 13:07:01 -------------------------------------------------------------------------------- HPI Details Patient Name: Date of Service: [name], [name] 02/03/2022 12:30 PM Medical Record Number: [id number] Patient Account Number: [id number] Date of Birth/Sex: Treating RN: 04-15-1971 (51 y.o. M) Primary Care Provider: Lance Bosch Other Clinician: Referring Provider: Treating Provider/Extender: Polly Cobia in Treatment: 108 History of Present Illness HPI Description: 04/21/18 ADMISSION This is a 51 year old man who is a type II diabetic. In spite of fact his hemoglobin A1c is actually quite good 5.73 months ago he is at a really difficult time over the last 6-7 months. He developed a rapidly progressive infection in the right foot in November 2018 associated with osteomyelitis and necrotizing fasciitis. 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 The stump required and a revision on 11/24/17. The stump revision was advertised as being secondary to falls. I'm not sure the progressive history here however this area is actually closed over. The patient tells me over the same timeframe he has had wounds on the plantar aspect of his left foot. In the ED saw Dr. Lajoyce Corners in April of this year. 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 It is really not clear that this patient is been dressing this with anything. He came in with the clinic without any specific dressing on the wound areas using his own tennis shoe. He tells me that he only ambulates of course to do a pivot transfer. He does not have his prosthesis  Generic) 1 x Per Day/30 Days Discharge Instructions: cut to make foam donut to help offload Secured With: Kerlix Roll Sterile, 4.5x3.1 (in/yd) (Generic) 1 x Per Day/30 Days Discharge Instructions: Secure with Kerlix as directed. Secured With: 1M Medipore H Soft Cloth Surgical Tape, 2x2 (in/yd) (Generic) 1 x Per Day/30 Days Discharge Instructions: Secure dressing with tape as directed. Electronic Signature(s) Signed: 02/04/2022 6:42:44 PM By: Duanne Guess MD FACS Previous Signature: 02/03/2022 5:36:50 PM Version By: Shawn Stall RN, BSN Entered By: Duanne Guess on 02/03/2022 18:06:45 -------------------------------------------------------------------------------- Problem List Details Patient Name: Date of Service: [name], [name] 02/03/2022 12:30 PM Medical Record Number: [id number] Patient Account Number: [id number] Date of Birth/Sex: Treating RN: 04-27-1971 (51 y.o. [name] Primary Care Provider: Lance Bosch Other Clinician: Referring Provider: Treating Provider/Extender: Polly Cobia in Treatment: 32 Active Problems ICD-10 Encounter Code Description Active Date MDM Diagnosis [id number] Non-pressure chronic ulcer of other part of left foot with fat layer exposed 08/16/2020 No Yes E11.621 Type 2 diabetes mellitus with foot ulcer 08/16/2020 No  Yes E11.42 Type 2 diabetes mellitus with diabetic polyneuropathy 08/16/2020 No Yes Z89.511 Acquired absence of right leg below knee 08/16/2020 No Yes L02.415 Cutaneous abscess of right lower limb 01/27/2022 No Yes Inactive Problems Resolved Problems ICD-10 Code Description Active Date Resolved Date S81.801D Unspecified open wound, right lower leg, subsequent encounter 04/08/2021 04/08/2021 Electronic Signature(s) Signed: 02/03/2022 5:58:39 PM By: Duanne Guess MD FACS Previous Signature: 02/03/2022 5:36:50 PM Version By: Shawn Stall RN, BSN Entered By: Duanne Guess on 02/03/2022 17:58:39 -------------------------------------------------------------------------------- Progress Note Details Patient Name: Date of Service: [name], [name] 02/03/2022 12:30 PM Medical Record Number: [id number] Patient Account Number: [id number] Date of Birth/Sex: Treating RN: 11-Jan-1971 (51 y.o. M) Primary Care Provider: Lance Bosch Other Clinician: Referring Provider: Treating Provider/Extender: Polly Cobia in Treatment: 55 Subjective Chief Complaint Information obtained from Patient patient is here for a review of the wound on his plantar left first metatarsal head and back of his right leg History of Present Illness (HPI) 04/21/18 ADMISSION This is a 51 year old man who is a type II diabetic. In spite of fact his hemoglobin A1c is actually quite good 5.73 months ago he is at a really difficult time over the last 6-7 months. He developed a rapidly progressive infection in the right foot in November 2018 associated with osteomyelitis and necrotizing fasciitis. 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 The stump required and a revision on 11/24/17. The stump revision was advertised as being secondary to falls. I'm not sure the progressive history here however this area is actually closed over. The patient tells me over the same timeframe he has had wounds on the plantar  aspect of his left foot. In the ED saw Dr. Lajoyce Corners in April of this year. 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 It is really not clear that this patient is been dressing this with anything. He came in with the clinic without any specific dressing on the wound areas using his own tennis shoe. He tells me that he only ambulates of course to do a pivot transfer. He does not have his prosthesis for the right leg as of yet and he blames Medicaid for this. He does however use the foot to push himself along in his wheelchair at home. The patient has not had formal arterial studies. ABI in our clinic on the left was 1.13. Patient's past  BLAYTON, HUTTNER ([id number]) Visit Report for 02/03/2022 Chief Complaint Document Details Patient Name: Date of Service: Harriet Pho 02/03/2022 12:30 PM Medical Record Number: [id number] Patient Account Number: [id number] Date of Birth/Sex: Treating RN: 14-Oct-1971 (51 y.o. M) Primary Care Provider: Lance Bosch Other Clinician: Referring Provider: Treating Provider/Extender: Polly Cobia in Treatment: 29 Information Obtained from: Patient Chief Complaint patient is here for a review of the wound on his plantar left first metatarsal head and back of his right leg Electronic Signature(s) Signed: 02/03/2022 5:59:20 PM By: Duanne Guess MD FACS Entered By: Duanne Guess on 02/03/2022 17:59:20 -------------------------------------------------------------------------------- Debridement Details Patient Name: Date of Service: Bertram Millard ND, [name] 02/03/2022 12:30 PM Medical Record Number: [id number] Patient Account Number: [id number] Date of Birth/Sex: Treating RN: 23-May-1971 (51 y.o. Harlon Flor, Millard.Loa Primary Care Provider: Lance Bosch Other Clinician: Referring Provider: Treating Provider/Extender: Polly Cobia in Treatment: 76 Debridement Performed for Assessment: Wound #2R Left Metatarsal head first Performed By: Physician Duanne Guess, MD Debridement Type: Debridement Severity of Tissue Pre Debridement: Fat layer exposed Level of Consciousness (Pre-procedure): Awake and Alert Pre-procedure Verification/Time Out Yes - 12:58 Taken: Start Time: 12:59 Pain Control: Lidocaine 5% topical ointment T Area Debrided (L x W): otal 2 (cm) x 2 (cm) = 4 (cm) Tissue and other material debrided: Non-Viable, Callus, Skin: Dermis , Skin: Epidermis Level: Skin/Epidermis Debridement Description: Selective/Open Wound Instrument: Curette Bleeding: Minimum Hemostasis Achieved: Silver Nitrate End Time:  13:03 Procedural Pain: 0 Post Procedural Pain: 0 Response to Treatment: Procedure was tolerated well Level of Consciousness (Post- Awake and Alert procedure): Post Debridement Measurements of Total Wound Length: (cm) 1 Width: (cm) 0.2 Depth: (cm) 0.4 Volume: (cm) 0.063 Character of Wound/Ulcer Post Debridement: Improved Severity of Tissue Post Debridement: Fat layer exposed Post Procedure Diagnosis Same as Pre-procedure Electronic Signature(s) Signed: 02/03/2022 5:36:50 PM By: Shawn Stall RN, BSN Signed: 02/04/2022 6:42:44 PM By: Duanne Guess MD FACS Entered By: Shawn Stall on 02/03/2022 13:07:01 -------------------------------------------------------------------------------- HPI Details Patient Name: Date of Service: [name], [name] 02/03/2022 12:30 PM Medical Record Number: [id number] Patient Account Number: [id number] Date of Birth/Sex: Treating RN: 04-15-1971 (51 y.o. M) Primary Care Provider: Lance Bosch Other Clinician: Referring Provider: Treating Provider/Extender: Polly Cobia in Treatment: 108 History of Present Illness HPI Description: 04/21/18 ADMISSION This is a 51 year old man who is a type II diabetic. In spite of fact his hemoglobin A1c is actually quite good 5.73 months ago he is at a really difficult time over the last 6-7 months. He developed a rapidly progressive infection in the right foot in November 2018 associated with osteomyelitis and necrotizing fasciitis. 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 The stump required and a revision on 11/24/17. The stump revision was advertised as being secondary to falls. I'm not sure the progressive history here however this area is actually closed over. The patient tells me over the same timeframe he has had wounds on the plantar aspect of his left foot. In the ED saw Dr. Lajoyce Corners in April of this year. 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 It is really not clear that this patient is been dressing this with anything. He came in with the clinic without any specific dressing on the wound areas using his own tennis shoe. He tells me that he only ambulates of course to do a pivot transfer. He does not have his prosthesis  BLAYTON, HUTTNER ([id number]) Visit Report for 02/03/2022 Chief Complaint Document Details Patient Name: Date of Service: Harriet Pho 02/03/2022 12:30 PM Medical Record Number: [id number] Patient Account Number: [id number] Date of Birth/Sex: Treating RN: 14-Oct-1971 (51 y.o. M) Primary Care Provider: Lance Bosch Other Clinician: Referring Provider: Treating Provider/Extender: Polly Cobia in Treatment: 29 Information Obtained from: Patient Chief Complaint patient is here for a review of the wound on his plantar left first metatarsal head and back of his right leg Electronic Signature(s) Signed: 02/03/2022 5:59:20 PM By: Duanne Guess MD FACS Entered By: Duanne Guess on 02/03/2022 17:59:20 -------------------------------------------------------------------------------- Debridement Details Patient Name: Date of Service: Bertram Millard ND, [name] 02/03/2022 12:30 PM Medical Record Number: [id number] Patient Account Number: [id number] Date of Birth/Sex: Treating RN: 23-May-1971 (51 y.o. Harlon Flor, Millard.Loa Primary Care Provider: Lance Bosch Other Clinician: Referring Provider: Treating Provider/Extender: Polly Cobia in Treatment: 76 Debridement Performed for Assessment: Wound #2R Left Metatarsal head first Performed By: Physician Duanne Guess, MD Debridement Type: Debridement Severity of Tissue Pre Debridement: Fat layer exposed Level of Consciousness (Pre-procedure): Awake and Alert Pre-procedure Verification/Time Out Yes - 12:58 Taken: Start Time: 12:59 Pain Control: Lidocaine 5% topical ointment T Area Debrided (L x W): otal 2 (cm) x 2 (cm) = 4 (cm) Tissue and other material debrided: Non-Viable, Callus, Skin: Dermis , Skin: Epidermis Level: Skin/Epidermis Debridement Description: Selective/Open Wound Instrument: Curette Bleeding: Minimum Hemostasis Achieved: Silver Nitrate End Time:  13:03 Procedural Pain: 0 Post Procedural Pain: 0 Response to Treatment: Procedure was tolerated well Level of Consciousness (Post- Awake and Alert procedure): Post Debridement Measurements of Total Wound Length: (cm) 1 Width: (cm) 0.2 Depth: (cm) 0.4 Volume: (cm) 0.063 Character of Wound/Ulcer Post Debridement: Improved Severity of Tissue Post Debridement: Fat layer exposed Post Procedure Diagnosis Same as Pre-procedure Electronic Signature(s) Signed: 02/03/2022 5:36:50 PM By: Shawn Stall RN, BSN Signed: 02/04/2022 6:42:44 PM By: Duanne Guess MD FACS Entered By: Shawn Stall on 02/03/2022 13:07:01 -------------------------------------------------------------------------------- HPI Details Patient Name: Date of Service: [name], [name] 02/03/2022 12:30 PM Medical Record Number: [id number] Patient Account Number: [id number] Date of Birth/Sex: Treating RN: 04-15-1971 (51 y.o. M) Primary Care Provider: Lance Bosch Other Clinician: Referring Provider: Treating Provider/Extender: Polly Cobia in Treatment: 108 History of Present Illness HPI Description: 04/21/18 ADMISSION This is a 51 year old man who is a type II diabetic. In spite of fact his hemoglobin A1c is actually quite good 5.73 months ago he is at a really difficult time over the last 6-7 months. He developed a rapidly progressive infection in the right foot in November 2018 associated with osteomyelitis and necrotizing fasciitis. 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 The stump required and a revision on 11/24/17. The stump revision was advertised as being secondary to falls. I'm not sure the progressive history here however this area is actually closed over. The patient tells me over the same timeframe he has had wounds on the plantar aspect of his left foot. In the ED saw Dr. Lajoyce Corners in April of this year. 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 It is really not clear that this patient is been dressing this with anything. He came in with the clinic without any specific dressing on the wound areas using his own tennis shoe. He tells me that he only ambulates of course to do a pivot transfer. He does not have his prosthesis

## 2022-02-04 NOTE — Progress Notes (Signed)
[name] [name] in Treatment: 76 Active Problems Location of Pain Severity and Description of Pain Patient Has Paino No Site Locations Rate the pain. Current Pain Level: 0 Pain Management and Medication Current  Pain Management: Medication: No Cold Application: No Rest: No Massage: No Activity: No T.E.N.S.: No Heat Application: No Leg drop or elevation: No Is the Current Pain Management Adequate: Adequate How does your wound impact your activities of daily livingo Sleep: No Bathing: No Appetite: No Relationship With Others: No Bladder Continence: No Emotions: No Bowel Continence: No Work: No Toileting: No Drive: No Dressing: No Hobbies: No Psychologist, prison and probation services) Signed: 02/03/2022 5:36:50 PM By: Shawn Stall RN, BSN Entered By: Shawn Stall on 02/03/2022 12:43:49 -------------------------------------------------------------------------------- Patient/Caregiver Education Details Patient Name: Date of Service: [name] [name] 3/14/2023andnbsp12:30 PM Medical Record Number: [id number] Patient Account Number: [id number] Date of Birth/Gender: Treating RN: 1971/11/14 (51 y.o. [name] [name] Primary Care Physician: Lance Bosch Other Clinician: Referring Physician: Treating Physician/Extender: Polly Cobia in Treatment: 69 Education Assessment Education Provided To: Patient Education Topics Provided Wound/Skin Impairment: Handouts: Skin Care Do's and Dont's Methods: Explain/Verbal Responses: Reinforcements needed Electronic Signature(s) Signed: 02/03/2022 5:36:50 PM By: Shawn Stall RN, BSN Entered By: Shawn Stall on 02/03/2022 12:48:50 -------------------------------------------------------------------------------- Wound Assessment Details Patient Name: Date of Service: [name] [name] ND, [name] 02/03/2022 12:30 PM Medical Record Number: [id number] Patient Account Number: [id number] Date of Birth/Sex: Treating RN: 13-May-1971 (52 y.o. Harlon Flor, [name].Loa Primary Care Jandi Swiger: Lance Bosch Other Clinician: Referring Rhyder Bratz: Treating Jobie Popp/Extender: Polly Cobia in Treatment: 76 Wound Status Wound  Number: 2R Primary Diabetic Wound/Ulcer of the Lower Extremity Etiology: Wound Location: Left Metatarsal head first Wound Status: Open Wounding Event: Blister Comorbid Anemia, Hypertension, Peripheral Venous Disease, Type II Date Acquired: 06/23/2020 History: Diabetes Weeks Of Treatment: 76 Clustered Wound: No Photos Wound Measurements Length: (cm) 1 Width: (cm) 0.2 Depth: (cm) 0.4 Area: (cm) 0.157 Volume: (cm) 0.063 % Reduction in Area: -406.5% % Reduction in Volume: -2000% Epithelialization: None Undermining: Yes Starting Position (o'clock): 2 Ending Position (o'clock): 6 Maximum Distance: (cm) 0.6 Wound Description Classification: Grade 2 Wound Margin: Distinct, outline attached Exudate Amount: Small Exudate Type: Serosanguineous Exudate Color: red, brown Foul Odor After Cleansing: No Slough/Fibrino No Wound Bed Granulation Amount: None Present (0%) Exposed Structure Necrotic Amount: None Present (0%) Fascia Exposed: No Fat Layer (Subcutaneous Tissue) Exposed: Yes Tendon Exposed: No Muscle Exposed: No Joint Exposed: No Bone Exposed: No Assessment Notes callous wound. Treatment Notes Wound #2R (Metatarsal head first) Wound Laterality: Left Cleanser Soap and Water Discharge Instruction: May shower and wash wound with dial antibacterial soap and water prior to dressing change. Wound Cleanser Discharge Instruction: Cleanse the wound with wound cleanser prior to applying a clean dressing using gauze sponges, not tissue or cotton balls. Peri-Wound Care 40% Urea Cream Discharge Instruction: Apply to callous around wound daily Topical Gentamicin Discharge Instruction: apply directly to wound. apply Q-tip to apply directly into wound bed. Primary Dressing Secondary Dressing Woven Gauze Sponges 2x2 in Discharge Instruction: Apply over primary dressing as directed. Optifoam Non-Adhesive Dressing, 4x4 in Discharge Instruction: cut to make foam donut to help  offload Secured With [employer] Sterile, 4.5x3.1 (in/yd) Discharge Instruction: Secure with Kerlix as directed. 40M Medipore H Soft Cloth Surgical T ape, 2x2 (in/yd) Discharge Instruction: Secure dressing with tape as directed. Compression Wrap Compression Stockings Add-Ons Electronic Signature(s) Signed: 02/03/2022 5:36:50 PM By: Shawn Stall RN, BSN Signed: 02/04/2022 2:02:22 PM By: Karl Ito Entered By:  Karl Ito on 02/03/2022 12:48:01 -------------------------------------------------------------------------------- Vitals Details Patient Name: Date of Service: [name] [name] 02/03/2022 12:30 PM Medical Record Number: [id number] Patient Account Number: [id number] Date of Birth/Sex: Treating RN: Jul 20, 1971 (51 y.o. [name] [name] Primary Care Brenee Gajda: Lance Bosch Other Clinician: Referring Atlantis Delong: Treating Miko Markwood/Extender: Polly Cobia in Treatment: 67 Vital Signs Time Taken: 12:42 Temperature (F): 98.3 Height (in): 77 Pulse (bpm): 92 Weight (lbs): 232 Respiratory Rate (breaths/min): 16 Body Mass Index (BMI): 27.5 Blood Pressure (mmHg): 131/74 Capillary Blood Glucose (mg/dl): 409 Reference Range: 80 - 120 mg / dl Electronic Signature(s) Signed: 02/03/2022 5:36:50 PM By: Shawn Stall RN, BSN Entered By: Shawn Stall on 02/03/2022 12:44:42  Karl Ito on 02/03/2022 12:48:01 -------------------------------------------------------------------------------- Vitals Details Patient Name: Date of Service: [name] [name] 02/03/2022 12:30 PM Medical Record Number: [id number] Patient Account Number: [id number] Date of Birth/Sex: Treating RN: Jul 20, 1971 (51 y.o. [name] [name] Primary Care Brenee Gajda: Lance Bosch Other Clinician: Referring Atlantis Delong: Treating Miko Markwood/Extender: Polly Cobia in Treatment: 67 Vital Signs Time Taken: 12:42 Temperature (F): 98.3 Height (in): 77 Pulse (bpm): 92 Weight (lbs): 232 Respiratory Rate (breaths/min): 16 Body Mass Index (BMI): 27.5 Blood Pressure (mmHg): 131/74 Capillary Blood Glucose (mg/dl): 409 Reference Range: 80 - 120 mg / dl Electronic Signature(s) Signed: 02/03/2022 5:36:50 PM By: Shawn Stall RN, BSN Entered By: Shawn Stall on 02/03/2022 12:44:42  FURKAN, KEENUM ([id number]) Visit Report for 02/03/2022 Arrival Information Details Patient Name: Date of Service: [name] [name] 02/03/2022 12:30 PM Medical Record Number: [id number] Patient Account Number: [id number] Date of Birth/Sex: Treating RN: June 06, 1971 (51 y.o. Harlon Flor, [name].Loa Primary Care Debbra Digiulio: Lance Bosch Other Clinician: Referring Arcenia Scarbro: Treating Bennett Ram/Extender: Polly Cobia in Treatment: 60 Visit Information History Since Last Visit Added or deleted any medications: No Patient Arrived: Ambulatory Any new allergies or adverse reactions: No Arrival Time: 12:42 Had a fall or experienced change in No Accompanied By: self activities of daily living that may affect Transfer Assistance: None risk of falls: Patient Identification Verified: Yes Signs or symptoms of abuse/neglect since last visito No Secondary Verification Process Completed: Yes Hospitalized since last visit: No Patient Requires Transmission-Based Precautions: No Implantable device outside of the clinic excluding No Patient Has Alerts: No cellular tissue based products placed in the center since last visit: Has Dressing in Place as Prescribed: No Pain Present Now: No Notes Per patient lasix from PCP is helping with fluid overload. no dressing noted to wound/foot when sock and shoe removed. Electronic Signature(s) Signed: 02/03/2022 5:36:50 PM By: Shawn Stall RN, BSN Entered By: Shawn Stall on 02/03/2022 12:48:21 -------------------------------------------------------------------------------- Encounter Discharge Information Details Patient Name: Date of Service: [name], [name] 02/03/2022 12:30 PM Medical Record Number: [id number] Patient Account Number: [id number] Date of Birth/Sex: Treating RN: 1971/08/07 (50 y.o. [name] [name] Primary Care Datra Clary: Lance Bosch Other Clinician: Referring Gracielynn Birkel: Treating Donielle Radziewicz/Extender: Polly Cobia in Treatment: 102 Encounter Discharge Information Items Post Procedure Vitals Discharge Condition: Stable Temperature (F): 98.3 Ambulatory Status: Ambulatory Pulse (bpm): 92 Discharge Destination: Home Respiratory Rate (breaths/min): 20 Transportation: Private Auto Blood Pressure (mmHg): 131/74 Accompanied By: self Schedule Follow-up Appointment: Yes Clinical Summary of Care: Electronic Signature(s) Signed: 02/03/2022 5:36:50 PM By: Shawn Stall RN, BSN Entered By: Shawn Stall on 02/03/2022 13:09:28 -------------------------------------------------------------------------------- Lower Extremity Assessment Details Patient Name: Date of Service: [name], [name] 02/03/2022 12:30 PM Medical Record Number: [id number] Patient Account Number: [id number] Date of Birth/Sex: Treating RN: 1970/12/31 (51 y.o. [name] [name] Primary Care Bella Brummet: Lance Bosch Other Clinician: Referring Cristle Jared: Treating Jivan Symanski/Extender: Polly Cobia in Treatment: 76 Edema Assessment Assessed: Kyra Searles: Yes] Franne Forts: No] Edema: [Left: Ye] [Right: s] Calf Left: Right: Point of Measurement: 50 cm From Medial Instep 41 cm Ankle Left: Right: Point of Measurement: 11 cm From Medial Instep 25 cm Vascular Assessment Pulses: Dorsalis Pedis Palpable: [Left:Yes] Electronic Signature(s) Signed: 02/03/2022 5:36:50 PM By: Shawn Stall RN, BSN Entered By: Shawn Stall on 02/03/2022 12:45:05 -------------------------------------------------------------------------------- Multi Wound Chart Details Patient Name: Date of Service: [name] [name] ND, [name] 02/03/2022 12:30 PM Medical Record Number: [id number] Patient Account Number: [id number] Date of Birth/Sex: Treating RN: 1971-08-12 (51 y.o. M) Primary Care Kealani Leckey: Lance Bosch Other Clinician: Referring Avabella Wailes: Treating Azriella Mattia/Extender: Polly Cobia in  Treatment: 75 Vital Signs Height(in): 77 Capillary Blood Glucose(mg/dl): 244 Weight(lbs): 010 Pulse(bpm): 92 Body Mass Index(BMI): 27.5 Blood Pressure(mmHg): 131/74 Temperature(F): 98.3 Respiratory Rate(breaths/min): 16 Photos: [2R:Left Metatarsal head first] [N/A:N/A N/A] Wound Location: [2R:Blister] [N/A:N/A] Wounding Event: [2R:Diabetic Wound/Ulcer of the Lower] [N/A:N/A] Primary Etiology: [2R:Extremity Anemia, Hypertension, Peripheral] [N/A:N/A] Comorbid History: [2R:Venous Disease, Type II Diabetes 06/23/2020] [N/A:N/A] Date Acquired: [2R:76] [N/A:N/A] Weeks of Treatment: [2R:Open] [N/A:N/A] Wound Status: [2R:Yes] [N/A:N/A] Wound Recurrence: [2R:1x0.2x0.4] [N/A:N/A] Measurements L x W x D (cm) [2R:0.157] [N/A:N/A] A (cm) : rea [2R:0.063] [N/A:N/A] Volume (cm) : [2R:-406.50%] [

## 2022-02-05 ENCOUNTER — Other Ambulatory Visit (HOSPITAL_COMMUNITY): Payer: Self-pay | Admitting: *Deleted

## 2022-02-06 ENCOUNTER — Other Ambulatory Visit: Payer: Self-pay

## 2022-02-06 ENCOUNTER — Ambulatory Visit (HOSPITAL_COMMUNITY)
Admission: RE | Admit: 2022-02-06 | Discharge: 2022-02-06 | Disposition: A | Discharge status: Home / Self Care | Payer: Medicare Other | Source: Ambulatory Visit | Attending: Nephrology | Admitting: Nephrology

## 2022-02-06 VITALS — BP 123/75 | HR 76 | Temp 97.1°F | Resp 20

## 2022-02-06 DIAGNOSIS — D649 Anemia, unspecified: Secondary | ICD-10-CM | POA: Diagnosis present

## 2022-02-06 DIAGNOSIS — N179 Acute kidney failure, unspecified: Secondary | ICD-10-CM | POA: Insufficient documentation

## 2022-02-06 DIAGNOSIS — D62 Acute posthemorrhagic anemia: Secondary | ICD-10-CM | POA: Diagnosis present

## 2022-02-06 LAB — IRON AND TIBC
Iron: 46 ug/dL (ref 45–182)
Saturation Ratios: 16 % — ABNORMAL LOW (ref 17.9–39.5)
TIBC: 294 ug/dL (ref 250–450)
UIBC: 248 ug/dL

## 2022-02-06 LAB — FERRITIN: Ferritin: 31 ng/mL (ref 24–336)

## 2022-02-06 LAB — POCT HEMOGLOBIN-HEMACUE: Hemoglobin: 9.5 g/dL — ABNORMAL LOW (ref 13.0–17.0)

## 2022-02-06 MED ORDER — EPOETIN ALFA-EPBX 10000 UNIT/ML IJ SOLN
INTRAMUSCULAR | Status: AC
  Administered 2022-02-06: 10000 [IU] via SUBCUTANEOUS
  Filled 2022-02-06: qty 1

## 2022-02-06 MED ORDER — EPOETIN ALFA-EPBX 10000 UNIT/ML IJ SOLN: 10000.0000 [IU] | INTRAMUSCULAR | Status: DC

## 2022-02-10 ENCOUNTER — Other Ambulatory Visit: Payer: Self-pay

## 2022-02-10 ENCOUNTER — Encounter (HOSPITAL_BASED_OUTPATIENT_CLINIC_OR_DEPARTMENT_OTHER): Payer: Medicare Other | Admitting: Internal Medicine

## 2022-02-10 DIAGNOSIS — L97522 Non-pressure chronic ulcer of other part of left foot with fat layer exposed: Secondary | ICD-10-CM

## 2022-02-10 DIAGNOSIS — E1142 Type 2 diabetes mellitus with diabetic polyneuropathy: Secondary | ICD-10-CM

## 2022-02-10 DIAGNOSIS — Z89511 Acquired absence of right leg below knee: Secondary | ICD-10-CM

## 2022-02-10 DIAGNOSIS — E11621 Type 2 diabetes mellitus with foot ulcer: Secondary | ICD-10-CM | POA: Diagnosis not present

## 2022-02-10 NOTE — Progress Notes (Addendum)
Laterality: Left Cleanser: Soap and Water 1 x Per Day/30 Days Discharge Instructions: May shower and wash wound with dial antibacterial soap and water prior to dressing change. Cleanser: Wound Cleanser (Generic) 1 x Per Day/30 Days Discharge Instructions: Cleanse the wound with wound cleanser prior to applying a clean dressing using gauze sponges, not tissue or cotton balls. Peri-Wound Care: 40% Urea Cream 1 x Per Day/30 Days Discharge Instructions: Apply to callous around wound daily Topical: Gentamicin 1 x Per Day/30 Days Discharge Instructions: apply directly to wound. apply Q-tip to apply directly into wound bed. In clinic will apply gentamicin ointment to 2x2 gauze and lightly pack into wound bed. Secondary Dressing: Woven Gauze Sponges 2x2 in (Generic) 1 x Per Day/30 Days Discharge Instructions: Apply over primary dressing as directed. Secondary Dressing: Optifoam Non-Adhesive Dressing, 4x4 in (Generic) 1 x Per Day/30 Days Discharge Instructions: cut to make foam donut  to help offload Secured With: Kerlix Roll Sterile, 4.5x3.1 (in/yd) (Generic) 1 x Per Day/30 Days Discharge Instructions: Secure with Kerlix as directed. Secured With: 25M Medipore H Soft Cloth Surgical T ape, 2x2 (in/yd) (Generic) 1 x Per Day/30 Days Discharge Instructions: Secure dressing with tape as directed. 1. Gentamicin ointment 2. Aggressive offloadingooPegasys insert 3. Follow-up in 1 week Electronic Signature(s) Signed: 02/10/2022 3:43:03 PM By: Geralyn Corwin DO Entered By: Geralyn Corwin on 02/10/2022 15:42:20 -------------------------------------------------------------------------------- HxROS Details Patient Name: Date of Service: [name], [name] 02/10/2022 2:15 PM Medical Record Number: [id number] Patient Account Number: [id number] Date of Birth/Sex: Treating RN: [birth year] (51 y.o. M) Primary Care Provider: Lance Bosch Other Clinician: Referring Provider: Treating Provider/Extender: Carlyle Lipa in Treatment: 16 Information Obtained From Patient Constitutional Symptoms (General Health) Medical History: Past Medical History Notes: falls , leukocytosis Eyes Medical History: Negative for: Cataracts; Glaucoma; Optic Neuritis Ear/Nose/Mouth/Throat Medical History: Negative for: Chronic sinus problems/congestion; Middle ear problems Hematologic/Lymphatic Medical History: Positive for: Anemia Negative for: Hemophilia; Human Immunodeficiency Virus; Lymphedema; Sickle Cell Disease Respiratory Medical History: Negative for: Aspiration; Asthma; Chronic Obstructive Pulmonary Disease (COPD); Pneumothorax; Sleep Apnea; Tuberculosis Past Medical History Notes: During waking hours and catches himself not breathing, "gasp for air". Saw Dr Donnie Aho, he couldn't find a reason Cardiovascular Medical History: Positive for: Hypertension; Peripheral Venous Disease Negative for: Angina; Arrhythmia; Congestive Heart Failure; Coronary Artery  Disease; Deep Vein Thrombosis; Hypotension; Myocardial Infarction; Peripheral Arterial Disease; Phlebitis; Vasculitis Gastrointestinal Medical History: Negative for: Cirrhosis ; Colitis; Crohns; Hepatitis A; Hepatitis B; Hepatitis C Endocrine Medical History: Positive for: Type II Diabetes - oral meds Negative for: Type I Diabetes Time with diabetes: 2014 Treated with: Oral agents Blood sugar tested every day: Yes Tested : Blood sugar testing results: Breakfast: 91 Genitourinary Medical History: Negative for: End Stage Renal Disease Immunological Medical History: Negative for: Lupus Erythematosus; Raynauds; Scleroderma Integumentary (Skin) Medical History: Negative for: History of Burn Musculoskeletal Medical History: Negative for: Gout; Rheumatoid Arthritis; Osteoarthritis; Osteomyelitis Neurologic Medical History: Negative for: Dementia; Neuropathy; Quadriplegia; Paraplegia; Seizure Disorder Oncologic Medical History: Negative for: Received Chemotherapy; Received Radiation Psychiatric Medical History: Negative for: Anorexia/bulimia; Confinement Anxiety Immunizations Pneumococcal Vaccine: Received Pneumococcal Vaccination: No Implantable Devices No devices added Hospitalization / Surgery History Type of Hospitalization/Surgery fever 105, sepsis cellulitis right popliteal fossa 04/09/2021 Family and Social History Cancer: No; Diabetes: Yes - Maternal Grandparents,Mother; Heart Disease: Yes - Father; Hereditary Spherocytosis: No; Hypertension: Yes - Father; Kidney Disease: No; Lung Disease: No; Seizures: No; Stroke: No; Thyroid Problems: No; Tuberculosis: No; Current every day smoker; Marital Status - Single; Alcohol  for patient.. Cardiovascular 2+ dorsalis pedis/posterior tibialis pulses. Psychiatric pleasant and cooperative. Notes Left foot: First met head there is a slitlike wound with circumferential callus. There is granulation tissue at the opening. No signs of surrounding infection. Electronic Signature(s) Signed: 02/10/2022 3:43:03 PM By: Geralyn Corwin DO Entered By: Geralyn Corwin on 02/10/2022 15:37:55 -------------------------------------------------------------------------------- Physician Orders Details Patient Name: Date of Service: Bertram Millard ND, [name] 02/10/2022 2:15 PM Medical Record Number: [id number] Patient Account Number: [id number] Date of Birth/Sex: Treating RN: [birth year] (51 y.o. [name] Primary Care Provider: Lance Bosch Other Clinician: Referring Provider: Treating Provider/Extender: Carlyle Lipa in Treatment: 29 Verbal / Phone Orders: No Diagnosis Coding ICD-10 Coding Code Description [phone] Non-pressure chronic ulcer of other part of left foot with fat layer exposed E11.621 Type 2 diabetes mellitus with foot ulcer E11.42 Type 2 diabetes mellitus with diabetic polyneuropathy Z89.511 Acquired absence of right leg below knee L02.415 Cutaneous abscess of right lower limb Follow-up Appointments ppointment in 1 week. - with Dr. Mikey Bussing Room 8, [facility] Tuesday at 11am. Return A Other: - Ensure to offload and dressing changes as it is important to aid in wound healing. Edema Control - Lymphedema / SCD / Other Elevate legs to the level of the heart or above for 30 minutes daily and/or when sitting, a frequency of: - throughout the day Avoid standing for long periods of  time. Off-Loading Other: - minimize walking on left foot related to pressure to wound. Aid in wound healing. Wound Treatment Wound #2R - Metatarsal head first Wound Laterality: Left Cleanser: Soap and Water 1 x Per Day/30 Days Discharge Instructions: May shower and wash wound with dial antibacterial soap and water prior to dressing change. Cleanser: Wound Cleanser (Generic) 1 x Per Day/30 Days Discharge Instructions: Cleanse the wound with wound cleanser prior to applying a clean dressing using gauze sponges, not tissue or cotton balls. Peri-Wound Care: 40% Urea Cream 1 x Per Day/30 Days Discharge Instructions: Apply to callous around wound daily Topical: Gentamicin 1 x Per Day/30 Days Discharge Instructions: apply directly to wound. apply Q-tip to apply directly into wound bed. In clinic will apply gentamicin ointment to 2x2 gauze and lightly pack into wound bed. Secondary Dressing: Woven Gauze Sponges 2x2 in (Generic) 1 x Per Day/30 Days Discharge Instructions: Apply over primary dressing as directed. Secondary Dressing: Optifoam Non-Adhesive Dressing, 4x4 in (Generic) 1 x Per Day/30 Days Discharge Instructions: cut to make foam donut to help offload Secured With: Kerlix Roll Sterile, 4.5x3.1 (in/yd) (Generic) 1 x Per Day/30 Days Discharge Instructions: Secure with Kerlix as directed. Secured With: 105M Medipore H Soft Cloth Surgical Tape, 2x2 (in/yd) (Generic) 1 x Per Day/30 Days Discharge Instructions: Secure dressing with tape as directed. Electronic Signature(s) Signed: 02/10/2022 3:43:03 PM By: Geralyn Corwin DO Entered By: Geralyn Corwin on 02/10/2022 15:39:06 -------------------------------------------------------------------------------- Problem List Details Patient Name: Date of Service: Bertram Millard ND, [name] 02/10/2022 2:15 PM Medical Record Number: [id number] Patient Account Number: [id number] Date of Birth/Sex: Treating RN: 1971/08/27 (51 y.o. [name] Primary  Care Provider: Lance Bosch Other Clinician: Referring Provider: Treating Provider/Extender: Carlyle Lipa in Treatment: 35 Active Problems ICD-10 Encounter Code Description Active Date MDM Diagnosis [phone] Non-pressure chronic ulcer of other part of left foot with fat layer exposed 08/16/2020 No Yes E11.621 Type 2 diabetes mellitus with foot ulcer 08/16/2020 No Yes E11.42 Type 2 diabetes mellitus with diabetic polyneuropathy 08/16/2020 No Yes Z89.511 Acquired absence of right leg below knee 08/16/2020  complaints today. 11/3; patient presents for follow-up. He has no issues or complaints today. He states he does not want to follow-up with orthopedics to reevaluate the abscess in the back of his right leg. He denies signs of infection. 11/15; patient presents for follow-up. He has no issues or complaints today. He had been using silver alginate to the left first met head wound. He denies signs of infection. 11/22; patient presents for follow-up. He had Affinity placed at last clinic visit. He has no issues or complaints today. 11/29; patient presents for follow-up. He is in good spirits today. He has no issues or complaints today. 12/6; patient presents for follow-up. He has no issues or complaints today. 12/13; patient presents for follow-up. He denies signs of infection. He has no issues or complaints today. 12/20; patient presents for follow-up. He has no issues or complaints today. He denies signs of infection. 12/29; patient presents for follow-up. He states that a few days ago he has had reaccumulation of an abscess behind the right knee. He denies increased warmth or erythema to the area. He denies fever/chills or drainage. 1/6; patient presents for follow-up. He states he went to the [facility] emergency department to be evaluated for the abscess to his right posterior knee. He was started on clindamycin. They were unable to drain the abscess. Patient states that eventually the abscess ruptured. He reports improvement in his symptoms. He has no issues or complaints about his left foot wound. 1/17; patient presents for follow-up. He has no issues or complaints  today. 1/24; patient presents for follow-up. He has no issues or complaints today. He denies signs of infection. 1/31; patient presents for follow-up. He has been approved for Affinity however has a 20% co-pay. Patient has declined further Affinity treatments due to this. He currently denies signs of infection. 2/7; patient presents for follow-up. He has no issues or complaints today. He denies signs of infection. He has been doing Hydrofera Blue dressing changes. 2/14; patient presents for follow-up. He has no issues or complaints today. He has been using blast X with collagen to the wound bed. He currently denies any issues. 2/28; patient presents for follow-up. He denies signs of infection. He reports using blast X with collagen to the wound bed. 3/7; patient presents for follow-up. He states he has been using gentamicin ointment and urea cream with dressing changes. He reports that the back of his right knee has developed an abscess again and is draining. He states this started today. 02/03/2022: The patient came to clinic today without any dressing on his foot. He says that he took a shower yesterday and felt like he did not need a dressing and so he did not apply it. He is not offloading this foot and is wearing a regular athletic shoe. 3/21; patient presents for follow-up. He did not come in with a dressing on his wound. He states he has been using gentamicin ointment daily. He has a Pegasys insert to his tennis shoe for offloading. He currently denies signs of infection. Electronic Signature(s) Signed: 02/10/2022 3:43:03 PM By: Geralyn Corwin DO Entered By: Geralyn Corwin on 02/10/2022 15:36:51 -------------------------------------------------------------------------------- Physical Exam Details Patient Name: Date of Service: [name], [name] 02/10/2022 2:15 PM Medical Record Number: [id number] Patient Account Number: [id number] Date of Birth/Sex: Treating RN: Mar 31, 1971 (51 y.o.  M) Primary Care Provider: Lance Bosch Other Clinician: Referring Provider: Treating Provider/Extender: Carlyle Lipa in Treatment: 35 Constitutional respirations regular, non-labored and within target range  Laterality: Left Cleanser: Soap and Water 1 x Per Day/30 Days Discharge Instructions: May shower and wash wound with dial antibacterial soap and water prior to dressing change. Cleanser: Wound Cleanser (Generic) 1 x Per Day/30 Days Discharge Instructions: Cleanse the wound with wound cleanser prior to applying a clean dressing using gauze sponges, not tissue or cotton balls. Peri-Wound Care: 40% Urea Cream 1 x Per Day/30 Days Discharge Instructions: Apply to callous around wound daily Topical: Gentamicin 1 x Per Day/30 Days Discharge Instructions: apply directly to wound. apply Q-tip to apply directly into wound bed. In clinic will apply gentamicin ointment to 2x2 gauze and lightly pack into wound bed. Secondary Dressing: Woven Gauze Sponges 2x2 in (Generic) 1 x Per Day/30 Days Discharge Instructions: Apply over primary dressing as directed. Secondary Dressing: Optifoam Non-Adhesive Dressing, 4x4 in (Generic) 1 x Per Day/30 Days Discharge Instructions: cut to make foam donut  to help offload Secured With: Kerlix Roll Sterile, 4.5x3.1 (in/yd) (Generic) 1 x Per Day/30 Days Discharge Instructions: Secure with Kerlix as directed. Secured With: 25M Medipore H Soft Cloth Surgical T ape, 2x2 (in/yd) (Generic) 1 x Per Day/30 Days Discharge Instructions: Secure dressing with tape as directed. 1. Gentamicin ointment 2. Aggressive offloadingooPegasys insert 3. Follow-up in 1 week Electronic Signature(s) Signed: 02/10/2022 3:43:03 PM By: Geralyn Corwin DO Entered By: Geralyn Corwin on 02/10/2022 15:42:20 -------------------------------------------------------------------------------- HxROS Details Patient Name: Date of Service: [name], [name] 02/10/2022 2:15 PM Medical Record Number: [id number] Patient Account Number: [id number] Date of Birth/Sex: Treating RN: [birth year] (51 y.o. M) Primary Care Provider: Lance Bosch Other Clinician: Referring Provider: Treating Provider/Extender: Carlyle Lipa in Treatment: 16 Information Obtained From Patient Constitutional Symptoms (General Health) Medical History: Past Medical History Notes: falls , leukocytosis Eyes Medical History: Negative for: Cataracts; Glaucoma; Optic Neuritis Ear/Nose/Mouth/Throat Medical History: Negative for: Chronic sinus problems/congestion; Middle ear problems Hematologic/Lymphatic Medical History: Positive for: Anemia Negative for: Hemophilia; Human Immunodeficiency Virus; Lymphedema; Sickle Cell Disease Respiratory Medical History: Negative for: Aspiration; Asthma; Chronic Obstructive Pulmonary Disease (COPD); Pneumothorax; Sleep Apnea; Tuberculosis Past Medical History Notes: During waking hours and catches himself not breathing, "gasp for air". Saw Dr Donnie Aho, he couldn't find a reason Cardiovascular Medical History: Positive for: Hypertension; Peripheral Venous Disease Negative for: Angina; Arrhythmia; Congestive Heart Failure; Coronary Artery  Disease; Deep Vein Thrombosis; Hypotension; Myocardial Infarction; Peripheral Arterial Disease; Phlebitis; Vasculitis Gastrointestinal Medical History: Negative for: Cirrhosis ; Colitis; Crohns; Hepatitis A; Hepatitis B; Hepatitis C Endocrine Medical History: Positive for: Type II Diabetes - oral meds Negative for: Type I Diabetes Time with diabetes: 2014 Treated with: Oral agents Blood sugar tested every day: Yes Tested : Blood sugar testing results: Breakfast: 91 Genitourinary Medical History: Negative for: End Stage Renal Disease Immunological Medical History: Negative for: Lupus Erythematosus; Raynauds; Scleroderma Integumentary (Skin) Medical History: Negative for: History of Burn Musculoskeletal Medical History: Negative for: Gout; Rheumatoid Arthritis; Osteoarthritis; Osteomyelitis Neurologic Medical History: Negative for: Dementia; Neuropathy; Quadriplegia; Paraplegia; Seizure Disorder Oncologic Medical History: Negative for: Received Chemotherapy; Received Radiation Psychiatric Medical History: Negative for: Anorexia/bulimia; Confinement Anxiety Immunizations Pneumococcal Vaccine: Received Pneumococcal Vaccination: No Implantable Devices No devices added Hospitalization / Surgery History Type of Hospitalization/Surgery fever 105, sepsis cellulitis right popliteal fossa 04/09/2021 Family and Social History Cancer: No; Diabetes: Yes - Maternal Grandparents,Mother; Heart Disease: Yes - Father; Hereditary Spherocytosis: No; Hypertension: Yes - Father; Kidney Disease: No; Lung Disease: No; Seizures: No; Stroke: No; Thyroid Problems: No; Tuberculosis: No; Current every day smoker; Marital Status - Single; Alcohol  Laterality: Left Cleanser: Soap and Water 1 x Per Day/30 Days Discharge Instructions: May shower and wash wound with dial antibacterial soap and water prior to dressing change. Cleanser: Wound Cleanser (Generic) 1 x Per Day/30 Days Discharge Instructions: Cleanse the wound with wound cleanser prior to applying a clean dressing using gauze sponges, not tissue or cotton balls. Peri-Wound Care: 40% Urea Cream 1 x Per Day/30 Days Discharge Instructions: Apply to callous around wound daily Topical: Gentamicin 1 x Per Day/30 Days Discharge Instructions: apply directly to wound. apply Q-tip to apply directly into wound bed. In clinic will apply gentamicin ointment to 2x2 gauze and lightly pack into wound bed. Secondary Dressing: Woven Gauze Sponges 2x2 in (Generic) 1 x Per Day/30 Days Discharge Instructions: Apply over primary dressing as directed. Secondary Dressing: Optifoam Non-Adhesive Dressing, 4x4 in (Generic) 1 x Per Day/30 Days Discharge Instructions: cut to make foam donut  to help offload Secured With: Kerlix Roll Sterile, 4.5x3.1 (in/yd) (Generic) 1 x Per Day/30 Days Discharge Instructions: Secure with Kerlix as directed. Secured With: 25M Medipore H Soft Cloth Surgical T ape, 2x2 (in/yd) (Generic) 1 x Per Day/30 Days Discharge Instructions: Secure dressing with tape as directed. 1. Gentamicin ointment 2. Aggressive offloadingooPegasys insert 3. Follow-up in 1 week Electronic Signature(s) Signed: 02/10/2022 3:43:03 PM By: Geralyn Corwin DO Entered By: Geralyn Corwin on 02/10/2022 15:42:20 -------------------------------------------------------------------------------- HxROS Details Patient Name: Date of Service: [name], [name] 02/10/2022 2:15 PM Medical Record Number: [id number] Patient Account Number: [id number] Date of Birth/Sex: Treating RN: [birth year] (51 y.o. M) Primary Care Provider: Lance Bosch Other Clinician: Referring Provider: Treating Provider/Extender: Carlyle Lipa in Treatment: 16 Information Obtained From Patient Constitutional Symptoms (General Health) Medical History: Past Medical History Notes: falls , leukocytosis Eyes Medical History: Negative for: Cataracts; Glaucoma; Optic Neuritis Ear/Nose/Mouth/Throat Medical History: Negative for: Chronic sinus problems/congestion; Middle ear problems Hematologic/Lymphatic Medical History: Positive for: Anemia Negative for: Hemophilia; Human Immunodeficiency Virus; Lymphedema; Sickle Cell Disease Respiratory Medical History: Negative for: Aspiration; Asthma; Chronic Obstructive Pulmonary Disease (COPD); Pneumothorax; Sleep Apnea; Tuberculosis Past Medical History Notes: During waking hours and catches himself not breathing, "gasp for air". Saw Dr Donnie Aho, he couldn't find a reason Cardiovascular Medical History: Positive for: Hypertension; Peripheral Venous Disease Negative for: Angina; Arrhythmia; Congestive Heart Failure; Coronary Artery  Disease; Deep Vein Thrombosis; Hypotension; Myocardial Infarction; Peripheral Arterial Disease; Phlebitis; Vasculitis Gastrointestinal Medical History: Negative for: Cirrhosis ; Colitis; Crohns; Hepatitis A; Hepatitis B; Hepatitis C Endocrine Medical History: Positive for: Type II Diabetes - oral meds Negative for: Type I Diabetes Time with diabetes: 2014 Treated with: Oral agents Blood sugar tested every day: Yes Tested : Blood sugar testing results: Breakfast: 91 Genitourinary Medical History: Negative for: End Stage Renal Disease Immunological Medical History: Negative for: Lupus Erythematosus; Raynauds; Scleroderma Integumentary (Skin) Medical History: Negative for: History of Burn Musculoskeletal Medical History: Negative for: Gout; Rheumatoid Arthritis; Osteoarthritis; Osteomyelitis Neurologic Medical History: Negative for: Dementia; Neuropathy; Quadriplegia; Paraplegia; Seizure Disorder Oncologic Medical History: Negative for: Received Chemotherapy; Received Radiation Psychiatric Medical History: Negative for: Anorexia/bulimia; Confinement Anxiety Immunizations Pneumococcal Vaccine: Received Pneumococcal Vaccination: No Implantable Devices No devices added Hospitalization / Surgery History Type of Hospitalization/Surgery fever 105, sepsis cellulitis right popliteal fossa 04/09/2021 Family and Social History Cancer: No; Diabetes: Yes - Maternal Grandparents,Mother; Heart Disease: Yes - Father; Hereditary Spherocytosis: No; Hypertension: Yes - Father; Kidney Disease: No; Lung Disease: No; Seizures: No; Stroke: No; Thyroid Problems: No; Tuberculosis: No; Current every day smoker; Marital Status - Single; Alcohol  BRIYAN, KLEVEN ([id number]) Visit Report for 02/10/2022 Chief Complaint Document Details Patient Name: Date of Service: Harriet Pho 02/10/2022 2:15 PM Medical Record Number: [id number] Patient Account Number: [id number] Date of Birth/Sex: Treating RN: [birth year] (51 y.o. M) Primary Care Provider: Lance Bosch Other Clinician: Referring Provider: Treating Provider/Extender: Carlyle Lipa in Treatment: 26 Information Obtained from: Patient Chief Complaint patient is here for a review of the wound on his plantar left first metatarsal head and back of his right leg Electronic Signature(s) Signed: 02/10/2022 3:43:03 PM By: Geralyn Corwin DO Entered By: Geralyn Corwin on 02/10/2022 15:35:55 -------------------------------------------------------------------------------- HPI Details Patient Name: Date of Service: [name], [name] 02/10/2022 2:15 PM Medical Record Number: [id number] Patient Account Number: [id number] Date of Birth/Sex: Treating RN: 02-Jun-1971 (51 y.o. M) Primary Care Provider: Lance Bosch Other Clinician: Referring Provider: Treating Provider/Extender: Carlyle Lipa in Treatment: 56 History of Present Illness HPI Description: 04/21/18 ADMISSION This is a 51 year old man who is a type II diabetic. In spite of fact his hemoglobin A1c is actually quite good 5.73 months ago he is at a really difficult time over the last 6-7 months. He developed a rapidly progressive infection in the right foot in November 2018 associated with osteomyelitis and necrotizing fasciitis. 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 The stump required and a revision on 11/24/17. The stump revision was advertised as being secondary to falls. I'm not sure the progressive history here however this area is actually closed over. The patient tells me over the same timeframe he has had wounds on the plantar aspect of his left foot. In the  ED saw Dr. Lajoyce Corners in April of this year. 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 It is really not clear that this patient is been dressing this with anything. He came in with the clinic without any specific dressing on the wound areas using his own tennis shoe. He tells me that he only ambulates of course to do a pivot transfer. He does not have his prosthesis for the right leg as of yet and he blames Medicaid for this. He does however use the foot to push himself along in his wheelchair at home. The patient has not had formal arterial studies. ABI in our clinic on the left was 1.13. Patient's past medical history includes type 2 diabetes, fracture of the right fibula. I see that he was treated for abscesses on his right buttock and chest in 2016. I did not look at the microbiology of this. 04/28/18; patient comes back in the clinic today with the wound pretty much the same as when he came in here last week. Small opening lots of undermining relatively. He tells [place] that nothing is really been on this for 3 days in spite of the fact that we gave him enough to dress this easily especially such a small wound. He says he lives with his mother, she is not capable of assisting with this he is changing the dressings himself. 05/05/18; much better-looking wound today. Smaller. There is some undermining medially however that's only perhaps 2 mm. No surrounding erythema. We've been using silver collagen 05/12/18; small wound on the first metatarsal head. No undermining no surrounding erythema. We've been using silver collagen he has home health coming out to change 05/20/18 the wound on the first metatarsal head looks better. Covered in surface debris/callus nonviable tissue. Required debridement but post debridement  No Yes L02.415 Cutaneous abscess of right lower limb 01/27/2022 No Yes Inactive Problems Resolved Problems ICD-10 Code Description Active Date Resolved Date S81.801D Unspecified open wound, right lower leg, subsequent encounter 04/08/2021 04/08/2021 Electronic Signature(s) Signed: 02/10/2022 3:43:03 PM By: Geralyn Corwin DO Entered By: Geralyn Corwin on 02/10/2022 15:35:40 -------------------------------------------------------------------------------- Progress Note Details Patient Name: Date of Service: Bertram Millard ND, [name] 02/10/2022 2:15 PM Medical Record Number: [id number] Patient Account Number: [id number] Date of Birth/Sex: Treating RN: 1971/04/23 (51 y.o. M) Primary Care Provider: Lance Bosch Other Clinician: Referring Provider: Treating Provider/Extender: Carlyle Lipa in Treatment: 85 Subjective Chief Complaint Information obtained from Patient patient is here for a review of the wound on his plantar left first metatarsal head and back of his right leg History of Present Illness (HPI) 04/21/18 ADMISSION This is a 51 year old man who is a type II diabetic. In spite of fact his hemoglobin A1c is actually quite good 5.73 months ago he is at a really difficult time over the last 6-7 months. He developed a rapidly progressive infection in the right foot in November 2018 associated with osteomyelitis and  necrotizing fasciitis. 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 The stump required and a revision on 11/24/17. The stump revision was advertised as being secondary to falls. I'm not sure the progressive history here however this area is actually closed over. The patient tells me over the same timeframe he has had wounds on the plantar aspect of his left foot. In the ED saw Dr. Lajoyce Corners in April of this year. 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 It is really not clear that this patient is been dressing this with anything. He came in with the clinic without any specific dressing on the wound areas using his own tennis shoe. He tells me that he only ambulates of course to do a pivot transfer. He does not have his prosthesis for the right leg as of yet and he blames Medicaid for this. He does however use the foot to push himself along in his wheelchair at home. The patient has not had formal arterial studies. ABI in our clinic on the left was 1.13. Patient's past medical history includes type 2 diabetes, fracture of the right fibula. I see that he was treated for abscesses on his right buttock and chest in 2016. I did not look at the microbiology of this. 04/28/18; patient comes back in the clinic today with the wound pretty much the same as when he came in here last week. Small opening lots of undermining relatively. He tells [place] that nothing is really been on this for 3 days in spite of the fact that we gave him enough to dress this easily especially such a small wound. He says he lives with his mother, she is not capable of assisting with this he is changing the dressings himself. 05/05/18; much better-looking wound today. Smaller. There is some undermining medially however that's only perhaps 2 mm. No surrounding erythema. We've been using silver collagen 05/12/18; small wound on the first metatarsal head. No undermining no surrounding erythema. We've been using  silver collagen he has home health coming out to change 05/20/18 the wound on the first metatarsal head looks better. Covered in surface debris/callus nonviable tissue. Required debridement but post debridement this looks quite good. We've been using silver collagen. He has home health 05/27/18; first metatarsal head wound continues to improve. Just about completely closed.  BRIYAN, KLEVEN ([id number]) Visit Report for 02/10/2022 Chief Complaint Document Details Patient Name: Date of Service: Harriet Pho 02/10/2022 2:15 PM Medical Record Number: [id number] Patient Account Number: [id number] Date of Birth/Sex: Treating RN: [birth year] (51 y.o. M) Primary Care Provider: Lance Bosch Other Clinician: Referring Provider: Treating Provider/Extender: Carlyle Lipa in Treatment: 26 Information Obtained from: Patient Chief Complaint patient is here for a review of the wound on his plantar left first metatarsal head and back of his right leg Electronic Signature(s) Signed: 02/10/2022 3:43:03 PM By: Geralyn Corwin DO Entered By: Geralyn Corwin on 02/10/2022 15:35:55 -------------------------------------------------------------------------------- HPI Details Patient Name: Date of Service: [name], [name] 02/10/2022 2:15 PM Medical Record Number: [id number] Patient Account Number: [id number] Date of Birth/Sex: Treating RN: 02-Jun-1971 (51 y.o. M) Primary Care Provider: Lance Bosch Other Clinician: Referring Provider: Treating Provider/Extender: Carlyle Lipa in Treatment: 56 History of Present Illness HPI Description: 04/21/18 ADMISSION This is a 51 year old man who is a type II diabetic. In spite of fact his hemoglobin A1c is actually quite good 5.73 months ago he is at a really difficult time over the last 6-7 months. He developed a rapidly progressive infection in the right foot in November 2018 associated with osteomyelitis and necrotizing fasciitis. 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 The stump required and a revision on 11/24/17. The stump revision was advertised as being secondary to falls. I'm not sure the progressive history here however this area is actually closed over. The patient tells me over the same timeframe he has had wounds on the plantar aspect of his left foot. In the  ED saw Dr. Lajoyce Corners in April of this year. 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 It is really not clear that this patient is been dressing this with anything. He came in with the clinic without any specific dressing on the wound areas using his own tennis shoe. He tells me that he only ambulates of course to do a pivot transfer. He does not have his prosthesis for the right leg as of yet and he blames Medicaid for this. He does however use the foot to push himself along in his wheelchair at home. The patient has not had formal arterial studies. ABI in our clinic on the left was 1.13. Patient's past medical history includes type 2 diabetes, fracture of the right fibula. I see that he was treated for abscesses on his right buttock and chest in 2016. I did not look at the microbiology of this. 04/28/18; patient comes back in the clinic today with the wound pretty much the same as when he came in here last week. Small opening lots of undermining relatively. He tells [place] that nothing is really been on this for 3 days in spite of the fact that we gave him enough to dress this easily especially such a small wound. He says he lives with his mother, she is not capable of assisting with this he is changing the dressings himself. 05/05/18; much better-looking wound today. Smaller. There is some undermining medially however that's only perhaps 2 mm. No surrounding erythema. We've been using silver collagen 05/12/18; small wound on the first metatarsal head. No undermining no surrounding erythema. We've been using silver collagen he has home health coming out to change 05/20/18 the wound on the first metatarsal head looks better. Covered in surface debris/callus nonviable tissue. Required debridement but post debridement  No Yes L02.415 Cutaneous abscess of right lower limb 01/27/2022 No Yes Inactive Problems Resolved Problems ICD-10 Code Description Active Date Resolved Date S81.801D Unspecified open wound, right lower leg, subsequent encounter 04/08/2021 04/08/2021 Electronic Signature(s) Signed: 02/10/2022 3:43:03 PM By: Geralyn Corwin DO Entered By: Geralyn Corwin on 02/10/2022 15:35:40 -------------------------------------------------------------------------------- Progress Note Details Patient Name: Date of Service: Bertram Millard ND, [name] 02/10/2022 2:15 PM Medical Record Number: [id number] Patient Account Number: [id number] Date of Birth/Sex: Treating RN: 1971/04/23 (51 y.o. M) Primary Care Provider: Lance Bosch Other Clinician: Referring Provider: Treating Provider/Extender: Carlyle Lipa in Treatment: 85 Subjective Chief Complaint Information obtained from Patient patient is here for a review of the wound on his plantar left first metatarsal head and back of his right leg History of Present Illness (HPI) 04/21/18 ADMISSION This is a 51 year old man who is a type II diabetic. In spite of fact his hemoglobin A1c is actually quite good 5.73 months ago he is at a really difficult time over the last 6-7 months. He developed a rapidly progressive infection in the right foot in November 2018 associated with osteomyelitis and  necrotizing fasciitis. 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 The stump required and a revision on 11/24/17. The stump revision was advertised as being secondary to falls. I'm not sure the progressive history here however this area is actually closed over. The patient tells me over the same timeframe he has had wounds on the plantar aspect of his left foot. In the ED saw Dr. Lajoyce Corners in April of this year. 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 It is really not clear that this patient is been dressing this with anything. He came in with the clinic without any specific dressing on the wound areas using his own tennis shoe. He tells me that he only ambulates of course to do a pivot transfer. He does not have his prosthesis for the right leg as of yet and he blames Medicaid for this. He does however use the foot to push himself along in his wheelchair at home. The patient has not had formal arterial studies. ABI in our clinic on the left was 1.13. Patient's past medical history includes type 2 diabetes, fracture of the right fibula. I see that he was treated for abscesses on his right buttock and chest in 2016. I did not look at the microbiology of this. 04/28/18; patient comes back in the clinic today with the wound pretty much the same as when he came in here last week. Small opening lots of undermining relatively. He tells [place] that nothing is really been on this for 3 days in spite of the fact that we gave him enough to dress this easily especially such a small wound. He says he lives with his mother, she is not capable of assisting with this he is changing the dressings himself. 05/05/18; much better-looking wound today. Smaller. There is some undermining medially however that's only perhaps 2 mm. No surrounding erythema. We've been using silver collagen 05/12/18; small wound on the first metatarsal head. No undermining no surrounding erythema. We've been using  silver collagen he has home health coming out to change 05/20/18 the wound on the first metatarsal head looks better. Covered in surface debris/callus nonviable tissue. Required debridement but post debridement this looks quite good. We've been using silver collagen. He has home health 05/27/18; first metatarsal head wound continues to improve. Just about completely closed.  for patient.. Cardiovascular 2+ dorsalis pedis/posterior tibialis pulses. Psychiatric pleasant and cooperative. Notes Left foot: First met head there is a slitlike wound with circumferential callus. There is granulation tissue at the opening. No signs of surrounding infection. Electronic Signature(s) Signed: 02/10/2022 3:43:03 PM By: Geralyn Corwin DO Entered By: Geralyn Corwin on 02/10/2022 15:37:55 -------------------------------------------------------------------------------- Physician Orders Details Patient Name: Date of Service: Bertram Millard ND, [name] 02/10/2022 2:15 PM Medical Record Number: [id number] Patient Account Number: [id number] Date of Birth/Sex: Treating RN: [birth year] (51 y.o. [name] Primary Care Provider: Lance Bosch Other Clinician: Referring Provider: Treating Provider/Extender: Carlyle Lipa in Treatment: 29 Verbal / Phone Orders: No Diagnosis Coding ICD-10 Coding Code Description [phone] Non-pressure chronic ulcer of other part of left foot with fat layer exposed E11.621 Type 2 diabetes mellitus with foot ulcer E11.42 Type 2 diabetes mellitus with diabetic polyneuropathy Z89.511 Acquired absence of right leg below knee L02.415 Cutaneous abscess of right lower limb Follow-up Appointments ppointment in 1 week. - with Dr. Mikey Bussing Room 8, [facility] Tuesday at 11am. Return A Other: - Ensure to offload and dressing changes as it is important to aid in wound healing. Edema Control - Lymphedema / SCD / Other Elevate legs to the level of the heart or above for 30 minutes daily and/or when sitting, a frequency of: - throughout the day Avoid standing for long periods of  time. Off-Loading Other: - minimize walking on left foot related to pressure to wound. Aid in wound healing. Wound Treatment Wound #2R - Metatarsal head first Wound Laterality: Left Cleanser: Soap and Water 1 x Per Day/30 Days Discharge Instructions: May shower and wash wound with dial antibacterial soap and water prior to dressing change. Cleanser: Wound Cleanser (Generic) 1 x Per Day/30 Days Discharge Instructions: Cleanse the wound with wound cleanser prior to applying a clean dressing using gauze sponges, not tissue or cotton balls. Peri-Wound Care: 40% Urea Cream 1 x Per Day/30 Days Discharge Instructions: Apply to callous around wound daily Topical: Gentamicin 1 x Per Day/30 Days Discharge Instructions: apply directly to wound. apply Q-tip to apply directly into wound bed. In clinic will apply gentamicin ointment to 2x2 gauze and lightly pack into wound bed. Secondary Dressing: Woven Gauze Sponges 2x2 in (Generic) 1 x Per Day/30 Days Discharge Instructions: Apply over primary dressing as directed. Secondary Dressing: Optifoam Non-Adhesive Dressing, 4x4 in (Generic) 1 x Per Day/30 Days Discharge Instructions: cut to make foam donut to help offload Secured With: Kerlix Roll Sterile, 4.5x3.1 (in/yd) (Generic) 1 x Per Day/30 Days Discharge Instructions: Secure with Kerlix as directed. Secured With: 105M Medipore H Soft Cloth Surgical Tape, 2x2 (in/yd) (Generic) 1 x Per Day/30 Days Discharge Instructions: Secure dressing with tape as directed. Electronic Signature(s) Signed: 02/10/2022 3:43:03 PM By: Geralyn Corwin DO Entered By: Geralyn Corwin on 02/10/2022 15:39:06 -------------------------------------------------------------------------------- Problem List Details Patient Name: Date of Service: Bertram Millard ND, [name] 02/10/2022 2:15 PM Medical Record Number: [id number] Patient Account Number: [id number] Date of Birth/Sex: Treating RN: 1971/08/27 (51 y.o. [name] Primary  Care Provider: Lance Bosch Other Clinician: Referring Provider: Treating Provider/Extender: Carlyle Lipa in Treatment: 35 Active Problems ICD-10 Encounter Code Description Active Date MDM Diagnosis [phone] Non-pressure chronic ulcer of other part of left foot with fat layer exposed 08/16/2020 No Yes E11.621 Type 2 diabetes mellitus with foot ulcer 08/16/2020 No Yes E11.42 Type 2 diabetes mellitus with diabetic polyneuropathy 08/16/2020 No Yes Z89.511 Acquired absence of right leg below knee 08/16/2020  BRIYAN, KLEVEN ([id number]) Visit Report for 02/10/2022 Chief Complaint Document Details Patient Name: Date of Service: Harriet Pho 02/10/2022 2:15 PM Medical Record Number: [id number] Patient Account Number: [id number] Date of Birth/Sex: Treating RN: [birth year] (51 y.o. M) Primary Care Provider: Lance Bosch Other Clinician: Referring Provider: Treating Provider/Extender: Carlyle Lipa in Treatment: 26 Information Obtained from: Patient Chief Complaint patient is here for a review of the wound on his plantar left first metatarsal head and back of his right leg Electronic Signature(s) Signed: 02/10/2022 3:43:03 PM By: Geralyn Corwin DO Entered By: Geralyn Corwin on 02/10/2022 15:35:55 -------------------------------------------------------------------------------- HPI Details Patient Name: Date of Service: [name], [name] 02/10/2022 2:15 PM Medical Record Number: [id number] Patient Account Number: [id number] Date of Birth/Sex: Treating RN: 02-Jun-1971 (51 y.o. M) Primary Care Provider: Lance Bosch Other Clinician: Referring Provider: Treating Provider/Extender: Carlyle Lipa in Treatment: 56 History of Present Illness HPI Description: 04/21/18 ADMISSION This is a 51 year old man who is a type II diabetic. In spite of fact his hemoglobin A1c is actually quite good 5.73 months ago he is at a really difficult time over the last 6-7 months. He developed a rapidly progressive infection in the right foot in November 2018 associated with osteomyelitis and necrotizing fasciitis. 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 The stump required and a revision on 11/24/17. The stump revision was advertised as being secondary to falls. I'm not sure the progressive history here however this area is actually closed over. The patient tells me over the same timeframe he has had wounds on the plantar aspect of his left foot. In the  ED saw Dr. Lajoyce Corners in April of this year. 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 It is really not clear that this patient is been dressing this with anything. He came in with the clinic without any specific dressing on the wound areas using his own tennis shoe. He tells me that he only ambulates of course to do a pivot transfer. He does not have his prosthesis for the right leg as of yet and he blames Medicaid for this. He does however use the foot to push himself along in his wheelchair at home. The patient has not had formal arterial studies. ABI in our clinic on the left was 1.13. Patient's past medical history includes type 2 diabetes, fracture of the right fibula. I see that he was treated for abscesses on his right buttock and chest in 2016. I did not look at the microbiology of this. 04/28/18; patient comes back in the clinic today with the wound pretty much the same as when he came in here last week. Small opening lots of undermining relatively. He tells [place] that nothing is really been on this for 3 days in spite of the fact that we gave him enough to dress this easily especially such a small wound. He says he lives with his mother, she is not capable of assisting with this he is changing the dressings himself. 05/05/18; much better-looking wound today. Smaller. There is some undermining medially however that's only perhaps 2 mm. No surrounding erythema. We've been using silver collagen 05/12/18; small wound on the first metatarsal head. No undermining no surrounding erythema. We've been using silver collagen he has home health coming out to change 05/20/18 the wound on the first metatarsal head looks better. Covered in surface debris/callus nonviable tissue. Required debridement but post debridement  complaints today. 11/3; patient presents for follow-up. He has no issues or complaints today. He states he does not want to follow-up with orthopedics to reevaluate the abscess in the back of his right leg. He denies signs of infection. 11/15; patient presents for follow-up. He has no issues or complaints today. He had been using silver alginate to the left first met head wound. He denies signs of infection. 11/22; patient presents for follow-up. He had Affinity placed at last clinic visit. He has no issues or complaints today. 11/29; patient presents for follow-up. He is in good spirits today. He has no issues or complaints today. 12/6; patient presents for follow-up. He has no issues or complaints today. 12/13; patient presents for follow-up. He denies signs of infection. He has no issues or complaints today. 12/20; patient presents for follow-up. He has no issues or complaints today. He denies signs of infection. 12/29; patient presents for follow-up. He states that a few days ago he has had reaccumulation of an abscess behind the right knee. He denies increased warmth or erythema to the area. He denies fever/chills or drainage. 1/6; patient presents for follow-up. He states he went to the [facility] emergency department to be evaluated for the abscess to his right posterior knee. He was started on clindamycin. They were unable to drain the abscess. Patient states that eventually the abscess ruptured. He reports improvement in his symptoms. He has no issues or complaints about his left foot wound. 1/17; patient presents for follow-up. He has no issues or complaints  today. 1/24; patient presents for follow-up. He has no issues or complaints today. He denies signs of infection. 1/31; patient presents for follow-up. He has been approved for Affinity however has a 20% co-pay. Patient has declined further Affinity treatments due to this. He currently denies signs of infection. 2/7; patient presents for follow-up. He has no issues or complaints today. He denies signs of infection. He has been doing Hydrofera Blue dressing changes. 2/14; patient presents for follow-up. He has no issues or complaints today. He has been using blast X with collagen to the wound bed. He currently denies any issues. 2/28; patient presents for follow-up. He denies signs of infection. He reports using blast X with collagen to the wound bed. 3/7; patient presents for follow-up. He states he has been using gentamicin ointment and urea cream with dressing changes. He reports that the back of his right knee has developed an abscess again and is draining. He states this started today. 02/03/2022: The patient came to clinic today without any dressing on his foot. He says that he took a shower yesterday and felt like he did not need a dressing and so he did not apply it. He is not offloading this foot and is wearing a regular athletic shoe. 3/21; patient presents for follow-up. He did not come in with a dressing on his wound. He states he has been using gentamicin ointment daily. He has a Pegasys insert to his tennis shoe for offloading. He currently denies signs of infection. Electronic Signature(s) Signed: 02/10/2022 3:43:03 PM By: Geralyn Corwin DO Entered By: Geralyn Corwin on 02/10/2022 15:36:51 -------------------------------------------------------------------------------- Physical Exam Details Patient Name: Date of Service: [name], [name] 02/10/2022 2:15 PM Medical Record Number: [id number] Patient Account Number: [id number] Date of Birth/Sex: Treating RN: Mar 31, 1971 (51 y.o.  M) Primary Care Provider: Lance Bosch Other Clinician: Referring Provider: Treating Provider/Extender: Carlyle Lipa in Treatment: 35 Constitutional respirations regular, non-labored and within target range  Use: Never; Drug Use: No History; Caffeine Use: Never; Financial Concerns: No; Food, Clothing or Shelter Needs: No; Support System Lacking: No; Transportation Concerns: No Electronic Signature(s) Signed: 02/10/2022 3:43:03 PM By: Geralyn Corwin  DO Entered By: Geralyn Corwin on 02/10/2022 15:36:58 -------------------------------------------------------------------------------- SuperBill Details Patient Name: Date of Service: Bertram Millard ND, [name] 02/10/2022 Medical Record Number: [id number] Patient Account Number: [id number] Date of Birth/Sex: Treating RN: Dec 16, 1970 (51 y.o. M) Primary Care Provider: Lance Bosch Other Clinician: Referring Provider: Treating Provider/Extender: Carlyle Lipa in Treatment: 24 Diagnosis Coding ICD-10 Codes Code Description [phone] Non-pressure chronic ulcer of other part of left foot with fat layer exposed E11.621 Type 2 diabetes mellitus with foot ulcer E11.42 Type 2 diabetes mellitus with diabetic polyneuropathy Z89.511 Acquired absence of right leg below knee L02.415 Cutaneous abscess of right lower limb Facility Procedures CPT4 Code: [id number] Description: 99213 - WOUND CARE VISIT-LEV 3 EST PT Modifier: Quantity: 1 Physician Procedures : CPT4 Code Description Modifier [id number] 99213 - WC PHYS LEVEL 3 - EST PT ICD-10 Diagnosis Description L97.522 Non-pressure chronic ulcer of other part of left foot with fat layer exposed E11.621 Type 2 diabetes mellitus with foot ulcer E11.42 Type 2  diabetes mellitus with diabetic polyneuropathy Z89.511 Acquired absence of right leg below knee Quantity: 1 Electronic Signature(s) Signed: 02/11/2022 2:19:30 PM By: Shawn Stall RN, BSN Signed: 02/12/2022 9:27:18 AM By: Geralyn Corwin DO Previous Signature: 02/10/2022 3:43:03 PM Version By: Geralyn Corwin DO Entered By: Shawn Stall on 02/11/2022 14:19:29  BRIYAN, KLEVEN ([id number]) Visit Report for 02/10/2022 Chief Complaint Document Details Patient Name: Date of Service: Harriet Pho 02/10/2022 2:15 PM Medical Record Number: [id number] Patient Account Number: [id number] Date of Birth/Sex: Treating RN: [birth year] (51 y.o. M) Primary Care Provider: Lance Bosch Other Clinician: Referring Provider: Treating Provider/Extender: Carlyle Lipa in Treatment: 26 Information Obtained from: Patient Chief Complaint patient is here for a review of the wound on his plantar left first metatarsal head and back of his right leg Electronic Signature(s) Signed: 02/10/2022 3:43:03 PM By: Geralyn Corwin DO Entered By: Geralyn Corwin on 02/10/2022 15:35:55 -------------------------------------------------------------------------------- HPI Details Patient Name: Date of Service: [name], [name] 02/10/2022 2:15 PM Medical Record Number: [id number] Patient Account Number: [id number] Date of Birth/Sex: Treating RN: 02-Jun-1971 (51 y.o. M) Primary Care Provider: Lance Bosch Other Clinician: Referring Provider: Treating Provider/Extender: Carlyle Lipa in Treatment: 56 History of Present Illness HPI Description: 04/21/18 ADMISSION This is a 51 year old man who is a type II diabetic. In spite of fact his hemoglobin A1c is actually quite good 5.73 months ago he is at a really difficult time over the last 6-7 months. He developed a rapidly progressive infection in the right foot in November 2018 associated with osteomyelitis and necrotizing fasciitis. 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 The stump required and a revision on 11/24/17. The stump revision was advertised as being secondary to falls. I'm not sure the progressive history here however this area is actually closed over. The patient tells me over the same timeframe he has had wounds on the plantar aspect of his left foot. In the  ED saw Dr. Lajoyce Corners in April of this year. 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 It is really not clear that this patient is been dressing this with anything. He came in with the clinic without any specific dressing on the wound areas using his own tennis shoe. He tells me that he only ambulates of course to do a pivot transfer. He does not have his prosthesis for the right leg as of yet and he blames Medicaid for this. He does however use the foot to push himself along in his wheelchair at home. The patient has not had formal arterial studies. ABI in our clinic on the left was 1.13. Patient's past medical history includes type 2 diabetes, fracture of the right fibula. I see that he was treated for abscesses on his right buttock and chest in 2016. I did not look at the microbiology of this. 04/28/18; patient comes back in the clinic today with the wound pretty much the same as when he came in here last week. Small opening lots of undermining relatively. He tells [place] that nothing is really been on this for 3 days in spite of the fact that we gave him enough to dress this easily especially such a small wound. He says he lives with his mother, she is not capable of assisting with this he is changing the dressings himself. 05/05/18; much better-looking wound today. Smaller. There is some undermining medially however that's only perhaps 2 mm. No surrounding erythema. We've been using silver collagen 05/12/18; small wound on the first metatarsal head. No undermining no surrounding erythema. We've been using silver collagen he has home health coming out to change 05/20/18 the wound on the first metatarsal head looks better. Covered in surface debris/callus nonviable tissue. Required debridement but post debridement

## 2022-02-10 NOTE — Progress Notes (Addendum)
Yes N/A N/A Exposed Structures: Fascia: No Tendon: No Muscle: No Joint: No Bone: No Large (67-100%) N/A N/A Epithelialization: callous periwound. N/A N/A Assessment Notes: Treatment Notes Electronic Signature(s) Signed: 02/10/2022 3:43:03 PM By: Geralyn Corwin DO Entered By: Geralyn Corwin on 02/10/2022 15:35:46 -------------------------------------------------------------------------------- Multi-Disciplinary Care Plan Details Patient Name: Date of Service: [name] [name] ND, [name] 02/10/2022 2:15 PM Medical Record Number: [id number] Patient Account Number: [id number] Date of Birth/Sex: Treating RN: [birth year] (51 y.o. [name] [name] Primary Care [name] [name]: [name] [name] Other Clinician: Referring [name] [name]: Treating [name] [name]/Extender: [name] [name] in Treatment: 50 Multidisciplinary Care Plan reviewed with physician Active Inactive Abuse / Safety / Falls / Self Care Management Nursing Diagnoses: History of Falls Goals: Patient/caregiver will verbalize/demonstrate measures taken to prevent injury and/or falls Date Initiated: 11/04/2021 Target Resolution Date: 03/19/2022 Goal Status: Active Interventions: Assess fall risk on admission and as needed Assess impairment of mobility on admission and as needed per policy Notes: Wound/Skin Impairment Nursing Diagnoses: Impaired tissue integrity Goals: Patient/caregiver will verbalize understanding of skin care regimen Date Initiated: 11/19/2020 Target Resolution Date: 03/19/2022 Goal Status: Active Ulcer/skin breakdown will have a volume reduction of 30% by week 4 Date Initiated:  08/16/2020 Date Inactivated: 03/03/2021 Target Resolution Date: 03/10/2021 Goal Status: Met Interventions: Provide education on ulcer and skin care Notes: 07/24/21: Wound care regimen ongoing. Electronic Signature(s) Signed: 02/10/2022 5:23:07 PM By: [name] Stall RN, BSN Entered By: [name] [name] on 02/10/2022 14:32:55 -------------------------------------------------------------------------------- Pain Assessment Details Patient Name: Date of Service: [name] [name] ND, [name] 02/10/2022 2:15 PM Medical Record Number: [id number] Patient Account Number: [id number] Date of Birth/Sex: Treating RN: 1971/11/08 (51 y.o. [name] [name] Primary Care Mishell Donalson: [name] [name] Other Clinician: Referring [name] [name]: Treating [name] [name]/Extender: [name] [name] in Treatment: 83 Active Problems Location of Pain Severity and Description of Pain Patient Has Paino No Site Locations Rate the pain. Rate the pain. Current Pain Level: 0 Pain Management and Medication Current Pain Management: Medication: No Cold Application: No Rest: No Massage: No Activity: No T.E.N.S.: No Heat Application: No Leg drop or elevation: No Is the Current Pain Management Adequate: Adequate How does your wound impact your activities of daily livingo Sleep: No Bathing: No Appetite: No Relationship With Others: No Bladder Continence: No Emotions: No Bowel Continence: No Work: No Toileting: No Drive: No Dressing: No Hobbies: No Psychologist, prison and probation services) Signed: 02/10/2022 5:23:07 PM By: [name] Stall RN, BSN Entered By: [name] [name] on 02/10/2022 14:28:19 -------------------------------------------------------------------------------- Patient/Caregiver Education Details Patient Name: Date of Service: [name] [name] 3/21/2023andnbsp2:15 PM Medical Record Number: [id number] Patient Account Number: [id number] Date of Birth/Gender: Treating RN: 1971/10/28 (51 y.o. [name] [name] Primary Care Physician: [name] [name] Other Clinician: Referring Physician: Treating Physician/Extender: [name] [name] in Treatment: 29 Education Assessment Education Provided To: Patient Education Topics Provided Wound/Skin Impairment: Handouts: Skin Care Do's and Dont's Methods: Explain/Verbal Responses: Reinforcements needed Electronic Signature(s) Signed: 02/10/2022 5:23:07 PM By: [name] Stall RN, BSN Entered By: [name] [name] on 02/10/2022 14:33:09 -------------------------------------------------------------------------------- Wound Assessment Details Patient Name: Date of Service: [name] [name] ND, [name] 02/10/2022 2:15 PM Medical Record Number: [id number] Patient Account Number: [id number] Date of Birth/Sex: Treating RN: [birth year] (51 y.o. [name] [name] Primary Care [name] [name]: [name] [name] Other Clinician: Referring [name] [name]: Treating [name] [name]/Extender: [name] [name] in Treatment: 8 Wound Status Wound Number: [id number] Primary Diabetic Wound/Ulcer of the Lower Extremity Etiology: Wound Location: Left Metatarsal head first Wound Status: Open Wounding Event: Blister Comorbid Anemia, Hypertension, Peripheral  Venous Disease, Type II Date Acquired: 06/23/2020 History: Diabetes Weeks Of Treatment: 77 Clustered Wound: No Photos Wound Measurements Length: (cm) 0.3 Width: (cm) 0.1 Depth: (cm) 0.2 Area: (cm) 0.024 Volume: (cm) 0.005 % Reduction in Area: 22.6% % Reduction in Volume: -66.7% Epithelialization: Large (67-100%) Undermining: Yes Starting Position (o'clock): 12 Ending Position (o'clock): 12 Maximum Distance: (cm) 0.3 Wound Description Classification: Grade 2 Wound Margin: Distinct, outline attached Exudate Amount: Small Exudate Type: Serosanguineous Exudate Color: red, brown Foul Odor After Cleansing: No Slough/Fibrino No Wound Bed Granulation Amount: Large (67-100%) Exposed  Structure Granulation Quality: Red, Pale Fascia Exposed: No Necrotic Amount: None Present (0%) Fat Layer (Subcutaneous Tissue) Exposed: Yes Tendon Exposed: No Muscle Exposed: No Joint Exposed: No Bone Exposed: No Assessment Notes callous periwound. Electronic Signature(s) Signed: 02/10/2022 5:23:07 PM By: [name] Stall RN, BSN Entered By: [name] [name] on 02/10/2022 14:32:57 -------------------------------------------------------------------------------- Vitals Details Patient Name: Date of Service: [name] [name] ND, [name] 02/10/2022 2:15 PM Medical Record Number: [id number] Patient Account Number: [id number] Date of Birth/Sex: Treating RN: 1970-12-11 (51 y.o. [name] [name] Primary Care Kiele Heavrin: [name] [name] Other Clinician: Referring Shereta Crothers: Treating Dandrea Widdowson/Extender: [name] [name] in Treatment: 56 Vital Signs Time Taken: 14:26 Temperature (F): 98.5 Height (in): 77 Pulse (bpm): 83 Weight (lbs): 232 Respiratory Rate (breaths/min): 16 Body Mass Index (BMI): 27.5 Blood Pressure (mmHg): 160/89 Capillary Blood Glucose (mg/dl): 147 Reference Range: 80 - 120 mg / dl Notes per patient at last PCP appt weighed at 265 and now 232 with using lasix. Electronic Signature(s) Signed: 02/10/2022 5:23:07 PM By: [name] Stall RN, BSN Entered By: [name] [name] on 02/10/2022 14:28:11  Venous Disease, Type II Date Acquired: 06/23/2020 History: Diabetes Weeks Of Treatment: 77 Clustered Wound: No Photos Wound Measurements Length: (cm) 0.3 Width: (cm) 0.1 Depth: (cm) 0.2 Area: (cm) 0.024 Volume: (cm) 0.005 % Reduction in Area: 22.6% % Reduction in Volume: -66.7% Epithelialization: Large (67-100%) Undermining: Yes Starting Position (o'clock): 12 Ending Position (o'clock): 12 Maximum Distance: (cm) 0.3 Wound Description Classification: Grade 2 Wound Margin: Distinct, outline attached Exudate Amount: Small Exudate Type: Serosanguineous Exudate Color: red, brown Foul Odor After Cleansing: No Slough/Fibrino No Wound Bed Granulation Amount: Large (67-100%) Exposed  Structure Granulation Quality: Red, Pale Fascia Exposed: No Necrotic Amount: None Present (0%) Fat Layer (Subcutaneous Tissue) Exposed: Yes Tendon Exposed: No Muscle Exposed: No Joint Exposed: No Bone Exposed: No Assessment Notes callous periwound. Electronic Signature(s) Signed: 02/10/2022 5:23:07 PM By: [name] Stall RN, BSN Entered By: [name] [name] on 02/10/2022 14:32:57 -------------------------------------------------------------------------------- Vitals Details Patient Name: Date of Service: [name] [name] ND, [name] 02/10/2022 2:15 PM Medical Record Number: [id number] Patient Account Number: [id number] Date of Birth/Sex: Treating RN: 1970-12-11 (51 y.o. [name] [name] Primary Care Kiele Heavrin: [name] [name] Other Clinician: Referring Shereta Crothers: Treating Dandrea Widdowson/Extender: [name] [name] in Treatment: 56 Vital Signs Time Taken: 14:26 Temperature (F): 98.5 Height (in): 77 Pulse (bpm): 83 Weight (lbs): 232 Respiratory Rate (breaths/min): 16 Body Mass Index (BMI): 27.5 Blood Pressure (mmHg): 160/89 Capillary Blood Glucose (mg/dl): 147 Reference Range: 80 - 120 mg / dl Notes per patient at last PCP appt weighed at 265 and now 232 with using lasix. Electronic Signature(s) Signed: 02/10/2022 5:23:07 PM By: [name] Stall RN, BSN Entered By: [name] [name] on 02/10/2022 14:28:11  Venous Disease, Type II Date Acquired: 06/23/2020 History: Diabetes Weeks Of Treatment: 77 Clustered Wound: No Photos Wound Measurements Length: (cm) 0.3 Width: (cm) 0.1 Depth: (cm) 0.2 Area: (cm) 0.024 Volume: (cm) 0.005 % Reduction in Area: 22.6% % Reduction in Volume: -66.7% Epithelialization: Large (67-100%) Undermining: Yes Starting Position (o'clock): 12 Ending Position (o'clock): 12 Maximum Distance: (cm) 0.3 Wound Description Classification: Grade 2 Wound Margin: Distinct, outline attached Exudate Amount: Small Exudate Type: Serosanguineous Exudate Color: red, brown Foul Odor After Cleansing: No Slough/Fibrino No Wound Bed Granulation Amount: Large (67-100%) Exposed  Structure Granulation Quality: Red, Pale Fascia Exposed: No Necrotic Amount: None Present (0%) Fat Layer (Subcutaneous Tissue) Exposed: Yes Tendon Exposed: No Muscle Exposed: No Joint Exposed: No Bone Exposed: No Assessment Notes callous periwound. Electronic Signature(s) Signed: 02/10/2022 5:23:07 PM By: [name] Stall RN, BSN Entered By: [name] [name] on 02/10/2022 14:32:57 -------------------------------------------------------------------------------- Vitals Details Patient Name: Date of Service: [name] [name] ND, [name] 02/10/2022 2:15 PM Medical Record Number: [id number] Patient Account Number: [id number] Date of Birth/Sex: Treating RN: 1970-12-11 (51 y.o. [name] [name] Primary Care Kiele Heavrin: [name] [name] Other Clinician: Referring Shereta Crothers: Treating Dandrea Widdowson/Extender: [name] [name] in Treatment: 56 Vital Signs Time Taken: 14:26 Temperature (F): 98.5 Height (in): 77 Pulse (bpm): 83 Weight (lbs): 232 Respiratory Rate (breaths/min): 16 Body Mass Index (BMI): 27.5 Blood Pressure (mmHg): 160/89 Capillary Blood Glucose (mg/dl): 147 Reference Range: 80 - 120 mg / dl Notes per patient at last PCP appt weighed at 265 and now 232 with using lasix. Electronic Signature(s) Signed: 02/10/2022 5:23:07 PM By: [name] Stall RN, BSN Entered By: [name] [name] on 02/10/2022 14:28:11

## 2022-02-17 ENCOUNTER — Other Ambulatory Visit: Payer: Self-pay

## 2022-02-17 ENCOUNTER — Encounter (HOSPITAL_BASED_OUTPATIENT_CLINIC_OR_DEPARTMENT_OTHER): Payer: Medicare Other | Admitting: Internal Medicine

## 2022-02-17 DIAGNOSIS — E1142 Type 2 diabetes mellitus with diabetic polyneuropathy: Secondary | ICD-10-CM | POA: Diagnosis not present

## 2022-02-17 DIAGNOSIS — L97522 Non-pressure chronic ulcer of other part of left foot with fat layer exposed: Secondary | ICD-10-CM

## 2022-02-17 DIAGNOSIS — Z89511 Acquired absence of right leg below knee: Secondary | ICD-10-CM | POA: Diagnosis not present

## 2022-02-17 DIAGNOSIS — E11621 Type 2 diabetes mellitus with foot ulcer: Secondary | ICD-10-CM

## 2022-02-17 NOTE — Progress Notes (Signed)
N/A Wound Status: Yes N/A N/A Wound Recurrence: 0x0x0 N/A N/A Measurements L x W x D (cm) 0 N/A N/A A (cm) : rea 0 N/A N/A Volume (cm) : 100.00% N/A N/A % Reduction in A rea: 100.00% N/A N/A % Reduction in Volume: Grade 2 N/A N/A Classification: None Present N/A N/A Exudate A mount: Distinct, outline attached N/A N/A Wound Margin: None Present (0%) N/A N/A Granulation A mount: None Present (0%) N/A N/A Necrotic A mount: Fascia: No N/A N/A Exposed Structures: Fat Layer (Subcutaneous Tissue): No Tendon: No Muscle: No Joint: No Bone: No Large (67-100%) N/A N/A Epithelialization: callous wound. N/A N/A Assessment Notes: Treatment Notes Electronic Signature(s) Signed: 02/17/2022 3:12:23 PM By: Geralyn Corwin DO Entered By: Geralyn Corwin on 02/17/2022 13:11:16 -------------------------------------------------------------------------------- Multi-Disciplinary Care Plan Details Patient Name: Date of Service: [name] [name] ND, [name] 02/17/2022 11:15 A M Medical Record Number: [id number] Patient Account Number: [id number] Date of Birth/Sex: Treating RN: [birth year] (51 y.o. [name] Primary Care Intisar Claudio: Lance Bosch Other Clinician: Referring Andra Heslin: Treating Tashauna Caisse/Extender: Carlyle Lipa in Treatment: 19 Multidisciplinary Care Plan reviewed with physician Active  Inactive Electronic Signature(s) Signed: 02/17/2022 4:42:50 PM By: Shawn Stall RN, BSN Entered By: Shawn Stall on 02/17/2022 11:51:23 -------------------------------------------------------------------------------- Pain Assessment Details Patient Name: Date of Service: [name] [name] ND, [name] 02/17/2022 11:15 A M Medical Record Number: [id number] Patient Account Number: [id number] Date of Birth/Sex: Treating RN: December 10, 1970 (51 y.o. [name] Primary Care Leeanne Butters: Lance Bosch Other Clinician: Referring Enaya Howze: Treating Areeb Corron/Extender: Carlyle Lipa in Treatment: 54 Active Problems Location of Pain Severity and Description of Pain Patient Has Paino Yes Site Locations Pain Location: Generalized Pain, Pain in Ulcers Rate the pain. Current Pain Level: 3 Pain Management and Medication Current Pain Management: Medication: No Cold Application: No Rest: No Massage: No Activity: No T.E.N.S.: No Heat Application: No Leg drop or elevation: No Is the Current Pain Management Adequate: Adequate How does your wound impact your activities of daily livingo Sleep: No Bathing: No Appetite: No Relationship With Others: No Bladder Continence: No Emotions: No Bowel Continence: No Work: No Toileting: No Drive: No Dressing: No Hobbies: No Psychologist, prison and probation services) Signed: 02/17/2022 4:42:50 PM By: Shawn Stall RN, BSN Entered By: Shawn Stall on 02/17/2022 11:32:02 -------------------------------------------------------------------------------- Patient/Caregiver Education Details Patient Name: Date of Service: [name] [name] 3/28/2023andnbsp11:15 A M Medical Record Number: [id number] Patient Account Number: [id number] Date of Birth/Gender: Treating RN: Apr 05, 1971 (51 y.o. [name] Primary Care Physician: Lance Bosch Other Clinician: Referring Physician: Treating Physician/Extender: Carlyle Lipa in Treatment: 9 Education Assessment Education Provided To: Patient Education Topics Provided Wound/Skin Impairment: Handouts: Skin Care Do's and Dont's Methods: Explain/Verbal Responses: Reinforcements needed Electronic Signature(s) Signed: 02/17/2022 4:42:50 PM By: Shawn Stall RN, BSN Entered By: Shawn Stall on 02/17/2022 11:51:37 -------------------------------------------------------------------------------- Wound Assessment Details Patient Name: Date of Service: [name] [name] ND, [name] 02/17/2022 11:15 A M Medical Record Number: [id number] Patient Account Number: [id number] Date of Birth/Sex: Treating RN: 11-08-1971 (51 y.o. [name] Primary Care Takaya Hyslop: Lance Bosch Other Clinician: Referring Latoyia Tecson: Treating Rina Adney/Extender: Carlyle Lipa in Treatment: 12 Wound Status Wound Number: [id number] Primary Diabetic Wound/Ulcer of the Lower Extremity Etiology: Wound Location: Left Metatarsal head first Wound Status: Healed - Epithelialized Wounding Event: Blister Comorbid Anemia, Hypertension, Peripheral Venous Disease, Type II Date Acquired: 06/23/2020 History: Diabetes Weeks Of Treatment: 78 Clustered Wound: No Photos Wound Measurements Length: (cm) Width: (cm) Depth: (cm) Area: (  Cleansing - multiple wounds X- 1 5 Wound Imaging (photographs - any number of wounds) []  - 0 Wound Tracing (instead of photographs) X- 1 5 Simple Wound Measurement - one wound []  - 0 Complex Wound Measurement - multiple wounds INTERVENTIONS - Wound Dressings []  - 0 Small Wound Dressing one or multiple wounds []  - 0 Medium Wound Dressing one or multiple wounds []  - 0 Large Wound Dressing one or multiple wounds []  - 0 Application of Medications - topical []  - 0 Application of Medications - injection INTERVENTIONS - Miscellaneous []  - 0 External ear exam []  - 0 Specimen Collection (cultures, biopsies, blood, body fluids, etc.) []  - 0 Specimen(s) / Culture(s) sent or taken to Lab for analysis []  - 0 Patient Transfer (multiple staff / Nurse, adult / Similar devices) []  - 0 Simple Staple / Suture removal (25 or less) []  - 0 Complex Staple / Suture removal (26 or more) []  - 0 Hypo / Hyperglycemic Management (close monitor of Blood Glucose) []  - 0 Ankle / Brachial Index (ABI) - do not check if billed separately X- 1 5 Vital Signs Has the patient been seen at the hospital within the last three years: Yes Total Score: 80 Level Of Care: New/Established - Level 3 Electronic Signature(s) Signed: 02/17/2022 4:42:50 PM By: Shawn Stall RN, BSN Entered By: Shawn Stall on 02/17/2022 11:52:06 -------------------------------------------------------------------------------- Encounter Discharge Information Details Patient Name: Date of Service: [name] [name] ND, [name] 02/17/2022 11:15 A M Medical Record Number: [id number] Patient Account Number: [id number] Date of Birth/Sex: Treating RN: [birth year] (51 y.o. [name] Primary Care Nehemiah Mcfarren: Lance Bosch Other Clinician: Referring Tamsen Reist: Treating Clothilde Tippetts/Extender:  Carlyle Lipa in Treatment: 66 Encounter Discharge Information Items Discharge Condition: Stable Ambulatory Status: Ambulatory Discharge Destination: Home Transportation: Private Auto Accompanied By: self Schedule Follow-up Appointment: No Clinical Summary of Care: Notes lotion applied to foot and callous area as directed by Crit Obremski. Electronic Signature(s) Signed: 02/17/2022 4:42:50 PM By: Shawn Stall RN, BSN Entered By: Shawn Stall on 02/17/2022 11:52:51 -------------------------------------------------------------------------------- Lower Extremity Assessment Details Patient Name: Date of Service: [name], [name] 02/17/2022 11:15 A M Medical Record Number: [id number] Patient Account Number: [id number] Date of Birth/Sex: Treating RN: 1970-12-16 (51 y.o. [name] Primary Care Hazem Kenner: Lance Bosch Other Clinician: Referring Shamyra Farias: Treating Travonte Byard/Extender: Carlyle Lipa in Treatment: 20 Edema Assessment Assessed: Kyra Searles: Yes] Franne Forts: No] Edema: [Left: N] [Right: o] Calf Left: Right: Point of Measurement: 50 cm From Medial Instep 40 cm Ankle Left: Right: Point of Measurement: 11 cm From Medial Instep 25 cm Vascular Assessment Pulses: Dorsalis Pedis Palpable: [Left:Yes] Electronic Signature(s) Signed: 02/17/2022 4:42:50 PM By: Shawn Stall RN, BSN Entered By: Shawn Stall on 02/17/2022 11:32:12 -------------------------------------------------------------------------------- Multi Wound Chart Details Patient Name: Date of Service: [name] [name] ND, [name] 02/17/2022 11:15 A M Medical Record Number: [id number] Patient Account Number: [id number] Date of Birth/Sex: Treating RN: [birth year] (51 y.o. M) Primary Care Blanche Gallien: Lance Bosch Other Clinician: Referring Kamonte Mcmichen: Treating Stephania Macfarlane/Extender: Carlyle Lipa in Treatment: 77 Vital Signs Height(in):  77 Capillary Blood Glucose(mg/dl): 82 Weight(lbs): 696 Pulse(bpm): 69 Body Mass Index(BMI): 27.5 Blood Pressure(mmHg): 165/91 Temperature(F): 97.7 Respiratory Rate(breaths/min): 16 Photos: [N/A:N/A] Left Metatarsal head first N/A N/A Wound Location: Blister N/A N/A Wounding Event: Diabetic Wound/Ulcer of the Lower N/A N/A Primary Etiology: Extremity Anemia, Hypertension, Peripheral N/A N/A Comorbid History: Venous Disease, Type II Diabetes 06/23/2020 N/A N/A Date Acquired: 1 N/A N/A Weeks of Treatment: Healed - Epithelialized N/A  cm) Volume: (cm) 0 % Reduction in Area: 100% 0 % Reduction in Volume: 100% 0 Epithelialization: Large (67-100%) 0 Tunneling: No 0 Undermining: No Wound Description Classification: Grade 2 Wound Margin: Distinct, outline attached Exudate Amount: None Present Foul Odor After Cleansing: No Slough/Fibrino No Wound Bed Granulation Amount: None Present (0%) Exposed Structure Necrotic Amount: None Present (0%) Fascia Exposed: No Fat Layer (Subcutaneous Tissue) Exposed: No Tendon Exposed: No Muscle Exposed: No Joint Exposed: No Bone Exposed: No Assessment Notes callous wound. Electronic Signature(s) Signed: 02/17/2022 4:42:50 PM By:  Shawn Stall RN, BSN Entered By: Shawn Stall on 02/17/2022 11:49:43 -------------------------------------------------------------------------------- Vitals Details Patient Name: Date of Service: [name], [name] 02/17/2022 11:15 A M Medical Record Number: [id number] Patient Account Number: [id number] Date of Birth/Sex: Treating RN: [birth year] (51 y.o. [name] Primary Care Alithia Zavaleta: Lance Bosch Other Clinician: Referring Dayshia Ballinas: Treating Geralda Baumgardner/Extender: Carlyle Lipa in Treatment: 72 Vital Signs Time Taken: 11:30 Temperature (F): 97.7 Height (in): 77 Pulse (bpm): 69 Weight (lbs): 232 Respiratory Rate (breaths/min): 16 Body Mass Index (BMI): 27.5 Blood Pressure (mmHg): 165/91 Capillary Blood Glucose (mg/dl): 82 Reference Range: 80 - 120 mg / dl Electronic Signature(s) Signed: 02/17/2022 4:42:50 PM By: Shawn Stall RN, BSN Entered By: Shawn Stall on 02/17/2022 11:31:51  cm) Volume: (cm) 0 % Reduction in Area: 100% 0 % Reduction in Volume: 100% 0 Epithelialization: Large (67-100%) 0 Tunneling: No 0 Undermining: No Wound Description Classification: Grade 2 Wound Margin: Distinct, outline attached Exudate Amount: None Present Foul Odor After Cleansing: No Slough/Fibrino No Wound Bed Granulation Amount: None Present (0%) Exposed Structure Necrotic Amount: None Present (0%) Fascia Exposed: No Fat Layer (Subcutaneous Tissue) Exposed: No Tendon Exposed: No Muscle Exposed: No Joint Exposed: No Bone Exposed: No Assessment Notes callous wound. Electronic Signature(s) Signed: 02/17/2022 4:42:50 PM By:  Shawn Stall RN, BSN Entered By: Shawn Stall on 02/17/2022 11:49:43 -------------------------------------------------------------------------------- Vitals Details Patient Name: Date of Service: [name], [name] 02/17/2022 11:15 A M Medical Record Number: [id number] Patient Account Number: [id number] Date of Birth/Sex: Treating RN: [birth year] (51 y.o. [name] Primary Care Alithia Zavaleta: Lance Bosch Other Clinician: Referring Dayshia Ballinas: Treating Geralda Baumgardner/Extender: Carlyle Lipa in Treatment: 72 Vital Signs Time Taken: 11:30 Temperature (F): 97.7 Height (in): 77 Pulse (bpm): 69 Weight (lbs): 232 Respiratory Rate (breaths/min): 16 Body Mass Index (BMI): 27.5 Blood Pressure (mmHg): 165/91 Capillary Blood Glucose (mg/dl): 82 Reference Range: 80 - 120 mg / dl Electronic Signature(s) Signed: 02/17/2022 4:42:50 PM By: Shawn Stall RN, BSN Entered By: Shawn Stall on 02/17/2022 11:31:51

## 2022-02-17 NOTE — Progress Notes (Signed)
issues or complaints today. 11/3; patient presents for follow-up. He has no issues or complaints today. He states he does not want to follow-up with orthopedics to reevaluate the abscess in the back of his right leg. He denies signs of infection. 11/15; patient presents for follow-up. He has no issues or complaints today. He had been using silver alginate to the left first met head wound. He denies signs of infection. 11/22; patient presents for follow-up. He had Affinity placed at last clinic visit. He has no issues or complaints today. 11/29; patient presents for follow-up. He is in good spirits today. He has no issues or complaints today. 12/6; patient presents for follow-up. He has no issues or complaints today. 12/13; patient presents for follow-up. He denies signs of infection. He has no issues or complaints today. 12/20; patient presents for follow-up. He has no issues or complaints today. He denies signs of infection. 12/29; patient presents for follow-up. He states that a few days ago he has had reaccumulation of an abscess behind the right knee. He denies increased warmth or erythema to the area. He denies fever/chills or drainage. 1/6; patient presents for follow-up. He states he went to the [facility] emergency department to be evaluated for the abscess to his right posterior knee. He was started on clindamycin. They were unable to drain the abscess. Patient states that eventually the abscess ruptured. He reports improvement in his symptoms. He has no issues or complaints about his left foot wound. 1/17; patient presents for follow-up. He has no issues or complaints  today. 1/24; patient presents for follow-up. He has no issues or complaints today. He denies signs of infection. 1/31; patient presents for follow-up. He has been approved for Affinity however has a 20% co-pay. Patient has declined further Affinity treatments due to this. He currently denies signs of infection. 2/7; patient presents for follow-up. He has no issues or complaints today. He denies signs of infection. He has been doing Hydrofera Blue dressing changes. 2/14; patient presents for follow-up. He has no issues or complaints today. He has been using blast X with collagen to the wound bed. He currently denies any issues. 2/28; patient presents for follow-up. He denies signs of infection. He reports using blast X with collagen to the wound bed. 3/7; patient presents for follow-up. He states he has been using gentamicin ointment and urea cream with dressing changes. He reports that the back of his right knee has developed an abscess again and is draining. He states this started today. 02/03/2022: The patient came to clinic today without any dressing on his foot. He says that he took a shower yesterday and felt like he did not need a dressing and so he did not apply it. He is not offloading this foot and is wearing a regular athletic shoe. 3/21; patient presents for follow-up. He did not come in with a dressing on his wound. He states he has been using gentamicin ointment daily. He has a Pegasys insert to his tennis shoe for offloading. He currently denies signs of infection. 3/28; patient presents for follow-up. He reports using gentamicin ointment to the wound bed. He has no issues or complaints today. Electronic Signature(s) Signed: 02/17/2022 3:12:23 PM By: Geralyn Corwin DO Entered By: Geralyn Corwin on 02/17/2022 13:19:52 -------------------------------------------------------------------------------- Physical Exam Details Patient Name: Date of Service: [name], [name]  02/17/2022 11:15 A M Medical Record Number: [id number] Patient Account Number: [id number] Date of Birth/Sex: Treating RN: Nov 14, 1971 (51 y.o. M) Primary Care Provider:  Lance Bosch Other Clinician: Referring Provider: Treating Provider/Extender: Carlyle Lipa in Treatment: 20 Constitutional respirations regular, non-labored and within target range for patient.. Cardiovascular 2+ dorsalis pedis/posterior tibialis pulses. Psychiatric pleasant and cooperative. Notes Left foot: There is callus to the previous wound site. No open wounds noted. No signs of surrounding infection. Electronic Signature(s) Signed: 02/17/2022 3:12:23 PM By: Geralyn Corwin DO Entered By: Geralyn Corwin on 02/17/2022 13:20:29 -------------------------------------------------------------------------------- Physician Orders Details Patient Name: Date of Service: Bertram Millard ND, [name] 02/17/2022 11:15 A M Medical Record Number: [id number] Patient Account Number: [id number] Date of Birth/Sex: Treating RN: Apr 03, 1971 (51 y.o. [name] Primary Care Provider: Lance Bosch Other Clinician: Referring Provider: Treating Provider/Extender: Carlyle Lipa in Treatment: 59 Verbal / Phone Orders: No Diagnosis Coding Discharge From [facility] Discharge from [facility] - apply urea cream or lotion to callous area for protection. closely monitor feet daily. Electronic Signature(s) Signed: 02/17/2022 3:12:23 PM By: Geralyn Corwin DO Entered By: Geralyn Corwin on 02/17/2022 13:20:53 -------------------------------------------------------------------------------- Problem List Details Patient Name: Date of Service: Bertram Millard ND, [name] 02/17/2022 11:15 A M Medical Record Number: [id number] Patient Account Number: [id number] Date of Birth/Sex: Treating RN: 16-Dec-1970 (51 y.o. M) Primary Care Provider: Lance Bosch Other Clinician: Referring  Provider: Treating Provider/Extender: Carlyle Lipa in Treatment: 72 Active Problems ICD-10 Encounter Code Description Active Date MDM Diagnosis L97.522 Non-pressure chronic ulcer of other part of left foot with fat layer exposed 08/16/2020 No Yes E11.621 Type 2 diabetes mellitus with foot ulcer 08/16/2020 No Yes E11.42 Type 2 diabetes mellitus with diabetic polyneuropathy 08/16/2020 No Yes Z89.511 Acquired absence of right leg below knee 08/16/2020 No Yes L02.415 Cutaneous abscess of right lower limb 01/27/2022 No Yes Inactive Problems Resolved Problems ICD-10 Code Description Active Date Resolved Date S81.801D Unspecified open wound, right lower leg, subsequent encounter 04/08/2021 04/08/2021 Electronic Signature(s) Signed: 02/17/2022 3:12:23 PM By: Geralyn Corwin DO Entered By: Geralyn Corwin on 02/17/2022 13:10:15 -------------------------------------------------------------------------------- Progress Note Details Patient Name: Date of Service: [name], [name] 02/17/2022 11:15 A M Medical Record Number: [id number] Patient Account Number: [id number] Date of Birth/Sex: Treating RN: 14-Dec-1970 (51 y.o. M) Primary Care Provider: Lance Bosch Other Clinician: Referring Provider: Treating Provider/Extender: Carlyle Lipa in Treatment: 84 Subjective Chief Complaint Information obtained from Patient patient is here for a review of the wound on his plantar left first metatarsal head and back of his right leg History of Present Illness (HPI) 04/21/18 ADMISSION This is a 51 year old man who is a type II diabetic. In spite of fact his hemoglobin A1c is actually quite good 5.73 months ago he is at a really difficult time over the last 6-7 months. He developed a rapidly progressive infection in the right foot in November 2018 associated with osteomyelitis and necrotizing fasciitis. 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 The stump  required and a revision on 11/24/17. The stump revision was advertised as being secondary to falls. I'm not sure the progressive history here however this area is actually closed over. The patient tells me over the same timeframe he has had wounds on the plantar aspect of his left foot. In the ED saw Dr. Lajoyce Corners in April of this year. 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 It is really not clear that this patient is been dressing this with anything. He came in with the clinic without any specific dressing on  [name], [name] 02/17/2022 Medical Record Number: [id number] Patient  Account Number: [id number] Date of Birth/Sex: Treating RN: [birth year] (51 y.o. Harlon Flor, Millard.Loa Primary Care Provider: Lance Bosch Other Clinician: Referring Provider: Treating Provider/Extender: Carlyle Lipa in Treatment: 8 Diagnosis Coding ICD-10 Codes Code Description [phone] Non-pressure chronic ulcer of other part of left foot with fat layer exposed E11.621 Type 2 diabetes mellitus with foot ulcer E11.42 Type 2 diabetes mellitus with diabetic polyneuropathy Z89.511 Acquired absence of right leg below knee L02.415 Cutaneous abscess of right lower limb Facility Procedures CPT4 Code: 62952841 Description: 99213 - WOUND CARE VISIT-LEV 3 EST PT Modifier: Quantity: 1 Physician Procedures : CPT4 Code Description Modifier [id number] 99213 - WC PHYS LEVEL 3 - EST PT ICD-10 Diagnosis Description L97.522 Non-pressure chronic ulcer of other part of left foot with fat layer exposed E11.621 Type 2 diabetes mellitus with foot ulcer E11.42 Type 2  diabetes mellitus with diabetic polyneuropathy Z89.511 Acquired absence of right leg below knee Quantity: 1 Electronic Signature(s) Signed: 02/17/2022 3:12:23 PM By: Geralyn Corwin DO Entered By: Geralyn Corwin on 02/17/2022 13:22:03  [name], [name] 02/17/2022 Medical Record Number: [id number] Patient  Account Number: [id number] Date of Birth/Sex: Treating RN: [birth year] (51 y.o. Harlon Flor, Millard.Loa Primary Care Provider: Lance Bosch Other Clinician: Referring Provider: Treating Provider/Extender: Carlyle Lipa in Treatment: 8 Diagnosis Coding ICD-10 Codes Code Description [phone] Non-pressure chronic ulcer of other part of left foot with fat layer exposed E11.621 Type 2 diabetes mellitus with foot ulcer E11.42 Type 2 diabetes mellitus with diabetic polyneuropathy Z89.511 Acquired absence of right leg below knee L02.415 Cutaneous abscess of right lower limb Facility Procedures CPT4 Code: 62952841 Description: 99213 - WOUND CARE VISIT-LEV 3 EST PT Modifier: Quantity: 1 Physician Procedures : CPT4 Code Description Modifier [id number] 99213 - WC PHYS LEVEL 3 - EST PT ICD-10 Diagnosis Description L97.522 Non-pressure chronic ulcer of other part of left foot with fat layer exposed E11.621 Type 2 diabetes mellitus with foot ulcer E11.42 Type 2  diabetes mellitus with diabetic polyneuropathy Z89.511 Acquired absence of right leg below knee Quantity: 1 Electronic Signature(s) Signed: 02/17/2022 3:12:23 PM By: Geralyn Corwin DO Entered By: Geralyn Corwin on 02/17/2022 13:22:03  issues or complaints today. 11/3; patient presents for follow-up. He has no issues or complaints today. He states he does not want to follow-up with orthopedics to reevaluate the abscess in the back of his right leg. He denies signs of infection. 11/15; patient presents for follow-up. He has no issues or complaints today. He had been using silver alginate to the left first met head wound. He denies signs of infection. 11/22; patient presents for follow-up. He had Affinity placed at last clinic visit. He has no issues or complaints today. 11/29; patient presents for follow-up. He is in good spirits today. He has no issues or complaints today. 12/6; patient presents for follow-up. He has no issues or complaints today. 12/13; patient presents for follow-up. He denies signs of infection. He has no issues or complaints today. 12/20; patient presents for follow-up. He has no issues or complaints today. He denies signs of infection. 12/29; patient presents for follow-up. He states that a few days ago he has had reaccumulation of an abscess behind the right knee. He denies increased warmth or erythema to the area. He denies fever/chills or drainage. 1/6; patient presents for follow-up. He states he went to the [facility] emergency department to be evaluated for the abscess to his right posterior knee. He was started on clindamycin. They were unable to drain the abscess. Patient states that eventually the abscess ruptured. He reports improvement in his symptoms. He has no issues or complaints about his left foot wound. 1/17; patient presents for follow-up. He has no issues or complaints  today. 1/24; patient presents for follow-up. He has no issues or complaints today. He denies signs of infection. 1/31; patient presents for follow-up. He has been approved for Affinity however has a 20% co-pay. Patient has declined further Affinity treatments due to this. He currently denies signs of infection. 2/7; patient presents for follow-up. He has no issues or complaints today. He denies signs of infection. He has been doing Hydrofera Blue dressing changes. 2/14; patient presents for follow-up. He has no issues or complaints today. He has been using blast X with collagen to the wound bed. He currently denies any issues. 2/28; patient presents for follow-up. He denies signs of infection. He reports using blast X with collagen to the wound bed. 3/7; patient presents for follow-up. He states he has been using gentamicin ointment and urea cream with dressing changes. He reports that the back of his right knee has developed an abscess again and is draining. He states this started today. 02/03/2022: The patient came to clinic today without any dressing on his foot. He says that he took a shower yesterday and felt like he did not need a dressing and so he did not apply it. He is not offloading this foot and is wearing a regular athletic shoe. 3/21; patient presents for follow-up. He did not come in with a dressing on his wound. He states he has been using gentamicin ointment daily. He has a Pegasys insert to his tennis shoe for offloading. He currently denies signs of infection. 3/28; patient presents for follow-up. He reports using gentamicin ointment to the wound bed. He has no issues or complaints today. Electronic Signature(s) Signed: 02/17/2022 3:12:23 PM By: Geralyn Corwin DO Entered By: Geralyn Corwin on 02/17/2022 13:19:52 -------------------------------------------------------------------------------- Physical Exam Details Patient Name: Date of Service: [name], [name]  02/17/2022 11:15 A M Medical Record Number: [id number] Patient Account Number: [id number] Date of Birth/Sex: Treating RN: Nov 14, 1971 (51 y.o. M) Primary Care Provider:  [name], [name] 02/17/2022 Medical Record Number: [id number] Patient  Account Number: [id number] Date of Birth/Sex: Treating RN: [birth year] (51 y.o. Harlon Flor, Millard.Loa Primary Care Provider: Lance Bosch Other Clinician: Referring Provider: Treating Provider/Extender: Carlyle Lipa in Treatment: 8 Diagnosis Coding ICD-10 Codes Code Description [phone] Non-pressure chronic ulcer of other part of left foot with fat layer exposed E11.621 Type 2 diabetes mellitus with foot ulcer E11.42 Type 2 diabetes mellitus with diabetic polyneuropathy Z89.511 Acquired absence of right leg below knee L02.415 Cutaneous abscess of right lower limb Facility Procedures CPT4 Code: 62952841 Description: 99213 - WOUND CARE VISIT-LEV 3 EST PT Modifier: Quantity: 1 Physician Procedures : CPT4 Code Description Modifier [id number] 99213 - WC PHYS LEVEL 3 - EST PT ICD-10 Diagnosis Description L97.522 Non-pressure chronic ulcer of other part of left foot with fat layer exposed E11.621 Type 2 diabetes mellitus with foot ulcer E11.42 Type 2  diabetes mellitus with diabetic polyneuropathy Z89.511 Acquired absence of right leg below knee Quantity: 1 Electronic Signature(s) Signed: 02/17/2022 3:12:23 PM By: Geralyn Corwin DO Entered By: Geralyn Corwin on 02/17/2022 13:22:03  Lance Bosch Other Clinician: Referring Provider: Treating Provider/Extender: Carlyle Lipa in Treatment: 20 Constitutional respirations regular, non-labored and within target range for patient.. Cardiovascular 2+ dorsalis pedis/posterior tibialis pulses. Psychiatric pleasant and cooperative. Notes Left foot: There is callus to the previous wound site. No open wounds noted. No signs of surrounding infection. Electronic Signature(s) Signed: 02/17/2022 3:12:23 PM By: Geralyn Corwin DO Entered By: Geralyn Corwin on 02/17/2022 13:20:29 -------------------------------------------------------------------------------- Physician Orders Details Patient Name: Date of Service: Bertram Millard ND, [name] 02/17/2022 11:15 A M Medical Record Number: [id number] Patient Account Number: [id number] Date of Birth/Sex: Treating RN: Apr 03, 1971 (51 y.o. [name] Primary Care Provider: Lance Bosch Other Clinician: Referring Provider: Treating Provider/Extender: Carlyle Lipa in Treatment: 59 Verbal / Phone Orders: No Diagnosis Coding Discharge From [facility] Discharge from [facility] Care Center - apply urea cream or lotion to callous area for protection. closely monitor feet daily. Electronic Signature(s) Signed: 02/17/2022 3:12:23 PM By: Geralyn Corwin DO Entered By: Geralyn Corwin on 02/17/2022 13:20:53 -------------------------------------------------------------------------------- Problem List Details Patient Name: Date of Service: Bertram Millard ND, [name] 02/17/2022 11:15 A M Medical Record Number: [id number] Patient Account Number: [id number] Date of Birth/Sex: Treating RN: 16-Dec-1970 (51 y.o. M) Primary Care Provider: Lance Bosch Other Clinician: Referring  Provider: Treating Provider/Extender: Carlyle Lipa in Treatment: 72 Active Problems ICD-10 Encounter Code Description Active Date MDM Diagnosis L97.522 Non-pressure chronic ulcer of other part of left foot with fat layer exposed 08/16/2020 No Yes E11.621 Type 2 diabetes mellitus with foot ulcer 08/16/2020 No Yes E11.42 Type 2 diabetes mellitus with diabetic polyneuropathy 08/16/2020 No Yes Z89.511 Acquired absence of right leg below knee 08/16/2020 No Yes L02.415 Cutaneous abscess of right lower limb 01/27/2022 No Yes Inactive Problems Resolved Problems ICD-10 Code Description Active Date Resolved Date S81.801D Unspecified open wound, right lower leg, subsequent encounter 04/08/2021 04/08/2021 Electronic Signature(s) Signed: 02/17/2022 3:12:23 PM By: Geralyn Corwin DO Entered By: Geralyn Corwin on 02/17/2022 13:10:15 -------------------------------------------------------------------------------- Progress Note Details Patient Name: Date of Service: [name], [name] 02/17/2022 11:15 A M Medical Record Number: [id number] Patient Account Number: [id number] Date of Birth/Sex: Treating RN: 14-Dec-1970 (51 y.o. M) Primary Care Provider: Lance Bosch Other Clinician: Referring Provider: Treating Provider/Extender: Carlyle Lipa in Treatment: 84 Subjective Chief Complaint Information obtained from Patient patient is here for a review of the wound on his plantar left first metatarsal head and back of his right leg History of Present Illness (HPI) 04/21/18 ADMISSION This is a 51 year old man who is a type II diabetic. In spite of fact his hemoglobin A1c is actually quite good 5.73 months ago he is at a really difficult time over the last 6-7 months. He developed a rapidly progressive infection in the right foot in November 2018 associated with osteomyelitis and necrotizing fasciitis. 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 The stump  required and a revision on 11/24/17. The stump revision was advertised as being secondary to falls. I'm not sure the progressive history here however this area is actually closed over. The patient tells me over the same timeframe he has had wounds on the plantar aspect of his left foot. In the ED saw Dr. Lajoyce Corners in April of this year. 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 It is really not clear that this patient is been dressing this with anything. He came in with the clinic without any specific dressing on  [name], [name] 02/17/2022 Medical Record Number: [id number] Patient  Account Number: [id number] Date of Birth/Sex: Treating RN: [birth year] (51 y.o. Harlon Flor, Millard.Loa Primary Care Provider: Lance Bosch Other Clinician: Referring Provider: Treating Provider/Extender: Carlyle Lipa in Treatment: 8 Diagnosis Coding ICD-10 Codes Code Description [phone] Non-pressure chronic ulcer of other part of left foot with fat layer exposed E11.621 Type 2 diabetes mellitus with foot ulcer E11.42 Type 2 diabetes mellitus with diabetic polyneuropathy Z89.511 Acquired absence of right leg below knee L02.415 Cutaneous abscess of right lower limb Facility Procedures CPT4 Code: 62952841 Description: 99213 - WOUND CARE VISIT-LEV 3 EST PT Modifier: Quantity: 1 Physician Procedures : CPT4 Code Description Modifier [id number] 99213 - WC PHYS LEVEL 3 - EST PT ICD-10 Diagnosis Description L97.522 Non-pressure chronic ulcer of other part of left foot with fat layer exposed E11.621 Type 2 diabetes mellitus with foot ulcer E11.42 Type 2  diabetes mellitus with diabetic polyneuropathy Z89.511 Acquired absence of right leg below knee Quantity: 1 Electronic Signature(s) Signed: 02/17/2022 3:12:23 PM By: Geralyn Corwin DO Entered By: Geralyn Corwin on 02/17/2022 13:22:03  HRITHIK, BOSCHEE ([id number]) Visit Report for 02/17/2022 Chief Complaint Document Details Patient Name: Date of Service: Harriet Pho 02/17/2022 11:15 A M Medical Record Number: [id number] Patient Account Number: [id number] Date of Birth/Sex: Treating RN: 12-27-1970 (51 y.o. M) Primary Care Provider: Lance Bosch Other Clinician: Referring Provider: Treating Provider/Extender: Carlyle Lipa in Treatment: 51 Information Obtained from: Patient Chief Complaint patient is here for a review of the wound on his plantar left first metatarsal head and back of his right leg Electronic Signature(s) Signed: 02/17/2022 3:12:23 PM By: Geralyn Corwin DO Entered By: Geralyn Corwin on 02/17/2022 13:14:47 -------------------------------------------------------------------------------- HPI Details Patient Name: Date of Service: [name], [name] 02/17/2022 11:15 A M Medical Record Number: [id number] Patient Account Number: [id number] Date of Birth/Sex: Treating RN: [birth year] (51 y.o. M) Primary Care Provider: Lance Bosch Other Clinician: Referring Provider: Treating Provider/Extender: Carlyle Lipa in Treatment: 47 History of Present Illness HPI Description: 04/21/18 ADMISSION This is a 51 year old man who is a type II diabetic. In spite of fact his hemoglobin A1c is actually quite good 5.73 months ago he is at a really difficult time over the last 6-7 months. He developed a rapidly progressive infection in the right foot in November 2018 associated with osteomyelitis and necrotizing fasciitis. 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 The stump required and a revision on 11/24/17. The stump revision was advertised as being secondary to falls. I'm not sure the progressive history here however this area is actually closed over. The patient tells me over the same timeframe he has had wounds on the plantar aspect of his left foot. In  the ED saw Dr. Lajoyce Corners in April of this year. 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 It is really not clear that this patient is been dressing this with anything. He came in with the clinic without any specific dressing on the wound areas using his own tennis shoe. He tells me that he only ambulates of course to do a pivot transfer. He does not have his prosthesis for the right leg as of yet and he blames Medicaid for this. He does however use the foot to push himself along in his wheelchair at home. The patient has not had formal arterial studies. ABI in our clinic on the left was 1.13. Patient's past medical history includes type 2 diabetes, fracture of the right fibula. I see that he was treated for abscesses on his right buttock and chest in 2016. I did not look at the microbiology of this. 04/28/18; patient comes back in the clinic today with the wound pretty much the same as when he came in here last week. Small opening lots of undermining relatively. He tells [place] that nothing is really been on this for 3 days in spite of the fact that we gave him enough to dress this easily especially such a small wound. He says he lives with his mother, she is not capable of assisting with this he is changing the dressings himself. 05/05/18; much better-looking wound today. Smaller. There is some undermining medially however that's only perhaps 2 mm. No surrounding erythema. We've been using silver collagen 05/12/18; small wound on the first metatarsal head. No undermining no surrounding erythema. We've been using silver collagen he has home health coming out to change 05/20/18 the wound on the first metatarsal head looks better. Covered in surface debris/callus nonviable tissue. Required debridement but  [name], [name] 02/17/2022 Medical Record Number: [id number] Patient  Account Number: [id number] Date of Birth/Sex: Treating RN: [birth year] (51 y.o. Harlon Flor, Millard.Loa Primary Care Provider: Lance Bosch Other Clinician: Referring Provider: Treating Provider/Extender: Carlyle Lipa in Treatment: 8 Diagnosis Coding ICD-10 Codes Code Description [phone] Non-pressure chronic ulcer of other part of left foot with fat layer exposed E11.621 Type 2 diabetes mellitus with foot ulcer E11.42 Type 2 diabetes mellitus with diabetic polyneuropathy Z89.511 Acquired absence of right leg below knee L02.415 Cutaneous abscess of right lower limb Facility Procedures CPT4 Code: 62952841 Description: 99213 - WOUND CARE VISIT-LEV 3 EST PT Modifier: Quantity: 1 Physician Procedures : CPT4 Code Description Modifier [id number] 99213 - WC PHYS LEVEL 3 - EST PT ICD-10 Diagnosis Description L97.522 Non-pressure chronic ulcer of other part of left foot with fat layer exposed E11.621 Type 2 diabetes mellitus with foot ulcer E11.42 Type 2  diabetes mellitus with diabetic polyneuropathy Z89.511 Acquired absence of right leg below knee Quantity: 1 Electronic Signature(s) Signed: 02/17/2022 3:12:23 PM By: Geralyn Corwin DO Entered By: Geralyn Corwin on 02/17/2022 13:22:03  [name], [name] 02/17/2022 Medical Record Number: [id number] Patient  Account Number: [id number] Date of Birth/Sex: Treating RN: [birth year] (51 y.o. Harlon Flor, Millard.Loa Primary Care Provider: Lance Bosch Other Clinician: Referring Provider: Treating Provider/Extender: Carlyle Lipa in Treatment: 8 Diagnosis Coding ICD-10 Codes Code Description [phone] Non-pressure chronic ulcer of other part of left foot with fat layer exposed E11.621 Type 2 diabetes mellitus with foot ulcer E11.42 Type 2 diabetes mellitus with diabetic polyneuropathy Z89.511 Acquired absence of right leg below knee L02.415 Cutaneous abscess of right lower limb Facility Procedures CPT4 Code: 62952841 Description: 99213 - WOUND CARE VISIT-LEV 3 EST PT Modifier: Quantity: 1 Physician Procedures : CPT4 Code Description Modifier [id number] 99213 - WC PHYS LEVEL 3 - EST PT ICD-10 Diagnosis Description L97.522 Non-pressure chronic ulcer of other part of left foot with fat layer exposed E11.621 Type 2 diabetes mellitus with foot ulcer E11.42 Type 2  diabetes mellitus with diabetic polyneuropathy Z89.511 Acquired absence of right leg below knee Quantity: 1 Electronic Signature(s) Signed: 02/17/2022 3:12:23 PM By: Geralyn Corwin DO Entered By: Geralyn Corwin on 02/17/2022 13:22:03  issues or complaints today. 11/3; patient presents for follow-up. He has no issues or complaints today. He states he does not want to follow-up with orthopedics to reevaluate the abscess in the back of his right leg. He denies signs of infection. 11/15; patient presents for follow-up. He has no issues or complaints today. He had been using silver alginate to the left first met head wound. He denies signs of infection. 11/22; patient presents for follow-up. He had Affinity placed at last clinic visit. He has no issues or complaints today. 11/29; patient presents for follow-up. He is in good spirits today. He has no issues or complaints today. 12/6; patient presents for follow-up. He has no issues or complaints today. 12/13; patient presents for follow-up. He denies signs of infection. He has no issues or complaints today. 12/20; patient presents for follow-up. He has no issues or complaints today. He denies signs of infection. 12/29; patient presents for follow-up. He states that a few days ago he has had reaccumulation of an abscess behind the right knee. He denies increased warmth or erythema to the area. He denies fever/chills or drainage. 1/6; patient presents for follow-up. He states he went to the [facility] emergency department to be evaluated for the abscess to his right posterior knee. He was started on clindamycin. They were unable to drain the abscess. Patient states that eventually the abscess ruptured. He reports improvement in his symptoms. He has no issues or complaints about his left foot wound. 1/17; patient presents for follow-up. He has no issues or complaints  today. 1/24; patient presents for follow-up. He has no issues or complaints today. He denies signs of infection. 1/31; patient presents for follow-up. He has been approved for Affinity however has a 20% co-pay. Patient has declined further Affinity treatments due to this. He currently denies signs of infection. 2/7; patient presents for follow-up. He has no issues or complaints today. He denies signs of infection. He has been doing Hydrofera Blue dressing changes. 2/14; patient presents for follow-up. He has no issues or complaints today. He has been using blast X with collagen to the wound bed. He currently denies any issues. 2/28; patient presents for follow-up. He denies signs of infection. He reports using blast X with collagen to the wound bed. 3/7; patient presents for follow-up. He states he has been using gentamicin ointment and urea cream with dressing changes. He reports that the back of his right knee has developed an abscess again and is draining. He states this started today. 02/03/2022: The patient came to clinic today without any dressing on his foot. He says that he took a shower yesterday and felt like he did not need a dressing and so he did not apply it. He is not offloading this foot and is wearing a regular athletic shoe. 3/21; patient presents for follow-up. He did not come in with a dressing on his wound. He states he has been using gentamicin ointment daily. He has a Pegasys insert to his tennis shoe for offloading. He currently denies signs of infection. 3/28; patient presents for follow-up. He reports using gentamicin ointment to the wound bed. He has no issues or complaints today. Electronic Signature(s) Signed: 02/17/2022 3:12:23 PM By: Geralyn Corwin DO Entered By: Geralyn Corwin on 02/17/2022 13:19:52 -------------------------------------------------------------------------------- Physical Exam Details Patient Name: Date of Service: [name], [name]  02/17/2022 11:15 A M Medical Record Number: [id number] Patient Account Number: [id number] Date of Birth/Sex: Treating RN: Nov 14, 1971 (51 y.o. M) Primary Care Provider:  Lance Bosch Other Clinician: Referring Provider: Treating Provider/Extender: Carlyle Lipa in Treatment: 20 Constitutional respirations regular, non-labored and within target range for patient.. Cardiovascular 2+ dorsalis pedis/posterior tibialis pulses. Psychiatric pleasant and cooperative. Notes Left foot: There is callus to the previous wound site. No open wounds noted. No signs of surrounding infection. Electronic Signature(s) Signed: 02/17/2022 3:12:23 PM By: Geralyn Corwin DO Entered By: Geralyn Corwin on 02/17/2022 13:20:29 -------------------------------------------------------------------------------- Physician Orders Details Patient Name: Date of Service: Bertram Millard ND, [name] 02/17/2022 11:15 A M Medical Record Number: [id number] Patient Account Number: [id number] Date of Birth/Sex: Treating RN: Apr 03, 1971 (51 y.o. [name] Primary Care Provider: Lance Bosch Other Clinician: Referring Provider: Treating Provider/Extender: Carlyle Lipa in Treatment: 59 Verbal / Phone Orders: No Diagnosis Coding [facility]charge From [facility] Discharge from [facility] - apply urea cream or lotion to callous area for protection. closely monitor feet daily. Electronic Signature(s) Signed: 02/17/2022 3:12:23 PM By: Geralyn Corwin DO Entered By: Geralyn Corwin on 02/17/2022 13:20:53 -------------------------------------------------------------------------------- Problem List Details Patient Name: Date of Service: Bertram Millard ND, [name] 02/17/2022 11:15 A M Medical Record Number: [id number] Patient Account Number: [id number] Date of Birth/Sex: Treating RN: 16-Dec-1970 (51 y.o. M) Primary Care Provider: Lance Bosch Other Clinician: Referring  Provider: Treating Provider/Extender: Carlyle Lipa in Treatment: 72 Active Problems ICD-10 Encounter Code Description Active Date MDM Diagnosis L97.522 Non-pressure chronic ulcer of other part of left foot with fat layer exposed 08/16/2020 No Yes E11.621 Type 2 diabetes mellitus with foot ulcer 08/16/2020 No Yes E11.42 Type 2 diabetes mellitus with diabetic polyneuropathy 08/16/2020 No Yes Z89.511 Acquired absence of right leg below knee 08/16/2020 No Yes L02.415 Cutaneous abscess of right lower limb 01/27/2022 No Yes Inactive Problems Resolved Problems ICD-10 Code Description Active Date Resolved Date S81.801D Unspecified open wound, right lower leg, subsequent encounter 04/08/2021 04/08/2021 Electronic Signature(s) Signed: 02/17/2022 3:12:23 PM By: Geralyn Corwin DO Entered By: Geralyn Corwin on 02/17/2022 13:10:15 -------------------------------------------------------------------------------- Progress Note Details Patient Name: Date of Service: [name], [name] 02/17/2022 11:15 A M Medical Record Number: [id number] Patient Account Number: [id number] Date of Birth/Sex: Treating RN: 14-Dec-1970 (51 y.o. M) Primary Care Provider: Lance Bosch Other Clinician: Referring Provider: Treating Provider/Extender: Carlyle Lipa in Treatment: 84 Subjective Chief Complaint Information obtained from Patient patient is here for a review of the wound on his plantar left first metatarsal head and back of his right leg History of Present Illness (HPI) 04/21/18 ADMISSION This is a 51 year old man who is a type II diabetic. In spite of fact his hemoglobin A1c is actually quite good 5.73 months ago he is at a really difficult time over the last 6-7 months. He developed a rapidly progressive infection in the right foot in November 2018 associated with osteomyelitis and necrotizing fasciitis. 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 The stump  required and a revision on 11/24/17. The stump revision was advertised as being secondary to falls. I'm not sure the progressive history here however this area is actually closed over. The patient tells me over the same timeframe he has had wounds on the plantar aspect of his left foot. In the ED saw Dr. Lajoyce Corners in April of this year. 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 It is really not clear that this patient is been dressing this with anything. He came in with the clinic without any specific dressing on

## 2022-02-24 ENCOUNTER — Encounter (HOSPITAL_BASED_OUTPATIENT_CLINIC_OR_DEPARTMENT_OTHER): Payer: Medicare Other | Admitting: Internal Medicine

## 2022-03-06 ENCOUNTER — Encounter (HOSPITAL_COMMUNITY)
Admission: RE | Admit: 2022-03-06 | Discharge: 2022-03-06 | Disposition: A | Discharge status: Home / Self Care | Payer: Medicare Other | Source: Ambulatory Visit | Attending: Nephrology | Admitting: Nephrology

## 2022-03-06 VITALS — BP 148/73 | HR 89 | Temp 97.4°F | Resp 18

## 2022-03-06 DIAGNOSIS — D649 Anemia, unspecified: Secondary | ICD-10-CM | POA: Insufficient documentation

## 2022-03-06 DIAGNOSIS — D62 Acute posthemorrhagic anemia: Secondary | ICD-10-CM | POA: Insufficient documentation

## 2022-03-06 DIAGNOSIS — N179 Acute kidney failure, unspecified: Secondary | ICD-10-CM | POA: Insufficient documentation

## 2022-03-06 LAB — IRON AND TIBC
Iron: 35 ug/dL — ABNORMAL LOW (ref 45–182)
Saturation Ratios: 12 % — ABNORMAL LOW (ref 17.9–39.5)
TIBC: 281 ug/dL (ref 250–450)
UIBC: 246 ug/dL

## 2022-03-06 LAB — POCT HEMOGLOBIN-HEMACUE: Hemoglobin: 9.7 g/dL — ABNORMAL LOW (ref 13.0–17.0)

## 2022-03-06 LAB — FERRITIN: Ferritin: 23 ng/mL — ABNORMAL LOW (ref 24–336)

## 2022-03-06 MED ORDER — EPOETIN ALFA-EPBX 10000 UNIT/ML IJ SOLN
10000.0000 [IU] | INTRAMUSCULAR | Status: DC
  Administered 2022-03-06: 10000 [IU] via SUBCUTANEOUS

## 2022-03-06 MED ORDER — EPOETIN ALFA-EPBX 10000 UNIT/ML IJ SOLN
INTRAMUSCULAR | Status: AC
  Filled 2022-03-06: qty 1

## 2022-03-11 ENCOUNTER — Ambulatory Visit (INDEPENDENT_AMBULATORY_CARE_PROVIDER_SITE_OTHER): Payer: Medicare Other | Admitting: Cardiovascular Disease

## 2022-03-11 ENCOUNTER — Encounter: Payer: Self-pay | Admitting: Cardiovascular Disease

## 2022-03-11 ENCOUNTER — Encounter (INDEPENDENT_AMBULATORY_CARE_PROVIDER_SITE_OTHER): Payer: Self-pay

## 2022-03-11 ENCOUNTER — Ambulatory Visit (INDEPENDENT_AMBULATORY_CARE_PROVIDER_SITE_OTHER): Payer: Medicare Other

## 2022-03-11 VITALS — BP 112/60 | HR 83 | Ht 77.0 in | Wt 237.0 lb

## 2022-03-11 DIAGNOSIS — R002 Palpitations: Secondary | ICD-10-CM

## 2022-03-11 NOTE — Progress Notes (Unsigned)
[id number] ZIO XT from office inventory applied to patient. ?

## 2022-03-11 NOTE — Progress Notes (Signed)
Chief Complaint  Patient presents with   New Patient (Initial Visit)    Palpitations    History of Present Illness: 51 yo male with history of anemia, depression, diabetes on insulin, HLD,  HTN, gastroparesis, tobacco abuse, traumatic injury leading to osteomyelitis and right BKA, CKD stage IV and neuropathy who is here today as a new patient for the evaluation of palpitations. He has no known cardiac issues. He takes metoprolol 25 mg po BID. He has been noticing palpitations for the past few months. This occurs every other day. It feels like his heart is racing. No associated dizziness or near syncope. No exertional chest pain. Some chest pain with deep breaths and cough.   Primary Care Physician: Lance Bosch, NP  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)    Depression    Diabetes mellitus without complication (HCC) 2019   GERD (gastroesophageal reflux disease)    Hyperlipidemia    Hypertension    Necrotizing fasciitis (HCC) 10/13/2017   Neuromuscular disorder (HCC)    neuropathy   PAD (peripheral artery disease) (HCC)    Secondary hyperparathyroidism (HCC)    Wound dehiscence 11/24/2017    Past Surgical History:  Procedure Laterality Date   AMPUTATION Right 10/13/2017   Procedure: RIGHT BELOW KNEE AMPUTATION;  Surgeon: Nadara Mustard, MD;  Location: [facility] OR;  Service: Orthopedics;  Laterality: Right;   AMPUTATION Right 11/24/2017   Procedure: AMPUTATION BELOW KNEE REVISION;  Surgeon: Nadara Mustard, MD;  Location: [facility] OR;  Service: Orthopedics;  Laterality: Right;   BIOPSY  09/02/2021   Procedure: BIOPSY;  Surgeon: Imogene Burn, MD;  Location[facility] ENDOSCOPY;  Service: Gastroenterology;;   COLONOSCOPY  05/11/2019   ESOPHAGOGASTRODUODENOSCOPY (EGD) WITH PROPOFOL N/A 09/02/2021   Procedure:  ESOPHAGOGASTRODUODENOSCOPY (EGD) WITH PROPOFOL;  Surgeon: Imogene Burn, MD;  Location: [facility] ENDOSCOPY;  Service: Gastroenterology;  Laterality: N/A;   NO PAST SURGERIES     POLYPECTOMY     WISDOM TOOTH EXTRACTION      Current Outpatient Medications  Medication Sig Dispense Refill   AGAMATRIX ULTRA-THIN LANCETS MISC Check blood sugars before meals twice daily 100 each 11   amLODipine (NORVASC) 10 MG tablet Take 10 mg by mouth daily.     ARIPiprazole (ABILIFY) 20 MG tablet Take 20 mg by mouth daily.     atorvastatin (LIPITOR) 40 MG tablet Take 40 mg by mouth daily.     baclofen (LIORESAL) 10 MG tablet 1/2 tab twice a day (Patient taking differently: Take 20 mg by mouth 2 (two) times daily as needed for muscle spasms.) 30 each 6   desvenlafaxine (PRISTIQ) 100 MG 24 hr tablet Take 100 mg by mouth daily.     FARXIGA 10 MG TABS tablet Take 10 mg by mouth daily.     FLUoxetine (PROZAC) 40 MG capsule Take 40 mg by mouth daily.     fluticasone (FLONASE) 50 MCG/ACT nasal spray Place 2 sprays into both nostrils daily as needed for allergies or rhinitis.      furosemide (LASIX) 40 MG tablet Take 40 mg by mouth daily.     gabapentin (NEURONTIN) 300 MG capsule 1 cap by mouth in morning and midday, 2 caps by mouth at bedtime (Patient taking differently: Take 300-600 mg by mouth See admin  instructions. Take one capsule (300 mg) by mouth in the morning and afternoon, take two capsules (600 mg) at bedtime) 120 capsule 11   glipiZIDE (GLUCOTROL) 5 MG tablet 1/2 tab by mouth twice daily with meal (Patient taking differently: Take 5 mg by mouth daily with supper.) 15 tablet 11   glucose blood (AGAMATRIX PRESTO TEST) test strip Check blood sugars twice daily before meals 100 each 12   glucose blood test strip 1 each by Other route as needed for other. Use as instructed     metoCLOPramide (REGLAN) 10 MG tablet Take 1 tablet (10 mg total) by mouth 2 (two) times daily. 60 tablet 1   metoprolol tartrate (LOPRESSOR) 25  MG tablet Take 25 mg by mouth 2 (two) times daily.     ondansetron (ZOFRAN) 4 MG tablet Take 1 tablet (4 mg total) by mouth 3 (three) times daily as needed for nausea or vomiting. 30 tablet 0   pantoprazole (PROTONIX) 40 MG tablet Take 1 tablet (40 mg total) by mouth 2 (two) times daily. 60 tablet 2   sucralfate (CARAFATE) 1 GM/10ML suspension Take 10 mLs (1 g total) by mouth 4 (four) times daily -  with meals and at bedtime. 420 mL 0   GI Cocktail (alum & mag hydroxide-simethicone/lidocaine)oral mixture Take 15 mLs by mouth 3 (three) times daily as needed. (Patient not taking: Reported on 03/11/2022) 180 mL 1   TRULICITY 1.5 MG/0.5ML SOPN Inject 1.5 mg into the skin once a week. Friday's (Patient not taking: Reported on 03/11/2022)     No current facility-administered medications for this visit.    Allergies  Allergen Reactions   Iron Nausea And Vomiting    With the iron tablets   Adhesive [Tape] Rash    Rash at site of tape--okay to use paper tape    Social History   Socioeconomic History   Marital status: Divorced    Spouse name: Not on file   Number of children: 1   Years of education: 12   Highest education level: Associate degree: occupational, Scientist, product/process development, or vocational program  Occupational History   Occupation: unemployed  Tobacco Use   Smoking status: Every Day    Packs/day: 0.50    Years: 35.00    Pack years: 17.50    Types: Cigarettes   Smokeless tobacco: Never   Tobacco comments:    stopped, but back on 1 cigarette daily.  Vaping Use   Vaping Use: Never used  Substance and Sexual Activity   Alcohol use: No   Drug use: No   Sexual activity: Not on file  Other Topics Concern   Not on file  Social History Narrative   Originally from  [place].   Is living with his mother, who essentially supports him now.   Social Determinants of Health   Financial Resource Strain: Not on file  Food Insecurity: Not on file  Transportation Needs: Not on file  Physical  Activity: Not on file  Stress: Not on file  Social Connections: Not on file  Intimate Partner Violence: Not on file    Family History  Problem Relation Age of Onset   Diabetes Mellitus II Mother    Depression Mother    Colon polyps Mother    Heart disease Father        CABG x 4.  04/2017   Colon cancer Neg Hx    Esophageal cancer Neg Hx    Liver cancer Neg Hx    Pancreatic cancer Neg Hx  Rectal cancer Neg Hx    Stomach cancer Neg Hx     Review of Systems:  As stated in the HPI and otherwise negative.   BP 112/60   Pulse 83   Ht 6\' 5"  (1.956 m)   Wt 237 lb (107.5 kg)   SpO2 100%   BMI 28.10 kg/m   Physical Examination: General: Well developed, well nourished, NAD  HEENT: OP clear, mucus membranes moist  SKIN: warm, dry. No rashes. Neuro: No focal deficits  Musculoskeletal: Muscle strength 5/5 all ext  Psychiatric: Mood and affect normal  Neck: No JVD, no carotid bruits, no thyromegaly, no lymphadenopathy.  Lungs:Clear bilaterally, no wheezes, rhonci, crackles Cardiovascular: Regular rate and rhythm. No murmurs, gallops or rubs. Abdomen:Soft. Bowel sounds present. Non-tender.  Extremities: No lower extremity edema. Pulses are 2 + in the bilateral DP/PT.  EKG:  EKG is ordered today. The ekg ordered today demonstrates sinus, early repolarization  Recent Labs: 09/02/2021: ALT 9; Magnesium 1.7 12/12/2021: BUN 30; Creatinine, Ser 2.88; Platelets 251; Potassium 4.2; Sodium 133 03/06/2022: Hemoglobin 9.7   Lipid Panel No results found for: CHOL, TRIG, HDL, CHOLHDL, VLDL, LDLCALC, LDLDIRECT   Wt Readings from Last 3 Encounters:  03/11/22 237 lb (107.5 kg)  12/12/21 234 lb (106.1 kg)  09/09/21 231 lb 8 oz (105 kg)    Assessment and Plan:   1. Palpitations: Will arrange a cardiac monitor. Will arrange an echo to assess LVEF and exclude structural heart disease.   Labs/ tests ordered today include:   Orders Placed This Encounter  Procedures   LONG TERM MONITOR  (3-14 DAYS)   EKG 12-Lead   ECHOCARDIOGRAM COMPLETE   Disposition:   F/U with me in 4-8 weeks.    Signed, Verne Carrow, MD 03/11/2022 4:21 PM    [facility] [address] [place], [place], [place]  [address] Phone: [phone]; Fax: [phone]

## 2022-03-11 NOTE — Patient Instructions (Signed)
Medication Instructions:  ?No changes ?*If you need a refill on your cardiac medications before your next appointment, please call your pharmacy* ? ? ?Lab Work: ?none ?If you have labs (blood work) drawn today and your tests are completely normal, you will receive your results only by: ?MyChart Message (if you have MyChart) OR ?A paper copy in the mail ?If you have any lab test that is abnormal or we need to change your treatment, we will call you to review the results. ? ? ?Testing/Procedures: ?Your physician has requested that you have an echocardiogram. Echocardiography is a painless test that uses sound waves to create images of your heart. It provides your doctor with information about the size and shape of your heart and how well your heart?s chambers and valves are working. This procedure takes approximately one hour. There are no restrictions for this procedure. ? ?14 day Heart monitor - to be applied today ? ? ?Follow-Up: ?As needed based on results ? ? ?Important Information About Sugar ? ? ? ? ?  ?

## 2022-03-19 ENCOUNTER — Encounter (HOSPITAL_COMMUNITY)
Admission: RE | Admit: 2022-03-19 | Discharge: 2022-03-19 | Disposition: A | Discharge status: Home / Self Care | Payer: Medicare Other | Source: Ambulatory Visit | Attending: Nephrology | Admitting: Nephrology

## 2022-03-19 VITALS — BP 138/64 | HR 90 | Temp 97.4°F | Resp 18

## 2022-03-19 DIAGNOSIS — N179 Acute kidney failure, unspecified: Secondary | ICD-10-CM | POA: Diagnosis not present

## 2022-03-19 DIAGNOSIS — D649 Anemia, unspecified: Secondary | ICD-10-CM

## 2022-03-19 DIAGNOSIS — D62 Acute posthemorrhagic anemia: Secondary | ICD-10-CM

## 2022-03-19 LAB — POCT HEMOGLOBIN-HEMACUE: Hemoglobin: 9.3 g/dL — ABNORMAL LOW (ref 13.0–17.0)

## 2022-03-19 MED ORDER — EPOETIN ALFA-EPBX 10000 UNIT/ML IJ SOLN
INTRAMUSCULAR | Status: DC
Start: 2022-03-19 — End: 2022-03-20
  Filled 2022-03-19: qty 1

## 2022-03-19 MED ORDER — EPOETIN ALFA-EPBX 10000 UNIT/ML IJ SOLN
10000.0000 [IU] | INTRAMUSCULAR | Status: DC
  Administered 2022-03-19: 10000 [IU] via SUBCUTANEOUS

## 2022-03-20 ENCOUNTER — Encounter (HOSPITAL_COMMUNITY): Payer: Medicare Other

## 2022-03-30 ENCOUNTER — Encounter (HOSPITAL_COMMUNITY): Payer: Self-pay

## 2022-03-30 IMAGING — DX DG FOOT COMPLETE 3+V*L*
3 series · 3 of 3 positions shown · non-contrast
Comparison: 07/25/2018

CLINICAL DATA: Diabetic foot ulcer, no known injury, initial
encounter

EXAM:
LEFT FOOT - COMPLETE 3+ VIEW

[foot ap]
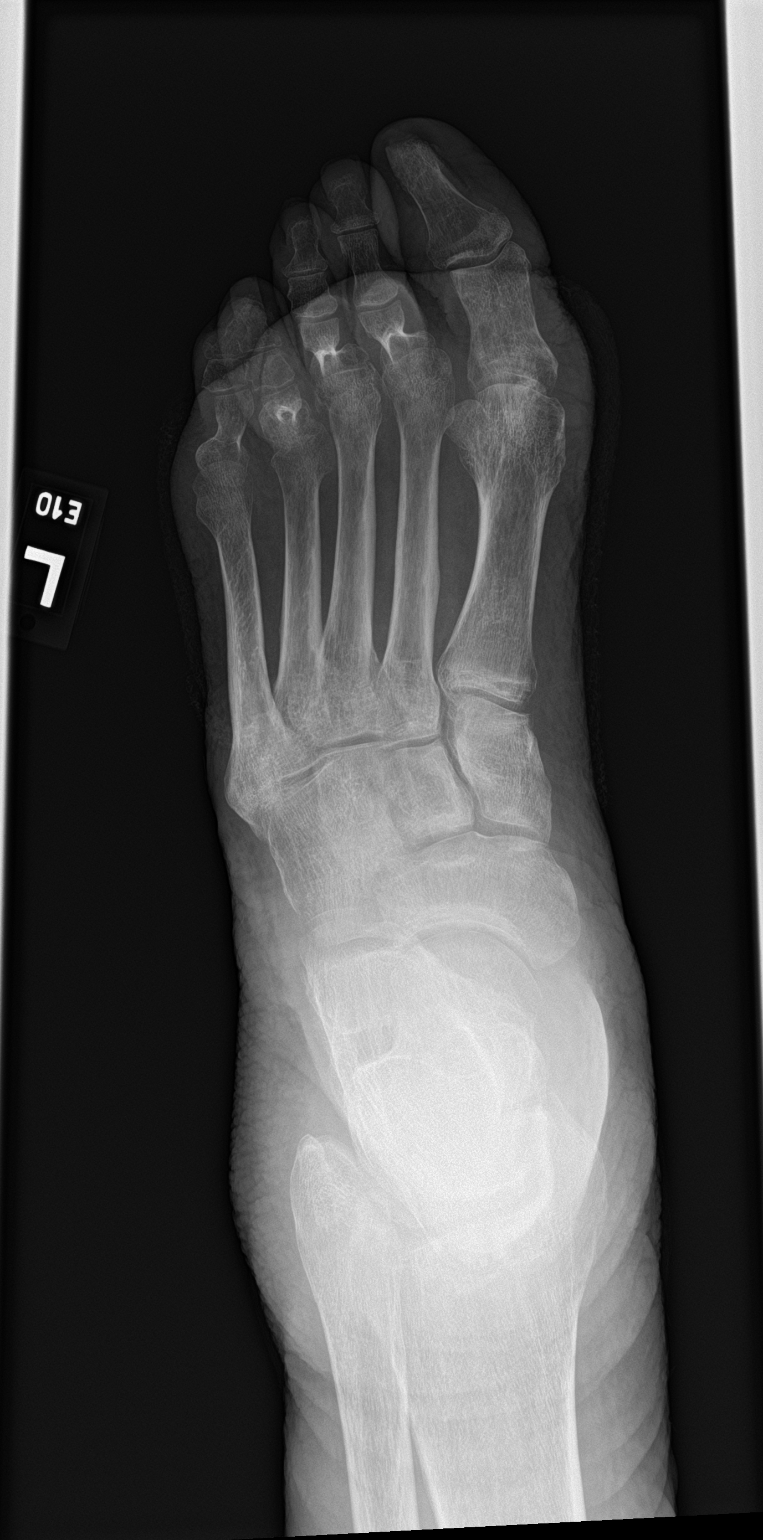

[foot obl]
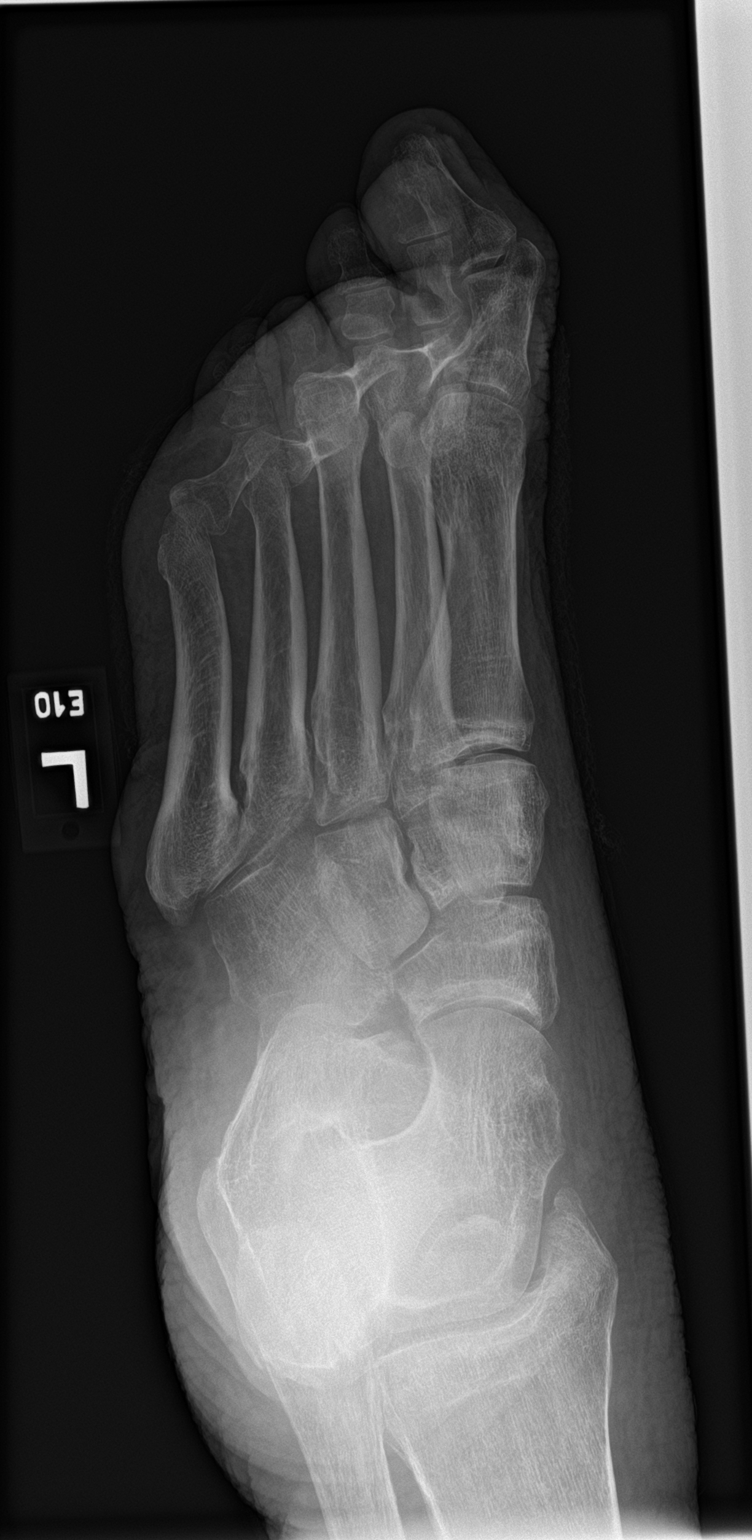

[foot lat]
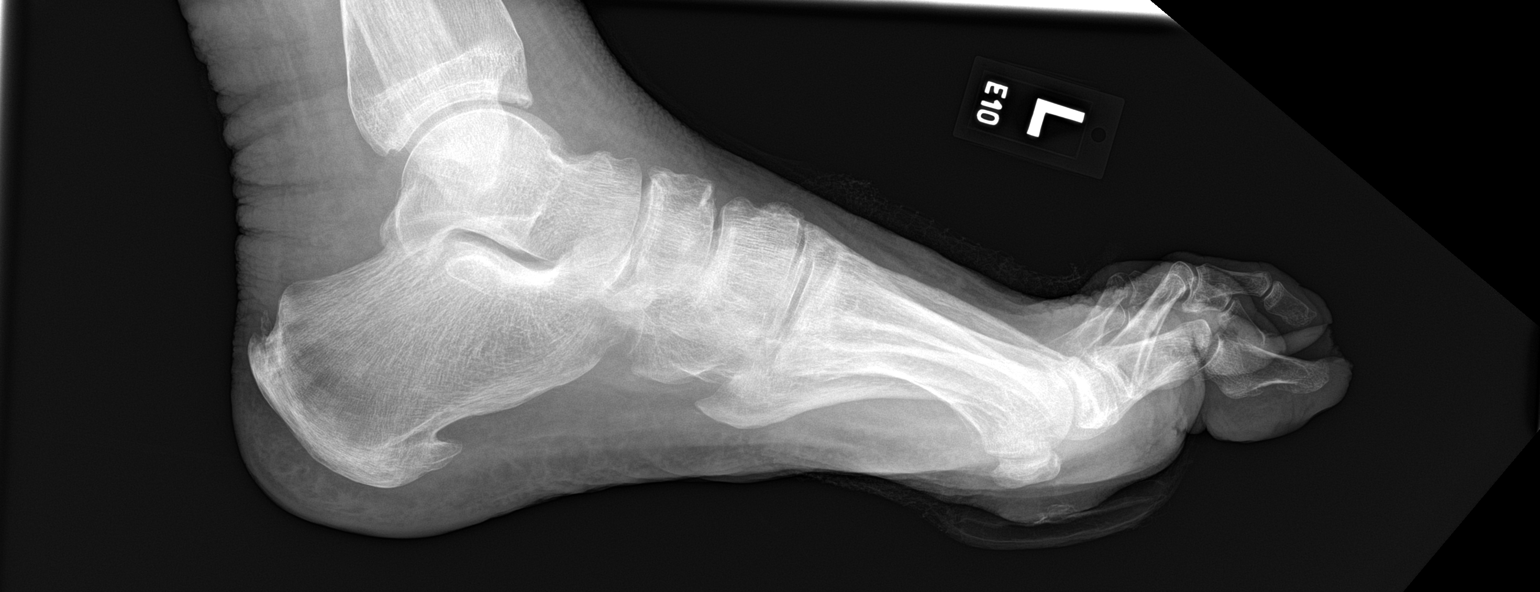

[3 of 3 positions shown; findings below may reference images not displayed]

FINDINGS: Mild hallux valgus deformity is noted. No acute fracture or
dislocation is seen. Soft tissue prominence is noted along the
plantar surface near the metatarsal heads consistent with the given
clinical history. No bony erosive changes to suggest osteomyelitis
are noted. Calcaneal spurring and tarsal degenerative changes are
noted.
IMPRESSION: Soft tissue wound along the plantar aspect of the foot without
changes to suggest osteomyelitis.

## 2022-03-31 ENCOUNTER — Ambulatory Visit (HOSPITAL_COMMUNITY): Payer: Medicare Other | Attending: Cardiology

## 2022-03-31 DIAGNOSIS — E785 Hyperlipidemia, unspecified: Secondary | ICD-10-CM | POA: Diagnosis not present

## 2022-03-31 DIAGNOSIS — I129 Hypertensive chronic kidney disease with stage 1 through stage 4 chronic kidney disease, or unspecified chronic kidney disease: Secondary | ICD-10-CM | POA: Insufficient documentation

## 2022-03-31 DIAGNOSIS — N189 Chronic kidney disease, unspecified: Secondary | ICD-10-CM | POA: Insufficient documentation

## 2022-03-31 DIAGNOSIS — R002 Palpitations: Secondary | ICD-10-CM

## 2022-03-31 LAB — ECHOCARDIOGRAM COMPLETE
Area-P 1/2: 4.26 cm2
S' Lateral: 2.7 cm

## 2022-04-02 ENCOUNTER — Encounter (HOSPITAL_COMMUNITY): Payer: Medicare Other

## 2022-04-02 ENCOUNTER — Encounter (HOSPITAL_COMMUNITY)
Admission: RE | Admit: 2022-04-02 | Discharge: 2022-04-02 | Disposition: A | Discharge status: Home / Self Care | Payer: Medicare Other | Source: Ambulatory Visit | Attending: Nephrology | Admitting: Nephrology

## 2022-04-02 VITALS — BP 116/82 | HR 74 | Temp 97.5°F | Resp 18

## 2022-04-02 DIAGNOSIS — N179 Acute kidney failure, unspecified: Secondary | ICD-10-CM | POA: Diagnosis not present

## 2022-04-02 DIAGNOSIS — D62 Acute posthemorrhagic anemia: Secondary | ICD-10-CM | POA: Diagnosis present

## 2022-04-02 DIAGNOSIS — D649 Anemia, unspecified: Secondary | ICD-10-CM | POA: Insufficient documentation

## 2022-04-02 LAB — POCT HEMOGLOBIN-HEMACUE: Hemoglobin: 9.2 g/dL — ABNORMAL LOW (ref 13.0–17.0)

## 2022-04-02 LAB — IRON AND TIBC
Iron: 45 ug/dL (ref 45–182)
Saturation Ratios: 14 % — ABNORMAL LOW (ref 17.9–39.5)
TIBC: 311 ug/dL (ref 250–450)
UIBC: 266 ug/dL

## 2022-04-02 LAB — FERRITIN: Ferritin: 17 ng/mL — ABNORMAL LOW (ref 24–336)

## 2022-04-02 MED ORDER — EPOETIN ALFA-EPBX 10000 UNIT/ML IJ SOLN: 10000.0000 [IU] | INTRAMUSCULAR | Status: DC

## 2022-04-02 MED ORDER — EPOETIN ALFA-EPBX 10000 UNIT/ML IJ SOLN
INTRAMUSCULAR | Status: AC
  Administered 2022-04-02: 10000 [IU] via SUBCUTANEOUS
  Filled 2022-04-02: qty 1

## 2022-04-03 ENCOUNTER — Telehealth: Payer: Self-pay | Admitting: Cardiovascular Disease

## 2022-04-03 DIAGNOSIS — I471 Supraventricular tachycardia, unspecified: Secondary | ICD-10-CM

## 2022-04-03 DIAGNOSIS — R002 Palpitations: Secondary | ICD-10-CM

## 2022-04-03 NOTE — Telephone Encounter (Signed)
Lm to call back ./cy 

## 2022-04-03 NOTE — Telephone Encounter (Signed)
Pt returning nurses call. Call transferred 

## 2022-04-03 NOTE — Telephone Encounter (Signed)
-----   Message from Burnell Blanks, MD sent at 03/31/2022  5:12 PM EDT ----- ?He has SVT on his monitor. I would like to have him increase his metoprolol to 50 mg BID. If he continues to have palpitations, we can refer to EP to discuss an ablation.  ?

## 2022-04-03 NOTE — Telephone Encounter (Signed)
Called patient w results. ?About one month ago his primary care provider increased his metoprolol to 50 mg BID.  He does not feel palpitations but feels when his heart races and when it stops.  He said each episodes lasts about 2-3 min at a time. ? ?I updated his medication list. We discussed referral to EP and is in agreement with this plan.  I adv that he will receive a call to schedule this appointment.   ?

## 2022-04-16 ENCOUNTER — Encounter (HOSPITAL_COMMUNITY)
Admission: RE | Admit: 2022-04-16 | Discharge: 2022-04-16 | Disposition: A | Discharge status: Home / Self Care | Payer: Medicare Other | Source: Ambulatory Visit | Attending: Nephrology | Admitting: Nephrology

## 2022-04-16 VITALS — BP 126/77 | HR 77

## 2022-04-16 DIAGNOSIS — N179 Acute kidney failure, unspecified: Secondary | ICD-10-CM | POA: Diagnosis not present

## 2022-04-16 DIAGNOSIS — D62 Acute posthemorrhagic anemia: Secondary | ICD-10-CM

## 2022-04-16 DIAGNOSIS — D649 Anemia, unspecified: Secondary | ICD-10-CM

## 2022-04-16 MED ORDER — EPOETIN ALFA-EPBX 10000 UNIT/ML IJ SOLN
INTRAMUSCULAR | Status: AC
  Filled 2022-04-16: qty 1

## 2022-04-16 MED ORDER — EPOETIN ALFA-EPBX 10000 UNIT/ML IJ SOLN
10000.0000 [IU] | INTRAMUSCULAR | Status: DC
  Administered 2022-04-16: 10000 [IU] via SUBCUTANEOUS

## 2022-04-17 NOTE — Progress Notes (Signed)
CREEDON, DANIELSKI ([id number]) Visit Report for 04/21/2022 Allergy List Details Patient Name: Date of Service: [name] [name] 04/21/2022 1:15 PM Medical Record Number: [id number] Patient Account Number: [id number] Date of Birth/Sex: Treating RN: 11-02-1971 (51 y.o. [name] Primary Care Dessirae Scarola: [name] Other Clinician: Referring Kyesha Balla: Treating Christain Niznik/Extender: Delano Metz in Treatment: 0 Allergies Active Allergies adhesive tape Reaction: rash Severity: Mild Fer-Iron Allergy Notes Electronic Signature(s) Signed: 04/21/2022 4:27:43 PM By: Lorrin Jackson Previous Signature: 04/17/2022 10:10:47 AM Version By: Lorrin Jackson Entered By: Lorrin Jackson on 04/21/2022 13:49:22 -------------------------------------------------------------------------------- Arrival Information Details Patient Name: Date of Service: [name] [name], [name] 04/21/2022 1:15 PM Medical Record Number: [id number] Patient Account Number: [id number] Date of Birth/Sex: Treating RN: August 09, 1971 (51 y.o. [name] Primary Care Matilde Pottenger: [name] Other Clinician: Referring Colbert Curenton: Treating Raja Caputi/Extender: Delano Metz in Treatment: 0 Visit Information Patient Arrived: Ambulatory Arrival Time: 13:42 Transfer Assistance: None Patient Identification Verified: Yes Secondary Verification Process Completed: Yes Patient Requires Transmission-Based Precautions: No Patient Has Alerts: No History Since Last Visit Added or deleted any medications: No Any new allergies or adverse reactions: No Had a fall or experienced change in activities of daily living that may affect risk of falls: No Signs or symptoms of abuse/neglect since last visito No Hospitalized since last visit: No Implantable device outside of the clinic excluding cellular tissue based products placed in the center since last visit: No Has Dressing in Place  as Prescribed: Yes Pain Present Now: Yes Electronic Signature(s) Signed: 04/21/2022 4:27:43 PM By: Lorrin Jackson Entered By: Lorrin Jackson on 04/21/2022 13:45:36 -------------------------------------------------------------------------------- Clinic Level of Care Assessment Details Patient Name: Date of Service: [name] [name] 04/21/2022 1:15 PM Medical Record Number: [id number] Patient Account Number: [id number] Date of Birth/Sex: Treating RN: January 12, 1971 (51 y.o. [name] Primary Care Sun Kihn: [name] Other Clinician: Referring Lysbeth Dicola: Treating Jadynn Epping/Extender: Delano Metz in Treatment: 0 Clinic Level of Care Assessment Items TOOL 2 Quantity Score X- 1 0 Use when only an EandM is performed on the INITIAL visit ASSESSMENTS - Nursing Assessment / Reassessment X- 1 20 General Physical Exam (combine w/ comprehensive assessment (listed just below) when performed on new pt. evals) X- 1 25 Comprehensive Assessment (HX, ROS, Risk Assessments, Wounds Hx, etc.) ASSESSMENTS - Wound and Skin A ssessment / Reassessment X - Simple Wound Assessment / Reassessment - one wound 1 5 '[]'$  - 0 Complex Wound Assessment / Reassessment - multiple wounds '[]'$  - 0 Dermatologic / Skin Assessment (not related to wound area) ASSESSMENTS - Ostomy and/or Continence Assessment and Care '[]'$  - 0 Incontinence Assessment and Management '[]'$  - 0 Ostomy Care Assessment and Management (repouching, etc.) PROCESS - Coordination of Care '[]'$  - 0 Simple Patient / Family Education for ongoing care X- 1 20 Complex (extensive) Patient / Family Education for ongoing care X- 1 10 Staff obtains Programmer, systems, Records, T Results / Process Orders est '[]'$  - 0 Staff telephones HHA, Nursing Homes / Clarify orders / etc '[]'$  - 0 Routine Transfer to another Facility (non-emergent condition) '[]'$  - 0 Routine Hospital Admission (non-emergent condition) '[]'$  - 0 New Admissions / Medical laboratory scientific officer / Ordering NPWT Apligraf, etc. , '[]'$  - 0 Emergency Hospital Admission (emergent condition) '[]'$  - 0 Simple Discharge Coordination '[]'$  - 0 Complex (extensive) Discharge Coordination PROCESS - Special Needs '[]'$  - 0 Pediatric / Minor Patient Management '[]'$  - 0 Isolation Patient Management '[]'$  - 0 Hearing / Language /  Visual special needs '[]'$  - 0 Assessment of Community assistance (transportation, D/C planning, etc.) '[]'$  - 0 Additional assistance / Altered mentation '[]'$  - 0 Support Surface(s) Assessment (bed, cushion, seat, etc.) INTERVENTIONS - Wound Cleansing / Measurement '[]'$  - 0 Wound Imaging (photographs - any number of wounds) '[]'$  - 0 Wound Tracing (instead of photographs) '[]'$  - 0 Simple Wound Measurement - one wound '[]'$  - 0 Complex Wound Measurement - multiple wounds '[]'$  - 0 Simple Wound Cleansing - one wound '[]'$  - 0 Complex Wound Cleansing - multiple wounds INTERVENTIONS - Wound Dressings '[]'$  - 0 Small Wound Dressing one or multiple wounds '[]'$  - 0 Medium Wound Dressing one or multiple wounds '[]'$  - 0 Large Wound Dressing one or multiple wounds '[]'$  - 0 Application of Medications - injection INTERVENTIONS - Miscellaneous '[]'$  - 0 External ear exam '[]'$  - 0 Specimen Collection (cultures, biopsies, blood, body fluids, etc.) '[]'$  - 0 Specimen(s) / Culture(s) sent or taken to Lab for analysis '[]'$  - 0 Patient Transfer (multiple staff / Civil Service fast streamer / Similar devices) '[]'$  - 0 Simple Staple / Suture removal (25 or less) '[]'$  - 0 Complex Staple / Suture removal (26 or more) '[]'$  - 0 Hypo / Hyperglycemic Management (close monitor of Blood Glucose) '[]'$  - 0 Ankle / Brachial Index (ABI) - do not check if billed separately Has the patient been seen at the hospital within the last three years: Yes Total Score: 80 Level Of Care: New/Established - Level 3 Electronic Signature(s) Signed: 04/21/2022 4:27:43 PM By: Lorrin Jackson Entered By: Lorrin Jackson on 04/21/2022  14:26:53 -------------------------------------------------------------------------------- Encounter Discharge Information Details Patient Name: Date of Service: [name] [name], [name] 04/21/2022 1:15 PM Medical Record Number: [id number] Patient Account Number: [id number] Date of Birth/Sex: Treating RN: 02-24-1971 (51 y.o. [name] Primary Care Makayleigh Poliquin: [name] Other Clinician: Referring Lanise Mergen: Treating Jaquelynn Wanamaker/Extender: Delano Metz in Treatment: 0 Encounter Discharge Information Items Discharge Condition: Stable Ambulatory Status: Ambulatory Discharge Destination: Home Transportation: Private Auto Schedule Follow-up Appointment: No Clinical Summary of Care: Provided on 04/21/2022 Form Type Recipient Paper Patient Patient Electronic Signature(s) Signed: 04/21/2022 4:27:43 PM By: Lorrin Jackson Entered By: Lorrin Jackson on 04/21/2022 14:27:34 -------------------------------------------------------------------------------- Lower Extremity Assessment Details Patient Name: Date of Service: [name] [name], [name] 04/21/2022 1:15 PM Medical Record Number: [id number] Patient Account Number: [id number] Date of Birth/Sex: Treating RN: January 27, 1971 (51 y.o. [name] Primary Care Leeandre Nordling: [name] Other Clinician: Referring Allanah Mcfarland: Treating Yetzali Weld/Extender: Delano Metz in Treatment: 0 Edema Assessment Assessed: [name]: Yes] [name]: No] Edema: [Left: N] [Right: o] Calf Left: Right: Point of Measurement: 33 cm From Medial Instep 38.7 cm Ankle Left: Right: Point of Measurement: 10 cm From Medial Instep 24.4 cm Knee To Floor Left: Right: From Medial Instep 43 cm Vascular Assessment Pulses: Dorsalis Pedis Palpable: [Left:Yes] Electronic Signature(s) Signed: 04/21/2022 4:27:43 PM By: Lorrin Jackson Entered By: Lorrin Jackson on 04/21/2022  13:57:07 -------------------------------------------------------------------------------- Multi Wound Chart Details Patient Name: Date of Service: [name] [name], [name] 04/21/2022 1:15 PM Medical Record Number: [id number] Patient Account Number: [id number] Date of Birth/Sex: Treating RN: Jun 16, 1971 (51 y.o. M) Primary Care Quiera Diffee: [name] Other Clinician: Referring Cecelia Graciano: Treating Jarrett Albor/Extender: Delano Metz in Treatment: 0 Vital Signs Height(in): Capillary Blood Glucose(mg/dl): 251 Weight(lbs): Pulse(bpm): 85 Body Mass Index(BMI): Blood Pressure(mmHg): 145/79 Temperature(F): 98 Respiratory Rate(breaths/min): 16 Wound Assessments Treatment Notes Electronic Signature(s) Signed: 04/21/2022 2:46:22 PM By: Kalman Shan DO Entered By: Kalman Shan on 04/21/2022 14:33:03 --------------------------------------------------------------------------------  Pain Assessment Details Patient Name: Date of Service: [name] [name] 04/21/2022 1:15 PM Medical Record Number: [id number] Patient Account Number: [id number] Date of Birth/Sex: Treating RN: 17-Jun-1971 (51 y.o. [name] Primary Care Latia Mataya: [name] Other Clinician: Referring Virgal Warmuth: Treating Jakyah Bradby/Extender: Delano Metz in Treatment: 0 Active Problems Location of Pain Severity and Description of Pain Patient Has Paino Yes Site Locations Pain Location: Pain in Ulcers With Dressing Change: Yes Duration of the Pain. Constant / Intermittento Intermittent Rate the pain. Current Pain Level: 3 Character of Pain Describe the Pain: Aching, Tender Pain Management and Medication Current Pain Management: Medication: No Cold Application: No Rest: Yes Massage: No Activity: No T.E.N.S.: No Heat Application: No Leg drop or elevation: No Is the Current Pain Management Adequate: Adequate How does your wound impact your  activities of daily livingo Sleep: No Bathing: No Appetite: No Relationship With Others: No Bladder Continence: No Emotions: No Bowel Continence: No Work: No Toileting: No Drive: No Dressing: No Hobbies: No Electronic Signature(s) Signed: 04/21/2022 4:27:43 PM By: Lorrin Jackson Entered By: Lorrin Jackson on 04/21/2022 13:57:43 -------------------------------------------------------------------------------- Patient/Caregiver Education Details Patient Name: Date of Service: [name] [name] 5/30/2023andnbsp1:15 PM Medical Record Number: [id number] Patient Account Number: [id number] Date of Birth/Gender: Treating RN: [birth year] (51 y.o. [name] Primary Care Physician: [name] Other Clinician: Referring Physician: Treating Physician/Extender: Delano Metz in Treatment: 0 Education Assessment Education Provided To: Patient Education Topics Provided Elevated Blood Sugar/ Impact on Healing: Methods: Explain/Verbal, Printed Responses: State content correctly Notes Instructed to use Urea Cream to [employer]) Signed: 04/21/2022 4:27:43 PM By: Lorrin Jackson Entered By: Lorrin Jackson on 04/21/2022 14:19:05 -------------------------------------------------------------------------------- Vitals Details Patient Name: Date of Service: [name] [name], [name] 04/21/2022 1:15 PM Medical Record Number: [id number] Patient Account Number: [id number] Date of Birth/Sex: Treating RN: 1971/09/14 (51 y.o. [name] Primary Care Avie Checo: [name] Other Clinician: Referring Nardos Putnam: Treating Ryszard Socarras/Extender: Delano Metz in Treatment: 0 Vital Signs Time Taken: 13:45 Temperature (F): 98 Pulse (bpm): 85 Respiratory Rate (breaths/min): 16 Blood Pressure (mmHg): 145/79 Capillary Blood Glucose (mg/dl): 251 Reference Range: 80 - 120 mg / dl Electronic Signature(s) Signed:  04/21/2022 4:27:43 PM By: Lorrin Jackson Entered By: Lorrin Jackson on 04/21/2022 13:48:06

## 2022-04-21 ENCOUNTER — Encounter (HOSPITAL_BASED_OUTPATIENT_CLINIC_OR_DEPARTMENT_OTHER): Payer: Medicare Other | Attending: Internal Medicine | Admitting: Internal Medicine

## 2022-04-21 DIAGNOSIS — L84 Corns and callosities: Secondary | ICD-10-CM | POA: Diagnosis not present

## 2022-04-21 DIAGNOSIS — Z89511 Acquired absence of right leg below knee: Secondary | ICD-10-CM | POA: Diagnosis not present

## 2022-04-21 DIAGNOSIS — F17200 Nicotine dependence, unspecified, uncomplicated: Secondary | ICD-10-CM | POA: Insufficient documentation

## 2022-04-21 DIAGNOSIS — E1140 Type 2 diabetes mellitus with diabetic neuropathy, unspecified: Secondary | ICD-10-CM | POA: Diagnosis not present

## 2022-04-21 LAB — POCT HEMOGLOBIN-HEMACUE: Hemoglobin: 10.1 g/dL — ABNORMAL LOW (ref 13.0–17.0)

## 2022-04-21 NOTE — Progress Notes (Signed)
[name] [name], [name] [name] ([id number]) Visit Report for 04/21/2022 Chief Complaint Document Details Patient Name: Date of Service: [name] [name] [name] 04/21/2022 1:15 PM Medical Record Number: [id number] Patient Account Number: [id number] Date of Birth/Sex: Treating RN: 06-01-1971 (51 y.o. M) Primary Care Provider: Ferd [name] Other Clinician: Referring Provider: Treating Provider/Extender: [name] [name] [name] in Treatment: 0 Information Obtained from: Patient Chief Complaint patient is here for a review of the wound on his plantar left first metatarsal head and back of his right leg Electronic Signature(s) Signed: 04/21/2022 2:46:22 PM By: Kalman Shan DO Entered By: Kalman Shan on 04/21/2022 14:33:15 -------------------------------------------------------------------------------- HPI Details Patient Name: Date of Service: [name] [name] [name], [name] [name] [name] 04/21/2022 1:15 PM Medical Record Number: [id number] Patient Account Number: [id number] Date of Birth/Sex: Treating RN: Apr 27, 1971 (51 y.o. M) Primary Care Provider: Ferd [name] Other Clinician: Referring Provider: Treating Provider/Extender: [name] [name] [name] in Treatment: 0 History of Present Illness HPI Description: 04/21/18 ADMISSION This is a 51 year old man who is a type II diabetic. In spite of fact his hemoglobin A1c is actually quite good 5.73 months ago he is at a really difficult time over the last 6-7 months. He developed a rapidly progressive infection in the right foot in November 2018 associated with osteomyelitis and necrotizing fasciitis. 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 The stump required and a revision on 11/24/17. The stump revision was advertised as being secondary to falls. I'm not sure the progressive history here however this area is actually closed over. The patient tells me over the same timeframe he has had wounds on the plantar aspect of his left foot. In the ED  saw Dr. Sharol Given in April of this year. 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 It is really not clear that this patient is been dressing this with anything. He came in with the clinic without any specific dressing on the wound areas using his own tennis shoe. He tells me that he only ambulates of course to do a pivot transfer. He does not have his prosthesis for the right leg as of yet and he blames Medicaid for this. He does however use the foot to push himself along in his wheelchair at home. The patient has not had formal arterial studies. ABI in our clinic on the left was 1.13. Patient's past medical history includes type 2 diabetes, fracture of the right fibula. I see that he was treated for abscesses on his right buttock and chest in 2016. I did not look at the microbiology of this. 04/28/18; patient comes back in the clinic today with the wound pretty much the same as when he came in here last week. Small opening lots of undermining relatively. He tells [place] that nothing is really been on this for 3 days in spite of the fact that we gave him enough to dress this easily especially such a small wound. He says he lives with his mother, she is not capable of assisting with this he is changing the dressings himself. 05/05/18; much better-looking wound today. Smaller. There is some undermining medially however that's only perhaps 2 mm. No surrounding erythema. We've been using silver collagen 05/12/18; small wound on the first metatarsal head. No undermining no surrounding erythema. We've been using silver collagen he has home health coming out to change 05/20/18 the wound on the first metatarsal head looks better. Covered in surface debris/callus nonviable tissue. Required debridement but post debridement  this looks quite good. We've been using silver collagen. He has home health 05/27/18; first metatarsal head wound continues to improve. Just about completely  closed. Still a lot of surface debridement callus. We've been using silver collagen 06/06/18; the first metatarsal wound is completely healed over. There is still a lot of callus. I gently removed some of this just to make sure that there was no open area and there is not. The patient mentions to me that his left leg has felt like "lead" for about a week READMISSION 08/16/2020 This is a 51 year old man with type 2 diabetes. He returns to clinic today with a 1 month history of a blister and callus over the first metatarsal head. This is in exactly the same area as when he was here in 2019. 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 He has standard running shoes on the left foot to match the area in his prosthesis. He has only been covering this area with a Band-Aid has not really been specifically dressing this. ABI in our clinic on the left was 1.16. This is essentially stable from the value in 2019 10/1 the patient has a linear wound over the left first metatarsal head. Again not a lot of callus thick skin around this that I removed with a #3 curette. This is not go deep to bone or muscle it does not look to be infected. 10/8 first metatarsal head. The wound looks like it is closing however this is going to be a difficult area to offload in the future. He has a size 15 shoe and he says his shoes are years old. The inserts in these current shoes are totally useless and I have advised him to replace these. He may need a new pair of shoes and I have he got original prosthesis at [facility] [place] I have asked him to go back there. 11/9; the patient has not been here in more than a month. Not sure he was healed when he was here the last time. I told him he would have to get new shoes and certainly offload the area over the left first metatarsal head. He has done nothing of the kind. He arrives back in clinic with the same old new balance sneakers. A very  large separating callus over the first metatarsal head on the left 11/16; he has purchased new shoes and has at least 2 insoles like I asked. The wound is measuring much smaller. He is using silver alginate he changes the dressing himself 11/30; wound is measurably smaller in length of about a half a centimeter. This is generally very little depth. We have been using silver alginate. He changes the dressing himself every second day. 12/14; wound is about the same size. Significant undermining medially relative to the size of the wound. We have been using silver alginate. There is not a way to offload this area as he walks with a prosthesis on the right leg 12/28; wound is about the same size. Again he has undermining medially. I am using polymen on this 12/02/2020; the wound looks smaller. Still a senescent edge. We have been using polymen 1/24; the wound is come down a few millimeters. Still a senescent edge. I have been using polymen 2/8; the wound is come down slightly. Still with slight undermining from 12-6 o'clock I have been using polymen partially to get offloading on this area which is opposed by his  prosthesis on the other leg. 2/22 quite open improvement he still has a comma shaped wound on the left first metatarsal head. Some depth. Using polymen 3/14; he still has the same problem of a comma shaped area over the left first metatarsal head. This seems to have had more depth today at 0.6 cm. We have been using polymen. The patient changes this himself every second day. I explored the idea of a total contact cast on the left leg however this is his driving foot. He was not enamored with the idea of not being able to drive and states that he does not have anybody else to get him around 3/28; again he comes in with a smaller looking wound however there is depth and undermining requiring debridement. We have been using Hydrofera Blue, changed to silver collagen today. The patient is doing the  dressing himself 4/11; patient presents today for follow-up of his left diabetic foot wound. He denies any issues in the past 2 weeks. He is currently using silver collagen every other day with dressing changes. He does not use diabetic shoes or special inserts to his sneakers. He does not use felt pads for offloading. 4/19; patient presents for his 1 week follow-up. He denies any issues. He reports using Hydrofera Blue and has not been using silver collagen for dressing changes. He reports minimal drainage to the wound. He overall feels well. 5/3; patient presents for 2-week follow-up. He has been using silver collagen without issues. He has no complaints today and states he overall feels well. 5/17; patient presents for 2-week follow-up. He states that the sleeve is rubbing up against the back of his right leg and causing a wound. He states this happened a week ago and has not been dressing the wound. The plantar wound is stable and he has been using collagen every other day. 5/24; Patient was had a recent hospitalization for cellulitis of his right popliteal fossa. He was admitted and started on IV antibiotics. He does not recall the plantar wound dressed while inpatient. He feels a lot better today. He was discharged on 5/21 on oral antibiotics and has not picked this up until today. He reports using collagen to the plantar wound since discharge. He is keeping the right posterior knee wound covered. He is getting a new sleeve for his prostatic on 5/26. He currently denies signs of infection. 6/1; patient presents for 1 week follow-up. He is still on doxycycline. He has not been dressing the back of the right knee wound. He states he is receiving his new prosthetic sleeve tomorrow. He states he is using silver alginate to the plantar foot wound. He currently denies signs of infection. He states he has not been offloading the left foot wound 6/7; patient presents for 1 week follow-up. He completes  his last dose of doxycycline today. He finally received his prosthetic sleeve for the right side and he states it feels much better. He has been using antibiotic ointment to the back of that right knee wound. He has been using silver alginate to the plantar foot wound. He currently denies signs of infection. 6/21; patient presents for 2-week follow-up. He reports no issues with his prosthetic sleeve. He has been using antibiotic ointment to the back of the right knee. He has been using silver alginate to the plantar foot wound. He has no issues or complaints today. He denies signs of infection. 7/5; patient presents for 2-week follow-up. He reports no issues or complaints today. He  continues to use antibiotic ointment to the right posterior knee wound and silver alginate to the left plantar foot wound. He denies signs of infection. 7/19; patient presents for 2-week follow-up. He continues to use antibiotic ointment to the right posterior knee and silver alginate to the left plantar foot wound. He has no issues or complaints today. He denies signs of infection. 8/4; patient presents for 2-week follow-up. He has no issues or complaints today. He denies signs of infection. 8/18; patient presents for 2-week follow-up. He has no issues or concerns today. He has been approved for Apligraf and he would like to have this placed today. He denies signs of infection and reports that his right posterior knee wound is closed. 8/25; patient presents for 1 week follow-up. He had no issues with his first Apligraf placement last week. He did however develop an abscess to the back of his right knee. He reports tenderness to this area. He denies systemic signs of infection. 9/1; patient presents for 1 week follow-up. He has had no issues with his plantar foot wound. The Apligraf has stayed in place over the past week. A wound culture was done at last clinic visit and a new antibiotic was sent to the pharmacy. Patient  has not received this yet. He reports some mild tenderness to the back of his right knee. He denies systemic signs of infection. 9/8; patient presents for 1 week follow-up. He states he took Keflex and had diarrhea as a side effect. He was able to complete the course. He reports continued tenderness to the right posterior knee wound. He has some drainage still. He denies increased warmth or erythema to the area. He denies fever/chills or nausea/vomiting. He has no issues or complaints today about his plantar foot wound. 9/15; patient presents for follow-up. He reports improvement in pain to the back of his right leg. He does not report any drainage. He had an ultrasound completed on 9/9 that showed a superficial fluid collection concerning for abscess. We attempted to call patient with results with no answer. We did put an urgent referral to general surgery and he reports he has not received a call. He currently denies systemic signs of infection. He reports no issues to his left plantar foot wound. 9/27; patient presents for follow-up. He has no issues or complaints today. He has had no issues with the previous wound to the back of his right leg. He has not made an appointment to see general surgery. He had Apligraf placed at last clinic visit to the plantar foot wound. He currently denies signs of infection. 10/7; patient presents for follow-up. Upon entering the room there is vomit throughout the entire floor. Patient states he does not feel well. I recommended he go to urgent care. I recommended following up next week for wound care. 10/10; patient presents for follow-up. He came last week for his follow-up however he was ill and started vomiting in the exam room. Today he states he feels better however he continues to have vomiting episodes. He states he attempted to go to urgent care/the ED but could not find a parking spot. He subsequently went home. He has no issues or complaints today  regarding the foot wound. He never followed up with general surgery for the abscess identified on ultrasound to the back of his right knee. 10/20; patient presents for follow-up. He has no issues or complaints today. Overall he feels well today. 10/27; patient presents for follow-up. He has no issues or  complaints today. 11/3; patient presents for follow-up. He has no issues or complaints today. He states he does not want to follow-up with orthopedics to reevaluate the abscess in the back of his right leg. He denies signs of infection. 11/15; patient presents for follow-up. He has no issues or complaints today. He had been using silver alginate to the left first met head wound. He denies signs of infection. 11/22; patient presents for follow-up. He had Affinity placed at last clinic visit. He has no issues or complaints today. 11/29; patient presents for follow-up. He is in good spirits today. He has no issues or complaints today. 12/6; patient presents for follow-up. He has no issues or complaints today. 12/13; patient presents for follow-up. He denies signs of infection. He has no issues or complaints today. 12/20; patient presents for follow-up. He has no issues or complaints today. He denies signs of infection. 12/29; patient presents for follow-up. He states that a few days ago he has had reaccumulation of an abscess behind the right knee. He denies increased warmth or erythema to the area. He denies fever/chills or drainage. 1/6; patient presents for follow-up. He states he went to [facility] emergency department to be evaluated for the abscess to his right posterior knee. He was started on clindamycin. They were unable to drain the abscess. Patient states that eventually the abscess ruptured. He reports improvement in his symptoms. He has no issues or complaints about his left foot wound. 1/17; patient presents for follow-up. He has no issues or complaints today. 1/24; patient  presents for follow-up. He has no issues or complaints today. He denies signs of infection. 1/31; patient presents for follow-up. He has been approved for Affinity however has a 20% co-pay. Patient has declined further Affinity treatments due to this. He currently denies signs of infection. 2/7; patient presents for follow-up. He has no issues or complaints today. He denies signs of infection. He has been doing Hydrofera Blue dressing changes. 2/14; patient presents for follow-up. He has no issues or complaints today. He has been using blast X with collagen to the wound bed. He currently denies any issues. 2/28; patient presents for follow-up. He denies signs of infection. He reports using blast X with collagen to the wound bed. 3/7; patient presents for follow-up. He states he has been using gentamicin ointment and urea cream with dressing changes. He reports that the back of his right knee has developed an abscess again and is draining. He states this started today. 02/03/2022: The patient came to clinic today without any dressing on his foot. He says that he took a shower yesterday and felt like he did not need a dressing and so he did not apply it. He is not offloading this foot and is wearing a regular athletic shoe. 3/21; patient presents for follow-up. He did not come in with a dressing on his wound. He states he has been using gentamicin ointment daily. He has a Pegasys insert to his tennis shoe for offloading. He currently denies signs of infection. 3/28; patient presents for follow-up. He reports using gentamicin ointment to the wound bed. He has no issues or complaints today. 5/30/2023consult Patient presents because he states he tried to remove the callus on his plantar left foot and thinks he created a wound. He has been keeping the area covered. He denies drainage. Electronic Signature(s) Signed: 04/21/2022 2:46:22 PM By: Kalman Shan DO Entered By: Kalman Shan on 04/21/2022  14:34:20 -------------------------------------------------------------------------------- Physical Exam Details Patient Name: Date  of Service: [name] [name] [name] 04/21/2022 1:15 PM Medical Record Number: [id number] Patient Account Number: [id number] Date of Birth/Sex: Treating RN: 1971-03-02 (51 y.o. M) Primary Care Provider: Ferd [name] Other Clinician: Referring Provider: Treating Provider/Extender: [name] [name] [name] in Treatment: 0 Constitutional respirations regular, non-labored and within target range for patient.. Cardiovascular 2+ dorsalis pedis/posterior tibialis pulses. Psychiatric pleasant and cooperative. Notes Left foot: T the first met head there is a callus. Callus was pared down and there is no open wounds present. No drainage noted. Nothing suspicious o appreciated. Electronic Signature(s) Signed: 04/21/2022 2:46:22 PM By: Kalman Shan DO Entered By: Kalman Shan on 04/21/2022 14:36:05 -------------------------------------------------------------------------------- Physician Orders Details Patient Name: Date of Service: [name] [name] [name], [name] [name] [name] 04/21/2022 1:15 PM Medical Record Number: [id number] Patient Account Number: [id number] Date of Birth/Sex: Treating RN: Aug 13, 1971 (51 y.o. [name] Primary Care Provider: Ferd [name] Other Clinician: Referring Provider: Treating Provider/Extender: [name] [name] [name] in Treatment: 0 Verbal / Phone Orders: No Diagnosis Coding Discharge From [facility] Services Discharge from [place] Only Non Wound Condition pply the following to affected area as directed: - Use Urea Cream to callous daily for next 2 weeks. A Patient Medications llergies: adhesive tape, Fer-Iron A Notifications Medication Indication Start End 04/21/2022 Ureacin-20 DOSE 1 - topical 20 % cream - apply daily to calloused area Electronic Signature(s) Signed: 04/21/2022 2:46:22  PM By: Kalman Shan DO Previous Signature: 04/21/2022 2:38:41 PM Version By: Kalman Shan DO Entered By: Kalman Shan on 04/21/2022 14:39:07 -------------------------------------------------------------------------------- Problem List Details Patient Name: Date of Service: [name] [name] [name], [name] [name] [name] 04/21/2022 1:15 PM Medical Record Number: [id number] Patient Account Number: [id number] Date of Birth/Sex: Treating RN: 06-25-1971 (51 y.o. M) Primary Care Provider: Ferd [name] Other Clinician: Referring Provider: Treating Provider/Extender: [name] [name] [name] in Treatment: 0 Active Problems ICD-10 Encounter Code Description Active Date MDM Diagnosis E11.40 Type 2 diabetes mellitus with diabetic neuropathy, unspecified 04/21/2022 No Yes L84 Corns and callosities 04/21/2022 No Yes Z89.511 Acquired absence of right leg below knee 04/21/2022 No Yes Inactive Problems Resolved Problems Electronic Signature(s) Signed: 04/21/2022 2:46:22 PM By: Kalman Shan DO Entered By: Kalman Shan on 04/21/2022 14:32:56 -------------------------------------------------------------------------------- Progress Note Details Patient Name: Date of Service: [name] [name] [name], [name] [name] [name] 04/21/2022 1:15 PM Medical Record Number: [id number] Patient Account Number: [id number] Date of Birth/Sex: Treating RN: [birth year] (51 y.o. M) Primary Care Provider: Ferd [name] Other Clinician: Referring Provider: Treating Provider/Extender: [name] [name] [name] in Treatment: 0 Subjective Chief Complaint Information obtained from Patient patient is here for a review of the wound on his plantar left first metatarsal head and back of his right leg History of Present Illness (HPI) 04/21/18 ADMISSION This is a 51 year old man who is a type II diabetic. In spite of fact his hemoglobin A1c is actually quite good 5.73 months ago he is at a really difficult time over the last 6-7  months. He developed a rapidly progressive infection in the right foot in November 2018 associated with osteomyelitis and necrotizing fasciitis. 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 The stump required and a revision on 11/24/17. The stump revision was advertised as being secondary to falls. I'm not sure the progressive history here however this area is actually closed over. The patient tells me over the same timeframe he has had wounds on the plantar aspect of his left foot. In the ED saw Dr. Sharol Given in April of this year. Noted that have  Wagner grade 1 diabetic ulcers on the fifth didn't first metatarsal heads. It is really not clear that this patient is been dressing this with anything. He came in with the clinic without any specific dressing on the wound areas using his own tennis shoe. He tells me that he only ambulates of course to do a pivot transfer. He does not have his prosthesis for the right leg as of yet and he blames Medicaid for this. He does however use the foot to push himself along in his wheelchair at home. The patient has not had formal arterial studies. ABI in our clinic on the left was 1.13. Patient's past medical history includes type 2 diabetes, fracture of the right fibula. I see that he was treated for abscesses on his right buttock and chest in 2016. I did not look at the microbiology of this. 04/28/18; patient comes back in the clinic today with the wound pretty much the same as when he came in here last week. Small opening lots of undermining relatively. He tells [place] that nothing is really been on this for 3 days in spite of the fact that we gave him enough to dress this easily especially such a small wound. He says he lives with his mother, she is not capable of assisting with this he is changing the dressings himself. 05/05/18; much better-looking wound today. Smaller. There is some undermining medially however that's only perhaps 2 mm. No surrounding erythema. We've been using  silver collagen 05/12/18; small wound on the first metatarsal head. No undermining no surrounding erythema. We've been using silver collagen he has home health coming out to change 05/20/18 the wound on the first metatarsal head looks better. Covered in surface debris/callus nonviable tissue. Required debridement but post debridement this looks quite good. We've been using silver collagen. He has home health 05/27/18; first metatarsal head wound continues to improve. Just about completely closed. Still a lot of surface debridement callus. We've been using silver collagen 06/06/18; the first metatarsal wound is completely healed over. There is still a lot of callus. I gently removed some of this just to make sure that there was no open area and there is not. The patient mentions to me that his left leg has felt like "lead" for about a week READMISSION 08/16/2020 This is a 51 year old man with type 2 diabetes. He returns to clinic today with a 1 month history of a blister and callus over the first metatarsal head. This is in exactly the same area as when he was here in 2019. 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 He has standard running shoes on the left foot to match the area in his prosthesis. He has only been covering this area with a Band-Aid has not really been specifically dressing this. ABI in our clinic on the left was 1.16. This is essentially stable from the value in 2019 10/1 the patient has a linear wound over the left first metatarsal head. Again not a lot of callus thick skin around this that I removed with a #3 curette. This is not go deep to bone or muscle it does not look to be infected. 10/8 first metatarsal head. The wound looks like it is closing however this is going to be a difficult area to offload in the future. He has a size 15 shoe and he says his shoes are years old. The inserts in these current shoes are totally  useless and I  have advised him to replace these. He may need a new pair of shoes and I have he got original prosthesis at [facility] [place] I have asked him to go back there. 11/9; the patient has not been here in more than a month. Not sure he was healed when he was here the last time. I told him he would have to get new shoes and certainly offload the area over the left first metatarsal head. He has done nothing of the kind. He arrives back in clinic with the same old new balance sneakers. A very large separating callus over the first metatarsal head on the left 11/16; he has purchased new shoes and has at least 2 insoles like I asked. The wound is measuring much smaller. He is using silver alginate he changes the dressing himself 11/30; wound is measurably smaller in length of about a half a centimeter. This is generally very little depth. We have been using silver alginate. He changes the dressing himself every second day. 12/14; wound is about the same size. Significant undermining medially relative to the size of the wound. We have been using silver alginate. There is not a way to offload this area as he walks with a prosthesis on the right leg 12/28; wound is about the same size. Again he has undermining medially. I am using polymen on this 12/02/2020; the wound looks smaller. Still a senescent edge. We have been using polymen 1/24; the wound is come down a few millimeters. Still a senescent edge. I have been using polymen 2/8; the wound is come down slightly. Still with slight undermining from 12-6 o'clock I have been using polymen partially to get offloading on this area which is opposed by his prosthesis on the other leg. 2/22 quite open improvement he still has a comma shaped wound on the left first metatarsal head. Some depth. Using polymen 3/14; he still has the same problem of a comma shaped area over the left first metatarsal head. This seems to have had more depth today at 0.6 cm. We have been  using polymen. The patient changes this himself every second day. I explored the idea of a total contact cast on the left leg however this is his driving foot. He was not enamored with the idea of not being able to drive and states that he does not have anybody else to get him around 3/28; again he comes in with a smaller looking wound however there is depth and undermining requiring debridement. We have been using Hydrofera Blue, changed to silver collagen today. The patient is doing the dressing himself 4/11; patient presents today for follow-up of his left diabetic foot wound. He denies any issues in the past 2 weeks. He is currently using silver collagen every other day with dressing changes. He does not use diabetic shoes or special inserts to his sneakers. He does not use felt pads for offloading. 4/19; patient presents for his 1 week follow-up. He denies any issues. He reports using Hydrofera Blue and has not been using silver collagen for dressing changes. He reports minimal drainage to the wound. He overall feels well. 5/3; patient presents for 2-week follow-up. He has been using silver collagen without issues. He has no complaints today and states he overall feels well. 5/17; patient presents for 2-week follow-up. He states that the sleeve is rubbing up against the back of his right leg and causing a wound. He states this happened a week ago  and has not been dressing the wound. The plantar wound is stable and he has been using collagen every other day. 5/24; Patient was had a recent hospitalization for cellulitis of his right popliteal fossa. He was admitted and started on IV antibiotics. He does not recall the plantar wound dressed while inpatient. He feels a lot better today. He was discharged on 5/21 on oral antibiotics and has not picked this up until today. He reports using collagen to the plantar wound since discharge. He is keeping the right posterior knee wound covered. He is getting  a new sleeve for his prostatic on 5/26. He currently denies signs of infection. 6/1; patient presents for 1 week follow-up. He is still on doxycycline. He has not been dressing the back of the right knee wound. He states he is receiving his new prosthetic sleeve tomorrow. He states he is using silver alginate to the plantar foot wound. He currently denies signs of infection. He states he has not been offloading the left foot wound 6/7; patient presents for 1 week follow-up. He completes his last dose of doxycycline today. He finally received his prosthetic sleeve for the right side and he states it feels much better. He has been using antibiotic ointment to the back of that right knee wound. He has been using silver alginate to the plantar foot wound. He currently denies signs of infection. 6/21; patient presents for 2-week follow-up. He reports no issues with his prosthetic sleeve. He has been using antibiotic ointment to the back of the right knee. He has been using silver alginate to the plantar foot wound. He has no issues or complaints today. He denies signs of infection. 7/5; patient presents for 2-week follow-up. He reports no issues or complaints today. He continues to use antibiotic ointment to the right posterior knee wound and silver alginate to the left plantar foot wound. He denies signs of infection. 7/19; patient presents for 2-week follow-up. He continues to use antibiotic ointment to the right posterior knee and silver alginate to the left plantar foot wound. He has no issues or complaints today. He denies signs of infection. 8/4; patient presents for 2-week follow-up. He has no issues or complaints today. He denies signs of infection. 8/18; patient presents for 2-week follow-up. He has no issues or concerns today. He has been approved for Apligraf and he would like to have this placed today. He denies signs of infection and reports that his right posterior knee wound is  closed. 8/25; patient presents for 1 week follow-up. He had no issues with his first Apligraf placement last week. He did however develop an abscess to the back of his right knee. He reports tenderness to this area. He denies systemic signs of infection. 9/1; patient presents for 1 week follow-up. He has had no issues with his plantar foot wound. The Apligraf has stayed in place over the past week. A wound culture was done at last clinic visit and a new antibiotic was sent to the pharmacy. Patient has not received this yet. He reports some mild tenderness to the back of his right knee. He denies systemic signs of infection. 9/8; patient presents for 1 week follow-up. He states he took Keflex and had diarrhea as a side effect. He was able to complete the course. He reports continued tenderness to the right posterior knee wound. He has some drainage still. He denies increased warmth or erythema to the area. He denies fever/chills or nausea/vomiting. He has no issues or complaints  today about his plantar foot wound. 9/15; patient presents for follow-up. He reports improvement in pain to the back of his right leg. He does not report any drainage. He had an ultrasound completed on 9/9 that showed a superficial fluid collection concerning for abscess. We attempted to call patient with results with no answer. We did put an urgent referral to general surgery and he reports he has not received a call. He currently denies systemic signs of infection. He reports no issues to his left plantar foot wound. 9/27; patient presents for follow-up. He has no issues or complaints today. He has had no issues with the previous wound to the back of his right leg. He has not made an appointment to see general surgery. He had Apligraf placed at last clinic visit to the plantar foot wound. He currently denies signs of infection. 10/7; patient presents for follow-up. Upon entering the room there is vomit throughout the entire  floor. Patient states he does not feel well. I recommended he go to urgent care. I recommended following up next week for wound care. 10/10; patient presents for follow-up. He came last week for his follow-up however he was ill and started vomiting in the exam room. Today he states he feels better however he continues to have vomiting episodes. He states he attempted to go to urgent care/the ED but could not find a parking spot. He subsequently went home. He has no issues or complaints today regarding the foot wound. He never followed up with general surgery for the abscess identified on ultrasound to the back of his right knee. 10/20; patient presents for follow-up. He has no issues or complaints today. Overall he feels well today. 10/27; patient presents for follow-up. He has no issues or complaints today. 11/3; patient presents for follow-up. He has no issues or complaints today. He states he does not want to follow-up with orthopedics to reevaluate the abscess in the back of his right leg. He denies signs of infection. 11/15; patient presents for follow-up. He has no issues or complaints today. He had been using silver alginate to the left first met head wound. He denies signs of infection. 11/22; patient presents for follow-up. He had Affinity placed at last clinic visit. He has no issues or complaints today. 11/29; patient presents for follow-up. He is in good spirits today. He has no issues or complaints today. 12/6; patient presents for follow-up. He has no issues or complaints today. 12/13; patient presents for follow-up. He denies signs of infection. He has no issues or complaints today. 12/20; patient presents for follow-up. He has no issues or complaints today. He denies signs of infection. 12/29; patient presents for follow-up. He states that a few days ago he has had reaccumulation of an abscess behind the right knee. He denies increased warmth or erythema to the area. He denies  fever/chills or drainage. 1/6; patient presents for follow-up. He states he went to the [facility] emergency department to be evaluated for the abscess to his right posterior knee. He was started on clindamycin. They were unable to drain the abscess. Patient states that eventually the abscess ruptured. He reports improvement in his symptoms. He has no issues or complaints about his left foot wound. 1/17; patient presents for follow-up. He has no issues or complaints today. 1/24; patient presents for follow-up. He has no issues or complaints today. He denies signs of infection. 1/31; patient presents for follow-up. He has been approved for Affinity however has a 20% co-pay.  Patient has declined further Affinity treatments due to this. He currently denies signs of infection. 2/7; patient presents for follow-up. He has no issues or complaints today. He denies signs of infection. He has been doing Hydrofera Blue dressing changes. 2/14; patient presents for follow-up. He has no issues or complaints today. He has been using blast X with collagen to the wound bed. He currently denies any issues. 2/28; patient presents for follow-up. He denies signs of infection. He reports using blast X with collagen to the wound bed. 3/7; patient presents for follow-up. He states he has been using gentamicin ointment and urea cream with dressing changes. He reports that the back of his right knee has developed an abscess again and is draining. He states this started today. 02/03/2022: The patient came to clinic today without any dressing on his foot. He says that he took a shower yesterday and felt like he did not need a dressing and so he did not apply it. He is not offloading this foot and is wearing a regular athletic shoe. 3/21; patient presents for follow-up. He did not come in with a dressing on his wound. He states he has been using gentamicin ointment daily. He has a Pegasys insert to his tennis shoe for  offloading. He currently denies signs of infection. 3/28; patient presents for follow-up. He reports using gentamicin ointment to the wound bed. He has no issues or complaints today. 04/21/2022ooconsult Patient presents because he states he tried to remove the callus on his plantar left foot and thinks he created a wound. He has been keeping the area covered. He denies drainage. Patient History Information obtained from Patient, Chart. Allergies adhesive tape (Severity: Mild, Reaction: rash), Fer-Iron Family History Diabetes - Maternal Grandparents,Mother, Heart Disease - Father, Hypertension - Father, No family history of Cancer, Hereditary Spherocytosis, Kidney Disease, Lung Disease, Seizures, Stroke, Thyroid Problems, Tuberculosis. Social History Current every day smoker, Marital Status - Single, Alcohol Use - Never, Drug Use - No History, Caffeine Use - Never. Medical History Eyes Denies history of Cataracts, Glaucoma, Optic Neuritis Ear/Nose/Mouth/Throat Denies history of Chronic sinus problems/congestion, Middle ear problems Hematologic/Lymphatic Patient has history of Anemia Denies history of Hemophilia, Human Immunodeficiency Virus, Lymphedema, Sickle Cell Disease Respiratory Denies history of Aspiration, Asthma, Chronic Obstructive Pulmonary Disease (COPD), Pneumothorax, Sleep Apnea, Tuberculosis Cardiovascular Patient has history of Hypertension, Peripheral Venous Disease Denies history of Angina, Arrhythmia, Congestive Heart Failure, Coronary Artery Disease, Deep Vein Thrombosis, Hypotension, Myocardial Infarction, Peripheral Arterial Disease, Phlebitis, Vasculitis Gastrointestinal Denies history of Cirrhosis , Colitis, Crohnoos, Hepatitis A, Hepatitis B, Hepatitis C Endocrine Patient has history of Type II Diabetes - oral meds Denies history of Type I Diabetes Genitourinary Denies history of End Stage Renal Disease Immunological Denies history of Lupus Erythematosus,  Raynaudoos, Scleroderma Integumentary (Skin) Denies history of History of Burn Musculoskeletal Denies history of Gout, Rheumatoid Arthritis, Osteoarthritis, Osteomyelitis Neurologic Denies history of Dementia, Neuropathy, Quadriplegia, Paraplegia, Seizure Disorder Oncologic Denies history of Received Chemotherapy, Received Radiation Psychiatric Denies history of Anorexia/bulimia, Confinement Anxiety Patient is treated with Insulin, Oral Agents. Blood sugar is tested. Blood sugar results noted at the following times: Breakfast - 91. Hospitalization/Surgery History - fever 105, sepsis cellulitis right popliteal fossa 04/09/2021. Medical A Surgical History Notes [name] Constitutional Symptoms (General Health) falls , leukocytosis Respiratory During waking hours and catches himself not breathing, "gasp for air". Saw Dr Wynonia Lawman, he couldn't find a reason Objective Constitutional respirations regular, non-labored and within target range for patient.. Vitals Time Taken: 1:45 PM,  Temperature: 98 F, Pulse: 85 bpm, Respiratory Rate: 16 breaths/min, Blood Pressure: 145/79 mmHg, Capillary Blood Glucose: 251 mg/dl. Cardiovascular 2+ dorsalis pedis/posterior tibialis pulses. Psychiatric pleasant and cooperative. General Notes: Left foot: T the first met head there is a callus. Callus was pared down and there is no open wounds present. No drainage noted. Nothing o suspicious appreciated. Assessment Active Problems ICD-10 Type 2 diabetes mellitus with diabetic neuropathy, unspecified Corns and callosities Acquired absence of right leg below knee Patient presents concerned he has a wound to the plantar aspect of his left foot where there is a callus. Fortunately there are no open wounds. The callus was pared down with a 10blade and 3 currette. The area is very hardened and would benefit from urea cream. I recommended he use this daily for the next 1 to 2 weeks. He may follow-up as  needed. Plan Discharge From [facility] Services: Discharge from [place] Only Non Wound Condition: Apply the following to affected area as directed: - Use Urea Cream to callous daily for next 2 weeks. The following medication(s) was prescribed: Ureacin-20 topical 20 % cream 1 apply daily to calloused area starting 04/21/2022 1. Callus debridement 2. Follow-up as needed 3. Urea cream for the next 1 to 2 weeks Electronic Signature(s) Signed: 04/21/2022 2:46:22 PM By: Kalman Shan DO Entered By: Kalman Shan on 04/21/2022 14:45:51 -------------------------------------------------------------------------------- HxROS Details Patient Name: Date of Service: [name] [name] [name], [name] [name] [name] 04/21/2022 1:15 PM Medical Record Number: [id number] Patient Account Number: [id number] Date of Birth/Sex: Treating RN: 1971/11/18 (51 y.o. [name] Primary Care Provider: Ferd [name] Other Clinician: Referring Provider: Treating Provider/Extender: [name] [name] [name] in Treatment: 0 Information Obtained From Patient Chart Constitutional Symptoms (General Health) Medical History: Past Medical History Notes: falls , leukocytosis Eyes Medical History: Negative for: Cataracts; Glaucoma; Optic Neuritis Ear/Nose/Mouth/Throat Medical History: Negative for: Chronic sinus problems/congestion; Middle ear problems Hematologic/Lymphatic Medical History: Positive for: Anemia Negative for: Hemophilia; Human Immunodeficiency Virus; Lymphedema; Sickle Cell Disease Respiratory Medical History: Negative for: Aspiration; Asthma; Chronic Obstructive Pulmonary Disease (COPD); Pneumothorax; Sleep Apnea; Tuberculosis Past Medical History Notes: During waking hours and catches himself not breathing, "gasp for air". Saw Dr Wynonia Lawman, he couldn't find a reason Cardiovascular Medical History: Positive for: Hypertension; Peripheral Venous Disease Negative for: Angina;  Arrhythmia; Congestive Heart Failure; Coronary Artery Disease; Deep Vein Thrombosis; Hypotension; Myocardial Infarction; Peripheral Arterial Disease; Phlebitis; Vasculitis Gastrointestinal Medical History: Negative for: Cirrhosis ; Colitis; Crohns; Hepatitis A; Hepatitis B; Hepatitis C Endocrine Medical History: Positive for: Type II Diabetes - oral meds Negative for: Type I Diabetes Time with diabetes: 2014 Treated with: Insulin, Oral agents Blood sugar tested every day: Yes Tested : CGM Blood sugar testing results: Breakfast: 91 Genitourinary Medical History: Negative for: End Stage Renal Disease Immunological Medical History: Negative for: Lupus Erythematosus; Raynauds; Scleroderma Integumentary (Skin) Medical History: Negative for: History of Burn Musculoskeletal Medical History: Negative for: Gout; Rheumatoid Arthritis; Osteoarthritis; Osteomyelitis Neurologic Medical History: Negative for: Dementia; Neuropathy; Quadriplegia; Paraplegia; Seizure Disorder Oncologic Medical History: Negative for: Received Chemotherapy; Received Radiation Psychiatric Medical History: Negative for: Anorexia/bulimia; Confinement Anxiety Immunizations Pneumococcal Vaccine: Received Pneumococcal Vaccination: No Implantable Devices None Hospitalization / Surgery History Type of Hospitalization/Surgery fever 105, sepsis cellulitis right popliteal fossa 04/09/2021 Family and Social History Cancer: No; Diabetes: Yes - Maternal Grandparents,Mother; Heart Disease: Yes - Father; Hereditary Spherocytosis: No; Hypertension: Yes - Father; Kidney Disease: No; Lung Disease: No; Seizures: No; Stroke: No; Thyroid Problems: No; Tuberculosis: No; Current every day  smoker; Marital Status - Single; Alcohol Use: Never; Drug Use: No History; Caffeine Use: Never; Financial Concerns: No; Food, Clothing or Shelter Needs: No; Support System Lacking: No; Transportation Concerns: No Electronic  Signature(s) Signed: 04/21/2022 2:46:22 PM By: Kalman Shan DO Signed: 04/21/2022 4:27:43 PM By: Lorrin Jackson Previous Signature: 04/21/2022 9:00:38 AM Version By: Kalman Shan DO Entered By: Lorrin Jackson on 04/21/2022 13:50:00 -------------------------------------------------------------------------------- SuperBill Details Patient Name: Date of Service: [name] [name] [name], [name] [name] [name] 04/21/2022 Medical Record Number: [id number] Patient Account Number: [id number] Date of Birth/Sex: Treating RN: 04-08-1971 (51 y.o. [name] Primary Care Provider: Ferd [name] Other Clinician: Referring Provider: Treating Provider/Extender: [name] [name] [name] in Treatment: 0 Diagnosis Coding ICD-10 Codes Code Description E11.40 Type 2 diabetes mellitus with diabetic neuropathy, unspecified L84 Corns and callosities Z89.511 Acquired absence of right leg below knee Facility Procedures CPT4 Code: [id number] Description: [place] VISIT-LEV 3 EST PT Modifier: 25 Quantity: 1 CPT4 Code: [id number] Description: 02542 - PARE BENIGN LES; SGL ICD-10 Diagnosis Description L84 Corns and callosities E11.40 Type 2 diabetes mellitus with diabetic neuropathy, unspecified Modifier: Quantity: 1 Physician Procedures : CPT4 Code Description Modifier [id number] - WC PHYS LEVEL 3 - EST PT ICD-10 Diagnosis Description E11.40 Type 2 diabetes mellitus with diabetic neuropathy, unspecified L84 Corns and callosities Z89.511 Acquired absence of right leg below knee Quantity: 1 : [id number] - WC PHYS PARE BENIGN LES; SGL ICD-10 Diagnosis Description L84 Corns and callosities E11.40 Type 2 diabetes mellitus with diabetic neuropathy, unspecified Quantity: 1 Electronic Signature(s) Signed: 04/21/2022 2:46:22 PM By: Kalman Shan DO Entered By: Kalman Shan on 04/21/2022 14:45:58

## 2022-04-21 NOTE — Progress Notes (Signed)
HOGAN, HOOBLER ([id number]) Visit Report for 04/21/2022 Abuse Risk Screen Details Patient Name: Date of Service: [name] [name] 04/21/2022 1:15 PM Medical Record Number: [id number] Patient Account Number: [id number] Date of Birth/Sex: Treating RN: 01-16-1971 (51 y.o. [name] Primary Care Chase Knebel: [name] Other Clinician: Referring Torrie Lafavor: Treating Stephanos Fan/Extender: Delano Metz in Treatment: 0 Abuse Risk Screen Items Answer ABUSE RISK SCREEN: Has anyone close to you tried to hurt or harm you recentlyo No Do you feel uncomfortable with anyone in your familyo No Has anyone forced you do things that you didnt want to doo No Electronic Signature(s) Signed: 04/21/2022 4:27:43 PM By: Lorrin Jackson Entered By: Lorrin Jackson on 04/21/2022 13:50:11 -------------------------------------------------------------------------------- Activities of Daily Living Details Patient Name: Date of Service: [name] [name] 04/21/2022 1:15 PM Medical Record Number: [id number] Patient Account Number: [id number] Date of Birth/Sex: Treating RN: 1971/08/25 (51 y.o. [name] Primary Care Rogan Wigley: [name] Other Clinician: Referring Brandalynn Ofallon: Treating Melaysia Streed/Extender: Delano Metz in Treatment: 0 Activities of Daily Living Items Answer Activities of Daily Living (Please select one for each item) Drive Automobile Completely Able T Medications ake Completely Able Use T elephone Completely Able Care for Appearance Completely Able Use T oilet Completely Able Bath / Shower Completely Able Dress Self Completely Able Feed Self Completely Able Walk Completely Able Get In / Out Bed Completely Able Housework Completely Able Prepare Meals Completely [place] for Self Completely Able Electronic Signature(s) Signed: 04/21/2022 4:27:43 PM By: Lorrin Jackson Entered By:  Lorrin Jackson on 04/21/2022 13:50:38 -------------------------------------------------------------------------------- Education Screening Details Patient Name: Date of Service: Andree Elk, [name] 04/21/2022 1:15 PM Medical Record Number: [id number] Patient Account Number: [id number] Date of Birth/Sex: Treating RN: 1971-09-14 (51 y.o. [name] Primary Care Merisa Julio: [name] Other Clinician: Referring Yaris Ferrell: Treating Alucard Fearnow/Extender: Delano Metz in Treatment: 0 Primary Learner Assessed: Patient Learning Preferences/Education Level/Primary Language Learning Preference: Explanation, Demonstration, Printed Material Highest Education Level: High School Preferred Language: English Cognitive Barrier Language Barrier: No Translator Needed: No Memory Deficit: No Emotional Barrier: No Cultural/Religious Beliefs Affecting Medical Care: No Physical Barrier Impaired Vision: No Impaired Hearing: No Decreased Hand dexterity: No Knowledge/Comprehension Knowledge Level: High Comprehension Level: High Ability to understand written instructions: High Ability to understand verbal instructions: High Motivation Anxiety Level: Calm Cooperation: Cooperative Education Importance: Acknowledges Need Interest in Health Problems: Asks Questions Perception: Coherent Willingness to Engage in Self-Management High Activities: Readiness to Engage in Self-Management High Activities: Electronic Signature(s) Signed: 04/21/2022 4:27:43 PM By: Lorrin Jackson Entered By: Lorrin Jackson on 04/21/2022 13:51:10 -------------------------------------------------------------------------------- Fall Risk Assessment Details Patient Name: Date of Service: [name], [name] 04/21/2022 1:15 PM Medical Record Number: [id number] Patient Account Number: [id number] Date of Birth/Sex: Treating RN: Apr 20, 1971 (51 y.o. [name] Primary Care Modesty Rudy:  [name] Other Clinician: Referring Darryel Diodato: Treating Alvin Diffee/Extender: Delano Metz in Treatment: 0 Fall Risk Assessment Items Have you had 2 or more falls in the last 12 monthso 0 No Have you had any fall that resulted in injury in the last 12 monthso 0 No FALLS RISK SCREEN History of falling - immediate or within 3 months 0 No Secondary diagnosis (Do you have 2 or more medical diagnoseso) 15 Yes Ambulatory aid None/bed rest/wheelchair/nurse 0 Yes Crutches/cane/walker 0 No Furniture 0 No Intravenous therapy Access/Saline/Heparin Lock 0 No Gait/Transferring Normal/ bed rest/ wheelchair 0 Yes Weak (short steps with or without  shuffle, stooped but able to lift head while walking, may seek 0 No support from furniture) Impaired (short steps with shuffle, may have difficulty arising from chair, head down, impaired 0 No balance) Mental Status Oriented to own ability 0 Yes Electronic Signature(s) Signed: 04/21/2022 4:27:43 PM By: Lorrin Jackson Entered By: Lorrin Jackson on 04/21/2022 13:51:33 -------------------------------------------------------------------------------- Foot Assessment Details Patient Name: Date of Service: Andree Elk, [name] 04/21/2022 1:15 PM Medical Record Number: [id number] Patient Account Number: [id number] Date of Birth/Sex: Treating RN: [birth year] (51 y.o. [name] Primary Care Kellen Hover: [name] Other Clinician: Referring Kadarious Dikes: Treating Jermain Curt/Extender: Delano Metz in Treatment: 0 Foot Assessment Items Site Locations + = Sensation present, - = Sensation absent, C = Callus, U = Ulcer R = Redness, W = Warmth, M = Maceration, PU = Pre-ulcerative lesion F = Fissure, S = Swelling, D = Dryness Assessment Right: Left: Other Deformity: No No Prior Foot Ulcer: No No Prior Amputation: No No Charcot Joint: No No Ambulatory Status: Ambulatory Without Help Gait:  Steady Electronic Signature(s) Signed: 04/21/2022 4:27:43 PM By: Lorrin Jackson Entered By: Lorrin Jackson on 04/21/2022 13:56:05 -------------------------------------------------------------------------------- Nutrition Risk Screening Details Patient Name: Date of Service: [name] [name] 04/21/2022 1:15 PM Medical Record Number: [id number] Patient Account Number: [id number] Date of Birth/Sex: Treating RN: [birth year] (51 y.o. [name] Primary Care Cletis Muma: [name] Other Clinician: Referring Alessander Sikorski: Treating Jasmen Emrich/Extender: Delano Metz in Treatment: 0 Height (in): Weight (lbs): Body Mass Index (BMI): Nutrition Risk Screening Items Score Screening NUTRITION RISK SCREEN: I have an illness or condition that made me change the kind and/or amount of food I eat 0 No I eat fewer than two meals per day 0 No I eat few fruits and vegetables, or milk products 0 No I have three or more drinks of beer, liquor or wine almost every day 0 No I have tooth or mouth problems that make it hard for me to eat 0 No I don't always have enough money to buy the food I need 0 No I eat alone most of the time 0 No I take three or more different prescribed or over-the-counter drugs a day 1 Yes Without wanting to, I have lost or gained 10 pounds in the last six months 0 No I am not always physically able to shop, cook and/or feed myself 0 No Nutrition Protocols Good Risk Protocol 0 No interventions needed Moderate Risk Protocol High Risk Proctocol Risk Level: Good Risk Score: 1 Electronic Signature(s) Signed: 04/21/2022 4:27:43 PM By: Lorrin Jackson Entered By: Lorrin Jackson on 04/21/2022 13:51:46

## 2022-04-23 ENCOUNTER — Other Ambulatory Visit: Payer: Self-pay | Admitting: Nephrology

## 2022-04-23 DIAGNOSIS — N189 Chronic kidney disease, unspecified: Secondary | ICD-10-CM

## 2022-04-23 DIAGNOSIS — R809 Proteinuria, unspecified: Secondary | ICD-10-CM

## 2022-04-23 DIAGNOSIS — D631 Anemia in chronic kidney disease: Secondary | ICD-10-CM

## 2022-04-23 DIAGNOSIS — R339 Retention of urine, unspecified: Secondary | ICD-10-CM

## 2022-04-23 DIAGNOSIS — N2581 Secondary hyperparathyroidism of renal origin: Secondary | ICD-10-CM

## 2022-04-23 DIAGNOSIS — I739 Peripheral vascular disease, unspecified: Secondary | ICD-10-CM

## 2022-04-23 DIAGNOSIS — I129 Hypertensive chronic kidney disease with stage 1 through stage 4 chronic kidney disease, or unspecified chronic kidney disease: Secondary | ICD-10-CM

## 2022-04-23 DIAGNOSIS — N185 Chronic kidney disease, stage 5: Secondary | ICD-10-CM

## 2022-04-29 ENCOUNTER — Ambulatory Visit
Admission: RE | Admit: 2022-04-29 | Discharge: 2022-04-29 | Disposition: A | Discharge status: Home / Self Care | Payer: Medicare Other | Source: Ambulatory Visit | Attending: Nephrology | Admitting: Nephrology

## 2022-04-29 DIAGNOSIS — D631 Anemia in chronic kidney disease: Secondary | ICD-10-CM

## 2022-04-29 DIAGNOSIS — I129 Hypertensive chronic kidney disease with stage 1 through stage 4 chronic kidney disease, or unspecified chronic kidney disease: Secondary | ICD-10-CM

## 2022-04-29 DIAGNOSIS — N189 Chronic kidney disease, unspecified: Secondary | ICD-10-CM

## 2022-04-29 DIAGNOSIS — R809 Proteinuria, unspecified: Secondary | ICD-10-CM

## 2022-04-29 DIAGNOSIS — I739 Peripheral vascular disease, unspecified: Secondary | ICD-10-CM

## 2022-04-29 DIAGNOSIS — R339 Retention of urine, unspecified: Secondary | ICD-10-CM

## 2022-04-29 DIAGNOSIS — N2581 Secondary hyperparathyroidism of renal origin: Secondary | ICD-10-CM

## 2022-04-29 DIAGNOSIS — N185 Chronic kidney disease, stage 5: Secondary | ICD-10-CM

## 2022-04-30 ENCOUNTER — Encounter (HOSPITAL_COMMUNITY)
Admission: RE | Admit: 2022-04-30 | Discharge: 2022-04-30 | Disposition: A | Discharge status: Home / Self Care | Payer: Medicare Other | Source: Ambulatory Visit | Attending: Nephrology | Admitting: Nephrology

## 2022-04-30 VITALS — BP 139/83 | HR 71 | Temp 97.2°F | Resp 16

## 2022-04-30 DIAGNOSIS — D649 Anemia, unspecified: Secondary | ICD-10-CM | POA: Diagnosis present

## 2022-04-30 DIAGNOSIS — D62 Acute posthemorrhagic anemia: Secondary | ICD-10-CM | POA: Diagnosis present

## 2022-04-30 DIAGNOSIS — N179 Acute kidney failure, unspecified: Secondary | ICD-10-CM | POA: Insufficient documentation

## 2022-04-30 LAB — IRON AND TIBC
Iron: 46 ug/dL (ref 45–182)
Saturation Ratios: 13 % — ABNORMAL LOW (ref 17.9–39.5)
TIBC: 364 ug/dL (ref 250–450)
UIBC: 318 ug/dL

## 2022-04-30 LAB — FERRITIN: Ferritin: 17 ng/mL — ABNORMAL LOW (ref 24–336)

## 2022-04-30 MED ORDER — EPOETIN ALFA-EPBX 10000 UNIT/ML IJ SOLN
INTRAMUSCULAR | Status: AC
  Filled 2022-04-30: qty 1

## 2022-04-30 MED ORDER — EPOETIN ALFA-EPBX 10000 UNIT/ML IJ SOLN
10000.0000 [IU] | INTRAMUSCULAR | Status: DC
  Administered 2022-04-30: 10000 [IU] via SUBCUTANEOUS

## 2022-05-01 LAB — POCT HEMOGLOBIN-HEMACUE
Hemoglobin: 11.3 g/dL — ABNORMAL LOW (ref 13.0–17.0)
Hemoglobin: 14 g/dL (ref 13.0–17.0)

## 2022-05-14 ENCOUNTER — Encounter (HOSPITAL_COMMUNITY)
Admission: RE | Admit: 2022-05-14 | Discharge: 2022-05-14 | Disposition: A | Discharge status: Home / Self Care | Payer: Medicare Other | Source: Ambulatory Visit | Attending: Nephrology | Admitting: Nephrology

## 2022-05-14 VITALS — BP 138/83 | HR 71 | Temp 97.7°F | Resp 16

## 2022-05-14 DIAGNOSIS — N179 Acute kidney failure, unspecified: Secondary | ICD-10-CM | POA: Diagnosis not present

## 2022-05-14 DIAGNOSIS — D62 Acute posthemorrhagic anemia: Secondary | ICD-10-CM

## 2022-05-14 DIAGNOSIS — D649 Anemia, unspecified: Secondary | ICD-10-CM

## 2022-05-14 LAB — POCT HEMOGLOBIN-HEMACUE: Hemoglobin: 10.4 g/dL — ABNORMAL LOW (ref 13.0–17.0)

## 2022-05-14 MED ORDER — EPOETIN ALFA-EPBX 10000 UNIT/ML IJ SOLN
10000.0000 [IU] | INTRAMUSCULAR | Status: DC
  Administered 2022-05-14: 10000 [IU] via SUBCUTANEOUS

## 2022-05-14 MED ORDER — EPOETIN ALFA-EPBX 10000 UNIT/ML IJ SOLN
INTRAMUSCULAR | Status: AC
  Filled 2022-05-14: qty 1

## 2022-05-19 ENCOUNTER — Encounter: Payer: Self-pay | Admitting: Cardiology

## 2022-05-19 ENCOUNTER — Ambulatory Visit (INDEPENDENT_AMBULATORY_CARE_PROVIDER_SITE_OTHER): Payer: Medicare Other | Admitting: Cardiology

## 2022-05-19 VITALS — BP 114/66 | HR 72 | Ht 77.0 in | Wt 243.2 lb

## 2022-05-19 DIAGNOSIS — I471 Supraventricular tachycardia, unspecified: Secondary | ICD-10-CM

## 2022-05-19 MED ORDER — DILTIAZEM HCL ER COATED BEADS 240 MG PO CP24: 240.0000 mg | ORAL_CAPSULE | Freq: Every day | ORAL | 3 refills | Status: DC

## 2022-05-19 NOTE — Patient Instructions (Signed)
Medication Instructions:  Your physician has recommended you make the following change in your medication:  STOP Metoprolol START Diltiazem 240 mg once daily  *If you need a refill on your cardiac medications before your next appointment, please call your pharmacy*   Lab Work: None ordered   Testing/Procedures: None ordered   Follow-Up: At [facility], you and your health needs are our priority.  As part of our continuing mission to provide you with exceptional heart care, we have created designated Provider Care Teams.  These Care Teams include your primary Cardiologist (physician) and Advanced Practice Providers (APPs -  Physician Assistants and Nurse Practitioners) who all work together to provide you with the care you need, when you need it.   Your next appointment:   6 week(s)  The format for your next appointment:   In Person  Provider:   You will see one of the following Advanced Practice Providers on your designated Care Team:   [name] [name], [name] [name] [name] [name], [name] [name]    Thank you for choosing [facility]!!   Dory Horn, RN [phone]  Other Instructions   Important Information About Sugar

## 2022-05-19 NOTE — Progress Notes (Signed)
Electrophysiology Office Note   Date:  05/19/2022   ID:  [name] [name], DOB Jan 25, 1971, MRN [id number]  PCP:  Lance Bosch, NP  Cardiologist:  Clifton James Primary Electrophysiologist:  Regan Lemming, MD    Chief Complaint: SVT   History of Present Illness: [name] [name] is a 51 y.o. male who is being seen today for the evaluation of SVT at the request of Kathleene Hazel*. Presenting today for electrophysiology evaluation.  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  He presented to cardiology clinic initially with palpitations.  He was found to have episodes of SVT.  He has palpitations on a nightly basis.  He does not notice palpitations during the day.  He states that he feels racing in his chest and some mild shortness of breath but otherwise is without major symptoms.  At this point, he would prefer to avoid procedures if possible.  Today, he denies symptoms of palpitations, chest pain, shortness of breath, orthopnea, PND, lower extremity edema, claudication, dizziness, presyncope, syncope, bleeding, or neurologic sequela. The patient is tolerating medications without difficulties.    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)    Depression    Diabetes mellitus without complication (HCC) 2019   GERD (gastroesophageal reflux disease)    Hyperlipidemia    Hypertension    Necrotizing fasciitis (HCC) 10/13/2017   Neuromuscular disorder (HCC)    neuropathy   PAD (peripheral artery disease) (HCC)    Secondary hyperparathyroidism (HCC)    Wound dehiscence 11/24/2017   Past Surgical History:  Procedure Laterality Date    AMPUTATION Right 10/13/2017   Procedure: RIGHT BELOW KNEE AMPUTATION;  Surgeon: Nadara Mustard, MD;  Location: [facility] OR;  Service: Orthopedics;  Laterality: Right;   AMPUTATION Right 11/24/2017   Procedure: AMPUTATION BELOW KNEE REVISION;  Surgeon: Nadara Mustard, MD;  Location: [facility] OR;  Service: Orthopedics;  Laterality: Right;   BIOPSY  09/02/2021   Procedure: BIOPSY;  Surgeon: Imogene Burn, MD;  Location: [facility] ENDOSCOPY;  Service: Gastroenterology;;   COLONOSCOPY  05/11/2019   ESOPHAGOGASTRODUODENOSCOPY (EGD) WITH PROPOFOL N/A 09/02/2021   Procedure: ESOPHAGOGASTRODUODENOSCOPY (EGD) WITH PROPOFOL;  Surgeon: Imogene Burn, MD;  Location: [facility] ENDOSCOPY;  Service: Gastroenterology;  Laterality: N/A;   NO PAST SURGERIES     POLYPECTOMY     WISDOM TOOTH EXTRACTION       Current Outpatient Medications  Medication Sig Dispense Refill   AGAMATRIX ULTRA-THIN LANCETS MISC Check blood sugars before meals twice daily 100 each 11   amLODipine (NORVASC) 10 MG tablet Take 10 mg by mouth daily.     baclofen (LIORESAL) 10 MG tablet 1/2 tab twice a day (Patient taking differently: Take 20 mg by mouth 2 (two) times daily as needed for muscle spasms.) 30 each 6   desvenlafaxine (PRISTIQ) 100 MG 24 hr tablet Take 100 mg by mouth daily.     diltiazem (CARDIZEM CD) 240 MG 24 hr capsule Take 1 capsule (240 mg total) by mouth daily. 30 capsule 3   FARXIGA 10 MG TABS tablet Take 10 mg by mouth daily.     FLUoxetine (PROZAC) 40 MG capsule Take 40 mg by mouth daily.  fluticasone (FLONASE) 50 MCG/ACT nasal spray Place 2 sprays into both nostrils daily as needed for allergies or rhinitis.      furosemide (LASIX) 40 MG tablet Take 40 mg by mouth daily.     gabapentin (NEURONTIN) 300 MG capsule 1 cap by mouth in morning and midday, 2 caps by mouth at bedtime (Patient taking differently: Take 300-600 mg by mouth See admin instructions. Take one capsule (300 mg) by mouth in the morning and afternoon, take two capsules (600  mg) at bedtime) 120 capsule 11   glipiZIDE (GLUCOTROL) 5 MG tablet 1/2 tab by mouth twice daily with meal (Patient taking differently: Take 5 mg by mouth daily with supper.) 15 tablet 11   glucose blood (AGAMATRIX PRESTO TEST) test strip Check blood sugars twice daily before meals 100 each 12   Insulin Disposable Pump (OMNIPOD DASH PODS, GEN 4,) MISC SMARTSIG:SUB-Q Every 3 Days     insulin lispro (HUMALOG) 100 UNIT/ML injection Use as directed via insulin pump. MAX TDD 30 units     metoCLOPramide (REGLAN) 10 MG tablet Take 1 tablet (10 mg total) by mouth 2 (two) times daily. 60 tablet 1   metoprolol tartrate (LOPRESSOR) 25 MG tablet Take 50 mg by mouth 2 (two) times daily.     ondansetron (ZOFRAN) 4 MG tablet Take 1 tablet (4 mg total) by mouth 3 (three) times daily as needed for nausea or vomiting. 30 tablet 0   pantoprazole (PROTONIX) 40 MG tablet Take 1 tablet (40 mg total) by mouth 2 (two) times daily. 60 tablet 2   sucralfate (CARAFATE) 1 GM/10ML suspension Take 10 mLs (1 g total) by mouth 4 (four) times daily -  with meals and at bedtime. 420 mL 0   ARIPiprazole (ABILIFY) 20 MG tablet Take 20 mg by mouth daily. (Patient not taking: Reported on 05/19/2022)     atorvastatin (LIPITOR) 40 MG tablet Take 40 mg by mouth daily. (Patient not taking: Reported on 05/19/2022)     GI Cocktail (alum & mag hydroxide-simethicone/lidocaine)oral mixture Take 15 mLs by mouth 3 (three) times daily as needed. (Patient not taking: Reported on 05/19/2022) 180 mL 1   glucose blood test strip 1 each by Other route as needed for other. Use as instructed (Patient not taking: Reported on 05/19/2022)     TRULICITY 1.5 MG/0.5ML SOPN Inject 1.5 mg into the skin once a week. Friday's (Patient not taking: Reported on 05/19/2022)     No current facility-administered medications for this visit.    Allergies:   Iron and Adhesive [tape]   Social History:  The patient  reports that he has been smoking cigarettes. He has a 17.50  pack-year smoking history. He has never used smokeless tobacco. He reports that he does not drink alcohol and does not use drugs.   Family History:  The patient's family history includes Colon polyps in his mother; Depression in his mother; Diabetes Mellitus II in his mother; Heart disease in his father.    ROS:  Please see the history of present illness.   Otherwise, review of systems is positive for none.   All other systems are reviewed and negative.    PHYSICAL EXAM: VS:  BP 114/66   Pulse 72   Ht 6\' 5"  (1.956 m)   Wt 243 lb 3.2 oz (110.3 kg)   SpO2 98%   BMI 28.84 kg/m  , BMI Body mass index is 28.84 kg/m. GEN: Well nourished, well developed, in no acute distress  HEENT: normal  Neck: no JVD, carotid bruits, or masses Cardiac: RRR; no murmurs, rubs, or gallops,no edema  Respiratory:  clear to auscultation bilaterally, normal work of breathing GI: soft, nontender, nondistended, + BS MS: no deformity or atrophy  Skin: warm and dry Neuro:  Strength and sensation are intact Psych: euthymic mood, full affect  EKG:  EKG is not ordered today. Personal review of the ekg ordered 03/11/22 shows sinus rhythm, rate 83  Recent Labs: 09/02/2021: ALT 9; Magnesium 1.7 12/12/2021: BUN 30; Creatinine, Ser 2.88; Platelets 251; Potassium 4.2; Sodium 133 05/14/2022: Hemoglobin 10.4    Lipid Panel  No results found for: "CHOL", "TRIG", "HDL", "CHOLHDL", "VLDL", "LDLCALC", "LDLDIRECT"   Wt Readings from Last 3 Encounters:  05/19/22 243 lb 3.2 oz (110.3 kg)  03/11/22 237 lb (107.5 kg)  12/12/21 234 lb (106.1 kg)      Other studies Reviewed: Additional studies/ records that were reviewed today include: TTE 03/28/22  Review of the above records today demonstrates:   1. Left ventricular ejection fraction, by estimation, is 60 to 65%. The  left ventricle has normal function. The left ventricle has no regional  wall motion abnormalities. There is mild left ventricular hypertrophy.  Left  ventricular diastolic parameters  were normal.   2. Right ventricular systolic function is normal. The right ventricular  size is normal. Tricuspid regurgitation signal is inadequate for assessing  PA pressure.   3. The mitral valve is normal in structure. No evidence of mitral valve  regurgitation. No evidence of mitral stenosis.   4. The aortic valve has an indeterminant number of cusps. Aortic valve  regurgitation is not visualized. No aortic stenosis is present.   5. The inferior vena cava is normal in size with greater than 50%  respiratory variability, suggesting right atrial pressure of 3 mmHg.   Monitor 03/31/2022 personally reviewed Sinus rhythm. Minimum heart rate of 59 bpm. Maximum heart rate of 171 bpm.  1 run of Ventricular Tachycardia occurred lasting 5 beats  47 Supraventricular Tachycardia  runs occurred, the longest lasting 2 mins 8 seconds. Supraventricular Tachycardia was detected within +/- 45 seconds of symptomatic patient event(s). Rare Premature atrial contractions (<1%) Rare premature ventricular contractions. (<1.0%, 1357)  ASSESSMENT AND PLAN:  1.  SVT: Found on cardiac monitor.  He has episodes almost every night.  He feels palpitations.  He would prefer to avoid procedures for now.  Continues to be symptomatic with his metoprolol.  He would like to try alternative medication.  We Brynli Ollis start diltiazem 240 mg daily.  This can be titrated up as tolerated.    Current medicines are reviewed at length with the patient today.   The patient does not have concerns regarding his medicines.  The following changes were made today: Stop metoprolol, start diltiazem  Labs/ tests ordered today include:  No orders of the defined types were placed in this encounter.    Disposition:   FU 6 weeks  Signed, Rayelynn Loyal Jorja Loa, MD  05/19/2022 12:19 PM     [facility] [address] [place] [address] [phone] (office) [phone]  (fax)

## 2022-05-28 ENCOUNTER — Encounter (HOSPITAL_COMMUNITY)
Admission: RE | Admit: 2022-05-28 | Discharge: 2022-05-28 | Disposition: A | Discharge status: Home / Self Care | Payer: Medicare Other | Source: Ambulatory Visit | Attending: Nephrology | Admitting: Nephrology

## 2022-05-28 VITALS — BP 176/79 | HR 100

## 2022-05-28 DIAGNOSIS — D62 Acute posthemorrhagic anemia: Secondary | ICD-10-CM | POA: Diagnosis present

## 2022-05-28 DIAGNOSIS — N179 Acute kidney failure, unspecified: Secondary | ICD-10-CM | POA: Diagnosis present

## 2022-05-28 DIAGNOSIS — D649 Anemia, unspecified: Secondary | ICD-10-CM | POA: Insufficient documentation

## 2022-05-28 LAB — POCT HEMOGLOBIN-HEMACUE: Hemoglobin: 9.7 g/dL — ABNORMAL LOW (ref 13.0–17.0)

## 2022-05-28 LAB — FERRITIN: Ferritin: 14 ng/mL — ABNORMAL LOW (ref 24–336)

## 2022-05-28 LAB — IRON AND TIBC
Iron: 44 ug/dL — ABNORMAL LOW (ref 45–182)
Saturation Ratios: 15 % — ABNORMAL LOW (ref 17.9–39.5)
TIBC: 294 ug/dL (ref 250–450)
UIBC: 250 ug/dL

## 2022-05-28 MED ORDER — EPOETIN ALFA 10000 UNIT/ML IJ SOLN
INTRAMUSCULAR | Status: AC
  Filled 2022-05-28: qty 1

## 2022-05-28 MED ORDER — EPOETIN ALFA 10000 UNIT/ML IJ SOLN
10000.0000 [IU] | INTRAMUSCULAR | Status: DC
  Administered 2022-05-28: 10000 [IU] via SUBCUTANEOUS

## 2022-05-28 MED ORDER — EPOETIN ALFA-EPBX 10000 UNIT/ML IJ SOLN: 10000.0000 [IU] | INTRAMUSCULAR | Status: DC

## 2022-06-11 ENCOUNTER — Encounter (HOSPITAL_COMMUNITY)
Admission: RE | Admit: 2022-06-11 | Discharge: 2022-06-11 | Disposition: A | Discharge status: Home / Self Care | Payer: Medicare Other | Source: Ambulatory Visit | Attending: Nephrology | Admitting: Nephrology

## 2022-06-11 VITALS — BP 150/81 | HR 94 | Temp 97.4°F | Resp 18

## 2022-06-11 DIAGNOSIS — N179 Acute kidney failure, unspecified: Secondary | ICD-10-CM | POA: Diagnosis not present

## 2022-06-11 DIAGNOSIS — D62 Acute posthemorrhagic anemia: Secondary | ICD-10-CM

## 2022-06-11 DIAGNOSIS — D649 Anemia, unspecified: Secondary | ICD-10-CM

## 2022-06-11 LAB — POCT HEMOGLOBIN-HEMACUE: Hemoglobin: 10.4 g/dL — ABNORMAL LOW (ref 13.0–17.0)

## 2022-06-11 MED ORDER — EPOETIN ALFA 10000 UNIT/ML IJ SOLN: 10000.0000 [IU] | Freq: Once | INTRAMUSCULAR | Status: DC

## 2022-06-11 MED ORDER — EPOETIN ALFA 10000 UNIT/ML IJ SOLN
INTRAMUSCULAR | Status: AC
  Administered 2022-06-11: 10000 [IU] via SUBCUTANEOUS
  Filled 2022-06-11: qty 1

## 2022-06-11 MED ORDER — EPOETIN ALFA-EPBX 10000 UNIT/ML IJ SOLN: 10000.0000 [IU] | INTRAMUSCULAR | Status: DC

## 2022-06-22 ENCOUNTER — Ambulatory Visit (HOSPITAL_BASED_OUTPATIENT_CLINIC_OR_DEPARTMENT_OTHER): Payer: Medicare Other | Admitting: Internal Medicine

## 2022-06-22 ENCOUNTER — Encounter (HOSPITAL_COMMUNITY): Payer: Self-pay

## 2022-06-22 ENCOUNTER — Encounter (HOSPITAL_BASED_OUTPATIENT_CLINIC_OR_DEPARTMENT_OTHER): Payer: Medicare Other | Attending: Internal Medicine | Admitting: Internal Medicine

## 2022-06-22 DIAGNOSIS — L84 Corns and callosities: Secondary | ICD-10-CM | POA: Diagnosis not present

## 2022-06-22 DIAGNOSIS — E1140 Type 2 diabetes mellitus with diabetic neuropathy, unspecified: Secondary | ICD-10-CM | POA: Insufficient documentation

## 2022-06-22 DIAGNOSIS — Z89511 Acquired absence of right leg below knee: Secondary | ICD-10-CM | POA: Diagnosis not present

## 2022-06-22 NOTE — Progress Notes (Signed)
AVELARDO, REESMAN ([id number]) Visit Report for 06/22/2022 Abuse Risk Screen Details Patient Name: Date of Service: [name] Arrow [name] 06/22/2022 3:00 PM Medical Record Number: [id number] Patient Account Number: [id number] Date of Birth/Sex: Treating RN: [birth year] (51 y.o. [name] [name] Primary Care Kariah Loredo: [name] Other Clinician: Referring Brit Wernette: Treating Athalia Setterlund/Extender: Delano Metz in Treatment: 0 Abuse Risk Screen Items Answer ABUSE RISK SCREEN: Has anyone close to you tried to hurt or harm you recentlyo No Do you feel uncomfortable with anyone in your familyo No Has anyone forced you do things that you didnt want to doo No Electronic Signature(s) Signed: 06/22/2022 5:30:04 PM By: Deon Pilling RN, BSN Entered By: Deon Pilling on 06/22/2022 15:16:25 -------------------------------------------------------------------------------- Activities of Daily Living Details Patient Name: Date of Service: [name] Arrow [name] 06/22/2022 3:00 PM Medical Record Number: [id number] Patient Account Number: [id number] Date of Birth/Sex: Treating RN: July 03, 1971 (51 y.o. [name] [name] Primary Care Eulan Heyward: [name] Other Clinician: Referring Chryl Holten: Treating Kanya Potteiger/Extender: Delano Metz in Treatment: 0 Activities of Daily Living Items Answer Activities of Daily Living (Please select one for each item) Drive Automobile Completely Able T Medications ake Completely Able Use T elephone Completely Able Care for Appearance Completely Able Use T oilet Completely Able Bath / Shower Completely Able Dress Self Completely Able Feed Self Completely Able Walk Completely Able Get In / Out Bed Completely Able Housework Completely Able Prepare Meals Completely [place] for Self Completely Able Electronic Signature(s) Signed: 06/22/2022 5:30:04 PM By: Deon Pilling RN,  BSN Entered By: Deon Pilling on 06/22/2022 15:16:41 -------------------------------------------------------------------------------- Education Screening Details Patient Name: Date of Service: [name] [name], [name] 06/22/2022 3:00 PM Medical Record Number: [id number] Patient Account Number: [id number] Date of Birth/Sex: Treating RN: 1971-11-11 (51 y.o. [name] [name] Primary Care Ferguson Gertner: [name] Other Clinician: Referring Alyan Hartline: Treating Dayami Taitt/Extender: Delano Metz in Treatment: 0 Primary Learner Assessed: Patient Learning Preferences/Education Level/Primary Language Learning Preference: Explanation, Demonstration, Printed Material Highest Education Level: High School Preferred Language: English Cognitive Barrier Language Barrier: No Translator Needed: No Memory Deficit: No Emotional Barrier: No Cultural/Religious Beliefs Affecting Medical Care: No Physical Barrier Impaired Vision: No Impaired Hearing: No Decreased Hand dexterity: No Knowledge/Comprehension Knowledge Level: High Comprehension Level: High Ability to understand written instructions: High Ability to understand verbal instructions: High Motivation Anxiety Level: Calm Cooperation: Cooperative Education Importance: Acknowledges Need Interest in Health Problems: Asks Questions Perception: Coherent Willingness to Engage in Self-Management High Activities: Readiness to Engage in Self-Management High Activities: Electronic Signature(s) Signed: 06/22/2022 5:30:04 PM By: Deon Pilling RN, BSN Entered By: Deon Pilling on 06/22/2022 15:17:02 -------------------------------------------------------------------------------- Fall Risk Assessment Details Patient Name: Date of Service: [place] [name], [name] 06/22/2022 3:00 PM Medical Record Number: [id number] Patient Account Number: [id number] Date of Birth/Sex: Treating RN: 02/09/1971 (51 y.o. [name] [name] Primary  Care Hedy Garro: [name] Other Clinician: Referring Emanuelle Bastos: Treating Cornel Werber/Extender: Delano Metz in Treatment: 0 Fall Risk Assessment Items Have you had 2 or more falls in the last 12 monthso 0 No Have you had any fall that resulted in injury in the last 12 monthso 0 No FALLS RISK SCREEN History of falling - immediate or within 3 months 0 No Secondary diagnosis (Do you have 2 or more medical diagnoseso) 0 No Ambulatory aid None/bed rest/wheelchair/nurse 0 Yes Crutches/cane/walker 0 No Furniture 0 No Intravenous therapy Access/Saline/Heparin Lock 0 No Gait/Transferring Normal/ bed rest/ wheelchair 0 Yes  Weak (short steps with or without shuffle, stooped but able to lift head while walking, may seek 0 No support from furniture) Impaired (short steps with shuffle, may have difficulty arising from chair, head down, impaired 0 No balance) Mental Status Oriented to own ability 0 Yes Electronic Signature(s) Signed: 06/22/2022 5:30:04 PM By: Deon Pilling RN, BSN Entered By: Deon Pilling on 06/22/2022 15:17:11 -------------------------------------------------------------------------------- Foot Assessment Details Patient Name: Date of Service: [name] [name], [name] 06/22/2022 3:00 PM Medical Record Number: [id number] Patient Account Number: [id number] Date of Birth/Sex: Treating RN: 09/29/1971 (51 y.o. [name] [name] Primary Care Keondra Haydu: [name] Other Clinician: Referring Christon Parada: Treating Ascher Schroepfer/Extender: Delano Metz in Treatment: 0 Foot Assessment Items Site Locations + = Sensation present, - = Sensation absent, C = Callus, U = Ulcer R = Redness, W = Warmth, M = Maceration, PU = Pre-ulcerative lesion F = Fissure, S = Swelling, D = Dryness Assessment Right: Left: Other Deformity: No No Prior Foot Ulcer: No No Prior Amputation: Yes No Charcot Joint: No No Ambulatory Status: Ambulatory Without  Help Gait: Steady Electronic Signature(s) Signed: 06/22/2022 5:30:04 PM By: Deon Pilling RN, BSN Entered By: Deon Pilling on 06/22/2022 15:17:39 -------------------------------------------------------------------------------- Nutrition Risk Screening Details Patient Name: Date of Service: [name] [name], [name] 06/22/2022 3:00 PM Medical Record Number: [id number] Patient Account Number: [id number] Date of Birth/Sex: Treating RN: 09/20/1971 (51 y.o. [name] [name] Primary Care Analiz Tvedt: [name] Other Clinician: Referring Mykal Batiz: Treating Kaili Castille/Extender: Delano Metz in Treatment: 0 Height (in): 77 Weight (lbs): 240 Body Mass Index (BMI): 28.5 Nutrition Risk Screening Items Score Screening NUTRITION RISK SCREEN: I have an illness or condition that made me change the kind and/or amount of food I eat 2 Yes I eat fewer than two meals per day 0 No I eat few fruits and vegetables, or milk products 0 No I have three or more drinks of beer, liquor or wine almost every day 0 No I have tooth or mouth problems that make it hard for me to eat 0 No I don't always have enough money to buy the food I need 0 No I eat alone most of the time 0 No I take three or more different prescribed or over-the-counter drugs a day 1 Yes Without wanting to, I have lost or gained 10 pounds in the last six months 0 No I am not always physically able to shop, cook and/or feed myself 0 No Nutrition Protocols Good Risk Protocol Provide education on elevated blood Moderate Risk Protocol 0 sugars and impact on wound healing, as applicable High Risk Proctocol Risk Level: Moderate Risk Score: 3 Electronic Signature(s) Signed: 06/22/2022 5:30:04 PM By: Deon Pilling RN, BSN Entered By: Deon Pilling on 06/22/2022 15:17:18

## 2022-06-23 NOTE — Progress Notes (Signed)
[name] [name], [name] [name] ([id number]) Visit Report for 06/22/2022 Chief Complaint Document Details Patient Name: Date of Service: [name] [name] [name] L. 06/22/2022 3:00 PM Medical Record Number: [id number] Patient Account Number: [id number] Date of Birth/Sex: Treating RN: [birth year] (51 y.o. [name] Primary Care Provider: [name] Other Clinician: Referring Provider: Treating Provider/Extender: Delano Metz in Treatment: 0 Information Obtained from: Patient Chief Complaint patient is here for a review of the wound on his plantar left first metatarsal head and back of his right leg 06/22/2022; patient presents for review of callus on the plantar aspect of the left first met head Electronic Signature(s) Signed: 06/23/2022 8:48:16 AM By: Kalman Shan DO Entered By: Kalman Shan on 06/22/2022 15:27:32 -------------------------------------------------------------------------------- HPI Details Patient Name: Date of Service: [name] ND, JA [name] L. 06/22/2022 3:00 PM Medical Record Number: [id number] Patient Account Number: [id number] Date of Birth/Sex: Treating RN: [birth year] (51 y.o. [name] Primary Care Provider: [name] Other Clinician: Referring Provider: Treating Provider/Extender: Delano Metz in Treatment: 0 History of Present Illness HPI Description: 04/21/18 ADMISSION This is a 51 year old man who is a type II diabetic. In spite of fact his hemoglobin A1c is actually quite good 5.73 months ago he is at a really difficult time over the last 6-7 months. He developed a rapidly progressive infection in the right foot in November 2018 associated with osteomyelitis and necrotizing fasciitis. 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 The stump required and a revision on 11/24/17. The stump revision was advertised as being secondary to falls. I'm not sure the progressive history here however this area is actually  closed over. The patient tells me over the same timeframe he has had wounds on the plantar aspect of his left foot. In the ED saw Dr. Sharol Given in April of this year. 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 It is really not clear that this patient is been dressing this with anything. He came in with the clinic without any specific dressing on the wound areas using his own tennis shoe. He tells me that he only ambulates of course to do a pivot transfer. He does not have his prosthesis for the right leg as of yet and he blames Medicaid for this. He does however use the foot to push himself along in his wheelchair at home. The patient has not had formal arterial studies. ABI in our clinic on the left was 1.13. Patient's past medical history includes type 2 diabetes, fracture of the right fibula. I see that he was treated for abscesses on his right buttock and chest in 2016. I did not look at the microbiology of this. 04/28/18; patient comes back in the clinic today with the wound pretty much the same as when he came in here last week. Small opening lots of undermining relatively. He tells [place] that nothing is really been on this for 3 days in spite of the fact that we gave him enough to dress this easily especially such a small wound. He says he lives with his mother, she is not capable of assisting with this he is changing the dressings himself. 05/05/18; much better-looking wound today. Smaller. There is some undermining medially however that's only perhaps 2 mm. No surrounding erythema. We've been using silver collagen 05/12/18; small wound on the first metatarsal head. No undermining no surrounding erythema. We've been using silver collagen he has home health coming out to change  05/20/18 the wound on the first metatarsal head looks better. Covered in surface debris/callus nonviable tissue. Required debridement but post debridement this looks quite good. We've  been using silver collagen. He has home health 05/27/18; first metatarsal head wound continues to improve. Just about completely closed. Still a lot of surface debridement callus. We've been using silver collagen 06/06/18; the first metatarsal wound is completely healed over. There is still a lot of callus. I gently removed some of this just to make sure that there was no open area and there is not. The patient mentions to me that his left leg has felt like "lead" for about a week READMISSION 08/16/2020 This is a 51 year old man with type 2 diabetes. He returns to clinic today with a 1 month history of a blister and callus over the first metatarsal head. This is in exactly the same area as when he was here in 2019. 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 He has standard running shoes on the left foot to match the area in his prosthesis. He has only been covering this area with a Band-Aid has not really been specifically dressing this. ABI in our clinic on the left was 1.16. This is essentially stable from the value in 2019 10/1 the patient has a linear wound over the left first metatarsal head. Again not a lot of callus thick skin around this that I removed with a #3 curette. This is not go deep to bone or muscle it does not look to be infected. 10/8 first metatarsal head. The wound looks like it is closing however this is going to be a difficult area to offload in the future. He has a size 15 shoe and he says his shoes are years old. The inserts in these current shoes are totally useless and I have advised him to replace these. He may need a new pair of shoes and I have he got original prosthesis at [facility] I have asked him to go back there. 11/9; the patient has not been here in more than a month. Not sure he was healed when he was here the last time. I told him he would have to get new shoes and certainly offload the area over the left  first metatarsal head. He has done nothing of the kind. He arrives back in clinic with the same old new balance sneakers. A very large separating callus over the first metatarsal head on the left 11/16; he has purchased new shoes and has at least 2 insoles like I asked. The wound is measuring much smaller. He is using silver alginate he changes the dressing himself 11/30; wound is measurably smaller in length of about a half a centimeter. This is generally very little depth. We have been using silver alginate. He changes the dressing himself every second day. 12/14; wound is about the same size. Significant undermining medially relative to the size of the wound. We have been using silver alginate. There is not a way to offload this area as he walks with a prosthesis on the right leg 12/28; wound is about the same size. Again he has undermining medially. I am using polymen on this 12/02/2020; the wound looks smaller. Still a senescent edge. We have been using polymen 1/24; the wound is come down a few millimeters. Still a senescent edge. I have been using polymen 2/8; the wound is come down slightly. Still with slight  undermining from 12-6 o'clock I have been using polymen partially to get offloading on this area which is opposed by his prosthesis on the other leg. 2/22 quite open improvement he still has a comma shaped wound on the left first metatarsal head. Some depth. Using polymen 3/14; he still has the same problem of a comma shaped area over the left first metatarsal head. This seems to have had more depth today at 0.6 cm. We have been using polymen. The patient changes this himself every second day. I explored the idea of a total contact cast on the left leg however this is his driving foot. He was not enamored with the idea of not being able to drive and states that he does not have anybody else to get him around 3/28; again he comes in with a smaller looking wound however there is depth and  undermining requiring debridement. We have been using Hydrofera Blue, changed to silver collagen today. The patient is doing the dressing himself 4/11; patient presents today for follow-up of his left diabetic foot wound. He denies any issues in the past 2 weeks. He is currently using silver collagen every other day with dressing changes. He does not use diabetic shoes or special inserts to his sneakers. He does not use felt pads for offloading. 4/19; patient presents for his 1 week follow-up. He denies any issues. He reports using Hydrofera Blue and has not been using silver collagen for dressing changes. He reports minimal drainage to the wound. He overall feels well. 5/3; patient presents for 2-week follow-up. He has been using silver collagen without issues. He has no complaints today and states he overall feels well. 5/17; patient presents for 2-week follow-up. He states that the sleeve is rubbing up against the back of his right leg and causing a wound. He states this happened a week ago and has not been dressing the wound. The plantar wound is stable and he has been using collagen every other day. 5/24; Patient was had a recent hospitalization for cellulitis of his right popliteal fossa. He was admitted and started on IV antibiotics. He does not recall the plantar wound dressed while inpatient. He feels a lot better today. He was discharged on 5/21 on oral antibiotics and has not picked this up until today. He reports using collagen to the plantar wound since discharge. He is keeping the right posterior knee wound covered. He is getting a new sleeve for his prostatic on 5/26. He currently denies signs of infection. 6/1; patient presents for 1 week follow-up. He is still on doxycycline. He has not been dressing the back of the right knee wound. He states he is receiving his new prosthetic sleeve tomorrow. He states he is using silver alginate to the plantar foot wound. He currently denies signs  of infection. He states he has not been offloading the left foot wound 6/7; patient presents for 1 week follow-up. He completes his last dose of doxycycline today. He finally received his prosthetic sleeve for the right side and he states it feels much better. He has been using antibiotic ointment to the back of that right knee wound. He has been using silver alginate to the plantar foot wound. He currently denies signs of infection. 6/21; patient presents for 2-week follow-up. He reports no issues with his prosthetic sleeve. He has been using antibiotic ointment to the back of the right knee. He has been using silver alginate to the plantar foot wound. He has no issues or  complaints today. He denies signs of infection. 7/5; patient presents for 2-week follow-up. He reports no issues or complaints today. He continues to use antibiotic ointment to the right posterior knee wound and silver alginate to the left plantar foot wound. He denies signs of infection. 7/19; patient presents for 2-week follow-up. He continues to use antibiotic ointment to the right posterior knee and silver alginate to the left plantar foot wound. He has no issues or complaints today. He denies signs of infection. 8/4; patient presents for 2-week follow-up. He has no issues or complaints today. He denies signs of infection. 8/18; patient presents for 2-week follow-up. He has no issues or concerns today. He has been approved for Apligraf and he would like to have this placed today. He denies signs of infection and reports that his right posterior knee wound is closed. 8/25; patient presents for 1 week follow-up. He had no issues with his first Apligraf placement last week. He did however develop an abscess to the back of his right knee. He reports tenderness to this area. He denies systemic signs of infection. 9/1; patient presents for 1 week follow-up. He has had no issues with his plantar foot wound. The Apligraf has stayed in  place over the past week. A wound culture was done at last clinic visit and a new antibiotic was sent to the pharmacy. Patient has not received this yet. He reports some mild tenderness to the back of his right knee. He denies systemic signs of infection. 9/8; patient presents for 1 week follow-up. He states he took Keflex and had diarrhea as a side effect. He was able to complete the course. He reports continued tenderness to the right posterior knee wound. He has some drainage still. He denies increased warmth or erythema to the area. He denies fever/chills or nausea/vomiting. He has no issues or complaints today about his plantar foot wound. 9/15; patient presents for follow-up. He reports improvement in pain to the back of his right leg. He does not report any drainage. He had an ultrasound completed on 9/9 that showed a superficial fluid collection concerning for abscess. We attempted to call patient with results with no answer. We did put an urgent referral to general surgery and he reports he has not received a call. He currently denies systemic signs of infection. He reports no issues to his left plantar foot wound. 9/27; patient presents for follow-up. He has no issues or complaints today. He has had no issues with the previous wound to the back of his right leg. He has not made an appointment to see general surgery. He had Apligraf placed at last clinic visit to the plantar foot wound. He currently denies signs of infection. 10/7; patient presents for follow-up. Upon entering the room there is vomit throughout the entire floor. Patient states he does not feel well. I recommended he go to urgent care. I recommended following up next week for wound care. 10/10; patient presents for follow-up. He came last week for his follow-up however he was ill and started vomiting in the exam room. Today he states he feels better however he continues to have vomiting episodes. He states he attempted to go  to urgent care/the ED but could not find a parking spot. He subsequently went home. He has no issues or complaints today regarding the foot wound. He never followed up with general surgery for the abscess identified on ultrasound to the back of his right knee. 10/20; patient presents for follow-up. He  has no issues or complaints today. Overall he feels well today. 10/27; patient presents for follow-up. He has no issues or complaints today. 11/3; patient presents for follow-up. He has no issues or complaints today. He states he does not want to follow-up with orthopedics to reevaluate the abscess in the back of his right leg. He denies signs of infection. 11/15; patient presents for follow-up. He has no issues or complaints today. He had been using silver alginate to the left first met head wound. He denies signs of infection. 11/22; patient presents for follow-up. He had Affinity placed at last clinic visit. He has no issues or complaints today. 11/29; patient presents for follow-up. He is in good spirits today. He has no issues or complaints today. 12/6; patient presents for follow-up. He has no issues or complaints today. 12/13; patient presents for follow-up. He denies signs of infection. He has no issues or complaints today. 12/20; patient presents for follow-up. He has no issues or complaints today. He denies signs of infection. 12/29; patient presents for follow-up. He states that a few days ago he has had reaccumulation of an abscess behind the right knee. He denies increased warmth or erythema to the area. He denies fever/chills or drainage. 1/6; patient presents for follow-up. He states he went to the [facility] emergency department to be evaluated for the abscess to his right posterior knee. He was started on clindamycin. They were unable to drain the abscess. Patient states that eventually the abscess ruptured. He reports improvement in his symptoms. He has no issues or complaints  about his left foot wound. 1/17; patient presents for follow-up. He has no issues or complaints today. 1/24; patient presents for follow-up. He has no issues or complaints today. He denies signs of infection. 1/31; patient presents for follow-up. He has been approved for Affinity however has a 20% co-pay. Patient has declined further Affinity treatments due to this. He currently denies signs of infection. 2/7; patient presents for follow-up. He has no issues or complaints today. He denies signs of infection. He has been doing Hydrofera Blue dressing changes. 2/14; patient presents for follow-up. He has no issues or complaints today. He has been using blast X with collagen to the wound bed. He currently denies any issues. 2/28; patient presents for follow-up. He denies signs of infection. He reports using blast X with collagen to the wound bed. 3/7; patient presents for follow-up. He states he has been using gentamicin ointment and urea cream with dressing changes. He reports that the back of his right knee has developed an abscess again and is draining. He states this started today. 02/03/2022: The patient came to clinic today without any dressing on his foot. He says that he took a shower yesterday and felt like he did not need a dressing and so he did not apply it. He is not offloading this foot and is wearing a regular athletic shoe. 3/21; patient presents for follow-up. He did not come in with a dressing on his wound. He states he has been using gentamicin ointment daily. He has a Pegasys insert to his tennis shoe for offloading. He currently denies signs of infection. 3/28; patient presents for follow-up. He reports using gentamicin ointment to the wound bed. He has no issues or complaints today. 5/30/2023consult Patient presents because he states he tried to remove the callus on his plantar left foot and thinks he created a wound. He has been keeping the area covered. He denies  drainage. 06/22/2022 Patient presents  for review of callus to the plantar left foot. He denies any drainage or open wounds to the area. Electronic Signature(s) Signed: 06/23/2022 8:48:16 AM By: Kalman Shan DO Entered By: Kalman Shan on 06/22/2022 15:29:00 -------------------------------------------------------------------------------- Physical Exam Details Patient Name: Date of Service: [name] ND, JA [name] L. 06/22/2022 3:00 PM Medical Record Number: [id number] Patient Account Number: [id number] Date of Birth/Sex: Treating RN: Mar 08, 1971 (51 y.o. [name] Primary Care Provider: [name] Other Clinician: Referring Provider: Treating Provider/Extender: Delano Metz in Treatment: 0 Constitutional respirations regular, non-labored and within target range for patient.. Cardiovascular 2+ dorsalis pedis/posterior tibialis pulses. Psychiatric pleasant and cooperative. Notes Left foot: T the plantar aspect there is a thick hardened callus to the first met head. No open wounds. No drainage noted. o Electronic Signature(s) Signed: 06/23/2022 8:48:16 AM By: Kalman Shan DO Entered By: Kalman Shan on 06/22/2022 15:29:40 -------------------------------------------------------------------------------- Physician Orders Details Patient Name: Date of Service: [name] ND, JA [name] L. 06/22/2022 3:00 PM Medical Record Number: [id number] Patient Account Number: [id number] Date of Birth/Sex: Treating RN: 1971-02-22 (51 y.o. [name] Primary Care Provider: [name] Other Clinician: Referring Provider: Treating Provider/Extender: Delano Metz in Treatment: 0 Verbal / Phone Orders: No Diagnosis Coding ICD-10 Coding Code Description E11.40 Type 2 diabetes mellitus with diabetic neuropathy, unspecified L84 Corns and callosities Z89.511 Acquired absence of right leg below knee Discharge From [facility]  Services Discharge from [facility] - Call if any future wound care needs. Referring you to Podiatry at [facility] related to callous trimming, foot care, orthotic shoes, and insoles. Call their office if not heard from [facility] at [phone]. If no referral received call our office. Consults Podiatry - Referring you to Podiatry at [facility] related to callous trimming, foot care, orthotic shoes, and insoles. ICD 10 code L84. - (ICD10 E11.40 - Type 2 diabetes mellitus with diabetic neuropathy, unspecified) Electronic Signature(s) Signed: 06/23/2022 8:48:16 AM By: Kalman Shan DO Entered By: Kalman Shan on 06/22/2022 15:29:47 Prescription 06/22/2022 -------------------------------------------------------------------------------- Strothers, Curry L. Kalman Shan DO Patient Name: Provider: 1971-01-28 [id number] Date of Birth: NPI#: [name] [id number] Sex: DEA #: [phone] Phone #: License #: [place] Patient Address: [address], [place] [address] [name], [place] [address] [phone] Allergies adhesive tape; [employer] Provider's Orders Podiatry - ICD10: E11.40 - Referring you to Podiatry at [facility] related to callous trimming, foot care, orthotic shoes, and insoles. ICD 10 code L84. Hand Signature: Date(s): Electronic Signature(s) Signed: 06/23/2022 8:48:16 AM By: Kalman Shan DO Entered By: Kalman Shan on 06/22/2022 15:29:47 -------------------------------------------------------------------------------- Problem List Details Patient Name: Date of Service: [name] ND, JA [name] L. 06/22/2022 3:00 PM Medical Record Number: [id number] Patient Account Number: [id number] Date of Birth/Sex: Treating RN: 06-Oct-1971 (50 y.o. [name] Primary Care Provider: [name] Other Clinician: Referring Provider: Treating  Provider/Extender: Delano Metz in Treatment: 0 Active Problems ICD-10 Encounter Code Description Active Date MDM Diagnosis E11.40 Type 2 diabetes mellitus with diabetic neuropathy, unspecified 06/22/2022 No Yes L84 Corns and callosities 06/22/2022 No Yes Z89.511 Acquired absence of right leg below knee 06/22/2022 No Yes Inactive Problems Resolved Problems Electronic Signature(s) Signed: 06/23/2022 8:48:16 AM By: Kalman Shan DO Entered By: Kalman Shan on 06/22/2022 15:27:02 -------------------------------------------------------------------------------- Progress Note Details Patient Name: Date of Service: [name] ND, JA [name] L. 06/22/2022 3:00 PM Medical Record Number:  [id number] Patient Account Number: [id number] Date of Birth/Sex: Treating RN: 1971/01/26 (51 y.o. [name] Primary Care Provider: [name] Other Clinician: Referring Provider: Treating Provider/Extender: Delano Metz in Treatment: 0 Subjective Chief Complaint Information obtained from Patient patient is here for a review of the wound on his plantar left first metatarsal head and back of his right leg 06/22/2022; patient presents for review of callus on the plantar aspect of the left first met head History of Present Illness (HPI) 04/21/18 ADMISSION This is a 51 year old man who is a type II diabetic. In spite of fact his hemoglobin A1c is actually quite good 5.73 months ago he is at a really difficult time over the last 6-7 months. He developed a rapidly progressive infection in the right foot in November 2018 associated with osteomyelitis and necrotizing fasciitis. 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 The stump required and a revision on 11/24/17. The stump revision was advertised as being secondary to falls. I'm not sure the progressive history here however this area is actually closed over. The patient tells me over the same timeframe he has had  wounds on the plantar aspect of his left foot. In the ED saw Dr. Sharol Given in April of this year. 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 It is really not clear that this patient is been dressing this with anything. He came in with the clinic without any specific dressing on the wound areas using his own tennis shoe. He tells me that he only ambulates of course to do a pivot transfer. He does not have his prosthesis for the right leg as of yet and he blames Medicaid for this. He does however use the foot to push himself along in his wheelchair at home. The patient has not had formal arterial studies. ABI in our clinic on the left was 1.13. Patient's past medical history includes type 2 diabetes, fracture of the right fibula. I see that he was treated for abscesses on his right buttock and chest in 2016. I did not look at the microbiology of this. 04/28/18; patient comes back in the clinic today with the wound pretty much the same as when he came in here last week. Small opening lots of undermining relatively. He tells [place] that nothing is really been on this for 3 days in spite of the fact that we gave him enough to dress this easily especially such a small wound. He says he lives with his mother, she is not capable of assisting with this he is changing the dressings himself. 05/05/18; much better-looking wound today. Smaller. There is some undermining medially however that's only perhaps 2 mm. No surrounding erythema. We've been using silver collagen 05/12/18; small wound on the first metatarsal head. No undermining no surrounding erythema. We've been using silver collagen he has home health coming out to change 05/20/18 the wound on the first metatarsal head looks better. Covered in surface debris/callus nonviable tissue. Required debridement but post debridement this looks quite good. We've been using silver collagen. He has home health 05/27/18; first  metatarsal head wound continues to improve. Just about completely closed. Still a lot of surface debridement callus. We've been using silver collagen 06/06/18; the first metatarsal wound is completely healed over. There is still a lot of callus. I gently removed some of this just to make sure that there was no open area and there is not. The patient mentions to  me that his left leg has felt like "lead" for about a week READMISSION 08/16/2020 This is a 51 year old man with type 2 diabetes. He returns to clinic today with a 1 month history of a blister and callus over the first metatarsal head. This is in exactly the same area as when he was here in 2019. 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 He has standard running shoes on the left foot to match the area in his prosthesis. He has only been covering this area with a Band-Aid has not really been specifically dressing this. ABI in our clinic on the left was 1.16. This is essentially stable from the value in 2019 10/1 the patient has a linear wound over the left first metatarsal head. Again not a lot of callus thick skin around this that I removed with a #3 curette. This is not go deep to bone or muscle it does not look to be infected. 10/8 first metatarsal head. The wound looks like it is closing however this is going to be a difficult area to offload in the future. He has a size 15 shoe and he says his shoes are years old. The inserts in these current shoes are totally useless and I have advised him to replace these. He may need a new pair of shoes and I have he got original prosthesis at [facility] [place] I have asked him to go back there. 11/9; the patient has not been here in more than a month. Not sure he was healed when he was here the last time. I told him he would have to get new shoes and certainly offload the area over the left first metatarsal head. He has done nothing of the kind. He  arrives back in clinic with the same old new balance sneakers. A very large separating callus over the first metatarsal head on the left 11/16; he has purchased new shoes and has at least 2 insoles like I asked. The wound is measuring much smaller. He is using silver alginate he changes the dressing himself 11/30; wound is measurably smaller in length of about a half a centimeter. This is generally very little depth. We have been using silver alginate. He changes the dressing himself every second day. 12/14; wound is about the same size. Significant undermining medially relative to the size of the wound. We have been using silver alginate. There is not a way to offload this area as he walks with a prosthesis on the right leg 12/28; wound is about the same size. Again he has undermining medially. I am using polymen on this 12/02/2020; the wound looks smaller. Still a senescent edge. We have been using polymen 1/24; the wound is come down a few millimeters. Still a senescent edge. I have been using polymen 2/8; the wound is come down slightly. Still with slight undermining from 12-6 o'clock I have been using polymen partially to get offloading on this area which is opposed by his prosthesis on the other leg. 2/22 quite open improvement he still has a comma shaped wound on the left first metatarsal head. Some depth. Using polymen 3/14; he still has the same problem of a comma shaped area over the left first metatarsal head. This seems to have had more depth today at 0.6 cm. We have been using polymen. The patient changes this himself every second day. I explored the idea of a total contact cast on  the left leg however this is his driving foot. He was not enamored with the idea of not being able to drive and states that he does not have anybody else to get him around 3/28; again he comes in with a smaller looking wound however there is depth and undermining requiring debridement. We have been using  Hydrofera Blue, changed to silver collagen today. The patient is doing the dressing himself 4/11; patient presents today for follow-up of his left diabetic foot wound. He denies any issues in the past 2 weeks. He is currently using silver collagen every other day with dressing changes. He does not use diabetic shoes or special inserts to his sneakers. He does not use felt pads for offloading. 4/19; patient presents for his 1 week follow-up. He denies any issues. He reports using Hydrofera Blue and has not been using silver collagen for dressing changes. He reports minimal drainage to the wound. He overall feels well. 5/3; patient presents for 2-week follow-up. He has been using silver collagen without issues. He has no complaints today and states he overall feels well. 5/17; patient presents for 2-week follow-up. He states that the sleeve is rubbing up against the back of his right leg and causing a wound. He states this happened a week ago and has not been dressing the wound. The plantar wound is stable and he has been using collagen every other day. 5/24; Patient was had a recent hospitalization for cellulitis of his right popliteal fossa. He was admitted and started on IV antibiotics. He does not recall the plantar wound dressed while inpatient. He feels a lot better today. He was discharged on 5/21 on oral antibiotics and has not picked this up until today. He reports using collagen to the plantar wound since discharge. He is keeping the right posterior knee wound covered. He is getting a new sleeve for his prostatic on 5/26. He currently denies signs of infection. 6/1; patient presents for 1 week follow-up. He is still on doxycycline. He has not been dressing the back of the right knee wound. He states he is receiving his new prosthetic sleeve tomorrow. He states he is using silver alginate to the plantar foot wound. He currently denies signs of infection. He states he has not been offloading the  left foot wound 6/7; patient presents for 1 week follow-up. He completes his last dose of doxycycline today. He finally received his prosthetic sleeve for the right side and he states it feels much better. He has been using antibiotic ointment to the back of that right knee wound. He has been using silver alginate to the plantar foot wound. He currently denies signs of infection. 6/21; patient presents for 2-week follow-up. He reports no issues with his prosthetic sleeve. He has been using antibiotic ointment to the back of the right knee. He has been using silver alginate to the plantar foot wound. He has no issues or complaints today. He denies signs of infection. 7/5; patient presents for 2-week follow-up. He reports no issues or complaints today. He continues to use antibiotic ointment to the right posterior knee wound and silver alginate to the left plantar foot wound. He denies signs of infection. 7/19; patient presents for 2-week follow-up. He continues to use antibiotic ointment to the right posterior knee and silver alginate to the left plantar foot wound. He has no issues or complaints today. He denies signs of infection. 8/4; patient presents for 2-week follow-up. He has no issues or complaints today. He denies  signs of infection. 8/18; patient presents for 2-week follow-up. He has no issues or concerns today. He has been approved for Apligraf and he would like to have this placed today. He denies signs of infection and reports that his right posterior knee wound is closed. 8/25; patient presents for 1 week follow-up. He had no issues with his first Apligraf placement last week. He did however develop an abscess to the back of his right knee. He reports tenderness to this area. He denies systemic signs of infection. 9/1; patient presents for 1 week follow-up. He has had no issues with his plantar foot wound. The Apligraf has stayed in place over the past week. A wound culture was done at  last clinic visit and a new antibiotic was sent to the pharmacy. Patient has not received this yet. He reports some mild tenderness to the back of his right knee. He denies systemic signs of infection. 9/8; patient presents for 1 week follow-up. He states he took Keflex and had diarrhea as a side effect. He was able to complete the course. He reports continued tenderness to the right posterior knee wound. He has some drainage still. He denies increased warmth or erythema to the area. He denies fever/chills or nausea/vomiting. He has no issues or complaints today about his plantar foot wound. 9/15; patient presents for follow-up. He reports improvement in pain to the back of his right leg. He does not report any drainage. He had an ultrasound completed on 9/9 that showed a superficial fluid collection concerning for abscess. We attempted to call patient with results with no answer. We did put an urgent referral to general surgery and he reports he has not received a call. He currently denies systemic signs of infection. He reports no issues to his left plantar foot wound. 9/27; patient presents for follow-up. He has no issues or complaints today. He has had no issues with the previous wound to the back of his right leg. He has not made an appointment to see general surgery. He had Apligraf placed at last clinic visit to the plantar foot wound. He currently denies signs of infection. 10/7; patient presents for follow-up. Upon entering the room there is vomit throughout the entire floor. Patient states he does not feel well. I recommended he go to urgent care. I recommended following up next week for wound care. 10/10; patient presents for follow-up. He came last week for his follow-up however he was ill and started vomiting in the exam room. Today he states he feels better however he continues to have vomiting episodes. He states he attempted to go to urgent care/the ED but could not find a parking spot.  He subsequently went home. He has no issues or complaints today regarding the foot wound. He never followed up with general surgery for the abscess identified on ultrasound to the back of his right knee. 10/20; patient presents for follow-up. He has no issues or complaints today. Overall he feels well today. 10/27; patient presents for follow-up. He has no issues or complaints today. 11/3; patient presents for follow-up. He has no issues or complaints today. He states he does not want to follow-up with orthopedics to reevaluate the abscess in the back of his right leg. He denies signs of infection. 11/15; patient presents for follow-up. He has no issues or complaints today. He had been using silver alginate to the left first met head wound. He denies signs of infection. 11/22; patient presents for follow-up. He had Affinity  placed at last clinic visit. He has no issues or complaints today. 11/29; patient presents for follow-up. He is in good spirits today. He has no issues or complaints today. 12/6; patient presents for follow-up. He has no issues or complaints today. 12/13; patient presents for follow-up. He denies signs of infection. He has no issues or complaints today. 12/20; patient presents for follow-up. He has no issues or complaints today. He denies signs of infection. 12/29; patient presents for follow-up. He states that a few days ago he has had reaccumulation of an abscess behind the right knee. He denies increased warmth or erythema to the area. He denies fever/chills or drainage. 1/6; patient presents for follow-up. He states he went to the [place] [facility] emergency department to be evaluated for the abscess to his right posterior knee. He was started on clindamycin. They were unable to drain the abscess. Patient states that eventually the abscess ruptured. He reports improvement in his symptoms. He has no issues or complaints about his left foot wound. 1/17; patient presents for  follow-up. He has no issues or complaints today. 1/24; patient presents for follow-up. He has no issues or complaints today. He denies signs of infection. 1/31; patient presents for follow-up. He has been approved for Affinity however has a 20% co-pay. Patient has declined further Affinity treatments due to this. He currently denies signs of infection. 2/7; patient presents for follow-up. He has no issues or complaints today. He denies signs of infection. He has been doing Hydrofera Blue dressing changes. 2/14; patient presents for follow-up. He has no issues or complaints today. He has been using blast X with collagen to the wound bed. He currently denies any issues. 2/28; patient presents for follow-up. He denies signs of infection. He reports using blast X with collagen to the wound bed. 3/7; patient presents for follow-up. He states he has been using gentamicin ointment and urea cream with dressing changes. He reports that the back of his right knee has developed an abscess again and is draining. He states this started today. 02/03/2022: The patient came to clinic today without any dressing on his foot. He says that he took a shower yesterday and felt like he did not need a dressing and so he did not apply it. He is not offloading this foot and is wearing a regular athletic shoe. 3/21; patient presents for follow-up. He did not come in with a dressing on his wound. He states he has been using gentamicin ointment daily. He has a Pegasys insert to his tennis shoe for offloading. He currently denies signs of infection. 3/28; patient presents for follow-up. He reports using gentamicin ointment to the wound bed. He has no issues or complaints today. 04/21/2022ooconsult Patient presents because he states he tried to remove the callus on his plantar left foot and thinks he created a wound. He has been keeping the area covered. He denies drainage. 06/22/2022 Patient presents for review of callus to the  plantar left foot. He denies any drainage or open wounds to the area. Patient History Information obtained from Patient, Chart. Allergies adhesive tape (Severity: Mild, Reaction: rash), Fer-Iron Family History Diabetes - Maternal Grandparents,Mother, Heart Disease - Father, Hypertension - Father, No family history of Cancer, Hereditary Spherocytosis, Kidney Disease, Lung Disease, Seizures, Stroke, Thyroid Problems, Tuberculosis. Social History Current every day smoker, Marital Status - Single, Alcohol Use - Never, Drug Use - No History, Caffeine Use - Never. Medical History Eyes Denies history of Cataracts, Glaucoma, Optic Neuritis Ear/Nose/Mouth/Throat Denies history  of Chronic sinus problems/congestion, Middle ear problems Hematologic/Lymphatic Patient has history of Anemia Denies history of Hemophilia, Human Immunodeficiency Virus, Lymphedema, Sickle Cell Disease Respiratory Denies history of Aspiration, Asthma, Chronic Obstructive Pulmonary Disease (COPD), Pneumothorax, Sleep Apnea, Tuberculosis Cardiovascular Patient has history of Hypertension, Peripheral Arterial Disease, Peripheral Venous Disease Denies history of Angina, Arrhythmia, Congestive Heart Failure, Coronary Artery Disease, Deep Vein Thrombosis, Hypotension, Myocardial Infarction, Phlebitis, Vasculitis Gastrointestinal Denies history of Cirrhosis , Colitis, Crohnoos, Hepatitis A, Hepatitis B, Hepatitis C Endocrine Patient has history of Type II Diabetes - oral meds Denies history of Type I Diabetes Genitourinary Denies history of End Stage Renal Disease Immunological Denies history of Lupus Erythematosus, Raynaudoos, Scleroderma Integumentary (Skin) Denies history of History of Burn Musculoskeletal Denies history of Gout, Rheumatoid Arthritis, Osteoarthritis, Osteomyelitis Neurologic Patient has history of Neuropathy Denies history of Dementia, Quadriplegia, Paraplegia, Seizure Disorder Oncologic Denies  history of Received Chemotherapy, Received Radiation Psychiatric Denies history of Anorexia/bulimia, Confinement Anxiety Hospitalization/Surgery History - fever 105, sepsis cellulitis right popliteal fossa 04/09/2021. - 2018 necrotizing fasciitis. Medical A Surgical History Notes nd Constitutional Symptoms (General Health) falls , leukocytosis Respiratory During waking hours and catches himself not breathing, "gasp for air". Saw Dr Wynonia Lawman, he couldn't find a reason Cardiovascular hyperlipidemia Gastrointestinal GERD Genitourinary stage IV CKD Objective Constitutional respirations regular, non-labored and within target range for patient.. Vitals Time Taken: 3:15 PM, Height: 77 in, Source: Stated, Weight: 240 lbs, Source: Stated, BMI: 28.5, Temperature: 99.3 F, Pulse: 94 bpm, Respiratory Rate: 20 breaths/min, Blood Pressure: 143/81 mmHg, Capillary Blood Glucose: 187 mg/dl. Cardiovascular 2+ dorsalis pedis/posterior tibialis pulses. Psychiatric pleasant and cooperative. General Notes: Left foot: T the plantar aspect there is a thick hardened callus to the first met head. No open wounds. No drainage noted. o Assessment Active Problems ICD-10 Type 2 diabetes mellitus with diabetic neuropathy, unspecified Corns and callosities Acquired absence of right leg below knee Patient has a thick hardened callus to the plantar aspect of the left foot. I think this would be best addressed by podiatry for removal. We will place a referral. He will also need orthotics. He has no open wounds. He may follow-up as needed. Plan Discharge From [facility] Services: Discharge from [facility] - Call if any future wound care needs. Referring you to Podiatry at [facility] related to callous trimming, foot care, orthotic shoes, and insoles. Call their office if not heard from [facility] at [phone]. If no referral received call our office. Consults ordered were: Podiatry -  Referring you to [facility]iatry at [facility] related to callous trimming, foot care, orthotic shoes, and insoles. ICD 10 code L84. 1. Referral to podiatry 2. Orthotics 3. Follow-up as needed Electronic Signature(s) Signed: 06/23/2022 8:48:16 AM By: Kalman Shan DO Entered By: Kalman Shan on 06/22/2022 15:30:57 -------------------------------------------------------------------------------- HxROS Details Patient Name: Date of Service: [name] ND, JA [name] L. 06/22/2022 3:00 PM Medical Record Number: [id number] Patient Account Number: [id number] Date of Birth/Sex: Treating RN: 1971-08-31 (51 y.o. [name] Primary Care Provider: [name] Other Clinician: Referring Provider: Treating Provider/Extender: Delano Metz in Treatment: 0 Information Obtained From Patient Chart Constitutional Symptoms (General Health) Medical History: Past Medical History Notes: falls , leukocytosis Eyes Medical History: Negative for: Cataracts; Glaucoma; Optic Neuritis Ear/Nose/Mouth/Throat Medical History: Negative for: Chronic sinus problems/congestion; Middle ear problems Hematologic/Lymphatic Medical History: Positive for: Anemia Negative for: Hemophilia; Human Immunodeficiency Virus; Lymphedema; Sickle Cell Disease Respiratory Medical History: Negative for: Aspiration; Asthma; Chronic  Obstructive Pulmonary Disease (COPD); Pneumothorax; Sleep Apnea; Tuberculosis Past Medical History Notes: During waking hours and catches himself not breathing, "gasp for air". Saw Dr Wynonia Lawman, he couldn't find a reason Cardiovascular Medical History: Positive for: Hypertension; Peripheral Arterial Disease; Peripheral Venous Disease Negative for: Angina; Arrhythmia; Congestive Heart Failure; Coronary Artery Disease; Deep Vein Thrombosis; Hypotension; Myocardial Infarction; Phlebitis; Vasculitis Past Medical History Notes: hyperlipidemia Gastrointestinal Medical  History: Negative for: Cirrhosis ; Colitis; Crohns; Hepatitis A; Hepatitis B; Hepatitis C Past Medical History Notes: GERD Endocrine Medical History: Positive for: Type II Diabetes - oral meds Negative for: Type I Diabetes Time with diabetes: 2014 Treated with: Insulin, Oral agents Blood sugar tested every day: Yes Tested : CGM Blood sugar testing results: Breakfast: 91 Genitourinary Medical History: Negative for: End Stage Renal Disease Past Medical History Notes: stage IV CKD Immunological Medical History: Negative for: Lupus Erythematosus; Raynauds; Scleroderma Integumentary (Skin) Medical History: Negative for: History of Burn Musculoskeletal Medical History: Negative for: Gout; Rheumatoid Arthritis; Osteoarthritis; Osteomyelitis Neurologic Medical History: Positive for: Neuropathy Negative for: Dementia; Quadriplegia; Paraplegia; Seizure Disorder Oncologic Medical History: Negative for: Received Chemotherapy; Received Radiation Psychiatric Medical History: Negative for: Anorexia/bulimia; Confinement Anxiety Immunizations Pneumococcal Vaccine: Received Pneumococcal Vaccination: No Implantable Devices None Hospitalization / Surgery History Type of Hospitalization/Surgery fever 105, sepsis cellulitis right popliteal fossa 04/09/2021 2018 necrotizing fasciitis Family and Social History Cancer: No; Diabetes: Yes - Maternal Grandparents,Mother; Heart Disease: Yes - Father; Hereditary Spherocytosis: No; Hypertension: Yes - Father; Kidney Disease: No; Lung Disease: No; Seizures: No; Stroke: No; Thyroid Problems: No; Tuberculosis: No; Current every day smoker; Marital Status - Single; Alcohol Use: Never; Drug Use: No History; Caffeine Use: Never; Financial Concerns: No; Food, Clothing or Shelter Needs: No; Support System Lacking: No; Transportation Concerns: No Electronic Signature(s) Signed: 06/22/2022 5:30:04 PM By: Deon Pilling RN, BSN Signed: 06/23/2022 8:48:16 AM  By: Kalman Shan DO Entered By: Deon Pilling on 06/20/2022 08:33:34 -------------------------------------------------------------------------------- SuperBill Details Patient Name: Date of Service: [name], JA [name] L. 06/22/2022 Medical Record Number: [id number] Patient Account Number: [id number] Date of Birth/Sex: Treating RN: 1970/12/26 (51 y.o. [name] Primary Care Provider: [name] Other Clinician: Referring Provider: Treating Provider/Extender: Delano Metz in Treatment: 0 Diagnosis Coding ICD-10 Codes Code Description E11.40 Type 2 diabetes mellitus with diabetic neuropathy, unspecified L84 Corns and callosities Z89.511 Acquired absence of right leg below knee Facility Procedures CPT4 Code: [id number] Description: 99213 - WOUND CARE VISIT-LEV 3 EST PT Modifier: Quantity: 1 Physician Procedures : CPT4 Code Description Modifier [id number] - WC PHYS LEVEL 3 - EST PT ICD-10 Diagnosis Description E11.40 Type 2 diabetes mellitus with diabetic neuropathy, unspecified L84 Corns and callosities Z89.511 Acquired absence of right leg below knee Quantity: 1 Electronic Signature(s) Signed: 06/22/2022 5:30:04 PM By: Deon Pilling RN, BSN Signed: 06/23/2022 8:48:16 AM By: Kalman Shan DO Entered By: Deon Pilling on 06/22/2022 17:07:35

## 2022-06-23 NOTE — Progress Notes (Signed)
[name], [name] ([id number]) Visit Report for 06/22/2022 Allergy List Details Patient Name: Date of Service: [name] [name] [name] 06/22/2022 3:00 PM Medical Record Number: [id number] Patient Account Number: [id number] Date of Birth/Sex: Treating RN: September 04, 1971 (51 y.o. [name] [name] Primary Care Silas Sedam: [name] Other Clinician: Referring Adarian Bur: Treating Shy Guallpa/Extender: Delano Metz in Treatment: 0 Allergies Active Allergies adhesive tape Reaction: rash Severity: Mild Fer-Iron Allergy Notes Electronic Signature(s) Signed: 06/22/2022 5:30:04 PM By: Deon Pilling RN, BSN Entered By: Deon Pilling on 06/22/2022 15:16:16 -------------------------------------------------------------------------------- Arrival Information Details Patient Name: Date of Service: [name], [name] 06/22/2022 3:00 PM Medical Record Number: [id number] Patient Account Number: [id number] Date of Birth/Sex: Treating RN: 1971-07-05 (51 y.o. [name], [name] Primary Care Dajuana Palen: [name] Other Clinician: Referring Natthew Marlatt: Treating Bralin Garry/Extender: Delano Metz in Treatment: 0 Visit Information Patient Arrived: Ambulatory Arrival Time: 15:14 Accompanied By: self Transfer Assistance: None Patient Identification Verified: Yes Secondary Verification Process Completed: Yes Patient Requires Transmission-Based Precautions: No Patient Has Alerts: No History Since Last Visit Added or deleted any medications: No Any new allergies or adverse reactions: No Had a fall or experienced change in activities of daily living that may affect risk of falls: No Signs or symptoms of abuse/neglect since last visito No Hospitalized since last visit: No Implantable device outside of the clinic excluding cellular tissue based products placed in the center since last visit: No Has Dressing in Place as Prescribed: Yes Has Footwear/Offloading  in Place as Prescribed: No Left: Other:regular shoe Pain Present Now: Yes Electronic Signature(s) Signed: 06/22/2022 5:30:04 PM By: Deon Pilling RN, BSN Entered By: Deon Pilling on 06/22/2022 15:15:05 -------------------------------------------------------------------------------- Clinic Level of Care Assessment Details Patient Name: Date of Service: [name] ND, [name] 06/22/2022 3:00 PM Medical Record Number: [id number] Patient Account Number: [id number] Date of Birth/Sex: Treating RN: 19-Jul-1971 (51 y.o. [name] [name] Primary Care Jaimee Corum: [name] Other Clinician: Referring Zakiah Gauthreaux: Treating Jennavecia Schwier/Extender: Delano Metz in Treatment: 0 Clinic Level of Care Assessment Items TOOL 2 Quantity Score X- 1 0 Use when only an EandM is performed on the INITIAL visit ASSESSMENTS - Nursing Assessment / Reassessment X- 1 20 General Physical Exam (combine w/ comprehensive assessment (listed just below) when performed on new pt. evals) X- 1 25 Comprehensive Assessment (HX, ROS, Risk Assessments, Wounds Hx, etc.) ASSESSMENTS - Wound and Skin A ssessment / Reassessment '[]'$  - 0 Simple Wound Assessment / Reassessment - one wound '[]'$  - 0 Complex Wound Assessment / Reassessment - multiple wounds X- 1 10 Dermatologic / Skin Assessment (not related to wound area) ASSESSMENTS - Ostomy and/or Continence Assessment and Care '[]'$  - 0 Incontinence Assessment and Management '[]'$  - 0 Ostomy Care Assessment and Management (repouching, etc.) PROCESS - Coordination of Care X - Simple Patient / Family Education for ongoing care 1 15 '[]'$  - 0 Complex (extensive) Patient / Family Education for ongoing care X- 1 10 Staff obtains Programmer, systems, Records, T Results / Process Orders est '[]'$  - 0 Staff telephones HHA, Nursing Homes / Clarify orders / etc '[]'$  - 0 Routine Transfer to another Facility (non-emergent condition) '[]'$  - 0 Routine Hospital Admission (non-emergent  condition) X- 1 15 New Admissions / Biomedical engineer / Ordering NPWT Apligraf, etc. , '[]'$  - 0 Emergency Hospital Admission (emergent condition) X- 1 10 Simple Discharge Coordination '[]'$  - 0 Complex (extensive) Discharge Coordination PROCESS - Special Needs '[]'$  - 0 Pediatric / Minor Patient Management '[]'$  - 0 Isolation Patient  Management '[]'$  - 0 Hearing / Language / Visual special needs '[]'$  - 0 Assessment of Community assistance (transportation, D/C planning, etc.) '[]'$  - 0 Additional assistance / Altered mentation '[]'$  - 0 Support Surface(s) Assessment (bed, cushion, seat, etc.) INTERVENTIONS - Wound Cleansing / Measurement '[]'$  - 0 Wound Imaging (photographs - any number of wounds) '[]'$  - 0 Wound Tracing (instead of photographs) '[]'$  - 0 Simple Wound Measurement - one wound '[]'$  - 0 Complex Wound Measurement - multiple wounds '[]'$  - 0 Simple Wound Cleansing - one wound '[]'$  - 0 Complex Wound Cleansing - multiple wounds INTERVENTIONS - Wound Dressings '[]'$  - 0 Small Wound Dressing one or multiple wounds '[]'$  - 0 Medium Wound Dressing one or multiple wounds '[]'$  - 0 Large Wound Dressing one or multiple wounds '[]'$  - 0 Application of Medications - injection INTERVENTIONS - Miscellaneous '[]'$  - 0 External ear exam '[]'$  - 0 Specimen Collection (cultures, biopsies, blood, body fluids, etc.) '[]'$  - 0 Specimen(s) / Culture(s) sent or taken to Lab for analysis '[]'$  - 0 Patient Transfer (multiple staff / Civil Service fast streamer / Similar devices) '[]'$  - 0 Simple Staple / Suture removal (25 or less) '[]'$  - 0 Complex Staple / Suture removal (26 or more) '[]'$  - 0 Hypo / Hyperglycemic Management (close monitor of Blood Glucose) '[]'$  - 0 Ankle / Brachial Index (ABI) - do not check if billed separately Has the patient been seen at the hospital within the last three years: Yes Total Score: 105 Level Of Care: New/Established - Level 3 Electronic Signature(s) Signed: 06/22/2022 5:30:04 PM By: Deon Pilling RN,  BSN Entered By: Deon Pilling on 06/22/2022 17:07:30 -------------------------------------------------------------------------------- Encounter Discharge Information Details Patient Name: Date of Service: [name], [name] 06/22/2022 3:00 PM Medical Record Number: [id number] Patient Account Number: [id number] Date of Birth/Sex: Treating RN: 02-Dec-1970 (51 y.o. [name] [name] Primary Care Taji Sather: [name] Other Clinician: Referring Lola Lofaro: Treating Bliss Behnke/Extender: Delano Metz in Treatment: 0 Encounter Discharge Information Items Discharge Condition: Stable Ambulatory Status: Ambulatory Discharge Destination: Home Transportation: Private Auto Accompanied By: self Schedule Follow-up Appointment: No Clinical Summary of Care: Electronic Signature(s) Signed: 06/22/2022 5:30:04 PM By: Deon Pilling RN, BSN Entered By: Deon Pilling on 06/22/2022 17:07:50 -------------------------------------------------------------------------------- Lower Extremity Assessment Details Patient Name: Date of Service: [name], [name] 06/22/2022 3:00 PM Medical Record Number: [id number] Patient Account Number: [id number] Date of Birth/Sex: Treating RN: June 27, 1971 (51 y.o. [name] [name] Primary Care Adelaida Reindel: [name] Other Clinician: Referring Tyreak Reagle: Treating Keyleigh Manninen/Extender: Delano Metz in Treatment: 0 Edema Assessment Assessed: [name]: Yes] [name]: No] Edema: [Left: Ye] [Right: s] Calf Left: Right: Point of Measurement: 38 cm From Medial Instep 41 cm Ankle Left: Right: Point of Measurement: 11 cm From Medial Instep 24 cm Knee To Floor Left: Right: From Medial Instep 53 cm Vascular Assessment Pulses: Dorsalis Pedis Palpable: [Left:Yes] Electronic Signature(s) Signed: 06/22/2022 5:30:04 PM By: Deon Pilling RN, BSN Entered By: Deon Pilling on 06/22/2022  15:19:59 -------------------------------------------------------------------------------- Multi Wound Chart Details Patient Name: Date of Service: [name], [name] 06/22/2022 3:00 PM Medical Record Number: [id number] Patient Account Number: [id number] Date of Birth/Sex: Treating RN: [birth year] (51 y.o. [name] [name] Primary Care Zari Cly: [name] Other Clinician: Referring Dray Dente: Treating Roan Sawchuk/Extender: Delano Metz in Treatment: 0 Vital Signs Height(in): 77 Capillary Blood Glucose(mg/dl): 187 Weight(lbs): 240 Pulse(bpm): 94 Body Mass Index(BMI): 28.5 Blood Pressure(mmHg): 143/81 Temperature(F): 99.3 Respiratory Rate(breaths/min): 20 Wound Assessments Treatment Notes Electronic Signature(s) Signed: 06/22/2022  5:30:04 PM By: Deon Pilling RN, BSN Signed: 06/23/2022 8:48:16 AM By: Kalman Shan DO Entered By: Kalman Shan on 06/22/2022 15:27:07 -------------------------------------------------------------------------------- Multi-Disciplinary Care Plan Details Patient Name: Date of Service: [name], [name] 06/22/2022 3:00 PM Medical Record Number: [id number] Patient Account Number: [id number] Date of Birth/Sex: Treating RN: 12-04-1970 (51 y.o. [name] [name] Primary Care Hibba Schram: [name] Other Clinician: Referring Zilla Shartzer: Treating Sumeya Yontz/Extender: Delano Metz in Treatment: 0 Active Inactive Electronic Signature(s) Signed: 06/22/2022 5:30:04 PM By: Deon Pilling RN, BSN Entered By: Deon Pilling on 06/22/2022 17:08:06 -------------------------------------------------------------------------------- Pain Assessment Details Patient Name: Date of Service: [name], [name] 06/22/2022 3:00 PM Medical Record Number: [id number] Patient Account Number: [id number] Date of Birth/Sex: Treating RN: [birth year] (51 y.o. [name] [name] Primary Care Kinslee Dalpe: [name] Other Clinician: Referring Euleta Belson: Treating Trenna Kiely/Extender: Delano Metz in Treatment: 0 Active Problems Location of Pain Severity and Description of Pain Patient Has Paino Yes Site Locations Pain Location: Pain in Ulcers Rate the pain. Current Pain Level: 6 Pain Management and Medication Current Pain Management: Medication: No Cold Application: No Rest: No Massage: No Activity: No T.E.N.S.: No Heat Application: No Leg drop or elevation: No Is the Current Pain Management Adequate: Adequate How does your wound impact your activities of daily livingo Sleep: No Bathing: No Appetite: No Relationship With Others: No Bladder Continence: No Emotions: No Bowel Continence: No Work: No Toileting: No Drive: No Dressing: No Hobbies: No Engineer, maintenance) Signed: 06/22/2022 5:30:04 PM By: Deon Pilling RN, BSN Entered By: Deon Pilling on 06/22/2022 15:17:55 -------------------------------------------------------------------------------- Patient/Caregiver Education Details Patient Name: Date of Service: [name], Payton Doughty 7/31/2023andnbsp3:00 PM Medical Record Number: [id number] Patient Account Number: [id number] Date of Birth/Gender: Treating RN: Sep 02, 1971 (52 y.o. [name] [name] Primary Care Physician: [name] Other Clinician: Referring Physician: Treating Physician/Extender: Delano Metz in Treatment: 0 Education Assessment Education Provided To: Patient Education Topics Provided Welcome T [facility] [place]: o Handouts: Welcome T [facility] [place] o Methods: Explain/Verbal Responses: Reinforcements needed Electronic Signature(s) Signed: 06/22/2022 5:30:04 PM By: Deon Pilling RN, BSN Entered By: Deon Pilling on 06/22/2022 15:24:01 -------------------------------------------------------------------------------- Vitals Details Patient Name: Date of Service: STRICKLA  ND, [name] 06/22/2022 3:00 PM Medical Record Number: [id number] Patient Account Number: [id number] Date of Birth/Sex: Treating RN: [birth year] (51 y.o. [name] [name] Primary Care Larrie Lucia: [name] Other Clinician: Referring Jonalyn Sedlak: Treating Jillane Po/Extender: Delano Metz in Treatment: 0 Vital Signs Time Taken: 15:15 Temperature (F): 99.3 Height (in): 77 Pulse (bpm): 94 Source: Stated Respiratory Rate (breaths/min): 20 Weight (lbs): 240 Blood Pressure (mmHg): 143/81 Source: Stated Capillary Blood Glucose (mg/dl): 187 Body Mass Index (BMI): 28.5 Reference Range: 80 - 120 mg / dl Electronic Signature(s) Signed: 06/22/2022 5:30:04 PM By: Deon Pilling RN, BSN Entered By: Deon Pilling on 06/22/2022 15:16:05

## 2022-06-25 ENCOUNTER — Emergency Department (HOSPITAL_COMMUNITY): Payer: Medicare Other

## 2022-06-25 ENCOUNTER — Other Ambulatory Visit: Payer: Self-pay

## 2022-06-25 ENCOUNTER — Encounter (HOSPITAL_COMMUNITY)
Admission: RE | Admit: 2022-06-25 | Discharge: 2022-06-25 | Disposition: A | Discharge status: Home / Self Care | Payer: Medicare Other | Source: Ambulatory Visit | Attending: Nephrology | Admitting: Nephrology

## 2022-06-25 ENCOUNTER — Encounter (HOSPITAL_COMMUNITY): Payer: Self-pay | Admitting: Emergency Medicine

## 2022-06-25 ENCOUNTER — Emergency Department (HOSPITAL_COMMUNITY)
Admission: EM | Admit: 2022-06-25 | Discharge: 2022-06-25 | Disposition: A | Discharge status: Home / Self Care | Payer: Medicare Other | Attending: Emergency Medicine | Admitting: Emergency Medicine

## 2022-06-25 VITALS — BP 136/81 | HR 93 | Temp 97.4°F | Resp 19

## 2022-06-25 DIAGNOSIS — N189 Chronic kidney disease, unspecified: Secondary | ICD-10-CM | POA: Diagnosis not present

## 2022-06-25 DIAGNOSIS — D62 Acute posthemorrhagic anemia: Secondary | ICD-10-CM

## 2022-06-25 DIAGNOSIS — D649 Anemia, unspecified: Secondary | ICD-10-CM

## 2022-06-25 DIAGNOSIS — N179 Acute kidney failure, unspecified: Secondary | ICD-10-CM

## 2022-06-25 DIAGNOSIS — R222 Localized swelling, mass and lump, trunk: Secondary | ICD-10-CM | POA: Diagnosis not present

## 2022-06-25 DIAGNOSIS — M7989 Other specified soft tissue disorders: Secondary | ICD-10-CM

## 2022-06-25 DIAGNOSIS — R0789 Other chest pain: Secondary | ICD-10-CM | POA: Diagnosis present

## 2022-06-25 LAB — BASIC METABOLIC PANEL
Anion gap: 12 (ref 5–15)
BUN: 60 mg/dL — ABNORMAL HIGH (ref 6–20)
CO2: 24 mmol/L (ref 22–32)
Calcium: 7.7 mg/dL — ABNORMAL LOW (ref 8.9–10.3)
Chloride: 104 mmol/L (ref 98–111)
Creatinine, Ser: 5.33 mg/dL — ABNORMAL HIGH (ref 0.61–1.24)
GFR, Estimated: 12 mL/min — ABNORMAL LOW (ref >60)
Glucose, Bld: 259 mg/dL — ABNORMAL HIGH (ref 70–99)
Potassium: 4.6 mmol/L (ref 3.5–5.1)
Sodium: 140 mmol/L (ref 135–145)

## 2022-06-25 LAB — IRON AND TIBC
Iron: 22 ug/dL — ABNORMAL LOW (ref 45–182)
Saturation Ratios: 8 % — ABNORMAL LOW (ref 17.9–39.5)
TIBC: 270 ug/dL (ref 250–450)
UIBC: 248 ug/dL

## 2022-06-25 LAB — POCT HEMOGLOBIN-HEMACUE: Hemoglobin: 10.5 g/dL — ABNORMAL LOW (ref 13.0–17.0)

## 2022-06-25 LAB — FERRITIN: Ferritin: 43 ng/mL (ref 24–336)

## 2022-06-25 LAB — CBC WITH DIFFERENTIAL/PLATELET
Abs Immature Granulocytes: 0.02 10*3/uL (ref 0.00–0.07)
Basophils Absolute: 0 10*3/uL (ref 0.0–0.1)
Basophils Relative: 1 %
Eosinophils Absolute: 0.3 10*3/uL (ref 0.0–0.5)
Eosinophils Relative: 4 %
HCT: 29 % — ABNORMAL LOW (ref 39.0–52.0)
Hemoglobin: 9.1 g/dL — ABNORMAL LOW (ref 13.0–17.0)
Immature Granulocytes: 0 %
Lymphocytes Relative: 24 %
Lymphs Abs: 1.8 10*3/uL (ref 0.7–4.0)
MCH: 28.1 pg (ref 26.0–34.0)
MCHC: 31.4 g/dL (ref 30.0–36.0)
MCV: 89.5 fL (ref 80.0–100.0)
Monocytes Absolute: 0.5 10*3/uL (ref 0.1–1.0)
Monocytes Relative: 7 %
Neutro Abs: 4.7 10*3/uL (ref 1.7–7.7)
Neutrophils Relative %: 64 %
Platelets: 176 10*3/uL (ref 150–400)
RBC: 3.24 MIL/uL — ABNORMAL LOW (ref 4.22–5.81)
RDW: 14.3 % (ref 11.5–15.5)
WBC: 7.4 10*3/uL (ref 4.0–10.5)
nRBC: 0 % (ref 0.0–0.2)

## 2022-06-25 MED ORDER — EPOETIN ALFA-EPBX 10000 UNIT/ML IJ SOLN: 10000.0000 [IU] | INTRAMUSCULAR | Status: DC

## 2022-06-25 MED ORDER — CEPHALEXIN 500 MG PO CAPS: 500.0000 mg | ORAL_CAPSULE | Freq: Every day | ORAL | 0 refills | Status: AC

## 2022-06-25 MED ORDER — EPOETIN ALFA 10000 UNIT/ML IJ SOLN
INTRAMUSCULAR | Status: AC
  Administered 2022-06-25: 10000 [IU] via SUBCUTANEOUS
  Filled 2022-06-25: qty 1

## 2022-06-25 NOTE — ED Provider Notes (Signed)
[facility] EMERGENCY DEPARTMENT Provider Note   CSN: [id number] Arrival date & time: 06/25/22  1354     History  Chief Complaint  Patient presents with   Cyst    [name] [name] is a 51 y.o. male.  Presented to ER due to concern for cyst/swelling and pain on his left chest wall.  Patient reports that he has had a cyst/swelling for many years.  Many years ago he had a drainage performed at this area but it reaccumulated.  He has not had it formally evaluated recently.  Over the past few weeks he has noted some redness and increasing pain, seems to be more sensitive to touch.  No chills or fevers.  HPI     Home Medications Prior to Admission medications   Medication Sig Start Date End Date Taking? Authorizing Provider  cephALEXin (KEFLEX) 500 MG capsule Take 1 capsule (500 mg total) by mouth daily for 7 days. 06/25/22 07/02/22 Yes Floreen Teegarden, Ellwood Dense, MD  AGAMATRIX ULTRA-THIN LANCETS MISC Check blood sugars before meals twice daily 11/19/17   Mack Hook, MD  amLODipine (NORVASC) 10 MG tablet Take 10 mg by mouth daily.    [provider]  ARIPiprazole (ABILIFY) 20 MG tablet Take 20 mg by mouth daily. Patient not taking: Reported on 05/19/2022 08/06/19   [provider]  atorvastatin (LIPITOR) 40 MG tablet Take 40 mg by mouth daily. Patient not taking: Reported on 05/19/2022 08/11/21   [provider]  baclofen (LIORESAL) 10 MG tablet 1/2 tab twice a day Patient taking differently: Take 20 mg by mouth 2 (two) times daily as needed for muscle spasms. 01/28/18   Mack Hook, MD  desvenlafaxine (PRISTIQ) 100 MG 24 hr tablet Take 100 mg by mouth daily. 02/20/20   [provider]  diltiazem (CARDIZEM CD) 240 MG 24 hr capsule Take 1 capsule (240 mg total) by mouth daily. 05/19/22   Camnitz, Will Hassell Done, MD  FARXIGA 10 MG TABS tablet Take 10 mg by mouth daily. 09/03/21   [provider]  FLUoxetine (PROZAC) 40 MG capsule  Take 40 mg by mouth daily. 03/26/21   [provider]  fluticasone (FLONASE) 50 MCG/ACT nasal spray Place 2 sprays into both nostrils daily as needed for allergies or rhinitis.  01/09/19   [provider]  furosemide (LASIX) 40 MG tablet Take 40 mg by mouth daily.    [provider]  gabapentin (NEURONTIN) 300 MG capsule 1 cap by mouth in morning and midday, 2 caps by mouth at bedtime Patient taking differently: Take 300-600 mg by mouth See admin instructions. Take one capsule (300 mg) by mouth in the morning and afternoon, take two capsules (600 mg) at bedtime 11/19/17   Mack Hook, MD  GI Cocktail (alum & mag hydroxide-simethicone/lidocaine)oral mixture Take 15 mLs by mouth 3 (three) times daily as needed. Patient not taking: Reported on 05/19/2022 09/11/21   Doran Stabler, MD  glipiZIDE (GLUCOTROL) 5 MG tablet 1/2 tab by mouth twice daily with meal Patient taking differently: Take 5 mg by mouth daily with supper. 11/19/17   Mack Hook, MD  glucose blood (AGAMATRIX PRESTO TEST) test strip Check blood sugars twice daily before meals 02/02/18   Mack Hook, MD  glucose blood test strip 1 each by Other route as needed for other. Use as instructed Patient not taking: Reported on 05/19/2022    [provider]  Insulin Disposable Pump (OMNIPOD DASH PODS, GEN 4,) MISC SMARTSIG:SUB-Q Every 3 Days  04/15/22   [provider]  insulin lispro (HUMALOG) 100 UNIT/ML injection Use as directed via insulin pump. MAX TDD 30 units 04/10/22   [provider]  metoCLOPramide (REGLAN) 10 MG tablet Take 1 tablet (10 mg total) by mouth 2 (two) times daily. 09/09/21   Doran Stabler, MD  metoprolol tartrate (LOPRESSOR) 25 MG tablet Take 50 mg by mouth 2 (two) times daily. 02/21/21   [provider]  ondansetron (ZOFRAN) 4 MG tablet Take 1 tablet (4 mg total) by mouth 3 (three) times daily as needed for nausea or vomiting. 09/05/21    Sharyn Creamer, MD  pantoprazole (PROTONIX) 40 MG tablet Take 1 tablet (40 mg total) by mouth 2 (two) times daily. 04/23/21   Daleen Bo, MD  sucralfate (CARAFATE) 1 GM/10ML suspension Take 10 mLs (1 g total) by mouth 4 (four) times daily -  with meals and at bedtime. 09/03/21   Richarda Osmond, MD  TRULICITY 1.5 KV/4.2VZ SOPN Inject 1.5 mg into the skin once a week. Friday's Patient not taking: Reported on 05/19/2022 07/30/21   [provider]      Allergies    Iron and Adhesive [tape]    Review of Systems   Review of Systems  Constitutional:  Negative for chills and fever.  HENT:  Negative for ear pain and sore throat.   Eyes:  Negative for pain and visual disturbance.  Respiratory:  Negative for cough and shortness of breath.   Cardiovascular:  Negative for chest pain and palpitations.  Gastrointestinal:  Negative for abdominal pain and vomiting.  Genitourinary:  Negative for dysuria and hematuria.  Musculoskeletal:  Negative for arthralgias and back pain.  Skin:  Positive for color change and wound. Negative for rash.  Neurological:  Negative for seizures and syncope.  All other systems reviewed and are negative.   Physical Exam Updated Vital Signs BP (!) 173/92 (BP Location: Right Arm)   Pulse 90   Temp 97.8 F (36.6 C) (Oral)   Resp 18   Ht '6\' 5"'$  (1.956 m)   Wt 108.9 kg   SpO2 95%   BMI 28.46 kg/m  Physical Exam Vitals and nursing note reviewed.  Constitutional:      General: He is not in acute distress.    Appearance: He is well-developed.  HENT:     Head: Normocephalic and atraumatic.  Eyes:     Conjunctiva/sclera: Conjunctivae normal.  Cardiovascular:     Rate and Rhythm: Normal rate and regular rhythm.     Heart sounds: No murmur heard. Pulmonary:     Effort: Pulmonary effort is normal. No respiratory distress.     Breath sounds: Normal breath sounds.  Chest:     Comments: Over his left lateral chest wall there is approximately 4 cm diameter  area of swelling, slight overlying erythema, some tenderness to palpation Abdominal:     Palpations: Abdomen is soft.     Tenderness: There is no abdominal tenderness.  Musculoskeletal:        General: No swelling.     Cervical back: Neck supple.  Skin:    General: Skin is warm and dry.     Capillary Refill: Capillary refill takes less than 2 seconds.  Neurological:     Mental Status: He is alert.  Psychiatric:        Mood and Affect: Mood normal.     ED Results / Procedures / Treatments   Labs (all labs ordered are listed, but only  abnormal results are displayed) Labs Reviewed  CBC WITH DIFFERENTIAL/PLATELET - Abnormal; Notable for the following components:      Result Value   RBC 3.24 (*)    Hemoglobin 9.1 (*)    HCT 29.0 (*)    All other components within normal limits  BASIC METABOLIC PANEL - Abnormal; Notable for the following components:   Glucose, Bld 259 (*)    BUN 60 (*)    Creatinine, Ser 5.33 (*)    Calcium 7.7 (*)    GFR, Estimated 12 (*)    All other components within normal limits    EKG None  Radiology CT Chest Wo Contrast  Result Date: 06/25/2022 CLINICAL DATA:  Superficial soft tissue mass EXAM: CT CHEST WITHOUT CONTRAST TECHNIQUE: Multidetector CT imaging of the chest was performed following the standard protocol without IV contrast. RADIATION DOSE REDUCTION: This exam was performed according to the departmental dose-optimization program which includes automated exposure control, adjustment of the mA and/or kV according to patient size and/or use of iterative reconstruction technique. COMPARISON:  Radiographs 12/12/2021; CT abdomen and pelvis 09/02/2021 FINDINGS: Cardiovascular: No significant vascular findings. Normal heart size. No pericardial effusion. Mediastinum/Nodes: Multiple enlarged mediastinal nodes. For example right paratracheal node measuring 1.2 cm. Additional subcentimeter right axillary lymph nodes measuring up to 8 mm. Lungs/Pleura: Lungs  are clear. No pleural effusion or pneumothorax. Upper Abdomen: Similar to slight increase in nodular thickening of the left adrenal gland. No acute upper abdominal abnormality. Scattered 2-3 mm pulmonary nodules. For example in the right upper lobe (series 4/image 77) right lower lobe (4/57 Musculoskeletal: Round intermediate density lesion measuring 5.3 x 4.6 cm in the subcutaneous fat of the left flank abutting the latissimus dorsi. The lesion demonstrates mild irregular wall thickening and adjacent fat stranding/edema. This has slowly increased in size since 2020. No suspicious osseous lesion. IMPRESSION: 1. Round 5.3 cm intermediate density lesion in the subcutaneous fat of the left back. This has slowly increased in size since 2020. Recommend further evaluation with MRI to exclude neoplasm. 2. Enlarged mediastinal lymph nodes may be reactive however are indeterminate. Recommend continued attention on follow-up. 3. Tiny pulmonary nodules measuring up to 3 mm. No routine follow-up imaging is recommended. However these guidelines do not apply to immunocompromised patients or patients with cancer. Reference: Radiology. 2017; 284(1):228-43. Electronically Signed   By: Placido Sou M.D.   On: 06/25/2022 23:01    Procedures Procedures    Medications Ordered in ED Medications - No data to display  ED Course/ Medical Decision Making/ A&P                           Medical Decision Making Amount and/or Complexity of Data Reviewed Labs: ordered. Radiology: ordered.  Risk Prescription drug management.   52 year old male presenting to ER due to concern for pain and redness and swelling on his left side.  Endorses longstanding cyst at this location.  It is quite large.  I reviewed CT scan from 2020 and 2022, there does appear to be soft tissue swelling in that location present at that time.  Will repeat CT today to further characterize and check basic labs.  CT of the chest was obtained I independently  interpreted results and agree with radiology report.  The radiologist said that the lesion was approximately 5.3 cm in the subcutaneous fat, concern for possible mass.  Recommending MRI to exclude neoplasm.  Also noted mediastinal lymphadenopathy.  I discussed these findings  with the patient and discussed that he needs an MRI to further characterize.  Reviewed lab work, no leukocytosis.  He does have some anemia but appears within range of prior checks in the outpatient setting.  His creatinine is worse than prior in our epic system, GFR down to 12 today.  I discussed with Dr. Royce Macadamia who is very familiar with patient, follows with patient closely, states that his renal function has significantly worsened over the last few months and she was working him up in the outpatient setting likely for dialysis.  She states that so long as his electrolytes are stable and patient is well-appearing without any evidence for fluid overload, he does not need to be admitted.  She states that his last GFR in the office was 14.  He has a follow-up appointment later this month in her clinic.  Discussed lab findings detail.  While I suspect most likely the swelling that patient was concerned about today is related to this chronic mass, there was some slight overlying erythema.  Out of an abundance of precaution, will cover with antibiotics in case of some mild cellulitis.  Discussed with Larey Days.D. regarding renal dosing, advises cephalexin 500 mg daily.  Advised patient, he must f/u with PCP to discuss CT findings and request MRI.   After the discussed management above, the patient was determined to be safe for discharge.  The patient was in agreement with this plan and all questions regarding their care were answered.  ED return precautions were discussed and the patient will return to the ED with any significant worsening of condition.         Final Clinical Impression(s) / ED Diagnoses Final diagnoses:  Chronic  kidney disease, unspecified CKD stage  Soft tissue mass    Rx / DC Orders ED Discharge Orders          Ordered    cephALEXin (KEFLEX) 500 MG capsule  Daily        06/25/22 2321              Lucrezia Starch, MD 06/26/22 1547

## 2022-06-25 NOTE — ED Notes (Signed)
Received verbal report from [place] at this time

## 2022-06-25 NOTE — ED Notes (Signed)
Ambulated to restroom without assistance 

## 2022-06-25 NOTE — ED Notes (Signed)
Pt c/o a cyst in his lt arm pit

## 2022-06-25 NOTE — Discharge Instructions (Addendum)
Please go to your appointment with nephrology next week.  Please follow-up with your primary care doctor as well to discuss this mass and to request an MRI to further evaluate.  Out of an abundance of precaution I have prescribed an antibiotic in case there is an overlying skin infection, please take this antibiotic as prescribed.  If you notice that there is a significant increase in the redness, swelling size, any fever or chills, please return immediately to ER for reassessment at that time.

## 2022-06-25 NOTE — ED Notes (Signed)
Pt transported to ct at this time 

## 2022-06-25 NOTE — ED Triage Notes (Signed)
Patient with cyst on L side in the armpit area. Patient has had it for year but is red and inflammed looking. Denies fevers chills.

## 2022-06-28 NOTE — Progress Notes (Signed)
Cardiology Office Note Date:  06/30/2022  Patient ID:  [name] [name] May 20, 1971, MRN [id number] PCP:  [name], NP  Cardiologist:  Dr. Angelena Form Electrophysiologist: Dr. Curt Bears    Chief Complaint:  6 weeks  History of Present Illness: [name] [name] is a 51 y.o. 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  He was referred to Dr. Angelena Form April 2023 for palpitations, planned for monitoring and an echo  Monitor noted an SVT and recommended increasing his metoprolol and EP referral for possible ablation Turns out his PMD had already increased his metoprolol and no further changes made.  Echo noted preserved LVEF no ,  significant   He saw Dr. Curt Bears 05/19/22, aware of on/off fast rates, otherwise no particular symptoms reported.  He wanted to try and avoid procedures, started on diltiazem, stopped his metoprolol.  ER visit 06/25/22 for a cyst/swelling L chest wall, (reportedly drained some years prior). Creat worse, discussed with his nepgrologist, Dr. Royce Macadamia who has been seeing him regularly and likely pending need for HD, though if lytes were ok, no need to admit. CT chest noted mass/lesion, (not new noted on prior CTs) and radiology recommended MRI to better characterize. With some erythema given a course of antibiotics and discharged with close f/u with renal especially and PMD to discuss f/u on mass/MRI recs.  TODAY His palpitations are less often/shorter but not gone. Mostly noted in the evenings in bed. They can last a couple minutes, he is aware of the fast rate but not particularly symptomatic otherwise. No CP No near syncope or syncope. No SOB  While here his Dexcom alerts to BS of 67 and declining but feeling OK. He was given a cola and graham crackers with an upwards trend after a couple minutes, still feeling well He follows closely with endo, has a insulin  pump, infrequently with low readings   Past Medical History:  Diagnosis Date   Allergy    seasonal and environmental   Anemia    ARF (acute renal failure) ([place]) 09/2017   Chronic constipation 05/13/2020   CKD (chronic kidney disease) stage 4, GFR 15-29 ml/min (HCC)    Depression    Diabetes mellitus without complication ([place]) 0867   GERD (gastroesophageal reflux disease)    Hyperlipidemia    Hypertension    Necrotizing fasciitis ([place]) 10/13/2017   Neuromuscular disorder ([place])    neuropathy   PAD (peripheral artery disease) ([place])    Secondary hyperparathyroidism ([place])    Wound dehiscence 11/24/2017    Past Surgical History:  Procedure Laterality Date   AMPUTATION Right 10/13/2017   Procedure: RIGHT BELOW KNEE AMPUTATION;  Surgeon: [name], MD;  Location: [place] [facility];  Service: Orthopedics;  Laterality: Right;   AMPUTATION Right 11/24/2017   Procedure: AMPUTATION BELOW KNEE REVISION;  Surgeon: [name], MD;  Location: [place];  Service: Orthopedics;  Laterality: Right;   BIOPSY  09/02/2021   Procedure: BIOPSY;  Surgeon: Sharyn Creamer, MD;  Location: [facility] ENDOSCOPY;  Service: Gastroenterology;;   COLONOSCOPY  05/11/2019   ESOPHAGOGASTRODUODENOSCOPY (EGD) WITH PROPOFOL N/A 09/02/2021   Procedure: ESOPHAGOGASTRODUODENOSCOPY (EGD) WITH PROPOFOL;  Surgeon: Sharyn Creamer, MD;  Location: [place];  Service: Gastroenterology;  Laterality: N/A;   NO PAST SURGERIES     POLYPECTOMY     WISDOM TOOTH EXTRACTION      Current Outpatient Medications  Medication Sig Dispense Refill   AGAMATRIX  ULTRA-THIN LANCETS MISC Check blood sugars before meals twice daily 100 each 11   ARIPiprazole (ABILIFY) 20 MG tablet Take 20 mg by mouth daily.     baclofen (LIORESAL) 10 MG tablet 1/2 tab twice a day (Patient taking differently: Take 20 mg by mouth 2 (two) times daily as needed for muscle spasms.) 30 each 6   cephALEXin (KEFLEX) 500 MG capsule Take 1 capsule (500 mg total) by mouth daily for  7 days. 7 capsule 0   FARXIGA 10 MG TABS tablet Take 10 mg by mouth daily.     furosemide (LASIX) 40 MG tablet Take 40 mg by mouth daily.     gabapentin (NEURONTIN) 300 MG capsule 1 cap by mouth in morning and midday, 2 caps by mouth at bedtime (Patient taking differently: Take 300-600 mg by mouth See admin instructions. Take one capsule (300 mg) by mouth in the morning and afternoon, take two capsules (600 mg) at bedtime) 120 capsule 11   glucose blood (AGAMATRIX PRESTO TEST) test strip Check blood sugars twice daily before meals 100 each 12   glucose blood test strip 1 each by Other route as needed for other. Use as instructed     Insulin Disposable Pump (OMNIPOD DASH PODS, GEN 4,) MISC SMARTSIG:SUB-Q Every 3 Days     insulin lispro (HUMALOG) 100 UNIT/ML injection Use as directed via insulin pump. MAX TDD 30 units     metoCLOPramide (REGLAN) 10 MG tablet Take 1 tablet (10 mg total) by mouth 2 (two) times daily. 60 tablet 1   ondansetron (ZOFRAN) 4 MG tablet Take 1 tablet (4 mg total) by mouth 3 (three) times daily as needed for nausea or vomiting. 30 tablet 0   pantoprazole (PROTONIX) 40 MG tablet Take 1 tablet (40 mg total) by mouth 2 (two) times daily. 60 tablet 2   sucralfate (CARAFATE) 1 GM/10ML suspension Take 10 mLs (1 g total) by mouth 4 (four) times daily -  with meals and at bedtime. 420 mL 0   diltiazem (CARDIZEM CD) 240 MG 24 hr capsule Take 1 capsule (240 mg total) by mouth at bedtime. 30 capsule 3   No current facility-administered medications for this visit.    Allergies:   Iron and Adhesive [tape]   Social History:  The patient  reports that he has been smoking cigarettes. He has a 17.50 pack-year smoking history. He has never used smokeless tobacco. He reports that he does not drink alcohol and does not use drugs.   Family History:  The patient's family history includes Colon polyps in his mother; Depression in his mother; Diabetes Mellitus II in his mother; Heart disease in  his father.  ROS:  Please see the history of present illness.    All other systems are reviewed and otherwise negative.   PHYSICAL EXAM:  VS:  BP 132/82   Pulse 87   Ht '6\' 5"'$  (1.956 m)   Wt 240 lb (108.9 kg)   BMI 28.46 kg/m  BMI: Body mass index is 28.46 kg/m. Well nourished, well developed, in no acute distress HEENT: normocephalic, atraumatic Neck: no JVD, carotid bruits or masses Cardiac:  RRR; no significant murmurs, no rubs, or gallops Lungs:  CTA b/l, no wheezing, rhonchi or rales Abd: soft, nontender MS: RLE prosthesis Ext: no edema LLE, chronic looking skin changes Skin: warm and dry, no rash Neuro:  No gross deficits appreciated Psych: euthymic mood, full affect    EKG:  not done today  May 2023: monitor Sinus  rhythm. Minimum heart rate of 59 bpm. Maximum heart rate of 171 bpm.  1 run of Ventricular Tachycardia occurred lasting 5 beats  47 Supraventricular Tachycardia  runs occurred, the longest lasting 2 mins 8 seconds. Supraventricular Tachycardia was detected within +/- 45 seconds of symptomatic patient event(s). Rare Premature atrial contractions (<1%) Rare premature ventricular contractions. (<1.0%, 1357)  03/31/2022: TTE 1. Left ventricular ejection fraction, by estimation, is 60 to 65%. The  left ventricle has normal function. The left ventricle has no regional  wall motion abnormalities. There is mild left ventricular hypertrophy.  Left ventricular diastolic parameters  were normal.   2. Right ventricular systolic function is normal. The right ventricular  size is normal. Tricuspid regurgitation signal is inadequate for assessing  PA pressure.   3. The mitral valve is normal in structure. No evidence of mitral valve  regurgitation. No evidence of mitral stenosis.   4. The aortic valve has an indeterminant number of cusps. Aortic valve  regurgitation is not visualized. No aortic stenosis is present.   5. The inferior vena cava is normal in size with  greater than 50%  respiratory variability, suggesting right atrial pressure of 3 mmHg.   Comparison(s): No prior Echocardiogram.    Recent Labs: 09/02/2021: ALT 9; Magnesium 1.7 06/25/2022: BUN 60; Creatinine, Ser 5.33; Hemoglobin 9.1; Platelets 176; Potassium 4.6; Sodium 140  No results found for requested labs within last 365 days.   Estimated Creatinine Clearance: 23 mL/min (A) (by C-G formula based on SCr of 5.33 mg/dL (H)).   Wt Readings from Last 3 Encounters:  06/30/22 240 lb (108.9 kg)  06/25/22 240 lb (108.9 kg)  05/19/22 243 lb 3.2 oz (110.3 kg)     Other studies reviewed: Additional studies/records reviewed today include: summarized above  ASSESSMENT AND PLAN:  SVT Improved, not resolved With mostly nighttime palpitations Advise tomorrow he move his diltiazem to the evenings.  If no improvement we can increase his dose   Disposition: F/u with [place] in 3 mo, sooner if needed.  He will call is if no improvement with changing the time of his diltiazem.  Current medicines are reviewed at length with the patient today.  The patient did not have any concerns regarding medicines.  Venetia Night, PA-C 06/30/2022 12:52 PM     [address] [place] [address] [phone] (office)  [phone] (fax)

## 2022-06-30 ENCOUNTER — Ambulatory Visit (INDEPENDENT_AMBULATORY_CARE_PROVIDER_SITE_OTHER): Payer: Medicare Other | Admitting: Physician Assistant

## 2022-06-30 ENCOUNTER — Encounter: Payer: Self-pay | Admitting: Physician Assistant

## 2022-06-30 VITALS — BP 132/82 | HR 87 | Ht 77.0 in | Wt 240.0 lb

## 2022-06-30 DIAGNOSIS — I471 Supraventricular tachycardia, unspecified: Secondary | ICD-10-CM

## 2022-06-30 MED ORDER — DILTIAZEM HCL ER COATED BEADS 240 MG PO CP24: 240.0000 mg | ORAL_CAPSULE | Freq: Every day | ORAL | 3 refills | Status: DC

## 2022-06-30 NOTE — Patient Instructions (Addendum)
Medication Instructions:   STARTING  TOMORROW EVENING TAKING DILTIAZEM 240 MG AT BEDTIME NOT IN THE MORNING   *If you need a refill on your cardiac medications before your next appointment, please call your pharmacy*   Lab Work:  NONE ORDERED  TODAY   If you have labs (blood work) drawn today and your tests are completely normal, you will receive your results only by: [place] (if you have MyChart) OR A paper copy in the mail If you have any lab test that is abnormal or we need to change your treatment, we will call you to review the results.   Testing/Procedures: NONE ORDERED  TODAY    Follow-Up: At [facility], you and your health needs are our priority.  As part of our continuing mission to provide you with exceptional heart care, we have created designated Provider Care Teams.  These Care Teams include your primary Cardiologist (physician) and Advanced Practice Providers (APPs -  Physician Assistants and Nurse Practitioners) who all work together to provide you with the care you need, when you need it.  We recommend signing up for the patient portal called "MyChart".  Sign up information is provided on this After Visit Summary.  MyChart is used to connect with patients for Virtual Visits (Telemedicine).  Patients are able to view lab/test results, encounter notes, upcoming appointments, etc.  Non-urgent messages can be sent to your provider as well.   To learn more about what you can do with MyChart, go to [email or web address].    Your next appointment:   3 month(s)  The format for your next appointment:   In Person  Provider:   You may see Dr Curt Bears or one of the following Advanced Practice Providers on your designated Care Team:   Tommye Standard, [place]   Important Information About Sugar

## 2022-07-01 ENCOUNTER — Ambulatory Visit (INDEPENDENT_AMBULATORY_CARE_PROVIDER_SITE_OTHER): Payer: Medicare Other | Admitting: Podiatry

## 2022-07-01 DIAGNOSIS — E0843 Diabetes mellitus due to underlying condition with diabetic autonomic (poly)neuropathy: Secondary | ICD-10-CM

## 2022-07-01 DIAGNOSIS — B351 Tinea unguium: Secondary | ICD-10-CM | POA: Diagnosis not present

## 2022-07-01 DIAGNOSIS — L989 Disorder of the skin and subcutaneous tissue, unspecified: Secondary | ICD-10-CM

## 2022-07-01 DIAGNOSIS — M79675 Pain in left toe(s): Secondary | ICD-10-CM

## 2022-07-01 NOTE — Progress Notes (Signed)
   Chief Complaint  Patient presents with   wound care    Patient states that he was referred to our office wound care states that there is nothing else they can do.he keeps getting callous that split open.    SUBJECTIVE Patient with a history of diabetes mellitus, last hemoglobin A1c 04/10/2022 7.7, presents to office today complaining of symptomatic calluses to the plantar aspect of the left foot.  Patient also has elongated, thickened nails that cause pain while ambulating in shoes.  Patient is unable to trim their own nails.   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  Patient is here for further evaluation and treatment.   Past Medical History:  Diagnosis Date   Allergy    seasonal and environmental   Anemia    ARF (acute renal failure) ([place]) 09/2017   Chronic constipation 05/13/2020   CKD (chronic kidney disease) stage 4, GFR 15-29 ml/min (HCC)    Depression    Diabetes mellitus without complication ([place]) 5974   GERD (gastroesophageal reflux disease)    Hyperlipidemia    Hypertension    Necrotizing fasciitis ([place]) 10/13/2017   Neuromuscular disorder (HCC)    neuropathy   PAD (peripheral artery disease) ([place])    Secondary hyperparathyroidism ([place])    Wound dehiscence 11/24/2017    OBJECTIVE General Patient is awake, alert, and oriented x 3 and in no acute distress. Derm xerosis of the skin noted left lower extremity. Nails are tender, long, thickened and dystrophic with subungual debris, consistent with onychomycosis, 1-5 bilateral. No signs of infection noted.  Symptomatic calluses noted to the plantar aspect of the first MTP and the plantar aspect of the fifth metatarsal tubercle left foot Vasc  DP and PT pedal pulses palpable left. Temperature gradient within normal limits.  Neuro Epicritic and protective threshold sensation diminished left.  Musculoskeletal Exam past surgical history BKA  RLE  ASSESSMENT 1. Diabetes Mellitus w/ peripheral neuropathy 2.  Pain due to onychomycosis of toenails bilateral  PLAN OF CARE 1. Patient evaluated today. 2. Instructed to maintain good pedal hygiene and foot care. Stressed importance of controlling blood sugar.  3. Mechanical debridement of nails 1-5 left performed using a nail nipper. Filed with dremel without incident.  4.  Excisional debridement of the preulcerative callus tissue to the plantar aspect of the left foot was performed using a 312 scalpel without incident or bleeding.   5.  Unfortunately we do not have staffing for new scheduling of diabetic shoes with custom molded insoles.  The patient does need this however.  We will see if we can place him on a wait list for diabetic shoe fitting.   6.  Return to clinic in 3 mos. for routine footcare    Edrick Kins, DPM [facility]  Dr. Edrick Kins, DPM    [address], [place] [address]                Office [phone]  Fax [phone]

## 2022-07-03 ENCOUNTER — Other Ambulatory Visit: Payer: Self-pay

## 2022-07-03 ENCOUNTER — Emergency Department (HOSPITAL_COMMUNITY): Payer: Medicare Other

## 2022-07-03 ENCOUNTER — Emergency Department (HOSPITAL_COMMUNITY)
Admission: EM | Admit: 2022-07-03 | Discharge: 2022-07-03 | Disposition: A | Discharge status: Home / Self Care | Payer: Medicare Other | Attending: Emergency Medicine | Admitting: Emergency Medicine

## 2022-07-03 ENCOUNTER — Encounter (HOSPITAL_COMMUNITY): Payer: Self-pay | Admitting: Emergency Medicine

## 2022-07-03 DIAGNOSIS — L089 Local infection of the skin and subcutaneous tissue, unspecified: Secondary | ICD-10-CM

## 2022-07-03 DIAGNOSIS — E1122 Type 2 diabetes mellitus with diabetic chronic kidney disease: Secondary | ICD-10-CM | POA: Diagnosis not present

## 2022-07-03 DIAGNOSIS — L723 Sebaceous cyst: Secondary | ICD-10-CM | POA: Insufficient documentation

## 2022-07-03 DIAGNOSIS — Z794 Long term (current) use of insulin: Secondary | ICD-10-CM | POA: Insufficient documentation

## 2022-07-03 DIAGNOSIS — L0889 Other specified local infections of the skin and subcutaneous tissue: Secondary | ICD-10-CM | POA: Insufficient documentation

## 2022-07-03 DIAGNOSIS — N189 Chronic kidney disease, unspecified: Secondary | ICD-10-CM | POA: Insufficient documentation

## 2022-07-03 LAB — CBC WITH DIFFERENTIAL/PLATELET
Abs Immature Granulocytes: 0.05 10*3/uL (ref 0.00–0.07)
Basophils Absolute: 0 10*3/uL (ref 0.0–0.1)
Basophils Relative: 1 %
Eosinophils Absolute: 0.3 10*3/uL (ref 0.0–0.5)
Eosinophils Relative: 3 %
HCT: 29.3 % — ABNORMAL LOW (ref 39.0–52.0)
Hemoglobin: 9.1 g/dL — ABNORMAL LOW (ref 13.0–17.0)
Immature Granulocytes: 1 %
Lymphocytes Relative: 18 %
Lymphs Abs: 1.6 10*3/uL (ref 0.7–4.0)
MCH: 28.1 pg (ref 26.0–34.0)
MCHC: 31.1 g/dL (ref 30.0–36.0)
MCV: 90.4 fL (ref 80.0–100.0)
Monocytes Absolute: 0.5 10*3/uL (ref 0.1–1.0)
Monocytes Relative: 6 %
Neutro Abs: 6.1 10*3/uL (ref 1.7–7.7)
Neutrophils Relative %: 71 %
Platelets: 243 10*3/uL (ref 150–400)
RBC: 3.24 MIL/uL — ABNORMAL LOW (ref 4.22–5.81)
RDW: 14.4 % (ref 11.5–15.5)
WBC: 8.6 10*3/uL (ref 4.0–10.5)
nRBC: 0 % (ref 0.0–0.2)

## 2022-07-03 LAB — BASIC METABOLIC PANEL
Anion gap: 8 (ref 5–15)
BUN: 61 mg/dL — ABNORMAL HIGH (ref 6–20)
CO2: 22 mmol/L (ref 22–32)
Calcium: 8 mg/dL — ABNORMAL LOW (ref 8.9–10.3)
Chloride: 109 mmol/L (ref 98–111)
Creatinine, Ser: 4.59 mg/dL — ABNORMAL HIGH (ref 0.61–1.24)
GFR, Estimated: 15 mL/min — ABNORMAL LOW (ref >60)
Glucose, Bld: 235 mg/dL — ABNORMAL HIGH (ref 70–99)
Potassium: 4.4 mmol/L (ref 3.5–5.1)
Sodium: 139 mmol/L (ref 135–145)

## 2022-07-03 MED ORDER — DOXYCYCLINE HYCLATE 100 MG PO TABS
100.0000 mg | ORAL_TABLET | Freq: Once | ORAL | Status: AC
  Administered 2022-07-03: 100 mg via ORAL
  Filled 2022-07-03: qty 1

## 2022-07-03 MED ORDER — GADOBUTROL 1 MMOL/ML IV SOLN
10.0000 mL | Freq: Once | INTRAVENOUS | Status: AC | PRN
  Administered 2022-07-03: 10 mL via INTRAVENOUS

## 2022-07-03 MED ORDER — OXYCODONE-ACETAMINOPHEN 5-325 MG PO TABS: 1.0000 | ORAL_TABLET | Freq: Four times a day (QID) | ORAL | 0 refills | Status: DC | PRN

## 2022-07-03 MED ORDER — LORAZEPAM 1 MG PO TABS
1.0000 mg | ORAL_TABLET | Freq: Once | ORAL | Status: AC | PRN
  Administered 2022-07-03: 1 mg via ORAL
  Filled 2022-07-03: qty 1

## 2022-07-03 MED ORDER — DOXYCYCLINE HYCLATE 100 MG PO CAPS: 100.0000 mg | ORAL_CAPSULE | Freq: Two times a day (BID) | ORAL | 0 refills | Status: DC

## 2022-07-03 MED ORDER — LIDOCAINE-EPINEPHRINE (PF) 2 %-1:200000 IJ SOLN
20.0000 mL | Freq: Once | INTRAMUSCULAR | Status: AC
  Administered 2022-07-03: 20 mL
  Filled 2022-07-03: qty 20

## 2022-07-03 NOTE — ED Provider Notes (Signed)
[facility] EMERGENCY DEPARTMENT Provider Note   CSN: [id number] Arrival date & time: 07/03/22  0417     History  Chief Complaint  Patient presents with   Infected Cyst    [name] [name] is a 51 y.o. male.  HPI     51 year old male comes in with chief complaint of infected cyst.  Patient has history of diabetes, chronic kidney disease, PAD and previous history of necrotizing fasciitis.  He states that he has has a cyst over the left flank region years.  Over the last few days discussed going.  He was seen in the ER last week.  MRI was recommended with follow-up with PCP and referral to general surgery if needed.  He has not followed up with his PCP.  Patient states that over the past few days, lesion has become more red and there is a drainage now.  Drainage is bloody and purulent.  Review of system is negative for any nausea, vomiting, fevers, chills.  Home Medications Prior to Admission medications   Medication Sig Start Date End Date Taking? Authorizing Provider  doxycycline (VIBRAMYCIN) 100 MG capsule Take 1 capsule (100 mg total) by mouth 2 (two) times daily. 07/03/22  Yes Varney Biles, MD  Truddie Crumble ULTRA-THIN LANCETS MISC Check blood sugars before meals twice daily 11/19/17   Mack Hook, MD  ARIPiprazole (ABILIFY) 20 MG tablet Take 20 mg by mouth daily. 08/06/19   [provider]  baclofen (LIORESAL) 10 MG tablet 1/2 tab twice a day Patient taking differently: Take 20 mg by mouth 2 (two) times daily as needed for muscle spasms. 01/28/18   Mack Hook, MD  diltiazem (CARDIZEM CD) 240 MG 24 hr capsule Take 1 capsule (240 mg total) by mouth at bedtime. 06/30/22   Baldwin Jamaica, PA-C  FARXIGA 10 MG TABS tablet Take 10 mg by mouth daily. 09/03/21   [provider]  furosemide (LASIX) 40 MG tablet Take 40 mg by mouth daily.    [provider]  gabapentin (NEURONTIN) 300 MG capsule 1 cap by mouth in morning and  midday, 2 caps by mouth at bedtime Patient taking differently: Take 300-600 mg by mouth See admin instructions. Take one capsule (300 mg) by mouth in the morning and afternoon, take two capsules (600 mg) at bedtime 11/19/17   Mack Hook, MD  glucose blood (AGAMATRIX PRESTO TEST) test strip Check blood sugars twice daily before meals 02/02/18   Mack Hook, MD  glucose blood test strip 1 each by Other route as needed for other. Use as instructed    [provider]  Insulin Disposable Pump (OMNIPOD DASH PODS, GEN 4,) MISC SMARTSIG:SUB-Q Every 3 Days 04/15/22   [provider]  insulin lispro (HUMALOG) 100 UNIT/ML injection Use as directed via insulin pump. MAX TDD 30 units 04/10/22   [provider]  metoCLOPramide (REGLAN) 10 MG tablet Take 1 tablet (10 mg total) by mouth 2 (two) times daily. 09/09/21   Doran Stabler, MD  ondansetron (ZOFRAN) 4 MG tablet Take 1 tablet (4 mg total) by mouth 3 (three) times daily as needed for nausea or vomiting. 09/05/21   Sharyn Creamer, MD  pantoprazole (PROTONIX) 40 MG tablet Take 1 tablet (40 mg total) by mouth 2 (two) times daily. 04/23/21   Daleen Bo, MD  sucralfate (CARAFATE) 1 GM/10ML suspension Take 10 mLs (1 g total) by mouth 4 (four) times daily -  with meals and at bedtime. 09/03/21   Ouida Sills,  Chelsey L, MD      Allergies    Iron and Adhesive [tape]    Review of Systems   Review of Systems  All other systems reviewed and are negative.   Physical Exam Updated Vital Signs BP (!) 186/95   Pulse 80   Temp 97.8 F (36.6 C)   Resp 20   SpO2 96%  Physical Exam Vitals and nursing note reviewed.  Constitutional:      Appearance: He is well-developed.  HENT:     Head: Atraumatic.  Cardiovascular:     Rate and Rhythm: Normal rate.  Pulmonary:     Effort: Pulmonary effort is normal.  Musculoskeletal:     Cervical back: Neck supple.     Comments: In the left flank region, patient has large, soft,  fixed mass that measures at least 7 or 8 cm.  There is erythema near the top.  Positive tenderness with palpation.  No drainage.  Skin:    General: Skin is warm.  Neurological:     Mental Status: He is alert and oriented to person, place, and time.      ED Results / Procedures / Treatments   Labs (all labs ordered are listed, but only abnormal results are displayed) Labs Reviewed  CBC WITH DIFFERENTIAL/PLATELET - Abnormal; Notable for the following components:      Result Value   RBC 3.24 (*)    Hemoglobin 9.1 (*)    HCT 29.3 (*)    All other components within normal limits  BASIC METABOLIC PANEL - Abnormal; Notable for the following components:   Glucose, Bld 235 (*)    BUN 61 (*)    Creatinine, Ser 4.59 (*)    Calcium 8.0 (*)    GFR, Estimated 15 (*)    All other components within normal limits    EKG None  Radiology MR CHEST W WO CONTRAST  Result Date: 07/03/2022 CLINICAL DATA:  Soft tissue mass, chest, superficial msk protocol, flank subcutaneous mass EXAM: MR CHEST WITH AND WITHOUT CONTRAST TECHNIQUE: Multiplanar, multisequence MR imaging of the chest was performed before and after the administration of intravenous contrast. CONTRAST:  28m GADAVIST GADOBUTROL 1 MMOL/ML IV SOLN COMPARISON:  Chest CT 06/25/2022, CT 09/02/2021, CT 08/18/2019 FINDINGS: Bones/Joint/Cartilage: There is no significant marrow signal alteration. The cortex is intact. Muscles and Tendons: Reactive streaky edema within the left latissimus dorsi muscle adjacent to a rim enhancing collection/mass discussed below. Soft tissue: Along the left lower posterolateral chest wall, there is a subcutaneous collection with thick peripheral enhancement and an internal septation along the lateral margin measuring 5.7 x 5.3 x 4.8 cm (series 13 image 80, series 3, image 31). Restricted diffusion along the non-artifact affected portions on DWI. There is abutment of the latissimus dorsi with associated streaky edema signal  and a thin tail of enhancement along the superficial muscle margin (series 13, images 76 and 78) which is likely reactive. There is a cystic appearing lesion in this location on prior CT abdomen pelvis is dating back to September 2020, and this has progressively enlarged. Miscellaneous: Unchanged nodular left adrenal thickening, as seen on multiple prior exams, likely adrenal hyperplasia or multiple adenomas. There is signal dropout on chemical shift imaging. IMPRESSION: Rim enhancing collection measuring 5.7 x 5.3 x 4.8 cm along the left lower posterolateral chest wall most consistent with an abscess. This has slowly enlarged over 3 years. Recommend sending a fluid sample for both cytology and culture. 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 Electronically Signed   By: Maurine Simmering M.D.   On: 07/03/2022 12:07    Procedures .Marland KitchenIncision and Drainage  Date/Time: 07/03/2022 3:17 PM  Performed by: Levie Heritage, MD Authorized by: Varney Biles, MD   Consent:    Consent obtained:  Verbal   Consent given by:  Patient   Risks, benefits, and alternatives were discussed: yes     Risks discussed:  Incomplete drainage, pain, bleeding and damage to other organs   Alternatives discussed:  No treatment Universal protocol:    Procedure explained and questions answered to patient or proxy's satisfaction: yes     Patient identity confirmed:  Verbally with patient Location:    Type:  Abscess   Size:  8 cm   Location:  Trunk   Trunk location:  Chest Pre-procedure details:    Skin preparation:  Chlorhexidine with alcohol and chlorhexidine Sedation:    Sedation type:  None Anesthesia:    Anesthesia method:  Local infiltration   Local anesthetic:  Lidocaine 2% WITH epi Procedure type:    Complexity:  Complex Procedure details:    Ultrasound guidance: no     Needle aspiration: no     Incision types:  Single straight    Incision depth:  Subcutaneous   Wound management:  Probed and deloculated, irrigated with saline, extensive cleaning and debrided   Drainage:  Bloody and purulent   Drainage amount:  Copious   Wound treatment:  Drain placed   Packing materials:  1/4 in iodoform gauze Post-procedure details:    Procedure completion:  Tolerated well, no immediate complications     Medications Ordered in ED Medications  lidocaine-EPINEPHrine (XYLOCAINE W/EPI) 2 %-1:200000 (PF) injection 20 mL (has no administration in time range)  doxycycline (VIBRA-TABS) tablet 100 mg (has no administration in time range)  LORazepam (ATIVAN) tablet 1 mg (1 mg Oral Given 07/03/22 1007)  gadobutrol (GADAVIST) 1 MMOL/ML injection 10 mL (10 mLs Intravenous Contrast Given 07/03/22 1137)    ED Course/ Medical Decision Making/ A&P                           Medical Decision Making Amount and/or Complexity of Data Reviewed Radiology: ordered.  Risk Prescription drug management.  This patient presents to the ED with chief complaint(s) of back pain with pertinent past medical history of diabetes, PAD, necrotizing infection and CKD, and previous I&D at the same site which further complicates the presenting complaint. The complaint involves an extensive differential diagnosis and also carries with it a high risk of complications and morbidity.    Patient resting states he is back to her CT chest was ordered.  The mass noted to be complex, outpatient MRI was recommended.  Patient develops with PCP or had MRI completed.  The differential diagnosis includes : Infected cyst, abscess, fat necrosis, malignancy  The initial plan is to discuss the case with general surgery on the next best step (MRI versus inpatient consult).    Additional history obtained: Records reviewed Care Everywhere/External Records and previous CT scan from 8[facility]3 and also CT scan from outside hospital from [facility] labs interpretation:  The following  labs were independently interpreted: CBC and metabolic profile are normal  Independent visualization of imaging: - I independently visualized the following imaging with scope of interpretation limited to determining acute life threatening conditions related to emergency care: MRI of the chest, which revealed complex  appearing fluid.  Radiology confirms that patient has an abscess.   Consultation: - Consulted or discussed management/test interpretation w/ external professional: Spoke with general surgery at patient's arrival.  They do think patient will likely need MRI and tissue biopsy before resection is considered. We will get MRI now.  Discussed case with interventional radiology.  They recommended outpatient route for biopsy if needed, especially as the ordering doctor will have to follow-up on the pathology results.   After the MRI, case discussed with general surgery.  They recommended be complete incision and drainage in the emergency room.  They will follow-up with the patient in their clinic.  They want patient to get doxycycline at the time of discharge   Final Clinical Impression(s) / ED Diagnoses Final diagnoses:  Infected sebaceous cyst of skin    Rx / DC Orders ED Discharge Orders          Ordered    doxycycline (VIBRAMYCIN) 100 MG capsule  2 times daily        07/03/22 1514              Varney Biles, MD 07/03/22 1518

## 2022-07-03 NOTE — ED Notes (Signed)
Patient taken to MRI ativan administered

## 2022-07-03 NOTE — ED Notes (Signed)
Pt back from MRI and hooked back up to monitor

## 2022-07-03 NOTE — Discharge Instructions (Signed)
You are seen in the ER for swelling to your back.  You have a large abscess, that was drained in the emergency room.  As discussed, you likely have a cyst that will need to be removed.  Please call general surgery as requested for a follow-up appointment.  Take the antibiotics as prescribed.

## 2022-07-03 NOTE — ED Provider Triage Note (Signed)
Emergency Medicine Provider Triage Evaluation Note  [name] [name] , a 51 y.o. male  was evaluated in triage.  Pt complains of worsening cyst to his left side which has been present for about a year now.  Was seen in the ED for same in 08/03, placed on antibiotics, has been taking these with progressive worsening and increased drainage.  Denies fever, chills, nausea, or vomiting..  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  Other:     Medical Decision Making  Medically screening exam initiated at 4:55 AM.  Appropriate orders placed.  [name] was informed that the remainder of the evaluation will be completed by another provider, this initial triage assessment does not replace that evaluation, and the importance of remaining in the ED until their evaluation is complete.  [place].    Amaryllis Dyke, [place] 07/03/22 [phone]

## 2022-07-03 NOTE — ED Triage Notes (Addendum)
Patient reports worsening infected cyst at left back onset last week with swelling and purulent drainage , denies fever or chills .

## 2022-07-03 NOTE — ED Notes (Signed)
Patient verbalizes understanding of discharge instructions. Opportunity for questioning and answers were provided. Armband removed by staff, pt discharged from ED. Pt taken to ED entrance via wheel chair.  

## 2022-07-06 ENCOUNTER — Other Ambulatory Visit: Payer: Medicare Other

## 2022-07-09 ENCOUNTER — Encounter (HOSPITAL_COMMUNITY)
Admission: RE | Admit: 2022-07-09 | Discharge: 2022-07-09 | Disposition: A | Discharge status: Home / Self Care | Payer: Medicare Other | Source: Ambulatory Visit | Attending: Nephrology | Admitting: Nephrology

## 2022-07-09 VITALS — BP 131/71 | HR 96 | Temp 97.2°F | Resp 20

## 2022-07-09 DIAGNOSIS — D62 Acute posthemorrhagic anemia: Secondary | ICD-10-CM

## 2022-07-09 DIAGNOSIS — N179 Acute kidney failure, unspecified: Secondary | ICD-10-CM

## 2022-07-09 DIAGNOSIS — D649 Anemia, unspecified: Secondary | ICD-10-CM | POA: Insufficient documentation

## 2022-07-09 LAB — POCT HEMOGLOBIN-HEMACUE: Hemoglobin: 10.8 g/dL — ABNORMAL LOW (ref 13.0–17.0)

## 2022-07-09 MED ORDER — EPOETIN ALFA 10000 UNIT/ML IJ SOLN
INTRAMUSCULAR | Status: AC
  Filled 2022-07-09: qty 1

## 2022-07-09 MED ORDER — EPOETIN ALFA-EPBX 10000 UNIT/ML IJ SOLN: 10000.0000 [IU] | INTRAMUSCULAR | Status: DC

## 2022-07-09 MED ORDER — EPOETIN ALFA 10000 UNIT/ML IJ SOLN
10000.0000 [IU] | INTRAMUSCULAR | Status: DC
  Administered 2022-07-09: 10000 [IU] via SUBCUTANEOUS

## 2022-07-14 ENCOUNTER — Telehealth: Payer: Self-pay | Admitting: Cardiovascular Disease

## 2022-07-14 NOTE — Telephone Encounter (Signed)
Left message for patient to call back.  Per note from office visit with Renee:  ASSESSMENT AND PLAN:   SVT Improved, not resolved With mostly nighttime palpitations Advise tomorrow he move his diltiazem to the evenings.  If no improvement we can increase his dose   Will send to her for further advisement.

## 2022-07-14 NOTE — Telephone Encounter (Signed)
Pt c/o medication issue:  1. Name of Medication:   diltiazem (CARDIZEM CD) 240 MG 24 hr capsule  2. How are you currently taking this medication (dosage and times per day)?   As prescribed  3. Are you having a reaction (difficulty breathing--STAT)?   No  4. What is your medication issue?   Patient called stating that switching the medication to taking it at night did not help the racing heart.  Patient would like to know what are the next steps.

## 2022-07-20 ENCOUNTER — Encounter: Payer: Self-pay | Admitting: Cardiovascular Disease

## 2022-07-20 ENCOUNTER — Other Ambulatory Visit: Payer: Self-pay | Admitting: *Deleted

## 2022-07-20 MED ORDER — DILTIAZEM HCL ER COATED BEADS 300 MG PO CP24
300.0000 mg | ORAL_CAPSULE | Freq: Every day | ORAL | 2 refills | Status: DC
Start: 2022-07-20 — End: 2022-10-21

## 2022-07-20 NOTE — Telephone Encounter (Signed)
Spoke with patient to update about symptoms and still having nighttime palpitations. Patient was agreeable to [facility] recommendations increase Diltiazem 300 mg and take at bed time and giving [place] an update  3-7 days and if not working will see about a sooner office visit and procedure options. Rx sent in to pharmacy with 30 days one refill.

## 2022-07-20 NOTE — Telephone Encounter (Signed)
° °  Pt is calling back to f/u °

## 2022-07-23 ENCOUNTER — Ambulatory Visit (HOSPITAL_COMMUNITY)
Admission: RE | Admit: 2022-07-23 | Discharge: 2022-07-23 | Disposition: A | Discharge status: Home / Self Care | Payer: Medicare Other | Source: Ambulatory Visit | Attending: Nephrology | Admitting: Nephrology

## 2022-07-23 VITALS — BP 138/81 | HR 86 | Temp 97.8°F | Resp 18

## 2022-07-23 DIAGNOSIS — N179 Acute kidney failure, unspecified: Secondary | ICD-10-CM | POA: Diagnosis present

## 2022-07-23 DIAGNOSIS — D649 Anemia, unspecified: Secondary | ICD-10-CM | POA: Insufficient documentation

## 2022-07-23 DIAGNOSIS — D62 Acute posthemorrhagic anemia: Secondary | ICD-10-CM | POA: Diagnosis present

## 2022-07-23 MED ORDER — EPOETIN ALFA-EPBX 10000 UNIT/ML IJ SOLN
10000.0000 [IU] | INTRAMUSCULAR | Status: DC
  Administered 2022-07-23: 10000 [IU] via SUBCUTANEOUS

## 2022-07-23 MED ORDER — EPOETIN ALFA-EPBX 10000 UNIT/ML IJ SOLN
INTRAMUSCULAR | Status: AC
  Filled 2022-07-23: qty 1

## 2022-08-06 ENCOUNTER — Encounter (HOSPITAL_COMMUNITY)
Admission: RE | Admit: 2022-08-06 | Discharge: 2022-08-06 | Disposition: A | Discharge status: Home / Self Care | Payer: Medicare Other | Source: Ambulatory Visit | Attending: Nephrology | Admitting: Nephrology

## 2022-08-06 VITALS — BP 124/72 | HR 101 | Temp 96.7°F | Resp 18

## 2022-08-06 DIAGNOSIS — D649 Anemia, unspecified: Secondary | ICD-10-CM | POA: Diagnosis present

## 2022-08-06 DIAGNOSIS — D62 Acute posthemorrhagic anemia: Secondary | ICD-10-CM | POA: Insufficient documentation

## 2022-08-06 DIAGNOSIS — N179 Acute kidney failure, unspecified: Secondary | ICD-10-CM | POA: Diagnosis not present

## 2022-08-06 LAB — IRON AND TIBC
Iron: 66 ug/dL (ref 45–182)
Saturation Ratios: 20 % (ref 17.9–39.5)
TIBC: 328 ug/dL (ref 250–450)
UIBC: 262 ug/dL

## 2022-08-06 LAB — FERRITIN: Ferritin: 24 ng/mL (ref 24–336)

## 2022-08-06 LAB — POCT HEMOGLOBIN-HEMACUE
Hemoglobin: 10.7 g/dL — ABNORMAL LOW (ref 13.0–17.0)
Hemoglobin: 10.5 g/dL — ABNORMAL LOW (ref 13.0–17.0)

## 2022-08-06 MED ORDER — EPOETIN ALFA-EPBX 10000 UNIT/ML IJ SOLN
INTRAMUSCULAR | Status: AC
  Administered 2022-08-06: 10000 [IU] via SUBCUTANEOUS
  Filled 2022-08-06: qty 1

## 2022-08-06 MED ORDER — EPOETIN ALFA-EPBX 10000 UNIT/ML IJ SOLN: 10000.0000 [IU] | INTRAMUSCULAR | Status: DC

## 2022-08-20 ENCOUNTER — Encounter (HOSPITAL_COMMUNITY)
Admission: RE | Admit: 2022-08-20 | Discharge: 2022-08-20 | Disposition: A | Discharge status: Home / Self Care | Payer: Medicare Other | Source: Ambulatory Visit | Attending: Nephrology | Admitting: Nephrology

## 2022-08-20 VITALS — BP 127/67 | HR 97 | Temp 97.1°F

## 2022-08-20 DIAGNOSIS — D62 Acute posthemorrhagic anemia: Secondary | ICD-10-CM

## 2022-08-20 DIAGNOSIS — N179 Acute kidney failure, unspecified: Secondary | ICD-10-CM

## 2022-08-20 DIAGNOSIS — D649 Anemia, unspecified: Secondary | ICD-10-CM

## 2022-08-20 LAB — POCT HEMOGLOBIN-HEMACUE: Hemoglobin: 10.7 g/dL — ABNORMAL LOW (ref 13.0–17.0)

## 2022-08-20 MED ORDER — EPOETIN ALFA-EPBX 10000 UNIT/ML IJ SOLN
INTRAMUSCULAR | Status: AC
  Filled 2022-08-20: qty 1

## 2022-08-20 MED ORDER — EPOETIN ALFA-EPBX 10000 UNIT/ML IJ SOLN
10000.0000 [IU] | INTRAMUSCULAR | Status: DC
  Administered 2022-08-20: 10000 [IU] via SUBCUTANEOUS

## 2022-09-03 ENCOUNTER — Encounter (HOSPITAL_COMMUNITY)
Admission: RE | Admit: 2022-09-03 | Discharge: 2022-09-03 | Disposition: A | Discharge status: Home / Self Care | Payer: Medicare Other | Source: Ambulatory Visit | Attending: Nephrology | Admitting: Nephrology

## 2022-09-03 VITALS — BP 150/86 | HR 91 | Temp 97.4°F | Resp 20

## 2022-09-03 DIAGNOSIS — N179 Acute kidney failure, unspecified: Secondary | ICD-10-CM | POA: Insufficient documentation

## 2022-09-03 DIAGNOSIS — D62 Acute posthemorrhagic anemia: Secondary | ICD-10-CM | POA: Insufficient documentation

## 2022-09-03 DIAGNOSIS — D649 Anemia, unspecified: Secondary | ICD-10-CM | POA: Diagnosis present

## 2022-09-03 LAB — IRON AND TIBC
Iron: 85 ug/dL (ref 45–182)
Saturation Ratios: 26 % (ref 17.9–39.5)
TIBC: 330 ug/dL (ref 250–450)
UIBC: 245 ug/dL

## 2022-09-03 LAB — FERRITIN: Ferritin: 19 ng/mL — ABNORMAL LOW (ref 24–336)

## 2022-09-03 LAB — POCT HEMOGLOBIN-HEMACUE: Hemoglobin: 10.7 g/dL — ABNORMAL LOW (ref 13.0–17.0)

## 2022-09-03 MED ORDER — EPOETIN ALFA-EPBX 10000 UNIT/ML IJ SOLN
10000.0000 [IU] | INTRAMUSCULAR | Status: DC
  Administered 2022-09-03: 10000 [IU] via SUBCUTANEOUS

## 2022-09-03 MED ORDER — EPOETIN ALFA-EPBX 10000 UNIT/ML IJ SOLN
INTRAMUSCULAR | Status: AC
  Filled 2022-09-03: qty 1

## 2022-09-06 ENCOUNTER — Other Ambulatory Visit: Payer: Self-pay | Admitting: Cardiology

## 2022-09-10 ENCOUNTER — Encounter (HOSPITAL_COMMUNITY): Payer: Self-pay

## 2022-09-10 ENCOUNTER — Other Ambulatory Visit: Payer: Self-pay

## 2022-09-10 ENCOUNTER — Emergency Department (HOSPITAL_BASED_OUTPATIENT_CLINIC_OR_DEPARTMENT_OTHER): Payer: Medicare Other

## 2022-09-10 ENCOUNTER — Inpatient Hospital Stay (HOSPITAL_BASED_OUTPATIENT_CLINIC_OR_DEPARTMENT_OTHER)
Admission: EM | Admit: 2022-09-10 | Discharge: 2022-09-12 | DRG: 189 | Disposition: A | Discharge status: Home / Self Care | Payer: Medicare Other | Source: Ambulatory Visit | Attending: Family Medicine | Admitting: Family Medicine

## 2022-09-10 ENCOUNTER — Encounter (HOSPITAL_BASED_OUTPATIENT_CLINIC_OR_DEPARTMENT_OTHER): Payer: Self-pay | Admitting: Emergency Medicine

## 2022-09-10 DIAGNOSIS — J9601 Acute respiratory failure with hypoxia: Principal | ICD-10-CM | POA: Diagnosis present

## 2022-09-10 DIAGNOSIS — Z794 Long term (current) use of insulin: Secondary | ICD-10-CM

## 2022-09-10 DIAGNOSIS — E785 Hyperlipidemia, unspecified: Secondary | ICD-10-CM | POA: Diagnosis present

## 2022-09-10 DIAGNOSIS — E1140 Type 2 diabetes mellitus with diabetic neuropathy, unspecified: Secondary | ICD-10-CM | POA: Diagnosis present

## 2022-09-10 DIAGNOSIS — Z1152 Encounter for screening for COVID-19: Secondary | ICD-10-CM

## 2022-09-10 DIAGNOSIS — E1165 Type 2 diabetes mellitus with hyperglycemia: Secondary | ICD-10-CM

## 2022-09-10 DIAGNOSIS — Z79899 Other long term (current) drug therapy: Secondary | ICD-10-CM

## 2022-09-10 DIAGNOSIS — F39 Unspecified mood [affective] disorder: Secondary | ICD-10-CM | POA: Diagnosis present

## 2022-09-10 DIAGNOSIS — K219 Gastro-esophageal reflux disease without esophagitis: Secondary | ICD-10-CM | POA: Diagnosis present

## 2022-09-10 DIAGNOSIS — Z888 Allergy status to other drugs, medicaments and biological substances status: Secondary | ICD-10-CM

## 2022-09-10 DIAGNOSIS — L84 Corns and callosities: Secondary | ICD-10-CM | POA: Diagnosis present

## 2022-09-10 DIAGNOSIS — T383X6A Underdosing of insulin and oral hypoglycemic [antidiabetic] drugs, initial encounter: Secondary | ICD-10-CM | POA: Diagnosis present

## 2022-09-10 DIAGNOSIS — I10 Essential (primary) hypertension: Secondary | ICD-10-CM | POA: Diagnosis present

## 2022-09-10 DIAGNOSIS — F17210 Nicotine dependence, cigarettes, uncomplicated: Secondary | ICD-10-CM | POA: Diagnosis present

## 2022-09-10 DIAGNOSIS — N185 Chronic kidney disease, stage 5: Secondary | ICD-10-CM | POA: Diagnosis present

## 2022-09-10 DIAGNOSIS — Z91048 Other nonmedicinal substance allergy status: Secondary | ICD-10-CM

## 2022-09-10 DIAGNOSIS — E1122 Type 2 diabetes mellitus with diabetic chronic kidney disease: Secondary | ICD-10-CM | POA: Diagnosis present

## 2022-09-10 DIAGNOSIS — N186 End stage renal disease: Secondary | ICD-10-CM | POA: Diagnosis present

## 2022-09-10 DIAGNOSIS — Z9641 Presence of insulin pump (external) (internal): Secondary | ICD-10-CM | POA: Diagnosis present

## 2022-09-10 DIAGNOSIS — Z89511 Acquired absence of right leg below knee: Secondary | ICD-10-CM

## 2022-09-10 DIAGNOSIS — I152 Hypertension secondary to endocrine disorders: Secondary | ICD-10-CM | POA: Diagnosis present

## 2022-09-10 DIAGNOSIS — Z7960 Long term (current) use of unspecified immunomodulators and immunosuppressants: Secondary | ICD-10-CM

## 2022-09-10 DIAGNOSIS — B348 Other viral infections of unspecified site: Secondary | ICD-10-CM | POA: Diagnosis present

## 2022-09-10 DIAGNOSIS — Z91128 Patient's intentional underdosing of medication regimen for other reason: Secondary | ICD-10-CM

## 2022-09-10 DIAGNOSIS — F17200 Nicotine dependence, unspecified, uncomplicated: Secondary | ICD-10-CM | POA: Diagnosis present

## 2022-09-10 DIAGNOSIS — Z8249 Family history of ischemic heart disease and other diseases of the circulatory system: Secondary | ICD-10-CM

## 2022-09-10 DIAGNOSIS — Z833 Family history of diabetes mellitus: Secondary | ICD-10-CM

## 2022-09-10 DIAGNOSIS — Z91148 Patient's other noncompliance with medication regimen for other reason: Secondary | ICD-10-CM

## 2022-09-10 DIAGNOSIS — I120 Hypertensive chronic kidney disease with stage 5 chronic kidney disease or end stage renal disease: Secondary | ICD-10-CM | POA: Diagnosis present

## 2022-09-10 DIAGNOSIS — Z818 Family history of other mental and behavioral disorders: Secondary | ICD-10-CM

## 2022-09-10 LAB — BASIC METABOLIC PANEL
Anion gap: 8 (ref 5–15)
BUN: 70 mg/dL — ABNORMAL HIGH (ref 6–20)
CO2: 21 mmol/L — ABNORMAL LOW (ref 22–32)
Calcium: 7.8 mg/dL — ABNORMAL LOW (ref 8.9–10.3)
Chloride: 112 mmol/L — ABNORMAL HIGH (ref 98–111)
Creatinine, Ser: 5.99 mg/dL — ABNORMAL HIGH (ref 0.61–1.24)
GFR, Estimated: 11 mL/min — ABNORMAL LOW (ref >60)
Glucose, Bld: 145 mg/dL — ABNORMAL HIGH (ref 70–99)
Potassium: 4.2 mmol/L (ref 3.5–5.1)
Sodium: 141 mmol/L (ref 135–145)

## 2022-09-10 LAB — CBC
HCT: 30 % — ABNORMAL LOW (ref 39.0–52.0)
Hemoglobin: 9.2 g/dL — ABNORMAL LOW (ref 13.0–17.0)
MCH: 28 pg (ref 26.0–34.0)
MCHC: 30.7 g/dL (ref 30.0–36.0)
MCV: 91.2 fL (ref 80.0–100.0)
Platelets: 188 10*3/uL (ref 150–400)
RBC: 3.29 MIL/uL — ABNORMAL LOW (ref 4.22–5.81)
RDW: 15.3 % (ref 11.5–15.5)
WBC: 7.8 10*3/uL (ref 4.0–10.5)
nRBC: 0 % (ref 0.0–0.2)

## 2022-09-10 LAB — LACTIC ACID, PLASMA: Lactic Acid, Venous: 1.1 mmol/L (ref 0.5–1.9)

## 2022-09-10 LAB — RESP PANEL BY RT-PCR (FLU A&B, COVID) ARPGX2
Influenza A by PCR: NEGATIVE
Influenza B by PCR: NEGATIVE
SARS Coronavirus 2 by RT PCR: NEGATIVE

## 2022-09-10 LAB — TROPONIN I (HIGH SENSITIVITY)
Troponin I (High Sensitivity): 19 ng/L — ABNORMAL HIGH (ref <18)
Troponin I (High Sensitivity): 18 ng/L — ABNORMAL HIGH (ref <18)

## 2022-09-10 LAB — BRAIN NATRIURETIC PEPTIDE: B Natriuretic Peptide: 33 pg/mL (ref 0.0–100.0)

## 2022-09-10 MED ORDER — ALBUTEROL SULFATE HFA 108 (90 BASE) MCG/ACT IN AERS
2.0000 | INHALATION_SPRAY | Freq: Once | RESPIRATORY_TRACT | Status: AC
  Administered 2022-09-10: 2 via RESPIRATORY_TRACT
  Filled 2022-09-10: qty 6.7

## 2022-09-10 MED ORDER — IPRATROPIUM-ALBUTEROL 0.5-2.5 (3) MG/3ML IN SOLN
3.0000 mL | Freq: Once | RESPIRATORY_TRACT | Status: AC
  Administered 2022-09-10: 3 mL via RESPIRATORY_TRACT
  Filled 2022-09-10: qty 3

## 2022-09-10 MED ORDER — SODIUM CHLORIDE 0.9 % IV BOLUS
500.0000 mL | Freq: Once | INTRAVENOUS | Status: AC
  Administered 2022-09-10: 500 mL via INTRAVENOUS

## 2022-09-10 MED ORDER — LORAZEPAM 2 MG/ML IJ SOLN
INTRAMUSCULAR | Status: AC
  Filled 2022-09-10: qty 1

## 2022-09-10 MED ORDER — PREDNISONE 50 MG PO TABS
60.0000 mg | ORAL_TABLET | Freq: Once | ORAL | Status: AC
  Administered 2022-09-10: 60 mg via ORAL
  Filled 2022-09-10: qty 1

## 2022-09-10 MED ORDER — IPRATROPIUM-ALBUTEROL 0.5-2.5 (3) MG/3ML IN SOLN: 3.0000 mL | RESPIRATORY_TRACT | Status: DC

## 2022-09-10 NOTE — ED Triage Notes (Signed)
Productive cough, nasal congestion and runny nose x 3 days. Sent here by PCP due to low o2 levels (87%-91%)

## 2022-09-10 NOTE — ED Notes (Signed)
PT ambulated on RA. SPO2 92-93%, HR 120s.

## 2022-09-10 NOTE — Plan of Care (Signed)
[facility] will assume care on arrival to accepting facility. Until arrival, care as per EDP. However, [facility] available 24/7 for questions and assistance.  Nursing staff, please page [facility] Admits and Consults ([phone]) as soon as the patient arrives to the hospital.   

## 2022-09-10 NOTE — ED Notes (Signed)
BC x 2 obtained (site #1 Rt AC / site #2 Rt hand)

## 2022-09-10 NOTE — ED Provider Notes (Signed)
[facility] EMERGENCY DEPARTMENT Provider Note   CSN: [id number] Arrival date & time: 09/10/22  1432     History  Chief Complaint  Patient presents with   Cough    [name] [name] is a 51 y.o. male.  [name] [name] is a 51 y.o. male with a history of acute on chronic kidney disease, hypertension, hyperlipidemia, depression, diabetes, neuropathy, who presents to the emergency department from PCP office for evaluation of productive cough, congestion, runny nose for 3 days.  Saw his primary care provider today and was sent to the ED due to O2 levels of 87-91 noted in the office.  Patient denies feeling short of breath but reports feeling generally poor and malaised.  Patient has also been followed closely by Dr. Royce Macadamia with nephrology for worsening renal function, not currently on dialysis.  Patient denies lower extremity swelling.  Denies chest pain.  He reports some low-grade fevers.  Patient denies known lung disease.  Does report he is a current smoker.  No other aggravating or alleviating factors.  The history is provided by the patient and medical records.  Cough Associated symptoms: chills, fever and rhinorrhea   Associated symptoms: no chest pain        Home Medications Prior to Admission medications   Medication Sig Start Date End Date Taking? Authorizing Provider  Truddie Crumble ULTRA-THIN LANCETS MISC Check blood sugars before meals twice daily 11/19/17   Mack Hook, MD  ARIPiprazole (ABILIFY) 20 MG tablet Take 20 mg by mouth daily. 08/06/19   [provider]  baclofen (LIORESAL) 10 MG tablet 1/2 tab twice a day Patient taking differently: Take 20 mg by mouth 2 (two) times daily as needed for muscle spasms. 01/28/18   Mack Hook, MD  diltiazem (CARDIZEM CD) 300 MG 24 hr capsule Take 1 capsule (300 mg total) by mouth at bedtime. 07/20/22   Baldwin Jamaica, PA-C  doxycycline (VIBRAMYCIN) 100 MG capsule Take 1 capsule (100 mg total) by  mouth 2 (two) times daily. 07/03/22   Varney Biles, MD  FARXIGA 10 MG TABS tablet Take 10 mg by mouth daily. 09/03/21   [provider]  furosemide (LASIX) 40 MG tablet Take 40 mg by mouth daily.    [provider]  gabapentin (NEURONTIN) 300 MG capsule 1 cap by mouth in morning and midday, 2 caps by mouth at bedtime Patient taking differently: Take 300-600 mg by mouth See admin instructions. Take one capsule (300 mg) by mouth in the morning and afternoon, take two capsules (600 mg) at bedtime 11/19/17   Mack Hook, MD  glucose blood (AGAMATRIX PRESTO TEST) test strip Check blood sugars twice daily before meals 02/02/18   Mack Hook, MD  glucose blood test strip 1 each by Other route as needed for other. Use as instructed    [provider]  Insulin Disposable Pump (OMNIPOD DASH PODS, GEN 4,) MISC SMARTSIG:SUB-Q Every 3 Days 04/15/22   [provider]  insulin lispro (HUMALOG) 100 UNIT/ML injection Use as directed via insulin pump. MAX TDD 30 units 04/10/22   [provider]  metoCLOPramide (REGLAN) 10 MG tablet Take 1 tablet (10 mg total) by mouth 2 (two) times daily. 09/09/21   Doran Stabler, MD  ondansetron (ZOFRAN) 4 MG tablet Take 1 tablet (4 mg total) by mouth 3 (three) times daily as needed for nausea or vomiting. 09/05/21   Sharyn Creamer, MD  oxyCODONE-acetaminophen (PERCOCET/ROXICET) 5-325 MG tablet Take 1 tablet by mouth every  6 (six) hours as needed for up to 10 doses for severe pain. 07/03/22   Levie Heritage, MD  pantoprazole (PROTONIX) 40 MG tablet Take 1 tablet (40 mg total) by mouth 2 (two) times daily. 04/23/21   Daleen Bo, MD  sucralfate (CARAFATE) 1 GM/10ML suspension Take 10 mLs (1 g total) by mouth 4 (four) times daily -  with meals and at bedtime. 09/03/21   Richarda Osmond, MD  UNABLE TO FIND Take 0.25 mcg by mouth 3 (three) times a week. Med Name: catirol    [provider]      Allergies     Iron and Tape    Review of Systems   Review of Systems  Constitutional:  Positive for chills, fatigue and fever.  HENT:  Positive for congestion and rhinorrhea.   Respiratory:  Positive for cough.   Cardiovascular:  Negative for chest pain and leg swelling.  Gastrointestinal:  Negative for abdominal pain, nausea and vomiting.  Neurological:  Positive for weakness (Generalized).    Physical Exam Updated Vital Signs BP 139/81   Pulse 98   Temp 98.5 F (36.9 C) (Oral)   Resp 18   Ht '6\' 5"'$  (1.956 m)   Wt 112 kg   SpO2 92%   BMI 29.29 kg/m  Physical Exam Constitutional:      Appearance: He is ill-appearing.  HENT:     Head: Normocephalic and atraumatic.     Nose: Congestion and rhinorrhea present.     Mouth/Throat:     Mouth: Mucous membranes are moist.     Pharynx: Oropharynx is clear. Posterior oropharyngeal erythema present. No oropharyngeal exudate.  Eyes:     General:        Right eye: No discharge.        Left eye: No discharge.     Pupils: Pupils are equal, round, and reactive to light.  Cardiovascular:     Rate and Rhythm: Regular rhythm. Tachycardia present.     Pulses: Normal pulses.     Heart sounds: Normal heart sounds.  Pulmonary:     Comments: Patient is mildly tachypneic, frequent coughing during exam.  On auscultation patient with coarse breath sounds in upper lung fields, diminished air movement in the bases, patient with poor inspiratory effort. Abdominal:     General: Bowel sounds are normal. There is no distension.     Palpations: Abdomen is soft. There is no mass.     Tenderness: There is no abdominal tenderness. There is no guarding.  Musculoskeletal:     Cervical back: Neck supple.     Comments: Right AKA noted, no edema skin left lower extremity, chronic appearing dry skin, distal pulses intact  Skin:    General: Skin is warm and dry.  Neurological:     General: No focal deficit present.     Mental Status: He is alert and oriented to person,  place, and time.  Psychiatric:        Mood and Affect: Affect is flat.        Behavior: Behavior is withdrawn.     ED Results / Procedures / Treatments   Labs (all labs ordered are listed, but only abnormal results are displayed) Labs Reviewed  BASIC METABOLIC PANEL - Abnormal; Notable for the following components:      Result Value   Chloride 112 (*)    CO2 21 (*)    Glucose, Bld 145 (*)    BUN 70 (*)    Creatinine, Ser  5.99 (*)    Calcium 7.8 (*)    GFR, Estimated 11 (*)    All other components within normal limits  CBC - Abnormal; Notable for the following components:   RBC 3.29 (*)    Hemoglobin 9.2 (*)    HCT 30.0 (*)    All other components within normal limits  TROPONIN I (HIGH SENSITIVITY) - Abnormal; Notable for the following components:   Troponin I (High Sensitivity) 19 (*)    All other components within normal limits  TROPONIN I (HIGH SENSITIVITY) - Abnormal; Notable for the following components:   Troponin I (High Sensitivity) 18 (*)    All other components within normal limits  RESP PANEL BY RT-PCR (FLU A&B, COVID) ARPGX2  CULTURE, BLOOD (ROUTINE X 2)  CULTURE, BLOOD (ROUTINE X 2)  LACTIC ACID, PLASMA  BRAIN NATRIURETIC PEPTIDE  LACTIC ACID, PLASMA    EKG EKG Interpretation  Date/Time:  Thursday September 10 2022 14:48:05 EDT Ventricular Rate:  103 PR Interval:  154 QRS Duration: 84 QT Interval:  328 QTC Calculation: 429 R Axis:   66 Text Interpretation: Sinus tachycardia Otherwise normal ECG When compared with ECG of 12-Dec-2021 13:55, PREVIOUS ECG IS PRESENT No significant change since last tracing Confirmed by Blanchie Dessert [phone]) on 09/10/2022 3:58:51 PM  Radiology CT Chest Wo Contrast  Result Date: 09/10/2022 CLINICAL DATA:  Respiratory illness, nondiagnostic xray EXAM: CT CHEST WITHOUT CONTRAST TECHNIQUE: Multidetector CT imaging of the chest was performed following the standard protocol without IV contrast. RADIATION DOSE REDUCTION: This  exam was performed according to the departmental dose-optimization program which includes automated exposure control, adjustment of the mA and/or kV according to patient size and/or use of iterative reconstruction technique. COMPARISON:  CT chest 06/25/2022, CT abdomen pelvis 08/18/2019, MRI chest 07/03/2022 FINDINGS: Cardiovascular: Normal heart size. No significant pericardial effusion. The thoracic aorta is normal in caliber. No atherosclerotic plaque of the thoracic aorta. No coronary artery calcifications. Mediastinum/Nodes: Persistent prominent but nonenlarged mediastinal lymph nodes. No gross hilar adenopathy, noting limited sensitivity for the detection of hilar adenopathy on this noncontrast study. No enlarged mediastinal or axillary lymph nodes. Thyroid gland, trachea, and esophagus demonstrate no significant findings. Lungs/Pleura: Diffuse mild bronchial wall thickening. No focal consolidation. Stable 3 mm subpleural micronodule within the right upper lobe with a triangular morphology suggestive of an intrapulmonary lymph node. No new pulmonary nodule. No pulmonary mass. No pleural effusion. No pneumothorax. Upper Abdomen: Similar-appearing left adrenal gland hyperplasia with associated 1.3 cm fluid densely left adrenal gland nodule likely representing an adenoma. Musculoskeletal: No chest wall abnormality. Likely interval resection of left posterolateral lower soft tissue density. No suspicious lytic or blastic osseous lesions. No acute displaced fracture. Multilevel degenerative changes of the spine. IMPRESSION: 1. No acute intrathoracic abnormality. 2. Persistent prominent but nonenlarged mediastinal lymph nodes. Recommend attention on follow-up. Electronically Signed   By: Iven Finn M.D.   On: 09/10/2022 17:29   DG Chest Port 1 View  Result Date: 09/10/2022 CLINICAL DATA:  10031 Cough 786.2.ICD-9-CM. Productive cough. Nasal congestion. EXAM: PORTABLE CHEST 1 VIEW COMPARISON:  Chest x-ray  12/12/2021 FINDINGS: The heart and mediastinal contours are within normal limits. No focal consolidation. No pulmonary edema. No pleural effusion. No pneumothorax. No acute osseous abnormality. IMPRESSION: No active disease with limited evaluation due to patient positioning. Electronically Signed   By: Iven Finn M.D.   On: 09/10/2022 15:16    Procedures Procedures    Medications Ordered in ED Medications  sodium chloride 0.9 %  bolus 500 mL (500 mLs Intravenous New Bag/Given 09/10/22 1704)    ED Course/ Medical Decision Making/ A&P                           Medical Decision Making Amount and/or Complexity of Data Reviewed Labs: ordered. Radiology: ordered.   51 y.o. male presents to the ED with complaints of cough, congestion, low O2 sats at PCP office, this involves an extensive number of treatment options, and is a complaint that carries with it a high risk of complications and morbidity.  The differential diagnosis includes pneumonia, viral upper respiratory infection, COVID, flu, COPD, CHF, fluid overload in the setting of acute on chronic renal failure, PE   On arrival pt is nontoxic, vitals significant for mild tachycardia with heart rate of 103, patient mildly hypertensive, afebrile, sats in the low to mid 90s on room air, patient ambulated in the department and did not desat.  After coughing patient coughed up mucus and sats seem to improve.  Additional history obtained from chart review. Previous records obtained and reviewed   I ordered medication including DuoNeb and prednisone  Lab Tests:  I Ordered, reviewed, and interpreted labs, which included: No leukocytosis, stable chronic anemia, glucose of 145, no other significant electrolyte derangements, creatinine slightly worsened from baseline, 5.99, previously between 4 and 5.33, BUN of 70, no anion gap, lactic acid is normal, COVID and flu negative, comprehensive RVP pending, BNP negative, troponin stable between 18-19.   Blood cultures pending.  Imaging Studies ordered:  I ordered imaging studies which included chest x-ray and Noncon chest CT, I independently visualized and interpreted imaging which showed poor quality chest x-ray due to poor inspiratory effort, no active disease noted.  CT ordered due to limited interpretation of chest x-ray, no acute pneumonia or pulmonary edema, persistent prominent but nonenlarged lymph nodes noted.  ED Course:   Initially patient maintaining O2 sats, High clinical suspicion for viral respiratory illness and likely also underlying COPD given ongoing smoking.  Patient treated with steroids and nebulizer treatment.  Discussed with lab, RSV testing negative as well.  Awaiting comprehensive RVP.  After additional monitoring in the ED patient had persistent desaturations with O2 sats 87-88%.  Placed on 2 L nasal cannula and maintaining O2 sats in the low 90s.  Discussed with patient high clinical concern for further respiratory decompensation and recommended admission for continued treatment and respiratory support with oxygen.  Patient reports he is unsure if he wants to be admitted to the hospital and wants to make a phone call to speak with family member.  Patient with multiple chronic illnesses, severe stage V kidney disease, and likely underlying lung disease.  He is at high risk for decompensation and I do not think he would do well with discharge home.  At shift change patient is still speaking with family member trying to make decision about admission.  Case discussed with Dr. Maryan Rued who is seen and evaluated patient as well.  Dr. Maryan Rued will follow-up with patient on his decision regarding admission, I have discussed with patient risks for significant worsening if he were to go home, does not have oxygen support at home.  If patient declines admission will be discharged [place].  Portions of this note were generated with Lobbyist.  Dictation errors may occur despite best attempts at proofreading.         Final Clinical Impression(s) / ED Diagnoses Final diagnoses:  Acute hypoxic respiratory failure [facility])    Rx / DC Orders ED Discharge Orders     None         Jacqlyn Larsen, [place] 09/10/22 1932    Blanchie Dessert, MD 09/12/22 1523

## 2022-09-10 NOTE — ED Notes (Signed)
Resp Therapist aware of MDI order from ED MD

## 2022-09-10 NOTE — ED Notes (Signed)
Patient transported to CT via w/c. 

## 2022-09-10 NOTE — ED Notes (Signed)
Patient expressed desire to leave facility instead of being admitted. This nurse informed provider of patient decision. This nurse explained the admission process to the patient, who continued to express desire to go home.

## 2022-09-11 ENCOUNTER — Encounter (HOSPITAL_COMMUNITY): Payer: Self-pay | Admitting: Internal Medicine

## 2022-09-11 DIAGNOSIS — N186 End stage renal disease: Secondary | ICD-10-CM

## 2022-09-11 DIAGNOSIS — F39 Unspecified mood [affective] disorder: Secondary | ICD-10-CM | POA: Diagnosis present

## 2022-09-11 DIAGNOSIS — Z91048 Other nonmedicinal substance allergy status: Secondary | ICD-10-CM | POA: Diagnosis not present

## 2022-09-11 DIAGNOSIS — N185 Chronic kidney disease, stage 5: Secondary | ICD-10-CM | POA: Diagnosis present

## 2022-09-11 DIAGNOSIS — F17200 Nicotine dependence, unspecified, uncomplicated: Secondary | ICD-10-CM | POA: Diagnosis present

## 2022-09-11 DIAGNOSIS — Z79899 Other long term (current) drug therapy: Secondary | ICD-10-CM | POA: Diagnosis not present

## 2022-09-11 DIAGNOSIS — L84 Corns and callosities: Secondary | ICD-10-CM | POA: Diagnosis present

## 2022-09-11 DIAGNOSIS — B348 Other viral infections of unspecified site: Secondary | ICD-10-CM | POA: Diagnosis present

## 2022-09-11 DIAGNOSIS — I120 Hypertensive chronic kidney disease with stage 5 chronic kidney disease or end stage renal disease: Secondary | ICD-10-CM | POA: Diagnosis present

## 2022-09-11 DIAGNOSIS — Z7960 Long term (current) use of unspecified immunomodulators and immunosuppressants: Secondary | ICD-10-CM | POA: Diagnosis not present

## 2022-09-11 DIAGNOSIS — Z91128 Patient's intentional underdosing of medication regimen for other reason: Secondary | ICD-10-CM | POA: Diagnosis not present

## 2022-09-11 DIAGNOSIS — E785 Hyperlipidemia, unspecified: Secondary | ICD-10-CM | POA: Diagnosis present

## 2022-09-11 DIAGNOSIS — E1140 Type 2 diabetes mellitus with diabetic neuropathy, unspecified: Secondary | ICD-10-CM | POA: Diagnosis present

## 2022-09-11 DIAGNOSIS — Z888 Allergy status to other drugs, medicaments and biological substances status: Secondary | ICD-10-CM | POA: Diagnosis not present

## 2022-09-11 DIAGNOSIS — K219 Gastro-esophageal reflux disease without esophagitis: Secondary | ICD-10-CM | POA: Diagnosis present

## 2022-09-11 DIAGNOSIS — Z9641 Presence of insulin pump (external) (internal): Secondary | ICD-10-CM | POA: Diagnosis present

## 2022-09-11 DIAGNOSIS — E1165 Type 2 diabetes mellitus with hyperglycemia: Secondary | ICD-10-CM | POA: Diagnosis present

## 2022-09-11 DIAGNOSIS — Z818 Family history of other mental and behavioral disorders: Secondary | ICD-10-CM | POA: Diagnosis not present

## 2022-09-11 DIAGNOSIS — Z91148 Patient's other noncompliance with medication regimen for other reason: Secondary | ICD-10-CM | POA: Diagnosis not present

## 2022-09-11 DIAGNOSIS — Z1152 Encounter for screening for COVID-19: Secondary | ICD-10-CM | POA: Diagnosis not present

## 2022-09-11 DIAGNOSIS — J9601 Acute respiratory failure with hypoxia: Secondary | ICD-10-CM | POA: Diagnosis present

## 2022-09-11 DIAGNOSIS — I10 Essential (primary) hypertension: Secondary | ICD-10-CM | POA: Diagnosis not present

## 2022-09-11 DIAGNOSIS — Z89511 Acquired absence of right leg below knee: Secondary | ICD-10-CM | POA: Diagnosis not present

## 2022-09-11 DIAGNOSIS — T383X6A Underdosing of insulin and oral hypoglycemic [antidiabetic] drugs, initial encounter: Secondary | ICD-10-CM | POA: Diagnosis present

## 2022-09-11 DIAGNOSIS — E1122 Type 2 diabetes mellitus with diabetic chronic kidney disease: Secondary | ICD-10-CM | POA: Diagnosis present

## 2022-09-11 DIAGNOSIS — Z794 Long term (current) use of insulin: Secondary | ICD-10-CM | POA: Diagnosis not present

## 2022-09-11 DIAGNOSIS — F17210 Nicotine dependence, cigarettes, uncomplicated: Secondary | ICD-10-CM | POA: Diagnosis present

## 2022-09-11 HISTORY — DX: End stage renal disease: N18.6

## 2022-09-11 HISTORY — DX: Nicotine dependence, unspecified, uncomplicated: F17.200

## 2022-09-11 LAB — HEMOGLOBIN A1C
Hgb A1c MFr Bld: 7.2 % — ABNORMAL HIGH (ref 4.8–5.6)
Mean Plasma Glucose: 159.94 mg/dL

## 2022-09-11 LAB — RESPIRATORY PANEL BY PCR

## 2022-09-11 LAB — GLUCOSE, CAPILLARY
Glucose-Capillary: 110 mg/dL — ABNORMAL HIGH (ref 70–99)
Glucose-Capillary: 195 mg/dL — ABNORMAL HIGH (ref 70–99)

## 2022-09-11 LAB — CBG MONITORING, ED
Glucose-Capillary: 315 mg/dL — ABNORMAL HIGH (ref 70–99)
Glucose-Capillary: 252 mg/dL — ABNORMAL HIGH (ref 70–99)

## 2022-09-11 MED ORDER — INSULIN ASPART 100 UNIT/ML IJ SOLN
0.0000 [IU] | Freq: Three times a day (TID) | INTRAMUSCULAR | Status: DC
  Administered 2022-09-12: 2 [IU] via SUBCUTANEOUS

## 2022-09-11 MED ORDER — INSULIN ASPART 100 UNIT/ML IJ SOLN: 0.0000 [IU] | Freq: Every day | INTRAMUSCULAR | Status: DC

## 2022-09-11 MED ORDER — SUCRALFATE 1 GM/10ML PO SUSP
1.0000 g | Freq: Three times a day (TID) | ORAL | Status: DC
  Administered 2022-09-11: 1 g via ORAL
  Filled 2022-09-11 (×2): qty 10

## 2022-09-11 MED ORDER — ACETAMINOPHEN 650 MG RE SUPP: 650.0000 mg | Freq: Four times a day (QID) | RECTAL | Status: DC | PRN

## 2022-09-11 MED ORDER — HYDRALAZINE HCL 20 MG/ML IJ SOLN: 5.0000 mg | INTRAMUSCULAR | Status: DC | PRN

## 2022-09-11 MED ORDER — NICOTINE 14 MG/24HR TD PT24
14.0000 mg | MEDICATED_PATCH | Freq: Every day | TRANSDERMAL | Status: DC
  Filled 2022-09-11: qty 1

## 2022-09-11 MED ORDER — ALBUTEROL SULFATE (2.5 MG/3ML) 0.083% IN NEBU: 2.5000 mg | INHALATION_SOLUTION | RESPIRATORY_TRACT | Status: DC | PRN

## 2022-09-11 MED ORDER — GABAPENTIN 300 MG PO CAPS
300.0000 mg | ORAL_CAPSULE | Freq: Two times a day (BID) | ORAL | Status: DC
  Administered 2022-09-11 – 2022-09-12 (×2): 300 mg via ORAL
  Filled 2022-09-11 (×2): qty 1

## 2022-09-11 MED ORDER — DOCUSATE SODIUM 100 MG PO CAPS
100.0000 mg | ORAL_CAPSULE | Freq: Two times a day (BID) | ORAL | Status: DC
  Filled 2022-09-11: qty 1

## 2022-09-11 MED ORDER — PANTOPRAZOLE SODIUM 40 MG PO TBEC
40.0000 mg | DELAYED_RELEASE_TABLET | Freq: Two times a day (BID) | ORAL | Status: DC
  Administered 2022-09-11 – 2022-09-12 (×3): 40 mg via ORAL
  Filled 2022-09-11 (×3): qty 1

## 2022-09-11 MED ORDER — GABAPENTIN 300 MG PO CAPS: 300.0000 mg | ORAL_CAPSULE | ORAL | Status: DC

## 2022-09-11 MED ORDER — LACTATED RINGERS IV SOLN: INTRAVENOUS | Status: DC

## 2022-09-11 MED ORDER — ARIPIPRAZOLE 10 MG PO TABS
20.0000 mg | ORAL_TABLET | Freq: Every day | ORAL | Status: DC
  Administered 2022-09-11 – 2022-09-12 (×2): 20 mg via ORAL
  Filled 2022-09-11: qty 2
  Filled 2022-09-11: qty 4

## 2022-09-11 MED ORDER — FUROSEMIDE 40 MG PO TABS
40.0000 mg | ORAL_TABLET | Freq: Every day | ORAL | Status: DC
  Administered 2022-09-11: 40 mg via ORAL
  Filled 2022-09-11: qty 1

## 2022-09-11 MED ORDER — HEPARIN SODIUM (PORCINE) 5000 UNIT/ML IJ SOLN
5000.0000 [IU] | Freq: Three times a day (TID) | INTRAMUSCULAR | Status: DC
  Administered 2022-09-11: 5000 [IU] via SUBCUTANEOUS
  Filled 2022-09-11 (×3): qty 1

## 2022-09-11 MED ORDER — SODIUM CHLORIDE 0.9% FLUSH
3.0000 mL | Freq: Two times a day (BID) | INTRAVENOUS | Status: DC
  Administered 2022-09-11: 3 mL via INTRAVENOUS

## 2022-09-11 MED ORDER — ACETAMINOPHEN 325 MG PO TABS: 650.0000 mg | ORAL_TABLET | Freq: Four times a day (QID) | ORAL | Status: DC | PRN

## 2022-09-11 MED ORDER — DILTIAZEM HCL ER COATED BEADS 300 MG PO CP24
300.0000 mg | ORAL_CAPSULE | Freq: Every day | ORAL | Status: DC
  Administered 2022-09-11: 300 mg via ORAL
  Filled 2022-09-11 (×2): qty 1

## 2022-09-11 MED ORDER — BISACODYL 5 MG PO TBEC: 5.0000 mg | DELAYED_RELEASE_TABLET | Freq: Every day | ORAL | Status: DC | PRN

## 2022-09-11 MED ORDER — ONDANSETRON HCL 4 MG PO TABS: 4.0000 mg | ORAL_TABLET | Freq: Four times a day (QID) | ORAL | Status: DC | PRN

## 2022-09-11 MED ORDER — ONDANSETRON HCL 4 MG/2ML IJ SOLN: 4.0000 mg | Freq: Four times a day (QID) | INTRAMUSCULAR | Status: DC | PRN

## 2022-09-11 MED ORDER — METOCLOPRAMIDE HCL 10 MG PO TABS
10.0000 mg | ORAL_TABLET | Freq: Two times a day (BID) | ORAL | Status: DC
  Administered 2022-09-11 – 2022-09-12 (×3): 10 mg via ORAL
  Filled 2022-09-11 (×3): qty 1

## 2022-09-11 MED ORDER — GUAIFENESIN ER 600 MG PO TB12: 1200.0000 mg | ORAL_TABLET | Freq: Two times a day (BID) | ORAL | Status: DC | PRN

## 2022-09-11 MED ORDER — BACLOFEN 20 MG PO TABS: 20.0000 mg | ORAL_TABLET | Freq: Two times a day (BID) | ORAL | Status: DC | PRN

## 2022-09-11 MED ORDER — INSULIN ASPART 100 UNIT/ML IJ SOLN
0.0000 [IU] | Freq: Three times a day (TID) | INTRAMUSCULAR | Status: DC
  Administered 2022-09-11: 5 [IU] via SUBCUTANEOUS
  Administered 2022-09-11: 7 [IU] via SUBCUTANEOUS

## 2022-09-11 MED ORDER — GABAPENTIN 600 MG PO TABS
600.0000 mg | ORAL_TABLET | Freq: Every day | ORAL | Status: DC
  Administered 2022-09-11: 600 mg via ORAL
  Filled 2022-09-11: qty 1

## 2022-09-11 MED ORDER — POLYETHYLENE GLYCOL 3350 17 G PO PACK: 17.0000 g | PACK | Freq: Every day | ORAL | Status: DC | PRN

## 2022-09-11 NOTE — ED Notes (Signed)
[facility] called for patient pick up

## 2022-09-11 NOTE — ED Provider Notes (Signed)
8:27 AM pt evaluated, doing well, stable of 3l of oxygen. Being admitted for viral pneumonia, rhinovirus. SSI ordered as pump is not working.     Lennice Sites, DO 09/11/22 [phone]

## 2022-09-11 NOTE — Inpatient Diabetes Management (Signed)
Inpatient Diabetes Program Recommendations  AACE/ADA: New Consensus Statement on Inpatient Glycemic Control (2015)  Target Ranges:  Prepandial:   less than 140 mg/dL      Peak postprandial:   less than 180 mg/dL (1-2 hours)      Critically ill patients:  140 - 180 mg/dL   Lab Results  Component Value Date   GLUCAP 315 (H) 09/11/2022   HGBA1C 8.5 (H) 09/02/2021    Review of Glycemic Control  Latest Reference Range & Units 09/11/22 08:20  Glucose-Capillary 70 - 99 mg/dL 315 (H)  (H): Data is abnormally high  Diabetes history: DM2 Outpatient Diabetes medications:  Farxiga 10 mg QD Omnipod insulin pump & Dexcom Endo is [place]-last seen on 08/19/22 Current orders for Inpatient glycemic control:  Novolog 0-9 units TID and 0-5 QHS Prednisone 60 mg last evening  Inpatient Diabetes Program Recommendations:    Semglee 15 units QD Noovlog 3 units TID with meals if consumes at least 50%  Will plan on seeing patient once transferred for admission.  Will continue to follow while inpatient.  Thank you, Reche Dixon, MSN, [place] Diabetes Coordinator Inpatient Diabetes Program [phone] (team pager from 8a-5p)

## 2022-09-11 NOTE — ED Notes (Signed)
Report was given to brittany Rn

## 2022-09-11 NOTE — ED Notes (Signed)
Patients glucose monitor is off

## 2022-09-11 NOTE — ED Notes (Signed)
Patient assessment was done at 815. Alert x 4 breathing non labored on 3 liters nasal cannula .

## 2022-09-11 NOTE — ED Notes (Addendum)
Provider states to recheck blood sugar now and use sliding scale if needed even though insulin was given at 929

## 2022-09-11 NOTE — H&P (Addendum)
History and Physical    Patient: [name] [name] DOB: Jul 12, 1971 DOA: 09/10/2022 DOS: the patient was seen and examined on 09/11/2022 PCP: [name], NP  Patient coming from: Home - lives with mother; NOK: None   Chief Complaint: cough  HPI: [name] [name] is a 51 y.o. male with medical history significant of DM, HTN, HLD, PAD s/p R BKA, stage 5 CKD, and neuropathy presenting with cough.   He was at his PCP and she checked pulse ox; O2 was low (87%) and told him to come to the [facility].  He does not feel SOB.  Cough, productive of yellow sputum.  No prior O2 requirement.  He has had low-grade fever.  +wheezing, rhinorrhea.  +tobacco, 1/2 ppd.      He is s/p R BKA.  He has 2 calluses on his left foot.  He reports that he was seeing wound care but they referred him to [facility] and he can only have the calluses cut down every 3 months.   ER Course:  [facility] to [facility] transfer, per Dr. Marlowe Sax:  51 year old male with history of hypertension, hyperlipidemia, diabetes, CKD stage IV-V, PAD presented to ED with complaints of cough, rhinorrhea, and low-grade fevers.  He was satting 87% on room air at PCPs office, now on 2 L O2.  Creatinine 5.9, baseline 4-5.  CT showing prominent mediastinal lymph nodes but no other acute abnormality.  COVID and flu negative.  RVP pending.  Patient was given prednisone and bronchodilator treatments.     Review of Systems: As mentioned in the history of present illness. All other systems reviewed and are negative. Past Medical History:  Diagnosis Date   Allergy    seasonal and environmental   Anemia    ARF (acute renal failure) ([place]) 09/2017   Chronic constipation 05/13/2020   CKD (chronic kidney disease) stage 4, GFR 15-29 ml/min (HCC)    Depression    Diabetes mellitus without complication ([place]) 9675   GERD (gastroesophageal reflux disease)    Hyperlipidemia    Hypertension    Necrotizing fasciitis ([place]) 10/13/2017    Neuromuscular disorder (HCC)    neuropathy   PAD (peripheral artery disease) ([place])    Secondary hyperparathyroidism ([place])    Wound dehiscence 11/24/2017   Past Surgical History:  Procedure Laterality Date   AMPUTATION Right 10/13/2017   Procedure: RIGHT BELOW KNEE AMPUTATION;  Surgeon: [name], MD;  Location: [place];  Service: Orthopedics;  Laterality: Right;   AMPUTATION Right 11/24/2017   Procedure: AMPUTATION BELOW KNEE REVISION;  Surgeon: [name], MD;  Location: [place];  Service: Orthopedics;  Laterality: Right;   BIOPSY  09/02/2021   Procedure: BIOPSY;  Surgeon: Sharyn Creamer, MD;  Location: [facility] ENDOSCOPY;  Service: Gastroenterology;;   COLONOSCOPY  05/11/2019   ESOPHAGOGASTRODUODENOSCOPY (EGD) WITH PROPOFOL N/A 09/02/2021   Procedure: ESOPHAGOGASTRODUODENOSCOPY (EGD) WITH PROPOFOL;  Surgeon: Sharyn Creamer, MD;  Location: [place];  Service: Gastroenterology;  Laterality: N/A;   NO PAST SURGERIES     POLYPECTOMY     WISDOM TOOTH EXTRACTION     Social History:  reports that he has been smoking cigarettes. He has a 17.50 pack-year smoking history. He has never used smokeless tobacco. He reports that he does not drink alcohol and does not use drugs.  Allergies  Allergen Reactions   Iron Nausea And Vomiting    With the iron tablets   Tape Rash    Rash at site of  tape--okay to use paper tape    Family History  Problem Relation Age of Onset   Diabetes Mellitus II Mother    Depression Mother    Colon polyps Mother    Heart disease Father        CABG x 4.  04/2017   Colon cancer Neg Hx    Esophageal cancer Neg Hx    Liver cancer Neg Hx    Pancreatic cancer Neg Hx    Rectal cancer Neg Hx    Stomach cancer Neg Hx     Prior to Admission medications   Medication Sig Start Date End Date Taking? Authorizing Provider  ARIPiprazole (ABILIFY) 20 MG tablet Take 20 mg by mouth daily. 08/06/19  Yes [provider]  baclofen (LIORESAL) 10 MG tablet 1/2 tab twice  a day Patient taking differently: Take 20 mg by mouth 2 (two) times daily as needed for muscle spasms. 01/28/18  Yes Mack Hook, MD  diltiazem (CARDIZEM CD) 300 MG 24 hr capsule Take 1 capsule (300 mg total) by mouth at bedtime. 07/20/22  Yes Baldwin Jamaica, PA-C  doxycycline (VIBRAMYCIN) 100 MG capsule Take 1 capsule (100 mg total) by mouth 2 (two) times daily. 07/03/22  Yes Nanavati, Ankit, MD  FARXIGA 10 MG TABS tablet Take 10 mg by mouth daily. 09/03/21  Yes [provider]  furosemide (LASIX) 40 MG tablet Take 40 mg by mouth daily.   Yes [provider]  gabapentin (NEURONTIN) 300 MG capsule 1 cap by mouth in morning and midday, 2 caps by mouth at bedtime Patient taking differently: Take 300-600 mg by mouth See admin instructions. Take one capsule (300 mg) by mouth in the morning and afternoon, take two capsules (600 mg) at bedtime 11/19/17  Yes Mack Hook, MD  metoCLOPramide (REGLAN) 10 MG tablet Take 1 tablet (10 mg total) by mouth 2 (two) times daily. 09/09/21  Yes Danis, Kirke Corin, MD  ondansetron (ZOFRAN) 4 MG tablet Take 1 tablet (4 mg total) by mouth 3 (three) times daily as needed for nausea or vomiting. 09/05/21  Yes Sharyn Creamer, MD  oxyCODONE-acetaminophen (PERCOCET/ROXICET) 5-325 MG tablet Take 1 tablet by mouth every 6 (six) hours as needed for up to 10 doses for severe pain. 07/03/22  Yes Levie Heritage, MD  pantoprazole (PROTONIX) 40 MG tablet Take 1 tablet (40 mg total) by mouth 2 (two) times daily. 04/23/21  Yes Daleen Bo, MD  sucralfate (CARAFATE) 1 GM/10ML suspension Take 10 mLs (1 g total) by mouth 4 (four) times daily -  with meals and at bedtime. 09/03/21  Yes Richarda Osmond, MD  Truddie Crumble ULTRA-THIN LANCETS MISC Check blood sugars before meals twice daily 11/19/17   Mack Hook, MD  glucose blood (AGAMATRIX PRESTO TEST) test strip Check blood sugars twice daily before meals 02/02/18   Mack Hook, MD  glucose  blood test strip 1 each by Other route as needed for other. Use as instructed    [provider]  Insulin Disposable Pump (OMNIPOD DASH PODS, GEN 4,) MISC SMARTSIG:SUB-Q Every 3 Days 04/15/22   [provider]  insulin lispro (HUMALOG) 100 UNIT/ML injection Use as directed via insulin pump. MAX TDD 30 units 04/10/22   [provider]  UNABLE TO FIND Take 0.25 mcg by mouth 3 (three) times a week. Med Name: catirol    [provider]    Physical Exam: Vitals:   09/11/22 1227 09/11/22 1342 09/11/22 1400 09/11/22 1600  BP:  (!) 164/94  Marland Kitchen)  149/88  Pulse:  100  94  Resp:  '13 16 14  '$ Temp: 98.2 F (36.8 C)   98.1 F (36.7 C)  TempSrc: Oral   Oral  SpO2:  97% 96% 93%  Weight:      Height:       General:  Appears calm and comfortable and is in NAD Eyes:  EOMI, normal lids, iris ENT:  grossly normal hearing, lips & tongue, mmm Neck:  no LAD, masses or thyromegaly Cardiovascular:  RRR, no m/r/g. No LE edema.  Respiratory:   CTA bilaterally with no wheezes/rales/rhonchi.  Normal respiratory effort. Abdomen:  soft, NT, ND Skin:  LLE is lichenified with reticular pattern.  L 1st MTP has a callus as does the base of the 5th metatarsal.  Neither appears actively infected.      Musculoskeletal:  s/p R BKA, prosthetic in place Lower extremity:  No LE edema.  Limited foot exam with calluses as above.  2+ distal pulses (somewhat surprisingly given the appearance of the lower leg). Psychiatric:  flat mood and affect, speech fluent and appropriate, AOx3 Neurologic:  CN 2-12 grossly intact, moves all extremities in coordinated fashion   Radiological Exams on Admission: Independently reviewed - see discussion in A/P where applicable  CT Chest Wo Contrast  Result Date: 09/10/2022 CLINICAL DATA:  Respiratory illness, nondiagnostic xray EXAM: CT CHEST WITHOUT CONTRAST TECHNIQUE: Multidetector CT imaging of the chest was performed following the standard protocol  without IV contrast. RADIATION DOSE REDUCTION: This exam was performed according to the departmental dose-optimization program which includes automated exposure control, adjustment of the mA and/or kV according to patient size and/or use of iterative reconstruction technique. COMPARISON:  CT chest 06/25/2022, CT abdomen pelvis 08/18/2019, MRI chest 07/03/2022 FINDINGS: Cardiovascular: Normal heart size. No significant pericardial effusion. The thoracic aorta is normal in caliber. No atherosclerotic plaque of the thoracic aorta. No coronary artery calcifications. Mediastinum/Nodes: Persistent prominent but nonenlarged mediastinal lymph nodes. No gross hilar adenopathy, noting limited sensitivity for the detection of hilar adenopathy on this noncontrast study. No enlarged mediastinal or axillary lymph nodes. Thyroid gland, trachea, and esophagus demonstrate no significant findings. Lungs/Pleura: Diffuse mild bronchial wall thickening. No focal consolidation. Stable 3 mm subpleural micronodule within the right upper lobe with a triangular morphology suggestive of an intrapulmonary lymph node. No new pulmonary nodule. No pulmonary mass. No pleural effusion. No pneumothorax. Upper Abdomen: Similar-appearing left adrenal gland hyperplasia with associated 1.3 cm fluid densely left adrenal gland nodule likely representing an adenoma. Musculoskeletal: No chest wall abnormality. Likely interval resection of left posterolateral lower soft tissue density. No suspicious lytic or blastic osseous lesions. No acute displaced fracture. Multilevel degenerative changes of the spine. IMPRESSION: 1. No acute intrathoracic abnormality. 2. Persistent prominent but nonenlarged mediastinal lymph nodes. Recommend attention on follow-up. Electronically Signed   By: Iven Finn M.D.   On: 09/10/2022 17:29   DG Chest Port 1 View  Result Date: 09/10/2022 CLINICAL DATA:  10031 Cough 786.2.ICD-9-CM. Productive cough. Nasal congestion.  EXAM: PORTABLE CHEST 1 VIEW COMPARISON:  Chest x-ray 12/12/2021 FINDINGS: The heart and mediastinal contours are within normal limits. No focal consolidation. No pulmonary edema. No pleural effusion. No pneumothorax. No acute osseous abnormality. IMPRESSION: No active disease with limited evaluation due to patient positioning. Electronically Signed   By: Iven Finn M.D.   On: 09/10/2022 15:16    EKG: Independently reviewed.  Sinus tachycardia with rate 103; no evidence of acute ischemia   Labs on Admission: I have personally  reviewed the available labs and imaging studies at the time of the admission.  Pertinent labs:    Glucose 145 BUN 70/Creatinine 5.99/GFR 11 HS troponin 19, 18 BNP 33 Lactate 1.1 WBC 7.8 Hgb 9.2 RVP + for rhinovirus/enterovirus A1c 7.2 COVID/flu negative Blood cultures pending   Assessment and Plan: Principal Problem:   Acute hypoxemic respiratory failure (HCC) Active Problems:   Uncontrolled type 2 diabetes mellitus with hyperglycemia, with long-term current use of insulin (HCC)   Essential hypertension   History of right below knee amputation (HCC)   Rhinovirus infection   Stage 5 chronic kidney disease (HCC)   Tobacco dependence    Acute respiratory failure with hypoxia from Rhinovirus -Patient presented to his PCP yesterday for c/o cough, was found to be hypoxic to the 80s and sent to [facility] -His lowest documented O2 sat there was 90% -Further evaluation showed + test for rhinovirus -He is also a smoker -Will admit to telemetry -Supportive care with weaning [place] O2 -Ambulatory pulse ox in the AM  Stage 5 CKD -Appears to be stable at this time -He is followed by Dr. Royce Macadamia from nephrology as an outpatient -He appears to need outpatient referral for HD access -Attempt to avoid nephrotoxic medications -Hold Farxiga, Lasix for now -Recheck BMP in AM   PAD -s/p R BKA -Also has foot calluses on the L, needs close podiatry f/u -He last saw  podiatry on 8/9 and is due for f/u in a couple of weeks (3 months) -He needs fitting for a new diabetic shoe -Continue baclofen  DM -A1c is 7.2, indicating reasonable control -hold [place] -He has an insulin pump but is not currently wearing it because it is out of insulin -Cover with sensitive-scale SSI for now -Continue gabapentin, metoclopramide  HTN -Continue diltiazem -Hold amlodipine - 2 CCBs are suboptimal  Mood d/o -His affect was quite flat at the time of my evaluation -Continue Abilify  GERD -Continue pantoprazole, sucralfate  Tobacco dependence -Encourage cessation.   -This was discussed with the patient and should be reviewed on an ongoing basis.   -Patch ordered  -He may have COPD contributing to his hypoxia; will do ambulatory pulse ox in the AM to see if he needs home O2.     Advance Care Planning:   Code Status: Full Code   Consults: None  DVT Prophylaxis: SQ Heparin  Family Communication: None present; he declined to have me call family at the time of admission  Severity of Illness: The appropriate patient status for this patient is INPATIENT. Inpatient status is judged to be reasonable and necessary in order to provide the required intensity of service to ensure the patient's safety. The patient's presenting symptoms, physical exam findings, and initial radiographic and laboratory data in the context of their chronic comorbidities is felt to place them at high risk for further clinical deterioration. Furthermore, it is not anticipated that the patient will be medically stable for discharge from the hospital within 2 midnights of admission.   * I certify that at the point of admission it is my clinical judgment that the patient will require inpatient hospital care spanning beyond 2 midnights from the point of admission due to high intensity of service, high risk for further deterioration and high frequency of surveillance required.*  Author: Karmen Bongo, MD 09/11/2022 4:31 PM  For on call review [email or web address].

## 2022-09-11 NOTE — ED Notes (Signed)
Care Link at bedside 

## 2022-09-11 NOTE — TOC Initial Note (Signed)
Transition of Care [facility]) - Initial/Assessment Note    Patient Details  Name: [name] [name] MRN: [id number] Date of Birth: [birth year]  Transition of Care [facility]) CM/SW Contact:    Verdell Carmine, RN Phone Number: 09/11/2022, 2:06 PM  Clinical Narrative:                  Patient presented with shortness of breath. Hypoxic on room air. Positive for Rhinovirus.  Hopeful that patient will wean off oxygen acutely. If not then will need walking oxygen qualifiers and home oxygen.  CM will follow for needs, recommendations, and transitions of care.   Expected Discharge Plan: Home/Self Care Barriers to Discharge: Continued Medical Work up   Patient Goals and CMS Choice        Expected Discharge Plan and Services Expected Discharge Plan: Home/Self Care       Living arrangements for the past 2 months: Single Family Home                                      Prior Living Arrangements/Services Living arrangements for the past 2 months: Single Family Home                     Activities of Daily Living      Permission Sought/Granted                  Emotional Assessment              Admission diagnosis:  Acute hypoxemic respiratory failure ([place]) [J96.01] Acute hypoxic respiratory failure ([place]) [J96.01] Patient Active Problem List   Diagnosis Date Noted   Acute hypoxemic respiratory failure ([place]) 09/10/2022   Acute renal failure superimposed on stage 3b chronic kidney disease ([place]) 09/02/2021   GERD with esophagitis 09/02/2021   Lactic acidosis 09/02/2021   Hematemesis 09/02/2021   Cellulitis 04/10/2021   Severe sepsis ([place]) 04/10/2021   AKI (acute kidney injury) ([place]) 04/10/2021   Cellulitis of both lower extremities 04/10/2021   Achilles tendon contracture, left 03/03/2018   Non-pressure chronic ulcer of other part of left foot limited to breakdown of skin ([place]) 02/02/2018   Ulnar nerve damage, initial encounter 01/05/2018   History of  right below knee amputation ([place]) 12/22/2017   Fever    Acute posthemorrhagic anemia    Leukocytosis    [place] 11/24/2017   Essential hypertension 11/24/2017   Normocytic normochromic anemia 11/24/2017   Sepsis ([place]) 10/11/2017   Uncontrolled type 2 diabetes mellitus with hyperglycemia, with long-term current use of insulin ([place]) 10/11/2017   ARF (acute renal failure) ([place]) 10/11/2017   Nausea & vomiting 10/11/2017   PCP:  [name], NP Pharmacy:   [facility] [place] [place] [address] Phone: [phone] Fax: [phone]     Social Determinants of Health (SDOH) Interventions    Readmission Risk Interventions     No data to display

## 2022-09-12 DIAGNOSIS — I10 Essential (primary) hypertension: Secondary | ICD-10-CM | POA: Diagnosis not present

## 2022-09-12 DIAGNOSIS — Z89511 Acquired absence of right leg below knee: Secondary | ICD-10-CM

## 2022-09-12 DIAGNOSIS — J9601 Acute respiratory failure with hypoxia: Secondary | ICD-10-CM | POA: Diagnosis not present

## 2022-09-12 LAB — BASIC METABOLIC PANEL
Anion gap: 10 (ref 5–15)
BUN: 77 mg/dL — ABNORMAL HIGH (ref 6–20)
CO2: 22 mmol/L (ref 22–32)
Calcium: 8.2 mg/dL — ABNORMAL LOW (ref 8.9–10.3)
Chloride: 111 mmol/L (ref 98–111)
Creatinine, Ser: 5.52 mg/dL — ABNORMAL HIGH (ref 0.61–1.24)
GFR, Estimated: 12 mL/min — ABNORMAL LOW (ref >60)
Glucose, Bld: 176 mg/dL — ABNORMAL HIGH (ref 70–99)
Potassium: 4.9 mmol/L (ref 3.5–5.1)
Sodium: 143 mmol/L (ref 135–145)

## 2022-09-12 LAB — CBC
HCT: 27.8 % — ABNORMAL LOW (ref 39.0–52.0)
Hemoglobin: 8.8 g/dL — ABNORMAL LOW (ref 13.0–17.0)
MCH: 28.4 pg (ref 26.0–34.0)
MCHC: 31.7 g/dL (ref 30.0–36.0)
MCV: 89.7 fL (ref 80.0–100.0)
Platelets: 192 10*3/uL (ref 150–400)
RBC: 3.1 MIL/uL — ABNORMAL LOW (ref 4.22–5.81)
RDW: 15.2 % (ref 11.5–15.5)
WBC: 10.2 10*3/uL (ref 4.0–10.5)
nRBC: 0 % (ref 0.0–0.2)

## 2022-09-12 LAB — HIV ANTIBODY (ROUTINE TESTING W REFLEX): HIV Screen 4th Generation wRfx: NONREACTIVE

## 2022-09-12 LAB — GLUCOSE, CAPILLARY
Glucose-Capillary: 172 mg/dL — ABNORMAL HIGH (ref 70–99)
Glucose-Capillary: 137 mg/dL — ABNORMAL HIGH (ref 70–99)

## 2022-09-12 MED ORDER — GUAIFENESIN ER 600 MG PO TB12: 600.0000 mg | ORAL_TABLET | Freq: Two times a day (BID) | ORAL | 0 refills | Status: AC

## 2022-09-12 MED ORDER — PREDNISONE 10 MG PO TABS: ORAL_TABLET | ORAL | 0 refills | Status: DC

## 2022-09-12 MED ORDER — DOXYCYCLINE HYCLATE 100 MG PO CAPS: 100.0000 mg | ORAL_CAPSULE | Freq: Two times a day (BID) | ORAL | 0 refills | Status: AC

## 2022-09-12 NOTE — Discharge Summary (Signed)
Physician Discharge Summary   Patient: [name] [name] MRN: [id number] DOB: 30-Oct-1971  Admit date:     09/10/2022  Discharge date: 09/12/22  Discharge Physician: Oswald Hillock   PCP: [name], NP   Recommendations at discharge:   Follow-up PCP as outpatient Recommend to repeat CT chest without contrast in 6 months to check for resolution of lymph nodes seen on CT chest  Discharge Diagnoses: Principal Problem:   Acute hypoxemic respiratory failure ([place]) Active Problems:   Uncontrolled type 2 diabetes mellitus with hyperglycemia, with long-term current use of insulin (HCC)   Essential hypertension   History of right below knee amputation (HCC)   Rhinovirus infection   Stage 5 chronic kidney disease (HCC)   Tobacco dependence  Resolved Problems:   * No resolved hospital problems. *  Hospital Course: 51 y.o. male with medical history significant of DM, HTN, HLD, PAD s/p R BKA, stage 5 CKD, and neuropathy presenting with cough.   He was at his PCP and she checked pulse ox; O2 was low (87%) and told him to come to the [facility].  He does not feel SOB.  Cough, productive of yellow sputum.  No prior O2 requirement.  He has had low-grade fever.  +wheezing, rhinorrhea.  +tobacco, 1/2 ppd.       He is s/p R BKA.  He has 2 calluses on his left foot.  He reports that he was seeing wound care but they referred him to [facility] and he can only have the calluses cut down every 3 months.  Assessment and Plan:   Acute respiratory failure with hypoxia from Rhinovirus -Patient presented to his PCP yesterday for c/o cough, was found to be hypoxic to the 80s and sent to [facility] -His lowest documented O2 sat there was 90% -Further evaluation showed + test for rhinovirus -He is also a smoker -Symptoms are significantly improved -Ambulatory pulse ox this morning showed O2 sats 91% on room air -We will discharge on doxycycline 100 mg p.o. twice daily for 5 days, prednisone taper,  Mucinex 600 mg p.o. twice daily for 14 days  Stage 5 CKD -Appears to be stable at this time -He is followed by Dr. Royce Macadamia from nephrology as an outpatient -He appears to need outpatient referral for HD access -Continue home medications   PAD -s/p R BKA -Also has foot calluses on the L, needs close podiatry f/u -He last saw podiatry on 8/9 and is due for f/u in a couple of weeks (3 months) -Continue baclofen   DM -A1c is 7.2, indicating reasonable control -He has an insulin pump  -Continue gabapentin, metoclopramide   HTN -Continue diltiazem -Hold amlodipine - 2 CCBs are suboptimal   Mood d/o -His affect was quite flat at the time of my evaluation -Continue Abilify   GERD -Continue pantoprazole, sucralfate   Tobacco dependence -Encourage cessation.   -This was discussed with the patient and should be reviewed on an ongoing basis.   -Patch ordered  -He may have COPD contributing to his hypoxia; will do ambulatory pulse ox in the AM to see if he needs home O2.          Consultants:  Procedures performed:  Disposition: Home Diet recommendation:  Discharge Diet Orders (From admission, onward)     Start     Ordered   09/12/22 0000  Diet - low sodium heart healthy        09/12/22 1442  Carb modified diet DISCHARGE MEDICATION: Allergies as of 09/12/2022       Reactions   Iron Nausea And Vomiting   With the iron tablets   Tape Rash   Rash at site of tape--okay to use paper tape        Medication List     STOP taking these medications    amLODipine 5 MG tablet Commonly known as: NORVASC       TAKE these medications    AgaMatrix Ultra-Thin Lancets Misc Check blood sugars before meals twice daily   ARIPiprazole 20 MG tablet Commonly known as: ABILIFY Take 20 mg by mouth daily.   atorvastatin 40 MG tablet Commonly known as: LIPITOR Take 40 mg by mouth at bedtime.   baclofen 10 MG tablet Commonly known as: LIORESAL 1/2 tab  twice a day What changed:  how much to take how to take this when to take this additional instructions   calcitRIOL 0.5 MCG capsule Commonly known as: ROCALTROL Take 0.5 mcg by mouth daily. What changed: Another medication with the same name was removed. Continue taking this medication, and follow the directions you see here.   diltiazem 300 MG 24 hr capsule Commonly known as: CARDIZEM CD Take 1 capsule (300 mg total) by mouth at bedtime.   doxycycline 100 MG capsule Commonly known as: VIBRAMYCIN Take 1 capsule (100 mg total) by mouth 2 (two) times daily for 5 days.   Farxiga 10 MG Tabs tablet Generic drug: dapagliflozin propanediol Take 10 mg by mouth daily.   furosemide 40 MG tablet Commonly known as: LASIX Take 40 mg by mouth daily.   gabapentin 300 MG capsule Commonly known as: NEURONTIN 1 cap by mouth in morning and midday, 2 caps by mouth at bedtime What changed:  how much to take how to take this when to take this additional instructions   glucose blood test strip 1 each by Other route as needed for other. Use as instructed   glucose blood test strip Commonly known as: AgaMatrix Presto Test Check blood sugars twice daily before meals   guaiFENesin 600 MG 12 hr tablet Commonly known as: Mucinex Take 1 tablet (600 mg total) by mouth 2 (two) times daily for 14 days.   hydrALAZINE 50 MG tablet Commonly known as: APRESOLINE Take 50 mg by mouth in the morning and at bedtime.   insulin lispro 100 UNIT/ML injection Commonly known as: HUMALOG Use as directed via insulin pump. MAX TDD 30 units   Omnipod DASH Pods (Gen 4) Misc SMARTSIG:SUB-Q Every 3 Days   ondansetron 4 MG tablet Commonly known as: ZOFRAN Take 1 tablet (4 mg total) by mouth 3 (three) times daily as needed for nausea or vomiting.   oxyCODONE-acetaminophen 5-325 MG tablet Commonly known as: PERCOCET/ROXICET Take 1 tablet by mouth every 6 (six) hours as needed for up to 10 doses for severe  pain.   pantoprazole 40 MG tablet Commonly known as: Protonix Take 1 tablet (40 mg total) by mouth 2 (two) times daily.   predniSONE 10 MG tablet Commonly known as: DELTASONE Prednisone 40 mg po daily x 1 day then Prednisone 30 mg po daily x 1 day then Prednisone 20 mg po daily x 1 day then Prednisone 10 mg daily x 1 day then stop...   tamsulosin 0.4 MG Caps capsule Commonly known as: FLOMAX Take 0.4 mg by mouth as needed (misc. genitourinary products).        Discharge Exam: Filed Weights   09/10/22 1438  Weight: 112 kg   General-appears in no acute distress Heart-S1-S2, regular, no murmur auscultated Lungs-clear to auscultation bilaterally, no wheezing or crackles auscultated Abdomen-soft, nontender, no organomegaly Extremities-no edema in the lower extremities Neuro-alert, oriented x3, no focal deficit noted  Condition at discharge: good  The results of significant diagnostics from this hospitalization (including imaging, microbiology, ancillary and laboratory) are listed below for reference.   Imaging Studies: CT Chest Wo Contrast  Result Date: 09/10/2022 CLINICAL DATA:  Respiratory illness, nondiagnostic xray EXAM: CT CHEST WITHOUT CONTRAST TECHNIQUE: Multidetector CT imaging of the chest was performed following the standard protocol without IV contrast. RADIATION DOSE REDUCTION: This exam was performed according to the departmental dose-optimization program which includes automated exposure control, adjustment of the mA and/or kV according to patient size and/or use of iterative reconstruction technique. COMPARISON:  CT chest 06/25/2022, CT abdomen pelvis 08/18/2019, MRI chest 07/03/2022 FINDINGS: Cardiovascular: Normal heart size. No significant pericardial effusion. The thoracic aorta is normal in caliber. No atherosclerotic plaque of the thoracic aorta. No coronary artery calcifications. Mediastinum/Nodes: Persistent prominent but nonenlarged mediastinal lymph nodes. No  gross hilar adenopathy, noting limited sensitivity for the detection of hilar adenopathy on this noncontrast study. No enlarged mediastinal or axillary lymph nodes. Thyroid gland, trachea, and esophagus demonstrate no significant findings. Lungs/Pleura: Diffuse mild bronchial wall thickening. No focal consolidation. Stable 3 mm subpleural micronodule within the right upper lobe with a triangular morphology suggestive of an intrapulmonary lymph node. No new pulmonary nodule. No pulmonary mass. No pleural effusion. No pneumothorax. Upper Abdomen: Similar-appearing left adrenal gland hyperplasia with associated 1.3 cm fluid densely left adrenal gland nodule likely representing an adenoma. Musculoskeletal: No chest wall abnormality. Likely interval resection of left posterolateral lower soft tissue density. No suspicious lytic or blastic osseous lesions. No acute displaced fracture. Multilevel degenerative changes of the spine. IMPRESSION: 1. No acute intrathoracic abnormality. 2. Persistent prominent but nonenlarged mediastinal lymph nodes. Recommend attention on follow-up. Electronically Signed   By: Iven Finn M.D.   On: 09/10/2022 17:29   DG Chest Port 1 View  Result Date: 09/10/2022 CLINICAL DATA:  10031 Cough 786.2.ICD-9-CM. Productive cough. Nasal congestion. EXAM: PORTABLE CHEST 1 VIEW COMPARISON:  Chest x-ray 12/12/2021 FINDINGS: The heart and mediastinal contours are within normal limits. No focal consolidation. No pulmonary edema. No pleural effusion. No pneumothorax. No acute osseous abnormality. IMPRESSION: No active disease with limited evaluation due to patient positioning. Electronically Signed   By: Iven Finn M.D.   On: 09/10/2022 15:16    Microbiology: Results for orders placed or performed during the hospital encounter of 09/10/22  Resp Panel by RT-PCR (Flu A&B, Covid) Anterior Nasal Swab     Status: None   Collection Time: 09/10/22  2:43 PM   Specimen: Anterior Nasal Swab   Result Value Ref Range Status   SARS Coronavirus 2 by RT PCR NEGATIVE NEGATIVE Final    Comment: (NOTE) SARS-CoV-2 target nucleic acids are NOT DETECTED.  The SARS-CoV-2 RNA is generally detectable in upper respiratory specimens during the acute phase of infection. The lowest concentration of SARS-CoV-2 viral copies this assay can detect is 138 copies/mL. A negative result does not preclude SARS-Cov-2 infection and should not be used as the sole basis for treatment or other patient management decisions. A negative result may occur with  improper specimen collection/handling, submission of specimen other than nasopharyngeal swab, presence of viral mutation(s) within the areas targeted by this assay, and inadequate number of viral copies(<138 copies/mL). A negative result must be  combined with clinical observations, patient history, and epidemiological information. The expected result is Negative.  Fact Sheet for Patients:  EntrepreneurPulse.com.au  Fact Sheet for Healthcare Providers:  IncredibleEmployment.be  This test is no t yet approved or cleared by the [place] FDA and  has been authorized for detection and/or diagnosis of SARS-CoV-2 by FDA under an Emergency Use Authorization (EUA). This EUA will remain  in effect (meaning this test can be used) for the duration of the COVID-19 declaration under Section 564(b)(1) of the Act, 21 U.S.C.section 360bbb-3(b)(1), unless the authorization is terminated  or revoked sooner.       Influenza A by PCR NEGATIVE NEGATIVE Final   Influenza B by PCR NEGATIVE NEGATIVE Final    Comment: (NOTE) The Xpert Xpress SARS-CoV-2/FLU/RSV plus assay is intended as an aid in the diagnosis of influenza from Nasopharyngeal swab specimens and should not be used as a sole basis for treatment. Nasal washings and aspirates are unacceptable for Xpert Xpress SARS-CoV-2/FLU/RSV testing.  Fact Sheet for  Patients: EntrepreneurPulse.com.au  Fact Sheet for Healthcare Providers: IncredibleEmployment.be  This test is not yet approved or cleared by the [place] FDA and has been authorized for detection and/or diagnosis of SARS-CoV-2 by FDA under an Emergency Use Authorization (EUA). This EUA will remain in effect (meaning this test can be used) for the duration of the COVID-19 declaration under Section 564(b)(1) of the Act, 21 U.S.C. section 360bbb-3(b)(1), unless the authorization is terminated or revoked.  Performed at [facility], [address] [place], [place] [address]   Culture, blood (routine x 2)     Status: None (Preliminary result)   Collection Time: 09/10/22  4:53 PM   Specimen: Right Antecubital; Blood  Result Value Ref Range Status   Specimen Description   Final    RIGHT ANTECUBITAL BLOOD Performed at [facility], [place] [address] [place], [place] [address]    Special Requests   Final    BOTTLES DRAWN AEROBIC AND ANAEROBIC Blood Culture adequate volume Performed at [facility], [address] [place], [place] [address]    Culture   Final    NO GROWTH 2 DAYS Performed at [facility], [place] [address] [place], [place] [address]    Report Status PENDING  Incomplete  Culture, blood (routine x 2)     Status: None (Preliminary result)   Collection Time: 09/10/22  4:54 PM   Specimen: BLOOD RIGHT HAND  Result Value Ref Range Status   Specimen Description   Final    BLOOD RIGHT HAND Performed at [facility], [address] [place], [place] [address]    Special Requests   Final    BOTTLES DRAWN AEROBIC AND ANAEROBIC Blood Culture adequate volume Performed at [facility], [address] [place], [place] [address]    Culture   Final    NO GROWTH 2 DAYS Performed at [facility], [place] [address], [place], [place] [address]    Report Status PENDING  Incomplete   Respiratory (~20 pathogens) panel by PCR     Status: Abnormal   Collection Time: 09/10/22  7:46 PM   Specimen: Nasopharyngeal Swab; Respiratory  Result Value Ref Range Status   Adenovirus NOT DETECTED NOT DETECTED Final   Coronavirus 229E NOT DETECTED NOT DETECTED Final    Comment: (NOTE) The Coronavirus on the Respiratory Panel, DOES NOT test for the novel  Coronavirus (2019 nCoV)  Coronavirus HKU1 NOT DETECTED NOT DETECTED Final   Coronavirus NL63 NOT DETECTED NOT DETECTED Final   Coronavirus OC43 NOT DETECTED NOT DETECTED Final   Metapneumovirus NOT DETECTED NOT DETECTED Final   Rhinovirus / Enterovirus DETECTED (A) NOT DETECTED Final   Influenza A NOT DETECTED NOT DETECTED Final   Influenza B NOT DETECTED NOT DETECTED Final   Parainfluenza Virus 1 NOT DETECTED NOT DETECTED Final   Parainfluenza Virus 2 NOT DETECTED NOT DETECTED Final   Parainfluenza Virus 3 NOT DETECTED NOT DETECTED Final   Parainfluenza Virus 4 NOT DETECTED NOT DETECTED Final   Respiratory Syncytial Virus NOT DETECTED NOT DETECTED Final   Bordetella pertussis NOT DETECTED NOT DETECTED Final   Bordetella Parapertussis NOT DETECTED NOT DETECTED Final   Chlamydophila pneumoniae NOT DETECTED NOT DETECTED Final   Mycoplasma pneumoniae NOT DETECTED NOT DETECTED Final    Comment: Performed at [facility], [place] [address], [place], [place] [address]    Labs: CBC: Recent Labs  Lab 09/10/22 1451 09/12/22 0444  WBC 7.8 10.2  HGB 9.2* 8.8*  HCT 30.0* 27.8*  MCV 91.2 89.7  PLT 188 275   Basic Metabolic Panel: Recent Labs  Lab 09/10/22 1451 09/12/22 0444  NA 141 143  K 4.2 4.9  CL 112* 111  CO2 21* 22  GLUCOSE 145* 176*  BUN 70* 77*  CREATININE 5.99* 5.52*  CALCIUM 7.8* 8.2*   Liver Function Tests: No results for input(s): "AST", "ALT", "ALKPHOS", "BILITOT", "PROT", "ALBUMIN" in the last 168 hours. CBG: Recent Labs  Lab 09/11/22 1208 09/11/22 1656 09/11/22 2104 09/12/22 0821  09/12/22 1250  GLUCAP 252* 110* 195* 172* 137*    Discharge time spent: greater than 30 minutes.  Signed: Oswald Hillock, MD [facility] 09/12/2022

## 2022-09-12 NOTE — Discharge Instructions (Signed)
He will need repeat CT chest without contrast in 6 months to check for resolution of lymph nodes seen on CT chest

## 2022-09-12 NOTE — Plan of Care (Signed)
  Problem: Education: Goal: Knowledge of General Education information will improve Description: Including pain rating scale, medication(s)/side effects and non-pharmacologic comfort measures Outcome: Progressing   Problem: Health Behavior/Discharge Planning: Goal: Ability to manage health-related needs will improve Outcome: Progressing   Problem: Clinical Measurements: Goal: Ability to maintain clinical measurements within normal limits will improve Outcome: Progressing Goal: Will remain free from infection Outcome: Progressing Goal: Diagnostic test results will improve Outcome: Progressing Goal: Respiratory complications will improve Outcome: Progressing Goal: Cardiovascular complication will be avoided Outcome: Progressing   Problem: Activity: Goal: Risk for activity intolerance will decrease Outcome: Progressing   Problem: Nutrition: Goal: Adequate nutrition will be maintained Outcome: Progressing   Problem: Coping: Goal: Level of anxiety will decrease Outcome: Progressing   Problem: Elimination: Goal: Will not experience complications related to bowel motility Outcome: Progressing Goal: Will not experience complications related to urinary retention Outcome: Progressing   Problem: Pain Managment: Goal: General experience of comfort will improve Outcome: Progressing   Problem: Safety: Goal: Ability to remain free from injury will improve Outcome: Progressing   Problem: Education: Goal: Ability to describe self-care measures that may prevent or decrease complications (Diabetes Survival Skills Education) will improve Outcome: Progressing Goal: Individualized Educational Video(s) Outcome: Progressing   Problem: Coping: Goal: Ability to adjust to condition or change in health will improve Outcome: Progressing   Problem: Fluid Volume: Goal: Ability to maintain a balanced intake and output will improve Outcome: Progressing   Problem: Health Behavior/Discharge  Planning: Goal: Ability to identify and utilize available resources and services will improve Outcome: Progressing Goal: Ability to manage health-related needs will improve Outcome: Progressing   Problem: Metabolic: Goal: Ability to maintain appropriate glucose levels will improve Outcome: Progressing   Problem: Nutritional: Goal: Maintenance of adequate nutrition will improve Outcome: Progressing Goal: Progress toward achieving an optimal weight will improve Outcome: Progressing   Problem: Skin Integrity: Goal: Risk for impaired skin integrity will decrease Outcome: Progressing   Problem: Tissue Perfusion: Goal: Adequacy of tissue perfusion will improve Outcome: Progressing

## 2022-09-12 NOTE — Progress Notes (Signed)
SATURATION QUALIFICATIONS: (This note is used to comply with regulatory documentation for home oxygen)  Patient Saturations on Room Air at Rest = 95%  Patient Saturations on Room Air while Ambulating = 91%  Patient Saturations on N/A Liters of oxygen while Ambulating = N/A%  Please briefly explain why patient needs home oxygen: Pt has no O2 needs.

## 2022-09-12 NOTE — Progress Notes (Signed)
Pt received discharge paperwork and education. Pt and pt belongings ambulated off unit to be DC to home at this time, pt refused a wheelchair transport off unit.

## 2022-09-15 LAB — CULTURE, BLOOD (ROUTINE X 2)
Culture: NO GROWTH
Culture: NO GROWTH
Special Requests: ADEQUATE
Special Requests: ADEQUATE

## 2022-09-17 ENCOUNTER — Ambulatory Visit (HOSPITAL_COMMUNITY)
Admission: RE | Admit: 2022-09-17 | Discharge: 2022-09-17 | Disposition: A | Discharge status: Home / Self Care | Payer: Medicare Other | Source: Ambulatory Visit | Attending: Nephrology | Admitting: Nephrology

## 2022-09-17 VITALS — BP 153/76 | HR 97

## 2022-09-17 DIAGNOSIS — N179 Acute kidney failure, unspecified: Secondary | ICD-10-CM | POA: Insufficient documentation

## 2022-09-17 DIAGNOSIS — D62 Acute posthemorrhagic anemia: Secondary | ICD-10-CM | POA: Insufficient documentation

## 2022-09-17 DIAGNOSIS — D649 Anemia, unspecified: Secondary | ICD-10-CM | POA: Diagnosis present

## 2022-09-17 MED ORDER — EPOETIN ALFA-EPBX 10000 UNIT/ML IJ SOLN
10000.0000 [IU] | INTRAMUSCULAR | Status: DC
  Administered 2022-09-17: 10000 [IU] via SUBCUTANEOUS

## 2022-09-17 MED ORDER — EPOETIN ALFA-EPBX 10000 UNIT/ML IJ SOLN
INTRAMUSCULAR | Status: AC
  Filled 2022-09-17: qty 1

## 2022-09-18 LAB — POCT HEMOGLOBIN-HEMACUE: Hemoglobin: 10.5 g/dL — ABNORMAL LOW (ref 13.0–17.0)

## 2022-10-01 ENCOUNTER — Encounter (HOSPITAL_COMMUNITY)
Admission: RE | Admit: 2022-10-01 | Discharge: 2022-10-01 | Disposition: A | Discharge status: Home / Self Care | Payer: Medicare Other | Source: Ambulatory Visit | Attending: Nephrology | Admitting: Nephrology

## 2022-10-01 VITALS — BP 160/91 | HR 107 | Temp 97.1°F | Resp 16

## 2022-10-01 DIAGNOSIS — R609 Edema, unspecified: Secondary | ICD-10-CM | POA: Diagnosis present

## 2022-10-01 DIAGNOSIS — D649 Anemia, unspecified: Secondary | ICD-10-CM | POA: Insufficient documentation

## 2022-10-01 DIAGNOSIS — D62 Acute posthemorrhagic anemia: Secondary | ICD-10-CM | POA: Diagnosis present

## 2022-10-01 DIAGNOSIS — N179 Acute kidney failure, unspecified: Secondary | ICD-10-CM | POA: Insufficient documentation

## 2022-10-01 DIAGNOSIS — I471 Supraventricular tachycardia, unspecified: Secondary | ICD-10-CM | POA: Insufficient documentation

## 2022-10-01 LAB — IRON AND TIBC
Iron: 44 ug/dL — ABNORMAL LOW (ref 45–182)
Saturation Ratios: 18 % (ref 17.9–39.5)
TIBC: 248 ug/dL — ABNORMAL LOW (ref 250–450)
UIBC: 204 ug/dL

## 2022-10-01 LAB — POCT HEMOGLOBIN-HEMACUE: Hemoglobin: 8.4 g/dL — ABNORMAL LOW (ref 13.0–17.0)

## 2022-10-01 LAB — FERRITIN: Ferritin: 29 ng/mL (ref 24–336)

## 2022-10-01 MED ORDER — EPOETIN ALFA-EPBX 10000 UNIT/ML IJ SOLN
INTRAMUSCULAR | Status: AC
  Administered 2022-10-01: 10000 [IU] via SUBCUTANEOUS
  Filled 2022-10-01: qty 1

## 2022-10-01 MED ORDER — EPOETIN ALFA-EPBX 10000 UNIT/ML IJ SOLN: 10000.0000 [IU] | INTRAMUSCULAR | Status: DC

## 2022-10-06 NOTE — Progress Notes (Unsigned)
Cardiology Office Note Date:  10/07/2022  Patient ID:  [name], [name] 1971/01/12, MRN [id number] PCP:  [name], NP  Cardiologist:  Dr. Angelena Form Electrophysiologist: Dr. Curt Bears Nephrology: [facility]: Otho Bellows, FNP     Chief Complaint:  6 weeks  History of Present Illness: [name] [name] is a 51 y.o. 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  He was referred to Dr. Angelena Form April 2023 for palpitations, planned for monitoring and an echo  Monitor noted an SVT and recommended increasing his metoprolol and EP referral for possible ablation Turns out his PMD had already increased his metoprolol and no further changes made.  Echo noted preserved LVEF no ,  significant   He saw Dr. Curt Bears 05/19/22, aware of on/off fast rates, otherwise no particular symptoms reported.  He wanted to try and avoid procedures, started on diltiazem, stopped his metoprolol.  ER visit 06/25/22 for a cyst/swelling L chest wall, (reportedly drained some years prior). Creat worse, discussed with his nepgrologist, Dr. Royce Macadamia who has been seeing him regularly and likely pending need for HD, though if lytes were ok, no need to admit. CT chest noted mass/lesion, (not new noted on prior CTs) and radiology recommended MRI to better characterize. With some erythema given a course of antibiotics and discharged with close f/u with renal especially and PMD to discuss f/u on mass/MRI recs.  I saw him 06/30/22 His palpitations are less often/shorter but not gone. Mostly noted in the evenings in bed. They can last a couple minutes, he is aware of the fast rate but not particularly symptomatic otherwise. No CP No near syncope or syncope. No SOB While here his Dexcom alerts to BS of 67 and declining but feeling OK. He was given a cola and graham crackers with an upwards trend after  a couple minutes, still feeling well He follows closely with endo, has a insulin pump, infrequently with low readings His diltiazem was changed to PM dosing given palpitations seemed most often to bother him in the evenings.  Diltiazem was ultimately increased to '300mg'$  with ongoing palpitations  Admitted 09/10/22 for acute hypoxic respiratory failure, found with Rhinovirus Given Abx, steroid taper Discharged 09/12/22  LABS 09/12/22 K+ 4.0 BUN/Creat 77/5.52 (baseline looks 3-4's > 5) WBC 10.2 H/H 8.4/27  Baseline Hgb looks about  9-10) Plts 192  TODAY He is doing better. Initially denies any ongoing palpitations, though towards the end of our visit, again mentions palpitations/fast rates when in bed at night, lasting several minutes with a sense of racing beats.   No other associated symptoms No CP, denies SOB No dizzy spells, near syncope or syncope.  He reports some edema since his hospital stay, doesn't think the Flomax works well for him.  C/w + urine OP. He reports that he takes lasix BID (not daily), has been for several months at the direction of Dr. Royce Macadamia  He follows with dr. Royce Macadamia, see her in December, there have been discussions he says about dialysis, though not a final decision/recommendation yet.   Past Medical History:  Diagnosis Date   Allergy    seasonal and environmental   Anemia    ARF (acute renal failure) ([place]) 09/2017   Chronic constipation 05/13/2020   CKD (chronic kidney disease) stage 4, GFR 15-29 ml/min (HCC)    Depression    Diabetes mellitus without complication ([place]) 7494   GERD (gastroesophageal reflux disease)  Hyperlipidemia    Hypertension    Necrotizing fasciitis ([place]) 10/13/2017   Neuromuscular disorder (HCC)    neuropathy   PAD (peripheral artery disease) ([place])    Secondary hyperparathyroidism ([place])    Wound dehiscence 11/24/2017    Past Surgical History:  Procedure Laterality Date   AMPUTATION Right 10/13/2017   Procedure:  RIGHT BELOW KNEE AMPUTATION;  Surgeon: [name], MD;  Location: [facility];  Service: Orthopedics;  Laterality: Right;   AMPUTATION Right 11/24/2017   Procedure: AMPUTATION BELOW KNEE REVISION;  Surgeon: [name], MD;  Location: [facility];  Service: Orthopedics;  Laterality: Right;   BIOPSY  09/02/2021   Procedure: BIOPSY;  Surgeon: Sharyn Creamer, MD;  Location: [facility] ENDOSCOPY;  Service: Gastroenterology;;   COLONOSCOPY  05/11/2019   ESOPHAGOGASTRODUODENOSCOPY (EGD) WITH PROPOFOL N/A 09/02/2021   Procedure: ESOPHAGOGASTRODUODENOSCOPY (EGD) WITH PROPOFOL;  Surgeon: Sharyn Creamer, MD;  Location: [facility];  Service: Gastroenterology;  Laterality: N/A;   NO PAST SURGERIES     POLYPECTOMY     WISDOM TOOTH EXTRACTION      Current Outpatient Medications  Medication Sig Dispense Refill   AGAMATRIX ULTRA-THIN LANCETS MISC Check blood sugars before meals twice daily 100 each 11   ARIPiprazole (ABILIFY) 20 MG tablet Take 20 mg by mouth daily.     atorvastatin (LIPITOR) 40 MG tablet Take 40 mg by mouth at bedtime.     baclofen (LIORESAL) 10 MG tablet 1/2 tab twice a day (Patient taking differently: Take 10 mg by mouth in the morning and at bedtime. Muscle spasms) 30 each 6   calcitRIOL (ROCALTROL) 0.5 MCG capsule Take 0.5 mcg by mouth daily.     diltiazem (CARDIZEM CD) 300 MG 24 hr capsule Take 1 capsule (300 mg total) by mouth at bedtime. 30 capsule 2   FARXIGA 10 MG TABS tablet Take 10 mg by mouth daily.     furosemide (LASIX) 40 MG tablet Take 40 mg by mouth daily.     gabapentin (NEURONTIN) 300 MG capsule Take 300 mg by mouth 2 (two) times daily. Pt takes 300 mg 1 tablet in the morning, and 300 mg 1 tablet in the afternoon.     glucose blood (AGAMATRIX PRESTO TEST) test strip Check blood sugars twice daily before meals 100 each 12   glucose blood test strip 1 each by Other route as needed for other. Use as instructed     hydrALAZINE (APRESOLINE) 50 MG tablet Take 50 mg by mouth in the morning  and at bedtime.     Insulin Disposable Pump (OMNIPOD DASH PODS, GEN 4,) MISC SMARTSIG:SUB-Q Every 3 Days     insulin lispro (HUMALOG) 100 UNIT/ML injection Use as directed via insulin pump. MAX TDD 30 units     ondansetron (ZOFRAN) 4 MG tablet Take 1 tablet (4 mg total) by mouth 3 (three) times daily as needed for nausea or vomiting. 30 tablet 0   pantoprazole (PROTONIX) 40 MG tablet Take 1 tablet (40 mg total) by mouth 2 (two) times daily. 60 tablet 2   tamsulosin (FLOMAX) 0.4 MG CAPS capsule Take 0.4 mg by mouth as needed (misc. genitourinary products).     No current facility-administered medications for this visit.    Allergies:   Iron and Tape   Social History:  The patient  reports that he has been smoking cigarettes. He has a 17.50 pack-year smoking history. He has never used smokeless tobacco. He reports that he does not drink alcohol and does not  use drugs.   Family History:  The patient's family history includes Colon polyps in his mother; Depression in his mother; Diabetes Mellitus II in his mother; Heart disease in his father.  ROS:  Please see the history of present illness.    All other systems are reviewed and otherwise negative.   PHYSICAL EXAM:  VS:  BP 138/86   Pulse 100   Ht '6\' 5"'$  (1.956 m)   Wt 248 lb 12.8 oz (112.9 kg)   SpO2 98%   BMI 29.50 kg/m  BMI: Body mass index is 29.5 kg/m. Well nourished, well developed, in no acute distress HEENT: normocephalic, atraumatic Neck: no JVD, carotid bruits or masses Cardiac:  RRR; no significant murmurs, no rubs, or gallops Lungs:  CTA b/l, no wheezing, rhonchi or rales Abd: soft, nontender MS: RLE prosthesis BOF:BPZWC-5+ edema LLE to below mid shin, chronic/scaling looking skin changes Skin: warm and dry, no rash Neuro:  No gross deficits appreciated Psych: euthymic mood, full affect    EKG:  done today and reviewed by myself ST 101bpm, no acute looking changes  May 2023: monitor Sinus rhythm. Minimum heart  rate of 59 bpm. Maximum heart rate of 171 bpm.  1 run of Ventricular Tachycardia occurred lasting 5 beats  47 Supraventricular Tachycardia  runs occurred, the longest lasting 2 mins 8 seconds. Supraventricular Tachycardia was detected within +/- 45 seconds of symptomatic patient event(s). Rare Premature atrial contractions (<1%) Rare premature ventricular contractions. (<1.0%, 1357)  03/31/2022: TTE 1. Left ventricular ejection fraction, by estimation, is 60 to 65%. The  left ventricle has normal function. The left ventricle has no regional  wall motion abnormalities. There is mild left ventricular hypertrophy.  Left ventricular diastolic parameters  were normal.   2. Right ventricular systolic function is normal. The right ventricular  size is normal. Tricuspid regurgitation signal is inadequate for assessing  PA pressure.   3. The mitral valve is normal in structure. No evidence of mitral valve  regurgitation. No evidence of mitral stenosis.   4. The aortic valve has an indeterminant number of cusps. Aortic valve  regurgitation is not visualized. No aortic stenosis is present.   5. The inferior vena cava is normal in size with greater than 50%  respiratory variability, suggesting right atrial pressure of 3 mmHg.   Comparison(s): No prior Echocardiogram.    Recent Labs: 09/10/2022: B Natriuretic Peptide 33.0 09/12/2022: BUN 77; Creatinine, Ser 5.52; Platelets 192; Potassium 4.9; Sodium 143 10/01/2022: Hemoglobin 8.4  No results found for requested labs within last 365 days.   CrCl cannot be calculated (Patient's most recent lab result is older than the maximum 21 days allowed.).   Wt Readings from Last 3 Encounters:  10/07/22 248 lb 12.8 oz (112.9 kg)  09/10/22 247 lb (112 kg)  06/30/22 240 lb (108.9 kg)     Other studies reviewed: Additional studies/records reviewed today include: summarized above  ASSESSMENT AND PLAN:  SVT Improved, not resolved With mostly nighttime  palpitations, still taking the dilt in AM Advised he try taking it in the evening HR settled to 90's  2. Edema I have advised him to reach out to Dr. Royce Macadamia   Disposition: back in 65mo sooner if needed.  Current medicines are reviewed at length with the patient today.  The patient did not have any concerns regarding medicines.  SVenetia Night PA-C 10/07/2022 12:41 PM     [place] [phone](office)  [phone]  [phone] (fax)

## 2022-10-07 ENCOUNTER — Encounter: Payer: Self-pay | Admitting: Podiatry

## 2022-10-07 ENCOUNTER — Ambulatory Visit (INDEPENDENT_AMBULATORY_CARE_PROVIDER_SITE_OTHER): Payer: Medicare Other | Admitting: Podiatry

## 2022-10-07 ENCOUNTER — Encounter (INDEPENDENT_AMBULATORY_CARE_PROVIDER_SITE_OTHER): Payer: Medicare Other | Admitting: Physician Assistant

## 2022-10-07 ENCOUNTER — Encounter: Payer: Self-pay | Admitting: Physician Assistant

## 2022-10-07 VITALS — BP 138/86 | HR 100 | Ht 77.0 in | Wt 248.8 lb

## 2022-10-07 DIAGNOSIS — M79675 Pain in left toe(s): Secondary | ICD-10-CM

## 2022-10-07 DIAGNOSIS — R609 Edema, unspecified: Secondary | ICD-10-CM

## 2022-10-07 DIAGNOSIS — B351 Tinea unguium: Secondary | ICD-10-CM | POA: Diagnosis not present

## 2022-10-07 DIAGNOSIS — L989 Disorder of the skin and subcutaneous tissue, unspecified: Secondary | ICD-10-CM

## 2022-10-07 DIAGNOSIS — I471 Supraventricular tachycardia, unspecified: Secondary | ICD-10-CM

## 2022-10-07 DIAGNOSIS — S88912S Complete traumatic amputation of left lower leg, level unspecified, sequela: Secondary | ICD-10-CM

## 2022-10-07 DIAGNOSIS — M898X9 Other specified disorders of bone, unspecified site: Secondary | ICD-10-CM

## 2022-10-07 DIAGNOSIS — E0843 Diabetes mellitus due to underlying condition with diabetic autonomic (poly)neuropathy: Secondary | ICD-10-CM

## 2022-10-07 NOTE — Progress Notes (Signed)
This patient returns to my office for at risk foot care.  This patient requires this care by a professional since this patient will be at risk due to having diabetes and CKD and amputation right leg. This patient is unable to cut nails himself since the patient cannot reach his nails.These nails are painful walking and wearing shoes. Patient also has a significant callus sub 1 left foot.  He says it was initially an ulcer but after treatment it developed into callus. This patient presents for at risk foot care today.  General Appearance  Alert, conversant and in no acute stress.  Vascular  Dorsalis pedis and posterior tibial  pulses are palpable  left.  .  Capillary return is within normal limits . Temperature is within normal limits  left  Neurologic  Senn-Weinstein monofilament wire test diminished  left   Muscle power within normal limits left   Nails Thick disfigured discolored nails with subungual debris  from hallux to fifth toes left . No evidence of bacterial infection or drainage bilaterally.  Orthopedic  No limitations of motion  feet .  No crepitus or effusions noted.  No bony pathology or digital deformities noted.  Skin  normotropic skin with no porokeratosis noted bilaterally.  No signs of infections or ulcers noted.  Callus sub 1 left foot.  Prominence sub 5th metabase left foot.   Onychomycosis  Pain in right toes  Pain in left toes  Callus left foot.  Consent was obtained for treatment procedures.   Mechanical debridement of nails 1-5  bilaterally performed with a nail nipper.  Filed with dremel without incident. Debride callus with dremel tool.  Patient is awaiting diabetic shoes.   Return office visit   10 weeks                  Told patient to return for periodic foot care and evaluation due to potential at risk complications.   Gardiner Barefoot DPM

## 2022-10-07 NOTE — Patient Instructions (Addendum)
Medication Instructions:   Your physician recommends that you continue on your current medications as directed. Please refer to the Current Medication list given to you today.  RECOMMENDED TO START TAKING DILTIAZEM IN THE EVENING  *If you need a refill on your cardiac medications before your next appointment, please call your pharmacy*   Lab Work:  If you have labs (blood work) drawn today and your tests are completely normal, you will receive your results only by: [place] (if you have MyChart) OR A paper copy in the mail If you have any lab test that is abnormal or we need to change your treatment, we will call you to review the results.   Testing/Procedures: NONE ORDERED  TODAY     Follow-Up: At [facility], you and your health needs are our priority.  As part of our continuing mission to provide you with exceptional heart care, we have created designated Provider Care Teams.  These Care Teams include your primary Cardiologist (physician) and Advanced Practice Providers (APPs -  Physician Assistants and Nurse Practitioners) who all work together to provide you with the care you need, when you need it.  We recommend signing up for the patient portal called "MyChart".  Sign up information is provided on this After Visit Summary.  MyChart is used to connect with patients for Virtual Visits (Telemedicine).  Patients are able to view lab/test results, encounter notes, upcoming appointments, etc.  Non-urgent messages can be sent to your provider as well.   To learn more about what you can do with MyChart, go to [email or web address].    Your next appointment:   4 month(s)  The format for your next appointment:   In Person  Tommye Standard, PA-C   Other Instructions     Important Information About Sugar

## 2022-10-19 ENCOUNTER — Encounter (HOSPITAL_COMMUNITY)
Admission: RE | Admit: 2022-10-19 | Discharge: 2022-10-19 | Disposition: A | Discharge status: Home / Self Care | Payer: Medicare Other | Source: Ambulatory Visit | Attending: Nephrology | Admitting: Nephrology

## 2022-10-19 ENCOUNTER — Other Ambulatory Visit: Payer: Self-pay | Admitting: *Deleted

## 2022-10-19 ENCOUNTER — Ambulatory Visit (HOSPITAL_COMMUNITY)
Admission: RE | Admit: 2022-10-19 | Discharge: 2022-10-19 | Disposition: A | Discharge status: Home / Self Care | Payer: Medicare Other | Source: Ambulatory Visit | Attending: Surgery | Admitting: Surgery

## 2022-10-19 ENCOUNTER — Ambulatory Visit (INDEPENDENT_AMBULATORY_CARE_PROVIDER_SITE_OTHER)
Admission: RE | Admit: 2022-10-19 | Discharge: 2022-10-19 | Disposition: A | Discharge status: Home / Self Care | Payer: Medicare Other | Source: Ambulatory Visit | Attending: Surgery | Admitting: Surgery

## 2022-10-19 VITALS — BP 131/83 | HR 90 | Temp 97.7°F | Resp 17

## 2022-10-19 DIAGNOSIS — D649 Anemia, unspecified: Secondary | ICD-10-CM

## 2022-10-19 DIAGNOSIS — N185 Chronic kidney disease, stage 5: Secondary | ICD-10-CM | POA: Insufficient documentation

## 2022-10-19 DIAGNOSIS — D62 Acute posthemorrhagic anemia: Secondary | ICD-10-CM

## 2022-10-19 DIAGNOSIS — N179 Acute kidney failure, unspecified: Secondary | ICD-10-CM

## 2022-10-19 LAB — POCT HEMOGLOBIN-HEMACUE: Hemoglobin: 9.7 g/dL — ABNORMAL LOW (ref 13.0–17.0)

## 2022-10-19 MED ORDER — EPOETIN ALFA-EPBX 10000 UNIT/ML IJ SOLN
10000.0000 [IU] | INTRAMUSCULAR | Status: DC
  Administered 2022-10-19: 10000 [IU] via SUBCUTANEOUS

## 2022-10-19 MED ORDER — EPOETIN ALFA-EPBX 10000 UNIT/ML IJ SOLN
INTRAMUSCULAR | Status: AC
  Filled 2022-10-19: qty 1

## 2022-10-20 ENCOUNTER — Encounter: Payer: Medicare Other | Admitting: Vascular Surgery

## 2022-10-21 ENCOUNTER — Other Ambulatory Visit: Payer: Self-pay | Admitting: Physician Assistant

## 2022-10-28 ENCOUNTER — Ambulatory Visit (INDEPENDENT_AMBULATORY_CARE_PROVIDER_SITE_OTHER): Payer: Medicare Other | Admitting: Podiatry

## 2022-10-28 ENCOUNTER — Telehealth: Payer: Self-pay | Admitting: Podiatry

## 2022-10-28 DIAGNOSIS — S88912S Complete traumatic amputation of left lower leg, level unspecified, sequela: Secondary | ICD-10-CM

## 2022-10-28 DIAGNOSIS — E0843 Diabetes mellitus due to underlying condition with diabetic autonomic (poly)neuropathy: Secondary | ICD-10-CM

## 2022-10-28 NOTE — Progress Notes (Signed)
Patient presents today to pick up custom molded foot orthotics recommended by Dr. Amalia Hailey.   Orthotics were dispensed and fit was satisfactory. Reviewed instructions for break-in and wear. Written instructions given to patient.  Patient will follow up as needed.   [facility] - order # [id number]

## 2022-10-28 NOTE — Telephone Encounter (Signed)
Left messsage on vm to call back to schedule appt to pick up orthotic.   Note sent to Dr Amalia Hailey to put in charges.

## 2022-11-02 ENCOUNTER — Encounter (HOSPITAL_COMMUNITY): Payer: Medicare Other

## 2022-11-05 ENCOUNTER — Encounter (HOSPITAL_COMMUNITY)
Admission: RE | Admit: 2022-11-05 | Discharge: 2022-11-05 | Disposition: A | Discharge status: Home / Self Care | Payer: Medicare Other | Source: Ambulatory Visit | Attending: Nephrology | Admitting: Nephrology

## 2022-11-05 VITALS — BP 151/85 | HR 99 | Temp 97.3°F | Resp 18

## 2022-11-05 DIAGNOSIS — N179 Acute kidney failure, unspecified: Secondary | ICD-10-CM | POA: Insufficient documentation

## 2022-11-05 DIAGNOSIS — D62 Acute posthemorrhagic anemia: Secondary | ICD-10-CM | POA: Insufficient documentation

## 2022-11-05 DIAGNOSIS — D649 Anemia, unspecified: Secondary | ICD-10-CM | POA: Diagnosis present

## 2022-11-05 LAB — IRON AND TIBC
Iron: 95 ug/dL (ref 45–182)
Saturation Ratios: 27 % (ref 17.9–39.5)
TIBC: 350 ug/dL (ref 250–450)
UIBC: 255 ug/dL

## 2022-11-05 LAB — POCT HEMOGLOBIN-HEMACUE
Hemoglobin: 9.4 g/dL — ABNORMAL LOW (ref 13.0–17.0)
Hemoglobin: 9.4 g/dL — ABNORMAL LOW (ref 13.0–17.0)

## 2022-11-05 LAB — FERRITIN: Ferritin: 27 ng/mL (ref 24–336)

## 2022-11-05 MED ORDER — EPOETIN ALFA-EPBX 10000 UNIT/ML IJ SOLN
INTRAMUSCULAR | Status: AC
  Administered 2022-11-05: 10000 [IU] via SUBCUTANEOUS
  Filled 2022-11-05: qty 1

## 2022-11-05 MED ORDER — EPOETIN ALFA-EPBX 10000 UNIT/ML IJ SOLN: 10000.0000 [IU] | INTRAMUSCULAR | Status: DC

## 2022-11-06 ENCOUNTER — Encounter: Payer: Self-pay | Admitting: Student

## 2022-11-06 ENCOUNTER — Ambulatory Visit (INDEPENDENT_AMBULATORY_CARE_PROVIDER_SITE_OTHER): Payer: Medicare Other | Admitting: Student

## 2022-11-06 VITALS — BP 124/80 | HR 99 | Temp 97.3°F | Ht 77.0 in | Wt 252.2 lb

## 2022-11-06 DIAGNOSIS — N185 Chronic kidney disease, stage 5: Secondary | ICD-10-CM

## 2022-11-06 DIAGNOSIS — R0981 Nasal congestion: Secondary | ICD-10-CM | POA: Diagnosis not present

## 2022-11-06 DIAGNOSIS — Z0000 Encounter for general adult medical examination without abnormal findings: Secondary | ICD-10-CM

## 2022-11-06 DIAGNOSIS — Z23 Encounter for immunization: Secondary | ICD-10-CM | POA: Diagnosis not present

## 2022-11-06 DIAGNOSIS — J309 Allergic rhinitis, unspecified: Secondary | ICD-10-CM

## 2022-11-06 DIAGNOSIS — F17200 Nicotine dependence, unspecified, uncomplicated: Secondary | ICD-10-CM

## 2022-11-06 MED ORDER — FLUTICASONE PROPIONATE 50 MCG/ACT NA SUSP: 2.0000 | Freq: Every day | NASAL | 6 refills | Status: DC

## 2022-11-06 MED ORDER — FARXIGA 10 MG PO TABS: 10.0000 mg | ORAL_TABLET | Freq: Every day | ORAL | 3 refills | Status: DC

## 2022-11-06 NOTE — Progress Notes (Signed)
New Patient Office Visit  Subjective    Patient ID: [name] [name], male    DOB: 1971-03-12  Age: 51 y.o. MRN: [id number]  CC:  Chief Complaint  Patient presents with   Establish Care   Runny nose in the morning-taking medicine loratadine every 48 hours due to renal function  Needs refill of farxiga   HPI [name] presents to establish care PMH-HTN, HLD, gastroparesis, CKD 4 never been on dialysis but looking to put fistula in-recently got upper extremity dopplers (followed by [facility]-next appintment on monday), PAD, 2017 broke fibula and after this side of foot had infection with "big black bubble" -thinks 2018 traumatic injury leading to osteomyelitis/nec fas? and right BKA, neuropathy, T2DM (followed by endocrine-been following every 3 months), SVT (followed by EP on 300 mg daily), polyps - due for repeat colonoscopy in June SH-1/2 ppd since 1993 cigarettes (not interested in quitting), no alcohol use, no drugs; watches TV no job currently; lives with mom and sister FH-updated heart disease, high cholesterol and mother diabetes throughout family Surgical-BKA right in 2018  Outpatient Encounter Medications as of 11/06/2022  Medication Sig   amLODipine (NORVASC) 5 MG tablet Take 5 mg by mouth daily.   ARIPiprazole (ABILIFY) 20 MG tablet Take 20 mg by mouth daily.   atorvastatin (LIPITOR) 40 MG tablet Take 40 mg by mouth at bedtime.   baclofen (LIORESAL) 10 MG tablet 1/2 tab twice a day (Patient taking differently: Take 10 mg by mouth in the morning and at bedtime. Muscle spasms)   calcitRIOL (ROCALTROL) 0.5 MCG capsule Take 0.5 mcg by mouth daily.   diltiazem (CARDIZEM CD) 300 MG 24 hr capsule TAKE 1 CAPSULE(300 MG) BY MOUTH AT BEDTIME   fluticasone (FLONASE) 50 MCG/ACT nasal spray Place 2 sprays into both nostrils daily.   furosemide (LASIX) 40 MG tablet Take 40 mg by mouth daily.   gabapentin (NEURONTIN) 300 MG capsule Take 300 mg by mouth 2 (two) times daily. Pt takes  300 mg 1 tablet in the morning, and 300 mg 1 tablet in the afternoon.   hydrALAZINE (APRESOLINE) 50 MG tablet Take 50 mg by mouth in the morning and at bedtime.   pantoprazole (PROTONIX) 40 MG tablet Take 1 tablet (40 mg total) by mouth 2 (two) times daily. (Patient taking differently: Take 40 mg by mouth daily.)   tamsulosin (FLOMAX) 0.4 MG CAPS capsule Take 0.4 mg by mouth as needed (misc. genitourinary products).   [DISCONTINUED] FARXIGA 10 MG TABS tablet Take 10 mg by mouth daily.   AGAMATRIX ULTRA-THIN LANCETS MISC Check blood sugars before meals twice daily   FARXIGA 10 MG TABS tablet Take 1 tablet (10 mg total) by mouth daily.   glucose blood (AGAMATRIX PRESTO TEST) test strip Check blood sugars twice daily before meals   glucose blood test strip 1 each by Other route as needed for other. Use as instructed   Insulin Disposable Pump (OMNIPOD DASH PODS, GEN 4,) MISC SMARTSIG:SUB-Q Every 3 Days   insulin lispro (HUMALOG) 100 UNIT/ML injection Use as directed via insulin pump. MAX TDD 30 units   ondansetron (ZOFRAN) 4 MG tablet Take 1 tablet (4 mg total) by mouth 3 (three) times daily as needed for nausea or vomiting.   No facility-administered encounter medications on file as of 11/06/2022.    Past Medical History:  Diagnosis Date   Allergy    seasonal and environmental   Anemia    ARF (acute renal failure) (HCC) 09/2017   Chronic  constipation 05/13/2020   CKD (chronic kidney disease) stage 4, GFR 15-29 ml/min (HCC)    Depression    Diabetes mellitus without complication (HCC) 2019   GERD (gastroesophageal reflux disease)    Hyperlipidemia    Hypertension    Necrotizing fasciitis (HCC) 10/13/2017   Neuromuscular disorder (HCC)    neuropathy   PAD (peripheral artery disease) (HCC)    Secondary hyperparathyroidism (HCC)    Wound dehiscence 11/24/2017    Past Surgical History:  Procedure Laterality Date   AMPUTATION Right 10/13/2017   Procedure: RIGHT BELOW KNEE AMPUTATION;   Surgeon: Nadara Mustard, MD;  Location: [facility] OR;  Service: Orthopedics;  Laterality: Right;   AMPUTATION Right 11/24/2017   Procedure: AMPUTATION BELOW KNEE REVISION;  Surgeon: Nadara Mustard, MD;  Location: [facility] OR;  Service: Orthopedics;  Laterality: Right;   BIOPSY  09/02/2021   Procedure: BIOPSY;  Surgeon: Imogene Burn, MD;  Location: [facility] ENDOSCOPY;  Service: Gastroenterology;;   COLONOSCOPY  05/11/2019   ESOPHAGOGASTRODUODENOSCOPY (EGD) WITH PROPOFOL N/A 09/02/2021   Procedure: ESOPHAGOGASTRODUODENOSCOPY (EGD) WITH PROPOFOL;  Surgeon: Imogene Burn, MD;  Location: [facility] ENDOSCOPY;  Service: Gastroenterology;  Laterality: N/A;   NO PAST SURGERIES     POLYPECTOMY     WISDOM TOOTH EXTRACTION      Family History  Problem Relation Age of Onset   Diabetes Mellitus II Mother    Depression Mother    Colon polyps Mother    Hypertension Mother    High Cholesterol Mother    Heart disease Father        CABG x 4.  04/2017   Diabetes Father    High Cholesterol Father    Hypertension Father    Heart disease Maternal Grandmother    Heart disease Maternal Grandfather    Colon cancer Neg Hx    Esophageal cancer Neg Hx    Liver cancer Neg Hx    Pancreatic cancer Neg Hx    Rectal cancer Neg Hx    Stomach cancer Neg Hx     Social History   Socioeconomic History   Marital status: Divorced    Spouse name: Not on file   Number of children: 1   Years of education: 12   Highest education level: Associate degree: occupational, Scientist, product/process development, or vocational program  Occupational History   Occupation: unemployed  Tobacco Use   Smoking status: Every Day    Packs/day: 0.50    Years: 35.00    Total pack years: 17.50    Types: Cigarettes   Smokeless tobacco: Never  Vaping Use   Vaping Use: Never used  Substance and Sexual Activity   Alcohol use: No   Drug use: No   Sexual activity: Not on file  Other Topics Concern   Not on file  Social History Narrative   Originally from  [place].   Is  living with his mother, who essentially supports him now.   Social Determinants of Health   Financial Resource Strain: Medium Risk (11/19/2017)   Overall Financial Resource Strain (CARDIA)    Difficulty of Paying Living Expenses: Somewhat hard  Food Insecurity: Not on file  Transportation Needs: Not on file  Physical Activity: Not on file  Stress: Not on file  Social Connections: Not on file  Intimate Partner Violence: Not on file   Objective    BP 124/80   Pulse 99   Temp (!) 97.3 F (36.3 C)   Ht 6\' 5"  (1.956 m)   Wt 252 lb  3.2 oz (114.4 kg)   SpO2 100%   BMI 29.91 kg/m   General: Nontoxic, NAD, awake, alert, responsive to questions Head: Normocephalic atraumatic, no neck masses or adenopathy, nasal congestion present CV: Regular rate and rhythm  Respiratory: Clear to ausculation bilaterally, no wheezes rales or crackles, chest rises symmetrically,  no increased work of breathing Abdomen: Soft, non-tender, non-distended, normoactive bowel sounds  Extremities: Right BKA, no lower extremity edema in left extremity, well-perfused  Neuro: No focal deficits Skin: No rashes or lesions visualized  Assessment & Plan:   Problem List Items Addressed This Visit       Respiratory   Allergic rhinitis    Patient with nasal congestion.  Currently on loratadine every 48 hours given renal function. -Prescribed Flonase 2 puffs daily        Genitourinary   Stage 5 chronic kidney disease (HCC) (Chronic)    Patient will likely be starting dialysis soon.  Recently got an upper extremity Doppler.  Per chart review looks like he had missed his appointment with vascular office.  I provided the number to contact that office to set up an appointment to discuss fistula access.        Other   Tobacco dependence (Chronic)    Smoking half a pack daily.  Not interested in quitting or cutting down currently.  I discussed the options of Chantix with him but not interested currently. -Reassess  at future visits      Healthcare maintenance    Discussed with patient vaccinations importance given patient's multiple comorbidities.  Patient amenable to getting Prevnar 20, flu vaccine and COVID-vaccine -Hep C at future visit -Due for colonoscopy next year given multiple polyps on last colonoscopy      Other Visit Diagnoses     Nasal congestion    -  Primary   Relevant Medications   fluticasone (FLONASE) 50 MCG/ACT nasal spray   Need for immunization against influenza       Relevant Orders   Flu Vaccine QUAD 63mo+IM (Fluarix, Fluzone & Alfiuria Quad PF) (Completed)   Encounter for immunization       Relevant Orders   Pfizer Fall 2023 Covid-19 Vaccine 67yrs and older (Completed)   Pneumococcal conjugate vaccine 20-valent (Prevnar 20) (Completed)       No follow-ups on file.   [name], MD

## 2022-11-06 NOTE — Patient Instructions (Addendum)
It was great to see you! Thank you for allowing me to participate in your care!   Our plans for today:  - Please contact vascular surgeon for more information  Cherre Robins, MD [facility]  Phone: [phone] - I have prescribed flonase for you to take - I recommend getting flu and covid vaccines as well as prevnar 20 if you do not have this yet  Take care and seek immediate care sooner if you develop any concerns.  [name], MD

## 2022-11-07 ENCOUNTER — Encounter: Payer: Self-pay | Admitting: Student

## 2022-11-07 DIAGNOSIS — Z0000 Encounter for general adult medical examination without abnormal findings: Secondary | ICD-10-CM | POA: Insufficient documentation

## 2022-11-07 DIAGNOSIS — J309 Allergic rhinitis, unspecified: Secondary | ICD-10-CM | POA: Insufficient documentation

## 2022-11-07 NOTE — Assessment & Plan Note (Signed)
>>  ASSESSMENT AND PLAN FOR CHRONIC KIDNEY DISEASE, STAGE 5 (HCC) WRITTEN ON 11/07/2022 10:06 PM BY JAGADISH, MAYURI, MD  Patient will likely be starting dialysis soon.  Recently got an upper extremity Doppler.  Per chart review looks like he had missed his appointment with vascular office.  I provided the number to contact that office to set up an appointment to discuss fistula access.

## 2022-11-07 NOTE — Assessment & Plan Note (Signed)
Smoking half a pack daily.  Not interested in quitting or cutting down currently.  I discussed the options of Chantix with him but not interested currently. -Reassess at future visits

## 2022-11-07 NOTE — Assessment & Plan Note (Signed)
Patient will likely be starting dialysis soon.  Recently got an upper extremity Doppler.  Per chart review looks like he had missed his appointment with vascular office.  I provided the number to contact that office to set up an appointment to discuss fistula access.

## 2022-11-07 NOTE — Assessment & Plan Note (Signed)
Patient with nasal congestion.  Currently on loratadine every 48 hours given renal function. -Prescribed Flonase 2 puffs daily

## 2022-11-07 NOTE — Assessment & Plan Note (Signed)
Discussed with patient vaccinations importance given patient's multiple comorbidities.  Patient amenable to getting Prevnar 20, flu vaccine and COVID-vaccine -Hep C at future visit -Due for colonoscopy next year given multiple polyps on last colonoscopy

## 2022-11-11 ENCOUNTER — Telehealth: Payer: Self-pay

## 2022-11-11 DIAGNOSIS — J309 Allergic rhinitis, unspecified: Secondary | ICD-10-CM

## 2022-11-11 MED ORDER — LORATADINE 10 MG PO TABS: 10.0000 mg | ORAL_TABLET | ORAL | 11 refills | Status: DC

## 2022-11-11 NOTE — Telephone Encounter (Signed)
Patient LVM on nurse line requesting a prescription refill.   Patient requests Loratadine '10mg'$ .   I do not see this on patients medication list.   Please advise.

## 2022-11-13 ENCOUNTER — Encounter: Payer: Self-pay | Admitting: Vascular Surgery

## 2022-11-13 ENCOUNTER — Ambulatory Visit (INDEPENDENT_AMBULATORY_CARE_PROVIDER_SITE_OTHER): Payer: Medicare Other | Admitting: Vascular Surgery

## 2022-11-13 VITALS — BP 130/76 | HR 94 | Temp 98.2°F | Resp 20 | Ht 77.0 in | Wt 252.0 lb

## 2022-11-13 DIAGNOSIS — N185 Chronic kidney disease, stage 5: Secondary | ICD-10-CM

## 2022-11-13 NOTE — Progress Notes (Signed)
Office Note     CC:  ES[facility] Requesting Provider:  [name], NP  HPI: [name] [name] is a Right handed 51 y.o. ([birth year]) male with kidney disease who presents at the request of [name], NP for permanent HD access. The patient has had no prior access procedures.   On exam today, [name] [name] was distant, and seemed rather depressed.  He stated he was scared regarding the need for dialysis and the surgery necessary for it.  He had several questions regarding fistula creation.  No prior vascular surgery   Past Medical History:  Diagnosis Date   Allergy    seasonal and environmental   Anemia    ARF (acute renal failure) ([place]) 09/2017   Chronic constipation 05/13/2020   CKD (chronic kidney disease) stage 4, GFR 15-29 ml/min ([facility])    Depression    Diabetes mellitus without complication ([place]) 3329   GERD (gastroesophageal reflux disease)    Hyperlipidemia    Hypertension    Necrotizing fasciitis ([place]) 10/13/2017   Neuromuscular disorder ([facility])    neuropathy   PAD (peripheral artery disease) ([place])    Secondary hyperparathyroidism ([place])    Wound dehiscence 11/24/2017    Past Surgical History:  Procedure Laterality Date   AMPUTATION Right 10/13/2017   Procedure: RIGHT BELOW KNEE AMPUTATION;  Surgeon: [name], MD;  Location: [place] [facility];  Service: Orthopedics;  Laterality: Right;   AMPUTATION Right 11/24/2017   Procedure: AMPUTATION BELOW KNEE REVISION;  Surgeon: [name], MD;  Location: [place];  Service: Orthopedics;  Laterality: Right;   BIOPSY  09/02/2021   Procedure: BIOPSY;  Surgeon: Sharyn Creamer, MD;  Location: [facility] ENDOSCOPY;  Service: Gastroenterology;;   COLONOSCOPY  05/11/2019   ESOPHAGOGASTRODUODENOSCOPY (EGD) WITH PROPOFOL N/A 09/02/2021   Procedure: ESOPHAGOGASTRODUODENOSCOPY (EGD) WITH PROPOFOL;  Surgeon: Sharyn Creamer, MD;  Location: [place];  Service: Gastroenterology;  Laterality: N/A;   NO PAST SURGERIES     POLYPECTOMY     WISDOM TOOTH  EXTRACTION      Social History   Socioeconomic History   Marital status: Divorced    Spouse name: Not on file   Number of children: 1   Years of education: 12   Highest education level: Associate degree: occupational, Hotel manager, or vocational program  Occupational History   Occupation: unemployed  Tobacco Use   Smoking status: Every Day    Packs/day: 0.50    Years: 35.00    Total pack years: 17.50    Types: Cigarettes   Smokeless tobacco: Never  Vaping Use   Vaping Use: Never used  Substance and Sexual Activity   Alcohol use: No   Drug use: No   Sexual activity: Not on file  Other Topics Concern   Not on file  Social History Narrative   Originally from  [place].   Is living with his mother, who essentially supports him now.   Social Determinants of Health   Financial Resource Strain: Medium Risk (11/19/2017)   Overall Financial Resource Strain (CARDIA)    Difficulty of Paying Living Expenses: Somewhat hard  Food Insecurity: Not on file  Transportation Needs: Not on file  Physical Activity: Not on file  Stress: Not on file  Social Connections: Not on file  Intimate Partner Violence: Not on file   Family History  Problem Relation Age of Onset   Diabetes Mellitus II Mother    Depression Mother    Colon polyps Mother    Hypertension Mother    High  Cholesterol Mother    Heart disease Father        CABG x 4.  04/2017   Diabetes Father    High Cholesterol Father    Hypertension Father    Heart disease Maternal Grandmother    Heart disease Maternal Grandfather    Colon cancer Neg Hx    Esophageal cancer Neg Hx    Liver cancer Neg Hx    Pancreatic cancer Neg Hx    Rectal cancer Neg Hx    Stomach cancer Neg Hx     Current Outpatient Medications  Medication Sig Dispense Refill   AGAMATRIX ULTRA-THIN LANCETS MISC Check blood sugars before meals twice daily 100 each 11   amLODipine (NORVASC) 5 MG tablet Take 5 mg by mouth daily.     ARIPiprazole (ABILIFY) 20  MG tablet Take 20 mg by mouth daily.     atorvastatin (LIPITOR) 40 MG tablet Take 40 mg by mouth at bedtime.     baclofen (LIORESAL) 10 MG tablet 1/2 tab twice a day (Patient taking differently: Take 10 mg by mouth in the morning and at bedtime. Muscle spasms) 30 each 6   calcitRIOL (ROCALTROL) 0.5 MCG capsule Take 0.5 mcg by mouth daily.     diltiazem (CARDIZEM CD) 300 MG 24 hr capsule TAKE 1 CAPSULE(300 MG) BY MOUTH AT BEDTIME 90 capsule 3   FARXIGA 10 MG TABS tablet Take 1 tablet (10 mg total) by mouth daily. 30 tablet 3   fluticasone (FLONASE) 50 MCG/ACT nasal spray Place 2 sprays into both nostrils daily. 16 g 6   furosemide (LASIX) 40 MG tablet Take 40 mg by mouth daily.     gabapentin (NEURONTIN) 300 MG capsule Take 300 mg by mouth 2 (two) times daily. Pt takes 300 mg 1 tablet in the morning, and 300 mg 1 tablet in the afternoon.     glucose blood (AGAMATRIX PRESTO TEST) test strip Check blood sugars twice daily before meals 100 each 12   glucose blood test strip 1 each by Other route as needed for other. Use as instructed     hydrALAZINE (APRESOLINE) 50 MG tablet Take 50 mg by mouth in the morning and at bedtime.     Insulin Disposable Pump (OMNIPOD DASH PODS, GEN 4,) MISC SMARTSIG:SUB-Q Every 3 Days     insulin lispro (HUMALOG) 100 UNIT/ML injection Use as directed via insulin pump. MAX TDD 30 units     loratadine (CLARITIN) 10 MG tablet Take 1 tablet (10 mg total) by mouth every other day. 30 tablet 11   ondansetron (ZOFRAN) 4 MG tablet Take 1 tablet (4 mg total) by mouth 3 (three) times daily as needed for nausea or vomiting. 30 tablet 0   pantoprazole (PROTONIX) 40 MG tablet Take 1 tablet (40 mg total) by mouth 2 (two) times daily. (Patient taking differently: Take 40 mg by mouth daily.) 60 tablet 2   tamsulosin (FLOMAX) 0.4 MG CAPS capsule Take 0.4 mg by mouth as needed (misc. genitourinary products).     No current facility-administered medications for this visit.    Allergies   Allergen Reactions   Iron Nausea And Vomiting    With the iron tablets   Tape Rash    Rash at site of tape--okay to use paper tape     REVIEW OF SYSTEMS:  '[X]'$  denotes positive finding, '[ ]'$  denotes negative finding Cardiac  Comments:  Chest pain or chest pressure:    Shortness of breath upon exertion:    Short  of breath when lying flat:    Irregular heart rhythm:        Vascular    Pain in calf, thigh, or hip brought on by ambulation:    Pain in feet at night that wakes you up from your sleep:     Blood clot in your veins:    Leg swelling:         Pulmonary    Oxygen at home:    Productive cough:     Wheezing:         Neurologic    Sudden weakness in arms or legs:     Sudden numbness in arms or legs:     Sudden onset of difficulty speaking or slurred speech:    Temporary loss of vision in one eye:     Problems with dizziness:         Gastrointestinal    Blood in stool:     Vomited blood:         Genitourinary    Burning when urinating:     Blood in urine:        Psychiatric    Major depression:         Hematologic    Bleeding problems:    Problems with blood clotting too easily:        Skin    Rashes or ulcers:        Constitutional    Fever or chills:      PHYSICAL EXAMINATION:  Vitals:   11/13/22 1331  BP: 130/76  Pulse: 94  Resp: 20  Temp: 98.2 F (36.8 C)  SpO2: 92%  Weight: 252 lb (114.3 kg)  Height: '6\' 5"'$  (1.956 m)    General:  WDWN in NAD; vital signs documented above Gait: Not observed HENT: WNL, normocephalic Pulmonary: normal non-labored breathing , without Rales, rhonchi,  wheezing Cardiac: regular HR Abdomen: soft, NT, no masses Skin: without rashes Vascular Exam/Pulses:  Right Left  Radial 2+ (normal) 2+ (normal)  Ulnar    Femoral                 Extremities: without ischemic changes, without Gangrene , without cellulitis; without open wounds;  Ichthyosis vulgaris Musculoskeletal: no muscle wasting or  atrophy  Neurologic: A&O X 3;  No focal weakness or paresthesias are detected Psychiatric:  The pt has Normal affect.   Non-Invasive Vascular Imaging:     +-----------------+------------------+----------+--------------+  Left Basilic       Diameter (cm)   Depth (cm)   Findings     +-----------------+------------------+----------+--------------+  Prox upper arm      0.33 / 0.27                branching     +-----------------+------------------+----------+--------------+  Mid upper arm           0.24                                 +-----------------+------------------+----------+--------------+  Dist upper arm   0.25 / 0.29 / 0.14            branching     +-----------------+------------------+----------+--------------+  Antecubital fossa       0.11                                 +-----------------+------------------+----------+--------------+  Prox forearm  not visualized  +-----------------+------------------+----------+--------------+     ASSESSMENT/PLAN:  NIKO PENSON is a 51 y.o. 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  Based on vein mapping and examination, [name] [name] has a marginal basilic vein in the left arm which may be sizable enough for AV fistula creation.  I expect this to dilate at the time of his upper arm block.  Should this not dilate, [name] [name] would require a left arm AV graft. I had an extensive discussion with this patient in regards to the nature of access surgery, including risk, benefits, and alternatives.   The patient is aware that the risks of access surgery include but are not limited to: bleeding, infection, steal syndrome, nerve damage, ischemic monomelic neuropathy, failure of access to mature, complications related to venous hypertension, and possible need for additional access procedures in the future. I discussed with the patient the  nature of the staged access procedure, specifically the need for a second operation to transpose the first stage fistula if it matures adequately.     [name] [name] was not comfortable pursuing surgery at this time, and asked for think about it more.  I asked him to call my office should any questions or concerns arises I am happy to help him with access creation.   Broadus John, MD [facility] [phone]

## 2022-11-19 ENCOUNTER — Encounter (HOSPITAL_COMMUNITY)
Admission: RE | Admit: 2022-11-19 | Discharge: 2022-11-19 | Disposition: A | Discharge status: Home / Self Care | Payer: Medicare Other | Source: Ambulatory Visit | Attending: Nephrology | Admitting: Nephrology

## 2022-11-19 VITALS — BP 131/81 | HR 100 | Temp 97.3°F | Resp 20

## 2022-11-19 DIAGNOSIS — N179 Acute kidney failure, unspecified: Secondary | ICD-10-CM | POA: Diagnosis not present

## 2022-11-19 DIAGNOSIS — D62 Acute posthemorrhagic anemia: Secondary | ICD-10-CM

## 2022-11-19 DIAGNOSIS — D649 Anemia, unspecified: Secondary | ICD-10-CM

## 2022-11-19 LAB — POCT HEMOGLOBIN-HEMACUE: Hemoglobin: 9.3 g/dL — ABNORMAL LOW (ref 13.0–17.0)

## 2022-11-19 MED ORDER — EPOETIN ALFA-EPBX 10000 UNIT/ML IJ SOLN
10000.0000 [IU] | INTRAMUSCULAR | Status: DC
  Administered 2022-11-19: 10000 [IU] via SUBCUTANEOUS

## 2022-11-19 MED ORDER — EPOETIN ALFA-EPBX 10000 UNIT/ML IJ SOLN
INTRAMUSCULAR | Status: AC
  Filled 2022-11-19: qty 1

## 2022-11-24 ENCOUNTER — Other Ambulatory Visit: Payer: Self-pay | Admitting: Student

## 2022-11-25 MED ORDER — BACLOFEN 10 MG PO TABS: ORAL_TABLET | ORAL | 6 refills | Status: DC

## 2022-11-25 NOTE — Telephone Encounter (Signed)
Patient calls nurse line regarding baclofen refill. He reports that he takes this medication regularly due to muscle spasms related to prosthetic leg.   Please advise.   Talbot Grumbling, RN

## 2022-11-25 NOTE — Addendum Note (Signed)
Addended by: Orvis Brill on: 11/25/2022 03:07 PM   Modules accepted: Orders

## 2022-11-30 ENCOUNTER — Encounter (HOSPITAL_COMMUNITY): Payer: Self-pay

## 2022-12-03 ENCOUNTER — Encounter (HOSPITAL_COMMUNITY)
Admission: RE | Admit: 2022-12-03 | Discharge: 2022-12-03 | Disposition: A | Discharge status: Home / Self Care | Payer: 59 | Source: Ambulatory Visit | Attending: Nephrology | Admitting: Nephrology

## 2022-12-03 VITALS — BP 148/79 | HR 93 | Temp 97.6°F | Resp 20

## 2022-12-03 DIAGNOSIS — D649 Anemia, unspecified: Secondary | ICD-10-CM | POA: Diagnosis present

## 2022-12-03 DIAGNOSIS — N179 Acute kidney failure, unspecified: Secondary | ICD-10-CM

## 2022-12-03 DIAGNOSIS — D62 Acute posthemorrhagic anemia: Secondary | ICD-10-CM

## 2022-12-03 LAB — POCT HEMOGLOBIN-HEMACUE: Hemoglobin: 10.2 g/dL — ABNORMAL LOW (ref 13.0–17.0)

## 2022-12-03 LAB — IRON AND TIBC
Iron: 66 ug/dL (ref 45–182)
Saturation Ratios: 20 % (ref 17.9–39.5)
TIBC: 328 ug/dL (ref 250–450)
UIBC: 262 ug/dL

## 2022-12-03 LAB — FERRITIN: Ferritin: 27 ng/mL (ref 24–336)

## 2022-12-03 MED ORDER — EPOETIN ALFA-EPBX 10000 UNIT/ML IJ SOLN
10000.0000 [IU] | INTRAMUSCULAR | Status: DC
  Administered 2022-12-03: 10000 [IU] via SUBCUTANEOUS

## 2022-12-03 MED ORDER — EPOETIN ALFA-EPBX 10000 UNIT/ML IJ SOLN
INTRAMUSCULAR | Status: AC
  Filled 2022-12-03: qty 1

## 2022-12-08 ENCOUNTER — Encounter (HOSPITAL_BASED_OUTPATIENT_CLINIC_OR_DEPARTMENT_OTHER): Payer: 59 | Attending: Internal Medicine | Admitting: General Surgery

## 2022-12-08 ENCOUNTER — Ambulatory Visit (HOSPITAL_COMMUNITY)
Admission: RE | Admit: 2022-12-08 | Discharge: 2022-12-08 | Disposition: A | Discharge status: Home / Self Care | Payer: 59 | Source: Ambulatory Visit | Attending: General Surgery | Admitting: General Surgery

## 2022-12-08 ENCOUNTER — Telehealth: Payer: Self-pay

## 2022-12-08 ENCOUNTER — Other Ambulatory Visit (HOSPITAL_COMMUNITY): Payer: Self-pay | Admitting: General Surgery

## 2022-12-08 DIAGNOSIS — E1140 Type 2 diabetes mellitus with diabetic neuropathy, unspecified: Secondary | ICD-10-CM | POA: Insufficient documentation

## 2022-12-08 DIAGNOSIS — N185 Chronic kidney disease, stage 5: Secondary | ICD-10-CM | POA: Insufficient documentation

## 2022-12-08 DIAGNOSIS — M869 Osteomyelitis, unspecified: Secondary | ICD-10-CM | POA: Diagnosis present

## 2022-12-08 DIAGNOSIS — I120 Hypertensive chronic kidney disease with stage 5 chronic kidney disease or end stage renal disease: Secondary | ICD-10-CM | POA: Insufficient documentation

## 2022-12-08 DIAGNOSIS — E785 Hyperlipidemia, unspecified: Secondary | ICD-10-CM | POA: Insufficient documentation

## 2022-12-08 DIAGNOSIS — E11621 Type 2 diabetes mellitus with foot ulcer: Secondary | ICD-10-CM | POA: Insufficient documentation

## 2022-12-08 DIAGNOSIS — E1169 Type 2 diabetes mellitus with other specified complication: Secondary | ICD-10-CM | POA: Diagnosis present

## 2022-12-08 DIAGNOSIS — E1122 Type 2 diabetes mellitus with diabetic chronic kidney disease: Secondary | ICD-10-CM | POA: Diagnosis not present

## 2022-12-08 DIAGNOSIS — Z89511 Acquired absence of right leg below knee: Secondary | ICD-10-CM | POA: Insufficient documentation

## 2022-12-08 DIAGNOSIS — L97509 Non-pressure chronic ulcer of other part of unspecified foot with unspecified severity: Secondary | ICD-10-CM | POA: Insufficient documentation

## 2022-12-08 DIAGNOSIS — E1151 Type 2 diabetes mellitus with diabetic peripheral angiopathy without gangrene: Secondary | ICD-10-CM | POA: Insufficient documentation

## 2022-12-08 DIAGNOSIS — L97526 Non-pressure chronic ulcer of other part of left foot with bone involvement without evidence of necrosis: Secondary | ICD-10-CM | POA: Insufficient documentation

## 2022-12-08 DIAGNOSIS — L02212 Cutaneous abscess of back [any part, except buttock]: Secondary | ICD-10-CM | POA: Diagnosis not present

## 2022-12-08 NOTE — Progress Notes (Signed)
ABAD, MANARD ([id number]) [id number] Page 1 of 15 Visit Report for 12/08/2022 Chief Complaint Document Details Patient Name: Date of Service: [name] [name] 12/08/2022 1:15 PM Medical Record Number: [id number] Patient Account Number: [id number] Date of Birth/Sex: Treating RN: 1971/08/30 (52 y.o. M) Primary Care Provider: Henrene Hawking DISH, MA YURI Other Clinician: Referring Provider: Treating Provider/Extender: Nyra Jabs GA DISH, MA YURI Weeks in Treatment: 0 Information Obtained from: Patient Chief Complaint patient is here for a review of the wound on his plantar left first metatarsal head and back of his right leg 06/22/2022; patient presents for review of callus on the plantar aspect of the left first met head 12/08/2022: callus left 1st met head, now with deep ulcer underlying Electronic Signature(s) Signed: 12/08/2022 2:47:21 PM By: Fredirick Maudlin MD FACS Entered By: Fredirick Maudlin on 12/08/2022 14:47:21 -------------------------------------------------------------------------------- Debridement Details Patient Name: Date of Service: [name] [name], [name] MIE [name]. 12/08/2022 1:15 PM Medical Record Number: [id number] Patient Account Number: [id number] Date of Birth/Sex: Treating RN: [birth year] (52 y.o. M) Primary Care Provider: Henrene Hawking DISH, MA YURI Other Clinician: Referring Provider: Treating Provider/Extender: Nyra Jabs GA DISH, MA YURI Weeks in Treatment: 0 Debridement Performed for Assessment: Wound #4 Left,Medial Foot Performed By: Physician Fredirick Maudlin, MD Debridement Type: Debridement Severity of Tissue Pre Debridement: Fat layer exposed Level of Consciousness (Pre-procedure): Awake and Alert Pre-procedure Verification/Time Out Yes - 14:10 Taken: Start Time: 14:13 Pain Control: Lidocaine 5% topical ointment T Area Debrided ([name] x W): otal 2 (cm) x 2 (cm) = 4 (cm) Tissue and other material debrided: Non-Viable,  Callus, Slough, Subcutaneous, Slough Level: Skin/Subcutaneous Tissue Debridement Description: Excisional Instrument: Blade, Curette Bleeding: Minimum Hemostasis Achieved: Pressure Procedural Pain: 0 Post Procedural Pain: 0 Response to Treatment: Procedure was tolerated well Level of Consciousness (Post- Awake and Alert procedure): Post Debridement Measurements of Total Wound Length: (cm) 2 Width: (cm) 2 Depth: (cm) 0.4 Volume: (cm) 1.257 Character of Wound/Ulcer Post Debridement: Improved [name] [name], [name] [name] ([id number]) [id number] Page 2 of 15 Severity of Tissue Post Debridement: Fat layer exposed Post Procedure Diagnosis Same as Pre-procedure Notes Scribed for Dr Celine Ahr by Sharyn Creamer, RN Electronic Signature(s) Signed: 12/08/2022 2:46:47 PM By: Fredirick Maudlin MD FACS Entered By: Fredirick Maudlin on 12/08/2022 14:46:47 -------------------------------------------------------------------------------- HPI Details Patient Name: Date of Service: [name] [name], [name] MIE [name]. 12/08/2022 1:15 PM Medical Record Number: [id number] Patient Account Number: [id number] Date of Birth/Sex: Treating RN: 1971-03-25 (52 y.o. M) Primary Care Provider: Henrene Hawking DISH, MA YURI Other Clinician: Referring Provider: Treating Provider/Extender: Nyra Jabs GA DISH, MA YURI Weeks in Treatment: 0 History of Present Illness HPI Description: 04/21/18 ADMISSION This is a 52 year old man who is a type II diabetic. In spite of fact his hemoglobin A1c is actually quite good 5.73 months ago he is at a really difficult time over the last 6-7 months. He developed a rapidly progressive infection in the right foot in November 2018 associated with osteomyelitis and necrotizing fasciitis. 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 The stump required and a revision on 11/24/17. The stump revision was advertised as being secondary to falls. I'm not sure the progressive history here however this  area is actually closed over. The patient tells me over the same timeframe he has had wounds on the plantar aspect of his left foot. In the ED saw Dr. Sharol Given in April of this year. 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 It is  really not clear that this patient is been dressing this with anything. He came in with the clinic without any specific dressing on the wound areas using his own tennis shoe. He tells me that he only ambulates of course to do a pivot transfer. He does not have his prosthesis for the right leg as of yet and he blames Medicaid for this. He does however use the foot to push himself along in his wheelchair at home. The patient has not had formal arterial studies. ABI in our clinic on the left was 1.13. Patient's past medical history includes type 2 diabetes, fracture of the right fibula. I see that he was treated for abscesses on his right buttock and chest in 2016. I did not look at the microbiology of this. 04/28/18; patient comes back in the clinic today with the wound pretty much the same as when he came in here last week. Small opening lots of undermining relatively. He tells [place] that nothing is really been on this for 3 days in spite of the fact that we gave him enough to dress this easily especially such a small wound. He says he lives with his mother, she is not capable of assisting with this he is changing the dressings himself. 05/05/18; much better-looking wound today. Smaller. There is some undermining medially however that's only perhaps 2 mm. No surrounding erythema. We've been using silver collagen 05/12/18; small wound on the first metatarsal head. No undermining no surrounding erythema. We've been using silver collagen he has home health coming out to change 05/20/18 the wound on the first metatarsal head looks better. Covered in surface debris/callus nonviable tissue. Required debridement but post debridement this looks  quite good. We've been using silver collagen. He has home health 05/27/18; first metatarsal head wound continues to improve. Just about completely closed. Still a lot of surface debridement callus. We've been using silver collagen 06/06/18; the first metatarsal wound is completely healed over. There is still a lot of callus. I gently removed some of this just to make sure that there was no open area and there is not. The patient mentions to me that his left leg has felt like "lead" for about a week READMISSION 08/16/2020 This is a 52 year old man with type 2 diabetes. He returns to clinic today with a 1 month history of a blister and callus over the first metatarsal head. This is in exactly the same area as when he was here in 2019. 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 He has standard running shoes on the left foot to match the area in his prosthesis. He has only been covering this area with a Band-Aid has not really been specifically dressing this. ABI in our clinic on the left was 1.16. This is essentially stable from the value in 2019 10/1 the patient has a linear wound over the left first metatarsal head. Again not a lot of callus thick skin around this that I removed with a #3 curette. This is not go deep to bone or muscle it does not look to be infected. 10/8 first metatarsal head. The wound looks like it is closing however this is going to be a difficult area to offload in the future. He has a size 15 shoe and he says his shoes are years old. The inserts in these current shoes are totally useless and I have advised him to replace these. He may need a new  pair of shoes and I have he got original prosthesis at [facility] [place] I have asked him to go back there. 11/9; the patient has not been here in more than a month. Not sure he was healed when he was here the last time. I told him he would have to get new shoes and certainly offload the  area over the left first metatarsal head. He has done nothing of the kind. He arrives back in clinic with the same old new balance sneakers. A very large separating callus over the first metatarsal head on the left 11/16; he has purchased new shoes and has at least 2 insoles like I asked. The wound is measuring much smaller. He is using silver alginate he changes the dressing himself 11/30; wound is measurably smaller in length of about a half a centimeter. This is generally very little depth. We have been using silver alginate. He changes the dressing himself every second day. [name] [name], [name] [name] ([id number]) [id number] Page 3 of 15 12/14; wound is about the same size. Significant undermining medially relative to the size of the wound. We have been using silver alginate. There is not a way to offload this area as he walks with a prosthesis on the right leg 12/28; wound is about the same size. Again he has undermining medially. I am using polymen on this 12/02/2020; the wound looks smaller. Still a senescent edge. We have been using polymen 1/24; the wound is come down a few millimeters. Still a senescent edge. I have been using polymen 2/8; the wound is come down slightly. Still with slight undermining from 12-6 o'clock I have been using polymen partially to get offloading on this area which is opposed by his prosthesis on the other leg. 2/22 quite open improvement he still has a comma shaped wound on the left first metatarsal head. Some depth. Using polymen 3/14; he still has the same problem of a comma shaped area over the left first metatarsal head. This seems to have had more depth today at 0.6 cm. We have been using polymen. The patient changes this himself every second day. I explored the idea of a total contact cast on the left leg however this is his driving foot. He was not enamored with the idea of not being able to drive and states that he does not have  anybody else to get him around 3/28; again he comes in with a smaller looking wound however there is depth and undermining requiring debridement. We have been using Hydrofera Blue, changed to silver collagen today. The patient is doing the dressing himself 4/11; patient presents today for follow-up of his left diabetic foot wound. He denies any issues in the past 2 weeks. He is currently using silver collagen every other day with dressing changes. He does not use diabetic shoes or special inserts to his sneakers. He does not use felt pads for offloading. 4/19; patient presents for his 1 week follow-up. He denies any issues. He reports using Hydrofera Blue and has not been using silver collagen for dressing changes. He reports minimal drainage to the wound. He overall feels well. 5/3; patient presents for 2-week follow-up. He has been using silver collagen without issues. He has no complaints today and states he overall feels well. 5/17; patient presents for 2-week follow-up. He states that the sleeve is rubbing up against the back of his right leg and causing a wound. He states this happened a week ago and has not been dressing  the wound. The plantar wound is stable and he has been using collagen every other day. 5/24; Patient was had a recent hospitalization for cellulitis of his right popliteal fossa. He was admitted and started on IV antibiotics. He does not recall the plantar wound dressed while inpatient. He feels a lot better today. He was discharged on 5/21 on oral antibiotics and has not picked this up until today. He reports using collagen to the plantar wound since discharge. He is keeping the right posterior knee wound covered. He is getting a new sleeve for his prostatic on 5/26. He currently denies signs of infection. 6/1; patient presents for 1 week follow-up. He is still on doxycycline. He has not been dressing the back of the right knee wound. He states he is receiving his new  prosthetic sleeve tomorrow. He states he is using silver alginate to the plantar foot wound. He currently denies signs of infection. He states he has not been offloading the left foot wound 6/7; patient presents for 1 week follow-up. He completes his last dose of doxycycline today. He finally received his prosthetic sleeve for the right side and he states it feels much better. He has been using antibiotic ointment to the back of that right knee wound. He has been using silver alginate to the plantar foot wound. He currently denies signs of infection. 6/21; patient presents for 2-week follow-up. He reports no issues with his prosthetic sleeve. He has been using antibiotic ointment to the back of the right knee. He has been using silver alginate to the plantar foot wound. He has no issues or complaints today. He denies signs of infection. 7/5; patient presents for 2-week follow-up. He reports no issues or complaints today. He continues to use antibiotic ointment to the right posterior knee wound and silver alginate to the left plantar foot wound. He denies signs of infection. 7/19; patient presents for 2-week follow-up. He continues to use antibiotic ointment to the right posterior knee and silver alginate to the left plantar foot wound. He has no issues or complaints today. He denies signs of infection. 8/4; patient presents for 2-week follow-up. He has no issues or complaints today. He denies signs of infection. 8/18; patient presents for 2-week follow-up. He has no issues or concerns today. He has been approved for Apligraf and he would like to have this placed today. He denies signs of infection and reports that his right posterior knee wound is closed. 8/25; patient presents for 1 week follow-up. He had no issues with his first Apligraf placement last week. He did however develop an abscess to the back of his right knee. He reports tenderness to this area. He denies systemic signs of  infection. 9/1; patient presents for 1 week follow-up. He has had no issues with his plantar foot wound. The Apligraf has stayed in place over the past week. A wound culture was done at last clinic visit and a new antibiotic was sent to the pharmacy. Patient has not received this yet. He reports some mild tenderness to the back of his right knee. He denies systemic signs of infection. 9/8; patient presents for 1 week follow-up. He states he took Keflex and had diarrhea as a side effect. He was able to complete the course. He reports continued tenderness to the right posterior knee wound. He has some drainage still. He denies increased warmth or erythema to the area. He denies fever/chills or nausea/vomiting. He has no issues or complaints today about his plantar foot  wound. 9/15; patient presents for follow-up. He reports improvement in pain to the back of his right leg. He does not report any drainage. He had an ultrasound completed on 9/9 that showed a superficial fluid collection concerning for abscess. We attempted to call patient with results with no answer. We did put an urgent referral to general surgery and he reports he has not received a call. He currently denies systemic signs of infection. He reports no issues to his left plantar foot wound. 9/27; patient presents for follow-up. He has no issues or complaints today. He has had no issues with the previous wound to the back of his right leg. He has not made an appointment to see general surgery. He had Apligraf placed at last clinic visit to the plantar foot wound. He currently denies signs of infection. 10/7; patient presents for follow-up. Upon entering the room there is vomit throughout the entire floor. Patient states he does not feel well. I recommended he go to urgent care. I recommended following up next week for wound care. 10/10; patient presents for follow-up. He came last week for his follow-up however he was ill and started  vomiting in the exam room. Today he states he feels better however he continues to have vomiting episodes. He states he attempted to go to urgent care/the ED but could not find a parking spot. He subsequently went home. He has no issues or complaints today regarding the foot wound. He never followed up with general surgery for the abscess identified on ultrasound to the back of his right knee. 10/20; patient presents for follow-up. He has no issues or complaints today. Overall he feels well today. 10/27; patient presents for follow-up. He has no issues or complaints today. 11/3; patient presents for follow-up. He has no issues or complaints today. He states he does not want to follow-up with orthopedics to reevaluate the abscess in the back of his right leg. He denies signs of infection. 11/15; patient presents for follow-up. He has no issues or complaints today. He had been using silver alginate to the left first met head wound. He denies signs of infection. 11/22; patient presents for follow-up. He had Affinity placed at last clinic visit. He has no issues or complaints today. [name] [name], [name] [name] ([id number]) [id number] Page 4 of 15 11/29; patient presents for follow-up. He is in good spirits today. He has no issues or complaints today. 12/6; patient presents for follow-up. He has no issues or complaints today. 12/13; patient presents for follow-up. He denies signs of infection. He has no issues or complaints today. 12/20; patient presents for follow-up. He has no issues or complaints today. He denies signs of infection. 12/29; patient presents for follow-up. He states that a few days ago he has had reaccumulation of an abscess behind the right knee. He denies increased warmth or erythema to the area. He denies fever/chills or drainage. 1/6; patient presents for follow-up. He states he went to [facility] emergency department to be evaluated for the abscess to his  right posterior knee. He was started on clindamycin. They were unable to drain the abscess. Patient states that eventually the abscess ruptured. He reports improvement in his symptoms. He has no issues or complaints about his left foot wound. 1/17; patient presents for follow-up. He has no issues or complaints today. 1/24; patient presents for follow-up. He has no issues or complaints today. He denies signs of infection. 1/31; patient presents for follow-up. He has been approved for Affinity however  has a 20% co-pay. Patient has declined further Affinity treatments due to this. He currently denies signs of infection. 2/7; patient presents for follow-up. He has no issues or complaints today. He denies signs of infection. He has been doing Hydrofera Blue dressing changes. 2/14; patient presents for follow-up. He has no issues or complaints today. He has been using blast X with collagen to the wound bed. He currently denies any issues. 2/28; patient presents for follow-up. He denies signs of infection. He reports using blast X with collagen to the wound bed. 3/7; patient presents for follow-up. He states he has been using gentamicin ointment and urea cream with dressing changes. He reports that the back of his right knee has developed an abscess again and is draining. He states this started today. 02/03/2022: The patient came to clinic today without any dressing on his foot. He says that he took a shower yesterday and felt like he did not need a dressing and so he did not apply it. He is not offloading this foot and is wearing a regular athletic shoe. 3/21; patient presents for follow-up. He did not come in with a dressing on his wound. He states he has been using gentamicin ointment daily. He has a Pegasys insert to his tennis shoe for offloading. He currently denies signs of infection. 3/28; patient presents for follow-up. He reports using gentamicin ointment to the wound bed. He has no issues or  complaints today. 5/30/2023consult Patient presents because he states he tried to remove the callus on his plantar left foot and thinks he created a wound. He has been keeping the area covered. He denies drainage. 06/22/2022 Patient presents for review of callus to the plantar left foot. He denies any drainage or open wounds to the area. READMISSION 12/08/2022 He returns with the same large callus on the first metatarsal head as has been present at prior visits. There is drainage and malodor emanating from the site, although no obvious tissue defect is initially appreciated. His most recent hemoglobin A1c was in October 2023 and was 7.2. His renal function has deteriorated to the point where he needs to be considering dialysis and actually had a consultation with vascular surgery for access creation, but he has not yet decided whether or not he wishes to go through with it. Electronic Signature(s) Signed: 12/08/2022 2:51:44 PM By: Fredirick Maudlin MD FACS Entered By: Fredirick Maudlin on 12/08/2022 14:51:44 -------------------------------------------------------------------------------- Physical Exam Details Patient Name: Date of Service: [name] [name], [name] MIE [name]. 12/08/2022 1:15 PM Medical Record Number: [id number] Patient Account Number: [id number] Date of Birth/Sex: Treating RN: 1971-06-04 (52 y.o. M) Primary Care Provider: Henrene Hawking DISH, MA YURI Other Clinician: Referring Provider: Treating Provider/Extender: Nyra Jabs GA DISH, MA YURI Weeks in Treatment: 0 Constitutional . . . . No acute distress. Respiratory Normal work of breathing on room air. Cardiovascular . MALEKO, GREULICH ([id number]) [id number] Page 5 of 15 Integumentary (Hair, Skin) Incredibly dry with a reptilian appearance. Notes 12/08/2022: There is a thick protruding callus on the plantar surface of the first metatarsal head. It has peeled away slightly from the skin surface.  After debridement of the callus, a fissure-like ulcer was identified. This does probe to bone. Electronic Signature(s) Signed: 12/08/2022 2:53:21 PM By: Fredirick Maudlin MD FACS Entered By: Fredirick Maudlin on 12/08/2022 14:53:21 -------------------------------------------------------------------------------- Physician Orders Details Patient Name: Date of Service: [name] [name], [name] MIE [name]. 12/08/2022 1:15 PM Medical Record Number: [id number] Patient Account Number: [id number] Date of Birth/Sex: Treating RN: 1971/11/10 (  52 y.o. 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 Anesthetic Wound #4 Left,Medial Foot (In clinic) Topical Lidocaine 5% applied to wound bed Bathing/ Shower/ Hygiene May shower with protection but do not get wound dressing(s) wet. Protect dressing(s) with water repellant cover (for example, large plastic bag) or a cast cover and may then take shower. Home Health Wound #4 Left,Medial Foot Admit to Home Health for skilled nursing wound care. May utilize formulary equivalent dressing for wound treatment orders unless otherwise specified. New wound care orders this week; continue Home Health for wound care. May utilize formulary equivalent dressing for wound treatment orders unless otherwise specified. Dressing changes to be completed by [place] on  Tuesday / Thursday / Saturday except when patient has scheduled visit at [facility]. Wound Treatment Wound #4 - Foot Wound Laterality: Left, Medial Cleanser: Wound Cleanser (DME) (Generic) 1 x Per Day/10 Days Discharge Instructions: Cleanse the wound with wound cleanser prior to applying a clean dressing using gauze sponges, not tissue or cotton balls. Prim Dressing: Hydrofera Blue Classic Foam Rope Dressing, 9x6 (mm/in) (DME) (Generic) 1 x Per Day/10 Days ary Discharge Instructions: Moisten with saline prior to packing Secondary Dressing: ABD Pad, 5x9 (DME) (Generic) 1 x Per Day/10 Days Discharge Instructions: Apply over primary dressing as directed. Secondary Dressing: Optifoam Non-Adhesive Dressing, 4x4 in (DME) (Generic) 1 x Per Day/10 Days Discharge Instructions: Apply over primary dressing as directed. [name] [name], [name] [name] ([id number]) [id number] Page 6 of 15 Secondary Dressing: Woven Gauze Sponge, Non-Sterile 4x4 in (DME) (Generic) 1 x Per Day/10 Days Discharge Instructions: Apply over primary dressing as directed. Secured With: [employer], 4.5x3.1 (in/yd) (DME) (Generic) 1 x Per Day/10 Days Discharge Instructions: Secure with Kerlix as directed. Secured With: 22M Medipore H Soft Cloth Surgical T ape, 4 x 10 (in/yd) (DME) (Generic) 1 x Per Day/10 Days Discharge Instructions: Secure with tape as directed. Laboratory naerobe culture (MICRO) - (ICD10 E11.621 - Type 2 diabetes mellitus with foot ulcer) Bacteria identified in Unspecified specimen by A LOINC Code: 878-6 Convenience Name: Anaerobic culture Radiology X-ray, foot - evaluation for osteomyelitis - (ICD10 E11.621 - Type 2 diabetes mellitus with foot ulcer) Patient Medications llergies: adhesive tape, Fer-Iron A Notifications Medication Indication Start End prior to debridement 12/08/2022 lidocaine DOSE topical 5 % ointment - ointment topical once daily Electronic  Signature(s) Signed: 12/08/2022 3:55:34 PM By: Fredirick Maudlin MD FACS Entered By: Fredirick Maudlin on 12/08/2022 14:53:42 Prescription 12/08/2022 -------------------------------------------------------------------------------- [name] [name], [name] [name] Fredirick Maudlin MD Patient Name: Provider: 1971/06/05 [id number] Date of Birth: NPI#: [name] [id number] Sex: DEA #: [phone] Phone #: License #: [place] Patient Address: [address] [place], [place] [address] [place], [place] [address] [phone] Allergies adhesive tape; Fer-Iron Provider's Orders naerobe culture - ICD10: E11.621 Bacteria identified in Unspecified specimen by A LOINC Code: 659-9 Convenience Name: Anaerobic culture Hand Signature: Date(s): Prescription 12/08/2022 [name] [name], [name] [name]. Fredirick Maudlin MD Patient  Name: Provider: 1971/06/05 [id number] Date of Birth: NPI#: [name] [id number] Sex: DEA #: [phone] Phone #: License #: [name] ([id number]) [id number] Page 7 of [place] Patient Address: [place] [address] [place], [place] [address] [place], [place] [address] [phone] Allergies adhesive tape; Fer-Iron Provider's Orders X-ray, foot - ICD10: [id number] - evaluation for osteomyelitis Hand Signature: Date(s): Electronic Signature(s) Signed: 12/08/2022 2:59:36 PM By: Fredirick Maudlin MD FACS Entered By: Fredirick Maudlin on 12/08/2022 14:59:36 -------------------------------------------------------------------------------- Problem List Details Patient Name: Date of Service: [name] [name], [name] MIE [name]. 12/08/2022 1:15 PM Medical Record Number: [id number] Patient Account Number: [id number] Date of Birth/Sex: Treating RN: [birth year] (52 y.o. 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 12/08/2022 No Yes without evidence of necrosis E11.621 Type 2 diabetes mellitus with foot ulcer 12/08/2022 No Yes N18.5 Chronic kidney disease, stage 5 12/08/2022 No Yes F17.200 Nicotine dependence, unspecified, uncomplicated 2/39/5320 No Yes E11.65 Type 2 diabetes mellitus with hyperglycemia 12/08/2022 No Yes Z89.511 Acquired absence of right leg below knee 12/08/2022 No Yes Inactive Problems Resolved Problems BARRON, VANLOAN [name] ([id number]) [id number] Page 8 of 15 Electronic Signature(s) Signed: 12/08/2022 2:46:24 PM By: Fredirick Maudlin MD FACS Entered By: Fredirick Maudlin on 12/08/2022 14:46:23 -------------------------------------------------------------------------------- Progress Note Details Patient Name: Date of Service: [name] [name], [name] MIE [name]. 12/08/2022 1:15 PM Medical Record Number: [id number] Patient Account Number: [id number] Date of Birth/Sex: Treating RN: [birth year] (52 y.o. M) Primary Care Provider: Henrene Hawking DISH, MA YURI Other Clinician: Referring Provider: Treating Provider/Extender: Nyra Jabs GA DISH, MA YURI Weeks in Treatment: 0 Subjective Chief Complaint Information obtained from Patient patient is here for a review of the wound on his plantar left first metatarsal head and back of his right leg 06/22/2022; patient presents for review of callus on the plantar aspect of the left first met head 12/08/2022: callus left 1st met head, now with deep ulcer underlying History of Present Illness (HPI) 04/21/18 ADMISSION This is a 52 year old man who is a type II diabetic. In spite of fact his hemoglobin A1c is actually quite good 5.73 months ago he is at a really  difficult time over the last 6-7 months. He developed a rapidly progressive infection in the right foot in November 2018 associated with osteomyelitis and necrotizing fasciitis. 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 The stump required and a revision on 11/24/17. The stump revision was advertised as being secondary to falls. I'm not sure the progressive history here however this area is actually closed over. The patient tells me over the same timeframe he has had wounds on the plantar aspect of his left foot. In the ED saw Dr. Sharol Given in April of this year. 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 It is really not clear that this patient is been dressing this with anything. He came in with the clinic without any specific dressing on the wound areas using his own tennis shoe. He tells me that he only ambulates of course to do a pivot transfer. He does not have his prosthesis for the right leg as of yet and he blames Medicaid for this. He does however use  the foot to push himself along in his wheelchair at home. The patient has not had formal arterial studies. ABI in our clinic on the left was 1.13. Patient's past medical history includes type 2 diabetes, fracture of the right fibula. I see that he was treated for abscesses on his right buttock and chest in 2016. I did not look at the microbiology of this. 04/28/18; patient comes back in the clinic today with the wound pretty much the same as when he came in here last week. Small opening lots of undermining relatively. He tells [place] that nothing is really been on this for 3 days in spite of the fact that we gave him enough to dress this easily especially such a small wound. He says he lives with his mother, she is not capable of assisting with this he is changing the dressings himself. 05/05/18; much better-looking wound today. Smaller. There is some undermining medially however that's only perhaps 2 mm. No  surrounding erythema. We've been using silver collagen 05/12/18; small wound on the first metatarsal head. No undermining no surrounding erythema. We've been using silver collagen he has home health coming out to change 05/20/18 the wound on the first metatarsal head looks better. Covered in surface debris/callus nonviable tissue. Required debridement but post debridement this looks quite good. We've been using silver collagen. He has home health 05/27/18; first metatarsal head wound continues to improve. Just about completely closed. Still a lot of surface debridement callus. We've been using silver collagen 06/06/18; the first metatarsal wound is completely healed over. There is still a lot of callus. I gently removed some of this just to make sure that there was no open area and there is not. The patient mentions to me that his left leg has felt like "lead" for about a week READMISSION 08/16/2020 This is a 52 year old man with type 2 diabetes. He returns to clinic today with a 1 month history of a blister and callus over the first metatarsal head. This is in exactly the same area as when he was here in 2019. 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 He has standard running shoes on the left foot to match the area in his prosthesis. He has only been covering this area with a Band-Aid has not really been specifically dressing this. ABI in our clinic on the left was 1.16. This is essentially stable from the value in 2019 10/1 the patient has a linear wound over the left first metatarsal head. Again not a lot of callus thick skin around this that I removed with a #3 curette. This is not go deep to bone or muscle it does not look to be infected. 10/8 first metatarsal head. The wound looks like it is closing however this is going to be a difficult area to offload in the future. He has a size 15 shoe and he says his shoes are years old. The inserts in these  current shoes are totally useless and I have advised him to replace these. He may need a new pair of shoes and I have he got original prosthesis at [facility] I have asked him to go back there. 11/9; the patient has not been here in more than a month. Not sure he was healed when he was here the last time. I told him he would have to get new shoes and certainly offload the area over the left first metatarsal  head. He has done nothing of the kind. He arrives back in clinic with the same old new balance sneakers. A very large separating callus over the first metatarsal head on the left 11/16; he has purchased new shoes and has at least 2 insoles like I asked. The wound is measuring much smaller. He is using silver alginate he changes the dressing himself 11/30; wound is measurably smaller in length of about a half a centimeter. This is generally very little depth. We have been using silver alginate. He changes the dressing himself every second day. 12/14; wound is about the same size. Significant undermining medially relative to the size of the wound. We have been using silver alginate. There is not a way to offload this area as he walks with a prosthesis on the right leg 12/28; wound is about the same size. Again he has undermining medially. I am using polymen on this 12/02/2020; the wound looks smaller. Still a senescent edge. We have been using polymen 1/24; the wound is come down a few millimeters. Still a senescent edge. I have been using polymen [name] [name], [name] [name] ([id number]) [phone].pdf Page 9 of 15 2/8; the wound is come down slightly. Still with slight undermining from 12-6 o'clock I have been using polymen partially to get offloading on this area which is opposed by his prosthesis on the other leg. 2/22 quite open improvement he still has a comma shaped wound on the left first metatarsal head. Some depth. Using polymen 3/14; he still has the same problem  of a comma shaped area over the left first metatarsal head. This seems to have had more depth today at 0.6 cm. We have been using polymen. The patient changes this himself every second day. I explored the idea of a total contact cast on the left leg however this is his driving foot. He was not enamored with the idea of not being able to drive and states that he does not have anybody else to get him around 3/28; again he comes in with a smaller looking wound however there is depth and undermining requiring debridement. We have been using Hydrofera Blue, changed to silver collagen today. The patient is doing the dressing himself 4/11; patient presents today for follow-up of his left diabetic foot wound. He denies any issues in the past 2 weeks. He is currently using silver collagen every other day with dressing changes. He does not use diabetic shoes or special inserts to his sneakers. He does not use felt pads for offloading. 4/19; patient presents for his 1 week follow-up. He denies any issues. He reports using Hydrofera Blue and has not been using silver collagen for dressing changes. He reports minimal drainage to the wound. He overall feels well. 5/3; patient presents for 2-week follow-up. He has been using silver collagen without issues. He has no complaints today and states he overall feels well. 5/17; patient presents for 2-week follow-up. He states that the sleeve is rubbing up against the back of his right leg and causing a wound. He states this happened a week ago and has not been dressing the wound. The plantar wound is stable and he has been using collagen every other day. 5/24; Patient was had a recent hospitalization for cellulitis of his right popliteal fossa. He was admitted and started on IV antibiotics. He does not recall the plantar wound dressed while inpatient. He feels a lot better today. He was discharged on 5/21 on oral antibiotics and has not picked this up  until today.  He reports using collagen to the plantar wound since discharge. He is keeping the right posterior knee wound covered. He is getting a new sleeve for his prostatic on 5/26. He currently denies signs of infection. 6/1; patient presents for 1 week follow-up. He is still on doxycycline. He has not been dressing the back of the right knee wound. He states he is receiving his new prosthetic sleeve tomorrow. He states he is using silver alginate to the plantar foot wound. He currently denies signs of infection. He states he has not been offloading the left foot wound 6/7; patient presents for 1 week follow-up. He completes his last dose of doxycycline today. He finally received his prosthetic sleeve for the right side and he states it feels much better. He has been using antibiotic ointment to the back of that right knee wound. He has been using silver alginate to the plantar foot wound. He currently denies signs of infection. 6/21; patient presents for 2-week follow-up. He reports no issues with his prosthetic sleeve. He has been using antibiotic ointment to the back of the right knee. He has been using silver alginate to the plantar foot wound. He has no issues or complaints today. He denies signs of infection. 7/5; patient presents for 2-week follow-up. He reports no issues or complaints today. He continues to use antibiotic ointment to the right posterior knee wound and silver alginate to the left plantar foot wound. He denies signs of infection. 7/19; patient presents for 2-week follow-up. He continues to use antibiotic ointment to the right posterior knee and silver alginate to the left plantar foot wound. He has no issues or complaints today. He denies signs of infection. 8/4; patient presents for 2-week follow-up. He has no issues or complaints today. He denies signs of infection. 8/18; patient presents for 2-week follow-up. He has no issues or concerns today. He has been approved for Apligraf and  he would like to have this placed today. He denies signs of infection and reports that his right posterior knee wound is closed. 8/25; patient presents for 1 week follow-up. He had no issues with his first Apligraf placement last week. He did however develop an abscess to the back of his right knee. He reports tenderness to this area. He denies systemic signs of infection. 9/1; patient presents for 1 week follow-up. He has had no issues with his plantar foot wound. The Apligraf has stayed in place over the past week. A wound culture was done at last clinic visit and a new antibiotic was sent to the pharmacy. Patient has not received this yet. He reports some mild tenderness to the back of his right knee. He denies systemic signs of infection. 9/8; patient presents for 1 week follow-up. He states he took Keflex and had diarrhea as a side effect. He was able to complete the course. He reports continued tenderness to the right posterior knee wound. He has some drainage still. He denies increased warmth or erythema to the area. He denies fever/chills or nausea/vomiting. He has no issues or complaints today about his plantar foot wound. 9/15; patient presents for follow-up. He reports improvement in pain to the back of his right leg. He does not report any drainage. He had an ultrasound completed on 9/9 that showed a superficial fluid collection concerning for abscess. We attempted to call patient with results with no answer. We did put an urgent referral to general surgery and he reports he has not received a call.  He currently denies systemic signs of infection. He reports no issues to his left plantar foot wound. 9/27; patient presents for follow-up. He has no issues or complaints today. He has had no issues with the previous wound to the back of his right leg. He has not made an appointment to see general surgery. He had Apligraf placed at last clinic visit to the plantar foot wound. He currently  denies signs of infection. 10/7; patient presents for follow-up. Upon entering the room there is vomit throughout the entire floor. Patient states he does not feel well. I recommended he go to urgent care. I recommended following up next week for wound care. 10/10; patient presents for follow-up. He came last week for his follow-up however he was ill and started vomiting in the exam room. Today he states he feels better however he continues to have vomiting episodes. He states he attempted to go to urgent care/the ED but could not find a parking spot. He subsequently went home. He has no issues or complaints today regarding the foot wound. He never followed up with general surgery for the abscess identified on ultrasound to the back of his right knee. 10/20; patient presents for follow-up. He has no issues or complaints today. Overall he feels well today. 10/27; patient presents for follow-up. He has no issues or complaints today. 11/3; patient presents for follow-up. He has no issues or complaints today. He states he does not want to follow-up with orthopedics to reevaluate the abscess in the back of his right leg. He denies signs of infection. 11/15; patient presents for follow-up. He has no issues or complaints today. He had been using silver alginate to the left first met head wound. He denies signs of infection. 11/22; patient presents for follow-up. He had Affinity placed at last clinic visit. He has no issues or complaints today. 11/29; patient presents for follow-up. He is in good spirits today. He has no issues or complaints today. 12/6; patient presents for follow-up. He has no issues or complaints today. KOREN, SERMERSHEIM ([id number]) [id number] Page 10 of 15 12/13; patient presents for follow-up. He denies signs of infection. He has no issues or complaints today. 12/20; patient presents for follow-up. He has no issues or complaints today. He denies signs of  infection. 12/29; patient presents for follow-up. He states that a few days ago he has had reaccumulation of an abscess behind the right knee. He denies increased warmth or erythema to the area. He denies fever/chills or drainage. 1/6; patient presents for follow-up. He states he went to the [facility] emergency department to be evaluated for the abscess to his right posterior knee. He was started on clindamycin. They were unable to drain the abscess. Patient states that eventually the abscess ruptured. He reports improvement in his symptoms. He has no issues or complaints about his left foot wound. 1/17; patient presents for follow-up. He has no issues or complaints today. 1/24; patient presents for follow-up. He has no issues or complaints today. He denies signs of infection. 1/31; patient presents for follow-up. He has been approved for Affinity however has a 20% co-pay. Patient has declined further Affinity treatments due to this. He currently denies signs of infection. 2/7; patient presents for follow-up. He has no issues or complaints today. He denies signs of infection. He has been doing Hydrofera Blue dressing changes. 2/14; patient presents for follow-up. He has no issues or complaints today. He has been using blast X with collagen to the wound bed.  He currently denies any issues. 2/28; patient presents for follow-up. He denies signs of infection. He reports using blast X with collagen to the wound bed. 3/7; patient presents for follow-up. He states he has been using gentamicin ointment and urea cream with dressing changes. He reports that the back of his right knee has developed an abscess again and is draining. He states this started today. 02/03/2022: The patient came to clinic today without any dressing on his foot. He says that he took a shower yesterday and felt like he did not need a dressing and so he did not apply it. He is not offloading this foot and is wearing a regular  athletic shoe. 3/21; patient presents for follow-up. He did not come in with a dressing on his wound. He states he has been using gentamicin ointment daily. He has a Pegasys insert to his tennis shoe for offloading. He currently denies signs of infection. 3/28; patient presents for follow-up. He reports using gentamicin ointment to the wound bed. He has no issues or complaints today. 04/21/2022ooconsult Patient presents because he states he tried to remove the callus on his plantar left foot and thinks he created a wound. He has been keeping the area covered. He denies drainage. 06/22/2022 Patient presents for review of callus to the plantar left foot. He denies any drainage or open wounds to the area. READMISSION 12/08/2022 He returns with the same large callus on the first metatarsal head as has been present at prior visits. There is drainage and malodor emanating from the site, although no obvious tissue defect is initially appreciated. His most recent hemoglobin A1c was in October 2023 and was 7.2. His renal function has deteriorated to the point where he needs to be considering dialysis and actually had a consultation with vascular surgery for access creation, but he has not yet decided whether or not he wishes to go through with it. Patient History Information obtained from Patient, Chart. Allergies adhesive tape (Severity: Mild, Reaction: rash), Fer-Iron Family History Diabetes - Maternal Grandparents,Mother, Heart Disease - Father, Hypertension - Father, No family history of Cancer, Hereditary Spherocytosis, Kidney Disease, Lung Disease, Seizures, Stroke, Thyroid Problems, Tuberculosis. Social History Current every day smoker, Marital Status - Single, Alcohol Use - Never, Drug Use - No History, Caffeine Use - Never. Medical History Eyes Denies history of Cataracts, Glaucoma, Optic Neuritis Ear/Nose/Mouth/Throat Denies history of Chronic sinus problems/congestion, Middle ear  problems Hematologic/Lymphatic Patient has history of Anemia Denies history of Hemophilia, Human Immunodeficiency Virus, Lymphedema, Sickle Cell Disease Respiratory Denies history of Aspiration, Asthma, Chronic Obstructive Pulmonary Disease (COPD), Pneumothorax, Sleep Apnea, Tuberculosis Cardiovascular Patient has history of Hypertension, Peripheral Arterial Disease, Peripheral Venous Disease Denies history of Angina, Arrhythmia, Congestive Heart Failure, Coronary Artery Disease, Deep Vein Thrombosis, Hypotension, Myocardial Infarction, Phlebitis, Vasculitis Gastrointestinal Denies history of Cirrhosis , Colitis, Crohnoos, Hepatitis A, Hepatitis B, Hepatitis C Endocrine Patient has history of Type II Diabetes - oral meds Denies history of Type I Diabetes Genitourinary Denies history of End Stage Renal Disease Immunological Denies history of Lupus Erythematosus, Raynaudoos, Scleroderma Integumentary (Skin) Denies history of History of Burn Musculoskeletal Denies history of Gout, Rheumatoid Arthritis, Osteoarthritis, Osteomyelitis [name] [name], [name] [name] ([id number]) [id number] Page 11 of 15 Neurologic Patient has history of Neuropathy Denies history of Dementia, Quadriplegia, Paraplegia, Seizure Disorder Oncologic Denies history of Received Chemotherapy, Received Radiation Psychiatric Denies history of Anorexia/bulimia, Confinement Anxiety Hospitalization/Surgery History - fever 105, sepsis cellulitis right popliteal fossa 04/09/2021. - 2018 necrotizing fasciitis. Medical A Surgical History  Notes [name] Constitutional Symptoms (General Health) falls , leukocytosis Respiratory During waking hours and catches himself not breathing, "gasp for air". Saw Dr Wynonia Lawman, he couldn't find a reason Cardiovascular hyperlipidemia Gastrointestinal GERD Genitourinary stage IV CKD Objective Constitutional No acute distress. Vitals Time Taken: 1:38 PM, Height: 77 in,  Source: Stated, Weight: 240 lbs, Source: Stated, BMI: 28.5, Temperature: 97.7 F, Pulse: 94 bpm, Respiratory Rate: 18 breaths/min, Blood Pressure: 124/84 mmHg, Capillary Blood Glucose: 228 mg/dl. Respiratory Normal work of breathing on room air. General Notes: 12/08/2022: There is a thick protruding callus on the plantar surface of the first metatarsal head. It has peeled away slightly from the skin surface. After debridement of the callus, a fissure-like ulcer was identified. This does probe to bone. Integumentary (Hair, Skin) Incredibly dry with a reptilian appearance. Wound #4 status is Open. Original cause of wound was Gradually Appeared. The date acquired was: 10/07/2022. The wound is located on the Left,Medial Foot. The wound measures 0.5cm length x 1.6cm width x 0.1cm depth; 0.628cm^2 area and 0.063cm^3 volume. There is Fat Layer (Subcutaneous Tissue) exposed. There is no tunneling or undermining noted. There is a medium amount of serosanguineous drainage noted. There is small (1-33%) pink, pale granulation within the wound bed. There is a large (67-100%) amount of necrotic tissue within the wound bed including Eschar and Adherent Slough. The periwound skin appearance exhibited: Callus. 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 There was a Excisional Skin/Subcutaneous Tissue Debridement with a total area of 4 sq cm performed by Fredirick Maudlin, MD. With the following instrument(s): Blade, and  Curette to remove Non-Viable tissue/material. Material removed includes Callus, Subcutaneous Tissue, and Slough after achieving pain control using Lidocaine 5% topical ointment. No specimens were taken. A time out was conducted at 14:10, prior to the start of the procedure. A Minimum amount of bleeding was controlled with Pressure. The procedure was tolerated well with a pain level of 0 throughout and a pain level of 0 following the procedure. Post Debridement Measurements: 2cm length x 2cm width x 0.4cm depth; 1.257cm^3 volume. Character of Wound/Ulcer Post Debridement is improved. Severity of Tissue Post Debridement is: Fat layer exposed. Post procedure Diagnosis Wound #4: Same as Pre-Procedure NATE, COMMON ([id number]) [id number] Page 12 of 15 General Notes: Scribed for Dr Celine Ahr by Sharyn Creamer, RN. Plan Follow-up Appointments: Return Appointment in 1 week. Anesthetic: Wound #4 Left,Medial Foot: (In clinic) Topical Lidocaine 5% applied to wound bed Bathing/ Shower/ Hygiene: May shower with protection but do not get wound dressing(s) wet. Protect dressing(s) with water repellant cover (for example, large plastic bag) or a cast cover and may then take shower. Home Health: Wound #4 Left,Medial Foot: Admit to Home Health for skilled nursing wound care. May utilize formulary equivalent dressing for wound treatment orders unless otherwise specified. New wound care orders this week; continue Home Health for wound care. May utilize formulary equivalent dressing for wound treatment orders unless otherwise specified. Dressing changes to be completed by [place] on Tuesday / Thursday / Saturday except when patient has scheduled visit at [facility].  Laboratory ordered were: Anaerobic culture Radiology ordered were: X-ray, foot - evaluation for osteomyelitis The following medication(s) was prescribed: lidocaine topical 5 % ointment ointment topical once  daily for prior to debridement was prescribed at facility WOUND #4: - Foot Wound Laterality: Left, Medial Cleanser: Wound Cleanser (DME) (Generic) 1 x Per Day/10 Days Discharge Instructions: Cleanse the wound with wound cleanser prior to applying a clean dressing using gauze sponges, not tissue or cotton balls. Prim Dressing: Hydrofera Blue Classic Foam Rope Dressing, 9x6 (mm/in) (DME) (Generic) 1 x Per Day/10 Days ary Discharge Instructions: Moisten with saline prior to packing Secondary Dressing: ABD Pad, 5x9 (DME) (Generic) 1 x Per Day/10 Days Discharge Instructions: Apply over primary dressing as directed. Secondary Dressing: Optifoam Non-Adhesive Dressing, 4x4 in (DME) (Generic) 1 x Per Day/10 Days Discharge Instructions: Apply over primary dressing as directed. Secondary Dressing: Woven Gauze Sponge, Non-Sterile 4x4 in (DME) (Generic) 1 x Per Day/10 Days Discharge Instructions: Apply over primary dressing as directed. Secured With: [employer], 4.5x3.1 (in/yd) (DME) (Generic) 1 x Per Day/10 Days Discharge Instructions: Secure with Kerlix as directed. Secured With: 53M Medipore H Soft Cloth Surgical T ape, 4 x 10 (in/yd) (DME) (Generic) 1 x Per Day/10 Days Discharge Instructions: Secure with tape as directed. 12/08/2022: There is a thick protruding callus on the plantar surface of the first metatarsal head. It has peeled away slightly from the skin surface. After debridement of the callus, a fissure-like ulcer was identified. This does probe to bone. I used a combination of scalpel and forceps, nippers, and a curette to debride away the callus. I then debrided the slough and nonviable subcutaneous tissue from the wound itself. Upon doing so, I identified that the ulcer probed all the way to bone. Based upon the odor coming from the wound, I also took a culture. I am going to send him to get x-rays to look for osteomyelitis. Once I have culture data and radiographs, we may need to  consider infectious disease consultation for antibiotic therapy. For now, we will pack the wound with Hydrofera Blue and use a foam donut with Kerlix and Ace bandage wrapping. Due to the fact that he has absence of his right leg, I do not think I can put him in a forefoot offloading shoe without risk of fall. He will return to clinic in 1 week's time. Electronic Signature(s) Signed: 12/08/2022 2:57:50 PM By: Fredirick Maudlin MD FACS Entered By: Fredirick Maudlin on 12/08/2022 14:57:50 -------------------------------------------------------------------------------- HxROS Details Patient Name: Date of Service: [name] [name], [name] MIE [name]. 12/08/2022 1:15 PM Medical Record Number: [id number] Patient Account Number: [id number] Date of Birth/Sex: Treating RN: 1971-02-04 (52 y.o. Mare Ferrari Primary Care Provider: Henrene Hawking DISH, MA YURI Other Clinician: Referring Provider: Treating Provider/Extender: Nyra Jabs GA DISH, MA YURI Weeks in Treatment: 0 Information Obtained From Patient Chart [name] [name], [name] [name] ([id number]) [id number] Page 13 of 15 Constitutional Symptoms (General Health) Medical History: Past Medical History Notes: falls , leukocytosis Eyes Medical History: Negative for: Cataracts; Glaucoma; Optic Neuritis Ear/Nose/Mouth/Throat Medical History: Negative for: Chronic sinus problems/congestion; Middle ear problems Hematologic/Lymphatic Medical History: Positive for: Anemia Negative for: Hemophilia; Human Immunodeficiency Virus; Lymphedema; Sickle Cell Disease Respiratory Medical History: Negative for: Aspiration; Asthma; Chronic Obstructive Pulmonary Disease (COPD); Pneumothorax; Sleep Apnea; Tuberculosis Past Medical History Notes: During waking hours and catches himself not breathing, "gasp for air". Saw Dr Wynonia Lawman, he couldn't find a reason Cardiovascular Medical History: Positive for: Hypertension; Peripheral Arterial Disease;  Peripheral Venous Disease Negative for:  Angina; Arrhythmia; Congestive Heart Failure; Coronary Artery Disease; Deep Vein Thrombosis; Hypotension; Myocardial Infarction; Phlebitis; Vasculitis Past Medical History Notes: hyperlipidemia Gastrointestinal Medical History: Negative for: Cirrhosis ; Colitis; Crohns; Hepatitis A; Hepatitis B; Hepatitis C Past Medical History Notes: GERD Endocrine Medical History: Positive for: Type II Diabetes - oral meds Negative for: Type I Diabetes Time with diabetes: 2014 Treated with: Insulin, Oral agents Blood sugar tested every day: Yes Tested : CGM Blood sugar testing results: Breakfast: 91 Genitourinary Medical History: Negative for: End Stage Renal Disease Past Medical History Notes: stage IV CKD Immunological Medical History: Negative for: Lupus Erythematosus; Raynauds; Scleroderma Integumentary (Skin) Medical History: Negative for: History of Burn Musculoskeletal Medical History: Negative for: Gout; Rheumatoid Arthritis; Osteoarthritis; Osteomyelitis Neurologic [name] [name], Osie [name] ([id number]) [id number] Page 14 of 15 Medical History: Positive for: Neuropathy Negative for: Dementia; Quadriplegia; Paraplegia; Seizure Disorder Oncologic Medical History: Negative for: Received Chemotherapy; Received Radiation Psychiatric Medical History: Negative for: Anorexia/bulimia; Confinement Anxiety Immunizations Pneumococcal Vaccine: Received Pneumococcal Vaccination: No Implantable Devices None Hospitalization / Surgery History Type of Hospitalization/Surgery fever 105, sepsis cellulitis right popliteal fossa 04/09/2021 2018 necrotizing fasciitis Family and Social History Cancer: No; Diabetes: Yes - Maternal Grandparents,Mother; Heart Disease: Yes - Father; Hereditary Spherocytosis: No; Hypertension: Yes - Father; Kidney Disease: No; Lung Disease: No; Seizures: No; Stroke: No; Thyroid Problems: No;  Tuberculosis: No; Current every day smoker; Marital Status - Single; Alcohol Use: Never; Drug Use: No History; Caffeine Use: Never; Financial Concerns: No; Food, Clothing or Shelter Needs: No; Support System Lacking: No; Transportation Concerns: No Engineer, maintenance) Signed: 12/08/2022 3:55:34 PM By: Fredirick Maudlin MD FACS Signed: 12/08/2022 3:57:27 PM By: Sharyn Creamer RN, BSN Previous Signature: 12/08/2022 1:39:54 PM Version By: Fredirick Maudlin MD FACS Entered By: Sharyn Creamer on 12/08/2022 13:43:53 -------------------------------------------------------------------------------- SuperBill Details Patient Name: Date of Service: [name] [name], [name] MIE [name]. 12/08/2022 Medical Record Number: [id number] Patient Account Number: [id number] Date of Birth/Sex: Treating RN: [birth year] (52 y.o. M) Primary Care Provider: Henrene Hawking DISH, MA YURI Other Clinician: Referring Provider: Treating Provider/Extender: Nyra Jabs GA DISH, MA YURI Weeks in Treatment: 0 Diagnosis Coding ICD-10 Codes Code Description L97.526 Non-pressure chronic ulcer of other part of left foot with bone involvement without evidence of necrosis E11.621 Type 2 diabetes mellitus with foot ulcer N18.5 Chronic kidney disease, stage 5 F17.200 Nicotine dependence, unspecified, uncomplicated A35.57 Type 2 diabetes mellitus with hyperglycemia Z89.511 Acquired absence of right leg below knee Facility Procedures : [place] Code: [id number] , Brenn [name] ([id number] Description: 11042 - DEB SUBQ TISSUE 20 SQ CM/< ICD-10 Diagnosis Description L97.526 Non-pressure chronic ulcer of other part of left foot with bone involvement wit 3348) [id number] Modifier: hout evidence of necr [id number].pdf Pag Quantity: 1 osis e 15 of 15 Physician Procedures : CPT4 Code Description Modifier [id number] 17616 - WC PHYS LEVEL 4 - EST PT 25 ICD-10 Diagnosis Description L97.526 Non-pressure chronic ulcer of other part of left foot with  bone involvement without evidence of necr E11.621 Type 2 diabetes mellitus with  foot ulcer N18.5 Chronic kidney disease, stage 5 F17.200 Nicotine dependence, unspecified, uncomplicated Quantity: 1 osis : [id number] 11042 - WC PHYS SUBQ TISS 20 SQ CM ICD-10 Diagnosis Description L97.526 Non-pressure chronic ulcer of other part of left foot with bone involvement without evidence of necr Quantity: 1 osis Electronic Signature(s) Signed: 12/08/2022 2:58:10 PM By: Fredirick Maudlin MD FACS Entered By: Fredirick Maudlin on 12/08/2022 14:58:10

## 2022-12-08 NOTE — Progress Notes (Signed)
[name] [name] ([id number]) [phone].pdf Page 1 of 4 Visit Report for 12/08/2022 Abuse Risk Screen Details Patient Name: Date of Service: [name] [name] 12/08/2022 1:15 PM Medical Record Number: [id number] Patient Account Number: [id number] Date of Birth/Sex: Treating RN: [birth year] (52 y.o. [name] [name] Primary Care [name] [name]: [name] [name] DISH, MA [name] [name] Other Clinician: Referring [name] [name]: Treating [name] [name]/Extender: [name] [name] DISH, MA [name] [name] Weeks in Treatment: 0 Abuse Risk Screen Items Answer ABUSE RISK SCREEN: Has anyone close to you tried to hurt or harm you recentlyo No Do you feel uncomfortable with anyone in your familyo No Has anyone forced you do things that you didnt want to doo No Electronic Signature(s) Signed: 12/08/2022 3:57:27 PM By: Sharyn Creamer RN, BSN Entered By: Sharyn Creamer on 12/08/2022 13:44:05 -------------------------------------------------------------------------------- Activities of Daily Living Details Patient Name: Date of Service: [name] Arrow MIE [name]. 12/08/2022 1:15 PM Medical Record Number: [id number] Patient Account Number: [id number] Date of Birth/Sex: Treating RN: 09/04/1971 (53 y.o. [name] [name] Primary Care [name] [name]: [name] [name] DISH, MA [name] [name] Other Clinician: Referring [name] [name]: Treating [name] [name]/Extender: [name] [name] DISH, MA [name] [name] Weeks in Treatment: 0 Activities of Daily Living Items Answer Activities of Daily Living (Please select one for each item) Drive Automobile Completely Able T Medications ake Completely Able Use T elephone Completely Able Care for Appearance Completely Able Use T oilet Completely Able Bath / Shower Completely Able Dress Self Completely Able Feed Self Completely Able Walk Completely Able Get In / Out Bed Completely Able Housework Completely Able Prepare Meals Completely [name] [name] for Self Completely  Able Electronic Signature(s) Signed: 12/08/2022 3:57:27 PM By: Sharyn Creamer RN, BSN Entered By: Sharyn Creamer on 12/08/2022 13:44:33 [name] ([id number]) [id number] Page 2 of 4 -------------------------------------------------------------------------------- Education Screening Details Patient Name: Date of Service: [name] [name] 12/08/2022 1:15 PM Medical Record Number: [id number] Patient Account Number: [id number] Date of Birth/Sex: Treating RN: 04/23/1971 (52 y.o. [name] [name] Primary Care [name] [name]: [name] [name] DISH, MA [name] [name] Other Clinician: Referring [name] [name]: Treating [name] [name]/Extender: [name] [name] DISH, MA [name] [name] Weeks in Treatment: 0 Learning Preferences/Education Level/Primary Language Learning Preference: Explanation, Demonstration, Printed Material Preferred Language: English Cognitive Barrier Language Barrier: No Translator Needed: No Memory Deficit: No Emotional Barrier: No Cultural/Religious Beliefs Affecting Medical Care: No Physical Barrier Impaired Vision: No Impaired Hearing: No Decreased Hand dexterity: No Knowledge/Comprehension Knowledge Level: High Comprehension Level: High Ability to understand written instructions: High Ability to understand verbal instructions: High Motivation Anxiety Level: Calm Cooperation: Cooperative Education Importance: Acknowledges Need Interest in Health Problems: Asks Questions Perception: Coherent Willingness to Engage in Self-Management High Activities: Readiness to Engage in Self-Management High Activities: Electronic Signature(s) Signed: 12/08/2022 3:57:27 PM By: Sharyn Creamer RN, BSN Entered By: Sharyn Creamer on 12/08/2022 13:45:15 -------------------------------------------------------------------------------- Fall Risk Assessment Details Patient Name: Date of Service: [name] [name], [name] MIE [name]. 12/08/2022 1:15 PM Medical Record Number:  [id number] Patient Account Number: [id number] Date of Birth/Sex: Treating RN: 04-19-1971 (52 y.o. [name] [name] Primary Care [name] [name]: [name] [name] DISH, MA [name] [name] Other Clinician: Referring Dimple Bastyr: Treating [name] [name]/Extender: [name] [name] DISH, MA [name] [name] Weeks in Treatment: 0 Fall Risk Assessment Items Have you had 2 or more falls in the last 12 monthso 0 No Have you had any fall that resulted in injury in the last 12 monthso 0 No FALLS RISK SCREEN [name] [name] ([id number]) [id number] Page 3 of 4 History of falling - immediate or within  3 months 25 Yes Secondary diagnosis (Do you have 2 or more medical diagnoseso) 0 No Ambulatory aid None/bed rest/wheelchair/nurse 0 Yes Crutches/cane/walker 0 No Furniture 0 No Intravenous therapy Access/Saline/Heparin Lock 0 No Gait/Transferring Normal/ bed rest/ wheelchair 0 Yes Weak (short steps with or without shuffle, stooped but able to lift head while walking, may seek 0 No support from furniture) Impaired (short steps with shuffle, may have difficulty arising from chair, head down, impaired 0 No balance) Mental Status Oriented to own ability 0 No Electronic Signature(s) Signed: 12/08/2022 3:57:27 PM By: Sharyn Creamer RN, BSN Entered By: Sharyn Creamer on 12/08/2022 13:45:42 -------------------------------------------------------------------------------- Foot Assessment Details Patient Name: Date of Service: [name] [name], [name] MIE [name]. 12/08/2022 1:15 PM Medical Record Number: [id number] Patient Account Number: [id number] Date of Birth/Sex: Treating RN: [birth year] (52 y.o. [name] [name] Primary Care Kirsta Probert: [name] [name] DISH, MA [name] [name] Other Clinician: Referring [name] [name]: Treating [name] [name]/Extender: [name] [name] DISH, MA [name] [name] Weeks in Treatment: 0 Foot Assessment Items Site Locations + = Sensation present, - = Sensation absent, C = Callus, U = Ulcer R = Redness, W = Warmth, M =  Maceration, PU = Pre-ulcerative lesion F = Fissure, S = Swelling, D = Dryness Assessment Right: Left: Other Deformity: No No Prior Foot Ulcer: No No Prior Amputation: No No Charcot Joint: No No Ambulatory Status: GaitWILLIOM, [name] ([id number]) [id number] Page 4 of 4 Electronic Signature(s) Signed: 12/08/2022 3:57:27 PM By: Sharyn Creamer RN, BSN Entered By: Sharyn Creamer on 12/08/2022 13:48:55 -------------------------------------------------------------------------------- Nutrition Risk Screening Details Patient Name: Date of Service: [name] [name], [name] MIE [name]. 12/08/2022 1:15 PM Medical Record Number: [id number] Patient Account Number: [id number] Date of Birth/Sex: Treating RN: 02-25-1971 (52 y.o. [name] [name] Primary Care Amisadai Woodford: [name] [name] DISH, MA [name] [name] Other Clinician: Referring Porschia Willbanks: Treating Shannette Tabares/Extender: [name] [name] DISH, MA [name] [name] Weeks in Treatment: 0 Height (in): 77 Weight (lbs): 240 Body Mass Index (BMI): 28.5 Nutrition Risk Screening Items Score Screening NUTRITION RISK SCREEN: I have an illness or condition that made me change the kind and/or amount of food I eat 0 No I eat fewer than two meals per day 0 No I eat few fruits and vegetables, or milk products 0 No I have three or more drinks of beer, liquor or wine almost every day 0 No I have tooth or mouth problems that make it hard for me to eat 0 No I don't always have enough money to buy the food I need 0 No I eat alone most of the time 0 No I take three or more different prescribed or over-the-counter drugs a day 1 Yes Without wanting to, I have lost or gained 10 pounds in the last six months 0 No I am not always physically able to shop, cook and/or feed myself 0 No Nutrition Protocols Good Risk Protocol Moderate Risk Protocol High Risk Proctocol Risk Level: Good Risk Score: 1 Electronic Signature(s) Signed: 12/08/2022 3:57:27 PM By: Sharyn Creamer  RN, BSN Entered By: Sharyn Creamer on 12/08/2022 13:45:52

## 2022-12-08 NOTE — Telephone Encounter (Signed)
Patient calls nurse line requesting prescription for prosthetic sleeves to be sent to the [facility].   Once order is placed, prescription and patient demographics will need to be faxed to [phone].   Talbot Grumbling, RN

## 2022-12-08 NOTE — Progress Notes (Signed)
DERL, ABALOS ([id number]) [id number] Page 1 of 9 Visit Report for 12/08/2022 Allergy List Details Patient Name: Date of Service: [name] [name] 12/08/2022 1:15 PM Medical Record Number: [id number] Patient Account Number: [id number] Date of Birth/Sex: Treating RN: September 05, 1971 (52 y.o. [name] [name] Primary Care [name] [name]: [name] [name] DISH, MA [name] Other Clinician: Referring [name] [name]: Treating [name] [name]/Extender: [name] [name] GA DISH, MA [name] Weeks in Treatment: 0 Allergies Active Allergies adhesive tape Reaction: rash Severity: Mild Fer-Iron Allergy Notes Electronic Signature(s) Signed: 12/08/2022 3:57:27 PM By: Sharyn Creamer RN, BSN Entered By: Sharyn Creamer on 12/08/2022 13:43:23 -------------------------------------------------------------------------------- Arrival Information Details Patient Name: Date of Service: [name] [name] [name], [name] MIE [name]. 12/08/2022 1:15 PM Medical Record Number: [id number] Patient Account Number: [id number] Date of Birth/Sex: Treating RN: 28-Sep-1971 (52 y.o. [name] [name] Primary Care [name] [name]: [name] [name] DISH, MA [name] Other Clinician: Referring Kairyn Olmeda: Treating [name] [name]/Extender: [name] [name] GA DISH, MA [name] Weeks in Treatment: 0 Visit Information Patient Arrived: Ambulatory Arrival Time: 13:36 Accompanied By: self Transfer Assistance: None Patient Identification Verified: Yes Secondary Verification Process Completed: Yes History Since Last Visit Added or deleted any medications: No Any new allergies or adverse reactions: No Had a fall or experienced change in activities of daily living that may affect risk of falls: No Signs or symptoms of abuse/neglect since last visito No Hospitalized since last visit: No Implantable device outside of the clinic excluding cellular tissue based products placed in the center since last visit: No Has Dressing in Place as Prescribed: Yes Pain Present Now:  Yes Electronic Signature(s) Signed: 12/08/2022 3:57:27 PM By: Sharyn Creamer RN, BSN Entered By: Sharyn Creamer on 12/08/2022 13:39:05 [name] [name] ([id number]) [id number] Page 2 of 9 -------------------------------------------------------------------------------- Clinic Level of Care Assessment Details Patient Name: Date of Service: [name] [name] 12/08/2022 1:15 PM Medical Record Number: [id number] Patient Account Number: [id number] Date of Birth/Sex: Treating RN: 08-Sep-1971 (52 y.o. [name] [name] Primary Care [name] [name]: [name] [name] DISH, MA [name] Other Clinician: Referring [name] [name]: Treating [name] [name]/Extender: [name] [name] GA DISH, MA [name] Weeks in Treatment: 0 Clinic Level of Care Assessment Items TOOL 3 Quantity Score X- 1 0 Use when EandM and Procedure is performed on FOLLOW-UP visit ASSESSMENTS - Nursing Assessment / Reassessment X- 1 10 Reassessment of Co-morbidities (includes updates in patient status) X- 1 5 Reassessment of Adherence to Treatment Plan ASSESSMENTS - Wound and Skin Assessment / Reassessment '[]'$  - Points for Wound Assessment can only be taken for a new wound of unknown or different etiology and a procedure is 0 NOT performed to that wound X- 1 5 Simple Wound Assessment / Reassessment - one wound '[]'$  - 0 Complex Wound Assessment / Reassessment - multiple wounds '[]'$  - 0 Dermatologic / Skin Assessment (not related to wound area) ASSESSMENTS - Focused Assessment X- 1 5 Circumferential Edema Measurements - multi extremities '[]'$  - 0 Nutritional Assessment / Counseling / Intervention '[]'$  - 0 Lower Extremity Assessment (monofilament, tuning fork, pulses) '[]'$  - 0 Peripheral Arterial Disease Assessment (using hand held doppler) ASSESSMENTS - Ostomy and/or Continence Assessment and Care '[]'$  - 0 Incontinence Assessment and Management '[]'$  - 0 Ostomy Care Assessment and Management (repouching, etc.) PROCESS - Coordination of  Care '[]'$  - Points for Discharge Coordination can only be taken for a new wound of unknown or different etiology and a procedure 0 is NOT performed to that wound X- 1 15 Simple Patient / Family Education for ongoing care '[]'$  - 0 Complex (extensive) Patient / Family  Education for ongoing care '[]'$  - 0 Staff obtains Consents, Records, T Results / Process Orders est X- 1 10 Staff telephones HHA, Nursing Homes / Clarify orders / etc '[]'$  - 0 Routine Transfer to another Facility (non-emergent condition) '[]'$  - 0 Routine Hospital Admission (non-emergent condition) '[]'$  - 0 New Admissions / Biomedical engineer / Ordering NPWT Apligraf, etc. , '[]'$  - 0 Emergency Hospital Admission (emergent condition) '[]'$  - 0 Simple Discharge Coordination '[]'$  - 0 Complex (extensive) Discharge Coordination PROCESS - Special Needs '[]'$  - 0 Pediatric / Minor Patient Management '[]'$  - 0 Isolation Patient Management '[]'$  - 0 Hearing / Language / Visual special needs '[]'$  - 0 Assessment of Community assistance (transportation, D/C planning, etc.) [name] [name], [name] [name] ([id number]) [id number] Page 3 of 9 '[]'$  - 0 Additional assistance / Altered mentation '[]'$  - 0 Support Surface(s) Assessment (bed, cushion, seat, etc.) INTERVENTIONS - Wound Cleansing / Measurement '[]'$  - Points for Wound Cleaning / Measurement, Wound Dressing, Specimen Collection and Specimen taken to lab can only 0 be taken for a new wound of unknown or different etiology and a procedure is NOT performed to that wound X- 1 5 Simple Wound Cleansing - one wound '[]'$  - 0 Complex Wound Cleansing - multiple wounds X- 1 5 Wound Imaging (photographs - any number of wounds) '[]'$  - 0 Wound Tracing (instead of photographs) X- 1 5 Simple Wound Measurement - one wound '[]'$  - 0 Complex Wound Measurement - multiple wounds INTERVENTIONS - Wound Dressings '[]'$  - 0 Small Wound Dressing one or multiple wounds X- 1 15 Medium Wound Dressing one or  multiple wounds '[]'$  - 0 Large Wound Dressing one or multiple wounds INTERVENTIONS - Miscellaneous '[]'$  - 0 External ear exam '[]'$  - 0 Specimen Collection (cultures, biopsies, blood, body fluids, etc.) '[]'$  - 0 Specimen(s) / Culture(s) sent or taken to Lab for analysis '[]'$  - 0 Patient Transfer (multiple staff / Civil Service fast streamer / Similar devices) '[]'$  - 0 Simple Staple / Suture removal (25 or less) '[]'$  - 0 Complex Staple / Suture removal (26 or more) '[]'$  - 0 Hypo / Hyperglycemic Management (close monitor of Blood Glucose) X- 1 15 Ankle / Brachial Index (ABI) - do not check if billed separately X- 1 5 Vital Signs Has the patient been seen at the hospital within the last three years: Yes Total Score: 100 Level Of Care: New/Established - Level 3 Electronic Signature(s) Signed: 12/08/2022 3:57:27 PM By: Sharyn Creamer RN, BSN Entered By: Sharyn Creamer on 12/08/2022 15:27:02 -------------------------------------------------------------------------------- Encounter Discharge Information Details Patient Name: Date of Service: [name] [name] [name], [name] MIE [name]. 12/08/2022 1:15 PM Medical Record Number: [id number] Patient Account Number: [id number] Date of Birth/Sex: Treating RN: 09-08-1971 (52 y.o. [name] [name] Primary Care Deo Mehringer: [name] [name] DISH, MA [name] Other Clinician: Referring Meliton Samad: Treating Wyley Hack/Extender: [name] [name] GA DISH, MA [name] Weeks in Treatment: 0 Encounter Discharge Information Items Post Procedure Vitals Discharge Condition: Stable Temperature (F): 97.7 Ambulatory Status: Ambulatory Pulse (bpm): 94 Discharge Destination: Home Respiratory Rate (breaths/min): 18 Transportation: Private Auto Blood Pressure (mmHg): 124/84 Accompanied By: self Schedule Follow-up Appointment: Yes Clinical Summary of Care: Patient [name], [name] ([id number]) [id number] Page 4 of 9 Electronic Signature(s) Signed: 12/08/2022 3:57:27 PM By: Sharyn Creamer  RN, BSN Entered By: Sharyn Creamer on 12/08/2022 15:28:46 -------------------------------------------------------------------------------- Lower Extremity Assessment Details Patient Name: Date of Service: [name] [name], [name] MIE [name]. 12/08/2022 1:15 PM Medical Record Number: [id number] Patient Account Number: [id number] Date of Birth/Sex: Treating RN: 10-28-1971 (52 y.o. [name] [name]  Primary Care Mabeline Varas: [name] [name] DISH, MA [name] Other Clinician: Referring Brae Gartman: Treating Lewayne Pauley/Extender: [name] [name] GA DISH, MA [name] Weeks in Treatment: 0 Edema Assessment Assessed: [Left: Yes] [Right: No] [Left: Edema] [Right: :] Calf Left: Right: Point of Measurement: From Medial Instep 38 cm Ankle Left: Right: Point of Measurement: From Medial Instep 23.7 cm Vascular Assessment Pulses: Dorsalis Pedis Palpable: [Left:Yes] Blood Pressure: Brachial: [Left:124] Ankle: [Left:Dorsalis Pedis: 160 1.29] Electronic Signature(s) Signed: 12/08/2022 3:57:27 PM By: Sharyn Creamer RN, BSN Entered By: Sharyn Creamer on 12/08/2022 14:03:14 -------------------------------------------------------------------------------- Multi Wound Chart Details Patient Name: Date of Service: [name] [name] [name], [name] MIE [name]. 12/08/2022 1:15 PM Medical Record Number: [id number] Patient Account Number: [id number] Date of Birth/Sex: Treating RN: 01-20-1971 (52 y.o. M) Primary Care Maryana Pittmon: [name] [name] DISH, MA [name] Other Clinician: Referring Dorthy Magnussen: Treating Jc Veron/Extender: [name] [name] GA DISH, MA [name] Weeks in Treatment: 0 Vital Signs Height(in): 77 Capillary Blood Glucose(mg/dl): 228 Weight(lbs): 240 Pulse(bpm): 94 Body Mass Index(BMI): 28.5 Blood Pressure(mmHg): 124/84 Temperature(F): 97.7 Respiratory Rate(breaths/min): 18 [name] [name], [name] [name] ([id number]) [id number] Page 5 of 9 [4:Photos:] [N/A:N/A] Left, Medial Foot N/A N/A Wound Location: Gradually Appeared N/A N/A Wounding  Event: Diabetic Wound/Ulcer of the Lower N/A N/A Primary Etiology: Extremity Anemia, Hypertension, Peripheral N/A N/A Comorbid History: Arterial Disease, Peripheral Venous Disease, Type II Diabetes, Neuropathy 10/07/2022 N/A N/A Date Acquired: 0 N/A N/A Weeks of Treatment: Open N/A N/A Wound Status: No N/A N/A Wound Recurrence: 0.5x1.6x0.1 N/A N/A Measurements [name] x W x D (cm) 0.628 N/A N/A A (cm) : rea 0.063 N/A N/A Volume (cm) : Grade 2 N/A N/A Classification: Medium N/A N/A Exudate A mount: Serosanguineous N/A N/A Exudate Type: red, brown N/A N/A Exudate Color: Small (1-33%) N/A N/A Granulation A mount: Pink, Pale N/A N/A Granulation Quality: Large (67-100%) N/A N/A Necrotic A mount: Eschar, Adherent Slough N/A N/A Necrotic Tissue: Fat Layer (Subcutaneous Tissue): Yes N/A N/A Exposed Structures: Fascia: No Tendon: No Muscle: No Joint: No Bone: No None N/A N/A Epithelialization: Debridement - Excisional N/A N/A Debridement: Pre-procedure Verification/Time Out 14:10 N/A N/A Taken: Lidocaine 5% topical ointment N/A N/A Pain Control: Callus, Subcutaneous, Slough N/A N/A Tissue Debrided: Skin/Subcutaneous Tissue N/A N/A Level: 4 N/A N/A Debridement A (sq cm): rea Blade, Curette N/A N/A Instrument: Minimum N/A N/A Bleeding: Pressure N/A N/A Hemostasis A chieved: 0 N/A N/A Procedural Pain: 0 N/A N/A Post Procedural Pain: Procedure was tolerated well N/A N/A Debridement Treatment Response: 2x2x0.4 N/A N/A Post Debridement Measurements [name] x W x D (cm) 1.257 N/A N/A Post Debridement Volume: (cm) Callus: Yes N/A N/A Periwound Skin Texture: Debridement N/A N/A Procedures Performed: Treatment Notes Electronic Signature(s) Signed: 12/08/2022 2:46:32 PM By: Fredirick Maudlin MD FACS Entered By: Fredirick Maudlin on 12/08/2022 14:46:32 -------------------------------------------------------------------------------- Multi-Disciplinary Care Plan  Details Patient Name: Date of Service: [name] [name] [name], [name] MIE [name]. 12/08/2022 1:15 PM Medical Record Number: [id number] Patient Account Number: [id number] Date of Birth/Sex: Treating RN: [birth year] (52 y.o. Joylene Igo, [address], Lavontae Carlean Jews ([id number]) [phone].pdf Page 6 of 9 Primary Care Rayven Hendrickson: [name] [name] DISH, MA [name] Other Clinician: Referring Genavieve Mangiapane: Treating Mikaili Flippin/Extender: [name] [name] GA DISH, MA [name] Weeks in Treatment: 0 Active Inactive Wound/Skin Impairment Nursing Diagnoses: Impaired tissue integrity Knowledge deficit related to ulceration/compromised skin integrity Goals: Patient/caregiver will verbalize understanding of skin care regimen Date Initiated: 12/08/2022 Target Resolution Date: 01/05/2023 Goal Status: Active Ulcer/skin breakdown will have a volume reduction of 30% by week 4 Date Initiated: 12/08/2022 Target Resolution Date: 01/05/2023 Goal Status: Active Interventions:  Assess patient/caregiver ability to obtain necessary supplies Assess patient/caregiver ability to perform ulcer/skin care regimen upon admission and as needed Assess ulceration(s) every visit Provide education on smoking Provide education on ulcer and skin care Notes: Electronic Signature(s) Signed: 12/08/2022 3:57:27 PM By: Sharyn Creamer RN, BSN Entered By: Sharyn Creamer on 12/08/2022 14:14:26 -------------------------------------------------------------------------------- Pain Assessment Details Patient Name: Date of Service: [name] [name] [name], [name] MIE [name]. 12/08/2022 1:15 PM Medical Record Number: [id number] Patient Account Number: [id number] Date of Birth/Sex: Treating RN: 12/01/1970 (52 y.o. [name] [name] Primary Care Shavontae Gibeault: [name] [name] DISH, MA [name] Other Clinician: Referring Fue Cervenka: Treating Tumeka Chimenti/Extender: [name] [name] GA DISH, MA [name] Weeks in Treatment: 0 Active Problems Location of Pain Severity and Description of Pain Patient Has Paino  Yes Site Locations Rate the pain. Current Pain Level: 6 Hatton, Tan [name] ([id number]) [id number] Page 7 of 9 Pain Management and Medication Current Pain Management: Electronic Signature(s) Signed: 12/08/2022 3:57:27 PM By: Sharyn Creamer RN, BSN Entered By: Sharyn Creamer on 12/08/2022 13:52:47 -------------------------------------------------------------------------------- Patient/Caregiver Education Details Patient Name: Date of Service: [name] [name] 1/16/2024andnbsp1:15 PM Medical Record Number: [id number] Patient Account Number: [id number] Date of Birth/Gender: Treating RN: 1971/04/01 (52 y.o. [name] [name] Primary Care Physician: [name] [name] DISH, MA [name] Other Clinician: Referring Physician: Treating Physician/Extender: [name] [name] GA DISH, MA [name] Weeks in Treatment: 0 Education Assessment Education Provided To: Patient Education Topics Provided Wound/Skin Impairment: Methods: Explain/Verbal Responses: State content correctly Electronic Signature(s) Signed: 12/08/2022 3:57:27 PM By: Sharyn Creamer RN, BSN Entered By: Sharyn Creamer on 12/08/2022 14:14:39 -------------------------------------------------------------------------------- Wound Assessment Details Patient Name: Date of Service: [name] [name] [name], [name] MIE [name]. 12/08/2022 1:15 PM Medical Record Number: [id number] Patient Account Number: [id number] Date of Birth/Sex: Treating RN: [birth year] (52 y.o. [name] [name] Primary Care Caryl Fate: [name] [name] DISH, MA [name] Other Clinician: Referring Jerimyah Vandunk: Treating Stepahnie Campo/Extender: [name] [name] GA DISH, MA [name] Weeks in Treatment: 0 Wound Status Wound Number: 4 Primary Diabetic Wound/Ulcer of the Lower Extremity Etiology: Wound Location: Left, Medial Foot Wound Open Wounding Event: Gradually Appeared Status: Date Acquired: 10/07/2022 Comorbid Anemia, Hypertension, Peripheral Arterial Disease, Peripheral Weeks Of Treatment:  0 History: Venous Disease, Type II Diabetes, Neuropathy Clustered Wound: No Photos [name] [name], [name] [name] ([id number]) [id number] Page 8 of 9 Wound Measurements Length: (cm) 0.5 Width: (cm) 1.6 Depth: (cm) 0.1 Area: (cm) 0.628 Volume: (cm) 0.063 % Reduction in Area: % Reduction in Volume: Epithelialization: None Tunneling: No Undermining: No Wound Description Classification: Grade 2 Exudate Amount: Medium Exudate Type: Serosanguineous Exudate Color: red, brown Foul Odor After Cleansing: No Slough/Fibrino Yes Wound Bed Granulation Amount: Small (1-33%) Exposed Structure Granulation Quality: Pink, Pale Fascia Exposed: No Necrotic Amount: Large (67-100%) Fat Layer (Subcutaneous Tissue) Exposed: Yes Necrotic Quality: Eschar, Adherent Slough Tendon Exposed: No Muscle Exposed: No Joint Exposed: No Bone Exposed: No Periwound Skin Texture Texture Color No Abnormalities Noted: No No Abnormalities Noted: No Callus: Yes Moisture No Abnormalities Noted: No Treatment Notes Wound #4 (Foot) Wound Laterality: Left, Medial Cleanser Wound Cleanser Discharge Instruction: Cleanse the wound with wound cleanser prior to applying a clean dressing using gauze sponges, not tissue or cotton balls. Peri-Wound Care Topical Primary Dressing Hydrofera Blue Classic Foam Rope Dressing, 9x6 (mm/in) Discharge Instruction: Moisten with saline prior to packing Secondary Dressing ABD Pad, 5x9 Discharge Instruction: Apply over primary dressing as directed. Optifoam Non-Adhesive Dressing, 4x4 in Discharge Instruction: Apply over primary dressing as directed. Woven Gauze Sponge, Non-Sterile 4x4 in Discharge Instruction: Apply over primary dressing as directed. Secured  With [employer] Sterile, 4.5x3.1 (in/yd) Discharge Instruction: Secure with Kerlix as directed. 48M Medipore H Soft Cloth Surgical T ape, 4 x 10 (in/yd) Discharge Instruction: Secure with tape as  directed. Compression Wrap [name] [name], [name] [name] ([id number]) [id number] Page 9 of 9 Compression Stockings Add-Ons Electronic Signature(s) Signed: 12/08/2022 3:57:27 PM By: Sharyn Creamer RN, BSN Entered By: Sharyn Creamer on 12/08/2022 13:54:26 -------------------------------------------------------------------------------- Vitals Details Patient Name: Date of Service: [name] [name], [name] MIE [name]. 12/08/2022 1:15 PM Medical Record Number: [id number] Patient Account Number: [id number] Date of Birth/Sex: Treating RN: 12-15-1970 (52 y.o. [name] [name] Primary Care Shaana Acocella: [name] [name] DISH, MA [name] Other Clinician: Referring Aidenn Skellenger: Treating Irineo Gaulin/Extender: [name] [name] GA DISH, MA [name] Weeks in Treatment: 0 Vital Signs Time Taken: 13:38 Temperature (F): 97.7 Height (in): 77 Pulse (bpm): 94 Source: Stated Respiratory Rate (breaths/min): 18 Weight (lbs): 240 Blood Pressure (mmHg): 124/84 Source: Stated Capillary Blood Glucose (mg/dl): 228 Body Mass Index (BMI): 28.5 Reference Range: 80 - 120 mg / dl Electronic Signature(s) Signed: 12/08/2022 3:57:27 PM By: Sharyn Creamer RN, BSN Entered By: Sharyn Creamer on 12/08/2022 13:43:11

## 2022-12-10 NOTE — Telephone Encounter (Signed)
Faxed order and demographics to [facility].   Called patient and LVM with update.  Talbot Grumbling, RN

## 2022-12-11 ENCOUNTER — Encounter (HOSPITAL_COMMUNITY): Payer: Self-pay

## 2022-12-14 ENCOUNTER — Ambulatory Visit: Payer: 59

## 2022-12-14 ENCOUNTER — Other Ambulatory Visit: Payer: Self-pay

## 2022-12-14 DIAGNOSIS — E0843 Diabetes mellitus due to underlying condition with diabetic autonomic (poly)neuropathy: Secondary | ICD-10-CM

## 2022-12-14 MED ORDER — ATORVASTATIN CALCIUM 40 MG PO TABS: 40.0000 mg | ORAL_TABLET | Freq: Every day | ORAL | 0 refills | Status: DC

## 2022-12-14 NOTE — Progress Notes (Signed)
Patient presents to the office today for diabetic shoe and insole measuring.  Patient was measured with brannock device to determine size and width for 1 pair of extra depth shoes and foam casted for 3 pair of insoles.   Documentation of medical necessity will be sent to patient's treating diabetic doctor to verify and sign.   Patient's diabetic provider: Jacolyn Reedy   Shoes and insoles will be ordered at that time and patient will be notified for an appointment for fitting when they arrive.   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

## 2022-12-15 ENCOUNTER — Encounter (HOSPITAL_BASED_OUTPATIENT_CLINIC_OR_DEPARTMENT_OTHER): Payer: 59 | Admitting: General Surgery

## 2022-12-15 DIAGNOSIS — E11621 Type 2 diabetes mellitus with foot ulcer: Secondary | ICD-10-CM | POA: Diagnosis not present

## 2022-12-15 NOTE — Progress Notes (Addendum)
Reduction in A rea: -349.20% N/A N/A % Reduction in Volume: 12 Starting Position 1 (o'clock): 12 Ending Position 1 (o'clock): 1.2 Maximum Distance 1 (cm): Yes N/A N/A Undermining: Grade 2 N/A N/A Classification: Medium N/A N/A Exudate A mount: Serosanguineous N/A N/A Exudate Type: red, brown N/A N/A Exudate Color: Well defined, not attached N/A N/A Wound Margin: Medium (34-66%) N/A N/A Granulation A mount: Pink, Pale N/A N/A Granulation Quality: Medium (34-66%) N/A N/A Necrotic A mount: Fat Layer (Subcutaneous Tissue): Yes N/A N/A Exposed Structures: Bone: Yes Fascia: No Tendon: No Muscle: No Joint: No None N/A N/A Epithelialization: Debridement - Excisional N/A N/A Debridement: Pre-procedure Verification/Time Out 11:15 N/A N/A Taken: Lidocaine 5% topical ointment N/A N/A Pain Control: Callus, Subcutaneous, Slough N/A N/A Tissue Debrided: Skin/Subcutaneous Tissue N/A N/A Level: 1 N/A N/A Debridement A (sq cm): rea Curette N/A N/A Instrument: Minimum N/A N/A Bleeding: Pressure N/A N/A Hemostasis A chieved: 0 N/A N/A Procedural Pain: 0 N/A N/A Post Procedural Pain: Procedure was tolerated well N/A N/A Debridement  Treatment Response: 1x1x0.8 N/A N/A Post Debridement Measurements [name] x W x D (cm) 0.628 N/A N/A Post Debridement Volume: (cm) Callus: Yes N/A N/A Periwound Skin Texture: No Abnormalities Noted N/A N/A Periwound Skin Moisture: No Abnormalities Noted N/A N/A Periwound Skin Color: Debridement N/A N/A Procedures Performed: Treatment Notes Electronic Signature(s) Signed: 12/15/2022 11:33:20 AM By: [name] Maudlin MD FACS Entered By: [name] [name] on 12/15/2022 11:33:20 -------------------------------------------------------------------------------- Multi-Disciplinary Care Plan Details Patient Name: Date of Service: [name] [name] ND, JA MIE [name]. 12/15/2022 10:30 A M Medical Record Number: [id number] Patient Account Number: [id number] Date of Birth/Sex: Treating RN: 02/01/1971 (52 y.o. [name] [name] Primary Care [name] [name]: [name] Other Clinician: Referring [name] [name]: Treating [name] [name]/Extender: [name] [name], [name] [name] in Treatment: 1 [name], [name] ([id number]) [id number] Page 4 of 7 Multidisciplinary Care Plan reviewed with physician Active Inactive Wound/Skin Impairment Nursing Diagnoses: Impaired tissue integrity Knowledge deficit related to ulceration/compromised skin integrity Goals: Patient/caregiver will verbalize understanding of skin care regimen Date Initiated: 12/08/2022 Target Resolution Date: 01/05/2023 Goal Status: Active Ulcer/skin breakdown will have a volume reduction of 30% by week 4 Date Initiated: 12/08/2022 Target Resolution Date: 01/05/2023 Goal Status: Active Interventions: Assess patient/caregiver ability to obtain necessary supplies Assess patient/caregiver ability to perform ulcer/skin care regimen upon admission and as needed Assess ulceration(s) every visit Provide education on smoking Provide education on ulcer and skin care Notes: Electronic Signature(s) Signed: 12/16/2022 5:16:51 PM By:  [name] Gouty RN, BSN Entered By: [name] [name] on 12/15/2022 11:13:50 -------------------------------------------------------------------------------- Pain Assessment Details Patient Name: Date of Service: [name] [name] ND, JA MIE [name]. 12/15/2022 10:30 A M Medical Record Number: [id number] Patient Account Number: [id number] Date of Birth/Sex: Treating RN: [birth year] (52 y.o. M) Primary Care Ova Gillentine: [name] Other Clinician: Referring [name]: Treating [name]/Extender: [name] [name], [name] [name] in Treatment: 1 Active Problems Location of Pain Severity and Description of Pain Patient Has Paino Yes Site Locations Pain Location: Pain in Ulcers With Dressing Change: Yes Duration of the Pain. Constant / Intermittento Constant Rate the pain. Current Pain Level: 6 Worst Pain Level: 10 Least Pain Level: 0 Tolerable Pain Level: 2 Character of Pain Describe the Pain: [name] [name], [name] [name] ([id number]) [phone].pdf Page 5 of 7 Pain Management and Medication Current Pain Management: Other: tolerable Is the Current Pain Management Adequate: Adequate How does your wound impact your activities of daily livingo Sleep: Yes Bathing: No Appetite: No Relationship With Others: No Bladder Continence: No Emotions: No Bowel Continence: No Work: No Toileting:  Reduction in A rea: -349.20% N/A N/A % Reduction in Volume: 12 Starting Position 1 (o'clock): 12 Ending Position 1 (o'clock): 1.2 Maximum Distance 1 (cm): Yes N/A N/A Undermining: Grade 2 N/A N/A Classification: Medium N/A N/A Exudate A mount: Serosanguineous N/A N/A Exudate Type: red, brown N/A N/A Exudate Color: Well defined, not attached N/A N/A Wound Margin: Medium (34-66%) N/A N/A Granulation A mount: Pink, Pale N/A N/A Granulation Quality: Medium (34-66%) N/A N/A Necrotic A mount: Fat Layer (Subcutaneous Tissue): Yes N/A N/A Exposed Structures: Bone: Yes Fascia: No Tendon: No Muscle: No Joint: No None N/A N/A Epithelialization: Debridement - Excisional N/A N/A Debridement: Pre-procedure Verification/Time Out 11:15 N/A N/A Taken: Lidocaine 5% topical ointment N/A N/A Pain Control: Callus, Subcutaneous, Slough N/A N/A Tissue Debrided: Skin/Subcutaneous Tissue N/A N/A Level: 1 N/A N/A Debridement A (sq cm): rea Curette N/A N/A Instrument: Minimum N/A N/A Bleeding: Pressure N/A N/A Hemostasis A chieved: 0 N/A N/A Procedural Pain: 0 N/A N/A Post Procedural Pain: Procedure was tolerated well N/A N/A Debridement  Treatment Response: 1x1x0.8 N/A N/A Post Debridement Measurements [name] x W x D (cm) 0.628 N/A N/A Post Debridement Volume: (cm) Callus: Yes N/A N/A Periwound Skin Texture: No Abnormalities Noted N/A N/A Periwound Skin Moisture: No Abnormalities Noted N/A N/A Periwound Skin Color: Debridement N/A N/A Procedures Performed: Treatment Notes Electronic Signature(s) Signed: 12/15/2022 11:33:20 AM By: [name] Maudlin MD FACS Entered By: [name] [name] on 12/15/2022 11:33:20 -------------------------------------------------------------------------------- Multi-Disciplinary Care Plan Details Patient Name: Date of Service: [name] [name] ND, JA MIE [name]. 12/15/2022 10:30 A M Medical Record Number: [id number] Patient Account Number: [id number] Date of Birth/Sex: Treating RN: 02/01/1971 (52 y.o. [name] [name] Primary Care [name] [name]: [name] Other Clinician: Referring [name] [name]: Treating [name] [name]/Extender: [name] [name], [name] [name] in Treatment: 1 [name], [name] ([id number]) [id number] Page 4 of 7 Multidisciplinary Care Plan reviewed with physician Active Inactive Wound/Skin Impairment Nursing Diagnoses: Impaired tissue integrity Knowledge deficit related to ulceration/compromised skin integrity Goals: Patient/caregiver will verbalize understanding of skin care regimen Date Initiated: 12/08/2022 Target Resolution Date: 01/05/2023 Goal Status: Active Ulcer/skin breakdown will have a volume reduction of 30% by week 4 Date Initiated: 12/08/2022 Target Resolution Date: 01/05/2023 Goal Status: Active Interventions: Assess patient/caregiver ability to obtain necessary supplies Assess patient/caregiver ability to perform ulcer/skin care regimen upon admission and as needed Assess ulceration(s) every visit Provide education on smoking Provide education on ulcer and skin care Notes: Electronic Signature(s) Signed: 12/16/2022 5:16:51 PM By:  [name] Gouty RN, BSN Entered By: [name] [name] on 12/15/2022 11:13:50 -------------------------------------------------------------------------------- Pain Assessment Details Patient Name: Date of Service: [name] [name] ND, JA MIE [name]. 12/15/2022 10:30 A M Medical Record Number: [id number] Patient Account Number: [id number] Date of Birth/Sex: Treating RN: [birth year] (52 y.o. M) Primary Care Ova Gillentine: [name] Other Clinician: Referring [name]: Treating [name]/Extender: [name] [name], [name] [name] in Treatment: 1 Active Problems Location of Pain Severity and Description of Pain Patient Has Paino Yes Site Locations Pain Location: Pain in Ulcers With Dressing Change: Yes Duration of the Pain. Constant / Intermittento Constant Rate the pain. Current Pain Level: 6 Worst Pain Level: 10 Least Pain Level: 0 Tolerable Pain Level: 2 Character of Pain Describe the Pain: [name] [name], [name] [name] ([id number]) [phone].pdf Page 5 of 7 Pain Management and Medication Current Pain Management: Other: tolerable Is the Current Pain Management Adequate: Adequate How does your wound impact your activities of daily livingo Sleep: Yes Bathing: No Appetite: No Relationship With Others: No Bladder Continence: No Emotions: No Bowel Continence: No Work: No Toileting:  No Drive: No Dressing: No Hobbies: No Electronic Signature(s) Signed: 12/16/2022 5:16:51 PM By: [name] Gouty RN, BSN Previous Signature: 12/15/2022 10:30:09 AM Version By: [name] [name] Entered By: [name] [name] on 12/15/2022 11:09:57 -------------------------------------------------------------------------------- Patient/Caregiver Education Details Patient Name: Date of Service: [name] [name] ND, Payton Doughty 1/23/2024andnbsp10:30 A M Medical Record Number: [id number] Patient Account Number: [id number] Date of Birth/Gender: Treating RN: 1971-01-28 (52 y.o. [name] [name] Primary  Care Physician: [name] Other Clinician: Referring Physician: Treating Physician/Extender: [name] [name], [name] [name] in Treatment: 1 Education Assessment Education Provided To: Patient Education Topics Provided Elevated Blood Sugar/ Impact on Healing: Methods: Explain/Verbal Responses: Reinforcements needed, State content correctly Offloading: Methods: Explain/Verbal Responses: Reinforcements needed, State content correctly Wound/Skin Impairment: Methods: Explain/Verbal Responses: Reinforcements needed, State content correctly Electronic Signature(s) Signed: 12/16/2022 5:16:51 PM By: [name] Gouty RN, BSN Entered By: [name] [name] on 12/15/2022 11:14:27 -------------------------------------------------------------------------------- Wound Assessment Details Patient Name: Date of Service: [name] [name] ND, JA MIE [name]. 12/15/2022 10:30 A M Medical Record Number: [id number] Patient Account Number: [id number] Date of Birth/Sex: Treating RN: 06-03-1971 (52 y.o. M) Primary Care [name] [name]: [name] Other Clinician: Eusebio [name] ([id number]) [id number] Page 6 of 7 Referring [name] [name]: Treating [name] [name]/Extender: [name] [name], [name] [name] in Treatment: 1 Wound Status Wound Number: 4 Primary Diabetic Wound/Ulcer of the Lower Extremity Etiology: Wound Location: Left, Medial Foot Wound Open Wounding Event: Gradually Appeared Status: Date Acquired: 10/07/2022 Comorbid Anemia, Hypertension, Peripheral Arterial Disease, Peripheral [name] Of Treatment: 1 History: Venous Disease, Type II Diabetes, Neuropathy Clustered Wound: No Photos Wound Measurements Length: (cm) 0.9 Width: (cm) 0.5 Depth: (cm) 0.8 Area: (cm) 0.353 Volume: (cm) 0.283 % Reduction in Area: 43.8% % Reduction in Volume: -349.2% Epithelialization: None Tunneling: No Undermining: Yes Starting Position (o'clock): 12 Ending Position (o'clock):  12 Maximum Distance: (cm) 1.2 Wound Description Classification: Grade 2 Wound Margin: Well defined, not attached Exudate Amount: Medium Exudate Type: Serosanguineous Exudate Color: red, brown Foul Odor After Cleansing: No Slough/Fibrino Yes Wound Bed Granulation Amount: Medium (34-66%) Exposed Structure Granulation Quality: Pink, Pale Fascia Exposed: No Necrotic Amount: Medium (34-66%) Fat Layer (Subcutaneous Tissue) Exposed: Yes Necrotic Quality: Adherent Slough Tendon Exposed: No Muscle Exposed: No Joint Exposed: No Bone Exposed: Yes Periwound Skin Texture Texture Color No Abnormalities Noted: No No Abnormalities Noted: Yes Callus: Yes Moisture No Abnormalities Noted: Yes Treatment Notes Wound #4 (Foot) Wound Laterality: Left, Medial Cleanser Wound Cleanser Discharge Instruction: Cleanse the wound with wound cleanser prior to applying a clean dressing using gauze sponges, not tissue or cotton balls. Peri-Wound Care Topical Primary Dressing Hydrofera Blue Classic Foam Rope Dressing, 9x6 (mm/in) Discharge Instruction: Moisten with saline prior to packing Loschiavo, [name] [name] ([id number]) [phone].pdf Page 7 of 7 Secondary Dressing Optifoam Non-Adhesive Dressing, 4x4 in Discharge Instruction: Apply over primary dressing as directed. Woven Gauze Sponge, Non-Sterile 4x4 in Discharge Instruction: Apply over primary dressing as directed. Secured With [employer], 4.5x3.1 (in/yd) Discharge Instruction: Secure with Kerlix as directed. 8M Medipore H Soft Cloth Surgical T ape, 4 x 10 (in/yd) Discharge Instruction: Secure with tape as directed. Compression Wrap Compression Stockings Add-Ons Electronic Signature(s) Signed: 12/16/2022 5:16:51 PM By: [name] Gouty RN, BSN Previous Signature: 12/15/2022 10:30:09 AM Version By: [name] [name] Entered By: [name] [name] on 12/15/2022  11:13:25 -------------------------------------------------------------------------------- Vitals Details Patient Name: Date of Service: [name] [name] ND, JA MIE [name]. 12/15/2022 10:30 A M Medical Record Number: [id number] Patient Account Number: [id number] Date of Birth/Sex: Treating RN: 08/01/1971 (52 y.o. M) Primary Care [name] [name]: [name] Other Clinician: Referring Arianna Delsanto:  No Drive: No Dressing: No Hobbies: No Electronic Signature(s) Signed: 12/16/2022 5:16:51 PM By: [name] Gouty RN, BSN Previous Signature: 12/15/2022 10:30:09 AM Version By: [name] [name] Entered By: [name] [name] on 12/15/2022 11:09:57 -------------------------------------------------------------------------------- Patient/Caregiver Education Details Patient Name: Date of Service: [name] [name] ND, Payton Doughty 1/23/2024andnbsp10:30 A M Medical Record Number: [id number] Patient Account Number: [id number] Date of Birth/Gender: Treating RN: 1971-01-28 (52 y.o. [name] [name] Primary  Care Physician: [name] Other Clinician: Referring Physician: Treating Physician/Extender: [name] [name], [name] [name] in Treatment: 1 Education Assessment Education Provided To: Patient Education Topics Provided Elevated Blood Sugar/ Impact on Healing: Methods: Explain/Verbal Responses: Reinforcements needed, State content correctly Offloading: Methods: Explain/Verbal Responses: Reinforcements needed, State content correctly Wound/Skin Impairment: Methods: Explain/Verbal Responses: Reinforcements needed, State content correctly Electronic Signature(s) Signed: 12/16/2022 5:16:51 PM By: [name] Gouty RN, BSN Entered By: [name] [name] on 12/15/2022 11:14:27 -------------------------------------------------------------------------------- Wound Assessment Details Patient Name: Date of Service: [name] [name] ND, JA MIE [name]. 12/15/2022 10:30 A M Medical Record Number: [id number] Patient Account Number: [id number] Date of Birth/Sex: Treating RN: 06-03-1971 (52 y.o. M) Primary Care [name] [name]: [name] Other Clinician: Eusebio [name] ([id number]) [id number] Page 6 of 7 Referring [name] [name]: Treating [name] [name]/Extender: [name] [name], [name] [name] in Treatment: 1 Wound Status Wound Number: 4 Primary Diabetic Wound/Ulcer of the Lower Extremity Etiology: Wound Location: Left, Medial Foot Wound Open Wounding Event: Gradually Appeared Status: Date Acquired: 10/07/2022 Comorbid Anemia, Hypertension, Peripheral Arterial Disease, Peripheral [name] Of Treatment: 1 History: Venous Disease, Type II Diabetes, Neuropathy Clustered Wound: No Photos Wound Measurements Length: (cm) 0.9 Width: (cm) 0.5 Depth: (cm) 0.8 Area: (cm) 0.353 Volume: (cm) 0.283 % Reduction in Area: 43.8% % Reduction in Volume: -349.2% Epithelialization: None Tunneling: No Undermining: Yes Starting Position (o'clock): 12 Ending Position (o'clock):  12 Maximum Distance: (cm) 1.2 Wound Description Classification: Grade 2 Wound Margin: Well defined, not attached Exudate Amount: Medium Exudate Type: Serosanguineous Exudate Color: red, brown Foul Odor After Cleansing: No Slough/Fibrino Yes Wound Bed Granulation Amount: Medium (34-66%) Exposed Structure Granulation Quality: Pink, Pale Fascia Exposed: No Necrotic Amount: Medium (34-66%) Fat Layer (Subcutaneous Tissue) Exposed: Yes Necrotic Quality: Adherent Slough Tendon Exposed: No Muscle Exposed: No Joint Exposed: No Bone Exposed: Yes Periwound Skin Texture Texture Color No Abnormalities Noted: No No Abnormalities Noted: Yes Callus: Yes Moisture No Abnormalities Noted: Yes Treatment Notes Wound #4 (Foot) Wound Laterality: Left, Medial Cleanser Wound Cleanser Discharge Instruction: Cleanse the wound with wound cleanser prior to applying a clean dressing using gauze sponges, not tissue or cotton balls. Peri-Wound Care Topical Primary Dressing Hydrofera Blue Classic Foam Rope Dressing, 9x6 (mm/in) Discharge Instruction: Moisten with saline prior to packing Loschiavo, [name] [name] ([id number]) [phone].pdf Page 7 of 7 Secondary Dressing Optifoam Non-Adhesive Dressing, 4x4 in Discharge Instruction: Apply over primary dressing as directed. Woven Gauze Sponge, Non-Sterile 4x4 in Discharge Instruction: Apply over primary dressing as directed. Secured With [employer], 4.5x3.1 (in/yd) Discharge Instruction: Secure with Kerlix as directed. 8M Medipore H Soft Cloth Surgical T ape, 4 x 10 (in/yd) Discharge Instruction: Secure with tape as directed. Compression Wrap Compression Stockings Add-Ons Electronic Signature(s) Signed: 12/16/2022 5:16:51 PM By: [name] Gouty RN, BSN Previous Signature: 12/15/2022 10:30:09 AM Version By: [name] [name] Entered By: [name] [name] on 12/15/2022  11:13:25 -------------------------------------------------------------------------------- Vitals Details Patient Name: Date of Service: [name] [name] ND, JA MIE [name]. 12/15/2022 10:30 A M Medical Record Number: [id number] Patient Account Number: [id number] Date of Birth/Sex: Treating RN: 08/01/1971 (52 y.o. M) Primary Care [name] [name]: [name] Other Clinician: Referring Arianna Delsanto:

## 2022-12-16 ENCOUNTER — Telehealth: Payer: Self-pay

## 2022-12-16 NOTE — Progress Notes (Signed)
[name] [name] ([id number]) [phone].pdf Page 1 of 14 Visit Report for 12/15/2022 Chief Complaint Document Details Patient Name: Date of Service: [name] [name] 12/15/2022 10:30 A M Medical Record Number: [id number] Patient Account Number: [id number] Date of Birth/Sex: Treating RN: 1970-12-22 (52 y.o. M) Primary Care Provider: [name] Other Clinician: Referring Provider: Treating Provider/Extender: Jeananne Rama, Mayuri Weeks in Treatment: 1 Information Obtained from: Patient Chief Complaint patient is here for a review of the wound on his plantar left first metatarsal head and back of his right leg 06/22/2022; patient presents for review of callus on the plantar aspect of the left first met head 12/08/2022: callus left 1st met head, now with deep ulcer underlying Electronic Signature(s) Signed: 12/15/2022 11:33:29 AM By: Fredirick Maudlin MD FACS Entered By: Fredirick Maudlin on 12/15/2022 11:33:29 -------------------------------------------------------------------------------- Debridement Details Patient Name: Date of Service: [name] [name], [name] 12/15/2022 10:30 A M Medical Record Number: [id number] Patient Account Number: [id number] Date of Birth/Sex: Treating RN: Jul 30, 1971 (52 y.o. [name] Primary Care Provider: [name] Other Clinician: Referring Provider: Treating Provider/Extender: Jeananne Rama, Mayuri Weeks in Treatment: 1 Debridement Performed for Assessment: Wound #4 Left,Medial Foot Performed By: Physician Fredirick Maudlin, MD Debridement Type: Debridement Severity of Tissue Pre Debridement: Bone involvement without necrosis Level of Consciousness (Pre-procedure): Awake and Alert Pre-procedure Verification/Time Out Yes - 11:15 Taken: Start Time: 11:17 Pain Control: Lidocaine 5% topical ointment T Area Debrided (L x W): otal 1 (cm) x 1 (cm) = 1 (cm) Tissue and other material  debrided: Viable, Non-Viable, Callus, Slough, Subcutaneous, Skin: Epidermis, Slough Level: Skin/Subcutaneous Tissue Debridement Description: Excisional Instrument: Curette Bleeding: Minimum Hemostasis Achieved: Pressure Procedural Pain: 0 Post Procedural Pain: 0 Response to Treatment: Procedure was tolerated well Level of Consciousness (Post- Awake and Alert procedure): Post Debridement Measurements of Total Wound Length: (cm) 1 Width: (cm) 1 Depth: (cm) 0.8 Volume: (cm) 0.628 Character of Wound/Ulcer Post Debridement: Improved [name], [name] L ([id number]) [id number] Page 2 of 14 Severity of Tissue Post Debridement: Bone involvement without necrosis Post Procedure Diagnosis Same as Pre-procedure Notes scribed by Baruch Gouty, RN for Dr. Celine Ahr Electronic Signature(s) Signed: 12/15/2022 12:41:20 PM By: Fredirick Maudlin MD FACS Signed: 12/16/2022 5:16:51 PM By: Baruch Gouty RN, BSN Entered By: Baruch Gouty on 12/15/2022 11:56:11 -------------------------------------------------------------------------------- HPI Details Patient Name: Date of Service: [name] [name], [name] 12/15/2022 10:30 A M Medical Record Number: [id number] Patient Account Number: [id number] Date of Birth/Sex: Treating RN: 10-Jul-1971 (52 y.o. M) Primary Care Provider: [name] Other Clinician: Referring Provider: Treating Provider/Extender: Jeananne Rama, Mayuri Weeks in Treatment: 1 History of Present Illness HPI Description: 04/21/18 ADMISSION This is a 52 year old man who is a type II diabetic. In spite of fact his hemoglobin A1c is actually quite good 5.73 months ago he is at a really difficult time over the last 6-7 months. He developed a rapidly progressive infection in the right foot in November 2018 associated with osteomyelitis and necrotizing fasciitis. 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 The stump required and a revision on 11/24/17. The  stump revision was advertised as being secondary to falls. I'm not sure the progressive history here however this area is actually closed over. The patient tells me over the same timeframe he has had wounds on the plantar aspect of his left foot. In the ED saw Dr. Sharol Given in April of this year. 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 It is  really not clear that this patient is been dressing this with anything. He came in with the clinic without any specific dressing on the wound areas using his own tennis shoe. He tells me that he only ambulates of course to do a pivot transfer. He does not have his prosthesis for the right leg as of yet and he blames Medicaid for this. He does however use the foot to push himself along in his wheelchair at home. The patient has not had formal arterial studies. ABI in our clinic on the left was 1.13. Patient's past medical history includes type 2 diabetes, fracture of the right fibula. I see that he was treated for abscesses on his right buttock and chest in 2016. I did not look at the microbiology of this. 04/28/18; patient comes back in the clinic today with the wound pretty much the same as when he came in here last week. Small opening lots of undermining relatively. He tells [place] that nothing is really been on this for 3 days in spite of the fact that we gave him enough to dress this easily especially such a small wound. He says he lives with his mother, she is not capable of assisting with this he is changing the dressings himself. 05/05/18; much better-looking wound today. Smaller. There is some undermining medially however that's only perhaps 2 mm. No surrounding erythema. We've been using silver collagen 05/12/18; small wound on the first metatarsal head. No undermining no surrounding erythema. We've been using silver collagen he has home health coming out to change 05/20/18 the wound on the first metatarsal head looks  better. Covered in surface debris/callus nonviable tissue. Required debridement but post debridement this looks quite good. We've been using silver collagen. He has home health 05/27/18; first metatarsal head wound continues to improve. Just about completely closed. Still a lot of surface debridement callus. We've been using silver collagen 06/06/18; the first metatarsal wound is completely healed over. There is still a lot of callus. I gently removed some of this just to make sure that there was no open area and there is not. The patient mentions to me that his left leg has felt like "lead" for about a week READMISSION 08/16/2020 This is a 52 year old man with type 2 diabetes. He returns to clinic today with a 1 month history of a blister and callus over the first metatarsal head. This is in exactly the same area as when he was here in 2019. 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 He has standard running shoes on the left foot to match the area in his prosthesis. He has only been covering this area with a Band-Aid has not really been specifically dressing this. ABI in our clinic on the left was 1.16. This is essentially stable from the value in 2019 10/1 the patient has a linear wound over the left first metatarsal head. Again not a lot of callus thick skin around this that I removed with a #3 curette. This is not go deep to bone or muscle it does not look to be infected. 10/8 first metatarsal head. The wound looks like it is closing however this is going to be a difficult area to offload in the future. He has a size 15 shoe and he says his shoes are years old. The inserts in these current shoes are totally useless and I have advised him to replace these. He may need a new  pair of shoes and I have he got original prosthesis at [facility] [place] [facility] [place] I have asked him to go back there. 11/9; the patient has not been here in more than a month. Not sure he  was healed when he was here the last time. I told him he would have to get new shoes and certainly offload the area over the left first metatarsal head. He has done nothing of the kind. He arrives back in clinic with the same old new balance sneakers. A very large separating callus over the first metatarsal head on the left 11/16; he has purchased new shoes and has at least 2 insoles like I asked. The wound is measuring much smaller. He is using silver alginate he changes the dressing himself 11/30; wound is measurably smaller in length of about a half a centimeter. This is generally very little depth. We have been using silver alginate. He changes ZEPH, RIEBEL L ([id number]) [phone].pdf Page 3 of 14 the dressing himself every second day. 12/14; wound is about the same size. Significant undermining medially relative to the size of the wound. We have been using silver alginate. There is not a way to offload this area as he walks with a prosthesis on the right leg 12/28; wound is about the same size. Again he has undermining medially. I am using polymen on this 12/02/2020; the wound looks smaller. Still a senescent edge. We have been using polymen 1/24; the wound is come down a few millimeters. Still a senescent edge. I have been using polymen 2/8; the wound is come down slightly. Still with slight undermining from 12-6 o'clock I have been using polymen partially to get offloading on this area which is opposed by his prosthesis on the other leg. 2/22 quite open improvement he still has a comma shaped wound on the left first metatarsal head. Some depth. Using polymen 3/14; he still has the same problem of a comma shaped area over the left first metatarsal head. This seems to have had more depth today at 0.6 cm. We have been using polymen. The patient changes this himself every second day. I explored the idea of a total contact cast on the left leg however this is his  driving foot. He was not enamored with the idea of not being able to drive and states that he does not have anybody else to get him around 3/28; again he comes in with a smaller looking wound however there is depth and undermining requiring debridement. We have been using Hydrofera Blue, changed to silver collagen today. The patient is doing the dressing himself 4/11; patient presents today for follow-up of his left diabetic foot wound. He denies any issues in the past 2 weeks. He is currently using silver collagen every other day with dressing changes. He does not use diabetic shoes or special inserts to his sneakers. He does not use felt pads for offloading. 4/19; patient presents for his 1 week follow-up. He denies any issues. He reports using Hydrofera Blue and has not been using silver collagen for dressing changes. He reports minimal drainage to the wound. He overall feels well. 5/3; patient presents for 2-week follow-up. He has been using silver collagen without issues. He has no complaints today and states he overall feels well. 5/17; patient presents for 2-week follow-up. He states that the sleeve is rubbing up against the back of his right leg and causing a wound. He states this happened a week ago and has not been dressing  the wound. The plantar wound is stable and he has been using collagen every other day. 5/24; Patient was had a recent hospitalization for cellulitis of his right popliteal fossa. He was admitted and started on IV antibiotics. He does not recall the plantar wound dressed while inpatient. He feels a lot better today. He was discharged on 5/21 on oral antibiotics and has not picked this up until today. He reports using collagen to the plantar wound since discharge. He is keeping the right posterior knee wound covered. He is getting a new sleeve for his prostatic on 5/26. He currently denies signs of infection. 6/1; patient presents for 1 week follow-up. He is still on  doxycycline. He has not been dressing the back of the right knee wound. He states he is receiving his new prosthetic sleeve tomorrow. He states he is using silver alginate to the plantar foot wound. He currently denies signs of infection. He states he has not been offloading the left foot wound 6/7; patient presents for 1 week follow-up. He completes his last dose of doxycycline today. He finally received his prosthetic sleeve for the right side and he states it feels much better. He has been using antibiotic ointment to the back of that right knee wound. He has been using silver alginate to the plantar foot wound. He currently denies signs of infection. 6/21; patient presents for 2-week follow-up. He reports no issues with his prosthetic sleeve. He has been using antibiotic ointment to the back of the right knee. He has been using silver alginate to the plantar foot wound. He has no issues or complaints today. He denies signs of infection. 7/5; patient presents for 2-week follow-up. He reports no issues or complaints today. He continues to use antibiotic ointment to the right posterior knee wound and silver alginate to the left plantar foot wound. He denies signs of infection. 7/19; patient presents for 2-week follow-up. He continues to use antibiotic ointment to the right posterior knee and silver alginate to the left plantar foot wound. He has no issues or complaints today. He denies signs of infection. 8/4; patient presents for 2-week follow-up. He has no issues or complaints today. He denies signs of infection. 8/18; patient presents for 2-week follow-up. He has no issues or concerns today. He has been approved for Apligraf and he would like to have this placed today. He denies signs of infection and reports that his right posterior knee wound is closed. 8/25; patient presents for 1 week follow-up. He had no issues with his first Apligraf placement last week. He did however develop an abscess to  the back of his right knee. He reports tenderness to this area. He denies systemic signs of infection. 9/1; patient presents for 1 week follow-up. He has had no issues with his plantar foot wound. The Apligraf has stayed in place over the past week. A wound culture was done at last clinic visit and a new antibiotic was sent to the pharmacy. Patient has not received this yet. He reports some mild tenderness to the back of his right knee. He denies systemic signs of infection. 9/8; patient presents for 1 week follow-up. He states he took Keflex and had diarrhea as a side effect. He was able to complete the course. He reports continued tenderness to the right posterior knee wound. He has some drainage still. He denies increased warmth or erythema to the area. He denies fever/chills or nausea/vomiting. He has no issues or complaints today about his plantar foot  wound. 9/15; patient presents for follow-up. He reports improvement in pain to the back of his right leg. He does not report any drainage. He had an ultrasound completed on 9/9 that showed a superficial fluid collection concerning for abscess. We attempted to call patient with results with no answer. We did put an urgent referral to general surgery and he reports he has not received a call. He currently denies systemic signs of infection. He reports no issues to his left plantar foot wound. 9/27; patient presents for follow-up. He has no issues or complaints today. He has had no issues with the previous wound to the back of his right leg. He has not made an appointment to see general surgery. He had Apligraf placed at last clinic visit to the plantar foot wound. He currently denies signs of infection. 10/7; patient presents for follow-up. Upon entering the room there is vomit throughout the entire floor. Patient states he does not feel well. I recommended he go to urgent care. I recommended following up next week for wound care. 10/10; patient  presents for follow-up. He came last week for his follow-up however he was ill and started vomiting in the exam room. Today he states he feels better however he continues to have vomiting episodes. He states he attempted to go to urgent care/the ED but could not find a parking spot. He subsequently went home. He has no issues or complaints today regarding the foot wound. He never followed up with general surgery for the abscess identified on ultrasound to the back of his right knee. 10/20; patient presents for follow-up. He has no issues or complaints today. Overall he feels well today. 10/27; patient presents for follow-up. He has no issues or complaints today. 11/3; patient presents for follow-up. He has no issues or complaints today. He states he does not want to follow-up with orthopedics to reevaluate the abscess in the back of his right leg. He denies signs of infection. 11/15; patient presents for follow-up. He has no issues or complaints today. He had been using silver alginate to the left first met head wound. He denies signs of infection. MATEI, MAGNONE ([id number]) [phone].pdf Page 4 of 14 11/22; patient presents for follow-up. He had Affinity placed at last clinic visit. He has no issues or complaints today. 11/29; patient presents for follow-up. He is in good spirits today. He has no issues or complaints today. 12/6; patient presents for follow-up. He has no issues or complaints today. 12/13; patient presents for follow-up. He denies signs of infection. He has no issues or complaints today. 12/20; patient presents for follow-up. He has no issues or complaints today. He denies signs of infection. 12/29; patient presents for follow-up. He states that a few days ago he has had reaccumulation of an abscess behind the right knee. He denies increased warmth or erythema to the area. He denies fever/chills or drainage. 1/6; patient presents for follow-up. He  states he went to the [facility] emergency department to be evaluated for the abscess to his right posterior knee. He was started on clindamycin. They were unable to drain the abscess. Patient states that eventually the abscess ruptured. He reports improvement in his symptoms. He has no issues or complaints about his left foot wound. 1/17; patient presents for follow-up. He has no issues or complaints today. 1/24; patient presents for follow-up. He has no issues or complaints today. He denies signs of infection. 1/31; patient presents for follow-up. He has been approved for Affinity however  has a 20% co-pay. Patient has declined further Affinity treatments due to this. He currently denies signs of infection. 2/7; patient presents for follow-up. He has no issues or complaints today. He denies signs of infection. He has been doing Hydrofera Blue dressing changes. 2/14; patient presents for follow-up. He has no issues or complaints today. He has been using blast X with collagen to the wound bed. He currently denies any issues. 2/28; patient presents for follow-up. He denies signs of infection. He reports using blast X with collagen to the wound bed. 3/7; patient presents for follow-up. He states he has been using gentamicin ointment and urea cream with dressing changes. He reports that the back of his right knee has developed an abscess again and is draining. He states this started today. 02/03/2022: The patient came to clinic today without any dressing on his foot. He says that he took a shower yesterday and felt like he did not need a dressing and so he did not apply it. He is not offloading this foot and is wearing a regular athletic shoe. 3/21; patient presents for follow-up. He did not come in with a dressing on his wound. He states he has been using gentamicin ointment daily. He has a Pegasys insert to his tennis shoe for offloading. He currently denies signs of infection. 3/28; patient presents  for follow-up. He reports using gentamicin ointment to the wound bed. He has no issues or complaints today. 5/30/2023consult Patient presents because he states he tried to remove the callus on his plantar left foot and thinks he created a wound. He has been keeping the area covered. He denies drainage. 06/22/2022 Patient presents for review of callus to the plantar left foot. He denies any drainage or open wounds to the area. READMISSION 12/08/2022 He returns with the same large callus on the first metatarsal head as has been present at prior visits. There is drainage and malodor emanating from the site, although no obvious tissue defect is initially appreciated. His most recent hemoglobin A1c was in October 2023 and was 7.2. His renal function has deteriorated to the point where he needs to be considering dialysis and actually had a consultation with vascular surgery for access creation, but he has not yet decided whether or not he wishes to go through with it. 12/15/2022: The x-ray that was obtained last week showed a fracture of his distal phalanx of the great toe but did not show any evidence of osteomyelitis. He does report that the wound has had an odor over the past week. His culture only came back today and does show pathogenic levels of Prevotella, along with lower levels of other skin flora. Electronic Signature(s) Signed: 12/15/2022 11:35:21 AM By: Fredirick Maudlin MD FACS Entered By: Fredirick Maudlin on 12/15/2022 11:35:21 -------------------------------------------------------------------------------- Physical Exam Details Patient Name: Date of Service: STRICKLA [name], [name] 12/15/2022 10:30 A M Medical Record Number: [id number] Patient Account Number: [id number] Date of Birth/Sex: Treating RN: 1971-08-11 (52 y.o. M) Primary Care Provider: [name] Other Clinician: Referring Provider: Treating Provider/Extender: Jeananne Rama, Mayuri Weeks in Treatment:  1 Constitutional . . . . no acute distress. Respiratory NARCISO, [place] ([id number]) [phone].pdf Page 5 of 14 Normal work of breathing on room air. Notes 12/15/2022: No real improvement this week. There is buildup of nonviable tissue and the bone is visible in the depths. There is an odor coming from the wound. Electronic Signature(s) Signed: 12/15/2022 11:36:50 AM By: Fredirick Maudlin MD FACS Entered By: Fredirick Maudlin  on 12/15/2022 11:36:50 -------------------------------------------------------------------------------- Physician Orders Details Patient Name: Date of Service: [name] [name] 12/15/2022 10:30 A M Medical Record Number: [id number] Patient Account Number: [id number] Date of Birth/Sex: Treating RN: [birth year] (52 y.o. 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 - Dr, Celine Ahr RM 3 Return A Tuesday 1/30 1:15 pm Anesthetic Wound #4 Left,Medial Foot (In clinic) Topical Lidocaine 5% applied to wound bed Bathing/ Shower/ Hygiene May shower with protection but do not get wound dressing(s) wet. Protect dressing(s) with water repellant cover (for example, large plastic bag) or a cast cover and may then take shower. Off-Loading Other: - diabetic inserts for shoes Home Health Wound #4 Left,Medial Foot Admit to [place] for skilled  nursing wound care. May utilize formulary equivalent dressing for wound treatment orders unless otherwise specified. New wound care orders this week; continue Home Health for wound care. May utilize formulary equivalent dressing for wound treatment orders unless otherwise specified. Dressing changes to be completed by [place] on Tuesday / Thursday / Saturday except when patient has scheduled [facility] at [facility]. Wound Treatment Wound #4 - Foot Wound Laterality: Left, Medial Cleanser: Wound Cleanser (Generic) 1 x Per Day/10 Days Discharge Instructions: Cleanse the wound with wound cleanser prior to applying a clean dressing using gauze sponges, not tissue or cotton balls. Prim Dressing: Hydrofera Blue Classic Foam Rope Dressing, 9x6 (mm/in) (Generic) 1 x Per Day/10 Days ary Discharge Instructions: Moisten with saline prior to packing Secondary Dressing: Optifoam Non-Adhesive Dressing, 4x4 in (Generic) 1 x Per Day/10 Days Discharge Instructions: Apply over primary dressing as directed. Secondary Dressing: Woven Gauze Sponge, Non-Sterile 4x4 in (Generic) 1 x Per Day/10 Days CHON, [facility] ([id number]) [phone].pdf Page 6 of 14 Discharge Instructions: Apply over primary dressing as directed. Secured With: [employer], 4.5x3.1 (in/yd) (Generic) 1 x Per Day/10 Days Discharge Instructions: Secure with Kerlix as directed. Secured With: 109M Medipore H Soft Cloth Surgical T ape, 4 x 10 (in/yd) (Generic) 1 x Per Day/10 Days Discharge Instructions: Secure with tape as directed. Patient Medications llergies: adhesive tape, Fer-Iron A Notifications Medication Indication Start End 12/15/2022 Augmentin DOSE oral 500 mg-125 mg tablet - 1 tab p.o. daily x 10 days Electronic Signature(s) Signed: 12/15/2022 11:42:07 AM By: Fredirick Maudlin MD FACS Previous Signature: 12/15/2022 11:37:50 AM Version By: Fredirick Maudlin MD FACS Entered By: Fredirick Maudlin on  12/15/2022 11:42:07 -------------------------------------------------------------------------------- Problem List Details Patient Name: Date of Service: [name] [name], [name] 12/15/2022 10:30 A M Medical Record Number: [id number] Patient Account Number: [id number] Date of Birth/Sex: Treating RN: 03-17-1971 (52 y.o. 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 12/08/2022 No Yes without evidence of necrosis E11.621 Type 2 diabetes mellitus with foot ulcer 12/08/2022 No Yes N18.5 Chronic kidney disease, stage 5 12/08/2022 No Yes F17.200 Nicotine  dependence, unspecified, uncomplicated 6/75/9163 No Yes E11.65 Type 2 diabetes mellitus with hyperglycemia 12/08/2022 No Yes Z89.511 Acquired absence of right leg below knee 12/08/2022 No Yes Inactive Problems Resolved Problems ORDELL, PRICHETT ([id number]) [phone].pdf Page 7 of 14 Electronic Signature(s) Signed: 12/15/2022 11:33:13 AM By: Fredirick Maudlin MD FACS Entered By: Fredirick Maudlin on 12/15/2022 11:33:13 -------------------------------------------------------------------------------- Progress Note Details Patient Name: Date of Service: STRICKLA [name], [name] 12/15/2022 10:30 A M Medical Record Number: [id number] Patient Account Number: [id number] Date of Birth/Sex: Treating RN: 1971/01/14 (52 y.o. M) Primary Care Provider: [name] Other Clinician: Referring Provider: Treating Provider/Extender: Jeananne Rama, Mayuri Weeks in Treatment: 1 Subjective Chief Complaint Information obtained from Patient patient is here for a review of the wound on his plantar left first metatarsal head and back of  his right leg 06/22/2022; patient presents for review of callus on the plantar aspect of the left first met head 12/08/2022: callus left 1st met head, now with deep ulcer underlying History of Present Illness (HPI) 04/21/18 ADMISSION This is a 52 year old man who is a type II diabetic. In spite of fact his hemoglobin A1c is actually quite good 5.73 months ago he is at a really difficult time over the last 6-7 months. He developed a rapidly progressive infection in the right foot in November 2018 associated with osteomyelitis and necrotizing fasciitis. 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 The stump required and a revision on 11/24/17. The stump revision was advertised as being secondary to falls. I'm not sure the progressive history here however this area is actually closed over. The patient tells me over the same timeframe he has had wounds on the plantar aspect of his left foot. In the ED saw Dr. Sharol Given in April of this year. 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 It is really not clear that this patient is been dressing this with anything. He came in with the clinic without any specific dressing on the wound areas using his own tennis shoe. He tells me that he only ambulates of course to do a pivot transfer. He does not have his prosthesis for the right leg as of yet and he blames Medicaid for this. He does however use the foot to push himself along in his wheelchair at home. The patient has not had formal arterial studies. ABI in our clinic on the left was 1.13. Patient's past medical history includes type 2 diabetes, fracture of the right fibula. I see that he was treated for abscesses on his right buttock and chest in 2016. I did not look at the microbiology of this. 04/28/18; patient comes back in the clinic today with the wound pretty much the same as when he came in here last week. Small opening lots of undermining relatively. He tells [place] that  nothing is really been on this for 3 days in spite of the fact that we gave him enough to dress this easily especially such a small wound. He says he lives with his mother, she is not capable of assisting with this he is changing the dressings himself. 05/05/18; much better-looking wound today. Smaller. There is some undermining medially however that's only perhaps 2 mm. No surrounding erythema. We've been using silver collagen 05/12/18; small wound on the first metatarsal head. No undermining no surrounding erythema. We've been using silver collagen he has home health coming out to change 05/20/18 the wound on the first metatarsal head looks better.  Covered in surface debris/callus nonviable tissue. Required debridement but post debridement this looks quite good. We've been using silver collagen. He has home health 05/27/18; first metatarsal head wound continues to improve. Just about completely closed. Still a lot of surface debridement callus. We've been using silver collagen 06/06/18; the first metatarsal wound is completely healed over. There is still a lot of callus. I gently removed some of this just to make sure that there was no open area and there is not. The patient mentions to me that his left leg has felt like "lead" for about a week READMISSION 08/16/2020 This is a 52 year old man with type 2 diabetes. He returns to clinic today with a 1 month history of a blister and callus over the first metatarsal head. This is in exactly the same area as when he was here in 2019. 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 He has standard running shoes on the left foot to match the area in his prosthesis. He has only been covering this area with a Band-Aid has not really been specifically dressing this. ABI in our clinic on the left was 1.16. This is essentially stable from the value in 2019 10/1 the patient has a linear wound over the left first metatarsal  head. Again not a lot of callus thick skin around this that I removed with a #3 curette. This is not go deep to bone or muscle it does not look to be infected. 10/8 first metatarsal head. The wound looks like it is closing however this is going to be a difficult area to offload in the future. He has a size 15 shoe and he says his shoes are years old. The inserts in these current shoes are totally useless and I have advised him to replace these. He may need a new pair of shoes and I have he got original prosthesis at [facility] [place] I have asked him to go back there. 11/9; the patient has not been here in more than a month. Not sure he was healed when he was here the last time. I told him he would have to get new shoes and certainly offload the area over the left first metatarsal head. He has done nothing of the kind. He arrives back in clinic with the same old new balance sneakers. A very large separating callus over the first metatarsal head on the left 11/16; he has purchased new shoes and has at least 2 insoles like I asked. The wound is measuring much smaller. He is using silver alginate he changes the dressing himself 11/30; wound is measurably smaller in length of about a half a centimeter. This is generally very little depth. We have been using silver alginate. He changes the dressing himself every second day. 12/14; wound is about the same size. Significant undermining medially relative to the size of the wound. We have been using silver alginate. There is not a way to offload this area as he walks with a prosthesis on the right leg 12/28; wound is about the same size. Again he has undermining medially. I am using polymen on this ASBERRY, LASCOLA ([id number]) [phone].pdf Page 8 of 14 12/02/2020; the wound looks smaller. Still a senescent edge. We have been using polymen 1/24; the wound is come down a few millimeters. Still a senescent edge. I have been using  polymen 2/8; the wound is come down slightly. Still with slight undermining  from 12-6 o'clock I have been using polymen partially to get offloading on this area which is opposed by his prosthesis on the other leg. 2/22 quite open improvement he still has a comma shaped wound on the left first metatarsal head. Some depth. Using polymen 3/14; he still has the same problem of a comma shaped area over the left first metatarsal head. This seems to have had more depth today at 0.6 cm. We have been using polymen. The patient changes this himself every second day. I explored the idea of a total contact cast on the left leg however this is his driving foot. He was not enamored with the idea of not being able to drive and states that he does not have anybody else to get him around 3/28; again he comes in with a smaller looking wound however there is depth and undermining requiring debridement. We have been using Hydrofera Blue, changed to silver collagen today. The patient is doing the dressing himself 4/11; patient presents today for follow-up of his left diabetic foot wound. He denies any issues in the past 2 weeks. He is currently using silver collagen every other day with dressing changes. He does not use diabetic shoes or special inserts to his sneakers. He does not use felt pads for offloading. 4/19; patient presents for his 1 week follow-up. He denies any issues. He reports using Hydrofera Blue and has not been using silver collagen for dressing changes. He reports minimal drainage to the wound. He overall feels well. 5/3; patient presents for 2-week follow-up. He has been using silver collagen without issues. He has no complaints today and states he overall feels well. 5/17; patient presents for 2-week follow-up. He states that the sleeve is rubbing up against the back of his right leg and causing a wound. He states this happened a week ago and has not been dressing the wound. The plantar wound is  stable and he has been using collagen every other day. 5/24; Patient was had a recent hospitalization for cellulitis of his right popliteal fossa. He was admitted and started on IV antibiotics. He does not recall the plantar wound dressed while inpatient. He feels a lot better today. He was discharged on 5/21 on oral antibiotics and has not picked this up until today. He reports using collagen to the plantar wound since discharge. He is keeping the right posterior knee wound covered. He is getting a new sleeve for his prostatic on 5/26. He currently denies signs of infection. 6/1; patient presents for 1 week follow-up. He is still on doxycycline. He has not been dressing the back of the right knee wound. He states he is receiving his new prosthetic sleeve tomorrow. He states he is using silver alginate to the plantar foot wound. He currently denies signs of infection. He states he has not been offloading the left foot wound 6/7; patient presents for 1 week follow-up. He completes his last dose of doxycycline today. He finally received his prosthetic sleeve for the right side and he states it feels much better. He has been using antibiotic ointment to the back of that right knee wound. He has been using silver alginate to the plantar foot wound. He currently denies signs of infection. 6/21; patient presents for 2-week follow-up. He reports no issues with his prosthetic sleeve. He has been using antibiotic ointment to the back of the right knee. He has been using silver alginate to the plantar foot wound. He has no issues or complaints today.  He denies signs of infection. 7/5; patient presents for 2-week follow-up. He reports no issues or complaints today. He continues to use antibiotic ointment to the right posterior knee wound and silver alginate to the left plantar foot wound. He denies signs of infection. 7/19; patient presents for 2-week follow-up. He continues to use antibiotic ointment to the  right posterior knee and silver alginate to the left plantar foot wound. He has no issues or complaints today. He denies signs of infection. 8/4; patient presents for 2-week follow-up. He has no issues or complaints today. He denies signs of infection. 8/18; patient presents for 2-week follow-up. He has no issues or concerns today. He has been approved for Apligraf and he would like to have this placed today. He denies signs of infection and reports that his right posterior knee wound is closed. 8/25; patient presents for 1 week follow-up. He had no issues with his first Apligraf placement last week. He did however develop an abscess to the back of his right knee. He reports tenderness to this area. He denies systemic signs of infection. 9/1; patient presents for 1 week follow-up. He has had no issues with his plantar foot wound. The Apligraf has stayed in place over the past week. A wound culture was done at last clinic visit and a new antibiotic was sent to the pharmacy. Patient has not received this yet. He reports some mild tenderness to the back of his right knee. He denies systemic signs of infection. 9/8; patient presents for 1 week follow-up. He states he took Keflex and had diarrhea as a side effect. He was able to complete the course. He reports continued tenderness to the right posterior knee wound. He has some drainage still. He denies increased warmth or erythema to the area. He denies fever/chills or nausea/vomiting. He has no issues or complaints today about his plantar foot wound. 9/15; patient presents for follow-up. He reports improvement in pain to the back of his right leg. He does not report any drainage. He had an ultrasound completed on 9/9 that showed a superficial fluid collection concerning for abscess. We attempted to call patient with results with no answer. We did put an urgent referral to general surgery and he reports he has not received a call. He currently denies  systemic signs of infection. He reports no issues to his left plantar foot wound. 9/27; patient presents for follow-up. He has no issues or complaints today. He has had no issues with the previous wound to the back of his right leg. He has not made an appointment to see general surgery. He had Apligraf placed at last clinic visit to the plantar foot wound. He currently denies signs of infection. 10/7; patient presents for follow-up. Upon entering the room there is vomit throughout the entire floor. Patient states he does not feel well. I recommended he go to urgent care. I recommended following up next week for wound care. 10/10; patient presents for follow-up. He came last week for his follow-up however he was ill and started vomiting in the exam room. Today he states he feels better however he continues to have vomiting episodes. He states he attempted to go to urgent care/the ED but could not find a parking spot. He subsequently went home. He has no issues or complaints today regarding the foot wound. He never followed up with general surgery for the abscess identified on ultrasound to the back of his right knee. 10/20; patient presents for follow-up. He has no  issues or complaints today. Overall he feels well today. 10/27; patient presents for follow-up. He has no issues or complaints today. 11/3; patient presents for follow-up. He has no issues or complaints today. He states he does not want to follow-up with orthopedics to reevaluate the abscess in the back of his right leg. He denies signs of infection. 11/15; patient presents for follow-up. He has no issues or complaints today. He had been using silver alginate to the left first met head wound. He denies signs of infection. 11/22; patient presents for follow-up. He had Affinity placed at last clinic visit. He has no issues or complaints today. 11/29; patient presents for follow-up. He is in good spirits today. He has no issues or complaints  today. COLBY, REELS ([id number]) [phone].pdf Page 9 of 14 12/6; patient presents for follow-up. He has no issues or complaints today. 12/13; patient presents for follow-up. He denies signs of infection. He has no issues or complaints today. 12/20; patient presents for follow-up. He has no issues or complaints today. He denies signs of infection. 12/29; patient presents for follow-up. He states that a few days ago he has had reaccumulation of an abscess behind the right knee. He denies increased warmth or erythema to the area. He denies fever/chills or drainage. 1/6; patient presents for follow-up. He states he went to the [facility] emergency department to be evaluated for the abscess to his right posterior knee. He was started on clindamycin. They were unable to drain the abscess. Patient states that eventually the abscess ruptured. He reports improvement in his symptoms. He has no issues or complaints about his left foot wound. 1/17; patient presents for follow-up. He has no issues or complaints today. 1/24; patient presents for follow-up. He has no issues or complaints today. He denies signs of infection. 1/31; patient presents for follow-up. He has been approved for Affinity however has a 20% co-pay. Patient has declined further Affinity treatments due to this. He currently denies signs of infection. 2/7; patient presents for follow-up. He has no issues or complaints today. He denies signs of infection. He has been doing Hydrofera Blue dressing changes. 2/14; patient presents for follow-up. He has no issues or complaints today. He has been using blast X with collagen to the wound bed. He currently denies any issues. 2/28; patient presents for follow-up. He denies signs of infection. He reports using blast X with collagen to the wound bed. 3/7; patient presents for follow-up. He states he has been using gentamicin ointment and urea cream with dressing changes.  He reports that the back of his right knee has developed an abscess again and is draining. He states this started today. 02/03/2022: The patient came to clinic today without any dressing on his foot. He says that he took a shower yesterday and felt like he did not need a dressing and so he did not apply it. He is not offloading this foot and is wearing a regular athletic shoe. 3/21; patient presents for follow-up. He did not come in with a dressing on his wound. He states he has been using gentamicin ointment daily. He has a Pegasys insert to his tennis shoe for offloading. He currently denies signs of infection. 3/28; patient presents for follow-up. He reports using gentamicin ointment to the wound bed. He has no issues or complaints today. 04/21/2022ooconsult Patient presents because he states he tried to remove the callus on his plantar left foot and thinks he created a wound. He has been keeping the area  covered. He denies drainage. 06/22/2022 Patient presents for review of callus to the plantar left foot. He denies any drainage or open wounds to the area. READMISSION 12/08/2022 He returns with the same large callus on the first metatarsal head as has been present at prior visits. There is drainage and malodor emanating from the site, although no obvious tissue defect is initially appreciated. His most recent hemoglobin A1c was in October 2023 and was 7.2. His renal function has deteriorated to the point where he needs to be considering dialysis and actually had a consultation with vascular surgery for access creation, but he has not yet decided whether or not he wishes to go through with it. 12/15/2022: The x-ray that was obtained last week showed a fracture of his distal phalanx of the great toe but did not show any evidence of osteomyelitis. He does report that the wound has had an odor over the past week. His culture only came back today and does show pathogenic levels of Prevotella, along  with lower levels of other skin flora. Patient History Information obtained from Patient, Chart. Family History Diabetes - Maternal Grandparents,Mother, Heart Disease - Father, Hypertension - Father, No family history of Cancer, Hereditary Spherocytosis, Kidney Disease, Lung Disease, Seizures, Stroke, Thyroid Problems, Tuberculosis. Social History Current every day smoker, Marital Status - Single, Alcohol Use - Never, Drug Use - No History, Caffeine Use - Never. Medical History Eyes Denies history of Cataracts, Glaucoma, Optic Neuritis Ear/Nose/Mouth/Throat Denies history of Chronic sinus problems/congestion, Middle ear problems Hematologic/Lymphatic Patient has history of Anemia Denies history of Hemophilia, Human Immunodeficiency Virus, Lymphedema, Sickle Cell Disease Respiratory Denies history of Aspiration, Asthma, Chronic Obstructive Pulmonary Disease (COPD), Pneumothorax, Sleep Apnea, Tuberculosis Cardiovascular Patient has history of Hypertension, Peripheral Arterial Disease, Peripheral Venous Disease Denies history of Angina, Arrhythmia, Congestive Heart Failure, Coronary Artery Disease, Deep Vein Thrombosis, Hypotension, Myocardial Infarction, Phlebitis, Vasculitis Gastrointestinal Denies history of Cirrhosis , Colitis, Crohnoos, Hepatitis A, Hepatitis B, Hepatitis C Endocrine Patient has history of Type II Diabetes - oral meds Denies history of Type I Diabetes Genitourinary Denies history of End Stage Renal Disease Immunological Denies history of Lupus Erythematosus, Raynaudoos, Scleroderma Integumentary (Skin) Denies history of History of Burn DUC, CROCKET ([id number]) [id number] Page 10 of 14 Musculoskeletal Denies history of Gout, Rheumatoid Arthritis, Osteoarthritis, Osteomyelitis Neurologic Patient has history of Neuropathy Denies history of Dementia, Quadriplegia, Paraplegia, Seizure Disorder Oncologic Denies history of  Received Chemotherapy, Received Radiation Psychiatric Denies history of Anorexia/bulimia, Confinement Anxiety Hospitalization/Surgery History - fever 105, sepsis cellulitis right popliteal fossa 04/09/2021. - 2018 necrotizing fasciitis. Medical A Surgical History Notes [name] Constitutional Symptoms (General Health) falls , leukocytosis Respiratory During waking hours and catches himself not breathing, "gasp for air". Saw Dr Wynonia Lawman, he couldn't find a reason Cardiovascular hyperlipidemia Gastrointestinal GERD Genitourinary stage IV CKD Objective Constitutional no acute distress. Vitals Time Taken: 10:20 AM, Height: 77 in, Weight: 240 lbs, BMI: 28.5, Temperature: 97.8 F, Pulse: 106 bpm, Respiratory Rate: 20 breaths/min, Blood Pressure: 117/74 mmHg, Capillary Blood Glucose: 147 mg/dl. General Notes: glucose per pt report Respiratory Normal work of breathing on room air. General Notes: 12/15/2022: No real improvement this week. There is buildup of nonviable tissue and the bone is visible in the depths. There is an odor coming from the wound. Integumentary (Hair, Skin) Wound #4 status is Open. Original cause of wound was Gradually Appeared. The date acquired was: 10/07/2022. The wound has been in treatment 1 weeks. The wound is located on the Left,Medial Foot. The  wound measures 0.9cm length x 0.5cm width x 0.8cm depth; 0.353cm^2 area and 0.283cm^3 volume. There is bone and Fat Layer (Subcutaneous Tissue) exposed. There is no tunneling noted, however, there is undermining starting at 12:00 and ending at 12:00 with a maximum distance of 1.2cm. There is a medium amount of serosanguineous drainage noted. The wound margin is well defined and not attached to the wound base. There is medium (34-66%) pink, pale granulation within the wound bed. There is a medium (34-66%) amount of necrotic tissue within the wound bed including Adherent Slough. The periwound skin appearance had no abnormalities noted  for moisture. The periwound skin appearance had no abnormalities noted for color. The periwound skin appearance exhibited: Callus. 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 There was a Excisional Skin/Subcutaneous Tissue Debridement with a total area of 1 sq cm performed by Fredirick Maudlin, MD. With the following instrument(s): Curette to remove Viable and Non-Viable tissue/material. Material removed includes Callus, Subcutaneous Tissue, Slough, and Skin: Epidermis after achieving pain control using Lidocaine 5% topical ointment. No specimens were taken. A time out was conducted at 11:15, prior to the start of the procedure. A Minimum amount of bleeding was controlled with Pressure. The procedure was tolerated well with a pain level of 0 throughout and a pain level of 0 following the procedure. Post Debridement Measurements: 1cm length x 1cm width x 0.8cm depth; 0.628cm^3 volume. ALSON, MCPHEETERS ([id number]) [phone].pdf Page 11 of 14 Character of Wound/Ulcer Post Debridement is improved. Severity of Tissue Post Debridement is: Bone involvement without necrosis. Post procedure Diagnosis Wound #4: Same as Pre-Procedure General Notes: scribed by Baruch Gouty, RN for Dr. Celine Ahr. Plan Follow-up Appointments: Return Appointment in 1 week. - Dr, Celine Ahr RM 3 Tuesday 1/30 1:15 pm Anesthetic: Wound #4 Left,Medial Foot: (In clinic) Topical Lidocaine 5% applied  to wound bed Bathing/ Shower/ Hygiene: May shower with protection but do not get wound dressing(s) wet. Protect dressing(s) with water repellant cover (for example, large plastic bag) or a cast cover and may then take shower. Off-Loading: Other: - diabetic inserts for shoes Home Health: Wound #4 Left,Medial Foot: Admit to Home Health for skilled nursing wound care. May utilize formulary equivalent dressing for wound treatment orders unless otherwise specified. New wound care orders this week; continue Home Health for wound care. May utilize formulary equivalent dressing for wound treatment orders unless otherwise specified. Dressing changes to be completed by [place] on Tuesday / Thursday / Saturday except when patient has scheduled visit at [facility]. The following medication(s) was prescribed: Augmentin oral 500 mg-125 mg tablet 1 tab p.o. daily x 10 days starting 12/15/2022 WOUND #4: - Foot Wound Laterality: Left, Medial Cleanser: Wound Cleanser (Generic) 1 x Per Day/10 Days Discharge Instructions: Cleanse the wound with wound cleanser prior to applying a clean dressing using gauze sponges, not tissue or cotton balls. Prim Dressing: Hydrofera Blue Classic Foam Rope Dressing, 9x6 (mm/in) (Generic) 1 x Per Day/10 Days ary Discharge Instructions: Moisten with saline prior to packing Secondary Dressing: Optifoam Non-Adhesive Dressing, 4x4 in (Generic) 1 x Per  Day/10 Days Discharge Instructions: Apply over primary dressing as directed. Secondary Dressing: Woven Gauze Sponge, Non-Sterile 4x4 in (Generic) 1 x Per Day/10 Days Discharge Instructions: Apply over primary dressing as directed. Secured With: [employer], 4.5x3.1 (in/yd) (Generic) 1 x Per Day/10 Days Discharge Instructions: Secure with Kerlix as directed. Secured With: 58M Medipore H Soft Cloth Surgical T ape, 4 x 10 (in/yd) (Generic) 1 x Per Day/10 Days Discharge Instructions: Secure with tape as  directed. 12/15/2022: No real improvement this week. There is buildup of nonviable tissue and the bone is visible in the depths. There is an odor coming from the wound. I used a curette to debride the nonviable subcutaneous tissue from the wound. I have prescribed Augmentin for a 10-day course to cover the Prevotella that was cultured. We will continue to pack the wound with Hydrofera Blue. We will need to monitor him closely for development of osteomyelitis given the bone exposed, despite the negative x-ray. Follow-up in 1 week. Electronic Signature(s) Signed: 12/15/2022 12:41:20 PM By: Fredirick Maudlin MD FACS Signed: 12/16/2022 5:16:51 PM By: Baruch Gouty RN, BSN Previous Signature: 12/15/2022 11:42:18 AM Version By: Fredirick Maudlin MD FACS Previous Signature: 12/15/2022 11:38:52 AM Version By: Fredirick Maudlin MD FACS Entered By: Baruch Gouty on 12/15/2022 11:56:32 -------------------------------------------------------------------------------- HxROS Details Patient Name: Date of Service: STRICKLA [name], [name] 12/15/2022 10:30 A M Medical Record Number: [id number] Patient Account Number: [id number] Date of Birth/Sex: Treating RN: 1970-12-22 (52 y.o. M) Primary Care Provider: [name] Other Clinician: Referring Provider: Treating Provider/Extender: Jeananne Rama, Mayuri Weeks in Treatment: 1 Information Obtained From Patient Chart Constitutional Symptoms ([place]) Medical History: Past Medical History NotesCREW, GOREN ([id number]) [phone].pdf Page 12 of 14 falls , leukocytosis Eyes Medical History: Negative for: Cataracts; Glaucoma; Optic Neuritis Ear/Nose/Mouth/Throat Medical History: Negative for: Chronic sinus problems/congestion; Middle ear problems Hematologic/Lymphatic Medical History: Positive for: Anemia Negative for: Hemophilia; Human Immunodeficiency Virus; Lymphedema; Sickle Cell  Disease Respiratory Medical History: Negative for: Aspiration; Asthma; Chronic Obstructive Pulmonary Disease (COPD); Pneumothorax; Sleep Apnea; Tuberculosis Past Medical History Notes: During waking hours and catches himself not breathing, "gasp for air". Saw Dr Wynonia Lawman, he couldn't find a reason Cardiovascular Medical History: Positive for: Hypertension; Peripheral Arterial Disease; Peripheral Venous Disease Negative for: Angina; Arrhythmia; Congestive Heart Failure; Coronary Artery Disease; Deep Vein Thrombosis; Hypotension; Myocardial Infarction; Phlebitis; Vasculitis Past Medical History Notes: hyperlipidemia Gastrointestinal Medical History: Negative for: Cirrhosis ; Colitis; Crohns; Hepatitis A; Hepatitis B; Hepatitis C Past Medical History Notes: GERD Endocrine Medical History: Positive for: Type II Diabetes - oral meds Negative for: Type I Diabetes Time with diabetes: 2014 Treated with: Insulin, Oral agents Blood sugar tested every day: Yes Tested : CGM Blood sugar testing results: Breakfast: 91 Genitourinary Medical History: Negative for: End Stage Renal Disease Past Medical History Notes: stage IV CKD Immunological Medical History: Negative for: Lupus Erythematosus; Raynauds; Scleroderma Integumentary (Skin) Medical History: Negative for: History of Burn Musculoskeletal Medical History: Negative for: Gout; Rheumatoid Arthritis; Osteoarthritis; Osteomyelitis Neurologic Medical History: Positive for: Neuropathy Negative for: Dementia; Quadriplegia; Paraplegia; Seizure Disorder LAFE, CLERK ([id number]) [phone].pdf Page 13 of 14 Oncologic Medical History: Negative for: Received Chemotherapy; Received Radiation Psychiatric Medical History: Negative for: Anorexia/bulimia; Confinement Anxiety Immunizations Pneumococcal Vaccine: Received Pneumococcal Vaccination: No Implantable Devices None Hospitalization / Surgery  History Type of Hospitalization/Surgery fever 105, sepsis cellulitis right popliteal fossa 04/09/2021 2018 necrotizing fasciitis Family and Social History Cancer: No; Diabetes: Yes - Maternal Grandparents,Mother; Heart Disease: Yes - Father; Hereditary Spherocytosis: No;  Hypertension: Yes - Father; Kidney Disease: No; Lung Disease: No; Seizures: No; Stroke: No; Thyroid Problems: No; Tuberculosis: No; Current every day smoker; Marital Status - Single; Alcohol Use: Never; Drug Use: No History; Caffeine Use: Never; Financial Concerns: No; Food, Clothing or Shelter Needs: No; Support System Lacking: No; Transportation Concerns: No Electronic Signature(s) Signed: 12/15/2022 12:41:20 PM By: Fredirick Maudlin MD FACS Entered By: Fredirick Maudlin on 12/15/2022 11:35:31 -------------------------------------------------------------------------------- SuperBill Details Patient Name: Date of Service: [name] [name], [name] 12/15/2022 Medical Record Number: [id number] Patient Account Number: [id number] Date of Birth/Sex: Treating RN: 01/23/1971 (52 y.o. M) Primary Care Provider: [name] Other Clinician: Referring Provider: Treating Provider/Extender: Jeananne Rama, Mayuri Weeks in Treatment: 1 Diagnosis Coding ICD-10 Codes Code Description [phone] Non-pressure chronic ulcer of other part of left foot with bone involvement without evidence of necrosis E11.621 Type 2 diabetes mellitus with foot ulcer N18.5 Chronic kidney disease, stage 5 F17.200 Nicotine dependence, unspecified, uncomplicated L57.26 Type 2 diabetes mellitus with hyperglycemia Z89.511 Acquired absence of right leg below knee Facility Procedures : CPT4 Code: [id number] Description: [facility] - DEB SUBQ TISSUE 20 SQ CM/< ICD-10 Diagnosis Description L97.526 Non-pressure chronic ulcer of other part of left foot with bone involvement wit E11.621 Type 2 diabetes mellitus with foot ulcer Modifier: hout evidence of  necr Quantity: 1 osis Physician Procedures MARKEE, MATERA ([id number]): CPT4 Code Description [id number] - WC PHYS LEVEL 4 - EST PT ICD-10 Diagnosis Description L97.526 Non-pressure chronic ulcer of other part of left foot with bone i E11.621 Type 2 diabetes mellitus with foot ulcer N18.5  Chronic kidney disease, stage 5 [id number] Page 14 of 14: Quantity Modifier 25 1 nvolvement without evidence of necrosis Hsiao, Cote L ([id number]): [id number] - WC PHYS SUBQ TISS 20 SQ CM ICD-10 Diagnosis Description L97.526 Non-pressure chronic ulcer of other part of left foot with bone i E11.621 Type 2 diabetes mellitus with foot ulcer [id number] Page 14 of 14: 1 nvolvement without evidence of necrosis Electronic Signature(s) Signed: 12/15/2022 11:42:30 AM By: Fredirick Maudlin MD FACS Previous Signature: 12/15/2022 11:40:03 AM Version By: Fredirick Maudlin MD FACS Entered By: Fredirick Maudlin on 12/15/2022 11:42:29

## 2022-12-16 NOTE — Telephone Encounter (Signed)
Received a call from [place] at [facility] advising [place] to place dialysis access.   Attempted to reach patient to schedule surgery for left arm AVF/AVG. Left message for patient to return call.

## 2022-12-17 ENCOUNTER — Other Ambulatory Visit: Payer: Self-pay

## 2022-12-17 ENCOUNTER — Inpatient Hospital Stay (HOSPITAL_COMMUNITY): Admission: RE | Admit: 2022-12-17 | Payer: 59 | Source: Ambulatory Visit

## 2022-12-18 ENCOUNTER — Ambulatory Visit: Payer: Medicare Other | Admitting: Podiatry

## 2022-12-19 MED ORDER — BACLOFEN 10 MG PO TABS: ORAL_TABLET | ORAL | 0 refills | Status: DC

## 2022-12-21 NOTE — Telephone Encounter (Signed)
Patient returned call to schedule surgery for next week. Offered patient multiple dates but he declined every offer. He stated he cannot schedule on Monday or Tuesday's and could not come at 530 AM and would prefer an afternoon appointment. Advised patient at this time there are no late appointments and suggested him to callback next week to see if a later time is available. Patient agreed to this plan.

## 2022-12-22 ENCOUNTER — Encounter (HOSPITAL_BASED_OUTPATIENT_CLINIC_OR_DEPARTMENT_OTHER): Payer: 59 | Admitting: General Surgery

## 2022-12-22 DIAGNOSIS — E11621 Type 2 diabetes mellitus with foot ulcer: Secondary | ICD-10-CM | POA: Diagnosis not present

## 2022-12-22 NOTE — Progress Notes (Signed)
SUHAYB, ANZALONE ([id number]) [id number] Page 1 of 14 Visit Report for 12/22/2022 Chief Complaint Document Details Patient Name: Date of Service: [name] 12/22/2022 1:30 PM Medical Record Number: [id number] Patient Account Number: [id number] Date of Birth/Sex: Treating RN: [birth year] (52 y.o. M) Primary Care Provider: [name] Other Clinician: Referring Provider: Treating Provider/Extender: Jeananne Rama, Mayuri Weeks in Treatment: 2 Information Obtained from: Patient Chief Complaint patient is here for a review of the wound on his plantar left first metatarsal head and back of his right leg 06/22/2022; patient presents for review of callus on the plantar aspect of the left first met head 12/08/2022: callus left 1st met head, now with deep ulcer underlying Electronic Signature(s) Signed: 12/22/2022 2:12:56 PM By: Fredirick Maudlin MD FACS Entered By: Fredirick Maudlin on 12/22/2022 14:12:56 -------------------------------------------------------------------------------- Debridement Details Patient Name: Date of Service: [name], [name] 12/22/2022 1:30 PM Medical Record Number: [id number] Patient Account Number: [id number] Date of Birth/Sex: Treating RN: 1971/08/22 (52 y.o. [name] Primary Care Provider: [name] Other Clinician: Referring Provider: Treating Provider/Extender: Jeananne Rama, Mayuri Weeks in Treatment: 2 Debridement Performed for Assessment: Wound #4 Left,Medial Foot Performed By: Physician Fredirick Maudlin, MD Debridement Type: Debridement Severity of Tissue Pre Debridement: Fat layer exposed Level of Consciousness (Pre-procedure): Awake and Alert Pre-procedure Verification/Time Out Yes - 13:50 Taken: Start Time: 13:50 Pain Control: Lidocaine 5% topical ointment T Area Debrided (L x W): otal 0.9 (cm) x 0.5 (cm) = 0.45 (cm) Tissue and other material debrided:  Non-Viable, Callus, Subcutaneous Level: Skin/Subcutaneous Tissue Debridement Description: Excisional Instrument: Curette Bleeding: Minimum Hemostasis Achieved: Pressure End Time: 13:52 Procedural Pain: 0 Post Procedural Pain: 0 Response to Treatment: Procedure was tolerated well Level of Consciousness (Post- Awake and Alert procedure): Post Debridement Measurements of Total Wound Length: (cm) 0.9 Width: (cm) 0.5 Depth: (cm) 0.9 Volume: (cm) 0.318 Loeza, Kathan L ([id number]) [id number] Page 2 of 14 Character of Wound/Ulcer Post Debridement: Improved Severity of Tissue Post Debridement: Fat layer exposed Post Procedure Diagnosis Same as Pre-procedure Notes Scribed for Dr. Celine Ahr by J.Scotton Electronic Signature(s) Signed: 12/22/2022 3:42:54 PM By: Fredirick Maudlin MD FACS Signed: 12/22/2022 4:46:41 PM By: Dellie Catholic RN Entered By: Dellie Catholic on 12/22/2022 13:59:16 -------------------------------------------------------------------------------- HPI Details Patient Name: Date of Service: [name], [name] 12/22/2022 1:30 PM Medical Record Number: [id number] Patient Account Number: [id number] Date of Birth/Sex: Treating RN: 1971-04-25 (52 y.o. M) Primary Care Provider: [name] Other Clinician: Referring Provider: Treating Provider/Extender: Jeananne Rama, Mayuri Weeks in Treatment: 2 History of Present Illness HPI Description: 04/21/18 ADMISSION This is a 52 year old man who is a type II diabetic. In spite of fact his hemoglobin A1c is actually quite good 5.73 months ago he is at a really difficult time over the last 6-7 months. He developed a rapidly progressive infection in the right foot in November 2018 associated with osteomyelitis and necrotizing fasciitis. 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 The stump required and a revision on 11/24/17. The stump revision was advertised as being secondary to falls. I'm  not sure the progressive history here however this area is actually closed over. The patient tells me over the same timeframe he has had wounds on the plantar aspect of his left foot. In the ED saw Dr. Sharol Given in April of this year. 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 It is really not clear that this patient is been dressing this  with anything. He came in with the clinic without any specific dressing on the wound areas using his own tennis shoe. He tells me that he only ambulates of course to do a pivot transfer. He does not have his prosthesis for the right leg as of yet and he blames Medicaid for this. He does however use the foot to push himself along in his wheelchair at home. The patient has not had formal arterial studies. ABI in our clinic on the left was 1.13. Patient's past medical history includes type 2 diabetes, fracture of the right fibula. I see that he was treated for abscesses on his right buttock and chest in 2016. I did not look at the microbiology of this. 04/28/18; patient comes back in the clinic today with the wound pretty much the same as when he came in here last week. Small opening lots of undermining relatively. He tells [place] that nothing is really been on this for 3 days in spite of the fact that we gave him enough to dress this easily especially such a small wound. He says he lives with his mother, she is not capable of assisting with this he is changing the dressings himself. 05/05/18; much better-looking wound today. Smaller. There is some undermining medially however that's only perhaps 2 mm. No surrounding erythema. We've been using silver collagen 05/12/18; small wound on the first metatarsal head. No undermining no surrounding erythema. We've been using silver collagen he has home health coming out to change 05/20/18 the wound on the first metatarsal head looks better. Covered in surface debris/callus nonviable tissue.  Required debridement but post debridement this looks quite good. We've been using silver collagen. He has home health 05/27/18; first metatarsal head wound continues to improve. Just about completely closed. Still a lot of surface debridement callus. We've been using silver collagen 06/06/18; the first metatarsal wound is completely healed over. There is still a lot of callus. I gently removed some of this just to make sure that there was no open area and there is not. The patient mentions to me that his left leg has felt like "lead" for about a week READMISSION 08/16/2020 This is a 52 year old man with type 2 diabetes. He returns to clinic today with a 1 month history of a blister and callus over the first metatarsal head. This is in exactly the same area as when he was here in 2019. 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 He has standard running shoes on the left foot to match the area in his prosthesis. He has only been covering this area with a Band-Aid has not really been specifically dressing this. ABI in our clinic on the left was 1.16. This is essentially stable from the value in 2019 10/1 the patient has a linear wound over the left first metatarsal head. Again not a lot of callus thick skin around this that I removed with a #3 curette. This is not go deep to bone or muscle it does not look to be infected. 10/8 first metatarsal head. The wound looks like it is closing however this is going to be a difficult area to offload in the future. He has a size 15 shoe and he says his shoes are years old. The inserts in these current shoes are totally useless and I have advised him to replace these. He may need a new pair of shoes and I have he got original prosthesis  at [facility] the right I have asked him to go back there. 11/9; the patient has not been here in more than a month. Not sure he was healed when he was here the last time. I told him he  would have to get new shoes and certainly offload the area over the left first metatarsal head. He has done nothing of the kind. He arrives back in clinic with the same old new balance sneakers. A very large separating callus over the first metatarsal head on the left 11/16; he has purchased new shoes and has at least 2 insoles like I asked. The wound is measuring much smaller. He is using silver alginate he changes the dressing himself TRAYVION, EMBLETON ([id number]) [id number] Page 3 of 14 11/30; wound is measurably smaller in length of about a half a centimeter. This is generally very little depth. We have been using silver alginate. He changes the dressing himself every second day. 12/14; wound is about the same size. Significant undermining medially relative to the size of the wound. We have been using silver alginate. There is not a way to offload this area as he walks with a prosthesis on the right leg 12/28; wound is about the same size. Again he has undermining medially. I am using polymen on this 12/02/2020; the wound looks smaller. Still a senescent edge. We have been using polymen 1/24; the wound is come down a few millimeters. Still a senescent edge. I have been using polymen 2/8; the wound is come down slightly. Still with slight undermining from 12-6 o'clock I have been using polymen partially to get offloading on this area which is opposed by his prosthesis on the other leg. 2/22 quite open improvement he still has a comma shaped wound on the left first metatarsal head. Some depth. Using polymen 3/14; he still has the same problem of a comma shaped area over the left first metatarsal head. This seems to have had more depth today at 0.6 cm. We have been using polymen. The patient changes this himself every second day. I explored the idea of a total contact cast on the left leg however this is his driving foot. He was not enamored with the idea of not being  able to drive and states that he does not have anybody else to get him around 3/28; again he comes in with a smaller looking wound however there is depth and undermining requiring debridement. We have been using Hydrofera Blue, changed to silver collagen today. The patient is doing the dressing himself 4/11; patient presents today for follow-up of his left diabetic foot wound. He denies any issues in the past 2 weeks. He is currently using silver collagen every other day with dressing changes. He does not use diabetic shoes or special inserts to his sneakers. He does not use felt pads for offloading. 4/19; patient presents for his 1 week follow-up. He denies any issues. He reports using Hydrofera Blue and has not been using silver collagen for dressing changes. He reports minimal drainage to the wound. He overall feels well. 5/3; patient presents for 2-week follow-up. He has been using silver collagen without issues. He has no complaints today and states he overall feels well. 5/17; patient presents for 2-week follow-up. He states that the sleeve is rubbing up against the back of his right leg and causing a wound. He states this happened a week ago and has not been dressing the wound. The plantar wound is stable and he has  been using collagen every other day. 5/24; Patient was had a recent hospitalization for cellulitis of his right popliteal fossa. He was admitted and started on IV antibiotics. He does not recall the plantar wound dressed while inpatient. He feels a lot better today. He was discharged on 5/21 on oral antibiotics and has not picked this up until today. He reports using collagen to the plantar wound since discharge. He is keeping the right posterior knee wound covered. He is getting a new sleeve for his prostatic on 5/26. He currently denies signs of infection. 6/1; patient presents for 1 week follow-up. He is still on doxycycline. He has not been dressing the back of the right knee  wound. He states he is receiving his new prosthetic sleeve tomorrow. He states he is using silver alginate to the plantar foot wound. He currently denies signs of infection. He states he has not been offloading the left foot wound 6/7; patient presents for 1 week follow-up. He completes his last dose of doxycycline today. He finally received his prosthetic sleeve for the right side and he states it feels much better. He has been using antibiotic ointment to the back of that right knee wound. He has been using silver alginate to the plantar foot wound. He currently denies signs of infection. 6/21; patient presents for 2-week follow-up. He reports no issues with his prosthetic sleeve. He has been using antibiotic ointment to the back of the right knee. He has been using silver alginate to the plantar foot wound. He has no issues or complaints today. He denies signs of infection. 7/5; patient presents for 2-week follow-up. He reports no issues or complaints today. He continues to use antibiotic ointment to the right posterior knee wound and silver alginate to the left plantar foot wound. He denies signs of infection. 7/19; patient presents for 2-week follow-up. He continues to use antibiotic ointment to the right posterior knee and silver alginate to the left plantar foot wound. He has no issues or complaints today. He denies signs of infection. 8/4; patient presents for 2-week follow-up. He has no issues or complaints today. He denies signs of infection. 8/18; patient presents for 2-week follow-up. He has no issues or concerns today. He has been approved for Apligraf and he would like to have this placed today. He denies signs of infection and reports that his right posterior knee wound is closed. 8/25; patient presents for 1 week follow-up. He had no issues with his first Apligraf placement last week. He did however develop an abscess to the back of his right knee. He reports tenderness to this area.  He denies systemic signs of infection. 9/1; patient presents for 1 week follow-up. He has had no issues with his plantar foot wound. The Apligraf has stayed in place over the past week. A wound culture was done at last clinic visit and a new antibiotic was sent to the pharmacy. Patient has not received this yet. He reports some mild tenderness to the back of his right knee. He denies systemic signs of infection. 9/8; patient presents for 1 week follow-up. He states he took Keflex and had diarrhea as a side effect. He was able to complete the course. He reports continued tenderness to the right posterior knee wound. He has some drainage still. He denies increased warmth or erythema to the area. He denies fever/chills or nausea/vomiting. He has no issues or complaints today about his plantar foot wound. 9/15; patient presents for follow-up. He reports improvement in  pain to the back of his right leg. He does not report any drainage. He had an ultrasound completed on 9/9 that showed a superficial fluid collection concerning for abscess. We attempted to call patient with results with no answer. We did put an urgent referral to general surgery and he reports he has not received a call. He currently denies systemic signs of infection. He reports no issues to his left plantar foot wound. 9/27; patient presents for follow-up. He has no issues or complaints today. He has had no issues with the previous wound to the back of his right leg. He has not made an appointment to see general surgery. He had Apligraf placed at last clinic visit to the plantar foot wound. He currently denies signs of infection. 10/7; patient presents for follow-up. Upon entering the room there is vomit throughout the entire floor. Patient states he does not feel well. I recommended he go to urgent care. I recommended following up next week for wound care. 10/10; patient presents for follow-up. He came last week for his follow-up however  he was ill and started vomiting in the exam room. Today he states he feels better however he continues to have vomiting episodes. He states he attempted to go to urgent care/the ED but could not find a parking spot. He subsequently went home. He has no issues or complaints today regarding the foot wound. He never followed up with general surgery for the abscess identified on ultrasound to the back of his right knee. 10/20; patient presents for follow-up. He has no issues or complaints today. Overall he feels well today. 10/27; patient presents for follow-up. He has no issues or complaints today. 11/3; patient presents for follow-up. He has no issues or complaints today. He states he does not want to follow-up with orthopedics to reevaluate the abscess in the back of his right leg. He denies signs of infection. 11/15; patient presents for follow-up. He has no issues or complaints today. He had been using silver alginate to the left first met head wound. He denies signs of infection. HAMDAN, TOSCANO ([id number]) [id number] Page 4 of 14 11/22; patient presents for follow-up. He had Affinity placed at last clinic visit. He has no issues or complaints today. 11/29; patient presents for follow-up. He is in good spirits today. He has no issues or complaints today. 12/6; patient presents for follow-up. He has no issues or complaints today. 12/13; patient presents for follow-up. He denies signs of infection. He has no issues or complaints today. 12/20; patient presents for follow-up. He has no issues or complaints today. He denies signs of infection. 12/29; patient presents for follow-up. He states that a few days ago he has had reaccumulation of an abscess behind the right knee. He denies increased warmth or erythema to the area. He denies fever/chills or drainage. 1/6; patient presents for follow-up. He states he went to the [facility] emergency department to be evaluated  for the abscess to his right posterior knee. He was started on clindamycin. They were unable to drain the abscess. Patient states that eventually the abscess ruptured. He reports improvement in his symptoms. He has no issues or complaints about his left foot wound. 1/17; patient presents for follow-up. He has no issues or complaints today. 1/24; patient presents for follow-up. He has no issues or complaints today. He denies signs of infection. 1/31; patient presents for follow-up. He has been approved for Affinity however has a 20% co-pay. Patient has declined further Affinity treatments  due to this. He currently denies signs of infection. 2/7; patient presents for follow-up. He has no issues or complaints today. He denies signs of infection. He has been doing Hydrofera Blue dressing changes. 2/14; patient presents for follow-up. He has no issues or complaints today. He has been using blast X with collagen to the wound bed. He currently denies any issues. 2/28; patient presents for follow-up. He denies signs of infection. He reports using blast X with collagen to the wound bed. 3/7; patient presents for follow-up. He states he has been using gentamicin ointment and urea cream with dressing changes. He reports that the back of his right knee has developed an abscess again and is draining. He states this started today. 02/03/2022: The patient came to clinic today without any dressing on his foot. He says that he took a shower yesterday and felt like he did not need a dressing and so he did not apply it. He is not offloading this foot and is wearing a regular athletic shoe. 3/21; patient presents for follow-up. He did not come in with a dressing on his wound. He states he has been using gentamicin ointment daily. He has a Pegasys insert to his tennis shoe for offloading. He currently denies signs of infection. 3/28; patient presents for follow-up. He reports using gentamicin ointment to the wound bed.  He has no issues or complaints today. 5/30/2023consult Patient presents because he states he tried to remove the callus on his plantar left foot and thinks he created a wound. He has been keeping the area covered. He denies drainage. 06/22/2022 Patient presents for review of callus to the plantar left foot. He denies any drainage or open wounds to the area. READMISSION 12/08/2022 He returns with the same large callus on the first metatarsal head as has been present at prior visits. There is drainage and malodor emanating from the site, although no obvious tissue defect is initially appreciated. His most recent hemoglobin A1c was in October 2023 and was 7.2. His renal function has deteriorated to the point where he needs to be considering dialysis and actually had a consultation with vascular surgery for access creation, but he has not yet decided whether or not he wishes to go through with it. 12/15/2022: The x-ray that was obtained last week showed a fracture of his distal phalanx of the great toe but did not show any evidence of osteomyelitis. He does report that the wound has had an odor over the past week. His culture only came back today and does show pathogenic levels of Prevotella, along with lower levels of other skin flora. 12/22/2022: The wound is basically unchanged in dimensions. There is less accumulation of debris within the wound. The odor has resolved. Electronic Signature(s) Signed: 12/22/2022 2:15:06 PM By: Fredirick Maudlin MD FACS Entered By: Fredirick Maudlin on 12/22/2022 14:15:06 -------------------------------------------------------------------------------- Physical Exam Details Patient Name: Date of Service: [name], [name] 12/22/2022 1:30 PM Medical Record Number: [id number] Patient Account Number: [id number] Date of Birth/Sex: Treating RN: 1971/04/08 (52 y.o. M) Primary Care Provider: [name] Other Clinician: Referring Provider: Treating  Provider/Extender: Jeananne Rama, Mayuri Weeks in Treatment: 2 Constitutional ICHOLAS, IRBY ([id number]) [id number] Page 5 of 14 Hypertensive, asymptomatic. . . . no acute distress. Respiratory Normal work of breathing on room air. Notes 12/22/2022: The wound is basically unchanged in dimensions. There is less accumulation of debris within the wound. The odor has resolved. Electronic Signature(s) Signed: 12/22/2022 2:15:50 PM By: Fredirick Maudlin MD FACS  Entered By: Fredirick Maudlin on 12/22/2022 14:15:50 -------------------------------------------------------------------------------- Physician Orders Details Patient Name: Date of Service: [name], [name] 12/22/2022 1:30 PM Medical Record Number: [id number] Patient Account Number: [id number] Date of Birth/Sex: Treating RN: October 22, 1971 (52 y.o. 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 - Dr. Celine Ahr Room 3 Return A Other: - +++ Please pick up Gentamicin from the pharmacy+++++ Anesthetic Wound #4 Left,Medial Foot (In clinic) Topical Lidocaine 5% applied to wound bed Bathing/ Shower/ Hygiene May shower with protection but do not get wound dressing(s) wet. Protect dressing(s) with water repellant cover (for example, large plastic bag) or a cast cover and may then take  shower. Off-Loading Other: - diabetic inserts for shoes Home Health Wound #4 Left,Medial Foot Admit to [facility] for skilled nursing wound care. May utilize formulary equivalent dressing for wound treatment orders unless otherwise specified. New wound care orders this week; continue Home Health for wound care. May utilize formulary equivalent dressing for wound treatment orders unless otherwise specified. Dressing changes to be completed by [facility] on Tuesday / Thursday / Saturday except when patient has scheduled visit at [facility]. Wound Treatment Wound #4 - Foot Wound Laterality: Left, Medial Cleanser: Wound Cleanser (Generic) 1 x Per Day/10 Days Discharge Instructions: Cleanse the wound with wound cleanser prior to applying a clean dressing using gauze sponges, not tissue or cotton balls. Topical: Gentamicin 1 x Per Day/10 Days Discharge Instructions: As directed by physician Prim Dressing: Hydrofera Blue Classic Foam Rope Dressing, 9x6 (mm/in) (Generic) 1 x Per Day/10 Days ary XAVIAN, HARDCASTLE ([id number]) [id number] Page 6 of 14 Discharge Instructions: Moisten with saline prior to packing Secondary Dressing: Optifoam Non-Adhesive Dressing, 4x4 in (Generic) 1 x Per Day/10 Days Discharge Instructions: Apply over primary dressing as directed. Secondary Dressing: Woven Gauze Sponge, Non-Sterile 4x4 in (Generic) 1 x Per Day/10 Days Discharge Instructions: Apply over primary dressing as directed. Secured With: [employer], 4.5x3.1 (in/yd) (Generic) 1 x Per Day/10 Days Discharge Instructions: Secure with Kerlix as directed. Secured With: 72M Medipore H Soft Cloth Surgical T ape, 4 x 10 (in/yd) (Generic) 1 x Per Day/10 Days Discharge Instructions: Secure with tape as directed. Patient Medications llergies: adhesive tape, Fer-Iron A Notifications Medication Indication Start End 12/22/2022 gentamicin DOSE topical 0.1 % ointment - Pack  into wound with Hydrofera Blue with daily dressing changes Electronic Signature(s) Signed: 12/22/2022 3:42:54 PM By: Fredirick Maudlin MD FACS Previous Signature: 12/22/2022 2:17:12 PM Version By: Fredirick Maudlin MD FACS Entered By: Fredirick Maudlin on 12/22/2022 14:18:06 -------------------------------------------------------------------------------- Problem List Details Patient Name: Date of Service: [name], [name] 12/22/2022 1:30 PM Medical Record Number: [id number] Patient Account Number: [id number] Date of Birth/Sex: Treating RN: 29-Apr-1971 (52 y.o. M) Primary Care Provider: [name] Other Clinician: Referring Provider: Treating Provider/Extender: Jeananne Rama, Mayuri Weeks in Treatment: 2 Active Problems ICD-10 Encounter Code Description Active Date MDM Diagnosis L97.526 Non-pressure chronic ulcer of other part of left foot with bone involvement 12/08/2022 No Yes without evidence of necrosis E11.621  Type 2 diabetes mellitus with foot ulcer 12/08/2022 No Yes N18.5 Chronic kidney disease, stage 5 12/08/2022 No Yes F17.200 Nicotine dependence, unspecified, uncomplicated 9/38/1017 No Yes E11.65 Type 2 diabetes mellitus with hyperglycemia 12/08/2022 No Yes Z89.511 Acquired absence of right leg below knee 12/08/2022 No Yes Mcroberts, Dejay L ([id number]) [id number] Page 7 of 14 Inactive Problems Resolved Problems Electronic Signature(s) Signed: 12/22/2022 2:11:51 PM By: Fredirick Maudlin MD FACS Entered By: Fredirick Maudlin on 12/22/2022 14:11:51 -------------------------------------------------------------------------------- Progress Note Details Patient Name: Date of Service: [name], [name] 12/22/2022 1:30 PM Medical Record Number: [id number] Patient Account Number: [id number] Date of Birth/Sex: Treating RN: [birth year] (52 y.o. M) Primary Care Provider: [name] Other Clinician: Referring Provider: Treating  Provider/Extender: Jeananne Rama, Mayuri Weeks in Treatment: 2 Subjective Chief Complaint Information obtained from Patient patient is here for a review of the wound on his plantar left first metatarsal head and back of his right leg 06/22/2022; patient presents for review of callus on the plantar aspect of the left first met head 12/08/2022: callus left 1st met head, now with deep ulcer underlying History of Present Illness (HPI) 04/21/18 ADMISSION This is a 52 year old man who is a type II diabetic. In spite of fact his hemoglobin A1c is actually quite good 5.73 months ago he is at a really difficult time over the last 6-7 months. He developed a rapidly progressive infection in the right foot in November 2018 associated with osteomyelitis and necrotizing fasciitis. 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 The stump required and a revision on 11/24/17. The stump revision was advertised as being secondary to falls. I'm not sure the progressive history here however this area is actually closed over. The patient tells me over the same timeframe he has had wounds on the plantar aspect of his left foot. In the ED saw Dr. Sharol Given in April of this year. 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 It is really not clear that this patient is been dressing this with anything. He came in with the clinic without any specific dressing on the wound areas using his own tennis shoe. He tells me that he only ambulates of course to do a pivot transfer. He does not have his prosthesis for the right leg as of yet and he blames Medicaid for this. He does however use the foot to push himself along in his wheelchair at home. The patient has not had formal arterial studies. ABI in our clinic on the left was 1.13. Patient's past medical history includes type 2 diabetes, fracture of the right fibula. I see that he was treated for abscesses on his right buttock and chest  in 2016. I did not look at the microbiology of this. 04/28/18; patient comes back in the clinic today with the wound pretty much the same as when he came in here last week. Small opening lots of undermining relatively. He tells [place] that nothing is really been on this for 3 days in spite of the fact that we gave him enough to dress this easily especially such a small wound. He says he lives with his mother, she is not capable of assisting with this he is changing the dressings himself. 05/05/18; much better-looking wound today. Smaller. There is some undermining medially however that's only perhaps 2 mm. No surrounding erythema. We've been using silver collagen 05/12/18; small wound on the first metatarsal head. No undermining no surrounding erythema. We've been using  silver collagen he has home health coming out to change 05/20/18 the wound on the first metatarsal head looks better. Covered in surface debris/callus nonviable tissue. Required debridement but post debridement this looks quite good. We've been using silver collagen. He has home health 05/27/18; first metatarsal head wound continues to improve. Just about completely closed. Still a lot of surface debridement callus. We've been using silver collagen 06/06/18; the first metatarsal wound is completely healed over. There is still a lot of callus. I gently removed some of this just to make sure that there was no open area and there is not. The patient mentions to me that his left leg has felt like "lead" for about a week READMISSION 08/16/2020 This is a 52 year old man with type 2 diabetes. He returns to clinic today with a 1 month history of a blister and callus over the first metatarsal head. This is in exactly the same area as when he was here in 2019. 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 He has standard running shoes on the left foot to match the area in his prosthesis. He has only been  covering this area with a Band-Aid has not really been specifically dressing this. ABI in our clinic on the left was 1.16. This is essentially stable from the value in 2019 10/1 the patient has a linear wound over the left first metatarsal head. Again not a lot of callus thick skin around this that I removed with a #3 curette. This is not go deep to bone or muscle it does not look to be infected. 10/8 first metatarsal head. The wound looks like it is closing however this is going to be a difficult area to offload in the future. He has a size 15 shoe and he says his shoes are years old. The inserts in these current shoes are totally useless and I have advised him to replace these. He may need a new pair of shoes and I have he got original prosthesis at [facility] [place] I have asked him to go back there. 11/9; the patient has not been here in more than a month. Not sure he was healed when he was here the last time. I told him he would have to get new shoes and certainly offload the area over the left first metatarsal head. He has done nothing of the kind. He arrives back in clinic with the same old new balance sneakers. A very large separating callus over the first metatarsal head on the left 11/16; he has purchased new shoes and has at least 2 insoles like I asked. The wound is measuring much smaller. He is using silver alginate he changes the NOLON, YELLIN ([id number]) [id number] Page 8 of 14 dressing himself 11/30; wound is measurably smaller in length of about a half a centimeter. This is generally very little depth. We have been using silver alginate. He changes the dressing himself every second day. 12/14; wound is about the same size. Significant undermining medially relative to the size of the wound. We have been using silver alginate. There is not a way to offload this area as he walks with a prosthesis on the right leg 12/28; wound is about the same  size. Again he has undermining medially. I am using polymen on this 12/02/2020; the wound looks smaller. Still a senescent edge. We have been using polymen 1/24; the wound is come down a few millimeters.  Still a senescent edge. I have been using polymen 2/8; the wound is come down slightly. Still with slight undermining from 12-6 o'clock I have been using polymen partially to get offloading on this area which is opposed by his prosthesis on the other leg. 2/22 quite open improvement he still has a comma shaped wound on the left first metatarsal head. Some depth. Using polymen 3/14; he still has the same problem of a comma shaped area over the left first metatarsal head. This seems to have had more depth today at 0.6 cm. We have been using polymen. The patient changes this himself every second day. I explored the idea of a total contact cast on the left leg however this is his driving foot. He was not enamored with the idea of not being able to drive and states that he does not have anybody else to get him around 3/28; again he comes in with a smaller looking wound however there is depth and undermining requiring debridement. We have been using Hydrofera Blue, changed to silver collagen today. The patient is doing the dressing himself 4/11; patient presents today for follow-up of his left diabetic foot wound. He denies any issues in the past 2 weeks. He is currently using silver collagen every other day with dressing changes. He does not use diabetic shoes or special inserts to his sneakers. He does not use felt pads for offloading. 4/19; patient presents for his 1 week follow-up. He denies any issues. He reports using Hydrofera Blue and has not been using silver collagen for dressing changes. He reports minimal drainage to the wound. He overall feels well. 5/3; patient presents for 2-week follow-up. He has been using silver collagen without issues. He has no complaints today and states he overall  feels well. 5/17; patient presents for 2-week follow-up. He states that the sleeve is rubbing up against the back of his right leg and causing a wound. He states this happened a week ago and has not been dressing the wound. The plantar wound is stable and he has been using collagen every other day. 5/24; Patient was had a recent hospitalization for cellulitis of his right popliteal fossa. He was admitted and started on IV antibiotics. He does not recall the plantar wound dressed while inpatient. He feels a lot better today. He was discharged on 5/21 on oral antibiotics and has not picked this up until today. He reports using collagen to the plantar wound since discharge. He is keeping the right posterior knee wound covered. He is getting a new sleeve for his prostatic on 5/26. He currently denies signs of infection. 6/1; patient presents for 1 week follow-up. He is still on doxycycline. He has not been dressing the back of the right knee wound. He states he is receiving his new prosthetic sleeve tomorrow. He states he is using silver alginate to the plantar foot wound. He currently denies signs of infection. He states he has not been offloading the left foot wound 6/7; patient presents for 1 week follow-up. He completes his last dose of doxycycline today. He finally received his prosthetic sleeve for the right side and he states it feels much better. He has been using antibiotic ointment to the back of that right knee wound. He has been using silver alginate to the plantar foot wound. He currently denies signs of infection. 6/21; patient presents for 2-week follow-up. He reports no issues with his prosthetic sleeve. He has been using antibiotic ointment to the back of the  right knee. He has been using silver alginate to the plantar foot wound. He has no issues or complaints today. He denies signs of infection. 7/5; patient presents for 2-week follow-up. He reports no issues or complaints today. He  continues to use antibiotic ointment to the right posterior knee wound and silver alginate to the left plantar foot wound. He denies signs of infection. 7/19; patient presents for 2-week follow-up. He continues to use antibiotic ointment to the right posterior knee and silver alginate to the left plantar foot wound. He has no issues or complaints today. He denies signs of infection. 8/4; patient presents for 2-week follow-up. He has no issues or complaints today. He denies signs of infection. 8/18; patient presents for 2-week follow-up. He has no issues or concerns today. He has been approved for Apligraf and he would like to have this placed today. He denies signs of infection and reports that his right posterior knee wound is closed. 8/25; patient presents for 1 week follow-up. He had no issues with his first Apligraf placement last week. He did however develop an abscess to the back of his right knee. He reports tenderness to this area. He denies systemic signs of infection. 9/1; patient presents for 1 week follow-up. He has had no issues with his plantar foot wound. The Apligraf has stayed in place over the past week. A wound culture was done at last clinic visit and a new antibiotic was sent to the pharmacy. Patient has not received this yet. He reports some mild tenderness to the back of his right knee. He denies systemic signs of infection. 9/8; patient presents for 1 week follow-up. He states he took Keflex and had diarrhea as a side effect. He was able to complete the course. He reports continued tenderness to the right posterior knee wound. He has some drainage still. He denies increased warmth or erythema to the area. He denies fever/chills or nausea/vomiting. He has no issues or complaints today about his plantar foot wound. 9/15; patient presents for follow-up. He reports improvement in pain to the back of his right leg. He does not report any drainage. He had an ultrasound completed on  9/9 that showed a superficial fluid collection concerning for abscess. We attempted to call patient with results with no answer. We did put an urgent referral to general surgery and he reports he has not received a call. He currently denies systemic signs of infection. He reports no issues to his left plantar foot wound. 9/27; patient presents for follow-up. He has no issues or complaints today. He has had no issues with the previous wound to the back of his right leg. He has not made an appointment to see general surgery. He had Apligraf placed at last clinic visit to the plantar foot wound. He currently denies signs of infection. 10/7; patient presents for follow-up. Upon entering the room there is vomit throughout the entire floor. Patient states he does not feel well. I recommended he go to urgent care. I recommended following up next week for wound care. 10/10; patient presents for follow-up. He came last week for his follow-up however he was ill and started vomiting in the exam room. Today he states he feels better however he continues to have vomiting episodes. He states he attempted to go to urgent care/the ED but could not find a parking spot. He subsequently went home. He has no issues or complaints today regarding the foot wound. He never followed up with general surgery for  the abscess identified on ultrasound to the back of his right knee. 10/20; patient presents for follow-up. He has no issues or complaints today. Overall he feels well today. 10/27; patient presents for follow-up. He has no issues or complaints today. 11/3; patient presents for follow-up. He has no issues or complaints today. He states he does not want to follow-up with orthopedics to reevaluate the abscess in the back of his right leg. He denies signs of infection. 11/15; patient presents for follow-up. He has no issues or complaints today. He had been using silver alginate to the left first met head wound. He denies  signs TAVARIUS, GREWE ([id number]) [id number] Page 9 of 14 of infection. 11/22; patient presents for follow-up. He had Affinity placed at last clinic visit. He has no issues or complaints today. 11/29; patient presents for follow-up. He is in good spirits today. He has no issues or complaints today. 12/6; patient presents for follow-up. He has no issues or complaints today. 12/13; patient presents for follow-up. He denies signs of infection. He has no issues or complaints today. 12/20; patient presents for follow-up. He has no issues or complaints today. He denies signs of infection. 12/29; patient presents for follow-up. He states that a few days ago he has had reaccumulation of an abscess behind the right knee. He denies increased warmth or erythema to the area. He denies fever/chills or drainage. 1/6; patient presents for follow-up. He states he went to the [facility] emergency department to be evaluated for the abscess to his right posterior knee. He was started on clindamycin. They were unable to drain the abscess. Patient states that eventually the abscess ruptured. He reports improvement in his symptoms. He has no issues or complaints about his left foot wound. 1/17; patient presents for follow-up. He has no issues or complaints today. 1/24; patient presents for follow-up. He has no issues or complaints today. He denies signs of infection. 1/31; patient presents for follow-up. He has been approved for Affinity however has a 20% co-pay. Patient has declined further Affinity treatments due to this. He currently denies signs of infection. 2/7; patient presents for follow-up. He has no issues or complaints today. He denies signs of infection. He has been doing Hydrofera Blue dressing changes. 2/14; patient presents for follow-up. He has no issues or complaints today. He has been using blast X with collagen to the wound bed. He currently denies  any issues. 2/28; patient presents for follow-up. He denies signs of infection. He reports using blast X with collagen to the wound bed. 3/7; patient presents for follow-up. He states he has been using gentamicin ointment and urea cream with dressing changes. He reports that the back of his right knee has developed an abscess again and is draining. He states this started today. 02/03/2022: The patient came to clinic today without any dressing on his foot. He says that he took a shower yesterday and felt like he did not need a dressing and so he did not apply it. He is not offloading this foot and is wearing a regular athletic shoe. 3/21; patient presents for follow-up. He did not come in with a dressing on his wound. He states he has been using gentamicin ointment daily. He has a Pegasys insert to his tennis shoe for offloading. He currently denies signs of infection. 3/28; patient presents for follow-up. He reports using gentamicin ointment to the wound bed. He has no issues or complaints today. 04/21/2022ooconsult Patient presents because he states he tried to  remove the callus on his plantar left foot and thinks he created a wound. He has been keeping the area covered. He denies drainage. 06/22/2022 Patient presents for review of callus to the plantar left foot. He denies any drainage or open wounds to the area. READMISSION 12/08/2022 He returns with the same large callus on the first metatarsal head as has been present at prior visits. There is drainage and malodor emanating from the site, although no obvious tissue defect is initially appreciated. His most recent hemoglobin A1c was in October 2023 and was 7.2. His renal function has deteriorated to the point where he needs to be considering dialysis and actually had a consultation with vascular surgery for access creation, but he has not yet decided whether or not he wishes to go through with it. 12/15/2022: The x-ray that was obtained last  week showed a fracture of his distal phalanx of the great toe but did not show any evidence of osteomyelitis. He does report that the wound has had an odor over the past week. His culture only came back today and does show pathogenic levels of Prevotella, along with lower levels of other skin flora. 12/22/2022: The wound is basically unchanged in dimensions. There is less accumulation of debris within the wound. The odor has resolved. Patient History Information obtained from Patient, Chart. Family History Diabetes - Maternal Grandparents,Mother, Heart Disease - Father, Hypertension - Father, No family history of Cancer, Hereditary Spherocytosis, Kidney Disease, Lung Disease, Seizures, Stroke, Thyroid Problems, Tuberculosis. Social History Current every day smoker, Marital Status - Single, Alcohol Use - Never, Drug Use - No History, Caffeine Use - Never. Medical History Eyes Denies history of Cataracts, Glaucoma, Optic Neuritis Ear/Nose/Mouth/Throat Denies history of Chronic sinus problems/congestion, Middle ear problems Hematologic/Lymphatic Patient has history of Anemia Denies history of Hemophilia, Human Immunodeficiency Virus, Lymphedema, Sickle Cell Disease Respiratory Denies history of Aspiration, Asthma, Chronic Obstructive Pulmonary Disease (COPD), Pneumothorax, Sleep Apnea, Tuberculosis Cardiovascular Patient has history of Hypertension, Peripheral Arterial Disease, Peripheral Venous Disease Denies history of Angina, Arrhythmia, Congestive Heart Failure, Coronary Artery Disease, Deep Vein Thrombosis, Hypotension, Myocardial Infarction, Phlebitis, Vasculitis Gastrointestinal Denies history of Cirrhosis , Colitis, Crohnoos, Hepatitis A, Hepatitis B, Hepatitis C Endocrine DAMYAN, CORNE ([id number]) [id number] Page 10 of 14 Patient has history of Type II Diabetes - oral meds Denies history of Type I Diabetes Genitourinary Denies history of End  Stage Renal Disease Immunological Denies history of Lupus Erythematosus, Raynaudoos, Scleroderma Integumentary (Skin) Denies history of History of Burn Musculoskeletal Denies history of Gout, Rheumatoid Arthritis, Osteoarthritis, Osteomyelitis Neurologic Patient has history of Neuropathy Denies history of Dementia, Quadriplegia, Paraplegia, Seizure Disorder Oncologic Denies history of Received Chemotherapy, Received Radiation Psychiatric Denies history of Anorexia/bulimia, Confinement Anxiety Hospitalization/Surgery History - fever 105, sepsis cellulitis right popliteal fossa 04/09/2021. - 2018 necrotizing fasciitis. Medical A Surgical History Notes nd Constitutional Symptoms (General Health) falls , leukocytosis Respiratory During waking hours and catches himself not breathing, "gasp for air". Saw Dr Wynonia Lawman, he couldn't find a reason Cardiovascular hyperlipidemia Gastrointestinal GERD Genitourinary stage IV CKD Objective Constitutional Hypertensive, asymptomatic. no acute distress. Vitals Time Taken: 1:39 PM, Height: 77 in, Weight: 240 lbs, BMI: 28.5, Temperature: 97.6 F, Pulse: 99 bpm, Respiratory Rate: 18 breaths/min, Blood Pressure: 168/94 mmHg. Respiratory Normal work of breathing on room air. General Notes: 12/22/2022: The wound is basically unchanged in dimensions. There is less accumulation of debris within the wound. The odor has resolved. Integumentary (Hair, Skin) Wound #4 status is Open. Original cause of wound  was Gradually Appeared. The date acquired was: 10/07/2022. The wound has been in treatment 2 weeks. The wound is located on the Left,Medial Foot. The wound measures 0.9cm length x 0.5cm width x 0.9cm depth; 0.353cm^2 area and 0.318cm^3 volume. There is bone and Fat Layer (Subcutaneous Tissue) exposed. There is undermining starting at 12:00 and ending at 12:00 with a maximum distance of 1.2cm. There is a medium amount of serosanguineous drainage noted. The  wound margin is well defined and not attached to the wound base. There is medium (34-66%) pink, pale granulation within the wound bed. There is a medium (34-66%) amount of necrotic tissue within the wound bed including Eschar and Adherent Slough. The periwound skin appearance had no abnormalities noted for moisture. The periwound skin appearance had no abnormalities noted for color. The periwound skin appearance exhibited: Callus. 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 There was a Excisional Skin/Subcutaneous Tissue Debridement with a total area of 0.45 sq cm performed by Fredirick Maudlin, MD. With the following instrument(s): Curette to remove Non-Viable tissue/material. Material removed includes Callus and Subcutaneous Tissue and after achieving pain control using Lidocaine 5% topical ointment. No specimens were taken. A time out was conducted at 13:50, prior to the start of the procedure. A Minimum amount of bleeding was controlled with Pressure. The procedure was tolerated well with a pain level of 0 throughout and a pain level of 0 following the procedure. Post Debridement Measurements: 0.9cm length x 0.5cm width x 0.9cm depth; 0.318cm^3 volume. Character of Wound/Ulcer Post Debridement is improved. Severity of Tissue Post Debridement is: Fat layer exposed. Post procedure Diagnosis Wound #4: Same as  Pre-Procedure General Notes: Scribed for Dr. Celine Ahr by J.Scotton. Plan Follow-up Appointments: Return Appointment in 1 week. - Dr. Celine Ahr Room 3 Other: - +++ Please pick up Gentamicin from the pharmacy+++++ Anesthetic: Wound #4 Left,Medial Foot: (In clinic) Topical Lidocaine 5% applied to wound bed Bathing/ Shower/ Hygiene: May shower with protection but do not get wound dressing(s) wet. Protect dressing(s) with water repellant cover (for example, large plastic bag) or a cast cover and may then take shower. Off-Loading: Other: - diabetic inserts for shoes Home Health: Wound #4 Left,Medial Foot: Admit to Home Health for skilled nursing wound care. May utilize formulary equivalent dressing for wound treatment orders unless otherwise specified. New wound care orders this week; continue Home Health for wound care. May utilize formulary equivalent dressing for wound treatment orders unless otherwise specified. Dressing changes to be completed by Goehner on Tuesday / Thursday / Saturday except when patient has scheduled visit at [facility] ([facility]). The following medication(s) was prescribed: gentamicin topical 0.1 % ointment Pack into wound with Hydrofera Blue with daily dressing changes starting 12/22/2022 WOUND #4: - Foot Wound Laterality: Left, Medial Cleanser: Wound Cleanser (Generic) 1 x Per Day/10 Days Discharge Instructions: Cleanse the wound with wound cleanser prior to applying a clean dressing using gauze sponges, not tissue or cotton balls. Topical: Gentamicin 1 x Per Day/10 Days Discharge Instructions: As directed by physician Prim Dressing:  Hydrofera Blue Classic Foam Rope Dressing, 9x6 (mm/in) (Generic) 1 x Per Day/10 Days ary Discharge Instructions: Moisten with saline prior to packing Secondary Dressing: Optifoam Non-Adhesive Dressing, 4x4 in (Generic) 1 x Per Day/10 Days Discharge Instructions: Apply over primary dressing as directed. Secondary Dressing: Woven Gauze  Sponge, Non-Sterile 4x4 in (Generic) 1 x Per Day/10 Days Discharge Instructions: Apply over primary dressing as directed. Secured With: [employer], 4.5x3.1 (in/yd) (Generic) 1 x Per Day/10 Days Discharge Instructions: Secure with Kerlix as directed. Secured With: 18M Medipore H Soft Cloth Surgical T ape, 4 x 10 (in/yd) (Generic) 1 x Per Day/10 Days Discharge Instructions: Secure with tape as directed. 12/22/2022: The wound is basically unchanged in dimensions. There is less accumulation of debris within the wound. The odor has resolved. I used a curette to debride callus and nonviable subcutaneous tissue from the wound. I am going to add topical gentamicin to his dressing changes. We will continue to pack the wound with Hydrofera Blue. He should complete the oral Augmentin for the Prevotella cultured from his wound. Even though the x-ray did not show osteomyelitis, I am still concerned that it might be present and we may need to consider an MRI going forward. Follow-up in 1 week. Electronic Signature(s) Signed: 12/22/2022 2:19:36 PM By: Fredirick Maudlin MD FACS Entered By: Fredirick Maudlin on 12/22/2022 14:19:35 -------------------------------------------------------------------------------- HxROS Details Patient Name: Date of Service: [name], [name] 12/22/2022 1:30 PM Medical Record Number: [id number] Patient Account Number: [id number] Date of Birth/Sex: Treating RN: 1970-11-27 (52 y.o. M) Primary Care Provider: [name] Other Clinician: Referring Provider: Treating Provider/Extender: Jeananne Rama, Mayuri Weeks in Treatment: 2 EULALIO, REAMY ([id number]) [id number] Page 12 of 14 Information Obtained From Patient Chart Constitutional Symptoms (General Health) Medical History: Past Medical History Notes: falls , leukocytosis Eyes Medical History: Negative for: Cataracts; Glaucoma; Optic  Neuritis Ear/Nose/Mouth/Throat Medical History: Negative for: Chronic sinus problems/congestion; Middle ear problems Hematologic/Lymphatic Medical History: Positive for: Anemia Negative for: Hemophilia; Human Immunodeficiency Virus; Lymphedema; Sickle Cell Disease Respiratory Medical History: Negative for: Aspiration; Asthma; Chronic Obstructive Pulmonary Disease (COPD); Pneumothorax; Sleep Apnea; Tuberculosis Past Medical History Notes: During waking hours and catches himself not breathing, "gasp for air". Saw Dr Wynonia Lawman, he couldn't find a reason Cardiovascular Medical History: Positive for: Hypertension; Peripheral Arterial Disease; Peripheral Venous Disease Negative for: Angina; Arrhythmia; Congestive Heart Failure; Coronary Artery Disease; Deep Vein Thrombosis; Hypotension; Myocardial Infarction; Phlebitis; Vasculitis Past Medical History Notes: hyperlipidemia Gastrointestinal Medical History: Negative for: Cirrhosis ; Colitis; Crohns; Hepatitis A; Hepatitis B; Hepatitis C Past Medical History Notes: GERD Endocrine Medical History: Positive for: Type II Diabetes - oral meds Negative for: Type I Diabetes Time with diabetes: 2014 Treated with: Insulin, Oral agents Blood sugar tested every day: Yes Tested : CGM Blood sugar testing results: Breakfast: 91 Genitourinary Medical History: Negative for: End Stage Renal Disease Past Medical History Notes: stage IV CKD Immunological Medical History: Negative for: Lupus Erythematosus; Raynauds; Scleroderma Integumentary (Skin) Medical History: Negative for: History of Burn Musculoskeletal Medical HistoryEUEL, CASTILE ([id number]) [id number] Page 13 of 14 Negative for: Gout; Rheumatoid Arthritis; Osteoarthritis; Osteomyelitis Neurologic Medical History: Positive for: Neuropathy Negative for: Dementia; Quadriplegia; Paraplegia; Seizure Disorder Oncologic Medical History: Negative for:  Received Chemotherapy; Received Radiation Psychiatric Medical History: Negative for: Anorexia/bulimia; Confinement Anxiety Immunizations Pneumococcal Vaccine: Received Pneumococcal Vaccination: No Implantable Devices None Hospitalization / Surgery History Type of Hospitalization/Surgery fever 105, sepsis cellulitis right popliteal fossa 04/09/2021 2018 necrotizing fasciitis Family and Social History Cancer: No;  Diabetes: Yes - Maternal Grandparents,Mother; Heart Disease: Yes - Father; Hereditary Spherocytosis: No; Hypertension: Yes - Father; Kidney Disease: No; Lung Disease: No; Seizures: No; Stroke: No; Thyroid Problems: No; Tuberculosis: No; Current every day smoker; Marital Status - Single; Alcohol Use: Never; Drug Use: No History; Caffeine Use: Never; Financial Concerns: No; Food, Clothing or Shelter Needs: No; Support System Lacking: No; Transportation Concerns: No Electronic Signature(s) Signed: 12/22/2022 3:42:54 PM By: Fredirick Maudlin MD FACS Entered By: Fredirick Maudlin on 12/22/2022 14:15:15 -------------------------------------------------------------------------------- SuperBill Details Patient Name: Date of Service: [name], [name] 12/22/2022 Medical Record Number: [id number] Patient Account Number: [id number] Date of Birth/Sex: Treating RN: 1971-05-31 (52 y.o. M) Primary Care Provider: [name] Other Clinician: Referring Provider: Treating Provider/Extender: Jeananne Rama, Mayuri Weeks in Treatment: 2 Diagnosis Coding ICD-10 Codes Code Description [phone] Non-pressure chronic ulcer of other part of left foot with bone involvement without evidence of necrosis E11.621 Type 2 diabetes mellitus with foot ulcer N18.5 Chronic kidney disease, stage 5 F17.200 Nicotine dependence, unspecified, uncomplicated M75.44 Type 2 diabetes mellitus with hyperglycemia Z89.511 Acquired absence of right leg below knee Facility Procedures : VIRGAL, WARMUTH  Code: [id number] Etan L ([id number] ICD-10 L97 Description: 11042 - DEB SUBQ TISSUE 20 SQ CM/< 348) [id number] Diagnosis Description .526 Non-pressure chronic ulcer of other part of left foot with bone involvement without ev Modifier: [email or web address] idence of necros Quantity: 1 df Page 14 of 14 is Physician Procedures : CPT4 Code Description Modifier [id number] - WC PHYS LEVEL 4 - EST PT 25 ICD-10 Diagnosis Description L97.526 Non-pressure chronic ulcer of other part of left foot with bone involvement without evidence of necr E11.621 Type 2 diabetes mellitus with  foot ulcer F17.200 Nicotine dependence, unspecified, uncomplicated A30.9 Chronic kidney disease, stage 5 Quantity: 1 osis : [id number] 11042 - WC PHYS SUBQ TISS 20 SQ CM ICD-10 Diagnosis Description L97.526 Non-pressure chronic ulcer of other part of left foot with bone involvement without evidence of necr Quantity: 1 osis Electronic Signature(s) Signed: 12/22/2022 2:19:56 PM By: Fredirick Maudlin MD FACS Entered By: Fredirick Maudlin on 12/22/2022 14:19:56

## 2022-12-22 NOTE — Progress Notes (Signed)
SUHAIB, GUZZO ([id number]) [id number] Page 1 of 7 Visit Report for 12/22/2022 Arrival Information Details Patient Name: Date of Service: [name] [name] 12/22/2022 1:30 PM Medical Record Number: [id number] Patient Account Number: [id number] Date of Birth/Sex: Treating RN: [birth year] (52 y.o. [name] Primary Care Amani Marseille: [name] Other Clinician: Referring Jamaul Heist: Treating Emori Mumme/Extender: Jeananne Rama, Mayuri Weeks in Treatment: 2 Visit Information History Since Last Visit Added or deleted any medications: No Patient Arrived: Ambulatory Any new allergies or adverse reactions: No Arrival Time: 13:39 Had a fall or experienced change in No Accompanied By: self activities of daily living that may affect Transfer Assistance: None risk of falls: Signs or symptoms of abuse/neglect since last visito No Hospitalized since last visit: No Implantable device outside of the clinic excluding No cellular tissue based products placed in the center since last visit: Has Dressing in Place as Prescribed: Yes Pain Present Now: Yes Electronic Signature(s) Signed: 12/22/2022 4:46:41 PM By: Dellie Catholic RN Entered By: Dellie Catholic on 12/22/2022 13:39:56 -------------------------------------------------------------------------------- Encounter Discharge Information Details Patient Name: Date of Service: [name] [name], [name] 12/22/2022 1:30 PM Medical Record Number: [id number] Patient Account Number: [id number] Date of Birth/Sex: Treating RN: 1971-10-15 (52 y.o. [name] Primary Care Gwendolen Hewlett: [name] Other Clinician: Referring Veria Stradley: Treating Lauria Depoy/Extender: Jeananne Rama, Mayuri Weeks in Treatment: 2 Encounter Discharge Information Items Post Procedure Vitals Discharge Condition: Stable Temperature (F): 97.6 Ambulatory Status: Ambulatory Pulse (bpm): 99 Discharge Destination:  Home Respiratory Rate (breaths/min): 18 Transportation: Private Auto Blood Pressure (mmHg): 168/94 Accompanied By: self Schedule Follow-up Appointment: Yes Clinical Summary of Care: Patient Declined Electronic Signature(s) Signed: 12/22/2022 4:46:41 PM By: Dellie Catholic RN Entered By: Dellie Catholic on 12/22/2022 16:44:57 Kenedy, Sharlene Motts ([id number]) [id number] Page 2 of 7 -------------------------------------------------------------------------------- Lower Extremity Assessment Details Patient Name: Date of Service: [name] [name] 12/22/2022 1:30 PM Medical Record Number: [id number] Patient Account Number: [id number] Date of Birth/Sex: Treating RN: 04/21/1971 (52 y.o. [name] Primary Care Rosaleah Person: [name] Other Clinician: Referring Shantoya Geurts: Treating Travon Crochet/Extender: Jeananne Rama, Mayuri Weeks in Treatment: 2 Edema Assessment Assessed: [Left: No] [Right: No] Edema: [Left: N] [Right: o] Calf Left: Right: Point of Measurement: From Medial Instep 38 cm Ankle Left: Right: Point of Measurement: From Medial Instep 23.7 cm Vascular Assessment Pulses: Dorsalis Pedis Palpable: [Left:Yes] Electronic Signature(s) Signed: 12/22/2022 4:46:41 PM By: Dellie Catholic RN Entered By: Dellie Catholic on 12/22/2022 13:41:19 -------------------------------------------------------------------------------- Multi Wound Chart Details Patient Name: Date of Service: [name] [name], [name] 12/22/2022 1:30 PM Medical Record Number: [id number] Patient Account Number: [id number] Date of Birth/Sex: Treating RN: 16-Sep-1971 (52 y.o. M) Primary Care Jefferey Lippmann: [name] Other Clinician: Referring Dyllan Hughett: Treating Oluwakemi Salsberry/Extender: Jeananne Rama, Mayuri Weeks in Treatment: 2 Vital Signs Height(in): 77 Pulse(bpm): 99 Weight(lbs): 240 Blood Pressure(mmHg): 168/94 Body Mass Index(BMI): 28.5 Temperature(F):  97.6 Respiratory Rate(breaths/min): 18 [4:Photos:] [N/A:N/A] Left, Medial Foot N/A N/A Wound Location: Gradually Appeared N/A N/A Wounding Event: Diabetic Wound/Ulcer of the Lower N/A N/A Primary Etiology: Extremity Anemia, Hypertension, Peripheral N/A N/A Comorbid History: Arterial Disease, Peripheral Venous Disease, Type II Diabetes, Neuropathy 10/07/2022 N/A N/A Date Acquired: 2 N/A N/A Weeks of Treatment: Open N/A N/A Wound Status: No N/A N/A Wound Recurrence: 0.9x0.5x0.9 N/A N/A Measurements L x W x D (cm) 0.353 N/A N/A A (cm) : rea 0.318 N/A N/A Volume (cm) : 43.80% N/A N/A % Reduction in A rea: -404.80% N/A N/A % Reduction in Volume: 12 Starting Position 1 (o'clock):  12 Ending Position 1 (o'clock): 1.2 Maximum Distance 1 (cm): Yes N/A N/A Undermining: Grade 2 N/A N/A Classification: Medium N/A N/A Exudate A mount: Serosanguineous N/A N/A Exudate Type: red, brown N/A N/A Exudate Color: Well defined, not attached N/A N/A Wound Margin: Medium (34-66%) N/A N/A Granulation A mount: Pink, Pale N/A N/A Granulation Quality: Medium (34-66%) N/A N/A Necrotic A mount: Eschar, Adherent Slough N/A N/A Necrotic Tissue: Fat Layer (Subcutaneous Tissue): Yes N/A N/A Exposed Structures: Bone: Yes Fascia: No Tendon: No Muscle: No Joint: No None N/A N/A Epithelialization: Debridement - Excisional N/A N/A Debridement: Pre-procedure Verification/Time Out 13:50 N/A N/A Taken: Lidocaine 5% topical ointment N/A N/A Pain Control: Callus, Subcutaneous N/A N/A Tissue Debrided: Skin/Subcutaneous Tissue N/A N/A Level: 0.45 N/A N/A Debridement A (sq cm): rea Curette N/A N/A Instrument: Minimum N/A N/A Bleeding: Pressure N/A N/A Hemostasis A chieved: 0 N/A N/A Procedural Pain: 0 N/A N/A Post Procedural Pain: Procedure was tolerated well N/A N/A Debridement Treatment Response: 0.9x0.5x0.9 N/A N/A Post Debridement Measurements L x W x D  (cm) 0.318 N/A N/A Post Debridement Volume: (cm) Callus: Yes N/A N/A Periwound Skin Texture: No Abnormalities Noted N/A N/A Periwound Skin Moisture: No Abnormalities Noted N/A N/A Periwound Skin Color: Debridement N/A N/A Procedures Performed: Treatment Notes Electronic Signature(s) Signed: 12/22/2022 2:12:10 PM By: Fredirick Maudlin MD FACS Entered By: Fredirick Maudlin on 12/22/2022 14:12:10 -------------------------------------------------------------------------------- Multi-Disciplinary Care Plan Details Patient Name: Date of Service: [name] [name], [name] 12/22/2022 1:30 PM Medical Record Number: [id number] Patient Account Number: [id number] Date of Birth/Sex: Treating RN: 04-30-1971 (52 y.o. [name] Primary Care Lucile Didonato: [name] Other Clinician: Referring Malachai Schalk: Treating Terilyn Sano/Extender: Jeananne Rama, Mayuri Weeks in Treatment: 2 [name] [name], [name] [name] ([id number]) [id number] Page 4 of 7 Multidisciplinary Care Plan reviewed with physician Active Inactive Wound/Skin Impairment Nursing Diagnoses: Impaired tissue integrity Knowledge deficit related to ulceration/compromised skin integrity Goals: Patient/caregiver will verbalize understanding of skin care regimen Date Initiated: 12/08/2022 Target Resolution Date: 03/23/2023 Goal Status: Active Ulcer/skin breakdown will have a volume reduction of 30% by week 4 Date Initiated: 12/08/2022 Target Resolution Date: 03/23/2023 Goal Status: Active Interventions: Assess patient/caregiver ability to obtain necessary supplies Assess patient/caregiver ability to perform ulcer/skin care regimen upon admission and as needed Assess ulceration(s) every visit Provide education on smoking Provide education on ulcer and skin care Notes: Electronic Signature(s) Signed: 12/22/2022 4:46:41 PM By: Dellie Catholic RN Entered By: Dellie Catholic on 12/22/2022  16:43:27 -------------------------------------------------------------------------------- Pain Assessment Details Patient Name: Date of Service: [name] [name], [name] 12/22/2022 1:30 PM Medical Record Number: [id number] Patient Account Number: [id number] Date of Birth/Sex: Treating RN: August 06, 1971 (52 y.o. [name] Primary Care Gwenyth Dingee: [name] Other Clinician: Referring Amonie Wisser: Treating Ariyona Eid/Extender: Jeananne Rama, Mayuri Weeks in Treatment: 2 Active Problems Location of Pain Severity and Description of Pain Patient Has Paino Yes Site Locations Pain Location: Generalized Pain With Dressing Change: No Duration of the Pain. Constant / Intermittento Constant Rate the pain. Current Pain Level: 6 Worst Pain Level: 10 Least Pain Level: 4 Tolerable Pain Level: 6 Character of Pain Describe the Pain: [name] [name], [name] [name] ([id number]) [id number].pdf Page 5 of 7 Pain Management and Medication Current Pain Management: Medication: Yes Cold Application: No Rest: Yes Massage: No Activity: No T.E.N.S.: No Heat Application: No Leg drop or elevation: No Is the Current Pain Management Adequate: Adequate How does your wound impact your activities of daily livingo Sleep: No Bathing: No Appetite: No Relationship With Others: No Bladder Continence: No Emotions:  No Bowel Continence: No Work: No Toileting: No Drive: No Dressing: No Hobbies: No Electronic Signature(s) Signed: 12/22/2022 4:46:41 PM By: Dellie Catholic RN Entered By: Dellie Catholic on 12/22/2022 13:41:10 -------------------------------------------------------------------------------- Patient/Caregiver Education Details Patient Name: Date of Service: [name] [name] 1/30/2024andnbsp1:30 PM Medical Record Number: [id number] Patient Account Number: [id number] Date of Birth/Gender: Treating RN: 1971-01-24 (52 y.o. [name] Primary Care  Physician: [name] Other Clinician: Referring Physician: Treating Physician/Extender: Benay Pillow Weeks in Treatment: 2 Education Assessment Education Provided To: Patient Education Topics Provided Wound/Skin Impairment: Methods: Explain/Verbal Responses: Return demonstration correctly Electronic Signature(s) Signed: 12/22/2022 4:46:41 PM By: Dellie Catholic RN Entered By: Dellie Catholic on 12/22/2022 16:43:51 -------------------------------------------------------------------------------- Wound Assessment Details Patient Name: Date of Service: [name] [name], [name] 12/22/2022 1:30 PM Medical Record Number: [id number] Patient Account Number: [id number] Date of Birth/Sex: Treating RN: 1970-12-05 (52 y.o. [name] Primary Care Jaydyn Menon: [name] Other Clinician: Referring Braxden Lovering: Treating Francies Inch/Extender: Jeananne Rama, Mayuri Weeks in Treatment: 2 Wound Status Wound Number: 4 Primary Diabetic Wound/Ulcer of the Lower Extremity Etiology: [name] [name], [name] [name] ([id number]) [id number] Page 6 of 7 Etiology: Wound Location: Left, Medial Foot Wound Open Wounding Event: Gradually Appeared Status: Date Acquired: 10/07/2022 Comorbid Anemia, Hypertension, Peripheral Arterial Disease, Peripheral Weeks Of Treatment: 2 History: Venous Disease, Type II Diabetes, Neuropathy Clustered Wound: No Photos Wound Measurements Length: (cm) 0.9 Width: (cm) 0.5 Depth: (cm) 0.9 Area: (cm) 0.353 Volume: (cm) 0.318 % Reduction in Area: 43.8% % Reduction in Volume: -404.8% Epithelialization: None Undermining: Yes Starting Position (o'clock): 12 Ending Position (o'clock): 12 Maximum Distance: (cm) 1.2 Wound Description Classification: Grade 2 Wound Margin: Well defined, not attached Exudate Amount: Medium Exudate Type: Serosanguineous Exudate Color: red, brown Foul Odor After Cleansing:  No Slough/Fibrino Yes Wound Bed Granulation Amount: Medium (34-66%) Exposed Structure Granulation Quality: Pink, Pale Fascia Exposed: No Necrotic Amount: Medium (34-66%) Fat Layer (Subcutaneous Tissue) Exposed: Yes Necrotic Quality: Eschar, Adherent Slough Tendon Exposed: No Muscle Exposed: No Joint Exposed: No Bone Exposed: Yes Periwound Skin Texture Texture Color No Abnormalities Noted: No No Abnormalities Noted: Yes Callus: Yes Moisture No Abnormalities Noted: Yes Treatment Notes Wound #4 (Foot) Wound Laterality: Left, Medial Cleanser Wound Cleanser Discharge Instruction: Cleanse the wound with wound cleanser prior to applying a clean dressing using gauze sponges, not tissue or cotton balls. Peri-Wound Care Topical Gentamicin Discharge Instruction: As directed by physician Primary Dressing Hydrofera Blue Classic Foam Rope Dressing, 9x6 (mm/in) Discharge Instruction: Moisten with saline prior to packing Secondary Dressing Optifoam Non-Adhesive Dressing, 4x4 in Discharge Instruction: Apply over primary dressing as directed. [name] [name], [name] [name] ([id number]) [id number] Page 7 of 7 Woven Gauze Sponge, Non-Sterile 4x4 in Discharge Instruction: Apply over primary dressing as directed. Secured With [employer], 4.5x3.1 (in/yd) Discharge Instruction: Secure with Kerlix as directed. 29M Medipore H Soft Cloth Surgical T ape, 4 x 10 (in/yd) Discharge Instruction: Secure with tape as directed. Compression Wrap Compression Stockings Add-Ons Electronic Signature(s) Signed: 12/22/2022 4:46:41 PM By: Dellie Catholic RN Entered By: Dellie Catholic on 12/22/2022 13:48:23 -------------------------------------------------------------------------------- Vitals Details Patient Name: Date of Service: [name] [name], [name] 12/22/2022 1:30 PM Medical Record Number: [id number] Patient Account Number: [id number] Date of Birth/Sex: Treating RN: [birth year]  (52 y.o. [name] Primary Care Erik Burkett: [name] Other Clinician: Referring Jahne Krukowski: Treating Myles Tavella/Extender: Jeananne Rama, Mayuri Weeks in Treatment: 2 Vital Signs Time Taken: 13:39 Temperature (F): 97.6 Height (in): 77 Pulse (bpm): 99 Weight (lbs): 240 Respiratory Rate (breaths/min): 18  Body Mass Index (BMI): 28.5 Blood Pressure (mmHg): 168/94 Reference Range: 80 - 120 mg / dl Electronic Signature(s) Signed: 12/22/2022 4:46:41 PM By: Dellie Catholic RN Entered By: Dellie Catholic on 12/22/2022 13:40:23

## 2022-12-23 ENCOUNTER — Encounter (HOSPITAL_COMMUNITY)
Admission: RE | Admit: 2022-12-23 | Discharge: 2022-12-23 | Disposition: A | Discharge status: Home / Self Care | Payer: 59 | Source: Ambulatory Visit | Attending: Nephrology | Admitting: Nephrology

## 2022-12-23 VITALS — BP 161/85 | HR 106 | Temp 97.3°F

## 2022-12-23 DIAGNOSIS — N179 Acute kidney failure, unspecified: Secondary | ICD-10-CM

## 2022-12-23 DIAGNOSIS — D62 Acute posthemorrhagic anemia: Secondary | ICD-10-CM

## 2022-12-23 DIAGNOSIS — D649 Anemia, unspecified: Secondary | ICD-10-CM

## 2022-12-23 LAB — POCT HEMOGLOBIN-HEMACUE: Hemoglobin: 8.6 g/dL — ABNORMAL LOW (ref 13.0–17.0)

## 2022-12-23 MED ORDER — EPOETIN ALFA-EPBX 10000 UNIT/ML IJ SOLN
10000.0000 [IU] | INTRAMUSCULAR | Status: DC
  Administered 2022-12-23: 10000 [IU] via SUBCUTANEOUS

## 2022-12-24 ENCOUNTER — Encounter (HOSPITAL_COMMUNITY): Payer: 59

## 2022-12-27 DIAGNOSIS — J189 Pneumonia, unspecified organism: Secondary | ICD-10-CM | POA: Insufficient documentation

## 2022-12-28 DIAGNOSIS — N185 Chronic kidney disease, stage 5: Secondary | ICD-10-CM | POA: Insufficient documentation

## 2022-12-28 DIAGNOSIS — S92422G Displaced fracture of distal phalanx of left great toe, subsequent encounter for fracture with delayed healing: Secondary | ICD-10-CM | POA: Insufficient documentation

## 2022-12-28 DIAGNOSIS — L97529 Non-pressure chronic ulcer of other part of left foot with unspecified severity: Secondary | ICD-10-CM | POA: Insufficient documentation

## 2022-12-28 NOTE — Telephone Encounter (Signed)
Attempted to contact patient to offer surgery date with later arrival time, but noted patient admitted to [facility] on 12/27/22 with pneumonia.

## 2022-12-29 ENCOUNTER — Encounter (HOSPITAL_BASED_OUTPATIENT_CLINIC_OR_DEPARTMENT_OTHER): Payer: 59 | Admitting: General Surgery

## 2022-12-31 ENCOUNTER — Encounter (HOSPITAL_COMMUNITY): Payer: 59

## 2023-01-01 DIAGNOSIS — F4320 Adjustment disorder, unspecified: Secondary | ICD-10-CM | POA: Insufficient documentation

## 2023-01-04 ENCOUNTER — Telehealth: Payer: Self-pay

## 2023-01-04 ENCOUNTER — Encounter (HOSPITAL_BASED_OUTPATIENT_CLINIC_OR_DEPARTMENT_OTHER): Payer: 59 | Attending: General Surgery | Admitting: General Surgery

## 2023-01-04 NOTE — Telephone Encounter (Signed)
Called pt to schedule his surgery. Left voice mail for him to call us back.

## 2023-01-04 NOTE — Transitions of Care (Post Inpatient/ED Visit) (Signed)
01/04/2023  Name: [name] [name] MRN: [id number] DOB: [birth year]  Today's TOC FU Call Status: Today's TOC FU Call Status:: Unsuccessul Call (1st Attempt) Unsuccessful Call (1st Attempt) Date: 01/04/23  Attempted to reach the patient regarding the most recent Inpatient/ED visit.  Follow Up Plan: Additional outreach attempts will be made to reach the patient to complete the Transitions of Care (Post Inpatient/ED visit) call.   Signature Karena Addison, LPN [facility] Nurse Health Advisor Direct Dial [phone]

## 2023-01-05 NOTE — Telephone Encounter (Signed)
Left message for patient to return call. Will send UTR letter to contact office to schedule surgery.

## 2023-01-05 NOTE — Transitions of Care (Post Inpatient/ED Visit) (Signed)
   01/05/2023  Name: LINKEN MCGLOTHEN MRN: [id number] DOB: [birth year]  Today's TOC FU Call Status: Today's TOC FU Call Status:: Successful TOC FU Call Competed Unsuccessful Call (1st Attempt) Date: 01/04/23 [facility] FU Call Complete Date: 01/05/23  Transition Care Management Follow-up Telephone Call Date of Discharge: 01/01/23 Discharge Facility: Other Facility Name of Other Discharge Facility: [facility] Type of Discharge: Inpatient Admission Primary Inpatient Discharge Diagnosis:: sepsis How have you been since you were released from the hospital?: Better Any questions or concerns?: No  Items Reviewed: Did you receive and understand the discharge instructions provided?: Yes Medications obtained and verified?: Yes (Medications Reviewed) Any new allergies since your discharge?: No Dietary orders reviewed?: NA Do you have support at home?: Yes People in Home: parent(s)  Home Care and Equipment/Supplies: [place] Ordered?: NA Any new equipment or medical supplies ordered?: NA  Functional Questionnaire: Do you need assistance with bathing/showering or dressing?: No Do you need assistance with meal preparation?: No Do you need assistance with eating?: No Do you have difficulty maintaining continence: No Do you need assistance with getting out of bed/getting out of a chair/moving?: No Do you have difficulty managing or taking your medications?: No  Folllow up appointments reviewed: PCP Follow-up appointment confirmed?: No (no avail appt times, sent message to staff to schedule) MD Provider Line Number:[phone] Given: No Specialist Hospital Follow-up appointment confirmed?: Yes Date of Specialist follow-up appointment?: 01/05/23 Follow-Up Specialty Provider:: jennifer Celine Ahr - wound care Do you need transportation to your follow-up appointment?: No Do you understand care options if your condition(s) worsen?: Yes-patient verbalized understanding    SIGNATURE  Juanda Crumble, [place] Nurse Health Advisor Direct Dial [phone]

## 2023-01-06 ENCOUNTER — Encounter (HOSPITAL_COMMUNITY)
Admission: RE | Admit: 2023-01-06 | Discharge: 2023-01-06 | Disposition: A | Discharge status: Home / Self Care | Payer: 59 | Source: Ambulatory Visit | Attending: Nephrology | Admitting: Nephrology

## 2023-01-06 VITALS — BP 117/79 | HR 96 | Temp 97.9°F | Resp 20

## 2023-01-06 DIAGNOSIS — D62 Acute posthemorrhagic anemia: Secondary | ICD-10-CM | POA: Diagnosis present

## 2023-01-06 DIAGNOSIS — D649 Anemia, unspecified: Secondary | ICD-10-CM | POA: Insufficient documentation

## 2023-01-06 DIAGNOSIS — N179 Acute kidney failure, unspecified: Secondary | ICD-10-CM | POA: Insufficient documentation

## 2023-01-06 LAB — FERRITIN: Ferritin: 73 ng/mL (ref 24–336)

## 2023-01-06 LAB — IRON AND TIBC
Iron: 66 ug/dL (ref 45–182)
Saturation Ratios: 26 % (ref 17.9–39.5)
TIBC: 251 ug/dL (ref 250–450)
UIBC: 185 ug/dL

## 2023-01-06 LAB — POCT HEMOGLOBIN-HEMACUE: Hemoglobin: 7.6 g/dL — ABNORMAL LOW (ref 13.0–17.0)

## 2023-01-06 MED ORDER — EPOETIN ALFA-EPBX 10000 UNIT/ML IJ SOLN
INTRAMUSCULAR | Status: AC
  Filled 2023-01-06: qty 1

## 2023-01-06 MED ORDER — EPOETIN ALFA-EPBX 10000 UNIT/ML IJ SOLN
10000.0000 [IU] | INTRAMUSCULAR | Status: DC
  Administered 2023-01-06: 10000 [IU] via SUBCUTANEOUS

## 2023-01-06 NOTE — Progress Notes (Addendum)
Here today for retacrit.  Pt weak, fell on arrival, hemocue 7.6, stated he was released from the hospital Sunday, has to have an amputation of his toe.  Hgb on 2/9 when he left the hospital was 7.7 Called and reported the above to [employer] at [facility].  No new orders received, proceed with ordered dose of retacrit

## 2023-01-07 ENCOUNTER — Encounter (HOSPITAL_COMMUNITY): Payer: 59

## 2023-01-07 ENCOUNTER — Telehealth: Payer: Self-pay

## 2023-01-07 NOTE — Telephone Encounter (Signed)
Patient calls nurse line requesting a refill on Baclofen.   He reports his previous PCP had him taking 1 tab BID. He is requesting his current prescription of 1/2 tab BID be changed.   Will forward to PCP.

## 2023-01-08 NOTE — Telephone Encounter (Signed)
Patient LVM on nurse line regarding previous request. Attempted to call patient back and inform of provider message.   Patient did not answer, LVM asking patient to return call to office.   Talbot Grumbling, RN

## 2023-01-11 NOTE — Telephone Encounter (Signed)
Medication discussed with patient.

## 2023-01-14 DIAGNOSIS — M869 Osteomyelitis, unspecified: Secondary | ICD-10-CM | POA: Insufficient documentation

## 2023-01-14 NOTE — Addendum Note (Signed)
Addended by: Gardiner Barefoot on: 01/14/2023 08:08 AM   Modules accepted: Orders

## 2023-01-20 ENCOUNTER — Encounter (HOSPITAL_COMMUNITY): Payer: 59

## 2023-01-24 NOTE — Addendum Note (Signed)
Addended by: Edrick Kins on: 01/24/2023 12:03 PM   Modules accepted: Orders

## 2023-02-04 ENCOUNTER — Encounter: Payer: Self-pay | Admitting: Podiatry

## 2023-02-04 DIAGNOSIS — Z89412 Acquired absence of left great toe: Secondary | ICD-10-CM | POA: Insufficient documentation

## 2023-02-05 ENCOUNTER — Ambulatory Visit: Payer: Medicare Other | Admitting: Physician Assistant

## 2023-02-08 ENCOUNTER — Telehealth: Payer: Self-pay

## 2023-02-08 NOTE — Transitions of Care (Post Inpatient/ED Visit) (Signed)
02/08/2023  Name: [name] [name] MRN: [id number] DOB: May 03, 1971  Today's TOC FU Call Status: Today's TOC FU Call Status:: Unsuccessul Call (1st Attempt) Unsuccessful Call (1st Attempt) Date: 02/08/23  Attempted to reach the patient regarding the most recent Inpatient/ED visit.  Follow Up Plan: Additional outreach attempts will be made to reach the patient to complete the Transitions of Care (Post Inpatient/ED visit) call.   Signature Karena Addison, LPN [facility] Nurse Health Advisor Direct Dial [phone]

## 2023-02-09 NOTE — Transitions of Care (Post Inpatient/ED Visit) (Unsigned)
   02/09/2023  Name: [name] [name] MRN: [id number] DOB: [birth year]  Today's TOC FU Call Status: Today's TOC FU Call Status:: Unsuccessful Call (2nd Attempt) Unsuccessful Call (1st Attempt) Date: 02/08/23 Unsuccessful Call (2nd Attempt) Date: 02/09/23 [facility] FU Call Complete Date: 02/06/23  Attempted to reach the patient regarding the most recent Inpatient/ED visit.  Follow Up Plan: Additional outreach attempts will be made to reach the patient to complete the Transitions of Care (Post Inpatient/ED visit) call.   Signature Juanda Crumble, [place] Direct Dial [phone]

## 2023-02-10 NOTE — Progress Notes (Deleted)
Cardiology Office Note Date:  10/07/2022  Patient ID:  Kelin, Borum Aug 26, 1971, MRN [id number] PCP:  [name], NP  Cardiologist:  Dr. Angelena Form Electrophysiologist: Dr. Curt Bears Nephrology: [facility] End[facility]ology: Otho Bellows, FNP     Chief Complaint:  *** 4 mo History of Present Illness: JADRIEN NARINE is a 52 y.o. 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 Angelena Form April 2023 for palpitations, planned for monitoring and an echo  Monitor noted an SVT and recommended increasing his metoprolol and EP referral for possible ablation Turns out his PMD had already increased his metoprolol and no further changes made.  Echo noted preserved LVEF no significant VHD  He saw Dr. Curt Bears 05/19/22, aware of on/off fast rates, otherwise no particular symptoms reported.  He wanted to try and avoid procedures, started on diltiazem, stopped his metoprolol.  ER visit 06/25/22 for a cyst/swelling L chest wall, (reportedly drained some years prior). Creat worse, discussed with his nepgrologist, Dr. Royce Macadamia who has been seeing him regularly and likely pending need for HD, though if lytes were ok, no need to admit. CT chest noted mass/lesion, (not new noted on prior CTs) and radiology recommended MRI to better characterize. With some erythema given a course of antibiotics and discharged with close f/u with renal especially and PMD to discuss f/u on mass/MRI recs.  I saw him 06/30/22 His palpitations are less often/shorter but not gone. Mostly noted in the evenings in bed. They can last a couple minutes, he is aware of the fast rate but not particularly symptomatic otherwise. No CP No near syncope or syncope. No SOB While here his Dexcom alerts to BS of 67 and declining but feeling OK. He was given a cola and graham crackers with an upwards trend  after a couple minutes, still feeling well He follows closely with endo, has a insulin pump, infrequently with low readings His diltiazem was changed to PM dosing given palpitations seemed most often to bother him in the evenings.  Diltiazem was ultimately increased to 300mg  with ongoing palpitations  Admitted 09/10/22 for acute hypoxic respiratory failure, found with Rhinovirus Given Abx, steroid taper Discharged 09/12/22 LABS 09/12/22 K+ 4.0 BUN/Creat 77/5.52 (baseline looks 3-4's > 5) WBC 10.2 H/H 8.4/27  Baseline Hgb looks about  9-10) Plts 192  I saw him 10/07/22 He is doing better. Initially denies any ongoing palpitations, though towards the end of our visit, again mentions palpitations/fast rates when in bed at night, lasting several minutes with a sense of racing beats.   No other associated symptoms No CP, denies SOB No dizzy spells, near syncope or syncope. He reports some edema since his hospital stay, doesn't think the Flomax works well for him.  C/w + urine OP. He reports that he takes lasix BID (not daily), has been for several months at the direction of Dr. Royce Macadamia He follows with Dr. Royce Macadamia, in December, there have been discussions he says about dialysis, though not a final decision/recommendation yet. With most palpitations in the evenings, advised to move his dilt to evening time. I advised he reach out to nephrology for his edema.  Admitted [facility], 12/27/22 for nonhealing wound, increased drainage, progressive pain as well as SOB, cough He was febrile, WBC elevated  found with RLL PNA, Diabetic left foot ulcer/osteomyelitis first metatarsal, sesamoid, septic arthritis of first MTP and large soft tissue abscess by  MRI on 12/29/2022, posiatry and ID consulted, pt refused surgical intervention, despite counseling on need, eventually psych consulted and found to have capacity to make his own decisions.   Discharged 01/01/23 on ciprofloxacin, doxycycline and Flagyl   Admitted  again to [facility] 01/14/23, L foot osteo, underwent partial ray amputation on 01/16/2023 discharged 01/20/23 on ciprofloxacin and Zyvox  Note reports Creat stayed about his baseline A1c 8.7  Saw ID 02/04/23, incisions looked good, planned to continue the planned 3 weeks course of antibiotics ( to 02/06/23)and no further ID needed. Scheduled w/podiatry 03/04/23  02/12/23: ER visit with near syncope, EKG was reported as sinus, Creat 7.2 > 6.1 after IVF, reportedly with poor intake of late, chronically abnormla labs otherwise, Discharged from ER  02/15/23 ER visit with *** tachycardia  *** palps? *** nephrology?  HD? *** foot?   Past Medical History:  Diagnosis Date   Allergy    seasonal and environmental   Anemia    ARF (acute renal failure) ([place]) 09/2017   Chronic constipation 05/13/2020   CKD (chronic kidney disease) stage 4, GFR 15-29 ml/min (HCC)    Depression    Diabetes mellitus without complication ([place]) [id number]   GERD (gastroesophageal reflux disease)    Hyperlipidemia    Hypertension    Necrotizing fasciitis ([place]) 10/13/2017   Neuromuscular disorder (HCC)    neuropathy   PAD (peripheral artery disease) ([place])    Secondary hyperparathyroidism ([place])    Wound dehiscence 11/24/2017    Past Surgical History:  Procedure Laterality Date   AMPUTATION Right 10/13/2017   Procedure: RIGHT BELOW KNEE AMPUTATION;  Surgeon: [name], MD;  Location: [facility];  Service: Orthopedics;  Laterality: Right;   AMPUTATION Right 11/24/2017   Procedure: AMPUTATION BELOW KNEE REVISION;  Surgeon: [name], MD;  Location: [facility];  Service: Orthopedics;  Laterality: Right;   BIOPSY  09/02/2021   Procedure: BIOPSY;  Surgeon: Sharyn Creamer, MD;  Location: [facility] ENDOSCOPY;  Service: Gastroenterology;;   COLONOSCOPY  05/11/2019   ESOPHAGOGASTRODUODENOSCOPY (EGD) WITH PROPOFOL N/A 09/02/2021   Procedure: ESOPHAGOGASTRODUODENOSCOPY (EGD) WITH PROPOFOL;  Surgeon: Sharyn Creamer, MD;  Location: [facility];   Service: Gastroenterology;  Laterality: N/A;   NO PAST SURGERIES     POLYPECTOMY     WISDOM TOOTH EXTRACTION      Current Outpatient Medications  Medication Sig Dispense Refill   AGAMATRIX ULTRA-THIN LANCETS MISC Check blood sugars before meals twice daily 100 each 11   ARIPiprazole (ABILIFY) 20 MG tablet Take 20 mg by mouth daily.     atorvastatin (LIPITOR) 40 MG tablet Take 40 mg by mouth at bedtime.     baclofen (LIORESAL) 10 MG tablet 1/2 tab twice a day (Patient taking differently: Take 10 mg by mouth in the morning and at bedtime. Muscle spasms) 30 each 6   calcitRIOL (ROCALTROL) 0.5 MCG capsule Take 0.5 mcg by mouth daily.     diltiazem (CARDIZEM CD) 300 MG 24 hr capsule Take 1 capsule (300 mg total) by mouth at bedtime. 30 capsule 2   FARXIGA 10 MG TABS tablet Take 10 mg by mouth daily.     furosemide (LASIX) 40 MG tablet Take 40 mg by mouth daily.     gabapentin (NEURONTIN) 300 MG capsule Take 300 mg by mouth 2 (two) times daily. Pt takes 300 mg 1 tablet in the morning, and 300 mg 1 tablet in the afternoon.     glucose blood (AGAMATRIX PRESTO TEST) test strip Check blood sugars  twice daily before meals 100 each 12   glucose blood test strip 1 each by Other route as needed for other. Use as instructed     hydrALAZINE (APRESOLINE) 50 MG tablet Take 50 mg by mouth in the morning and at bedtime.     Insulin Disposable Pump (OMNIPOD DASH PODS, GEN 4,) MISC SMARTSIG:SUB-Q Every 3 Days     insulin lispro (HUMALOG) 100 UNIT/ML injection Use as directed via insulin pump. MAX TDD 30 units     ondansetron (ZOFRAN) 4 MG tablet Take 1 tablet (4 mg total) by mouth 3 (three) times daily as needed for nausea or vomiting. 30 tablet 0   pantoprazole (PROTONIX) 40 MG tablet Take 1 tablet (40 mg total) by mouth 2 (two) times daily. 60 tablet 2   tamsulosin (FLOMAX) 0.4 MG CAPS capsule Take 0.4 mg by mouth as needed (misc. genitourinary products).     No current facility-administered medications for  this visit.    Allergies:   Iron and Tape   Social History:  The patient  reports that he has been smoking cigarettes. He has a 17.50 pack-year smoking history. He has never used smokeless tobacco. He reports that he does not drink alcohol and does not use drugs.   Family History:  The patient's family history includes Colon polyps in his mother; Depression in his mother; Diabetes Mellitus II in his mother; Heart disease in his father.  ROS:  Please see the history of present illness.    All other systems are reviewed and otherwise negative.   PHYSICAL EXAM:  VS:  BP 138/86   Pulse 100   Ht 6\' 5"  (1.956 m)   Wt 248 lb 12.8 oz (112.9 kg)   SpO2 98%   BMI 29.50 kg/m  BMI: Body mass index is 29.5 kg/m. Well nourished, well developed, in no acute distress HEENT: normocephalic, atraumatic Neck: no JVD, carotid bruits or masses Cardiac: *** RRR; no significant murmurs, no rubs, or gallops Lungs: *** CTA b/l, no wheezing, rhonchi or rales Abd: soft, nontender MS: RLE prosthesis [id number] *** 1+ edema LLE to below mid shin, chronic/scaling looking skin changes Skin: warm and dry, no rash Neuro:  No gross deficits appreciated Psych: euthymic mood, full affect    EKG:  done today and reviewed by myself ***  May 2023: monitor Sinus rhythm. Minimum heart rate of 59 bpm. Maximum heart rate of 171 bpm.  1 run of Ventricular Tachycardia occurred lasting 5 beats  47 Supraventricular Tachycardia  runs occurred, the longest lasting 2 mins 8 seconds. Supraventricular Tachycardia was detected within +/- 45 seconds of symptomatic patient event(s). Rare Premature atrial contractions (<1%) Rare premature ventricular contractions. (<1.0%, 1357)  03/31/2022: TTE 1. Left ventricular ejection fraction, by estimation, is 60 to 65%. The  left ventricle has normal function. The left ventricle has no regional  wall motion abnormalities. There is mild left ventricular hypertrophy.  Left ventricular  diastolic parameters  were normal.   2. Right ventricular systolic function is normal. The right ventricular  size is normal. Tricuspid regurgitation signal is inadequate for assessing  PA pressure.   3. The mitral valve is normal in structure. No evidence of mitral valve  regurgitation. No evidence of mitral stenosis.   4. The aortic valve has an indeterminant number of cusps. Aortic valve  regurgitation is not visualized. No aortic stenosis is present.   5. The inferior vena cava is normal in size with greater than 50%  respiratory variability, suggesting right atrial  pressure of 3 mmHg.   Comparison(s): No prior Echocardiogram.    Recent Labs: 09/10/2022: B Natriuretic Peptide 33.0 09/12/2022: BUN 77; Creatinine, Ser 5.52; Platelets 192; Potassium 4.9; Sodium 143 10/01/2022: Hemoglobin 8.4  No results found for requested labs within last 365 days.   CrCl cannot be calculated (Patient's most recent lab result is older than the maximum 21 days allowed.).   Wt Readings from Last 3 Encounters:  10/07/22 248 lb 12.8 oz (112.9 kg)  09/10/22 247 lb (112 kg)  06/30/22 240 lb (108.9 kg)     Other studies reviewed: Additional studies/records reviewed today include: summarized above  ASSESSMENT AND PLAN:  SVT *** Improved, not resolved ***  2. Edema *** I have advised him to reach out to Dr. Royce Macadamia   Disposition: *** .  Current medicines are reviewed at length with the patient today.  The patient did not have any concerns regarding medicines.  Venetia Night, PA-C 10/07/2022 12:41 PM     [place] [id number] [phone] (office)  [phone] (fax)

## 2023-02-10 NOTE — Transitions of Care (Post Inpatient/ED Visit) (Signed)
   02/10/2023  Name: [name] [name] MRN: [id number] DOB: [birth year]  Today's TOC FU Call Status: Today's TOC FU Call Status:: Successful TOC FU Call Competed Unsuccessful Call (1st Attempt) Date: 02/08/23 Unsuccessful Call (2nd Attempt) Date: 02/09/23 [facility] FU Call Complete Date: 02/10/23  Transition Care Management Follow-up Telephone Call Date of Discharge: 02/06/23 Discharge Facility: Other ([facility]) Name of Other (Non-[facility]) Discharge Facility: [facility] Type of Discharge: Inpatient Admission Primary Inpatient Discharge Diagnosis:: chronic kidney disease How have you been since you were released from the hospital?: Same Any questions or concerns?: No  Items Reviewed: Did you receive and understand the discharge instructions provided?: Yes Medications obtained and verified?: Yes (Medications Reviewed) Any new allergies since your discharge?: No Dietary orders reviewed?: Yes Do you have support at home?: Yes People in Home: sibling(s)  Home Care and Equipment/Supplies: [facility] Ordered?: Yes Name of [place]:: [place] set up a time to come to your home?: No EMR reviewed for [place] Orders: Orders present/patient has not received call (refer to CM for follow-up) Any new equipment or medical supplies ordered?: NA  Functional Questionnaire: Do you need assistance with bathing/showering or dressing?: No Do you need assistance with meal preparation?: No Do you need assistance with eating?: No Do you have difficulty maintaining continence: No Do you need assistance with getting out of bed/getting out of a chair/moving?: No Do you have difficulty managing or taking your medications?: No  Follow up appointments reviewed: PCP Follow-up appointment confirmed?: Yes MD Provider Line Number:[phone] Given: No Date of PCP follow-up appointment?: 02/16/23 Follow-up Provider: Dr Jinny Sanders [facility] Follow-up  appointment confirmed?: NA Do you need transportation to your follow-up appointment?: No Do you understand care options if your condition(s) worsen?: Yes-patient verbalized understanding    [place], [place] Nurse Health Advisor Direct Dial [phone]

## 2023-02-12 ENCOUNTER — Ambulatory Visit: Payer: 59 | Admitting: Student

## 2023-02-12 ENCOUNTER — Encounter (HOSPITAL_COMMUNITY): Payer: Self-pay

## 2023-02-12 ENCOUNTER — Emergency Department (HOSPITAL_COMMUNITY)
Admission: EM | Admit: 2023-02-12 | Discharge: 2023-02-12 | Disposition: A | Discharge status: Home / Self Care | Payer: 59 | Attending: Emergency Medicine | Admitting: Emergency Medicine

## 2023-02-12 ENCOUNTER — Other Ambulatory Visit: Payer: Self-pay

## 2023-02-12 ENCOUNTER — Emergency Department (HOSPITAL_COMMUNITY): Payer: 59

## 2023-02-12 DIAGNOSIS — R7309 Other abnormal glucose: Secondary | ICD-10-CM | POA: Diagnosis not present

## 2023-02-12 DIAGNOSIS — R55 Syncope and collapse: Secondary | ICD-10-CM | POA: Diagnosis not present

## 2023-02-12 DIAGNOSIS — R531 Weakness: Secondary | ICD-10-CM | POA: Diagnosis present

## 2023-02-12 DIAGNOSIS — N186 End stage renal disease: Secondary | ICD-10-CM | POA: Diagnosis not present

## 2023-02-12 DIAGNOSIS — D509 Iron deficiency anemia, unspecified: Secondary | ICD-10-CM | POA: Insufficient documentation

## 2023-02-12 LAB — CBC WITH DIFFERENTIAL/PLATELET
Abs Immature Granulocytes: 0.08 10*3/uL — ABNORMAL HIGH (ref 0.00–0.07)
Basophils Absolute: 0 10*3/uL (ref 0.0–0.1)
Basophils Relative: 0 %
Eosinophils Absolute: 0.1 10*3/uL (ref 0.0–0.5)
Eosinophils Relative: 1 %
HCT: 24.1 % — ABNORMAL LOW (ref 39.0–52.0)
Hemoglobin: 7.3 g/dL — ABNORMAL LOW (ref 13.0–17.0)
Immature Granulocytes: 1 %
Lymphocytes Relative: 16 %
Lymphs Abs: 1.6 10*3/uL (ref 0.7–4.0)
MCH: 25.9 pg — ABNORMAL LOW (ref 26.0–34.0)
MCHC: 30.3 g/dL (ref 30.0–36.0)
MCV: 85.5 fL (ref 80.0–100.0)
Monocytes Absolute: 1 10*3/uL (ref 0.1–1.0)
Monocytes Relative: 9 %
Neutro Abs: 7.5 10*3/uL (ref 1.7–7.7)
Neutrophils Relative %: 73 %
Platelets: 110 10*3/uL — ABNORMAL LOW (ref 150–400)
RBC: 2.82 MIL/uL — ABNORMAL LOW (ref 4.22–5.81)
RDW: 14.6 % (ref 11.5–15.5)
WBC: 10.4 10*3/uL (ref 4.0–10.5)
nRBC: 0 % (ref 0.0–0.2)

## 2023-02-12 LAB — I-STAT CHEM 8, ED
BUN: 61 mg/dL — ABNORMAL HIGH (ref 6–20)
Calcium, Ion: 1.12 mmol/L — ABNORMAL LOW (ref 1.15–1.40)
Chloride: 96 mmol/L — ABNORMAL LOW (ref 98–111)
Creatinine, Ser: 7.2 mg/dL — ABNORMAL HIGH (ref 0.61–1.24)
Glucose, Bld: 221 mg/dL — ABNORMAL HIGH (ref 70–99)
HCT: 22 % — ABNORMAL LOW (ref 39.0–52.0)
Hemoglobin: 7.5 g/dL — ABNORMAL LOW (ref 13.0–17.0)
Potassium: 3.8 mmol/L (ref 3.5–5.1)
Sodium: 137 mmol/L (ref 135–145)
TCO2: 26 mmol/L (ref 22–32)

## 2023-02-12 LAB — COMPREHENSIVE METABOLIC PANEL
ALT: 14 U/L (ref 0–44)
AST: 18 U/L (ref 15–41)
Albumin: 3.3 g/dL — ABNORMAL LOW (ref 3.5–5.0)
Alkaline Phosphatase: 73 U/L (ref 38–126)
Anion gap: 20 — ABNORMAL HIGH (ref 5–15)
BUN: 62 mg/dL — ABNORMAL HIGH (ref 6–20)
CO2: 23 mmol/L (ref 22–32)
Calcium: 8.9 mg/dL (ref 8.9–10.3)
Chloride: 93 mmol/L — ABNORMAL LOW (ref 98–111)
Creatinine, Ser: 6.11 mg/dL — ABNORMAL HIGH (ref 0.61–1.24)
GFR, Estimated: 10 mL/min — ABNORMAL LOW (ref >60)
Glucose, Bld: 223 mg/dL — ABNORMAL HIGH (ref 70–99)
Potassium: 3.7 mmol/L (ref 3.5–5.1)
Sodium: 136 mmol/L (ref 135–145)
Total Bilirubin: 0.6 mg/dL (ref 0.3–1.2)
Total Protein: 6.7 g/dL (ref 6.5–8.1)

## 2023-02-12 LAB — TROPONIN I (HIGH SENSITIVITY)
Troponin I (High Sensitivity): 19 ng/L — ABNORMAL HIGH (ref <18)
Troponin I (High Sensitivity): 19 ng/L — ABNORMAL HIGH (ref <18)

## 2023-02-12 LAB — LIPASE, BLOOD: Lipase: 111 U/L — ABNORMAL HIGH (ref 11–51)

## 2023-02-12 LAB — MAGNESIUM: Magnesium: 1.8 mg/dL (ref 1.7–2.4)

## 2023-02-12 LAB — CBG MONITORING, ED: Glucose-Capillary: 269 mg/dL — ABNORMAL HIGH (ref 70–99)

## 2023-02-12 MED ORDER — LACTATED RINGERS IV BOLUS
1000.0000 mL | Freq: Once | INTRAVENOUS | Status: AC
  Administered 2023-02-12: 1000 mL via INTRAVENOUS

## 2023-02-12 NOTE — ED Triage Notes (Addendum)
Pt BIB [facility] from [facility] after near syncopal episode where patient fell and landed on his back after his leg twisting. Patient reports no LOC, only weakness in his legs. Recently discharged from rehab after toe amputation, also has AKA on right side. Hx of ESRD Stage V, no dialysis, intermittent nausea and vomiting x2 months. Pt Hr 140s w/ EMS, no distress noted at this time, other VS stable.

## 2023-02-12 NOTE — ED Provider Notes (Signed)
[facility] Provider Note   CSN: [id number] Arrival date & time: 02/12/23  1657     History Chief Complaint  Patient presents with   Near Syncope    HPI DEKENDRICK SLIGH is a 52 y.o. male presenting for chief complaint of weakness episode.  He is a 52 year old male with an extensive medical history just out of rehab 4 days ago.  He states that he was running errands today and was out and about when he became suddenly weak and had to lay down on the ground.  EMS was called by bystanders.  He denies syncope.  Denies fevers chills nausea vomiting syncope shortness of breath no other symptoms.  Recent hospitalization for extensive osteomyelitis status post surgical correction.  Denies any worsening symptoms from this point. Otherwise ambulatory tolerating p.o. intake.  States that he did not drink any water today.  States that he has known ESRD but does not want to go on dialysis.  He does state that he understands end-of-life possibility given these limitations.   Patient's recorded medical, surgical, social, medication list and allergies were reviewed in the Snapshot window as part of the initial history.   Review of Systems   Review of Systems  Constitutional:  Negative for chills and fever.  HENT:  Negative for ear pain and sore throat.   Eyes:  Negative for pain and visual disturbance.  Respiratory:  Negative for cough and shortness of breath.   Cardiovascular:  Negative for chest pain and palpitations.  Gastrointestinal:  Negative for abdominal pain and vomiting.  Genitourinary:  Negative for dysuria and hematuria.  Musculoskeletal:  Negative for arthralgias and back pain.  Skin:  Negative for color change and rash.  Neurological:  Positive for weakness. Negative for seizures and syncope.  All other systems reviewed and are negative.   Physical Exam Updated Vital Signs BP 136/84   Pulse 94   Temp 98.6 F (37 C) (Oral)   Resp  19   SpO2 97%  Physical Exam Vitals and nursing note reviewed.  Constitutional:      General: He is not in acute distress.    Appearance: He is well-developed.  HENT:     Head: Normocephalic and atraumatic.  Eyes:     Conjunctiva/sclera: Conjunctivae normal.  Cardiovascular:     Rate and Rhythm: Normal rate and regular rhythm.     Heart sounds: No murmur heard. Pulmonary:     Effort: Pulmonary effort is normal. No respiratory distress.     Breath sounds: Normal breath sounds.  Abdominal:     Palpations: Abdomen is soft.     Tenderness: There is no abdominal tenderness.  Musculoskeletal:        General: Deformity (Right BKA.  Left foot inside a postoperative splint.) present. No swelling.     Cervical back: Neck supple.  Skin:    General: Skin is warm and dry.     Capillary Refill: Capillary refill takes less than 2 seconds.  Neurological:     Mental Status: He is alert.  Psychiatric:        Mood and Affect: Mood normal.      ED Course/ Medical Decision Making/ A&P Clinical Course as of 02/12/23 2016  Fri Feb 12, 2023  2009 Trops and labs all at baseline [CC]    Clinical Course User Index [CC] Tretha Sciara, MD    Procedures Procedures   Medications Ordered in ED Medications  lactated ringers bolus 1,000  mL (0 mLs Intravenous Stopped 02/12/23 1853)    Medical Decision Making:    DILAN NETRO is a 52 y.o. male who presented to the ED today with episode of weakness today detailed above.     Handoff received from EMS.  Patient's presentation is complicated by their history of multiple comorbid medical problems.  Patient placed on continuous vitals and telemetry monitoring while in ED which was reviewed periodically.   Complete initial physical exam performed, notably the patient  was hemodynamically stable.  Grossly tachycardic initially at 144 though this corrected rapidly with 1 L IV fluids to 94.      Reviewed and confirmed nursing documentation  for past medical history, family history, social history.    Initial Assessment:   With the patient's presentation of near syncope episode in the context of poor p.o. intake today no fluid intake and known ESRD, most likely diagnosis is severe dehydration. Other diagnoses were considered including (but not limited to) primary cardiac abnormality, sepsis, infection, urinary tract infection, pneumothorax, metabolic emergency. These are considered less likely due to history of present illness and physical exam findings.   This is most consistent with an acute life/limb threatening illness complicated by underlying chronic conditions.  Initial Plan:  Screening labs including CBC and Metabolic panel to evaluate for infectious or metabolic etiology of disease.  Urinalysis with reflex culture ordered to evaluate for UTI or relevant urologic/nephrologic pathology.  CXR to evaluate for structural/infectious intrathoracic pathology.  Troponin/EKG to evaluate for cardiac pathology. Objective evaluation as below reviewed with plan for close reassessment  Initial Study Results:   Laboratory  Multiple abnormal labs.  Patient's creatinine initially 7.2 though this improved to 6.1 with IV fluids which is close to patient's baseline.  BUN is elevated though also at baseline, troponin and lipase both chronically elevated.  Patient chronically anemic and microcytic, glucose chronically elevated  EKG EKG was reviewed independently. Rate, rhythm, axis, intervals all examined and without medically relevant abnormality. ST segments without concerns for elevations.    Radiology  All images reviewed independently. Agree with radiology report at this time.   DG Chest Portable 1 View  Result Date: 02/12/2023 CLINICAL DATA:  sob EXAM: PORTABLE CHEST 1 VIEW COMPARISON:  Chest x-ray 01/14/2023, CT chest 09/09/2022 FINDINGS: The heart and mediastinal contours are unchanged. Low lung volumes. No focal consolidation.  Slightly coarsened interstitial markings most prominent within the lower lobes. No pleural effusion. No pneumothorax. No acute osseous abnormality. IMPRESSION: Bilateral lower lobe bronchitic changes. Electronically Signed   By: Iven Finn M.D.   On: 02/12/2023 17:49     Final Assessment and Plan:   Vital signs returned within normal limits after IV fluids. Burtis Junes that patient had severe dehydration today especially given improving creatinine after IV fluids. Extensive medical decision making with patient.  He is approaching renal failure with his late stage CKD.  He has a follow-up appointment with a nephrologist in the outpatient setting soon.  Symptoms have resolved today.  He is overall improved after IV fluids and has an otherwise reassuring workup without signs of severe infection or emergent pathology.  Given resolution of symptoms overall gross improvement, there is no indication for further intervention in the emergency room..   Disposition:  I have considered need for hospitalization, however, considering all of the above, I believe this patient is stable for discharge at this time.  Patient/family educated about specific return precautions for given chief complaint and symptoms.  Patient/family educated about follow-up with  PCP.     Patient/family expressed understanding of return precautions and need for follow-up. Patient spoken to regarding all imaging and laboratory results and appropriate follow up for these results. All education provided in verbal form with additional information in written form. Time was allowed for answering of patient questions. Patient discharged.    Emergency Department Medication Summary:   Medications  lactated ringers bolus 1,000 mL (0 mLs Intravenous Stopped 02/12/23 1853)         Clinical Impression:  1. Near syncope   2. Weakness      Data Unavailable   Final Clinical Impression(s) / ED Diagnoses Final diagnoses:  Near syncope   Weakness    Rx / DC Orders ED Discharge Orders     None         Tretha Sciara, MD 02/12/23 2018

## 2023-02-15 ENCOUNTER — Encounter: Payer: Self-pay | Admitting: Podiatry

## 2023-02-15 ENCOUNTER — Inpatient Hospital Stay (HOSPITAL_COMMUNITY)
Admission: EM | Admit: 2023-02-15 | Discharge: 2023-02-18 | DRG: 309 | Disposition: A | Discharge status: Home / Self Care | Payer: 59 | Attending: Family Medicine | Admitting: Family Medicine

## 2023-02-15 ENCOUNTER — Other Ambulatory Visit: Payer: Self-pay

## 2023-02-15 ENCOUNTER — Encounter (HOSPITAL_COMMUNITY): Payer: Self-pay

## 2023-02-15 ENCOUNTER — Emergency Department (HOSPITAL_COMMUNITY): Payer: 59

## 2023-02-15 DIAGNOSIS — D631 Anemia in chronic kidney disease: Secondary | ICD-10-CM | POA: Diagnosis present

## 2023-02-15 DIAGNOSIS — Z9641 Presence of insulin pump (external) (internal): Secondary | ICD-10-CM | POA: Diagnosis present

## 2023-02-15 DIAGNOSIS — Z7984 Long term (current) use of oral hypoglycemic drugs: Secondary | ICD-10-CM

## 2023-02-15 DIAGNOSIS — I10 Essential (primary) hypertension: Secondary | ICD-10-CM | POA: Diagnosis present

## 2023-02-15 DIAGNOSIS — M79672 Pain in left foot: Secondary | ICD-10-CM | POA: Insufficient documentation

## 2023-02-15 DIAGNOSIS — Z794 Long term (current) use of insulin: Secondary | ICD-10-CM

## 2023-02-15 DIAGNOSIS — E1151 Type 2 diabetes mellitus with diabetic peripheral angiopathy without gangrene: Secondary | ICD-10-CM | POA: Diagnosis present

## 2023-02-15 DIAGNOSIS — Z560 Unemployment, unspecified: Secondary | ICD-10-CM

## 2023-02-15 DIAGNOSIS — R0602 Shortness of breath: Secondary | ICD-10-CM | POA: Diagnosis present

## 2023-02-15 DIAGNOSIS — K2289 Other specified disease of esophagus: Secondary | ICD-10-CM

## 2023-02-15 DIAGNOSIS — K449 Diaphragmatic hernia without obstruction or gangrene: Secondary | ICD-10-CM | POA: Diagnosis present

## 2023-02-15 DIAGNOSIS — Z8349 Family history of other endocrine, nutritional and metabolic diseases: Secondary | ICD-10-CM

## 2023-02-15 DIAGNOSIS — Z833 Family history of diabetes mellitus: Secondary | ICD-10-CM

## 2023-02-15 DIAGNOSIS — I4719 Other supraventricular tachycardia: Secondary | ICD-10-CM | POA: Diagnosis not present

## 2023-02-15 DIAGNOSIS — Z83719 Family history of colon polyps, unspecified: Secondary | ICD-10-CM

## 2023-02-15 DIAGNOSIS — K2100 Gastro-esophageal reflux disease with esophagitis, without bleeding: Secondary | ICD-10-CM

## 2023-02-15 DIAGNOSIS — Z89511 Acquired absence of right leg below knee: Secondary | ICD-10-CM

## 2023-02-15 DIAGNOSIS — Z9109 Other allergy status, other than to drugs and biological substances: Secondary | ICD-10-CM

## 2023-02-15 DIAGNOSIS — I120 Hypertensive chronic kidney disease with stage 5 chronic kidney disease or end stage renal disease: Secondary | ICD-10-CM | POA: Diagnosis present

## 2023-02-15 DIAGNOSIS — Z8249 Family history of ischemic heart disease and other diseases of the circulatory system: Secondary | ICD-10-CM

## 2023-02-15 DIAGNOSIS — J90 Pleural effusion, not elsewhere classified: Secondary | ICD-10-CM | POA: Diagnosis present

## 2023-02-15 DIAGNOSIS — I4892 Unspecified atrial flutter: Secondary | ICD-10-CM | POA: Diagnosis present

## 2023-02-15 DIAGNOSIS — I152 Hypertension secondary to endocrine disorders: Secondary | ICD-10-CM | POA: Diagnosis present

## 2023-02-15 DIAGNOSIS — E1140 Type 2 diabetes mellitus with diabetic neuropathy, unspecified: Secondary | ICD-10-CM | POA: Diagnosis present

## 2023-02-15 DIAGNOSIS — N185 Chronic kidney disease, stage 5: Secondary | ICD-10-CM | POA: Diagnosis present

## 2023-02-15 DIAGNOSIS — E1122 Type 2 diabetes mellitus with diabetic chronic kidney disease: Secondary | ICD-10-CM | POA: Diagnosis present

## 2023-02-15 DIAGNOSIS — Z8719 Personal history of other diseases of the digestive system: Secondary | ICD-10-CM

## 2023-02-15 DIAGNOSIS — F17210 Nicotine dependence, cigarettes, uncomplicated: Secondary | ICD-10-CM | POA: Diagnosis present

## 2023-02-15 DIAGNOSIS — Z79899 Other long term (current) drug therapy: Secondary | ICD-10-CM

## 2023-02-15 DIAGNOSIS — Z888 Allergy status to other drugs, medicaments and biological substances status: Secondary | ICD-10-CM

## 2023-02-15 DIAGNOSIS — N186 End stage renal disease: Secondary | ICD-10-CM | POA: Diagnosis present

## 2023-02-15 DIAGNOSIS — I2489 Other forms of acute ischemic heart disease: Secondary | ICD-10-CM | POA: Diagnosis not present

## 2023-02-15 DIAGNOSIS — E785 Hyperlipidemia, unspecified: Secondary | ICD-10-CM | POA: Diagnosis present

## 2023-02-15 DIAGNOSIS — Z818 Family history of other mental and behavioral disorders: Secondary | ICD-10-CM

## 2023-02-15 DIAGNOSIS — N2581 Secondary hyperparathyroidism of renal origin: Secondary | ICD-10-CM | POA: Diagnosis present

## 2023-02-15 DIAGNOSIS — R000 Tachycardia, unspecified: Principal | ICD-10-CM | POA: Diagnosis present

## 2023-02-15 DIAGNOSIS — E1165 Type 2 diabetes mellitus with hyperglycemia: Secondary | ICD-10-CM

## 2023-02-15 DIAGNOSIS — D649 Anemia, unspecified: Secondary | ICD-10-CM | POA: Diagnosis present

## 2023-02-15 LAB — CBC WITH DIFFERENTIAL/PLATELET
Abs Immature Granulocytes: 0.11 10*3/uL — ABNORMAL HIGH (ref 0.00–0.07)
Basophils Absolute: 0 10*3/uL (ref 0.0–0.1)
Basophils Relative: 0 %
Eosinophils Absolute: 0.2 10*3/uL (ref 0.0–0.5)
Eosinophils Relative: 2 %
HCT: 22 % — ABNORMAL LOW (ref 39.0–52.0)
Hemoglobin: 6.8 g/dL — CL (ref 13.0–17.0)
Immature Granulocytes: 1 %
Lymphocytes Relative: 19 %
Lymphs Abs: 2 10*3/uL (ref 0.7–4.0)
MCH: 26.8 pg (ref 26.0–34.0)
MCHC: 30.9 g/dL (ref 30.0–36.0)
MCV: 86.6 fL (ref 80.0–100.0)
Monocytes Absolute: 0.8 10*3/uL (ref 0.1–1.0)
Monocytes Relative: 8 %
Neutro Abs: 7.4 10*3/uL (ref 1.7–7.7)
Neutrophils Relative %: 70 %
Platelets: 188 10*3/uL (ref 150–400)
RBC: 2.54 MIL/uL — ABNORMAL LOW (ref 4.22–5.81)
RDW: 15.1 % (ref 11.5–15.5)
WBC: 10.5 10*3/uL (ref 4.0–10.5)
nRBC: 0 % (ref 0.0–0.2)

## 2023-02-15 LAB — I-STAT CHEM 8, ED
BUN: 56 mg/dL — ABNORMAL HIGH (ref 6–20)
Calcium, Ion: 1.08 mmol/L — ABNORMAL LOW (ref 1.15–1.40)
Chloride: 110 mmol/L (ref 98–111)
Creatinine, Ser: 5.6 mg/dL — ABNORMAL HIGH (ref 0.61–1.24)
Glucose, Bld: 192 mg/dL — ABNORMAL HIGH (ref 70–99)
HCT: 19 % — ABNORMAL LOW (ref 39.0–52.0)
Hemoglobin: 6.5 g/dL — CL (ref 13.0–17.0)
Potassium: 3.8 mmol/L (ref 3.5–5.1)
Sodium: 143 mmol/L (ref 135–145)
TCO2: 22 mmol/L (ref 22–32)

## 2023-02-15 LAB — TROPONIN I (HIGH SENSITIVITY)
Troponin I (High Sensitivity): 16 ng/L (ref <18)
Troponin I (High Sensitivity): 18 ng/L — ABNORMAL HIGH (ref <18)

## 2023-02-15 LAB — COMPREHENSIVE METABOLIC PANEL
ALT: 10 U/L (ref 0–44)
AST: 14 U/L — ABNORMAL LOW (ref 15–41)
Albumin: 2.9 g/dL — ABNORMAL LOW (ref 3.5–5.0)
Alkaline Phosphatase: 68 U/L (ref 38–126)
Anion gap: 15 (ref 5–15)
BUN: 59 mg/dL — ABNORMAL HIGH (ref 6–20)
CO2: 19 mmol/L — ABNORMAL LOW (ref 22–32)
Calcium: 8.4 mg/dL — ABNORMAL LOW (ref 8.9–10.3)
Chloride: 108 mmol/L (ref 98–111)
Creatinine, Ser: 4.79 mg/dL — ABNORMAL HIGH (ref 0.61–1.24)
GFR, Estimated: 14 mL/min — ABNORMAL LOW (ref >60)
Glucose, Bld: 195 mg/dL — ABNORMAL HIGH (ref 70–99)
Potassium: 3.8 mmol/L (ref 3.5–5.1)
Sodium: 142 mmol/L (ref 135–145)
Total Bilirubin: 0.4 mg/dL (ref 0.3–1.2)
Total Protein: 6.2 g/dL — ABNORMAL LOW (ref 6.5–8.1)

## 2023-02-15 LAB — CBG MONITORING, ED: Glucose-Capillary: 163 mg/dL — ABNORMAL HIGH (ref 70–99)

## 2023-02-15 LAB — D-DIMER, QUANTITATIVE: D-Dimer, Quant: 1.53 ug/mL-FEU — ABNORMAL HIGH (ref 0.00–0.50)

## 2023-02-15 LAB — PREPARE RBC (CROSSMATCH)

## 2023-02-15 MED ORDER — ACETAMINOPHEN 650 MG RE SUPP: 650.0000 mg | Freq: Four times a day (QID) | RECTAL | Status: DC | PRN

## 2023-02-15 MED ORDER — TAMSULOSIN HCL 0.4 MG PO CAPS: 0.4000 mg | ORAL_CAPSULE | Freq: Every day | ORAL | Status: DC | PRN

## 2023-02-15 MED ORDER — LACTATED RINGERS IV BOLUS
1000.0000 mL | Freq: Once | INTRAVENOUS | Status: AC
  Administered 2023-02-15: 1000 mL via INTRAVENOUS

## 2023-02-15 MED ORDER — SODIUM CHLORIDE 0.9% IV SOLUTION: Freq: Once | INTRAVENOUS | Status: AC

## 2023-02-15 MED ORDER — ENOXAPARIN SODIUM 30 MG/0.3ML IJ SOSY
30.0000 mg | PREFILLED_SYRINGE | INTRAMUSCULAR | Status: DC
  Administered 2023-02-15: 30 mg via SUBCUTANEOUS
  Filled 2023-02-15: qty 0.3

## 2023-02-15 MED ORDER — INSULIN ASPART 100 UNIT/ML IJ SOLN
0.0000 [IU] | Freq: Three times a day (TID) | INTRAMUSCULAR | Status: DC
  Administered 2023-02-16 (×2): 2 [IU] via SUBCUTANEOUS
  Administered 2023-02-17 (×2): 1 [IU] via SUBCUTANEOUS
  Administered 2023-02-18: 2 [IU] via SUBCUTANEOUS

## 2023-02-15 MED ORDER — LACTATED RINGERS IV SOLN: INTRAVENOUS | Status: DC

## 2023-02-15 MED ORDER — HYDRALAZINE HCL 50 MG PO TABS
50.0000 mg | ORAL_TABLET | Freq: Two times a day (BID) | ORAL | Status: DC
  Administered 2023-02-15 – 2023-02-16 (×2): 50 mg via ORAL
  Filled 2023-02-15 (×2): qty 1

## 2023-02-15 MED ORDER — ATORVASTATIN CALCIUM 40 MG PO TABS
40.0000 mg | ORAL_TABLET | Freq: Every day | ORAL | Status: DC
  Administered 2023-02-15 – 2023-02-17 (×3): 40 mg via ORAL
  Filled 2023-02-15 (×3): qty 1

## 2023-02-15 MED ORDER — ACETAMINOPHEN 325 MG PO TABS
650.0000 mg | ORAL_TABLET | Freq: Four times a day (QID) | ORAL | Status: DC | PRN
  Administered 2023-02-15 – 2023-02-16 (×2): 650 mg via ORAL
  Filled 2023-02-15 (×2): qty 2

## 2023-02-15 MED ORDER — DILTIAZEM HCL ER COATED BEADS 180 MG PO CP24
300.0000 mg | ORAL_CAPSULE | Freq: Every day | ORAL | Status: DC
  Administered 2023-02-16 – 2023-02-18 (×3): 300 mg via ORAL
  Filled 2023-02-15 (×3): qty 1

## 2023-02-15 MED ORDER — GABAPENTIN 300 MG PO CAPS
300.0000 mg | ORAL_CAPSULE | Freq: Two times a day (BID) | ORAL | Status: DC
  Administered 2023-02-16: 300 mg via ORAL
  Filled 2023-02-15: qty 1

## 2023-02-15 NOTE — Assessment & Plan Note (Addendum)
A1c 8.7 in 12/2022. Follows with [facility]. Has Omnipod at home, not using currently. No longer has Dexcom, has not been checking BGL at home. Received 4 units of short acting insulin in 24 hours. -vsSSI -CBG checks

## 2023-02-15 NOTE — ED Triage Notes (Addendum)
Pt [name] from home with a sudden headache and noticed Hr "beating too fast" Hr was 150s on arrival. Happened a few days ago and got 1L of fluid and HR returned to normal.  500 NS with EMS in 20g right hand  156/90 150s hr  CBG 279

## 2023-02-15 NOTE — Assessment & Plan Note (Signed)
>>  ASSESSMENT AND PLAN FOR CHRONIC KIDNEY DISEASE, STAGE 5 (HCC) WRITTEN ON 02/18/2023  9:01 AM BY MILLER, EMILY, DO  Cr 4.79 > 4.45, baseline Cr 5-6. Pre-dialysis, planning for AVG/AVF placement but not starting HD per patient. Follows with [facility]. S/p 1L LR bolus in ED. -Avoid nephrotoxic agents -Hold home Farxiga  -F/U nephro outpatient  -Restart Lasix  40 mg -BMP tomorrow morning

## 2023-02-15 NOTE — ED Notes (Signed)
ED TO INPATIENT HANDOFF REPORT  ED Nurse Name and Phone #: Harlin Rain [phone]  S Name/Age/Gender [name] [name] 52 y.o. male Room/Bed: 042C/042C  Code Status   Code Status: Full Code  Home/SNF/Other Home Patient oriented to: self, place, time, and situation Is this baseline? Yes   Triage Complete: Triage complete  Chief Complaint SOB (shortness of breath) [R06.02]  Triage Note Pt [name] from home with a sudden headache and noticed Hr "beating too fast" Hr was 150s on arrival. Happened a few days ago and got 1L of fluid and HR returned to normal.  500 NS with EMS in 20g right hand  156/90 150s hr  CBG 279   Allergies Allergies  Allergen Reactions   Iron Nausea And Vomiting    With the iron tablets   Tape Rash    Rash at site of tape--okay to use paper tape    Level of Care/Admitting Diagnosis ED Disposition     ED Disposition  [place]: [facility] [[id number]]  Level of Care: Telemetry Medical [104]  May place patient in observation at [facility] or [place] if equivalent level of care is available:: No  Covid Evaluation: Asymptomatic - no recent exposure (last 10 days) testing not required  Diagnosis: SOB (shortness of breath) [id number]  Admitting Physician: NEAL, [place]  Attending Physician: Dickie La [id number]          B Medical/Surgery History Past Medical History:  Diagnosis Date   Allergy    seasonal and environmental   Anemia    ARF (acute renal failure) ([place]) 09/2017   Chronic constipation 05/13/2020   CKD (chronic kidney disease) stage 4, GFR 15-29 ml/min ([place])    Depression    Diabetes mellitus without complication ([place]) [id number]   GERD (gastroesophageal reflux disease)    Hyperlipidemia    Hypertension    Necrotizing fasciitis ([place]) 10/13/2017   Neuromuscular disorder ([place])    neuropathy   PAD (peripheral artery disease) ([place])    Secondary hyperparathyroidism  ([place])    Wound dehiscence 11/24/2017   Past Surgical History:  Procedure Laterality Date   AMPUTATION Right 10/13/2017   Procedure: RIGHT BELOW KNEE AMPUTATION;  Surgeon: [name], MD;  Location: [facility];  Service: Orthopedics;  Laterality: Right;   AMPUTATION Right 11/24/2017   Procedure: AMPUTATION BELOW KNEE REVISION;  Surgeon: [name], MD;  Location: [facility];  Service: Orthopedics;  Laterality: Right;   BIOPSY  09/02/2021   Procedure: BIOPSY;  Surgeon: Sharyn Creamer, MD;  Location: [facility] ENDOSCOPY;  Service: Gastroenterology;;   COLONOSCOPY  05/11/2019   ESOPHAGOGASTRODUODENOSCOPY (EGD) WITH PROPOFOL N/A 09/02/2021   Procedure: ESOPHAGOGASTRODUODENOSCOPY (EGD) WITH PROPOFOL;  Surgeon: Sharyn Creamer, MD;  Location: [facility];  Service: Gastroenterology;  Laterality: N/A;   NO PAST SURGERIES     POLYPECTOMY     WISDOM TOOTH EXTRACTION       A IV Location/Drains/Wounds Patient Lines/Drains/Airways Status     Active Line/Drains/Airways     Name Placement date Placement time Site Days   Peripheral IV 02/15/23 20 G Posterior;Right Hand 02/15/23  --  Hand  less than 1   Peripheral IV 02/15/23 20 G Anterior;Distal;Right;Upper Arm 02/15/23  1732  Arm  less than 1            Intake/Output Last 24 hours No intake or output data in the 24 hours ending 02/15/23 2152  Labs/Imaging Results for orders placed or performed during the hospital encounter of 02/15/23 (from the past 48 hour(s))  CBC with Differential/Platelet     Status: Abnormal   Collection Time: 02/15/23  5:45 PM  Result Value Ref Range   WBC 10.5 4.0 - 10.5 K/uL   RBC 2.54 (L) 4.22 - 5.81 MIL/uL   Hemoglobin 6.8 (LL) 13.0 - 17.0 g/dL    Comment: REPEATED TO VERIFY THIS CRITICAL RESULT HAS VERIFIED AND BEEN CALLED TO N [place],RN BY WALTER BOND ON 03 25 2024 AT 1854, AND HAS BEEN READ BACK.     HCT 22.0 (L) 39.0 - 52.0 %   MCV 86.6 80.0 - 100.0 fL   MCH 26.8 26.0 - 34.0 pg   MCHC 30.9 30.0 - 36.0 g/dL    RDW 15.1 11.5 - 15.5 %   Platelets 188 150 - 400 K/uL   nRBC 0.0 0.0 - 0.2 %   Neutrophils Relative % 70 %   Neutro Abs 7.4 1.7 - 7.7 K/uL   Lymphocytes Relative 19 %   Lymphs Abs 2.0 0.7 - 4.0 K/uL   Monocytes Relative 8 %   Monocytes Absolute 0.8 0.1 - 1.0 K/uL   Eosinophils Relative 2 %   Eosinophils Absolute 0.2 0.0 - 0.5 K/uL   Basophils Relative 0 %   Basophils Absolute 0.0 0.0 - 0.1 K/uL   Immature Granulocytes 1 %   Abs Immature Granulocytes 0.11 (H) 0.00 - 0.07 K/uL    Comment: Performed at [facility] [address], [place], [place] [address]  Comprehensive metabolic panel     Status: Abnormal   Collection Time: 02/15/23  5:45 PM  Result Value Ref Range   Sodium 142 135 - 145 mmol/L   Potassium 3.8 3.5 - 5.1 mmol/L   Chloride 108 98 - 111 mmol/L   CO2 19 (L) 22 - 32 mmol/L   Glucose, Bld 195 (H) 70 - 99 mg/dL    Comment: Glucose reference range applies only to samples taken after fasting for at least 8 hours.   BUN 59 (H) 6 - 20 mg/dL   Creatinine, Ser 4.79 (H) 0.61 - 1.24 mg/dL   Calcium 8.4 (L) 8.9 - 10.3 mg/dL   Total Protein 6.2 (L) 6.5 - 8.1 g/dL   Albumin 2.9 (L) 3.5 - 5.0 g/dL   AST 14 (L) 15 - 41 U/L   ALT 10 0 - 44 U/L   Alkaline Phosphatase 68 38 - 126 U/L   Total Bilirubin 0.4 0.3 - 1.2 mg/dL   GFR, Estimated 14 (L) >60 mL/min    Comment: (NOTE) Calculated using the CKD-EPI Creatinine Equation (2021)    Anion gap 15 5 - 15    Comment: Performed at [facility], [place] [address], [place], [place] [address]  Troponin I (High Sensitivity)     Status: None   Collection Time: 02/15/23  5:45 PM  Result Value Ref Range   Troponin I (High Sensitivity) 16 <18 ng/L    Comment: (NOTE) Elevated high sensitivity troponin I (hsTnI) values and significant  changes across serial measurements may suggest ACS but many other  chronic and acute conditions are known to elevate hsTnI results.  Refer to the "Links" section for chest pain algorithms and  additional  guidance. Performed at [facility], [place] [address], [place], [place] [address]   D-dimer, quantitative     Status: Abnormal   Collection Time: 02/15/23  5:45 PM  Result Value  Ref Range   D-Dimer, Quant 1.53 (H) 0.00 - 0.50 ug/mL-FEU    Comment: (NOTE) At the manufacturer cut-off value of 0.5 g/mL FEU, this assay has a negative predictive value of 95-100%.This assay is intended for use in conjunction with a clinical pretest probability (PTP) assessment model to exclude pulmonary embolism (PE) and deep venous thrombosis (DVT) in outpatients suspected of PE or DVT. Results should be correlated with clinical presentation. Performed at [facility], [place] [address] [place], [place] [address]   I-stat chem 8, ed     Status: Abnormal   Collection Time: 02/15/23  6:08 PM  Result Value Ref Range   Sodium 143 135 - 145 mmol/L   Potassium 3.8 3.5 - 5.1 mmol/L   Chloride 110 98 - 111 mmol/L   BUN 56 (H) 6 - 20 mg/dL   Creatinine, Ser 5.60 (H) 0.61 - 1.24 mg/dL   Glucose, Bld 192 (H) 70 - 99 mg/dL    Comment: Glucose reference range applies only to samples taken after fasting for at least 8 hours.   Calcium, Ion 1.08 (L) 1.15 - 1.40 mmol/L   TCO2 22 22 - 32 mmol/L   Hemoglobin 6.5 (LL) 13.0 - 17.0 g/dL   HCT 19.0 (L) 39.0 - 52.0 %   Comment NOTIFIED PHYSICIAN   Type and screen     Status: None (Preliminary result)   Collection Time: 02/15/23  7:15 PM  Result Value Ref Range   ABO/RH(D) A NEG    Antibody Screen NEG    Sample Expiration 02/18/2023,2359    Unit Number [id number]    Blood Component Type RED CELLS,LR    Unit division 00    Status of Unit ISSUED    Transfusion Status OK TO TRANSFUSE    Crossmatch Result      Compatible Performed at [facility], [place] [address] [place], [place] [address]    Unit Number [id number]    Blood Component Type RED CELLS,LR    Unit division 00    Status of Unit ALLOCATED    Transfusion Status OK TO  TRANSFUSE    Crossmatch Result Compatible   Troponin I (High Sensitivity)     Status: Abnormal   Collection Time: 02/15/23  7:30 PM  Result Value Ref Range   Troponin I (High Sensitivity) 18 (H) <18 ng/L    Comment: (NOTE) Elevated high sensitivity troponin I (hsTnI) values and significant  changes across serial measurements may suggest ACS but many other  chronic and acute conditions are known to elevate hsTnI results.  Refer to the "Links" section for chest pain algorithms and additional  guidance. Performed at [facility], [place] [address], [place], [place] [address]   Prepare RBC (crossmatch)     Status: None   Collection Time: 02/15/23  8:12 PM  Result Value Ref Range   Order Confirmation      ORDER PROCESSED BY BLOOD BANK Performed at [facility], [place] [address], [place],  [address]    DG Chest Port 1 View  Result Date: 02/15/2023 CLINICAL DATA:  Shortness of breath EXAM: PORTABLE CHEST 1 VIEW COMPARISON:  Chest x-ray 02/12/2023 FINDINGS: The heart size and mediastinal contours are within normal limits. Both lungs are clear. The visualized skeletal structures are unremarkable. IMPRESSION: No active disease. Electronically Signed   By: Ronney Asters M.D.   On: 02/15/2023 17:44    Pending Labs [employer] (From admission, onward)     Start  Ordered   02/16/23 0500  CBC  Tomorrow morning,   R        02/15/23 2114   02/16/23 [id number]  Basic metabolic panel  Tomorrow morning,   R        02/15/23 2114   02/15/23 2300  Hemoglobin and hematocrit, blood  Once,   R       Comments: Post-transfusion H&H    02/15/23 2141            Vitals/Pain Today's Vitals   02/15/23 1915 02/15/23 1932 02/15/23 2045 02/15/23 2118  BP: (!) 164/91 (!) 153/99 (!) 169/94 (!) 177/96  Pulse: (!) 110 (!) 103 99 99  Resp:  16 18 19   Temp:  98.7 F (37.1 C)  98.9 F (37.2 C)  TempSrc:  Oral  Oral  SpO2: 100% 100% 100% 100%  Weight:      Height:      PainSc:         Isolation Precautions No active isolations  Medications Medications  enoxaparin (LOVENOX) injection 30 mg (has no administration in time range)  acetaminophen (TYLENOL) tablet 650 mg (has no administration in time range)    Or  acetaminophen (TYLENOL) suppository 650 mg (has no administration in time range)  insulin aspart (novoLOG) injection 0-6 Units (has no administration in time range)  lactated ringers bolus 1,000 mL (0 mLs Intravenous Stopped 02/15/23 1933)  0.9 %  sodium chloride infusion (Manually program via Guardrails IV Fluids) ( Intravenous New Bag/Given 02/15/23 2120)    Mobility walks with device     Focused Assessments Cardiac Assessment Handoff:  Cardiac Rhythm: Sinus tachycardia No results found for: "CKTOTAL", "CKMB", "CKMBINDEX", "TROPONINI" Lab Results  Component Value Date   DDIMER 1.53 (H) 02/15/2023   Does the Patient currently have chest pain? No    R Recommendations: See Admitting Provider Note  Report given to:   Additional Notes: has blood running

## 2023-02-15 NOTE — Hospital Course (Addendum)
[name] [name] is a 52 y.o.male with a history of T2DM, CKDV, PAD with R BKA, chronic anemia, HTN and HLD who was admitted to the [facility] Medicine Teaching Service at [facility] for SOB and tachycardia. His hospital course is detailed below:  Tachycardia Hypertension Presented to the ED with episode of SOB and palpitations. Given tachycardia and dyspnea, patient requiring PE imaging but needs VQ scan given kidney function. Patient was started on IV heparin pending VQ scan. Repeat H&H resulted 8.4. VQ scan showed no evidence of PE, heparin was discontinued. Cardiology was consulted and recommended Zio patch upon discharge and transitioning from Amlodipine to Coreg. Patient continued to be hypertensive, likely in the setting of CKDV, and his home Hydralazine was titrated to 50mg TID.  At discharge his heart rate was stable on Coreg and diltiazem.  He was scheduled to follow-up with cardiology outpatient for monitor and with EP.  Anemia In the ED, Hgb was 6.8 and patient was symptomatic and received 2u pRBCs. Hgb improved to 8.4, GI was consulted and recommend EGD and colonoscopy as patient was due to outpatient screening any way. Colonoscopy determined to be low yield given challenging prep. Patient underwent EGD on 3/29 which showed normal stomach duodenum.  Ruled out GI etiology for anemia.  Most likely this is secondary to chronic disease.  GI to follow-up outpatient.  At discharge his hemoglobin was stable at 8.5.   Other chronic conditions were medically managed with home medications and formulary alternatives as necessary (HLD, neuropathy)  PCP Follow-up Recommendations:  Obtain Dexcom or other means of CBG monitoring F/u Nephro for AVG/AVF scheduling Outpatient colonoscopy Recheck CBC outpatient to evaluate hemoglobin

## 2023-02-15 NOTE — H&P (Cosign Needed Addendum)
Hospital Admission History and Physical Service Pager: [phone]  Patient name: [name] [name] Medical record number: [id number] Date of Birth: 08-15-1971 Age: 52 y.o. Gender: male  Primary Care Provider: [name], MD Consultants: None Code Status: Full Code   Preferred Emergency Contact:  Contact Information     Name Relation Home Work Mobile   [facility] Mother [phone]  [phone]        Chief Complaint: SOB  Assessment and Plan: [name] [name] is a 52 y.o. male presenting with an episode of SOB and tachycardia. Differential for this patient's presentation of this includes PE (recent surgery, limited mobility, Wells score 4.5) and anemia (Hgb 6.8, no sources of bleeding). Less likely differential includes COPD (smoking history, normal lung exam), CAD (troponin and EKG wnl), infection (afebrile, normal WBC) and anxiety (flat affect, denies worry/concern).  * Tachycardia HR 100s upon admission, baseline per patient. Endorses feeling of rapid HR during episode of SOB earlier in the day. Concern for PE given Wells score 4.5, D-dimer 1.53, will obtain VQ scan in lieu of CT PE given CKDV. Underlying anemia possibly contributing. Vitals stable and comfortable on RA. -Admitted to [facility] med tele, Dr. Nori Riis attending -F/u VQ scan -Cardiac monitoring -Vitals per routine  Stage 5 chronic kidney disease ([place]) Cr 4.79 upon admission, baseline Cr 5-6. Pre-dialysis, planning for AVG/AVF placement but not starting HD per patient. Follows with [facility] S/p 1L LR bolus in ED. -Avoid nephrotoxic agents -Hold home Farxiga -Consider Nephro consult  Normocytic normochromic anemia Hgb 6.8 upon admission, MCV 86.6, baseline Hgb ~8-9. Likely in the setting of CKDV. Receiving 1u pRBC in ED. -Post-transfusion H&H -Trend CBC -Transfusion threshold <7  Essential hypertension Hypertensive upon admission to 170s/90s.Compliant with home meds but reports continually  elevated BP at home. -Consider restarting home Amlodipine 5mg  daily and Hydralazine 50mg  BID pending med rec  Uncontrolled type 2 diabetes mellitus with hyperglycemia, with long-term current use of insulin (HCC) A1c 8.7 in 12/2022. Follows with [facility]. Has Omnipod at home, not using currently. No longer has Dexcom, has not been checking BGL at home. -vsSSI -CBG checks   Chronic conditions: -Pending med rec to restart home meds  FEN/GI: Carb modified VTE Prophylaxis: Lovenox  Disposition: Med tele  History of Present Illness:  [name] [name] is a 52 y.o. male presenting with an episode of SOB and feeling like his heart was racing this afternoon. States he stood up to answer to door and it started suddenly. Lasted for about 1 hour. Denies any nausea/vomiting/abdominal pain/chest pain. Denies cough, congestion or fevers. Reports eating and drinking per usual. Denies melena or blood per rectum. Notes diarrhea with cramping x 1 week with around 3 watery stools per day. Recently completed 3 week course of abx for L foot osteomyelitis.  Denis any symptoms currently. States his HR is usually in 90-100s. Endorses leg pain since he had debridement and partial amputation in 12/2022 at [facility] with walker/wheelchair at baseline  Follow with [facility], Dr. Royce Macadamia. Planning to get AVG/AVF placed but no plans to start HD. Has no temporary catheter at this time.  Follows with End[facility] doctor at [facility], has an [employer] at home. Insulin regimen is 8-9 units of short acting once a day (humalog), timed around meals. Usually takes it around dinner time. Not checking BGLs currently.  BP usually pretty high at home, report's taking his medications.  In the ED, Hgb was 6.8 and patient was symptomatic,  1 unit PRBC was ordered. Given tachycardia and dyspnea, patient requiring PE imaging but needs VQ scan given kidney function.   Review Of Systems: Per HPI above  Pertinent  Past Medical History: T2DM CKDV PAD, h/o BKA Chronic anemia HTN HLD Remainder reviewed in history tab.   Pertinent Past Surgical History: R BKA Polypectomy L foot surgical debridement and partial ray amputation (12/2022) Remainder reviewed in history tab.   Pertinent Social History: Tobacco use: Current - < 1/2 ppd, since 52 yo Alcohol use: None Other Substance use: None Lives with mom and sister  Pertinent Family History: Mother: T2DM, HTN, HLD Father: CABG, T2DM Remainder reviewed in history tab.   Important Outpatient Medications: Amlodipine 5 mg daily Abilify 20 mg daily (not taking) Calcitriol 0.5 mcg  Lipitor 40 mg daily (not taking) Baclofen 10 mg daily Diltiazem 300 mg 24hr daily Farxiga 10 mg daily Flonase (not taking) Lasix 40mg  daily Gabapentin 300 mg BID Hydralazine 50 mg BID Omnipod for insulin (not using) Protonix 40mg  BID Flomax 0.4mg  prn Humalog - 8-9 units w/ dinner Remainder reviewed in medication history.   Objective: BP (!) 175/94 (BP Location: Right Arm)   Pulse 95   Temp 98.7 F (37.1 C) (Oral)   Resp 18   Ht 6\' 5"  (1.956 m)   Wt 108 kg   SpO2 100%   BMI 28.22 kg/m  Exam: General: Sitting up in bed, alert, NAD Eyes: PERRLA, anicteric sclera ENTM: Moist mucus membranes. Neck: Supple, non-tender Cardiovascular: RRR without murmur Respiratory: CTAB. Normal WOB on RA Gastrointestinal: Soft, non-tender, non-distended MSK: R BKA, L foot wrapped in bandage and in post-op shoe. No surrounding edema or erythema. Dorsalis pedis palpable on L foot. Derm: Warm, dry scaly skin on leg. Neuro:Motor and sensation intact globally Psych: Cooperative, flat affect  Labs:  CBC BMET  Recent Labs  Lab 02/15/23 1745 02/15/23 1808  WBC 10.5  --   HGB 6.8* 6.5*  HCT 22.0* 19.0*  PLT 188  --    Recent Labs  Lab 02/15/23 1745 02/15/23 1808  NA 142 143  K 3.8 3.8  CL 108 110  CO2 19*  --   BUN 59* 56*  CREATININE 4.79* 5.60*  GLUCOSE  195* 192*  CALCIUM 8.4*  --     Pertinent additional labs: D-dimer: 1.53 Troponin: 16  EKG: Sinus tachycardia - 151   Imaging Studies Performed: DG Chest Port 1 View Result Date: 02/15/2023 IMPRESSION: No active disease.  DG Chest Portable 1 View Result Date: 02/12/2023 IMPRESSION: Bilateral lower lobe bronchitic changes.   Colletta [place], MD 02/15/2023, 10:08 PM PGY-1, [facility] Intern pager: [phone], text pages welcome Secure chat group [facility] Upper-Level Resident Addendum   I have independently interviewed and examined the patient. I have discussed the above with the original author and agree with their documentation. Please see also any attending notes.   Holley Bouche, MD PGY-2, [facility] 02/15/2023 10:36 PM  FPTS Service pager: [phone] (text pages welcome through [place])

## 2023-02-15 NOTE — Assessment & Plan Note (Addendum)
Cr 4.79 upon admission, baseline Cr 5-6. Pre-dialysis, planning for AVG/AVF placement but not starting HD per patient. Follows with [facility] S/p 1L LR bolus in ED. -Avoid nephrotoxic agents -Hold home Farxiga -Consider Nephro consult

## 2023-02-15 NOTE — Assessment & Plan Note (Addendum)
HR stable for last 24 hours.  Denies new onset CP or SOB.  VQ scan negative for PE, DC'd heparin. -Cardiac monitoring -Vitals per routine -Cardiology consulted, appreciate recommendations -Continuing diltiazem and Coreg  -Zio patch outpatient

## 2023-02-15 NOTE — Assessment & Plan Note (Addendum)
Continues to be hypertensive. Compliant with home meds but reports continually elevated BP at home. -Continue hydralazine 50 mg every 8 hours -Continue Coreg 6.25 mg BID (3/26) -Continue diltiazem 300 mg daily

## 2023-02-15 NOTE — Assessment & Plan Note (Addendum)
Hgb stable 8.5 s/p 2 units RBCs (3/25).  Will need to rule out acute GI bleed, most likely secondary to chronic disease. Iron studies drawn after transfusion, previous studies WNL.  -Trend CBC -Transfusion threshold <7 -GI consulted, appreciate recommendations -EGD planned for today

## 2023-02-15 NOTE — ED Provider Notes (Addendum)
[facility] Provider Note   CSN: [id number] Arrival date & time: 02/15/23  1651     History  Chief Complaint  Patient presents with   Tachycardia    [name] [name] is a 52 y.o. male.  52 year old male who presents with acute onset of tachycardia and shortness of breath when he was at home this evening.  Patient states he went to stand up and that is when his symptoms started.  Denies any severe chest pain or chest pressure.  Has not had any recent illnesses last 24 hours.  Does have a history of end-stage renal disease and is predialysis.  No recent fever or chills.  Recent history of same and given 1 L of fluid.  States his heart rate normally runs in the low 100s.       Home Medications Prior to Admission medications   Medication Sig Start Date End Date Taking? Authorizing Provider  Truddie Crumble ULTRA-THIN LANCETS MISC Check blood sugars before meals twice daily 11/19/17   Mack Hook, MD  amLODipine (NORVASC) 5 MG tablet Take 5 mg by mouth daily.    [provider]  ARIPiprazole (ABILIFY) 20 MG tablet Take 20 mg by mouth daily. 08/06/19   [provider]  atorvastatin (LIPITOR) 40 MG tablet Take 1 tablet (40 mg total) by mouth at bedtime. 12/14/22   [name], MD  baclofen (LIORESAL) 10 MG tablet 1/2 tab twice a day 12/19/22   [name], MD  calcitRIOL (ROCALTROL) 0.5 MCG capsule Take 0.5 mcg by mouth daily. Patient not taking: Reported on 02/10/2023 09/08/22   [provider]  diltiazem (CARDIZEM CD) 300 MG 24 hr capsule TAKE 1 CAPSULE(300 MG) BY MOUTH AT BEDTIME 10/21/22   Baldwin Jamaica, PA-C  FARXIGA 10 MG TABS tablet Take 1 tablet (10 mg total) by mouth daily. 11/06/22   [name], MD  fluticasone (FLONASE) 50 MCG/ACT nasal spray Place 2 sprays into both nostrils daily. 11/06/22   [name], MD  furosemide (LASIX) 40 MG tablet Take 40 mg by mouth daily.    [provider]  gabapentin (NEURONTIN) 300 MG capsule Take 300 mg by mouth 2 (two) times daily. Pt takes 300 mg 1 tablet in the morning, and 300 mg 1 tablet in the afternoon.    [provider]  glucose blood (AGAMATRIX PRESTO TEST) test strip Check blood sugars twice daily before meals 02/02/18   Mack Hook, MD  glucose blood test strip 1 each by Other route as needed for other. Use as instructed    [provider]  hydrALAZINE (APRESOLINE) 50 MG tablet Take 50 mg by mouth in the morning and at bedtime. 07/23/22   [provider]  Insulin Disposable Pump (OMNIPOD DASH PODS, GEN 4,) MISC SMARTSIG:SUB-Q Every 3 Days 04/15/22   [provider]  insulin lispro (HUMALOG) 100 UNIT/ML injection Use as directed via insulin pump. MAX TDD 30 units 04/10/22   [provider]  loratadine (CLARITIN) 10 MG tablet Take 1 tablet (10 mg total) by mouth every other day. 11/11/22   [name], MD  ondansetron (ZOFRAN) 4 MG tablet Take 1 tablet (4 mg total) by mouth 3 (three) times daily as needed for nausea or vomiting. 09/05/21   Sharyn Creamer, MD  pantoprazole (PROTONIX) 40 MG tablet Take 1 tablet (40 mg total) by mouth 2 (two) times daily. Patient taking differently: Take 40 mg by mouth daily. 04/23/21   Daleen Bo,  MD  tamsulosin (FLOMAX) 0.4 MG CAPS capsule Take 0.4 mg by mouth as needed (misc. genitourinary products). 08/05/22   [provider]      Allergies    Iron and Tape    Review of Systems   Review of Systems  All other systems reviewed and are negative.   Physical Exam Updated Vital Signs Temp 98.3 F (36.8 C) (Oral)   Ht 1.956 m (6\' 5" )   Wt 108 kg   BMI 28.22 kg/m  Physical Exam Vitals and nursing note reviewed.  Constitutional:      General: He is not in acute distress.    Appearance: Normal appearance. He is well-developed. He is not toxic-appearing.  HENT:     Head: Normocephalic and atraumatic.  Eyes:      General: Lids are normal.     Conjunctiva/sclera: Conjunctivae normal.     Pupils: Pupils are equal, round, and reactive to light.  Neck:     Thyroid: No thyroid mass.     Trachea: No tracheal deviation.  Cardiovascular:     Rate and Rhythm: Regular rhythm. Tachycardia present.     Heart sounds: Normal heart sounds. No murmur heard.    No gallop.  Pulmonary:     Effort: Pulmonary effort is normal. No respiratory distress.     Breath sounds: Normal breath sounds. No stridor. No decreased breath sounds, wheezing, rhonchi or rales.  Abdominal:     General: There is no distension.     Palpations: Abdomen is soft.     Tenderness: There is no abdominal tenderness. There is no rebound.  Musculoskeletal:        General: No tenderness. Normal range of motion.     Cervical back: Normal range of motion and neck supple.  Skin:    General: Skin is warm and dry.     Findings: No abrasion or rash.  Neurological:     Mental Status: He is alert and oriented to person, place, and time. Mental status is at baseline.     GCS: GCS eye subscore is 4. GCS verbal subscore is 5. GCS motor subscore is 6.     Cranial Nerves: No cranial nerve deficit.     Sensory: No sensory deficit.     Motor: Motor function is intact.  Psychiatric:        Attention and Perception: Attention normal.        Speech: Speech normal.        Behavior: Behavior normal.     ED Results / Procedures / Treatments   Labs (all labs ordered are listed, but only abnormal results are displayed) Labs Reviewed  CBC WITH DIFFERENTIAL/PLATELET  COMPREHENSIVE METABOLIC PANEL  I-STAT CHEM 8, ED  TROPONIN I (HIGH SENSITIVITY)    EKG EKG Interpretation  Date/Time:  Monday February 15 2023 16:58:22 EDT Ventricular Rate:  151 PR Interval:  117 QRS Duration: 88 QT Interval:  274 QTC Calculation: 435 R Axis:   67 Text Interpretation: Sinus or ectopic atrial tachycardia Borderline repolarization abnormality Confirmed by Lacretia Leigh  ([id number]) on 02/15/2023 5:01:00 PM  Radiology No results found.  Procedures Procedures    Medications Ordered in ED Medications  lactated ringers bolus 1,000 mL (has no administration in time range)  lactated ringers infusion (has no administration in time range)    ED Course/ Medical Decision Making/ A&P  Medical Decision Making Amount and/or Complexity of Data Reviewed Labs: ordered. Radiology: ordered.  Risk Prescription drug management.   Patient is EKG per interpretation shows sinus tachycardia.  Chest x-ray per my interpretation shows no active disease.  Patient treatment IV fluids here and heart rate did come down.  Patient's laboratory studies significant for worsening anemia likely due to his chronic kidney disease.  Patient's chronic kidney disease is around his baseline.  His potassium is normal.  No indications for emergent dialysis at this time.  Patient is D-dimer is elevated at 1.53.  Possibility of PE given that patient started when he stood up and became short of breath.  However due to his kidney function he cannot have a CT angio chest.  Will likely need to have a VQ scan.  Patient also with symptomatic anemia and packed red blood cell transfusion ordered.  Patient will require admission.  Will consult family practice teaching service  CRITICAL CARE Performed by: Leota Jacobsen Total critical care time: 60 minutes Critical care time was exclusive of separately billable procedures and treating other patients. Critical care was necessary to treat or prevent imminent or life-threatening deterioration. Critical care was time spent personally by me on the following activities: development of treatment plan with patient and/or surrogate as well as nursing, discussions with consultants, evaluation of patient's response to treatment, examination of patient, obtaining history from patient or surrogate, ordering and performing treatments and  interventions, ordering and review of laboratory studies, ordering and review of radiographic studies, pulse oximetry and re-evaluation of patient's condition.'        Final Clinical Impression(s) / ED Diagnoses Final diagnoses:  None    Rx / DC Orders ED Discharge Orders     None         Lacretia Leigh, MD 02/15/23 Despina Pole    Lacretia Leigh, MD 02/15/23 2012

## 2023-02-15 NOTE — ED Notes (Signed)
Lab called a critical hemoglobin 6.8 and Dr Zenia Resides was made aware

## 2023-02-16 ENCOUNTER — Ambulatory Visit: Payer: 59 | Admitting: Physician Assistant

## 2023-02-16 ENCOUNTER — Observation Stay (HOSPITAL_COMMUNITY): Payer: 59

## 2023-02-16 ENCOUNTER — Ambulatory Visit: Payer: 59 | Admitting: Student

## 2023-02-16 DIAGNOSIS — K2100 Gastro-esophageal reflux disease with esophagitis, without bleeding: Secondary | ICD-10-CM | POA: Diagnosis not present

## 2023-02-16 DIAGNOSIS — K449 Diaphragmatic hernia without obstruction or gangrene: Secondary | ICD-10-CM | POA: Diagnosis present

## 2023-02-16 DIAGNOSIS — E1129 Type 2 diabetes mellitus with other diabetic kidney complication: Secondary | ICD-10-CM | POA: Diagnosis not present

## 2023-02-16 DIAGNOSIS — R000 Tachycardia, unspecified: Secondary | ICD-10-CM

## 2023-02-16 DIAGNOSIS — E1151 Type 2 diabetes mellitus with diabetic peripheral angiopathy without gangrene: Secondary | ICD-10-CM | POA: Diagnosis present

## 2023-02-16 DIAGNOSIS — Z9109 Other allergy status, other than to drugs and biological substances: Secondary | ICD-10-CM | POA: Diagnosis not present

## 2023-02-16 DIAGNOSIS — I739 Peripheral vascular disease, unspecified: Secondary | ICD-10-CM

## 2023-02-16 DIAGNOSIS — I10 Essential (primary) hypertension: Secondary | ICD-10-CM | POA: Diagnosis not present

## 2023-02-16 DIAGNOSIS — D649 Anemia, unspecified: Secondary | ICD-10-CM

## 2023-02-16 DIAGNOSIS — F17210 Nicotine dependence, cigarettes, uncomplicated: Secondary | ICD-10-CM | POA: Diagnosis present

## 2023-02-16 DIAGNOSIS — I120 Hypertensive chronic kidney disease with stage 5 chronic kidney disease or end stage renal disease: Secondary | ICD-10-CM | POA: Diagnosis present

## 2023-02-16 DIAGNOSIS — Z560 Unemployment, unspecified: Secondary | ICD-10-CM | POA: Diagnosis not present

## 2023-02-16 DIAGNOSIS — E1165 Type 2 diabetes mellitus with hyperglycemia: Secondary | ICD-10-CM

## 2023-02-16 DIAGNOSIS — Z833 Family history of diabetes mellitus: Secondary | ICD-10-CM | POA: Diagnosis not present

## 2023-02-16 DIAGNOSIS — Z89511 Acquired absence of right leg below knee: Secondary | ICD-10-CM | POA: Diagnosis not present

## 2023-02-16 DIAGNOSIS — Z794 Long term (current) use of insulin: Secondary | ICD-10-CM | POA: Diagnosis not present

## 2023-02-16 DIAGNOSIS — D631 Anemia in chronic kidney disease: Secondary | ICD-10-CM | POA: Diagnosis present

## 2023-02-16 DIAGNOSIS — I2489 Other forms of acute ischemic heart disease: Secondary | ICD-10-CM | POA: Diagnosis not present

## 2023-02-16 DIAGNOSIS — E1122 Type 2 diabetes mellitus with diabetic chronic kidney disease: Secondary | ICD-10-CM | POA: Diagnosis present

## 2023-02-16 DIAGNOSIS — Z8719 Personal history of other diseases of the digestive system: Secondary | ICD-10-CM

## 2023-02-16 DIAGNOSIS — I4719 Other supraventricular tachycardia: Secondary | ICD-10-CM | POA: Diagnosis present

## 2023-02-16 DIAGNOSIS — E785 Hyperlipidemia, unspecified: Secondary | ICD-10-CM | POA: Diagnosis present

## 2023-02-16 DIAGNOSIS — I4892 Unspecified atrial flutter: Secondary | ICD-10-CM | POA: Diagnosis present

## 2023-02-16 DIAGNOSIS — N185 Chronic kidney disease, stage 5: Secondary | ICD-10-CM | POA: Diagnosis present

## 2023-02-16 DIAGNOSIS — K2289 Other specified disease of esophagus: Secondary | ICD-10-CM | POA: Diagnosis not present

## 2023-02-16 DIAGNOSIS — Z79899 Other long term (current) drug therapy: Secondary | ICD-10-CM | POA: Diagnosis not present

## 2023-02-16 DIAGNOSIS — Z7984 Long term (current) use of oral hypoglycemic drugs: Secondary | ICD-10-CM | POA: Diagnosis not present

## 2023-02-16 DIAGNOSIS — Z8249 Family history of ischemic heart disease and other diseases of the circulatory system: Secondary | ICD-10-CM | POA: Diagnosis not present

## 2023-02-16 DIAGNOSIS — R0602 Shortness of breath: Secondary | ICD-10-CM | POA: Diagnosis present

## 2023-02-16 DIAGNOSIS — Z888 Allergy status to other drugs, medicaments and biological substances status: Secondary | ICD-10-CM | POA: Diagnosis not present

## 2023-02-16 DIAGNOSIS — E1140 Type 2 diabetes mellitus with diabetic neuropathy, unspecified: Secondary | ICD-10-CM | POA: Diagnosis present

## 2023-02-16 DIAGNOSIS — J90 Pleural effusion, not elsewhere classified: Secondary | ICD-10-CM | POA: Diagnosis present

## 2023-02-16 DIAGNOSIS — Z9641 Presence of insulin pump (external) (internal): Secondary | ICD-10-CM | POA: Diagnosis present

## 2023-02-16 DIAGNOSIS — N2581 Secondary hyperparathyroidism of renal origin: Secondary | ICD-10-CM | POA: Diagnosis present

## 2023-02-16 HISTORY — DX: Pleural effusion, not elsewhere classified: J90

## 2023-02-16 LAB — CBC
HCT: 22.5 % — ABNORMAL LOW (ref 39.0–52.0)
Hemoglobin: 7 g/dL — ABNORMAL LOW (ref 13.0–17.0)
MCH: 26.4 pg (ref 26.0–34.0)
MCHC: 31.1 g/dL (ref 30.0–36.0)
MCV: 84.9 fL (ref 80.0–100.0)
Platelets: 192 10*3/uL (ref 150–400)
RBC: 2.65 MIL/uL — ABNORMAL LOW (ref 4.22–5.81)
RDW: 14.8 % (ref 11.5–15.5)
WBC: 8.4 10*3/uL (ref 4.0–10.5)
nRBC: 0 % (ref 0.0–0.2)

## 2023-02-16 LAB — GLUCOSE, CAPILLARY
Glucose-Capillary: 212 mg/dL — ABNORMAL HIGH (ref 70–99)
Glucose-Capillary: 129 mg/dL — ABNORMAL HIGH (ref 70–99)
Glucose-Capillary: 235 mg/dL — ABNORMAL HIGH (ref 70–99)
Glucose-Capillary: 172 mg/dL — ABNORMAL HIGH (ref 70–99)

## 2023-02-16 LAB — BASIC METABOLIC PANEL
Anion gap: 11 (ref 5–15)
BUN: 59 mg/dL — ABNORMAL HIGH (ref 6–20)
CO2: 20 mmol/L — ABNORMAL LOW (ref 22–32)
Calcium: 8.2 mg/dL — ABNORMAL LOW (ref 8.9–10.3)
Chloride: 107 mmol/L (ref 98–111)
Creatinine, Ser: 4.45 mg/dL — ABNORMAL HIGH (ref 0.61–1.24)
GFR, Estimated: 15 mL/min — ABNORMAL LOW (ref >60)
Glucose, Bld: 298 mg/dL — ABNORMAL HIGH (ref 70–99)
Potassium: 3.9 mmol/L (ref 3.5–5.1)
Sodium: 138 mmol/L (ref 135–145)

## 2023-02-16 LAB — HEMOGLOBIN AND HEMATOCRIT, BLOOD
HCT: 25.7 % — ABNORMAL LOW (ref 39.0–52.0)
HCT: 24.2 % — ABNORMAL LOW (ref 39.0–52.0)
Hemoglobin: 8.4 g/dL — ABNORMAL LOW (ref 13.0–17.0)
Hemoglobin: 8 g/dL — ABNORMAL LOW (ref 13.0–17.0)

## 2023-02-16 LAB — PREPARE RBC (CROSSMATCH)

## 2023-02-16 LAB — MRSA NEXT GEN BY PCR, NASAL: MRSA by PCR Next Gen: NOT DETECTED

## 2023-02-16 MED ORDER — TECHNETIUM TO 99M ALBUMIN AGGREGATED
4.0000 | Freq: Once | INTRAVENOUS | Status: AC | PRN
  Administered 2023-02-16: 4 via INTRAVENOUS

## 2023-02-16 MED ORDER — INSULIN ASPART 100 UNIT/ML IJ SOLN
3.0000 [IU] | Freq: Once | INTRAMUSCULAR | Status: AC
  Administered 2023-02-16: 3 [IU] via SUBCUTANEOUS

## 2023-02-16 MED ORDER — ENOXAPARIN SODIUM 30 MG/0.3ML IJ SOSY
30.0000 mg | PREFILLED_SYRINGE | INTRAMUSCULAR | Status: DC
  Administered 2023-02-16 – 2023-02-17 (×2): 30 mg via SUBCUTANEOUS
  Filled 2023-02-16 (×2): qty 0.3

## 2023-02-16 MED ORDER — FUROSEMIDE 40 MG PO TABS
40.0000 mg | ORAL_TABLET | Freq: Every day | ORAL | Status: DC
  Administered 2023-02-16 – 2023-02-18 (×3): 40 mg via ORAL
  Filled 2023-02-16 (×3): qty 1

## 2023-02-16 MED ORDER — HEPARIN BOLUS VIA INFUSION
3000.0000 [IU] | Freq: Once | INTRAVENOUS | Status: AC
  Administered 2023-02-16: 3000 [IU] via INTRAVENOUS
  Filled 2023-02-16: qty 3000

## 2023-02-16 MED ORDER — HYDRALAZINE HCL 50 MG PO TABS
50.0000 mg | ORAL_TABLET | Freq: Three times a day (TID) | ORAL | Status: DC
  Administered 2023-02-16 – 2023-02-18 (×7): 50 mg via ORAL
  Filled 2023-02-16 (×7): qty 1

## 2023-02-16 MED ORDER — GABAPENTIN 300 MG PO CAPS: 300.0000 mg | ORAL_CAPSULE | Freq: Every day | ORAL | Status: DC

## 2023-02-16 MED ORDER — AMLODIPINE BESYLATE 5 MG PO TABS
5.0000 mg | ORAL_TABLET | Freq: Every day | ORAL | Status: DC
  Administered 2023-02-16: 5 mg via ORAL
  Filled 2023-02-16: qty 1

## 2023-02-16 MED ORDER — GABAPENTIN 300 MG PO CAPS
300.0000 mg | ORAL_CAPSULE | Freq: Every day | ORAL | Status: DC
  Administered 2023-02-16 – 2023-02-17 (×2): 300 mg via ORAL
  Filled 2023-02-16 (×2): qty 1

## 2023-02-16 MED ORDER — CARVEDILOL 6.25 MG PO TABS
6.2500 mg | ORAL_TABLET | Freq: Two times a day (BID) | ORAL | Status: DC
  Administered 2023-02-16 – 2023-02-18 (×4): 6.25 mg via ORAL
  Filled 2023-02-16 (×4): qty 1

## 2023-02-16 MED ORDER — HEPARIN (PORCINE) 25000 UT/250ML-% IV SOLN
1600.0000 [IU]/h | INTRAVENOUS | Status: DC
  Administered 2023-02-16: 1600 [IU]/h via INTRAVENOUS
  Filled 2023-02-16: qty 250

## 2023-02-16 NOTE — Evaluation (Signed)
Occupational Therapy Evaluation and Discharge Summary Patient Details Name: [name] [name] MRN: [id number] DOB: 1971/01/28 Today's Date: 02/16/2023   History of Present Illness Pt is a 52 y.o. male admitted 3/35 with SOB and tachycardia. In ED, Hgb found to be 6.8. Pt received 1 unit PRBCs. Pt recently returned home from [facility] following L toe amputations. PMH: T2DM, CKDV, PAD h/o R BKA, chronic anemia, HTN, HLD. Normal PE scan results with no small or large defects. No sign of pleural disease.    Clinical Impression   Patient admitted for above diagnosis. PTA, patient reports living with mother and sister, being Mod I for functional mobility with use of prosthesis and RW. Patient also reports being Mod I in bADLs/iADLs and reporting 4 to 5 falls within the last 6 months d/t legs getting weak. Patient currently ambulating in the halls with Supervision, mild balance deficits observed. Patient completing lower body dressing with Min A to Supervision/setup assist. Pt would benefit from continued skilled acute OT services to address above deficits and help transition to next level of care. Pt would benefit from continue post acute Home OT services to maximize functional independence in the home environment.     Recommendations for follow up therapy are one component of a multi-disciplinary discharge planning process, led by the attending physician.  Recommendations may be updated based on patient status, additional functional criteria and insurance authorization.   Assistance Recommended at Discharge Intermittent Supervision/Assistance  Patient can return home with the following A little help with bathing/dressing/bathroom;A little help with walking and/or transfers;Assist for transportation;Help with stairs or ramp for entrance    Functional Status Assessment  Patient has had a recent decline in their functional status and demonstrates the ability to make significant improvements in  function in a reasonable and predictable amount of time.  Equipment Recommendations  None recommended by OT    Recommendations for Other Services       Precautions / Restrictions Precautions Precautions: Fall;Other (comment) Precaution Comments: watch HR Required Braces or Orthoses: Other Brace Other Brace: RLE prosthesis, L post-op shoe Restrictions Weight Bearing Restrictions: Yes LLE Weight Bearing: Weight bearing as tolerated      Mobility Bed Mobility Overal bed mobility: Modified Independent                  Transfers Overall transfer level: Needs assistance Equipment used: Rolling walker (2 wheels) Transfers: Sit to/from Stand Sit to Stand: Min guard, From elevated surface           General transfer comment: Uses Bed elevated to assist with sit>stands.      Balance Overall balance assessment: Needs assistance Sitting-balance support: No upper extremity supported, Feet supported Sitting balance-Leahy Scale: Good     Standing balance support: Bilateral upper extremity supported, During functional activity, Reliant on assistive device for balance Standing balance-Leahy Scale: Poor                             ADL either performed or assessed with clinical judgement   ADL Overall ADL's : Needs assistance/impaired Eating/Feeding: Independent   Grooming: Modified independent;Standing   Upper Body Bathing: Modified independent;Sitting Upper Body Bathing Details (indicate cue type and reason): uses TTB at baseline Lower Body Bathing: Modified independent;Sitting/lateral leans Lower Body Bathing Details (indicate cue type and reason): uses TTB at baseline Upper Body Dressing : Supervision/safety;Set up;Sitting   Lower Body Dressing: Sitting/lateral leans;Minimal assistance Lower Body Dressing Details (indicate  cue type and reason): Pt doffed/donned prothesis sitting EOB supervision/setup, Min A to doff/don L post op shoe Toilet Transfer:  Min guard;Ambulation;Rolling walker (2 wheels)   Toileting- Clothing Manipulation and Hygiene: Min guard;Sit to/from stand   Tub/ Shower Transfer: Agricultural engineer (2 wheels)   Functional mobility during ADLs: Supervision/safety;Rolling walker (2 wheels) General ADL Comments: Min guard assist provided during functional ambulation for brief periods d/t mild gait deficits. Unsure if it is baseline for patient, he reports no difficulties/changes but he also responds to prompts in a simple and uninterested fashion.     Vision         Perception     Praxis      Pertinent Vitals/Pain Pain Assessment Pain Score: 8  Pain Location: L foot Pain Descriptors / Indicators: Discomfort Pain Intervention(s): Limited activity within patient's tolerance, Monitored during session     Hand Dominance Right   Extremity/Trunk Assessment Upper Extremity Assessment Upper Extremity Assessment: Overall WFL for tasks assessed   Lower Extremity Assessment RLE Deficits / Details: h/o BKA LLE Deficits / Details: recent h/o toe amp, still in post-op shoe. WBAT   Cervical / Trunk Assessment Cervical / Trunk Assessment: Normal   Communication Communication Communication: No difficulties   Cognition Arousal/Alertness: Awake/alert Behavior During Therapy: Flat affect Overall Cognitive Status: Within Functional Limits for tasks assessed                                 General Comments: Pt minimally conversive during OT session, responds to prompts with simple statements     General Comments  HR at about 106 bpm with functional ambulation, Sp02 around 95%. Pt reported dizziness when seated EOB, BP taken 160/91    Exercises     Shoulder Instructions      Home Living Family/patient expects to be discharged to:: Private residence Living Arrangements: Parent;Other relatives (Lives with mom and sister) Available Help at Discharge: Family;Available 24 hours/day Type of Home:  House Home Access: Stairs to enter [employer] of Steps: 3 Entrance Stairs-Rails: Left;Right;Can reach both Home Layout: One level     Bathroom Shower/Tub: Teacher, early years/pre: Handicapped height Bathroom Accessibility: No   Home Equipment: Wheelchair - Publishing copy (2 wheels);Tub bench;Grab bars - tub/shower;Hand held shower head;Cane - quad          Prior Functioning/Environment Prior Level of Function : Independent/Modified Independent;Driving             Mobility Comments: Mod I independent in functional mobliity using RW. Has HHOT/HHPT at time that has just started. ADLs Comments: Mod I with bADLs/iADls        OT Problem List: Cardiopulmonary status limiting activity;Impaired balance (sitting and/or standing)      OT Treatment/Interventions: Self-care/ADL training;Therapeutic exercise;Therapeutic activities;DME and/or AE instruction;Patient/family education    OT Goals(Current goals can be found in the care plan section) Acute Rehab OT Goals Patient Stated Goal: Return home OT Goal Formulation: With patient Time For Goal Achievement: 03/02/23 Potential to Achieve Goals: Good  OT Frequency: Min 2X/week    Co-evaluation              AM-PAC OT "6 Clicks" Daily Activity     Outcome Measure Help from another person eating meals?: None Help from another person taking care of personal grooming?: A Little Help from another person toileting, which includes using toliet, bedpan, or urinal?: A Little Help from another person  bathing (including washing, rinsing, drying)?: A Little Help from another person to put on and taking off regular upper body clothing?: A Little Help from another person to put on and taking off regular lower body clothing?: A Little 6 Click Score: 19   End of Session Equipment Utilized During Treatment: Gait belt;Rolling walker (2 wheels) Nurse Communication: Mobility status  Activity Tolerance: Patient  tolerated treatment well Patient left: in bed;with call bell/phone within reach  OT Visit Diagnosis: Unsteadiness on feet (R26.81);Other abnormalities of gait and mobility (R26.89)                Time: [id number] OT Time Calculation (min): 33 min Charges:  OT General Charges $OT Visit: 1 Visit OT Evaluation $OT Eval Moderate Complexity: 1 Mod OT Treatments $Therapeutic Activity: 8-22 mins  02/16/2023  AB, OTR/L  Acute Rehabilitation Services  Office: [phone]   Cori Razor 02/16/2023, 3:34 PM

## 2023-02-16 NOTE — Discharge Instructions (Addendum)
Dear [name] [name],  Thank you for letting us participate in your care. You were hospitalized for Tachycardia. You did not have evidence of a blood clot in your lung. You did not have evidence of a GI bleed.   Please follow up with your outpatient appointments.    DOCTOR'S APPOINTMENT   Future Appointments  Date Time Provider [place]  02/22/2023  1:50 PM [name], MD [facility]    Follow-up Information     [name], MD. Go on 02/22/2023.   Specialty: Family Medicine Why: At 1:50 pm. This is your hospital follow up with your primary care doctor. Please arrive 15 minutes early. Contact information: [place] [address] [phone]         [facility]. Call.   Specialty: Nephrology Why: Call to make a hospital follow up with your nephrologist at [facility]. Contact information: [place] [address] [phone]                Take care and be well!  [facility]  [place], [place] [address] [phone]

## 2023-02-16 NOTE — Evaluation (Signed)
Physical Therapy Evaluation Patient Details Name: [name] [name] MRN: [id number] DOB: 1971/03/01 Today's Date: 02/16/2023  History of Present Illness  Pt is a 52 y.o. male admitted 3/35 with SOB and tachycardia. In ED, Hgb found to be 6.8. Pt received 1 unit PRBCs. Pt recently returned home from [facility] following L toe amputations. PMH: T2DM, CKDV, PAD h/o R BKA, chronic anemia, HTN, HLD   Clinical Impression  Pt admitted with above diagnosis. He recently underwent L toe amp, followed by 3 weeks at [facility]. He had just d/c'd home a few days prior to current admission. He reports working with [place] when current problem occurred resulting in transport to ED. He lives at home with family, mod I short household distances with RW and R prosthesis. Pt currently with functional limitations due to the deficits listed below (see PT Problem List). On eval, he required min guard assist transfers and amb 35' with RW. VSS during mobility. No c/o dizziness or SOB. Pt will benefit from acute skilled PT to increase their independence and safety with mobility to allow discharge.          Recommendations for follow up therapy are one component of a multi-disciplinary discharge planning process, led by the attending physician.  Recommendations may be updated based on patient status, additional functional criteria and insurance authorization.  Follow Up Recommendations       Assistance Recommended at Discharge PRN  Patient can return home with the following  A little help with bathing/dressing/bathroom;Assistance with cooking/housework;Assist for transportation;Help with stairs or ramp for entrance    Equipment Recommendations None recommended by PT  Recommendations for Other Services       Functional Status Assessment Patient has had a recent decline in their functional status and demonstrates the ability to make significant improvements in function in a reasonable and predictable amount  of time.     Precautions / Restrictions Precautions Precautions: Fall;Other (comment) Precaution Comments: watch HR Required Braces or Orthoses: Other Brace Other Brace: RLE prosthesis, L post-op shoe Restrictions LLE Weight Bearing: Weight bearing as tolerated      Mobility  Bed Mobility Overal bed mobility: Modified Independent                  Transfers Overall transfer level: Needs assistance Equipment used: Rolling walker (2 wheels) Transfers: Sit to/from Stand Sit to Stand: Min guard, From elevated surface           General transfer comment: multi attempts to power up, elevated surface required as pt is 6'5"    Ambulation/Gait Ambulation/Gait assistance: Min guard Gait Distance (Feet): 35 Feet Assistive device: Rolling walker (2 wheels) Gait Pattern/deviations: Step-through pattern, Decreased stride length Gait velocity: decreased Gait velocity interpretation: 1.31 - 2.62 ft/sec, indicative of limited community ambulator   General Gait Details: steady gait with RW. BP stable. HR stable. Pt without c/o dizziness or SOB.  Amb limited to in room due to pt preparing to leave for nuclear medicine.  Stairs            Wheelchair Mobility    Modified Rankin (Stroke Patients Only)       Balance Overall balance assessment: Needs assistance Sitting-balance support: No upper extremity supported, Feet supported Sitting balance-Leahy Scale: Good     Standing balance support: Bilateral upper extremity supported, During functional activity, Reliant on assistive device for balance Standing balance-Leahy Scale: Poor  Pertinent Vitals/Pain Pain Assessment Pain Assessment: 0-10 Pain Score: 7  Pain Location: L foot Pain Descriptors / Indicators: Grimacing, Discomfort, Sore Pain Intervention(s): Limited activity within patient's tolerance, Repositioned    Home Living Family/patient expects to be discharged to::  Private residence Living Arrangements: Spouse/significant other Available Help at Discharge: Family;Available 24 hours/day Type of Home: House Home Access: Stairs to enter Entrance Stairs-Rails: Left;Right;Can reach both Entrance Stairs-Number of Steps: 3   Home Layout: One level Home Equipment: Wheelchair - Publishing copy (2 wheels)      Prior Function Prior Level of Function : Independent/Modified Independent             Mobility Comments: amb short household distances with RW. Active with HHPT at time of admission. Mod I at w/c level       Hand Dominance   Dominant Hand: Right    Extremity/Trunk Assessment   Upper Extremity Assessment Upper Extremity Assessment: Defer to OT evaluation    Lower Extremity Assessment Lower Extremity Assessment: RLE deficits/detail;LLE deficits/detail RLE Deficits / Details: h/o BKA LLE Deficits / Details: recent h/o toe amp, still in post-op shoe    Cervical / Trunk Assessment Cervical / Trunk Assessment: Normal  Communication   Communication: No difficulties  Cognition Arousal/Alertness: Awake/alert Behavior During Therapy: Flat affect Overall Cognitive Status: Within Functional Limits for tasks assessed                                          General Comments      Exercises     Assessment/Plan    PT Assessment Patient needs continued PT services  PT Problem List Decreased strength;Decreased balance;Pain;Decreased mobility;Cardiopulmonary status limiting activity;Decreased activity tolerance       PT Treatment Interventions Functional mobility training;Balance training;Patient/family education;Gait training;Therapeutic activities;Therapeutic exercise;Stair training    PT Goals (Current goals can be found in the Care Plan section)  Acute Rehab PT Goals Patient Stated Goal: home PT Goal Formulation: With patient Time For Goal Achievement: 03/02/23 Potential to Achieve Goals: Good     Frequency Min 1X/week     Co-evaluation               AM-PAC PT "6 Clicks" Mobility  Outcome Measure Help needed turning from your back to your side while in a flat bed without using bedrails?: None Help needed moving from lying on your back to sitting on the side of a flat bed without using bedrails?: None Help needed moving to and from a bed to a chair (including a wheelchair)?: A Little Help needed standing up from a chair using your arms (e.g., wheelchair or bedside chair)?: A Little Help needed to walk in hospital room?: A Little Help needed climbing 3-5 steps with a railing? : A Little 6 Click Score: 20    End of Session Equipment Utilized During Treatment: Gait belt Activity Tolerance: Patient tolerated treatment well Patient left: in bed;with call bell/phone within reach Nurse Communication: Mobility status PT Visit Diagnosis: Difficulty in walking, not elsewhere classified (R26.2);Muscle weakness (generalized) (M62.81)    Time: 0935-1000 PT Time Calculation (min) (ACUTE ONLY): 25 min   Charges:   PT Evaluation $PT Eval Moderate Complexity: 1 Mod PT Treatments $Gait Training: 8-22 mins        Gloriann Loan., PT  Office # [phone]   Lorriane Shire 02/16/2023, 10:41 AM

## 2023-02-16 NOTE — Progress Notes (Signed)
ANTICOAGULATION CONSULT NOTE - Initial Consult  Pharmacy Consult for Heparin Indication: pulmonary embolus  Allergies  Allergen Reactions   Iron Nausea And Vomiting    With the iron tablets   Tape Rash    Rash at site of tape--okay to use paper tape    Patient Measurements: Height: 6\' 5"  (195.6 cm) Weight: 100.8 kg (222 lb 3.6 oz) IBW/kg (Calculated) : 89.1  Vital Signs: Temp: 98.7 F (37.1 C) (03/26 0436) Temp Source: Oral (03/26 0436) BP: 176/92 (03/26 0500) Pulse Rate: 91 (03/26 0222)  Labs: Recent Labs    02/15/23 1745 02/15/23 1808 02/15/23 1930 02/16/23 0234  HGB 6.8* 6.5*  --  7.0*  HCT 22.0* 19.0*  --  22.5*  PLT 188  --   --  192  CREATININE 4.79* 5.60*  --  4.45*  TROPONINIHS 16  --  18*  --     Estimated Creatinine Clearance: 25 mL/min (A) (by C-G formula based on SCr of 4.45 mg/dL (H)).   Medical History: Past Medical History:  Diagnosis Date   Allergy    seasonal and environmental   Anemia    ARF (acute renal failure) ([place]) 09/2017   Chronic constipation 05/13/2020   CKD (chronic kidney disease) stage 4, GFR 15-29 ml/min (HCC)    Depression    Diabetes mellitus without complication ([place]) [id number]   GERD (gastroesophageal reflux disease)    Hyperlipidemia    Hypertension    Necrotizing fasciitis ([place]) 10/13/2017   Neuromuscular disorder (HCC)    neuropathy   PAD (peripheral artery disease) (HCC)    Secondary hyperparathyroidism (HCC)    Wound dehiscence 11/24/2017    Medications:  Scheduled:   amLODipine  5 mg Oral Daily   atorvastatin  40 mg Oral QHS   diltiazem  300 mg Oral Daily   enoxaparin (LOVENOX) injection  30 mg Subcutaneous Q24H   gabapentin  300 mg Oral BID   hydrALAZINE  50 mg Oral BID   insulin aspart  0-6 Units Subcutaneous TID WC    Assessment: 52 y.o. male with possible PE awaiting VQ scan for heparin.   Goal of Therapy:  Heparin level 0.3-0.7 units/ml Monitor platelets by anticoagulation protocol: Yes   Plan:   D/C Lovenox Heparin 3000 units IV bolus, then start heparin 1600 units/hr Check heparin level in 8 hours.   Caryl Pina 02/16/2023,6:35 AM

## 2023-02-16 NOTE — Consult Note (Signed)
Consultation  Referring Provider: [facility]/ Nori Riis Primary Care Physician:  [name], MD Primary Gastroenterologist:  Dr.Danis  Reason for Consultation:   severe anemia  HPI: [name] [name] is a 52 y.o. 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 He has history of chronic anemia. He had presented to the emergency room last night with profound weakness, then onset of tachycardia and shortness of breath last evening.  No chest pain. He was noted to be tachycardic in the 110-120 range on admission. D-dimer 1.53 VQ scan was done today and negative  He has also been severely hypertensive overnight and blood pressure currently 186/110.  Reviewing his labs he has had anemia dating back over the least the past couple of years.  In October 2022 hemoglobin was 11.3, August 2023 hemoglobin 9.1, February 2024 hemoglobin 8.1, 02/14/2023 hemoglobin 7.3 and on admission last evening hemoglobin 6.8. Most recent iron studies in January 2023 ferritin 36/iron 62/TIBC 288/iron sat 21  He has been unaware of any bleeding.  Denies any melena.  He denies any abdominal pain but says he has been having diarrhea over the past week, with 3-4 episodes per day.  He has not been on any recent antibiotics, no new medications. He has not been on a chronic PPI, denies any dysphagia does get occasional heartburn and burping.  He had undergone colonoscopy in 2020 per Dr. Loletha Carrow with a fair to poor prep did have 3 polyps removed all 6 to 8 mm in size and all tubular adenomas.  Recommendation was for follow-up in 1 year. He had 2 endoscopies in 2022, the last was done in October 2022 that did show grade D esophagitis, gastritis and some mild nodularity in the duodenal bulb.   He was  transfused 3 units of packed RBCs last evening-and hemoglobin this morning 8.4/hematocrit 25.7   Past Medical History:  Diagnosis Date   Allergy    seasonal and environmental   Anemia    ARF (acute renal failure) ([facility]) 09/2017   Chronic constipation 05/13/2020   CKD (chronic kidney disease) stage 4, GFR 15-29 ml/min (HCC)    Depression    Diabetes mellitus without complication ([place]) [id number]   GERD (gastroesophageal reflux disease)    Hyperlipidemia    Hypertension    Necrotizing fasciitis ([facility]) 10/13/2017   Neuromuscular disorder (HCC)    neuropathy   PAD (peripheral artery disease) ([place])    Secondary hyperparathyroidism ([place])    Wound dehiscence 11/24/2017    Past Surgical History:  Procedure Laterality Date   AMPUTATION Right 10/13/2017   Procedure: RIGHT BELOW KNEE AMPUTATION;  Surgeon: [name], MD;  Location: [facility];  Service: Orthopedics;  Laterality: Right;   AMPUTATION Right 11/24/2017   Procedure: AMPUTATION BELOW KNEE REVISION;  Surgeon: [name], MD;  Location: [facility];  Service: Orthopedics;  Laterality: Right;   BIOPSY  09/02/2021   Procedure: BIOPSY;  Surgeon: Sharyn Creamer, MD;  Location: [facility] ENDOSCOPY;  Service: Gastroenterology;;   COLONOSCOPY  05/11/2019   ESOPHAGOGASTRODUODENOSCOPY (EGD) WITH PROPOFOL N/A 09/02/2021   Procedure: ESOPHAGOGASTRODUODENOSCOPY (EGD) WITH PROPOFOL;  Surgeon: Sharyn Creamer, MD;  Location: [facility];  Service: Gastroenterology;  Laterality: N/A;   NO PAST SURGERIES     POLYPECTOMY     WISDOM TOOTH  EXTRACTION      Prior to Admission medications   Medication Sig Start Date End Date Taking? Authorizing Provider  baclofen (LIORESAL) 10 MG tablet 1/2 tab twice a day Patient taking differently: Take 10 mg by mouth daily. 1/2 tab twice a day 12/19/22  Yes [name], MD  calcitRIOL (ROCALTROL) 0.5 MCG capsule Take 0.5 mcg by mouth in the morning and at bedtime. 09/08/22  Yes [provider]  diltiazem (CARDIZEM  CD) 300 MG 24 hr capsule TAKE 1 CAPSULE(300 MG) BY MOUTH AT BEDTIME 10/21/22  Yes Ursuy, Renee Lynn, PA-C  FARXIGA 10 MG TABS tablet Take 1 tablet (10 mg total) by mouth daily. 11/06/22  Yes [name], MD  furosemide (LASIX) 40 MG tablet Take 40 mg by mouth daily.   Yes [provider]  gabapentin (NEURONTIN) 300 MG capsule Take 300 mg by mouth daily.   Yes [provider]  HUMALOG KWIKPEN 100 UNIT/ML KwikPen Inject 2-8 Units into the skin 3 (three) times daily as needed (hyperglycemia). Dose per sliding scale 01/13/23  Yes [provider]  hydrALAZINE (APRESOLINE) 50 MG tablet Take 50 mg by mouth in the morning and at bedtime. 07/23/22  Yes [provider]  ondansetron (ZOFRAN) 4 MG tablet Take 1 tablet (4 mg total) by mouth 3 (three) times daily as needed for nausea or vomiting. 09/05/21  Yes Sharyn Creamer, MD  tamsulosin (FLOMAX) 0.4 MG CAPS capsule Take 0.4 mg by mouth at bedtime. 08/05/22  Yes [provider]  Leandra Kern LANCETS MISC Check blood sugars before meals twice daily 11/19/17   Mack Hook, MD  amLODipine (NORVASC) 10 MG tablet Take 10 mg by mouth daily. 02/05/23   [provider]  amLODipine (NORVASC) 5 MG tablet Take 5 mg by mouth daily.    [provider]  atorvastatin (LIPITOR) 40 MG tablet Take 1 tablet (40 mg total) by mouth at bedtime. 12/14/22   [name], MD  glucose blood (AGAMATRIX PRESTO TEST) test strip Check blood sugars twice daily before meals 02/02/18   Mack Hook, MD  glucose blood test strip 1 each by Other route as needed for other. Use as instructed    [provider]  Insulin Disposable Pump (OMNIPOD DASH PODS, GEN 4,) MISC SMARTSIG:SUB-Q Every 3 Days Patient not taking: Reported on 02/16/2023 04/15/22   [provider]  pantoprazole (PROTONIX) 40 MG tablet Take 40 mg by mouth daily.    [provider]    Current Facility-Administered  Medications  Medication Dose Route Frequency Provider Last Rate Last Admin   acetaminophen (TYLENOL) tablet 650 mg  650 mg Oral Q6H PRN Colletta Maryland, MD   650 mg at 02/16/23 [id number]   Or   acetaminophen (TYLENOL) suppository 650 mg  650 mg Rectal Q6H PRN Colletta Maryland, MD       amLODipine (NORVASC) tablet 5 mg  5 mg Oral Daily Colletta Maryland, MD   5 mg at 02/16/23 [id number]   atorvastatin (LIPITOR) tablet 40 mg  40 mg Oral QHS Colletta Maryland, MD   40 mg at 02/15/23 2355   diltiazem (CARDIZEM CD) 24 hr capsule 300 mg  300 mg Oral Daily Colletta Maryland, MD   300 mg at 02/16/23 [id number]   furosemide (LASIX) tablet 40 mg  40 mg Oral Daily Rolanda Lundborg, MD       gabapentin (NEURONTIN) capsule 300 mg  300 mg Oral QHS Darci Current, DO       hydrALAZINE (APRESOLINE) tablet 50  mg  50 mg Oral Q8H Rolanda Lundborg, MD       insulin aspart (novoLOG) injection 0-6 Units  0-6 Units Subcutaneous TID WC Colletta Maryland, MD   2 Units at 02/16/23 0751   tamsulosin (FLOMAX) capsule 0.4 mg  0.4 mg Oral Daily PRN Colletta Maryland, MD        Allergies as of 02/15/2023 - Review Complete 02/15/2023  Allergen Reaction Noted   Iron Nausea And Vomiting 12/12/2021   Tape Rash 11/19/2017    Family History  Problem Relation Age of Onset   Diabetes Mellitus II Mother    Depression Mother    Colon polyps Mother    Hypertension Mother    High Cholesterol Mother    Heart disease Father        CABG x 4.  04/2017   Diabetes Father    High Cholesterol Father    Hypertension Father    Heart disease Maternal Grandmother    Heart disease Maternal Grandfather    Colon cancer Neg Hx    Esophageal cancer Neg Hx    Liver cancer Neg Hx    Pancreatic cancer Neg Hx    Rectal cancer Neg Hx    Stomach cancer Neg Hx     Social History   Socioeconomic History   Marital status: Divorced    Spouse name: Not on file   Number of children: 1   Years of education: 12   Highest education level: Associate degree:  occupational, Hotel manager, or vocational program  Occupational History   Occupation: unemployed  Tobacco Use   Smoking status: Every Day    Packs/day: 0.50    Years: 35.00    Additional pack years: 0.00    Total pack years: 17.50    Types: Cigarettes   Smokeless tobacco: Never  Vaping Use   Vaping Use: Never used  Substance and Sexual Activity   Alcohol use: No   Drug use: No   Sexual activity: Not on file  Other Topics Concern   Not on file  Social History Narrative   Originally from  [place].   Is living with his mother, who essentially supports him now.   Social Determinants of Health   Financial Resource Strain: Medium Risk (11/19/2017)   Overall Financial Resource Strain (CARDIA)    Difficulty of Paying Living Expenses: Somewhat hard  Food Insecurity: Not on file  Transportation Needs: Not on file  Physical Activity: Not on file  Stress: Not on file  Social Connections: Not on file  Intimate Partner Violence: Not on file    Review of Systems: Pertinent positive and negative review of systems were noted in the above HPI section.  All other review of systems was otherwise negative.   Physical Exam: Vital signs in last 24 hours: Temp:  [98.2 F (36.8 C)-99.1 F (37.3 C)] 98.7 F (37.1 C) (03/26 1140) Pulse Rate:  [86-110] 86 (03/26 1140) Resp:  [11-19] 14 (03/26 1140) BP: (153-193)/(81-114) 183/99 (03/26 1140) SpO2:  [97 %-100 %] 97 % (03/26 1140) Weight:  [100.8 kg-108 kg] 100.8 kg (03/25 2321)   General:   Alert,  Well-developed, well-nourished, pale somewhat chronically ill-appearing white male pleasant and cooperative in NAD Head:  Normocephalic and atraumatic. Eyes:  Sclera clear, no icterus.   Conjunctiva pink Ears:  Normal auditory acuity. Nose:  No deformity, discharge,  or lesions. Mouth:  No deformity or lesions.   Neck:  Supple; no masses or thyromegaly. Lungs:  Clear throughout to auscultation.  No wheezes, crackles, or rhonchi.  Heart:   Regular rate and rhythm; no murmurs, clicks, rubs,  or gallops. Abdomen:  Soft,nontender, BS active,nonpalp mass or hsm.   Rectal: not done Msk:  Symmetrical without gross deformities. . Pulses:  Normal pulses noted. Extremities: Status post right BKA, status post very recent partial amputation left foot Neurologic:  Alert and  oriented x4;  grossly normal neurologically. Skin:  Intact without significant lesions or rashes.. Psych:  Alert and cooperative. Normal mood and affect flat .  Intake/Output from previous day: 03/25 0701 - 03/26 0700 In: 1105 [P.O.:120; I.V.:40; Blood:945] Out: [id number] Intake/Output this shift: Total I/O In: 574.9 [P.O.:480; I.V.:94.9] Out: 1000 [Urine:1000]  Lab Results: Recent Labs    02/15/23 1745 02/15/23 1808 02/16/23 0234 02/16/23 0856  WBC 10.5  --  8.4  --   HGB 6.8* 6.5* 7.0* 8.4*  HCT 22.0* 19.0* 22.5* 25.7*  PLT 188  --  192  --    BMET Recent Labs    02/15/23 1745 02/15/23 1808 02/16/23 0234  NA 142 143 138  K 3.8 3.8 3.9  CL 108 110 107  CO2 19*  --  20*  GLUCOSE 195* 192* 298*  BUN 59* 56* 59*  CREATININE 4.79* 5.60* 4.45*  CALCIUM 8.4*  --  8.2*   LFT Recent Labs    02/15/23 1745  PROT 6.2*  ALBUMIN 2.9*  AST 14*  ALT 10  ALKPHOS 68  BILITOT 0.4   PT/INR No results for input(s): "LABPROT", "INR" in the last 72 hours. Hepatitis Panel No results for input(s): "HEPBSAG", "HCVAB", "HEPAIGM", "HEPBIGM" in the last 72 hours.    IMPRESSION:   #9 52 year old white male with history of chronic normocytic anemia, prior history of some iron deficiency.  He has had progressive anemia over the past several months.  No evidence for any overt GI bleeding  Etiology of the anemia is most likely secondary to his progressive severe chronic kidney disease, however he does have history of grade D esophagitis on EGD in 2022 and also had adenomatous polyps at last colonoscopy in 2020 with only a fair prep and was therefore  recommended to have a follow-up in 1 year which did not occur. Cannot rule out component of chronic GI blood loss in this setting.  #2 insulin-dependent diabetes mellitus #3 severe hypertension #4 Stage 5 chronic kidney disease-undecided about dialysis #5 tachycardia-negative VQ scan  Plan; patient to have cardiology consult today Check iron studies  He should have endoscopic evaluation during this admission, with EGD and colonoscopy.  I discussed both procedures in detail with the patient including indications risk and benefits and he is agreeable to proceed.  Tachycardia and severe hypertension need to be sorted out/improved prior to sedation  Started on clear liquids tomorrow, and if other parameters have improved we can tentatively plan for endoscopic evaluation on Thursday.    Henrine Hayter PA-C 02/16/2023, 11:51 AM

## 2023-02-16 NOTE — Progress Notes (Addendum)
Daily Progress Note Intern Pager: [phone]  Patient name: [name] [name] Medical record number: [id number] Date of birth: 1970-12-25 Age: 52 y.o. Gender: male  Primary Care Provider: [name], MD Consultants: Cardiology  Code Status: Full code   Pt Overview and Major Events to Date:  3/25: Admitted, transfused 2 unit RBC  2/26: VQ scan pending   Assessment and Plan:  [name] [name] is a 52 y.o. male presenting with an episode of SOB and tachycardia.  Pertinent PMH/PSH includes T2DM, CKDV, PAD h/o BKA, chronic anemia, HTN, HLD.    * Tachycardia HR 100s on admission, baseline per patient. Denies palpitations or feeling his heart race this morning or overnight. Concern for PE given Wells score 4.5, D-dimer 1.53, will obtain VQ scan in lieu of CT PE given CKDV. Underlying anemia possibly contributing. Vitals stable and comfortable on RA. -F/u VQ scan -Cardiac monitoring -Vitals per routine -Cardiology consulted this morning to help rule out SVT v. A fib.  -IV Heparin per pharmacy pending VQ scan results   Normocytic normochromic anemia Hgb 6.8 on admission, MCV 86.6, baseline Hgb ~8-9. Likely in the setting of CKDV. Receiving 1u pRBC in ED. -Post-transfusion H&H pending -Trend CBC -Transfusion threshold <7 -Consult GI for colonoscopy   Stage 5 chronic kidney disease (HCC) Cr 4.79 > 4.45, baseline Cr 5-6. Pre-dialysis, planning for AVG/AVF placement but not starting HD per patient. Follows with [facility] S/p 1L LR bolus in ED. -Avoid nephrotoxic agents -Hold home Farxiga -F/U nephro outpatient  -Restart Lasix 40 mg  Essential hypertension Continues to be hypertensive. Compliant with home meds but reports continually elevated BP at home. -Consider restarting home Amlodipine 5mg  daily  -Restart Hydralazine 50mg  and increase to TID  Uncontrolled type 2 diabetes mellitus with hyperglycemia, with long-term [name] use of insulin (HCC) A1c 8.7 in  12/2022. Follows with [facility]. Has Omnipod at home, not using currently. No longer has Dexcom, has not been checking BGL at home. -vsSSI -CBG checks   FEN/GI: carb modified  PPx: Lovenox  Dispo:Home pending clinical improvement . Barriers include rule out of PE.   Subjective:  NAEO, patient resting comfortably. He reports his feeling of heart racing has improved. He denies CP and SOB.   Objective: Temp:  [98.2 F (36.8 C)-99.1 F (37.3 C)] 98.2 F (36.8 C) (03/26 0822) Pulse Rate:  [91-110] 92 (03/26 0822) Resp:  [11-19] 15 (03/26 0800) BP: (153-193)/(81-114) 193/90 (03/26 0938) SpO2:  [100 %] 100 % (03/26 0436) Weight:  [100.8 kg-108 kg] 100.8 kg (03/25 2321) Physical Exam: General: Well appearing, NAD  Cardiovascular: regular rate, regular rhythm no murmurs on exam  Respiratory: clear, no wheezing, no crackles. No increased WOB Abdomen: soft, non-tender, non-distended  Extremities: s/p right BKA, no peripheral edema   Laboratory: Most recent CBC Lab Results  Component Value Date   WBC 8.4 02/16/2023   HGB 8.4 (L) 02/16/2023   HCT 25.7 (L) 02/16/2023   MCV 84.9 02/16/2023   PLT 192 02/16/2023   Most recent BMP    Latest Ref Rng & Units 02/16/2023    2:34 AM  BMP  Glucose 70 - 99 mg/dL 298   BUN 6 - 20 mg/dL 59   Creatinine 0.61 - 1.24 mg/dL 4.45   Sodium 135 - 145 mmol/L 138   Potassium 3.5 - 5.1 mmol/L 3.9   Chloride 98 - 111 mmol/L 107   CO2 22 - 32 mmol/L 20   Calcium 8.9 - 10.3 mg/dL  8.2    [name] Current, DO 02/16/2023, 10:28 AM  PGY-1, [place] Intern pager: [phone], text pages welcome Secure chat group [place]

## 2023-02-16 NOTE — Consult Note (Addendum)
Cardiology Consultation   Patient ID: [name] [name] MRN: [id number]; DOB: [birth year]  Admit date: 02/15/2023 Date of Consult: 02/16/2023  PCP:  [name], [facility] Cardiologist:  None   {    Patient Profile:   [name] [name] is a 52 y.o. 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 Nori Riis.  History of Present Illness:   Mr. Stallman cardiac history significant for SVT.  Patient has no history of coronary artery disease and has normal heart function. He was initially seen in April 2023 for complaints of palpitations and shortness of breath and found to be in SVT.  He was eventually seen by Dr. Curt Bears who was managing him medically on diltiazem due to patient's desire of no procedures.  Last office visit was in November 2023 at which time he was reporting he was having having palpitations and fast rates at night but otherwise asymptomatic and not complaining of any chest pain, dizziness, syncope, shortness of breath.    Patient was admitted on 12/2022 at [facility] for osteomyelitis and underwent a R BKA then discharged on 3-week regimen of ciprofloxacin and Zyvox. Then on  02/12/2023 he presented to the [facility] ED again due to a near syncopal event; however after evaluation it was noted this was likely due to to severe dehydration as the patient was not drinking water and has known end-stage renal disease not on dialysis.  He was given IV fluids with improvement and discharged.    Now on 02/15/2023 patient is presenting to the emergency room department with complaints of shortness of breath and tachycardia.  Patient said he got up to walk to the door and started to feel his heart racing with palpitations and  minor shortness of breath, but denied any chest pain, weakness, dizziness, feeling lightheaded.  Patient says ever since his BKA he has noticed these tachycardic spells more frequently.  In the ED patient was found to be severely anemic with a hemoglobin of 6.8 and transfused with 1 unit of PRBCs and subsequently 2 more units during the night, now currently 8.4.  There was concern of pulmonary embolism due to recent surgery, limited mobility, elevated Wells score.  VQ scan negative for PE. Additionally patient is extremely hypertensive with blood pressures ranging from [id number] systolic with history of uncontrolled BP. It is uncertain exactly what EKG is classified as; however cannot exclude atrial flutter and given his medical history and recent events it could be likely. Heart rate initially 151, but now less than 100.    Past Medical History:  Diagnosis Date   Allergy    seasonal and environmental   Anemia    ARF (acute renal failure) ([place]) 09/2017   Chronic constipation 05/13/2020   CKD (chronic kidney disease) stage 4, GFR 15-29 ml/min (HCC)    Depression    Diabetes mellitus without complication ([place]) [id number]   GERD (gastroesophageal reflux disease)    Hyperlipidemia    Hypertension    Necrotizing fasciitis ([place]) 10/13/2017   Neuromuscular disorder (HCC)    neuropathy   PAD (peripheral artery disease) ([place])    Secondary hyperparathyroidism ([place])    Wound dehiscence 11/24/2017    Past Surgical History:  Procedure Laterality Date   AMPUTATION Right 10/13/2017  Procedure: RIGHT BELOW KNEE AMPUTATION;  Surgeon: [name], MD;  Location: [place];  Service: Orthopedics;  Laterality: Right;   AMPUTATION Right 11/24/2017   Procedure: AMPUTATION BELOW KNEE REVISION;  Surgeon: [name], MD;  Location: [place];  Service: Orthopedics;  Laterality: Right;   BIOPSY  09/02/2021   Procedure: BIOPSY;  Surgeon: Sharyn Creamer, MD;  Location[facility] END[facility]COPY;  Service: Gastroenterology;;    COLONOSCOPY  05/11/2019   ESOPHAGOGASTRODUODENOSCOPY (EGD) WITH PROPOFOL N/A 09/02/2021   Procedure: ESOPHAGOGASTRODUODENOSCOPY (EGD) WITH PROPOFOL;  Surgeon: Sharyn Creamer, MD;  Location: [place];  Service: Gastroenterology;  Laterality: N/A;   NO PAST SURGERIES     POLYPECTOMY     WISDOM TOOTH EXTRACTION       Inpatient Medications: Scheduled Meds:  amLODipine  5 mg Oral Daily   atorvastatin  40 mg Oral QHS   diltiazem  300 mg Oral Daily   furosemide  40 mg Oral Daily   gabapentin  300 mg Oral QHS   hydrALAZINE  50 mg Oral Q8H   insulin aspart  0-6 Units Subcutaneous TID WC   Continuous Infusions:  heparin Stopped (02/16/23 1059)   PRN Meds: acetaminophen **OR** acetaminophen, tamsulosin  Allergies:    Allergies  Allergen Reactions   Iron Nausea And Vomiting    With the iron tablets   Tape Rash    Rash at site of tape--okay to use paper tape    Social History:   Social History   Socioeconomic History   Marital status: Divorced    Spouse name: Not on file   Number of children: 1   Years of education: 12   Highest education level: Associate degree: occupational, Hotel manager, or vocational program  Occupational History   Occupation: unemployed  Tobacco Use   Smoking status: Every Day    Packs/day: 0.50    Years: 35.00    Additional pack years: 0.00    Total pack years: 17.50    Types: Cigarettes   Smokeless tobacco: Never  Vaping Use   Vaping Use: Never used  Substance and Sexual Activity   Alcohol use: No   Drug use: No   Sexual activity: Not on file  Other Topics Concern   Not on file  Social History Narrative   Originally from  [place].   Is living with his mother, who essentially supports him now.   Social Determinants of Health   Financial Resource Strain: Medium Risk (11/19/2017)   Overall Financial Resource Strain (CARDIA)    Difficulty of Paying Living Expenses: Somewhat hard  Food Insecurity: Not on file  Transportation Needs: Not  on file  Physical Activity: Not on file  Stress: Not on file  Social Connections: Not on file  Intimate Partner Violence: Not on file    Family History:    Family History  Problem Relation Age of Onset   Diabetes Mellitus II Mother    Depression Mother    Colon polyps Mother    Hypertension Mother    High Cholesterol Mother    Heart disease Father        CABG x 4.  04/2017   Diabetes Father    High Cholesterol Father    Hypertension Father    Heart disease Maternal Grandmother    Heart disease Maternal Grandfather    Colon cancer Neg Hx    Esophageal cancer Neg Hx    Liver cancer Neg Hx    Pancreatic cancer Neg Hx    Rectal  cancer Neg Hx    Stomach cancer Neg Hx      ROS:  Please see the history of present illness.  All other ROS reviewed and negative.     Physical Exam/Data:   Vitals:   02/16/23 0648 02/16/23 0800 02/16/23 0822 02/16/23 0938  BP: (!) 177/81 (!) 185/114  (!) 193/90  Pulse:   92   Resp: 17 15    Temp: 98.6 F (37 C)  98.2 F (36.8 C)   TempSrc:   Oral   SpO2:      Weight:      Height:        Intake/Output Summary (Last 24 hours) at 02/16/2023 1107 Last data filed at 02/16/2023 1100 Gross per 24 hour  Intake 1679.88 ml  Output 2150 ml  Net -470.12 ml      02/15/2023   11:21 PM 02/15/2023    4:54 PM 11/13/2022    1:31 PM  Last 3 Weights  Weight (lbs) 222 lb 3.6 oz 238 lb 252 lb  Weight (kg) 100.8 kg 107.956 kg 114.306 kg     Body mass index is 26.35 kg/m.  General:  Well nourished, well developed, in no acute distress HEENT: normal Neck: no JVD Vascular: No carotid bruits; Distal pulses 2+ bilaterally Cardiac:  normal S1, S2; RRR; no murmur  Lungs:  clear to auscultation bilaterally, no wheezing, rhonchi or rales  Abd: soft, nontender, no hepatomegaly  Ext: no edema Musculoskeletal:  No deformities, BUE and BLE strength normal and equal Skin: warm and dry  Neuro:  CNs 2-12 intact, no focal abnormalities noted Psych:  Normal  affect   EKG:  The EKG was personally reviewed and demonstrates: Sinus tachycardia with a heart rate of 151 Telemetry:  Telemetry was personally reviewed and demonstrates: Normal sinus rhythm with a heart rate of 85  Relevant CV Studies: Echocardiogram 03/31/2022  Left Ventricle: Left ventricular ejection fraction, by estimation, is 60  to 65%. The left ventricle has normal function. The left ventricle has no  regional wall motion abnormalities. The left ventricular internal cavity  size was normal in size. There is   mild left ventricular hypertrophy. Left ventricular diastolic parameters  were normal.   Right Ventricle: The right ventricular size is normal. Right ventricular  systolic function is normal. Tricuspid regurgitation signal is inadequate  for assessing PA pressure. The tricuspid regurgitant velocity is 2.39 m/s,  and with an assumed right atrial   pressure of 3 mmHg, the estimated right ventricular systolic pressure is  0000000 mmHg.   Left Atrium: Left atrial size was normal in size.   Right Atrium: Right atrial size was normal in size.   Pericardium: There is no evidence of pericardial effusion.   Mitral Valve: The mitral valve is normal in structure. No evidence of  mitral valve regurgitation. No evidence of mitral valve stenosis.   Tricuspid Valve: The tricuspid valve is normal in structure. Tricuspid  valve regurgitation is trivial. No evidence of tricuspid stenosis.   Aortic Valve: The aortic valve has an indeterminant number of cusps.  Aortic valve regurgitation is not visualized. No aortic stenosis is  present.   Pulmonic Valve: The pulmonic valve was normal in structure. Pulmonic valve  regurgitation is not visualized. No evidence of pulmonic stenosis.   Aorta: The aortic root is normal in size and structure.   Venous: The inferior vena cava is normal in size with greater than 50%  respiratory variability, suggesting right atrial pressure of 3 mmHg.  IAS/Shunts: No atrial level shunt detected by color flow Doppler.   Laboratory Data:  High Sensitivity Troponin:   Recent Labs  Lab 02/12/23 1720 02/12/23 1951 02/15/23 1745 02/15/23 1930  TROPONINIHS 19* 19* 16 18*     Chemistry Recent Labs  Lab 02/12/23 1720 02/12/23 1805 02/15/23 1745 02/15/23 1808 02/16/23 0234  NA 136   < > 142 143 138  K 3.7   < > 3.8 3.8 3.9  CL 93*   < > 108 110 107  CO2 23  --  19*  --  20*  GLUCOSE 223*   < > 195* 192* 298*  BUN 62*   < > 59* 56* 59*  CREATININE 6.11*   < > 4.79* 5.60* 4.45*  CALCIUM 8.9  --  8.4*  --  8.2*  MG 1.8  --   --   --   --   GFRNONAA 10*  --  14*  --  15*  ANIONGAP 20*  --  15  --  11   < > = values in this interval not displayed.    Recent Labs  Lab 02/12/23 1720 02/15/23 1745  PROT 6.7 6.2*  ALBUMIN 3.3* 2.9*  AST 18 14*  ALT 14 10  ALKPHOS 73 68  BILITOT 0.6 0.4   Lipids No results for input(s): "CHOL", "TRIG", "HDL", "LABVLDL", "LDLCALC", "CHOLHDL" in the last 168 hours.  Hematology Recent Labs  Lab 02/12/23 1720 02/12/23 1805 02/15/23 1745 02/15/23 1808 02/16/23 0234 02/16/23 0856  WBC 10.4  --  10.5  --  8.4  --   RBC 2.82*  --  2.54*  --  2.65*  --   HGB 7.3*   < > 6.8* 6.5* 7.0* 8.4*  HCT 24.1*   < > 22.0* 19.0* 22.5* 25.7*  MCV 85.5  --  86.6  --  84.9  --   MCH 25.9*  --  26.8  --  26.4  --   MCHC 30.3  --  30.9  --  31.1  --   RDW 14.6  --  15.1  --  14.8  --   PLT 110*  --  188  --  192  --    < > = values in this interval not displayed.   Thyroid No results for input(s): "TSH", "FREET4" in the last 168 hours.  BNPNo results for input(s): "BNP", "PROBNP" in the last 168 hours.  DDimer  Recent Labs  Lab 02/15/23 1745  DDIMER 1.53*     Radiology/Studies:  DG Chest Port 1 View  Result Date: 02/15/2023 CLINICAL DATA:  Shortness of breath EXAM: PORTABLE CHEST 1 VIEW COMPARISON:  Chest x-ray 02/12/2023 FINDINGS: The heart size and mediastinal contours are within normal  limits. Both lungs are clear. The visualized skeletal structures are unremarkable. IMPRESSION: No active disease. Electronically Signed   By: Ronney Asters M.D.   On: 02/15/2023 17:44   DG Chest Portable 1 View  Result Date: 02/12/2023 CLINICAL DATA:  sob EXAM: PORTABLE CHEST 1 VIEW COMPARISON:  Chest x-ray 01/14/2023, CT chest 09/09/2022 FINDINGS: The heart and mediastinal contours are unchanged. Low lung volumes. No focal consolidation. Slightly coarsened interstitial markings most prominent within the lower lobes. No pleural effusion. No pneumothorax. No acute osseous abnormality. IMPRESSION: Bilateral lower lobe bronchitic changes. Electronically Signed   By: Iven Finn M.D.   On: 02/12/2023 17:49     Assessment and Plan:   Sinus tachycardia vs Atrial flutter with history of SVT Patient with chronic  complaints of SVT that was previously managed on diltiazem now presenting to the emergency department for complaints of palpitations and shortness of breath that has worsened since his admission in Feb 2024 for R BKA due to osteomyelitis. EKG showing narrow complex tachycardia with rates in the 140s, P waves difficult to visualize, possible SVT vs Aflutter. Rates have improved and is in clear NSR after blood transfusions and IV fluids.   Continue PO cardizem 300mg  daily Will need zio patch to monitor for any arrhythmias at discharge.   CHA2DS2-VASc 2 score=3 (HTN, DM, minimal atherosclerotic calcification on abdominal aorta on prior CT) indicating need for anticoagulation if truly Aflutter. However, currently undergoing GI workup for acute on chronic anemia with plans of EGD and colonoscopy. Would wait for this before starting any anticoagulation. If no clear a-flutter on telemetry during remainder of admission, may need to wait for outpatient monitor to help differentiate SVT vs flutter.  Last echocardiogram on May 2023 noted a normal EF.  Defer to MD if need to reassess.  Hypertension Blood  pressure has remained persistently high between [id number] systolic.  Likely to be related to severe anemia and ESRD.   Home medications include amlodipine 5 mg daily, diltiazem 300 mg daily, hydralazine 50 mg BID- inpatient team have increased to TID Stop amlodipine 5 mg daily and start carvedilol 6.25mg  daily  Severe anemia requiring multiple transfusions CKD stage V Uncontrolled insulin-dependent type 2 diabetes Defer to primary team    Risk Assessment/Risk Scores:    For questions or updates, please contact [place] Please consult [email or web address] for contact info under    Signed, Bonnee Quin, PA-C  02/16/2023 11:07 AM

## 2023-02-17 DIAGNOSIS — M79672 Pain in left foot: Secondary | ICD-10-CM | POA: Insufficient documentation

## 2023-02-17 LAB — GLUCOSE, CAPILLARY
Glucose-Capillary: 157 mg/dL — ABNORMAL HIGH (ref 70–99)
Glucose-Capillary: 194 mg/dL — ABNORMAL HIGH (ref 70–99)
Glucose-Capillary: 187 mg/dL — ABNORMAL HIGH (ref 70–99)
Glucose-Capillary: 231 mg/dL — ABNORMAL HIGH (ref 70–99)

## 2023-02-17 LAB — IRON AND TIBC
Iron: 80 ug/dL (ref 45–182)
Saturation Ratios: 36 % (ref 17.9–39.5)
TIBC: 224 ug/dL — ABNORMAL LOW (ref 250–450)
UIBC: 144 ug/dL

## 2023-02-17 LAB — HEMOGLOBIN AND HEMATOCRIT, BLOOD
HCT: 26.3 % — ABNORMAL LOW (ref 39.0–52.0)
Hemoglobin: 8.3 g/dL — ABNORMAL LOW (ref 13.0–17.0)

## 2023-02-17 LAB — BPAM RBC
Blood Product Expiration Date: 202404152359
Blood Product Expiration Date: 202404152359
ISSUE DATE / TIME: 202403252112
ISSUE DATE / TIME: 202403260413
Unit Type and Rh: 600
Unit Type and Rh: 600

## 2023-02-17 LAB — TYPE AND SCREEN
ABO/RH(D): A NEG
Antibody Screen: NEGATIVE
Unit division: 0
Unit division: 0

## 2023-02-17 LAB — FERRITIN: Ferritin: 47 ng/mL (ref 24–336)

## 2023-02-17 MED ORDER — OXYCODONE HCL 5 MG PO TABS
5.0000 mg | ORAL_TABLET | Freq: Once | ORAL | Status: AC | PRN
  Administered 2023-02-17: 5 mg via ORAL
  Filled 2023-02-17: qty 1

## 2023-02-17 MED ORDER — PANTOPRAZOLE SODIUM 40 MG PO TBEC
40.0000 mg | DELAYED_RELEASE_TABLET | Freq: Every day | ORAL | Status: DC
  Administered 2023-02-17 – 2023-02-18 (×2): 40 mg via ORAL
  Filled 2023-02-17 (×2): qty 1

## 2023-02-17 MED ORDER — ACETAMINOPHEN 650 MG RE SUPP: 650.0000 mg | Freq: Four times a day (QID) | RECTAL | Status: DC

## 2023-02-17 MED ORDER — ACETAMINOPHEN 325 MG PO TABS
650.0000 mg | ORAL_TABLET | Freq: Four times a day (QID) | ORAL | Status: DC
  Administered 2023-02-17 – 2023-02-18 (×4): 650 mg via ORAL
  Filled 2023-02-17 (×4): qty 2

## 2023-02-17 NOTE — Progress Notes (Signed)
Progress Note   Subjective  Patient doing well. States HR controlled. Again denies any bleeding symptoms. Iron studies okay. Denies complaints.   Objective   Vital signs in last 24 hours: Temp:  [98.3 F (36.8 C)-99 F (37.2 C)] 98.4 F (36.9 C) (03/27 1052) Pulse Rate:  [77-108] 81 (03/27 1052) Resp:  [10-20] 17 (03/27 1052) BP: (137-168)/(82-100) 137/82 (03/27 1052) SpO2:  [94 %-99 %] 97 % (03/27 1052)   General:    white male in NAD Neurologic:  Alert and oriented,  grossly normal neurologically. Psych:  Cooperative. Normal mood and affect.  Intake/Output from previous day: 03/26 0701 - 03/27 0700 In: 1534.9 [P.O.:1440; I.V.:94.9] Out: 3100 [Urine:3100] Intake/Output this shift: Total I/O In: 476 [P.O.:476] Out: 600 [Urine:600]  Lab Results: Recent Labs    02/15/23 1745 02/15/23 1808 02/16/23 0234 02/16/23 0856 02/16/23 1511 02/17/23 0019  WBC 10.5  --  8.4  --   --   --   HGB 6.8*   < > 7.0* 8.4* 8.0* 8.3*  HCT 22.0*   < > 22.5* 25.7* 24.2* 26.3*  PLT 188  --  192  --   --   --    < > = values in this interval not displayed.   BMET Recent Labs    02/15/23 1745 02/15/23 1808 02/16/23 0234  NA 142 143 138  K 3.8 3.8 3.9  CL 108 110 107  CO2 19*  --  20*  GLUCOSE 195* 192* 298*  BUN 59* 56* 59*  CREATININE 4.79* 5.60* 4.45*  CALCIUM 8.4*  --  8.2*   LFT Recent Labs    02/15/23 1745  PROT 6.2*  ALBUMIN 2.9*  AST 14*  ALT 10  ALKPHOS 68  BILITOT 0.4   PT/INR No results for input(s): "LABPROT", "INR" in the last 72 hours.  Studies/Results: NM Pulmonary Perfusion  Result Date: 02/16/2023 CLINICAL DATA:  Pulmonary embolism suspected. Low to intermediate probability. Abnormal D-dimer. Shortness of breath. Chest pain. EXAM: NUCLEAR MEDICINE PERFUSION LUNG SCAN TECHNIQUE: Perfusion images were obtained in multiple projections after intravenous injection of radiopharmaceutical. Ventilation scans intentionally deferred if perfusion scan and  chest x-ray adequate for interpretation during COVID 19 epidemic. RADIOPHARMACEUTICALS:  4.0 mCi Tc-56m MAA IV COMPARISON:  Chest radiography yesterday. FINDINGS: Normal perfusion lung scan. No small or large defects. No sign of pleural disease. IMPRESSION: Normal examination.  No small or large defects. Electronically Signed   By: Nelson Chimes M.D.   On: 02/16/2023 11:43   DG Chest Port 1 View  Result Date: 02/15/2023 CLINICAL DATA:  Shortness of breath EXAM: PORTABLE CHEST 1 VIEW COMPARISON:  Chest x-ray 02/12/2023 FINDINGS: The heart size and mediastinal contours are within normal limits. Both lungs are clear. The visualized skeletal structures are unremarkable. IMPRESSION: No active disease. Electronically Signed   By: Ronney Asters M.D.   On: 02/15/2023 17:44       Assessment / Plan:    52 y/o male here with the following:  Symptomatic anemia Tachycardia Stage V CKD History of esophagitis  Hemoglobin stable posttransfusion.  Again he is not have any GI symptoms that bother him, no overt GI blood loss.  I reviewed his prior endoscopic workup.  He does have a history of severe esophagitis in 2022 without follow-up endoscopy, has not been on PPI in a while, has been on it since has been in the hospital.  His last colonoscopy was a double prep but good views obtained, 2 and  half years ago, no high risk pathology.  Again his anemia may well likely be due to his progressive CKD, he is not have any overt GI blood loss.  That being said given his history esophagitis and has been off PPI, I offered him an upper endoscopy to clear his upper tract.  I think yield of a colonoscopy for him will be quite low, also a preparation for him will be quite challenging (he has needed double prep in the past).  I discussed options with him, following discussion of risks and benefits of each of these, we will plan on EGD tomorrow, holding off on colonoscopy for now.  Will keep n.p.o. after midnight, can eat regular  diet today.  His heart rate otherwise seems stable, cleared by cardiology to proceed.  PLAN: - resumed renal diet for today - NPO after MN - EGD tomorrow  - continue empiric protonix  Jolly Mango, MD [facility]

## 2023-02-17 NOTE — H&P (View-Only) (Signed)
Progress Note   Subjective  Patient doing well. States HR controlled. Again denies any bleeding symptoms. Iron studies okay. Denies complaints.   Objective   Vital signs in last 24 hours: Temp:  [98.3 F (36.8 C)-99 F (37.2 C)] 98.4 F (36.9 C) (03/27 1052) Pulse Rate:  [77-108] 81 (03/27 1052) Resp:  [10-20] 17 (03/27 1052) BP: (137-168)/(82-100) 137/82 (03/27 1052) SpO2:  [94 %-99 %] 97 % (03/27 1052)   General:    white male in NAD Neurologic:  Alert and oriented,  grossly normal neurologically. Psych:  Cooperative. Normal mood and affect.  Intake/Output from previous day: 03/26 0701 - 03/27 0700 In: 1534.9 [P.O.:1440; I.V.:94.9] Out: 3100 [Urine:3100] Intake/Output this shift: Total I/O In: 476 [P.O.:476] Out: 600 [Urine:600]  Lab Results: Recent Labs    02/15/23 1745 02/15/23 1808 02/16/23 0234 02/16/23 0856 02/16/23 1511 02/17/23 0019  WBC 10.5  --  8.4  --   --   --   HGB 6.8*   < > 7.0* 8.4* 8.0* 8.3*  HCT 22.0*   < > 22.5* 25.7* 24.2* 26.3*  PLT 188  --  192  --   --   --    < > = values in this interval not displayed.   BMET Recent Labs    02/15/23 1745 02/15/23 1808 02/16/23 0234  NA 142 143 138  K 3.8 3.8 3.9  CL 108 110 107  CO2 19*  --  20*  GLUCOSE 195* 192* 298*  BUN 59* 56* 59*  CREATININE 4.79* 5.60* 4.45*  CALCIUM 8.4*  --  8.2*   LFT Recent Labs    02/15/23 1745  PROT 6.2*  ALBUMIN 2.9*  AST 14*  ALT 10  ALKPHOS 68  BILITOT 0.4   PT/INR No results for input(s): "LABPROT", "INR" in the last 72 hours.  Studies/Results: NM Pulmonary Perfusion  Result Date: 02/16/2023 CLINICAL DATA:  Pulmonary embolism suspected. Low to intermediate probability. Abnormal D-dimer. Shortness of breath. Chest pain. EXAM: NUCLEAR MEDICINE PERFUSION LUNG SCAN TECHNIQUE: Perfusion images were obtained in multiple projections after intravenous injection of radiopharmaceutical. Ventilation scans intentionally deferred if perfusion scan and  chest x-ray adequate for interpretation during COVID 19 epidemic. RADIOPHARMACEUTICALS:  4.0 mCi Tc-70m MAA IV COMPARISON:  Chest radiography yesterday. FINDINGS: Normal perfusion lung scan. No small or large defects. No sign of pleural disease. IMPRESSION: Normal examination.  No small or large defects. Electronically Signed   By: Nelson Chimes M.D.   On: 02/16/2023 11:43   DG Chest Port 1 View  Result Date: 02/15/2023 CLINICAL DATA:  Shortness of breath EXAM: PORTABLE CHEST 1 VIEW COMPARISON:  Chest x-ray 02/12/2023 FINDINGS: The heart size and mediastinal contours are within normal limits. Both lungs are clear. The visualized skeletal structures are unremarkable. IMPRESSION: No active disease. Electronically Signed   By: Ronney Asters M.D.   On: 02/15/2023 17:44       Assessment / Plan:    52 y/o male here with the following:  Symptomatic anemia Tachycardia Stage V CKD History of esophagitis  Hemoglobin stable posttransfusion.  Again he is not have any GI symptoms that bother him, no overt GI blood loss.  I reviewed his prior endoscopic workup.  He does have a history of severe esophagitis in 2022 without follow-up endoscopy, has not been on PPI in a while, has been on it since has been in the hospital.  His last colonoscopy was a double prep but good views obtained, 2 and  half years ago, no high risk pathology.  Again his anemia may well likely be due to his progressive CKD, he is not have any overt GI blood loss.  That being said given his history esophagitis and has been off PPI, I offered him an upper endoscopy to clear his upper tract.  I think yield of a colonoscopy for him will be quite low, also a preparation for him will be quite challenging (he has needed double prep in the past).  I discussed options with him, following discussion of risks and benefits of each of these, we will plan on EGD tomorrow, holding off on colonoscopy for now.  Will keep n.p.o. after midnight, can eat regular  diet today.  His heart rate otherwise seems stable, cleared by cardiology to proceed.  PLAN: - resumed renal diet for today - NPO after MN - EGD tomorrow  - continue empiric protonix  Jolly Mango, MD [facility]

## 2023-02-17 NOTE — Progress Notes (Signed)
Daily Progress Note Intern Pager: [phone]  Patient name: [name] [name] Medical record number: [id number] Date of birth: 1971-06-30 Age: 52 y.o. Gender: male  Primary Care Provider: [name], MD Consultants: Cardiology, GI Code Status: Full code  Pt Overview and Major Events to Date:  3/25: Admitted, transfused 2 unit RBCs 3/26: VQ scan negative, heparin discontinued 3/28: Colonoscopy and EGD planned  Assessment and Plan:  [name] [name] is a 52 y.o. male presenting with an episode of SOB and tachycardia.  Pertinent PMH/PSH includes T2DM, CKDV, PAD h/o BKA, chronic anemia, HTN, HLD.     * Tachycardia HR stable for last 24 hours.  Denies new onset CP or SOB.  VQ scan negative for PE, DC'd heparin. -Cardiac monitoring -Vitals per routine -Cardiology consulted, appreciate recommendations -Continuing diltiazem -Zio patch outpatient  Normocytic normochromic anemia Hgb stable 8.3 s/p 2 units RBCs.  Will need to rule out acute GI bleed, most likely secondary to chronic disease.  TIBC 224, ferritin 47, iron 80. -Trend CBC -Transfusion threshold <7 -GI consulted, appreciate recommendations -EGD and colonoscopy pending  Stage 5 chronic kidney disease (HCC) Cr 4.79 > 4.45, baseline Cr 5-6. Pre-dialysis, planning for AVG/AVF placement but not starting HD per patient. Follows with [facility] S/p 1L LR bolus in ED. -Avoid nephrotoxic agents -Hold home Farxiga -F/U nephro outpatient  -Restart Lasix 40 mg  Essential hypertension Continues to be hypertensive. Compliant with home meds but reports continually elevated BP at home. -Continue hydralazine 50 mg every 8 hours -Continue Coreg 6.25 mg (3/26) -Continue diltiazem 300 mg daily  Uncontrolled type 2 diabetes mellitus with hyperglycemia, with long-term current use of insulin (HCC) A1c 8.7 in 12/2022. Follows with [place]. Has Omnipod at home, not using currently. No longer has Dexcom, has not been  checking BGL at home. Received 4 units of short acting insulin in 24 hours. -vsSSI -CBG checks  Foot pain, left Chronic s/p ambulation of left great toe 3 weeks ago - scheduled tylenol Q6H  - oxycodone 5 mg once PRN     FEN/GI: Clear liquid diet, pending GI procedure 3/28, n.p.o. at midnight PPx: Lovenox Dispo:Home pending clinical improvement . Barriers include ongoing medical workup requiring procedures.   Subjective:  No acute events overnight, patient resting comfortably in room.  No new complaints of shortness of breath or chest pain.  He reports having consistent pain in his left toe s/p amputation 3 weeks ago.  Objective: Temp:  [98.2 F (36.8 C)-99 F (37.2 C)] 98.5 F (36.9 C) (03/27 0700) Pulse Rate:  [77-108] 88 (03/27 0700) Resp:  [10-20] 19 (03/27 0700) BP: (144-193)/(87-100) 153/97 (03/27 0700) SpO2:  [94 %-99 %] 97 % (03/27 0700) Physical Exam: Well-appearing, no acute distress Cardio: Regular rate, regular rhythm, no murmurs on exam. Pulm: Clear, no wheezing, no crackles. No increased work of breathing Abdominal: bowel sounds present, soft, non-tender, non-distended Extremities: no peripheral edema, s/p right AKA, bandage in place on left foot   Neuro: alert and oriented x3, speech normal in content, no facial asymmetry  Laboratory: Most recent CBC Lab Results  Component Value Date   WBC 8.4 02/16/2023   HGB 8.3 (L) 02/17/2023   HCT 26.3 (L) 02/17/2023   MCV 84.9 02/16/2023   PLT 192 02/16/2023   Most recent BMP    Latest Ref Rng & Units 02/16/2023    2:34 AM  BMP  Glucose 70 - 99 mg/dL 298   BUN 6 - 20 mg/dL 59  Creatinine 0.61 - 1.24 mg/dL 4.45   Sodium 135 - 145 mmol/L 138   Potassium 3.5 - 5.1 mmol/L 3.9   Chloride 98 - 111 mmol/L 107   CO2 22 - 32 mmol/L 20   Calcium 8.9 - 10.3 mg/dL 8.2    Darci Current, DO 02/17/2023, 8:16 AM  PGY-1, [place] Intern pager: [phone], text pages welcome Secure chat group [place]

## 2023-02-17 NOTE — Progress Notes (Signed)
Physical Therapy Treatment Patient Details Name: [name] [name] MRN: [id number] DOB: Jan 02, 1971 Today's Date: 02/17/2023   History of Present Illness Pt is a 52 y.o. male admitted 3/35 with SOB and tachycardia. In ED, Hgb found to be 6.8. Pt received 1 unit PRBCs. Pt recently returned home from [facility] following L toe amputations. PMH: T2DM, CKDV, PAD h/o R BKA, chronic anemia, HTN, HLD    PT Comments    Pt received in supine, agreeable to therapy session with encouragement, with emphasis on gait and stair training. Pt making good progress toward goals, able to perform all tasks with fair balance using RW, gait distance limited due to mild c/o dizziness and pt somewhat orthostatic, RN notified. Pt states he has not been up in the chair today but was not agreeable to sit up in recliner post-exertion. Pt continues to benefit from PT services to progress toward functional mobility goals.  Orthostatic BPs  Supine 150/84 HR 81  Standing after 5 mins 120/77 HR 90  After return to supine 143/87 HR 81     Recommendations for follow up therapy are one component of a multi-disciplinary discharge planning process, led by the attending physician.  Recommendations may be updated based on patient status, additional functional criteria and insurance authorization.  Follow Up Recommendations       Assistance Recommended at Discharge PRN  Patient can return home with the following A little help with bathing/dressing/bathroom;Assistance with cooking/housework;Assist for transportation;Help with stairs or ramp for entrance   Equipment Recommendations  None recommended by PT    Recommendations for Other Services       Precautions / Restrictions Precautions Precautions: Fall;Other (comment) Precaution Comments: watch HR/BP Required Braces or Orthoses: Other Brace Other Brace: RLE prosthesis, L post-op shoe Restrictions Weight Bearing Restrictions: Yes LLE Weight Bearing: Weight  bearing as tolerated     Mobility  Bed Mobility Overal bed mobility: Modified Independent             General bed mobility comments: pt using bed features    Transfers Overall transfer level: Needs assistance Equipment used: Rolling walker (2 wheels) Transfers: Sit to/from Stand Sit to Stand: Min guard, From elevated surface           General transfer comment: Uses Bed elevated to assist with sit>stands per pt request, he states his bed at home is also elevated.    Ambulation/Gait Ambulation/Gait assistance: Supervision Gait Distance (Feet): 140 Feet Assistive device: Rolling walker (2 wheels) Gait Pattern/deviations: Step-through pattern, Decreased stride length       General Gait Details: steady gait with RW. HR stable. Pt c/o dizziness halfway through trial, upon return to room orthostatics checked and BP had dropped to 120/77. SpO2 WFL on RA. Pt reports 7/10 modified RPE post-exertion.   Stairs Stairs: Yes Stairs assistance: Min guard Stair Management: Two rails, Step to pattern, Forwards (RW to simulate bilateral rails with platform step in room) Number of Stairs: 4 General stair comments: 7" platform in room, cues for safety/sequencing, no LOB.   Wheelchair Mobility    Modified Rankin (Stroke Patients Only)       Balance Overall balance assessment: Needs assistance Sitting-balance support: No upper extremity supported, Feet supported Sitting balance-Leahy Scale: Good     Standing balance support: Bilateral upper extremity supported, During functional activity, Reliant on assistive device for balance Standing balance-Leahy Scale: Fair Standing balance comment: static standing with single UE support no LOB, pt reliant on RW for dynamic standing tasks  Cognition Arousal/Alertness: Awake/alert Behavior During Therapy: Flat affect Overall Cognitive Status: Within Functional Limits for tasks assessed                                  General Comments: Pt minimally conversant; following simple commands well.        Exercises Other Exercises Other Exercises: verbal/visual review for supine HEP including ankle pumps, heel slides, quad sets, SLR and frequency. Pt defers to demo back during session.    General Comments General comments (skin integrity, edema, etc.): BP 150/84 supine prior to session, then 120/77 (87) standing post-ambulation; BP 143/87 (104) after return to supine. Encouraged pt to sit up in chair later in the evening or following date for improved BP and to work on supine BLE HEP for improved hemodynamics.      Pertinent Vitals/Pain Pain Assessment Pain Assessment: 0-10 Pain Score: 7  Pain Location: L foot Pain Descriptors / Indicators: Discomfort Pain Intervention(s): Monitored during session, Repositioned, Limited activity within patient's tolerance    Home Living                          Prior Function            PT Goals (current goals can now be found in the care plan section) Acute Rehab PT Goals Patient Stated Goal: home PT Goal Formulation: With patient Time For Goal Achievement: 03/02/23 Progress towards PT goals: Progressing toward goals    Frequency    Min 1X/week      PT Plan Current plan remains appropriate       AM-PAC PT "6 Clicks" Mobility   Outcome Measure  Help needed turning from your back to your side while in a flat bed without using bedrails?: None Help needed moving from lying on your back to sitting on the side of a flat bed without using bedrails?: None Help needed moving to and from a bed to a chair (including a wheelchair)?: A Little Help needed standing up from a chair using your arms (e.g., wheelchair or bedside chair)?: A Little Help needed to walk in hospital room?: A Little Help needed climbing 3-5 steps with a railing? : A Little 6 Click Score: 20    End of Session Equipment Utilized During  Treatment: Gait belt Activity Tolerance: Patient tolerated treatment well;Other (comment) (orthostatic hypotension and pain limiting standing tolerance but participated well despite these symptoms) Patient left: in bed;with call bell/phone within reach;with bed alarm set;Other (comment) (HOB elevated to simulate chair posture) Nurse Communication: Mobility status;Other (comment) (orthostatic hypotension, may need RLE ace wrapped) PT Visit Diagnosis: Difficulty in walking, not elsewhere classified (R26.2);Muscle weakness (generalized) (M62.81)     Time: 1710-1730 PT Time Calculation (min) (ACUTE ONLY): 20 min  Charges:  $Gait Training: 8-22 mins                     Casmere Hollenbeck P., PTA Acute Rehabilitation Services Secure Chat Preferred 9a-5:30pm Office: [place] 02/17/2023, 6:20 PM

## 2023-02-17 NOTE — Progress Notes (Signed)
Per patient's instructions- I cleansed left foot incision where his toe was amputated with sterile water and applied dry gauze with kerlix and ace bandage.

## 2023-02-17 NOTE — Progress Notes (Addendum)
Rounding Note    Patient Name: [name] [name] Date of Encounter: 02/17/2023  [facility] Cardiologist: Dr. Curt Bears  Subjective   No acute overnight events. Maintaining sinus rhythm with morning with rates mostly in the 70s to 80s. No cardiac complaints this morning. Breathing improved. No recurrent palpitations. No chest pain.  Inpatient Medications    Scheduled Meds:  acetaminophen  650 mg Oral Q6H   Or   acetaminophen  650 mg Rectal Q6H   atorvastatin  40 mg Oral QHS   carvedilol  6.25 mg Oral BID WC   diltiazem  300 mg Oral Daily   enoxaparin (LOVENOX) injection  30 mg Subcutaneous Q24H   furosemide  40 mg Oral Daily   gabapentin  300 mg Oral QHS   hydrALAZINE  50 mg Oral Q8H   insulin aspart  0-6 Units Subcutaneous TID WC   Continuous Infusions:  PRN Meds: oxyCODONE, tamsulosin   Vital Signs    Vitals:   02/17/23 0100 02/17/23 0200 02/17/23 0405 02/17/23 0700  BP: (!) 145/92 (!) 155/88  (!) 153/97  Pulse: 77 78 80 88  Resp: 10 12 16 19   Temp:   98.3 F (36.8 C) 98.5 F (36.9 C)  TempSrc:   Oral Oral  SpO2: 98% 98% 98% 97%  Weight:      Height:        Intake/Output Summary (Last 24 hours) at 02/17/2023 0834 Last data filed at 02/17/2023 0804 Gross per 24 hour  Intake 1723.99 ml  Output 3300 ml  Net -1576.01 ml      02/15/2023   11:21 PM 02/15/2023    4:54 PM 11/13/2022    1:31 PM  Last 3 Weights  Weight (lbs) 222 lb 3.6 oz 238 lb 252 lb  Weight (kg) 100.8 kg 107.956 kg 114.306 kg      Telemetry    Normal sinus rhythm with rates in the 70s to 80s. - Personally Reviewed  ECG    No new ECG tracing today.  - Personally Reviewed  Physical Exam   GEN: Caucasian male in no acute distress.   Neck: No JVD. Cardiac: RRR. No murmurs, rubs, or gallops.  Respiratory: Clear to auscultation bilaterally. No wheezes, rhonchi, or rales appreciated. GI: Soft, non-distended, and non-tender.  MS: No edema. S/p right below knee amputation and  amputation of left big toe. Neuro:  No focal deficits.  Psych: Flat affect.  Labs    High Sensitivity Troponin:   Recent Labs  Lab 02/12/23 1720 02/12/23 1951 02/15/23 1745 02/15/23 1930  TROPONINIHS 19* 19* 16 18*     Chemistry Recent Labs  Lab 02/12/23 1720 02/12/23 1805 02/15/23 1745 02/15/23 1808 02/16/23 0234  NA 136   < > 142 143 138  K 3.7   < > 3.8 3.8 3.9  CL 93*   < > 108 110 107  CO2 23  --  19*  --  20*  GLUCOSE 223*   < > 195* 192* 298*  BUN 62*   < > 59* 56* 59*  CREATININE 6.11*   < > 4.79* 5.60* 4.45*  CALCIUM 8.9  --  8.4*  --  8.2*  MG 1.8  --   --   --   --   PROT 6.7  --  6.2*  --   --   ALBUMIN 3.3*  --  2.9*  --   --   AST 18  --  14*  --   --  ALT 14  --  10  --   --   ALKPHOS 73  --  68  --   --   BILITOT 0.6  --  0.4  --   --   GFRNONAA 10*  --  14*  --  15*  ANIONGAP 20*  --  15  --  11   < > = values in this interval not displayed.    Lipids No results for input(s): "CHOL", "TRIG", "HDL", "LABVLDL", "LDLCALC", "CHOLHDL" in the last 168 hours.  Hematology Recent Labs  Lab 02/12/23 1720 02/12/23 1805 02/15/23 1745 02/15/23 1808 02/16/23 0234 02/16/23 0856 02/16/23 1511 02/17/23 0019  WBC 10.4  --  10.5  --  8.4  --   --   --   RBC 2.82*  --  2.54*  --  2.65*  --   --   --   HGB 7.3*   < > 6.8*   < > 7.0* 8.4* 8.0* 8.3*  HCT 24.1*   < > 22.0*   < > 22.5* 25.7* 24.2* 26.3*  MCV 85.5  --  86.6  --  84.9  --   --   --   MCH 25.9*  --  26.8  --  26.4  --   --   --   MCHC 30.3  --  30.9  --  31.1  --   --   --   RDW 14.6  --  15.1  --  14.8  --   --   --   PLT 110*  --  188  --  192  --   --   --    < > = values in this interval not displayed.   Thyroid No results for input(s): "TSH", "FREET4" in the last 168 hours.  BNPNo results for input(s): "BNP", "PROBNP" in the last 168 hours.  DDimer  Recent Labs  Lab 02/15/23 1745  DDIMER 1.53*     Radiology    NM Pulmonary Perfusion  Result Date: 02/16/2023 CLINICAL DATA:   Pulmonary embolism suspected. Low to intermediate probability. Abnormal D-dimer. Shortness of breath. Chest pain. EXAM: NUCLEAR MEDICINE PERFUSION LUNG SCAN TECHNIQUE: Perfusion images were obtained in multiple projections after intravenous injection of radiopharmaceutical. Ventilation scans intentionally deferred if perfusion scan and chest x-ray adequate for interpretation during COVID 19 epidemic. RADIOPHARMACEUTICALS:  4.0 mCi Tc-95m MAA IV COMPARISON:  Chest radiography yesterday. FINDINGS: Normal perfusion lung scan. No small or large defects. No sign of pleural disease. IMPRESSION: Normal examination.  No small or large defects. Electronically Signed   By: Nelson Chimes M.D.   On: 02/16/2023 11:43   DG Chest Port 1 View  Result Date: 02/15/2023 CLINICAL DATA:  Shortness of breath EXAM: PORTABLE CHEST 1 VIEW COMPARISON:  Chest x-ray 02/12/2023 FINDINGS: The heart size and mediastinal contours are within normal limits. Both lungs are clear. The visualized skeletal structures are unremarkable. IMPRESSION: No active disease. Electronically Signed   By: Ronney Asters M.D.   On: 02/15/2023 17:44    Cardiac Studies   Echocardiogram 03/31/2022: Impressions: 1. Left ventricular ejection fraction, by estimation, is 60 to 65%. The  left ventricle has normal function. The left ventricle has no regional  wall motion abnormalities. There is mild left ventricular hypertrophy.  Left ventricular diastolic parameters  were normal.   2. Right ventricular systolic function is normal. The right ventricular  size is normal. Tricuspid regurgitation signal is inadequate for assessing  PA pressure.   3. The  mitral valve is normal in structure. No evidence of mitral valve  regurgitation. No evidence of mitral stenosis.   4. The aortic valve has an indeterminant number of cusps. Aortic valve  regurgitation is not visualized. No aortic stenosis is present.   5. The inferior vena cava is normal in size with greater  than 50%  respiratory variability, suggesting right atrial pressure of 3 mmHg.   Comparison(s): No prior Echocardiogram.    Patient Profile     52 y.o. male with a history of SVT, hypertension, hyperlipidemia, type 2 diabetes mellitus on insulin. CKD stage V (declined dialysis), chronic anemia, recent osteomyelitis s/p right below knee amputation in 12/2022, and tobacco abuse who presented on 02/15/2023 with shortness of breath and tachycardia and was found to have acute on chronic anemia with hemoglobin as low as 6.5. S/p multiple unit of PRBCs. Cardiology consulted for further evaluation of tachycardia.  Assessment & Plan    Tachycardia Possible Atrial Flutter with RVR History of SVT Patient has a history of SVT. He has declined ablation in the past so this has been managed with AV nodal agents. Echo in 03/2022 showed normal LV function. He reports increased episode of tachypalpitations mostly with exertion since his BKA in 12/2022. Presented for further evaluation of shortness of breath and tachycardia and was found to have acute on chronic anemia with hemoglobin as low as 6.5. EKG on admission showed a narrow complex tachycardia with rate of 144 bpm. Possible atrial flutter with RVR but difficult to tell with elevated heart rate. He was given IV fluids and multiple units of PRBCs with improvement heart rate. As heart rate dropped, he was clearly in sinus rhythm. Upon review of telemetry, there was a sharp drop in his heart rate rather than a gradual decline which makes me think initial rhythm was a atrial tachycardia such as SVT or atrial flutter.  - Maintaining sinus rhythm this morning with rates in the 70s to 80s. - Continue home PO Diltiazem 300mg  daily. - Continue Coreg 6.25mg  twice daily (started on 3/26 for additional BP control). - CHA2DS2-VASc = 3 (HTN, DM, and aortic atherosclerosis noted on prior CT). No anticoagulation for now given acute on chronic anemia and need for GI work-up. Plan  is for possible EGD/ colonoscopy tomorrow.  - Will also try to review EKG strip with EP to help determine whether this was atrial flutter with RVR or SVT.   Demand Ischemia High-sensitivity troponin borderline elevated at 16 >> 18.  - No chest pain. - Not consistent with ACS. Consistent with demand ischemia in setting of tachycardia, elevated BP, and underlying CKD stage V. No ischemic work-up planned.  Hypertension BP as high as the 190s/90s on admission. BP still elevated but improved after some medication adjustments.  - Systolic BP in the [id number] to 160s. - Continue home Diltiazem 300mg  daily. - Continue Hydralazine 50mg  three times daily (increased from home dose of twice daily). - Continue Coreg 6.25mg  twice daily (started yesterday).  - BP usually much better controlled. Normally in the 130-140/80s per reviewed of outpatient notes. OK to continue current doses of medications for now since adjustments were just made yesterday. Continue to monitor BP and can up-titrate medications if needed.  Hyperlipidemia - Continue Lipitor 40mg  daily.  Type 2 Diabetes Mellitus - On Farxiga and Insulin at home. - Management per primary team.   Acute on Chronic Anemia Hemoglobin as low as 6.5. S/p 3 units of PRBCs. Iron and ferritin normal. TIBC low at  224.  - Hemoglobin stable at 8.3 today.  - GI following and plan is for possible EGD/colonoscopy tomorrow. OK to proceed with this from a cardiac standpoint - no additional testing needed. - Management per GI and primary team.  CKD Stage V Patient has known CKD stage V and has declined dialysis.  - On home Lasix 40mg  daily.  He states this is to help him urinate.  - Management per primary team or Nephrology.   For questions or updates, please contact [place] Please consult [email or web address] for contact info under        Signed, Darreld Mclean, PA-C  02/17/2023, 8:34 AM     Patient seen and examined with CG PA-C.  Agree as  above, with the following exceptions and changes as noted below.  Overall well with flat affect.  No recurrence of atrial arrhythmia or narrow complex tachycardia on monitor gen: NAD, CV: RRR, no murmurs, Lungs: clear, Abd: soft, Extrem: Right BKA, left leg dressed neuro/Psych: alert and oriented x 3, flat affect. All available labs, radiology testing, previous records reviewed.  Continue current cardiac management, will revisit options for anticoagulation and monitoring after GI studies complete.  I would favor the patient wearing a cardiac monitor home-going and monitoring for recurrence of atrial arrhythmia, if found can consider anticoagulation discussion with EP cardiologist.  Elouise Munroe, MD 02/17/23 11:48 AM

## 2023-02-17 NOTE — Assessment & Plan Note (Signed)
Chronic s/p ambulation of left great toe 3 weeks ago - scheduled tylenol Q6H  - oxycodone 5 mg once PRN

## 2023-02-17 NOTE — TOC Initial Note (Signed)
Transition of Care [facility]) - Initial/Assessment Note    Patient Details  Name: [name] [name] MRN: [id number] Date of Birth: [birth year]  Transition of Care [facility]) CM/SW Contact:    Cyndi Bender, RN Phone Number: 02/17/2023, 10:21 AM  Clinical Narrative:                  Spoke to patient regarding transition needs.  Patient states he was active with [facility] for PT, RN home health prior to admission.  Cheryl with [facility] notified of admission.  Patient has all needed DME. TOC will continue to follow for needs.  Expected Discharge Plan: [place] Barriers to Discharge: Continued Medical Work up   Patient Goals and CMS Choice Patient states their goals for this hospitalization and ongoing recovery are:: return home CMS Medicare.gov Compare Post Acute Care list provided to:: Patient Choice offered to / list presented to : Patient      Expected Discharge Plan and Services   Discharge Planning Services: CM Consult Post Acute Care Choice: Home Health                             HH Arranged: RN, PT [facility] Agency: [place] Date [place]: 02/17/23 Time [place]: 58 Representative spoke with at [place]: [place] Arrangements/Services     Patient language and need for interpreter reviewed:: Yes Do you feel safe going back to the place where you live?: Yes      Need for Family Participation in Patient Care: Yes (Comment) Care giver support system in place?: Yes (comment)   Criminal Activity/Legal Involvement Pertinent to Current Situation/Hospitalization: No - Comment as needed  Activities of Daily Living      Permission Sought/Granted                  Emotional Assessment   Attitude/Demeanor/Rapport: Gracious Affect (typically observed): Accepting Orientation: : Oriented to Self, Oriented to Place, Oriented to  Time, Oriented to Situation Alcohol / Substance Use: Not  Applicable Psych Involvement: No (comment)  Admission diagnosis:  SOB (shortness of breath) [R06.02] Tachycardia [R00.0] Patient Active Problem List   Diagnosis Date Noted   Foot pain, left 02/17/2023   SOB (shortness of breath) 02/16/2023   History of esophagitis 02/16/2023   Tachycardia 02/15/2023   Allergic rhinitis 11/07/2022   Healthcare maintenance 11/07/2022   Stage 5 chronic kidney disease ([place]) 09/11/2022   Tobacco dependence 09/11/2022   GERD with esophagitis 09/02/2021   AKI (acute kidney injury) ([place]) 04/10/2021   Achilles tendon contracture, left 03/03/2018   Non-pressure chronic ulcer of other part of left foot limited to breakdown of skin ([place]) 02/02/2018   History of right below knee amputation ([place]) 12/22/2017   Acute posthemorrhagic anemia    [place] 11/24/2017   Essential hypertension 11/24/2017   Normocytic normochromic anemia 11/24/2017   Uncontrolled type 2 diabetes mellitus with hyperglycemia, with long-term current use of insulin ([place]) 10/11/2017   ARF (acute renal failure) ([place]) 10/11/2017   PCP:  [name], MD Pharmacy:   [facility], [place] - [address] AT [place] [address] [place] [address] Phone: [phone] [phone]     Social Determinants of Health (SDOH) Social History: SDOH Screenings   Financial Resource Strain: Medium Risk (11/19/2017)  Tobacco Use: High Risk (02/15/2023)   SDOH Interventions:     Readmission Risk Interventions  No data to display

## 2023-02-18 ENCOUNTER — Encounter (HOSPITAL_COMMUNITY): Payer: Self-pay | Admitting: Family Medicine

## 2023-02-18 ENCOUNTER — Encounter (HOSPITAL_COMMUNITY): Payer: Self-pay

## 2023-02-18 ENCOUNTER — Other Ambulatory Visit: Payer: Self-pay | Admitting: Student

## 2023-02-18 ENCOUNTER — Other Ambulatory Visit (HOSPITAL_COMMUNITY): Payer: Self-pay

## 2023-02-18 ENCOUNTER — Inpatient Hospital Stay (HOSPITAL_COMMUNITY): Payer: 59 | Admitting: Certified Registered Nurse Anesthetist

## 2023-02-18 ENCOUNTER — Encounter (HOSPITAL_COMMUNITY)
Admission: EM | Disposition: A | Discharge status: Home / Self Care | Payer: Self-pay | Source: Home / Self Care | Attending: Family Medicine

## 2023-02-18 DIAGNOSIS — K2100 Gastro-esophageal reflux disease with esophagitis, without bleeding: Secondary | ICD-10-CM

## 2023-02-18 DIAGNOSIS — E1165 Type 2 diabetes mellitus with hyperglycemia: Secondary | ICD-10-CM

## 2023-02-18 DIAGNOSIS — K449 Diaphragmatic hernia without obstruction or gangrene: Secondary | ICD-10-CM

## 2023-02-18 DIAGNOSIS — F17210 Nicotine dependence, cigarettes, uncomplicated: Secondary | ICD-10-CM

## 2023-02-18 DIAGNOSIS — I10 Essential (primary) hypertension: Secondary | ICD-10-CM

## 2023-02-18 DIAGNOSIS — D649 Anemia, unspecified: Secondary | ICD-10-CM

## 2023-02-18 DIAGNOSIS — K2289 Other specified disease of esophagus: Secondary | ICD-10-CM

## 2023-02-18 DIAGNOSIS — Z794 Long term (current) use of insulin: Secondary | ICD-10-CM

## 2023-02-18 DIAGNOSIS — I4892 Unspecified atrial flutter: Secondary | ICD-10-CM

## 2023-02-18 HISTORY — PX: BIOPSY: SHX5522

## 2023-02-18 HISTORY — PX: ESOPHAGOGASTRODUODENOSCOPY (EGD) WITH PROPOFOL: SHX5813

## 2023-02-18 LAB — GLUCOSE, CAPILLARY
Glucose-Capillary: 206 mg/dL — ABNORMAL HIGH (ref 70–99)
Glucose-Capillary: 168 mg/dL — ABNORMAL HIGH (ref 70–99)

## 2023-02-18 LAB — CBC
HCT: 26 % — ABNORMAL LOW (ref 39.0–52.0)
Hemoglobin: 8.5 g/dL — ABNORMAL LOW (ref 13.0–17.0)
MCH: 27.3 pg (ref 26.0–34.0)
MCHC: 32.7 g/dL (ref 30.0–36.0)
MCV: 83.6 fL (ref 80.0–100.0)
Platelets: 213 10*3/uL (ref 150–400)
RBC: 3.11 MIL/uL — ABNORMAL LOW (ref 4.22–5.81)
RDW: 15 % (ref 11.5–15.5)
WBC: 7.7 10*3/uL (ref 4.0–10.5)
nRBC: 0 % (ref 0.0–0.2)

## 2023-02-18 SURGERY — ESOPHAGOGASTRODUODENOSCOPY (EGD) WITH PROPOFOL
Anesthesia: Monitor Anesthesia Care

## 2023-02-18 MED ORDER — PROPOFOL 500 MG/50ML IV EMUL
INTRAVENOUS | Status: DC | PRN
  Administered 2023-02-18: 125 ug/kg/min via INTRAVENOUS

## 2023-02-18 MED ORDER — HYDRALAZINE HCL 50 MG PO TABS
50.0000 mg | ORAL_TABLET | Freq: Three times a day (TID) | ORAL | 0 refills | Status: DC
  Filled 2023-02-18: qty 90, 30d supply, fill #0

## 2023-02-18 MED ORDER — ACETAMINOPHEN 325 MG PO TABS: 650.0000 mg | ORAL_TABLET | Freq: Four times a day (QID) | ORAL | Status: DC

## 2023-02-18 MED ORDER — SODIUM CHLORIDE 0.9 % IV SOLN: INTRAVENOUS | Status: DC | PRN

## 2023-02-18 MED ORDER — PROPOFOL 10 MG/ML IV BOLUS
INTRAVENOUS | Status: DC | PRN
  Administered 2023-02-18: 30 mg via INTRAVENOUS
  Administered 2023-02-18 (×2): 20 mg via INTRAVENOUS

## 2023-02-18 MED ORDER — CARVEDILOL 6.25 MG PO TABS
6.2500 mg | ORAL_TABLET | Freq: Two times a day (BID) | ORAL | 0 refills | Status: DC
  Filled 2023-02-18: qty 60, 30d supply, fill #0

## 2023-02-18 MED ORDER — SODIUM CHLORIDE 0.9 % IV SOLN: Freq: Once | INTRAVENOUS | Status: AC

## 2023-02-18 SURGICAL SUPPLY — 15 items

## 2023-02-18 NOTE — Discharge Summary (Signed)
[facility] Discharge Summary  Patient name: [name] [name] Medical record number: [id number] Date of birth: 09/05/1971 Age: 52 y.o. Gender: male Date of Admission: 02/15/2023  Date of Discharge: 02/18/23  Admitting Physician: Dickie La, MD  Primary Care Provider: [name], MD Consultants: GI, Cardiology   Indication for Hospitalization: SOB w/ Palpitations   Discharge Diagnoses/Problem List:  Principal Problem for Admission: Shortness of breath, palpitations, anemia Other Problems addressed during stay:  Principal Problem:   Tachycardia Active Problems:   Normocytic normochromic anemia   Essential hypertension   Stage 5 chronic kidney disease ([place])   Uncontrolled type 2 diabetes mellitus with hyperglycemia, with long-term current use of insulin (HCC)   SOB (shortness of breath)   History of esophagitis   Foot pain, left   Mucosal abnormality of esophagus    Brief Hospital Course:  [name] [name] is a 52 y.o.male with a history of T2DM, CKDV, PAD with R BKA, chronic anemia, HTN and HLD who was admitted to the [facility] Medicine Teaching Service at [facility] for SOB and tachycardia. His hospital course is detailed below:  Tachycardia Hypertension Presented to the ED with episode of SOB and palpitations. Given tachycardia and dyspnea, patient requiring PE imaging but needs VQ scan given kidney function. Patient was started on IV heparin pending VQ scan. Repeat H&H resulted 8.4. VQ scan showed no evidence of PE, heparin was discontinued. Cardiology was consulted and recommended Zio patch upon discharge and transitioning from Amlodipine to Coreg. Patient continued to be hypertensive, likely in the setting of CKDV, and his home Hydralazine was titrated to 50mg TID.  At discharge his heart rate was stable on Coreg and diltiazem.  He was scheduled to follow-up with cardiology outpatient for monitor and with EP.  Anemia In the ED, Hgb was 6.8 and  patient was symptomatic and received 2u pRBCs. Hgb improved to 8.4, GI was consulted and recommend EGD and colonoscopy as patient was due to outpatient screening any way. Colonoscopy determined to be low yield given challenging prep. Patient underwent EGD on 3/29 which showed normal stomach duodenum.  Ruled out GI etiology for anemia.  Most likely this is secondary to chronic disease.  GI to follow-up outpatient.  At discharge his hemoglobin was stable at 8.5.   Other chronic conditions were medically managed with home medications and formulary alternatives as necessary (HLD, neuropathy)  PCP Follow-up Recommendations:  Obtain Dexcom or other means of CBG monitoring F/u Nephro for AVG/AVF scheduling Outpatient colonoscopy Recheck CBC outpatient to evaluate hemoglobin  Disposition: Home with home health  Discharge Condition: Medically stable  Discharge Exam:  Vitals:   02/18/23 1216 02/18/23 1226  BP: 124/83 127/84  Pulse: 73 76  Resp: 12 11  Temp:    SpO2: 97% 97%   General: well appearing, no acute distress  Cardiovascular: tachycardic, regular rhythm, no murmurs on exam  Respiratory: clear, no wheezing. No worsening SOB Abdomen: soft, non-tender, non-distended  Extremities: no peripheral edema, s/p right AKA, bandage in place on left foot   Significant Procedures: EGD: 3/28 normal with no acute findings  Significant Labs and Imaging:  Recent Labs  Lab 02/16/23 1511 02/17/23 0019 02/18/23 0010  WBC  --   --  7.7  HGB 8.0* 8.3* 8.5*  HCT 24.2* 26.3* 26.0*  PLT  --   --  213   No results for input(s): "NA", "K", "CL", "CO2", "GLUCOSE", "BUN", "CREATININE", "CALCIUM", "MG", "PHOS", "ALKPHOS", "AST", "ALT", "ALBUMIN", "PROTEIN" in the last  48 hours.  CXR: Bilateral lower lobe bronchiectasis changes consistent with prior studies  VQ scan: Negative for pulmonary embolism  Results/Tests Pending at Time of Discharge: None  Discharge Medications:  Allergies as of 02/18/2023        Reactions   Iron Nausea And Vomiting   With the iron tablets   Tape Rash   Rash at site of tape--okay to use paper tape        Medication List     STOP taking these medications    amLODipine 10 MG tablet Commonly known as: NORVASC   amLODipine 5 MG tablet Commonly known as: NORVASC   Farxiga 10 MG Tabs tablet Generic drug: dapagliflozin propanediol       TAKE these medications    acetaminophen 325 MG tablet Commonly known as: TYLENOL Take 2 tablets (650 mg total) by mouth every 6 (six) hours.   AgaMatrix Ultra-Thin Lancets Misc Check blood sugars before meals twice daily   atorvastatin 40 MG tablet Commonly known as: LIPITOR Take 1 tablet (40 mg total) by mouth at bedtime.   baclofen 10 MG tablet Commonly known as: LIORESAL 1/2 tab twice a day What changed:  how much to take how to take this when to take this   calcitRIOL 0.5 MCG capsule Commonly known as: ROCALTROL Take 0.5 mcg by mouth in the morning and at bedtime.   carvedilol 6.25 MG tablet Commonly known as: COREG Take 1 tablet (6.25 mg total) by mouth 2 (two) times daily with a meal.   diltiazem 300 MG 24 hr capsule Commonly known as: CARDIZEM CD TAKE 1 CAPSULE(300 MG) BY MOUTH AT BEDTIME   furosemide 40 MG tablet Commonly known as: LASIX Take 40 mg by mouth daily.   gabapentin 300 MG capsule Commonly known as: NEURONTIN Take 300 mg by mouth daily.   glucose blood test strip 1 each by Other route as needed for other. Use as instructed   glucose blood test strip Commonly known as: AgaMatrix Presto Test Check blood sugars twice daily before meals   HumaLOG KwikPen 100 UNIT/ML KwikPen Generic drug: insulin lispro Inject 2-8 Units into the skin 3 (three) times daily as needed (hyperglycemia). Dose per sliding scale   hydrALAZINE 50 MG tablet Commonly known as: APRESOLINE Take 1 tablet (50 mg total) by mouth every 8 (eight) hours. What changed: when to take this   Omnipod  DASH Pods (Gen 4) Misc SMARTSIG:SUB-Q Every 3 Days   ondansetron 4 MG tablet Commonly known as: ZOFRAN Take 1 tablet (4 mg total) by mouth 3 (three) times daily as needed for nausea or vomiting.   pantoprazole 40 MG tablet Commonly known as: PROTONIX Take 40 mg by mouth daily.   tamsulosin 0.4 MG Caps capsule Commonly known as: FLOMAX Take 0.4 mg by mouth at bedtime.        Discharge Instructions: Please refer to Patient Instructions section of EMR for full details.  Patient was counseled important signs and symptoms that should prompt return to medical care, changes in medications, dietary instructions, activity restrictions, and follow up appointments.   Follow-Up Appointments:  Follow-up Information     [name], MD. Go on 02/22/2023.   Specialty: Family Medicine Why: At 1:50 pm. This is your hospital follow up with your primary care doctor. Please arrive 15 minutes early. Contact information: [place] [address] [phone]         [facility], [facility]. Call.   Specialty: Nephrology Why: Call to  make a hospital follow up with your nephrologist at [facility]. Contact information: [address] [place] [address] [place], Dante, DO 02/18/2023, 12:51 PM PGY-1, [place]

## 2023-02-18 NOTE — Interval H&P Note (Signed)
History and Physical Interval Note: Hgb stable - no bleeding symptoms HR stable. Here for EGD to clear his upper tract given history of anemia and history of esophagitis. I have discussed risks /benefits and he wishes to proceed. Further recommendations pending the results.  02/18/2023 11:17 AM  [name] [name]  has presented today for surgery, with the diagnosis of anemia, history of esophagitis.  The various methods of treatment have been discussed with the patient and family. After consideration of risks, benefits and other options for treatment, the patient has consented to  Procedure(s): ESOPHAGOGASTRODUODENOSCOPY (EGD) WITH PROPOFOL (N/A) as a surgical intervention.  The patient's history has been reviewed, patient examined, no change in status, stable for surgery.  I have reviewed the patient's chart and labs.  Questions were answered to the patient's satisfaction.     [place]

## 2023-02-18 NOTE — Transfer of Care (Signed)
Immediate Anesthesia Transfer of Care Note  Patient: [name] [name]  Procedure(s) Performed: ESOPHAGOGASTRODUODENOSCOPY (EGD) WITH PROPOFOL BIOPSY  Patient Location: Endoscopy Unit  Anesthesia Type:MAC  Level of Consciousness: awake, alert , and oriented  Airway & Oxygen Therapy: Patient Spontanous Breathing  Post-op Assessment: Report given to RN and Post -op Vital signs reviewed and stable  Post vital signs: Reviewed and stable  Last Vitals: see postop VS flowsheet Vitals Value Taken Time  BP    Temp    Pulse    Resp    SpO2      Last Pain:  Vitals:   02/18/23 1107  TempSrc:   PainSc: 4          Complications: No notable events documented.

## 2023-02-18 NOTE — Progress Notes (Signed)
Patient and sister susan verbalized understanding of dc instructions. Will pick up toc meds on our way out to the main entrance. All belongings given patient.

## 2023-02-18 NOTE — Progress Notes (Addendum)
   Reviewed telemetry which showed sinus sinus rhythm with rates mostly in the 70s to 80s (occasionally in the low 100s). No recurrent atrial flutter. GI work-up pending. Dr. Margaretann Loveless spoke with Dr. Curt Bears, patient's primary Cardiologist, and unless he has recurrent atrial flutter on telemetry will plan to get outpatient monitor and arrange follow-up with EP. Can decide on chronic anticoagulation at that time based on results of GI work-up and monitor.  Darreld Mclean, PA-C 02/18/2023 11:03 AM  ADDENDUM: There was no cause for his anemia on EGD today. He has been discharged. We were unable to place live monitor before he left so will mail a 2 week live Zio to his house. Will also send message to schedulers to help arrange a follow-up visit with EP in 4-6 weeks.  Darreld Mclean, PA-C 02/18/2023 8:17 PM

## 2023-02-18 NOTE — Anesthesia Preprocedure Evaluation (Signed)
Anesthesia Evaluation  Patient identified by MRN, date of birth, ID band Patient awake    Reviewed: Allergy & Precautions, NPO status , Patient's Chart, lab work & pertinent test results  Airway Mallampati: II  TM Distance: >3 FB Neck ROM: Full    Dental no notable dental hx.    Pulmonary Current Smoker   Pulmonary exam normal breath sounds clear to auscultation       Cardiovascular hypertension, + Peripheral Vascular Disease  Normal cardiovascular exam Rhythm:Regular Rate:Normal     Neuro/Psych    Depression    negative neurological ROS  negative psych ROS   GI/Hepatic Neg liver ROS,GERD  ,,  Endo/Other  diabetes, Poorly Controlled, Insulin Dependent    Renal/GU Renal InsufficiencyRenal disease  negative genitourinary   Musculoskeletal negative musculoskeletal ROS (+)    Abdominal   Peds negative pediatric ROS (+)  Hematology  (+) Blood dyscrasia, anemia   Anesthesia Other Findings   Reproductive/Obstetrics negative OB ROS                             Anesthesia Physical Anesthesia Plan  ASA: 3  Anesthesia Plan: MAC   Post-op Pain Management:    Induction: Intravenous  PONV Risk Score and Plan: 1 and Propofol infusion  Airway Management Planned: Nasal Cannula  Additional Equipment:   Intra-op Plan:   Post-operative Plan:   Informed Consent: I have reviewed the patients History and Physical, chart, labs and discussed the procedure including the risks, benefits and alternatives for the proposed anesthesia with the patient or authorized representative who has indicated his/her understanding and acceptance.     Dental advisory given  Plan Discussed with: CRNA and Surgeon  Anesthesia Plan Comments:         Anesthesia Quick Evaluation

## 2023-02-18 NOTE — Anesthesia Procedure Notes (Signed)
Procedure Name: MAC Date/Time: 02/18/2023 11:41 AM  Performed by: Carolan Clines, CRNAPre-anesthesia Checklist: Patient identified, Emergency Drugs available, Suction available and Patient being monitored Patient Re-evaluated:Patient Re-evaluated prior to induction Oxygen Delivery Method: Nasal cannula Dental Injury: Teeth and Oropharynx as per pre-operative assessment

## 2023-02-18 NOTE — Progress Notes (Signed)
Ordered 2 week live monitor for further evaluation of atrial flutter. Please see note from today for more information. Dr. Curt Bears is patient's primary Cardiologist and can read.   Darreld Mclean, PA-C 02/18/2023 8:05 PM

## 2023-02-18 NOTE — TOC Transition Note (Signed)
Transition of Care [facility]) - CM/SW Discharge Note   Patient Details  Name: [name] [name] MRN: [id number] Date of Birth: [birth year]  Transition of Care [facility]) CM/SW Contact:  Zenon Mayo, RN Phone Number: 02/18/2023, 12:56 PM   Clinical Narrative:    Patient is for dc today, Previous NCM set patient up with [facility], THis NCM notified Malachy Mood with [facility] of dc today. Follow up apt on AVS.      Barriers to Discharge: Continued Medical Work up   Patient Goals and CMS Choice CMS Medicare.gov Compare Post Acute Care list provided to:: Patient Choice offered to / list presented to : Patient  Discharge Placement                         Discharge Plan and Services Additional resources added to the After Visit Summary for     Discharge Planning Services: CM Consult Post Acute Care Choice: Home Health                    HH Arranged: RN, PT [facility] Agency: [place] Date [place]: 02/17/23 Time [place]: 1021 Representative spoke with at [place]: [place] Determinants of Health ([place]) Interventions SDOH Screenings   Financial Resource Strain: Medium Risk (11/19/2017)  Tobacco Use: High Risk (02/18/2023)     Readmission Risk Interventions     No data to display

## 2023-02-18 NOTE — Op Note (Signed)
[facility] Main Patient Name: [name] [name] Procedure Date : 02/18/2023 MRN: [id number] Attending MD: [name] [name]. [name] [name] , MD, [id number] Date of Birth: 08-23-1971 CSN: [id number] Age: 52 Admit Type: Inpatient Procedure:                Upper GI endoscopy Indications:              anemia in the setting of CKD, normal iron studies,                            no overt bleeding. Colonoscopy up to date. History                            of severe reflux esophagitis in recent years                            without follow up endoscopy since then. Rule out                            upper tract cause of anemia and reassess esophagus                            for interval healing Providers:                [name] [name] P. [name] Moros, MD, [name] Carrel, RN,                            [name] [name], Technician Referring MD:              Medicines:                Monitored Anesthesia Care Complications:            No immediate complications. Estimated blood loss:                            Minimal. Estimated Blood Loss:     Estimated blood loss was minimal. Procedure:                Pre-Anesthesia Assessment:                           - Prior to the procedure, a History and Physical                            was performed, and patient medications and                            allergies were reviewed. The patient's tolerance of                            previous anesthesia was also reviewed. The risks                            and benefits of the procedure and the sedation  options and risks were discussed with the patient.                            All questions were answered, and informed consent                            was obtained. Prior Anticoagulants: The patient has                            taken no anticoagulant or antiplatelet agents. ASA                            Grade Assessment: III - A patient with severe                             systemic disease. After reviewing the risks and                            benefits, the patient was deemed in satisfactory                            condition to undergo the procedure.                           After obtaining informed consent, the endoscope was                            passed under direct vision. Throughout the                            procedure, the patient's blood pressure, pulse, and                            oxygen saturations were monitored continuously. The                            [id number]-[id number] Olympus endoscope was introduced                            through the mouth, and advanced to the second part                            of duodenum. The upper GI endoscopy was                            accomplished without difficulty. The patient                            tolerated the procedure well. Scope In: Scope Out: Findings:      Esophagogastric landmarks were identified: the Z-line was found at 39       cm, the gastroesophageal junction was found at 39 cm and the upper       extent of the gastric folds was found at 41 cm from  the incisors.      A 2 cm hiatal hernia was present.      A superficial whitish plaque was in the lower third of the esophagus, 35       to 37 cm from the incisors. Another similar plaque was noted a few cm       proximal to it as well. Benign appearing. Not concerning for Barrett's.       Biopsies were taken with a cold forceps for histology.      The exam of the esophagus was otherwise normal.      The entire examined stomach was normal.      The examined duodenum was normal. Impression:               - Esophagogastric landmarks identified.                           - 2 cm hiatal hernia.                           - Plaques in the lower third of the esophagus -                            benign appearing. Biopsied.                           - Normal stomach.                           - Normal examined duodenum.                            No cause for anemia noted on EGD. Interval healing                            of esophagitis. Normal iron studies. No overt blood                            loss. Brown stools. I think less likely his GI                            tract is causing anemia, more likely due to                            progressive CKD. I think yield of colonoscopy would                            be low, his exam is up to date, and it took a 2 day                            prep to get him ready for the last exam which would                            be difficult for this patient. Recommendation:           - Return patient to hospital ward for ongoing  care.                           - Advance diet as tolerated.                           - Continue present medications.                           - Await pathology results.                           - No further GI workup planned during this                            admission unless he has overt GI bleeding                           - We can follow him up once he is out of the                            hospital                           - We will sign off for now, call with questions in                            the interim. Procedure Code(s):        --- Professional ---                           [phone], Esophagogastroduodenoscopy, flexible,                            transoral; with biopsy, single or multiple Diagnosis Code(s):        --- Professional ---                           K44.9, Diaphragmatic hernia without obstruction or                            gangrene                           K22.89, Other specified disease of esophagus                           K21.00, Gastro-esophageal reflux disease with                            esophagitis, without bleeding CPT copyright 2022 American Medical Association. All rights reserved. The codes documented in this report are preliminary and upon coder review may  be revised to meet current  compliance requirements. [name] [name] P. [name] Partridge, MD 02/18/2023 12:05:34 PM This report has been signed electronically. Number of Addenda: 0

## 2023-02-18 NOTE — Care Management Important Message (Signed)
Important Message  Patient Details  Name: [name] [name] MRN: [id number] Date of Birth: 1971-04-16   Medicare Important Message Given:  Yes     Orbie Pyo 02/18/2023, 2:19 PM

## 2023-02-18 NOTE — Progress Notes (Signed)
Physical Therapy Treatment Patient Details Name: [name] [name] MRN: [id number] DOB: 04-Feb-1971 Today's Date: 02/18/2023   History of Present Illness Pt is a 52 y.o. male admitted 3/35 with SOB and tachycardia. In ED, Hgb found to be 6.8. Pt received 1 unit PRBCs. Pt recently returned home from [facility] following L toe amputations. PMH: T2DM, CKDV, PAD h/o R BKA, chronic anemia, HTN, HLD    PT Comments    Pt received up in room preparing for DC, pt agreeable to therapy session with emphasis on standing balance, RW use and stair negotiation. Pt denies dizziness with standing tasks this date and needing up to min guard for safety with stair ascent/descent and backward stepping in room, but mostly Supervision using RW. Pt defers orthostatics due to lack of symptoms and agreeable to short gait trial in room after performing stairs. Pt lunch arriving at end of session and pt defers longer hallway distance gait trial, requesting to eat prior to DC. Reviewed use of ace wraps on RLE and compression socks on LLE for improved hemodynamics. Pt continues to benefit from PT services to progress toward functional mobility goals.    Recommendations for follow up therapy are one component of a multi-disciplinary discharge planning process, led by the attending physician.  Recommendations may be updated based on patient status, additional functional criteria and insurance authorization.  Follow Up Recommendations       Assistance Recommended at Discharge PRN  Patient can return home with the following A little help with bathing/dressing/bathroom;Assistance with cooking/housework;Assist for transportation;Help with stairs or ramp for entrance   Equipment Recommendations  None recommended by PT    Recommendations for Other Services       Precautions / Restrictions Precautions Precautions: Fall;Other (comment) Precaution Comments: watch HR/BP Required Braces or Orthoses: Other Brace Other  Brace: RLE prosthesis, L post-op shoe Restrictions Weight Bearing Restrictions: Yes LLE Weight Bearing: Weight bearing as tolerated     Mobility  Bed Mobility Overal bed mobility: Modified Independent             General bed mobility comments: received up in room ad lib    Transfers Overall transfer level: Needs assistance Equipment used: Rolling walker (2 wheels) Transfers: Sit to/from Stand Sit to Stand: Modified independent (Device/Increase time)           General transfer comment: to/from EOB and chair heights with RW, no dizziness reported upon standing this date.    Ambulation/Gait Ambulation/Gait assistance: Supervision Gait Distance (Feet): 30 Feet Assistive device: Rolling walker (2 wheels) Gait Pattern/deviations: Step-through pattern, Decreased stride length Gait velocity: decreased     General Gait Details: steady gait with RW for forward stepping, but mild instability when pt turning/stepping backward, pt self-corrects with Supervision and no overt LOB. Pt without c/o dizziness this date.   Stairs Stairs: Yes Stairs assistance: Min guard Stair Management: Two rails, Step to pattern, Forwards (RW to simulate bilateral rails with platform step in room) Number of Stairs: 4 General stair comments: 7" platform in room, cues for safety/sequencing, no LOB.   Wheelchair Mobility    Modified Rankin (Stroke Patients Only)       Balance Overall balance assessment: Needs assistance Sitting-balance support: No upper extremity supported, Feet supported Sitting balance-Leahy Scale: Good     Standing balance support: Bilateral upper extremity supported, During functional activity, Reliant on assistive device for balance Standing balance-Leahy Scale: Fair Standing balance comment: Pt reliant on RW for dynamic standing tasks  Cognition Arousal/Alertness: Awake/alert Behavior During Therapy: Flat affect Overall  Cognitive Status: Within Functional Limits for tasks assessed                                 General Comments: Pt minimally conversant; following simple commands well.        Exercises Other Exercises Other Exercises: reviewed seated/standing HEP but pt defers handout, stating [place] services will be coming to his house this week.    General Comments        Pertinent Vitals/Pain Pain Assessment Pain Assessment: Faces Faces Pain Scale: Hurts little more Pain Location: L foot Pain Descriptors / Indicators: Discomfort Pain Intervention(s): Monitored during session, Repositioned (pt defers pain meds)     PT Goals (current goals can now be found in the care plan section) Acute Rehab PT Goals Patient Stated Goal: home PT Goal Formulation: With patient Time For Goal Achievement: 03/02/23 Progress towards PT goals: Progressing toward goals    Frequency    Min 1X/week      PT Plan Current plan remains appropriate       AM-PAC PT "6 Clicks" Mobility   Outcome Measure  Help needed turning from your back to your side while in a flat bed without using bedrails?: None Help needed moving from lying on your back to sitting on the side of a flat bed without using bedrails?: None Help needed moving to and from a bed to a chair (including a wheelchair)?: A Little Help needed standing up from a chair using your arms (e.g., wheelchair or bedside chair)?: A Little Help needed to walk in hospital room?: A Little Help needed climbing 3-5 steps with a railing? : A Little 6 Click Score: 20    End of Session Equipment Utilized During Treatment: Gait belt Activity Tolerance: Patient tolerated treatment well;Other (comment) (limited session time due to pt wanting to eat lunch) Patient left: with call bell/phone within reach;Other (comment);in chair (RN aware pt up in chair) Nurse Communication: Mobility status;Other (comment) (orthostatic hypotension, may need RLE ace  wrapped) PT Visit Diagnosis: Difficulty in walking, not elsewhere classified (R26.2);Muscle weakness (generalized) (M62.81)     Time: [id number] PT Time Calculation (min) (ACUTE ONLY): 10 min  Charges:  $Gait Training: 8-22 mins                     Hester Joslin P., PTA Acute Rehabilitation Services Secure Chat Preferred 9a-5:30pm Office: [place] 02/18/2023, 2:17 PM

## 2023-02-18 NOTE — Progress Notes (Signed)
PT Cancellation Note  Patient Details Name: [name] [name] MRN: [id number] DOB: 06/18/1971   Cancelled Treatment:    Reason Eval/Treat Not Completed: (P) Patient at procedure or test/unavailable (Pt off floor (@ Endoscopy dept).) Will continue efforts per PT plan of care later in day as schedule permits.   Damian Buckles M Zhane Bluitt 02/18/2023, 11:45 AM

## 2023-02-18 NOTE — Progress Notes (Addendum)
Daily Progress Note Intern Pager: [phone]  Patient name: [name] [name] Medical record number: [id number] Date of birth: Aug 07, 1971 Age: 52 y.o. Gender: male  Primary Care Provider: [name], MD Consultants: Cardiology, GI  Code Status: Full code   Pt Overview and Major Events to Date:  3/25: Admitted, transfused 2 unit RBCs 3/26: VQ scan negative, heparin discontinued 3/28: EGD planned  Assessment and Plan:  [name] [name] is a 52 y.o. male presenting with an episode of SOB and tachycardia.  Pertinent PMH/PSH includes T2DM, CKDV, PAD h/o BKA, chronic anemia, HTN, HLD.    Pending GI workup and cardiology recommendations patient is nearing discharge.     * Tachycardia HR stable for last 24 hours.  Denies new onset CP or SOB.  VQ scan negative for PE, DC'd heparin. -Cardiac monitoring -Vitals per routine -Cardiology consulted, appreciate recommendations -Continuing diltiazem and Coreg  -Zio patch outpatient  Normocytic normochromic anemia Hgb stable 8.5 s/p 2 units RBCs (3/25).  Will need to rule out acute GI bleed, most likely secondary to chronic disease. Iron studies drawn after transfusion, previous studies WNL.  -Trend CBC -Transfusion threshold <7 -GI consulted, appreciate recommendations -EGD planned for today  Stage 5 chronic kidney disease (HCC) Cr 4.79 > 4.45, baseline Cr 5-6. Pre-dialysis, planning for AVG/AVF placement but not starting HD per patient. Follows with [facility] S/p 1L LR bolus in ED. -Avoid nephrotoxic agents -Hold home Farxiga -F/U nephro outpatient  -Restart Lasix 40 mg -BMP tomorrow morning   Essential hypertension Continues to be hypertensive. Compliant with home meds but reports continually elevated BP at home. -Continue hydralazine 50 mg every 8 hours -Continue Coreg 6.25 mg BID (3/26) -Continue diltiazem 300 mg daily  Uncontrolled type 2 diabetes mellitus with hyperglycemia, with long-term current use of  insulin (HCC) A1c 8.7 in 12/2022. Follows with [facility]. Has Omnipod at home, not using currently. No longer has Dexcom, has not been checking BGL at home. Received 4 units of short acting insulin in 24 hours. -vsSSI -CBG checks  Foot pain, left Chronic s/p ambulation of left great toe 3 weeks ago - scheduled tylenol Q6H  - oxycodone 5 mg once PRN    FEN/GI: NPO, pending GI EGD PPx: Lovenox  Dispo:Home pending clinical improvement . Barriers include ongoing GI procedure.   Subjective:  NAEO, resting comfortably  Objective: Temp:  [97.7 F (36.5 C)-98.5 F (36.9 C)] 98 F (36.7 C) (03/28 0710) Pulse Rate:  [76-82] 80 (03/28 0710) Resp:  [10-22] 15 (03/28 0710) BP: (137-169)/(80-114) 169/96 (03/28 0710) SpO2:  [97 %-98 %] 98 % (03/28 0710) Physical Exam: General: well appearing, no acute distress  Cardiovascular: tachycardic, regular rhythm, no murmurs on exam  Respiratory: clear, no wheezing. No worsening SOB Abdomen: soft, non-tender, non-distended  Extremities: no peripheral edema, s/p right AKA, bandage in place on left foot   Laboratory: Most recent CBC Lab Results  Component Value Date   WBC 7.7 02/18/2023   HGB 8.5 (L) 02/18/2023   HCT 26.0 (L) 02/18/2023   MCV 83.6 02/18/2023   PLT 213 02/18/2023   Most recent BMP    Latest Ref Rng & Units 02/16/2023    2:34 AM  BMP  Glucose 70 - 99 mg/dL 298   BUN 6 - 20 mg/dL 59   Creatinine 0.61 - 1.24 mg/dL 4.45   Sodium 135 - 145 mmol/L 138   Potassium 3.5 - 5.1 mmol/L 3.9   Chloride 98 - 111 mmol/L 107  CO2 22 - 32 mmol/L 20   Calcium 8.9 - 10.3 mg/dL 8.2    Darci Current, DO 02/18/2023, 9:03 AM  PGY-1, [place] Intern pager: [phone], text pages welcome Secure chat group [place]

## 2023-02-19 ENCOUNTER — Ambulatory Visit: Payer: 59 | Attending: Student

## 2023-02-19 DIAGNOSIS — I4892 Unspecified atrial flutter: Secondary | ICD-10-CM

## 2023-02-19 NOTE — Anesthesia Postprocedure Evaluation (Signed)
Anesthesia Post Note  Patient: [name] [name]  Procedure(s) Performed: ESOPHAGOGASTRODUODENOSCOPY (EGD) WITH PROPOFOL BIOPSY     Patient location during evaluation: PACU Anesthesia Type: MAC Level of consciousness: awake and alert Pain management: pain level controlled Vital Signs Assessment: post-procedure vital signs reviewed and stable Respiratory status: spontaneous breathing, nonlabored ventilation and respiratory function stable Cardiovascular status: blood pressure returned to baseline and stable Postop Assessment: no apparent nausea or vomiting Anesthetic complications: no   No notable events documented.  Last Vitals:  Vitals:   02/18/23 1226 02/18/23 1340  BP: 127/84 (!) 143/92  Pulse: 76   Resp: 11 20  Temp:  37 C  SpO2: 97% 98%    Last Pain:  Vitals:   02/18/23 1340  TempSrc: Oral  PainSc:    Pain Goal:                   Lynda Rainwater

## 2023-02-19 NOTE — Progress Notes (Unsigned)
Enrolled patient for a 14 day Zio AT monitor to be mailed to patients home  Dr Curt Bears to read

## 2023-02-22 ENCOUNTER — Telehealth: Payer: Self-pay | Admitting: Cardiovascular Disease

## 2023-02-22 ENCOUNTER — Encounter (HOSPITAL_COMMUNITY): Payer: Self-pay | Admitting: Gastroenterology

## 2023-02-22 ENCOUNTER — Ambulatory Visit (INDEPENDENT_AMBULATORY_CARE_PROVIDER_SITE_OTHER): Payer: 59 | Admitting: Student

## 2023-02-22 VITALS — BP 135/82 | HR 108 | Ht 77.0 in | Wt 231.4 lb

## 2023-02-22 DIAGNOSIS — R000 Tachycardia, unspecified: Secondary | ICD-10-CM | POA: Diagnosis not present

## 2023-02-22 DIAGNOSIS — E1165 Type 2 diabetes mellitus with hyperglycemia: Secondary | ICD-10-CM | POA: Diagnosis not present

## 2023-02-22 DIAGNOSIS — D649 Anemia, unspecified: Secondary | ICD-10-CM

## 2023-02-22 DIAGNOSIS — Z794 Long term (current) use of insulin: Secondary | ICD-10-CM

## 2023-02-22 DIAGNOSIS — I10 Essential (primary) hypertension: Secondary | ICD-10-CM

## 2023-02-22 DIAGNOSIS — N185 Chronic kidney disease, stage 5: Secondary | ICD-10-CM | POA: Diagnosis not present

## 2023-02-22 NOTE — Telephone Encounter (Signed)
Patient was seen in the ED for palpitations. He has a 2 week live monitor ordered but has not received it. Advised it should come in the next few days. Reviewed medications and patient states he is taking as directed.   Advised to call us if he doesn't receive the monitor or if symptoms worsen. Also advised if symptoms worsen or he develops additional symptoms such chest pain, SOB and N/V to go to the ED for evaluation. Patient verbalized understanding and had no questions.

## 2023-02-22 NOTE — Patient Instructions (Addendum)
It was great to see you! Thank you for allowing me to participate in your care!   Our plans for today:  - [place] at [facility] call to set up appointment and to aska bout zio patch- it was mailed on the 29th - June-make sure your GI appointment is scheduled for a colonoscopy - schedule appointment with Dr. Valentina Lucks for your continuous monitor/insulin management - [place] hydralazine 50 mg 3 times daily - Stop taking amlodipine - we will get a CBC to check your blood levels    Take care and seek immediate care sooner if you develop any concerns.  [name], MD

## 2023-02-22 NOTE — Assessment & Plan Note (Signed)
Patient having issues with insulin pump.  Having some elevated glucoses in the 200s. -Endocrine appointment in May -Patient to stop at front desk to set up appointment with Dr. Valentina Lucks for ensuring CGM and insulin pump OK

## 2023-02-22 NOTE — Assessment & Plan Note (Signed)
Tachycardia improved.  Heart rate today 108 but was quite high in the hospital.  Asymptomatic. -Zio patch has been mailed to patient however he has not received it yet, I have encouraged him to reach out to cardiology office to ensure follow-up as well -Continue Coreg

## 2023-02-22 NOTE — Assessment & Plan Note (Signed)
BP 135/82 on recheck.  Patient has only been taking hydralazine twice a day.  Discussed that he should be taking it 3 times daily.  He is also taking his Coreg.  He has stopped his amlodipine after hospitalization. -Hydralazine 50 mg 3 times daily -Continue Coreg

## 2023-02-22 NOTE — Assessment & Plan Note (Signed)
Patient to follow-up with nephrology in 2 weeks.  I discussed that he will need access likely to start dialysis.

## 2023-02-22 NOTE — Telephone Encounter (Signed)
Patient c/o Palpitations:  High priority if patient c/o lightheadedness, shortness of breath, or chest pain  How long have you had palpitations/irregular HR/ Afib? Are you having the symptoms now?  Rapid HR past few months No symptoms currently  Are you currently experiencing lightheadedness, SOB or CP?  No   Do you have a history of afib (atrial fibrillation) or irregular heart rhythm?  Hx irregular heart rhythm   Have you checked your BP or HR? (document readings if available):  138/92  Are you experiencing any other symptoms?  No

## 2023-02-22 NOTE — Assessment & Plan Note (Signed)
>>  ASSESSMENT AND PLAN FOR CHRONIC KIDNEY DISEASE, STAGE 5 (HCC) WRITTEN ON 02/22/2023  5:03 PM BY JAGADISH, MAYURI, MD  Patient to follow-up with nephrology in 2 weeks.  I discussed that he will need access likely to start dialysis.

## 2023-02-22 NOTE — Assessment & Plan Note (Signed)
Patient did receive 2 units of blood in the hospital.  EGD was normal.  He is set up to see GI in June for colonoscopy.  He is overall asymptomatic currently. -CBC for monitoring

## 2023-02-22 NOTE — Progress Notes (Signed)
    SUBJECTIVE:   CHIEF COMPLAINT / HPI: Hospital fu  Admitted from 3/25 to 3/28 for shortness of breath with palpitations VQ scan showed no PE.  Cardiology recommended Zio patch on discharge and transitioned him from amlodipine to Coreg.  His home hydralazine was titrated up as well to 50 mg 3 times daily (he has been taking it BID).  Initially had anemia with hemoglobin to 6.8 and received 2 units packed red blood cells.  GI recommended EGD which was normal and colonoscopy as outpatient.  He says that he since being discharged he has not had any more episodes of palpitations or shortness of breath.  Denies any blood in his stool or really dark-colored stools.  Overall feeling okay  PCP Follow-up Recommendations:  Obtain Dexcom or other means of CBG monitoring-patient says he has an insulin pump but not working correctly-next Endo appointment is in May-blood sugars have been in the 200s. F/u Nephro for AVG/AVF scheduling-he has a nephrology appointment in 2 weeks Outpatient colonoscopy- he says he has an appointment with GI in June Recheck Start outpatient to evaluate hemoglobin-will order today  PERTINENT  PMH / PSH: CKD 5 (has not started HD)  OBJECTIVE:   BP 135/82   Pulse (!) 108   Ht 6\' 5"  (1.956 m)   Wt 231 lb 6.4 oz (105 kg)   SpO2 100%   BMI 27.44 kg/m   General:  NAD, awake, alert, responsive to questions Head: Normocephalic atraumatic CV: Regular rate and rhythm no murmurs rubs or gallops Respiratory: Clear to auscultation, chest rises symmetrically,  no increased work of breathing  ASSESSMENT/PLAN:   Tachycardia Tachycardia improved.  Heart rate today 108 but was quite high in the hospital.  Asymptomatic. -Zio patch has been mailed to patient however he has not received it yet, I have encouraged him to reach out to cardiology office to ensure follow-up as well -Continue Coreg  Normocytic normochromic anemia Patient did receive 2 units of blood in the hospital.  EGD  was normal.  He is set up to see GI in June for colonoscopy.  He is overall asymptomatic currently. -CBC for monitoring  Stage 5 chronic kidney disease ([place]) Patient to follow-up with nephrology in 2 weeks.  I discussed that he will need access likely to start dialysis.  Uncontrolled type 2 diabetes mellitus with hyperglycemia, with long-term current use of insulin ([place]) Patient having issues with insulin pump.  Having some elevated glucoses in the 200s. -Endocrine appointment in May -Patient to stop at front desk to set up appointment with Dr. Valentina Lucks for ensuring CGM and insulin pump OK  Essential hypertension BP 135/82 on recheck.  Patient has only been taking hydralazine twice a day.  Discussed that he should be taking it 3 times daily.  He is also taking his Coreg.  He has stopped his amlodipine after hospitalization. -Hydralazine 50 mg 3 times daily -Continue Coreg    [name], MD [place]

## 2023-02-23 ENCOUNTER — Telehealth: Payer: Self-pay | Admitting: Podiatry

## 2023-02-23 DIAGNOSIS — I4892 Unspecified atrial flutter: Secondary | ICD-10-CM | POA: Diagnosis not present

## 2023-02-23 LAB — CBC
Hematocrit: 30.1 % — ABNORMAL LOW (ref 37.5–51.0)
Hemoglobin: 9.6 g/dL — ABNORMAL LOW (ref 13.0–17.7)
MCH: 27.7 pg (ref 26.6–33.0)
MCHC: 31.9 g/dL (ref 31.5–35.7)
MCV: 87 fL (ref 79–97)
Platelets: 276 10*3/uL (ref 150–450)
RBC: 3.47 x10E6/uL — ABNORMAL LOW (ref 4.14–5.80)
RDW: 15 % (ref 11.6–15.4)
WBC: 10.5 10*3/uL (ref 3.4–10.8)

## 2023-02-23 LAB — SURGICAL PATHOLOGY

## 2023-02-23 NOTE — Telephone Encounter (Signed)
Lmom to call back to set up appt to pick up diabetic shoes

## 2023-02-25 ENCOUNTER — Encounter: Payer: Self-pay | Admitting: Pharmacist

## 2023-02-25 ENCOUNTER — Ambulatory Visit (INDEPENDENT_AMBULATORY_CARE_PROVIDER_SITE_OTHER): Payer: 59 | Admitting: Pharmacist

## 2023-02-25 VITALS — BP 125/76 | HR 98 | Wt 228.6 lb

## 2023-02-25 DIAGNOSIS — E1165 Type 2 diabetes mellitus with hyperglycemia: Secondary | ICD-10-CM | POA: Diagnosis not present

## 2023-02-25 DIAGNOSIS — F17200 Nicotine dependence, unspecified, uncomplicated: Secondary | ICD-10-CM

## 2023-02-25 DIAGNOSIS — Z794 Long term (current) use of insulin: Secondary | ICD-10-CM

## 2023-02-25 MED ORDER — HUMALOG KWIKPEN 100 UNIT/ML ~~LOC~~ SOPN: 6.0000 [IU] | PEN_INJECTOR | Freq: Two times a day (BID) | SUBCUTANEOUS | 2 refills | Status: DC

## 2023-02-25 MED ORDER — TRESIBA FLEXTOUCH 100 UNIT/ML ~~LOC~~ SOPN: 20.0000 [IU] | PEN_INJECTOR | Freq: Every day | SUBCUTANEOUS | 11 refills | Status: DC

## 2023-02-25 NOTE — Progress Notes (Signed)
Reviewed and agree with Dr Koval's plan.   

## 2023-02-25 NOTE — Assessment & Plan Note (Signed)
Diabetes longstanding currently uncontrolled in the setting of patient not using OMNIPOD currenty NOR taking long acting insulin.   Onlytaking Humalog (insulin lispro) Daily PRN. Patient is able to verbalize appropriate hypoglycemia management plan. Control is suboptimal due to medication adherence.  Seen today as a bridge to next endocrinology appointment.  -Restarted basal insulin Tresiba (insulin degludec) 20 units daily in the morning. -Increased rapid insulin Humalog (insulin lispro) from PRN to 6 units BID with meals (lunch and dinner). -Encouraged patient to place sensor when he gets home and START using Dexcom Receiver.   -Patient educated on purpose, proper use, and potential adverse effects of insulin.  -Extensively discussed pathophysiology of diabetes, recommended lifestyle interventions, dietary effects on blood sugar control.  -Counseled on s/sx of and management of hypoglycemia.  -Next A1c anticipated next visit.

## 2023-02-25 NOTE — Assessment & Plan Note (Signed)
Chronic tobacco use disorder - Currently uninterested in any intake reduction or cessation planning. Rated importance of quitting at 0/10.  -Discussed necessity of quitting smoking to avoid additional limb loss.  -Encouraged patient to return at any point if interested in reduction or cessation planning in the future

## 2023-02-25 NOTE — Progress Notes (Signed)
S:    Chief Complaint  Patient presents with   Medication Management    DM2   52 y.o. male who presents for diabetes evaluation, education, and management.  PMH is significant for DM2 with long term current use of insulin, R BKA (2019), CKD5, anemia, tachycardia, and HTN.  Patient was referred and last seen by Primary Care Provider, Dr. Jinny Sanders, on 02/22/23.  At last visit, patient was referred to pharmacy clinic for assistance with CGM and insulin regimen until patient can be seen by endocrinologist in May.  Today, patient arrives in fair spirits and presents with the assistance of a walker and a boot. He arrives with his sister, Manuela Schwartz. He reports having both Dexcom G7 and OmniPod at home but has not applied them. He has been checking with a finger stick. He is only using ~6-8 units of Humalog (insulin lispro) per day. Reports 0 for interest in quitting smoking. Has tried to quit in the past - last time was a couple years ago. Was able to quit for ~3 weeks. Had recent L first toe amputation in February due to osteomyelitis.   Patient reports Diabetes was diagnosed in 2015. Reports he has been on insulin for the past year. Seen by endocrinology for ~1 year (next visit in 1 month)  Current diabetes medications include: Humalog 5-6 units PRN- does not take regularly Current hypertension medications include: carvedilol 6.25 mg BID, hydralazine 50 mg TID, diltiazem 300 mg daily Current hyperlipidemia medications include: atorvastatin 80 mg daily  Patient denies adherence with medications, reports taking Humalog PRN. He will occasionally administer Humalog without checking blood sugar.   Insurance coverage: [employer] dual complete  Patient denies hypoglycemic events.  Reported home fasting blood sugars: mostly 200s  Patient reports nocturia (nighttime urination).  Patient denies neuropathy (nerve pain). Patient reports visual changes. Patient denies self foot exams.   Patient reported  dietary habits: Eats 2 meals/day - rarely eat vegetables  Breakfast: sometimes skips breakfast, on weekends will have breakfast with 4-5 biscuits and/or sausage  Lunch: chopped stake, yams, chips from [employer]: hot dogs and chips  Snacks: pineapple  Drinks: water, unsweetened tea (no soda)   Patient-reported exercise habits: no exercise    O:  Review of Systems  All other systems reviewed and are negative.   Physical Exam Constitutional:      Appearance: Normal appearance.  Pulmonary:     Effort: Pulmonary effort is normal.  Neurological:     Mental Status: He is alert.  Psychiatric:        Mood and Affect: Mood normal.        Behavior: Behavior normal.        Thought Content: Thought content normal.     Lab Results  Component Value Date   HGBA1C 7.2 (H) 09/11/2022   Vitals:   02/25/23 1508  BP: 125/76  Pulse: 98  SpO2: 100%     Clinical Atherosclerotic Cardiovascular Disease (ASCVD): Yes - PAD The ASCVD Risk score (Arnett DK, et al., 2019) failed to calculate for the following reasons:   Cannot find a previous HDL lab   Cannot find a previous total cholesterol lab    A/P: Diabetes longstanding currently uncontrolled in the setting of patient not using OMNIPOD currenty NOR taking long acting insulin.   Onlytaking Humalog (insulin lispro) Daily PRN. Patient is able to verbalize appropriate hypoglycemia management plan. Control is suboptimal due to medication adherence.  Seen today as a bridge to next endocrinology  appointment.  -Restarted basal insulin Tresiba (insulin degludec) 20 units daily in the morning. -Increased rapid insulin Humalog (insulin lispro) from PRN to 6 units BID with meals (lunch and dinner). -Encouraged patient to place sensor when he gets home and START using Dexcom Receiver.   -Patient educated on purpose, proper use, and potential adverse effects of insulin.  -Extensively discussed pathophysiology of diabetes, recommended  lifestyle interventions, dietary effects on blood sugar control.  -Counseled on s/sx of and management of hypoglycemia.  -Next A1c anticipated next visit.   Chronic tobacco use disorder - Currently uninterested in any intake reduction or cessation planning. Rated importance of quitting at 0/10.  -Discussed necessity of quitting smoking to avoid additional limb loss.  -Encouraged patient to return at any point if interested in reduction or cessation planning in the future  Written patient instructions provided. Patient verbalized understanding of treatment plan.  Total time in face to face counseling 45 minutes.    Follow-up:  Pharmacist 03/11/2023. Patient seen with Adrian Blackwater PGY-1 Pharmacy Resident, Estelle June, PharmD Candidate and Joseph Art, PharmD, PGY2 Pharmacy Resident.

## 2023-02-25 NOTE — Patient Instructions (Addendum)
It was nice to see you today!  Your goal blood sugar is 80-130 before eating and less than 180 after eating.  Medication Changes: Tresiba 20 units every morning   Humalog 6 units twice daily with meals (lunch and dinner)    Monitor blood sugars at home and keep a log (glucometer or piece of paper) to bring with you to your next visit.  Aim for a diet full of vegetables, fruit and lean meats (chicken, [place], fish). Try to limit salt intake by eating fresh or frozen vegetables (instead of canned), rinse canned vegetables prior to cooking and do not add any additional salt to meals.

## 2023-03-01 ENCOUNTER — Telehealth: Payer: Self-pay

## 2023-03-01 ENCOUNTER — Ambulatory Visit (INDEPENDENT_AMBULATORY_CARE_PROVIDER_SITE_OTHER): Payer: 59 | Admitting: Student

## 2023-03-01 VITALS — BP 138/81 | HR 99 | Ht 77.0 in | Wt 234.2 lb

## 2023-03-01 DIAGNOSIS — R112 Nausea with vomiting, unspecified: Secondary | ICD-10-CM | POA: Diagnosis not present

## 2023-03-01 MED ORDER — GABAPENTIN 300 MG PO CAPS: 300.0000 mg | ORAL_CAPSULE | Freq: Every day | ORAL | 0 refills | Status: DC

## 2023-03-01 MED ORDER — ONDANSETRON 8 MG PO TBDP: 8.0000 mg | ORAL_TABLET | Freq: Three times a day (TID) | ORAL | 0 refills | Status: DC | PRN

## 2023-03-01 NOTE — Patient Instructions (Signed)
It was great to see you! Thank you for allowing me to participate in your care!   Our plans for today:  -I am increasing Zofran to 8 mg -I want you to keep your follow-up with the kidney doctor -We will get some labs on you today as well  Take care and seek immediate care sooner if you develop any concerns.  [name], MD

## 2023-03-01 NOTE — Telephone Encounter (Signed)
Called patient. Reports long standing issues with nausea for over one year.   Reports last episode of vomiting was 3.5 weeks ago.   Blood sugars have been running around low 200's.   Advised of need for further evaluation. Patient reports that he was just seen last week. Informed that we would likely need to have additional labs and evaluation that was not performed at last visit.   Patient agreed to scheduling appointment for this afternoon.   Spoke with Dr. Jennette Kettle who agreed that patient could be seen in clinic this afternoon.   Veronda Prude, RN

## 2023-03-01 NOTE — Telephone Encounter (Signed)
Patient calls nurse line regarding medications. He states that he needs a refill on nausea medication. He states that the 4 mg is not working and would like for this to be increased to 8 mg. Advised that he may need appointment to discuss increase. Patient had appointment on 4/1. Please advise if additional appointment is needed.   He also needs refill on gabapentin 300 mg. He reports that he only takes this daily.   Please send refills to [facility] on [address].   Veronda Prude, RN

## 2023-03-01 NOTE — Progress Notes (Signed)
    SUBJECTIVE:   CHIEF COMPLAINT / HPI: Nausea  1 year this has been going on Comes and goes-not sure what triggers it Sugars around 200s, has insulin pump A little bit more confused for a long time Seeing [facility] tomorrow Last emesis episode was 2 weeks ago  Last BM was yesterday-runny Urinating normally No polydipsia No fevers-had a 99 a couple weeks ago Eating well 4 mg zofran not working at [employer] like to try 8 mg Sometimes gets metallic taste in mouth Sometimes feels more confused than usual for the past 1 year Denies any swelling  PERTINENT  PMH / PSH: CKD 5, uncontrolled type 2 diabetes  OBJECTIVE:   BP 138/81   Pulse 99   Ht 6\' 5"  (1.956 m)   Wt 234 lb 3.2 oz (106.2 kg)   SpO2 100%   BMI 27.77 kg/m   General: NAD, awake, alert, responsive to questions Head: Normocephalic atraumatic CV: Regular rate and rhythm no murmurs rubs or gallops Respiratory: Clear to ausculation bilaterally, no wheezes rales or crackles, chest rises symmetrically,  no increased work of breathing Abdomen: Soft, non-tender, non-distended, normoactive bowel sounds  Extremities: Moves upper and lower extremities freely, no edema in LE Neuro: No focal deficits Skin: No rashes or lesions visualized   ASSESSMENT/PLAN:   Nausea Unlikely to be infectious process.  Possible that this is related to uremia although patient has been having the symptoms for a year.  No urgent need for dialysis as patient is mentating well.  He has close follow-up with [facility] tomorrow.  Will check RFP today.  Discussed precautions with patient as well to go to the ED. DKA possible but less likely as patient has sugars in the 200s but denies any vomiting or polydipsia.  No acute abdomen and has been having regular bowel movements, unlikely to be obstructive cause of nausea. - Renal Function Panel - ondansetron (ZOFRAN-ODT) 8 MG disintegrating tablet; Take 1 tablet (8 mg total) by mouth every 8  (eight) hours as needed for nausea or vomiting.  Dispense: 20 tablet; Refill: 0   [name], MD [facility] [place]

## 2023-03-02 LAB — RENAL FUNCTION PANEL
Albumin: 3.7 g/dL — ABNORMAL LOW (ref 4.1–5.1)
BUN/Creatinine Ratio: 12 (ref 9–20)
BUN: 56 mg/dL — ABNORMAL HIGH (ref 6–24)
CO2: 18 mmol/L — ABNORMAL LOW (ref 20–29)
Calcium: 8.2 mg/dL — ABNORMAL LOW (ref 8.7–10.2)
Chloride: 106 mmol/L (ref 96–106)
Creatinine, Ser: 4.73 mg/dL — ABNORMAL HIGH (ref 0.76–1.27)
Glucose: 362 mg/dL — ABNORMAL HIGH (ref 70–99)
Phosphorus: 6.3 mg/dL — ABNORMAL HIGH (ref 2.8–4.1)
Potassium: 5 mmol/L (ref 3.5–5.2)
Sodium: 143 mmol/L (ref 134–144)
eGFR: 14 mL/min/{1.73_m2} — ABNORMAL LOW (ref >59)

## 2023-03-02 NOTE — Telephone Encounter (Signed)
Lmom to call back to set up appt to pick up diabetic shoes  

## 2023-03-03 ENCOUNTER — Telehealth: Payer: Self-pay | Admitting: Student

## 2023-03-03 NOTE — Telephone Encounter (Signed)
Called [place] x 4 and mom x  2 and no answers. I left detailed VM on [name] [name]'s phone and also let him know to check mychart. Glucose is quite elevated-discussed if having vomiting, worsening nausea, increased urination or thirst alongside high glucose I recommend going to the emergency department for evaluation of DKA given high anion gap (although renal dysfunction can cause this as well and has been elevated on past labs). Renal dysfunction and BUN similar to past values. Discussed continued follow up with kidney doctors. Sent detailed mychart message as well.

## 2023-03-11 ENCOUNTER — Ambulatory Visit: Payer: 59 | Admitting: Pharmacist

## 2023-03-11 ENCOUNTER — Encounter (HOSPITAL_COMMUNITY): Payer: Self-pay

## 2023-03-11 ENCOUNTER — Inpatient Hospital Stay (HOSPITAL_COMMUNITY)
Admission: RE | Admit: 2023-03-11 | Discharge: 2023-03-11 | Disposition: A | Discharge status: Home / Self Care | Payer: 59 | Source: Ambulatory Visit | Attending: Nephrology | Admitting: Nephrology

## 2023-03-15 NOTE — Telephone Encounter (Signed)
Left message on vm and sent my chart message about setting appointment to pick up diabetic shoes.

## 2023-03-25 ENCOUNTER — Encounter (HOSPITAL_COMMUNITY): Payer: 59

## 2023-03-25 NOTE — Telephone Encounter (Signed)
Patient called back he no longer wants to get the diabetic shoes and insoles.  Will cancel order and send shoes back today

## 2023-03-25 NOTE — Telephone Encounter (Signed)
Left message with patients mother , he will call back when he is out of the shower

## 2023-03-30 ENCOUNTER — Other Ambulatory Visit: Payer: Self-pay | Admitting: Student

## 2023-04-02 ENCOUNTER — Ambulatory Visit: Payer: 59 | Attending: Physician Assistant | Admitting: Physician Assistant

## 2023-04-02 ENCOUNTER — Encounter: Payer: Self-pay | Admitting: Physician Assistant

## 2023-04-02 VITALS — BP 132/54 | HR 101 | Ht 77.0 in | Wt 234.0 lb

## 2023-04-02 DIAGNOSIS — I471 Supraventricular tachycardia, unspecified: Secondary | ICD-10-CM | POA: Diagnosis not present

## 2023-04-02 DIAGNOSIS — I10 Essential (primary) hypertension: Secondary | ICD-10-CM | POA: Diagnosis not present

## 2023-04-02 MED ORDER — METOPROLOL SUCCINATE ER 25 MG PO TB24: 25.0000 mg | ORAL_TABLET | Freq: Every day | ORAL | 3 refills | Status: DC

## 2023-04-02 NOTE — Progress Notes (Signed)
Cardiology Office Note Date:  10/07/2022  Patient ID:  [name], [name] 01-04-1971, MRN [id number] PCP:  Lance Bosch, NP  Cardiologist:  Dr. Clifton James Electrophysiologist: Dr. Elberta Fortis Nephrology: [facility], Dr. Malen Gauze Endocrinology: Benita Stabile, FNP     Chief Complaint:   post hospital  History of Present Illness: [name] [name] is a 52 y.o. 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  He was referred to Dr. Clifton James April 2023 for palpitations, planned for monitoring and an echo  Monitor noted an SVT and recommended increasing his metoprolol and EP referral for possible ablation Turns out his PMD had already increased his metoprolol and no further changes made.  Echo noted preserved LVEF no ,  significant   He saw Dr. Elberta Fortis 05/19/22, aware of on/off fast rates, otherwise no particular symptoms reported.  He wanted to try and avoid procedures, started on diltiazem, stopped his metoprolol.  ER visit 06/25/22 for a cyst/swelling L chest wall, (reportedly drained some years prior). Creat worse, discussed with his nepgrologist, Dr. Malen Gauze who has been seeing him regularly and likely pending need for HD, though if lytes were ok, no need to admit. CT chest noted mass/lesion, (not new noted on prior CTs) and radiology recommended MRI to better characterize. With some erythema given a course of antibiotics and discharged with close f/u with renal especially and PMD to discuss f/u on mass/MRI recs.  I saw him 06/30/22 His palpitations are less often/shorter but not gone. Mostly noted in the evenings in bed. They can last a couple minutes, he is aware of the fast rate but not particularly symptomatic otherwise. No CP No near syncope or syncope. No SOB While here his Dexcom alerts to BS of 67 and declining but feeling OK. He was given a cola and graham crackers with an  upwards trend after a couple minutes, still feeling well He follows closely with endo, has a insulin pump, infrequently with low readings His diltiazem was changed to PM dosing given palpitations seemed most often to bother him in the evenings.  Diltiazem was ultimately increased to 300mg  with ongoing palpitations  Admitted 09/10/22 for acute hypoxic respiratory failure, found with Rhinovirus Given Abx, steroid taper Discharged 09/12/22  LABS 09/12/22 K+ 4.0 BUN/Creat 77/5.52 (baseline looks 3-4's > 5) WBC 10.2 H/H 8.4/27  Baseline Hgb looks about  9-10) Plts 192  I saw him 09/2022 He is doing better. Initially denies any ongoing palpitations, though towards the end of our visit, again mentions palpitations/fast rates when in bed at night, lasting several minutes with a sense of racing beats.   No other associated symptoms No CP, denies SOB No dizzy spells, near syncope or syncope. He reports some edema since his hospital stay, doesn't think the Flomax works well for him.  C/w + urine OP. He reports that he takes lasix BID (not daily), has been for several months at the direction of Dr. Malen Gauze He follows with Dr. Malen Gauze, see her in December, there have been discussions he says about dialysis, though not a final decision/recommendation yet. Adjusted timing of his dilt with some continued symptoms/SVts   01/14/2023 at [facility] for L foot osteomyelitis and underwent a L  partial ray amputation on 01/16/2023 by podiatry then discharged on 3-week regimen of ciprofloxacin and Zyvox.  Discharged 01/20/23  02/12/2023 he presented to the [facility] ED again due to a near syncopal event; however after evaluation  it was noted this was likely due to to severe dehydration as the patient was not drinking water and has known end-stage renal disease not on dialysis.  He was given IV fluids with improvement and discharged.     Admitted 02/15/23 w/ complaints of SOB, palpitations, reported increasing  frequency since his BKA of his tachycardia, he was found to be severely anemic with a hemoglobin of 6.8 and transfused with 1 unit of PRBCs and subsequently 2 more units during the night, getting him to  8.4  VQ scan negative for PE, was  hypertensive with blood pressures ranging from 160-190 systolic with history of uncontrolled BP  EKG felt to represent AFlutter, had spontaneous CV to SR Mild HS trop elevation felt to be demand. 02/18/23, maintaining SR, in d/w Dr. Elberta Fortis, planned for out patent monitoring and decisions about a/c or not pending GI findings and w/u. Cards s/o noted no cause for his anemia on EGD  Discharged 02/18/27 Colonoscopy determined to be low yield given challenging prep. Patient underwent EGD on 3/29 which showed normal stomach duodenum. Ruled out GI etiology for anemia. Most likely this is secondary to chronic disease. GI to follow-up outpatient.   Out pt Monitoring: Predominant rhythm was sinus rhythm 752 SVT episodes, longest and fastest 19.2 seconds average 144 bpm. Less than 1% ventricular and supraventricular ectopy Triggered episodes associated with sinus tachycardia   TODAY He feels palpitations, no associated dizziness, but bother him No CP No SOB No near syncope or syncope.  He saw Dr. Malen Gauze yesterday He is on Farxiga confirmed with her yesterday , had labs yesterday as well He never started coreg, was unaware of the recommendation,never picked any rx up for it. He also was unaware of the increase of his hydralazine to TID and has continued BID  Home BPs anywhere from 130's-170's/70's or so     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)    Depression    Diabetes mellitus without complication (HCC) 2019   GERD (gastroesophageal reflux disease)    Hyperlipidemia     Hypertension    Necrotizing fasciitis (HCC) 10/13/2017   Neuromuscular disorder (HCC)    neuropathy   PAD (peripheral artery disease) (HCC)    Secondary hyperparathyroidism (HCC)    Wound dehiscence 11/24/2017    Past Surgical History:  Procedure Laterality Date   AMPUTATION Right 10/13/2017   Procedure: RIGHT BELOW KNEE AMPUTATION;  Surgeon: Nadara Mustard, MD;  Location: [facility] OR;  Service: Orthopedics;  Laterality: Right;   AMPUTATION Right 11/24/2017   Procedure: AMPUTATION BELOW KNEE REVISION;  Surgeon: Nadara Mustard, MD;  Location: [facility] OR;  Service: Orthopedics;  Laterality: Right;   BIOPSY  09/02/2021   Procedure: BIOPSY;  Surgeon: Imogene Burn, MD;  Location: [facility] ENDOSCOPY;  Service: Gastroenterology;;   COLONOSCOPY  05/11/2019   ESOPHAGOGASTRODUODENOSCOPY (EGD) WITH PROPOFOL N/A 09/02/2021   Procedure: ESOPHAGOGASTRODUODENOSCOPY (EGD) WITH PROPOFOL;  Surgeon: Imogene Burn, MD;  Location: [facility] ENDOSCOPY;  Service: Gastroenterology;  Laterality: N/A;   NO PAST SURGERIES     POLYPECTOMY     WISDOM TOOTH EXTRACTION      Current Outpatient Medications  Medication Sig Dispense Refill   AGAMATRIX ULTRA-THIN LANCETS MISC Check blood sugars before meals twice daily 100 each 11  ARIPiprazole (ABILIFY) 20 MG tablet Take 20 mg by mouth daily.     atorvastatin (LIPITOR) 40 MG tablet Take 40 mg by mouth at bedtime.     baclofen (LIORESAL) 10 MG tablet 1/2 tab twice a day (Patient taking differently: Take 10 mg by mouth in the morning and at bedtime. Muscle spasms) 30 each 6   calcitRIOL (ROCALTROL) 0.5 MCG capsule Take 0.5 mcg by mouth daily.     diltiazem (CARDIZEM CD) 300 MG 24 hr capsule Take 1 capsule (300 mg total) by mouth at bedtime. 30 capsule 2   FARXIGA 10 MG TABS tablet Take 10 mg by mouth daily.     furosemide (LASIX) 40 MG tablet Take 40 mg by mouth daily.     gabapentin (NEURONTIN) 300 MG capsule Take 300 mg by mouth 2 (two) times daily. Pt takes 300 mg 1 tablet in the  morning, and 300 mg 1 tablet in the afternoon.     glucose blood (AGAMATRIX PRESTO TEST) test strip Check blood sugars twice daily before meals 100 each 12   glucose blood test strip 1 each by Other route as needed for other. Use as instructed     hydrALAZINE (APRESOLINE) 50 MG tablet Take 50 mg by mouth in the morning and at bedtime.     Insulin Disposable Pump (OMNIPOD DASH PODS, GEN 4,) MISC SMARTSIG:SUB-Q Every 3 Days     insulin lispro (HUMALOG) 100 UNIT/ML injection Use as directed via insulin pump. MAX TDD 30 units     ondansetron (ZOFRAN) 4 MG tablet Take 1 tablet (4 mg total) by mouth 3 (three) times daily as needed for nausea or vomiting. 30 tablet 0   pantoprazole (PROTONIX) 40 MG tablet Take 1 tablet (40 mg total) by mouth 2 (two) times daily. 60 tablet 2   tamsulosin (FLOMAX) 0.4 MG CAPS capsule Take 0.4 mg by mouth as needed (misc. genitourinary products).     No current facility-administered medications for this visit.    Allergies:   Iron and Tape   Social History:  The patient  reports that he has been smoking cigarettes. He has a 17.50 pack-year smoking history. He has never used smokeless tobacco. He reports that he does not drink alcohol and does not use drugs.   Family History:  The patient's family history includes Colon polyps in his mother; Depression in his mother; Diabetes Mellitus II in his mother; Heart disease in his father.  ROS:  Please see the history of present illness.    All other systems are reviewed and otherwise negative.   PHYSICAL EXAM:  VS:  BP 138/86   Pulse 100   Ht 6\' 5"  (1.956 m)   Wt 248 lb 12.8 oz (112.9 kg)   SpO2 98%   BMI 29.50 kg/m  BMI: Body mass index is 29.5 kg/m. Well nourished, well developed, in no acute distress HEENT: normocephalic, atraumatic Neck: no JVD, carotid bruits or masses Cardiac:   RRR; no significant murmurs, no rubs, or gallops Lungs:   CTA b/l, no wheezing, rhonchi or rales Abd: soft, nontender MS: RLE  prosthesis Ext: trace- 1+ edema LLE to below mid shin, chronic/scaling looking skin changes, shoe not removed Skin: warm and dry, no rash Neuro:  No gross deficits appreciated Psych: euthymic mood, full affect    EKG:  done today and reviewed by myself ST 102bpm    April 2024 monitor Predominant rhythm was sinus rhythm 752 SVT episodes, longest and fastest 19.2 seconds average 144 bpm.  Less than 1% ventricular and supraventricular ectopy Triggered episodes associated with sinus tachycardia   May 2023: monitor Sinus rhythm. Minimum heart rate of 59 bpm. Maximum heart rate of 171 bpm.  1 run of Ventricular Tachycardia occurred lasting 5 beats  47 Supraventricular Tachycardia  runs occurred, the longest lasting 2 mins 8 seconds. Supraventricular Tachycardia was detected within +/- 45 seconds of symptomatic patient event(s). Rare Premature atrial contractions (<1%) Rare premature ventricular contractions. (<1.0%, 1357)  03/31/2022: TTE 1. Left ventricular ejection fraction, by estimation, is 60 to 65%. The  left ventricle has normal function. The left ventricle has no regional  wall motion abnormalities. There is mild left ventricular hypertrophy.  Left ventricular diastolic parameters  were normal.   2. Right ventricular systolic function is normal. The right ventricular  size is normal. Tricuspid regurgitation signal is inadequate for assessing  PA pressure.   3. The mitral valve is normal in structure. No evidence of mitral valve  regurgitation. No evidence of mitral stenosis.   4. The aortic valve has an indeterminant number of cusps. Aortic valve  regurgitation is not visualized. No aortic stenosis is present.   5. The inferior vena cava is normal in size with greater than 50%  respiratory variability, suggesting right atrial pressure of 3 mmHg.   Comparison(s): No prior Echocardiogram.    Recent Labs: 09/10/2022: B Natriuretic Peptide 33.0 09/12/2022: BUN 77;  Creatinine, Ser 5.52; Platelets 192; Potassium 4.9; Sodium 143 10/01/2022: Hemoglobin 8.4  No results found for requested labs within last 365 days.   CrCl cannot be calculated (Patient's most recent lab result is older than the maximum 21 days allowed.).   Wt Readings from Last 3 Encounters:  10/07/22 248 lb 12.8 oz (112.9 kg)  09/10/22 247 lb (112 kg)  06/30/22 240 lb (108.9 kg)     Other studies reviewed: Additional studies/records reviewed today include: summarized above  ASSESSMENT AND PLAN:  SVT Reviewed hospital EKG with Dr. Elberta Fortis, not clear/obvious for AFlutter. With no AFlutter on his monitor recent anemia requiring PRBC, no clearly documented AFlytter, no a/c at this time  2. HTN Recheck by myself was 128/68  Since he never started coreg Will start Toprol 25mg  daily Request labs from Dr. Mercy Riding office Have him back in a couple weeks to titrate Toprol as able   Disposition: as above   Current medicines are reviewed at length with the patient today.  The patient did not have any concerns regarding medicines.  Norma Fredrickson, PA-C 10/07/2022 12:41 PM     [facility] [address] [place] [address] [phone] (office)  [phone] (fax)

## 2023-04-02 NOTE — Patient Instructions (Signed)
Medication Instructions:   START TAKING:  TOPROL XL  25 MG ONCE A DAY   *If you need a refill on your cardiac medications before your next appointment, please call your pharmacy*   Lab Work:  NONE ORDERED  TODAY   If you have labs (blood work) drawn today and your tests are completely normal, you will receive your results only by: MyChart Message (if you have MyChart) OR A paper copy in the mail If you have any lab test that is abnormal or we need to change your treatment, we will call you to review the results.   Testing/Procedures: NONE ORDERED  TODAY     Follow-Up: At [facility], you and your health needs are our priority.  As part of our continuing mission to provide you with exceptional heart care, we have created designated Provider Care Teams.  These Care Teams include your primary Cardiologist (physician) and Advanced Practice Providers (APPs -  Physician Assistants and Nurse Practitioners) who all work together to provide you with the care you need, when you need it.  We recommend signing up for the patient portal called "MyChart".  Sign up information is provided on this After Visit Summary.  MyChart is used to connect with patients for Virtual Visits (Telemedicine).  Patients are able to view lab/test results, encounter notes, upcoming appointments, etc.  Non-urgent messages can be sent to your provider as well.   To learn more about what you can do with MyChart, go to [email or web address].    Your next appointment:   2 week(s)  Provider:   Francis Dowse, PA-C    Other Instructions

## 2023-04-08 ENCOUNTER — Encounter (HOSPITAL_COMMUNITY)
Admission: RE | Admit: 2023-04-08 | Discharge: 2023-04-08 | Disposition: A | Discharge status: Home / Self Care | Payer: 59 | Source: Ambulatory Visit | Attending: Nephrology | Admitting: Nephrology

## 2023-04-08 VITALS — BP 148/85 | HR 80 | Temp 97.3°F | Resp 18

## 2023-04-08 DIAGNOSIS — D649 Anemia, unspecified: Secondary | ICD-10-CM | POA: Insufficient documentation

## 2023-04-08 DIAGNOSIS — N179 Acute kidney failure, unspecified: Secondary | ICD-10-CM | POA: Diagnosis present

## 2023-04-08 DIAGNOSIS — D62 Acute posthemorrhagic anemia: Secondary | ICD-10-CM

## 2023-04-08 LAB — IRON AND TIBC
Iron: 60 ug/dL (ref 45–182)
Saturation Ratios: 19 % (ref 17.9–39.5)
TIBC: 311 ug/dL (ref 250–450)
UIBC: 251 ug/dL

## 2023-04-08 LAB — FERRITIN: Ferritin: 41 ng/mL (ref 24–336)

## 2023-04-08 LAB — POCT HEMOGLOBIN-HEMACUE: Hemoglobin: 9.6 g/dL — ABNORMAL LOW (ref 13.0–17.0)

## 2023-04-08 MED ORDER — EPOETIN ALFA-EPBX 10000 UNIT/ML IJ SOLN
INTRAMUSCULAR | Status: AC
  Filled 2023-04-08: qty 2

## 2023-04-08 MED ORDER — EPOETIN ALFA-EPBX 10000 UNIT/ML IJ SOLN
20000.0000 [IU] | INTRAMUSCULAR | Status: DC
  Administered 2023-04-08: 20000 [IU] via SUBCUTANEOUS

## 2023-04-11 NOTE — Progress Notes (Signed)
Cardiology Office Note Date:  04/11/2023  Patient ID:  [name], [name] [birth year], MRN [id number] PCP:  [name], MD  Cardiologist:  Dr. Clifton James Electrophysiologist: Dr. Elberta Fortis Nephrology: [facility], Dr. Malen Gauze Endocrinology: Benita Stabile, FNP     Chief Complaint:    planned f/u  History of Present Illness: [name] [name] is a 52 y.o. 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  He was referred to Dr. Clifton James April 2023 for palpitations, planned for monitoring and an echo  Monitor noted an SVT and recommended increasing his metoprolol and EP referral for possible ablation Turns out his PMD had already increased his metoprolol and no further changes made.  He saw Dr. Elberta Fortis 05/19/22, aware of on/off fast rates, otherwise no particular symptoms reported.  He wanted to try and avoid procedures, started on diltiazem, stopped his metoprolol.  ER visit 06/25/22 for a cyst/swelling L chest wall, (reportedly drained some years prior). Creat worse, discussed with his nepgrologist, Dr. Malen Gauze who has been seeing him regularly and likely pending need for HD, though if lytes were ok, no need to admit. CT chest noted mass/lesion, (not new noted on prior CTs) and radiology recommended MRI to better characterize. With some erythema given a course of antibiotics and discharged with close f/u with renal especially and PMD to discuss f/u on mass/MRI recs.  I saw him 06/30/22 His palpitations are less often/shorter but not gone. Mostly noted in the evenings in bed. They can last a couple minutes, he is aware of the fast rate but not particularly symptomatic otherwise. No CP No near syncope or syncope. No SOB While here his Dexcom alerts to BS of 67 and declining but feeling OK. He was given a cola and graham crackers with an upwards trend after a couple minutes, still  feeling well He follows closely with endo, has a insulin pump, infrequently with low readings His diltiazem was changed to PM dosing given palpitations seemed most often to bother him in the evenings.  Diltiazem was ultimately increased to 300mg  with ongoing palpitations  Admitted 09/10/22 for acute hypoxic respiratory failure, found with Rhinovirus Given Abx, steroid taper Discharged 09/12/22  LABS 09/12/22 K+ 4.0 BUN/Creat 77/5.52 (baseline looks 3-4's > 5) WBC 10.2 H/H 8.4/27  Baseline Hgb looks about  9-10) Plts 192  I saw him 09/2022 He is doing better. Initially denies any ongoing palpitations, though towards the end of our visit, again mentions palpitations/fast rates when in bed at night, lasting several minutes with a sense of racing beats.   No other associated symptoms No CP, denies SOB No dizzy spells, near syncope or syncope. He reports some edema since his hospital stay, doesn't think the Flomax works well for him.  C/w + urine OP. He reports that he takes lasix BID (not daily), has been for several months at the direction of Dr. Malen Gauze He follows with Dr. Malen Gauze, see her in December, there have been discussions he says about dialysis, though not a final decision/recommendation yet. Adjusted timing of his dilt with some continued symptoms/SVts   01/14/2023 at [facility] for L foot osteomyelitis and underwent a L  partial ray amputation on 01/16/2023 by podiatry then discharged on 3-week regimen of ciprofloxacin and Zyvox.  Discharged 01/20/23  02/12/2023 he presented to the [facility] ED again due to a near syncopal event; however after evaluation it was noted this was likely due to  to severe dehydration as the patient was not drinking water and has known end-stage renal disease not on dialysis.  He was given IV fluids with improvement and discharged.     Admitted 02/15/23 w/ complaints of SOB, palpitations, reported increasing frequency since his BKA of his tachycardia, he  was found to be severely anemic with a hemoglobin of 6.8 and transfused with 1 unit of PRBCs and subsequently 2 more units during the night, getting him to  8.4  VQ scan negative for PE, was  hypertensive with blood pressures ranging from 160-190 systolic with history of uncontrolled BP  EKG felt to represent AFlutter, had spontaneous CV to SR Mild HS trop elevation felt to be demand. 02/18/23, maintaining SR, in d/w Dr. Elberta Fortis, planned for out patent monitoring and decisions about a/c or not pending GI findings and w/u. Cards s/o noted no cause for his anemia on EGD  Discharged 02/18/27 Colonoscopy determined to be low yield given challenging prep. Patient underwent EGD on 3/29 which showed normal stomach duodenum. Ruled out GI etiology for anemia. Most likely this is secondary to chronic disease. GI to follow-up outpatient.   Out pt Monitoring: Predominant rhythm was sinus rhythm 752 SVT episodes, longest and fastest 19.2 seconds average 144 bpm. Less than 1% ventricular and supraventricular ectopy Triggered episodes associated with sinus tachycardia   I saw him 04/02/23 He feels palpitations, no associated dizziness, but bother him No CP No SOB No near syncope or syncope. He saw Dr. Malen Gauze yesterday He is on Farxiga confirmed with her yesterday , had labs yesterday as well He never started coreg, was unaware of the recommendation,never picked any rx up for it. He also was unaware of the increase of his hydralazine to TID and has continued BID Home BPs anywhere from 130's-170's/70's or so, better by my check He had never started Coerg so started on Toprol with plans to titrate as able Reviewed with Dr. Elberta Fortis, since not clearly AFlutter and GIB, not OAC yet Requested labs from Dr. Nena Polio in [facility] 03/26/23: BUN/Creat 58/5.89 K+ 4.5 Mag 2 Hgb 9.6  5/16 Hgb 9.6  TODAY He has not noticed any improvement in his palpitations with the Toprol. Not any worse either, they can  last 3-5 minutes, no clear trigger or opattern No CP No near syncope or syncope.  Mentions today walking in felt unusually SOB, fatigued today No bleeding or signs of bleeding BS by his dexcom is 155 EKG is SR 85bpm, BP recheck in 130/76   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)    Depression    Diabetes mellitus without complication (HCC) 2019   GERD (gastroesophageal reflux disease)    Hyperlipidemia    Hypertension    Necrotizing fasciitis (HCC) 10/13/2017   Neuromuscular disorder (HCC)    neuropathy   PAD (peripheral artery disease) (HCC)    Secondary hyperparathyroidism (HCC)    Wound dehiscence 11/24/2017    Past Surgical History:  Procedure Laterality Date   AMPUTATION Right 10/13/2017   Procedure: RIGHT BELOW KNEE AMPUTATION;  Surgeon: Nadara Mustard, MD;  Location[facility] OR;  Service: Orthopedics;  Laterality: Right;   AMPUTATION Right 11/24/2017   Procedure: AMPUTATION BELOW KNEE REVISION;  Surgeon: Nadara Mustard, MD;  Location: [facility] OR;  Service:  Orthopedics;  Laterality: Right;   BIOPSY  09/02/2021   Procedure: BIOPSY;  Surgeon: Imogene Burn, MD;  Location: [facility] ENDOSCOPY;  Service: Gastroenterology;;   BIOPSY  02/18/2023   Procedure: BIOPSY;  Surgeon: Benancio Deeds, MD;  Location: [facility] ENDOSCOPY;  Service: Gastroenterology;;   COLONOSCOPY  05/11/2019   ESOPHAGOGASTRODUODENOSCOPY (EGD) WITH PROPOFOL N/A 09/02/2021   Procedure: ESOPHAGOGASTRODUODENOSCOPY (EGD) WITH PROPOFOL;  Surgeon: Imogene Burn, MD;  Location: [facility] ENDOSCOPY;  Service: Gastroenterology;  Laterality: N/A;   ESOPHAGOGASTRODUODENOSCOPY (EGD) WITH PROPOFOL N/A 02/18/2023   Procedure: ESOPHAGOGASTRODUODENOSCOPY (EGD) WITH PROPOFOL;  Surgeon: Benancio Deeds, MD;  Location: [facility] ENDOSCOPY;  Service:  Gastroenterology;  Laterality: N/A;   NO PAST SURGERIES     POLYPECTOMY     WISDOM TOOTH EXTRACTION      Current Outpatient Medications  Medication Sig Dispense Refill   acetaminophen (TYLENOL) 325 MG tablet Take 2 tablets (650 mg total) by mouth every 6 (six) hours. (Patient not taking: Reported on 04/02/2023)     AGAMATRIX ULTRA-THIN LANCETS MISC Check blood sugars before meals twice daily 100 each 11   atorvastatin (LIPITOR) 40 MG tablet Take 1 tablet (40 mg total) by mouth at bedtime. 90 tablet 0   baclofen (LIORESAL) 10 MG tablet 1/2 tab twice a day (Patient taking differently: Take 10 mg by mouth daily. 1/2 tab twice a day) 30 each 0   Blood Glucose Monitoring Suppl (ACCU-CHEK GUIDE ME) w/Device KIT See admin instructions.     calcitRIOL (ROCALTROL) 0.5 MCG capsule Take 0.5 mcg by mouth in the morning and at bedtime.     diltiazem (CARDIZEM CD) 300 MG 24 hr capsule TAKE 1 CAPSULE(300 MG) BY MOUTH AT BEDTIME 90 capsule 3   FARXIGA 10 MG TABS tablet Take 10 mg by mouth daily.     furosemide (LASIX) 40 MG tablet Take 40 mg by mouth daily.     gabapentin (NEURONTIN) 300 MG capsule TAKE 1 CAPSULE(300 MG) BY MOUTH DAILY 30 capsule 0   glucose blood (AGAMATRIX PRESTO TEST) test strip Check blood sugars twice daily before meals 100 each 12   glucose blood test strip 1 each by Other route as needed for other. Use as instructed     HUMALOG KWIKPEN 100 UNIT/ML KwikPen Inject 6 Units into the skin 2 (two) times daily before lunch and supper. Dose per sliding scale 15 mL 2   hydrALAZINE (APRESOLINE) 50 MG tablet Take 1 tablet (50 mg total) by mouth every 8 (eight) hours. (Patient taking differently: Take 50 mg by mouth in the morning and at bedtime.) 90 tablet 0   insulin degludec (TRESIBA FLEXTOUCH) 100 UNIT/ML FlexTouch Pen Inject 20 Units into the skin daily. (Patient not taking: Reported on 04/02/2023) 3 mL 11   Insulin Disposable Pump (OMNIPOD DASH PODS, GEN 4,) MISC      metoprolol succinate  (TOPROL XL) 25 MG 24 hr tablet Take 1 tablet (25 mg total) by mouth daily. 90 tablet 3   ondansetron (ZOFRAN-ODT) 8 MG disintegrating tablet Take 1 tablet (8 mg total) by mouth every 8 (eight) hours as needed for nausea or vomiting. 20 tablet 0   pantoprazole (PROTONIX) 40 MG tablet Take 40 mg by mouth daily. (Patient not taking: Reported on 04/02/2023)     No current facility-administered medications for this visit.    Allergies:   Iron and Tape   Social History:  The patient  reports that he has been smoking cigarettes. He started smoking about 34 years ago. He  has a 17.50 pack-year smoking history. He has never used smokeless tobacco. He reports that he does not drink alcohol and does not use drugs.   Family History:  The patient's family history includes Colon polyps in his mother; Depression in his mother; Diabetes in his father; Diabetes Mellitus II in his mother; Heart disease in his father, maternal grandfather, and maternal grandmother; High Cholesterol in his father and mother; Hypertension in his father and mother.  ROS:  Please see the history of present illness.    All other systems are reviewed and otherwise negative.   PHYSICAL EXAM:  VS:  There were no vitals taken for this visit. BMI: There is no height or weight on file to calculate BMI. Well nourished, well developed, in no acute distress HEENT: normocephalic, atraumatic Neck: no JVD, carotid bruits or masses Cardiac:  RRR; no significant murmurs, no rubs, or gallops Lungs:  CTA b/l, no wheezing, rhonchi or rales Abd: soft, nontender MS: RLE prosthesis Ext: trace- 1+ edema LLE to below mid shin, chronic/scaling looking skin changes, shoe was not removed Skin: warm and dry, no rash Neuro:  No gross deficits appreciated Psych: euthymic mood, full affect    EKG:  done today and reviewed by myself SR 85bpm, no changes    April 2024 monitor Predominant rhythm was sinus rhythm 752 SVT episodes, longest and fastest  19.2 seconds average 144 bpm. Less than 1% ventricular and supraventricular ectopy Triggered episodes associated with sinus tachycardia   May 2023: monitor Sinus rhythm. Minimum heart rate of 59 bpm. Maximum heart rate of 171 bpm.  1 run of Ventricular Tachycardia occurred lasting 5 beats  47 Supraventricular Tachycardia  runs occurred, the longest lasting 2 mins 8 seconds. Supraventricular Tachycardia was detected within +/- 45 seconds of symptomatic patient event(s). Rare Premature atrial contractions (<1%) Rare premature ventricular contractions. (<1.0%, 1357)  03/31/2022: TTE 1. Left ventricular ejection fraction, by estimation, is 60 to 65%. The  left ventricle has normal function. The left ventricle has no regional  wall motion abnormalities. There is mild left ventricular hypertrophy.  Left ventricular diastolic parameters  were normal.   2. Right ventricular systolic function is normal. The right ventricular  size is normal. Tricuspid regurgitation signal is inadequate for assessing  PA pressure.   3. The mitral valve is normal in structure. No evidence of mitral valve  regurgitation. No evidence of mitral stenosis.   4. The aortic valve has an indeterminant number of cusps. Aortic valve  regurgitation is not visualized. No aortic stenosis is present.   5. The inferior vena cava is normal in size with greater than 50%  respiratory variability, suggesting right atrial pressure of 3 mmHg.   Comparison(s): No prior Echocardiogram.    Recent Labs: 09/10/2022: B Natriuretic Peptide 33.0 02/12/2023: Magnesium 1.8 02/15/2023: ALT 10 02/22/2023: Platelets 276 03/01/2023: BUN 56; Creatinine, Ser 4.73; Potassium 5.0; Sodium 143 04/08/2023: Hemoglobin 9.6  No results found for requested labs within last 365 days.   CrCl cannot be calculated (Patient's most recent lab result is older than the maximum 21 days allowed.).   Wt Readings from Last 3 Encounters:  04/02/23 234 lb (106.1 kg)   03/01/23 234 lb 3.2 oz (106.2 kg)  02/25/23 228 lb 9.6 oz (103.7 kg)     Other studies reviewed: Additional studies/records reviewed today include: summarized above  ASSESSMENT AND PLAN:  SVT Increase his Toprol to 50mg  daily  He will communicate via my chart/call if no improvement in palpitations with this  increase in Toprol  2. HTN Better today, room for more BB  3. Fatigued today, a little winded walking in BP, BS, EKG look ok Check CBC   Disposition: otherwise back in 3 mo, sooner if needed   Current medicines are reviewed at length with the patient today.  The patient did not have any concerns regarding medicines.  Norma Fredrickson, PA-C 04/11/2023 1:54 PM     [facility] [address] [place] [address] [phone] (office)  [phone] (fax)

## 2023-04-13 ENCOUNTER — Encounter: Payer: Self-pay | Admitting: Physical Therapy

## 2023-04-13 ENCOUNTER — Ambulatory Visit: Payer: 59 | Attending: Physician Assistant | Admitting: Physician Assistant

## 2023-04-13 ENCOUNTER — Encounter: Payer: Self-pay | Admitting: Physician Assistant

## 2023-04-13 ENCOUNTER — Ambulatory Visit: Payer: 59 | Attending: Physical Medicine and Rehabilitation | Admitting: Physical Therapy

## 2023-04-13 VITALS — BP 124/85

## 2023-04-13 VITALS — BP 158/62 | HR 92 | Ht 77.0 in | Wt 232.0 lb

## 2023-04-13 DIAGNOSIS — R296 Repeated falls: Secondary | ICD-10-CM | POA: Diagnosis present

## 2023-04-13 DIAGNOSIS — R269 Unspecified abnormalities of gait and mobility: Secondary | ICD-10-CM

## 2023-04-13 DIAGNOSIS — I471 Supraventricular tachycardia, unspecified: Secondary | ICD-10-CM

## 2023-04-13 DIAGNOSIS — R2681 Unsteadiness on feet: Secondary | ICD-10-CM | POA: Diagnosis present

## 2023-04-13 DIAGNOSIS — R0602 Shortness of breath: Secondary | ICD-10-CM

## 2023-04-13 DIAGNOSIS — I10 Essential (primary) hypertension: Secondary | ICD-10-CM

## 2023-04-13 MED ORDER — METOPROLOL SUCCINATE ER 50 MG PO TB24: 50.0000 mg | ORAL_TABLET | Freq: Every day | ORAL | 3 refills | Status: DC

## 2023-04-13 NOTE — Patient Instructions (Signed)
Medication Instructions:    START TAKING: TOPROL 50 MG ONCE A DAY   *If you need a refill on your cardiac medications before your next appointment, please call your pharmacy*   Lab Work:  CBC TODAY   If you have labs (blood work) drawn today and your tests are completely normal, you will receive your results only by: MyChart Message (if you have MyChart) OR A paper copy in the mail If you have any lab test that is abnormal or we need to change your treatment, we will call you to review the results.   Testing/Procedures: NONE ORDERED  TODAY     Follow-Up: At [facility], you and your health needs are our priority.  As part of our continuing mission to provide you with exceptional heart care, we have created designated Provider Care Teams.  These Care Teams include your primary Cardiologist (physician) and Advanced Practice Providers (APPs -  Physician Assistants and Nurse Practitioners) who all work together to provide you with the care you need, when you need it.  We recommend signing up for the patient portal called "MyChart".  Sign up information is provided on this After Visit Summary.  MyChart is used to connect with patients for Virtual Visits (Telemedicine).  Patients are able to view lab/test results, encounter notes, upcoming appointments, etc.  Non-urgent messages can be sent to your provider as well.   To learn more about what you can do with MyChart, go to [email or web address].    Your next appointment:   3 month(s)  Provider:   Loman Brooklyn, MD or Francis Dowse, PA-C    Other Instructions

## 2023-04-13 NOTE — Therapy (Signed)
OUTPATIENT PHYSICAL THERAPY PROSTHETICS EVALUATION   Patient Name: [name] [name] MRN: [id number] DOB:Dec 10, 1970, 52 y.o., male Today's Date: 04/13/2023  PCP: [name], MD REFERRING PROVIDER: Ancil Boozer, DPM  END OF SESSION:  PT End of Session - 04/13/23 1105     Visit Number 1    Number of Visits 13    Date for PT Re-Evaluation 06/08/23    Authorization Type [employer]    Progress Note Due on Visit 10    PT Start Time 1103    PT Stop Time 1153    PT Time Calculation (min) 50 min    Equipment Utilized During Treatment Gait belt    Activity Tolerance Patient tolerated treatment well    Behavior During Therapy WFL for tasks assessed/performed             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)    Depression    Diabetes mellitus without complication (HCC) 2019   GERD (gastroesophageal reflux disease)    Hyperlipidemia    Hypertension    Necrotizing fasciitis (HCC) 10/13/2017   Neuromuscular disorder (HCC)    neuropathy   PAD (peripheral artery disease) (HCC)    Secondary hyperparathyroidism (HCC)    Wound dehiscence 11/24/2017   Past Surgical History:  Procedure Laterality Date   AMPUTATION Right 10/13/2017   Procedure: RIGHT BELOW KNEE AMPUTATION;  Surgeon: Nadara Mustard, MD;  Location: [facility] OR;  Service: Orthopedics;  Laterality: Right;   AMPUTATION Right 11/24/2017   Procedure: AMPUTATION BELOW KNEE REVISION;  Surgeon: Nadara Mustard, MD;  Location: [facility] OR;  Service: Orthopedics;  Laterality: Right;   BIOPSY  09/02/2021   Procedure: BIOPSY;  Surgeon: Imogene Burn, MD;  Location: [facility] ENDOSCOPY;  Service: Gastroenterology;;   BIOPSY  02/18/2023   Procedure: BIOPSY;  Surgeon: Benancio Deeds, MD;  Location: [facility] ENDOSCOPY;  Service:  Gastroenterology;;   COLONOSCOPY  05/11/2019   ESOPHAGOGASTRODUODENOSCOPY (EGD) WITH PROPOFOL N/A 09/02/2021   Procedure: ESOPHAGOGASTRODUODENOSCOPY (EGD) WITH PROPOFOL;  Surgeon: Imogene Burn, MD;  Location: [facility] ENDOSCOPY;  Service: Gastroenterology;  Laterality: N/A;   ESOPHAGOGASTRODUODENOSCOPY (EGD) WITH PROPOFOL N/A 02/18/2023   Procedure: ESOPHAGOGASTRODUODENOSCOPY (EGD) WITH PROPOFOL;  Surgeon: Benancio Deeds, MD;  Location: [facility] ENDOSCOPY;  Service: Gastroenterology;  Laterality: N/A;   NO PAST SURGERIES     POLYPECTOMY     WISDOM TOOTH EXTRACTION     Patient Active Problem List   Diagnosis Date Noted   Mucosal abnormality of esophagus 02/18/2023   Foot pain, left 02/17/2023   SOB (shortness of breath) 02/16/2023   History of esophagitis 02/16/2023   Tachycardia 02/15/2023   Allergic rhinitis 11/07/2022   Healthcare maintenance 11/07/2022   Stage 5 chronic kidney disease (HCC) 09/11/2022   Tobacco dependence 09/11/2022   GERD with esophagitis 09/02/2021   AKI (acute kidney injury) (HCC) 04/10/2021   Achilles tendon contracture, left 03/03/2018   Non-pressure chronic ulcer of other part of left foot limited to breakdown of skin (HCC) 02/02/2018   History of right below knee amputation (HCC) 12/22/2017   Acute posthemorrhagic anemia    Falls 11/24/2017   Essential hypertension 11/24/2017   Normocytic normochromic anemia 11/24/2017   Uncontrolled type 2 diabetes mellitus with hyperglycemia, with long-term current use of insulin (HCC)  10/11/2017   ARF (acute renal failure) (HCC) 10/11/2017    ONSET DATE: 04/09/2023 (referral)  REFERRING DIAG: R26.89 (ICD-10-CM) - Other abnormalities of gait and mobility  THERAPY DIAG:  Abnormality of gait and mobility - Plan: PT plan of care cert/re-cert  Unsteadiness on feet - Plan: PT plan of care cert/re-cert  Repeated falls - Plan: PT plan of care cert/re-cert  Rationale for Evaluation and Treatment:  Rehabilitation  SUBJECTIVE:   SUBJECTIVE STATEMENT: Patient was briefly pursuing being on the kidney transplant list; however, they are not pursuing at this time due to patient stating he needs to use a 2WW and uncontrolled sugars. Patient reports that he is here specifically to work on his balance. He cannot walk very long distances, limited to less than block or two. Patient reports multiple falls due to legs getting weak; he thinks his sugars may be at play. Patient reports that he has never hit his head with any of his falls and is able to get up by himself. Patient has not seen [facility] in about a year. States he cannot afford to get a new sleeve (inside liner) for his prosthetic. Patient is always ambulating with SPC with quad tip. Patient reports that he feels like his left leg gives out when he falls. Patient also supposed to pickup diabetic shoe but he cannot afford them.   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 Yes: NPRS scale: 5/10 Pain location: L foot around where big toe used to be and back of heel Pain description: throbbing Aggravating factors: walking Relieving factors: not walking, sleeping  PRECAUTIONS: Fall  WEIGHT BEARING RESTRICTIONS: No  FALLS: Has patient fallen in last 6 months? Yes. Number of falls 5  LIVING ENVIRONMENT: Lives with: lives with their family Lives in: House/apartment Home Access: Stairs to enter Home layout: One level Stairs: Yes: External: 4 steps; can reach both Has following equipment at home: Environmental consultant - 2 wheeled, Wheelchair (manual), shower chair, Grab bars, and SPC cane with quad tip  PLOF: Independent  PATIENT GOALS: "Be able to walk without a cane and without falling and be able to walk up/down hills."  OBJECTIVE:   DIAGNOSTIC FINDINGS:    12/08/2022 DG Foot Complete: IMPRESSION: 1. New curvilinear lucencies throughout the proximal 25% of the distal phalanx of the great toe, an acute to subacute comminuted fracture with intra-articular extension. 2. Apparent chronic soft tissue ulcer at the plantar aspect of the forefoot at the level of the distal metatarsals, possibly at the great toe metatarsal head, similar to 01/18/2022. No definite cortical erosion.  COGNITION: Overall cognitive status: Within functional limits for tasks assessed   SENSATION: Decreased LE changes related to diabetic neuropathy and bilateral LE  POSTURE: rounded shoulders and forward head  LOWER EXTREMITY ROM:  Decreased ankle dorsiflexion and hip extension noted with functional testing  LOWER EXTREMITY MMT:  MMT Right eval Left eval  Hip flexion 3/5 3+/5  Hip extension    Hip abduction    Hip adduction    Hip internal rotation    Hip external rotation    Knee flexion 3/5 3+/5  Knee extension 3/5 3+/5  Ankle dorsiflexion  3+/5  Ankle plantarflexion    Ankle inversion    Ankle eversion    (Blank rows = not tested)  BED MOBILITY:  Patient reports  that he is able to do so independently  TRANSFERS: Sit to stand: SBA Stand to sit: SBA Strong reliance on arms for transfers  GAIT: Gait pattern: decreased step length- Left, decreased stance time- Right, decreased stance time- Left, decreased stride length, trendelenburg, and wide BOS Distance walked: 2 x 80 feet Assistive device utilized:  SPC with quad rubber tip Level of assistance: SBA Prosthetic pistoning during stance.   FUNCTIONAL TESTS:    [facility] PT Assessment - 04/13/23 0001       Standardized Balance Assessment   Standardized Balance Assessment Five Times Sit to Stand;Timed Up and Go Test;10 meter walk test    Five times sit to stand comments  27 sec   strong reliance on UE to come to stand (SBA)   10 Meter Walk 0.80 m/s   with SPC quad rubber tip cane (SBA)      Timed Up and Go Test   Normal TUG (seconds) 18.39   with SPC quad rubber tip cane (SBA)            CURRENT PROSTHETIC WEAR ASSESSMENT: Patient is independent with: ply sock cleaning, proper wear schedule/adjustment, and proper weight-bearing schedule/adjustment Patient is dependent with: skin check, residual limb care, and care of non-amputated limb Donning prosthesis: Complete Independence Doffing prosthesis: Complete Independence Prosthetic wear tolerance: 10 hours/day, 7 days/week Prosthetic weight bearing tolerance: 20-30 minutes Edema: no swelling noted Residual limb condition: redness noted behind knee on R side and slight redness noted on anterior aspect of knee superior to patellar pole, on left foot slight scap noted on healing scar, patient following up with podiatry this week Prosthetic description: Unknown will follow up with [facility], socket liner poor fit with gapping along thigh K code/activity level with prosthetic use: Will clarify with [facility]  PATIENT SURVEYS:  ABC scale : 58.1% Confidence   TODAY'S TREATMENT:                                                                                                                                          TREATMENT:  PATIENT EDUCATED ON FOLLOWING PROSTHETIC CARE: Other education  Skin check - Educated on regular skin check given past medical history, discussed techniques including use of mirror to assess difficult to see areas, recommended checking every day, discussed skin changes noted in today's session  PATIENT EDUCATION: Education details: POC, examination findings, results, skin checks Person educated: Patient Education method: Explanation Education comprehension: verbalized understanding and needs further education  HOME EXERCISE PROGRAM: To be provided  ASSESSMENT:  CLINICAL IMPRESSION: Patient is a 52 y.o. male who was seen today for physical therapy evaluation and treatment for frequent falls and other  impairments of gait/mobility. Patient presents with impairments including increased risk for falls as indicated by Activities Specific Balance Score, TUG, 5xSTS, and 10 MWT. Patient also demonstrates reduced safety measures reporting no skin checks. Prosthetic limb also notable poor  fit with gapping noted at top of lining in addition to pistoning noted during gait. Patient reports financially being unable to cover copays needed to get better fitting limb. Patient will benefit from skilled physical therapy services to address impairments in order to reduce risk of future falls, skin breakdown, and progress towards patient goal of being able to navigate for longer distances and in more complex environments.   OBJECTIVE IMPAIRMENTS: Abnormal gait, decreased activity tolerance, decreased balance, decreased mobility, difficulty walking, decreased ROM, decreased strength, impaired sensation, prosthetic dependency , and pain.   ACTIVITY LIMITATIONS: carrying, squatting, transfers, and locomotion level  PARTICIPATION LIMITATIONS: shopping, community activity, and yard work  PERSONAL FACTORS: Social background and 3+ comorbidities: see above  are also affecting patient's functional outcome.   REHAB POTENTIAL: Good  CLINICAL DECISION MAKING: Evolving/moderate complexity  EVALUATION COMPLEXITY: Moderate   GOALS: Goals reviewed with patient? Yes  SHORT TERM GOALS: Target date: 05/04/2023  Patient will demonstrate 100% compliance with initial HEP to continue to progress between physical therapy sessions.   Baseline: To be provided Goal status: INITIAL  2.  Assess ramp and write goal as indicated Baseline: To be assessed Goal status: INITIAL   LONG TERM GOALS: Target date: 05/25/2023  Patient will report demonstrate independence with final HEP in order to maintain current gains and continue to progress after physical therapy discharge.   Baseline:  Goal status: INITIAL  2.  Patient will improve  their 5x Sit to Stand score to less than 20 seconds with minimal required UE support to demonstrate a decreased risk for falls and improved LE strength.   Baseline: 27 sec without UE support Goal status: INITIAL  3.  Patient will improve gait speed to 0.9 m/s to indicate improvement to the level of community ambulator in order to participate more easily in activities outside of the home.   Baseline: 0.8 m/s with SPC quad tip cane (SBA) Goal status: INITIAL  4.  Patient will improve TUG score to 13 seconds to indicate clinically significant progress towards a decreased risk of falls and improved mobility.  Baseline: 18.39 sec with SPC quad tip cane (SBA) Goal status: INITIAL  5.  Patient will improve ABC Score to 67% or greater to indicate a decreased risk for falls and improved self-reported confidence in balance and sense of steadiness.   Baseline: 58.1% Goal status: INITIAL  6.  Ramp to be assessed/goal written as indicated Baseline: To be assessed Goal status: INITIAL   PLAN:  PT FREQUENCY: 2x/week  PT DURATION: 6 weeks  PLANNED INTERVENTIONS: Therapeutic exercises, Therapeutic activity, Neuromuscular re-education, Balance training, Gait training, Patient/Family education, Self Care, Joint mobilization, Manual therapy, and Re-evaluation  PLAN FOR NEXT SESSION: amputee coalition to supplement insurance for new liner (provide info), assess ramp (to simulate hills) and write goal as indicated, create initial HEP   Carmelia Bake, PT, DPT 04/13/2023, 12:35 PM

## 2023-04-14 ENCOUNTER — Telehealth: Payer: Self-pay | Admitting: *Deleted

## 2023-04-14 LAB — CBC
Hematocrit: 25.6 % — ABNORMAL LOW (ref 37.5–51.0)
Hemoglobin: 8.2 g/dL — ABNORMAL LOW (ref 13.0–17.7)
MCH: 28.7 pg (ref 26.6–33.0)
MCHC: 32 g/dL (ref 31.5–35.7)
MCV: 90 fL (ref 79–97)
Platelets: 206 10*3/uL (ref 150–450)
RBC: 2.86 x10E6/uL — ABNORMAL LOW (ref 4.14–5.80)
RDW: 15.5 % — ABNORMAL HIGH (ref 11.6–15.4)
WBC: 8.7 10*3/uL (ref 3.4–10.8)

## 2023-04-14 NOTE — Telephone Encounter (Signed)
Lvm for patient to call back for results and recommendations on direct number [phone]

## 2023-04-14 NOTE — Telephone Encounter (Signed)
-----   Message from [facility] [place], [place] sent at 04/14/2023  7:45 AM EDT ----- Blood counts are down again, though not as low as they were/have been in the hospital.  I think can be contributing to his symptoms  If he can be seen by his PMD or nephrologist TODAY to recheck and manage this, otherwise I think he should go to the ER have repeat labs and further evaluation as needed/indicated

## 2023-04-20 ENCOUNTER — Encounter: Payer: Self-pay | Admitting: Physical Therapy

## 2023-04-20 ENCOUNTER — Ambulatory Visit: Payer: 59 | Admitting: Physical Therapy

## 2023-04-20 DIAGNOSIS — R2681 Unsteadiness on feet: Secondary | ICD-10-CM

## 2023-04-20 DIAGNOSIS — R296 Repeated falls: Secondary | ICD-10-CM

## 2023-04-20 DIAGNOSIS — R269 Unspecified abnormalities of gait and mobility: Secondary | ICD-10-CM

## 2023-04-20 NOTE — Patient Instructions (Signed)
Access Code: [email or web address] URL: [email or web address] Date: 04/20/2023 Prepared by: Camille Bal  Exercises - Seated Ankle Inversion Eversion AROM  - 1 x daily - 5 x weekly - 2-3 sets - 10 reps - Sit to Stand with Armchair  - 1 x daily - 7 x weekly - 2 sets - 8 reps - Standing Balance in Corner with Eyes Closed  - 1 x daily - 5 x weekly - 3 sets - 10 reps - Standing March with Counter Support  - 1 x daily - 5 x weekly - 2 sets - 20 reps

## 2023-04-20 NOTE — Therapy (Signed)
OUTPATIENT PHYSICAL THERAPY PROSTHETICS TREATMENT   Patient Name: [name] [name] MRN: [id number] DOB:Oct 06, 1971, 52 y.o., male Today's Date: 04/20/2023  PCP: [name], MD REFERRING PROVIDER: Ancil Boozer, DPM  END OF SESSION:  PT End of Session - 04/20/23 1619     Visit Number 2    Number of Visits 13    Date for PT Re-Evaluation 06/08/23    Authorization Type [employer]    Progress Note Due on Visit 10    PT Start Time 1616    PT Stop Time 1700    PT Time Calculation (min) 44 min    Equipment Utilized During Treatment Gait belt    Activity Tolerance Patient tolerated treatment well    Behavior During Therapy WFL for tasks assessed/performed;Flat affect             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)    Depression    Diabetes mellitus without complication (HCC) 2019   GERD (gastroesophageal reflux disease)    Hyperlipidemia    Hypertension    Necrotizing fasciitis (HCC) 10/13/2017   Neuromuscular disorder (HCC)    neuropathy   PAD (peripheral artery disease) (HCC)    Secondary hyperparathyroidism (HCC)    Wound dehiscence 11/24/2017   Past Surgical History:  Procedure Laterality Date   AMPUTATION Right 10/13/2017   Procedure: RIGHT BELOW KNEE AMPUTATION;  Surgeon: Nadara Mustard, MD;  Location: [facility] OR;  Service: Orthopedics;  Laterality: Right;   AMPUTATION Right 11/24/2017   Procedure: AMPUTATION BELOW KNEE REVISION;  Surgeon: Nadara Mustard, MD;  Location: [facility] OR;  Service: Orthopedics;  Laterality: Right;   BIOPSY  09/02/2021   Procedure: BIOPSY;  Surgeon: Imogene Burn, MD;  Location: [facility] ENDOSCOPY;  Service: Gastroenterology;;   BIOPSY  02/18/2023   Procedure: BIOPSY;  Surgeon: Benancio Deeds, MD;  Location: [facility] ENDOSCOPY;   Service: Gastroenterology;;   COLONOSCOPY  05/11/2019   ESOPHAGOGASTRODUODENOSCOPY (EGD) WITH PROPOFOL N/A 09/02/2021   Procedure: ESOPHAGOGASTRODUODENOSCOPY (EGD) WITH PROPOFOL;  Surgeon: Imogene Burn, MD;  Location: [facility] ENDOSCOPY;  Service: Gastroenterology;  Laterality: N/A;   ESOPHAGOGASTRODUODENOSCOPY (EGD) WITH PROPOFOL N/A 02/18/2023   Procedure: ESOPHAGOGASTRODUODENOSCOPY (EGD) WITH PROPOFOL;  Surgeon: Benancio Deeds, MD;  Location: [facility] ENDOSCOPY;  Service: Gastroenterology;  Laterality: N/A;   NO PAST SURGERIES     POLYPECTOMY     WISDOM TOOTH EXTRACTION     Patient Active Problem List   Diagnosis Date Noted   Mucosal abnormality of esophagus 02/18/2023   Foot pain, left 02/17/2023   SOB (shortness of breath) 02/16/2023   History of esophagitis 02/16/2023   Tachycardia 02/15/2023   Allergic rhinitis 11/07/2022   Healthcare maintenance 11/07/2022   Stage 5 chronic kidney disease (HCC) 09/11/2022   Tobacco dependence 09/11/2022   GERD with esophagitis 09/02/2021   AKI (acute kidney injury) (HCC) 04/10/2021   Achilles tendon contracture, left 03/03/2018   Non-pressure chronic ulcer of other part of left foot limited to breakdown of skin (HCC) 02/02/2018   History of right below knee amputation (HCC) 12/22/2017   Acute posthemorrhagic anemia    Falls 11/24/2017   Essential hypertension 11/24/2017   Normocytic normochromic anemia 11/24/2017   Uncontrolled type 2 diabetes mellitus with hyperglycemia, with long-term current use of insulin (  HCC) 10/11/2017   ARF (acute renal failure) (HCC) 10/11/2017    ONSET DATE: 04/09/2023 (referral)  REFERRING DIAG: R26.89 (ICD-10-CM) - Other abnormalities of gait and mobility  THERAPY DIAG:  Abnormality of gait and mobility  Unsteadiness on feet  Repeated falls  Rationale for Evaluation and Treatment: Rehabilitation  SUBJECTIVE:   SUBJECTIVE STATEMENT: Blood sugar is 230 prior to onset of session.  Pt continues to have  left foot pain.  He denies falls, but has had varying near falls.  He ambulates into clinic using SPC w/ rubber quad tip.  He is wearing 1-ply sock today.  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 Yes: NPRS scale: 5/10 Pain location: L foot around where big toe used to be and back of heel Pain description: throbbing Aggravating factors: walking Relieving factors: not walking, sleeping  PRECAUTIONS: Fall  WEIGHT BEARING RESTRICTIONS: No  FALLS: Has patient fallen in last 6 months? Yes. Number of falls 5  LIVING ENVIRONMENT: Lives with: lives with their family Lives in: House/apartment Home Access: Stairs to enter Home layout: One level Stairs: Yes: External: 4 steps; can reach both Has following equipment at home: Environmental consultant - 2 wheeled, Wheelchair (manual), shower chair, Grab bars, and SPC cane with quad tip  PLOF: Independent  PATIENT GOALS: "Be able to walk without a cane and without falling and be able to walk up/down hills."  OBJECTIVE:   DIAGNOSTIC FINDINGS:   12/08/2022 DG Foot Complete: IMPRESSION: 1. New curvilinear lucencies throughout the proximal 25% of the distal phalanx of the great toe, an acute to subacute comminuted fracture with intra-articular extension. 2. Apparent chronic soft tissue ulcer at the plantar aspect of the forefoot at the level of the distal metatarsals, possibly at the great toe metatarsal head, similar to 01/18/2022. No definite cortical erosion.  COGNITION: Overall cognitive status: Within functional limits for tasks assessed   SENSATION: Decreased LE changes related to diabetic neuropathy and bilateral LE  POSTURE: rounded shoulders and forward head  LOWER EXTREMITY ROM:  Decreased ankle dorsiflexion and hip  extension noted with functional testing  LOWER EXTREMITY MMT:  MMT Right eval Left eval  Hip flexion 3/5 3+/5  Hip extension    Hip abduction    Hip adduction    Hip internal rotation    Hip external rotation    Knee flexion 3/5 3+/5  Knee extension 3/5 3+/5  Ankle dorsiflexion  3+/5  Ankle plantarflexion    Ankle inversion    Ankle eversion    (Blank rows = not tested)  BED MOBILITY:  Patient reports that he is able to do so independently  TRANSFERS: Sit to stand: SBA Stand to sit: SBA Strong reliance on arms for transfers  GAIT: Gait pattern: decreased step length- Left, decreased stance time- Right, decreased stance time- Left, decreased stride length, trendelenburg, and wide BOS Distance walked: 2 x 80 feet Assistive device utilized:  SPC with quad rubber tip Level of assistance: SBA Prosthetic pistoning during stance.   FUNCTIONAL TESTS:      CURRENT PROSTHETIC WEAR ASSESSMENT: Patient is independent with: ply sock cleaning, proper wear schedule/adjustment, and proper weight-bearing schedule/adjustment Patient is dependent with: skin check, residual limb care, and care of non-amputated limb Donning prosthesis: Complete Independence Doffing prosthesis: Complete Independence Prosthetic wear tolerance: 10 hours/day, 7  days/week Prosthetic weight bearing tolerance: 20-30 minutes Edema: no swelling noted Residual limb condition: redness noted behind knee on R side and slight redness noted on anterior aspect of knee superior to patellar pole, on left foot slight scap noted on healing scar, patient following up with podiatry this week Prosthetic description: Unknown will follow up with [facility], socket liner poor fit with gapping along thigh K code/activity level with prosthetic use: Will clarify with [facility]  PATIENT SURVEYS:  ABC scale : 58.1% Confidence   TODAY'S TREATMENT:                                                                                                                                          -Provided and reviewed resources from amputee coalition (printed list and website).  Encouraged using this to pursue funds for a new liner and possibly updated orthotic shoe for left foot vs orthotic update to include cast for amputated toe and subsequent posture changes of the ankle.  RAMP:  Level of Assistance: CGA and Min A Assistive device utilized: Single point cane-w/ rubber quad tip Ramp Comments: Pt completes x1 w/ MinA on ascent due to inadequate anterior weight shift likely related to severe kyphotic posture and left foot posturing.  GAIT: Gait pattern: step through pattern and decreased arm swing- Right Distance walked: 400' Assistive device utilized: Single point cane Level of assistance: SBA and CGA Comments: Severe supination of left foot during stance and toe off creating inadequate step of RLE and instability in left stance-related to loss of left great toe w/o orthotic filler in shoe.  Pt unable to correct supination in stance creating difficulty in terminal stance w/ toe off.  Initiated HEP: -Seated left ankle inversion and eversion several reps performed with moderate cuing and return demo -STS x8 w/ BUE support, attempted sitting w/o UE support w/ pt hesistant to do so resulting in plopping onto mat, encouraged UE use at home and even BOS as socket allows to improved reliance on RLE -Standing in corner feet apart eyes closed w/ significant sway so modified task to Bilateral fingertip support 2x45 seconds -Standing marches at countertop w/ BUE support, pt has increased sway and marked ankle strategy noted in left stance x20  TREATMENT:  PATIENT EDUCATED ON FOLLOWING PROSTHETIC CARE: Other education  Skin check - Educated on regular skin check given past medical history, discussed techniques including use of mirror to assess difficult to see areas, recommended checking every day, discussed skin changes noted in today's  session  PATIENT EDUCATION: Education details: Initial HEP and discussion of amputee resources. Person educated: Patient Education method: Explanation Education comprehension: verbalized understanding and needs further education  HOME EXERCISE PROGRAM: Access Code: [email or web address] URL: [email or web address] Date: 04/20/2023 Prepared by: Camille Bal  Exercises - Seated Ankle Inversion Eversion AROM  - 1 x daily - 5 x weekly - 2-3 sets - 10 reps - Sit to  Stand with Armchair  - 1 x daily - 7 x weekly - 2 sets - 8 reps - Standing Balance in Corner with Eyes Closed  - 1 x daily - 5 x weekly - 3 sets - 10 reps - Standing March with Counter Support  - 1 x daily - 5 x weekly - 2 sets - 20 reps  ASSESSMENT:  CLINICAL IMPRESSION: Assessed ramp with goal set to progress to SBA as pt has trouble on incline due to left foot posturing and general chronic thoracic changes.  Initiated HEP to address possible flexible left ankle deformity, general LE strength, and static balance.  He continues to be challenged by reliance on visual acuity vs somatosensation or vestibular input.  Will continue to address deficits as outlined in ongoing skilled PT POC.   OBJECTIVE IMPAIRMENTS: Abnormal gait, decreased activity tolerance, decreased balance, decreased mobility, difficulty walking, decreased ROM, decreased strength, impaired sensation, prosthetic dependency , and pain.   ACTIVITY LIMITATIONS: carrying, squatting, transfers, and locomotion level  PARTICIPATION LIMITATIONS: shopping, community activity, and yard work  PERSONAL FACTORS: Social background and 3+ comorbidities: see above  are also affecting patient's functional outcome.   REHAB POTENTIAL: Good  CLINICAL DECISION MAKING: Evolving/moderate complexity  EVALUATION COMPLEXITY: Moderate   GOALS: Goals reviewed with patient? Yes  SHORT TERM GOALS: Target date: 05/04/2023  Patient will demonstrate 100% compliance with initial  HEP to continue to progress between physical therapy sessions.   Baseline: To be provided Goal status: INITIAL  2.  Assess ramp and write goal as indicated Baseline: Assessed 5/28 and goal written. Goal status: MET   LONG TERM GOALS: Target date: 05/25/2023  Patient will report demonstrate independence with final HEP in order to maintain current gains and continue to progress after physical therapy discharge.   Baseline:  Goal status: INITIAL  2.  Patient will improve their 5x Sit to Stand score to less than 20 seconds with minimal required UE support to demonstrate a decreased risk for falls and improved LE strength.   Baseline: 27 sec without UE support Goal status: INITIAL  3.  Patient will improve gait speed to 0.9 m/s to indicate improvement to the level of community ambulator in order to participate more easily in activities outside of the home.   Baseline: 0.8 m/s with SPC quad tip cane (SBA) Goal status: INITIAL  4.  Patient will improve TUG score to 13 seconds to indicate clinically significant progress towards a decreased risk of falls and improved mobility.  Baseline: 18.39 sec with SPC quad tip cane (SBA) Goal status: INITIAL  5.  Patient will improve ABC Score to 67% or greater to indicate a decreased risk for falls and improved self-reported confidence in balance and sense of steadiness.   Baseline: 58.1% Goal status: INITIAL  6.  Pt will ambulate up and down ramp incline no more than SBA using LRAD in order to improve unlevel surface management and functional safety. Baseline: CGA-minA w/ SPC w/ quad tip (5/28) Goal status: INITIAL   PLAN:  PT FREQUENCY: 2x/week  PT DURATION: 6 weeks  PLANNED INTERVENTIONS: Therapeutic exercises, Therapeutic activity, Neuromuscular re-education, Balance training, Gait training, Patient/Family education, Self Care, Joint mobilization, Manual therapy, and Re-evaluation  PLAN FOR NEXT SESSION: Did he apply for any financial  assistance? Gait training w/ SPC w/ quad rubber tip-ramp/curb/stairs/unlevel ground, further assess prosthetic-K2, ankle joint type?  Have pt doff prosthetic to assess how loose liner is and find source of pistoning.  Add to HEP prn.  Sadie Haber, PT, DPT 04/20/2023, 5:13 PM

## 2023-04-22 ENCOUNTER — Encounter (HOSPITAL_COMMUNITY): Payer: 59

## 2023-04-22 ENCOUNTER — Ambulatory Visit: Payer: 59 | Admitting: Physical Therapy

## 2023-04-22 ENCOUNTER — Encounter (HOSPITAL_COMMUNITY)
Admission: RE | Admit: 2023-04-22 | Discharge: 2023-04-22 | Disposition: A | Discharge status: Home / Self Care | Payer: 59 | Source: Ambulatory Visit | Attending: Nephrology | Admitting: Nephrology

## 2023-04-22 ENCOUNTER — Encounter: Payer: Self-pay | Admitting: Physical Therapy

## 2023-04-22 VITALS — BP 143/92 | HR 93 | Temp 97.6°F | Resp 18

## 2023-04-22 DIAGNOSIS — N179 Acute kidney failure, unspecified: Secondary | ICD-10-CM

## 2023-04-22 DIAGNOSIS — R296 Repeated falls: Secondary | ICD-10-CM

## 2023-04-22 DIAGNOSIS — D649 Anemia, unspecified: Secondary | ICD-10-CM

## 2023-04-22 DIAGNOSIS — R269 Unspecified abnormalities of gait and mobility: Secondary | ICD-10-CM | POA: Diagnosis not present

## 2023-04-22 DIAGNOSIS — D62 Acute posthemorrhagic anemia: Secondary | ICD-10-CM

## 2023-04-22 DIAGNOSIS — R2681 Unsteadiness on feet: Secondary | ICD-10-CM

## 2023-04-22 LAB — POCT HEMOGLOBIN-HEMACUE: Hemoglobin: 9.6 g/dL — ABNORMAL LOW (ref 13.0–17.0)

## 2023-04-22 MED ORDER — EPOETIN ALFA-EPBX 10000 UNIT/ML IJ SOLN
INTRAMUSCULAR | Status: AC
  Filled 2023-04-22: qty 2

## 2023-04-22 MED ORDER — EPOETIN ALFA-EPBX 10000 UNIT/ML IJ SOLN
20000.0000 [IU] | INTRAMUSCULAR | Status: DC
  Administered 2023-04-22: 20000 [IU] via SUBCUTANEOUS

## 2023-04-22 NOTE — Therapy (Signed)
OUTPATIENT PHYSICAL THERAPY PROSTHETICS TREATMENT   Patient Name: [name] [name] MRN: [id number] DOB:[birth year], 52 y.o., male Today's Date: 04/22/2023  PCP: [name], MD REFERRING PROVIDER: Ancil Boozer, DPM  END OF SESSION:  PT End of Session - 04/22/23 1629     Visit Number 3    Number of Visits 13    Date for PT Re-Evaluation 06/08/23    Authorization Type [employer]    Progress Note Due on Visit 10    PT Start Time 1626   PT assisting pt prior   PT Stop Time 1707    PT Time Calculation (min) 41 min    Equipment Utilized During Treatment Gait belt    Activity Tolerance Patient tolerated treatment well;Patient limited by fatigue;Patient limited by pain    Behavior During Therapy Flat affect             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)    Depression    Diabetes mellitus without complication (HCC) 2019   GERD (gastroesophageal reflux disease)    Hyperlipidemia    Hypertension    Necrotizing fasciitis (HCC) 10/13/2017   Neuromuscular disorder (HCC)    neuropathy   PAD (peripheral artery disease) (HCC)    Secondary hyperparathyroidism (HCC)    Wound dehiscence 11/24/2017   Past Surgical History:  Procedure Laterality Date   AMPUTATION Right 10/13/2017   Procedure: RIGHT BELOW KNEE AMPUTATION;  Surgeon: Nadara Mustard, MD;  Location: [facility] OR;  Service: Orthopedics;  Laterality: Right;   AMPUTATION Right 11/24/2017   Procedure: AMPUTATION BELOW KNEE REVISION;  Surgeon: Nadara Mustard, MD;  Location: [place] OR;  Service: Orthopedics;  Laterality: Right;   BIOPSY  09/02/2021   Procedure: BIOPSY;  Surgeon: Imogene Burn, MD;  Location: [facility] ENDOSCOPY;  Service: Gastroenterology;;   BIOPSY  02/18/2023   Procedure: BIOPSY;  Surgeon: Benancio Deeds, MD;  Location: [facility] ENDOSCOPY;  Service: Gastroenterology;;   COLONOSCOPY  05/11/2019   ESOPHAGOGASTRODUODENOSCOPY (EGD) WITH PROPOFOL N/A 09/02/2021   Procedure: ESOPHAGOGASTRODUODENOSCOPY (EGD) WITH PROPOFOL;  Surgeon: Imogene Burn, MD;  Location: [facility] ENDOSCOPY;  Service: Gastroenterology;  Laterality: N/A;   ESOPHAGOGASTRODUODENOSCOPY (EGD) WITH PROPOFOL N/A 02/18/2023   Procedure: ESOPHAGOGASTRODUODENOSCOPY (EGD) WITH PROPOFOL;  Surgeon: Benancio Deeds, MD;  Location: [facility] ENDOSCOPY;  Service: Gastroenterology;  Laterality: N/A;   NO PAST SURGERIES     POLYPECTOMY     WISDOM TOOTH EXTRACTION     Patient Active Problem List   Diagnosis Date Noted   Mucosal abnormality of esophagus 02/18/2023   Foot pain, left 02/17/2023   SOB (shortness of breath) 02/16/2023   History of esophagitis 02/16/2023   Tachycardia 02/15/2023   Allergic rhinitis 11/07/2022   Healthcare maintenance 11/07/2022   Stage 5 chronic kidney disease (HCC) 09/11/2022   Tobacco dependence 09/11/2022   GERD with esophagitis 09/02/2021   AKI (acute kidney injury) (HCC) 04/10/2021   Achilles tendon contracture, left 03/03/2018   Non-pressure chronic ulcer of other part of left foot limited to breakdown of skin (HCC) 02/02/2018   History of right below knee amputation (HCC) 12/22/2017   Acute posthemorrhagic anemia    Falls 11/24/2017   Essential hypertension 11/24/2017   Normocytic normochromic anemia 11/24/2017   Uncontrolled type 2 diabetes mellitus  with hyperglycemia, with long-term current use of insulin (HCC) 10/11/2017   ARF (acute renal failure) (HCC) 10/11/2017    ONSET DATE: 04/09/2023 (referral)  REFERRING DIAG: R26.89 (ICD-10-CM) - Other abnormalities of gait and mobility  THERAPY DIAG:  Abnormality of gait and mobility  Unsteadiness on feet  Repeated falls  Rationale for Evaluation and Treatment: Rehabilitation  SUBJECTIVE:   SUBJECTIVE STATEMENT: Blood sugar is 154 prior to onset of  session.  Pt continues to have left foot pain.  He denies falls or recent near falls.  He ambulates into clinic using SPC w/ rubber quad tip.  He is wearing 5-ply sock today.  He states his HEP is going okay.  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 Yes: NPRS scale: 5/10 Pain location: L foot around where big toe used to be and back of heel Pain description: throbbing Aggravating factors: walking Relieving factors: not walking, sleeping  PRECAUTIONS: Fall  WEIGHT BEARING RESTRICTIONS: No  FALLS: Has patient fallen in last 6 months? Yes. Number of falls 5  LIVING ENVIRONMENT: Lives with: lives with their family Lives in: House/apartment Home Access: Stairs to enter Home layout: One level Stairs: Yes: External: 4 steps; can reach both Has following equipment at home: Environmental consultant - 2 wheeled, Wheelchair (manual), shower chair, Grab bars, and SPC cane with quad tip  PLOF: Independent  PATIENT GOALS: "Be able to walk without a cane and without falling and be able to walk up/down hills."  OBJECTIVE:   DIAGNOSTIC FINDINGS:   12/08/2022 DG Foot Complete: IMPRESSION: 1. New curvilinear lucencies throughout the proximal 25% of the distal phalanx of the great toe, an acute to subacute comminuted fracture with intra-articular extension. 2. Apparent chronic soft tissue ulcer at the plantar aspect of the forefoot at the level of the distal metatarsals, possibly at the great toe metatarsal head, similar to 01/18/2022. No definite cortical erosion.  COGNITION: Overall cognitive status: Within functional limits for tasks assessed   SENSATION: Decreased LE changes related to diabetic neuropathy and bilateral LE  POSTURE: rounded shoulders and forward head  LOWER  EXTREMITY ROM:  Decreased ankle dorsiflexion and hip extension noted with functional testing  LOWER EXTREMITY MMT:  MMT Right eval Left eval  Hip flexion 3/5 3+/5  Hip extension    Hip abduction    Hip adduction    Hip internal rotation    Hip external rotation    Knee flexion 3/5 3+/5  Knee extension 3/5 3+/5  Ankle dorsiflexion  3+/5  Ankle plantarflexion    Ankle inversion    Ankle eversion    (Blank rows = not tested)  BED MOBILITY:  Patient reports that he is able to do so independently  TRANSFERS: Sit to stand: SBA Stand to sit: SBA Strong reliance on arms for transfers  GAIT: Gait pattern: decreased step length- Left, decreased stance time- Right, decreased stance time- Left, decreased stride length, trendelenburg, and wide BOS Distance walked: 2 x 80 feet Assistive device utilized:  SPC with quad rubber tip Level of assistance: SBA Prosthetic pistoning during stance.   FUNCTIONAL TESTS:      CURRENT PROSTHETIC WEAR ASSESSMENT: Patient is independent with: ply sock cleaning, proper wear schedule/adjustment, and proper weight-bearing schedule/adjustment Patient is dependent with: skin check, residual limb care, and care of non-amputated  limb Donning prosthesis: Complete Independence Doffing prosthesis: Complete Independence Prosthetic wear tolerance: 10 hours/day, 7 days/week Prosthetic weight bearing tolerance: 20-30 minutes Edema: no swelling noted Residual limb condition: redness noted behind knee on R side and slight redness noted on anterior aspect of knee superior to patellar pole, on left foot slight scap noted on healing scar, patient following up with podiatry this week Prosthetic description: Unknown will follow up with [facility], socket liner poor fit with gapping along thigh K code/activity level with prosthetic use: Will clarify with [facility]  PATIENT SURVEYS:  ABC scale : 58.1% Confidence   TODAY'S TREATMENT:                                                                                                                                          -Reviewed sock ply colors as pt states he does not know what ply each is. -Pt doffs and re-dons liner and prosthetic independently for PT to assess source of pistoning sound when walking.  He has excess skin at bottom of residual limb which could be contributing.  He has a scabbed over small lateral invaginated area of his otherwise well-healed scar.  When questioned about skin checks he states he does not check everyday which PT encouraged due to PMH to check every day. -PT did have to fix and provide education about not allowing sock to get caught around pin during donning prosthetic. -SciFit x8 minutes using BUE/BLE on level 2.0 reduced to 1.0 halfway through time (pt tolerance) for neural priming for reciprocal movement and to shift some fluid volume for improved socket fit to eliminate the airy sound being noted during stance. -PT doffs sock for assessment of socket fit and pressure during ambulation various bouts.  PT able to determine the ankle is creating the airy sound vs socket so sock redonned as pt does better balance wise and has better comfort w/ sock. -Forwards 6" step ups x20 alt LE w/ BUE support and SBA -Lateral 6" step taps x15 each LE w/ unilateral UE support and CGA, pt minimally responsive when prompted about sudden onset of imbalance at end of task w/ eyes diverted away from therapist at ground without attempt to correct LOB requiring minA from therapist to right.  He stumbles on ambulation back to the mat and responds "I don't know" when questioned about how he's feeling.  He states tingling in bottom of left foot and when questioned about normal response to exercise he states "I don't exercise." -Standing in // bars in alternating tandem progressing to fingertip support over several minutes w/ CGA -Standing in // bars w/ feet touching progressing to RUE support only x2 minutes, cued  for upright posture to improve vestibular/somatosensory input and limit visual fixation on feet  TREATMENT:  PATIENT EDUCATED ON FOLLOWING PROSTHETIC CARE: Other education  Skin check - Educated on regular skin check given past medical history, discussed  techniques including use of mirror to assess difficult to see areas, recommended checking every day, discussed skin changes noted in today's session  PATIENT EDUCATION: Education details: Initial HEP and discussion of amputee resources.  Encouraged pt to resume skin checks and look into resources. Person educated: Patient Education method: Explanation Education comprehension: verbalized understanding and needs further education  HOME EXERCISE PROGRAM: Access Code: [email or web address] URL: [email or web address] Date: 04/20/2023 Prepared by: Camille Bal  Exercises - Seated Ankle Inversion Eversion AROM  - 1 x daily - 5 x weekly - 2-3 sets - 10 reps - Sit to Stand with Armchair  - 1 x daily - 7 x weekly - 2 sets - 8 reps - Standing Balance in Corner with Eyes Closed  - 1 x daily - 5 x weekly - 3 sets - 10 reps - Standing March with Counter Support  - 1 x daily - 5 x weekly - 2 sets - 20 reps  ASSESSMENT:  CLINICAL IMPRESSION: Pt presents disengaged from PT session this visit.  Focus of session on addressing prosthetic management especially eliminating pistoning sound which was determined to be natural sound of prosthetic foot/ankle upon further assessment.  He continues to use heavy visual reliance for balance tasks.  PT to continue POC to promote improved mechanics of ambulation and upright safety using LRAD.  OBJECTIVE IMPAIRMENTS: Abnormal gait, decreased activity tolerance, decreased balance, decreased mobility, difficulty walking, decreased ROM, decreased strength, impaired sensation, prosthetic dependency , and pain.   ACTIVITY LIMITATIONS: carrying, squatting, transfers, and locomotion level  PARTICIPATION LIMITATIONS:  shopping, community activity, and yard work  PERSONAL FACTORS: Social background and 3+ comorbidities: see above  are also affecting patient's functional outcome.   REHAB POTENTIAL: Good  CLINICAL DECISION MAKING: Evolving/moderate complexity  EVALUATION COMPLEXITY: Moderate   GOALS: Goals reviewed with patient? Yes  SHORT TERM GOALS: Target date: 05/04/2023  Patient will demonstrate 100% compliance with initial HEP to continue to progress between physical therapy sessions.   Baseline: To be provided Goal status: INITIAL  2.  Assess ramp and write goal as indicated Baseline: Assessed 5/28 and goal written. Goal status: MET   LONG TERM GOALS: Target date: 05/25/2023  Patient will report demonstrate independence with final HEP in order to maintain current gains and continue to progress after physical therapy discharge.   Baseline:  Goal status: INITIAL  2.  Patient will improve their 5x Sit to Stand score to less than 20 seconds with minimal required UE support to demonstrate a decreased risk for falls and improved LE strength.   Baseline: 27 sec without UE support Goal status: INITIAL  3.  Patient will improve gait speed to 0.9 m/s to indicate improvement to the level of community ambulator in order to participate more easily in activities outside of the home.   Baseline: 0.8 m/s with SPC quad tip cane (SBA) Goal status: INITIAL  4.  Patient will improve TUG score to 13 seconds to indicate clinically significant progress towards a decreased risk of falls and improved mobility.  Baseline: 18.39 sec with SPC quad tip cane (SBA) Goal status: INITIAL  5.  Patient will improve ABC Score to 67% or greater to indicate a decreased risk for falls and improved self-reported confidence in balance and sense of steadiness.   Baseline: 58.1% Goal status: INITIAL  6.  Pt will ambulate up and down ramp incline no more than SBA using LRAD in order to improve unlevel surface management and  functional safety. Baseline: CGA-minA w/ SPC w/ quad  tip (5/28) Goal status: INITIAL   PLAN:  PT FREQUENCY: 2x/week  PT DURATION: 6 weeks  PLANNED INTERVENTIONS: Therapeutic exercises, Therapeutic activity, Neuromuscular re-education, Balance training, Gait training, Patient/Family education, Self Care, Joint mobilization, Manual therapy, and Re-evaluation  PLAN FOR NEXT SESSION: Did he apply for any financial assistance? Gait training w/ SPC w/ quad rubber tip-ramp/curb/stairs/unlevel ground, Add to HEP prn.  LE strengthening, stride style tasks, SLS   Sadie Haber, PT, DPT 04/22/2023, 5:07 PM

## 2023-04-27 ENCOUNTER — Ambulatory Visit: Payer: 59 | Attending: Physical Medicine and Rehabilitation | Admitting: Physical Therapy

## 2023-04-27 ENCOUNTER — Encounter: Payer: Self-pay | Admitting: Physical Therapy

## 2023-04-27 DIAGNOSIS — R296 Repeated falls: Secondary | ICD-10-CM

## 2023-04-27 DIAGNOSIS — R269 Unspecified abnormalities of gait and mobility: Secondary | ICD-10-CM | POA: Diagnosis present

## 2023-04-27 DIAGNOSIS — R2681 Unsteadiness on feet: Secondary | ICD-10-CM | POA: Diagnosis present

## 2023-04-27 NOTE — Therapy (Signed)
OUTPATIENT PHYSICAL THERAPY PROSTHETICS TREATMENT   Patient Name: [name] [name] MRN: [id number] DOB:August 04, 1971, 52 y.o., male Today's Date: 04/27/2023  PCP: [name], MD REFERRING PROVIDER: Ancil Boozer, DPM  END OF SESSION:  PT End of Session - 04/27/23 1025     Visit Number 4    Number of Visits 13    Date for PT Re-Evaluation 06/08/23    Authorization Type [employer]    Progress Note Due on Visit 10    PT Start Time 1021    PT Stop Time 1103    PT Time Calculation (min) 42 min    Equipment Utilized During Treatment Gait belt    Activity Tolerance Patient tolerated treatment well;Patient limited by pain    Behavior During Therapy Flat affect             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)    Depression    Diabetes mellitus without complication (HCC) 2019   GERD (gastroesophageal reflux disease)    Hyperlipidemia    Hypertension    Necrotizing fasciitis (HCC) 10/13/2017   Neuromuscular disorder (HCC)    neuropathy   PAD (peripheral artery disease) (HCC)    Secondary hyperparathyroidism (HCC)    Wound dehiscence 11/24/2017   Past Surgical History:  Procedure Laterality Date   AMPUTATION Right 10/13/2017   Procedure: RIGHT BELOW KNEE AMPUTATION;  Surgeon: Nadara Mustard, MD;  Location: [facility] OR;  Service: Orthopedics;  Laterality: Right;   AMPUTATION Right 11/24/2017   Procedure: AMPUTATION BELOW KNEE REVISION;  Surgeon: Nadara Mustard, MD;  Location: [facility] OR;  Service: Orthopedics;  Laterality: Right;   BIOPSY  09/02/2021   Procedure: BIOPSY;  Surgeon: Imogene Burn, MD;  Location: [facility] ENDOSCOPY;  Service: Gastroenterology;;   BIOPSY  02/18/2023   Procedure: BIOPSY;  Surgeon: Benancio Deeds, MD;  Location: [facility] ENDOSCOPY;  Service:  Gastroenterology;;   COLONOSCOPY  05/11/2019   ESOPHAGOGASTRODUODENOSCOPY (EGD) WITH PROPOFOL N/A 09/02/2021   Procedure: ESOPHAGOGASTRODUODENOSCOPY (EGD) WITH PROPOFOL;  Surgeon: Imogene Burn, MD;  Location: [facility] ENDOSCOPY;  Service: Gastroenterology;  Laterality: N/A;   ESOPHAGOGASTRODUODENOSCOPY (EGD) WITH PROPOFOL N/A 02/18/2023   Procedure: ESOPHAGOGASTRODUODENOSCOPY (EGD) WITH PROPOFOL;  Surgeon: Benancio Deeds, MD;  Location: [facility] ENDOSCOPY;  Service: Gastroenterology;  Laterality: N/A;   NO PAST SURGERIES     POLYPECTOMY     WISDOM TOOTH EXTRACTION     Patient Active Problem List   Diagnosis Date Noted   Mucosal abnormality of esophagus 02/18/2023   Foot pain, left 02/17/2023   SOB (shortness of breath) 02/16/2023   History of esophagitis 02/16/2023   Tachycardia 02/15/2023   Allergic rhinitis 11/07/2022   Healthcare maintenance 11/07/2022   Stage 5 chronic kidney disease (HCC) 09/11/2022   Tobacco dependence 09/11/2022   GERD with esophagitis 09/02/2021   AKI (acute kidney injury) (HCC) 04/10/2021   Achilles tendon contracture, left 03/03/2018   Non-pressure chronic ulcer of other part of left foot limited to breakdown of skin (HCC) 02/02/2018   History of right below knee amputation (HCC) 12/22/2017   Acute posthemorrhagic anemia    Falls 11/24/2017   Essential hypertension 11/24/2017   Normocytic normochromic anemia 11/24/2017   Uncontrolled type 2 diabetes mellitus with hyperglycemia, with long-term current use of insulin (  HCC) 10/11/2017   ARF (acute renal failure) (HCC) 10/11/2017    ONSET DATE: 04/09/2023 (referral)  REFERRING DIAG: R26.89 (ICD-10-CM) - Other abnormalities of gait and mobility  THERAPY DIAG:  Abnormality of gait and mobility  Unsteadiness on feet  Repeated falls  Rationale for Evaluation and Treatment: Rehabilitation  SUBJECTIVE:   SUBJECTIVE STATEMENT: Blood sugar is 136 prior to onset of session.  Pt continues to have left foot  pain.  He denies falls or recent near falls.  He ambulates into clinic using SPC w/ rubber quad tip.  He is wearing 5-ply sock today.  No other acute changes.  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 Yes: NPRS scale: 6/10 Pain location: L foot around where big toe used to be and back of heel Pain description: throbbing Aggravating factors: walking Relieving factors: not walking, sleeping  PRECAUTIONS: Fall  WEIGHT BEARING RESTRICTIONS: No  FALLS: Has patient fallen in last 6 months? Yes. Number of falls 5  LIVING ENVIRONMENT: Lives with: lives with their family Lives in: House/apartment Home Access: Stairs to enter Home layout: One level Stairs: Yes: External: 4 steps; can reach both Has following equipment at home: Environmental consultant - 2 wheeled, Wheelchair (manual), shower chair, Grab bars, and SPC cane with quad tip  PLOF: Independent  PATIENT GOALS: "Be able to walk without a cane and without falling and be able to walk up/down hills."  OBJECTIVE:   DIAGNOSTIC FINDINGS:   12/08/2022 DG Foot Complete: IMPRESSION: 1. New curvilinear lucencies throughout the proximal 25% of the distal phalanx of the great toe, an acute to subacute comminuted fracture with intra-articular extension. 2. Apparent chronic soft tissue ulcer at the plantar aspect of the forefoot at the level of the distal metatarsals, possibly at the great toe metatarsal head, similar to 01/18/2022. No definite cortical erosion.  COGNITION: Overall cognitive status: Within functional limits for tasks assessed   SENSATION: Decreased LE changes related to diabetic neuropathy and bilateral LE  POSTURE: rounded shoulders and forward head  LOWER EXTREMITY ROM:  Decreased ankle dorsiflexion and  hip extension noted with functional testing  LOWER EXTREMITY MMT:  MMT Right eval Left eval  Hip flexion 3/5 3+/5  Hip extension    Hip abduction    Hip adduction    Hip internal rotation    Hip external rotation    Knee flexion 3/5 3+/5  Knee extension 3/5 3+/5  Ankle dorsiflexion  3+/5  Ankle plantarflexion    Ankle inversion    Ankle eversion    (Blank rows = not tested)  BED MOBILITY:  Patient reports that he is able to do so independently  TRANSFERS: Sit to stand: SBA Stand to sit: SBA Strong reliance on arms for transfers  GAIT: Gait pattern: decreased step length- Left, decreased stance time- Right, decreased stance time- Left, decreased stride length, trendelenburg, and wide BOS Distance walked: 2 x 80 feet Assistive device utilized:  SPC with quad rubber tip Level of assistance: SBA Prosthetic pistoning during stance.   FUNCTIONAL TESTS:      CURRENT PROSTHETIC WEAR ASSESSMENT: Patient is independent with: ply sock cleaning, proper wear schedule/adjustment, and proper weight-bearing schedule/adjustment Patient is dependent with: skin check, residual limb care, and care of non-amputated limb Donning prosthesis: Complete Independence Doffing prosthesis: Complete Independence Prosthetic wear  tolerance: 10 hours/day, 7 days/week Prosthetic weight bearing tolerance: 20-30 minutes Edema: no swelling noted Residual limb condition: redness noted behind knee on R side and slight redness noted on anterior aspect of knee superior to patellar pole, on left foot slight scap noted on healing scar, patient following up with podiatry this week Prosthetic description: Unknown will follow up with [facility], socket liner poor fit with gapping along thigh K code/activity level with prosthetic use: Will clarify with [facility]  PATIENT SURVEYS:  ABC scale : 58.1% Confidence   TODAY'S TREATMENT:                                                                                                                                          -SciFit x8 minutes using BUE/BLE on level 4.0 in multi-peaks mode for aerobic challenge which seems to do better in regards to tolerance for him.  HR monitored and stable throughout with a range of 74 to 92 bpm - HR pattern reflecting goal of HIIT style workout.  -Cone weaving using SPC and CGA 4x15', pt requires cue to attempt pronating/everting left foot and ankle to assist with stability of turns as well as cane placement to prevent it becoming an obstacle -Pt maintains widened stride position performing diagonal cone transfer using trunk rotation and UE switch midway 2 rounds each foot in rear, minA needed when LLE in front, appears to exhibit ineffective left ankle strategy due to ongoing supination -SLS in // bars using BUE support 3x40 seconds each LE, quicker LLE fatigue noted via myoclonus and more R hip trendelenburg -Standing wide stance forward reach w/ mini squat using 3.3 lb ball to tap 8" cone SBA  TREATMENT:  PATIENT EDUCATED ON FOLLOWING PROSTHETIC CARE: Other education  Skin check - Educated on regular skin check given past medical history, discussed techniques including use of mirror to assess difficult to see areas, recommended checking every day, discussed skin changes noted in today's session  PATIENT EDUCATION: Education details: Continue HEP and follow-up on resource he applied for.  Emphasized benefit of obtaining financial support that could help pay for updated left orthotic insert vs orthotic shoe as this is greatly contributing to instability.  Discussed him communicating with these funding organizations about liner funding as well and that he may have some back and forth communication regarding proof of referral/receipts, etc.  Encouraged pt to resume skin checks. Person educated: Patient Education method: Explanation Education comprehension: verbalized understanding and needs further education  HOME EXERCISE  PROGRAM: Access Code: [email or web address] URL: [email or web address] Date: 04/20/2023 Prepared by: Camille Bal  Exercises - Seated Ankle Inversion Eversion AROM  - 1 x daily - 5 x weekly - 2-3 sets - 10 reps - Sit to Stand with Armchair  - 1 x daily - 7 x weekly - 2 sets - 8 reps - Standing Balance in Corner with Eyes Closed  - 1 x daily -  5 x weekly - 3 sets - 10 reps - Standing March with Counter Support  - 1 x daily - 5 x weekly - 2 sets - 20 reps  ASSESSMENT:  CLINICAL IMPRESSION: Pt presents more engaged than visit prior.  He remains most limited by left foot supination and while it appears flexible he will likely most benefit from an orthotic that can build the ground up to his foot as he has a hard time actively correcting deformity.  The limiting factor for obtaining such a orthotic insert or orthotic shoe is obtaining financial support to afford the copay.  He was challenged by unsupported stride stance today as well as reaching to the ground.  He would do well to continue working on these style tasks and others that vary his BOS and challenge proximal LE activation.   OBJECTIVE IMPAIRMENTS: Abnormal gait, decreased activity tolerance, decreased balance, decreased mobility, difficulty walking, decreased ROM, decreased strength, impaired sensation, prosthetic dependency , and pain.   ACTIVITY LIMITATIONS: carrying, squatting, transfers, and locomotion level  PARTICIPATION LIMITATIONS: shopping, community activity, and yard work  PERSONAL FACTORS: Social background and 3+ comorbidities: see above  are also affecting patient's functional outcome.   REHAB POTENTIAL: Good  CLINICAL DECISION MAKING: Evolving/moderate complexity  EVALUATION COMPLEXITY: Moderate   GOALS: Goals reviewed with patient? Yes  SHORT TERM GOALS: Target date: 05/04/2023  Patient will demonstrate 100% compliance with initial HEP to continue to progress between physical therapy sessions.    Baseline: To be provided Goal status: INITIAL  2.  Assess ramp and write goal as indicated Baseline: Assessed 5/28 and goal written. Goal status: MET   LONG TERM GOALS: Target date: 05/25/2023  Patient will report demonstrate independence with final HEP in order to maintain current gains and continue to progress after physical therapy discharge.   Baseline:  Goal status: INITIAL  2.  Patient will improve their 5x Sit to Stand score to less than 20 seconds with minimal required UE support to demonstrate a decreased risk for falls and improved LE strength.   Baseline: 27 sec without UE support Goal status: INITIAL  3.  Patient will improve gait speed to 0.9 m/s to indicate improvement to the level of community ambulator in order to participate more easily in activities outside of the home.   Baseline: 0.8 m/s with SPC quad tip cane (SBA) Goal status: INITIAL  4.  Patient will improve TUG score to 13 seconds to indicate clinically significant progress towards a decreased risk of falls and improved mobility.  Baseline: 18.39 sec with SPC quad tip cane (SBA) Goal status: INITIAL  5.  Patient will improve ABC Score to 67% or greater to indicate a decreased risk for falls and improved self-reported confidence in balance and sense of steadiness.   Baseline: 58.1% Goal status: INITIAL  6.  Pt will ambulate up and down ramp incline no more than SBA using LRAD in order to improve unlevel surface management and functional safety. Baseline: CGA-minA w/ SPC w/ quad tip (5/28) Goal status: INITIAL   PLAN:  PT FREQUENCY: 2x/week  PT DURATION: 6 weeks  PLANNED INTERVENTIONS: Therapeutic exercises, Therapeutic activity, Neuromuscular re-education, Balance training, Gait training, Patient/Family education, Self Care, Joint mobilization, Manual therapy, and Re-evaluation  PLAN FOR NEXT SESSION: Did he hear back about financial assistance-do we need to obtain referral for orthotic before  assistance approved?  Gait training w/ SPC w/ quad rubber tip-ramp/curb/stairs/unlevel ground, Add to HEP prn.  LE strengthening, REVIEW/UPDATE HEP, stride style tasks,  SLS tasks, leg press, forward reaching to floor/lifting style tasks   Sadie Haber, PT, DPT 04/27/2023, 11:04 AM

## 2023-04-28 ENCOUNTER — Other Ambulatory Visit: Payer: Self-pay | Admitting: Student

## 2023-04-29 ENCOUNTER — Ambulatory Visit: Payer: 59 | Admitting: Physical Therapy

## 2023-04-29 ENCOUNTER — Encounter: Payer: Self-pay | Admitting: Physical Therapy

## 2023-04-29 DIAGNOSIS — R296 Repeated falls: Secondary | ICD-10-CM

## 2023-04-29 DIAGNOSIS — R269 Unspecified abnormalities of gait and mobility: Secondary | ICD-10-CM

## 2023-04-29 DIAGNOSIS — R2681 Unsteadiness on feet: Secondary | ICD-10-CM

## 2023-04-29 NOTE — Therapy (Signed)
OUTPATIENT PHYSICAL THERAPY PROSTHETICS TREATMENT   Patient Name: [name] [name] MRN: [id number] DOB:1971/09/14, 52 y.o., male Today's Date: 04/29/2023  PCP: [name], MD REFERRING PROVIDER: Ancil Boozer, DPM  END OF SESSION:  PT End of Session - 04/29/23 1405     Visit Number 5    Number of Visits 13    Date for PT Re-Evaluation 06/08/23    Authorization Type [employer]    Progress Note Due on Visit 10    PT Start Time 1403    PT Stop Time 1446    PT Time Calculation (min) 43 min    Equipment Utilized During Treatment Gait belt    Activity Tolerance Patient tolerated treatment well;Patient limited by pain    Behavior During Therapy Flat affect             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)    Depression    Diabetes mellitus without complication (HCC) 2019   GERD (gastroesophageal reflux disease)    Hyperlipidemia    Hypertension    Necrotizing fasciitis (HCC) 10/13/2017   Neuromuscular disorder (HCC)    neuropathy   PAD (peripheral artery disease) (HCC)    Secondary hyperparathyroidism (HCC)    Wound dehiscence 11/24/2017   Past Surgical History:  Procedure Laterality Date   AMPUTATION Right 10/13/2017   Procedure: RIGHT BELOW KNEE AMPUTATION;  Surgeon: Nadara Mustard, MD;  Location: [facility] OR;  Service: Orthopedics;  Laterality: Right;   AMPUTATION Right 11/24/2017   Procedure: AMPUTATION BELOW KNEE REVISION;  Surgeon: Nadara Mustard, MD;  Location: [facility] OR;  Service: Orthopedics;  Laterality: Right;   BIOPSY  09/02/2021   Procedure: BIOPSY;  Surgeon: Imogene Burn, MD;  Location: [facility] ENDOSCOPY;  Service: Gastroenterology;;   BIOPSY  02/18/2023   Procedure: BIOPSY;  Surgeon: Benancio Deeds, MD;  Location: [facility] ENDOSCOPY;  Service:  Gastroenterology;;   COLONOSCOPY  05/11/2019   ESOPHAGOGASTRODUODENOSCOPY (EGD) WITH PROPOFOL N/A 09/02/2021   Procedure: ESOPHAGOGASTRODUODENOSCOPY (EGD) WITH PROPOFOL;  Surgeon: Imogene Burn, MD;  Location: [facility] ENDOSCOPY;  Service: Gastroenterology;  Laterality: N/A;   ESOPHAGOGASTRODUODENOSCOPY (EGD) WITH PROPOFOL N/A 02/18/2023   Procedure: ESOPHAGOGASTRODUODENOSCOPY (EGD) WITH PROPOFOL;  Surgeon: Benancio Deeds, MD;  Location: [facility] ENDOSCOPY;  Service: Gastroenterology;  Laterality: N/A;   NO PAST SURGERIES     POLYPECTOMY     WISDOM TOOTH EXTRACTION     Patient Active Problem List   Diagnosis Date Noted   Mucosal abnormality of esophagus 02/18/2023   Foot pain, left 02/17/2023   SOB (shortness of breath) 02/16/2023   History of esophagitis 02/16/2023   Tachycardia 02/15/2023   Allergic rhinitis 11/07/2022   Healthcare maintenance 11/07/2022   Stage 5 chronic kidney disease (HCC) 09/11/2022   Tobacco dependence 09/11/2022   GERD with esophagitis 09/02/2021   AKI (acute kidney injury) (HCC) 04/10/2021   Achilles tendon contracture, left 03/03/2018   Non-pressure chronic ulcer of other part of left foot limited to breakdown of skin (HCC) 02/02/2018   History of right below knee amputation (HCC) 12/22/2017   Acute posthemorrhagic anemia    Falls 11/24/2017   Essential hypertension 11/24/2017   Normocytic normochromic anemia 11/24/2017   Uncontrolled type 2 diabetes mellitus with hyperglycemia, with long-term current use of insulin (  HCC) 10/11/2017   ARF (acute renal failure) (HCC) 10/11/2017    ONSET DATE: 04/09/2023 (referral)  REFERRING DIAG: R26.89 (ICD-10-CM) - Other abnormalities of gait and mobility  THERAPY DIAG:  Abnormality of gait and mobility  Unsteadiness on feet  Repeated falls  Rationale for Evaluation and Treatment: Rehabilitation  SUBJECTIVE:   SUBJECTIVE STATEMENT: Blood sugar is 130 prior to onset of session.  Pt continues to have left foot  pain.  He denies falls or recent near falls.  He ambulates into clinic using SPC w/ rubber quad tip.  He is wearing 5-ply sock today.  No other acute changes.  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 Yes: NPRS scale: 5/10 Pain location: L foot around where big toe used to be and back of heel Pain description: throbbing Aggravating factors: walking Relieving factors: not walking, sleeping  PRECAUTIONS: Fall  WEIGHT BEARING RESTRICTIONS: No  FALLS: Has patient fallen in last 6 months? Yes. Number of falls 5  LIVING ENVIRONMENT: Lives with: lives with their family Lives in: House/apartment Home Access: Stairs to enter Home layout: One level Stairs: Yes: External: 4 steps; can reach both Has following equipment at home: Environmental consultant - 2 wheeled, Wheelchair (manual), shower chair, Grab bars, and SPC cane with quad tip  PLOF: Independent  PATIENT GOALS: "Be able to walk without a cane and without falling and be able to walk up/down hills."  OBJECTIVE:   DIAGNOSTIC FINDINGS:   12/08/2022 DG Foot Complete: IMPRESSION: 1. New curvilinear lucencies throughout the proximal 25% of the distal phalanx of the great toe, an acute to subacute comminuted fracture with intra-articular extension. 2. Apparent chronic soft tissue ulcer at the plantar aspect of the forefoot at the level of the distal metatarsals, possibly at the great toe metatarsal head, similar to 01/18/2022. No definite cortical erosion.  COGNITION: Overall cognitive status: Within functional limits for tasks assessed   SENSATION: Decreased LE changes related to diabetic neuropathy and bilateral LE  POSTURE: rounded shoulders and forward head  LOWER EXTREMITY ROM:  Decreased ankle dorsiflexion and  hip extension noted with functional testing  LOWER EXTREMITY MMT:  MMT Right eval Left eval  Hip flexion 3/5 3+/5  Hip extension    Hip abduction    Hip adduction    Hip internal rotation    Hip external rotation    Knee flexion 3/5 3+/5  Knee extension 3/5 3+/5  Ankle dorsiflexion  3+/5  Ankle plantarflexion    Ankle inversion    Ankle eversion    (Blank rows = not tested)  BED MOBILITY:  Patient reports that he is able to do so independently  TRANSFERS: Sit to stand: SBA Stand to sit: SBA Strong reliance on arms for transfers  GAIT: Gait pattern: decreased step length- Left, decreased stance time- Right, decreased stance time- Left, decreased stride length, trendelenburg, and wide BOS Distance walked: 2 x 80 feet Assistive device utilized:  SPC with quad rubber tip Level of assistance: SBA Prosthetic pistoning during stance.   FUNCTIONAL TESTS:      CURRENT PROSTHETIC WEAR ASSESSMENT: Patient is independent with: ply sock cleaning, proper wear schedule/adjustment, and proper weight-bearing schedule/adjustment Patient is dependent with: skin check, residual limb care, and care of non-amputated limb Donning prosthesis: Complete Independence Doffing prosthesis: Complete Independence Prosthetic wear  tolerance: 10 hours/day, 7 days/week Prosthetic weight bearing tolerance: 20-30 minutes Edema: no swelling noted Residual limb condition: redness noted behind knee on R side and slight redness noted on anterior aspect of knee superior to patellar pole, on left foot slight scap noted on healing scar, patient following up with podiatry this week Prosthetic description: Unknown will follow up with [facility], socket liner poor fit with gapping along thigh K code/activity level with prosthetic use: Will clarify with [facility]  PATIENT SURVEYS:  ABC scale : 58.1% Confidence   TODAY'S TREATMENT:                                                                                                                                          -SciFit x6 minutes using BLE only on level 5.0 in progressive peaks mode for aerobic challenge.  HR monitored and stable throughout with a range of 86 to 88 bpm - HR pattern reflecting consistent work level. -Bilateral Leg Press w/ reclined back for pt positional comfort 2x10 w/ consistent cuing to prevent left knee hyperextension, mild lightheadedness when sitting up lasting <10 seconds  Reviewed/Modified HEP: -Seated left ankle inversion/eversion x15 each direction w/ towel for visual and tactile cues and PT limited hip IR/ER compensation, limited eversion -STS w/ armchair support x10 -counter support marching x20, pt fatigued following task -Semi tandem x1 minute each foot in rear, RLE in rear is most unsteady, edu to pt to widen BOS at home if feeling too wobbly even with hand support -Corner balance normal BOS eyes closed x1.5 minutes w/ R fingertip support w/ mild-moderate sway  TREATMENT:  PATIENT EDUCATED ON FOLLOWING PROSTHETIC CARE: Other education  Skin check - Educated on regular skin check given past medical history, discussed techniques including use of mirror to assess difficult to see areas, recommended checking every day, discussed skin changes noted in today's session  PATIENT EDUCATION: Education details: Continue HEP w/ addition today and follow-up on resource he applied for.  Encouraged pt to perform or have someone else perform regular skin checks-edu on posturing of foot in combination with current skin flaking/cracking could create a friction wound in shoe. Person educated: Patient Education method: Explanation Education comprehension: verbalized understanding and needs further education  HOME EXERCISE PROGRAM: Access Code: [email or web address] URL: [email or web address] Date: 04/29/2023 Prepared by: Camille Bal  Exercises - Seated Ankle Inversion Eversion AROM  - 1 x daily - 5 x weekly - 2-3 sets - 10 reps -  Sit to Stand with Armchair  - 1 x daily - 7 x weekly - 2 sets - 8 reps - Standing Balance in Corner with Eyes Closed  - 1 x daily - 5 x weekly - 3 sets - 10 reps - Standing March with Counter Support  - 1 x daily - 5 x weekly - 2 sets - 20 reps - Semi-Tandem Balance at [employer] Open  - 1  x daily - 5 x weekly - 1 sets - 2-3 reps - 45 seconds hold  ASSESSMENT:  CLINICAL IMPRESSION: Focus of skilled session today on progression of strengthening tasks with pt tolerating leg press with modified setup due to height.  He did need frequent cuing to prevent left knee hyperextension on each repetition.  HEP was reviewed this visit with addition of semi-tandem to build upon difficulty stride tasks identified last visit.  Will continue to address reaching and stride tasks in coming visits per skilled PT POC.   OBJECTIVE IMPAIRMENTS: Abnormal gait, decreased activity tolerance, decreased balance, decreased mobility, difficulty walking, decreased ROM, decreased strength, impaired sensation, prosthetic dependency , and pain.   ACTIVITY LIMITATIONS: carrying, squatting, transfers, and locomotion level  PARTICIPATION LIMITATIONS: shopping, community activity, and yard work  PERSONAL FACTORS: Social background and 3+ comorbidities: see above  are also affecting patient's functional outcome.   REHAB POTENTIAL: Good  CLINICAL DECISION MAKING: Evolving/moderate complexity  EVALUATION COMPLEXITY: Moderate   GOALS: Goals reviewed with patient? Yes  SHORT TERM GOALS: Target date: 05/04/2023  Patient will demonstrate 100% compliance with initial HEP to continue to progress between physical therapy sessions.   Baseline: To be provided Goal status: INITIAL  2.  Assess ramp and write goal as indicated Baseline: Assessed 5/28 and goal written. Goal status: MET   LONG TERM GOALS: Target date: 05/25/2023  Patient will report demonstrate independence with final HEP in order to maintain current gains  and continue to progress after physical therapy discharge.   Baseline:  Goal status: INITIAL  2.  Patient will improve their 5x Sit to Stand score to less than 20 seconds with minimal required UE support to demonstrate a decreased risk for falls and improved LE strength.   Baseline: 27 sec without UE support Goal status: INITIAL  3.  Patient will improve gait speed to 0.9 m/s to indicate improvement to the level of community ambulator in order to participate more easily in activities outside of the home.   Baseline: 0.8 m/s with SPC quad tip cane (SBA) Goal status: INITIAL  4.  Patient will improve TUG score to 13 seconds to indicate clinically significant progress towards a decreased risk of falls and improved mobility.  Baseline: 18.39 sec with SPC quad tip cane (SBA) Goal status: INITIAL  5.  Patient will improve ABC Score to 67% or greater to indicate a decreased risk for falls and improved self-reported confidence in balance and sense of steadiness.   Baseline: 58.1% Goal status: INITIAL  6.  Pt will ambulate up and down ramp incline no more than SBA using LRAD in order to improve unlevel surface management and functional safety. Baseline: CGA-minA w/ SPC w/ quad tip (5/28) Goal status: INITIAL   PLAN:  PT FREQUENCY: 2x/week  PT DURATION: 6 weeks  PLANNED INTERVENTIONS: Therapeutic exercises, Therapeutic activity, Neuromuscular re-education, Balance training, Gait training, Patient/Family education, Self Care, Joint mobilization, Manual therapy, and Re-evaluation  PLAN FOR NEXT SESSION: Did he hear back about financial assistance-do we need to obtain referral for orthotic before assistance approved?  Gait training w/ SPC w/ quad rubber tip-ramp/curb/stairs/unlevel ground, Add to HEP prn.  LE strengthening, UPDATE STG baselines, stride style tasks, SLS tasks, leg press, forward reaching to floor/lifting style tasks   Sadie Haber, PT, DPT 04/29/2023, 2:46 PM

## 2023-04-29 NOTE — Patient Instructions (Signed)
Access Code: [email or web address] URL: [email or web address] Date: 04/29/2023 Prepared by: Camille Bal  Exercises - Seated Ankle Inversion Eversion AROM  - 1 x daily - 5 x weekly - 2-3 sets - 10 reps - Sit to Stand with Armchair  - 1 x daily - 7 x weekly - 2 sets - 8 reps - Standing Balance in Corner with Eyes Closed  - 1 x daily - 5 x weekly - 3 sets - 10 reps - Standing March with Counter Support  - 1 x daily - 5 x weekly - 2 sets - 20 reps - Semi-Tandem Balance at [employer] Eyes Open  - 1 x daily - 5 x weekly - 1 sets - 2-3 reps - 45 seconds hold

## 2023-05-04 ENCOUNTER — Encounter: Payer: Self-pay | Admitting: Gastroenterology

## 2023-05-04 ENCOUNTER — Ambulatory Visit (INDEPENDENT_AMBULATORY_CARE_PROVIDER_SITE_OTHER): Payer: 59 | Admitting: Gastroenterology

## 2023-05-04 VITALS — BP 130/68 | HR 89 | Ht 77.0 in | Wt 238.0 lb

## 2023-05-04 DIAGNOSIS — R112 Nausea with vomiting, unspecified: Secondary | ICD-10-CM | POA: Diagnosis not present

## 2023-05-04 DIAGNOSIS — D649 Anemia, unspecified: Secondary | ICD-10-CM | POA: Diagnosis not present

## 2023-05-04 NOTE — Patient Instructions (Signed)
_______________________________________________________  If your blood pressure at your visit was 140/90 or greater, please contact your primary care physician to follow up on this.  _______________________________________________________  If you are age 50 or older, your body mass index should be between 23-30. Your Body mass index is 28.22 kg/m. If this is out of the aforementioned range listed, please consider follow up with your Primary Care Provider.  If you are age 73 or younger, your body mass index should be between 19-25. Your Body mass index is 28.22 kg/m. If this is out of the aformentioned range listed, please consider follow up with your Primary Care Provider.   ________________________________________________________  The [facility] providers would like to encourage you to use [facility] to communicate with providers for non-urgent requests or questions.  Due to long hold times on the telephone, sending your provider a message by [facility] may be a faster and more efficient way to get a response.  Please allow 48 business hours for a response.  Please remember that this is for non-urgent requests.  _______________________________________________________  Follow up as needed.  It was a pleasure to see you today!  Thank you for trusting me with your gastrointestinal care!

## 2023-05-04 NOTE — Progress Notes (Addendum)
[facility] progress note:  History: [name] [name] 05/04/2023  Referring provider: [name], MD  Reason for consult/chief complaint: Anemia   Subjective  HPI: Summary of GI issues: Last office visit with Dr. Myrtie Neither October 2022 after hospitalization for protracted vomiting.  Inpatient EGD by Dr. Leonides Schanz showed severe esophagitis. 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  Insufficient response to renal dosing of metoclopramide. Inpatient colonoscopy for heme positive stool in 2020, poor prep.  Outpatient colonoscopy July 2021 (2-day prep with Suprep/MiraLAX) colon prep initially fair, improved with lavage.  3 subcentimeter tubular adenomas removed, 3-year recall recommended. He was hospitalized in March of this year for progressive dyspnea, tachycardia and acute on chronic anemia.  Dr. Lanetta Inch consult note was reviewed, anemia was felt most likely due to his CKD.  No overt GI bleeding, no stool testing for occult blood done, iron studies were normal.  Inpatient EGD by Dr. Adela Lank discovered healing of the previously discovered esophagitis, essentially no source of upper GI blood loss.  It was felt that colonoscopy would likely be of low yield regarding the anemia, and that a 2-day bowel preparation difficult for the patient to undergo.   Today, we reviewed his recent visits to the ER regarding anemia and dicussed his labs. We also reviewed his last office visit for nausea and vomiting. He states that his symptoms has improved and contributes that to changing his cigarettes brand.   He denies having any intravenous iron treatments or iron supplements but reports getting Procrit injections every 2 weeks.   He states that his diabetes is not very controlled and his kidney disease has worsened and his nephrologist is potentially considering  dialysis later on.   [name] [name] denies constipation, nausea, blood in stool, black stool, abdominal pain, bloating, unintentional weight loss, reflux, dysphagia. Fortunately, his vomiting has improved considerably since I last saw him, and he attributes this to smoking a different kind of cigarette than before.  No longer taking metoclopramide because he did not find it effective, he now takes ondansetron as needed he still has intermittent loose stool.  ROS:  Review of Systems  Constitutional:  Positive for fatigue. Negative for appetite change and fever.  HENT:  Negative for trouble swallowing.   Respiratory:  Positive for shortness of breath. Negative for cough.   Cardiovascular:  Negative for chest pain.  Gastrointestinal:  Negative for abdominal distention, abdominal pain, anal bleeding, blood in stool, constipation, diarrhea, nausea, rectal pain and vomiting.  Genitourinary:  Negative for dysuria.  Musculoskeletal:  Negative for back pain.  Skin:  Negative for rash.  Neurological:  Negative for weakness.  All other systems reviewed and are negative.    Past Medical History: 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)    Depression    Diabetes mellitus without complication (HCC) 2019   GERD (gastroesophageal reflux disease)    Hyperlipidemia    Hypertension    Necrotizing fasciitis (HCC) 10/13/2017   Neuromuscular disorder (HCC)    neuropathy   PAD (peripheral artery disease) (HCC)    Secondary hyperparathyroidism (HCC)    Wound dehiscence 11/24/2017     Past Surgical History: Past Surgical History:  Procedure Laterality Date   AMPUTATION Right 10/13/2017  Procedure: RIGHT BELOW KNEE AMPUTATION;  Surgeon: Nadara Mustard, MD;  Location: [facility] OR;  Service: Orthopedics;  Laterality: Right;    AMPUTATION Right 11/24/2017   Procedure: AMPUTATION BELOW KNEE REVISION;  Surgeon: Nadara Mustard, MD;  Location: [name] OR;  Service: Orthopedics;  Laterality: Right;   AMPUTATION TOE Left    BIOPSY  09/02/2021   Procedure: BIOPSY;  Surgeon: Imogene Burn, MD;  Location: [facility] ENDOSCOPY;  Service: Gastroenterology;;   BIOPSY  02/18/2023   Procedure: BIOPSY;  Surgeon: Benancio Deeds, MD;  Location: [name] ENDOSCOPY;  Service: Gastroenterology;;   COLONOSCOPY  05/11/2019   ESOPHAGOGASTRODUODENOSCOPY (EGD) WITH PROPOFOL N/A 09/02/2021   Procedure: ESOPHAGOGASTRODUODENOSCOPY (EGD) WITH PROPOFOL;  Surgeon: Imogene Burn, MD;  Location: [name] ENDOSCOPY;  Service: Gastroenterology;  Laterality: N/A;   ESOPHAGOGASTRODUODENOSCOPY (EGD) WITH PROPOFOL N/A 02/18/2023   Procedure: ESOPHAGOGASTRODUODENOSCOPY (EGD) WITH PROPOFOL;  Surgeon: Benancio Deeds, MD;  Location: [name] ENDOSCOPY;  Service: Gastroenterology;  Laterality: N/A;   NO PAST SURGERIES     POLYPECTOMY     WISDOM TOOTH EXTRACTION       Family History: Family History  Problem Relation Age of Onset   Diabetes Mellitus II Mother    Depression Mother    Colon polyps Mother    Hypertension Mother    High Cholesterol Mother    Heart disease Father        CABG x 4.  04/2017   Diabetes Father    High Cholesterol Father    Hypertension Father    Heart disease Maternal Grandmother    Heart disease Maternal Grandfather    Colon cancer Neg Hx    Esophageal cancer Neg Hx    Liver cancer Neg Hx    Pancreatic cancer Neg Hx    Rectal cancer Neg Hx    Stomach cancer Neg Hx     Social History: Social History   Socioeconomic History   Marital status: Divorced    Spouse name: Not on file   Number of children: 1   Years of education: 12   Highest education level: 12th grade  Occupational History   Occupation: unemployed  Tobacco Use   Smoking status: Every Day    Packs/day: 0.50    Years: 35.00    Additional pack years: 0.00    Total  pack years: 17.50    Types: Cigarettes    Start date: 11/23/1988   Smokeless tobacco: Never   Tobacco comments:    Uninterested - 0/10  importance of quitting 02/2023  Vaping Use   Vaping Use: Never used  Substance and Sexual Activity   Alcohol use: No   Drug use: No   Sexual activity: Not on file  Other Topics Concern   Not on file  Social History Narrative   Originally from  [place].   Is living with his mother, who essentially supports him now.   Social Determinants of Health   Financial Resource Strain: Medium Risk (02/21/2023)   Overall Financial Resource Strain (CARDIA)    Difficulty of Paying Living Expenses: Somewhat hard  Food Insecurity: Food Insecurity Present (02/21/2023)   Hunger Vital Sign    Worried About Running Out of Food in the Last Year: Often true    Ran Out of Food in the Last Year: Often true  Transportation Needs: No Transportation Needs (02/21/2023)   PRAPARE - Administrator, Civil Service (Medical): No    Lack of Transportation (Non-Medical): No  Physical Activity: Unknown (02/21/2023)  Exercise Vital Sign    Days of Exercise per Week: 0 days    Minutes of Exercise per Session: Not on file  Stress: Stress Concern Present (02/21/2023)   [employer] of Occupational Health - Occupational Stress Questionnaire    Feeling of Stress : To some extent  Social Connections: Unknown (02/21/2023)   Social Connection and Isolation Panel [NHANES]    Frequency of Communication with Friends and Family: More than three times a week    Frequency of Social Gatherings with Friends and Family: Once a week    Attends Religious Services: Patient declined    Database administrator or Organizations: No    Attends Engineer, structural: Not on file    Marital Status: Divorced    Allergies: Allergies  Allergen Reactions   Iron Nausea And Vomiting    With the iron tablets   Tape Rash    Rash at site of tape--okay to use paper tape     Outpatient Meds: Current Outpatient Medications  Medication Sig Dispense Refill   acetaminophen (TYLENOL) 325 MG tablet Take 2 tablets (650 mg total) by mouth every 6 (six) hours.     AGAMATRIX ULTRA-THIN LANCETS MISC Check blood sugars before meals twice daily 100 each 11   atorvastatin (LIPITOR) 40 MG tablet Take 1 tablet (40 mg total) by mouth at bedtime. 90 tablet 0   baclofen (LIORESAL) 10 MG tablet 1/2 tab twice a day (Patient taking differently: Take 10 mg by mouth daily. 1/2 tab twice a day) 30 each 0   Blood Glucose Monitoring Suppl (ACCU-CHEK GUIDE ME) w/Device KIT See admin instructions.     calcitRIOL (ROCALTROL) 0.5 MCG capsule Take 0.5 mcg by mouth in the morning and at bedtime.     diltiazem (CARDIZEM CD) 300 MG 24 hr capsule TAKE 1 CAPSULE(300 MG) BY MOUTH AT BEDTIME 90 capsule 3   FARXIGA 10 MG TABS tablet Take 10 mg by mouth daily.     furosemide (LASIX) 40 MG tablet Take 40 mg by mouth daily.     gabapentin (NEURONTIN) 300 MG capsule TAKE 1 CAPSULE(300 MG) BY MOUTH DAILY 30 capsule 0   glucose blood (AGAMATRIX PRESTO TEST) test strip Check blood sugars twice daily before meals 100 each 12   glucose blood test strip 1 each by Other route as needed for other. Use as instructed     HUMALOG KWIKPEN 100 UNIT/ML KwikPen Inject 6 Units into the skin 2 (two) times daily before lunch and supper. Dose per sliding scale 15 mL 2   hydrALAZINE (APRESOLINE) 50 MG tablet Take 50 mg by mouth in the morning and at bedtime.     insulin degludec (TRESIBA FLEXTOUCH) 100 UNIT/ML FlexTouch Pen Inject 20 Units into the skin daily. 3 mL 11   Insulin Disposable Pump (OMNIPOD DASH PODS, GEN 4,) MISC      metoprolol succinate (TOPROL XL) 50 MG 24 hr tablet Take 1 tablet (50 mg total) by mouth daily. 90 tablet 3   ondansetron (ZOFRAN-ODT) 8 MG disintegrating tablet Take 1 tablet (8 mg total) by mouth every 8 (eight) hours as needed for nausea or vomiting. 20 tablet 0   pantoprazole (PROTONIX) 40 MG  tablet Take 40 mg by mouth daily.     No current facility-administered medications for this visit.      ___________________________________________________________________ Objective   Exam:  Ht 6\' 5"  (1.956 m)   Wt 238 lb (108 kg)   BMI 28.22 kg/m  Wt Readings from  Last 3 Encounters:  05/04/23 238 lb (108 kg)  04/13/23 232 lb (105.2 kg)  04/02/23 234 lb (106.1 kg)    General: Not acutely ill-appearing.  Restricted affect.  Gets on exam table unassisted. Eyes: sclera anicteric, no redness ENT: oral mucosa moist without lesions, no cervical or supraclavicular lymphadenopathy CV: RRR, no JVD, no peripheral edema Resp: clear to auscultation bilaterally, normal RR and effort noted GI: soft, no tenderness, with active bowel sounds. No guarding or palpable organomegaly noted.  No bruit Skin; dry skin, no rash or jaundice Neuro: awake, alert and oriented x 3. Normal gross motor function and fluent speech Right BKA with prosthetic Labs:     Latest Ref Rng & Units 04/22/2023    1:00 PM 04/13/2023    4:42 PM 04/08/2023   10:30 AM  CBC  WBC 3.4 - 10.8 x10E3/uL  8.7    Hemoglobin 13.0 - 17.0 g/dL 9.6  8.2  9.6   Hematocrit 37.5 - 51.0 %  25.6    Platelets 150 - 450 x10E3/uL  206        Latest Ref Rng & Units 03/01/2023    5:22 PM 02/16/2023    2:34 AM 02/15/2023    6:08 PM  CMP  Glucose 70 - 99 mg/dL 161  096  045   BUN 6 - 24 mg/dL 56  59  56   Creatinine 0.76 - 1.27 mg/dL 4.09  8.11  9.14   Sodium 134 - 144 mmol/L 143  138  143   Potassium 3.5 - 5.2 mmol/L 5.0  3.9  3.8   Chloride 96 - 106 mmol/L 106  107  110   CO2 20 - 29 mmol/L 18  20    Calcium 8.7 - 10.2 mg/dL 8.2  8.2     Labs from [facility] 04-14-23 WBC 6.6 Albumin 3.7  Hgb 7.9 Hct 25.2 MCV 88 Plt 181 Bun 71, Creatinine 5.8 Hgb A/C 7.9  04-22-23 Hgb 9.6  04-08-23 normal iron studies Radiologic Studies:  Assessment: Nausea and vomiting, unspecified vomiting type  Normocytic anemia Fortunately, 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  I suspect it is due to somewhat better glucose control than before.  Worsening chronic normocytic anemia that I believe is from his CKD.  I have a low clinical suspicion of a colonic source of blood loss.  Although he has a history of colon polyps and was recommended to have repeat colonoscopy at a 3-year interval, I believe that presently his medical conditions preclude an outpatient colonoscopy.  He requires a difficult to do a bowel preparation, during which time he could have hypo or hyperglycemia, but more than that I am particularly concerned he could have a significant chance of volume loss and electrolyte derangement that could lead to acute renal failure. In sum, I think the risks of the preparation of procedure are greater than the likely benefits.  I explained all this to [name] [name] and a risk/benefit context.  While he does have a history of colon polyps, I believe that complications of diabetes and worsening kidney disease are more likely to cause long-term morbidity and mortality.  Plan:  I will be glad to see him again as needed.   Amada Jupiter, MD    [facility] . 35 minutes were spent on this encounter (including chart review, history/exam, counseling/coordination of care, and documentation) > 50% of that time was spent on counseling and coordination of care. Extensive chart review was required  given.  A time since I last saw [name] [name] and the complex events occurring over the last several months.  Thank you for the courtesy of this consult.  Please call me with any questions or concerns.  Charlie Pitter III  I,Safa M Kadhim,acting as a scribe for Charlie Pitter III, MD.,have documented all relevant documentation on the behalf of [name] Rist, MD,as directed by  [name] Rist, MD while in the presence of [name] Rist, MD.   [name] III, MD, have reviewed all documentation for this visit. The documentation on  05/04/23 for the exam, diagnosis, procedures, and orders are all accurate and complete.   CC: Referring provider noted above Dr. Malen Gau[name] kidney)

## 2023-05-04 NOTE — Progress Notes (Deleted)
[facility] progress note:  History: [name] [name] 05/04/2023  Referring provider: [name], MD  Reason for consult/chief complaint: Anemia   Subjective  HPI: Summary of GI issues: Last office visit with Dr. Myrtie Neither October 2022 after hospitalization for protracted vomiting.  Inpatient EGD by Dr. Leonides Schanz showed severe esophagitis. 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  Insufficient response to renal dosing of metoclopramide. Inpatient colonoscopy for heme positive stool in 2020, poor prep.  Outpatient colonoscopy July 2021 (2-day prep with Suprep/MiraLAX) colon prep initially fair, improved with lavage.  3 subcentimeter tubular adenomas removed, 3-year recall recommended. He was hospitalized in March of this year for progressive dyspnea, tachycardia and acute on chronic anemia.  Dr. Lanetta Inch consult note was reviewed, anemia was felt most likely due to his CKD.  No overt GI bleeding, no stool testing for occult blood done, iron studies were normal.  Inpatient EGD by Dr. Adela Lank discovered healing of the previously discovered esophagitis, essentially no source of upper GI blood loss.  It was felt that colonoscopy would likely be of low yield regarding the anemia, and that a 2-day bowel preparation difficult for the patient to undergo.  ***   ROS:  Review of Systems   Past Medical History: 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)    Depression    Diabetes mellitus without complication (HCC) 2019   GERD (gastroesophageal reflux disease)    Hyperlipidemia    Hypertension    Necrotizing fasciitis (HCC) 10/13/2017   Neuromuscular disorder (HCC)     neuropathy   PAD (peripheral artery disease) (HCC)    Secondary hyperparathyroidism (HCC)    Wound dehiscence 11/24/2017     Past Surgical History: Past Surgical History:  Procedure Laterality Date   AMPUTATION Right 10/13/2017   Procedure: RIGHT BELOW KNEE AMPUTATION;  Surgeon: Nadara Mustard, MD;  Location: [facility] OR;  Service: Orthopedics;  Laterality: Right;   AMPUTATION Right 11/24/2017   Procedure: AMPUTATION BELOW KNEE REVISION;  Surgeon: Nadara Mustard, MD;  Location: [facility] OR;  Service: Orthopedics;  Laterality: Right;   AMPUTATION TOE Left    BIOPSY  09/02/2021   Procedure: BIOPSY;  Surgeon: Imogene Burn, MD;  Location: [facility] ENDOSCOPY;  Service: Gastroenterology;;   BIOPSY  02/18/2023   Procedure: BIOPSY;  Surgeon: Benancio Deeds, MD;  Location: [facility] ENDOSCOPY;  Service: Gastroenterology;;   COLONOSCOPY  05/11/2019   ESOPHAGOGASTRODUODENOSCOPY (EGD) WITH PROPOFOL N/A 09/02/2021   Procedure: ESOPHAGOGASTRODUODENOSCOPY (EGD) WITH PROPOFOL;  Surgeon: Imogene Burn, MD;  Location: [facility] ENDOSCOPY;  Service: Gastroenterology;  Laterality: N/A;   ESOPHAGOGASTRODUODENOSCOPY (EGD) WITH PROPOFOL N/A 02/18/2023   Procedure: ESOPHAGOGASTRODUODENOSCOPY (EGD) WITH PROPOFOL;  Surgeon: Benancio Deeds, MD;  Location: [facility] ENDOSCOPY;  Service: Gastroenterology;  Laterality: N/A;   NO PAST SURGERIES     POLYPECTOMY     WISDOM TOOTH EXTRACTION       Family History: Family History  Problem Relation Age of Onset   Diabetes Mellitus II Mother    Depression Mother    Colon polyps Mother    Hypertension Mother    High Cholesterol Mother    Heart disease Father        CABG x 4.  04/2017  Diabetes Father    High Cholesterol Father    Hypertension Father    Heart disease Maternal Grandmother    Heart disease Maternal Grandfather    Colon cancer Neg Hx    Esophageal cancer Neg Hx    Liver cancer Neg Hx    Pancreatic cancer Neg Hx    Rectal cancer Neg Hx    Stomach cancer Neg Hx      Social History: Social History   Socioeconomic History   Marital status: Divorced    Spouse name: Not on file   Number of children: 1   Years of education: 12   Highest education level: 12th grade  Occupational History   Occupation: unemployed  Tobacco Use   Smoking status: Every Day    Packs/day: 0.50    Years: 35.00    Additional pack years: 0.00    Total pack years: 17.50    Types: Cigarettes    Start date: 11/23/1988   Smokeless tobacco: Never   Tobacco comments:    Uninterested - 0/10  importance of quitting 02/2023  Vaping Use   Vaping Use: Never used  Substance and Sexual Activity   Alcohol use: No   Drug use: No   Sexual activity: Not on file  Other Topics Concern   Not on file  Social History Narrative   Originally from  [place].   Is living with his mother, who essentially supports him now.   Social Determinants of Health   Financial Resource Strain: Medium Risk (02/21/2023)   Overall Financial Resource Strain (CARDIA)    Difficulty of Paying Living Expenses: Somewhat hard  Food Insecurity: Food Insecurity Present (02/21/2023)   Hunger Vital Sign    Worried About Running Out of Food in the Last Year: Often true    Ran Out of Food in the Last Year: Often true  Transportation Needs: No Transportation Needs (02/21/2023)   PRAPARE - Administrator, Civil Service (Medical): No    Lack of Transportation (Non-Medical): No  Physical Activity: Unknown (02/21/2023)   Exercise Vital Sign    Days of Exercise per Week: 0 days    Minutes of Exercise per Session: Not on file  Stress: Stress Concern Present (02/21/2023)   [employer] of Occupational Health - Occupational Stress Questionnaire    Feeling of Stress : To some extent  Social Connections: Unknown (02/21/2023)   Social Connection and Isolation Panel [NHANES]    Frequency of Communication with Friends and Family: More than three times a week    Frequency of Social Gatherings with Friends  and Family: Once a week    Attends Religious Services: Patient declined    Database administrator or Organizations: No    Attends Engineer, structural: Not on file    Marital Status: Divorced    Allergies: Allergies  Allergen Reactions   Iron Nausea And Vomiting    With the iron tablets   Tape Rash    Rash at site of tape--okay to use paper tape    Outpatient Meds: Current Outpatient Medications  Medication Sig Dispense Refill   acetaminophen (TYLENOL) 325 MG tablet Take 2 tablets (650 mg total) by mouth every 6 (six) hours.     AGAMATRIX ULTRA-THIN LANCETS MISC Check blood sugars before meals twice daily 100 each 11   atorvastatin (LIPITOR) 40 MG tablet Take 1 tablet (40 mg total) by mouth at bedtime. 90 tablet 0   baclofen (LIORESAL) 10 MG tablet 1/2 tab twice  a day (Patient taking differently: Take 10 mg by mouth daily. 1/2 tab twice a day) 30 each 0   Blood Glucose Monitoring Suppl (ACCU-CHEK GUIDE ME) w/Device KIT See admin instructions.     calcitRIOL (ROCALTROL) 0.5 MCG capsule Take 0.5 mcg by mouth in the morning and at bedtime.     diltiazem (CARDIZEM CD) 300 MG 24 hr capsule TAKE 1 CAPSULE(300 MG) BY MOUTH AT BEDTIME 90 capsule 3   FARXIGA 10 MG TABS tablet Take 10 mg by mouth daily.     furosemide (LASIX) 40 MG tablet Take 40 mg by mouth daily.     gabapentin (NEURONTIN) 300 MG capsule TAKE 1 CAPSULE(300 MG) BY MOUTH DAILY 30 capsule 0   glucose blood (AGAMATRIX PRESTO TEST) test strip Check blood sugars twice daily before meals 100 each 12   glucose blood test strip 1 each by Other route as needed for other. Use as instructed     HUMALOG KWIKPEN 100 UNIT/ML KwikPen Inject 6 Units into the skin 2 (two) times daily before lunch and supper. Dose per sliding scale 15 mL 2   hydrALAZINE (APRESOLINE) 50 MG tablet Take 50 mg by mouth in the morning and at bedtime.     insulin degludec (TRESIBA FLEXTOUCH) 100 UNIT/ML FlexTouch Pen Inject 20 Units into the skin daily. 3  mL 11   Insulin Disposable Pump (OMNIPOD DASH PODS, GEN 4,) MISC      metoprolol succinate (TOPROL XL) 50 MG 24 hr tablet Take 1 tablet (50 mg total) by mouth daily. 90 tablet 3   ondansetron (ZOFRAN-ODT) 8 MG disintegrating tablet Take 1 tablet (8 mg total) by mouth every 8 (eight) hours as needed for nausea or vomiting. 20 tablet 0   pantoprazole (PROTONIX) 40 MG tablet Take 40 mg by mouth daily.     No current facility-administered medications for this visit.      ___________________________________________________________________ Objective   Exam:  Ht 6\' 5"  (1.956 m)   Wt 238 lb (108 kg)   BMI 28.22 kg/m  Wt Readings from Last 3 Encounters:  05/04/23 238 lb (108 kg)  04/13/23 232 lb (105.2 kg)  04/02/23 234 lb (106.1 kg)    General: ***  Eyes: sclera anicteric, no redness ENT: oral mucosa moist without lesions, no cervical or supraclavicular lymphadenopathy CV: ***, no JVD, no peripheral edema Resp: clear to auscultation bilaterally, normal RR and effort noted GI: soft, *** tenderness, with active bowel sounds. No guarding or palpable organomegaly noted. Skin; warm and dry, no rash or jaundice noted Neuro: awake, alert and oriented x 3. Normal gross motor function and fluent speech  Labs:     Latest Ref Rng & Units 04/22/2023    1:00 PM 04/13/2023    4:42 PM 04/08/2023   10:30 AM  CBC  WBC 3.4 - 10.8 x10E3/uL  8.7    Hemoglobin 13.0 - 17.0 g/dL 9.6  8.2  9.6   Hematocrit 37.5 - 51.0 %  25.6    Platelets 150 - 450 x10E3/uL  206        Latest Ref Rng & Units 03/01/2023    5:22 PM 02/16/2023    2:34 AM 02/15/2023    6:08 PM  CMP  Glucose 70 - 99 mg/dL 161  096  045   BUN 6 - 24 mg/dL 56  59  56   Creatinine 0.76 - 1.27 mg/dL 4.09  8.11  9.14   Sodium 134 - 144 mmol/L 143  138  143  Potassium 3.5 - 5.2 mmol/L 5.0  3.9  3.8   Chloride 96 - 106 mmol/L 106  107  110   CO2 20 - 29 mmol/L 18  20    Calcium 8.7 - 10.2 mg/dL 8.2  8.2       Radiologic  Studies:  ***  Assessment: No diagnosis found.  ***  Plan:  ***  Thank you for the courtesy of this consult.  Please call me with any questions or concerns.  Charlie Pitter III  CC: Referring provider noted above

## 2023-05-05 ENCOUNTER — Encounter: Payer: Self-pay | Admitting: Physical Therapy

## 2023-05-05 ENCOUNTER — Ambulatory Visit: Payer: 59 | Admitting: Physical Therapy

## 2023-05-05 DIAGNOSIS — R269 Unspecified abnormalities of gait and mobility: Secondary | ICD-10-CM

## 2023-05-05 DIAGNOSIS — R2681 Unsteadiness on feet: Secondary | ICD-10-CM

## 2023-05-05 DIAGNOSIS — R296 Repeated falls: Secondary | ICD-10-CM

## 2023-05-05 NOTE — Therapy (Signed)
OUTPATIENT PHYSICAL THERAPY PROSTHETICS TREATMENT   Patient Name: [name] [name] MRN: [id number] DOB:[birth year], 52 y.o., male Today's Date: 05/05/2023  PCP: [name], MD REFERRING PROVIDER: Ancil Boozer, DPM  END OF SESSION:  PT End of Session - 05/05/23 1402     Visit Number 6    Number of Visits 13    Date for PT Re-Evaluation 06/08/23    Authorization Type [employer]    Progress Note Due on Visit 10    PT Start Time 1400    PT Stop Time 1444    PT Time Calculation (min) 44 min    Equipment Utilized During Treatment Gait belt    Activity Tolerance Patient tolerated treatment well;Patient limited by pain    Behavior During Therapy Flat affect             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)    Depression    Diabetes mellitus without complication (HCC) 2019   GERD (gastroesophageal reflux disease)    Hyperlipidemia    Hypertension    Necrotizing fasciitis (HCC) 10/13/2017   Neuromuscular disorder (HCC)    neuropathy   PAD (peripheral artery disease) (HCC)    Secondary hyperparathyroidism (HCC)    Wound dehiscence 11/24/2017   Past Surgical History:  Procedure Laterality Date   AMPUTATION Right 10/13/2017   Procedure: RIGHT BELOW KNEE AMPUTATION;  Surgeon: Nadara Mustard, MD;  Location: [facility] OR;  Service: Orthopedics;  Laterality: Right;   AMPUTATION Right 11/24/2017   Procedure: AMPUTATION BELOW KNEE REVISION;  Surgeon: Nadara Mustard, MD;  Location: [facility] OR;  Service: Orthopedics;  Laterality: Right;   AMPUTATION TOE Left    BIOPSY  09/02/2021   Procedure: BIOPSY;  Surgeon: Imogene Burn, MD;  Location: [facility] ENDOSCOPY;  Service: Gastroenterology;;   BIOPSY  02/18/2023   Procedure: BIOPSY;  Surgeon: Benancio Deeds, MD;  Location: [facility]  ENDOSCOPY;  Service: Gastroenterology;;   COLONOSCOPY  05/11/2019   ESOPHAGOGASTRODUODENOSCOPY (EGD) WITH PROPOFOL N/A 09/02/2021   Procedure: ESOPHAGOGASTRODUODENOSCOPY (EGD) WITH PROPOFOL;  Surgeon: Imogene Burn, MD;  Location: [facility] ENDOSCOPY;  Service: Gastroenterology;  Laterality: N/A;   ESOPHAGOGASTRODUODENOSCOPY (EGD) WITH PROPOFOL N/A 02/18/2023   Procedure: ESOPHAGOGASTRODUODENOSCOPY (EGD) WITH PROPOFOL;  Surgeon: Benancio Deeds, MD;  Location: [facility] ENDOSCOPY;  Service: Gastroenterology;  Laterality: N/A;   NO PAST SURGERIES     POLYPECTOMY     WISDOM TOOTH EXTRACTION     Patient Active Problem List   Diagnosis Date Noted   Mucosal abnormality of esophagus 02/18/2023   Foot pain, left 02/17/2023   SOB (shortness of breath) 02/16/2023   History of esophagitis 02/16/2023   Tachycardia 02/15/2023   Allergic rhinitis 11/07/2022   Healthcare maintenance 11/07/2022   Stage 5 chronic kidney disease (HCC) 09/11/2022   Tobacco dependence 09/11/2022   GERD with esophagitis 09/02/2021   AKI (acute kidney injury) (HCC) 04/10/2021   Achilles tendon contracture, left 03/03/2018   Non-pressure chronic ulcer of other part of left foot limited to breakdown of skin (HCC) 02/02/2018   History of right below knee amputation (HCC) 12/22/2017   Acute posthemorrhagic anemia    Falls 11/24/2017   Essential hypertension 11/24/2017   Normocytic normochromic anemia 11/24/2017   Uncontrolled type 2 diabetes mellitus with hyperglycemia,  with long-term current use of insulin (HCC) 10/11/2017   ARF (acute renal failure) (HCC) 10/11/2017    ONSET DATE: 04/09/2023 (referral)  REFERRING DIAG: R26.89 (ICD-10-CM) - Other abnormalities of gait and mobility  THERAPY DIAG:  Abnormality of gait and mobility  Unsteadiness on feet  Repeated falls  Rationale for Evaluation and Treatment: Rehabilitation  SUBJECTIVE:   SUBJECTIVE STATEMENT: Blood sugar is 304 prior to onset of session (assessed w/  Dexcom).  Pt states he ate lunch at 1pm.  He states his HEP is going fine.  He is wearing doubled 3-ply socks today on RLE.  He denies fall or acute changes since visit prior.  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 Yes: NPRS scale: 5/10 Pain location: L foot around where big toe used to be and back of heel Pain description: throbbing Aggravating factors: walking Relieving factors: not walking, sleeping  PRECAUTIONS: Fall  WEIGHT BEARING RESTRICTIONS: No  FALLS: Has patient fallen in last 6 months? Yes. Number of falls 5  LIVING ENVIRONMENT: Lives with: lives with their family Lives in: House/apartment Home Access: Stairs to enter Home layout: One level Stairs: Yes: External: 4 steps; can reach both Has following equipment at home: Environmental consultant - 2 wheeled, Wheelchair (manual), shower chair, Grab bars, and SPC cane with quad tip  PLOF: Independent  PATIENT GOALS: "Be able to walk without a cane and without falling and be able to walk up/down hills."  OBJECTIVE:   DIAGNOSTIC FINDINGS:   12/08/2022 DG Foot Complete: IMPRESSION: 1. New curvilinear lucencies throughout the proximal 25% of the distal phalanx of the great toe, an acute to subacute comminuted fracture with intra-articular extension. 2. Apparent chronic soft tissue ulcer at the plantar aspect of the forefoot at the level of the distal metatarsals, possibly at the great toe metatarsal head, similar to 01/18/2022. No definite cortical erosion.  COGNITION: Overall cognitive status: Within functional limits for tasks assessed   SENSATION: Decreased LE changes related to diabetic neuropathy and bilateral LE  POSTURE: rounded shoulders and forward head  LOWER EXTREMITY ROM:  Decreased ankle  dorsiflexion and hip extension noted with functional testing  LOWER EXTREMITY MMT:  MMT Right eval Left eval  Hip flexion 3/5 3+/5  Hip extension    Hip abduction    Hip adduction    Hip internal rotation    Hip external rotation    Knee flexion 3/5 3+/5  Knee extension 3/5 3+/5  Ankle dorsiflexion  3+/5  Ankle plantarflexion    Ankle inversion    Ankle eversion    (Blank rows = not tested)  BED MOBILITY:  Patient reports that he is able to do so independently  TRANSFERS: Sit to stand: SBA Stand to sit: SBA Strong reliance on arms for transfers  GAIT: Gait pattern: decreased step length- Left, decreased stance time- Right, decreased stance time- Left, decreased stride length, trendelenburg, and wide BOS Distance walked: 2 x 80 feet Assistive device utilized:  SPC with quad rubber tip Level of assistance: SBA Prosthetic pistoning during stance.   FUNCTIONAL TESTS:      CURRENT PROSTHETIC WEAR ASSESSMENT: Patient is independent with: ply sock cleaning, proper wear schedule/adjustment, and proper weight-bearing schedule/adjustment Patient is dependent with: skin check, residual limb care, and care of non-amputated limb Donning prosthesis: Complete Independence  Doffing prosthesis: Complete Independence Prosthetic wear tolerance: 10 hours/day, 7 days/week Prosthetic weight bearing tolerance: 20-30 minutes Edema: no swelling noted Residual limb condition: redness noted behind knee on R side and slight redness noted on anterior aspect of knee superior to patellar pole, on left foot slight scap noted on healing scar, patient following up with podiatry this week Prosthetic description: Unknown will follow up with [facility], socket liner poor fit with gapping along thigh K code/activity level with prosthetic use: Will clarify with [facility]  PATIENT SURVEYS:  ABC scale : 58.1% Confidence   TODAY'S TREATMENT:                                                                                                                                          Pt initiates insulin bolus of 5 units without PT prompting following education on limits of blood sugar for therapy.  He states this will likely drop his sugar to around 200, but not that quickly.  PT decides to monitor patient for 5 minutes before recheck.  Patient states that he does not bolus his insulin before he eats and sometimes has to do it after because he forgets.  PT provided education on following MD instructions for bolus prior to eating to stabilize blood sugar and prevent large swings up and down as this is hard on the body.  Pt verbalizes understanding.  Blood sugar rechecked following initial 5 minutes after bolus adjustment:  299 Reassessed x5 minutes:  296, pt is not diaphoretic or pale, he denies any changes in feeling in sitting, states he has no dizziness or other concerning symptoms.  -Standing on firm surface w/ normal BOS performing alternating reach to 8" cone in semicircle x2 each UE -Standing in wide semi-tandem on firm surface performing alternating UE tap x5 each to 8" cone oriented in front of front LE, pt has more imbalance on return to upright from cross-body reach, repeated w/ each LE in rear -Blood sugar 289 following  Attempted deadlifts w/ 2lb dowel for LE strength and posterior chain activation for static balance w/ pt going into severe kyphotic posture.  Excess time spent teaching pt hip hinge and trunk postural corrections in part whole fashion starting with shoulder and cervical posture, use of visual cues for sequencing, return demo and wall for tactile cuing for hip hinge, putting together all parts.  Pt goes into squatted posture as he fatigues.  Will return to this task in the future as pt shows improvement. -Blood sugar following:  279  -Standing on laterally oriented tilt board:  lateral taps progressing to RUE support only over 3 minutes in progressed ROM > holding level progressed to no UE  support x2 minutes w/ min sway -End of session blood sugar:  261, pt remains in stable reported condition, asymptomatic for blood sugar drop.  TREATMENT:  PATIENT EDUCATED ON FOLLOWING PROSTHETIC CARE: Other education  Skin check - Educated on regular skin check given past medical history, discussed techniques including use of mirror to assess difficult to see areas, recommended checking every day, discussed skin changes noted in today's session  PATIENT EDUCATION: Education details: Limits of blood sugar for PT (80-300).  Reinforced following MD recommendations for insulin bolus prior to eating.  Continue HEP and walking regularly w/ cane. Person educated: Patient Education method: Explanation Education comprehension: verbalized understanding and needs further education  HOME EXERCISE PROGRAM: Access Code: [email or web address] URL: [email or web address] Date: 04/29/2023 Prepared by: Camille Bal  Exercises - Seated Ankle Inversion Eversion AROM  - 1 x daily - 5 x weekly - 2-3 sets - 10 reps - Sit to Stand with Armchair  - 1 x daily - 7 x weekly - 2 sets - 8 reps - Standing Balance in Corner with Eyes Closed  - 1 x daily - 5 x weekly - 3 sets - 10 reps - Standing March with Counter Support  - 1 x daily - 5 x weekly - 2 sets - 20 reps - Semi-Tandem Balance at [employer] Eyes Open  - 1 x daily - 5 x weekly - 1 sets - 2-3 reps - 45 seconds hold  ASSESSMENT:  CLINICAL IMPRESSION: Patient required more monitoring of blood sugar with session today due to starting blood glucose >300.  He did give himself insulin as he would normally do at home and his blood sugar came down within parameters for exercise before session initiated.  Time spent working on hip hinge and progression to lifting technique using part practice with good response.  He would benefit from continued practice of this technique and higher level balance tasks.   OBJECTIVE IMPAIRMENTS: Abnormal gait, decreased  activity tolerance, decreased balance, decreased mobility, difficulty walking, decreased ROM, decreased strength, impaired sensation, prosthetic dependency , and pain.   ACTIVITY LIMITATIONS: carrying, squatting, transfers, and locomotion level  PARTICIPATION LIMITATIONS: shopping, community activity, and yard work  PERSONAL FACTORS: Social background and 3+ comorbidities: see above  are also affecting patient's functional outcome.   REHAB POTENTIAL: Good  CLINICAL DECISION MAKING: Evolving/moderate complexity  EVALUATION COMPLEXITY: Moderate   GOALS: Goals reviewed with patient? Yes  SHORT TERM GOALS: Target date: 05/04/2023  Patient will demonstrate 100% compliance with initial HEP to continue to progress between physical therapy sessions.   Baseline: Established and updated at last visit, pt reports compliance (6/12) Goal status: MET  2.  Assess ramp and write goal as indicated Baseline: Assessed 5/28 and goal written. Goal status: MET   LONG TERM GOALS: Target date: 05/25/2023  Patient will report demonstrate independence with final HEP in order to maintain current gains and continue to progress after physical therapy discharge.   Baseline:  Goal status: INITIAL  2.  Patient will improve their 5x Sit to Stand score to less than 20 seconds with minimal required UE support to demonstrate a decreased risk for falls and improved LE strength.   Baseline: 27 sec without UE support Goal status: INITIAL  3.  Patient will improve gait speed to 0.9 m/s to indicate improvement to the level of community ambulator in order to participate more easily in activities outside of the home.   Baseline: 0.8 m/s with SPC quad tip cane (SBA) Goal status: INITIAL  4.  Patient will improve TUG score to 13 seconds to indicate clinically significant progress towards a decreased risk of falls and improved mobility.  Baseline: 18.39 sec with SPC quad tip cane (SBA) Goal status:  INITIAL  5.   Patient will improve ABC Score to 67% or greater to indicate a decreased risk for falls and improved self-reported confidence in balance and sense of steadiness.   Baseline: 58.1% Goal status: INITIAL  6.  Pt will ambulate up and down ramp incline no more than SBA using LRAD in order to improve unlevel surface management and functional safety. Baseline: CGA-minA w/ SPC w/ quad tip (5/28) Goal status: INITIAL   PLAN:  PT FREQUENCY: 2x/week  PT DURATION: 6 weeks  PLANNED INTERVENTIONS: Therapeutic exercises, Therapeutic activity, Neuromuscular re-education, Balance training, Gait training, Patient/Family education, Self Care, Joint mobilization, Manual therapy, and Re-evaluation  PLAN FOR NEXT SESSION: Did he hear back about financial assistance-do we need to obtain referral for orthotic before assistance approved?  Gait training w/ SPC w/ quad rubber tip-ramp/curb/stairs/unlevel ground, Add to HEP prn.  LE strengthening, stride style tasks, SLS tasks, leg press, forward reaching to floor/lifting style tasks, progressed tilt board/airex tasks   Sadie Haber, PT, DPT 05/05/2023, 2:45 PM

## 2023-05-06 ENCOUNTER — Telehealth: Payer: Self-pay | Admitting: *Deleted

## 2023-05-06 ENCOUNTER — Ambulatory Visit (HOSPITAL_COMMUNITY)
Admission: RE | Admit: 2023-05-06 | Discharge: 2023-05-06 | Disposition: A | Discharge status: Home / Self Care | Payer: 59 | Source: Ambulatory Visit | Attending: Nephrology | Admitting: Nephrology

## 2023-05-06 VITALS — BP 137/85 | HR 82 | Temp 97.4°F | Resp 18

## 2023-05-06 DIAGNOSIS — N179 Acute kidney failure, unspecified: Secondary | ICD-10-CM | POA: Diagnosis present

## 2023-05-06 DIAGNOSIS — D649 Anemia, unspecified: Secondary | ICD-10-CM | POA: Diagnosis present

## 2023-05-06 DIAGNOSIS — D62 Acute posthemorrhagic anemia: Secondary | ICD-10-CM | POA: Diagnosis present

## 2023-05-06 LAB — POCT HEMOGLOBIN-HEMACUE: Hemoglobin: 10.4 g/dL — ABNORMAL LOW (ref 13.0–17.0)

## 2023-05-06 LAB — IRON AND TIBC
Iron: 103 ug/dL (ref 45–182)
Saturation Ratios: 31 % (ref 17.9–39.5)
TIBC: 328 ug/dL (ref 250–450)
UIBC: 225 ug/dL

## 2023-05-06 LAB — FERRITIN: Ferritin: 20 ng/mL — ABNORMAL LOW (ref 24–336)

## 2023-05-06 MED ORDER — EPOETIN ALFA-EPBX 10000 UNIT/ML IJ SOLN
20000.0000 [IU] | INTRAMUSCULAR | Status: DC
  Administered 2023-05-06: 20000 [IU] via SUBCUTANEOUS

## 2023-05-06 MED ORDER — EPOETIN ALFA-EPBX 10000 UNIT/ML IJ SOLN
INTRAMUSCULAR | Status: AC
  Filled 2023-05-06: qty 2

## 2023-05-06 NOTE — Telephone Encounter (Signed)
[name] [name], from [facility], Dr Malen Gauze is requesting to proceed with access placement, AVG or AVF as soon as possible.  I sent the instructions to our surgery scheduler, Georgianne Fick.

## 2023-05-07 ENCOUNTER — Ambulatory Visit: Payer: 59 | Admitting: Physical Therapy

## 2023-05-07 ENCOUNTER — Encounter: Payer: Self-pay | Admitting: Physical Therapy

## 2023-05-07 DIAGNOSIS — R269 Unspecified abnormalities of gait and mobility: Secondary | ICD-10-CM | POA: Diagnosis not present

## 2023-05-07 DIAGNOSIS — R2681 Unsteadiness on feet: Secondary | ICD-10-CM

## 2023-05-07 DIAGNOSIS — R296 Repeated falls: Secondary | ICD-10-CM

## 2023-05-07 NOTE — Therapy (Unsigned)
OUTPATIENT PHYSICAL THERAPY PROSTHETICS TREATMENT   Patient Name: [name] [name] MRN: [id number] DOB:March 04, 1971, 52 y.o., male Today's Date: 05/07/2023  PCP: [name], MD REFERRING PROVIDER: Ancil Boozer, DPM  END OF SESSION:  PT End of Session - 05/07/23 1322     Visit Number 7    Number of Visits 13    Date for PT Re-Evaluation 06/08/23    Authorization Type [employer]    Progress Note Due on Visit 10    PT Start Time 1319    PT Stop Time 1358    PT Time Calculation (min) 39 min    Equipment Utilized During Treatment Gait belt    Activity Tolerance Patient tolerated treatment well;Patient limited by pain    Behavior During Therapy Flat affect             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)    Depression    Diabetes mellitus without complication (HCC) 2019   GERD (gastroesophageal reflux disease)    Hyperlipidemia    Hypertension    Necrotizing fasciitis (HCC) 10/13/2017   Neuromuscular disorder (HCC)    neuropathy   PAD (peripheral artery disease) (HCC)    Secondary hyperparathyroidism (HCC)    Wound dehiscence 11/24/2017   Past Surgical History:  Procedure Laterality Date   AMPUTATION Right 10/13/2017   Procedure: RIGHT BELOW KNEE AMPUTATION;  Surgeon: Nadara Mustard, MD;  Location: [facility] OR;  Service: Orthopedics;  Laterality: Right;   AMPUTATION Right 11/24/2017   Procedure: AMPUTATION BELOW KNEE REVISION;  Surgeon: Nadara Mustard, MD;  Location: [facility] OR;  Service: Orthopedics;  Laterality: Right;   AMPUTATION TOE Left    BIOPSY  09/02/2021   Procedure: BIOPSY;  Surgeon: Imogene Burn, MD;  Location: [facility] ENDOSCOPY;  Service: Gastroenterology;;   BIOPSY  02/18/2023   Procedure: BIOPSY;  Surgeon: Benancio Deeds, MD;  Location: [facility]  ENDOSCOPY;  Service: Gastroenterology;;   COLONOSCOPY  05/11/2019   ESOPHAGOGASTRODUODENOSCOPY (EGD) WITH PROPOFOL N/A 09/02/2021   Procedure: ESOPHAGOGASTRODUODENOSCOPY (EGD) WITH PROPOFOL;  Surgeon: Imogene Burn, MD;  Location: [facility] ENDOSCOPY;  Service: Gastroenterology;  Laterality: N/A;   ESOPHAGOGASTRODUODENOSCOPY (EGD) WITH PROPOFOL N/A 02/18/2023   Procedure: ESOPHAGOGASTRODUODENOSCOPY (EGD) WITH PROPOFOL;  Surgeon: Benancio Deeds, MD;  Location: [facility] ENDOSCOPY;  Service: Gastroenterology;  Laterality: N/A;   NO PAST SURGERIES     POLYPECTOMY     WISDOM TOOTH EXTRACTION     Patient Active Problem List   Diagnosis Date Noted   Mucosal abnormality of esophagus 02/18/2023   Foot pain, left 02/17/2023   SOB (shortness of breath) 02/16/2023   History of esophagitis 02/16/2023   Tachycardia 02/15/2023   Allergic rhinitis 11/07/2022   Healthcare maintenance 11/07/2022   Stage 5 chronic kidney disease (HCC) 09/11/2022   Tobacco dependence 09/11/2022   GERD with esophagitis 09/02/2021   AKI (acute kidney injury) (HCC) 04/10/2021   Achilles tendon contracture, left 03/03/2018   Non-pressure chronic ulcer of other part of left foot limited to breakdown of skin (HCC) 02/02/2018   History of right below knee amputation (HCC) 12/22/2017   Acute posthemorrhagic anemia    Falls 11/24/2017   Essential hypertension 11/24/2017   Normocytic normochromic anemia 11/24/2017   Uncontrolled type 2 diabetes mellitus with hyperglycemia,  with long-term current use of insulin (HCC) 10/11/2017   ARF (acute renal failure) (HCC) 10/11/2017    ONSET DATE: 04/09/2023 (referral)  REFERRING DIAG: R26.89 (ICD-10-CM) - Other abnormalities of gait and mobility  THERAPY DIAG:  Abnormality of gait and mobility  Unsteadiness on feet  Repeated falls  Rationale for Evaluation and Treatment: Rehabilitation  SUBJECTIVE:   SUBJECTIVE STATEMENT: Blood sugar is 146 prior to onset of session (assessed w/  Dexcom).  He is wearing doubled 3-ply socks today on RLE.  He denies fall or acute changes since visit prior.  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 Yes: NPRS scale: 5/10 Pain location: L foot around where big toe used to be and back of heel Pain description: throbbing Aggravating factors: walking Relieving factors: not walking, sleeping  PRECAUTIONS: Fall  WEIGHT BEARING RESTRICTIONS: No  FALLS: Has patient fallen in last 6 months? Yes. Number of falls 5  LIVING ENVIRONMENT: Lives with: lives with their family Lives in: House/apartment Home Access: Stairs to enter Home layout: One level Stairs: Yes: External: 4 steps; can reach both Has following equipment at home: Environmental consultant - 2 wheeled, Wheelchair (manual), shower chair, Grab bars, and SPC cane with quad tip  PLOF: Independent  PATIENT GOALS: "Be able to walk without a cane and without falling and be able to walk up/down hills."  OBJECTIVE:   DIAGNOSTIC FINDINGS:   12/08/2022 DG Foot Complete: IMPRESSION: 1. New curvilinear lucencies throughout the proximal 25% of the distal phalanx of the great toe, an acute to subacute comminuted fracture with intra-articular extension. 2. Apparent chronic soft tissue ulcer at the plantar aspect of the forefoot at the level of the distal metatarsals, possibly at the great toe metatarsal head, similar to 01/18/2022. No definite cortical erosion.  COGNITION: Overall cognitive status: Within functional limits for tasks assessed   SENSATION: Decreased LE changes related to diabetic neuropathy and bilateral LE  POSTURE: rounded shoulders and forward head  LOWER EXTREMITY ROM:  Decreased ankle dorsiflexion and hip extension noted with functional  testing  LOWER EXTREMITY MMT:  MMT Right eval Left eval  Hip flexion 3/5 3+/5  Hip extension    Hip abduction    Hip adduction    Hip internal rotation    Hip external rotation    Knee flexion 3/5 3+/5  Knee extension 3/5 3+/5  Ankle dorsiflexion  3+/5  Ankle plantarflexion    Ankle inversion    Ankle eversion    (Blank rows = not tested)  BED MOBILITY:  Patient reports that he is able to do so independently  TRANSFERS: Sit to stand: SBA Stand to sit: SBA Strong reliance on arms for transfers  GAIT: Gait pattern: decreased step length- Left, decreased stance time- Right, decreased stance time- Left, decreased stride length, trendelenburg, and wide BOS Distance walked: 2 x 80 feet Assistive device utilized:  SPC with quad rubber tip Level of assistance: SBA Prosthetic pistoning during stance.   FUNCTIONAL TESTS:      CURRENT PROSTHETIC WEAR ASSESSMENT: Patient is independent with: ply sock cleaning, proper wear schedule/adjustment, and proper weight-bearing schedule/adjustment Patient is dependent with: skin check, residual limb care, and care of non-amputated limb Donning prosthesis: Complete Independence Doffing prosthesis: Complete Independence Prosthetic wear tolerance: 10 hours/day, 7 days/week Prosthetic weight bearing tolerance: 20-30  minutes Edema: no swelling noted Residual limb condition: redness noted behind knee on R side and slight redness noted on anterior aspect of knee superior to patellar pole, on left foot slight scap noted on healing scar, patient following up with podiatry this week Prosthetic description: Unknown will follow up with [facility], socket liner poor fit with gapping along thigh K code/activity level with prosthetic use: Will clarify with [facility]  PATIENT SURVEYS:  ABC scale : 58.1% Confidence   TODAY'S TREATMENT:                                                                                                                                          THEREX: -SciFit x8 minutes in sprints mode for dynamic cardiovascular warmup and aerobic HIIT style workout on level 5.0 using BUE/BLE.  RPE ***/10 following task. -Bilateral leg press x15 at 50 lbs > 60 lbs 2x8; pt does not provide feedback when asked by therapist about needed adjustments other than "I don't know"  NMR: In // bars: -Airex alternating staggered stance progressing to no UE support and CGA-SBA over 4 rounds of variable times on, repeated on each LE; edu on using left foot contact with the ground to improve ankle strategy -Anteriorly oriented tilt board holding level progressing to no UE support > head turns holding board level CGA-minA due to sway, repeated several rounds for improvement > alternating LE cone taps w/ BUE support and SBA  TREATMENT:  PATIENT EDUCATED ON FOLLOWING PROSTHETIC CARE: Other education  Skin check - Educated on regular skin check given past medical history, discussed techniques including use of mirror to assess difficult to see areas, recommended checking every day, discussed skin changes noted in today's session  PATIENT EDUCATION: Education details:  Continue HEP and walking regularly w/ cane.  Discussed skin integrity of LLE and skin checks/keeping skin clean and moisturized-did recommend checking with PCP about precautions for what he uses on skin and to avoid fragrances or potentially irritating soaps/lotions.  Pt is not performing skin checks and PT was concerned due to redness between cracks on skin on front of shin that were not present at session prior and this was shared with patient.  Encouraged him to follow-up with Amputee resources provided prior. Person educated: Patient Education method: Explanation Education comprehension: verbalized understanding and needs further education  HOME EXERCISE PROGRAM: Access Code: [email or web address] URL: [email or web address] Date: 04/29/2023 Prepared by: Camille Bal  Exercises -  Seated Ankle Inversion Eversion AROM  - 1 x daily - 5 x weekly - 2-3 sets - 10 reps - Sit to Stand with Armchair  - 1 x daily - 7 x weekly - 2 sets - 8 reps - Standing Balance in Corner with Eyes Closed  - 1 x daily - 5 x weekly - 3 sets - 10 reps - Standing March with Counter Support  - 1 x daily - 5 x weekly -  2 sets - 20 reps - Semi-Tandem Balance at [employer] Eyes Open  - 1 x daily - 5 x weekly - 1 sets - 2-3 reps - 45 seconds hold  ASSESSMENT:  CLINICAL IMPRESSION: Patient required more monitoring of blood sugar with session today due to starting blood glucose >300.  He did give himself insulin as he would normally do at home and his blood sugar came down within parameters for exercise before session initiated.  Time spent working on hip hinge and progression to lifting technique using part practice with good response.  He would benefit from continued practice of this technique and higher level balance tasks.   OBJECTIVE IMPAIRMENTS: Abnormal gait, decreased activity tolerance, decreased balance, decreased mobility, difficulty walking, decreased ROM, decreased strength, impaired sensation, prosthetic dependency , and pain.   ACTIVITY LIMITATIONS: carrying, squatting, transfers, and locomotion level  PARTICIPATION LIMITATIONS: shopping, community activity, and yard work  PERSONAL FACTORS: Social background and 3+ comorbidities: see above  are also affecting patient's functional outcome.   REHAB POTENTIAL: Good  CLINICAL DECISION MAKING: Evolving/moderate complexity  EVALUATION COMPLEXITY: Moderate   GOALS: Goals reviewed with patient? Yes  SHORT TERM GOALS: Target date: 05/04/2023  Patient will demonstrate 100% compliance with initial HEP to continue to progress between physical therapy sessions.   Baseline: Established and updated at last visit, pt reports compliance (6/12) Goal status: MET  2.  Assess ramp and write goal as indicated Baseline: Assessed 5/28 and goal  written. Goal status: MET   LONG TERM GOALS: Target date: 05/25/2023  Patient will report demonstrate independence with final HEP in order to maintain current gains and continue to progress after physical therapy discharge.   Baseline:  Goal status: INITIAL  2.  Patient will improve their 5x Sit to Stand score to less than 20 seconds with minimal required UE support to demonstrate a decreased risk for falls and improved LE strength.   Baseline: 27 sec without UE support Goal status: INITIAL  3.  Patient will improve gait speed to 0.9 m/s to indicate improvement to the level of community ambulator in order to participate more easily in activities outside of the home.   Baseline: 0.8 m/s with SPC quad tip cane (SBA) Goal status: INITIAL  4.  Patient will improve TUG score to 13 seconds to indicate clinically significant progress towards a decreased risk of falls and improved mobility.  Baseline: 18.39 sec with SPC quad tip cane (SBA) Goal status: INITIAL  5.  Patient will improve ABC Score to 67% or greater to indicate a decreased risk for falls and improved self-reported confidence in balance and sense of steadiness.   Baseline: 58.1% Goal status: INITIAL  6.  Pt will ambulate up and down ramp incline no more than SBA using LRAD in order to improve unlevel surface management and functional safety. Baseline: CGA-minA w/ SPC w/ quad tip (5/28) Goal status: INITIAL   PLAN:  PT FREQUENCY: 2x/week  PT DURATION: 6 weeks  PLANNED INTERVENTIONS: Therapeutic exercises, Therapeutic activity, Neuromuscular re-education, Balance training, Gait training, Patient/Family education, Self Care, Joint mobilization, Manual therapy, and Re-evaluation  PLAN FOR NEXT SESSION: Did he hear back about financial assistance-do we need to obtain referral for orthotic before assistance approved?  Gait training w/ SPC w/ quad rubber tip-ramp/curb/stairs/unlevel ground, Add to HEP prn.  LE strengthening, stride  style tasks, SLS tasks, leg press, forward reaching to floor/lifting style tasks, progressed tilt board/airex tasks   Sadie Haber, PT, DPT 05/07/2023, 2:01 PM

## 2023-05-10 ENCOUNTER — Encounter: Payer: Self-pay | Admitting: Physical Therapy

## 2023-05-10 ENCOUNTER — Ambulatory Visit: Payer: 59 | Admitting: Physical Therapy

## 2023-05-10 DIAGNOSIS — R2681 Unsteadiness on feet: Secondary | ICD-10-CM

## 2023-05-10 DIAGNOSIS — R269 Unspecified abnormalities of gait and mobility: Secondary | ICD-10-CM | POA: Diagnosis not present

## 2023-05-10 DIAGNOSIS — R296 Repeated falls: Secondary | ICD-10-CM

## 2023-05-10 NOTE — Therapy (Unsigned)
OUTPATIENT PHYSICAL THERAPY PROSTHETICS TREATMENT   Patient Name: [name] [name] MRN: [id number] DOB:17-Jul-1971, 52 y.o., male Today's Date: 05/12/2023  PCP: [name], MD REFERRING PROVIDER: Ancil Boozer, DPM  END OF SESSION:  PT End of Session - 05/10/23 1411     Visit Number 8    Number of Visits 13    Date for PT Re-Evaluation 06/08/23    Authorization Type [employer]    Progress Note Due on Visit 10    PT Start Time 1404    PT Stop Time 1445    PT Time Calculation (min) 41 min    Equipment Utilized During Treatment Gait belt    Activity Tolerance Patient tolerated treatment well;Patient limited by pain    Behavior During Therapy Flat affect             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)    Depression    Diabetes mellitus without complication (HCC) 2019   GERD (gastroesophageal reflux disease)    Hyperlipidemia    Hypertension    Necrotizing fasciitis (HCC) 10/13/2017   Neuromuscular disorder (HCC)    neuropathy   PAD (peripheral artery disease) (HCC)    Secondary hyperparathyroidism (HCC)    Wound dehiscence 11/24/2017   Past Surgical History:  Procedure Laterality Date   AMPUTATION Right 10/13/2017   Procedure: RIGHT BELOW KNEE AMPUTATION;  Surgeon: Nadara Mustard, MD;  Location: [facility] OR;  Service: Orthopedics;  Laterality: Right;   AMPUTATION Right 11/24/2017   Procedure: AMPUTATION BELOW KNEE REVISION;  Surgeon: Nadara Mustard, MD;  Location: [facility] OR;  Service: Orthopedics;  Laterality: Right;   AMPUTATION TOE Left    BIOPSY  09/02/2021   Procedure: BIOPSY;  Surgeon: Imogene Burn, MD;  Location: [facility] ENDOSCOPY;  Service: Gastroenterology;;   BIOPSY  02/18/2023   Procedure: BIOPSY;  Surgeon: Benancio Deeds, MD;  Location: [facility]  ENDOSCOPY;  Service: Gastroenterology;;   COLONOSCOPY  05/11/2019   ESOPHAGOGASTRODUODENOSCOPY (EGD) WITH PROPOFOL N/A 09/02/2021   Procedure: ESOPHAGOGASTRODUODENOSCOPY (EGD) WITH PROPOFOL;  Surgeon: Imogene Burn, MD;  Location: [facility] ENDOSCOPY;  Service: Gastroenterology;  Laterality: N/A;   ESOPHAGOGASTRODUODENOSCOPY (EGD) WITH PROPOFOL N/A 02/18/2023   Procedure: ESOPHAGOGASTRODUODENOSCOPY (EGD) WITH PROPOFOL;  Surgeon: Benancio Deeds, MD;  Location: [facility] ENDOSCOPY;  Service: Gastroenterology;  Laterality: N/A;   NO PAST SURGERIES     POLYPECTOMY     WISDOM TOOTH EXTRACTION     Patient Active Problem List   Diagnosis Date Noted   Mucosal abnormality of esophagus 02/18/2023   Foot pain, left 02/17/2023   SOB (shortness of breath) 02/16/2023   History of esophagitis 02/16/2023   Tachycardia 02/15/2023   Allergic rhinitis 11/07/2022   Healthcare maintenance 11/07/2022   Stage 5 chronic kidney disease (HCC) 09/11/2022   Tobacco dependence 09/11/2022   GERD with esophagitis 09/02/2021   AKI (acute kidney injury) (HCC) 04/10/2021   Achilles tendon contracture, left 03/03/2018   Non-pressure chronic ulcer of other part of left foot limited to breakdown of skin (HCC) 02/02/2018   History of right below knee amputation (HCC) 12/22/2017   Acute posthemorrhagic anemia    Falls 11/24/2017   Essential hypertension 11/24/2017   Normocytic normochromic anemia 11/24/2017   Uncontrolled type 2 diabetes mellitus with hyperglycemia,  with long-term current use of insulin (HCC) 10/11/2017   ARF (acute renal failure) (HCC) 10/11/2017    ONSET DATE: 04/09/2023 (referral)  REFERRING DIAG: R26.89 (ICD-10-CM) - Other abnormalities of gait and mobility  THERAPY DIAG:  Abnormality of gait and mobility  Unsteadiness on feet  Repeated falls  Rationale for Evaluation and Treatment: Rehabilitation  SUBJECTIVE:   SUBJECTIVE STATEMENT: Blood sugar is 205 prior to onset of session (assessed w/  Dexcom).  He is wearing doubled 3-ply socks today on RLE.  He denies fall or acute changes since visit prior.  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 Yes: NPRS scale: 5/10 Pain location: L foot around where big toe used to be and back of heel Pain description: throbbing Aggravating factors: walking Relieving factors: not walking, sleeping  PRECAUTIONS: Fall  WEIGHT BEARING RESTRICTIONS: No  FALLS: Has patient fallen in last 6 months? Yes. Number of falls 5  LIVING ENVIRONMENT: Lives with: lives with their family Lives in: House/apartment Home Access: Stairs to enter Home layout: One level Stairs: Yes: External: 4 steps; can reach both Has following equipment at home: Environmental consultant - 2 wheeled, Wheelchair (manual), shower chair, Grab bars, and SPC cane with quad tip  PLOF: Independent  PATIENT GOALS: "Be able to walk without a cane and without falling and be able to walk up/down hills."  OBJECTIVE:   DIAGNOSTIC FINDINGS:   12/08/2022 DG Foot Complete: IMPRESSION: 1. New curvilinear lucencies throughout the proximal 25% of the distal phalanx of the great toe, an acute to subacute comminuted fracture with intra-articular extension. 2. Apparent chronic soft tissue ulcer at the plantar aspect of the forefoot at the level of the distal metatarsals, possibly at the great toe metatarsal head, similar to 01/18/2022. No definite cortical erosion.  COGNITION: Overall cognitive status: Within functional limits for tasks assessed   SENSATION: Decreased LE changes related to diabetic neuropathy and bilateral LE  POSTURE: rounded shoulders and forward head  LOWER EXTREMITY ROM:  Decreased ankle dorsiflexion and hip extension noted with functional  testing  LOWER EXTREMITY MMT:  MMT Right eval Left eval  Hip flexion 3/5 3+/5  Hip extension    Hip abduction    Hip adduction    Hip internal rotation    Hip external rotation    Knee flexion 3/5 3+/5  Knee extension 3/5 3+/5  Ankle dorsiflexion  3+/5  Ankle plantarflexion    Ankle inversion    Ankle eversion    (Blank rows = not tested)  BED MOBILITY:  Patient reports that he is able to do so independently  TRANSFERS: Sit to stand: SBA Stand to sit: SBA Strong reliance on arms for transfers  GAIT: Gait pattern: decreased step length- Left, decreased stance time- Right, decreased stance time- Left, decreased stride length, trendelenburg, and wide BOS Distance walked: 2 x 80 feet Assistive device utilized:  SPC with quad rubber tip Level of assistance: SBA Prosthetic pistoning during stance.   FUNCTIONAL TESTS:      CURRENT PROSTHETIC WEAR ASSESSMENT: Patient is independent with: ply sock cleaning, proper wear schedule/adjustment, and proper weight-bearing schedule/adjustment Patient is dependent with: skin check, residual limb care, and care of non-amputated limb Donning prosthesis: Complete Independence Doffing prosthesis: Complete Independence Prosthetic wear tolerance: 10 hours/day, 7 days/week Prosthetic weight bearing tolerance: 20-30  minutes Edema: no swelling noted Residual limb condition: redness noted behind knee on R side and slight redness noted on anterior aspect of knee superior to patellar pole, on left foot slight scap noted on healing scar, patient following up with podiatry this week Prosthetic description: Unknown will follow up with [facility], socket liner poor fit with gapping along thigh K code/activity level with prosthetic use: Will clarify with [facility]  PATIENT SURVEYS:  ABC scale : 58.1% Confidence   TODAY'S TREATMENT:                                                                                                                                          THEREX: -SciFit x8 minutes in sprints mode for dynamic cardiovascular warmup and aerobic HIIT style workout on level 5.0 using BUE/BLE.  RPE "I don't know"/10 following task. -STS from elevated mat surface to bring pt knees to 90 degrees, hands-on-knees, 2x10  NMR: At countertop: -4" forward advance retreat progressed from LUE to 2 fingertip support w/ mild left knee pain on retreat part of task, alternating LE for several reps -4" step overs laterally w/ BUE support (attempted unilateral support w/ residual limb pain from increased weight bearing), several reps performed -Forward 4-8" hurdles x6 bouts w/ countertop support and use of SPC, unsuccessful attempts at progressing to cane only due to significant sway attempting SLS for contralateral limb advancement -Standing in wide semi-tandem performing crossbody below waist reach to retrieve bean bags and toss to far midline target, CGA due to sway on return to upright, switched LE in rear midway through task  TREATMENT:  PATIENT EDUCATED ON FOLLOWING PROSTHETIC CARE: Other education  Skin check - Educated on regular skin check given past medical history, discussed techniques including use of mirror to assess difficult to see areas, recommended checking every day, discussed skin changes noted in today's session  PATIENT EDUCATION: Education details:  Continue HEP and walking regularly w/ cane.  Encouraged him to apply for further amputee resources provided prior. Person educated: Patient Education method: Explanation Education comprehension: verbalized understanding and needs further education  HOME EXERCISE PROGRAM: Access Code: [email or web address] URL: [email or web address] Date: 04/29/2023 Prepared by: Camille Bal  Exercises - Seated Ankle Inversion Eversion AROM  - 1 x daily - 5 x weekly - 2-3 sets - 10 reps - Sit to Stand with Armchair  - 1 x daily - 7 x weekly - 2 sets - 8 reps - Standing Balance in Corner  with Eyes Closed  - 1 x daily - 5 x weekly - 3 sets - 10 reps - Standing March with Counter Support  - 1 x daily - 5 x weekly - 2 sets - 20 reps - Semi-Tandem Balance at [employer] Eyes Open  - 1 x daily - 5 x weekly - 1 sets - 2-3 reps - 45 seconds hold  ASSESSMENT:  CLINICAL IMPRESSION: Focus on functional  strengthening and various balance strategies incorporating weight shifting and SLS.  He remains challenged by left lower extremity stance more than right likely due to need for specialized orthotic and chronic foot posturing following partial amputation.  At current, cane is likely safest long-term AD option for patient if financially unable to obtain orthotic, new liner, or other medical devices.  Will continue to address dynamic balance to improve safety with upright mobility.   OBJECTIVE IMPAIRMENTS: Abnormal gait, decreased activity tolerance, decreased balance, decreased mobility, difficulty walking, decreased ROM, decreased strength, impaired sensation, prosthetic dependency , and pain.   ACTIVITY LIMITATIONS: carrying, squatting, transfers, and locomotion level  PARTICIPATION LIMITATIONS: shopping, community activity, and yard work  PERSONAL FACTORS: Social background and 3+ comorbidities: see above  are also affecting patient's functional outcome.   REHAB POTENTIAL: Good  CLINICAL DECISION MAKING: Evolving/moderate complexity  EVALUATION COMPLEXITY: Moderate   GOALS: Goals reviewed with patient? Yes  SHORT TERM GOALS: Target date: 05/04/2023  Patient will demonstrate 100% compliance with initial HEP to continue to progress between physical therapy sessions.   Baseline: Established and updated at last visit, pt reports compliance (6/12) Goal status: MET  2.  Assess ramp and write goal as indicated Baseline: Assessed 5/28 and goal written. Goal status: MET   LONG TERM GOALS: Target date: 05/25/2023  Patient will report demonstrate independence with final HEP in order  to maintain current gains and continue to progress after physical therapy discharge.   Baseline:  Goal status: INITIAL  2.  Patient will improve their 5x Sit to Stand score to less than 20 seconds with minimal required UE support to demonstrate a decreased risk for falls and improved LE strength.   Baseline: 27 sec without UE support Goal status: INITIAL  3.  Patient will improve gait speed to 0.9 m/s to indicate improvement to the level of community ambulator in order to participate more easily in activities outside of the home.   Baseline: 0.8 m/s with SPC quad tip cane (SBA) Goal status: INITIAL  4.  Patient will improve TUG score to 13 seconds to indicate clinically significant progress towards a decreased risk of falls and improved mobility.  Baseline: 18.39 sec with SPC quad tip cane (SBA) Goal status: INITIAL  5.  Patient will improve ABC Score to 67% or greater to indicate a decreased risk for falls and improved self-reported confidence in balance and sense of steadiness.   Baseline: 58.1% Goal status: INITIAL  6.  Pt will ambulate up and down ramp incline no more than SBA using LRAD in order to improve unlevel surface management and functional safety. Baseline: CGA-minA w/ SPC w/ quad tip (5/28) Goal status: INITIAL   PLAN:  PT FREQUENCY: 2x/week  PT DURATION: 6 weeks  PLANNED INTERVENTIONS: Therapeutic exercises, Therapeutic activity, Neuromuscular re-education, Balance training, Gait training, Patient/Family education, Self Care, Joint mobilization, Manual therapy, and Re-evaluation  PLAN FOR NEXT SESSION: Did he hear back about financial assistance-do we need to obtain referral for orthotic before assistance approved?  Gait training w/ SPC w/ quad rubber tip-ramp/curb/stairs/unlevel ground, Add to HEP prn.  LE strengthening, stride style tasks, SLS tasks - gumdrop taps, leg press, forward reaching to floor/lifting style tasks, blue mat compliant surface tasks, blaze pods,  glut/quad strengthening   Sadie Haber, PT, DPT 05/12/2023, 8:59 AM

## 2023-05-12 ENCOUNTER — Encounter: Payer: Self-pay | Admitting: Physical Therapy

## 2023-05-12 ENCOUNTER — Ambulatory Visit: Payer: 59 | Admitting: Physical Therapy

## 2023-05-12 DIAGNOSIS — R269 Unspecified abnormalities of gait and mobility: Secondary | ICD-10-CM | POA: Diagnosis not present

## 2023-05-12 DIAGNOSIS — R296 Repeated falls: Secondary | ICD-10-CM

## 2023-05-12 DIAGNOSIS — R2681 Unsteadiness on feet: Secondary | ICD-10-CM

## 2023-05-12 NOTE — Therapy (Signed)
OUTPATIENT PHYSICAL THERAPY PROSTHETICS TREATMENT   Patient Name: [name] [name] MRN: [id number] DOB:July 03, 1971, 52 y.o., male Today's Date: 05/12/2023  PCP: [name], MD REFERRING PROVIDER: Ancil Boozer, DPM  END OF SESSION:  PT End of Session - 05/12/23 1412     Visit Number 9    Number of Visits 13    Date for PT Re-Evaluation 06/08/23    Authorization Type [employer]    Progress Note Due on Visit 10    PT Start Time 1406    PT Stop Time 1445    PT Time Calculation (min) 39 min    Equipment Utilized During Treatment Gait belt    Activity Tolerance Patient tolerated treatment well;Patient limited by pain    Behavior During Therapy Flat affect            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)    Depression    Diabetes mellitus without complication (HCC) 2019   GERD (gastroesophageal reflux disease)    Hyperlipidemia    Hypertension    Necrotizing fasciitis (HCC) 10/13/2017   Neuromuscular disorder (HCC)    neuropathy   PAD (peripheral artery disease) (HCC)    Secondary hyperparathyroidism (HCC)    Wound dehiscence 11/24/2017   Past Surgical History:  Procedure Laterality Date   AMPUTATION Right 10/13/2017   Procedure: RIGHT BELOW KNEE AMPUTATION;  Surgeon: Nadara Mustard, MD;  Location: [facility] OR;  Service: Orthopedics;  Laterality: Right;   AMPUTATION Right 11/24/2017   Procedure: AMPUTATION BELOW KNEE REVISION;  Surgeon: Nadara Mustard, MD;  Location: [facility] OR;  Service: Orthopedics;  Laterality: Right;   AMPUTATION TOE Left    BIOPSY  09/02/2021   Procedure: BIOPSY;  Surgeon: Imogene Burn, MD;  Location: [facility] ENDOSCOPY;  Service: Gastroenterology;;   BIOPSY  02/18/2023   Procedure: BIOPSY;  Surgeon: Benancio Deeds, MD;  Location: [facility]  ENDOSCOPY;  Service: Gastroenterology;;   COLONOSCOPY  05/11/2019   ESOPHAGOGASTRODUODENOSCOPY (EGD) WITH PROPOFOL N/A 09/02/2021   Procedure: ESOPHAGOGASTRODUODENOSCOPY (EGD) WITH PROPOFOL;  Surgeon: Imogene Burn, MD;  Location: [facility] ENDOSCOPY;  Service: Gastroenterology;  Laterality: N/A;   ESOPHAGOGASTRODUODENOSCOPY (EGD) WITH PROPOFOL N/A 02/18/2023   Procedure: ESOPHAGOGASTRODUODENOSCOPY (EGD) WITH PROPOFOL;  Surgeon: Benancio Deeds, MD;  Location: [facility] ENDOSCOPY;  Service: Gastroenterology;  Laterality: N/A;   NO PAST SURGERIES     POLYPECTOMY     WISDOM TOOTH EXTRACTION     Patient Active Problem List   Diagnosis Date Noted   Mucosal abnormality of esophagus 02/18/2023   Foot pain, left 02/17/2023   SOB (shortness of breath) 02/16/2023   History of esophagitis 02/16/2023   Tachycardia 02/15/2023   Allergic rhinitis 11/07/2022   Healthcare maintenance 11/07/2022   Stage 5 chronic kidney disease (HCC) 09/11/2022   Tobacco dependence 09/11/2022   GERD with esophagitis 09/02/2021   AKI (acute kidney injury) (HCC) 04/10/2021   Achilles tendon contracture, left 03/03/2018   Non-pressure chronic ulcer of other part of left foot limited to breakdown of skin (HCC) 02/02/2018   History of right below knee amputation (HCC) 12/22/2017   Acute posthemorrhagic anemia    Falls 11/24/2017   Essential hypertension 11/24/2017   Normocytic normochromic anemia 11/24/2017   Uncontrolled type 2 diabetes mellitus with hyperglycemia, with  long-term current use of insulin (HCC) 10/11/2017   ARF (acute renal failure) (HCC) 10/11/2017    ONSET DATE: 04/09/2023 (referral)  REFERRING DIAG: R26.89 (ICD-10-CM) - Other abnormalities of gait and mobility  THERAPY DIAG:  Abnormality of gait and mobility  Unsteadiness on feet  Repeated falls  Rationale for Evaluation and Treatment: Rehabilitation  SUBJECTIVE:   SUBJECTIVE STATEMENT: Blood sugar is 283 prior to onset of session (assessed w/  Dexcom).  He is wearing doubled 3-ply socks today on RLE.  He denies falls or acute changes since visit prior.  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 Yes: NPRS scale: 5/10 Pain location: L foot around where big toe used to be and back of heel Pain description: throbbing Aggravating factors: walking Relieving factors: not walking, sleeping  PRECAUTIONS: Fall  WEIGHT BEARING RESTRICTIONS: No  FALLS: Has patient fallen in last 6 months? Yes. Number of falls 5  LIVING ENVIRONMENT: Lives with: lives with their family Lives in: House/apartment Home Access: Stairs to enter Home layout: One level Stairs: Yes: External: 4 steps; can reach both Has following equipment at home: Environmental consultant - 2 wheeled, Wheelchair (manual), shower chair, Grab bars, and SPC cane with quad tip  PLOF: Independent  PATIENT GOALS: "Be able to walk without a cane and without falling and be able to walk up/down hills."  OBJECTIVE:   DIAGNOSTIC FINDINGS:   12/08/2022 DG Foot Complete: IMPRESSION: 1. New curvilinear lucencies throughout the proximal 25% of the distal phalanx of the great toe, an acute to subacute comminuted fracture with intra-articular extension. 2. Apparent chronic soft tissue ulcer at the plantar aspect of the forefoot at the level of the distal metatarsals, possibly at the great toe metatarsal head, similar to 01/18/2022. No definite cortical erosion.  COGNITION: Overall cognitive status: Within functional limits for tasks assessed   SENSATION: Decreased LE changes related to diabetic neuropathy and bilateral LE  POSTURE: rounded shoulders and forward head  LOWER EXTREMITY ROM:  Decreased ankle dorsiflexion and hip extension noted with functional  testing  LOWER EXTREMITY MMT:  MMT Right eval Left eval  Hip flexion 3/5 3+/5  Hip extension    Hip abduction    Hip adduction    Hip internal rotation    Hip external rotation    Knee flexion 3/5 3+/5  Knee extension 3/5 3+/5  Ankle dorsiflexion  3+/5  Ankle plantarflexion    Ankle inversion    Ankle eversion    (Blank rows = not tested)  BED MOBILITY:  Patient reports that he is able to do so independently  TRANSFERS: Sit to stand: SBA Stand to sit: SBA Strong reliance on arms for transfers  GAIT: Gait pattern: decreased step length- Left, decreased stance time- Right, decreased stance time- Left, decreased stride length, trendelenburg, and wide BOS Distance walked: 2 x 80 feet Assistive device utilized:  SPC with quad rubber tip Level of assistance: SBA Prosthetic pistoning during stance.   FUNCTIONAL TESTS:      CURRENT PROSTHETIC WEAR ASSESSMENT: Patient is independent with: ply sock cleaning, proper wear schedule/adjustment, and proper weight-bearing schedule/adjustment Patient is dependent with: skin check, residual limb care, and care of non-amputated limb Donning prosthesis: Complete Independence Doffing prosthesis: Complete Independence Prosthetic wear tolerance: 10 hours/day, 7 days/week Prosthetic weight bearing tolerance: 20-30 minutes  Edema: no swelling noted Residual limb condition: redness noted behind knee on R side and slight redness noted on anterior aspect of knee superior to patellar pole, on left foot slight scap noted on healing scar, patient following up with podiatry this week Prosthetic description: Unknown will follow up with [facility], socket liner poor fit with gapping along thigh K code/activity level with prosthetic use: Will clarify with [facility]  PATIENT SURVEYS:  ABC scale : 58.1% Confidence   TODAY'S TREATMENT:                                                                                                                                          6 Blaze pods on random one color taps setting for improved reaction time and dynamic SLS.  Performed on 1 minute intervals with 30 second rest periods.  Pt requires SBA-CGA guarding. Round 1:  Firm surface setup w/ lateral stepping to tap floor level pods and reaching overhead and laterally outside BOS.  9 hits. Round 2:  Firm surface setup w/ lateral stepping w/ overhead and outside BOS reaching and stepping on floor level pods setup.  18 hits. Round 3:  Firm surface setup w/ lateral stepping w/ overhead and outside BOS reaching and stepping on floor level pods setup.  19 hits. Notable errors/deficits:  Intermittent BUE support - primarily with posterior LOB when stepping on pods. 6 Blaze pods on random one color taps setting for improved ankle strategy and dynamic SLS.  Performed on 1 minute intervals with 30 second rest periods.  Pt requires SBA guarding. Round 1:  Blue mat compliant surface setup w/ lateral stepping to tap floor level pods and reaching overhead and laterally outside BOS.  19 hits. Round 2:  Blue mat compliant surface setup w/ lateral stepping to tap floor level pods and reaching overhead and laterally outside BOS setup.  25 hits. Round 3:  Blue mat compliant surface setup w/ lateral stepping to tap floor level pods and reaching overhead and laterally outside BOS setup.  24 hits. Notable errors/deficits:  Pt notably fatigued requiring more frequent UE support w/o LOB.  In // bars: -Anteriorly oriented tilt board performing anterior and posterior taps progressing to RUE support only over 5 minutes with increased whole body shaking w/ decreased UE support. -Step up with contralateral hip march x10 each LE, increased left foot pain following task per report, minimally resolves toward baseline with rest over 2 minutes.  TREATMENT (This is re-iterated every session):  PATIENT EDUCATED ON FOLLOWING PROSTHETIC CARE: Other education  Skin check - Educated on regular skin  check given past medical history, discussed techniques including use of mirror to assess difficult to see areas, recommended checking every day, discussed skin changes noted in today's session  PATIENT EDUCATION: Education details:  Encouraged pt to verbalize change in symptoms during session due to elevated blood sugar just within parameters for therapy.  Continue HEP and walking regularly w/ cane.  Encouraged him to apply for further amputee resources provided prior.  Encouraged pt to speak with MD about further pain management opportunities as he continues to have significant left foot pain before and following activities and PT feels this is contributing to imbalance.  He states no interventions are helping this at home. Person educated: Patient Education method: Explanation Education comprehension: verbalized understanding and needs further education  HOME EXERCISE PROGRAM: Access Code: [email or web address] URL: [email or web address] Date: 04/29/2023 Prepared by: Camille Bal  Exercises - Seated Ankle Inversion Eversion AROM  - 1 x daily - 5 x weekly - 2-3 sets - 10 reps - Sit to Stand with Armchair  - 1 x daily - 7 x weekly - 2 sets - 8 reps - Standing Balance in Corner with Eyes Closed  - 1 x daily - 5 x weekly - 3 sets - 10 reps - Standing March with Counter Support  - 1 x daily - 5 x weekly - 2 sets - 20 reps - Semi-Tandem Balance at [employer] Eyes Open  - 1 x daily - 5 x weekly - 1 sets - 2-3 reps - 45 seconds hold  ASSESSMENT:  CLINICAL IMPRESSION: Emphasis of skilled session on dynamic mobility and SLS tolerance.  He is challenges by blaze pod task particularly by surface change to incorporate more compliance for further ankle stability challenge on the LLE.  SLS continues to cause increased fatigue and worsening residual limb pain despite sock adjustment.  Will continue to address this deficit and others per skilled PT POC in coming sessions.   OBJECTIVE IMPAIRMENTS:  Abnormal gait, decreased activity tolerance, decreased balance, decreased mobility, difficulty walking, decreased ROM, decreased strength, impaired sensation, prosthetic dependency , and pain.   ACTIVITY LIMITATIONS: carrying, squatting, transfers, and locomotion level  PARTICIPATION LIMITATIONS: shopping, community activity, and yard work  PERSONAL FACTORS: Social background and 3+ comorbidities: see above  are also affecting patient's functional outcome.   REHAB POTENTIAL: Good  CLINICAL DECISION MAKING: Evolving/moderate complexity  EVALUATION COMPLEXITY: Moderate   GOALS: Goals reviewed with patient? Yes  SHORT TERM GOALS: Target date: 05/04/2023  Patient will demonstrate 100% compliance with initial HEP to continue to progress between physical therapy sessions.   Baseline: Established and updated at last visit, pt reports compliance (6/12) Goal status: MET  2.  Assess ramp and write goal as indicated Baseline: Assessed 5/28 and goal written. Goal status: MET   LONG TERM GOALS: Target date: 05/25/2023  Patient will report demonstrate independence with final HEP in order to maintain current gains and continue to progress after physical therapy discharge.   Baseline:  Goal status: INITIAL  2.  Patient will improve their 5x Sit to Stand score to less than 20 seconds with minimal required UE support to demonstrate a decreased risk for falls and improved LE strength.   Baseline: 27 sec without UE support Goal status: INITIAL  3.  Patient will improve gait speed to 0.9 m/s to indicate improvement to the level of community ambulator in order to participate more easily in activities outside of the home.   Baseline: 0.8 m/s with SPC quad tip cane (SBA) Goal status: INITIAL  4.  Patient will improve TUG score to 13 seconds to indicate clinically significant progress towards a decreased risk of falls and improved mobility.  Baseline: 18.39 sec with SPC quad tip cane (SBA) Goal  status: INITIAL  5.  Patient will improve ABC Score to 67% or greater to  indicate a decreased risk for falls and improved self-reported confidence in balance and sense of steadiness.   Baseline: 58.1% Goal status: INITIAL  6.  Pt will ambulate up and down ramp incline no more than SBA using LRAD in order to improve unlevel surface management and functional safety. Baseline: CGA-minA w/ SPC w/ quad tip (5/28) Goal status: INITIAL   PLAN:  PT FREQUENCY: 2x/week  PT DURATION: 6 weeks  PLANNED INTERVENTIONS: Therapeutic exercises, Therapeutic activity, Neuromuscular re-education, Balance training, Gait training, Patient/Family education, Self Care, Joint mobilization, Manual therapy, and Re-evaluation  PLAN FOR NEXT SESSION: Did he hear back about financial assistance?  Gait training w/ SPC w/ quad rubber tip-ramp/curb/stairs/unlevel ground, Add to HEP prn.  LE strengthening, SLS tasks - gumdrop taps, leg press, forward reaching to floor/lifting style tasks, blue mat compliant surface tasks, glut/quad strengthening, blue/green dynadisc-lunges/free standing   Sadie Haber, PT, DPT 05/12/2023, 2:47 PM

## 2023-05-17 NOTE — Progress Notes (Signed)
SUBJECTIVE:   Chief compliant/HPI: annual examination  [name] [name] is a 52 y.o. who presents today for an annual exam.   Hx of colonic polyps  Anemia - colonoscopy-due soon-was seen by gastroenterology 05/04/23 and was discussed that he would be too high risk given possibility of electrolyte derangement and volume loss with the procedure.   Foot-ever since surgery on it has been throbbing on left toe-sharp removal of sharp  Healing well, no fevers or vomiting. Does have podiatrist wo intermittently debrides this area. Sharp pain- on gabapentin 300 daily.   Smoking 1/2 pack a day 31 years, not interested in cutting back or quitting.  He gravitates towards cigarettes due to his "nerves" we discussed options for medications that could help with his mood as well as smoking but he declines this today.  He says that he will think about it. No alcohol no other drugs  Hypertension-takes hydralazine 50 mg twice a day, metoprolol 50 mg daily and diltiazem 300 mg daily BP at home usually runs 120/80   CKD V Also taking another binder medication per patient that was recently prescribed by nephrologist He is awaiting vascular surgery appointment for AV fistula creation  Diabetes-follows with endocrinology doctor-does have an OmniPod-has been low at 62 this a.m., in review of his Omni bed in the office I see that his readings show that he is 82% in range, 4% lows omnipod, no very lows   OBJECTIVE:   BP (!) 155/89   Pulse (!) 101   Ht 6\' 5"  (1.956 m)   Wt 236 lb 9.6 oz (107.3 kg)   SpO2 100%   BMI 28.06 kg/m   General: NAD, awake, alert, responsive to questions Head: Normocephalic atraumatic CV: Regular rate and rhythm no murmurs rubs or gallops Respiratory: Clear to ausculation bilaterally, no wheezes rales or crackles, chest rises symmetrically,  no increased work of breathing Abdomen: Soft, non-tender, non-distended, normoactive bowel sounds  Extremities: R AKA with  prosthesis, L foot it great toe amputation, small pit on underside of foot-no erythema, swelling or drainage  ASSESSMENT/PLAN:   Essential hypertension Uncontrolled today.  Goal would probably be lower given his CKD and around 130 systolic.  Currently on hydralazine, diltiazem and metoprolol.  He states his blood pressures at home are very well-controlled. -Discussed keeping blood pressure log for 2 weeks and sending me a MyChart picture with readings -Continue medications currently -RFP  Uncontrolled type 2 diabetes mellitus with hyperglycemia, with long-term current use of insulin (HCC) A1c 6.6 today.  OmniPod reviewed and does not have any very lows.  He follows closely with endocrinology and has an appointment in August. -Continue current regimen -Let us know if having any more lows on his regimen  Non-pressure chronic ulcer of other part of left foot limited to breakdown of skin (HCC) Discussed to follow-up with podiatry closely to ensure continued debridement of this area.  Discussed that he does not have any systemic symptoms of fevers/vomiting.  Watching this closely given his diabetes status.  Not very much room for gabapentin titration given CKD status.  Tobacco dependence He is not interested in quitting today.  We discussed the options of cessation with him.  Normocytic normochromic anemia He is too high risk for colonoscopy per GI.  Likely in the setting of chronic kidney disease.  May require iron supplementation at some point. -CBC    Annual Examination  See AVS for age appropriate recommendations     05/18/2023    2:24  PM 03/01/2023    2:25 PM 02/22/2023    2:18 PM  Depression screen PHQ 2/9  Decreased Interest 2 2 1   Down, Depressed, Hopeless 2 1 1   PHQ - 2 Score 4 3 2   Altered sleeping 3 3 1   Tired, decreased energy 3 3 2   Change in appetite 2 1 1   Feeling bad or failure about yourself  2 1 0  Trouble concentrating 1 1 0  Moving slowly or fidgety/restless  0 0   Suicidal thoughts  0 0  PHQ-9 Score 15 12 6   Difficult doing work/chores  Very difficult Somewhat difficult   Discussed, declines intervention BP reviewed and not at goal-discussed above.   Considered the following items based upon USPSTF recommendations: Diabetes screening: ordered. 6.6 at goal Screening for elevated cholesterol: ordered for next visit Hepatitis C: ordered  Colorectal cancer screening:  recently saw GI-risks outweigh benefit currently for colonoscopy Lung cancer screening:  he has 15.5 pack year smoking history, no screening yet, discussed cessation in depth today .   Hep C, lipid panel  Follow up in 1 year or sooner if indicated.    [name], MD [facility]

## 2023-05-18 ENCOUNTER — Encounter: Payer: Self-pay | Admitting: Student

## 2023-05-18 ENCOUNTER — Ambulatory Visit (INDEPENDENT_AMBULATORY_CARE_PROVIDER_SITE_OTHER): Payer: 59 | Admitting: Student

## 2023-05-18 VITALS — BP 155/89 | HR 101 | Ht 77.0 in | Wt 236.6 lb

## 2023-05-18 DIAGNOSIS — L97521 Non-pressure chronic ulcer of other part of left foot limited to breakdown of skin: Secondary | ICD-10-CM

## 2023-05-18 DIAGNOSIS — F17200 Nicotine dependence, unspecified, uncomplicated: Secondary | ICD-10-CM

## 2023-05-18 DIAGNOSIS — Z794 Long term (current) use of insulin: Secondary | ICD-10-CM | POA: Diagnosis not present

## 2023-05-18 DIAGNOSIS — N185 Chronic kidney disease, stage 5: Secondary | ICD-10-CM

## 2023-05-18 DIAGNOSIS — E1165 Type 2 diabetes mellitus with hyperglycemia: Secondary | ICD-10-CM | POA: Diagnosis not present

## 2023-05-18 DIAGNOSIS — I10 Essential (primary) hypertension: Secondary | ICD-10-CM | POA: Diagnosis not present

## 2023-05-18 DIAGNOSIS — D649 Anemia, unspecified: Secondary | ICD-10-CM

## 2023-05-18 LAB — POCT GLYCOSYLATED HEMOGLOBIN (HGB A1C): HbA1c, POC (controlled diabetic range): 6.6 % (ref 0.0–7.0)

## 2023-05-18 MED ORDER — FARXIGA 10 MG PO TABS: 10.0000 mg | ORAL_TABLET | Freq: Every day | ORAL | 0 refills | Status: DC

## 2023-05-18 NOTE — Assessment & Plan Note (Signed)
A1c 6.6 today.  OmniPod reviewed and does not have any very lows.  He follows closely with endocrinology and has an appointment in August. -Continue current regimen -Let us know if having any more lows on his regimen

## 2023-05-18 NOTE — Assessment & Plan Note (Signed)
He is too high risk for colonoscopy per GI.  Likely in the setting of chronic kidney disease.  May require iron supplementation at some point. -CBC

## 2023-05-18 NOTE — Assessment & Plan Note (Signed)
He is not interested in quitting today.  We discussed the options of cessation with him.

## 2023-05-18 NOTE — Assessment & Plan Note (Signed)
Uncontrolled today.  Goal would probably be lower given his CKD and around 130 systolic.  Currently on hydralazine, diltiazem and metoprolol.  He states his blood pressures at home are very well-controlled. -Discussed keeping blood pressure log for 2 weeks and sending me a MyChart picture with readings -Continue medications currently -RFP

## 2023-05-18 NOTE — Patient Instructions (Signed)
It was great to see you! Thank you for allowing me to participate in your care!   I recommend that you always bring your medications to each appointment as this makes it easy to ensure we are on the correct medications and helps [place] not miss when refills are needed.  Our plans for today:  - We can get a lipid panel (fasting) set up a lab visit for this - Please let me know if you are interested in any medication for smoking or your nerves - Please schedule your medicare annual wellness visit - Please keep a log of your blood pressures for 2 weeks AM and PM and send me a mychart message of what these show - Please schedule appointment with podiatrist to look at your foot  We are checking some labs today, I will call you if they are abnormal will send you a MyChart message or a letter if they are normal.  If you do not hear about your labs in the next 2 weeks please let us know.  Take care and seek immediate care sooner if you develop any concerns. Please remember to show up 15 minutes before your scheduled appointment time!  [name], MD [facility]   Blood Pressure Record Sheet To take your blood pressure, you will need a blood pressure machine. You can buy a blood pressure machine (blood pressure monitor) at your clinic, drug store, or online. When choosing one, consider: An automatic monitor that has an arm cuff. A cuff that wraps snugly around your upper arm. You should be able to fit only one finger between your arm and the cuff. A device that stores blood pressure reading results. Do not choose a monitor that measures your blood pressure from your wrist or finger. Follow your health care provider's instructions for how to take your blood pressure. To use this form: Take your blood pressure medications every day These measurements should be taken when you have been at rest for at least 10-15 min Take at least 2 readings with each blood pressure check. This makes sure the  results are correct. Wait 1-2 minutes between measurements. Write down the results in the spaces on this form. Keep in mind it should always be recorded systolic over diastolic. Both numbers are important.  Repeat this every day for 2-3 weeks, or as told by your health care provider.  Make a follow-up appointment with your health care provider to discuss the results.  Blood Pressure Log Date Medications taken? (Y/N) Blood Pressure Time of Day

## 2023-05-18 NOTE — Assessment & Plan Note (Addendum)
Discussed to follow-up with podiatry closely to ensure continued debridement of this area.  Discussed that he does not have any systemic symptoms of fevers/vomiting.  Watching this closely given his diabetes status.  Not very much room for gabapentin titration given CKD status.

## 2023-05-19 ENCOUNTER — Encounter: Payer: Self-pay | Admitting: Physical Therapy

## 2023-05-19 ENCOUNTER — Ambulatory Visit: Payer: 59 | Admitting: Physical Therapy

## 2023-05-19 DIAGNOSIS — R2681 Unsteadiness on feet: Secondary | ICD-10-CM

## 2023-05-19 DIAGNOSIS — R296 Repeated falls: Secondary | ICD-10-CM

## 2023-05-19 DIAGNOSIS — R269 Unspecified abnormalities of gait and mobility: Secondary | ICD-10-CM | POA: Diagnosis not present

## 2023-05-19 LAB — CBC
Hematocrit: 31.1 % — ABNORMAL LOW (ref 37.5–51.0)
Hemoglobin: 9.8 g/dL — ABNORMAL LOW (ref 13.0–17.7)
MCH: 28.7 pg (ref 26.6–33.0)
MCHC: 31.5 g/dL (ref 31.5–35.7)
MCV: 91 fL (ref 79–97)
Platelets: 159 10*3/uL (ref 150–450)
RBC: 3.41 x10E6/uL — ABNORMAL LOW (ref 4.14–5.80)
RDW: 13.9 % (ref 11.6–15.4)
WBC: 6.3 10*3/uL (ref 3.4–10.8)

## 2023-05-19 LAB — RENAL FUNCTION PANEL
Albumin: 3.8 g/dL — ABNORMAL LOW (ref 4.1–5.1)
BUN/Creatinine Ratio: 15 (ref 9–20)
BUN: 83 mg/dL (ref 6–24)
CO2: 17 mmol/L — ABNORMAL LOW (ref 20–29)
Calcium: 8.4 mg/dL — ABNORMAL LOW (ref 8.7–10.2)
Chloride: 107 mmol/L — ABNORMAL HIGH (ref 96–106)
Creatinine, Ser: 5.51 mg/dL — ABNORMAL HIGH (ref 0.76–1.27)
Glucose: 261 mg/dL — ABNORMAL HIGH (ref 70–99)
Phosphorus: 5.9 mg/dL — ABNORMAL HIGH (ref 2.8–4.1)
Potassium: 5.1 mmol/L (ref 3.5–5.2)
Sodium: 140 mmol/L (ref 134–144)
eGFR: 12 mL/min/{1.73_m2} — ABNORMAL LOW (ref >59)

## 2023-05-19 LAB — HCV INTERPRETATION

## 2023-05-19 LAB — HCV AB W REFLEX TO QUANT PCR: HCV Ab: NONREACTIVE

## 2023-05-19 NOTE — Therapy (Unsigned)
OUTPATIENT PHYSICAL THERAPY PROSTHETICS TREATMENT-PROGRESS NOTE   Patient Name: [name] [name] MRN: [id number] DOB:23-Feb-1971, 52 y.o., male Today's Date: 05/19/2023  PCP: [name], MD REFERRING PROVIDER: Ancil Boozer, DPM  PT progress note for Bethann Berkshire.  Reporting period 04/13/2023 to 05/19/2023  See Note below for Objective Data and Assessment of Progress/Goals  Thank you for the referral of this patient. Camille Bal, PT, DPT  END OF SESSION:  PT End of Session - 05/19/23 1408     Visit Number 10    Number of Visits 13    Date for PT Re-Evaluation 06/08/23    Authorization Type [employer]    Progress Note Due on Visit 10    PT Start Time 1402    PT Stop Time 1445    PT Time Calculation (min) 43 min    Equipment Utilized During Treatment Gait belt    Activity Tolerance Patient tolerated treatment well;Patient limited by pain    Behavior During Therapy Flat affect            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)    Depression    Diabetes mellitus without complication (HCC) 2019   GERD (gastroesophageal reflux disease)    Hyperlipidemia    Hypertension    Necrotizing fasciitis (HCC) 10/13/2017   Neuromuscular disorder (HCC)    neuropathy   PAD (peripheral artery disease) (HCC)    Secondary hyperparathyroidism (HCC)    Wound dehiscence 11/24/2017   Past Surgical History:  Procedure Laterality Date   AMPUTATION Right 10/13/2017   Procedure: RIGHT BELOW KNEE AMPUTATION;  Surgeon: Nadara Mustard, MD;  Location: [facility] OR;  Service: Orthopedics;  Laterality: Right;   AMPUTATION Right 11/24/2017   Procedure: AMPUTATION BELOW KNEE REVISION;  Surgeon: Nadara Mustard, MD;  Location: [facility] OR;  Service: Orthopedics;  Laterality: Right;    AMPUTATION TOE Left    BIOPSY  09/02/2021   Procedure: BIOPSY;  Surgeon: Imogene Burn, MD;  Location: [facility] ENDOSCOPY;  Service: Gastroenterology;;   BIOPSY  02/18/2023   Procedure: BIOPSY;  Surgeon: Benancio Deeds, MD;  Location: [facility] ENDOSCOPY;  Service: Gastroenterology;;   COLONOSCOPY  05/11/2019   ESOPHAGOGASTRODUODENOSCOPY (EGD) WITH PROPOFOL N/A 09/02/2021   Procedure: ESOPHAGOGASTRODUODENOSCOPY (EGD) WITH PROPOFOL;  Surgeon: Imogene Burn, MD;  Location: [facility] ENDOSCOPY;  Service: Gastroenterology;  Laterality: N/A;   ESOPHAGOGASTRODUODENOSCOPY (EGD) WITH PROPOFOL N/A 02/18/2023   Procedure: ESOPHAGOGASTRODUODENOSCOPY (EGD) WITH PROPOFOL;  Surgeon: Benancio Deeds, MD;  Location: [facility] ENDOSCOPY;  Service: Gastroenterology;  Laterality: N/A;   NO PAST SURGERIES     POLYPECTOMY     WISDOM TOOTH EXTRACTION     Patient Active Problem List   Diagnosis Date Noted   Mucosal abnormality of esophagus 02/18/2023   Foot pain, left 02/17/2023   SOB (shortness of breath) 02/16/2023   History of esophagitis 02/16/2023   Tachycardia 02/15/2023   Allergic rhinitis 11/07/2022   Healthcare maintenance 11/07/2022   Stage 5 chronic kidney disease (HCC) 09/11/2022   Tobacco dependence 09/11/2022   GERD with esophagitis 09/02/2021   AKI (acute kidney injury) (HCC) 04/10/2021   Achilles tendon contracture, left 03/03/2018   Non-pressure chronic ulcer of other part of left foot limited to breakdown of skin (HCC) 02/02/2018   History  of right below knee amputation (HCC) 12/22/2017   Acute posthemorrhagic anemia    Falls 11/24/2017   Essential hypertension 11/24/2017   Normocytic normochromic anemia 11/24/2017   Uncontrolled type 2 diabetes mellitus with hyperglycemia, with long-term current use of insulin (HCC) 10/11/2017   ARF (acute renal failure) (HCC) 10/11/2017    ONSET DATE: 04/09/2023 (referral)  REFERRING DIAG: R26.89 (ICD-10-CM) - Other abnormalities of gait and  mobility  THERAPY DIAG:  Abnormality of gait and mobility  Unsteadiness on feet  Repeated falls  Rationale for Evaluation and Treatment: Rehabilitation  SUBJECTIVE:   SUBJECTIVE STATEMENT: He states his kidney doctor changed a medication last week but he cannot recall what it is.  PT encouraged pt to bring medicine to future appt to verify that it is in his chart.  Blood sugar is 232 prior to onset of session (assessed w/ Dexcom).  He is wearing 5-ply sock today on RLE.  He denies falls or acute changes since visit prior.  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 Yes: NPRS scale: 5/10 Pain location: L foot around where big toe used to be and back of heel Pain description: throbbing Aggravating factors: walking Relieving factors: not walking, sleeping  PRECAUTIONS: Fall  WEIGHT BEARING RESTRICTIONS: No  FALLS: Has patient fallen in last 6 months? Yes. Number of falls 5  LIVING ENVIRONMENT: Lives with: lives with their family Lives in: House/apartment Home Access: Stairs to enter Home layout: One level Stairs: Yes: External: 4 steps; can reach both Has following equipment at home: Environmental consultant - 2 wheeled, Wheelchair (manual), shower chair, Grab bars, and SPC cane with quad tip  PLOF: Independent  PATIENT GOALS: "Be able to walk without a cane and without falling and be able to walk up/down hills."  OBJECTIVE:   DIAGNOSTIC FINDINGS:   12/08/2022 DG Foot Complete: IMPRESSION: 1. New curvilinear lucencies throughout the proximal 25% of the distal phalanx of the great toe, an acute to subacute comminuted fracture with intra-articular extension. 2. Apparent chronic soft tissue ulcer at the plantar aspect of the forefoot at the level of the distal  metatarsals, possibly at the great toe metatarsal head, similar to 01/18/2022. No definite cortical erosion.  COGNITION: Overall cognitive status: Within functional limits for tasks assessed   SENSATION: Decreased LE changes related to diabetic neuropathy and bilateral LE  POSTURE: rounded shoulders and forward head  LOWER EXTREMITY ROM:  Decreased ankle dorsiflexion and hip extension noted with functional testing  LOWER EXTREMITY MMT:  MMT Right eval Left eval  Hip flexion 3/5 3+/5  Hip extension    Hip abduction    Hip adduction    Hip internal rotation    Hip external rotation    Knee flexion 3/5 3+/5  Knee extension 3/5 3+/5  Ankle dorsiflexion  3+/5  Ankle plantarflexion    Ankle inversion    Ankle eversion    (Blank rows = not tested)  BED MOBILITY:  Patient reports that he is able to do so independently  TRANSFERS: Sit to stand: SBA Stand to sit: SBA Strong reliance on arms for transfers  GAIT: Gait pattern: decreased step length- Left, decreased stance time- Right, decreased stance time- Left, decreased stride length, trendelenburg, and wide BOS Distance walked: 2 x 80 feet Assistive device utilized:  SPC with  quad rubber tip Level of assistance: SBA Prosthetic pistoning during stance.   FUNCTIONAL TESTS:      CURRENT PROSTHETIC WEAR ASSESSMENT: Patient is independent with: ply sock cleaning, proper wear schedule/adjustment, and proper weight-bearing schedule/adjustment Patient is dependent with: skin check, residual limb care, and care of non-amputated limb Donning prosthesis: Complete Independence Doffing prosthesis: Complete Independence Prosthetic wear tolerance: 10 hours/day, 7 days/week Prosthetic weight bearing tolerance: 20-30 minutes Edema: no swelling noted Residual limb condition: redness noted behind knee on R side and slight redness noted on anterior aspect of knee superior to patellar pole, on left foot slight scap noted on healing  scar, patient following up with podiatry this week Prosthetic description: Unknown will follow up with [facility], socket liner poor fit with gapping along thigh K code/activity level with prosthetic use: Will clarify with [facility]  PATIENT SURVEYS:  ABC scale : 58.1% Confidence   TODAY'S TREATMENT:                                                                                                                                         -Clock taps firm surface using gumdrop targets progressing from BUE support to unilateral UE support, increased time to perform in RLE stance as pt reports it is difficult to get LLE to cooperate w/ placement -Standing marches w/ 3.3lb ball with chest hold x20 CGA > forward pallof press w/ marches w/ 3.3lb ball x20 In // bars: forward half lunge to [employer] dynadisc x5 w/ BUE support RLE in rear > x12 full lunge w/ unilateral UE support RLE in rear > x15 full lunge w/ LLE in rear BUE support > 4" LLE step taps w/ BUE support x5 > unilateral support x10 taps > no UE support x12 taps (RLE stance) CGA for all step taps w/ most sway w/o UE support, no LOB -Single leg bridge 3x8 each LE, modified RLE due to knee pain (residual limb over bolster)  TREATMENT (This is re-iterated every session):  PATIENT EDUCATED ON FOLLOWING PROSTHETIC CARE: Other education  Skin check - Educated on regular skin check (LEFT FOOT AND RESIDUAL LIMB) given past medical history, discussed techniques including use of mirror to assess difficult to see areas, recommended checking every day, discussed skin changes noted in today's session  PATIENT EDUCATION: Education details:  Continue HEP.  Continue skin checks.  Plan for discharge at end of current POC-pt nods in agreement. Person educated: Patient Education method: Explanation Education comprehension: verbalized understanding and needs further education  HOME EXERCISE PROGRAM: Access Code: [email or web address] URL: [email or web address] Date:  04/29/2023 Prepared by: Camille Bal  Exercises - Seated Ankle Inversion Eversion AROM  - 1 x daily - 5 x weekly - 2-3 sets - 10 reps - Sit to Stand with Armchair  - 1 x daily - 7 x weekly - 2 sets - 8 reps - Standing Balance in Corner with Eyes Closed  -  1 x daily - 5 x weekly - 3 sets - 10 reps - Standing March with Counter Support  - 1 x daily - 5 x weekly - 2 sets - 20 reps - Semi-Tandem Balance at [employer] Eyes Open  - 1 x daily - 5 x weekly - 1 sets - 2-3 reps - 45 seconds hold  ASSESSMENT:  CLINICAL IMPRESSION: Focus of skilled session on addressing SLS, LE strength and coordination of core engagement to SLS.  He remains limited by ongoing need for left foot orthotic to assist with over supinating and improve standing posture of the LLE and general balance.  He might benefit from partial temporary heel wedge to bias pronation, but PT is cautious due to risk for wound development without custom fit.  May trial at future visit to assess further benefit.  He continues to benefit from skilled PT interventions to progress safety with upright mobility and problem solve further services that may best serve patient needs.   OBJECTIVE IMPAIRMENTS: Abnormal gait, decreased activity tolerance, decreased balance, decreased mobility, difficulty walking, decreased ROM, decreased strength, impaired sensation, prosthetic dependency , and pain.   ACTIVITY LIMITATIONS: carrying, squatting, transfers, and locomotion level  PARTICIPATION LIMITATIONS: shopping, community activity, and yard work  PERSONAL FACTORS: Social background and 3+ comorbidities: see above  are also affecting patient's functional outcome.   REHAB POTENTIAL: Good  CLINICAL DECISION MAKING: Evolving/moderate complexity  EVALUATION COMPLEXITY: Moderate   GOALS: Goals reviewed with patient? Yes  SHORT TERM GOALS: Target date: 05/04/2023  Patient will demonstrate 100% compliance with initial HEP to continue to progress  between physical therapy sessions.   Baseline: Established and updated at last visit, pt reports compliance (6/12) Goal status: MET  2.  Assess ramp and write goal as indicated Baseline: Assessed 5/28 and goal written. Goal status: MET   LONG TERM GOALS: Target date: 05/25/2023  Patient will report demonstrate independence with final HEP in order to maintain current gains and continue to progress after physical therapy discharge.   Baseline:  Goal status: INITIAL  2.  Patient will improve their 5x Sit to Stand score to less than 20 seconds with minimal required UE support to demonstrate a decreased risk for falls and improved LE strength.   Baseline: 27 sec without UE support Goal status: INITIAL  3.  Patient will improve gait speed to 0.9 m/s to indicate improvement to the level of community ambulator in order to participate more easily in activities outside of the home.   Baseline: 0.8 m/s with SPC quad tip cane (SBA) Goal status: INITIAL  4.  Patient will improve TUG score to 13 seconds to indicate clinically significant progress towards a decreased risk of falls and improved mobility.  Baseline: 18.39 sec with SPC quad tip cane (SBA) Goal status: INITIAL  5.  Patient will improve ABC Score to 67% or greater to indicate a decreased risk for falls and improved self-reported confidence in balance and sense of steadiness.   Baseline: 58.1% Goal status: INITIAL  6.  Pt will ambulate up and down ramp incline no more than SBA using LRAD in order to improve unlevel surface management and functional safety. Baseline: CGA-minA w/ SPC w/ quad tip (5/28) Goal status: INITIAL   PLAN:  PT FREQUENCY: 2x/week  PT DURATION: 6 weeks  PLANNED INTERVENTIONS: Therapeutic exercises, Therapeutic activity, Neuromuscular re-education, Balance training, Gait training, Patient/Family education, Self Care, Joint mobilization, Manual therapy, and Re-evaluation  PLAN FOR NEXT SESSION: Did he hear  back about  financial assistance?  Gait training w/ SPC w/ quad rubber tip-ramp/curb/stairs/unlevel ground, Add to HEP prn.  LE strengthening, SLS tasks, leg press, forward reaching to floor/lifting style tasks, blue mat compliant surface tasks, glut/quad strengthening, green dynadisc-lunges/free standing, TRIAL LATERAL WEDGE TO BIAS PRONATION vs chronic posture he may need ground build up?   Sadie Haber, PT, DPT 05/19/2023, 2:53 PM

## 2023-05-20 ENCOUNTER — Encounter (HOSPITAL_COMMUNITY)
Admission: RE | Admit: 2023-05-20 | Discharge: 2023-05-20 | Disposition: A | Discharge status: Home / Self Care | Payer: 59 | Source: Ambulatory Visit | Attending: Nephrology | Admitting: Nephrology

## 2023-05-20 VITALS — BP 160/92 | HR 95 | Temp 97.9°F | Resp 18

## 2023-05-20 DIAGNOSIS — D649 Anemia, unspecified: Secondary | ICD-10-CM | POA: Diagnosis present

## 2023-05-20 DIAGNOSIS — N179 Acute kidney failure, unspecified: Secondary | ICD-10-CM | POA: Diagnosis not present

## 2023-05-20 DIAGNOSIS — D62 Acute posthemorrhagic anemia: Secondary | ICD-10-CM | POA: Insufficient documentation

## 2023-05-20 LAB — POCT HEMOGLOBIN-HEMACUE: Hemoglobin: 10.4 g/dL — ABNORMAL LOW (ref 13.0–17.0)

## 2023-05-20 MED ORDER — EPOETIN ALFA-EPBX 10000 UNIT/ML IJ SOLN: 20000.0000 [IU] | INTRAMUSCULAR | Status: DC

## 2023-05-20 MED ORDER — EPOETIN ALFA-EPBX 10000 UNIT/ML IJ SOLN
INTRAMUSCULAR | Status: AC
  Administered 2023-05-20: 20000 [IU] via SUBCUTANEOUS
  Filled 2023-05-20: qty 2

## 2023-05-21 ENCOUNTER — Encounter: Payer: Self-pay | Admitting: Physical Therapy

## 2023-05-21 ENCOUNTER — Ambulatory Visit: Payer: 59 | Admitting: Physical Therapy

## 2023-05-21 DIAGNOSIS — R296 Repeated falls: Secondary | ICD-10-CM

## 2023-05-21 DIAGNOSIS — R269 Unspecified abnormalities of gait and mobility: Secondary | ICD-10-CM

## 2023-05-21 DIAGNOSIS — R2681 Unsteadiness on feet: Secondary | ICD-10-CM

## 2023-05-21 NOTE — Therapy (Signed)
OUTPATIENT PHYSICAL THERAPY PROSTHETICS TREATMENT   Patient Name: [name] [name] MRN: [id number] DOB:[birth year], 52 y.o., male Today's Date: 05/21/2023  PCP: [name], MD REFERRING PROVIDER: Ancil Boozer, DPM  END OF SESSION:  PT End of Session - 05/21/23 1410     Visit Number 11    Number of Visits 13    Date for PT Re-Evaluation 06/08/23    Authorization Type [employer]    Progress Note Due on Visit 10    PT Start Time 1402    PT Stop Time 1444    PT Time Calculation (min) 42 min    Equipment Utilized During Treatment Gait belt    Activity Tolerance Patient tolerated treatment well;Patient limited by pain    Behavior During Therapy Flat affect            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)    Depression    Diabetes mellitus without complication (HCC) 2019   GERD (gastroesophageal reflux disease)    Hyperlipidemia    Hypertension    Necrotizing fasciitis (HCC) 10/13/2017   Neuromuscular disorder (HCC)    neuropathy   PAD (peripheral artery disease) (HCC)    Secondary hyperparathyroidism (HCC)    Wound dehiscence 11/24/2017   Past Surgical History:  Procedure Laterality Date   AMPUTATION Right 10/13/2017   Procedure: RIGHT BELOW KNEE AMPUTATION;  Surgeon: Nadara Mustard, MD;  Location: [facility] OR;  Service: Orthopedics;  Laterality: Right;   AMPUTATION Right 11/24/2017   Procedure: AMPUTATION BELOW KNEE REVISION;  Surgeon: Nadara Mustard, MD;  Location: [facility] OR;  Service: Orthopedics;  Laterality: Right;   AMPUTATION TOE Left    BIOPSY  09/02/2021   Procedure: BIOPSY;  Surgeon: Imogene Burn, MD;  Location: [facility] ENDOSCOPY;  Service: Gastroenterology;;   BIOPSY  02/18/2023   Procedure: BIOPSY;  Surgeon: Benancio Deeds, MD;  Location: [facility]  ENDOSCOPY;  Service: Gastroenterology;;   COLONOSCOPY  05/11/2019   ESOPHAGOGASTRODUODENOSCOPY (EGD) WITH PROPOFOL N/A 09/02/2021   Procedure: ESOPHAGOGASTRODUODENOSCOPY (EGD) WITH PROPOFOL;  Surgeon: Imogene Burn, MD;  Location: [facility] ENDOSCOPY;  Service: Gastroenterology;  Laterality: N/A;   ESOPHAGOGASTRODUODENOSCOPY (EGD) WITH PROPOFOL N/A 02/18/2023   Procedure: ESOPHAGOGASTRODUODENOSCOPY (EGD) WITH PROPOFOL;  Surgeon: Benancio Deeds, MD;  Location: [facility] ENDOSCOPY;  Service: Gastroenterology;  Laterality: N/A;   NO PAST SURGERIES     POLYPECTOMY     WISDOM TOOTH EXTRACTION     Patient Active Problem List   Diagnosis Date Noted   Mucosal abnormality of esophagus 02/18/2023   Foot pain, left 02/17/2023   SOB (shortness of breath) 02/16/2023   History of esophagitis 02/16/2023   Tachycardia 02/15/2023   Allergic rhinitis 11/07/2022   Healthcare maintenance 11/07/2022   Stage 5 chronic kidney disease (HCC) 09/11/2022   Tobacco dependence 09/11/2022   GERD with esophagitis 09/02/2021   AKI (acute kidney injury) (HCC) 04/10/2021   Achilles tendon contracture, left 03/03/2018   Non-pressure chronic ulcer of other part of left foot limited to breakdown of skin (HCC) 02/02/2018   History of right below knee amputation (HCC) 12/22/2017   Acute posthemorrhagic anemia    Falls 11/24/2017   Essential hypertension 11/24/2017   Normocytic normochromic anemia 11/24/2017   Uncontrolled type 2 diabetes mellitus with hyperglycemia, with  long-term current use of insulin (HCC) 10/11/2017   ARF (acute renal failure) (HCC) 10/11/2017    ONSET DATE: 04/09/2023 (referral)  REFERRING DIAG: R26.89 (ICD-10-CM) - Other abnormalities of gait and mobility  THERAPY DIAG:  Abnormality of gait and mobility  Unsteadiness on feet  Repeated falls  Rationale for Evaluation and Treatment: Rehabilitation  SUBJECTIVE:   SUBJECTIVE STATEMENT: He did not bring name of new medication mentioned last  visit.  Blood sugar is 156 prior to onset of session (assessed w/ Dexcom).  He is wearing 5-ply sock again today on RLE.  He denies falls or acute changes since visit prior.  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 Yes: NPRS scale: 5/10 Pain location: L foot around where big toe used to be and back of heel Pain description: throbbing Aggravating factors: walking Relieving factors: not walking, sleeping  PRECAUTIONS: Fall  WEIGHT BEARING RESTRICTIONS: No  FALLS: Has patient fallen in last 6 months? Yes. Number of falls 5  LIVING ENVIRONMENT: Lives with: lives with their family Lives in: House/apartment Home Access: Stairs to enter Home layout: One level Stairs: Yes: External: 4 steps; can reach both Has following equipment at home: Environmental consultant - 2 wheeled, Wheelchair (manual), shower chair, Grab bars, and SPC cane with quad tip  PLOF: Independent  PATIENT GOALS: "Be able to walk without a cane and without falling and be able to walk up/down hills."  OBJECTIVE:   DIAGNOSTIC FINDINGS:   12/08/2022 DG Foot Complete: IMPRESSION: 1. New curvilinear lucencies throughout the proximal 25% of the distal phalanx of the great toe, an acute to subacute comminuted fracture with intra-articular extension. 2. Apparent chronic soft tissue ulcer at the plantar aspect of the forefoot at the level of the distal metatarsals, possibly at the great toe metatarsal head, similar to 01/18/2022. No definite cortical erosion.  COGNITION: Overall cognitive status: Within functional limits for tasks assessed   SENSATION: Decreased LE changes related to diabetic neuropathy and bilateral LE  POSTURE: rounded shoulders and forward head  LOWER EXTREMITY ROM:  Decreased ankle  dorsiflexion and hip extension noted with functional testing  LOWER EXTREMITY MMT:  MMT Right eval Left eval  Hip flexion 3/5 3+/5  Hip extension    Hip abduction    Hip adduction    Hip internal rotation    Hip external rotation    Knee flexion 3/5 3+/5  Knee extension 3/5 3+/5  Ankle dorsiflexion  3+/5  Ankle plantarflexion    Ankle inversion    Ankle eversion    (Blank rows = not tested)  BED MOBILITY:  Patient reports that he is able to do so independently  TRANSFERS: Sit to stand: SBA Stand to sit: SBA Strong reliance on arms for transfers  GAIT: Gait pattern: decreased step length- Left, decreased stance time- Right, decreased stance time- Left, decreased stride length, trendelenburg, and wide BOS Distance walked: 2 x 80 feet Assistive device utilized:  SPC with quad rubber tip Level of assistance: SBA Prosthetic pistoning during stance.   FUNCTIONAL TESTS:      CURRENT PROSTHETIC WEAR ASSESSMENT: Patient is independent with: ply sock cleaning, proper wear schedule/adjustment, and proper weight-bearing schedule/adjustment Patient is dependent with: skin check, residual limb care, and care of non-amputated limb Donning prosthesis: Complete Independence Doffing prosthesis: Complete Independence Prosthetic  wear tolerance: 10 hours/day, 7 days/week Prosthetic weight bearing tolerance: 20-30 minutes Edema: no swelling noted Residual limb condition: redness noted behind knee on R side and slight redness noted on anterior aspect of knee superior to patellar pole, on left foot slight scap noted on healing scar, patient following up with podiatry this week Prosthetic description: Unknown will follow up with [facility], socket liner poor fit with gapping along thigh K code/activity level with prosthetic use: Will clarify with [facility]  PATIENT SURVEYS:  ABC scale : 58.1% Confidence   TODAY'S TREATMENT:                                                                                                                                          -Time spent cutting partial lateral heel wedge to fit patient shoe then trialing x200' of ambulation, correction noted of prior supinated foot posture suggesting flexible foot deformity.  He would likely benefit from custom orthotic as partial heel wedge not provided to patient today due to reports of increased tingling in toes compared to without with therapist having concern for potential increased pressure in toes near amputation site and possible wound development.  Discussed shoes being worn into this inverted posture with lateral sole worn down creating risk of slipping out of shoe due to lateral heel sitting up higher than normal.  Did recommend patient seeking financial resources from prior list provided to help him obtain a custom orthotic from podiatry.  He nods in agreement.  Further encouraged skin checks as PT is unsure patient is checking left foot regularly.  -Standing in // bars progressing from BUE support to none w/ CGA-minA for forward alternating lunges to green dynadisc into progressed ROM, more difficulty in RLE stance -Deadlifts w/ 10lb kettlebell x10 w/ cues for posterior chain use and correct posture, but pt maintains chronic rounded posture so used wall for excess time spent in part practice of deadlift technique w/ progression from hip hinge > hip hinge w/ forward reach to hands raised w/ head extension to promote flattened spine w/ hip hinge (used wall bumps for tactile cue and safety)  TREATMENT (This is re-iterated every session):  PATIENT EDUCATED ON FOLLOWING PROSTHETIC CARE: Other education  Skin check - Educated on regular skin check (LEFT FOOT AND RESIDUAL LIMB) given past medical history, discussed techniques including use of mirror to assess difficult to see areas, recommended checking every day, discussed skin changes noted in today's session  PATIENT EDUCATION: Education details:  Continue HEP.   Continue skin checks.  Plan for discharge at end of current POC-pt nods in agreement. Person educated: Patient Education method: Explanation Education comprehension: verbalized understanding and needs further education  HOME EXERCISE PROGRAM: Access Code: [email or web address] URL: [email or web address] Date: 04/29/2023 Prepared by: Camille Bal  Exercises - Seated Ankle Inversion Eversion AROM  - 1 x daily - 5 x weekly - 2-3 sets - 10 reps -  Sit to Stand with Armchair  - 1 x daily - 7 x weekly - 2 sets - 8 reps - Standing Balance in Corner with Eyes Closed  - 1 x daily - 5 x weekly - 3 sets - 10 reps - Standing March with Counter Support  - 1 x daily - 5 x weekly - 2 sets - 20 reps - Semi-Tandem Balance at [employer] Eyes Open  - 1 x daily - 5 x weekly - 1 sets - 2-3 reps - 45 seconds hold  ASSESSMENT:  CLINICAL IMPRESSION: Time spent in session assessing benefit of lateral wedging of the left foot with good affects noted on mechanics of gait and general dynamic stability.  Not provided to patient for home due to reports of excess tingling near partial amputation of left foot and fear of skin breakdown with prolonged wear.  Did encourage patient to continue pursuit of finances with amputee resources provided to promote ability to obtain custom orthotic to help with this.  Continued to work on dynamic SLS and postural improvements with lunges and hip hinging tasks.  He continues to benefit from skilled PT to promote improved safety with upright mobility secondary to dynamic instability, chronic postural changes, prosthetic dependency, and pain.   OBJECTIVE IMPAIRMENTS: Abnormal gait, decreased activity tolerance, decreased balance, decreased mobility, difficulty walking, decreased ROM, decreased strength, impaired sensation, prosthetic dependency , and pain.   ACTIVITY LIMITATIONS: carrying, squatting, transfers, and locomotion level  PARTICIPATION LIMITATIONS: shopping, community  activity, and yard work  PERSONAL FACTORS: Social background and 3+ comorbidities: see above  are also affecting patient's functional outcome.   REHAB POTENTIAL: Good  CLINICAL DECISION MAKING: Evolving/moderate complexity  EVALUATION COMPLEXITY: Moderate   GOALS: Goals reviewed with patient? Yes  SHORT TERM GOALS: Target date: 05/04/2023  Patient will demonstrate 100% compliance with initial HEP to continue to progress between physical therapy sessions.   Baseline: Established and updated at last visit, pt reports compliance (6/12) Goal status: MET  2.  Assess ramp and write goal as indicated Baseline: Assessed 5/28 and goal written. Goal status: MET   LONG TERM GOALS: Target date: 05/25/2023  Patient will report demonstrate independence with final HEP in order to maintain current gains and continue to progress after physical therapy discharge.   Baseline:  Goal status: INITIAL  2.  Patient will improve their 5x Sit to Stand score to less than 20 seconds with minimal required UE support to demonstrate a decreased risk for falls and improved LE strength.   Baseline: 27 sec without UE support Goal status: INITIAL  3.  Patient will improve gait speed to 0.9 m/s to indicate improvement to the level of community ambulator in order to participate more easily in activities outside of the home.   Baseline: 0.8 m/s with SPC quad tip cane (SBA) Goal status: INITIAL  4.  Patient will improve TUG score to 13 seconds to indicate clinically significant progress towards a decreased risk of falls and improved mobility.  Baseline: 18.39 sec with SPC quad tip cane (SBA) Goal status: INITIAL  5.  Patient will improve ABC Score to 67% or greater to indicate a decreased risk for falls and improved self-reported confidence in balance and sense of steadiness.   Baseline: 58.1% Goal status: INITIAL  6.  Pt will ambulate up and down ramp incline no more than SBA using LRAD in order to improve  unlevel surface management and functional safety. Baseline: CGA-minA w/ SPC w/ quad tip (5/28) Goal status: INITIAL  PLAN:  PT FREQUENCY: 2x/week  PT DURATION: 6 weeks  PLANNED INTERVENTIONS: Therapeutic exercises, Therapeutic activity, Neuromuscular re-education, Balance training, Gait training, Patient/Family education, Self Care, Joint mobilization, Manual therapy, and Re-evaluation  PLAN FOR NEXT SESSION: Did he hear back about financial assistance?  Gait training w/ SPC w/ quad rubber tip-ramp/curb/stairs/unlevel ground, Add to HEP prn.  LE strengthening, SLS tasks, leg press, forward reaching to floor/lifting style tasks, blue mat compliant surface tasks, glut/quad strengthening   Sadie Haber, PT, DPT 05/21/2023, 2:45 PM

## 2023-05-25 ENCOUNTER — Telehealth: Payer: Self-pay

## 2023-05-25 NOTE — Telephone Encounter (Signed)
Attempted to reach pt to schedule his appt needed prior to proceeding with surgery scheduling, per MD. LVM for him to call us back.

## 2023-05-26 ENCOUNTER — Ambulatory Visit: Payer: 59 | Attending: Physical Medicine and Rehabilitation | Admitting: Physical Therapy

## 2023-05-26 ENCOUNTER — Encounter: Payer: Self-pay | Admitting: Student

## 2023-05-26 ENCOUNTER — Encounter: Payer: Self-pay | Admitting: Physical Therapy

## 2023-05-26 ENCOUNTER — Other Ambulatory Visit: Payer: Self-pay | Admitting: Student

## 2023-05-26 DIAGNOSIS — R269 Unspecified abnormalities of gait and mobility: Secondary | ICD-10-CM

## 2023-05-26 DIAGNOSIS — R2681 Unsteadiness on feet: Secondary | ICD-10-CM | POA: Diagnosis not present

## 2023-05-26 DIAGNOSIS — R296 Repeated falls: Secondary | ICD-10-CM | POA: Diagnosis not present

## 2023-05-26 DIAGNOSIS — I10 Essential (primary) hypertension: Secondary | ICD-10-CM

## 2023-05-26 NOTE — Therapy (Signed)
OUTPATIENT PHYSICAL THERAPY PROSTHETICS TREATMENT   Patient Name: [name] [name] MRN: [id number] DOB:December 18, 1970, 52 y.o., male Today's Date: 05/26/2023  PCP: [name], MD REFERRING PROVIDER: Ancil Boozer, DPM  END OF SESSION:  PT End of Session - 05/26/23 1407     Visit Number 12    Number of Visits 13    Date for PT Re-Evaluation 06/08/23    Authorization Type [employer]    Progress Note Due on Visit 10    PT Start Time 1401    PT Stop Time 1446    PT Time Calculation (min) 45 min    Equipment Utilized During Treatment Gait belt    Activity Tolerance Patient tolerated treatment well;Patient limited by pain    Behavior During Therapy Flat affect            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)    Depression    Diabetes mellitus without complication (HCC) 2019   GERD (gastroesophageal reflux disease)    Hyperlipidemia    Hypertension    Necrotizing fasciitis (HCC) 10/13/2017   Neuromuscular disorder (HCC)    neuropathy   PAD (peripheral artery disease) (HCC)    Secondary hyperparathyroidism (HCC)    Wound dehiscence 11/24/2017   Past Surgical History:  Procedure Laterality Date   AMPUTATION Right 10/13/2017   Procedure: RIGHT BELOW KNEE AMPUTATION;  Surgeon: Nadara Mustard, MD;  Location: [facility] OR;  Service: Orthopedics;  Laterality: Right;   AMPUTATION Right 11/24/2017   Procedure: AMPUTATION BELOW KNEE REVISION;  Surgeon: Nadara Mustard, MD;  Location: [facility] OR;  Service: Orthopedics;  Laterality: Right;   AMPUTATION TOE Left    BIOPSY  09/02/2021   Procedure: BIOPSY;  Surgeon: Imogene Burn, MD;  Location: [facility] ENDOSCOPY;  Service: Gastroenterology;;   BIOPSY  02/18/2023   Procedure: BIOPSY;  Surgeon: Benancio Deeds, MD;  Location: [facility]  ENDOSCOPY;  Service: Gastroenterology;;   COLONOSCOPY  05/11/2019   ESOPHAGOGASTRODUODENOSCOPY (EGD) WITH PROPOFOL N/A 09/02/2021   Procedure: ESOPHAGOGASTRODUODENOSCOPY (EGD) WITH PROPOFOL;  Surgeon: Imogene Burn, MD;  Location: [facility] ENDOSCOPY;  Service: Gastroenterology;  Laterality: N/A;   ESOPHAGOGASTRODUODENOSCOPY (EGD) WITH PROPOFOL N/A 02/18/2023   Procedure: ESOPHAGOGASTRODUODENOSCOPY (EGD) WITH PROPOFOL;  Surgeon: Benancio Deeds, MD;  Location: [facility] ENDOSCOPY;  Service: Gastroenterology;  Laterality: N/A;   NO PAST SURGERIES     POLYPECTOMY     WISDOM TOOTH EXTRACTION     Patient Active Problem List   Diagnosis Date Noted   Mucosal abnormality of esophagus 02/18/2023   Foot pain, left 02/17/2023   SOB (shortness of breath) 02/16/2023   History of esophagitis 02/16/2023   Tachycardia 02/15/2023   Allergic rhinitis 11/07/2022   Healthcare maintenance 11/07/2022   Stage 5 chronic kidney disease (HCC) 09/11/2022   Tobacco dependence 09/11/2022   GERD with esophagitis 09/02/2021   AKI (acute kidney injury) (HCC) 04/10/2021   Achilles tendon contracture, left 03/03/2018   Non-pressure chronic ulcer of other part of left foot limited to breakdown of skin (HCC) 02/02/2018   History of right below knee amputation (HCC) 12/22/2017   Acute posthemorrhagic anemia    Falls 11/24/2017   Essential hypertension 11/24/2017   Normocytic normochromic anemia 11/24/2017   Uncontrolled type 2 diabetes mellitus with hyperglycemia, with  long-term current use of insulin (HCC) 10/11/2017   ARF (acute renal failure) (HCC) 10/11/2017    ONSET DATE: 04/09/2023 (referral)  REFERRING DIAG: R26.89 (ICD-10-CM) - Other abnormalities of gait and mobility  THERAPY DIAG:  Abnormality of gait and mobility  Unsteadiness on feet  Repeated falls  Rationale for Evaluation and Treatment: Rehabilitation  SUBJECTIVE:   SUBJECTIVE STATEMENT: Patient is having more pain today in his left foot than  normal, but has not done any different activity.  He is wearing 5-ply sock again today on RLE.  He denies falls or acute changes since visit prior.  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 Yes: NPRS scale: 6/10 Pain location: L foot around where big toe used to be and back of heel Pain description: throbbing Aggravating factors: walking Relieving factors: not walking, sleeping  PRECAUTIONS: Fall  WEIGHT BEARING RESTRICTIONS: No  FALLS: Has patient fallen in last 6 months? Yes. Number of falls 5  LIVING ENVIRONMENT: Lives with: lives with their family Lives in: House/apartment Home Access: Stairs to enter Home layout: One level Stairs: Yes: External: 4 steps; can reach both Has following equipment at home: Environmental consultant - 2 wheeled, Wheelchair (manual), shower chair, Grab bars, and SPC cane with quad tip  PLOF: Independent  PATIENT GOALS: "Be able to walk without a cane and without falling and be able to walk up/down hills."  OBJECTIVE:   DIAGNOSTIC FINDINGS:   12/08/2022 DG Foot Complete: IMPRESSION: 1. New curvilinear lucencies throughout the proximal 25% of the distal phalanx of the great toe, an acute to subacute comminuted fracture with intra-articular extension. 2. Apparent chronic soft tissue ulcer at the plantar aspect of the forefoot at the level of the distal metatarsals, possibly at the great toe metatarsal head, similar to 01/18/2022. No definite cortical erosion.  COGNITION: Overall cognitive status: Within functional limits for tasks assessed   SENSATION: Decreased LE changes related to diabetic neuropathy and bilateral LE  POSTURE: rounded shoulders and forward head  LOWER EXTREMITY ROM:  Decreased ankle dorsiflexion and hip extension noted with functional  testing  LOWER EXTREMITY MMT:  MMT Right eval Left eval  Hip flexion 3/5 3+/5  Hip extension    Hip abduction    Hip adduction    Hip internal rotation    Hip external rotation    Knee flexion 3/5 3+/5  Knee extension 3/5 3+/5  Ankle dorsiflexion  3+/5  Ankle plantarflexion    Ankle inversion    Ankle eversion    (Blank rows = not tested)  BED MOBILITY:  Patient reports that he is able to do so independently  TRANSFERS: Sit to stand: SBA Stand to sit: SBA Strong reliance on arms for transfers  GAIT: Gait pattern: decreased step length- Left, decreased stance time- Right, decreased stance time- Left, decreased stride length, trendelenburg, and wide BOS Distance walked: 2 x 80 feet Assistive device utilized:  SPC with quad rubber tip Level of assistance: SBA Prosthetic pistoning during stance.   FUNCTIONAL TESTS:      CURRENT PROSTHETIC WEAR ASSESSMENT: Patient is independent with: ply sock cleaning, proper wear schedule/adjustment, and proper weight-bearing schedule/adjustment Patient is dependent with: skin check, residual limb care, and care of non-amputated limb Donning prosthesis: Complete Independence Doffing prosthesis: Complete Independence Prosthetic wear tolerance: 10 hours/day, 7 days/week Prosthetic weight bearing  tolerance: 20-30 minutes Edema: no swelling noted Residual limb condition: redness noted behind knee on R side and slight redness noted on anterior aspect of knee superior to patellar pole, on left foot slight scap noted on healing scar, patient following up with podiatry this week Prosthetic description: Unknown will follow up with [facility], socket liner poor fit with gapping along thigh K code/activity level with prosthetic use: Will clarify with [facility]  PATIENT SURVEYS:  ABC scale : 58.1% Confidence   TODAY'S TREATMENT:                                                                                                                                          -SciFit BLE only in twin peak mode x8 minutes for dynamic cardiovascular warmup and strength training.   -Single LE leg press 2x15 each LE (left on 40 lbs and right on 30 lbs) Blood sugar is 250 midway through session (assessed w/ Dexcom).  GAIT: Gait pattern: step through pattern, decreased stride length, decreased hip/knee flexion- Left, decreased trunk rotation, narrow BOS, poor foot clearance- Right, and poor foot clearance- Left Distance walked: 500' Assistive device utilized:  [facility] w/ quad rubber tip Level of assistance: CGA Comments: Pt demonstates difficulty with maintaining anterior weight shift to safely navigate incline on sidewalk.  He has several instances of posterior LOB w/ CGA-minA to recover.  Wind poses a challenge for patient as he overcorrects when experiencing a perturbation.  Recommended trialing a rollator for outdoor long distance and unlevel surfaces with patient stating he has tried that before and is not interested.  Did emphasize fall prevention and safe community access.  Pt is CGA-minA getting into half kneel on floor mat and then returning to stand and sit on mat table.  He needs increased time and cues for sequencing but does well with task.  Has some difficulty rolling from his butt to his knees so may review if pt feeling up to it next session.  TREATMENT (This is re-iterated every session):  PATIENT EDUCATED ON FOLLOWING PROSTHETIC CARE: Other education  Skin check - Educated on regular skin check (LEFT FOOT AND RESIDUAL LIMB) given past medical history, discussed techniques including use of mirror to assess difficult to see areas, recommended checking every day, discussed skin changes noted in today's session  PATIENT EDUCATION: Education details:  Continue HEP.  Continue skin checks.  Plan for discharge at end of current POC-pt verbalizes agreement today.  Podiatry follow-up if pt is able to afford-check into amputee financial resources previously  provided. Person educated: Patient Education method: Explanation Education comprehension: verbalized understanding and needs further education  HOME EXERCISE PROGRAM: Access Code: [email or web address] URL: [email or web address] Date: 04/29/2023 Prepared by: Camille Bal  Exercises - Seated Ankle Inversion Eversion AROM  - 1 x daily - 5 x weekly - 2-3 sets - 10 reps - Sit to Stand with Armchair  - 1 x  daily - 7 x weekly - 2 sets - 8 reps - Standing Balance in Corner with Eyes Closed  - 1 x daily - 5 x weekly - 3 sets - 10 reps - Standing March with Counter Support  - 1 x daily - 5 x weekly - 2 sets - 20 reps - Semi-Tandem Balance at [employer] Eyes Open  - 1 x daily - 5 x weekly - 1 sets - 2-3 reps - 45 seconds hold  ASSESSMENT:  CLINICAL IMPRESSION: Continued to work on functional LE strengthening and gait training.  He would benefit from rollator use for long and unlevel distances, but he is not open to this option at this time.  He has not heard back about any of the financial resources he has applied for so PT encouraged him to continue his efforts as her would still benefit from custom orthotic, but cannot afford the copay for this or a new socket liner.  Will assess LTGs next session and discharge with patient in agreement.  OBJECTIVE IMPAIRMENTS: Abnormal gait, decreased activity tolerance, decreased balance, decreased mobility, difficulty walking, decreased ROM, decreased strength, impaired sensation, prosthetic dependency , and pain.   ACTIVITY LIMITATIONS: carrying, squatting, transfers, and locomotion level  PARTICIPATION LIMITATIONS: shopping, community activity, and yard work  PERSONAL FACTORS: Social background and 3+ comorbidities: see above  are also affecting patient's functional outcome.   REHAB POTENTIAL: Good  CLINICAL DECISION MAKING: Evolving/moderate complexity  EVALUATION COMPLEXITY: Moderate   GOALS: Goals reviewed with patient? Yes  SHORT TERM  GOALS: Target date: 05/04/2023  Patient will demonstrate 100% compliance with initial HEP to continue to progress between physical therapy sessions.   Baseline: Established and updated at last visit, pt reports compliance (6/12) Goal status: MET  2.  Assess ramp and write goal as indicated Baseline: Assessed 5/28 and goal written. Goal status: MET   LONG TERM GOALS: Target date: 05/25/2023  Patient will report demonstrate independence with final HEP in order to maintain current gains and continue to progress after physical therapy discharge.   Baseline:  Goal status: INITIAL  2.  Patient will improve their 5x Sit to Stand score to less than 20 seconds with minimal required UE support to demonstrate a decreased risk for falls and improved LE strength.   Baseline: 27 sec without UE support Goal status: INITIAL  3.  Patient will improve gait speed to 0.9 m/s to indicate improvement to the level of community ambulator in order to participate more easily in activities outside of the home.   Baseline: 0.8 m/s with SPC quad tip cane (SBA) Goal status: INITIAL  4.  Patient will improve TUG score to 13 seconds to indicate clinically significant progress towards a decreased risk of falls and improved mobility.  Baseline: 18.39 sec with SPC quad tip cane (SBA) Goal status: INITIAL  5.  Patient will improve ABC Score to 67% or greater to indicate a decreased risk for falls and improved self-reported confidence in balance and sense of steadiness.   Baseline: 58.1% Goal status: INITIAL  6.  Pt will ambulate up and down ramp incline no more than SBA using LRAD in order to improve unlevel surface management and functional safety. Baseline: CGA-minA w/ SPC w/ quad tip (5/28) Goal status: INITIAL   PLAN:  PT FREQUENCY: 2x/week  PT DURATION: 6 weeks  PLANNED INTERVENTIONS: Therapeutic exercises, Therapeutic activity, Neuromuscular re-education, Balance training, Gait training, Patient/Family  education, Self Care, Joint mobilization, Manual therapy, and Re-evaluation  PLAN FOR NEXT  SESSION: Assess LTGs-D/C!  Review rolling from bottom to quadruped for floor recovery.   Sadie Haber, PT, DPT 05/26/2023, 2:50 PM

## 2023-05-28 ENCOUNTER — Other Ambulatory Visit: Payer: Self-pay | Admitting: Student

## 2023-05-28 ENCOUNTER — Telehealth: Payer: Self-pay | Admitting: Student

## 2023-05-28 ENCOUNTER — Other Ambulatory Visit: Payer: 59

## 2023-05-28 ENCOUNTER — Encounter: Payer: Self-pay | Admitting: Physical Therapy

## 2023-05-28 ENCOUNTER — Ambulatory Visit: Payer: 59 | Admitting: Physical Therapy

## 2023-05-28 DIAGNOSIS — Z794 Long term (current) use of insulin: Secondary | ICD-10-CM | POA: Diagnosis not present

## 2023-05-28 DIAGNOSIS — R269 Unspecified abnormalities of gait and mobility: Secondary | ICD-10-CM | POA: Diagnosis not present

## 2023-05-28 DIAGNOSIS — R296 Repeated falls: Secondary | ICD-10-CM

## 2023-05-28 DIAGNOSIS — I10 Essential (primary) hypertension: Secondary | ICD-10-CM

## 2023-05-28 DIAGNOSIS — E1165 Type 2 diabetes mellitus with hyperglycemia: Secondary | ICD-10-CM

## 2023-05-28 DIAGNOSIS — R2681 Unsteadiness on feet: Secondary | ICD-10-CM | POA: Diagnosis not present

## 2023-05-28 MED ORDER — HYDRALAZINE HCL 50 MG PO TABS
75.0000 mg | ORAL_TABLET | Freq: Three times a day (TID) | ORAL | 0 refills | Status: DC
Start: 2023-05-28 — End: 2023-07-06

## 2023-05-28 NOTE — Telephone Encounter (Signed)
Attempted call x2 no answer-left VM to look at [facility]

## 2023-05-28 NOTE — Therapy (Signed)
OUTPATIENT PHYSICAL THERAPY PROSTHETICS TREATMENT - DISCHARGE SUMMARY   Patient Name: [name] [name] MRN: [id number] DOB:Mar 02, 1971, 52 y.o., male Today's Date: 05/28/2023  PCP: [name], MD REFERRING PROVIDER: Ancil Boozer, DPM  PHYSICAL THERAPY DISCHARGE SUMMARY  Visits from Start of Care: 13  Current functional level related to goals / functional outcomes: See clinical impression statement.   Remaining deficits: Possible need for left partial orthotic to improve chronic foot posturing, reliance on cane (would benefit from rollator for long and unlevel distances at a minimum), general    Education / Equipment: Continue HEP.  Continue skin checks.  Plan for discharge with goal assessment today-pt verbalizes agreement today.  Podiatry follow-up if pt is able to afford-check into amputee financial resources previously provided.  Discussed rotating quad tip on cane for improved grip on weight-bearing side as one prong on weight-bearing side is worn down compared to others.  Progress towards goals.  Patient agrees to discharge. Patient goals were partially met. Patient is being discharged due to maximized rehab potential.    END OF SESSION:  PT End of Session - 05/28/23 1403     Visit Number 13    Number of Visits 13    Date for PT Re-Evaluation 06/08/23    Authorization Type [employer]    Progress Note Due on Visit 10    PT Start Time 1400    PT Stop Time 1440    PT Time Calculation (min) 40 min    Equipment Utilized During Treatment Gait belt    Activity Tolerance Patient tolerated treatment well;Patient limited by pain    Behavior During Therapy Flat affect            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)    Depression    Diabetes mellitus  without complication (HCC) 2019   GERD (gastroesophageal reflux disease)    Hyperlipidemia    Hypertension    Necrotizing fasciitis (HCC) 10/13/2017   Neuromuscular disorder (HCC)    neuropathy   PAD (peripheral artery disease) (HCC)    Secondary hyperparathyroidism (HCC)    Wound dehiscence 11/24/2017   Past Surgical History:  Procedure Laterality Date   AMPUTATION Right 10/13/2017   Procedure: RIGHT BELOW KNEE AMPUTATION;  Surgeon: Nadara Mustard, MD;  Location: [facility] OR;  Service: Orthopedics;  Laterality: Right;   AMPUTATION Right 11/24/2017   Procedure: AMPUTATION BELOW KNEE REVISION;  Surgeon: Nadara Mustard, MD;  Location: [facility] OR;  Service: Orthopedics;  Laterality: Right;   AMPUTATION TOE Left    BIOPSY  09/02/2021   Procedure: BIOPSY;  Surgeon: Imogene Burn, MD;  Location: [facility] ENDOSCOPY;  Service: Gastroenterology;;   BIOPSY  02/18/2023   Procedure: BIOPSY;  Surgeon: Benancio Deeds, MD;  Location: [facility] ENDOSCOPY;  Service: Gastroenterology;;   COLONOSCOPY  05/11/2019   ESOPHAGOGASTRODUODENOSCOPY (EGD) WITH PROPOFOL N/A 09/02/2021   Procedure: ESOPHAGOGASTRODUODENOSCOPY (EGD) WITH PROPOFOL;  Surgeon: Imogene Burn, MD;  Location: [facility] ENDOSCOPY;  Service: Gastroenterology;  Laterality: N/A;   ESOPHAGOGASTRODUODENOSCOPY (EGD) WITH PROPOFOL N/A 02/18/2023   Procedure: ESOPHAGOGASTRODUODENOSCOPY (EGD) WITH PROPOFOL;  Surgeon: Benancio Deeds, MD;  Location: [facility] ENDOSCOPY;  Service: Gastroenterology;  Laterality: N/A;   NO PAST SURGERIES     POLYPECTOMY     WISDOM TOOTH  EXTRACTION     Patient Active Problem List   Diagnosis Date Noted   Mucosal abnormality of esophagus 02/18/2023   Foot pain, left 02/17/2023   SOB (shortness of breath) 02/16/2023   History of esophagitis 02/16/2023   Tachycardia 02/15/2023   Allergic rhinitis 11/07/2022   Healthcare maintenance 11/07/2022   Stage 5 chronic kidney disease (HCC) 09/11/2022   Tobacco dependence 09/11/2022   GERD with  esophagitis 09/02/2021   AKI (acute kidney injury) (HCC) 04/10/2021   Achilles tendon contracture, left 03/03/2018   Non-pressure chronic ulcer of other part of left foot limited to breakdown of skin (HCC) 02/02/2018   History of right below knee amputation (HCC) 12/22/2017   Acute posthemorrhagic anemia    Falls 11/24/2017   Essential hypertension 11/24/2017   Normocytic normochromic anemia 11/24/2017   Uncontrolled type 2 diabetes mellitus with hyperglycemia, with long-term current use of insulin (HCC) 10/11/2017   ARF (acute renal failure) (HCC) 10/11/2017    ONSET DATE: 04/09/2023 (referral)  REFERRING DIAG: R26.89 (ICD-10-CM) - Other abnormalities of gait and mobility  THERAPY DIAG:  Abnormality of gait and mobility  Unsteadiness on feet  Repeated falls  Rationale for Evaluation and Treatment: Rehabilitation  SUBJECTIVE:   SUBJECTIVE STATEMENT: Patient had a near fall walking up a hill and his left shoe went sideways.  A lady gave him a chair to sit down on and put his shoe back on.  He is wearing 5-ply sock again today on RLE.  He denies complete falls or acute changes since visit prior.  He is still in agreement to discharge today.  His blood sugar is 115 prior to onset of session.  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 Yes: NPRS scale: 5/10 Pain location: L foot around where big toe used to be and back of heel Pain description: throbbing Aggravating factors: walking Relieving factors: not walking, sleeping  PRECAUTIONS: Fall  WEIGHT BEARING RESTRICTIONS: No  FALLS: Has patient fallen in last 6 months? Yes. Number of falls 5  LIVING ENVIRONMENT: Lives with: lives with their family Lives in: House/apartment Home Access: Stairs to enter Home layout: One  level Stairs: Yes: External: 4 steps; can reach both Has following equipment at home: Environmental consultant - 2 wheeled, Wheelchair (manual), shower chair, Grab bars, and SPC cane with quad tip  PLOF: Independent  PATIENT GOALS: "Be able to walk without a cane and without falling and be able to walk up/down hills."  OBJECTIVE:   DIAGNOSTIC FINDINGS:   12/08/2022 DG Foot Complete: IMPRESSION: 1. New curvilinear lucencies throughout the proximal 25% of the distal phalanx of the great toe, an acute to subacute comminuted fracture with intra-articular extension. 2. Apparent chronic soft tissue ulcer at the plantar aspect of the forefoot at the level of the distal metatarsals, possibly at the great toe metatarsal head, similar to 01/18/2022. No definite cortical erosion.  COGNITION: Overall cognitive status: Within functional limits for tasks assessed   SENSATION: Decreased LE changes related to diabetic neuropathy and bilateral LE  POSTURE: rounded shoulders and forward head  LOWER EXTREMITY ROM:  Decreased ankle dorsiflexion and hip extension noted with functional testing  LOWER EXTREMITY MMT:  MMT Right eval Left eval  Hip flexion 3/5 3+/5  Hip extension    Hip abduction    Hip adduction  Hip internal rotation    Hip external rotation    Knee flexion 3/5 3+/5  Knee extension 3/5 3+/5  Ankle dorsiflexion  3+/5  Ankle plantarflexion    Ankle inversion    Ankle eversion    (Blank rows = not tested)  BED MOBILITY:  Patient reports that he is able to do so independently  TRANSFERS: Sit to stand: SBA Stand to sit: SBA Strong reliance on arms for transfers  GAIT: Gait pattern: decreased step length- Left, decreased stance time- Right, decreased stance time- Left, decreased stride length, trendelenburg, and wide BOS Distance walked: 2 x 80 feet Assistive device utilized:  SPC with quad rubber tip Level of assistance: SBA Prosthetic pistoning during stance.   FUNCTIONAL  TESTS:      CURRENT PROSTHETIC WEAR ASSESSMENT: Patient is independent with: ply sock cleaning, proper wear schedule/adjustment, and proper weight-bearing schedule/adjustment Patient is dependent with: skin check, residual limb care, and care of non-amputated limb Donning prosthesis: Complete Independence Doffing prosthesis: Complete Independence Prosthetic wear tolerance: 10 hours/day, 7 days/week Prosthetic weight bearing tolerance: 20-30 minutes Edema: no swelling noted Residual limb condition: redness noted behind knee on R side and slight redness noted on anterior aspect of knee superior to patellar pole, on left foot slight scap noted on healing scar, patient following up with podiatry this week Prosthetic description: Unknown will follow up with [facility], socket liner poor fit with gapping along thigh K code/activity level with prosthetic use: Will clarify with [facility]  PATIENT SURVEYS:  ABC scale : 58.1% Confidence   TODAY'S TREATMENT:                                                                                                                                         -Verbally reviewed HEP w/ pt, attempted to have patient demo exercises but he needs verbal prompting to demo.  He is able to perform when instructed and states they remain challenging.  Will not addend this visit per pt preference. -5xSTS:  20.94 seconds w/ intermittent touch assist - w/ SPC w/ rubber quad tip:  10.69 seconds = 0.94 m/sec OR 3.09 ft/sec -TUG w/ SPC w/ rubber quad tip:  17.28 seconds -RAMP:  Level of Assistance: SBA and CGA Assistive device utilized: Single point cane with rubber quad tip Ramp Comments: Performed x2 w/ performance varying depending on LE placement with patient having trouble with controlled descent today vs uphill last session on unlevel outdoor sidewalk.  -ABC scale:  48.75% -Reviewed rolling supine to quadruped w/ pt requiring demo and verbal cuing to roll to the left and tuck  knees into position.  TREATMENT (This is re-iterated every session):  PATIENT EDUCATED ON FOLLOWING PROSTHETIC CARE: Other education  Skin check - Educated on regular skin check (LEFT FOOT AND RESIDUAL LIMB) given past medical history, discussed techniques including use of mirror to assess difficult to see areas, recommended checking every  day, discussed skin changes noted in today's session  PATIENT EDUCATION: Education details:  Continue HEP.  Continue skin checks.  Plan for discharge with goal assessment today-pt verbalizes agreement today.  Podiatry follow-up if pt is able to afford-check into amputee financial resources previously provided.  Discussed rotating quad tip on cane for improved grip on weight-bearing side as one prong on weight-bearing side is worn down compared to others.  Progress towards goals. Person educated: Patient Education method: Explanation Education comprehension: verbalized understanding and needs further education  HOME EXERCISE PROGRAM: Access Code: [email or web address] URL: [email or web address] Date: 04/29/2023 Prepared by: Camille Bal  Exercises - Seated Ankle Inversion Eversion AROM  - 1 x daily - 5 x weekly - 2-3 sets - 10 reps - Sit to Stand with Armchair  - 1 x daily - 7 x weekly - 2 sets - 8 reps - Standing Balance in Corner with Eyes Closed  - 1 x daily - 5 x weekly - 3 sets - 10 reps - Standing March with Counter Support  - 1 x daily - 5 x weekly - 2 sets - 20 reps - Semi-Tandem Balance at [employer] Eyes Open  - 1 x daily - 5 x weekly - 1 sets - 2-3 reps - 45 seconds hold  ASSESSMENT:  CLINICAL IMPRESSION: Assess LTGs in preparation for discharge this session.  Single goal not met with all others being met or patient making progress towards.  Plan to discharge today due to maximized potential as patient is not open to more restrictive assistive device training for safety and he continues to be limited financially in regards to likely  beneficial new prosthetic liner and left foot custom orthotic to correct ongoing foot posture.  He was able to perform supine to quadruped today with return demonstration and increased time.  He is independent with HEP and did not desire updates today as he states he remains challenged.  His 5xSTS time improved from 27 to 20.94 seconds with intermittent touch assist demonstrating improved LE strength.  His gait speed improved from 0.16m/sec to 0.94 m/sec with SPC quad tip cane at SBA.  He TUG improved insignificantly to 17.28 sec with SPC quad tip cane at SBA level.  His ABC scale score decreased to 48.75% indicating a decrease in confidence in his balance despite subjective reports of improvement.  He would benefit from a 2-handed assistive device to better manage incline as his performance fluctuated from CGA to SBA for safety on ramp this visit.  Will discharge for stated reasons listed prior and patient is in agreement with plan.  He would benefit from episodic care in the future.  OBJECTIVE IMPAIRMENTS: Abnormal gait, decreased activity tolerance, decreased balance, decreased mobility, difficulty walking, decreased ROM, decreased strength, impaired sensation, prosthetic dependency , and pain.   ACTIVITY LIMITATIONS: carrying, squatting, transfers, and locomotion level  PARTICIPATION LIMITATIONS: shopping, community activity, and yard work  PERSONAL FACTORS: Social background and 3+ comorbidities: see above  are also affecting patient's functional outcome.   REHAB POTENTIAL: Good  CLINICAL DECISION MAKING: Evolving/moderate complexity  EVALUATION COMPLEXITY: Moderate   GOALS: Goals reviewed with patient? Yes  SHORT TERM GOALS: Target date: 05/04/2023  Patient will demonstrate 100% compliance with initial HEP to continue to progress between physical therapy sessions.   Baseline: Established and updated at last visit, pt reports compliance (6/12) Goal status: MET  2.  Assess ramp and write  goal as indicated Baseline: Assessed 5/28 and goal written. Goal status: MET   LONG  TERM GOALS: Target date: 05/25/2023  Patient will report demonstrate independence with final HEP in order to maintain current gains and continue to progress after physical therapy discharge.   Baseline: Established and pt requires paper for visualization or verbal cues to perform (7/5) Goal status: IN PROGRESS  2.  Patient will improve their 5x Sit to Stand score to less than 20 seconds with minimal required UE support to demonstrate a decreased risk for falls and improved LE strength.   Baseline: 27 sec without UE support; 20.94 seconds w/ intermittent touch assist (7/5) Goal status: IN PROGRESS  3.  Patient will improve gait speed to 0.9 m/s to indicate improvement to the level of community ambulator in order to participate more easily in activities outside of the home.   Baseline: 0.8 m/s with SPC quad tip cane (SBA); 0.94 m/sec with SPC quad tip cane (SBA) (7/5) Goal status: MET  4.  Patient will improve TUG score to 13 seconds to indicate clinically significant progress towards a decreased risk of falls and improved mobility.  Baseline: 18.39 sec with SPC quad tip cane (SBA); 17.28 sec with SPC quad tip cane (SBA) (7/5) Goal status: IN PROGRESS  5.  Patient will improve ABC Score to 67% or greater to indicate a decreased risk for falls and improved self-reported confidence in balance and sense of steadiness.   Baseline: 58.1%; 48.75% (7/5) Goal status: NOT MET  6.  Pt will ambulate up and down ramp incline no more than SBA using LRAD in order to improve unlevel surface management and functional safety. Baseline: CGA-minA w/ SPC w/ quad tip (5/28) SBA-CGA w/ SPC w/ rubber quad tip (7/5) Goal status: IN PROGRESS   PLAN:  PT FREQUENCY: 2x/week  PT DURATION: 6 weeks  PLANNED INTERVENTIONS: Therapeutic exercises, Therapeutic activity, Neuromuscular re-education, Balance training, Gait training,  Patient/Family education, Self Care, Joint mobilization, Manual therapy, and Re-evaluation  PLAN FOR NEXT SESSION: N/A   Sadie Haber, PT, DPT 05/28/2023, 2:44 PM

## 2023-05-28 NOTE — Telephone Encounter (Signed)
Patient dropped off form at front desk for [facility] Hand[employer]ap [employer]ac[employer].  Verified that patient section of form has been completed.  Last DOS/WCC with PCP was 05/18/23.  Placed form in blue team folder to be completed by clinical staff.  Vilinda Blanks

## 2023-05-28 NOTE — Telephone Encounter (Signed)
Patient walked in and provided blood pressure log. Log was placed in doctor's box.

## 2023-05-28 NOTE — Telephone Encounter (Signed)
Patient returns call to nurse line.   Advised of results per note from Dr. Laroy Apple.   Patient has no further questions at this time.   Veronda Prude, RN

## 2023-05-28 NOTE — Telephone Encounter (Signed)
Routed message to PCP. Driana Dazey, CMA  

## 2023-05-28 NOTE — Telephone Encounter (Signed)
Clinical info completed on DMV form.  Placed form in PCP's box for completion.    When form is completed, please route note to "RN Team" and place in wall pocket in front office.   Royetta Probus, CMA  

## 2023-05-29 LAB — LIPID PANEL
Chol/HDL Ratio: 2.5 ratio (ref 0.0–5.0)
Cholesterol, Total: 92 mg/dL — ABNORMAL LOW (ref 100–199)
HDL: 37 mg/dL — ABNORMAL LOW (ref >39)
LDL Chol Calc (NIH): 41 mg/dL (ref 0–99)
Triglycerides: 58 mg/dL (ref 0–149)
VLDL Cholesterol Cal: 14 mg/dL (ref 5–40)

## 2023-06-02 NOTE — Telephone Encounter (Signed)
Patient called, LVM and informed that forms are ready for pick up. Copy made and placed in batch scanning. Original placed at front desk for pick up.   Lorilei Horan C Berton Butrick, RN  

## 2023-06-03 ENCOUNTER — Encounter (HOSPITAL_COMMUNITY): Payer: 59

## 2023-06-04 ENCOUNTER — Encounter (HOSPITAL_COMMUNITY)
Admission: RE | Admit: 2023-06-04 | Discharge: 2023-06-04 | Disposition: A | Discharge status: Home / Self Care | Payer: 59 | Source: Ambulatory Visit | Attending: Nephrology | Admitting: Nephrology

## 2023-06-04 VITALS — BP 139/81 | HR 90 | Temp 97.5°F | Resp 18

## 2023-06-04 DIAGNOSIS — N179 Acute kidney failure, unspecified: Secondary | ICD-10-CM | POA: Diagnosis not present

## 2023-06-04 DIAGNOSIS — D649 Anemia, unspecified: Secondary | ICD-10-CM | POA: Diagnosis not present

## 2023-06-04 DIAGNOSIS — D62 Acute posthemorrhagic anemia: Secondary | ICD-10-CM | POA: Diagnosis not present

## 2023-06-04 LAB — IRON AND TIBC
Iron: 108 ug/dL (ref 45–182)
Saturation Ratios: 31 % (ref 17.9–39.5)
TIBC: 346 ug/dL (ref 250–450)
UIBC: 238 ug/dL

## 2023-06-04 LAB — POCT HEMOGLOBIN-HEMACUE: Hemoglobin: 11 g/dL — ABNORMAL LOW (ref 13.0–17.0)

## 2023-06-04 LAB — FERRITIN: Ferritin: 22 ng/mL — ABNORMAL LOW (ref 24–336)

## 2023-06-04 MED ORDER — EPOETIN ALFA-EPBX 10000 UNIT/ML IJ SOLN
20000.0000 [IU] | INTRAMUSCULAR | Status: DC
  Administered 2023-06-04: 20000 [IU] via SUBCUTANEOUS

## 2023-06-04 MED ORDER — EPOETIN ALFA-EPBX 10000 UNIT/ML IJ SOLN
INTRAMUSCULAR | Status: AC
  Filled 2023-06-04: qty 2

## 2023-06-07 ENCOUNTER — Other Ambulatory Visit: Payer: Self-pay | Admitting: Student

## 2023-06-07 DIAGNOSIS — R112 Nausea with vomiting, unspecified: Secondary | ICD-10-CM

## 2023-06-08 ENCOUNTER — Ambulatory Visit: Payer: 59 | Admitting: *Deleted

## 2023-06-08 DIAGNOSIS — Z0000 Encounter for general adult medical examination without abnormal findings: Secondary | ICD-10-CM | POA: Diagnosis not present

## 2023-06-08 NOTE — Progress Notes (Signed)
Subjective:   [name] [name] is a 52 y.o. male who presents for an Initial Medicare Annual Wellness Visit.  Visit Complete: Virtual  I connected with  [name] on 06/08/23 by a audio enabled telemedicine application and verified that I am speaking with the correct person using two identifiers.  Patient Location: Home  Provider Location: Home Office  I discussed the limitations of evaluation and management by telemedicine. The patient expressed understanding and agreed to proceed.    Review of Systems     Cardiac Risk Factors include: advanced age (>17men, >13 women);diabetes mellitus;hypertension;male gender;family history of premature cardiovascular disease;smoking/ tobacco exposure;sedentary lifestyle     Objective:    Today's Vitals   06/08/23 1404  PainSc: 8    There is no height or weight on file to calculate BMI.     06/08/2023    2:09 PM 05/18/2023    2:00 PM 04/13/2023   11:06 AM 03/01/2023    2:25 PM 02/22/2023    2:19 PM 02/15/2023    4:55 PM 11/06/2022    2:17 PM  Advanced Directives  Does Patient Have a Medical Advance Directive? No No No No No No No  Would patient like information on creating a medical advance directive? No - Patient declined  No - Patient declined No - Patient declined No - Patient declined No - Patient declined     Current Medications (verified) Outpatient Encounter Medications as of 06/08/2023  Medication Sig   acetaminophen (TYLENOL) 325 MG tablet Take 2 tablets (650 mg total) by mouth every 6 (six) hours.   AGAMATRIX ULTRA-THIN LANCETS MISC Check blood sugars before meals twice daily   atorvastatin (LIPITOR) 40 MG tablet Take 1 tablet (40 mg total) by mouth at bedtime.   baclofen (LIORESAL) 10 MG tablet 1/2 tab twice a day (Patient taking differently: Take 10 mg by mouth daily. 1/2 tab twice a day)   Blood Glucose Monitoring Suppl (ACCU-CHEK GUIDE ME) w/Device KIT See admin instructions.   calcitRIOL (ROCALTROL) 0.5 MCG  capsule Take 0.5 mcg by mouth in the morning and at bedtime.   diltiazem (CARDIZEM CD) 300 MG 24 hr capsule TAKE 1 CAPSULE(300 MG) BY MOUTH AT BEDTIME   FARXIGA 10 MG TABS tablet Take 1 tablet (10 mg total) by mouth daily.   furosemide (LASIX) 40 MG tablet Take 40 mg by mouth daily.   gabapentin (NEURONTIN) 300 MG capsule TAKE 1 CAPSULE(300 MG) BY MOUTH DAILY   glucose blood (AGAMATRIX PRESTO TEST) test strip Check blood sugars twice daily before meals   glucose blood test strip 1 each by Other route as needed for other. Use as instructed   HUMALOG KWIKPEN 100 UNIT/ML KwikPen Inject 6 Units into the skin 2 (two) times daily before lunch and supper. Dose per sliding scale   hydrALAZINE (APRESOLINE) 50 MG tablet Take 1.5 tablets (75 mg total) by mouth 3 (three) times daily.   insulin degludec (TRESIBA FLEXTOUCH) 100 UNIT/ML FlexTouch Pen Inject 20 Units into the skin daily.   Insulin Disposable Pump (OMNIPOD DASH PODS, GEN 4,) MISC    metoprolol succinate (TOPROL XL) 50 MG 24 hr tablet Take 1 tablet (50 mg total) by mouth daily.   ondansetron (ZOFRAN-ODT) 8 MG disintegrating tablet DISSOLVE 1 TABLET(8 MG) ON THE TONGUE EVERY 8 HOURS AS NEEDED FOR NAUSEA OR VOMITING   pantoprazole (PROTONIX) 40 MG tablet Take 40 mg by mouth daily.   No facility-administered encounter medications on file as of 06/08/2023.  Allergies (verified) Iron and Tape   History: 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)    Depression    Diabetes mellitus without complication (HCC) 2019   GERD (gastroesophageal reflux disease)    Hyperlipidemia    Hypertension    Necrotizing fasciitis (HCC) 10/13/2017   Neuromuscular disorder (HCC)    neuropathy   PAD (peripheral artery disease) (HCC)    Secondary hyperparathyroidism (HCC)     Wound dehiscence 11/24/2017   Past Surgical History:  Procedure Laterality Date   AMPUTATION Right 10/13/2017   Procedure: RIGHT BELOW KNEE AMPUTATION;  Surgeon: Nadara Mustard, MD;  Location: [facility] OR;  Service: Orthopedics;  Laterality: Right;   AMPUTATION Right 11/24/2017   Procedure: AMPUTATION BELOW KNEE REVISION;  Surgeon: Nadara Mustard, MD;  Location: [facility] OR;  Service: Orthopedics;  Laterality: Right;   AMPUTATION TOE Left    BIOPSY  09/02/2021   Procedure: BIOPSY;  Surgeon: Imogene Burn, MD;  Location: [facility] ENDOSCOPY;  Service: Gastroenterology;;   BIOPSY  02/18/2023   Procedure: BIOPSY;  Surgeon: Benancio Deeds, MD;  Location: [facility] ENDOSCOPY;  Service: Gastroenterology;;   COLONOSCOPY  05/11/2019   ESOPHAGOGASTRODUODENOSCOPY (EGD) WITH PROPOFOL N/A 09/02/2021   Procedure: ESOPHAGOGASTRODUODENOSCOPY (EGD) WITH PROPOFOL;  Surgeon: Imogene Burn, MD;  Location: [facility] ENDOSCOPY;  Service: Gastroenterology;  Laterality: N/A;   ESOPHAGOGASTRODUODENOSCOPY (EGD) WITH PROPOFOL N/A 02/18/2023   Procedure: ESOPHAGOGASTRODUODENOSCOPY (EGD) WITH PROPOFOL;  Surgeon: Benancio Deeds, MD;  Location: [facility] ENDOSCOPY;  Service: Gastroenterology;  Laterality: N/A;   NO PAST SURGERIES     POLYPECTOMY     WISDOM TOOTH EXTRACTION     Family History  Problem Relation Age of Onset   Diabetes Mellitus II Mother    Depression Mother    Colon polyps Mother    Hypertension Mother    High Cholesterol Mother    Heart disease Father        CABG x 4.  04/2017   Diabetes Father    High Cholesterol Father    Hypertension Father    Heart disease Maternal Grandmother    Heart disease Maternal Grandfather    Colon cancer Neg Hx    Esophageal cancer Neg Hx    Liver cancer Neg Hx    Pancreatic cancer Neg Hx    Rectal cancer Neg Hx    Stomach cancer Neg Hx    Social History   Socioeconomic History   Marital status: Divorced    Spouse name: Not on file   Number of children: 1   Years of education:  12   Highest education level: 12th grade  Occupational History   Occupation: unemployed  Tobacco Use   Smoking status: Every Day    Current packs/day: 0.50    Average packs/day: 0.5 packs/day for 35.0 years (17.5 ttl pk-yrs)    Types: Cigarettes    Start date: 11/23/1988   Smokeless tobacco: Never   Tobacco comments:    Uninterested - 0/10  importance of quitting 02/2023  Vaping Use   Vaping status: Never Used  Substance and Sexual Activity   Alcohol use: No   Drug use: No   Sexual activity: Not Currently  Other Topics Concern   Not on file  Social History Narrative   Originally from  [place].  Is living with his mother, who essentially supports him now.   Social Determinants of Health   Financial Resource Strain: Medium Risk (06/08/2023)   Overall Financial Resource Strain (CARDIA)    Difficulty of Paying Living Expenses: Somewhat hard  Food Insecurity: Food Insecurity Present (06/08/2023)   Hunger Vital Sign    Worried About Running Out of Food in the Last Year: Often true    Ran Out of Food in the Last Year: Sometimes true  Transportation Needs: No Transportation Needs (02/21/2023)   PRAPARE - Administrator, Civil Service (Medical): No    Lack of Transportation (Non-Medical): No  Physical Activity: Inactive (06/08/2023)   Exercise Vital Sign    Days of Exercise per Week: 0 days    Minutes of Exercise per Session: 0 min  Stress: Stress Concern Present (06/08/2023)   [employer] of Occupational Health - Occupational Stress Questionnaire    Feeling of Stress : Very much  Social Connections: Socially Isolated (06/08/2023)   Social Connection and Isolation Panel [NHANES]    Frequency of Communication with Friends and Family: Once a week    Frequency of Social Gatherings with Friends and Family: Never    Attends Religious Services: Never    Database administrator or Organizations: No    Attends Engineer, structural: Never    Marital Status:  Divorced    Tobacco Counseling Ready to quit: Not Answered Counseling given: Not Answered Tobacco comments: Uninterested - 0/10  importance of quitting 02/2023   Clinical Intake:  Pre-visit preparation completed: Yes  Pain : 0-10 Pain Score: 8  Pain Type: Acute pain Pain Location: Eye Pain Orientation: Left, Right Pain Descriptors / Indicators: Constant, Dull, Aching Pain Frequency: Intermittent     Diabetes: Yes CBG done?: No Did pt. bring in CBG monitor from home?: No  How often do you need to have someone help you when you read instructions, pamphlets, or other written materials from your doctor or pharmacy?: 2 - Rarely  Interpreter Needed?: No  Information entered by :: Remi Haggard LPN   Activities of Daily Living    06/08/2023    2:10 PM 09/11/2022    1:42 PM  In your present state of health, do you have any difficulty performing the following activities:  Hearing? 0 0  Vision? 0 0  Difficulty concentrating or making decisions? 1 0  Walking or climbing stairs? 1 1  Dressing or bathing? 0 0  Doing errands, shopping? 0   Preparing Food and eating ? N   Using the Toilet? N   In the past six months, have you accidently leaked urine? N   Do you have problems with loss of bowel control? N   Managing your Medications? N   Managing your Finances? N   Housekeeping or managing your Housekeeping? N     Patient Care Team: [name], MD as PCP - General (Family Medicine)  Indicate any recent Medical Services you may have received from other than [facility] providers in the past year (date may be approximate).     Assessment:   This is a routine wellness examination for [place].  Hearing/Vision screen Hearing Screening - Comments:: No trouble hearing Vision Screening - Comments:: Seeing one tomorrow Unsure of name  Dietary issues and exercise activities discussed:     Goals Addressed             This Visit's Progress    Patient Stated  No  goals       Depression Screen    06/08/2023    2:16 PM 05/18/2023    2:24 PM 03/01/2023    2:25 PM 02/22/2023    2:18 PM  PHQ 2/9 Scores  PHQ - 2 Score 6 4 3 2   PHQ- 9 Score 16 15 12 6     Fall Risk    06/08/2023    2:08 PM  Fall Risk   Falls in the past year? 1  Number falls in past yr: 0  Injury with Fall? 1  Risk for fall due to : Impaired balance/gait  Follow up Falls evaluation completed;Education provided;Falls prevention discussed    MEDICARE RISK AT HOME:  Medicare Risk at Home - 06/08/23 1409     Any stairs in or around the home? Yes    If so, are there any without handrails? No    Home free of loose throw rugs in walkways, pet beds, electrical cords, etc? Yes    Adequate lighting in your home to reduce risk of falls? Yes    Life alert? No    Use of a cane, walker or w/c? Yes    Grab bars in the bathroom? Yes    Shower chair or bench in shower? Yes    Elevated toilet seat or a handicapped toilet? No             TIMED UP AND GO:  Was the test performed? No    Cognitive Function:        06/08/2023    2:11 PM  6CIT Screen  What Year? 0 points  What month? 0 points  What time? 0 points  Count back from 20 0 points  Months in reverse 2 points  Repeat phrase 8 points  Total Score 10 points    Immunizations Immunization History  Administered Date(s) Administered   COVID-19, mRNA, vaccine(Comirnaty)12 years and older 11/06/2022   Influenza,inj,Quad PF,6+ Mos 11/06/2022   PFIZER(Purple Top)SARS-COV-2 Vaccination 02/25/2020, 03/17/2020, 09/15/2020   PNEUMOCOCCAL CONJUGATE-20 11/06/2022   Tdap 04/02/2020    TDAP status: Due, Education has been provided regarding the importance of this vaccine. Advised may receive this vaccine at local pharmacy or Health Dept. Aware to provide a copy of the vaccination record if obtained from local pharmacy or Health Dept. Verbalized acceptance and understanding.  Flu Vaccine status: Up to date    Covid-19  vaccine status: Information provided on how to obtain vaccines.   Qualifies for Shingles Vaccine? Yes   Zostavax completed No   Shingrix Completed?: No.    Education has been provided regarding the importance of this vaccine. Patient has been advised to call insurance company to determine out of pocket expense if they have not yet received this vaccine. Advised may also receive vaccine at local pharmacy or Health Dept. Verbalized acceptance and understanding.  Screening Tests Health Maintenance  Topic Date Due   Zoster Vaccines- Shingrix (1 of 2) Never done   Diabetic kidney evaluation - Urine ACR  02/16/2019   COVID-19 Vaccine (5 - 2023-24 season) 06/24/2023 (Originally 01/01/2023)   Colonoscopy  06/07/2024 (Originally 05/25/2023)   INFLUENZA VACCINE  06/24/2023   OPHTHALMOLOGY EXAM  09/10/2023   FOOT EXAM  10/08/2023   HEMOGLOBIN A1C  11/17/2023   Diabetic kidney evaluation - eGFR measurement  05/17/2024   Medicare Annual Wellness (AWV)  06/07/2024   DTaP/Tdap/Td (2 - Td or Tdap) 04/02/2030   Hepatitis C Screening  Completed   HIV Screening  Completed  HPV VACCINES  Aged Out    Health Maintenance  Health Maintenance Due  Topic Date Due   Zoster Vaccines- Shingrix (1 of 2) Never done   Diabetic kidney evaluation - Urine ACR  02/16/2019    Colonoscopy met with GI with his risk factors it was declined  Lung Cancer Screening: (Low Dose CT Chest recommended if Age 29-80 years, 20 pack-year currently smoking OR have quit w/in 15years.) does not qualify.   Lung Cancer Screening Referral:   Additional Screening:  Hepatitis C Screening: does not qualify; Completed 2024  Vision Screening: Recommended annual ophthalmology exams for early detection of glaucoma and other disorders of the eye. Is the patient up to date with their annual eye exam?  Yes  Who is the provider or what is the name of the office in which the patient attends annual eye exams? Unsure of name If pt is not  established with a provider, would they like to be referred to a provider to establish care? No .   Dental Screening: Recommended annual dental exams for proper oral hygiene Nutrition Risk Assessment:  Has the patient had any N/V/D within the last 2 months?  yes Does the patient have any non-healing wounds?  No  Has the patient had any unintentional weight loss or weight gain?  No   Diabetes:  Is the patient diabetic?  Yes  If diabetic, was a CBG obtained today?  No  Did the patient bring in their glucometer from home?  No  How often do you monitor your CBG's? 4-5 times a day.   Financial Strains and Diabetes Management:  Are you having any financial strains with the device, your supplies or your medication? No .  Does the patient want to be seen by Chronic Care Management for management of their diabetes?  No  Would the patient like to be referred to a Nutritionist or for Diabetic Management?  No   Diabetic Exams:  Diabetic Eye Exam: Completed  Pt has been advised about the importance in completing this exam. A referral has been placed today. Message sent to referral coordinator for scheduling purposes. Advised pt to expect a call from office referred to regarding appt.  Diabetic Foot Exam:  Pt has been advised about the importance in completing this exam.   Community Resource Referral / Chronic Care Management: CRR required this visit?  No   CCM required this visit?  No    Plan:     I have personally reviewed and noted the following in the patient's chart:   Medical and social history Use of alcohol, tobacco or illicit drugs  Current medications and supplements including opioid prescriptions. Patient is not currently taking opioid prescriptions. Functional ability and status Nutritional status Physical activity Advanced directives List of other physicians Hospitalizations, surgeries, and ER visits in previous 12 months Vitals Screenings to include cognitive,  depression, and falls Referrals and appointments  In addition, I have reviewed and discussed with patient certain preventive protocols, quality metrics, and best practice recommendations. A written personalized care plan for preventive services as well as general preventive health recommendations were provided to patient.     Remi Haggard, LPN   03/01/8118   After Visit Summary: (MyChart) Due to this being a telephonic visit, the after visit summary with patients personalized plan was offered to patient via MyChart   Nurse Notes:   patient stated he is being seen tomorrow for the pain in his eyes.   He states he has not  lost any vision but has a dull pain.

## 2023-06-08 NOTE — Patient Instructions (Signed)
[name] [name] , Thank you for taking time to come for your Medicare Wellness Visit. I appreciate your ongoing commitment to your health goals. Please review the following plan we discussed and let me know if I can assist you in the future.   Screening recommendations/referrals: Colonoscopy: discussed with GI due to medical conditions no Colonoscopy Recommended yearly ophthalmology/optometry visit for glaucoma screening and checkup Recommended yearly dental visit for hygiene and checkup  Vaccinations: Influenza vaccine: Education provided Tdap vaccine: Education provided Shingles vaccine: Education provided    Advanced directives: Education provided    Preventive Care 40-64 Years, Male Preventive care refers to lifestyle choices and visits with your health care provider that can promote health and wellness. What does preventive care include? A yearly physical exam. This is also called an annual well check. Dental exams once or twice a year. Routine eye exams. Ask your health care provider how often you should have your eyes checked. Personal lifestyle choices, including: Daily care of your teeth and gums. Regular physical activity. Eating a healthy diet. Avoiding tobacco and drug use. Limiting alcohol use. Practicing safe sex. Taking low-dose aspirin every day starting at age 83. What happens during an annual well check? The services and screenings done by your health care provider during your annual well check will depend on your age, overall health, lifestyle risk factors, and family history of disease. Counseling  Your health care provider may ask you questions about your: Alcohol use. Tobacco use. Drug use. Emotional well-being. Home and relationship well-being. Sexual activity. Eating habits. Work and work Astronomer. Screening  You may have the following tests or measurements: Height, weight, and BMI. Blood pressure. Lipid and cholesterol levels. These may be  checked every 5 years, or more frequently if you are over 56 years old. Skin check. Lung cancer screening. You may have this screening every year starting at age 42 if you have a 30-pack-year history of smoking and currently smoke or have quit within the past 15 years. Fecal occult blood test (FOBT) of the stool. You may have this test every year starting at age 51. Flexible sigmoidoscopy or colonoscopy. You may have a sigmoidoscopy every 5 years or a colonoscopy every 10 years starting at age 14. Prostate cancer screening. Recommendations will vary depending on your family history and other risks. Hepatitis C blood test. Hepatitis B blood test. Sexually transmitted disease (STD) testing. Diabetes screening. This is done by checking your blood sugar (glucose) after you have not eaten for a while (fasting). You may have this done every 1-3 years. Discuss your test results, treatment options, and if necessary, the need for more tests with your health care provider. Vaccines  Your health care provider may recommend certain vaccines, such as: Influenza vaccine. This is recommended every year. Tetanus, diphtheria, and acellular pertussis (Tdap, Td) vaccine. You may need a Td booster every 10 years. Zoster vaccine. You may need this after age 56. Pneumococcal 13-valent conjugate (PCV13) vaccine. You may need this if you have certain conditions and have not been vaccinated. Pneumococcal polysaccharide (PPSV23) vaccine. You may need one or two doses if you smoke cigarettes or if you have certain conditions. Talk to your health care provider about which screenings and vaccines you need and how often you need them. This information is not intended to replace advice given to you by your health care provider. Make sure you discuss any questions you have with your health care provider. Document Released: 12/06/2015 Document Revised: 07/29/2016 Document Reviewed: 09/10/2015 Elsevier  Interactive Patient  Education  2017 [employer].  Fall Prevention in the Home Falls can cause injuries. They can happen to people of all ages. There are many things you can do to make your home safe and to help prevent falls. What can I do on the outside of my home? Regularly fix the edges of walkways and driveways and fix any cracks. Remove anything that might make you trip as you walk through a door, such as a raised step or threshold. Trim any bushes or trees on the path to your home. Use bright outdoor lighting. Clear any walking paths of anything that might make someone trip, such as rocks or tools. Regularly check to see if handrails are loose or broken. Make sure that both sides of any steps have handrails. Any raised decks and porches should have guardrails on the edges. Have any leaves, snow, or ice cleared regularly. Use sand or salt on walking paths during winter. Clean up any spills in your garage right away. This includes oil or grease spills. What can I do in the bathroom? Use night lights. Install grab bars by the toilet and in the tub and shower. Do not use towel bars as grab bars. Use non-skid mats or decals in the tub or shower. If you need to sit down in the shower, use a plastic, non-slip stool. Keep the floor dry. Clean up any water that spills on the floor as soon as it happens. Remove soap buildup in the tub or shower regularly. Attach bath mats securely with double-sided non-slip rug tape. Do not have throw rugs and other things on the floor that can make you trip. What can I do in the bedroom? Use night lights. Make sure that you have a light by your bed that is easy to reach. Do not use any sheets or blankets that are too big for your bed. They should not hang down onto the floor. Have a firm chair that has side arms. You can use this for support while you get dressed. Do not have throw rugs and other things on the floor that can make you trip. What can I do in the  kitchen? Clean up any spills right away. Avoid walking on wet floors. Keep items that you use a lot in easy-to-reach places. If you need to reach something above you, use a strong step stool that has a grab bar. Keep electrical cords out of the way. Do not use floor polish or wax that makes floors slippery. If you must use wax, use non-skid floor wax. Do not have throw rugs and other things on the floor that can make you trip. What can I do with my stairs? Do not leave any items on the stairs. Make sure that there are handrails on both sides of the stairs and use them. Fix handrails that are broken or loose. Make sure that handrails are as long as the stairways. Check any carpeting to make sure that it is firmly attached to the stairs. Fix any carpet that is loose or worn. Avoid having throw rugs at the top or bottom of the stairs. If you do have throw rugs, attach them to the floor with carpet tape. Make sure that you have a light switch at the top of the stairs and the bottom of the stairs. If you do not have them, ask someone to add them for you. What else can I do to help prevent falls? Wear shoes that: Do not have high  heels. Have rubber bottoms. Are comfortable and fit you well. Are closed at the toe. Do not wear sandals. If you use a stepladder: Make sure that it is fully opened. Do not climb a closed stepladder. Make sure that both sides of the stepladder are locked into place. Ask someone to hold it for you, if possible. Clearly mark and make sure that you can see: Any grab bars or handrails. First and last steps. Where the edge of each step is. Use tools that help you move around (mobility aids) if they are needed. These include: Canes. Walkers. Scooters. Crutches. Turn on the lights when you go into a dark area. Replace any light bulbs as soon as they burn out. Set up your furniture so you have a clear path. Avoid moving your furniture around. If any of your floors are  uneven, fix them. If there are any pets around you, be aware of where they are. Review your medicines with your doctor. Some medicines can make you feel dizzy. This can increase your chance of falling. Ask your doctor what other things that you can do to help prevent falls. This information is not intended to replace advice given to you by your health care provider. Make sure you discuss any questions you have with your health care provider. Document Released: 09/05/2009 Document Revised: 04/16/2016 Document Reviewed: 12/14/2014 Elsevier Interactive Patient Education  2017 [employer].

## 2023-06-09 DIAGNOSIS — E113592 Type 2 diabetes mellitus with proliferative diabetic retinopathy without macular edema, left eye: Secondary | ICD-10-CM | POA: Diagnosis not present

## 2023-06-13 DIAGNOSIS — Z794 Long term (current) use of insulin: Secondary | ICD-10-CM | POA: Diagnosis not present

## 2023-06-13 DIAGNOSIS — E118 Type 2 diabetes mellitus with unspecified complications: Secondary | ICD-10-CM | POA: Diagnosis not present

## 2023-06-15 DIAGNOSIS — N185 Chronic kidney disease, stage 5: Secondary | ICD-10-CM | POA: Diagnosis not present

## 2023-06-17 ENCOUNTER — Encounter (HOSPITAL_COMMUNITY): Payer: 59

## 2023-06-17 ENCOUNTER — Encounter (HOSPITAL_COMMUNITY)
Admission: RE | Admit: 2023-06-17 | Discharge: 2023-06-17 | Disposition: A | Discharge status: Home / Self Care | Payer: 59 | Source: Ambulatory Visit | Attending: Nephrology | Admitting: Nephrology

## 2023-06-17 VITALS — BP 158/97 | HR 74 | Temp 97.2°F | Resp 18

## 2023-06-17 DIAGNOSIS — I120 Hypertensive chronic kidney disease with stage 5 chronic kidney disease or end stage renal disease: Secondary | ICD-10-CM | POA: Diagnosis not present

## 2023-06-17 DIAGNOSIS — R809 Proteinuria, unspecified: Secondary | ICD-10-CM | POA: Diagnosis not present

## 2023-06-17 DIAGNOSIS — I739 Peripheral vascular disease, unspecified: Secondary | ICD-10-CM | POA: Diagnosis not present

## 2023-06-17 DIAGNOSIS — N185 Chronic kidney disease, stage 5: Secondary | ICD-10-CM | POA: Diagnosis not present

## 2023-06-17 DIAGNOSIS — N179 Acute kidney failure, unspecified: Secondary | ICD-10-CM | POA: Diagnosis not present

## 2023-06-17 DIAGNOSIS — R296 Repeated falls: Secondary | ICD-10-CM | POA: Diagnosis not present

## 2023-06-17 DIAGNOSIS — N2581 Secondary hyperparathyroidism of renal origin: Secondary | ICD-10-CM | POA: Diagnosis not present

## 2023-06-17 DIAGNOSIS — D649 Anemia, unspecified: Secondary | ICD-10-CM | POA: Diagnosis not present

## 2023-06-17 DIAGNOSIS — D62 Acute posthemorrhagic anemia: Secondary | ICD-10-CM

## 2023-06-17 DIAGNOSIS — D631 Anemia in chronic kidney disease: Secondary | ICD-10-CM | POA: Diagnosis not present

## 2023-06-17 DIAGNOSIS — R339 Retention of urine, unspecified: Secondary | ICD-10-CM | POA: Diagnosis not present

## 2023-06-17 MED ORDER — EPOETIN ALFA-EPBX 10000 UNIT/ML IJ SOLN
INTRAMUSCULAR | Status: AC
  Administered 2023-06-17: 20000 [IU] via SUBCUTANEOUS
  Filled 2023-06-17: qty 2

## 2023-06-17 MED ORDER — EPOETIN ALFA-EPBX 10000 UNIT/ML IJ SOLN: 20000.0000 [IU] | INTRAMUSCULAR | Status: DC

## 2023-06-18 LAB — POCT HEMOGLOBIN-HEMACUE: Hemoglobin: 11.1 g/dL — ABNORMAL LOW (ref 13.0–17.0)

## 2023-07-01 ENCOUNTER — Encounter (HOSPITAL_COMMUNITY)
Admission: RE | Admit: 2023-07-01 | Discharge: 2023-07-01 | Disposition: A | Discharge status: Home / Self Care | Payer: 59 | Source: Ambulatory Visit | Attending: Nephrology | Admitting: Nephrology

## 2023-07-01 VITALS — BP 137/86 | HR 87 | Temp 97.5°F | Resp 18

## 2023-07-01 DIAGNOSIS — D649 Anemia, unspecified: Secondary | ICD-10-CM | POA: Insufficient documentation

## 2023-07-01 DIAGNOSIS — D62 Acute posthemorrhagic anemia: Secondary | ICD-10-CM | POA: Insufficient documentation

## 2023-07-01 DIAGNOSIS — N179 Acute kidney failure, unspecified: Secondary | ICD-10-CM | POA: Insufficient documentation

## 2023-07-01 LAB — IRON AND TIBC
Iron: 76 ug/dL (ref 45–182)
Saturation Ratios: 22 % (ref 17.9–39.5)
TIBC: 349 ug/dL (ref 250–450)
UIBC: 273 ug/dL

## 2023-07-01 LAB — FERRITIN: Ferritin: 14 ng/mL — ABNORMAL LOW (ref 24–336)

## 2023-07-01 LAB — POCT HEMOGLOBIN-HEMACUE: Hemoglobin: 11.5 g/dL — ABNORMAL LOW (ref 13.0–17.0)

## 2023-07-01 MED ORDER — EPOETIN ALFA-EPBX 10000 UNIT/ML IJ SOLN
INTRAMUSCULAR | Status: AC
  Administered 2023-07-01: 20000 [IU] via SUBCUTANEOUS
  Filled 2023-07-01: qty 2

## 2023-07-01 MED ORDER — EPOETIN ALFA-EPBX 10000 UNIT/ML IJ SOLN: 20000.0000 [IU] | INTRAMUSCULAR | Status: DC

## 2023-07-03 ENCOUNTER — Other Ambulatory Visit: Payer: Self-pay | Admitting: Student

## 2023-07-03 DIAGNOSIS — I10 Essential (primary) hypertension: Secondary | ICD-10-CM

## 2023-07-06 ENCOUNTER — Telehealth: Payer: Self-pay

## 2023-07-06 NOTE — Telephone Encounter (Signed)
Patient calls nurse line in regards to lab results.   He reports he had labs drawn for a clinical trial he found on Facebook on 8/09.  He reports he received the results back and has concerns about his Creatinine level of 5.63.  Patient advised to schedule an apt with PCP.   Patient scheduled for 8/30.

## 2023-07-13 DIAGNOSIS — Z794 Long term (current) use of insulin: Secondary | ICD-10-CM | POA: Diagnosis not present

## 2023-07-13 DIAGNOSIS — E118 Type 2 diabetes mellitus with unspecified complications: Secondary | ICD-10-CM | POA: Diagnosis not present

## 2023-07-15 ENCOUNTER — Encounter (HOSPITAL_COMMUNITY)
Admission: RE | Admit: 2023-07-15 | Discharge: 2023-07-15 | Disposition: A | Discharge status: Home / Self Care | Payer: 59 | Source: Ambulatory Visit | Attending: Nephrology | Admitting: Nephrology

## 2023-07-15 VITALS — BP 130/80 | HR 88 | Temp 97.9°F | Resp 18

## 2023-07-15 DIAGNOSIS — D62 Acute posthemorrhagic anemia: Secondary | ICD-10-CM

## 2023-07-15 DIAGNOSIS — D649 Anemia, unspecified: Secondary | ICD-10-CM | POA: Diagnosis not present

## 2023-07-15 DIAGNOSIS — N179 Acute kidney failure, unspecified: Secondary | ICD-10-CM | POA: Diagnosis not present

## 2023-07-15 LAB — POCT HEMOGLOBIN-HEMACUE: Hemoglobin: 11.5 g/dL — ABNORMAL LOW (ref 13.0–17.0)

## 2023-07-15 MED ORDER — EPOETIN ALFA-EPBX 10000 UNIT/ML IJ SOLN
20000.0000 [IU] | INTRAMUSCULAR | Status: DC
  Administered 2023-07-15: 20000 [IU] via SUBCUTANEOUS

## 2023-07-15 MED ORDER — EPOETIN ALFA-EPBX 10000 UNIT/ML IJ SOLN
INTRAMUSCULAR | Status: AC
  Filled 2023-07-15: qty 2

## 2023-07-18 NOTE — Progress Notes (Signed)
Cardiology Office Note Date:  07/18/2023  Patient ID:  [name] [name], [name] [name] 11/03/1971, MRN [id number] PCP:  [name], MD  Cardiologist:  Dr. Clifton James Electrophysiologist: Dr. Elberta Fortis Nephrology: [facility], Dr. Malen Gauze Endocrinology: Benita Stabile, FNP     Chief Complaint:     3 mo  History of Present Illness: [name] [name] is a 52 y.o. 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  He was referred to Dr. Clifton James April 2023 for palpitations, planned for monitoring and an echo  Monitor noted an SVT and recommended increasing his metoprolol and EP referral for possible ablation Turns out his PMD had already increased his metoprolol and no further changes made.  He saw Dr. Elberta Fortis 05/19/22, aware of on/off fast rates, otherwise no particular symptoms reported.  He wanted to try and avoid procedures, started on diltiazem, stopped his metoprolol.  ER visit 06/25/22 for a cyst/swelling L chest wall, (reportedly drained some years prior). Creat worse, discussed with his nepgrologist, Dr. Malen Gauze who has been seeing him regularly and likely pending need for HD, though if lytes were ok, no need to admit. CT chest noted mass/lesion, (not new noted on prior CTs) and radiology recommended MRI to better characterize. With some erythema given a course of antibiotics and discharged with close f/u with renal especially and PMD to discuss f/u on mass/MRI recs.  I saw him 06/30/22 His palpitations are less often/shorter but not gone. Mostly noted in the evenings in bed. They can last a couple minutes, he is aware of the fast rate but not particularly symptomatic otherwise. No CP No near syncope or syncope. No SOB While here his Dexcom alerts to BS of 67 and declining but feeling OK. He was given a cola and graham crackers with an upwards trend after a couple minutes, still feeling  well He follows closely with endo, has a insulin pump, infrequently with low readings His diltiazem was changed to PM dosing given palpitations seemed most often to bother him in the evenings.  Diltiazem was ultimately increased to 300mg  with ongoing palpitations  Admitted 09/10/22 for acute hypoxic respiratory failure, found with Rhinovirus Given Abx, steroid taper Discharged 09/12/22  LABS 09/12/22 K+ 4.0 BUN/Creat 77/5.52 (baseline looks 3-4's > 5) WBC 10.2 H/H 8.4/27  Baseline Hgb looks about  9-10) Plts 192  I saw him 09/2022 He is doing better. Initially denies any ongoing palpitations, though towards the end of our visit, again mentions palpitations/fast rates when in bed at night, lasting several minutes with a sense of racing beats.   No other associated symptoms No CP, denies SOB No dizzy spells, near syncope or syncope. He reports some edema since his hospital stay, doesn't think the Flomax works well for him.  C/w + urine OP. He reports that he takes lasix BID (not daily), has been for several months at the direction of Dr. Malen Gauze He follows with Dr. Malen Gauze, see her in December, there have been discussions he says about dialysis, though not a final decision/recommendation yet. Adjusted timing of his dilt with some continued symptoms/SVts   01/14/2023 at [facility] for L foot osteomyelitis and underwent a L  partial ray amputation on 01/16/2023 by podiatry then discharged on 3-week regimen of ciprofloxacin and Zyvox.  Discharged 01/20/23  02/12/2023 he presented to the [facility] ED again due to a near syncopal event; however after evaluation it was noted this was likely due  to to severe dehydration as the patient was not drinking water and has known end-stage renal disease not on dialysis.  He was given IV fluids with improvement and discharged.     Admitted 02/15/23 w/ complaints of SOB, palpitations, reported increasing frequency since his BKA of his tachycardia, he was found  to be severely anemic with a hemoglobin of 6.8 and transfused with 1 unit of PRBCs and subsequently 2 more units during the night, getting him to  8.4  VQ scan negative for PE, was  hypertensive with blood pressures ranging from 160-190 systolic with history of uncontrolled BP  EKG felt to represent AFlutter, had spontaneous CV to SR Mild HS trop elevation felt to be demand. 02/18/23, maintaining SR, in d/w Dr. Elberta Fortis, planned for out patent monitoring and decisions about a/c or not pending GI findings and w/u. Cards s/o noted no cause for his anemia on EGD  Discharged 02/18/27 Colonoscopy determined to be low yield given challenging prep. Patient underwent EGD on 3/29 which showed normal stomach duodenum. Ruled out GI etiology for anemia. Most likely this is secondary to chronic disease. GI to follow-up outpatient.   Out pt Monitoring: Predominant rhythm was sinus rhythm 752 SVT episodes, longest and fastest 19.2 seconds average 144 bpm. Less than 1% ventricular and supraventricular ectopy Triggered episodes associated with sinus tachycardia   I saw him 04/02/23 He feels palpitations, no associated dizziness, but bother him No CP No SOB No near syncope or syncope. He saw Dr. Malen Gauze yesterday He is on Farxiga confirmed with her yesterday , had labs yesterday as well He never started coreg, was unaware of the recommendation,never picked any rx up for it. He also was unaware of the increase of his hydralazine to TID and has continued BID Home BPs anywhere from 130's-170's/70's or so, better by my check He had never started Coerg so started on Toprol with plans to titrate as able Reviewed with Dr. Elberta Fortis, since not clearly AFlutter and GIB, not OAC yet Requested labs from Dr. Nena Polio in [facility] 03/26/23: BUN/Creat 58/5.89 K+ 4.5 Mag 2 Hgb 9.6  5/16 Hgb 9.6  I saw him 04/13/23 He has not noticed any improvement in his palpitations with the Toprol. Not any worse either, they can  last 3-5 minutes, no clear trigger or opattern No CP No near syncope or syncope. Mentions today walking in felt unusually SOB, fatigued today No bleeding or signs of bleeding BS by his dexcom is 155 EKG is SR 85bpm, BP recheck in 130/76 Toprol increased  TODAY He has been taking the Toprol 50mg  BID, it has seemed to help but continues with daily episodes of heart racing, less frequent Lasting a minute to a few minutes, making him feel weak and a little dizzy shen having them No CP Denies SOB He has noted that they are triggered with coughing. Continues to smoke, not particularly ready to look to given it up yet  No near syncope or syncope.    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)    Depression    Diabetes mellitus without complication (HCC) 2019   GERD (gastroesophageal reflux disease)    Hyperlipidemia    Hypertension    Necrotizing fasciitis (HCC) 10/13/2017   Neuromuscular disorder (HCC)    neuropathy  PAD (peripheral artery disease) (HCC)    Secondary hyperparathyroidism (HCC)    Wound dehiscence 11/24/2017    Past Surgical History:  Procedure Laterality Date   AMPUTATION Right 10/13/2017   Procedure: RIGHT BELOW KNEE AMPUTATION;  Surgeon: Nadara Mustard, MD;  Location: [facility] OR;  Service: Orthopedics;  Laterality: Right;   AMPUTATION Right 11/24/2017   Procedure: AMPUTATION BELOW KNEE REVISION;  Surgeon: Nadara Mustard, MD;  Location: [facility] OR;  Service: Orthopedics;  Laterality: Right;   AMPUTATION TOE Left    BIOPSY  09/02/2021   Procedure: BIOPSY;  Surgeon: Imogene Burn, MD;  Location: [facility] ENDOSCOPY;  Service: Gastroenterology;;   BIOPSY  02/18/2023   Procedure: BIOPSY;  Surgeon: Benancio Deeds, MD;  Location: [facility] ENDOSCOPY;  Service: Gastroenterology;;   COLONOSCOPY   05/11/2019   ESOPHAGOGASTRODUODENOSCOPY (EGD) WITH PROPOFOL N/A 09/02/2021   Procedure: ESOPHAGOGASTRODUODENOSCOPY (EGD) WITH PROPOFOL;  Surgeon: Imogene Burn, MD;  Location: [facility] ENDOSCOPY;  Service: Gastroenterology;  Laterality: N/A;   ESOPHAGOGASTRODUODENOSCOPY (EGD) WITH PROPOFOL N/A 02/18/2023   Procedure: ESOPHAGOGASTRODUODENOSCOPY (EGD) WITH PROPOFOL;  Surgeon: Benancio Deeds, MD;  Location: [facility] ENDOSCOPY;  Service: Gastroenterology;  Laterality: N/A;   NO PAST SURGERIES     POLYPECTOMY     WISDOM TOOTH EXTRACTION      Current Outpatient Medications  Medication Sig Dispense Refill   acetaminophen (TYLENOL) 325 MG tablet Take 2 tablets (650 mg total) by mouth every 6 (six) hours.     AGAMATRIX ULTRA-THIN LANCETS MISC Check blood sugars before meals twice daily 100 each 11   atorvastatin (LIPITOR) 40 MG tablet Take 1 tablet (40 mg total) by mouth at bedtime. 90 tablet 0   baclofen (LIORESAL) 10 MG tablet 1/2 tab twice a day (Patient taking differently: Take 10 mg by mouth daily. 1/2 tab twice a day) 30 each 0   Blood Glucose Monitoring Suppl (ACCU-CHEK GUIDE ME) w/Device KIT See admin instructions.     calcitRIOL (ROCALTROL) 0.5 MCG capsule Take 0.5 mcg by mouth in the morning and at bedtime.     diltiazem (CARDIZEM CD) 300 MG 24 hr capsule TAKE 1 CAPSULE(300 MG) BY MOUTH AT BEDTIME 90 capsule 3   FARXIGA 10 MG TABS tablet Take 1 tablet (10 mg total) by mouth daily. 30 tablet 0   furosemide (LASIX) 40 MG tablet Take 40 mg by mouth daily.     gabapentin (NEURONTIN) 300 MG capsule TAKE 1 CAPSULE(300 MG) BY MOUTH DAILY 30 capsule 0   glucose blood (AGAMATRIX PRESTO TEST) test strip Check blood sugars twice daily before meals 100 each 12   glucose blood test strip 1 each by Other route as needed for other. Use as instructed     HUMALOG KWIKPEN 100 UNIT/ML KwikPen Inject 6 Units into the skin 2 (two) times daily before lunch and supper. Dose per sliding scale 15 mL 2   hydrALAZINE  (APRESOLINE) 50 MG tablet TAKE 1 AND 1/2 TABLETS(75 MG) BY MOUTH THREE TIMES DAILY 415 tablet 3   insulin degludec (TRESIBA FLEXTOUCH) 100 UNIT/ML FlexTouch Pen Inject 20 Units into the skin daily. 3 mL 11   Insulin Disposable Pump (OMNIPOD DASH PODS, GEN 4,) MISC      metoprolol succinate (TOPROL XL) 50 MG 24 hr tablet Take 1 tablet (50 mg total) by mouth daily. 90 tablet 3   ondansetron (ZOFRAN-ODT) 8 MG disintegrating tablet DISSOLVE 1 TABLET(8 MG) ON THE TONGUE EVERY 8 HOURS AS NEEDED FOR NAUSEA OR VOMITING 20 tablet 0  pantoprazole (PROTONIX) 40 MG tablet Take 40 mg by mouth daily.     sevelamer carbonate (RENVELA) 800 MG tablet Take 800 mg by mouth 3 (three) times daily with meals.     No current facility-administered medications for this visit.    Allergies:   Iron and Tape   Social History:  The patient  reports that he has been smoking cigarettes. He started smoking about 34 years ago. He has a 17.6 pack-year smoking history. He has never used smokeless tobacco. He reports that he does not drink alcohol and does not use drugs.   Family History:  The patient's family history includes Colon polyps in his mother; Depression in his mother; Diabetes in his father; Diabetes Mellitus II in his mother; Heart disease in his father, maternal grandfather, and maternal grandmother; High Cholesterol in his father and mother; Hypertension in his father and mother.  ROS:  Please see the history of present illness.    All other systems are reviewed and otherwise negative.   PHYSICAL EXAM:  VS:  There were no vitals taken for this visit. BMI: There is no height or weight on file to calculate BMI. Well nourished, well developed, in no acute distress HEENT: normocephalic, atraumatic Neck: no JVD, carotid bruits or masses Cardiac:  RRR; no significant murmurs, no rubs, or gallops Lungs:  CTA b/l, no wheezing, rhonchi or rales Abd: soft, nontender MS: RLE prosthesis Ext: trace- 1+ edema LLE to  below mid shin, chronic/scaling looking skin changes, shoe was not removed Skin: warm and dry, no rash Neuro:  No gross deficits appreciated Psych: euthymic mood, full affect    EKG:  not done today    April 2024 monitor Predominant rhythm was sinus rhythm 752 SVT episodes, longest and fastest 19.2 seconds average 144 bpm. Less than 1% ventricular and supraventricular ectopy Triggered episodes associated with sinus tachycardia   May 2023: monitor Sinus rhythm. Minimum heart rate of 59 bpm. Maximum heart rate of 171 bpm.  1 run of Ventricular Tachycardia occurred lasting 5 beats  47 Supraventricular Tachycardia  runs occurred, the longest lasting 2 mins 8 seconds. Supraventricular Tachycardia was detected within +/- 45 seconds of symptomatic patient event(s). Rare Premature atrial contractions (<1%) Rare premature ventricular contractions. (<1.0%, 1357)  03/31/2022: TTE 1. Left ventricular ejection fraction, by estimation, is 60 to 65%. The  left ventricle has normal function. The left ventricle has no regional  wall motion abnormalities. There is mild left ventricular hypertrophy.  Left ventricular diastolic parameters  were normal.   2. Right ventricular systolic function is normal. The right ventricular  size is normal. Tricuspid regurgitation signal is inadequate for assessing  PA pressure.   3. The mitral valve is normal in structure. No evidence of mitral valve  regurgitation. No evidence of mitral stenosis.   4. The aortic valve has an indeterminant number of cusps. Aortic valve  regurgitation is not visualized. No aortic stenosis is present.   5. The inferior vena cava is normal in size with greater than 50%  respiratory variability, suggesting right atrial pressure of 3 mmHg.   Comparison(s): No prior Echocardiogram.    Recent Labs: 09/10/2022: B Natriuretic Peptide 33.0 02/12/2023: Magnesium 1.8 02/15/2023: ALT 10 05/18/2023: BUN 83; Creatinine, Ser 5.51; Platelets  159; Potassium 5.1; Sodium 140 07/15/2023: Hemoglobin 11.5  05/28/2023: Chol/HDL Ratio 2.5; Cholesterol, Total 92; HDL 37; LDL Chol Calc (NIH) 41; Triglycerides 58   CrCl cannot be calculated (Patient's most recent lab result is older than the maximum  21 days allowed.).   Wt Readings from Last 3 Encounters:  05/18/23 236 lb 9.6 oz (107.3 kg)  05/04/23 238 lb (108 kg)  04/13/23 232 lb (105.2 kg)     Other studies reviewed: Additional studies/records reviewed today include: summarized above  ASSESSMENT AND PLAN:  SVT He feels like there has been some benefit with the Toprol We will move further on it  100mg  AM 50mg  at night  HR and BP should tolerate  2. HTN A little high     Disposition: back in 3 mo again, sooner if needed    Current medicines are reviewed at length with the patient today.  The patient did not have any concerns regarding medicines.  Norma Fredrickson, PA-C 07/18/2023 10:36 AM     [facility] [address] [place] [address] [phone] (office)  [phone] (fax)

## 2023-07-19 ENCOUNTER — Encounter: Payer: Self-pay | Admitting: Physician Assistant

## 2023-07-19 ENCOUNTER — Ambulatory Visit: Payer: 59 | Attending: Physician Assistant | Admitting: Physician Assistant

## 2023-07-19 VITALS — BP 140/84 | HR 89 | Ht 77.0 in | Wt 254.6 lb

## 2023-07-19 DIAGNOSIS — I10 Essential (primary) hypertension: Secondary | ICD-10-CM

## 2023-07-19 DIAGNOSIS — I471 Supraventricular tachycardia, unspecified: Secondary | ICD-10-CM | POA: Diagnosis not present

## 2023-07-19 MED ORDER — METOPROLOL SUCCINATE ER 50 MG PO TB24: ORAL_TABLET | ORAL | 3 refills | Status: DC

## 2023-07-19 NOTE — Patient Instructions (Addendum)
Medication Instructions:   START TAKING:  METOPROLOL 100 MG IN THE AND 50 MG IN THE PM   *If you need a refill on your cardiac medications before your next appointment, please call your pharmacy*   Lab Work: NONE ORDERED  TODAY    If you have labs (blood work) drawn today and your tests are completely normal, you will receive your results only by: MyChart Message (if you have MyChart) OR A paper copy in the mail If you have any lab test that is abnormal or we need to change your treatment, we will call you to review the results.   Testing/Procedures: NONE ORDERED  TODAY    Follow-Up: At [facility], you and your health needs are our priority.  As part of our continuing mission to provide you with exceptional heart care, we have created designated Provider Care Teams.  These Care Teams include your primary Cardiologist (physician) and Advanced Practice Providers (APPs -  Physician Assistants and Nurse Practitioners) who all work together to provide you with the care you need, when you need it.  We recommend signing up for the patient portal called "MyChart".  Sign up information is provided on this After Visit Summary.  MyChart is used to connect with patients for Virtual Visits (Telemedicine).  Patients are able to view lab/test results, encounter notes, upcoming appointments, etc.  Non-urgent messages can be sent to your provider as well.   To learn more about what you can do with MyChart, go to [email or web address].    Your next appointment:   3 month(s)  Provider:   Loman Brooklyn, MD or Francis Dowse, PA-C     Other Instructions

## 2023-07-20 DIAGNOSIS — L603 Nail dystrophy: Secondary | ICD-10-CM | POA: Diagnosis not present

## 2023-07-20 DIAGNOSIS — Z794 Long term (current) use of insulin: Secondary | ICD-10-CM | POA: Diagnosis not present

## 2023-07-20 DIAGNOSIS — L84 Corns and callosities: Secondary | ICD-10-CM | POA: Diagnosis not present

## 2023-07-20 DIAGNOSIS — Z89412 Acquired absence of left great toe: Secondary | ICD-10-CM | POA: Diagnosis not present

## 2023-07-20 DIAGNOSIS — E1142 Type 2 diabetes mellitus with diabetic polyneuropathy: Secondary | ICD-10-CM | POA: Diagnosis not present

## 2023-07-20 DIAGNOSIS — Z89511 Acquired absence of right leg below knee: Secondary | ICD-10-CM | POA: Diagnosis not present

## 2023-07-23 ENCOUNTER — Ambulatory Visit: Payer: 59 | Admitting: Student

## 2023-07-23 ENCOUNTER — Encounter: Payer: Self-pay | Admitting: Student

## 2023-07-23 VITALS — BP 150/89 | HR 94 | Wt 233.5 lb

## 2023-07-23 DIAGNOSIS — T7411XA Adult physical abuse, confirmed, initial encounter: Secondary | ICD-10-CM | POA: Insufficient documentation

## 2023-07-23 DIAGNOSIS — M24542 Contracture, left hand: Secondary | ICD-10-CM | POA: Insufficient documentation

## 2023-07-23 DIAGNOSIS — N185 Chronic kidney disease, stage 5: Secondary | ICD-10-CM | POA: Diagnosis not present

## 2023-07-23 HISTORY — DX: Contracture, left hand: M24.542

## 2023-07-23 NOTE — Assessment & Plan Note (Signed)
Two fingers with contractures on exam. May need surgical release. Will follow this up in 2 weeks to discuss further.

## 2023-07-23 NOTE — Patient Instructions (Signed)
It was great to see you! Thank you for allowing me to participate in your care!   Our plans for today:  - I am going to file a report based on what we discussed - I put in an urgent referral to vascular surgery for your fistula creation - Please go to the ED if your situation gets worse. You may always call here - Follow up in 2 weeks for hand and to see what has been going on  Take care and seek immediate care sooner if you develop any concerns.  [name], MD
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
## 2023-07-23 NOTE — Assessment & Plan Note (Signed)
>>  ASSESSMENT AND PLAN FOR CHRONIC KIDNEY DISEASE, STAGE 5 (HCC) WRITTEN ON 07/23/2023 11:12 PM BY JAGADISH, MAYURI, MD  Creatinine stable on previous labs. Will place another referral for vascular to help get an appointment -Vascular referral
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
## 2023-07-23 NOTE — Assessment & Plan Note (Signed)
Creatinine stable on previous labs. Will place another referral for vascular to help get an appointment -Vascular referral

## 2023-07-27 ENCOUNTER — Other Ambulatory Visit: Payer: Self-pay | Admitting: Student

## 2023-07-27 DIAGNOSIS — N185 Chronic kidney disease, stage 5: Secondary | ICD-10-CM | POA: Diagnosis not present

## 2023-07-29 ENCOUNTER — Other Ambulatory Visit: Payer: 59 | Admitting: Licensed Clinical Social Worker

## 2023-07-29 ENCOUNTER — Encounter (HOSPITAL_COMMUNITY)
Admission: RE | Admit: 2023-07-29 | Discharge: 2023-07-29 | Disposition: A | Discharge status: Home / Self Care | Payer: 59 | Source: Ambulatory Visit | Attending: Nephrology | Admitting: Nephrology

## 2023-07-29 VITALS — BP 155/86 | HR 79 | Temp 97.7°F | Resp 18

## 2023-07-29 DIAGNOSIS — D649 Anemia, unspecified: Secondary | ICD-10-CM | POA: Insufficient documentation

## 2023-07-29 DIAGNOSIS — D62 Acute posthemorrhagic anemia: Secondary | ICD-10-CM | POA: Diagnosis not present

## 2023-07-29 DIAGNOSIS — N179 Acute kidney failure, unspecified: Secondary | ICD-10-CM | POA: Diagnosis not present

## 2023-07-29 LAB — IRON AND TIBC
Iron: 91 ug/dL (ref 45–182)
Saturation Ratios: 26 % (ref 17.9–39.5)
TIBC: 354 ug/dL (ref 250–450)
UIBC: 263 ug/dL

## 2023-07-29 LAB — POCT HEMOGLOBIN-HEMACUE: Hemoglobin: 11.4 g/dL — ABNORMAL LOW (ref 13.0–17.0)

## 2023-07-29 LAB — FERRITIN: Ferritin: 17 ng/mL — ABNORMAL LOW (ref 24–336)

## 2023-07-29 MED ORDER — EPOETIN ALFA-EPBX 10000 UNIT/ML IJ SOLN
20000.0000 [IU] | INTRAMUSCULAR | Status: DC
  Administered 2023-07-29: 20000 [IU] via SUBCUTANEOUS

## 2023-07-29 MED ORDER — EPOETIN ALFA-EPBX 10000 UNIT/ML IJ SOLN
INTRAMUSCULAR | Status: AC
  Filled 2023-07-29: qty 2

## 2023-07-29 NOTE — Patient Instructions (Signed)
Visit Information  Thank you for taking time to visit with me today. Please don't hesitate to contact me if I can be of assistance to you.   Following are the goals we discussed today:   Our next appointment is by telephone on 08/09/23 at 1 pm  If you are experiencing a Mental Health or Behavioral Health Crisis or need someone to talk to, please call the Suicide and Crisis Lifeline: 988 call the [place] National Suicide Prevention Lifeline: (228) 207-6774 or TTY: 650-713-0844 TTY 530-454-9425) to talk to a trained counselor call 1-800-273-TALK (toll free, 24 hour hotline) go to [facility] [address], [place] [phone]) call 911   Dickie La, BSW, MSW, [employer] Managed Medicaid LCSW [facility]  [facility] [email or web address] Phone: [phone]
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
## 2023-08-03 DIAGNOSIS — R339 Retention of urine, unspecified: Secondary | ICD-10-CM | POA: Diagnosis not present

## 2023-08-03 DIAGNOSIS — R296 Repeated falls: Secondary | ICD-10-CM | POA: Diagnosis not present

## 2023-08-03 DIAGNOSIS — N2581 Secondary hyperparathyroidism of renal origin: Secondary | ICD-10-CM | POA: Diagnosis not present

## 2023-08-03 DIAGNOSIS — R809 Proteinuria, unspecified: Secondary | ICD-10-CM | POA: Diagnosis not present

## 2023-08-03 DIAGNOSIS — D631 Anemia in chronic kidney disease: Secondary | ICD-10-CM | POA: Diagnosis not present

## 2023-08-03 DIAGNOSIS — N185 Chronic kidney disease, stage 5: Secondary | ICD-10-CM | POA: Diagnosis not present

## 2023-08-03 DIAGNOSIS — I120 Hypertensive chronic kidney disease with stage 5 chronic kidney disease or end stage renal disease: Secondary | ICD-10-CM | POA: Diagnosis not present

## 2023-08-03 DIAGNOSIS — I739 Peripheral vascular disease, unspecified: Secondary | ICD-10-CM | POA: Diagnosis not present
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
## 2023-08-06 ENCOUNTER — Encounter: Payer: Self-pay | Admitting: Student

## 2023-08-06 ENCOUNTER — Ambulatory Visit (INDEPENDENT_AMBULATORY_CARE_PROVIDER_SITE_OTHER): Payer: 59 | Admitting: Student

## 2023-08-06 VITALS — BP 150/70 | HR 90 | Ht 77.0 in | Wt 234.4 lb

## 2023-08-06 DIAGNOSIS — N185 Chronic kidney disease, stage 5: Secondary | ICD-10-CM

## 2023-08-06 DIAGNOSIS — T7411XD Adult physical abuse, confirmed, subsequent encounter: Secondary | ICD-10-CM | POA: Diagnosis not present

## 2023-08-06 DIAGNOSIS — M2450 Contracture, unspecified joint: Secondary | ICD-10-CM

## 2023-08-06 DIAGNOSIS — M24542 Contracture, left hand: Secondary | ICD-10-CM | POA: Diagnosis not present
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
## 2023-08-06 NOTE — Patient Instructions (Addendum)
It was great to see you! Thank you for allowing me to participate in your care!   Our plans for today:  -I have placed another referral for [place] vascular-you can call at [phone] to try and set up an appointment -I can place a referral for hand surgery to take a look at your hand - I'd like to follow up in 1-2 month to see how you are doing   Take care and seek immediate care sooner if you develop any concerns.  [name], MD

## 2023-08-06 NOTE — Assessment & Plan Note (Signed)
Appears to be possibly Dupuytren's contracture of the left hand.  He does have multiple risk factors including smoking and diabetes.  I feel that he would benefit from hand surgery consultation given pain that this is caused and the amount of time this has been ongoing since April. -Hand surgery referral

## 2023-08-09 ENCOUNTER — Other Ambulatory Visit: Payer: 59

## 2023-08-09 NOTE — Patient Instructions (Addendum)
Medicaid Managed Care   Unsuccessful Outreach Note  08/09/2023 Name: [name] [name] MRN: [id number] DOB: [birth year]  Referred by: [name], MD Reason for referral : High Risk Managed Medicaid (MM social work unsuccessful telephone outreach )   An unsuccessful telephone outreach was attempted today. The patient was referred to the case management team for assistance with care management and care coordination.   Follow Up Plan: A HIPAA compliant phone message was left for the patient providing contact information and requesting a return call.   Gus Puma, Kenard Gower, MHA [facility]  Managed Medicaid Social Worker [phone] Visit Information  The Patient                                              was given information about Medicaid Managed Care team care coordination services and consented to engagement with the [facility] Managed Care team.   Social Worker will follow up in 30-45 days.   Abelino Derrick, MHA [facility]  Managed [facility] Social Worker [phone]
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
## 2023-08-09 NOTE — Patient Outreach (Signed)
Medicaid Managed Care   Unsuccessful Outreach Note  08/09/2023 Name: [name] [name] MRN: [id number] DOB: Apr 09, 1971  Referred by: [name], MD Reason for referral : High Risk Managed Medicaid (MM social work unsuccessful telephone outreach )   An unsuccessful telephone outreach was attempted today. The patient was referred to the case management team for assistance with care management and care coordination.   Follow Up Plan: A HIPAA compliant phone message was left for the patient providing contact information and requesting a return call.   Abelino Derrick, MHA [facility]  Managed [facility] Social Worker [phone]

## 2023-08-11 ENCOUNTER — Other Ambulatory Visit: Payer: Self-pay | Admitting: Student

## 2023-08-11 DIAGNOSIS — R112 Nausea with vomiting, unspecified: Secondary | ICD-10-CM

## 2023-08-12 ENCOUNTER — Encounter (HOSPITAL_COMMUNITY)
Admission: RE | Admit: 2023-08-12 | Discharge: 2023-08-12 | Disposition: A | Discharge status: Home / Self Care | Payer: 59 | Source: Ambulatory Visit | Attending: Nephrology | Admitting: Nephrology

## 2023-08-12 VITALS — BP 149/87 | HR 78 | Temp 97.7°F | Resp 17

## 2023-08-12 DIAGNOSIS — D649 Anemia, unspecified: Secondary | ICD-10-CM | POA: Diagnosis not present

## 2023-08-12 DIAGNOSIS — N179 Acute kidney failure, unspecified: Secondary | ICD-10-CM | POA: Diagnosis not present

## 2023-08-12 DIAGNOSIS — D62 Acute posthemorrhagic anemia: Secondary | ICD-10-CM | POA: Diagnosis not present

## 2023-08-12 LAB — POCT HEMOGLOBIN-HEMACUE: Hemoglobin: 11 g/dL — ABNORMAL LOW (ref 13.0–17.0)

## 2023-08-12 MED ORDER — EPOETIN ALFA-EPBX 10000 UNIT/ML IJ SOLN
INTRAMUSCULAR | Status: AC
  Administered 2023-08-12: 20000 [IU] via SUBCUTANEOUS
  Filled 2023-08-12: qty 2

## 2023-08-12 MED ORDER — EPOETIN ALFA-EPBX 10000 UNIT/ML IJ SOLN: 20000.0000 [IU] | INTRAMUSCULAR | Status: DC

## 2023-08-13 ENCOUNTER — Other Ambulatory Visit: Payer: 59 | Admitting: Licensed Clinical Social Worker

## 2023-08-13 ENCOUNTER — Telehealth: Payer: Self-pay | Admitting: Student

## 2023-08-13 NOTE — Telephone Encounter (Signed)
Patient is calling and would like his hand specialist referral sent to another office because he has not heard from [facility]. I explained to patients many places have wait list or you may not hear from them in one week.   Patient became very rude telling me "just send it" I informed him that I could not send it that I would send a message to the lady who is doing referral. He made me aware that he can get into [facility] this coming Tuesday. I told him we could request the referral but I could not guarantee it would be ready for the appointment for Tuesday.   [facility] would [facility] [address].

## 2023-08-13 NOTE — Patient Instructions (Signed)
[name] ,   The [facility] Managed Care Team is available to provide assistance to you with your healthcare needs at no cost and as a benefit of your [facility] Health plan. I'm sorry I was unable to reach you today for our scheduled appointment. Our care guide will call you to reschedule our telephone appointment. Please call me at the number below. I am available to be of assistance to you regarding your healthcare needs. .   Thank you,   Dickie La, BSW, MSW, LCSW Managed Medicaid LCSW [facility]  [email or web address] Phone: [phone]

## 2023-08-13 NOTE — Patient Outreach (Signed)
Medicaid Managed Care   Unsuccessful Attempt Note   08/13/2023 Name: [name] [name] MRN: [id number] DOB: June 04, 1971  Referred by: [name], MD Reason for referral : No chief complaint on file.   An unsuccessful telephone outreach was attempted today. The patient was referred to the case management team for assistance with care management and care coordination.    Follow Up Plan: A HIPAA compliant phone message was left for the patient providing contact information and requesting a return call.   Dickie La, BSW, MSW, [employer] Managed Medicaid LCSW [facility]  [facility] [email or web address] Phone: [phone]

## 2023-08-16 NOTE — Telephone Encounter (Signed)
Referral sent as requested. Jone Baseman, CMA

## 2023-08-17 DIAGNOSIS — M79642 Pain in left hand: Secondary | ICD-10-CM | POA: Diagnosis not present

## 2023-08-17 DIAGNOSIS — G5622 Lesion of ulnar nerve, left upper limb: Secondary | ICD-10-CM | POA: Diagnosis not present

## 2023-08-18 DIAGNOSIS — Z794 Long term (current) use of insulin: Secondary | ICD-10-CM | POA: Diagnosis not present

## 2023-08-18 DIAGNOSIS — E1165 Type 2 diabetes mellitus with hyperglycemia: Secondary | ICD-10-CM | POA: Diagnosis not present

## 2023-08-19 ENCOUNTER — Other Ambulatory Visit: Payer: Self-pay | Admitting: Student

## 2023-08-20 DIAGNOSIS — G5622 Lesion of ulnar nerve, left upper limb: Secondary | ICD-10-CM | POA: Diagnosis not present

## 2023-08-26 ENCOUNTER — Other Ambulatory Visit: Payer: 59 | Admitting: Licensed Clinical Social Worker

## 2023-08-26 ENCOUNTER — Ambulatory Visit (HOSPITAL_COMMUNITY)
Admission: RE | Admit: 2023-08-26 | Discharge: 2023-08-26 | Disposition: A | Discharge status: Home / Self Care | Payer: 59 | Source: Ambulatory Visit | Attending: Nephrology | Admitting: Nephrology

## 2023-08-26 VITALS — BP 154/84 | HR 88 | Temp 97.8°F | Resp 18

## 2023-08-26 DIAGNOSIS — N179 Acute kidney failure, unspecified: Secondary | ICD-10-CM | POA: Insufficient documentation

## 2023-08-26 DIAGNOSIS — D62 Acute posthemorrhagic anemia: Secondary | ICD-10-CM | POA: Insufficient documentation

## 2023-08-26 DIAGNOSIS — E118 Type 2 diabetes mellitus with unspecified complications: Secondary | ICD-10-CM | POA: Diagnosis not present

## 2023-08-26 DIAGNOSIS — D649 Anemia, unspecified: Secondary | ICD-10-CM | POA: Diagnosis not present

## 2023-08-26 DIAGNOSIS — Z794 Long term (current) use of insulin: Secondary | ICD-10-CM | POA: Diagnosis not present

## 2023-08-26 LAB — IRON AND TIBC
Iron: 126 ug/dL (ref 45–182)
Saturation Ratios: 41 % — ABNORMAL HIGH (ref 17.9–39.5)
TIBC: 305 ug/dL (ref 250–450)
UIBC: 179 ug/dL

## 2023-08-26 LAB — FERRITIN: Ferritin: 24 ng/mL (ref 24–336)

## 2023-08-26 LAB — POCT HEMOGLOBIN-HEMACUE: Hemoglobin: 11 g/dL — ABNORMAL LOW (ref 13.0–17.0)

## 2023-08-26 MED ORDER — EPOETIN ALFA-EPBX 10000 UNIT/ML IJ SOLN
INTRAMUSCULAR | Status: AC
  Filled 2023-08-26: qty 1

## 2023-08-26 MED ORDER — EPOETIN ALFA-EPBX 10000 UNIT/ML IJ SOLN
10000.0000 [IU] | INTRAMUSCULAR | Status: DC
  Administered 2023-08-26: 10000 [IU] via SUBCUTANEOUS
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
## 2023-08-26 NOTE — Patient Instructions (Addendum)
Visit Information  [name] [name] was given information about Medicaid Managed Care team care coordination services and verbally consented to engagement with the [facility] Managed Care team.   The patient declines further follow up and engagement by the care management team and the case will be closed. Appropriate care team members and provider have been notified via electronic communication. The care management team is available to provide care management/care coordination support at any time in the future should needs arise. Further engagement requires referral order ([id number]).      24- Hour Availability:    [facility]  [address] [place], [place] [phone] Crisis [phone]   Family Service of the [facility] [phone]  [place] [facility]  [phone]    [place] [facility]  [phone] (after hours)   Therapeutic Alternative/Mobile Crisis   [phone]   [place] National Suicide Hotline  316-451-1252 Len Childs) [place] [address]   Call [phone] for mental health emergencies   [facility]  [phone]);  [place] and [employer]  [phone]); [place], [place], [place], [place], [place], [place], [place]    [place] [facility] Residents For 24/7 walk-up access to mental health services for [facility] children (4+), adolescents and adults, please visit the [facility] located at [address] in [place], [place].  *[facility] also provides comprehensive outpatient behavioral health services in a variety of locations around the [place].  Connect With [place] [address] [place], [place] [address] HelpLine: [phone] or 1-2282457371  Get Directions  Find Help 24/7 By Phone Call our 24-hour HelpLine at [phone] or [phone] for immediate assistance for mental health and substance abuse issues.  Walk-In  Help [place]: [facility] (Ages 4 and Up) [place]: Emergency Dept., [facility] Additional Resources National Hopeline Network: 1-800-SUICIDE The National Suicide Prevention Lifeline: 1-800-273-TALK     The following coping skill education was provided for stress relief and mental health management: "When your car dies or a deadline looms, how do you respond? Long-term, low-grade or acute stress takes a serious toll on your body and mind, so don't ignore feelings of constant tension. Stress is a natural part of life. However, too much stress can harm our health, especially if it continues every day. This is chronic stress and can put you at risk for heart problems like heart disease and depression. Understand what's happening inside your body and learn simple coping skills to combat the negative impacts of everyday stressors.  Types of Stress There are two types of stress: Emotional - types of emotional stress are relationship problems, pressure at work, financial worries, experiencing discrimination or having a major life change. Physical - Examples of physical stress include being sick having pain, not sleeping well, recovery from an injury or having an alcohol and drug use disorder. Fight or Flight Sudden or ongoing stress activates your nervous system and floods your bloodstream with adrenaline and cortisol, two hormones that raise blood pressure, increase heart rate and spike blood sugar. These changes pitch your body into a fight or flight response. That enabled our ancestors to outrun saber-toothed tigers, and it's helpful today for situations like dodging a car accident. But most modern chronic stressors, such as finances or a challenging relationship, keep your body in that heightened state, which hurts your health. Effects of Too Much Stress If constantly under stress, most of [place] will eventually start  to function less well.   Multiple studies link chronic stress to a higher risk of heart disease, stroke, depression, weight gain, memory loss and even premature death, so it's important to recognize the warning signals. Talk to your doctor about ways to manage stress if you're experiencing any of these symptoms: Prolonged periods of poor sleep. Regular, severe headaches. Unexplained weight loss or gain. Feelings of isolation, withdrawal or worthlessness. Constant anger and irritability. Loss of interest in activities. Constant worrying or obsessive thinking. Excessive alcohol or drug use. Inability to concentrate.  10 Ways to Cope with Chronic Stress It's key to recognize stressful situations as they occur because it allows you to focus on managing how you react. We all need to know when to close our eyes and take a deep breath when we feel tension rising. Use these tips to prevent or reduce chronic stress. 1. Rebalance Work and Home All work and no play? If you're spending too much time at the office, intentionally put more dates in your calendar to enjoy time for fun, either alone or with others. 2. Get Regular Exercise Moving your body on a regular basis balances the nervous system and increases blood circulation, helping to flush out stress hormones. Even a daily 20-minute walk makes a difference. Any kind of exercise can lower stress and improve your mood ? just pick activities that you enjoy and make it a regular habit. 3. Eat Well and Limit Alcohol and Stimulants Alcohol, nicotine and caffeine may temporarily relieve stress but have negative health impacts and can make stress worse in the long run. Well-nourished bodies cope better, so start with a good breakfast, add more organic fruits and vegetables for a well-balanced diet, avoid processed foods and sugar, try herbal tea and drink more water. 4. Connect with Supportive People Talking face to face with another person releases hormones that reduce stress.  Lean on those good listeners in your life. 5. Carve Out Hobby Time Do you enjoy gardening, reading, listening to music or some other creative pursuit? Engage in activities that bring you pleasure and joy; research shows that reduces stress by almost half and lowers your heart rate, too. 6. Practice Meditation, Stress Reduction or Yoga Relaxation techniques activate a state of restfulness that counterbalances your body's fight-or-flight hormones. Even if this also means a 10-minute break in a long day: listen to music, read, go for a walk in nature, do a hobby, take a bath or spend time with a friend. Also consider doing a mindfulness exercise or try a daily deep breathing or imagery practice. Deep Breathing Slow, calm and deep breathing can help you relax. Try these steps to focus on your breathing and repeat as needed. Find a comfortable position and close your eyes. Exhale and drop your shoulders. Breathe in through your nose; fill your lungs and then your belly. Think of relaxing your body, quieting your mind and becoming calm and peaceful. Breathe out slowly through your nose, relaxing your belly. Think of releasing tension, pain, worries or distress. Repeat steps three and four until you feel relaxed. Imagery This involves using your mind to excite the senses -- sound, vision, smell, taste and feeling. This may help ease your stress. Begin by getting comfortable and then do some slow breathing. Imagine a place you love being at. It could be somewhere from your childhood, somewhere you vacationed or just a place in your imagination. Feel how it is to be in the place you're imagining. Pay attention to the  sounds, air, colors, and who is there with you. This is a place where you feel cared for and loved. All is well. You are safe. Take in all the smells, sounds, tastes and feelings. As you do, feel your body being nourished and healed. Feel the calm that surrounds you. Breathe in all the  good. Breathe out any discomfort or tension. 7. Sleep Enough If you get less than seven to eight hours of sleep, your body won't tolerate stress as well as it could. If stress keeps you up at night, address the cause, and add extra meditation into your day to make up for the lost z's. Try to get seven to nine hours of sleep each night. Make a regular bedtime schedule. Keep your room dark and cool. Try to avoid computers, TV, cell phones and tablets before bed. 8. Bond with Connections You Enjoy Go out for a coffee with a friend, chat with a neighbor, call a family member, visit with a clergy member, or even hang out with your pet. Clinical studies show that spending even a short time with a companion animal can cut anxiety levels almost in half. 9. Take a Vacation Getting away from it all can reset your stress tolerance by increasing your mental and emotional outlook, which makes you a happier, more productive person upon return. Leave your cellphone and laptop at home! 10. See a Counselor, Coach or Therapist If negative thoughts overwhelm your ability to make positive changes, it's time to seek professional help. Make an appointment today--your health and life are worth it."  Dickie La, BSW, MSW, [employer] Managed Medicaid LCSW [facility]  [facility] [email or web address] Phone: [phone]

## 2023-08-30 DIAGNOSIS — E1122 Type 2 diabetes mellitus with diabetic chronic kidney disease: Secondary | ICD-10-CM | POA: Diagnosis not present

## 2023-08-30 DIAGNOSIS — N185 Chronic kidney disease, stage 5: Secondary | ICD-10-CM | POA: Diagnosis not present

## 2023-09-02 DIAGNOSIS — G629 Polyneuropathy, unspecified: Secondary | ICD-10-CM | POA: Diagnosis not present

## 2023-09-02 DIAGNOSIS — G5603 Carpal tunnel syndrome, bilateral upper limbs: Secondary | ICD-10-CM | POA: Diagnosis not present

## 2023-09-04 ENCOUNTER — Other Ambulatory Visit: Payer: Self-pay | Admitting: Student

## 2023-09-07 ENCOUNTER — Telehealth: Payer: Self-pay | Admitting: Cardiovascular Disease

## 2023-09-07 ENCOUNTER — Other Ambulatory Visit: Payer: Self-pay | Admitting: Orthopedic Surgery

## 2023-09-07 ENCOUNTER — Encounter: Payer: Self-pay | Admitting: Student

## 2023-09-07 ENCOUNTER — Ambulatory Visit: Payer: 59 | Admitting: Student

## 2023-09-07 VITALS — BP 155/75 | HR 99 | Ht 77.0 in | Wt 242.8 lb

## 2023-09-07 DIAGNOSIS — T7411XD Adult physical abuse, confirmed, subsequent encounter: Secondary | ICD-10-CM

## 2023-09-07 DIAGNOSIS — F321 Major depressive disorder, single episode, moderate: Secondary | ICD-10-CM | POA: Diagnosis not present

## 2023-09-07 DIAGNOSIS — G5603 Carpal tunnel syndrome, bilateral upper limbs: Secondary | ICD-10-CM | POA: Diagnosis not present

## 2023-09-07 DIAGNOSIS — G5622 Lesion of ulnar nerve, left upper limb: Secondary | ICD-10-CM | POA: Diagnosis not present

## 2023-09-07 MED ORDER — FLUOXETINE HCL 10 MG PO CAPS
10.0000 mg | ORAL_CAPSULE | Freq: Every day | ORAL | 0 refills | Status: DC
Start: 2023-09-07 — End: 2023-10-04

## 2023-09-07 NOTE — Telephone Encounter (Signed)
   [facility] Pre-operative Risk Assessment    Request for surgical clearance:  What type of surgery is being performed?  Decompression Ulnar Nerve at Left Cubital Tunnel w Possible Subcutaneous Transposition and Left Carpal Tunnel Release   When is this surgery scheduled?  09/30/23   What type of clearance is required (medical clearance vs. Pharmacy clearance to hold med vs. Both)?  Medical  Are there any medications that need to be held prior to surgery and how long? No    Practice name and name of physician performing surgery?  [facility]  Dr. Betha Loa   What is your office phone number? [phone]    7.   What is your office fax number? [phone]  8.   Anesthesia type (None, local, MAC, general)? Choice    [name] [name] 09/07/2023, 2:43 PM
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
## 2023-09-07 NOTE — Patient Instructions (Addendum)
It was great to see you! Thank you for allowing me to participate in your care!   Our plans for today:  - Think about whether you would like legal aid to help as well - I have placed another referral for [place] vascular-you can call at [phone] to try and set up an appointment  - 1 month follow up for medicine  Take care and seek immediate care sooner if you develop any concerns.  [name], MD

## 2023-09-07 NOTE — Telephone Encounter (Signed)
Renee, could you please comment on patient's fitness to undergo carpal tunnel and cubital tunnel release since you recently saw him on 07/19/23?  Please route your response to p cv div preop.  Thank you, Marcelino Duster

## 2023-09-08 ENCOUNTER — Other Ambulatory Visit: Payer: Self-pay

## 2023-09-08 NOTE — Patient Outreach (Addendum)
Medicaid Managed Care   Unsuccessful Outreach Note  09/08/2023 Name: [name] [name] MRN: [id number] DOB: [birth year]  Referred by: [name], MD Reason for referral : High Risk Managed Medicaid (MM social work unsuccessful telephone outreach )   An unsuccessful telephone outreach was attempted today. The patient was referred to the case management team for assistance with care management and care coordination.   Follow Up Plan: The patient has been provided with contact information for the care management team and has been advised to call with any health related questions or concerns.  A HIPAA compliant phone message was left for the patient providing contact information and requesting a return call.   Per LCSW last note patient has declined care management services.  Abelino Derrick, MHA [facility]  Managed [facility] Social Worker [phone]

## 2023-09-08 NOTE — Patient Instructions (Signed)
Medicaid Managed Care   Unsuccessful Outreach Note  09/08/2023 Name: [name] [name] MRN: [id number] DOB: 12/24/1970  Referred by: [name], MD Reason for referral : High Risk Managed Medicaid (MM social work unsuccessful telephone outreach )   An unsuccessful telephone outreach was attempted today. The patient was referred to the case management team for assistance with care management and care coordination.   Follow Up Plan: A HIPAA compliant phone message was left for the patient providing contact information and requesting a return call.   Abelino Derrick, MHA [facility]  Managed [facility] Social Worker [phone]

## 2023-09-09 ENCOUNTER — Encounter (HOSPITAL_COMMUNITY)
Admission: RE | Admit: 2023-09-09 | Discharge: 2023-09-09 | Disposition: A | Discharge status: Home / Self Care | Payer: 59 | Source: Ambulatory Visit | Attending: Nephrology | Admitting: Nephrology

## 2023-09-09 VITALS — BP 180/100 | HR 104 | Temp 97.9°F | Resp 17

## 2023-09-09 DIAGNOSIS — D62 Acute posthemorrhagic anemia: Secondary | ICD-10-CM | POA: Insufficient documentation

## 2023-09-09 DIAGNOSIS — D649 Anemia, unspecified: Secondary | ICD-10-CM | POA: Diagnosis not present

## 2023-09-09 DIAGNOSIS — N179 Acute kidney failure, unspecified: Secondary | ICD-10-CM | POA: Insufficient documentation

## 2023-09-09 LAB — POCT HEMOGLOBIN-HEMACUE: Hemoglobin: 10.3 g/dL — ABNORMAL LOW (ref 13.0–17.0)

## 2023-09-09 MED ORDER — EPOETIN ALFA-EPBX 10000 UNIT/ML IJ SOLN
10000.0000 [IU] | INTRAMUSCULAR | Status: DC
  Administered 2023-09-09: 10000 [IU] via SUBCUTANEOUS

## 2023-09-09 MED ORDER — EPOETIN ALFA-EPBX 10000 UNIT/ML IJ SOLN
INTRAMUSCULAR | Status: AC
  Filled 2023-09-09: qty 1

## 2023-09-13 NOTE — Telephone Encounter (Signed)
Pt has been scheduled to see Francis Dowse, [facility] 10/05/23. Pre op clearance needed. I will update all providers involved.

## 2023-09-13 NOTE — Telephone Encounter (Signed)
Left message for the pt that the EP schedulers are trying to reach him to set up sooner appt as he needs pre op clearance as well. Left message to call back and ask for Cassie or Angel.

## 2023-09-13 NOTE — Telephone Encounter (Signed)
    Primary Cardiologist:None  Chart reviewed as part of pre-operative protocol coverage. Because of Ercil Gurkin Gabler's past medical history and time since last visit, he/she will require a follow-up visit in order to better assess preoperative cardiovascular risk.  Pre-op covering staff: - Please schedule  In Office appointment and call patient to inform them. - Please contact requesting surgeon's office via preferred method (i.e, phone, fax) to inform them of need for appointment prior to surgery.  If applicable, this message will also be routed to pharmacy pool and/or primary cardiologist for input on holding anticoagulant/antiplatelet agent as requested below so that this information is available at time of patient's appointment.   Ronney Asters, NP  09/13/2023, 9:30 AM

## 2023-09-13 NOTE — Telephone Encounter (Signed)
I will send a message to the EP Schedulers to see if they can get the pt in  sooner as pt is also needing pre op clearance as well.   Procedure is set for 09/30/23.

## 2023-09-15 ENCOUNTER — Other Ambulatory Visit: Payer: Self-pay | Admitting: Student

## 2023-09-22 DIAGNOSIS — D631 Anemia in chronic kidney disease: Secondary | ICD-10-CM | POA: Diagnosis not present

## 2023-09-22 DIAGNOSIS — E1122 Type 2 diabetes mellitus with diabetic chronic kidney disease: Secondary | ICD-10-CM | POA: Diagnosis not present

## 2023-09-22 DIAGNOSIS — N185 Chronic kidney disease, stage 5: Secondary | ICD-10-CM | POA: Diagnosis not present

## 2023-09-22 DIAGNOSIS — I120 Hypertensive chronic kidney disease with stage 5 chronic kidney disease or end stage renal disease: Secondary | ICD-10-CM | POA: Diagnosis not present

## 2023-09-22 DIAGNOSIS — N2581 Secondary hyperparathyroidism of renal origin: Secondary | ICD-10-CM | POA: Diagnosis not present

## 2023-09-22 DIAGNOSIS — E785 Hyperlipidemia, unspecified: Secondary | ICD-10-CM | POA: Diagnosis not present

## 2023-09-23 ENCOUNTER — Encounter (HOSPITAL_COMMUNITY)
Admission: RE | Admit: 2023-09-23 | Discharge: 2023-09-23 | Disposition: A | Discharge status: Home / Self Care | Payer: 59 | Source: Ambulatory Visit | Attending: Nephrology | Admitting: Nephrology

## 2023-09-23 VITALS — BP 177/99 | HR 95 | Temp 97.8°F

## 2023-09-23 DIAGNOSIS — D649 Anemia, unspecified: Secondary | ICD-10-CM

## 2023-09-23 DIAGNOSIS — N179 Acute kidney failure, unspecified: Secondary | ICD-10-CM | POA: Diagnosis not present

## 2023-09-23 DIAGNOSIS — D62 Acute posthemorrhagic anemia: Secondary | ICD-10-CM | POA: Diagnosis not present

## 2023-09-23 LAB — LAB REPORT - SCANNED
A1c: 6.1
EGFR: 7

## 2023-09-23 LAB — POCT HEMOGLOBIN-HEMACUE: Hemoglobin: 9.6 g/dL — ABNORMAL LOW (ref 13.0–17.0)

## 2023-09-23 MED ORDER — EPOETIN ALFA-EPBX 10000 UNIT/ML IJ SOLN: 10000.0000 [IU] | INTRAMUSCULAR | Status: DC

## 2023-09-23 MED ORDER — EPOETIN ALFA-EPBX 10000 UNIT/ML IJ SOLN
INTRAMUSCULAR | Status: AC
  Administered 2023-09-23: 10000 [IU] via SUBCUTANEOUS
  Filled 2023-09-23: qty 1

## 2023-09-25 DIAGNOSIS — E118 Type 2 diabetes mellitus with unspecified complications: Secondary | ICD-10-CM | POA: Diagnosis not present

## 2023-09-25 DIAGNOSIS — Z794 Long term (current) use of insulin: Secondary | ICD-10-CM | POA: Diagnosis not present

## 2023-09-28 ENCOUNTER — Other Ambulatory Visit: Payer: Self-pay | Admitting: Student

## 2023-10-02 ENCOUNTER — Other Ambulatory Visit: Payer: Self-pay | Admitting: Student

## 2023-10-02 DIAGNOSIS — F321 Major depressive disorder, single episode, moderate: Secondary | ICD-10-CM

## 2023-10-03 NOTE — Progress Notes (Unsigned)
Cardiology Office Note Date:  10/03/2023  Patient ID:  [name] [name], [name] [name] [birth year], MRN [id number] PCP:  [name], MD  Cardiologist:  Dr. Clifton James Electrophysiologist: Dr. Elberta Fortis Nephrology: [facility], Dr. Malen Gauze Endocrinology: Benita Stabile, FNP     Chief Complaint:    *** 3 mo, pre-op  History of Present Illness: [name] [name] is a 52 y.o. 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  He was referred to Dr. Clifton James April 2023 for palpitations, planned for monitoring and an echo  Monitor noted an SVT and recommended increasing his metoprolol and EP referral for possible ablation Turns out his PMD had already increased his metoprolol and no further changes made.  He saw Dr. Elberta Fortis 05/19/22, aware of on/off fast rates, otherwise no particular symptoms reported.  He wanted to try and avoid procedures, started on diltiazem, stopped his metoprolol.  ER visit 06/25/22 for a cyst/swelling L chest wall, (reportedly drained some years prior). Creat worse, discussed with his nepgrologist, Dr. Malen Gauze who has been seeing him regularly and likely pending need for HD, though if lytes were ok, no need to admit. CT chest noted mass/lesion, (not new noted on prior CTs) and radiology recommended MRI to better characterize. With some erythema given a course of antibiotics and discharged with close f/u with renal especially and PMD to discuss f/u on mass/MRI recs.  I saw him 06/30/22 His palpitations are less often/shorter but not gone. Mostly noted in the evenings in bed. They can last a couple minutes, he is aware of the fast rate but not particularly symptomatic otherwise. No CP No near syncope or syncope. No SOB While here his Dexcom alerts to BS of 67 and declining but feeling OK. He was given a cola and graham crackers with an upwards trend after a couple minutes,  still feeling well He follows closely with endo, has a insulin pump, infrequently with low readings His diltiazem was changed to PM dosing given palpitations seemed most often to bother him in the evenings.  Diltiazem was ultimately increased to 300mg  with ongoing palpitations  Admitted 09/10/22 for acute hypoxic respiratory failure, found with Rhinovirus Given Abx, steroid taper Discharged 09/12/22  LABS 09/12/22 K+ 4.0 BUN/Creat 77/5.52 (baseline looks 3-4's > 5) WBC 10.2 H/H 8.4/27  Baseline Hgb looks about  9-10) Plts 192  I saw him 09/2022 He is doing better. Initially denies any ongoing palpitations, though towards the end of our visit, again mentions palpitations/fast rates when in bed at night, lasting several minutes with a sense of racing beats.   No other associated symptoms No CP, denies SOB No dizzy spells, near syncope or syncope. He reports some edema since his hospital stay, doesn't think the Flomax works well for him.  C/w + urine OP. He reports that he takes lasix BID (not daily), has been for several months at the direction of Dr. Malen Gauze He follows with Dr. Malen Gauze, see her in December, there have been discussions he says about dialysis, though not a final decision/recommendation yet. Adjusted timing of his dilt with some continued symptoms/SVts   01/14/2023 at [facility] for L foot osteomyelitis and underwent a L  partial ray amputation on 01/16/2023 by podiatry then discharged on 3-week regimen of ciprofloxacin and Zyvox.  Discharged 01/20/23  02/12/2023 he presented to the [facility] ED again due to a near syncopal event; however after evaluation it was noted this was likely  due to to severe dehydration as the patient was not drinking water and has known end-stage renal disease not on dialysis.  He was given IV fluids with improvement and discharged.     Admitted 02/15/23 w/ complaints of SOB, palpitations, reported increasing frequency since his BKA of his tachycardia,  he was found to be severely anemic with a hemoglobin of 6.8 and transfused with 1 unit of PRBCs and subsequently 2 more units during the night, getting him to  8.4  VQ scan negative for PE, was  hypertensive with blood pressures ranging from 160-190 systolic with history of uncontrolled BP  EKG felt to represent AFlutter, had spontaneous CV to SR Mild HS trop elevation felt to be demand. 02/18/23, maintaining SR, in d/w Dr. Elberta Fortis, planned for out patent monitoring and decisions about a/c or not pending GI findings and w/u. Cards s/o noted no cause for his anemia on EGD  Discharged 02/18/27 Colonoscopy determined to be low yield given challenging prep. Patient underwent EGD on 3/29 which showed normal stomach duodenum. Ruled out GI etiology for anemia. Most likely this is secondary to chronic disease. GI to follow-up outpatient.   Out pt Monitoring: Predominant rhythm was sinus rhythm 752 SVT episodes, longest and fastest 19.2 seconds average 144 bpm. Less than 1% ventricular and supraventricular ectopy Triggered episodes associated with sinus tachycardia   I saw him 04/02/23 He feels palpitations, no associated dizziness, but bother him No CP No SOB No near syncope or syncope. He saw Dr. Malen Gauze yesterday He is on Farxiga confirmed with her yesterday , had labs yesterday as well He never started coreg, was unaware of the recommendation,never picked any rx up for it. He also was unaware of the increase of his hydralazine to TID and has continued BID Home BPs anywhere from 130's-170's/70's or so, better by my check He had never started Coreg so started on Toprol with plans to titrate as able Reviewed with Dr. Elberta Fortis, since not clearly AFlutter and GIB, not OAC yet Requested labs from [facility]. Nena Polio in [facility] 03/26/23: BUN/Creat 58/5.89 K+ 4.5 Mag 2 Hgb 9.6  5/16 Hgb 9.6  I saw him 04/13/23 He has not noticed any improvement in his palpitations with the Toprol. Not any worse  either, they can last 3-5 minutes, no clear trigger or pattern No CP No near syncope or syncope. Mentions today walking in felt unusually SOB, fatigued today No bleeding or signs of bleeding BS by his dexcom is 155 EKG is SR 85bpm, BP recheck in 130/76 Toprol increased  I saw him 07/19/23 He has been taking the Toprol 50mg  BID, it has seemed to help but continues with daily episodes of heart racing, less frequent Lasting a minute to a few minutes, making him feel weak and a little dizzy shen having them No CP Denies SOB He has noted that they are triggered with coughing. Continues to smoke, not particularly ready to look to given it up yet No near syncope or syncope. Reported some improvement in symptoms with BB adjustment and up-titrated further  Pending Ulnar Nerve at Left Cubital Tunnel w Possible Subcutaneous Transposition and Left Carpal Tunnel Release on 10/14/23  RCRI is 2 > 6.6% risk [facility]: ***  *** palps?  Better, different *** any brady? *** risk   Past Medical History:  Diagnosis Date   Allergy    seasonal and environmental   Anemia    ARF (acute renal failure) (HCC) 09/2017   Chronic constipation 05/13/2020   CKD (  chronic kidney disease) stage 4, GFR 15-29 ml/min (HCC)    Depression    Diabetes mellitus without complication (HCC) 2019   GERD (gastroesophageal reflux disease)    Hyperlipidemia    Hypertension    Necrotizing fasciitis (HCC) 10/13/2017   Neuromuscular disorder (HCC)    neuropathy   PAD (peripheral artery disease) (HCC)    Secondary hyperparathyroidism (HCC)    Wound dehiscence 11/24/2017    Past Surgical History:  Procedure Laterality Date   AMPUTATION Right 10/13/2017   Procedure: RIGHT BELOW KNEE AMPUTATION;  Surgeon: Nadara Mustard, MD;  Location[facility] OR;  Service: Orthopedics;  Laterality: Right;   AMPUTATION Right 11/24/2017   Procedure: AMPUTATION BELOW KNEE REVISION;  Surgeon: Nadara Mustard, MD;  Location: [facility] OR;  Service:  Orthopedics;  Laterality: Right;   AMPUTATION TOE Left    BIOPSY  09/02/2021   Procedure: BIOPSY;  Surgeon: Imogene Burn, MD;  Location: [facility] ENDOSCOPY;  Service: Gastroenterology;;   BIOPSY  02/18/2023   Procedure: BIOPSY;  Surgeon: Benancio Deeds, MD;  Location: [facility] ENDOSCOPY;  Service: Gastroenterology;;   COLONOSCOPY  05/11/2019   ESOPHAGOGASTRODUODENOSCOPY (EGD) WITH PROPOFOL N/A 09/02/2021   Procedure: ESOPHAGOGASTRODUODENOSCOPY (EGD) WITH PROPOFOL;  Surgeon: Imogene Burn, MD;  Location: [facility] ENDOSCOPY;  Service: Gastroenterology;  Laterality: N/A;   ESOPHAGOGASTRODUODENOSCOPY (EGD) WITH PROPOFOL N/A 02/18/2023   Procedure: ESOPHAGOGASTRODUODENOSCOPY (EGD) WITH PROPOFOL;  Surgeon: Benancio Deeds, MD;  Location: [facility] ENDOSCOPY;  Service: Gastroenterology;  Laterality: N/A;   NO PAST SURGERIES     POLYPECTOMY     WISDOM TOOTH EXTRACTION      Current Outpatient Medications  Medication Sig Dispense Refill   acetaminophen (TYLENOL) 325 MG tablet Take 2 tablets (650 mg total) by mouth every 6 (six) hours.     AGAMATRIX ULTRA-THIN LANCETS MISC Check blood sugars before meals twice daily 100 each 11   atorvastatin (LIPITOR) 40 MG tablet Take 1 tablet (40 mg total) by mouth at bedtime. 90 tablet 0   baclofen (LIORESAL) 10 MG tablet TAKE 1/2 TABLET BY MOUTH TWICE DAILY 30 tablet 0   Blood Glucose Monitoring Suppl (ACCU-CHEK GUIDE ME) w/Device KIT See admin instructions.     calcitRIOL (ROCALTROL) 0.5 MCG capsule Take 0.5 mcg by mouth in the morning and at bedtime.     carvedilol (COREG) 3.125 MG tablet Take 3.125 mg by mouth 2 (two) times daily with a meal.     diltiazem (CARDIZEM CD) 300 MG 24 hr capsule TAKE 1 CAPSULE(300 MG) BY MOUTH AT BEDTIME 90 capsule 3   FARXIGA 10 MG TABS tablet TAKE 1 TABLET BY MOUTH EVERY DAY 30 tablet 0   FLUoxetine (PROZAC) 10 MG capsule Take 1 capsule (10 mg total) by mouth daily. 30 capsule 0   furosemide (LASIX) 40 MG tablet Take 40 mg by mouth daily.      gabapentin (NEURONTIN) 300 MG capsule TAKE 1 CAPSULE(300 MG) BY MOUTH DAILY 30 capsule 0   glucose blood (AGAMATRIX PRESTO TEST) test strip Check blood sugars twice daily before meals 100 each 12   glucose blood test strip 1 each by Other route as needed for other. Use as instructed     HUMALOG KWIKPEN 100 UNIT/ML KwikPen Inject 6 Units into the skin 2 (two) times daily before lunch and supper. Dose per sliding scale 15 mL 2   hydrALAZINE (APRESOLINE) 50 MG tablet TAKE 1 AND 1/2 TABLETS(75 MG) BY MOUTH THREE TIMES DAILY 415 tablet 3   insulin degludec (TRESIBA FLEXTOUCH)  100 UNIT/ML FlexTouch Pen Inject 20 Units into the skin daily. 3 mL 11   Insulin Disposable Pump (OMNIPOD DASH PODS, GEN 4,) MISC      metoprolol succinate (TOPROL XL) 50 MG 24 hr tablet TAKE 100 MG IN THE AM AND TAKE 50 MG IN THE PM 270 tablet 3   ondansetron (ZOFRAN-ODT) 8 MG disintegrating tablet DISSOLVE 1 TABLET(8 MG) ON THE TONGUE EVERY 8 HOURS AS NEEDED FOR NAUSEA OR VOMITING 20 tablet 0   sevelamer carbonate (RENVELA) 800 MG tablet Take 800 mg by mouth 3 (three) times daily with meals.     No current facility-administered medications for this visit.    Allergies:   Iron and Tape   Social History:  The patient  reports that he has been smoking cigarettes. He started smoking about 34 years ago. He has a 17.7 pack-year smoking history. He has never used smokeless tobacco. He reports that he does not drink alcohol and does not use drugs.   Family History:  The patient's family history includes Colon polyps in his mother; Depression in his mother; Diabetes in his father; Diabetes Mellitus II in his mother; Heart disease in his father, maternal grandfather, and maternal grandmother; High Cholesterol in his father and mother; Hypertension in his father and mother.  ROS:  Please see the history of present illness.    All other systems are reviewed and otherwise negative.   PHYSICAL EXAM:  VS:  There were no vitals taken  for this visit. BMI: There is no height or weight on file to calculate BMI. Well nourished, well developed, in no acute distress HEENT: normocephalic, atraumatic Neck: no JVD, carotid bruits or masses Cardiac: *** RRR; no significant murmurs, no rubs, or gallops Lungs: *** CTA b/l, no wheezing, rhonchi or rales Abd: soft, nontender MS: RLE prosthesis Ext:  ***  trace- 1+ edema LLE to below mid shin, chronic/scaling looking skin changes, shoe was not removed Skin: warm and dry, no rash Neuro:  No gross deficits appreciated Psych: euthymic mood, full affect    EKG:  done today and reviewed by myself ***   April 2024 monitor Predominant rhythm was sinus rhythm 752 SVT episodes, longest and fastest 19.2 seconds average 144 bpm. Less than 1% ventricular and supraventricular ectopy Triggered episodes associated with sinus tachycardia   May 2023: monitor Sinus rhythm. Minimum heart rate of 59 bpm. Maximum heart rate of 171 bpm.  1 run of Ventricular Tachycardia occurred lasting 5 beats  47 Supraventricular Tachycardia  runs occurred, the longest lasting 2 mins 8 seconds. Supraventricular Tachycardia was detected within +/- 45 seconds of symptomatic patient event(s). Rare Premature atrial contractions (<1%) Rare premature ventricular contractions. (<1.0%, 1357)  03/31/2022: TTE 1. Left ventricular ejection fraction, by estimation, is 60 to 65%. The  left ventricle has normal function. The left ventricle has no regional  wall motion abnormalities. There is mild left ventricular hypertrophy.  Left ventricular diastolic parameters  were normal.   2. Right ventricular systolic function is normal. The right ventricular  size is normal. Tricuspid regurgitation signal is inadequate for assessing  PA pressure.   3. The mitral valve is normal in structure. No evidence of mitral valve  regurgitation. No evidence of mitral stenosis.   4. The aortic valve has an indeterminant number of cusps.  Aortic valve  regurgitation is not visualized. No aortic stenosis is present.   5. The inferior vena cava is normal in size with greater than 50%  respiratory variability, suggesting right  atrial pressure of 3 mmHg.   Comparison(s): No prior Echocardiogram.    Recent Labs: 02/12/2023: Magnesium 1.8 02/15/2023: ALT 10 05/18/2023: BUN 83; Creatinine, Ser 5.51; Platelets 159; Potassium 5.1; Sodium 140 09/23/2023: Hemoglobin 9.6  05/28/2023: Chol/HDL Ratio 2.5; Cholesterol, Total 92; HDL 37; LDL Chol Calc (NIH) 41; Triglycerides 58   CrCl cannot be calculated (Patient's most recent lab result is older than the maximum 21 days allowed.).   Wt Readings from Last 3 Encounters:  09/07/23 242 lb 12.8 oz (110.1 kg)  08/06/23 234 lb 6 oz (106.3 kg)  07/23/23 233 lb 8 oz (105.9 kg)     Other studies reviewed: Additional studies/records reviewed today include: summarized above  ASSESSMENT AND PLAN:  SVT Low burden on monitors though reported ongoing synmptoms *** He feels like there has been some benefit with the Toprol ***  2. HTN ***  3.  Pre-op cardiac frisk assessment Low cardiac risk surgery Risk assess ment score of 6.6% *** ***   Disposition: *** back in 3 mo again, sooner if needed    Current medicines are reviewed at length with the patient today.  The patient did not have any concerns regarding medicines.  Norma Fredrickson, PA-C 10/03/2023 12:12 PM     [facility] [address] [place] [address] [phone] (office)  [phone] (fax)

## 2023-10-04 ENCOUNTER — Other Ambulatory Visit: Payer: Self-pay | Admitting: Student

## 2023-10-05 ENCOUNTER — Ambulatory Visit: Payer: 59 | Admitting: Physician Assistant

## 2023-10-06 ENCOUNTER — Ambulatory Visit
Admission: RE | Admit: 2023-10-06 | Discharge: 2023-10-06 | Disposition: A | Discharge status: Home / Self Care | Payer: 59 | Source: Ambulatory Visit | Attending: Family Medicine | Admitting: Family Medicine

## 2023-10-06 ENCOUNTER — Ambulatory Visit: Payer: 59

## 2023-10-06 ENCOUNTER — Telehealth: Payer: Self-pay

## 2023-10-06 VITALS — BP 168/90 | HR 80 | Ht 77.0 in | Wt 244.0 lb

## 2023-10-06 DIAGNOSIS — Z89511 Acquired absence of right leg below knee: Secondary | ICD-10-CM

## 2023-10-06 DIAGNOSIS — M25462 Effusion, left knee: Secondary | ICD-10-CM | POA: Diagnosis not present

## 2023-10-06 DIAGNOSIS — I10 Essential (primary) hypertension: Secondary | ICD-10-CM | POA: Diagnosis not present

## 2023-10-06 DIAGNOSIS — W19XXXA Unspecified fall, initial encounter: Secondary | ICD-10-CM

## 2023-10-06 DIAGNOSIS — Y92009 Unspecified place in unspecified non-institutional (private) residence as the place of occurrence of the external cause: Secondary | ICD-10-CM

## 2023-10-06 DIAGNOSIS — S8992XA Unspecified injury of left lower leg, initial encounter: Secondary | ICD-10-CM | POA: Diagnosis not present

## 2023-10-06 NOTE — Telephone Encounter (Signed)
Called patient per Dr. Elmer Bales request to advise that she has placed order for knee X-ray.   Provided patient with contact information and address for [facility].   Answered all questions.   Veronda Prude, RN

## 2023-10-06 NOTE — Patient Instructions (Signed)
It was wonderful to see you today.  Please bring ALL of your medications with you to every visit.   Today we talked about:  Fall with lip and knee injury for your lip injury you can apply vaseline to the inside. Avoid spicy and acidic foods. Use a cool ice pack or towel to reduce swelling. Please avoid NSAIDs like ibuprofen, alleve, goody powders because of your kidney disease   For your knee swelling I have put in a referral to sports medicine.   Please follow up in 1 month with your primary care doctor   Thank you for choosing [facility].   Please call [phone] with any questions about today's appointment.  Please be sure to schedule follow up at the front desk before you leave today.   Lockie Mola, MD  Family Medicine
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
## 2023-10-07 ENCOUNTER — Encounter (HOSPITAL_COMMUNITY)
Admission: RE | Admit: 2023-10-07 | Discharge: 2023-10-07 | Disposition: A | Discharge status: Home / Self Care | Payer: 59 | Source: Ambulatory Visit | Attending: Nephrology | Admitting: Nephrology

## 2023-10-07 VITALS — BP 177/103 | HR 97 | Temp 97.5°F

## 2023-10-07 DIAGNOSIS — D62 Acute posthemorrhagic anemia: Secondary | ICD-10-CM | POA: Insufficient documentation

## 2023-10-07 DIAGNOSIS — D649 Anemia, unspecified: Secondary | ICD-10-CM | POA: Insufficient documentation

## 2023-10-07 DIAGNOSIS — N179 Acute kidney failure, unspecified: Secondary | ICD-10-CM | POA: Insufficient documentation

## 2023-10-07 LAB — IRON AND TIBC
Iron: 79 ug/dL (ref 45–182)
Saturation Ratios: 30 % (ref 17.9–39.5)
TIBC: 260 ug/dL (ref 250–450)
UIBC: 181 ug/dL

## 2023-10-07 LAB — FERRITIN: Ferritin: 50 ng/mL (ref 24–336)

## 2023-10-07 LAB — POCT HEMOGLOBIN-HEMACUE: Hemoglobin: 9.3 g/dL — ABNORMAL LOW (ref 13.0–17.0)

## 2023-10-07 MED ORDER — EPOETIN ALFA-EPBX 10000 UNIT/ML IJ SOLN
20000.0000 [IU] | INTRAMUSCULAR | Status: DC
  Administered 2023-10-07: 20000 [IU] via SUBCUTANEOUS

## 2023-10-07 MED ORDER — EPOETIN ALFA-EPBX 10000 UNIT/ML IJ SOLN
INTRAMUSCULAR | Status: AC
  Filled 2023-10-07: qty 2

## 2023-10-11 ENCOUNTER — Telehealth: Payer: Self-pay

## 2023-10-11 DIAGNOSIS — N185 Chronic kidney disease, stage 5: Secondary | ICD-10-CM

## 2023-10-11 NOTE — Telephone Encounter (Signed)
Patient calls nurse line regarding referral for fistula.   He states that he needs new referral to be sent to [facility].   Referral was initiated on 08/06/23, however, it has been closed.   Will forward to PCP and referral coordinator.   Veronda Prude, RN

## 2023-10-12 ENCOUNTER — Ambulatory Visit (INDEPENDENT_AMBULATORY_CARE_PROVIDER_SITE_OTHER): Payer: 59 | Admitting: Family Medicine

## 2023-10-12 ENCOUNTER — Ambulatory Visit
Admission: RE | Admit: 2023-10-12 | Discharge: 2023-10-12 | Disposition: A | Discharge status: Home / Self Care | Payer: 59 | Source: Ambulatory Visit | Attending: Family Medicine | Admitting: Family Medicine

## 2023-10-12 ENCOUNTER — Other Ambulatory Visit: Payer: Self-pay | Admitting: Student

## 2023-10-12 ENCOUNTER — Encounter: Payer: Self-pay | Admitting: Student

## 2023-10-12 ENCOUNTER — Ambulatory Visit (INDEPENDENT_AMBULATORY_CARE_PROVIDER_SITE_OTHER): Payer: 59 | Admitting: Student

## 2023-10-12 ENCOUNTER — Ambulatory Visit: Payer: Self-pay

## 2023-10-12 ENCOUNTER — Encounter: Payer: Self-pay | Admitting: Family Medicine

## 2023-10-12 VITALS — BP 141/67 | HR 87 | Ht 77.0 in | Wt 257.1 lb

## 2023-10-12 VITALS — BP 132/82 | Ht 77.0 in | Wt 240.0 lb

## 2023-10-12 DIAGNOSIS — M25532 Pain in left wrist: Secondary | ICD-10-CM

## 2023-10-12 DIAGNOSIS — M7650 Patellar tendinitis, unspecified knee: Secondary | ICD-10-CM

## 2023-10-12 DIAGNOSIS — I10 Essential (primary) hypertension: Secondary | ICD-10-CM

## 2023-10-12 DIAGNOSIS — S63502A Unspecified sprain of left wrist, initial encounter: Secondary | ICD-10-CM | POA: Insufficient documentation

## 2023-10-12 DIAGNOSIS — M7652 Patellar tendinitis, left knee: Secondary | ICD-10-CM

## 2023-10-12 DIAGNOSIS — M25562 Pain in left knee: Secondary | ICD-10-CM

## 2023-10-12 HISTORY — DX: Patellar tendinitis, unspecified knee: M76.50

## 2023-10-12 MED ORDER — AMLODIPINE BESYLATE 5 MG PO TABS: 5.0000 mg | ORAL_TABLET | Freq: Every day | ORAL | 0 refills | Status: DC

## 2023-10-12 NOTE — Progress Notes (Signed)
[name] [name] - 52 y.o. male MRN [id number]  Date of birth: December 21, 1970  PCP: [name], MD  Subjective:  No chief complaint on file. Left wrist and knee pain  HPI: Past Medical, Surgical, Social, and Family History Reviewed & Updated per EMR.   Patient is a 52 y.o. male here for pain in the left knee and wrist pain after a fall.    He was initially evaluated for his left knee and still fluid was seen on ultrasound.  He had x-rays done but has not gotten the results yet.  He has had some pain in the knee which seems to be getting better.  There has been no marked swelling, warmth, redness, and he does not have fever, chills, decreased range of motion, numbness, or weakness.  He also reports left wrist swelling and pain which he did not note previously.  He is unsure if he fell on his wrist.  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  He has not done anything for this.  Pertinent PMHx: Acute renal failure, type 2 diabetes  Past Surgical History:  Procedure Laterality Date   AMPUTATION Right 10/13/2017   Procedure: RIGHT BELOW KNEE AMPUTATION;  Surgeon: Nadara Mustard, MD;  Location: [facility] OR;  Service: Orthopedics;  Laterality: Right;   AMPUTATION Right 11/24/2017   Procedure: AMPUTATION BELOW KNEE REVISION;  Surgeon: Nadara Mustard, MD;  Location: [facility] OR;  Service: Orthopedics;  Laterality: Right;   AMPUTATION TOE Left    BIOPSY  09/02/2021   Procedure: BIOPSY;  Surgeon: Imogene Burn, MD;  Location: [facility] ENDOSCOPY;  Service: Gastroenterology;;   BIOPSY  02/18/2023   Procedure: BIOPSY;  Surgeon: Benancio Deeds, MD;  Location: [facility] ENDOSCOPY;  Service: Gastroenterology;;   COLONOSCOPY  05/11/2019   ESOPHAGOGASTRODUODENOSCOPY (EGD) WITH PROPOFOL N/A 09/02/2021   Procedure: ESOPHAGOGASTRODUODENOSCOPY (EGD) WITH PROPOFOL;  Surgeon: Imogene Burn, MD;  Location: [facility] ENDOSCOPY;  Service: Gastroenterology;  Laterality: N/A;    ESOPHAGOGASTRODUODENOSCOPY (EGD) WITH PROPOFOL N/A 02/18/2023   Procedure: ESOPHAGOGASTRODUODENOSCOPY (EGD) WITH PROPOFOL;  Surgeon: Benancio Deeds, MD;  Location: [facility] ENDOSCOPY;  Service: Gastroenterology;  Laterality: N/A;   NO PAST SURGERIES     POLYPECTOMY     WISDOM TOOTH EXTRACTION      Allergies  Allergen Reactions   Iron Nausea And Vomiting    With the iron tablets   Tape Rash    Rash at site of tape--okay to use paper tape        Objective:  Physical Exam: VS: BP:132/82  HR: bpm  TEMP: ( )  RESP:   HT:6\' 5"  (195.6 cm)   WT:240 lb (108.9 kg)  BMI:28.45  Gen: Well developed, NAD, speaks clearly, comfortable in exam room Respiratory: Normal work of breathing on room air, no respiratory distress Skin: No rashes, abrasions, or ecchymosis MSK:  KNEE: 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 Inspection of the left knee shows chronic dermatologic hyperkeratosis but no acute erythema, ecchymosis, edema, and no warmth to palpation. Trace effusion present Range of motion is full in flexion and extension Strength 5/5 in flexion and extension without pain. Anterior/posterior drawer negative Patellar translation nontender Femoral condyles nontender Mild tenderness over the inferior pole of the patella and patellar ligament down to the distal insertion Mild lateral joint tenderness   HAND/WRIST:  Lt wrist with chronic dermatologic hyperkeratosis, no acute erythema, ecchymosis, edema.  TTP along anatomical snuff box.  Some dorsal soft  tissue swelling.     Ultrasound of the left knee Small effusion in the suprapatellar pouch Patellar and quadriceps tendons both with trace hypoechoic intratendinous changes and some mild hypoechoic fluid surrounding the tendons Some soft tissue edema of the Hoffa's fat pad  Summary: Patella tendinitis with a small effusion.  Ultrasound and interpretation by Dr. Webb Silversmith and Dr. Christella Hartigan   Limited US of the Left Wrist:   1st  compartment: visualized in SAX and LAX.  APL and EPB fibers intact with some hypoechoic fluid surrounding the tendon from the distal radius to the wrist joint 2nd compartment: ECRB and ECRL visualized and LAX and SAX.  There is some mild hypoechoic fluid surrounding the tendons at the level of the wrist joint.  All fibers are intact 3rd compartment: EPL with some trace hypoechoic fluid at the distal insertion seen in SAX and LAX 4th compartment: ED and EI visualized and SA X and LAX.  There is hypoechoic fluid changes within the tendon sheath from Lister's tubercle down to the distal insertion points. Scaphoid: Visualized in LAX and SAX there is some hypoechoic fluid overlying the ostium.  No defects of the ostium appreciated.  Summary: Findings consistent with tendinosis of the 1-4 compartments.  All fibers are intact.  Worsened inflammation around the extensor digitorum at the level of the wrist joint.  There is some mild edema over the dorsal scaphoid surface as well.  Ultrasound and interpretation by Dr. Webb Silversmith and Dr. Christella Hartigan  Assessment & Plan:   Patellar tendinitis - X-rays from outside facility were independently reviewed by myself.  4 view AP, PA, lateral, sunrise of the left knee show some moderate lateral compartment joint height loss with some lateral compartment calcocondrinosis.  No apparent fractures and no soft tissue displacement. - Limited ultrasound reveals some patella tendinitis in both the quadriceps tendon and patellar ligament.  There is also some soft tissue edema in the fat pad and a mild effusion. - After discussion of possible treatments and joint decision-making the patient would like to start with ice, compression, topical Voltaren gel, and Tylenol for pain.  He cannot take NSAIDs due to kidney disease. - Body helix knee sleeve fitted today. - We will follow-up in 6 weeks.  No improvement with conservative management we can consider steroid injection.  Wrist sprain,  left, initial encounter - Mr. Lance Sell did not initially notice his left wrist pain and swelling.  He does have some snuffbox tenderness and ultrasound findings show some edema over his scaphoid. - X-rays pending to rule out scaphoid fracture. - We do not have a thumb spica splint that fits in clinic.  Patient will purchase 1 from outside vendor and wear until x-rays are negative for fracture.  If so, he can wear it during activities for comfort and at night for 1-2 weeks until his pain is under control. - If negative for fracture he can start range of motion and strengthening exercises for the wrist.  Printed materials given. - We discussed the use of ice, Voltaren gel, and Tylenol for this injury as well.    Rica Mote MD [facility] Fellow  Addendum:  Patient seen and examined in the office with fellow.   History, exam, plan of care were precepted with me.  Agree with findings as documented in fellow note with additions/modifications as noted above.  Darene Lamer, DO, CAQSM

## 2023-10-12 NOTE — Assessment & Plan Note (Signed)
-   X-rays from outside facility were independently reviewed by myself.  4 view AP, PA, lateral, sunrise of the left knee show some moderate lateral compartment joint height loss with some lateral compartment calcocondrinosis.  No apparent fractures and no soft tissue displacement. - Limited ultrasound reveals some patella tendinitis in both the quadriceps tendon and patellar ligament.  There is also some soft tissue edema in the fat pad and a mild effusion. - After discussion of possible treatments and joint decision-making the patient would like to start with ice, compression, topical Voltaren gel, and Tylenol for pain.  He cannot take NSAIDs due to kidney disease. - Body helix knee sleeve fitted today. - We will follow-up in 6 weeks.  No improvement with conservative management we can consider steroid injection.

## 2023-10-12 NOTE — Assessment & Plan Note (Signed)
-   Mr. [name] [name] did not initially notice his left wrist pain and swelling.  He does have some snuffbox tenderness and ultrasound findings show some edema over his scaphoid. - X-rays pending to rule out scaphoid fracture. - We do not have a thumb spica splint that fits in clinic.  Patient will purchase 1 from outside vendor and wear until x-rays are negative for fracture.  If so, he can wear it during activities for comfort and at night for 1-2 weeks until his pain is under control. - If negative for fracture he can start range of motion and strengthening exercises for the wrist.  Printed materials given. - We discussed the use of ice, Voltaren gel, and Tylenol for this injury as well.

## 2023-10-12 NOTE — Progress Notes (Signed)
    SUBJECTIVE:   CHIEF COMPLAINT / HPI:   Hypertension: Patient is a 52 y.o. male who present today for follow up of hypertension.   Patient endorses no problems  Home medications include: Coreg 3.125 twice daily, diltiazem 300 daily, hydralazine 75 3 times daily (other side effects blood pressure-farxiga 10 mg, Lasix 40 mg, metoprolol 100 a.m., 50 p.m.---unsure if he was taking metoprolol) Patient endorses taking these medications as prescribed.  Most recent creatinine trend:  Lab Results  Component Value Date   CREATININE 5.51 (H) 05/18/2023   CREATININE 4.73 (H) 03/01/2023   CREATININE 4.45 (H) 02/16/2023   Patient does check blood pressure at home states SBP 170s  PERTINENT  PMH / PSH: CKD V  OBJECTIVE:   BP (!) 141/67   Pulse 87   Ht 6\' 5"  (1.956 m)   Wt 257 lb 2 oz (116.6 kg)   SpO2 99%   BMI 30.49 kg/m   General: NAD, awake, alert, responsive to questions Head: Normocephalic atraumatic CV: Regular rate and rhythm no murmurs rubs or gallops Respiratory: Clear to ausculation bilaterally, no wheezes rales or crackles, chest rises symmetrically,  no increased work of breathing Extremities: Moves upper and lower extremities freely, prosthesis on right  ASSESSMENT/PLAN:   Assessment & Plan Primary hypertension Not controlled today 141/67 on recheck. Likely close to needing dialysis which will help with BP control. -START amlodipine 5 mg daily -Coreg 3.125 twice daily -diltiazem 300 daily -hydralazine 75 3 times daily -farxiga 10 mg -Lasix 40 mg -2 week follow up for BP recheck and bring meds    [name], MD [facility]

## 2023-10-12 NOTE — Patient Instructions (Signed)
It was great to see you! Thank you for allowing me to participate in your care!   Our plans for today:  - I am going to restart amlodipine 5 mg daily - I want to see you back in 2 weeks for blood pressure recheck and please bring all of your medicines with you  Take care and seek immediate care sooner if you develop any concerns.  [name], MD

## 2023-10-13 ENCOUNTER — Encounter (HOSPITAL_COMMUNITY): Payer: Self-pay

## 2023-10-13 ENCOUNTER — Emergency Department (HOSPITAL_COMMUNITY): Payer: 59

## 2023-10-13 ENCOUNTER — Inpatient Hospital Stay (HOSPITAL_COMMUNITY)
Admission: EM | Admit: 2023-10-13 | Discharge: 2023-10-26 | DRG: 480 | Disposition: A | Discharge status: Skilled Nursing Facility | Payer: 59 | Attending: Family Medicine | Admitting: Family Medicine

## 2023-10-13 DIAGNOSIS — M7650 Patellar tendinitis, unspecified knee: Secondary | ICD-10-CM | POA: Diagnosis not present

## 2023-10-13 DIAGNOSIS — E1151 Type 2 diabetes mellitus with diabetic peripheral angiopathy without gangrene: Secondary | ICD-10-CM | POA: Diagnosis present

## 2023-10-13 DIAGNOSIS — T7411XD Adult physical abuse, confirmed, subsequent encounter: Secondary | ICD-10-CM | POA: Diagnosis not present

## 2023-10-13 DIAGNOSIS — R296 Repeated falls: Secondary | ICD-10-CM | POA: Diagnosis present

## 2023-10-13 DIAGNOSIS — I951 Orthostatic hypotension: Secondary | ICD-10-CM | POA: Diagnosis not present

## 2023-10-13 DIAGNOSIS — W19XXXA Unspecified fall, initial encounter: Secondary | ICD-10-CM

## 2023-10-13 DIAGNOSIS — Z833 Family history of diabetes mellitus: Secondary | ICD-10-CM

## 2023-10-13 DIAGNOSIS — M25561 Pain in right knee: Secondary | ICD-10-CM | POA: Diagnosis not present

## 2023-10-13 DIAGNOSIS — M79672 Pain in left foot: Secondary | ICD-10-CM | POA: Diagnosis not present

## 2023-10-13 DIAGNOSIS — R339 Retention of urine, unspecified: Secondary | ICD-10-CM | POA: Insufficient documentation

## 2023-10-13 DIAGNOSIS — N139 Obstructive and reflux uropathy, unspecified: Secondary | ICD-10-CM | POA: Diagnosis present

## 2023-10-13 DIAGNOSIS — Z79899 Other long term (current) drug therapy: Secondary | ICD-10-CM

## 2023-10-13 DIAGNOSIS — Z992 Dependence on renal dialysis: Secondary | ICD-10-CM | POA: Diagnosis not present

## 2023-10-13 DIAGNOSIS — S0990XD Unspecified injury of head, subsequent encounter: Secondary | ICD-10-CM

## 2023-10-13 DIAGNOSIS — E118 Type 2 diabetes mellitus with unspecified complications: Secondary | ICD-10-CM | POA: Diagnosis not present

## 2023-10-13 DIAGNOSIS — N189 Chronic kidney disease, unspecified: Secondary | ICD-10-CM | POA: Diagnosis not present

## 2023-10-13 DIAGNOSIS — S8991XA Unspecified injury of right lower leg, initial encounter: Secondary | ICD-10-CM | POA: Diagnosis not present

## 2023-10-13 DIAGNOSIS — E875 Hyperkalemia: Secondary | ICD-10-CM | POA: Diagnosis not present

## 2023-10-13 DIAGNOSIS — N185 Chronic kidney disease, stage 5: Secondary | ICD-10-CM | POA: Diagnosis present

## 2023-10-13 DIAGNOSIS — E1122 Type 2 diabetes mellitus with diabetic chronic kidney disease: Secondary | ICD-10-CM | POA: Diagnosis present

## 2023-10-13 DIAGNOSIS — M545 Low back pain, unspecified: Secondary | ICD-10-CM | POA: Diagnosis not present

## 2023-10-13 DIAGNOSIS — Z8249 Family history of ischemic heart disease and other diseases of the circulatory system: Secondary | ICD-10-CM

## 2023-10-13 DIAGNOSIS — Z4901 Encounter for fitting and adjustment of extracorporeal dialysis catheter: Secondary | ICD-10-CM | POA: Diagnosis not present

## 2023-10-13 DIAGNOSIS — T7411XA Adult physical abuse, confirmed, initial encounter: Secondary | ICD-10-CM | POA: Diagnosis present

## 2023-10-13 DIAGNOSIS — I132 Hypertensive heart and chronic kidney disease with heart failure and with stage 5 chronic kidney disease, or end stage renal disease: Secondary | ICD-10-CM | POA: Diagnosis not present

## 2023-10-13 DIAGNOSIS — Z89511 Acquired absence of right leg below knee: Secondary | ICD-10-CM

## 2023-10-13 DIAGNOSIS — N2581 Secondary hyperparathyroidism of renal origin: Secondary | ICD-10-CM | POA: Diagnosis present

## 2023-10-13 DIAGNOSIS — Z888 Allergy status to other drugs, medicaments and biological substances status: Secondary | ICD-10-CM

## 2023-10-13 DIAGNOSIS — Q809 Congenital ichthyosis, unspecified: Secondary | ICD-10-CM

## 2023-10-13 DIAGNOSIS — K8020 Calculus of gallbladder without cholecystitis without obstruction: Secondary | ICD-10-CM | POA: Diagnosis not present

## 2023-10-13 DIAGNOSIS — K219 Gastro-esophageal reflux disease without esophagitis: Secondary | ICD-10-CM | POA: Diagnosis present

## 2023-10-13 DIAGNOSIS — D649 Anemia, unspecified: Secondary | ICD-10-CM | POA: Diagnosis not present

## 2023-10-13 DIAGNOSIS — F329 Major depressive disorder, single episode, unspecified: Secondary | ICD-10-CM | POA: Diagnosis present

## 2023-10-13 DIAGNOSIS — R6889 Other general symptoms and signs: Secondary | ICD-10-CM | POA: Diagnosis not present

## 2023-10-13 DIAGNOSIS — I129 Hypertensive chronic kidney disease with stage 1 through stage 4 chronic kidney disease, or unspecified chronic kidney disease: Secondary | ICD-10-CM | POA: Diagnosis not present

## 2023-10-13 DIAGNOSIS — Z7982 Long term (current) use of aspirin: Secondary | ICD-10-CM | POA: Diagnosis not present

## 2023-10-13 DIAGNOSIS — N186 End stage renal disease: Secondary | ICD-10-CM | POA: Diagnosis present

## 2023-10-13 DIAGNOSIS — Z818 Family history of other mental and behavioral disorders: Secondary | ICD-10-CM

## 2023-10-13 DIAGNOSIS — E1165 Type 2 diabetes mellitus with hyperglycemia: Secondary | ICD-10-CM

## 2023-10-13 DIAGNOSIS — K8689 Other specified diseases of pancreas: Secondary | ICD-10-CM | POA: Diagnosis not present

## 2023-10-13 DIAGNOSIS — S72414D Nondisplaced unspecified condyle fracture of lower end of right femur, subsequent encounter for closed fracture with routine healing: Secondary | ICD-10-CM | POA: Diagnosis not present

## 2023-10-13 DIAGNOSIS — Z751 Person awaiting admission to adequate facility elsewhere: Secondary | ICD-10-CM

## 2023-10-13 DIAGNOSIS — Z95828 Presence of other vascular implants and grafts: Secondary | ICD-10-CM | POA: Diagnosis not present

## 2023-10-13 DIAGNOSIS — R10819 Abdominal tenderness, unspecified site: Secondary | ICD-10-CM

## 2023-10-13 DIAGNOSIS — Z560 Unemployment, unspecified: Secondary | ICD-10-CM

## 2023-10-13 DIAGNOSIS — Z9181 History of falling: Secondary | ICD-10-CM

## 2023-10-13 DIAGNOSIS — K5730 Diverticulosis of large intestine without perforation or abscess without bleeding: Secondary | ICD-10-CM | POA: Diagnosis not present

## 2023-10-13 DIAGNOSIS — M160 Bilateral primary osteoarthritis of hip: Secondary | ICD-10-CM | POA: Diagnosis not present

## 2023-10-13 DIAGNOSIS — M6702 Short Achilles tendon (acquired), left ankle: Secondary | ICD-10-CM | POA: Diagnosis not present

## 2023-10-13 DIAGNOSIS — I10 Essential (primary) hypertension: Secondary | ICD-10-CM | POA: Diagnosis not present

## 2023-10-13 DIAGNOSIS — E785 Hyperlipidemia, unspecified: Secondary | ICD-10-CM | POA: Diagnosis not present

## 2023-10-13 DIAGNOSIS — N250 Renal osteodystrophy: Secondary | ICD-10-CM | POA: Diagnosis not present

## 2023-10-13 DIAGNOSIS — D631 Anemia in chronic kidney disease: Secondary | ICD-10-CM | POA: Diagnosis not present

## 2023-10-13 DIAGNOSIS — Z794 Long term (current) use of insulin: Secondary | ICD-10-CM | POA: Diagnosis not present

## 2023-10-13 DIAGNOSIS — R262 Difficulty in walking, not elsewhere classified: Secondary | ICD-10-CM | POA: Diagnosis not present

## 2023-10-13 DIAGNOSIS — Z89412 Acquired absence of left great toe: Secondary | ICD-10-CM

## 2023-10-13 DIAGNOSIS — R270 Ataxia, unspecified: Secondary | ICD-10-CM | POA: Diagnosis not present

## 2023-10-13 DIAGNOSIS — Z9641 Presence of insulin pump (external) (internal): Secondary | ICD-10-CM | POA: Diagnosis present

## 2023-10-13 DIAGNOSIS — S72001D Fracture of unspecified part of neck of right femur, subsequent encounter for closed fracture with routine healing: Secondary | ICD-10-CM | POA: Diagnosis not present

## 2023-10-13 DIAGNOSIS — E1140 Type 2 diabetes mellitus with diabetic neuropathy, unspecified: Secondary | ICD-10-CM | POA: Diagnosis present

## 2023-10-13 DIAGNOSIS — E119 Type 2 diabetes mellitus without complications: Secondary | ICD-10-CM | POA: Insufficient documentation

## 2023-10-13 DIAGNOSIS — Y92524 Gas station as the place of occurrence of the external cause: Secondary | ICD-10-CM

## 2023-10-13 DIAGNOSIS — S72001A Fracture of unspecified part of neck of right femur, initial encounter for closed fracture: Principal | ICD-10-CM

## 2023-10-13 DIAGNOSIS — D62 Acute posthemorrhagic anemia: Secondary | ICD-10-CM | POA: Diagnosis present

## 2023-10-13 DIAGNOSIS — Z23 Encounter for immunization: Secondary | ICD-10-CM | POA: Diagnosis not present

## 2023-10-13 DIAGNOSIS — Z8739 Personal history of other diseases of the musculoskeletal system and connective tissue: Secondary | ICD-10-CM

## 2023-10-13 DIAGNOSIS — Z89519 Acquired absence of unspecified leg below knee: Secondary | ICD-10-CM | POA: Diagnosis not present

## 2023-10-13 DIAGNOSIS — I152 Hypertension secondary to endocrine disorders: Secondary | ICD-10-CM | POA: Diagnosis present

## 2023-10-13 DIAGNOSIS — Z9889 Other specified postprocedural states: Secondary | ICD-10-CM | POA: Diagnosis not present

## 2023-10-13 DIAGNOSIS — F17210 Nicotine dependence, cigarettes, uncomplicated: Secondary | ICD-10-CM | POA: Diagnosis not present

## 2023-10-13 DIAGNOSIS — I499 Cardiac arrhythmia, unspecified: Secondary | ICD-10-CM

## 2023-10-13 DIAGNOSIS — M25461 Effusion, right knee: Secondary | ICD-10-CM | POA: Diagnosis not present

## 2023-10-13 DIAGNOSIS — Z5986 Financial insecurity: Secondary | ICD-10-CM

## 2023-10-13 DIAGNOSIS — S72141A Displaced intertrochanteric fracture of right femur, initial encounter for closed fracture: Secondary | ICD-10-CM

## 2023-10-13 DIAGNOSIS — D509 Iron deficiency anemia, unspecified: Secondary | ICD-10-CM | POA: Diagnosis present

## 2023-10-13 DIAGNOSIS — Z789 Other specified health status: Secondary | ICD-10-CM | POA: Insufficient documentation

## 2023-10-13 DIAGNOSIS — N179 Acute kidney failure, unspecified: Secondary | ICD-10-CM | POA: Diagnosis not present

## 2023-10-13 DIAGNOSIS — I5032 Chronic diastolic (congestive) heart failure: Secondary | ICD-10-CM | POA: Diagnosis present

## 2023-10-13 DIAGNOSIS — L97521 Non-pressure chronic ulcer of other part of left foot limited to breakdown of skin: Secondary | ICD-10-CM | POA: Diagnosis not present

## 2023-10-13 DIAGNOSIS — Z91410 Personal history of adult physical and sexual abuse: Secondary | ICD-10-CM

## 2023-10-13 DIAGNOSIS — R070 Pain in throat: Secondary | ICD-10-CM | POA: Diagnosis present

## 2023-10-13 DIAGNOSIS — Z83438 Family history of other disorder of lipoprotein metabolism and other lipidemia: Secondary | ICD-10-CM

## 2023-10-13 DIAGNOSIS — I120 Hypertensive chronic kidney disease with stage 5 chronic kidney disease or end stage renal disease: Secondary | ICD-10-CM | POA: Diagnosis not present

## 2023-10-13 DIAGNOSIS — S63502D Unspecified sprain of left wrist, subsequent encounter: Secondary | ICD-10-CM | POA: Diagnosis not present

## 2023-10-13 DIAGNOSIS — S72141D Displaced intertrochanteric fracture of right femur, subsequent encounter for closed fracture with routine healing: Secondary | ICD-10-CM | POA: Diagnosis not present

## 2023-10-13 DIAGNOSIS — Z7984 Long term (current) use of oral hypoglycemic drugs: Secondary | ICD-10-CM

## 2023-10-13 DIAGNOSIS — Z83719 Family history of colon polyps, unspecified: Secondary | ICD-10-CM

## 2023-10-13 DIAGNOSIS — K2289 Other specified disease of esophagus: Secondary | ICD-10-CM | POA: Diagnosis not present

## 2023-10-13 DIAGNOSIS — R531 Weakness: Secondary | ICD-10-CM | POA: Diagnosis not present

## 2023-10-13 DIAGNOSIS — W1830XA Fall on same level, unspecified, initial encounter: Secondary | ICD-10-CM | POA: Diagnosis present

## 2023-10-13 DIAGNOSIS — K7689 Other specified diseases of liver: Secondary | ICD-10-CM | POA: Diagnosis not present

## 2023-10-13 DIAGNOSIS — Z743 Need for continuous supervision: Secondary | ICD-10-CM | POA: Diagnosis not present

## 2023-10-13 DIAGNOSIS — Z91048 Other nonmedicinal substance allergy status: Secondary | ICD-10-CM

## 2023-10-13 DIAGNOSIS — M24542 Contracture, left hand: Secondary | ICD-10-CM | POA: Diagnosis not present

## 2023-10-13 DIAGNOSIS — R112 Nausea with vomiting, unspecified: Secondary | ICD-10-CM | POA: Diagnosis not present

## 2023-10-13 DIAGNOSIS — R278 Other lack of coordination: Secondary | ICD-10-CM | POA: Diagnosis not present

## 2023-10-13 DIAGNOSIS — R609 Edema, unspecified: Secondary | ICD-10-CM | POA: Diagnosis not present

## 2023-10-13 DIAGNOSIS — I878 Other specified disorders of veins: Secondary | ICD-10-CM | POA: Diagnosis present

## 2023-10-13 DIAGNOSIS — M79604 Pain in right leg: Secondary | ICD-10-CM | POA: Diagnosis not present

## 2023-10-13 HISTORY — DX: Displaced intertrochanteric fracture of right femur, initial encounter for closed fracture: S72.141A

## 2023-10-13 LAB — URINALYSIS, ROUTINE W REFLEX MICROSCOPIC
Bilirubin Urine: NEGATIVE
Glucose, UA: >=500 mg/dL — AB
Hgb urine dipstick: NEGATIVE
Ketones, ur: NEGATIVE mg/dL
Leukocytes,Ua: NEGATIVE
Nitrite: NEGATIVE
Protein, ur: >=300 mg/dL — AB
Specific Gravity, Urine: 1.01 (ref 1.005–1.030)
pH: 5 (ref 5.0–8.0)

## 2023-10-13 LAB — BASIC METABOLIC PANEL
Anion gap: 9 (ref 5–15)
BUN: 78 mg/dL — ABNORMAL HIGH (ref 6–20)
CO2: 17 mmol/L — ABNORMAL LOW (ref 22–32)
Calcium: 7.8 mg/dL — ABNORMAL LOW (ref 8.9–10.3)
Chloride: 114 mmol/L — ABNORMAL HIGH (ref 98–111)
Creatinine, Ser: 9.53 mg/dL — ABNORMAL HIGH (ref 0.61–1.24)
GFR, Estimated: 6 mL/min — ABNORMAL LOW (ref >60)
Glucose, Bld: 216 mg/dL — ABNORMAL HIGH (ref 70–99)
Potassium: 4.9 mmol/L (ref 3.5–5.1)
Sodium: 140 mmol/L (ref 135–145)

## 2023-10-13 LAB — CBC WITH DIFFERENTIAL/PLATELET
Abs Immature Granulocytes: 0.29 10*3/uL — ABNORMAL HIGH (ref 0.00–0.07)
Basophils Absolute: 0 10*3/uL (ref 0.0–0.1)
Basophils Relative: 0 %
Eosinophils Absolute: 0.2 10*3/uL (ref 0.0–0.5)
Eosinophils Relative: 1 %
HCT: 23.4 % — ABNORMAL LOW (ref 39.0–52.0)
Hemoglobin: 7 g/dL — ABNORMAL LOW (ref 13.0–17.0)
Immature Granulocytes: 2 %
Lymphocytes Relative: 9 %
Lymphs Abs: 1.3 10*3/uL (ref 0.7–4.0)
MCH: 28 pg (ref 26.0–34.0)
MCHC: 29.9 g/dL — ABNORMAL LOW (ref 30.0–36.0)
MCV: 93.6 fL (ref 80.0–100.0)
Monocytes Absolute: 0.8 10*3/uL (ref 0.1–1.0)
Monocytes Relative: 6 %
Neutro Abs: 11.7 10*3/uL — ABNORMAL HIGH (ref 1.7–7.7)
Neutrophils Relative %: 82 %
Platelets: 188 10*3/uL (ref 150–400)
RBC: 2.5 MIL/uL — ABNORMAL LOW (ref 4.22–5.81)
RDW: 16.5 % — ABNORMAL HIGH (ref 11.5–15.5)
WBC: 14.3 10*3/uL — ABNORMAL HIGH (ref 4.0–10.5)
nRBC: 0 % (ref 0.0–0.2)

## 2023-10-13 LAB — LIPASE, BLOOD: Lipase: 50 U/L (ref 11–51)

## 2023-10-13 LAB — PROTIME-INR
INR: 1 (ref 0.8–1.2)
Prothrombin Time: 13.4 s (ref 11.4–15.2)

## 2023-10-13 LAB — POC OCCULT BLOOD, ED: Fecal Occult Bld: NEGATIVE

## 2023-10-13 LAB — HEMOGLOBIN A1C
Hgb A1c MFr Bld: 6.1 % — ABNORMAL HIGH (ref 4.8–5.6)
Mean Plasma Glucose: 128.37 mg/dL

## 2023-10-13 LAB — BRAIN NATRIURETIC PEPTIDE: B Natriuretic Peptide: 279.3 pg/mL — ABNORMAL HIGH (ref 0.0–100.0)

## 2023-10-13 MED ORDER — SODIUM CHLORIDE 0.9 % IV BOLUS
1000.0000 mL | Freq: Once | INTRAVENOUS | Status: AC
  Administered 2023-10-13: 1000 mL via INTRAVENOUS

## 2023-10-13 MED ORDER — ACETAMINOPHEN 325 MG PO TABS
650.0000 mg | ORAL_TABLET | Freq: Four times a day (QID) | ORAL | Status: DC
  Administered 2023-10-13 – 2023-10-25 (×43): 650 mg via ORAL
  Filled 2023-10-13 (×43): qty 2

## 2023-10-13 MED ORDER — ATORVASTATIN CALCIUM 40 MG PO TABS
40.0000 mg | ORAL_TABLET | Freq: Every day | ORAL | Status: DC
  Administered 2023-10-14 – 2023-10-25 (×12): 40 mg via ORAL
  Filled 2023-10-13 (×12): qty 1

## 2023-10-13 MED ORDER — OXYCODONE-ACETAMINOPHEN 5-325 MG PO TABS
1.0000 | ORAL_TABLET | Freq: Once | ORAL | Status: AC
  Administered 2023-10-13: 1 via ORAL
  Filled 2023-10-13: qty 1

## 2023-10-13 MED ORDER — HYDROMORPHONE HCL 1 MG/ML IJ SOLN
1.0000 mg | INTRAMUSCULAR | Status: DC | PRN
  Administered 2023-10-13 – 2023-10-14 (×3): 1 mg via INTRAVENOUS
  Filled 2023-10-13 (×3): qty 1

## 2023-10-13 MED ORDER — DILTIAZEM HCL ER COATED BEADS 180 MG PO CP24
300.0000 mg | ORAL_CAPSULE | Freq: Every day | ORAL | Status: DC
  Administered 2023-10-14 – 2023-10-26 (×13): 300 mg via ORAL
  Filled 2023-10-13 (×13): qty 1

## 2023-10-13 MED ORDER — INSULIN ASPART 100 UNIT/ML IJ SOLN
0.0000 [IU] | INTRAMUSCULAR | Status: DC
  Administered 2023-10-14 – 2023-10-15 (×3): 1 [IU] via SUBCUTANEOUS

## 2023-10-13 MED ORDER — HYDROMORPHONE HCL 1 MG/ML IJ SOLN
0.5000 mg | Freq: Once | INTRAMUSCULAR | Status: AC
  Administered 2023-10-13: 0.5 mg via INTRAVENOUS
  Filled 2023-10-13: qty 1

## 2023-10-13 MED ORDER — SEVELAMER CARBONATE 800 MG PO TABS
800.0000 mg | ORAL_TABLET | Freq: Three times a day (TID) | ORAL | Status: DC
  Administered 2023-10-14 – 2023-10-17 (×7): 800 mg via ORAL
  Filled 2023-10-13 (×8): qty 1

## 2023-10-13 MED ORDER — AMLODIPINE BESYLATE 5 MG PO TABS
5.0000 mg | ORAL_TABLET | Freq: Every day | ORAL | Status: DC
  Administered 2023-10-14 – 2023-10-17 (×4): 5 mg via ORAL
  Filled 2023-10-13 (×4): qty 1

## 2023-10-13 MED ORDER — FENTANYL CITRATE PF 50 MCG/ML IJ SOSY
50.0000 ug | PREFILLED_SYRINGE | Freq: Once | INTRAMUSCULAR | Status: AC
  Administered 2023-10-13: 50 ug via INTRAMUSCULAR
  Filled 2023-10-13: qty 1

## 2023-10-13 MED ORDER — GABAPENTIN 300 MG PO CAPS
300.0000 mg | ORAL_CAPSULE | Freq: Every day | ORAL | Status: DC
  Administered 2023-10-14 – 2023-10-26 (×13): 300 mg via ORAL
  Filled 2023-10-13 (×13): qty 1

## 2023-10-13 MED ORDER — HYDROMORPHONE HCL 1 MG/ML IJ SOLN: 0.5000 mg | INTRAMUSCULAR | Status: DC | PRN

## 2023-10-13 MED ORDER — CARVEDILOL 12.5 MG PO TABS
12.5000 mg | ORAL_TABLET | Freq: Two times a day (BID) | ORAL | Status: DC
  Administered 2023-10-13 – 2023-10-26 (×23): 12.5 mg via ORAL
  Filled 2023-10-13 (×23): qty 1

## 2023-10-13 MED ORDER — HYDRALAZINE HCL 50 MG PO TABS
100.0000 mg | ORAL_TABLET | Freq: Two times a day (BID) | ORAL | Status: DC
  Administered 2023-10-13 – 2023-10-22 (×17): 100 mg via ORAL
  Filled 2023-10-13 (×5): qty 2
  Filled 2023-10-13: qty 4
  Filled 2023-10-13 (×6): qty 2
  Filled 2023-10-13 (×2): qty 4
  Filled 2023-10-13 (×3): qty 2

## 2023-10-13 MED ORDER — FLUOXETINE HCL 10 MG PO CAPS
10.0000 mg | ORAL_CAPSULE | Freq: Two times a day (BID) | ORAL | Status: DC
  Administered 2023-10-14: 10 mg via ORAL
  Filled 2023-10-13: qty 1

## 2023-10-13 MED ORDER — CALCITRIOL 0.5 MCG PO CAPS
0.5000 ug | ORAL_CAPSULE | Freq: Every day | ORAL | Status: DC
  Administered 2023-10-14 – 2023-10-21 (×8): 0.5 ug via ORAL
  Filled 2023-10-13 (×10): qty 1

## 2023-10-13 NOTE — ED Notes (Signed)
ED TO INPATIENT HANDOFF REPORT  ED Nurse Name and Phone #: Francene Castle [phone]  S Name/Age/Gender [name] [name] 52 y.o. male Room/Bed: 027C/027C  Code Status   Code Status: Full Code  Home/SNF/Other Home Patient oriented to: self, place, time, and situation Is this baseline? Yes   Triage Complete: Triage complete  Chief Complaint Closed intertrochanteric fracture of right hip [name]) [S72.141A]  Triage Note Brought in by EMS from a gas station after a fall. Pt states he lost his balance and landed on concrete ground with buttocks first. Complains of sacral pain and right leg pain. Right leg prosthesis in place. No LOC. Pt did not hit his head. Alert and oriented x 4.    Allergies Allergies  Allergen Reactions   Iron Nausea And Vomiting    With the iron tablets   Tape Rash    Rash at site of tape--okay to use paper tape    Level of Care/Admitting Diagnosis ED Disposition     ED Disposition  Admit   Condition  --   Comment  Hospital Area: [facility] [[id number]]  Level of Care: Med-Surg [[id number]]  May admit patient to [facility] or [facility] if equivalent level of care is available:: No  Covid Evaluation: Asymptomatic - no recent exposure (last 10 days) testing not required  Diagnosis: Closed intertrochanteric fracture of right hip [facility]) [[id number]]  Admitting Physician: Meryl Dare [[id number]]  Attending Physician: Latrelle Dodrill [phone]  Certification:: I certify this patient will need inpatient services for at least 2 midnights  Expected Medical Readiness: 10/17/2023          B Medical/Surgery History 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)    Depression    Diabetes mellitus without complication (HCC) 2019   GERD (gastroesophageal reflux  disease)    Hyperlipidemia    Hypertension    Necrotizing fasciitis (HCC) 10/13/2017   Neuromuscular disorder (HCC)    neuropathy   PAD (peripheral artery disease) (HCC)    Secondary hyperparathyroidism (HCC)    Wound dehiscence 11/24/2017   Past Surgical History:  Procedure Laterality Date   AMPUTATION Right 10/13/2017   Procedure: RIGHT BELOW KNEE AMPUTATION;  Surgeon: Nadara Mustard, MD;  Location: [facility] OR;  Service: Orthopedics;  Laterality: Right;   AMPUTATION Right 11/24/2017   Procedure: AMPUTATION BELOW KNEE REVISION;  Surgeon: Nadara Mustard, MD;  Location: [facility] OR;  Service: Orthopedics;  Laterality: Right;   AMPUTATION TOE Left    BIOPSY  09/02/2021   Procedure: BIOPSY;  Surgeon: Imogene Burn, MD;  Location: [facility] ENDOSCOPY;  Service: Gastroenterology;;   BIOPSY  02/18/2023   Procedure: BIOPSY;  Surgeon: Benancio Deeds, MD;  Location: [facility] ENDOSCOPY;  Service: Gastroenterology;;   COLONOSCOPY  05/11/2019   ESOPHAGOGASTRODUODENOSCOPY (EGD) WITH PROPOFOL N/A 09/02/2021   Procedure: ESOPHAGOGASTRODUODENOSCOPY (EGD) WITH PROPOFOL;  Surgeon: Imogene Burn, MD;  Location: [facility] ENDOSCOPY;  Service: Gastroenterology;  Laterality: N/A;   ESOPHAGOGASTRODUODENOSCOPY (EGD) WITH PROPOFOL N/A 02/18/2023   Procedure: ESOPHAGOGASTRODUODENOSCOPY (EGD) WITH PROPOFOL;  Surgeon: Benancio Deeds, MD;  Location: [facility] ENDOSCOPY;  Service: Gastroenterology;  Laterality: N/A;   NO PAST SURGERIES     POLYPECTOMY     WISDOM TOOTH EXTRACTION       A IV Location/Drains/Wounds Patient  Lines/Drains/Airways Status     Active Line/Drains/Airways     Name Placement date Placement time Site Days   Peripheral IV 10/13/23 20 G Right Antecubital 10/13/23  1948  Antecubital  less than 1   External Urinary Catheter 10/13/23  1608  --  less than 1            Intake/Output Last 24 hours  Intake/Output Summary (Last 24 hours) at 10/13/2023 2232 Last data filed at 10/13/2023 2218 Gross per 24 hour   Intake 1000 ml  Output --  Net 1000 ml    Labs/Imaging Results for orders placed or performed during the hospital encounter of 10/13/23 (from the past 48 hour(s))  Basic metabolic panel     Status: Abnormal   Collection Time: 10/13/23  7:48 PM  Result Value Ref Range   Sodium 140 135 - 145 mmol/L   Potassium 4.9 3.5 - 5.1 mmol/L   Chloride 114 (H) 98 - 111 mmol/L   CO2 17 (L) 22 - 32 mmol/L   Glucose, Bld 216 (H) 70 - 99 mg/dL    Comment: Glucose reference range applies only to samples taken after fasting for at least 8 hours.   BUN 78 (H) 6 - 20 mg/dL   Creatinine, Ser 0.98 (H) 0.61 - 1.24 mg/dL   Calcium 7.8 (L) 8.9 - 10.3 mg/dL   GFR, Estimated 6 (L) >60 mL/min    Comment: (NOTE) Calculated using the CKD-EPI Creatinine Equation (2021)    Anion gap 9 5 - 15    Comment: Performed at [facility], [address], [place], [place] [address]  CBC with Differential     Status: Abnormal   Collection Time: 10/13/23  7:48 PM  Result Value Ref Range   WBC 14.3 (H) 4.0 - 10.5 K/uL   RBC 2.50 (L) 4.22 - 5.81 MIL/uL   Hemoglobin 7.0 (L) 13.0 - 17.0 g/dL   HCT 78.2 (L) 95.6 - 21.3 %   MCV 93.6 80.0 - 100.0 fL   MCH 28.0 26.0 - 34.0 pg   MCHC 29.9 (L) 30.0 - 36.0 g/dL   RDW 08.6 (H) 57.8 - 46.9 %   Platelets 188 150 - 400 K/uL   nRBC 0.0 0.0 - 0.2 %   Neutrophils Relative % 82 %   Neutro Abs 11.7 (H) 1.7 - 7.7 K/uL   Lymphocytes Relative 9 %   Lymphs Abs 1.3 0.7 - 4.0 K/uL   Monocytes Relative 6 %   Monocytes Absolute 0.8 0.1 - 1.0 K/uL   Eosinophils Relative 1 %   Eosinophils Absolute 0.2 0.0 - 0.5 K/uL   Basophils Relative 0 %   Basophils Absolute 0.0 0.0 - 0.1 K/uL   Immature Granulocytes 2 %   Abs Immature Granulocytes 0.29 (H) 0.00 - 0.07 K/uL    Comment: Performed at [facility], [address] [place], [place] [address]  Brain natriuretic peptide     Status: Abnormal   Collection Time: 10/13/23  7:48 PM  Result Value Ref Range   B Natriuretic Peptide  279.3 (H) 0.0 - 100.0 pg/mL    Comment: Performed at [facility], [address] [place], [place] [address]  Protime-INR     Status: None   Collection Time: 10/13/23  8:08 PM  Result Value Ref Range   Prothrombin Time 13.4 11.4 - 15.2 seconds   INR 1.0 0.8 - 1.2    Comment: (NOTE) INR goal varies based on device and disease  states. Performed at [facility], [address], [place], [place] [address]   Lipase, blood     Status: None   Collection Time: 10/13/23  8:08 PM  Result Value Ref Range   Lipase 50 11 - 51 U/L    Comment: Performed at [facility], [address], [place], [place] [address]   CT L-SPINE NO CHARGE  Result Date: 10/13/2023 CLINICAL DATA:  Pain after fall. EXAM: CT LUMBAR SPINE WITHOUT CONTRAST TECHNIQUE: Multidetector CT imaging of the lumbar spine was performed without intravenous contrast administration. Multiplanar CT image reconstructions were also generated. RADIATION DOSE REDUCTION: This exam was performed according to the departmental dose-optimization program which includes automated exposure control, adjustment of the mA and/or kV according to patient size and/or use of iterative reconstruction technique. COMPARISON:  None Available. FINDINGS: Segmentation: 5 lumbar type vertebrae. Alignment: Normal. Vertebrae: No evidence of acute fracture. Schmorl's node involving superior endplate of L1 and L2. The posterior elements are intact. Paraspinal and other soft tissues: Assessed on concurrent abdominopelvic CT, reported separately. Disc levels: Mild facet hypertrophy and broad-based disc bulge at multiple levels. IMPRESSION: 1. No acute fracture or subluxation of the lumbar spine. 2. Mild degenerative disc disease and facet hypertrophy. Electronically Signed   By: Narda Rutherford M.D.   On: 10/13/2023 20:23   CT ABDOMEN PELVIS WO CONTRAST  Result Date: 10/13/2023 CLINICAL DATA:  Abdominal pain, fall.  Sacral pain. EXAM: CT ABDOMEN AND PELVIS  WITHOUT CONTRAST TECHNIQUE: Multidetector CT imaging of the abdomen and pelvis was performed following the standard protocol without IV contrast. RADIATION DOSE REDUCTION: This exam was performed according to the departmental dose-optimization program which includes automated exposure control, adjustment of the mA and/or kV according to patient size and/or use of iterative reconstruction technique. COMPARISON:  Noncontrast CT 09/02/2021 FINDINGS: Lower chest: Small left pleural effusion demonstrates simple fluid density. There is also a trace right pleural effusion. Hepatobiliary: Assessment for injury is limited in the absence of IV contrast. No evidence of focal hepatic abnormality or perihepatic hematoma. Subtle capsular nodularity of the liver. Multiple gallstones without abnormal gallbladder distension. Pancreas: Mild fat stranding about the pancreatic head and body. No ductal dilatation. Spleen: Unremarkable unenhanced appearance. Adrenals/Urinary Tract: Chronic left adrenal thickening. No evidence of adrenal hemorrhage. Bilateral renal parenchymal atrophy. Symmetric perinephric fat stranding. No renal calculi. Unremarkable urinary bladder. Stomach/Bowel: Moderate gastric distention with fluid. Mild wall thickening about the duodenum adjacent to pancreatic stranding. No small bowel obstruction or obvious small bowel wall thickening. Colonic diverticulosis without diverticulitis. Normal appendix. Vascular/Lymphatic: Aortic atherosclerosis. No periaortic stranding or retroperitoneal fluid to suggest injury. No abdominopelvic adenopathy. Reproductive: Unremarkable. Other: Generalized body wall and subcutaneous edema. Mild edema of the mesenteric fat, more prominent taste in the pancreas. Small fat containing inguinal hernias. Musculoskeletal: Comminuted intertrochanteric right proximal femur fracture with mild displacement. No additional fracture of the pelvis. No sacral fracture. Lumbar spine assessed on  concurrent lumbar spine reformats, reported separately. No fracture of included ribs. IMPRESSION: 1. Comminuted intertrochanteric right proximal femur fracture with mild displacement. 2. No additional traumatic injury in the abdomen/pelvis. 3. Mild fat stranding about the pancreatic head and body. There is also generalized edema of the mesenteric and subcutaneous fat. Recommend correlation with pancreatic enzymes for pancreatitis. 4. Cholelithiasis. Colonic diverticulosis without diverticulitis. 5. Small left and trace right pleural effusions. 6. Subtle capsular nodularity of the liver can be seen with cirrhosis. Recommend correlation for cirrhosis risk factors. Aortic Atherosclerosis (ICD10-I70.0). Electronically Signed  By: Narda Rutherford M.D.   On: 10/13/2023 19:58   DG Femur Min 2 Views Right  Result Date: 10/13/2023 CLINICAL DATA:  Status post fall with right leg pain. EXAM: RIGHT FEMUR 2 VIEWS; PELVIS - 1-2 VIEW COMPARISON:  None Available. FINDINGS: Femur: Displaced intertrochanteric femur fracture. Distal femur is intact. Knee alignment is maintained. Pelvis: No additional fracture of the pelvis. No hip dislocation. Mild bilateral hip osteoarthritis. No pubic symphyseal or sacroiliac diastasis. IMPRESSION: Displaced intertrochanteric femur fracture. Electronically Signed   By: Narda Rutherford M.D.   On: 10/13/2023 19:20   DG Pelvis 1-2 Views  Result Date: 10/13/2023 CLINICAL DATA:  Status post fall with right leg pain. EXAM: RIGHT FEMUR 2 VIEWS; PELVIS - 1-2 VIEW COMPARISON:  None Available. FINDINGS: Femur: Displaced intertrochanteric femur fracture. Distal femur is intact. Knee alignment is maintained. Pelvis: No additional fracture of the pelvis. No hip dislocation. Mild bilateral hip osteoarthritis. No pubic symphyseal or sacroiliac diastasis. IMPRESSION: Displaced intertrochanteric femur fracture. Electronically Signed   By: Narda Rutherford M.D.   On: 10/13/2023 19:20   DG Wrist  Complete Left  Result Date: 10/12/2023 CLINICAL DATA:  rule out scaphoid fx; add scaphoid view EXAM: LEFT WRIST - COMPLETE 3+ VIEW COMPARISON:  None Available. FINDINGS: Normal alignment. No acute fracture. The soft tissues are unremarkable. IMPRESSION: No acute osseous abnormality. Electronically Signed   By: Olive Bass M.D.   On: 10/12/2023 16:36   [place] LIMITED JOINT SPACE STRUCTURES LOW LEFT  Result Date: 10/12/2023 Ultrasound of the left knee INDICATION: Lt knee pain and swelling Findings: Small effusion in the suprapatellar pouch Patellar and quadriceps tendons both with trace hypoechoic intratendinous changes and some mild hypoechoic fluid surrounding the tendons Some soft tissue edema of the Hoffa's fat pad Summary: Patella tendinitis with a small effusion. Ultrasound and interpretation by Dr. Webb Silversmith and Dr. Christella Hartigan Limited US of the Left Wrist: Indications: Lt wrist pain s/p fall, r/o possible fx FINDINGS: 1st compartment: visualized in SAX and LAX.  APL and EPB fibers intact with some hypoechoic fluid surrounding the tendon from the distal radius to the wrist joint 2nd compartment: ECRB and ECRL visualized and LAX and SAX.  There is some mild hypoechoic fluid surrounding the tendons at the level of the wrist joint.  All fibers are intact 3rd compartment: EPL with some trace hypoechoic fluid at the distal insertion seen in SAX and LAX 4th compartment: ED and EI visualized and SA X and LAX.  There is hypoechoic fluid changes within the tendon sheath from Lister's tubercle down to the distal insertion points. Scaphoid: Visualized in LAX and SAX there is some hypoechoic fluid overlying the ostium.  No defects of the ostium appreciated. Summary: Findings consistent with tendinosis of the 1-4 compartments.  All fibers are intact.  Worsened inflammation around the extensor digitorum at the level of the wrist joint.  There is some mild edema over the dorsal scaphoid surface as well. Ultrasound and  interpretation by Dr. Webb Silversmith and Dr. Christella Hartigan    Pending Labs Unresulted Labs (From admission, onward)     Start     Ordered   10/14/23 0500  Renal function panel  Tomorrow morning,   R        10/13/23 2223   10/14/23 0500  CBC  Tomorrow morning,   R        10/13/23 2223   10/14/23 0500  Hepatic function panel  Tomorrow morning,   R  10/13/23 2223   10/14/23 0500  VITAMIN D 25 Hydroxy (Vit-D Deficiency, Fractures)  Tomorrow morning,   R        10/13/23 2223   10/13/23 2222  Hemoglobin A1c  Once,   R       Comments: To assess prior glycemic control    10/13/23 2223   10/13/23 2205  HIV Antibody (routine testing w rflx)  (HIV Antibody (Routine testing w reflex) panel)  Once,   R        10/13/23 2223   10/13/23 2036  Urinalysis, Routine w reflex microscopic -Urine, Clean Catch  Once,   URGENT       Question:  Specimen Source  Answer:  Urine, Clean Catch   10/13/23 2035            Vitals/Pain Today's Vitals   10/13/23 1925 10/13/23 1946 10/13/23 2132 10/13/23 2200  BP: (!) 192/93  (!) 210/98 (!) 196/103  Pulse: 97  (!) 103 (!) 105  Resp: 18  19   Temp: 98.2 F (36.8 C)  98.2 F (36.8 C)   TempSrc: Oral  Oral   SpO2: 95%  95% 96%  PainSc:  10-Worst pain ever      Isolation Precautions No active isolations  Medications Medications  acetaminophen (TYLENOL) tablet 650 mg (has no administration in time range)  HYDROmorphone (DILAUDID) injection 1 mg (has no administration in time range)    Or  HYDROmorphone (DILAUDID) injection 0.5 mg (has no administration in time range)  insulin aspart (novoLOG) injection 0-6 Units (has no administration in time range)  fentaNYL (SUBLIMAZE) injection 50 mcg (50 mcg Intramuscular Given 10/13/23 1601)  oxyCODONE-acetaminophen (PERCOCET/ROXICET) 5-325 MG per tablet 1 tablet (1 tablet Oral Given 10/13/23 1822)  HYDROmorphone (DILAUDID) injection 0.5 mg (0.5 mg Intravenous Given 10/13/23 2000)  sodium chloride 0.9 % bolus 1,000 mL (0  mLs Intravenous Stopped 10/13/23 2218)    Mobility non-ambulatory     Focused Assessments    R Recommendations: See Admitting Provider Note  Report given to:   Additional Notes: right BTK amputee
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
## 2023-10-13 NOTE — ED Notes (Signed)
Transport requested

## 2023-10-13 NOTE — ED Triage Notes (Signed)
Brought in by EMS from a gas station after a fall. Pt states he lost his balance and landed on concrete ground with buttocks first. Complains of sacral pain and right leg pain. Right leg prosthesis in place. No LOC. Pt did not hit his head. Alert and oriented x 4.
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
## 2023-10-13 NOTE — ED Provider Notes (Signed)
[place] [facility]ERGENC[facility] DEPARTMENT AT [facility] Provider Note   CSN: [id number] Arrival date & time: 10/13/23  1521     History  Chief Complaint  Patient presents with   [name] [name]    [name] [name] is a 52 y.o. male.  With a history of CKD, peripheral artery disease, anemia on Retacrit, diabetes, hypertension, right BKA presenting to the ED for evaluation of a fall.  He was standing outside a gas station when he lost his balance and fell backwards.  He landed on his buttock.  He did not hit his head or lose consciousness.  Does not take any blood thinners.  He is complaining of right hip and leg pain.  Right lower extremity typically does not hurt.  He denies back pain or abdominal pain.   Fall       Home Medications Prior to Admission medications   Medication Sig Start Date End Date Taking? Authorizing Provider  amLODipine (NORVASC) 5 MG tablet TAKE 1 TABLET(5 MG) BY MOUTH AT BEDTIME Patient taking differently: Take 5 mg by mouth daily. 10/12/23  Yes [name], MD  atorvastatin (LIPITOR) 40 MG tablet Take 1 tablet (40 mg total) by mouth at bedtime. Patient taking differently: Take 40 mg by mouth daily. 12/14/22  Yes [name], MD  baclofen (LIORESAL) 10 MG tablet TAKE 1/2 TABLET BY MOUTH TWICE DAILY Patient taking differently: Take 10 mg by mouth daily. 10/04/23  Yes [name], MD  calcitRIOL (ROCALTROL) 0.5 MCG capsule Take 0.5 mcg by mouth in the morning and at bedtime. 09/08/22  Yes [provider]  carvedilol (COREG) 12.5 MG tablet Take 12.5 mg by mouth 2 (two) times daily with a meal.   Yes [provider]  diltiazem (CARDIZEM CD) 300 MG 24 hr capsule TAKE 1 CAPSULE(300 MG) BY MOUTH AT BEDTIME 10/21/22  Yes Ursuy, Renee Lynn, PA-C  FARXIGA 10 MG TABS tablet TAKE 1 TABLET BY MOUTH EVERY DAY 09/15/23  Yes [name], MD  FLUoxetine (PROZAC) 10 MG capsule TAKE 1 CAPSULE(10 MG) BY MOUTH DAILY Patient taking differently: Take  10 mg by mouth daily. 10/04/23  Yes [name], MD  furosemide (LASIX) 40 MG tablet Take 40 mg by mouth daily.   Yes [provider]  gabapentin (NEURONTIN) 300 MG capsule TAKE 1 CAPSULE(300 MG) BY MOUTH DAILY 05/26/23  Yes [name], MD  HUMALOG KWIKPEN 100 UNIT/ML KwikPen Inject 6 Units into the skin 2 (two) times daily before lunch and supper. Dose per sliding scale 02/25/23  Yes McDiarmid, Leighton Roach, MD  hydrALAZINE (APRESOLINE) 50 MG tablet TAKE 1 AND 1/2 TABLETS(75 MG) BY MOUTH THREE TIMES DAILY Patient taking differently: Take 100 mg by mouth in the morning and at bedtime. 07/06/23  Yes Alicia Amel, MD  Insulin Disposable Pump (OMNIPOD DASH PODS, GEN 4,) MISC  04/15/22  Yes [provider]  metoprolol succinate (TOPROL XL) 50 MG 24 hr tablet TAKE 100 MG IN THE AM AND TAKE 50 MG IN THE PM Patient taking differently: Take 50 mg by mouth in the morning and at bedtime. 07/19/23  Yes Sheilah Pigeon, PA-C  ondansetron (ZOFRAN-ODT) 8 MG disintegrating tablet DISSOLVE 1 TABLET(8 MG) ON THE TONGUE EVERY 8 HOURS AS NEEDED FOR NAUSEA OR VOMITING 08/12/23  Yes [name], MD  sevelamer carbonate (RENVELA) 800 MG tablet Take 2,400 mg by mouth in the morning and at bedtime.   Yes [provider]  Chad Cordial ULTRA-THIN LANCETS MISC Check blood sugars before meals  twice daily 11/19/17   Julieanne Manson, MD  Blood Glucose Monitoring Suppl (ACCU-CHEK GUIDE ME) w/Device KIT See admin instructions. 02/23/23   [provider]  glucose blood (AGAMATRIX PRESTO TEST) test strip Check blood sugars twice daily before meals 02/02/18   Julieanne Manson, MD  glucose blood test strip 1 each by Other route as needed for other. Use as instructed    [provider]      Allergies    Iron and Tape    Review of Systems   Review of Systems  Musculoskeletal:  Positive for arthralgias and myalgias.  All other systems reviewed and are negative.   Physical  Exam Updated Vital Signs BP (!) 196/103   Pulse (!) 105   Temp 98.2 F (36.8 C) (Oral)   Resp 19   SpO2 96%  Physical Exam Vitals and nursing note reviewed. Exam conducted with a chaperone present [name] [name], Char[employer] fundraiser).  Constitutional:      General: He is not in acute distress.    Appearance: Normal appearance. He is obese. He is not ill-appearing.     Comments: Resting comfortably in bed  HENT:     Head: Normocephalic and atraumatic.  Pulmonary:     Effort: Pulmonary effort is normal. No respiratory distress.  Abdominal:     General: Abdomen is flat.  Genitourinary:    Comments: Normal rectal tone.  No obvious external lesions.  No masses or step-offs.  Observed stool is brown. Musculoskeletal:        General: Normal range of motion.     Cervical back: Neck supple.     Comments: Significant TTP to right upper leg.  Right BKA with prosthesis in place.  Mild TTP to right hip.  Poor ROM.  Compartments are soft.  No significant TTP to low back or midline L-spine.  Skin:    General: Skin is warm and dry.  Neurological:     Mental Status: He is alert and oriented to person, place, and time.  Psychiatric:        Mood and Affect: Mood normal.        Behavior: Behavior normal.     ED Results / Procedures / Treatments   Labs (all labs ordered are listed, but only abnormal results are displayed) Labs Reviewed  BASIC METABOLIC PANEL - Abnormal; Notable for the following components:      Result Value   Chloride 114 (*)    CO2 17 (*)    Glucose, Bld 216 (*)    BUN 78 (*)    Creatinine, Ser 9.53 (*)    Calcium 7.8 (*)    GFR, Estimated 6 (*)    All other components within normal limits  CBC WITH DIFFERENTIAL/PLATELET - Abnormal; Notable for the following components:   WBC 14.3 (*)    RBC 2.50 (*)    Hemoglobin 7.0 (*)    HCT 23.4 (*)    MCHC 29.9 (*)    RDW 16.5 (*)    Neutro Abs 11.7 (*)    Abs Immature Granulocytes 0.29 (*)    All other components within normal limits  BRAIN  NATRIURETIC PEPTIDE - Abnormal; Notable for the following components:   B Natriuretic Peptide 279.3 (*)    All other components within normal limits  PROTIME-INR  LIPASE, BLOOD  URINALYSIS, ROUTINE W REFLEX MICROSCOPIC  HIV ANTIBODY (ROUTINE TESTING W REFLEX)  RENAL FUNCTION PANEL  CBC  HEPATIC FUNCTION PANEL  VITAMIN D 25 HYDROXY (VIT D DEFICIENCY, FRACTURES)  HEMOGLOBIN A1C  POC OCCULT BLOOD, ED    EKG None  Radiology CT L-SPINE NO CHARGE  Result Date: 10/13/2023 CLINICAL DATA:  Pain after fall. EXAM: CT LUMBAR SPINE WITHOUT CONTRAST TECHNIQUE: Multidetector CT imaging of the lumbar spine was performed without intravenous contrast administration. Multiplanar CT image reconstructions were also generated. RADIATION DOSE REDUCTION: This exam was performed according to the departmental dose-optimization program which includes automated exposure control, adjustment of the mA and/or kV according to patient size and/or use of iterative reconstruction technique. COMPARISON:  None Available. FINDINGS: Segmentation: 5 lumbar type vertebrae. Alignment: Normal. Vertebrae: No evidence of acute fracture. Schmorl's node involving superior endplate of L1 and L2. The posterior elements are intact. Paraspinal and other soft tissues: Assessed on concurrent abdominopelvic CT, reported separately. Disc levels: Mild facet hypertrophy and broad-based disc bulge at multiple levels. IMPRESSION: 1. No acute fracture or subluxation of the lumbar spine. 2. Mild degenerative disc disease and facet hypertrophy. Electronically Signed   By: Narda Rutherford M.D.   On: 10/13/2023 20:23   CT ABDOMEN PELVIS WO CONTRAST  Result Date: 10/13/2023 CLINICAL DATA:  Abdominal pain, fall.  Sacral pain. EXAM: CT ABDOMEN AND PELVIS WITHOUT CONTRAST TECHNIQUE: Multidetector CT imaging of the abdomen and pelvis was performed following the standard protocol without IV contrast. RADIATION DOSE REDUCTION: This exam was performed  according to the departmental dose-optimization program which includes automated exposure control, adjustment of the mA and/or kV according to patient size and/or use of iterative reconstruction technique. COMPARISON:  Noncontrast CT 09/02/2021 FINDINGS: Lower chest: Small left pleural effusion demonstrates simple fluid density. There is also a trace right pleural effusion. Hepatobiliary: Assessment for injury is limited in the absence of IV contrast. No evidence of focal hepatic abnormality or perihepatic hematoma. Subtle capsular nodularity of the liver. Multiple gallstones without abnormal gallbladder distension. Pancreas: Mild fat stranding about the pancreatic head and body. No ductal dilatation. Spleen: Unremarkable unenhanced appearance. Adrenals/Urinary Tract: Chronic left adrenal thickening. No evidence of adrenal hemorrhage. Bilateral renal parenchymal atrophy. Symmetric perinephric fat stranding. No renal calculi. Unremarkable urinary bladder. Stomach/Bowel: Moderate gastric distention with fluid. Mild wall thickening about the duodenum adjacent to pancreatic stranding. No small bowel obstruction or obvious small bowel wall thickening. Colonic diverticulosis without diverticulitis. Normal appendix. Vascular/Lymphatic: Aortic atherosclerosis. No periaortic stranding or retroperitoneal fluid to suggest injury. No abdominopelvic adenopathy. Reproductive: Unremarkable. Other: Generalized body wall and subcutaneous edema. Mild edema of the mesenteric fat, more prominent taste in the pancreas. Small fat containing inguinal hernias. Musculoskeletal: Comminuted intertrochanteric right proximal femur fracture with mild displacement. No additional fracture of the pelvis. No sacral fracture. Lumbar spine assessed on concurrent lumbar spine reformats, reported separately. No fracture of included ribs. IMPRESSION: 1. Comminuted intertrochanteric right proximal femur fracture with mild displacement. 2. No additional  traumatic injury in the abdomen/pelvis. 3. Mild fat stranding about the pancreatic head and body. There is also generalized edema of the mesenteric and subcutaneous fat. Recommend correlation with pancreatic enzymes for pancreatitis. 4. Cholelithiasis. Colonic diverticulosis without diverticulitis. 5. Small left and trace right pleural effusions. 6. Subtle capsular nodularity of the liver can be seen with cirrhosis. Recommend correlation for cirrhosis risk factors. Aortic Atherosclerosis (ICD10-I70.0). Electronically Signed   By: Narda Rutherford M.D.   On: 10/13/2023 19:58   DG Femur Min 2 Views Right  Result Date: 10/13/2023 CLINICAL DATA:  Status post fall with right leg pain. EXAM: RIGHT FEMUR 2 VIEWS; PELVIS - 1-2 VIEW COMPARISON:  None Available. FINDINGS: Femur: Displaced intertrochanteric  femur fracture. Distal femur is intact. Knee alignment is maintained. Pelvis: No additional fracture of the pelvis. No hip dislocation. Mild bilateral hip osteoarthritis. No pubic symphyseal or sacroiliac diastasis. IMPRESSION: Displaced intertrochanteric femur fracture. Electronically Signed   By: Narda Rutherford M.D.   On: 10/13/2023 19:20   DG Pelvis 1-2 Views  Result Date: 10/13/2023 CLINICAL DATA:  Status post fall with right leg pain. EXAM: RIGHT FEMUR 2 VIEWS; PELVIS - 1-2 VIEW COMPARISON:  None Available. FINDINGS: Femur: Displaced intertrochanteric femur fracture. Distal femur is intact. Knee alignment is maintained. Pelvis: No additional fracture of the pelvis. No hip dislocation. Mild bilateral hip osteoarthritis. No pubic symphyseal or sacroiliac diastasis. IMPRESSION: Displaced intertrochanteric femur fracture. Electronically Signed   By: Narda Rutherford M.D.   On: 10/13/2023 19:20   DG Wrist Complete Left  Result Date: 10/12/2023 CLINICAL DATA:  rule out scaphoid fx; add scaphoid view EXAM: LEFT WRIST - COMPLETE 3+ VIEW COMPARISON:  None Available. FINDINGS: Normal alignment. No acute  fracture. The soft tissues are unremarkable. IMPRESSION: No acute osseous abnormality. Electronically Signed   By: Olive Bass M.D.   On: 10/12/2023 16:36   [place] LIMITED JOINT SPACE STRUCTURES LOW LEFT  Result Date: 10/12/2023 Ultrasound of the left knee INDICATION: Lt knee pain and swelling Findings: Small effusion in the suprapatellar pouch Patellar and quadriceps tendons both with trace hypoechoic intratendinous changes and some mild hypoechoic fluid surrounding the tendons Some soft tissue edema of the Hoffa's fat pad Summary: Patella tendinitis with a small effusion. Ultrasound and interpretation by Dr. Webb Silversmith and Dr. Christella Hartigan Limited US of the Left Wrist: Indications: Lt wrist pain s/p fall, r/o possible fx FINDINGS: 1st compartment: visualized in SAX and LAX.  APL and EPB fibers intact with some hypoechoic fluid surrounding the tendon from the distal radius to the wrist joint 2nd compartment: ECRB and ECRL visualized and LAX and SAX.  There is some mild hypoechoic fluid surrounding the tendons at the level of the wrist joint.  All fibers are intact 3rd compartment: EPL with some trace hypoechoic fluid at the distal insertion seen in SAX and LAX 4th compartment: ED and EI visualized and SA X and LAX.  There is hypoechoic fluid changes within the tendon sheath from Lister's tubercle down to the distal insertion points. Scaphoid: Visualized in LAX and SAX there is some hypoechoic fluid overlying the ostium.  No defects of the ostium appreciated. Summary: Findings consistent with tendinosis of the 1-4 compartments.  All fibers are intact.  Worsened inflammation around the extensor digitorum at the level of the wrist joint.  There is some mild edema over the dorsal scaphoid surface as well. Ultrasound and interpretation by Dr. Webb Silversmith and Dr. Christella Hartigan    Procedures Procedures    Medications Ordered in ED Medications  acetaminophen (TYLENOL) tablet 650 mg (has no administration in time range)  HYDROmorphone  (DILAUDID) injection 1 mg (has no administration in time range)    Or  HYDROmorphone (DILAUDID) injection 0.5 mg (has no administration in time range)  insulin aspart (novoLOG) injection 0-6 Units (has no administration in time range)  fentaNYL (SUBLIMAZE) injection 50 mcg (50 mcg Intramuscular Given 10/13/23 1601)  oxyCODONE-acetaminophen (PERCOCET/ROXICET) 5-325 MG per tablet 1 tablet (1 tablet Oral Given 10/13/23 1822)  HYDROmorphone (DILAUDID) injection 0.5 mg (0.5 mg Intravenous Given 10/13/23 2000)  sodium chloride 0.9 % bolus 1,000 mL (0 mLs Intravenous Stopped 10/13/23 2218)    ED Course/ Medical Decision Making/ A&P Clinical Course as of 10/13/23 2235  Wed Oct 13, 2023  2136 Discussed with family medicine residency program will admit [AS]    Clinical Course User Index [AS] Summerlynn Glauser, Edsel Petrin, PA-C                                 Medical Decision Making Amount and/or Complexity of Data Reviewed Labs: ordered. Radiology: ordered.  Risk Prescription drug management.  This patient presents to the ED for concern of fall, right leg pain, this involves an extensive number of treatment options, and is a complaint that carries with it a high risk of complications and morbidity.  The differential diagnosis includes fracture, strain, sprain, contusion, dislocation  My initial workup includes imaging, pain control  Additional history obtained from: Nursing notes from this visit. EMS provides a portion of the history  I ordered, reviewed and interpreted labs which include: BMP, CBC, INR, lipase.  Creatinine significantly elevated from baseline at 9.53.  BUN is 78.  Bicarb chronically decreased, at 17.  Hyperglycemia of 216.  Leukocytosis of 14.3.  Anemia with a hemoglobin of 7.0 from baseline of 9.5.  I ordered imaging studies including x-ray right femur, hip and pelvis, CT abdomen pelvis, CT L-spine I independently visualized and interpreted imaging which showed intertrochanteric  fracture of the right hip.  Mild fat stranding about the pancreatic body.  Cholelithiasis, diverticulosis, capsular nodularity of the liver I agree with the radiologist interpretation  Consultations Obtained:  I requested consultation with the orthopedics Dr. Linna Caprice,  and discussed lab and imaging findings as well as pertinent plan - they recommend: Admission  Requested consultation with family medicine residency group.  They will evaluate patient for admission.  Afebrile, hypertensive but otherwise hemodynamically stable.  52 year old male presenting for evaluation of a mechanical fall from ground-level.  Larey Seat backwards and landed on his buttocks.  Currently complaining of right leg pain.  He was initially not complaining of abdominal pain, however had significant abdominal tenderness to palpation.  Imaging reveals intertrochanteric fracture of the right femur.  Orthopedics was consulted and recommends admission to hospitalist.  Lab workup concerning for acute on chronic anemia with hemoglobin of 7 from baseline of 9.5.  He denies melena, hematemesis, hematochezia, hematuria.  He denies any syncope or shortness of breath.  Rectal exam reveals brown stool.  No melena..  Creatinine significantly elevated from baseline at 9.53.  Office visit with his primary care provider yesterday revealed uncontrolled hypertension.  Dialysis was discussed with the patient.  He is not currently on dialysis.  Unclear etiology of his anemia.  Will admit patient to family medicine residency program.  Patient is in agreement with this plan.  Stable at the time of admission.  Patient's case discussed with Dr. Earlene Plater who agrees with plan to admit.   Note: Portions of this report may have been transcribed using voice recognition software. Every effort was made to ensure accuracy; however, inadvertent computerized transcription errors may still be present.        Final Clinical Impression(s) / ED Diagnoses Final  diagnoses:  Closed fracture of right hip, initial encounter [facility])  Fall, initial encounter  Anemia, unspecified type    Rx / DC Orders ED Discharge Orders     None         Mora Bellman 10/13/23 2235    Laurence Spates, MD 10/14/23 1504

## 2023-10-13 NOTE — ED Notes (Signed)
Report given to Hector RN

## 2023-10-14 ENCOUNTER — Ambulatory Visit (HOSPITAL_COMMUNITY): Admission: RE | Admit: 2023-10-14 | Payer: 59 | Source: Ambulatory Visit | Admitting: Orthopedic Surgery

## 2023-10-14 ENCOUNTER — Other Ambulatory Visit: Payer: Self-pay

## 2023-10-14 ENCOUNTER — Inpatient Hospital Stay (HOSPITAL_COMMUNITY): Payer: 59

## 2023-10-14 ENCOUNTER — Encounter (HOSPITAL_COMMUNITY): Admission: RE | Payer: Self-pay | Source: Ambulatory Visit

## 2023-10-14 ENCOUNTER — Encounter (HOSPITAL_COMMUNITY): Payer: Self-pay

## 2023-10-14 DIAGNOSIS — Z79899 Other long term (current) drug therapy: Secondary | ICD-10-CM

## 2023-10-14 DIAGNOSIS — S72141A Displaced intertrochanteric fracture of right femur, initial encounter for closed fracture: Principal | ICD-10-CM

## 2023-10-14 DIAGNOSIS — N185 Chronic kidney disease, stage 5: Secondary | ICD-10-CM | POA: Diagnosis not present

## 2023-10-14 DIAGNOSIS — F17210 Nicotine dependence, cigarettes, uncomplicated: Secondary | ICD-10-CM | POA: Diagnosis not present

## 2023-10-14 DIAGNOSIS — R10819 Abdominal tenderness, unspecified site: Secondary | ICD-10-CM

## 2023-10-14 DIAGNOSIS — E118 Type 2 diabetes mellitus with unspecified complications: Secondary | ICD-10-CM | POA: Insufficient documentation

## 2023-10-14 DIAGNOSIS — S72001A Fracture of unspecified part of neck of right femur, initial encounter for closed fracture: Secondary | ICD-10-CM | POA: Diagnosis not present

## 2023-10-14 DIAGNOSIS — E1122 Type 2 diabetes mellitus with diabetic chronic kidney disease: Secondary | ICD-10-CM | POA: Diagnosis not present

## 2023-10-14 DIAGNOSIS — R339 Retention of urine, unspecified: Secondary | ICD-10-CM

## 2023-10-14 DIAGNOSIS — Z7982 Long term (current) use of aspirin: Secondary | ICD-10-CM

## 2023-10-14 DIAGNOSIS — E119 Type 2 diabetes mellitus without complications: Secondary | ICD-10-CM | POA: Insufficient documentation

## 2023-10-14 DIAGNOSIS — Z9889 Other specified postprocedural states: Secondary | ICD-10-CM

## 2023-10-14 HISTORY — DX: Abdominal tenderness, unspecified site: R10.819

## 2023-10-14 HISTORY — DX: Retention of urine, unspecified: R33.9

## 2023-10-14 LAB — CBC
HCT: 21.2 % — ABNORMAL LOW (ref 39.0–52.0)
HCT: 24 % — ABNORMAL LOW (ref 39.0–52.0)
Hemoglobin: 6.3 g/dL — CL (ref 13.0–17.0)
Hemoglobin: 7.5 g/dL — ABNORMAL LOW (ref 13.0–17.0)
MCH: 27.5 pg (ref 26.0–34.0)
MCH: 28.8 pg (ref 26.0–34.0)
MCHC: 29.7 g/dL — ABNORMAL LOW (ref 30.0–36.0)
MCHC: 31.3 g/dL (ref 30.0–36.0)
MCV: 92.6 fL (ref 80.0–100.0)
MCV: 92.3 fL (ref 80.0–100.0)
Platelets: 158 10*3/uL (ref 150–400)
Platelets: 134 10*3/uL — ABNORMAL LOW (ref 150–400)
RBC: 2.29 MIL/uL — ABNORMAL LOW (ref 4.22–5.81)
RBC: 2.6 MIL/uL — ABNORMAL LOW (ref 4.22–5.81)
RDW: 16.6 % — ABNORMAL HIGH (ref 11.5–15.5)
RDW: 17 % — ABNORMAL HIGH (ref 11.5–15.5)
WBC: 10.5 10*3/uL (ref 4.0–10.5)
WBC: 11.8 10*3/uL — ABNORMAL HIGH (ref 4.0–10.5)
nRBC: 0 % (ref 0.0–0.2)
nRBC: 0 % (ref 0.0–0.2)

## 2023-10-14 LAB — HEMOGLOBIN AND HEMATOCRIT, BLOOD
HCT: 25.6 % — ABNORMAL LOW (ref 39.0–52.0)
Hemoglobin: 7.8 g/dL — ABNORMAL LOW (ref 13.0–17.0)

## 2023-10-14 LAB — HEPATIC FUNCTION PANEL
ALT: 10 U/L (ref 0–44)
AST: 8 U/L — ABNORMAL LOW (ref 15–41)
Albumin: 2.3 g/dL — ABNORMAL LOW (ref 3.5–5.0)
Alkaline Phosphatase: 50 U/L (ref 38–126)
Bilirubin, Direct: <0.1 mg/dL (ref 0.0–0.2)
Total Bilirubin: 0.6 mg/dL (ref <1.2)
Total Protein: 5.2 g/dL — ABNORMAL LOW (ref 6.5–8.1)

## 2023-10-14 LAB — RENAL FUNCTION PANEL
Albumin: 2.4 g/dL — ABNORMAL LOW (ref 3.5–5.0)
Anion gap: 9 (ref 5–15)
BUN: 76 mg/dL — ABNORMAL HIGH (ref 6–20)
CO2: 17 mmol/L — ABNORMAL LOW (ref 22–32)
Calcium: 7.5 mg/dL — ABNORMAL LOW (ref 8.9–10.3)
Chloride: 113 mmol/L — ABNORMAL HIGH (ref 98–111)
Creatinine, Ser: 10.16 mg/dL — ABNORMAL HIGH (ref 0.61–1.24)
GFR, Estimated: 6 mL/min — ABNORMAL LOW (ref >60)
Glucose, Bld: 120 mg/dL — ABNORMAL HIGH (ref 70–99)
Phosphorus: 6.3 mg/dL — ABNORMAL HIGH (ref 2.5–4.6)
Potassium: 4.9 mmol/L (ref 3.5–5.1)
Sodium: 139 mmol/L (ref 135–145)

## 2023-10-14 LAB — VITAMIN D 25 HYDROXY (VIT D DEFICIENCY, FRACTURES): Vit D, 25-Hydroxy: 10.22 ng/mL — ABNORMAL LOW (ref 30–100)

## 2023-10-14 LAB — GLUCOSE, CAPILLARY
Glucose-Capillary: 101 mg/dL — ABNORMAL HIGH (ref 70–99)
Glucose-Capillary: 103 mg/dL — ABNORMAL HIGH (ref 70–99)
Glucose-Capillary: 108 mg/dL — ABNORMAL HIGH (ref 70–99)
Glucose-Capillary: 142 mg/dL — ABNORMAL HIGH (ref 70–99)
Glucose-Capillary: 171 mg/dL — ABNORMAL HIGH (ref 70–99)
Glucose-Capillary: 192 mg/dL — ABNORMAL HIGH (ref 70–99)

## 2023-10-14 LAB — HIV ANTIBODY (ROUTINE TESTING W REFLEX): HIV Screen 4th Generation wRfx: NONREACTIVE

## 2023-10-14 LAB — FERRITIN: Ferritin: 39 ng/mL (ref 24–336)

## 2023-10-14 LAB — IRON AND TIBC
Iron: 19 ug/dL — ABNORMAL LOW (ref 45–182)
Saturation Ratios: 8 % — ABNORMAL LOW (ref 17.9–39.5)
TIBC: 225 ug/dL — ABNORMAL LOW (ref 250–450)
UIBC: 206 ug/dL

## 2023-10-14 LAB — PREPARE RBC (CROSSMATCH)

## 2023-10-14 LAB — PHOSPHORUS: Phosphorus: 5.9 mg/dL — ABNORMAL HIGH (ref 2.5–4.6)

## 2023-10-14 SURGERY — ULNAR NERVE DECOMPRESSION/TRANSPOSITION
Anesthesia: Choice | Laterality: Left

## 2023-10-14 MED ORDER — FLUOXETINE HCL 10 MG PO CAPS
10.0000 mg | ORAL_CAPSULE | Freq: Every day | ORAL | Status: DC
  Administered 2023-10-15 – 2023-10-21 (×7): 10 mg via ORAL
  Filled 2023-10-14 (×7): qty 1

## 2023-10-14 MED ORDER — POVIDONE-IODINE 10 % EX SWAB: 2.0000 | Freq: Once | CUTANEOUS | Status: DC

## 2023-10-14 MED ORDER — TRANEXAMIC ACID-NACL 1000-0.7 MG/100ML-% IV SOLN: 1000.0000 mg | INTRAVENOUS | Status: DC

## 2023-10-14 MED ORDER — SODIUM CHLORIDE 0.9 % IV SOLN
100.0000 mg | INTRAVENOUS | Status: DC
  Administered 2023-10-16 – 2023-10-17 (×2): 100 mg via INTRAVENOUS
  Filled 2023-10-14 (×3): qty 5

## 2023-10-14 MED ORDER — HYDROMORPHONE HCL 1 MG/ML IJ SOLN
1.0000 mg | Freq: Once | INTRAMUSCULAR | Status: AC
  Administered 2023-10-14: 1 mg via INTRAVENOUS
  Filled 2023-10-14: qty 1

## 2023-10-14 MED ORDER — CHLORHEXIDINE GLUCONATE 4 % EX SOLN
60.0000 mL | Freq: Once | CUTANEOUS | Status: AC
  Administered 2023-10-15: 4 via TOPICAL
  Filled 2023-10-14: qty 60

## 2023-10-14 MED ORDER — CHLORHEXIDINE GLUCONATE 4 % EX SOLN
60.0000 mL | Freq: Once | CUTANEOUS | Status: DC
Start: 2023-10-14 — End: 2023-10-14

## 2023-10-14 MED ORDER — MUPIROCIN 2 % EX OINT
1.0000 | TOPICAL_OINTMENT | Freq: Two times a day (BID) | CUTANEOUS | Status: DC
  Administered 2023-10-14 (×2): 1 via NASAL
  Filled 2023-10-14: qty 22

## 2023-10-14 MED ORDER — HYDROMORPHONE HCL 1 MG/ML IJ SOLN: 0.5000 mg | INTRAMUSCULAR | Status: DC | PRN

## 2023-10-14 MED ORDER — INFLUENZA VIRUS VACC SPLIT PF (FLUZONE) 0.5 ML IM SUSY: 0.5000 mL | PREFILLED_SYRINGE | INTRAMUSCULAR | Status: DC

## 2023-10-14 MED ORDER — HYDROMORPHONE HCL 1 MG/ML IJ SOLN
1.0000 mg | INTRAMUSCULAR | Status: DC | PRN
  Administered 2023-10-14 – 2023-10-15 (×5): 1 mg via INTRAVENOUS
  Filled 2023-10-14 (×5): qty 1

## 2023-10-14 MED ORDER — CEFAZOLIN SODIUM-DEXTROSE 2-4 GM/100ML-% IV SOLN
2.0000 g | INTRAVENOUS | Status: AC
  Administered 2023-10-15: 2 g via INTRAVENOUS

## 2023-10-14 MED ORDER — POVIDONE-IODINE 10 % EX SWAB
2.0000 | Freq: Once | CUTANEOUS | Status: DC
Start: 2023-10-14 — End: 2023-10-14

## 2023-10-14 NOTE — Assessment & Plan Note (Signed)
>>  ASSESSMENT AND PLAN FOR T2DM (TYPE 2 DIABETES MELLITUS) (HCC) WRITTEN ON 10/14/2023  9:03 AM BY SHITAREV, DIMITRY, MD  A1c 6.1%.  CBGs in normal range. Holding Farxiga  Holding long-acting insulin , restart as indicated CBGs, SSI very sensitive

## 2023-10-14 NOTE — Assessment & Plan Note (Deleted)
Secondary to diabetes and hypertension.  Cr 9.53 and K 4.9 on admission. Follows with nephrology outpatient and they have been preparing him that he is nearing the point of needing HD.   Refer to [facility] recommended graft but has not been placed due to patient deferral.  Denied transplant in past.  Cr is now significantly worse than it has been, though he tells [place] he is urinating as much as usual.  Last nephrology outpatient note reports concern for mild uremia. I am concerned he may need initiation of HD sooner rather than later given high suspicion for uremia contributing to his frequent falls. History of urinary retention and has been on Flomax in the past so rule out post renal.  Consult nephrology in morning, follows with Dr. Malen Gauze  Daily renal function panel Continue calcitriol and Renvela (increased to 800 3 times daily recently) Holding Farxiga Avoid nephrotoxic agents Strict I's and O's, monitor for urinary output Bladder scan Holding off on further IV fluid until we ensure he is not becoming anuric, high risk of volume overload

## 2023-10-14 NOTE — Assessment & Plan Note (Addendum)
A1c 6.1%.  CBGs in normal range. Holding Ord Holding long-acting insulin, restart as indicated CBGs, SSI very sensitive

## 2023-10-14 NOTE — Consult Note (Addendum)
Reason for Consult:Right hip fx Referring Physician: Levert Feinstein Time called: [id number] Time at bedside: 0856   [name] [name] is an 52 y.o. male.  HPI: [name] [name] fell yesterday. He's unsure what caused the fall. He had immediate right hip pain and could not get up. He was brought to the ED where x-rays showed a right hip fx and orthopedic surgery was consulted. He lives at home with family and ambulates with a cane and prosthesis.  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)    Depression    Diabetes mellitus without complication (HCC) 2019   GERD (gastroesophageal reflux disease)    Hyperlipidemia    Hypertension    Necrotizing fasciitis (HCC) 10/13/2017   Neuromuscular disorder (HCC)    neuropathy   PAD (peripheral artery disease) (HCC)    Secondary hyperparathyroidism (HCC)    Wound dehiscence 11/24/2017    Past Surgical History:  Procedure Laterality Date   AMPUTATION Right 10/13/2017   Procedure: RIGHT BELOW KNEE AMPUTATION;  Surgeon: Nadara Mustard, MD;  Location: [facility] OR;  Service: Orthopedics;  Laterality: Right;   AMPUTATION Right 11/24/2017   Procedure: AMPUTATION BELOW KNEE REVISION;  Surgeon: Nadara Mustard, MD;  Location: [facility] OR;  Service: Orthopedics;  Laterality: Right;   AMPUTATION TOE Left    BIOPSY  09/02/2021   Procedure: BIOPSY;  Surgeon: Imogene Burn, MD;  Location: [facility] ENDOSCOPY;  Service: Gastroenterology;;   BIOPSY  02/18/2023   Procedure: BIOPSY;  Surgeon: Benancio Deeds, MD;  Location: [facility] ENDOSCOPY;  Service: Gastroenterology;;   COLONOSCOPY  05/11/2019   ESOPHAGOGASTRODUODENOSCOPY (EGD) WITH PROPOFOL N/A 09/02/2021   Procedure: ESOPHAGOGASTRODUODENOSCOPY (EGD) WITH PROPOFOL;  Surgeon: Imogene Burn, MD;  Location: [facility] ENDOSCOPY;  Service: Gastroenterology;   Laterality: N/A;   ESOPHAGOGASTRODUODENOSCOPY (EGD) WITH PROPOFOL N/A 02/18/2023   Procedure: ESOPHAGOGASTRODUODENOSCOPY (EGD) WITH PROPOFOL;  Surgeon: Benancio Deeds, MD;  Location: [facility] ENDOSCOPY;  Service: Gastroenterology;  Laterality: N/A;   NO PAST SURGERIES     POLYPECTOMY     WISDOM TOOTH EXTRACTION      Family History  Problem Relation Age of Onset   Diabetes Mellitus II Mother    Depression Mother    Colon polyps Mother    Hypertension Mother    High Cholesterol Mother    Heart disease Father        CABG x 4.  04/2017   Diabetes Father    High Cholesterol Father    Hypertension Father    Heart disease Maternal Grandmother    Heart disease Maternal Grandfather    Colon cancer Neg Hx    Esophageal cancer Neg Hx    Liver cancer Neg Hx    Pancreatic cancer Neg Hx    Rectal cancer Neg Hx    Stomach cancer Neg Hx     Social History:  reports that he has been smoking cigarettes. He started smoking about 34 years ago. He has a 17.7 pack-year smoking history. He has never used smokeless tobacco. He reports that he does not drink alcohol and does not use drugs.  Allergies:  Allergies  Allergen Reactions   Iron Nausea And Vomiting    With the iron tablets   Tape Rash    Rash at site of tape--okay to use paper tape  Medications: I have reviewed the patient's current medications.  Results for orders placed or performed during the hospital encounter of 10/13/23 (from the past 48 hour(s))  Basic metabolic panel     Status: Abnormal   Collection Time: 10/13/23  7:48 PM  Result Value Ref Range   Sodium 140 135 - 145 mmol/L   Potassium 4.9 3.5 - 5.1 mmol/L   Chloride 114 (H) 98 - 111 mmol/L   CO2 17 (L) 22 - 32 mmol/L   Glucose, Bld 216 (H) 70 - 99 mg/dL    Comment: Glucose reference range applies only to samples taken after fasting for at least 8 hours.   BUN 78 (H) 6 - 20 mg/dL   Creatinine, Ser 9.62 (H) 0.61 - 1.24 mg/dL   Calcium 7.8 (L) 8.9 - 10.3 mg/dL    GFR, Estimated 6 (L) >60 mL/min    Comment: (NOTE) Calculated using the CKD-EPI Creatinine Equation (2021)    Anion gap 9 5 - 15    Comment: Performed at [facility], [address] [place], [place] [address]  CBC with Differential     Status: Abnormal   Collection Time: 10/13/23  7:48 PM  Result Value Ref Range   WBC 14.3 (H) 4.0 - 10.5 K/uL   RBC 2.50 (L) 4.22 - 5.81 MIL/uL   Hemoglobin 7.0 (L) 13.0 - 17.0 g/dL   HCT 13.2 (L) 44.0 - 10.2 %   MCV 93.6 80.0 - 100.0 fL   MCH 28.0 26.0 - 34.0 pg   MCHC 29.9 (L) 30.0 - 36.0 g/dL   RDW 72.5 (H) 36.6 - 44.0 %   Platelets 188 150 - 400 K/uL   nRBC 0.0 0.0 - 0.2 %   Neutrophils Relative % 82 %   Neutro Abs 11.7 (H) 1.7 - 7.7 K/uL   Lymphocytes Relative 9 %   Lymphs Abs 1.3 0.7 - 4.0 K/uL   Monocytes Relative 6 %   Monocytes Absolute 0.8 0.1 - 1.0 K/uL   Eosinophils Relative 1 %   Eosinophils Absolute 0.2 0.0 - 0.5 K/uL   Basophils Relative 0 %   Basophils Absolute 0.0 0.0 - 0.1 K/uL   Immature Granulocytes 2 %   Abs Immature Granulocytes 0.29 (H) 0.00 - 0.07 K/uL    Comment: Performed at [facility], [address] [place], [place] [address]  Brain natriuretic peptide     Status: Abnormal   Collection Time: 10/13/23  7:48 PM  Result Value Ref Range   B Natriuretic Peptide 279.3 (H) 0.0 - 100.0 pg/mL    Comment: Performed at [facility], [address], [place], [place] [address]  Protime-INR     Status: None   Collection Time: 10/13/23  8:08 PM  Result Value Ref Range   Prothrombin Time 13.4 11.4 - 15.2 seconds   INR 1.0 0.8 - 1.2    Comment: (NOTE) INR goal varies based on device and disease states. Performed at [facility], [address] [place], [place] [address]   Lipase, blood     Status: None   Collection Time: 10/13/23  8:08 PM  Result Value Ref Range   Lipase 50 11 - 51 U/L    Comment: Performed at [facility], [address] [place], [place] [address]  Urinalysis, Routine w  reflex microscopic -Urine, Clean Catch     Status: Abnormal   Collection Time: 10/13/23 10:50 PM  Result Value Ref Range  Color, Urine STRAW (A) YELLOW   APPearance CLEAR CLEAR   Specific Gravity, Urine 1.010 1.005 - 1.030   pH 5.0 5.0 - 8.0   Glucose, UA >=500 (A) NEGATIVE mg/dL   Hgb urine dipstick NEGATIVE NEGATIVE   Bilirubin Urine NEGATIVE NEGATIVE   Ketones, ur NEGATIVE NEGATIVE mg/dL   Protein, ur >=161 (A) NEGATIVE mg/dL   Nitrite NEGATIVE NEGATIVE   Leukocytes,Ua NEGATIVE NEGATIVE   RBC / HPF 0-5 0 - 5 RBC/hpf   WBC, UA 0-5 0 - 5 WBC/hpf   Bacteria, UA RARE (A) NONE SEEN   Squamous Epithelial / HPF 0-5 0 - 5 /HPF    Comment: Performed at [facility], [address] [place], [place] [address]  HIV Antibody (routine testing w rflx)     Status: None   Collection Time: 10/13/23 10:50 PM  Result Value Ref Range   HIV Screen 4th Generation wRfx Non Reactive Non Reactive    Comment: Performed at [facility], [address] [place], [place] [address]  Hemoglobin A1c     Status: Abnormal   Collection Time: 10/13/23 10:50 PM  Result Value Ref Range   Hgb A1c MFr Bld 6.1 (H) 4.8 - 5.6 %    Comment: (NOTE) Pre diabetes:          5.7%-6.4%  Diabetes:              >6.4%  Glycemic control for   <7.0% adults with diabetes    Mean Plasma Glucose 128.37 mg/dL    Comment: Performed at [facility], [address] [place], [place] [address]  POC occult blood, ED Provider will collect     Status: None   Collection Time: 10/13/23 10:54 PM  Result Value Ref Range   Fecal Occult Bld NEGATIVE NEGATIVE  Glucose, capillary     Status: Abnormal   Collection Time: 10/14/23 12:14 AM  Result Value Ref Range   Glucose-Capillary 171 (H) 70 - 99 mg/dL    Comment: Glucose reference range applies only to samples taken after fasting for at least 8 hours.  Type and screen [facility]     Status: None (Preliminary result)   Collection Time: 10/14/23  3:23 AM   Result Value Ref Range   ABO/RH(D) A NEG    Antibody Screen NEG    Sample Expiration 10/17/2023,2359    Unit Number [id number]    Blood Component Type RBC LR PHER2    Unit division 00    Status of Unit ISSUED    Transfusion Status OK TO TRANSFUSE    Crossmatch Result      Compatible Performed at [facility], [address], [place], [place] [address]    Unit Number [id number]    Blood Component Type RED CELLS,LR    Unit division 00    Status of Unit ISSUED    Transfusion Status OK TO TRANSFUSE    Crossmatch Result Compatible   Renal function panel     Status: Abnormal   Collection Time: 10/14/23  3:24 AM  Result Value Ref Range   Sodium 139 135 - 145 mmol/L   Potassium 4.9 3.5 - 5.1 mmol/L   Chloride 113 (H) 98 - 111 mmol/L   CO2 17 (L) 22 - 32 mmol/L   Glucose, Bld 120 (H) 70 - 99 mg/dL    Comment: Glucose reference range applies only to samples taken after fasting for at least 8 hours.  BUN 76 (H) 6 - 20 mg/dL   Creatinine, Ser 28.41 (H) 0.61 - 1.24 mg/dL   Calcium 7.5 (L) 8.9 - 10.3 mg/dL   Phosphorus 6.3 (H) 2.5 - 4.6 mg/dL   Albumin 2.4 (L) 3.5 - 5.0 g/dL   GFR, Estimated 6 (L) >60 mL/min    Comment: (NOTE) Calculated using the CKD-EPI Creatinine Equation (2021)    Anion gap 9 5 - 15    Comment: Performed at [facility], [address] [place], [place] [address]  Hepatic function panel     Status: Abnormal   Collection Time: 10/14/23  3:25 AM  Result Value Ref Range   Total Protein 5.2 (L) 6.5 - 8.1 g/dL   Albumin 2.3 (L) 3.5 - 5.0 g/dL   AST 8 (L) 15 - 41 U/L   ALT 10 0 - 44 U/L   Alkaline Phosphatase 50 38 - 126 U/L   Total Bilirubin 0.6 <1.2 mg/dL   Bilirubin, Direct <1.0 0.0 - 0.2 mg/dL   Indirect Bilirubin NOT CALCULATED 0.3 - 0.9 mg/dL    Comment: Performed at [facility], [address] [place], [place] [address]  VITAMIN D 25 Hydroxy (Vit-D Deficiency, Fractures)     Status: Abnormal   Collection Time: 10/14/23  3:25 AM   Result Value Ref Range   Vit D, 25-Hydroxy 10.22 (L) 30 - 100 ng/mL    Comment: (NOTE) Vitamin D deficiency has been defined by the Institute of Medicine  and an Endocrine Society practice guideline as a level of serum 25-OH  vitamin D less than 20 ng/mL (1,2). The Endocrine Society went on to  further define vitamin D insufficiency as a level between 21 and 29  ng/mL (2).  1. IOM (Institute of Medicine). 2010. Dietary reference intakes for  calcium and D. [place]: [employer]. 2. Holick MF, Binkley [place], Bischoff-Ferrari HA, et al. Evaluation,  treatment, and prevention of vitamin D deficiency: an Endocrine  Society clinical practice guideline, JCEM. 2011 Jul; 96(7): 1911-30.  Performed at [facility], [address] [place], [place] [address]   Iron and TIBC     Status: Abnormal   Collection Time: 10/14/23  3:25 AM  Result Value Ref Range   Iron 19 (L) 45 - 182 ug/dL   TIBC 347 (L) 425 - 956 ug/dL   Saturation Ratios 8 (L) 17.9 - 39.5 %   UIBC 206 ug/dL    Comment: Performed at [facility], [address] [place], [place] [address]  Ferritin     Status: None   Collection Time: 10/14/23  3:25 AM  Result Value Ref Range   Ferritin 39 24 - 336 ng/mL    Comment: Performed at [facility], [address] [place], [place] [address]  Prepare RBC (crossmatch)     Status: None   Collection Time: 10/14/23  3:25 AM  Result Value Ref Range   Order Confirmation      ORDER PROCESSED BY BLOOD BANK Performed at [facility], [address], [place], [place] [address]   CBC     Status: Abnormal   Collection Time: 10/14/23  3:25 AM  Result Value Ref Range   WBC 10.5 4.0 - 10.5 K/uL   RBC 2.29 (L) 4.22 - 5.81 MIL/uL   Hemoglobin 6.3 (LL) 13.0 - 17.0 g/dL    Comment: REPEATED TO VERIFY THIS CRITICAL RESULT HAS VERIFIED AND BEEN CALLED TO [facility]  RN BY ROIDER SATRAIN ON 11 21 2024 AT 0349, AND HAS BEEN READ BACK.     HCT 21.2 (L)  39.0 - 52.0 %   MCV 92.6 80.0 - 100.0 fL   MCH 27.5 26.0 - 34.0 pg   MCHC 29.7 (L) 30.0 - 36.0 g/dL   RDW 93.8 (H) 18.2 - 99.3 %   Platelets 158 150 - 400 K/uL   nRBC 0.0 0.0 - 0.2 %    Comment: Performed at [facility], [address], [place], [place] [address]  Glucose, capillary     Status: Abnormal   Collection Time: 10/14/23  3:43 AM  Result Value Ref Range   Glucose-Capillary 108 (H) 70 - 99 mg/dL    Comment: Glucose reference range applies only to samples taken after fasting for at least 8 hours.  Glucose, capillary     Status: Abnormal   Collection Time: 10/14/23  7:54 AM  Result Value Ref Range   Glucose-Capillary 103 (H) 70 - 99 mg/dL    Comment: Glucose reference range applies only to samples taken after fasting for at least 8 hours.    CT HEAD WO CONTRAST ( )  Result Date: 10/14/2023 CLINICAL DATA:  Ataxia, head trauma Frequent falls, head trauma 1 week ago EXAM: CT HEAD WITHOUT CONTRAST TECHNIQUE: Contiguous axial images were obtained from the base of the skull through the vertex without intravenous contrast. RADIATION DOSE REDUCTION: This exam was performed according to the departmental dose-optimization program which includes automated exposure control, adjustment of the mA and/or kV according to patient size and/or use of iterative reconstruction technique. COMPARISON:  None Available. FINDINGS: Limitations: Motion degraded exam Brain: No hemorrhage. No hydrocephalus. No extra-axial fluid collection. No CT evidence of an acute cortical infarct. No mass effect. No mass lesion. Vascular: No hyperdense vessel or unexpected calcification. Skull: Normal. Negative for fracture or focal lesion. Sinuses/Orbits: No middle ear or mastoid effusion. Paranasal sinuses are notable for mild mucosal thickening of the floor of bilateral maxillary sinuses. Orbits are unremarkable. Other: None. IMPRESSION: No CT evidence of intracranial injury. Electronically Signed   By: Lorenza Cambridge  M.D.   On: 10/14/2023 08:17   CT L-SPINE NO CHARGE  Result Date: 10/13/2023 CLINICAL DATA:  Pain after fall. EXAM: CT LUMBAR SPINE WITHOUT CONTRAST TECHNIQUE: Multidetector CT imaging of the lumbar spine was performed without intravenous contrast administration. Multiplanar CT image reconstructions were also generated. RADIATION DOSE REDUCTION: This exam was performed according to the departmental dose-optimization program which includes automated exposure control, adjustment of the mA and/or kV according to patient size and/or use of iterative reconstruction technique. COMPARISON:  None Available. FINDINGS: Segmentation: 5 lumbar type vertebrae. Alignment: Normal. Vertebrae: No evidence of acute fracture. Schmorl's node involving superior endplate of L1 and L2. The posterior elements are intact. Paraspinal and other soft tissues: Assessed on concurrent abdominopelvic CT, reported separately. Disc levels: Mild facet hypertrophy and broad-based disc bulge at multiple levels. IMPRESSION: 1. No acute fracture or subluxation of the lumbar spine. 2. Mild degenerative disc disease and facet hypertrophy. Electronically Signed   By: Narda Rutherford M.D.   On: 10/13/2023 20:23   CT ABDOMEN PELVIS WO CONTRAST  Result Date: 10/13/2023 CLINICAL DATA:  Abdominal pain, fall.  Sacral pain. EXAM: CT ABDOMEN AND PELVIS WITHOUT CONTRAST TECHNIQUE: Multidetector CT imaging of the abdomen and pelvis was performed following the standard protocol without IV contrast. RADIATION DOSE REDUCTION: This exam was performed according to the departmental dose-optimization program which includes automated exposure  control, adjustment of the mA and/or kV according to patient size and/or use of iterative reconstruction technique. COMPARISON:  Noncontrast CT 09/02/2021 FINDINGS: Lower chest: Small left pleural effusion demonstrates simple fluid density. There is also a trace right pleural effusion. Hepatobiliary: Assessment for injury is  limited in the absence of IV contrast. No evidence of focal hepatic abnormality or perihepatic hematoma. Subtle capsular nodularity of the liver. Multiple gallstones without abnormal gallbladder distension. Pancreas: Mild fat stranding about the pancreatic head and body. No ductal dilatation. Spleen: Unremarkable unenhanced appearance. Adrenals/Urinary Tract: Chronic left adrenal thickening. No evidence of adrenal hemorrhage. Bilateral renal parenchymal atrophy. Symmetric perinephric fat stranding. No renal calculi. Unremarkable urinary bladder. Stomach/Bowel: Moderate gastric distention with fluid. Mild wall thickening about the duodenum adjacent to pancreatic stranding. No small bowel obstruction or obvious small bowel wall thickening. Colonic diverticulosis without diverticulitis. Normal appendix. Vascular/Lymphatic: Aortic atherosclerosis. No periaortic stranding or retroperitoneal fluid to suggest injury. No abdominopelvic adenopathy. Reproductive: Unremarkable. Other: Generalized body wall and subcutaneous edema. Mild edema of the mesenteric fat, more prominent taste in the pancreas. Small fat containing inguinal hernias. Musculoskeletal: Comminuted intertrochanteric right proximal femur fracture with mild displacement. No additional fracture of the pelvis. No sacral fracture. Lumbar spine assessed on concurrent lumbar spine reformats, reported separately. No fracture of included ribs. IMPRESSION: 1. Comminuted intertrochanteric right proximal femur fracture with mild displacement. 2. No additional traumatic injury in the abdomen/pelvis. 3. Mild fat stranding about the pancreatic head and body. There is also generalized edema of the mesenteric and subcutaneous fat. Recommend correlation with pancreatic enzymes for pancreatitis. 4. Cholelithiasis. Colonic diverticulosis without diverticulitis. 5. Small left and trace right pleural effusions. 6. Subtle capsular nodularity of the liver can be seen with cirrhosis.  Recommend correlation for cirrhosis risk factors. Aortic Atherosclerosis (ICD10-I70.0). Electronically Signed   By: Narda Rutherford M.D.   On: 10/13/2023 19:58   DG Femur Min 2 Views Right  Result Date: 10/13/2023 CLINICAL DATA:  Status post fall with right leg pain. EXAM: RIGHT FEMUR 2 VIEWS; PELVIS - 1-2 VIEW COMPARISON:  None Available. FINDINGS: Femur: Displaced intertrochanteric femur fracture. Distal femur is intact. Knee alignment is maintained. Pelvis: No additional fracture of the pelvis. No hip dislocation. Mild bilateral hip osteoarthritis. No pubic symphyseal or sacroiliac diastasis. IMPRESSION: Displaced intertrochanteric femur fracture. Electronically Signed   By: Narda Rutherford M.D.   On: 10/13/2023 19:20   DG Pelvis 1-2 Views  Result Date: 10/13/2023 CLINICAL DATA:  Status post fall with right leg pain. EXAM: RIGHT FEMUR 2 VIEWS; PELVIS - 1-2 VIEW COMPARISON:  None Available. FINDINGS: Femur: Displaced intertrochanteric femur fracture. Distal femur is intact. Knee alignment is maintained. Pelvis: No additional fracture of the pelvis. No hip dislocation. Mild bilateral hip osteoarthritis. No pubic symphyseal or sacroiliac diastasis. IMPRESSION: Displaced intertrochanteric femur fracture. Electronically Signed   By: Narda Rutherford M.D.   On: 10/13/2023 19:20   DG Wrist Complete Left  Result Date: 10/12/2023 CLINICAL DATA:  rule out scaphoid fx; add scaphoid view EXAM: LEFT WRIST - COMPLETE 3+ VIEW COMPARISON:  None Available. FINDINGS: Normal alignment. No acute fracture. The soft tissues are unremarkable. IMPRESSION: No acute osseous abnormality. Electronically Signed   By: Olive Bass M.D.   On: 10/12/2023 16:36   [place] LIMITED JOINT SPACE STRUCTURES LOW LEFT  Result Date: 10/12/2023 Ultrasound of the left knee INDICATION: Lt knee pain and swelling Findings: Small effusion in the suprapatellar pouch Patellar and quadriceps tendons both with trace hypoechoic intratendinous  changes and some  mild hypoechoic fluid surrounding the tendons Some soft tissue edema of the Hoffa's fat pad Summary: Patella tendinitis with a small effusion. Ultrasound and interpretation by Dr. Webb Silversmith and Dr. Christella Hartigan Limited US of the Left Wrist: Indications: Lt wrist pain s/p fall, r/o possible fx FINDINGS: 1st compartment: visualized in SAX and LAX.  APL and EPB fibers intact with some hypoechoic fluid surrounding the tendon from the distal radius to the wrist joint 2nd compartment: ECRB and ECRL visualized and LAX and SAX.  There is some mild hypoechoic fluid surrounding the tendons at the level of the wrist joint.  All fibers are intact 3rd compartment: EPL with some trace hypoechoic fluid at the distal insertion seen in SAX and LAX 4th compartment: ED and EI visualized and SA X and LAX.  There is hypoechoic fluid changes within the tendon sheath from Lister's tubercle down to the distal insertion points. Scaphoid: Visualized in LAX and SAX there is some hypoechoic fluid overlying the ostium.  No defects of the ostium appreciated. Summary: Findings consistent with tendinosis of the 1-4 compartments.  All fibers are intact.  Worsened inflammation around the extensor digitorum at the level of the wrist joint.  There is some mild edema over the dorsal scaphoid surface as well. Ultrasound and interpretation by Dr. Webb Silversmith and Dr. Christella Hartigan    Review of Systems  HENT:  Negative for ear discharge, ear pain, hearing loss and tinnitus.   Eyes:  Negative for photophobia and pain.  Respiratory:  Negative for cough and shortness of breath.   Cardiovascular:  Negative for chest pain.  Gastrointestinal:  Negative for abdominal pain, nausea and vomiting.  Genitourinary:  Negative for dysuria, flank pain, frequency and urgency.  Musculoskeletal:  Positive for arthralgias (Right hip). Negative for back pain, myalgias and neck pain.  Neurological:  Negative for dizziness and headaches.  Hematological:  Does not  bruise/bleed easily.  Psychiatric/Behavioral:  The patient is not nervous/anxious.    Blood pressure (!) 148/77, pulse 96, temperature 97.8 F (36.6 C), temperature source Oral, resp. rate 16, height 6\' 5"  (1.956 m), weight 117 kg, SpO2 95%. Physical Exam Constitutional:      General: He is not in acute distress.    Appearance: He is well-developed. He is not diaphoretic.  HENT:     Head: Normocephalic and atraumatic.  Eyes:     General: No scleral icterus.       Right eye: No discharge.        Left eye: No discharge.     Conjunctiva/sclera: Conjunctivae normal.  Cardiovascular:     Rate and Rhythm: Normal rate and regular rhythm.  Pulmonary:     Effort: Pulmonary effort is normal. No respiratory distress.  Musculoskeletal:     Cervical back: Normal range of motion.     Comments: RLE No traumatic wounds, ecchymosis, or rash  Mod TTP hip, s/p BKA  No knee effusion  Knee stable to varus/ valgus and anterior/posterior stress  Skin:    General: Skin is warm and dry.  Neurological:     Mental Status: He is alert.  Psychiatric:        Mood and Affect: Mood normal.        Behavior: Behavior normal.     Assessment/Plan: Right hip fx -- Plan IMN tomorrow with Dr. Jena Gauss. Please keep NPO after MN. Multiple medical problems including AKI on CKD, acute on chronic anemia, HTN, and DM -- per primary service    Freeman Caldron, PA-C Orthopedic Surgery  [phone] 10/14/2023, 9:04 AM
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
## 2023-10-14 NOTE — Assessment & Plan Note (Addendum)
Denies any abdominal pain and without TTP this morning.  CT AP with mild fat stranding about the pancreatic head and body, but lipase WNL, will continue to monitor for evidence of pancreatitis.  Does have gallstone on CT.  FOBT negative. Monitor for improvement after surgery Consider further workup should he develop fever, uptrending WBC, abdominal pain without palpation

## 2023-10-14 NOTE — Assessment & Plan Note (Addendum)
Falls often, now with increasing frequency. Was seen for a fall as recently as last week did have head trauma with that event.  Compounded by the fact that he is s/p BKA on the right, he also appears deconditioned. Had been getting PT as he adapted to his new prosthesis, but this ended on 05/28/23.  Patient does not endorse any presyncope, syncope, or palpitations to suggest arrhythmia. I have a high degree of suspicion uremia may be a primary driver here.  CT Head is pending given frequent falls and head trauma last week  Monitor for uremia Follow-up EKG Consult PT/OT following surgery

## 2023-10-14 NOTE — Assessment & Plan Note (Deleted)
Holding [employer] long-acting insulin, restart as indicated CBGs, SSI very sensitive

## 2023-10-14 NOTE — Assessment & Plan Note (Addendum)
Secondary to mechanical fall.  No evidence of compartment syndrome per orthopedic surgical eval, surgical fixation tomorrow.  Vitamin D 10.22, contributing to fracture risk.  On calcitriol 0.5 mcg BID. NPO at midnight tomorrow for surgical fixation Pain regimen: Increase hydromorphone frequency to 1 mg Q2h PRN Consider vitamin D 09811 IU supplementation for 8 weeks
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
## 2023-10-14 NOTE — Plan of Care (Signed)

## 2023-10-14 NOTE — Assessment & Plan Note (Signed)
CKD 5 at baseline. Secondary to diabetes and hypertension.  Cr 9.53 and K 4.9 on admission. Follows with nephrology outpatient and they have been preparing him that he is nearing the point of needing HD.   Refer to [facility] recommended graft but has not been placed due to patient deferral.  Has been denied transplant in past.  Cr is now significantly worse than it has been, though he tells [place] he is urinating as much as usual.  Last nephrology outpatient note reports concern for mild uremia. I am concerned he may need initiation of HD sooner rather than later given high suspicion for uremia contributing to his frequent falls. History of urinary retention, must consider possibility of retention contributing given report of no UOP x12 hours.  Consult nephrology in morning, follows with Dr. Malen Gauze  Daily renal function panel Continue calcitriol and Renvela (increased to 800 3 times daily recently) Holding [employer] Avoid nephrotoxic agents Strict I's and O's, monitor for urinary output Bladder scan Holding off on further IV fluid until we ensure he is not becoming anuric, high risk of volume overload

## 2023-10-14 NOTE — Assessment & Plan Note (Addendum)
CKD V at baseline in setting of T2DM and HTN.  Nephrology to see patient this AM and evaluate for HD.  Refer to [facility] recommended graft but has not been placed due to patient deferral.  Has been denied transplant in past.  Creatinine at known peak, UOP still normal though retaining urine.  Suspect uremia. Continue calcitriol and Renvela (increased to 800 TID recently) [employer] for now Avoid nephrotoxic agents Strict I&Os RFP daily Follow up Nephrology recs, appreciate assistance

## 2023-10-14 NOTE — Assessment & Plan Note (Signed)
A1c 6.1%. Holding [employer] long-acting insulin, restart as indicated CBGs, SSI very sensitive

## 2023-10-14 NOTE — Assessment & Plan Note (Addendum)
Secondary to mechanical fall today.  Orthopedic surgery, Dr. Linna Caprice, consulted by EDP.  Patient is n.p.o. ahead of potential procedure. Will need to investigate his frequent falls as part of this hospitalization. See below.  Admitted to 5N, med-surg, inpatient status  Continue n.p.o. Follow-up orthopedic surgery recommendations Hydromorphone 0.5 for moderate pain / 1 mg for severe pain every 4 hours as needed Vit D level with am labs

## 2023-10-14 NOTE — Assessment & Plan Note (Addendum)
Acute on chronic.  Iron studies support more anemia of chronic disease picture than IDA give ferritin 39.  However, Hgb continuing to trend down, was 9.3 just one week ago, which is concerning for acute blood loss.  Baseline Hgb ~10-11.  Receiving 2 units PRBC this AM prior to surgery.  Gets retacrit through nephrology, has not tolerated PO and IV iron supplementation.  Suspect hemoglobin decreased overnight is appropriate in the setting of acute fracture.  No evidence of compartment syndrome on exam. CT leg/pelvis if continuing to drop Hgb or increase following transfusion is significantly less than expected Follow-up post transfusion H&H Follow-up nephrology recommendations

## 2023-10-14 NOTE — Progress Notes (Addendum)
2355 Pt refused to turn for proper skin assessment. 0000 KEEP NPO Started as prescribed for OR today.  Pt informed; understanding verbalized. 1610 Contacted CT-dept again. Personnel states they are sending for pt now.

## 2023-10-14 NOTE — Consult Note (Addendum)
Hospital Consult    Reason for Consult:  in need of HD access Requesting Physician:  Juel Burrow MRN #:  [id number]  History of Present Illness: This is a 52 y.o. male who presented to the hospital with a femur fx.  He has hx of CKD5 that is followed by Dr. Valentino Nose. He is now uremic and nephrology is requesting access.    Pt was seen in our office in December 2023 for HD access.  He was not comfortable pursuing surgery and wanted time to think about it.    He is seen in his room today.  He doesn't really have much to say except he does not want a catheter in his chest and wants a fistula.  He is right handed.    The pt is on a statin for cholesterol management.  The pt is not on a daily aspirin.   Other AC:  none The pt is on CCB, BB for hypertension.   The pt is  on medication for diabetes PTA. Tobacco hx:  current  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)    Depression    Diabetes mellitus without complication (HCC) 2019   GERD (gastroesophageal reflux disease)    Hyperlipidemia    Hypertension    Necrotizing fasciitis (HCC) 10/13/2017   Neuromuscular disorder (HCC)    neuropathy   PAD (peripheral artery disease) (HCC)    Secondary hyperparathyroidism (HCC)    Wound dehiscence 11/24/2017    Past Surgical History:  Procedure Laterality Date   AMPUTATION Right 10/13/2017   Procedure: RIGHT BELOW KNEE AMPUTATION;  Surgeon: Nadara Mustard, MD;  Location: [facility] OR;  Service: Orthopedics;  Laterality: Right;   AMPUTATION Right 11/24/2017   Procedure: AMPUTATION BELOW KNEE REVISION;  Surgeon: Nadara Mustard, MD;  Location[facility];  Service: Orthopedics;  Laterality: Right;   AMPUTATION TOE Left    BIOPSY  09/02/2021   Procedure: BIOPSY;  Surgeon: Imogene Burn, MD;  Location: [facility] ENDOSCOPY;  Service:  Gastroenterology;;   BIOPSY  02/18/2023   Procedure: BIOPSY;  Surgeon: Benancio Deeds, MD;  Location: [facility] ENDOSCOPY;  Service: Gastroenterology;;   COLONOSCOPY  05/11/2019   ESOPHAGOGASTRODUODENOSCOPY (EGD) WITH PROPOFOL N/A 09/02/2021   Procedure: ESOPHAGOGASTRODUODENOSCOPY (EGD) WITH PROPOFOL;  Surgeon: Imogene Burn, MD;  Location: [facility] ENDOSCOPY;  Service: Gastroenterology;  Laterality: N/A;   ESOPHAGOGASTRODUODENOSCOPY (EGD) WITH PROPOFOL N/A 02/18/2023   Procedure: ESOPHAGOGASTRODUODENOSCOPY (EGD) WITH PROPOFOL;  Surgeon: Benancio Deeds, MD;  Location: [facility] ENDOSCOPY;  Service: Gastroenterology;  Laterality: N/A;   NO PAST SURGERIES     POLYPECTOMY     WISDOM TOOTH EXTRACTION      Allergies  Allergen Reactions   Iron Nausea And Vomiting    With the iron tablets   Tape Rash    Rash at site of tape--okay to use paper tape    Prior to Admission medications   Medication Sig Start Date End Date Taking? Authorizing Provider  amLODipine (NORVASC) 5 MG tablet TAKE 1 TABLET(5 MG) BY MOUTH AT BEDTIME Patient taking differently: Take 5 mg by mouth daily. 10/12/23  Yes [name], MD  atorvastatin (LIPITOR) 40 MG tablet Take 1 tablet (40 mg total) by mouth at bedtime. Patient taking differently: Take 40 mg by mouth daily. 12/14/22  Yes  [name], MD  baclofen (LIORESAL) 10 MG tablet TAKE 1/2 TABLET BY MOUTH TWICE DAILY Patient taking differently: Take 10 mg by mouth daily. 10/04/23  Yes [name], MD  calcitRIOL (ROCALTROL) 0.5 MCG capsule Take 0.5 mcg by mouth in the morning and at bedtime. 09/08/22  Yes [provider]  carvedilol (COREG) 12.5 MG tablet Take 12.5 mg by mouth 2 (two) times daily with a meal.   Yes [provider]  diltiazem (CARDIZEM CD) 300 MG 24 hr capsule TAKE 1 CAPSULE(300 MG) BY MOUTH AT BEDTIME 10/21/22  Yes Ursuy, Renee Lynn, PA-C  FARXIGA 10 MG TABS tablet TAKE 1 TABLET BY MOUTH EVERY DAY 09/15/23  Yes [name],  MD  FLUoxetine (PROZAC) 10 MG capsule TAKE 1 CAPSULE(10 MG) BY MOUTH DAILY Patient taking differently: Take 10 mg by mouth daily. 10/04/23  Yes [name], MD  furosemide (LASIX) 40 MG tablet Take 40 mg by mouth daily.   Yes [provider]  gabapentin (NEURONTIN) 300 MG capsule TAKE 1 CAPSULE(300 MG) BY MOUTH DAILY 05/26/23  Yes [name], MD  HUMALOG KWIKPEN 100 UNIT/ML KwikPen Inject 6 Units into the skin 2 (two) times daily before lunch and supper. Dose per sliding scale 02/25/23  Yes McDiarmid, Leighton Roach, MD  hydrALAZINE (APRESOLINE) 50 MG tablet TAKE 1 AND 1/2 TABLETS(75 MG) BY MOUTH THREE TIMES DAILY Patient taking differently: Take 100 mg by mouth in the morning and at bedtime. 07/06/23  Yes Alicia Amel, MD  Insulin Disposable Pump (OMNIPOD DASH PODS, GEN 4,) MISC  04/15/22  Yes [provider]  metoprolol succinate (TOPROL XL) 50 MG 24 hr tablet TAKE 100 MG IN THE AM AND TAKE 50 MG IN THE PM Patient taking differently: Take 50 mg by mouth in the morning and at bedtime. 07/19/23  Yes Sheilah Pigeon, PA-C  ondansetron (ZOFRAN-ODT) 8 MG disintegrating tablet DISSOLVE 1 TABLET(8 MG) ON THE TONGUE EVERY 8 HOURS AS NEEDED FOR NAUSEA OR VOMITING 08/12/23  Yes [name], MD  sevelamer carbonate (RENVELA) 800 MG tablet Take 2,400 mg by mouth in the morning and at bedtime.   Yes [provider]  Lawrence Marseilles LANCETS MISC Check blood sugars before meals twice daily 11/19/17   Julieanne Manson, MD  Blood Glucose Monitoring Suppl (ACCU-CHEK GUIDE ME) w/Device KIT See admin instructions. 02/23/23   [provider]  glucose blood (AGAMATRIX PRESTO TEST) test strip Check blood sugars twice daily before meals 02/02/18   Julieanne Manson, MD  glucose blood test strip 1 each by Other route as needed for other. Use as instructed    [provider]    Social History   Socioeconomic History   Marital status: Divorced    Spouse name:  Not on file   Number of children: 1   Years of education: 26   Highest education level: 12th grade  Occupational History   Occupation: unemployed  Tobacco Use   Smoking status: Every Day    Current packs/day: 0.50    Average packs/day: 0.5 packs/day for 35.4 years (17.7 ttl pk-yrs)    Types: Cigarettes    Start date: 11/23/1988   Smokeless tobacco: Never   Tobacco comments:    Uninterested - 0/10  importance of quitting 02/2023  Vaping Use   Vaping status: Never Used  Substance and Sexual Activity   Alcohol use: No   Drug use: No   Sexual activity: Not Currently  Other Topics Concern   Not on file  Social  History Narrative   Originally from  [place].   Is living with his mother, who essentially supports him now.   Social Determinants of Health   Financial Resource Strain: High Risk (08/26/2023)   Overall Financial Resource Strain (CARDIA)    Difficulty of Paying Living Expenses: Hard  Food Insecurity: No Food Insecurity (10/14/2023)   Hunger Vital Sign    Worried About Running Out of Food in the Last Year: Never true    Ran Out of Food in the Last Year: Never true  Transportation Needs: No Transportation Needs (10/14/2023)   PRAPARE - Administrator, Civil Service (Medical): No    Lack of Transportation (Non-Medical): No  Physical Activity: Inactive (06/08/2023)   Exercise Vital Sign    Days of Exercise per Week: 0 days    Minutes of Exercise per Session: 0 min  Stress: Stress Concern Present (08/26/2023)   [employer] of Occupational Health - Occupational Stress Questionnaire    Feeling of Stress : Very much  Social Connections: Socially Isolated (06/08/2023)   Social Connection and Isolation Panel [NHANES]    Frequency of Communication with Friends and Family: Once a week    Frequency of Social Gatherings with Friends and Family: Never    Attends Religious Services: Never    Database administrator or Organizations: No    Attends Banker  Meetings: Never    Marital Status: Divorced  Catering manager Violence: Not At Risk (10/14/2023)   Humiliation, Afraid, Rape, and Kick questionnaire    Fear of Current or Ex-Partner: No    Emotionally Abused: No    Physically Abused: No    Sexually Abused: No     Family History  Problem Relation Age of Onset   Diabetes Mellitus II Mother    Depression Mother    Colon polyps Mother    Hypertension Mother    High Cholesterol Mother    Heart disease Father        CABG x 4.  04/2017   Diabetes Father    High Cholesterol Father    Hypertension Father    Heart disease Maternal Grandmother    Heart disease Maternal Grandfather    Colon cancer Neg Hx    Esophageal cancer Neg Hx    Liver cancer Neg Hx    Pancreatic cancer Neg Hx    Rectal cancer Neg Hx    Stomach cancer Neg Hx     ROS: [x]  Positive   [ ]  Negative   [ ]  All sytems reviewed and are negative Cardiac: []  chest pain/pressure []  hx MI []  SOB   Vascular: []  pain in legs while walking []  pain in legs at rest []  pain in legs at night []  non-healing ulcers []  hx of DVT []  swelling in legs  Pulmonary: []  asthma/wheezing []  home O2  Neurologic: []  hx of CVA []  mini stroke [x]  neuropathy  Hematologic: []  hx of cancer  Endocrine:   [x]  diabetes []  thyroid disease  GI [x]  GERD  GU: [x]  CKD/renal failure []  HD--[]  M/W/F or []  T/T/S  Psychiatric: []  anxiety [x]  depression  Musculoskeletal: []  arthritis []  joint pain  Integumentary: []  rashes []  ulcers  Constitutional: []  fever  []  chills  Physical Examination  Vitals:   10/14/23 0830 10/14/23 1122  BP: (!) 144/64 (!) 144/56  Pulse: 100   Resp: 17   Temp: 98.6 F (37 C)   SpO2:     Body mass index is 30.59 kg/m.  General:  WDWN in NAD Gait: Not observed HENT: WNL, normocephalic Pulmonary: normal non-labored breathing Cardiac: regular Skin: without rashes Vascular Exam/Pulses: 2+ palpable radial pulses bilaterally; 2+ left DP  pulse Extremities: IV right antecubital space Musculoskeletal: no muscle wasting or atrophy  Neurologic: A&O X 3 Psychiatric:  The pt has  flat  affect.   CBC    Component Value Date/Time   WBC 10.5 10/14/2023 0325   RBC 2.29 (L) 10/14/2023 0325   HGB 6.3 (LL) 10/14/2023 0325   HGB 9.8 (L) 05/18/2023 1529   HCT 21.2 (L) 10/14/2023 0325   HCT 31.1 (L) 05/18/2023 1529   PLT 158 10/14/2023 0325   PLT 159 05/18/2023 1529   MCV 92.6 10/14/2023 0325   MCV 91 05/18/2023 1529   MCH 27.5 10/14/2023 0325   MCHC 29.7 (L) 10/14/2023 0325   RDW 16.6 (H) 10/14/2023 0325   RDW 13.9 05/18/2023 1529   LYMPHSABS 1.3 10/13/2023 1948   LYMPHSABS 2.7 01/21/2018 1631   MONOABS 0.8 10/13/2023 1948   EOSABS 0.2 10/13/2023 1948   EOSABS 0.6 (H) 01/21/2018 1631   BASOSABS 0.0 10/13/2023 1948   BASOSABS 0.0 01/21/2018 1631    BMET    Component Value Date/Time   NA 139 10/14/2023 0324   NA 140 05/18/2023 1529   K 4.9 10/14/2023 0324   CL 113 (H) 10/14/2023 0324   CO2 17 (L) 10/14/2023 0324   GLUCOSE 120 (H) 10/14/2023 0324   BUN 76 (H) 10/14/2023 0324   BUN 83 (HH) 05/18/2023 1529   CREATININE 10.16 (H) 10/14/2023 0324   CALCIUM 7.5 (L) 10/14/2023 0324   GFRNONAA 6 (L) 10/14/2023 0324   GFRAA 57 (L) 08/18/2019 1838    COAGS: Lab Results  Component Value Date   INR 1.0 10/13/2023   INR 0.9 09/01/2021   INR 1.0 04/23/2021     Non-Invasive Vascular Imaging:   BUE vein mapping has been ordered    ASSESSMENT/PLAN: This is a 52 y.o. male with CKD 5 and now uremic in need of dialysis access.    -discussed with him that they would be discussing possible catheter placement tomorrow with IR, however, he states he does not want a catheter in his chest.  I discussed with him this is the only way currently to receive HD.   -he had vein mapping at the end of last year that revealed a marginal left basilic vein.  Will repeat vein mapping since it has been almost a year.  Discussed this with  the pt.  Also discussed that access may not mature adequately and may need further procedures or even new access and that it does not last forever.  -Dr. Hetty Blend to evaluate pt and determine further plan   Doreatha Massed, PA-C [facility] [phone]  VASCULAR STAFF ADDENDUM: I have independently interviewed and examined the patient. I agree with the above.  Plan to repeat vein mapping.  Explained that regardless of AVF or AVG that he would need a TDC for HD for at least a few months while the permanent access heals. Please reach out to IR for line placement should he need HD by this weekend. We will plan for permanent access creation early next week.  Daria Pastures MD [facility] Phone Number: [phone] 10/14/2023 5:13 PM

## 2023-10-14 NOTE — Assessment & Plan Note (Signed)
Acute on chronic. Baseline hgb appears to be in the 10-11 range. Gets retacrit through nephrology. Has been intolerant of both PO and IV iron supplementation. Hgb was 7.0 on admission, down from 9.3 just one week ago. Suspect there may be an acute blood loss component given his acute intertrochanteric femur fracture. Will need to monitor this closely. No obvious evidence of blood loss elsewhere, hemoccult negative in the ED.  Type and screen pending Repeat Hgb pending Have a call out to ortho to discuss benefit of proactive blood transfusion prior to surgery  Iron studies added to am labs

## 2023-10-14 NOTE — TOC CAGE-AID Note (Signed)
Transition of Care [facility]) - CAGE-AID Screening   Patient Details  Name: KIARA DUEL MRN: [id number] Date of Birth: December 25, 1970  [facility] CM/SW Contact:    Katha Hamming, RN Phone Number: 10/14/2023, 8:40 PM   Clinical Narrative:  Chart review with no alcohol or drug use hx  CAGE-AID Screening:    Have You Ever Felt You Ought to Cut Down on Your Drinking or Drug Use?: No Have People Annoyed You By Critizing Your Drinking Or Drug Use?: No Have You Felt Bad Or Guilty About Your Drinking Or Drug Use?: No Have You Ever Had a Drink or Used Drugs First Thing In The Morning to Steady Your Nerves or to Get Rid of a Hangover?: No CAGE-AID Score: 0  Substance Abuse Education Offered: No

## 2023-10-14 NOTE — Consult Note (Addendum)
Reason for Consult: Renal failure Referring Physician:  Dr. 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 Cr was in the 8's on 09/22/2023 at [facility] clinic, followed by Dr. Malen Gauze and last seen by Dr. Valentino Nose. - I spent a lot of time educating the patient about his need to start HD; appears to have been a miscommunication. He insisted on going to [place] for his access placement mainly because he thought he would have to come to the hospital at [facility] for access placement. He's willing to have surgery here at [facility]. Consulted [facility] for permanent access   Will ask [facility] to place TC tomorrow to initiate dialysis.  Likely won't start dialysis till Saturday given no access currently and still requires surgery for the femur fracture. He is right arm dominant.  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  Anemia - already on ESA's; no need to check iron panel (TSAT 41% with Ferritin 44 on 10/30). Will also load with IV iron as Ferritin only 44. Has had nausea with oral iron but would like challenge IV iron. HTN - restart home regimen. Obstructive uropathy -  condom cath currently; PVR bladder scan only 4mL and no symptoms either. Intertrochanteric femur fracture - seen by ortho. Pain 10/10 currently.    HPI: [name] [name] is an 52 y.o. male with a history of DM 2, HTN, urinary retention, PAD (status post right BKA), HLD CKD5 with a creatinine in the 8's on 09/22/2023 followed by Dr. Malen Gauze but now followed by Dr. Valentino Nose. Renal disease thought to from diabetic kidney disease related to poorly controlled diabetes in the past and he has been nearing the need for dialysis. Appears that he was denied a preemptive transplant at [facility]. Patient was  also seen by Dr. Malen Gauze in September and felt to have uremic symptoms and she recommended a AVG but the patient asked to be referred to a vascular surgeon in [place] to look into a fistula. Patient does not appear to have seen any surgeon yet and wanted to wait before initiating dialysis. He has anorexia which is almost certainly 2/2 uremia.  He is not presenting with a IT proximal fracture of the femur following a ground level fall. He denies any LOC but has a BKA from PAD. Patient denies dizziness or the room spinning but appears to be deconditioned as well. He  had pain in the right hip and was unable to stand or bear weight with hip plain films revealing the fracture.    No fevers, chills, sob, cp, n/v/d, dysuria, hematuria. No dizziness or lightheadedness.  ROS Pertinent items are noted in HPI.  Chemistry and CBC: Creatinine, Ser  Date/Time Value Ref Range Status  10/14/2023 03:24 AM 10.16 (H) 0.61 - 1.24 mg/dL Final  16/08/9603 54:09 PM 9.53 (H) 0.61 - 1.24 mg/dL Final  81/19/1478 29:56 PM 5.51 (H) 0.76 - 1.27 mg/dL Final  21/30/8657 84:69 PM 4.73 (H) 0.76 - 1.27 mg/dL Final  62/95/2841 32:44 AM 4.45 (H) 0.61 - 1.24 mg/dL Final  11/25/7251 66:44 PM 5.60 (H) 0.61 - 1.24 mg/dL Final  03/47/4259 56:38 PM 4.79 (H) 0.61 - 1.24 mg/dL Final  75/64/3329 51:88 PM 7.20 (H) 0.61 - 1.24 mg/dL Final  41/66/0630 16:01 PM 6.11 (H) 0.61 - 1.24 mg/dL Final  09/32/3557  04:44 AM 5.52 (H) 0.61 - 1.24 mg/dL Final  40/98/1191 47:82 PM 5.99 (H) 0.61 - 1.24 mg/dL Final  95/62/1308 65:78 AM 4.59 (H) 0.61 - 1.24 mg/dL Final  46/96/2952 84:13 PM 5.33 (H) 0.61 - 1.24 mg/dL Final  24/40/1027 25:36 PM 2.88 (H) 0.61 - 1.24 mg/dL Final  64/40/3474 25:95 AM 3.68 (H) 0.61 - 1.24 mg/dL Final  63/87/5643 32:95 PM 3.61 (H) 0.61 - 1.24 mg/dL Final  18/84/1660 63:01 PM 2.25 (H) 0.61 - 1.24 mg/dL Final  60/08/9322 55:73 AM 1.83 (H) 0.61 - 1.24 mg/dL Final  22/12/5425 06:23 AM 1.92 (H) 0.61 - 1.24 mg/dL Final   76/28/3151 76:16 AM 2.05 (H) 0.61 - 1.24 mg/dL Final  07/37/1062 69:48 PM 2.19 (H) 0.61 - 1.24 mg/dL Final  54/62/7035 00:93 PM 1.65 (H) 0.61 - 1.24 mg/dL Final  81/82/9937 16:96 PM 0.92 0.76 - 1.27 mg/dL Final  78/93/8101 75:10 AM 1.05 0.61 - 1.24 mg/dL Final  25/85/2778 24:23 AM 1.15 0.61 - 1.24 mg/dL Final  53/61/4431 54:00 AM 1.33 (H) 0.61 - 1.24 mg/dL Final  86/76/1950 93:26 AM 1.13 0.61 - 1.24 mg/dL Final  71/24/5809 98:33 PM 0.98 0.61 - 1.24 mg/dL Final  82/50/5397 67:34 AM 0.93 0.61 - 1.24 mg/dL Final  19/37/9024 09:73 AM 1.00 0.61 - 1.24 mg/dL Final  53/29/9242 68:34 AM 0.97 0.61 - 1.24 mg/dL Final  19/62/2297 98:92 AM 0.89 0.61 - 1.24 mg/dL Final  11/94/1740 81:44 AM 1.06 0.61 - 1.24 mg/dL Final  81/85/6314 97:02 AM 0.91 0.61 - 1.24 mg/dL Final  63/78/5885 02:77 AM 1.04 0.61 - 1.24 mg/dL Final  41/28/7867 67:20 PM 1.23 0.61 - 1.24 mg/dL Final  94/70/9628 36:62 PM 1.18 0.61 - 1.24 mg/dL Final  94/76/5465 03:54 AM 1.19 0.61 - 1.24 mg/dL Final  65/68/1275 17:00 AM 1.34 (H) 0.61 - 1.24 mg/dL Final  17/49/4496 75:91 PM 1.44 (H) 0.61 - 1.24 mg/dL Final  63/84/6659 93:57 PM 1.66 (H) 0.61 - 1.24 mg/dL Final   Recent Labs  Lab 10/13/23 1948 10/14/23 0324  NA 140 139  K 4.9 4.9  CL 114* 113*  CO2 17* 17*  GLUCOSE 216* 120*  BUN 78* 76*  CREATININE 9.53* 10.16*  CALCIUM 7.8* 7.5*  PHOS  --  6.3*   Recent Labs  Lab 10/13/23 1948 10/14/23 0325  WBC 14.3* 10.5  NEUTROABS 11.7*  --   HGB 7.0* 6.3*  HCT 23.4* 21.2*  MCV 93.6 92.6  PLT 188 158   Liver Function Tests: Recent Labs  Lab 10/14/23 0324 10/14/23 0325  AST  --  8*  ALT  --  10  ALKPHOS  --  50  BILITOT  --  0.6  PROT  --  5.2*  ALBUMIN 2.4* 2.3*   Recent Labs  Lab 10/13/23 2008  LIPASE 50   No results for input(s): "AMMONIA" in the last 168 hours. Cardiac Enzymes: No results for input(s): "CKTOTAL", "CKMB", "CKMBINDEX", "TROPONINI" in the last 168 hours. Iron Studies:  Recent Labs     10/14/23 0325  IRON 19*  TIBC 225*  FERRITIN 39   PT/INR: @LABRCNTIP (inr:5)  Xrays/Other Studies: ) Results for orders placed or performed during the hospital encounter of 10/13/23 (from the past 48 hour(s))  Basic metabolic panel     Status: Abnormal   Collection Time: 10/13/23  7:48 PM  Result Value Ref Range   Sodium 140 135 - 145 mmol/L   Potassium 4.9 3.5 - 5.1 mmol/L   Chloride 114 (H) 98 - 111 mmol/L  CO2 17 (L) 22 - 32 mmol/L   Glucose, Bld 216 (H) 70 - 99 mg/dL    Comment: Glucose reference range applies only to samples taken after fasting for at least 8 hours.   BUN 78 (H) 6 - 20 mg/dL   Creatinine, Ser 4.03 (H) 0.61 - 1.24 mg/dL   Calcium 7.8 (L) 8.9 - 10.3 mg/dL   GFR, Estimated 6 (L) >60 mL/min    Comment: (NOTE) Calculated using the CKD-EPI Creatinine Equation (2021)    Anion gap 9 5 - 15    Comment: Performed at [facility], [address], [place], [place] [address]  CBC with Differential     Status: Abnormal   Collection Time: 10/13/23  7:48 PM  Result Value Ref Range   WBC 14.3 (H) 4.0 - 10.5 K/uL   RBC 2.50 (L) 4.22 - 5.81 MIL/uL   Hemoglobin 7.0 (L) 13.0 - 17.0 g/dL   HCT 95.6 (L) 38.7 - 56.4 %   MCV 93.6 80.0 - 100.0 fL   MCH 28.0 26.0 - 34.0 pg   MCHC 29.9 (L) 30.0 - 36.0 g/dL   RDW 33.2 (H) 95.1 - 88.4 %   Platelets 188 150 - 400 K/uL   nRBC 0.0 0.0 - 0.2 %   Neutrophils Relative % 82 %   Neutro Abs 11.7 (H) 1.7 - 7.7 K/uL   Lymphocytes Relative 9 %   Lymphs Abs 1.3 0.7 - 4.0 K/uL   Monocytes Relative 6 %   Monocytes Absolute 0.8 0.1 - 1.0 K/uL   Eosinophils Relative 1 %   Eosinophils Absolute 0.2 0.0 - 0.5 K/uL   Basophils Relative 0 %   Basophils Absolute 0.0 0.0 - 0.1 K/uL   Immature Granulocytes 2 %   Abs Immature Granulocytes 0.29 (H) 0.00 - 0.07 K/uL    Comment: Performed at [facility], [address] [place], [place] [address]  Brain natriuretic peptide     Status: Abnormal   Collection Time: 10/13/23  7:48 PM   Result Value Ref Range   B Natriuretic Peptide 279.3 (H) 0.0 - 100.0 pg/mL    Comment: Performed at [facility], [address], [place], [place] [address]  Protime-INR     Status: None   Collection Time: 10/13/23  8:08 PM  Result Value Ref Range   Prothrombin Time 13.4 11.4 - 15.2 seconds   INR 1.0 0.8 - 1.2    Comment: (NOTE) INR goal varies based on device and disease states. Performed at [facility], [address], [place], [place] [address]   Lipase, blood     Status: None   Collection Time: 10/13/23  8:08 PM  Result Value Ref Range   Lipase 50 11 - 51 U/L    Comment: Performed at [facility], [address] [place], [place] [address]  Urinalysis, Routine w reflex microscopic -Urine, Clean Catch     Status: Abnormal   Collection Time: 10/13/23 10:50 PM  Result Value Ref Range   Color, Urine STRAW (A) YELLOW   APPearance CLEAR CLEAR   Specific Gravity, Urine 1.010 1.005 - 1.030   pH 5.0 5.0 - 8.0   Glucose, UA >=500 (A) NEGATIVE mg/dL   Hgb urine dipstick NEGATIVE NEGATIVE   Bilirubin Urine NEGATIVE NEGATIVE   Ketones, ur NEGATIVE NEGATIVE mg/dL   Protein, ur >=220 (A) NEGATIVE mg/dL   Nitrite NEGATIVE NEGATIVE   Leukocytes,Ua NEGATIVE NEGATIVE   RBC / HPF  0-5 0 - 5 RBC/hpf   WBC, UA 0-5 0 - 5 WBC/hpf   Bacteria, UA RARE (A) NONE SEEN   Squamous Epithelial / HPF 0-5 0 - 5 /HPF    Comment: Performed at [facility], [address] [place], [place] [address]  HIV Antibody (routine testing w rflx)     Status: None   Collection Time: 10/13/23 10:50 PM  Result Value Ref Range   HIV Screen 4th Generation wRfx Non Reactive Non Reactive    Comment: Performed at [facility], [address], [place], [place] [address]  Hemoglobin A1c     Status: Abnormal   Collection Time: 10/13/23 10:50 PM  Result Value Ref Range   Hgb A1c MFr Bld 6.1 (H) 4.8 - 5.6 %    Comment: (NOTE) Pre diabetes:          5.7%-6.4%  Diabetes:               >6.4%  Glycemic control for   <7.0% adults with diabetes    Mean Plasma Glucose 128.37 mg/dL    Comment: Performed at [facility], [address], [place], [place] [address]  POC occult blood, ED Provider will collect     Status: None   Collection Time: 10/13/23 10:54 PM  Result Value Ref Range   Fecal Occult Bld NEGATIVE NEGATIVE  Glucose, capillary     Status: Abnormal   Collection Time: 10/14/23 12:14 AM  Result Value Ref Range   Glucose-Capillary 171 (H) 70 - 99 mg/dL    Comment: Glucose reference range applies only to samples taken after fasting for at least 8 hours.  Type and screen [facility]     Status: None (Preliminary result)   Collection Time: 10/14/23  3:23 AM  Result Value Ref Range   ABO/RH(D) A NEG    Antibody Screen NEG    Sample Expiration 10/17/2023,2359    Unit Number [id number]    Blood Component Type RBC LR PHER2    Unit division 00    Status of Unit ISSUED    Transfusion Status OK TO TRANSFUSE    Crossmatch Result      Compatible Performed at [facility], [address] [place], [place] [address]    Unit Number [id number]    Blood Component Type RED CELLS,LR    Unit division 00    Status of Unit ISSUED    Transfusion Status OK TO TRANSFUSE    Crossmatch Result Compatible   Renal function panel     Status: Abnormal   Collection Time: 10/14/23  3:24 AM  Result Value Ref Range   Sodium 139 135 - 145 mmol/L   Potassium 4.9 3.5 - 5.1 mmol/L   Chloride 113 (H) 98 - 111 mmol/L   CO2 17 (L) 22 - 32 mmol/L   Glucose, Bld 120 (H) 70 - 99 mg/dL    Comment: Glucose reference range applies only to samples taken after fasting for at least 8 hours.   BUN 76 (H) 6 - 20 mg/dL   Creatinine, Ser 51.88 (H) 0.61 - 1.24 mg/dL   Calcium 7.5 (L) 8.9 - 10.3 mg/dL   Phosphorus 6.3 (H) 2.5 - 4.6 mg/dL   Albumin 2.4 (L) 3.5 - 5.0 g/dL   GFR, Estimated 6 (L) >60 mL/min    Comment: (NOTE) Calculated using the CKD-EPI Creatinine  Equation (2021)    Anion gap 9 5 - 15  Comment: Performed at [facility], [address], [place], [place] [address]  Hepatic function panel     Status: Abnormal   Collection Time: 10/14/23  3:25 AM  Result Value Ref Range   Total Protein 5.2 (L) 6.5 - 8.1 g/dL   Albumin 2.3 (L) 3.5 - 5.0 g/dL   AST 8 (L) 15 - 41 U/L   ALT 10 0 - 44 U/L   Alkaline Phosphatase 50 38 - 126 U/L   Total Bilirubin 0.6 <1.2 mg/dL   Bilirubin, Direct <2.1 0.0 - 0.2 mg/dL   Indirect Bilirubin NOT CALCULATED 0.3 - 0.9 mg/dL    Comment: Performed at [facility], [address], [place], [place] [address]  VITAMIN D 25 Hydroxy (Vit-D Deficiency, Fractures)     Status: Abnormal   Collection Time: 10/14/23  3:25 AM  Result Value Ref Range   Vit D, 25-Hydroxy 10.22 (L) 30 - 100 ng/mL    Comment: (NOTE) Vitamin D deficiency has been defined by the Institute of Medicine  and an Endocrine Society practice guideline as a level of serum 25-OH  vitamin D less than 20 ng/mL (1,2). The Endocrine Society went on to  further define vitamin D insufficiency as a level between 21 and 29  ng/mL (2).  1. IOM (Institute of Medicine). 2010. Dietary reference intakes for  calcium and D. [place]: [employer]. 2. Holick MF, Binkley [place], Bischoff-Ferrari HA, et al. Evaluation,  treatment, and prevention of vitamin D deficiency: an Endocrine  Society clinical practice guideline, JCEM. 2011 Jul; 96(7): 1911-30.  Performed at [facility], [address] [place], [place] [address]   Iron and TIBC     Status: Abnormal   Collection Time: 10/14/23  3:25 AM  Result Value Ref Range   Iron 19 (L) 45 - 182 ug/dL   TIBC 629 (L) 528 - 413 ug/dL   Saturation Ratios 8 (L) 17.9 - 39.5 %   UIBC 206 ug/dL    Comment: Performed at [facility], [address] [place], [place] [address]  Ferritin     Status: None   Collection Time: 10/14/23  3:25 AM  Result Value Ref Range   Ferritin 39  24 - 336 ng/mL    Comment: Performed at [facility], [address], [place], [place] [address]  Prepare RBC (crossmatch)     Status: None   Collection Time: 10/14/23  3:25 AM  Result Value Ref Range   Order Confirmation      ORDER PROCESSED BY BLOOD BANK Performed at [facility], [address], [place], [place] [address]   CBC     Status: Abnormal   Collection Time: 10/14/23  3:25 AM  Result Value Ref Range   WBC 10.5 4.0 - 10.5 K/uL   RBC 2.29 (L) 4.22 - 5.81 MIL/uL   Hemoglobin 6.3 (LL) 13.0 - 17.0 g/dL    Comment: REPEATED TO VERIFY THIS CRITICAL RESULT HAS VERIFIED AND BEEN CALLED TO MARIE SERVICE RN BY ROIDER SATRAIN ON 11 21 2024 AT 0349, AND HAS BEEN READ BACK.     HCT 21.2 (L) 39.0 - 52.0 %   MCV 92.6 80.0 - 100.0 fL   MCH 27.5 26.0 - 34.0 pg   MCHC 29.7 (L) 30.0 - 36.0 g/dL   RDW 03.4 (H) 74.2 - 59.5 %   Platelets 158 150 - 400 K/uL   nRBC 0.0 0.0 - 0.2 %  Comment: Performed at [facility], [address], [place], [place] [address]  Glucose, capillary     Status: Abnormal   Collection Time: 10/14/23  3:43 AM  Result Value Ref Range   Glucose-Capillary 108 (H) 70 - 99 mg/dL    Comment: Glucose reference range applies only to samples taken after fasting for at least 8 hours.  Glucose, capillary     Status: Abnormal   Collection Time: 10/14/23  7:54 AM  Result Value Ref Range   Glucose-Capillary 103 (H) 70 - 99 mg/dL    Comment: Glucose reference range applies only to samples taken after fasting for at least 8 hours.  Glucose, capillary     Status: Abnormal   Collection Time: 10/14/23 11:05 AM  Result Value Ref Range   Glucose-Capillary 101 (H) 70 - 99 mg/dL    Comment: Glucose reference range applies only to samples taken after fasting for at least 8 hours.   CT HEAD WO CONTRAST ( )  Result Date: 10/14/2023 CLINICAL DATA:  Ataxia, head trauma Frequent falls, head trauma 1 week ago EXAM: CT HEAD WITHOUT CONTRAST TECHNIQUE: Contiguous  axial images were obtained from the base of the skull through the vertex without intravenous contrast. RADIATION DOSE REDUCTION: This exam was performed according to the departmental dose-optimization program which includes automated exposure control, adjustment of the mA and/or kV according to patient size and/or use of iterative reconstruction technique. COMPARISON:  None Available. FINDINGS: Limitations: Motion degraded exam Brain: No hemorrhage. No hydrocephalus. No extra-axial fluid collection. No CT evidence of an acute cortical infarct. No mass effect. No mass lesion. Vascular: No hyperdense vessel or unexpected calcification. Skull: Normal. Negative for fracture or focal lesion. Sinuses/Orbits: No middle ear or mastoid effusion. Paranasal sinuses are notable for mild mucosal thickening of the floor of bilateral maxillary sinuses. Orbits are unremarkable. Other: None. IMPRESSION: No CT evidence of intracranial injury. Electronically Signed   By: Lorenza Cambridge M.D.   On: 10/14/2023 08:17   CT L-SPINE NO CHARGE  Result Date: 10/13/2023 CLINICAL DATA:  Pain after fall. EXAM: CT LUMBAR SPINE WITHOUT CONTRAST TECHNIQUE: Multidetector CT imaging of the lumbar spine was performed without intravenous contrast administration. Multiplanar CT image reconstructions were also generated. RADIATION DOSE REDUCTION: This exam was performed according to the departmental dose-optimization program which includes automated exposure control, adjustment of the mA and/or kV according to patient size and/or use of iterative reconstruction technique. COMPARISON:  None Available. FINDINGS: Segmentation: 5 lumbar type vertebrae. Alignment: Normal. Vertebrae: No evidence of acute fracture. Schmorl's node involving superior endplate of L1 and L2. The posterior elements are intact. Paraspinal and other soft tissues: Assessed on concurrent abdominopelvic CT, reported separately. Disc levels: Mild facet hypertrophy and broad-based disc  bulge at multiple levels. IMPRESSION: 1. No acute fracture or subluxation of the lumbar spine. 2. Mild degenerative disc disease and facet hypertrophy. Electronically Signed   By: Narda Rutherford M.D.   On: 10/13/2023 20:23   CT ABDOMEN PELVIS WO CONTRAST  Result Date: 10/13/2023 CLINICAL DATA:  Abdominal pain, fall.  Sacral pain. EXAM: CT ABDOMEN AND PELVIS WITHOUT CONTRAST TECHNIQUE: Multidetector CT imaging of the abdomen and pelvis was performed following the standard protocol without IV contrast. RADIATION DOSE REDUCTION: This exam was performed according to the departmental dose-optimization program which includes automated exposure control, adjustment of the mA and/or kV according to patient size and/or use of iterative reconstruction technique. COMPARISON:  Noncontrast CT 09/02/2021 FINDINGS: Lower chest: Small left pleural effusion demonstrates simple  fluid density. There is also a trace right pleural effusion. Hepatobiliary: Assessment for injury is limited in the absence of IV contrast. No evidence of focal hepatic abnormality or perihepatic hematoma. Subtle capsular nodularity of the liver. Multiple gallstones without abnormal gallbladder distension. Pancreas: Mild fat stranding about the pancreatic head and body. No ductal dilatation. Spleen: Unremarkable unenhanced appearance. Adrenals/Urinary Tract: Chronic left adrenal thickening. No evidence of adrenal hemorrhage. Bilateral renal parenchymal atrophy. Symmetric perinephric fat stranding. No renal calculi. Unremarkable urinary bladder. Stomach/Bowel: Moderate gastric distention with fluid. Mild wall thickening about the duodenum adjacent to pancreatic stranding. No small bowel obstruction or obvious small bowel wall thickening. Colonic diverticulosis without diverticulitis. Normal appendix. Vascular/Lymphatic: Aortic atherosclerosis. No periaortic stranding or retroperitoneal fluid to suggest injury. No abdominopelvic adenopathy. Reproductive:  Unremarkable. Other: Generalized body wall and subcutaneous edema. Mild edema of the mesenteric fat, more prominent taste in the pancreas. Small fat containing inguinal hernias. Musculoskeletal: Comminuted intertrochanteric right proximal femur fracture with mild displacement. No additional fracture of the pelvis. No sacral fracture. Lumbar spine assessed on concurrent lumbar spine reformats, reported separately. No fracture of included ribs. IMPRESSION: 1. Comminuted intertrochanteric right proximal femur fracture with mild displacement. 2. No additional traumatic injury in the abdomen/pelvis. 3. Mild fat stranding about the pancreatic head and body. There is also generalized edema of the mesenteric and subcutaneous fat. Recommend correlation with pancreatic enzymes for pancreatitis. 4. Cholelithiasis. Colonic diverticulosis without diverticulitis. 5. Small left and trace right pleural effusions. 6. Subtle capsular nodularity of the liver can be seen with cirrhosis. Recommend correlation for cirrhosis risk factors. Aortic Atherosclerosis (ICD10-I70.0). Electronically Signed   By: Narda Rutherford M.D.   On: 10/13/2023 19:58   DG Femur Min 2 Views Right  Result Date: 10/13/2023 CLINICAL DATA:  Status post fall with right leg pain. EXAM: RIGHT FEMUR 2 VIEWS; PELVIS - 1-2 VIEW COMPARISON:  None Available. FINDINGS: Femur: Displaced intertrochanteric femur fracture. Distal femur is intact. Knee alignment is maintained. Pelvis: No additional fracture of the pelvis. No hip dislocation. Mild bilateral hip osteoarthritis. No pubic symphyseal or sacroiliac diastasis. IMPRESSION: Displaced intertrochanteric femur fracture. Electronically Signed   By: Narda Rutherford M.D.   On: 10/13/2023 19:20   DG Pelvis 1-2 Views  Result Date: 10/13/2023 CLINICAL DATA:  Status post fall with right leg pain. EXAM: RIGHT FEMUR 2 VIEWS; PELVIS - 1-2 VIEW COMPARISON:  None Available. FINDINGS: Femur: Displaced intertrochanteric  femur fracture. Distal femur is intact. Knee alignment is maintained. Pelvis: No additional fracture of the pelvis. No hip dislocation. Mild bilateral hip osteoarthritis. No pubic symphyseal or sacroiliac diastasis. IMPRESSION: Displaced intertrochanteric femur fracture. Electronically Signed   By: Narda Rutherford M.D.   On: 10/13/2023 19:20    PMH:   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)    Depression    Diabetes mellitus without complication (HCC) 2019   GERD (gastroesophageal reflux disease)    Hyperlipidemia    Hypertension    Necrotizing fasciitis (HCC) 10/13/2017   Neuromuscular disorder (HCC)    neuropathy   PAD (peripheral artery disease) (HCC)    Secondary hyperparathyroidism (HCC)    Wound dehiscence 11/24/2017    PSH:   Past Surgical History:  Procedure Laterality Date   AMPUTATION Right 10/13/2017   Procedure: RIGHT BELOW KNEE  AMPUTATION;  Surgeon: Nadara Mustard, MD;  Location: [facility] OR;  Service: Orthopedics;  Laterality: Right;   AMPUTATION Right 11/24/2017   Procedure: AMPUTATION BELOW KNEE REVISION;  Surgeon: Nadara Mustard, MD;  Location: [facility] OR;  Service: Orthopedics;  Laterality: Right;   AMPUTATION TOE Left    BIOPSY  09/02/2021   Procedure: BIOPSY;  Surgeon: Imogene Burn, MD;  Location: [facility] ENDOSCOPY;  Service: Gastroenterology;;   BIOPSY  02/18/2023   Procedure: BIOPSY;  Surgeon: Benancio Deeds, MD;  Location: [facility] ENDOSCOPY;  Service: Gastroenterology;;   COLONOSCOPY  05/11/2019   ESOPHAGOGASTRODUODENOSCOPY (EGD) WITH PROPOFOL N/A 09/02/2021   Procedure: ESOPHAGOGASTRODUODENOSCOPY (EGD) WITH PROPOFOL;  Surgeon: Imogene Burn, MD;  Location: [facility] ENDOSCOPY;  Service: Gastroenterology;  Laterality: N/A;   ESOPHAGOGASTRODUODENOSCOPY (EGD) WITH PROPOFOL N/A 02/18/2023    Procedure: ESOPHAGOGASTRODUODENOSCOPY (EGD) WITH PROPOFOL;  Surgeon: Benancio Deeds, MD;  Location: [facility] ENDOSCOPY;  Service: Gastroenterology;  Laterality: N/A;   NO PAST SURGERIES     POLYPECTOMY     WISDOM TOOTH EXTRACTION      Allergies:  Allergies  Allergen Reactions   Iron Nausea And Vomiting    With the iron tablets   Tape Rash    Rash at site of tape--okay to use paper tape    Medications:   Prior to Admission medications   Medication Sig Start Date End Date Taking? Authorizing Provider  amLODipine (NORVASC) 5 MG tablet TAKE 1 TABLET(5 MG) BY MOUTH AT BEDTIME Patient taking differently: Take 5 mg by mouth daily. 10/12/23  Yes [name], MD  atorvastatin (LIPITOR) 40 MG tablet Take 1 tablet (40 mg total) by mouth at bedtime. Patient taking differently: Take 40 mg by mouth daily. 12/14/22  Yes [name], MD  baclofen (LIORESAL) 10 MG tablet TAKE 1/2 TABLET BY MOUTH TWICE DAILY Patient taking differently: Take 10 mg by mouth daily. 10/04/23  Yes [name], MD  calcitRIOL (ROCALTROL) 0.5 MCG capsule Take 0.5 mcg by mouth in the morning and at bedtime. 09/08/22  Yes [provider]  carvedilol (COREG) 12.5 MG tablet Take 12.5 mg by mouth 2 (two) times daily with a meal.   Yes [provider]  diltiazem (CARDIZEM CD) 300 MG 24 hr capsule TAKE 1 CAPSULE(300 MG) BY MOUTH AT BEDTIME 10/21/22  Yes Ursuy, Renee Lynn, PA-C  FARXIGA 10 MG TABS tablet TAKE 1 TABLET BY MOUTH EVERY DAY 09/15/23  Yes [name], MD  FLUoxetine (PROZAC) 10 MG capsule TAKE 1 CAPSULE(10 MG) BY MOUTH DAILY Patient taking differently: Take 10 mg by mouth daily. 10/04/23  Yes [name], MD  furosemide (LASIX) 40 MG tablet Take 40 mg by mouth daily.   Yes [provider]  gabapentin (NEURONTIN) 300 MG capsule TAKE 1 CAPSULE(300 MG) BY MOUTH DAILY 05/26/23  Yes [name], MD  HUMALOG KWIKPEN 100 UNIT/ML KwikPen Inject 6 Units into the skin 2 (two)  times daily before lunch and supper. Dose per sliding scale 02/25/23  Yes McDiarmid, Leighton Roach, MD  hydrALAZINE (APRESOLINE) 50 MG tablet TAKE 1 AND 1/2 TABLETS(75 MG) BY MOUTH THREE TIMES DAILY Patient taking differently: Take 100 mg by mouth in the morning and at bedtime. 07/06/23  Yes Alicia Amel, MD  Insulin Disposable Pump (OMNIPOD DASH PODS, GEN 4,) MISC  04/15/22  Yes [provider]  metoprolol succinate (TOPROL XL) 50 MG 24 hr tablet TAKE 100 MG IN THE AM AND TAKE 50 MG IN THE PM Patient taking differently: Take 50 mg  by mouth in the morning and at bedtime. 07/19/23  Yes Sheilah Pigeon, PA-C  ondansetron (ZOFRAN-ODT) 8 MG disintegrating tablet DISSOLVE 1 TABLET(8 MG) ON THE TONGUE EVERY 8 HOURS AS NEEDED FOR NAUSEA OR VOMITING 08/12/23  Yes [name], MD  sevelamer carbonate (RENVELA) 800 MG tablet Take 2,400 mg by mouth in the morning and at bedtime.   Yes [provider]  Lawrence Marseilles LANCETS MISC Check blood sugars before meals twice daily 11/19/17   Julieanne Manson, MD  Blood Glucose Monitoring Suppl (ACCU-CHEK GUIDE ME) w/Device KIT See admin instructions. 02/23/23   [provider]  glucose blood (AGAMATRIX PRESTO TEST) test strip Check blood sugars twice daily before meals 02/02/18   Julieanne Manson, MD  glucose blood test strip 1 each by Other route as needed for other. Use as instructed    [provider]    Discontinued Meds:   Medications Discontinued During This Encounter  Medication Reason   acetaminophen (TYLENOL) 325 MG tablet Patient Preference   insulin degludec (TRESIBA FLEXTOUCH) 100 UNIT/ML FlexTouch Pen Discontinued by provider   chlorhexidine (HIBICLENS) 4 % liquid 4 Application    povidone-iodine 10 % swab 2 Application    HYDROmorphone (DILAUDID) injection 1 mg    HYDROmorphone (DILAUDID) injection 0.5 mg    FLUoxetine (PROZAC) capsule 10 mg     Social History:  reports that he has been smoking  cigarettes. He started smoking about 34 years ago. He has a 17.7 pack-year smoking history. He has never used smokeless tobacco. He reports that he does not drink alcohol and does not use drugs.  Family History:   Family History  Problem Relation Age of Onset   Diabetes Mellitus II Mother    Depression Mother    Colon polyps Mother    Hypertension Mother    High Cholesterol Mother    Heart disease Father        CABG x 4.  04/2017   Diabetes Father    High Cholesterol Father    Hypertension Father    Heart disease Maternal Grandmother    Heart disease Maternal Grandfather    Colon cancer Neg Hx    Esophageal cancer Neg Hx    Liver cancer Neg Hx    Pancreatic cancer Neg Hx    Rectal cancer Neg Hx    Stomach cancer Neg Hx     Blood pressure (!) 144/56, pulse 100, temperature 98.6 F (37 C), resp. rate 17, height 6\' 5"  (1.956 m), weight 117 kg, SpO2 95%. GEN: supine in bed, mild distress from pain ENT: no nasal discharge EYES: no scleral icterus, EOMI CV: normal rate PULM: no rales or wheezing ABD: NABS, non-distended SKIN: no rashes or jaundice EXT: no edema, warm and well perfused, rt BKA Neuro: no obvious focal deficits, alert and oriented Psych: depressed mood and blunted affect    Won Kreuzer, Len Blalock, MD 10/14/2023, 2:19 PM

## 2023-10-14 NOTE — Assessment & Plan Note (Signed)
Per recent nephrology note switched from metoprolol to Coreg 12.5.  Per family medicine note yesterday outpatient, started amlodipine 5.  BP elevated to the 200s systolic on admission likely in setting of pain. Continue home amlodipine 5 once daily Continue home carvedilol 12.5 once daily Continue diltiazem 300 mg once daily Continue hydralazine 50 mg 3 times daily
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
## 2023-10-14 NOTE — H&P (View-Only) (Signed)
Reason for Consult:Right hip fx Referring Physician: Levert Feinstein Time called: [id number] Time at bedside: 0856   [name] [name] is an 52 y.o. male.  HPI: Kishaun fell yesterday. He's unsure what caused the fall. He had immediate right hip pain and could not get up. He was brought to the ED where x-rays showed a right hip fx and orthopedic surgery was consulted. He lives at home with family and ambulates with a cane and prosthesis.  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)    Depression    Diabetes mellitus without complication (HCC) 2019   GERD (gastroesophageal reflux disease)    Hyperlipidemia    Hypertension    Necrotizing fasciitis (HCC) 10/13/2017   Neuromuscular disorder (HCC)    neuropathy   PAD (peripheral artery disease) (HCC)    Secondary hyperparathyroidism (HCC)    Wound dehiscence 11/24/2017    Past Surgical History:  Procedure Laterality Date   AMPUTATION Right 10/13/2017   Procedure: RIGHT BELOW KNEE AMPUTATION;  Surgeon: Nadara Mustard, MD;  Location: [facility] OR;  Service: Orthopedics;  Laterality: Right;   AMPUTATION Right 11/24/2017   Procedure: AMPUTATION BELOW KNEE REVISION;  Surgeon: Nadara Mustard, MD;  Location: [facility] OR;  Service: Orthopedics;  Laterality: Right;   AMPUTATION TOE Left    BIOPSY  09/02/2021   Procedure: BIOPSY;  Surgeon: Imogene Burn, MD;  Location: [facility] ENDOSCOPY;  Service: Gastroenterology;;   BIOPSY  02/18/2023   Procedure: BIOPSY;  Surgeon: Benancio Deeds, MD;  Location: [facility] ENDOSCOPY;  Service: Gastroenterology;;   COLONOSCOPY  05/11/2019   ESOPHAGOGASTRODUODENOSCOPY (EGD) WITH PROPOFOL N/A 09/02/2021   Procedure: ESOPHAGOGASTRODUODENOSCOPY (EGD) WITH PROPOFOL;  Surgeon: Imogene Burn, MD;  Location: [facility] ENDOSCOPY;  Service: Gastroenterology;   Laterality: N/A;   ESOPHAGOGASTRODUODENOSCOPY (EGD) WITH PROPOFOL N/A 02/18/2023   Procedure: ESOPHAGOGASTRODUODENOSCOPY (EGD) WITH PROPOFOL;  Surgeon: Benancio Deeds, MD;  Location: [facility] ENDOSCOPY;  Service: Gastroenterology;  Laterality: N/A;   NO PAST SURGERIES     POLYPECTOMY     WISDOM TOOTH EXTRACTION      Family History  Problem Relation Age of Onset   Diabetes Mellitus II Mother    Depression Mother    Colon polyps Mother    Hypertension Mother    High Cholesterol Mother    Heart disease Father        CABG x 4.  04/2017   Diabetes Father    High Cholesterol Father    Hypertension Father    Heart disease Maternal Grandmother    Heart disease Maternal Grandfather    Colon cancer Neg Hx    Esophageal cancer Neg Hx    Liver cancer Neg Hx    Pancreatic cancer Neg Hx    Rectal cancer Neg Hx    Stomach cancer Neg Hx     Social History:  reports that he has been smoking cigarettes. He started smoking about 34 years ago. He has a 17.7 pack-year smoking history. He has never used smokeless tobacco. He reports that he does not drink alcohol and does not use drugs.  Allergies:  Allergies  Allergen Reactions   Iron Nausea And Vomiting    With the iron tablets   Tape Rash    Rash at site of tape--okay to use paper tape  Medications: I have reviewed the patient's current medications.  Results for orders placed or performed during the hospital encounter of 10/13/23 (from the past 48 hour(s))  Basic metabolic panel     Status: Abnormal   Collection Time: 10/13/23  7:48 PM  Result Value Ref Range   Sodium 140 135 - 145 mmol/L   Potassium 4.9 3.5 - 5.1 mmol/L   Chloride 114 (H) 98 - 111 mmol/L   CO2 17 (L) 22 - 32 mmol/L   Glucose, Bld 216 (H) 70 - 99 mg/dL    Comment: Glucose reference range applies only to samples taken after fasting for at least 8 hours.   BUN 78 (H) 6 - 20 mg/dL   Creatinine, Ser 1.61 (H) 0.61 - 1.24 mg/dL   Calcium 7.8 (L) 8.9 - 10.3 mg/dL    GFR, Estimated 6 (L) >60 mL/min    Comment: (NOTE) Calculated using the CKD-EPI Creatinine Equation (2021)    Anion gap 9 5 - 15    Comment: Performed at [facility], [address] [place], [place] [address]  CBC with Differential     Status: Abnormal   Collection Time: 10/13/23  7:48 PM  Result Value Ref Range   WBC 14.3 (H) 4.0 - 10.5 K/uL   RBC 2.50 (L) 4.22 - 5.81 MIL/uL   Hemoglobin 7.0 (L) 13.0 - 17.0 g/dL   HCT 54.0 (L) 98.1 - 19.1 %   MCV 93.6 80.0 - 100.0 fL   MCH 28.0 26.0 - 34.0 pg   MCHC 29.9 (L) 30.0 - 36.0 g/dL   RDW 47.8 (H) 29.5 - 62.1 %   Platelets 188 150 - 400 K/uL   nRBC 0.0 0.0 - 0.2 %   Neutrophils Relative % 82 %   Neutro Abs 11.7 (H) 1.7 - 7.7 K/uL   Lymphocytes Relative 9 %   Lymphs Abs 1.3 0.7 - 4.0 K/uL   Monocytes Relative 6 %   Monocytes Absolute 0.8 0.1 - 1.0 K/uL   Eosinophils Relative 1 %   Eosinophils Absolute 0.2 0.0 - 0.5 K/uL   Basophils Relative 0 %   Basophils Absolute 0.0 0.0 - 0.1 K/uL   Immature Granulocytes 2 %   Abs Immature Granulocytes 0.29 (H) 0.00 - 0.07 K/uL    Comment: Performed at [facility], [address], [place], [place] [address]  Brain natriuretic peptide     Status: Abnormal   Collection Time: 10/13/23  7:48 PM  Result Value Ref Range   B Natriuretic Peptide 279.3 (H) 0.0 - 100.0 pg/mL    Comment: Performed at [facility], [address] [place], [place] [address]  Protime-INR     Status: None   Collection Time: 10/13/23  8:08 PM  Result Value Ref Range   Prothrombin Time 13.4 11.4 - 15.2 seconds   INR 1.0 0.8 - 1.2    Comment: (NOTE) INR goal varies based on device and disease states. Performed at [facility], [address], [place], [place] [address]   Lipase, blood     Status: None   Collection Time: 10/13/23  8:08 PM  Result Value Ref Range   Lipase 50 11 - 51 U/L    Comment: Performed at [facility], [address] [place], [place] [address]  Urinalysis, Routine w  reflex microscopic -Urine, Clean Catch     Status: Abnormal   Collection Time: 10/13/23 10:50 PM  Result Value Ref Range  Color, Urine STRAW (A) YELLOW   APPearance CLEAR CLEAR   Specific Gravity, Urine 1.010 1.005 - 1.030   pH 5.0 5.0 - 8.0   Glucose, UA >=500 (A) NEGATIVE mg/dL   Hgb urine dipstick NEGATIVE NEGATIVE   Bilirubin Urine NEGATIVE NEGATIVE   Ketones, ur NEGATIVE NEGATIVE mg/dL   Protein, ur >=161 (A) NEGATIVE mg/dL   Nitrite NEGATIVE NEGATIVE   Leukocytes,Ua NEGATIVE NEGATIVE   RBC / HPF 0-5 0 - 5 RBC/hpf   WBC, UA 0-5 0 - 5 WBC/hpf   Bacteria, UA RARE (A) NONE SEEN   Squamous Epithelial / HPF 0-5 0 - 5 /HPF    Comment: Performed at [facility], [address], [place], [place] [address]  HIV Antibody (routine testing w rflx)     Status: None   Collection Time: 10/13/23 10:50 PM  Result Value Ref Range   HIV Screen 4th Generation wRfx Non Reactive Non Reactive    Comment: Performed at [facility], [address], [place], [place] [address]  Hemoglobin A1c     Status: Abnormal   Collection Time: 10/13/23 10:50 PM  Result Value Ref Range   Hgb A1c MFr Bld 6.1 (H) 4.8 - 5.6 %    Comment: (NOTE) Pre diabetes:          5.7%-6.4%  Diabetes:              >6.4%  Glycemic control for   <7.0% adults with diabetes    Mean Plasma Glucose 128.37 mg/dL    Comment: Performed at [facility], [address], [place], [place] [address]  POC occult blood, ED Provider will collect     Status: None   Collection Time: 10/13/23 10:54 PM  Result Value Ref Range   Fecal Occult Bld NEGATIVE NEGATIVE  Glucose, capillary     Status: Abnormal   Collection Time: 10/14/23 12:14 AM  Result Value Ref Range   Glucose-Capillary 171 (H) 70 - 99 mg/dL    Comment: Glucose reference range applies only to samples taken after fasting for at least 8 hours.  Type and screen [facility]     Status: None (Preliminary result)   Collection Time: 10/14/23  3:23 AM   Result Value Ref Range   ABO/RH(D) A NEG    Antibody Screen NEG    Sample Expiration 10/17/2023,2359    Unit Number [id number]    Blood Component Type RBC LR PHER2    Unit division 00    Status of Unit ISSUED    Transfusion Status OK TO TRANSFUSE    Crossmatch Result      Compatible Performed at [facility], [address] [place], [place] [address]    Unit Number [id number]    Blood Component Type RED CELLS,LR    Unit division 00    Status of Unit ISSUED    Transfusion Status OK TO TRANSFUSE    Crossmatch Result Compatible   Renal function panel     Status: Abnormal   Collection Time: 10/14/23  3:24 AM  Result Value Ref Range   Sodium 139 135 - 145 mmol/L   Potassium 4.9 3.5 - 5.1 mmol/L   Chloride 113 (H) 98 - 111 mmol/L   CO2 17 (L) 22 - 32 mmol/L   Glucose, Bld 120 (H) 70 - 99 mg/dL    Comment: Glucose reference range applies only to samples taken after fasting for at least 8 hours.  BUN 76 (H) 6 - 20 mg/dL   Creatinine, Ser 96.29 (H) 0.61 - 1.24 mg/dL   Calcium 7.5 (L) 8.9 - 10.3 mg/dL   Phosphorus 6.3 (H) 2.5 - 4.6 mg/dL   Albumin 2.4 (L) 3.5 - 5.0 g/dL   GFR, Estimated 6 (L) >60 mL/min    Comment: (NOTE) Calculated using the CKD-EPI Creatinine Equation (2021)    Anion gap 9 5 - 15    Comment: Performed at [facility], [address], [place], [place] [address]  Hepatic function panel     Status: Abnormal   Collection Time: 10/14/23  3:25 AM  Result Value Ref Range   Total Protein 5.2 (L) 6.5 - 8.1 g/dL   Albumin 2.3 (L) 3.5 - 5.0 g/dL   AST 8 (L) 15 - 41 U/L   ALT 10 0 - 44 U/L   Alkaline Phosphatase 50 38 - 126 U/L   Total Bilirubin 0.6 <1.2 mg/dL   Bilirubin, Direct <3.2 0.0 - 0.2 mg/dL   Indirect Bilirubin NOT CALCULATED 0.3 - 0.9 mg/dL    Comment: Performed at [facility], [address] [place], [place] [address]  VITAMIN D 25 Hydroxy (Vit-D Deficiency, Fractures)     Status: Abnormal   Collection Time: 10/14/23  3:25 AM   Result Value Ref Range   Vit D, 25-Hydroxy 10.22 (L) 30 - 100 ng/mL    Comment: (NOTE) Vitamin D deficiency has been defined by the Institute of Medicine  and an Endocrine Society practice guideline as a level of serum 25-OH  vitamin D less than 20 ng/mL (1,2). The Endocrine Society went on to  further define vitamin D insufficiency as a level between 21 and 29  ng/mL (2).  1. IOM (Institute of Medicine). 2010. Dietary reference intakes for  calcium and D. [place]: [employer]. 2. Holick MF, Binkley Adel, Bischoff-Ferrari HA, et al. Evaluation,  treatment, and prevention of vitamin D deficiency: an Endocrine  Society clinical practice guideline, JCEM. 2011 Jul; 96(7): 1911-30.  Performed at [facility], [address], [place], [place] [address]   Iron and TIBC     Status: Abnormal   Collection Time: 10/14/23  3:25 AM  Result Value Ref Range   Iron 19 (L) 45 - 182 ug/dL   TIBC 664 (L) 403 - 474 ug/dL   Saturation Ratios 8 (L) 17.9 - 39.5 %   UIBC 206 ug/dL    Comment: Performed at [facility], [address], [place], [place] [address]  Ferritin     Status: None   Collection Time: 10/14/23  3:25 AM  Result Value Ref Range   Ferritin 39 24 - 336 ng/mL    Comment: Performed at [facility], [address], [place], [place] [address]  Prepare RBC (crossmatch)     Status: None   Collection Time: 10/14/23  3:25 AM  Result Value Ref Range   Order Confirmation      ORDER PROCESSED BY BLOOD BANK Performed at [facility], [address] [place], [place] [address]   CBC     Status: Abnormal   Collection Time: 10/14/23  3:25 AM  Result Value Ref Range   WBC 10.5 4.0 - 10.5 K/uL   RBC 2.29 (L) 4.22 - 5.81 MIL/uL   Hemoglobin 6.3 (LL) 13.0 - 17.0 g/dL    Comment: REPEATED TO VERIFY THIS CRITICAL RESULT HAS VERIFIED AND BEEN CALLED TO [facility]  RN BY ROIDER SATRAIN ON 11 21 2024 AT 0349, AND HAS BEEN READ BACK.     HCT 21.2 (L)  39.0 - 52.0 %   MCV 92.6 80.0 - 100.0 fL   MCH 27.5 26.0 - 34.0 pg   MCHC 29.7 (L) 30.0 - 36.0 g/dL   RDW 16.1 (H) 09.6 - 04.5 %   Platelets 158 150 - 400 K/uL   nRBC 0.0 0.0 - 0.2 %    Comment: Performed at [facility], [address] [place], [place] [address]  Glucose, capillary     Status: Abnormal   Collection Time: 10/14/23  3:43 AM  Result Value Ref Range   Glucose-Capillary 108 (H) 70 - 99 mg/dL    Comment: Glucose reference range applies only to samples taken after fasting for at least 8 hours.  Glucose, capillary     Status: Abnormal   Collection Time: 10/14/23  7:54 AM  Result Value Ref Range   Glucose-Capillary 103 (H) 70 - 99 mg/dL    Comment: Glucose reference range applies only to samples taken after fasting for at least 8 hours.    CT HEAD WO CONTRAST ( )  Result Date: 10/14/2023 CLINICAL DATA:  Ataxia, head trauma Frequent falls, head trauma 1 week ago EXAM: CT HEAD WITHOUT CONTRAST TECHNIQUE: Contiguous axial images were obtained from the base of the skull through the vertex without intravenous contrast. RADIATION DOSE REDUCTION: This exam was performed according to the departmental dose-optimization program which includes automated exposure control, adjustment of the mA and/or kV according to patient size and/or use of iterative reconstruction technique. COMPARISON:  None Available. FINDINGS: Limitations: Motion degraded exam Brain: No hemorrhage. No hydrocephalus. No extra-axial fluid collection. No CT evidence of an acute cortical infarct. No mass effect. No mass lesion. Vascular: No hyperdense vessel or unexpected calcification. Skull: Normal. Negative for fracture or focal lesion. Sinuses/Orbits: No middle ear or mastoid effusion. Paranasal sinuses are notable for mild mucosal thickening of the floor of bilateral maxillary sinuses. Orbits are unremarkable. Other: None. IMPRESSION: No CT evidence of intracranial injury. Electronically Signed   By: Lorenza Cambridge  M.D.   On: 10/14/2023 08:17   CT L-SPINE NO CHARGE  Result Date: 10/13/2023 CLINICAL DATA:  Pain after fall. EXAM: CT LUMBAR SPINE WITHOUT CONTRAST TECHNIQUE: Multidetector CT imaging of the lumbar spine was performed without intravenous contrast administration. Multiplanar CT image reconstructions were also generated. RADIATION DOSE REDUCTION: This exam was performed according to the departmental dose-optimization program which includes automated exposure control, adjustment of the mA and/or kV according to patient size and/or use of iterative reconstruction technique. COMPARISON:  None Available. FINDINGS: Segmentation: 5 lumbar type vertebrae. Alignment: Normal. Vertebrae: No evidence of acute fracture. Schmorl's node involving superior endplate of L1 and L2. The posterior elements are intact. Paraspinal and other soft tissues: Assessed on concurrent abdominopelvic CT, reported separately. Disc levels: Mild facet hypertrophy and broad-based disc bulge at multiple levels. IMPRESSION: 1. No acute fracture or subluxation of the lumbar spine. 2. Mild degenerative disc disease and facet hypertrophy. Electronically Signed   By: Narda Rutherford M.D.   On: 10/13/2023 20:23   CT ABDOMEN PELVIS WO CONTRAST  Result Date: 10/13/2023 CLINICAL DATA:  Abdominal pain, fall.  Sacral pain. EXAM: CT ABDOMEN AND PELVIS WITHOUT CONTRAST TECHNIQUE: Multidetector CT imaging of the abdomen and pelvis was performed following the standard protocol without IV contrast. RADIATION DOSE REDUCTION: This exam was performed according to the departmental dose-optimization program which includes automated exposure  control, adjustment of the mA and/or kV according to patient size and/or use of iterative reconstruction technique. COMPARISON:  Noncontrast CT 09/02/2021 FINDINGS: Lower chest: Small left pleural effusion demonstrates simple fluid density. There is also a trace right pleural effusion. Hepatobiliary: Assessment for injury is  limited in the absence of IV contrast. No evidence of focal hepatic abnormality or perihepatic hematoma. Subtle capsular nodularity of the liver. Multiple gallstones without abnormal gallbladder distension. Pancreas: Mild fat stranding about the pancreatic head and body. No ductal dilatation. Spleen: Unremarkable unenhanced appearance. Adrenals/Urinary Tract: Chronic left adrenal thickening. No evidence of adrenal hemorrhage. Bilateral renal parenchymal atrophy. Symmetric perinephric fat stranding. No renal calculi. Unremarkable urinary bladder. Stomach/Bowel: Moderate gastric distention with fluid. Mild wall thickening about the duodenum adjacent to pancreatic stranding. No small bowel obstruction or obvious small bowel wall thickening. Colonic diverticulosis without diverticulitis. Normal appendix. Vascular/Lymphatic: Aortic atherosclerosis. No periaortic stranding or retroperitoneal fluid to suggest injury. No abdominopelvic adenopathy. Reproductive: Unremarkable. Other: Generalized body wall and subcutaneous edema. Mild edema of the mesenteric fat, more prominent taste in the pancreas. Small fat containing inguinal hernias. Musculoskeletal: Comminuted intertrochanteric right proximal femur fracture with mild displacement. No additional fracture of the pelvis. No sacral fracture. Lumbar spine assessed on concurrent lumbar spine reformats, reported separately. No fracture of included ribs. IMPRESSION: 1. Comminuted intertrochanteric right proximal femur fracture with mild displacement. 2. No additional traumatic injury in the abdomen/pelvis. 3. Mild fat stranding about the pancreatic head and body. There is also generalized edema of the mesenteric and subcutaneous fat. Recommend correlation with pancreatic enzymes for pancreatitis. 4. Cholelithiasis. Colonic diverticulosis without diverticulitis. 5. Small left and trace right pleural effusions. 6. Subtle capsular nodularity of the liver can be seen with cirrhosis.  Recommend correlation for cirrhosis risk factors. Aortic Atherosclerosis (ICD10-I70.0). Electronically Signed   By: Narda Rutherford M.D.   On: 10/13/2023 19:58   DG Femur Min 2 Views Right  Result Date: 10/13/2023 CLINICAL DATA:  Status post fall with right leg pain. EXAM: RIGHT FEMUR 2 VIEWS; PELVIS - 1-2 VIEW COMPARISON:  None Available. FINDINGS: Femur: Displaced intertrochanteric femur fracture. Distal femur is intact. Knee alignment is maintained. Pelvis: No additional fracture of the pelvis. No hip dislocation. Mild bilateral hip osteoarthritis. No pubic symphyseal or sacroiliac diastasis. IMPRESSION: Displaced intertrochanteric femur fracture. Electronically Signed   By: Narda Rutherford M.D.   On: 10/13/2023 19:20   DG Pelvis 1-2 Views  Result Date: 10/13/2023 CLINICAL DATA:  Status post fall with right leg pain. EXAM: RIGHT FEMUR 2 VIEWS; PELVIS - 1-2 VIEW COMPARISON:  None Available. FINDINGS: Femur: Displaced intertrochanteric femur fracture. Distal femur is intact. Knee alignment is maintained. Pelvis: No additional fracture of the pelvis. No hip dislocation. Mild bilateral hip osteoarthritis. No pubic symphyseal or sacroiliac diastasis. IMPRESSION: Displaced intertrochanteric femur fracture. Electronically Signed   By: Narda Rutherford M.D.   On: 10/13/2023 19:20   DG Wrist Complete Left  Result Date: 10/12/2023 CLINICAL DATA:  rule out scaphoid fx; add scaphoid view EXAM: LEFT WRIST - COMPLETE 3+ VIEW COMPARISON:  None Available. FINDINGS: Normal alignment. No acute fracture. The soft tissues are unremarkable. IMPRESSION: No acute osseous abnormality. Electronically Signed   By: Olive Bass M.D.   On: 10/12/2023 16:36   [place] LIMITED JOINT SPACE STRUCTURES LOW LEFT  Result Date: 10/12/2023 Ultrasound of the left knee INDICATION: Lt knee pain and swelling Findings: Small effusion in the suprapatellar pouch Patellar and quadriceps tendons both with trace hypoechoic intratendinous  changes and some  mild hypoechoic fluid surrounding the tendons Some soft tissue edema of the Hoffa's fat pad Summary: Patella tendinitis with a small effusion. Ultrasound and interpretation by Dr. Webb Silversmith and Dr. Christella Hartigan Limited US of the Left Wrist: Indications: Lt wrist pain s/p fall, r/o possible fx FINDINGS: 1st compartment: visualized in SAX and LAX.  APL and EPB fibers intact with some hypoechoic fluid surrounding the tendon from the distal radius to the wrist joint 2nd compartment: ECRB and ECRL visualized and LAX and SAX.  There is some mild hypoechoic fluid surrounding the tendons at the level of the wrist joint.  All fibers are intact 3rd compartment: EPL with some trace hypoechoic fluid at the distal insertion seen in SAX and LAX 4th compartment: ED and EI visualized and SA X and LAX.  There is hypoechoic fluid changes within the tendon sheath from Lister's tubercle down to the distal insertion points. Scaphoid: Visualized in LAX and SAX there is some hypoechoic fluid overlying the ostium.  No defects of the ostium appreciated. Summary: Findings consistent with tendinosis of the 1-4 compartments.  All fibers are intact.  Worsened inflammation around the extensor digitorum at the level of the wrist joint.  There is some mild edema over the dorsal scaphoid surface as well. Ultrasound and interpretation by Dr. Webb Silversmith and Dr. Christella Hartigan    Review of Systems  HENT:  Negative for ear discharge, ear pain, hearing loss and tinnitus.   Eyes:  Negative for photophobia and pain.  Respiratory:  Negative for cough and shortness of breath.   Cardiovascular:  Negative for chest pain.  Gastrointestinal:  Negative for abdominal pain, nausea and vomiting.  Genitourinary:  Negative for dysuria, flank pain, frequency and urgency.  Musculoskeletal:  Positive for arthralgias (Right hip). Negative for back pain, myalgias and neck pain.  Neurological:  Negative for dizziness and headaches.  Hematological:  Does not  bruise/bleed easily.  Psychiatric/Behavioral:  The patient is not nervous/anxious.    Blood pressure (!) 148/77, pulse 96, temperature 97.8 F (36.6 C), temperature source Oral, resp. rate 16, height 6\' 5"  (1.956 m), weight 117 kg, SpO2 95%. Physical Exam Constitutional:      General: He is not in acute distress.    Appearance: He is well-developed. He is not diaphoretic.  HENT:     Head: Normocephalic and atraumatic.  Eyes:     General: No scleral icterus.       Right eye: No discharge.        Left eye: No discharge.     Conjunctiva/sclera: Conjunctivae normal.  Cardiovascular:     Rate and Rhythm: Normal rate and regular rhythm.  Pulmonary:     Effort: Pulmonary effort is normal. No respiratory distress.  Musculoskeletal:     Cervical back: Normal range of motion.     Comments: RLE No traumatic wounds, ecchymosis, or rash  Mod TTP hip, s/p BKA  No knee effusion  Knee stable to varus/ valgus and anterior/posterior stress  Skin:    General: Skin is warm and dry.  Neurological:     Mental Status: He is alert.  Psychiatric:        Mood and Affect: Mood normal.        Behavior: Behavior normal.     Assessment/Plan: Right hip fx -- Plan IMN tomorrow with Dr. Jena Gauss. Please keep NPO after MN. Multiple medical problems including AKI on CKD, acute on chronic anemia, HTN, and DM -- per primary service    Freeman Caldron, PA-C Orthopedic Surgery  [phone] 10/14/2023, 9:04 AM

## 2023-10-14 NOTE — Assessment & Plan Note (Addendum)
Denies any abdominal pain but markedly tender to palpation over the right abdomen.  No other associated abdominal symptoms including N/V/D/C/hematochezia.  Voluntary guarding and no rebound.  No concern for peritonitis or acute abdomen given symptoms really only the right side.  May be referred pain from femur fracture.  CT abdomen pelvis did show some mild fat stranding about the pancreatic head and body, but lipase WNL and patient with no overt risk factors for pancreatitis aside from gallstones on CT. If he had gallstone pancreatitis, would expect abdominal pain and elevated lipase.  FOBT negative. Monitor for improvement after surgery Consider further workup should he develop fever, uptrending WBC, abdominal pain without palpation
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
## 2023-10-14 NOTE — Assessment & Plan Note (Signed)
>>  ASSESSMENT AND PLAN FOR T2DM (TYPE 2 DIABETES MELLITUS) (HCC) WRITTEN ON 10/14/2023  2:17 AM BY SANFORD, JAMES B, MD  A1c 6.1%. Holding Farxiga  Holding long-acting insulin , restart as indicated CBGs, SSI very sensitive

## 2023-10-14 NOTE — Assessment & Plan Note (Signed)
Chronic problem.  2.6 mL UOP in past 12 hours, did require I&O earlier tonight with >1L retained. -Bladder scan Q4h, I&O cath as indicated and the place Foley after 2 I&O total

## 2023-10-14 NOTE — Assessment & Plan Note (Addendum)
Falls often, now with increasing frequency.  Likely exacerbated by BKA, new prosthesis, and deconditioning.  Concern for contribution of uremia.  CT head unremarkable. Consult PT/OT following ortho surgery

## 2023-10-14 NOTE — Assessment & Plan Note (Addendum)
Mildly hypertensive, will not treat in the acute setting at this time but will continue to monitor. Continue carvedilol 12.5 mg daily, amlodipine 5 mg daily, diltiazem 300 mg daily, hydralazine 50 mg TID

## 2023-10-15 ENCOUNTER — Encounter (HOSPITAL_COMMUNITY): Payer: Self-pay

## 2023-10-15 ENCOUNTER — Inpatient Hospital Stay (HOSPITAL_COMMUNITY): Payer: 59 | Admitting: Anesthesiology

## 2023-10-15 ENCOUNTER — Inpatient Hospital Stay (HOSPITAL_COMMUNITY): Payer: 59

## 2023-10-15 ENCOUNTER — Other Ambulatory Visit: Payer: Self-pay

## 2023-10-15 ENCOUNTER — Encounter (HOSPITAL_COMMUNITY): Payer: 59

## 2023-10-15 ENCOUNTER — Encounter (HOSPITAL_COMMUNITY)
Admission: EM | Disposition: A | Discharge status: Skilled Nursing Facility | Payer: Self-pay | Source: Home / Self Care | Attending: Family Medicine

## 2023-10-15 DIAGNOSIS — S72001A Fracture of unspecified part of neck of right femur, initial encounter for closed fracture: Secondary | ICD-10-CM | POA: Diagnosis not present

## 2023-10-15 DIAGNOSIS — N189 Chronic kidney disease, unspecified: Secondary | ICD-10-CM | POA: Diagnosis not present

## 2023-10-15 DIAGNOSIS — I499 Cardiac arrhythmia, unspecified: Secondary | ICD-10-CM

## 2023-10-15 DIAGNOSIS — I129 Hypertensive chronic kidney disease with stage 1 through stage 4 chronic kidney disease, or unspecified chronic kidney disease: Secondary | ICD-10-CM

## 2023-10-15 DIAGNOSIS — E1122 Type 2 diabetes mellitus with diabetic chronic kidney disease: Secondary | ICD-10-CM

## 2023-10-15 DIAGNOSIS — S72141A Displaced intertrochanteric fracture of right femur, initial encounter for closed fracture: Secondary | ICD-10-CM

## 2023-10-15 HISTORY — PX: INTRAMEDULLARY (IM) NAIL INTERTROCHANTERIC: SHX5875

## 2023-10-15 HISTORY — PX: IR FLUORO GUIDE CV LINE RIGHT: IMG2283

## 2023-10-15 HISTORY — PX: IR US GUIDE VASC ACCESS RIGHT: IMG2390

## 2023-10-15 LAB — CBC
HCT: 24.6 % — ABNORMAL LOW (ref 39.0–52.0)
HCT: 26.3 % — ABNORMAL LOW (ref 39.0–52.0)
Hemoglobin: 7.7 g/dL — ABNORMAL LOW (ref 13.0–17.0)
Hemoglobin: 8.1 g/dL — ABNORMAL LOW (ref 13.0–17.0)
MCH: 28.5 pg (ref 26.0–34.0)
MCH: 28.4 pg (ref 26.0–34.0)
MCHC: 31.3 g/dL (ref 30.0–36.0)
MCHC: 30.8 g/dL (ref 30.0–36.0)
MCV: 91.1 fL (ref 80.0–100.0)
MCV: 92.3 fL (ref 80.0–100.0)
Platelets: 153 10*3/uL (ref 150–400)
Platelets: 149 10*3/uL — ABNORMAL LOW (ref 150–400)
RBC: 2.7 MIL/uL — ABNORMAL LOW (ref 4.22–5.81)
RBC: 2.85 MIL/uL — ABNORMAL LOW (ref 4.22–5.81)
RDW: 16.8 % — ABNORMAL HIGH (ref 11.5–15.5)
RDW: 16.8 % — ABNORMAL HIGH (ref 11.5–15.5)
WBC: 11.7 10*3/uL — ABNORMAL HIGH (ref 4.0–10.5)
WBC: 14.2 10*3/uL — ABNORMAL HIGH (ref 4.0–10.5)
nRBC: 0 % (ref 0.0–0.2)
nRBC: 0 % (ref 0.0–0.2)

## 2023-10-15 LAB — GLUCOSE, CAPILLARY
Glucose-Capillary: 117 mg/dL — ABNORMAL HIGH (ref 70–99)
Glucose-Capillary: 124 mg/dL — ABNORMAL HIGH (ref 70–99)
Glucose-Capillary: 139 mg/dL — ABNORMAL HIGH (ref 70–99)
Glucose-Capillary: 146 mg/dL — ABNORMAL HIGH (ref 70–99)
Glucose-Capillary: 185 mg/dL — ABNORMAL HIGH (ref 70–99)
Glucose-Capillary: 374 mg/dL — ABNORMAL HIGH (ref 70–99)
Glucose-Capillary: 422 mg/dL — ABNORMAL HIGH (ref 70–99)

## 2023-10-15 LAB — RENAL FUNCTION PANEL
Albumin: 2.2 g/dL — ABNORMAL LOW (ref 3.5–5.0)
Anion gap: 10 (ref 5–15)
BUN: 81 mg/dL — ABNORMAL HIGH (ref 6–20)
CO2: 16 mmol/L — ABNORMAL LOW (ref 22–32)
Calcium: 7.6 mg/dL — ABNORMAL LOW (ref 8.9–10.3)
Chloride: 110 mmol/L (ref 98–111)
Creatinine, Ser: 10.4 mg/dL — ABNORMAL HIGH (ref 0.61–1.24)
GFR, Estimated: 6 mL/min — ABNORMAL LOW (ref >60)
Glucose, Bld: 140 mg/dL — ABNORMAL HIGH (ref 70–99)
Phosphorus: 6.6 mg/dL — ABNORMAL HIGH (ref 2.5–4.6)
Potassium: 5.1 mmol/L (ref 3.5–5.1)
Sodium: 136 mmol/L (ref 135–145)

## 2023-10-15 LAB — HEPATITIS B SURFACE ANTIGEN: Hepatitis B Surface Ag: NONREACTIVE

## 2023-10-15 LAB — SURGICAL PCR SCREEN
MRSA, PCR: NEGATIVE
Staphylococcus aureus: NEGATIVE

## 2023-10-15 SURGERY — FIXATION, FRACTURE, INTERTROCHANTERIC, WITH INTRAMEDULLARY ROD
Anesthesia: General | Laterality: Right

## 2023-10-15 MED ORDER — FENTANYL CITRATE (PF) 250 MCG/5ML IJ SOLN
INTRAMUSCULAR | Status: AC
  Filled 2023-10-15: qty 5

## 2023-10-15 MED ORDER — PHENYLEPHRINE HCL-NACL 20-0.9 MG/250ML-% IV SOLN
INTRAVENOUS | Status: DC | PRN
  Administered 2023-10-15: 50 ug/min via INTRAVENOUS

## 2023-10-15 MED ORDER — VANCOMYCIN HCL 1000 MG IV SOLR
INTRAVENOUS | Status: AC
  Filled 2023-10-15: qty 20

## 2023-10-15 MED ORDER — ACETAMINOPHEN 500 MG PO TABS
1000.0000 mg | ORAL_TABLET | Freq: Once | ORAL | Status: AC
  Administered 2023-10-15: 1000 mg via ORAL
  Filled 2023-10-15: qty 2

## 2023-10-15 MED ORDER — KETAMINE HCL 10 MG/ML IJ SOLN
INTRAMUSCULAR | Status: DC | PRN
  Administered 2023-10-15: 10 mg via INTRAVENOUS

## 2023-10-15 MED ORDER — INSULIN ASPART 100 UNIT/ML IJ SOLN: 0.0000 [IU] | INTRAMUSCULAR | Status: DC | PRN

## 2023-10-15 MED ORDER — PHENYLEPHRINE HCL-NACL 20-0.9 MG/250ML-% IV SOLN
INTRAVENOUS | Status: AC
  Filled 2023-10-15: qty 250

## 2023-10-15 MED ORDER — PROPOFOL 10 MG/ML IV BOLUS
INTRAVENOUS | Status: AC
  Filled 2023-10-15: qty 20

## 2023-10-15 MED ORDER — INSULIN ASPART 100 UNIT/ML IJ SOLN
5.0000 [IU] | Freq: Once | INTRAMUSCULAR | Status: AC
  Administered 2023-10-15: 5 [IU] via SUBCUTANEOUS

## 2023-10-15 MED ORDER — METOCLOPRAMIDE HCL 5 MG PO TABS: 5.0000 mg | ORAL_TABLET | Freq: Three times a day (TID) | ORAL | Status: DC | PRN

## 2023-10-15 MED ORDER — DEXAMETHASONE SODIUM PHOSPHATE 10 MG/ML IJ SOLN
INTRAMUSCULAR | Status: DC | PRN
  Administered 2023-10-15: 5 mg via INTRAVENOUS

## 2023-10-15 MED ORDER — MIDAZOLAM HCL 2 MG/2ML IJ SOLN
INTRAMUSCULAR | Status: DC | PRN
  Administered 2023-10-15: 2 mg via INTRAVENOUS

## 2023-10-15 MED ORDER — FENTANYL CITRATE (PF) 100 MCG/2ML IJ SOLN
INTRAMUSCULAR | Status: AC
  Administered 2023-10-15: 25 ug via INTRAVENOUS
  Filled 2023-10-15: qty 2

## 2023-10-15 MED ORDER — OXYCODONE HCL 5 MG PO TABS
5.0000 mg | ORAL_TABLET | ORAL | Status: DC | PRN
  Filled 2023-10-15: qty 1
  Filled 2023-10-15: qty 2

## 2023-10-15 MED ORDER — DEXAMETHASONE SODIUM PHOSPHATE 10 MG/ML IJ SOLN
INTRAMUSCULAR | Status: AC
  Filled 2023-10-15: qty 1

## 2023-10-15 MED ORDER — OXYCODONE HCL 5 MG PO TABS
10.0000 mg | ORAL_TABLET | ORAL | Status: DC | PRN
Start: 2023-10-15 — End: 2023-10-26
  Administered 2023-10-16 – 2023-10-26 (×22): 15 mg via ORAL
  Filled 2023-10-15 (×22): qty 3

## 2023-10-15 MED ORDER — MIDAZOLAM HCL 2 MG/2ML IJ SOLN
INTRAMUSCULAR | Status: AC
  Filled 2023-10-15: qty 2

## 2023-10-15 MED ORDER — MIDAZOLAM HCL 2 MG/2ML IJ SOLN
INTRAMUSCULAR | Status: AC
Start: 2023-10-15 — End: ?
  Filled 2023-10-15: qty 2

## 2023-10-15 MED ORDER — CHLORHEXIDINE GLUCONATE CLOTH 2 % EX PADS
6.0000 | MEDICATED_PAD | Freq: Every day | CUTANEOUS | Status: DC
  Administered 2023-10-16 – 2023-10-17 (×2): 6 via TOPICAL

## 2023-10-15 MED ORDER — FENTANYL CITRATE (PF) 100 MCG/2ML IJ SOLN
INTRAMUSCULAR | Status: AC
  Filled 2023-10-15: qty 2

## 2023-10-15 MED ORDER — LIDOCAINE 2% (20 MG/ML) 5 ML SYRINGE
INTRAMUSCULAR | Status: DC | PRN
  Administered 2023-10-15: 100 mg via INTRAVENOUS

## 2023-10-15 MED ORDER — FENTANYL CITRATE (PF) 100 MCG/2ML IJ SOLN
25.0000 ug | INTRAMUSCULAR | Status: DC | PRN
  Administered 2023-10-15 (×2): 25 ug via INTRAVENOUS

## 2023-10-15 MED ORDER — ROCURONIUM BROMIDE 10 MG/ML (PF) SYRINGE
PREFILLED_SYRINGE | INTRAVENOUS | Status: DC | PRN
  Administered 2023-10-15: 80 mg via INTRAVENOUS

## 2023-10-15 MED ORDER — FENTANYL CITRATE (PF) 250 MCG/5ML IJ SOLN
INTRAMUSCULAR | Status: DC | PRN
  Administered 2023-10-15: 50 ug via INTRAVENOUS

## 2023-10-15 MED ORDER — LIDOCAINE-EPINEPHRINE 1 %-1:100000 IJ SOLN
INTRAMUSCULAR | Status: AC
  Filled 2023-10-15: qty 1

## 2023-10-15 MED ORDER — BACLOFEN 10 MG PO TABS
10.0000 mg | ORAL_TABLET | Freq: Every day | ORAL | Status: DC
  Administered 2023-10-16 – 2023-10-26 (×11): 10 mg via ORAL
  Filled 2023-10-15 (×11): qty 1

## 2023-10-15 MED ORDER — VANCOMYCIN HCL 1000 MG IV SOLR
INTRAVENOUS | Status: DC | PRN
  Administered 2023-10-15: 1000 mg

## 2023-10-15 MED ORDER — ONDANSETRON HCL 4 MG PO TABS
4.0000 mg | ORAL_TABLET | Freq: Four times a day (QID) | ORAL | Status: DC | PRN
  Administered 2023-10-18: 4 mg via ORAL
  Filled 2023-10-15: qty 1

## 2023-10-15 MED ORDER — AMISULPRIDE (ANTIEMETIC) 5 MG/2ML IV SOLN
10.0000 mg | Freq: Once | INTRAVENOUS | Status: DC | PRN
Start: 2023-10-15 — End: 2023-10-15

## 2023-10-15 MED ORDER — VITAMIN D (ERGOCALCIFEROL) 1.25 MG (50000 UNIT) PO CAPS
50000.0000 [IU] | ORAL_CAPSULE | ORAL | Status: DC
  Administered 2023-10-15 – 2023-10-22 (×2): 50000 [IU] via ORAL
  Filled 2023-10-15 (×2): qty 1

## 2023-10-15 MED ORDER — DOCUSATE SODIUM 100 MG PO CAPS
100.0000 mg | ORAL_CAPSULE | Freq: Two times a day (BID) | ORAL | Status: DC
  Administered 2023-10-16 – 2023-10-18 (×5): 100 mg via ORAL
  Filled 2023-10-15 (×5): qty 1

## 2023-10-15 MED ORDER — KETAMINE HCL 50 MG/5ML IJ SOSY
PREFILLED_SYRINGE | INTRAMUSCULAR | Status: AC
  Filled 2023-10-15: qty 5

## 2023-10-15 MED ORDER — CEFAZOLIN SODIUM-DEXTROSE 2-4 GM/100ML-% IV SOLN
2.0000 g | Freq: Three times a day (TID) | INTRAVENOUS | Status: AC
Start: 2023-10-15 — End: 2023-10-16
  Administered 2023-10-15 – 2023-10-16 (×3): 2 g via INTRAVENOUS
  Filled 2023-10-15 (×3): qty 100

## 2023-10-15 MED ORDER — TRANEXAMIC ACID-NACL 1000-0.7 MG/100ML-% IV SOLN
1000.0000 mg | Freq: Once | INTRAVENOUS | Status: AC
  Administered 2023-10-15: 1000 mg via INTRAVENOUS
  Filled 2023-10-15: qty 100

## 2023-10-15 MED ORDER — ONDANSETRON HCL 4 MG/2ML IJ SOLN
INTRAMUSCULAR | Status: DC | PRN
  Administered 2023-10-15: 4 mg via INTRAVENOUS

## 2023-10-15 MED ORDER — SODIUM CHLORIDE 0.9 % IV SOLN: INTRAVENOUS | Status: DC

## 2023-10-15 MED ORDER — SUGAMMADEX SODIUM 200 MG/2ML IV SOLN
INTRAVENOUS | Status: DC | PRN
  Administered 2023-10-15: 200 mg via INTRAVENOUS

## 2023-10-15 MED ORDER — MUPIROCIN 2 % EX OINT
1.0000 | TOPICAL_OINTMENT | Freq: Two times a day (BID) | CUTANEOUS | Status: AC
  Administered 2023-10-15 – 2023-10-19 (×8): 1 via NASAL

## 2023-10-15 MED ORDER — INSULIN ASPART 100 UNIT/ML IJ SOLN
0.0000 [IU] | Freq: Three times a day (TID) | INTRAMUSCULAR | Status: DC
Start: 2023-10-16 — End: 2023-10-16
  Administered 2023-10-16: 5 [IU] via SUBCUTANEOUS
  Administered 2023-10-16: 3 [IU] via SUBCUTANEOUS

## 2023-10-15 MED ORDER — MIDAZOLAM HCL 2 MG/2ML IJ SOLN
INTRAMUSCULAR | Status: AC | PRN
  Administered 2023-10-15 (×2): .5 mg via INTRAVENOUS

## 2023-10-15 MED ORDER — ONDANSETRON HCL 4 MG/2ML IJ SOLN
INTRAMUSCULAR | Status: AC
  Filled 2023-10-15: qty 2

## 2023-10-15 MED ORDER — PHENOL 1.4 % MT LIQD
1.0000 | OROMUCOSAL | Status: DC | PRN
  Filled 2023-10-15: qty 177

## 2023-10-15 MED ORDER — ORAL CARE MOUTH RINSE: 15.0000 mL | Freq: Once | OROMUCOSAL | Status: AC

## 2023-10-15 MED ORDER — METOCLOPRAMIDE HCL 5 MG/ML IJ SOLN
5.0000 mg | Freq: Three times a day (TID) | INTRAMUSCULAR | Status: DC | PRN
  Filled 2023-10-15: qty 2

## 2023-10-15 MED ORDER — ONDANSETRON HCL 4 MG/2ML IJ SOLN: 4.0000 mg | Freq: Once | INTRAMUSCULAR | Status: DC | PRN

## 2023-10-15 MED ORDER — HYDROMORPHONE HCL 1 MG/ML IJ SOLN
0.5000 mg | INTRAMUSCULAR | Status: DC | PRN
  Administered 2023-10-16 – 2023-10-25 (×19): 1 mg via INTRAVENOUS
  Filled 2023-10-15 (×19): qty 1

## 2023-10-15 MED ORDER — ASPIRIN 325 MG PO TABS
325.0000 mg | ORAL_TABLET | Freq: Every day | ORAL | Status: DC
  Administered 2023-10-16 – 2023-10-18 (×3): 325 mg via ORAL
  Filled 2023-10-15 (×3): qty 1

## 2023-10-15 MED ORDER — PROPOFOL 10 MG/ML IV BOLUS
INTRAVENOUS | Status: DC | PRN
  Administered 2023-10-15: 150 mg via INTRAVENOUS

## 2023-10-15 MED ORDER — ONDANSETRON HCL 4 MG/2ML IJ SOLN
4.0000 mg | Freq: Four times a day (QID) | INTRAMUSCULAR | Status: DC | PRN
Start: 2023-10-15 — End: 2023-10-24
  Administered 2023-10-18 – 2023-10-19 (×2): 4 mg via INTRAVENOUS
  Filled 2023-10-15 (×2): qty 2

## 2023-10-15 MED ORDER — FENTANYL CITRATE (PF) 100 MCG/2ML IJ SOLN
INTRAMUSCULAR | Status: AC | PRN
Start: 2023-10-15 — End: 2023-10-15
  Administered 2023-10-15: 50 ug via INTRAVENOUS

## 2023-10-15 MED ORDER — INSULIN ASPART 100 UNIT/ML IJ SOLN
8.0000 [IU] | Freq: Once | INTRAMUSCULAR | Status: AC
  Administered 2023-10-15: 8 [IU] via SUBCUTANEOUS

## 2023-10-15 MED ORDER — CHLORHEXIDINE GLUCONATE 0.12 % MT SOLN
15.0000 mL | Freq: Once | OROMUCOSAL | Status: AC
Start: 2023-10-15 — End: 2023-10-15
  Administered 2023-10-15: 15 mL via OROMUCOSAL

## 2023-10-15 MED ORDER — HEPARIN SODIUM (PORCINE) 1000 UNIT/ML IJ SOLN
INTRAMUSCULAR | Status: AC
  Filled 2023-10-15: qty 10

## 2023-10-15 MED ORDER — ROCURONIUM BROMIDE 10 MG/ML (PF) SYRINGE
PREFILLED_SYRINGE | INTRAVENOUS | Status: AC
  Filled 2023-10-15: qty 10

## 2023-10-15 MED ORDER — SODIUM CHLORIDE 0.9% FLUSH
3.0000 mL | Freq: Two times a day (BID) | INTRAVENOUS | Status: DC
  Administered 2023-10-16 – 2023-10-26 (×18): 3 mL via INTRAVENOUS

## 2023-10-15 MED ORDER — CEFAZOLIN SODIUM-DEXTROSE 2-4 GM/100ML-% IV SOLN
2.0000 g | INTRAVENOUS | Status: DC
Start: 2023-10-15 — End: 2023-10-15

## 2023-10-15 MED ORDER — POLYETHYLENE GLYCOL 3350 17 G PO PACK: 17.0000 g | PACK | Freq: Every day | ORAL | Status: DC | PRN

## 2023-10-15 MED ORDER — DARBEPOETIN ALFA 100 MCG/0.5ML IJ SOSY
100.0000 ug | PREFILLED_SYRINGE | INTRAMUSCULAR | Status: DC
  Administered 2023-10-15: 100 ug via SUBCUTANEOUS
  Filled 2023-10-15: qty 0.5

## 2023-10-15 MED ORDER — LIDOCAINE 2% (20 MG/ML) 5 ML SYRINGE
INTRAMUSCULAR | Status: AC
  Filled 2023-10-15: qty 5

## 2023-10-15 SURGICAL SUPPLY — 42 items
BAG COUNTER SPONGE SURGICOUNT (BAG) IMPLANT
BIT DRILL INTERTAN LAG SCREW (BIT) IMPLANT
BIT DRILL LONG 4.0 (BIT) IMPLANT
BRUSH SCRUB EZ PLAIN DRY (MISCELLANEOUS) ×2 IMPLANT
CHLORAPREP W/TINT 26 (MISCELLANEOUS) ×1 IMPLANT
COVER PERINEAL POST (MISCELLANEOUS) ×1 IMPLANT
COVER SURGICAL LIGHT HANDLE (MISCELLANEOUS) ×1 IMPLANT
DERMABOND ADVANCED .7 DNX12 (GAUZE/BANDAGES/DRESSINGS) ×1 IMPLANT
DRAPE C-ARM 35X43 STRL (DRAPES) ×1 IMPLANT
DRAPE IMP U-DRAPE 54X76 (DRAPES) ×2 IMPLANT
DRAPE INCISE IOBAN 66X45 STRL (DRAPES) ×1 IMPLANT
DRAPE STERI IOBAN 125X83 (DRAPES) ×1 IMPLANT
DRAPE SURG 17X23 STRL (DRAPES) ×2 IMPLANT
DRAPE U-SHAPE 47X51 STRL (DRAPES) ×1 IMPLANT
DRESSING MEPILEX FLEX 4X4 (GAUZE/BANDAGES/DRESSINGS) ×1 IMPLANT
DRILL BIT LONG 4.0 (BIT) ×1 IMPLANT
DRSG MEPILEX FLEX 4X4 (GAUZE/BANDAGES/DRESSINGS) ×2 IMPLANT
DRSG MEPILEX POST OP 4X8 (GAUZE/BANDAGES/DRESSINGS) ×1 IMPLANT
ELECT REM PT RETURN 9FT ADLT (ELECTROSURGICAL) ×1 IMPLANT
ELECTRODE REM PT RTRN 9FT ADLT (ELECTROSURGICAL) ×1 IMPLANT
GLOVE BIO SURGEON STRL SZ 6.5 (GLOVE) ×3 IMPLANT
GLOVE BIO SURGEON STRL SZ7.5 (GLOVE) ×4 IMPLANT
GLOVE BIOGEL PI IND STRL 6.5 (GLOVE) ×1 IMPLANT
GLOVE BIOGEL PI IND STRL 7.5 (GLOVE) ×1 IMPLANT
GOWN STRL REUS W/ TWL LRG LVL3 (GOWN DISPOSABLE) ×1 IMPLANT
GUIDE PIN 3.2X343 (PIN) ×2 IMPLANT
KIT BASIN OR (CUSTOM PROCEDURE TRAY) ×1 IMPLANT
KIT TURNOVER KIT B (KITS) ×1 IMPLANT
MANIFOLD NEPTUNE II (INSTRUMENTS) ×1 IMPLANT
NAIL INTERTAN 10X18 130D 10S (Nail) IMPLANT
NS IRRIG 1000ML POUR BTL (IV SOLUTION) ×1 IMPLANT
PACK GENERAL/GYN (CUSTOM PROCEDURE TRAY) ×1 IMPLANT
PAD ARMBOARD 7.5X6 YLW CONV (MISCELLANEOUS) ×2 IMPLANT
PIN GUIDE 3.2X343MM (PIN) IMPLANT
SCREW LAG COMPR KIT 105/100 (Screw) IMPLANT
SCREW TRIGEN LOW PROF 5.0X37.5 (Screw) IMPLANT
SUT MNCRL AB 3-0 PS2 18 (SUTURE) ×1 IMPLANT
SUT VIC AB 0 CT1 27XBRD ANBCTR (SUTURE) IMPLANT
SUT VIC AB 2-0 CT1 TAPERPNT 27 (SUTURE) ×2 IMPLANT
SUT VIC AB CT1 27XBRD ANBCTRL (SUTURE)
TOWEL GREEN STERILE (TOWEL DISPOSABLE) ×2 IMPLANT
WATER STERILE IRR 1000ML POUR (IV SOLUTION) ×1 IMPLANT

## 2023-10-15 NOTE — Assessment & Plan Note (Signed)
A1c 6.1%.  CBGs in normal range.  Received 1 unit short acting last 24 hours. Holding [place] [employer] long-acting insulin, restart based on CBGs CBGs, SSI very sensitive

## 2023-10-15 NOTE — Interval H&P Note (Signed)
History and Physical Interval Note:  10/15/2023 8:59 AM  [name] [name]  has presented today for surgery, with the diagnosis of right hip fracture.  The various methods of treatment have been discussed with the patient and family. After consideration of risks, benefits and other options for treatment, the patient has consented to  Procedure(s): INTRAMEDULLARY (IM) NAIL INTERTROCHANTERIC (Right) as a surgical intervention.  The patient's history has been reviewed, patient examined, no change in status, stable for surgery.  I have reviewed the patient's chart and labs.  Questions were answered to the patient's satisfaction.     Caryn Bee P Ercia Crisafulli
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
## 2023-10-15 NOTE — Progress Notes (Signed)
CCC Pre-op Review  Pre-op checklist: Done  NPO: yes   Labs: PCR Neg, Hgb 7.7, Renal panel pending  Consent: signed  H&P: Family Medicine  Vitals: BP elevated  O2 requirements: RA  MAR/PTA review: Coreg due at 0800, Tylenol 1g  IV: 20G  Floor nurse name:  Ginette Otto, RN  Additional info:
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
## 2023-10-15 NOTE — Anesthesia Postprocedure Evaluation (Signed)
Anesthesia Post Note  Patient: [name] [name]  Procedure(s) Performed: INTRAMEDULLARY (IM) NAIL INTERTROCHANTERIC (Right)     Patient location during evaluation: PACU Anesthesia Type: General Level of consciousness: awake and alert Pain management: pain level controlled Vital Signs Assessment: post-procedure vital signs reviewed and stable Respiratory status: spontaneous breathing, nonlabored ventilation, respiratory function stable and patient connected to nasal cannula oxygen Cardiovascular status: blood pressure returned to baseline and stable Postop Assessment: no apparent nausea or vomiting Anesthetic complications: no   No notable events documented.  Last Vitals:  Vitals:   10/15/23 1116 10/15/23 1130  BP: (!) 138/59 (!) 147/79  Pulse: 80 79  Resp: 19 (!) 8  Temp:    SpO2: 92% 93%    Last Pain:  Vitals:   10/15/23 1215  TempSrc:   PainSc: 7                  Collene Schlichter

## 2023-10-15 NOTE — Assessment & Plan Note (Signed)
Acute on chronic.  Status post 2 units PRBC yesterday, posttransfusion Hg b 7.8, now remaining stable 7.5> 7.7.  Baseline a few months ago was ~10-11.  Iron studies support more anemia of chronic disease picture than IDA give ferritin 39.  Gets Retacrit through nephrology.  Suspect some decrease in setting of fracture, likely further exacerbated by EPO deficit in setting of CKD progression. Follow-up postop hemoglobin, transfuse if Hgb<7 Appreciate Nephrology recs: Already received ESAs Will challenge with IV iron for loading

## 2023-10-15 NOTE — Assessment & Plan Note (Signed)
Mild RUQ tenderness this AM.  Admission CT AP with mild fat stranding about the pancreatic head and body, but lipase WNL, will continue to monitor for evidence of pancreatitis.  Does have gallstone on CT. quite possibly referred pain from right hip fracture. Monitor for improvement after surgery Consider further workup should he develop fever, uptrending WBC, abdominal pain without palpation

## 2023-10-15 NOTE — Procedures (Signed)
Interventional Radiology Procedure Note  Procedure: Placement of a right internal jugular approach 23 cm Palindrome tunneled HD catheter.  Tips in the RA and ready for use.  Complications: None  Estimated Blood Loss: None  Recommendations: - Routine line care   Signed,  Sterling Big, MD

## 2023-10-15 NOTE — Op Note (Signed)
Orthopaedic Surgery Operative Note (CSN: [id number] ) Date of Surgery: 10/15/2023  Admit Date: 10/13/2023   Diagnoses: Pre-Op Diagnoses: Right intertrochanteric femur fracture  Post-Op Diagnosis: Same  Procedures: CPT 27245-Cephalomedullary nailing of right intertrochanteric femur fracture CPT 20650-Placement and removal of traction pin to right distal femur  Surgeons : Primary: Roby Lofts, MD  Assistant: Ulyses Southward, PA-C  Location: OR 3   Anesthesia:General  Antibiotics: Ancef 2g preop with 1 gm vancomycin powder placed topically   Tourniquet time: None    Estimated Blood Loss: 50 mL  Complications:* No complications entered in OR log *   Specimens:* No specimens in log *   Implants: Implant Name Type Inv. Item Serial No. Manufacturer Lot No. LRB No. Used Action  NAIL INTERTAN 10X18 130D 10S - [id number] Nail NAIL INTERTAN 10X18 130D 10S  [employer] [id number] Right 1 Implanted  SCREW LAG COMPR KIT 105/100 - [id number] Screw SCREW LAG COMPR KIT 105/100  [employer] [id number] Right 1 Implanted  SCREW TRIGEN LOW PROF 5.0X37.5 - [id number] Screw SCREW TRIGEN LOW PROF 5.0X37.5  [employer] [id number] Right 1 Implanted     Indications for Surgery: 52 year old male who is a previous history of right BKA had a fall and sustained a right intertrochanteric femur fracture.  Due to the unstable nature of his injury I recommend proceeding with cephalomedullary nailing of his right hip.  Risks and benefits were discussed with the patient.  Risks included but not limited to bleeding, infection, malunion, nonunion, hardware failure, hardware irritation, nerve and blood vessel injury, DVT, even the possibility anesthetic complications.  He agreed to proceed with surgery and consent was obtained.  Operative Findings: 1.  Cephalomedullary nailing of right intertrochanteric femur fracture using [employer] 10 x 180 mm InterTAN with  a 105 mm lag screw and 100 mm compression screw. 2.  Placement of distal femoral traction pin for reduction since the patient had a below-knee amputation.  Procedure: The patient was identified in the preoperative holding area. Consent was confirmed with the patient and their family and all questions were answered. The operative extremity was marked after confirmation with the patient. he was then brought back to the operating room by our anesthesia colleagues.  He was placed under general anesthetic and carefully transferred over to radiolucent flattop table.  A bump was placed under his operative hip.  The right lower extremity was then prepped and draped in usual sterile fashion.  A timeout was performed to verify the patient, the procedure, and the extremity.  Preoperative antibiotics were dosed.  A 2.0 mm K wire was placed from medial to lateral in the distal femur for traction placement.  A traction bow was attached to this and traction was applied by my assistant.  Reduction was obtained.  A small incision proximal to the greater trochanter was made and carried down through skin and subcutaneous tissue.  I directed a threaded guidewire at the tip of the greater trochanter and advanced it into the proximal metaphysis.  I then used an entry reamer to enter the medullary canal.  A 10 x 180 mm nail was then attached to a targeting arm and placed down the center of the canal.  I then used the targeting arm to direct a threaded guidewire up into the head/neck segment.  Adequate tip apex distance was confirmed and I measured a 105 mm lag screw.  I then drilled the path for the compression screw and placed an  antirotation bar.  I then drilled the path for the lag screw and placed a 105 mm lag screw.  I then placed a compression screw and compressed approximately 3 mm.  I then statically locked the proximal portion of the nail.  I then used the targeting arm to place a distal interlocking screw.  Final  fluoroscopic imaging was obtained.  The incisions were irrigated and closed with 2-0 Monocryl and Dermabond.  1 g of vancomycin powder was placed into the incision prior to closure.  The K wire was removed.  Sterile dressings were applied.  The patient was then awoke from anesthesia and taken to the PACU in stable condition.  Post Op Plan/Instructions: The patient be weightbearing as tolerated to the right lower extremity in his prosthesis.  Will have him mobilize with physical and Occupational Therapy.  I would recommend aspirin for DVT prophylaxis.  He will receive postoperative Ancef for surgical prophylaxis.  I was present and performed the entire surgery.  Ulyses Southward, PA-C did assist me throughout the case. An assistant was necessary given the difficulty in approach, maintenance of reduction and ability to instrument the fracture.   Truitt Merle, MD [facility]

## 2023-10-15 NOTE — Anesthesia Procedure Notes (Signed)
Procedure Name: Intubation Date/Time: 10/15/2023 10:06 AM  Performed by: Susy Manor, CRNAPre-anesthesia Checklist: Patient identified, Emergency Drugs available, Suction available and Patient being monitored Patient Re-evaluated:Patient Re-evaluated prior to induction Oxygen Delivery Method: Circle System Utilized Preoxygenation: Pre-oxygenation with 100% oxygen Induction Type: IV induction Ventilation: Mask ventilation without difficulty Laryngoscope Size: Mac and 4 Grade View: Grade III Tube type: Oral Tube size: 7.5 mm Number of attempts: 1 Airway Equipment and Method: Stylet and Oral airway Placement Confirmation: ETT inserted through vocal cords under direct vision, positive ETCO2 and breath sounds checked- equal and bilateral Secured at: 23 cm Tube secured with: Tape Dental Injury: Teeth and Oropharynx as per pre-operative assessment

## 2023-10-15 NOTE — Assessment & Plan Note (Signed)
Secondary to mechanical fall.  No evidence of compartment syndrome per orthopedic surgical eval, surgical fixation tomorrow.  Vitamin D 10.22, contributing to fracture risk.  On calcitriol 0.5 mcg BID. NPO at midnight tomorrow for surgical fixation Pain regimen: Increase hydromorphone frequency to 1 mg Q2h PRN

## 2023-10-15 NOTE — Assessment & Plan Note (Signed)
CKD V at baseline in setting of T2DM and HTN.  Nephrology to see patient this AM and evaluate for HD.  Refer to [facility]VS recommended graft but has not been placed due to patient deferral.  Has been denied transplant in past.  Creatinine at known peak, UOP still normal though retaining urine.  Suspect uremia. Holding [place] for now Avoid nephrotoxic agents Strict I&Os RFP daily Appreciate Nephrology recs: Patient agrees to start HD, will likely start Saturday VVS will place permanent access, IR to place [facility] following hip surgery for now Continue calcitriol and Renvela

## 2023-10-15 NOTE — Plan of Care (Signed)
  Problem: Education: Goal: Ability to describe self-care measures that may prevent or decrease complications (Diabetes Survival Skills Education) will improve Outcome: Progressing   Problem: Coping: Goal: Ability to adjust to condition or change in health will improve Outcome: Progressing   Problem: Fluid Volume: Goal: Ability to maintain a balanced intake and output will improve Outcome: Progressing   Problem: Health Behavior/Discharge Planning: Goal: Ability to identify and utilize available resources and services will improve Outcome: Progressing Goal: Ability to manage health-related needs will improve Outcome: Progressing   Problem: Metabolic: Goal: Ability to maintain appropriate glucose levels will improve Outcome: Progressing   Problem: Nutritional: Goal: Maintenance of adequate nutrition will improve Outcome: Progressing Goal: Progress toward achieving an optimal weight will improve Outcome: Progressing   Problem: Skin Integrity: Goal: Risk for impaired skin integrity will decrease Outcome: Progressing   Problem: Education: Goal: Knowledge of General Education information will improve Description: Including pain rating scale, medication(s)/side effects and non-pharmacologic comfort measures Outcome: Progressing   Problem: Health Behavior/Discharge Planning: Goal: Ability to manage health-related needs will improve Outcome: Progressing   Problem: Clinical Measurements: Goal: Ability to maintain clinical measurements within normal limits will improve Outcome: Progressing Goal: Will remain free from infection Outcome: Progressing Goal: Diagnostic test results will improve Outcome: Progressing Goal: Respiratory complications will improve Outcome: Progressing Goal: Cardiovascular complication will be avoided Outcome: Progressing   Problem: Activity: Goal: Risk for activity intolerance will decrease Outcome: Not Progressing   Problem: Nutrition: Goal:  Adequate nutrition will be maintained Outcome: Progressing   Problem: Coping: Goal: Level of anxiety will decrease Outcome: Progressing   Problem: Elimination: Goal: Will not experience complications related to bowel motility Outcome: Progressing   Problem: Safety: Goal: Ability to remain free from injury will improve Outcome: Progressing   Problem: Skin Integrity: Goal: Risk for impaired skin integrity will decrease Outcome: Progressing

## 2023-10-15 NOTE — Assessment & Plan Note (Signed)
>>  ASSESSMENT AND PLAN FOR T2DM (TYPE 2 DIABETES MELLITUS) (HCC) WRITTEN ON 10/15/2023  8:49 AM BY SHITAREV, DIMITRY, MD  A1c 6.1%.  CBGs in normal range.  Received 1 unit short acting last 24 hours. Holding Farxiga  Holding long-acting insulin , restart based on CBGs CBGs, SSI very sensitive

## 2023-10-15 NOTE — Progress Notes (Signed)
The patient experienced two episodes of urinary retention during this shift at 0000 and 400. Intermittent catheterization was performed on both occasions without complications. If urinary retention persists, a Foley catheter may be required. The patient has been NPO since midnight in preparation for a scheduled procedure this morning

## 2023-10-15 NOTE — Transfer of Care (Signed)
Immediate Anesthesia Transfer of Care Note  Patient: [name] [name]  Procedure(s) Performed: INTRAMEDULLARY (IM) NAIL INTERTROCHANTERIC (Right)  Patient Location: PACU  Anesthesia Type:General  Level of Consciousness: awake, alert , and oriented  Airway & Oxygen Therapy: Patient Spontanous Breathing and Patient connected to nasal cannula oxygen  Post-op Assessment: Report given to RN and Post -op Vital signs reviewed and stable  Post vital signs: Reviewed and stable. [place] applied SPO2 94%  Last Vitals:  Vitals Value Taken Time  BP 138/59 10/15/23 1115  Temp    Pulse 78 10/15/23 1116  Resp 13 10/15/23 1116  SpO2 88 % 10/15/23 1116  Vitals shown include unfiled device data.  Last Pain:  Vitals:   10/15/23 0820  TempSrc:   PainSc: 10-Worst pain ever      Patients Stated Pain Goal: 0 (10/14/23 0815)  Complications: No notable events documented.

## 2023-10-15 NOTE — Anesthesia Preprocedure Evaluation (Addendum)
Anesthesia Evaluation  Patient identified by MRN, date of birth, ID band Patient awake    Reviewed: Allergy & Precautions, NPO status , Patient's Chart, lab work & pertinent test results, reviewed documented beta blocker date and time   Airway Mallampati: III  TM Distance: <3 FB Neck ROM: Full    Dental  (+) Teeth Intact, Dental Advisory Given   Pulmonary Current Smoker and Patient abstained from smoking.   Pulmonary exam normal breath sounds clear to auscultation       Cardiovascular hypertension, Pt. on medications and Pt. on home beta blockers + Peripheral Vascular Disease  Normal cardiovascular exam Rhythm:Regular Rate:Normal     Neuro/Psych  PSYCHIATRIC DISORDERS  Depression    negative neurological ROS     GI/Hepatic Neg liver ROS,GERD  ,,  Endo/Other  diabetes, Type 2, Oral Hypoglycemic Agents, Insulin Dependent  Class 3 obesity  Renal/GU Renal Insufficiency and CRFRenal disease     Musculoskeletal  (+) Arthritis ,  right hip fracture   Abdominal   Peds  Hematology  (+) Blood dyscrasia, anemia   Anesthesia Other Findings Day of surgery medications reviewed with the patient.  Reproductive/Obstetrics                             Anesthesia Physical Anesthesia Plan  ASA: 4  Anesthesia Plan: General   Post-op Pain Management: Ofirmev IV (intra-op)*   Induction: Intravenous  PONV Risk Score and Plan: 1 and Midazolam, Dexamethasone and Ondansetron  Airway Management Planned: Oral ETT  Additional Equipment:   Intra-op Plan:   Post-operative Plan: Extubation in OR  Informed Consent: I have reviewed the patients History and Physical, chart, labs and discussed the procedure including the risks, benefits and alternatives for the proposed anesthesia with the patient or authorized representative who has indicated his/her understanding and acceptance.     Dental advisory  given  Plan Discussed with: CRNA  Anesthesia Plan Comments:        Anesthesia Quick Evaluation

## 2023-10-15 NOTE — Plan of Care (Signed)
MD to bedside to see patient after surgery and TDC placement. Reports pain is controlled. Awake and alert and conversant.  Mildly drowsy.  Vital stable.  Extremities warm well-perfused.  Normal postoperative care.  Remainder of plan per progress note from today.

## 2023-10-15 NOTE — Progress Notes (Signed)
[facility] NEPHROLOGY PROGRESS NOTE  Assessment/ Plan: Pt is a 52 y.o. yo male with a history of DM 2, HTN, urinary retention, PAD (status post right BKA), HLD CKD5, presented after a fall and hip fracture.  # Fall/right intertrochanteric femur fracture status post ORIF/orthopedic surgery today.  Further management per the surgeon.  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  Agreed to start dialysis.  Status post right IJ TDC by IR on 11/22.  [facility] is following for permanent access.  Plan for first dialysis tomorrow.  I will contact renal navigator to arrange outpatient dialysis.  # Anemia of CKD: Hemoglobin low with iron saturation of 8%.  Receiving IV iron.  I will dose erythropoietin.  # HTN/volume: Continue current antihypertensives and pain management.  UF with HD.  Monitor BP.  # CKD-MBD/hyperphosphatemia: Continue sevelamer, check PTH level.  Phosphorus level expect to improve after dialysis.  Discussed with the patient, his sister and mother were present at the bedside.  Subjective: Seen and examined at the bedside.  Patient had orthopedic surgery and an IR procedure for right HD catheter placement today.  He reports tired and pain at the surgery site.  He is still under the influence of anesthesia and pain medication. Objective Vital signs in last 24 hours: Vitals:   10/15/23 1500 10/15/23 1505 10/15/23 1510 10/15/23 1537  BP: (!) 161/90 (!) 164/98 (!) 168/84 (!) 165/83  Pulse: 88 93 92 90  Resp: 14 14 20 15   Temp:    98.2 F (36.8 C)  TempSrc:    Oral  SpO2: 98% 98% 98% 96%  Weight:      Height:       Weight change:   Intake/Output Summary (Last 24 hours) at 10/15/2023 1636 Last data filed at 10/15/2023 1245 Gross per 24 hour  Intake 150 ml  Output 2757 ml  Net -2607 ml       Labs: RENAL PANEL Recent Labs  Lab 10/13/23 1948 10/14/23 0324 10/14/23 0325 10/14/23 1616 10/15/23 0733  NA 140 139  --   --  136  K 4.9 4.9   --   --  5.1  CL 114* 113*  --   --  110  CO2 17* 17*  --   --  16*  GLUCOSE 216* 120*  --   --  140*  BUN 78* 76*  --   --  81*  CREATININE 9.53* 10.16*  --   --  10.40*  CALCIUM 7.8* 7.5*  --   --  7.6*  PHOS  --  6.3*  --  5.9* 6.6*  ALBUMIN  --  2.4* 2.3*  --  2.2*    Liver Function Tests: Recent Labs  Lab 10/14/23 0324 10/14/23 0325 10/15/23 0733  AST  --  8*  --   ALT  --  10  --   ALKPHOS  --  50  --   BILITOT  --  0.6  --   PROT  --  5.2*  --   ALBUMIN 2.4* 2.3* 2.2*   Recent Labs  Lab 10/13/23 2008  LIPASE 50   No results for input(s): "AMMONIA" in the last 168 hours. CBC: Recent Labs    07/01/23 1223 07/01/23 1228 07/29/23 1155 07/29/23 1203 08/26/23 1253 08/26/23 1300 10/07/23 1217 10/07/23 1219 10/13/23 1948 10/14/23 0325 10/14/23 1348 10/14/23 1920 10/15/23 0733  HGB  --    < >  --    < >  --    < >  --    < >  7.0* 6.3* 7.8* 7.5* 7.7*  MCV  --   --   --   --   --   --   --   --  93.6 92.6  --  92.3 91.1  FERRITIN 14*  --  17*  --  24  --  50  --   --  39  --   --   --   TIBC 349  --  354  --  305  --  260  --   --  225*  --   --   --   IRON 76  --  91  --  126  --  79  --   --  19*  --   --   --    < > = values in this interval not displayed.    Cardiac Enzymes: No results for input(s): "CKTOTAL", "CKMB", "CKMBINDEX", "TROPONINI" in the last 168 hours. CBG: Recent Labs  Lab 10/15/23 0023 10/15/23 0422 10/15/23 0801 10/15/23 1116 10/15/23 1536  GLUCAP 124* 117* 139* 146* 185*    Iron Studies:  Recent Labs    10/14/23 0325  IRON 19*  TIBC 225*  FERRITIN 39   Studies/Results: IR Fluoro Guide CV Line Right  Result Date: 10/15/2023 INDICATION: 52 year old male in need of hemodialysis. He presents for tunneled dialysis catheter placement. EXAM: TUNNELED CENTRAL VENOUS HEMODIALYSIS CATHETER PLACEMENT WITH ULTRASOUND AND FLUOROSCOPIC GUIDANCE MEDICATIONS: Intravenous Ancef was administered earlier today as an inpatient.  ANESTHESIA/SEDATION: Moderate (conscious) sedation was employed during this procedure. A total of Versed 1 mg and Fentanyl 50 mcg was administered intravenously by the Radiology nurse. Moderate Sedation Time: 12 minutes. The patient's level of consciousness and vital signs were monitored continuously by radiology nursing throughout the procedure under my direct supervision. FLUOROSCOPY TIME:  Radiation exposure index: 2 mGy reference air kerma COMPLICATIONS: None immediate. PROCEDURE: Informed written consent was obtained from the patient after a discussion of the risks, benefits, and alternatives to treatment. Questions regarding the procedure were encouraged and answered. The right neck and chest were prepped with chlorhexidine in a sterile fashion, and a sterile drape was applied covering the operative field. Maximum barrier sterile technique with sterile gowns and gloves were used for the procedure. A timeout was performed prior to the initiation of the procedure. After creating a small venotomy incision, a micropuncture kit was utilized to access the right internal jugular vein under direct, real-time ultrasound guidance after the overlying soft tissues were anesthetized with 1% lidocaine with epinephrine. Ultrasound image documentation was performed. The microwire was kinked to measure appropriate catheter length. A stiff Glidewire was advanced to the level of the IVC and the micropuncture sheath was exchanged for a peel-away sheath. A Palindrome tunneled hemodialysis catheter measuring 23 cm from tip to cuff was tunneled in a retrograde fashion from the anterior chest wall to the venotomy incision. The catheter was then placed through the peel-away sheath with tips ultimately positioned within the superior aspect of the right atrium. Final catheter positioning was confirmed and documented with a spot radiographic image. The catheter aspirates and flushes normally. The catheter was flushed with appropriate  volume heparin dwells. The catheter exit site was secured with a 0-Prolene retention suture. The venotomy incision was closed with an interrupted 4-0 Vicryl, Dermabond and Steri-strips. Dressings were applied. The patient tolerated the procedure well without immediate post procedural complication. IMPRESSION: Successful placement of 23 cm tip to cuff tunneled hemodialysis catheter via the right internal jugular vein with tips  terminating within the superior aspect of the right atrium. The catheter is ready for immediate use. Electronically Signed   By: Malachy Moan M.D.   On: 10/15/2023 15:39   IR US Guide Vasc Access Right  Result Date: 10/15/2023 INDICATION: 52 year old male in need of hemodialysis. He presents for tunneled dialysis catheter placement. EXAM: TUNNELED CENTRAL VENOUS HEMODIALYSIS CATHETER PLACEMENT WITH ULTRASOUND AND FLUOROSCOPIC GUIDANCE MEDICATIONS: Intravenous Ancef was administered earlier today as an inpatient. ANESTHESIA/SEDATION: Moderate (conscious) sedation was employed during this procedure. A total of Versed 1 mg and Fentanyl 50 mcg was administered intravenously by the Radiology nurse. Moderate Sedation Time: 12 minutes. The patient's level of consciousness and vital signs were monitored continuously by radiology nursing throughout the procedure under my direct supervision. FLUOROSCOPY TIME:  Radiation exposure index: 2 mGy reference air kerma COMPLICATIONS: None immediate. PROCEDURE: Informed written consent was obtained from the patient after a discussion of the risks, benefits, and alternatives to treatment. Questions regarding the procedure were encouraged and answered. The right neck and chest were prepped with chlorhexidine in a sterile fashion, and a sterile drape was applied covering the operative field. Maximum barrier sterile technique with sterile gowns and gloves were used for the procedure. A timeout was performed prior to the initiation of the procedure. After  creating a small venotomy incision, a micropuncture kit was utilized to access the right internal jugular vein under direct, real-time ultrasound guidance after the overlying soft tissues were anesthetized with 1% lidocaine with epinephrine. Ultrasound image documentation was performed. The microwire was kinked to measure appropriate catheter length. A stiff Glidewire was advanced to the level of the IVC and the micropuncture sheath was exchanged for a peel-away sheath. A Palindrome tunneled hemodialysis catheter measuring 23 cm from tip to cuff was tunneled in a retrograde fashion from the anterior chest wall to the venotomy incision. The catheter was then placed through the peel-away sheath with tips ultimately positioned within the superior aspect of the right atrium. Final catheter positioning was confirmed and documented with a spot radiographic image. The catheter aspirates and flushes normally. The catheter was flushed with appropriate volume heparin dwells. The catheter exit site was secured with a 0-Prolene retention suture. The venotomy incision was closed with an interrupted 4-0 Vicryl, Dermabond and Steri-strips. Dressings were applied. The patient tolerated the procedure well without immediate post procedural complication. IMPRESSION: Successful placement of 23 cm tip to cuff tunneled hemodialysis catheter via the right internal jugular vein with tips terminating within the superior aspect of the right atrium. The catheter is ready for immediate use. Electronically Signed   By: Malachy Moan M.D.   On: 10/15/2023 15:39   DG HIP UNILAT WITH PELVIS 2-3 VIEWS RIGHT  Result Date: 10/15/2023 CLINICAL DATA:  Elective surgery. EXAM: DG HIP (WITH OR WITHOUT PELVIS) 2-3V RIGHT COMPARISON:  Preoperative imaging. FINDINGS: Five fluoroscopic spot views of the right hip obtained in the operating room. Femoral intramedullary nail with trans trochanteric and distal locking screw fixation of proximal femur  fracture. Fluoroscopy time 1 minutes 18 seconds. Dose 17.02 mGy. IMPRESSION: Intraoperative fluoroscopy during proximal femur fracture fixation. Electronically Signed   By: Narda Rutherford M.D.   On: 10/15/2023 11:27   DG C-Arm 1-60 Min-No Report  Result Date: 10/15/2023 Fluoroscopy was utilized by the requesting physician.  No radiographic interpretation.   CT HEAD WO CONTRAST ( )  Result Date: 10/14/2023 CLINICAL DATA:  Ataxia, head trauma Frequent falls, head trauma 1 week ago EXAM: CT HEAD WITHOUT CONTRAST TECHNIQUE: Contiguous  axial images were obtained from the base of the skull through the vertex without intravenous contrast. RADIATION DOSE REDUCTION: This exam was performed according to the departmental dose-optimization program which includes automated exposure control, adjustment of the mA and/or kV according to patient size and/or use of iterative reconstruction technique. COMPARISON:  None Available. FINDINGS: Limitations: Motion degraded exam Brain: No hemorrhage. No hydrocephalus. No extra-axial fluid collection. No CT evidence of an acute cortical infarct. No mass effect. No mass lesion. Vascular: No hyperdense vessel or unexpected calcification. Skull: Normal. Negative for fracture or focal lesion. Sinuses/Orbits: No middle ear or mastoid effusion. Paranasal sinuses are notable for mild mucosal thickening of the floor of bilateral maxillary sinuses. Orbits are unremarkable. Other: None. IMPRESSION: No CT evidence of intracranial injury. Electronically Signed   By: Lorenza Cambridge M.D.   On: 10/14/2023 08:17   CT L-SPINE NO CHARGE  Result Date: 10/13/2023 CLINICAL DATA:  Pain after fall. EXAM: CT LUMBAR SPINE WITHOUT CONTRAST TECHNIQUE: Multidetector CT imaging of the lumbar spine was performed without intravenous contrast administration. Multiplanar CT image reconstructions were also generated. RADIATION DOSE REDUCTION: This exam was performed according to the departmental  dose-optimization program which includes automated exposure control, adjustment of the mA and/or kV according to patient size and/or use of iterative reconstruction technique. COMPARISON:  None Available. FINDINGS: Segmentation: 5 lumbar type vertebrae. Alignment: Normal. Vertebrae: No evidence of acute fracture. Schmorl's node involving superior endplate of L1 and L2. The posterior elements are intact. Paraspinal and other soft tissues: Assessed on concurrent abdominopelvic CT, reported separately. Disc levels: Mild facet hypertrophy and broad-based disc bulge at multiple levels. IMPRESSION: 1. No acute fracture or subluxation of the lumbar spine. 2. Mild degenerative disc disease and facet hypertrophy. Electronically Signed   By: Narda Rutherford M.D.   On: 10/13/2023 20:23   CT ABDOMEN PELVIS WO CONTRAST  Result Date: 10/13/2023 CLINICAL DATA:  Abdominal pain, fall.  Sacral pain. EXAM: CT ABDOMEN AND PELVIS WITHOUT CONTRAST TECHNIQUE: Multidetector CT imaging of the abdomen and pelvis was performed following the standard protocol without IV contrast. RADIATION DOSE REDUCTION: This exam was performed according to the departmental dose-optimization program which includes automated exposure control, adjustment of the mA and/or kV according to patient size and/or use of iterative reconstruction technique. COMPARISON:  Noncontrast CT 09/02/2021 FINDINGS: Lower chest: Small left pleural effusion demonstrates simple fluid density. There is also a trace right pleural effusion. Hepatobiliary: Assessment for injury is limited in the absence of IV contrast. No evidence of focal hepatic abnormality or perihepatic hematoma. Subtle capsular nodularity of the liver. Multiple gallstones without abnormal gallbladder distension. Pancreas: Mild fat stranding about the pancreatic head and body. No ductal dilatation. Spleen: Unremarkable unenhanced appearance. Adrenals/Urinary Tract: Chronic left adrenal thickening. No evidence  of adrenal hemorrhage. Bilateral renal parenchymal atrophy. Symmetric perinephric fat stranding. No renal calculi. Unremarkable urinary bladder. Stomach/Bowel: Moderate gastric distention with fluid. Mild wall thickening about the duodenum adjacent to pancreatic stranding. No small bowel obstruction or obvious small bowel wall thickening. Colonic diverticulosis without diverticulitis. Normal appendix. Vascular/Lymphatic: Aortic atherosclerosis. No periaortic stranding or retroperitoneal fluid to suggest injury. No abdominopelvic adenopathy. Reproductive: Unremarkable. Other: Generalized body wall and subcutaneous edema. Mild edema of the mesenteric fat, more prominent taste in the pancreas. Small fat containing inguinal hernias. Musculoskeletal: Comminuted intertrochanteric right proximal femur fracture with mild displacement. No additional fracture of the pelvis. No sacral fracture. Lumbar spine assessed on concurrent lumbar spine reformats, reported separately. No fracture of included ribs. IMPRESSION: 1.  Comminuted intertrochanteric right proximal femur fracture with mild displacement. 2. No additional traumatic injury in the abdomen/pelvis. 3. Mild fat stranding about the pancreatic head and body. There is also generalized edema of the mesenteric and subcutaneous fat. Recommend correlation with pancreatic enzymes for pancreatitis. 4. Cholelithiasis. Colonic diverticulosis without diverticulitis. 5. Small left and trace right pleural effusions. 6. Subtle capsular nodularity of the liver can be seen with cirrhosis. Recommend correlation for cirrhosis risk factors. Aortic Atherosclerosis (ICD10-I70.0). Electronically Signed   By: Narda Rutherford M.D.   On: 10/13/2023 19:58   DG Femur Min 2 Views Right  Result Date: 10/13/2023 CLINICAL DATA:  Status post fall with right leg pain. EXAM: RIGHT FEMUR 2 VIEWS; PELVIS - 1-2 VIEW COMPARISON:  None Available. FINDINGS: Femur: Displaced intertrochanteric femur  fracture. Distal femur is intact. Knee alignment is maintained. Pelvis: No additional fracture of the pelvis. No hip dislocation. Mild bilateral hip osteoarthritis. No pubic symphyseal or sacroiliac diastasis. IMPRESSION: Displaced intertrochanteric femur fracture. Electronically Signed   By: Narda Rutherford M.D.   On: 10/13/2023 19:20   DG Pelvis 1-2 Views  Result Date: 10/13/2023 CLINICAL DATA:  Status post fall with right leg pain. EXAM: RIGHT FEMUR 2 VIEWS; PELVIS - 1-2 VIEW COMPARISON:  None Available. FINDINGS: Femur: Displaced intertrochanteric femur fracture. Distal femur is intact. Knee alignment is maintained. Pelvis: No additional fracture of the pelvis. No hip dislocation. Mild bilateral hip osteoarthritis. No pubic symphyseal or sacroiliac diastasis. IMPRESSION: Displaced intertrochanteric femur fracture. Electronically Signed   By: Narda Rutherford M.D.   On: 10/13/2023 19:20    Medications: Infusions:  [START ON 10/16/2023] iron sucrose      Scheduled Medications:  acetaminophen  650 mg Oral Q6H   amLODipine  5 mg Oral Daily   atorvastatin  40 mg Oral QHS   calcitRIOL  0.5 mcg Oral Daily   carvedilol  12.5 mg Oral BID WC   diltiazem  300 mg Oral Daily   FLUoxetine  10 mg Oral Daily   gabapentin  300 mg Oral Daily   hydrALAZINE  100 mg Oral BID   influenza vac split trivalent PF  0.5 mL Intramuscular Tomorrow-1000   insulin aspart  0-6 Units Subcutaneous Q4H   mupirocin ointment  1 Application Nasal BID   sevelamer carbonate  800 mg Oral TID WC    have reviewed scheduled and prn medications.  Physical Exam: General: Alert awake but sluggish, able to lie flat. Heart:RRR, s1s2 nl Lungs:clear b/l, no crackle Abdomen:soft, Non-tender, non-distended Extremities:No edema, right BKA Dialysis Access: Right IJ TDC in place, site clean  Ethelyn Cerniglia Prasad Yong Wahlquist 10/15/2023,4:36 PM  LOS: 2 days

## 2023-10-15 NOTE — Consult Note (Signed)
Chief Complaint: ESRD. Request is for tunneled HD catheter placement.   Referring Physician(s): Dr. Paulene Floor  Supervising Physician: Malachy Moan  Patient Status: [facility] - In-pt  History of Present Illness: [name] [name] is a 52 y.o. male  inpatient. History of PAD, HTN. HTN, HLD, GERD, anemia on Retacrit, SM, neuropathy ( s/p rights BKA) CKD. Presented to the ED at [facility] on 11.20.24 with injuries related to a fall from standing. Found to have a right proximal femur fracture and uremic. Team is requesting a tunneled HD catheter for hemodialysis access.   Patient alert and sitting up bed,calm. Awaiting fixation surgery of right femur. Endorses right hip pain. Denies any fevers, headache, chest pain, SOB, cough, abdominal pain, nausea, vomiting or bleeding.   BUN 81 Cr 10.40, GFR < 6, , Albumin 2.2. All other labs and medications are within acceptable parameters. Allergies include tape. Patient has been NPO since midnight/  Return precautions and treatment recommendations and follow-up discussed with the patient  who is agreeable with the plan.    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)    Depression    Diabetes mellitus without complication (HCC) 2019   GERD (gastroesophageal reflux disease)    Hyperlipidemia    Hypertension    Necrotizing fasciitis (HCC) 10/13/2017   Neuromuscular disorder (HCC)    neuropathy   PAD (peripheral artery disease) (HCC)    Secondary hyperparathyroidism (HCC)    Wound dehiscence 11/24/2017    Past Surgical History:  Procedure Laterality Date   AMPUTATION Right 10/13/2017   Procedure: RIGHT BELOW KNEE AMPUTATION;  Surgeon: Nadara Mustard, MD;  Location: [facility] OR;  Service: Orthopedics;  Laterality: Right;   AMPUTATION Right 11/24/2017   Procedure:  AMPUTATION BELOW KNEE REVISION;  Surgeon: Nadara Mustard, MD;  Location: [facility] OR;  Service: Orthopedics;  Laterality: Right;   AMPUTATION TOE Left    BIOPSY  09/02/2021   Procedure: BIOPSY;  Surgeon: Imogene Burn, MD;  Location: [facility] ENDOSCOPY;  Service: Gastroenterology;;   BIOPSY  02/18/2023   Procedure: BIOPSY;  Surgeon: Benancio Deeds, MD;  Location: [facility] ENDOSCOPY;  Service: Gastroenterology;;   COLONOSCOPY  05/11/2019   ESOPHAGOGASTRODUODENOSCOPY (EGD) WITH PROPOFOL N/A 09/02/2021   Procedure: ESOPHAGOGASTRODUODENOSCOPY (EGD) WITH PROPOFOL;  Surgeon: Imogene Burn, MD;  Location: [facility] ENDOSCOPY;  Service: Gastroenterology;  Laterality: N/A;   ESOPHAGOGASTRODUODENOSCOPY (EGD) WITH PROPOFOL N/A 02/18/2023   Procedure: ESOPHAGOGASTRODUODENOSCOPY (EGD) WITH PROPOFOL;  Surgeon: Benancio Deeds, MD;  Location: [facility] ENDOSCOPY;  Service: Gastroenterology;  Laterality: N/A;   NO PAST SURGERIES     POLYPECTOMY     WISDOM TOOTH EXTRACTION      Allergies: Iron and Tape  Medications: Prior to Admission medications   Medication Sig Start Date End Date Taking? Authorizing Provider  amLODipine (NORVASC) 5 MG tablet TAKE 1 TABLET(5 MG) BY MOUTH AT BEDTIME Patient taking differently: Take 5 mg by mouth daily. 10/12/23  Yes [name], MD  atorvastatin (LIPITOR) 40 MG tablet Take 1 tablet (40 mg total) by mouth at bedtime. Patient taking differently: Take 40 mg by mouth daily. 12/14/22  Yes [name], MD  baclofen (LIORESAL) 10 MG tablet TAKE 1/2 TABLET BY MOUTH TWICE DAILY Patient taking differently: Take 10 mg by mouth daily. 10/04/23  Yes Jagadish,  Mayuri, MD  calcitRIOL (ROCALTROL) 0.5 MCG capsule Take 0.5 mcg by mouth in the morning and at bedtime. 09/08/22  Yes [provider]  carvedilol (COREG) 12.5 MG tablet Take 12.5 mg by mouth 2 (two) times daily with a meal.   Yes [provider]  diltiazem (CARDIZEM CD) 300 MG 24 hr capsule TAKE 1 CAPSULE(300 MG) BY  MOUTH AT BEDTIME 10/21/22  Yes Ursuy, Renee Lynn, PA-C  FARXIGA 10 MG TABS tablet TAKE 1 TABLET BY MOUTH EVERY DAY 09/15/23  Yes [name], MD  FLUoxetine (PROZAC) 10 MG capsule TAKE 1 CAPSULE(10 MG) BY MOUTH DAILY Patient taking differently: Take 10 mg by mouth daily. 10/04/23  Yes [name], MD  furosemide (LASIX) 40 MG tablet Take 40 mg by mouth daily.   Yes [provider]  gabapentin (NEURONTIN) 300 MG capsule TAKE 1 CAPSULE(300 MG) BY MOUTH DAILY 05/26/23  Yes [name], MD  HUMALOG KWIKPEN 100 UNIT/ML KwikPen Inject 6 Units into the skin 2 (two) times daily before lunch and supper. Dose per sliding scale 02/25/23  Yes McDiarmid, Leighton Roach, MD  hydrALAZINE (APRESOLINE) 50 MG tablet TAKE 1 AND 1/2 TABLETS(75 MG) BY MOUTH THREE TIMES DAILY Patient taking differently: Take 100 mg by mouth in the morning and at bedtime. 07/06/23  Yes Alicia Amel, MD  Insulin Disposable Pump (OMNIPOD DASH PODS, GEN 4,) MISC  04/15/22  Yes [provider]  metoprolol succinate (TOPROL XL) 50 MG 24 hr tablet TAKE 100 MG IN THE AM AND TAKE 50 MG IN THE PM Patient taking differently: Take 50 mg by mouth in the morning and at bedtime. 07/19/23  Yes Sheilah Pigeon, PA-C  ondansetron (ZOFRAN-ODT) 8 MG disintegrating tablet DISSOLVE 1 TABLET(8 MG) ON THE TONGUE EVERY 8 HOURS AS NEEDED FOR NAUSEA OR VOMITING 08/12/23  Yes [name], MD  sevelamer carbonate (RENVELA) 800 MG tablet Take 2,400 mg by mouth in the morning and at bedtime.   Yes [provider]  Lawrence Marseilles LANCETS MISC Check blood sugars before meals twice daily 11/19/17   Julieanne Manson, MD  Blood Glucose Monitoring Suppl (ACCU-CHEK GUIDE ME) w/Device KIT See admin instructions. 02/23/23   [provider]  glucose blood (AGAMATRIX PRESTO TEST) test strip Check blood sugars twice daily before meals 02/02/18   Julieanne Manson, MD  glucose blood test strip 1 each by Other route as needed  for other. Use as instructed    [provider]     Family History  Problem Relation Age of Onset   Diabetes Mellitus II Mother    Depression Mother    Colon polyps Mother    Hypertension Mother    High Cholesterol Mother    Heart disease Father        CABG x 4.  04/2017   Diabetes Father    High Cholesterol Father    Hypertension Father    Heart disease Maternal Grandmother    Heart disease Maternal Grandfather    Colon cancer Neg Hx    Esophageal cancer Neg Hx    Liver cancer Neg Hx    Pancreatic cancer Neg Hx    Rectal cancer Neg Hx    Stomach cancer Neg Hx     Social History   Socioeconomic History   Marital status: Divorced    Spouse name: Not on file   Number of children: 1   Years of education: 12   Highest education level: 12th grade  Occupational History   Occupation:  unemployed  Tobacco Use   Smoking status: Every Day    Current packs/day: 0.50    Average packs/day: 0.5 packs/day for 35.4 years (17.7 ttl pk-yrs)    Types: Cigarettes    Start date: 11/23/1988   Smokeless tobacco: Never   Tobacco comments:    Uninterested - 0/10  importance of quitting 02/2023  Vaping Use   Vaping status: Never Used  Substance and Sexual Activity   Alcohol use: No   Drug use: No   Sexual activity: Not Currently  Other Topics Concern   Not on file  Social History Narrative   Originally from  [place].   Is living with his mother, who essentially supports him now.   Social Determinants of Health   Financial Resource Strain: High Risk (08/26/2023)   Overall Financial Resource Strain (CARDIA)    Difficulty of Paying Living Expenses: Hard  Food Insecurity: No Food Insecurity (10/14/2023)   Hunger Vital Sign    Worried About Running Out of Food in the Last Year: Never true    Ran Out of Food in the Last Year: Never true  Transportation Needs: No Transportation Needs (10/14/2023)   PRAPARE - Administrator, Civil Service (Medical): No    Lack of  Transportation (Non-Medical): No  Physical Activity: Inactive (06/08/2023)   Exercise Vital Sign    Days of Exercise per Week: 0 days    Minutes of Exercise per Session: 0 min  Stress: Stress Concern Present (08/26/2023)   [employer] of Occupational Health - Occupational Stress Questionnaire    Feeling of Stress : Very much  Social Connections: Socially Isolated (06/08/2023)   Social Connection and Isolation Panel [NHANES]    Frequency of Communication with Friends and Family: Once a week    Frequency of Social Gatherings with Friends and Family: Never    Attends Religious Services: Never    Database administrator or Organizations: No    Attends Engineer, structural: Never    Marital Status: Divorced    Review of Systems: A 12 point ROS discussed and pertinent positives are indicated in the HPI above.  All other systems are negative.  Review of Systems  Constitutional:  Negative for fever.  HENT:  Negative for congestion.   Respiratory:  Negative for cough and shortness of breath.   Cardiovascular:  Negative for chest pain.  Gastrointestinal:  Negative for abdominal pain.  Musculoskeletal:  Positive for arthralgias (right hip pain).  Neurological:  Negative for headaches.  Psychiatric/Behavioral:  Negative for behavioral problems and confusion.     Vital Signs: BP (!) 147/79 (BP Location: Left Arm)   Pulse 79   Temp 98.2 F (36.8 C)   Resp (!) 8   Ht 6\' 5"  (1.956 m)   Wt 257 lb 15 oz (117 kg)   SpO2 93%   BMI 30.59 kg/m     Physical Exam Vitals and nursing note reviewed.  Constitutional:      Appearance: He is well-developed.  HENT:     Head: Normocephalic.  Cardiovascular:     Rate and Rhythm: Normal rate and regular rhythm.  Pulmonary:     Effort: Pulmonary effort is normal.     Breath sounds: Normal breath sounds.  Musculoskeletal:        General: Normal range of motion.     Cervical back: Normal range of motion.     Comments: Right BKA   Skin:    General: Skin is warm and dry.  Neurological:     General: No focal deficit present.     Mental Status: He is alert and oriented to person, place, and time. Mental status is at baseline.  Psychiatric:        Mood and Affect: Mood normal.        Behavior: Behavior normal.        Thought Content: Thought content normal.        Judgment: Judgment normal.     Imaging: DG HIP UNILAT WITH PELVIS 2-3 VIEWS RIGHT  Result Date: 10/15/2023 CLINICAL DATA:  Elective surgery. EXAM: DG HIP (WITH OR WITHOUT PELVIS) 2-3V RIGHT COMPARISON:  Preoperative imaging. FINDINGS: Five fluoroscopic spot views of the right hip obtained in the operating room. Femoral intramedullary nail with trans trochanteric and distal locking screw fixation of proximal femur fracture. Fluoroscopy time 1 minutes 18 seconds. Dose 17.02 mGy. IMPRESSION: Intraoperative fluoroscopy during proximal femur fracture fixation. Electronically Signed   By: Narda Rutherford M.D.   On: 10/15/2023 11:27   DG C-Arm 1-60 Min-No Report  Result Date: 10/15/2023 Fluoroscopy was utilized by the requesting physician.  No radiographic interpretation.   CT HEAD WO CONTRAST ( )  Result Date: 10/14/2023 CLINICAL DATA:  Ataxia, head trauma Frequent falls, head trauma 1 week ago EXAM: CT HEAD WITHOUT CONTRAST TECHNIQUE: Contiguous axial images were obtained from the base of the skull through the vertex without intravenous contrast. RADIATION DOSE REDUCTION: This exam was performed according to the departmental dose-optimization program which includes automated exposure control, adjustment of the mA and/or kV according to patient size and/or use of iterative reconstruction technique. COMPARISON:  None Available. FINDINGS: Limitations: Motion degraded exam Brain: No hemorrhage. No hydrocephalus. No extra-axial fluid collection. No CT evidence of an acute cortical infarct. No mass effect. No mass lesion. Vascular: No hyperdense vessel or unexpected  calcification. Skull: Normal. Negative for fracture or focal lesion. Sinuses/Orbits: No middle ear or mastoid effusion. Paranasal sinuses are notable for mild mucosal thickening of the floor of bilateral maxillary sinuses. Orbits are unremarkable. Other: None. IMPRESSION: No CT evidence of intracranial injury. Electronically Signed   By: Lorenza Cambridge M.D.   On: 10/14/2023 08:17   CT L-SPINE NO CHARGE  Result Date: 10/13/2023 CLINICAL DATA:  Pain after fall. EXAM: CT LUMBAR SPINE WITHOUT CONTRAST TECHNIQUE: Multidetector CT imaging of the lumbar spine was performed without intravenous contrast administration. Multiplanar CT image reconstructions were also generated. RADIATION DOSE REDUCTION: This exam was performed according to the departmental dose-optimization program which includes automated exposure control, adjustment of the mA and/or kV according to patient size and/or use of iterative reconstruction technique. COMPARISON:  None Available. FINDINGS: Segmentation: 5 lumbar type vertebrae. Alignment: Normal. Vertebrae: No evidence of acute fracture. Schmorl's node involving superior endplate of L1 and L2. The posterior elements are intact. Paraspinal and other soft tissues: Assessed on concurrent abdominopelvic CT, reported separately. Disc levels: Mild facet hypertrophy and broad-based disc bulge at multiple levels. IMPRESSION: 1. No acute fracture or subluxation of the lumbar spine. 2. Mild degenerative disc disease and facet hypertrophy. Electronically Signed   By: Narda Rutherford M.D.   On: 10/13/2023 20:23   CT ABDOMEN PELVIS WO CONTRAST  Result Date: 10/13/2023 CLINICAL DATA:  Abdominal pain, fall.  Sacral pain. EXAM: CT ABDOMEN AND PELVIS WITHOUT CONTRAST TECHNIQUE: Multidetector CT imaging of the abdomen and pelvis was performed following the standard protocol without IV contrast. RADIATION DOSE REDUCTION: This exam was performed according to the departmental dose-optimization program which  includes automated exposure  control, adjustment of the mA and/or kV according to patient size and/or use of iterative reconstruction technique. COMPARISON:  Noncontrast CT 09/02/2021 FINDINGS: Lower chest: Small left pleural effusion demonstrates simple fluid density. There is also a trace right pleural effusion. Hepatobiliary: Assessment for injury is limited in the absence of IV contrast. No evidence of focal hepatic abnormality or perihepatic hematoma. Subtle capsular nodularity of the liver. Multiple gallstones without abnormal gallbladder distension. Pancreas: Mild fat stranding about the pancreatic head and body. No ductal dilatation. Spleen: Unremarkable unenhanced appearance. Adrenals/Urinary Tract: Chronic left adrenal thickening. No evidence of adrenal hemorrhage. Bilateral renal parenchymal atrophy. Symmetric perinephric fat stranding. No renal calculi. Unremarkable urinary bladder. Stomach/Bowel: Moderate gastric distention with fluid. Mild wall thickening about the duodenum adjacent to pancreatic stranding. No small bowel obstruction or obvious small bowel wall thickening. Colonic diverticulosis without diverticulitis. Normal appendix. Vascular/Lymphatic: Aortic atherosclerosis. No periaortic stranding or retroperitoneal fluid to suggest injury. No abdominopelvic adenopathy. Reproductive: Unremarkable. Other: Generalized body wall and subcutaneous edema. Mild edema of the mesenteric fat, more prominent taste in the pancreas. Small fat containing inguinal hernias. Musculoskeletal: Comminuted intertrochanteric right proximal femur fracture with mild displacement. No additional fracture of the pelvis. No sacral fracture. Lumbar spine assessed on concurrent lumbar spine reformats, reported separately. No fracture of included ribs. IMPRESSION: 1. Comminuted intertrochanteric right proximal femur fracture with mild displacement. 2. No additional traumatic injury in the abdomen/pelvis. 3. Mild fat stranding  about the pancreatic head and body. There is also generalized edema of the mesenteric and subcutaneous fat. Recommend correlation with pancreatic enzymes for pancreatitis. 4. Cholelithiasis. Colonic diverticulosis without diverticulitis. 5. Small left and trace right pleural effusions. 6. Subtle capsular nodularity of the liver can be seen with cirrhosis. Recommend correlation for cirrhosis risk factors. Aortic Atherosclerosis (ICD10-I70.0). Electronically Signed   By: Narda Rutherford M.D.   On: 10/13/2023 19:58   DG Femur Min 2 Views Right  Result Date: 10/13/2023 CLINICAL DATA:  Status post fall with right leg pain. EXAM: RIGHT FEMUR 2 VIEWS; PELVIS - 1-2 VIEW COMPARISON:  None Available. FINDINGS: Femur: Displaced intertrochanteric femur fracture. Distal femur is intact. Knee alignment is maintained. Pelvis: No additional fracture of the pelvis. No hip dislocation. Mild bilateral hip osteoarthritis. No pubic symphyseal or sacroiliac diastasis. IMPRESSION: Displaced intertrochanteric femur fracture. Electronically Signed   By: Narda Rutherford M.D.   On: 10/13/2023 19:20   DG Pelvis 1-2 Views  Result Date: 10/13/2023 CLINICAL DATA:  Status post fall with right leg pain. EXAM: RIGHT FEMUR 2 VIEWS; PELVIS - 1-2 VIEW COMPARISON:  None Available. FINDINGS: Femur: Displaced intertrochanteric femur fracture. Distal femur is intact. Knee alignment is maintained. Pelvis: No additional fracture of the pelvis. No hip dislocation. Mild bilateral hip osteoarthritis. No pubic symphyseal or sacroiliac diastasis. IMPRESSION: Displaced intertrochanteric femur fracture. Electronically Signed   By: Narda Rutherford M.D.   On: 10/13/2023 19:20   DG Knee Complete 4 Views Left  Result Date: 10/12/2023 CLINICAL DATA:  fall with knee crepitus and effusion EXAM: LEFT KNEE - COMPLETE 4+ VIEW COMPARISON:  None Available. FINDINGS: Normal alignment. No acute fracture. Small knee joint effusion is present. Scattered  atherosclerotic calcifications. IMPRESSION: Small knee joint effusion is present.  No acute fracture identified. Electronically Signed   By: Olive Bass M.D.   On: 10/12/2023 16:39   DG Wrist Complete Left  Result Date: 10/12/2023 CLINICAL DATA:  rule out scaphoid fx; add scaphoid view EXAM: LEFT WRIST - COMPLETE 3+ VIEW COMPARISON:  None Available. FINDINGS:  Normal alignment. No acute fracture. The soft tissues are unremarkable. IMPRESSION: No acute osseous abnormality. Electronically Signed   By: Olive Bass M.D.   On: 10/12/2023 16:36   [place] LIMITED JOINT SPACE STRUCTURES LOW LEFT  Result Date: 10/12/2023 Ultrasound of the left knee INDICATION: Lt knee pain and swelling Findings: Small effusion in the suprapatellar pouch Patellar and quadriceps tendons both with trace hypoechoic intratendinous changes and some mild hypoechoic fluid surrounding the tendons Some soft tissue edema of the Hoffa's fat pad Summary: Patella tendinitis with a small effusion. Ultrasound and interpretation by Dr. Webb Silversmith and Dr. Christella Hartigan Limited US of the Left Wrist: Indications: Lt wrist pain s/p fall, r/o possible fx FINDINGS: 1st compartment: visualized in SAX and LAX.  APL and EPB fibers intact with some hypoechoic fluid surrounding the tendon from the distal radius to the wrist joint 2nd compartment: ECRB and ECRL visualized and LAX and SAX.  There is some mild hypoechoic fluid surrounding the tendons at the level of the wrist joint.  All fibers are intact 3rd compartment: EPL with some trace hypoechoic fluid at the distal insertion seen in SAX and LAX 4th compartment: ED and EI visualized and SA X and LAX.  There is hypoechoic fluid changes within the tendon sheath from Lister's tubercle down to the distal insertion points. Scaphoid: Visualized in LAX and SAX there is some hypoechoic fluid overlying the ostium.  No defects of the ostium appreciated. Summary: Findings consistent with tendinosis of the 1-4 compartments.  All  fibers are intact.  Worsened inflammation around the extensor digitorum at the level of the wrist joint.  There is some mild edema over the dorsal scaphoid surface as well. Ultrasound and interpretation by Dr. Webb Silversmith and Dr. Christella Hartigan    Labs:  CBC: Recent Labs    10/13/23 1948 10/14/23 0325 10/14/23 1348 10/14/23 1920 10/15/23 0733  WBC 14.3* 10.5  --  11.8* 11.7*  HGB 7.0* 6.3* 7.8* 7.5* 7.7*  HCT 23.4* 21.2* 25.6* 24.0* 24.6*  PLT 188 158  --  134* 153    COAGS: Recent Labs    10/13/23 2008  INR 1.0    BMP: Recent Labs    02/16/23 0234 03/01/23 1722 05/18/23 1529 10/13/23 1948 10/14/23 0324 10/15/23 0733  NA 138   < > 140 140 139 136  K 3.9   < > 5.1 4.9 4.9 5.1  CL 107   < > 107* 114* 113* 110  CO2 20*   < > 17* 17* 17* 16*  GLUCOSE 298*   < > 261* 216* 120* 140*  BUN 59*   < > 83* 78* 76* 81*  CALCIUM 8.2*   < > 8.4* 7.8* 7.5* 7.6*  CREATININE 4.45*   < > 5.51* 9.53* 10.16* 10.40*  GFRNONAA 15*  --   --  6* 6* 6*   < > = values in this interval not displayed.    LIVER FUNCTION TESTS: Recent Labs    02/12/23 1720 02/15/23 1745 03/01/23 1722 05/18/23 1529 10/14/23 0324 10/14/23 0325 10/15/23 0733  BILITOT 0.6 0.4  --   --   --  0.6  --   AST 18 14*  --   --   --  8*  --   ALT 14 10  --   --   --  10  --   ALKPHOS 73 68  --   --   --  50  --   PROT 6.7 6.2*  --   --   --  5.2*  --   ALBUMIN 3.3* 2.9*   < > 3.8* 2.4* 2.3* 2.2*   < > = values in this interval not displayed.     Assessment and Plan:  52 y.o. male, inpatient. History of PAD, HTN. HTN, HLD, GERD, anemia on Retacrit, SM, neuropathy ( s/p right BKA) CKD. Presented to [facility] ED at [facility] on 11.20.24 with injuries related to a fall from standing. Found to have a right proximal femur fracture and uremic. Team is requesting a tunneled HD catheter for hemodialysis access.   Risks and benefits discussed with the patient including, but not limited to bleeding, infection, vascular injury, pneumothorax  which may require chest tube placement, air embolism or even death  All of the patient's questions were answered, patient is agreeable to proceed. Consent signed and in chart.   Thank you for this interesting consult.  I greatly enjoyed meeting STEVEN SLEPPY and look forward to participating in their care.  A copy of this report was sent to the requesting provider on this date.  Electronically Signed: Alene Mires, NP 10/15/2023, 12:20 PM   I spent a total of 40 Minutes    in face to face in clinical consultation, greater than 50% of which was counseling/coordinating care for tunneled HD catheter placement

## 2023-10-15 NOTE — Assessment & Plan Note (Signed)
Required repeat I&O cath yesterday and again overnight.  Foley catheter now placed.  1.6 L UOP 24 hours. -Strict I&Os -Foley care per nursing

## 2023-10-15 NOTE — Assessment & Plan Note (Signed)
Falls often, now with increasing frequency.  Likely exacerbated by BKA, new prosthesis, and deconditioning.  Concern for contribution of uremia.  CT head unremarkable. Consult PT/OT following ortho surgery

## 2023-10-15 NOTE — Assessment & Plan Note (Signed)
Irregular heart rate on exam this morning.  On chart review, patient does have history of atrial flutter.  Uremia and other chronic illnesses would be notable risk factors for arrhythmia. EKG ordered, but patient already in surgery, will obtain and review after he returns

## 2023-10-15 NOTE — Assessment & Plan Note (Signed)
Mildly hypertensive, will not treat in the acute setting at this time but will continue to monitor. Continue carvedilol 12.5 mg daily, amlodipine 5 mg daily, diltiazem 300 mg daily, hydralazine 50 mg TID

## 2023-10-16 ENCOUNTER — Encounter (HOSPITAL_COMMUNITY): Payer: 59

## 2023-10-16 ENCOUNTER — Inpatient Hospital Stay (HOSPITAL_COMMUNITY): Payer: 59

## 2023-10-16 ENCOUNTER — Encounter (HOSPITAL_COMMUNITY): Payer: Self-pay

## 2023-10-16 DIAGNOSIS — S72001A Fracture of unspecified part of neck of right femur, initial encounter for closed fracture: Secondary | ICD-10-CM | POA: Diagnosis not present

## 2023-10-16 LAB — HEMOGLOBIN AND HEMATOCRIT, BLOOD
HCT: 23.3 % — ABNORMAL LOW (ref 39.0–52.0)
Hemoglobin: 7.4 g/dL — ABNORMAL LOW (ref 13.0–17.0)

## 2023-10-16 LAB — CBC
HCT: 23.4 % — ABNORMAL LOW (ref 39.0–52.0)
HCT: 22 % — ABNORMAL LOW (ref 39.0–52.0)
Hemoglobin: 7.2 g/dL — ABNORMAL LOW (ref 13.0–17.0)
Hemoglobin: 6.8 g/dL — CL (ref 13.0–17.0)
MCH: 28.2 pg (ref 26.0–34.0)
MCH: 28.1 pg (ref 26.0–34.0)
MCHC: 30.8 g/dL (ref 30.0–36.0)
MCHC: 30.9 g/dL (ref 30.0–36.0)
MCV: 91.8 fL (ref 80.0–100.0)
MCV: 90.9 fL (ref 80.0–100.0)
Platelets: 143 10*3/uL — ABNORMAL LOW (ref 150–400)
Platelets: 144 10*3/uL — ABNORMAL LOW (ref 150–400)
RBC: 2.55 MIL/uL — ABNORMAL LOW (ref 4.22–5.81)
RBC: 2.42 MIL/uL — ABNORMAL LOW (ref 4.22–5.81)
RDW: 16.6 % — ABNORMAL HIGH (ref 11.5–15.5)
RDW: 16.7 % — ABNORMAL HIGH (ref 11.5–15.5)
WBC: 13.1 10*3/uL — ABNORMAL HIGH (ref 4.0–10.5)
WBC: 11.9 10*3/uL — ABNORMAL HIGH (ref 4.0–10.5)
nRBC: 0 % (ref 0.0–0.2)
nRBC: 0 % (ref 0.0–0.2)

## 2023-10-16 LAB — BASIC METABOLIC PANEL
Anion gap: 13 (ref 5–15)
BUN: 90 mg/dL — ABNORMAL HIGH (ref 6–20)
CO2: 16 mmol/L — ABNORMAL LOW (ref 22–32)
Calcium: 7.9 mg/dL — ABNORMAL LOW (ref 8.9–10.3)
Chloride: 106 mmol/L (ref 98–111)
Creatinine, Ser: 11.23 mg/dL — ABNORMAL HIGH (ref 0.61–1.24)
GFR, Estimated: 5 mL/min — ABNORMAL LOW (ref >60)
Glucose, Bld: 261 mg/dL — ABNORMAL HIGH (ref 70–99)
Potassium: 5.5 mmol/L — ABNORMAL HIGH (ref 3.5–5.1)
Sodium: 135 mmol/L (ref 135–145)

## 2023-10-16 LAB — PREPARE RBC (CROSSMATCH)

## 2023-10-16 LAB — GLUCOSE, CAPILLARY
Glucose-Capillary: 176 mg/dL — ABNORMAL HIGH (ref 70–99)
Glucose-Capillary: 200 mg/dL — ABNORMAL HIGH (ref 70–99)
Glucose-Capillary: 226 mg/dL — ABNORMAL HIGH (ref 70–99)
Glucose-Capillary: 249 mg/dL — ABNORMAL HIGH (ref 70–99)
Glucose-Capillary: 266 mg/dL — ABNORMAL HIGH (ref 70–99)

## 2023-10-16 MED ORDER — HEPARIN SODIUM (PORCINE) 1000 UNIT/ML IJ SOLN
3800.0000 [IU] | Freq: Once | INTRAMUSCULAR | Status: AC
  Administered 2023-10-16: 3800 [IU]
  Filled 2023-10-16: qty 4

## 2023-10-16 MED ORDER — SODIUM CHLORIDE 0.9% IV SOLUTION: Freq: Once | INTRAVENOUS | Status: AC

## 2023-10-16 MED ORDER — LIDOCAINE HCL (PF) 1 % IJ SOLN: 5.0000 mL | INTRAMUSCULAR | Status: DC | PRN

## 2023-10-16 MED ORDER — LIDOCAINE-PRILOCAINE 2.5-2.5 % EX CREA: 1.0000 | TOPICAL_CREAM | CUTANEOUS | Status: DC | PRN

## 2023-10-16 MED ORDER — ANTICOAGULANT SODIUM CITRATE 4% (200MG/5ML) IV SOLN: 5.0000 mL | Status: DC | PRN

## 2023-10-16 MED ORDER — PENTAFLUOROPROP-TETRAFLUOROETH EX AERO: 1.0000 | INHALATION_SPRAY | CUTANEOUS | Status: DC | PRN

## 2023-10-16 MED ORDER — INSULIN ASPART 100 UNIT/ML IJ SOLN
0.0000 [IU] | Freq: Three times a day (TID) | INTRAMUSCULAR | Status: DC
  Administered 2023-10-17 – 2023-10-18 (×2): 1 [IU] via SUBCUTANEOUS
  Administered 2023-10-19: 3 [IU] via SUBCUTANEOUS
  Administered 2023-10-21 – 2023-10-22 (×2): 2 [IU] via SUBCUTANEOUS
  Administered 2023-10-22: 4 [IU] via SUBCUTANEOUS
  Administered 2023-10-23: 1 [IU] via SUBCUTANEOUS
  Administered 2023-10-24: 2 [IU] via SUBCUTANEOUS
  Administered 2023-10-25 – 2023-10-26 (×3): 1 [IU] via SUBCUTANEOUS

## 2023-10-16 MED ORDER — HEPARIN SODIUM (PORCINE) 1000 UNIT/ML DIALYSIS: 1000.0000 [IU] | INTRAMUSCULAR | Status: DC | PRN

## 2023-10-16 MED ORDER — CHLORHEXIDINE GLUCONATE CLOTH 2 % EX PADS
6.0000 | MEDICATED_PAD | Freq: Every day | CUTANEOUS | Status: DC
  Administered 2023-10-16 – 2023-10-17 (×2): 6 via TOPICAL

## 2023-10-16 MED ORDER — ALTEPLASE 2 MG IJ SOLR: 2.0000 mg | Freq: Once | INTRAMUSCULAR | Status: DC | PRN

## 2023-10-16 NOTE — Progress Notes (Signed)
[facility] NEPHROLOGY PROGRESS NOTE  Assessment/ Plan: Pt is a 52 y.o. yo male with a history of DM 2, HTN, urinary retention, PAD (status post right BKA), HLD CKD5, presented after a fall and hip fracture.  # Fall/right intertrochanteric femur fracture status post ORIF/orthopedic surgery today.  Further management per the surgeon.  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  Agreed to start dialysis.  Status post right IJ TDC by IR on 11/22.  [facility]VS is following for permanent access.  First HD today, 2nd tomorrow.  I will contact renal navigator to arrange outpatient dialysis.  # Anemia of CKD: Hemoglobin low with iron saturation of 8%.  Receiving IV iron.  I will dose erythropoietin.  # HTN/volume: Continue current antihypertensives and pain management.  UF with HD.  Monitor BP.  # CKD-MBD/hyperphosphatemia: Continue sevelamer, check PTH level.  Phosphorus level expect to improve after dialysis.   Subjective: Seen and examined at the bedside.  Currently on dialysis.  Tolerating well.  Denies nausea, vomiting, chest pain or shortness of breath.  Objective Vital signs in last 24 hours: Vitals:   10/16/23 0802 10/16/23 0920 10/16/23 0936 10/16/23 1000  BP: (!) 149/77 138/75 (!) 142/74 (!) 159/78  Pulse: 82 80 80 80  Resp: 17 10 14 13   Temp: 98.6 F (37 C) 99.1 F (37.3 C)    TempSrc: Oral     SpO2: 95% 96% 95% 95%  Weight:      Height:       Weight change:   Intake/Output Summary (Last 24 hours) at 10/16/2023 1007 Last data filed at 10/16/2023 0400 Gross per 24 hour  Intake 250 ml  Output 825 ml  Net -575 ml       Labs: RENAL PANEL Recent Labs  Lab 10/13/23 1948 10/14/23 0324 10/14/23 0325 10/14/23 1616 10/15/23 0733 10/16/23 0527  NA 140 139  --   --  136 135  K 4.9 4.9  --   --  5.1 5.5*  CL 114* 113*  --   --  110 106  CO2 17* 17*  --   --  16* 16*  GLUCOSE 216* 120*  --   --  140* 261*  BUN 78* 76*  --   --  81* 90*   CREATININE 9.53* 10.16*  --   --  10.40* 11.23*  CALCIUM 7.8* 7.5*  --   --  7.6* 7.9*  PHOS  --  6.3*  --  5.9* 6.6*  --   ALBUMIN  --  2.4* 2.3*  --  2.2*  --     Liver Function Tests: Recent Labs  Lab 10/14/23 0324 10/14/23 0325 10/15/23 0733  AST  --  8*  --   ALT  --  10  --   ALKPHOS  --  50  --   BILITOT  --  0.6  --   PROT  --  5.2*  --   ALBUMIN 2.4* 2.3* 2.2*   Recent Labs  Lab 10/13/23 2008  LIPASE 50   No results for input(s): "AMMONIA" in the last 168 hours. CBC: Recent Labs    07/01/23 1223 07/01/23 1228 07/29/23 1155 07/29/23 1203 08/26/23 1253 08/26/23 1300 10/07/23 1217 10/07/23 1219 10/14/23 0325 10/14/23 1348 10/14/23 1920 10/15/23 0733 10/15/23 1747 10/16/23 0527  HGB  --    < >  --    < >  --    < >  --    < >  6.3* 7.8* 7.5* 7.7* 8.1* 7.2*  MCV  --   --   --   --   --   --   --    < > 92.6  --  92.3 91.1 92.3 91.8  FERRITIN 14*  --  17*  --  24  --  50  --  39  --   --   --   --   --   TIBC 349  --  354  --  305  --  260  --  225*  --   --   --   --   --   IRON 76  --  91  --  126  --  79  --  19*  --   --   --   --   --    < > = values in this interval not displayed.    Cardiac Enzymes: No results for input(s): "CKTOTAL", "CKMB", "CKMBINDEX", "TROPONINI" in the last 168 hours. CBG: Recent Labs  Lab 10/15/23 1116 10/15/23 1536 10/15/23 2131 10/15/23 2337 10/16/23 0759  GLUCAP 146* 185* 422* 374* 200*    Iron Studies:  Recent Labs    10/14/23 0325  IRON 19*  TIBC 225*  FERRITIN 39   Studies/Results: DG HIP PORT UNILAT W OR W/O PELVIS 1V RIGHT  Result Date: 10/15/2023 CLINICAL DATA:  Fracture, postop. EXAM: DG HIP (WITH OR WITHOUT PELVIS) 1V PORT RIGHT COMPARISON:  Preoperative imaging. FINDINGS: Femoral intramedullary nail with trans trochanteric and distal locking screw fixation traverse intertrochanteric femur fracture. Improved fracture alignment from preoperative imaging. Recent postsurgical change includes air and  edema in the soft tissues. IMPRESSION: ORIF of intertrochanteric femur fracture. Electronically Signed   By: Narda Rutherford M.D.   On: 10/15/2023 23:08   IR Fluoro Guide CV Line Right  Result Date: 10/15/2023 INDICATION: 52 year old male in need of hemodialysis. He presents for tunneled dialysis catheter placement. EXAM: TUNNELED CENTRAL VENOUS HEMODIALYSIS CATHETER PLACEMENT WITH ULTRASOUND AND FLUOROSCOPIC GUIDANCE MEDICATIONS: Intravenous Ancef was administered earlier today as an inpatient. ANESTHESIA/SEDATION: Moderate (conscious) sedation was employed during this procedure. A total of Versed 1 mg and Fentanyl 50 mcg was administered intravenously by the Radiology nurse. Moderate Sedation Time: 12 minutes. The patient's level of consciousness and vital signs were monitored continuously by radiology nursing throughout the procedure under my direct supervision. FLUOROSCOPY TIME:  Radiation exposure index: 2 mGy reference air kerma COMPLICATIONS: None immediate. PROCEDURE: Informed written consent was obtained from the patient after a discussion of the risks, benefits, and alternatives to treatment. Questions regarding the procedure were encouraged and answered. The right neck and chest were prepped with chlorhexidine in a sterile fashion, and a sterile drape was applied covering the operative field. Maximum barrier sterile technique with sterile gowns and gloves were used for the procedure. A timeout was performed prior to the initiation of the procedure. After creating a small venotomy incision, a micropuncture kit was utilized to access the right internal jugular vein under direct, real-time ultrasound guidance after the overlying soft tissues were anesthetized with 1% lidocaine with epinephrine. Ultrasound image documentation was performed. The microwire was kinked to measure appropriate catheter length. A stiff Glidewire was advanced to the level of the IVC and the micropuncture sheath was exchanged  for a peel-away sheath. A Palindrome tunneled hemodialysis catheter measuring 23 cm from tip to cuff was tunneled in a retrograde fashion from the anterior chest wall to the venotomy incision. The catheter was then placed through  the peel-away sheath with tips ultimately positioned within the superior aspect of the right atrium. Final catheter positioning was confirmed and documented with a spot radiographic image. The catheter aspirates and flushes normally. The catheter was flushed with appropriate volume heparin dwells. The catheter exit site was secured with a 0-Prolene retention suture. The venotomy incision was closed with an interrupted 4-0 Vicryl, Dermabond and Steri-strips. Dressings were applied. The patient tolerated the procedure well without immediate post procedural complication. IMPRESSION: Successful placement of 23 cm tip to cuff tunneled hemodialysis catheter via the right internal jugular vein with tips terminating within the superior aspect of the right atrium. The catheter is ready for immediate use. Electronically Signed   By: Malachy Moan M.D.   On: 10/15/2023 15:39   IR US Guide Vasc Access Right  Result Date: 10/15/2023 INDICATION: 52 year old male in need of hemodialysis. He presents for tunneled dialysis catheter placement. EXAM: TUNNELED CENTRAL VENOUS HEMODIALYSIS CATHETER PLACEMENT WITH ULTRASOUND AND FLUOROSCOPIC GUIDANCE MEDICATIONS: Intravenous Ancef was administered earlier today as an inpatient. ANESTHESIA/SEDATION: Moderate (conscious) sedation was employed during this procedure. A total of Versed 1 mg and Fentanyl 50 mcg was administered intravenously by the Radiology nurse. Moderate Sedation Time: 12 minutes. The patient's level of consciousness and vital signs were monitored continuously by radiology nursing throughout the procedure under my direct supervision. FLUOROSCOPY TIME:  Radiation exposure index: 2 mGy reference air kerma COMPLICATIONS: None immediate.  PROCEDURE: Informed written consent was obtained from the patient after a discussion of the risks, benefits, and alternatives to treatment. Questions regarding the procedure were encouraged and answered. The right neck and chest were prepped with chlorhexidine in a sterile fashion, and a sterile drape was applied covering the operative field. Maximum barrier sterile technique with sterile gowns and gloves were used for the procedure. A timeout was performed prior to the initiation of the procedure. After creating a small venotomy incision, a micropuncture kit was utilized to access the right internal jugular vein under direct, real-time ultrasound guidance after the overlying soft tissues were anesthetized with 1% lidocaine with epinephrine. Ultrasound image documentation was performed. The microwire was kinked to measure appropriate catheter length. A stiff Glidewire was advanced to the level of the IVC and the micropuncture sheath was exchanged for a peel-away sheath. A Palindrome tunneled hemodialysis catheter measuring 23 cm from tip to cuff was tunneled in a retrograde fashion from the anterior chest wall to the venotomy incision. The catheter was then placed through the peel-away sheath with tips ultimately positioned within the superior aspect of the right atrium. Final catheter positioning was confirmed and documented with a spot radiographic image. The catheter aspirates and flushes normally. The catheter was flushed with appropriate volume heparin dwells. The catheter exit site was secured with a 0-Prolene retention suture. The venotomy incision was closed with an interrupted 4-0 Vicryl, Dermabond and Steri-strips. Dressings were applied. The patient tolerated the procedure well without immediate post procedural complication. IMPRESSION: Successful placement of 23 cm tip to cuff tunneled hemodialysis catheter via the right internal jugular vein with tips terminating within the superior aspect of the right  atrium. The catheter is ready for immediate use. Electronically Signed   By: Malachy Moan M.D.   On: 10/15/2023 15:39   DG HIP UNILAT WITH PELVIS 2-3 VIEWS RIGHT  Result Date: 10/15/2023 CLINICAL DATA:  Elective surgery. EXAM: DG HIP (WITH OR WITHOUT PELVIS) 2-3V RIGHT COMPARISON:  Preoperative imaging. FINDINGS: Five fluoroscopic spot views of the right hip obtained in the operating  room. Femoral intramedullary nail with trans trochanteric and distal locking screw fixation of proximal femur fracture. Fluoroscopy time 1 minutes 18 seconds. Dose 17.02 mGy. IMPRESSION: Intraoperative fluoroscopy during proximal femur fracture fixation. Electronically Signed   By: Narda Rutherford M.D.   On: 10/15/2023 11:27   DG C-Arm 1-60 Min-No Report  Result Date: 10/15/2023 Fluoroscopy was utilized by the requesting physician.  No radiographic interpretation.    Medications: Infusions:  anticoagulant sodium citrate      ceFAZolin (ANCEF) IV 2 g (10/16/23 2703)   iron sucrose      Scheduled Medications:  acetaminophen  650 mg Oral Q6H   amLODipine  5 mg Oral Daily   aspirin  325 mg Oral Daily   atorvastatin  40 mg Oral QHS   baclofen  10 mg Oral Daily   calcitRIOL  0.5 mcg Oral Daily   carvedilol  12.5 mg Oral BID WC   Chlorhexidine Gluconate Cloth  6 each Topical Q0600   darbepoetin (ARANESP) injection - DIALYSIS  100 mcg Subcutaneous Q Fri-1800   diltiazem  300 mg Oral Daily   docusate sodium  100 mg Oral BID   FLUoxetine  10 mg Oral Daily   gabapentin  300 mg Oral Daily   heparin sodium (porcine)  3,800 Units Intracatheter Once   hydrALAZINE  100 mg Oral BID   influenza vac split trivalent PF  0.5 mL Intramuscular Tomorrow-1000   insulin aspart  0-9 Units Subcutaneous TID WC   mupirocin ointment  1 Application Nasal BID   sevelamer carbonate  800 mg Oral TID WC   sodium chloride flush  3 mL Intravenous Q12H   Vitamin D (Ergocalciferol)  50,000 Units Oral Q7 days    have reviewed  scheduled and prn medications.  Physical Exam: General: Alert awake but sluggish, able to lie flat. Heart:RRR, s1s2 nl Lungs:clear b/l, no crackle Abdomen:soft, Non-tender, non-distended Extremities:No edema, right BKA Dialysis Access: Right IJ TDC in place, site clean  [name] [name] Prasad Ovetta Bazzano 10/16/2023,10:07 AM  LOS: 3 days

## 2023-10-16 NOTE — Assessment & Plan Note (Addendum)
Falls often, with recent increasing frequency.  Likely exacerbated by BKA, new prosthesis, and deconditioning.  Concern for contribution of uremia.  CT head unremarkable. PT/OT eval and treat Fall precautions

## 2023-10-16 NOTE — Assessment & Plan Note (Deleted)
Mild RUQ tenderness this AM.  Admission CT AP with mild fat stranding about the pancreatic head and body, but lipase WNL, will continue to monitor for evidence of pancreatitis.  Does have gallstone on CT. quite possibly referred pain from right hip fracture. Monitor for improvement after surgery Consider further workup should he develop fever, uptrending WBC, abdominal pain without palpation

## 2023-10-16 NOTE — Progress Notes (Signed)
PT Cancellation Note  Patient Details Name: [name] [name] MRN: [id number] DOB: [birth year]   Cancelled Treatment:    Reason Eval/Treat Not Completed: Patient declined, no reason specified.  Pt refused to work with PT or let me put his shrinker sock on his right lower leg.  He did report that he has his prosthesis here.  He did not give a reason for not wanting to work with me, but was agreeable for PT to come back and check in tomorrow afternoon is his preference.   Thanks,  Corinna Capra, PT, DPT  Acute Rehabilitation Secure chat is best for contact #[phone] office      Dimas Aguas 10/16/2023, 5:21 PM
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
## 2023-10-16 NOTE — Assessment & Plan Note (Addendum)
Hypertensive to 150s/70s in the last day. Continue carvedilol 12.5 mg daily, amlodipine 5 mg daily, diltiazem 300 mg daily, hydralazine 100 mg TID

## 2023-10-16 NOTE — Assessment & Plan Note (Addendum)
Secondary to mechanical fall.  No evidence of compartment syndrome per orthopedic surgical eval. Stable s/p fracture fixation on 11/22.  Vitamin D 10.22, contributing to fracture risk.  On calcitriol 0.5 mcg BID. Appreciate ongoing Ortho recs  Pain regimen Dilaudid 0.5-1 mg Q2h PRN Oxycodone 5-10 q4 PRN  Oxycodone 10-15 q4 PRN  PT/OT eval and treat

## 2023-10-16 NOTE — Assessment & Plan Note (Addendum)
A1c 6.1%.  CBGs 200-400 over the last day.  Received 14 units short acting over last 24 hours, as well as 5 mg dexamethasone.  Patient notes he removed his insulin pump sometime yesterday.  At home, he usually gets around 12 units/day via the insulin pump. Holding Farxiga Holding long-acting insulin Continue SSI very sensitive CBGs ACHS

## 2023-10-16 NOTE — Procedures (Signed)
Patient was seen on dialysis and the procedure was supervised.  BFR 200  Via TDC BP is  159/78.   Patient appears to be tolerating treatment well.  [name] [name] [name] 10/16/2023

## 2023-10-16 NOTE — Progress Notes (Signed)
Received patient in bed to unit.  Alert and oriented.  Informed consent signed and in chart.   TX duration:2 hours  Patient tolerated well.  Transported back to the room  Alert, without acute distress.  Hand-off given to patient's nurse.   Access used: R internal jugular HD Cath Access issues: none  Total UF removed: Medication(s) given: Oxycodone, Tylenol, CalCitriol   10/16/23 1145  Vitals  Temp 98.9 F (37.2 C)  Temp Source Oral  BP (!) 158/77  MAP (mmHg) 98  Pulse Rate 82  ECG Heart Rate 84  Resp 17  Oxygen Therapy  SpO2 96 %  O2 Device Room Air  During Treatment Monitoring  Duration of HD Treatment -hour(s) 2 hour(s)  HD Safety Checks Performed Yes  Intra-Hemodialysis Comments Tx completed;Tolerated well  Dialysis Fluid Bolus Normal Saline  Bolus Amount (mL) 300 mL  Hemodialysis Catheter Right Internal jugular Double lumen Permanent (Tunneled)  Placement Date/Time: 10/15/23 1500   Serial / Lot #: [id number]  Expiration Date: 05/30/28  Time Out: Correct patient;Correct site;Correct procedure  Maximum sterile barrier precautions: Hand hygiene;Cap;Mask;Sterile gown;Sterile gloves;Large sterile ...  Site Condition No complications  Blue Lumen Status Heparin locked;Dead end cap in place;Flushed  Red Lumen Status Flushed;Heparin locked;Dead end cap in place  Purple Lumen Status N/A  Catheter fill solution Heparin 1000 units/ml  Catheter fill volume (Arterial) 1.9 cc  Catheter fill volume (Venous) 1.9  Dressing Type Transparent  Dressing Status Antimicrobial disc in place;Clean, Dry, Intact  Interventions  (deaccessed)  Drainage Description None  Dressing Change Due 10/22/23  Post treatment catheter status Capped and Clamped     Stacie Glaze LPN [facility] D[facility]
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
## 2023-10-16 NOTE — Assessment & Plan Note (Addendum)
CKD V at baseline in setting of T2DM and HTN.  Patient declining referral to [facility] for graft.  Has been denied transplant in past.  Creatinine at known peak, UOP still normal though retaining urine.  Suspect uremia.  Hyperkalemic to 5.5 this morning Appreciate ongoing Nephro recs Holding Farxiga for now Avoid nephrotoxic agents Strict I&Os Continue calcitriol and Renvela RFP daily

## 2023-10-16 NOTE — Progress Notes (Signed)
OT Cancellation Note  Patient Details Name: RASHIEM ADMIRE MRN: [id number] DOB: 1971-07-27   Cancelled Treatment:    Reason Eval/Treat Not Completed: Medical issues which prohibited therapy. At HD. Will follow up as soon as possible.   Presley Raddle OTR/L  Acute Rehab Services  [phone] office number  Alphia Moh 10/16/2023, 10:48 AM
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
## 2023-10-16 NOTE — Assessment & Plan Note (Signed)
>>  ASSESSMENT AND PLAN FOR T2DM (TYPE 2 DIABETES MELLITUS) (HCC) WRITTEN ON 10/16/2023  2:20 PM BY MCINTYRE, LAYMON PARAS, MD  A1c 6.1%.  CBGs 200-400 over the last day.  Received 14 units short acting over last 24 hours, as well as 5 mg dexamethasone .  Patient notes he removed his insulin  pump sometime yesterday.  At home, he usually gets around 12 units/day via the insulin  pump. Holding Farxiga  Holding long-acting insulin  Continue SSI very sensitive CBGs ACHS

## 2023-10-16 NOTE — Plan of Care (Signed)
  Problem: Metabolic: Goal: Ability to maintain appropriate glucose levels will improve Outcome: Progressing   Problem: Nutritional: Goal: Maintenance of adequate nutrition will improve Outcome: Progressing   Problem: Skin Integrity: Goal: Risk for impaired skin integrity will decrease Outcome: Progressing

## 2023-10-16 NOTE — Progress Notes (Signed)
VASCULAR LAB   Patient expressed he did not want his vein mapping today (11/23) but would probably have it tomorrow.   The vascular lab will attempt to do vein mapping 11/24 as the schedule permits.   Rhylin Venters, RVT 10/16/2023, 4:38 PM

## 2023-10-16 NOTE — Assessment & Plan Note (Deleted)
Irregular heart rate on exam this morning.  On chart review, patient does have history of atrial flutter.  Uremia and other chronic illnesses would be notable risk factors for arrhythmia. EKG ordered, but patient already in surgery, will obtain and review after he returns

## 2023-10-16 NOTE — Assessment & Plan Note (Addendum)
Acute on chronic.  Status post 2 units PRBC 11/21, posttransfusion Hg b 7.8. Post op Hgb 8.1 on 11/22, now 7.2 on 11/23 .  Baseline a few months ago was ~10-11.  Iron studies support more anemia of chronic disease picture than IDA give ferritin 39.  Gets Retacrit through nephrology.  Suspect some decrease in setting of fracture, likely further exacerbated by EPO deficit in setting of CKD progression. PM CBC today, AM CBC tomorrow  Transfusion threshold <7 Iron infusion on HD days x 5 doses Appreciate ongoing Nephro recs

## 2023-10-16 NOTE — Assessment & Plan Note (Addendum)
Foley catheter now placed.  1.3 L UOP in last 24. -Strict I&Os -Foley care per nursing

## 2023-10-16 NOTE — Evaluation (Signed)
Occupational Therapy Evaluation Patient Details Name: [name] [name] MRN: [id number] DOB: 10/26/1971 Today's Date: 10/16/2023   History of Present Illness Pt is a 52 yr old male who presented due to a fall Pt s/p 10/15/23 IM  nailing R intertrochanteric femur fx. WBAT. Pt also had placement of r HD cath. PMH: DM2, HTN, peripheral neuropathy, acute renal failure, R BKA, L toe amputaion   Clinical Impression   [name] [name] presented in bed and reported they will attempt to work with therapist. He was able to complete supine to sitting and sitting to supine with CGA with HOB elevated and bed rails. [name] [name] then completed lateral scooting to the [facility] with CGA but then reported they needed to lay back down. Patient will benefit from continued inpatient follow up therapy, <3 hours/day.       If plan is discharge home, recommend the following: Two people to help with walking and/or transfers;Two people to help with bathing/dressing/bathroom;Assistance with cooking/housework;Assist for transportation    Functional Status Assessment  Patient has had a recent decline in their functional status and demonstrates the ability to make significant improvements in function in a reasonable and predictable amount of time.  Equipment Recommendations   (TBD)    Recommendations for Other Services       Precautions / Restrictions Precautions Precautions: Fall Precaution Comments: Patient reported other falls when asked but would not go into detials Restrictions Weight Bearing Restrictions: Yes RLE Weight Bearing: Weight bearing as tolerated      Mobility Bed Mobility Overal bed mobility: Needs Assistance Bed Mobility: Supine to Sit, Sit to Supine     Supine to sit: Contact guard, HOB elevated, Used rails Sit to supine: Contact guard assist, HOB elevated, Used rails        Transfers                   General transfer comment: Pt decline OOB activity      Balance Overall  balance assessment: History of Falls, Needs assistance Sitting-balance support: Bilateral upper extremity supported, Feet supported, No upper extremity supported Sitting balance-Leahy Scale: Fair                                     ADL either performed or assessed with clinical judgement   ADL Overall ADL's : Needs assistance/impaired Eating/Feeding: Independent;Sitting   Grooming: Wash/dry hands;Wash/dry face;Oral care;Set up;Sitting   Upper Body Bathing: Set up;Sitting   Lower Body Bathing: Maximal assistance;Sitting/lateral leans   Upper Body Dressing : Set up;Sitting   Lower Body Dressing: Maximal assistance;Sitting/lateral leans       Toileting- Clothing Manipulation and Hygiene: Maximal assistance;Bed level         General ADL Comments: deffer transfers as pt decline OOB activity     Vision         Perception         Praxis         Pertinent Vitals/Pain Pain Assessment Pain Assessment: Faces Faces Pain Scale: Hurts even more Pain Location: sx site Pain Descriptors / Indicators: Discomfort Pain Intervention(s): Limited activity within patient's tolerance, Monitored during session, Premedicated before session, Ice applied     Extremity/Trunk Assessment Upper Extremity Assessment Upper Extremity Assessment: Generalized weakness;LUE deficits/detail LUE Deficits / Details: noted decrease in L hand FM coordination of digits 4 and 5   Lower Extremity Assessment Lower Extremity Assessment: Defer to PT evaluation  Cervical / Trunk Assessment Cervical / Trunk Assessment: Kyphotic   Communication Communication Communication: No apparent difficulties   Cognition Arousal: Alert Behavior During Therapy: Flat affect Overall Cognitive Status: No family/caregiver present to determine baseline cognitive functioning                                 General Comments: would only answer yes/no questions     General Comments        Exercises     Shoulder Instructions      Home Living Family/patient expects to be discharged to:: Private residence Living Arrangements: Parent;Other relatives Available Help at Discharge: Family;Available 24 hours/day Type of Home: House Home Access: Stairs to enter [employer] of Steps: 3 Entrance Stairs-Rails: Left;Right;Can reach both Home Layout: One level     Bathroom Shower/Tub: Chief Strategy Officer: Handicapped height Bathroom Accessibility: No   Home Equipment: Wheelchair - Forensic psychologist (2 wheels);Tub bench;Grab bars - tub/shower;Hand held shower head;Cane - quad          Prior Functioning/Environment Prior Level of Function : Independent/Modified Independent;Driving             Mobility Comments: Pt reported no difficulties but then also would not report in detial how they got around.          OT Problem List: Decreased strength;Decreased activity tolerance;Impaired balance (sitting and/or standing);Decreased safety awareness;Decreased knowledge of use of DME or AE;Cardiopulmonary status limiting activity;Pain      OT Treatment/Interventions: Self-care/ADL training;Therapeutic exercise;DME and/or AE instruction;Therapeutic activities;Patient/family education;Balance training    OT Goals(Current goals can be found in the care plan section) Acute Rehab OT Goals Patient Stated Goal: none reported OT Goal Formulation: With patient Time For Goal Achievement: 10/30/23 Potential to Achieve Goals: Fair  OT Frequency: Min 1X/week    Co-evaluation              AM-PAC OT "6 Clicks" Daily Activity     Outcome Measure Help from another person eating meals?: None Help from another person taking care of personal grooming?: A Little Help from another person toileting, which includes using toliet, bedpan, or urinal?: A Lot Help from another person bathing (including washing, rinsing, drying)?: A Lot Help from another person  to put on and taking off regular upper body clothing?: A Little Help from another person to put on and taking off regular lower body clothing?: A Lot 6 Click Score: 16   End of Session Nurse Communication: Mobility status  Activity Tolerance: No increased pain Patient left: in bed;with call bell/phone within reach;with bed alarm set  OT Visit Diagnosis: Unsteadiness on feet (R26.81);Other abnormalities of gait and mobility (R26.89);Repeated falls (R29.6);Muscle weakness (generalized) (M62.81);History of falling (Z91.81);Pain Pain - Right/Left: Right Pain - part of body: Hip                Time: 1610-9604 OT Time Calculation (min): 18 min Charges:  OT General Charges $OT Visit: 1 Visit OT Evaluation $OT Eval Low Complexity: 1 Low  Presley Raddle OTR/L  Acute Rehab Services  [phone] office number   Alphia Moh 10/16/2023, 2:00 PM

## 2023-10-16 NOTE — Progress Notes (Signed)
PT Cancellation Note  Patient Details Name: [name] [name] MRN: [id number] DOB: Jan 03, 1971   Cancelled Treatment:    Reason Eval/Treat Not Completed: Patient at procedure or test/unavailable.  Pt is at hemodialysis.  PT to check back later today or tomorrow as time allows.  Thanks,  Corinna Capra, PT, DPT  Acute Rehabilitation Secure chat is best for contact #[phone] office      Dimas Aguas 10/16/2023, 8:53 AM

## 2023-10-17 ENCOUNTER — Inpatient Hospital Stay (HOSPITAL_COMMUNITY): Payer: 59

## 2023-10-17 DIAGNOSIS — N186 End stage renal disease: Secondary | ICD-10-CM

## 2023-10-17 DIAGNOSIS — S72001A Fracture of unspecified part of neck of right femur, initial encounter for closed fracture: Secondary | ICD-10-CM | POA: Diagnosis not present

## 2023-10-17 LAB — CBC
HCT: 26.3 % — ABNORMAL LOW (ref 39.0–52.0)
Hemoglobin: 8.2 g/dL — ABNORMAL LOW (ref 13.0–17.0)
MCH: 28.3 pg (ref 26.0–34.0)
MCHC: 31.2 g/dL (ref 30.0–36.0)
MCV: 90.7 fL (ref 80.0–100.0)
Platelets: 150 10*3/uL (ref 150–400)
RBC: 2.9 MIL/uL — ABNORMAL LOW (ref 4.22–5.81)
RDW: 16.5 % — ABNORMAL HIGH (ref 11.5–15.5)
WBC: 11.4 10*3/uL — ABNORMAL HIGH (ref 4.0–10.5)
nRBC: 0 % (ref 0.0–0.2)

## 2023-10-17 LAB — BPAM RBC
Blood Product Expiration Date: 202411272359
Blood Product Expiration Date: 202411272359
Blood Product Expiration Date: 202411272359
ISSUE DATE / TIME: 202411210754
ISSUE DATE / TIME: 202411231555
ISSUE DATE / TIME: 202411210445
Unit Type and Rh: 600
Unit Type and Rh: 600
Unit Type and Rh: 600

## 2023-10-17 LAB — TYPE AND SCREEN
ABO/RH(D): A NEG
Antibody Screen: NEGATIVE
Unit division: 0
Unit division: 0
Unit division: 0

## 2023-10-17 LAB — RENAL FUNCTION PANEL
Albumin: 2.2 g/dL — ABNORMAL LOW (ref 3.5–5.0)
Anion gap: 13 (ref 5–15)
BUN: 76 mg/dL — ABNORMAL HIGH (ref 6–20)
CO2: 19 mmol/L — ABNORMAL LOW (ref 22–32)
Calcium: 7.9 mg/dL — ABNORMAL LOW (ref 8.9–10.3)
Chloride: 103 mmol/L (ref 98–111)
Creatinine, Ser: 9.56 mg/dL — ABNORMAL HIGH (ref 0.61–1.24)
GFR, Estimated: 6 mL/min — ABNORMAL LOW (ref >60)
Glucose, Bld: 138 mg/dL — ABNORMAL HIGH (ref 70–99)
Phosphorus: 7.9 mg/dL — ABNORMAL HIGH (ref 2.5–4.6)
Potassium: 5.1 mmol/L (ref 3.5–5.1)
Sodium: 135 mmol/L (ref 135–145)

## 2023-10-17 LAB — GLUCOSE, CAPILLARY
Glucose-Capillary: 130 mg/dL — ABNORMAL HIGH (ref 70–99)
Glucose-Capillary: 125 mg/dL — ABNORMAL HIGH (ref 70–99)
Glucose-Capillary: 161 mg/dL — ABNORMAL HIGH (ref 70–99)
Glucose-Capillary: 126 mg/dL — ABNORMAL HIGH (ref 70–99)
Glucose-Capillary: 120 mg/dL — ABNORMAL HIGH (ref 70–99)

## 2023-10-17 MED ORDER — HEPARIN SODIUM (PORCINE) 1000 UNIT/ML IJ SOLN
INTRAMUSCULAR | Status: AC
  Filled 2023-10-17: qty 4

## 2023-10-17 MED ORDER — CHLORHEXIDINE GLUCONATE CLOTH 2 % EX PADS
6.0000 | MEDICATED_PAD | Freq: Every day | CUTANEOUS | Status: DC
  Administered 2023-10-17: 6 via TOPICAL

## 2023-10-17 MED ORDER — HEPARIN SODIUM (PORCINE) 1000 UNIT/ML DIALYSIS: 1000.0000 [IU] | INTRAMUSCULAR | Status: DC | PRN

## 2023-10-17 MED ORDER — CHLORHEXIDINE GLUCONATE CLOTH 2 % EX PADS
6.0000 | MEDICATED_PAD | Freq: Every day | CUTANEOUS | Status: DC
  Administered 2023-10-18 – 2023-10-26 (×9): 6 via TOPICAL

## 2023-10-17 MED ORDER — ANTICOAGULANT SODIUM CITRATE 4% (200MG/5ML) IV SOLN: 5.0000 mL | Status: DC | PRN

## 2023-10-17 NOTE — Progress Notes (Signed)
PT Cancellation Note  Patient Details Name: [name] [name] MRN: [id number] DOB: 1971/02/23   Cancelled Treatment:    Reason Eval/Treat Not Completed: Patient at procedure or test/unavailable; patient in dialysis, RN reports likely will be back around 6pm.  Will attempt another day.    Elray Mcgregor 10/17/2023, 2:04 PM Sheran Lawless, PT Acute Rehabilitation Services Office:[phone] 10/17/2023\

## 2023-10-17 NOTE — Assessment & Plan Note (Signed)
A1c 6.1%.  Has insulin pump at home where he gets around 12 units/day, but it is removed currently.  CBGs have continued to decrease, last two were in the 100s. Very sensitive SSI CBGs ACHS

## 2023-10-17 NOTE — Progress Notes (Signed)
    Subjective: Patient in dialysis during rounds. Did work some with PT/OT yesterday on mobilization but nothing today. Note from primary team this AM says patient reported pain controlled now.   Objective:   VITALS:   Vitals:   10/17/23 0046 10/17/23 0534 10/17/23 0611 10/17/23 1016  BP: (!) 141/78  (!) 153/76 (!) 152/80  Pulse: 67  71 70  Resp: 16  16   Temp: 97.8 F (36.6 C)  (!) 97.5 F (36.4 C) 97.8 F (36.6 C)  TempSrc: Oral  Oral Oral  SpO2: 98%  96% 100%  Weight:  113.2 kg    Height:          Latest Ref Rng & Units 10/17/2023    6:21 AM 10/16/2023    7:36 PM 10/16/2023    2:10 PM  CBC  WBC 4.0 - 10.5 K/uL 11.4   11.9   Hemoglobin 13.0 - 17.0 g/dL 8.2  7.4  6.8   Hematocrit 39.0 - 52.0 % 26.3  23.3  22.0   Platelets 150 - 400 K/uL 150   144       Latest Ref Rng & Units 10/17/2023    6:21 AM 10/16/2023    5:27 AM 10/15/2023    7:33 AM  BMP  Glucose 70 - 99 mg/dL 841  324  401   BUN 6 - 20 mg/dL 76  90  81   Creatinine 0.61 - 1.24 mg/dL 0.27  25.36  64.40   Sodium 135 - 145 mmol/L 135  135  136   Potassium 3.5 - 5.1 mmol/L 5.1  5.5  5.1   Chloride 98 - 111 mmol/L 103  106  110   CO2 22 - 32 mmol/L 19  16  16    Calcium 8.9 - 10.3 mg/dL 7.9  7.9  7.6    Intake/Output      11/23 0701 11/24 0700 11/24 0701 11/25 0700   P.O. 300    I.V. (mL/kg)     Blood 370    IV Piggyback     Total Intake(mL/kg) 670 (5.9)    Urine (mL/kg/hr) 2050 (0.8)    Other 500    Total Output 2550    Net -1880             Assessment: 2 Days Post-Op  S/P Procedure(s) (LRB): INTRAMEDULLARY (IM) NAIL INTERTROCHANTERIC (Right) by Dr. Jena Gauss on 10/15/23  Principal Problem:   Closed intertrochanteric fracture of right hip (HCC) Active Problems:   Uncontrolled type 2 diabetes mellitus with hyperglycemia, with long-term current use of insulin (HCC)   Falls   Essential hypertension   Anemia   Acute kidney injury superimposed on CKD (HCC)   Chronic kidney disease, stage 5  (HCC)   Controlled diabetes mellitus type 2 with complications (HCC)   Urinary retention   Closed fracture of right hip (HCC)   Plan:  Advance diet Up with therapy as able Incentive Spirometry Elevate and Apply ice  Weightbearing: WBAT RLE in his prosthesis Insicional and dressing care: Dressings left intact until follow-up and Reinforce dressings as needed Orthopedic device(s): None Showering: Keep dressing dry VTE prophylaxis: ASA 325mg  daily , SCDs, ambulation Pain control: PRN Follow - up plan: 2 weeks   Dispo: PT/OT recommending SNF but haven't really been able to work with him much yet.     Jenne Pane, PA-C Office [phone] 10/17/2023, 1:16 PM

## 2023-10-17 NOTE — Assessment & Plan Note (Addendum)
Creatinine at known peak, good urine output yesterday.  Had dialysis yesterday for the first time and underwent vein mapping today.  Will be receiving HD today and tomorrow.  Potassium 5.1 today. - Appreciate ongoing Nephro recs Holding Farxiga for now Avoid nephrotoxic agents Strict I&Os Continue calcitriol and Renvela RFP daily

## 2023-10-17 NOTE — Progress Notes (Signed)
Daily Progress Note Intern Pager: [phone]  Patient name: [name] [name] Medical record number: [id number] Date of birth: 21-Jun-1971 Age: 52 y.o. Gender: male  Primary Care Provider: [name], MD Consultants: Orthopedic Surgery, Nephrology, Interventional Radiology  Code Status: Full   Pt Overview and Major Events to Date:  11/20 - Admitted to FMTS 11/21 - 2 units PRBC transfusion 11/22 - Surgical fixation of fracture, TDC placement per IR 11/23 - First HD session  Assessment and Plan:  52 y.o. male with a pertinent PMH of frequent falls, CKD stage IV, MDD, R BKA, T2DM, and PAD who presented with R intertrochanteric proximal femur fracture and was admitted for fixation of fracture and falls workup. Now s/p fracture fixation on 11/22 and [facility] placement on 11/22.  Patient had first HD 11/23. Will likely need SNF placement. Assessment & Plan Closed intertrochanteric fracture of right hip (HCC) Secondary to mechanical fall.  Frequent falls at home thought to be in the setting of BKA, uremia, deconditioning. Stable s/p fracture fixation on 11/22.  Reports that his pain is controlled this morning 11/24. Appreciate ongoing Ortho recs  Pain regimen Dilaudid 0.5-1 mg Q4h PRN Oxycodone 5-10 q4 PRN  Oxycodone 10-15 q4 PRN -Continue calcitriol 0.61mcg BID PT/OT eval and treat Fall precautions Acute kidney injury superimposed on CKD (HCC) Creatinine at known peak, good urine output yesterday.  Had dialysis yesterday for the first time and underwent vein mapping today.  Will be receiving HD today and tomorrow.  Potassium 5.1 today. - Appreciate ongoing Nephro recs Holding Farxiga for now Avoid nephrotoxic agents Strict I&Os Continue calcitriol and Renvela RFP daily Anemia Hemoglobin stable at 8.2 this morning.  Acute on chronic anemia during this admission suspected to be in the setting of anemia of chronic disease vs acute blood loss in the setting of fracture vs EPO  deficiency in setting of progressed CKD.  Transfusion threshold <7 Iron infusion on HD days x 5 doses Appreciate ongoing Nephro recs AM CBC Controlled diabetes mellitus type 2 with complications (HCC) A1c 6.1%.  Has insulin pump at home where he gets around 12 units/day, but it is removed currently.  CBGs have continued to decrease, last two were in the 100s. Very sensitive SSI CBGs ACHS Urinary retention Foley catheter now placed.  1.8L OP in last 24h.  -Strict I&Os -Foley care per nursing   Chronic and Stable Problems:  Hypertension: Continue home carvedilol 12.5 mg BID, amlodipine 5 mg daily, diltiazem 300 mg daily, hydralazine 100 mg BID HFpEF: Holding Lasix Nicotine dependence: Half pack a day, declines nicotine patch PAD: S/p right BKA, encourage nicotine cessation Neuropathy: Continue gabapentin 300mg  daily Hyperlipidemia: Continue atorvastatin 400mg  daily MDD: Continue fluoxetine 10 mg daily    FEN/GI: Carb modified PPx: Heparin with HD Dispo:Home pending clinical improvement   Subjective:  No acute events overnight.  Not currently in pain unless he moves.  No concerns, complaints, or questions.  Objective: Temp:  [97.5 F (36.4 C)-99 F (37.2 C)] 97.8 F (36.6 C) (11/24 1016) Pulse Rate:  [67-86] 70 (11/24 1016) Resp:  [16] 16 (11/24 0611) BP: (137-156)/(71-80) 152/80 (11/24 1016) SpO2:  [93 %-100 %] 100 % (11/24 1016) Weight:  [113.2 kg] 113.2 kg (11/24 0534) Physical Exam: General: No acute distress, does not make much eye contact and gives short responses. Cardiovascular: Regular rate and rhythm, no murmurs Respiratory: CTAB, no increased work of breathing on room air Abdomen: Nondistended Extremities: No erythema, warmth, swelling to right lower extremity.  Right BKA.  Left lower extremity no swelling, warmth, TTP. 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   Laboratory: Most recent CBC Lab Results  Component Value Date   WBC 11.4 (H) 10/17/2023   HGB 8.2 (L)  10/17/2023   HCT 26.3 (L) 10/17/2023   MCV 90.7 10/17/2023   PLT 150 10/17/2023   Most recent BMP    Latest Ref Rng & Units 10/17/2023    6:21 AM  BMP  Glucose 70 - 99 mg/dL 811   BUN 6 - 20 mg/dL 76   Creatinine 9.14 - 1.24 mg/dL 7.82   Sodium 956 - 213 mmol/L 135   Potassium 3.5 - 5.1 mmol/L 5.1   Chloride 98 - 111 mmol/L 103   CO2 22 - 32 mmol/L 19   Calcium 8.9 - 10.3 mg/dL 7.9    PTH pending  Javon Snee, DO 10/17/2023, 12:42 PM  PGY-1, [facility] FPTS Intern pager: [phone], text pages welcome Secure chat group [facility] Teaching Service

## 2023-10-17 NOTE — Plan of Care (Signed)
  Problem: Education: Goal: Ability to describe self-care measures that may prevent or decrease complications (Diabetes Survival Skills Education) will improve Outcome: Progressing Goal: Individualized Educational Video(s) Outcome: Progressing   

## 2023-10-17 NOTE — Assessment & Plan Note (Addendum)
Secondary to mechanical fall.  Frequent falls at home thought to be in the setting of BKA, uremia, deconditioning. Stable s/p fracture fixation on 11/22.  Reports that his pain is controlled this morning 11/24. Appreciate ongoing Ortho recs  Pain regimen Dilaudid 0.5-1 mg Q4h PRN Oxycodone 5-10 q4 PRN  Oxycodone 10-15 q4 PRN -Continue calcitriol 0.67mcg BID PT/OT eval and treat Fall precautions

## 2023-10-17 NOTE — Assessment & Plan Note (Signed)
Hemoglobin stable at 8.2 this morning.  Acute on chronic anemia during this admission suspected to be in the setting of anemia of chronic disease vs acute blood loss in the setting of fracture vs EPO deficiency in setting of progressed CKD.  Transfusion threshold <7 Iron infusion on HD days x 5 doses Appreciate ongoing Nephro recs AM CBC

## 2023-10-17 NOTE — Procedures (Signed)
Received patient in bed to unit.  Alert and oriented.  Informed consent signed and in chart.   TX duration:2.5 hours  Patient tolerated well.  Transported back to the room  Alert, without acute distress.  Hand-off given to patient's nurse.   Access used: RCVC Access issues: NONE  Total UF removed: 1 liter Medication(s) given: oxycodone 15 mg @ 1342  Post HD weight: 113.1 kg  Lu Duffel, RN Kidney Dialysis Unit

## 2023-10-17 NOTE — Assessment & Plan Note (Signed)
Foley catheter now placed.  1.8L OP in last 24h.  -Strict I&Os -Foley care per nursing

## 2023-10-17 NOTE — Progress Notes (Signed)
VASCULAR LAB    Upper extremity vein mapping has been performed.  See CV proc for preliminary results.   Kanylah Muench, RVT 10/17/2023, 9:18 AM

## 2023-10-17 NOTE — Assessment & Plan Note (Signed)
>>  ASSESSMENT AND PLAN FOR T2DM (TYPE 2 DIABETES MELLITUS) (HCC) WRITTEN ON 10/17/2023  8:41 AM BY EVERHART, KIRSTIE, DO  A1c 6.1%.  Has insulin  pump at home where he gets around 12 units/day, but it is removed currently.  CBGs have continued to decrease, last two were in the 100s. Very sensitive SSI CBGs ACHS

## 2023-10-17 NOTE — Progress Notes (Signed)
[name]  Assessment/ Plan: Pt is a 52 y.o. yo male with a history of DM 2, HTN, urinary retention, PAD (status post right BKA), HLD CKD5, presented after a fall and hip fracture.  # Fall/right intertrochanteric femur fracture status post ORIF/orthopedic surgery today.  Further management per the surgeon.  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  Agreed to start dialysis.  Status post right IJ TDC by IR on 11/22.  [facility] is following for permanent access.  First HD on 11/23, 2nd today. Need to contact renal navigator to arrange outpatient dialysis tomorrow. Will plan for 3 rd HD tomorrow.   # Anemia of CKD: Hemoglobin low with iron saturation of 8%.  Treating with IV iron and erythropoietin.  Monitor hemoglobin.  # HTN/volume: Continue current antihypertensives and pain management.  UF with HD.  Monitor BP.  # CKD-MBD/hyperphosphatemia: Continue sevelamer, pending PTH level.  Phosphorus level expect to improve after dialysis.   Subjective: Seen and examined at the bedside.  No new event.  Denies nausea, vomiting, chest pain or shortness of breath.  Tolerated dialysis well. Objective Vital signs in last 24 hours: Vitals:   10/17/23 0046 10/17/23 0534 10/17/23 0611 10/17/23 1016  BP: (!) 141/78  (!) 153/76 (!) 152/80  Pulse: 67  71 70  Resp: 16  16   Temp: 97.8 F (36.6 C)  (!) 97.5 F (36.4 C) 97.8 F (36.6 C)  TempSrc: Oral  Oral Oral  SpO2: 98%  96% 100%  Weight:  113.2 kg    Height:       Weight change: -3.8 kg  Intake/Output Summary (Last 24 hours) at 10/17/2023 1042 Last data filed at 10/17/2023 0535 Gross per 24 hour  Intake 670 ml  Output 2550 ml  Net -1880 ml       Labs: RENAL PANEL Recent Labs  Lab 10/13/23 1948 10/14/23 0324 10/14/23 0325 10/14/23 1616 10/15/23 0733 10/16/23 0527 10/17/23 0621  NA 140 139  --   --  136 135 135  K 4.9 4.9  --   --  5.1 5.5* 5.1  CL 114* 113*  --   --   110 106 103  CO2 17* 17*  --   --  16* 16* 19*  GLUCOSE 216* 120*  --   --  140* 261* 138*  BUN 78* 76*  --   --  81* 90* 76*  CREATININE 9.53* 10.16*  --   --  10.40* 11.23* 9.56*  CALCIUM 7.8* 7.5*  --   --  7.6* 7.9* 7.9*  PHOS  --  6.3*  --  5.9* 6.6*  --  7.9*  ALBUMIN  --  2.4* 2.3*  --  2.2*  --  2.2*    Liver Function Tests: Recent Labs  Lab 10/14/23 0325 10/15/23 0733 10/17/23 0621  AST 8*  --   --   ALT 10  --   --   ALKPHOS 50  --   --   BILITOT 0.6  --   --   PROT 5.2*  --   --   ALBUMIN 2.3* 2.2* 2.2*   Recent Labs  Lab 10/13/23 2008  LIPASE 50   No results for input(s): "AMMONIA" in the last 168 hours. CBC: Recent Labs    07/01/23 1223 07/01/23 1228 07/29/23 1155 07/29/23 1203 08/26/23 1253 08/26/23 1300 10/07/23 1217 10/07/23 1219 10/14/23 0325 10/14/23 1348 10/15/23 1747 10/16/23 0527 10/16/23 1410 10/16/23 1936 10/17/23 1610  HGB  --    < >  --    < >  --    < >  --    < > 6.3*   < > 8.1* 7.2* 6.8* 7.4* 8.2*  MCV  --   --   --   --   --   --   --    < > 92.6   < > 92.3 91.8 90.9  --  90.7  FERRITIN 14*  --  17*  --  24  --  50  --  39  --   --   --   --   --   --   TIBC 349  --  354  --  305  --  260  --  225*  --   --   --   --   --   --   IRON 76  --  91  --  126  --  79  --  19*  --   --   --   --   --   --    < > = values in this interval not displayed.    Cardiac Enzymes: No results for input(s): "CKTOTAL", "CKMB", "CKMBINDEX", "TROPONINI" in the last 168 hours. CBG: Recent Labs  Lab 10/16/23 1255 10/16/23 1609 10/16/23 1935 10/16/23 2347 10/17/23 0727  GLUCAP 226* 266* 249* 176* 130*    Iron Studies:  No results for input(s): "IRON", "TIBC", "TRANSFERRIN", "FERRITIN" in the last 72 hours.  Studies/Results: VAS [place] UPPER EXT VEIN MAPPING (PRE-OP AVF)  Result Date: 10/17/2023 UPPER EXTREMITY VEIN MAPPING Patient Name:  [name] [name]  Date of Exam:   10/17/2023 Medical Rec #: [id number]           Accession #:     [id number] Date of Birth: 1971/03/08           Patient Gender: M Patient Age:   74 years Exam Location:  [facility] Procedure:      VAS [place] UPPER EXT VEIN MAPPING (PRE-OP AVF) Referring Phys: [facility] MCCLUNG --------------------------------------------------------------------------------  Indications: Pre-access. History: ESRD.  Limitations: Significant interstitial edema in the bilateral upper extremities.              IV placement and multiple bandages right forearm. Comparison Study: Prior upper extremity vein mapping done 10/19/22 indicated                   mostly small veins with a marginal left basilic vein. Performing Technologist: Sherren Kerns RVS  Examination Guidelines: A complete evaluation includes B-mode imaging, spectral Doppler, color Doppler, and power Doppler as needed of all accessible portions of each vessel. Bilateral testing is considered an integral part of a complete examination. Limited examinations for reoccurring indications may be performed as noted. +-----------------+-------------+----------+---------+ Right Cephalic   Diameter (cm)Depth (cm)Findings  +-----------------+-------------+----------+---------+ Prox upper arm       0.27        0.56             +-----------------+-------------+----------+---------+ Mid upper arm        0.29        0.89             +-----------------+-------------+----------+---------+ Dist upper arm       0.31        0.79             +-----------------+-------------+----------+---------+ Antecubital fossa    0.35        0.48  IV     +-----------------+-------------+----------+---------+ Prox forearm         0.13        0.50             +-----------------+-------------+----------+---------+ Mid forearm          0.22        0.67             +-----------------+-------------+----------+---------+ Dist forearm         0.17        0.40             +-----------------+-------------+----------+---------+ Wrist               0.15/012   0.20/0.14 branching +-----------------+-------------+----------+---------+ +-----------------+-------------+----------+--------------+ Right Basilic    Diameter (cm)Depth (cm)   Findings    +-----------------+-------------+----------+--------------+ Mid upper arm        0.36        2.16       origin     +-----------------+-------------+----------+--------------+ Dist upper arm       0.24        1.72                  +-----------------+-------------+----------+--------------+ Antecubital fossa    0.20        1.43                  +-----------------+-------------+----------+--------------+ Prox forearm                            not visualized +-----------------+-------------+----------+--------------+ Mid forearm                             not visualized +-----------------+-------------+----------+--------------+ Distal forearm                          not visualized +-----------------+-------------+----------+--------------+ Wrist                                   not visualized +-----------------+-------------+----------+--------------+ +-----------------+-------------+----------+---------+ Left Cephalic    Diameter (cm)Depth (cm)Findings  +-----------------+-------------+----------+---------+ Prox upper arm       0.35        0.57             +-----------------+-------------+----------+---------+ Mid upper arm        0.27        0.70             +-----------------+-------------+----------+---------+ Dist upper arm       0.29        0.51             +-----------------+-------------+----------+---------+ Antecubital fossa    0.35        0.38             +-----------------+-------------+----------+---------+ Prox forearm       0.27/0.21  1.99/0.62 branching +-----------------+-------------+----------+---------+ Mid forearm          0.13        0.43             +-----------------+-------------+----------+---------+ Dist  forearm         0.13        0.31             +-----------------+-------------+----------+---------+ Wrist                0.16  0.25             +-----------------+-------------+----------+---------+ +-----------------+-------------+----------+--------+ Left Basilic     Diameter (cm)Depth (cm)Findings +-----------------+-------------+----------+--------+ Prox upper arm       0.33        1.16            +-----------------+-------------+----------+--------+ Mid upper arm        0.30        1.25            +-----------------+-------------+----------+--------+ Dist upper arm       0.33        1.34            +-----------------+-------------+----------+--------+ Antecubital fossa    0.26        1.30            +-----------------+-------------+----------+--------+ Prox forearm         0.17        0.64            +-----------------+-------------+----------+--------+ Mid forearm          0.19        0.51            +-----------------+-------------+----------+--------+ Distal forearm       0.19        0.64            +-----------------+-------------+----------+--------+ Wrist                0.15        0.73            +-----------------+-------------+----------+--------+ *See table(s) above for measurements and observations.  Diagnosing physician:    Preliminary    DG HIP PORT UNILAT W OR W/O PELVIS 1V RIGHT  Result Date: 10/15/2023 CLINICAL DATA:  Fracture, postop. EXAM: DG HIP (WITH OR WITHOUT PELVIS) 1V PORT RIGHT COMPARISON:  Preoperative imaging. FINDINGS: Femoral intramedullary nail with trans trochanteric and distal locking screw fixation traverse intertrochanteric femur fracture. Improved fracture alignment from preoperative imaging. Recent postsurgical change includes air and edema in the soft tissues. IMPRESSION: ORIF of intertrochanteric femur fracture. Electronically Signed   By: Narda Rutherford M.D.   On: 10/15/2023 23:08   IR Fluoro Guide CV  Line Right  Result Date: 10/15/2023 INDICATION: 52 year old male in need of hemodialysis. He presents for tunneled dialysis catheter placement. EXAM: TUNNELED CENTRAL VENOUS HEMODIALYSIS CATHETER PLACEMENT WITH ULTRASOUND AND FLUOROSCOPIC GUIDANCE MEDICATIONS: Intravenous Ancef was administered earlier today as an inpatient. ANESTHESIA/SEDATION: Moderate (conscious) sedation was employed during this procedure. A total of Versed 1 mg and Fentanyl 50 mcg was administered intravenously by the Radiology nurse. Moderate Sedation Time: 12 minutes. The patient's level of consciousness and vital signs were monitored continuously by radiology nursing throughout the procedure under my direct supervision. FLUOROSCOPY TIME:  Radiation exposure index: 2 mGy reference air kerma COMPLICATIONS: None immediate. PROCEDURE: Informed written consent was obtained from the patient after a discussion of the risks, benefits, and alternatives to treatment. Questions regarding the procedure were encouraged and answered. The right neck and chest were prepped with chlorhexidine in a sterile fashion, and a sterile drape was applied covering the operative field. Maximum barrier sterile technique with sterile gowns and gloves were used for the procedure. A timeout was performed prior to the initiation of the procedure. After creating a small venotomy incision, a micropuncture kit was utilized to access the right internal jugular vein under direct, real-time ultrasound guidance after the overlying soft tissues were anesthetized with 1% lidocaine with epinephrine.  Ultrasound image documentation was performed. The microwire was kinked to measure appropriate catheter length. A stiff Glidewire was advanced to the level of the IVC and the micropuncture sheath was exchanged for a peel-away sheath. A Palindrome tunneled hemodialysis catheter measuring 23 cm from tip to cuff was tunneled in a retrograde fashion from the anterior chest wall to the  venotomy incision. The catheter was then placed through the peel-away sheath with tips ultimately positioned within the superior aspect of the right atrium. Final catheter positioning was confirmed and documented with a spot radiographic image. The catheter aspirates and flushes normally. The catheter was flushed with appropriate volume heparin dwells. The catheter exit site was secured with a 0-Prolene retention suture. The venotomy incision was closed with an interrupted 4-0 Vicryl, Dermabond and Steri-strips. Dressings were applied. The patient tolerated the procedure well without immediate post procedural complication. IMPRESSION: Successful placement of 23 cm tip to cuff tunneled hemodialysis catheter via the right internal jugular vein with tips terminating within the superior aspect of the right atrium. The catheter is ready for immediate use. Electronically Signed   By: Malachy Moan M.D.   On: 10/15/2023 15:39   IR US Guide Vasc Access Right  Result Date: 10/15/2023 INDICATION: 52 year old male in need of hemodialysis. He presents for tunneled dialysis catheter placement. EXAM: TUNNELED CENTRAL VENOUS HEMODIALYSIS CATHETER PLACEMENT WITH ULTRASOUND AND FLUOROSCOPIC GUIDANCE MEDICATIONS: Intravenous Ancef was administered earlier today as an inpatient. ANESTHESIA/SEDATION: Moderate (conscious) sedation was employed during this procedure. A total of Versed 1 mg and Fentanyl 50 mcg was administered intravenously by the Radiology nurse. Moderate Sedation Time: 12 minutes. The patient's level of consciousness and vital signs were monitored continuously by radiology nursing throughout the procedure under my direct supervision. FLUOROSCOPY TIME:  Radiation exposure index: 2 mGy reference air kerma COMPLICATIONS: None immediate. PROCEDURE: Informed written consent was obtained from the patient after a discussion of the risks, benefits, and alternatives to treatment. Questions regarding the procedure were  encouraged and answered. The right neck and chest were prepped with chlorhexidine in a sterile fashion, and a sterile drape was applied covering the operative field. Maximum barrier sterile technique with sterile gowns and gloves were used for the procedure. A timeout was performed prior to the initiation of the procedure. After creating a small venotomy incision, a micropuncture kit was utilized to access the right internal jugular vein under direct, real-time ultrasound guidance after the overlying soft tissues were anesthetized with 1% lidocaine with epinephrine. Ultrasound image documentation was performed. The microwire was kinked to measure appropriate catheter length. A stiff Glidewire was advanced to the level of the IVC and the micropuncture sheath was exchanged for a peel-away sheath. A Palindrome tunneled hemodialysis catheter measuring 23 cm from tip to cuff was tunneled in a retrograde fashion from the anterior chest wall to the venotomy incision. The catheter was then placed through the peel-away sheath with tips ultimately positioned within the superior aspect of the right atrium. Final catheter positioning was confirmed and documented with a spot radiographic image. The catheter aspirates and flushes normally. The catheter was flushed with appropriate volume heparin dwells. The catheter exit site was secured with a 0-Prolene retention suture. The venotomy incision was closed with an interrupted 4-0 Vicryl, Dermabond and Steri-strips. Dressings were applied. The patient tolerated the procedure well without immediate post procedural complication. IMPRESSION: Successful placement of 23 cm tip to cuff tunneled hemodialysis catheter via the right internal jugular vein with tips terminating within the superior aspect of the  right atrium. The catheter is ready for immediate use. Electronically Signed   By: Malachy Moan M.D.   On: 10/15/2023 15:39   DG HIP UNILAT WITH PELVIS 2-3 VIEWS RIGHT  Result  Date: 10/15/2023 CLINICAL DATA:  Elective surgery. EXAM: DG HIP (WITH OR WITHOUT PELVIS) 2-3V RIGHT COMPARISON:  Preoperative imaging. FINDINGS: Five fluoroscopic spot views of the right hip obtained in the operating room. Femoral intramedullary nail with trans trochanteric and distal locking screw fixation of proximal femur fracture. Fluoroscopy time 1 minutes 18 seconds. Dose 17.02 mGy. IMPRESSION: Intraoperative fluoroscopy during proximal femur fracture fixation. Electronically Signed   By: Narda Rutherford M.D.   On: 10/15/2023 11:27   DG C-Arm 1-60 Min-No Report  Result Date: 10/15/2023 Fluoroscopy was utilized by the requesting physician.  No radiographic interpretation.    Medications: Infusions:  iron sucrose 100 mg (10/16/23 1412)    Scheduled Medications:  acetaminophen  650 mg Oral Q6H   amLODipine  5 mg Oral Daily   aspirin  325 mg Oral Daily   atorvastatin  40 mg Oral QHS   baclofen  10 mg Oral Daily   calcitRIOL  0.5 mcg Oral Daily   carvedilol  12.5 mg Oral BID WC   Chlorhexidine Gluconate Cloth  6 each Topical Q0600   Chlorhexidine Gluconate Cloth  6 each Topical Q0600   Chlorhexidine Gluconate Cloth  6 each Topical Q0600   darbepoetin (ARANESP) injection - DIALYSIS  100 mcg Subcutaneous Q Fri-1800   diltiazem  300 mg Oral Daily   docusate sodium  100 mg Oral BID   FLUoxetine  10 mg Oral Daily   gabapentin  300 mg Oral Daily   hydrALAZINE  100 mg Oral BID   influenza vac split trivalent PF  0.5 mL Intramuscular Tomorrow-1000   insulin aspart  0-6 Units Subcutaneous TID WC   mupirocin ointment  1 Application Nasal BID   sevelamer carbonate  800 mg Oral TID WC   sodium chloride flush  3 mL Intravenous Q12H   Vitamin D (Ergocalciferol)  50,000 Units Oral Q7 days    have reviewed scheduled and prn medications.  Physical Exam: General: Alert awake but sluggish, able to lie flat. Heart:RRR, s1s2 nl Lungs:clear b/l, no crackle Abdomen:soft, Non-tender,  non-distended Extremities:No edema, right BKA Dialysis Access: Right IJ TDC in place, site clean  Kaleiyah Polsky Prasad Clint Biello 10/17/2023,10:42 AM  LOS: 4 days

## 2023-10-18 ENCOUNTER — Encounter (HOSPITAL_COMMUNITY): Payer: Self-pay | Admitting: Student

## 2023-10-18 DIAGNOSIS — N185 Chronic kidney disease, stage 5: Secondary | ICD-10-CM | POA: Diagnosis not present

## 2023-10-18 DIAGNOSIS — Z95828 Presence of other vascular implants and grafts: Secondary | ICD-10-CM

## 2023-10-18 DIAGNOSIS — Z9889 Other specified postprocedural states: Secondary | ICD-10-CM | POA: Diagnosis not present

## 2023-10-18 DIAGNOSIS — S72001A Fracture of unspecified part of neck of right femur, initial encounter for closed fracture: Secondary | ICD-10-CM | POA: Diagnosis not present

## 2023-10-18 LAB — RENAL FUNCTION PANEL
Albumin: 2.2 g/dL — ABNORMAL LOW (ref 3.5–5.0)
Anion gap: 10 (ref 5–15)
BUN: 59 mg/dL — ABNORMAL HIGH (ref 6–20)
CO2: 23 mmol/L (ref 22–32)
Calcium: 7.8 mg/dL — ABNORMAL LOW (ref 8.9–10.3)
Chloride: 101 mmol/L (ref 98–111)
Creatinine, Ser: 7.9 mg/dL — ABNORMAL HIGH (ref 0.61–1.24)
GFR, Estimated: 8 mL/min — ABNORMAL LOW (ref >60)
Glucose, Bld: 116 mg/dL — ABNORMAL HIGH (ref 70–99)
Phosphorus: 6.8 mg/dL — ABNORMAL HIGH (ref 2.5–4.6)
Potassium: 4.7 mmol/L (ref 3.5–5.1)
Sodium: 134 mmol/L — ABNORMAL LOW (ref 135–145)

## 2023-10-18 LAB — GLUCOSE, CAPILLARY
Glucose-Capillary: 103 mg/dL — ABNORMAL HIGH (ref 70–99)
Glucose-Capillary: 110 mg/dL — ABNORMAL HIGH (ref 70–99)
Glucose-Capillary: 116 mg/dL — ABNORMAL HIGH (ref 70–99)
Glucose-Capillary: 130 mg/dL — ABNORMAL HIGH (ref 70–99)
Glucose-Capillary: 134 mg/dL — ABNORMAL HIGH (ref 70–99)
Glucose-Capillary: 164 mg/dL — ABNORMAL HIGH (ref 70–99)
Glucose-Capillary: 171 mg/dL — ABNORMAL HIGH (ref 70–99)
Glucose-Capillary: 180 mg/dL — ABNORMAL HIGH (ref 70–99)

## 2023-10-18 LAB — PARATHYROID HORMONE, INTACT (NO CA): PTH: 29 pg/mL (ref 15–65)

## 2023-10-18 LAB — CBC
HCT: 26.1 % — ABNORMAL LOW (ref 39.0–52.0)
Hemoglobin: 8.3 g/dL — ABNORMAL LOW (ref 13.0–17.0)
MCH: 28.7 pg (ref 26.0–34.0)
MCHC: 31.8 g/dL (ref 30.0–36.0)
MCV: 90.3 fL (ref 80.0–100.0)
Platelets: 149 10*3/uL — ABNORMAL LOW (ref 150–400)
RBC: 2.89 MIL/uL — ABNORMAL LOW (ref 4.22–5.81)
RDW: 16 % — ABNORMAL HIGH (ref 11.5–15.5)
WBC: 8.1 10*3/uL (ref 4.0–10.5)
nRBC: 0 % (ref 0.0–0.2)

## 2023-10-18 MED ORDER — SODIUM CHLORIDE 0.9 % IV SOLN
1.5000 g | INTRAVENOUS | Status: AC
  Administered 2023-10-19: 1.5 g via INTRAVENOUS
  Filled 2023-10-18: qty 1.5

## 2023-10-18 MED ORDER — SEVELAMER CARBONATE 800 MG PO TABS
1600.0000 mg | ORAL_TABLET | Freq: Three times a day (TID) | ORAL | Status: DC
  Administered 2023-10-18 – 2023-10-26 (×20): 1600 mg via ORAL
  Filled 2023-10-18 (×21): qty 2

## 2023-10-18 NOTE — Assessment & Plan Note (Addendum)
Creatinine at known peak, good urine output yesterday.  Had dialysis yesterday for the first time and underwent vein mapping today.  Will be receiving HD today. - Appreciate ongoing Nephro recs -Holding Farxiga for now -Avoid nephrotoxic agents -Strict I&Os -Continue calcitriol and Renvela -RFP daily

## 2023-10-18 NOTE — Assessment & Plan Note (Addendum)
Secondary to mechanical fall.  Frequent falls at home thought to be in the setting of BKA, uremia, deconditioning. Stable s/p fracture fixation on 11/22.  Reports that his pain is controlled this morning 11/24. -Appreciate ongoing Ortho recs  -Dilaudid 0.5-1 mg Q4h PRN -Oxycodone 5-10 q4 PRN  -Oxycodone 10-15 q4 PRN -Continue calcitriol 0.64mcg BID -PT/OT eval and treat -Fall precautions

## 2023-10-18 NOTE — Progress Notes (Signed)
[facility] NEPHROLOGY PROGRESS NOTE  Assessment/ Plan: Pt is a 52 y.o. yo male with a history of DM 2, HTN, urinary retention, PAD (status post right BKA), HLD CKD5, presented after a fall and hip fracture.  # Fall/right intertrochanteric femur fracture status post IM nail/operative repair on 11/22.  Further management per orthopedic surgery.  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  Agreed to start dialysis.  Status post right IJ TDC by IR on 11/22.  First HD on 11/23 - Plan for HD today then next HD on 11/27 (Wed) and then anticipate transition to TTS schedule.   - I have reached out to HD SW to initiate CLIP process - [facility] has been consulted for permanent access. Vein mapping done  # Anemia of CKD and iron deficiency.  Iron saturation of 8%.  Treating with IV iron and aranesp 100 mcg every Friday   # HTN/volume: optimize volume with HD.  I have stopped amlodipine as he is on concurrent diltiazem.    # CKD-MBD/hyperphosphatemia: Increase sevelamer, ordered intact PTH.    Disposition - from a strictly renal standpoint continue inpatient monitoring; starting HD and awaiting an outpatient HD unit   Subjective:  He had 2.8 liters UOP over 11/24.  Last HD on 11/24 with 1 kg UF.  He has been on both amlodipine and dilt - states that his PCP added amlodipine on a recent visit.  He states he has been told that plan is for surgery for permanent dialysis access tomorrow.    Review of systems:  He has had nausea; has emesis bag at bedside x 2  Denies shortness of breath or chest pain     Objective Vital signs in last 24 hours: Vitals:   10/17/23 2045 10/18/23 0411 10/18/23 0500 10/18/23 0722  BP: (!) 157/80 (!) 169/82  (!) 163/84  Pulse: 81 79  75  Resp: 18   16  Temp: 98.7 F (37.1 C) 97.9 F (36.6 C)  98.2 F (36.8 C)  TempSrc: Oral Oral  Oral  SpO2: 95% 94%  96%  Weight:   111.3 kg   Height:       Weight change: 1.2 kg  Intake/Output  Summary (Last 24 hours) at 10/18/2023 1311 Last data filed at 10/18/2023 0900 Gross per 24 hour  Intake 453.09 ml  Output 3800 ml  Net -3346.91 ml      Labs: RENAL PANEL Recent Labs  Lab 10/14/23 0324 10/14/23 0325 10/14/23 1616 10/15/23 0733 10/16/23 0527 10/17/23 0621 10/18/23 0645  NA 139  --   --  136 135 135 134*  K 4.9  --   --  5.1 5.5* 5.1 4.7  CL 113*  --   --  110 106 103 101  CO2 17*  --   --  16* 16* 19* 23  GLUCOSE 120*  --   --  140* 261* 138* 116*  BUN 76*  --   --  81* 90* 76* 59*  CREATININE 10.16*  --   --  10.40* 11.23* 9.56* 7.90*  CALCIUM 7.5*  --   --  7.6* 7.9* 7.9* 7.8*  PHOS 6.3*  --  5.9* 6.6*  --  7.9* 6.8*  ALBUMIN 2.4* 2.3*  --  2.2*  --  2.2* 2.2*    Liver Function Tests: Recent Labs  Lab 10/14/23 0325 10/15/23 0733 10/17/23 0621 10/18/23 0645  AST 8*  --   --   --   ALT 10  --   --   --  ALKPHOS 50  --   --   --   BILITOT 0.6  --   --   --   PROT 5.2*  --   --   --   ALBUMIN 2.3* 2.2* 2.2* 2.2*   Recent Labs  Lab 10/13/23 2008  LIPASE 50   No results for input(s): "AMMONIA" in the last 168 hours. CBC: Recent Labs    07/01/23 1223 07/01/23 1228 07/29/23 1155 07/29/23 1203 08/26/23 1253 08/26/23 1300 10/07/23 1217 10/07/23 1219 10/14/23 0325 10/14/23 1348 10/16/23 0527 10/16/23 1410 10/16/23 1936 10/17/23 0621 10/18/23 0645  HGB  --    < >  --    < >  --    < >  --    < > 6.3*   < > 7.2* 6.8* 7.4* 8.2* 8.3*  MCV  --   --   --   --   --   --   --    < > 92.6   < > 91.8 90.9  --  90.7 90.3  FERRITIN 14*  --  17*  --  24  --  50  --  39  --   --   --   --   --   --   TIBC 349  --  354  --  305  --  260  --  225*  --   --   --   --   --   --   IRON 76  --  91  --  126  --  79  --  19*  --   --   --   --   --   --    < > = values in this interval not displayed.    Cardiac Enzymes: No results for input(s): "CKTOTAL", "CKMB", "CKMBINDEX", "TROPONINI" in the last 168 hours. CBG: Recent Labs  Lab 10/17/23 2245  10/18/23 0128 10/18/23 0404 10/18/23 0720 10/18/23 1109  GLUCAP 120* 103* 110* 130* 171*    Iron Studies:  No results for input(s): "IRON", "TIBC", "TRANSFERRIN", "FERRITIN" in the last 72 hours.  Studies/Results: VAS [place] UPPER EXT VEIN MAPPING (PRE-OP AVF)  Result Date: 10/17/2023 UPPER EXTREMITY VEIN MAPPING Patient Name:  SHAYON RAMM  Date of Exam:   10/17/2023 Medical Rec #: [id number]           Accession #:    [id number] Date of Birth: [birth year]           Patient Gender: M Patient Age:   63 years Exam Location:  [facility] Procedure:      VAS [place] UPPER EXT VEIN MAPPING (PRE-OP AVF) Referring Phys: [facility] MCCLUNG --------------------------------------------------------------------------------  Indications: Pre-access. History: ESRD.  Limitations: Significant interstitial edema in the bilateral upper extremities.              IV placement and multiple bandages right forearm. Comparison Study: Prior upper extremity vein mapping done 10/19/22 indicated                   mostly small veins with a marginal left basilic vein. Performing Technologist: Sherren Kerns RVS  Examination Guidelines: A complete evaluation includes B-mode imaging, spectral Doppler, color Doppler, and power Doppler as needed of all accessible portions of each vessel. Bilateral testing is considered an integral part of a complete examination. Limited examinations for reoccurring indications may be performed as noted. +-----------------+-------------+----------+---------+ Right Cephalic   Diameter (cm)Depth (cm)Findings  +-----------------+-------------+----------+---------+ Prox upper arm  0.27        0.56             +-----------------+-------------+----------+---------+ Mid upper arm        0.29        0.89             +-----------------+-------------+----------+---------+ Dist upper arm       0.31        0.79             +-----------------+-------------+----------+---------+ Antecubital  fossa    0.35        0.48      IV     +-----------------+-------------+----------+---------+ Prox forearm         0.13        0.50             +-----------------+-------------+----------+---------+ Mid forearm          0.22        0.67             +-----------------+-------------+----------+---------+ Dist forearm         0.17        0.40             +-----------------+-------------+----------+---------+ Wrist              0.15/012   0.20/0.14 branching +-----------------+-------------+----------+---------+ +-----------------+-------------+----------+--------------+ Right Basilic    Diameter (cm)Depth (cm)   Findings    +-----------------+-------------+----------+--------------+ Mid upper arm        0.36        2.16       origin     +-----------------+-------------+----------+--------------+ Dist upper arm       0.24        1.72                  +-----------------+-------------+----------+--------------+ Antecubital fossa    0.20        1.43                  +-----------------+-------------+----------+--------------+ Prox forearm                            not visualized +-----------------+-------------+----------+--------------+ Mid forearm                             not visualized +-----------------+-------------+----------+--------------+ Distal forearm                          not visualized +-----------------+-------------+----------+--------------+ Wrist                                   not visualized +-----------------+-------------+----------+--------------+ +-----------------+-------------+----------+---------+ Left Cephalic    Diameter (cm)Depth (cm)Findings  +-----------------+-------------+----------+---------+ Prox upper arm       0.35        0.57             +-----------------+-------------+----------+---------+ Mid upper arm        0.27        0.70             +-----------------+-------------+----------+---------+ Dist  upper arm       0.29        0.51             +-----------------+-------------+----------+---------+ Antecubital fossa    0.35        0.38             +-----------------+-------------+----------+---------+  Prox forearm       0.27/0.21  1.99/0.62 branching +-----------------+-------------+----------+---------+ Mid forearm          0.13        0.43             +-----------------+-------------+----------+---------+ Dist forearm         0.13        0.31             +-----------------+-------------+----------+---------+ Wrist                0.16        0.25             +-----------------+-------------+----------+---------+ +-----------------+-------------+----------+--------+ Left Basilic     Diameter (cm)Depth (cm)Findings +-----------------+-------------+----------+--------+ Prox upper arm       0.33        1.16            +-----------------+-------------+----------+--------+ Mid upper arm        0.30        1.25            +-----------------+-------------+----------+--------+ Dist upper arm       0.33        1.34            +-----------------+-------------+----------+--------+ Antecubital fossa    0.26        1.30            +-----------------+-------------+----------+--------+ Prox forearm         0.17        0.64            +-----------------+-------------+----------+--------+ Mid forearm          0.19        0.51            +-----------------+-------------+----------+--------+ Distal forearm       0.19        0.64            +-----------------+-------------+----------+--------+ Wrist                0.15        0.73            +-----------------+-------------+----------+--------+ *See table(s) above for measurements and observations.  Diagnosing physician: Gerarda Fraction Electronically signed by Gerarda Fraction on 10/17/2023 at 12:34:56 PM.    Final     Medications: Infusions:  [START ON 10/19/2023] cefUROXime (ZINACEF)  IV     iron sucrose  Stopped (10/17/23 1620)    Scheduled Medications:  acetaminophen  650 mg Oral Q6H   amLODipine  5 mg Oral Daily   aspirin  325 mg Oral Daily   atorvastatin  40 mg Oral QHS   baclofen  10 mg Oral Daily   calcitRIOL  0.5 mcg Oral Daily   carvedilol  12.5 mg Oral BID WC   Chlorhexidine Gluconate Cloth  6 each Topical Q0600   darbepoetin (ARANESP) injection - DIALYSIS  100 mcg Subcutaneous Q Fri-1800   diltiazem  300 mg Oral Daily   docusate sodium  100 mg Oral BID   FLUoxetine  10 mg Oral Daily   gabapentin  300 mg Oral Daily   hydrALAZINE  100 mg Oral BID   influenza vac split trivalent PF  0.5 mL Intramuscular Tomorrow-1000   insulin aspart  0-6 Units Subcutaneous TID WC   mupirocin ointment  1 Application Nasal BID   sevelamer carbonate  800 mg Oral TID WC   sodium chloride flush  3 mL Intravenous Q12H  Vitamin D (Ergocalciferol)  50,000 Units Oral Q7 days    have reviewed scheduled and prn medications.  Physical Exam:  General adult male in bed in no acute distress HEENT normocephalic atraumatic extraocular movements intact sclera anicteric Neck supple trachea midline Lungs clear to auscultation bilaterally normal work of breathing at rest on room air Heart S1S2 no rub Abdomen soft nontender nondistended Extremities right BKA; no edema  Psych blunted affect - usual for him Neuro alert and oriented x 3 provides hx and follows commands Access RIJ tunn catheter   [name] [name] 10/18/2023,1:35 PM  LOS: 5 days

## 2023-10-18 NOTE — Assessment & Plan Note (Addendum)
Foley catheter now placed.  2.8L OP in last 24h.  -Strict I&Os -Foley care per nursing

## 2023-10-18 NOTE — Assessment & Plan Note (Addendum)
Stable, Hemoglobin 8.3 this morning.  Acute on chronic anemia during this admission suspected to be in the setting of anemia of chronic disease vs acute blood loss in the setting of fracture vs EPO deficiency in setting of progressed CKD.  -Transfusion threshold <7 -Iron infusion on HD days x 5 doses -Appreciate ongoing Nephro recs -AM CBC

## 2023-10-18 NOTE — Progress Notes (Signed)
Right hand dominant  TDC placed by IR 10/15/23 ESRD Plan for OR 10/19/23 for left UE AV fistula verses graft. NPO past MN order placed on chart as well as consent Patient agrees to proceed   +-----------------+-------------+----------+---------+  Right Cephalic   Diameter (cm)Depth (cm)Findings   +-----------------+-------------+----------+---------+  Prox upper arm       0.27        0.56              +-----------------+-------------+----------+---------+  Mid upper arm        0.29        0.89              +-----------------+-------------+----------+---------+  Dist upper arm       0.31        0.79              +-----------------+-------------+----------+---------+  Antecubital fossa    0.35        0.48      IV      +-----------------+-------------+----------+---------+  Prox forearm         0.13        0.50              +-----------------+-------------+----------+---------+  Mid forearm          0.22        0.67              +-----------------+-------------+----------+---------+  Dist forearm         0.17        0.40              +-----------------+-------------+----------+---------+  Wrist             0.15/012   0.20/0.14 branching  +-----------------+-------------+----------+---------+   +-----------------+-------------+----------+--------------+  Right Basilic    Diameter (cm)Depth (cm)   Findings     +-----------------+-------------+----------+--------------+  Mid upper arm        0.36        2.16       origin      +-----------------+-------------+----------+--------------+  Dist upper arm       0.24        1.72                   +-----------------+-------------+----------+--------------+  Antecubital fossa    0.20        1.43                   +-----------------+-------------+----------+--------------+  Prox forearm                            not visualized   +-----------------+-------------+----------+--------------+  Mid forearm                             not visualized  +-----------------+-------------+----------+--------------+  Distal forearm                          not visualized  +-----------------+-------------+----------+--------------+  Wrist                                  not visualized  +-----------------+-------------+----------+--------------+   +-----------------+-------------+----------+---------+  Left Cephalic    Diameter (cm)Depth (cm)Findings   +-----------------+-------------+----------+---------+  Prox upper arm       0.35  0.57              +-----------------+-------------+----------+---------+  Mid upper arm        0.27        0.70              +-----------------+-------------+----------+---------+  Dist upper arm       0.29        0.51              +-----------------+-------------+----------+---------+  Antecubital fossa    0.35        0.38              +-----------------+-------------+----------+---------+  Prox forearm       0.27/0.21  1.99/0.62 branching  +-----------------+-------------+----------+---------+  Mid forearm          0.13        0.43              +-----------------+-------------+----------+---------+  Dist forearm         0.13        0.31              +-----------------+-------------+----------+---------+  Wrist               0.16        0.25              +-----------------+-------------+----------+---------+   +-----------------+-------------+----------+--------+  Left Basilic     Diameter (cm)Depth (cm)Findings  +-----------------+-------------+----------+--------+  Prox upper arm       0.33        1.16             +-----------------+-------------+----------+--------+  Mid upper arm        0.30        1.25             +-----------------+-------------+----------+--------+  Dist upper arm       0.33         1.34             +-----------------+-------------+----------+--------+  Antecubital fossa    0.26        1.30             +-----------------+-------------+----------+--------+  Prox forearm         0.17        0.64             +-----------------+-------------+----------+--------+  Mid forearm          0.19        0.51             +-----------------+-------------+----------+--------+  Distal forearm       0.19        0.64             +-----------------+-------------+----------+--------+  Wrist               0.15        0.73             +-----------------+-------------+----------+--------+

## 2023-10-18 NOTE — Assessment & Plan Note (Addendum)
A1c 6.1%.  Has insulin pump at home where he gets around 12 units/day, but it is removed currently.  CBGs have continued to decrease, last two were in the 100s. -Very sensitive SSI -CBGs ACHS

## 2023-10-18 NOTE — Progress Notes (Signed)
Daily Progress Note Intern Pager: [phone]  Patient name: [name] [name] Medical record number: [id number] Date of birth: 1971/10/02 Age: 52 y.o. Gender: male  Primary Care Provider: [name], MD Consultants: Orthopedic Surgery, Nephrology, Interventional Radiology  Code Status: Full   Pt Overview and Major Events to Date:  11/20 - Admitted to FMTS 11/21 - 2 units PRBC transfusion 11/22 - Surgical fixation of fracture, TDC placement per IR 11/23 - First HD session  Assessment and Plan:  52 y.o. male with a pertinent PMH of frequent falls, CKD stage IV, MDD, R BKA, T2DM, and PAD who presented with R intertrochanteric proximal femur fracture and was admitted for fixation of fracture and falls workup. Now s/p fracture fixation on 11/22 and [facility] placement on 11/22.  Patient had first HD 11/23. Following nephrology/ortho recommendations and will need SNF placement Assessment & Plan Closed intertrochanteric fracture of right hip (HCC) Secondary to mechanical fall.  Frequent falls at home thought to be in the setting of BKA, uremia, deconditioning. Stable s/p fracture fixation on 11/22.  Reports that his pain is controlled this morning 11/24. -Appreciate ongoing Ortho recs  -Dilaudid 0.5-1 mg Q4h PRN -Oxycodone 5-10 q4 PRN  -Oxycodone 10-15 q4 PRN -Continue calcitriol 0.43mcg BID -PT/OT eval and treat -Fall precautions Acute kidney injury superimposed on CKD (HCC) Creatinine at known peak, good urine output yesterday.  Had dialysis yesterday for the first time and underwent vein mapping today.  Will be receiving HD today. - Appreciate ongoing Nephro recs -Holding Farxiga for now -Avoid nephrotoxic agents -Strict I&Os -Continue calcitriol and Renvela -RFP daily Anemia Stable, Hemoglobin 8.3 this morning.  Acute on chronic anemia during this admission suspected to be in the setting of anemia of chronic disease vs acute blood loss in the setting of fracture vs EPO  deficiency in setting of progressed CKD.  -Transfusion threshold <7 -Iron infusion on HD days x 5 doses -Appreciate ongoing Nephro recs -AM CBC Controlled diabetes mellitus type 2 with complications (HCC) A1c 6.1%.  Has insulin pump at home where he gets around 12 units/day, but it is removed currently.  CBGs have continued to decrease, last two were in the 100s. -Very sensitive SSI -CBGs ACHS Urinary retention Foley catheter now placed.  2.8L OP in last 24h.  -Strict I&Os -Foley care per nursing   Chronic and Stable Problems:  Hypertension: Continue home carvedilol 12.5 mg BID, amlodipine 5 mg daily, diltiazem 300 mg daily, hydralazine 100 mg BID HFpEF: Holding Lasix Nicotine dependence: Half pack a day, declines nicotine patch PAD: S/p right BKA, encourage nicotine cessation Neuropathy: Continue gabapentin 300mg  daily Hyperlipidemia: Continue atorvastatin 400mg  daily MDD: Continue fluoxetine 10 mg daily    FEN/GI: carb modified PPx: aspirin  Dispo:SNF pending clinical improvement   Subjective:  No acute events overnight. Complains of R hip pain, not more severe than prior.   Objective: Temp:  [97.6 F (36.4 C)-98.7 F (37.1 C)] 98.2 F (36.8 C) (11/25 0722) Pulse Rate:  [61-81] 75 (11/25 0722) Resp:  [8-21] 16 (11/25 0722) BP: (130-169)/(70-84) 163/84 (11/25 0722) SpO2:  [92 %-100 %] 96 % (11/25 0722) Weight:  [111.3 kg-114.4 kg] 111.3 kg (11/25 0500) Physical Exam: General: No acute distress, eating breakfast Cardiovascular: RRR, no murmurs Respiratory: CTAB, no increased WOB on RA Extremities: No overlying redness or warmth to R hip. No increased swelling LLE. 2+ PT/DP pulses LLE.  TDC catheter site C/D/I  Laboratory: Most recent CBC Lab Results  Component Value Date  WBC 8.1 10/18/2023   HGB 8.3 (L) 10/18/2023   HCT 26.1 (L) 10/18/2023   MCV 90.3 10/18/2023   PLT 149 (L) 10/18/2023   Most recent BMP    Latest Ref Rng & Units 10/17/2023    6:21 AM   BMP  Glucose 70 - 99 mg/dL 132   BUN 6 - 20 mg/dL 76   Creatinine 4.40 - 1.24 mg/dL 1.02   Sodium 725 - 366 mmol/L 135   Potassium 3.5 - 5.1 mmol/L 5.1   Chloride 98 - 111 mmol/L 103   CO2 22 - 32 mmol/L 19   Calcium 8.9 - 10.3 mg/dL 7.9     Taura Lamarre, DO 10/18/2023, 7:45 AM  PGY-1,  Family Medicine FPTS Intern pager: [phone], text pages welcome Secure chat group [facility] Teaching Service

## 2023-10-18 NOTE — Plan of Care (Signed)
Problem: Education: Goal: Ability to describe self-care measures that may prevent or decrease complications (Diabetes Survival Skills Education) will improve Outcome: Progressing Goal: Individualized Educational Video(s) Outcome: Progressing

## 2023-10-18 NOTE — Progress Notes (Signed)
  Daily Progress Note  Subjective: Feeling nauseated  Objective: Vitals:   10/18/23 1410 10/18/23 1430  BP: (!) 184/88 (!) 189/94  Pulse: 90 84  Resp: 14 14  Temp:    SpO2: 90% 93%    Physical Examination Gen: NAD CV: HDS Vascular: Palpable radial pulses bilaterally, RIJ TDC in palce  ASSESSMENT/PLAN:  52 year old right-handed male with CKD stage V.  He underwent tunnel line placement with IR on Friday and is in need of permanent dialysis access.  Reviewed the vein mapping looks great.  He does have adequate superficial veins on the left arm which we will plan to use.  Risk and benefits of AV fistula creation were reviewed with the patient and he elected to proceed.  Plan for left AVF creation tomorrow. N.p.o. midnight, consent ordered.  Daria Pastures MD [facility] [phone] 10/18/2023  3:01 PM

## 2023-10-18 NOTE — Progress Notes (Signed)
Requested to see pt for out-pt HD needs at d/c. Attempted to meet with pt at bedside but pt unavailable. Chart reviewed and noted therapy rec for snf. Contacted TOC staff to inquire about d/c plan. CSW plans to see pt to discuss d/c plans. Will need to clip pt to most appropriate clinic pending d/c plan. Will follow and assist with out-pt HD needs.   Olivia Canter Renal Navigator [phone]

## 2023-10-18 NOTE — Care Management Important Message (Signed)
Important Message  Patient Details  Name: [name] MRN: [id number] Date of Birth: 1971-08-05   Important Message Given:  Yes - Medicare IM     Sherilyn Banker 10/18/2023, 3:18 PM

## 2023-10-18 NOTE — Evaluation (Signed)
Physical Therapy Evaluation Patient Details Name: [name] [name] MRN: [id number] DOB: [birth year] Today's Date: 10/18/2023  History of Present Illness  Pt is a 52 y/o M admitted on 10/13/23 after presenting with R intertrochanteric fracture. Pt is s/p fixation on 10/15/23.  PMH: frequent falls, CKD IV, MDD, R BKA, DM2, PAD  Clinical Impression  Pt seen for PT evaluation with pt agreeable to tx. Pt with flat affect throughout session. Pt reports prior to admission he was ambulatory with cane but has had ~4 falls in the past 6 months. On this date, pt is able to complete supine<>sit with supervision with [facility] almost fully elevated & extra time to move BLE to EOB. Pt is limited by N&V while sitting EOB but demonstrates fair sitting balance. PT educated pt on importance of mobility & purpose of therapy. Will continue to follow pt acutely to progress mobility as able.      If plan is discharge home, recommend the following: A lot of help with walking and/or transfers;A lot of help with bathing/dressing/bathroom;Assist for transportation;Direct supervision/assist for financial management;Assistance with cooking/housework;Help with stairs or ramp for entrance   Can travel by private vehicle   No    Equipment Recommendations None recommended by PT  Recommendations for Other Services       Functional Status Assessment Patient has had a recent decline in their functional status and demonstrates the ability to make significant improvements in function in a reasonable and predictable amount of time.     Precautions / Restrictions Precautions Precautions: Fall Precaution Comments: RLE prosthesis (in room) Restrictions Weight Bearing Restrictions: Yes RLE Weight Bearing: Weight bearing as tolerated      Mobility  Bed Mobility Overal bed mobility: Needs Assistance Bed Mobility: Supine to Sit, Sit to Supine     Supine to sit: HOB elevated, Used rails Sit to supine: Supervision, Used  rails, HOB elevated   General bed mobility comments: HOB significantly elevated    Transfers                        Ambulation/Gait                  Stairs            Wheelchair Mobility     Tilt Bed    Modified Rankin (Stroke Patients Only)       Balance Overall balance assessment: History of Falls, Needs assistance Sitting-balance support: Bilateral upper extremity supported, Feet supported, No upper extremity supported Sitting balance-Leahy Scale: Fair Sitting balance - Comments: supervision static sitting EOB                                     Pertinent Vitals/Pain Pain Assessment Pain Assessment: No/denies pain    Home Living Family/patient expects to be discharged to:: Private residence Living Arrangements: Parent;Other relatives (sister & mother) Available Help at Discharge: Family;Available 24 hours/day (mother available 24 hrs/day but unsure level of physical assistance she can provide) Type of Home: House Home Access: Stairs to enter Entrance Stairs-Rails: Left;Right;Can reach both Entrance Stairs-Number of Steps: 3   Home Layout: One level Home Equipment: Wheelchair - Forensic psychologist (2 wheels);Tub bench;Grab bars - tub/shower;Hand held shower head;Cane - quad      Prior Function Prior Level of Function : Independent/Modified Independent;Driving             Mobility Comments: Pt  ambulatory with cane, ~4 falls in the past 6 months.       Extremity/Trunk Assessment   Upper Extremity Assessment Upper Extremity Assessment: LUE deficits/detail;Generalized weakness LUE Deficits / Details: pt appears to have muscle atrophy along dorsal aspect of L hand, reports this is 2/2 injury he received in the past in the hospital?    Lower Extremity Assessment Lower Extremity Assessment: RLE deficits/detail;LLE deficits/detail RLE Deficits / Details: RLE BKA LLE Deficits / Details: decreased skin integrity LLE        Communication   Communication Communication: No apparent difficulties  Cognition Arousal: Alert Behavior During Therapy: Flat affect                                   General Comments: Pt with flat affect, appears to answer question correctly but limited verbal engagement with PT.        General Comments      Exercises     Assessment/Plan    PT Assessment Patient needs continued PT services  PT Problem List Decreased strength;Decreased range of motion;Decreased activity tolerance;Decreased balance;Decreased mobility;Decreased safety awareness;Decreased knowledge of use of DME;Decreased knowledge of precautions;Decreased skin integrity       PT Treatment Interventions DME instruction;Balance training;Gait training;Neuromuscular re-education;Stair training;Functional mobility training;Therapeutic exercise;Therapeutic activities;Patient/family education;Modalities;Manual techniques    PT Goals (Current goals can be found in the Care Plan section)  Acute Rehab PT Goals Patient Stated Goal: none stated PT Goal Formulation: With patient Time For Goal Achievement: 11/01/23 Potential to Achieve Goals: Good    Frequency Min 1X/week     Co-evaluation               AM-PAC PT "6 Clicks" Mobility  Outcome Measure Help needed turning from your back to your side while in a flat bed without using bedrails?: None Help needed moving from lying on your back to sitting on the side of a flat bed without using bedrails?: A Little Help needed moving to and from a bed to a chair (including a wheelchair)?: A Lot Help needed standing up from a chair using your arms (e.g., wheelchair or bedside chair)?: A Lot Help needed to walk in hospital room?: Total Help needed climbing 3-5 steps with a railing? : Total 6 Click Score: 13    End of Session   Activity Tolerance: Other (comment) (limited 2/2 N&V) Patient left: in bed;with call bell/phone within reach;with bed  alarm set Nurse Communication: Mobility status PT Visit Diagnosis: Muscle weakness (generalized) (M62.81);Unsteadiness on feet (R26.81);Other abnormalities of gait and mobility (R26.89);History of falling (Z91.81);Difficulty in walking, not elsewhere classified (R26.2)    Time: 4403-4742 PT Time Calculation (min) (ACUTE ONLY): 13 min   Charges:   PT Evaluation $PT Eval Low Complexity: 1 Low   PT General Charges $$ ACUTE PT VISIT: 1 Visit         Aleda Grana, PT, DPT 10/18/23, 1:34 PM   Sandi Mariscal 10/18/2023, 1:31 PM

## 2023-10-18 NOTE — Assessment & Plan Note (Signed)
>>  ASSESSMENT AND PLAN FOR T2DM (TYPE 2 DIABETES MELLITUS) (HCC) WRITTEN ON 10/18/2023  7:51 AM BY EVERHART, KIRSTIE, DO  A1c 6.1%.  Has insulin  pump at home where he gets around 12 units/day, but it is removed currently.  CBGs have continued to decrease, last two were in the 100s. -Very sensitive SSI -CBGs ACHS

## 2023-10-18 NOTE — Progress Notes (Signed)
   10/18/23 1720  Vitals  Temp 97.7 F (36.5 C)  Pulse Rate 88  Resp 12  BP (!) 197/87  SpO2 95 %  Weight 107.9 kg  Type of Weight Post-Dialysis  Post Treatment  Dialyzer Clearance Clear  Hemodialysis Intake (mL) 0 mL  Liters Processed 45  Fluid Removed (mL) 1500 mL  Tolerated HD Treatment Yes   Received patient in bed to unit.  Alert and oriented.  Informed consent signed and in chart.   TX duration:3hrs  Patient tolerated well.  Transported back to the room  Alert, without acute distress.  Hand-off given to patient's nurse.   Access used: [facility] Access issues: none  Total UF removed: 1.5L Medication(s) given: none    [facility]

## 2023-10-19 ENCOUNTER — Other Ambulatory Visit: Payer: Self-pay

## 2023-10-19 ENCOUNTER — Encounter (HOSPITAL_COMMUNITY)
Admission: EM | Disposition: A | Discharge status: Skilled Nursing Facility | Payer: Self-pay | Source: Home / Self Care | Attending: Family Medicine

## 2023-10-19 ENCOUNTER — Inpatient Hospital Stay (HOSPITAL_COMMUNITY): Payer: 59 | Admitting: Registered Nurse

## 2023-10-19 ENCOUNTER — Other Ambulatory Visit (HOSPITAL_COMMUNITY): Payer: Self-pay | Admitting: *Deleted

## 2023-10-19 ENCOUNTER — Ambulatory Visit: Payer: 59 | Admitting: Physician Assistant

## 2023-10-19 ENCOUNTER — Encounter (HOSPITAL_COMMUNITY): Payer: Self-pay

## 2023-10-19 DIAGNOSIS — N186 End stage renal disease: Secondary | ICD-10-CM

## 2023-10-19 DIAGNOSIS — F17210 Nicotine dependence, cigarettes, uncomplicated: Secondary | ICD-10-CM

## 2023-10-19 DIAGNOSIS — Z794 Long term (current) use of insulin: Secondary | ICD-10-CM

## 2023-10-19 DIAGNOSIS — I120 Hypertensive chronic kidney disease with stage 5 chronic kidney disease or end stage renal disease: Secondary | ICD-10-CM

## 2023-10-19 DIAGNOSIS — S72001A Fracture of unspecified part of neck of right femur, initial encounter for closed fracture: Secondary | ICD-10-CM | POA: Diagnosis not present

## 2023-10-19 DIAGNOSIS — E1122 Type 2 diabetes mellitus with diabetic chronic kidney disease: Secondary | ICD-10-CM

## 2023-10-19 DIAGNOSIS — Z992 Dependence on renal dialysis: Secondary | ICD-10-CM | POA: Diagnosis not present

## 2023-10-19 DIAGNOSIS — Z9889 Other specified postprocedural states: Secondary | ICD-10-CM | POA: Diagnosis not present

## 2023-10-19 DIAGNOSIS — Z95828 Presence of other vascular implants and grafts: Secondary | ICD-10-CM | POA: Diagnosis not present

## 2023-10-19 HISTORY — PX: AV FISTULA PLACEMENT: SHX1204

## 2023-10-19 LAB — GLUCOSE, CAPILLARY
Glucose-Capillary: 112 mg/dL — ABNORMAL HIGH (ref 70–99)
Glucose-Capillary: 136 mg/dL — ABNORMAL HIGH (ref 70–99)
Glucose-Capillary: 112 mg/dL — ABNORMAL HIGH (ref 70–99)
Glucose-Capillary: 283 mg/dL — ABNORMAL HIGH (ref 70–99)
Glucose-Capillary: 272 mg/dL — ABNORMAL HIGH (ref 70–99)

## 2023-10-19 LAB — CBC
HCT: 26.2 % — ABNORMAL LOW (ref 39.0–52.0)
Hemoglobin: 8.2 g/dL — ABNORMAL LOW (ref 13.0–17.0)
MCH: 28.1 pg (ref 26.0–34.0)
MCHC: 31.3 g/dL (ref 30.0–36.0)
MCV: 89.7 fL (ref 80.0–100.0)
Platelets: 150 10*3/uL (ref 150–400)
RBC: 2.92 MIL/uL — ABNORMAL LOW (ref 4.22–5.81)
RDW: 15.8 % — ABNORMAL HIGH (ref 11.5–15.5)
WBC: 9.1 10*3/uL (ref 4.0–10.5)
nRBC: 0.2 % (ref 0.0–0.2)

## 2023-10-19 LAB — RENAL FUNCTION PANEL
Albumin: 2.2 g/dL — ABNORMAL LOW (ref 3.5–5.0)
Anion gap: 13 (ref 5–15)
BUN: 46 mg/dL — ABNORMAL HIGH (ref 6–20)
CO2: 23 mmol/L (ref 22–32)
Calcium: 8 mg/dL — ABNORMAL LOW (ref 8.9–10.3)
Chloride: 101 mmol/L (ref 98–111)
Creatinine, Ser: 6.26 mg/dL — ABNORMAL HIGH (ref 0.61–1.24)
GFR, Estimated: 10 mL/min — ABNORMAL LOW (ref >60)
Glucose, Bld: 108 mg/dL — ABNORMAL HIGH (ref 70–99)
Phosphorus: 6.1 mg/dL — ABNORMAL HIGH (ref 2.5–4.6)
Potassium: 4.3 mmol/L (ref 3.5–5.1)
Sodium: 137 mmol/L (ref 135–145)

## 2023-10-19 SURGERY — ARTERIOVENOUS (AV) FISTULA CREATION
Anesthesia: General | Site: Arm Upper | Laterality: Left

## 2023-10-19 MED ORDER — DARBEPOETIN ALFA 150 MCG/0.3ML IJ SOSY
150.0000 ug | PREFILLED_SYRINGE | INTRAMUSCULAR | Status: DC
Start: 2023-10-22 — End: 2023-10-23
  Administered 2023-10-22: 150 ug via SUBCUTANEOUS
  Filled 2023-10-19: qty 0.3

## 2023-10-19 MED ORDER — LIDOCAINE 2% (20 MG/ML) 5 ML SYRINGE
INTRAMUSCULAR | Status: DC | PRN
  Administered 2023-10-19: 60 mg via INTRAVENOUS

## 2023-10-19 MED ORDER — HEPARIN 6000 UNIT IRRIGATION SOLUTION
Status: DC | PRN
  Administered 2023-10-19: 1

## 2023-10-19 MED ORDER — ORAL CARE MOUTH RINSE: 15.0000 mL | Freq: Once | OROMUCOSAL | Status: AC

## 2023-10-19 MED ORDER — POLYETHYLENE GLYCOL 3350 17 G PO PACK
17.0000 g | PACK | Freq: Every day | ORAL | Status: DC
  Administered 2023-10-19 – 2023-10-20 (×2): 17 g via ORAL
  Filled 2023-10-19 (×2): qty 1

## 2023-10-19 MED ORDER — MIDAZOLAM HCL 2 MG/2ML IJ SOLN
INTRAMUSCULAR | Status: AC
Start: 2023-10-19 — End: ?
  Filled 2023-10-19: qty 2

## 2023-10-19 MED ORDER — FENTANYL CITRATE (PF) 250 MCG/5ML IJ SOLN
INTRAMUSCULAR | Status: DC | PRN
  Administered 2023-10-19 (×2): 50 ug via INTRAVENOUS

## 2023-10-19 MED ORDER — CHLORHEXIDINE GLUCONATE CLOTH 2 % EX PADS
6.0000 | MEDICATED_PAD | Freq: Every day | CUTANEOUS | Status: DC
Start: 2023-10-20 — End: 2023-10-25
  Administered 2023-10-20 – 2023-10-25 (×6): 6 via TOPICAL

## 2023-10-19 MED ORDER — 0.9 % SODIUM CHLORIDE (POUR BTL) OPTIME
TOPICAL | Status: DC | PRN
  Administered 2023-10-19: 1000 mL

## 2023-10-19 MED ORDER — FENTANYL CITRATE (PF) 250 MCG/5ML IJ SOLN
INTRAMUSCULAR | Status: AC
  Filled 2023-10-19: qty 5

## 2023-10-19 MED ORDER — SUGAMMADEX SODIUM 200 MG/2ML IV SOLN
INTRAVENOUS | Status: DC | PRN
  Administered 2023-10-19: 200 mg via INTRAVENOUS

## 2023-10-19 MED ORDER — CHLORHEXIDINE GLUCONATE 0.12 % MT SOLN
OROMUCOSAL | Status: AC
  Administered 2023-10-19: 15 mL via OROMUCOSAL
  Filled 2023-10-19: qty 15

## 2023-10-19 MED ORDER — PHENYLEPHRINE HCL-NACL 20-0.9 MG/250ML-% IV SOLN
INTRAVENOUS | Status: DC | PRN
  Administered 2023-10-19: 50 ug/min via INTRAVENOUS

## 2023-10-19 MED ORDER — SODIUM CHLORIDE 0.9 % IV SOLN
100.0000 mg | INTRAVENOUS | Status: DC
Start: 2023-10-20 — End: 2023-10-22
  Administered 2023-10-20: 100 mg via INTRAVENOUS
  Filled 2023-10-19 (×3): qty 5

## 2023-10-19 MED ORDER — DEXAMETHASONE SODIUM PHOSPHATE 10 MG/ML IJ SOLN
INTRAMUSCULAR | Status: DC | PRN
  Administered 2023-10-19: 5 mg via INTRAVENOUS

## 2023-10-19 MED ORDER — SENNA 8.6 MG PO TABS
1.0000 | ORAL_TABLET | Freq: Every day | ORAL | Status: DC
  Administered 2023-10-19 – 2023-10-20 (×2): 8.6 mg via ORAL
  Filled 2023-10-19 (×2): qty 1

## 2023-10-19 MED ORDER — PROPOFOL 10 MG/ML IV BOLUS
INTRAVENOUS | Status: AC
  Filled 2023-10-19: qty 20

## 2023-10-19 MED ORDER — ASPIRIN 325 MG PO TABS
325.0000 mg | ORAL_TABLET | Freq: Every day | ORAL | Status: DC
  Administered 2023-10-20 – 2023-10-26 (×7): 325 mg via ORAL
  Filled 2023-10-19 (×7): qty 1

## 2023-10-19 MED ORDER — SODIUM CHLORIDE 0.9 % IV SOLN: INTRAVENOUS | Status: DC

## 2023-10-19 MED ORDER — HEPARIN 6000 UNIT IRRIGATION SOLUTION
Status: AC
  Filled 2023-10-19: qty 500

## 2023-10-19 MED ORDER — ONDANSETRON HCL 4 MG/2ML IJ SOLN
INTRAMUSCULAR | Status: DC | PRN
  Administered 2023-10-19: 4 mg via INTRAVENOUS

## 2023-10-19 MED ORDER — PROPOFOL 10 MG/ML IV BOLUS
INTRAVENOUS | Status: DC | PRN
  Administered 2023-10-19: 130 mg via INTRAVENOUS

## 2023-10-19 MED ORDER — ROCURONIUM BROMIDE 10 MG/ML (PF) SYRINGE
PREFILLED_SYRINGE | INTRAVENOUS | Status: DC | PRN
  Administered 2023-10-19: 60 mg via INTRAVENOUS

## 2023-10-19 MED ORDER — HEPARIN SODIUM (PORCINE) 1000 UNIT/ML IJ SOLN
INTRAMUSCULAR | Status: DC | PRN
  Administered 2023-10-19: 3000 [IU] via INTRAVENOUS

## 2023-10-19 MED ORDER — CHLORHEXIDINE GLUCONATE 0.12 % MT SOLN: 15.0000 mL | Freq: Once | OROMUCOSAL | Status: AC

## 2023-10-19 SURGICAL SUPPLY — 24 items
ARMBAND PINK RESTRICT EXTREMIT (MISCELLANEOUS) ×1 IMPLANT
BAG COUNTER SPONGE SURGICOUNT (BAG) ×1 IMPLANT
CANISTER SUCT 3000ML PPV (MISCELLANEOUS) ×1 IMPLANT
CLIP TI MEDIUM 6 (CLIP) ×1 IMPLANT
CLIP TI WIDE RED SMALL 6 (CLIP) ×1 IMPLANT
COVER PROBE W GEL 5X96 (DRAPES) IMPLANT
DERMABOND ADVANCED .7 DNX12 (GAUZE/BANDAGES/DRESSINGS) ×1 IMPLANT
ELECT REM PT RETURN 9FT ADLT (ELECTROSURGICAL) ×1 IMPLANT
ELECTRODE REM PT RTRN 9FT ADLT (ELECTROSURGICAL) ×1 IMPLANT
GLOVE BIOGEL PI IND STRL 7.0 (GLOVE) ×1 IMPLANT
GOWN STRL REUS W/ TWL LRG LVL3 (GOWN DISPOSABLE) ×2 IMPLANT
GOWN STRL REUS W/ TWL XL LVL3 (GOWN DISPOSABLE) ×1 IMPLANT
KIT BASIN OR (CUSTOM PROCEDURE TRAY) ×1 IMPLANT
KIT TURNOVER KIT B (KITS) ×1 IMPLANT
NS IRRIG 1000ML POUR BTL (IV SOLUTION) ×1 IMPLANT
PACK CV ACCESS (CUSTOM PROCEDURE TRAY) ×1 IMPLANT
PAD ARMBOARD 7.5X6 YLW CONV (MISCELLANEOUS) ×2 IMPLANT
SUT MNCRL AB 4-0 PS2 18 (SUTURE) ×1 IMPLANT
SUT PROLENE 6 0 BV (SUTURE) ×1 IMPLANT
SUT VIC AB 3-0 SH 27X BRD (SUTURE) ×1 IMPLANT
TOWEL GREEN STERILE (TOWEL DISPOSABLE) ×1 IMPLANT
UNDERPAD 30X36 HEAVY ABSORB (UNDERPADS AND DIAPERS) ×1 IMPLANT
VASCULAR TIE MINI RED 18IN STL (MISCELLANEOUS) IMPLANT
WATER STERILE IRR 1000ML POUR (IV SOLUTION) ×1 IMPLANT

## 2023-10-19 NOTE — Assessment & Plan Note (Signed)
>>  ASSESSMENT AND PLAN FOR T2DM (TYPE 2 DIABETES MELLITUS) (HCC) WRITTEN ON 10/19/2023  9:11 AM BY EVERHART, KIRSTIE, DO  A1c 6.1%.  Has insulin  pump at home where he gets around 12 units/day, but it is removed currently.  CBGs have continued to decrease, last two were in the 100s. -Very sensitive SSI -CBGs ACHS

## 2023-10-19 NOTE — Assessment & Plan Note (Addendum)
Secondary to mechanical fall.  Frequent falls at home thought to be in the setting of BKA, uremia, deconditioning. Stable s/p fracture fixation on 11/22.  Reports that his pain is controlled this morning 11/24. -Appreciate ongoing Ortho recs  -Dilaudid 0.5-1 mg Q4h PRN -Oxycodone 5-10 q4 PRN  -Oxycodone 10-15 q4 PRN -Continue calcitriol 0.93mcg BID -PT/OT eval and treat -Fall precautions

## 2023-10-19 NOTE — Progress Notes (Signed)
CCC Pre-op Review  Pre-op checklist:  incomplete / floor RN reminded to complete  NPO: since MN  Labs:  11/25 8.3 / 26.1   plts 149  k 5.1  glu 138                   Bun / Cr 76 / 9.56    Consent:   signed   H&P:   surgeon 11/22  Vitals:   @0720   98.7 p 81  r 17  180/91 (113)  95% RA  O2 requirements:   RA  MAR/PTA review:   Zinacef OCTOR       COREG 12.5mg  due @ 0800       IV:   20g LFA       DL tunneled dialysis catheter  Floor nurse name:    Norman Clay RN  Additional info:   DM 2  /  ESRD not on hemo at present per MD note /   R BKA

## 2023-10-19 NOTE — Assessment & Plan Note (Addendum)
A1c 6.1%.  Has insulin pump at home where he gets around 12 units/day, but it is removed currently.  CBGs have continued to decrease, last two were in the 100s. -Very sensitive SSI -CBGs ACHS

## 2023-10-19 NOTE — TOC Initial Note (Signed)
Transition of Care [facility]) - Initial/Assessment Note    Patient Details  Name: [name] [name] MRN: [id number] Date of Birth: August 06, 1971  Transition of [facility]) CM/SW Contact:    Glennon Mac, RN Phone Number: 10/19/2023, 3:00pm  Clinical Narrative:                 Pt is a 52 y/o M admitted on 10/13/23 after presenting with R intertrochanteric fracture. Pt is s/p fixation on 10/15/23. PMH: frequent falls, CKD IV, MDD, R BKA, DM2, PAD  PTA, pt independent and ambulatory with cane; he lives with his mother and sister. PT/OT recommending SNF for short term rehab, and patient is agreeable to bed search.  Will provide bed offers when available.   Expected Discharge Plan: Skilled Nursing Facility Barriers to Discharge: Continued Medical Work up   Patient Goals and CMS Choice   CMS Medicare.gov Compare Post Acute Care list provided to:: Patient Choice offered to / list presented to : Patient      Expected Discharge Plan and Services   Discharge Planning Services: CM Consult Post Acute Care Choice: Skilled Nursing Facility Living arrangements for the past 2 months: Single Family Home                                      Prior Living Arrangements/Services Living arrangements for the past 2 months: Single Family Home Lives with:: Parents, Siblings Patient language and need for interpreter reviewed:: Yes Do you feel safe going back to the place where you live?: Yes      Need for Family Participation in Patient Care: Yes (Comment) Care giver support system in place?: No (comment)   Criminal Activity/Legal Involvement Pertinent to Current Situation/Hospitalization: No - Comment as needed  Activities of Daily Living   ADL Screening (condition at time of admission) Independently performs ADLs?: Yes (appropriate for developmental age) Is the patient deaf or have difficulty hearing?: No Does the patient have difficulty seeing, even when wearing glasses/contacts?:  No Does the patient have difficulty concentrating, remembering, or making decisions?: No                   Emotional Assessment Appearance:: Appears stated age Attitude/Demeanor/Rapport: Guarded Affect (typically observed): Flat Orientation: : Oriented to Self, Oriented to Place, Oriented to  Time, Oriented to Situation      Admission diagnosis:  Closed intertrochanteric fracture of right hip (HCC) [S72.141A] Fall, initial encounter [W19.XXXA] Closed fracture of right hip, initial encounter (HCC) [S72.001A] Anemia, unspecified type [D64.9] Patient Active Problem List   Diagnosis Date Noted   Closed fracture of right hip (HCC) 10/15/2023   Controlled diabetes mellitus type 2 with complications (HCC) 10/14/2023   Urinary retention 10/14/2023   Closed intertrochanteric fracture of right hip (HCC) 10/13/2023   Patellar tendinitis 10/12/2023   Wrist sprain, left, initial encounter 10/12/2023   Contracture, 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   Mucosal abnormality of esophagus 02/18/2023   Foot pain, left 02/17/2023   SOB (shortness of breath) 02/16/2023   History of esophagitis 02/16/2023   Tachycardia 02/15/2023   Allergic rhinitis 11/07/2022   Chronic kidney disease, stage 5 (HCC) 09/11/2022   Tobacco dependence 09/11/2022   GERD with esophagitis 09/02/2021   Acute kidney injury superimposed on CKD (HCC) 04/10/2021   Achilles tendon contracture, left 03/03/2018   Non-pressure chronic ulcer of other part of left foot limited to breakdown of skin (HCC) 02/02/2018  History of right below knee amputation (HCC) 12/22/2017   Acute posthemorrhagic anemia    Falls 11/24/2017   Essential hypertension 11/24/2017   Anemia 11/24/2017   Uncontrolled type 2 diabetes mellitus with hyperglycemia, with long-term current use of insulin (HCC) 10/11/2017   ARF (acute renal failure) (HCC) 10/11/2017   PCP:  [name], MD Pharmacy:   [facility] [phone] - [place], [place] - [address] [place] [address] Phone: [phone] Fax: [phone]  [facility] [address] [place] [address] Phone: [phone] Fax: [phone]     Social Determinants of Health (SDOH) Social History: SDOH Screenings   Food Insecurity: No Food Insecurity (10/14/2023)  Housing: Medium Risk (10/14/2023)  Transportation Needs: No Transportation Needs (10/14/2023)  Utilities: Not At Risk (10/14/2023)  Alcohol Screen: Low Risk  (06/08/2023)  Depression (PHQ2-9): Medium Risk (10/12/2023)  Financial Resource Strain: High Risk (08/26/2023)  Physical Activity: Inactive (06/08/2023)  Social Connections: Socially Isolated (06/08/2023)  Stress: Stress Concern Present (08/26/2023)  Tobacco Use: High Risk (10/19/2023)  Health Literacy: Inadequate Health Literacy (06/08/2023)   SDOH Interventions:     Readmission Risk Interventions     No data to display         Quintella Baton, RN, BSN  Trauma/Neuro ICU Case Manager [phone]

## 2023-10-19 NOTE — Anesthesia Procedure Notes (Signed)
Procedure Name: Intubation Date/Time: 10/19/2023 9:35 AM  Performed by: Loleta Jlynn Langille, CRNAPre-anesthesia Checklist: Patient identified, Patient being monitored, Timeout performed, Emergency Drugs available and Suction available Patient Re-evaluated:Patient Re-evaluated prior to induction Oxygen Delivery Method: Circle system utilized Preoxygenation: Pre-oxygenation with 100% oxygen Induction Type: IV induction Ventilation: Mask ventilation without difficulty Laryngoscope Size: Mac and 4 Grade View: Grade I Tube type: Oral Tube size: 7.5 mm Number of attempts: 1 Airway Equipment and Method: Stylet Placement Confirmation: ETT inserted through vocal cords under direct vision, positive ETCO2 and breath sounds checked- equal and bilateral Secured at: 23 cm Tube secured with: Tape Dental Injury: Teeth and Oropharynx as per pre-operative assessment

## 2023-10-19 NOTE — Assessment & Plan Note (Addendum)
Creatinine at known peak, good urine output yesterday.  Received AV fistula today. - Appreciate ongoing Nephro recs -Holding Farxiga for now -Avoid nephrotoxic agents -Strict I&Os -Continue calcitriol and Renvela -RFP daily

## 2023-10-19 NOTE — Progress Notes (Signed)
[facility] NEPHROLOGY PROGRESS NOTE  Assessment/ Plan: Pt is a 52 y.o. yo male with a history of DM 2, HTN, urinary retention, PAD (status post right BKA), HLD CKD5, presented after a fall and hip fracture.  # Fall/right intertrochanteric femur fracture status post IM nail/operative repair on 11/22.  Further management per orthopedic surgery.  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  Agreed to start dialysis.  Status post right IJ TDC by IR on 11/22.  First HD on 11/23.  He had left arm brachiobasilic AVF creation on 11/26 with Dr. Hetty Blend - Plan for next HD on 11/27 (Wed) and then anticipate transition to TTS schedule.   - I have reached out to HD SW to initiate CLIP process  # Anemia of CKD and iron deficiency.  Iron saturation of 8%.   - Treating with IV iron x 5 doses - on aranesp 100 mcg every Friday.  He was previously on ESA outpatient.  Increase aranesp to 150 mcg every Friday while inpatient for now    # HTN/volume: optimize volume with HD.   - Note that with regard to discharge meds, we have stopped amlodipine as he is on concurrent diltiazem and should not be on both    # CKD-MBD/hyperphosphatemia: Increased sevelamer, intact PTH is pending.    Disposition - from a strictly renal standpoint continue inpatient monitoring; we are starting HD and awaiting an outpatient HD unit   Subjective:  He had 3.2 L UOP over 11/25.  Last HD on 11/25 with 1.5 kg UF.  He had left arm brachiobasilic AVF creation on 11/26 with Dr. Hetty Blend.  His mother and his sister are at bedside.  He thought there would be a tube in his arm so he was confused when he got back from surgery and couldn't see a tube.  We discussed AVF specifics and RN is going to reinforce.   Review of systems:    He has had nausea - also happens outpatient Denies shortness of breath or chest pain  Still making quite a bit of urine     Objective Vital signs in last 24 hours: Vitals:    10/19/23 1115 10/19/23 1130 10/19/23 1137 10/19/23 1153  BP: (!) 148/70 (!) 150/72  (!) 159/76  Pulse: 74 73 71 71  Resp: 14 12 14 16   Temp:   97.6 F (36.4 C) 97.8 F (36.6 C)  TempSrc:    Oral  SpO2: 93% 95% 96% 95%  Weight:      Height:       Weight change: -4.9 kg  Intake/Output Summary (Last 24 hours) at 10/19/2023 1237 Last data filed at 10/19/2023 1113 Gross per 24 hour  Intake 303 ml  Output 4820 ml  Net -4517 ml      Labs: RENAL PANEL Recent Labs  Lab 10/14/23 0325 10/14/23 1616 10/15/23 0733 10/16/23 0527 10/17/23 0621 10/18/23 0645 10/19/23 0602  NA  --   --  136 135 135 134* 137  K  --   --  5.1 5.5* 5.1 4.7 4.3  CL  --   --  110 106 103 101 101  CO2  --   --  16* 16* 19* 23 23  GLUCOSE  --   --  140* 261* 138* 116* 108*  BUN  --   --  81* 90* 76* 59* 46*  CREATININE  --   --  10.40* 11.23* 9.56* 7.90* 6.26*  CALCIUM  --   --  7.6* 7.9* 7.9* 7.8* 8.0*  PHOS  --  5.9* 6.6*  --  7.9* 6.8* 6.1*  ALBUMIN 2.3*  --  2.2*  --  2.2* 2.2* 2.2*    Liver Function Tests: Recent Labs  Lab 10/14/23 0325 10/15/23 0733 10/17/23 0621 10/18/23 0645 10/19/23 0602  AST 8*  --   --   --   --   ALT 10  --   --   --   --   ALKPHOS 50  --   --   --   --   BILITOT 0.6  --   --   --   --   PROT 5.2*  --   --   --   --   ALBUMIN 2.3*   < > 2.2* 2.2* 2.2*   < > = values in this interval not displayed.   Recent Labs  Lab 10/13/23 2008  LIPASE 50   No results for input(s): "AMMONIA" in the last 168 hours. CBC: Recent Labs    07/01/23 1223 07/01/23 1228 07/29/23 1155 07/29/23 1203 08/26/23 1253 08/26/23 1300 10/07/23 1217 10/07/23 1219 10/14/23 0325 10/14/23 1348 10/16/23 1410 10/16/23 1936 10/17/23 0621 10/18/23 0645 10/19/23 0602  HGB  --    < >  --    < >  --    < >  --    < > 6.3*   < > 6.8* 7.4* 8.2* 8.3* 8.2*  MCV  --   --   --   --   --   --   --    < > 92.6   < > 90.9  --  90.7 90.3 89.7  FERRITIN 14*  --  17*  --  24  --  50  --  39  --    --   --   --   --   --   TIBC 349  --  354  --  305  --  260  --  225*  --   --   --   --   --   --   IRON 76  --  91  --  126  --  79  --  19*  --   --   --   --   --   --    < > = values in this interval not displayed.    Cardiac Enzymes: No results for input(s): "CKTOTAL", "CKMB", "CKMBINDEX", "TROPONINI" in the last 168 hours. CBG: Recent Labs  Lab 10/18/23 2215 10/18/23 2337 10/19/23 0340 10/19/23 0716 10/19/23 1110  GLUCAP 180* 164* 112* 136* 112*    Iron Studies:  No results for input(s): "IRON", "TIBC", "TRANSFERRIN", "FERRITIN" in the last 72 hours.  Studies/Results: No results found.  Medications: Infusions:  iron sucrose Stopped (10/17/23 1620)    Scheduled Medications:  acetaminophen  650 mg Oral Q6H   [START ON 10/20/2023] aspirin  325 mg Oral Daily   atorvastatin  40 mg Oral QHS   baclofen  10 mg Oral Daily   calcitRIOL  0.5 mcg Oral Daily   carvedilol  12.5 mg Oral BID WC   Chlorhexidine Gluconate Cloth  6 each Topical Q0600   darbepoetin (ARANESP) injection - DIALYSIS  100 mcg Subcutaneous Q Fri-1800   diltiazem  300 mg Oral Daily   FLUoxetine  10 mg Oral Daily   gabapentin  300 mg Oral Daily   hydrALAZINE  100 mg Oral BID   influenza vac split trivalent PF  0.5 mL Intramuscular Tomorrow-1000   insulin aspart  0-6 Units Subcutaneous TID WC   mupirocin ointment  1 Application Nasal BID   polyethylene glycol  17 g Oral Daily   senna  1 tablet Oral Daily   sevelamer carbonate  1,600 mg Oral TID WC   sodium chloride flush  3 mL Intravenous Q12H   Vitamin D (Ergocalciferol)  50,000 Units Oral Q7 days    have reviewed scheduled and prn medications.  Physical Exam:    General adult male in bed in no acute distress HEENT normocephalic atraumatic extraocular movements intact sclera anicteric Neck supple trachea midline Lungs clear to auscultation bilaterally normal work of breathing at rest on room air Heart S1S2 no rub Abdomen soft nontender  nondistended Extremities right BKA; no edema  Psych blunted affect - usual for him Neuro alert and oriented x 3 provides hx and follows commands Access RIJ tunn catheter; LUE AVF with bruit and thril    Estanislado Emms 10/19/2023,1:02 PM  LOS: 6 days

## 2023-10-19 NOTE — Progress Notes (Signed)
PT Cancellation Note  Patient Details Name: [name] [name] MRN: [id number] DOB: [birth year]   Cancelled Treatment:    Reason Eval/Treat Not Completed: Patient at procedure or test/unavailable (Pt off the floor for procedure. Will follow up if time allows.)   Gladys Damme 10/19/2023, 8:27 AM

## 2023-10-19 NOTE — Assessment & Plan Note (Addendum)
Stable, Hemoglobin 8.2 this morning.  Acute on chronic anemia during this admission suspected to be in the setting of anemia of chronic disease vs acute blood loss in the setting of fracture vs EPO deficiency in setting of progressed CKD.  -Transfusion threshold <7 -Iron infusion on HD days x 5 doses -Appreciate ongoing Nephro recs -AM CBC

## 2023-10-19 NOTE — Transfer of Care (Signed)
Immediate Anesthesia Transfer of Care Note  Patient: [name] [name]  Procedure(s) Performed: LEFT BRACHIOCEPHALIC ARTERIOVENOUS (AV) FISTULA CREATION (Left: Arm Upper)  Patient Location: PACU  Anesthesia Type:General  Level of Consciousness: awake  Airway & Oxygen Therapy: Patient Spontanous Breathing and Patient connected to face mask oxygen  Post-op Assessment: Report given to RN and Post -op Vital signs reviewed and stable  Post vital signs: Reviewed and stable  Last Vitals:  Vitals Value Taken Time  BP 144/72 10/19/23 1108  Temp 36.4 C 10/19/23 1108  Pulse 86 10/19/23 1112  Resp 0 10/19/23 1112  SpO2 94 % 10/19/23 1112  Vitals shown include unfiled device data.  Last Pain:  Vitals:   10/19/23 0837  TempSrc:   PainSc: 8       Patients Stated Pain Goal: 0 (10/17/23 1349)  Complications: No notable events documented.

## 2023-10-19 NOTE — Op Note (Signed)
    NAME: [name] [name]    MRN: [id number] DOB: 1971/09/15    DATE OF OPERATION: 10/19/2023  PREOP DIAGNOSIS:    ESRD on HD via RIJ TDC  POSTOP DIAGNOSIS:    Same  PROCEDURE:    Left arm brachiocephalic AVF creation  SURGEON: Daria Pastures  ASSIST: Kayren Eaves  ANESTHESIA: general   EBL: minimal  INDICATIONS:    [name] [name] is a right handed 52 y.o. male with ESRD on HD via RIJ TDC. Risks and benefits of permanent AV access creation were reviewed and he elected to proceed.   FINDINGS:   Large cephalic vein in the upper arm. Palpable and doppler thrill on conclusion Palpable radial on conclusion  TECHNIQUE:   The patient was brought to the operating room and placed in supine position. The left arm was prepped and draped in a standard fashion.  Preoperative antibiotics were administered and a timeout was performed.    The case began with ultrasound mapping of the brachial artery and cephalic vein, which demonstrated sufficient size at the antecubital fossa for arteriovenous fistula.  A transverse incision was made 1 finger breadth above the elbow creese in the antecubital fossa. The  cephalic vein was isolated for 3 cm in length. Next the aponeurosis was partially released and the brachial artery secured with a vessel loop. The patient was heparinized. The cephalic vein was transected and ligated distally with a silk tie vascular clip. The vein was dilated and flushed with heparin saline. Vascular clamps were placed proximally and distally on the brachial artery and an approximate 6 mm arteriotomy was created on the brachial artery. This was flushed with heparin saline.  An anastomosis was created in end to side fashion on the brachial artery using running 6-0 Prolene suture. Prior to completing the anastomosis, the vessels were flushed and the suture line was tied down. There was an excellent thrill in the cephalic vein from the anastomosis to the mid upper arm.  The patient had a multiphasic radial and ulnar signal. He had an excellent doppler signal in the fistula. The incision was irrigated and hemostasis achieved with cautery and suture. The deeper tissue was closed with 3-0 Vicryl and the skin closed with 4-0 Monocryl. Dermabond was applied to the incisions. The patient was transferred to PACU in stable condition.   Given the complexity of the case,  the assistant was necessary in order to expedient the procedure and safely perform the technical aspects of the operation.  The assistant provided traction and countertraction to assist with exposure of the artery and vein.  They also assisted with suture ligation of multiple venous branches.  They played a critical role in the anastomosis. These skills, especially following the Prolene suture for the anastomosis, could not have been adequately performed by a scrub tech assistant.   Daria Pastures, MD [facility] DATE OF DICTATION:   10/19/2023

## 2023-10-19 NOTE — Progress Notes (Addendum)
      Right hand dominant  TDC placed by IR 10/15/23 ESRD Plan for OR 10/19/23 for left UE AV fistula verses graft. NPO past MN order placed on chart as well as consent Patient agrees to proceed  Mosetta Pigeon PA-C  VASCULAR STAFF ADDENDUM: I have independently interviewed and examined the patient. I agree with the above.  Risks and benefits reviewed. Plan for Left AVF creation  Daria Pastures MD [facility] Phone Number: [phone] 10/19/2023 8:40 AM

## 2023-10-19 NOTE — Progress Notes (Signed)
OT Cancellation Note  Patient Details Name: [name] MRN: [id number] DOB: 31-Mar-1971   Cancelled Treatment:    Reason Eval/Treat Not Completed: Patient at procedure or test/ unavailable Pt off unit for left UE AV fistula verses graft and unavailable this AM for OT treatment. Will follow up with pt at a later time for OT treatment.  Limmie Patricia, OTR/L,CBIS  Supplemental OT - [facility] Secure Chat Preferred   10/19/2023, 8:25 AM

## 2023-10-19 NOTE — Anesthesia Preprocedure Evaluation (Signed)
Anesthesia Evaluation  Patient identified by MRN, date of birth, ID band Patient awake    Reviewed: Allergy & Precautions, NPO status , Patient's Chart, lab work & pertinent test results  History of Anesthesia Complications Negative for: history of anesthetic complications  Airway Mallampati: III  TM Distance: >3 FB Neck ROM: Full    Dental  (+) Teeth Intact, Dental Advisory Given   Pulmonary neg shortness of breath, neg COPD, neg recent URI, Current Smoker and Patient abstained from smoking.   breath sounds clear to auscultation       Cardiovascular hypertension, Pt. on medications (-) angina + Peripheral Vascular Disease  (-) Past MI  Rhythm:Regular  1. Left ventricular ejection fraction, by estimation, is 60 to 65%. The  left ventricle has normal function. The left ventricle has no regional  wall motion abnormalities. There is mild left ventricular hypertrophy.  Left ventricular diastolic parameters  were normal.   2. Right ventricular systolic function is normal. The right ventricular  size is normal. Tricuspid regurgitation signal is inadequate for assessing  PA pressure.   3. The mitral valve is normal in structure. No evidence of mitral valve  regurgitation. No evidence of mitral stenosis.   4. The aortic valve has an indeterminant number of cusps. Aortic valve  regurgitation is not visualized. No aortic stenosis is present.   5. The inferior vena cava is normal in size with greater than 50%  respiratory variability, suggesting right atrial pressure of 3 mmHg.     Neuro/Psych  PSYCHIATRIC DISORDERS  Depression     Neuromuscular disease    GI/Hepatic Neg liver ROS,GERD  ,,  Endo/Other  diabetes, Insulin Dependent    Renal/GU ESRF and DialysisRenal diseaseLab Results      Component                Value               Date                      NA                       137                 10/19/2023                K                         4.3                 10/19/2023                CO2                      23                  10/19/2023                GLUCOSE                  108 (H)             10/19/2023                BUN                      46 (H)  10/19/2023                CREATININE               6.26 (H)            10/19/2023                CALCIUM                  8.0 (L)             10/19/2023                EGFR                     12 (L)              05/18/2023                GFRNONAA                 10 (L)              10/19/2023            Hd yesterday     Musculoskeletal  (+) Arthritis ,    Abdominal   Peds  Hematology  (+) Blood dyscrasia, anemia Lab Results      Component                Value               Date                      WBC                      9.1                 10/19/2023                HGB                      8.2 (L)             10/19/2023                HCT                      26.2 (L)            10/19/2023                MCV                      89.7                10/19/2023                PLT                      150                 10/19/2023              Anesthesia Other Findings   Reproductive/Obstetrics                              Anesthesia Physical Anesthesia Plan  ASA: 4  Anesthesia Plan: General  Post-op Pain Management: Minimal or no pain anticipated   Induction: Intravenous  PONV Risk Score and Plan: 1 and Ondansetron and Dexamethasone  Airway Management Planned: Oral ETT  Additional Equipment: None  Intra-op Plan:   Post-operative Plan: Extubation in OR  Informed Consent: I have reviewed the patients History and Physical, chart, labs and discussed the procedure including the risks, benefits and alternatives for the proposed anesthesia with the patient or authorized representative who has indicated his/her understanding and acceptance.     Dental advisory given  Plan Discussed with:  CRNA  Anesthesia Plan Comments:          Anesthesia Quick Evaluation

## 2023-10-19 NOTE — Progress Notes (Signed)
Daily Progress Note Intern Pager: [phone]  Patient name: [name] [name] Medical record number: [id number] Date of birth: [birth year] Age: 52 y.o. Gender: male  Primary Care Provider: [name], MD Consultants: Orthopedic Surgery, Nephrology, Interventional Radiology  Code Status: Full   Pt Overview and Major Events to Date:  11/20 - Admitted to FMTS 11/22 - Surgical fixation of fracture, TDC placement per IR 11/23 - First HD session 11/26 - AV fistula placement  Assessment and Plan:  52 y.o. male with a pertinent PMH of frequent falls, CKD stage IV, MDD, R BKA, T2DM, and PAD who presented with R intertrochanteric proximal femur fracture and was admitted for fixation of fracture and falls workup. Now s/p fracture fixation on 11/22 and [facility] placement on 11/22.  Patient had first HD 11/23. Following nephrology/ortho recommendations and will need SNF placement Assessment & Plan Closed intertrochanteric fracture of right hip (HCC) Secondary to mechanical fall.  Frequent falls at home thought to be in the setting of BKA, uremia, deconditioning. Stable s/p fracture fixation on 11/22.  Reports that his pain is controlled this morning 11/24. -Appreciate ongoing Ortho recs  -Dilaudid 0.5-1 mg Q4h PRN -Oxycodone 5-10 q4 PRN  -Oxycodone 10-15 q4 PRN -Continue calcitriol 0.4mcg BID -PT/OT eval and treat -Fall precautions Acute kidney injury superimposed on CKD (HCC) Creatinine at known peak, good urine output yesterday.  Received AV fistula today. - Appreciate ongoing Nephro recs -Holding Farxiga for now -Avoid nephrotoxic agents -Strict I&Os -Continue calcitriol and Renvela -RFP daily Anemia Stable, Hemoglobin 8.2 this morning.  Acute on chronic anemia during this admission suspected to be in the setting of anemia of chronic disease vs acute blood loss in the setting of fracture vs EPO deficiency in setting of progressed CKD.  -Transfusion threshold <7 -Iron infusion on  HD days x 5 doses -Appreciate ongoing Nephro recs -AM CBC Controlled diabetes mellitus type 2 with complications (HCC) A1c 6.1%.  Has insulin pump at home where he gets around 12 units/day, but it is removed currently.  CBGs have continued to decrease, last two were in the 100s. -Very sensitive SSI -CBGs ACHS Urinary retention Foley catheter now placed.  2.8L OP in last 24h.  -Strict I&Os -Foley care per nursing   Chronic and Stable Problems:  Hypertension: Continue home carvedilol 12.5 mg BID, amlodipine 5 mg daily, diltiazem 300 mg daily, hydralazine 100 mg BID HFpEF: Holding Lasix Nicotine dependence: Half pack a day, declines nicotine patch PAD: S/p right BKA, encourage nicotine cessation Neuropathy: Continue gabapentin 300mg  daily Hyperlipidemia: Continue atorvastatin 400mg  daily MDD: Continue fluoxetine 10 mg daily    FEN/GI: NPO currently PPx: aspirin Dispo:SNF pending clinical improvement   Subjective:  No acute events overnight. Complains of R hip pain, unchanged but says pain medications do not help. No nausea this morning. No BM since prior to admission.   Objective: Temp:  [97.7 F (36.5 C)-98.8 F (37.1 C)] 98.4 F (36.9 C) (11/26 0830) Pulse Rate:  [81-101] 95 (11/26 0830) Resp:  [10-18] 18 (11/26 0830) BP: (174-203)/(87-97) 174/90 (11/26 0830) SpO2:  [90 %-97 %] 90 % (11/26 0830) Weight:  [107 kg-109.5 kg] 107 kg (11/26 0830) Physical Exam: General: no acute distress, resting comfortably Cardiovascular: RRR, no murmurs Respiratory: no increased WOB on RA Abdomen: Soft, non-tender, non-distended Extremities: No erythema, warmth, hematoma to R stump or R hip. TDC catheter site C/D/I  Laboratory: Most recent CBC Lab Results  Component Value Date   WBC 9.1 10/19/2023   HGB 8.2 (  L) 10/19/2023   HCT 26.2 (L) 10/19/2023   MCV 89.7 10/19/2023   PLT 150 10/19/2023   Most recent BMP    Latest Ref Rng & Units 10/19/2023    6:02 AM  BMP  Glucose 70 -  99 mg/dL 742   BUN 6 - 20 mg/dL 46   Creatinine 5.95 - 1.24 mg/dL 6.38   Sodium 756 - 433 mmol/L 137   Potassium 3.5 - 5.1 mmol/L 4.3   Chloride 98 - 111 mmol/L 101   CO2 22 - 32 mmol/L 23   Calcium 8.9 - 10.3 mg/dL 8.0     PTH pending   Wynell Halberg, DO 10/19/2023, 9:11 AM  PGY-1, [facility] FPTS Intern pager: [phone], text pages welcome Secure chat group [facility] Teaching Service

## 2023-10-19 NOTE — Progress Notes (Signed)
New Dialysis Start    Patient identified as new dialysis start. Kidney Education packet assembled and given. Discussed the following items with patient:     Current medications and possible changes once started:  Discussed that patient's medications may change over time.  Ex; hypertension medications and diabetes medication.  Nephrologists will adjust as needed.   Fluid restrictions reviewed:  32 oz daily goal:  All liquids count; soups, ice, jello, fruits. Will also refer dietitian.   Phosphorus and potassium: Handout given showing high potassium and phosphorus foods.  Alternative food and drink options given. Will also refer dietitian.   Family support:  None present at bedside   Outpatient Clinic Resources:  Discussed roles of Outpatient clinic staff and advised to make a list of needs, if any, to talk with outpatient staff if needed.   Care plan schedule: Informed patient of Care Plans in outpatient setting and to participate in the care plan.  An invitation would be given from outpatient clinic.    Dialysis Access Options:  Reviewed access options with patients. Discussed in detail about care at home with new AVG & AVF. Reviewed checking bruit and thrill. If dialysis catheter present, educated that patient could not take showers.  Catheter dressing changes were to be done by outpatient clinic staff only.   Home therapy options:  Educated patient about home therapy options:  PD vs home hemo.     Patient verbalized understanding of information covered. Patient refused the education packet that was provided after explaining what information it entailed about diet and coping with CKD. Expressed concern that this is a new medical diagnosis, with a lot of information being presented to him at the same time, will leave packet in room in case patient changes his mind or if family members would like the information.  Will continue to round on patient during admission.    [facility]  Nurse Coordinator [phone]

## 2023-10-19 NOTE — Progress Notes (Signed)
Occupational Therapy Treatment Patient Details Name: [name] [name] MRN: [id number] DOB: 1970-12-19 Today's Date: 10/19/2023   History of present illness Pt is a 52 y/o male admitted on 10/13/23 after presenting with R intertrochanteric fracture. Pt is s/p fixation on 10/15/23.  PMH: frequent falls, CKD IV, MDD, R BKA, DM2, PAD   OT comments  Pt in bed upon therapy arrival. Pt participated in OT treatment session focusing on bed mobility, activity tolerance, endurance, and functional transfers. Pt attempted to don R prothesis although due to leg swelling from recent surgery, pt was unable to don. Pt was able to stand and complete heal toe pivot towards left side and sit in recliner. 2 person assist was provided for safety. Pt educated during session on use of ice for pain management and to decrease swelling in RLE. D/C plan continues to be appropriate. Patient will benefit from continued inpatient follow up therapy, <3 hours/day.       If plan is discharge home, recommend the following:  Two people to help with walking and/or transfers;Two people to help with bathing/dressing/bathroom;Assistance with cooking/housework;Assist for transportation;Help with stairs or ramp for entrance   Equipment Recommendations  Other (comment) (defer to next venue of care)       Precautions / Restrictions Precautions Precautions: Fall Precaution Comments: RLE prosthesis (in room), chronic R BKA Restrictions Weight Bearing Restrictions: Yes RLE Weight Bearing: Weight bearing as tolerated       Mobility Bed Mobility Overal bed mobility: Needs Assistance Bed Mobility: Supine to Sit     Supine to sit: Supervision     General bed mobility comments: Attempted bed mobility from a flat surface to prepare for bed at home although pt declined and requested HOB to be elevated. HOB elevated to 14 degrees. Pt educated on rolling to his left side while grabbing onto bed rail to assist with transition. Pt  said nothing and demonstrated preference of crunching up to sit with max effort noted and increased time to achieve.    Transfers Overall transfer level: Needs assistance Equipment used: Rolling walker (2 wheels) Transfers: Sit to/from Stand, Bed to chair/wheelchair/BSC Sit to Stand: Min assist, +2 physical assistance, +2 safety/equipment, From elevated surface Stand pivot transfers: Min assist, +2 physical assistance, +2 safety/equipment         General transfer comment: As R prothesis was unable to be donned due to limb swelling, Pt completed stand/heel toe pivot with 2 person assist for safety. VC for hand placement with RW management prior to sit to stand. VC provided for RW management while transitioning as pt attempted to push RW too far forward.     Balance Overall balance assessment: History of Falls, Needs assistance Sitting-balance support: Feet supported, No upper extremity supported Sitting balance-Leahy Scale: Fair Sitting balance - Comments: supervision static sitting EOB   Standing balance support: Bilateral upper extremity supported, During functional activity, Reliant on assistive device for balance Standing balance-Leahy Scale: Zero Standing balance comment: very reliant on RW due to R prothesis being off.       ADL either performed or assessed with clinical judgement      Cognition Arousal: Alert Behavior During Therapy: Flat affect Overall Cognitive Status: No family/caregiver present to determine baseline cognitive functioning      General Comments: Very limited conversation during session.  Followed all direction and commands appropriately.              General Comments O2 White removed and pt remained at 91% RA. Nursing notified.  Pertinent Vitals/ Pain       Pain Assessment Pain Assessment: 0-10 Pain Score: 9  Pain Location: Right hip Pain Descriptors / Indicators: Discomfort Pain Intervention(s): Limited activity within patient's tolerance,  Monitored during session, RN gave pain meds during session, Ice applied, Repositioned         Frequency  Min 1X/week        Progress Toward Goals  OT Goals(current goals can now be found in the care plan section)  Progress towards OT goals: Progressing toward goals            AM-PAC OT "6 Clicks" Daily Activity     Outcome Measure   Help from another person eating meals?: None Help from another person taking care of personal grooming?: A Little Help from another person toileting, which includes using toliet, bedpan, or urinal?: Total (foley) Help from another person bathing (including washing, rinsing, drying)?: A Lot Help from another person to put on and taking off regular upper body clothing?: A Little Help from another person to put on and taking off regular lower body clothing?: A Lot 6 Click Score: 15    End of Session Equipment Utilized During Treatment: Gait belt;Rolling walker (2 wheels)  OT Visit Diagnosis: Unsteadiness on feet (R26.81);Other abnormalities of gait and mobility (R26.89);Repeated falls (R29.6);Muscle weakness (generalized) (M62.81);History of falling (Z91.81);Pain Pain - Right/Left: Right Pain - part of body: Hip   Activity Tolerance Patient tolerated treatment well   Patient Left in chair;with call bell/phone within reach;with chair alarm set   Nurse Communication Mobility status;Patient requests pain meds        Time: 9604-5409 OT Time Calculation (min): 55 min  Charges: OT General Charges $OT Visit: 1 Visit OT Treatments $Self Care/Home Management : 23-37 mins $Therapeutic Activity: 23-37 mins  Limmie Patricia, OTR/L,CBIS  Supplemental OT - [facility] Secure Chat Preferred    Korion Cuevas, Charisse March 10/19/2023, 6:09 PM

## 2023-10-19 NOTE — Assessment & Plan Note (Addendum)
Foley catheter now placed.  2.8L OP in last 24h.  -Strict I&Os -Foley care per nursing

## 2023-10-19 NOTE — Progress Notes (Addendum)
Nutrition Education Note  RD consulted for Renal Education. Provided "Nutrition for Dialysis" Reviewed food groups and provided written recommended serving sizes specifically for protein portions and other food groups determined for patient's current nutritional status.   Note-patient appears quite overwhelmed with a flat affect during visit. Provided basics of renal meal plan.  Plan to follow up while in house to review information, provide expanded detail, answer questions.  Explained why diet restrictions are needed and provided lists of foods to minimize that are high potassium, sodium, and phosphorus, fluid restriction and what to count towards fluid allowance.  Discussed importance of protein intake at each meal and snack. Provided examples of how to maximize protein throughout the day.  Discussed need for fluid restriction with dialysis.  Encouraged pt to discuss specific diet questions/concerns with RD at HD outpatient facility.   Expect possible compliance.  Body mass index is 27.99 kg/m.   Current diet order is Renal with 1200 mL fluid restriction, patient is consuming approximately 75-80% of meals at this time. Labs and medications reviewed.   Alvino Chapel, RDLD Clinical Dietitian See AMION for contact information

## 2023-10-19 NOTE — Plan of Care (Signed)
  Problem: Education: Goal: Knowledge of General Education information will improve Description Including pain rating scale, medication(s)/side effects and non-pharmacologic comfort measures Outcome: Progressing   

## 2023-10-19 NOTE — Plan of Care (Signed)
Problem: Education: Goal: Ability to describe self-care measures that may prevent or decrease complications (Diabetes Survival Skills Education) will improve Outcome: Progressing Goal: Individualized Educational Video(s) Outcome: Progressing   Problem: Coping: Goal: Ability to adjust to condition or change in health will improve Outcome: Progressing   Problem: Fluid Volume: Goal: Ability to maintain a balanced intake and output will improve Outcome: Progressing   Problem: Skin Integrity: Goal: Risk for impaired skin integrity will decrease Outcome: Progressing

## 2023-10-19 NOTE — [place] FL2 (Signed)
MEDICAID FL2 LEVEL OF CARE FORM     IDENTIFICATION  Patient Name: [name] [name] Birthdate: [birth year] Sex: male Admission Date (Current Location): 10/13/2023  [facility] and [place] Number:  Producer, television/film/video and Address:  [facility] [place], [address], [place], [place] [address]      Provider Number: [id number]  Attending Physician Name and Address:  Latrelle Dodrill, MD  Relative Name and Phone Number:  Jhaylen Tischer, mother: [phone]    Current Level of Care: Hospital Recommended Level of Care: Skilled Nursing Facility Prior Approval Number:    Date Approved/Denied:   PASRR Number: [id number]  Discharge Plan: SNF    Current Diagnoses: Patient Active Problem List   Diagnosis Date Noted   Closed fracture of right hip (HCC) 10/15/2023   Controlled diabetes mellitus type 2 with complications (HCC) 10/14/2023   Urinary retention 10/14/2023   Closed intertrochanteric fracture of right hip (HCC) 10/13/2023   Patellar tendinitis 10/12/2023   Wrist sprain, left, initial encounter 10/12/2023   Contracture, 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   Mucosal abnormality of esophagus 02/18/2023   Foot pain, left 02/17/2023   SOB (shortness of breath) 02/16/2023   History of esophagitis 02/16/2023   Tachycardia 02/15/2023   Allergic rhinitis 11/07/2022   Chronic kidney disease, stage 5 (HCC) 09/11/2022   Tobacco dependence 09/11/2022   GERD with esophagitis 09/02/2021   Acute kidney injury superimposed on CKD (HCC) 04/10/2021   Achilles tendon contracture, left 03/03/2018   Non-pressure chronic ulcer of other part of left foot limited to breakdown of skin (HCC) 02/02/2018   History of right below knee amputation (HCC) 12/22/2017   Acute posthemorrhagic anemia    Falls 11/24/2017   Essential hypertension 11/24/2017   Anemia 11/24/2017   Uncontrolled type 2 diabetes mellitus with hyperglycemia, with long-term current use of insulin (HCC)  10/11/2017   ARF (acute renal failure) (HCC) 10/11/2017    Orientation RESPIRATION BLADDER Height & Weight     Self, Time, Situation, Place  Normal Indwelling catheter Weight: 107 kg Height:  6\' 5"  (195.6 cm)  BEHAVIORAL SYMPTOMS/MOOD NEUROLOGICAL BOWEL NUTRITION STATUS      Continent Diet (Renal diet with 1200cc fluid restriction)  AMBULATORY STATUS COMMUNICATION OF NEEDS Skin    (Rt BKA; has prosthesis) Verbally Normal                       Personal Care Assistance Level of Assistance  Bathing, Feeding, Dressing Bathing Assistance: Maximum assistance Feeding assistance: Independent Dressing Assistance: Limited assistance     Functional Limitations Info             SPECIAL CARE FACTORS FREQUENCY  PT (By licensed PT), OT (By licensed OT)     PT Frequency: 5x weekly OT Frequency: 5x weekly            Contractures Contractures Info: Not present    Additional Factors Info  Insulin Sliding Scale Code Status Info: Full Allergies Info: Iron- N/V; Tape- rash; use paper tape   Insulin Sliding Scale Info: Novolog 0-6 units sub-Q TID       Current Medications (10/19/2023):  This is the current hospital active medication list Current Facility-Administered Medications  Medication Dose Route Frequency Provider Last Rate Last Admin   acetaminophen (TYLENOL) tablet 650 mg  650 mg Oral Q6H Schuh, McKenzi P, PA-C   650 mg at 10/19/23 1646   [START ON 10/20/2023] aspirin tablet 325 mg  325 mg Oral Daily  Schuh, McKenzi P, PA-C       atorvastatin (LIPITOR) tablet 40 mg  40 mg Oral QHS Schuh, McKenzi P, PA-C   40 mg at 10/18/23 2045   baclofen (LIORESAL) tablet 10 mg  10 mg Oral Daily Schuh, McKenzi P, PA-C   10 mg at 10/19/23 1300   calcitRIOL (ROCALTROL) capsule 0.5 mcg  0.5 mcg Oral Daily Schuh, McKenzi P, PA-C   0.5 mcg at 10/19/23 1300   carvedilol (COREG) tablet 12.5 mg  12.5 mg Oral BID WC Schuh, McKenzi P, PA-C   12.5 mg at 10/19/23 1646   Chlorhexidine Gluconate  Cloth 2 % PADS 6 each  6 each Topical Q0600 [name], PA-C   6 each at 10/19/23 0508   [START ON 10/22/2023] Darbepoetin Alfa (ARANESP) injection 150 mcg  150 mcg Subcutaneous Q Fri-1800 Estanislado Emms, MD       diltiazem (CARDIZEM CD) 24 hr capsule 300 mg  300 mg Oral Daily Schuh, McKenzi P, PA-C   300 mg at 10/19/23 1259   FLUoxetine (PROZAC) capsule 10 mg  10 mg Oral Daily Schuh, McKenzi P, PA-C   10 mg at 10/19/23 1301   gabapentin (NEURONTIN) capsule 300 mg  300 mg Oral Daily Schuh, McKenzi P, PA-C   300 mg at 10/19/23 1259   hydrALAZINE (APRESOLINE) tablet 100 mg  100 mg Oral BID Schuh, McKenzi P, PA-C   100 mg at 10/19/23 1300   HYDROmorphone (DILAUDID) injection 0.5-1 mg  0.5-1 mg Intravenous Q4H PRN Schuh, McKenzi P, PA-C   1 mg at 10/19/23 0733   influenza vac split trivalent PF (FLULAVAL) injection 0.5 mL  0.5 mL Intramuscular Tomorrow-1000 Schuh, McKenzi P, PA-C       insulin aspart (novoLOG) injection 0-6 Units  0-6 Units Subcutaneous TID WC Schuh, McKenzi P, PA-C   3 Units at 10/19/23 1646   [START ON 10/20/2023] iron sucrose (VENOFER) 100 mg in sodium chloride 0.9 % 100 mL IVPB  100 mg Intravenous Q M,W,F-HD Hammons, Kimberly B, RPH       metoCLOPramide (REGLAN) tablet 5-10 mg  5-10 mg Oral Q8H PRN Schuh, McKenzi P, PA-C       Or   metoCLOPramide (REGLAN) injection 5-10 mg  5-10 mg Intravenous Q8H PRN Schuh, McKenzi P, PA-C       mupirocin ointment (BACTROBAN) 2 % 1 Application  1 Application Nasal BID Loel Dubonnet P, PA-C   1 Application at 10/18/23 2115   ondansetron (ZOFRAN) tablet 4 mg  4 mg Oral Q6H PRN Schuh, McKenzi P, PA-C   4 mg at 10/18/23 0844   Or   ondansetron (ZOFRAN) injection 4 mg  4 mg Intravenous Q6H PRN Schuh, McKenzi P, PA-C   4 mg at 10/18/23 1446   oxyCODONE (Oxy IR/ROXICODONE) immediate release tablet 10-15 mg  10-15 mg Oral Q4H PRN Schuh, McKenzi P, PA-C   15 mg at 10/19/23 0456   oxyCODONE (Oxy IR/ROXICODONE) immediate release tablet 5-10 mg  5-10  mg Oral Q4H PRN Schuh, McKenzi P, PA-C       phenol (CHLORASEPTIC) mouth spray 1 spray  1 spray Mouth/Throat PRN Schuh, McKenzi P, PA-C       polyethylene glycol (MIRALAX / GLYCOLAX) packet 17 g  17 g Oral Daily Everhart, Kirstie, DO   17 g at 10/19/23 1301   senna (SENOKOT) tablet 8.6 mg  1 tablet Oral Daily Everhart, Kirstie, DO   8.6 mg at 10/19/23 1259   sevelamer carbonate (RENVELA) tablet  1,600 mg  1,600 mg Oral TID WC Schuh, McKenzi P, PA-C   1,600 mg at 10/19/23 1307   sodium chloride flush (NS) 0.9 % injection 3 mL  3 mL Intravenous Q12H Schuh, McKenzi P, PA-C   3 mL at 10/18/23 2115   Vitamin D (Ergocalciferol) (DRISDOL) 1.25 MG (50000 UNIT) capsule 50,000 Units  50,000 Units Oral Q7 days [name], PA-C   50,000 Units at 10/15/23 2302     Discharge Medications: Please see discharge summary for a list of discharge medications.  Relevant Imaging Results:  Relevant Lab Results:   Additional Information New dialysis patient  Quintella Baton, RN, BSN  Trauma/Neuro ICU Case Manager [phone]

## 2023-10-19 NOTE — Plan of Care (Signed)

## 2023-10-20 ENCOUNTER — Inpatient Hospital Stay (HOSPITAL_COMMUNITY)
Admission: RE | Admit: 2023-10-20 | Discharge: 2023-10-20 | Disposition: A | Discharge status: Home / Self Care | Payer: 59 | Source: Ambulatory Visit | Attending: Internal Medicine | Admitting: Internal Medicine

## 2023-10-20 ENCOUNTER — Encounter (HOSPITAL_COMMUNITY): Payer: Self-pay | Admitting: Vascular Surgery

## 2023-10-20 DIAGNOSIS — S72001A Fracture of unspecified part of neck of right femur, initial encounter for closed fracture: Secondary | ICD-10-CM | POA: Diagnosis not present

## 2023-10-20 LAB — CBC
HCT: 25.8 % — ABNORMAL LOW (ref 39.0–52.0)
Hemoglobin: 8 g/dL — ABNORMAL LOW (ref 13.0–17.0)
MCH: 28.1 pg (ref 26.0–34.0)
MCHC: 31 g/dL (ref 30.0–36.0)
MCV: 90.5 fL (ref 80.0–100.0)
Platelets: 152 10*3/uL (ref 150–400)
RBC: 2.85 MIL/uL — ABNORMAL LOW (ref 4.22–5.81)
RDW: 15.7 % — ABNORMAL HIGH (ref 11.5–15.5)
WBC: 12.7 10*3/uL — ABNORMAL HIGH (ref 4.0–10.5)
nRBC: 0 % (ref 0.0–0.2)

## 2023-10-20 LAB — RENAL FUNCTION PANEL
Albumin: 2.1 g/dL — ABNORMAL LOW (ref 3.5–5.0)
Anion gap: 14 (ref 5–15)
BUN: 69 mg/dL — ABNORMAL HIGH (ref 6–20)
CO2: 22 mmol/L (ref 22–32)
Calcium: 7.8 mg/dL — ABNORMAL LOW (ref 8.9–10.3)
Chloride: 99 mmol/L (ref 98–111)
Creatinine, Ser: 7.97 mg/dL — ABNORMAL HIGH (ref 0.61–1.24)
GFR, Estimated: 8 mL/min — ABNORMAL LOW (ref >60)
Glucose, Bld: 205 mg/dL — ABNORMAL HIGH (ref 70–99)
Phosphorus: 7.3 mg/dL — ABNORMAL HIGH (ref 2.5–4.6)
Potassium: 4.7 mmol/L (ref 3.5–5.1)
Sodium: 135 mmol/L (ref 135–145)

## 2023-10-20 LAB — GLUCOSE, CAPILLARY
Glucose-Capillary: 130 mg/dL — ABNORMAL HIGH (ref 70–99)
Glucose-Capillary: 146 mg/dL — ABNORMAL HIGH (ref 70–99)
Glucose-Capillary: 182 mg/dL — ABNORMAL HIGH (ref 70–99)
Glucose-Capillary: 189 mg/dL — ABNORMAL HIGH (ref 70–99)
Glucose-Capillary: 206 mg/dL — ABNORMAL HIGH (ref 70–99)
Glucose-Capillary: 270 mg/dL — ABNORMAL HIGH (ref 70–99)

## 2023-10-20 LAB — HEPATITIS B SURFACE ANTIBODY, QUANTITATIVE: Hep B S AB Quant (Post): <3.5 m[IU]/mL — ABNORMAL LOW

## 2023-10-20 LAB — PARATHYROID HORMONE, INTACT (NO CA): PTH: 93 pg/mL — ABNORMAL HIGH (ref 15–65)

## 2023-10-20 MED ORDER — LIDOCAINE-PRILOCAINE 2.5-2.5 % EX CREA: 1.0000 | TOPICAL_CREAM | CUTANEOUS | Status: DC | PRN

## 2023-10-20 MED ORDER — SENNA 8.6 MG PO TABS
1.0000 | ORAL_TABLET | Freq: Two times a day (BID) | ORAL | Status: DC
  Administered 2023-10-20 – 2023-10-26 (×12): 8.6 mg via ORAL
  Filled 2023-10-20 (×12): qty 1

## 2023-10-20 MED ORDER — HEPARIN SODIUM (PORCINE) 1000 UNIT/ML DIALYSIS: 1000.0000 [IU] | INTRAMUSCULAR | Status: DC | PRN

## 2023-10-20 MED ORDER — VITAMIN D (ERGOCALCIFEROL) 1.25 MG (50000 UNIT) PO CAPS: 50000.0000 [IU] | ORAL_CAPSULE | ORAL | 0 refills | Status: DC

## 2023-10-20 MED ORDER — HEPARIN SODIUM (PORCINE) 1000 UNIT/ML IJ SOLN
3800.0000 [IU] | Freq: Once | INTRAMUSCULAR | Status: AC
  Administered 2023-10-20: 3800 [IU]
  Filled 2023-10-20: qty 4

## 2023-10-20 MED ORDER — PENTAFLUOROPROP-TETRAFLUOROETH EX AERO: 1.0000 | INHALATION_SPRAY | CUTANEOUS | Status: DC | PRN

## 2023-10-20 MED ORDER — OXYCODONE HCL 10 MG PO TABS: 5.0000 mg | ORAL_TABLET | ORAL | 0 refills | Status: DC | PRN

## 2023-10-20 MED ORDER — ALTEPLASE 2 MG IJ SOLR: 2.0000 mg | Freq: Once | INTRAMUSCULAR | Status: DC | PRN

## 2023-10-20 MED ORDER — ANTICOAGULANT SODIUM CITRATE 4% (200MG/5ML) IV SOLN: 5.0000 mL | Status: DC | PRN

## 2023-10-20 MED ORDER — LIDOCAINE HCL (PF) 1 % IJ SOLN: 5.0000 mL | INTRAMUSCULAR | Status: DC | PRN

## 2023-10-20 MED ORDER — ASPIRIN 325 MG PO TABS: 325.0000 mg | ORAL_TABLET | Freq: Every day | ORAL | 0 refills | Status: DC

## 2023-10-20 MED ORDER — POLYETHYLENE GLYCOL 3350 17 G PO PACK
17.0000 g | PACK | Freq: Two times a day (BID) | ORAL | Status: DC
  Administered 2023-10-20 – 2023-10-23 (×7): 17 g via ORAL
  Filled 2023-10-20 (×7): qty 1

## 2023-10-20 MED ORDER — TAMSULOSIN HCL 0.4 MG PO CAPS
0.4000 mg | ORAL_CAPSULE | Freq: Every day | ORAL | Status: DC
  Administered 2023-10-20 – 2023-10-26 (×7): 0.4 mg via ORAL
  Filled 2023-10-20 (×8): qty 1

## 2023-10-20 NOTE — Progress Notes (Signed)
PT Cancellation Note  Patient Details Name: [name] [name] MRN: [id number] DOB: [birth year]   Cancelled Treatment:    Reason Eval/Treat Not Completed: (P) Patient at procedure or test/unavailable (Off unit at HD dept.) Will continue efforts per PT plan of care as schedule permits.   [name] M Munir Victorian 10/20/2023, 10:18 AM

## 2023-10-20 NOTE — Assessment & Plan Note (Addendum)
Stable, Hemoglobin 8.0 this morning.  Acute on chronic anemia during this admission suspected to be in the setting of anemia of chronic disease vs acute blood loss in the setting of fracture vs EPO deficiency in setting of progressed CKD.  -Transfusion threshold <7 -Iron infusion on HD days x 5 doses -Appreciate ongoing Nephro recs

## 2023-10-20 NOTE — Progress Notes (Signed)
Daily Progress Note Intern Pager: [phone]  Patient name: [name] [name] Medical record number: [id number] Date of birth: [birth year] Age: 52 y.o. Gender: male  Primary Care Provider: [name], MD Consultants: Orthopedic Surgery, Nephrology, Interventional Radiology  Code Status: Full   Pt Overview and Major Events to Date:  11/20 - Admitted to FMTS 11/22 - Surgical fixation of fracture, TDC placement per IR 11/23 - First HD session 11/26 - AV fistula placement  Assessment and Plan:  52 y.o. male with a pertinent PMH of frequent falls, CKD stage IV, MDD, R BKA, T2DM, and PAD who presented with R intertrochanteric proximal femur fracture and was admitted for fixation of fracture and falls workup. Now s/p fracture fixation on 11/22 and TDC placement on 11/22, and AV fistula placement 11/26.  Pending establishment of HD outpatient, will continue inpatient monitoring for now. Assessment & Plan Closed intertrochanteric fracture of right hip (HCC) Secondary to mechanical fall.  Frequent falls at home thought to be in the setting of BKA, uremia, deconditioning. Stable s/p fracture fixation on 11/22.   -Appreciate ongoing Ortho recs  -Dilaudid 0.5-1 mg Q4h PRN -Oxycodone 5-10 q4 PRN  -Oxycodone 10-15 q4 PRN -Continue calcitriol 0.11mcg BID -PT/OT eval and treat -Fall precautions Acute kidney injury superimposed on CKD (HCC) Creatinine at known peak, good urine output yesterday.  Received AV fistula 11/27. Working on establishing outpatient dialysis.  -Appreciate ongoing Nephro recs -Holding Farxiga for now -Avoid nephrotoxic agents -Strict I&Os -Continue calcitriol and Renvela -RFP daily Anemia Stable, Hemoglobin 8.0 this morning.  Acute on chronic anemia during this admission suspected to be in the setting of anemia of chronic disease vs acute blood loss in the setting of fracture vs EPO deficiency in setting of progressed CKD.  -Transfusion threshold <7 -Iron  infusion on HD days x 5 doses -Appreciate ongoing Nephro recs Controlled diabetes mellitus type 2 with complications (HCC) A1c 6.1%.  Has insulin pump at home where he gets around 12 units/day, but it is removed currently.  CBGs have continued to decrease, last two were in the 100s. -Very sensitive SSI -CBGs ACHS Urinary retention Foley catheter now placed.  1.7L OP in last 24h.  -Strict I&Os -Foley care per nursing   Chronic and Stable Problems:  Hypertension: Continue home carvedilol 12.5 mg BID, diltiazem 300 mg daily, hydralazine 100 mg BID HFpEF: Holding Lasix Nicotine dependence: Half pack a day, declines nicotine patch PAD: S/p right BKA, encourage nicotine cessation Neuropathy: Continue gabapentin 300mg  daily Hyperlipidemia: Continue atorvastatin 400mg  daily MDD: Continue fluoxetine 10 mg daily    FEN/GI: renal with fluid restriction PPx: aspirin, heparin with dialysis Dispo:SNF pending clinical improvement   Subjective:  No acute events overnight. No concerns currently.  Objective: Temp:  [97.9 F (36.6 C)-98.6 F (37 C)] 98.3 F (36.8 C) (11/27 0839) Pulse Rate:  [66-86] 70 (11/27 1100) Resp:  [9-20] 10 (11/27 1100) BP: (145-176)/(77-85) 170/83 (11/27 1100) SpO2:  [93 %-98 %] 93 % (11/27 1100) Weight:  [107.3 kg-107.5 kg] 107.5 kg (11/27 0839) Physical Exam: General: well appearing, no acute distress, at HD Cardiovascular: RRR, no murmurs Respiratory: no increased WOB on RA Extremities: 2+ DP pulses LLE. TDC C/D/I. No increased swelling to R stump.  Laboratory: Most recent CBC Lab Results  Component Value Date   WBC 12.7 (H) 10/20/2023   HGB 8.0 (L) 10/20/2023   HCT 25.8 (L) 10/20/2023   MCV 90.5 10/20/2023   PLT 152 10/20/2023   Most recent BMP  Latest Ref Rng & Units 10/20/2023    6:07 AM  BMP  Glucose 70 - 99 mg/dL 284   BUN 6 - 20 mg/dL 69   Creatinine 1.32 - 1.24 mg/dL 4.40   Sodium 102 - 725 mmol/L 135   Potassium 3.5 - 5.1 mmol/L 4.7    Chloride 98 - 111 mmol/L 99   CO2 22 - 32 mmol/L 22   Calcium 8.9 - 10.3 mg/dL 7.8    Akeema Broder, DO 10/20/2023, 11:53 AM  PGY-1, [facility] FPTS Intern pager: [phone], text pages welcome Secure chat group [facility] Teaching Service

## 2023-10-20 NOTE — Progress Notes (Signed)
[facility]  Subjective  -  no new issues, left AC incision is sore.   Objective (!) 168/80 78 98.2 F (36.8 C) (Oral) 17 95%  Intake/Output Summary (Last 24 hours) at 10/20/2023 0637 Last data filed at 10/20/2023 0432 Gross per 24 hour  Intake 303 ml  Output 2270 ml  Net -1967 ml    Left UE palpable thrill  in fistula, incision healing well without signs of infection.  No erythema or abnormal edema.  Palpable radial pulse Lungs non labored breathing   Assessment/Planning: POD # 1 Left BC AV fistula by Dr. Hetty Blend [facility] placed by IR 10/15/23    Plan for f/u in 5-6 weeks with duplex of fistula to check for maturity.  Do not stick fistula for 12 weeks total.   Stable ost op disposition without signs of steal.  Mosetta Pigeon 10/20/2023 6:37 AM --  Laboratory Lab Results: Recent Labs    10/18/23 0645 10/19/23 0602  WBC 8.1 9.1  HGB 8.3* 8.2*  HCT 26.1* 26.2*  PLT 149* 150   BMET Recent Labs    10/18/23 0645 10/19/23 0602  NA 134* 137  K 4.7 4.3  CL 101 101  CO2 23 23  GLUCOSE 116* 108*  BUN 59* 46*  CREATININE 7.90* 6.26*  CALCIUM 7.8* 8.0*    COAG Lab Results  Component Value Date   INR 1.0 10/13/2023   INR 0.9 09/01/2021   INR 1.0 04/23/2021   No results found for: "PTT"

## 2023-10-20 NOTE — Assessment & Plan Note (Addendum)
Secondary to mechanical fall.  Frequent falls at home thought to be in the setting of BKA, uremia, deconditioning. Stable s/p fracture fixation on 11/22.   -Appreciate ongoing Ortho recs  -Dilaudid 0.5-1 mg Q4h PRN -Oxycodone 5-10 q4 PRN  -Oxycodone 10-15 q4 PRN -Continue calcitriol 0.15mcg BID -PT/OT eval and treat -Fall precautions

## 2023-10-20 NOTE — Assessment & Plan Note (Signed)
>>  ASSESSMENT AND PLAN FOR T2DM (TYPE 2 DIABETES MELLITUS) (HCC) WRITTEN ON 10/20/2023 11:57 AM BY EVERHART, KIRSTIE, DO  A1c 6.1%.  Has insulin  pump at home where he gets around 12 units/day, but it is removed currently.  CBGs have continued to decrease, last two were in the 100s. -Very sensitive SSI -CBGs ACHS

## 2023-10-20 NOTE — Assessment & Plan Note (Addendum)
A1c 6.1%.  Has insulin pump at home where he gets around 12 units/day, but it is removed currently.  CBGs have continued to decrease, last two were in the 100s. -Very sensitive SSI -CBGs ACHS

## 2023-10-20 NOTE — Progress Notes (Signed)
Navigator following to assist with out-pt HD clinic placement at d/c. Will clip pt once snf placement is confirmed in order for pt to be placed at most appropriate clinic. Will assist as needed.   Olivia Canter Renal Navigator [phone]

## 2023-10-20 NOTE — Progress Notes (Signed)
[facility] NEPHROLOGY PROGRESS NOTE  Assessment/ Plan: Pt is a 52 y.o. yo male with a history of DM 2, HTN, urinary retention, PAD (status post right BKA), HLD CKD5, presented after a fall and hip fracture.  # Fall/right intertrochanteric femur fracture status post IM nail/operative repair on 11/22.  Further management per orthopedic surgery.  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  Agreed to start dialysis.  Status post right IJ TDC by IR on 11/22.  First HD on 11/23.  He had left arm brachiobasilic AVF creation on 11/26 with Dr. Hetty Blend - HD today (off schedule due to holiday) and then anticipate next treatment on 11/30 per TTS schedule    - I have reached out to HD SW to initiate CLIP process.  We are waiting on the choice of SNF to be able to locate an appropriate dialysis unit  # Anemia of CKD and iron deficiency.  Iron saturation of 8%.   - IV iron is ordered x 5 doses - on aranesp 100 mcg every Friday.  He was previously on ESA outpatient.  Increased aranesp to 150 mcg every Friday while inpatient for now    # HTN/volume: optimize volume with HD.   - Note that with regard to discharge meds, we have stopped amlodipine as he is on concurrent diltiazem and should not be on both    # CKD-MBD/hyperphosphatemia: Increased sevelamer, intact PTH is pending.    Disposition - from a strictly renal standpoint continue inpatient monitoring; we are starting HD and awaiting an outpatient HD unit (need to know what SNF he will go to so that we can get the HD unit arranged)   Subjective:  He had 2.1 liters UOP over 11/26.  Seen and examined on dialysis.  Procedure supervised.  Blood pressure 165/82 and HR 70.  Tunn catheter in use.  Tolerating goal but was dizzy yesterday - reduced to keep even for rest of the treatment (goal had been just 0.5 kg).  He states that he wasn't able to fit his prosthesis on yesterday so he does feel that his legs are swollen.  He  states that he wasn't able to urinate earlier so had the foley placed.  He states that he has been asking for his flomax but hasn't gotten it.   Review of systems:    Nausea yesterday with the dizzy spell  Dizziness yesterday when he tried to sit up Denies shortness of breath or chest pain  Still making quite a bit of urine     Objective Vital signs in last 24 hours: Vitals:   10/20/23 0817 10/20/23 0839 10/20/23 0846 10/20/23 0900  BP: (!) 173/85 (!) 173/81 (!) 165/81 (!) 168/80  Pulse: 73 70 68 67  Resp: 18 (!) 9 12 12   Temp: 98 F (36.7 C) 98.3 F (36.8 C)    TempSrc: Oral     SpO2: 97% 93% 94% 97%  Weight:  107.5 kg    Height:       Weight change: -2.451 kg  Intake/Output Summary (Last 24 hours) at 10/20/2023 0924 Last data filed at 10/20/2023 0432 Gross per 24 hour  Intake 303 ml  Output 2070 ml  Net -1767 ml      Labs: RENAL PANEL Recent Labs  Lab 10/15/23 0733 10/16/23 0527 10/17/23 0621 10/18/23 0645 10/19/23 0602 10/20/23 0607  NA 136 135 135 134* 137 135  K 5.1 5.5* 5.1 4.7 4.3 4.7  CL 110 106 103  101 101 99  CO2 16* 16* 19* 23 23 22   GLUCOSE 140* 261* 138* 116* 108* 205*  BUN 81* 90* 76* 59* 46* 69*  CREATININE 10.40* 11.23* 9.56* 7.90* 6.26* 7.97*  CALCIUM 7.6* 7.9* 7.9* 7.8* 8.0* 7.8*  PHOS 6.6*  --  7.9* 6.8* 6.1* 7.3*  ALBUMIN 2.2*  --  2.2* 2.2* 2.2* 2.1*    Liver Function Tests: Recent Labs  Lab 10/14/23 0325 10/15/23 0733 10/18/23 0645 10/19/23 0602 10/20/23 0607  AST 8*  --   --   --   --   ALT 10  --   --   --   --   ALKPHOS 50  --   --   --   --   BILITOT 0.6  --   --   --   --   PROT 5.2*  --   --   --   --   ALBUMIN 2.3*   < > 2.2* 2.2* 2.1*   < > = values in this interval not displayed.   Recent Labs  Lab 10/13/23 2008  LIPASE 50   No results for input(s): "AMMONIA" in the last 168 hours. CBC: Recent Labs    07/01/23 1223 07/01/23 1228 07/29/23 1155 07/29/23 1203 08/26/23 1253 08/26/23 1300  10/07/23 1217 10/07/23 1219 10/14/23 0325 10/14/23 1348 10/16/23 1936 10/17/23 0621 10/18/23 0645 10/19/23 0602 10/20/23 0607  HGB  --    < >  --    < >  --    < >  --    < > 6.3*   < > 7.4* 8.2* 8.3* 8.2* 8.0*  MCV  --   --   --   --   --   --   --    < > 92.6   < >  --  90.7 90.3 89.7 90.5  FERRITIN 14*  --  17*  --  24  --  50  --  39  --   --   --   --   --   --   TIBC 349  --  354  --  305  --  260  --  225*  --   --   --   --   --   --   IRON 76  --  91  --  126  --  79  --  19*  --   --   --   --   --   --    < > = values in this interval not displayed.    Cardiac Enzymes: No results for input(s): "CKTOTAL", "CKMB", "CKMBINDEX", "TROPONINI" in the last 168 hours. CBG: Recent Labs  Lab 10/19/23 1623 10/19/23 2010 10/20/23 0011 10/20/23 0449 10/20/23 0814  GLUCAP 283* 272* 270* 206* 189*    Iron Studies:  No results for input(s): "IRON", "TIBC", "TRANSFERRIN", "FERRITIN" in the last 72 hours.  Studies/Results: No results found.  Medications: Infusions:  anticoagulant sodium citrate     iron sucrose      Scheduled Medications:  acetaminophen  650 mg Oral Q6H   aspirin  325 mg Oral Daily   atorvastatin  40 mg Oral QHS   baclofen  10 mg Oral Daily   calcitRIOL  0.5 mcg Oral Daily   carvedilol  12.5 mg Oral BID WC   Chlorhexidine Gluconate Cloth  6 each Topical Q0600   Chlorhexidine Gluconate Cloth  6 each Topical Q0600   [START ON 10/22/2023] darbepoetin (ARANESP) injection -  DIALYSIS  150 mcg Subcutaneous Q Fri-1800   diltiazem  300 mg Oral Daily   FLUoxetine  10 mg Oral Daily   gabapentin  300 mg Oral Daily   heparin sodium (porcine)  3,800 Units Intracatheter Once   hydrALAZINE  100 mg Oral BID   influenza vac split trivalent PF  0.5 mL Intramuscular Tomorrow-1000   insulin aspart  0-6 Units Subcutaneous TID WC   mupirocin ointment  1 Application Nasal BID   polyethylene glycol  17 g Oral Daily   senna  1 tablet Oral Daily   sevelamer carbonate   1,600 mg Oral TID WC   sodium chloride flush  3 mL Intravenous Q12H   Vitamin D (Ergocalciferol)  50,000 Units Oral Q7 days    have reviewed scheduled and prn medications.  Physical Exam:     General adult male in bed in no acute distress HEENT normocephalic atraumatic extraocular movements intact sclera anicteric Neck supple trachea midline Lungs clear to auscultation bilaterally normal work of breathing at rest on room air Heart S1S2 no rub Abdomen soft nontender nondistended Extremities right BKA; no edema  Psych blunted affect - usual for him Neuro alert and oriented x 3 provides hx and follows commands Access RIJ tunn catheter; LUE AVF with bruit and thrill    [name] [name] 10/20/2023,9:47 AM  LOS: 7 days

## 2023-10-20 NOTE — Progress Notes (Signed)
Physical Therapy Treatment Patient Details Name: [name] [name] MRN: [id number] DOB: [birth year] Today's Date: 10/20/2023   History of Present Illness Pt is a 52 y/o male admitted on 10/13/23 after presenting with R intertrochanteric fracture from a fall outside of a gas station. Pt is s/p RLE fixation on 10/15/23.  PMH: frequent falls, CKD IV, MDD, R BKA, DM2, PAD    PT Comments  Pt received in supine, agreeable to therapy session with encouragement, with emphasis on safety with transfers and benefits of mobility. Pt needing up to minA to stand and pivot from elevated bed height<>RW and RW>recliner chair. Pt standing tolerance limited due to c/o pain and some dizziness, plan to check orthostatic BP next session if pt able to stand long enough to tolerate BP reading. BP stable supine/sitting pre/post transfer. Pt will continue to benefit from skilled rehab in a post acute setting <3 hours/day to maximize functional gains before returning home.     If plan is discharge home, recommend the following: A lot of help with walking and/or transfers;A lot of help with bathing/dressing/bathroom;Assist for transportation;Direct supervision/assist for financial management;Assistance with cooking/housework;Help with stairs or ramp for entrance   Can travel by private vehicle     No  Equipment Recommendations  None recommended by PT    Recommendations for Other Services       Precautions / Restrictions Precautions Precautions: Fall Precaution Comments: RLE prosthesis (in room), chronic R BKA Restrictions Weight Bearing Restrictions: Yes RLE Weight Bearing: Weight bearing as tolerated     Mobility  Bed Mobility Overal bed mobility: Needs Assistance Bed Mobility: Supine to Sit     Supine to sit: Supervision, HOB elevated, Used rails     General bed mobility comments: Increased time/effort to perform, pt using bed rails.    Transfers Overall transfer level: Needs assistance Equipment  used: Rolling walker (2 wheels) Transfers: Sit to/from Stand, Bed to chair/wheelchair/BSC Sit to Stand: Min assist, From elevated surface   Step pivot transfers: Min assist, From elevated surface       General transfer comment: Cues for upright posture, RW management, safety. Chair pulled closer while pt pivoting due to his c/o RLE pain. Pt reports mild dizziness but BP stable seated pre/post transfer, too painful to attempt standing BP    Ambulation/Gait               General Gait Details: defer after pivot transfer, pain level too high, RN notified. Ice to his R hip in chair   Stairs             Wheelchair Mobility     Tilt Bed    Modified Rankin (Stroke Patients Only)       Balance Overall balance assessment: History of Falls, Needs assistance Sitting-balance support: Feet supported, No upper extremity supported Sitting balance-Leahy Scale: Fair Sitting balance - Comments: supervision static sitting EOB   Standing balance support: Bilateral upper extremity supported, During functional activity, Reliant on assistive device for balance Standing balance-Leahy Scale: Poor Standing balance comment: RW and external assist for safety                            Cognition Arousal: Alert Behavior During Therapy: Flat affect Overall Cognitive Status: No family/caregiver present to determine baseline cognitive functioning  General Comments: Very limited conversation during session. Followed all direction and commands appropriately. With much encouragement, pt states he likes [employer], and sleeping.        Exercises      General Comments General comments (skin integrity, edema, etc.): Pt able to don prosthetic after he removed extra sock layer and some effort at positioning it from elevated height bed 2/2 pt height. Pt given some assist to don his L shoe but he was able to complete pulling it over his  heel after hospital sock removed due to being too tight.      Pertinent Vitals/Pain Pain Assessment Pain Assessment: Faces Faces Pain Scale: Hurts even more Pain Location: Right hip with weight bearing Pain Descriptors / Indicators: Discomfort, Grimacing, Other (Comment) (states "owch" multiple times while pivoting) Pain Intervention(s): Limited activity within patient's tolerance, Monitored during session, Repositioned, Patient requesting pain meds-RN notified, Ice applied    Home Living                          Prior Function            PT Goals (current goals can now be found in the care plan section) Acute Rehab PT Goals PT Goal Formulation: With patient Time For Goal Achievement: 11/01/23 Progress towards PT goals: Progressing toward goals    Frequency    Min 1X/week      PT Plan      Co-evaluation              AM-PAC PT "6 Clicks" Mobility   Outcome Measure  Help needed turning from your back to your side while in a flat bed without using bedrails?: None Help needed moving from lying on your back to sitting on the side of a flat bed without using bedrails?: A Little Help needed moving to and from a bed to a chair (including a wheelchair)?: A Lot Help needed standing up from a chair using your arms (e.g., wheelchair or bedside chair)?: A Lot Help needed to walk in hospital room?: Total Help needed climbing 3-5 steps with a railing? : Total 6 Click Score: 13    End of Session Equipment Utilized During Treatment: Gait belt Activity Tolerance: Patient tolerated treatment well;Patient limited by pain Patient left: in chair;with call bell/phone within reach;with chair alarm set Nurse Communication: Mobility status;Patient requests pain meds PT Visit Diagnosis: Muscle weakness (generalized) (M62.81);Unsteadiness on feet (R26.81);Other abnormalities of gait and mobility (R26.89);History of falling (Z91.81);Difficulty in walking, not elsewhere  classified (R26.2)     Time: 9147-8295 PT Time Calculation (min) (ACUTE ONLY): 29 min  Charges:    $Therapeutic Activity: 23-37 mins PT General Charges $$ ACUTE PT VISIT: 1 Visit                     Maddeline Roorda P., PTA Acute Rehabilitation Services Secure Chat Preferred 9a-5:30pm Office: [phone]    Dorathy Kinsman [facility] 10/20/2023, 6:23 PM

## 2023-10-20 NOTE — Assessment & Plan Note (Addendum)
Creatinine at known peak, good urine output yesterday.  Received AV fistula 11/27. Working on establishing outpatient dialysis.  -Appreciate ongoing Nephro recs -Holding Farxiga for now -Avoid nephrotoxic agents -Strict I&Os -Continue calcitriol and Renvela -RFP daily

## 2023-10-20 NOTE — Progress Notes (Signed)
Received patient in bed to unit.  Alert and oriented.  Informed consent signed and in chart.   TX duration:3.5 Hours  Patient tolerated well.  Transported back to the room  Alert, without acute distress.  Hand-off given to patient's nurse.   Access used: R internal jugular HD Cath Access issues: none  Total UF removed: 0mL Medication(s) given: Oxycodone, Scheduled Tylenol, Iron Sucrose P  10/20/23 1230  Vitals  Temp 98.4 F (36.9 C)  Temp Source Oral  BP (!) 179/86  MAP (mmHg) 111  Pulse Rate 75  ECG Heart Rate 76  Resp 11  Oxygen Therapy  SpO2 97 %  O2 Device Room Air  During Treatment Monitoring  HD Safety Checks Performed Yes  Intra-Hemodialysis Comments Tx completed;Tolerated well  Dialysis Fluid Bolus Normal Saline  Bolus Amount (mL) 300 mL  Post Treatment  Dialyzer Clearance Lightly streaked  Liters Processed 84  Fluid Removed (mL) 0 mL  Tolerated HD Treatment Yes  Hemodialysis Catheter Right Internal jugular Double lumen Permanent (Tunneled)  Placement Date/Time: 10/15/23 1500   Serial / Lot #: [id number]  Expiration Date: 05/30/28  Time Out: Correct patient;Correct site;Correct procedure  Maximum sterile barrier precautions: Hand hygiene;Cap;Mask;Sterile gown;Sterile gloves;Large sterile ...  Site Condition No complications  Blue Lumen Status Flushed;Heparin locked;Dead end cap in place  Red Lumen Status Flushed;Heparin locked;Dead end cap in place  Purple Lumen Status N/A  Catheter fill solution Heparin 1000 units/ml  Catheter fill volume (Arterial) 1.9 cc  Catheter fill volume (Venous) 1.9  Dressing Type Transparent  Dressing Status Antimicrobial disc in place;Clean, Dry, Intact  Interventions  (deaccessed)  Drainage Description None  Dressing Change Due 10/22/23  Post treatment catheter status Capped and Clamped     Stacie Glaze LPN [facility]

## 2023-10-20 NOTE — Assessment & Plan Note (Addendum)
Foley catheter now placed.  1.7L OP in last 24h.  -Strict I&Os -Foley care per nursing

## 2023-10-20 NOTE — Discharge Instructions (Addendum)
[facility]  Discharge Instructions  AV Fistula or Graft Surgery for Dialysis Access  Please refer to the following instructions for your post-procedure care. Your surgeon or physician assistant will discuss any changes with you.  Activity  You may drive the day following your surgery, if you are comfortable and no longer taking prescription pain medication. Resume full activity as the soreness in your incision resolves.  Bathing/Showering  You may shower after you go home. Keep your incision dry for 48 hours. Do not soak in a bathtub, hot tub, or swim until the incision heals completely. You may not shower if you have a hemodialysis catheter.  Incision Care  Clean your incision with mild soap and water after 48 hours. Pat the area dry with a clean towel. You do not need a bandage unless otherwise instructed. Do not apply any ointments or creams to your incision. You may have skin glue on your incision. Do not peel it off. It will come off on its own in about one week. Your arm may swell a bit after surgery. To reduce swelling use pillows to elevate your arm so it is above your heart. Your doctor will tell you if you need to lightly wrap your arm with an ACE bandage.  Diet  Resume your normal diet. There are not special food restrictions following this procedure. In order to heal from your surgery, it is CRITICAL to get adequate nutrition. Your body requires vitamins, minerals, and protein. Vegetables are the best source of vitamins and minerals. Vegetables also provide the perfect balance of protein. Processed food has little nutritional value, so try to avoid this.  Medications  Resume taking all of your medications. If your incision is causing pain, you may take over-the counter pain relievers such as acetaminophen (Tylenol). If you were prescribed a stronger pain medication, please be aware these medications can cause nausea and constipation. Prevent  nausea by taking the medication with a snack or meal. Avoid constipation by drinking plenty of fluids and eating foods with high amount of fiber, such as fruits, vegetables, and grains.  Do not take Tylenol if you are taking prescription pain medications.  Follow up Your surgeon may want to see you in the office following your access surgery. If so, this will be arranged at the time of your surgery.  Please call us immediately for any of the following conditions:  Increased pain, redness, drainage (pus) from your incision site Fever of 101 degrees or higher Severe or worsening pain at your incision site Hand pain or numbness.  Reduce your risk of vascular disease:  Stop smoking. If you would like help, call [facility] at 1-800-QUIT-NOW ((308) 324-5418) or [place] at (352) [phone]  Manage your cholesterol Maintain a desired weight Control your diabetes Keep your blood pressure down  Dialysis  It will take several weeks to several months for your new dialysis access to be ready for use. Your surgeon will determine when it is okay to use it. Your nephrologist will continue to direct your dialysis. You can continue to use your Permcath until your new access is ready for use.   10/20/2023 [name] [name] [id number] [birth year]  Surgeon(s): Daria Pastures, MD  Procedure(s): LEFT BRACHIOCEPHALIC ARTERIOVENOUS (AV) FISTULA CREATION  x Do not stick fistula for 12 weeks    If you have any questions, please call the office at [phone].     Orthopaedic Trauma Service Discharge Instructions   General Discharge Instructions  WEIGHT  BEARING STATUS:weightbearing as tolerated right lower extremity   RANGE OF MOTION/ACTIVITY: ok for hip and knee motion as tolerated  Wound Care: You may remove your surgical dressing. Incisions can be left open to air if there is no drainage. Once the incision is completely dry and without drainage, it may be left open to air out.   Showering may begin post op day 3, (Monday 10/18/23).  Clean incision gently with soap and water.  DVT/PE prophylaxis: Aspirin 325 mg daily x 30 days  Diet: as you were eating previously.  Can use over the counter stool softeners and bowel preparations, such as Miralax, to help with bowel movements.  Narcotics can be constipating.  Be sure to drink plenty of fluids  PAIN MEDICATION USE AND EXPECTATIONS  You have likely been given narcotic medications to help control your pain.  After a traumatic event that results in an fracture (broken bone) with or without surgery, it is ok to use narcotic pain medications to help control one's pain.  We understand that everyone responds to pain differently and each individual patient will be evaluated on a regular basis for the continued need for narcotic medications. Ideally, narcotic medication use should last no more than 6-8 weeks (coinciding with fracture healing).   As a patient it is your responsibility as well to monitor narcotic medication use and report the amount and frequency you use these medications when you come to your office visit.   We would also advise that if you are using narcotic medications, you should take a dose prior to therapy to maximize you participation.  IF YOU ARE ON NARCOTIC MEDICATIONS IT IS NOT PERMISSIBLE TO OPERATE A MOTOR VEHICLE (MOTORCYCLE/CAR/TRUCK/MOPED) OR HEAVY MACHINERY DO NOT MIX NARCOTICS WITH OTHER CNS (CENTRAL NERVOUS SYSTEM) DEPRESSANTS SUCH AS ALCOHOL   STOP SMOKING OR USING NICOTINE PRODUCTS!!!!  As discussed nicotine severely impairs your body's ability to heal surgical and traumatic wounds but also impairs bone healing.  Wounds and bone heal by forming microscopic blood vessels (angiogenesis) and nicotine is a vasoconstrictor (essentially, shrinks blood vessels).  Therefore, if vasoconstriction occurs to these microscopic blood vessels they essentially disappear and are unable to deliver necessary nutrients to  the healing tissue.  This is one modifiable factor that you can do to dramatically increase your chances of healing your injury.    (This means no smoking, no nicotine gum, patches, etc)  DO NOT USE NONSTEROIDAL ANTI-INFLAMMATORY DRUGS (NSAID'S)  Using products such as Advil (ibuprofen), Aleve (naproxen), Motrin (ibuprofen) for additional pain control during fracture healing can delay and/or prevent the healing response.  If you would like to take over the counter (OTC) medication, Tylenol (acetaminophen) is ok.  However, some narcotic medications that are given for pain control contain acetaminophen as well. Therefore, you should not exceed more than 4000 mg of tylenol in a day if you do not have liver disease.  Also note that there are may OTC medicines, such as cold medicines and allergy medicines that my contain tylenol as well.  If you have any questions about medications and/or interactions please ask your doctor/PA or your pharmacist.      ICE AND ELEVATE INJURED/OPERATIVE EXTREMITY  Using ice and elevating the injured extremity above your heart can help with swelling and pain control.  Icing in a pulsatile fashion, such as 20 minutes on and 20 minutes off, can be followed.    Do not place ice directly on skin. Make sure there is a barrier between to  skin and the ice pack.    Using frozen items such as frozen peas works well as the conform nicely to the are that needs to be iced.  USE AN ACE WRAP OR TED HOSE FOR SWELLING CONTROL  In addition to icing and elevation, Ace wraps or TED hose are used to help limit and resolve swelling.  It is recommended to use Ace wraps or TED hose until you are informed to stop.    When using Ace Wraps start the wrapping distally (farthest away from the body) and wrap proximally (closer to the body)   Example: If you had surgery on your leg or thing and you do not have a splint on, start the ace wrap at the toes and work your way up to the thigh        If you had  surgery on your upper extremity and do not have a splint on, start the ace wrap at your fingers and work your way up to the upper arm  CALL THE OFFICE WITH ANY QUESTIONS OR CONCERNS: [phone]   VISIT OUR WEBSITE FOR ADDITIONAL INFORMATION: [email or web address]    Discharge Wound Care Instructions  Do NOT apply any ointments, solutions or lotions to pin sites or surgical wounds.  These prevent needed drainage and even though solutions like hydrogen peroxide kill bacteria, they also damage cells lining the pin sites that help fight infection.  Applying lotions or ointments can keep the wounds moist and can cause them to breakdown and open up as well. This can increase the risk for infection. When in doubt call the office.  If any drainage is noted, use foam dressings - These dressing supplies should be available at local medical supply stores [facility], [facility], etc) as well as Insurance claims handler ([facility], [facility], [employer], etc)  Once the incision is completely dry and without drainage, it may be left open to air out.  Showering may begin 36-48 hours later.  Cleaning gently with soap and water.

## 2023-10-21 DIAGNOSIS — S72001A Fracture of unspecified part of neck of right femur, initial encounter for closed fracture: Secondary | ICD-10-CM | POA: Diagnosis not present

## 2023-10-21 DIAGNOSIS — R339 Retention of urine, unspecified: Secondary | ICD-10-CM

## 2023-10-21 DIAGNOSIS — N185 Chronic kidney disease, stage 5: Secondary | ICD-10-CM | POA: Diagnosis not present

## 2023-10-21 DIAGNOSIS — Z794 Long term (current) use of insulin: Secondary | ICD-10-CM

## 2023-10-21 DIAGNOSIS — N179 Acute kidney failure, unspecified: Secondary | ICD-10-CM

## 2023-10-21 DIAGNOSIS — N189 Chronic kidney disease, unspecified: Secondary | ICD-10-CM

## 2023-10-21 DIAGNOSIS — E1165 Type 2 diabetes mellitus with hyperglycemia: Secondary | ICD-10-CM

## 2023-10-21 LAB — RENAL FUNCTION PANEL
Albumin: 2.2 g/dL — ABNORMAL LOW (ref 3.5–5.0)
Anion gap: 7 (ref 5–15)
BUN: 44 mg/dL — ABNORMAL HIGH (ref 6–20)
CO2: 26 mmol/L (ref 22–32)
Calcium: 7.7 mg/dL — ABNORMAL LOW (ref 8.9–10.3)
Chloride: 100 mmol/L (ref 98–111)
Creatinine, Ser: 5.83 mg/dL — ABNORMAL HIGH (ref 0.61–1.24)
GFR, Estimated: 11 mL/min — ABNORMAL LOW (ref >60)
Glucose, Bld: 140 mg/dL — ABNORMAL HIGH (ref 70–99)
Phosphorus: 5.7 mg/dL — ABNORMAL HIGH (ref 2.5–4.6)
Potassium: 4.3 mmol/L (ref 3.5–5.1)
Sodium: 133 mmol/L — ABNORMAL LOW (ref 135–145)

## 2023-10-21 LAB — GLUCOSE, CAPILLARY
Glucose-Capillary: 122 mg/dL — ABNORMAL HIGH (ref 70–99)
Glucose-Capillary: 218 mg/dL — ABNORMAL HIGH (ref 70–99)
Glucose-Capillary: 131 mg/dL — ABNORMAL HIGH (ref 70–99)
Glucose-Capillary: 241 mg/dL — ABNORMAL HIGH (ref 70–99)

## 2023-10-21 MED ORDER — FLUOXETINE HCL 20 MG PO CAPS
20.0000 mg | ORAL_CAPSULE | Freq: Every day | ORAL | Status: DC
  Administered 2023-10-22 – 2023-10-26 (×5): 20 mg via ORAL
  Filled 2023-10-21 (×5): qty 1

## 2023-10-21 MED ORDER — CALCITRIOL 0.25 MCG PO CAPS: 0.2500 ug | ORAL_CAPSULE | ORAL | Status: DC

## 2023-10-21 MED ORDER — HEPATITIS B VAC RECOMB ADJ 20 MCG/0.5ML IM SOSY
0.5000 mL | PREFILLED_SYRINGE | Freq: Once | INTRAMUSCULAR | Status: AC
  Administered 2023-10-21: 0.5 mL via INTRAMUSCULAR
  Filled 2023-10-21: qty 0.5

## 2023-10-21 MED ORDER — CALCITRIOL 0.25 MCG PO CAPS
0.2500 ug | ORAL_CAPSULE | ORAL | Status: DC
Start: 2023-10-23 — End: 2023-10-26
  Administered 2023-10-23: 0.25 ug via ORAL
  Filled 2023-10-21 (×3): qty 1

## 2023-10-21 NOTE — Plan of Care (Signed)
  Problem: Health Behavior/Discharge Planning: Goal: Ability to manage health-related needs will improve Outcome: Progressing   Problem: Metabolic: Goal: Ability to maintain appropriate glucose levels will improve Outcome: Progressing   Problem: Nutritional: Goal: Maintenance of adequate nutrition will improve Outcome: Progressing

## 2023-10-21 NOTE — Assessment & Plan Note (Signed)
Creatinine at known peak, good urine output yesterday.  Received AV fistula 11/27. Working on establishing outpatient dialysis.  -Appreciate ongoing Nephro recs -Holding Farxiga for now -Avoid nephrotoxic agents -Strict I&Os -Continue calcitriol and Renvela -RFP daily

## 2023-10-21 NOTE — Assessment & Plan Note (Signed)
A1c 6.1%.  Has insulin pump at home where he gets around 12 units/day, but it is removed currently.  CBGs have continued to decrease, last two were in the 100s. -Very sensitive SSI -CBGs ACHS

## 2023-10-21 NOTE — Assessment & Plan Note (Signed)
Stable, Hemoglobin 8.0 this morning.  Acute on chronic anemia during this admission suspected to be in the setting of anemia of chronic disease vs acute blood loss in the setting of fracture vs EPO deficiency in setting of progressed CKD.  -Transfusion threshold <7 -Iron infusion on HD days x 5 doses -Appreciate ongoing Nephro recs

## 2023-10-21 NOTE — Assessment & Plan Note (Addendum)
Secondary to mechanical fall.  Frequent falls at home thought to be in the setting of BKA, uremia, deconditioning. Stable s/p fracture fixation on 11/22.   -Appreciate ongoing Ortho recs  -Dilaudid 0.5-1 mg Q4h PRN -Oxycodone 5-10 q4 PRN  -Oxycodone 10-15 q4 PRN -Continue calcitriol 0.20mcg BID -PT/OT eval and treat -Fall precautions

## 2023-10-21 NOTE — Progress Notes (Signed)
Daily Progress Note Intern Pager: [phone]  Patient name: [name] [name] Medical record number: [id number] Date of birth: 1971/10/13 Age: 52 y.o. Gender: male  Primary Care Provider: [name], MD Consultants: Orthopedic Surgery, Nephrology, Interventional Radiology  Code Status: FULL  Pt Overview and Major Events to Date:  11/20 - Admitted to FMTS 11/22 - Surgical fixation of fracture, TDC placement per IR 11/23 - First HD session 11/26 - AV fistula placement   Assessment and Plan: [name] [name] is a 52 y.o. male with a pertinent PMH of frequent falls, CKD stage IV, MDD, R BKA, T2DM, and PAD who presented with R intertrochanteric proximal femur fracture and was admitted for fixation of fracture and falls workup. Now s/p fracture fixation on 11/22 and TDC placement on 11/22, as well as AV fistula placement 11/26.  Patient is awaiting SNF placement with HD setup. Assessment & Plan Closed intertrochanteric fracture of right hip (HCC) Secondary to mechanical fall.  Frequent falls at home thought to be in the setting of BKA, uremia, deconditioning. Stable s/p fracture fixation on 11/22.   -Appreciate ongoing Ortho recs  -Dilaudid 0.5-1 mg Q4h PRN -Oxycodone 5-10 q4 PRN  -Oxycodone 10-15 q4 PRN -Continue calcitriol 0.47mcg BID -PT/OT eval and treat -Fall precautions Acute kidney injury superimposed on CKD (HCC) Creatinine at known peak, good urine output yesterday.  Received AV fistula 11/27. Working on establishing outpatient dialysis.  -Appreciate ongoing Nephro recs -Holding Farxiga for now -Avoid nephrotoxic agents -Strict I&Os -Continue calcitriol and Renvela -RFP daily Anemia Stable, Hemoglobin 8.0 this morning.  Acute on chronic anemia during this admission suspected to be in the setting of anemia of chronic disease vs acute blood loss in the setting of fracture vs EPO deficiency in setting of progressed CKD.  -Transfusion threshold <7 -Iron infusion  on HD days x 5 doses -Appreciate ongoing Nephro recs Controlled diabetes mellitus type 2 with complications (HCC) A1c 6.1%.  Has insulin pump at home where he gets around 12 units/day, but it is removed currently.  CBGs have continued to decrease, last two were in the 100s. -Very sensitive SSI -CBGs ACHS Urinary retention Foley catheter now placed.  1.7L OP in last 24h.  -Strict I&Os -Foley care per nursing  Chronic and Stable Problems: Hypertension: Continue home carvedilol 12.5 mg BID, diltiazem 300 mg daily, hydralazine 100 mg BID HFpEF: Holding Lasix Nicotine dependence: Half pack a day, declines nicotine patch PAD: S/p right BKA, encourage nicotine cessation Neuropathy: Continue gabapentin 300mg  daily Hyperlipidemia: Continue atorvastatin 400mg  daily MDD: Continue fluoxetine 10 mg daily  FEN/GI: Renal with fluid restriction PPx: Aspirin, heparin with dialysis Dispo: SNF pending placement  Subjective:  No changes overnight.  No new concerns today.  Awaiting SNF.  Objective: Temp:  [98 F (36.7 C)-99 F (37.2 C)] 98 F (36.7 C) (11/27 1920) Pulse Rate:  [64-110] 64 (11/27 1920) Resp:  [8-20] 18 (11/27 1920) BP: (123-183)/(71-93) 143/80 (11/27 2144) SpO2:  [93 %-100 %] 97 % (11/27 1920) Weight:  [107.3 kg-107.5 kg] 107.5 kg (11/27 1240)  Physical Exam: General: Age-appropriate, resting comfortably in bed, quiet, NAD, alert and at baseline. Cardiovascular: Regular rate and rhythm. Normal S1/S2. No murmurs, rubs, or gallops appreciated. 2+ radial pulses. Pulmonary: Clear bilaterally to ascultation. No increased WOB, no accessory muscle usage on room air. No wheezes, rales, or crackles. Extremities: R BKA.  LLE warm and perfusing.  2+ thrill over AV fistula.  TDC intact and clean.  Laboratory: Most recent CBC Lab Results  Component Value Date   WBC 12.7 (H) 10/20/2023   HGB 8.0 (L) 10/20/2023   HCT 25.8 (L) 10/20/2023   MCV 90.5 10/20/2023   PLT 152 10/20/2023    Most recent BMP    Latest Ref Rng & Units 10/20/2023    6:07 AM  BMP  Glucose 70 - 99 mg/dL 161   BUN 6 - 20 mg/dL 69   Creatinine 0.96 - 1.24 mg/dL 0.45   Sodium 409 - 811 mmol/L 135   Potassium 3.5 - 5.1 mmol/L 4.7   Chloride 98 - 111 mmol/L 99   CO2 22 - 32 mmol/L 22   Calcium 8.9 - 10.3 mg/dL 7.8     Other pertinent labs: -None  New Imaging/Diagnostic Tests: -None  Lateia Fraser, MD 10/21/2023, 2:38 AM  PGY-1, [facility] FPTS Intern pager: [phone], text pages welcome Secure chat group [facility] Teaching Service

## 2023-10-21 NOTE — Assessment & Plan Note (Signed)
>>  ASSESSMENT AND PLAN FOR T2DM (TYPE 2 DIABETES MELLITUS) (HCC) WRITTEN ON 10/21/2023  2:59 AM BY SHITAREV, DIMITRY, MD  A1c 6.1%.  Has insulin  pump at home where he gets around 12 units/day, but it is removed currently.  CBGs have continued to decrease, last two were in the 100s. -Very sensitive SSI -CBGs ACHS

## 2023-10-21 NOTE — Progress Notes (Signed)
[facility] NEPHROLOGY PROGRESS NOTE  Assessment/ Plan: Pt is a 52 y.o. yo male with a history of DM 2, HTN, urinary retention, PAD (status post right BKA), HLD CKD5, presented after a fall and hip fracture.  # Fall/right intertrochanteric femur fracture status post IM nail/operative repair on 11/22.  Further management per orthopedic surgery.  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  Agreed to start dialysis.  Status post right IJ TDC by IR on 11/22.  First HD on 11/23.  He had left arm brachiobasilic AVF creation on 11/26 with Dr. Hetty Blend - No HD today (off schedule due to holiday).  Next HD treatment on 11/30 per TTS schedule    - I have reached out to HD SW to initiate CLIP process.  We are waiting on the choice of SNF to be able to locate an appropriate dialysis unit - Increased fluid restriction to 1.8 liters - nonoliguric   # Anemia of CKD and iron deficiency.  Iron saturation of 8%.   - IV iron x 5 doses - on aranesp 100 mcg every Friday.  He was previously on ESA outpatient.  I have increased aranesp to 150 mcg every Friday while inpatient for now    # HTN/volume: optimize volume with HD.   - Note that with regard to discharge meds, we have stopped amlodipine as he is on concurrent diltiazem and should not be on both    # CKD-MBD/hyperphosphatemia: Increased sevelamer, intact PTH is 93.  Reduce calcitriol to 0.25 mcg with HD   Disposition - from a strictly renal standpoint continue inpatient monitoring; we are starting HD and awaiting an outpatient HD unit (need to know what SNF he will go to so that we can get the HD unit arranged)   Subjective:  He had 3.0 liters UOP over 11/27.  Yesterday he had reported leg swelling.  Still feels legs is swollen - trouble getting the prosthesis on .     Review of systems:    Denies n/v A little dizziness - better  Denies shortness of breath or chest pain  Still making quite a bit of urine      Objective Vital signs in last 24 hours: Vitals:   10/20/23 1920 10/20/23 2144 10/21/23 0406 10/21/23 0720  BP: (!) 143/80 (!) 143/80 (!) 141/72 (!) 152/82  Pulse: 64  71 72  Resp: 18  18 18   Temp: 98 F (36.7 C)  97.8 F (36.6 C) 98.5 F (36.9 C)  TempSrc: Oral  Oral Oral  SpO2: 97%  97% 97%  Weight:      Height:       Weight change: 0.451 kg  Intake/Output Summary (Last 24 hours) at 10/21/2023 1145 Last data filed at 10/21/2023 0437 Gross per 24 hour  Intake 390 ml  Output 2975 ml  Net -2585 ml      Labs: RENAL PANEL Recent Labs  Lab 10/17/23 0621 10/18/23 0645 10/19/23 0602 10/20/23 0607 10/21/23 0522  NA 135 134* 137 135 133*  K 5.1 4.7 4.3 4.7 4.3  CL 103 101 101 99 100  CO2 19* 23 23 22 26   GLUCOSE 138* 116* 108* 205* 140*  BUN 76* 59* 46* 69* 44*  CREATININE 9.56* 7.90* 6.26* 7.97* 5.83*  CALCIUM 7.9* 7.8* 8.0* 7.8* 7.7*  PHOS 7.9* 6.8* 6.1* 7.3* 5.7*  ALBUMIN 2.2* 2.2* 2.2* 2.1* 2.2*    Liver Function Tests: Recent Labs  Lab 10/19/23 0602 10/20/23 1610 10/21/23 0522  ALBUMIN 2.2* 2.1* 2.2*   No results for input(s): "LIPASE", "AMYLASE" in the last 168 hours.  No results for input(s): "AMMONIA" in the last 168 hours. CBC: Recent Labs    07/01/23 1223 07/01/23 1228 07/29/23 1155 07/29/23 1203 08/26/23 1253 08/26/23 1300 10/07/23 1217 10/07/23 1219 10/14/23 0325 10/14/23 1348 10/16/23 1936 10/17/23 0621 10/18/23 0645 10/19/23 0602 10/20/23 0607  HGB  --    < >  --    < >  --    < >  --    < > 6.3*   < > 7.4* 8.2* 8.3* 8.2* 8.0*  MCV  --   --   --   --   --   --   --    < > 92.6   < >  --  90.7 90.3 89.7 90.5  FERRITIN 14*  --  17*  --  24  --  50  --  39  --   --   --   --   --   --   TIBC 349  --  354  --  305  --  260  --  225*  --   --   --   --   --   --   IRON 76  --  91  --  126  --  79  --  19*  --   --   --   --   --   --    < > = values in this interval not displayed.    Cardiac Enzymes: No results for  input(s): "CKTOTAL", "CKMB", "CKMBINDEX", "TROPONINI" in the last 168 hours. CBG: Recent Labs  Lab 10/20/23 1352 10/20/23 1704 10/20/23 2026 10/21/23 0716 10/21/23 1125  GLUCAP 130* 182* 146* 122* 218*    Iron Studies:  No results for input(s): "IRON", "TIBC", "TRANSFERRIN", "FERRITIN" in the last 72 hours.  Studies/Results: No results found.  Medications: Infusions:  iron sucrose Stopped (10/20/23 1400)    Scheduled Medications:  acetaminophen  650 mg Oral Q6H   aspirin  325 mg Oral Daily   atorvastatin  40 mg Oral QHS   baclofen  10 mg Oral Daily   calcitRIOL  0.5 mcg Oral Daily   carvedilol  12.5 mg Oral BID WC   Chlorhexidine Gluconate Cloth  6 each Topical Q0600   Chlorhexidine Gluconate Cloth  6 each Topical Q0600   [START ON 10/22/2023] darbepoetin (ARANESP) injection - DIALYSIS  150 mcg Subcutaneous Q Fri-1800   diltiazem  300 mg Oral Daily   [START ON 10/22/2023] FLUoxetine  20 mg Oral Daily   gabapentin  300 mg Oral Daily   hydrALAZINE  100 mg Oral BID   influenza vac split trivalent PF  0.5 mL Intramuscular Tomorrow-1000   insulin aspart  0-6 Units Subcutaneous TID WC   polyethylene glycol  17 g Oral BID   senna  1 tablet Oral BID   sevelamer carbonate  1,600 mg Oral TID WC   sodium chloride flush  3 mL Intravenous Q12H   tamsulosin  0.4 mg Oral QPC supper   Vitamin D (Ergocalciferol)  50,000 Units Oral Q7 days    have reviewed scheduled and prn medications.  Physical Exam:      General adult male in bed in no acute distress HEENT normocephalic atraumatic extraocular movements intact sclera anicteric Neck supple trachea midline Lungs clear to auscultation bilaterally normal work of breathing at rest on room air Heart S1S2 no rub Abdomen soft  nontender nondistended Extremities right BKA; no edema  Psych blunted affect  Neuro alert and oriented x 3 provides hx and follows commands Access RIJ tunn catheter; LUE AVF with bruit and thrill    [name] [name] 10/21/2023,12:01 PM  LOS: 8 days

## 2023-10-21 NOTE — Assessment & Plan Note (Signed)
Foley catheter now placed.  1.7L OP in last 24h.  -Strict I&Os -Foley care per nursing

## 2023-10-22 ENCOUNTER — Inpatient Hospital Stay (HOSPITAL_COMMUNITY): Payer: 59

## 2023-10-22 DIAGNOSIS — S72001A Fracture of unspecified part of neck of right femur, initial encounter for closed fracture: Secondary | ICD-10-CM | POA: Diagnosis not present

## 2023-10-22 DIAGNOSIS — Z789 Other specified health status: Secondary | ICD-10-CM | POA: Insufficient documentation

## 2023-10-22 DIAGNOSIS — N189 Chronic kidney disease, unspecified: Secondary | ICD-10-CM | POA: Diagnosis not present

## 2023-10-22 DIAGNOSIS — N179 Acute kidney failure, unspecified: Secondary | ICD-10-CM | POA: Diagnosis not present

## 2023-10-22 LAB — CBC
HCT: 28.3 % — ABNORMAL LOW (ref 39.0–52.0)
Hemoglobin: 8.5 g/dL — ABNORMAL LOW (ref 13.0–17.0)
MCH: 28.1 pg (ref 26.0–34.0)
MCHC: 30 g/dL (ref 30.0–36.0)
MCV: 93.4 fL (ref 80.0–100.0)
Platelets: 181 10*3/uL (ref 150–400)
RBC: 3.03 MIL/uL — ABNORMAL LOW (ref 4.22–5.81)
RDW: 15.9 % — ABNORMAL HIGH (ref 11.5–15.5)
WBC: 9.3 10*3/uL (ref 4.0–10.5)
nRBC: 0 % (ref 0.0–0.2)

## 2023-10-22 LAB — RENAL FUNCTION PANEL
Albumin: 2.2 g/dL — ABNORMAL LOW (ref 3.5–5.0)
Anion gap: 11 (ref 5–15)
BUN: 62 mg/dL — ABNORMAL HIGH (ref 6–20)
CO2: 27 mmol/L (ref 22–32)
Calcium: 7.9 mg/dL — ABNORMAL LOW (ref 8.9–10.3)
Chloride: 97 mmol/L — ABNORMAL LOW (ref 98–111)
Creatinine, Ser: 7.73 mg/dL — ABNORMAL HIGH (ref 0.61–1.24)
GFR, Estimated: 8 mL/min — ABNORMAL LOW (ref >60)
Glucose, Bld: 168 mg/dL — ABNORMAL HIGH (ref 70–99)
Phosphorus: 7 mg/dL — ABNORMAL HIGH (ref 2.5–4.6)
Potassium: 5.6 mmol/L — ABNORMAL HIGH (ref 3.5–5.1)
Sodium: 135 mmol/L (ref 135–145)

## 2023-10-22 LAB — BASIC METABOLIC PANEL
Anion gap: 13 (ref 5–15)
BUN: 68 mg/dL — ABNORMAL HIGH (ref 6–20)
CO2: 24 mmol/L (ref 22–32)
Calcium: 8.1 mg/dL — ABNORMAL LOW (ref 8.9–10.3)
Chloride: 99 mmol/L (ref 98–111)
Creatinine, Ser: 7.82 mg/dL — ABNORMAL HIGH (ref 0.61–1.24)
GFR, Estimated: 8 mL/min — ABNORMAL LOW (ref >60)
Glucose, Bld: 156 mg/dL — ABNORMAL HIGH (ref 70–99)
Potassium: 5.1 mmol/L (ref 3.5–5.1)
Sodium: 136 mmol/L (ref 135–145)

## 2023-10-22 LAB — GLUCOSE, CAPILLARY
Glucose-Capillary: 221 mg/dL — ABNORMAL HIGH (ref 70–99)
Glucose-Capillary: 330 mg/dL — ABNORMAL HIGH (ref 70–99)
Glucose-Capillary: 131 mg/dL — ABNORMAL HIGH (ref 70–99)
Glucose-Capillary: 188 mg/dL — ABNORMAL HIGH (ref 70–99)

## 2023-10-22 MED ORDER — CHLORHEXIDINE GLUCONATE CLOTH 2 % EX PADS: 6.0000 | MEDICATED_PAD | Freq: Every day | CUTANEOUS | Status: DC

## 2023-10-22 MED ORDER — SODIUM CHLORIDE 0.9 % IV SOLN
100.0000 mg | INTRAVENOUS | Status: DC
Start: 2023-10-23 — End: 2023-10-28
  Administered 2023-10-23: 100 mg via INTRAVENOUS
  Filled 2023-10-22 (×2): qty 5

## 2023-10-22 MED ORDER — LACTULOSE 10 GM/15ML PO SOLN: 20.0000 g | Freq: Two times a day (BID) | ORAL | Status: DC

## 2023-10-22 MED ORDER — LACTULOSE 10 GM/15ML PO SOLN
10.0000 g | Freq: Once | ORAL | Status: AC
  Administered 2023-10-22: 10 g via ORAL
  Filled 2023-10-22: qty 15

## 2023-10-22 MED ORDER — SODIUM ZIRCONIUM CYCLOSILICATE 10 G PO PACK
10.0000 g | PACK | Freq: Once | ORAL | Status: AC
  Administered 2023-10-22: 10 g via ORAL
  Filled 2023-10-22: qty 1

## 2023-10-22 NOTE — Progress Notes (Signed)
Patient has been unable to void since foley removed, bladder scan showed . MD  notified and orders given to replace foley. Patient stated he had strong urge to void and wanted to hold off and see if he could go on his own.    Erica Osuna, Kae Heller, RN

## 2023-10-22 NOTE — Progress Notes (Signed)
[facility] NEPHROLOGY PROGRESS NOTE  Assessment/ Plan: Pt is a 52 y.o. yo male with a history of DM 2, HTN, urinary retention, PAD (status post right BKA), HLD CKD5, presented after a fall and hip fracture.  # Fall/right intertrochanteric femur fracture status post IM nail/operative repair on 11/22.  Further management per orthopedic surgery.  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  Agreed to start dialysis.  Status post right IJ TDC by IR on 11/22.  First HD on 11/23.  He had left arm brachiobasilic AVF creation on 11/26 with Dr. Hetty Blend -  Next HD treatment on 11/30 per TTS schedule    - I have reached out to HD SW to initiate CLIP process.  We are waiting on the choice of SNF to be able to locate an appropriate dialysis unit - Increased fluid restriction to 1.8 liters - nonoliguric   # Anemia of CKD and iron deficiency.  Iron saturation of 8%.   - IV iron x 5 doses - on aranesp 100 mcg every Friday.  He was previously on ESA outpatient.  I have increased aranesp to 150 mcg every Friday while inpatient for now    # HTN/volume: optimize volume with HD.   - Note that with regard to discharge meds, we have stopped amlodipine as he is on concurrent diltiazem and should not be on both  -  also currently on coreg and hydral-  will wean BP meds as able with better volume status   # CKD-MBD/hyperphosphatemia: Increased sevelamer, intact PTH is 93.  Reduce calcitriol to 0.25 mcg with HD    Subjective:   UOP not recorded.  BUN and crt both rising off of HD - no new complaints   Objective Vital signs in last 24 hours: Vitals:   10/21/23 1449 10/21/23 1944 10/22/23 0425 10/22/23 0800  BP: (!) 147/72 132/67 (!) 150/75 (!) 156/81  Pulse: 65 65 65 75  Resp: 16 18 16 18   Temp: 98.5 F (36.9 C) 98.6 F (37 C) 98.3 F (36.8 C) 97.6 F (36.4 C)  TempSrc: Oral Oral Oral Oral  SpO2: 94% 97% 96% 97%  Weight:      Height:       Weight change:  No intake  or output data in the 24 hours ending 10/22/23 0951     Labs: RENAL PANEL Recent Labs  Lab 10/18/23 0645 10/19/23 0602 10/20/23 0607 10/21/23 0522 10/22/23 0608  NA 134* 137 135 133* 135  K 4.7 4.3 4.7 4.3 5.6*  CL 101 101 99 100 97*  CO2 23 23 22 26 27   GLUCOSE 116* 108* 205* 140* 168*  BUN 59* 46* 69* 44* 62*  CREATININE 7.90* 6.26* 7.97* 5.83* 7.73*  CALCIUM 7.8* 8.0* 7.8* 7.7* 7.9*  PHOS 6.8* 6.1* 7.3* 5.7* 7.0*  ALBUMIN 2.2* 2.2* 2.1* 2.2* 2.2*    Liver Function Tests: Recent Labs  Lab 10/20/23 0607 10/21/23 0522 10/22/23 0608  ALBUMIN 2.1* 2.2* 2.2*   No results for input(s): "LIPASE", "AMYLASE" in the last 168 hours.  No results for input(s): "AMMONIA" in the last 168 hours. CBC: Recent Labs    07/01/23 1223 07/01/23 1228 07/29/23 1155 07/29/23 1203 08/26/23 1253 08/26/23 1300 10/07/23 1217 10/07/23 1219 10/14/23 0325 10/14/23 1348 10/16/23 1936 10/17/23 0621 10/18/23 0645 10/19/23 0602 10/20/23 0607  HGB  --    < >  --    < >  --    < >  --    < >  6.3*   < > 7.4* 8.2* 8.3* 8.2* 8.0*  MCV  --   --   --   --   --   --   --    < > 92.6   < >  --  90.7 90.3 89.7 90.5  FERRITIN 14*  --  17*  --  24  --  50  --  39  --   --   --   --   --   --   TIBC 349  --  354  --  305  --  260  --  225*  --   --   --   --   --   --   IRON 76  --  91  --  126  --  79  --  19*  --   --   --   --   --   --    < > = values in this interval not displayed.    Cardiac Enzymes: No results for input(s): "CKTOTAL", "CKMB", "CKMBINDEX", "TROPONINI" in the last 168 hours. CBG: Recent Labs  Lab 10/21/23 0716 10/21/23 1125 10/21/23 1614 10/21/23 2144 10/22/23 0800  GLUCAP 122* 218* 131* 241* 221*    Iron Studies:  No results for input(s): "IRON", "TIBC", "TRANSFERRIN", "FERRITIN" in the last 72 hours.  Studies/Results: No results found.  Medications: Infusions:  [START ON 10/23/2023] iron sucrose      Scheduled Medications:  acetaminophen  650 mg Oral Q6H    aspirin  325 mg Oral Daily   atorvastatin  40 mg Oral QHS   baclofen  10 mg Oral Daily   [START ON 10/23/2023] calcitRIOL  0.25 mcg Oral Q T,Th,Sa-HD   carvedilol  12.5 mg Oral BID WC   Chlorhexidine Gluconate Cloth  6 each Topical Q0600   Chlorhexidine Gluconate Cloth  6 each Topical Q0600   darbepoetin (ARANESP) injection - DIALYSIS  150 mcg Subcutaneous Q Fri-1800   diltiazem  300 mg Oral Daily   FLUoxetine  20 mg Oral Daily   gabapentin  300 mg Oral Daily   hydrALAZINE  100 mg Oral BID   influenza vac split trivalent PF  0.5 mL Intramuscular Tomorrow-1000   insulin aspart  0-6 Units Subcutaneous TID WC   polyethylene glycol  17 g Oral BID   senna  1 tablet Oral BID   sevelamer carbonate  1,600 mg Oral TID WC   sodium chloride flush  3 mL Intravenous Q12H   tamsulosin  0.4 mg Oral QPC supper   Vitamin D (Ergocalciferol)  50,000 Units Oral Q7 days    have reviewed scheduled and prn medications.  Physical Exam:      General adult male in bed in no acute distress HEENT normocephalic atraumatic extraocular movements intact sclera anicteric Neck supple trachea midline Lungs clear to auscultation bilaterally normal work of breathing at rest on room air Heart S1S2 no rub Abdomen soft nontender nondistended Extremities right BKA; trace to 1+  edema  Psych blunted affect  Neuro alert and oriented x 3 provides hx and follows commands Access RIJ tunn catheter; LUE AVF with bruit and thrill    Evora Schechter A Chandra Asher 10/22/2023,9:51 AM  LOS: 9 days

## 2023-10-22 NOTE — Progress Notes (Signed)
Patient c/o ice pack placed by PT making right knee worst, ice removed MD notified. MD stated she was coming to floor to round on patient   Trimaine Maser, Kae Heller, RN

## 2023-10-22 NOTE — TOC Progression Note (Addendum)
Transition of Care [facility]) - Progression Note    Patient Details  Name: [name] [name] MRN: [id number] Date of Birth: 05/20/1971  [facility] CM/SW Contact  Erin Sons, [place] Phone Number: 10/22/2023, 2:44 PM  Clinical Narrative:     CSW met with pt and provided SNF bed offers. Pt to review offers; TOC will follow for SNF choice.   1530: Called pt on room phone to get SNF choice. Pt chooses [facility]. CSW awaiting confirmation from [facility]. Went ahead and initiated SNF auth request. If [facility] cannot take pt, facility will need to be changed on auth.   Expected Discharge Plan: Skilled Nursing Facility Barriers to Discharge: Continued Medical Work up  Expected Discharge Plan and Services   Discharge Planning Services: CM Consult Post Acute Care Choice: Skilled Nursing Facility Living arrangements for the past 2 months: Single Family Home                                       Social Determinants of Health (SDOH) Interventions SDOH Screenings   Food Insecurity: No Food Insecurity (10/14/2023)  Housing: Medium Risk (10/14/2023)  Transportation Needs: No Transportation Needs (10/14/2023)  Utilities: Not At Risk (10/14/2023)  Alcohol Screen: Low Risk  (06/08/2023)  Depression (PHQ2-9): Medium Risk (10/12/2023)  Financial Resource Strain: High Risk (08/26/2023)  Physical Activity: Inactive (06/08/2023)  Social Connections: Socially Isolated (06/08/2023)  Stress: Stress Concern Present (08/26/2023)  Tobacco Use: High Risk (10/19/2023)  Health Literacy: Inadequate Health Literacy (06/08/2023)    Readmission Risk Interventions     No data to display

## 2023-10-22 NOTE — Assessment & Plan Note (Addendum)
Acute on chronic anemia during this admission suspected to be in the setting of anemia of chronic disease vs acute blood loss in the setting of fracture vs EPO deficiency in setting of progressed CKD. -Transfusion threshold <7 -Iron infusion on HD days x5 doses -Appreciate ongoing Nephro recs

## 2023-10-22 NOTE — Plan of Care (Signed)
  Problem: Education: Goal: Ability to describe self-care measures that may prevent or decrease complications (Diabetes Survival Skills Education) will improve Outcome: Progressing   

## 2023-10-22 NOTE — Plan of Care (Addendum)
FMTS Interim Progress Note  S: Patient seen at bedside for reported knee pain. Per patient, R knee started hurting today after someone iced it. The pain comes and goes, and is throbbing in nature. Working with PT may have worsened the pain. Does recall hitting it during fall prior to admission.   O: BP 137/73 (BP Location: Right Arm)   Pulse 65   Temp 98 F (36.7 C) (Oral)   Resp 18   Ht 6\' 5"  (1.956 m)   Wt 107.5 kg   SpO2 97%   BMI 28.10 kg/m   General: Well-appearing. Resting comfortably in room, eating dinner. No acute distress.  CV: Normal S1/S2. No extra heart sounds. Warm and well-perfused. Pulm: Breathing comfortably on room air. No increased WOB. Ext: No increased warmth, erythema, or edema of R knee. Nontender to palpation of R patellar and quadriceps tendons. Some tenderness to palpation of medial and lateral patella. No crepitus. Normal ROM of R knee, though elicits pain.  Skin:  Warm, dry.  A/P: R knee pain  Patient vitals and exam reassuring against acute infection. Suspect discomfort secondary to inflammation/irritation from PT exercises.  - R knee xray to rule out bony changes - Kpad added for therapeutic comfort - Consider lidocaine patch if pain continues  - Continue tylenol 650 q6 scheduled  - Consider imaging as needed for worsening symptoms  Continue treatment plan as otherwise noted in hospital team progress note.   Ivery Quale, MD 10/22/2023, 6:25 PM PGY-1, [facility] Family Medicine Service pager [phone]

## 2023-10-22 NOTE — Progress Notes (Signed)
Daily Progress Note Intern Pager: [phone]  Patient name: [name] [name] Medical record number: [id number] Date of birth: 10/11/1971 Age: 52 y.o. Gender: male  Primary Care Provider: [name], MD Consultants: Orthopedic Surgery, Nephrology, Interventional Radiology  Code Status: FULL  Pt Overview and Major Events to Date:  11/20 - Admitted to FMTS 11/01/23 - Surgical fixation of fracture, TDC placement per IR 11/23 - First HD session 11/26 - AV fistula placement  Assessment and Plan: [name] [name] is a 52 y.o. male with a pertinent PMH of frequent falls, CKD stage IV, MDD, R BKA, T2DM, and PAD who presented with R intertrochanteric proximal femur fracture and was admitted for fixation of fracture and falls workup. Now s/p fracture fixation on 2023-11-01 and TDC placement on 11/01/2023, as well as AV fistula placement 11/26.  Patient with last BM >7 days ago, will use caution in this new HD patient, but will dose lactulose 10 g at a time until BM occurs.  Awaiting arrangement of outpatient HD unit once SNF is known, also still awaiting SNF placement. Assessment & Plan Closed intertrochanteric fracture of right hip (HCC) Secondary to mechanical fall.  Frequent falls at home thought to be in the setting of BKA, uremia, deconditioning. Stable s/p fracture fixation on Nov 01, 2023.  Still no BM on current bowel regimen and will cautiously add lactulose today given large amount of opiates. -Appreciate ongoing Ortho recs -Pain regimen: Dilaudid 0.5-1 mg Q4h PRN, oxycodone 5-10 Q4h PRN, oxycodone 10-15 Q4h PRN -Bowel regimen: Miralax BID, senna BID; add lactulose 10 mg once, redose 10 mg at a time if no BM -Continue calcitriol 0.70mcg BID -PT/OT continue to treat, recommending SNF -Fall precautions Acute kidney injury superimposed on CKD (HCC) On HD schedule TTS, holiday 11/28 and next session 11/30.  Creatinine now downtrending from peak, UOP not documented past 24 hours.  Received AV  fistula 11/27. Working on establishing outpatient HD.  Lokelma for K 5.6. -Appreciate ongoing Nephrology recs -Holding [place] for now -Avoid nephrotoxic agents -Strict I&Os -Continue calcitriol and Renvela per Nephro -RFP daily Anemia Acute on chronic anemia during this admission suspected to be in the setting of anemia of chronic disease vs acute blood loss in the setting of fracture vs EPO deficiency in setting of progressed CKD. -Transfusion threshold <7 -Iron infusion on HD days x5 doses -Appreciate ongoing Nephro recs Controlled diabetes mellitus type 2 with complications (HCC) A1c 6.1%.  Has insulin pump at home where he gets around 12 units/day, but it is removed currently.  CBGs ranging 140-240, 2 units SAI in past 24 hours. -Very sensitive SSI -CBGs ACHS Urinary retention Foley catheter in place.  UOP not documented past 24 hours, but 400 mL noted while on rounds. -Strict I&Os -Remove Foley for void trial today, replace if I&O x3 and still retaining on Q6h bladder scan Nonimmune to hepatitis B virus Hepatitis B surface antibodies below immunity threshold 2023/11/01 and is at high risk of infection on HD. -Hepatitis B vaccine administered 11/28, will need a second shot in series in at least 1 month  Chronic and Stable Problems: Hypertension: Continue home carvedilol 12.5 mg BID, diltiazem 300 mg daily, hydralazine 100 mg BID HFpEF: Holding Lasix Nicotine dependence: Half pack a day, declines nicotine patch PAD: s/p right BKA, encourage nicotine cessation Neuropathy: Continue gabapentin 300mg  daily Hyperlipidemia: Continue atorvastatin 400mg  daily MDD: Continue fluoxetine 10 mg daily  FEN/GI: Renal with fluid restriction PPx: Aspirin, heparin with dialysis Dispo: SNF pending placement  Subjective:  This morning, patient states that he is "fine."  States that he is ready to go to SNF once placement is ready.  Notes that his last bowel movement was over 7 days ago.  Agrees to  have Foley removed for void trial.  No events overnight.  Objective: Temp:  [97.8 F (36.6 C)-98.6 F (37 C)] 98.6 F (37 C) (11/28 1944) Pulse Rate:  [65-72] 65 (11/28 1944) Resp:  [16-18] 18 (11/28 1944) BP: (132-152)/(67-82) 132/67 (11/28 1944) SpO2:  [94 %-97 %] 97 % (11/28 1944)  Physical Exam: General: Adult male, resting in bed, quiet.  NAD. Cardiovascular: Regular rate and rhythm. Normal S1/S2. No murmurs, rubs, or gallops appreciated. 2+ radial pulses. Abdominal: Normoactive bowel sounds, nondistended. No tenderness to deep or light palpation. No rebound or guarding. Skin: Warm and dry. Extremities: R BKA.  LLE warm and perfusing.  TDC in place on R side, intact and clean.  AV fistula with palpable thrill.  Laboratory: Most recent CBC Lab Results  Component Value Date   WBC 12.7 (H) 10/20/2023   HGB 8.0 (L) 10/20/2023   HCT 25.8 (L) 10/20/2023   MCV 90.5 10/20/2023   PLT 152 10/20/2023   Most recent BMP    Latest Ref Rng & Units 10/21/2023    5:22 AM  BMP  Glucose 70 - 99 mg/dL 536   BUN 6 - 20 mg/dL 44   Creatinine 6.44 - 1.24 mg/dL 0.34   Sodium 742 - 595 mmol/L 133   Potassium 3.5 - 5.1 mmol/L 4.3   Chloride 98 - 111 mmol/L 100   CO2 22 - 32 mmol/L 26   Calcium 8.9 - 10.3 mg/dL 7.7     Other pertinent labs: -None  New Imaging/Diagnostic Tests: -None  Kathey Simer, MD 10/22/2023, 3:35 AM  PGY-1, [facility] FPTS Intern pager: [phone], text pages welcome Secure chat group [facility] Teaching Service

## 2023-10-22 NOTE — Plan of Care (Signed)
  Problem: Metabolic: Goal: Ability to maintain appropriate glucose levels will improve Outcome: Progressing   Problem: Nutritional: Goal: Maintenance of adequate nutrition will improve Outcome: Progressing   Problem: Skin Integrity: Goal: Risk for impaired skin integrity will decrease Outcome: Progressing   

## 2023-10-22 NOTE — Assessment & Plan Note (Signed)
>>  ASSESSMENT AND PLAN FOR T2DM (TYPE 2 DIABETES MELLITUS) (HCC) WRITTEN ON 10/22/2023  4:26 AM BY SHITAREV, DIMITRY, MD  A1c 6.1%.  Has insulin  pump at home where he gets around 12 units/day, but it is removed currently.  CBGs ranging 140-240, 2 units SAI in past 24 hours. -Very sensitive SSI -CBGs ACHS

## 2023-10-22 NOTE — Assessment & Plan Note (Addendum)
On HD schedule TTS, holiday 11/28 and next session 11/30.  Creatinine now downtrending from peak, UOP not documented past 24 hours.  Received AV fistula 11/27. Working on establishing outpatient HD.  Lokelma for K 5.6. -Appreciate ongoing Nephrology recs -Holding [place] for now -Avoid nephrotoxic agents -Strict I&Os -Continue calcitriol and Renvela per Nephro -RFP daily

## 2023-10-22 NOTE — Progress Notes (Signed)
Physical Therapy Treatment Patient Details Name: [name] [name] MRN: [id number] DOB: Aug 22, 1971 Today's Date: 10/22/2023   History of Present Illness Pt is a 52 y/o male admitted on 10/13/23 after presenting with R intertrochanteric fracture from a fall outside of a gas station. Pt is s/p RLE fixation on 10/15/23.  Symptomatic orthostatic hypotension during PT sessions 11/27, 11/29. PMH: frequent falls, CKD IV, MDD, R BKA, DM2, PAD.    PT Comments  Pt received in supine, agreeable to therapy session, with good participation in transfer training and seated activity. Pt standing tolerance limited due to symptomatic orthostatic hypotension, RN/MD notified. Pt with fluid restriction but did encourage him to hydrate if needed with water already in room. Pt also c/o increased R suprapatellar pain after pivoting from bed to chair, RN/MD notified, ice applied. Pt needing up to minA from very elevated surface to perform transfers and step pivot to chair with RW, anticipate he will need increased +2 assist to stand from lower chair height and RN notified of alternate squat vs scoot pivot from drop arm chair if he is too tired/pain too high to stand when returning to bed. Pt will continue to benefit from skilled rehab in a post acute setting <3 hours/day to maximize functional gains before returning home.   Vital Signs  Patient Position (if appropriate) Orthostatic Vitals  Orthostatic Sitting  BP- Sitting 144/73 (feet on floor, EOB)  Pulse- Sitting 74  Orthostatic Standing at 0 minutes  BP- Standing at 0 minutes 115/64  Pulse- Standing at 0 minutes  (did not record)   Orthostatic Lying   BP- Lying 150/73 (reclined in chair)  Pulse- Lying 68     If plan is discharge home, recommend the following: A lot of help with walking and/or transfers;A lot of help with bathing/dressing/bathroom;Assist for transportation;Direct supervision/assist for financial management;Assistance with cooking/housework;Help  with stairs or ramp for entrance   Can travel by private vehicle     No  Equipment Recommendations  None recommended by PT    Recommendations for Other Services       Precautions / Restrictions Precautions Precautions: Fall Precaution Comments: RLE prosthesis (in room), chronic R BKA Restrictions Weight Bearing Restrictions: Yes RLE Weight Bearing: Weight bearing as tolerated     Mobility  Bed Mobility Overal bed mobility: Needs Assistance Bed Mobility: Supine to Sit, Sit to Supine     Supine to sit: Supervision, HOB elevated, Used rails Sit to supine: Supervision, Used rails, HOB elevated   General bed mobility comments: increased time and effort, to L EOB    Transfers Overall transfer level: Needs assistance Equipment used: Rolling walker (2 wheels) Transfers: Sit to/from Stand, Bed to chair/wheelchair/BSC Sit to Stand: Min assist, From elevated surface   Step pivot transfers: Min assist, From elevated surface       General transfer comment: very elevated bed<>RW and step pivot RW>recliner on second standing trial, pt c/o dizziness which increases as he stands longer for BP assessment, pt tolerates standing up to ~2 mins prior to needing seated breaks. Pt defers gait progression due to pain after ~4.5 ft amb from bed to chair.    Ambulation/Gait   Gait Distance (Feet): 4.5 Feet Assistive device: Rolling walker (2 wheels) Gait Pattern/deviations: Step-to pattern, Decreased stride length, Trunk flexed       General Gait Details: defer after pivot transfer, pain level too high, RN notified. Ice to his R hip/knee in chair   Stairs  Wheelchair Mobility     Tilt Bed    Modified Rankin (Stroke Patients Only)       Balance Overall balance assessment: History of Falls, Needs assistance Sitting-balance support: Feet supported, No upper extremity supported Sitting balance-Leahy Scale: Fair Sitting balance - Comments: able to perform self  care tasks seated on EOB without assistance for sitting balance   Standing balance support: Bilateral upper extremity supported, During functional activity, Reliant on assistive device for balance Standing balance-Leahy Scale: Poor Standing balance comment: RW and external assist for safety                            Cognition Arousal: Alert Behavior During Therapy: Flat affect Overall Cognitive Status: No family/caregiver present to determine baseline cognitive functioning                                 General Comments: limited conversation but able to make his needs known when prompted/questioned. Oriented to situation/location/self, not further assessed.        Exercises      General Comments General comments (skin integrity, edema, etc.): see BP in comments above; dizziness increases with standing; SpO2 and HR WFL on RA      Pertinent Vitals/Pain Pain Assessment Pain Assessment: Faces Faces Pain Scale: Hurts even more Pain Location: right hip and R knee more severe with WB Pain Descriptors / Indicators: Discomfort, Grimacing, Guarding Pain Intervention(s): Monitored during session, Repositioned    Home Living                          Prior Function            PT Goals (current goals can now be found in the care plan section) Acute Rehab PT Goals Patient Stated Goal: None stated PT Goal Formulation: With patient Time For Goal Achievement: 11/01/23 Progress towards PT goals: Progressing toward goals    Frequency    Min 1X/week      PT Plan      Co-evaluation              AM-PAC PT "6 Clicks" Mobility   Outcome Measure  Help needed turning from your back to your side while in a flat bed without using bedrails?: A Little Help needed moving from lying on your back to sitting on the side of a flat bed without using bedrails?: A Little Help needed moving to and from a bed to a chair (including a wheelchair)?: A  Lot Help needed standing up from a chair using your arms (e.g., wheelchair or bedside chair)?: A Lot Help needed to walk in hospital room?: Total (<72ft) Help needed climbing 3-5 steps with a railing? : Total 6 Click Score: 12    End of Session Equipment Utilized During Treatment: Gait belt;Other (comment) (RLE prosthesis donned during transfer, doffed once pt in chair so he can ice his R knee) Activity Tolerance: Patient tolerated treatment well;Patient limited by pain;Treatment limited secondary to medical complications (Comment);Other (comment) (symptomatic orthostatic hypotension) Patient left: in chair;with call bell/phone within reach;with chair alarm set Nurse Communication: Mobility status;Patient requests pain meds;Other (comment) (orthostatic hypotension; MD also notified) PT Visit Diagnosis: Muscle weakness (generalized) (M62.81);Unsteadiness on feet (R26.81);Other abnormalities of gait and mobility (R26.89);History of falling (Z91.81);Difficulty in walking, not elsewhere classified (R26.2)     Time: 1610-9604 PT Time Calculation (  min) (ACUTE ONLY): 23 min  Charges:    $Therapeutic Activity: 23-37 mins PT General Charges $$ ACUTE PT VISIT: 1 Visit                     Venezia Sargeant P., PTA Acute Rehabilitation Services Secure Chat Preferred 9a-5:30pm Office: [phone]    Dorathy Kinsman [facility] 10/22/2023, 3:38 PM

## 2023-10-22 NOTE — Progress Notes (Signed)
Mobility Specialist Progress Note:   10/22/23 1615  Mobility  Activity Transferred from chair to bed  Level of Assistance Maximum assist, patient does 25-49% (+2)  Assistive Device Stedy  RLE Weight Bearing WBAT  Activity Response Tolerated fair  $Mobility charge 1 Mobility  Mobility Specialist Start Time (ACUTE ONLY) 1615  Mobility Specialist Stop Time (ACUTE ONLY) 1629  Mobility Specialist Time Calculation (min) (ACUTE ONLY) 14 min   Pt requesting to return to bed. Required MaxA+2 to stand in stedy. Pt c/o of RLE pain. Back in bed with all needs met.   Addison Lank Mobility Specialist Please contact via SecureChat or  Rehab office at [phone]

## 2023-10-22 NOTE — Assessment & Plan Note (Addendum)
Secondary to mechanical fall.  Frequent falls at home thought to be in the setting of BKA, uremia, deconditioning. Stable s/p fracture fixation on 11/22.  Still no BM on current bowel regimen and will cautiously add lactulose today given large amount of opiates. -Appreciate ongoing Ortho recs -Pain regimen: Dilaudid 0.5-1 mg Q4h PRN, oxycodone 5-10 Q4h PRN, oxycodone 10-15 Q4h PRN -Bowel regimen: Miralax BID, senna BID; add lactulose 10 mg once, redose 10 mg at a time if no BM -Continue calcitriol 0.74mcg BID -PT/OT continue to treat, recommending SNF -Fall precautions

## 2023-10-22 NOTE — Progress Notes (Signed)
Patient hasn't been able to void, 14 fr foley placed patient tolerated well.  Rashema Seawright, Kae Heller, RN

## 2023-10-22 NOTE — Progress Notes (Signed)
Occupational Therapy Treatment Patient Details Name: [name] [name] MRN: [id number] DOB: 04/27/1971 Today's Date: 10/22/2023   History of present illness Pt is a 52 y/o male admitted on 10/13/23 after presenting with R intertrochanteric fracture from a fall outside of a gas station. Pt is s/p RLE fixation on 10/15/23.  PMH: frequent falls, CKD IV, MDD, R BKA, DM2, PAD   OT comments  Patient received in supine and agreeable to OT treatment with self care seated on EOB. Patient was setup and supervision for grooming and UB bathing/dressing seated on EOB and max assist for LB bathing. Patient declining transfer to recliner and returned to supine at end of session. Patient will benefit from continued inpatient follow up therapy, <3 hours/day to continue to address bathing, dressing, and functional transfers. Acute OT to continue to follow.       If plan is discharge home, recommend the following:  Two people to help with walking and/or transfers;Two people to help with bathing/dressing/bathroom;Assistance with cooking/housework;Assist for transportation;Help with stairs or ramp for entrance   Equipment Recommendations  Other (comment) (defer to next venue of care)    Recommendations for Other Services      Precautions / Restrictions Precautions Precautions: Fall Precaution Comments: RLE prosthesis (in room), chronic R BKA Restrictions Weight Bearing Restrictions: Yes RLE Weight Bearing: Weight bearing as tolerated       Mobility Bed Mobility Overal bed mobility: Needs Assistance Bed Mobility: Supine to Sit, Sit to Supine     Supine to sit: Supervision, HOB elevated, Used rails Sit to supine: Supervision, Used rails, HOB elevated   General bed mobility comments: increased time and effort    Transfers Overall transfer level: Needs assistance                 General transfer comment: declined transfer to recliner     Balance Overall balance assessment: History of  Falls, Needs assistance Sitting-balance support: Feet supported, No upper extremity supported Sitting balance-Leahy Scale: Fair Sitting balance - Comments: able to perform self care tasks seated on EOB without assistance for sitting balance                                   ADL either performed or assessed with clinical judgement   ADL Overall ADL's : Needs assistance/impaired     Grooming: Wash/dry hands;Wash/dry face;Oral care;Applying deodorant;Set up;Sitting Grooming Details (indicate cue type and reason): on EOB Upper Body Bathing: Set up;Sitting Upper Body Bathing Details (indicate cue type and reason): on EOB Lower Body Bathing: Maximal assistance;Sitting/lateral leans   Upper Body Dressing : Set up;Sitting Upper Body Dressing Details (indicate cue type and reason): changed gown                   General ADL Comments: self care performed seated on EOB, patient declining going to recliner    Extremity/Trunk Assessment              Vision       Perception     Praxis      Cognition Arousal: Alert Behavior During Therapy: Flat affect Overall Cognitive Status: No family/caregiver present to determine baseline cognitive functioning                                 General Comments: limited conversation  Exercises      Shoulder Instructions       General Comments      Pertinent Vitals/ Pain       Pain Assessment Pain Assessment: Faces Faces Pain Scale: Hurts a little bit Pain Location: right hip Pain Descriptors / Indicators: Discomfort Pain Intervention(s): Monitored during session  Home Living                                          Prior Functioning/Environment              Frequency  Min 1X/week        Progress Toward Goals  OT Goals(current goals can now be found in the care plan section)  Progress towards OT goals: Progressing toward goals  Acute Rehab OT  Goals Patient Stated Goal: get better OT Goal Formulation: With patient Time For Goal Achievement: 10/30/23 Potential to Achieve Goals: Fair ADL Goals Pt Will Perform Upper Body Bathing: Independently;sitting Pt Will Perform Lower Body Bathing: with supervision;sitting/lateral leans Pt Will Transfer to Toilet: with contact guard assist;squat pivot transfer;bedside commode Additional ADL Goal #1: Pt will be able to sit at EOB for 8 mins to prepare for sitting in chair or WC.  Plan      Co-evaluation                 AM-PAC OT "6 Clicks" Daily Activity     Outcome Measure   Help from another person eating meals?: None Help from another person taking care of personal grooming?: A Little Help from another person toileting, which includes using toliet, bedpan, or urinal?: Total Help from another person bathing (including washing, rinsing, drying)?: A Lot Help from another person to put on and taking off regular upper body clothing?: A Little Help from another person to put on and taking off regular lower body clothing?: A Lot 6 Click Score: 15    End of Session    OT Visit Diagnosis: Unsteadiness on feet (R26.81);Other abnormalities of gait and mobility (R26.89);Repeated falls (R29.6);Muscle weakness (generalized) (M62.81);History of falling (Z91.81);Pain Pain - Right/Left: Right Pain - part of body: Hip   Activity Tolerance Patient tolerated treatment well   Patient Left in bed;with call bell/phone within reach;with bed alarm set   Nurse Communication Mobility status        Time: 1601-0932 OT Time Calculation (min): 58 min  Charges: OT General Charges $OT Visit: 1 Visit OT Treatments $Self Care/Home Management : 53-67 mins  Alfonse Flavors, OTA Acute Rehabilitation Services  Office [phone]   Dewain Penning 10/22/2023, 12:28 PM

## 2023-10-22 NOTE — Assessment & Plan Note (Addendum)
A1c 6.1%.  Has insulin pump at home where he gets around 12 units/day, but it is removed currently.  CBGs ranging 140-240, 2 units SAI in past 24 hours. -Very sensitive SSI -CBGs ACHS

## 2023-10-22 NOTE — Assessment & Plan Note (Signed)
Hepatitis B surface antibodies below immunity threshold Oct 30, 2023 and is at high risk of infection on HD. -Hepatitis B vaccine administered 11/28, will need a second shot in series in at least 1 month

## 2023-10-22 NOTE — Assessment & Plan Note (Addendum)
Foley catheter in place.  UOP not documented past 24 hours, but 400 mL noted while on rounds. -Strict I&Os -Remove Foley for void trial today, replace if I&O x3 and still retaining on Q6h bladder scan

## 2023-10-23 DIAGNOSIS — S72001A Fracture of unspecified part of neck of right femur, initial encounter for closed fracture: Secondary | ICD-10-CM | POA: Diagnosis not present

## 2023-10-23 LAB — CBC
HCT: 26.3 % — ABNORMAL LOW (ref 39.0–52.0)
HCT: 28.8 % — ABNORMAL LOW (ref 39.0–52.0)
Hemoglobin: 8 g/dL — ABNORMAL LOW (ref 13.0–17.0)
Hemoglobin: 8.6 g/dL — ABNORMAL LOW (ref 13.0–17.0)
MCH: 28.2 pg (ref 26.0–34.0)
MCH: 28.4 pg (ref 26.0–34.0)
MCHC: 30.4 g/dL (ref 30.0–36.0)
MCHC: 29.9 g/dL — ABNORMAL LOW (ref 30.0–36.0)
MCV: 92.6 fL (ref 80.0–100.0)
MCV: 95 fL (ref 80.0–100.0)
Platelets: 166 10*3/uL (ref 150–400)
Platelets: 180 10*3/uL (ref 150–400)
RBC: 2.84 MIL/uL — ABNORMAL LOW (ref 4.22–5.81)
RBC: 3.03 MIL/uL — ABNORMAL LOW (ref 4.22–5.81)
RDW: 15.9 % — ABNORMAL HIGH (ref 11.5–15.5)
RDW: 16.1 % — ABNORMAL HIGH (ref 11.5–15.5)
WBC: 9.1 10*3/uL (ref 4.0–10.5)
WBC: 8.9 10*3/uL (ref 4.0–10.5)
nRBC: 0 % (ref 0.0–0.2)
nRBC: 0 % (ref 0.0–0.2)

## 2023-10-23 LAB — RENAL FUNCTION PANEL
Albumin: 2.3 g/dL — ABNORMAL LOW (ref 3.5–5.0)
Albumin: 2.4 g/dL — ABNORMAL LOW (ref 3.5–5.0)
Anion gap: 9 (ref 5–15)
Anion gap: 13 (ref 5–15)
BUN: 69 mg/dL — ABNORMAL HIGH (ref 6–20)
BUN: 74 mg/dL — ABNORMAL HIGH (ref 6–20)
CO2: 26 mmol/L (ref 22–32)
CO2: 23 mmol/L (ref 22–32)
Calcium: 7.8 mg/dL — ABNORMAL LOW (ref 8.9–10.3)
Calcium: 7.9 mg/dL — ABNORMAL LOW (ref 8.9–10.3)
Chloride: 100 mmol/L (ref 98–111)
Chloride: 99 mmol/L (ref 98–111)
Creatinine, Ser: 8.28 mg/dL — ABNORMAL HIGH (ref 0.61–1.24)
Creatinine, Ser: 8.68 mg/dL — ABNORMAL HIGH (ref 0.61–1.24)
GFR, Estimated: 7 mL/min — ABNORMAL LOW (ref >60)
GFR, Estimated: 7 mL/min — ABNORMAL LOW (ref >60)
Glucose, Bld: 124 mg/dL — ABNORMAL HIGH (ref 70–99)
Glucose, Bld: 129 mg/dL — ABNORMAL HIGH (ref 70–99)
Phosphorus: 7.3 mg/dL — ABNORMAL HIGH (ref 2.5–4.6)
Phosphorus: 7 mg/dL — ABNORMAL HIGH (ref 2.5–4.6)
Potassium: 5.1 mmol/L (ref 3.5–5.1)
Potassium: 5 mmol/L (ref 3.5–5.1)
Sodium: 135 mmol/L (ref 135–145)
Sodium: 135 mmol/L (ref 135–145)

## 2023-10-23 LAB — GLUCOSE, CAPILLARY
Glucose-Capillary: 124 mg/dL — ABNORMAL HIGH (ref 70–99)
Glucose-Capillary: 126 mg/dL — ABNORMAL HIGH (ref 70–99)
Glucose-Capillary: 151 mg/dL — ABNORMAL HIGH (ref 70–99)
Glucose-Capillary: 171 mg/dL — ABNORMAL HIGH (ref 70–99)
Glucose-Capillary: 173 mg/dL — ABNORMAL HIGH (ref 70–99)
Glucose-Capillary: 193 mg/dL — ABNORMAL HIGH (ref 70–99)

## 2023-10-23 MED ORDER — WHITE PETROLATUM EX OINT: TOPICAL_OINTMENT | CUTANEOUS | Status: DC | PRN

## 2023-10-23 MED ORDER — HEPARIN SODIUM (PORCINE) 1000 UNIT/ML IJ SOLN
3800.0000 [IU] | Freq: Once | INTRAMUSCULAR | Status: AC
  Administered 2023-10-23: 3800 [IU] via INTRAVENOUS

## 2023-10-23 MED ORDER — DARBEPOETIN ALFA 150 MCG/0.3ML IJ SOSY
150.0000 ug | PREFILLED_SYRINGE | INTRAMUSCULAR | Status: DC
  Administered 2023-10-23: 150 ug via SUBCUTANEOUS
  Filled 2023-10-23: qty 0.3

## 2023-10-23 MED ORDER — HYDRALAZINE HCL 50 MG PO TABS
50.0000 mg | ORAL_TABLET | Freq: Two times a day (BID) | ORAL | Status: DC
  Administered 2023-10-23 – 2023-10-26 (×7): 50 mg via ORAL
  Filled 2023-10-23 (×7): qty 1

## 2023-10-23 NOTE — Progress Notes (Signed)
Daily Progress Note Intern Pager: [phone]  Patient name: [name] [name] Medical record number: [id number] Date of birth: 06/01/1971 Age: 52 y.o. Gender: male  Primary Care Provider: [name], MD Consultants: Orthopedic Surgery, Nephrology, Interventional Radiology  Code Status: FULL  Pt Overview and Major Events to Date:  11/20 - Admitted to FMTS 10/17/2023 - Surgical fixation of fracture, TDC placement per IR 11/23 - First HD session 11/26 - AV fistula placement   Assessment and Plan: [name] [name] is a 52 y.o. male with a pertinent PMH of frequent falls, CKD stage IV, MDD, R BKA, T2DM, and PAD who presented with R intertrochanteric proximal femur fracture and was admitted for fixation of fracture and falls workup. Now s/p fracture fixation on 10-17-23 and TDC placement on 10/17/2023, as well as AV fistula placement 11/26.  Patient still awaiting placement with SNF and setup of associated HD, no changes in condition from yesterday.  Continuing to complain of R stump pulling sensation and believe most likely etiology is nerve pain, but patient is already on gabapentin.  XR R knee showing only small suprapatellar effusion. Assessment & Plan Closed intertrochanteric fracture of right hip (HCC) Secondary to mechanical fall.  Frequent falls at home thought to be in the setting of BKA, uremia, deconditioning. Stable s/p fracture fixation on Oct 17, 2023.  Still no BM on current bowel regimen and will cautiously add lactulose today given large amount of opiates. -Appreciate ongoing Ortho recs -Pain regimen: Dilaudid 0.5-1 mg Q4h PRN, oxycodone 5-10 Q4h PRN, oxycodone 10-15 Q4h PRN -Bowel regimen: Miralax BID, senna BID; add lactulose 10 mg once, redose 10 mg at a time if no BM -Continue calcitriol 0.51mcg BID -PT/OT continue to treat, recommending SNF -Fall precautions Acute kidney injury superimposed on CKD (HCC) On HD schedule TTS, holiday 11/28 and next session 11/30.  Creatinine now  downtrending from peak, UOP not documented past 24 hours.  Received AV fistula 11/27. Working on establishing outpatient HD. -Appreciate ongoing Nephrology recs -Holding [place] for now -Avoid nephrotoxic agents -Strict I&Os -Continue calcitriol and Renvela per Nephro -RFP daily Anemia Hemoglobin stable.  Acute on chronic anemia during this admission suspected to be in the setting of anemia of chronic disease vs acute blood loss in the setting of fracture vs EPO deficiency in setting of progressed CKD. -Transfusion threshold <7 -Iron infusion on HD days x5 doses -Appreciate ongoing Nephro recs Controlled diabetes mellitus type 2 with complications (HCC) A1c 6.1%.  Has insulin pump at home where he gets around 12 units/day, but it is removed currently.  CBGs ranging 140-240, 2 units SAI in past 24 hours. -Very sensitive SSI -CBGs ACHS Urinary retention Foley catheter in place.  UOP not documented past 24 hours, but 400 mL noted while on rounds. -Strict I&Os -Remove Foley for void trial today, replace if I&O x3 and still retaining on Q6h bladder scan Nonimmune to hepatitis B virus Hepatitis B surface antibodies below immunity threshold 17-Oct-2023 and is at high risk of infection on HD. -Hepatitis B vaccine administered 11/28, will need a second shot in series in at least 1 month  Chronic and Stable Problems: Hypertension: Continue home carvedilol 12.5 mg BID, diltiazem 300 mg daily, hydralazine 100 mg BID HFpEF: Holding Lasix Nicotine dependence: Half pack a day, declines nicotine patch PAD: s/p right BKA, encourage nicotine cessation Neuropathy: Continue gabapentin 300 mg daily Hyperlipidemia: Continue atorvastatin 400mg  daily MDD: Continue fluoxetine 10 mg daily  FEN/GI: Renal with fluid restriction PPx: Aspirin, heparin with  dialysis Dispo: SNF pending placement  Subjective:  This morning, patient states that he is still feeling right stump pain, describes it as a pulling sensation  that is 4/10 on pain scale.  Otherwise, patient states he is feeling fine and is ready to go once placed in SNF.  Objective: Temp:  [97.4 F (36.3 C)-98 F (36.7 C)] 97.4 F (36.3 C) (11/30 0500) Pulse Rate:  [65-82] 82 (11/30 0500) Resp:  [16-18] 17 (11/30 0500) BP: (137-163)/(73-83) 163/81 (11/30 0500) SpO2:  [96 %-97 %] 97 % (11/30 0500)  Physical Exam: General: Age-appropriate, resting comfortably in bed, NAD, alert and at baseline. Cardiovascular: Regular rate and rhythm. Normal S1/S2. No murmurs, rubs, or gallops appreciated. 2+ radial pulses. Abdominal: Normoactive bowel sounds, nondistended. No tenderness to deep or light palpation. No rebound or guarding.  Foley in place. Skin: Warm and dry.  No rashes grossly. Extremities: R BKA.  Stump smooth.  No peripheral edema in LLE.  1+ LE pulses.  Palpable thrill over AV fistula.  Laboratory: Most recent CBC Lab Results  Component Value Date   WBC 9.1 10/23/2023   HGB 8.0 (L) 10/23/2023   HCT 26.3 (L) 10/23/2023   MCV 92.6 10/23/2023   PLT 166 10/23/2023   Most recent BMP    Latest Ref Rng & Units 10/23/2023    3:47 AM  BMP  Glucose 70 - 99 mg/dL 161   BUN 6 - 20 mg/dL 69   Creatinine 0.96 - 1.24 mg/dL 0.45   Sodium 409 - 811 mmol/L 135   Potassium 3.5 - 5.1 mmol/L 5.1   Chloride 98 - 111 mmol/L 100   CO2 22 - 32 mmol/L 26   Calcium 8.9 - 10.3 mg/dL 7.8     Other pertinent labs: -None  New Imaging/Diagnostic Tests: -DG R knee 3 view: Small suprapatellar joint effusion.  Leevon Upperman Sharion Dove, MD 10/23/2023, 5:28 AM  PGY-1, [facility] FPTS Intern pager: [phone], text pages welcome Secure chat group [facility] Teaching Service

## 2023-10-23 NOTE — Assessment & Plan Note (Signed)
>>  ASSESSMENT AND PLAN FOR T2DM (TYPE 2 DIABETES MELLITUS) (HCC) WRITTEN ON 10/23/2023  5:44 AM BY SHITAREV, DIMITRY, MD  A1c 6.1%.  Has insulin  pump at home where he gets around 12 units/day, but it is removed currently.  CBGs ranging 140-240, 2 units SAI in past 24 hours. -Very sensitive SSI -CBGs ACHS

## 2023-10-23 NOTE — Plan of Care (Signed)
  Problem: Education: Goal: Ability to describe self-care measures that may prevent or decrease complications (Diabetes Survival Skills Education) will improve Outcome: Progressing   

## 2023-10-23 NOTE — Procedures (Signed)
Patient was seen on dialysis and the procedure was supervised.  BFR 400  Via TDC BP is  145/75.   Patient appears to be tolerating treatment well  Cecille Aver 10/23/2023

## 2023-10-23 NOTE — Assessment & Plan Note (Signed)
Foley catheter in place.  UOP not documented past 24 hours, but 400 mL noted while on rounds. -Strict I&Os -Remove Foley for void trial today, replace if I&O x3 and still retaining on Q6h bladder scan

## 2023-10-23 NOTE — Assessment & Plan Note (Signed)
Hemoglobin stable.  Acute on chronic anemia during this admission suspected to be in the setting of anemia of chronic disease vs acute blood loss in the setting of fracture vs EPO deficiency in setting of progressed CKD. -Transfusion threshold <7 -Iron infusion on HD days x5 doses -Appreciate ongoing Nephro recs

## 2023-10-23 NOTE — Assessment & Plan Note (Signed)
Secondary to mechanical fall.  Frequent falls at home thought to be in the setting of BKA, uremia, deconditioning. Stable s/p fracture fixation on 11/22.  Still no BM on current bowel regimen and will cautiously add lactulose today given large amount of opiates. -Appreciate ongoing Ortho recs -Pain regimen: Dilaudid 0.5-1 mg Q4h PRN, oxycodone 5-10 Q4h PRN, oxycodone 10-15 Q4h PRN -Bowel regimen: Miralax BID, senna BID; add lactulose 10 mg once, redose 10 mg at a time if no BM -Continue calcitriol 0.74mcg BID -PT/OT continue to treat, recommending SNF -Fall precautions

## 2023-10-23 NOTE — Assessment & Plan Note (Signed)
On HD schedule TTS, holiday 11/28 and next session 11/30.  Creatinine now downtrending from peak, UOP not documented past 24 hours.  Received AV fistula 11/27. Working on establishing outpatient HD. -Appreciate ongoing Nephrology recs -Holding [place] for now -Avoid nephrotoxic agents -Strict I&Os -Continue calcitriol and Renvela per Nephro -RFP daily

## 2023-10-23 NOTE — Progress Notes (Addendum)
Received patient in bed to unit.  Alert and oriented.  Informed consent signed and in chart.   TX duration:3.75  Patient tolerated well.  Transported back to the room  Alert, without acute distress.  Hand-off given to patient's nurse.   Access used: right dialysis catheter Access issues: none, ran reversed  Total UF removed: 2L Medication(s) given: venofer   10/23/23 1155  Vitals  Temp 98.3 F (36.8 C)  Temp Source Oral  BP (!) 153/84  MAP (mmHg) 101  BP Location Right Arm  BP Method Automatic  Patient Position (if appropriate) Lying  Pulse Rate (!) 56  Pulse Rate Source Monitor  ECG Heart Rate (!) 113  Resp 12  Oxygen Therapy  SpO2 97 %  O2 Device Room Air  During Treatment Monitoring  Blood Flow Rate (mL/min) 400 mL/min  Arterial Pressure (mmHg) -167.87 mmHg  Venous Pressure (mmHg) 161.2 mmHg  TMP (mmHg) 5.86 mmHg  Ultrafiltration Rate (mL/min) 774 mL/min  Dialysate Flow Rate (mL/min) 300 ml/min  Duration of HD Treatment -hour(s) 3.72 hour(s)  Cumulative Fluid Removed (mL) per Treatment  1974.18  HD Safety Checks Performed Yes  Intra-Hemodialysis Comments Tx completed;Tolerated well  Dialysis Fluid Bolus Normal Saline  Bolus Amount (mL) 300 mL      Nash Bolls S [facility]

## 2023-10-23 NOTE — Progress Notes (Signed)
[facility] NEPHROLOGY PROGRESS NOTE  Assessment/ Plan: Pt is a 52 y.o. yo male with a history of DM 2, HTN, urinary retention, PAD (status post right BKA), HLD CKD5, presented after a fall and hip fracture.  # Fall/right intertrochanteric femur fracture status post IM nail/operative repair on 11/22.  Further management per orthopedic surgery.  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  Agreed to start dialysis.  Status post right IJ TDC by IR on 11/22.  First HD on 11/23.  He had left arm brachiobasilic AVF creation on 11/26 with Dr. Barry Brunner.  Running on TTS schedule  -  HD SW aware to initiate CLIP process.  We are waiting on the choice of SNF to be able to locate an appropriate dialysis unit - no  fluid restriction  - nonoliguric   # Anemia of CKD and iron deficiency.  Iron saturation of 8%.   - IV iron x 5 doses - on aranesp 100 mcg every Friday.  He was previously on ESA outpatient.   increased aranesp to 150 mcg every Saturday  # HTN/volume: optimize volume with HD.   - Note that with regard to discharge meds, we have stopped amlodipine as he is on concurrent diltiazem and should not be on both  -  also currently on coreg and hydral-  will wean BP meds as able with better volume status   # CKD-MBD/hyperphosphatemia: Increased sevelamer, intact PTH is 93.  Reduced calcitriol to 0.25 mcg with HD    Subjective:   UOP 3500 .  BUN and crt both rising off of HD - no new complaints-  seen in HD-  c/o right knee pain    Objective Vital signs in last 24 hours: Vitals:   10/23/23 0500 10/23/23 0747 10/23/23 0749 10/23/23 0803  BP: (!) 163/81 (!) 145/68  137/67  Pulse: 82 87  75  Resp: 17 12  10   Temp: (!) 97.4 F (36.3 C) 97.8 F (36.6 C)    TempSrc: Oral Oral    SpO2: 97% 97%  97%  Weight:   109.1 kg   Height:       Weight change:   Intake/Output Summary (Last 24 hours) at 10/23/2023 0826 Last data filed at 10/23/2023 0744 Gross per 24 hour   Intake 1320 ml  Output 3550 ml  Net -2230 ml       Labs: RENAL PANEL Recent Labs  Lab 10/20/23 0607 10/21/23 0522 10/22/23 0608 10/22/23 1521 10/23/23 0347 10/23/23 0720  NA 135 133* 135 136 135 135  K 4.7 4.3 5.6* 5.1 5.1 5.0  CL 99 100 97* 99 100 99  CO2 22 26 27 24 26 23   GLUCOSE 205* 140* 168* 156* 124* 129*  BUN 69* 44* 62* 68* 69* 74*  CREATININE 7.97* 5.83* 7.73* 7.82* 8.28* 8.68*  CALCIUM 7.8* 7.7* 7.9* 8.1* 7.8* 7.9*  PHOS 7.3* 5.7* 7.0*  --  7.3* 7.0*  ALBUMIN 2.1* 2.2* 2.2*  --  2.3* 2.4*    Liver Function Tests: Recent Labs  Lab 10/22/23 0608 10/23/23 0347 10/23/23 0720  ALBUMIN 2.2* 2.3* 2.4*   No results for input(s): "LIPASE", "AMYLASE" in the last 168 hours.  No results for input(s): "AMMONIA" in the last 168 hours. CBC: Recent Labs    07/01/23 1223 07/01/23 1228 07/29/23 1155 07/29/23 1203 08/26/23 1253 08/26/23 1300 10/07/23 1217 10/07/23 1219 10/14/23 0325 10/14/23 1348 10/19/23 0602 10/20/23 0607 10/22/23 1521 10/23/23 0347 10/23/23 0720  HGB  --    < >  --    < >  --    < >  --    < >  6.3*   < > 8.2* 8.0* 8.5* 8.0* 8.6*  MCV  --   --   --   --   --   --   --    < > 92.6   < > 89.7 90.5 93.4 92.6 95.0  FERRITIN 14*  --  17*  --  24  --  50  --  39  --   --   --   --   --   --   TIBC 349  --  354  --  305  --  260  --  225*  --   --   --   --   --   --   IRON 76  --  91  --  126  --  79  --  19*  --   --   --   --   --   --    < > = values in this interval not displayed.    Cardiac Enzymes: No results for input(s): "CKTOTAL", "CKMB", "CKMBINDEX", "TROPONINI" in the last 168 hours. CBG: Recent Labs  Lab 10/22/23 1612 10/22/23 2002 10/23/23 0023 10/23/23 0415 10/23/23 0735  GLUCAP 131* 188* 151* 126* 171*    Iron Studies:  No results for input(s): "IRON", "TIBC", "TRANSFERRIN", "FERRITIN" in the last 72 hours.  Studies/Results: DG KNEE 3 VIEW RIGHT  Result Date: 10/22/2023 CLINICAL DATA:  Right knee pain. EXAM:  RIGHT KNEE - 3 VIEW COMPARISON:  October 13, 2023.  February 07, 2021. FINDINGS: Status post below-knee amputation. No acute fracture or dislocation is noted. Small suprapatellar joint effusion may be present. IMPRESSION: Status post below-knee amputation. Small suprapatellar joint effusion. Electronically Signed   By: Lupita Raider M.D.   On: 10/22/2023 20:30    Medications: Infusions:  iron sucrose      Scheduled Medications:  acetaminophen  650 mg Oral Q6H   aspirin  325 mg Oral Daily   atorvastatin  40 mg Oral QHS   baclofen  10 mg Oral Daily   calcitRIOL  0.25 mcg Oral Q T,Th,Sa-HD   carvedilol  12.5 mg Oral BID WC   Chlorhexidine Gluconate Cloth  6 each Topical Q0600   Chlorhexidine Gluconate Cloth  6 each Topical Q0600   darbepoetin (ARANESP) injection - DIALYSIS  150 mcg Subcutaneous Q Fri-1800   diltiazem  300 mg Oral Daily   FLUoxetine  20 mg Oral Daily   gabapentin  300 mg Oral Daily   hydrALAZINE  100 mg Oral BID   influenza vac split trivalent PF  0.5 mL Intramuscular Tomorrow-1000   insulin aspart  0-6 Units Subcutaneous TID WC   polyethylene glycol  17 g Oral BID   senna  1 tablet Oral BID   sevelamer carbonate  1,600 mg Oral TID WC   sodium chloride flush  3 mL Intravenous Q12H   tamsulosin  0.4 mg Oral QPC supper   Vitamin D (Ergocalciferol)  50,000 Units Oral Q7 days    have reviewed scheduled and prn medications.  Physical Exam:      General adult male in bed in no acute distress HEENT normocephalic atraumatic extraocular movements intact sclera anicteric Neck supple trachea midline Lungs clear to auscultation bilaterally normal work of breathing at rest on room air Heart S1S2 no rub Abdomen soft nontender nondistended Extremities right BKA; trace  edema  Psych blunted affect  Neuro alert and oriented x 3 provides hx and follows commands Access RIJ tunn catheter; LUE AVF  with bruit and thrill    [name] [name] 10/23/2023,8:26 AM  LOS: 10 days

## 2023-10-23 NOTE — Assessment & Plan Note (Signed)
A1c 6.1%.  Has insulin pump at home where he gets around 12 units/day, but it is removed currently.  CBGs ranging 140-240, 2 units SAI in past 24 hours. -Very sensitive SSI -CBGs ACHS

## 2023-10-23 NOTE — Assessment & Plan Note (Signed)
Hepatitis B surface antibodies below immunity threshold Oct 30, 2023 and is at high risk of infection on HD. -Hepatitis B vaccine administered 11/28, will need a second shot in series in at least 1 month

## 2023-10-24 DIAGNOSIS — S72001A Fracture of unspecified part of neck of right femur, initial encounter for closed fracture: Secondary | ICD-10-CM | POA: Diagnosis not present

## 2023-10-24 DIAGNOSIS — M25561 Pain in right knee: Secondary | ICD-10-CM | POA: Insufficient documentation

## 2023-10-24 LAB — GLUCOSE, CAPILLARY
Glucose-Capillary: 121 mg/dL — ABNORMAL HIGH (ref 70–99)
Glucose-Capillary: 203 mg/dL — ABNORMAL HIGH (ref 70–99)
Glucose-Capillary: 126 mg/dL — ABNORMAL HIGH (ref 70–99)
Glucose-Capillary: 174 mg/dL — ABNORMAL HIGH (ref 70–99)

## 2023-10-24 MED ORDER — POLYETHYLENE GLYCOL 3350 17 G PO PACK
34.0000 g | PACK | Freq: Two times a day (BID) | ORAL | Status: DC
  Administered 2023-10-24: 34 g via ORAL
  Filled 2023-10-24: qty 2

## 2023-10-24 MED ORDER — POLYETHYLENE GLYCOL 3350 17 G PO PACK
34.0000 g | PACK | Freq: Three times a day (TID) | ORAL | Status: DC
  Administered 2023-10-24 – 2023-10-26 (×7): 34 g via ORAL
  Filled 2023-10-24 (×7): qty 2

## 2023-10-24 NOTE — Plan of Care (Signed)
  Problem: Education: Goal: Knowledge of General Education information will improve Description: Including pain rating scale, medication(s)/side effects and non-pharmacologic comfort measures 10/24/2023 1828 by Anthonette Legato, RN Outcome: Progressing 10/24/2023 1820 by Anthonette Legato, RN Outcome: Progressing   Problem: Pain Management: Goal: General experience of comfort will improve 10/24/2023 1828 by Anthonette Legato, RN Outcome: Progressing 10/24/2023 1820 by Anthonette Legato, RN Outcome: Progressing

## 2023-10-24 NOTE — Assessment & Plan Note (Addendum)
A1c 6.1%.  Has insulin pump at home where he gets around 12 units/day, but it is removed currently.   -Very sensitive SSI -CBGs ACHS

## 2023-10-24 NOTE — Assessment & Plan Note (Addendum)
Reports small BM 2 days ago, will increase miralax.  -Appreciate ongoing Ortho recs -Pain regimen: Dilaudid 0.5-1 mg Q4h PRN, oxycodone 5-10 Q4h PRN, oxycodone 10-15 Q4h PRN -Bowel regimen: Miralax BID, senna BID -Continue calcitriol 0.37mcg BID -PT/OT continue to treat, recommending SNF -Fall precautions

## 2023-10-24 NOTE — Progress Notes (Addendum)
Daily Progress Note Intern Pager: [phone]  Patient name: DIAZ MATUSIK Medical record number: [id number] Date of birth: 13-Apr-1971 Age: 52 y.o. Gender: male  Primary Care Provider: [name], MD Consultants: Orthopedic Surgery, Nephrology, Interventional Radiology  Code Status: FULL   Pt Overview and Major Events to Date:  11/20 - Admitted to FMTS 11/22 - Surgical fixation of fracture, TDC placement per IR 11/23 - First HD session 11/26 - AV fistula placement  Assessment and Plan: BRAVIN HELMAN is a 52 y.o. male with a pertinent PMH of frequent falls, CKD stage IV, MDD, R BKA, T2DM, and PAD who presented with R intertrochanteric proximal femur fracture and was admitted for fixation of fracture and falls workup. Now s/p fracture fixation on 11/22 and TDC placement on 11/22, as well as AV fistula placement 11/26.   Assessment & Plan Closed intertrochanteric fracture of right hip (HCC) Reports small BM 2 days ago, will increase miralax.  -Appreciate ongoing Ortho recs -Pain regimen: Dilaudid 0.5-1 mg Q4h PRN, oxycodone 5-10 Q4h PRN, oxycodone 10-15 Q4h PRN -Bowel regimen: Miralax BID, senna BID -Continue calcitriol 0.26mcg BID -PT/OT continue to treat, recommending SNF -Fall precautions Acute kidney injury superimposed on CKD (HCC) On HD schedule TTS -Appreciate ongoing Nephrology recs -Holding [place] for now -Avoid nephrotoxic agents -Strict I&Os -Continue calcitriol and Renvela per Nephro -RFP daily Anemia Hemoglobin stable.  Acute on chronic anemia during this admission suspected to be in the setting of anemia of chronic disease vs acute blood loss in the setting of fracture vs EPO deficiency in setting of progressed CKD. -Transfusion threshold <7 -Iron infusion on HD days x5 doses -Appreciate ongoing Nephro recs Controlled diabetes mellitus type 2 with complications (HCC) A1c 6.1%.  Has insulin pump at home where he gets around 12 units/day, but it  is removed currently.   -Very sensitive SSI -CBGs ACHS Urinary retention Foley catheter in place. -Strict I&Os Nonimmune to hepatitis B virus -Hepatitis B vaccine administered 11/28, will need a second shot in series in at least 1 month Right knee pain Anterior R knee pain. Neg XR yesterday. Reports slight improvement today. - Pain reg as above - Consider ortho consult if persistent   Chronic and Stable Problems:  Hypertension: Continue home carvedilol 12.5 mg BID, diltiazem 300 mg daily. Decreased hydralazine 50 mg BID HFpEF: Holding Lasix Nicotine dependence: Half pack a day, declines nicotine patch PAD: s/p right BKA, encourage nicotine cessation Neuropathy: Continue gabapentin 300 mg daily Hyperlipidemia: Continue atorvastatin 400mg  daily MDD: Continue fluoxetine 10 mg daily   FEN/GI: Renal with fluid restriction  PPx: Aspirin, heparin with dialysis  Dispo:SNF pending placement   Subjective:  Continues to have right knee pain, rating it 8 out of 10.  Reports that it feels slightly better compared to yesterday.  Reports that he thinks he had a small bowel movement 2 days ago.  Objective: Temp:  [97.8 F (36.6 C)-98.7 F (37.1 C)] 98.5 F (36.9 C) (12/01 0437) Pulse Rate:  [56-93] 83 (12/01 0437) Resp:  [8-18] 17 (12/01 0437) BP: (133-173)/(61-85) 166/84 (12/01 0437) SpO2:  [95 %-100 %] 97 % (12/01 0437) Weight:  [107.2 kg-109.1 kg] 107.2 kg (11/30 1159) Physical Exam: General: Alert, pleasant, nontoxic appearing man. NAD. HEENT: NCAT. MMM. CV: RRR Resp: CTAB, no wheezing or crackles. Normal WOB on RA.  Abm: Soft, nontender, nondistended. BS present. Ext: Tender to palpation along anterior aspect of right knee.  Right stump is nontender. Skin: Warm, well perfused   Laboratory:  Most recent CBC Lab Results  Component Value Date   WBC 8.9 10/23/2023   HGB 8.6 (L) 10/23/2023   HCT 28.8 (L) 10/23/2023   MCV 95.0 10/23/2023   PLT 180 10/23/2023   Most recent  BMP    Latest Ref Rng & Units 10/23/2023    7:20 AM  BMP  Glucose 70 - 99 mg/dL 161   BUN 6 - 20 mg/dL 74   Creatinine 0.96 - 1.24 mg/dL 0.45   Sodium 409 - 811 mmol/L 135   Potassium 3.5 - 5.1 mmol/L 5.0   Chloride 98 - 111 mmol/L 99   CO2 22 - 32 mmol/L 23   Calcium 8.9 - 10.3 mg/dL 7.9    Lincoln Brigham, MD 10/24/2023, 5:06 AM  PGY-2,  Family Medicine FPTS Intern pager: [phone], text pages welcome Secure chat group [facility] Teaching Service

## 2023-10-24 NOTE — Assessment & Plan Note (Addendum)
On HD schedule TTS -Appreciate ongoing Nephrology recs -Holding [place] for now -Avoid nephrotoxic agents -Strict I&Os -Continue calcitriol and Renvela per Nephro -RFP daily

## 2023-10-24 NOTE — Assessment & Plan Note (Signed)
Anterior R knee pain. Neg XR yesterday. Reports slight improvement today. - Pain reg as above - Consider ortho consult if persistent

## 2023-10-24 NOTE — Plan of Care (Signed)
  Problem: Education: Goal: Ability to describe self-care measures that may prevent or decrease complications (Diabetes Survival Skills Education) will improve Outcome: Progressing   Problem: Coping: Goal: Ability to adjust to condition or change in health will improve Outcome: Progressing   Problem: Fluid Volume: Goal: Ability to maintain a balanced intake and output will improve Outcome: Progressing   Problem: Health Behavior/Discharge Planning: Goal: Ability to identify and utilize available resources and services will improve Outcome: Progressing   Problem: Metabolic: Goal: Ability to maintain appropriate glucose levels will improve Outcome: Progressing   Problem: Nutritional: Goal: Maintenance of adequate nutrition will improve Outcome: Progressing   Problem: Skin Integrity: Goal: Risk for impaired skin integrity will decrease Outcome: Progressing   Problem: Tissue Perfusion: Goal: Adequacy of tissue perfusion will improve Outcome: Progressing   Problem: Clinical Measurements: Goal: Ability to maintain clinical measurements within normal limits will improve Outcome: Progressing Goal: Will remain free from infection Outcome: Progressing Goal: Diagnostic test results will improve Outcome: Progressing Goal: Respiratory complications will improve Outcome: Progressing Goal: Cardiovascular complication will be avoided Outcome: Progressing

## 2023-10-24 NOTE — Assessment & Plan Note (Signed)
>>  ASSESSMENT AND PLAN FOR T2DM (TYPE 2 DIABETES MELLITUS) (HCC) WRITTEN ON 10/24/2023  6:24 AM BY ZHENG, JACKY, MD  A1c 6.1%.  Has insulin  pump at home where he gets around 12 units/day, but it is removed currently.   -Very sensitive SSI -CBGs ACHS

## 2023-10-24 NOTE — Plan of Care (Signed)
Consulted PA Thyra Breed with [facility] about patient's continued right knee pain.  He is well-known to the practice and she does not see any indication for MRI of the knee at this time given right knee x-ray was generally unremarkable.  Suspect pain could be referred from the hip injury sustained from fall. PA was pleasant and agreed to see patient tomorrow and possibly provide more recommendation after seeing patient.  Jerre Simon, MD PGY-3, [facility] Resident  Please page [phone] with questions.

## 2023-10-24 NOTE — Assessment & Plan Note (Addendum)
-  Hepatitis B vaccine administered 11/28, will need a second shot in series in at least 1 month

## 2023-10-24 NOTE — Assessment & Plan Note (Signed)
Hemoglobin stable.  Acute on chronic anemia during this admission suspected to be in the setting of anemia of chronic disease vs acute blood loss in the setting of fracture vs EPO deficiency in setting of progressed CKD. -Transfusion threshold <7 -Iron infusion on HD days x5 doses -Appreciate ongoing Nephro recs

## 2023-10-24 NOTE — Assessment & Plan Note (Signed)
Foley catheter in place. -Strict I&Os

## 2023-10-24 NOTE — Progress Notes (Signed)
[facility] NEPHROLOGY PROGRESS NOTE  Assessment/ Plan: Pt is a 52 y.o. yo male with a history of DM 2, HTN, urinary retention, PAD (status post right BKA), HLD CKD5, presented after a fall and hip fracture.  # Fall/right intertrochanteric femur fracture status post IM nail/operative repair on 11/22.  Further management per orthopedic surgery.  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  Agreed to start dialysis.  Status post right IJ TDC by IR on 11/22.  First HD on 11/23.  He had left arm brachiobasilic AVF creation on 11/26 with Dr. Barry Brunner.  Running on TTS schedule so next HD will be 12/3 -  HD SW aware to initiate CLIP process.  We are waiting on the choice of SNF to be able to locate an appropriate dialysis unit - no  fluid restriction  - nonoliguric   # Anemia of CKD and iron deficiency.  Iron saturation of 8%.   - IV iron x 5 doses - on aranesp 100 mcg every Friday.  He was previously on ESA outpatient.   increased aranesp to 150 mcg every Saturday  # HTN/volume: optimize volume with HD.   - Note that with regard to discharge meds, we have stopped amlodipine as he is on concurrent diltiazem and should not be on both  -  also currently on coreg and hydral-  will wean BP meds as able with better volume status -  seems to be close to EDW at this time   # CKD-MBD/hyperphosphatemia: Increased sevelamer, intact PTH is 93.  Reduced calcitriol to 0.25 mcg with HD    Subjective:   UOP 3500 again .  HD yest- removed 2 liters-   has an odd affect-  still c/o knee pain -  tells me ortho is to see ?   Objective Vital signs in last 24 hours: Vitals:   10/23/23 1613 10/23/23 1932 10/24/23 0437 10/24/23 0728  BP: (!) 163/77 (!) 148/70 (!) 166/84 (!) 159/78  Pulse: 86 83 83 74  Resp: 15 18 17    Temp: 98.3 F (36.8 C) 98.7 F (37.1 C) 98.5 F (36.9 C) 98.4 F (36.9 C)  TempSrc: Oral Oral Oral Oral  SpO2: 97% 96% 97% 97%  Weight:      Height:       Weight change:    Intake/Output Summary (Last 24 hours) at 10/24/2023 0852 Last data filed at 10/24/2023 0500 Gross per 24 hour  Intake 463.06 ml  Output 5500 ml  Net -5036.94 ml       Labs: RENAL PANEL Recent Labs  Lab 10/20/23 0607 10/21/23 0522 10/22/23 0608 10/22/23 1521 10/23/23 0347 10/23/23 0720  NA 135 133* 135 136 135 135  K 4.7 4.3 5.6* 5.1 5.1 5.0  CL 99 100 97* 99 100 99  CO2 22 26 27 24 26 23   GLUCOSE 205* 140* 168* 156* 124* 129*  BUN 69* 44* 62* 68* 69* 74*  CREATININE 7.97* 5.83* 7.73* 7.82* 8.28* 8.68*  CALCIUM 7.8* 7.7* 7.9* 8.1* 7.8* 7.9*  PHOS 7.3* 5.7* 7.0*  --  7.3* 7.0*  ALBUMIN 2.1* 2.2* 2.2*  --  2.3* 2.4*    Liver Function Tests: Recent Labs  Lab 10/22/23 0608 10/23/23 0347 10/23/23 0720  ALBUMIN 2.2* 2.3* 2.4*   No results for input(s): "LIPASE", "AMYLASE" in the last 168 hours.  No results for input(s): "AMMONIA" in the last 168 hours. CBC: Recent Labs    07/01/23 1223 07/01/23 1228 07/29/23 1155 07/29/23 1203  08/26/23 1253 08/26/23 1300 10/07/23 1217 10/07/23 1219 10/14/23 0325 10/14/23 1348 10/19/23 0602 10/20/23 0607 10/22/23 1521 10/23/23 0347 10/23/23 0720  HGB  --    < >  --    < >  --    < >  --    < > 6.3*   < > 8.2* 8.0* 8.5* 8.0* 8.6*  MCV  --   --   --   --   --   --   --    < > 92.6   < > 89.7 90.5 93.4 92.6 95.0  FERRITIN 14*  --  17*  --  24  --  50  --  39  --   --   --   --   --   --   TIBC 349  --  354  --  305  --  260  --  225*  --   --   --   --   --   --   IRON 76  --  91  --  126  --  79  --  19*  --   --   --   --   --   --    < > = values in this interval not displayed.    Cardiac Enzymes: No results for input(s): "CKTOTAL", "CKMB", "CKMBINDEX", "TROPONINI" in the last 168 hours. CBG: Recent Labs  Lab 10/23/23 0735 10/23/23 1248 10/23/23 1612 10/23/23 2127 10/24/23 0750  GLUCAP 171* 124* 193* 173* 121*    Iron Studies:  No results for input(s): "IRON", "TIBC", "TRANSFERRIN", "FERRITIN" in the last  72 hours.  Studies/Results: DG KNEE 3 VIEW RIGHT  Result Date: 10/22/2023 CLINICAL DATA:  Right knee pain. EXAM: RIGHT KNEE - 3 VIEW COMPARISON:  October 13, 2023.  February 07, 2021. FINDINGS: Status post below-knee amputation. No acute fracture or dislocation is noted. Small suprapatellar joint effusion may be present. IMPRESSION: Status post below-knee amputation. Small suprapatellar joint effusion. Electronically Signed   By: Lupita Raider M.D.   On: 10/22/2023 20:30    Medications: Infusions:  iron sucrose Stopped (10/23/23 1206)    Scheduled Medications:  acetaminophen  650 mg Oral Q6H   aspirin  325 mg Oral Daily   atorvastatin  40 mg Oral QHS   baclofen  10 mg Oral Daily   calcitRIOL  0.25 mcg Oral Q T,Th,Sa-HD   carvedilol  12.5 mg Oral BID WC   Chlorhexidine Gluconate Cloth  6 each Topical Q0600   Chlorhexidine Gluconate Cloth  6 each Topical Q0600   darbepoetin (ARANESP) injection - DIALYSIS  150 mcg Subcutaneous Q Sat-1800   diltiazem  300 mg Oral Daily   FLUoxetine  20 mg Oral Daily   gabapentin  300 mg Oral Daily   hydrALAZINE  50 mg Oral BID   influenza vac split trivalent PF  0.5 mL Intramuscular Tomorrow-1000   insulin aspart  0-6 Units Subcutaneous TID WC   polyethylene glycol  34 g Oral BID   senna  1 tablet Oral BID   sevelamer carbonate  1,600 mg Oral TID WC   sodium chloride flush  3 mL Intravenous Q12H   tamsulosin  0.4 mg Oral QPC supper   Vitamin D (Ergocalciferol)  50,000 Units Oral Q7 days    have reviewed scheduled and prn medications.  Physical Exam:      General adult male in bed in no acute distress HEENT normocephalic atraumatic extraocular movements intact sclera anicteric Neck  supple trachea midline Lungs clear to auscultation bilaterally normal work of breathing at rest on room air Heart S1S2 no rub Abdomen soft nontender nondistended Extremities right BKA; no  edema  Psych blunted affect  Neuro alert and oriented x 3 provides hx and  follows commands Access RIJ tunn catheter; LUE AVF with bruit and thrill    Harlin Mazzoni A Tobie Hellen 10/24/2023,8:52 AM  LOS: 11 days

## 2023-10-25 DIAGNOSIS — Z794 Long term (current) use of insulin: Secondary | ICD-10-CM | POA: Diagnosis not present

## 2023-10-25 DIAGNOSIS — E118 Type 2 diabetes mellitus with unspecified complications: Secondary | ICD-10-CM | POA: Diagnosis not present

## 2023-10-25 DIAGNOSIS — S72001A Fracture of unspecified part of neck of right femur, initial encounter for closed fracture: Secondary | ICD-10-CM | POA: Diagnosis not present

## 2023-10-25 LAB — GLUCOSE, CAPILLARY
Glucose-Capillary: 115 mg/dL — ABNORMAL HIGH (ref 70–99)
Glucose-Capillary: 188 mg/dL — ABNORMAL HIGH (ref 70–99)
Glucose-Capillary: 194 mg/dL — ABNORMAL HIGH (ref 70–99)
Glucose-Capillary: 243 mg/dL — ABNORMAL HIGH (ref 70–99)

## 2023-10-25 MED ORDER — ACETAMINOPHEN 500 MG PO TABS
1000.0000 mg | ORAL_TABLET | Freq: Four times a day (QID) | ORAL | Status: DC
  Administered 2023-10-25 – 2023-10-26 (×4): 1000 mg via ORAL
  Filled 2023-10-25 (×5): qty 2

## 2023-10-25 NOTE — Assessment & Plan Note (Signed)
Still complaining of right knee pain. Discussed with Thyra Breed, PA with orthopaedics who did not think MRI was indicated at this time but agreed to see patient today.  - Appreciate ortho recs  - Pain reg as above

## 2023-10-25 NOTE — Progress Notes (Signed)
Advised by attending that pt has been accepted at a local snf. Contacted CSW to inquire which clinics snf will transport pt to. Referral submitted to [facility] admissions for review. Will await clinic acceptance and will discuss with pt/team at that time. Will assist as needed.   Olivia Canter Renal Navigator [phone]

## 2023-10-25 NOTE — Plan of Care (Signed)
  Problem: Fluid Volume: Goal: Ability to maintain a balanced intake and output will improve Outcome: Progressing   Problem: Health Behavior/Discharge Planning: Goal: Ability to manage health-related needs will improve Outcome: Progressing   Problem: Metabolic: Goal: Ability to maintain appropriate glucose levels will improve Outcome: Progressing   

## 2023-10-25 NOTE — Assessment & Plan Note (Addendum)
Hemoglobin stable.  Acute on chronic anemia during this admission suspected to be in the setting of anemia of chronic disease vs acute blood loss in the setting of fracture vs EPO deficiency in setting of progressed CKD. -Transfusion threshold <7 -Iron infusion on HD days x5 doses -Appreciate ongoing Nephro recs

## 2023-10-25 NOTE — Assessment & Plan Note (Deleted)
Foley catheter in place. Patient will need to follow up with urology in 2-3 weeks for void trial.  -Strict I&Os

## 2023-10-25 NOTE — Assessment & Plan Note (Addendum)
Foley catheter in place. -Strict I&Os - Will need follow up with urology for void trial in 2-3 weeks

## 2023-10-25 NOTE — Assessment & Plan Note (Addendum)
-  Hepatitis B vaccine administered 11/28, will need a second shot in series in at least 1 month

## 2023-10-25 NOTE — TOC Progression Note (Addendum)
Transition of Care [facility]) - Progression Note    Patient Details  Name: [name] [name] MRN: [id number] Date of Birth: Nov 11, 1971  Transition of Care [facility]) CM/SW Contact  Lorri Frederick, LCSW Phone Number: 10/25/2023, 8:50 AM  Clinical Narrative:   SNF auth approved: [id number], 3 days: 11/3--12/2.  Waiting on confirmation from [facility].   0915: [facility] confirms can receive pt today.  MD informed.  Expected Discharge Plan: Skilled Nursing Facility Barriers to Discharge: Continued Medical Work up  Expected Discharge Plan and Services   Discharge Planning Services: CM Consult Post Acute Care Choice: Skilled Nursing Facility Living arrangements for the past 2 months: Single Family Home                                       Social Determinants of Health (SDOH) Interventions SDOH Screenings   Food Insecurity: No Food Insecurity (10/14/2023)  Housing: Medium Risk (10/14/2023)  Transportation Needs: No Transportation Needs (10/14/2023)  Utilities: Not At Risk (10/14/2023)  Alcohol Screen: Low Risk  (06/08/2023)  Depression (PHQ2-9): Medium Risk (10/12/2023)  Financial Resource Strain: High Risk (08/26/2023)  Physical Activity: Inactive (06/08/2023)  Social Connections: Socially Isolated (06/08/2023)  Stress: Stress Concern Present (08/26/2023)  Tobacco Use: High Risk (10/19/2023)  Health Literacy: Inadequate Health Literacy (06/08/2023)    Readmission Risk Interventions     No data to display

## 2023-10-25 NOTE — Assessment & Plan Note (Addendum)
A1c 6.1%.  Has insulin pump at home where he gets around 12 units/day, but it is removed currently.   -Very sensitive SSI -CBGs ACHS

## 2023-10-25 NOTE — Assessment & Plan Note (Deleted)
A1c 6.1%.  Has insulin pump at home where he gets around 12 units/day, but it is removed currently.   -Very sensitive SSI -CBGs ACHS

## 2023-10-25 NOTE — Progress Notes (Signed)
Daily Progress Note Intern Pager: [phone]  Patient name: [name] [name] Medical record number: [id number] Date of birth: [birth year] Age: 52 y.o. Gender: male  Primary Care Provider: [name], MD Consultants: Ortho, nephrology, IR  Code Status: full   Pt Overview and Major Events to Date:  11/20 - Admitted to FMTS 11/22 - Surgical fixation of fracture, TDC placement per IR 11/23 - First HD session 11/26 - AV fistula placement  Assessment and Plan: [name] [name] is a 52 y.o. male with a pertinent PMH of frequent falls, CKD stage IV, MDD, R BKA, T2DM, and PAD who presented after fall with R intertrochanteric proximal femur fracture. Now s/p fracture fixation on 11/22 and TDC placement on 11/22, as well as AV fistula placement 11/26.  Assessment & Plan Closed intertrochanteric fracture of right hip (HCC) Patient reports no real pain at hip, but is having knee pain still.  -Appreciate ongoing Ortho recs -Pain regimen: discontinue Dilaudid 0.5-1 mg Q4h PRN, oxycodone 5-10 Q4h PRN, oxycodone 10-15 Q4h PRN -Bowel regimen: Miralax BID, senna BID -Continue calcitriol 0.28mcg BID -PT/OT continue to treat- likely d/c to SNF tomorrow  -Fall precautions Acute kidney injury superimposed on CKD (HCC) On HD schedule TTS -Appreciate ongoing Nephrology recs - will discontinue farxiga given he is now on dialysis  -Avoid nephrotoxic agents -Strict I&Os -Continue calcitriol and Renvela per Nephro -RFP daily Anemia Hemoglobin stable.  Acute on chronic anemia during this admission suspected to be in the setting of anemia of chronic disease vs acute blood loss in the setting of fracture vs EPO deficiency in setting of progressed CKD. -Transfusion threshold <7 -Iron infusion on HD days x5 doses -Appreciate ongoing Nephro recs Controlled diabetes mellitus type 2 with complications (HCC) A1c 6.1%.  Has insulin pump at home where he gets around 12 units/day, but it is removed  currently.   -Very sensitive SSI -CBGs ACHS Urinary retention Foley catheter in place. -Strict I&Os - Will need follow up with urology for void trial in 2-3 weeks  Nonimmune to hepatitis B virus -Hepatitis B vaccine administered 11/28, will need a second shot in series in at least 1 month Right knee pain Still complaining of right knee pain. Discussed with Thyra Breed, PA with orthopaedics who did not think MRI was indicated at this time but agreed to see patient today.  - Appreciate ortho recs  - Pain reg as above   Chronic and Stable Problems:  Hypertension: Continue home carvedilol 12.5 mg BID, diltiazem 300 mg daily. Decreased hydralazine 50 mg BID  HFpEF: Holding Lasix discontinue d/t HD  Nicotine dependence: Half pack a day, declines nicotine patch PAD: s/p right BKA, encourage nicotine cessation Neuropathy: Continue gabapentin 300 mg daily  Hyperlipidemia: Continue atorvastatin 400mg  daily MDD: Continue fluoxetine 10 mg daily   FEN/GI: Regular diet  PPx: heparin with dialysis  Dispo:SNF  pending dialysis set up .  Subjective:  Patient reports he is still in a lot of pain with his knee. He was seen by ortho PA this AM, who do not think MRI is indicated at this time. Otherwise, he has no complaints.   Objective: Temp:  [98.1 F (36.7 C)-98.7 F (37.1 C)] 98.4 F (36.9 C) (12/02 0718) Pulse Rate:  [67-84] 67 (12/02 0718) Resp:  [17-18] 17 (12/02 0718) BP: (137-157)/(66-81) 138/78 (12/02 0718) SpO2:  [95 %-98 %] 96 % (12/02 0718) Physical Exam: General: Alert, nontoxic  Cardiovascular: RRR, no m/r/g Respiratory: CTAB, NWOB on RA Abdomen: soft, nontender,  nondistended.  Extremities: TTP along right knee. No TTP at stump. Left leg with big toe amputation, able to move left leg spontaneously.   Laboratory: Most recent CBC Lab Results  Component Value Date   WBC 8.9 10/23/2023   HGB 8.6 (L) 10/23/2023   HCT 28.8 (L) 10/23/2023   MCV 95.0 10/23/2023   PLT 180  10/23/2023   Most recent BMP    Latest Ref Rng & Units 10/23/2023    7:20 AM  BMP  Glucose 70 - 99 mg/dL 161   BUN 6 - 20 mg/dL 74   Creatinine 0.96 - 1.24 mg/dL 0.45   Sodium 409 - 811 mmol/L 135   Potassium 3.5 - 5.1 mmol/L 5.0   Chloride 98 - 111 mmol/L 99   CO2 22 - 32 mmol/L 23   Calcium 8.9 - 10.3 mg/dL 7.9     Other pertinent labs: none   Imaging/Diagnostic Tests: No new imaging  Penne Lash, MD 10/25/2023, 12:16 PM  PGY-1, [facility] FPTS Intern pager: [phone], text pages welcome Secure chat group [facility] Teaching Service

## 2023-10-25 NOTE — Assessment & Plan Note (Deleted)
Hemoglobin stable.  Acute on chronic anemia during this admission suspected to be in the setting of anemia of chronic disease vs acute blood loss in the setting of fracture vs EPO deficiency in setting of progressed CKD. -Transfusion threshold <7 -Iron infusion on HD days x5 doses -Appreciate ongoing Nephro recs

## 2023-10-25 NOTE — Progress Notes (Signed)
Patient ID: [name] [name], male   DOB: [birth year], 52 y.o.   MRN: [id number] [facility] Progress Note   Assessment/ Plan:   1.  Right intertrochanteric femur fracture: Secondary to fall and now status post intramedullary nail/open reduction and internal fixation on 10/15/2023.  Progressing slowly with physical therapy and plans noted for placement to skilled nursing facility. 2. ESRD: With progression from chronic kidney disease stage V/superimposed AKI now to needing dialysis during this hospitalization.  Continue hemodialysis on a TTS schedule via right IJ TDC (dialysis started 11/23).  He is status post left BBF on 11/26. 3. Anemia: Without overt blood loss and status post intravenous iron for low iron saturation seen previously on labs.  Continue ESA with titration. 4. CKD-MBD: Elevated phosphorus level noted for which sevelamer was adjusted.  Continue calcitriol for PTH control.  Calcium level at goal. 5. Nutrition: Currently on renal diet with fluid restriction.  He requests transition to regular diet to help improve his choices-potassium may be a limiting factor. 6. Hypertension: Blood pressures under reasonable control.  Monitor with ongoing medications/hemodialysis.  Subjective:   Inquires about transitioning to a regular diet/relaxation of potassium restrictions.   Objective:   BP 138/78 (BP Location: Right Arm)   Pulse 67   Temp 98.4 F (36.9 C) (Oral)   Resp 17   Ht 6\' 5"  (1.956 m)   Wt 107.2 kg Comment: bed  SpO2 96%   BMI 28.03 kg/m   Physical Exam: Gen: Resting comfortably in bed CVS: Pulse regular rhythm, normal rate, S1 and S2 normal Resp: Clear to auscultation bilaterally, no rales/rhonchi Abd: Soft, flat, nontender, bowel sounds normal Ext: Status post right BKA.  Left leg with venous stasis changes/exfoliating skin.  Left BBF with palpable thrill  Labs: BMET Recent Labs  Lab 10/19/23 0602 10/20/23 0607 10/21/23 0522 10/22/23 0608  10/22/23 1521 10/23/23 0347 10/23/23 0720  NA 137 135 133* 135 136 135 135  K 4.3 4.7 4.3 5.6* 5.1 5.1 5.0  CL 101 99 100 97* 99 100 99  CO2 23 22 26 27 24 26 23   GLUCOSE 108* 205* 140* 168* 156* 124* 129*  BUN 46* 69* 44* 62* 68* 69* 74*  CREATININE 6.26* 7.97* 5.83* 7.73* 7.82* 8.28* 8.68*  CALCIUM 8.0* 7.8* 7.7* 7.9* 8.1* 7.8* 7.9*  PHOS 6.1* 7.3* 5.7* 7.0*  --  7.3* 7.0*   CBC Recent Labs  Lab 10/20/23 0607 10/22/23 1521 10/23/23 0347 10/23/23 0720  WBC 12.7* 9.3 9.1 8.9  HGB 8.0* 8.5* 8.0* 8.6*  HCT 25.8* 28.3* 26.3* 28.8*  MCV 90.5 93.4 92.6 95.0  PLT 152 181 166 180      Medications:     acetaminophen  650 mg Oral Q6H   aspirin  325 mg Oral Daily   atorvastatin  40 mg Oral QHS   baclofen  10 mg Oral Daily   calcitRIOL  0.25 mcg Oral Q T,Th,Sa-HD   carvedilol  12.5 mg Oral BID WC   Chlorhexidine Gluconate Cloth  6 each Topical Q0600   Chlorhexidine Gluconate Cloth  6 each Topical Q0600   darbepoetin (ARANESP) injection - DIALYSIS  150 mcg Subcutaneous Q Sat-1800   diltiazem  300 mg Oral Daily   FLUoxetine  20 mg Oral Daily   gabapentin  300 mg Oral Daily   hydrALAZINE  50 mg Oral BID   influenza vac split trivalent PF  0.5 mL Intramuscular Tomorrow-1000   insulin aspart  0-6 Units Subcutaneous TID WC  polyethylene glycol  34 g Oral TID   senna  1 tablet Oral BID   sevelamer carbonate  1,600 mg Oral TID WC   sodium chloride flush  3 mL Intravenous Q12H   tamsulosin  0.4 mg Oral QPC supper   Vitamin D (Ergocalciferol)  50,000 Units Oral Q7 days   Zetta Bills, MD 10/25/2023, 10:18 AM

## 2023-10-25 NOTE — Progress Notes (Addendum)
Physical Therapy Treatment Patient Details Name: [name] [name] MRN: [id number] DOB: 1970/12/09 Today's Date: 10/25/2023   History of Present Illness Pt is a 52 y/o male admitted on 10/13/23 after presenting with R intertrochanteric fracture from a fall outside of a gas station. Pt is s/p RLE fixation on 10/15/23.  Symptomatic orthostatic hypotension during PT sessions 11/27, 11/29. PMH: frequent falls, CKD IV, MDD, R BKA, DM2, PAD.    PT Comments  Pt received in supine, sleeping but awoken with increased time, pt c/o RLE pain. Pt with symptomatic orthostatic hypotension this date but less severe than on previous session and able to perform short gait trial in room with chair follow for safety prior to dizziness becoming more significant. Pt remains limited due to c/o RLE pain and demonstrates decreased safety awareness/insight and decreased recall of therapy sessions on previous week. Pt will continue to benefit from skilled rehab in a post acute setting < 3 hours/day to maximize functional gains before returning home.   Orthostatic Lying   BP- Lying 153/75  Pulse- Lying 67  Orthostatic Sitting  BP- Sitting 156/83  Pulse- Sitting 68  Orthostatic Standing at 0 minutes  BP- Standing at 0 minutes 130/73  Pulse- Standing at 0 minutes 72   Orthostatic Sitting  BP- Sitting 140/76 (post-ambulation)  Pulse- Sitting 82     If plan is discharge home, recommend the following: A lot of help with walking and/or transfers;A lot of help with bathing/dressing/bathroom;Assist for transportation;Direct supervision/assist for financial management;Assistance with cooking/housework;Help with stairs or ramp for entrance   Can travel by private vehicle     No  Equipment Recommendations  None recommended by PT    Recommendations for Other Services       Precautions / Restrictions Precautions Precautions: Fall Precaution Comments: RLE prosthesis (in room), chronic R BKA Restrictions Weight  Bearing Restrictions: Yes RLE Weight Bearing: Weight bearing as tolerated     Mobility  Bed Mobility Overal bed mobility: Needs Assistance Bed Mobility: Supine to Sit     Supine to sit: Supervision, HOB elevated, Used rails Sit to supine: Supervision, Used rails, HOB elevated   General bed mobility comments: increased time and effort, to L EOB    Transfers Overall transfer level: Needs assistance Equipment used: Rolling walker (2 wheels) Transfers: Sit to/from Stand Sit to Stand: Min assist, From elevated surface           General transfer comment: very elevated bed<>RW pt c/o dizziness which increases with length of time standing, initially not very significant per pt. Cues for safe UE placement and body mechanics, pt unable to push with LUE from bed (pt unable to state why) so pushing with RUE from mattress to stand.    Ambulation/Gait Ambulation/Gait assistance: Min assist, +2 safety/equipment Gait Distance (Feet): 25 Feet Assistive device: Rolling walker (2 wheels) Gait Pattern/deviations: Step-to pattern, Decreased stride length, Trunk flexed       General Gait Details: Close chair follow for safety, with CGA pt at times keeps RLE too far behind, when cued to step with RLE first pt becomes more anxious and reluctant to attempt, despite instruction on tehcnique and how pt can use UE support of RW to offload pain more when stepping with RLE first. Pt defers to sit in chair as dizziness increases but does not say why, and requesting to return back to supine post-exertion.     Balance Overall balance assessment: History of Falls, Needs assistance Sitting-balance support: Feet supported, No upper extremity supported  Sitting balance-Leahy Scale: Fair Sitting balance - Comments: static sitting unsupported fair   Standing balance support: Bilateral upper extremity supported, During functional activity, Reliant on assistive device for balance Standing balance-Leahy Scale:  Poor Standing balance comment: RW and external assist for safety                            Cognition Arousal: Alert Behavior During Therapy: Flat affect Overall Cognitive Status: No family/caregiver present to determine baseline cognitive functioning                                 General Comments: Pt with decreased recall of previous sessions from past week, does not recall telling PTA that he wanted ice last week and now states "the ice made it worse" although at the time pt said pain was slightly improved with ice. Pt reluctant to sit in chair (chair follow provided during gait due to fall risk) despite feeling dizzy, and despite PTA encouragement to sit when symptoms increased. Difficulty following cues for step sequencing with RW for reduced pain.  Pt also thought it was Tuesday AM during session, needed reorientation to time of day/date.      Exercises      General Comments General comments (skin integrity, edema, etc.): see orthostatic BP taken above; HR WFL      Pertinent Vitals/Pain Pain Assessment Pain Assessment: Faces Pain Score: 9  Faces Pain Scale: Hurts whole lot Pain Location: right hip and R knee more severe with WB/gait Pain Descriptors / Indicators: Discomfort, Grimacing, Guarding Pain Intervention(s): Limited activity within patient's tolerance, Monitored during session, Repositioned, Premedicated before session, Other (comment) (per RN pt had Tylenol just prior to session. Pt had heating pad over his RLE when PTA arrived, PTA discourages use of heating pad in acute/sub-acute injury, ice is better for pain/edema until inflammation improves. RN also notified)     PT Goals (current goals can now be found in the care plan section) Acute Rehab PT Goals Patient Stated Goal: None stated PT Goal Formulation: With patient Time For Goal Achievement: 11/01/23 Progress towards PT goals: Progressing toward goals    Frequency    Min  1X/week      PT Plan         AM-PAC PT "6 Clicks" Mobility   Outcome Measure  Help needed turning from your back to your side while in a flat bed without using bedrails?: A Little Help needed moving from lying on your back to sitting on the side of a flat bed without using bedrails?: A Little Help needed moving to and from a bed to a chair (including a wheelchair)?: A Lot Help needed standing up from a chair using your arms (e.g., wheelchair or bedside chair)?: A Lot Help needed to walk in hospital room?: A Lot (chair follow) Help needed climbing 3-5 steps with a railing? : Total 6 Click Score: 13    End of Session Equipment Utilized During Treatment: Gait belt;Other (comment) (RLE prosthesis donned during transfer, doffed once pt in supine) Activity Tolerance: Patient tolerated treatment well;Patient limited by pain;Treatment limited secondary to medical complications (Comment);Other (comment) (symptomatic orthostatic hypotension) Patient left: with call bell/phone within reach;in bed;with bed alarm set;Other (comment) (bed in chair posture, pt requesting lights out, he ordered his dinner tray) Nurse Communication: Mobility status;Patient requests pain meds;Other (comment) (orthostatic hypotension; MD also notified as they requested  update in AM) PT Visit Diagnosis: Muscle weakness (generalized) (M62.81);Unsteadiness on feet (R26.81);Other abnormalities of gait and mobility (R26.89);History of falling (Z91.81);Difficulty in walking, not elsewhere classified (R26.2)     Time: 5284-1324 PT Time Calculation (min) (ACUTE ONLY): 31 min  Charges:    $Gait Training: 8-22 mins $Therapeutic Activity: 8-22 mins PT General Charges $$ ACUTE PT VISIT: 1 Visit                     Lincy Belles P., PTA Acute Rehabilitation Services Secure Chat Preferred 9a-5:30pm Office: [phone]    Dorathy Kinsman [facility] 10/25/2023, 6:18 PM

## 2023-10-25 NOTE — Assessment & Plan Note (Deleted)
Anterior R knee pain. Neg XR yesterday. Consulted orthopedics yesterday who did not recommend further imagining and suspect this is referred pain from the hip injury but will stop by to see him today.  - Pain reg as above

## 2023-10-25 NOTE — Assessment & Plan Note (Deleted)
On HD schedule TTS -Appreciate ongoing Nephrology recs -Holding Chestnut for now -Avoid nephrotoxic agents -Strict I&Os -Continue calcitriol and Renvela per Nephro -RFP daily

## 2023-10-25 NOTE — Assessment & Plan Note (Signed)
>>  ASSESSMENT AND PLAN FOR T2DM (TYPE 2 DIABETES MELLITUS) (HCC) WRITTEN ON 10/25/2023 12:16 PM BY BALOCH, MAHNOOR, MD  A1c 6.1%.  Has insulin  pump at home where he gets around 12 units/day, but it is removed currently.   -Very sensitive SSI -CBGs ACHS

## 2023-10-25 NOTE — Assessment & Plan Note (Addendum)
Patient reports no real pain at hip, but is having knee pain still.  -Appreciate ongoing Ortho recs -Pain regimen: discontinue Dilaudid 0.5-1 mg Q4h PRN, oxycodone 5-10 Q4h PRN, oxycodone 10-15 Q4h PRN -Bowel regimen: Miralax BID, senna BID -Continue calcitriol 0.26mcg BID -PT/OT continue to treat- likely d/c to SNF tomorrow  -Fall precautions

## 2023-10-25 NOTE — Assessment & Plan Note (Deleted)
Ortho following. Patient states pain is well managed on current regimen. Increased bowel regimen yesterday, patient had BM last night.  -Appreciate ongoing Ortho recs -Pain regimen: Dilaudid 0.5-1 mg Q4h PRN, oxycodone 5-10 Q4h PRN, oxycodone 10-15 Q4h PRN -Bowel regimen: Miralax BID, senna BID -Continue calcitriol 0.60mcg BID -PT/OT continue to treat, recommending SNF -Fall precautions

## 2023-10-25 NOTE — Plan of Care (Signed)
Problem: Education: Goal: Ability to describe self-care measures that may prevent or decrease complications (Diabetes Survival Skills Education) will improve Outcome: Progressing Goal: Individualized Educational Video(s) Outcome: Progressing   Problem: Coping: Goal: Ability to adjust to condition or change in health will improve Outcome: Progressing   Problem: Fluid Volume: Goal: Ability to maintain a balanced intake and output will improve Outcome: Progressing   Problem: Health Behavior/Discharge Planning: Goal: Ability to identify and utilize available resources and services will improve Outcome: Progressing Goal: Ability to manage health-related needs will improve Outcome: Progressing   Problem: Metabolic: Goal: Ability to maintain appropriate glucose levels will improve Outcome: Progressing   Problem: Nutritional: Goal: Maintenance of adequate nutrition will improve Outcome: Progressing Goal: Progress toward achieving an optimal weight will improve Outcome: Progressing   Problem: Skin Integrity: Goal: Risk for impaired skin integrity will decrease Outcome: Progressing   Problem: Tissue Perfusion: Goal: Adequacy of tissue perfusion will improve Outcome: Progressing   Problem: Education: Goal: Knowledge of General Education information will improve Description: Including pain rating scale, medication(s)/side effects and non-pharmacologic comfort measures Outcome: Progressing   Problem: Health Behavior/Discharge Planning: Goal: Ability to manage health-related needs will improve Outcome: Progressing   Problem: Clinical Measurements: Goal: Ability to maintain clinical measurements within normal limits will improve Outcome: Progressing Goal: Will remain free from infection Outcome: Progressing Goal: Diagnostic test results will improve Outcome: Progressing Goal: Respiratory complications will improve Outcome: Progressing Goal: Cardiovascular complication will  be avoided Outcome: Progressing   Problem: Activity: Goal: Risk for activity intolerance will decrease Outcome: Progressing   Problem: Nutrition: Goal: Adequate nutrition will be maintained Outcome: Progressing   Problem: Coping: Goal: Level of anxiety will decrease Outcome: Progressing   Problem: Elimination: Goal: Will not experience complications related to bowel motility Outcome: Progressing Goal: Will not experience complications related to urinary retention Outcome: Progressing   Problem: Pain Management: Goal: General experience of comfort will improve Outcome: Progressing   Problem: Safety: Goal: Ability to remain free from injury will improve Outcome: Progressing   Problem: Skin Integrity: Goal: Risk for impaired skin integrity will decrease Outcome: Progressing   Problem: Education: Goal: Knowledge of disease and its progression will improve Outcome: Progressing   Problem: Fluid Volume: Goal: Compliance with measures to maintain balanced fluid volume will improve Outcome: Progressing   Problem: Health Behavior/Discharge Planning: Goal: Ability to manage health-related needs will improve Outcome: Progressing   Problem: Education: Goal: Knowledge of General Education information will improve Description: Including pain rating scale, medication(s)/side effects and non-pharmacologic comfort measures Outcome: Progressing   Problem: Health Behavior/Discharge Planning: Goal: Ability to manage health-related needs will improve Outcome: Progressing   Problem: Clinical Measurements: Goal: Ability to maintain clinical measurements within normal limits will improve Outcome: Progressing Goal: Will remain free from infection Outcome: Progressing Goal: Diagnostic test results will improve Outcome: Progressing Goal: Respiratory complications will improve Outcome: Progressing Goal: Cardiovascular complication will be avoided Outcome: Progressing   Problem:  Activity: Goal: Risk for activity intolerance will decrease Outcome: Progressing   Problem: Nutrition: Goal: Adequate nutrition will be maintained Outcome: Progressing   Problem: Coping: Goal: Level of anxiety will decrease Outcome: Progressing   Problem: Elimination: Goal: Will not experience complications related to bowel motility Outcome: Progressing Goal: Will not experience complications related to urinary retention Outcome: Progressing   Problem: Pain Management: Goal: General experience of comfort will improve Outcome: Progressing   Problem: Safety: Goal: Ability to remain free from injury will improve Outcome: Progressing   Problem: Skin Integrity: Goal: Risk for  impaired skin integrity will decrease Outcome: Progressing

## 2023-10-25 NOTE — Assessment & Plan Note (Deleted)
-  Hepatitis B vaccine administered 11/28, will need a second shot in series in at least 1 month

## 2023-10-25 NOTE — Progress Notes (Signed)
Mobility Specialist: Progress Note   10/25/23 1158  Mobility  Activity Ambulated with assistance in room  Level of Assistance Contact guard assist, steadying assist (minG)  Assistive Device Front wheel walker  Distance Ambulated (ft) 10 ft  Activity Response Tolerated well  Mobility Referral Yes  $Mobility charge 1 Mobility  Mobility Specialist Start Time (ACUTE ONLY) 1051  Mobility Specialist Stop Time (ACUTE ONLY) 1110  Mobility Specialist Time Calculation (min) (ACUTE ONLY) 19 min    Pt was agreeable to mobility session - received in bed. Had c/o pain rated 9/10. Don/doff R prosthesis independently. SV for bed mobility, minA+2 for STS, minG for ambulation. Requested to use BSC. Left on [facility] with all needs met, call bell in reach. NT notified.   Maurene Capes Mobility Specialist Please contact via SecureChat or Rehab office at [phone]

## 2023-10-25 NOTE — Assessment & Plan Note (Addendum)
On HD schedule TTS -Appreciate ongoing Nephrology recs - will discontinue farxiga given he is now on dialysis  -Avoid nephrotoxic agents -Strict I&Os -Continue calcitriol and Renvela per Nephro -RFP daily

## 2023-10-25 NOTE — Progress Notes (Signed)
Orthopaedic Trauma Progress Note  SUBJECTIVE:Doing ok today.  Pain in the hip has been fairly manageable but notes that he has had significant knee pain over the past several days which have limited his ability to mobilize with therapies.  He denies any clicking, popping, locking of the knee.  Does not note any feelings of instability.  Pain is primarily his only issue.  X-rays of the right knee were negative for any fractures.  There was a small suprapatellar joint effusion that was present on imaging but no other acute findings.  I discussed with patient that pain in the right knee is likely referred pain from the hip.  We discussed this is common following hip injury/surgery and the body is trying to offload pressure from the fracture.  We discussed this will likely improve on its own as we get increased fracture healing.  No chest pain. No SOB. No nausea/vomiting. No other complaints.   OBJECTIVE:  Vitals:   10/25/23 0552 10/25/23 0718  BP: (!) 157/81 138/78  Pulse: 84 67  Resp: 18 17  Temp: 98.1 F (36.7 C) 98.4 F (36.9 C)  SpO2: 95% 96%    General: Sitting up in bed, no acute distress Respiratory: No increased work of breathing.  RLE: Incisions CDI. Minimally tender about the hip. Tolerated gentle hip motion. No significant tenderness with palpation throughout the thigh or with palpation over the patella.  Negative patellar apprehension.  No instability with varus or valgus stress testing.  Negative anterior and posterior drawer testing.  Does have some pain with palpation posteriorly around the knee but no significant fluid collection noted.  Skin warm and dry.  IMAGING: Stable post op imaging right hip.  X-rays right knee negative for any fractures or other acute injuries.  Plan for repeat x-rays right hip on 10/29/2023 if still inpatient  LABS:  Results for orders placed or performed during the hospital encounter of 10/13/23 (from the past 24 hour(s))  Glucose, capillary      Status: Abnormal   Collection Time: 10/24/23 11:09 AM  Result Value Ref Range   Glucose-Capillary 203 (H) 70 - 99 mg/dL  Glucose, capillary     Status: Abnormal   Collection Time: 10/24/23  4:32 PM  Result Value Ref Range   Glucose-Capillary 126 (H) 70 - 99 mg/dL  Glucose, capillary     Status: Abnormal   Collection Time: 10/24/23  9:40 PM  Result Value Ref Range   Glucose-Capillary 174 (H) 70 - 99 mg/dL  Glucose, capillary     Status: Abnormal   Collection Time: 10/25/23  7:17 AM  Result Value Ref Range   Glucose-Capillary 115 (H) 70 - 99 mg/dL    ASSESSMENT: [name] [name] is a 52 y.o. male s/p intramedullary nailing right intertrochanteric femur fracture on 10/15/2023  CV/Blood loss: Hemoglobin 8.6 on 10/23/2023.  Stable. Hemodynamically stable  PLAN: Weightbearing: WBAT RLE.  Scale back to TDWB if too much pain ROM: Okay for unrestricted motion Incisional and dressing care: Okay to leave incisions open to air Showering: Okay to shower and get incisions wet Orthopedic device(s): None  Pain management: Continue current multimodal regimen VTE prophylaxis: Aspirin, SCDs ID:  Ancef 2gm post op completed Foley/Lines:  No foley, KVO IVFs Impediments to Fracture Healing: Vit D level 10, started on supplementation Dispo: PT/OT evaluation ongoing.  Discussed with the patient scaling back to touchdown weight-bear on the right lower extremity until pain improves.  Encouraged him to get up and work with therapies today.  TOC following for SNF placement.  Okay for discharge from ortho standpoint once cleared by medicine team and therapies.    D/C recommendations: -Oxycodone, Neurontin, baclofen for pain control -Aspirin 325 mg daily x 30 days for DVT prophylaxis -Continue 50,000 units Vit D supplementation q 7 days x 5-8 weeks  Follow - up plan: 2 weeks after discharge for wound check and repeat x-rays.  Will reevaluate right knee pain at follow-up appointment, if continues to  persist we will discuss possibility of MRI.   Contact information:  Truitt Merle MD, Thyra Breed PA-C. After hours and holidays please check [email or web address] for group call information for [facility]   Thompson Caul, PA-C [phone] (office) [email or web address]

## 2023-10-26 ENCOUNTER — Encounter (HOSPITAL_COMMUNITY): Payer: Self-pay

## 2023-10-26 ENCOUNTER — Ambulatory Visit: Payer: 59 | Admitting: Student

## 2023-10-26 ENCOUNTER — Other Ambulatory Visit (HOSPITAL_COMMUNITY): Payer: Self-pay

## 2023-10-26 DIAGNOSIS — Z789 Other specified health status: Secondary | ICD-10-CM | POA: Diagnosis not present

## 2023-10-26 DIAGNOSIS — I132 Hypertensive heart and chronic kidney disease with heart failure and with stage 5 chronic kidney disease, or end stage renal disease: Secondary | ICD-10-CM | POA: Insufficient documentation

## 2023-10-26 DIAGNOSIS — N179 Acute kidney failure, unspecified: Secondary | ICD-10-CM | POA: Diagnosis not present

## 2023-10-26 DIAGNOSIS — R296 Repeated falls: Secondary | ICD-10-CM | POA: Diagnosis not present

## 2023-10-26 DIAGNOSIS — N185 Chronic kidney disease, stage 5: Secondary | ICD-10-CM | POA: Diagnosis present

## 2023-10-26 DIAGNOSIS — N250 Renal osteodystrophy: Secondary | ICD-10-CM | POA: Insufficient documentation

## 2023-10-26 DIAGNOSIS — K2289 Other specified disease of esophagus: Secondary | ICD-10-CM | POA: Diagnosis not present

## 2023-10-26 DIAGNOSIS — M6702 Short Achilles tendon (acquired), left ankle: Secondary | ICD-10-CM | POA: Diagnosis not present

## 2023-10-26 DIAGNOSIS — R600 Localized edema: Secondary | ICD-10-CM | POA: Diagnosis not present

## 2023-10-26 DIAGNOSIS — D689 Coagulation defect, unspecified: Secondary | ICD-10-CM | POA: Diagnosis not present

## 2023-10-26 DIAGNOSIS — Z743 Need for continuous supervision: Secondary | ICD-10-CM | POA: Diagnosis not present

## 2023-10-26 DIAGNOSIS — S72001A Fracture of unspecified part of neck of right femur, initial encounter for closed fracture: Secondary | ICD-10-CM | POA: Diagnosis not present

## 2023-10-26 DIAGNOSIS — Z992 Dependence on renal dialysis: Secondary | ICD-10-CM | POA: Diagnosis not present

## 2023-10-26 DIAGNOSIS — Y92524 Gas station as the place of occurrence of the external cause: Secondary | ICD-10-CM | POA: Diagnosis not present

## 2023-10-26 DIAGNOSIS — Z794 Long term (current) use of insulin: Secondary | ICD-10-CM | POA: Insufficient documentation

## 2023-10-26 DIAGNOSIS — E1169 Type 2 diabetes mellitus with other specified complication: Secondary | ICD-10-CM | POA: Insufficient documentation

## 2023-10-26 DIAGNOSIS — R262 Difficulty in walking, not elsewhere classified: Secondary | ICD-10-CM | POA: Diagnosis not present

## 2023-10-26 DIAGNOSIS — S72141D Displaced intertrochanteric fracture of right femur, subsequent encounter for closed fracture with routine healing: Secondary | ICD-10-CM | POA: Diagnosis not present

## 2023-10-26 DIAGNOSIS — F329 Major depressive disorder, single episode, unspecified: Secondary | ICD-10-CM | POA: Insufficient documentation

## 2023-10-26 DIAGNOSIS — S80811A Abrasion, right lower leg, initial encounter: Secondary | ICD-10-CM | POA: Diagnosis not present

## 2023-10-26 DIAGNOSIS — M24542 Contracture, left hand: Secondary | ICD-10-CM | POA: Diagnosis not present

## 2023-10-26 DIAGNOSIS — N139 Obstructive and reflux uropathy, unspecified: Secondary | ICD-10-CM | POA: Insufficient documentation

## 2023-10-26 DIAGNOSIS — D649 Anemia, unspecified: Secondary | ICD-10-CM | POA: Diagnosis not present

## 2023-10-26 DIAGNOSIS — E1151 Type 2 diabetes mellitus with diabetic peripheral angiopathy without gangrene: Secondary | ICD-10-CM | POA: Insufficient documentation

## 2023-10-26 DIAGNOSIS — E1122 Type 2 diabetes mellitus with diabetic chronic kidney disease: Secondary | ICD-10-CM | POA: Diagnosis not present

## 2023-10-26 DIAGNOSIS — D509 Iron deficiency anemia, unspecified: Secondary | ICD-10-CM | POA: Insufficient documentation

## 2023-10-26 DIAGNOSIS — M25561 Pain in right knee: Secondary | ICD-10-CM | POA: Diagnosis not present

## 2023-10-26 DIAGNOSIS — R278 Other lack of coordination: Secondary | ICD-10-CM | POA: Diagnosis not present

## 2023-10-26 DIAGNOSIS — R339 Retention of urine, unspecified: Secondary | ICD-10-CM | POA: Diagnosis not present

## 2023-10-26 DIAGNOSIS — Z89422 Acquired absence of other left toe(s): Secondary | ICD-10-CM | POA: Insufficient documentation

## 2023-10-26 DIAGNOSIS — L97521 Non-pressure chronic ulcer of other part of left foot limited to breakdown of skin: Secondary | ICD-10-CM | POA: Diagnosis not present

## 2023-10-26 DIAGNOSIS — M79672 Pain in left foot: Secondary | ICD-10-CM | POA: Diagnosis not present

## 2023-10-26 DIAGNOSIS — I120 Hypertensive chronic kidney disease with stage 5 chronic kidney disease or end stage renal disease: Secondary | ICD-10-CM | POA: Diagnosis not present

## 2023-10-26 DIAGNOSIS — I10 Essential (primary) hypertension: Secondary | ICD-10-CM | POA: Diagnosis not present

## 2023-10-26 DIAGNOSIS — I5030 Unspecified diastolic (congestive) heart failure: Secondary | ICD-10-CM | POA: Insufficient documentation

## 2023-10-26 DIAGNOSIS — Z89511 Acquired absence of right leg below knee: Secondary | ICD-10-CM | POA: Insufficient documentation

## 2023-10-26 DIAGNOSIS — I129 Hypertensive chronic kidney disease with stage 1 through stage 4 chronic kidney disease, or unspecified chronic kidney disease: Secondary | ICD-10-CM | POA: Diagnosis not present

## 2023-10-26 DIAGNOSIS — D631 Anemia in chronic kidney disease: Secondary | ICD-10-CM | POA: Diagnosis not present

## 2023-10-26 DIAGNOSIS — E118 Type 2 diabetes mellitus with unspecified complications: Secondary | ICD-10-CM | POA: Diagnosis not present

## 2023-10-26 DIAGNOSIS — N186 End stage renal disease: Secondary | ICD-10-CM | POA: Diagnosis not present

## 2023-10-26 DIAGNOSIS — N189 Chronic kidney disease, unspecified: Secondary | ICD-10-CM | POA: Diagnosis not present

## 2023-10-26 DIAGNOSIS — E1140 Type 2 diabetes mellitus with diabetic neuropathy, unspecified: Secondary | ICD-10-CM | POA: Insufficient documentation

## 2023-10-26 DIAGNOSIS — N2581 Secondary hyperparathyroidism of renal origin: Secondary | ICD-10-CM | POA: Insufficient documentation

## 2023-10-26 DIAGNOSIS — S72001D Fracture of unspecified part of neck of right femur, subsequent encounter for closed fracture with routine healing: Secondary | ICD-10-CM | POA: Diagnosis not present

## 2023-10-26 DIAGNOSIS — S72414D Nondisplaced unspecified condyle fracture of lower end of right femur, subsequent encounter for closed fracture with routine healing: Secondary | ICD-10-CM | POA: Diagnosis not present

## 2023-10-26 DIAGNOSIS — E1165 Type 2 diabetes mellitus with hyperglycemia: Secondary | ICD-10-CM | POA: Diagnosis not present

## 2023-10-26 DIAGNOSIS — K7689 Other specified diseases of liver: Secondary | ICD-10-CM | POA: Diagnosis not present

## 2023-10-26 DIAGNOSIS — R5381 Other malaise: Secondary | ICD-10-CM | POA: Diagnosis not present

## 2023-10-26 DIAGNOSIS — T8249XA Other complication of vascular dialysis catheter, initial encounter: Secondary | ICD-10-CM | POA: Diagnosis not present

## 2023-10-26 DIAGNOSIS — T7411XD Adult physical abuse, confirmed, subsequent encounter: Secondary | ICD-10-CM

## 2023-10-26 DIAGNOSIS — Z23 Encounter for immunization: Secondary | ICD-10-CM | POA: Diagnosis not present

## 2023-10-26 DIAGNOSIS — M7650 Patellar tendinitis, unspecified knee: Secondary | ICD-10-CM | POA: Diagnosis not present

## 2023-10-26 DIAGNOSIS — W1830XA Fall on same level, unspecified, initial encounter: Secondary | ICD-10-CM | POA: Diagnosis not present

## 2023-10-26 DIAGNOSIS — S63502D Unspecified sprain of left wrist, subsequent encounter: Secondary | ICD-10-CM | POA: Diagnosis not present

## 2023-10-26 DIAGNOSIS — R531 Weakness: Secondary | ICD-10-CM | POA: Diagnosis not present

## 2023-10-26 LAB — RENAL FUNCTION PANEL
Albumin: 2.3 g/dL — ABNORMAL LOW (ref 3.5–5.0)
Anion gap: 12 (ref 5–15)
BUN: 65 mg/dL — ABNORMAL HIGH (ref 6–20)
CO2: 23 mmol/L (ref 22–32)
Calcium: 7.8 mg/dL — ABNORMAL LOW (ref 8.9–10.3)
Chloride: 98 mmol/L (ref 98–111)
Creatinine, Ser: 9.01 mg/dL — ABNORMAL HIGH (ref 0.61–1.24)
GFR, Estimated: 7 mL/min — ABNORMAL LOW (ref >60)
Glucose, Bld: 168 mg/dL — ABNORMAL HIGH (ref 70–99)
Phosphorus: 6.4 mg/dL — ABNORMAL HIGH (ref 2.5–4.6)
Potassium: 5.6 mmol/L — ABNORMAL HIGH (ref 3.5–5.1)
Sodium: 133 mmol/L — ABNORMAL LOW (ref 135–145)

## 2023-10-26 LAB — CBC
HCT: 27 % — ABNORMAL LOW (ref 39.0–52.0)
Hemoglobin: 8.1 g/dL — ABNORMAL LOW (ref 13.0–17.0)
MCH: 29.2 pg (ref 26.0–34.0)
MCHC: 30 g/dL (ref 30.0–36.0)
MCV: 97.5 fL (ref 80.0–100.0)
Platelets: 183 10*3/uL (ref 150–400)
RBC: 2.77 MIL/uL — ABNORMAL LOW (ref 4.22–5.81)
RDW: 16.1 % — ABNORMAL HIGH (ref 11.5–15.5)
WBC: 11.6 10*3/uL — ABNORMAL HIGH (ref 4.0–10.5)
nRBC: 0 % (ref 0.0–0.2)

## 2023-10-26 LAB — GLUCOSE, CAPILLARY
Glucose-Capillary: 168 mg/dL — ABNORMAL HIGH (ref 70–99)
Glucose-Capillary: 176 mg/dL — ABNORMAL HIGH (ref 70–99)
Glucose-Capillary: 140 mg/dL — ABNORMAL HIGH (ref 70–99)

## 2023-10-26 MED ORDER — DILTIAZEM HCL ER COATED BEADS 300 MG PO CP24
300.0000 mg | ORAL_CAPSULE | Freq: Every day | ORAL | 0 refills | Status: DC
  Filled 2023-10-26: qty 30, 30d supply, fill #0

## 2023-10-26 MED ORDER — OXYCODONE HCL 10 MG PO TABS: 5.0000 mg | ORAL_TABLET | ORAL | 0 refills | Status: DC | PRN

## 2023-10-26 MED ORDER — ASPIRIN 325 MG PO TABS: 325.0000 mg | ORAL_TABLET | Freq: Every day | ORAL | 0 refills | Status: AC

## 2023-10-26 MED ORDER — ACETAMINOPHEN 500 MG PO TABS
1000.0000 mg | ORAL_TABLET | Freq: Four times a day (QID) | ORAL | 0 refills | Status: DC | PRN
  Filled 2023-10-26: qty 30, 4d supply, fill #0

## 2023-10-26 MED ORDER — POLYETHYLENE GLYCOL 3350 17 G PO PACK
34.0000 g | PACK | Freq: Three times a day (TID) | ORAL | 0 refills | Status: DC
  Filled 2023-10-26: qty 14, 3d supply, fill #0

## 2023-10-26 MED ORDER — SEVELAMER CARBONATE 800 MG PO TABS
1600.0000 mg | ORAL_TABLET | Freq: Three times a day (TID) | ORAL | 0 refills | Status: DC
  Filled 2023-10-26: qty 180, 30d supply, fill #0

## 2023-10-26 MED ORDER — FLUOXETINE HCL 20 MG PO CAPS
20.0000 mg | ORAL_CAPSULE | Freq: Every day | ORAL | 0 refills | Status: DC
  Filled 2023-10-26: qty 30, 30d supply, fill #0

## 2023-10-26 MED ORDER — DARBEPOETIN ALFA 150 MCG/0.3ML IJ SOSY: 150.0000 ug | PREFILLED_SYRINGE | INTRAMUSCULAR | Status: DC

## 2023-10-26 MED ORDER — SENNA 8.6 MG PO TABS
1.0000 | ORAL_TABLET | Freq: Two times a day (BID) | ORAL | 0 refills | Status: DC
  Filled 2023-10-26: qty 120, 60d supply, fill #0

## 2023-10-26 MED ORDER — HEPARIN SODIUM (PORCINE) 1000 UNIT/ML DIALYSIS
40.0000 [IU]/kg | INTRAMUSCULAR | Status: DC | PRN
  Administered 2023-10-26: 4300 [IU] via INTRAVENOUS_CENTRAL
  Filled 2023-10-26 (×3): qty 5

## 2023-10-26 MED ORDER — CALCITRIOL 0.25 MCG PO CAPS
0.2500 ug | ORAL_CAPSULE | ORAL | 0 refills | Status: DC
  Filled 2023-10-26: qty 30, 70d supply, fill #0

## 2023-10-26 MED ORDER — HYDRALAZINE HCL 50 MG PO TABS
50.0000 mg | ORAL_TABLET | Freq: Two times a day (BID) | ORAL | 0 refills | Status: DC
  Filled 2023-10-26: qty 30, 15d supply, fill #0

## 2023-10-26 MED ORDER — GABAPENTIN 300 MG PO CAPS
300.0000 mg | ORAL_CAPSULE | Freq: Every day | ORAL | 0 refills | Status: DC
  Filled 2023-10-26: qty 30, 30d supply, fill #0

## 2023-10-26 MED ORDER — BACLOFEN 10 MG PO TABS
5.0000 mg | ORAL_TABLET | Freq: Every day | ORAL | 0 refills | Status: DC
  Filled 2023-10-26: qty 30, 60d supply, fill #0

## 2023-10-26 MED ORDER — BACLOFEN 10 MG PO TABS
10.0000 mg | ORAL_TABLET | Freq: Every day | ORAL | 0 refills | Status: DC
  Filled 2023-10-26: qty 30, 30d supply, fill #0

## 2023-10-26 MED ORDER — ONDANSETRON HCL 4 MG/2ML IJ SOLN
4.0000 mg | Freq: Once | INTRAMUSCULAR | Status: AC
  Administered 2023-10-26: 4 mg via INTRAVENOUS
  Filled 2023-10-26: qty 2

## 2023-10-26 MED ORDER — TAMSULOSIN HCL 0.4 MG PO CAPS
0.4000 mg | ORAL_CAPSULE | Freq: Every day | ORAL | 0 refills | Status: DC
  Filled 2023-10-26: qty 30, 30d supply, fill #0

## 2023-10-26 MED ORDER — HUMALOG 100 UNIT/ML IJ SOLN: INTRAMUSCULAR | Status: AC

## 2023-10-26 MED ORDER — VITAMIN D (ERGOCALCIFEROL) 1.25 MG (50000 UNIT) PO CAPS: 50000.0000 [IU] | ORAL_CAPSULE | ORAL | 0 refills | Status: DC

## 2023-10-26 NOTE — TOC Progression Note (Signed)
Transition of Care [facility]) - Progression Note    Patient Details  Name: [name] [name] MRN: [id number] Date of Birth: [birth year]  Transition of Care [facility]) CM/SW Contact  Lorri Frederick, LCSW Phone Number: 10/26/2023, 3:28 PM  Clinical Narrative:    CSW confirmed with [facility] that they can accommodate HD time TTS 12noon.  Bjorn Loser initially stating need new auth, per Vesta Mixer date ended on 12/2 but good for 24 hours, Navi spoke with Bjorn Loser and Bjorn Loser now good to receive pt with existing auth.   Expected Discharge Plan: Skilled Nursing Facility Barriers to Discharge: Continued Medical Work up  Expected Discharge Plan and Services   Discharge Planning Services: CM Consult Post Acute Care Choice: Skilled Nursing Facility Living arrangements for the past 2 months: Single Family Home Expected Discharge Date: 10/26/23                                     Social Determinants of Health (SDOH) Interventions SDOH Screenings   Food Insecurity: No Food Insecurity (10/14/2023)  Housing: Medium Risk (10/14/2023)  Transportation Needs: No Transportation Needs (10/14/2023)  Utilities: Not At Risk (10/14/2023)  Alcohol Screen: Low Risk  (06/08/2023)  Depression (PHQ2-9): Medium Risk (10/12/2023)  Financial Resource Strain: High Risk (08/26/2023)  Physical Activity: Inactive (06/08/2023)  Social Connections: Socially Isolated (06/08/2023)  Stress: Stress Concern Present (08/26/2023)  Tobacco Use: High Risk (10/19/2023)  Health Literacy: Inadequate Health Literacy (06/08/2023)    Readmission Risk Interventions     No data to display

## 2023-10-26 NOTE — Assessment & Plan Note (Signed)
Hemoglobin stable.  Acute on chronic anemia during this admission suspected to be in the setting of anemia of chronic disease vs acute blood loss in the setting of fracture vs EPO deficiency in setting of progressed CKD. -Transfusion threshold <7 -Iron infusion on HD days x5 doses -Appreciate ongoing Nephro recs

## 2023-10-26 NOTE — Progress Notes (Addendum)
Pt has been accepted at [facility] on TTS 12:00 pm chair time. Pt can start on Thursday and will need to arrive at 11:00 am to complete paperwork prior to treatment. Met with pt at bedside while receiving HD. Discussed details and schedule letter provided. Pt agreeable to plan. Arrangements added to AVS as well. Update provided to attending, nephrologist, and CSW. Will assist as needed.   Olivia Canter Renal Navigator [phone]  Addendum at 3:31 pm: Pt will d/c today to snf. Contacted Berenice Primas to be advised that pt will d/c today and start on Thursday as planned. Contacted renal PA to request that orders be sent to clinic.

## 2023-10-26 NOTE — TOC Transition Note (Signed)
Transition of Care [facility]) - CM/SW Discharge Note   Patient Details  Name: [name] [name] MRN: [id number] Date of Birth: 03/04/1971  Transition of Care [facility]) CM/SW Contact:  Lorri Frederick, LCSW Phone Number: 10/26/2023, 3:44 PM   Clinical Narrative:   Pt discharging to [facility].  RN call report to [phone].   Pt is on will call with [facility] for transportation after HD.  Please call [phone] when pt  returns for [facility] to place pt in line for pickup.    Final next level of care: Skilled Nursing Facility Barriers to Discharge: Barriers Resolved   Patient Goals and CMS Choice CMS Medicare.gov Compare Post Acute Care list provided to:: Patient Choice offered to / list presented to : Patient  Discharge Placement                Patient [facility] bed at: [facility] Patient to be transferred to facility by: ptar Name of family member notified: left message with mother Burna Mortimer Patient and family notified of of transfer: 10/26/23  Discharge Plan and Services Additional resources added to the After Visit Summary for     Discharge Planning Services: CM Consult Post Acute Care Choice: Skilled Nursing Facility                               Social Determinants of Health (SDOH) Interventions SDOH Screenings   Food Insecurity: No Food Insecurity (10/14/2023)  Housing: Medium Risk (10/14/2023)  Transportation Needs: No Transportation Needs (10/14/2023)  Utilities: Not At Risk (10/14/2023)  Alcohol Screen: Low Risk  (06/08/2023)  Depression (PHQ2-9): Medium Risk (10/12/2023)  Financial Resource Strain: High Risk (08/26/2023)  Physical Activity: Inactive (06/08/2023)  Social Connections: Socially Isolated (06/08/2023)  Stress: Stress Concern Present (08/26/2023)  Tobacco Use: High Risk (10/19/2023)  Health Literacy: Inadequate Health Literacy (06/08/2023)     Readmission Risk Interventions     No data to display

## 2023-10-26 NOTE — Assessment & Plan Note (Signed)
Patient reports no real pain at hip, but is having knee pain still.  -Appreciate ongoing Ortho recs -Pain regimen: tylenol 1000mg  q6h daily,  oxycodone 5-10 Q4h PRN, oxycodone 10-15 Q4h PRN -Bowel regimen: Miralax BID, senna BID -Continue calcitriol 0.18mcg BID -PT/OT continue to treat- likely d/c to SNF tomorrow  -Fall precautions

## 2023-10-26 NOTE — Assessment & Plan Note (Signed)
-  Hepatitis B vaccine administered 11/28, will need a second shot in series in at least 1 month

## 2023-10-26 NOTE — Progress Notes (Addendum)
Patient ID: [name] [name], male   DOB: [birth year], 52 y.o.   MRN: [id number] [facility] Progress Note   Assessment/ Plan:   1.  Right intertrochanteric femur fracture: Secondary to fall and now status post intramedullary nail/open reduction and internal fixation on 10/15/2023.  Progressing slowly with physical therapy and he has been accepted to a skilled nursing facility. 2. ESRD: With progression from chronic kidney disease stage V/superimposed AKI now to needing dialysis during this hospitalization.  He is on schedule for hemodialysis today on a TTS schedule via right IJ [facility] (dialysis started 11/23); informs me that he has been accepted to [facility] over the.  He is status post left BBF on 11/26. 3. Anemia: Without overt blood loss and status post intravenous iron for low iron saturation seen previously on labs.  Continue ESA with titration. 4. CKD-MBD: Elevated phosphorus level noted for which sevelamer was adjusted.  Continue calcitriol for PTH control.  Calcium level at goal. 5. Nutrition: Currently on renal diet with fluid restriction.  He requests transition to regular diet to help improve his choices-potassium may be a limiting factor. 6. Hypertension: Blood pressures under reasonable control.  Monitor with ongoing medications/hemodialysis.  Subjective:   He has had a rough morning with nausea/vomiting.  Encouraged by possible discharge/disposition.   Objective:   BP 131/70 (BP Location: Right Arm)   Pulse 73   Temp 98 F (36.7 C) (Oral)   Resp 18   Ht 6\' 5"  (1.956 m)   Wt 107.2 kg Comment: bed  SpO2 97%   BMI 28.03 kg/m   Physical Exam: Gen: Appears uncomfortable resting in bed, just finished vomiting CVS: Pulse regular rhythm, normal rate, S1 and S2 normal.  Right IJ TDC Resp: Clear to auscultation bilaterally, no rales/rhonchi Abd: Soft, flat, nontender, bowel sounds normal Ext: Status post right BKA.  Left leg with venous stasis  changes/exfoliating skin.  Left BBF with palpable thrill  Labs: BMET Recent Labs  Lab 10/20/23 0607 10/21/23 0522 10/22/23 5621 10/22/23 1521 10/23/23 0347 10/23/23 0720 10/26/23 0639  NA 135 133* 135 136 135 135 133*  K 4.7 4.3 5.6* 5.1 5.1 5.0 5.6*  CL 99 100 97* 99 100 99 98  CO2 22 26 27 24 26 23 23   GLUCOSE 205* 140* 168* 156* 124* 129* 168*  BUN 69* 44* 62* 68* 69* 74* 65*  CREATININE 7.97* 5.83* 7.73* 7.82* 8.28* 8.68* 9.01*  CALCIUM 7.8* 7.7* 7.9* 8.1* 7.8* 7.9* 7.8*  PHOS 7.3* 5.7* 7.0*  --  7.3* 7.0* 6.4*   CBC Recent Labs  Lab 10/20/23 0607 10/22/23 1521 10/23/23 0347 10/23/23 0720  WBC 12.7* 9.3 9.1 8.9  HGB 8.0* 8.5* 8.0* 8.6*  HCT 25.8* 28.3* 26.3* 28.8*  MCV 90.5 93.4 92.6 95.0  PLT 152 181 166 180      Medications:     acetaminophen  1,000 mg Oral Q6H   aspirin  325 mg Oral Daily   atorvastatin  40 mg Oral QHS   baclofen  10 mg Oral Daily   calcitRIOL  0.25 mcg Oral Q T,Th,Sa-HD   carvedilol  12.5 mg Oral BID WC   Chlorhexidine Gluconate Cloth  6 each Topical Q0600   darbepoetin (ARANESP) injection - DIALYSIS  150 mcg Subcutaneous Q Sat-1800   diltiazem  300 mg Oral Daily   FLUoxetine  20 mg Oral Daily   gabapentin  300 mg Oral Daily   hydrALAZINE  50 mg Oral BID   influenza vac split  trivalent PF  0.5 mL Intramuscular Tomorrow-1000   insulin aspart  0-6 Units Subcutaneous TID WC   polyethylene glycol  34 g Oral TID   senna  1 tablet Oral BID   sevelamer carbonate  1,600 mg Oral TID WC   sodium chloride flush  3 mL Intravenous Q12H   tamsulosin  0.4 mg Oral QPC supper   Vitamin D (Ergocalciferol)  50,000 Units Oral Q7 days   Zetta Bills, MD 10/26/2023, 10:44 AM

## 2023-10-26 NOTE — Assessment & Plan Note (Signed)
Foley catheter in place. -Strict I&Os - Will need follow up with urology for void trial in 2-3 weeks

## 2023-10-26 NOTE — Progress Notes (Signed)
Nutrition Education Note follow up  RD previously consulted for Renal Education. Provided "Nutrition for Dialysis" 10/19/23 and at that time reviewed food groups and provided written recommended serving sizes specifically for protein portions and other food groups determined for patient's current nutritional status.   Met with patient at bedside while he was in dialysis.  Today he had a flat affect again and states he does not know if he has any questions on the meal plan.  Diet was a advanced by physician today 12/2 to regular.   Expect uncertain compliance at this time.  Body mass index is 27.97 kg/m.   Current diet order is regular, patient is consuming approximately 93% x 6 meals at this time. Labs and medications reviewed. Potassium 5.6.,  CBG 115-243, BUN 65, creatinine 9.01, phosphorus 6.4, GFR 7. Recommend resume renal diet.  If additional nutrition issues arise, please re-consult RD. Attach education resources to discharge instructions.   Alvino Chapel, RDLD Clinical Dietitian See AMION for contact information

## 2023-10-26 NOTE — Plan of Care (Signed)
Problem: Education: Goal: Ability to describe self-care measures that may prevent or decrease complications (Diabetes Survival Skills Education) will improve Outcome: Adequate for Discharge Goal: Individualized Educational Video(s) Outcome: Adequate for Discharge   Problem: Coping: Goal: Ability to adjust to condition or change in health will improve Outcome: Adequate for Discharge   Problem: Fluid Volume: Goal: Ability to maintain a balanced intake and output will improve Outcome: Adequate for Discharge   Problem: Health Behavior/Discharge Planning: Goal: Ability to identify and utilize available resources and services will improve Outcome: Adequate for Discharge Goal: Ability to manage health-related needs will improve Outcome: Adequate for Discharge   Problem: Metabolic: Goal: Ability to maintain appropriate glucose levels will improve Outcome: Adequate for Discharge   Problem: Nutritional: Goal: Maintenance of adequate nutrition will improve Outcome: Adequate for Discharge Goal: Progress toward achieving an optimal weight will improve Outcome: Adequate for Discharge   Problem: Skin Integrity: Goal: Risk for impaired skin integrity will decrease Outcome: Adequate for Discharge   Problem: Tissue Perfusion: Goal: Adequacy of tissue perfusion will improve Outcome: Adequate for Discharge   Problem: Education: Goal: Knowledge of General Education information will improve Description: Including pain rating scale, medication(s)/side effects and non-pharmacologic comfort measures Outcome: Adequate for Discharge   Problem: Health Behavior/Discharge Planning: Goal: Ability to manage health-related needs will improve Outcome: Adequate for Discharge   Problem: Clinical Measurements: Goal: Ability to maintain clinical measurements within normal limits will improve Outcome: Adequate for Discharge Goal: Will remain free from infection Outcome: Adequate for Discharge Goal:  Diagnostic test results will improve Outcome: Adequate for Discharge Goal: Respiratory complications will improve Outcome: Adequate for Discharge Goal: Cardiovascular complication will be avoided Outcome: Adequate for Discharge   Problem: Activity: Goal: Risk for activity intolerance will decrease Outcome: Adequate for Discharge   Problem: Nutrition: Goal: Adequate nutrition will be maintained Outcome: Adequate for Discharge   Problem: Coping: Goal: Level of anxiety will decrease Outcome: Adequate for Discharge   Problem: Elimination: Goal: Will not experience complications related to bowel motility Outcome: Adequate for Discharge Goal: Will not experience complications related to urinary retention Outcome: Adequate for Discharge   Problem: Pain Management: Goal: General experience of comfort will improve Outcome: Adequate for Discharge   Problem: Safety: Goal: Ability to remain free from injury will improve Outcome: Adequate for Discharge   Problem: Skin Integrity: Goal: Risk for impaired skin integrity will decrease Outcome: Adequate for Discharge   Problem: Education: Goal: Knowledge of disease and its progression will improve Outcome: Adequate for Discharge   Problem: Fluid Volume: Goal: Compliance with measures to maintain balanced fluid volume will improve Outcome: Adequate for Discharge   Problem: Health Behavior/Discharge Planning: Goal: Ability to manage health-related needs will improve Outcome: Adequate for Discharge   Problem: Education: Goal: Knowledge of General Education information will improve Description: Including pain rating scale, medication(s)/side effects and non-pharmacologic comfort measures Outcome: Adequate for Discharge   Problem: Health Behavior/Discharge Planning: Goal: Ability to manage health-related needs will improve Outcome: Adequate for Discharge   Problem: Clinical Measurements: Goal: Ability to maintain clinical  measurements within normal limits will improve Outcome: Adequate for Discharge Goal: Will remain free from infection Outcome: Adequate for Discharge Goal: Diagnostic test results will improve Outcome: Adequate for Discharge Goal: Respiratory complications will improve Outcome: Adequate for Discharge Goal: Cardiovascular complication will be avoided Outcome: Adequate for Discharge   Problem: Activity: Goal: Risk for activity intolerance will decrease Outcome: Adequate for Discharge   Problem: Nutrition: Goal: Adequate nutrition will be maintained Outcome: Adequate for  Discharge   Problem: Coping: Goal: Level of anxiety will decrease Outcome: Adequate for Discharge   Problem: Elimination: Goal: Will not experience complications related to bowel motility Outcome: Adequate for Discharge Goal: Will not experience complications related to urinary retention Outcome: Adequate for Discharge   Problem: Pain Management: Goal: General experience of comfort will improve Outcome: Adequate for Discharge   Problem: Safety: Goal: Ability to remain free from injury will improve Outcome: Adequate for Discharge   Problem: Skin Integrity: Goal: Risk for impaired skin integrity will decrease Outcome: Adequate for Discharge

## 2023-10-26 NOTE — Progress Notes (Signed)
Mobility Specialist: Progress Note   10/26/23 1208  Mobility  Activity Ambulated with assistance in room;Transferred to/from [facility]  Level of Assistance Contact guard assist, steadying assist  Assistive Device Front wheel walker  Distance Ambulated (ft) 20 ft  RLE Weight Bearing WBAT  Activity Response Tolerated well  Mobility Referral Yes  $Mobility charge 1 Mobility  Mobility Specialist Start Time (ACUTE ONLY) [id number]  Mobility Specialist Stop Time (ACUTE ONLY) 0935  Mobility Specialist Time Calculation (min) (ACUTE ONLY) 13 min    Pt was agreeable to mobility session - received in bed. C/o pain. MinG for ambulation. Requested to sit on [facility] for awhile. Left on [facility] with all needs met, call bell in reach. NT notified.   Maurene Capes Mobility Specialist Please contact via SecureChat or Rehab office at [phone]
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
## 2023-10-26 NOTE — Care Management Important Message (Signed)
Important Message  Patient Details  Name: [name] [name] MRN: [id number] Date of Birth: 03-13-1971   Important Message Given:  Yes - Medicare IM     Sherilyn Banker 10/26/2023, 2:59 PM

## 2023-10-26 NOTE — Progress Notes (Signed)
Daily Progress Note Intern Pager: [phone]  Patient name: [name] [name] Medical record number: [id number] Date of birth: 04-26-1971 Age: 52 y.o. Gender: male  Primary Care Provider: [name], MD Consultants: Ortho, nephrology, IR  Code Status: full   Pt Overview and Major Events to Date:  11/20 - Admitted to FMTS 11/22 - Surgical fixation of fracture, TDC placement per IR 11/23 - First HD session 11/26 - AV fistula placement  Assessment and Plan: [name] [name] is a 52 y.o. male with a pertinent PMH of frequent falls, CKD stage IV, MDD, R BKA, T2DM, and PAD who presented after fall with R intertrochanteric proximal femur fracture. Now s/p fracture fixation on 11/22 and TDC placement on 11/22, as well as AV fistula placement 11/26. Patient is stable for discharge.  Assessment & Plan Closed intertrochanteric fracture of right hip (HCC) Patient reports no real pain at hip, but is having knee pain still.  -Appreciate ongoing Ortho recs -Pain regimen: tylenol 1000mg  q6h daily,  oxycodone 5-10 Q4h PRN, oxycodone 10-15 Q4h PRN -Bowel regimen: Miralax BID, senna BID -Continue calcitriol 0.23mcg BID -PT/OT continue to treat- likely d/c to SNF tomorrow  -Fall precautions Acute kidney injury superimposed on CKD (HCC) On HD schedule TTS -Appreciate ongoing Nephrology recs -Avoid nephrotoxic agents -Strict I&Os -Continue calcitriol and Renvela per Nephro -RFP daily Anemia Hemoglobin stable.  Acute on chronic anemia during this admission suspected to be in the setting of anemia of chronic disease vs acute blood loss in the setting of fracture vs EPO deficiency in setting of progressed CKD. -Transfusion threshold <7 -Iron infusion on HD days x5 doses -Appreciate ongoing Nephro recs Controlled diabetes mellitus type 2 with complications (HCC) A1c 6.1%.  Has insulin pump at home where he gets around 12 units/day, but it is removed currently.   -Very sensitive SSI -CBGs  ACHS Urinary retention Foley catheter in place. -Strict I&Os - Will need follow up with urology for void trial in 2-3 weeks  Nonimmune to hepatitis B virus -Hepatitis B vaccine administered 11/28, will need a second shot in series in at least 1 month Right knee pain Still complaining of right knee pain. Discussed with Thyra Breed, PA with orthopaedics who did not think MRI was indicated at this time but agreed to see patient today.  - Appreciate ortho recs  - Pain reg as above  Throat pain  Patient complaining of throat pain, will obtain Strep swab.   Chronic and Stable Problems:  Hypertension: Continue home carvedilol 12.5 mg BID, diltiazem 300 mg daily. Decreased hydralazine 50 mg BID  HFpEF: Holding Lasix discontinue d/t HD  Nicotine dependence: Half pack a day, declines nicotine patch PAD: s/p right BKA, encourage nicotine cessation Neuropathy: Continue gabapentin 300 mg daily  Hyperlipidemia: Continue atorvastatin 400mg  daily MDD: Continue fluoxetine 10 mg daily   FEN/GI: Regular diet  PPx: heparin with dialysis  Dispo:SNF  pending dialysis set up .  Subjective:  Patient feeling better this morning. Has no acute concerns   Objective: Temp:  [98 F (36.7 C)-98.5 F (36.9 C)] 98.4 F (36.9 C) (12/03 1128) Pulse Rate:  [67-84] 69 (12/03 1215) Resp:  [3-29] 3 (12/03 1215) BP: (103-156)/(63-84) 122/63 (12/03 1215) SpO2:  [84 %-99 %] 95 % (12/03 1215) Weight:  [107 kg] 107 kg (12/03 1128) Physical Exam: General: Alert, nontoxic  Cardiovascular: RRR, no m/r/g Respiratory: CTAB, NWOB on RA Abdomen: soft, nontender, nondistended.  Extremities: TTP along right knee. No TTP at stump. Left leg  with big toe amputation, able to move left leg spontaneously.   Laboratory: Most recent CBC Lab Results  Component Value Date   WBC 8.9 10/23/2023   HGB 8.6 (L) 10/23/2023   HCT 28.8 (L) 10/23/2023   MCV 95.0 10/23/2023   PLT 180 10/23/2023   Most recent BMP    Latest Ref  Rng & Units 10/26/2023    6:39 AM  BMP  Glucose 70 - 99 mg/dL 027   BUN 6 - 20 mg/dL 65   Creatinine 2.53 - 1.24 mg/dL 6.64   Sodium 403 - 474 mmol/L 133   Potassium 3.5 - 5.1 mmol/L 5.6   Chloride 98 - 111 mmol/L 98   CO2 22 - 32 mmol/L 23   Calcium 8.9 - 10.3 mg/dL 7.8     Other pertinent labs: none   Imaging/Diagnostic Tests: No new imaging  Penne Lash, MD 10/26/2023, 12:18 PM  PGY-1,  Family Medicine FPTS Intern pager: [phone], text pages welcome Secure chat group [facility] Teaching Service

## 2023-10-26 NOTE — Plan of Care (Signed)
  Problem: Metabolic: Goal: Ability to maintain appropriate glucose levels will improve Outcome: Progressing   Problem: Nutritional: Goal: Maintenance of adequate nutrition will improve Outcome: Progressing   Problem: Skin Integrity: Goal: Risk for impaired skin integrity will decrease Outcome: Progressing   Problem: Health Behavior/Discharge Planning: Goal: Ability to manage health-related needs will improve Outcome: Progressing

## 2023-10-26 NOTE — Progress Notes (Signed)
Occupational Therapy Treatment Patient Details Name: [name] [name] MRN: [id number] DOB: September 15, 1971 Today's Date: 10/26/2023   History of present illness Pt is a 52 y/o male admitted on 10/13/23 after presenting with R intertrochanteric fracture from a fall outside of a gas station. Pt is s/p RLE fixation on 10/15/23.  Symptomatic orthostatic hypotension during PT sessions 11/27, 11/29. PMH: frequent falls, CKD IV, MDD, R BKA, DM2, PAD.   OT comments  Patient received in supine and agreeable to OT session. Patient able to get to EOB and perform bathing with supervision while seated for UB and upper legs to left ankle. Patient required max assist to bath left foot and donn shoe. Patient able to donn right prosthesis and stood from EOB with min assist for peri area bathing. Patient declined transfer to recliner and returned to supine. Patient will benefit from continued inpatient follow up therapy, <3 hours/day to further address bathing, dressing, and functional transfers. Acute OT to continue to follow.       If plan is discharge home, recommend the following:  Two people to help with walking and/or transfers;Two people to help with bathing/dressing/bathroom;Assistance with cooking/housework;Assist for transportation;Help with stairs or ramp for entrance   Equipment Recommendations  Other (comment) (defer to next venue of care)    Recommendations for Other Services      Precautions / Restrictions Precautions Precautions: Fall Precaution Comments: RLE prosthesis (in room), chronic R BKA Restrictions Weight Bearing Restrictions: Yes RLE Weight Bearing: Weight bearing as tolerated       Mobility Bed Mobility Overal bed mobility: Needs Assistance Bed Mobility: Supine to Sit, Sit to Supine     Supine to sit: Supervision, HOB elevated, Used rails Sit to supine: Supervision, Used rails, HOB elevated   General bed mobility comments: increased time and supervision     Transfers Overall transfer level: Needs assistance Equipment used: Rolling walker (2 wheels) Transfers: Sit to/from Stand Sit to Stand: Min assist, From elevated surface           General transfer comment: performed standing at EOB for peri area bathing and side stepping towards HOB     Balance Overall balance assessment: History of Falls, Needs assistance Sitting-balance support: Feet supported, No upper extremity supported Sitting balance-Leahy Scale: Fair Sitting balance - Comments: able to sit on EOB for self care without assistance for balance   Standing balance support: Bilateral upper extremity supported, During functional activity, Reliant on assistive device for balance Standing balance-Leahy Scale: Poor Standing balance comment: reliant on BUE support for standing with RW                           ADL either performed or assessed with clinical judgement   ADL Overall ADL's : Needs assistance/impaired     Grooming: Wash/dry hands;Wash/dry face;Oral care;Applying deodorant;Set up;Sitting Grooming Details (indicate cue type and reason): on EOB Upper Body Bathing: Set up;Sitting Upper Body Bathing Details (indicate cue type and reason): on EOB Lower Body Bathing: Moderate assistance;Sitting/lateral leans Lower Body Bathing Details (indicate cue type and reason): able to bathe UB legs down to ankle on LLE and required assistance with left foot and bottom when standing Upper Body Dressing : Set up;Sitting Upper Body Dressing Details (indicate cue type and reason): changed gown Lower Body Dressing: Moderate assistance;Sitting/lateral leans Lower Body Dressing Details (indicate cue type and reason): able to donn RLE prosthesis, required max assist for left shoe  General ADL Comments: self care performed seated on EOB, patient declining going to recliner    Extremity/Trunk Assessment              Vision       Perception      Praxis      Cognition Arousal: Alert Behavior During Therapy: Flat affect Overall Cognitive Status: No family/caregiver present to determine baseline cognitive functioning                                 General Comments: did not remember therapist from previous visit, cues to stay on task        Exercises      Shoulder Instructions       General Comments BP EOB 141/72(91), while standing 115/67 (83)    Pertinent Vitals/ Pain       Pain Assessment Pain Assessment: Faces Faces Pain Scale: Hurts little more Pain Location: Right hip and knee when standing Pain Descriptors / Indicators: Discomfort, Grimacing, Guarding Pain Intervention(s): Limited activity within patient's tolerance, Monitored during session, Repositioned  Home Living                                          Prior Functioning/Environment              Frequency  Min 1X/week        Progress Toward Goals  OT Goals(current goals can now be found in the care plan section)  Progress towards OT goals: Progressing toward goals  Acute Rehab OT Goals Patient Stated Goal: get better OT Goal Formulation: With patient Time For Goal Achievement: 10/30/23 Potential to Achieve Goals: Fair ADL Goals Pt Will Perform Upper Body Bathing: Independently;sitting Pt Will Perform Lower Body Bathing: with supervision;sitting/lateral leans Pt Will Transfer to Toilet: with contact guard assist;squat pivot transfer;bedside commode Additional ADL Goal #1: Pt will be able to sit at EOB for 8 mins to prepare for sitting in chair or WC.  Plan      Co-evaluation                 AM-PAC OT "6 Clicks" Daily Activity     Outcome Measure   Help from another person eating meals?: None Help from another person taking care of personal grooming?: A Little Help from another person toileting, which includes using toliet, bedpan, or urinal?: A Lot Help from another person bathing  (including washing, rinsing, drying)?: A Lot Help from another person to put on and taking off regular upper body clothing?: A Little Help from another person to put on and taking off regular lower body clothing?: A Lot 6 Click Score: 16    End of Session Equipment Utilized During Treatment: Rolling walker (2 wheels)  OT Visit Diagnosis: Unsteadiness on feet (R26.81);Other abnormalities of gait and mobility (R26.89);Repeated falls (R29.6);Muscle weakness (generalized) (M62.81);History of falling (Z91.81);Pain Pain - Right/Left: Right Pain - part of body: Hip   Activity Tolerance Patient tolerated treatment well   Patient Left in bed;with call bell/phone within reach;with bed alarm set   Nurse Communication Mobility status        Time: 1610-9604 OT Time Calculation (min): 28 min  Charges: OT General Charges $OT Visit: 1 Visit OT Treatments $Self Care/Home Management : 23-37 mins  Alfonse Flavors, OTA Acute Rehabilitation Services  Office [phone]  Dewain Penning 10/26/2023, 9:23 AM

## 2023-10-26 NOTE — Discharge Planning (Signed)
[facility]  Initial Hemodialysis Orders  Dialysis center: [facility]  Patient's name: [name] [name] DOB: Apr 02, 1971 AKI or ESRD: ESRD  Discharge diagnosis: R intertrochanteric femur fracture s/p intramedullary nail/ORIF 2.  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 Barry Brunner    In Center Medications: Heparin Dose: 2000 unit bolus  Type: porcine VDRA: Calcitriol 0.68mcg q HD Venofer: 500mg  IV iron given during admission. New order per protocol. Mircera: q2wks Next dose due: 12/7 Sensipar: per protocol  Discharge labs: Hgb: 8.1 K+: 5.6  Ca: 7.8  Phos: 6.4 Alb: 2.3  Please draw routine labs. Additional labs needed:  Additional notes/follow-up:

## 2023-10-26 NOTE — Assessment & Plan Note (Signed)
A1c 6.1%.  Has insulin pump at home where he gets around 12 units/day, but it is removed currently.   -Very sensitive SSI -CBGs ACHS

## 2023-10-26 NOTE — Assessment & Plan Note (Signed)
>>  ASSESSMENT AND PLAN FOR T2DM (TYPE 2 DIABETES MELLITUS) (HCC) WRITTEN ON 10/26/2023 12:23 PM BY BALOCH, MAHNOOR, MD  A1c 6.1%.  Has insulin  pump at home where he gets around 12 units/day, but it is removed currently.   -Very sensitive SSI -CBGs ACHS

## 2023-10-26 NOTE — Assessment & Plan Note (Signed)
Still complaining of right knee pain. Discussed with Thyra Breed, PA with orthopaedics who did not think MRI was indicated at this time but agreed to see patient today.  - Appreciate ortho recs  - Pain reg as above  Throat pain  Patient complaining of throat pain, will obtain Strep swab.

## 2023-10-26 NOTE — Assessment & Plan Note (Signed)
On HD schedule TTS -Appreciate ongoing Nephrology recs -Avoid nephrotoxic agents -Strict I&Os -Continue calcitriol and Renvela per Nephro -RFP daily

## 2023-10-27 ENCOUNTER — Telehealth: Payer: Self-pay | Admitting: Nephrology

## 2023-10-27 DIAGNOSIS — R296 Repeated falls: Secondary | ICD-10-CM | POA: Diagnosis not present

## 2023-10-27 DIAGNOSIS — T782XXA Anaphylactic shock, unspecified, initial encounter: Secondary | ICD-10-CM | POA: Insufficient documentation

## 2023-10-27 DIAGNOSIS — N186 End stage renal disease: Secondary | ICD-10-CM | POA: Diagnosis not present

## 2023-10-27 DIAGNOSIS — R5381 Other malaise: Secondary | ICD-10-CM | POA: Diagnosis not present

## 2023-10-27 DIAGNOSIS — I10 Essential (primary) hypertension: Secondary | ICD-10-CM | POA: Diagnosis not present

## 2023-10-27 NOTE — Telephone Encounter (Signed)
Transition of Care Contact from Inpatient Facility   Date of Discharge: 10/26/23 Date of Contact: 10/27/23 Method of contact: phone Talked to patient   Patient contacted to discuss transition of care form recent hospitaliztion. Patient was admitted to [facility] from 11/20 to 10/26/23 with the discharge diagnosis of R intertrochanteric proximal femur fracture s/p ORIF.     Medication changes were reviewed.  Patient will follow up with is outpatient dialysis center 10/28/23.  Other follow up needs include none identified.    Virgina Norfolk, PA-C [employer]

## 2023-10-28 DIAGNOSIS — N186 End stage renal disease: Secondary | ICD-10-CM | POA: Diagnosis not present

## 2023-10-28 DIAGNOSIS — D631 Anemia in chronic kidney disease: Secondary | ICD-10-CM | POA: Diagnosis not present

## 2023-10-28 DIAGNOSIS — Z992 Dependence on renal dialysis: Secondary | ICD-10-CM | POA: Diagnosis not present

## 2023-10-28 DIAGNOSIS — T8249XA Other complication of vascular dialysis catheter, initial encounter: Secondary | ICD-10-CM | POA: Diagnosis not present

## 2023-10-28 DIAGNOSIS — Z23 Encounter for immunization: Secondary | ICD-10-CM | POA: Diagnosis not present

## 2023-10-28 DIAGNOSIS — D689 Coagulation defect, unspecified: Secondary | ICD-10-CM | POA: Diagnosis not present

## 2023-10-28 DIAGNOSIS — N2581 Secondary hyperparathyroidism of renal origin: Secondary | ICD-10-CM | POA: Diagnosis not present

## 2023-10-28 NOTE — Anesthesia Postprocedure Evaluation (Signed)
Anesthesia Post Note  Patient: [name] [name]  Procedure(s) Performed: LEFT BRACHIOCEPHALIC ARTERIOVENOUS (AV) FISTULA CREATION (Left: Arm Upper)     Patient location during evaluation: PACU Anesthesia Type: General Level of consciousness: awake and alert Pain management: pain level controlled Vital Signs Assessment: post-procedure vital signs reviewed and stable Respiratory status: spontaneous breathing, nonlabored ventilation and respiratory function stable Cardiovascular status: blood pressure returned to baseline and stable Postop Assessment: no apparent nausea or vomiting Anesthetic complications: no       Dream Harman

## 2023-10-29 DIAGNOSIS — R296 Repeated falls: Secondary | ICD-10-CM | POA: Diagnosis not present

## 2023-10-29 DIAGNOSIS — N186 End stage renal disease: Secondary | ICD-10-CM | POA: Diagnosis not present

## 2023-10-29 DIAGNOSIS — I10 Essential (primary) hypertension: Secondary | ICD-10-CM | POA: Diagnosis not present

## 2023-10-29 DIAGNOSIS — E46 Unspecified protein-calorie malnutrition: Secondary | ICD-10-CM | POA: Insufficient documentation

## 2023-10-29 DIAGNOSIS — R5381 Other malaise: Secondary | ICD-10-CM | POA: Diagnosis not present

## 2023-10-30 DIAGNOSIS — N2581 Secondary hyperparathyroidism of renal origin: Secondary | ICD-10-CM | POA: Diagnosis not present

## 2023-10-30 DIAGNOSIS — Z23 Encounter for immunization: Secondary | ICD-10-CM | POA: Diagnosis not present

## 2023-10-30 DIAGNOSIS — N186 End stage renal disease: Secondary | ICD-10-CM | POA: Diagnosis not present

## 2023-10-30 DIAGNOSIS — D649 Anemia, unspecified: Secondary | ICD-10-CM | POA: Diagnosis not present

## 2023-10-30 DIAGNOSIS — D689 Coagulation defect, unspecified: Secondary | ICD-10-CM | POA: Diagnosis not present

## 2023-10-30 DIAGNOSIS — D631 Anemia in chronic kidney disease: Secondary | ICD-10-CM | POA: Diagnosis not present

## 2023-10-30 DIAGNOSIS — Z992 Dependence on renal dialysis: Secondary | ICD-10-CM | POA: Diagnosis not present

## 2023-10-30 DIAGNOSIS — T8249XA Other complication of vascular dialysis catheter, initial encounter: Secondary | ICD-10-CM | POA: Diagnosis not present

## 2023-10-31 DIAGNOSIS — S72414D Nondisplaced unspecified condyle fracture of lower end of right femur, subsequent encounter for closed fracture with routine healing: Secondary | ICD-10-CM | POA: Diagnosis not present

## 2023-11-01 ENCOUNTER — Encounter: Payer: Self-pay | Admitting: Family Medicine

## 2023-11-01 ENCOUNTER — Ambulatory Visit: Payer: 59 | Admitting: Family Medicine

## 2023-11-01 VITALS — BP 112/60 | HR 66 | Ht 77.0 in

## 2023-11-01 DIAGNOSIS — S72141D Displaced intertrochanteric fracture of right femur, subsequent encounter for closed fracture with routine healing: Secondary | ICD-10-CM

## 2023-11-01 DIAGNOSIS — R339 Retention of urine, unspecified: Secondary | ICD-10-CM | POA: Diagnosis not present

## 2023-11-01 DIAGNOSIS — N186 End stage renal disease: Secondary | ICD-10-CM | POA: Diagnosis not present

## 2023-11-01 DIAGNOSIS — Z794 Long term (current) use of insulin: Secondary | ICD-10-CM | POA: Diagnosis not present

## 2023-11-01 DIAGNOSIS — E118 Type 2 diabetes mellitus with unspecified complications: Secondary | ICD-10-CM

## 2023-11-01 DIAGNOSIS — I10 Essential (primary) hypertension: Secondary | ICD-10-CM

## 2023-11-01 DIAGNOSIS — K7689 Other specified diseases of liver: Secondary | ICD-10-CM

## 2023-11-01 NOTE — Patient Instructions (Signed)
It was wonderful to see you today! Thank you for choosing [facility].   Please bring ALL of your medications with you to every visit.   Today we talked about:  Please consider changing from oxycodone to Vicodin for pain management as patient feels it is more effective for his pain control.  Please help him schedule follow-up with Ortho trauma (Dr. Jena Gauss) for follow-up on wound and possible MRI. Patient needs continue [facility] management with [facility], please help him schedule follow-up in the next couple of weeks for Foley management. Patient needs to continue taking aspirin 325 mg daily for DVT prophylaxis after his orthopedic surgery. Please continue to check his blood sugars and give insulin as appropriate.  I will not recheck his CBC or kidney function today as he follows with nephrology for dialysis management. His blood pressure is stable today so please continue his blood pressure management as previously prescribed. Patient will need outpatient cirrhosis workup we will hold at this time pending his other medical conditions.  Please follow up in 2-3 weeks  Call the clinic at [phone] if your symptoms worsen or you have any concerns.  Please be sure to schedule follow up at the front desk before you leave today.   Elberta Fortis, DO Family Medicine

## 2023-11-01 NOTE — Progress Notes (Unsigned)
    SUBJECTIVE:   CHIEF COMPLAINT / HPI:   Hospital f/u Admitted from 11/28 to 12/3 in [facility] for right femur fracture.  Underwent surgical fixation on 11/22 and placed on aspirin x 1 month.  Required transfusion of 2 units for acute on chronic anemia.  Placed Foley catheter due to urinary retention, needs continued outpatient urologic Foley care  PCP Follow-up Recommendations: Needs Orthopedic f/u the week of 12/9 Consider evaluation for cirrhosis given some subtle Nodularity of the Liver Found on CTAP Plan for f/u 5-6 week with duplex of fistula with Vascular Surgery Hep B nonimmune, needs second booster vaccine in series at least 1 month from first dose 11/28 BP - decreased home hydralazine to 50mg  BID due to orthostatic hypotension Urology referral for urinary retention, will need to schedule appointment. Continue aspirin for 35 days Monitor CBG and restart insulin as appropriate Monitor anemia, recommend age appropriate evaluation  Knee pian 9/10. Pain limits rehab. Giving Oxy 10mg  and Tylenol. Dilaudid works pretty well inpatient. Would like to try Vicodan for pain.   Foley in place. Needs to see Uro. Rehab is changing the bag.   Not taking aspirin, advise to get back on.   BP stable today. About the same as usual.   Rehab checking CBG and giving insulin.   Needs appointment with Ortho trauma Dr. Jena Gauss. Will likely do MRI.   PERTINENT  PMH / PSH: ESRD on HD, HTN, T2DM, tobacco use, right BKA  OBJECTIVE:   There were no vitals taken for this visit. ***  General: NAD, pleasant, able to participate in exam Cardiac: RRR, no murmurs. Respiratory: CTAB, normal effort, No wheezes, rales or rhonchi Abdomen: Bowel sounds present, nontender, nondistended Extremities: no edema or cyanosis. Skin: warm and dry, no rashes noted Neuro: alert, no obvious focal deficits Psych: Normal affect and mood  ASSESSMENT/PLAN:   No problem-specific Assessment & Plan notes  found for this encounter.     Dr. Elberta Fortis, DO [facility]    {    This will disappear when note is signed, click to select method of visit    :1}

## 2023-11-02 ENCOUNTER — Ambulatory Visit: Payer: 59 | Admitting: Family Medicine

## 2023-11-02 DIAGNOSIS — D631 Anemia in chronic kidney disease: Secondary | ICD-10-CM | POA: Diagnosis not present

## 2023-11-02 DIAGNOSIS — D689 Coagulation defect, unspecified: Secondary | ICD-10-CM | POA: Diagnosis not present

## 2023-11-02 DIAGNOSIS — N186 End stage renal disease: Secondary | ICD-10-CM | POA: Diagnosis not present

## 2023-11-02 DIAGNOSIS — Z992 Dependence on renal dialysis: Secondary | ICD-10-CM | POA: Diagnosis not present

## 2023-11-02 DIAGNOSIS — Z23 Encounter for immunization: Secondary | ICD-10-CM | POA: Diagnosis not present

## 2023-11-02 DIAGNOSIS — T8249XA Other complication of vascular dialysis catheter, initial encounter: Secondary | ICD-10-CM | POA: Diagnosis not present

## 2023-11-02 DIAGNOSIS — N2581 Secondary hyperparathyroidism of renal origin: Secondary | ICD-10-CM | POA: Diagnosis not present

## 2023-11-02 NOTE — Assessment & Plan Note (Signed)
Controlled on exam today, continue current therapy and will not adjust hydralazine dose at this time.

## 2023-11-02 NOTE — Assessment & Plan Note (Signed)
>>  ASSESSMENT AND PLAN FOR T2DM (TYPE 2 DIABETES MELLITUS) (HCC) WRITTEN ON 11/02/2023  3:51 PM BY THEOPHILUS PAGAN, MD  Having CBGs checked at SNF, giving insulin  as needed.  Continue current regimen and will likely need further adjustment when leaves SNF.

## 2023-11-02 NOTE — Assessment & Plan Note (Signed)
Underwent surgical fixation on 11/22, unaware of follow-up with Ortho trauma (Dr. Jena Gauss).  Per last orthopedic note on 12/2, planning to see patient in the clinic for wound check and repeat x-rays 2 weeks after discharge.  Continues to have persistent pain in his right knee, they will likely consider MRI at that time per their last note.  Pain management with oxycodone during rehab, patient prefers Vicodin if possible. -Recommend SNF support getting patient to orthopedic follow-up -Recommend SNF consider changing from oxycodone to Vicodin for better pain relief per patient request -Not taking ASA 325 mg per his report, advised importance to continue for 35 days for DVT prophylaxis

## 2023-11-02 NOTE — Assessment & Plan Note (Signed)
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 Needs Urology follow up for chronic management. -Recommend appointment with [facility]

## 2023-11-02 NOTE — Assessment & Plan Note (Signed)
Having CBGs checked at SNF, giving insulin as needed.  Continue current regimen and will likely need further adjustment when leaves SNF.

## 2023-11-03 ENCOUNTER — Encounter (HOSPITAL_COMMUNITY): Payer: 59

## 2023-11-03 DIAGNOSIS — R5381 Other malaise: Secondary | ICD-10-CM | POA: Diagnosis not present

## 2023-11-03 DIAGNOSIS — R296 Repeated falls: Secondary | ICD-10-CM | POA: Diagnosis not present

## 2023-11-03 DIAGNOSIS — N186 End stage renal disease: Secondary | ICD-10-CM | POA: Diagnosis not present

## 2023-11-03 DIAGNOSIS — D649 Anemia, unspecified: Secondary | ICD-10-CM | POA: Diagnosis not present

## 2023-11-03 DIAGNOSIS — I10 Essential (primary) hypertension: Secondary | ICD-10-CM | POA: Diagnosis not present

## 2023-11-03 DIAGNOSIS — E1165 Type 2 diabetes mellitus with hyperglycemia: Secondary | ICD-10-CM | POA: Diagnosis not present

## 2023-11-03 DIAGNOSIS — M24542 Contracture, left hand: Secondary | ICD-10-CM | POA: Diagnosis not present

## 2023-11-03 DIAGNOSIS — E118 Type 2 diabetes mellitus with unspecified complications: Secondary | ICD-10-CM | POA: Diagnosis not present

## 2023-11-04 ENCOUNTER — Encounter (HOSPITAL_COMMUNITY): Payer: 59

## 2023-11-04 DIAGNOSIS — N186 End stage renal disease: Secondary | ICD-10-CM | POA: Diagnosis not present

## 2023-11-04 DIAGNOSIS — N2581 Secondary hyperparathyroidism of renal origin: Secondary | ICD-10-CM | POA: Diagnosis not present

## 2023-11-04 DIAGNOSIS — D631 Anemia in chronic kidney disease: Secondary | ICD-10-CM | POA: Diagnosis not present

## 2023-11-04 DIAGNOSIS — Z992 Dependence on renal dialysis: Secondary | ICD-10-CM | POA: Diagnosis not present

## 2023-11-04 DIAGNOSIS — D689 Coagulation defect, unspecified: Secondary | ICD-10-CM | POA: Diagnosis not present

## 2023-11-04 DIAGNOSIS — Z23 Encounter for immunization: Secondary | ICD-10-CM | POA: Diagnosis not present

## 2023-11-04 DIAGNOSIS — T8249XA Other complication of vascular dialysis catheter, initial encounter: Secondary | ICD-10-CM | POA: Diagnosis not present

## 2023-11-05 DIAGNOSIS — N186 End stage renal disease: Secondary | ICD-10-CM | POA: Diagnosis not present

## 2023-11-05 DIAGNOSIS — R5381 Other malaise: Secondary | ICD-10-CM | POA: Diagnosis not present

## 2023-11-05 DIAGNOSIS — I10 Essential (primary) hypertension: Secondary | ICD-10-CM | POA: Diagnosis not present

## 2023-11-05 DIAGNOSIS — R296 Repeated falls: Secondary | ICD-10-CM | POA: Diagnosis not present

## 2023-11-06 DIAGNOSIS — T8249XA Other complication of vascular dialysis catheter, initial encounter: Secondary | ICD-10-CM | POA: Diagnosis not present

## 2023-11-06 DIAGNOSIS — N2581 Secondary hyperparathyroidism of renal origin: Secondary | ICD-10-CM | POA: Diagnosis not present

## 2023-11-06 DIAGNOSIS — Z992 Dependence on renal dialysis: Secondary | ICD-10-CM | POA: Diagnosis not present

## 2023-11-06 DIAGNOSIS — D689 Coagulation defect, unspecified: Secondary | ICD-10-CM | POA: Diagnosis not present

## 2023-11-06 DIAGNOSIS — Z23 Encounter for immunization: Secondary | ICD-10-CM | POA: Diagnosis not present

## 2023-11-06 DIAGNOSIS — D631 Anemia in chronic kidney disease: Secondary | ICD-10-CM | POA: Diagnosis not present

## 2023-11-06 DIAGNOSIS — N186 End stage renal disease: Secondary | ICD-10-CM | POA: Diagnosis not present

## 2023-11-08 DIAGNOSIS — M25561 Pain in right knee: Secondary | ICD-10-CM | POA: Diagnosis not present

## 2023-11-08 DIAGNOSIS — R5381 Other malaise: Secondary | ICD-10-CM | POA: Diagnosis not present

## 2023-11-08 DIAGNOSIS — R296 Repeated falls: Secondary | ICD-10-CM | POA: Diagnosis not present

## 2023-11-08 DIAGNOSIS — S72141D Displaced intertrochanteric fracture of right femur, subsequent encounter for closed fracture with routine healing: Secondary | ICD-10-CM | POA: Diagnosis not present

## 2023-11-08 DIAGNOSIS — I10 Essential (primary) hypertension: Secondary | ICD-10-CM | POA: Diagnosis not present

## 2023-11-08 DIAGNOSIS — N186 End stage renal disease: Secondary | ICD-10-CM | POA: Diagnosis not present

## 2023-11-09 ENCOUNTER — Other Ambulatory Visit: Payer: Self-pay | Admitting: Student

## 2023-11-09 ENCOUNTER — Other Ambulatory Visit: Payer: Self-pay | Admitting: *Deleted

## 2023-11-09 DIAGNOSIS — S8990XA Unspecified injury of unspecified lower leg, initial encounter: Secondary | ICD-10-CM

## 2023-11-09 DIAGNOSIS — N186 End stage renal disease: Secondary | ICD-10-CM | POA: Diagnosis not present

## 2023-11-09 DIAGNOSIS — N185 Chronic kidney disease, stage 5: Secondary | ICD-10-CM

## 2023-11-09 DIAGNOSIS — D631 Anemia in chronic kidney disease: Secondary | ICD-10-CM | POA: Diagnosis not present

## 2023-11-09 DIAGNOSIS — R296 Repeated falls: Secondary | ICD-10-CM | POA: Diagnosis not present

## 2023-11-09 DIAGNOSIS — D689 Coagulation defect, unspecified: Secondary | ICD-10-CM | POA: Diagnosis not present

## 2023-11-09 DIAGNOSIS — S83249A Other tear of medial meniscus, current injury, unspecified knee, initial encounter: Secondary | ICD-10-CM

## 2023-11-09 DIAGNOSIS — R5381 Other malaise: Secondary | ICD-10-CM | POA: Diagnosis not present

## 2023-11-09 DIAGNOSIS — Z23 Encounter for immunization: Secondary | ICD-10-CM | POA: Diagnosis not present

## 2023-11-09 DIAGNOSIS — I10 Essential (primary) hypertension: Secondary | ICD-10-CM | POA: Diagnosis not present

## 2023-11-09 DIAGNOSIS — T8249XA Other complication of vascular dialysis catheter, initial encounter: Secondary | ICD-10-CM | POA: Diagnosis not present

## 2023-11-09 DIAGNOSIS — N2581 Secondary hyperparathyroidism of renal origin: Secondary | ICD-10-CM | POA: Diagnosis not present

## 2023-11-09 DIAGNOSIS — Z992 Dependence on renal dialysis: Secondary | ICD-10-CM | POA: Diagnosis not present

## 2023-11-10 ENCOUNTER — Other Ambulatory Visit: Payer: Self-pay | Admitting: Student

## 2023-11-10 DIAGNOSIS — I10 Essential (primary) hypertension: Secondary | ICD-10-CM | POA: Diagnosis not present

## 2023-11-10 DIAGNOSIS — N186 End stage renal disease: Secondary | ICD-10-CM | POA: Diagnosis not present

## 2023-11-10 DIAGNOSIS — F321 Major depressive disorder, single episode, moderate: Secondary | ICD-10-CM

## 2023-11-10 DIAGNOSIS — R296 Repeated falls: Secondary | ICD-10-CM | POA: Diagnosis not present

## 2023-11-10 DIAGNOSIS — R5381 Other malaise: Secondary | ICD-10-CM | POA: Diagnosis not present

## 2023-11-11 DIAGNOSIS — D631 Anemia in chronic kidney disease: Secondary | ICD-10-CM | POA: Diagnosis not present

## 2023-11-11 DIAGNOSIS — Z23 Encounter for immunization: Secondary | ICD-10-CM | POA: Diagnosis not present

## 2023-11-11 DIAGNOSIS — T8249XA Other complication of vascular dialysis catheter, initial encounter: Secondary | ICD-10-CM | POA: Diagnosis not present

## 2023-11-11 DIAGNOSIS — D689 Coagulation defect, unspecified: Secondary | ICD-10-CM | POA: Diagnosis not present

## 2023-11-11 DIAGNOSIS — N2581 Secondary hyperparathyroidism of renal origin: Secondary | ICD-10-CM | POA: Diagnosis not present

## 2023-11-11 DIAGNOSIS — N186 End stage renal disease: Secondary | ICD-10-CM | POA: Diagnosis not present

## 2023-11-11 DIAGNOSIS — Z992 Dependence on renal dialysis: Secondary | ICD-10-CM | POA: Diagnosis not present

## 2023-11-12 DIAGNOSIS — I10 Essential (primary) hypertension: Secondary | ICD-10-CM | POA: Diagnosis not present

## 2023-11-12 DIAGNOSIS — R296 Repeated falls: Secondary | ICD-10-CM | POA: Diagnosis not present

## 2023-11-12 DIAGNOSIS — R5381 Other malaise: Secondary | ICD-10-CM | POA: Diagnosis not present

## 2023-11-12 DIAGNOSIS — N186 End stage renal disease: Secondary | ICD-10-CM | POA: Diagnosis not present

## 2023-11-13 DIAGNOSIS — T8249XA Other complication of vascular dialysis catheter, initial encounter: Secondary | ICD-10-CM | POA: Diagnosis not present

## 2023-11-13 DIAGNOSIS — N186 End stage renal disease: Secondary | ICD-10-CM | POA: Diagnosis not present

## 2023-11-13 DIAGNOSIS — Z23 Encounter for immunization: Secondary | ICD-10-CM | POA: Diagnosis not present

## 2023-11-13 DIAGNOSIS — D689 Coagulation defect, unspecified: Secondary | ICD-10-CM | POA: Diagnosis not present

## 2023-11-13 DIAGNOSIS — Z992 Dependence on renal dialysis: Secondary | ICD-10-CM | POA: Diagnosis not present

## 2023-11-13 DIAGNOSIS — N2581 Secondary hyperparathyroidism of renal origin: Secondary | ICD-10-CM | POA: Diagnosis not present

## 2023-11-13 DIAGNOSIS — D631 Anemia in chronic kidney disease: Secondary | ICD-10-CM | POA: Diagnosis not present

## 2023-11-14 DIAGNOSIS — R296 Repeated falls: Secondary | ICD-10-CM | POA: Diagnosis not present

## 2023-11-14 DIAGNOSIS — N186 End stage renal disease: Secondary | ICD-10-CM | POA: Diagnosis not present

## 2023-11-14 DIAGNOSIS — R5381 Other malaise: Secondary | ICD-10-CM | POA: Diagnosis not present

## 2023-11-14 DIAGNOSIS — I10 Essential (primary) hypertension: Secondary | ICD-10-CM | POA: Diagnosis not present

## 2023-11-16 DIAGNOSIS — R296 Repeated falls: Secondary | ICD-10-CM | POA: Diagnosis not present

## 2023-11-16 DIAGNOSIS — I10 Essential (primary) hypertension: Secondary | ICD-10-CM | POA: Diagnosis not present

## 2023-11-16 DIAGNOSIS — D689 Coagulation defect, unspecified: Secondary | ICD-10-CM | POA: Diagnosis not present

## 2023-11-16 DIAGNOSIS — N2581 Secondary hyperparathyroidism of renal origin: Secondary | ICD-10-CM | POA: Diagnosis not present

## 2023-11-16 DIAGNOSIS — R5381 Other malaise: Secondary | ICD-10-CM | POA: Diagnosis not present

## 2023-11-16 DIAGNOSIS — Z992 Dependence on renal dialysis: Secondary | ICD-10-CM | POA: Diagnosis not present

## 2023-11-16 DIAGNOSIS — T8249XA Other complication of vascular dialysis catheter, initial encounter: Secondary | ICD-10-CM | POA: Diagnosis not present

## 2023-11-16 DIAGNOSIS — D631 Anemia in chronic kidney disease: Secondary | ICD-10-CM | POA: Diagnosis not present

## 2023-11-16 DIAGNOSIS — Z23 Encounter for immunization: Secondary | ICD-10-CM | POA: Diagnosis not present

## 2023-11-16 DIAGNOSIS — N186 End stage renal disease: Secondary | ICD-10-CM | POA: Diagnosis not present

## 2023-11-18 DIAGNOSIS — Z992 Dependence on renal dialysis: Secondary | ICD-10-CM | POA: Diagnosis not present

## 2023-11-18 DIAGNOSIS — E118 Type 2 diabetes mellitus with unspecified complications: Secondary | ICD-10-CM | POA: Diagnosis not present

## 2023-11-18 DIAGNOSIS — T8249XA Other complication of vascular dialysis catheter, initial encounter: Secondary | ICD-10-CM | POA: Diagnosis not present

## 2023-11-18 DIAGNOSIS — N2581 Secondary hyperparathyroidism of renal origin: Secondary | ICD-10-CM | POA: Diagnosis not present

## 2023-11-18 DIAGNOSIS — Z23 Encounter for immunization: Secondary | ICD-10-CM | POA: Diagnosis not present

## 2023-11-18 DIAGNOSIS — N186 End stage renal disease: Secondary | ICD-10-CM | POA: Diagnosis not present

## 2023-11-18 DIAGNOSIS — D689 Coagulation defect, unspecified: Secondary | ICD-10-CM | POA: Diagnosis not present

## 2023-11-18 DIAGNOSIS — D631 Anemia in chronic kidney disease: Secondary | ICD-10-CM | POA: Diagnosis not present

## 2023-11-18 DIAGNOSIS — Z794 Long term (current) use of insulin: Secondary | ICD-10-CM | POA: Diagnosis not present

## 2023-11-19 ENCOUNTER — Ambulatory Visit (HOSPITAL_COMMUNITY)
Admission: RE | Admit: 2023-11-19 | Discharge: 2023-11-19 | Disposition: A | Discharge status: Home / Self Care | Payer: 59 | Source: Ambulatory Visit | Attending: Vascular Surgery | Admitting: Vascular Surgery

## 2023-11-19 ENCOUNTER — Encounter: Payer: Self-pay | Admitting: Physician Assistant

## 2023-11-19 ENCOUNTER — Ambulatory Visit (INDEPENDENT_AMBULATORY_CARE_PROVIDER_SITE_OTHER): Payer: 59 | Admitting: Physician Assistant

## 2023-11-19 VITALS — BP 161/80 | HR 77 | Temp 97.8°F

## 2023-11-19 DIAGNOSIS — R296 Repeated falls: Secondary | ICD-10-CM | POA: Diagnosis not present

## 2023-11-19 DIAGNOSIS — N186 End stage renal disease: Secondary | ICD-10-CM

## 2023-11-19 DIAGNOSIS — R5381 Other malaise: Secondary | ICD-10-CM | POA: Diagnosis not present

## 2023-11-19 DIAGNOSIS — N185 Chronic kidney disease, stage 5: Secondary | ICD-10-CM | POA: Insufficient documentation

## 2023-11-19 DIAGNOSIS — Z992 Dependence on renal dialysis: Secondary | ICD-10-CM

## 2023-11-19 DIAGNOSIS — I10 Essential (primary) hypertension: Secondary | ICD-10-CM | POA: Diagnosis not present

## 2023-11-19 NOTE — Progress Notes (Signed)
POST OPERATIVE OFFICE NOTE    CC:  F/u for surgery  HPI:  This is a 52 y.o. male who is s/p left BC AV fistula on 10/19/23 by Dr. Hetty Blend.  He is on HD via [facility] placed by IR 10/15/23.    Pt returns today for follow up.  Pt states he has no pain, or new loss of motor since his fistula was placed. He currently resides in a SNF for rehab after breaking his right hip due to a fall.     Allergies  Allergen Reactions   Iron Nausea And Vomiting    With the iron tablets   Tape Rash    Rash at site of tape--okay to use paper tape    Current Outpatient Medications  Medication Sig Dispense Refill   acetaminophen (TYLENOL) 500 MG tablet Take 2 tablets (1,000 mg total) by mouth every 6 (six) hours as needed. 30 tablet 0   AGAMATRIX ULTRA-THIN LANCETS MISC Check blood sugars before meals twice daily 100 each 11   atorvastatin (LIPITOR) 40 MG tablet Take 1 tablet (40 mg total) by mouth at bedtime. (Patient taking differently: Take 40 mg by mouth daily.) 90 tablet 0   baclofen (LIORESAL) 10 MG tablet Take 0.5 tablets (5 mg total) by mouth daily. 30 each 0   Blood Glucose Monitoring Suppl (ACCU-CHEK GUIDE ME) w/Device KIT See admin instructions.     calcitRIOL (ROCALTROL) 0.25 MCG capsule Take 1 capsule (0.25 mcg total) by mouth Every Tuesday,Thursday,and Saturday with dialysis. 30 capsule 0   carvedilol (COREG) 12.5 MG tablet Take 12.5 mg by mouth 2 (two) times daily with a meal.     Darbepoetin Alfa (ARANESP) 150 MCG/0.3ML SOSY injection Inject 0.3 mLs (150 mcg total) into the skin every Saturday at 6 PM.     diltiazem (CARDIZEM CD) 300 MG 24 hr capsule Take 1 capsule (300 mg total) by mouth daily. 30 capsule 0   FLUoxetine (PROZAC) 20 MG capsule Take 1 capsule (20 mg total) by mouth daily. 30 capsule 0   gabapentin (NEURONTIN) 300 MG capsule Take 1 capsule (300 mg total) by mouth daily. 30 capsule 0   glucose blood (AGAMATRIX PRESTO TEST) test strip Check blood sugars twice daily before meals 100  each 12   glucose blood test strip 1 each by Other route as needed for other. Use as instructed     hydrALAZINE (APRESOLINE) 50 MG tablet Take 1 tablet (50 mg total) by mouth 2 (two) times daily. 30 tablet 0   Insulin Disposable Pump (OMNIPOD DASH PODS, GEN 4,) MISC      insulin lispro (HUMALOG) 100 UNIT/ML injection 0-6 Units, Subcutaneous, 3 times daily with meals, First dose on Sun 10/17/23 at 0800 Correction coverage: Very Sensitive (ESRD/Dialysis) CBG < 70: Implement Hypoglycemia Standing Orders and refer to Hypoglycemia Standing Orders sidebar report CBG 70 - 120: 0 units CBG 121 - 150: 0 units CBG 151 - 200: 1 unit CBG 201-250: 2 units CBG 251-300: 3 units CBG 301-350: 4 units CBG 351-400: 5 units CBG > 400: Give 6 units and call MD     ondansetron (ZOFRAN-ODT) 8 MG disintegrating tablet DISSOLVE 1 TABLET(8 MG) ON THE TONGUE EVERY 8 HOURS AS NEEDED FOR NAUSEA OR VOMITING 20 tablet 0   Oxycodone HCl 10 MG TABS Take 0.5-1 tablets (5-10 mg total) by mouth every 4 (four) hours as needed. 30 tablet 0   polyethylene glycol (MIRALAX / GLYCOLAX) 17 g packet Mix as directed and take 34 g  by mouth 3 (three) times daily. 14 each 0   senna (SENOKOT) 8.6 MG TABS tablet Take 1 tablet (8.6 mg total) by mouth 2 (two) times daily. 120 tablet 0   sevelamer carbonate (RENVELA) 800 MG tablet Take 2 tablets (1,600 mg total) by mouth 3 (three) times daily with meals. 180 tablet 0   tamsulosin (FLOMAX) 0.4 MG CAPS capsule Take 1 capsule (0.4 mg total) by mouth daily after supper. 30 capsule 0   Vitamin D, Ergocalciferol, (DRISDOL) 1.25 MG (50000 UNIT) CAPS capsule Take 1 capsule (50,000 Units total) by mouth every 7 (seven) days. 5 capsule 0   No current facility-administered medications for this visit.     ROS:  See HPI  Physical Exam:  Findings:  +--------------------+----------+-----------------+--------+  AVF                PSV (cm/s)Flow Vol (mL/min)Comments   +--------------------+----------+-----------------+--------+  Native artery inflow   182           679                 +--------------------+----------+-----------------+--------+  AVF Anastomosis        315                               +--------------------+----------+-----------------+--------+     +------------+----------+-------------+----------+-------------------+  OUTFLOW VEINPSV (cm/s)Diameter (cm)Depth (cm)     Describe        +------------+----------+-------------+----------+-------------------+  Shoulder       93        0.41        0.77   Patent, no stenosis  +------------+----------+-------------+----------+-------------------+  Prox UA         62        0.56        0.73   Patent, no stenosis  +------------+----------+-------------+----------+-------------------+  Mid UA          63        0.65        0.50   Patent, no stenosis  +------------+----------+-------------+----------+-------------------+  Dist UA        185        0.78        0.39   Patent, no stenosis  +------------+----------+-------------+----------+-------------------+  AC Fossa       178        1.06        0.29   Patent, no stenosis  +------------+----------+-------------+----------+-------------------+     Summary   - Patent left brachial-cephalic AVF. No evidence of stenosis.  - Inflow Volume Flow: 679 mL/min.   Incision:  incision has healed well left AC, left hand contracture with decreased function.   Extremities:  palpable distal thrill and radial pulses Neuro: sensation decreased , motor decreased.      Assessment/Plan:  This is a 52 y.o. male who is s/p:left UE BC AV fistula.  The flow rate is > 600, the maturity is developing with proximal fistula < 0.6 still and the proximal depth is too deep for access. The fistula is patent.   I will have him squeeze a ball I gave him to help with maturing the fistula.  He will f/u in 4 weeks with repeat fistula  duplex.     Mosetta Pigeon PA-C [facility] [phone]   Call MD:  Lenell Antu

## 2023-11-20 DIAGNOSIS — D689 Coagulation defect, unspecified: Secondary | ICD-10-CM | POA: Diagnosis not present

## 2023-11-20 DIAGNOSIS — Z23 Encounter for immunization: Secondary | ICD-10-CM | POA: Diagnosis not present

## 2023-11-20 DIAGNOSIS — N2581 Secondary hyperparathyroidism of renal origin: Secondary | ICD-10-CM | POA: Diagnosis not present

## 2023-11-20 DIAGNOSIS — Z992 Dependence on renal dialysis: Secondary | ICD-10-CM | POA: Diagnosis not present

## 2023-11-20 DIAGNOSIS — D631 Anemia in chronic kidney disease: Secondary | ICD-10-CM | POA: Diagnosis not present

## 2023-11-20 DIAGNOSIS — T8249XA Other complication of vascular dialysis catheter, initial encounter: Secondary | ICD-10-CM | POA: Diagnosis not present

## 2023-11-20 DIAGNOSIS — N186 End stage renal disease: Secondary | ICD-10-CM | POA: Diagnosis not present

## 2023-11-21 DIAGNOSIS — R5381 Other malaise: Secondary | ICD-10-CM | POA: Diagnosis not present

## 2023-11-21 DIAGNOSIS — R296 Repeated falls: Secondary | ICD-10-CM | POA: Diagnosis not present

## 2023-11-21 DIAGNOSIS — I10 Essential (primary) hypertension: Secondary | ICD-10-CM | POA: Diagnosis not present

## 2023-11-21 DIAGNOSIS — N186 End stage renal disease: Secondary | ICD-10-CM | POA: Diagnosis not present

## 2023-11-22 DIAGNOSIS — N2581 Secondary hyperparathyroidism of renal origin: Secondary | ICD-10-CM | POA: Diagnosis not present

## 2023-11-22 DIAGNOSIS — T8249XA Other complication of vascular dialysis catheter, initial encounter: Secondary | ICD-10-CM | POA: Diagnosis not present

## 2023-11-22 DIAGNOSIS — R296 Repeated falls: Secondary | ICD-10-CM | POA: Diagnosis not present

## 2023-11-22 DIAGNOSIS — N186 End stage renal disease: Secondary | ICD-10-CM | POA: Diagnosis not present

## 2023-11-22 DIAGNOSIS — D631 Anemia in chronic kidney disease: Secondary | ICD-10-CM | POA: Diagnosis not present

## 2023-11-22 DIAGNOSIS — Z23 Encounter for immunization: Secondary | ICD-10-CM | POA: Diagnosis not present

## 2023-11-22 DIAGNOSIS — Z992 Dependence on renal dialysis: Secondary | ICD-10-CM | POA: Diagnosis not present

## 2023-11-22 DIAGNOSIS — R5381 Other malaise: Secondary | ICD-10-CM | POA: Diagnosis not present

## 2023-11-22 DIAGNOSIS — I10 Essential (primary) hypertension: Secondary | ICD-10-CM | POA: Diagnosis not present

## 2023-11-22 DIAGNOSIS — D689 Coagulation defect, unspecified: Secondary | ICD-10-CM | POA: Diagnosis not present

## 2023-11-23 DIAGNOSIS — R5381 Other malaise: Secondary | ICD-10-CM | POA: Diagnosis not present

## 2023-11-23 DIAGNOSIS — S80811A Abrasion, right lower leg, initial encounter: Secondary | ICD-10-CM | POA: Diagnosis not present

## 2023-11-23 DIAGNOSIS — N186 End stage renal disease: Secondary | ICD-10-CM | POA: Diagnosis not present

## 2023-11-23 DIAGNOSIS — R296 Repeated falls: Secondary | ICD-10-CM | POA: Diagnosis not present

## 2023-11-23 DIAGNOSIS — D649 Anemia, unspecified: Secondary | ICD-10-CM | POA: Diagnosis not present

## 2023-11-23 DIAGNOSIS — I129 Hypertensive chronic kidney disease with stage 1 through stage 4 chronic kidney disease, or unspecified chronic kidney disease: Secondary | ICD-10-CM | POA: Diagnosis not present

## 2023-11-23 DIAGNOSIS — I10 Essential (primary) hypertension: Secondary | ICD-10-CM | POA: Diagnosis not present

## 2023-11-23 DIAGNOSIS — E118 Type 2 diabetes mellitus with unspecified complications: Secondary | ICD-10-CM | POA: Diagnosis not present

## 2023-11-23 DIAGNOSIS — E1165 Type 2 diabetes mellitus with hyperglycemia: Secondary | ICD-10-CM | POA: Diagnosis not present

## 2023-11-23 DIAGNOSIS — Z992 Dependence on renal dialysis: Secondary | ICD-10-CM | POA: Diagnosis not present

## 2023-11-24 DIAGNOSIS — N179 Acute kidney failure, unspecified: Secondary | ICD-10-CM | POA: Diagnosis not present

## 2023-11-24 DIAGNOSIS — R262 Difficulty in walking, not elsewhere classified: Secondary | ICD-10-CM | POA: Diagnosis not present

## 2023-11-24 DIAGNOSIS — S72141D Displaced intertrochanteric fracture of right femur, subsequent encounter for closed fracture with routine healing: Secondary | ICD-10-CM | POA: Diagnosis not present

## 2023-11-24 DIAGNOSIS — M6702 Short Achilles tendon (acquired), left ankle: Secondary | ICD-10-CM | POA: Diagnosis not present

## 2023-11-24 DIAGNOSIS — N186 End stage renal disease: Secondary | ICD-10-CM | POA: Diagnosis not present

## 2023-11-24 DIAGNOSIS — R339 Retention of urine, unspecified: Secondary | ICD-10-CM | POA: Diagnosis not present

## 2023-11-24 DIAGNOSIS — N189 Chronic kidney disease, unspecified: Secondary | ICD-10-CM | POA: Diagnosis not present

## 2023-11-24 DIAGNOSIS — E1165 Type 2 diabetes mellitus with hyperglycemia: Secondary | ICD-10-CM | POA: Diagnosis not present

## 2023-11-24 DIAGNOSIS — Z789 Other specified health status: Secondary | ICD-10-CM | POA: Diagnosis not present

## 2023-11-24 DIAGNOSIS — M24542 Contracture, left hand: Secondary | ICD-10-CM | POA: Diagnosis not present

## 2023-11-24 DIAGNOSIS — Z794 Long term (current) use of insulin: Secondary | ICD-10-CM | POA: Diagnosis not present

## 2023-11-24 DIAGNOSIS — Z89511 Acquired absence of right leg below knee: Secondary | ICD-10-CM | POA: Diagnosis not present

## 2023-11-24 DIAGNOSIS — I10 Essential (primary) hypertension: Secondary | ICD-10-CM | POA: Diagnosis not present

## 2023-11-24 DIAGNOSIS — E118 Type 2 diabetes mellitus with unspecified complications: Secondary | ICD-10-CM | POA: Diagnosis not present

## 2023-11-24 DIAGNOSIS — R278 Other lack of coordination: Secondary | ICD-10-CM | POA: Diagnosis not present

## 2023-11-24 DIAGNOSIS — N185 Chronic kidney disease, stage 5: Secondary | ICD-10-CM | POA: Diagnosis not present

## 2023-11-24 DIAGNOSIS — L97521 Non-pressure chronic ulcer of other part of left foot limited to breakdown of skin: Secondary | ICD-10-CM | POA: Diagnosis not present

## 2023-11-24 DIAGNOSIS — D649 Anemia, unspecified: Secondary | ICD-10-CM | POA: Diagnosis not present

## 2023-11-24 DIAGNOSIS — R5381 Other malaise: Secondary | ICD-10-CM | POA: Diagnosis not present

## 2023-11-24 DIAGNOSIS — M25561 Pain in right knee: Secondary | ICD-10-CM | POA: Diagnosis not present

## 2023-11-24 DIAGNOSIS — S63502D Unspecified sprain of left wrist, subsequent encounter: Secondary | ICD-10-CM | POA: Diagnosis not present

## 2023-11-24 DIAGNOSIS — M79672 Pain in left foot: Secondary | ICD-10-CM | POA: Diagnosis not present

## 2023-11-24 DIAGNOSIS — S72001D Fracture of unspecified part of neck of right femur, subsequent encounter for closed fracture with routine healing: Secondary | ICD-10-CM | POA: Diagnosis not present

## 2023-11-24 DIAGNOSIS — K2289 Other specified disease of esophagus: Secondary | ICD-10-CM | POA: Diagnosis not present

## 2023-11-24 DIAGNOSIS — R296 Repeated falls: Secondary | ICD-10-CM | POA: Diagnosis not present

## 2023-11-24 DIAGNOSIS — M7650 Patellar tendinitis, unspecified knee: Secondary | ICD-10-CM | POA: Diagnosis not present

## 2023-11-24 DIAGNOSIS — S72414D Nondisplaced unspecified condyle fracture of lower end of right femur, subsequent encounter for closed fracture with routine healing: Secondary | ICD-10-CM | POA: Diagnosis not present

## 2023-11-25 DIAGNOSIS — D631 Anemia in chronic kidney disease: Secondary | ICD-10-CM | POA: Diagnosis not present

## 2023-11-25 DIAGNOSIS — D689 Coagulation defect, unspecified: Secondary | ICD-10-CM | POA: Diagnosis not present

## 2023-11-25 DIAGNOSIS — E1122 Type 2 diabetes mellitus with diabetic chronic kidney disease: Secondary | ICD-10-CM | POA: Diagnosis not present

## 2023-11-25 DIAGNOSIS — D509 Iron deficiency anemia, unspecified: Secondary | ICD-10-CM | POA: Diagnosis not present

## 2023-11-25 DIAGNOSIS — Z23 Encounter for immunization: Secondary | ICD-10-CM | POA: Diagnosis not present

## 2023-11-25 DIAGNOSIS — I10 Essential (primary) hypertension: Secondary | ICD-10-CM | POA: Diagnosis not present

## 2023-11-25 DIAGNOSIS — N2581 Secondary hyperparathyroidism of renal origin: Secondary | ICD-10-CM | POA: Diagnosis not present

## 2023-11-25 DIAGNOSIS — R296 Repeated falls: Secondary | ICD-10-CM | POA: Diagnosis not present

## 2023-11-25 DIAGNOSIS — T8249XA Other complication of vascular dialysis catheter, initial encounter: Secondary | ICD-10-CM | POA: Diagnosis not present

## 2023-11-25 DIAGNOSIS — R5381 Other malaise: Secondary | ICD-10-CM | POA: Diagnosis not present

## 2023-11-25 DIAGNOSIS — N186 End stage renal disease: Secondary | ICD-10-CM | POA: Diagnosis not present

## 2023-11-25 DIAGNOSIS — Z992 Dependence on renal dialysis: Secondary | ICD-10-CM | POA: Diagnosis not present

## 2023-11-26 ENCOUNTER — Ambulatory Visit
Admission: RE | Admit: 2023-11-26 | Discharge: 2023-11-26 | Disposition: A | Discharge status: Home / Self Care | Payer: 59 | Source: Ambulatory Visit | Attending: Student

## 2023-11-26 DIAGNOSIS — N179 Acute kidney failure, unspecified: Secondary | ICD-10-CM | POA: Diagnosis not present

## 2023-11-26 DIAGNOSIS — S72001D Fracture of unspecified part of neck of right femur, subsequent encounter for closed fracture with routine healing: Secondary | ICD-10-CM | POA: Diagnosis not present

## 2023-11-26 DIAGNOSIS — Z89511 Acquired absence of right leg below knee: Secondary | ICD-10-CM | POA: Diagnosis not present

## 2023-11-26 DIAGNOSIS — R600 Localized edema: Secondary | ICD-10-CM | POA: Diagnosis not present

## 2023-11-26 DIAGNOSIS — S72414D Nondisplaced unspecified condyle fracture of lower end of right femur, subsequent encounter for closed fracture with routine healing: Secondary | ICD-10-CM | POA: Diagnosis not present

## 2023-11-26 DIAGNOSIS — N189 Chronic kidney disease, unspecified: Secondary | ICD-10-CM | POA: Diagnosis not present

## 2023-11-26 DIAGNOSIS — S72141D Displaced intertrochanteric fracture of right femur, subsequent encounter for closed fracture with routine healing: Secondary | ICD-10-CM | POA: Diagnosis not present

## 2023-11-26 DIAGNOSIS — M25561 Pain in right knee: Secondary | ICD-10-CM | POA: Diagnosis not present

## 2023-11-26 DIAGNOSIS — R262 Difficulty in walking, not elsewhere classified: Secondary | ICD-10-CM | POA: Diagnosis not present

## 2023-11-26 DIAGNOSIS — R278 Other lack of coordination: Secondary | ICD-10-CM | POA: Diagnosis not present

## 2023-11-26 DIAGNOSIS — S83249A Other tear of medial meniscus, current injury, unspecified knee, initial encounter: Secondary | ICD-10-CM

## 2023-11-26 DIAGNOSIS — E118 Type 2 diabetes mellitus with unspecified complications: Secondary | ICD-10-CM | POA: Diagnosis not present

## 2023-11-26 DIAGNOSIS — M6702 Short Achilles tendon (acquired), left ankle: Secondary | ICD-10-CM | POA: Diagnosis not present

## 2023-11-26 DIAGNOSIS — E1165 Type 2 diabetes mellitus with hyperglycemia: Secondary | ICD-10-CM | POA: Diagnosis not present

## 2023-11-27 DIAGNOSIS — D631 Anemia in chronic kidney disease: Secondary | ICD-10-CM | POA: Diagnosis not present

## 2023-11-27 DIAGNOSIS — N2581 Secondary hyperparathyroidism of renal origin: Secondary | ICD-10-CM | POA: Diagnosis not present

## 2023-11-27 DIAGNOSIS — E1122 Type 2 diabetes mellitus with diabetic chronic kidney disease: Secondary | ICD-10-CM | POA: Diagnosis not present

## 2023-11-27 DIAGNOSIS — Z992 Dependence on renal dialysis: Secondary | ICD-10-CM | POA: Diagnosis not present

## 2023-11-27 DIAGNOSIS — N186 End stage renal disease: Secondary | ICD-10-CM | POA: Diagnosis not present

## 2023-11-27 DIAGNOSIS — D689 Coagulation defect, unspecified: Secondary | ICD-10-CM | POA: Diagnosis not present

## 2023-11-27 DIAGNOSIS — Z23 Encounter for immunization: Secondary | ICD-10-CM | POA: Diagnosis not present

## 2023-11-27 DIAGNOSIS — T8249XA Other complication of vascular dialysis catheter, initial encounter: Secondary | ICD-10-CM | POA: Diagnosis not present

## 2023-11-27 DIAGNOSIS — D509 Iron deficiency anemia, unspecified: Secondary | ICD-10-CM | POA: Diagnosis not present

## 2023-11-29 ENCOUNTER — Encounter: Payer: Self-pay | Admitting: Student

## 2023-11-29 ENCOUNTER — Ambulatory Visit (INDEPENDENT_AMBULATORY_CARE_PROVIDER_SITE_OTHER): Payer: 59 | Admitting: Student

## 2023-11-29 VITALS — BP 155/70 | HR 73 | Ht 77.0 in

## 2023-11-29 DIAGNOSIS — M25561 Pain in right knee: Secondary | ICD-10-CM | POA: Diagnosis not present

## 2023-11-29 DIAGNOSIS — G8929 Other chronic pain: Secondary | ICD-10-CM

## 2023-11-29 MED ORDER — LIDOCAINE 4 % EX PTCH: 1.0000 | MEDICATED_PATCH | CUTANEOUS | 0 refills | Status: DC

## 2023-11-29 MED ORDER — DICLOFENAC SODIUM 1 % EX GEL: 4.0000 g | Freq: Four times a day (QID) | CUTANEOUS | 0 refills | Status: DC

## 2023-11-29 NOTE — Assessment & Plan Note (Signed)
 Pain since fall in November.  Orthopedics is working this up currently.  No red flag symptoms that would make me think of a septic joint. Discussed will not continue oxycodone  as it is not helping any of his symptoms. -Orthopedic follow-up -Follow-up MRI read -Discussed with patient to get blessing from nephrology about possibly starting NSAIDs as patient is already ESRD on dialysis -Lidocaine  patches -Voltaren  gel -Ace wrap bandage placed on prior to being

## 2023-11-29 NOTE — Progress Notes (Signed)
    SUBJECTIVE:   CHIEF COMPLAINT / HPI:   Presents with severe right knee pain that has been progressively worsening since November. The pain is so severe that he has had to resort to using a walker for mobility. He describes a popping sensation with every step. The onset of the pain was after a fall on his hip and leg. He has been on oxycodone  10 mg 4 times daily for pain management since his hospitalization in November but reports no relief from the medication. He also reports having received a cortisone injection about three to four weeks ago, which also did not provide significant relief. An MRI was obtained by the orthopedic doctors but read is pending.  PERTINENT  PMH / PSH: T2DM, HTN, ESRD, History of hip fracture   OBJECTIVE:   BP (!) 155/70   Pulse 73   Ht 6' 5 (1.956 m)   SpO2 99%   BMI 27.45 kg/m   General: NAD, awake, alert, responsive to questions Head: Normocephalic atraumatic Respiratory:chest rises symmetrically,  no increased work of breathing Extremities: R BKA well appearing, some mild erythema overlying patella without warmth, no swelling present, mildly tender over patella and medial joint line, no ligament laxity on exam  ASSESSMENT/PLAN:   Assessment & Plan Chronic pain of right knee Pain since fall in November.  Orthopedics is working this up currently.  No red flag symptoms that would make me think of a septic joint. Discussed will not continue oxycodone  as it is not helping any of his symptoms. -Orthopedic follow-up -Follow-up MRI read -Discussed with patient to get blessing from nephrology about possibly starting NSAIDs as patient is already ESRD on dialysis -Lidocaine  patches -Voltaren  gel -Ace wrap bandage placed on prior to being   Wendel Lesch, MD [facility]

## 2023-11-29 NOTE — Patient Instructions (Addendum)
 It was great to see you! Thank you for allowing me to participate in your care!   Our plans for today:  - Please take voltaren  gel and lidocaine  patches - Please compress with ace bandage - Please follow up with orthopedics, we cannot prescribe additional opiates here - I can place referral fro physical medicine and rehab - we can repeat cortisone injection 3 months if needed   Take care and seek immediate care sooner if you develop any concerns.  Wendel Lesch, MD

## 2023-11-30 ENCOUNTER — Observation Stay (HOSPITAL_COMMUNITY)
Admission: EM | Admit: 2023-11-30 | Discharge: 2023-12-03 | Disposition: A | Discharge status: Home Health | Payer: 59 | Attending: Family Medicine | Admitting: Family Medicine

## 2023-11-30 ENCOUNTER — Emergency Department (HOSPITAL_COMMUNITY): Payer: 59

## 2023-11-30 ENCOUNTER — Other Ambulatory Visit: Payer: Self-pay

## 2023-11-30 ENCOUNTER — Telehealth: Payer: Self-pay

## 2023-11-30 ENCOUNTER — Encounter (HOSPITAL_COMMUNITY): Payer: Self-pay | Admitting: *Deleted

## 2023-11-30 DIAGNOSIS — G929 Unspecified toxic encephalopathy: Secondary | ICD-10-CM | POA: Insufficient documentation

## 2023-11-30 DIAGNOSIS — E1140 Type 2 diabetes mellitus with diabetic neuropathy, unspecified: Secondary | ICD-10-CM | POA: Insufficient documentation

## 2023-11-30 DIAGNOSIS — K5730 Diverticulosis of large intestine without perforation or abscess without bleeding: Secondary | ICD-10-CM | POA: Diagnosis not present

## 2023-11-30 DIAGNOSIS — T50904A Poisoning by unspecified drugs, medicaments and biological substances, undetermined, initial encounter: Secondary | ICD-10-CM | POA: Diagnosis not present

## 2023-11-30 DIAGNOSIS — I959 Hypotension, unspecified: Principal | ICD-10-CM

## 2023-11-30 DIAGNOSIS — N186 End stage renal disease: Secondary | ICD-10-CM

## 2023-11-30 DIAGNOSIS — E1122 Type 2 diabetes mellitus with diabetic chronic kidney disease: Secondary | ICD-10-CM | POA: Diagnosis not present

## 2023-11-30 DIAGNOSIS — K8020 Calculus of gallbladder without cholecystitis without obstruction: Secondary | ICD-10-CM | POA: Diagnosis not present

## 2023-11-30 DIAGNOSIS — Z992 Dependence on renal dialysis: Secondary | ICD-10-CM | POA: Diagnosis not present

## 2023-11-30 DIAGNOSIS — G9340 Encephalopathy, unspecified: Secondary | ICD-10-CM | POA: Diagnosis not present

## 2023-11-30 DIAGNOSIS — I132 Hypertensive heart and chronic kidney disease with heart failure and with stage 5 chronic kidney disease, or end stage renal disease: Secondary | ICD-10-CM | POA: Diagnosis not present

## 2023-11-30 DIAGNOSIS — I5030 Unspecified diastolic (congestive) heart failure: Secondary | ICD-10-CM | POA: Diagnosis not present

## 2023-11-30 DIAGNOSIS — Z794 Long term (current) use of insulin: Secondary | ICD-10-CM | POA: Diagnosis not present

## 2023-11-30 DIAGNOSIS — R42 Dizziness and giddiness: Secondary | ICD-10-CM | POA: Diagnosis not present

## 2023-11-30 DIAGNOSIS — T887XXA Unspecified adverse effect of drug or medicament, initial encounter: Secondary | ICD-10-CM

## 2023-11-30 DIAGNOSIS — Z79899 Other long term (current) drug therapy: Secondary | ICD-10-CM | POA: Insufficient documentation

## 2023-11-30 DIAGNOSIS — R079 Chest pain, unspecified: Secondary | ICD-10-CM

## 2023-11-30 DIAGNOSIS — R1011 Right upper quadrant pain: Secondary | ICD-10-CM | POA: Diagnosis not present

## 2023-11-30 DIAGNOSIS — R0902 Hypoxemia: Secondary | ICD-10-CM | POA: Diagnosis not present

## 2023-11-30 DIAGNOSIS — R4182 Altered mental status, unspecified: Principal | ICD-10-CM | POA: Diagnosis present

## 2023-11-30 DIAGNOSIS — E785 Hyperlipidemia, unspecified: Secondary | ICD-10-CM | POA: Diagnosis not present

## 2023-11-30 DIAGNOSIS — R109 Unspecified abdominal pain: Secondary | ICD-10-CM

## 2023-11-30 DIAGNOSIS — D3502 Benign neoplasm of left adrenal gland: Secondary | ICD-10-CM | POA: Diagnosis not present

## 2023-11-30 HISTORY — DX: Unspecified abdominal pain: R10.9

## 2023-11-30 LAB — CBC WITH DIFFERENTIAL/PLATELET
Abs Immature Granulocytes: 0.09 10*3/uL — ABNORMAL HIGH (ref 0.00–0.07)
Basophils Absolute: 0 10*3/uL (ref 0.0–0.1)
Basophils Relative: 0 %
Eosinophils Absolute: 0.1 10*3/uL (ref 0.0–0.5)
Eosinophils Relative: 1 %
HCT: 41.1 % (ref 39.0–52.0)
Hemoglobin: 11.9 g/dL — ABNORMAL LOW (ref 13.0–17.0)
Immature Granulocytes: 1 %
Lymphocytes Relative: 7 %
Lymphs Abs: 1.2 10*3/uL (ref 0.7–4.0)
MCH: 25.9 pg — ABNORMAL LOW (ref 26.0–34.0)
MCHC: 29 g/dL — ABNORMAL LOW (ref 30.0–36.0)
MCV: 89.5 fL (ref 80.0–100.0)
Monocytes Absolute: 0.6 10*3/uL (ref 0.1–1.0)
Monocytes Relative: 4 %
Neutro Abs: 13.9 10*3/uL — ABNORMAL HIGH (ref 1.7–7.7)
Neutrophils Relative %: 87 %
Platelets: 201 10*3/uL (ref 150–400)
RBC: 4.59 MIL/uL (ref 4.22–5.81)
RDW: 17 % — ABNORMAL HIGH (ref 11.5–15.5)
WBC: 15.9 10*3/uL — ABNORMAL HIGH (ref 4.0–10.5)
nRBC: 0 % (ref 0.0–0.2)

## 2023-11-30 LAB — COMPREHENSIVE METABOLIC PANEL
ALT: 7 U/L (ref 0–44)
AST: 14 U/L — ABNORMAL LOW (ref 15–41)
Albumin: 2.2 g/dL — ABNORMAL LOW (ref 3.5–5.0)
Alkaline Phosphatase: 87 U/L (ref 38–126)
Anion gap: 15 (ref 5–15)
BUN: 34 mg/dL — ABNORMAL HIGH (ref 6–20)
CO2: 24 mmol/L (ref 22–32)
Calcium: 7.1 mg/dL — ABNORMAL LOW (ref 8.9–10.3)
Chloride: 99 mmol/L (ref 98–111)
Creatinine, Ser: 7.02 mg/dL — ABNORMAL HIGH (ref 0.61–1.24)
GFR, Estimated: 9 mL/min — ABNORMAL LOW (ref >60)
Glucose, Bld: 231 mg/dL — ABNORMAL HIGH (ref 70–99)
Potassium: 3.8 mmol/L (ref 3.5–5.1)
Sodium: 138 mmol/L (ref 135–145)
Total Bilirubin: 0.5 mg/dL (ref 0.0–1.2)
Total Protein: 5 g/dL — ABNORMAL LOW (ref 6.5–8.1)

## 2023-11-30 LAB — HEPATITIS B SURFACE ANTIGEN: Hepatitis B Surface Ag: NONREACTIVE

## 2023-11-30 LAB — LIPASE, BLOOD: Lipase: 29 U/L (ref 11–51)

## 2023-11-30 MED ORDER — CARVEDILOL 12.5 MG PO TABS
12.5000 mg | ORAL_TABLET | Freq: Two times a day (BID) | ORAL | Status: DC
  Administered 2023-12-01 – 2023-12-03 (×3): 12.5 mg via ORAL
  Filled 2023-11-30 (×3): qty 1

## 2023-11-30 MED ORDER — ALTEPLASE 2 MG IJ SOLR: 2.0000 mg | Freq: Once | INTRAMUSCULAR | Status: DC | PRN

## 2023-11-30 MED ORDER — ANTICOAGULANT SODIUM CITRATE 4% (200MG/5ML) IV SOLN: 5.0000 mL | Status: DC | PRN

## 2023-11-30 MED ORDER — PENTAFLUOROPROP-TETRAFLUOROETH EX AERO: 1.0000 | INHALATION_SPRAY | CUTANEOUS | Status: DC | PRN

## 2023-11-30 MED ORDER — INSULIN ASPART 100 UNIT/ML IJ SOLN
0.0000 [IU] | Freq: Three times a day (TID) | INTRAMUSCULAR | Status: DC
  Administered 2023-12-01: 1 [IU] via SUBCUTANEOUS
  Administered 2023-12-02: 2 [IU] via SUBCUTANEOUS
  Administered 2023-12-03: 1 [IU] via SUBCUTANEOUS

## 2023-11-30 MED ORDER — HEPARIN SODIUM (PORCINE) 1000 UNIT/ML DIALYSIS
1000.0000 [IU] | INTRAMUSCULAR | Status: DC | PRN
  Administered 2023-12-01: 3800 [IU]
  Filled 2023-11-30 (×5): qty 1

## 2023-11-30 MED ORDER — FLUOXETINE HCL 20 MG PO CAPS
20.0000 mg | ORAL_CAPSULE | Freq: Every day | ORAL | Status: DC
  Administered 2023-12-01 – 2023-12-03 (×3): 20 mg via ORAL
  Filled 2023-11-30 (×3): qty 1

## 2023-11-30 MED ORDER — DILTIAZEM HCL ER COATED BEADS 180 MG PO CP24
300.0000 mg | ORAL_CAPSULE | Freq: Every day | ORAL | Status: DC
  Administered 2023-12-01 – 2023-12-03 (×2): 300 mg via ORAL
  Filled 2023-11-30 (×3): qty 1

## 2023-11-30 MED ORDER — ATORVASTATIN CALCIUM 40 MG PO TABS
40.0000 mg | ORAL_TABLET | Freq: Every day | ORAL | Status: DC
  Administered 2023-12-01: 40 mg via ORAL
  Filled 2023-11-30: qty 1

## 2023-11-30 MED ORDER — HEPARIN SODIUM (PORCINE) 5000 UNIT/ML IJ SOLN
5000.0000 [IU] | Freq: Three times a day (TID) | INTRAMUSCULAR | Status: DC
  Administered 2023-12-01 – 2023-12-03 (×6): 5000 [IU] via SUBCUTANEOUS
  Filled 2023-11-30 (×7): qty 1

## 2023-11-30 MED ORDER — CHLORHEXIDINE GLUCONATE CLOTH 2 % EX PADS
6.0000 | MEDICATED_PAD | Freq: Every day | CUTANEOUS | Status: DC
  Administered 2023-12-01 – 2023-12-03 (×3): 6 via TOPICAL

## 2023-11-30 MED ORDER — HYDRALAZINE HCL 50 MG PO TABS
50.0000 mg | ORAL_TABLET | Freq: Two times a day (BID) | ORAL | Status: DC
  Administered 2023-12-01 – 2023-12-03 (×4): 50 mg via ORAL
  Filled 2023-11-30 (×4): qty 1

## 2023-11-30 MED ORDER — LIDOCAINE HCL (PF) 1 % IJ SOLN: 5.0000 mL | INTRAMUSCULAR | Status: DC | PRN

## 2023-11-30 MED ORDER — LIDOCAINE-PRILOCAINE 2.5-2.5 % EX CREA: 1.0000 | TOPICAL_CREAM | CUTANEOUS | Status: DC | PRN

## 2023-11-30 NOTE — Consult Note (Signed)
 [facility] Renal Consultation Note    Indication for Consultation:  Management of ESRD/hemodialysis, anemia, hypertension/volume, and secondary hyperparathyroidism.   HPI: [name] [name] is a 53 y.o. male with a history of ESRD, DM2, hypertension, right BKA, chronic pain, previous right hip fracture from fall, anemia, ARF, CKD, HLD, PAD, secondary HPTH.   [name] [name] He presented for dialysis today, and due to altered mental status, he was brought to the ED. His sister states that she was staying with him, heard him wake up at 0330 to take his pills, but the actual medication or amount that he ingested is unknown. He took his morning medication as well before HD. He told EMS that he took 2 Hydralazine  at 0500 and 0900, and he took his oxycodone  and baclofen  at 0900. His BP at HD was 85/51. He was alert but drowsy.   Evaluated in the ED, at this point his BP was quite high. He had slow and slurred speech. He had complaints of right upper abdominal pain and recent nausea. Patient has not had head imaging. Chest x-ray appears to have some fluid. Patient is on room air currently. Labs pending at time of interview.   He is new to HD. He dialyzes at [facility]. He has TDC and maturing AV left upper fistula (but minimal bruit heard)  Past Medical History:  Diagnosis Date   Abdominal tenderness 10/14/2023   Allergy    seasonal and environmental   Anemia    ARF (acute renal failure) (HCC) 09/2017   Chronic constipation 05/13/2020   CKD (chronic kidney disease) stage 4, GFR 15-29 ml/min (HCC)    Depression    Diabetes mellitus without complication (HCC) 2019   GERD (gastroesophageal reflux disease)    Hyperlipidemia    Hypertension    Necrotizing fasciitis (HCC) 10/13/2017   Neuromuscular disorder (HCC)    neuropathy   PAD (peripheral artery disease) (HCC)    Secondary hyperparathyroidism (HCC)    Wound dehiscence 11/24/2017   Past Surgical History:  Procedure Laterality Date    AMPUTATION Right 10/13/2017   Procedure: RIGHT BELOW KNEE AMPUTATION;  Surgeon: Harden Jerona GAILS, MD;  Location: [facility] OR;  Service: Orthopedics;  Laterality: Right;   AMPUTATION Right 11/24/2017   Procedure: AMPUTATION BELOW KNEE REVISION;  Surgeon: Harden Jerona GAILS, MD;  Location: [facility] OR;  Service: Orthopedics;  Laterality: Right;   AMPUTATION TOE Left    AV FISTULA PLACEMENT Left 10/19/2023   Procedure: LEFT BRACHIOCEPHALIC ARTERIOVENOUS (AV) FISTULA CREATION;  Surgeon: Pearline Norman RAMAN, MD;  Location: [facility] OR;  Service: Vascular;  Laterality: Left;   BIOPSY  09/02/2021   Procedure: BIOPSY;  Surgeon: Federico Rosario BROCKS, MD;  Location: [facility] ENDOSCOPY;  Service: Gastroenterology;;   BIOPSY  02/18/2023   Procedure: BIOPSY;  Surgeon: Leigh Elspeth SQUIBB, MD;  Location: [facility] ENDOSCOPY;  Service: Gastroenterology;;   COLONOSCOPY  05/11/2019   ESOPHAGOGASTRODUODENOSCOPY (EGD) WITH PROPOFOL  N/A 09/02/2021   Procedure: ESOPHAGOGASTRODUODENOSCOPY (EGD) WITH PROPOFOL ;  Surgeon: Federico Rosario BROCKS, MD;  Location: [facility] ENDOSCOPY;  Service: Gastroenterology;  Laterality: N/A;   ESOPHAGOGASTRODUODENOSCOPY (EGD) WITH PROPOFOL  N/A 02/18/2023   Procedure: ESOPHAGOGASTRODUODENOSCOPY (EGD) WITH PROPOFOL ;  Surgeon: Leigh Elspeth SQUIBB, MD;  Location: [facility] ENDOSCOPY;  Service: Gastroenterology;  Laterality: N/A;   INTRAMEDULLARY (IM) NAIL INTERTROCHANTERIC Right 10/15/2023   Procedure: INTRAMEDULLARY (IM) NAIL INTERTROCHANTERIC;  Surgeon: Kendal Franky SQUIBB, MD;  Location: [facility] OR;  Service: Orthopedics;  Laterality: Right;   IR FLUORO GUIDE CV LINE RIGHT  10/15/2023   IR  US  GUIDE VASC ACCESS RIGHT  10/15/2023   NO PAST SURGERIES     POLYPECTOMY     WISDOM TOOTH EXTRACTION     Family History  Problem Relation Age of Onset   Diabetes Mellitus II Mother    Depression Mother    Colon polyps Mother    Hypertension Mother    High Cholesterol Mother    Heart disease Father        CABG x 4.  04/2017   Diabetes Father    High  Cholesterol Father    Hypertension Father    Heart disease Maternal Grandmother    Heart disease Maternal Grandfather    Colon cancer Neg Hx    Esophageal cancer Neg Hx    Liver cancer Neg Hx    Pancreatic cancer Neg Hx    Rectal cancer Neg Hx    Stomach cancer Neg Hx    Social History:  reports that he has been smoking cigarettes. He started smoking about 35 years ago. He has a 17.8 pack-year smoking history. He has never used smokeless tobacco. He reports that he does not drink alcohol and does not use drugs.   Review of Systems: Gen: Denies any fever, chills, fatigue, weakness, malaise, weight loss, and/or sleep disorders. HEENT: Denies blurred vision, sore throat or epistaxis. No sinusitis.   CV: Denies chest pain, angina, palpitations,  Resp: Denies dyspnea at rest, dyspnea with exercise, cough, SOB GI: States he has history of daily vomiting, tried to enrol in gastroparesis study but not accepted. GU: Denies dysuria or hematuria. MS: Denies joint pain, muscle pain or cramps.  Derm: Denies rashes. He states he has always had dry skin. Neuro: No headache, dizziness, or weakness. Endocrine: states secondary HPTH  Physical Exam: Vitals:   11/30/23 1345 11/30/23 1400 11/30/23 1415 11/30/23 1417  BP: (!) 144/71 126/71 (!) 140/67 125/71  Pulse: 63 63 62 61  Resp: (!) 0 (!) 9 10   Temp:      TempSrc:      SpO2: 92% (!) 88% 90% 93%     General: Slow to respond, chronically ill appearing, delayed speaking Head: Normocephalic, atraumatic,  mucus membranes are dry. Neck: Supple without lymphadenopathy/masses.  Lungs: CTA, no wheezing or rales Heart: RRR with normal S1, S2. 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 Moves all extremities  spontaneously. Psych:  Responds to questions slowly with a flat and delayed affect, oriented x 4. Dialysis Access: Right TDC and no bruit heard during exam  Allergies  Allergen Reactions   Iron  Nausea And Vomiting    With the iron  tablets   Tape Rash    Rash at site of tape--okay to use paper tape   Prior to Admission medications   Medication Sig Start Date End Date Taking? Authorizing Provider  acetaminophen  (TYLENOL ) 500 MG tablet Take 2 tablets (1,000 mg total) by mouth every 6 (six) hours as needed. 10/26/23   Baloch, Mahnoor, MD  CARTER ULTRA-THIN LANCETS MISC Check blood sugars before meals twice daily 11/19/17   Adella Norris, MD  atorvastatin  (LIPITOR) 40 MG tablet Take 1 tablet (40 mg total) by mouth at bedtime. Patient taking differently: Take 40 mg by mouth daily. 12/14/22   Christia Budds, MD  baclofen  (LIORESAL ) 10  MG tablet Take 0.5 tablets (5 mg total) by mouth daily. 10/26/23   Baloch, Mahnoor, MD  Blood Glucose Monitoring Suppl (ACCU-CHEK GUIDE ME) w/Device KIT See admin instructions. 02/23/23   [provider]  calcitRIOL  (ROCALTROL ) 0.25 MCG capsule Take 1 capsule (0.25 mcg total) by mouth Every Tuesday,Thursday,and Saturday with dialysis. 10/26/23   Baloch, Mahnoor, MD  carvedilol  (COREG ) 12.5 MG tablet Take 12.5 mg by mouth 2 (two) times daily with a meal.    [provider]  Darbepoetin Alfa  (ARANESP ) 150 MCG/0.3ML SOSY injection Inject 0.3 mLs (150 mcg total) into the skin every Saturday at 6 PM. 10/30/23   Baloch, Mahnoor, MD  diclofenac  Sodium (VOLTAREN ) 1 % GEL Apply 4 g topically 4 (four) times daily. 11/29/23   Christia Budds, MD  diltiazem  (CARDIZEM  CD) 300 MG 24 hr capsule Take 1 capsule (300 mg total) by mouth daily. 10/26/23   Baloch, Mahnoor, MD  FLUoxetine  (PROZAC ) 20 MG capsule Take 1 capsule (20 mg total) by mouth daily. 10/27/23   Baloch, Mahnoor, MD  gabapentin  (NEURONTIN ) 300 MG capsule Take 1 capsule (300 mg total) by mouth daily.  10/26/23   Baloch, Mahnoor, MD  glucose blood (AGAMATRIX PRESTO TEST) test strip Check blood sugars twice daily before meals 02/02/18   Adella Norris, MD  glucose blood test strip 1 each by Other route as needed for other. Use as instructed    [provider]  hydrALAZINE  (APRESOLINE ) 50 MG tablet Take 1 tablet (50 mg total) by mouth 2 (two) times daily. 10/26/23   Baloch, Mahnoor, MD  Insulin  Disposable Pump (OMNIPOD DASH PODS, GEN 4,) MISC  04/15/22   [provider]  insulin  lispro (HUMALOG ) 100 UNIT/ML injection 0-6 Units, Subcutaneous, 3 times daily with meals, First dose on Sun 10/17/23 at 0800 Correction coverage: Very Sensitive (ESRD/Dialysis) CBG < 70: Implement Hypoglycemia Standing Orders and refer to Hypoglycemia Standing Orders sidebar report CBG 70 - 120: 0 units CBG 121 - 150: 0 units CBG 151 - 200: 1 unit CBG 201-250: 2 units CBG 251-300: 3 units CBG 301-350: 4 units CBG 351-400: 5 units CBG > 400: Give 6 units and call MD 10/26/23   Lonnie Mahnoor, MD  lidocaine  (HM LIDOCAINE  PATCH) 4 % Place 1 patch onto the skin daily. 11/29/23   Christia Budds, MD  ondansetron  (ZOFRAN -ODT) 8 MG disintegrating tablet DISSOLVE 1 TABLET(8 MG) ON THE TONGUE EVERY 8 HOURS AS NEEDED FOR NAUSEA OR VOMITING 08/12/23   Christia Budds, MD  Oxycodone  HCl 10 MG TABS Take 0.5-1 tablets (5-10 mg total) by mouth every 4 (four) hours as needed. 10/26/23   Baloch, Mahnoor, MD  polyethylene glycol (MIRALAX  / GLYCOLAX ) 17 g packet Mix as directed and take 34 g by mouth 3 (three) times daily. 10/26/23   Baloch, Mahnoor, MD  senna (SENOKOT) 8.6 MG TABS tablet Take 1 tablet (8.6 mg total) by mouth 2 (two) times daily. 10/26/23   Baloch, Mahnoor, MD  sevelamer  carbonate (RENVELA ) 800 MG tablet Take 2 tablets (1,600 mg total) by mouth 3 (three) times daily with meals. 10/26/23   Baloch, Mahnoor, MD  tamsulosin  (FLOMAX ) 0.4 MG CAPS capsule Take 1 capsule (0.4 mg total) by mouth daily after supper.  10/26/23   Baloch, Mahnoor, MD  Vitamin D , Ergocalciferol , (DRISDOL ) 1.25 MG (50000 UNIT) CAPS capsule Take 1 capsule (50,000 Units total) by mouth every 7 (seven) days. 10/26/23   Baloch, Mahnoor, MD   No current facility-administered medications for this encounter.   Current Outpatient Medications  Medication Sig Dispense Refill   acetaminophen  (TYLENOL ) 500 MG tablet Take 2 tablets (1,000 mg total) by mouth every 6 (six) hours as needed. 30 tablet 0   AGAMATRIX ULTRA-THIN LANCETS MISC Check blood sugars before meals twice daily 100 each 11   atorvastatin  (LIPITOR) 40 MG tablet Take 1 tablet (40 mg total) by mouth at bedtime. (Patient taking differently: Take 40 mg by mouth daily.) 90 tablet 0   baclofen  (LIORESAL ) 10 MG tablet Take 0.5 tablets (5 mg total) by mouth daily. 30 each 0   Blood Glucose Monitoring Suppl (ACCU-CHEK GUIDE ME) w/Device KIT See admin instructions.     calcitRIOL  (ROCALTROL ) 0.25 MCG capsule Take 1 capsule (0.25 mcg total) by mouth Every Tuesday,Thursday,and Saturday with dialysis. 30 capsule 0   carvedilol  (COREG ) 12.5 MG tablet Take 12.5 mg by mouth 2 (two) times daily with a meal.     Darbepoetin Alfa  (ARANESP ) 150 MCG/0.3ML SOSY injection Inject 0.3 mLs (150 mcg total) into the skin every Saturday at 6 PM.     diclofenac  Sodium (VOLTAREN ) 1 % GEL Apply 4 g topically 4 (four) times daily. 100 g 0   diltiazem  (CARDIZEM  CD) 300 MG 24 hr capsule Take 1 capsule (300 mg total) by mouth daily. 30 capsule 0   FLUoxetine  (PROZAC ) 20 MG capsule Take 1 capsule (20 mg total) by mouth daily. 30 capsule 0   gabapentin  (NEURONTIN ) 300 MG capsule Take 1 capsule (300 mg total) by mouth daily. 30 capsule 0   glucose blood (AGAMATRIX PRESTO TEST) test strip Check blood sugars twice daily before meals 100 each 12   glucose blood test strip 1 each by Other route as needed for other. Use as instructed     hydrALAZINE  (APRESOLINE ) 50 MG tablet Take 1 tablet (50 mg total) by mouth 2 (two)  times daily. 30 tablet 0   Insulin  Disposable Pump (OMNIPOD DASH PODS, GEN 4,) MISC      insulin  lispro (HUMALOG ) 100 UNIT/ML injection 0-6 Units, Subcutaneous, 3 times daily with meals, First dose on Sun 10/17/23 at 0800 Correction coverage: Very Sensitive (ESRD/Dialysis) CBG < 70: Implement Hypoglycemia Standing Orders and refer to Hypoglycemia Standing Orders sidebar report CBG 70 - 120: 0 units CBG 121 - 150: 0 units CBG 151 - 200: 1 unit CBG 201-250: 2 units CBG 251-300: 3 units CBG 301-350: 4 units CBG 351-400: 5 units CBG > 400: Give 6 units and call MD     lidocaine  (HM LIDOCAINE  PATCH) 4 % Place 1 patch onto the skin daily. 30 patch 0   ondansetron  (ZOFRAN -ODT) 8 MG disintegrating tablet DISSOLVE 1 TABLET(8 MG) ON THE TONGUE EVERY 8 HOURS AS NEEDED FOR NAUSEA OR VOMITING 20 tablet 0   Oxycodone  HCl 10 MG TABS Take 0.5-1 tablets (5-10 mg total) by mouth every 4 (four) hours as needed. 30 tablet 0   polyethylene glycol (MIRALAX  / GLYCOLAX ) 17 g packet Mix as directed and take 34 g by mouth 3 (three) times daily. 14 each 0   senna (SENOKOT) 8.6 MG TABS tablet Take 1 tablet (8.6 mg total) by mouth 2 (two) times daily. 120 tablet 0   sevelamer  carbonate (RENVELA ) 800 MG tablet Take 2 tablets (1,600 mg total) by mouth 3 (three) times daily with meals. 180 tablet 0   tamsulosin  (FLOMAX ) 0.4 MG CAPS capsule Take 1 capsule (0.4 mg total) by mouth daily after supper. 30 capsule 0   Vitamin D , Ergocalciferol , (DRISDOL ) 1.25 MG (50000 UNIT) CAPS capsule Take 1 capsule (  50,000 Units total) by mouth every 7 (seven) days. 5 capsule 0   Labs: Basic Metabolic Panel: Recent Labs  Lab 11/30/23 1517  NA 138  K 3.8  CL 99  CO2 24  GLUCOSE 231*  BUN 34*  CREATININE 7.02*  CALCIUM  7.1*   Liver Function Tests: Recent Labs  Lab 11/30/23 1517  AST 14*  ALT 7  ALKPHOS 87  BILITOT 0.5  PROT 5.0*  ALBUMIN  2.2*   CBC: Recent Labs  Lab 11/30/23 1517  WBC 15.9*  NEUTROABS 13.9*  HGB  11.9*  HCT 41.1  MCV 89.5  PLT 201     Dialysis Orders: Center: [facility] . Right TDC; left BC AV fistula 11/19/23 415 H, 400/600, EDW 104 kg, 2.0 K/ 2.5 Ca, 2000 U heparin  Mircera 150 mcg IV Q2W; last dose 11/16/23 Calcitriol  0.25 mcg PO Q Dialysis   Assessment/Plan:  Altered mental status/ encephalopathy: unclear at this time. possible medication vs metabolic. Would recommend work up by hospitalist. Was on combination Oxy, Baclofen , Gabapentin  outpatient, would recommend holding these medications for now. If solely medication induced, this should clear with HD.   ESRD:  Due for dialysis today. Will add him to the schedule for tonight if census allows.  Hypertension/volume: he was hypotensive this morning when EMS responded, but was hypertensive when seen in the ED. Continue Carvedilol  12.5 mg BID and Hydralazine  50mg  BID  Anemia: Hgb was 11.9. Hold ESA d/t stable Hgb  Metabolic bone disease: is taking Calcitriol  0.25 mcg PO at dialysis. Not on binders currently.  Nutrition: Albumin  2.2. Will reevaluate protein supplements when he is less altered.  T2DM: insulin  per primary PAD: s/p right BKA   Belvie Och, AGNP 11/30/2023, 4:21 PM  [employer]

## 2023-11-30 NOTE — Assessment & Plan Note (Addendum)
 New onset pain in the RUQ/epigastric region after being examined today, however has had history of diarrhea (possibly steatorrhea) over the past few months and N/V for past 2 years. The differential is broad and listed above.  - Monitor abdominal pain and related symptoms - MRI abdomen w/ MRCP to work up pancreatic hypodense areas seen on CT - procalcitonin to assess the need for antibiotics for possibly infectious colitis - am CBC

## 2023-11-30 NOTE — ED Notes (Signed)
 Patient transported to CT

## 2023-11-30 NOTE — Addendum Note (Signed)
 Addended by: [name] on: 11/30/2023 11:08 AM   Modules accepted: Orders

## 2023-11-30 NOTE — Assessment & Plan Note (Addendum)
 Likely secondary to medication vs metabolic encephalopathy in setting of taking baclofen  and oxycodone  10 mg q4h prn with ESRD. Due for HD today, if medication vs metabolic this should clear with HD. He is currently A&O, just a little slow to speak but answering all questions fully and appropriately - Admit to [facility], attending Dr. Donzetta - Med-Surg, Vital signs per floor - AM CBC/RFP - Monitor for changes in mental status - HD per nephrology - fall precautions - hold sedating medications

## 2023-11-30 NOTE — ED Notes (Signed)
 50M Secretary Shaquella requested to inform nurse assigned for pt inpt room assignment that pt is going to dialysis at this time, and will not be coming to his room for a few hours. Dialysis RN also notified that pt has a room assigned for completion of pt dialysis treatment.

## 2023-11-30 NOTE — Assessment & Plan Note (Addendum)
 HD TTS.  Nephrology on board, appreciate recommendations.  - HD tonight if able

## 2023-11-30 NOTE — ED Provider Notes (Signed)
 [place] [facility]ERGENCY DEPARTMENT AT [facility] Provider Note   CSN: [id number] Arrival date & time: 11/30/23  1145     History  Chief Complaint  Patient presents with   Hypotension   Dizziness    [name] [name] is a 53 y.o. male.  53 yo M with PMH ESRD on HD, PAD s/p R BKA, T2DM, GERD, HLD, MDD who presented to ED with confusion and hypotension at his HD session. He notes he remembers the events of yesterday, he took his hydralaze late at 3:30 AM and then again at 7 AM with his home baclofen  and oxycodone . His BP at HD was reportedly 80s/50s.  Patient sisters at the bedside.  She indicates that yesterday patient was tired, and fell asleep, leading to him missing his night meds.  At 3:30 AM he woke up and took his night meds.  Patient was fine when he went for dialysis, but at dialysis he was hypotensive and sluggish.  During her assessment now, patient remains slow to respond.   Patient denies any associated one-sided weakness, numbness, tingling, dizziness, vision change.  He feels like his speech is taking more effort than usual.  No recent illnesses.  Patient recently started on dialysis.    Home Medications Prior to Admission medications   Medication Sig Start Date End Date Taking? Authorizing Provider  acetaminophen  (TYLENOL ) 500 MG tablet Take 2 tablets (1,000 mg total) by mouth every 6 (six) hours as needed. 10/26/23  Yes Baloch, Mahnoor, MD  atorvastatin  (LIPITOR) 40 MG tablet Take 1 tablet (40 mg total) by mouth at bedtime. Patient taking differently: Take 40 mg by mouth daily. 12/14/22  Yes Christia Budds, MD  baclofen  (LIORESAL ) 10 MG tablet Take 0.5 tablets (5 mg total) by mouth daily. Patient taking differently: Take 10 mg by mouth daily. 10/26/23  Yes Baloch, Mahnoor, MD  calcitRIOL  (ROCALTROL ) 0.25 MCG capsule Take 1 capsule (0.25 mcg total) by mouth Every Tuesday,Thursday,and Saturday with dialysis. 10/26/23  Yes Baloch, Mahnoor, MD  carvedilol  (COREG ) 12.5  MG tablet Take 12.5 mg by mouth 2 (two) times daily with a meal.   Yes [provider]  Darbepoetin Alfa  (ARANESP ) 150 MCG/0.3ML SOSY injection Inject 0.3 mLs (150 mcg total) into the skin every Saturday at 6 PM. 10/30/23  Yes Baloch, Mahnoor, MD  diltiazem  (CARDIZEM  CD) 300 MG 24 hr capsule Take 1 capsule (300 mg total) by mouth daily. 10/26/23  Yes Baloch, Mahnoor, MD  FLUoxetine  (PROZAC ) 20 MG capsule Take 1 capsule (20 mg total) by mouth daily. 10/27/23  Yes Baloch, Mahnoor, MD  gabapentin  (NEURONTIN ) 300 MG capsule Take 1 capsule (300 mg total) by mouth daily. 10/26/23  Yes Baloch, Mahnoor, MD  hydrALAZINE  (APRESOLINE ) 50 MG tablet Take 1 tablet (50 mg total) by mouth 2 (two) times daily. 10/26/23  Yes Baloch, Mahnoor, MD  Insulin  Disposable Pump (OMNIPOD DASH PODS, GEN 4,) MISC  04/15/22  Yes [provider]  insulin  lispro (HUMALOG ) 100 UNIT/ML injection 0-6 Units, Subcutaneous, 3 times daily with meals, First dose on Sun 10/17/23 at 0800 Correction coverage: Very Sensitive (ESRD/Dialysis) CBG < 70: Implement Hypoglycemia Standing Orders and refer to Hypoglycemia Standing Orders sidebar report CBG 70 - 120: 0 units CBG 121 - 150: 0 units CBG 151 - 200: 1 unit CBG 201-250: 2 units CBG 251-300: 3 units CBG 301-350: 4 units CBG 351-400: 5 units CBG > 400: Give 6 units and call MD 10/26/23  Yes Baloch, Mahnoor, MD  ondansetron  (ZOFRAN -ODT) 8 MG  disintegrating tablet DISSOLVE 1 TABLET(8 MG) ON THE TONGUE EVERY 8 HOURS AS NEEDED FOR NAUSEA OR VOMITING 08/12/23  Yes Christia Budds, MD  Oxycodone  HCl 10 MG TABS Take 0.5-1 tablets (5-10 mg total) by mouth every 4 (four) hours as needed. 10/26/23  Yes Baloch, Mahnoor, MD  polyethylene glycol (MIRALAX  / GLYCOLAX ) 17 g packet Mix as directed and take 34 g by mouth 3 (three) times daily. 10/26/23  Yes Baloch, Mahnoor, MD  senna (SENOKOT) 8.6 MG TABS tablet Take 1 tablet (8.6 mg total) by mouth 2 (two) times daily. 10/26/23  Yes Baloch,  Mahnoor, MD  sevelamer  carbonate (RENVELA ) 800 MG tablet Take 2 tablets (1,600 mg total) by mouth 3 (three) times daily with meals. 10/26/23  Yes Baloch, Mahnoor, MD  tamsulosin  (FLOMAX ) 0.4 MG CAPS capsule Take 1 capsule (0.4 mg total) by mouth daily after supper. 10/26/23  Yes Baloch, Mahnoor, MD  CARTER ULTRA-THIN LANCETS MISC Check blood sugars before meals twice daily 11/19/17   Adella Norris, MD  Blood Glucose Monitoring Suppl (ACCU-CHEK GUIDE ME) w/Device KIT See admin instructions. 02/23/23   [provider]  diclofenac  Sodium (VOLTAREN ) 1 % GEL Apply 4 g topically 4 (four) times daily. Patient not taking: Reported on 11/30/2023 11/29/23   Christia Budds, MD  glucose blood (AGAMATRIX PRESTO TEST) test strip Check blood sugars twice daily before meals 02/02/18   Adella Norris, MD  glucose blood test strip 1 each by Other route as needed for other. Use as instructed    [provider]  lidocaine  (HM LIDOCAINE  PATCH) 4 % Place 1 patch onto the skin daily. Patient not taking: Reported on 11/30/2023 11/29/23   Christia Budds, MD  metoprolol  succinate (TOPROL -XL) 25 MG 24 hr tablet Take 25 mg by mouth daily. Patient not taking: Reported on 11/30/2023 10/13/23   [provider]  Vitamin D , Ergocalciferol , (DRISDOL ) 1.25 MG (50000 UNIT) CAPS capsule Take 1 capsule (50,000 Units total) by mouth every 7 (seven) days. Patient not taking: Reported on 11/30/2023 10/26/23   Baloch, Mahnoor, MD      Allergies    Iron  and Tape    Review of Systems   Review of Systems  All other systems reviewed and are negative.   Physical Exam Updated Vital Signs BP (!) 171/90 (BP Location: Right Arm)   Pulse 70   Temp 98 F (36.7 C) (Oral)   Resp 20   Ht 6' 5 (1.956 m)   Wt 101 kg   SpO2 98%   BMI 26.40 kg/m  Physical Exam Vitals and nursing note reviewed.  Constitutional:      Appearance: He is well-developed.  HENT:     Head: Atraumatic.  Cardiovascular:     Rate  and Rhythm: Normal rate.  Pulmonary:     Effort: Pulmonary effort is normal.  Abdominal:     Tenderness: There is abdominal tenderness.     Comments: Patient has right upper quadrant tenderness and epigastric tenderness with palpation  Musculoskeletal:     Cervical back: Neck supple.  Skin:    General: Skin is warm.  Neurological:     Mental Status: He is alert and oriented to person, place, and time.     ED Results / Procedures / Treatments   Labs (all labs ordered are listed, but only abnormal results are displayed) Labs Reviewed  COMPREHENSIVE METABOLIC PANEL - Abnormal; Notable for the following components:      Result Value   Glucose, Bld 231 (*)  BUN 34 (*)    Creatinine, Ser 7.02 (*)    Calcium  7.1 (*)    Total Protein 5.0 (*)    Albumin  2.2 (*)    AST 14 (*)    GFR, Estimated 9 (*)    All other components within normal limits  CBC WITH DIFFERENTIAL/PLATELET - Abnormal; Notable for the following components:   WBC 15.9 (*)    Hemoglobin 11.9 (*)    MCH 25.9 (*)    MCHC 29.0 (*)    RDW 17.0 (*)    Neutro Abs 13.9 (*)    Abs Immature Granulocytes 0.09 (*)    All other components within normal limits  HEPATITIS B SURFACE ANTIBODY, QUANTITATIVE - Abnormal; Notable for the following components:   Hep B S AB Quant (Post) <3.5 (*)    All other components within normal limits  CBC - Abnormal; Notable for the following components:   RBC 4.12 (*)    Hemoglobin 10.7 (*)    HCT 35.5 (*)    RDW 16.9 (*)    All other components within normal limits  RENAL FUNCTION PANEL - Abnormal; Notable for the following components:   Chloride 96 (*)    Glucose, Bld 151 (*)    Creatinine, Ser 4.62 (*)    Calcium  7.3 (*)    Albumin  2.3 (*)    GFR, Estimated 15 (*)    All other components within normal limits  GLUCOSE, CAPILLARY - Abnormal; Notable for the following components:   Glucose-Capillary 104 (*)    All other components within normal limits  GLUCOSE, CAPILLARY -  Abnormal; Notable for the following components:   Glucose-Capillary 124 (*)    All other components within normal limits  GLUCOSE, CAPILLARY - Abnormal; Notable for the following components:   Glucose-Capillary 179 (*)    All other components within normal limits  MRSA NEXT GEN BY PCR, NASAL  LIPASE, BLOOD  HEPATITIS B SURFACE ANTIGEN  PROCALCITONIN  TROPONIN I (HIGH SENSITIVITY)  TROPONIN I (HIGH SENSITIVITY)    EKG EKG Interpretation Date/Time:  Tuesday November 30 2023 15:14:12 EST Ventricular Rate:  70 PR Interval:  187 QRS Duration:  92 QT Interval:  447 QTC Calculation: 483 R Axis:   60  Text Interpretation: Sinus rhythm ST elev, probable normal early repol pattern Borderline prolonged QT interval Confirmed by Jerrol Agent ([id number]) on 11/30/2023 5:04:32 PM  Radiology MR ABDOMEN MRCP WO CONTRAST Result Date: 12/01/2023 CLINICAL DATA:  Diffuse colonic wall thickening. RIGHT upper quadrant pain. EXAM: MRI ABDOMEN WITHOUT CONTRAST  (INCLUDING MRCP) TECHNIQUE: Multiplanar multisequence MR imaging of the abdomen was performed. Heavily T2-weighted images of the biliary and pancreatic ducts were obtained, and three-dimensional MRCP images were rendered by post processing. COMPARISON:  CT 11/30/2023 FINDINGS: Lower chest:  Small bilateral pleural effusions Hepatobiliary: No focal hepatic lesion. No intrahepatic biliary duct dilatation. Common bile duct is normal. There are several dependent gallstones within the gallbladder. No gallbladder inflammation or distension. Is gallstones are small measuring approximately 3 mm each (image 64/series 7) Pancreas: There is extensive ductal ectasia in side branch cystic dilatation in the tail the pancreas. Findings are similar to mildly progressed from chest MRI 07/03/2022. Largest cluster of cysts measures 2.4 x 2.6 cm in the midbody on image 22/3. No downstream ductal dilatation. No evidence of pancreatic inflammation. Spleen: Normal spleen.  Adrenals/urinary tract: Enlargement of the LEFT adrenal gland to 4.3 x 2.0 cm (image 37/7. The gland has loss of signal intensity on out of phase  opposed phase imaging (image 36/series 702) findings consistent benign LEFT adrenal adenoma. RIGHT adrenal gland appears normal. Normal kidneys Stomach/Bowel: Stomach and limited of the small bowel is unremarkable Vascular/Lymphatic: Abdominal aortic normal caliber. No retroperitoneal periportal lymphadenopathy. Musculoskeletal: No aggressive osseous lesion IMPRESSION: 1. Cholelithiasis without evidence cholecystitis. 2. No biliary duct dilatation. 3. Extensive ductal ectasia and side branch cystic dilatation in the tail the pancreas. Findings are similar to mildly progressed from chest MRI 07/03/2022. 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 Recommend follow-up MRI with contrast in 6 months. This recommendation follows ACR consensus guidelines: Management of Incidental Pancreatic Cysts: A White Paper of the ACR Incidental Findings Committee. J Am Coll Radiol 2017;14:911-923. 4. Benign LEFT adrenal adenoma. 5. Small bilateral pleural effusions. Electronically Signed   By: Jackquline Boxer M.D.   On: 12/01/2023 13:32   CT HEAD WO CONTRAST ( ) Result Date: 11/30/2023 CLINICAL DATA:  Mental status changes, confusion EXAM: CT HEAD WITHOUT CONTRAST TECHNIQUE: Contiguous axial images were obtained from the base of the skull through the vertex without intravenous contrast. RADIATION DOSE REDUCTION: This exam was performed according to the departmental dose-optimization program which includes automated exposure control, adjustment of the mA and/or kV according to patient size and/or use of iterative reconstruction technique. COMPARISON:  10/14/2023 FINDINGS: Brain: No acute intracranial abnormality. Specifically, no hemorrhage, hydrocephalus, mass lesion, acute infarction, or significant intracranial injury. Vascular: No hyperdense vessel or unexpected  calcification. Skull: No acute calvarial abnormality. Sinuses/Orbits: No acute findings Other: None IMPRESSION: No acute intracranial abnormality. Electronically Signed   By: Franky Crease M.D.   On: 11/30/2023 20:02   CT ABDOMEN PELVIS WO CONTRAST Result Date: 11/30/2023 CLINICAL DATA:  Right upper quadrant pain.  Epigastric pain. EXAM: CT ABDOMEN AND PELVIS WITHOUT CONTRAST TECHNIQUE: Multidetector CT imaging of the abdomen and pelvis was performed following the standard protocol without IV contrast. RADIATION DOSE REDUCTION: This exam was performed according to the departmental dose-optimization program which includes automated exposure control, adjustment of the mA and/or kV according to patient size and/or use of iterative reconstruction technique. COMPARISON:  CT abdomen and pelvis 10/13/2023. FINDINGS: Lower chest: There are trace bilateral pleural effusions. There are ground-glass opacities in the lung bases. Hepatobiliary: Gallstones are present. There is no biliary ductal dilatation. Liver is within normal limits. Pancreas: 2.5 x 1.9 cm hypodense areas seen in the body of the pancreas. No acute inflammation or ductal dilatation. Spleen: Within normal limits. Adrenals/Urinary Tract: Low-density left adrenal nodule measures 4.2 x 1.6 cm and appears unchanged compatible with adenoma. Right adrenal gland is within normal limits. Kidneys and bladder are within normal limits. Stomach/Bowel: There is diffuse colonic wall thickening/edema of the ascending colon without surrounding inflammation. No dilated bowel loops, pneumatosis or free air. The appendix is within normal limits. There is sigmoid colon diverticulosis. Small bowel and stomach are within normal limits. Vascular/Lymphatic: Aortic atherosclerosis. No enlarged abdominal or pelvic lymph nodes. Reproductive: Prostate is unremarkable. Other: There is no ascites.  There is mild diffuse body wall edema. Musculoskeletal: No acute osseous abnormality.  Degenerative changes affect the spine. Right hip screw is present. IMPRESSION: 1. Diffuse colonic wall thickening/edema of the ascending colon compatible with colitis. 2. Cholelithiasis. 3. Stable left adrenal adenoma. 4. Trace bilateral pleural effusions. 5. Ground-glass opacities in the lung bases may represent edema or infection. 6. Aortic atherosclerosis. 7. 2.5 x 1.9 cm hypodense areas seen in the body of the pancreas. Further evaluation with MRI recommended. Aortic Atherosclerosis (ICD10-I70.0). Electronically Signed   By:  Greig Pique M.D.   On: 11/30/2023 17:05   DG Chest Port 1 View Result Date: 11/30/2023 CLINICAL DATA:  Hypotension, dizziness. EXAM: PORTABLE CHEST 1 VIEW COMPARISON:  February 15, 2023. FINDINGS: Stable cardiomediastinal silhouette. No acute pulmonary disease is noted. Right internal jugular dialysis catheter is noted with distal tip in expected position of right atrium. Bony thorax is unremarkable. IMPRESSION: No active disease. Electronically Signed   By: Lynwood Landy Raddle M.D.   On: 11/30/2023 16:13    Procedures Procedures    Medications Ordered in ED Medications  Chlorhexidine  Gluconate Cloth 2 % PADS 6 each (6 each Topical Given 12/01/23 0543)  atorvastatin  (LIPITOR) tablet 40 mg (0 mg Oral Hold 11/30/23 2335)  carvedilol  (COREG ) tablet 12.5 mg (12.5 mg Oral Given 12/01/23 1014)  diltiazem  (CARDIZEM  CD) 24 hr capsule 300 mg (300 mg Oral Given 12/01/23 1014)  hydrALAZINE  (APRESOLINE ) tablet 50 mg (50 mg Oral Given 12/01/23 1014)  FLUoxetine  (PROZAC ) capsule 20 mg (20 mg Oral Given 12/01/23 1014)  heparin  injection 5,000 Units (5,000 Units Subcutaneous Given 12/01/23 1303)  insulin  aspart (novoLOG ) injection 0-6 Units (1 Units Subcutaneous Given 12/01/23 1303)  calcitRIOL  (ROCALTROL ) capsule 0.25 mcg (has no administration in time range)  (feeding supplement) PROSource Plus liquid 30 mL (30 mLs Oral Given 12/01/23 1322)  gabapentin  (NEURONTIN ) capsule 300 mg (300 mg Oral Given 12/01/23  1303)    ED Course/ Medical Decision Making/ A&P                                 Medical Decision Making Amount and/or Complexity of Data Reviewed Labs: ordered. Radiology: ordered.  Risk Decision regarding hospitalization.   This patient presents to the ED with chief complaint(s) of altered mental status with pertinent past medical history of new dialysis, recent hip replacement surgery, diabetes and patient is on psych medications, and pain medications.The complaint involves an extensive differential diagnosis and also carries with it a high risk of complications and morbidity.    During my assessment, patient normotensive.  However he is slow to respond.  He is AO x 3.,  Just more sleepy, somnolent.  He was also found to have right upper quadrant and epigastric abdominal tenderness.  The differential diagnosis includes toxic encephalopathy, worsening uremia, severe electrolyte abnormality, medication side effects.  For the abdominal pain, differential diagnosis includes pancreatitis, cholelithiasis, gastroenteritis, peptic ulcer disease  The initial plan is to get basic labs, CT abdomen pelvis without contrast.  My initial suspicion is that patient is having toxic encephalopathy.  I have reached out to nephrology team and spoken with Izetta Daring -patient will get dialysis and their team will follow-up.  No need for emergent hemodialysis.   Additional history obtained: Additional history obtained from family Records reviewed previous admission documents  Independent labs interpretation:  The following labs were independently interpreted: CBC is normal.  Rest of the labs are pending.  Patient has mild leukocytosis, no clinical concerns for infection at this time.  CT pending.   Final Clinical Impression(s) / ED Diagnoses Final diagnoses:  Hypotension, unspecified hypotension type  Toxic encephalopathy, unspecified toxin  Medication side effect    Rx / DC Orders ED  Discharge Orders     None         Charlyn Sora, MD 12/01/23 1537

## 2023-11-30 NOTE — ED Triage Notes (Signed)
 Pt arrived via EMS from HD. He states he is sleepy and states he took 2 Hydralazine  at 0500 and 0900,reports he took oxycodone  and baclofen  at 0900. Pt reports dizziness and sleepiness. Pt initial BP at HD was 85/51, EMS got 90/60. PT is alert and drowsy. He has pinpoint pupils and asking to have a bowel movement. PT has maturing HD graft to left arm and has right chest HD cath capped. HR 76, CBG 219, sats 89-91% RA and placed on 02/ 2L. PT has pain to right stump and leg that is chronic.

## 2023-11-30 NOTE — ED Notes (Addendum)
 Pt feeling sluggish, slower to speak when asked to repeat things than when getting words out himself.  He feels and appears lethargic.  Sister says he is normally much more energetic when speaking.  Dr. Rhunette Croft notified.

## 2023-11-30 NOTE — H&P (Addendum)
 Hospital Admission History and Physical Service Pager: [phone]  Patient name: [name] [name] Medical record number: [id number] Date of Birth: May 12, 1971 Age: 53 y.o. Gender: male  Primary Care Provider: Christia Budds, MD Consultants: Nephrology  Code Status: Full Preferred Emergency Contact:  Contact Information     Name Relation Home Work Mobile   [name],[name] Mother [phone]  [phone]      Other Contacts   None on File    Chief Complaint: Hypotension  Confusion   Assessment and Plan: [name] [name] is a 53 y.o. male presenting with hypotension and confusion along with RUQ and epigastric pain.  Differential for hypotension/confusion includes: Medication Encephalopathy - likely due to being on a combination of Oxycodone  10 mg q4h as needed, Baclofen  mg 5 mg daily, Gabapentin  300 mg daily with history of ESRD on HD, with lack of HD this morning as scheduled. Of note, pupils are constricted. No longer hypotensive.  Metabolic Encephalopathy - No electrolyte abnormalities severe enough to cause AMS noted. Ammonia level not checked but low concern for hyperammoniemia given normal LFTs, no alcohol use, no liver pathology noted on CT and mental status has improved  Infectious etiology - could be contributing. Leukocytosis present along with new abdominal pain. Also with ground glass opacity on CXR though no concerning symptoms for URI Acute intracranial pathology -  unlikely as head CT negative  Differential for abdominal symptoms includes: Colitis - CT abd/pelvis demonstrates diffuse colonic wall thickening/edema of the ascending colon compatible with colitis. This along with his complaints of diarrhea over the course of the past three months. He does have leukocytosis concerning for infection, no fevers and VSS.  Pancreatitis - possible due to gallstones noted on imaging  however lipase is normal. No severe pain or distress with meals, but has complained of  N/V/D over the past few months Pancreatic cancer - 2.5 x 1.9 cm hypodense areas seen in the body of the pancreas, no history of rapid weight loss, but has new RUQ/epigastric pain along with N/V/D over the course of a few months that he describes as greasy/floating stools Cholelithiasis - seen on imaging as well + hx of N/V/D and pain Gastroenteritis - with hx of N/V/D, but no fevers or exposure to sick contacts.  Assessment & Plan Altered mental status Likely secondary to medication vs metabolic encephalopathy in setting of taking baclofen  and oxycodone  10 mg q4h prn with ESRD. Due for HD today, if medication vs metabolic this should clear with HD. He is currently A&O, just a little slow to speak but answering all questions fully and appropriately - Admit to FMTS, attending Dr. Donzetta - Med-Surg, Vital signs per floor - AM CBC/RFP - Monitor for changes in mental status - HD per nephrology - fall precautions - hold sedating medications Abdominal pain New onset pain in the RUQ/epigastric region after being examined today, however has had history of diarrhea (possibly steatorrhea) over the past few months and N/V for past 2 years. The differential is broad and listed above.  - Monitor abdominal pain and related symptoms - MRI abdomen w/ MRCP to work up pancreatic hypodense areas seen on CT - procalcitonin to assess the need for antibiotics for possibly infectious colitis - am CBC ESRD (end stage renal disease) on dialysis (HCC) HD TTS.  Nephrology on board, appreciate recommendations.  - HD tonight if able  Chronic and Stable Problems:  HTN: Continue home Coreg  12.5 mg BID, Cardizem  300 mg daily, and Hydralazine  50 mg BID  HFpEF: Last Echo in 03/2022, LVEF 60-65%, mild LVH.  Lasix  discontinued at discharge during last admission. 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 vsSSI ordered with CBGs qAC and qhs  FEN/GI: Renal diet VTE Prophylaxis: SQ heparin   Disposition: Med Tele   History of Present Illness:  [name] [name] is a 53 y.o. male presenting with confusion and hypotension today after noting that he took all of his nighttime medications later last night and then morning medications.  Reported to EMS that he took 2 Hydralazine  at 0500 and 0900, and he took his oxycodone  and baclofen  at 0900. His BP at HD was 85/51. He was alert but drowsy when he presented for HD earlier. Did not have HD. He feels dizzy, sluggish and slower to speak with occasional slurring of speech.  His sister reports that he is normally much more energetic when speaking.  Feels as if his confusion has improved however still feels like he is not at his baseline.  Patient also endorses some nausea and one episode of vomiting this morning but states he vomits almost daily for past 2 years. Notes RUQ and epigastric pain which started today after being examined by ER doc.  Also admits to some SOB earlier which has resolved.  Has had more frequent non bloody diarrhea over the past 3 months, occurs most days but not every day.  No fever or chills.   In the ED, Hgb 11.9, lipase normal, CXR negative, CTAP with diffuse colonic wall thickening/edema of the ascending colon compatible with colitis. CT head negative.  Was put on 2 L O2 in the ED however weaned to RA.  Review Of Systems: As above  Pertinent Past Medical History: ESRD on HD T2DM GERD HLD PAD History of necrotizing fasciitis Neuropathy MDD Chronic constipation Secondary hyperparathyroidism Remainder reviewed in history tab.   Pertinent Past Surgical History: R BKA in 2018 and revision in 2019 Left toe amputation Colonoscopy in 2020 EGD in 2022, 2024 Remainder reviewed in history tab.   Pertinent Social History: Tobacco use: Current user, 1/2 PPD Alcohol use: None Other  Substance use: None Lives with mother and sister.  History of APS.  Pertinent Family History: Diabetes, MDD, colonic polyps, HTN, HLD, heart disease in first-degree relatives Remainder reviewed in history tab.   Important Outpatient Medications: Atorvastatin  40 mg daily Baclofen  5 mg daily Calcitriol  0.25 mcg every Tuesday, Thursday, and Saturday with HD Carvedilol  12.5 mg BID Diltiazem  300 mg daily Fluoxetine  20 mg daily Gabapentin  300 mg daily Humalog  0 to 6 units SSI TID with meals Hydralazine  50 mg BID Ondansetron  8 mg PRN Oxycodone  10 mg PRN MiraLAX  34 g TID Senna 8.6 mg BID Sevelamer  Carbonate 800 mg 2 tabs TID with meals Tamsulosin  0.4 mg daily Vitamin D  1.25 mg (50,000 units) weekly  Remainder reviewed in medication history.   Objective: BP (!) 152/75   Pulse 68   Temp 97.9 F (36.6 C) (Oral)   Resp 17   SpO2 93%  Exam: General: Awake, sluggish, and in NAD. HEENT: NCAT. EOMI. Sclera anicteric. No rhinorrhea. Cardiovascular: RRR. No M/R/G Respiratory: Coarse breath sounds. Normal WOB on RA. Gastrointestinal: Soft, TTP primarily over RUQ/epigastric region, non-distended. Normoactive bowel sounds.  MSK: 2+ BLE edema Derm: LE ichthyosis. Warm and dry.  Psych: Stable mood. Neuro: AAOx4. CN II: Constricted pupils CN III, IV,VI: EOMI CV V:  Normal sensation in V1, V2, V3 CVII: Symmetric smile and brow raise CN VIII: Normal hearing CN XI: 4/5 shoulder shrug UE and LE strength 5/5 Normal sensation in UE and LE bilaterally   Labs:  CBC BMET  Recent Labs  Lab 11/30/23 1517  WBC 15.9*  HGB 11.9*  HCT 41.1  PLT 201   Recent Labs  Lab 11/30/23 1517  NA 138  K 3.8  CL 99  CO2 24  BUN 34*  CREATININE 7.02*  GLUCOSE 231*  CALCIUM  7.1*    Neutrophils: 13.9 Lipase: 29  EKG: Regular rate.  NSR.  Prolonged QTc.  Imaging Studies Performed: CXR: No active disease.  CT Abd/Pelvis: 1. Diffuse colonic wall thickening/edema of the ascending colon  compatible with colitis. 2. Cholelithiasis. 3. Stable left adrenal adenoma. 4. Trace bilateral pleural effusions. 5. Ground-glass opacities in the lung bases may represent edema or infection. 6. Aortic atherosclerosis. 7. 2.5 x 1.9 cm hypodense areas seen in the body of the pancreas. Further evaluation with MRI recommended.  CT Head: No acute intracranial abnormality.   Janna Ferrier, DO 11/30/2023, 10:32 PM PGY-1, [facility]  FPTS Intern pager: [phone], text pages welcome Secure chat group [facility] Teaching Service   I was personally present and re-performed the exam and medical decision making and verified the service and findings are accurately documented in Dr. Janna note.  Lauraine Molt, DO 11/30/2023 11:58 PM

## 2023-11-30 NOTE — Telephone Encounter (Signed)
 Brett Canales from [employer] calling for PT verbal orders as follows:  1 time(s) weekly for 4 week(s), then OT evaluation  Verbal orders given per [facility] protocol  Veronda Prude, RN

## 2023-11-30 NOTE — ED Provider Notes (Signed)
  Physical Exam  BP 125/71   Pulse 61   Temp 98.4 F (36.9 C) (Oral)   Resp 10   SpO2 93%   Physical Exam  Procedures  Procedures  ED Course / MDM    Medical Decision Making Amount and/or Complexity of Data Reviewed Labs: ordered. Radiology: ordered.  Risk Decision regarding hospitalization.   68M recently initiated dialysis, took all of his night meds late and then also took his morning meds. Was lightheaded and hypotensive, improved after fluids. Still mildly altered, neuro exam non-focal. Needs admission for obs, nephrology aware, on list for regularly scheduled dialysis. Family practice patient, needs admission for obs.   CT Abd Pel: IMPRESSION:  1. Diffuse colonic wall thickening/edema of the ascending colon  compatible with colitis.  2. Cholelithiasis.  3. Stable left adrenal adenoma.  4. Trace bilateral pleural effusions.  5. Ground-glass opacities in the lung bases may represent edema or  infection.  6. Aortic atherosclerosis.  7. 2.5 x 1.9 cm hypodense areas seen in the body of the pancreas.  Further evaluation with MRI recommended.    Aortic Atherosclerosis (ICD10-I70.0).   CT imaging showing a colitis, family practice medicine consulted for admission.     Jerrol Agent, MD 11/30/23 [phone]

## 2023-12-01 ENCOUNTER — Observation Stay (HOSPITAL_COMMUNITY): Payer: 59

## 2023-12-01 DIAGNOSIS — R400 Somnolence: Secondary | ICD-10-CM | POA: Diagnosis not present

## 2023-12-01 DIAGNOSIS — E785 Hyperlipidemia, unspecified: Secondary | ICD-10-CM | POA: Diagnosis not present

## 2023-12-01 DIAGNOSIS — N186 End stage renal disease: Secondary | ICD-10-CM | POA: Diagnosis not present

## 2023-12-01 DIAGNOSIS — R1011 Right upper quadrant pain: Secondary | ICD-10-CM | POA: Diagnosis not present

## 2023-12-01 DIAGNOSIS — I5030 Unspecified diastolic (congestive) heart failure: Secondary | ICD-10-CM | POA: Diagnosis not present

## 2023-12-01 DIAGNOSIS — I959 Hypotension, unspecified: Secondary | ICD-10-CM | POA: Diagnosis not present

## 2023-12-01 DIAGNOSIS — Z992 Dependence on renal dialysis: Secondary | ICD-10-CM | POA: Diagnosis not present

## 2023-12-01 DIAGNOSIS — R4182 Altered mental status, unspecified: Secondary | ICD-10-CM | POA: Diagnosis not present

## 2023-12-01 DIAGNOSIS — K8689 Other specified diseases of pancreas: Secondary | ICD-10-CM | POA: Diagnosis not present

## 2023-12-01 DIAGNOSIS — D3502 Benign neoplasm of left adrenal gland: Secondary | ICD-10-CM | POA: Diagnosis not present

## 2023-12-01 DIAGNOSIS — I132 Hypertensive heart and chronic kidney disease with heart failure and with stage 5 chronic kidney disease, or end stage renal disease: Secondary | ICD-10-CM | POA: Diagnosis not present

## 2023-12-01 DIAGNOSIS — R109 Unspecified abdominal pain: Secondary | ICD-10-CM | POA: Diagnosis not present

## 2023-12-01 DIAGNOSIS — E1140 Type 2 diabetes mellitus with diabetic neuropathy, unspecified: Secondary | ICD-10-CM | POA: Diagnosis not present

## 2023-12-01 DIAGNOSIS — E1122 Type 2 diabetes mellitus with diabetic chronic kidney disease: Secondary | ICD-10-CM | POA: Diagnosis not present

## 2023-12-01 DIAGNOSIS — Z79899 Other long term (current) drug therapy: Secondary | ICD-10-CM | POA: Diagnosis not present

## 2023-12-01 DIAGNOSIS — R079 Chest pain, unspecified: Secondary | ICD-10-CM

## 2023-12-01 DIAGNOSIS — K8020 Calculus of gallbladder without cholecystitis without obstruction: Secondary | ICD-10-CM | POA: Diagnosis not present

## 2023-12-01 DIAGNOSIS — Z794 Long term (current) use of insulin: Secondary | ICD-10-CM | POA: Diagnosis not present

## 2023-12-01 HISTORY — DX: Chest pain, unspecified: R07.9

## 2023-12-01 HISTORY — DX: Hypotension, unspecified: I95.9

## 2023-12-01 LAB — RENAL FUNCTION PANEL
Albumin: 2.3 g/dL — ABNORMAL LOW (ref 3.5–5.0)
Anion gap: 11 (ref 5–15)
BUN: 20 mg/dL (ref 6–20)
CO2: 29 mmol/L (ref 22–32)
Calcium: 7.3 mg/dL — ABNORMAL LOW (ref 8.9–10.3)
Chloride: 96 mmol/L — ABNORMAL LOW (ref 98–111)
Creatinine, Ser: 4.62 mg/dL — ABNORMAL HIGH (ref 0.61–1.24)
GFR, Estimated: 15 mL/min — ABNORMAL LOW (ref >60)
Glucose, Bld: 151 mg/dL — ABNORMAL HIGH (ref 70–99)
Phosphorus: 3.1 mg/dL (ref 2.5–4.6)
Potassium: 3.6 mmol/L (ref 3.5–5.1)
Sodium: 136 mmol/L (ref 135–145)

## 2023-12-01 LAB — CBC
HCT: 35.5 % — ABNORMAL LOW (ref 39.0–52.0)
Hemoglobin: 10.7 g/dL — ABNORMAL LOW (ref 13.0–17.0)
MCH: 26 pg (ref 26.0–34.0)
MCHC: 30.1 g/dL (ref 30.0–36.0)
MCV: 86.2 fL (ref 80.0–100.0)
Platelets: 172 10*3/uL (ref 150–400)
RBC: 4.12 MIL/uL — ABNORMAL LOW (ref 4.22–5.81)
RDW: 16.9 % — ABNORMAL HIGH (ref 11.5–15.5)
WBC: 10.2 10*3/uL (ref 4.0–10.5)
nRBC: 0 % (ref 0.0–0.2)

## 2023-12-01 LAB — GLUCOSE, CAPILLARY
Glucose-Capillary: 104 mg/dL — ABNORMAL HIGH (ref 70–99)
Glucose-Capillary: 124 mg/dL — ABNORMAL HIGH (ref 70–99)
Glucose-Capillary: 179 mg/dL — ABNORMAL HIGH (ref 70–99)
Glucose-Capillary: 147 mg/dL — ABNORMAL HIGH (ref 70–99)
Glucose-Capillary: 158 mg/dL — ABNORMAL HIGH (ref 70–99)

## 2023-12-01 LAB — TROPONIN I (HIGH SENSITIVITY)
Troponin I (High Sensitivity): 12 ng/L (ref <18)
Troponin I (High Sensitivity): 11 ng/L (ref <18)

## 2023-12-01 LAB — MRSA NEXT GEN BY PCR, NASAL: MRSA by PCR Next Gen: NOT DETECTED

## 2023-12-01 LAB — PROCALCITONIN: Procalcitonin: 0.21 ng/mL

## 2023-12-01 LAB — HEPATITIS B SURFACE ANTIBODY, QUANTITATIVE: Hep B S AB Quant (Post): <3.5 m[IU]/mL — ABNORMAL LOW

## 2023-12-01 MED ORDER — CALCITRIOL 0.25 MCG PO CAPS
0.2500 ug | ORAL_CAPSULE | ORAL | Status: DC
  Administered 2023-12-03: 0.25 ug via ORAL
  Filled 2023-12-01: qty 1

## 2023-12-01 MED ORDER — ACETAMINOPHEN 325 MG PO TABS
650.0000 mg | ORAL_TABLET | Freq: Four times a day (QID) | ORAL | Status: DC | PRN
  Filled 2023-12-01: qty 2

## 2023-12-01 MED ORDER — GABAPENTIN 300 MG PO CAPS
300.0000 mg | ORAL_CAPSULE | Freq: Every day | ORAL | Status: DC
  Administered 2023-12-01 – 2023-12-03 (×3): 300 mg via ORAL
  Filled 2023-12-01 (×3): qty 1

## 2023-12-01 MED ORDER — PROSOURCE PLUS PO LIQD
30.0000 mL | Freq: Two times a day (BID) | ORAL | Status: DC
  Administered 2023-12-01 – 2023-12-03 (×2): 30 mL via ORAL
  Filled 2023-12-01 (×3): qty 30

## 2023-12-01 NOTE — Discharge Instructions (Signed)
 Dear [name] [name],   Thank you for letting us  participate in your care! You were admitted to the hospital for low blood pressure and confusion after taking  two of your hydralazine . Your blood pressure and symptoms improved prior to your discharge.    POST-HOSPITAL & CARE INSTRUCTIONS We recommend following up with your PCP within 1 week from being discharged from the hospital. Please let PCP/Specialists know of any changes in medications that were made which you will be able to see in the medications section of this packet.  DOCTOR'S APPOINTMENTS & FOLLOW UP Future Appointments  Date Time Provider Department Center  12/17/2023 11:00 AM [facility] V[facility] 3 [facility]  12/17/2023 12:45 PM [facility] PA-2 [facility]     Thank you for choosing [facility] Take care and be well!  Family Medicine Teaching Service Inpatient Team   [facility]  [address] [place], [place] [address] [phone]

## 2023-12-01 NOTE — Plan of Care (Signed)
  Problem: Education: Goal: Ability to describe self-care measures that may prevent or decrease complications (Diabetes Survival Skills Education) will improve Outcome: Progressing   Problem: Skin Integrity: Goal: Risk for impaired skin integrity will decrease Outcome: Progressing   Problem: Nutritional: Goal: Maintenance of adequate nutrition will improve Outcome: Progressing

## 2023-12-01 NOTE — TOC CM/SW Note (Addendum)
 Transition of Care [facility]) - Inpatient Brief Assessment   Patient Details  Name: [name] [name] MRN: [id number] Date of Birth: [birth year]  Transition of Care [facility]) CM/SW Contact:    Tom-Johnson, Harvest Muskrat, RN Phone Number: 12/01/2023, 5:09 PM   Clinical Narrative:  Patient presented to the ED from his outpatient HD with Confusion and Hypotension. Has hx of  ESRD on TTS HD schedule, PAD s/p Rt BKA, T2DM, GERD, HLD, MDD. Admitted with AMS/Acute Encephalopathy. Nephrology following for inpatient HD.  From home with his mother and sister, has one supportive daughter. Not employed, on Disability. Able to drive self.  Has a cane, walker, Rt Leg prosthesis, w/c and shower seat at home.  Patient states he has transportation through [facility] to transport to and from HD. PCP is Christia Budds, MD and uses [facility] on [address]  Medicare Observation letter read to patient, patient declined signing stating MD told him there are more tests to be done concerning his Pancrease and he believes he will be Inpatient and not Observation.   No TOC needs or recommendations noted.  CM will continue to follow as patient progresses with care towards discharge.                  Transition of Care Asessment: Insurance and Status: Insurance coverage has been reviewed Patient has primary care physician: Yes Home environment has been reviewed: Yes Prior level of function:: Modified Independent Prior/Current Home Services: No current home services Social Drivers of Health Review: SDOH reviewed no interventions necessary Readmission risk has been reviewed: Yes Transition of care needs: no transition of care needs at this time

## 2023-12-01 NOTE — Assessment & Plan Note (Addendum)
 Reported atypical chest pain yesterday, however this has completely resolved. Low suspicion of ACS. EKG was obtained with possibly some atypical finding in II, III, AVF. Troponins normal. Repeat EKG nml.

## 2023-12-01 NOTE — Assessment & Plan Note (Addendum)
 No pain today but New onset pain in the RUQ/epigastric region after being examined on admission, however has had history of diarrhea (possibly steatorrhea) over the past few months and N/V for past 2 years. The differential is broad and listed above. procalcitonin nml - Monitor abdominal pain and related symptoms - MRI abdomen w/ MRCP to work up pancreatic hypodense areas seen on CT - am CBC

## 2023-12-01 NOTE — Evaluation (Signed)
 Occupational Therapy Evaluation Patient Details Name: [name] [name] MRN: [id number] DOB: 1971-09-30 Today's Date: 12/01/2023   History of Present Illness Pt is a 53 y.o. male presenting 1/7 from dialysis with AMS after taking 2 hydralazine . Head CT negative. MR abdomen with cholelithiasis, extensive ductal ectasia and side branch cystic dilation in the pancreas mildly progressed from 06/2022. Recent admission after fall with intertrochanteric femur fx s/p IM nailing 11/22 in which he went to [facility] for rehab afterward and had been at home for 3 days. PMH: DM2, HTN, peripheral neuropathy, ESRD on HD, ARF, PAD, R BKA, L toe amputaion   Clinical Impression   PTA, pt home from [facility] rehab for 3 days per his report in which he was able to perform his self care and medication management without assist and sister was assisting with cooking. Upon eval, pt flat in affect but agreeable to OT evaluation answering home set up questions [facility] and oriented. Upon sitting EOB, pt stating he doesn't know why he has to do this. Re-explained role of acute therapist in dc planning, reducing deconditioning, cog assessment. Pt reporting he is tired of having rehab but plans to have HH therapies upon return home? After education, donned prosthesis and L shoe, able to ambulate in room with up to CGA. Will follow as pt tolerates and agreeable.       If plan is discharge home, recommend the following: A little help with walking and/or transfers;A little help with bathing/dressing/bathroom;Assistance with cooking/housework;Assist for transportation;Help with stairs or ramp for entrance    Functional Status Assessment  Patient has had a recent decline in their functional status and demonstrates the ability to make significant improvements in function in a reasonable and predictable amount of time.  Equipment Recommendations  None recommended by OT    Recommendations for Other Services Speech consult (cog -- pt  seems frustrated with large quantities of therapy but maybe someone who doesnt ask him to mobilize could help him feel less frustration?)     Precautions / Restrictions Precautions Precautions: Fall Restrictions Weight Bearing Restrictions Per Provider Order: No      Mobility Bed Mobility Overal bed mobility: Modified Independent                  Transfers Overall transfer level: Needs assistance Equipment used: Rolling walker (2 wheels) Transfers: Sit to/from Stand Sit to Stand: Supervision           General transfer comment: for safety      Balance Overall balance assessment: Needs assistance Sitting-balance support: No upper extremity supported, Feet supported Sitting balance-Leahy Scale: Normal     Standing balance support: Bilateral upper extremity supported, During functional activity Standing balance-Leahy Scale: Poor Standing balance comment: reliant on RW                           ADL either performed or assessed with clinical judgement   ADL Overall ADL's : Needs assistance/impaired Eating/Feeding: Modified independent   Grooming: Set up;Sitting   Upper Body Bathing: Set up;Sitting   Lower Body Bathing: Supervison/ safety;Sit to/from stand   Upper Body Dressing : Set up;Sitting   Lower Body Dressing: Supervision/safety;Sit to/from stand   Toilet Transfer: Contact guard assist;Rolling walker (2 wheels);Ambulation;BSC/3in1           Functional mobility during ADLs: Contact guard assist;Rolling walker (2 wheels) General ADL Comments: approaching supervision     Vision Ability to See in Adequate Light: 0  Adequate Patient Visual Report: No change from baseline Additional Comments: not formally assessed; able to use phone at end of session [facility]     Perception         Praxis         Pertinent Vitals/Pain Pain Assessment Pain Assessment: Faces Faces Pain Scale: Hurts little more Pain Location: R knee Pain Descriptors  / Indicators: Aching Pain Intervention(s): Limited activity within patient's tolerance, Monitored during session     Extremity/Trunk Assessment Upper Extremity Assessment Upper Extremity Assessment: Generalized weakness   Lower Extremity Assessment Lower Extremity Assessment: Defer to PT evaluation   Cervical / Trunk Assessment Cervical / Trunk Assessment: Kyphotic   Communication Communication Communication: No apparent difficulties Cueing Techniques: Verbal cues   Cognition Arousal: Alert Behavior During Therapy: Flat affect Overall Cognitive Status: Impaired/Different from baseline Area of Impairment: Safety/judgement, Awareness, Problem solving, Memory                     Memory: Decreased short-term memory   Safety/Judgement: Decreased awareness of safety, Decreased awareness of deficits Awareness: Emergent, Anticipatory Problem Solving: Slow processing, Requires verbal cues General Comments: Very flat in affect with short answers throughout. RN reports this is consistent with how pt has been today. pt with decreased knowledge of need for therapy to identify further needs and poor awareness of any cognitive difficulties present. Oriented but secondary to agitation once mobility initiated further cognition not assessed. Able to sequence BADl tasks. Suspect decreased executive function. Will continue to assess. very limited conversation, but does follow commands [facility]     General Comments       Exercises     Shoulder Instructions      Home Living Family/patient expects to be discharged to:: Private residence Living Arrangements: Parent (and sister) Available Help at Discharge: Family;Available 24 hours/day (unsure level of asisst mother can provide but available 24hr/day) Type of Home: House Home Access: Stairs to enter [employer] of Steps: 3 Entrance Stairs-Rails: Left;Right;Can reach both Home Layout: One level     Bathroom Shower/Tub:  Chief Strategy Officer: Handicapped height     Home Equipment: Wheelchair - Forensic Psychologist (2 wheels);Grab bars - tub/shower;Hand held shower head;Cane - quad;BSC/3in1;Shower seat (per chart review tub bench, but today reports shower  seat, unsure accuract)          Prior Functioning/Environment Prior Level of Function : Needs assist             Mobility Comments: ambulatory in home with RW at home 3 days; prior to recent admission in November, was ambulating with cane ADLs Comments: Pt reports that proior to hip fx and surgery in November was independent and driving/grocery shopping but since femur fx and surgery in November has been having assist. pt reports able to perofrm self care without asisst in the 3 days he has been home from rehab        OT Problem List: Decreased strength;Decreased activity tolerance;Impaired balance (sitting and/or standing);Decreased cognition;Decreased safety awareness;Decreased knowledge of use of DME or AE      OT Treatment/Interventions: Self-care/ADL training;Therapeutic exercise;DME and/or AE instruction;Patient/family education;Balance training;Therapeutic activities;Cognitive remediation/compensation    OT Goals(Current goals can be found in the care plan section) Acute Rehab OT Goals Patient Stated Goal: none stated OT Goal Formulation: With patient Time For Goal Achievement: 12/15/23 Potential to Achieve Goals: Good  OT Frequency: Min 1X/week    Co-evaluation  AM-PAC OT 6 Clicks Daily Activity     Outcome Measure Help from another person eating meals?: None Help from another person taking care of personal grooming?: A Little Help from another person toileting, which includes using toliet, bedpan, or urinal?: A Little Help from another person bathing (including washing, rinsing, drying)?: A Little Help from another person to put on and taking off regular upper body clothing?: A Little Help from  another person to put on and taking off regular lower body clothing?: A Little 6 Click Score: 19   End of Session Equipment Utilized During Treatment: Rolling walker (2 wheels) Nurse Communication: Mobility status  Activity Tolerance: Patient tolerated treatment well Patient left: in bed;with call bell/phone within reach  OT Visit Diagnosis: Unsteadiness on feet (R26.81);Muscle weakness (generalized) (M62.81);Other symptoms and signs involving cognitive function                Time: 8371-8349 OT Time Calculation (min): 22 min Charges:  OT General Charges $OT Visit: 1 Visit OT Evaluation $OT Eval Moderate Complexity: 1 Mod  Elma JONETTA Lebron FREDERICK, OTR/L [facility] Acute Rehabilitation Office: [phone]   Elma JONETTA Lebron 12/01/2023, 5:25 PM

## 2023-12-01 NOTE — Hospital Course (Addendum)
 [name] [name] is a 53 y.o.male with a history of ESRD on HD, T2DM, GERD, HLD, PAD, necrotizing fasciitis, neuropathy, MDD, chronic constipation, secondary hyperparathyroidism who was admitted to the [facility] Medicine Teaching Service at [facility] for AMS. His hospital course is detailed below:  Altered mental status and hypotension Patient presented initially with confusion and hypotension noted at his dialysis appointment.  He had an abnormal sleep schedule causing him to take his medications too closely together, including his hydralazine , oxycodone , and baclofen .  The patient quickly returned to his baseline mental status and his blood pressure improved (to hypertensive) after IVF bolus.  This was suspected to be due to taking too much of his hydralazine .  By the time of discharge he was at his baseline.    Abdominal pain Patient with some abdominal pain on admission. Of note has a history of recurrent vomiting and diarrhea. 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 Abdominal pain improved by discharge.  Chest pain Endorsed chest pain on admission. EKG with possible ST changes in leads II, III, avF. Troponins normal, repeat EKG normal. Chest pain resolved by discharge.    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 (How much urine does he produce?) Med rec: consider continuing to hold oxycodone  and baclofen   Was initially to discharge 1/9 but unable to coordinate dialysis - experienced HTN on 1/9 because his HTN meds were held by nursing staff all day in anticipation of him receiving  dialysis. BP returned to baseline prior to discharge.

## 2023-12-01 NOTE — Assessment & Plan Note (Addendum)
 HD TTS.  Nephrology on board, appreciate recommendations.  - HD tonight if able

## 2023-12-01 NOTE — Progress Notes (Signed)
 [facility] Progress Note   Subjective:   Patient seen in room - s/p dialysis yesterday. Will receive dialysis TTS. Patient reports feeling clearer today and states his mental status has improved since yesterday. He reports continued RUQ pain. He denies N/V today. He denies CP/SOB.   Objective Vitals:   12/01/23 0315 12/01/23 0346 12/01/23 0348 12/01/23 0639  BP: (!) 187/93 (!) 189/87  (!) 171/90  Pulse: 68 70    Resp: (!) 28 20    Temp: 98.2 F (36.8 C) 98 F (36.7 C)    TempSrc: Oral Oral    SpO2: 96% 98%    Weight:   101 kg   Height:   6' 5 (1.956 m)    Physical Exam General: Well appearing male in NAD. Laying in bed comfortably.  Heart:RRR, no M/R/G Lungs: CTAB Abdomen: tender to palpation RUQ. Soft and non-distended.  Extremities: 1+ LE edema. Moving extremities well compared to yesterday.  Dialysis Access: Right internal jugular TDC + maturing LUE AVF (unable to appreciate bruit/thrill)   Additional Objective Labs: Basic Metabolic Panel: Recent Labs  Lab 11/30/23 1517 12/01/23 0444  NA 138 136  K 3.8 3.6  CL 99 96*  CO2 24 29  GLUCOSE 231* 151*  BUN 34* 20  CREATININE 7.02* 4.62*  CALCIUM  7.1* 7.3*  PHOS  --  3.1   Liver Function Tests: Recent Labs  Lab 11/30/23 1517 12/01/23 0444  AST 14*  --   ALT 7  --   ALKPHOS 87  --   BILITOT 0.5  --   PROT 5.0*  --   ALBUMIN  2.2* 2.3*   Recent Labs  Lab 11/30/23 1517  LIPASE 29   CBC: Recent Labs  Lab 11/30/23 1517 12/01/23 0444  WBC 15.9* 10.2  NEUTROABS 13.9*  --   HGB 11.9* 10.7*  HCT 41.1 35.5*  MCV 89.5 86.2  PLT 201 172   CBG: Recent Labs  Lab 12/01/23 0349 12/01/23 0710 12/01/23 1116  GLUCAP 104* 124* 179*    CT HEAD WO CONTRAST ( ) Result Date: 11/30/2023 CLINICAL DATA:  Mental status changes, confusion EXAM: CT HEAD WITHOUT CONTRAST TECHNIQUE: Contiguous axial images were obtained from the base of the skull through the vertex without intravenous contrast.  RADIATION DOSE REDUCTION: This exam was performed according to the departmental dose-optimization program which includes automated exposure control, adjustment of the mA and/or kV according to patient size and/or use of iterative reconstruction technique. COMPARISON:  10/14/2023 FINDINGS: Brain: No acute intracranial abnormality. Specifically, no hemorrhage, hydrocephalus, mass lesion, acute infarction, or significant intracranial injury. Vascular: No hyperdense vessel or unexpected calcification. Skull: No acute calvarial abnormality. Sinuses/Orbits: No acute findings Other: None IMPRESSION: No acute intracranial abnormality. Electronically Signed   By: Franky Crease M.D.   On: 11/30/2023 20:02   CT ABDOMEN PELVIS WO CONTRAST Result Date: 11/30/2023 CLINICAL DATA:  Right upper quadrant pain.  Epigastric pain. EXAM: CT ABDOMEN AND PELVIS WITHOUT CONTRAST TECHNIQUE: Multidetector CT imaging of the abdomen and pelvis was performed following the standard protocol without IV contrast. RADIATION DOSE REDUCTION: This exam was performed according to the departmental dose-optimization program which includes automated exposure control, adjustment of the mA and/or kV according to patient size and/or use of iterative reconstruction technique. COMPARISON:  CT abdomen and pelvis 10/13/2023. FINDINGS: Lower chest: There are trace bilateral pleural effusions. There are ground-glass opacities in the lung bases. Hepatobiliary: Gallstones are present. There is no biliary ductal dilatation. Liver is within normal limits. Pancreas: 2.5 x  1.9 cm hypodense areas seen in the body of the pancreas. No acute inflammation or ductal dilatation. Spleen: Within normal limits. Adrenals/Urinary Tract: Low-density left adrenal nodule measures 4.2 x 1.6 cm and appears unchanged compatible with adenoma. Right adrenal gland is within normal limits. Kidneys and bladder are within normal limits. Stomach/Bowel: There is diffuse colonic wall  thickening/edema of the ascending colon without surrounding inflammation. No dilated bowel loops, pneumatosis or free air. The appendix is within normal limits. There is sigmoid colon diverticulosis. Small bowel and stomach are within normal limits. Vascular/Lymphatic: Aortic atherosclerosis. No enlarged abdominal or pelvic lymph nodes. Reproductive: Prostate is unremarkable. Other: There is no ascites.  There is mild diffuse body wall edema. Musculoskeletal: No acute osseous abnormality. Degenerative changes affect the spine. Right hip screw is present. IMPRESSION: 1. Diffuse colonic wall thickening/edema of the ascending colon compatible with colitis. 2. Cholelithiasis. 3. Stable left adrenal adenoma. 4. Trace bilateral pleural effusions. 5. Ground-glass opacities in the lung bases may represent edema or infection. 6. Aortic atherosclerosis. 7. 2.5 x 1.9 cm hypodense areas seen in the body of the pancreas. Further evaluation with MRI recommended. Aortic Atherosclerosis (ICD10-I70.0). Electronically Signed   By: Greig Pique M.D.   On: 11/30/2023 17:05   DG Chest Port 1 View Result Date: 11/30/2023 CLINICAL DATA:  Hypotension, dizziness. EXAM: PORTABLE CHEST 1 VIEW COMPARISON:  February 15, 2023. FINDINGS: Stable cardiomediastinal silhouette. No acute pulmonary disease is noted. Right internal jugular dialysis catheter is noted with distal tip in expected position of right atrium. Bony thorax is unremarkable. IMPRESSION: No active disease. Electronically Signed   By: Lynwood Landy Raddle M.D.   On: 11/30/2023 16:13   Medications:   atorvastatin   40 mg Oral QHS   carvedilol   12.5 mg Oral BID WC   Chlorhexidine  Gluconate Cloth  6 each Topical Q0600   diltiazem   300 mg Oral Daily   FLUoxetine   20 mg Oral Daily   heparin   5,000 Units Subcutaneous Q8H   hydrALAZINE   50 mg Oral BID   insulin  aspart  0-6 Units Subcutaneous TID WC    Dialysis Orders: [facility] . Right TDC; left BC AV fistula  11/19/23 415 H, 400/600, EDW 104 kg, 2.0 K/ 2.5 Ca, 2000 U heparin  Mircera 150 mcg IV Q2W; last dose 11/16/23 Calcitriol  0.25 mcg PO Q Dialysis   Assessment/Plan: Altered mental status/ encephalopathy: Head CT negative. Oxy, Baclofen , Gabapentin  held and patient dialyzed yesterday with improvement in mental status today. Would recommend holding these medications indefinitely. CT abdomen with gallstones, possible colitis, and pancreatic? mass. Will defer evaluation to primary team.   ESRD:  HD yesterday. TTS schedule.  Hypertension/volume: Elevated BP today. Continue Carvedilol  12.5 mg BID and Hydralazine  50mg  BID. He is below prior EDW, but with high BP will challenge down further with next HD. Anemia: Hgb 10.7. Hold ESA d/t stable Hgb Metabolic bone disease: Ca ok. Phos slightly low. Is taking Calcitriol  0.25 mcg PO at dialysis. Not on binders currently.  Nutrition: Albumin  2.3. Adding supps.  T2DM: insulin  per primary PAD: s/p right BKA  Signe Sick, PA-S Izetta Boehringer, PA-C 12/01/2023, 11:28 AM   [facility]

## 2023-12-01 NOTE — Progress Notes (Signed)
   12/01/23 0315  Vitals  Temp 98.2 F (36.8 C)  Temp Source Oral  BP (!) 187/93  MAP (mmHg) 121  BP Location Right Arm  BP Method Automatic  Patient Position (if appropriate) Lying  Pulse Rate 68  Pulse Rate Source Monitor  ECG Heart Rate 70  Resp (!) 28  Oxygen Therapy  SpO2 96 %  O2 Device Room Air  Patient Activity (if Appropriate) In bed  Pulse Oximetry Type Continuous  During Treatment Monitoring  Duration of HD Treatment -hour(s) 3 hour(s)  Cumulative Fluid Removed (mL) per Treatment  2000.18  Post Treatment  Dialyzer Clearance Lightly streaked  Hemodialysis Intake (mL) 0 mL  Liters Processed 72  Fluid Removed (mL) 2000 mL  Tolerated HD Treatment Yes  Post-Hemodialysis Comments Treatment completed and blood returned without issue.  Note  Patient Observations Patient alert, no c/o voiced, no acute distress noted; patient condition stable upon this procedure discharge.  Hemodialysis Catheter Right Internal jugular Double lumen Permanent (Tunneled)  Placement Date/Time: 10/15/23 1500   Serial / Lot #: [id number]  Expiration Date: 05/30/28  Time Out: Correct patient;Correct site;Correct procedure  Maximum sterile barrier precautions: Hand hygiene;Cap;Mask;Sterile gown;Sterile gloves;Large sterile ...  Site Condition No complications  Blue Lumen Status Flushed;Heparin  locked;Dead end cap in place  Red Lumen Status Flushed;Heparin  locked;Dead end cap in place  Purple Lumen Status N/A  Catheter fill solution Heparin  1000 units/ml  Catheter fill volume (Arterial) 1.9 cc  Catheter fill volume (Venous) 1.9  Dressing Type Transparent  Dressing Status Antimicrobial disc in place;Clean, Dry, Intact  Interventions Dressing changed  Drainage Description None  Post treatment catheter status Capped and Clamped

## 2023-12-01 NOTE — Progress Notes (Signed)
 Daily Progress Note Intern Pager: [phone]  Patient name: [name] [name] Medical record number: [id number] Date of birth: 1971-07-09 Age: 53 y.o. Gender: male  Primary Care Provider: Christia Budds, MD Consultants: nephrology Code Status: Full  Pt Overview and Major Events to Date:  1/7 - admitted  Assessment and Plan:  53 y.o. male presenting with hypotension and confusion along with RUQ and epigastric pain thought to be potentially medication induced.  Pending MRCP Assessment & Plan Altered mental status Improved, Likely secondary to hypotension in setting of taking extra hydralazine , baclofen  and oxycodone  10 mg q4h prn with ESRD. Due for HD, if medication vs metabolic this should clear with HD. He is currently A&O, just a little slow to speak but answering all questions fully and appropriately. Will need to clarify how often he is taking opiates and potentially monitor for withdrawal. - AM CBC/RFP - Monitor for changes in mental status - HD per nephrology - fall precautions - hold sedating medications Abdominal pain No pain today but New onset pain in the RUQ/epigastric region after being examined on admission, however has had history of diarrhea (possibly steatorrhea) over the past few months and N/V for past 2 years. The differential is broad and listed above. procalcitonin nml - Monitor abdominal pain and related symptoms - MRI abdomen w/ MRCP to work up pancreatic hypodense areas seen on CT - am CBC ESRD (end stage renal disease) on dialysis (HCC) HD TTS.  Nephrology on board, appreciate recommendations.  - HD tonight if able Chest pain Reported atypical chest pain yesterday, however this has completely resolved. Low suspicion of ACS. EKG was obtained with possibly some atypical finding in II, III, AVF. Troponins normal. Repeat EKG nml.   Chronic and Stable Problems:  ESRD on HD T2DM GERD HLD PAD History of necrotizing fasciitis Neuropathy MDD Chronic  constipation Secondary hyperparathyroidism   FEN/GI: renal PPx: heparin  Dispo:Home pending clinical improvement   Subjective:  NAEON, no complaints at this time, abd pain only with palpation, no chest pain, confusion/dizziness resolved  Objective: Temp:  [97.9 F (36.6 C)-98.5 F (36.9 C)] 98 F (36.7 C) (01/08 0346) Pulse Rate:  [57-79] 70 (01/08 0346) Resp:  [0-28] 20 (01/08 0346) BP: (125-189)/(67-99) 171/90 (01/08 0639) SpO2:  [88 %-100 %] 98 % (01/08 0346) Weight:  [101 kg] 101 kg (01/08 0348) Physical Exam: General: resting comfortably, NAD Cardiovascular: RRR, no m/r/g Respiratory: normal work of breathing on RA, CTAB Abdomen: Normal bowel sounds, soft, mildly TTP RUQ/epigastric  Extremities: RLE BKA. LLE with overlying chronic skin change, no swelling or TTP  Laboratory: Most recent CBC Lab Results  Component Value Date   WBC 10.2 12/01/2023   HGB 10.7 (L) 12/01/2023   HCT 35.5 (L) 12/01/2023   MCV 86.2 12/01/2023   PLT 172 12/01/2023   Most recent BMP    Latest Ref Rng & Units 12/01/2023    4:44 AM  BMP  Glucose 70 - 99 mg/dL 848   BUN 6 - 20 mg/dL 20   Creatinine 9.38 - 1.24 mg/dL 5.37   Sodium 864 - 854 mmol/L 136   Potassium 3.5 - 5.1 mmol/L 3.6   Chloride 98 - 111 mmol/L 96   CO2 22 - 32 mmol/L 29   Calcium  8.9 - 10.3 mg/dL 7.3    Procal 9.78  Imaging/Diagnostic Tests: CXR 11/30/23 IMPRESSION: No active disease.  CT A/P WO Contrast 11/30/23 IMPRESSION: 1. Diffuse colonic wall thickening/edema of the ascending colon compatible with colitis. 2. Cholelithiasis.  3. Stable left adrenal adenoma. 4. Trace bilateral pleural effusions. 5. Ground-glass opacities in the lung bases may represent edema or infection. 6. Aortic atherosclerosis. 7. 2.5 x 1.9 cm hypodense areas seen in the body of the pancreas. Further evaluation with MRI recommended.  CT Head WO Contrast 1/7;25 IMPRESSION: No acute intracranial abnormality.  Alphonsine Minium, Elyce,  DO 12/01/2023, 12:49 PM  PGY-1, [facility] FPTS Intern pager: [phone], text pages welcome Secure chat group [facility] Teaching Service

## 2023-12-01 NOTE — Assessment & Plan Note (Addendum)
 Improved, Likely secondary to hypotension in setting of taking extra hydralazine , baclofen  and oxycodone  10 mg q4h prn with ESRD. Due for HD, if medication vs metabolic this should clear with HD. He is currently A&O, just a little slow to speak but answering all questions fully and appropriately. Will need to clarify how often he is taking opiates and potentially monitor for withdrawal. - AM CBC/RFP - Monitor for changes in mental status - HD per nephrology - fall precautions - hold sedating medications

## 2023-12-02 ENCOUNTER — Other Ambulatory Visit: Payer: Self-pay

## 2023-12-02 ENCOUNTER — Encounter (HOSPITAL_COMMUNITY): Payer: Self-pay | Admitting: Family Medicine

## 2023-12-02 DIAGNOSIS — N186 End stage renal disease: Secondary | ICD-10-CM

## 2023-12-02 DIAGNOSIS — R404 Transient alteration of awareness: Secondary | ICD-10-CM | POA: Diagnosis not present

## 2023-12-02 DIAGNOSIS — Z992 Dependence on renal dialysis: Secondary | ICD-10-CM | POA: Diagnosis not present

## 2023-12-02 DIAGNOSIS — I952 Hypotension due to drugs: Secondary | ICD-10-CM | POA: Diagnosis not present

## 2023-12-02 LAB — RENAL FUNCTION PANEL
Albumin: 2 g/dL — ABNORMAL LOW (ref 3.5–5.0)
Anion gap: 9 (ref 5–15)
BUN: 30 mg/dL — ABNORMAL HIGH (ref 6–20)
CO2: 27 mmol/L (ref 22–32)
Calcium: 6.7 mg/dL — ABNORMAL LOW (ref 8.9–10.3)
Chloride: 100 mmol/L (ref 98–111)
Creatinine, Ser: 6.34 mg/dL — ABNORMAL HIGH (ref 0.61–1.24)
GFR, Estimated: 10 mL/min — ABNORMAL LOW (ref >60)
Glucose, Bld: 144 mg/dL — ABNORMAL HIGH (ref 70–99)
Phosphorus: 4.9 mg/dL — ABNORMAL HIGH (ref 2.5–4.6)
Potassium: 4 mmol/L (ref 3.5–5.1)
Sodium: 136 mmol/L (ref 135–145)

## 2023-12-02 LAB — GLUCOSE, CAPILLARY
Glucose-Capillary: 121 mg/dL — ABNORMAL HIGH (ref 70–99)
Glucose-Capillary: 249 mg/dL — ABNORMAL HIGH (ref 70–99)
Glucose-Capillary: 112 mg/dL — ABNORMAL HIGH (ref 70–99)
Glucose-Capillary: 157 mg/dL — ABNORMAL HIGH (ref 70–99)

## 2023-12-02 NOTE — Progress Notes (Signed)
 Contacted by staff regarding pt needing HD today and pt for d/c today. Contacted [facility] to inquire if clinic could treat pt today if pt arrives by 1:00. Clinic is able to treat pt today if arrives by 1:00. Spoke to pt at bedside to inquire if pt has transportation to clinic and then home. Pt states he does not have transportation and has mobility issues. Case discussed with RN CM. There are no transportation options for today since pt does not have his own w/c. Clinic aware pt will not be able to arrive by 1:00 today. Inquired of clinic if appt was available for tomorrow. Navigator advised that there are no appts for tomorrow but if something becomes available clinic will contact navigator. Update provided to attending, nephrologist, renal NP, pt's RN, and RN CM.   Randine Mungo Renal Navigator [phone]

## 2023-12-02 NOTE — Progress Notes (Signed)
     Daily Progress Note Intern Pager: [phone]  Patient name: [name] [name] Medical record number: [id number] Date of birth: [birth year] Age: 53 y.o. Gender: male  Primary Care Provider: Christia Budds, MD Consultants: none Code Status: Full  Pt Overview and Major Events to Date:  12/01/23 - admitted  Assessment and Plan:  53 y.o. male with PMHx presenting with hypotension and confusion along with RUQ and epigastric pain thought to be potentially medication induced.  Will be stable for discharge after dialysis.  Assessment & Plan Altered mental status Resolved, Likely secondary to hypotension in setting of taking extra hydralazine , baclofen  and oxycodone  10 mg q4h prn with ESRD. - Monitor for changes in mental status - HD per nephrology - fall precautions ESRD (end stage renal disease) on dialysis (HCC) HD TTS.  Nephrology on board, appreciate recommendations.  - HD tonight if able Abdominal pain (Resolved: 12/02/2023) No pain today but New onset pain in the RUQ/epigastric region after being examined on admission, however has had history of diarrhea (possibly steatorrhea) over the past few months and N/V for past 2 years. procalcitonin nml. MR w/ MRCP with potential side branch IPMN, will need outpatient follow up of this, no acute findings. - Monitor abdominal pain and related symptoms - MRI abdomen w/ MRCP to work up pancreatic hypodense areas seen on CT - am CBC Chest pain (Resolved: 12/02/2023) Reported atypical chest pain yesterday, however this has completely resolved. Low suspicion of ACS. EKG was obtained with possibly some atypical finding in II, III, AVF. Troponins normal. Repeat EKG nml.     Chronic and Stable Problems:  ESRD on HD T2DM GERD HLD PAD History of necrotizing fasciitis Neuropathy MDD Chronic constipation Secondary hyperparathyroidism   FEN/GI: renal PPx: heparin  Dispo:Home today. Barriers include dialysis.   Subjective:  NAEON, no  complaints, sx have seemed to resolve  Objective: Temp:  [97.8 F (36.6 C)-98.5 F (36.9 C)] 97.8 F (36.6 C) (01/09 0746) Pulse Rate:  [66-75] 66 (01/09 0746) Resp:  [16-18] 16 (01/09 0746) BP: (151-178)/(78-99) 178/87 (01/09 0746) SpO2:  [96 %-98 %] 96 % (01/09 0746) Physical Exam: General: well appearing, NAD Cardiovascular: RRR, no m/r/g Respiratory: normal work of breathing on RA, CTAB Abdomen: Normal bowel sounds, soft, non-tender Extremities: RLE BKA. Overlying icthyosis vulgaris LLE, no swelling, calf TTP or warmth A&Ox4  Laboratory: Most recent CBC Lab Results  Component Value Date   WBC 10.2 12/01/2023   HGB 10.7 (L) 12/01/2023   HCT 35.5 (L) 12/01/2023   MCV 86.2 12/01/2023   PLT 172 12/01/2023   Most recent BMP    Latest Ref Rng & Units 12/02/2023    5:06 AM  BMP  Glucose 70 - 99 mg/dL 855   BUN 6 - 20 mg/dL 30   Creatinine 9.38 - 1.24 mg/dL 3.65   Sodium 864 - 854 mmol/L 136   Potassium 3.5 - 5.1 mmol/L 4.0   Chloride 98 - 111 mmol/L 100   CO2 22 - 32 mmol/L 27   Calcium  8.9 - 10.3 mg/dL 6.7     Verlyn Dannenberg, DO 12/02/2023, 8:01 AM  PGY-1, [facility] FPTS Intern pager: [phone], text pages welcome Secure chat group [facility] Teaching Service

## 2023-12-02 NOTE — Progress Notes (Signed)
 PT Cancellation Note  Patient Details Name: [name] [name] MRN: [id number] DOB: 08-11-1971   Cancelled Treatment:    Reason Eval/Treat Not Completed: Other (comment)  Patient on the phone. Will return.    [name] [name], PT Acute Rehabilitation Services  Office [phone]  [name] [name] 12/02/2023, 8:39 AM

## 2023-12-02 NOTE — Assessment & Plan Note (Addendum)
 Reported atypical chest pain yesterday, however this has completely resolved. Low suspicion of ACS. EKG was obtained with possibly some atypical finding in II, III, AVF. Troponins normal. Repeat EKG nml.

## 2023-12-02 NOTE — Assessment & Plan Note (Addendum)
 Resolved, Likely secondary to hypotension in setting of taking extra hydralazine, baclofen and oxycodone 10 mg q4h prn with ESRD. - Monitor for changes in mental status - HD per nephrology - fall precautions

## 2023-12-02 NOTE — Progress Notes (Signed)
 [facility] Progress Note   Subjective:   Seen in room this morning. He states that he got a little bit of sleep last night, which was a relief. His mental status seems to be much better than on admission. He denies and chest pain or dyspnea. He reports that he is still having some RUQ abdominal pain. He denies N/V/D. HD this afternoon.   Objective Vitals:   12/01/23 1618 12/01/23 2109 12/02/23 0549 12/02/23 0746  BP: (!) 151/78 (!) 169/99 (!) 162/80 (!) 178/87  Pulse: 70 75 67 66  Resp: 18 18 18 16   Temp: 98.2 F (36.8 C) 97.9 F (36.6 C) 98.5 F (36.9 C) 97.8 F (36.6 C)  TempSrc: Oral Oral  Oral  SpO2: 97% 98% 98% 96%  Weight:      Height:       Physical Exam General: Pleasant man, in no acute distress Heart:RRR, no murmur Lungs: CTA, on room air, no wheezing, rhales, or rhonchi Abdomen: tender RUQ to palpation, soft non-distended, no N/V/D, + bowel sounds Extremities: 1+ LLE, RLE BKA Dialysis Access: Right internal jugular TDC, right LUE AVF -b/t  Additional Objective Labs: Basic Metabolic Panel: Recent Labs  Lab 11/30/23 1517 12/01/23 0444 12/02/23 0506  NA 138 136 136  K 3.8 3.6 4.0  CL 99 96* 100  CO2 24 29 27   GLUCOSE 231* 151* 144*  BUN 34* 20 30*  CREATININE 7.02* 4.62* 6.34*  CALCIUM  7.1* 7.3* 6.7*  PHOS  --  3.1 4.9*   Liver Function Tests: Recent Labs  Lab 11/30/23 1517 12/01/23 0444 12/02/23 0506  AST 14*  --   --   ALT 7  --   --   ALKPHOS 87  --   --   BILITOT 0.5  --   --   PROT 5.0*  --   --   ALBUMIN  2.2* 2.3* 2.0*   Recent Labs  Lab 11/30/23 1517  LIPASE 29   CBC: Recent Labs  Lab 11/30/23 1517 12/01/23 0444  WBC 15.9* 10.2  NEUTROABS 13.9*  --   HGB 11.9* 10.7*  HCT 41.1 35.5*  MCV 89.5 86.2  PLT 201 172   CBG: Recent Labs  Lab 12/01/23 0710 12/01/23 1116 12/01/23 1622 12/01/23 2111 12/02/23 0747  GLUCAP 124* 179* 147* 158* 121*    Medications:   (feeding supplement) PROSource Plus  30 mL  Oral BID BM   atorvastatin   40 mg Oral QHS   calcitRIOL   0.25 mcg Oral Q T,Th,Sa-HD   carvedilol   12.5 mg Oral BID WC   Chlorhexidine  Gluconate Cloth  6 each Topical Q0600   diltiazem   300 mg Oral Daily   FLUoxetine   20 mg Oral Daily   gabapentin   300 mg Oral Daily   heparin   5,000 Units Subcutaneous Q8H   hydrALAZINE   50 mg Oral BID   insulin  aspart  0-6 Units Subcutaneous TID WC    [facility] . Right TDC; left BC AV fistula 11/19/23 415 H, 400/600, EDW 104 kg, 2.0 K/ 2.5 Ca, 2000 U heparin  Mircera 150 mcg IV Q2W; last dose 11/16/23 Calcitriol  0.25 mcg PO Q Dialysis  Assessment/Plan: RUQ pain: had MRI yesterday, cholelithiasis without evidence cholecystitis, benign left adrenal adenoma, side branch IPMN, follow up MRI in 6 months Altered mental status: head CT negative. Was on multiple medications that could have been etiology. Improvement of mental status post HD.   ESRD: HD today, continue with normal HD schedule HTN/volume:  BP high today, below EDW. Plan for 2.5-3 L UF today. Will adjust on discharge. Continue carvedilol , dilt, and hydralazine  for BP. Anemia of ESRD: Hgb stable today. ESA on hold for now. Last given 11/16/23 Secondary HPTH: Corrected Ca is 8.3. Phos 4.9.  PO Calcitriol  0.25 mcg on HD days.  Nutrition: Albumin  is low. Continue with supplements and current diet. Dispo: home pending clinical improvement per family medicine   Belvie Och, AGNP 12/02/2023, 8:56 AM  [facility]

## 2023-12-02 NOTE — Assessment & Plan Note (Addendum)
 No pain today but New onset pain in the RUQ/epigastric region after being examined on admission, however has had history of diarrhea (possibly steatorrhea) over the past few months and N/V for past 2 years. procalcitonin nml. MR w/ MRCP with potential side branch IPMN, will need outpatient follow up of this, no acute findings. - Monitor abdominal pain and related symptoms - MRI abdomen w/ MRCP to work up pancreatic hypodense areas seen on CT - am CBC

## 2023-12-02 NOTE — Plan of Care (Signed)
   Problem: Health Behavior/Discharge Planning: Goal: Ability to manage health-related needs will improve Outcome: Progressing

## 2023-12-02 NOTE — Evaluation (Signed)
 Physical Therapy Evaluation Patient Details Name: [name] [name] MRN: [id number] DOB: June 24, 1971 Today's Date: 12/02/2023  History of Present Illness  Pt is a 53 y.o. male presenting 1/7 from dialysis with AMS after taking 2 hydralazine . Head CT negative. MR abdomen with cholelithiasis, extensive ductal ectasia and side branch cystic dilation in the pancreas mildly progressed from 06/2022. Recent admission after fall with intertrochanteric femur fx s/p IM nailing 11/22 in which he went to [facility] for rehab afterward and had been at home for 3 days. PMH: DM2, HTN, peripheral neuropathy, ESRD on HD, ARF, PAD, R BKA, L toe amputaion  Clinical Impression   Pt admitted secondary to problem above with deficits below. PTA patient was home from SNF x 3 days. Reports he was mostly using his wheelchair inside home due to pain in rt knee with ambulation.  Pt currently requires CGA for ambulation with rW x 35 ft (refused incr distance due to knee pain).  Anticipate patient will benefit from PT to address problems listed below. Will continue to follow acutely to maximize functional mobility independence and safety.           If plan is discharge home, recommend the following: A little help with walking and/or transfers;Assistance with cooking/housework;Assist for transportation;Help with stairs or ramp for entrance   Can travel by private vehicle        Equipment Recommendations None recommended by PT  Recommendations for Other Services       Functional Status Assessment Patient has had a recent decline in their functional status and demonstrates the ability to make significant improvements in function in a reasonable and predictable amount of time.     Precautions / Restrictions Precautions Precautions: Fall Restrictions Weight Bearing Restrictions Per Provider Order: No      Mobility  Bed Mobility Overal bed mobility: Modified Independent                  Transfers Overall  transfer level: Needs assistance Equipment used: Rolling walker (2 wheels) Transfers: Sit to/from Stand Sit to Stand: Supervision           General transfer comment: for safety    Ambulation/Gait Ambulation/Gait assistance: Contact guard assist Gait Distance (Feet): 35 Feet Assistive device: Rolling walker (2 wheels) Gait Pattern/deviations: Step-through pattern, Decreased stride length, Trunk flexed       General Gait Details: pt reports pain in rt knee with ambulation; reports pain is what limits his distance  Stairs            Wheelchair Mobility     Tilt Bed    Modified Rankin (Stroke Patients Only)       Balance Overall balance assessment: Needs assistance Sitting-balance support: No upper extremity supported, Feet supported Sitting balance-Leahy Scale: Normal     Standing balance support: Bilateral upper extremity supported, During functional activity Standing balance-Leahy Scale: Poor Standing balance comment: reliant on RW                             Pertinent Vitals/Pain Pain Assessment Pain Assessment: Faces Faces Pain Scale: Hurts even more Pain Location: R knee Pain Descriptors / Indicators: Aching Pain Intervention(s): Limited activity within patient's tolerance, Monitored during session    Home Living Family/patient expects to be discharged to:: Private residence Living Arrangements: Parent (and sister) Available Help at Discharge: Family;Available 24 hours/day (unsure level of asisst mother can provide but available 24hr/day) Type of Home: House Home Access:  Stairs to enter Entrance Stairs-Rails: Left;Right;Can reach both Secretary/administrator of Steps: 3   Home Layout: One level Home Equipment: Wheelchair - Forensic Psychologist (2 wheels);Grab bars - tub/shower;Hand held shower head;Cane - quad;BSC/3in1;Shower seat (per chart review tub bench, but today reports shower  seat, unsure accuract)      Prior Function Prior  Level of Function : Needs assist             Mobility Comments: ambulatory in home with RW at home 3 days; prior to recent admission in November, was ambulating with cane ADLs Comments: Pt reports that proior to hip fx and surgery in November was independent and driving/grocery shopping but since femur fx and surgery in November has been having assist. pt reports able to perofrm self care without asisst in the 3 days he has been home from rehab     Extremity/Trunk Assessment   Upper Extremity Assessment Upper Extremity Assessment: Defer to OT evaluation    Lower Extremity Assessment Lower Extremity Assessment: RLE deficits/detail;Generalized weakness RLE Deficits / Details: Rt BKA with prosthesis present; pt able to don/doff without assist    Cervical / Trunk Assessment Cervical / Trunk Assessment: Kyphotic  Communication   Communication Communication: No apparent difficulties  Cognition Arousal: Alert Behavior During Therapy: Flat affect Overall Cognitive Status: No family/caregiver present to determine baseline cognitive functioning                                 General Comments: Followed all simple commands; able to don prosthesis and shoe independently. Very few verbalizations        General Comments      Exercises     Assessment/Plan    PT Assessment Patient needs continued PT services  PT Problem List Decreased strength;Decreased activity tolerance;Decreased balance;Decreased mobility;Pain       PT Treatment Interventions DME instruction;Gait training;Stair training;Functional mobility training;Therapeutic activities;Therapeutic exercise;Patient/family education    PT Goals (Current goals can be found in the Care Plan section)  Acute Rehab PT Goals Patient Stated Goal: improve his walking PT Goal Formulation: With patient Time For Goal Achievement: 12/16/23 Potential to Achieve Goals: Good    Frequency Min 1X/week     Co-evaluation                AM-PAC PT 6 Clicks Mobility  Outcome Measure Help needed turning from your back to your side while in a flat bed without using bedrails?: None Help needed moving from lying on your back to sitting on the side of a flat bed without using bedrails?: None Help needed moving to and from a bed to a chair (including a wheelchair)?: A Little Help needed standing up from a chair using your arms (e.g., wheelchair or bedside chair)?: A Little Help needed to walk in hospital room?: A Little Help needed climbing 3-5 steps with a railing? : A Little 6 Click Score: 20    End of Session Equipment Utilized During Treatment: Gait belt Activity Tolerance: Patient limited by pain Patient left: in bed;with call bell/phone within reach (refused to sit up in recliner) Nurse Communication: Mobility status PT Visit Diagnosis: Other abnormalities of gait and mobility (R26.89);Pain Pain - Right/Left: Right Pain - part of body: Knee    Time: 0907-0920 PT Time Calculation (min) (ACUTE ONLY): 13 min   Charges:   PT Evaluation $PT Eval Low Complexity: 1 Low   PT General Charges $$ ACUTE PT VISIT:  1 Visit          Macario RAMAN, PT Acute Rehabilitation Services  Office [phone]   Macario SHAUNNA Soja 12/02/2023, 9:37 AM

## 2023-12-02 NOTE — Discharge Summary (Signed)
 Family Medicine Teaching [facility] Discharge Summary  Patient name: [name] [name] Medical record number: [id number] Date of birth: [birth year] Age: 53 y.o. Gender: male Date of Admission: 11/30/2023  Date of Discharge: 12/02/23 Admitting Physician: Kathrine Melena, DO  Primary Care Provider: Christia Budds, MD Consultants: nephrology  Indication for Hospitalization: AMS, hypotension  Discharge Diagnoses/Problem List:  Principal Problem for Admission: AMS Other Problems addressed during stay:  Principal Problem:   Altered mental status Active Problems:   ESRD (end stage renal disease) on dialysis [facility])   Hypotension    Brief Hospital Course:  [name] is a 53 y.o.male with a history of ESRD on HD, T2DM, GERD, HLD, PAD, necrotizing fasciitis, neuropathy, MDD, chronic constipation, secondary hyperparathyroidism who was admitted to the [facility] Medicine Teaching Service at [facility] for AMS. His hospital course is detailed below:  Altered mental status and hypotension Patient presented initially with confusion and hypotension noted at his dialysis appointment.  He had an abnormal sleep schedule causing him to take his medications too closely together, including his hydralazine , oxycodone , and baclofen .  The patient quickly returned to his baseline mental status and his blood pressure improved (to hypertensive) after IVF bolus.  This was suspected to be due to taking too much of his hydralazine .  By the time of discharge he was at his baseline.    Abdominal pain Patient with some abdominal pain on admission. Of note has a history of recurrent vomiting and diarrhea. 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 Abdominal pain improved by discharge.  Chest pain Endorsed chest pain on admission. EKG with possible ST  changes in leads II, III, avF. Troponins normal, repeat EKG normal. Chest pain resolved by discharge.    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 (How much urine does he produce?) Med rec: consider continuing to hold oxycodone  and baclofen    Disposition: home  Discharge Condition: stable  Discharge Exam:  Vitals:   12/02/23 0549 12/02/23 0746  BP: (!) 162/80 (!) 178/87  Pulse: 67 66  Resp: 18 16  Temp: 98.5 F (36.9 C) 97.8 F (36.6 C)  SpO2: 98% 96%   General: well appearing, NAD Cardiovascular: RRR, no m/r/g Respiratory: normal work of breathing on RA, CTAB Abdomen: Normal bowel sounds, soft, non-tender Extremities: RLE BKA. Overlying icthyosis vulgaris LLE, no swelling, calf TTP or warmth A&Ox4  Significant Procedures: none  Significant Labs and Imaging:  Recent Labs  Lab 11/30/23 1517 12/01/23 0444  WBC 15.9* 10.2  HGB 11.9* 10.7*  HCT 41.1 35.5*  PLT 201 172   Recent Labs  Lab 11/30/23 1517 12/01/23 0444 12/02/23 0506  NA 138 136 136  K 3.8 3.6 4.0  CL 99 96* 100  CO2 24 29 27   GLUCOSE 231* 151* 144*  BUN 34* 20 30*  CREATININE 7.02* 4.62* 6.34*  CALCIUM  7.1* 7.3* 6.7*  PHOS  --  3.1 4.9*  ALKPHOS 87  --   --   AST 14*  --   --   ALT 7  --   --   ALBUMIN  2.2* 2.3* 2.0*   CXR 11/30/23  IMPRESSION: No active disease.  CTAP WO Contrast 11/30/23 IMPRESSION: 1. Diffuse colonic wall thickening/edema of the ascending colon compatible with colitis. 2. Cholelithiasis. 3. Stable left adrenal adenoma. 4. Trace bilateral pleural effusions. 5. Ground-glass opacities in the lung bases may represent edema or infection. 6. Aortic atherosclerosis. 7. 2.5 x 1.9 cm hypodense areas seen in the body of the  pancreas. Further evaluation with MRI recommended.  CT Head WO Contrast 11/30/23 IMPRESSION: No acute intracranial abnormality.  MRCP WO Contrast 12/01/23 IMPRESSION: 1. Cholelithiasis without evidence cholecystitis. 2. No biliary duct dilatation. 3. Extensive ductal ectasia and side branch cystic dilatation in the tail the pancreas. Findings are similar to mildly progressed from chest MRI 07/03/2022. 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 Recommend follow-up MRI with contrast in 6 months. This recommendation follows ACR consensus guidelines: Management of Incidental Pancreatic Cysts: A White Paper of the ACR Incidental Findings Committee. J Am Coll Radiol 2017;14:911-923. 4. Benign LEFT adrenal adenoma. 5. Small bilateral pleural effusions.  Results/Tests Pending at Time of Discharge: none  Discharge Medications:  Allergies as of 12/02/2023       Reactions   Iron  Nausea And Vomiting   With the iron  tablets   Tape Rash   Rash at site of tape--okay to use paper tape        Medication List     STOP taking these medications    baclofen  10 MG tablet Commonly known as: LIORESAL    metoprolol  succinate 25 MG 24 hr tablet Commonly known as: TOPROL -XL   ondansetron  8 MG disintegrating tablet Commonly known as: ZOFRAN -ODT   Oxycodone  HCl 10 MG Tabs       TAKE these medications    Accu-Chek Guide Me w/Device Kit See admin instructions.   acetaminophen  500 MG tablet Commonly known as: TYLENOL  Take 2 tablets (1,000 mg total) by mouth every 6 (six) hours as needed.   AgaMatrix Ultra-Thin Lancets Misc Check blood sugars before meals twice daily   atorvastatin  40 MG tablet Commonly known as: LIPITOR Take 1 tablet (40 mg total) by mouth at bedtime. What changed: when to take this   calcitRIOL  0.25 MCG capsule Commonly known as: ROCALTROL  Take 1 capsule (0.25 mcg total) by mouth Every Tuesday,Thursday,and Saturday with dialysis.   carvedilol  12.5 MG  tablet Commonly known as: COREG  Take 12.5 mg by mouth 2 (two) times daily with a meal.   Darbepoetin Alfa  150 MCG/0.3ML Sosy injection Commonly known as: ARANESP  Inject 0.3 mLs (150 mcg total) into the skin every Saturday at 6 PM.   diclofenac  Sodium 1 % Gel Commonly known as: Voltaren  Apply 4 g topically 4 (four) times daily.   diltiazem  300 MG 24 hr capsule Commonly known as: CARDIZEM  CD Take 1 capsule (300 mg total) by mouth daily.   FLUoxetine  20 MG capsule Commonly known as: PROZAC  Take 1 capsule (20 mg total) by mouth daily.   gabapentin  300 MG capsule Commonly known as: NEURONTIN  Take 1 capsule (300 mg total) by mouth daily.   glucose blood test strip 1 each by Other route as needed for other. Use as instructed   glucose blood test strip Commonly known as: AgaMatrix Presto Test Check blood sugars twice daily before meals   HumaLOG  100 UNIT/ML injection Generic drug: insulin  lispro 0-6 Units, Subcutaneous, 3 times daily with meals, First dose on Sun 10/17/23 at 0800 Correction coverage: Very Sensitive (ESRD/Dialysis) CBG < 70: Implement Hypoglycemia Standing Orders and refer to Hypoglycemia Standing Orders sidebar report CBG 70 - 120:  0 units CBG 121 - 150: 0 units CBG 151 - 200: 1 unit CBG 201-250: 2 units CBG 251-300: 3 units CBG 301-350: 4 units CBG 351-400: 5 units CBG > 400: Give 6 units and call MD   hydrALAZINE  50 MG tablet Commonly known as: APRESOLINE  Take 1 tablet (50 mg total) by mouth 2 (two) times daily.   lidocaine  4 % Commonly known as: HM Lidocaine  Patch Place 1 patch onto the skin daily.   Omnipod DASH Pods (Gen 4) Misc   polyethylene glycol 17 g packet Commonly known as: MIRALAX  / GLYCOLAX  Mix as directed and take 34 g by mouth 3 (three) times daily.   senna 8.6 MG Tabs tablet Commonly known as: SENOKOT Take 1 tablet (8.6 mg total) by mouth 2 (two) times daily.   sevelamer  carbonate 800 MG tablet Commonly known as: RENVELA  Take  2 tablets (1,600 mg total) by mouth 3 (three) times daily with meals.   tamsulosin  0.4 MG Caps capsule Commonly known as: FLOMAX  Take 1 capsule (0.4 mg total) by mouth daily after supper.   Vitamin D  (Ergocalciferol ) 1.25 MG (50000 UNIT) Caps capsule Commonly known as: DRISDOL  Take 1 capsule (50,000 Units total) by mouth every 7 (seven) days.        Discharge Instructions: Please refer to Patient Instructions section of EMR for full details.  Patient was counseled important signs and symptoms that should prompt return to medical care, changes in medications, dietary instructions, activity restrictions, and follow up appointments.   Follow-Up Appointments: 12/13/23 with Dr. Christia Prescott, Mell Guia, DO 12/02/2023, 11:40 AM PGY-1, [facility] Family Medicine

## 2023-12-02 NOTE — Assessment & Plan Note (Addendum)
 HD TTS.  Nephrology on board, appreciate recommendations.  - HD tonight if able

## 2023-12-02 NOTE — Progress Notes (Signed)
 Occupational Therapy Treatment and Discharge Patient Details Name: BARTON WANT MRN: [id number] DOB: [birth year] Today's Date: 12/02/2023   History of present illness Pt is a 53 y.o. male presenting 1/7 from dialysis with AMS after taking 2 hydralazine . Head CT negative. MR abdomen with cholelithiasis, extensive ductal ectasia and side branch cystic dilation in the pancreas mildly progressed from 06/2022. Recent admission after fall with intertrochanteric femur fx s/p IM nailing 11/22 in which he went to [facility] for rehab afterward and had been at home for 3 days. PMH: DM2, HTN, peripheral neuropathy, ESRD on HD, ARF, PAD, R BKA, L toe amputaion   OT comments  This 53 yo male seen today for pillbox test--which he passed without issues. Pt also given aging [employer] brochure for him to look into if he wishes for home modifications. No further OT needs, we will sign off.      If plan is discharge home, recommend the following:  A little help with walking and/or transfers;A little help with bathing/dressing/bathroom;Assistance with cooking/housework;Assist for transportation (based on how he did with PT eariler today)   Equipment Recommendations  None recommended by OT       Precautions / Restrictions Precautions Precautions: Fall Restrictions Weight Bearing Restrictions Per Provider Order: No                ADL  Pt reports he does not feel there will be any issues with basic ADLs when he returns home.                                            Extremity/Trunk Assessment Upper Extremity Assessment Upper Extremity Assessment:  (has flattening of arches of both hands)       Vision Patient Visual Report: No change from baseline            Cognition Arousal: Alert Behavior During Therapy: Flat affect Overall Cognitive Status: No family/caregiver present to determine baseline cognitive functioning                                  General Comments: Able to complete pill box test with increased time for reading directions but did it without A/VCs. Told me he takes about 10 pills a day (per chart it appears he takes 9-11)-- some in morning and some in evening.                   Pertinent Vitals/ Pain       Pain Assessment Pain Assessment: Faces Faces Pain Scale: Hurts little more Pain Location: R knee and back Pain Descriptors / Indicators: Aching, Sore Pain Intervention(s): Limited activity within patient's tolerance, Monitored during session  Home Living Family/patient expects to be discharged to:: Private residence Living Arrangements: Parent (and sister) Available Help at Discharge: Family;Available 24 hours/day (unsure level of asisst mother can provide but available 24hr/day) Type of Home: House Home Access: Stairs to enter [employer] of Steps: 3 Entrance Stairs-Rails: Left;Right;Can reach both Home Layout: One level     Bathroom Shower/Tub: Chief Strategy Officer: Handicapped height     Home Equipment: Wheelchair - Forensic Psychologist (2 wheels);Grab bars - tub/shower;Hand held shower head;Cane - quad;BSC/3in1;Shower seat (per chart review tub bench, but today reports shower  seat, unsure accuract)  Frequency  Min 1X/week        Progress Toward Goals  OT Goals(current goals can now be found in the care plan section)  Progress towards OT goals: Goals met/education completed, patient discharged from OT  Acute Rehab OT Goals Patient Stated Goal: get back home         AM-PAC OT 6 Clicks Daily Activity     Outcome Measure   Help from another person eating meals?: None Help from another person taking care of personal grooming?: A Little Help from another person toileting, which includes using toliet, bedpan, or urinal?: A Little Help from another person bathing (including washing, rinsing, drying)?: A Little Help from another person to put  on and taking off regular upper body clothing?: A Little Help from another person to put on and taking off regular lower body clothing?: A Little 6 Click Score: 19    End of Session Equipment Utilized During Treatment:  (none--not OOB today since only goal was for pill box test)      Activity Tolerance Patient tolerated treatment well   Patient Left in bed;with call bell/phone within reach;with bed alarm set           Time: 8895-8884 OT Time Calculation (min): 11 min  Charges: OT General Charges $OT Visit: 1 Visit OT Treatments $Self Care/Home Management : 8-22 mins  Donny BECKER OT Acute Rehabilitation Services Office [phone]    [place] Dorothyann Distel 12/02/2023, 11:45 AM

## 2023-12-03 DIAGNOSIS — R404 Transient alteration of awareness: Secondary | ICD-10-CM | POA: Diagnosis not present

## 2023-12-03 DIAGNOSIS — N186 End stage renal disease: Secondary | ICD-10-CM | POA: Diagnosis not present

## 2023-12-03 DIAGNOSIS — I952 Hypotension due to drugs: Secondary | ICD-10-CM | POA: Diagnosis not present

## 2023-12-03 DIAGNOSIS — Z992 Dependence on renal dialysis: Secondary | ICD-10-CM | POA: Diagnosis not present

## 2023-12-03 LAB — RENAL FUNCTION PANEL
Albumin: 2 g/dL — ABNORMAL LOW (ref 3.5–5.0)
Anion gap: 8 (ref 5–15)
BUN: 24 mg/dL — ABNORMAL HIGH (ref 6–20)
CO2: 26 mmol/L (ref 22–32)
Calcium: 7 mg/dL — ABNORMAL LOW (ref 8.9–10.3)
Chloride: 103 mmol/L (ref 98–111)
Creatinine, Ser: 5.02 mg/dL — ABNORMAL HIGH (ref 0.61–1.24)
GFR, Estimated: 13 mL/min — ABNORMAL LOW (ref >60)
Glucose, Bld: 144 mg/dL — ABNORMAL HIGH (ref 70–99)
Phosphorus: 3.3 mg/dL (ref 2.5–4.6)
Potassium: 4.5 mmol/L (ref 3.5–5.1)
Sodium: 137 mmol/L (ref 135–145)

## 2023-12-03 LAB — GLUCOSE, CAPILLARY
Glucose-Capillary: 112 mg/dL — ABNORMAL HIGH (ref 70–99)
Glucose-Capillary: 166 mg/dL — ABNORMAL HIGH (ref 70–99)

## 2023-12-03 MED ORDER — HEPARIN SODIUM (PORCINE) 1000 UNIT/ML IJ SOLN
INTRAMUSCULAR | Status: AC
  Filled 2023-12-03: qty 4

## 2023-12-03 NOTE — Discharge Summary (Addendum)
 Family Medicine Teaching [facility] Discharge Summary  Patient name: [name] [name] Medical record number: [id number] Date of birth: 1971/09/07 Age: 53 y.o. Gender: male Date of Admission: 11/30/2023  Date of Discharge: 12/03/23 Admitting Physician: Kathrine Melena, DO  Primary Care Provider: Christia Budds, MD Consultants: nephrology  Indication for Hospitalization: altered mental status req monitoring  Discharge Diagnoses/Problem List:  Principal Problem for Admission: AMS Other Problems addressed during stay:  Principal Problem:   Altered mental status Active Problems:   ESRD (end stage renal disease) on dialysis [facility])   Hypotension    Brief Hospital Course:  [name] [name] is a 53 y.o.male with a history of ESRD on HD, T2DM, GERD, HLD, PAD, necrotizing fasciitis, neuropathy, MDD, chronic constipation, secondary hyperparathyroidism who was admitted to the [facility] Medicine Teaching Service at [facility] for AMS. His hospital course is detailed below:  Altered mental status and hypotension Patient presented initially with confusion and hypotension noted at his dialysis appointment.  He had an abnormal sleep schedule causing him to take his medications too closely together, including his hydralazine , oxycodone , and baclofen .  The patient quickly returned to his baseline mental status and his blood pressure improved (to hypertensive) after IVF bolus.  This was suspected to be due to taking too much of his hydralazine .  By the time of discharge he was at his baseline.    Abdominal pain Patient with some abdominal pain on admission. Of note has a history of recurrent vomiting and diarrhea. 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 Abdominal pain improved by discharge.  Chest pain Endorsed chest pain on admission. EKG  with possible ST changes in leads II, III, avF. Troponins normal, repeat EKG normal. Chest pain resolved by discharge.    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 (How much urine does he produce?) Med rec: consider continuing to hold oxycodone  and baclofen   Was initially to discharge 1/9 but unable to coordinate dialysis - experienced HTN on 1/9 because his HTN meds were held by nursing staff all day in anticipation of him receiving dialysis. BP returned to baseline prior to discharge.   Disposition: home  Discharge Condition: stable  Discharge Exam:  Vitals:   12/03/23 0449 12/03/23 0759  BP: (!) 188/94 (!) 186/100  Pulse: 76 76  Resp: 20   Temp: 98.5 F (36.9 C) 97.9 F (36.6 C)  SpO2: 98% 98%   General: well appearing, NAD, resting comfortably Cardiovascular: RRR, no m/r/g Respiratory: normal work of breathing on RA, CTAB Abdomen: Normal bowel sounds, soft, non-tender, non-distended Extremities: RLE BKA. Overlying icthyosis vulgaris LLE, no swelling, calf TTP or warmth, unchanged exam A&Ox4  Significant Procedures: none  Significant Labs and Imaging:  No results for input(s): WBC, HGB, HCT, PLT in the last 48 hours. Recent Labs  Lab 12/02/23 0506 12/03/23 0703  NA 136 137  K 4.0 4.5  CL 100 103  CO2 27 26  GLUCOSE 144* 144*  BUN 30* 24*  CREATININE 6.34* 5.02*  CALCIUM  6.7* 7.0*  PHOS 4.9* 3.3  ALBUMIN  2.0* 2.0*   CXR 11/30/23 IMPRESSION: No active disease.  CTAP WO Contrast 11/30/23 IMPRESSION: 1. Diffuse colonic wall thickening/edema of the ascending colon compatible with colitis. 2. Cholelithiasis. 3. Stable left adrenal adenoma. 4. Trace bilateral pleural effusions. 5. Ground-glass  opacities in the lung bases may represent edema or infection. 6. Aortic atherosclerosis. 7. 2.5 x 1.9 cm hypodense areas seen in the body of the pancreas. Further evaluation with MRI recommended.   CT Head WO Contrast 11/30/23 IMPRESSION: No acute intracranial abnormality.   MRCP WO Contrast 12/01/23 IMPRESSION: 1. Cholelithiasis without evidence cholecystitis. 2. No biliary duct dilatation. 3. Extensive ductal ectasia and side branch cystic dilatation in the tail the pancreas. Findings are similar to mildly progressed from chest MRI 07/03/2022. 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 Recommend follow-up MRI with contrast in 6 months. This recommendation follows ACR consensus guidelines: Management of Incidental Pancreatic Cysts: A White Paper of the ACR Incidental Findings Committee. J Am Coll Radiol 2017;14:911-923. 4. Benign LEFT adrenal adenoma. 5. Small bilateral pleural effusions.    Results/Tests Pending at Time of Discharge: none  Discharge Medications:  Allergies as of 12/03/2023       Reactions   Iron  Nausea And Vomiting   With the iron  tablets   Tape Rash   Rash at site of tape--okay to use paper tape        Medication List     STOP taking these medications    baclofen  10 MG tablet Commonly known as: LIORESAL    metoprolol  succinate 25 MG 24 hr tablet Commonly known as: TOPROL -XL   ondansetron  8 MG disintegrating tablet Commonly known as: ZOFRAN -ODT   Oxycodone  HCl 10 MG Tabs       TAKE these medications    Accu-Chek Guide Me w/Device Kit See admin instructions.   acetaminophen  500 MG tablet Commonly known as: TYLENOL  Take 2 tablets (1,000 mg total) by mouth every 6 (six) hours as needed.   AgaMatrix Ultra-Thin Lancets Misc Check blood sugars before meals twice daily   atorvastatin  40 MG tablet Commonly known as: LIPITOR Take 1 tablet (40 mg total) by mouth at bedtime. What changed: when to take this   calcitRIOL  0.25 MCG  capsule Commonly known as: ROCALTROL  Take 1 capsule (0.25 mcg total) by mouth Every Tuesday,Thursday,and Saturday with dialysis.   carvedilol  12.5 MG tablet Commonly known as: COREG  Take 12.5 mg by mouth 2 (two) times daily with a meal.   Darbepoetin Alfa  150 MCG/0.3ML Sosy injection Commonly known as: ARANESP  Inject 0.3 mLs (150 mcg total) into the skin every Saturday at 6 PM.   diclofenac  Sodium 1 % Gel Commonly known as: Voltaren  Apply 4 g topically 4 (four) times daily.   diltiazem  300 MG 24 hr capsule Commonly known as: CARDIZEM  CD Take 1 capsule (300 mg total) by mouth daily.   FLUoxetine  20 MG capsule Commonly known as: PROZAC  Take 1 capsule (20 mg total) by mouth daily.   gabapentin  300 MG capsule Commonly known as: NEURONTIN  Take 1 capsule (300 mg total) by mouth daily.   glucose blood test strip 1 each by Other route as needed for other. Use as instructed   glucose blood test strip Commonly known as: AgaMatrix Presto Test Check blood sugars twice daily before meals   HumaLOG  100 UNIT/ML injection Generic drug: insulin  lispro 0-6 Units, Subcutaneous, 3 times daily with meals, First dose on Sun 10/17/23 at 0800 Correction coverage: Very Sensitive (ESRD/Dialysis) CBG < 70: Implement Hypoglycemia Standing Orders and refer to Hypoglycemia Standing Orders sidebar report CBG 70 - 120: 0  units CBG 121 - 150: 0 units CBG 151 - 200: 1 unit CBG 201-250: 2 units CBG 251-300: 3 units CBG 301-350: 4 units CBG 351-400: 5 units CBG > 400: Give 6 units and call MD   hydrALAZINE  50 MG tablet Commonly known as: APRESOLINE  Take 1 tablet (50 mg total) by mouth 2 (two) times daily.   lidocaine  4 % Commonly known as: HM Lidocaine  Patch Place 1 patch onto the skin daily.   Omnipod DASH Pods (Gen 4) Misc   polyethylene glycol 17 g packet Commonly known as: MIRALAX  / GLYCOLAX  Mix as directed and take 34 g by mouth 3 (three) times daily.   senna 8.6 MG Tabs  tablet Commonly known as: SENOKOT Take 1 tablet (8.6 mg total) by mouth 2 (two) times daily.   sevelamer  carbonate 800 MG tablet Commonly known as: RENVELA  Take 2 tablets (1,600 mg total) by mouth 3 (three) times daily with meals.   tamsulosin  0.4 MG Caps capsule Commonly known as: FLOMAX  Take 1 capsule (0.4 mg total) by mouth daily after supper.   Vitamin D  (Ergocalciferol ) 1.25 MG (50000 UNIT) Caps capsule Commonly known as: DRISDOL  Take 1 capsule (50,000 Units total) by mouth every 7 (seven) days.        Discharge Instructions: Please refer to Patient Instructions section of EMR for full details.  Patient was counseled important signs and symptoms that should prompt return to medical care, changes in medications, dietary instructions, activity restrictions, and follow up appointments.   Follow-Up Appointments:  Follow-up Information     Christia Budds, MD Follow up on 12/13/2023.   Specialty: Family Medicine Why: 2:50 PM arrive 15 minutes early Contact information: [address] [place] [address] [phone]               12/13/23 with Dr. Christia Prescott, Kirstie, DO 12/03/2023, 11:24 AM PGY-1, [facility] Family [facility]  Upper Level Addendum: Reviewed the above note, making necessary revisions as appropriate. I agree with the medical decision making and physical exam as noted above. Gladis Church, DO PGY-2 [facility] Family Medicine Residency

## 2023-12-03 NOTE — Progress Notes (Signed)
  [facility] Progress Note   Subjective:   Patient seen in room this morning. His BP was elevated overnight. He received his Coreg  at 0300 and got his normal BP medications when I saw him. No complaints despite BP. Denies SOB, dyspnea, CP this morning. He had some questions about bathing with his catheter when he gets home. Plan for d/c today to his home. Will want his SBP to be below 160 before discharge.   Objective Vitals:   12/03/23 0225 12/03/23 0333 12/03/23 0449 12/03/23 0759  BP: (!) 209/98 (!) 208/102 (!) 188/94 (!) 186/100  Pulse: 81 78 76 76  Resp:  20 20   Temp: (!) 97.5 F (36.4 C) (!) 97.5 F (36.4 C) 98.5 F (36.9 C) 97.9 F (36.6 C)  TempSrc: Oral  Oral Oral  SpO2: 99% 98% 98% 98%  Weight:      Height:       Physical Exam  General: Pleasant man, in no acute distress Heart:RRR, no murmur Lungs: CTA, on room air, no wheezing, rhales, or rhonchi Abdomen: tender RUQ to palpation, soft non-distended, no N/V/D, + bowel sounds Extremities: 1+ LLE, RLE BKA Dialysis Access: Right internal jugular TDC, right LUE AVF -b/t  Additional Objective Labs: Basic Metabolic Panel: Recent Labs  Lab 12/01/23 0444 12/02/23 0506 12/03/23 0703  NA 136 136 137  K 3.6 4.0 4.5  CL 96* 100 103  CO2 29 27 26   GLUCOSE 151* 144* 144*  BUN 20 30* 24*  CREATININE 4.62* 6.34* 5.02*  CALCIUM  7.3* 6.7* 7.0*  PHOS 3.1 4.9* 3.3   Liver Function Tests: Recent Labs  Lab 11/30/23 1517 12/01/23 0444 12/02/23 0506 12/03/23 0703  AST 14*  --   --   --   ALT 7  --   --   --   ALKPHOS 87  --   --   --   BILITOT 0.5  --   --   --   PROT 5.0*  --   --   --   ALBUMIN  2.2* 2.3* 2.0* 2.0*   Recent Labs  Lab 11/30/23 1517  LIPASE 29   CBC: Recent Labs  Lab 11/30/23 1517 12/01/23 0444  WBC 15.9* 10.2  NEUTROABS 13.9*  --   HGB 11.9* 10.7*  HCT 41.1 35.5*  MCV 89.5 86.2  PLT 201 172    CBG: Recent Labs  Lab 12/02/23 0747 12/02/23 1140 12/02/23 1706  12/02/23 2117 12/03/23 0758  GLUCAP 121* 249* 112* 157* 112*    Medications:   (feeding supplement) PROSource Plus  30 mL Oral BID BM   atorvastatin   40 mg Oral QHS   calcitRIOL   0.25 mcg Oral Q T,Th,Sa-HD   carvedilol   12.5 mg Oral BID WC   Chlorhexidine  Gluconate Cloth  6 each Topical Q0600   diltiazem   300 mg Oral Daily   FLUoxetine   20 mg Oral Daily   gabapentin   300 mg Oral Daily   heparin   5,000 Units Subcutaneous Q8H   heparin  sodium (porcine)       hydrALAZINE   50 mg Oral BID   insulin  aspart  0-6 Units Subcutaneous TID WC    Dialysis Orders [facility] Car  on TTS . Right TDC; left BC AV fistula 11/19/23 415 H, 400/600, EDW 104 kg, 2.0 K/ 2.5 Ca, 2000 U heparin  Mircera 150 mcg IV Q2W; last dose 11/16/23 Calcitriol  0.25 mcg PO Q Dialysis  Assessment/Plan: RUQ pain: had MRI yesterday, cholelithiasis without evidence cholecystitis, benign left adrenal adenoma, side  branch IPMN, follow up MRI in 6 months  Altered mental status: head CT negative. Was on multiple medications that could have been etiology. Improved mental status post HD. ESRD: HD yesterday. Continue with normal HD schedule TTS HTN/volume: BP high overnight despite having HD. Below EDW. Goal is EDW of 100 kg on discharge. Continue BP medications. Ideal BP <160 SBP before dc.  Anemia of ESRD: Hgb 10.7 today. Minor drop overnight. Hold ESA. Last given 11/16/23 Secondary HPTH: correct Ca and Phos good today. PO Calcitriol  0.25 on HD days. Nutrition: Albumin  2.0. Continue supplements and current diet. He ate 100% of his breakfast plate this morning.  Dispo: plan is for d/c home today per family medicine   Belvie Och, AGNP 12/03/2023, 9:30 AM  [employer]

## 2023-12-03 NOTE — Discharge Planning (Signed)
 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  HD ORDER CHANGES: Heparin  change: no EDW Change: YES New EDW: 100kg Bath Change: no  ANEMIA MANAGEMENT: Aranesp : Given: no    ESA dose for discharge: mircera 50 mcg IV q 2 weeks, to start this week IV Iron  dose at discharge: per protocol Transfusion: Given: no  BONE/MINERAL MEDICATIONS: Hectorol/Calcitriol  change: no Sensipar/Parsabiv change: no  ACCESS INTERVENTION/CHANGE: no Details:  RECENT LABS: Recent Labs  Lab 12/01/23 0444 12/02/23 0506 12/03/23 0703  HGB 10.7*  --   --   NA 136   < > 137  K 3.6   < > 4.5  CALCIUM  7.3*   < > 7.0*  PHOS 3.1   < > 3.3  ALBUMIN  2.3*   < > 2.0*   < > = values in this interval not displayed.    IV ANTIBIOTICS: no Details:  OTHER ANTICOAGULATION: On Coumadin ?: no  OTHER/APPTS/LAB ORDERS:  - Will need repeat abd MRI in 6 mo  - Continue to lower EDW as tolerated  - Have recommended to stop oxycodone , baclofen  indefinitely. D/w patient if having benefit from gabapentin . If not, would d/c as well  D/C Meds to be reconciled by nurse after every discharge.  Completed By: Izetta Boehringer, PA-C [facility] Pager [phone]   Reviewed by: MD:______ RN_______

## 2023-12-03 NOTE — Progress Notes (Addendum)
 Noted pt for possible d/c today. Contacted [facility] to be advised pt is for possible d/c today and pt to likely resume tomorrow.   Randine Mungo Renal Navigator [phone]  Addendum at 1:15 pm: [facility]eron[facility] Car advised pt will d/c today. Clinic asked that navigator please advise pt that clinic will open at 8:00 am tomorrow and pt can arrive for treatment 8:00 am -12:00 pm. Spoke to pt via phone to make aware of this info. Pt voices understanding.

## 2023-12-03 NOTE — Progress Notes (Signed)
   12/03/23 0145  Vitals  Temp 98.1 F (36.7 C)  Pulse Rate 73  Resp 18  BP (!) 185/105  SpO2 98 %  Post Treatment  Dialyzer Clearance Lightly streaked  Hemodialysis Intake (mL) 0 mL  Liters Processed 72  Fluid Removed (mL) 3000 mL  Tolerated HD Treatment Yes   Received patient in bed to unit.  Alert and oriented.  Informed consent signed and in chart.   TX duration:3hrs  Patient tolerated well.  Transported back to the room  Alert, without acute distress.  Hand-off given to patient's nurse.   Access used: [facility] Access issues: none  Total UF removed: 3L Medication(s) given: none    Na'Shaminy T Jeison [facility]

## 2023-12-03 NOTE — TOC Transition Note (Signed)
 Transition of Care [facility]) - Discharge Note   Patient Details  Name: [name] [name] MRN: [id number] Date of Birth: 1971-01-18  [facility]) CM/SW Contact:  Tom-Johnson, Harvest Muskrat, RN Phone Number: 12/03/2023, 2:02 PM   Clinical Narrative:     Patient is scheduled for discharge today.  Home health info, Outpatient f/u, hospital f/u and discharge instructions on AVS. Sister, Devere to transport at discharge.  No further TOC needs noted.           Final next level of care: Home w Home Health Services Barriers to Discharge: Barriers Resolved   Patient Goals and CMS Choice Patient states their goals for this hospitalization and ongoing recovery are:: To return home CMS Medicare.gov Compare Post Acute Care list provided to:: Patient Choice offered to / list presented to : Patient      Discharge Placement                Patient to be transferred to facility by: Sister Name of family member notified: Devere    Discharge Plan and Services Additional resources added to the After Visit Summary for                  DME Arranged: N/A DME Agency: NA       HH Arranged: PT, OT HH Agency: [facility] Date [facility] Agency Contacted: 12/03/23 Time HH Agency Contacted: 1320 Representative spoke with at [facility] Agency: [place]  Social Drivers of Health (SDOH) Interventions SDOH Screenings   Food Insecurity: Food Insecurity Present (12/01/2023)  Housing: Low Risk  (12/01/2023)  Recent Concern: Housing - Medium Risk (10/14/2023)  Transportation Needs: No Transportation Needs (12/01/2023)  Utilities: Not At Risk (12/01/2023)  Alcohol Screen: Low Risk  (06/08/2023)  Depression (PHQ2-9): High Risk (11/29/2023)  Financial Resource Strain: High Risk (10/30/2023)  Physical Activity: Unknown (10/30/2023)  Social Connections: Unknown (10/30/2023)  Stress: Stress Concern Present (10/30/2023)  Tobacco Use: High Risk (11/30/2023)  Health Literacy: Inadequate Health Literacy  (06/08/2023)     Readmission Risk Interventions     No data to display

## 2023-12-03 NOTE — Progress Notes (Signed)
 Mobility Specialist: Progress Note   12/03/23 0958  Mobility  Activity Refused mobility  Mobility Specialist Start Time (ACUTE ONLY) 0921  Mobility Specialist Stop Time (ACUTE ONLY) 0921  Mobility Specialist Time Calculation (min) (ACUTE ONLY) 0 min    Pt refused d/t pain during ambulation and feeling tired. Did not want MS to return later.   Ileana Lute Mobility Specialist Please contact via SecureChat or Rehab office at [phone]

## 2023-12-03 NOTE — Plan of Care (Signed)
 On vitals review, pt's bp now 209/98. Pt had HD after midnight with 3 L removed.  On chart review, pt has not been given any of his BP medications on 01/09 including coreg , hydralazine , or cardizem  with noted reason being that he was going to HD. Per RN, HD was scheduled for earlier but kept getting bumped. Went to bedside, pt watching TV in NAD with no complaints other than not receiving his BP medications. Lungs CTAB, heart RRR, neuro exam with CN 2-12 intact, finger nose finger nml.  Advised RN to give hydralazine  50 mg and to give 01/10 am coreg  early.

## 2023-12-04 DIAGNOSIS — N2581 Secondary hyperparathyroidism of renal origin: Secondary | ICD-10-CM | POA: Diagnosis not present

## 2023-12-04 DIAGNOSIS — D631 Anemia in chronic kidney disease: Secondary | ICD-10-CM | POA: Diagnosis not present

## 2023-12-04 DIAGNOSIS — D509 Iron deficiency anemia, unspecified: Secondary | ICD-10-CM | POA: Diagnosis not present

## 2023-12-04 DIAGNOSIS — T8249XA Other complication of vascular dialysis catheter, initial encounter: Secondary | ICD-10-CM | POA: Diagnosis not present

## 2023-12-04 DIAGNOSIS — Z992 Dependence on renal dialysis: Secondary | ICD-10-CM | POA: Diagnosis not present

## 2023-12-04 DIAGNOSIS — D689 Coagulation defect, unspecified: Secondary | ICD-10-CM | POA: Diagnosis not present

## 2023-12-04 DIAGNOSIS — N186 End stage renal disease: Secondary | ICD-10-CM | POA: Diagnosis not present

## 2023-12-04 DIAGNOSIS — E1122 Type 2 diabetes mellitus with diabetic chronic kidney disease: Secondary | ICD-10-CM | POA: Diagnosis not present

## 2023-12-04 DIAGNOSIS — Z23 Encounter for immunization: Secondary | ICD-10-CM | POA: Diagnosis not present

## 2023-12-06 ENCOUNTER — Telehealth: Payer: Self-pay

## 2023-12-06 DIAGNOSIS — S72001D Fracture of unspecified part of neck of right femur, subsequent encounter for closed fracture with routine healing: Secondary | ICD-10-CM | POA: Diagnosis not present

## 2023-12-06 DIAGNOSIS — I120 Hypertensive chronic kidney disease with stage 5 chronic kidney disease or end stage renal disease: Secondary | ICD-10-CM | POA: Diagnosis not present

## 2023-12-06 DIAGNOSIS — S72411D Displaced unspecified condyle fracture of lower end of right femur, subsequent encounter for closed fracture with routine healing: Secondary | ICD-10-CM | POA: Diagnosis not present

## 2023-12-06 DIAGNOSIS — E1122 Type 2 diabetes mellitus with diabetic chronic kidney disease: Secondary | ICD-10-CM | POA: Diagnosis not present

## 2023-12-06 DIAGNOSIS — S72141D Displaced intertrochanteric fracture of right femur, subsequent encounter for closed fracture with routine healing: Secondary | ICD-10-CM | POA: Diagnosis not present

## 2023-12-06 DIAGNOSIS — N186 End stage renal disease: Secondary | ICD-10-CM | POA: Diagnosis not present

## 2023-12-06 NOTE — Transitions of Care (Post Inpatient/ED Visit) (Signed)
 12/06/2023  Name: [name] [name] MRN: [id number] DOB: [birth year]  Today's TOC FU Call Status: Today's TOC FU Call Status:: Successful TOC FU Call Completed TOC FU Call Complete Date: 12/06/23 Patient's Name and Date of Birth confirmed.  Transition Care Management Follow-up Telephone Call Date of Discharge: 12/03/23 Discharge Facility: [facility]) Type of Discharge: Emergency Department Reason for ED Visit: Other: (altered mental) How have you been since you were released from the hospital?: Better Any questions or concerns?: No  Items Reviewed: Did you receive and understand the discharge instructions provided?: Yes Medications obtained,verified, and reconciled?: Yes (Medications Reviewed) Any new allergies since your discharge?: No Dietary orders reviewed?: Yes Do you have support at home?: Yes People in Home: spouse  Medications Reviewed Today: Medications Reviewed Today     Reviewed by Emmitt Pan, LPN (Licensed Practical Nurse) on 12/06/23 at 1139  Med List Status: <None>   Medication Order Taking? Sig Documenting Provider Last Dose Status Informant  acetaminophen  (TYLENOL ) 500 MG tablet [id number] No Take 2 tablets (1,000 mg total) by mouth every 6 (six) hours as needed. Lonnie Earnest, MD Taking Active Self, Pharmacy Records  AGAMATRIX ULTRA-THIN LANCETS MISC [id number] No Check blood sugars before meals twice daily Adella Norris, MD Taking Active Self, Pharmacy Records  atorvastatin  (LIPITOR) 40 MG tablet [id number] No Take 1 tablet (40 mg total) by mouth at bedtime.  Patient taking differently: Take 40 mg by mouth daily.   Christia Budds, MD 11/29/2023 Active Self, Pharmacy Records  Blood Glucose Monitoring Suppl (ACCU-CHEK GUIDE ME) w/Device KIT [id number] No See admin instructions. [provider] Taking Active Self, Pharmacy Records  calcitRIOL  (ROCALTROL ) 0.25 MCG capsule [id number] No Take 1 capsule (0.25 mcg total) by mouth Every  Tuesday,Thursday,and Saturday with dialysis. Lonnie Mahnoor, MD 11/27/2023 Active Self, Pharmacy Records  carvedilol  (COREG ) 12.5 MG tablet [id number] No Take 12.5 mg by mouth 2 (two) times daily with a meal. [provider] 11/30/2023 Morning Active Self, Pharmacy Records  Darbepoetin Alfa  (ARANESP ) 150 MCG/0.3ML SOSY injection [id number] No Inject 0.3 mLs (150 mcg total) into the skin every Saturday at 6 PM. Lonnie, Mahnoor, MD 11/27/2023 Active Self, Pharmacy Records  diclofenac  Sodium (VOLTAREN ) 1 % GEL [id number] No Apply 4 g topically 4 (four) times daily.  Patient not taking: Reported on 11/30/2023   Christia Budds, MD Not Taking Active Self, Pharmacy Records  diltiazem  (CARDIZEM  CD) 300 MG 24 hr capsule [id number] No Take 1 capsule (300 mg total) by mouth daily. Lonnie Mahnoor, MD 11/30/2023 Active Self, Pharmacy Records  FLUoxetine  (PROZAC ) 20 MG capsule [id number] No Take 1 capsule (20 mg total) by mouth daily. Lonnie Mahnoor, MD 11/30/2023 Active Self, Pharmacy Records  gabapentin  (NEURONTIN ) 300 MG capsule [id number] No Take 1 capsule (300 mg total) by mouth daily. Lonnie Mahnoor, MD 11/30/2023 Active Self, Pharmacy Records  glucose blood (AGAMATRIX PRESTO TEST) test strip [id number] No Check blood sugars twice daily before meals Adella Norris, MD Taking Active Self, Pharmacy Records  glucose blood test strip [id number] No 1 each by Other route as needed for other. Use as instructed [provider] Taking Active Self, Pharmacy Records  hydrALAZINE  (APRESOLINE ) 50 MG tablet [id number] No Take 1 tablet (50 mg total) by mouth 2 (two) times daily. Lonnie Mahnoor, MD 11/30/2023 Active Self, Pharmacy Records  Insulin  Disposable Pump (OMNIPOD DASH PODS, GEN 4,) MISC [id number] No  [provider] Taking Active Self, Pharmacy Records  insulin  lispro (HUMALOG ) 100 UNIT/ML injection [id number] No 0-6 Units, Subcutaneous, 3 times daily  with meals, First dose on Sun 10/17/23 at  0800 Correction coverage: Very Sensitive (ESRD/Dialysis) CBG < 70: Implement Hypoglycemia Standing Orders and refer to Hypoglycemia Standing Orders sidebar report CBG 70 - 120: 0 units CBG 121 - 150: 0 units CBG 151 - 200: 1 unit CBG 201-250: 2 units CBG 251-300: 3 units CBG 301-350: 4 units CBG 351-400: 5 units CBG > 400: Give 6 units and call MD Baloch, Mahnoor, MD 11/28/2023 Active Self, Pharmacy Records  lidocaine  (HM LIDOCAINE  PATCH) 4 % [id number] No Place 1 patch onto the skin daily.  Patient not taking: Reported on 11/30/2023   Christia Budds, MD Not Taking Active Self, Pharmacy Records  polyethylene glycol (MIRALAX  / GLYCOLAX ) 17 g packet [id number] No Mix as directed and take 34 g by mouth 3 (three) times daily. Lonnie Mahnoor, MD Past Week Active Self, Pharmacy Records  senna (SENOKOT) 8.6 MG TABS tablet [id number] No Take 1 tablet (8.6 mg total) by mouth 2 (two) times daily. Lonnie Mahnoor, MD Past Week Active Self, Pharmacy Records  sevelamer  carbonate (RENVELA ) 800 MG tablet [id number] No Take 2 tablets (1,600 mg total) by mouth 3 (three) times daily with meals. Lonnie Mahnoor, MD 11/30/2023 Active Self, Pharmacy Records  tamsulosin  (FLOMAX ) 0.4 MG CAPS capsule [id number] No Take 1 capsule (0.4 mg total) by mouth daily after supper. Baloch, Mahnoor, MD 11/29/2023 Active Self, Pharmacy Records  Vitamin D , Ergocalciferol , (DRISDOL ) 1.25 MG (50000 UNIT) CAPS capsule [id number] No Take 1 capsule (50,000 Units total) by mouth every 7 (seven) days.  Patient not taking: Reported on 11/30/2023   Lonnie Earnest, MD Not Taking Active Self, Pharmacy Records           Med Note [facility] [place], [place] A   Tue Nov 30, 2023  5:44 PM) Pt states that he is currently not taking as directed.            Home Care and Equipment/Supplies: Were Home Health Services Ordered?: Yes Name of Home Health Agency:: [facility] Has Agency set up a time to come to your home?: Yes First Home Health Visit Date:  12/06/23 Any new equipment or medical supplies ordered?: NA  Functional Questionnaire: Do you need assistance with bathing/showering or dressing?: No Do you need assistance with meal preparation?: No Do you need assistance with eating?: No Do you have difficulty maintaining continence: No Do you need assistance with getting out of bed/getting out of a chair/moving?: No Do you have difficulty managing or taking your medications?: No  Follow up appointments reviewed: PCP Follow-up appointment confirmed?: Yes Date of PCP follow-up appointment?: 12/13/23 Follow-up Provider: [facility] Follow-up appointment confirmed?: NA Do you need transportation to your follow-up appointment?: No Do you understand care options if your condition(s) worsen?: Yes-patient verbalized understanding    SIGNATURE Julian Lemmings, LPN [facility] Nurse Health Advisor Direct Dial  [phone]

## 2023-12-07 ENCOUNTER — Telehealth: Payer: Self-pay

## 2023-12-07 DIAGNOSIS — N2581 Secondary hyperparathyroidism of renal origin: Secondary | ICD-10-CM | POA: Diagnosis not present

## 2023-12-07 DIAGNOSIS — N186 End stage renal disease: Secondary | ICD-10-CM | POA: Diagnosis not present

## 2023-12-07 DIAGNOSIS — D631 Anemia in chronic kidney disease: Secondary | ICD-10-CM | POA: Diagnosis not present

## 2023-12-07 DIAGNOSIS — D509 Iron deficiency anemia, unspecified: Secondary | ICD-10-CM | POA: Diagnosis not present

## 2023-12-07 DIAGNOSIS — D689 Coagulation defect, unspecified: Secondary | ICD-10-CM | POA: Diagnosis not present

## 2023-12-07 DIAGNOSIS — E1122 Type 2 diabetes mellitus with diabetic chronic kidney disease: Secondary | ICD-10-CM | POA: Diagnosis not present

## 2023-12-07 DIAGNOSIS — Z992 Dependence on renal dialysis: Secondary | ICD-10-CM | POA: Diagnosis not present

## 2023-12-07 DIAGNOSIS — Z23 Encounter for immunization: Secondary | ICD-10-CM | POA: Diagnosis not present

## 2023-12-07 DIAGNOSIS — T8249XA Other complication of vascular dialysis catheter, initial encounter: Secondary | ICD-10-CM | POA: Diagnosis not present

## 2023-12-07 NOTE — Telephone Encounter (Signed)
 Monique from [employer] calling for PT verbal orders as follows:  2 time(s) weekly for 3 week(s), then 1 time(s) weekly for 3 week(s)  Verbal orders given per [facility] protocol  Veronda Prude, RN

## 2023-12-09 ENCOUNTER — Other Ambulatory Visit: Payer: Self-pay | Admitting: Student

## 2023-12-09 DIAGNOSIS — D689 Coagulation defect, unspecified: Secondary | ICD-10-CM | POA: Diagnosis not present

## 2023-12-09 DIAGNOSIS — D509 Iron deficiency anemia, unspecified: Secondary | ICD-10-CM | POA: Diagnosis not present

## 2023-12-09 DIAGNOSIS — E1122 Type 2 diabetes mellitus with diabetic chronic kidney disease: Secondary | ICD-10-CM | POA: Diagnosis not present

## 2023-12-09 DIAGNOSIS — N2581 Secondary hyperparathyroidism of renal origin: Secondary | ICD-10-CM | POA: Diagnosis not present

## 2023-12-09 DIAGNOSIS — R112 Nausea with vomiting, unspecified: Secondary | ICD-10-CM

## 2023-12-09 DIAGNOSIS — N186 End stage renal disease: Secondary | ICD-10-CM | POA: Diagnosis not present

## 2023-12-09 DIAGNOSIS — D631 Anemia in chronic kidney disease: Secondary | ICD-10-CM | POA: Diagnosis not present

## 2023-12-09 DIAGNOSIS — Z23 Encounter for immunization: Secondary | ICD-10-CM | POA: Diagnosis not present

## 2023-12-09 DIAGNOSIS — T8249XA Other complication of vascular dialysis catheter, initial encounter: Secondary | ICD-10-CM | POA: Diagnosis not present

## 2023-12-09 DIAGNOSIS — Z992 Dependence on renal dialysis: Secondary | ICD-10-CM | POA: Diagnosis not present

## 2023-12-10 ENCOUNTER — Telehealth: Payer: Self-pay

## 2023-12-10 DIAGNOSIS — S72411D Displaced unspecified condyle fracture of lower end of right femur, subsequent encounter for closed fracture with routine healing: Secondary | ICD-10-CM | POA: Diagnosis not present

## 2023-12-10 DIAGNOSIS — E1122 Type 2 diabetes mellitus with diabetic chronic kidney disease: Secondary | ICD-10-CM | POA: Diagnosis not present

## 2023-12-10 DIAGNOSIS — N186 End stage renal disease: Secondary | ICD-10-CM | POA: Diagnosis not present

## 2023-12-10 DIAGNOSIS — S72001D Fracture of unspecified part of neck of right femur, subsequent encounter for closed fracture with routine healing: Secondary | ICD-10-CM | POA: Diagnosis not present

## 2023-12-10 DIAGNOSIS — S72141D Displaced intertrochanteric fracture of right femur, subsequent encounter for closed fracture with routine healing: Secondary | ICD-10-CM | POA: Diagnosis not present

## 2023-12-10 DIAGNOSIS — I120 Hypertensive chronic kidney disease with stage 5 chronic kidney disease or end stage renal disease: Secondary | ICD-10-CM | POA: Diagnosis not present

## 2023-12-10 NOTE — Telephone Encounter (Signed)
Patient LVM on nurse line regarding Zofran refill. He reports that he needs refill as he has episodes of nausea everyday.   He states that this has been going on for a while now. Last office visit this was discussed at was on 03/01/23.  Attempted to call patient to gather more information, unable to reach or LVM.   Patient has visit with PCP on 12/13/23.  Veronda Prude, RN

## 2023-12-11 DIAGNOSIS — E1122 Type 2 diabetes mellitus with diabetic chronic kidney disease: Secondary | ICD-10-CM | POA: Diagnosis not present

## 2023-12-11 DIAGNOSIS — D631 Anemia in chronic kidney disease: Secondary | ICD-10-CM | POA: Diagnosis not present

## 2023-12-11 DIAGNOSIS — D509 Iron deficiency anemia, unspecified: Secondary | ICD-10-CM | POA: Diagnosis not present

## 2023-12-11 DIAGNOSIS — T8249XA Other complication of vascular dialysis catheter, initial encounter: Secondary | ICD-10-CM | POA: Diagnosis not present

## 2023-12-11 DIAGNOSIS — D689 Coagulation defect, unspecified: Secondary | ICD-10-CM | POA: Diagnosis not present

## 2023-12-11 DIAGNOSIS — N2581 Secondary hyperparathyroidism of renal origin: Secondary | ICD-10-CM | POA: Diagnosis not present

## 2023-12-11 DIAGNOSIS — N186 End stage renal disease: Secondary | ICD-10-CM | POA: Diagnosis not present

## 2023-12-11 DIAGNOSIS — Z23 Encounter for immunization: Secondary | ICD-10-CM | POA: Diagnosis not present

## 2023-12-11 DIAGNOSIS — Z992 Dependence on renal dialysis: Secondary | ICD-10-CM | POA: Diagnosis not present

## 2023-12-12 MED ORDER — ONDANSETRON 4 MG PO TBDP: 4.0000 mg | ORAL_TABLET | Freq: Three times a day (TID) | ORAL | 0 refills | Status: DC | PRN

## 2023-12-13 ENCOUNTER — Ambulatory Visit: Payer: 59 | Admitting: Student

## 2023-12-13 VITALS — BP 148/76 | HR 80

## 2023-12-13 DIAGNOSIS — Z9181 History of falling: Secondary | ICD-10-CM

## 2023-12-13 MED ORDER — TIZANIDINE HCL 4 MG PO TABS: 2.0000 mg | ORAL_TABLET | Freq: Every day | ORAL | 0 refills | Status: DC | PRN

## 2023-12-13 NOTE — Patient Instructions (Addendum)
It was great to see you! Thank you for allowing me to participate in your care!   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  [name], MD

## 2023-12-13 NOTE — Progress Notes (Signed)
    SUBJECTIVE:   CHIEF COMPLAINT / HPI: Hospital Fu  Admitted 1/7-1/10 Patient presented initially with confusion and hypotension noted at his dialysis appointment. He had an abnormal sleep schedule causing him to take his medications too closely together, including his hydralazine, oxycodone, and baclofen.  Patient with some abdominal pain on admission. Of note has a history of recurrent vomiting and diarrhea. 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   Asks whether GLP-1 would be okay for him  Today has right knee pain that is followed by orthopedics. 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 (Yes, states takes it once a day states he does produce an average amount of urine a day, not very small amount. If he does not take it-within a day or so has dificullty with urinating (usually spends over an hour trying to pee, without it spends 2-3 hours trying to pee) Med rec: consider continuing to hold oxycodone (10 mg 4 times a day-not prescribed by me-orthopedics refilled this, I  discussed by worry with this) and baclofen (for prosthesis pain-he takes daily, discussed changing to tizanidine)  PERTINENT  PMH / PSH: ESRD  OBJECTIVE:   BP (!) 148/76 Comment: Manual BP  Pulse 80   SpO2 97%   General:NAD, awake, alert, responsive to questions Head: Normocephalic atraumatic CV: Regular rate and rhythm no murmurs rubs or gallops Respiratory: Clear to ausculation bilaterally, no wheezes rales or crackles, chest rises symmetrically,  no increased work of breathing Extremities: In wheelchair, pain over right stump with movement  ASSESSMENT/PLAN:   Assessment & Plan History of fall Multiple falls within the last year and recent admission to the hospital.  Medication likely contributing to fall. -Stop baclofen, switch to tizanidine 2 mg as needed daily -Encouraged decreasing use of oxycodone, this is not prescribed by myself -DME wheelchair ordered, message sent to RN team -Flomax continued given it helps patient at home discussed that this can cause orthostatic hypotension -Patient to follow-up with endocrinology as adrenal adenoma on CT -Patient to follow-up in June for repeat MRI of abdomen for pancreatic IPMN findings (Discussed would avoid GLP-1 given pancreatic findings) -1 mo f/u   [name], MD [facility]

## 2023-12-13 NOTE — Telephone Encounter (Signed)
Unable to reach patient but he have an appt today at 250 with [facility]

## 2023-12-14 DIAGNOSIS — D509 Iron deficiency anemia, unspecified: Secondary | ICD-10-CM | POA: Diagnosis not present

## 2023-12-14 DIAGNOSIS — D689 Coagulation defect, unspecified: Secondary | ICD-10-CM | POA: Diagnosis not present

## 2023-12-14 DIAGNOSIS — D631 Anemia in chronic kidney disease: Secondary | ICD-10-CM | POA: Diagnosis not present

## 2023-12-14 DIAGNOSIS — E1122 Type 2 diabetes mellitus with diabetic chronic kidney disease: Secondary | ICD-10-CM | POA: Diagnosis not present

## 2023-12-14 DIAGNOSIS — N186 End stage renal disease: Secondary | ICD-10-CM | POA: Diagnosis not present

## 2023-12-14 DIAGNOSIS — Z23 Encounter for immunization: Secondary | ICD-10-CM | POA: Diagnosis not present

## 2023-12-14 DIAGNOSIS — Z992 Dependence on renal dialysis: Secondary | ICD-10-CM | POA: Diagnosis not present

## 2023-12-14 DIAGNOSIS — N2581 Secondary hyperparathyroidism of renal origin: Secondary | ICD-10-CM | POA: Diagnosis not present

## 2023-12-14 DIAGNOSIS — T8249XA Other complication of vascular dialysis catheter, initial encounter: Secondary | ICD-10-CM | POA: Diagnosis not present

## 2023-12-15 ENCOUNTER — Other Ambulatory Visit: Payer: Self-pay | Admitting: Student

## 2023-12-15 DIAGNOSIS — S72411D Displaced unspecified condyle fracture of lower end of right femur, subsequent encounter for closed fracture with routine healing: Secondary | ICD-10-CM | POA: Diagnosis not present

## 2023-12-15 DIAGNOSIS — E1122 Type 2 diabetes mellitus with diabetic chronic kidney disease: Secondary | ICD-10-CM | POA: Diagnosis not present

## 2023-12-15 DIAGNOSIS — S72001D Fracture of unspecified part of neck of right femur, subsequent encounter for closed fracture with routine healing: Secondary | ICD-10-CM | POA: Diagnosis not present

## 2023-12-15 DIAGNOSIS — S72141D Displaced intertrochanteric fracture of right femur, subsequent encounter for closed fracture with routine healing: Secondary | ICD-10-CM | POA: Diagnosis not present

## 2023-12-15 DIAGNOSIS — I120 Hypertensive chronic kidney disease with stage 5 chronic kidney disease or end stage renal disease: Secondary | ICD-10-CM | POA: Diagnosis not present

## 2023-12-15 DIAGNOSIS — N186 End stage renal disease: Secondary | ICD-10-CM | POA: Diagnosis not present

## 2023-12-16 DIAGNOSIS — Z992 Dependence on renal dialysis: Secondary | ICD-10-CM | POA: Diagnosis not present

## 2023-12-16 DIAGNOSIS — D509 Iron deficiency anemia, unspecified: Secondary | ICD-10-CM | POA: Diagnosis not present

## 2023-12-16 DIAGNOSIS — D631 Anemia in chronic kidney disease: Secondary | ICD-10-CM | POA: Diagnosis not present

## 2023-12-16 DIAGNOSIS — N2581 Secondary hyperparathyroidism of renal origin: Secondary | ICD-10-CM | POA: Diagnosis not present

## 2023-12-16 DIAGNOSIS — D689 Coagulation defect, unspecified: Secondary | ICD-10-CM | POA: Diagnosis not present

## 2023-12-16 DIAGNOSIS — N186 End stage renal disease: Secondary | ICD-10-CM | POA: Diagnosis not present

## 2023-12-16 DIAGNOSIS — T8249XA Other complication of vascular dialysis catheter, initial encounter: Secondary | ICD-10-CM | POA: Diagnosis not present

## 2023-12-16 DIAGNOSIS — Z23 Encounter for immunization: Secondary | ICD-10-CM | POA: Diagnosis not present

## 2023-12-16 DIAGNOSIS — E1122 Type 2 diabetes mellitus with diabetic chronic kidney disease: Secondary | ICD-10-CM | POA: Diagnosis not present

## 2023-12-17 ENCOUNTER — Ambulatory Visit (INDEPENDENT_AMBULATORY_CARE_PROVIDER_SITE_OTHER): Payer: 59 | Admitting: Physician Assistant

## 2023-12-17 ENCOUNTER — Ambulatory Visit (HOSPITAL_COMMUNITY)
Admission: RE | Admit: 2023-12-17 | Discharge: 2023-12-17 | Disposition: A | Discharge status: Home / Self Care | Payer: 59 | Source: Ambulatory Visit | Attending: Vascular Surgery | Admitting: Vascular Surgery

## 2023-12-17 ENCOUNTER — Other Ambulatory Visit: Payer: Self-pay | Admitting: Student

## 2023-12-17 VITALS — BP 162/90 | HR 91 | Temp 98.7°F | Resp 20 | Ht 77.0 in | Wt 222.0 lb

## 2023-12-17 DIAGNOSIS — E1122 Type 2 diabetes mellitus with diabetic chronic kidney disease: Secondary | ICD-10-CM | POA: Diagnosis not present

## 2023-12-17 DIAGNOSIS — Z992 Dependence on renal dialysis: Secondary | ICD-10-CM

## 2023-12-17 DIAGNOSIS — N186 End stage renal disease: Secondary | ICD-10-CM

## 2023-12-17 DIAGNOSIS — S72141D Displaced intertrochanteric fracture of right femur, subsequent encounter for closed fracture with routine healing: Secondary | ICD-10-CM | POA: Diagnosis not present

## 2023-12-17 DIAGNOSIS — I120 Hypertensive chronic kidney disease with stage 5 chronic kidney disease or end stage renal disease: Secondary | ICD-10-CM | POA: Diagnosis not present

## 2023-12-17 DIAGNOSIS — S72411D Displaced unspecified condyle fracture of lower end of right femur, subsequent encounter for closed fracture with routine healing: Secondary | ICD-10-CM | POA: Diagnosis not present

## 2023-12-17 DIAGNOSIS — S72001D Fracture of unspecified part of neck of right femur, subsequent encounter for closed fracture with routine healing: Secondary | ICD-10-CM | POA: Diagnosis not present

## 2023-12-17 NOTE — Progress Notes (Signed)
    Postoperative Access Visit   History of Present Illness   [name] [name] is a 53 y.o. year old male who presents for postoperative follow-up for:  Left brachiocephalic AV fistula on 10/19/23 by Dr. Hetty Blend. The patient's wounds are well healed.  The patient notes no steal symptoms.  He unfortunately was hospitalized at the beginning of January due to hypotension and AMS after taking too much of his Hydralazine and due to his hypotension he was told his fistula occluded.  He has been dialyzing on TTS via a right internal jugular TDC  Physical Examination   Vitals:   12/17/23 1125  BP: (!) 162/90  Pulse: 91  Resp: 20  Temp: 98.7 F (37.1 C)  TempSrc: Temporal  SpO2: 96%  Weight: 222 lb (100.7 kg)  Height: 6\' 5"  (1.956 m)   Body mass index is 26.33 kg/m.  left arm Incision is well healed, 2+ radial pulse, hand grip is 5/5, sensation in digits is intact, no palpable thrill, bruit cannot be auscultated    Non invasive studies: VAS US Duplex dialysis access:  Summary:  Arteriovenous fistula-Thrombus noted in the cephalic outflow vein at the level of the antecubital fossa. Volume flow 50 cc/min.   Medical Decision Making   [name] [name] is a 53 y.o. year old male who presents s/p Left brachiocephalic AV fistula on 10/19/23 by Dr. Hetty Blend. The incision is well healed. Unfortunately secondary to hypotension events his left AV fistula is occluded. Duplex today confirms thrombus in the fistula at the outflow vein with trickle flow in the vein. I will get him schedule for repeat upper extremity vein mapping. Likely will be able to use his left basilic vein. Will discuss future access planning when he returns over the next couple weeks with his duplex   Graceann Congress, PA-C [facility] Office: [phone]  Clinic MD: Hetty Blend

## 2023-12-17 NOTE — Telephone Encounter (Signed)
Patient calls nurse line requesting a refill on Farxiga.  He reports he has been trying to get this refilled for a while now.   It appears the system has been automatically refusing the refill.   Reordered medication in epic for PCP to send in.

## 2023-12-18 DIAGNOSIS — Z23 Encounter for immunization: Secondary | ICD-10-CM | POA: Diagnosis not present

## 2023-12-18 DIAGNOSIS — D509 Iron deficiency anemia, unspecified: Secondary | ICD-10-CM | POA: Diagnosis not present

## 2023-12-18 DIAGNOSIS — D689 Coagulation defect, unspecified: Secondary | ICD-10-CM | POA: Diagnosis not present

## 2023-12-18 DIAGNOSIS — N186 End stage renal disease: Secondary | ICD-10-CM | POA: Diagnosis not present

## 2023-12-18 DIAGNOSIS — D631 Anemia in chronic kidney disease: Secondary | ICD-10-CM | POA: Diagnosis not present

## 2023-12-18 DIAGNOSIS — T8249XA Other complication of vascular dialysis catheter, initial encounter: Secondary | ICD-10-CM | POA: Diagnosis not present

## 2023-12-18 DIAGNOSIS — Z992 Dependence on renal dialysis: Secondary | ICD-10-CM | POA: Diagnosis not present

## 2023-12-18 DIAGNOSIS — N2581 Secondary hyperparathyroidism of renal origin: Secondary | ICD-10-CM | POA: Diagnosis not present

## 2023-12-18 DIAGNOSIS — Z794 Long term (current) use of insulin: Secondary | ICD-10-CM | POA: Diagnosis not present

## 2023-12-18 DIAGNOSIS — E118 Type 2 diabetes mellitus with unspecified complications: Secondary | ICD-10-CM | POA: Diagnosis not present

## 2023-12-18 DIAGNOSIS — E1122 Type 2 diabetes mellitus with diabetic chronic kidney disease: Secondary | ICD-10-CM | POA: Diagnosis not present

## 2023-12-19 ENCOUNTER — Telehealth: Payer: Self-pay | Admitting: Student

## 2023-12-19 DIAGNOSIS — S72001D Fracture of unspecified part of neck of right femur, subsequent encounter for closed fracture with routine healing: Secondary | ICD-10-CM | POA: Diagnosis not present

## 2023-12-19 DIAGNOSIS — I120 Hypertensive chronic kidney disease with stage 5 chronic kidney disease or end stage renal disease: Secondary | ICD-10-CM | POA: Diagnosis not present

## 2023-12-19 DIAGNOSIS — E1122 Type 2 diabetes mellitus with diabetic chronic kidney disease: Secondary | ICD-10-CM | POA: Diagnosis not present

## 2023-12-19 DIAGNOSIS — S72411D Displaced unspecified condyle fracture of lower end of right femur, subsequent encounter for closed fracture with routine healing: Secondary | ICD-10-CM | POA: Diagnosis not present

## 2023-12-19 DIAGNOSIS — N186 End stage renal disease: Secondary | ICD-10-CM | POA: Diagnosis not present

## 2023-12-19 DIAGNOSIS — S72141D Displaced intertrochanteric fracture of right femur, subsequent encounter for closed fracture with routine healing: Secondary | ICD-10-CM | POA: Diagnosis not present

## 2023-12-19 NOTE — Telephone Encounter (Signed)
**  After Hours/ Emergency Line Call**  Received a page to call [phone]) - [phone]. Received page @ Patient: [name] [name]  Caller: Self  Confirmed name & DOB of patient with caller  Attemped to call x2, unable to reach. Left HIPAA compliant voicemail that Dr. Claudean Severance with [facility] was calling back a page. Informed patient I may not be able to call back again, and he will need to recall after hours line.   -- Will forward to PCP.  Tiffany Kocher, DO [facility], PGY-1

## 2023-12-20 ENCOUNTER — Other Ambulatory Visit: Payer: Self-pay | Admitting: Student

## 2023-12-20 NOTE — Telephone Encounter (Signed)
Informed patient that the medication was discontinued due to him being on dialysis. Patient stated that he didn't understand why Dr.Baloch would take him off.

## 2023-12-21 ENCOUNTER — Telehealth: Payer: Self-pay

## 2023-12-21 DIAGNOSIS — N2581 Secondary hyperparathyroidism of renal origin: Secondary | ICD-10-CM | POA: Diagnosis not present

## 2023-12-21 DIAGNOSIS — Z23 Encounter for immunization: Secondary | ICD-10-CM | POA: Diagnosis not present

## 2023-12-21 DIAGNOSIS — T8249XA Other complication of vascular dialysis catheter, initial encounter: Secondary | ICD-10-CM | POA: Diagnosis not present

## 2023-12-21 DIAGNOSIS — E1122 Type 2 diabetes mellitus with diabetic chronic kidney disease: Secondary | ICD-10-CM | POA: Diagnosis not present

## 2023-12-21 DIAGNOSIS — N186 End stage renal disease: Secondary | ICD-10-CM | POA: Diagnosis not present

## 2023-12-21 DIAGNOSIS — D689 Coagulation defect, unspecified: Secondary | ICD-10-CM | POA: Diagnosis not present

## 2023-12-21 DIAGNOSIS — D631 Anemia in chronic kidney disease: Secondary | ICD-10-CM | POA: Diagnosis not present

## 2023-12-21 DIAGNOSIS — D509 Iron deficiency anemia, unspecified: Secondary | ICD-10-CM | POA: Diagnosis not present

## 2023-12-21 DIAGNOSIS — Z992 Dependence on renal dialysis: Secondary | ICD-10-CM | POA: Diagnosis not present

## 2023-12-21 NOTE — Telephone Encounter (Signed)
Community message sent to [employer] for manual wheelchair. Will await response.   Veronda Prude, RN

## 2023-12-22 ENCOUNTER — Other Ambulatory Visit: Payer: Self-pay | Admitting: Student

## 2023-12-22 DIAGNOSIS — I120 Hypertensive chronic kidney disease with stage 5 chronic kidney disease or end stage renal disease: Secondary | ICD-10-CM | POA: Diagnosis not present

## 2023-12-22 DIAGNOSIS — E1122 Type 2 diabetes mellitus with diabetic chronic kidney disease: Secondary | ICD-10-CM | POA: Diagnosis not present

## 2023-12-22 DIAGNOSIS — R262 Difficulty in walking, not elsewhere classified: Secondary | ICD-10-CM | POA: Diagnosis not present

## 2023-12-22 DIAGNOSIS — S72001D Fracture of unspecified part of neck of right femur, subsequent encounter for closed fracture with routine healing: Secondary | ICD-10-CM | POA: Diagnosis not present

## 2023-12-22 DIAGNOSIS — N186 End stage renal disease: Secondary | ICD-10-CM | POA: Diagnosis not present

## 2023-12-22 DIAGNOSIS — M25561 Pain in right knee: Secondary | ICD-10-CM | POA: Diagnosis not present

## 2023-12-22 DIAGNOSIS — S72411D Displaced unspecified condyle fracture of lower end of right femur, subsequent encounter for closed fracture with routine healing: Secondary | ICD-10-CM | POA: Diagnosis not present

## 2023-12-22 DIAGNOSIS — S72141D Displaced intertrochanteric fracture of right femur, subsequent encounter for closed fracture with routine healing: Secondary | ICD-10-CM | POA: Diagnosis not present

## 2023-12-23 DIAGNOSIS — Z992 Dependence on renal dialysis: Secondary | ICD-10-CM | POA: Diagnosis not present

## 2023-12-23 DIAGNOSIS — D509 Iron deficiency anemia, unspecified: Secondary | ICD-10-CM | POA: Diagnosis not present

## 2023-12-23 DIAGNOSIS — N2581 Secondary hyperparathyroidism of renal origin: Secondary | ICD-10-CM | POA: Diagnosis not present

## 2023-12-23 DIAGNOSIS — N186 End stage renal disease: Secondary | ICD-10-CM | POA: Diagnosis not present

## 2023-12-23 DIAGNOSIS — D631 Anemia in chronic kidney disease: Secondary | ICD-10-CM | POA: Diagnosis not present

## 2023-12-23 DIAGNOSIS — T8249XA Other complication of vascular dialysis catheter, initial encounter: Secondary | ICD-10-CM | POA: Diagnosis not present

## 2023-12-23 DIAGNOSIS — Z23 Encounter for immunization: Secondary | ICD-10-CM | POA: Diagnosis not present

## 2023-12-23 DIAGNOSIS — D689 Coagulation defect, unspecified: Secondary | ICD-10-CM | POA: Diagnosis not present

## 2023-12-23 DIAGNOSIS — E1122 Type 2 diabetes mellitus with diabetic chronic kidney disease: Secondary | ICD-10-CM | POA: Diagnosis not present

## 2023-12-24 DIAGNOSIS — N186 End stage renal disease: Secondary | ICD-10-CM | POA: Diagnosis not present

## 2023-12-24 DIAGNOSIS — I120 Hypertensive chronic kidney disease with stage 5 chronic kidney disease or end stage renal disease: Secondary | ICD-10-CM | POA: Diagnosis not present

## 2023-12-24 DIAGNOSIS — S72411D Displaced unspecified condyle fracture of lower end of right femur, subsequent encounter for closed fracture with routine healing: Secondary | ICD-10-CM | POA: Diagnosis not present

## 2023-12-24 DIAGNOSIS — I129 Hypertensive chronic kidney disease with stage 1 through stage 4 chronic kidney disease, or unspecified chronic kidney disease: Secondary | ICD-10-CM | POA: Diagnosis not present

## 2023-12-24 DIAGNOSIS — S72001D Fracture of unspecified part of neck of right femur, subsequent encounter for closed fracture with routine healing: Secondary | ICD-10-CM | POA: Diagnosis not present

## 2023-12-24 DIAGNOSIS — E1122 Type 2 diabetes mellitus with diabetic chronic kidney disease: Secondary | ICD-10-CM | POA: Diagnosis not present

## 2023-12-24 DIAGNOSIS — S72141D Displaced intertrochanteric fracture of right femur, subsequent encounter for closed fracture with routine healing: Secondary | ICD-10-CM | POA: Diagnosis not present

## 2023-12-24 DIAGNOSIS — M25561 Pain in right knee: Secondary | ICD-10-CM | POA: Diagnosis not present

## 2023-12-24 DIAGNOSIS — Z992 Dependence on renal dialysis: Secondary | ICD-10-CM | POA: Diagnosis not present

## 2023-12-25 DIAGNOSIS — N186 End stage renal disease: Secondary | ICD-10-CM | POA: Diagnosis not present

## 2023-12-25 DIAGNOSIS — T8249XA Other complication of vascular dialysis catheter, initial encounter: Secondary | ICD-10-CM | POA: Diagnosis not present

## 2023-12-25 DIAGNOSIS — Z992 Dependence on renal dialysis: Secondary | ICD-10-CM | POA: Diagnosis not present

## 2023-12-25 DIAGNOSIS — D689 Coagulation defect, unspecified: Secondary | ICD-10-CM | POA: Diagnosis not present

## 2023-12-25 DIAGNOSIS — N2581 Secondary hyperparathyroidism of renal origin: Secondary | ICD-10-CM | POA: Diagnosis not present

## 2023-12-27 ENCOUNTER — Other Ambulatory Visit: Payer: Self-pay

## 2023-12-27 DIAGNOSIS — Z992 Dependence on renal dialysis: Secondary | ICD-10-CM

## 2023-12-28 DIAGNOSIS — Z992 Dependence on renal dialysis: Secondary | ICD-10-CM | POA: Diagnosis not present

## 2023-12-28 DIAGNOSIS — N2581 Secondary hyperparathyroidism of renal origin: Secondary | ICD-10-CM | POA: Diagnosis not present

## 2023-12-28 DIAGNOSIS — D509 Iron deficiency anemia, unspecified: Secondary | ICD-10-CM | POA: Diagnosis not present

## 2023-12-28 DIAGNOSIS — D689 Coagulation defect, unspecified: Secondary | ICD-10-CM | POA: Diagnosis not present

## 2023-12-28 DIAGNOSIS — E1122 Type 2 diabetes mellitus with diabetic chronic kidney disease: Secondary | ICD-10-CM | POA: Diagnosis not present

## 2023-12-28 DIAGNOSIS — T8249XA Other complication of vascular dialysis catheter, initial encounter: Secondary | ICD-10-CM | POA: Diagnosis not present

## 2023-12-28 DIAGNOSIS — N186 End stage renal disease: Secondary | ICD-10-CM | POA: Diagnosis not present

## 2023-12-28 DIAGNOSIS — Z23 Encounter for immunization: Secondary | ICD-10-CM | POA: Diagnosis not present

## 2023-12-28 NOTE — Telephone Encounter (Signed)
 Patient calls nurse line in regards to wheelchair.   He reports he received his new one, however it is too big.   He reports his previous wheelchair was too short. He reports the dimensions were 18x16x18.  The one he has now that is too wide is 20x16.  Will send [employer] a message requesting dimension specifications.

## 2023-12-29 ENCOUNTER — Telehealth: Payer: Self-pay

## 2023-12-29 DIAGNOSIS — S72001D Fracture of unspecified part of neck of right femur, subsequent encounter for closed fracture with routine healing: Secondary | ICD-10-CM | POA: Diagnosis not present

## 2023-12-29 DIAGNOSIS — N186 End stage renal disease: Secondary | ICD-10-CM | POA: Diagnosis not present

## 2023-12-29 DIAGNOSIS — S72141D Displaced intertrochanteric fracture of right femur, subsequent encounter for closed fracture with routine healing: Secondary | ICD-10-CM | POA: Diagnosis not present

## 2023-12-29 DIAGNOSIS — E1122 Type 2 diabetes mellitus with diabetic chronic kidney disease: Secondary | ICD-10-CM | POA: Diagnosis not present

## 2023-12-29 DIAGNOSIS — S72411D Displaced unspecified condyle fracture of lower end of right femur, subsequent encounter for closed fracture with routine healing: Secondary | ICD-10-CM | POA: Diagnosis not present

## 2023-12-29 DIAGNOSIS — I120 Hypertensive chronic kidney disease with stage 5 chronic kidney disease or end stage renal disease: Secondary | ICD-10-CM | POA: Diagnosis not present

## 2023-12-29 NOTE — Telephone Encounter (Signed)
 Received confirmation from [employer] they received my message.   Will await any updates and assistance.

## 2023-12-29 NOTE — Telephone Encounter (Signed)
 Patient LVM on nurse line regarding issues with prescriptions. VM did not include the names of medications.   Called back to patient, he did not answer. LVM asking patient to return call to office in order to further assist.   Elsie Halo, RN

## 2023-12-30 ENCOUNTER — Other Ambulatory Visit: Payer: Self-pay | Admitting: Student

## 2023-12-30 ENCOUNTER — Telehealth: Payer: Self-pay

## 2023-12-30 DIAGNOSIS — Z992 Dependence on renal dialysis: Secondary | ICD-10-CM | POA: Diagnosis not present

## 2023-12-30 DIAGNOSIS — Z23 Encounter for immunization: Secondary | ICD-10-CM | POA: Diagnosis not present

## 2023-12-30 DIAGNOSIS — T8249XA Other complication of vascular dialysis catheter, initial encounter: Secondary | ICD-10-CM | POA: Diagnosis not present

## 2023-12-30 DIAGNOSIS — N186 End stage renal disease: Secondary | ICD-10-CM | POA: Diagnosis not present

## 2023-12-30 DIAGNOSIS — E1122 Type 2 diabetes mellitus with diabetic chronic kidney disease: Secondary | ICD-10-CM | POA: Diagnosis not present

## 2023-12-30 DIAGNOSIS — N2581 Secondary hyperparathyroidism of renal origin: Secondary | ICD-10-CM | POA: Diagnosis not present

## 2023-12-30 DIAGNOSIS — D509 Iron deficiency anemia, unspecified: Secondary | ICD-10-CM | POA: Diagnosis not present

## 2023-12-30 DIAGNOSIS — D689 Coagulation defect, unspecified: Secondary | ICD-10-CM | POA: Diagnosis not present

## 2023-12-30 MED ORDER — DILTIAZEM HCL ER COATED BEADS 300 MG PO CP24: 300.0000 mg | ORAL_CAPSULE | Freq: Every day | ORAL | 0 refills | Status: DC

## 2023-12-30 MED ORDER — FLUOXETINE HCL 20 MG PO CAPS: 20.0000 mg | ORAL_CAPSULE | Freq: Every day | ORAL | 0 refills | Status: DC

## 2023-12-30 MED ORDER — GABAPENTIN 300 MG PO CAPS: 300.0000 mg | ORAL_CAPSULE | Freq: Every day | ORAL | 0 refills | Status: DC

## 2023-12-30 MED ORDER — TAMSULOSIN HCL 0.4 MG PO CAPS: 0.4000 mg | ORAL_CAPSULE | Freq: Every day | ORAL | 0 refills | Status: DC

## 2023-12-30 NOTE — Addendum Note (Signed)
 Addended by: Massimiliano Rohleder on: 12/30/2023 08:21 PM   Modules accepted: Orders

## 2023-12-30 NOTE — Addendum Note (Signed)
 Addended by: Redmond Whittley C on: 12/30/2023 10:28 AM   Modules accepted: Orders

## 2023-12-30 NOTE — Telephone Encounter (Signed)
 [name] [name] with [facility] calls nurse line requesting verbal orders for the below disciplines.   Nursing and Social Work.   Verbal orders given-VM left.

## 2023-12-31 ENCOUNTER — Other Ambulatory Visit: Payer: Self-pay | Admitting: Student

## 2024-01-01 DIAGNOSIS — E1122 Type 2 diabetes mellitus with diabetic chronic kidney disease: Secondary | ICD-10-CM | POA: Diagnosis not present

## 2024-01-01 DIAGNOSIS — D509 Iron deficiency anemia, unspecified: Secondary | ICD-10-CM | POA: Diagnosis not present

## 2024-01-01 DIAGNOSIS — Z23 Encounter for immunization: Secondary | ICD-10-CM | POA: Diagnosis not present

## 2024-01-01 DIAGNOSIS — D689 Coagulation defect, unspecified: Secondary | ICD-10-CM | POA: Diagnosis not present

## 2024-01-01 DIAGNOSIS — N2581 Secondary hyperparathyroidism of renal origin: Secondary | ICD-10-CM | POA: Diagnosis not present

## 2024-01-01 DIAGNOSIS — T8249XA Other complication of vascular dialysis catheter, initial encounter: Secondary | ICD-10-CM | POA: Diagnosis not present

## 2024-01-01 DIAGNOSIS — N186 End stage renal disease: Secondary | ICD-10-CM | POA: Diagnosis not present

## 2024-01-01 DIAGNOSIS — Z992 Dependence on renal dialysis: Secondary | ICD-10-CM | POA: Diagnosis not present

## 2024-01-03 ENCOUNTER — Telehealth: Payer: Self-pay

## 2024-01-03 DIAGNOSIS — S72001D Fracture of unspecified part of neck of right femur, subsequent encounter for closed fracture with routine healing: Secondary | ICD-10-CM | POA: Diagnosis not present

## 2024-01-03 DIAGNOSIS — N186 End stage renal disease: Secondary | ICD-10-CM | POA: Diagnosis not present

## 2024-01-03 DIAGNOSIS — S72141D Displaced intertrochanteric fracture of right femur, subsequent encounter for closed fracture with routine healing: Secondary | ICD-10-CM | POA: Diagnosis not present

## 2024-01-03 DIAGNOSIS — S72411D Displaced unspecified condyle fracture of lower end of right femur, subsequent encounter for closed fracture with routine healing: Secondary | ICD-10-CM | POA: Diagnosis not present

## 2024-01-03 DIAGNOSIS — I120 Hypertensive chronic kidney disease with stage 5 chronic kidney disease or end stage renal disease: Secondary | ICD-10-CM | POA: Diagnosis not present

## 2024-01-03 DIAGNOSIS — E1122 Type 2 diabetes mellitus with diabetic chronic kidney disease: Secondary | ICD-10-CM | POA: Diagnosis not present

## 2024-01-03 NOTE — Telephone Encounter (Signed)
 Ardis Becton with [facility] calls nurse line in regards to nursing orders.   She reports the patient has refused nursing services at this time.   Advise will make note of this and make PCP aware.

## 2024-01-04 DIAGNOSIS — Z992 Dependence on renal dialysis: Secondary | ICD-10-CM | POA: Diagnosis not present

## 2024-01-04 DIAGNOSIS — N2581 Secondary hyperparathyroidism of renal origin: Secondary | ICD-10-CM | POA: Diagnosis not present

## 2024-01-04 DIAGNOSIS — Z23 Encounter for immunization: Secondary | ICD-10-CM | POA: Diagnosis not present

## 2024-01-04 DIAGNOSIS — D689 Coagulation defect, unspecified: Secondary | ICD-10-CM | POA: Diagnosis not present

## 2024-01-04 DIAGNOSIS — T8249XA Other complication of vascular dialysis catheter, initial encounter: Secondary | ICD-10-CM | POA: Diagnosis not present

## 2024-01-04 DIAGNOSIS — N186 End stage renal disease: Secondary | ICD-10-CM | POA: Diagnosis not present

## 2024-01-04 DIAGNOSIS — D509 Iron deficiency anemia, unspecified: Secondary | ICD-10-CM | POA: Diagnosis not present

## 2024-01-04 DIAGNOSIS — E1122 Type 2 diabetes mellitus with diabetic chronic kidney disease: Secondary | ICD-10-CM | POA: Diagnosis not present

## 2024-01-06 DIAGNOSIS — N2581 Secondary hyperparathyroidism of renal origin: Secondary | ICD-10-CM | POA: Diagnosis not present

## 2024-01-06 DIAGNOSIS — D689 Coagulation defect, unspecified: Secondary | ICD-10-CM | POA: Diagnosis not present

## 2024-01-06 DIAGNOSIS — Z23 Encounter for immunization: Secondary | ICD-10-CM | POA: Diagnosis not present

## 2024-01-06 DIAGNOSIS — Z992 Dependence on renal dialysis: Secondary | ICD-10-CM | POA: Diagnosis not present

## 2024-01-06 DIAGNOSIS — N186 End stage renal disease: Secondary | ICD-10-CM | POA: Diagnosis not present

## 2024-01-06 DIAGNOSIS — D509 Iron deficiency anemia, unspecified: Secondary | ICD-10-CM | POA: Diagnosis not present

## 2024-01-06 DIAGNOSIS — E1122 Type 2 diabetes mellitus with diabetic chronic kidney disease: Secondary | ICD-10-CM | POA: Diagnosis not present

## 2024-01-06 DIAGNOSIS — T8249XA Other complication of vascular dialysis catheter, initial encounter: Secondary | ICD-10-CM | POA: Diagnosis not present

## 2024-01-07 ENCOUNTER — Ambulatory Visit: Payer: 59

## 2024-01-07 ENCOUNTER — Ambulatory Visit (HOSPITAL_COMMUNITY)
Admission: RE | Admit: 2024-01-07 | Discharge: 2024-01-07 | Disposition: A | Discharge status: Home / Self Care | Payer: 59 | Source: Ambulatory Visit | Attending: Vascular Surgery | Admitting: Vascular Surgery

## 2024-01-07 ENCOUNTER — Ambulatory Visit (INDEPENDENT_AMBULATORY_CARE_PROVIDER_SITE_OTHER)
Admission: RE | Admit: 2024-01-07 | Discharge: 2024-01-07 | Disposition: A | Discharge status: Home / Self Care | Payer: 59 | Source: Ambulatory Visit | Attending: Vascular Surgery | Admitting: Vascular Surgery

## 2024-01-07 DIAGNOSIS — N186 End stage renal disease: Secondary | ICD-10-CM

## 2024-01-07 DIAGNOSIS — Z992 Dependence on renal dialysis: Secondary | ICD-10-CM

## 2024-01-08 DIAGNOSIS — N2581 Secondary hyperparathyroidism of renal origin: Secondary | ICD-10-CM | POA: Diagnosis not present

## 2024-01-08 DIAGNOSIS — N186 End stage renal disease: Secondary | ICD-10-CM | POA: Diagnosis not present

## 2024-01-08 DIAGNOSIS — Z992 Dependence on renal dialysis: Secondary | ICD-10-CM | POA: Diagnosis not present

## 2024-01-08 DIAGNOSIS — Z23 Encounter for immunization: Secondary | ICD-10-CM | POA: Diagnosis not present

## 2024-01-08 DIAGNOSIS — D689 Coagulation defect, unspecified: Secondary | ICD-10-CM | POA: Diagnosis not present

## 2024-01-08 DIAGNOSIS — D509 Iron deficiency anemia, unspecified: Secondary | ICD-10-CM | POA: Diagnosis not present

## 2024-01-08 DIAGNOSIS — E1122 Type 2 diabetes mellitus with diabetic chronic kidney disease: Secondary | ICD-10-CM | POA: Diagnosis not present

## 2024-01-08 DIAGNOSIS — T8249XA Other complication of vascular dialysis catheter, initial encounter: Secondary | ICD-10-CM | POA: Diagnosis not present

## 2024-01-10 DIAGNOSIS — S72001D Fracture of unspecified part of neck of right femur, subsequent encounter for closed fracture with routine healing: Secondary | ICD-10-CM | POA: Diagnosis not present

## 2024-01-10 DIAGNOSIS — S72411D Displaced unspecified condyle fracture of lower end of right femur, subsequent encounter for closed fracture with routine healing: Secondary | ICD-10-CM | POA: Diagnosis not present

## 2024-01-10 DIAGNOSIS — I120 Hypertensive chronic kidney disease with stage 5 chronic kidney disease or end stage renal disease: Secondary | ICD-10-CM | POA: Diagnosis not present

## 2024-01-10 DIAGNOSIS — S72141D Displaced intertrochanteric fracture of right femur, subsequent encounter for closed fracture with routine healing: Secondary | ICD-10-CM | POA: Diagnosis not present

## 2024-01-10 DIAGNOSIS — M25532 Pain in left wrist: Secondary | ICD-10-CM | POA: Diagnosis not present

## 2024-01-10 DIAGNOSIS — E1122 Type 2 diabetes mellitus with diabetic chronic kidney disease: Secondary | ICD-10-CM | POA: Diagnosis not present

## 2024-01-10 DIAGNOSIS — N186 End stage renal disease: Secondary | ICD-10-CM | POA: Diagnosis not present

## 2024-01-11 DIAGNOSIS — E1122 Type 2 diabetes mellitus with diabetic chronic kidney disease: Secondary | ICD-10-CM | POA: Diagnosis not present

## 2024-01-11 DIAGNOSIS — Z992 Dependence on renal dialysis: Secondary | ICD-10-CM | POA: Diagnosis not present

## 2024-01-11 DIAGNOSIS — D689 Coagulation defect, unspecified: Secondary | ICD-10-CM | POA: Diagnosis not present

## 2024-01-11 DIAGNOSIS — N186 End stage renal disease: Secondary | ICD-10-CM | POA: Diagnosis not present

## 2024-01-11 DIAGNOSIS — T8249XA Other complication of vascular dialysis catheter, initial encounter: Secondary | ICD-10-CM | POA: Diagnosis not present

## 2024-01-11 DIAGNOSIS — N2581 Secondary hyperparathyroidism of renal origin: Secondary | ICD-10-CM | POA: Diagnosis not present

## 2024-01-11 DIAGNOSIS — Z23 Encounter for immunization: Secondary | ICD-10-CM | POA: Diagnosis not present

## 2024-01-11 DIAGNOSIS — D509 Iron deficiency anemia, unspecified: Secondary | ICD-10-CM | POA: Diagnosis not present

## 2024-01-12 ENCOUNTER — Ambulatory Visit: Payer: 59

## 2024-01-13 DIAGNOSIS — D509 Iron deficiency anemia, unspecified: Secondary | ICD-10-CM | POA: Diagnosis not present

## 2024-01-13 DIAGNOSIS — Z992 Dependence on renal dialysis: Secondary | ICD-10-CM | POA: Diagnosis not present

## 2024-01-13 DIAGNOSIS — N2581 Secondary hyperparathyroidism of renal origin: Secondary | ICD-10-CM | POA: Diagnosis not present

## 2024-01-13 DIAGNOSIS — D689 Coagulation defect, unspecified: Secondary | ICD-10-CM | POA: Diagnosis not present

## 2024-01-13 DIAGNOSIS — Z23 Encounter for immunization: Secondary | ICD-10-CM | POA: Diagnosis not present

## 2024-01-13 DIAGNOSIS — N186 End stage renal disease: Secondary | ICD-10-CM | POA: Diagnosis not present

## 2024-01-13 DIAGNOSIS — E1122 Type 2 diabetes mellitus with diabetic chronic kidney disease: Secondary | ICD-10-CM | POA: Diagnosis not present

## 2024-01-13 DIAGNOSIS — T8249XA Other complication of vascular dialysis catheter, initial encounter: Secondary | ICD-10-CM | POA: Diagnosis not present

## 2024-01-14 ENCOUNTER — Ambulatory Visit (INDEPENDENT_AMBULATORY_CARE_PROVIDER_SITE_OTHER): Payer: 59 | Admitting: Physician Assistant

## 2024-01-14 ENCOUNTER — Telehealth: Payer: Self-pay

## 2024-01-14 VITALS — BP 175/103 | HR 102 | Temp 97.9°F | Wt 230.0 lb

## 2024-01-14 DIAGNOSIS — Z992 Dependence on renal dialysis: Secondary | ICD-10-CM

## 2024-01-14 DIAGNOSIS — N186 End stage renal disease: Secondary | ICD-10-CM

## 2024-01-14 NOTE — Telephone Encounter (Signed)
 Attempted to call for surgery scheduling. LVM

## 2024-01-14 NOTE — H&P (View-Only) (Signed)
 Established Dialysis Access   History of Present Illness   [name] [name] is a 53 y.o. ([birth year]) male who presents for re-evaluation for permanent access.  The patient is right hand dominant.  Previous access procedures have been completed in the left arm. Prior left brachiocephalic AV fistula  in November by Dr. Hetty Blend that occluded due to hypotension events while hospitalized. He continues to dialyze on TTS via right internal jugular TDC.  The patient's PMH, PSH, SH, and FamHx were reviewed and are unchanged from last visit.  Current Outpatient Medications  Medication Sig Dispense Refill   acetaminophen (TYLENOL) 500 MG tablet Take 2 tablets (1,000 mg total) by mouth every 6 (six) hours as needed. 30 tablet 0   AGAMATRIX ULTRA-THIN LANCETS MISC Check blood sugars before meals twice daily 100 each 11   atorvastatin (LIPITOR) 40 MG tablet Take 1 tablet (40 mg total) by mouth at bedtime. (Patient taking differently: Take 40 mg by mouth daily.) 90 tablet 0   Blood Glucose Monitoring Suppl (ACCU-CHEK GUIDE ME) w/Device KIT See admin instructions.     calcitRIOL (ROCALTROL) 0.25 MCG capsule Take 1 capsule (0.25 mcg total) by mouth Every Tuesday,Thursday,and Saturday with dialysis. 30 capsule 0   carvedilol (COREG) 12.5 MG tablet Take 12.5 mg by mouth 2 (two) times daily with a meal.     Darbepoetin Alfa (ARANESP) 150 MCG/0.3ML SOSY injection Inject 0.3 mLs (150 mcg total) into the skin every Saturday at 6 PM.     diclofenac Sodium (VOLTAREN) 1 % GEL Apply 4 g topically 4 (four) times daily. 100 g 0   diltiazem (CARDIZEM CD) 300 MG 24 hr capsule TAKE 1 CAPSULE(300 MG) BY MOUTH DAILY 90 capsule 0   FLUoxetine (PROZAC) 20 MG capsule Take 1 capsule (20 mg total) by mouth daily. 30 capsule 0   gabapentin (NEURONTIN) 300 MG capsule Take 1 capsule (300 mg total) by mouth daily. 30 capsule 0   glucose blood (AGAMATRIX PRESTO TEST) test strip Check blood sugars twice daily before meals 100 each  12   glucose blood test strip 1 each by Other route as needed for other. Use as instructed     hydrALAZINE (APRESOLINE) 50 MG tablet Take 1 tablet (50 mg total) by mouth 2 (two) times daily. 30 tablet 0   Insulin Disposable Pump (OMNIPOD DASH PODS, GEN 4,) MISC      insulin lispro (HUMALOG) 100 UNIT/ML injection 0-6 Units, Subcutaneous, 3 times daily with meals, First dose on Sun 10/17/23 at 0800 Correction coverage: Very Sensitive (ESRD/Dialysis) CBG < 70: Implement Hypoglycemia Standing Orders and refer to Hypoglycemia Standing Orders sidebar report CBG 70 - 120: 0 units CBG 121 - 150: 0 units CBG 151 - 200: 1 unit CBG 201-250: 2 units CBG 251-300: 3 units CBG 301-350: 4 units CBG 351-400: 5 units CBG > 400: Give 6 units and call MD     lidocaine (HM LIDOCAINE PATCH) 4 % Place 1 patch onto the skin daily. 30 patch 0   ondansetron (ZOFRAN-ODT) 4 MG disintegrating tablet Take 1 tablet (4 mg total) by mouth every 8 (eight) hours as needed for nausea or vomiting. 20 tablet 0   polyethylene glycol (MIRALAX / GLYCOLAX) 17 g packet Mix as directed and take 34 g by mouth 3 (three) times daily. 14 each 0   senna (SENOKOT) 8.6 MG TABS tablet Take 1 tablet (8.6 mg total) by mouth 2 (two) times daily. 120 tablet 0   sevelamer carbonate (RENVELA) 800 MG  tablet Take 2 tablets (1,600 mg total) by mouth 3 (three) times daily with meals. 180 tablet 0   tamsulosin (FLOMAX) 0.4 MG CAPS capsule TAKE 1 CAPSULE(0.4 MG) BY MOUTH DAILY AFTER SUPPER 90 capsule 0   tiZANidine (ZANAFLEX) 4 MG tablet Take 0.5 tablets (2 mg total) by mouth daily as needed for muscle spasms. 30 tablet 0   Vitamin D, Ergocalciferol, (DRISDOL) 1.25 MG (50000 UNIT) CAPS capsule Take 1 capsule (50,000 Units total) by mouth every 7 (seven) days. 5 capsule 0   No current facility-administered medications for this visit.    On ROS today: negative unless stated in HPI   Physical Examination   Vitals:   01/14/24 1257  BP: (!) 175/103   Pulse: (!) 102  Temp: 97.9 F (36.6 C)  SpO2: 97%  Weight: 230 lb (104.3 kg)   Body mass index is 27.27 kg/m.  General Chronically ill appearing, in no acute distress  Pulmonary Non labored  Cardiac regular  Vascular 2+ left and right radial pulse,hands warm and well perfused. Left AC incision well healed.   Musculo- skeletal M/S 5/5 throughout , Extremities without ischemic changes    Neurologic A&O; CN grossly intact     Non-invasive Vascular Imaging   bilateral Arm Access Duplex  (01/07/24):  +-----------------+-------------+----------+---------+  Right Cephalic   Diameter (cm)Depth (cm)Findings   +-----------------+-------------+----------+---------+  Prox upper arm       0.38                          +-----------------+-------------+----------+---------+  Mid upper arm        0.30                          +-----------------+-------------+----------+---------+  Dist upper arm       0.33                          +-----------------+-------------+----------+---------+  Antecubital fossa    0.29                          +-----------------+-------------+----------+---------+  Prox forearm         0.22               branching  +-----------------+-------------+----------+---------+  Mid forearm          0.28                          +-----------------+-------------+----------+---------+  Dist forearm         0.23                          +-----------------+-------------+----------+---------+   +-----------------+-------------+----------+--------+  Right Basilic    Diameter (cm)Depth (cm)Findings  +-----------------+-------------+----------+--------+  Mid upper arm        0.27                         +-----------------+-------------+----------+--------+  Dist upper arm       0.29                         +-----------------+-------------+----------+--------+  Antecubital fossa    0.29                          +-----------------+-------------+----------+--------+   +-----------------+-------------+----------+--------+  Left Cephalic    Diameter (cm)Depth (cm)Findings  +-----------------+-------------+----------+--------+  Antecubital fossa                       occluded  +-----------------+-------------+----------+--------+   +-----------------+-------------+----------+--------+  Left Basilic     Diameter (cm)Depth (cm)Findings  +-----------------+-------------+----------+--------+  Prox upper arm       0.48                         +-----------------+-------------+----------+--------+  Mid upper arm        0.47                         +-----------------+-------------+----------+--------+  Dist upper arm       0.36                         +-----------------+-------------+----------+--------+  Antecubital fossa    0.42                         +-----------------+-------------+----------+--------+  Prox forearm         0.21                         +-----------------+-------------+----------+--------+   Summary: Right: Patent and compressible cephalic and basilic veins.  Left: Patent basilic vein.  The cephalic vein is occluded at the antecubital fossa.   VAS US Extremity Arterial Duplex:  01/07/24 Summary:  Right: No obstruction visualized in the right upper extremity.  Left: No obstruction visualized in the left upper extremity.    Medical Decision Making   [name] [name] is a 53 y.o. male who presents for evaluation of new access. He has prior left brachiocephalic AV fistula that occluded shortly after creation due to hypotension event while hospitalized. He has healed well from this surgery. No signs or symptoms of steal. He had arterial duplex which shows patency throughout with triphasic flow. Vein mapping shows adequate left basilic vein for venous conduit in left upper arm. Patient would like to stay in left arm for access. He currently is  dialyzing via catheter on TTS.  Plan will be for left AV fistula creation with Dr. Hetty Blend in near future. I discussed with patient and his sister need for this to be a two stage surgery. They understand and are willing to proceed Will try to arrange surgery in near future on a non dialysis day   Graceann Congress, PA-C [facility] [place] Office: [phone]  Clinic MD: Hetty Blend

## 2024-01-14 NOTE — Progress Notes (Signed)
Established Dialysis Access   History of Present Illness   [name] [name] is a 53 y.o. ([birth year]) male who presents for re-evaluation for permanent access.  The patient is right hand dominant.  Previous access procedures have been completed in the left arm. Prior left brachiocephalic AV fistula  in November by Dr. Hetty Blend that occluded due to hypotension events while hospitalized. He continues to dialyze on TTS via right internal jugular TDC.  The patient's PMH, PSH, SH, and FamHx were reviewed and are unchanged from last visit.  Current Outpatient Medications  Medication Sig Dispense Refill   acetaminophen (TYLENOL) 500 MG tablet Take 2 tablets (1,000 mg total) by mouth every 6 (six) hours as needed. 30 tablet 0   AGAMATRIX ULTRA-THIN LANCETS MISC Check blood sugars before meals twice daily 100 each 11   atorvastatin (LIPITOR) 40 MG tablet Take 1 tablet (40 mg total) by mouth at bedtime. (Patient taking differently: Take 40 mg by mouth daily.) 90 tablet 0   Blood Glucose Monitoring Suppl (ACCU-CHEK GUIDE ME) w/Device KIT See admin instructions.     calcitRIOL (ROCALTROL) 0.25 MCG capsule Take 1 capsule (0.25 mcg total) by mouth Every Tuesday,Thursday,and Saturday with dialysis. 30 capsule 0   carvedilol (COREG) 12.5 MG tablet Take 12.5 mg by mouth 2 (two) times daily with a meal.     Darbepoetin Alfa (ARANESP) 150 MCG/0.3ML SOSY injection Inject 0.3 mLs (150 mcg total) into the skin every Saturday at 6 PM.     diclofenac Sodium (VOLTAREN) 1 % GEL Apply 4 g topically 4 (four) times daily. 100 g 0   diltiazem (CARDIZEM CD) 300 MG 24 hr capsule TAKE 1 CAPSULE(300 MG) BY MOUTH DAILY 90 capsule 0   FLUoxetine (PROZAC) 20 MG capsule Take 1 capsule (20 mg total) by mouth daily. 30 capsule 0   gabapentin (NEURONTIN) 300 MG capsule Take 1 capsule (300 mg total) by mouth daily. 30 capsule 0   glucose blood (AGAMATRIX PRESTO TEST) test strip Check blood sugars twice daily before meals 100 each  12   glucose blood test strip 1 each by Other route as needed for other. Use as instructed     hydrALAZINE (APRESOLINE) 50 MG tablet Take 1 tablet (50 mg total) by mouth 2 (two) times daily. 30 tablet 0   Insulin Disposable Pump (OMNIPOD DASH PODS, GEN 4,) MISC      insulin lispro (HUMALOG) 100 UNIT/ML injection 0-6 Units, Subcutaneous, 3 times daily with meals, First dose on Sun 10/17/23 at 0800 Correction coverage: Very Sensitive (ESRD/Dialysis) CBG < 70: Implement Hypoglycemia Standing Orders and refer to Hypoglycemia Standing Orders sidebar report CBG 70 - 120: 0 units CBG 121 - 150: 0 units CBG 151 - 200: 1 unit CBG 201-250: 2 units CBG 251-300: 3 units CBG 301-350: 4 units CBG 351-400: 5 units CBG > 400: Give 6 units and call MD     lidocaine (HM LIDOCAINE PATCH) 4 % Place 1 patch onto the skin daily. 30 patch 0   ondansetron (ZOFRAN-ODT) 4 MG disintegrating tablet Take 1 tablet (4 mg total) by mouth every 8 (eight) hours as needed for nausea or vomiting. 20 tablet 0   polyethylene glycol (MIRALAX / GLYCOLAX) 17 g packet Mix as directed and take 34 g by mouth 3 (three) times daily. 14 each 0   senna (SENOKOT) 8.6 MG TABS tablet Take 1 tablet (8.6 mg total) by mouth 2 (two) times daily. 120 tablet 0   sevelamer carbonate (RENVELA) 800 MG  tablet Take 2 tablets (1,600 mg total) by mouth 3 (three) times daily with meals. 180 tablet 0   tamsulosin (FLOMAX) 0.4 MG CAPS capsule TAKE 1 CAPSULE(0.4 MG) BY MOUTH DAILY AFTER SUPPER 90 capsule 0   tiZANidine (ZANAFLEX) 4 MG tablet Take 0.5 tablets (2 mg total) by mouth daily as needed for muscle spasms. 30 tablet 0   Vitamin D, Ergocalciferol, (DRISDOL) 1.25 MG (50000 UNIT) CAPS capsule Take 1 capsule (50,000 Units total) by mouth every 7 (seven) days. 5 capsule 0   No current facility-administered medications for this visit.    On ROS today: negative unless stated in HPI   Physical Examination   Vitals:   01/14/24 1257  BP: (!) 175/103   Pulse: (!) 102  Temp: 97.9 F (36.6 C)  SpO2: 97%  Weight: 230 lb (104.3 kg)   Body mass index is 27.27 kg/m.  General Chronically ill appearing, in no acute distress  Pulmonary Non labored  Cardiac regular  Vascular 2+ left and right radial pulse,hands warm and well perfused. Left AC incision well healed.   Musculo- skeletal M/S 5/5 throughout , Extremities without ischemic changes    Neurologic A&O; CN grossly intact     Non-invasive Vascular Imaging   bilateral Arm Access Duplex  (01/07/24):  +-----------------+-------------+----------+---------+  Right Cephalic   Diameter (cm)Depth (cm)Findings   +-----------------+-------------+----------+---------+  Prox upper arm       0.38                          +-----------------+-------------+----------+---------+  Mid upper arm        0.30                          +-----------------+-------------+----------+---------+  Dist upper arm       0.33                          +-----------------+-------------+----------+---------+  Antecubital fossa    0.29                          +-----------------+-------------+----------+---------+  Prox forearm         0.22               branching  +-----------------+-------------+----------+---------+  Mid forearm          0.28                          +-----------------+-------------+----------+---------+  Dist forearm         0.23                          +-----------------+-------------+----------+---------+   +-----------------+-------------+----------+--------+  Right Basilic    Diameter (cm)Depth (cm)Findings  +-----------------+-------------+----------+--------+  Mid upper arm        0.27                         +-----------------+-------------+----------+--------+  Dist upper arm       0.29                         +-----------------+-------------+----------+--------+  Antecubital fossa    0.29                          +-----------------+-------------+----------+--------+   +-----------------+-------------+----------+--------+  Left Cephalic    Diameter (cm)Depth (cm)Findings  +-----------------+-------------+----------+--------+  Antecubital fossa                       occluded  +-----------------+-------------+----------+--------+   +-----------------+-------------+----------+--------+  Left Basilic     Diameter (cm)Depth (cm)Findings  +-----------------+-------------+----------+--------+  Prox upper arm       0.48                         +-----------------+-------------+----------+--------+  Mid upper arm        0.47                         +-----------------+-------------+----------+--------+  Dist upper arm       0.36                         +-----------------+-------------+----------+--------+  Antecubital fossa    0.42                         +-----------------+-------------+----------+--------+  Prox forearm         0.21                         +-----------------+-------------+----------+--------+   Summary: Right: Patent and compressible cephalic and basilic veins.  Left: Patent basilic vein.  The cephalic vein is occluded at the antecubital fossa.   VAS US Extremity Arterial Duplex:  01/07/24 Summary:  Right: No obstruction visualized in the right upper extremity.  Left: No obstruction visualized in the left upper extremity.    Medical Decision Making   [name] [name] is a 53 y.o. male who presents for evaluation of new access. He has prior left brachiocephalic AV fistula that occluded shortly after creation due to hypotension event while hospitalized. He has healed well from this surgery. No signs or symptoms of steal. He had arterial duplex which shows patency throughout with triphasic flow. Vein mapping shows adequate left basilic vein for venous conduit in left upper arm. Patient would like to stay in left arm for access. He currently is  dialyzing via catheter on TTS.  Plan will be for left AV fistula creation with Dr. Hetty Blend in near future. I discussed with patient and his sister need for this to be a two stage surgery. They understand and are willing to proceed Will try to arrange surgery in near future on a non dialysis day   Graceann Congress, PA-C [facility] [place] Office: [phone]  Clinic MD: Hetty Blend

## 2024-01-15 DIAGNOSIS — Z992 Dependence on renal dialysis: Secondary | ICD-10-CM | POA: Diagnosis not present

## 2024-01-15 DIAGNOSIS — T8249XA Other complication of vascular dialysis catheter, initial encounter: Secondary | ICD-10-CM | POA: Diagnosis not present

## 2024-01-15 DIAGNOSIS — D689 Coagulation defect, unspecified: Secondary | ICD-10-CM | POA: Diagnosis not present

## 2024-01-15 DIAGNOSIS — E1122 Type 2 diabetes mellitus with diabetic chronic kidney disease: Secondary | ICD-10-CM | POA: Diagnosis not present

## 2024-01-15 DIAGNOSIS — Z23 Encounter for immunization: Secondary | ICD-10-CM | POA: Diagnosis not present

## 2024-01-15 DIAGNOSIS — D509 Iron deficiency anemia, unspecified: Secondary | ICD-10-CM | POA: Diagnosis not present

## 2024-01-15 DIAGNOSIS — N2581 Secondary hyperparathyroidism of renal origin: Secondary | ICD-10-CM | POA: Diagnosis not present

## 2024-01-15 DIAGNOSIS — N186 End stage renal disease: Secondary | ICD-10-CM | POA: Diagnosis not present

## 2024-01-17 ENCOUNTER — Other Ambulatory Visit: Payer: Self-pay | Admitting: Student

## 2024-01-17 ENCOUNTER — Encounter: Payer: Self-pay | Admitting: Student

## 2024-01-17 ENCOUNTER — Other Ambulatory Visit: Payer: Self-pay

## 2024-01-17 ENCOUNTER — Ambulatory Visit (INDEPENDENT_AMBULATORY_CARE_PROVIDER_SITE_OTHER): Payer: 59 | Admitting: Student

## 2024-01-17 VITALS — BP 156/82 | HR 64 | Ht 77.0 in | Wt 237.1 lb

## 2024-01-17 DIAGNOSIS — E118 Type 2 diabetes mellitus with unspecified complications: Secondary | ICD-10-CM | POA: Diagnosis not present

## 2024-01-17 DIAGNOSIS — R220 Localized swelling, mass and lump, head: Secondary | ICD-10-CM

## 2024-01-17 DIAGNOSIS — Z794 Long term (current) use of insulin: Secondary | ICD-10-CM | POA: Diagnosis not present

## 2024-01-17 DIAGNOSIS — I10 Essential (primary) hypertension: Secondary | ICD-10-CM

## 2024-01-17 DIAGNOSIS — Z992 Dependence on renal dialysis: Secondary | ICD-10-CM

## 2024-01-17 MED ORDER — ATORVASTATIN CALCIUM 40 MG PO TABS: 40.0000 mg | ORAL_TABLET | Freq: Every day | ORAL | 0 refills | Status: DC

## 2024-01-17 NOTE — Progress Notes (Signed)
    SUBJECTIVE:   CHIEF COMPLAINT / HPI:   He is scheduled for a AV fistula revision next week to improve the flow of his dialysis access, which has been problematic. He denies missing any dialysis sessions due to this issue.  The patient also reports swelling and facial puffiness on the left side. His left hand has been swollen due to a recent fracture, which was diagnosed at a trauma center. He has been advised to wear a wrist support, which he has at home but has not been using consistently.  The patient also reports high blood pressure readings, with systolic readings in the 170s to 180s and diastolic readings often in the 100s prior to dialysis on HD days but improves after HD  PERTINENT  PMH / PSH: ESRD on HD  OBJECTIVE:   BP (!) 156/82   Pulse 64   Ht 6\' 5"  (1.956 m)   Wt 237 lb 2 oz (107.6 kg)   SpO2 99%   BMI 28.12 kg/m   General: NAD, awake, alert, responsive to questions Head: Normocephalic atraumatic, mildly poor detention, no abscesses seen on exam, mild tenderness to palpation along left cheek but no fluctuance, erythema, warmth, mild clicking with jaw opening, no lympahdenopathy Resp: breathing comfortably on room air, no iWOB   ASSESSMENT/PLAN:   Assessment & Plan Facial swelling Mild facial swelling on left side, not prominent.  Does not appear to be infectious given no systemic symptoms. -Recommended dental follow-up  -Discussed possible TMJ as jaw pain -Recommended following up if not improving and ED/return precautions -Consider head imaging versus orthopanogram if continuing/worsening    [name], MD [facility]

## 2024-01-17 NOTE — Patient Instructions (Signed)
 It was great to see you! Thank you for allowing me to participate in your care!   Our plans for today:  - For your facial swelling-recommend dental evaluation  - Some of your jaw pain could be related to TMJ but would not explain swelling - If still not getting better in next few weeks please come back fro considering imaging of your head - Return to care if swelling increasing, any shortness of breath, trouble swallowing or fevers  Take care and seek immediate care sooner if you develop any concerns.  [name], MD

## 2024-01-18 DIAGNOSIS — Z23 Encounter for immunization: Secondary | ICD-10-CM | POA: Diagnosis not present

## 2024-01-18 DIAGNOSIS — Z992 Dependence on renal dialysis: Secondary | ICD-10-CM | POA: Diagnosis not present

## 2024-01-18 DIAGNOSIS — T8249XA Other complication of vascular dialysis catheter, initial encounter: Secondary | ICD-10-CM | POA: Diagnosis not present

## 2024-01-18 DIAGNOSIS — D509 Iron deficiency anemia, unspecified: Secondary | ICD-10-CM | POA: Diagnosis not present

## 2024-01-18 DIAGNOSIS — E1122 Type 2 diabetes mellitus with diabetic chronic kidney disease: Secondary | ICD-10-CM | POA: Diagnosis not present

## 2024-01-18 DIAGNOSIS — N2581 Secondary hyperparathyroidism of renal origin: Secondary | ICD-10-CM | POA: Diagnosis not present

## 2024-01-18 DIAGNOSIS — D689 Coagulation defect, unspecified: Secondary | ICD-10-CM | POA: Diagnosis not present

## 2024-01-18 DIAGNOSIS — N186 End stage renal disease: Secondary | ICD-10-CM | POA: Diagnosis not present

## 2024-01-19 DIAGNOSIS — M25561 Pain in right knee: Secondary | ICD-10-CM | POA: Diagnosis not present

## 2024-01-20 DIAGNOSIS — T8249XA Other complication of vascular dialysis catheter, initial encounter: Secondary | ICD-10-CM | POA: Diagnosis not present

## 2024-01-20 DIAGNOSIS — Z23 Encounter for immunization: Secondary | ICD-10-CM | POA: Diagnosis not present

## 2024-01-20 DIAGNOSIS — N186 End stage renal disease: Secondary | ICD-10-CM | POA: Diagnosis not present

## 2024-01-20 DIAGNOSIS — D509 Iron deficiency anemia, unspecified: Secondary | ICD-10-CM | POA: Diagnosis not present

## 2024-01-20 DIAGNOSIS — D689 Coagulation defect, unspecified: Secondary | ICD-10-CM | POA: Diagnosis not present

## 2024-01-20 DIAGNOSIS — N2581 Secondary hyperparathyroidism of renal origin: Secondary | ICD-10-CM | POA: Diagnosis not present

## 2024-01-20 DIAGNOSIS — E1122 Type 2 diabetes mellitus with diabetic chronic kidney disease: Secondary | ICD-10-CM | POA: Diagnosis not present

## 2024-01-20 DIAGNOSIS — Z992 Dependence on renal dialysis: Secondary | ICD-10-CM | POA: Diagnosis not present

## 2024-01-21 DIAGNOSIS — N186 End stage renal disease: Secondary | ICD-10-CM | POA: Diagnosis not present

## 2024-01-21 DIAGNOSIS — I129 Hypertensive chronic kidney disease with stage 1 through stage 4 chronic kidney disease, or unspecified chronic kidney disease: Secondary | ICD-10-CM | POA: Diagnosis not present

## 2024-01-21 DIAGNOSIS — Z992 Dependence on renal dialysis: Secondary | ICD-10-CM | POA: Diagnosis not present

## 2024-01-22 ENCOUNTER — Emergency Department (HOSPITAL_COMMUNITY)

## 2024-01-22 ENCOUNTER — Other Ambulatory Visit: Payer: Self-pay

## 2024-01-22 ENCOUNTER — Inpatient Hospital Stay (HOSPITAL_COMMUNITY)
Admission: EM | Admit: 2024-01-22 | Discharge: 2024-01-26 | DRG: 674 | Disposition: A | Discharge status: Home / Self Care | Attending: Family Medicine | Admitting: Family Medicine

## 2024-01-22 ENCOUNTER — Encounter (HOSPITAL_COMMUNITY): Payer: Self-pay

## 2024-01-22 DIAGNOSIS — R079 Chest pain, unspecified: Secondary | ICD-10-CM | POA: Diagnosis not present

## 2024-01-22 DIAGNOSIS — N2581 Secondary hyperparathyroidism of renal origin: Secondary | ICD-10-CM | POA: Diagnosis present

## 2024-01-22 DIAGNOSIS — Z1152 Encounter for screening for COVID-19: Secondary | ICD-10-CM

## 2024-01-22 DIAGNOSIS — R066 Hiccough: Secondary | ICD-10-CM

## 2024-01-22 DIAGNOSIS — I517 Cardiomegaly: Secondary | ICD-10-CM | POA: Diagnosis not present

## 2024-01-22 DIAGNOSIS — Z8249 Family history of ischemic heart disease and other diseases of the circulatory system: Secondary | ICD-10-CM

## 2024-01-22 DIAGNOSIS — R9431 Abnormal electrocardiogram [ECG] [EKG]: Secondary | ICD-10-CM | POA: Diagnosis present

## 2024-01-22 DIAGNOSIS — D631 Anemia in chronic kidney disease: Secondary | ICD-10-CM | POA: Diagnosis not present

## 2024-01-22 DIAGNOSIS — Z818 Family history of other mental and behavioral disorders: Secondary | ICD-10-CM

## 2024-01-22 DIAGNOSIS — I120 Hypertensive chronic kidney disease with stage 5 chronic kidney disease or end stage renal disease: Principal | ICD-10-CM | POA: Diagnosis present

## 2024-01-22 DIAGNOSIS — E1140 Type 2 diabetes mellitus with diabetic neuropathy, unspecified: Secondary | ICD-10-CM | POA: Diagnosis present

## 2024-01-22 DIAGNOSIS — Z83719 Family history of colon polyps, unspecified: Secondary | ICD-10-CM

## 2024-01-22 DIAGNOSIS — Z992 Dependence on renal dialysis: Secondary | ICD-10-CM | POA: Diagnosis not present

## 2024-01-22 DIAGNOSIS — K8689 Other specified diseases of pancreas: Secondary | ICD-10-CM | POA: Diagnosis not present

## 2024-01-22 DIAGNOSIS — E1151 Type 2 diabetes mellitus with diabetic peripheral angiopathy without gangrene: Secondary | ICD-10-CM | POA: Diagnosis not present

## 2024-01-22 DIAGNOSIS — Z8739 Personal history of other diseases of the musculoskeletal system and connective tissue: Secondary | ICD-10-CM

## 2024-01-22 DIAGNOSIS — Z888 Allergy status to other drugs, medicaments and biological substances status: Secondary | ICD-10-CM

## 2024-01-22 DIAGNOSIS — Z860101 Personal history of adenomatous and serrated colon polyps: Secondary | ICD-10-CM | POA: Diagnosis not present

## 2024-01-22 DIAGNOSIS — N186 End stage renal disease: Secondary | ICD-10-CM | POA: Diagnosis not present

## 2024-01-22 DIAGNOSIS — R262 Difficulty in walking, not elsewhere classified: Secondary | ICD-10-CM | POA: Diagnosis not present

## 2024-01-22 DIAGNOSIS — Z833 Family history of diabetes mellitus: Secondary | ICD-10-CM

## 2024-01-22 DIAGNOSIS — K529 Noninfective gastroenteritis and colitis, unspecified: Secondary | ICD-10-CM | POA: Diagnosis present

## 2024-01-22 DIAGNOSIS — I152 Hypertension secondary to endocrine disorders: Secondary | ICD-10-CM | POA: Diagnosis present

## 2024-01-22 DIAGNOSIS — Z794 Long term (current) use of insulin: Secondary | ICD-10-CM | POA: Diagnosis not present

## 2024-01-22 DIAGNOSIS — Z560 Unemployment, unspecified: Secondary | ICD-10-CM | POA: Diagnosis not present

## 2024-01-22 DIAGNOSIS — R6881 Early satiety: Secondary | ICD-10-CM | POA: Diagnosis present

## 2024-01-22 DIAGNOSIS — T8249XA Other complication of vascular dialysis catheter, initial encounter: Secondary | ICD-10-CM | POA: Diagnosis not present

## 2024-01-22 DIAGNOSIS — R000 Tachycardia, unspecified: Secondary | ICD-10-CM | POA: Diagnosis not present

## 2024-01-22 DIAGNOSIS — I499 Cardiac arrhythmia, unspecified: Secondary | ICD-10-CM | POA: Diagnosis not present

## 2024-01-22 DIAGNOSIS — E860 Dehydration: Secondary | ICD-10-CM | POA: Diagnosis present

## 2024-01-22 DIAGNOSIS — Z5986 Financial insecurity: Secondary | ICD-10-CM

## 2024-01-22 DIAGNOSIS — K219 Gastro-esophageal reflux disease without esophagitis: Secondary | ICD-10-CM | POA: Diagnosis present

## 2024-01-22 DIAGNOSIS — M25561 Pain in right knee: Secondary | ICD-10-CM | POA: Diagnosis not present

## 2024-01-22 DIAGNOSIS — D689 Coagulation defect, unspecified: Secondary | ICD-10-CM | POA: Diagnosis not present

## 2024-01-22 DIAGNOSIS — E785 Hyperlipidemia, unspecified: Secondary | ICD-10-CM | POA: Diagnosis present

## 2024-01-22 DIAGNOSIS — F17210 Nicotine dependence, cigarettes, uncomplicated: Secondary | ICD-10-CM | POA: Diagnosis present

## 2024-01-22 DIAGNOSIS — I10 Essential (primary) hypertension: Secondary | ICD-10-CM | POA: Diagnosis present

## 2024-01-22 DIAGNOSIS — E119 Type 2 diabetes mellitus without complications: Secondary | ICD-10-CM

## 2024-01-22 DIAGNOSIS — Z791 Long term (current) use of non-steroidal anti-inflammatories (NSAID): Secondary | ICD-10-CM

## 2024-01-22 DIAGNOSIS — K869 Disease of pancreas, unspecified: Secondary | ICD-10-CM | POA: Diagnosis not present

## 2024-01-22 DIAGNOSIS — R1110 Vomiting, unspecified: Secondary | ICD-10-CM | POA: Diagnosis not present

## 2024-01-22 DIAGNOSIS — R6889 Other general symptoms and signs: Secondary | ICD-10-CM | POA: Diagnosis not present

## 2024-01-22 DIAGNOSIS — Z5941 Food insecurity: Secondary | ICD-10-CM

## 2024-01-22 DIAGNOSIS — R933 Abnormal findings on diagnostic imaging of other parts of digestive tract: Secondary | ICD-10-CM | POA: Diagnosis not present

## 2024-01-22 DIAGNOSIS — Z79899 Other long term (current) drug therapy: Secondary | ICD-10-CM | POA: Diagnosis not present

## 2024-01-22 DIAGNOSIS — R112 Nausea with vomiting, unspecified: Secondary | ICD-10-CM

## 2024-01-22 DIAGNOSIS — K5909 Other constipation: Secondary | ICD-10-CM | POA: Diagnosis present

## 2024-01-22 DIAGNOSIS — E1122 Type 2 diabetes mellitus with diabetic chronic kidney disease: Secondary | ICD-10-CM | POA: Diagnosis not present

## 2024-01-22 DIAGNOSIS — D696 Thrombocytopenia, unspecified: Secondary | ICD-10-CM | POA: Diagnosis present

## 2024-01-22 DIAGNOSIS — K8020 Calculus of gallbladder without cholecystitis without obstruction: Secondary | ICD-10-CM | POA: Diagnosis present

## 2024-01-22 DIAGNOSIS — Z89511 Acquired absence of right leg below knee: Secondary | ICD-10-CM | POA: Diagnosis not present

## 2024-01-22 DIAGNOSIS — R1111 Vomiting without nausea: Secondary | ICD-10-CM | POA: Diagnosis not present

## 2024-01-22 DIAGNOSIS — Z83438 Family history of other disorder of lipoprotein metabolism and other lipidemia: Secondary | ICD-10-CM

## 2024-01-22 DIAGNOSIS — Z9109 Other allergy status, other than to drugs and biological substances: Secondary | ICD-10-CM

## 2024-01-22 DIAGNOSIS — F32A Depression, unspecified: Secondary | ICD-10-CM | POA: Insufficient documentation

## 2024-01-22 DIAGNOSIS — I160 Hypertensive urgency: Secondary | ICD-10-CM | POA: Diagnosis not present

## 2024-01-22 DIAGNOSIS — R109 Unspecified abdominal pain: Secondary | ICD-10-CM | POA: Diagnosis present

## 2024-01-22 DIAGNOSIS — Z23 Encounter for immunization: Secondary | ICD-10-CM | POA: Diagnosis not present

## 2024-01-22 DIAGNOSIS — F329 Major depressive disorder, single episode, unspecified: Secondary | ICD-10-CM | POA: Diagnosis present

## 2024-01-22 DIAGNOSIS — I169 Hypertensive crisis, unspecified: Secondary | ICD-10-CM | POA: Diagnosis not present

## 2024-01-22 DIAGNOSIS — I770 Arteriovenous fistula, acquired: Secondary | ICD-10-CM | POA: Insufficient documentation

## 2024-01-22 DIAGNOSIS — D509 Iron deficiency anemia, unspecified: Secondary | ICD-10-CM | POA: Diagnosis not present

## 2024-01-22 HISTORY — DX: End stage renal disease: N18.6

## 2024-01-22 HISTORY — DX: End stage renal disease: Z99.2

## 2024-01-22 LAB — CBC WITH DIFFERENTIAL/PLATELET
Abs Immature Granulocytes: 0.03 10*3/uL (ref 0.00–0.07)
Basophils Absolute: 0 10*3/uL (ref 0.0–0.1)
Basophils Relative: 0 %
Eosinophils Absolute: 0.1 10*3/uL (ref 0.0–0.5)
Eosinophils Relative: 1 %
HCT: 39 % (ref 39.0–52.0)
Hemoglobin: 12.1 g/dL — ABNORMAL LOW (ref 13.0–17.0)
Immature Granulocytes: 0 %
Lymphocytes Relative: 11 %
Lymphs Abs: 0.9 10*3/uL (ref 0.7–4.0)
MCH: 26.7 pg (ref 26.0–34.0)
MCHC: 31 g/dL (ref 30.0–36.0)
MCV: 85.9 fL (ref 80.0–100.0)
Monocytes Absolute: 0.4 10*3/uL (ref 0.1–1.0)
Monocytes Relative: 5 %
Neutro Abs: 7 10*3/uL (ref 1.7–7.7)
Neutrophils Relative %: 83 %
Platelets: 120 10*3/uL — ABNORMAL LOW (ref 150–400)
RBC: 4.54 MIL/uL (ref 4.22–5.81)
RDW: 19.1 % — ABNORMAL HIGH (ref 11.5–15.5)
WBC: 8.5 10*3/uL (ref 4.0–10.5)
nRBC: 0 % (ref 0.0–0.2)

## 2024-01-22 LAB — COMPREHENSIVE METABOLIC PANEL
ALT: 10 U/L (ref 0–44)
AST: 13 U/L — ABNORMAL LOW (ref 15–41)
Albumin: 2.9 g/dL — ABNORMAL LOW (ref 3.5–5.0)
Alkaline Phosphatase: 92 U/L (ref 38–126)
Anion gap: 14 (ref 5–15)
BUN: 13 mg/dL (ref 6–20)
CO2: 29 mmol/L (ref 22–32)
Calcium: 8.8 mg/dL — ABNORMAL LOW (ref 8.9–10.3)
Chloride: 96 mmol/L — ABNORMAL LOW (ref 98–111)
Creatinine, Ser: 4.03 mg/dL — ABNORMAL HIGH (ref 0.61–1.24)
GFR, Estimated: 17 mL/min — ABNORMAL LOW (ref >60)
Glucose, Bld: 173 mg/dL — ABNORMAL HIGH (ref 70–99)
Potassium: 3.7 mmol/L (ref 3.5–5.1)
Sodium: 139 mmol/L (ref 135–145)
Total Bilirubin: 0.7 mg/dL (ref 0.0–1.2)
Total Protein: 5.9 g/dL — ABNORMAL LOW (ref 6.5–8.1)

## 2024-01-22 LAB — TROPONIN I (HIGH SENSITIVITY)
Troponin I (High Sensitivity): 36 ng/L — ABNORMAL HIGH (ref <18)
Troponin I (High Sensitivity): 37 ng/L — ABNORMAL HIGH (ref <18)

## 2024-01-22 MED ORDER — HEPARIN SODIUM (PORCINE) 5000 UNIT/ML IJ SOLN
5000.0000 [IU] | Freq: Three times a day (TID) | INTRAMUSCULAR | Status: DC
  Administered 2024-01-23 – 2024-01-25 (×8): 5000 [IU] via SUBCUTANEOUS
  Filled 2024-01-22 (×8): qty 1

## 2024-01-22 MED ORDER — HYDRALAZINE HCL 20 MG/ML IJ SOLN
10.0000 mg | INTRAMUSCULAR | Status: DC | PRN
  Administered 2024-01-23: 10 mg via INTRAVENOUS
  Filled 2024-01-22: qty 1

## 2024-01-22 MED ORDER — HYDRALAZINE HCL 20 MG/ML IJ SOLN
10.0000 mg | Freq: Once | INTRAMUSCULAR | Status: AC
  Administered 2024-01-22: 10 mg via INTRAVENOUS
  Filled 2024-01-22 (×2): qty 1

## 2024-01-22 MED ORDER — INSULIN ASPART 100 UNIT/ML IJ SOLN
0.0000 [IU] | Freq: Three times a day (TID) | INTRAMUSCULAR | Status: DC
  Administered 2024-01-23 (×2): 1 [IU] via SUBCUTANEOUS
  Administered 2024-01-24: 2 [IU] via SUBCUTANEOUS
  Administered 2024-01-25: 1 [IU] via SUBCUTANEOUS

## 2024-01-22 MED ORDER — HYDRALAZINE HCL 20 MG/ML IJ SOLN
10.0000 mg | Freq: Once | INTRAMUSCULAR | Status: AC
  Administered 2024-01-22: 10 mg via INTRAVENOUS
  Filled 2024-01-22: qty 1

## 2024-01-22 MED ORDER — CARVEDILOL 12.5 MG PO TABS
12.5000 mg | ORAL_TABLET | Freq: Once | ORAL | Status: DC
  Filled 2024-01-22: qty 1

## 2024-01-22 MED ORDER — SODIUM CHLORIDE 0.9 % IV SOLN
25.0000 mg | Freq: Once | INTRAVENOUS | Status: AC
  Administered 2024-01-22: 25 mg via INTRAVENOUS
  Filled 2024-01-22: qty 1

## 2024-01-22 MED ORDER — SODIUM CHLORIDE 0.9 % IV SOLN
8.0000 mg | Freq: Three times a day (TID) | INTRAVENOUS | Status: DC | PRN
  Administered 2024-01-23: 8 mg via INTRAVENOUS
  Filled 2024-01-22: qty 4

## 2024-01-22 MED ORDER — ONDANSETRON 4 MG PO TBDP
8.0000 mg | ORAL_TABLET | Freq: Three times a day (TID) | ORAL | Status: DC | PRN
  Administered 2024-01-23: 8 mg via ORAL
  Filled 2024-01-22 (×2): qty 2

## 2024-01-22 MED ORDER — METOPROLOL TARTRATE 5 MG/5ML IV SOLN
5.0000 mg | Freq: Once | INTRAVENOUS | Status: AC
  Administered 2024-01-23: 5 mg via INTRAVENOUS
  Filled 2024-01-22: qty 5

## 2024-01-22 MED ORDER — ONDANSETRON HCL 4 MG/2ML IJ SOLN
4.0000 mg | Freq: Once | INTRAMUSCULAR | Status: AC
  Administered 2024-01-22: 4 mg via INTRAVENOUS
  Filled 2024-01-22: qty 2

## 2024-01-22 MED ORDER — ACETAMINOPHEN 325 MG PO TABS
650.0000 mg | ORAL_TABLET | Freq: Four times a day (QID) | ORAL | Status: DC | PRN
  Filled 2024-01-22: qty 2

## 2024-01-22 MED ORDER — SODIUM CHLORIDE 0.9 % IV BOLUS
250.0000 mL | Freq: Once | INTRAVENOUS | Status: AC
  Administered 2024-01-23: 250 mL via INTRAVENOUS

## 2024-01-22 NOTE — Assessment & Plan Note (Signed)
 Received full HD session today. CMP reassuring. Fluid down on exam. - Will need consult to Nephrology for HD if still admitted

## 2024-01-22 NOTE — ED Provider Notes (Signed)
 [place] EMERGENCY DEPARTMENT AT [facility] Provider Note   CSN: [id number] Arrival date & time: 01/22/24  1751     History  Chief Complaint  Patient presents with   Hypertension   Chest Pain   Cough   Emesis    [name] [name] is a 53 y.o. male history of hypertension, ESRD on dialysis, here presenting with hypertension and chest pain and cough and vomiting.  Patient went to dialysis center and had a full treatment.  Patient then developed some chest pain and coughing while at dialysis.  Patient was noted to be hypertensive with blood pressure over 200.  Patient states that he took his BP meds before dialysis.  Patient was admitted to family practice for abdominal pain and hypotension and confusion back in January.  The history is provided by the patient.       Home Medications Prior to Admission medications   Medication Sig Start Date End Date Taking? Authorizing Provider  acetaminophen (TYLENOL) 500 MG tablet Take 2 tablets (1,000 mg total) by mouth every 6 (six) hours as needed. 10/26/23   Baloch, Mahnoor, MD  Chad Cordial ULTRA-THIN LANCETS MISC Check blood sugars before meals twice daily 11/19/17   Julieanne Manson, MD  atorvastatin (LIPITOR) 40 MG tablet Take 1 tablet (40 mg total) by mouth at bedtime. 01/17/24   [name], MD  Blood Glucose Monitoring Suppl (ACCU-CHEK GUIDE ME) w/Device KIT See admin instructions. 02/23/23   [provider]  calcitRIOL (ROCALTROL) 0.25 MCG capsule Take 1 capsule (0.25 mcg total) by mouth Every Tuesday,Thursday,and Saturday with dialysis. 10/26/23   Baloch, Mahnoor, MD  carvedilol (COREG) 12.5 MG tablet Take 12.5 mg by mouth 2 (two) times daily with a meal.    [provider]  Darbepoetin Alfa (ARANESP) 150 MCG/0.3ML SOSY injection Inject 0.3 mLs (150 mcg total) into the skin every Saturday at 6 PM. 10/30/23   Baloch, Mahnoor, MD  diclofenac Sodium (VOLTAREN) 1 % GEL Apply 4 g topically 4 (four) times  daily. Patient taking differently: Apply 4 g topically 4 (four) times daily as needed (pain). 11/29/23   [name], MD  diltiazem (CARDIZEM CD) 300 MG 24 hr capsule TAKE 1 CAPSULE(300 MG) BY MOUTH DAILY 12/31/23   [name], MD  FLUoxetine (PROZAC) 20 MG capsule Take 1 capsule (20 mg total) by mouth daily. 12/30/23   [name], MD  gabapentin (NEURONTIN) 300 MG capsule Take 1 capsule (300 mg total) by mouth daily. 12/30/23   [name], MD  glucose blood (AGAMATRIX PRESTO TEST) test strip Check blood sugars twice daily before meals 02/02/18   Julieanne Manson, MD  glucose blood test strip 1 each by Other route as needed for other. Use as instructed    [provider]  hydrALAZINE (APRESOLINE) 50 MG tablet Take 1 tablet (50 mg total) by mouth 2 (two) times daily. Patient taking differently: Take 75 mg by mouth 2 (two) times daily. 10/26/23   Baloch, Mahnoor, MD  Insulin Disposable Pump (OMNIPOD DASH PODS, GEN 4,) MISC  04/15/22   [provider]  insulin lispro (HUMALOG) 100 UNIT/ML injection 0-6 Units, Subcutaneous, 3 times daily with meals, First dose on Sun 10/17/23 at 0800 Correction coverage: Very Sensitive (ESRD/Dialysis) CBG < 70: Implement Hypoglycemia Standing Orders and refer to Hypoglycemia Standing Orders sidebar report CBG 70 - 120: 0 units CBG 121 - 150: 0 units CBG 151 - 200: 1 unit CBG 201-250: 2 units CBG 251-300: 3 units CBG 301-350: 4 units CBG  351-400: 5 units CBG > 400: Give 6 units and call MD Patient taking differently: 0-6 Units, Subcutaneous, 3 times daily with meals, as needed  Correction coverage: Very Sensitive (ESRD/Dialysis) CBG < 70: Implement Hypoglycemia Standing Orders and refer to Hypoglycemia Standing Orders sidebar report CBG 70 - 120: 0 units CBG 121 - 150: 0 units CBG 151 - 200: 1 unit CBG 201-250: 2 units CBG 251-300: 3 units CBG 301-350: 4 units CBG 351-400: 5 units CBG > 400: Give 6 units and call MD 10/26/23    Georg Ruddle, Mahnoor, MD  lidocaine (HM LIDOCAINE PATCH) 4 % Place 1 patch onto the skin daily. Patient taking differently: Place 1 patch onto the skin daily as needed (pain). 11/29/23   [name], MD  meloxicam (MOBIC) 15 MG tablet Take 15 mg by mouth daily.    [provider]  ondansetron (ZOFRAN-ODT) 4 MG disintegrating tablet Take 1 tablet (4 mg total) by mouth every 8 (eight) hours as needed for nausea or vomiting. 12/12/23   [name], MD  oxyCODONE-acetaminophen (PERCOCET) 10-325 MG tablet Take 1 tablet by mouth every 8 (eight) hours as needed for pain. 01/13/24   [provider]  sevelamer carbonate (RENVELA) 800 MG tablet Take 2 tablets (1,600 mg total) by mouth 3 (three) times daily with meals. Patient taking differently: Take 1,600 mg by mouth 2 (two) times daily with a meal. 10/26/23   Baloch, Mahnoor, MD  tamsulosin (FLOMAX) 0.4 MG CAPS capsule TAKE 1 CAPSULE(0.4 MG) BY MOUTH DAILY AFTER SUPPER Patient taking differently: Take 0.4 mg by mouth daily as needed (bladder control). 12/31/23   [name], MD  tiZANidine (ZANAFLEX) 4 MG tablet TAKE 1/2 TABLET(2 MG) BY MOUTH DAILY AS NEEDED FOR MUSCLE SPASMS Patient taking differently: Take 4 mg by mouth daily as needed for muscle spasms. 01/17/24   [name], MD      Allergies    Iron and Tape    Review of Systems   Review of Systems  Respiratory:  Positive for cough.   Cardiovascular:  Positive for chest pain.  Gastrointestinal:  Positive for abdominal pain and vomiting.  All other systems reviewed and are negative.   Physical Exam Updated Vital Signs BP (!) 195/93   Pulse 84   Temp 98.9 F (37.2 C)   Resp 18   SpO2 97%  Physical Exam Vitals and nursing note reviewed.  Constitutional:      Comments: Chronically ill and vomiting  HENT:     Head: Normocephalic.  Eyes:     Extraocular Movements: Extraocular movements intact.     Pupils: Pupils are equal, round, and reactive to light.   Cardiovascular:     Rate and Rhythm: Normal rate and regular rhythm.     Heart sounds: Normal heart sounds.  Pulmonary:     Effort: Pulmonary effort is normal.     Breath sounds: Normal breath sounds.  Abdominal:     Comments: Mild epigastric tenderness  Musculoskeletal:        General: Normal range of motion.     Cervical back: Normal range of motion and neck supple.  Skin:    General: Skin is warm.     Capillary Refill: Capillary refill takes less than 2 seconds.  Neurological:     General: No focal deficit present.     Mental Status: He is oriented to person, place, and time.  Psychiatric:        Mood and Affect: Mood normal.  Behavior: Behavior normal.     ED Results / Procedures / Treatments   Labs (all labs ordered are listed, but only abnormal results are displayed) Labs Reviewed  CBC WITH DIFFERENTIAL/PLATELET - Abnormal; Notable for the following components:      Result Value   Hemoglobin 12.1 (*)    RDW 19.1 (*)    Platelets 120 (*)    All other components within normal limits  COMPREHENSIVE METABOLIC PANEL - Abnormal; Notable for the following components:   Chloride 96 (*)    Glucose, Bld 173 (*)    Creatinine, Ser 4.03 (*)    Calcium 8.8 (*)    Total Protein 5.9 (*)    Albumin 2.9 (*)    AST 13 (*)    GFR, Estimated 17 (*)    All other components within normal limits  TROPONIN I (HIGH SENSITIVITY) - Abnormal; Notable for the following components:   Troponin I (High Sensitivity) 36 (*)    All other components within normal limits  TROPONIN I (HIGH SENSITIVITY)    EKG EKG Interpretation Date/Time:  Saturday January 22 2024 17:55:28 EST Ventricular Rate:  81 PR Interval:  164 QRS Duration:  80 QT Interval:  398 QTC Calculation: 462 R Axis:   68  Text Interpretation: Normal sinus rhythm Possible Left atrial enlargement Septal infarct , age undetermined Abnormal ECG When compared with ECG of 01-Dec-2023 10:56, No significant change since last  tracing Confirmed by Richardean Canal [phone]) on 01/22/2024 6:39:32 PM  Radiology CT ABDOMEN PELVIS WO CONTRAST Result Date: 01/22/2024 CLINICAL DATA:  Abdominal pain, bowel obstruction suspected, vomiting. EXAM: CT ABDOMEN AND PELVIS WITHOUT CONTRAST TECHNIQUE: Multidetector CT imaging of the abdomen and pelvis was performed following the standard protocol without IV contrast. RADIATION DOSE REDUCTION: This exam was performed according to the departmental dose-optimization program which includes automated exposure control, adjustment of the mA and/or kV according to patient size and/or use of iterative reconstruction technique. COMPARISON:  MRI 12/01/2023 and CT abdomen and pelvis 11/30/2023 FINDINGS: Lower chest: No acute abnormality. Hepatobiliary: Unremarkable liver. Cholelithiasis. No evidence of acute cholecystitis. No biliary dilation. 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 Spleen: Unremarkable. Adrenals/Urinary Tract: Stable adrenal glands including left adrenal adenoma. No follow-up recommended. No urinary calculi or hydronephrosis. Bladder is unremarkable. Stomach/Bowel: Normal caliber large and small bowel. Mild wall thickening with pericolonic fat stranding about the ascending colon. Additional mild wall thickening of the transverse and descending colon. The appendix is normal.Stomach is within normal limits. Vascular/Lymphatic: Aortic atherosclerosis. No enlarged abdominal or pelvic lymph nodes. Reproductive: Unremarkable. Other: No free intraperitoneal fluid or air.  Body wall edema. Musculoskeletal: No acute fracture. Findings rod and screw fixation across a right intertrochanteric fracture. Fracture line remains visible. IMPRESSION: 1. Nonspecific colitis of the ascending, transverse and descending colon. 2. Cholelithiasis without evidence of acute cholecystitis. 3. Body wall edema. 4. Aortic Atherosclerosis  (ICD10-I70.0). Electronically Signed   By: Minerva Fester M.D.   On: 01/22/2024 22:15   DG Chest Port 1 View Result Date: 01/22/2024 CLINICAL DATA:  Chest pain EXAM: PORTABLE CHEST 1 VIEW COMPARISON:  11/30/2023 FINDINGS: Right-sided central venous catheter tip at the cavoatrial junction. Cardiomegaly. No acute airspace disease, pleural effusion or pneumothorax IMPRESSION: No active disease.  Cardiomegaly Electronically Signed   By: Jasmine Pang M.D.   On: 01/22/2024 20:44    Procedures Procedures    Medications Ordered in ED Medications  carvedilol (COREG)  tablet 12.5 mg (0 mg Oral Hold 01/22/24 2057)  hydrALAZINE (APRESOLINE) injection 10 mg (10 mg Intravenous Given 01/22/24 2012)  ondansetron (ZOFRAN) injection 4 mg (4 mg Intravenous Given 01/22/24 2012)  promethazine (PHENERGAN) 25 mg in sodium chloride 0.9 % 50 mL IVPB (0 mg Intravenous Stopped 01/22/24 2130)  hydrALAZINE (APRESOLINE) injection 10 mg (10 mg Intravenous Given 01/22/24 2053)    ED Course/ Medical Decision Making/ A&P                                 Medical Decision Making [name] [name] is a 53 y.o. male here presenting with hypertension and vomiting and chest pain.  Likely symptomatic hypertension versus ACS.  Low suspicion for dissection.  Abdomen is also tender so we will get CT abdomen pelvis to rule out SBO.  Plan to get CBC and CMP and troponin x 2 and chest x-ray and CT abdomen pelvis.  Will give BP meds and nausea meds  10:20 PM I reviewed patient's labs and creatinine is 4 which is baseline.  Troponin is 36.  CT abdomen pelvis showed nonspecific colitis which is unchanged from previous.  Patient is still hypertensive unable to tolerate p.o.  Will admit for hypertensive urgency, intractable vomiting.  Problems Addressed: ESRD (end stage renal disease) on dialysis [facility]): acute illness or injury Hypertensive urgency: acute illness or injury Nausea and vomiting, unspecified vomiting type: acute illness or  injury  Amount and/or Complexity of Data Reviewed Labs: ordered. Decision-making details documented in ED Course. Radiology: ordered and independent interpretation performed. Decision-making details documented in ED Course. ECG/medicine tests: ordered and independent interpretation performed. Decision-making details documented in ED Course.  Risk Prescription drug management.    Final Clinical Impression(s) / ED Diagnoses Final diagnoses:  None    Rx / DC Orders ED Discharge Orders     None         Charlynne Pander, MD 01/22/24 2221

## 2024-01-22 NOTE — Assessment & Plan Note (Signed)
 CTAP showed nonspecific colitis, which could be from viral gastroenteritis. Appears that he has chronic abm pain that has had benign workup in the past.  - CTM

## 2024-01-22 NOTE — ED Notes (Signed)
 CCMD called.

## 2024-01-22 NOTE — Assessment & Plan Note (Signed)
 Appears dehydrated on exam. Will replete fluids cautiously given ESRD on HD. S/p IV zofran and phenergan in ED and still vomiting during exam. - NS 250cc bolus - Zofran 8mg  prn for vomiting - Encouraged PO

## 2024-01-22 NOTE — Assessment & Plan Note (Signed)
 Will need to be cautious with what regimen we discharge him on given pt was recently admitted 1/7 for hypotension. There is also confusion if pt is taking both metoprolol (25 XR daily) and coreg (12.5 BID); it looks like he has been filling them and reports taking them both, but it looks like metoprolol was supposed to be discontinued at his 1/7 admission. His home regimen also includes hydralazine and diltiazem. S/p IV hydral 10mg  x2 in ED but BP remains uncontrolled. - Will give metoprolol IV 5mg  once  - IV hydralazine 10mg  q4h prn  - Holding home Diltiazem - Resume home BP meds once able to tolerate PO - May benefit from increasing Hydralazine on non-HD days to better control HTN while limiting risk of hypotension.  - Consider outpt ambulatory BP monitoring

## 2024-01-22 NOTE — Hospital Course (Addendum)
 [name] [name] is a 53 y.o.male with a history of ESRD on HD, DM2, HLD, HTN, PAD, secondary hyperparathyroidism who was admitted to the [facility] Medicine Teaching Service at [facility] for intractable nausea and vomiting and severe HTN. His hospital course is detailed below:  Intractable Vomiting/Abd pain/Hiccups Unclear etiology. CT abd w/ contrast with nonspecific colitis of ascending, transverse, and descending colon, cholelithiasis. Pt was started on IV PPI, Zofran, and Reglan for symptom control. GI consulted who recommended ***.   Acute severe HTN BP on admission 200s/100s. Home BP medication regimen switched to IV while pt actively nauseous/vomiting.    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 Ensure GI follow up.

## 2024-01-22 NOTE — ED Triage Notes (Signed)
 Pt bib ems from dialysis center, pt received his full treatment. Pt c.o chest pain when he coughs and n/v. Pt is also very hypertensive 220/120. Pt a.o

## 2024-01-22 NOTE — Assessment & Plan Note (Signed)
-   Admit to FMTS w/ attending Dr. Lum [name] to progressive - Tylenol prn for pain - Trend trops - RPP and covid swab - AM CBC, CMP

## 2024-01-22 NOTE — H&P (Cosign Needed)
 Hospital Admission History and Physical Service Pager: [phone]  Patient name: [name] [name] Medical record number: [id number] Date of Birth: 17-Sep-1971 Age: 53 y.o. Gender: male  Primary Care Provider: [name], MD Consultants: None Code Status:Full Preferred Emergency Contact:  Contact Information     Name Relation Home Work Mobile   Caroleen Sister   [phone]   [facility] Mother [phone]  [phone]      Other Contacts   None on File      Chief Complaint: Vomiting and chest pain  Assessment and Plan: [name] BARCELLOS is a 53 y.o. male presenting with chest pain, intractable vomiting, and uncontrolled HTN.   Differential for current presentation includes viral illness, costochondritis, hypertensive emergency, ACS, PE, aortic dissection. Viral illness and costochondritis are most likely given recent few days of viral-illness sick symptoms, association of chest pain w/ coughing, reproducibility of chest pain with palpation, benign CXR. Will check RPP. Hypertensive emergency is considered given uncontrolled BP when CP started and mild increase in trop (could be injury from HTN or demand ischemia). Will treat HTN and trend trops. ACS, PE, and aortic dissection are less likely given normal WOB on RA, normal HR, no ST changes on EKG.  Assessment & Plan Chest pain - Admit to FMTS w/ attending Dr. Lum [name] to progressive - Tylenol prn for pain - Trend trops - RPP and covid swab - AM CBC, CMP Intractable vomiting Appears dehydrated on exam. Will replete fluids cautiously given ESRD on HD. S/p IV zofran and phenergan in ED and still vomiting during exam. - NS 250cc bolus - Zofran 8mg  prn for vomiting - Encouraged PO Abdominal pain CTAP showed nonspecific colitis, which could be from viral gastroenteritis. Appears that he has chronic abm pain that has had benign workup in the past.  - CTM Uncontrolled hypertension Will need to be cautious  with what regimen we discharge him on given pt was recently admitted 1/7 for hypotension. There is also confusion if pt is taking both metoprolol (25 XR daily) and coreg (12.5 BID); it looks like he has been filling them and reports taking them both, but it looks like metoprolol was supposed to be discontinued at his 1/7 admission. His home regimen also includes hydralazine and diltiazem. S/p IV hydral 10mg  x2 in ED but BP remains uncontrolled. - Will give metoprolol IV 5mg  once  - IV hydralazine 10mg  q4h prn  - Holding home Diltiazem - Resume home BP meds once able to tolerate PO - May benefit from increasing Hydralazine on non-HD days to better control HTN while limiting risk of hypotension.  - Consider outpt ambulatory BP monitoring ESRD (end stage renal disease) on dialysis (HCC) Received full HD session today. CMP reassuring. Fluid down on exam. - Will need consult to Nephrology for HD if still admitted     Chronic and Stable Conditions: T2DM: vsSSI MDD: restart prozac once able to tolerate PO Neuropathy: restart home gabapentin when able to tolerate PO  FEN/GI: Renal diet VTE Prophylaxis: SQ Hep  Disposition: Med Tele  History of Present Illness:  [name] [name] is a 53 y.o. male presenting with chest pain, vomiting, abdominal pain and HTN. Reports ~3 days of vomiting(~5 times a day), subjective fever, burning throat, cough, lightheadedness, generalized weakness. Thinks there may have been a small amount of blood in vomit a couple times yesterday but non today (described as red spots in vomit). Intermittent RLQ abm pain started yesterday. Unable to eat yesterday but was able to  keep lunch down today. Has been sipping on water and soda. No diarrhea. No SOB. No known sick contacts.   Then today, pt went to HD and finished his HD session. After the session, he started have chest pain. Chest pain described as central/left chest, tight feeling, exacerbated with coughing. Pt was then  sent to ED.  Per chart review Seen 2/24 by PCP for facial swelling and pt reported home BP readings of 170-180/100s, which improved after HD.   Recently admitted 1/7 for AMS, thought to be from hypotension and polypharmacy.   ED Course: On arrival, BP 200/104. Satting well on RA. Trop mildly elevated at 37. EKG showed no ST changes. CXR showed no active disease. CTAP was obtained due to abm tenderness and showed nonspecific colitis and body wall edema. Treated with hydralazine 10 IV x2, zofran, and phenergan. BP remains elevated.  Review Of Systems: Per HPI  Pertinent Past Medical History: ESRD on HD T2DM GERD HLD PAD History of necrotizing fasciitis Neuropathy MDD Chronic constipation Secondary hyperparathyroidism  Remainder reviewed in history tab.   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   Pertinent Social History: Tobacco use: smokes, declines nicotine patch  Alcohol use: No Other Substance use: No Lives with mom and sister  Pertinent Family History: Diabetes, MDD, colonic polyps, HTN, HLD, heart disease in first-degree relatives   Remainder reviewed in history tab.   Important Outpatient Medications: Atorvastatin 40 mg daily Tizanidine 2mg  daily prn Calcitriol 0.25 mcg every Tuesday, Thursday, and Saturday with HD Carvedilol 12.5 mg BID Diltiazem 300 mg daily Fluoxetine 20 mg daily Gabapentin 300 mg daily Humalog 0 to 6 units SSI TID with meals Hydralazine 50 mg BID Ondansetron 8 mg PRN Oxycodone 10 mg PRN MiraLAX 34 g TID Senna 8.6 mg BID Sevelamer Carbonate 800 mg 2 tabs TID with meals Tamsulosin 0.4 mg daily Vitamin D 1.25 mg (50,000 units) weekly  Remainder reviewed in medication history.   Objective: BP (!) 216/111   Pulse 89   Temp 99 F (37.2 C) (Oral)   Resp 16   SpO2 90%  Exam: General: Alert, uncomfortable  appearing man able to speak appropriately on RA in full sentences. Occasionally having to vomit and gag. Neuro:  CN2: no vision changes CN3,4,6: PERRLA. EOMI CN5: Sensation intact BL CN7: Facial expressions symmetric CN8: Hearing intact BL CN9: palate symmetric  CN10: regular speech CN11: turns head against resistance CN12: tongue midline  HEENT: Dry mucus membranes.  Cardiovascular: RRR. No murmurs. Cap refill <2.  Respiratory: CTAB. Normal WOB on RA. Gastrointestinal: TTP in RLQ. No rebound, guarding, or rigidity. Normal BS.  Ext: No pitting edema in L LE.    Labs:  CBC BMET  Recent Labs  Lab 01/22/24 2003  WBC 8.5  HGB 12.1*  HCT 39.0  PLT 120*   Recent Labs  Lab 01/22/24 2003  NA 139  K 3.7  CL 96*  CO2 29  BUN 13  CREATININE 4.03*  GLUCOSE 173*  CALCIUM 8.8*      EKG: My own interpretation  - NSR. No ST changes   Imaging Studies Performed:  CXR - No active disease. Cardiomegaly   CTAP 1. Nonspecific colitis of the ascending, transverse and descending colon. 2. Cholelithiasis without evidence of acute cholecystitis. 3. Body wall edema. 4. Aortic Atherosclerosis (ICD10-I70.0).  Lincoln Brigham, MD 01/22/2024, 11:07 PM PGY-2, [facility]  FPTS Intern pager: [phone], text pages welcome Secure chat group [facility] Teaching Service

## 2024-01-23 ENCOUNTER — Other Ambulatory Visit: Payer: Self-pay

## 2024-01-23 ENCOUNTER — Encounter (HOSPITAL_COMMUNITY): Payer: Self-pay | Admitting: Family Medicine

## 2024-01-23 DIAGNOSIS — R9431 Abnormal electrocardiogram [ECG] [EKG]: Secondary | ICD-10-CM | POA: Diagnosis present

## 2024-01-23 DIAGNOSIS — E1151 Type 2 diabetes mellitus with diabetic peripheral angiopathy without gangrene: Secondary | ICD-10-CM | POA: Diagnosis not present

## 2024-01-23 DIAGNOSIS — E860 Dehydration: Secondary | ICD-10-CM | POA: Diagnosis present

## 2024-01-23 DIAGNOSIS — I169 Hypertensive crisis, unspecified: Secondary | ICD-10-CM | POA: Diagnosis not present

## 2024-01-23 DIAGNOSIS — D696 Thrombocytopenia, unspecified: Secondary | ICD-10-CM | POA: Diagnosis not present

## 2024-01-23 DIAGNOSIS — Z1152 Encounter for screening for COVID-19: Secondary | ICD-10-CM | POA: Diagnosis not present

## 2024-01-23 DIAGNOSIS — N186 End stage renal disease: Secondary | ICD-10-CM | POA: Diagnosis not present

## 2024-01-23 DIAGNOSIS — I120 Hypertensive chronic kidney disease with stage 5 chronic kidney disease or end stage renal disease: Secondary | ICD-10-CM | POA: Diagnosis not present

## 2024-01-23 DIAGNOSIS — R066 Hiccough: Secondary | ICD-10-CM

## 2024-01-23 DIAGNOSIS — R112 Nausea with vomiting, unspecified: Secondary | ICD-10-CM | POA: Diagnosis not present

## 2024-01-23 DIAGNOSIS — K529 Noninfective gastroenteritis and colitis, unspecified: Secondary | ICD-10-CM | POA: Diagnosis not present

## 2024-01-23 DIAGNOSIS — Z860101 Personal history of adenomatous and serrated colon polyps: Secondary | ICD-10-CM | POA: Diagnosis not present

## 2024-01-23 DIAGNOSIS — N2581 Secondary hyperparathyroidism of renal origin: Secondary | ICD-10-CM | POA: Diagnosis not present

## 2024-01-23 DIAGNOSIS — R109 Unspecified abdominal pain: Secondary | ICD-10-CM | POA: Diagnosis not present

## 2024-01-23 DIAGNOSIS — E1140 Type 2 diabetes mellitus with diabetic neuropathy, unspecified: Secondary | ICD-10-CM | POA: Diagnosis not present

## 2024-01-23 DIAGNOSIS — K8020 Calculus of gallbladder without cholecystitis without obstruction: Secondary | ICD-10-CM

## 2024-01-23 DIAGNOSIS — Z560 Unemployment, unspecified: Secondary | ICD-10-CM | POA: Diagnosis not present

## 2024-01-23 DIAGNOSIS — F329 Major depressive disorder, single episode, unspecified: Secondary | ICD-10-CM | POA: Diagnosis present

## 2024-01-23 DIAGNOSIS — D631 Anemia in chronic kidney disease: Secondary | ICD-10-CM | POA: Diagnosis not present

## 2024-01-23 DIAGNOSIS — I160 Hypertensive urgency: Secondary | ICD-10-CM | POA: Diagnosis not present

## 2024-01-23 DIAGNOSIS — Z833 Family history of diabetes mellitus: Secondary | ICD-10-CM | POA: Diagnosis not present

## 2024-01-23 DIAGNOSIS — E1122 Type 2 diabetes mellitus with diabetic chronic kidney disease: Secondary | ICD-10-CM | POA: Diagnosis not present

## 2024-01-23 DIAGNOSIS — R933 Abnormal findings on diagnostic imaging of other parts of digestive tract: Secondary | ICD-10-CM | POA: Diagnosis not present

## 2024-01-23 DIAGNOSIS — R1110 Vomiting, unspecified: Secondary | ICD-10-CM | POA: Diagnosis not present

## 2024-01-23 DIAGNOSIS — I10 Essential (primary) hypertension: Secondary | ICD-10-CM | POA: Diagnosis not present

## 2024-01-23 DIAGNOSIS — F17210 Nicotine dependence, cigarettes, uncomplicated: Secondary | ICD-10-CM | POA: Diagnosis not present

## 2024-01-23 DIAGNOSIS — K8689 Other specified diseases of pancreas: Secondary | ICD-10-CM | POA: Diagnosis not present

## 2024-01-23 DIAGNOSIS — K869 Disease of pancreas, unspecified: Secondary | ICD-10-CM | POA: Diagnosis present

## 2024-01-23 DIAGNOSIS — Z8249 Family history of ischemic heart disease and other diseases of the circulatory system: Secondary | ICD-10-CM | POA: Diagnosis not present

## 2024-01-23 DIAGNOSIS — N185 Chronic kidney disease, stage 5: Secondary | ICD-10-CM | POA: Diagnosis not present

## 2024-01-23 DIAGNOSIS — Z992 Dependence on renal dialysis: Secondary | ICD-10-CM | POA: Diagnosis not present

## 2024-01-23 DIAGNOSIS — Z89511 Acquired absence of right leg below knee: Secondary | ICD-10-CM | POA: Diagnosis not present

## 2024-01-23 DIAGNOSIS — E785 Hyperlipidemia, unspecified: Secondary | ICD-10-CM | POA: Diagnosis present

## 2024-01-23 DIAGNOSIS — Z794 Long term (current) use of insulin: Secondary | ICD-10-CM | POA: Diagnosis not present

## 2024-01-23 DIAGNOSIS — I959 Hypotension, unspecified: Secondary | ICD-10-CM | POA: Diagnosis not present

## 2024-01-23 DIAGNOSIS — Z79899 Other long term (current) drug therapy: Secondary | ICD-10-CM | POA: Diagnosis not present

## 2024-01-23 LAB — GLUCOSE, CAPILLARY
Glucose-Capillary: 171 mg/dL — ABNORMAL HIGH (ref 70–99)
Glucose-Capillary: 128 mg/dL — ABNORMAL HIGH (ref 70–99)
Glucose-Capillary: 204 mg/dL — ABNORMAL HIGH (ref 70–99)

## 2024-01-23 LAB — COMPREHENSIVE METABOLIC PANEL
ALT: 9 U/L (ref 0–44)
AST: 13 U/L — ABNORMAL LOW (ref 15–41)
Albumin: 2.6 g/dL — ABNORMAL LOW (ref 3.5–5.0)
Alkaline Phosphatase: 81 U/L (ref 38–126)
Anion gap: 12 (ref 5–15)
BUN: 17 mg/dL (ref 6–20)
CO2: 30 mmol/L (ref 22–32)
Calcium: 8.1 mg/dL — ABNORMAL LOW (ref 8.9–10.3)
Chloride: 97 mmol/L — ABNORMAL LOW (ref 98–111)
Creatinine, Ser: 4.9 mg/dL — ABNORMAL HIGH (ref 0.61–1.24)
GFR, Estimated: 14 mL/min — ABNORMAL LOW (ref >60)
Glucose, Bld: 200 mg/dL — ABNORMAL HIGH (ref 70–99)
Potassium: 3.4 mmol/L — ABNORMAL LOW (ref 3.5–5.1)
Sodium: 139 mmol/L (ref 135–145)
Total Bilirubin: 0.5 mg/dL (ref 0.0–1.2)
Total Protein: 5.4 g/dL — ABNORMAL LOW (ref 6.5–8.1)

## 2024-01-23 LAB — CBG MONITORING, ED: Glucose-Capillary: 161 mg/dL — ABNORMAL HIGH (ref 70–99)

## 2024-01-23 LAB — RESPIRATORY PANEL BY PCR

## 2024-01-23 LAB — CBC
HCT: 37.1 % — ABNORMAL LOW (ref 39.0–52.0)
Hemoglobin: 11.4 g/dL — ABNORMAL LOW (ref 13.0–17.0)
MCH: 26.5 pg (ref 26.0–34.0)
MCHC: 30.7 g/dL (ref 30.0–36.0)
MCV: 86.1 fL (ref 80.0–100.0)
Platelets: 119 10*3/uL — ABNORMAL LOW (ref 150–400)
RBC: 4.31 MIL/uL (ref 4.22–5.81)
RDW: 19 % — ABNORMAL HIGH (ref 11.5–15.5)
WBC: 9 10*3/uL (ref 4.0–10.5)
nRBC: 0 % (ref 0.0–0.2)

## 2024-01-23 LAB — I-STAT CG4 LACTIC ACID, ED: Lactic Acid, Venous: 0.5 mmol/L (ref 0.5–1.9)

## 2024-01-23 LAB — LIPASE, BLOOD: Lipase: 34 U/L (ref 11–51)

## 2024-01-23 LAB — TSH: TSH: 2.131 u[IU]/mL (ref 0.350–4.500)

## 2024-01-23 LAB — SARS CORONAVIRUS 2 BY RT PCR: SARS Coronavirus 2 by RT PCR: NEGATIVE

## 2024-01-23 MED ORDER — HYDRALAZINE HCL 20 MG/ML IJ SOLN
20.0000 mg | Freq: Four times a day (QID) | INTRAMUSCULAR | Status: DC
Start: 2024-01-23 — End: 2024-01-24
  Administered 2024-01-23 – 2024-01-24 (×3): 20 mg via INTRAVENOUS
  Filled 2024-01-23 (×5): qty 1

## 2024-01-23 MED ORDER — METOPROLOL TARTRATE 5 MG/5ML IV SOLN
10.0000 mg | Freq: Once | INTRAVENOUS | Status: AC
  Administered 2024-01-23: 10 mg via INTRAVENOUS
  Filled 2024-01-23: qty 10

## 2024-01-23 MED ORDER — FERUMOXYTOL INJECTION 510 MG/17 ML: 510.0000 mg | INTRAVENOUS | Status: DC

## 2024-01-23 MED ORDER — METOCLOPRAMIDE HCL 5 MG/ML IJ SOLN
10.0000 mg | Freq: Three times a day (TID) | INTRAMUSCULAR | Status: DC
  Administered 2024-01-23 (×2): 10 mg via INTRAVENOUS
  Filled 2024-01-23 (×3): qty 2

## 2024-01-23 MED ORDER — ONDANSETRON HCL 4 MG/2ML IJ SOLN
4.0000 mg | Freq: Four times a day (QID) | INTRAMUSCULAR | Status: DC
  Administered 2024-01-23 – 2024-01-24 (×3): 4 mg via INTRAVENOUS
  Filled 2024-01-23 (×3): qty 2

## 2024-01-23 MED ORDER — PROCHLORPERAZINE EDISYLATE 10 MG/2ML IJ SOLN
5.0000 mg | Freq: Once | INTRAMUSCULAR | Status: AC
  Administered 2024-01-23: 5 mg via INTRAVENOUS
  Filled 2024-01-23: qty 2

## 2024-01-23 MED ORDER — SODIUM CHLORIDE 0.9 % IV BOLUS
250.0000 mL | Freq: Once | INTRAVENOUS | Status: AC
Start: 2024-01-23 — End: 2024-01-23
  Administered 2024-01-23: 250 mL via INTRAVENOUS

## 2024-01-23 MED ORDER — METOPROLOL TARTRATE 5 MG/5ML IV SOLN
5.0000 mg | Freq: Two times a day (BID) | INTRAVENOUS | Status: DC | PRN
  Administered 2024-01-23: 5 mg via INTRAVENOUS
  Filled 2024-01-23: qty 5

## 2024-01-23 MED ORDER — HYDRALAZINE HCL 20 MG/ML IJ SOLN
20.0000 mg | Freq: Once | INTRAMUSCULAR | Status: AC
  Administered 2024-01-23: 20 mg via INTRAVENOUS
  Filled 2024-01-23: qty 1

## 2024-01-23 MED ORDER — CHLORHEXIDINE GLUCONATE CLOTH 2 % EX PADS
6.0000 | MEDICATED_PAD | Freq: Every day | CUTANEOUS | Status: DC
  Administered 2024-01-23 – 2024-01-24 (×2): 6 via TOPICAL

## 2024-01-23 MED ORDER — SODIUM CHLORIDE 0.9% FLUSH
10.0000 mL | Freq: Two times a day (BID) | INTRAVENOUS | Status: DC
Start: 2024-01-23 — End: 2024-01-26
  Administered 2024-01-23 – 2024-01-24 (×4): 10 mL

## 2024-01-23 MED ORDER — NEPRO/CARBSTEADY PO LIQD
237.0000 mL | Freq: Two times a day (BID) | ORAL | Status: DC
  Administered 2024-01-23 (×2): 237 mL via ORAL

## 2024-01-23 MED ORDER — SODIUM CHLORIDE 0.9% FLUSH: 10.0000 mL | INTRAVENOUS | Status: DC | PRN

## 2024-01-23 MED ORDER — PANTOPRAZOLE SODIUM 40 MG IV SOLR
40.0000 mg | INTRAVENOUS | Status: DC
  Administered 2024-01-23: 40 mg via INTRAVENOUS
  Filled 2024-01-23: qty 10

## 2024-01-23 MED ORDER — DILTIAZEM HCL ER COATED BEADS 180 MG PO CP24
300.0000 mg | ORAL_CAPSULE | Freq: Every day | ORAL | Status: DC
  Administered 2024-01-23 – 2024-01-26 (×4): 300 mg via ORAL
  Filled 2024-01-23 (×4): qty 1

## 2024-01-23 NOTE — Assessment & Plan Note (Deleted)
 CTAP showed nonspecific colitis, which could be from viral gastroenteritis. Appears that he has chronic abm pain that has had benign workup in the past.  - CTM

## 2024-01-23 NOTE — Assessment & Plan Note (Addendum)
 ACS workup unremarkable with flat troponin and NSR on last EKG. Suspect possible MSK vs esophageal irritation from frequent emesis.  Considered Mallory-Weiss but less likely Boerhaave's syndrome given checks x-ray was unremarkable. - Tylenol prn for pain -Will consider PPI - AM CBC, CMP

## 2024-01-23 NOTE — Assessment & Plan Note (Addendum)
 HD usually TTS. Last HD was 3/1. - Will consult Nephro for HD

## 2024-01-23 NOTE — ED Notes (Addendum)
 Paged FMTS resident as instructed regarding patient update, waiting for reply.

## 2024-01-23 NOTE — Assessment & Plan Note (Addendum)
 This appears to be an intermittent problem for patient given recent admission with N/V  and abdominal pain from few weeks ago. CTAP show colitis of the ascending, transverse and descending colon.  Compared to prior imaging which showed Ascending at the time. 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  Given persistent presentation and image findings will consult GI. Also suspect gastroparesis given patient's history of type 2 diabetes. -Consult GI, appreciate recs -As needed Reglan with close monitoring of QTc -Advance diet as tolerated - CTM

## 2024-01-23 NOTE — ED Notes (Addendum)
 RN spoke to hospitalist  Jerre Simon MD over phone, pt should be safe to transport to floor with current BP.

## 2024-01-23 NOTE — Plan of Care (Signed)
 FMTS Brief Progress Note  S:Went to bedside earlier this am to check on pt and review tele given continued high BPs despite metop and hydral IV and intermittent tachycardia overnight, mostly associated with emesis.  This morning he continues to deny SOB, changes in vision. CP only with hiccups vomiting and cough. Abdominal pain is mild.    O: BP (!) 221/125   Pulse 80   Temp 98.7 F (37.1 C) (Oral)   Resp 17   SpO2 100%   Lying in bed, unwell appearing but NAD NSR on tele w/ normal rate currently Breathing comfortably on RA Alert with no focal deficits  Bowel sounds present, soft, mild diffuse TTP  Reviewed telemetry, had 2 episodes of V. tach overnight which resolved and multiple episodes of sinus tachycardia  A/P: HTN and emesis BPs still severely elevated but reassuringly no evidence of endorgan damage.  Self resolving episodes of V. tach in setting of acute illness and HTN -One-time dose hydralazine 20 mg this morning.  If continues to be elevated, can give metoprolol 10 mg IV -Restart home meds when can tolerate p.o. -Day team will see soon to assess need for augmentation of antiemetics  Erick Alley, DO 01/23/2024, 6:34 AM PGY-3, [facility] Night Resident  Please page [phone] with questions.

## 2024-01-23 NOTE — Consult Note (Addendum)
 Consultation  Referring Provider: Family medicine service [name] Primary Care Physician:  [name], MD Primary Gastroenterologist:  Dr.Danis  Reason for Consultation: Nausea and vomiting, abnormal CT suggestive of colitis  HPI: [name] [name] is a 53 y.o. male with multiple significant comorbidities, known to Dr. Myrtie Neither. He has history of diabetes mellitus, hypertension, end-stage renal disease on dialysis, peripheral vascular disease status post right BKA hyperlipidemia, secondary hyperparathyroidism and chronic neuropathy. He was admitted through the emergency room yesterday immediately after dialysis.  He says he did not feel well at all during dialysis and was found to have a systolic blood pressure over 200 at the end of dialysis and therefore advised to come to the emergency room. His family member says that he has been having intermittent severe hypertension at home recently. He also has been having nausea and vomiting over the past couple of days with about 6 episodes of vomiting on Friday and Saturday.  He is complaining of heartburn and indigestion type symptoms.  No hematemesis.  He has no complaints of abdominal pain and has not had any diarrhea.  No documented fever or chill, denies headaches.  No sore throat etc. He says over the past couple of years he has had intermittent episodes of nausea and vomiting that usually occur at least once a week may have a couple of episodes and then is okay in between.  He does have some intermittent early satiety symptoms.  No dysphagia or odynophagia. He has had several prior EGDs the last in March 2024 done for anemia found to have a 2 cm hiatal hernia and probable candidiasis, otherwise negative exam. He also has history of adenomatous colon polyps, last colonoscopy 2021 per Dr. Myrtie Neither with multiple diverticuli he had 3 diminutive polyps removed which were tubular adenomas and indicated for 3-year interval follow-up.  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  There was also noted some fat stranding in the ascending colon transverse and descending colon question nonspecific colitis.  resp panel negative, COVID-negative Troponin mildly elevated at 36 and 37 On admit lipase within normal limits WBC 8.5/hemoglobin 12.1/Mehta crit 39/platelets 120 BUN 13/creatinine 4.3 LFTs within normal limits. Today WBC 9.0/hemoglobin 11.4/potassium 3.4/BUN 17/creatinine 4.9  His blood pressure is still severely elevated with systolic of 210/107 diastolic when I was in his room, he is also tachycardic with pulse about 116  Complaining of some intermittent burning chest pain, also says that his heart has been racing over the past few days  Not on a regular PPI at home, does have orally disintegrating Zofran which he uses as needed but does not find that helpful.  Previous notes Dr. Myrtie Neither has felt that he has visceral neuropathy, he has not had documented gastroparesis but certainly may have intermittent periods of time with gastric dysmotility contributing to nausea and vomiting. No cannabis use. No recent new medications etc.   Past Medical History:  Diagnosis Date   Abdominal pain 11/30/2023   Abdominal tenderness 10/14/2023   Allergy    seasonal and environmental   Anemia    ARF (acute renal failure) (HCC) 09/2017   Chest pain 12/01/2023   Chronic constipation 05/13/2020   CKD (chronic kidney disease) stage 4, GFR 15-29 ml/min (HCC)    Depression    Diabetes mellitus without complication (HCC) 2019   GERD (gastroesophageal  reflux disease)    Hyperlipidemia    Hypertension    Necrotizing fasciitis (HCC) 10/13/2017   Neuromuscular disorder (HCC)    neuropathy   PAD (peripheral artery disease) (HCC)    Secondary hyperparathyroidism (HCC)     Wound dehiscence 11/24/2017    Past Surgical History:  Procedure Laterality Date   AMPUTATION Right 10/13/2017   Procedure: RIGHT BELOW KNEE AMPUTATION;  Surgeon: Nadara Mustard, MD;  Location: [facility] OR;  Service: Orthopedics;  Laterality: Right;   AMPUTATION Right 11/24/2017   Procedure: AMPUTATION BELOW KNEE REVISION;  Surgeon: Nadara Mustard, MD;  Location[facility] OR;  Service: Orthopedics;  Laterality: Right;   AMPUTATION TOE Left    AV FISTULA PLACEMENT Left 10/19/2023   Procedure: LEFT BRACHIOCEPHALIC ARTERIOVENOUS (AV) FISTULA CREATION;  Surgeon: Daria Pastures, MD;  Location: [facility] OR;  Service: Vascular;  Laterality: Left;   BIOPSY  09/02/2021   Procedure: BIOPSY;  Surgeon: Imogene Burn, MD;  Location: [facility] ENDOSCOPY;  Service: Gastroenterology;;   BIOPSY  02/18/2023   Procedure: BIOPSY;  Surgeon: Benancio Deeds, MD;  Location: [facility] ENDOSCOPY;  Service: Gastroenterology;;   COLONOSCOPY  05/11/2019   ESOPHAGOGASTRODUODENOSCOPY (EGD) WITH PROPOFOL N/A 09/02/2021   Procedure: ESOPHAGOGASTRODUODENOSCOPY (EGD) WITH PROPOFOL;  Surgeon: Imogene Burn, MD;  Location: [facility] ENDOSCOPY;  Service: Gastroenterology;  Laterality: N/A;   ESOPHAGOGASTRODUODENOSCOPY (EGD) WITH PROPOFOL N/A 02/18/2023   Procedure: ESOPHAGOGASTRODUODENOSCOPY (EGD) WITH PROPOFOL;  Surgeon: Benancio Deeds, MD;  Location: [facility] ENDOSCOPY;  Service: Gastroenterology;  Laterality: N/A;   INTRAMEDULLARY (IM) NAIL INTERTROCHANTERIC Right 10/15/2023   Procedure: INTRAMEDULLARY (IM) NAIL INTERTROCHANTERIC;  Surgeon: Roby Lofts, MD;  Location: [facility] OR;  Service: Orthopedics;  Laterality: Right;   IR FLUORO GUIDE CV LINE RIGHT  10/15/2023   IR US GUIDE VASC ACCESS RIGHT  10/15/2023   NO PAST SURGERIES     POLYPECTOMY     WISDOM TOOTH EXTRACTION      Prior to Admission medications   Medication Sig Start Date End Date Taking? Authorizing Provider  acetaminophen (TYLENOL) 500 MG tablet Take 2 tablets (1,000 mg total) by  mouth every 6 (six) hours as needed. Patient taking differently: Take 1,000 mg by mouth every 6 (six) hours as needed for mild pain (pain score 1-3). 10/26/23  Yes Baloch, Mahnoor, MD  atorvastatin (LIPITOR) 40 MG tablet Take 1 tablet (40 mg total) by mouth at bedtime. Patient taking differently: Take 40 mg by mouth daily. 01/17/24  Yes [name], MD  calcitRIOL (ROCALTROL) 0.25 MCG capsule Take 1 capsule (0.25 mcg total) by mouth Every Tuesday,Thursday,and Saturday with dialysis. 10/26/23  Yes Baloch, Mahnoor, MD  carvedilol (COREG) 12.5 MG tablet Take 12.5 mg by mouth 2 (two) times daily with a meal.   Yes [provider]  Darbepoetin Alfa (ARANESP) 150 MCG/0.3ML SOSY injection Inject 0.3 mLs (150 mcg total) into the skin every Saturday at 6 PM. Patient taking differently: Inject 150 mcg into the skin every Saturday at 6 PM. At dialysis 10/30/23  Yes Baloch, Mahnoor, MD  diclofenac Sodium (VOLTAREN) 1 % GEL Apply 4 g topically 4 (four) times daily. Patient taking differently: Apply 4 g topically 4 (four) times daily as needed (pain). 11/29/23  Yes [name], MD  diltiazem (CARDIZEM CD) 300 MG 24 hr capsule TAKE 1 CAPSULE(300 MG) BY MOUTH DAILY 12/31/23  Yes [name], MD  FLUoxetine (PROZAC) 20 MG capsule Take 1 capsule (20 mg total) by mouth daily. 12/30/23  Yes [name], MD  gabapentin (  NEURONTIN) 300 MG capsule Take 1 capsule (300 mg total) by mouth daily. 12/30/23  Yes [name], MD  hydrALAZINE (APRESOLINE) 50 MG tablet Take 1 tablet (50 mg total) by mouth 2 (two) times daily. Patient taking differently: Take 100 mg by mouth 2 (two) times daily. 10/26/23  Yes Baloch, Mahnoor, MD  insulin lispro (HUMALOG) 100 UNIT/ML injection 0-6 Units, Subcutaneous, 3 times daily with meals, First dose on Sun 10/17/23 at 0800 Correction coverage: Very Sensitive (ESRD/Dialysis) CBG < 70: Implement Hypoglycemia Standing Orders and refer to Hypoglycemia Standing Orders sidebar  report CBG 70 - 120: 0 units CBG 121 - 150: 0 units CBG 151 - 200: 1 unit CBG 201-250: 2 units CBG 251-300: 3 units CBG 301-350: 4 units CBG 351-400: 5 units CBG > 400: Give 6 units and call MD Patient taking differently: 0-6 Units, Subcutaneous, 3 times daily with meals, as needed  Correction coverage: Very Sensitive (ESRD/Dialysis) CBG < 70: Implement Hypoglycemia Standing Orders and refer to Hypoglycemia Standing Orders sidebar report CBG 70 - 120: 0 units CBG 121 - 150: 0 units CBG 151 - 200: 1 unit CBG 201-250: 2 units CBG 251-300: 3 units CBG 301-350: 4 units CBG 351-400: 5 units CBG > 400: Give 6 units and call MD 10/26/23  Yes Baloch, Mahnoor, MD  lidocaine (HM LIDOCAINE PATCH) 4 % Place 1 patch onto the skin daily. Patient taking differently: Place 1 patch onto the skin daily as needed (pain). 11/29/23  Yes [name], MD  meloxicam (MOBIC) 15 MG tablet Take 15 mg by mouth daily.   Yes [provider]  metoprolol succinate (TOPROL-XL) 25 MG 24 hr tablet Take 25 mg by mouth daily. 01/13/24  Yes [provider]  ondansetron (ZOFRAN-ODT) 4 MG disintegrating tablet Take 1 tablet (4 mg total) by mouth every 8 (eight) hours as needed for nausea or vomiting. 12/12/23  Yes [name], MD  oxyCODONE-acetaminophen (PERCOCET) 10-325 MG tablet Take 1 tablet by mouth every 8 (eight) hours as needed for pain. 01/13/24  Yes [provider]  sevelamer carbonate (RENVELA) 800 MG tablet Take 2 tablets (1,600 mg total) by mouth 3 (three) times daily with meals. Patient taking differently: Take 1,600 mg by mouth 2 (two) times daily with a meal. 10/26/23  Yes Baloch, Mahnoor, MD  tamsulosin (FLOMAX) 0.4 MG CAPS capsule TAKE 1 CAPSULE(0.4 MG) BY MOUTH DAILY AFTER SUPPER Patient taking differently: Take 0.4 mg by mouth daily as needed (bladder control). 12/31/23  Yes [name], MD  tiZANidine (ZANAFLEX) 4 MG tablet TAKE 1/2 TABLET(2 MG) BY MOUTH DAILY AS NEEDED  FOR MUSCLE SPASMS Patient taking differently: Take 4 mg by mouth daily as needed for muscle spasms. 01/17/24  Yes [name], MD  Chad Cordial ULTRA-THIN LANCETS MISC Check blood sugars before meals twice daily 11/19/17   Julieanne Manson, MD  Blood Glucose Monitoring Suppl (ACCU-CHEK GUIDE ME) w/Device KIT See admin instructions. 02/23/23   [provider]  glucose blood (AGAMATRIX PRESTO TEST) test strip Check blood sugars twice daily before meals 02/02/18   Julieanne Manson, MD  glucose blood test strip 1 each by Other route as needed for other. Use as instructed    [provider]    Current Facility-Administered Medications  Medication Dose Route Frequency Provider Last Rate Last Admin   acetaminophen (TYLENOL) tablet 650 mg  650 mg Oral Q6H PRN Lincoln Brigham, MD       Chlorhexidine Gluconate Cloth 2 % PADS 6 each  6 each Topical Daily  Doreene Eland, MD   6 each at 01/23/24 1053   diltiazem (CARDIZEM CD) 24 hr capsule 300 mg  300 mg Oral Daily Jerre Simon, MD   300 mg at 01/23/24 1049   feeding supplement (NEPRO CARB STEADY) liquid 237 mL  237 mL Oral BID BM Janit Pagan T, MD   237 mL at 01/23/24 1052   heparin injection 5,000 Units  5,000 Units Subcutaneous Q8H Erick Alley, DO   5,000 Units at 01/23/24 1610   hydrALAZINE (APRESOLINE) injection 20 mg  20 mg Intravenous Q6H Jerre Simon, MD       insulin aspart (novoLOG) injection 0-6 Units  0-6 Units Subcutaneous TID WC Lincoln Brigham, MD   1 Units at 01/23/24 9604   metoprolol tartrate (LOPRESSOR) injection 5 mg  5 mg Intravenous Q12H PRN Jerre Simon, MD       ondansetron [facility]) 8 mg in sodium chloride 0.9 % 50 mL IVPB  8 mg Intravenous Q8H PRN Erick Alley, DO   Stopped at 01/23/24 5409   Or   ondansetron (ZOFRAN-ODT) disintegrating tablet 8 mg  8 mg Oral Q8H PRN Erick Alley, DO   8 mg at 01/23/24 1020   sodium chloride flush (NS) 0.9 % injection 10-40 mL  10-40 mL Intracatheter Q12H Janit Pagan T,  MD   10 mL at 01/23/24 1050   sodium chloride flush (NS) 0.9 % injection 10-40 mL  10-40 mL Intracatheter PRN Doreene Eland, MD        Allergies as of 01/22/2024 - Review Complete 01/22/2024  Allergen Reaction Noted   Iron Nausea And Vomiting 12/12/2021   Tape Rash 11/19/2017    Family History  Problem Relation Age of Onset   Diabetes Mellitus II Mother    Depression Mother    Colon polyps Mother    Hypertension Mother    High Cholesterol Mother    Heart disease Father        CABG x 4.  04/2017   Diabetes Father    High Cholesterol Father    Hypertension Father    Heart disease Maternal Grandmother    Heart disease Maternal Grandfather    Colon cancer Neg Hx    Esophageal cancer Neg Hx    Liver cancer Neg Hx    Pancreatic cancer Neg Hx    Rectal cancer Neg Hx    Stomach cancer Neg Hx     Social History   Socioeconomic History   Marital status: Divorced    Spouse name: Not on file   Number of children: 1   Years of education: 12   Highest education level: Associate degree: occupational, Scientist, product/process development, or vocational program  Occupational History   Occupation: unemployed  Tobacco Use   Smoking status: Every Day    Current packs/day: 0.50    Average packs/day: 0.5 packs/day for 35.7 years (17.8 ttl pk-yrs)    Types: Cigarettes    Start date: 11/23/1988   Smokeless tobacco: Never   Tobacco comments:    Uninterested - 0/10  importance of quitting 02/2023  Vaping Use   Vaping status: Never Used  Substance and Sexual Activity   Alcohol use: No   Drug use: No   Sexual activity: Not Currently  Other Topics Concern   Not on file  Social History Narrative   Originally from  [place].   Is living with his mother, who essentially supports him now.   Social Drivers of Health   Financial Resource Strain: High Risk (10/30/2023)  Overall Financial Resource Strain (CARDIA)    Difficulty of Paying Living Expenses: Very hard  Food Insecurity: Patient Declined (01/23/2024)    Hunger Vital Sign    Worried About Running Out of Food in the Last Year: Patient declined    Ran Out of Food in the Last Year: Patient declined  Recent Concern: Food Insecurity - Food Insecurity Present (12/01/2023)   Hunger Vital Sign    Worried About Running Out of Food in the Last Year: Never true    Ran Out of Food in the Last Year: Sometimes true  Transportation Needs: Patient Declined (01/23/2024)   PRAPARE - Administrator, Civil Service (Medical): Patient declined    Lack of Transportation (Non-Medical): Patient declined  Physical Activity: Unknown (10/30/2023)   Exercise Vital Sign    Days of Exercise per Week: Patient declined    Minutes of Exercise per Session: 0 min  Stress: Stress Concern Present (10/30/2023)   [employer] of Occupational Health - Occupational Stress Questionnaire    Feeling of Stress : To some extent  Social Connections: Patient Declined (01/23/2024)   Social Connection and Isolation Panel [NHANES]    Frequency of Communication with Friends and Family: Patient declined    Frequency of Social Gatherings with Friends and Family: Patient declined    Attends Religious Services: Patient declined    Database administrator or Organizations: Patient declined    Attends Banker Meetings: Patient declined    Marital Status: Patient declined  Recent Concern: Social Connections - Socially Isolated (01/23/2024)   Social Connection and Isolation Panel [NHANES]    Frequency of Communication with Friends and Family: More than three times a week    Frequency of Social Gatherings with Friends and Family: Never    Attends Religious Services: Never    Database administrator or Organizations: No    Attends Banker Meetings: Never    Marital Status: Never married  Intimate Partner Violence: Patient Declined (01/23/2024)   Humiliation, Afraid, Rape, and Kick questionnaire    Fear of Current or Ex-Partner: Patient declined    Emotionally  Abused: Patient declined    Physically Abused: Patient declined    Sexually Abused: Patient declined    Review of Systems: Pertinent positive and negative review of systems were noted in the above HPI section.  All other review of systems was otherwise negative.   Physical Exam: Vital signs in last 24 hours: Temp:  [98 F (36.7 C)-99 F (37.2 C)] 98 F (36.7 C) (03/02 1007) Pulse Rate:  [61-146] 78 (03/02 1007) Resp:  [10-26] 20 (03/02 1007) BP: (174-221)/(93-131) 205/107 (03/02 1007) SpO2:  [83 %-100 %] 98 % (03/02 1007) Last BM Date : 01/21/24 General:   Alert,  Well-developed, well-nourished, chronically ill-appearing male pleasant and cooperative in NAD Head:  Normocephalic and atraumatic. Eyes:  Sclera clear, no icterus.   Conjunctiva pink. Ears:  Normal auditory acuity. Nose:  No deformity, discharge,  or lesions. Mouth:  No deformity or lesions.   Neck:  Supple; no masses or thyromegaly. Lungs:   Clear throughout to auscultation.   No wheezes, crackles, or rhonchi. Heart: Tachy regular rate and rhythm; no murmurs, clicks, rubs,  or gallops. Abdomen:  Soft,nontender, BS active,nonpalp mass or hsm.   Rectal: Not done Msk:  Symmetrical without gross deformities. . Pulses:  Normal pulses noted. Extremities: Status post BKA. Neurologic:  Alert and  oriented x4;  grossly normal neurologically. Skin:  Intact without significant  lesions or rashes.. Psych:  Alert and cooperative. Normal mood and affect.  Intake/Output from previous day: 03/01 0701 - 03/02 0700 In: 51.9 [IV Piggyback:51.9] Out: -  Intake/Output this shift: No intake/output data recorded.  Lab Results: Recent Labs    01/22/24 2003 01/23/24 0516  WBC 8.5 9.0  HGB 12.1* 11.4*  HCT 39.0 37.1*  PLT 120* 119*   BMET Recent Labs    01/22/24 2003 01/23/24 0516  NA 139 139  K 3.7 3.4*  CL 96* 97*  CO2 29 30  GLUCOSE 173* 200*  BUN 13 17  CREATININE 4.03* 4.90*  CALCIUM 8.8* 8.1*   LFT Recent  Labs    01/23/24 0516  PROT 5.4*  ALBUMIN 2.6*  AST 13*  ALT 9  ALKPHOS 81  BILITOT 0.5   PT/INR No results for input(s): "LABPROT", "INR" in the last 72 hours. Hepatitis Panel No results for input(s): "HEPBSAG", "HCVAB", "HEPAIGM", "HEPBIGM" in the last 72 hours.  IMPRESSION:  #[age] year old white male with multiple serious comorbidities including end-stage renal disease on dialysis, insulin-dependent diabetes mellitus, history of neuropathy, hypertension, peripheral arterial disease status post right BKA who was admitted after dialysis yesterday complaints of coughing, nausea vomiting chest discomfort and severe hypertension with blood pressure 220/120  Blood pressure remains severely elevated today-consistent with hypertensive crisis  Hypertensive crisis certainly can cause nausea and vomiting, this patient has had intermittent nausea and vomiting for the past few years attributed to visceral neuropathy/gastric dysmotility in setting of insulin-dependent diabetes mellitus.  With 2-day history of multiple episodes of nausea and vomiting per day, no abdominal pain no diarrhea no fever to suggest infectious etiology though still consider.  CT scan shows cholelithiasis, he does not have any symptoms to suggest cholecystitis currently CT also suggested nonspecific colitis in the ascending and transverse colon and patient has not had any diarrhea or abdominal pain, this may be an overcall.  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  He will need follow-up MRI for the previously mentioned IPMN's of the pancreas, and can get MRI of the  abdomen at that time to further assess the colonic findings and mesenteric vasculature entheses versus going ahead with a CTA inpatient with creatinine of 4 on dialysis.  GI will follow along   Amy EsterwoodPA-C  01/23/2024, 11:46 AM   Attending physician's note  I have taken a history, reviewed the chart and examined the patient. I performed a substantive portion of this encounter, including complete performance of at least one of the key components, in conjunction with the APP. I agree with the APP's note, impression and recommendations.    53 year old male with multiple comorbidities, including peripheral vascular disease, diabetes, end-stage renal disease on hemodialysis status post right BKA with worsening of chronic intermittent nausea and vomiting, CT suggestive of colitis. Noted significant fluctuation in systolic blood pressure, findings of colitis could be secondary to ischemic colitis  Will consider CTA or MRA for further evaluation Continue symptomatic management and supportive care He has had multiple endoscopic evaluation in the past, will hold off for now  GI will continue to follow   The patient was provided an opportunity  to ask questions and all were answered. The patient agreed with the plan and demonstrated an understanding of the instructions.  Iona Beard , MD [phone]

## 2024-01-23 NOTE — Progress Notes (Signed)
   01/23/24 1007  Vitals  Temp 98 F (36.7 C)  Temp Source Oral  BP (!) 205/107  MAP (mmHg) 134  BP Location Right Arm  BP Method Automatic  Patient Position (if appropriate) Lying  Pulse Rate 78  Pulse Rate Source Dinamap  Resp 20  Level of Consciousness  Level of Consciousness Alert  MEWS COLOR  MEWS Score Color Yellow  Oxygen Therapy  SpO2 98 %  O2 Device Room Air  MEWS Score  MEWS Temp 0  MEWS Systolic 2  MEWS Pulse 0  MEWS RR 0  MEWS LOC 0  MEWS Score 2   Patient arrived to 53M. Nausea and vomiting. Tele reported pulse increase to 155 intermittently. Nothing on MAR to give for BP. Hospitalist notified. Admin Zofran.

## 2024-01-23 NOTE — Assessment & Plan Note (Addendum)
 Home medication includes metoprolol 25 mg daily, Coreg 12.5 mg twice daily, Hydralazine  and diltiazem. Still poorly controlled and has been on IV hydralazine.  Suspect severely elevated most likely due to patient not being able to keep meds down for the last 2-3 days. - IV metoprolol 5mg  bid PRN - IV hydralazine 20 mg q6h - Restart home Diltiazem -Monitor with routine vitals.

## 2024-01-23 NOTE — Plan of Care (Signed)
 FMTS Brief Progress Note  S:Evaluated at bedside with Dr. Velna Ochs. No complaints at this time. States his nausea comes and goes, states he last vomited this morning.    O: BP (!) 188/93 (BP Location: Right Arm)   Pulse 77   Temp 98.4 F (36.9 C) (Oral)   Resp 18   Ht 6\' 5"  (1.956 m)   Wt 97 kg   SpO2 96%   BMI 25.36 kg/m   Gen: no acute distress, initially sleeping, resting comfortably CV: RRR, no m/r/g Abd: soft, diffusely tender to palpation, non-distended  A/P: Intractable vomiting/abdominal pain - stable at this time, unclear etiology - GI following, appreciate recs - Reglan 10mg  q8h, Zofran 4mg  q6h - caution with slightly prolonged QT - will repeat EKG in AM  Acute severe HTN - likely due to intractable vomiting and inability to tolerate home BP regimen - home BP regimen transitioned to IV dosing, will switch back to PO when able to tolerate - IV metop PRN for BP >180/100  Rest of plan per day team  - Orders reviewed. Labs for AM ordered, which was adjusted as needed.   [name] Dahlstrom, DO 01/23/2024, 9:00 PM PGY-1, [facility] Night Resident  Please page [phone] with questions.

## 2024-01-23 NOTE — Progress Notes (Addendum)
 Daily Progress Note Intern Pager: [phone]  Patient name: [name] [name] Medical record number: [id number] Date of birth: [birth year] Age: 53 y.o. Gender: male  Primary Care Provider: [name], MD Consultants: GI Code Status: Full  Pt Overview and Major Events to Date:  3/1-admitted 3/2- GI consulted   Assessment and Plan: [name] [name] is a 53 year old male history of ESRD on HD, T2DM, PAD, GERD, HLD, MDD admitted for chest pain and controlled hypertension.  Assessment & Plan Chest pain ACS workup unremarkable with flat troponin and NSR on last EKG. Suspect possible MSK vs esophageal irritation from frequent emesis.  Considered Mallory-Weiss but less likely Boerhaave's syndrome given checks x-ray was unremarkable. - Tylenol prn for pain -Will consider PPI - AM CBC, CMP Intractable vomiting This appears to be an intermittent problem for patient given recent admission with N/V  and abdominal pain from few weeks ago. CTAP show colitis of the ascending, transverse and descending colon.  Compared to prior imaging which showed Ascending at the time. 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  Given persistent presentation and image findings will consult GI. Also suspect gastroparesis given patient's history of type 2 diabetes. -Consult GI, appreciate recs -As needed Reglan with close monitoring of QTc -Advance diet as tolerated - CTM Uncontrolled hypertension Home medication includes metoprolol 25 mg daily, Coreg 12.5 mg twice daily, Hydralazine  and diltiazem. Still poorly controlled and has been on IV hydralazine.  Suspect severely elevated most likely due to patient not being able to keep meds down for the last 2-3 days. - IV metoprolol 5mg  bid PRN - IV hydralazine 20 mg q6h - Restart home Diltiazem -Monitor with routine vitals.  ESRD (end stage renal disease) on dialysis (HCC) HD usually TTS. Last HD  was 3/1. - Will consult Nephro for HD   Elevated Qtc Currently on PRN antiemetics. -12 lead EKG ordered  Chronic and Stable Problems:  T2DM: vsSSI MDD: restart prozac once able to tolerate PO Neuropathy: restart home gabapentin when able to tolerate PO   FEN/GI: Renal diet as tolerated PPx: Subcu heparin Dispo: pending clinical improvement .   Subjective:  [name] [name] is still having nausea and vomiting.  No significant improvement since admission with 6 emesis so far since admission. Still have mild abdominal pain   Objective: Temp:  [98.7 F (37.1 C)-99 F (37.2 C)] 98.7 F (37.1 C) (03/02 0551) Pulse Rate:  [61-124] 86 (03/02 0645) Resp:  [10-26] 18 (03/02 0645) BP: (174-221)/(93-125) 183/99 (03/02 0645) SpO2:  [90 %-100 %] 96 % (03/02 0645) Physical Exam: General: Alert, generally fatigue, Non toxic appearing Cardiovascular: RRR, no murmurs. Respiratory: TAV, normal WOB on RA Abdomen: Moderate epigastric tenderness, no distention, no rebound or guarding. +Bowel sound Extremities: RLE prosthetics  Neuro: AO x 3, no focal neurodeficits  Laboratory: Most recent CBC Lab Results  Component Value Date   WBC 9.0 01/23/2024   HGB 11.4 (L) 01/23/2024   HCT 37.1 (L) 01/23/2024   MCV 86.1 01/23/2024   PLT 119 (L) 01/23/2024   Most recent BMP    Latest Ref Rng & Units 01/23/2024    5:16 AM  BMP  Glucose 70 - 99 mg/dL 147   BUN 6 - 20 mg/dL 17   Creatinine 8.29 - 1.24 mg/dL 5.62   Sodium 130 - 865 mmol/L 139   Potassium 3.5 - 5.1 mmol/L 3.4   Chloride 98 - 111 mmol/L 97  CO2 22 - 32 mmol/L 30   Calcium 8.9 - 10.3 mg/dL 8.1      Imaging/Diagnostic Tests: CT ABDOMEN PELVIS WO CONTRAST Result Date: 01/22/2024 IMPRESSION:  1. Nonspecific colitis of the ascending, transverse and descending colon.  2. Cholelithiasis without acute cholecystitis.  3. Body wall edema.  4. Aortic Atherosclerosis   DG Chest Port 1 View Result Date: 01/22/2024 IMPRESSION: No  active disease.  Cardiomegaly    [name] Simon, MD 01/23/2024, 6:59 AM  PGY-3, [facility] FPTS Intern pager: [phone], text pages welcome Secure chat group [facility] Teaching Service

## 2024-01-23 NOTE — Plan of Care (Signed)
 Spoke with PA Amy Esterwood about patient's case for GI referral. Informed patient will be added to the GI list and will be followed by the GI team. Appreciate GI involvement in the management.  [name] Simon, MD PGY-3, [facility] Resident  Please page [phone] with questions.

## 2024-01-23 NOTE — ED Notes (Signed)
 Patient unable to move to floor until BP in range

## 2024-01-24 ENCOUNTER — Other Ambulatory Visit: Payer: Self-pay

## 2024-01-24 ENCOUNTER — Encounter (HOSPITAL_COMMUNITY): Payer: Self-pay | Admitting: Family Medicine

## 2024-01-24 DIAGNOSIS — Z860101 Personal history of adenomatous and serrated colon polyps: Secondary | ICD-10-CM

## 2024-01-24 DIAGNOSIS — I160 Hypertensive urgency: Principal | ICD-10-CM | POA: Insufficient documentation

## 2024-01-24 DIAGNOSIS — R112 Nausea with vomiting, unspecified: Secondary | ICD-10-CM

## 2024-01-24 DIAGNOSIS — K8689 Other specified diseases of pancreas: Secondary | ICD-10-CM

## 2024-01-24 DIAGNOSIS — Z992 Dependence on renal dialysis: Secondary | ICD-10-CM

## 2024-01-24 DIAGNOSIS — F32A Depression, unspecified: Secondary | ICD-10-CM | POA: Insufficient documentation

## 2024-01-24 DIAGNOSIS — K529 Noninfective gastroenteritis and colitis, unspecified: Secondary | ICD-10-CM | POA: Diagnosis not present

## 2024-01-24 DIAGNOSIS — N186 End stage renal disease: Secondary | ICD-10-CM

## 2024-01-24 DIAGNOSIS — R1110 Vomiting, unspecified: Secondary | ICD-10-CM | POA: Diagnosis not present

## 2024-01-24 DIAGNOSIS — K8020 Calculus of gallbladder without cholecystitis without obstruction: Secondary | ICD-10-CM | POA: Diagnosis not present

## 2024-01-24 DIAGNOSIS — I169 Hypertensive crisis, unspecified: Secondary | ICD-10-CM | POA: Diagnosis not present

## 2024-01-24 HISTORY — DX: Hypertensive urgency: I16.0

## 2024-01-24 LAB — BASIC METABOLIC PANEL
Anion gap: 13 (ref 5–15)
BUN: 25 mg/dL — ABNORMAL HIGH (ref 6–20)
CO2: 30 mmol/L (ref 22–32)
Calcium: 7.6 mg/dL — ABNORMAL LOW (ref 8.9–10.3)
Chloride: 94 mmol/L — ABNORMAL LOW (ref 98–111)
Creatinine, Ser: 6.65 mg/dL — ABNORMAL HIGH (ref 0.61–1.24)
GFR, Estimated: 9 mL/min — ABNORMAL LOW (ref >60)
Glucose, Bld: 226 mg/dL — ABNORMAL HIGH (ref 70–99)
Potassium: 3.5 mmol/L (ref 3.5–5.1)
Sodium: 137 mmol/L (ref 135–145)

## 2024-01-24 LAB — CBC
HCT: 32.8 % — ABNORMAL LOW (ref 39.0–52.0)
Hemoglobin: 10.2 g/dL — ABNORMAL LOW (ref 13.0–17.0)
MCH: 26.3 pg (ref 26.0–34.0)
MCHC: 31.1 g/dL (ref 30.0–36.0)
MCV: 84.5 fL (ref 80.0–100.0)
Platelets: 111 10*3/uL — ABNORMAL LOW (ref 150–400)
RBC: 3.88 MIL/uL — ABNORMAL LOW (ref 4.22–5.81)
RDW: 19.2 % — ABNORMAL HIGH (ref 11.5–15.5)
WBC: 8.8 10*3/uL (ref 4.0–10.5)
nRBC: 0 % (ref 0.0–0.2)

## 2024-01-24 LAB — GLUCOSE, CAPILLARY
Glucose-Capillary: 149 mg/dL — ABNORMAL HIGH (ref 70–99)
Glucose-Capillary: 249 mg/dL — ABNORMAL HIGH (ref 70–99)
Glucose-Capillary: 144 mg/dL — ABNORMAL HIGH (ref 70–99)
Glucose-Capillary: 255 mg/dL — ABNORMAL HIGH (ref 70–99)

## 2024-01-24 LAB — HEPATITIS B SURFACE ANTIGEN: Hepatitis B Surface Ag: NONREACTIVE

## 2024-01-24 MED ORDER — TRIMETHOBENZAMIDE HCL 100 MG/ML IM SOLN: 200.0000 mg | Freq: Three times a day (TID) | INTRAMUSCULAR | Status: DC | PRN

## 2024-01-24 MED ORDER — METOPROLOL TARTRATE 12.5 MG HALF TABLET
12.5000 mg | ORAL_TABLET | Freq: Two times a day (BID) | ORAL | Status: DC
  Administered 2024-01-24 – 2024-01-25 (×3): 12.5 mg via ORAL
  Filled 2024-01-24 (×3): qty 1

## 2024-01-24 MED ORDER — METOPROLOL TARTRATE 25 MG PO TABS: 25.0000 mg | ORAL_TABLET | Freq: Two times a day (BID) | ORAL | Status: DC

## 2024-01-24 MED ORDER — HYDRALAZINE HCL 50 MG PO TABS
100.0000 mg | ORAL_TABLET | Freq: Two times a day (BID) | ORAL | Status: DC
  Administered 2024-01-24 – 2024-01-26 (×5): 100 mg via ORAL
  Filled 2024-01-24 (×6): qty 2

## 2024-01-24 MED ORDER — METOPROLOL SUCCINATE ER 25 MG PO TB24: 25.0000 mg | ORAL_TABLET | Freq: Every day | ORAL | Status: DC

## 2024-01-24 MED ORDER — FLUOXETINE HCL 20 MG PO CAPS
20.0000 mg | ORAL_CAPSULE | Freq: Every day | ORAL | Status: DC
  Administered 2024-01-24 – 2024-01-26 (×3): 20 mg via ORAL
  Filled 2024-01-24 (×3): qty 1

## 2024-01-24 MED ORDER — PANTOPRAZOLE SODIUM 40 MG PO TBEC
40.0000 mg | DELAYED_RELEASE_TABLET | Freq: Every day | ORAL | Status: DC
  Administered 2024-01-24 – 2024-01-26 (×3): 40 mg via ORAL
  Filled 2024-01-24 (×3): qty 1

## 2024-01-24 MED ORDER — CHLORHEXIDINE GLUCONATE CLOTH 2 % EX PADS
6.0000 | MEDICATED_PAD | Freq: Every day | CUTANEOUS | Status: DC
  Administered 2024-01-26: 6 via TOPICAL

## 2024-01-24 NOTE — Assessment & Plan Note (Signed)
>>  ASSESSMENT AND PLAN FOR T2DM (TYPE 2 DIABETES MELLITUS) (HCC) WRITTEN ON 01/24/2024 11:27 AM BY TOMA MATAS, MD  Last A1c 6.1 in November 2024.  CBGs appropriate, only 2 units SAI 3/2. - vsSSI - No LAI - CBGs ACHS

## 2024-01-24 NOTE — Assessment & Plan Note (Addendum)
 BP gradually improving but still elevated overnight.  Home medication includes metoprolol 25 mg daily, carvedilol 12.5 mg BID, hydralazine 50 mg BID and diltiazem XR 300 mg daily.  Transitioning to oral meds now that tolerating PO. - Transition from IV hydralazine and metoprolol to home hydralazine 100 mg BID and metoprolol tartrate 12.5 mg BID - Continue diltiazem 300 mg daily - Monitor with routine vitals

## 2024-01-24 NOTE — Plan of Care (Signed)

## 2024-01-24 NOTE — Assessment & Plan Note (Addendum)
 CTAP showed nonspecific colitis, which could be from viral gastroenteritis.  Pain now resolved.

## 2024-01-24 NOTE — Progress Notes (Signed)
 Daily Progress Note Intern Pager: [phone]  Patient name: [name] [name] Medical record number: [id number] Date of birth: 03/09/1971 Age: 53 y.o. Gender: male  Primary Care Provider: [name], MD Consultants: GI Code Status: FULL  Pt Overview and Major Events to Date:  3/1: Admitted 3/2: GI consulted  3/3: Vomiting resolved, diet advancing  Assessment and Plan: [name] [name] is a 53 y.o. male with a pertinent PMH of T2DM, PAD, ESRD on HD (TTS), R BKA, GERD, HLD, MDD, and HTN who presented with uncontrolled hypertension and intractable nausea/vomiting.  Nausea and vomiting now improved, last vomiting 3/2 AM.  Repeat EKG with QTc 486.  Reglan discontinued last night and 2 AM dose of Zofran skipped, yet still remains feeling better.  Will discontinue Zofran in favor of Tigan if needed, but optimistic nausea and vomiting are resolving.  BP is improving, but still elevated to 188/93 at peak.  Also more likely to tolerate oral HTN meds, will transition back to metoprolol and hydralazine PO.  Will not resume carvedilol to avoid double dosing beta blockers.  Furthermore, will resume metoprolol tartrate initially given patient's borderline normal HR in case need to discontinue BB.  If patient continues to improve without further vomiting and diet is successfully advanced, can consider D/C after HD tomorrow. Assessment & Plan Intractable vomiting Chronic issue with some colitis noted on CTAP.  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 - D/C Zofran and Reglan in setting of borderline prolonged QTc, order Tigan 200 mg IM Q8h PRN - Pantoprazole 40 mg transitioned to PO - Advance diet as tolerated, now full liquids and can progress to solids by dinner if doing well - Consult GI, appreciate recs:  -Considering CTA or MRA for further evaluation  -Symptomatic management and supportive care  -Held off on endoscopy given  previous evaluations Severe hypertension BP gradually improving but still elevated overnight.  Home medication includes metoprolol 25 mg daily, carvedilol 12.5 mg BID, hydralazine 50 mg BID and diltiazem XR 300 mg daily.  Transitioning to oral meds now that tolerating PO. - Transition from IV hydralazine and metoprolol to home hydralazine 100 mg BID and metoprolol tartrate 12.5 mg BID - Continue diltiazem 300 mg daily - Monitor with routine vitals Depression Likely undertreated/untreated depression at baseline.  Unclear if patient compliant with medicines outpatient, but reportedly on fluoxetine 20 mg daily.  Very flat affect and noncommittal responses. -Resume fluoxetine 20 mg daily Chest pain Resolved.  ACS workup unremarkable with flat troponin and NSR on last EKG.  Likely MSK in setting of vomiting, possible GERD. - Tylenol PRN for pain - Continue pantoprazole - AM CBC, CMP ESRD (end stage renal disease) on dialysis (HCC) HD TTS schedule.  Last HD was 3/1. - Nephrology consulted T2DM (type 2 diabetes mellitus) (HCC) Last A1c 6.1 in November 2024.  CBGs appropriate, only 2 units SAI 3/2. - vsSSI - No LAI - CBGs ACHS Abdominal pain CTAP showed nonspecific colitis, which could be from viral gastroenteritis.  Pain now resolved. Hiccups Improving, mild.  Continue to monitor, can treat if more bothersome.  Chronic and Stable Problems: Neuropathy: Restart home gabapentin when able to tolerate PO  FEN/GI: Clear liquids, advance as tolerated PPx: SubQ heparin Dispo: Home   Subjective:  Patient reports mild nausea today. No further emesis since early yesterday. Asking for solid foods and wants to eat.  Objective: Temp:  [98 F (36.7 C)-98.6 F (37 C)]  98.1 F (36.7 C) (03/03 0415) Pulse Rate:  [65-88] 65 (03/03 0415) Resp:  [18-21] 18 (03/03 0415) BP: (163-213)/(85-114) 163/85 (03/03 0415) SpO2:  [94 %-99 %] 96 % (03/03 0415) Weight:  [97 kg] 97 kg (03/02 1323)  Physical  Exam: General: Age-appropriate, resting comfortably in bed, NAD, alert and at baseline. Cardiovascular: Regular rate and rhythm. Normal S1/S2. No murmurs, rubs, or gallops appreciated. 2+ radial pulses. Pulmonary: Clear bilaterally to ascultation. No increased WOB, no accessory muscle usage on room air. No wheezes, crackles, or rhonchi. Abdominal: Normoactive bowel sounds, nondistended. No tenderness to deep or light palpation. No rebound or guarding. No HSM. Extremities: R BKA, no LLE edema.  Capillary refill <2 seconds. Psych: Flat affect, low mood.  Short responses.  Denies SI/HI.  Laboratory: Most recent CBC Lab Results  Component Value Date   WBC 8.8 01/24/2024   HGB 10.2 (L) 01/24/2024   HCT 32.8 (L) 01/24/2024   MCV 84.5 01/24/2024   PLT 111 (L) 01/24/2024   Most recent BMP    Latest Ref Rng & Units 01/24/2024    3:23 AM  BMP  Glucose 70 - 99 mg/dL 213   BUN 6 - 20 mg/dL 25   Creatinine 0.86 - 1.24 mg/dL 5.78   Sodium 469 - 629 mmol/L 137   Potassium 3.5 - 5.1 mmol/L 3.5   Chloride 98 - 111 mmol/L 94   CO2 22 - 32 mmol/L 30   Calcium 8.9 - 10.3 mg/dL 7.6     Other pertinent labs: -Hgb 11.4>10.2  New Imaging/Diagnostic Tests: -CT with nonspecific colitis of the ascending, transverse and descending colon  Charmain Diosdado Sharion Dove, MD 01/24/2024, 7:51 AM  PGY-1, [place] [facility] FPTS Intern pager: [phone], text pages welcome Secure chat group [facility] Teaching Service

## 2024-01-24 NOTE — Assessment & Plan Note (Addendum)
 Improving, mild.  Continue to monitor, can treat if more bothersome.

## 2024-01-24 NOTE — Assessment & Plan Note (Addendum)
 Last A1c 6.1 in November 2024.  CBGs appropriate, only 2 units SAI 3/2. - vsSSI - No LAI - CBGs ACHS

## 2024-01-24 NOTE — Progress Notes (Addendum)
 Daily Progress Note  DOA: 01/22/2024 Hospital Day: 3   Chief Complaint: nausea, vomiting and colitis on CT    Attending physician's note   I have taken a history, reviewed the chart and examined the patient. I performed a substantive portion of this encounter, including complete performance of at least one of the key components, in conjunction with the APP. I agree with the APP's note, impression and recommendations.    No further vomiting, nausea is improving. Possible etiology includes acute viral gastroenteritis, gastroparesis, hypertensive crisis with significant fluctuation in blood pressure or hypoglycemic events Advance diet as tolerated to low-fat high-protein diet Small frequent meals Antireflux measures  Noted signs of colonic thickening and pericolonic stranding, consider CT angio to exclude ischemic colitis, may be low yield.  He has known peripheral vascular disease, likely does not need any vascular intervention unless has critical stenosis, clinically his symptoms are not consistent with that  History of adenomatous colon polyps, schedule outpatient follow-up for surveillance colonoscopy with Dr. Myrtie Neither  Pancreatic lesion consistent with benign IPMN, recommended follow-up MRI in 6 months   The patient was provided an opportunity to ask questions and all were answered. The patient agreed with the plan and demonstrated an understanding of the instructions.   Iona Beard , MD [phone]     ASSESSMENT    Brief Narrative:  [name] [name] is a 53 y.o. year old male with DM, ESRD and other medical problems as listed below and in PMH,.  Admitted with admitted with severe HTN, N/V and abdominal pain. Findings of colitis on CT scan . GI saw in consult 3/2  Hypertensive crisis ( poa).   Nausea, vomiting. Possibly gastroenteritis vs diabetic gastroparesis.  No associated fevers or leukocytosis. Symptoms resolving. .   Colitis on CT scan. Could be ischemic  though distribution involving several areas of colon He doesn't have symptoms of infectious colitis such as diarrhea, abdominal pain. No blood in stool.   Pancreatic lesion, ? 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  MRI showing   Cholelithiasis, asymptomatic at this point.   History of polyps.  Last colonoscopy 2021 with  3 diminutive polyps removed which were tubular adenomas   DM ESRD on HD   PLAN   --Since N/V resolved will advance to soft diet - he is asking for food --Considering CTA vrs MRA  --Outpatient surveillance colonoscopy when able --Further evaluation / management of pancreatic lesion to be done outpatient  Subjective   Hungry. No N/V. No diarrhea. Formed BM on Saturday   Objective    Recent Labs    01/22/24 2003 01/23/24 0516 01/24/24 0323  WBC 8.5 9.0 8.8  HGB 12.1* 11.4* 10.2*  HCT 39.0 37.1* 32.8*  PLT 120* 119* 111*   BMET Recent Labs    01/22/24 2003 01/23/24 0516 01/24/24 0323  NA 139 139 137  K 3.7 3.4* 3.5  CL 96* 97* 94*  CO2 29 30 30   GLUCOSE 173* 200* 226*  BUN 13 17 25*  CREATININE 4.03* 4.90* 6.65*  CALCIUM 8.8* 8.1* 7.6*   LFT Recent Labs    01/23/24 0516  PROT 5.4*  ALBUMIN 2.6*  AST 13*  ALT 9  ALKPHOS 81  BILITOT 0.5   PT/INR No results for input(s): "LABPROT", "INR" in the last 72 hours.   Imaging:  CT ABDOMEN PELVIS WO CONTRAST CLINICAL DATA:  Abdominal pain, bowel obstruction suspected, vomiting.  EXAM: CT ABDOMEN AND PELVIS WITHOUT CONTRAST  TECHNIQUE: Multidetector CT imaging  of the abdomen and pelvis was performed following the standard protocol without IV contrast.  RADIATION DOSE REDUCTION: This exam was performed according to the departmental dose-optimization program which includes automated exposure control, adjustment of the mA and/or kV according to patient size and/or use of iterative reconstruction technique.  COMPARISON:  MRI 12/01/2023 and CT abdomen and pelvis  11/30/2023  FINDINGS: Lower chest: No acute abnormality.  Hepatobiliary: Unremarkable liver. Cholelithiasis. No evidence of acute cholecystitis. No biliary dilation.  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  Spleen: Unremarkable.  Adrenals/Urinary Tract: Stable adrenal glands including left adrenal adenoma. No follow-up recommended. No urinary calculi or hydronephrosis. Bladder is unremarkable.  Stomach/Bowel: Normal caliber large and small bowel. Mild wall thickening with pericolonic fat stranding about the ascending colon. Additional mild wall thickening of the transverse and descending colon. The appendix is normal.Stomach is within normal limits.  Vascular/Lymphatic: Aortic atherosclerosis. No enlarged abdominal or pelvic lymph nodes.  Reproductive: Unremarkable.  Other: No free intraperitoneal fluid or air.  Body wall edema.  Musculoskeletal: No acute fracture. Findings rod and screw fixation across a right intertrochanteric fracture. Fracture line remains visible.  IMPRESSION: 1. Nonspecific colitis of the ascending, transverse and descending colon. 2. Cholelithiasis without evidence of acute cholecystitis. 3. Body wall edema. 4. Aortic Atherosclerosis (ICD10-I70.0).  Electronically Signed   By: Minerva Fester M.D.   On: 01/22/2024 22:15 DG Chest Port 1 View CLINICAL DATA:  Chest pain  EXAM: PORTABLE CHEST 1 VIEW  COMPARISON:  11/30/2023  FINDINGS: Right-sided central venous catheter tip at the cavoatrial junction. Cardiomegaly. No acute airspace disease, pleural effusion or pneumothorax  IMPRESSION: No active disease.  Cardiomegaly  Electronically Signed   By: Jasmine Pang M.D.   On: 01/22/2024 20:44     MRI / MRCP 12/01/23 There is extensive ductal ectasia in side branch cystic dilatation in the tail the pancreas. Findings are similar to mildly progressed  from chest MRI 07/03/2022. Largest cluster of cysts measures 2.4 x 2.6 cm in the midbody on image 22/3. No downstream ductal dilatation. No evidence of pancreatic inflammation.  Scheduled inpatient medications:   Chlorhexidine Gluconate Cloth  6 each Topical Daily   diltiazem  300 mg Oral Daily   feeding supplement (NEPRO CARB STEADY)  237 mL Oral BID BM   FLUoxetine  20 mg Oral Daily   heparin  5,000 Units Subcutaneous Q8H   hydrALAZINE  100 mg Oral BID   insulin aspart  0-6 Units Subcutaneous TID WC   metoprolol tartrate  12.5 mg Oral BID   pantoprazole  40 mg Oral Daily   sodium chloride flush  10-40 mL Intracatheter Q12H   Continuous inpatient infusions:  PRN inpatient medications: acetaminophen, sodium chloride flush, trimethobenzamide  Vital signs in last 24 hours: Temp:  [98.1 F (36.7 C)-98.6 F (37 C)] 98.3 F (36.8 C) (03/03 0905) Pulse Rate:  [65-77] 68 (03/03 0905) Resp:  [18] 18 (03/03 0905) BP: (163-196)/(82-113) 174/82 (03/03 0905) SpO2:  [94 %-96 %] 95 % (03/03 0905) Weight:  [97 kg] 97 kg (03/02 1323) Last BM Date : 01/21/24  Intake/Output Summary (Last 24 hours) at 01/24/2024 1208 Last data filed at 01/24/2024 0228 Gross per 24 hour  Intake 490.61 ml  Output 0 ml  Net 490.61 ml    Intake/Output from previous day: 03/02 0701 - 03/03 0700 In: 610.6 [P.O.:600; IV Piggyback:10.6] Out: 0  Intake/Output this shift: No intake/output data recorded.  Physical Exam:  General: Alert male in NAD Heart:  Regular rate and rhythm.  Pulmonary: Normal respiratory effort Abdomen: Soft, nondistended, nontender. Normal bowel sounds. Extremities: No lower extremity edema  Neurologic: Alert and oriented Psych: Pleasant. Cooperative. Insight appears normal.      LOS: 1 day   Willette Cluster ,NP 01/24/2024, 12:08 PM

## 2024-01-24 NOTE — Consult Note (Signed)
 Renal Service Consult Note [facility]  [name] [name] 01/24/2024 [name] Krabbe, MD Requesting Physician: Dr. Lum [name]  Reason for Consult: ESRD pt w/ chest pain, N/V and coughing HPI: The patient is a 53 y.o. year-old w/ PMH as below who presented to ED w/ sx's as above on 3/01. Also markedly tired, new puffy eyes. Back in Jan he was admitted to [facility] team for abd pain, hypotension and confusion. In ED bp's were > 200 initially. This was thought possibly to be due to N/V and not keeping BP meds down. Pt was admitted for intractable N/V w/ abdominal pain. We are asked to see for dialysis.    Pt seen in room. Vague historian, no c/o's at this time.    PMH Anemia Depression Constipation  DM2 Depression ESRD on HD HL HTN Necrotizing fasciitis Neuromuscular disorder PAD    ROS - denies CP, no joint pain, no HA, no blurry vision, no rash, no diarrhea, no nausea/ vomiting, no dysuria, no difficulty voiding   Past Medical History  Past Medical History:  Diagnosis Date   Abdominal pain 11/30/2023   Abdominal tenderness 10/14/2023   Allergy    seasonal and environmental   Anemia    ARF (acute renal failure) (HCC) 09/2017   Chest pain 12/01/2023   Chronic constipation 05/13/2020   CKD (chronic kidney disease) stage 4, GFR 15-29 ml/min (HCC)    Depression    Diabetes mellitus without complication (HCC) 2019   GERD (gastroesophageal reflux disease)    Hyperlipidemia    Hypertension    Necrotizing fasciitis (HCC) 10/13/2017   Neuromuscular disorder (HCC)    neuropathy   PAD (peripheral artery disease) (HCC)    Secondary hyperparathyroidism (HCC)    Wound dehiscence 11/24/2017   Past Surgical History  Past Surgical History:  Procedure Laterality Date   AMPUTATION Right 10/13/2017   Procedure: RIGHT BELOW KNEE AMPUTATION;  Surgeon: Nadara Mustard, MD;  Location: [facility] OR;  Service: Orthopedics;  Laterality: Right;   AMPUTATION Right 11/24/2017   Procedure:  AMPUTATION BELOW KNEE REVISION;  Surgeon: Nadara Mustard, MD;  Location: [facility] OR;  Service: Orthopedics;  Laterality: Right;   AMPUTATION TOE Left    AV FISTULA PLACEMENT Left 10/19/2023   Procedure: LEFT BRACHIOCEPHALIC ARTERIOVENOUS (AV) FISTULA CREATION;  Surgeon: Daria Pastures, MD;  Location: [name] OR;  Service: Vascular;  Laterality: Left;   BIOPSY  09/02/2021   Procedure: BIOPSY;  Surgeon: Imogene Burn, MD;  Location: [facility] ENDOSCOPY;  Service: Gastroenterology;;   BIOPSY  02/18/2023   Procedure: BIOPSY;  Surgeon: Benancio Deeds, MD;  Location: [facility] ENDOSCOPY;  Service: Gastroenterology;;   COLONOSCOPY  05/11/2019   ESOPHAGOGASTRODUODENOSCOPY (EGD) WITH PROPOFOL N/A 09/02/2021   Procedure: ESOPHAGOGASTRODUODENOSCOPY (EGD) WITH PROPOFOL;  Surgeon: Imogene Burn, MD;  Location: [facility] ENDOSCOPY;  Service: Gastroenterology;  Laterality: N/A;   ESOPHAGOGASTRODUODENOSCOPY (EGD) WITH PROPOFOL N/A 02/18/2023   Procedure: ESOPHAGOGASTRODUODENOSCOPY (EGD) WITH PROPOFOL;  Surgeon: Benancio Deeds, MD;  Location: [facility] ENDOSCOPY;  Service: Gastroenterology;  Laterality: N/A;   INTRAMEDULLARY (IM) NAIL INTERTROCHANTERIC Right 10/15/2023   Procedure: INTRAMEDULLARY (IM) NAIL INTERTROCHANTERIC;  Surgeon: Roby Lofts, MD;  Location: [facility] OR;  Service: Orthopedics;  Laterality: Right;   IR FLUORO GUIDE CV LINE RIGHT  10/15/2023   IR US GUIDE VASC ACCESS RIGHT  10/15/2023   NO PAST SURGERIES     POLYPECTOMY     WISDOM TOOTH EXTRACTION     Family History  Family History  Problem Relation  Age of Onset   Diabetes Mellitus II Mother    Depression Mother    Colon polyps Mother    Hypertension Mother    High Cholesterol Mother    Heart disease Father        CABG x 4.  04/2017   Diabetes Father    High Cholesterol Father    Hypertension Father    Heart disease Maternal Grandmother    Heart disease Maternal Grandfather    Colon cancer Neg Hx    Esophageal cancer Neg Hx    Liver cancer Neg Hx     Pancreatic cancer Neg Hx    Rectal cancer Neg Hx    Stomach cancer Neg Hx    Social History  reports that he has been smoking cigarettes. He started smoking about 35 years ago. He has a 17.8 pack-year smoking history. He has never used smokeless tobacco. He reports that he does not drink alcohol and does not use drugs. Allergies  Allergies  Allergen Reactions   Iron Nausea And Vomiting    With the iron tablets   Tape Rash    Rash at site of tape--okay to use paper tape   Home medications Prior to Admission medications   Medication Sig Start Date End Date Taking? Authorizing Provider  acetaminophen (TYLENOL) 500 MG tablet Take 2 tablets (1,000 mg total) by mouth every 6 (six) hours as needed. Patient taking differently: Take 1,000 mg by mouth every 6 (six) hours as needed for mild pain (pain score 1-3). 10/26/23  Yes Baloch, Mahnoor, MD  atorvastatin (LIPITOR) 40 MG tablet Take 1 tablet (40 mg total) by mouth at bedtime. Patient taking differently: Take 40 mg by mouth daily. 01/17/24  Yes [name], MD  calcitRIOL (ROCALTROL) 0.25 MCG capsule Take 1 capsule (0.25 mcg total) by mouth Every Tuesday,Thursday,and Saturday with dialysis. 10/26/23  Yes Baloch, Mahnoor, MD  carvedilol (COREG) 12.5 MG tablet Take 12.5 mg by mouth 2 (two) times daily with a meal.   Yes [provider]  Darbepoetin Alfa (ARANESP) 150 MCG/0.3ML SOSY injection Inject 0.3 mLs (150 mcg total) into the skin every Saturday at 6 PM. Patient taking differently: Inject 150 mcg into the skin every Saturday at 6 PM. At dialysis 10/30/23  Yes Baloch, Mahnoor, MD  diclofenac Sodium (VOLTAREN) 1 % GEL Apply 4 g topically 4 (four) times daily. Patient taking differently: Apply 4 g topically 4 (four) times daily as needed (pain). 11/29/23  Yes [name], MD  diltiazem (CARDIZEM CD) 300 MG 24 hr capsule TAKE 1 CAPSULE(300 MG) BY MOUTH DAILY 12/31/23  Yes [name], MD  FLUoxetine (PROZAC) 20 MG capsule Take 1  capsule (20 mg total) by mouth daily. 12/30/23  Yes [name], MD  gabapentin (NEURONTIN) 300 MG capsule Take 1 capsule (300 mg total) by mouth daily. 12/30/23  Yes [name], MD  hydrALAZINE (APRESOLINE) 50 MG tablet Take 1 tablet (50 mg total) by mouth 2 (two) times daily. Patient taking differently: Take 100 mg by mouth 2 (two) times daily. 10/26/23  Yes Baloch, Mahnoor, MD  insulin lispro (HUMALOG) 100 UNIT/ML injection 0-6 Units, Subcutaneous, 3 times daily with meals, First dose on Sun 10/17/23 at 0800 Correction coverage: Very Sensitive (ESRD/Dialysis) CBG < 70: Implement Hypoglycemia Standing Orders and refer to Hypoglycemia Standing Orders sidebar report CBG 70 - 120: 0 units CBG 121 - 150: 0 units CBG 151 - 200: 1 unit CBG 201-250: 2 units CBG 251-300: 3 units CBG 301-350: 4 units CBG  351-400: 5 units CBG > 400: Give 6 units and call MD Patient taking differently: 0-6 Units, Subcutaneous, 3 times daily with meals, as needed  Correction coverage: Very Sensitive (ESRD/Dialysis) CBG < 70: Implement Hypoglycemia Standing Orders and refer to Hypoglycemia Standing Orders sidebar report CBG 70 - 120: 0 units CBG 121 - 150: 0 units CBG 151 - 200: 1 unit CBG 201-250: 2 units CBG 251-300: 3 units CBG 301-350: 4 units CBG 351-400: 5 units CBG > 400: Give 6 units and call MD 10/26/23  Yes Baloch, Mahnoor, MD  lidocaine (HM LIDOCAINE PATCH) 4 % Place 1 patch onto the skin daily. Patient taking differently: Place 1 patch onto the skin daily as needed (pain). 11/29/23  Yes [name], MD  meloxicam (MOBIC) 15 MG tablet Take 15 mg by mouth daily.   Yes [provider]  metoprolol succinate (TOPROL-XL) 25 MG 24 hr tablet Take 25 mg by mouth daily. 01/13/24  Yes [provider]  ondansetron (ZOFRAN-ODT) 4 MG disintegrating tablet Take 1 tablet (4 mg total) by mouth every 8 (eight) hours as needed for nausea or vomiting. 12/12/23  Yes [name], MD   oxyCODONE-acetaminophen (PERCOCET) 10-325 MG tablet Take 1 tablet by mouth every 8 (eight) hours as needed for pain. 01/13/24  Yes [provider]  sevelamer carbonate (RENVELA) 800 MG tablet Take 2 tablets (1,600 mg total) by mouth 3 (three) times daily with meals. Patient taking differently: Take 1,600 mg by mouth 2 (two) times daily with a meal. 10/26/23  Yes Baloch, Mahnoor, MD  tamsulosin (FLOMAX) 0.4 MG CAPS capsule TAKE 1 CAPSULE(0.4 MG) BY MOUTH DAILY AFTER SUPPER Patient taking differently: Take 0.4 mg by mouth daily as needed (bladder control). 12/31/23  Yes [name], MD  tiZANidine (ZANAFLEX) 4 MG tablet TAKE 1/2 TABLET(2 MG) BY MOUTH DAILY AS NEEDED FOR MUSCLE SPASMS Patient taking differently: Take 4 mg by mouth daily as needed for muscle spasms. 01/17/24  Yes [name], MD  Chad Cordial ULTRA-THIN LANCETS MISC Check blood sugars before meals twice daily 11/19/17   Julieanne Manson, MD  Blood Glucose Monitoring Suppl (ACCU-CHEK GUIDE ME) w/Device KIT See admin instructions. 02/23/23   [provider]  glucose blood (AGAMATRIX PRESTO TEST) test strip Check blood sugars twice daily before meals 02/02/18   Julieanne Manson, MD  glucose blood test strip 1 each by Other route as needed for other. Use as instructed    [provider]     Vitals:   01/24/24 0415 01/24/24 0905 01/24/24 1402 01/24/24 1639  BP: (!) 163/85 (!) 174/82 (!) 158/78 (!) 166/90  Pulse: 65 68 77 71  Resp: 18 18  18   Temp: 98.1 F (36.7 C) 98.3 F (36.8 C) 98.7 F (37.1 C) 98.6 F (37 C)  TempSrc: Oral  Oral   SpO2: 96% 95% 96% 97%  Weight:      Height:       Exam Gen alert, no distress, chronically ill appearing, fatigued No rash, cyanosis or gangrene, poor skin turgor Sclera anicteric, throat clear  No jvd or bruits Chest clear bilat to bases, no rales/ wheezing RRR no MRG Abd soft ntnd no mass or ascites +bs GU nl male MS R BKA Ext no LE or UE edema Neuro is  alert, Ox 3 , nf    RIJ TDC in place   Renal-related home meds: - pending    OP HD: [facility]  B400   100.9kg   2K bath  RIJ  TDC   Heparin 2000 - last OP HD 3/1, pre wt 100.9 and post wt 99.1 - he wsa 3-6kg over post HD for 2 wks prior to 3/1  - rocaltrol 0.25 mcg po tts - mircera 50 mcg IV q 2 wks, last 2/18, due 3/04   Assessment/ Plan: Intractable vomiting/ abd pain - CT w/ nonspecific colitis. Gallstones w/o evidence of infection. GI consulted.  Uncontrolled HTN - bp's 220/ 110 in ED initially. BP's better today on home meds.  ESRD - on HD TTS. HD today upstairs.  Volume - he is 3-4kg under dry wt and this has acutely happened due to GI losses. Looks sig dry on exam. Will keep even w/ HD.  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  Secondary hyperparathyroidism - CCa in range, add on phos.  PAD - R BKA      Vinson Moselle  MD [facility] 01/24/2024, 6:26 PM  Recent Labs  Lab 01/22/24 2003 01/23/24 0516 01/24/24 0323  HGB 12.1* 11.4* 10.2*  ALBUMIN 2.9* 2.6*  --   CALCIUM 8.8* 8.1* 7.6*  CREATININE 4.03* 4.90* 6.65*  K 3.7 3.4* 3.5   Inpatient medications:  Chlorhexidine Gluconate Cloth  6 each Topical Daily   diltiazem  300 mg Oral Daily   feeding supplement (NEPRO CARB STEADY)  237 mL Oral BID BM   FLUoxetine  20 mg Oral Daily   heparin  5,000 Units Subcutaneous Q8H   hydrALAZINE  100 mg Oral BID   insulin aspart  0-6 Units Subcutaneous TID WC   metoprolol tartrate  12.5 mg Oral BID   pantoprazole  40 mg Oral Daily   sodium chloride flush  10-40 mL Intracatheter Q12H    acetaminophen, sodium chloride flush, trimethobenzamide

## 2024-01-24 NOTE — Assessment & Plan Note (Addendum)
 Resolved.  ACS workup unremarkable with flat troponin and NSR on last EKG.  Likely MSK in setting of vomiting, possible GERD. - Tylenol PRN for pain - Continue pantoprazole - AM CBC, CMP

## 2024-01-24 NOTE — Assessment & Plan Note (Addendum)
 Chronic issue with some colitis noted on CTAP.  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 - D/C Zofran and Reglan in setting of borderline prolonged QTc, order Tigan 200 mg IM Q8h PRN - Pantoprazole 40 mg transitioned to PO - Advance diet as tolerated, now full liquids and can progress to solids by dinner if doing well - Consult GI, appreciate recs:  -Considering CTA or MRA for further evaluation  -Symptomatic management and supportive care  -Held off on endoscopy given previous evaluations

## 2024-01-24 NOTE — Assessment & Plan Note (Signed)
 Likely undertreated/untreated depression at baseline.  Unclear if patient compliant with medicines outpatient, but reportedly on fluoxetine 20 mg daily.  Very flat affect and noncommittal responses. -Resume fluoxetine 20 mg daily

## 2024-01-24 NOTE — Assessment & Plan Note (Addendum)
 HD TTS schedule.  Last HD was 3/1. - Nephrology consulted

## 2024-01-25 ENCOUNTER — Encounter (HOSPITAL_COMMUNITY): Payer: Self-pay | Admitting: Vascular Surgery

## 2024-01-25 ENCOUNTER — Telehealth: Payer: Self-pay

## 2024-01-25 DIAGNOSIS — N186 End stage renal disease: Secondary | ICD-10-CM | POA: Diagnosis not present

## 2024-01-25 DIAGNOSIS — R933 Abnormal findings on diagnostic imaging of other parts of digestive tract: Secondary | ICD-10-CM

## 2024-01-25 DIAGNOSIS — R112 Nausea with vomiting, unspecified: Secondary | ICD-10-CM | POA: Diagnosis not present

## 2024-01-25 DIAGNOSIS — K8689 Other specified diseases of pancreas: Secondary | ICD-10-CM | POA: Diagnosis not present

## 2024-01-25 DIAGNOSIS — R1110 Vomiting, unspecified: Secondary | ICD-10-CM | POA: Diagnosis not present

## 2024-01-25 DIAGNOSIS — I770 Arteriovenous fistula, acquired: Secondary | ICD-10-CM | POA: Insufficient documentation

## 2024-01-25 DIAGNOSIS — Z860101 Personal history of adenomatous and serrated colon polyps: Secondary | ICD-10-CM | POA: Diagnosis not present

## 2024-01-25 DIAGNOSIS — I160 Hypertensive urgency: Secondary | ICD-10-CM | POA: Diagnosis not present

## 2024-01-25 HISTORY — DX: Arteriovenous fistula, acquired: I77.0

## 2024-01-25 LAB — GLUCOSE, CAPILLARY
Glucose-Capillary: 181 mg/dL — ABNORMAL HIGH (ref 70–99)
Glucose-Capillary: 143 mg/dL — ABNORMAL HIGH (ref 70–99)
Glucose-Capillary: 127 mg/dL — ABNORMAL HIGH (ref 70–99)
Glucose-Capillary: 204 mg/dL — ABNORMAL HIGH (ref 70–99)

## 2024-01-25 LAB — CBC
HCT: 35.2 % — ABNORMAL LOW (ref 39.0–52.0)
Hemoglobin: 11.1 g/dL — ABNORMAL LOW (ref 13.0–17.0)
MCH: 26.6 pg (ref 26.0–34.0)
MCHC: 31.5 g/dL (ref 30.0–36.0)
MCV: 84.2 fL (ref 80.0–100.0)
Platelets: 105 10*3/uL — ABNORMAL LOW (ref 150–400)
RBC: 4.18 MIL/uL — ABNORMAL LOW (ref 4.22–5.81)
RDW: 18.9 % — ABNORMAL HIGH (ref 11.5–15.5)
WBC: 7.8 10*3/uL (ref 4.0–10.5)
nRBC: 0 % (ref 0.0–0.2)

## 2024-01-25 LAB — SURGICAL PCR SCREEN
MRSA, PCR: NEGATIVE
Staphylococcus aureus: NEGATIVE

## 2024-01-25 LAB — BASIC METABOLIC PANEL
Anion gap: 12 (ref 5–15)
BUN: 32 mg/dL — ABNORMAL HIGH (ref 6–20)
CO2: 28 mmol/L (ref 22–32)
Calcium: 7 mg/dL — ABNORMAL LOW (ref 8.9–10.3)
Chloride: 95 mmol/L — ABNORMAL LOW (ref 98–111)
Creatinine, Ser: 8.17 mg/dL — ABNORMAL HIGH (ref 0.61–1.24)
GFR, Estimated: 7 mL/min — ABNORMAL LOW (ref >60)
Glucose, Bld: 174 mg/dL — ABNORMAL HIGH (ref 70–99)
Potassium: 3.9 mmol/L (ref 3.5–5.1)
Sodium: 135 mmol/L (ref 135–145)

## 2024-01-25 LAB — PHOSPHORUS: Phosphorus: 5.7 mg/dL — ABNORMAL HIGH (ref 2.5–4.6)

## 2024-01-25 MED ORDER — LOSARTAN POTASSIUM 25 MG PO TABS
25.0000 mg | ORAL_TABLET | Freq: Every day | ORAL | Status: DC
  Administered 2024-01-26: 25 mg via ORAL
  Filled 2024-01-25: qty 1

## 2024-01-25 MED ORDER — HEPARIN SODIUM (PORCINE) 1000 UNIT/ML IJ SOLN
2000.0000 [IU] | Freq: Once | INTRAMUSCULAR | Status: AC
  Administered 2024-01-25: 2000 [IU] via INTRAVENOUS

## 2024-01-25 MED ORDER — PENTAFLUOROPROP-TETRAFLUOROETH EX AERO: 1.0000 | INHALATION_SPRAY | CUTANEOUS | Status: DC | PRN

## 2024-01-25 MED ORDER — HEPARIN SODIUM (PORCINE) 1000 UNIT/ML DIALYSIS
1000.0000 [IU] | INTRAMUSCULAR | Status: AC | PRN
  Administered 2024-01-25: 1000 [IU] via INTRAVENOUS_CENTRAL
  Filled 2024-01-25: qty 1

## 2024-01-25 MED ORDER — HEPARIN SODIUM (PORCINE) 1000 UNIT/ML DIALYSIS
1000.0000 [IU] | INTRAMUSCULAR | Status: DC | PRN
  Administered 2024-01-25: 3800 [IU]
  Filled 2024-01-25: qty 1

## 2024-01-25 MED ORDER — ANTICOAGULANT SODIUM CITRATE 4% (200MG/5ML) IV SOLN: 5.0000 mL | Status: DC | PRN

## 2024-01-25 MED ORDER — HEPARIN SODIUM (PORCINE) 5000 UNIT/ML IJ SOLN
5000.0000 [IU] | Freq: Three times a day (TID) | INTRAMUSCULAR | Status: AC
  Filled 2024-01-25: qty 1

## 2024-01-25 MED ORDER — SODIUM CHLORIDE 0.9% FLUSH
3.0000 mL | Freq: Two times a day (BID) | INTRAVENOUS | Status: DC
  Administered 2024-01-25: 3 mL via INTRAVENOUS

## 2024-01-25 MED ORDER — ACETAMINOPHEN 500 MG PO TABS
1000.0000 mg | ORAL_TABLET | Freq: Once | ORAL | Status: AC
  Administered 2024-01-26: 1000 mg via ORAL
  Filled 2024-01-25: qty 2

## 2024-01-25 MED ORDER — METOPROLOL SUCCINATE ER 25 MG PO TB24
25.0000 mg | ORAL_TABLET | Freq: Every day | ORAL | Status: DC
  Administered 2024-01-26: 25 mg via ORAL
  Filled 2024-01-25: qty 1

## 2024-01-25 MED ORDER — CHLORHEXIDINE GLUCONATE 4 % EX SOLN
60.0000 mL | Freq: Once | CUTANEOUS | Status: DC
  Filled 2024-01-25: qty 60

## 2024-01-25 MED ORDER — ALTEPLASE 2 MG IJ SOLR: 2.0000 mg | Freq: Once | INTRAMUSCULAR | Status: DC | PRN

## 2024-01-25 MED ORDER — LIDOCAINE-PRILOCAINE 2.5-2.5 % EX CREA: 1.0000 | TOPICAL_CREAM | CUTANEOUS | Status: DC | PRN

## 2024-01-25 MED ORDER — LIDOCAINE HCL (PF) 1 % IJ SOLN: 5.0000 mL | INTRAMUSCULAR | Status: DC | PRN

## 2024-01-25 MED ORDER — SODIUM CHLORIDE 0.9% FLUSH: 3.0000 mL | INTRAVENOUS | Status: DC | PRN

## 2024-01-25 MED ORDER — METOPROLOL TARTRATE 12.5 MG HALF TABLET
12.5000 mg | ORAL_TABLET | Freq: Two times a day (BID) | ORAL | Status: AC
  Administered 2024-01-25: 12.5 mg via ORAL
  Filled 2024-01-25: qty 1

## 2024-01-25 MED ORDER — NEPRO/CARBSTEADY PO LIQD: 237.0000 mL | ORAL | Status: DC | PRN

## 2024-01-25 MED ORDER — CHLORHEXIDINE GLUCONATE 4 % EX SOLN: 60.0000 mL | Freq: Once | CUTANEOUS | Status: DC

## 2024-01-25 MED ORDER — CEFAZOLIN SODIUM-DEXTROSE 2-4 GM/100ML-% IV SOLN
2.0000 g | INTRAVENOUS | Status: AC
  Administered 2024-01-26: 2 g via INTRAVENOUS
  Filled 2024-01-25: qty 100

## 2024-01-25 MED ORDER — HEPARIN SODIUM (PORCINE) 1000 UNIT/ML DIALYSIS
2000.0000 [IU] | Freq: Once | INTRAMUSCULAR | Status: DC
  Filled 2024-01-25: qty 2

## 2024-01-25 NOTE — Assessment & Plan Note (Signed)
 CTAP showed nonspecific colitis, which could be from viral gastroenteritis.  Pain now resolved.

## 2024-01-25 NOTE — Assessment & Plan Note (Addendum)
 Continue to monitor, can treat if return.

## 2024-01-25 NOTE — Telephone Encounter (Signed)
 [name] [name],  I sent you a staff message a while ago about getting him a hospital follow up. He needs to see Dr. Myrtie Neither instead of one of the APPs. Should be seen in a couple of months with Dr. Myrtie Neither.  Thanks   Appt - nausea/vomiting/colitis

## 2024-01-25 NOTE — Assessment & Plan Note (Addendum)
 Last A1c 6.1 in November 2024.  CBGs appropriate, only 2 units SAI daily. - vsSSI - No LAI - CBGs ACHS

## 2024-01-25 NOTE — Evaluation (Signed)
 Physical Therapy Evaluation Patient Details Name: [name] [name] MRN: [id number] DOB: 08/16/1971 Today's Date: 01/25/2024  History of Present Illness  Pt is a 53 y/o M presenting to ED on 3/1 from HD center with uncontrolled HTN and intractable n/v. CT suggestive of colitis. PMH includes DM2, ESRD on HD TTS, R BKA, GERD, HLD, MDD, HTN  Clinical Impression  Pt admitted with above diagnosis. Pt was able to walk in hallway with supervision for safety. Pt is most likely close to baseline and desires to improve his ambulation distance and balance. Recommend Outpt PT to address balance and will follow acutely. Mobility specialists to see pt as well.  Pt currently with functional limitations due to the deficits listed below (see PT Problem List). Pt will benefit from acute skilled PT to increase their independence and safety with mobility to allow discharge.           If plan is discharge home, recommend the following: A little help with walking and/or transfers;Assistance with cooking/housework;Assist for transportation;Help with stairs or ramp for entrance   Can travel by private vehicle        Equipment Recommendations None recommended by PT  Recommendations for Other Services       Functional Status Assessment Patient has had a recent decline in their functional status and demonstrates the ability to make significant improvements in function in a reasonable and predictable amount of time.     Precautions / Restrictions Precautions Precaution/Restrictions Comments: R BKA w/ prosthetic Restrictions Weight Bearing Restrictions Per Provider Order: No      Mobility  Bed Mobility Overal bed mobility: Independent                  Transfers Overall transfer level: Needs assistance Equipment used: Rolling walker (2 wheels) Transfers: Sit to/from Stand Sit to Stand: Supervision           General transfer comment: for safety    Ambulation/Gait Ambulation/Gait  assistance: Supervision Gait Distance (Feet): 110 Feet Assistive device: Rolling walker (2 wheels) Gait Pattern/deviations: Step-through pattern, Decreased stride length, Trunk flexed       General Gait Details: pt reports pain in rt knee with ambulation; reports pain is what limits his distance.  Reports this has happened since his Nov hip surgery.  He states he was following up and trying to get a new prosthetic from [employer]     Tilt Bed    Modified Rankin (Stroke Patients Only)       Balance                                             Pertinent Vitals/Pain Pain Assessment Faces Pain Scale: Hurts little more Pain Location: R knee and back Pain Descriptors / Indicators: Aching, Sore Pain Intervention(s): Limited activity within patient's tolerance, Monitored during session, Repositioned    Home Living Family/patient expects to be discharged to:: Private residence Living Arrangements: Parent Available Help at Discharge: Family;Available 24 hours/day Type of Home: House Home Access: Stairs to enter Entrance Stairs-Rails: Left;Right;Can reach both Entrance Stairs-Number of Steps: 3   Home Layout: One level Home Equipment: Wheelchair - Forensic psychologist (2 wheels);Grab bars - tub/shower;Hand held shower head;Cane - quad;BSC/3in1 Additional Comments: Walked better up until surgery in November    Prior Function Prior  Level of Function : Needs assist             Mobility Comments: RW and w/c at home, was doing HH up until 3 weeks ago for his knee ADLs Comments: ind, utilizes transportation services to get to HD     Extremity/Trunk Assessment   Upper Extremity Assessment Upper Extremity Assessment: Defer to OT evaluation    Lower Extremity Assessment Lower Extremity Assessment: Overall WFL for tasks assessed RLE Deficits / Details: R BKA w/prosthetic    Cervical / Trunk Assessment Cervical /  Trunk Assessment: Normal  Communication   Communication Communication: No apparent difficulties    Cognition Arousal: Alert Behavior During Therapy: Flat affect                             Following commands: Intact       Cueing       General Comments      Exercises     Assessment/Plan    PT Assessment Patient needs continued PT services  PT Problem List Decreased strength;Decreased activity tolerance;Decreased balance;Decreased mobility;Pain       PT Treatment Interventions DME instruction;Gait training;Stair training;Functional mobility training;Therapeutic activities;Therapeutic exercise;Patient/family education    PT Goals (Current goals can be found in the Care Plan section)  Acute Rehab PT Goals Patient Stated Goal: improve his walking PT Goal Formulation: With patient Time For Goal Achievement: 02/08/24 Potential to Achieve Goals: Good    Frequency Min 1X/week     Co-evaluation               AM-PAC PT "6 Clicks" Mobility  Outcome Measure Help needed turning from your back to your side while in a flat bed without using bedrails?: None Help needed moving from lying on your back to sitting on the side of a flat bed without using bedrails?: None Help needed moving to and from a bed to a chair (including a wheelchair)?: A Little Help needed standing up from a chair using your arms (e.g., wheelchair or bedside chair)?: A Little Help needed to walk in hospital room?: A Little Help needed climbing 3-5 steps with a railing? : A Little 6 Click Score: 20    End of Session Equipment Utilized During Treatment: Gait belt Activity Tolerance: Patient limited by pain Patient left: with call bell/phone within reach (sitting EOB) Nurse Communication: Mobility status PT Visit Diagnosis: Other abnormalities of gait and mobility (R26.89);Pain Pain - Right/Left: Right Pain - part of body: Knee    Time: 1111-1130 PT Time Calculation (min) (ACUTE  ONLY): 19 min   Charges:   PT Evaluation $PT Eval Low Complexity: 1 Low   PT General Charges $$ ACUTE PT VISIT: 1 Visit         Carime Dinkel M,PT Acute Rehab Services [phone]   Bevelyn Buckles 01/25/2024, 12:00 PM

## 2024-01-25 NOTE — Plan of Care (Signed)
   Problem: Education: Goal: Ability to describe self-care measures that may prevent or decrease complications (Diabetes Survival Skills Education) will improve Outcome: Completed/Met

## 2024-01-25 NOTE — Evaluation (Addendum)
 Occupational Therapy Evaluation Patient Details Name: [name] [name] MRN: [id number] DOB: 1970/12/22 Today's Date: 01/25/2024   History of Present Illness   Pt is a 53 y/o M presenting to ED on 3/1 from HD center with uncontrolled HTN and intractable n/v. CT suggestive of colitis. PMH includes DM2, ESRD on HD TTS, R BKA, GERD, HLD, MDD, HTN     Clinical Impressions Pt reports ind at baseline with ADLs and uses RW/wheelchair with functional mobility. Pt lives with mother. Pt currently needing supervision for ADLs, ind with bed mobility and supervision for transfers with RW. Pt reports feeling slightly weaker than baseline. Pt reports his shower seat at home is broken, discussed shower seat vs tub bench,and pt prefers shower seat for home.  Pt presenting with impairments listed below, will follow acutely. Anticipate no OT follow up needs at d/c.     If plan is discharge home, recommend the following:   A little help with walking and/or transfers;A little help with bathing/dressing/bathroom;Assistance with cooking/housework;Help with stairs or ramp for entrance;Assist for transportation;Direct supervision/assist for medications management;Direct supervision/assist for financial management     Functional Status Assessment   Patient has had a recent decline in their functional status and demonstrates the ability to make significant improvements in function in a reasonable and predictable amount of time.     Equipment Recommendations   Tub/shower seat     Recommendations for Other Services   PT consult     Precautions/Restrictions   Precautions Precaution/Restrictions Comments: R BKA w/ prosthetic Restrictions Weight Bearing Restrictions Per Provider Order: No     Mobility Bed Mobility Overal bed mobility: Independent                  Transfers Overall transfer level: Needs assistance Equipment used: Rolling walker (2 wheels) Transfers: Sit to/from  Stand Sit to Stand: Supervision                  Balance Overall balance assessment: Needs assistance Sitting-balance support: No upper extremity supported, Feet supported Sitting balance-Leahy Scale: Normal     Standing balance support: Bilateral upper extremity supported, During functional activity Standing balance-Leahy Scale: Poor Standing balance comment: reliant on RW                           ADL either performed or assessed with clinical judgement   ADL Overall ADL's : Needs assistance/impaired Eating/Feeding: Independent   Grooming: Supervision/safety;Standing   Upper Body Bathing: Supervision/ safety;Sitting   Lower Body Bathing: Supervison/ safety   Upper Body Dressing : Supervision/safety   Lower Body Dressing: Supervision/safety Lower Body Dressing Details (indicate cue type and reason): dons prosthesis without assist Toilet Transfer: Supervision/safety;Ambulation;Regular Toilet;Rolling walker (2 wheels)   Toileting- Clothing Manipulation and Hygiene: Supervision/safety       Functional mobility during ADLs: Supervision/safety;Rolling walker (2 wheels)       Vision   Vision Assessment?: No apparent visual deficits     Perception Perception: Not tested       Praxis Praxis: Not tested       Pertinent Vitals/Pain Pain Assessment Pain Assessment: No/denies pain     Extremity/Trunk Assessment Upper Extremity Assessment Upper Extremity Assessment: Overall WFL for tasks assessed   Lower Extremity Assessment RLE Deficits / Details: R BKA w/prosthetic   Cervical / Trunk Assessment Cervical / Trunk Assessment: Normal   Communication Communication Communication: No apparent difficulties   Cognition Arousal: Alert Behavior During Therapy: Flat affect  Cognition: No apparent impairments                               Following commands: Intact       Cueing  General Comments      VSS on RA   Exercises      Shoulder Instructions      Home Living Family/patient expects to be discharged to:: Private residence Living Arrangements: Parent Available Help at Discharge: Family;Available 24 hours/day Type of Home: House Home Access: Stairs to enter [employer] of Steps: 3 Entrance Stairs-Rails: Left;Right;Can reach both Home Layout: One level     Bathroom Shower/Tub: Chief Strategy Officer: Handicapped height Bathroom Accessibility: No   Home Equipment: Wheelchair - Forensic psychologist (2 wheels);Grab bars - tub/shower;Hand held shower head;Cane - quad;BSC/3in1          Prior Functioning/Environment Prior Level of Function : Needs assist             Mobility Comments: RW and w/c at home, was doing HH up until 3 weeks ago for his knee ADLs Comments: ind, utilizes transportation services to get to HD    OT Problem List: Decreased strength;Decreased activity tolerance;Impaired balance (sitting and/or standing);Decreased safety awareness;Decreased knowledge of use of DME or AE   OT Treatment/Interventions: Self-care/ADL training;Therapeutic exercise;DME and/or AE instruction;Patient/family education;Balance training;Therapeutic activities;Cognitive remediation/compensation      OT Goals(Current goals can be found in the care plan section)   Acute Rehab OT Goals Patient Stated Goal: none stated OT Goal Formulation: With patient Time For Goal Achievement: 02/08/24 Potential to Achieve Goals: Good ADL Goals Pt Will Perform Tub/Shower Transfer: Tub transfer;Shower transfer;shower seat;ambulating;rolling walker;with supervision Additional ADL Goal #1: pt will tolerate OOB activity x10 min in order to improve activity tolerance for ADLs   OT Frequency:  Min 1X/week    Co-evaluation              AM-PAC OT "6 Clicks" Daily Activity     Outcome Measure Help from another person eating meals?: None Help from another person taking care of personal  grooming?: A Little Help from another person toileting, which includes using toliet, bedpan, or urinal?: A Little Help from another person bathing (including washing, rinsing, drying)?: A Little Help from another person to put on and taking off regular upper body clothing?: A Little Help from another person to put on and taking off regular lower body clothing?: A Little 6 Click Score: 19   End of Session Equipment Utilized During Treatment: Rolling walker (2 wheels) Nurse Communication: Mobility status  Activity Tolerance: Patient tolerated treatment well Patient left: in bed;with call bell/phone within reach; with bed alarm set  OT Visit Diagnosis: Unsteadiness on feet (R26.81);Muscle weakness (generalized) (M62.81);Other symptoms and signs involving cognitive function                Time: 0454-0981 OT Time Calculation (min): 17 min Charges:  OT General Charges $OT Visit: 1 Visit OT Evaluation $OT Eval Low Complexity: 1 Low  Jaleeyah Munce K, OTD, OTR/L SecureChat Preferred Acute Rehab [phone]   Stefani Baik K Koonce 01/25/2024, 9:01 AM

## 2024-01-25 NOTE — Assessment & Plan Note (Addendum)
 Chronic issue with some colitis noted on CTAP.  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 - Tigan 200 mg IM Q8h PRN - Pantoprazole 40 mg PO - Advanced diet to solids, no issues - Consult GI, appreciate recs:  -Holding off on CTA or MRA, ischemic colitis unlikely  -Outpatient surveillance colonoscopy when able  -Probable MRI in 6 months for pancreatic lesions surveillance  -Outpatient follow up to be scheduled after discharge

## 2024-01-25 NOTE — Assessment & Plan Note (Signed)
 Placement planned for 3/5 AM per Vascular.  Will keep patient hospitalized on FMTS overnight. -Hold heparin at midnight -NPO at midnight

## 2024-01-25 NOTE — Assessment & Plan Note (Signed)
>>  ASSESSMENT AND PLAN FOR T2DM (TYPE 2 DIABETES MELLITUS) (HCC) WRITTEN ON 01/25/2024  2:57 PM BY TOMA MATAS, MD  Last A1c 6.1 in November 2024.  CBGs appropriate, only 2 units SAI daily. - vsSSI - No LAI - CBGs ACHS

## 2024-01-25 NOTE — Assessment & Plan Note (Signed)
 HD TTS schedule.  Last HD was 3/1. - Nephrology consulted

## 2024-01-25 NOTE — Telephone Encounter (Signed)
-----   Message from Willette Cluster sent at 01/25/2024  2:30 PM EST ----- Hi,   Can you get this patient a hospital follow up with Dr. Myrtie Neither in about 4 weeks. If no opening then can schedule with one of the apps. You should probably wait a couple of days to call him because I don't know when he will be discharged.   Thanks

## 2024-01-25 NOTE — Assessment & Plan Note (Addendum)
 BP gradually improving but still elevated overnight, poorly controlled.  Home medication includes metoprolol 25 mg daily, carvedilol 12.5 mg BID, hydralazine 50 mg BID and diltiazem XR 300 mg daily. - Continue home hydralazine 100 mg BID and return to metoprolol succinate 25 mg daily - Start losartan 25 mg - Continue diltiazem 300 mg daily - Monitor with routine vitals

## 2024-01-25 NOTE — Procedures (Signed)
 I was present at the procedure, reviewed the HD regimen and made appropriate changes.   Vinson Moselle MD  [facility] 01/25/2024, 4:22 PM

## 2024-01-25 NOTE — Assessment & Plan Note (Addendum)
 Patient doing much better today.  Unlikely effect of 1 day of fluoxetine, however encouraging to see. -Resume fluoxetine 20 mg daily

## 2024-01-25 NOTE — TOC CM/SW Note (Addendum)
 Transition of Care [facility]) - Inpatient Brief Assessment   Patient Details  Name: [name] [name] MRN: [id number] Date of Birth: Nov 15, 1971  Transition of Care [facility]) CM/SW Contact:    Tom-Johnson, Hershal Coria, RN Phone Number: 01/25/2024, 2:17 PM   Clinical Narrative:  Patient presents to the ED with N/V, Weakness and Abdominal pain. GI following.  Patient with hx of ESRD, goes to outpatient HD on a TTS schedule. Uses [employer] and Medicaid transportation to and from HD. Nephrology following for inpatient HD.  From home with his mother and sister. Has a supportive daughter.  Has all necessary DME's at home. Has Rt BKA prosthesis. Requested a new shower seat as the one he has at home is broken. Ordered from [employer] and Earna Coder to deliver to patient at discharge.  PCP is [name], MD and uses [facility] on [address]   No PT/OT f/u noted. Patient not Medically ready for discharge.  CM will continue to follow as patient progresses with care towards discharge.           Transition of Care Asessment: Insurance and Status: Insurance coverage has been reviewed Patient has primary care physician: Yes Home environment has been reviewed: Yes Prior level of function:: Modified Independent Prior/Current Home Services: No current home services Social Drivers of Health Review: SDOH reviewed no interventions necessary Readmission risk has been reviewed: Yes Transition of care needs: transition of care needs identified, TOC will continue to follow

## 2024-01-25 NOTE — Procedures (Signed)
 Received patient in bed to unit.  Alert and oriented.  Informed consent signed and in chart.   TX duration: 3.5 hours  Patient tolerated well.  Transported back to the room  Alert, without acute distress.  Hand-off given to patient's nurse.   Access used: right cath Access issues: none  Total UF removed: 0 per MD order Medication(s) given: none  Lu Duffel, RN [facility]

## 2024-01-25 NOTE — Assessment & Plan Note (Addendum)
 Resolved

## 2024-01-25 NOTE — Progress Notes (Addendum)
 Daily Progress Note  DOA: 01/22/2024 Hospital Day: 4   Chief Complaint: nausea / vomiting and colitis on CT scan    Attending physician's note   I have taken a history, reviewed the chart and examined the patient. I performed a substantive portion of this encounter, including complete performance of at least one of the key components, in conjunction with the APP. I agree with the APP's note, impression and recommendations.    Nausea and vomiting has improved.  Colitis on CT but he doesn't have any diarrhea or abdominal pain F/u in GI office, should MRCP in 6 months for f/u of pancreas IPMN and also to discuss surveillance colonoscopy  Gi will sign off but available if have any questions  K. Scherry Ran , MD [phone]     ASSESSMENT    53 yo male with nausea / vomiting and colitis on CT scan .  May have been viral gastroenteritis or gastroparesis or related to hypertensive crisis . Regarding colitis on CT scan, he doesn't have any diarrhea, abdominal pain, blood in stool or other symptoms generally associated with colitis. Could have been ischemic in nature though distribution of colitis makes it seem less likely.   Pancreatic lesion, ? 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   History of polyps.  Last colonoscopy 2021 with  3 diminutive polyps removed which were tubular adenomas    ESRD on HD   PLAN   --Holding off on CTA or MRA for now.Seems unlikely to have had ischemic colitis.  --Outpatient surveillance colonoscopy when able  --Probable  MRI in 6 months to follow up on pancreatic lesion --Our office will contact him to arrange for hospital follow up .   Subjective   No N/V today. Tolerating diet. No BM today. Feels okay   Objective    Recent Labs    01/23/24 0516 01/24/24 0323 01/25/24 0333  WBC 9.0 8.8 7.8  HGB 11.4* 10.2* 11.1*  HCT 37.1* 32.8* 35.2*  PLT 119* 111* 105*   BMET Recent Labs    01/23/24 0516 01/24/24 0323 01/25/24 0333  NA 139  137 135  K 3.4* 3.5 3.9  CL 97* 94* 95*  CO2 30 30 28   GLUCOSE 200* 226* 174*  BUN 17 25* 32*  CREATININE 4.90* 6.65* 8.17*  CALCIUM 8.1* 7.6* 7.0*   LFT Recent Labs    01/23/24 0516  PROT 5.4*  ALBUMIN 2.6*  AST 13*  ALT 9  ALKPHOS 81  BILITOT 0.5   PT/INR No results for input(s): "LABPROT", "INR" in the last 72 hours.   Imaging:  CT ABDOMEN PELVIS WO CONTRAST CLINICAL DATA:  Abdominal pain, bowel obstruction suspected, vomiting.  EXAM: CT ABDOMEN AND PELVIS WITHOUT CONTRAST  TECHNIQUE: Multidetector CT imaging of the abdomen and pelvis was performed following the standard protocol without IV contrast.  RADIATION DOSE REDUCTION: This exam was performed according to the departmental dose-optimization program which includes automated exposure control, adjustment of the mA and/or kV according to patient size and/or use of iterative reconstruction technique.  COMPARISON:  MRI 12/01/2023 and CT abdomen and pelvis 11/30/2023  FINDINGS: Lower chest: No acute abnormality.  Hepatobiliary: Unremarkable liver. Cholelithiasis. No evidence of acute cholecystitis. No biliary dilation.  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  Spleen: Unremarkable.  Adrenals/Urinary Tract: Stable adrenal glands including left adrenal adenoma. No follow-up recommended. No urinary calculi or hydronephrosis. Bladder is unremarkable.  Stomach/Bowel: Normal  caliber large and small bowel. Mild wall thickening with pericolonic fat stranding about the ascending colon. Additional mild wall thickening of the transverse and descending colon. The appendix is normal.Stomach is within normal limits.  Vascular/Lymphatic: Aortic atherosclerosis. No enlarged abdominal or pelvic lymph nodes.  Reproductive: Unremarkable.  Other: No free intraperitoneal fluid or air.  Body wall edema.  Musculoskeletal: No acute  fracture. Findings rod and screw fixation across a right intertrochanteric fracture. Fracture line remains visible.  IMPRESSION: 1. Nonspecific colitis of the ascending, transverse and descending colon. 2. Cholelithiasis without evidence of acute cholecystitis. 3. Body wall edema. 4. Aortic Atherosclerosis (ICD10-I70.0).  Electronically Signed   By: Minerva Fester M.D.   On: 01/22/2024 22:15 DG Chest Port 1 View CLINICAL DATA:  Chest pain  EXAM: PORTABLE CHEST 1 VIEW  COMPARISON:  11/30/2023  FINDINGS: Right-sided central venous catheter tip at the cavoatrial junction. Cardiomegaly. No acute airspace disease, pleural effusion or pneumothorax  IMPRESSION: No active disease.  Cardiomegaly  Electronically Signed   By: Jasmine Pang M.D.   On: 01/22/2024 20:44     Scheduled inpatient medications:   Chlorhexidine Gluconate Cloth  6 each Topical Q0600   diltiazem  300 mg Oral Daily   feeding supplement (NEPRO CARB STEADY)  237 mL Oral BID BM   FLUoxetine  20 mg Oral Daily   [START ON 01/26/2024] heparin  2,000 Units Dialysis Once in dialysis   heparin  5,000 Units Subcutaneous Q8H   heparin sodium (porcine)  2,000 Units Intravenous Once   hydrALAZINE  100 mg Oral BID   insulin aspart  0-6 Units Subcutaneous TID WC   losartan  25 mg Oral Daily   [START ON 01/26/2024] metoprolol succinate  25 mg Oral Daily   metoprolol tartrate  12.5 mg Oral BID   pantoprazole  40 mg Oral Daily   sodium chloride flush  10-40 mL Intracatheter Q12H   Continuous inpatient infusions:   anticoagulant sodium citrate     PRN inpatient medications: acetaminophen, alteplase, anticoagulant sodium citrate, feeding supplement (NEPRO CARB STEADY), heparin, [START ON 01/26/2024] heparin, lidocaine (PF), lidocaine-prilocaine, pentafluoroprop-tetrafluoroeth, sodium chloride flush, trimethobenzamide  Vital signs in last 24 hours: Temp:  [98.1 F (36.7 C)-98.7 F (37.1 C)] 98.3 F (36.8 C) (03/04  0500) Pulse Rate:  [71-79] 79 (03/04 0808) Resp:  [18] 18 (03/04 1344) BP: (158-173)/(72-91) 165/72 (03/04 0808) SpO2:  [96 %-98 %] 96 % (03/04 0808) Last BM Date : 01/21/24  Intake/Output Summary (Last 24 hours) at 01/25/2024 1358 Last data filed at 01/25/2024 0800 Gross per 24 hour  Intake 490 ml  Output --  Net 490 ml    Intake/Output from previous day: 03/03 0701 - 03/04 0700 In: 10 [I.V.:10] Out: -  Intake/Output this shift: Total I/O In: 480 [P.O.:480] Out: -    Physical Exam:  General: Alert male in NAD Heart:  Regular rate .  Pulmonary: Normal respiratory effort Abdomen: Soft, nondistended, nontender. Normal bowel sounds. Neurologic: Alert and oriented Psych: Pleasant. Cooperative. Insight appears normal.     LOS: 2 days   Willette Cluster ,NP 01/25/2024, 1:58 PM

## 2024-01-25 NOTE — Progress Notes (Signed)
 Daily Progress Note Intern Pager: [phone]  Patient name: [name] [name] Medical record number: [id number] Date of birth: 1971-03-28 Age: 53 y.o. Gender: male  Primary Care Provider: [name], MD Consultants: GI Code Status: FULL  Pt Overview and Major Events to Date:  3/1: Admitted 3/2: GI consulted  3/3: Vomiting resolved, diet advancing 3/5: AV fistula creation planned   Assessment and Plan: DARRIS STAIGER is a 53 y.o. male with a pertinent PMH of T2DM, PAD, ESRD on HD (TTS), R BKA, GERD, HLD, MDD, and HTN who presented with uncontrolled hypertension and intractable nausea/vomiting.  Patient to receive HD [facility] today and will undergo AV fistula creation tomorrow, then be cleared to discharge home if [facility] amenable.  PT recommending outpatient PT.  Nausea and vomiting resolved.  GI will schedule outpatient follow up and signing off. Assessment & Plan Intractable vomiting Chronic issue with some colitis noted on CTAP.  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 - Tigan 200 mg IM Q8h PRN - Pantoprazole 40 mg PO - Advanced diet to solids, no issues - Consult GI, appreciate recs:  -Holding off on CTA or MRA, ischemic colitis unlikely  -Outpatient surveillance colonoscopy when able  -Probable MRI in 6 months for pancreatic lesions surveillance  -Outpatient follow up to be scheduled after discharge Severe hypertension BP gradually improving but still elevated overnight, poorly controlled.  Home medication includes metoprolol 25 mg daily, carvedilol 12.5 mg BID, hydralazine 50 mg BID and diltiazem XR 300 mg daily. - Continue home hydralazine 100 mg BID and return to metoprolol succinate 25 mg daily - Start losartan 25 mg - Continue diltiazem 300 mg daily - Monitor with routine vitals A-V fistula (HCC) Placement planned for 3/5 AM per Vascular.  Will keep patient hospitalized on FMTS  overnight. -Hold heparin at midnight -NPO at midnight Depression Patient doing much better today.  Unlikely effect of 1 day of fluoxetine, however encouraging to see. -Resume fluoxetine 20 mg daily ESRD (end stage renal disease) on dialysis (HCC) HD TTS schedule.  Last HD was 3/1. - Nephrology consulted T2DM (type 2 diabetes mellitus) (HCC) Last A1c 6.1 in November 2024.  CBGs appropriate, only 2 units SAI daily. - vsSSI - No LAI - CBGs ACHS Abdominal pain (Resolved: 01/25/2024) CTAP showed nonspecific colitis, which could be from viral gastroenteritis.  Pain now resolved. Hiccups (Resolved: 01/25/2024) Continue to monitor, can treat if return. Chest pain (Resolved: 01/25/2024) Resolved.  Chronic and Stable Problems: Neuropathy: Restart home gabapentin when able to tolerate PO   FEN/GI: Soft diet, advancing to solids PPx: Heparin Dispo: Home once after AV fistula creation with OT follow up.  Subjective:  Patient is doing well this morning.  He is upbeat and ambulating around room. Would like to remain inpatient until tomorrow given planned AV fistula creation surgery. Called [facility] and they are in agreement that this would be preferred course of action.  Objective: Temp:  [98.1 F (36.7 C)-98.7 F (37.1 C)] 98.3 F (36.8 C) (03/04 0500) Pulse Rate:  [68-79] 79 (03/04 0808) Resp:  [18] 18 (03/03 2039) BP: (158-174)/(72-91) 165/72 (03/04 0808) SpO2:  [95 %-98 %] 96 % (03/04 6295)  Physical Exam: General: Age-appropriate, resting comfortably in bed, NAD, alert and at baseline. Cardiovascular: Regular rate and rhythm. Normal S1/S2. No murmurs, rubs, or gallops appreciated. 2+ radial pulses. Pulmonary: Clear bilaterally to ascultation. No increased WOB, no accessory muscle usage on room air.  No wheezes, crackles, or rhonchi. Abdominal: Normoactive bowel sounds, nondistended. No tenderness to deep or light palpation. No rebound or guarding. Extremities: R BKA with prosthesis. No  peripheral edema bilaterally. Capillary refill <2 seconds. Psych: Bright mood, full range affect, pleasant, appropriate, ambulating around room with energy.  Laboratory: Most recent CBC Lab Results  Component Value Date   WBC 7.8 01/25/2024   HGB 11.1 (L) 01/25/2024   HCT 35.2 (L) 01/25/2024   MCV 84.2 01/25/2024   PLT 105 (L) 01/25/2024   Most recent BMP    Latest Ref Rng & Units 01/25/2024    3:33 AM  BMP  Glucose 70 - 99 mg/dL 161   BUN 6 - 20 mg/dL 32   Creatinine 0.96 - 1.24 mg/dL 0.45   Sodium 409 - 811 mmol/L 135   Potassium 3.5 - 5.1 mmol/L 3.9   Chloride 98 - 111 mmol/L 95   CO2 22 - 32 mmol/L 28   Calcium 8.9 - 10.3 mg/dL 7.0     Other pertinent labs: -None  New Imaging/Diagnostic Tests: -None  Blair Lundeen, MD 01/25/2024, 8:14 AM  PGY-1, [facility] FPTS Intern pager: [phone], text pages welcome Secure chat group [facility] Teaching Service

## 2024-01-26 ENCOUNTER — Encounter (HOSPITAL_COMMUNITY)
Admission: EM | Disposition: A | Discharge status: Home / Self Care | Payer: Self-pay | Source: Home / Self Care | Attending: Family Medicine

## 2024-01-26 ENCOUNTER — Inpatient Hospital Stay (HOSPITAL_COMMUNITY): Admitting: Registered Nurse

## 2024-01-26 ENCOUNTER — Other Ambulatory Visit (HOSPITAL_COMMUNITY): Payer: Self-pay

## 2024-01-26 ENCOUNTER — Encounter (HOSPITAL_COMMUNITY): Payer: Self-pay | Admitting: Family Medicine

## 2024-01-26 ENCOUNTER — Ambulatory Visit (HOSPITAL_COMMUNITY): Admission: RE | Admit: 2024-01-26 | Payer: 59 | Source: Ambulatory Visit | Admitting: Vascular Surgery

## 2024-01-26 ENCOUNTER — Other Ambulatory Visit: Payer: Self-pay

## 2024-01-26 DIAGNOSIS — Z992 Dependence on renal dialysis: Secondary | ICD-10-CM

## 2024-01-26 DIAGNOSIS — F17210 Nicotine dependence, cigarettes, uncomplicated: Secondary | ICD-10-CM | POA: Diagnosis not present

## 2024-01-26 DIAGNOSIS — I120 Hypertensive chronic kidney disease with stage 5 chronic kidney disease or end stage renal disease: Secondary | ICD-10-CM

## 2024-01-26 DIAGNOSIS — N186 End stage renal disease: Secondary | ICD-10-CM

## 2024-01-26 DIAGNOSIS — R112 Nausea with vomiting, unspecified: Secondary | ICD-10-CM | POA: Diagnosis not present

## 2024-01-26 DIAGNOSIS — R1110 Vomiting, unspecified: Secondary | ICD-10-CM | POA: Diagnosis not present

## 2024-01-26 DIAGNOSIS — I160 Hypertensive urgency: Secondary | ICD-10-CM | POA: Diagnosis not present

## 2024-01-26 DIAGNOSIS — N185 Chronic kidney disease, stage 5: Secondary | ICD-10-CM | POA: Diagnosis not present

## 2024-01-26 HISTORY — DX: Supraventricular tachycardia, unspecified: I47.10

## 2024-01-26 HISTORY — DX: Dyspnea, unspecified: R06.00

## 2024-01-26 HISTORY — PX: AV FISTULA PLACEMENT: SHX1204

## 2024-01-26 LAB — CBC
HCT: 32.9 % — ABNORMAL LOW (ref 39.0–52.0)
Hemoglobin: 10.5 g/dL — ABNORMAL LOW (ref 13.0–17.0)
MCH: 26.5 pg (ref 26.0–34.0)
MCHC: 31.9 g/dL (ref 30.0–36.0)
MCV: 83.1 fL (ref 80.0–100.0)
Platelets: 97 10*3/uL — ABNORMAL LOW (ref 150–400)
RBC: 3.96 MIL/uL — ABNORMAL LOW (ref 4.22–5.81)
RDW: 18.6 % — ABNORMAL HIGH (ref 11.5–15.5)
WBC: 8.1 10*3/uL (ref 4.0–10.5)
nRBC: 0 % (ref 0.0–0.2)

## 2024-01-26 LAB — GLUCOSE, CAPILLARY
Glucose-Capillary: 122 mg/dL — ABNORMAL HIGH (ref 70–99)
Glucose-Capillary: 120 mg/dL — ABNORMAL HIGH (ref 70–99)
Glucose-Capillary: 139 mg/dL — ABNORMAL HIGH (ref 70–99)

## 2024-01-26 LAB — BASIC METABOLIC PANEL
Anion gap: 8 (ref 5–15)
BUN: 18 mg/dL (ref 6–20)
CO2: 28 mmol/L (ref 22–32)
Calcium: 6.8 mg/dL — ABNORMAL LOW (ref 8.9–10.3)
Chloride: 98 mmol/L (ref 98–111)
Creatinine, Ser: 5.64 mg/dL — ABNORMAL HIGH (ref 0.61–1.24)
GFR, Estimated: 11 mL/min — ABNORMAL LOW (ref >60)
Glucose, Bld: 110 mg/dL — ABNORMAL HIGH (ref 70–99)
Potassium: 4 mmol/L (ref 3.5–5.1)
Sodium: 134 mmol/L — ABNORMAL LOW (ref 135–145)

## 2024-01-26 LAB — HEPATITIS B SURFACE ANTIBODY, QUANTITATIVE: Hep B S AB Quant (Post): <3.5 m[IU]/mL — ABNORMAL LOW

## 2024-01-26 SURGERY — ARTERIOVENOUS (AV) FISTULA CREATION
Anesthesia: General | Site: Arm Upper | Laterality: Left

## 2024-01-26 MED ORDER — HEPARIN SODIUM (PORCINE) 1000 UNIT/ML IJ SOLN
INTRAMUSCULAR | Status: DC | PRN
  Administered 2024-01-26: 3000 [IU] via INTRAVENOUS

## 2024-01-26 MED ORDER — PHENYLEPHRINE HCL-NACL 20-0.9 MG/250ML-% IV SOLN
INTRAVENOUS | Status: DC | PRN
  Administered 2024-01-26: 20 ug/min via INTRAVENOUS

## 2024-01-26 MED ORDER — ACETAMINOPHEN 325 MG PO TABS: 325.0000 mg | ORAL_TABLET | ORAL | Status: DC | PRN

## 2024-01-26 MED ORDER — ORAL CARE MOUTH RINSE: 15.0000 mL | OROMUCOSAL | Status: DC | PRN

## 2024-01-26 MED ORDER — FENTANYL CITRATE (PF) 100 MCG/2ML IJ SOLN
25.0000 ug | INTRAMUSCULAR | Status: DC | PRN
  Administered 2024-01-26 (×2): 50 ug via INTRAVENOUS

## 2024-01-26 MED ORDER — FENTANYL CITRATE (PF) 250 MCG/5ML IJ SOLN
INTRAMUSCULAR | Status: AC
  Filled 2024-01-26: qty 5

## 2024-01-26 MED ORDER — ACETAMINOPHEN 160 MG/5ML PO SOLN: 325.0000 mg | ORAL | Status: DC | PRN

## 2024-01-26 MED ORDER — DEXAMETHASONE SODIUM PHOSPHATE 10 MG/ML IJ SOLN
INTRAMUSCULAR | Status: DC | PRN
  Administered 2024-01-26: 5 mg via INTRAVENOUS

## 2024-01-26 MED ORDER — HYDRALAZINE HCL 20 MG/ML IJ SOLN
INTRAMUSCULAR | Status: DC | PRN
  Administered 2024-01-26: 5 mg via INTRAVENOUS

## 2024-01-26 MED ORDER — HEPARIN 6000 UNIT IRRIGATION SOLUTION
Status: DC | PRN
  Administered 2024-01-26: 1

## 2024-01-26 MED ORDER — PROPOFOL 10 MG/ML IV BOLUS
INTRAVENOUS | Status: DC | PRN
  Administered 2024-01-26: 150 mg via INTRAVENOUS

## 2024-01-26 MED ORDER — LIDOCAINE 2% (20 MG/ML) 5 ML SYRINGE
INTRAMUSCULAR | Status: DC | PRN
  Administered 2024-01-26: 40 mg via INTRAVENOUS

## 2024-01-26 MED ORDER — SODIUM CHLORIDE 0.9 % IV SOLN: INTRAVENOUS | Status: DC

## 2024-01-26 MED ORDER — ACETAMINOPHEN 10 MG/ML IV SOLN: 1000.0000 mg | Freq: Once | INTRAVENOUS | Status: DC | PRN

## 2024-01-26 MED ORDER — ONDANSETRON HCL 4 MG/2ML IJ SOLN
INTRAMUSCULAR | Status: DC | PRN
  Administered 2024-01-26: 4 mg via INTRAVENOUS

## 2024-01-26 MED ORDER — HEMOSTATIC AGENTS (NO CHARGE) OPTIME
TOPICAL | Status: DC | PRN
  Administered 2024-01-26: 1 via TOPICAL

## 2024-01-26 MED ORDER — FENTANYL CITRATE (PF) 100 MCG/2ML IJ SOLN
INTRAMUSCULAR | Status: AC
  Filled 2024-01-26: qty 2

## 2024-01-26 MED ORDER — ORAL CARE MOUTH RINSE: 15.0000 mL | Freq: Once | OROMUCOSAL | Status: AC

## 2024-01-26 MED ORDER — MIDAZOLAM HCL 2 MG/2ML IJ SOLN
INTRAMUSCULAR | Status: AC
  Filled 2024-01-26: qty 2

## 2024-01-26 MED ORDER — CHLORHEXIDINE GLUCONATE 0.12 % MT SOLN
15.0000 mL | Freq: Once | OROMUCOSAL | Status: AC
  Administered 2024-01-26: 15 mL via OROMUCOSAL
  Filled 2024-01-26: qty 15

## 2024-01-26 MED ORDER — LOSARTAN POTASSIUM 25 MG PO TABS
25.0000 mg | ORAL_TABLET | Freq: Every day | ORAL | 0 refills | Status: DC
  Filled 2024-01-26: qty 30, 30d supply, fill #0

## 2024-01-26 MED ORDER — INSULIN ASPART 100 UNIT/ML IJ SOLN: 0.0000 [IU] | INTRAMUSCULAR | Status: DC | PRN

## 2024-01-26 MED ORDER — 0.9 % SODIUM CHLORIDE (POUR BTL) OPTIME
TOPICAL | Status: DC | PRN
  Administered 2024-01-26: 1000 mL

## 2024-01-26 MED ORDER — HEPARIN 6000 UNIT IRRIGATION SOLUTION
Status: AC
  Filled 2024-01-26: qty 500

## 2024-01-26 MED ORDER — FENTANYL CITRATE (PF) 250 MCG/5ML IJ SOLN
INTRAMUSCULAR | Status: DC | PRN
  Administered 2024-01-26: 25 ug via INTRAVENOUS
  Administered 2024-01-26: 50 ug via INTRAVENOUS

## 2024-01-26 MED ORDER — PANTOPRAZOLE SODIUM 40 MG PO TBEC
40.0000 mg | DELAYED_RELEASE_TABLET | Freq: Every day | ORAL | 0 refills | Status: DC
  Filled 2024-01-26: qty 30, 30d supply, fill #0

## 2024-01-26 MED ORDER — HEPATITIS B VAC RECOMB ADJ 20 MCG/0.5ML IM SOSY
0.5000 mL | PREFILLED_SYRINGE | Freq: Once | INTRAMUSCULAR | Status: DC
  Filled 2024-01-26: qty 0.5

## 2024-01-26 SURGICAL SUPPLY — 28 items
ARMBAND PINK RESTRICT EXTREMIT (MISCELLANEOUS) ×1 IMPLANT
BAG COUNTER SPONGE SURGICOUNT (BAG) ×1 IMPLANT
CANISTER SUCT 3000ML PPV (MISCELLANEOUS) ×1 IMPLANT
CLIP TI MEDIUM 6 (CLIP) ×1 IMPLANT
CLIP TI WIDE RED SMALL 6 (CLIP) ×1 IMPLANT
COVER PROBE W GEL 5X96 (DRAPES) IMPLANT
DERMABOND ADVANCED .7 DNX12 (GAUZE/BANDAGES/DRESSINGS) ×1 IMPLANT
ELECT REM PT RETURN 9FT ADLT (ELECTROSURGICAL) ×1 IMPLANT
ELECTRODE REM PT RTRN 9FT ADLT (ELECTROSURGICAL) ×1 IMPLANT
GLOVE BIOGEL PI IND STRL 7.0 (GLOVE) ×1 IMPLANT
GOWN STRL REUS W/ TWL LRG LVL3 (GOWN DISPOSABLE) ×2 IMPLANT
GOWN STRL REUS W/ TWL XL LVL3 (GOWN DISPOSABLE) ×1 IMPLANT
HEMOSTAT SNOW SURGICEL 2X4 (HEMOSTASIS) IMPLANT
INSERT FOGARTY SM (MISCELLANEOUS) IMPLANT
KIT BASIN OR (CUSTOM PROCEDURE TRAY) ×1 IMPLANT
KIT TURNOVER KIT B (KITS) ×1 IMPLANT
NS IRRIG 1000ML POUR BTL (IV SOLUTION) ×1 IMPLANT
PACK CV ACCESS (CUSTOM PROCEDURE TRAY) ×1 IMPLANT
PAD ARMBOARD 7.5X6 YLW CONV (MISCELLANEOUS) ×2 IMPLANT
POWDER SURGICEL 3.0 GRAM (HEMOSTASIS) IMPLANT
SLING ARM FOAM STRAP LRG (SOFTGOODS) IMPLANT
SUT MNCRL AB 4-0 PS2 18 (SUTURE) ×1 IMPLANT
SUT PROLENE 6 0 BV (SUTURE) ×1 IMPLANT
SUT VIC AB 3-0 SH 27X BRD (SUTURE) ×1 IMPLANT
TOWEL GREEN STERILE (TOWEL DISPOSABLE) ×1 IMPLANT
UNDERPAD 30X36 HEAVY ABSORB (UNDERPADS AND DIAPERS) ×1 IMPLANT
VASCULAR TIE MINI RED 18IN STL (MISCELLANEOUS) IMPLANT
WATER STERILE IRR 1000ML POUR (IV SOLUTION) ×1 IMPLANT

## 2024-01-26 NOTE — Assessment & Plan Note (Deleted)
 Placement planned for 3/5 AM per Vascular.  Will keep patient hospitalized on FMTS overnight. -Hold heparin at midnight -NPO at midnight

## 2024-01-26 NOTE — Assessment & Plan Note (Deleted)
 Patient doing much better today.  Unlikely effect of 1 day of fluoxetine, however encouraging to see. -Resume fluoxetine 20 mg daily

## 2024-01-26 NOTE — Assessment & Plan Note (Deleted)
 HD TTS schedule.  Last HD was 3/4. - Nephrology consulted

## 2024-01-26 NOTE — Plan of Care (Signed)
  Problem: Education: Goal: Individualized Educational Video(s) Outcome: Progressing   Problem: Fluid Volume: Goal: Ability to maintain a balanced intake and output will improve Outcome: Progressing   Problem: Health Behavior/Discharge Planning: Goal: Ability to identify and utilize available resources and services will improve Outcome: Progressing Goal: Ability to manage health-related needs will improve Outcome: Progressing   Problem: Metabolic: Goal: Ability to maintain appropriate glucose levels will improve Outcome: Progressing   Problem: Nutritional: Goal: Maintenance of adequate nutrition will improve Outcome: Progressing Goal: Progress toward achieving an optimal weight will improve Outcome: Progressing   Problem: Skin Integrity: Goal: Risk for impaired skin integrity will decrease Outcome: Progressing   Problem: Tissue Perfusion: Goal: Adequacy of tissue perfusion will improve Outcome: Progressing   Problem: Education: Goal: Knowledge of General Education information will improve Description: Including pain rating scale, medication(s)/side effects and non-pharmacologic comfort measures Outcome: Progressing   Problem: Health Behavior/Discharge Planning: Goal: Ability to manage health-related needs will improve Outcome: Progressing   Problem: Clinical Measurements: Goal: Ability to maintain clinical measurements within normal limits will improve Outcome: Progressing Goal: Will remain free from infection Outcome: Progressing Goal: Diagnostic test results will improve Outcome: Progressing Goal: Respiratory complications will improve Outcome: Progressing Goal: Cardiovascular complication will be avoided Outcome: Progressing   Problem: Activity: Goal: Risk for activity intolerance will decrease Outcome: Progressing   Problem: Nutrition: Goal: Adequate nutrition will be maintained Outcome: Progressing   Problem: Coping: Goal: Level of anxiety will  decrease Outcome: Progressing   Problem: Elimination: Goal: Will not experience complications related to bowel motility Outcome: Progressing Goal: Will not experience complications related to urinary retention Outcome: Progressing   Problem: Pain Managment: Goal: General experience of comfort will improve and/or be controlled Outcome: Progressing   Problem: Safety: Goal: Ability to remain free from injury will improve Outcome: Progressing   Problem: Skin Integrity: Goal: Risk for impaired skin integrity will decrease Outcome: Progressing

## 2024-01-26 NOTE — Assessment & Plan Note (Deleted)
 BP gradually improving but still elevated overnight, poorly controlled.  Home medication includes metoprolol 25 mg daily, carvedilol 12.5 mg BID, hydralazine 50 mg BID and diltiazem XR 300 mg daily. - Continue home hydralazine 100 mg BID and return to metoprolol succinate 25 mg daily - Start losartan 25 mg - Continue diltiazem 300 mg daily - Monitor with routine vitals

## 2024-01-26 NOTE — Anesthesia Preprocedure Evaluation (Addendum)
 Anesthesia Evaluation  Patient identified by MRN, date of birth, ID band Patient awake    Reviewed: Allergy & Precautions, NPO status , Patient's Chart, lab work & pertinent test results  Airway Mallampati: II  TM Distance: >3 FB Neck ROM: Full    Dental  (+) Teeth Intact, Dental Advisory Given   Pulmonary Current Smoker and Patient abstained from smoking.   breath sounds clear to auscultation       Cardiovascular hypertension, + Peripheral Vascular Disease   Rhythm:Regular Rate:Normal     Neuro/Psych  PSYCHIATRIC DISORDERS  Depression     Neuromuscular disease    GI/Hepatic Neg liver ROS,GERD  ,,  Endo/Other  diabetes    Renal/GU ESRF and DialysisRenal disease     Musculoskeletal  (+) Arthritis ,    Abdominal   Peds  Hematology  (+) Blood dyscrasia, anemia   Anesthesia Other Findings   Reproductive/Obstetrics                             Anesthesia Physical Anesthesia Plan  ASA: 3  Anesthesia Plan: General   Post-op Pain Management: Tylenol PO (pre-op)*   Induction: Intravenous  PONV Risk Score and Plan: 2 and Ondansetron and Midazolam  Airway Management Planned: LMA  Additional Equipment: None  Intra-op Plan:   Post-operative Plan: Extubation in OR  Informed Consent: I have reviewed the patients History and Physical, chart, labs and discussed the procedure including the risks, benefits and alternatives for the proposed anesthesia with the patient or authorized representative who has indicated his/her understanding and acceptance.       Plan Discussed with: CRNA  Anesthesia Plan Comments: (Pt declined nerve block and MAC. )       Anesthesia Quick Evaluation

## 2024-01-26 NOTE — Discharge Summary (Signed)
 Family Medicine Teaching [facility] Discharge Summary  Patient name: [name] [name] Medical record number: [id number] Date of birth: 03-Apr-1971 Age: 53 y.o. Gender: male Date of Admission: 01/22/2024  Date of Discharge: 01/26/2024 Admitting Physician: Doreene Eland, MD  Primary Care Provider: [name], MD Consultants: Nephrology, [facility]  Indication for Hospitalization: Intractable vomiting  Discharge Diagnoses/Problem List:  Principal Problem for Admission: Intractable vomiting Other Problems addressed during stay:  Active Problems:   Severe hypertension   T2DM (type 2 diabetes mellitus) (HCC)   ESRD (end stage renal disease) on dialysis [facility])   Depression   A-V fistula [facility])   Brief Hospital Course:  [name] [name] is a 53 y.o.male with a history of ESRD on HD, DM2, HLD, HTN, PAD, secondary hyperparathyroidism who was admitted to the [facility] Medicine Teaching Service at [facility] for intractable nausea and vomiting and severe HTN. His hospital course is detailed below:  Intractable Vomiting/Abd pain Unclear etiology, though known history of chronic nausea.  CT AP w/ contrast showed nonspecific colitis of ascending, transverse, and descending colon, cholelithiasis.  Pt was started on IV PPI, Zofran, and Reglan for symptom control.  Given borderline prolonged QTc to 486, discontinued Zofran and Reglan the following day and added on IM Tigan PRN, which was not needed as nausea resolved 24 hours after admission.  Diet was gradually advanced and patient was tolerating solids with adequate fluid intake at time of discharge.  GI consulted  this admission, evaluated patient and agreed with plan.  Considered endoscopy and CTA/MRA abdomen, but did not pursue given ischemic colitis being unlikely.  Noted pancreatic lesions on MRCP from prior admission and recommended follow-up MRI in 6 months.  GI then signed off with plan for outpatient follow up.  Acute HTN elevation BP on  admission 200s/100s secondary to missed home BP medication doses in setting of nausea/vomiting.  Home BP/antiarrhythmic medication regimen switched to IV while pt actively nauseous/vomiting, then transitioned to PO the following day.  Regimen included hydralazine 100 mg BID, diltiazem 300 mg daily, metoprolol succinate 25 mg daily, and carvedilol 12.5 mg daily.  Discontinued carvedilol due to concurrent metoprolol use.  BP remained elevated and losartan 25 mg was ordered.  On day of discharge, patient was NPO for AV fistula creation surgery and BP meds were held.  BP was again 200s/100s, but after meds were resumed in post-operative period, improved to 165/99.  Patient discharged without chest pain, dyspnea, or headache with close outpatient follow up.  AV fistula creation On the day prior to discharge, noted that patient already had outpatient L arm fistula creation planned for 3/5 with [facility]VS.  Contacted [facility], who agreed with proceeding.  Patient remained hospitalized for procedure and on day of discharge, AV fistula in L arm was created successfully without complication.  Patient was discharged later in the day with pain well controlled and close outpatient follow up.  ESRD on HD Patient continued to receive HD on TTS schedule this admission and resumed outpatient HD schedule after discharge.  Other chronic conditions were medically managed with home medications and formulary alternatives as necessary.  PCP Follow-up Recommendations: Please repeat BMP, losartan 25 mg newly started shortly before discharge. 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 Carvedilol discontinued in favor of metoprolol, did not want 2 beta blockers. Hepatitis B nonimmune in setting of HD, vaccine booster refused inpatient, please follow up and discuss recommendation. Ensure GI follow up. Outpatient  PT recommended by PT, consider referral.   Disposition:  Home, outpatient PT recommended  Discharge Condition: Stable  Discharge Exam:  Vitals:   01/26/24 1330 01/26/24 1337  BP: (!) 184/97 (!) 165/99  Pulse: 87 83  Resp: 20 18  Temp: 97.7 F (36.5 C)   SpO2: 95% 97%   Physical Exam: General: Resting in bed in NAD. Eyes: PERRLA. ENTM: MMM.  Normocephalic. Neck: No JVD. Cardiovascular: RRR. No murmurs/rubs/gallops. Respiratory: Normal WOB on room air.  CTAB. Gastrointestinal:  No TTP, nondistended abdomen.  Normal bowel sounds. 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  Capillary refill <2 seconds. Derm: No rashes grossly. Neuro: Alert and oriented x4. Psych: Pleasant, appropriate.  Significant Procedures: AV fistula creation L arm 3/5  Significant Labs and Imaging:  Recent Labs  Lab 01/25/24 0333 01/26/24 0329  WBC 7.8 8.1  HGB 11.1* 10.5*  HCT 35.2* 32.9*  PLT 105* 97*   Recent Labs  Lab 01/25/24 0333 01/25/24 1623 01/26/24 0329  NA 135  --  134*  K 3.9  --  4.0  CL 95*  --  98  CO2 28  --  28  GLUCOSE 174*  --  110*  BUN 32*  --  18  CREATININE 8.17*  --  5.64*  CALCIUM 7.0*  --  6.8*  PHOS  --  5.7*  --    Results/Tests Pending at Time of Discharge: None  Discharge Medications:  Allergies as of 01/26/2024       Reactions   Iron Nausea And Vomiting   With the iron tablets   Tape Rash   Rash at site of tape--okay to use paper tape        Medication List     STOP taking these medications    carvedilol 12.5 MG tablet Commonly known as: COREG       TAKE these medications    Accu-Chek Guide Me w/Device Kit See admin instructions.   acetaminophen 500 MG tablet Commonly known as: TYLENOL Take 2 tablets (1,000 mg total) by mouth every 6 (six) hours as needed. What changed: reasons to take this   AgaMatrix Ultra-Thin Lancets Misc Check blood sugars before meals twice daily   atorvastatin 40 MG tablet Commonly known as: LIPITOR Take 1 tablet (40 mg total) by mouth at bedtime. What  changed: when to take this   calcitRIOL 0.25 MCG capsule Commonly known as: ROCALTROL Take 1 capsule (0.25 mcg total) by mouth Every Tuesday,Thursday,and Saturday with dialysis.   Darbepoetin Alfa 150 MCG/0.3ML Sosy injection Commonly known as: ARANESP Inject 0.3 mLs (150 mcg total) into the skin every Saturday at 6 PM. What changed: additional instructions   diclofenac Sodium 1 % Gel Commonly known as: Voltaren Apply 4 g topically 4 (four) times daily. What changed:  when to take this reasons to take this   diltiazem 300 MG 24 hr capsule Commonly known as: CARDIZEM CD TAKE 1 CAPSULE(300 MG) BY MOUTH DAILY   FLUoxetine 20 MG capsule Commonly known as: PROZAC Take 1 capsule (20 mg total) by mouth daily.   gabapentin 300 MG capsule Commonly known as: NEURONTIN Take 1 capsule (300 mg total) by mouth daily.   glucose blood test strip 1 each by Other route as needed for other. Use as instructed   glucose blood test strip Commonly known as: AgaMatrix Presto Test Check blood sugars twice daily before meals   HumaLOG 100 UNIT/ML injection Generic drug: insulin lispro 0-6 Units, Subcutaneous, 3 times daily with meals, First  dose on Sun 10/17/23 at 0800 Correction coverage: Very Sensitive (ESRD/Dialysis) CBG < 70: Implement Hypoglycemia Standing Orders and refer to Hypoglycemia Standing Orders sidebar report CBG 70 - 120: 0 units CBG 121 - 150: 0 units CBG 151 - 200: 1 unit CBG 201-250: 2 units CBG 251-300: 3 units CBG 301-350: 4 units CBG 351-400: 5 units CBG > 400: Give 6 units and call MD What changed: additional instructions   hydrALAZINE 50 MG tablet Commonly known as: APRESOLINE Take 1 tablet (50 mg total) by mouth 2 (two) times daily. What changed: how much to take   lidocaine 4 % Commonly known as: HM Lidocaine Patch Place 1 patch onto the skin daily. What changed:  when to take this reasons to take this   losartan 25 MG tablet Commonly known as:  COZAAR Take 1 tablet (25 mg total) by mouth daily.   meloxicam 15 MG tablet Commonly known as: MOBIC Take 15 mg by mouth daily.   metoprolol succinate 25 MG 24 hr tablet Commonly known as: TOPROL-XL Take 25 mg by mouth daily.   ondansetron 4 MG disintegrating tablet Commonly known as: ZOFRAN-ODT Take 1 tablet (4 mg total) by mouth every 8 (eight) hours as needed for nausea or vomiting.   oxyCODONE-acetaminophen 10-325 MG tablet Commonly known as: PERCOCET Take 1 tablet by mouth every 8 (eight) hours as needed for pain.   pantoprazole 40 MG tablet Commonly known as: PROTONIX Take 1 tablet (40 mg total) by mouth daily.   sevelamer carbonate 800 MG tablet Commonly known as: RENVELA Take 2 tablets (1,600 mg total) by mouth 3 (three) times daily with meals. What changed: when to take this   tamsulosin 0.4 MG Caps capsule Commonly known as: FLOMAX TAKE 1 CAPSULE(0.4 MG) BY MOUTH DAILY AFTER SUPPER What changed: See the new instructions.   tiZANidine 4 MG tablet Commonly known as: ZANAFLEX TAKE 1/2 TABLET(2 MG) BY MOUTH DAILY AS NEEDED FOR MUSCLE SPASMS What changed: See the new instructions.               Durable Medical Equipment  (From admission, onward)           Start     Ordered   01/25/24 1437  For home use only DME Shower stool  Once       Comments: With back   01/25/24 1436            Discharge Instructions: Please refer to Patient Instructions section of EMR for full details.  Patient was counseled important signs and symptoms that should prompt return to medical care, changes in medications, dietary instructions, activity restrictions, and follow up appointments.   Follow-Up Appointments: Future Appointments  Date Time Provider Department Center  01/28/2024  4:10 PM [name], MD [facility]  02/16/2024 11:30 AM [name], MD [facility]  03/03/2024  1:30 PM [facility] HS VASC 6 [facility]-H[facility]I [facility]  03/03/2024  2:30 PM [facility]  [facility]    Sharion Dove, Georgiana Spillane, MD 01/26/2024, 2:44 PM PGY-1, [facility]

## 2024-01-26 NOTE — Progress Notes (Signed)
 DISCHARGE NOTE HOME [name] [name] to be discharged Home per MD order. Discussed prescriptions and follow up appointments with the patient. Prescriptions given to patient; medication list explained in detail. Patient verbalized understanding.  Skin clean, dry and intact without evidence of skin break down, no evidence of skin tears noted. IV catheter discontinued intact. Site without signs and symptoms of complications. Dressing and pressure applied. Pt denies pain at the site currently. No complaints noted.  Patient free of lines, drains, and wounds other than noted on LDA  An After Visit Summary (AVS) was printed and given to the patient. Patient escorted via wheelchair, and discharged home via private auto.  Velia Meyer, RN

## 2024-01-26 NOTE — Discharge Instructions (Addendum)
 Dear [name] [name],  Thank you for letting us participate in your care. You were hospitalized for vomiting and severe hypertension.  POST-HOSPITAL & CARE INSTRUCTIONS Please review your medications attached to this document, and take them as prescribed. Please DO NOT take carvedilol. Avoid NSAIDs (ibuprofen, naproxen, etc). OK to take Tylenol. Please return to the hospital if you experience uncontrolled vomiting again, severe abdominal pain, agonizing headache, shortness of breath, or chest pain. Please go to your follow up appointments (listed below).  Gastroenterology should call you to schedule an appointment in the next 1-2 weeks.   DOCTOR'S APPOINTMENT   Future Appointments  Date Time Provider Department Center  01/28/2024  4:10 PM [name], MD [facility]  02/01/2024  9:30 AM [name], MD [facility]  02/16/2024 11:30 AM [name], MD [facility]    Follow-up Information     [name], MD. Go on 02/01/2024.   Specialty: Family Medicine Contact information: [address] [place] [address] [phone]                 Take care and be well!  Family Medicine Teaching Service Inpatient Team [facility]  [address] [place], [place] [address] [phone]

## 2024-01-26 NOTE — Progress Notes (Signed)
 [facility] Progress Note   Subjective:   Seen in room. Had 1st stage AVF creation in OR today  Dialysis yesterday no issues. Going home today.   Objective Vitals:   01/26/24 1300 01/26/24 1315 01/26/24 1330 01/26/24 1337  BP: (!) 174/97 (!) 183/95 (!) 184/97 (!) 165/99  Pulse: 90 87 87 83  Resp: 15 15 20 18   Temp:   97.7 F (36.5 C)   TempSrc:      SpO2: 98% 94% 95% 97%  Weight:      Height:        Additional Objective Labs: Basic Metabolic Panel: Recent Labs  Lab 01/24/24 0323 01/25/24 0333 01/25/24 1623 01/26/24 0329  NA 137 135  --  134*  K 3.5 3.9  --  4.0  CL 94* 95*  --  98  CO2 30 28  --  28  GLUCOSE 226* 174*  --  110*  BUN 25* 32*  --  18  CREATININE 6.65* 8.17*  --  5.64*  CALCIUM 7.6* 7.0*  --  6.8*  PHOS  --   --  5.7*  --    CBC: Recent Labs  Lab 01/22/24 2003 01/23/24 0516 01/24/24 0323 01/25/24 0333 01/26/24 0329  WBC 8.5 9.0 8.8 7.8 8.1  NEUTROABS 7.0  --   --   --   --   HGB 12.1* 11.4* 10.2* 11.1* 10.5*  HCT 39.0 37.1* 32.8* 35.2* 32.9*  MCV 85.9 86.1 84.5 84.2 83.1  PLT 120* 119* 111* 105* 97*   Blood Culture    Component Value Date/Time   SDES  09/10/2022 1654    BLOOD RIGHT HAND Performed at [facility], [address] [place], [place] [address]    [facility]  09/10/2022 1654    BOTTLES DRAWN AEROBIC AND ANAEROBIC Blood Culture adequate volume Performed at [facility], [address] [place], [place] [address]    CULT  09/10/2022 1654    NO GROWTH 5 DAYS Performed at [facility], [address], [place], [place] [address]    REPTSTATUS 09/15/2022 FINAL 09/10/2022 1654     Physical Exam General: Alert, nad Heart: RRR Lungs: Clear, normal wob Abdomen: soft  Extremities: No edema Dialysis Access: TDC: new L AVF   Medications:   Chlorhexidine Gluconate Cloth  6 each Topical Q0600   diltiazem  300 mg Oral Daily   feeding supplement (NEPRO CARB STEADY)  237 mL Oral  BID BM   fentaNYL       FLUoxetine  20 mg Oral Daily   hepatitis b vaccine  0.5 mL Intramuscular Once   hydrALAZINE  100 mg Oral BID   insulin aspart  0-6 Units Subcutaneous TID WC   losartan  25 mg Oral Daily   metoprolol succinate  25 mg Oral Daily   pantoprazole  40 mg Oral Daily   sodium chloride flush  10-40 mL Intracatheter Q12H    Dialysis Orders:  TTS South 4h  B400   100.9kg   2K bath  RIJ TDC   Heparin 2000 - last OP HD 3/1, pre wt 100.9 and post wt 99.1 - he wsa 3-6kg over post HD for 2 wks prior to 3/1  - rocaltrol 0.25 mcg po tts - mircera 50 mcg IV q 2 wks, last 2/18, due 3/04  Assessment/Plan: Intractable vomiting/ abd pain - CT w/ nonspecific colitis. Gallstones w/o evidence of infection. GI consulted.  Uncontrolled HTN - bp's 220/  110 in ED initially. BP's better today on home meds.  ESRD - on HD TTS. Cont on schedule.  HD access - 1st stage BC AVF placed 01/26/24 Dr. Damaris Schooner - he is 3-4kg under dry wt and this has acutely happened due to GI losses. Looks sig dry on exam.  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  Secondary hyperparathyroidism - Continue home meds.   Tomasa Blase PA-C [facility] 01/26/2024,2:36 PM

## 2024-01-26 NOTE — Progress Notes (Signed)
 D/C order noted. Contacted [facility] to be advised that pt was d/c today and should resume care tomorrow.   Olivia Canter Renal Navigator [phone]

## 2024-01-26 NOTE — TOC Transition Note (Signed)
 Transition of Care [facility]) - Discharge Note   Patient Details  Name: [name] [name] MRN: [id number] Date of Birth: 1971-08-15  Transition of [facility] CM/SW Contact:  Tom-Johnson, Hershal Coria, RN Phone Number: 01/26/2024, 4:16 PM   Clinical Narrative:     Patient is scheduled for discharge today.  Outpatient f/u, hospital f/u and discharge instructions on AVS. Prescriptions sent to [facility] pharmacy and patient will receive meds prior discharge. Sister, Darl Pikes to transport at discharge.  No further TOC needs noted.        Final next level of care: Home/Self Care Barriers to Discharge: Barriers Resolved   Patient Goals and CMS Choice Patient states their goals for this hospitalization and ongoing recovery are:: To return home CMS Medicare.gov Compare Post Acute Care list provided to:: Patient Choice offered to / list presented to : Patient      Discharge Placement                Patient to be transferred to facility by: Sister Name of family member notified: Darl Pikes    Discharge Plan and Services Additional resources added to the After Visit Summary for                  DME Arranged: Shower stool DME Agency: [employer] Date DME Agency Contacted: 01/26/24 Time DME Agency Contacted: 1400 Representative spoke with at DME Agency: Earna Coder            Social Drivers of Health (SDOH) Interventions SDOH Screenings   Food Insecurity: Patient Declined (01/23/2024)  Recent Concern: Food Insecurity - Food Insecurity Present (12/01/2023)  Housing: Patient Declined (01/23/2024)  Transportation Needs: Patient Declined (01/23/2024)  Utilities: Patient Declined (01/23/2024)  Alcohol Screen: Low Risk  (06/08/2023)  Depression (PHQ2-9): High Risk (01/17/2024)  Financial Resource Strain: High Risk (10/30/2023)  Physical Activity: Unknown (10/30/2023)  Social Connections: Patient Declined (01/23/2024)  Recent Concern: Social Connections - Socially Isolated (01/23/2024)  Stress: Stress  Concern Present (10/30/2023)  Tobacco Use: High Risk (01/26/2024)  Health Literacy: Inadequate Health Literacy (06/08/2023)     Readmission Risk Interventions    01/25/2024    2:16 PM  Readmission Risk Prevention Plan  Transportation Screening Complete  PCP or Specialist Appt within 5-7 Days Complete  Home Care Screening Complete  Medication Review (RN CM) Referral to Pharmacy

## 2024-01-26 NOTE — Assessment & Plan Note (Deleted)
 Resolved.  Chronic issue with some colitis noted on CTAP.  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 - Tigan 200 mg IM Q8h PRN - Pantoprazole 40 mg PO - Advanced diet to solids, no issues - Appreciate GI recs:  -Holding off on CTA or MRA, ischemic colitis unlikely  -Outpatient surveillance colonoscopy when able  -Probable MRI in 6 months for pancreatic lesions surveillance  -Outpatient follow up to be scheduled after discharge

## 2024-01-26 NOTE — Anesthesia Procedure Notes (Signed)
 Procedure Name: LMA Insertion Date/Time: 01/26/2024 11:20 AM  Performed by: Loleta Huzaifa Viney, CRNAPre-anesthesia Checklist: Patient identified, Patient being monitored, Timeout performed, Emergency Drugs available and Suction available Patient Re-evaluated:Patient Re-evaluated prior to induction Oxygen Delivery Method: Circle system utilized Preoxygenation: Pre-oxygenation with 100% oxygen Induction Type: IV induction Ventilation: Mask ventilation without difficulty LMA: LMA inserted LMA Size: 5.0 Tube type: Oral Number of attempts: 1 Placement Confirmation: positive ETCO2 and breath sounds checked- equal and bilateral Tube secured with: Tape Dental Injury: Teeth and Oropharynx as per pre-operative assessment

## 2024-01-26 NOTE — Progress Notes (Signed)
 High BP in short stay. No new orders per Dr. Hart [place]. Will continue to monitor.

## 2024-01-26 NOTE — Op Note (Signed)
    OPERATIVE NOTE  PROCEDURE:   Intraoperative left arm vein mapping left arm 1st stage brachiobasilic AVF creation  PRE-OPERATIVE DIAGNOSIS: ESRD  POST-OPERATIVE DIAGNOSIS: same as above   SURGEON: Daria Pastures MD  ASSISTANT(S): none  ANESTHESIA: general  ESTIMATED BLOOD LOSS: 15 cc  FINDING(S): Sufficiently sized left arm basilic vein from the upper arm to the [facility] fossa Sufficiently sized left brachial artery at the [facility] fossa Palpable and doppler thrill in AVF with multiphasic radial artery on completion  SPECIMEN(S):  none  INDICATIONS:   [name] [name] is a 53 y.o. male with ESRD. The patient is currently on dialysis via [facility]. They were seen in the office for evaluation of hemodialysis access. The risks an benefits including of access creation were reviewed including: need for additional procedures, need for additional creations, steal, ischemia monomelic neuropathy, failure of access, and bleeding. The patient expressed understand and is willing to proceed.    DESCRIPTION: The patient was brought to the operating room positioned supine on the operating table.  The left arm was prepped and draped in usual sterile fashion.  Timeout was performed and preoperative antibiotics were administered.  The basilic vein in the left arm was identified using ultrasound and appeared of sufficient size. A transverse incision was made above the elbow creese in the antecubital fossa. The basilic vein was identified and isolated for 4 cm in length.  The bicipital aponeurosis was partially released and the brachial artery was dissected free from its paired brachial veins and secured with a vessel loop. The patient was heparinized. The basilic vein was marked and ligated distally with 2-0 silk, then flushed with heparinized saline. Vascular clamps were placed proximally and distally on the brachial artery and a 5 mm arteriotomy  was created on the brachial artery. This was flushed with heparin  saline. The vein was juxtaposed to the artery and an anastomosis was created using 6-0 Prolene.  Prior to completing the anastomsis, the vessels were flushed and the suture line was tied down. There was an excellent doppler thrill in the basilic vein from the anastomosis into the upper arm and a multiphasic radial signal at the wrist. The incision was irrigated and hemostasis acheived. The deeper tissue was closed with 3-0 Vicryl and the skin closed with 4-0 Monocryl.   Dermabond was applied the incisions and the arm was ACE wrapped. The patient was transferred to PACU in stable condition.    COMPLICATIONS: none apparent  CONDITION: stable  Daria Pastures MD [facility] Phone Number: [phone] 01/26/2024 12:41 PM

## 2024-01-26 NOTE — Assessment & Plan Note (Deleted)
 Last A1c 6.1 in November 2024.  CBGs appropriate, only 2 units SAI daily. - vsSSI - No LAI - CBGs ACHS

## 2024-01-26 NOTE — Discharge Planning (Signed)
 [facility] Patient Discharge Orders- [facility] CLINIC: [place]   Patient's name: [name] [name] Admit/DC Dates: 01/22/2024 - 01/26/2024  Discharge Diagnoses: Abd pain/nausea - resolved   HTN - meds adjusted S/p 1st stage AVF   Recent Labs  Lab 01/23/24 0516 01/24/24 0323 01/25/24 1623 01/26/24 0329  HGB 11.4*   < >  --  10.5*  K 3.4*   < >  --  4.0  CALCIUM 8.1*   < >  --  6.8*  PHOS  --   --  5.7*  --   ALBUMIN 2.6*  --   --   --    < > = values in this interval not displayed.   Aranesp: Given: --   Date of last dose/amount: --   PRBC's Given: -- Date/# of units: -- ESA dose for discharge: per protocol   Outpatient Dialysis Orders:  -Heparin: No change  -EDW No change   -[place]: No change   Access intervention/Change:  L arm 1st stage AVF placed 01/26/24 by  Dr. Hetty Blend   -IV Antibiotics: --  OTHER/APPTS   Completed by: Tomasa Blase PA-C   D/C Meds to be reconciled by nurse after every discharge.    Reviewed by: MD:______ RN_______

## 2024-01-26 NOTE — Transfer of Care (Signed)
 Immediate Anesthesia Transfer of Care Note  Patient: [name] [name]  Procedure(s) Performed: LEFT ARTERIOVENOUS (AV) FISTULA CREATION (Left: Arm Upper)  Patient Location: PACU  Anesthesia Type:General  Level of Consciousness: awake  Airway & Oxygen Therapy: Patient Spontanous Breathing  Post-op Assessment: Report given to RN and Post -op Vital signs reviewed and stable  Post vital signs: Reviewed and stable  Last Vitals:  Vitals Value Taken Time  BP 178/105 01/26/24 1255  Temp    Pulse 88 01/26/24 1257  Resp 12 01/26/24 1256  SpO2 96 % 01/26/24 1257  Vitals shown include unfiled device data.  Last Pain:  Vitals:   01/26/24 0855  TempSrc:   PainSc: 0-No pain      Patients Stated Pain Goal: 2 (01/23/24 1028)  Complications: No notable events documented.

## 2024-01-26 NOTE — Anesthesia Postprocedure Evaluation (Signed)
 Anesthesia Post Note  Patient: ADHRIT KRENZ  Procedure(s) Performed: LEFT ARTERIOVENOUS (AV) FISTULA CREATION (Left: Arm Upper)     Patient location during evaluation: PACU Anesthesia Type: General Level of consciousness: awake and alert Pain management: pain level controlled Vital Signs Assessment: post-procedure vital signs reviewed and stable Respiratory status: spontaneous breathing, nonlabored ventilation, respiratory function stable and patient connected to nasal cannula oxygen Cardiovascular status: blood pressure returned to baseline and stable Postop Assessment: no apparent nausea or vomiting Anesthetic complications: no  No notable events documented.  Last Vitals:  Vitals:   01/26/24 1330 01/26/24 1337  BP: (!) 184/97 (!) 165/99  Pulse: 87 83  Resp: 20 18  Temp: 36.5 C   SpO2: 95% 97%    Last Pain:  Vitals:   01/26/24 1300  TempSrc:   PainSc: 7                  Shelton Silvas

## 2024-01-26 NOTE — Interval H&P Note (Signed)
 History and Physical Interval Note:  01/26/2024 7:50 AM  [name] [name]  has presented today for surgery, with the diagnosis of ESRD.  The various methods of treatment have been discussed with the patient and family. After consideration of risks, benefits and other options for treatment, the patient has consented to  Procedure(s): LEFT ARTERIOVENOUS (AV) FISTULA CREATION (Left) as a surgical intervention.  The patient's history has been reviewed, patient examined, no change in status, stable for surgery.  I have reviewed the patient's chart and labs.  Questions were answered to the patient's satisfaction.     Daria Pastures

## 2024-01-27 ENCOUNTER — Telehealth: Payer: Self-pay | Admitting: Nephrology

## 2024-01-27 ENCOUNTER — Encounter (HOSPITAL_COMMUNITY): Payer: Self-pay | Admitting: Vascular Surgery

## 2024-01-27 ENCOUNTER — Telehealth: Payer: Self-pay

## 2024-01-27 DIAGNOSIS — D631 Anemia in chronic kidney disease: Secondary | ICD-10-CM | POA: Diagnosis not present

## 2024-01-27 DIAGNOSIS — D689 Coagulation defect, unspecified: Secondary | ICD-10-CM | POA: Diagnosis not present

## 2024-01-27 DIAGNOSIS — T8249XA Other complication of vascular dialysis catheter, initial encounter: Secondary | ICD-10-CM | POA: Diagnosis not present

## 2024-01-27 DIAGNOSIS — E1122 Type 2 diabetes mellitus with diabetic chronic kidney disease: Secondary | ICD-10-CM | POA: Diagnosis not present

## 2024-01-27 DIAGNOSIS — Z23 Encounter for immunization: Secondary | ICD-10-CM | POA: Diagnosis not present

## 2024-01-27 DIAGNOSIS — Z992 Dependence on renal dialysis: Secondary | ICD-10-CM | POA: Diagnosis not present

## 2024-01-27 DIAGNOSIS — D509 Iron deficiency anemia, unspecified: Secondary | ICD-10-CM | POA: Diagnosis not present

## 2024-01-27 DIAGNOSIS — N186 End stage renal disease: Secondary | ICD-10-CM | POA: Diagnosis not present

## 2024-01-27 DIAGNOSIS — N2581 Secondary hyperparathyroidism of renal origin: Secondary | ICD-10-CM | POA: Diagnosis not present

## 2024-01-27 NOTE — Transitions of Care (Post Inpatient/ED Visit) (Signed)
   01/27/2024  Name: [name] [name] MRN: [id number] DOB: 1971/02/18  Today's TOC FU Call Status: Today's TOC FU Call Status:: Unsuccessful Call (2nd Attempt) Unsuccessful Call (2nd Attempt) Date: 01/27/24  Attempted to reach the patient regarding the most recent Inpatient/ED visit.  Follow Up Plan: Additional outreach attempts will be made to reach the patient to complete the Transitions of Care (Post Inpatient/ED visit) call.   Hilbert Odor RN, CCM [place]  VBCI-Population Health RN Care Manager [phone]

## 2024-01-27 NOTE — Telephone Encounter (Signed)
 Transition of care contact from inpatient facility  Date of Discharge: 01/26/24 Date of Contact: 01/27/24 Method of contact: Phone  Attempted to contact patient to discuss transition of care from inpatient admission. Patient did not answer the phone. Will continue outreach attempts.

## 2024-01-27 NOTE — Transitions of Care (Post Inpatient/ED Visit) (Signed)
   01/27/2024  Name: [name] [name] MRN: [id number] DOB: Mar 28, 1971  Today's TOC FU Call Status: Today's TOC FU Call Status:: Unsuccessful Call (1st Attempt) Unsuccessful Call (1st Attempt) Date: 01/27/24  Attempted to reach the patient regarding the most recent Inpatient/ED visit.  Follow Up Plan: Additional outreach attempts will be made to reach the patient to complete the Transitions of Care (Post Inpatient/ED visit) call.   Hilbert Odor RN, CCM [place]  VBCI-Population Health RN Care Manager [phone]

## 2024-01-27 NOTE — Telephone Encounter (Signed)
 Called and left patient a detailed vm asking that he give [place] a call back to schedule hospital follow up with Dr. Myrtie Neither.

## 2024-01-28 ENCOUNTER — Telehealth: Payer: Self-pay

## 2024-01-28 ENCOUNTER — Inpatient Hospital Stay: Payer: Self-pay | Admitting: Student

## 2024-01-28 ENCOUNTER — Ambulatory Visit: Payer: 59

## 2024-01-28 NOTE — Progress Notes (Deleted)
    SUBJECTIVE:   CHIEF COMPLAINT / HPI:   Admitted 3/1 to 3/5 for intractable vomiting/abdominal pain and acute hypertension  Please repeat BMP, losartan 25 mg newly started shortly before discharge. 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 Carvedilol discontinued in favor of metoprolol, did not want 2 beta blockers. Hepatitis B nonimmune in setting of HD, vaccine booster refused inpatient, please follow up and discuss recommendation. Ensure GI follow up. Outpatient PT recommended by PT, consider referral.    PERTINENT  PMH / PSH: ***  OBJECTIVE:   There were no vitals taken for this visit.  ***  ASSESSMENT/PLAN:   No problem-specific Assessment & Plan notes found for this encounter.     [name], MD [facility]

## 2024-01-28 NOTE — Transitions of Care (Post Inpatient/ED Visit) (Signed)
   01/28/2024  Name: [name] [name] MRN: [id number] DOB: 1971/07/29  Today's TOC FU Call Status: Today's TOC FU Call Status:: Unsuccessful Call (3rd Attempt) Unsuccessful Call (3rd Attempt) Date: 01/28/24  Attempted to reach the patient regarding the most recent Inpatient/ED visit.  Follow Up Plan: No further outreach attempts will be made at this time. We have been unable to contact the patient.  Hilbert Odor RN, CCM [place]  VBCI-Population Health RN Care Manager [phone]

## 2024-01-29 DIAGNOSIS — D689 Coagulation defect, unspecified: Secondary | ICD-10-CM | POA: Diagnosis not present

## 2024-01-29 DIAGNOSIS — E1122 Type 2 diabetes mellitus with diabetic chronic kidney disease: Secondary | ICD-10-CM | POA: Diagnosis not present

## 2024-01-29 DIAGNOSIS — D509 Iron deficiency anemia, unspecified: Secondary | ICD-10-CM | POA: Diagnosis not present

## 2024-01-29 DIAGNOSIS — N186 End stage renal disease: Secondary | ICD-10-CM | POA: Diagnosis not present

## 2024-01-29 DIAGNOSIS — Z23 Encounter for immunization: Secondary | ICD-10-CM | POA: Diagnosis not present

## 2024-01-29 DIAGNOSIS — Z992 Dependence on renal dialysis: Secondary | ICD-10-CM | POA: Diagnosis not present

## 2024-01-29 DIAGNOSIS — T8249XA Other complication of vascular dialysis catheter, initial encounter: Secondary | ICD-10-CM | POA: Diagnosis not present

## 2024-01-29 DIAGNOSIS — D631 Anemia in chronic kidney disease: Secondary | ICD-10-CM | POA: Diagnosis not present

## 2024-01-29 DIAGNOSIS — N2581 Secondary hyperparathyroidism of renal origin: Secondary | ICD-10-CM | POA: Diagnosis not present

## 2024-01-31 ENCOUNTER — Encounter: Payer: Self-pay | Admitting: Orthopedic Surgery

## 2024-01-31 ENCOUNTER — Ambulatory Visit (INDEPENDENT_AMBULATORY_CARE_PROVIDER_SITE_OTHER): Admitting: Orthopedic Surgery

## 2024-01-31 ENCOUNTER — Other Ambulatory Visit: Payer: Self-pay | Admitting: Student

## 2024-01-31 DIAGNOSIS — S88111S Complete traumatic amputation at level between knee and ankle, right lower leg, sequela: Secondary | ICD-10-CM

## 2024-01-31 DIAGNOSIS — G8929 Other chronic pain: Secondary | ICD-10-CM | POA: Diagnosis not present

## 2024-01-31 DIAGNOSIS — Z89511 Acquired absence of right leg below knee: Secondary | ICD-10-CM

## 2024-01-31 DIAGNOSIS — M25561 Pain in right knee: Secondary | ICD-10-CM | POA: Diagnosis not present

## 2024-01-31 MED ORDER — LIDOCAINE HCL (PF) 1 % IJ SOLN
5.0000 mL | INTRAMUSCULAR | Status: AC | PRN
Start: 2024-01-31 — End: 2024-01-31
  Administered 2024-01-31: 5 mL

## 2024-01-31 MED ORDER — METHYLPREDNISOLONE ACETATE 40 MG/ML IJ SUSP
40.0000 mg | INTRAMUSCULAR | Status: AC | PRN
  Administered 2024-01-31: 40 mg via INTRA_ARTICULAR

## 2024-01-31 NOTE — Telephone Encounter (Signed)
 3rd attempt to reach patient. Left message on vm for patient to return call to schedule hospital follow up.

## 2024-01-31 NOTE — Progress Notes (Signed)
 Office Visit Note   Patient: [name] [name]           Date of Birth: May 20, 1971           MRN: [id number] Visit Date: 01/31/2024              Requested by: [name], MD [address] [place],  [place] [address] PCP: [name], MD  Chief Complaint  Patient presents with   Right Leg - Follow-up    Hx BKA 2019       HPI: Patient is a 53 year old gentleman who is status post a right below-knee amputation 2019.  Patient states he has been having medial patella pain since surgery.  Patient states he has start up stiffness.  Patient is status post MRI scan of the right knee.  He is on dialysis.  Patient states he had an injection in January without relief.  Patient is status post trochanteric intramedullary fixation for an intertrochanteric hip fracture.  Assessment & Plan: Visit Diagnoses:  1. Chronic pain of right knee   2. Below-knee amputation of right lower extremity, sequela (HCC)     Plan: Patient underwent a intra-articular injection for the right knee.  We will set him up with physical therapy upstairs to work on quad strengthening to improve the patella tracking.  Patient is to follow-up with [facility] for a new socket.  Follow-Up Instructions: No follow-ups on file.   Ortho Exam  Patient is alert, oriented, no adenopathy, well-dressed, normal affect, normal respiratory effort. Examination patient has significant quad atrophy.  Review of the MRI scan shows no ligamentous or meniscal injury.  Patient is point tender to palpation over the medial patellofemoral joint.  There is no effusion.  Medial lateral joint lines are nontender to palpation.  Imaging: No results found. No images are attached to the encounter.  Labs: Lab Results  Component Value Date   HGBA1C 6.1 (H) 10/13/2023   HGBA1C 6.6 05/18/2023   HGBA1C 7.2 (H) 09/11/2022   ESRSEDRATE 68 (H) 04/10/2021   ESRSEDRATE 75 (H) 10/20/2017   CRP 1.9 (H) 10/20/2017   REPTSTATUS  09/15/2022 FINAL 09/10/2022   GRAMSTAIN  07/17/2021    FEW SQUAMOUS EPITHELIAL CELLS PRESENT FEW WBC SEEN FEW GRAM NEGATIVE RODS RARE GRAM POSITIVE COCCI Performed at [facility], [address], [place], [place] [address]    CULT  09/10/2022    NO GROWTH 5 DAYS Performed at [facility], [address], [place], [place] [address]    LABORGA PROTEUS MIRABILIS 07/17/2021     Lab Results  Component Value Date   ALBUMIN 2.6 (L) 01/23/2024   ALBUMIN 2.9 (L) 01/22/2024   ALBUMIN 2.0 (L) 12/03/2023    Lab Results  Component Value Date   MG 1.8 02/12/2023   MG 1.7 09/02/2021   MG 2.0 10/13/2017   Lab Results  Component Value Date   VD25OH 10.22 (L) 10/14/2023    No results found for: "PREALBUMIN"    Latest Ref Rng & Units 01/26/2024    3:29 AM 01/25/2024    3:33 AM 01/24/2024    3:23 AM  CBC EXTENDED  WBC 4.0 - 10.5 K/uL 8.1  7.8  8.8   RBC 4.22 - 5.81 MIL/uL 3.96  4.18  3.88   Hemoglobin 13.0 - 17.0 g/dL 46.9  62.9  52.8   HCT 39.0 - 52.0 % 32.9  35.2  32.8   Platelets 150 - 400 K/uL 97  105  111      There is no height or weight on file to calculate BMI.  Orders:  Orders Placed This Encounter  Procedures   Ambulatory referral to Physical Therapy   No orders of the defined types were placed in this encounter.    Procedures: Large Joint Inj: R knee on 01/31/2024 6:08 PM Indications: pain and diagnostic evaluation Details: 22 G 1.5 in needle, anteromedial approach  Arthrogram: No  Medications: 5 mL lidocaine (PF) 1 %; 40 mg methylPREDNISolone acetate 40 MG/ML Outcome: tolerated well, no immediate complications Procedure, treatment alternatives, risks and benefits explained, specific risks discussed. Consent was given by the patient. Immediately prior to procedure a time out was called to verify the correct patient, procedure, equipment, support staff and site/side marked as required. Patient was prepped and draped in the usual sterile fashion.       Clinical Data: No additional findings.  ROS:  All other systems negative, except as noted in the HPI. Review of Systems  Objective: Vital Signs: There were no vitals taken for this visit.  Specialty Comments:  No specialty comments available.  PMFS History: Patient Active Problem List   Diagnosis Date Noted   Nausea and vomiting 01/26/2024   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/25/2024   Depression 01/24/2024   Hypertensive urgency 01/24/2024   Hypotension 12/01/2023   Altered mental status 11/30/2023   ESRD (end stage renal disease) on dialysis (HCC) 11/30/2023   Right knee pain 10/24/2023   Nonimmune to hepatitis B virus 10/22/2023   Closed fracture of right hip (HCC) 10/15/2023   T2DM (type 2 diabetes mellitus) (HCC) 10/14/2023   Urinary retention 10/14/2023   Closed intertrochanteric fracture of right hip (HCC) 10/13/2023   Patellar tendinitis 10/12/2023   Wrist sprain, left, initial encounter 10/12/2023   Contracture, 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   Mucosal abnormality of esophagus 02/18/2023   Foot pain, left 02/17/2023   SOB (shortness of breath) 02/16/2023   History of esophagitis 02/16/2023   Tachycardia 02/15/2023   Allergic rhinitis 11/07/2022   Chronic kidney disease, stage 5 (HCC) 09/11/2022   Tobacco dependence 09/11/2022   GERD with esophagitis 09/02/2021   Acute kidney injury superimposed on CKD (HCC) 04/10/2021   Achilles tendon contracture, left 03/03/2018   Non-pressure chronic ulcer of other part of left foot limited to breakdown of skin (HCC) 02/02/2018   History of right below knee amputation (HCC) 12/22/2017   Acute posthemorrhagic anemia    Falls 11/24/2017   Severe hypertension 11/24/2017   Anemia 11/24/2017   Uncontrolled type 2 diabetes mellitus with hyperglycemia, with long-term current use of insulin (HCC) 10/11/2017   ARF (acute renal failure) (HCC) 10/11/2017   Past Medical History:  Diagnosis Date   Abdominal pain 11/30/2023   Abdominal  tenderness 10/14/2023   Allergy    seasonal and environmental   Anemia    Chest pain 12/01/2023   Chronic constipation 05/13/2020   Depression    Diabetes mellitus without complication (HCC) 2019   Dyspnea    hx - SOB with exertion   ESRD on hemodialysis (HCC)    Tues, Thurs, Sat   GERD (gastroesophageal reflux disease)    Hyperlipidemia    Hypertension    Necrotizing fasciitis (HCC) 10/13/2017   Neuromuscular disorder (HCC)    neuropathy   PAD (peripheral artery disease) (HCC)    Secondary hyperparathyroidism (HCC)    SVT (supraventricular tachycardia) (HCC)    hx   Wound dehiscence 11/24/2017    Family History  Problem Relation Age  of Onset   Diabetes Mellitus II Mother    Depression Mother    Colon polyps Mother    Hypertension Mother    High Cholesterol Mother    Heart disease Father        CABG x 4.  04/2017   Diabetes Father    High Cholesterol Father    Hypertension Father    Heart disease Maternal Grandmother    Heart disease Maternal Grandfather    Colon cancer Neg Hx    Esophageal cancer Neg Hx    Liver cancer Neg Hx    Pancreatic cancer Neg Hx    Rectal cancer Neg Hx    Stomach cancer Neg Hx     Past Surgical History:  Procedure Laterality Date   AMPUTATION Right 10/13/2017   Procedure: RIGHT BELOW KNEE AMPUTATION;  Surgeon: Nadara Mustard, MD;  Location: [facility] OR;  Service: Orthopedics;  Laterality: Right;   AMPUTATION Right 11/24/2017   Procedure: AMPUTATION BELOW KNEE REVISION;  Surgeon: Nadara Mustard, MD;  Location[facility];  Service: Orthopedics;  Laterality: Right;   AMPUTATION TOE Left    AV FISTULA PLACEMENT Left 10/19/2023   Procedure: LEFT BRACHIOCEPHALIC ARTERIOVENOUS (AV) FISTULA CREATION;  Surgeon: Daria Pastures, MD;  Location: [facility] OR;  Service: Vascular;  Laterality: Left;   AV FISTULA PLACEMENT Left 01/26/2024   Procedure: LEFT ARTERIOVENOUS (AV) FISTULA CREATION;  Surgeon: Daria Pastures, MD;  Location: [facility] OR;  Service: Vascular;   Laterality: Left;   BIOPSY  09/02/2021   Procedure: BIOPSY;  Surgeon: Imogene Burn, MD;  Location: [facility] ENDOSCOPY;  Service: Gastroenterology;;   BIOPSY  02/18/2023   Procedure: BIOPSY;  Surgeon: Benancio Deeds, MD;  Location: [facility] ENDOSCOPY;  Service: Gastroenterology;;   COLONOSCOPY  05/11/2019   COLONOSCOPY  2021   3 polyps - tubular adenomas   ESOPHAGOGASTRODUODENOSCOPY (EGD) WITH PROPOFOL N/A 09/02/2021   Procedure: ESOPHAGOGASTRODUODENOSCOPY (EGD) WITH PROPOFOL;  Surgeon: Imogene Burn, MD;  Location: [facility] ENDOSCOPY;  Service: Gastroenterology;  Laterality: N/A;   ESOPHAGOGASTRODUODENOSCOPY (EGD) WITH PROPOFOL N/A 02/18/2023   Procedure: ESOPHAGOGASTRODUODENOSCOPY (EGD) WITH PROPOFOL;  Surgeon: Benancio Deeds, MD;  Location: [facility] ENDOSCOPY;  Service: Gastroenterology;  Laterality: N/A;   INTRAMEDULLARY (IM) NAIL INTERTROCHANTERIC Right 10/15/2023   Procedure: INTRAMEDULLARY (IM) NAIL INTERTROCHANTERIC;  Surgeon: Roby Lofts, MD;  Location: [facility] OR;  Service: Orthopedics;  Laterality: Right;   IR FLUORO GUIDE CV LINE RIGHT  10/15/2023   IR US GUIDE VASC ACCESS RIGHT  10/15/2023   POLYPECTOMY     WISDOM TOOTH EXTRACTION     Social History   Occupational History   Occupation: unemployed  Tobacco Use   Smoking status: Every Day    Current packs/day: 0.50    Average packs/day: 0.5 packs/day for 35.7 years (17.8 ttl pk-yrs)    Types: Cigarettes    Start date: 11/23/1988   Smokeless tobacco: Never   Tobacco comments:    Uninterested - 0/10  importance of quitting 02/2023  Vaping Use   Vaping status: Never Used  Substance and Sexual Activity   Alcohol use: No   Drug use: No   Sexual activity: Not Currently

## 2024-02-01 ENCOUNTER — Inpatient Hospital Stay: Payer: Self-pay | Admitting: Student

## 2024-02-01 DIAGNOSIS — D689 Coagulation defect, unspecified: Secondary | ICD-10-CM | POA: Diagnosis not present

## 2024-02-01 DIAGNOSIS — Z23 Encounter for immunization: Secondary | ICD-10-CM | POA: Diagnosis not present

## 2024-02-01 DIAGNOSIS — D509 Iron deficiency anemia, unspecified: Secondary | ICD-10-CM | POA: Diagnosis not present

## 2024-02-01 DIAGNOSIS — T8249XA Other complication of vascular dialysis catheter, initial encounter: Secondary | ICD-10-CM | POA: Diagnosis not present

## 2024-02-01 DIAGNOSIS — Z992 Dependence on renal dialysis: Secondary | ICD-10-CM | POA: Diagnosis not present

## 2024-02-01 DIAGNOSIS — N186 End stage renal disease: Secondary | ICD-10-CM | POA: Diagnosis not present

## 2024-02-01 DIAGNOSIS — D631 Anemia in chronic kidney disease: Secondary | ICD-10-CM | POA: Diagnosis not present

## 2024-02-01 DIAGNOSIS — E1122 Type 2 diabetes mellitus with diabetic chronic kidney disease: Secondary | ICD-10-CM | POA: Diagnosis not present

## 2024-02-01 DIAGNOSIS — N2581 Secondary hyperparathyroidism of renal origin: Secondary | ICD-10-CM | POA: Diagnosis not present

## 2024-02-01 NOTE — Telephone Encounter (Signed)
 No return call received. Letter mailed to patient to contact the office to schedule follow up.

## 2024-02-02 NOTE — Telephone Encounter (Signed)
 Patient has been scheduled for office visit on 5/15 at 3:40 with Dr. Myrtie Neither.

## 2024-02-03 DIAGNOSIS — D509 Iron deficiency anemia, unspecified: Secondary | ICD-10-CM | POA: Diagnosis not present

## 2024-02-03 DIAGNOSIS — N186 End stage renal disease: Secondary | ICD-10-CM | POA: Diagnosis not present

## 2024-02-03 DIAGNOSIS — Z23 Encounter for immunization: Secondary | ICD-10-CM | POA: Diagnosis not present

## 2024-02-03 DIAGNOSIS — Z992 Dependence on renal dialysis: Secondary | ICD-10-CM | POA: Diagnosis not present

## 2024-02-03 DIAGNOSIS — E1122 Type 2 diabetes mellitus with diabetic chronic kidney disease: Secondary | ICD-10-CM | POA: Diagnosis not present

## 2024-02-03 DIAGNOSIS — T8249XA Other complication of vascular dialysis catheter, initial encounter: Secondary | ICD-10-CM | POA: Diagnosis not present

## 2024-02-03 DIAGNOSIS — N2581 Secondary hyperparathyroidism of renal origin: Secondary | ICD-10-CM | POA: Diagnosis not present

## 2024-02-03 DIAGNOSIS — D689 Coagulation defect, unspecified: Secondary | ICD-10-CM | POA: Diagnosis not present

## 2024-02-03 DIAGNOSIS — D631 Anemia in chronic kidney disease: Secondary | ICD-10-CM | POA: Diagnosis not present

## 2024-02-04 ENCOUNTER — Ambulatory Visit (INDEPENDENT_AMBULATORY_CARE_PROVIDER_SITE_OTHER): Admitting: Rehabilitative and Restorative Service Providers"

## 2024-02-04 ENCOUNTER — Encounter: Payer: Self-pay | Admitting: Rehabilitative and Restorative Service Providers"

## 2024-02-04 DIAGNOSIS — R262 Difficulty in walking, not elsewhere classified: Secondary | ICD-10-CM | POA: Diagnosis not present

## 2024-02-04 DIAGNOSIS — R2681 Unsteadiness on feet: Secondary | ICD-10-CM

## 2024-02-04 DIAGNOSIS — G8929 Other chronic pain: Secondary | ICD-10-CM

## 2024-02-04 DIAGNOSIS — M25561 Pain in right knee: Secondary | ICD-10-CM | POA: Diagnosis not present

## 2024-02-04 DIAGNOSIS — M6281 Muscle weakness (generalized): Secondary | ICD-10-CM | POA: Diagnosis not present

## 2024-02-04 DIAGNOSIS — R296 Repeated falls: Secondary | ICD-10-CM

## 2024-02-04 NOTE — Therapy (Signed)
 OUTPATIENT PHYSICAL THERAPY LOWER EXTREMITY EVALUATION   Patient Name: [name] [name] MRN: [id number] DOB:04/25/1971, 53 y.o., male Today's Date: 02/04/2024  END OF SESSION:  PT End of Session - 02/04/24 1706     Visit Number 1    Number of Visits 16    Date for PT Re-Evaluation 03/31/24    Authorization Type [employer]    Progress Note Due on Visit 16    PT Start Time 1425    PT Stop Time 1517    PT Time Calculation (min) 52 min    Activity Tolerance Patient tolerated treatment well;No increased pain;Patient limited by pain    Behavior During Therapy [facility] for tasks assessed/performed             Past Medical History:  Diagnosis Date   Abdominal pain 11/30/2023   Abdominal tenderness 10/14/2023   Allergy    seasonal and environmental   Anemia    Chest pain 12/01/2023   Chronic constipation 05/13/2020   Depression    Diabetes mellitus without complication (HCC) 2019   Dyspnea    hx - SOB with exertion   ESRD on hemodialysis (HCC)    Tues, Thurs, Sat   GERD (gastroesophageal reflux disease)    Hyperlipidemia    Hypertension    Necrotizing fasciitis (HCC) 10/13/2017   Neuromuscular disorder (HCC)    neuropathy   PAD (peripheral artery disease) (HCC)    Secondary hyperparathyroidism (HCC)    SVT (supraventricular tachycardia) (HCC)    hx   Wound dehiscence 11/24/2017   Past Surgical History:  Procedure Laterality Date   AMPUTATION Right 10/13/2017   Procedure: RIGHT BELOW KNEE AMPUTATION;  Surgeon: Nadara Mustard, MD;  Location: [facility] OR;  Service: Orthopedics;  Laterality: Right;   AMPUTATION Right 11/24/2017   Procedure: AMPUTATION BELOW KNEE REVISION;  Surgeon: Nadara Mustard, MD;  Location: [facility] OR;  Service: Orthopedics;  Laterality: Right;   AMPUTATION TOE Left    AV FISTULA PLACEMENT Left 10/19/2023   Procedure: LEFT BRACHIOCEPHALIC ARTERIOVENOUS (AV) FISTULA CREATION;  Surgeon: Daria Pastures, MD;  Location: [facility] OR;  Service: Vascular;  Laterality: Left;    AV FISTULA PLACEMENT Left 01/26/2024   Procedure: LEFT ARTERIOVENOUS (AV) FISTULA CREATION;  Surgeon: Daria Pastures, MD;  Location: [facility] OR;  Service: Vascular;  Laterality: Left;   BIOPSY  09/02/2021   Procedure: BIOPSY;  Surgeon: Imogene Burn, MD;  Location: [facility] ENDOSCOPY;  Service: Gastroenterology;;   BIOPSY  02/18/2023   Procedure: BIOPSY;  Surgeon: Benancio Deeds, MD;  Location: [facility] ENDOSCOPY;  Service: Gastroenterology;;   COLONOSCOPY  05/11/2019   COLONOSCOPY  2021   3 polyps - tubular adenomas   ESOPHAGOGASTRODUODENOSCOPY (EGD) WITH PROPOFOL N/A 09/02/2021   Procedure: ESOPHAGOGASTRODUODENOSCOPY (EGD) WITH PROPOFOL;  Surgeon: Imogene Burn, MD;  Location: [facility] ENDOSCOPY;  Service: Gastroenterology;  Laterality: N/A;   ESOPHAGOGASTRODUODENOSCOPY (EGD) WITH PROPOFOL N/A 02/18/2023   Procedure: ESOPHAGOGASTRODUODENOSCOPY (EGD) WITH PROPOFOL;  Surgeon: Benancio Deeds, MD;  Location: [facility] ENDOSCOPY;  Service: Gastroenterology;  Laterality: N/A;   INTRAMEDULLARY (IM) NAIL INTERTROCHANTERIC Right 10/15/2023   Procedure: INTRAMEDULLARY (IM) NAIL INTERTROCHANTERIC;  Surgeon: Roby Lofts, MD;  Location: [facility] OR;  Service: Orthopedics;  Laterality: Right;   IR FLUORO GUIDE CV LINE RIGHT  10/15/2023   IR US GUIDE VASC ACCESS RIGHT  10/15/2023   POLYPECTOMY     WISDOM TOOTH EXTRACTION     Patient Active Problem List   Diagnosis Date Noted   Nausea and vomiting  01/26/2024   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/25/2024   Depression 01/24/2024   Hypertensive urgency 01/24/2024   Hypotension 12/01/2023   Altered mental status 11/30/2023   ESRD (end stage renal disease) on dialysis (HCC) 11/30/2023   Right knee pain 10/24/2023   Nonimmune to hepatitis B virus 10/22/2023   Closed fracture of right hip (HCC) 10/15/2023   T2DM (type 2 diabetes mellitus) (HCC) 10/14/2023   Urinary retention 10/14/2023   Closed intertrochanteric fracture of right hip (HCC) 10/13/2023   Patellar tendinitis 10/12/2023    Wrist sprain, left, initial encounter 10/12/2023   Contracture, 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   Mucosal abnormality of esophagus 02/18/2023   Foot pain, left 02/17/2023   SOB (shortness of breath) 02/16/2023   History of esophagitis 02/16/2023   Tachycardia 02/15/2023   Allergic rhinitis 11/07/2022   Chronic kidney disease, stage 5 (HCC) 09/11/2022   Tobacco dependence 09/11/2022   GERD with esophagitis 09/02/2021   Acute kidney injury superimposed on CKD (HCC) 04/10/2021   Achilles tendon contracture, left 03/03/2018   Non-pressure chronic ulcer of other part of left foot limited to breakdown of skin (HCC) 02/02/2018   History of right below knee amputation (HCC) 12/22/2017   Acute posthemorrhagic anemia    Falls 11/24/2017   Severe hypertension 11/24/2017   Anemia 11/24/2017   Uncontrolled type 2 diabetes mellitus with hyperglycemia, with long-term current use of insulin (HCC) 10/11/2017   ARF (acute renal failure) (HCC) 10/11/2017    PCP: [name], MD  REFERRING PROVIDER: Nadara Mustard, MD  REFERRING DIAG: [id number] (ICD-10-CM) - Chronic pain of right knee  THERAPY DIAG:  Difficulty in walking, not elsewhere classified  Unsteadiness on feet  Repeated falls  Muscle weakness (generalized)  Chronic pain of right knee  Rationale for Evaluation and Treatment: Rehabilitation  ONSET DATE: 09/2023   SUBJECTIVE:   SUBJECTIVE STATEMENT: [name] [name] fell at a gas station in November of 2024.  This resulted in a fracture of his right hip.  His right knee has been extremely painful since that time.  Although [name] [name] has not fallen since that time, he has had 4 falls over the previous 6 months.  PERTINENT HISTORY: Type 2 diabetes, HLD, HTN, peripheral artery disease, Rt below knee amputation, left toe amputation, end stage renal disease, right hip fracture in 09/2023  PAIN:  Are you having pain? Yes: NPRS scale: 2-10/10 this week Pain location: Super-medial knee and above the  knee cap Pain description: Sharp, worsens with increased weight-bearing Aggravating factors: Standing and walking, stand to sit transition, knee flexion and hip flexion Relieving factors: Getting off the knee  PRECAUTIONS: None  RED FLAGS: None   WEIGHT BEARING RESTRICTIONS:  Asher Muir notes increased WB increases symptoms  FALLS:  Has patient fallen in last 6 months?  Yes, cause of initial injury but no falls since that time.  LIVING ENVIRONMENT: Lives with: lives with their family Lives in: Mobile home Stairs:  Needs handrails Has following equipment at home: Single point cane, Environmental consultant - 4 wheeled, Wheelchair (manual), shower chair, and shower bars  OCCUPATION: Disability, drive, racing  PLOF:  Had 3 falls previous before the fall that resulted in a fracture   PATIENT GOALS: Be able to walk safely without the walker and with less knee pain  NEXT MD VISIT: 04/03/2024  OBJECTIVE:  Note: Objective measures were completed at Evaluation unless otherwise noted.  DIAGNOSTIC FINDINGS: 1. No meniscal or ligamentous injury of the right knee. 2. Mild osseous contusion of the posteromedial tibial plateau. 3.  Severe edema in infrapatellar Hoffa's fat likely reflecting a contusion secondary to direct trauma.  PATIENT SURVEYS:  Patient-Specific Activity Scoring Scheme  "0" represents "unable to perform." "10" represents "able to perform at prior level. 0 1 2 3 4 5 6 7 8 9  10 (Date and Score)   Activity 02/04/2024    1. Walking  0    2. Running  0    3. Bending Down 0   4.    5.    Score 0    Total score = sum of the activity scores/number of activities Minimum detectable change (90%CI) for average score = 2 points Minimum detectable change (90%CI) for single activity score = 3 points    COGNITION: Overall cognitive status: Within functional limits for tasks assessed     SENSATION: Diabetic neuropathy of the left foot and some tingling above the right knee  EDEMA:  Halley  notes swelling his left side but he gets off the right side before swelling is an issue   LOWER EXTREMITY ROM:  Active ROM Left/Right 02/04/2024   Hip flexion    Hip extension    Hip abduction    Hip adduction    Hip internal rotation    Hip external rotation    Knee flexion 140/118   Knee extension 0/0   Ankle dorsiflexion    Ankle plantarflexion    Ankle inversion    Ankle eversion     (Blank rows = not tested)  LOWER EXTREMITY STRENGTH:  Strength was assessed using circumferential thigh girth 15 cm proximal to the superior patellar pole Left/Right 02/04/2024   Hip flexion    Hip extension    Hip abduction    Hip adduction    Hip internal rotation    Hip external rotation    Knee flexion    Knee extension 48.5 cm/47.0 cm 10-15% strength deficit per centimeter of atrophy   Ankle dorsiflexion    Ankle plantarflexion    Ankle inversion    Ankle eversion     (Blank rows = not tested)  GAIT: Distance walked: 50 feet Assistive device utilized: Walker - 4 wheeled Level of assistance: Complete Independence Comments: Celeste notes he has increased knee pain immediately upon standing.  He is very limited in his standing and walking endurance as a result.                                                                                                                             TREATMENT DATE:  02/04/2024  Supine quadriceps sets with 2 pillows under his knees 10 x 5 seconds Attempted seated straight leg raises, but Cailean did not have enough quadriceps strength to perform this exercise correctly today  Functional Activities: Reviewed exam findings and day 1 home exercise program, discussed MRI results and that his current problem is likely related to atrophy given his decrease in activity since the fall   PATIENT EDUCATION:  Education details: See  above Person educated: Patient Education method: Explanation, Demonstration, Tactile cues, Verbal cues, and Handouts Education  comprehension: verbalized understanding, returned demonstration, verbal cues required, tactile cues required, and needs further education  HOME EXERCISE PROGRAM: Access Code: [email or web address] URL: [email or web address] Date: 02/04/2024 Prepared by: Pauletta Browns  Exercises - Supine Quadricep Sets  - 5 x daily - 7 x weekly - 2 sets - 10 reps - 5 second hold  ASSESSMENT:  CLINICAL IMPRESSION: Patient is a 53 y.o. male who was seen today for physical therapy evaluation and treatment for M25.561,G89.29 (ICD-10-CM) - Chronic pain of right knee.  Claudie had a fall in November 2024 that resulted in a hip fracture.  His knee pain has been present since that time.  It is very likely that Jaxtyn's right knee pain is related to the decrease in activity, strength and atrophy related to this fall and fracture.  Manual palpation of quadriceps sets shows significantly decreased right quadriceps activation as compared to the left and significant atrophy is noted of the right thigh.  Rorik was started on an appropriate home exercise program to improve quadricep strength, knee pain and allow him to improve his standing and walking endurance.  Given the 52-month history of this problem, and because of his other medical issues including severe diabetes and a below-knee amputation, Wille Celeste may require a longer than normal course of rehabilitation.  OBJECTIVE IMPAIRMENTS: Abnormal gait, cardiopulmonary status limiting activity, decreased activity tolerance, decreased balance, decreased endurance, decreased knowledge of condition, difficulty walking, decreased ROM, decreased strength, increased edema, impaired perceived functional ability, and pain.   ACTIVITY LIMITATIONS: carrying, bending, standing, squatting, stairs, transfers, and locomotion level  PARTICIPATION LIMITATIONS: community activity  PERSONAL FACTORS: Type 2 diabetes, HLD, HTN, peripheral artery disease, Rt below knee amputation, left toe amputation,  end stage renal disease, right hip fracture in 09/2023 are also affecting patient's functional outcome.   REHAB POTENTIAL: Good  CLINICAL DECISION MAKING: Stable/uncomplicated  EVALUATION COMPLEXITY: Moderate   GOALS: Goals reviewed with patient? Yes  SHORT TERM GOALS: Target date: 03/03/2024 Whitney will be independent with his day 1 home exercise program Baseline: Started 02/04/2024 Goal status: INITIAL  2.  Improve right quadriceps strength as assessed by improved quadriceps activation as assessed manually by the physical therapist during a quadriceps set exercise Baseline: Poor activation at evaluation Goal status: INITIAL   LONG TERM GOALS: Target date: 03/31/2024  Improve patient specific functional scale to at least 50% for walking, running and knee flexion Baseline: 0% Goal status: INITIAL  2.  [place] will report right knee pain no greater than 3 out of 10 on the visual analog scale Baseline: As high as 10 out of 10 Goal status: INITIAL  3.  Ewing will be able to complete a set of 10 consecutive seated straight leg raises, demonstrating improved quadriceps strength Baseline: Unable at evaluation Goal status: INITIAL  4.  Author will report improved standing and walking endurance as compared to evaluation Baseline: Significantly increased right knee pain immediately upon standing which severely limits standing and walking endurance Goal status: INITIAL  5.  Doris will be independent with his long-term maintenance home exercise program Baseline: Started 02/04/2024 Goal status: INITIAL  PLAN:  PT FREQUENCY: 1-2x/week  PT DURATION: 8 weeks  PLANNED INTERVENTIONS: 97110-Therapeutic exercises, 97530- Therapeutic activity, 97112- Neuromuscular re-education, 97535- Self Care, 44010- Manual therapy, [phone]- Gait training, [phone]- Prosthetic training, [phone]- Vasopneumatic device, Patient/Family education, Balance training, Stair training, and Cryotherapy  PLAN FOR NEXT SESSION:  Emphasis on quadriceps strengthening to  improve Juston's comfort and endurance with standing and walking.  Right now, Clifford's biggest concern is knee pain that presents immediately upon standing that is likely related to his very poor right quadriceps strength.   Cherlyn Cushing, PT, MPT 02/04/2024, 5:19 PM

## 2024-02-05 ENCOUNTER — Other Ambulatory Visit: Payer: Self-pay | Admitting: Student

## 2024-02-05 DIAGNOSIS — N2581 Secondary hyperparathyroidism of renal origin: Secondary | ICD-10-CM | POA: Diagnosis not present

## 2024-02-05 DIAGNOSIS — Z992 Dependence on renal dialysis: Secondary | ICD-10-CM | POA: Diagnosis not present

## 2024-02-05 DIAGNOSIS — T8249XA Other complication of vascular dialysis catheter, initial encounter: Secondary | ICD-10-CM | POA: Diagnosis not present

## 2024-02-05 DIAGNOSIS — D509 Iron deficiency anemia, unspecified: Secondary | ICD-10-CM | POA: Diagnosis not present

## 2024-02-05 DIAGNOSIS — D631 Anemia in chronic kidney disease: Secondary | ICD-10-CM | POA: Diagnosis not present

## 2024-02-05 DIAGNOSIS — Z23 Encounter for immunization: Secondary | ICD-10-CM | POA: Diagnosis not present

## 2024-02-05 DIAGNOSIS — R112 Nausea with vomiting, unspecified: Secondary | ICD-10-CM

## 2024-02-05 DIAGNOSIS — N186 End stage renal disease: Secondary | ICD-10-CM | POA: Diagnosis not present

## 2024-02-05 DIAGNOSIS — D689 Coagulation defect, unspecified: Secondary | ICD-10-CM | POA: Diagnosis not present

## 2024-02-05 DIAGNOSIS — E1122 Type 2 diabetes mellitus with diabetic chronic kidney disease: Secondary | ICD-10-CM | POA: Diagnosis not present

## 2024-02-08 DIAGNOSIS — D689 Coagulation defect, unspecified: Secondary | ICD-10-CM | POA: Diagnosis not present

## 2024-02-08 DIAGNOSIS — Z992 Dependence on renal dialysis: Secondary | ICD-10-CM | POA: Diagnosis not present

## 2024-02-08 DIAGNOSIS — E1122 Type 2 diabetes mellitus with diabetic chronic kidney disease: Secondary | ICD-10-CM | POA: Diagnosis not present

## 2024-02-08 DIAGNOSIS — T8249XA Other complication of vascular dialysis catheter, initial encounter: Secondary | ICD-10-CM | POA: Diagnosis not present

## 2024-02-08 DIAGNOSIS — Z23 Encounter for immunization: Secondary | ICD-10-CM | POA: Diagnosis not present

## 2024-02-08 DIAGNOSIS — N2581 Secondary hyperparathyroidism of renal origin: Secondary | ICD-10-CM | POA: Diagnosis not present

## 2024-02-08 DIAGNOSIS — D509 Iron deficiency anemia, unspecified: Secondary | ICD-10-CM | POA: Diagnosis not present

## 2024-02-08 DIAGNOSIS — N186 End stage renal disease: Secondary | ICD-10-CM | POA: Diagnosis not present

## 2024-02-08 DIAGNOSIS — D631 Anemia in chronic kidney disease: Secondary | ICD-10-CM | POA: Diagnosis not present

## 2024-02-10 ENCOUNTER — Other Ambulatory Visit: Payer: Self-pay | Admitting: Student

## 2024-02-10 DIAGNOSIS — Z992 Dependence on renal dialysis: Secondary | ICD-10-CM | POA: Diagnosis not present

## 2024-02-10 DIAGNOSIS — D689 Coagulation defect, unspecified: Secondary | ICD-10-CM | POA: Diagnosis not present

## 2024-02-10 DIAGNOSIS — E118 Type 2 diabetes mellitus with unspecified complications: Secondary | ICD-10-CM | POA: Diagnosis not present

## 2024-02-10 DIAGNOSIS — T8249XA Other complication of vascular dialysis catheter, initial encounter: Secondary | ICD-10-CM | POA: Diagnosis not present

## 2024-02-10 DIAGNOSIS — E1122 Type 2 diabetes mellitus with diabetic chronic kidney disease: Secondary | ICD-10-CM | POA: Diagnosis not present

## 2024-02-10 DIAGNOSIS — D509 Iron deficiency anemia, unspecified: Secondary | ICD-10-CM | POA: Diagnosis not present

## 2024-02-10 DIAGNOSIS — Z23 Encounter for immunization: Secondary | ICD-10-CM | POA: Diagnosis not present

## 2024-02-10 DIAGNOSIS — N186 End stage renal disease: Secondary | ICD-10-CM | POA: Diagnosis not present

## 2024-02-10 DIAGNOSIS — N2581 Secondary hyperparathyroidism of renal origin: Secondary | ICD-10-CM | POA: Diagnosis not present

## 2024-02-10 DIAGNOSIS — D631 Anemia in chronic kidney disease: Secondary | ICD-10-CM | POA: Diagnosis not present

## 2024-02-10 DIAGNOSIS — Z794 Long term (current) use of insulin: Secondary | ICD-10-CM | POA: Diagnosis not present

## 2024-02-12 ENCOUNTER — Other Ambulatory Visit: Payer: Self-pay | Admitting: Student

## 2024-02-12 DIAGNOSIS — D631 Anemia in chronic kidney disease: Secondary | ICD-10-CM | POA: Diagnosis not present

## 2024-02-12 DIAGNOSIS — N186 End stage renal disease: Secondary | ICD-10-CM | POA: Diagnosis not present

## 2024-02-12 DIAGNOSIS — Z23 Encounter for immunization: Secondary | ICD-10-CM | POA: Diagnosis not present

## 2024-02-12 DIAGNOSIS — T8249XA Other complication of vascular dialysis catheter, initial encounter: Secondary | ICD-10-CM | POA: Diagnosis not present

## 2024-02-12 DIAGNOSIS — D689 Coagulation defect, unspecified: Secondary | ICD-10-CM | POA: Diagnosis not present

## 2024-02-12 DIAGNOSIS — E1122 Type 2 diabetes mellitus with diabetic chronic kidney disease: Secondary | ICD-10-CM | POA: Diagnosis not present

## 2024-02-12 DIAGNOSIS — N2581 Secondary hyperparathyroidism of renal origin: Secondary | ICD-10-CM | POA: Diagnosis not present

## 2024-02-12 DIAGNOSIS — D509 Iron deficiency anemia, unspecified: Secondary | ICD-10-CM | POA: Diagnosis not present

## 2024-02-12 DIAGNOSIS — Z992 Dependence on renal dialysis: Secondary | ICD-10-CM | POA: Diagnosis not present

## 2024-02-14 DIAGNOSIS — Z89511 Acquired absence of right leg below knee: Secondary | ICD-10-CM | POA: Diagnosis not present

## 2024-02-15 DIAGNOSIS — N186 End stage renal disease: Secondary | ICD-10-CM | POA: Diagnosis not present

## 2024-02-15 DIAGNOSIS — Z23 Encounter for immunization: Secondary | ICD-10-CM | POA: Diagnosis not present

## 2024-02-15 DIAGNOSIS — D689 Coagulation defect, unspecified: Secondary | ICD-10-CM | POA: Diagnosis not present

## 2024-02-15 DIAGNOSIS — E1122 Type 2 diabetes mellitus with diabetic chronic kidney disease: Secondary | ICD-10-CM | POA: Diagnosis not present

## 2024-02-15 DIAGNOSIS — N2581 Secondary hyperparathyroidism of renal origin: Secondary | ICD-10-CM | POA: Diagnosis not present

## 2024-02-15 DIAGNOSIS — Z992 Dependence on renal dialysis: Secondary | ICD-10-CM | POA: Diagnosis not present

## 2024-02-15 DIAGNOSIS — T8249XA Other complication of vascular dialysis catheter, initial encounter: Secondary | ICD-10-CM | POA: Diagnosis not present

## 2024-02-15 DIAGNOSIS — D509 Iron deficiency anemia, unspecified: Secondary | ICD-10-CM | POA: Diagnosis not present

## 2024-02-15 DIAGNOSIS — D631 Anemia in chronic kidney disease: Secondary | ICD-10-CM | POA: Diagnosis not present

## 2024-02-16 ENCOUNTER — Telehealth: Payer: Self-pay | Admitting: Surgery

## 2024-02-16 ENCOUNTER — Ambulatory Visit (INDEPENDENT_AMBULATORY_CARE_PROVIDER_SITE_OTHER): Admitting: Physical Therapy

## 2024-02-16 ENCOUNTER — Ambulatory Visit (HOSPITAL_BASED_OUTPATIENT_CLINIC_OR_DEPARTMENT_OTHER)

## 2024-02-16 ENCOUNTER — Encounter: Payer: Self-pay | Admitting: Physical Therapy

## 2024-02-16 ENCOUNTER — Ambulatory Visit: Payer: 59 | Admitting: Student

## 2024-02-16 VITALS — BP 118/58 | HR 63 | Ht 77.0 in | Wt 238.6 lb

## 2024-02-16 DIAGNOSIS — I10 Essential (primary) hypertension: Secondary | ICD-10-CM | POA: Diagnosis not present

## 2024-02-16 DIAGNOSIS — R5383 Other fatigue: Secondary | ICD-10-CM | POA: Diagnosis not present

## 2024-02-16 DIAGNOSIS — R262 Difficulty in walking, not elsewhere classified: Secondary | ICD-10-CM

## 2024-02-16 DIAGNOSIS — M7989 Other specified soft tissue disorders: Secondary | ICD-10-CM

## 2024-02-16 DIAGNOSIS — M6281 Muscle weakness (generalized): Secondary | ICD-10-CM

## 2024-02-16 DIAGNOSIS — G8929 Other chronic pain: Secondary | ICD-10-CM

## 2024-02-16 DIAGNOSIS — R112 Nausea with vomiting, unspecified: Secondary | ICD-10-CM

## 2024-02-16 DIAGNOSIS — T148XXA Other injury of unspecified body region, initial encounter: Secondary | ICD-10-CM

## 2024-02-16 DIAGNOSIS — M25561 Pain in right knee: Secondary | ICD-10-CM | POA: Diagnosis not present

## 2024-02-16 DIAGNOSIS — R2681 Unsteadiness on feet: Secondary | ICD-10-CM

## 2024-02-16 DIAGNOSIS — R269 Unspecified abnormalities of gait and mobility: Secondary | ICD-10-CM

## 2024-02-16 DIAGNOSIS — R296 Repeated falls: Secondary | ICD-10-CM

## 2024-02-16 LAB — GLUCOSE, POCT (MANUAL RESULT ENTRY): POC Glucose: 188 mg/dL — AB (ref 70–99)

## 2024-02-16 MED ORDER — LOSARTAN POTASSIUM 25 MG PO TABS: 25.0000 mg | ORAL_TABLET | Freq: Every day | ORAL | 0 refills | Status: DC

## 2024-02-16 MED ORDER — PANTOPRAZOLE SODIUM 40 MG PO TBEC
40.0000 mg | DELAYED_RELEASE_TABLET | Freq: Every day | ORAL | 0 refills | Status: DC
Start: 2024-02-16 — End: 2024-05-11

## 2024-02-16 NOTE — Telephone Encounter (Signed)
 Pt appointments scheduled

## 2024-02-16 NOTE — Assessment & Plan Note (Signed)
-  Managed with zofran PRN

## 2024-02-16 NOTE — Progress Notes (Signed)
    SUBJECTIVE:   CHIEF COMPLAINT / HPI: Hospital fu  Admitted 3/1 to 3/5 for intractable vomiting/abdominal pain and acute hypertension   Please repeat BMP, losartan 25 mg newly started shortly before discharge.  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 Carvedilol discontinued in favor of metoprolol, did not want 2 beta blockers. Hepatitis B nonimmune in setting of HD, vaccine booster refused inpatient, please follow up and discuss recommendation. Ensure GI follow up. Outpatient PT recommended by PT, consider referral.   Discussed the use of AI scribe software for clinical note transcription with the patient, who gave verbal consent to proceed.  History of Present Illness The patient reports that his blood pressure readings at home are higher than those recorded in the clinic. The patient denies feeling dizzy or lightheaded despite the lower blood pressure readings. The patient also reports occasional nausea and vomiting, particularly in the mornings. The patient is unsure if these episodes are related to his dialysis days.  The patient also reports persistent knee pain, despite receiving two steroid injections. The patient is currently attending physical therapy for this issue. The patient also mentions a large, painless bruise on his back, the cause of which is unknown. The patient denies any falls or injuries that could have caused the bruise.  The patient also mentions that he is running low on his gabapentin prescription and that his pharmacy has indicated it is too early for a refill. The patient requests a 90-day prescription for future refills.  At end of visit patient mentions that he has been having a left hand swelling and dorsal hand pain for the past month after left AV fistula creation on 3/5.  Denies any coolness, numbness at this area.   PERTINENT  PMH / PSH: ESRD TTS  OBJECTIVE:   BP (!) 118/58    Pulse 63   Ht 6\' 5"  (1.956 m)   Wt 238 lb 9.6 oz (108.2 kg)   SpO2 97%   BMI 28.29 kg/m   General: Well appearing, NAD, awake, alert, responsive to questions Head: Normocephalic atraumatic CV: Regular rate and rhythm no murmurs rubs or gallops Respiratory: Clear to ausculation bilaterally, no wheezes rales or crackles, chest rises symmetrically,  no increased work of breathing Abdomen: Soft, non-tender, non-distended, normoactive bowel sounds  Extremities: LUE with left hand dorsal swelling and tenderness to palpation, 2+ radial pulses, AV fistula with thrill, well perfused extremity, no coolness  ASSESSMENT/PLAN:   Assessment & Plan Essential hypertension Hypertension managed with losartan. Normotensive today, not lightheaded - BMP - Continue losartan  Swelling of left hand Called and spoke with on-call vascular surgeon Dr. Myra Gianotti about patient's issue.  Initial plan was for DVT ultrasound, he states this is unlikely to be cause.  He states this is most likely outflow issue/stenosis causing the fluid retention in the left hand. Consider steal syndrome as well. States that he would recommend close vascular surgery follow-up in clinic and they will decide which imaging test to order. -Sent staff message to Dr. Myra Gianotti about getting patient scheduled -Pt informed to keep cellphone on him, ED precautions for steal syndrome discussed with him Bruising Possibly d/t platelet dysfunction in ESRD -CBC Other fatigue POC glucose appropriate, likely mood contributing as well -Monitor Nausea and vomiting, unspecified vomiting type -Managed with zofran PRN  [name], MD [facility]

## 2024-02-16 NOTE — Therapy (Addendum)
 OUTPATIENT PHYSICAL THERAPY LOWER EXTREMITY TREATEMENT   Patient Name: [name] [name] MRN: [id number] DOB:1971-06-23, 53 y.o., male Today's Date: 02/16/2024  END OF SESSION:  PT End of Session - 02/16/24 1430     Visit Number 2    Number of Visits 16    Date for PT Re-Evaluation 03/31/24    Authorization Type [employer] dual complete    Progress Note Due on Visit 16    PT Start Time 1430    PT Stop Time 1512    PT Time Calculation (min) 42 min    Activity Tolerance Patient tolerated treatment well;No increased pain;Patient limited by pain    Behavior During Therapy [facility] for tasks assessed/performed             Past Medical History:  Diagnosis Date   Abdominal pain 11/30/2023   Abdominal tenderness 10/14/2023   Allergy    seasonal and environmental   Anemia    Chest pain 12/01/2023   Chronic constipation 05/13/2020   Depression    Diabetes mellitus without complication (HCC) 2019   Dyspnea    hx - SOB with exertion   ESRD on hemodialysis (HCC)    Tues, Thurs, Sat   GERD (gastroesophageal reflux disease)    Hyperlipidemia    Hypertension    Necrotizing fasciitis (HCC) 10/13/2017   Neuromuscular disorder (HCC)    neuropathy   PAD (peripheral artery disease) (HCC)    Secondary hyperparathyroidism (HCC)    SVT (supraventricular tachycardia) (HCC)    hx   Wound dehiscence 11/24/2017   Past Surgical History:  Procedure Laterality Date   AMPUTATION Right 10/13/2017   Procedure: RIGHT BELOW KNEE AMPUTATION;  Surgeon: Nadara Mustard, MD;  Location: [facility] OR;  Service: Orthopedics;  Laterality: Right;   AMPUTATION Right 11/24/2017   Procedure: AMPUTATION BELOW KNEE REVISION;  Surgeon: Nadara Mustard, MD;  Location: [facility] OR;  Service: Orthopedics;  Laterality: Right;   AMPUTATION TOE Left    AV FISTULA PLACEMENT Left 10/19/2023   Procedure: LEFT BRACHIOCEPHALIC ARTERIOVENOUS (AV) FISTULA CREATION;  Surgeon: Daria Pastures, MD;  Location: [facility] OR;  Service: Vascular;   Laterality: Left;   AV FISTULA PLACEMENT Left 01/26/2024   Procedure: LEFT ARTERIOVENOUS (AV) FISTULA CREATION;  Surgeon: Daria Pastures, MD;  Location: [facility] OR;  Service: Vascular;  Laterality: Left;   BIOPSY  09/02/2021   Procedure: BIOPSY;  Surgeon: Imogene Burn, MD;  Location: [facility] ENDOSCOPY;  Service: Gastroenterology;;   BIOPSY  02/18/2023   Procedure: BIOPSY;  Surgeon: Benancio Deeds, MD;  Location: [facility] ENDOSCOPY;  Service: Gastroenterology;;   COLONOSCOPY  05/11/2019   COLONOSCOPY  2021   3 polyps - tubular adenomas   ESOPHAGOGASTRODUODENOSCOPY (EGD) WITH PROPOFOL N/A 09/02/2021   Procedure: ESOPHAGOGASTRODUODENOSCOPY (EGD) WITH PROPOFOL;  Surgeon: Imogene Burn, MD;  Location: [facility] ENDOSCOPY;  Service: Gastroenterology;  Laterality: N/A;   ESOPHAGOGASTRODUODENOSCOPY (EGD) WITH PROPOFOL N/A 02/18/2023   Procedure: ESOPHAGOGASTRODUODENOSCOPY (EGD) WITH PROPOFOL;  Surgeon: Benancio Deeds, MD;  Location: [facility] ENDOSCOPY;  Service: Gastroenterology;  Laterality: N/A;   INTRAMEDULLARY (IM) NAIL INTERTROCHANTERIC Right 10/15/2023   Procedure: INTRAMEDULLARY (IM) NAIL INTERTROCHANTERIC;  Surgeon: Roby Lofts, MD;  Location: [facility] OR;  Service: Orthopedics;  Laterality: Right;   IR FLUORO GUIDE CV LINE RIGHT  10/15/2023   IR US GUIDE VASC ACCESS RIGHT  10/15/2023   POLYPECTOMY     WISDOM TOOTH EXTRACTION     Patient Active Problem List   Diagnosis Date Noted   Nausea  and vomiting 01/26/2024   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/25/2024   Depression 01/24/2024   Hypertensive urgency 01/24/2024   Hypotension 12/01/2023   Altered mental status 11/30/2023   ESRD (end stage renal disease) on dialysis (HCC) 11/30/2023   Right knee pain 10/24/2023   Nonimmune to hepatitis B virus 10/22/2023   Closed fracture of right hip (HCC) 10/15/2023   T2DM (type 2 diabetes mellitus) (HCC) 10/14/2023   Urinary retention 10/14/2023   Closed intertrochanteric fracture of right hip (HCC) 10/13/2023   Patellar  tendinitis 10/12/2023   Wrist sprain, left, initial encounter 10/12/2023   Contracture, 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   Mucosal abnormality of esophagus 02/18/2023   Foot pain, left 02/17/2023   SOB (shortness of breath) 02/16/2023   History of esophagitis 02/16/2023   Tachycardia 02/15/2023   Allergic rhinitis 11/07/2022   Chronic kidney disease, stage 5 (HCC) 09/11/2022   Tobacco dependence 09/11/2022   GERD with esophagitis 09/02/2021   Acute kidney injury superimposed on CKD (HCC) 04/10/2021   Achilles tendon contracture, left 03/03/2018   Non-pressure chronic ulcer of other part of left foot limited to breakdown of skin (HCC) 02/02/2018   History of right below knee amputation (HCC) 12/22/2017   Acute posthemorrhagic anemia    Falls 11/24/2017   Severe hypertension 11/24/2017   Anemia 11/24/2017   Uncontrolled type 2 diabetes mellitus with hyperglycemia, with long-term current use of insulin (HCC) 10/11/2017   ARF (acute renal failure) (HCC) 10/11/2017    PCP: [name], MD  REFERRING PROVIDER: Nadara Mustard, MD  REFERRING DIAG: [phone] (ICD-10-CM) - Chronic pain of right knee  THERAPY DIAG:  Difficulty in walking, not elsewhere classified  Unsteadiness on feet  Repeated falls  Muscle weakness (generalized)  Chronic pain of right knee  Abnormality of gait and mobility  Rationale for Evaluation and Treatment: Rehabilitation  ONSET DATE: 09/2023   SUBJECTIVE:   SUBJECTIVE STATEMENT: He has been doing the exercises. He saw prosthetist, Scarlette Slice, this morning who issued 2 new liners.   PERTINENT HISTORY: Type 2 diabetes, HLD, HTN, peripheral artery disease, Rt below knee amputation, left toe amputation, end stage renal disease, right hip fracture in 09/2023  PAIN:  Are you having pain? Yes: NPRS scale: today 0/10 and up to 10/10 this week Pain location: Super-medial knee and above the knee cap Pain description: Sharp, worsens with increased  weight-bearing Aggravating factors: Standing and walking, stand to sit transition, knee flexion and hip flexion Relieving factors: Getting off the knee  PRECAUTIONS:  NO BP LUE, fall risk  RED FLAGS: None   WEIGHT BEARING RESTRICTIONS:  Remington notes increased WB increases symptoms  FALLS:  Has patient fallen in last 6 months?  Yes, cause of initial injury but no falls since that time.  LIVING ENVIRONMENT: Lives with: lives with their family Lives in: Mobile home Stairs:  Needs handrails Has following equipment at home: Single point cane, Environmental consultant - 4 wheeled, Wheelchair (manual), shower chair, and shower bars  OCCUPATION: Disability, drive, racing  PLOF:  Had 3 falls previous before the fall that resulted in a fracture   PATIENT GOALS: Be able to walk safely without the walker and with less knee pain  NEXT MD VISIT: 04/03/2024  OBJECTIVE:  Note: Objective measures were completed at Evaluation unless otherwise noted.  DIAGNOSTIC FINDINGS: 1. No meniscal or ligamentous injury of the right knee. 2. Mild osseous contusion of the posteromedial tibial plateau. 3. Severe edema in infrapatellar Hoffa's fat likely reflecting a contusion secondary to direct  trauma.  PATIENT SURVEYS:  Patient-Specific Activity Scoring Scheme  "0" represents "unable to perform." "10" represents "able to perform at prior level. 0 1 2 3 4 5 6 7 8 9  10 (Date and Score)   Activity 02/16/2024    1. Walking with cane in community  0    2. Walking in house without device  0    3. Picking up items from floor 0   4.    5.    Score 0    Total score = sum of the activity scores/number of activities Minimum detectable change (90%CI) for average score = 2 points Minimum detectable change (90%CI) for single activity score = 3 points    COGNITION: Overall cognitive status: Within functional limits for tasks assessed     SENSATION: Diabetic neuropathy of the left foot and some tingling above the right  knee  EDEMA:  Jajuan notes swelling his left side but he gets off the right side before swelling is an issue  LOWER EXTREMITY ROM:  Active ROM Left/Right 02/04/2024   Hip flexion    Hip extension    Hip abduction    Hip adduction    Hip internal rotation    Hip external rotation    Knee flexion 140/118   Knee extension 0/0   Ankle dorsiflexion    Ankle plantarflexion    Ankle inversion    Ankle eversion     (Blank rows = not tested)  LOWER EXTREMITY STRENGTH:  Strength was assessed using circumferential thigh girth 15 cm proximal to the superior patellar pole Left/Right 02/04/2024   Hip flexion    Hip extension    Hip abduction    Hip adduction    Hip internal rotation    Hip external rotation    Knee flexion    Knee extension 48.5 cm/47.0 cm 10-15% strength deficit per centimeter of atrophy   Ankle dorsiflexion    Ankle plantarflexion    Ankle inversion    Ankle eversion     (Blank rows = not tested)  GAIT: Evaluation on 02/04/2024: Distance walked: 50 feet Assistive device utilized: Environmental consultant - 4 wheeled Level of assistance: Complete Independence Comments: [place] notes he has increased knee pain immediately upon standing.  He is very limited in his standing and walking endurance as a result.  Prosthetic Wear & Care: 02/16/2024:  He is wearing prosthesis from arising to bedtime.   No skin issues noted.                                                                                                                             TREATMENT DATE:  02/16/2024  Therapeutic Exercise: Nustep seat 15 level 5 with BLEs & BUEs for 8 min Seated LAQ alternating LEs 5 sec hold 10 reps 2 sets Seated hamstring curl RLE red Theraband with assist concentric for full range & controlled eccentric 10 reps 2 sets; LLE green theraband 10 reps 2 sets.  Standing BUE support on locked rollator walker: 10 reps ea alternating hip flexion SLR, abduction / stepping laterally and hip  ext  Self-Care: BP 170/95 HR 84  Pt reported light headed.  He also reported that BP this morning at doctor was 110/70 something. He reports that he has been on dialysis since Oct. 2024.  He sleeps for 5-8 hours after dialysis.  He reports that he is not compliant with fluid restrictions.  PT verbally educated on "dry weight" ,  when he weighs into dialysis they use amount that he is over his dry weight to determine how much fluid needs to be pulled off.  They typically pull the fluid off in same amount of time so if more fluid, then they pull "harder"  Dialysis can lead to muscle weakness and fatigue issues.  When his fluid levels vary a lot this can make recovery from muscle issues harder and slower.  Pt verbalized understanding.     TREATMENT DATE:  02/04/2024  Supine quadriceps sets with 2 pillows under his knees 10 x 5 seconds Attempted seated straight leg raises, but Cayton did not have enough quadriceps strength to perform this exercise correctly today  Functional Activities: Reviewed exam findings and day 1 home exercise program, discussed MRI results and that his current problem is likely related to atrophy given his decrease in activity since the fall   PATIENT EDUCATION:  Education details: See above Person educated: Patient Education method: Explanation, Demonstration, Tactile cues, Verbal cues, and Handouts Education comprehension: verbalized understanding, returned demonstration, verbal cues required, tactile cues required, and needs further education  HOME EXERCISE PROGRAM: Access Code: [email or web address] URL: [email or web address] Date: 02/04/2024 Prepared by: Pauletta Browns  Exercises - Supine Quadricep Sets  - 5 x daily - 7 x weekly - 2 sets - 10 reps - 5 second hold  ASSESSMENT:  CLINICAL IMPRESSION: Patient appears significantly deconditioned from chronic health issues especially dialysis and limited mobility.  Patient tolerated therapeutic exercise but needed  frequent rests.   He appears to understand PT instruction regarding dialysis and weakness / fatigue.    Evaluation on 02/04/2024:  Patient is a 53 y.o. male who was seen today for physical therapy evaluation and treatment for M25.561,G89.29 (ICD-10-CM) - Chronic pain of right knee.  Michiah had a fall in November 2024 that resulted in a hip fracture.  His knee pain has been present since that time.  It is very likely that Welden's right knee pain is related to the decrease in activity, strength and atrophy related to this fall and fracture.  Manual palpation of quadriceps sets shows significantly decreased right quadriceps activation as compared to the left and significant atrophy is noted of the right thigh.  Louay was started on an appropriate home exercise program to improve quadricep strength, knee pain and allow him to improve his standing and walking endurance.  Given the 100-month history of this problem, and because of his other medical issues including severe diabetes and a below-knee amputation, Wille Celeste may require a longer than normal course of rehabilitation.  OBJECTIVE IMPAIRMENTS: Abnormal gait, cardiopulmonary status limiting activity, decreased activity tolerance, decreased balance, decreased endurance, decreased knowledge of condition, difficulty walking, decreased ROM, decreased strength, increased edema, impaired perceived functional ability, and pain.   ACTIVITY LIMITATIONS: carrying, bending, standing, squatting, stairs, transfers, and locomotion level  PARTICIPATION LIMITATIONS: community activity  PERSONAL FACTORS: Type 2 diabetes, HLD, HTN, peripheral artery disease, Rt below knee amputation, left toe amputation, end stage renal disease, right hip fracture in  09/2023 are also affecting patient's functional outcome.   REHAB POTENTIAL: Good  CLINICAL DECISION MAKING: Stable/uncomplicated  EVALUATION COMPLEXITY: Moderate   GOALS: Goals reviewed with patient? Yes  SHORT TERM GOALS:  Target date: 03/03/2024 Zed will be independent with his day 1 home exercise program Baseline: Started 02/04/2024 Goal status: Ongoing 02/16/2024  2.  Improve right quadriceps strength as assessed by improved quadriceps activation as assessed manually by the physical therapist during a quadriceps set exercise Baseline: Poor activation at evaluation Goal status: Ongoing 02/16/2024   LONG TERM GOALS: Target date: 03/31/2024  Improve patient specific functional scale to at least 50% for walking in home & community and picking up items Baseline: 0% Goal status: Ongoing 02/16/2024  2.  Koleton will report right knee pain no greater than 3 out of 10 on the visual analog scale Baseline: As high as 10 out of 10 Goal status: Ongoing 02/16/2024  3.  Kealii will be able to complete a set of 10 consecutive seated straight leg raises, demonstrating improved quadriceps strength Baseline: Unable at evaluation Goal status: Ongoing 02/16/2024  4.  Darean will report improved standing and walking endurance as compared to evaluation Baseline: Significantly increased right knee pain immediately upon standing which severely limits standing and walking endurance Goal status: Ongoing 02/16/2024  5.  Rhyker will be independent with his long-term maintenance home exercise program Baseline: Started 02/04/2024 Goal status: Ongoing 02/16/2024  PLAN:  PT FREQUENCY: 1-2x/week  PT DURATION: 8 weeks  PLANNED INTERVENTIONS: 97110-Therapeutic exercises, 97530- Therapeutic activity, 97112- Neuromuscular re-education, 97535- Self Care, 74259- Manual therapy, [phone]- Gait training, [phone]- Prosthetic training, [phone]- Vasopneumatic device, Patient/Family education, Balance training, Stair training, and Cryotherapy  PLAN FOR NEXT SESSION:   check weight of prosthesis and educate on dialysis adjustment,  update HEP, continue therapeutic exercise and therapeutic activities.     Vladimir Faster, PT, DPT 02/16/2024, 3:32 PM

## 2024-02-16 NOTE — Patient Instructions (Signed)
 It was great to see you! Thank you for allowing me to participate in your care!   Our plans for today:  - We will check labs today, continue meds as you have been taking - GI follow up  Take care and seek immediate care sooner if you develop any concerns.  [name], MD

## 2024-02-17 ENCOUNTER — Ambulatory Visit (INDEPENDENT_AMBULATORY_CARE_PROVIDER_SITE_OTHER): Admitting: Physician Assistant

## 2024-02-17 ENCOUNTER — Encounter: Payer: Self-pay | Admitting: Student

## 2024-02-17 VITALS — BP 168/76 | HR 89 | Temp 99.3°F | Ht 77.0 in | Wt 238.8 lb

## 2024-02-17 DIAGNOSIS — Z992 Dependence on renal dialysis: Secondary | ICD-10-CM

## 2024-02-17 DIAGNOSIS — N2581 Secondary hyperparathyroidism of renal origin: Secondary | ICD-10-CM | POA: Diagnosis not present

## 2024-02-17 DIAGNOSIS — E1122 Type 2 diabetes mellitus with diabetic chronic kidney disease: Secondary | ICD-10-CM | POA: Diagnosis not present

## 2024-02-17 DIAGNOSIS — T8249XA Other complication of vascular dialysis catheter, initial encounter: Secondary | ICD-10-CM | POA: Diagnosis not present

## 2024-02-17 DIAGNOSIS — D689 Coagulation defect, unspecified: Secondary | ICD-10-CM | POA: Diagnosis not present

## 2024-02-17 DIAGNOSIS — N186 End stage renal disease: Secondary | ICD-10-CM

## 2024-02-17 DIAGNOSIS — Z23 Encounter for immunization: Secondary | ICD-10-CM | POA: Diagnosis not present

## 2024-02-17 DIAGNOSIS — D509 Iron deficiency anemia, unspecified: Secondary | ICD-10-CM | POA: Diagnosis not present

## 2024-02-17 DIAGNOSIS — D631 Anemia in chronic kidney disease: Secondary | ICD-10-CM | POA: Diagnosis not present

## 2024-02-17 LAB — BASIC METABOLIC PANEL WITH GFR
BUN/Creatinine Ratio: 4 — ABNORMAL LOW (ref 9–20)
BUN: 21 mg/dL (ref 6–24)
CO2: 22 mmol/L (ref 20–29)
Calcium: 7.8 mg/dL — ABNORMAL LOW (ref 8.7–10.2)
Chloride: 101 mmol/L (ref 96–106)
Creatinine, Ser: 5.71 mg/dL — ABNORMAL HIGH (ref 0.76–1.27)
Glucose: 162 mg/dL — ABNORMAL HIGH (ref 70–99)
Potassium: 4.8 mmol/L (ref 3.5–5.2)
Sodium: 136 mmol/L (ref 134–144)
eGFR: 11 mL/min/{1.73_m2} — ABNORMAL LOW (ref >59)

## 2024-02-17 LAB — CBC
Hematocrit: 31.1 % — ABNORMAL LOW (ref 37.5–51.0)
Hemoglobin: 9.7 g/dL — ABNORMAL LOW (ref 13.0–17.7)
MCH: 27.2 pg (ref 26.6–33.0)
MCHC: 31.2 g/dL — ABNORMAL LOW (ref 31.5–35.7)
MCV: 87 fL (ref 79–97)
Platelets: 118 10*3/uL — ABNORMAL LOW (ref 150–450)
RBC: 3.57 x10E6/uL — ABNORMAL LOW (ref 4.14–5.80)
RDW: 16.7 % — ABNORMAL HIGH (ref 11.6–15.4)
WBC: 6.5 10*3/uL (ref 3.4–10.8)

## 2024-02-17 NOTE — Progress Notes (Signed)
 POST OPERATIVE OFFICE NOTE    CC:  F/u for surgery  HPI:  This is a 53 y.o. male who is s/p left 1st stage BVT on 01/26/2024 by Dr. Hetty Blend.   Dialysis access hx: -left BC AVF 10/19/2023 Dr. Hetty Blend -left 1st stage BVT 01/26/2024 Dr. Hetty Blend  Pt states he has a little pain on the dorsum of the hand.  He has contracture of his fingers but this is not new and has been present for 5 years.  He states he has swelling of the left arm.  He states this has been present since the 1st stage basilic vein transposition on 01/26/2024.  He does not elevate his arm.  He has a TDC on the right internal jugular.   The pt is on dialysis T/T/S at [facility] location.   Allergies  Allergen Reactions   Iron Nausea And Vomiting    With the iron tablets   Tape Rash    Rash at site of tape--okay to use paper tape    Current Outpatient Medications  Medication Sig Dispense Refill   acetaminophen (TYLENOL) 500 MG tablet Take 2 tablets (1,000 mg total) by mouth every 6 (six) hours as needed. (Patient taking differently: Take 1,000 mg by mouth every 6 (six) hours as needed for mild pain (pain score 1-3).) 30 tablet 0   AGAMATRIX ULTRA-THIN LANCETS MISC Check blood sugars before meals twice daily 100 each 11   atorvastatin (LIPITOR) 40 MG tablet Take 1 tablet (40 mg total) by mouth at bedtime. (Patient taking differently: Take 40 mg by mouth daily.) 90 tablet 0   Blood Glucose Monitoring Suppl (ACCU-CHEK GUIDE ME) w/Device KIT See admin instructions.     calcitRIOL (ROCALTROL) 0.25 MCG capsule Take 1 capsule (0.25 mcg total) by mouth Every Tuesday,Thursday,and Saturday with dialysis. 30 capsule 0   Darbepoetin Alfa (ARANESP) 150 MCG/0.3ML SOSY injection Inject 0.3 mLs (150 mcg total) into the skin every Saturday at 6 PM. (Patient taking differently: Inject 150 mcg into the skin every Saturday at 6 PM. At dialysis)     diclofenac Sodium (VOLTAREN) 1 % GEL Apply 4 g topically 4 (four) times daily. (Patient taking  differently: Apply 4 g topically 4 (four) times daily as needed (pain).) 100 g 0   diltiazem (CARDIZEM CD) 300 MG 24 hr capsule TAKE 1 CAPSULE(300 MG) BY MOUTH DAILY 90 capsule 0   FLUoxetine (PROZAC) 20 MG capsule TAKE 1 CAPSULE(20 MG) BY MOUTH DAILY 30 capsule 0   gabapentin (NEURONTIN) 300 MG capsule TAKE 1 CAPSULE(300 MG) BY MOUTH DAILY 30 capsule 0   glucose blood (AGAMATRIX PRESTO TEST) test strip Check blood sugars twice daily before meals 100 each 12   glucose blood test strip 1 each by Other route as needed for other. Use as instructed     hydrALAZINE (APRESOLINE) 50 MG tablet Take 1 tablet (50 mg total) by mouth 2 (two) times daily. (Patient taking differently: Take 100 mg by mouth 2 (two) times daily.) 30 tablet 0   insulin lispro (HUMALOG) 100 UNIT/ML injection 0-6 Units, Subcutaneous, 3 times daily with meals, First dose on Sun 10/17/23 at 0800 Correction coverage: Very Sensitive (ESRD/Dialysis) CBG < 70: Implement Hypoglycemia Standing Orders and refer to Hypoglycemia Standing Orders sidebar report CBG 70 - 120: 0 units CBG 121 - 150: 0 units CBG 151 - 200: 1 unit CBG 201-250: 2 units CBG 251-300: 3 units CBG 301-350: 4 units CBG 351-400: 5 units CBG > 400: Give 6 units and call  MD (Patient taking differently: 0-6 Units, Subcutaneous, 3 times daily with meals, as needed  Correction coverage: Very Sensitive (ESRD/Dialysis) CBG < 70: Implement Hypoglycemia Standing Orders and refer to Hypoglycemia Standing Orders sidebar report CBG 70 - 120: 0 units CBG 121 - 150: 0 units CBG 151 - 200: 1 unit CBG 201-250: 2 units CBG 251-300: 3 units CBG 301-350: 4 units CBG 351-400: 5 units CBG > 400: Give 6 units and call MD)     lidocaine (HM LIDOCAINE PATCH) 4 % Place 1 patch onto the skin daily. (Patient taking differently: Place 1 patch onto the skin daily as needed (pain).) 30 patch 0   losartan (COZAAR) 25 MG tablet Take 1 tablet (25 mg total) by mouth daily. 90 tablet 0   meloxicam  (MOBIC) 15 MG tablet Take 15 mg by mouth daily.     metoprolol succinate (TOPROL-XL) 25 MG 24 hr tablet Take 25 mg by mouth daily.     ondansetron (ZOFRAN-ODT) 4 MG disintegrating tablet Take 1 tablet (4 mg total) by mouth every 8 (eight) hours as needed for nausea or vomiting. 20 tablet 0   oxyCODONE-acetaminophen (PERCOCET) 10-325 MG tablet Take 1 tablet by mouth every 8 (eight) hours as needed for pain.     pantoprazole (PROTONIX) 40 MG tablet Take 1 tablet (40 mg total) by mouth daily. 90 tablet 0   sevelamer carbonate (RENVELA) 800 MG tablet Take 2 tablets (1,600 mg total) by mouth 3 (three) times daily with meals. (Patient taking differently: Take 1,600 mg by mouth 2 (two) times daily with a meal.) 180 tablet 0   tamsulosin (FLOMAX) 0.4 MG CAPS capsule TAKE 1 CAPSULE(0.4 MG) BY MOUTH DAILY AFTER SUPPER (Patient taking differently: Take 0.4 mg by mouth daily as needed (bladder control).) 90 capsule 0   tiZANidine (ZANAFLEX) 4 MG tablet TAKE 1/2 TABLET(2 MG) BY MOUTH DAILY AS NEEDED FOR MUSCLE SPASMS (Patient taking differently: Take 4 mg by mouth daily as needed for muscle spasms.) 30 tablet 0   No current facility-administered medications for this visit.     ROS:  See HPI  Physical Exam:  Today's Vitals   02/17/24 1356  BP: (!) 168/76  Pulse: 89  Temp: 99.3 F (37.4 C)  TempSrc: Temporal  SpO2: 96%  Weight: 238 lb 12.8 oz (108.3 kg)  Height: 6\' 5"  (1.956 m)   Body mass index is 28.32 kg/m.  General; flat affect Incision:  healed nicely Extremities:   There is a palpable left radial pulse.   sensory is in tact.   His hand grip is limited by his hand contracture and he states this is at baseline. There is a thrill present Access is  easily palpable    Assessment/Plan:  This is a 53 y.o. male who is s/p: 1st stage BVT on 01/26/2024 by Dr. Hetty Blend.    -the pt does not have evidence of steal and he has a palpable left radial pulse.   -discussed with Dr. 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  Pt has f/u appt on 4/11 with dialysis duplex. May need to be scheduled at that time for fistulogram if he continues to have swelling.     I have wrapped his arm with 4" ace wrap from hand to upper arm and instructed him to elevate his arm.  Discussed that wrap should not be too tight  and on mild compression from hand to upper arm.     Doreatha Massed, [facility] [phone]  Clinic MD:  Karin Lieu

## 2024-02-19 DIAGNOSIS — N186 End stage renal disease: Secondary | ICD-10-CM | POA: Diagnosis not present

## 2024-02-19 DIAGNOSIS — E1122 Type 2 diabetes mellitus with diabetic chronic kidney disease: Secondary | ICD-10-CM | POA: Diagnosis not present

## 2024-02-19 DIAGNOSIS — Z992 Dependence on renal dialysis: Secondary | ICD-10-CM | POA: Diagnosis not present

## 2024-02-19 DIAGNOSIS — T8249XA Other complication of vascular dialysis catheter, initial encounter: Secondary | ICD-10-CM | POA: Diagnosis not present

## 2024-02-19 DIAGNOSIS — D689 Coagulation defect, unspecified: Secondary | ICD-10-CM | POA: Diagnosis not present

## 2024-02-19 DIAGNOSIS — N2581 Secondary hyperparathyroidism of renal origin: Secondary | ICD-10-CM | POA: Diagnosis not present

## 2024-02-19 DIAGNOSIS — Z23 Encounter for immunization: Secondary | ICD-10-CM | POA: Diagnosis not present

## 2024-02-19 DIAGNOSIS — D631 Anemia in chronic kidney disease: Secondary | ICD-10-CM | POA: Diagnosis not present

## 2024-02-19 DIAGNOSIS — D509 Iron deficiency anemia, unspecified: Secondary | ICD-10-CM | POA: Diagnosis not present

## 2024-02-21 ENCOUNTER — Ambulatory Visit (INDEPENDENT_AMBULATORY_CARE_PROVIDER_SITE_OTHER): Admitting: Physical Therapy

## 2024-02-21 ENCOUNTER — Encounter: Payer: Self-pay | Admitting: Physical Therapy

## 2024-02-21 ENCOUNTER — Other Ambulatory Visit: Payer: Self-pay

## 2024-02-21 DIAGNOSIS — R269 Unspecified abnormalities of gait and mobility: Secondary | ICD-10-CM

## 2024-02-21 DIAGNOSIS — R262 Difficulty in walking, not elsewhere classified: Secondary | ICD-10-CM

## 2024-02-21 DIAGNOSIS — N186 End stage renal disease: Secondary | ICD-10-CM | POA: Diagnosis not present

## 2024-02-21 DIAGNOSIS — M6281 Muscle weakness (generalized): Secondary | ICD-10-CM

## 2024-02-21 DIAGNOSIS — M25561 Pain in right knee: Secondary | ICD-10-CM

## 2024-02-21 DIAGNOSIS — R296 Repeated falls: Secondary | ICD-10-CM

## 2024-02-21 DIAGNOSIS — R2681 Unsteadiness on feet: Secondary | ICD-10-CM

## 2024-02-21 DIAGNOSIS — Z992 Dependence on renal dialysis: Secondary | ICD-10-CM | POA: Diagnosis not present

## 2024-02-21 DIAGNOSIS — I129 Hypertensive chronic kidney disease with stage 1 through stage 4 chronic kidney disease, or unspecified chronic kidney disease: Secondary | ICD-10-CM | POA: Diagnosis not present

## 2024-02-21 DIAGNOSIS — G8929 Other chronic pain: Secondary | ICD-10-CM

## 2024-02-21 NOTE — Therapy (Signed)
 OUTPATIENT PHYSICAL THERAPY LOWER EXTREMITY TREATMENT   Patient Name: [name] [name] MRN: [id number] DOB:18-Feb-1971, 53 y.o., male Today's Date: 02/21/2024  END OF SESSION:  PT End of Session - 02/21/24 1013     Visit Number 3    Number of Visits 16    Date for PT Re-Evaluation 03/31/24    Authorization Type [employer] dual complete no authorization needed  20% coinsurance    Authorization - Visit Number 3    Progress Note Due on Visit 10    PT Start Time 1015    PT Stop Time 1055    PT Time Calculation (min) 40 min    Activity Tolerance Patient tolerated treatment well;No increased pain;Patient limited by pain    Behavior During Therapy [name] [name] for tasks assessed/performed              Past Medical History:  Diagnosis Date   Abdominal pain 11/30/2023   Abdominal tenderness 10/14/2023   Allergy    seasonal and environmental   Anemia    Chest pain 12/01/2023   Chronic constipation 05/13/2020   Depression    Diabetes mellitus without complication (HCC) 2019   Dyspnea    hx - SOB with exertion   ESRD on hemodialysis (HCC)    Tues, Thurs, Sat   GERD (gastroesophageal reflux disease)    Hyperlipidemia    Hypertension    Necrotizing fasciitis (HCC) 10/13/2017   Neuromuscular disorder (HCC)    neuropathy   PAD (peripheral artery disease) (HCC)    Secondary hyperparathyroidism (HCC)    SVT (supraventricular tachycardia) (HCC)    hx   Wound dehiscence 11/24/2017   Past Surgical History:  Procedure Laterality Date   AMPUTATION Right 10/13/2017   Procedure: RIGHT BELOW KNEE AMPUTATION;  Surgeon: Nadara Mustard, MD;  Location: [facility] OR;  Service: Orthopedics;  Laterality: Right;   AMPUTATION Right 11/24/2017   Procedure: AMPUTATION BELOW KNEE REVISION;  Surgeon: Nadara Mustard, MD;  Location: [name] OR;  Service: Orthopedics;  Laterality: Right;   AMPUTATION TOE Left    AV FISTULA PLACEMENT Left 10/19/2023   Procedure: LEFT BRACHIOCEPHALIC ARTERIOVENOUS (AV) FISTULA CREATION;   Surgeon: Daria Pastures, MD;  Location: [facility] OR;  Service: Vascular;  Laterality: Left;   AV FISTULA PLACEMENT Left 01/26/2024   Procedure: LEFT ARTERIOVENOUS (AV) FISTULA CREATION;  Surgeon: Daria Pastures, MD;  Location: [facility] OR;  Service: Vascular;  Laterality: Left;   BIOPSY  09/02/2021   Procedure: BIOPSY;  Surgeon: Imogene Burn, MD;  Location: [facility] ENDOSCOPY;  Service: Gastroenterology;;   BIOPSY  02/18/2023   Procedure: BIOPSY;  Surgeon: Benancio Deeds, MD;  Location: [facility] ENDOSCOPY;  Service: Gastroenterology;;   COLONOSCOPY  05/11/2019   COLONOSCOPY  2021   3 polyps - tubular adenomas   ESOPHAGOGASTRODUODENOSCOPY (EGD) WITH PROPOFOL N/A 09/02/2021   Procedure: ESOPHAGOGASTRODUODENOSCOPY (EGD) WITH PROPOFOL;  Surgeon: Imogene Burn, MD;  Location: [facility] ENDOSCOPY;  Service: Gastroenterology;  Laterality: N/A;   ESOPHAGOGASTRODUODENOSCOPY (EGD) WITH PROPOFOL N/A 02/18/2023   Procedure: ESOPHAGOGASTRODUODENOSCOPY (EGD) WITH PROPOFOL;  Surgeon: Benancio Deeds, MD;  Location: [facility] ENDOSCOPY;  Service: Gastroenterology;  Laterality: N/A;   INTRAMEDULLARY (IM) NAIL INTERTROCHANTERIC Right 10/15/2023   Procedure: INTRAMEDULLARY (IM) NAIL INTERTROCHANTERIC;  Surgeon: Roby Lofts, MD;  Location: [facility] OR;  Service: Orthopedics;  Laterality: Right;   IR FLUORO GUIDE CV LINE RIGHT  10/15/2023   IR US GUIDE VASC ACCESS RIGHT  10/15/2023   POLYPECTOMY     WISDOM TOOTH EXTRACTION  Patient Active Problem List   Diagnosis Date Noted   Nausea and vomiting 01/26/2024   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/25/2024   Depression 01/24/2024   Hypertensive urgency 01/24/2024   Hypotension 12/01/2023   Altered mental status 11/30/2023   ESRD (end stage renal disease) on dialysis (HCC) 11/30/2023   Right knee pain 10/24/2023   Nonimmune to hepatitis B virus 10/22/2023   Closed fracture of right hip (HCC) 10/15/2023   T2DM (type 2 diabetes mellitus) (HCC) 10/14/2023   Urinary retention 10/14/2023   Closed  intertrochanteric fracture of right hip (HCC) 10/13/2023   Patellar tendinitis 10/12/2023   Wrist sprain, left, initial encounter 10/12/2023   Contracture, 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   Mucosal abnormality of esophagus 02/18/2023   Foot pain, left 02/17/2023   SOB (shortness of breath) 02/16/2023   History of esophagitis 02/16/2023   Tachycardia 02/15/2023   Allergic rhinitis 11/07/2022   Chronic kidney disease, stage 5 (HCC) 09/11/2022   Tobacco dependence 09/11/2022   GERD with esophagitis 09/02/2021   Acute kidney injury superimposed on CKD (HCC) 04/10/2021   Achilles tendon contracture, left 03/03/2018   Non-pressure chronic ulcer of other part of left foot limited to breakdown of skin (HCC) 02/02/2018   History of right below knee amputation (HCC) 12/22/2017   Acute posthemorrhagic anemia    Falls 11/24/2017   Severe hypertension 11/24/2017   Anemia 11/24/2017   Uncontrolled type 2 diabetes mellitus with hyperglycemia, with long-term current use of insulin (HCC) 10/11/2017   ARF (acute renal failure) (HCC) 10/11/2017    PCP: [name], MD  REFERRING PROVIDER: Nadara Mustard, MD  REFERRING DIAG: [phone] (ICD-10-CM) - Chronic pain of right knee  THERAPY DIAG:  Difficulty in walking, not elsewhere classified  Unsteadiness on feet  Repeated falls  Muscle weakness (generalized)  Chronic pain of right knee  Abnormality of gait and mobility  Rationale for Evaluation and Treatment: Rehabilitation  ONSET DATE: 09/2023   SUBJECTIVE:   SUBJECTIVE STATEMENT: He saw vascular doctor who recommended using ace wrap on left arm to help control swelling.  The knee is about same.    PERTINENT HISTORY: Type 2 diabetes, HLD, HTN, peripheral artery disease, Rt below knee amputation, left toe amputation, end stage renal disease, right hip fracture in 09/2023  PAIN:  Are you having pain? Yes: NPRS scale: today  9/10 and up to 10/10 this week Pain location: Super-medial  knee and above the knee cap Pain description: Sharp, worsens with increased weight-bearing Aggravating factors: Standing and walking, stand to sit transition, knee flexion and hip flexion Relieving factors: Getting off the knee  PRECAUTIONS:  NO BP LUE, fall risk  RED FLAGS: None   WEIGHT BEARING RESTRICTIONS:  [name] [name] notes increased WB increases symptoms  FALLS:  Has patient fallen in last 6 months?  Yes, cause of initial injury but no falls since that time.  LIVING ENVIRONMENT: Lives with: lives with their family Lives in: Mobile home Stairs:  Needs handrails Has following equipment at home: Single point cane, Environmental consultant - 4 wheeled, Wheelchair (manual), shower chair, and shower bars  OCCUPATION: Disability, drive, racing  PLOF:  Had 3 falls previous before the fall that resulted in a fracture   PATIENT GOALS: Be able to walk safely without the walker and with less knee pain  NEXT MD VISIT: 04/03/2024  OBJECTIVE:  Note: Objective measures were completed at Evaluation unless otherwise noted.  DIAGNOSTIC FINDINGS: 1. No meniscal or ligamentous injury of the right knee. 2. Mild osseous contusion of  the posteromedial tibial plateau. 3. Severe edema in infrapatellar Hoffa's fat likely reflecting a contusion secondary to direct trauma.  PATIENT SURVEYS:  Patient-Specific Activity Scoring Scheme  "0" represents "unable to perform." "10" represents "able to perform at prior level. 0 1 2 3 4 5 6 7 8 9  10 (Date and Score)   Activity 02/16/2024    1. Walking with cane in community  0    2. Walking in house without device  0    3. Picking up items from floor 0   4.    5.    Score 0    Total score = sum of the activity scores/number of activities Minimum detectable change (90%CI) for average score = 2 points Minimum detectable change (90%CI) for single activity score = 3 points    COGNITION: Overall cognitive status: Within functional limits for tasks  assessed     SENSATION: Diabetic neuropathy of the left foot and some tingling above the right knee  EDEMA:  Kasin notes swelling his left side but he gets off the right side before swelling is an issue  LOWER EXTREMITY ROM:  Active ROM Left/Right 02/04/2024   Hip flexion    Hip extension    Hip abduction    Hip adduction    Hip internal rotation    Hip external rotation    Knee flexion 140/118   Knee extension 0/0   Ankle dorsiflexion    Ankle plantarflexion    Ankle inversion    Ankle eversion     (Blank rows = not tested)  LOWER EXTREMITY STRENGTH:  Strength was assessed using circumferential thigh girth 15 cm proximal to the superior patellar pole Left/Right 02/04/2024   Hip flexion    Hip extension    Hip abduction    Hip adduction    Hip internal rotation    Hip external rotation    Knee flexion    Knee extension 48.5 cm/47.0 cm 10-15% strength deficit per centimeter of atrophy   Ankle dorsiflexion    Ankle plantarflexion    Ankle inversion    Ankle eversion     (Blank rows = not tested)  GAIT: Evaluation on 02/04/2024: Distance walked: 50 feet Assistive device utilized: Environmental consultant - 4 wheeled Level of assistance: Complete Independence Comments: Junior notes he has increased knee pain immediately upon standing.  He is very limited in his standing and walking endurance as a result.  Prosthetic Wear & Care: 02/16/2024:  He is wearing prosthesis from arising to bedtime.   No skin issues noted.                                                                                                                             TREATMENT DATE:  02/21/2024  Therapeutic Exercise: Nustep seat 15 level 6 with BLEs & BUEs for 8 min Seated LAQ BLEs 5 sec hold 10 reps 1 sets using strap assist for full range concentric & no strap  assist isometric/eccentric Seated hamstring curl RLE red Theraband with assist concentric for full range & controlled eccentric 10 reps 1 sets; LLE green  theraband 10 reps 1 sets.   Therapeutic Activities Standing with locked rollator walker support: hip flexion SLR RLE yellow & LLE red 10 reps ea: abduction red band around knees 10 reps ea LE.  Leg Press BLEs 50# 10 reps 2 sets Pt answered correctly have 2.1kg (wt of prosthesis) subtracted from weigh-in and weigh-out at dialysis.    TREATMENT DATE:  02/16/2024  Therapeutic Exercise: Nustep seat 15 level 5 with BLEs & BUEs for 8 min Seated LAQ alternating LEs 5 sec hold 10 reps 2 sets Seated hamstring curl RLE red Theraband with assist concentric for full range & controlled eccentric 10 reps 2 sets; LLE green theraband 10 reps 2 sets.  Standing BUE support on locked rollator walker: 10 reps ea alternating hip flexion SLR, abduction / stepping laterally and hip ext  Self-Care: BP 170/95 HR 84  Pt reported light headed.  He also reported that BP this morning at doctor was 110/70 something. He reports that he has been on dialysis since Oct. 2024.  He sleeps for 5-8 hours after dialysis.  He reports that he is not compliant with fluid restrictions.  PT verbally educated on "dry weight" ,  when he weighs into dialysis they use amount that he is over his dry weight to determine how much fluid needs to be pulled off.  They typically pull the fluid off in same amount of time so if more fluid, then they pull "harder"  Dialysis can lead to muscle weakness and fatigue issues.  When his fluid levels vary a lot this can make recovery from muscle issues harder and slower.  Pt verbalized understanding.     TREATMENT DATE:  02/04/2024  Supine quadriceps sets with 2 pillows under his knees 10 x 5 seconds Attempted seated straight leg raises, but Avigdor did not have enough quadriceps strength to perform this exercise correctly today  Functional Activities: Reviewed exam findings and day 1 home exercise program, discussed MRI results and that his current problem is likely related to atrophy given his decrease in  activity since the fall   PATIENT EDUCATION:  Education details: See above Person educated: Patient Education method: Explanation, Demonstration, Tactile cues, Verbal cues, and Handouts Education comprehension: verbalized understanding, returned demonstration, verbal cues required, tactile cues required, and needs further education  HOME EXERCISE PROGRAM: Access Code: [email or web address] URL: [email or web address] Date: 02/04/2024 Prepared by: Pauletta Browns  Exercises - Supine Quadricep Sets  - 5 x daily - 7 x weekly - 2 sets - 10 reps - 5 second hold  ASSESSMENT:  CLINICAL IMPRESSION: Patient appears to be slowly improving strength in RLE but pain in knee is high which limits his mobility.   He appears to understand PT instruction regarding dialysis and weakness / fatigue.    Evaluation on 02/04/2024:  Patient is a 53 y.o. male who was seen today for physical therapy evaluation and treatment for M25.561,G89.29 (ICD-10-CM) - Chronic pain of right knee.  Callin had a fall in November 2024 that resulted in a hip fracture.  His knee pain has been present since that time.  It is very likely that Finnis's right knee pain is related to the decrease in activity, strength and atrophy related to this fall and fracture.  Manual palpation of quadriceps sets shows significantly decreased right quadriceps activation as compared to the left and significant atrophy is noted of  the right thigh.  Cailan was started on an appropriate home exercise program to improve quadricep strength, knee pain and allow him to improve his standing and walking endurance.  Given the 53-month history of this problem, and because of his other medical issues including severe diabetes and a below-knee amputation, Wille Celeste may require a longer than normal course of rehabilitation.  OBJECTIVE IMPAIRMENTS: Abnormal gait, cardiopulmonary status limiting activity, decreased activity tolerance, decreased balance, decreased endurance,  decreased knowledge of condition, difficulty walking, decreased ROM, decreased strength, increased edema, impaired perceived functional ability, and pain.   ACTIVITY LIMITATIONS: carrying, bending, standing, squatting, stairs, transfers, and locomotion level  PARTICIPATION LIMITATIONS: community activity  PERSONAL FACTORS: Type 2 diabetes, HLD, HTN, peripheral artery disease, Rt below knee amputation, left toe amputation, end stage renal disease, right hip fracture in 09/2023 are also affecting patient's functional outcome.   REHAB POTENTIAL: Good  CLINICAL DECISION MAKING: Stable/uncomplicated  EVALUATION COMPLEXITY: Moderate   GOALS: Goals reviewed with patient? Yes  SHORT TERM GOALS: Target date: 03/03/2024 Kemauri will be independent with his day 1 home exercise program Baseline: Started 02/04/2024 Goal status: Ongoing  02/21/2024  2.  Improve right quadriceps strength as assessed by improved quadriceps activation as assessed manually by the physical therapist during a quadriceps set exercise Baseline: Poor activation at evaluation Goal status: Ongoing  02/21/2024   LONG TERM GOALS: Target date: 03/31/2024  Improve patient specific functional scale to at least 50% for walking in home & community and picking up items Baseline: 0% Goal status: Ongoing  02/21/2024  2.  Martyn will report right knee pain no greater than 3 out of 10 on the visual analog scale Baseline: As high as 10 out of 10 Goal status: Ongoing  02/21/2024  3.  Kyjuan will be able to complete a set of 10 consecutive seated straight leg raises, demonstrating improved quadriceps strength Baseline: Unable at evaluation Goal status: Ongoing  02/21/2024  4.  Bobbi will report improved standing and walking endurance as compared to evaluation Baseline: Significantly increased right knee pain immediately upon standing which severely limits standing and walking endurance Goal status: Ongoing  02/21/2024  5.  Daryus will be  independent with his long-term maintenance home exercise program Baseline: Started 02/04/2024 Goal status: Ongoing 02/21/2024  PLAN:  PT FREQUENCY: 1-2x/week  PT DURATION: 8 weeks  PLANNED INTERVENTIONS: 97110-Therapeutic exercises, 97530- Therapeutic activity, 97112- Neuromuscular re-education, 97535- Self Care, [id number]- Manual therapy, [phone]- Gait training, [phone]- Prosthetic training, [phone]- Vasopneumatic device, Patient/Family education, Balance training, Stair training, and Cryotherapy  PLAN FOR NEXT SESSION:  update HEP, continue therapeutic exercise and therapeutic activities.  Standing balance   Vladimir Faster, PT, DPT 02/21/2024, 10:53 AM

## 2024-02-22 DIAGNOSIS — N2581 Secondary hyperparathyroidism of renal origin: Secondary | ICD-10-CM | POA: Diagnosis not present

## 2024-02-22 DIAGNOSIS — E1122 Type 2 diabetes mellitus with diabetic chronic kidney disease: Secondary | ICD-10-CM | POA: Diagnosis not present

## 2024-02-22 DIAGNOSIS — D689 Coagulation defect, unspecified: Secondary | ICD-10-CM | POA: Diagnosis not present

## 2024-02-22 DIAGNOSIS — N186 End stage renal disease: Secondary | ICD-10-CM | POA: Diagnosis not present

## 2024-02-22 DIAGNOSIS — D631 Anemia in chronic kidney disease: Secondary | ICD-10-CM | POA: Diagnosis not present

## 2024-02-22 DIAGNOSIS — I132 Hypertensive heart and chronic kidney disease with heart failure and with stage 5 chronic kidney disease, or end stage renal disease: Secondary | ICD-10-CM | POA: Diagnosis not present

## 2024-02-22 DIAGNOSIS — Z992 Dependence on renal dialysis: Secondary | ICD-10-CM | POA: Diagnosis not present

## 2024-02-22 DIAGNOSIS — T8249XA Other complication of vascular dialysis catheter, initial encounter: Secondary | ICD-10-CM | POA: Diagnosis not present

## 2024-02-23 ENCOUNTER — Encounter: Payer: Self-pay | Admitting: Physical Therapy

## 2024-02-23 ENCOUNTER — Ambulatory Visit (INDEPENDENT_AMBULATORY_CARE_PROVIDER_SITE_OTHER): Admitting: Physical Therapy

## 2024-02-23 DIAGNOSIS — M6281 Muscle weakness (generalized): Secondary | ICD-10-CM | POA: Diagnosis not present

## 2024-02-23 DIAGNOSIS — R296 Repeated falls: Secondary | ICD-10-CM

## 2024-02-23 DIAGNOSIS — G8929 Other chronic pain: Secondary | ICD-10-CM

## 2024-02-23 DIAGNOSIS — M25561 Pain in right knee: Secondary | ICD-10-CM | POA: Diagnosis not present

## 2024-02-23 DIAGNOSIS — R2681 Unsteadiness on feet: Secondary | ICD-10-CM

## 2024-02-23 DIAGNOSIS — R262 Difficulty in walking, not elsewhere classified: Secondary | ICD-10-CM | POA: Diagnosis not present

## 2024-02-23 NOTE — Therapy (Signed)
 OUTPATIENT PHYSICAL THERAPY LOWER EXTREMITY TREATMENT   Patient Name: [name] [name] MRN: [id number] DOB:1971-07-17, 53 y.o., male Today's Date: 02/23/2024  END OF SESSION:  PT End of Session - 02/23/24 1100     Visit Number 4    Number of Visits 16    Date for PT Re-Evaluation 03/31/24    Authorization Type [employer] dual complete no authorization needed  20% coinsurance    Progress Note Due on Visit 10    PT Start Time 1100    PT Stop Time 1144    PT Time Calculation (min) 44 min    Activity Tolerance Patient tolerated treatment well;No increased pain;Patient limited by pain    Behavior During Therapy [facility] for tasks assessed/performed               Past Medical History:  Diagnosis Date   Abdominal pain 11/30/2023   Abdominal tenderness 10/14/2023   Allergy    seasonal and environmental   Anemia    Chest pain 12/01/2023   Chronic constipation 05/13/2020   Depression    Diabetes mellitus without complication (HCC) 2019   Dyspnea    hx - SOB with exertion   ESRD on hemodialysis (HCC)    Tues, Thurs, Sat   GERD (gastroesophageal reflux disease)    Hyperlipidemia    Hypertension    Necrotizing fasciitis (HCC) 10/13/2017   Neuromuscular disorder (HCC)    neuropathy   PAD (peripheral artery disease) (HCC)    Secondary hyperparathyroidism (HCC)    SVT (supraventricular tachycardia) (HCC)    hx   Wound dehiscence 11/24/2017   Past Surgical History:  Procedure Laterality Date   AMPUTATION Right 10/13/2017   Procedure: RIGHT BELOW KNEE AMPUTATION;  Surgeon: Nadara Mustard, MD;  Location: [facility] OR;  Service: Orthopedics;  Laterality: Right;   AMPUTATION Right 11/24/2017   Procedure: AMPUTATION BELOW KNEE REVISION;  Surgeon: Nadara Mustard, MD;  Location: [facility] OR;  Service: Orthopedics;  Laterality: Right;   AMPUTATION TOE Left    AV FISTULA PLACEMENT Left 10/19/2023   Procedure: LEFT BRACHIOCEPHALIC ARTERIOVENOUS (AV) FISTULA CREATION;  Surgeon: Daria Pastures, MD;   Location: [facility] OR;  Service: Vascular;  Laterality: Left;   AV FISTULA PLACEMENT Left 01/26/2024   Procedure: LEFT ARTERIOVENOUS (AV) FISTULA CREATION;  Surgeon: Daria Pastures, MD;  Location: [facility] OR;  Service: Vascular;  Laterality: Left;   BIOPSY  09/02/2021   Procedure: BIOPSY;  Surgeon: Imogene Burn, MD;  Location: [facility] ENDOSCOPY;  Service: Gastroenterology;;   BIOPSY  02/18/2023   Procedure: BIOPSY;  Surgeon: Benancio Deeds, MD;  Location: [facility] ENDOSCOPY;  Service: Gastroenterology;;   COLONOSCOPY  05/11/2019   COLONOSCOPY  2021   3 polyps - tubular adenomas   ESOPHAGOGASTRODUODENOSCOPY (EGD) WITH PROPOFOL N/A 09/02/2021   Procedure: ESOPHAGOGASTRODUODENOSCOPY (EGD) WITH PROPOFOL;  Surgeon: Imogene Burn, MD;  Location: [facility] ENDOSCOPY;  Service: Gastroenterology;  Laterality: N/A;   ESOPHAGOGASTRODUODENOSCOPY (EGD) WITH PROPOFOL N/A 02/18/2023   Procedure: ESOPHAGOGASTRODUODENOSCOPY (EGD) WITH PROPOFOL;  Surgeon: Benancio Deeds, MD;  Location: [facility] ENDOSCOPY;  Service: Gastroenterology;  Laterality: N/A;   INTRAMEDULLARY (IM) NAIL INTERTROCHANTERIC Right 10/15/2023   Procedure: INTRAMEDULLARY (IM) NAIL INTERTROCHANTERIC;  Surgeon: Roby Lofts, MD;  Location: [facility] OR;  Service: Orthopedics;  Laterality: Right;   IR FLUORO GUIDE CV LINE RIGHT  10/15/2023   IR US GUIDE VASC ACCESS RIGHT  10/15/2023   POLYPECTOMY     WISDOM TOOTH EXTRACTION     Patient Active Problem List  Diagnosis Date Noted   Nausea and vomiting 01/26/2024   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/25/2024   Depression 01/24/2024   Hypertensive urgency 01/24/2024   Hypotension 12/01/2023   Altered mental status 11/30/2023   ESRD (end stage renal disease) on dialysis (HCC) 11/30/2023   Right knee pain 10/24/2023   Nonimmune to hepatitis B virus 10/22/2023   Closed fracture of right hip (HCC) 10/15/2023   T2DM (type 2 diabetes mellitus) (HCC) 10/14/2023   Urinary retention 10/14/2023   Closed intertrochanteric fracture of  right hip (HCC) 10/13/2023   Patellar tendinitis 10/12/2023   Wrist sprain, left, initial encounter 10/12/2023   Contracture, 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   Mucosal abnormality of esophagus 02/18/2023   Foot pain, left 02/17/2023   SOB (shortness of breath) 02/16/2023   History of esophagitis 02/16/2023   Tachycardia 02/15/2023   Allergic rhinitis 11/07/2022   Chronic kidney disease, stage 5 (HCC) 09/11/2022   Tobacco dependence 09/11/2022   GERD with esophagitis 09/02/2021   Acute kidney injury superimposed on CKD (HCC) 04/10/2021   Achilles tendon contracture, left 03/03/2018   Non-pressure chronic ulcer of other part of left foot limited to breakdown of skin (HCC) 02/02/2018   History of right below knee amputation (HCC) 12/22/2017   Acute posthemorrhagic anemia    Falls 11/24/2017   Severe hypertension 11/24/2017   Anemia 11/24/2017   Uncontrolled type 2 diabetes mellitus with hyperglycemia, with long-term current use of insulin (HCC) 10/11/2017   ARF (acute renal failure) (HCC) 10/11/2017    PCP: [name], MD  REFERRING PROVIDER: Nadara Mustard, MD  REFERRING DIAG: [phone] (ICD-10-CM) - Chronic pain of right knee  THERAPY DIAG:  Difficulty in walking, not elsewhere classified  Unsteadiness on feet  Repeated falls  Chronic pain of right knee  Muscle weakness (generalized)  Rationale for Evaluation and Treatment: Rehabilitation  ONSET DATE: 09/2023   SUBJECTIVE:   SUBJECTIVE STATEMENT: No changes since last PT.     PERTINENT HISTORY: Type 2 diabetes, HLD, HTN, peripheral artery disease, Rt below knee amputation, left toe amputation, end stage renal disease, right hip fracture in 09/2023  PAIN:  Are you having pain? Yes: NPRS scale: today 9/10 and up to 9/10 this week, no lower Pain location: Super-medial knee and above the knee cap Pain description: Sharp, worsens with increased weight-bearing Aggravating factors: Standing and walking, stand to sit  transition, knee flexion and hip flexion Relieving factors: Getting off the knee  PRECAUTIONS:  NO BP LUE, fall risk  RED FLAGS: None   WEIGHT BEARING RESTRICTIONS:  Diandre notes increased WB increases symptoms  FALLS:  Has patient fallen in last 6 months?  Yes, cause of initial injury but no falls since that time.  LIVING ENVIRONMENT: Lives with: lives with their family Lives in: Mobile home Stairs:  Needs handrails Has following equipment at home: Single point cane, Environmental consultant - 4 wheeled, Wheelchair (manual), shower chair, and shower bars  OCCUPATION: Disability, drive, racing  PLOF:  Had 3 falls previous before the fall that resulted in a fracture   PATIENT GOALS: Be able to walk safely without the walker and with less knee pain  NEXT MD VISIT: 04/03/2024  OBJECTIVE:  Note: Objective measures were completed at Evaluation unless otherwise noted.  DIAGNOSTIC FINDINGS: 1. No meniscal or ligamentous injury of the right knee. 2. Mild osseous contusion of the posteromedial tibial plateau. 3. Severe edema in infrapatellar Hoffa's fat likely reflecting a contusion secondary to direct trauma.  PATIENT SURVEYS:  Patient-Specific Activity Scoring Scheme  "  0" represents "unable to perform." "10" represents "able to perform at prior level. 0 1 2 3 4 5 6 7 8 9  10 (Date and Score)   Activity 02/16/2024    1. Walking with cane in community  0    2. Walking in house without device  0    3. Picking up items from floor 0   4.    5.    Score 0    Total score = sum of the activity scores/number of activities Minimum detectable change (90%CI) for average score = 2 points Minimum detectable change (90%CI) for single activity score = 3 points    COGNITION: Overall cognitive status: Within functional limits for tasks assessed     SENSATION: Diabetic neuropathy of the left foot and some tingling above the right knee  EDEMA:  Koran notes swelling his left side but he gets off the  right side before swelling is an issue  LOWER EXTREMITY ROM:  Active ROM Left/Right 02/04/2024   Hip flexion    Hip extension    Hip abduction    Hip adduction    Hip internal rotation    Hip external rotation    Knee flexion 140/118   Knee extension 0/0   Ankle dorsiflexion    Ankle plantarflexion    Ankle inversion    Ankle eversion     (Blank rows = not tested)  LOWER EXTREMITY STRENGTH:  Strength was assessed using circumferential thigh girth 15 cm proximal to the superior patellar pole Left/Right 02/04/2024   Hip flexion    Hip extension    Hip abduction    Hip adduction    Hip internal rotation    Hip external rotation    Knee flexion    Knee extension 48.5 cm/47.0 cm 10-15% strength deficit per centimeter of atrophy   Ankle dorsiflexion    Ankle plantarflexion    Ankle inversion    Ankle eversion     (Blank rows = not tested)  GAIT: Evaluation on 02/04/2024: Distance walked: 50 feet Assistive device utilized: Environmental consultant - 4 wheeled Level of assistance: Complete Independence Comments: Ganon notes he has increased knee pain immediately upon standing.  He is very limited in his standing and walking endurance as a result.  Prosthetic Wear & Care: 02/16/2024:  He is wearing prosthesis from arising to bedtime.   No skin issues noted.                                                                                                                             TREATMENT DATE:  02/23/2024  Therapeutic Exercise: Nustep seat 15 Level 7 with BLEs & BUEs for 4  min, then level 4 BLEs only 4 min Seated LAQ 10 reps 2 sets ea LE.  RLE full range without assist today.  Seated hamstring curl RLE red Theraband with assist (only last 25% today, was last 50%) concentric for full range & controlled eccentric 10 reps  1 sets; LLE green theraband 10 reps 1 sets.  PT updated HEP (see below) with HO, demo & verbal cues.  Pt verbalized understanding.   Therapeutic Activities Standing with  locked rollator walker support: hip flexion SLR & hip ext with RLE yellow & LLE red 10 reps ea for 2 sets Standing with locked rollator walker support:  abduction red band around knees 10 reps ea LE. Alternating LEs. Leg Press BLEs 56# 10 reps 2 sets;  single leg 25# LLE & 12# RLE (unable to move with 25# or 18#) 10 reps ea LE    TREATMENT DATE:  02/21/2024  Therapeutic Exercise: Nustep seat 15 level 6 with BLEs & BUEs for 8 min Seated LAQ BLEs 5 sec hold 10 reps 1 sets using strap assist for full range concentric & no strap assist isometric/eccentric Seated hamstring curl RLE red Theraband with assist concentric for full range & controlled eccentric 10 reps 1 sets; LLE green theraband 10 reps 1 sets.   Therapeutic Activities Standing with locked rollator walker support: hip flexion SLR RLE yellow & LLE red 10 reps ea for 2 sets Standing with locked rollator walker support:  abduction red band around knees 10 reps ea LE. Alternating LEs. Leg Press BLEs 50# 10 reps 2 sets Pt answered correctly have 2.1kg (wt of prosthesis) subtracted from weigh-in and weigh-out at dialysis.    TREATMENT DATE:  02/16/2024  Therapeutic Exercise: Nustep seat 15 level 5 with BLEs & BUEs for 8 min Seated LAQ alternating LEs 5 sec hold 10 reps 2 sets Seated hamstring curl RLE red Theraband with assist concentric for full range & controlled eccentric 10 reps 2 sets; LLE green theraband 10 reps 2 sets.  Standing BUE support on locked rollator walker: 10 reps ea alternating hip flexion SLR, abduction / stepping laterally and hip ext  Self-Care: BP 170/95 HR 84  Pt reported light headed.  He also reported that BP this morning at doctor was 110/70 something. He reports that he has been on dialysis since Oct. 2024.  He sleeps for 5-8 hours after dialysis.  He reports that he is not compliant with fluid restrictions.  PT verbally educated on "dry weight" ,  when he weighs into dialysis they use amount that he is over his  dry weight to determine how much fluid needs to be pulled off.  They typically pull the fluid off in same amount of time so if more fluid, then they pull "harder"  Dialysis can lead to muscle weakness and fatigue issues.  When his fluid levels vary a lot this can make recovery from muscle issues harder and slower.  Pt verbalized understanding.    PATIENT EDUCATION:  Education details: See above Person educated: Patient Education method: Explanation, Demonstration, Tactile cues, Verbal cues, and Handouts Education comprehension: verbalized understanding, returned demonstration, verbal cues required, tactile cues required, and needs further education  HOME EXERCISE PROGRAM: Access Code: [email or web address] URL: [email or web address] Date: 02/23/2024 Prepared by: Vladimir Faster  Exercises - Supine Quadricep Sets  - 5 x daily - 7 x weekly - 2 sets - 10 reps - 5 second hold - Seated Long Arc Quad  - 1 x daily - 4 x weekly - 2 sets - 10 reps - 5 seconds hold - Standing Hip Abduction Alternating legs  - 1 x daily - 4 x weekly - 2 sets - 10 reps - 5 seconds hold - Alternating Hip Flexion Forward Kicks  - 1 x daily - 4 x weekly -  2 sets - 10 reps - 5 seconds hold - Standing Hip Extension with Counter Support  - 1 x daily - 4 x weekly - 2 sets - 10 reps - 5 seconds hold  ASSESSMENT:  CLINICAL IMPRESSION: Patient appears to be slowly improving strength. Dialysis and overall health will probably make progress take longer time.  He appears to understand updated HEP.  He continues to benefit from skilled PT.    Evaluation on 02/04/2024:  Patient is a 53 y.o. male who was seen today for physical therapy evaluation and treatment for M25.561,G89.29 (ICD-10-CM) - Chronic pain of right knee.  Johnte had a fall in November 2024 that resulted in a hip fracture.  His knee pain has been present since that time.  It is very likely that Laden's right knee pain is related to the decrease in activity, strength and  atrophy related to this fall and fracture.  Manual palpation of quadriceps sets shows significantly decreased right quadriceps activation as compared to the left and significant atrophy is noted of the right thigh.  Adolphus was started on an appropriate home exercise program to improve quadricep strength, knee pain and allow him to improve his standing and walking endurance.  Given the 36-month history of this problem, and because of his other medical issues including severe diabetes and a below-knee amputation, Wille Celeste may require a longer than normal course of rehabilitation.  OBJECTIVE IMPAIRMENTS: Abnormal gait, cardiopulmonary status limiting activity, decreased activity tolerance, decreased balance, decreased endurance, decreased knowledge of condition, difficulty walking, decreased ROM, decreased strength, increased edema, impaired perceived functional ability, and pain.   ACTIVITY LIMITATIONS: carrying, bending, standing, squatting, stairs, transfers, and locomotion level  PARTICIPATION LIMITATIONS: community activity  PERSONAL FACTORS: Type 2 diabetes, HLD, HTN, peripheral artery disease, Rt below knee amputation, left toe amputation, end stage renal disease, right hip fracture in 09/2023 are also affecting patient's functional outcome.   REHAB POTENTIAL: Good  CLINICAL DECISION MAKING: Stable/uncomplicated  EVALUATION COMPLEXITY: Moderate   GOALS: Goals reviewed with patient? Yes  SHORT TERM GOALS: Target date: 03/03/2024 Mayan will be independent with his day 1 home exercise program Baseline: Started 02/04/2024 Goal status: Ongoing  02/23/2024  2.  Improve right quadriceps strength as assessed by improved quadriceps activation as assessed manually by the physical therapist during a quadriceps set exercise Baseline: Poor activation at evaluation Goal status: Ongoing  4/2/20255   LONG TERM GOALS: Target date: 03/31/2024  Improve patient specific functional scale to at least 50% for  walking in home & community and picking up items Baseline: 0% Goal status: Ongoing  02/23/2024  2.  Hilton will report right knee pain no greater than 3 out of 10 on the visual analog scale Baseline: As high as 10 out of 10 Goal status: Ongoing  02/23/2024  3.  Divonte will be able to complete a set of 10 consecutive seated straight leg raises, demonstrating improved quadriceps strength Baseline: Unable at evaluation Goal status: Ongoing  02/23/2024  4.  Markez will report improved standing and walking endurance as compared to evaluation Baseline: Significantly increased right knee pain immediately upon standing which severely limits standing and walking endurance Goal status: Ongoing  02/23/2024  5.  Liandro will be independent with his long-term maintenance home exercise program Baseline: Started 02/04/2024 Goal status: Ongoing 02/23/2024  PLAN:  PT FREQUENCY: 1-2x/week  PT DURATION: 8 weeks  PLANNED INTERVENTIONS: 97110-Therapeutic exercises, 97530- Therapeutic activity, 97112- Neuromuscular re-education, 97535- Self Care, [id number]- Manual therapy, [id number]- Gait training, [phone]- Prosthetic training,  [id number]- Vasopneumatic device, Patient/Family education, Balance training, Stair training, and Cryotherapy  PLAN FOR NEXT SESSION:  check STGs,  continue therapeutic exercise and therapeutic activities.  Standing balance   Vladimir Faster, PT, DPT 02/23/2024, 3:14 PM

## 2024-02-24 DIAGNOSIS — N186 End stage renal disease: Secondary | ICD-10-CM | POA: Diagnosis not present

## 2024-02-24 DIAGNOSIS — I132 Hypertensive heart and chronic kidney disease with heart failure and with stage 5 chronic kidney disease, or end stage renal disease: Secondary | ICD-10-CM | POA: Diagnosis not present

## 2024-02-24 DIAGNOSIS — N2581 Secondary hyperparathyroidism of renal origin: Secondary | ICD-10-CM | POA: Diagnosis not present

## 2024-02-24 DIAGNOSIS — D689 Coagulation defect, unspecified: Secondary | ICD-10-CM | POA: Diagnosis not present

## 2024-02-24 DIAGNOSIS — Z992 Dependence on renal dialysis: Secondary | ICD-10-CM | POA: Diagnosis not present

## 2024-02-24 DIAGNOSIS — E1122 Type 2 diabetes mellitus with diabetic chronic kidney disease: Secondary | ICD-10-CM | POA: Diagnosis not present

## 2024-02-24 DIAGNOSIS — T8249XA Other complication of vascular dialysis catheter, initial encounter: Secondary | ICD-10-CM | POA: Diagnosis not present

## 2024-02-24 DIAGNOSIS — D631 Anemia in chronic kidney disease: Secondary | ICD-10-CM | POA: Diagnosis not present

## 2024-02-26 DIAGNOSIS — I132 Hypertensive heart and chronic kidney disease with heart failure and with stage 5 chronic kidney disease, or end stage renal disease: Secondary | ICD-10-CM | POA: Diagnosis not present

## 2024-02-26 DIAGNOSIS — T8249XA Other complication of vascular dialysis catheter, initial encounter: Secondary | ICD-10-CM | POA: Diagnosis not present

## 2024-02-26 DIAGNOSIS — E1122 Type 2 diabetes mellitus with diabetic chronic kidney disease: Secondary | ICD-10-CM | POA: Diagnosis not present

## 2024-02-26 DIAGNOSIS — D689 Coagulation defect, unspecified: Secondary | ICD-10-CM | POA: Diagnosis not present

## 2024-02-26 DIAGNOSIS — D631 Anemia in chronic kidney disease: Secondary | ICD-10-CM | POA: Diagnosis not present

## 2024-02-26 DIAGNOSIS — Z992 Dependence on renal dialysis: Secondary | ICD-10-CM | POA: Diagnosis not present

## 2024-02-26 DIAGNOSIS — N2581 Secondary hyperparathyroidism of renal origin: Secondary | ICD-10-CM | POA: Diagnosis not present

## 2024-02-26 DIAGNOSIS — N186 End stage renal disease: Secondary | ICD-10-CM | POA: Diagnosis not present

## 2024-02-28 ENCOUNTER — Ambulatory Visit: Admitting: Physical Therapy

## 2024-02-28 ENCOUNTER — Encounter: Payer: Self-pay | Admitting: Physical Therapy

## 2024-02-28 DIAGNOSIS — M25561 Pain in right knee: Secondary | ICD-10-CM

## 2024-02-28 DIAGNOSIS — G8929 Other chronic pain: Secondary | ICD-10-CM | POA: Diagnosis not present

## 2024-02-28 DIAGNOSIS — R262 Difficulty in walking, not elsewhere classified: Secondary | ICD-10-CM

## 2024-02-28 DIAGNOSIS — M6281 Muscle weakness (generalized): Secondary | ICD-10-CM

## 2024-02-28 DIAGNOSIS — R2681 Unsteadiness on feet: Secondary | ICD-10-CM

## 2024-02-28 DIAGNOSIS — R269 Unspecified abnormalities of gait and mobility: Secondary | ICD-10-CM | POA: Diagnosis not present

## 2024-02-28 DIAGNOSIS — R296 Repeated falls: Secondary | ICD-10-CM

## 2024-02-28 NOTE — Therapy (Signed)
 OUTPATIENT PHYSICAL THERAPY LOWER EXTREMITY TREATMENT   [name] [name] Name: [name] [name] MRN: [id number] DOB:07/15/1971, 53 y.o., male Today's Date: 02/28/2024  END OF SESSION:  [name] End of Session - 02/28/24 1256     Visit Number 5    Number of Visits 16    Date for [name] Re-Evaluation 03/31/24    Authorization Type [employer] dual complete no authorization needed  20% coinsurance    Progress Note Due on Visit 10    [name] Start Time 1258    [name] Stop Time 1341    [name] Time Calculation (min) 43 min    Activity Tolerance [name] [name] tolerated treatment well;No increased pain;[name] [name] limited by pain    Behavior During Therapy [facility] for tasks assessed/performed                Past Medical History:  Diagnosis Date   Abdominal pain 11/30/2023   Abdominal tenderness 10/14/2023   Allergy    seasonal and environmental   Anemia    Chest pain 12/01/2023   Chronic constipation 05/13/2020   Depression    Diabetes mellitus without complication (HCC) 2019   Dyspnea    hx - SOB with exertion   ESRD on hemodialysis (HCC)    Tues, Thurs, Sat   GERD (gastroesophageal reflux disease)    Hyperlipidemia    Hypertension    Necrotizing fasciitis (HCC) 10/13/2017   Neuromuscular disorder (HCC)    neuropathy   PAD (peripheral artery disease) (HCC)    Secondary hyperparathyroidism (HCC)    SVT (supraventricular tachycardia) (HCC)    hx   Wound dehiscence 11/24/2017   Past Surgical History:  Procedure Laterality Date   AMPUTATION Right 10/13/2017   Procedure: RIGHT BELOW KNEE AMPUTATION;  Surgeon: Nadara Mustard, MD;  Location: [facility] OR;  Service: Orthopedics;  Laterality: Right;   AMPUTATION Right 11/24/2017   Procedure: AMPUTATION BELOW KNEE REVISION;  Surgeon: Nadara Mustard, MD;  Location: [facility] OR;  Service: Orthopedics;  Laterality: Right;   AMPUTATION TOE Left    AV FISTULA PLACEMENT Left 10/19/2023   Procedure: LEFT BRACHIOCEPHALIC ARTERIOVENOUS (AV) FISTULA CREATION;  Surgeon: Daria Pastures, MD;   Location: [facility] OR;  Service: Vascular;  Laterality: Left;   AV FISTULA PLACEMENT Left 01/26/2024   Procedure: LEFT ARTERIOVENOUS (AV) FISTULA CREATION;  Surgeon: Daria Pastures, MD;  Location: [facility] OR;  Service: Vascular;  Laterality: Left;   BIOPSY  09/02/2021   Procedure: BIOPSY;  Surgeon: Imogene Burn, MD;  Location: [facility] ENDOSCOPY;  Service: Gastroenterology;;   BIOPSY  02/18/2023   Procedure: BIOPSY;  Surgeon: Benancio Deeds, MD;  Location: [facility] ENDOSCOPY;  Service: Gastroenterology;;   COLONOSCOPY  05/11/2019   COLONOSCOPY  2021   3 polyps - tubular adenomas   ESOPHAGOGASTRODUODENOSCOPY (EGD) WITH PROPOFOL N/A 09/02/2021   Procedure: ESOPHAGOGASTRODUODENOSCOPY (EGD) WITH PROPOFOL;  Surgeon: Imogene Burn, MD;  Location: [facility] ENDOSCOPY;  Service: Gastroenterology;  Laterality: N/A;   ESOPHAGOGASTRODUODENOSCOPY (EGD) WITH PROPOFOL N/A 02/18/2023   Procedure: ESOPHAGOGASTRODUODENOSCOPY (EGD) WITH PROPOFOL;  Surgeon: Benancio Deeds, MD;  Location: [facility] ENDOSCOPY;  Service: Gastroenterology;  Laterality: N/A;   INTRAMEDULLARY (IM) NAIL INTERTROCHANTERIC Right 10/15/2023   Procedure: INTRAMEDULLARY (IM) NAIL INTERTROCHANTERIC;  Surgeon: Roby Lofts, MD;  Location: [facility] OR;  Service: Orthopedics;  Laterality: Right;   IR FLUORO GUIDE CV LINE RIGHT  10/15/2023   IR US GUIDE VASC ACCESS RIGHT  10/15/2023   POLYPECTOMY     WISDOM TOOTH EXTRACTION     [name] [name] Active Problem  List   Diagnosis Date Noted   Nausea and vomiting 01/26/2024   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/25/2024   Depression 01/24/2024   Hypertensive urgency 01/24/2024   Hypotension 12/01/2023   Altered mental status 11/30/2023   ESRD (end stage renal disease) on dialysis (HCC) 11/30/2023   Right knee pain 10/24/2023   Nonimmune to hepatitis B virus 10/22/2023   Closed fracture of right hip (HCC) 10/15/2023   T2DM (type 2 diabetes mellitus) (HCC) 10/14/2023   Urinary retention 10/14/2023   Closed intertrochanteric fracture of  right hip (HCC) 10/13/2023   Patellar tendinitis 10/12/2023   Wrist sprain, left, initial encounter 10/12/2023   Contracture, 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   Mucosal abnormality of esophagus 02/18/2023   Foot pain, left 02/17/2023   SOB (shortness of breath) 02/16/2023   History of esophagitis 02/16/2023   Tachycardia 02/15/2023   Allergic rhinitis 11/07/2022   Chronic kidney disease, stage 5 (HCC) 09/11/2022   Tobacco dependence 09/11/2022   GERD with esophagitis 09/02/2021   Acute kidney injury superimposed on CKD (HCC) 04/10/2021   Achilles tendon contracture, left 03/03/2018   Non-pressure chronic ulcer of other part of left foot limited to breakdown of skin (HCC) 02/02/2018   History of right below knee amputation (HCC) 12/22/2017   Acute posthemorrhagic anemia    Falls 11/24/2017   Severe hypertension 11/24/2017   Anemia 11/24/2017   Uncontrolled type 2 diabetes mellitus with hyperglycemia, with long-term current use of insulin (HCC) 10/11/2017   ARF (acute renal failure) (HCC) 10/11/2017    PCP: [name], MD  REFERRING PROVIDER: Nadara Mustard, MD  REFERRING DIAG: [phone] (ICD-10-CM) - Chronic pain of right knee  THERAPY DIAG:  Difficulty in walking, not elsewhere classified  Unsteadiness on feet  Repeated falls  Chronic pain of right knee  Abnormality of gait and mobility  Muscle weakness (generalized)  Rationale for Evaluation and Treatment: Rehabilitation  ONSET DATE: 09/2023   SUBJECTIVE:   SUBJECTIVE STATEMENT: [name] [name] did exercises over the weekend "a little bit."   PERTINENT HISTORY: Type 2 diabetes, HLD, HTN, peripheral artery disease, Rt below knee amputation, left toe amputation, end stage renal disease, right hip fracture in 09/2023  PAIN:  Are you having pain? Yes: NPRS scale: today   9/10 and up to 9/10 this week, no lower Pain location: Super-medial knee and above the knee cap Pain description: Sharp, worsens with increased  weight-bearing Aggravating factors: Standing and walking, stand to sit transition, knee flexion and hip flexion Relieving factors: Getting off the knee  PRECAUTIONS:  NO BP LUE, fall risk  RED FLAGS: None   WEIGHT BEARING RESTRICTIONS:  Alper notes increased WB increases symptoms  FALLS:  Has [name] [name] fallen in last 6 months?  Yes, cause of initial injury but no falls since that time.  LIVING ENVIRONMENT: Lives with: lives with their family Lives in: Mobile home Stairs:  Needs handrails Has following equipment at home: Single point cane, Environmental consultant - 4 wheeled, Wheelchair (manual), shower chair, and shower bars  OCCUPATION: Disability, drive, racing  PLOF:  Had 3 falls previous before the fall that resulted in a fracture   [name] [name] GOALS: Be able to walk safely without the walker and with less knee pain  NEXT MD VISIT: 04/03/2024  OBJECTIVE:  Note: Objective measures were completed at Evaluation unless otherwise noted.  DIAGNOSTIC FINDINGS: 1. No meniscal or ligamentous injury of the right knee. 2. Mild osseous contusion of the posteromedial tibial plateau. 3. Severe edema in infrapatellar Hoffa's fat likely reflecting a  contusion secondary to direct trauma.  [name] [name] SURVEYS:  [name] [name]-Specific Activity Scoring Scheme  "0" represents "unable to perform." "10" represents "able to perform at prior level. 0 1 2 3 4 5 6 7 8 9  10 (Date and Score)   Activity 02/16/2024    1. Walking with cane in community  0    2. Walking in house without device  0    3. Picking up items from floor 0   4.    5.    Score 0    Total score = sum of the activity scores/number of activities Minimum detectable change (90%CI) for average score = 2 points Minimum detectable change (90%CI) for single activity score = 3 points    COGNITION: Overall cognitive status: Within functional limits for tasks assessed     SENSATION: Diabetic neuropathy of the left foot and some tingling above the right  knee  EDEMA:  Reinhart notes swelling his left side but [name] [name] gets off the right side before swelling is an issue  LOWER EXTREMITY ROM:  Active ROM Left/Right 02/04/2024   Hip flexion    Hip extension    Hip abduction    Hip adduction    Hip internal rotation    Hip external rotation    Knee flexion 140/118   Knee extension 0/0   Ankle dorsiflexion    Ankle plantarflexion    Ankle inversion    Ankle eversion     (Blank rows = not tested)  LOWER EXTREMITY STRENGTH:  Strength was assessed using circumferential thigh girth 15 cm proximal to the superior patellar pole Left/Right 02/04/2024 02/28/2024 Left/Right  Hip flexion    Hip extension    Hip abduction    Hip adduction    Hip internal rotation    Hip external rotation    Knee flexion    Knee extension 48.5 cm/47.0 cm 10-15% strength deficit per centimeter of atrophy 48.5 cm/47.0 cm 10-15% strength deficit per centimeter of atrophy  Ankle dorsiflexion    Ankle plantarflexion    Ankle inversion    Ankle eversion     (Blank rows = not tested)  GAIT: Evaluation on 02/04/2024: Distance walked: 50 feet Assistive device utilized: Environmental consultant - 4 wheeled Level of assistance: Complete Independence Comments: Amelio notes [name] [name] has increased knee pain immediately upon standing.  [name] [name] is very limited in his standing and walking endurance as a result.  Prosthetic Wear & Care: 02/16/2024:  [name] [name] is wearing prosthesis from arising to bedtime.   No skin issues noted.                                                                                                                             TREATMENT DATE:  02/28/2024  Therapeutic Exercise: Nustep seat 15 Level 7 with BLEs & BUEs for 4  min, then level 6 BLEs only 4 min Seated LAQ 10 reps 2 sets ea LE.  RLE full range without assist no weight  and LLE 2#. Seated hamstring curl RLE red Theraband with assist (only last 25% today, was last 50%) concentric for full range & controlled eccentric 10 reps 1  sets; LLE green theraband 10 reps 1 sets.   Therapeutic Activities Standing with locked rollator walker support: hip flexion SLR & hip ext with RLE red Tband & LLE green Tband 15 reps ea alternating LEs. Standing with locked rollator walker support:  abduction red band around knees 10 reps ea LE. Alternating LEs. Leg Press BLEs 56# 10 reps 2 sets;  single leg 25# LLE & 12# RLE (unable to move with 25# or 18#) 10 reps ea LE  TREATMENT DATE:  02/23/2024  Therapeutic Exercise: Nustep seat 15 Level 7 with BLEs & BUEs for 4  min, then level 4 BLEs only 4 min Seated LAQ 10 reps 2 sets ea LE.  RLE full range without assist today.  Seated hamstring curl RLE red Theraband with assist (only last 25% today, was last 50%) concentric for full range & controlled eccentric 10 reps 1 sets; LLE green theraband 10 reps 1 sets.  [name] updated HEP (see below) with HO, demo & verbal cues.  [name] [name].   Therapeutic Activities Standing with locked rollator walker support: hip flexion SLR & hip ext with RLE yellow & LLE red 10 reps ea for 2 sets Standing with locked rollator walker support:  abduction red band around knees 10 reps ea LE. Alternating LEs. Leg Press BLEs 56# 10 reps 2 sets;  single leg 25# LLE & 12# RLE (unable to move with 25# or 18#) 10 reps ea LE    TREATMENT DATE:  02/21/2024  Therapeutic Exercise: Nustep seat 15 level 6 with BLEs & BUEs for 8 min Seated LAQ BLEs 5 sec hold 10 reps 1 sets using strap assist for full range concentric & no strap assist isometric/eccentric Seated hamstring curl RLE red Theraband with assist concentric for full range & controlled eccentric 10 reps 1 sets; LLE green theraband 10 reps 1 sets.   Therapeutic Activities Standing with locked rollator walker support: hip flexion SLR RLE yellow & LLE red 10 reps ea for 2 sets Standing with locked rollator walker support:  abduction red band around knees 10 reps ea LE. Alternating LEs. Leg Press BLEs 50# 10  reps 2 sets [name] answered correctly have 2.1kg (wt of prosthesis) subtracted from weigh-in and weigh-out at dialysis.     [name] [name] EDUCATION:  Education details: See above Person educated: [name] [name] Education method: Explanation, Demonstration, Tactile cues, Verbal cues, and Handouts Education comprehension: verbalized [name], returned demonstration, verbal cues required, tactile cues required, and needs further education  HOME EXERCISE [name]: Access Code: [email or web address] URL: [email or web address] Date: 02/23/2024 Prepared by: Vladimir Faster  Exercises - Supine Quadricep Sets  - 5 x daily - 7 x weekly - 2 sets - 10 reps - 5 second hold - Seated Long Arc Quad  - 1 x daily - 4 x weekly - 2 sets - 10 reps - 5 seconds hold - Standing Hip Abduction Alternating legs  - 1 x daily - 4 x weekly - 2 sets - 10 reps - 5 seconds hold - Alternating Hip Flexion Forward Kicks  - 1 x daily - 4 x weekly - 2 sets - 10 reps - 5 seconds hold - Standing Hip Extension with Counter Support  - 1 x daily - 4 x weekly - 2 sets - 10 reps - 5 seconds hold  ASSESSMENT:  CLINICAL IMPRESSION: [name] [name] appears  [name] with both quad & hamstring activities.  No changes noted in circumferential girth measurements.  [name] [name].  [name] [name] reports no changes in pain.   [name] [name] continues to benefit from skilled [name].    Evaluation on 02/04/2024:  [name] [name] is a 53 y.o. male who was seen today for physical therapy evaluation and treatment for M25.561,G89.29 (ICD-10-CM) - Chronic pain of right knee.  [name] [name] had a fall in November 2024 that resulted in a hip fracture.  His knee pain has been present since that time.  It is very likely that [name] [name] right knee pain is related to the decrease in activity, strength and atrophy related to this fall and fracture.  Manual palpation of quadriceps sets shows significantly decreased right quadriceps activation as compared to the left and significant atrophy is noted of the right thigh.   [name] [name] was started on an appropriate home exercise [name] to improve quadricep strength, knee pain and allow him to improve his standing and walking endurance.  Given the 38-month history of this problem, and because of his other medical issues including severe diabetes and a below-knee amputation, [name] [name] may require a longer than normal course of rehabilitation.  OBJECTIVE IMPAIRMENTS: Abnormal gait, cardiopulmonary status limiting activity, decreased activity tolerance, decreased balance, decreased endurance, decreased knowledge of condition, difficulty walking, decreased ROM, decreased strength, increased edema, impaired perceived functional ability, and pain.   ACTIVITY LIMITATIONS: carrying, bending, standing, squatting, stairs, transfers, and locomotion level  PARTICIPATION LIMITATIONS: community activity  PERSONAL FACTORS: Type 2 diabetes, HLD, HTN, peripheral artery disease, Rt below knee amputation, left toe amputation, end stage renal disease, right hip fracture in 09/2023 are also affecting [name] [name]'s functional outcome.   REHAB POTENTIAL: Good  CLINICAL DECISION MAKING: Stable/uncomplicated  EVALUATION COMPLEXITY: Moderate   GOALS: Goals reviewed with [name] [name]? Yes  SHORT TERM GOALS: Target date: 03/03/2024 [name] [name]: Started 02/04/2024 Goal status: MET  02/28/2024  2.  Improve right quadriceps strength as assessed by improved quadriceps activation as assessed manually by the physical therapist during a quadriceps set exercise [name]: Poor activation at evaluation Goal status: MET 4/7/20255   LONG TERM GOALS: Target date: 03/31/2024  Improve [name] [name] specific functional scale to at least 50% for walking in home & community and picking up items [name]: 0% Goal status: Ongoing  02/28/2024  2.  Kenson will report right knee pain no greater than 3 out of 10 on the visual analog scale [name]: As high as 10 out of  10 Goal status: Ongoing  02/28/2024  3.  Ka will be able to complete a set of 10 consecutive seated straight leg raises, demonstrating improved quadriceps strength [name]: Unable at evaluation Goal status: Ongoing  02/28/2024  4.  Kowen will report improved standing and walking endurance as compared to evaluation [name]: Significantly increased right knee pain immediately upon standing which severely limits standing and walking endurance Goal status: Ongoing  02/28/2024  5.  Nicholad will be [name] with his long-term maintenance home exercise [name] [name]: Started 02/04/2024 Goal status: Ongoing 02/28/2024  PLAN:  [name] FREQUENCY: 1-2x/week  [name] DURATION: 8 weeks  PLANNED INTERVENTIONS: 97110-Therapeutic exercises, 97530- Therapeutic activity, 97112- Neuromuscular re-education, 97535- Self Care, 16109- Manual therapy, [phone]- Gait training, [phone]- Prosthetic training, [phone]- Vasopneumatic device, [name] [name]/Family education, Balance training, Stair training, and Cryotherapy  PLAN FOR NEXT SESSION:    continue therapeutic exercise and therapeutic activities.  Standing balance   Vladimir Faster, [name], DPT 02/28/2024, 1:43 PM

## 2024-02-29 DIAGNOSIS — D689 Coagulation defect, unspecified: Secondary | ICD-10-CM | POA: Diagnosis not present

## 2024-02-29 DIAGNOSIS — Z992 Dependence on renal dialysis: Secondary | ICD-10-CM | POA: Diagnosis not present

## 2024-02-29 DIAGNOSIS — D631 Anemia in chronic kidney disease: Secondary | ICD-10-CM | POA: Diagnosis not present

## 2024-02-29 DIAGNOSIS — T8249XA Other complication of vascular dialysis catheter, initial encounter: Secondary | ICD-10-CM | POA: Diagnosis not present

## 2024-02-29 DIAGNOSIS — N2581 Secondary hyperparathyroidism of renal origin: Secondary | ICD-10-CM | POA: Diagnosis not present

## 2024-02-29 DIAGNOSIS — I132 Hypertensive heart and chronic kidney disease with heart failure and with stage 5 chronic kidney disease, or end stage renal disease: Secondary | ICD-10-CM | POA: Diagnosis not present

## 2024-02-29 DIAGNOSIS — N186 End stage renal disease: Secondary | ICD-10-CM | POA: Diagnosis not present

## 2024-02-29 DIAGNOSIS — E1122 Type 2 diabetes mellitus with diabetic chronic kidney disease: Secondary | ICD-10-CM | POA: Diagnosis not present

## 2024-03-01 ENCOUNTER — Encounter: Payer: Self-pay | Admitting: Physical Therapy

## 2024-03-01 ENCOUNTER — Other Ambulatory Visit: Payer: Self-pay | Admitting: Student

## 2024-03-01 ENCOUNTER — Ambulatory Visit: Admitting: Physical Therapy

## 2024-03-01 DIAGNOSIS — M25561 Pain in right knee: Secondary | ICD-10-CM | POA: Diagnosis not present

## 2024-03-01 DIAGNOSIS — R262 Difficulty in walking, not elsewhere classified: Secondary | ICD-10-CM | POA: Diagnosis not present

## 2024-03-01 DIAGNOSIS — R269 Unspecified abnormalities of gait and mobility: Secondary | ICD-10-CM

## 2024-03-01 DIAGNOSIS — M6281 Muscle weakness (generalized): Secondary | ICD-10-CM | POA: Diagnosis not present

## 2024-03-01 DIAGNOSIS — G8929 Other chronic pain: Secondary | ICD-10-CM | POA: Diagnosis not present

## 2024-03-01 DIAGNOSIS — R2681 Unsteadiness on feet: Secondary | ICD-10-CM

## 2024-03-01 DIAGNOSIS — R296 Repeated falls: Secondary | ICD-10-CM

## 2024-03-01 NOTE — Therapy (Signed)
 OUTPATIENT PHYSICAL THERAPY LOWER EXTREMITY TREATMENT   Patient Name: [name] [name] MRN: [id number] DOB:[birth year], 53 y.o., male Today's Date: 03/01/2024  END OF SESSION:  PT End of Session - 03/01/24 1017     Visit Number 6    Number of Visits 16    Date for PT Re-Evaluation 03/31/24    Authorization Type [employer] dual complete no authorization needed  20% coinsurance    Progress Note Due on Visit 10    PT Start Time 1018    PT Stop Time 1057    PT Time Calculation (min) 39 min    Activity Tolerance Patient tolerated treatment well;No increased pain;Patient limited by pain    Behavior During Therapy [facility] for tasks assessed/performed                 Past Medical History:  Diagnosis Date   Abdominal pain 11/30/2023   Abdominal tenderness 10/14/2023   Allergy    seasonal and environmental   Anemia    Chest pain 12/01/2023   Chronic constipation 05/13/2020   Depression    Diabetes mellitus without complication (HCC) 2019   Dyspnea    hx - SOB with exertion   ESRD on hemodialysis (HCC)    Tues, Thurs, Sat   GERD (gastroesophageal reflux disease)    Hyperlipidemia    Hypertension    Necrotizing fasciitis (HCC) 10/13/2017   Neuromuscular disorder (HCC)    neuropathy   PAD (peripheral artery disease) (HCC)    Secondary hyperparathyroidism (HCC)    SVT (supraventricular tachycardia) (HCC)    hx   Wound dehiscence 11/24/2017   Past Surgical History:  Procedure Laterality Date   AMPUTATION Right 10/13/2017   Procedure: RIGHT BELOW KNEE AMPUTATION;  Surgeon: Nadara Mustard, MD;  Location: [facility] OR;  Service: Orthopedics;  Laterality: Right;   AMPUTATION Right 11/24/2017   Procedure: AMPUTATION BELOW KNEE REVISION;  Surgeon: Nadara Mustard, MD;  Location: [facility] OR;  Service: Orthopedics;  Laterality: Right;   AMPUTATION TOE Left    AV FISTULA PLACEMENT Left 10/19/2023   Procedure: LEFT BRACHIOCEPHALIC ARTERIOVENOUS (AV) FISTULA CREATION;  Surgeon: Daria Pastures, MD;   Location: [facility] OR;  Service: Vascular;  Laterality: Left;   AV FISTULA PLACEMENT Left 01/26/2024   Procedure: LEFT ARTERIOVENOUS (AV) FISTULA CREATION;  Surgeon: Daria Pastures, MD;  Location: [facility] OR;  Service: Vascular;  Laterality: Left;   BIOPSY  09/02/2021   Procedure: BIOPSY;  Surgeon: Imogene Burn, MD;  Location: [facility] ENDOSCOPY;  Service: Gastroenterology;;   BIOPSY  02/18/2023   Procedure: BIOPSY;  Surgeon: Benancio Deeds, MD;  Location: [facility] ENDOSCOPY;  Service: Gastroenterology;;   COLONOSCOPY  05/11/2019   COLONOSCOPY  2021   3 polyps - tubular adenomas   ESOPHAGOGASTRODUODENOSCOPY (EGD) WITH PROPOFOL N/A 09/02/2021   Procedure: ESOPHAGOGASTRODUODENOSCOPY (EGD) WITH PROPOFOL;  Surgeon: Imogene Burn, MD;  Location: [facility] ENDOSCOPY;  Service: Gastroenterology;  Laterality: N/A;   ESOPHAGOGASTRODUODENOSCOPY (EGD) WITH PROPOFOL N/A 02/18/2023   Procedure: ESOPHAGOGASTRODUODENOSCOPY (EGD) WITH PROPOFOL;  Surgeon: Benancio Deeds, MD;  Location: [facility] ENDOSCOPY;  Service: Gastroenterology;  Laterality: N/A;   INTRAMEDULLARY (IM) NAIL INTERTROCHANTERIC Right 10/15/2023   Procedure: INTRAMEDULLARY (IM) NAIL INTERTROCHANTERIC;  Surgeon: Roby Lofts, MD;  Location: [facility] OR;  Service: Orthopedics;  Laterality: Right;   IR FLUORO GUIDE CV LINE RIGHT  10/15/2023   IR US GUIDE VASC ACCESS RIGHT  10/15/2023   POLYPECTOMY     WISDOM TOOTH EXTRACTION     Patient Active  Problem List   Diagnosis Date Noted   Nausea and vomiting 01/26/2024   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/25/2024   Depression 01/24/2024   Hypertensive urgency 01/24/2024   Hypotension 12/01/2023   Altered mental status 11/30/2023   ESRD (end stage renal disease) on dialysis (HCC) 11/30/2023   Right knee pain 10/24/2023   Nonimmune to hepatitis B virus 10/22/2023   Closed fracture of right hip (HCC) 10/15/2023   T2DM (type 2 diabetes mellitus) (HCC) 10/14/2023   Urinary retention 10/14/2023   Closed intertrochanteric fracture of  right hip (HCC) 10/13/2023   Patellar tendinitis 10/12/2023   Wrist sprain, left, initial encounter 10/12/2023   Contracture, 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   Mucosal abnormality of esophagus 02/18/2023   Foot pain, left 02/17/2023   SOB (shortness of breath) 02/16/2023   History of esophagitis 02/16/2023   Tachycardia 02/15/2023   Allergic rhinitis 11/07/2022   Chronic kidney disease, stage 5 (HCC) 09/11/2022   Tobacco dependence 09/11/2022   GERD with esophagitis 09/02/2021   Acute kidney injury superimposed on CKD (HCC) 04/10/2021   Achilles tendon contracture, left 03/03/2018   Non-pressure chronic ulcer of other part of left foot limited to breakdown of skin (HCC) 02/02/2018   History of right below knee amputation (HCC) 12/22/2017   Acute posthemorrhagic anemia    Falls 11/24/2017   Severe hypertension 11/24/2017   Anemia 11/24/2017   Uncontrolled type 2 diabetes mellitus with hyperglycemia, with long-term current use of insulin (HCC) 10/11/2017   ARF (acute renal failure) (HCC) 10/11/2017    PCP: [name], MD  REFERRING PROVIDER: Nadara Mustard, MD  REFERRING DIAG: [phone] (ICD-10-CM) - Chronic pain of right knee  THERAPY DIAG:  Difficulty in walking, not elsewhere classified  Unsteadiness on feet  Repeated falls  Chronic pain of right knee  Abnormality of gait and mobility  Muscle weakness (generalized)  Rationale for Evaluation and Treatment: Rehabilitation  ONSET DATE: 09/2023   SUBJECTIVE:   SUBJECTIVE STATEMENT: Yesterday he fell out of chair with prosthesis off.  It happened when shower chair broke. It was first time sitting on that chair.  No injuries. He used bars to get onto raised toilet seat.       PERTINENT HISTORY: Type 2 diabetes, HLD, HTN, peripheral artery disease, Rt below knee amputation, left toe amputation, end stage renal disease, right hip fracture in 09/2023  PAIN:  Are you having pain? Yes: NPRS scale: today 10/10 and up to  10/10 this week but 9/10 earlier in week Pain location: Super-medial knee and above the knee cap Pain description: Sharp, worsens with increased weight-bearing Aggravating factors: Standing and walking, stand to sit transition, knee flexion and hip flexion Relieving factors: Getting off the knee  PRECAUTIONS:  NO BP LUE, fall risk  RED FLAGS: None   WEIGHT BEARING RESTRICTIONS:  Nadav notes increased WB increases symptoms  FALLS:  Has patient fallen in last 6 months?  Yes, cause of initial injury but no falls since that time.  LIVING ENVIRONMENT: Lives with: lives with their family Lives in: Mobile home Stairs:  Needs handrails Has following equipment at home: Single point cane, Environmental consultant - 4 wheeled, Wheelchair (manual), shower chair, and shower bars  OCCUPATION: Disability, drive, racing  PLOF:  Had 3 falls previous before the fall that resulted in a fracture   PATIENT GOALS: Be able to walk safely without the walker and with less knee pain  NEXT MD VISIT: 04/03/2024  OBJECTIVE:  Note: Objective measures were completed at Evaluation unless otherwise  noted.  DIAGNOSTIC FINDINGS: 1. No meniscal or ligamentous injury of the right knee. 2. Mild osseous contusion of the posteromedial tibial plateau. 3. Severe edema in infrapatellar Hoffa's fat likely reflecting a contusion secondary to direct trauma.  PATIENT SURVEYS:  Patient-Specific Activity Scoring Scheme  "0" represents "unable to perform." "10" represents "able to perform at prior level. 0 1 2 3 4 5 6 7 8 9  10 (Date and Score)   Activity 02/16/2024    1. Walking with cane in community  0    2. Walking in house without device  0    3. Picking up items from floor 0   4.    5.    Score 0    Total score = sum of the activity scores/number of activities Minimum detectable change (90%CI) for average score = 2 points Minimum detectable change (90%CI) for single activity score = 3 points    COGNITION: Overall  cognitive status: Within functional limits for tasks assessed     SENSATION: Diabetic neuropathy of the left foot and some tingling above the right knee  EDEMA:  Mana notes swelling his left side but he gets off the right side before swelling is an issue  LOWER EXTREMITY ROM:  Active ROM Left/Right 02/04/2024   Hip flexion    Hip extension    Hip abduction    Hip adduction    Hip internal rotation    Hip external rotation    Knee flexion 140/118   Knee extension 0/0   Ankle dorsiflexion    Ankle plantarflexion    Ankle inversion    Ankle eversion     (Blank rows = not tested)  LOWER EXTREMITY STRENGTH:  Strength was assessed using circumferential thigh girth 15 cm proximal to the superior patellar pole Left/Right 02/04/2024 02/28/2024 Left/Right  Hip flexion    Hip extension    Hip abduction    Hip adduction    Hip internal rotation    Hip external rotation    Knee flexion    Knee extension 48.5 cm/47.0 cm 10-15% strength deficit per centimeter of atrophy 48.5 cm/47.0 cm 10-15% strength deficit per centimeter of atrophy  Ankle dorsiflexion    Ankle plantarflexion    Ankle inversion    Ankle eversion     (Blank rows = not tested)  GAIT: Evaluation on 02/04/2024: Distance walked: 50 feet Assistive device utilized: Environmental consultant - 4 wheeled Level of assistance: Complete Independence Comments: Shanard notes he has increased knee pain immediately upon standing.  He is very limited in his standing and walking endurance as a result.  Prosthetic Wear & Care: 02/16/2024:  He is wearing prosthesis from arising to bedtime.   No skin issues noted.                                                                                                                             TREATMENT DATE:  03/01/2024  Therapeutic Exercise: Nustep seat 15  Level 7 with BLEs & BUEs for 4  min, then level 6 BLEs only 4 min Seated LAQ 10 reps 2 sets ea LE.  RLE full range without assist no weight and LLE  2#. Seated hamstring curl RLE red Theraband with assist (only last 25% today, was last 50%) concentric for full range & controlled eccentric 10 reps 1 sets; LLE green theraband 10 reps 1 sets.   Therapeutic Activities: Leg Press BLEs 56# 10 reps 2 sets;  single leg 25# LLE & 12# RLE (unable to move with 25# or 18#) 10 reps ea LE Standing with locked rollator walker support: hip flexion SLR & hip ext with RLE red Tband & LLE green Tband 10 reps 2 sets ea LE. Standing with locked rollator walker support:  abduction red band around knees 10 reps ea LE. Alternating LEs.   TREATMENT DATE:  02/28/2024  Therapeutic Exercise: Nustep seat 15 Level 7 with BLEs & BUEs for 4  min, then level 6 BLEs only 4 min Seated LAQ 10 reps 2 sets ea LE.  RLE full range without assist no weight and LLE 2#. Seated hamstring curl RLE red Theraband with assist (only last 25% today, was last 50%) concentric for full range & controlled eccentric 10 reps 1 sets; LLE green theraband 10 reps 1 sets.   Therapeutic Activities Standing with locked rollator walker support: hip flexion SLR & hip ext with RLE red Tband & LLE green Tband 15 reps ea alternating LEs. Standing with locked rollator walker support:  abduction red band around knees 10 reps ea LE. Alternating LEs. Leg Press BLEs 56# 10 reps 2 sets;  single leg 25# LLE & 12# RLE (unable to move with 25# or 18#) 10 reps ea LE   TREATMENT DATE:  02/23/2024  Therapeutic Exercise: Nustep seat 15 Level 7 with BLEs & BUEs for 4  min, then level 4 BLEs only 4 min Seated LAQ 10 reps 2 sets ea LE.  RLE full range without assist today.  Seated hamstring curl RLE red Theraband with assist (only last 25% today, was last 50%) concentric for full range & controlled eccentric 10 reps 1 sets; LLE green theraband 10 reps 1 sets.  PT updated HEP (see below) with HO, demo & verbal cues.  Pt verbalized understanding.   Therapeutic Activities Standing with locked rollator walker support:  hip flexion SLR & hip ext with RLE yellow & LLE red 10 reps ea for 2 sets Standing with locked rollator walker support:  abduction red band around knees 10 reps ea LE. Alternating LEs. Leg Press BLEs 56# 10 reps 2 sets;  single leg 25# LLE & 12# RLE (unable to move with 25# or 18#) 10 reps ea LE      PATIENT EDUCATION:  Education details: See above Person educated: Patient Education method: Explanation, Demonstration, Tactile cues, Verbal cues, and Handouts Education comprehension: verbalized understanding, returned demonstration, verbal cues required, tactile cues required, and needs further education  HOME EXERCISE PROGRAM: Access Code: [email or web address] URL: [email or web address] Date: 02/23/2024 Prepared by: Vladimir Faster  Exercises - Supine Quadricep Sets  - 5 x daily - 7 x weekly - 2 sets - 10 reps - 5 second hold - Seated Long Arc Quad  - 1 x daily - 4 x weekly - 2 sets - 10 reps - 5 seconds hold - Standing Hip Abduction Alternating legs  - 1 x daily - 4 x weekly - 2 sets - 10 reps - 5 seconds hold - Alternating  Hip Flexion Forward Kicks  - 1 x daily - 4 x weekly - 2 sets - 10 reps - 5 seconds hold - Standing Hip Extension with Counter Support  - 1 x daily - 4 x weekly - 2 sets - 10 reps - 5 seconds hold  ASSESSMENT:  CLINICAL IMPRESSION: Patient fatigues quickly but is tolerating strengthening activities.  He has improved his strength but atrophy still present.  Research says 6-10 weeks for hypertrophy occurs.    He continues to benefit from skilled PT.    Evaluation on 02/04/2024:  Patient is a 53 y.o. male who was seen today for physical therapy evaluation and treatment for M25.561,G89.29 (ICD-10-CM) - Chronic pain of right knee.  Jerame had a fall in November 2024 that resulted in a hip fracture.  His knee pain has been present since that time.  It is very likely that Strider's right knee pain is related to the decrease in activity, strength and atrophy related to this  fall and fracture.  Manual palpation of quadriceps sets shows significantly decreased right quadriceps activation as compared to the left and significant atrophy is noted of the right thigh.  Jadan was started on an appropriate home exercise program to improve quadricep strength, knee pain and allow him to improve his standing and walking endurance.  Given the 57-month history of this problem, and because of his other medical issues including severe diabetes and a below-knee amputation, Wille Celeste may require a longer than normal course of rehabilitation.  OBJECTIVE IMPAIRMENTS: Abnormal gait, cardiopulmonary status limiting activity, decreased activity tolerance, decreased balance, decreased endurance, decreased knowledge of condition, difficulty walking, decreased ROM, decreased strength, increased edema, impaired perceived functional ability, and pain.   ACTIVITY LIMITATIONS: carrying, bending, standing, squatting, stairs, transfers, and locomotion level  PARTICIPATION LIMITATIONS: community activity  PERSONAL FACTORS: Type 2 diabetes, HLD, HTN, peripheral artery disease, Rt below knee amputation, left toe amputation, end stage renal disease, right hip fracture in 09/2023 are also affecting patient's functional outcome.   REHAB POTENTIAL: Good  CLINICAL DECISION MAKING: Stable/uncomplicated  EVALUATION COMPLEXITY: Moderate   GOALS: Goals reviewed with patient? Yes  SHORT TERM GOALS: Target date: 03/03/2024 Arjen will be independent with his day 1 home exercise program Baseline: Started 02/04/2024 Goal status: MET  02/28/2024  2.  Improve right quadriceps strength as assessed by improved quadriceps activation as assessed manually by the physical therapist during a quadriceps set exercise Baseline: Poor activation at evaluation Goal status: MET 4/7/20255   LONG TERM GOALS: Target date: 03/31/2024  Improve patient specific functional scale to at least 50% for walking in home & community and  picking up items Baseline: 0% Goal status: Ongoing  02/28/2024  2.  Jamerius will report right knee pain no greater than 3 out of 10 on the visual analog scale Baseline: As high as 10 out of 10 Goal status: Ongoing  02/28/2024  3.  Sayid will be able to complete a set of 10 consecutive seated straight leg raises, demonstrating improved quadriceps strength Baseline: Unable at evaluation Goal status: Ongoing  02/28/2024  4.  Jevon will report improved standing and walking endurance as compared to evaluation Baseline: Significantly increased right knee pain immediately upon standing which severely limits standing and walking endurance Goal status: Ongoing  02/28/2024  5.  Ashvik will be independent with his long-term maintenance home exercise program Baseline: Started 02/04/2024 Goal status: Ongoing 02/28/2024  PLAN:  PT FREQUENCY: 1-2x/week  PT DURATION: 8 weeks  PLANNED INTERVENTIONS: 97110-Therapeutic exercises, 97530- Therapeutic  activity, [id number]- Neuromuscular re-education, [phone]- Self Care, 91478- Manual therapy, [phone]- Gait training, [phone]- Prosthetic training, [phone]- Vasopneumatic device, Patient/Family education, Balance training, Stair training, and Cryotherapy  PLAN FOR NEXT SESSION:       continue therapeutic exercise and therapeutic activities.  Standing balance   Vladimir Faster, PT, DPT 03/01/2024, 11:08 AM

## 2024-03-02 DIAGNOSIS — N2581 Secondary hyperparathyroidism of renal origin: Secondary | ICD-10-CM | POA: Diagnosis not present

## 2024-03-02 DIAGNOSIS — E1122 Type 2 diabetes mellitus with diabetic chronic kidney disease: Secondary | ICD-10-CM | POA: Diagnosis not present

## 2024-03-02 DIAGNOSIS — N186 End stage renal disease: Secondary | ICD-10-CM | POA: Diagnosis not present

## 2024-03-02 DIAGNOSIS — D689 Coagulation defect, unspecified: Secondary | ICD-10-CM | POA: Diagnosis not present

## 2024-03-02 DIAGNOSIS — Z992 Dependence on renal dialysis: Secondary | ICD-10-CM | POA: Diagnosis not present

## 2024-03-02 DIAGNOSIS — D631 Anemia in chronic kidney disease: Secondary | ICD-10-CM | POA: Diagnosis not present

## 2024-03-02 DIAGNOSIS — T8249XA Other complication of vascular dialysis catheter, initial encounter: Secondary | ICD-10-CM | POA: Diagnosis not present

## 2024-03-02 DIAGNOSIS — I132 Hypertensive heart and chronic kidney disease with heart failure and with stage 5 chronic kidney disease, or end stage renal disease: Secondary | ICD-10-CM | POA: Diagnosis not present

## 2024-03-03 ENCOUNTER — Ambulatory Visit (INDEPENDENT_AMBULATORY_CARE_PROVIDER_SITE_OTHER): Admitting: Physician Assistant

## 2024-03-03 ENCOUNTER — Ambulatory Visit (HOSPITAL_COMMUNITY)
Admission: RE | Admit: 2024-03-03 | Discharge: 2024-03-03 | Disposition: A | Discharge status: Home / Self Care | Source: Ambulatory Visit | Attending: Vascular Surgery | Admitting: Vascular Surgery

## 2024-03-03 VITALS — BP 178/86 | HR 81 | Temp 98.0°F | Resp 20 | Ht 77.0 in | Wt 238.0 lb

## 2024-03-03 DIAGNOSIS — N186 End stage renal disease: Secondary | ICD-10-CM

## 2024-03-03 DIAGNOSIS — Z992 Dependence on renal dialysis: Secondary | ICD-10-CM

## 2024-03-03 NOTE — Progress Notes (Signed)
 POST OPERATIVE OFFICE NOTE    CC:  F/u for surgery  HPI:  [name] [name] is a 53 y.o. male who presents today for postop check.  He recently underwent left first stage brachiobasilic fistula creation on 01/26/2024 by Dr. Susi Eric.  Postoperatively he developed a seroma around his incision and edema of the left forearm and hand.  Pt returns today for follow up. He has no complaints.  He says the seroma around his incision has gotten much smaller.  He says he has been elevating his arm daily, however his left forearm and hand swelling has not gotten better.  He gets dialysis on Tuesdays, Thursdays, and Saturdays via right IJ TDC.  Allergies  Allergen Reactions   Iron Nausea And Vomiting    With the iron tablets   Tape Rash    Rash at site of tape--okay to use paper tape    Current Outpatient Medications  Medication Sig Dispense Refill   acetaminophen (TYLENOL) 500 MG tablet Take 2 tablets (1,000 mg total) by mouth every 6 (six) hours as needed. (Patient taking differently: Take 1,000 mg by mouth every 6 (six) hours as needed for mild pain (pain score 1-3).) 30 tablet 0   AGAMATRIX ULTRA-THIN LANCETS MISC Check blood sugars before meals twice daily 100 each 11   atorvastatin (LIPITOR) 40 MG tablet Take 1 tablet (40 mg total) by mouth at bedtime. (Patient taking differently: Take 40 mg by mouth daily.) 90 tablet 0   Blood Glucose Monitoring Suppl (ACCU-CHEK GUIDE ME) w/Device KIT See admin instructions.     calcitRIOL (ROCALTROL) 0.25 MCG capsule Take 1 capsule (0.25 mcg total) by mouth Every Tuesday,Thursday,and Saturday with dialysis. 30 capsule 0   Darbepoetin Alfa (ARANESP) 150 MCG/0.3ML SOSY injection Inject 0.3 mLs (150 mcg total) into the skin every Saturday at 6 PM. (Patient taking differently: Inject 150 mcg into the skin every Saturday at 6 PM. At dialysis)     diclofenac Sodium (VOLTAREN) 1 % GEL Apply 4 g topically 4 (four) times daily. (Patient taking differently: Apply 4 g  topically 4 (four) times daily as needed (pain).) 100 g 0   diltiazem (CARDIZEM CD) 300 MG 24 hr capsule TAKE 1 CAPSULE(300 MG) BY MOUTH DAILY 90 capsule 0   FLUoxetine (PROZAC) 20 MG capsule TAKE 1 CAPSULE(20 MG) BY MOUTH DAILY 30 capsule 0   gabapentin (NEURONTIN) 300 MG capsule TAKE 1 CAPSULE(300 MG) BY MOUTH DAILY 30 capsule 0   glucose blood (AGAMATRIX PRESTO TEST) test strip Check blood sugars twice daily before meals 100 each 12   glucose blood test strip 1 each by Other route as needed for other. Use as instructed     hydrALAZINE (APRESOLINE) 50 MG tablet Take 1 tablet (50 mg total) by mouth 2 (two) times daily. (Patient taking differently: Take 100 mg by mouth 2 (two) times daily.) 30 tablet 0   insulin lispro (HUMALOG) 100 UNIT/ML injection 0-6 Units, Subcutaneous, 3 times daily with meals, First dose on Sun 10/17/23 at 0800 Correction coverage: Very Sensitive (ESRD/Dialysis) CBG < 70: Implement Hypoglycemia Standing Orders and refer to Hypoglycemia Standing Orders sidebar report CBG 70 - 120: 0 units CBG 121 - 150: 0 units CBG 151 - 200: 1 unit CBG 201-250: 2 units CBG 251-300: 3 units CBG 301-350: 4 units CBG 351-400: 5 units CBG > 400: Give 6 units and call MD (Patient taking differently: 0-6 Units, Subcutaneous, 3 times daily with meals, as needed  Correction coverage: Very Sensitive (ESRD/Dialysis) CBG < 70:  Implement Hypoglycemia Standing Orders and refer to Hypoglycemia Standing Orders sidebar report CBG 70 - 120: 0 units CBG 121 - 150: 0 units CBG 151 - 200: 1 unit CBG 201-250: 2 units CBG 251-300: 3 units CBG 301-350: 4 units CBG 351-400: 5 units CBG > 400: Give 6 units and call MD)     lidocaine (HM LIDOCAINE PATCH) 4 % Place 1 patch onto the skin daily. (Patient taking differently: Place 1 patch onto the skin daily as needed (pain).) 30 patch 0   losartan (COZAAR) 25 MG tablet Take 1 tablet (25 mg total) by mouth daily. 90 tablet 0   meloxicam (MOBIC) 15 MG tablet  Take 15 mg by mouth daily.     metoprolol succinate (TOPROL-XL) 25 MG 24 hr tablet Take 25 mg by mouth daily.     ondansetron (ZOFRAN-ODT) 4 MG disintegrating tablet Take 1 tablet (4 mg total) by mouth every 8 (eight) hours as needed for nausea or vomiting. 20 tablet 0   oxyCODONE-acetaminophen (PERCOCET) 10-325 MG tablet Take 1 tablet by mouth every 8 (eight) hours as needed for pain.     pantoprazole (PROTONIX) 40 MG tablet Take 1 tablet (40 mg total) by mouth daily. 90 tablet 0   sevelamer carbonate (RENVELA) 800 MG tablet Take 2 tablets (1,600 mg total) by mouth 3 (three) times daily with meals. (Patient taking differently: Take 1,600 mg by mouth 2 (two) times daily with a meal.) 180 tablet 0   tamsulosin (FLOMAX) 0.4 MG CAPS capsule TAKE 1 CAPSULE(0.4 MG) BY MOUTH DAILY AFTER SUPPER (Patient taking differently: Take 0.4 mg by mouth daily as needed (bladder control).) 90 capsule 0   tiZANidine (ZANAFLEX) 4 MG tablet TAKE 1/2 TABLET(2 MG) BY MOUTH DAILY AS NEEDED FOR MUSCLE SPASMS 30 tablet 0   No current facility-administered medications for this visit.     ROS:  See HPI  Physical Exam:  Incision: left arm incision well healed with small seroma Extremities: palpable left radial pulse. Great thrill in left upper arm fistula. Edematous left forearm and hand Neuro: intact motor and sensation of left hand with baseline contracture of the 4th and 5th fingers  Studies Dialysis Duplex (03/03/2024) ----+  AVF                PSV (cm/s)Flow Vol (mL/min)Comments  +--------------------+----------+-----------------+--------+  Native artery inflow   334          1813                 +--------------------+----------+-----------------+--------+  AVF Anastomosis        407                               +--------------------+----------+-----------------+--------+     +------------+----------+-------------+----------+----------------+  OUTFLOW VEINPSV (cm/s)Diameter (cm)Depth (cm)     Describe      +------------+----------+-------------+----------+----------------+  Prox UA        358        0.84        1.10                     +------------+----------+-------------+----------+----------------+  Mid UA         535        0.51        1.26   competing branch  +------------+----------+-------------+----------+----------------+  Dist UA        368        0.72  1.24                     +------------+----------+-------------+----------+----------------+  AC Fossa       420        0.52        1.86                     +------------+----------+-------------+----------+----------------+    Assessment/Plan:  This is a 53 y.o. male who is here for post op check  -The patient recently underwent left first stage brachiobasilic fistula creation on 01/26/2024.  Postoperatively he developed significant left forearm and hand swelling -On exam his left upper arm fistula has a great thrill in it.  He does have a small seroma around his incision, however this has gotten smaller with time.  He continues to have significant edema of the left forearm and hand -Duplex demonstrates a well maturing fistula with great flow volumes of 1813 mL/min.  The fistula is almost completely mature in size -He has a palpable left radial pulse without any symptoms of steal -I have explained to the patient that his persistent left forearm and hand swelling after surgery is likely due to central venous stenosis.  He would benefit from fistulogram with possible venous balloon angioplasty versus stenting prior to his second stage surgery.  He is agreeable to fistulogram. -We will plan for LUE fistulogram with Dr.Buckley in 2-3 wks   Deneise Finlay, PA-C [facility] [phone]   Clinic MD:  Susi Eric

## 2024-03-03 NOTE — H&P (View-Only) (Signed)
 POST OPERATIVE OFFICE NOTE    CC:  F/u for surgery  HPI:  [name] [name] is a 53 y.o. male who presents today for postop check.  He recently underwent left first stage brachiobasilic fistula creation on 01/26/2024 by Dr. Susi Eric.  Postoperatively he developed a seroma around his incision and edema of the left forearm and hand.  Pt returns today for follow up. He has no complaints.  He says the seroma around his incision has gotten much smaller.  He says he has been elevating his arm daily, however his left forearm and hand swelling has not gotten better.  He gets dialysis on Tuesdays, Thursdays, and Saturdays via right IJ TDC.  Allergies  Allergen Reactions   Iron Nausea And Vomiting    With the iron tablets   Tape Rash    Rash at site of tape--okay to use paper tape    Current Outpatient Medications  Medication Sig Dispense Refill   acetaminophen (TYLENOL) 500 MG tablet Take 2 tablets (1,000 mg total) by mouth every 6 (six) hours as needed. (Patient taking differently: Take 1,000 mg by mouth every 6 (six) hours as needed for mild pain (pain score 1-3).) 30 tablet 0   AGAMATRIX ULTRA-THIN LANCETS MISC Check blood sugars before meals twice daily 100 each 11   atorvastatin (LIPITOR) 40 MG tablet Take 1 tablet (40 mg total) by mouth at bedtime. (Patient taking differently: Take 40 mg by mouth daily.) 90 tablet 0   Blood Glucose Monitoring Suppl (ACCU-CHEK GUIDE ME) w/Device KIT See admin instructions.     calcitRIOL (ROCALTROL) 0.25 MCG capsule Take 1 capsule (0.25 mcg total) by mouth Every Tuesday,Thursday,and Saturday with dialysis. 30 capsule 0   Darbepoetin Alfa (ARANESP) 150 MCG/0.3ML SOSY injection Inject 0.3 mLs (150 mcg total) into the skin every Saturday at 6 PM. (Patient taking differently: Inject 150 mcg into the skin every Saturday at 6 PM. At dialysis)     diclofenac Sodium (VOLTAREN) 1 % GEL Apply 4 g topically 4 (four) times daily. (Patient taking differently: Apply 4 g  topically 4 (four) times daily as needed (pain).) 100 g 0   diltiazem (CARDIZEM CD) 300 MG 24 hr capsule TAKE 1 CAPSULE(300 MG) BY MOUTH DAILY 90 capsule 0   FLUoxetine (PROZAC) 20 MG capsule TAKE 1 CAPSULE(20 MG) BY MOUTH DAILY 30 capsule 0   gabapentin (NEURONTIN) 300 MG capsule TAKE 1 CAPSULE(300 MG) BY MOUTH DAILY 30 capsule 0   glucose blood (AGAMATRIX PRESTO TEST) test strip Check blood sugars twice daily before meals 100 each 12   glucose blood test strip 1 each by Other route as needed for other. Use as instructed     hydrALAZINE (APRESOLINE) 50 MG tablet Take 1 tablet (50 mg total) by mouth 2 (two) times daily. (Patient taking differently: Take 100 mg by mouth 2 (two) times daily.) 30 tablet 0   insulin lispro (HUMALOG) 100 UNIT/ML injection 0-6 Units, Subcutaneous, 3 times daily with meals, First dose on Sun 10/17/23 at 0800 Correction coverage: Very Sensitive (ESRD/Dialysis) CBG < 70: Implement Hypoglycemia Standing Orders and refer to Hypoglycemia Standing Orders sidebar report CBG 70 - 120: 0 units CBG 121 - 150: 0 units CBG 151 - 200: 1 unit CBG 201-250: 2 units CBG 251-300: 3 units CBG 301-350: 4 units CBG 351-400: 5 units CBG > 400: Give 6 units and call MD (Patient taking differently: 0-6 Units, Subcutaneous, 3 times daily with meals, as needed  Correction coverage: Very Sensitive (ESRD/Dialysis) CBG < 70:  Implement Hypoglycemia Standing Orders and refer to Hypoglycemia Standing Orders sidebar report CBG 70 - 120: 0 units CBG 121 - 150: 0 units CBG 151 - 200: 1 unit CBG 201-250: 2 units CBG 251-300: 3 units CBG 301-350: 4 units CBG 351-400: 5 units CBG > 400: Give 6 units and call MD)     lidocaine (HM LIDOCAINE PATCH) 4 % Place 1 patch onto the skin daily. (Patient taking differently: Place 1 patch onto the skin daily as needed (pain).) 30 patch 0   losartan (COZAAR) 25 MG tablet Take 1 tablet (25 mg total) by mouth daily. 90 tablet 0   meloxicam (MOBIC) 15 MG tablet  Take 15 mg by mouth daily.     metoprolol succinate (TOPROL-XL) 25 MG 24 hr tablet Take 25 mg by mouth daily.     ondansetron (ZOFRAN-ODT) 4 MG disintegrating tablet Take 1 tablet (4 mg total) by mouth every 8 (eight) hours as needed for nausea or vomiting. 20 tablet 0   oxyCODONE-acetaminophen (PERCOCET) 10-325 MG tablet Take 1 tablet by mouth every 8 (eight) hours as needed for pain.     pantoprazole (PROTONIX) 40 MG tablet Take 1 tablet (40 mg total) by mouth daily. 90 tablet 0   sevelamer carbonate (RENVELA) 800 MG tablet Take 2 tablets (1,600 mg total) by mouth 3 (three) times daily with meals. (Patient taking differently: Take 1,600 mg by mouth 2 (two) times daily with a meal.) 180 tablet 0   tamsulosin (FLOMAX) 0.4 MG CAPS capsule TAKE 1 CAPSULE(0.4 MG) BY MOUTH DAILY AFTER SUPPER (Patient taking differently: Take 0.4 mg by mouth daily as needed (bladder control).) 90 capsule 0   tiZANidine (ZANAFLEX) 4 MG tablet TAKE 1/2 TABLET(2 MG) BY MOUTH DAILY AS NEEDED FOR MUSCLE SPASMS 30 tablet 0   No current facility-administered medications for this visit.     ROS:  See HPI  Physical Exam:  Incision: left arm incision well healed with small seroma Extremities: palpable left radial pulse. Great thrill in left upper arm fistula. Edematous left forearm and hand Neuro: intact motor and sensation of left hand with baseline contracture of the 4th and 5th fingers  Studies Dialysis Duplex (03/03/2024) ----+  AVF                PSV (cm/s)Flow Vol (mL/min)Comments  +--------------------+----------+-----------------+--------+  Native artery inflow   334          1813                 +--------------------+----------+-----------------+--------+  AVF Anastomosis        407                               +--------------------+----------+-----------------+--------+     +------------+----------+-------------+----------+----------------+  OUTFLOW VEINPSV (cm/s)Diameter (cm)Depth (cm)     Describe      +------------+----------+-------------+----------+----------------+  Prox UA        358        0.84        1.10                     +------------+----------+-------------+----------+----------------+  Mid UA         535        0.51        1.26   competing branch  +------------+----------+-------------+----------+----------------+  Dist UA        368        0.72  1.24                     +------------+----------+-------------+----------+----------------+  AC Fossa       420        0.52        1.86                     +------------+----------+-------------+----------+----------------+    Assessment/Plan:  This is a 53 y.o. male who is here for post op check  -The patient recently underwent left first stage brachiobasilic fistula creation on 01/26/2024.  Postoperatively he developed significant left forearm and hand swelling -On exam his left upper arm fistula has a great thrill in it.  He does have a small seroma around his incision, however this has gotten smaller with time.  He continues to have significant edema of the left forearm and hand -Duplex demonstrates a well maturing fistula with great flow volumes of 1813 mL/min.  The fistula is almost completely mature in size -He has a palpable left radial pulse without any symptoms of steal -I have explained to the patient that his persistent left forearm and hand swelling after surgery is likely due to central venous stenosis.  He would benefit from fistulogram with possible venous balloon angioplasty versus stenting prior to his second stage surgery.  He is agreeable to fistulogram. -We will plan for LUE fistulogram with Dr.Buckley in 2-3 wks   Deneise Finlay, PA-C [facility] [phone]   Clinic MD:  Susi Eric

## 2024-03-04 DIAGNOSIS — Z992 Dependence on renal dialysis: Secondary | ICD-10-CM | POA: Diagnosis not present

## 2024-03-04 DIAGNOSIS — D689 Coagulation defect, unspecified: Secondary | ICD-10-CM | POA: Diagnosis not present

## 2024-03-04 DIAGNOSIS — N2581 Secondary hyperparathyroidism of renal origin: Secondary | ICD-10-CM | POA: Diagnosis not present

## 2024-03-04 DIAGNOSIS — E1122 Type 2 diabetes mellitus with diabetic chronic kidney disease: Secondary | ICD-10-CM | POA: Diagnosis not present

## 2024-03-04 DIAGNOSIS — T8249XA Other complication of vascular dialysis catheter, initial encounter: Secondary | ICD-10-CM | POA: Diagnosis not present

## 2024-03-04 DIAGNOSIS — I132 Hypertensive heart and chronic kidney disease with heart failure and with stage 5 chronic kidney disease, or end stage renal disease: Secondary | ICD-10-CM | POA: Diagnosis not present

## 2024-03-04 DIAGNOSIS — D631 Anemia in chronic kidney disease: Secondary | ICD-10-CM | POA: Diagnosis not present

## 2024-03-04 DIAGNOSIS — N186 End stage renal disease: Secondary | ICD-10-CM | POA: Diagnosis not present

## 2024-03-06 ENCOUNTER — Other Ambulatory Visit: Payer: Self-pay

## 2024-03-06 ENCOUNTER — Telehealth: Payer: Self-pay

## 2024-03-06 ENCOUNTER — Ambulatory Visit (INDEPENDENT_AMBULATORY_CARE_PROVIDER_SITE_OTHER): Admitting: Rehabilitative and Restorative Service Providers"

## 2024-03-06 ENCOUNTER — Encounter: Payer: Self-pay | Admitting: Rehabilitative and Restorative Service Providers"

## 2024-03-06 DIAGNOSIS — R2681 Unsteadiness on feet: Secondary | ICD-10-CM

## 2024-03-06 DIAGNOSIS — M25561 Pain in right knee: Secondary | ICD-10-CM

## 2024-03-06 DIAGNOSIS — G8929 Other chronic pain: Secondary | ICD-10-CM | POA: Diagnosis not present

## 2024-03-06 DIAGNOSIS — R262 Difficulty in walking, not elsewhere classified: Secondary | ICD-10-CM

## 2024-03-06 DIAGNOSIS — R296 Repeated falls: Secondary | ICD-10-CM

## 2024-03-06 DIAGNOSIS — M6281 Muscle weakness (generalized): Secondary | ICD-10-CM

## 2024-03-06 DIAGNOSIS — Z992 Dependence on renal dialysis: Secondary | ICD-10-CM | POA: Diagnosis not present

## 2024-03-06 DIAGNOSIS — E1122 Type 2 diabetes mellitus with diabetic chronic kidney disease: Secondary | ICD-10-CM | POA: Diagnosis not present

## 2024-03-06 DIAGNOSIS — N186 End stage renal disease: Secondary | ICD-10-CM | POA: Diagnosis not present

## 2024-03-06 DIAGNOSIS — E1165 Type 2 diabetes mellitus with hyperglycemia: Secondary | ICD-10-CM | POA: Diagnosis not present

## 2024-03-06 DIAGNOSIS — Z794 Long term (current) use of insulin: Secondary | ICD-10-CM | POA: Diagnosis not present

## 2024-03-06 NOTE — Telephone Encounter (Signed)
 Attempted to call for surgery scheduling. LVM

## 2024-03-06 NOTE — Telephone Encounter (Signed)
 Patient returned call but stated that Mondays are not "good" for him.  Offered Friday with Dr. Fulton Job, after asking which day would be best.  Patient stated arrival time of 5:30a is too early.   Patient will call back when ready to schedule.

## 2024-03-06 NOTE — Therapy (Signed)
 OUTPATIENT PHYSICAL THERAPY LOWER EXTREMITY TREATMENT   Patient Name: [name] [name] MRN: [id number] DOB:Mar 06, 1971, 53 y.o., male Today's Date: 03/06/2024  END OF SESSION:  PT End of Session - 03/06/24 1206     Visit Number 7    Number of Visits 16    Date for PT Re-Evaluation 03/31/24    Authorization Type [employer] dual complete no authorization needed  20% coinsurance    Progress Note Due on Visit 10    PT Start Time 1107    PT Stop Time 1153    PT Time Calculation (min) 46 min    Activity Tolerance Patient tolerated treatment well;No increased pain;Patient limited by pain;Patient limited by fatigue    Behavior During Therapy [facility] for tasks assessed/performed              Past Medical History:  Diagnosis Date   Abdominal pain 11/30/2023   Abdominal tenderness 10/14/2023   Allergy    seasonal and environmental   Anemia    Chest pain 12/01/2023   Chronic constipation 05/13/2020   Depression    Diabetes mellitus without complication (HCC) 2019   Dyspnea    hx - SOB with exertion   ESRD on hemodialysis (HCC)    Tues, Thurs, Sat   GERD (gastroesophageal reflux disease)    Hyperlipidemia    Hypertension    Necrotizing fasciitis (HCC) 10/13/2017   Neuromuscular disorder (HCC)    neuropathy   PAD (peripheral artery disease) (HCC)    Secondary hyperparathyroidism (HCC)    SVT (supraventricular tachycardia) (HCC)    hx   Wound dehiscence 11/24/2017   Past Surgical History:  Procedure Laterality Date   AMPUTATION Right 10/13/2017   Procedure: RIGHT BELOW KNEE AMPUTATION;  Surgeon: Nadara Mustard, MD;  Location: [facility] OR;  Service: Orthopedics;  Laterality: Right;   AMPUTATION Right 11/24/2017   Procedure: AMPUTATION BELOW KNEE REVISION;  Surgeon: Nadara Mustard, MD;  Location: [facility] OR;  Service: Orthopedics;  Laterality: Right;   AMPUTATION TOE Left    AV FISTULA PLACEMENT Left 10/19/2023   Procedure: LEFT BRACHIOCEPHALIC ARTERIOVENOUS (AV) FISTULA CREATION;  Surgeon:  Daria Pastures, MD;  Location: [facility] OR;  Service: Vascular;  Laterality: Left;   AV FISTULA PLACEMENT Left 01/26/2024   Procedure: LEFT ARTERIOVENOUS (AV) FISTULA CREATION;  Surgeon: Daria Pastures, MD;  Location: [facility] OR;  Service: Vascular;  Laterality: Left;   BIOPSY  09/02/2021   Procedure: BIOPSY;  Surgeon: Imogene Burn, MD;  Location: [facility] ENDOSCOPY;  Service: Gastroenterology;;   BIOPSY  02/18/2023   Procedure: BIOPSY;  Surgeon: Benancio Deeds, MD;  Location: [facility] ENDOSCOPY;  Service: Gastroenterology;;   COLONOSCOPY  05/11/2019   COLONOSCOPY  2021   3 polyps - tubular adenomas   ESOPHAGOGASTRODUODENOSCOPY (EGD) WITH PROPOFOL N/A 09/02/2021   Procedure: ESOPHAGOGASTRODUODENOSCOPY (EGD) WITH PROPOFOL;  Surgeon: Imogene Burn, MD;  Location: [facility] ENDOSCOPY;  Service: Gastroenterology;  Laterality: N/A;   ESOPHAGOGASTRODUODENOSCOPY (EGD) WITH PROPOFOL N/A 02/18/2023   Procedure: ESOPHAGOGASTRODUODENOSCOPY (EGD) WITH PROPOFOL;  Surgeon: Benancio Deeds, MD;  Location: [facility] ENDOSCOPY;  Service: Gastroenterology;  Laterality: N/A;   INTRAMEDULLARY (IM) NAIL INTERTROCHANTERIC Right 10/15/2023   Procedure: INTRAMEDULLARY (IM) NAIL INTERTROCHANTERIC;  Surgeon: Roby Lofts, MD;  Location: [facility] OR;  Service: Orthopedics;  Laterality: Right;   IR FLUORO GUIDE CV LINE RIGHT  10/15/2023   IR US GUIDE VASC ACCESS RIGHT  10/15/2023   POLYPECTOMY     WISDOM TOOTH EXTRACTION     Patient Active  Problem List   Diagnosis Date Noted   Nausea and vomiting 01/26/2024   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/25/2024   Depression 01/24/2024   Hypertensive urgency 01/24/2024   Hypotension 12/01/2023   Altered mental status 11/30/2023   ESRD (end stage renal disease) on dialysis (HCC) 11/30/2023   Right knee pain 10/24/2023   Nonimmune to hepatitis B virus 10/22/2023   Closed fracture of right hip (HCC) 10/15/2023   T2DM (type 2 diabetes mellitus) (HCC) 10/14/2023   Urinary retention 10/14/2023   Closed  intertrochanteric fracture of right hip (HCC) 10/13/2023   Patellar tendinitis 10/12/2023   Wrist sprain, left, initial encounter 10/12/2023   Contracture, 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   Mucosal abnormality of esophagus 02/18/2023   Foot pain, left 02/17/2023   SOB (shortness of breath) 02/16/2023   History of esophagitis 02/16/2023   Tachycardia 02/15/2023   Allergic rhinitis 11/07/2022   Chronic kidney disease, stage 5 (HCC) 09/11/2022   Tobacco dependence 09/11/2022   GERD with esophagitis 09/02/2021   Acute kidney injury superimposed on CKD (HCC) 04/10/2021   Achilles tendon contracture, left 03/03/2018   Non-pressure chronic ulcer of other part of left foot limited to breakdown of skin (HCC) 02/02/2018   History of right below knee amputation (HCC) 12/22/2017   Acute posthemorrhagic anemia    Falls 11/24/2017   Severe hypertension 11/24/2017   Anemia 11/24/2017   Uncontrolled type 2 diabetes mellitus with hyperglycemia, with long-term current use of insulin (HCC) 10/11/2017   ARF (acute renal failure) (HCC) 10/11/2017    PCP: Genora Kidd, MD  REFERRING PROVIDER: Timothy Ford, MD  REFERRING DIAG: [phone] (ICD-10-CM) - Chronic pain of right knee  THERAPY DIAG:  Difficulty in walking, not elsewhere classified  Unsteadiness on feet  Repeated falls  Chronic pain of right knee  Muscle weakness (generalized)  Rationale for Evaluation and Treatment: Rehabilitation  ONSET DATE: 09/2023   SUBJECTIVE:   SUBJECTIVE STATEMENT: [name] [name] reports good compliance with his 100 quadriceps sets per day recommendation from evaluation.  Knee pain still limits function.     PERTINENT HISTORY: Type 2 diabetes, HLD, HTN, peripheral artery disease, Rt below knee amputation, left toe amputation, end stage renal disease, right hip fracture in 09/2023  PAIN:  Are you having pain? Yes: NPRS scale: 8-10/10 at worst over the past week Pain location: Super-medial knee and above the knee  cap Pain description: Sharp, worsens with increased weight-bearing Aggravating factors: Standing and walking, stand to sit transition, knee flexion and hip flexion Relieving factors: Getting off the knee  PRECAUTIONS:  NO BP LUE, fall risk  RED FLAGS: None   WEIGHT BEARING RESTRICTIONS:  June notes increased WB increases symptoms  FALLS:  Has patient fallen in last 6 months?  Yes, cause of initial injury but no falls since that time.  LIVING ENVIRONMENT: Lives with: lives with their family Lives in: Mobile home Stairs:  Needs handrails Has following equipment at home: Single point cane, Environmental consultant - 4 wheeled, Wheelchair (manual), shower chair, and shower bars  OCCUPATION: Disability, drive, racing  PLOF:  Had 3 falls previous before the fall that resulted in a fracture   PATIENT GOALS: Be able to walk safely without the walker and with less knee pain  NEXT MD VISIT: 04/03/2024  OBJECTIVE:  Note: Objective measures were completed at Evaluation unless otherwise noted.  DIAGNOSTIC FINDINGS: 1. No meniscal or ligamentous injury of the right knee. 2. Mild osseous contusion of the posteromedial tibial plateau. 3. Severe edema in infrapatellar Hoffa's fat  likely reflecting a contusion secondary to direct trauma.  PATIENT SURVEYS:  Patient-Specific Activity Scoring Scheme  "0" represents "unable to perform." "10" represents "able to perform at prior level. 0 1 2 3 4 5 6 7 8 9  10 (Date and Score)   Activity 02/16/2024  03/06/2024  1. Walking with cane in community  0  0  2. Walking in house without device  0  0  3. Picking up items from floor 0 0  4.    5.    Score 0 0   Total score = sum of the activity scores/number of activities Minimum detectable change (90%CI) for average score = 2 points Minimum detectable change (90%CI) for single activity score = 3 points    COGNITION: Overall cognitive status: Within functional limits for tasks assessed     SENSATION: Diabetic  neuropathy of the left foot and some tingling above the right knee  EDEMA:  Carlosdaniel notes swelling his left side but he gets off the right side before swelling is an issue  LOWER EXTREMITY ROM:  Active ROM Left/Right 02/04/2024   Hip flexion    Hip extension    Hip abduction    Hip adduction    Hip internal rotation    Hip external rotation    Knee flexion 140/118   Knee extension 0/0   Ankle dorsiflexion    Ankle plantarflexion    Ankle inversion    Ankle eversion     (Blank rows = not tested)  LOWER EXTREMITY STRENGTH:  Strength was assessed using circumferential thigh girth 15 cm proximal to the superior patellar pole Left/Right 02/04/2024 02/28/2024 Left/Right  Hip flexion    Hip extension    Hip abduction    Hip adduction    Hip internal rotation    Hip external rotation    Knee flexion    Knee extension 48.5 cm/47.0 cm 10-15% strength deficit per centimeter of atrophy 48.5 cm/47.0 cm 10-15% strength deficit per centimeter of atrophy  Ankle dorsiflexion    Ankle plantarflexion    Ankle inversion    Ankle eversion     (Blank rows = not tested)  GAIT: Evaluation on 02/04/2024: Distance walked: 50 feet Assistive device utilized: Environmental consultant - 4 wheeled Level of assistance: Complete Independence Comments: Doyel notes he has increased knee pain immediately upon standing.  He is very limited in his standing and walking endurance as a result.  Prosthetic Wear & Care: 02/16/2024:  He is wearing prosthesis from arising to bedtime.   No skin issues noted.                                                                                                                             TREATMENT DATE:  03/06/2024 Quadriceps sets supine with pillow below knees, no prosthesis 10 x 5 seconds Seated straight leg raises 5 sets of 5 bilateral (PT with slight assist with a strap around the distal prosthesis for the first  3 sets, no prosthesis for the final 2 sets) Discussed changes to  long-term HEP and scheduling for another 4 weeks of supervised PT  Neuromuscular re-education: Wide tandem balance 5 x 20 seconds bilateral (encourage slight knee flexion)   03/01/2024  Therapeutic Exercise: Nustep seat 15 Level 7 with BLEs & BUEs for 4  min, then level 6 BLEs only 4 min Seated LAQ 10 reps 2 sets ea LE.  RLE full range without assist no weight and LLE 2#. Seated hamstring curl RLE red Theraband with assist (only last 25% today, was last 50%) concentric for full range & controlled eccentric 10 reps 1 sets; LLE green theraband 10 reps 1 sets.   Therapeutic Activities: Leg Press BLEs 56# 10 reps 2 sets;  single leg 25# LLE & 12# RLE (unable to move with 25# or 18#) 10 reps ea LE Standing with locked rollator walker support: hip flexion SLR & hip ext with RLE red Tband & LLE green Tband 10 reps 2 sets ea LE. Standing with locked rollator walker support:  abduction red band around knees 10 reps ea LE. Alternating LEs.   02/28/2024  Therapeutic Exercise: Nustep seat 15 Level 7 with BLEs & BUEs for 4  min, then level 6 BLEs only 4 min Seated LAQ 10 reps 2 sets ea LE.  RLE full range without assist no weight and LLE 2#. Seated hamstring curl RLE red Theraband with assist (only last 25% today, was last 50%) concentric for full range & controlled eccentric 10 reps 1 sets; LLE green theraband 10 reps 1 sets.   Therapeutic Activities Standing with locked rollator walker support: hip flexion SLR & hip ext with RLE red Tband & LLE green Tband 15 reps ea alternating LEs. Standing with locked rollator walker support:  abduction red band around knees 10 reps ea LE. Alternating LEs. Leg Press BLEs 56# 10 reps 2 sets;  single leg 25# LLE & 12# RLE (unable to move with 25# or 18#) 10 reps ea LE   PATIENT EDUCATION:  Education details: See above Person educated: Patient Education method: Explanation, Demonstration, Tactile cues, Verbal cues, and Handouts Education comprehension: verbalized  understanding, returned demonstration, verbal cues required, tactile cues required, and needs further education  HOME EXERCISE PROGRAM: Access Code: [email or web address] URL: [email or web address] Date: 02/23/2024 Prepared by: Lorie Rook  Exercises - Supine Quadricep Sets  - 5 x daily - 7 x weekly - 2 sets - 10 reps - 5 second hold - Seated Long Arc Quad  - 1 x daily - 4 x weekly - 2 sets - 10 reps - 5 seconds hold - Standing Hip Abduction Alternating legs  - 1 x daily - 4 x weekly - 2 sets - 10 reps - 5 seconds hold - Alternating Hip Flexion Forward Kicks  - 1 x daily - 4 x weekly - 2 sets - 10 reps - 5 seconds hold - Standing Hip Extension with Counter Support  - 1 x daily - 4 x weekly - 2 sets - 10 reps - 5 seconds hold  ASSESSMENT:  CLINICAL IMPRESSION: Vaishnav reports that right knee pain limits his daily weight-bearing and functional activities.  He was able to complete a few reps of seated straight leg raises without physical therapist assistance today (unable at evaluation), demonstrating improved quadriceps strength.  Jansel has not noticed any symptomatic progress yet and will benefit from the full 8 weeks of recommended physical therapy.  He follows up with Dr. Julio Ohm on May 12 and I  encouraged Silus to continue his home exercises and supervised physical therapy up until his follow-up appointment.   Evaluation on 02/04/2024:  Patient is a 53 y.o. male who was seen today for physical therapy evaluation and treatment for M25.561,G89.29 (ICD-10-CM) - Chronic pain of right knee.  Dwain had a fall in November 2024 that resulted in a hip fracture.  His knee pain has been present since that time.  It is very likely that Burlin's right knee pain is related to the decrease in activity, strength and atrophy related to this fall and fracture.  Manual palpation of quadriceps sets shows significantly decreased right quadriceps activation as compared to the left and significant atrophy is noted of  the right thigh.  Waino was started on an appropriate home exercise program to improve quadricep strength, knee pain and allow him to improve his standing and walking endurance.  Given the 59-month history of this problem, and because of his other medical issues including severe diabetes and a below-knee amputation, Wille Celeste may require a longer than normal course of rehabilitation.  OBJECTIVE IMPAIRMENTS: Abnormal gait, cardiopulmonary status limiting activity, decreased activity tolerance, decreased balance, decreased endurance, decreased knowledge of condition, difficulty walking, decreased ROM, decreased strength, increased edema, impaired perceived functional ability, and pain.   ACTIVITY LIMITATIONS: carrying, bending, standing, squatting, stairs, transfers, and locomotion level  PARTICIPATION LIMITATIONS: community activity  PERSONAL FACTORS: Type 2 diabetes, HLD, HTN, peripheral artery disease, Rt below knee amputation, left toe amputation, end stage renal disease, right hip fracture in 09/2023 are also affecting patient's functional outcome.   REHAB POTENTIAL: Good  CLINICAL DECISION MAKING: Stable/uncomplicated  EVALUATION COMPLEXITY: Moderate   GOALS: Goals reviewed with patient? Yes  SHORT TERM GOALS: Target date: 03/03/2024 Zuhair will be independent with his day 1 home exercise program Baseline: Started 02/04/2024 Goal status: MET  02/28/2024  2.  Improve right quadriceps strength as assessed by improved quadriceps activation as assessed manually by the physical therapist during a quadriceps set exercise Baseline: Poor activation at evaluation Goal status: MET 02/28/2024   LONG TERM GOALS: Target date: 03/31/2024  Improve patient specific functional scale to at least 50% for walking in home & community and picking up items Baseline: 0% Goal status: Ongoing  03/06/2024  2.  Isreal will report right knee pain no greater than 3 out of 10 on the visual analog scale Baseline: As high  as 10 out of 10 Goal status: Ongoing  03/06/2024  3.  Chapin will be able to complete a set of 10 consecutive seated straight leg raises, demonstrating improved quadriceps strength Baseline: Unable at evaluation Goal status: Ongoing  03/06/2024  4.  Raj will report improved standing and walking endurance as compared to evaluation Baseline: Significantly increased right knee pain immediately upon standing which severely limits standing and walking endurance Goal status: Ongoing  03/06/2024  5.  Gamble will be independent with his long-term maintenance home exercise program Baseline: Started 02/04/2024 Goal status: Ongoing 03/06/2024  PLAN:  PT FREQUENCY: 1-2x/week  PT DURATION: 4 additional weeks  PLANNED INTERVENTIONS: 97110-Therapeutic exercises, 97530- Therapeutic activity, 97112- Neuromuscular re-education, 97535- Self Care, 16109- Manual therapy, [phone]- Gait training, [phone]- Prosthetic training, [phone]- Vasopneumatic device, Patient/Family education, Balance training, Stair training, and Cryotherapy  PLAN FOR NEXT SESSION:  Quadriceps strengthening, balance, appropriate functional progressions keeping in mind his difficulty with WB function   Cherlyn Cushing, PT, MPT 03/06/2024, 12:18 PM

## 2024-03-06 NOTE — Telephone Encounter (Signed)
 Patient called to inform this nurse that Friday, 4/25 at 530a is suitable for him.  He is unable to see Dr. Susi Eric on Mondays due to prior agreements.  He requested Friday instead. Placed patient on Dr. Roland Cleveland schedule to suit patient request/need.

## 2024-03-07 DIAGNOSIS — I132 Hypertensive heart and chronic kidney disease with heart failure and with stage 5 chronic kidney disease, or end stage renal disease: Secondary | ICD-10-CM | POA: Diagnosis not present

## 2024-03-07 DIAGNOSIS — D689 Coagulation defect, unspecified: Secondary | ICD-10-CM | POA: Diagnosis not present

## 2024-03-07 DIAGNOSIS — N2581 Secondary hyperparathyroidism of renal origin: Secondary | ICD-10-CM | POA: Diagnosis not present

## 2024-03-07 DIAGNOSIS — T8249XA Other complication of vascular dialysis catheter, initial encounter: Secondary | ICD-10-CM | POA: Diagnosis not present

## 2024-03-07 DIAGNOSIS — D631 Anemia in chronic kidney disease: Secondary | ICD-10-CM | POA: Diagnosis not present

## 2024-03-07 DIAGNOSIS — Z992 Dependence on renal dialysis: Secondary | ICD-10-CM | POA: Diagnosis not present

## 2024-03-07 DIAGNOSIS — N186 End stage renal disease: Secondary | ICD-10-CM | POA: Diagnosis not present

## 2024-03-07 DIAGNOSIS — E1122 Type 2 diabetes mellitus with diabetic chronic kidney disease: Secondary | ICD-10-CM | POA: Diagnosis not present

## 2024-03-08 ENCOUNTER — Encounter: Payer: Self-pay | Admitting: Rehabilitative and Restorative Service Providers"

## 2024-03-08 ENCOUNTER — Ambulatory Visit: Admitting: Rehabilitative and Restorative Service Providers"

## 2024-03-08 DIAGNOSIS — G8929 Other chronic pain: Secondary | ICD-10-CM

## 2024-03-08 DIAGNOSIS — R2681 Unsteadiness on feet: Secondary | ICD-10-CM

## 2024-03-08 DIAGNOSIS — R296 Repeated falls: Secondary | ICD-10-CM

## 2024-03-08 DIAGNOSIS — R262 Difficulty in walking, not elsewhere classified: Secondary | ICD-10-CM | POA: Diagnosis not present

## 2024-03-08 DIAGNOSIS — M6281 Muscle weakness (generalized): Secondary | ICD-10-CM | POA: Diagnosis not present

## 2024-03-08 DIAGNOSIS — M25561 Pain in right knee: Secondary | ICD-10-CM | POA: Diagnosis not present

## 2024-03-08 NOTE — Therapy (Signed)
 OUTPATIENT PHYSICAL THERAPY LOWER EXTREMITY TREATMENT   Patient Name: [name] [name] RECORD MRN: [id number] DOB:20-Apr-1971, 53 y.o., male Today's Date: 03/08/2024  END OF SESSION:  PT End of Session - 03/08/24 1103     Visit Number 8    Number of Visits 16    Date for PT Re-Evaluation 03/31/24    Authorization Type [employer] dual complete no authorization needed  20% coinsurance    Progress Note Due on Visit 10    PT Start Time 1101    PT Stop Time 1146    PT Time Calculation (min) 45 min    Activity Tolerance Patient tolerated treatment well;No increased pain;Patient limited by pain;Patient limited by fatigue    Behavior During Therapy [facility] for tasks assessed/performed               Past Medical History:  Diagnosis Date   Abdominal pain 11/30/2023   Abdominal tenderness 10/14/2023   Allergy    seasonal and environmental   Anemia    Chest pain 12/01/2023   Chronic constipation 05/13/2020   Depression    Diabetes mellitus without complication (HCC) 2019   Dyspnea    hx - SOB with exertion   ESRD on hemodialysis (HCC)    Tues, Thurs, Sat   GERD (gastroesophageal reflux disease)    Hyperlipidemia    Hypertension    Necrotizing fasciitis (HCC) 10/13/2017   Neuromuscular disorder (HCC)    neuropathy   PAD (peripheral artery disease) (HCC)    Secondary hyperparathyroidism (HCC)    SVT (supraventricular tachycardia) (HCC)    hx   Wound dehiscence 11/24/2017   Past Surgical History:  Procedure Laterality Date   AMPUTATION Right 10/13/2017   Procedure: RIGHT BELOW KNEE AMPUTATION;  Surgeon: Nadara Mustard, MD;  Location: [facility] OR;  Service: Orthopedics;  Laterality: Right;   AMPUTATION Right 11/24/2017   Procedure: AMPUTATION BELOW KNEE REVISION;  Surgeon: Nadara Mustard, MD;  Location: [facility] OR;  Service: Orthopedics;  Laterality: Right;   AMPUTATION TOE Left    AV FISTULA PLACEMENT Left 10/19/2023   Procedure: LEFT BRACHIOCEPHALIC ARTERIOVENOUS (AV) FISTULA CREATION;   Surgeon: Daria Pastures, MD;  Location: [facility] OR;  Service: Vascular;  Laterality: Left;   AV FISTULA PLACEMENT Left 01/26/2024   Procedure: LEFT ARTERIOVENOUS (AV) FISTULA CREATION;  Surgeon: Daria Pastures, MD;  Location: [facility] OR;  Service: Vascular;  Laterality: Left;   BIOPSY  09/02/2021   Procedure: BIOPSY;  Surgeon: Imogene Burn, MD;  Location: [facility] ENDOSCOPY;  Service: Gastroenterology;;   BIOPSY  02/18/2023   Procedure: BIOPSY;  Surgeon: Benancio Deeds, MD;  Location: [facility] ENDOSCOPY;  Service: Gastroenterology;;   COLONOSCOPY  05/11/2019   COLONOSCOPY  2021   3 polyps - tubular adenomas   ESOPHAGOGASTRODUODENOSCOPY (EGD) WITH PROPOFOL N/A 09/02/2021   Procedure: ESOPHAGOGASTRODUODENOSCOPY (EGD) WITH PROPOFOL;  Surgeon: Imogene Burn, MD;  Location: [facility] ENDOSCOPY;  Service: Gastroenterology;  Laterality: N/A;   ESOPHAGOGASTRODUODENOSCOPY (EGD) WITH PROPOFOL N/A 02/18/2023   Procedure: ESOPHAGOGASTRODUODENOSCOPY (EGD) WITH PROPOFOL;  Surgeon: Benancio Deeds, MD;  Location: [facility] ENDOSCOPY;  Service: Gastroenterology;  Laterality: N/A;   INTRAMEDULLARY (IM) NAIL INTERTROCHANTERIC Right 10/15/2023   Procedure: INTRAMEDULLARY (IM) NAIL INTERTROCHANTERIC;  Surgeon: Roby Lofts, MD;  Location: [facility] OR;  Service: Orthopedics;  Laterality: Right;   IR FLUORO GUIDE CV LINE RIGHT  10/15/2023   IR US GUIDE VASC ACCESS RIGHT  10/15/2023   POLYPECTOMY     WISDOM TOOTH EXTRACTION     Patient  Active Problem List   Diagnosis Date Noted   Nausea and vomiting 01/26/2024   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/25/2024   Depression 01/24/2024   Hypertensive urgency 01/24/2024   Hypotension 12/01/2023   Altered mental status 11/30/2023   ESRD (end stage renal disease) on dialysis (HCC) 11/30/2023   Right knee pain 10/24/2023   Nonimmune to hepatitis B virus 10/22/2023   Closed fracture of right hip (HCC) 10/15/2023   T2DM (type 2 diabetes mellitus) (HCC) 10/14/2023   Urinary retention 10/14/2023   Closed  intertrochanteric fracture of right hip (HCC) 10/13/2023   Patellar tendinitis 10/12/2023   Wrist sprain, left, initial encounter 10/12/2023   Contracture, 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   Mucosal abnormality of esophagus 02/18/2023   Foot pain, left 02/17/2023   SOB (shortness of breath) 02/16/2023   History of esophagitis 02/16/2023   Tachycardia 02/15/2023   Allergic rhinitis 11/07/2022   Chronic kidney disease, stage 5 (HCC) 09/11/2022   Tobacco dependence 09/11/2022   GERD with esophagitis 09/02/2021   Acute kidney injury superimposed on CKD (HCC) 04/10/2021   Achilles tendon contracture, left 03/03/2018   Non-pressure chronic ulcer of other part of left foot limited to breakdown of skin (HCC) 02/02/2018   History of right below knee amputation (HCC) 12/22/2017   Acute posthemorrhagic anemia    Falls 11/24/2017   Severe hypertension 11/24/2017   Anemia 11/24/2017   Uncontrolled type 2 diabetes mellitus with hyperglycemia, with long-term current use of insulin (HCC) 10/11/2017   ARF (acute renal failure) (HCC) 10/11/2017    PCP: Genora Kidd, MD  REFERRING PROVIDER: Timothy Ford, MD  REFERRING DIAG: [phone] (ICD-10-CM) - Chronic pain of right knee  THERAPY DIAG:  Difficulty in walking, not elsewhere classified  Unsteadiness on feet  Repeated falls  Chronic pain of right knee  Muscle weakness (generalized)  Rationale for Evaluation and Treatment: Rehabilitation  ONSET DATE: 09/2023   SUBJECTIVE:   SUBJECTIVE STATEMENT: [name] reports good compliance with his 100 quadriceps sets per day.  He has some questions with seated straight leg raises.   PERTINENT HISTORY: Type 2 diabetes, HLD, HTN, peripheral artery disease, Rt below knee amputation, left toe amputation, end stage renal disease, right hip fracture in 09/2023  PAIN:  Are you having pain? Yes: NPRS scale: 8-10/10 at worst over the past week Pain location: Super-medial knee and above the knee cap Pain  description: Sharp, worsens with increased weight-bearing Aggravating factors: Standing and walking, stand to sit transition, knee flexion and hip flexion Relieving factors: Getting off the knee  PRECAUTIONS:  NO BP LUE, fall risk  RED FLAGS: None   WEIGHT BEARING RESTRICTIONS:  Mylo notes increased WB increases symptoms  FALLS:  Has patient fallen in last 6 months?  Yes, cause of initial injury but no falls since that time.  LIVING ENVIRONMENT: Lives with: lives with their family Lives in: Mobile home Stairs:  Needs handrails Has following equipment at home: Single point cane, Environmental consultant - 4 wheeled, Wheelchair (manual), shower chair, and shower bars  OCCUPATION: Disability, drive, racing  PLOF:  Had 3 falls previous before the fall that resulted in a fracture   PATIENT GOALS: Be able to walk safely without the walker and with less knee pain  NEXT MD VISIT: 04/03/2024  OBJECTIVE:  Note: Objective measures were completed at Evaluation unless otherwise noted.  DIAGNOSTIC FINDINGS: 1. No meniscal or ligamentous injury of the right knee. 2. Mild osseous contusion of the posteromedial tibial plateau. 3. Severe edema in infrapatellar Hoffa's fat  likely reflecting a contusion secondary to direct trauma.  PATIENT SURVEYS:  Patient-Specific Activity Scoring Scheme  "0" represents "unable to perform." "10" represents "able to perform at prior level. 0 1 2 3 4 5 6 7 8 9  10 (Date and Score)   Activity 02/16/2024  03/06/2024  1. Walking with cane in community  0  0  2. Walking in house without device  0  0  3. Picking up items from floor 0 0  4.    5.    Score 0 0   Total score = sum of the activity scores/number of activities Minimum detectable change (90%CI) for average score = 2 points Minimum detectable change (90%CI) for single activity score = 3 points    COGNITION: Overall cognitive status: Within functional limits for tasks assessed     SENSATION: Diabetic neuropathy  of the left foot and some tingling above the right knee  EDEMA:  Ranier notes swelling his left side but he gets off the right side before swelling is an issue  LOWER EXTREMITY ROM:  Active ROM Left/Right 02/04/2024   Hip flexion    Hip extension    Hip abduction    Hip adduction    Hip internal rotation    Hip external rotation    Knee flexion 140/118   Knee extension 0/0   Ankle dorsiflexion    Ankle plantarflexion    Ankle inversion    Ankle eversion     (Blank rows = not tested)  LOWER EXTREMITY STRENGTH:  Strength was assessed using circumferential thigh girth 15 cm proximal to the superior patellar pole Left/Right 02/04/2024 02/28/2024 Left/Right  Hip flexion    Hip extension    Hip abduction    Hip adduction    Hip internal rotation    Hip external rotation    Knee flexion    Knee extension 48.5 cm/47.0 cm 10-15% strength deficit per centimeter of atrophy 48.5 cm/47.0 cm 10-15% strength deficit per centimeter of atrophy  Ankle dorsiflexion    Ankle plantarflexion    Ankle inversion    Ankle eversion     (Blank rows = not tested)  GAIT: Evaluation on 02/04/2024: Distance walked: 50 feet Assistive device utilized: Environmental consultant - 4 wheeled Level of assistance: Complete Independence Comments: Nathanael notes he has increased knee pain immediately upon standing.  He is very limited in his standing and walking endurance as a result.  Prosthetic Wear & Care: 02/16/2024:  He is wearing prosthesis from arising to bedtime.   No skin issues noted.                                                                                                                             TREATMENT DATE:  03/08/2024 Quadriceps sets supine with pillow below knees, pushed a bit higher up under the distal thigh (ask Derris if it is comfortable), no prosthesis 2 sets 10 x 5 seconds Seated straight leg raises 3 sets  of 5 bilateral (no prosthesis) Seated knee extension isometrics with PT seated at the edge  of the treatment table 2 sets of 10 for 5 seconds Discussed how this could be done with Thera-Band at home, will do again on Terran's next visit  Neuromuscular re-education: Wide tandem balance 3 x 20 seconds bilateral (encourage slight knee flexion).  Needed breaks due to fatigue.  Functional Activities: Step-down from 4 to 2 inch step with hands and slow eccentrics 10 x   03/06/2024 Quadriceps sets supine with pillow below knees, no prosthesis 10 x 5 seconds Seated straight leg raises 5 sets of 5 bilateral (PT with slight assist with a strap around the distal prosthesis for the first 3 sets, no prosthesis for the final 2 sets) Discussed changes to long-term HEP and scheduling for another 4 weeks of supervised PT  Neuromuscular re-education: Wide tandem balance 5 x 20 seconds bilateral (encourage slight knee flexion).    03/01/2024  Therapeutic Exercise: Nustep seat 15 Level 7 with BLEs & BUEs for 4  min, then level 6 BLEs only 4 min Seated LAQ 10 reps 2 sets ea LE.  RLE full range without assist no weight and LLE 2#. Seated hamstring curl RLE red Theraband with assist (only last 25% today, was last 50%) concentric for full range & controlled eccentric 10 reps 1 sets; LLE green theraband 10 reps 1 sets.   Therapeutic Activities: Leg Press BLEs 56# 10 reps 2 sets;  single leg 25# LLE & 12# RLE (unable to move with 25# or 18#) 10 reps ea LE Standing with locked rollator walker support: hip flexion SLR & hip ext with RLE red Tband & LLE green Tband 10 reps 2 sets ea LE. Standing with locked rollator walker support:  abduction red band around knees 10 reps ea LE. Alternating LEs.   PATIENT EDUCATION:  Education details: See above Person educated: Patient Education method: Explanation, Demonstration, Tactile cues, Verbal cues, and Handouts Education comprehension: verbalized understanding, returned demonstration, verbal cues required, tactile cues required, and needs further  education  HOME EXERCISE PROGRAM: Access Code: [email or web address] URL: [email or web address] Date: 02/23/2024 Prepared by: Lorie Rook  Exercises - Supine Quadricep Sets  - 5 x daily - 7 x weekly - 2 sets - 10 reps - 5 second hold - Seated Long Arc Quad  - 1 x daily - 4 x weekly - 2 sets - 10 reps - 5 seconds hold - Standing Hip Abduction Alternating legs  - 1 x daily - 4 x weekly - 2 sets - 10 reps - 5 seconds hold - Alternating Hip Flexion Forward Kicks  - 1 x daily - 4 x weekly - 2 sets - 10 reps - 5 seconds hold - Standing Hip Extension with Counter Support  - 1 x daily - 4 x weekly - 2 sets - 10 reps - 5 seconds hold  ASSESSMENT:  CLINICAL IMPRESSION: Davyon's right knee pain limits his daily weight-bearing and functional activities.  We were able to correct seated straight leg raises and this should help progress Elohim's quadriceps strength.  Continue current POC focused on quadriceps strength, weight-bearing function and endurance.   Evaluation on 02/04/2024:  Patient is a 53 y.o. male who was seen today for physical therapy evaluation and treatment for M25.561,G89.29 (ICD-10-CM) - Chronic pain of right knee.  Ashford had a fall in November 2024 that resulted in a hip fracture.  His knee pain has been present since that time.  It is very likely that [employer]  right knee pain is related to the decrease in activity, strength and atrophy related to this fall and fracture.  Manual palpation of quadriceps sets shows significantly decreased right quadriceps activation as compared to the left and significant atrophy is noted of the right thigh.  Pranay was started on an appropriate home exercise program to improve quadricep strength, knee pain and allow him to improve his standing and walking endurance.  Given the 33-month history of this problem, and because of his other medical issues including severe diabetes and a below-knee amputation, Janie may require a longer than normal course of  rehabilitation.  OBJECTIVE IMPAIRMENTS: Abnormal gait, cardiopulmonary status limiting activity, decreased activity tolerance, decreased balance, decreased endurance, decreased knowledge of condition, difficulty walking, decreased ROM, decreased strength, increased edema, impaired perceived functional ability, and pain.   ACTIVITY LIMITATIONS: carrying, bending, standing, squatting, stairs, transfers, and locomotion level  PARTICIPATION LIMITATIONS: community activity  PERSONAL FACTORS: Type 2 diabetes, HLD, HTN, peripheral artery disease, Rt below knee amputation, left toe amputation, end stage renal disease, right hip fracture in 09/2023 are also affecting patient's functional outcome.   REHAB POTENTIAL: Good  CLINICAL DECISION MAKING: Stable/uncomplicated  EVALUATION COMPLEXITY: Moderate   GOALS: Goals reviewed with patient? Yes  SHORT TERM GOALS: Target date: 03/03/2024 Franke will be independent with his day 1 home exercise program Baseline: Started 02/04/2024 Goal status: MET  02/28/2024  2.  Improve right quadriceps strength as assessed by improved quadriceps activation as assessed manually by the physical therapist during a quadriceps set exercise Baseline: Poor activation at evaluation Goal status: MET 02/28/2024   LONG TERM GOALS: Target date: 03/31/2024  Improve patient specific functional scale to at least 50% for walking in home & community and picking up items Baseline: 0% Goal status: Ongoing  03/06/2024  2.  Finneas will report right knee pain no greater than 3 out of 10 on the visual analog scale Baseline: As high as 10 out of 10 Goal status: Ongoing  03/06/2024  3.  Riyad will be able to complete a set of 10 consecutive seated straight leg raises, demonstrating improved quadriceps strength Baseline: Unable at evaluation Goal status: Ongoing  03/06/2024  4.  Marcellus will report improved standing and walking endurance as compared to evaluation Baseline: Significantly  increased right knee pain immediately upon standing which severely limits standing and walking endurance Goal status: Ongoing  03/06/2024  5.  Trindon will be independent with his long-term maintenance home exercise program Baseline: Started 02/04/2024 Goal status: Ongoing 03/06/2024  PLAN:  PT FREQUENCY: 1-2x/week  PT DURATION: 4 additional weeks  PLANNED INTERVENTIONS: 97110-Therapeutic exercises, 97530- Therapeutic activity, 97112- Neuromuscular re-education, 97535- Self Care, [id number]- Manual therapy, [phone]- Gait training, [phone]- Prosthetic training, [phone]- Vasopneumatic device, Patient/Family education, Balance training, Stair training, and Cryotherapy  PLAN FOR NEXT SESSION:  Quadriceps strengthening, balance, appropriate functional progressions keeping in mind his difficulty with WB function   Joli Neas, PT, MPT 03/08/2024, 12:49 PM

## 2024-03-09 DIAGNOSIS — D689 Coagulation defect, unspecified: Secondary | ICD-10-CM | POA: Diagnosis not present

## 2024-03-09 DIAGNOSIS — I132 Hypertensive heart and chronic kidney disease with heart failure and with stage 5 chronic kidney disease, or end stage renal disease: Secondary | ICD-10-CM | POA: Diagnosis not present

## 2024-03-09 DIAGNOSIS — E1122 Type 2 diabetes mellitus with diabetic chronic kidney disease: Secondary | ICD-10-CM | POA: Diagnosis not present

## 2024-03-09 DIAGNOSIS — T8249XA Other complication of vascular dialysis catheter, initial encounter: Secondary | ICD-10-CM | POA: Diagnosis not present

## 2024-03-09 DIAGNOSIS — N186 End stage renal disease: Secondary | ICD-10-CM | POA: Diagnosis not present

## 2024-03-09 DIAGNOSIS — Z992 Dependence on renal dialysis: Secondary | ICD-10-CM | POA: Diagnosis not present

## 2024-03-09 DIAGNOSIS — D631 Anemia in chronic kidney disease: Secondary | ICD-10-CM | POA: Diagnosis not present

## 2024-03-09 DIAGNOSIS — N2581 Secondary hyperparathyroidism of renal origin: Secondary | ICD-10-CM | POA: Diagnosis not present

## 2024-03-11 DIAGNOSIS — I132 Hypertensive heart and chronic kidney disease with heart failure and with stage 5 chronic kidney disease, or end stage renal disease: Secondary | ICD-10-CM | POA: Diagnosis not present

## 2024-03-11 DIAGNOSIS — E118 Type 2 diabetes mellitus with unspecified complications: Secondary | ICD-10-CM | POA: Diagnosis not present

## 2024-03-11 DIAGNOSIS — Z794 Long term (current) use of insulin: Secondary | ICD-10-CM | POA: Diagnosis not present

## 2024-03-11 DIAGNOSIS — N186 End stage renal disease: Secondary | ICD-10-CM | POA: Diagnosis not present

## 2024-03-11 DIAGNOSIS — Z992 Dependence on renal dialysis: Secondary | ICD-10-CM | POA: Diagnosis not present

## 2024-03-11 DIAGNOSIS — E1122 Type 2 diabetes mellitus with diabetic chronic kidney disease: Secondary | ICD-10-CM | POA: Diagnosis not present

## 2024-03-11 DIAGNOSIS — D631 Anemia in chronic kidney disease: Secondary | ICD-10-CM | POA: Diagnosis not present

## 2024-03-11 DIAGNOSIS — N2581 Secondary hyperparathyroidism of renal origin: Secondary | ICD-10-CM | POA: Diagnosis not present

## 2024-03-11 DIAGNOSIS — T8249XA Other complication of vascular dialysis catheter, initial encounter: Secondary | ICD-10-CM | POA: Diagnosis not present

## 2024-03-11 DIAGNOSIS — D689 Coagulation defect, unspecified: Secondary | ICD-10-CM | POA: Diagnosis not present

## 2024-03-13 ENCOUNTER — Other Ambulatory Visit: Payer: Self-pay | Admitting: Orthopedic Surgery

## 2024-03-13 ENCOUNTER — Ambulatory Visit (INDEPENDENT_AMBULATORY_CARE_PROVIDER_SITE_OTHER): Admitting: Physical Therapy

## 2024-03-13 ENCOUNTER — Encounter: Payer: Self-pay | Admitting: Physical Therapy

## 2024-03-13 DIAGNOSIS — R296 Repeated falls: Secondary | ICD-10-CM

## 2024-03-13 DIAGNOSIS — M6281 Muscle weakness (generalized): Secondary | ICD-10-CM | POA: Diagnosis not present

## 2024-03-13 DIAGNOSIS — R262 Difficulty in walking, not elsewhere classified: Secondary | ICD-10-CM | POA: Diagnosis not present

## 2024-03-13 DIAGNOSIS — M25561 Pain in right knee: Secondary | ICD-10-CM | POA: Diagnosis not present

## 2024-03-13 DIAGNOSIS — S72141D Displaced intertrochanteric fracture of right femur, subsequent encounter for closed fracture with routine healing: Secondary | ICD-10-CM | POA: Diagnosis not present

## 2024-03-13 DIAGNOSIS — R2681 Unsteadiness on feet: Secondary | ICD-10-CM

## 2024-03-13 DIAGNOSIS — G8929 Other chronic pain: Secondary | ICD-10-CM | POA: Diagnosis not present

## 2024-03-13 DIAGNOSIS — M25532 Pain in left wrist: Secondary | ICD-10-CM | POA: Diagnosis not present

## 2024-03-13 NOTE — Therapy (Signed)
 OUTPATIENT PHYSICAL THERAPY LOWER EXTREMITY TREATMENT   Patient Name: [name] [name] MRN: [id number] DOB:February 22, 1971, 53 y.o., male Today's Date: 03/13/2024  END OF SESSION:  PT End of Session - 03/13/24 1302     Visit Number 9    Number of Visits 16    Date for PT Re-Evaluation 03/31/24    Authorization Type UHC dual complete no authorization needed  20% coinsurance    Progress Note Due on Visit 10    PT Start Time 1302    PT Stop Time 1340    PT Time Calculation (min) 38 min    Activity Tolerance Patient tolerated treatment well;No increased pain;Patient limited by pain;Patient limited by fatigue    Behavior During Therapy [facility] for tasks assessed/performed                Past Medical History:  Diagnosis Date   Abdominal pain 11/30/2023   Abdominal tenderness 10/14/2023   Allergy    seasonal and environmental   Anemia    Chest pain 12/01/2023   Chronic constipation 05/13/2020   Depression    Diabetes mellitus without complication (HCC) 2019   Dyspnea    hx - SOB with exertion   ESRD on hemodialysis (HCC)    Tues, Thurs, Sat   GERD (gastroesophageal reflux disease)    Hyperlipidemia    Hypertension    Necrotizing fasciitis (HCC) 10/13/2017   Neuromuscular disorder (HCC)    neuropathy   PAD (peripheral artery disease) (HCC)    Secondary hyperparathyroidism (HCC)    SVT (supraventricular tachycardia) (HCC)    hx   Wound dehiscence 11/24/2017   Past Surgical History:  Procedure Laterality Date   AMPUTATION Right 10/13/2017   Procedure: RIGHT BELOW KNEE AMPUTATION;  Surgeon: Timothy Ford, MD;  Location: [facility] OR;  Service: Orthopedics;  Laterality: Right;   AMPUTATION Right 11/24/2017   Procedure: AMPUTATION BELOW KNEE REVISION;  Surgeon: Timothy Ford, MD;  Location: [facility];  Service: Orthopedics;  Laterality: Right;   AMPUTATION TOE Left    AV FISTULA PLACEMENT Left 10/19/2023   Procedure: LEFT BRACHIOCEPHALIC ARTERIOVENOUS (AV) FISTULA CREATION;   Surgeon: Philipp Brawn, MD;  Location: [facility] OR;  Service: Vascular;  Laterality: Left;   AV FISTULA PLACEMENT Left 01/26/2024   Procedure: LEFT ARTERIOVENOUS (AV) FISTULA CREATION;  Surgeon: Philipp Brawn, MD;  Location: [facility] OR;  Service: Vascular;  Laterality: Left;   BIOPSY  09/02/2021   Procedure: BIOPSY;  Surgeon: Daina Drum, MD;  Location: [facility] ENDOSCOPY;  Service: Gastroenterology;;   BIOPSY  02/18/2023   Procedure: BIOPSY;  Surgeon: Ace Holder, MD;  Location: [facility] ENDOSCOPY;  Service: Gastroenterology;;   COLONOSCOPY  05/11/2019   COLONOSCOPY  2021   3 polyps - tubular adenomas   ESOPHAGOGASTRODUODENOSCOPY (EGD) WITH PROPOFOL  N/A 09/02/2021   Procedure: ESOPHAGOGASTRODUODENOSCOPY (EGD) WITH PROPOFOL ;  Surgeon: Daina Drum, MD;  Location: [facility] ENDOSCOPY;  Service: Gastroenterology;  Laterality: N/A;   ESOPHAGOGASTRODUODENOSCOPY (EGD) WITH PROPOFOL  N/A 02/18/2023   Procedure: ESOPHAGOGASTRODUODENOSCOPY (EGD) WITH PROPOFOL ;  Surgeon: Ace Holder, MD;  Location: [facility] ENDOSCOPY;  Service: Gastroenterology;  Laterality: N/A;   INTRAMEDULLARY (IM) NAIL INTERTROCHANTERIC Right 10/15/2023   Procedure: INTRAMEDULLARY (IM) NAIL INTERTROCHANTERIC;  Surgeon: Laneta Pintos, MD;  Location: [facility] OR;  Service: Orthopedics;  Laterality: Right;   IR FLUORO GUIDE CV LINE RIGHT  10/15/2023   IR US  GUIDE VASC ACCESS RIGHT  10/15/2023   POLYPECTOMY     WISDOM TOOTH EXTRACTION  Patient Active Problem List   Diagnosis Date Noted   Nausea and vomiting 01/26/2024   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/25/2024   Depression 01/24/2024   Hypertensive urgency 01/24/2024   Hypotension 12/01/2023   Altered mental status 11/30/2023   ESRD (end stage renal disease) on dialysis (HCC) 11/30/2023   Right knee pain 10/24/2023   Nonimmune to hepatitis B virus 10/22/2023   Closed fracture of right hip (HCC) 10/15/2023   T2DM (type 2 diabetes mellitus) (HCC) 10/14/2023   Urinary retention 10/14/2023   Closed  intertrochanteric fracture of right hip (HCC) 10/13/2023   Patellar tendinitis 10/12/2023   Wrist sprain, left, initial encounter 10/12/2023   Contracture, 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   Mucosal abnormality of esophagus 02/18/2023   Foot pain, left 02/17/2023   SOB (shortness of breath) 02/16/2023   History of esophagitis 02/16/2023   Tachycardia 02/15/2023   Allergic rhinitis 11/07/2022   Chronic kidney disease, stage 5 (HCC) 09/11/2022   Tobacco dependence 09/11/2022   GERD with esophagitis 09/02/2021   Acute kidney injury superimposed on CKD (HCC) 04/10/2021   Achilles tendon contracture, left 03/03/2018   Non-pressure chronic ulcer of other part of left foot limited to breakdown of skin (HCC) 02/02/2018   History of right below knee amputation (HCC) 12/22/2017   Acute posthemorrhagic anemia    Falls 11/24/2017   Severe hypertension 11/24/2017   Anemia 11/24/2017   Uncontrolled type 2 diabetes mellitus with hyperglycemia, with long-term current use of insulin  (HCC) 10/11/2017   ARF (acute renal failure) (HCC) 10/11/2017    PCP: Genora Kidd, MD  REFERRING PROVIDER: Timothy Ford, MD  REFERRING DIAG: [phone] (ICD-10-CM) - Chronic pain of right knee  THERAPY DIAG:  Difficulty in walking, not elsewhere classified  Unsteadiness on feet  Repeated falls  Chronic pain of right knee  Muscle weakness (generalized)  Rationale for Evaluation and Treatment: Rehabilitation  ONSET DATE: 09/2023   SUBJECTIVE:   SUBJECTIVE STATEMENT: [facility]  He is ordering MRI for left hand.     PERTINENT HISTORY: Type 2 diabetes, HLD, HTN, peripheral artery disease, Rt below knee amputation, left toe amputation, end stage renal disease, right hip fracture in 09/2023  PAIN:  Are you having pain? Yes: NPRS scale:  at rest 9/10 and over last week 8/10 at best & 10/10 at worst Pain location: Super-medial knee and above the knee cap Pain description:  Sharp, worsens with increased weight-bearing Aggravating factors: Standing and walking, stand to sit transition, knee flexion and hip flexion Relieving factors: Getting off the knee  PRECAUTIONS:  NO BP LUE, fall risk  RED FLAGS: None   WEIGHT BEARING RESTRICTIONS:  Reg notes increased WB increases symptoms  FALLS:  Has patient fallen in last 6 months?  Yes, cause of initial injury but no falls since that time.  LIVING ENVIRONMENT: Lives with: lives with their family Lives in: Mobile home Stairs:  Needs handrails Has following equipment at home: Single point cane, Environmental consultant - 4 wheeled, Wheelchair (manual), shower chair, and shower bars  OCCUPATION: Disability, drive, racing  PLOF:  Had 3 falls previous before the fall that resulted in a fracture   PATIENT GOALS: Be able to walk safely without the walker and with less knee pain  NEXT MD VISIT: 04/03/2024  OBJECTIVE:  Note: Objective measures were completed at Evaluation unless otherwise noted.  DIAGNOSTIC FINDINGS: 1. No meniscal or ligamentous injury of the right knee. 2. Mild osseous contusion of the posteromedial tibial plateau. 3. Severe  edema in infrapatellar Hoffa's fat likely reflecting a contusion secondary to direct trauma.  PATIENT SURVEYS:  Patient-Specific Activity Scoring Scheme  "0" represents "unable to perform." "10" represents "able to perform at prior level. 0 1 2 3 4 5 6 7 8 9  10 (Date and Score)   Activity 02/16/2024  03/06/2024  1. Walking with cane in community  0  0  2. Walking in house without device  0  0  3. Picking up items from floor 0 0  4.    5.    Score 0 0   Total score = sum of the activity scores/number of activities Minimum detectable change (90%CI) for average score = 2 points Minimum detectable change (90%CI) for single activity score = 3 points    COGNITION: Overall cognitive status: Within functional limits for tasks assessed     SENSATION: Diabetic neuropathy of the left  foot and some tingling above the right knee  EDEMA:  Rumeal notes swelling his left side but he gets off the right side before swelling is an issue  LOWER EXTREMITY ROM:  Active ROM Left/Right 02/04/2024   Hip flexion    Hip extension    Hip abduction    Hip adduction    Hip internal rotation    Hip external rotation    Knee flexion 140/118   Knee extension 0/0   Ankle dorsiflexion    Ankle plantarflexion    Ankle inversion    Ankle eversion     (Blank rows = not tested)  LOWER EXTREMITY STRENGTH:  Strength was assessed using circumferential thigh girth 15 cm proximal to the superior patellar pole Left/Right 02/04/2024 02/28/2024 Left/Right  Hip flexion    Hip extension    Hip abduction    Hip adduction    Hip internal rotation    Hip external rotation    Knee flexion    Knee extension 48.5 cm/47.0 cm 10-15% strength deficit per centimeter of atrophy 48.5 cm/47.0 cm 10-15% strength deficit per centimeter of atrophy  Ankle dorsiflexion    Ankle plantarflexion    Ankle inversion    Ankle eversion     (Blank rows = not tested)  GAIT: Evaluation on 02/04/2024: Distance walked: 50 feet Assistive device utilized: Environmental consultant - 4 wheeled Level of assistance: Complete Independence Comments: [name] [name] notes he has increased knee pain immediately upon standing.  He is very limited in his standing and walking endurance as a result.  Prosthetic Wear & Care: 02/16/2024:  He is wearing prosthesis from arising to bedtime.   No skin issues noted.                                                                                                                             TREATMENT DATE:  03/13/2024 Therapeutic Exercise: Nustep seat 15 Level 7 with BLEs & BUEs for 4  min, then level 5 BLEs only 4 min Seated quad set 5 sec hold 10 reps.  Seated small range SLR 10 reps Eccentric LAQ 10 reps 2 sets  Neuromuscular Re-education: Step forward (heel past contralateral toes) with 2.5" between  midfoot - 30 sec static RLE in front & in back 2 reps ea with intermittent touch.  PT manual resistance for quad set/hamstring set/quad set at various knee positions to facilitate co-contraction knee control.   Therapeutic Activities: 2" step up, over & step down with ea LE (contralateral LE stepping from terminal stance to initial contact position stepping over foam beam) BUE support //bars 6 reps.    TREATMENT DATE:  03/08/2024 Quadriceps sets supine with pillow below knees, pushed a bit higher up under the distal thigh (ask [name] [name] if it is comfortable), no prosthesis 2 sets 10 x 5 seconds Seated straight leg raises 3 sets of 5 bilateral (no prosthesis) Seated knee extension isometrics with PT seated at the edge of the treatment table 2 sets of 10 for 5 seconds Discussed how this could be done with Thera-Band at home, will do again on [name] [name]'s next visit  Neuromuscular re-education: Wide tandem balance 3 x 20 seconds bilateral (encourage slight knee flexion).  Needed breaks due to fatigue.  Functional Activities: Step-down from 4 to 2 inch step with hands and slow eccentrics 10 x   03/06/2024 Quadriceps sets supine with pillow below knees, no prosthesis 10 x 5 seconds Seated straight leg raises 5 sets of 5 bilateral (PT with slight assist with a strap around the distal prosthesis for the first 3 sets, no prosthesis for the final 2 sets) Discussed changes to long-term HEP and scheduling for another 4 weeks of supervised PT  Neuromuscular re-education: Wide tandem balance 5 x 20 seconds bilateral (encourage slight knee flexion).    03/01/2024  Therapeutic Exercise: Nustep seat 15 Level 7 with BLEs & BUEs for 4  min, then level 6 BLEs only 4 min Seated LAQ 10 reps 2 sets ea LE.  RLE full range without assist no weight and LLE 2#. Seated hamstring curl RLE red Theraband with assist (only last 25% today, was last 50%) concentric for full range & controlled eccentric 10 reps 1 sets; LLE  green theraband 10 reps 1 sets.   Therapeutic Activities: Leg Press BLEs 56# 10 reps 2 sets;  single leg 25# LLE & 12# RLE (unable to move with 25# or 18#) 10 reps ea LE Standing with locked rollator walker support: hip flexion SLR & hip ext with RLE red Tband & LLE green Tband 10 reps 2 sets ea LE. Standing with locked rollator walker support:  abduction red band around knees 10 reps ea LE. Alternating LEs.   PATIENT EDUCATION:  Education details: See above Person educated: Patient Education method: Explanation, Demonstration, Tactile cues, Verbal cues, and Handouts Education comprehension: verbalized understanding, returned demonstration, verbal cues required, tactile cues required, and needs further education  HOME EXERCISE PROGRAM: Access Code: [email or web address] URL: [email or web address] Date: 02/23/2024 Prepared by: Lorie Rook  Exercises - Supine Quadricep Sets  - 5 x daily - 7 x weekly - 2 sets - 10 reps - 5 second hold - Seated Long Arc Quad  - 1 x daily - 4 x weekly - 2 sets - 10 reps - 5 seconds hold - Standing Hip Abduction Alternating legs  - 1 x daily - 4 x weekly - 2 sets - 10 reps - 5 seconds hold - Alternating Hip Flexion Forward Kicks  - 1 x daily - 4 x weekly - 2 sets - 10 reps - 5 seconds  hold - Standing Hip Extension with Counter Support  - 1 x daily - 4 x weekly - 2 sets - 10 reps - 5 seconds hold  ASSESSMENT:  CLINICAL IMPRESSION: Patient continues to be significantly limited by knee pain.  Continue current POC focused on quadriceps strength, weight-bearing function and endurance.   Evaluation on 02/04/2024:  Patient is a 53 y.o. male who was seen today for physical therapy evaluation and treatment for M25.561,G89.29 (ICD-10-CM) - Chronic pain of right knee.  [name] [name] had a fall in November 2024 that resulted in a hip fracture.  His knee pain has been present since that time.  It is very likely that [name] [name]'s right knee pain is related to the decrease in  activity, strength and atrophy related to this fall and fracture.  Manual palpation of quadriceps sets shows significantly decreased right quadriceps activation as compared to the left and significant atrophy is noted of the right thigh.  [name] [name] was started on an appropriate home exercise program to improve quadricep strength, knee pain and allow him to improve his standing and walking endurance.  Given the 46-month history of this problem, and because of his other medical issues including severe diabetes and a below-knee amputation, [name] [name] may require a longer than normal course of rehabilitation.  OBJECTIVE IMPAIRMENTS: Abnormal gait, cardiopulmonary status limiting activity, decreased activity tolerance, decreased balance, decreased endurance, decreased knowledge of condition, difficulty walking, decreased ROM, decreased strength, increased edema, impaired perceived functional ability, and pain.   ACTIVITY LIMITATIONS: carrying, bending, standing, squatting, stairs, transfers, and locomotion level  PARTICIPATION LIMITATIONS: community activity  PERSONAL FACTORS: Type 2 diabetes, HLD, HTN, peripheral artery disease, Rt below knee amputation, left toe amputation, end stage renal disease, right hip fracture in 09/2023 are also affecting patient's functional outcome.   REHAB POTENTIAL: Good  CLINICAL DECISION MAKING: Stable/uncomplicated  EVALUATION COMPLEXITY: Moderate   GOALS: Goals reviewed with patient? Yes  SHORT TERM GOALS: Target date: 03/03/2024 [name] [name] will be independent with his day 1 home exercise program Baseline: Started 02/04/2024 Goal status: MET  02/28/2024  2.  Improve right quadriceps strength as assessed by improved quadriceps activation as assessed manually by the physical therapist during a quadriceps set exercise Baseline: Poor activation at evaluation Goal status: MET 02/28/2024   LONG TERM GOALS: Target date: 03/31/2024  Improve patient specific functional scale to at least  50% for walking in home & community and picking up items Baseline: 0% Goal status: Ongoing  03/13/2024  2.  [name] [name] will report right knee pain no greater than 3 out of 10 on the visual analog scale Baseline: As high as 10 out of 10 Goal status: Ongoing  03/13/2024  3.  [name] [name] will be able to complete a set of 10 consecutive seated straight leg raises, demonstrating improved quadriceps strength Baseline: Unable at evaluation Goal status: Ongoing  03/13/2024  4.  [name] [name] will report improved standing and walking endurance as compared to evaluation Baseline: Significantly increased right knee pain immediately upon standing which severely limits standing and walking endurance Goal status: Ongoing  03/13/2024  5.  [name] [name] will be independent with his long-term maintenance home exercise program Baseline: Started 02/04/2024 Goal status: Ongoing 03/13/2024  PLAN:  PT FREQUENCY: 1-2x/week  PT DURATION: 4 additional weeks  PLANNED INTERVENTIONS: 97110-Therapeutic exercises, 97530- Therapeutic activity, 97112- Neuromuscular re-education, 97535- Self Care, 82956- Manual therapy, [id number]- Gait training, [phone]- Prosthetic training, [phone]- Vasopneumatic device, Patient/Family education, Balance training, Stair training, and Cryotherapy  PLAN FOR NEXT SESSION: Continue with quadriceps strengthening, balance,  appropriate functional progressions keeping in mind his difficulty with WB function   Lorie Rook, PT, DPT 03/13/2024, 3:58 PM

## 2024-03-14 DIAGNOSIS — T8249XA Other complication of vascular dialysis catheter, initial encounter: Secondary | ICD-10-CM | POA: Diagnosis not present

## 2024-03-14 DIAGNOSIS — E1122 Type 2 diabetes mellitus with diabetic chronic kidney disease: Secondary | ICD-10-CM | POA: Diagnosis not present

## 2024-03-14 DIAGNOSIS — I132 Hypertensive heart and chronic kidney disease with heart failure and with stage 5 chronic kidney disease, or end stage renal disease: Secondary | ICD-10-CM | POA: Diagnosis not present

## 2024-03-14 DIAGNOSIS — D689 Coagulation defect, unspecified: Secondary | ICD-10-CM | POA: Diagnosis not present

## 2024-03-14 DIAGNOSIS — Z992 Dependence on renal dialysis: Secondary | ICD-10-CM | POA: Diagnosis not present

## 2024-03-14 DIAGNOSIS — D631 Anemia in chronic kidney disease: Secondary | ICD-10-CM | POA: Diagnosis not present

## 2024-03-14 DIAGNOSIS — N186 End stage renal disease: Secondary | ICD-10-CM | POA: Diagnosis not present

## 2024-03-14 DIAGNOSIS — N2581 Secondary hyperparathyroidism of renal origin: Secondary | ICD-10-CM | POA: Diagnosis not present

## 2024-03-15 ENCOUNTER — Other Ambulatory Visit: Payer: Self-pay

## 2024-03-15 MED ORDER — ACCU-CHEK SOFTCLIX LANCETS MISC: 12 refills | Status: DC

## 2024-03-15 NOTE — Telephone Encounter (Signed)
 Patient calls nurse line requesting a refill on lancets .   He reports testing BID with Accu Chek monitor.   Will forward to PCP.

## 2024-03-16 ENCOUNTER — Telehealth: Payer: Self-pay

## 2024-03-16 DIAGNOSIS — I132 Hypertensive heart and chronic kidney disease with heart failure and with stage 5 chronic kidney disease, or end stage renal disease: Secondary | ICD-10-CM | POA: Diagnosis not present

## 2024-03-16 DIAGNOSIS — E1122 Type 2 diabetes mellitus with diabetic chronic kidney disease: Secondary | ICD-10-CM | POA: Diagnosis not present

## 2024-03-16 DIAGNOSIS — Z992 Dependence on renal dialysis: Secondary | ICD-10-CM | POA: Diagnosis not present

## 2024-03-16 DIAGNOSIS — D689 Coagulation defect, unspecified: Secondary | ICD-10-CM | POA: Diagnosis not present

## 2024-03-16 DIAGNOSIS — T8249XA Other complication of vascular dialysis catheter, initial encounter: Secondary | ICD-10-CM | POA: Diagnosis not present

## 2024-03-16 DIAGNOSIS — N2581 Secondary hyperparathyroidism of renal origin: Secondary | ICD-10-CM | POA: Diagnosis not present

## 2024-03-16 DIAGNOSIS — N186 End stage renal disease: Secondary | ICD-10-CM | POA: Diagnosis not present

## 2024-03-16 DIAGNOSIS — D631 Anemia in chronic kidney disease: Secondary | ICD-10-CM | POA: Diagnosis not present

## 2024-03-16 NOTE — Telephone Encounter (Signed)
 Patient calls nurse line in regards to Tizanidine  prescription.   He reports he requested a refill from the pharmacy, however was told any refill will be too soon to fill.   He reports he is taking 4mg  per day and not 2mg  as prescription states.   He reports he takes 1/2 in AM and 1/2 in PM.  Advised will send to PCP for advisement.   Will need new script reflecting 4mg  daily if appropriate.

## 2024-03-17 ENCOUNTER — Encounter (HOSPITAL_COMMUNITY)
Admission: RE | Disposition: A | Discharge status: Home / Self Care | Payer: Self-pay | Source: Home / Self Care | Attending: Vascular Surgery

## 2024-03-17 ENCOUNTER — Other Ambulatory Visit: Payer: Self-pay

## 2024-03-17 ENCOUNTER — Ambulatory Visit (HOSPITAL_COMMUNITY)
Admission: RE | Admit: 2024-03-17 | Discharge: 2024-03-17 | Disposition: A | Discharge status: Home / Self Care | Attending: Vascular Surgery | Admitting: Vascular Surgery

## 2024-03-17 DIAGNOSIS — Z992 Dependence on renal dialysis: Secondary | ICD-10-CM | POA: Diagnosis not present

## 2024-03-17 DIAGNOSIS — Y832 Surgical operation with anastomosis, bypass or graft as the cause of abnormal reaction of the patient, or of later complication, without mention of misadventure at the time of the procedure: Secondary | ICD-10-CM | POA: Insufficient documentation

## 2024-03-17 DIAGNOSIS — N185 Chronic kidney disease, stage 5: Secondary | ICD-10-CM | POA: Diagnosis not present

## 2024-03-17 DIAGNOSIS — N186 End stage renal disease: Secondary | ICD-10-CM | POA: Diagnosis not present

## 2024-03-17 DIAGNOSIS — T82898A Other specified complication of vascular prosthetic devices, implants and grafts, initial encounter: Secondary | ICD-10-CM | POA: Diagnosis not present

## 2024-03-17 HISTORY — PX: A/V FISTULAGRAM: CATH118298

## 2024-03-17 LAB — GLUCOSE, CAPILLARY: Glucose-Capillary: 168 mg/dL — ABNORMAL HIGH (ref 70–99)

## 2024-03-17 LAB — POCT I-STAT, CHEM 8
BUN: 30 mg/dL — ABNORMAL HIGH (ref 6–20)
Calcium, Ion: 1 mmol/L — ABNORMAL LOW (ref 1.15–1.40)
Chloride: 101 mmol/L (ref 98–111)
Creatinine, Ser: 6.9 mg/dL — ABNORMAL HIGH (ref 0.61–1.24)
Glucose, Bld: 163 mg/dL — ABNORMAL HIGH (ref 70–99)
HCT: 33 % — ABNORMAL LOW (ref 39.0–52.0)
Hemoglobin: 11.2 g/dL — ABNORMAL LOW (ref 13.0–17.0)
Potassium: 4.7 mmol/L (ref 3.5–5.1)
Sodium: 138 mmol/L (ref 135–145)
TCO2: 29 mmol/L (ref 22–32)

## 2024-03-17 SURGERY — A/V FISTULAGRAM
Anesthesia: LOCAL

## 2024-03-17 MED ORDER — TIZANIDINE HCL 2 MG PO TABS: 2.0000 mg | ORAL_TABLET | Freq: Two times a day (BID) | ORAL | 0 refills | Status: DC | PRN

## 2024-03-17 MED ORDER — LIDOCAINE HCL (PF) 1 % IJ SOLN
INTRAMUSCULAR | Status: AC
  Filled 2024-03-17: qty 30

## 2024-03-17 MED ORDER — LIDOCAINE HCL (PF) 1 % IJ SOLN
INTRAMUSCULAR | Status: DC | PRN
Start: 2024-03-17 — End: 2024-03-17
  Administered 2024-03-17: 5 mL via INTRADERMAL

## 2024-03-17 MED ORDER — IODIXANOL 320 MG/ML IV SOLN
INTRAVENOUS | Status: DC | PRN
  Administered 2024-03-17: 30 mL

## 2024-03-17 MED ORDER — HEPARIN (PORCINE) IN NACL 1000-0.9 UT/500ML-% IV SOLN
INTRAVENOUS | Status: DC | PRN
  Administered 2024-03-17: 500 mL

## 2024-03-17 MED ORDER — SODIUM CHLORIDE 0.9% FLUSH: 3.0000 mL | INTRAVENOUS | Status: DC | PRN

## 2024-03-17 SURGICAL SUPPLY — 7 items
BAG SNAP BAND KOVER 36X36 (MISCELLANEOUS) ×1 IMPLANT
COVER DOME SNAP 22 D (MISCELLANEOUS) ×1 IMPLANT
KIT MICROPUNCTURE NIT STIFF (SHEATH) IMPLANT
SHEATH PROBE COVER 6X72 (BAG) ×1 IMPLANT
STOPCOCK MORSE 400PSI 3WAY (MISCELLANEOUS) ×1 IMPLANT
TRAY PV CATH (CUSTOM PROCEDURE TRAY) ×1 IMPLANT
TUBING CIL FLEX 10 FLL-RA (TUBING) ×1 IMPLANT

## 2024-03-17 NOTE — Interval H&P Note (Signed)
 History and Physical Interval Note:  03/17/2024 7:50 AM  [name] [name]  has presented today for surgery, with the diagnosis of end stage renal disease.  The various methods of treatment have been discussed with the patient and family. After consideration of risks, benefits and other options for treatment, the patient has consented to  Procedure(s): A/V Fistulagram (N/A) as a surgical intervention.  The patient's history has been reviewed, patient examined, no change in status, stable for surgery.  I have reviewed the patient's chart and labs.  Questions were answered to the patient's satisfaction.     Carlene Che

## 2024-03-17 NOTE — Op Note (Addendum)
 DATE OF SERVICE: 03/17/2024  PATIENT:  [name] [name]  53 y.o. male  PRE-OPERATIVE DIAGNOSIS:  ESRD, left arm swelling  POST-OPERATIVE DIAGNOSIS:  Same  PROCEDURE:   1) ultrasound guided guided left arm arteriovenous fistula access 2) left arm fistulagram  SURGEON:  Surgeons and Role:    * Carlene Che, MD - Primary  ASSISTANT: none  ANESTHESIA:   local  EBL: minimal  BLOOD ADMINISTERED:none  DRAINS: none   LOCAL MEDICATIONS USED:  LIDOCAINE    SPECIMEN:  none  COUNTS: confirmed correct.  TOURNIQUET:  none  PATIENT DISPOSITION:  PACU - hemodynamically stable.   Delay start of Pharmacological VTE agent (>24hrs) due to surgical blood loss or risk of bleeding: no  INDICATION FOR PROCEDURE: JAMARCO ZALDIVAR is a 53 y.o. male with ESRD with left arm swelling and left arm arteriovenous fistula. After careful discussion of risks, benefits, and alternatives the patient was offered fistulagram. The patient understood and wished to proceed.  OPERATIVE FINDINGS: normal central venous system. No left subclavian stenosis. No left axillary vein stenosis. No left brachial or basilic vein stenosis.   DESCRIPTION OF PROCEDURE: After identification of the patient in the pre-operative holding area, the patient was transferred to the operating room. The patient was positioned supine on the operating room table. Anesthesia was induced. The left arm was prepped and draped in standard fashion. A surgical pause was performed confirming correct patient, procedure, and operative location.  Duplex ultrasound was used to obtain access in the left arm fistula.  Fistulogram was performed in stations.  No abnormality was identified.  All endovascular equipment was removed.  A figure-of-eight stitch was applied to the access site.  Hemostasis was achieved.  Upon completion of the case instrument and sharps counts were confirmed correct. The patient was transferred to the PACU in good condition.  I was present for all portions of the procedure.  FOLLOW UP PLAN: Patient should follow-up with [facility] PA after several weeks for evaluation of second stage basilic vein transposition.  No source for left arm swelling seen today.  Heber Little. Edgardo Goodwill, MD [name] Phone Number: [phone] 03/17/2024 7:50 AM

## 2024-03-17 NOTE — Telephone Encounter (Signed)
 Attempted to reach patient. No answer. LVM of message left by PCP.Linnie Riches, CMA

## 2024-03-18 DIAGNOSIS — I132 Hypertensive heart and chronic kidney disease with heart failure and with stage 5 chronic kidney disease, or end stage renal disease: Secondary | ICD-10-CM | POA: Diagnosis not present

## 2024-03-18 DIAGNOSIS — T8249XA Other complication of vascular dialysis catheter, initial encounter: Secondary | ICD-10-CM | POA: Diagnosis not present

## 2024-03-18 DIAGNOSIS — N2581 Secondary hyperparathyroidism of renal origin: Secondary | ICD-10-CM | POA: Diagnosis not present

## 2024-03-18 DIAGNOSIS — N186 End stage renal disease: Secondary | ICD-10-CM | POA: Diagnosis not present

## 2024-03-18 DIAGNOSIS — D689 Coagulation defect, unspecified: Secondary | ICD-10-CM | POA: Diagnosis not present

## 2024-03-18 DIAGNOSIS — E1122 Type 2 diabetes mellitus with diabetic chronic kidney disease: Secondary | ICD-10-CM | POA: Diagnosis not present

## 2024-03-18 DIAGNOSIS — Z992 Dependence on renal dialysis: Secondary | ICD-10-CM | POA: Diagnosis not present

## 2024-03-18 DIAGNOSIS — D631 Anemia in chronic kidney disease: Secondary | ICD-10-CM | POA: Diagnosis not present

## 2024-03-19 ENCOUNTER — Encounter (HOSPITAL_COMMUNITY): Payer: Self-pay | Admitting: Vascular Surgery

## 2024-03-21 DIAGNOSIS — D689 Coagulation defect, unspecified: Secondary | ICD-10-CM | POA: Diagnosis not present

## 2024-03-21 DIAGNOSIS — I132 Hypertensive heart and chronic kidney disease with heart failure and with stage 5 chronic kidney disease, or end stage renal disease: Secondary | ICD-10-CM | POA: Diagnosis not present

## 2024-03-21 DIAGNOSIS — N186 End stage renal disease: Secondary | ICD-10-CM | POA: Diagnosis not present

## 2024-03-21 DIAGNOSIS — E1122 Type 2 diabetes mellitus with diabetic chronic kidney disease: Secondary | ICD-10-CM | POA: Diagnosis not present

## 2024-03-21 DIAGNOSIS — D631 Anemia in chronic kidney disease: Secondary | ICD-10-CM | POA: Diagnosis not present

## 2024-03-21 DIAGNOSIS — T8249XA Other complication of vascular dialysis catheter, initial encounter: Secondary | ICD-10-CM | POA: Diagnosis not present

## 2024-03-21 DIAGNOSIS — Z992 Dependence on renal dialysis: Secondary | ICD-10-CM | POA: Diagnosis not present

## 2024-03-21 DIAGNOSIS — N2581 Secondary hyperparathyroidism of renal origin: Secondary | ICD-10-CM | POA: Diagnosis not present

## 2024-03-21 DIAGNOSIS — M25561 Pain in right knee: Secondary | ICD-10-CM | POA: Diagnosis not present

## 2024-03-21 DIAGNOSIS — R262 Difficulty in walking, not elsewhere classified: Secondary | ICD-10-CM | POA: Diagnosis not present

## 2024-03-22 DIAGNOSIS — I129 Hypertensive chronic kidney disease with stage 1 through stage 4 chronic kidney disease, or unspecified chronic kidney disease: Secondary | ICD-10-CM | POA: Diagnosis not present

## 2024-03-22 DIAGNOSIS — Z992 Dependence on renal dialysis: Secondary | ICD-10-CM | POA: Diagnosis not present

## 2024-03-22 DIAGNOSIS — N186 End stage renal disease: Secondary | ICD-10-CM | POA: Diagnosis not present

## 2024-03-23 ENCOUNTER — Ambulatory Visit: Admitting: Family Medicine

## 2024-03-23 ENCOUNTER — Ambulatory Visit: Admitting: Student

## 2024-03-23 VITALS — BP 165/76 | HR 84 | Ht 77.0 in | Wt 230.0 lb

## 2024-03-23 DIAGNOSIS — N186 End stage renal disease: Secondary | ICD-10-CM | POA: Diagnosis not present

## 2024-03-23 DIAGNOSIS — Z992 Dependence on renal dialysis: Secondary | ICD-10-CM | POA: Diagnosis not present

## 2024-03-23 DIAGNOSIS — T8249XA Other complication of vascular dialysis catheter, initial encounter: Secondary | ICD-10-CM | POA: Diagnosis not present

## 2024-03-23 DIAGNOSIS — J189 Pneumonia, unspecified organism: Secondary | ICD-10-CM | POA: Diagnosis not present

## 2024-03-23 DIAGNOSIS — D509 Iron deficiency anemia, unspecified: Secondary | ICD-10-CM | POA: Diagnosis not present

## 2024-03-23 DIAGNOSIS — E1122 Type 2 diabetes mellitus with diabetic chronic kidney disease: Secondary | ICD-10-CM | POA: Diagnosis not present

## 2024-03-23 DIAGNOSIS — D631 Anemia in chronic kidney disease: Secondary | ICD-10-CM | POA: Diagnosis not present

## 2024-03-23 DIAGNOSIS — N2581 Secondary hyperparathyroidism of renal origin: Secondary | ICD-10-CM | POA: Diagnosis not present

## 2024-03-23 DIAGNOSIS — D689 Coagulation defect, unspecified: Secondary | ICD-10-CM | POA: Diagnosis not present

## 2024-03-23 MED ORDER — DOXYCYCLINE HYCLATE 100 MG PO TABS: 100.0000 mg | ORAL_TABLET | Freq: Two times a day (BID) | ORAL | 0 refills | Status: AC

## 2024-03-23 MED ORDER — AMOXICILLIN-POT CLAVULANATE 250-125 MG PO TABS: 1.0000 | ORAL_TABLET | Freq: Every day | ORAL | 0 refills | Status: AC

## 2024-03-23 NOTE — Patient Instructions (Signed)
 It was great to see you today! Thank you for choosing [facility] for your primary care.  Today we addressed: Take Augmentin  250 mg once a day for 10 days  Take doxycycline  twice a day with food for 7 days  You can use your zofran   Stay hydrated as able   Speak to your dialysis team if you do not feel better Return Monday if not improving   If you haven't already, sign up for My Chart to have easy access to your labs results, and communication with your primary care physician.    Please arrive 15 minutes before your appointment to ensure smooth check in process.  We appreciate your efforts in making this happen.  Thank you for allowing me to participate in your care, Ernestina Headland, MD 03/23/2024, 2:08 PM PGY-3, [facility]

## 2024-03-23 NOTE — Progress Notes (Deleted)
    SUBJECTIVE:   CHIEF COMPLAINT / HPI:   Coughing with pain ***  PERTINENT  PMH / PSH: ***  OBJECTIVE:   There were no vitals taken for this visit.  ***  ASSESSMENT/PLAN:   Assessment & Plan    Edison Gore, MD [facility]

## 2024-03-23 NOTE — Progress Notes (Signed)
    SUBJECTIVE:   CHIEF COMPLAINT / HPI:   Cough  Congestion The patient, with end-stage renal disease on dialysis, presents with chest pain when coughing.  Congestion occur with each cough since Monday, with pain at the top of the chest. Rhinorrhea is present. There is no fever or dyspnea. Symptoms remain unchanged since onset.  Nyquil is used for symptom relief. No antibiotics are taken. There is no nausea, vomiting-last episode monday, or dysuria. Dialysis has been ongoing since the end of November, with a session attended today without issues. Urination is minimal, with no dysuria.  PERTINENT  PMH / PSH: ESRD on HD   OBJECTIVE:   BP (!) 165/76   Pulse 84   Ht 6\' 5"  (1.956 m)   Wt 230 lb (104.3 kg)   SpO2 98%   BMI 27.27 kg/m   General: Alert and oriented in no apparent distress Heart: Regular rate and rhythm with no murmurs appreciated Lungs: CTA bilaterally, diminished breath sounds at bases of lung, no wheezing  Abdomen: Bowel sounds present, no abdominal pain Skin: Warm and dry   ASSESSMENT/PLAN:   Assessment & Plan Pneumonia of both lower lobes due to infectious organism Differential diagnosis includes pneumonia, concerning with some focal diminishment on exam and comorbidities, treating with augmentin  250 q24 and doxy--instructed to update dialysis team when he goes on Saturday if he is still feeling unknown.  Clarified that the patient is not currently vomiting, encouraged continued hydration as able.  Strict return precautions and precautions for ED visit given to the patient.  He verbalized understanding. Reassured that he tolerated the entirety of HD today.  - Initiate antibiotic treatment    Elevated BP in setting of acute illness, recheck at next visit.   Ernestina Headland, MD [facility]

## 2024-03-24 ENCOUNTER — Encounter: Payer: Self-pay | Admitting: Rehabilitative and Restorative Service Providers"

## 2024-03-24 ENCOUNTER — Other Ambulatory Visit: Payer: Self-pay | Admitting: Student

## 2024-03-24 ENCOUNTER — Ambulatory Visit
Admission: RE | Admit: 2024-03-24 | Discharge: 2024-03-24 | Disposition: A | Discharge status: Home / Self Care | Source: Ambulatory Visit | Attending: Orthopedic Surgery | Admitting: Orthopedic Surgery

## 2024-03-24 ENCOUNTER — Ambulatory Visit (INDEPENDENT_AMBULATORY_CARE_PROVIDER_SITE_OTHER): Admitting: Rehabilitative and Restorative Service Providers"

## 2024-03-24 DIAGNOSIS — M6281 Muscle weakness (generalized): Secondary | ICD-10-CM | POA: Diagnosis not present

## 2024-03-24 DIAGNOSIS — R262 Difficulty in walking, not elsewhere classified: Secondary | ICD-10-CM

## 2024-03-24 DIAGNOSIS — M25561 Pain in right knee: Secondary | ICD-10-CM | POA: Diagnosis not present

## 2024-03-24 DIAGNOSIS — G8929 Other chronic pain: Secondary | ICD-10-CM

## 2024-03-24 DIAGNOSIS — R051 Acute cough: Secondary | ICD-10-CM

## 2024-03-24 DIAGNOSIS — R2681 Unsteadiness on feet: Secondary | ICD-10-CM

## 2024-03-24 DIAGNOSIS — M25532 Pain in left wrist: Secondary | ICD-10-CM | POA: Diagnosis not present

## 2024-03-24 DIAGNOSIS — R296 Repeated falls: Secondary | ICD-10-CM

## 2024-03-24 NOTE — Therapy (Signed)
 OUTPATIENT PHYSICAL THERAPY LOWER EXTREMITY TREATMENT   Patient Name: [name] [name] MRN: [id number] DOB:1971/01/29, 53 y.o., male Today's Date: 03/24/2024  END OF SESSION:  PT End of Session - 03/24/24 1016     Visit Number 10    Number of Visits 16    Date for PT Re-Evaluation 03/31/24    Authorization Type [employer] dual complete no authorization needed  20% coinsurance    Progress Note Due on Visit 16    PT Start Time 1015    PT Stop Time 1101    PT Time Calculation (min) 46 min    Activity Tolerance Patient tolerated treatment well;No increased pain;Patient limited by pain    Behavior During Therapy [facility] for tasks assessed/performed                 Past Medical History:  Diagnosis Date   Abdominal pain 11/30/2023   Abdominal tenderness 10/14/2023   Allergy    seasonal and environmental   Anemia    Chest pain 12/01/2023   Chronic constipation 05/13/2020   Depression    Diabetes mellitus without complication (HCC) 2019   Dyspnea    hx - SOB with exertion   ESRD on hemodialysis (HCC)    Tues, Thurs, Sat   GERD (gastroesophageal reflux disease)    Hyperlipidemia    Hypertension    Necrotizing fasciitis (HCC) 10/13/2017   Neuromuscular disorder (HCC)    neuropathy   PAD (peripheral artery disease) (HCC)    Secondary hyperparathyroidism (HCC)    SVT (supraventricular tachycardia) (HCC)    hx   Wound dehiscence 11/24/2017   Past Surgical History:  Procedure Laterality Date   A/V FISTULAGRAM N/A 03/17/2024   Procedure: A/V Fistulagram;  Surgeon: [name] Che, MD;  Location: [facility] INVASIVE CV LAB;  Service: Cardiovascular;  Laterality: N/A;   AMPUTATION Right 10/13/2017   Procedure: RIGHT BELOW KNEE AMPUTATION;  Surgeon: [name] Ford, MD;  Location: [facility] OR;  Service: Orthopedics;  Laterality: Right;   AMPUTATION Right 11/24/2017   Procedure: AMPUTATION BELOW KNEE REVISION;  Surgeon: [name] Ford, MD;  Location: [facility] OR;  Service: Orthopedics;  Laterality:  Right;   AMPUTATION TOE Left    AV FISTULA PLACEMENT Left 10/19/2023   Procedure: LEFT BRACHIOCEPHALIC ARTERIOVENOUS (AV) FISTULA CREATION;  Surgeon: [name] Brawn, MD;  Location: [facility] OR;  Service: Vascular;  Laterality: Left;   AV FISTULA PLACEMENT Left 01/26/2024   Procedure: LEFT ARTERIOVENOUS (AV) FISTULA CREATION;  Surgeon: [name] Brawn, MD;  Location: [facility] OR;  Service: Vascular;  Laterality: Left;   BIOPSY  09/02/2021   Procedure: BIOPSY;  Surgeon: [name] Drum, MD;  Location: [facility] ENDOSCOPY;  Service: Gastroenterology;;   BIOPSY  02/18/2023   Procedure: BIOPSY;  Surgeon: [name] Holder, MD;  Location: [facility] ENDOSCOPY;  Service: Gastroenterology;;   COLONOSCOPY  05/11/2019   COLONOSCOPY  2021   3 polyps - tubular adenomas   ESOPHAGOGASTRODUODENOSCOPY (EGD) WITH PROPOFOL  N/A 09/02/2021   Procedure: ESOPHAGOGASTRODUODENOSCOPY (EGD) WITH PROPOFOL ;  Surgeon: [name] Drum, MD;  Location: [facility] ENDOSCOPY;  Service: Gastroenterology;  Laterality: N/A;   ESOPHAGOGASTRODUODENOSCOPY (EGD) WITH PROPOFOL  N/A 02/18/2023   Procedure: ESOPHAGOGASTRODUODENOSCOPY (EGD) WITH PROPOFOL ;  Surgeon: [name] Holder, MD;  Location: [facility] ENDOSCOPY;  Service: Gastroenterology;  Laterality: N/A;   INTRAMEDULLARY (IM) NAIL INTERTROCHANTERIC Right 10/15/2023   Procedure: INTRAMEDULLARY (IM) NAIL INTERTROCHANTERIC;  Surgeon: [name] Pintos, MD;  Location: [facility] OR;  Service: Orthopedics;  Laterality: Right;   IR FLUORO GUIDE CV LINE  RIGHT  10/15/2023   IR US  GUIDE VASC ACCESS RIGHT  10/15/2023   POLYPECTOMY     WISDOM TOOTH EXTRACTION     Patient Active Problem List   Diagnosis Date Noted   Nausea and vomiting 01/26/2024   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/25/2024   Depression 01/24/2024   Hypertensive urgency 01/24/2024   Hypotension 12/01/2023   Altered mental status 11/30/2023   ESRD (end stage renal disease) on dialysis (HCC) 11/30/2023   Right knee pain 10/24/2023   Nonimmune to hepatitis B virus 10/22/2023    Closed fracture of right hip (HCC) 10/15/2023   T2DM (type 2 diabetes mellitus) (HCC) 10/14/2023   Urinary retention 10/14/2023   Closed intertrochanteric fracture of right hip (HCC) 10/13/2023   Patellar tendinitis 10/12/2023   Wrist sprain, left, initial encounter 10/12/2023   Contracture, 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   Mucosal abnormality of esophagus 02/18/2023   Foot pain, left 02/17/2023   SOB (shortness of breath) 02/16/2023   History of esophagitis 02/16/2023   Tachycardia 02/15/2023   Allergic rhinitis 11/07/2022   Chronic kidney disease, stage 5 (HCC) 09/11/2022   Tobacco dependence 09/11/2022   GERD with esophagitis 09/02/2021   Acute kidney injury superimposed on CKD (HCC) 04/10/2021   Achilles tendon contracture, left 03/03/2018   Non-pressure chronic ulcer of other part of left foot limited to breakdown of skin (HCC) 02/02/2018   History of right below knee amputation (HCC) 12/22/2017   Acute posthemorrhagic anemia    Falls 11/24/2017   Severe hypertension 11/24/2017   Anemia 11/24/2017   Uncontrolled type 2 diabetes mellitus with hyperglycemia, with long-term current use of insulin  (HCC) 10/11/2017   ARF (acute renal failure) (HCC) 10/11/2017    PCP: [name] Kidd, MD  REFERRING PROVIDER: Timothy Ford, MD  REFERRING DIAG: [phone] (ICD-10-CM) - Chronic pain of right knee  THERAPY DIAG:  Difficulty in walking, not elsewhere classified  Unsteadiness on feet  Repeated falls  Chronic pain of right knee  Muscle weakness (generalized)  Rationale for Evaluation and Treatment: Rehabilitation  ONSET DATE: 09/2023   SUBJECTIVE:   SUBJECTIVE STATEMENT: [name] [name] reports good HEP compliance.  He is still very week with his thighs bilaterally (Rt more so than left).   PERTINENT HISTORY: Type 2 diabetes, HLD, HTN, peripheral artery disease, Rt below knee amputation, left toe amputation, end stage renal disease, right hip fracture in 09/2023  PAIN:  Are you  having pain? Yes: NPRS scale: Unchanged Pain location: Supero-medial right knee and above the knee cap Pain description: Sharp, worsens with increased weight-bearing Aggravating factors: Standing and walking, stand to sit transition, knee flexion and hip flexion Relieving factors: Getting off the knee  PRECAUTIONS:  NO BP LUE, fall risk  RED FLAGS: None   WEIGHT BEARING RESTRICTIONS:  Virgil notes increased WB increases symptoms  FALLS:  Has patient fallen in last 6 months?  Yes, cause of initial injury but no falls since that time.  LIVING ENVIRONMENT: Lives with: lives with their family Lives in: Mobile home Stairs:  Needs handrails Has following equipment at home: Single point cane, Environmental consultant - 4 wheeled, Wheelchair (manual), shower chair, and shower bars  OCCUPATION: Disability, drive, racing  PLOF:  Had 3 falls previous before the fall that resulted in a fracture   PATIENT GOALS: Be able to walk safely without the walker and with less knee pain  NEXT MD VISIT: 04/03/2024  OBJECTIVE:  Note: Objective measures were completed at Evaluation unless otherwise noted.  DIAGNOSTIC FINDINGS: 1. No meniscal or  ligamentous injury of the right knee. 2. Mild osseous contusion of the posteromedial tibial plateau. 3. Severe edema in infrapatellar Hoffa's fat likely reflecting a contusion secondary to direct trauma.  PATIENT SURVEYS:  Patient-Specific Activity Scoring Scheme  "0" represents "unable to perform." "10" represents "able to perform at prior level. 0 1 2 3 4 5 6 7 8 9  10 (Date and Score)   Activity 02/16/2024  03/06/2024  1. Walking with cane in community  0  0  2. Walking in house without device  0  0  3. Picking up items from floor 0 0  4.    5.    Score 0 0   Total score = sum of the activity scores/number of activities Minimum detectable change (90%CI) for average score = 2 points Minimum detectable change (90%CI) for single activity score = 3 points     COGNITION: Overall cognitive status: Within functional limits for tasks assessed     SENSATION: Diabetic neuropathy of the left foot and some tingling above the right knee  EDEMA:  Resean notes swelling his left side but he gets off the right side before swelling is an issue  LOWER EXTREMITY ROM:  Active ROM Left/Right 02/04/2024   Hip flexion    Hip extension    Hip abduction    Hip adduction    Hip internal rotation    Hip external rotation    Knee flexion 140/118   Knee extension 0/0   Ankle dorsiflexion    Ankle plantarflexion    Ankle inversion    Ankle eversion     (Blank rows = not tested)  LOWER EXTREMITY STRENGTH:  Strength was assessed using circumferential thigh girth 15 cm proximal to the superior patellar pole Left/Right 02/04/2024 02/28/2024 Left/Right  Hip flexion    Hip extension    Hip abduction    Hip adduction    Hip internal rotation    Hip external rotation    Knee flexion    Knee extension 48.5 cm/47.0 cm 10-15% strength deficit per centimeter of atrophy 48.5 cm/47.0 cm 10-15% strength deficit per centimeter of atrophy  Ankle dorsiflexion    Ankle plantarflexion    Ankle inversion    Ankle eversion     (Blank rows = not tested)  GAIT: Evaluation on 02/04/2024: Distance walked: 50 feet Assistive device utilized: Environmental consultant - 4 wheeled Level of assistance: Complete Independence Comments: Kingjosiah notes he has increased knee pain immediately upon standing.  He is very limited in his standing and walking endurance as a result.  Prosthetic Wear & Care: 02/16/2024:  He is wearing prosthesis from arising to bedtime.   No skin issues noted.                                                                                                                             TREATMENT DATE:  03/24/2024 Quadriceps sets with pillow under knees 10 x 5 seconds Knee extension machine, most  of the weight on the left on the way up, then shift a bit more weight to the right  on the eccentrics 90-40 degrees with slow eccentrics 10 x with 10# and 10 x with 5# Seated straight leg raises 2 sets of 5 with PT light resistance and no prosthesis and 2 sets of 5 with prosthesis Seated knee extension with yellow Thera-Band (90-40 degrees and slow eccentrics) 2 sets of 10  Functional Activities: Yoga Bridge for bed mobility 2 sets of 10 for 5 seconds Step-down off 4 inch step (slow eccentrics) 2 sets of 10   03/13/2024 Therapeutic Exercise: Nustep seat 15 Level 7 with BLEs & BUEs for 4  min, then level 5 BLEs only 4 min Seated quad set 5 sec hold 10 reps.  Seated small range SLR 10 reps Eccentric LAQ 10 reps 2 sets  Neuromuscular Re-education: Step forward (heel past contralateral toes) with 2.5" between midfoot - 30 sec static RLE in front & in back 2 reps ea with intermittent touch.  PT manual resistance for quad set/hamstring set/quad set at various knee positions to facilitate co-contraction knee control.   Therapeutic Activities: 2" step up, over & step down with ea LE (contralateral LE stepping from terminal stance to initial contact position stepping over foam beam) BUE support //bars 6 reps.    TREATMENT DATE:  03/08/2024 Quadriceps sets supine with pillow below knees, pushed a bit higher up under the distal thigh (ask Daxon if it is comfortable), no prosthesis 2 sets 10 x 5 seconds Seated straight leg raises 3 sets of 5 bilateral (no prosthesis) Seated knee extension isometrics with PT seated at the edge of the treatment table 2 sets of 10 for 5 seconds Discussed how this could be done with Thera-Band at home, will do again on Sherley's next visit  Neuromuscular re-education: Wide tandem balance 3 x 20 seconds bilateral (encourage slight knee flexion).  Needed breaks due to fatigue.  Functional Activities: Step-down from 4 to 2 inch step with hands and slow eccentrics 10 x   PATIENT EDUCATION:  Education details: See above Person educated:  Patient Education method: Explanation, Demonstration, Tactile cues, Verbal cues, and Handouts Education comprehension: verbalized understanding, returned demonstration, verbal cues required, tactile cues required, and needs further education  HOME EXERCISE PROGRAM: Access Code: [email or web address] URL: [email or web address] Date: 03/24/2024 Prepared by: Terral Ferrari  Exercises - Supine Quadricep Sets  - 5 x daily - 7 x weekly - 2 sets - 10 reps - 5 second hold - Seated Long Arc Quad  - 1 x daily - 7 x weekly - 2-3 sets - 10 reps - 2-3 seconds hold - Standing Hip Abduction Alternating legs  - 1 x daily - 4 x weekly - 2 sets - 10 reps - 5 seconds hold - Alternating Hip Flexion Forward Kicks  - 1 x daily - 4 x weekly - 2 sets - 10 reps - 5 seconds hold - Standing Hip Extension with Counter Support  - 1 x daily - 4 x weekly - 2 sets - 10 reps - 5 seconds hold - Seated Straight Leg Raise  - 2 x daily - 7 x weekly - 3-4 sets - 5 reps - 3 seconds hold - Yoga Bridge  - 2 x daily - 7 x weekly - 1-2 sets - 10 reps - 5 seconds hold - Seated Straight Leg Raise  - 3 x daily - 7 x weekly - 2-3 sets - 5 reps - 3 seconds hold  ASSESSMENT:  CLINICAL IMPRESSION: Osmani did a great job with his quadriceps strengthening focus today.  We tried to progress some activities to help progress along with his home exercises including resisted knee extension with the Thera-Band in the 90 to 40 degree range to avoid patellofemoral symptoms while still strengthening quadriceps.  Selman does a great job with his compliance and effort in the clinic and we are still working to get a program at home which will give him the same results as he seems to get in the office.   Evaluation on 02/04/2024:  Patient is a 53 y.o. male who was seen today for physical therapy evaluation and treatment for M25.561,G89.29 (ICD-10-CM) - Chronic pain of right knee.  Demiko had a fall in November 2024 that resulted in a hip fracture.  His knee  pain has been present since that time.  It is very likely that Drequan's right knee pain is related to the decrease in activity, strength and atrophy related to this fall and fracture.  Manual palpation of quadriceps sets shows significantly decreased right quadriceps activation as compared to the left and significant atrophy is noted of the right thigh.  Tayvian was started on an appropriate home exercise program to improve quadricep strength, knee pain and allow him to improve his standing and walking endurance.  Given the 69-month history of this problem, and because of his other medical issues including severe diabetes and a below-knee amputation, Janie may require a longer than normal course of rehabilitation.  OBJECTIVE IMPAIRMENTS: Abnormal gait, cardiopulmonary status limiting activity, decreased activity tolerance, decreased balance, decreased endurance, decreased knowledge of condition, difficulty walking, decreased ROM, decreased strength, increased edema, impaired perceived functional ability, and pain.   ACTIVITY LIMITATIONS: carrying, bending, standing, squatting, stairs, transfers, and locomotion level  PARTICIPATION LIMITATIONS: community activity  PERSONAL FACTORS: Type 2 diabetes, HLD, HTN, peripheral artery disease, Rt below knee amputation, left toe amputation, end stage renal disease, right hip fracture in 09/2023 are also affecting patient's functional outcome.   REHAB POTENTIAL: Good  CLINICAL DECISION MAKING: Stable/uncomplicated  EVALUATION COMPLEXITY: Moderate   GOALS: Goals reviewed with patient? Yes  SHORT TERM GOALS: Target date: 03/03/2024 Daeron will be independent with his day 1 home exercise program Baseline: Started 02/04/2024 Goal status: MET  02/28/2024  2.  Improve right quadriceps strength as assessed by improved quadriceps activation as assessed manually by the physical therapist during a quadriceps set exercise Baseline: Poor activation at evaluation Goal  status: MET 02/28/2024   LONG TERM GOALS: Target date: 03/31/2024  Improve patient specific functional scale to at least 50% for walking in home & community and picking up items Baseline: 0% Goal status: Ongoing 03/13/2024  2.  Jerius will report right knee pain no greater than 3 out of 10 on the visual analog scale Baseline: As high as 10 out of 10 Goal status: Ongoing 03/24/2024  3.  Valton will be able to complete a set of 10 consecutive seated straight leg raises, demonstrating improved quadriceps strength Baseline: Unable at evaluation Goal status: Met 03/24/2024  4.  Gery will report improved standing and walking endurance as compared to evaluation Baseline: Significantly increased right knee pain immediately upon standing which severely limits standing and walking endurance Goal status: Ongoing 03/24/2024  5.  Kathan will be independent with his long-term maintenance home exercise program Baseline: Started 02/04/2024 Goal status: Ongoing 03/24/2024  PLAN:  PT FREQUENCY: 1-2x/week  PT DURATION: 4 additional weeks  PLANNED INTERVENTIONS: 97110-Therapeutic exercises, 97530- Therapeutic activity, 97112-  Neuromuscular re-education, [phone]- Self Care, [id number]- Manual therapy, [id number]- Gait training, [phone]- Prosthetic training, [phone]- Vasopneumatic device, Patient/Family education, Balance training, Stair training, and Cryotherapy  PLAN FOR NEXT SESSION: Continue with quadriceps strengthening, balance, appropriate functional progressions to improve weight-bearing endurance and function.  Joli Neas, PT, MPT 03/24/2024, 4:14 PM

## 2024-03-25 DIAGNOSIS — Z992 Dependence on renal dialysis: Secondary | ICD-10-CM | POA: Diagnosis not present

## 2024-03-25 DIAGNOSIS — D689 Coagulation defect, unspecified: Secondary | ICD-10-CM | POA: Diagnosis not present

## 2024-03-25 DIAGNOSIS — N2581 Secondary hyperparathyroidism of renal origin: Secondary | ICD-10-CM | POA: Diagnosis not present

## 2024-03-25 DIAGNOSIS — T8249XA Other complication of vascular dialysis catheter, initial encounter: Secondary | ICD-10-CM | POA: Diagnosis not present

## 2024-03-25 DIAGNOSIS — D509 Iron deficiency anemia, unspecified: Secondary | ICD-10-CM | POA: Diagnosis not present

## 2024-03-25 DIAGNOSIS — N186 End stage renal disease: Secondary | ICD-10-CM | POA: Diagnosis not present

## 2024-03-25 DIAGNOSIS — E1122 Type 2 diabetes mellitus with diabetic chronic kidney disease: Secondary | ICD-10-CM | POA: Diagnosis not present

## 2024-03-25 DIAGNOSIS — D631 Anemia in chronic kidney disease: Secondary | ICD-10-CM | POA: Diagnosis not present

## 2024-03-26 DIAGNOSIS — N189 Chronic kidney disease, unspecified: Secondary | ICD-10-CM | POA: Diagnosis not present

## 2024-03-26 DIAGNOSIS — E1122 Type 2 diabetes mellitus with diabetic chronic kidney disease: Secondary | ICD-10-CM | POA: Diagnosis not present

## 2024-03-26 DIAGNOSIS — J129 Viral pneumonia, unspecified: Secondary | ICD-10-CM | POA: Diagnosis not present

## 2024-03-26 DIAGNOSIS — F17210 Nicotine dependence, cigarettes, uncomplicated: Secondary | ICD-10-CM | POA: Diagnosis not present

## 2024-03-26 DIAGNOSIS — R059 Cough, unspecified: Secondary | ICD-10-CM | POA: Diagnosis not present

## 2024-03-26 DIAGNOSIS — J9811 Atelectasis: Secondary | ICD-10-CM | POA: Diagnosis not present

## 2024-03-26 DIAGNOSIS — Z992 Dependence on renal dialysis: Secondary | ICD-10-CM | POA: Diagnosis not present

## 2024-03-26 DIAGNOSIS — I129 Hypertensive chronic kidney disease with stage 1 through stage 4 chronic kidney disease, or unspecified chronic kidney disease: Secondary | ICD-10-CM | POA: Diagnosis not present

## 2024-03-26 DIAGNOSIS — E1140 Type 2 diabetes mellitus with diabetic neuropathy, unspecified: Secondary | ICD-10-CM | POA: Diagnosis not present

## 2024-03-26 DIAGNOSIS — J811 Chronic pulmonary edema: Secondary | ICD-10-CM | POA: Diagnosis not present

## 2024-03-26 DIAGNOSIS — J90 Pleural effusion, not elsewhere classified: Secondary | ICD-10-CM | POA: Diagnosis not present

## 2024-03-26 DIAGNOSIS — Z7984 Long term (current) use of oral hypoglycemic drugs: Secondary | ICD-10-CM | POA: Diagnosis not present

## 2024-03-27 ENCOUNTER — Telehealth: Payer: Self-pay | Admitting: Physical Therapy

## 2024-03-27 ENCOUNTER — Ambulatory Visit (INDEPENDENT_AMBULATORY_CARE_PROVIDER_SITE_OTHER): Admitting: Physical Therapy

## 2024-03-27 ENCOUNTER — Telehealth: Payer: Self-pay

## 2024-03-27 ENCOUNTER — Encounter: Admitting: Physical Therapy

## 2024-03-27 ENCOUNTER — Encounter: Payer: Self-pay | Admitting: Physical Therapy

## 2024-03-27 DIAGNOSIS — M6281 Muscle weakness (generalized): Secondary | ICD-10-CM | POA: Diagnosis not present

## 2024-03-27 DIAGNOSIS — R296 Repeated falls: Secondary | ICD-10-CM

## 2024-03-27 DIAGNOSIS — G8929 Other chronic pain: Secondary | ICD-10-CM

## 2024-03-27 DIAGNOSIS — R262 Difficulty in walking, not elsewhere classified: Secondary | ICD-10-CM | POA: Diagnosis not present

## 2024-03-27 DIAGNOSIS — R269 Unspecified abnormalities of gait and mobility: Secondary | ICD-10-CM | POA: Diagnosis not present

## 2024-03-27 DIAGNOSIS — R2681 Unsteadiness on feet: Secondary | ICD-10-CM

## 2024-03-27 DIAGNOSIS — M25561 Pain in right knee: Secondary | ICD-10-CM

## 2024-03-27 NOTE — Therapy (Deleted)
 OUTPATIENT PHYSICAL THERAPY LOWER EXTREMITY TREATMENT   Patient Name: [name] [name] MRN: [id number] DOB:[birth year], 53 y.o., male Today's Date: 03/27/2024  END OF SESSION:        Past Medical History:  Diagnosis Date   Abdominal pain 11/30/2023   Abdominal tenderness 10/14/2023   Allergy    seasonal and environmental   Anemia    Chest pain 12/01/2023   Chronic constipation 05/13/2020   Depression    Diabetes mellitus without complication (HCC) 2019   Dyspnea    hx - SOB with exertion   ESRD on hemodialysis (HCC)    Tues, Thurs, Sat   GERD (gastroesophageal reflux disease)    Hyperlipidemia    Hypertension    Necrotizing fasciitis (HCC) 10/13/2017   Neuromuscular disorder (HCC)    neuropathy   PAD (peripheral artery disease) (HCC)    Secondary hyperparathyroidism (HCC)    SVT (supraventricular tachycardia) (HCC)    hx   Wound dehiscence 11/24/2017   Past Surgical History:  Procedure Laterality Date   A/V FISTULAGRAM N/A 03/17/2024   Procedure: A/V Fistulagram;  Surgeon: [name] Che, MD;  Location: [facility] INV[facility];  Service: Cardiovascular;  Laterality: N/A;   AMPUTATION Right 10/13/2017   Procedure: RIGHT BELOW KNEE AMPUTATION;  Surgeon: [name] Ford, MD;  Location: [facility] OR;  Service: Orthopedics;  Laterality: Right;   AMPUTATION Right 11/24/2017   Procedure: AMPUTATION BELOW KNEE REVISION;  Surgeon: [name] Ford, MD;  Location: [facility] OR;  Service: Orthopedics;  Laterality: Right;   AMPUTATION TOE Left    AV FISTULA PLACEMENT Left 10/19/2023   Procedure: LEFT BRACHIOCEPHALIC ARTERIOVENOUS (AV) FISTULA CREATION;  Surgeon: [name] Brawn, MD;  Location: [facility] OR;  Service: Vascular;  Laterality: Left;   AV FISTULA PLACEMENT Left 01/26/2024   Procedure: LEFT ARTERIOVENOUS (AV) FISTULA CREATION;  Surgeon: [name] Brawn, MD;  Location: [facility] OR;  Service: Vascular;  Laterality: Left;   BIOPSY  09/02/2021   Procedure: BIOPSY;  Surgeon: [name] Drum, MD;   Location: [facility] ENDOSCOPY;  Service: Gastroenterology;;   BIOPSY  02/18/2023   Procedure: BIOPSY;  Surgeon: [name] Holder, MD;  Location: [facility] ENDOSCOPY;  Service: Gastroenterology;;   COLONOSCOPY  05/11/2019   COLONOSCOPY  2021   3 polyps - tubular adenomas   ESOPHAGOGASTRODUODENOSCOPY (EGD) WITH PROPOFOL  N/A 09/02/2021   Procedure: ESOPHAGOGASTRODUODENOSCOPY (EGD) WITH PROPOFOL ;  Surgeon: [name] Drum, MD;  Location: [facility] ENDOSCOPY;  Service: Gastroenterology;  Laterality: N/A;   ESOPHAGOGASTRODUODENOSCOPY (EGD) WITH PROPOFOL  N/A 02/18/2023   Procedure: ESOPHAGOGASTRODUODENOSCOPY (EGD) WITH PROPOFOL ;  Surgeon: [name] Holder, MD;  Location: [facility] ENDOSCOPY;  Service: Gastroenterology;  Laterality: N/A;   INTRAMEDULLARY (IM) NAIL INTERTROCHANTERIC Right 10/15/2023   Procedure: INTRAMEDULLARY (IM) NAIL INTERTROCHANTERIC;  Surgeon: [name] Pintos, MD;  Location: [facility] OR;  Service: Orthopedics;  Laterality: Right;   IR FLUORO GUIDE CV LINE RIGHT  10/15/2023   IR US  GUIDE VASC ACCESS RIGHT  10/15/2023   POLYPECTOMY     WISDOM TOOTH EXTRACTION     Patient Active Problem List   Diagnosis Date Noted   Nausea and vomiting 01/26/2024   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/25/2024   Depression 01/24/2024   Hypertensive urgency 01/24/2024   Hypotension 12/01/2023   Altered mental status 11/30/2023   ESRD (end stage renal disease) on dialysis (HCC) 11/30/2023   Right knee pain 10/24/2023   Nonimmune to hepatitis B virus 10/22/2023   Closed fracture of right hip (HCC) 10/15/2023   T2DM (type 2 diabetes mellitus) (HCC)  10/14/2023   Urinary retention 10/14/2023   Closed intertrochanteric fracture of right hip (HCC) 10/13/2023   Patellar tendinitis 10/12/2023   Wrist sprain, left, initial encounter 10/12/2023   Contracture, 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   Mucosal abnormality of esophagus 02/18/2023   Foot pain, left 02/17/2023   SOB (shortness of breath) 02/16/2023   History of esophagitis 02/16/2023    Tachycardia 02/15/2023   Allergic rhinitis 11/07/2022   Chronic kidney disease, stage 5 (HCC) 09/11/2022   Tobacco dependence 09/11/2022   GERD with esophagitis 09/02/2021   Acute kidney injury superimposed on CKD (HCC) 04/10/2021   Achilles tendon contracture, left 03/03/2018   Non-pressure chronic ulcer of other part of left foot limited to breakdown of skin (HCC) 02/02/2018   History of right below knee amputation (HCC) 12/22/2017   Acute posthemorrhagic anemia    Falls 11/24/2017   Severe hypertension 11/24/2017   Anemia 11/24/2017   Uncontrolled type 2 diabetes mellitus with hyperglycemia, with long-term current use of insulin  (HCC) 10/11/2017   ARF (acute renal failure) (HCC) 10/11/2017    PCP: [name] Kidd, MD  REFERRING PROVIDER: Timothy Ford, MD  REFERRING DIAG: [phone] (ICD-10-CM) - Chronic pain of right knee  THERAPY DIAG:  No diagnosis found.  Rationale for Evaluation and Treatment: Rehabilitation  ONSET DATE: 09/2023   SUBJECTIVE:   SUBJECTIVE STATEMENT: ***  [name] [name] reports good HEP compliance.  He is still very week with his thighs bilaterally (Rt more so than left).   PERTINENT HISTORY: Type 2 diabetes, HLD, HTN, peripheral artery disease, Rt below knee amputation, left toe amputation, end stage renal disease, right hip fracture in 09/2023  PAIN:  Are you having pain? Yes: NPRS scale: Unchanged Pain location: Supero-medial right knee and above the knee cap Pain description: Sharp, worsens with increased weight-bearing Aggravating factors: Standing and walking, stand to sit transition, knee flexion and hip flexion Relieving factors: Getting off the knee  PRECAUTIONS:  NO BP LUE, fall risk  RED FLAGS: None   WEIGHT BEARING RESTRICTIONS:  [name] [name] notes increased WB increases symptoms  FALLS:  Has patient fallen in last 6 months?  Yes, cause of initial injury but no falls since that time.  LIVING ENVIRONMENT: Lives with: lives with their  family Lives in: Mobile home Stairs:  Needs handrails Has following equipment at home: Single point cane, Environmental consultant - 4 wheeled, Wheelchair (manual), shower chair, and shower bars  OCCUPATION: Disability, drive, racing  PLOF:  Had 3 falls previous before the fall that resulted in a fracture   PATIENT GOALS: Be able to walk safely without the walker and with less knee pain  NEXT MD VISIT: 04/03/2024  OBJECTIVE:  Note: Objective measures were completed at Evaluation unless otherwise noted.  DIAGNOSTIC FINDINGS: 1. No meniscal or ligamentous injury of the right knee. 2. Mild osseous contusion of the posteromedial tibial plateau. 3. Severe edema in infrapatellar Hoffa's fat likely reflecting a contusion secondary to direct trauma.  PATIENT SURVEYS:  Patient-Specific Activity Scoring Scheme  "0" represents "unable to perform." "10" represents "able to perform at prior level. 0 1 2 3 4 5 6 7 8 9  10 (Date and Score)   Activity 02/16/2024  03/06/2024  1. Walking with cane in community  0  0  2. Walking in house without device  0  0  3. Picking up items from floor 0 0  4.    5.    Score 0 0   Total score = sum of the activity scores/number of activities  Minimum detectable change (90%CI) for average score = 2 points Minimum detectable change (90%CI) for single activity score = 3 points    COGNITION: Overall cognitive status: Within functional limits for tasks assessed     SENSATION: Diabetic neuropathy of the left foot and some tingling above the right knee  EDEMA:  Mia notes swelling his left side but he gets off the right side before swelling is an issue  LOWER EXTREMITY ROM:  Active ROM Left/Right 02/04/2024   Hip flexion    Hip extension    Hip abduction    Hip adduction    Hip internal rotation    Hip external rotation    Knee flexion 140/118   Knee extension 0/0   Ankle dorsiflexion    Ankle plantarflexion    Ankle inversion    Ankle eversion     (Blank rows =  not tested)  LOWER EXTREMITY STRENGTH:  Strength was assessed using circumferential thigh girth 15 cm proximal to the superior patellar pole Left/Right 02/04/2024 02/28/2024 Left/Right  Hip flexion    Hip extension    Hip abduction    Hip adduction    Hip internal rotation    Hip external rotation    Knee flexion    Knee extension 48.5 cm/47.0 cm 10-15% strength deficit per centimeter of atrophy 48.5 cm/47.0 cm 10-15% strength deficit per centimeter of atrophy  Ankle dorsiflexion    Ankle plantarflexion    Ankle inversion    Ankle eversion     (Blank rows = not tested)  GAIT: Evaluation on 02/04/2024: Distance walked: 50 feet Assistive device utilized: Environmental consultant - 4 wheeled Level of assistance: Complete Independence Comments: Zylan notes he has increased knee pain immediately upon standing.  He is very limited in his standing and walking endurance as a result.  Prosthetic Wear & Care: 02/16/2024:  He is wearing prosthesis from arising to bedtime.   No skin issues noted.                                                                                                                             TREATMENT DATE:  03/27/2024 ***   TREATMENT DATE:  03/24/2024 Quadriceps sets with pillow under knees 10 x 5 seconds Knee extension machine, most of the weight on the left on the way up, then shift a bit more weight to the right on the eccentrics 90-40 degrees with slow eccentrics 10 x with 10# and 10 x with 5# Seated straight leg raises 2 sets of 5 with PT light resistance and no prosthesis and 2 sets of 5 with prosthesis Seated knee extension with yellow Thera-Band (90-40 degrees and slow eccentrics) 2 sets of 10  Functional Activities: Yoga Bridge for bed mobility 2 sets of 10 for 5 seconds Step-down off 4 inch step (slow eccentrics) 2 sets of 10   03/13/2024 Therapeutic Exercise: Nustep seat 15 Level 7 with BLEs & BUEs for 4  min, then level 5  BLEs only 4 min Seated quad set 5 sec  hold 10 reps.  Seated small range SLR 10 reps Eccentric LAQ 10 reps 2 sets  Neuromuscular Re-education: Step forward (heel past contralateral toes) with 2.5" between midfoot - 30 sec static RLE in front & in back 2 reps ea with intermittent touch.  PT manual resistance for quad set/hamstring set/quad set at various knee positions to facilitate co-contraction knee control.   Therapeutic Activities: 2" step up, over & step down with ea LE (contralateral LE stepping from terminal stance to initial contact position stepping over foam beam) BUE support //bars 6 reps.     PATIENT EDUCATION:  Education details: See above Person educated: Patient Education method: Explanation, Demonstration, Tactile cues, Verbal cues, and Handouts Education comprehension: verbalized understanding, returned demonstration, verbal cues required, tactile cues required, and needs further education  HOME EXERCISE PROGRAM: Access Code: [email or web address] URL: [email or web address] Date: 03/24/2024 Prepared by: Terral Ferrari  Exercises - Supine Quadricep Sets  - 5 x daily - 7 x weekly - 2 sets - 10 reps - 5 second hold - Seated Long Arc Quad  - 1 x daily - 7 x weekly - 2-3 sets - 10 reps - 2-3 seconds hold - Standing Hip Abduction Alternating legs  - 1 x daily - 4 x weekly - 2 sets - 10 reps - 5 seconds hold - Alternating Hip Flexion Forward Kicks  - 1 x daily - 4 x weekly - 2 sets - 10 reps - 5 seconds hold - Standing Hip Extension with Counter Support  - 1 x daily - 4 x weekly - 2 sets - 10 reps - 5 seconds hold - Seated Straight Leg Raise  - 2 x daily - 7 x weekly - 3-4 sets - 5 reps - 3 seconds hold - Yoga Bridge  - 2 x daily - 7 x weekly - 1-2 sets - 10 reps - 5 seconds hold - Seated Straight Leg Raise  - 3 x daily - 7 x weekly - 2-3 sets - 5 reps - 3 seconds hold  ASSESSMENT:  CLINICAL IMPRESSION: ***   Vida did a great job with his quadriceps strengthening focus today.  We tried to progress some  activities to help progress along with his home exercises including resisted knee extension with the Thera-Band in the 90 to 40 degree range to avoid patellofemoral symptoms while still strengthening quadriceps.  Armando does a great job with his compliance and effort in the clinic and we are still working to get a program at home which will give him the same results as he seems to get in the office.   Evaluation on 02/04/2024:  Patient is a 53 y.o. male who was seen today for physical therapy evaluation and treatment for M25.561,G89.29 (ICD-10-CM) - Chronic pain of right knee.  Kentley had a fall in November 2024 that resulted in a hip fracture.  His knee pain has been present since that time.  It is very likely that Moritz's right knee pain is related to the decrease in activity, strength and atrophy related to this fall and fracture.  Manual palpation of quadriceps sets shows significantly decreased right quadriceps activation as compared to the left and significant atrophy is noted of the right thigh.  Stedman was started on an appropriate home exercise program to improve quadricep strength, knee pain and allow him to improve his standing and walking endurance.  Given the 9-month history of this problem, and because of his other medical  issues including severe diabetes and a below-knee amputation, Janie may require a longer than normal course of rehabilitation.  OBJECTIVE IMPAIRMENTS: Abnormal gait, cardiopulmonary status limiting activity, decreased activity tolerance, decreased balance, decreased endurance, decreased knowledge of condition, difficulty walking, decreased ROM, decreased strength, increased edema, impaired perceived functional ability, and pain.   ACTIVITY LIMITATIONS: carrying, bending, standing, squatting, stairs, transfers, and locomotion level  PARTICIPATION LIMITATIONS: community activity  PERSONAL FACTORS: Type 2 diabetes, HLD, HTN, peripheral artery disease, Rt below knee amputation,  left toe amputation, end stage renal disease, right hip fracture in 09/2023 are also affecting patient's functional outcome.   REHAB POTENTIAL: Good  CLINICAL DECISION MAKING: Stable/uncomplicated  EVALUATION COMPLEXITY: Moderate   GOALS: Goals reviewed with patient? Yes  SHORT TERM GOALS: Target date: 03/03/2024 Karrson will be independent with his day 1 home exercise program Baseline: Started 02/04/2024 Goal status: MET  02/28/2024  2.  Improve right quadriceps strength as assessed by improved quadriceps activation as assessed manually by the physical therapist during a quadriceps set exercise Baseline: Poor activation at evaluation Goal status: MET 02/28/2024   LONG TERM GOALS: Target date: 03/31/2024  Improve patient specific functional scale to at least 50% for walking in home & community and picking up items Baseline: 0% Goal status: Ongoing 03/13/2024  2.  Kylenn will report right knee pain no greater than 3 out of 10 on the visual analog scale Baseline: As high as 10 out of 10 Goal status: Ongoing 03/24/2024  3.  Travares will be able to complete a set of 10 consecutive seated straight leg raises, demonstrating improved quadriceps strength Baseline: Unable at evaluation Goal status: Met 03/24/2024  4.  Latravion will report improved standing and walking endurance as compared to evaluation Baseline: Significantly increased right knee pain immediately upon standing which severely limits standing and walking endurance Goal status: Ongoing 03/24/2024  5.  Damaris will be independent with his long-term maintenance home exercise program Baseline: Started 02/04/2024 Goal status: Ongoing 03/24/2024  PLAN:  PT FREQUENCY: 1-2x/week  PT DURATION: 4 additional weeks  PLANNED INTERVENTIONS: 97110-Therapeutic exercises, 97530- Therapeutic activity, 97112- Neuromuscular re-education, 97535- Self Care, 95621- Manual therapy, [id number]- Gait training, [phone]- Prosthetic training, [phone]- Vasopneumatic device,  Patient/Family education, Balance training, Stair training, and Cryotherapy  PLAN FOR NEXT SESSION: ***   Continue with quadriceps strengthening, balance, appropriate functional progressions to improve weight-bearing endurance and function.  Rande Roylance, PT, DPT 03/27/2024, 7:40 AM

## 2024-03-27 NOTE — Telephone Encounter (Signed)
 PT called patient re: no-show appt.  PT offered opening from this afternoon.

## 2024-03-27 NOTE — Transitions of Care (Post Inpatient/ED Visit) (Signed)
 03/27/2024  Name: [name] [name] MRN: [id number] DOB: 05/15/1971  Today's TOC FU Call Status: Today's TOC FU Call Status:: Successful TOC FU Call Completed TOC FU Call Complete Date: 03/27/24 Patient's Name and Date of Birth confirmed.  Transition Care Management Follow-up Telephone Call Date of Discharge: 03/26/24 Discharge Facility: Other ([facility]) Name of Other ([facility]) Discharge Facility: [facility] Type of Discharge: Emergency Department Reason for ED Visit: Respiratory Respiratory Diagnosis: Pnuemonia How have you been since you were released from the hospital?: Better Any questions or concerns?: No  Items Reviewed: Did you receive and understand the discharge instructions provided?: Yes Medications obtained,verified, and reconciled?: Yes (Medications Reviewed) Any new allergies since your discharge?: No Dietary orders reviewed?: Yes Do you have support at home?: Yes People in Home [RPT]: parent(s), sibling(s)  Medications Reviewed Today: Medications Reviewed Today     Reviewed by Darrall Ellison, LPN (Licensed Practical Nurse) on 03/27/24 at 1615  Med List Status: <None>   Medication Order Taking? Sig Documenting Provider Last Dose Status Informant  Accu-Chek Softclix Lancets lancets [id number]  Use to check blood sugar twice per day. E11.9 Genora Kidd, MD  Active   acetaminophen  (TYLENOL ) 500 MG tablet [id number] No Take 2 tablets (1,000 mg total) by mouth every 6 (six) hours as needed.  Patient not taking: Reported on 03/15/2024   Farris Hong, MD Not Taking Active Self  amoxicillin -clavulanate (AUGMENTIN ) 250-125 MG tablet [id number]  Take 1 tablet by mouth daily for 10 days. Ernestina Headland, MD  Active   atorvastatin  (LIPITOR) 40 MG tablet [id number] No Take 1 tablet (40 mg total) by mouth at bedtime. Genora Kidd, MD 03/16/2024 Active Self  Blood Glucose Monitoring Suppl (ACCU-CHEK GUIDE ME) w/Device KIT [id number] No See admin instructions.  [provider] Taking Active Self  calcitRIOL  (ROCALTROL ) 0.25 MCG capsule [id number] No Take 1 capsule (0.25 mcg total) by mouth Every Tuesday,Thursday,and Saturday with dialysis. Derril Flint Mahnoor, MD Past Week Active Self           Med Note Carylon Claude, JEANNETTA   Wed Mar 15, 2024 11:02 AM) Given at dialysis  Darbepoetin Alfa  (ARANESP ) 150 MCG/0.3ML SOSY injection [id number] No Inject 0.3 mLs (150 mcg total) into the skin every Saturday at 6 PM.  Patient taking differently: Inject 150 mcg into the skin every Saturday at 6 PM. At dialysis   Farris Hong, MD Past Week Active Self           Med Note Carylon Claude, JEANNETTA   Wed Mar 15, 2024 11:03 AM)    diclofenac  Sodium (VOLTAREN ) 1 % GEL [id number] No Apply 4 g topically 4 (four) times daily.  Patient taking differently: Apply 4 g topically 4 (four) times daily as needed (pain).   Genora Kidd, MD Taking Active Self  diltiazem  (CARDIZEM  CD) 300 MG 24 hr capsule [id number] No TAKE 1 CAPSULE(300 MG) BY MOUTH DAILY Jagadish, Mayuri, MD 03/16/2024 Active Self           Med Note (HORTON, JEANNETTA   Wed Mar 15, 2024 11:00 AM) Bedtime  doxycycline  (VIBRA -TABS) 100 MG tablet [id number]  Take 1 tablet (100 mg total) by mouth 2 (two) times daily for 7 days. Ernestina Headland, MD  Active   FLUoxetine  (PROZAC ) 20 MG capsule [id number] No TAKE 1 CAPSULE(20 MG) BY MOUTH DAILY Genora Kidd, MD 03/16/2024 Active Self  gabapentin  (NEURONTIN ) 300 MG capsule [id number] No TAKE 1 CAPSULE(300 MG) BY MOUTH DAILY Genora Kidd, MD 03/16/2024 Active Self  glucose blood (AGAMATRIX PRESTO TEST) test strip [id number]  No Check blood sugars twice daily before meals Ronalee Cocking, MD Taking Active Self  glucose blood test strip [id number] No 1 each by Other route as needed for other. Use as instructed [provider] Taking Active Self  hydrALAZINE  (APRESOLINE ) 50 MG tablet [id number] No Take 1 tablet (50 mg total) by mouth 2 (two) times daily.  Patient  taking differently: Take 100 mg by mouth 2 (two) times daily.   Derril Flint Mahnoor, MD 03/16/2024 Active Self  Insulin  Disposable Pump (OMNIPOD DASH PODS, GEN 4,) MISC [id number]  Not all the time [provider]  Active Self  insulin  lispro (HUMALOG ) 100 UNIT/ML injection [id number] No 0-6 Units, Subcutaneous, 3 times daily with meals, First dose on Sun 10/17/23 at 0800 Correction coverage: Very Sensitive (ESRD/Dialysis) CBG < 70: Implement Hypoglycemia Standing Orders and refer to Hypoglycemia Standing Orders sidebar report CBG 70 - 120: 0 units CBG 121 - 150: 0 units CBG 151 - 200: 1 unit CBG 201-250: 2 units CBG 251-300: 3 units CBG 301-350: 4 units CBG 351-400: 5 units CBG > 400: Give 6 units and call MD  Patient taking differently: 0-6 Units, Subcutaneous, 3 times daily with meals, as needed  Correction coverage: Very Sensitive (ESRD/Dialysis) CBG < 70: Implement Hypoglycemia Standing Orders and refer to Hypoglycemia Standing Orders sidebar report CBG 70 - 120: 0 units CBG 121 - 150: 0 units CBG 151 - 200: 1 unit CBG 201-250: 2 units CBG 251-300: 3 units CBG 301-350: 4 units CBG 351-400: 5 units CBG > 400: Give 6 units and call MD   Derril Flint, Mahnoor, MD 03/16/2024 Active Self  lidocaine  (HM LIDOCAINE  PATCH) 4 % [id number] No Place 1 patch onto the skin daily.  Patient not taking: Reported on 03/15/2024   Genora Kidd, MD Not Taking Active Self  losartan  (COZAAR ) 25 MG tablet [id number] No Take 1 tablet (25 mg total) by mouth daily. Genora Kidd, MD 03/16/2024 Active Self  metoprolol  succinate (TOPROL -XL) 25 MG 24 hr tablet [id number] No Take 25 mg by mouth daily. [provider] 03/16/2024 Active Self  ondansetron  (ZOFRAN -ODT) 4 MG disintegrating tablet [id number] No Take 1 tablet (4 mg total) by mouth every 8 (eight) hours as needed for nausea or vomiting. Genora Kidd, MD Past Month Active Self  oxyCODONE -acetaminophen  (PERCOCET/ROXICET) 5-325 MG tablet  [id number] No Take 1 tablet by mouth 3 (three) times daily as needed. [provider] 03/16/2024 Active Self  pantoprazole  (PROTONIX ) 40 MG tablet [id number] No Take 1 tablet (40 mg total) by mouth daily. Genora Kidd, MD 03/16/2024 Active Self  sevelamer  carbonate (RENVELA ) 800 MG tablet [id number] No Take 2 tablets (1,600 mg total) by mouth 3 (three) times daily with meals.  Patient not taking: Reported on 03/15/2024   Farris Hong, MD Not Taking Active Self  tamsulosin  (FLOMAX ) 0.4 MG CAPS capsule [id number] No TAKE 1 CAPSULE(0.4 MG) BY MOUTH DAILY AFTER SUPPER Jagadish, Mayuri, MD More than a month Active Self  tiZANidine  (ZANAFLEX ) 2 MG tablet [id number]  Take 1 tablet (2 mg total) by mouth 2 (two) times daily as needed for muscle spasms. Genora Kidd, MD  Active             Home Care and Equipment/Supplies: Were Home Health Services Ordered?: NA Any new equipment or medical supplies ordered?: NA  Functional Questionnaire: Do you need assistance with bathing/showering or dressing?: No Do you need assistance with meal preparation?: No Do you need assistance with eating?: No Do you have difficulty maintaining continence: No Do you need  assistance with getting out of bed/getting out of a chair/moving?: No Do you have difficulty managing or taking your medications?: No  Follow up appointments reviewed: PCP Follow-up appointment confirmed?: Yes Date of PCP follow-up appointment?: 03/31/24 Follow-up Provider: [facility] Follow-up appointment confirmed?: NA Do you need transportation to your follow-up appointment?: No Do you understand care options if your condition(s) worsen?: Yes-patient verbalized understanding    SIGNATURE Darrall Ellison, LPN [facility] Nurse Health Advisor Direct Dial [phone]

## 2024-03-27 NOTE — Therapy (Signed)
 OUTPATIENT PHYSICAL THERAPY LOWER EXTREMITY TREATMENT   Patient Name: [name] [name] MRN: [id number] DOB:07/05/1971, 53 y.o., male Today's Date: 03/27/2024  END OF SESSION:  PT End of Session - 03/27/24 1304     Visit Number 11    Number of Visits 16    Date for PT Re-Evaluation 03/31/24    Authorization Type [employer] dual complete no authorization needed  20% coinsurance    Progress Note Due on Visit 16    PT Start Time 1300    PT Stop Time 1339    PT Time Calculation (min) 39 min    Activity Tolerance Patient tolerated treatment well;No increased pain;Patient limited by pain    Behavior During Therapy [facility] for tasks assessed/performed                  Past Medical History:  Diagnosis Date   Abdominal pain 11/30/2023   Abdominal tenderness 10/14/2023   Allergy    seasonal and environmental   Anemia    Chest pain 12/01/2023   Chronic constipation 05/13/2020   Depression    Diabetes mellitus without complication (HCC) 2019   Dyspnea    hx - SOB with exertion   ESRD on hemodialysis (HCC)    Tues, Thurs, Sat   GERD (gastroesophageal reflux disease)    Hyperlipidemia    Hypertension    Necrotizing fasciitis (HCC) 10/13/2017   Neuromuscular disorder (HCC)    neuropathy   PAD (peripheral artery disease) (HCC)    Secondary hyperparathyroidism (HCC)    SVT (supraventricular tachycardia) (HCC)    hx   Wound dehiscence 11/24/2017   Past Surgical History:  Procedure Laterality Date   A/V FISTULAGRAM N/A 03/17/2024   Procedure: A/V Fistulagram;  Surgeon: Carlene Che, MD;  Location: [facility] INVASIVE CV LAB;  Service: Cardiovascular;  Laterality: N/A;   AMPUTATION Right 10/13/2017   Procedure: RIGHT BELOW KNEE AMPUTATION;  Surgeon: Timothy Ford, MD;  Location: [facility] OR;  Service: Orthopedics;  Laterality: Right;   AMPUTATION Right 11/24/2017   Procedure: AMPUTATION BELOW KNEE REVISION;  Surgeon: Timothy Ford, MD;  Location: [facility] OR;  Service: Orthopedics;  Laterality:  Right;   AMPUTATION TOE Left    AV FISTULA PLACEMENT Left 10/19/2023   Procedure: LEFT BRACHIOCEPHALIC ARTERIOVENOUS (AV) FISTULA CREATION;  Surgeon: Philipp Brawn, MD;  Location: [facility] OR;  Service: Vascular;  Laterality: Left;   AV FISTULA PLACEMENT Left 01/26/2024   Procedure: LEFT ARTERIOVENOUS (AV) FISTULA CREATION;  Surgeon: Philipp Brawn, MD;  Location: [facility] OR;  Service: Vascular;  Laterality: Left;   BIOPSY  09/02/2021   Procedure: BIOPSY;  Surgeon: Daina Drum, MD;  Location: [facility] ENDOSCOPY;  Service: Gastroenterology;;   BIOPSY  02/18/2023   Procedure: BIOPSY;  Surgeon: Ace Holder, MD;  Location: [facility] ENDOSCOPY;  Service: Gastroenterology;;   COLONOSCOPY  05/11/2019   COLONOSCOPY  2021   3 polyps - tubular adenomas   ESOPHAGOGASTRODUODENOSCOPY (EGD) WITH PROPOFOL  N/A 09/02/2021   Procedure: ESOPHAGOGASTRODUODENOSCOPY (EGD) WITH PROPOFOL ;  Surgeon: Daina Drum, MD;  Location: [facility] ENDOSCOPY;  Service: Gastroenterology;  Laterality: N/A;   ESOPHAGOGASTRODUODENOSCOPY (EGD) WITH PROPOFOL  N/A 02/18/2023   Procedure: ESOPHAGOGASTRODUODENOSCOPY (EGD) WITH PROPOFOL ;  Surgeon: Ace Holder, MD;  Location: [facility] ENDOSCOPY;  Service: Gastroenterology;  Laterality: N/A;   INTRAMEDULLARY (IM) NAIL INTERTROCHANTERIC Right 10/15/2023   Procedure: INTRAMEDULLARY (IM) NAIL INTERTROCHANTERIC;  Surgeon: Laneta Pintos, MD;  Location: [facility] OR;  Service: Orthopedics;  Laterality: Right;   IR FLUORO GUIDE CV  LINE RIGHT  10/15/2023   IR US  GUIDE VASC ACCESS RIGHT  10/15/2023   POLYPECTOMY     WISDOM TOOTH EXTRACTION     Patient Active Problem List   Diagnosis Date Noted   Nausea and vomiting 01/26/2024   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/25/2024   Depression 01/24/2024   Hypertensive urgency 01/24/2024   Hypotension 12/01/2023   Altered mental status 11/30/2023   ESRD (end stage renal disease) on dialysis (HCC) 11/30/2023   Right knee pain 10/24/2023   Nonimmune to hepatitis B virus 10/22/2023    Closed fracture of right hip (HCC) 10/15/2023   T2DM (type 2 diabetes mellitus) (HCC) 10/14/2023   Urinary retention 10/14/2023   Closed intertrochanteric fracture of right hip (HCC) 10/13/2023   Patellar tendinitis 10/12/2023   Wrist sprain, left, initial encounter 10/12/2023   Contracture, 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   Mucosal abnormality of esophagus 02/18/2023   Foot pain, left 02/17/2023   SOB (shortness of breath) 02/16/2023   History of esophagitis 02/16/2023   Tachycardia 02/15/2023   Allergic rhinitis 11/07/2022   Chronic kidney disease, stage 5 (HCC) 09/11/2022   Tobacco dependence 09/11/2022   GERD with esophagitis 09/02/2021   Acute kidney injury superimposed on CKD (HCC) 04/10/2021   Achilles tendon contracture, left 03/03/2018   Non-pressure chronic ulcer of other part of left foot limited to breakdown of skin (HCC) 02/02/2018   History of right below knee amputation (HCC) 12/22/2017   Acute posthemorrhagic anemia    Falls 11/24/2017   Severe hypertension 11/24/2017   Anemia 11/24/2017   Uncontrolled type 2 diabetes mellitus with hyperglycemia, with long-term current use of insulin  (HCC) 10/11/2017   ARF (acute renal failure) (HCC) 10/11/2017    PCP: Genora Kidd, MD  REFERRING PROVIDER: Timothy Ford, MD  REFERRING DIAG: [phone] (ICD-10-CM) - Chronic pain of right knee  THERAPY DIAG:  Difficulty in walking, not elsewhere classified  Repeated falls  Unsteadiness on feet  Chronic pain of right knee  Muscle weakness (generalized)  Abnormality of gait and mobility  Rationale for Evaluation and Treatment: Rehabilitation  ONSET DATE: 09/2023   SUBJECTIVE:   SUBJECTIVE STATEMENT: Patient reports that his knee is feeling "a little better."   PERTINENT HISTORY: Type 2 diabetes, HLD, HTN, peripheral artery disease, Rt below knee amputation, left toe amputation, end stage renal disease, right hip fracture in 09/2023  PAIN:  Are you having pain?  Yes: NPRS scale: Unchanged Pain location: Supero-medial right knee and above the knee cap Pain description: Sharp, worsens with increased weight-bearing Aggravating factors: Standing and walking, stand to sit transition, knee flexion and hip flexion Relieving factors: Getting off the knee  PRECAUTIONS:  NO BP LUE, fall risk  RED FLAGS: None   WEIGHT BEARING RESTRICTIONS:  [place] notes increased WB increases symptoms  FALLS:  Has patient fallen in last 6 months?  Yes, cause of initial injury but no falls since that time.  LIVING ENVIRONMENT: Lives with: lives with their family Lives in: Mobile home Stairs:  Needs handrails Has following equipment at home: Single point cane, Environmental consultant - 4 wheeled, Wheelchair (manual), shower chair, and shower bars  OCCUPATION: Disability, drive, racing  PLOF:  Had 3 falls previous before the fall that resulted in a fracture   PATIENT GOALS: Be able to walk safely without the walker and with less knee pain  NEXT MD VISIT: 04/03/2024  OBJECTIVE:  Note: Objective measures were completed at Evaluation unless otherwise noted.  DIAGNOSTIC FINDINGS: 1. No meniscal or ligamentous injury of  the right knee. 2. Mild osseous contusion of the posteromedial tibial plateau. 3. Severe edema in infrapatellar Hoffa's fat likely reflecting a contusion secondary to direct trauma.  PATIENT SURVEYS:  Patient-Specific Activity Scoring Scheme  "0" represents "unable to perform." "10" represents "able to perform at prior level. 0 1 2 3 4 5 6 7 8 9  10 (Date and Score)   Activity 02/16/2024  03/06/2024  1. Walking with cane in community  0  0  2. Walking in house without device  0  0  3. Picking up items from floor 0 0  4.    5.    Score 0 0   Total score = sum of the activity scores/number of activities Minimum detectable change (90%CI) for average score = 2 points Minimum detectable change (90%CI) for single activity score = 3 points    COGNITION: Overall  cognitive status: Within functional limits for tasks assessed     SENSATION: Diabetic neuropathy of the left foot and some tingling above the right knee  EDEMA:  Jacobee notes swelling his left side but he gets off the right side before swelling is an issue  LOWER EXTREMITY ROM:  Active ROM Left/Right 02/04/2024   Hip flexion    Hip extension    Hip abduction    Hip adduction    Hip internal rotation    Hip external rotation    Knee flexion 140/118   Knee extension 0/0   Ankle dorsiflexion    Ankle plantarflexion    Ankle inversion    Ankle eversion     (Blank rows = not tested)  LOWER EXTREMITY STRENGTH:  Strength was assessed using circumferential thigh girth 15 cm proximal to the superior patellar pole Left/Right 02/04/2024 02/28/2024 Left/Right  Hip flexion    Hip extension    Hip abduction    Hip adduction    Hip internal rotation    Hip external rotation    Knee flexion    Knee extension 48.5 cm/47.0 cm 10-15% strength deficit per centimeter of atrophy 48.5 cm/47.0 cm 10-15% strength deficit per centimeter of atrophy  Ankle dorsiflexion    Ankle plantarflexion    Ankle inversion    Ankle eversion     (Blank rows = not tested)  GAIT: Evaluation on 02/04/2024: Distance walked: 50 feet Assistive device utilized: Environmental consultant - 4 wheeled Level of assistance: Complete Independence Comments: Ahnaf notes he has increased knee pain immediately upon standing.  He is very limited in his standing and walking endurance as a result.  Prosthetic Wear & Care: 02/16/2024:  He is wearing prosthesis from arising to bedtime.   No skin issues noted.                                                                                                                             TREATMENT DATE:  03/27/2024 Therapeutic Exercise: Nustep seat 15 level 7 with BLEs / BUEs for 4 min then level 5  with BLEs only. Leg extension machine 5# concentric LEs then isometric /eccentric single leg 10 reps with  ea LE; 1st set with bar just proximal to ankles and 2nd set with bar mid-tibia area.  Both with pt only able to lift to -45* ext.   Seated LAQ  - RLE with red theraband residual limb (prosthesis off) 10 reps and LLE no resistance 10 reps  Range limited by hamstring tightness and stretch pain Hamstring stretch seated with strap DF 30 sec hold 2 reps ea LE Gastroc stretch with forefoot on 2" step & weighting LE 30 sec hold 2 reps ea LE   Therapeutic Activities: Step down off 2" step with eccentric knee control with BUE support locked rollator walker: 5 reps with ea LE.     TREATMENT DATE:  03/24/2024 Quadriceps sets with pillow under knees 10 x 5 seconds Knee extension machine, most of the weight on the left on the way up, then shift a bit more weight to the right on the eccentrics 90-40 degrees with slow eccentrics 10 x with 10# and 10 x with 5# Seated straight leg raises 2 sets of 5 with PT light resistance and no prosthesis and 2 sets of 5 with prosthesis Seated knee extension with yellow Thera-Band (90-40 degrees and slow eccentrics) 2 sets of 10  Functional Activities: Yoga Bridge for bed mobility 2 sets of 10 for 5 seconds Step-down off 4 inch step (slow eccentrics) 2 sets of 10   03/13/2024 Therapeutic Exercise: Nustep seat 15 Level 7 with BLEs & BUEs for 4  min, then level 5 BLEs only 4 min Seated quad set 5 sec hold 10 reps.  Seated small range SLR 10 reps Eccentric LAQ 10 reps 2 sets  Neuromuscular Re-education: Step forward (heel past contralateral toes) with 2.5" between midfoot - 30 sec static RLE in front & in back 2 reps ea with intermittent touch.  PT manual resistance for quad set/hamstring set/quad set at various knee positions to facilitate co-contraction knee control.   Therapeutic Activities: 2" step up, over & step down with ea LE (contralateral LE stepping from terminal stance to initial contact position stepping over foam beam) BUE support //bars 6 reps.      PATIENT EDUCATION:  Education details: See above Person educated: Patient Education method: Explanation, Demonstration, Tactile cues, Verbal cues, and Handouts Education comprehension: verbalized understanding, returned demonstration, verbal cues required, tactile cues required, and needs further education  HOME EXERCISE PROGRAM: Access Code: [email or web address] URL: [email or web address] Date: 03/24/2024 Prepared by: Terral Ferrari  Exercises - Supine Quadricep Sets  - 5 x daily - 7 x weekly - 2 sets - 10 reps - 5 second hold - Seated Long Arc Quad  - 1 x daily - 7 x weekly - 2-3 sets - 10 reps - 2-3 seconds hold - Standing Hip Abduction Alternating legs  - 1 x daily - 4 x weekly - 2 sets - 10 reps - 5 seconds hold - Alternating Hip Flexion Forward Kicks  - 1 x daily - 4 x weekly - 2 sets - 10 reps - 5 seconds hold - Standing Hip Extension with Counter Support  - 1 x daily - 4 x weekly - 2 sets - 10 reps - 5 seconds hold - Seated Straight Leg Raise  - 2 x daily - 7 x weekly - 3-4 sets - 5 reps - 3 seconds hold - Yoga Bridge  - 2 x daily - 7 x weekly - 1-2 sets - 10  reps - 5 seconds hold - Seated Straight Leg Raise  - 3 x daily - 7 x weekly - 2-3 sets - 5 reps - 3 seconds hold  ASSESSMENT:  CLINICAL IMPRESSION: Patient continues to be limited by pain but non-verbal body language does not appear as high as previously.   Patient verbalizes understanding of HEP but does not seem consistent with performing.   Evaluation on 02/04/2024:  Patient is a 53 y.o. male who was seen today for physical therapy evaluation and treatment for M25.561,G89.29 (ICD-10-CM) - Chronic pain of right knee.  Pernell had a fall in November 2024 that resulted in a hip fracture.  His knee pain has been present since that time.  It is very likely that Garrie's right knee pain is related to the decrease in activity, strength and atrophy related to this fall and fracture.  Manual palpation of quadriceps sets shows  significantly decreased right quadriceps activation as compared to the left and significant atrophy is noted of the right thigh.  Beren was started on an appropriate home exercise program to improve quadricep strength, knee pain and allow him to improve his standing and walking endurance.  Given the 64-month history of this problem, and because of his other medical issues including severe diabetes and a below-knee amputation, Janie may require a longer than normal course of rehabilitation.  OBJECTIVE IMPAIRMENTS: Abnormal gait, cardiopulmonary status limiting activity, decreased activity tolerance, decreased balance, decreased endurance, decreased knowledge of condition, difficulty walking, decreased ROM, decreased strength, increased edema, impaired perceived functional ability, and pain.   ACTIVITY LIMITATIONS: carrying, bending, standing, squatting, stairs, transfers, and locomotion level  PARTICIPATION LIMITATIONS: community activity  PERSONAL FACTORS: Type 2 diabetes, HLD, HTN, peripheral artery disease, Rt below knee amputation, left toe amputation, end stage renal disease, right hip fracture in 09/2023 are also affecting patient's functional outcome.   REHAB POTENTIAL: Good  CLINICAL DECISION MAKING: Stable/uncomplicated  EVALUATION COMPLEXITY: Moderate   GOALS: Goals reviewed with patient? Yes  SHORT TERM GOALS: Target date: 03/03/2024 Tatsuo will be independent with his day 1 home exercise program Baseline: Started 02/04/2024 Goal status: MET  02/28/2024  2.  Improve right quadriceps strength as assessed by improved quadriceps activation as assessed manually by the physical therapist during a quadriceps set exercise Baseline: Poor activation at evaluation Goal status: MET 02/28/2024   LONG TERM GOALS: Target date: 03/31/2024  Improve patient specific functional scale to at least 50% for walking in home & community and picking up items Baseline: 0% Goal status: Ongoing 03/13/2024  2.   Theo will report right knee pain no greater than 3 out of 10 on the visual analog scale Baseline: As high as 10 out of 10 Goal status: Ongoing 03/24/2024  3.  Gennaro will be able to complete a set of 10 consecutive seated straight leg raises, demonstrating improved quadriceps strength Baseline: Unable at evaluation Goal status: Met 03/24/2024  4.  Ashir will report improved standing and walking endurance as compared to evaluation Baseline: Significantly increased right knee pain immediately upon standing which severely limits standing and walking endurance Goal status: Ongoing 03/24/2024  5.  Yarel will be independent with his long-term maintenance home exercise program Baseline: Started 02/04/2024 Goal status: Ongoing 03/24/2024  PLAN:  PT FREQUENCY: 1-2x/week  PT DURATION: 4 additional weeks  PLANNED INTERVENTIONS: 97110-Therapeutic exercises, 97530- Therapeutic activity, 97112- Neuromuscular re-education, 97535- Self Care, [id number]- Manual therapy, [phone]- Gait training, [phone]- Prosthetic training, [phone]- Vasopneumatic device, Patient/Family education, Balance training, Stair training, and Cryotherapy  PLAN FOR NEXT SESSION: check LTGs discharge vs re-certification.     Continue with quadriceps strengthening, balance, appropriate functional progressions to improve weight-bearing endurance and function.   Adalie Mand, PT, DPT 03/27/2024, 1:50 PM

## 2024-03-28 ENCOUNTER — Other Ambulatory Visit: Payer: Self-pay | Admitting: Student

## 2024-03-28 DIAGNOSIS — E1122 Type 2 diabetes mellitus with diabetic chronic kidney disease: Secondary | ICD-10-CM | POA: Diagnosis not present

## 2024-03-28 DIAGNOSIS — D509 Iron deficiency anemia, unspecified: Secondary | ICD-10-CM | POA: Diagnosis not present

## 2024-03-28 DIAGNOSIS — D631 Anemia in chronic kidney disease: Secondary | ICD-10-CM | POA: Diagnosis not present

## 2024-03-28 DIAGNOSIS — T8249XA Other complication of vascular dialysis catheter, initial encounter: Secondary | ICD-10-CM | POA: Diagnosis not present

## 2024-03-28 DIAGNOSIS — Z992 Dependence on renal dialysis: Secondary | ICD-10-CM | POA: Diagnosis not present

## 2024-03-28 DIAGNOSIS — D689 Coagulation defect, unspecified: Secondary | ICD-10-CM | POA: Diagnosis not present

## 2024-03-28 DIAGNOSIS — N2581 Secondary hyperparathyroidism of renal origin: Secondary | ICD-10-CM | POA: Diagnosis not present

## 2024-03-28 DIAGNOSIS — N186 End stage renal disease: Secondary | ICD-10-CM | POA: Diagnosis not present

## 2024-03-29 ENCOUNTER — Other Ambulatory Visit: Payer: Self-pay | Admitting: Student

## 2024-03-30 ENCOUNTER — Encounter: Payer: Self-pay | Admitting: Rehabilitative and Restorative Service Providers"

## 2024-03-30 ENCOUNTER — Ambulatory Visit (INDEPENDENT_AMBULATORY_CARE_PROVIDER_SITE_OTHER): Admitting: Rehabilitative and Restorative Service Providers"

## 2024-03-30 DIAGNOSIS — R296 Repeated falls: Secondary | ICD-10-CM

## 2024-03-30 DIAGNOSIS — M6281 Muscle weakness (generalized): Secondary | ICD-10-CM

## 2024-03-30 DIAGNOSIS — R262 Difficulty in walking, not elsewhere classified: Secondary | ICD-10-CM

## 2024-03-30 DIAGNOSIS — E1122 Type 2 diabetes mellitus with diabetic chronic kidney disease: Secondary | ICD-10-CM | POA: Diagnosis not present

## 2024-03-30 DIAGNOSIS — M25561 Pain in right knee: Secondary | ICD-10-CM

## 2024-03-30 DIAGNOSIS — D631 Anemia in chronic kidney disease: Secondary | ICD-10-CM | POA: Diagnosis not present

## 2024-03-30 DIAGNOSIS — R2681 Unsteadiness on feet: Secondary | ICD-10-CM

## 2024-03-30 DIAGNOSIS — N186 End stage renal disease: Secondary | ICD-10-CM | POA: Diagnosis not present

## 2024-03-30 DIAGNOSIS — T8249XA Other complication of vascular dialysis catheter, initial encounter: Secondary | ICD-10-CM | POA: Diagnosis not present

## 2024-03-30 DIAGNOSIS — D689 Coagulation defect, unspecified: Secondary | ICD-10-CM | POA: Diagnosis not present

## 2024-03-30 DIAGNOSIS — Z992 Dependence on renal dialysis: Secondary | ICD-10-CM | POA: Diagnosis not present

## 2024-03-30 DIAGNOSIS — D509 Iron deficiency anemia, unspecified: Secondary | ICD-10-CM | POA: Diagnosis not present

## 2024-03-30 DIAGNOSIS — G8929 Other chronic pain: Secondary | ICD-10-CM

## 2024-03-30 DIAGNOSIS — N2581 Secondary hyperparathyroidism of renal origin: Secondary | ICD-10-CM | POA: Diagnosis not present

## 2024-03-30 NOTE — Therapy (Addendum)
 OUTPATIENT PHYSICAL THERAPY LOWER EXTREMITY TREATMENT/PROGRESS NOTE  PHYSICAL THERAPY DISCHARGE SUMMARY  Visits from Start of Care: 12  Current functional level related to goals / functional outcomes: See note   Remaining deficits: See note   Education / Equipment: HEP   Patient agrees to discharge. Patient goals were partially met. Patient is being discharged due to not returning since the last visit.   Patient Name: [name] [name] MRN: [id number] DOB:January 05, 1971, 53 y.o., male, male Today's Date: 10/18/2024  END OF SESSION:   Progress Note Reporting Period 02/04/2024 to 03/30/2024  See note below for Objective Data and Assessment of Progress/Goals.     Past Medical History:  Diagnosis Date   Abdominal pain 11/30/2023   Abdominal tenderness 10/14/2023   Allergy    seasonal and environmental   Anemia    hx blood transfusion 04/27/24   Chest pain 12/01/2023   Chronic constipation 05/13/2020   Depression    Diabetes mellitus without complication (HCC) 2019   Dyspnea    hx - SOB with exertion   ESRD on hemodialysis (HCC)    Tues, Thurs, Sat   GERD (gastroesophageal reflux disease)    Hyperlipidemia    Hypertension    Hypertensive urgency 01/24/2024   Hypotension 12/01/2023   Necrotizing fasciitis (HCC) 10/13/2017   Neuromuscular disorder (HCC)    neuropathy   PAD (peripheral artery disease)    Patellar tendinitis 10/12/2023   Secondary hyperparathyroidism    SVT (supraventricular tachycardia)    hx   Wound dehiscence 11/24/2017   Past Surgical History:  Procedure Laterality Date   A/V FISTULAGRAM N/A 03/17/2024   Procedure: A/V Fistulagram;  Surgeon: Magda Debby SAILOR, MD;  Location: [facility] INV[facility];  Service: Cardiovascular;  Laterality: N/A;   AMPUTATION Right 10/13/2017   Procedure: RIGHT BELOW KNEE AMPUTATION;  Surgeon: Harden Jerona GAILS, MD;  Location: [facility] OR;  Service: Orthopedics;  Laterality: Right;   AMPUTATION Right 11/24/2017   Procedure: AMPUTATION  BELOW KNEE REVISION;  Surgeon: Harden Jerona GAILS, MD;  Location: [facility] OR;  Service: Orthopedics;  Laterality: Right;   AMPUTATION TOE Left    AV FISTULA PLACEMENT Left 10/19/2023   Procedure: LEFT BRACHIOCEPHALIC ARTERIOVENOUS (AV) FISTULA CREATION;  Surgeon: Pearline Norman RAMAN, MD;  Location: [facility] OR;  Service: Vascular;  Laterality: Left;   AV FISTULA PLACEMENT Left 01/26/2024   Procedure: LEFT ARTERIOVENOUS (AV) FISTULA CREATION;  Surgeon: Pearline Norman RAMAN, MD;  Location: [facility] OR;  Service: Vascular;  Laterality: Left;   BASCILIC VEIN TRANSPOSITION Left 04/26/2024   Procedure: LEFT SECOND STAGE TRANSPOSITION, VEIN, BASILIC;  Surgeon: Magda Debby SAILOR, MD;  Location: [facility] OR;  Service: Vascular;  Laterality: Left;   BIOPSY  09/02/2021   Procedure: BIOPSY;  Surgeon: Federico Rosario BROCKS, MD;  Location: [facility] ENDOSCOPY;  Service: Gastroenterology;;   BIOPSY  02/18/2023   Procedure: BIOPSY;  Surgeon: Leigh Elspeth SQUIBB, MD;  Location: [facility] ENDOSCOPY;  Service: Gastroenterology;;   COLONOSCOPY  05/11/2019   COLONOSCOPY  2021   3 polyps - tubular adenomas   ESOPHAGOGASTRODUODENOSCOPY (EGD) WITH PROPOFOL  N/A 09/02/2021   Procedure: ESOPHAGOGASTRODUODENOSCOPY (EGD) WITH PROPOFOL ;  Surgeon: Federico Rosario BROCKS, MD;  Location: [facility] ENDOSCOPY;  Service: Gastroenterology;  Laterality: N/A;   ESOPHAGOGASTRODUODENOSCOPY (EGD) WITH PROPOFOL  N/A 02/18/2023   Procedure: ESOPHAGOGASTRODUODENOSCOPY (EGD) WITH PROPOFOL ;  Surgeon: Leigh Elspeth SQUIBB, MD;  Location: [facility] ENDOSCOPY;  Service: Gastroenterology;  Laterality: N/A;   INCISION AND DRAINAGE OF WOUND Left 05/17/2024   Procedure: IRRIGATION AND DEBRIDEMENT WOUND;  Surgeon: Serene Gaile ORN,  MD;  Location: [facility] OR;  Service: Vascular;  Laterality: Left;  Arm   INTRAMEDULLARY (IM) NAIL INTERTROCHANTERIC Right 10/15/2023   Procedure: INTRAMEDULLARY (IM) NAIL INTERTROCHANTERIC;  Surgeon: Kendal Franky SQUIBB, MD;  Location: [facility] OR;  Service: Orthopedics;  Laterality: Right;   IR FLUORO GUIDE CV LINE  RIGHT  10/15/2023   IR TUNNELED CENTRAL VENOUS CATH PLC W IMG  07/31/2024   IR US  GUIDE VASC ACCESS RIGHT  10/15/2023   IRRIGATION AND DEBRIDEMENT KNEE Right 08/04/2024   Procedure: IRRIGATION AND DEBRIDEMENT KNEE, RIGHT;  Surgeon: Harden Jerona GAILS, MD;  Location[facility] OR;  Service: Orthopedics;  Laterality: Right;  Debridement Right Popliteal Fossa Abscess   POLYPECTOMY     WISDOM TOOTH EXTRACTION     Patient Active Problem List   Diagnosis Date Noted   Mechanical complication of dialysis catheter 07/29/2024   Abscess of right lower extremity 07/29/2024   Chronic health problem 07/29/2024   ESRD on hemodialysis (HCC) 07/29/2024   Anemia of chronic disease 07/29/2024   Nausea and vomiting 01/26/2024   A-V fistula 01/25/2024   Depression 01/24/2024   Nonimmune to hepatitis B virus 10/22/2023   Urinary retention 10/14/2023   Closed intertrochanteric fracture of right hip (HCC) 10/13/2023   Contracture, 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   Pleural effusion 02/16/2023   Allergic rhinitis 11/07/2022   Tobacco dependence 09/11/2022   ESRD (end stage renal disease) on dialysis (HCC) 09/11/2022   GERD with esophagitis 09/02/2021   Acute kidney injury superimposed on CKD 04/10/2021   Achilles tendon contracture, left 03/03/2018   Non-pressure chronic ulcer of other part of left foot limited to breakdown of skin (HCC) 02/02/2018   History of right below knee amputation (HCC) 12/22/2017   Acute posthemorrhagic anemia    Falls 11/24/2017   Severe hypertension 11/24/2017   Anemia 11/24/2017   Uncontrolled type 2 diabetes mellitus with hyperglycemia, with long-term current use of insulin  (HCC) 10/11/2017   ARF (acute renal failure) 10/11/2017    PCP: Wendel Lesch, MD  REFERRING PROVIDER: Jerona GAILS Harden, MD  REFERRING DIAG: [phone] (ICD-10-CM) - Chronic pain of right knee  THERAPY DIAG:  Difficulty in walking, not elsewhere classified - Plan: PT plan of care cert/re-cert  Repeated falls - Plan:  PT plan of care cert/re-cert  Unsteadiness on feet - Plan: PT plan of care cert/re-cert  Chronic pain of right knee - Plan: PT plan of care cert/re-cert  Muscle weakness (generalized) - Plan: PT plan of care cert/re-cert  Rationale for Evaluation and Treatment: Rehabilitation  ONSET DATE: 09/2023   SUBJECTIVE:   SUBJECTIVE STATEMENT: Catalino notes minimal progress with his right knee pain.  He reports good HEP compliance and has been good with his clinic attendance.   PERTINENT HISTORY: Type 2 diabetes, HLD, HTN, peripheral artery disease, Rt below knee amputation, left toe amputation, end stage renal disease, right hip fracture in 09/2023  PAIN:  Are you having pain? Yes: NPRS scale: 2-9/10 this week Pain location: Patellar pressure and pain Pain description: Sharp, worsens with increased weight-bearing Aggravating factors: Standing and walking, stand to sit transition, knee flexion and hip flexion Relieving factors: Getting off the knee  PRECAUTIONS:  NO BP LUE, fall risk  RED FLAGS: None   WEIGHT BEARING RESTRICTIONS: Isidore notes increased WB increases symptoms  FALLS:  Has patient fallen in last 6 months? Yes, cause of initial injury but no falls since that time.  LIVING ENVIRONMENT: Lives with: lives with their family Lives in: Mobile home Stairs: Needs handrails  Has following equipment at home: Single point cane, Walker - 4 wheeled, Wheelchair (manual), shower chair, and shower bars  OCCUPATION: Disability, drive, racing  PLOF: Had 3 falls previous before the fall that resulted in a fracture   PATIENT GOALS: Be able to walk safely without the walker and with less knee pain  NEXT MD VISIT: 04/03/2024  OBJECTIVE:  Note: Objective measures were completed at Evaluation unless otherwise noted.  DIAGNOSTIC FINDINGS: 1. No meniscal or ligamentous injury of the right knee. 2. Mild osseous contusion of the posteromedial tibial plateau. 3. Severe edema in  infrapatellar Hoffa's fat likely reflecting a contusion secondary to direct trauma.  PATIENT SURVEYS:  Patient-Specific Activity Scoring Scheme  0 represents "unable to perform." 10 represents "able to perform at prior level. 0 1 2 3 4 5 6 7 8 9  10 (Date and Score)   Activity 02/16/2024  03/06/2024 03/30/2024  1. Walking with cane in community  0  0 2  2. Walking in house without device  0  0 0  3. Picking up items from floor 0 0 2  4.     5.     Score 0 0 4/30 = 13.3%   Total score = sum of the activity scores/number of activities Minimum detectable change (90%CI) for average score = 2 points Minimum detectable change (90%CI) for single activity score = 3 points    COGNITION: Overall cognitive status: Within functional limits for tasks assessed     SENSATION: Diabetic neuropathy of the left foot and some tingling above the right knee  EDEMA:  Bradon notes swelling his left side but he gets off the right side before swelling is an issue  LOWER EXTREMITY ROM:  Active ROM Left/Right 02/04/2024 Right 03/30/2024  Hip flexion    Hip extension    Hip abduction    Hip adduction    Hip internal rotation    Hip external rotation    Knee flexion 140/118 115  Knee extension 0/0   Ankle dorsiflexion    Ankle plantarflexion    Ankle inversion    Ankle eversion     (Blank rows = not tested)  LOWER EXTREMITY STRENGTH:  Strength was assessed using circumferential thigh girth 15 cm proximal to the superior patellar pole Left/Right 02/04/2024 02/28/2024 Left/Right 03/30/2024 Left/Right  Hip flexion     Hip extension     Hip abduction     Hip adduction     Hip internal rotation     Hip external rotation     Knee flexion     Knee extension 48.5 cm/47.0 cm 10-15% strength deficit per centimeter of atrophy 48.5 cm/47.0 cm 10-15% strength deficit per centimeter of atrophy 48.5/47.5 cm 10-15% strength deficit per centimeter of atrophy  Ankle dorsiflexion     Ankle plantarflexion      Ankle inversion     Ankle eversion      (Blank rows = not tested)  GAIT: Evaluation on 02/04/2024: Distance walked: 50 feet Assistive device utilized: [employer]ant - 4 wheeled Level of assistance: Complete Independence Comments: Roniel notes he has increased knee pain immediately upon standing.  He is very limited in his standing and walking endurance as a result.  Prosthetic Wear & Care: 02/16/2024:  He is wearing prosthesis from arising to bedtime.   No skin issues noted.  TREATMENT DATE:  03/30/2024 Quadriceps sets with pillow under knees 10 x 5 seconds Knee extension machine, most of the weight on the left on the way up, then shift a bit more weight to the right on the eccentrics 90-40 degrees with slow eccentrics 5 x with 15# (PT assist with concentric); 10 x with 10# 10 x with 5# Seated straight leg raises 2 sets of 5 with PT no prosthesis; 1 set of 5 without prosthesis but with a 2# weight and 5 with prosthesis Verbally reviewed Seated knee extension with yellow Thera-Band (90-40 degrees and slow eccentrics)  Functional Activities: Yoga Bridge for bed mobility 2 sets of 10 for 5 seconds Verbally reviewed Step-down off 4 inch step (slow eccentrics) 2 sets of 10 Reviewed exam findings and Anuel's options of continued supervised PT vs transfer into independent rehabilitation   03/27/2024 Therapeutic Exercise: Nustep seat 15 level 7 with BLEs / BUEs for 4 min then level 5 with BLEs only. Leg extension machine 5# concentric LEs then isometric /eccentric single leg 10 reps with ea LE; 1st set with bar just proximal to ankles and 2nd set with bar mid-tibia area.  Both with pt only able to lift to -45* ext.   Seated LAQ  - RLE with red theraband residual limb (prosthesis off) 10 reps and LLE no resistance 10 reps  Range limited by hamstring tightness and stretch  pain Hamstring stretch seated with strap DF 30 sec hold 2 reps ea LE Gastroc stretch with forefoot on 2 step & weighting LE 30 sec hold 2 reps ea LE   Therapeutic Activities: Step down off 2 step with eccentric knee control with BUE support locked rollator walker: 5 reps with ea LE.    TREATMENT DATE:  03/24/2024 Quadriceps sets with pillow under knees 10 x 5 seconds Knee extension machine, most of the weight on the left on the way up, then shift a bit more weight to the right on the eccentrics 90-40 degrees with slow eccentrics 10 x with 10# and 10 x with 5# Seated straight leg raises 2 sets of 5 with PT light resistance and no prosthesis and 2 sets of 5 with prosthesis Seated knee extension with yellow Thera-Band (90-40 degrees and slow eccentrics) 2 sets of 10  Functional Activities: Yoga Bridge for bed mobility 2 sets of 10 for 5 seconds Step-down off 4 inch step (slow eccentrics) 2 sets of 10   PATIENT EDUCATION:  Education details: See above Person educated: Patient Education method: Explanation, Demonstration, Tactile cues, Verbal cues, and Handouts Education comprehension: verbalized understanding, returned demonstration, verbal cues required, tactile cues required, and needs further education  HOME EXERCISE PROGRAM: Access Code: [email or web address] URL: [email or web address] Date: 03/24/2024 Prepared by: Lamar Ivory  Exercises - Supine Quadricep Sets  - 5 x daily - 7 x weekly - 2 sets - 10 reps - 5 second hold - Seated Long Arc Quad  - 1 x daily - 7 x weekly - 2-3 sets - 10 reps - 2-3 seconds hold - Standing Hip Abduction Alternating legs  - 1 x daily - 4 x weekly - 2 sets - 10 reps - 5 seconds hold - Alternating Hip Flexion Forward Kicks  - 1 x daily - 4 x weekly - 2 sets - 10 reps - 5 seconds hold - Standing Hip Extension with Counter Support  - 1 x daily - 4 x weekly - 2 sets - 10 reps - 5 seconds hold - Seated Straight Leg Raise  -  2 x daily - 7 x weekly - 3-4  sets - 5 reps - 3 seconds hold - Yoga Bridge  - 2 x daily - 7 x weekly - 1-2 sets - 10 reps - 5 seconds hold - Seated Straight Leg Raise  - 3 x daily - 7 x weekly - 2-3 sets - 5 reps - 3 seconds hold  ASSESSMENT:  CLINICAL IMPRESSION: Bernd reports minimal subjective progress with his physical therapy.  He can now complete a straight leg raise with his prosthesis (unable at evaluation) and he is performing his quadriceps strengthening activities easier than at evaluation.  His slower than expected progress may be explained by his smoking and complex medical history.  Aceson has very atrophied thighs bilaterally and will certainly benefit from continued daily compliance with his home exercises.  Renold can also continue his supervised PT if compliance is a concern.  I gave Dacari the option of transfer into independent PT as his program is addressing his areas of concern vs continued supervised work.  He will speak with Dr. Harden on Monday and make his decision.  Evaluation on 02/04/2024:  Patient is a 53 y.o. male who was seen today for physical therapy evaluation and treatment for M25.561,G89.29 (ICD-10-CM) - Chronic pain of right knee.  Daiki had a fall in November 2024 that resulted in a hip fracture.  His knee pain has been present since that time.  It is very likely that Jevan's right knee pain is related to the decrease in activity, strength and atrophy related to this fall and fracture.  Manual palpation of quadriceps sets shows significantly decreased right quadriceps activation as compared to the left and significant atrophy is noted of the right thigh.  Momin was started on an appropriate home exercise program to improve quadricep strength, knee pain and allow him to improve his standing and walking endurance.  Given the 29-month history of this problem, and because of his other medical issues including severe diabetes and a below-knee amputation, Janie may require a longer than normal course of  rehabilitation.  OBJECTIVE IMPAIRMENTS: Abnormal gait, cardiopulmonary status limiting activity, decreased activity tolerance, decreased balance, decreased endurance, decreased knowledge of condition, difficulty walking, decreased ROM, decreased strength, increased edema, impaired perceived functional ability, and pain.   ACTIVITY LIMITATIONS: carrying, bending, standing, squatting, stairs, transfers, and locomotion level  PARTICIPATION LIMITATIONS: community activity  PERSONAL FACTORS: Type 2 diabetes, HLD, HTN, peripheral artery disease, Rt below knee amputation, left toe amputation, end stage renal disease, right hip fracture in 09/2023 are also affecting patient's functional outcome.   REHAB POTENTIAL: Good  CLINICAL DECISION MAKING: Stable/uncomplicated  EVALUATION COMPLEXITY: Moderate   GOALS: Goals reviewed with patient? Yes  SHORT TERM GOALS: Target date: 03/03/2024 Daquann will be independent with his day 1 home exercise program Baseline: Started 02/04/2024 Goal status: MET  02/28/2024  2.  Improve right quadriceps strength as assessed by improved quadriceps activation as assessed manually by the physical therapist during a quadriceps set exercise Baseline: Poor activation at evaluation Goal status: MET 02/28/2024   LONG TERM GOALS: Target date: 04/27/2024  Improve patient specific functional scale to at least 50% for walking in home & community and picking up items Baseline: 0% Goal status: Ongoing 03/30/2024  2.  Yuya will report right knee pain no greater than 3 out of 10 on the visual analog scale Baseline: As high as 10 out of 10 Goal status: Ongoing 03/30/2024  3.  Costas will be able to complete a set  of 10 consecutive seated straight leg raises, demonstrating improved quadriceps strength Baseline: Unable at evaluation Goal status: Met 03/24/2024  4.  Darrion will report improved standing and walking endurance as compared to evaluation Baseline: Significantly increased  right knee pain immediately upon standing which severely limits standing and walking endurance Goal status: Ongoing 03/30/2024  5.  Cloyde will be independent with his long-term maintenance home exercise program Baseline: Started 02/04/2024 Goal status: Met 03/30/2024  PLAN:  PT FREQUENCY: 1-2x/week if Jamaine wishes to continue supervised PT  PT DURATION: 4 additional weeks if Kien wishes to continue supervised PT  PLANNED INTERVENTIONS: 97110-Therapeutic exercises, 97530- Therapeutic activity, [id number]- Neuromuscular re-education, 97535- Self Care, [id number]- Manual therapy, [id number]- Gait training, [phone]- Prosthetic training, [phone]- Vasopneumatic device, Patient/Family education, Balance training, Stair training, and Cryotherapy  PLAN FOR NEXT SESSION: Continue with quadriceps strengthening, balance, appropriate functional progressions to improve weight-bearing endurance and function.   Myer LELON Ivory, PT, MPT 10/18/2024, 1:51 PM

## 2024-03-31 ENCOUNTER — Ambulatory Visit (INDEPENDENT_AMBULATORY_CARE_PROVIDER_SITE_OTHER): Admitting: Student

## 2024-03-31 ENCOUNTER — Encounter: Payer: Self-pay | Admitting: Student

## 2024-03-31 VITALS — BP 186/75 | HR 87 | Ht 77.0 in | Wt 232.8 lb

## 2024-03-31 DIAGNOSIS — J189 Pneumonia, unspecified organism: Secondary | ICD-10-CM

## 2024-03-31 MED ORDER — ALBUTEROL SULFATE HFA 108 (90 BASE) MCG/ACT IN AERS: 2.0000 | INHALATION_SPRAY | Freq: Four times a day (QID) | RESPIRATORY_TRACT | 2 refills | Status: DC | PRN

## 2024-03-31 MED ORDER — PREDNISONE 20 MG PO TABS: 40.0000 mg | ORAL_TABLET | Freq: Every day | ORAL | 0 refills | Status: DC

## 2024-03-31 NOTE — Patient Instructions (Addendum)
 It was great to see you! Thank you for allowing me to participate in your care!   Our plans for today:  - We can try steroid for 3 days and I would like for you to schedule a follow up on Monday or Tuesday with me  - Return to care if fevers, worsening shortness of breath - Please do mucinex  (regular mucinex , not mucinex  d) and flonase  - I am sending in albuterol  to see if this may help alsp   Take care and seek immediate care sooner if you develop any concerns.  Genora Kidd, MD

## 2024-03-31 NOTE — Progress Notes (Signed)
    SUBJECTIVE:   CHIEF COMPLAINT / HPI: ED fu  Discussed the use of AI scribe software for clinical note transcription with the patient, who gave verbal consent to proceed.  5/1 [facility] visit  Suspected PNA-prescribed Augmentin  and Doxycycline  x 10 days  03/26/24 [facility] ED "CT Noncon Lung-Findings suspicious for right middle lobe pneumonia. There are likely reactive lymph nodes with small bilateral pleural effusions. Pleural effusions could be due to patient's kidney disease. Patient already being treated with antibiotics for likely pneumonia infection.recommend patient continue antibiotic treatment. Patient is afebrile with stable vitals." Flu/RSV/Covid negative History of Present Illness [name] [name] is a 53 year old male with end-stage renal disease who presents with persistent cough and congestion.  He has a persistent runny nose, congestion, and a strong productive cough with yellow mucus since early May. The cough disrupts his sleep, but there is no hemoptysis. He occasionally experiences vomiting. There is no fever. Symptoms have not improved with antibiotics. He experiences some shortness of breath, which remains unchanged. He smokes half a pack per day but has reduced smoking due to shortness of breath. His sister and mother have similar, less severe symptoms. He has not used inhalers, nasal sprays, or Flonase . It is unclear if he uses Mucinex  regularly. He attends dialysis sessions without issue.  PERTINENT  PMH / PSH: reviewed  OBJECTIVE:   BP (!) 186/75   Pulse 87   Ht 6\' 5"  (1.956 m)   Wt 232 lb 12.8 oz (105.6 kg)   SpO2 98%   BMI 27.61 kg/m   General: NAD, awake, alert, responsive to questions Head: Normocephalic atraumatic, severe nasal congestion bilaterally, no oropharyngeal exudates CV: Regular rate and rhythm no murmurs rubs or gallops Respiratory: Poor air movement throughout posterior lung fields, chest rises symmetrically,  no increased work of breathing on room  air ASSESSMENT/PLAN:   Assessment & Plan Pneumonia of right middle lobe due to infectious organism Persistent pneumonia symptoms with right middle lobe infiltrate on CT. Considered viral infection, lung cancer, COPD due to smoking history.  - Prescribe 3-day course of prednisone  - Prescribe albuterol  breathing treatments. - Advise use of Flonase  at night and mucinex  - Instruct to return to ED if symptoms worsen or fever develops over weekend - Schedule follow-up appointment for Monday or Tuesday.    Genora Kidd, MD [facility]

## 2024-04-01 DIAGNOSIS — D689 Coagulation defect, unspecified: Secondary | ICD-10-CM | POA: Diagnosis not present

## 2024-04-01 DIAGNOSIS — D509 Iron deficiency anemia, unspecified: Secondary | ICD-10-CM | POA: Diagnosis not present

## 2024-04-01 DIAGNOSIS — T8249XA Other complication of vascular dialysis catheter, initial encounter: Secondary | ICD-10-CM | POA: Diagnosis not present

## 2024-04-01 DIAGNOSIS — Z992 Dependence on renal dialysis: Secondary | ICD-10-CM | POA: Diagnosis not present

## 2024-04-01 DIAGNOSIS — D631 Anemia in chronic kidney disease: Secondary | ICD-10-CM | POA: Diagnosis not present

## 2024-04-01 DIAGNOSIS — N2581 Secondary hyperparathyroidism of renal origin: Secondary | ICD-10-CM | POA: Diagnosis not present

## 2024-04-01 DIAGNOSIS — E1122 Type 2 diabetes mellitus with diabetic chronic kidney disease: Secondary | ICD-10-CM | POA: Diagnosis not present

## 2024-04-01 DIAGNOSIS — N186 End stage renal disease: Secondary | ICD-10-CM | POA: Diagnosis not present

## 2024-04-03 ENCOUNTER — Ambulatory Visit

## 2024-04-03 ENCOUNTER — Ambulatory Visit (INDEPENDENT_AMBULATORY_CARE_PROVIDER_SITE_OTHER): Admitting: Orthopedic Surgery

## 2024-04-03 VITALS — BP 147/71 | HR 69 | Temp 98.1°F | Wt 238.4 lb

## 2024-04-03 DIAGNOSIS — M62838 Other muscle spasm: Secondary | ICD-10-CM

## 2024-04-03 DIAGNOSIS — S88111S Complete traumatic amputation at level between knee and ankle, right lower leg, sequela: Secondary | ICD-10-CM

## 2024-04-03 DIAGNOSIS — G8929 Other chronic pain: Secondary | ICD-10-CM

## 2024-04-03 DIAGNOSIS — Z89511 Acquired absence of right leg below knee: Secondary | ICD-10-CM

## 2024-04-03 DIAGNOSIS — J189 Pneumonia, unspecified organism: Secondary | ICD-10-CM

## 2024-04-03 DIAGNOSIS — J439 Emphysema, unspecified: Secondary | ICD-10-CM

## 2024-04-03 DIAGNOSIS — M25561 Pain in right knee: Secondary | ICD-10-CM

## 2024-04-03 MED ORDER — UMECLIDINIUM-VILANTEROL 62.5-25 MCG/ACT IN AEPB: 1.0000 | INHALATION_SPRAY | Freq: Every day | RESPIRATORY_TRACT | 0 refills | Status: DC

## 2024-04-03 MED ORDER — PREDNISONE 20 MG PO TABS: 40.0000 mg | ORAL_TABLET | Freq: Every day | ORAL | 0 refills | Status: AC

## 2024-04-03 NOTE — Patient Instructions (Addendum)
 It was great to see you! Thank you for allowing me to participate in your care!  Our plans for today:  - You have a spasm of your right cervical muscle, that is causing your neck pain. - Please follow the exercises below to help with that. - I have sent two more additional days of steroids to your pharmacy to help with your wheezing. - Please start using the inhaler anoro ellipto 1 puff daily in to your lungs.   Please arrive 15 minutes PRIOR to your next scheduled appointment time! If you do not, this affects OTHER patients' care.  Take care and seek immediate care sooner if you develop any concerns.   Ivin Marrow, MD, PGY-2 [facility] 10:46 AM 04/03/2024  [facility]

## 2024-04-03 NOTE — Progress Notes (Signed)
    SUBJECTIVE:   CHIEF COMPLAINT / HPI: pneumonia f/u  COPD/Pneumonia -Not feeling better -Has finished antibiotics -Has taken prednisone  -Still having cough often -Reports that has been going on for 4 weeks -Half a pack per day  Neck pain -4-5 days -Hard to turn head to left -Sister says left side of face is drooping, ongoing since January -Holds mouth -No numbness or tingling -No weakness or slurring  PERTINENT  PMH / PSH: T2DM, ESRD, HTN  OBJECTIVE:   BP (!) 147/71   Pulse 69   Temp 98.1 F (36.7 C) (Oral)   Wt 238 lb 6 oz (108.1 kg)   SpO2 100%   BMI 28.27 kg/m   General: NAD, chronically ill-appearing Neck: Right cervical paraspinal muscle very tender and tense Neuro: A&O, pupils equal round and reactive, extraocular motion intact, symmetric facies, cranial nerves II through XII intact, strength 5 out of 5 bilateral upper extremities, sensation 5 out of 5 bilateral upper and lower extremities, gait normal with walker and prosthetic Cardiovascular: RRR, no murmurs, no peripheral edema Respiratory: normal WOB on RA, CTAB, no wheezes, ronchi or rales Extremities: Moving all 4 extremities equally    ASSESSMENT/PLAN:   Assessment & Plan Pneumonia of right middle lobe due to infectious organism Suspected patient's pneumonia is resolved after 10-day course of antibiotics.  Reassuringly normal work of breathing on room air without percent saturation.  Persisting respiratory symptoms related to the below problem. Pulmonary emphysema, unspecified emphysema type (HCC) Suspect patient had COPD exacerbation secondary to pneumonia above.  Will extend steroid course for total 5 days, as symptomatically he is not improved.  Will additionally add an Anora Ellipta maintenance inhaler.  Per my chart review, patient has no documented PFTs but given smoking history likely has component of obstructive disease.  Complicating this picture patient had recent CT at outside facility that  showed known mediastinal lymph nodes and bilateral small pleural effusions.  If not improving with above measures, would consider echocardiogram (however, volume status would be managed with dialysis) versus repeat CT for failure of outpatient antibiotic treatment of pneumonia.  Would not be unreasonable to also refer to pulmonology.  Counseled again regarding smoking cessation.  Follow-up 1 week. Cervical paraspinal muscle spasm Neurologic exam very reassuring, no weakness, no clinical signs of stroke despite reported facial droop.  Cranial nerve VII completely intact.  Recommended Tylenol  as needed for pain.  Counseled and provided with gentle stretching to help improve his pain over the long-term.  Proceeded with trigger point injection as described below.  PROCEDURE: TRIGGER POINT INJECTION Location: Right posterior neck Diagnosis: Right cervical paraspinal muscle spasm Patient was given informed consent, signed copy in the chart. Appropriate time out was taken. Location was prepped with alcohol swab. A 25 gauge 1 inch needle was used. 2 cc of 1% lidocaine  without epinephrine  was injected into the right paraspinal muscle within the muscle body at a 30 degree angle.  The patient tolerated the procedure well. There were no complications. Post procedure instructions were given.   Return in about 2 weeks (around 04/17/2024) for f/u.  Ivin Marrow, MD [facility]

## 2024-04-04 ENCOUNTER — Encounter: Payer: Self-pay | Admitting: Orthopedic Surgery

## 2024-04-04 DIAGNOSIS — D631 Anemia in chronic kidney disease: Secondary | ICD-10-CM | POA: Diagnosis not present

## 2024-04-04 DIAGNOSIS — N186 End stage renal disease: Secondary | ICD-10-CM | POA: Diagnosis not present

## 2024-04-04 DIAGNOSIS — D689 Coagulation defect, unspecified: Secondary | ICD-10-CM | POA: Diagnosis not present

## 2024-04-04 DIAGNOSIS — E1122 Type 2 diabetes mellitus with diabetic chronic kidney disease: Secondary | ICD-10-CM | POA: Diagnosis not present

## 2024-04-04 DIAGNOSIS — T8249XA Other complication of vascular dialysis catheter, initial encounter: Secondary | ICD-10-CM | POA: Diagnosis not present

## 2024-04-04 DIAGNOSIS — N2581 Secondary hyperparathyroidism of renal origin: Secondary | ICD-10-CM | POA: Diagnosis not present

## 2024-04-04 DIAGNOSIS — Z992 Dependence on renal dialysis: Secondary | ICD-10-CM | POA: Diagnosis not present

## 2024-04-04 DIAGNOSIS — D509 Iron deficiency anemia, unspecified: Secondary | ICD-10-CM | POA: Diagnosis not present

## 2024-04-04 NOTE — Progress Notes (Addendum)
 Office Visit Note   Patient: [name] [name]           Date of Birth: November 16, 1971           MRN: [id number] Visit Date: 04/03/2024              Requested by: Genora Kidd, MD [address] [place],  [place] [address] PCP: Genora Kidd, MD  Chief Complaint  Patient presents with   Right Knee - Follow-up      HPI: Patient is a 53 year old gentleman right transtibial amputee who is having increased pain in the right knee.  Patient has had an injection March 10 without relief.  Patient complains of pain on the patella from subsiding into the socket.  Assessment & Plan: Visit Diagnoses:  1. Chronic pain of right knee   2. Below-knee amputation of right lower extremity, sequela (HCC)     Plan: Prescription provided for new socket liner and supplies.  Follow-Up Instructions: Return if symptoms worsen or fail to improve.   Ortho Exam  Patient is alert, oriented, no adenopathy, well-dressed, normal affect, normal respiratory effort. Examination patient's socket is 53 years old he is subsiding into the socket.  There is red pressure points over the medial femoral condyle and patella from subsiding.  Patient's MRI scan was reviewed which showed edema without intra-articular pathology.  There is no effusion.  There is no end bearing ulcer.  Patient is an existing right transtibial  amputee.  Patient's current comorbidities are not expected to impact the ability to function with the prescribed prosthesis. Patient verbally communicates a strong desire to use a prosthesis. Patient currently requires mobility aids to ambulate without a prosthesis.  Expects not to use mobility aids with a new prosthesis. Patient is expected to resume or reach their K Level within 6 months. Patient was active before the amputation and independent with stairs, uneven terrain, varying cadence, and a community ambulator.  Patient is a K3 level ambulator that spends a lot of time walking around on  uneven terrain over obstacles, up and down stairs, and ambulates with a variable cadence.    Imaging: No results found. No images are attached to the encounter.  Labs: Lab Results  Component Value Date   HGBA1C 6.1 (H) 10/13/2023   HGBA1C 6.6 05/18/2023   HGBA1C 7.2 (H) 09/11/2022   ESRSEDRATE 68 (H) 04/10/2021   ESRSEDRATE 75 (H) 10/20/2017   CRP 1.9 (H) 10/20/2017   REPTSTATUS 09/15/2022 FINAL 09/10/2022   GRAMSTAIN  07/17/2021    FEW SQUAMOUS EPITHELIAL CELLS PRESENT FEW WBC SEEN FEW GRAM NEGATIVE RODS RARE GRAM POSITIVE COCCI Performed at [facility], [address], [place], [place] [address]    CULT  09/10/2022    NO GROWTH 5 DAYS Performed at [facility], [address] [place], [place] [address]    Joe Murders PROTEUS MIRABILIS 07/17/2021     Lab Results  Component Value Date   ALBUMIN  2.6 (L) 01/23/2024   ALBUMIN  2.9 (L) 01/22/2024   ALBUMIN  2.0 (L) 12/03/2023    Lab Results  Component Value Date   MG 1.8 02/12/2023   MG 1.7 09/02/2021   MG 2.0 10/13/2017   Lab Results  Component Value Date   VD25OH 10.22 (L) 10/14/2023    No results found for: "PREALBUMIN"    Latest Ref Rng & Units 03/17/2024    6:35 AM 02/16/2024   12:15 PM 01/26/2024    3:29 AM  CBC EXTENDED  WBC 3.4 - 10.8 x10E3/uL  6.5  8.1   RBC 4.14 - 5.80 x10E6/uL  3.57  3.96   Hemoglobin 13.0 - 17.0 g/dL 65.7  9.7  84.6   HCT 96.2 - 52.0 % 33.0  31.1  32.9   Platelets 150 - 450 x10E3/uL  118  97      There is no height or weight on file to calculate BMI.  Orders:  No orders of the defined types were placed in this encounter.  No orders of the defined types were placed in this encounter.    Procedures: No procedures performed  Clinical Data: No additional findings.  ROS:  All other systems negative, except as noted in the HPI. Review of Systems  Objective: Vital Signs: There were no vitals taken for this visit.  Specialty Comments:  No specialty comments  available.  PMFS History: Patient Active Problem List   Diagnosis Date Noted   Nausea and vomiting 01/26/2024   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/25/2024   Depression 01/24/2024   Hypertensive urgency 01/24/2024   Hypotension 12/01/2023   Altered mental status 11/30/2023   ESRD (end stage renal disease) on dialysis (HCC) 11/30/2023   Right knee pain 10/24/2023   Nonimmune to hepatitis B virus 10/22/2023   Closed fracture of right hip (HCC) 10/15/2023   T2DM (type 2 diabetes mellitus) (HCC) 10/14/2023   Urinary retention 10/14/2023   Closed intertrochanteric fracture of right hip (HCC) 10/13/2023   Patellar tendinitis 10/12/2023   Wrist sprain, left, initial encounter 10/12/2023   Contracture, 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   Mucosal abnormality of esophagus 02/18/2023   Foot pain, left 02/17/2023   SOB (shortness of breath) 02/16/2023   History of esophagitis 02/16/2023   Tachycardia 02/15/2023   Allergic rhinitis 11/07/2022   Chronic kidney disease, stage 5 (HCC) 09/11/2022   Tobacco dependence 09/11/2022   GERD with esophagitis 09/02/2021   Acute kidney injury superimposed on CKD (HCC) 04/10/2021   Achilles tendon contracture, left 03/03/2018   Non-pressure chronic ulcer of other part of left foot limited to breakdown of skin (HCC) 02/02/2018   History of right below knee amputation (HCC) 12/22/2017   Acute posthemorrhagic anemia    Falls 11/24/2017   Severe hypertension 11/24/2017   Anemia 11/24/2017   Uncontrolled type 2 diabetes mellitus with hyperglycemia, with long-term current use of insulin  (HCC) 10/11/2017   ARF (acute renal failure) (HCC) 10/11/2017   Past Medical History:  Diagnosis Date   Abdominal pain 11/30/2023   Abdominal tenderness 10/14/2023   Allergy    seasonal and environmental   Anemia    Chest pain 12/01/2023   Chronic constipation 05/13/2020   Depression    Diabetes mellitus without complication (HCC) 2019   Dyspnea    hx - SOB with exertion   ESRD on  hemodialysis (HCC)    Tues, Thurs, Sat   GERD (gastroesophageal reflux disease)    Hyperlipidemia    Hypertension    Necrotizing fasciitis (HCC) 10/13/2017   Neuromuscular disorder (HCC)    neuropathy   PAD (peripheral artery disease) (HCC)    Secondary hyperparathyroidism (HCC)    SVT (supraventricular tachycardia) (HCC)    hx   Wound dehiscence 11/24/2017    Family History  Problem Relation Age of Onset   Diabetes Mellitus II Mother    Depression Mother    Colon polyps Mother    Hypertension Mother    High Cholesterol Mother    Heart disease Father        CABG  x 4.  04/2017   Diabetes Father    High Cholesterol Father    Hypertension Father    Heart disease Maternal Grandmother    Heart disease Maternal Grandfather    Colon cancer Neg Hx    Esophageal cancer Neg Hx    Liver cancer Neg Hx    Pancreatic cancer Neg Hx    Rectal cancer Neg Hx    Stomach cancer Neg Hx     Past Surgical History:  Procedure Laterality Date   A/V FISTULAGRAM N/A 03/17/2024   Procedure: A/V Fistulagram;  Surgeon: Carlene Che, MD;  Location: [facility] INVASIVE [facility] LAB;  Service: Cardiovascular;  Laterality: N/A;   AMPUTATION Right 10/13/2017   Procedure: RIGHT BELOW KNEE AMPUTATION;  Surgeon: Timothy Ford, MD;  Location: [facility] OR;  Service: Orthopedics;  Laterality: Right;   AMPUTATION Right 11/24/2017   Procedure: AMPUTATION BELOW KNEE REVISION;  Surgeon: Timothy Ford, MD;  Location: [facility] OR;  Service: Orthopedics;  Laterality: Right;   AMPUTATION TOE Left    AV FISTULA PLACEMENT Left 10/19/2023   Procedure: LEFT BRACHIOCEPHALIC ARTERIOVENOUS (AV) FISTULA CREATION;  Surgeon: Philipp Brawn, MD;  Location: [facility] OR;  Service: Vascular;  Laterality: Left;   AV FISTULA PLACEMENT Left 01/26/2024   Procedure: LEFT ARTERIOVENOUS (AV) FISTULA CREATION;  Surgeon: Philipp Brawn, MD;  Location: [facility] OR;  Service: Vascular;  Laterality: Left;   BIOPSY  09/02/2021   Procedure: BIOPSY;  Surgeon: Daina Drum,  MD;  Location: [facility] ENDOSCOPY;  Service: Gastroenterology;;   BIOPSY  02/18/2023   Procedure: BIOPSY;  Surgeon: Ace Holder, MD;  Location: [facility] ENDOSCOPY;  Service: Gastroenterology;;   COLONOSCOPY  05/11/2019   COLONOSCOPY  2021   3 polyps - tubular adenomas   ESOPHAGOGASTRODUODENOSCOPY (EGD) WITH PROPOFOL  N/A 09/02/2021   Procedure: ESOPHAGOGASTRODUODENOSCOPY (EGD) WITH PROPOFOL ;  Surgeon: Daina Drum, MD;  Location: [facility] ENDOSCOPY;  Service: Gastroenterology;  Laterality: N/A;   ESOPHAGOGASTRODUODENOSCOPY (EGD) WITH PROPOFOL  N/A 02/18/2023   Procedure: ESOPHAGOGASTRODUODENOSCOPY (EGD) WITH PROPOFOL ;  Surgeon: Ace Holder, MD;  Location[facility] ENDOSCOPY;  Service: Gastroenterology;  Laterality: N/A;   INTRAMEDULLARY (IM) NAIL INTERTROCHANTERIC Right 10/15/2023   Procedure: INTRAMEDULLARY (IM) NAIL INTERTROCHANTERIC;  Surgeon: Laneta Pintos, MD;  Location: [facility] OR;  Service: Orthopedics;  Laterality: Right;   IR FLUORO GUIDE CV LINE RIGHT  10/15/2023   IR US  GUIDE VASC ACCESS RIGHT  10/15/2023   POLYPECTOMY     WISDOM TOOTH EXTRACTION     Social History   Occupational History   Occupation: unemployed  Tobacco Use   Smoking status: Every Day    Current packs/day: 0.50    Average packs/day: 0.5 packs/day for 35.9 years (17.9 ttl pk-yrs)    Types: Cigarettes    Start date: 11/23/1988   Smokeless tobacco: Never   Tobacco comments:    Uninterested - 0/10  importance of quitting 02/2023  Vaping Use   Vaping status: Never Used  Substance and Sexual Activity   Alcohol use: No   Drug use: No   Sexual activity: Not Currently

## 2024-04-06 ENCOUNTER — Ambulatory Visit (INDEPENDENT_AMBULATORY_CARE_PROVIDER_SITE_OTHER): Admitting: Gastroenterology

## 2024-04-06 ENCOUNTER — Encounter: Payer: Self-pay | Admitting: Gastroenterology

## 2024-04-06 VITALS — BP 162/84 | HR 84 | Ht 77.0 in | Wt 239.0 lb

## 2024-04-06 DIAGNOSIS — K219 Gastro-esophageal reflux disease without esophagitis: Secondary | ICD-10-CM | POA: Diagnosis not present

## 2024-04-06 DIAGNOSIS — E1122 Type 2 diabetes mellitus with diabetic chronic kidney disease: Secondary | ICD-10-CM | POA: Diagnosis not present

## 2024-04-06 DIAGNOSIS — R933 Abnormal findings on diagnostic imaging of other parts of digestive tract: Secondary | ICD-10-CM | POA: Diagnosis not present

## 2024-04-06 DIAGNOSIS — K5900 Constipation, unspecified: Secondary | ICD-10-CM

## 2024-04-06 DIAGNOSIS — Z992 Dependence on renal dialysis: Secondary | ICD-10-CM | POA: Diagnosis not present

## 2024-04-06 DIAGNOSIS — K862 Cyst of pancreas: Secondary | ICD-10-CM | POA: Diagnosis not present

## 2024-04-06 DIAGNOSIS — D509 Iron deficiency anemia, unspecified: Secondary | ICD-10-CM | POA: Diagnosis not present

## 2024-04-06 DIAGNOSIS — R112 Nausea with vomiting, unspecified: Secondary | ICD-10-CM

## 2024-04-06 DIAGNOSIS — D631 Anemia in chronic kidney disease: Secondary | ICD-10-CM | POA: Diagnosis not present

## 2024-04-06 DIAGNOSIS — D500 Iron deficiency anemia secondary to blood loss (chronic): Secondary | ICD-10-CM | POA: Diagnosis not present

## 2024-04-06 DIAGNOSIS — D689 Coagulation defect, unspecified: Secondary | ICD-10-CM | POA: Diagnosis not present

## 2024-04-06 DIAGNOSIS — N2581 Secondary hyperparathyroidism of renal origin: Secondary | ICD-10-CM | POA: Diagnosis not present

## 2024-04-06 DIAGNOSIS — N186 End stage renal disease: Secondary | ICD-10-CM | POA: Diagnosis not present

## 2024-04-06 DIAGNOSIS — T8249XA Other complication of vascular dialysis catheter, initial encounter: Secondary | ICD-10-CM | POA: Diagnosis not present

## 2024-04-06 MED ORDER — POLYETHYLENE GLYCOL 3350 17 G PO PACK: 17.0000 g | PACK | Freq: Every day | ORAL | 2 refills | Status: DC

## 2024-04-06 NOTE — Patient Instructions (Addendum)
 _______________________________________________________  If your blood pressure at your visit was 140/90 or greater, please contact your primary care physician to follow up on this.  If you are age 51 or younger, your body mass index should be between 19-25. Your Body mass index is 28.34 kg/m. If this is out of the aformentioned range listed, please consider follow up with your Primary Care Provider.  ________________________________________________________  The  GI providers would like to encourage you to use MYCHART to communicate with providers for non-urgent requests or questions.  Due to long hold times on the telephone, sending your provider a message by [facility] may be a faster and more efficient way to get a response.  Please allow 48 business hours for a response.  Please remember that this is for non-urgent requests.  _______________________________________________________  [name] [name] will need a MRI pancreas in November.  We will contact you to schedule this.  It was a pleasure to see you today!  Thank you for trusting me with your gastrointestinal care!

## 2024-04-06 NOTE — Progress Notes (Signed)
 [facility] Progress Note  Chief Complaint: Nausea and vomiting, abnormal CT scan abdomen  Subjective  Prior history  From my June 2024 office note: Summary of GI issues: Last office visit with Dr. Dominic Friendly October 2022 after hospitalization for protracted vomiting.  Inpatient EGD by Dr. Rosaline Coma showed severe esophagitis. 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  Insufficient response to renal dosing of metoclopramide . Inpatient colonoscopy for heme positive stool in 2020, poor prep.  Outpatient colonoscopy July 2021 (2-day prep with Suprep/MiraLAX ) colon prep initially fair, improved with lavage.  3 subcentimeter tubular adenomas removed, 3-year recall recommended. He was hospitalized in March of this year for progressive dyspnea, tachycardia and acute on chronic anemia.  Dr. Tena Feeling consult note was reviewed, anemia was felt most likely due to his CKD.  No overt GI bleeding, no stool testing for occult blood done, iron  studies were normal.  Inpatient EGD by Dr. General Kenner discovered healing of the previously discovered esophagitis, essentially no source of upper GI blood loss.  It was felt that colonoscopy would likely be of low yield regarding the anemia, and that a 2-day bowel preparation difficult for the patient to undergo.  I felt the complexity of [name] [name]'s medical problems as well as his chronic nausea and vomiting issues precluded him from being able to do it necessary to date bowel preparation for colonoscopy.  He would also be at high risk for severe hyper or hypoglycemia during the bowel preparation due to his brittle diabetes.  His worsening anemia was due to his worsening CKD in my opinion.  Admitted to the hospital early March 2025 and seen by APP and Dr. Leonia Raman in consultation for recurrent episode of nausea vomiting upper abdominal pain in the setting of  accelerated hypertension.  Some imaging findings noted below.  He was reportedly treated with IV metoclopramide  during that hospitalization.  Discussed the use of AI scribe software for clinical note transcription with the patient, who gave verbal consent to proceed.  History of Present Illness [name] [name] is a 53 year old male with diabetes and recent dialysis initiation who presents for follow-up on abdominal findings and gastrointestinal symptoms.  He was hospitalized a couple of months ago due to uncontrolled hypertension and vomiting. During this hospitalization, a CT scan of the abdomen was performed, which raised concerns about an abnormality in the colon and again described a cluster of small cysts in the pancreas. These cysts had been observed in a prior scan a few years ago and appeared "similar to perhaps slightly progressed".  He has a history of stomach problems, primarily vomiting, which has decreased in frequency to about once a week. No current medication is being used to manage this symptom.  He has been on dialysis since November 2024, and recently had a procedure to mature his AV fistula.  He notes that his diabetes management has improved, with better hemoglobin A1c levels compared to a few years ago. His diabetes care is managed by [facility] endocrinology.  Regarding bowel movements, he reports having them two to three times a week, which he describes as 'for the most part' satisfactory, though sometimes difficult. He is not currently taking any medication to aid bowel movements and is unsure about starting any new treatments.  His past medical history includes a previous colonoscopy that revealed precancerous polyps, which required a challenging two-day bowel preparation. At that time, his kidney function was declining, and his  blood sugar was poorly controlled.    ROS: Cardiovascular:  no chest pain Respiratory: no dyspnea  The patient's Past Medical, Family  and Social History were reviewed and are on file in the EMR. Past Medical History:  Diagnosis Date   Abdominal pain 11/30/2023   Abdominal tenderness 10/14/2023   Allergy    seasonal and environmental   Anemia    Chest pain 12/01/2023   Chronic constipation 05/13/2020   Depression    Diabetes mellitus without complication (HCC) 2019   Dyspnea    hx - SOB with exertion   ESRD on hemodialysis (HCC)    Tues, Thurs, Sat   GERD (gastroesophageal reflux disease)    Hyperlipidemia    Hypertension    Necrotizing fasciitis (HCC) 10/13/2017   Neuromuscular disorder (HCC)    neuropathy   PAD (peripheral artery disease) (HCC)    Secondary hyperparathyroidism (HCC)    SVT (supraventricular tachycardia) (HCC)    hx   Wound dehiscence 11/24/2017    Past Surgical History:  Procedure Laterality Date   A/V FISTULAGRAM N/A 03/17/2024   Procedure: A/V Fistulagram;  Surgeon: Carlene Che, MD;  [facility]: [facility] IN[facility];  Service: Cardiovascular;  Laterality: N/A;   AMPUTATION Right 10/13/2017   Procedure: RIGHT BELOW KNEE AMPUTATION;  Surgeon: Timothy Ford, MD;  Location: [facility] OR;  Service: Orthopedics;  Laterality: Right;   AMPUTATION Right 11/24/2017   Procedure: AMPUTATION BELOW KNEE REVISION;  Surgeon: Timothy Ford, MD;  Location: [facility] OR;  Service: Orthopedics;  Laterality: Right;   AMPUTATION TOE Left    AV FISTULA PLACEMENT Left 10/19/2023   Procedure: LEFT BRACHIOCEPHALIC ARTERIOVENOUS (AV) FISTULA CREATION;  Surgeon: Philipp Brawn, MD;  Location: [facility] OR;  Service: Vascular;  Laterality: Left;   AV FISTULA PLACEMENT Left 01/26/2024   Procedure: LEFT ARTERIOVENOUS (AV) FISTULA CREATION;  Surgeon: Philipp Brawn, MD;  Location: [facility] OR;  Service: Vascular;  Laterality: Left;   BIOPSY  09/02/2021   Procedure: BIOPSY;  Surgeon: Daina Drum, MD;  Location: [facility] ENDOSCOPY;  Service: Gastroenterology;;   BIOPSY  02/18/2023   Procedure: BIOPSY;  Surgeon: Ace Holder, MD;   Location: [facility] ENDOSCOPY;  Service: Gastroenterology;;   COLONOSCOPY  05/11/2019   COLONOSCOPY  2021   3 polyps - tubular adenomas   ESOPHAGOGASTRODUODENOSCOPY (EGD) WITH PROPOFOL  N/A 09/02/2021   Procedure: ESOPHAGOGASTRODUODENOSCOPY (EGD) WITH PROPOFOL ;  Surgeon: Daina Drum, MD;  Location: [facility] ENDOSCOPY;  Service: Gastroenterology;  Laterality: N/A;   ESOPHAGOGASTRODUODENOSCOPY (EGD) WITH PROPOFOL  N/A 02/18/2023   Procedure: ESOPHAGOGASTRODUODENOSCOPY (EGD) WITH PROPOFOL ;  Surgeon: Ace Holder, MD;  Location: [facility] ENDOSCOPY;  Service: Gastroenterology;  Laterality: N/A;   INTRAMEDULLARY (IM) NAIL INTERTROCHANTERIC Right 10/15/2023   Procedure: INTRAMEDULLARY (IM) NAIL INTERTROCHANTERIC;  Surgeon: Laneta Pintos, MD;  Location: [facility] OR;  Service: Orthopedics;  Laterality: Right;   IR FLUORO GUIDE CV LINE RIGHT  10/15/2023   IR US  GUIDE VASC ACCESS RIGHT  10/15/2023   POLYPECTOMY     WISDOM TOOTH EXTRACTION       Objective:  Med list reviewed  Current Outpatient Medications:    Accu-Chek Softclix Lancets lancets, Use to check blood sugar twice per day. E11.9, Disp: 100 each, Rfl: 12   acetaminophen  (TYLENOL ) 500 MG tablet, Take 2 tablets (1,000 mg total) by mouth every 6 (six) hours as needed., Disp: 30 tablet, Rfl: 0   albuterol  (VENTOLIN  HFA) 108 (90 Base) MCG/ACT inhaler, Inhale 2 puffs into the lungs every 6 (six) hours  as needed for wheezing or shortness of breath., Disp: 8 g, Rfl: 2   atorvastatin  (LIPITOR) 40 MG tablet, Take 1 tablet (40 mg total) by mouth at bedtime., Disp: 90 tablet, Rfl: 0   Blood Glucose Monitoring Suppl (ACCU-CHEK GUIDE ME) w/Device KIT, See admin instructions., Disp: , Rfl:    calcitRIOL  (ROCALTROL ) 0.25 MCG capsule, Take 1 capsule (0.25 mcg total) by mouth Every Tuesday,Thursday,and Saturday with dialysis., Disp: 30 capsule, Rfl: 0   diltiazem  (CARDIZEM  CD) 300 MG 24 hr capsule, TAKE 1 CAPSULE(300 MG) BY MOUTH DAILY, Disp: 90 capsule, Rfl: 0    FLUoxetine  (PROZAC ) 20 MG capsule, TAKE 1 CAPSULE(20 MG) BY MOUTH DAILY, Disp: 30 capsule, Rfl: 0   gabapentin  (NEURONTIN ) 300 MG capsule, TAKE 1 CAPSULE(300 MG) BY MOUTH DAILY, Disp: 30 capsule, Rfl: 0   glucose blood (AGAMATRIX PRESTO TEST) test strip, Check blood sugars twice daily before meals, Disp: 100 each, Rfl: 12   glucose blood test strip, 1 each by Other route as needed for other. Use as instructed, Disp: , Rfl:    hydrALAZINE  (APRESOLINE ) 50 MG tablet, Take 1 tablet (50 mg total) by mouth 2 (two) times daily. (Patient taking differently: Take 100 mg by mouth 2 (two) times daily.), Disp: 30 tablet, Rfl: 0   Insulin  Disposable Pump (OMNIPOD DASH PODS, GEN 4,) MISC, Not all the time, Disp: , Rfl:    insulin  lispro (HUMALOG ) 100 UNIT/ML injection, 0-6 Units, Subcutaneous, 3 times daily with meals, First dose on Sun 10/17/23 at 0800 Correction coverage: Very Sensitive (ESRD/Dialysis) CBG < 70: Implement Hypoglycemia Standing Orders and refer to Hypoglycemia Standing Orders sidebar report CBG 70 - 120: 0 units CBG 121 - 150: 0 units CBG 151 - 200: 1 unit CBG 201-250: 2 units CBG 251-300: 3 units CBG 301-350: 4 units CBG 351-400: 5 units CBG > 400: Give 6 units and call MD (Patient taking differently: 0-6 Units, Subcutaneous, 3 times daily with meals, as needed  Correction coverage: Very Sensitive (ESRD/Dialysis) CBG < 70: Implement Hypoglycemia Standing Orders and refer to Hypoglycemia Standing Orders sidebar report CBG 70 - 120: 0 units CBG 121 - 150: 0 units CBG 151 - 200: 1 unit CBG 201-250: 2 units CBG 251-300: 3 units CBG 301-350: 4 units CBG 351-400: 5 units CBG > 400: Give 6 units and call MD), Disp: , Rfl:    lidocaine  (HM LIDOCAINE  PATCH) 4 %, Place 1 patch onto the skin daily., Disp: 30 patch, Rfl: 0   losartan  (COZAAR ) 25 MG tablet, Take 1 tablet (25 mg total) by mouth daily., Disp: 90 tablet, Rfl: 0   metoprolol  succinate (TOPROL -XL) 25 MG 24 hr tablet, Take 25 mg by mouth daily., Disp: ,  Rfl:    ondansetron  (ZOFRAN -ODT) 4 MG disintegrating tablet, Take 1 tablet (4 mg total) by mouth every 8 (eight) hours as needed for nausea or vomiting., Disp: 20 tablet, Rfl: 0   oxyCODONE -acetaminophen  (PERCOCET/ROXICET) 5-325 MG tablet, Take 1 tablet by mouth 3 (three) times daily as needed., Disp: , Rfl:    pantoprazole  (PROTONIX ) 40 MG tablet, Take 1 tablet (40 mg total) by mouth daily., Disp: 90 tablet, Rfl: 0   sevelamer  carbonate (RENVELA ) 800 MG tablet, Take 2 tablets (1,600 mg total) by mouth 3 (three) times daily with meals., Disp: 180 tablet, Rfl: 0   tamsulosin  (FLOMAX ) 0.4 MG CAPS capsule, TAKE 1 CAPSULE(0.4 MG) BY MOUTH DAILY AFTER SUPPER, Disp: 90 capsule, Rfl: 0   tiZANidine  (ZANAFLEX ) 2 MG tablet, Take 1 tablet (2  mg total) by mouth 2 (two) times daily as needed for muscle spasms., Disp: 60 tablet, Rfl: 0   umeclidinium-vilanterol (ANORO ELLIPTA) 62.5-25 MCG/ACT AEPB, Inhale 1 puff into the lungs daily., Disp: 60 each, Rfl: 0   Vital signs in last 24 hrs: Vitals:   04/06/24 1533  BP: (!) 162/84  Pulse: 84   Wt Readings from Last 3 Encounters:  04/06/24 239 lb (108.4 kg)  04/03/24 238 lb 6 oz (108.1 kg)  03/31/24 232 lb 12.8 oz (105.6 kg)    Physical Exam  Ambulatory, uses a walker, gets on exam table independently.  Flat affect HEENT: sclera anicteric, oral mucosa moist without lesions Neck: supple, no thyromegaly, JVD or lymphadenopathy Cardiac: Regular without appreciable murmur,  no peripheral edema Pulm: clear to auscultation bilaterally, normal RR and effort noted Abdomen: soft, no tenderness, with active bowel sounds. No guarding or palpable hepatosplenomegaly.  No distention Skin; warm and dry, no jaundice or rash   Labs:  EKG 01/24/2024 in sinus rhythm, reportedly prolonged QTc of 486     Latest Ref Rng & Units 03/17/2024    6:35 AM 02/16/2024   12:15 PM 01/26/2024    3:29 AM  CBC  WBC 3.4 - 10.8 x10E3/uL  6.5  8.1   Hemoglobin 13.0 - 17.0 g/dL 19.1  9.7   47.8   Hematocrit 39.0 - 52.0 % 33.0  31.1  32.9   Platelets 150 - 450 x10E3/uL  118  97       Latest Ref Rng & Units 03/17/2024    6:35 AM 02/16/2024   12:15 PM 01/26/2024    3:29 AM  CMP  Glucose 70 - 99 mg/dL 295  621  308   BUN 6 - 20 mg/dL 30  21  18    Creatinine 0.61 - 1.24 mg/dL 6.57  8.46  9.62   Sodium 135 - 145 mmol/L 138  136  134   Potassium 3.5 - 5.1 mmol/L 4.7  4.8  4.0   Chloride 98 - 111 mmol/L 101  101  98   CO2 20 - 29 mmol/L  22  28   Calcium  8.7 - 10.2 mg/dL  7.8  6.8    Albumin  2.6   ___________________________________________ Radiologic studies: CLINICAL DATA:  Diffuse colonic wall thickening. RIGHT upper quadrant pain.   EXAM: MRI ABDOMEN WITHOUT CONTRAST  (INCLUDING MRCP)   TECHNIQUE: Multiplanar multisequence MR imaging of the abdomen was performed. Heavily T2-weighted images of the biliary and pancreatic ducts were obtained, and three-dimensional MRCP images were rendered by post processing.   COMPARISON:  CT 11/30/2023   FINDINGS: Lower chest:  Small bilateral pleural effusions   Hepatobiliary: No focal hepatic lesion. No intrahepatic biliary duct dilatation. Common bile duct is normal. There are several dependent gallstones within the gallbladder. No gallbladder inflammation or distension. Is gallstones are small measuring approximately 3 mm each (image 64/series 7)   Pancreas: There is extensive ductal ectasia in side branch cystic dilatation in the tail the pancreas. Findings are similar to mildly progressed from chest MRI 07/03/2022. Largest cluster of cysts measures 2.4 x 2.6 cm in the midbody on image 22/3. No downstream ductal dilatation. No evidence of pancreatic inflammation.   Spleen: Normal spleen.   Adrenals/urinary tract: Enlargement of the LEFT adrenal gland to 4.3 x 2.0 cm (image 37/7. The gland has loss of signal intensity on out of phase opposed phase imaging (image 36/series 702) findings consistent benign LEFT  adrenal adenoma. RIGHT adrenal gland appears normal. Normal  kidneys   Stomach/Bowel: Stomach and limited of the small bowel is unremarkable   Vascular/Lymphatic: Abdominal aortic normal caliber. No retroperitoneal periportal lymphadenopathy.   Musculoskeletal: No aggressive osseous lesion   IMPRESSION: 1. Cholelithiasis without evidence cholecystitis. 2. No biliary duct dilatation. 3. Extensive ductal ectasia and side branch cystic dilatation in the tail the pancreas. Findings are similar to mildly progressed from chest MRI 07/03/2022. 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 Recommend follow-up MRI with contrast in 6 months. This recommendation follows ACR consensus guidelines: Management of Incidental Pancreatic Cysts: A White Paper of the ACR Incidental Findings Committee. J Am Coll Radiol 2017;14:911-923. 4. Benign LEFT adrenal adenoma. 5. Small bilateral pleural effusions.     Electronically Signed   By: Deboraha Fallow M.D.   On: 12/01/2023 13:32  _________________  CLINICAL DATA:  Abdominal pain, bowel obstruction suspected, vomiting.   EXAM: CT ABDOMEN AND PELVIS WITHOUT CONTRAST   TECHNIQUE: Multidetector CT imaging of the abdomen and pelvis was performed following the standard protocol without IV contrast.   RADIATION DOSE REDUCTION: This exam was performed according to the departmental dose-optimization program which includes automated exposure control, adjustment of the mA and/or kV according to patient size and/or use of iterative reconstruction technique.   COMPARISON:  MRI 12/01/2023 and CT abdomen and pelvis 11/30/2023   FINDINGS: Lower chest: No acute abnormality.   Hepatobiliary: Unremarkable liver. Cholelithiasis. No evidence of acute cholecystitis. No biliary dilation.   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   Spleen: Unremarkable.    Adrenals/Urinary Tract: Stable adrenal glands including left adrenal adenoma. No follow-up recommended. No urinary calculi or hydronephrosis. Bladder is unremarkable.   Stomach/Bowel: Normal caliber large and small bowel. Mild wall thickening with pericolonic fat stranding about the ascending colon. Additional mild wall thickening of the transverse and descending colon. The appendix is normal.Stomach is within normal limits.   Vascular/Lymphatic: Aortic atherosclerosis. No enlarged abdominal or pelvic lymph nodes.   Reproductive: Unremarkable.   Other: No free intraperitoneal fluid or air.  Body wall edema.   Musculoskeletal: No acute fracture. Findings rod and screw fixation across a right intertrochanteric fracture. Fracture line remains visible.   IMPRESSION: 1. Nonspecific colitis of the ascending, transverse and descending colon. 2. Cholelithiasis without evidence of acute cholecystitis. 3. Body wall edema. 4. Aortic Atherosclerosis (ICD10-I70.0).     Electronically Signed   By: Rozell Cornet M.D.   On: 01/22/2024 22:15  (Images personally reviewed-H Danis.  No IV contrast, no oral contrast, limits evaluation of reported nonspecific colon wall thickening) ____________________________________________ Other:   _____________________________________________   Encounter Diagnoses  Name Primary?   Gastroesophageal reflux disease without esophagitis Yes   Iron  deficiency anemia due to chronic blood loss    Abnormal finding on GI tract imaging    Nausea and vomiting in adult     Assessment and Plan Assessment & Plan  Nausea and vomiting from upper GI dysmotility related to diabetes.  Currently has vomiting about once a week, which is much improved from before.  He takes Zofran  as needed.  Previous trial of Reglan  was no better than the Zofran .  Of note, he had a somewhat prolonged QTc on most recent EKG, requiring caution with both of those meds.  Pancreatic  cysts Cluster of small pancreatic cysts, stable in size.  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 - Schedule MRI of the pancreas in six months to monitor cysts.  Radiologist had recommended 6 months from the most recent scan, but it had so little if any progression over the 18 months prior to that, I think at 9 to 79-month interval is reasonable.  We set the recall.  Colon polyps Previous colonoscopy revealed precancerous polyps. Current CT scan showed nonspecific thickening of the colon wall, likely an artifact due to lack of contrast. No signs or symptoms of inflammation or colitis. Colonoscopy may be considered in the future for polyp surveillance and will need to be done in a hospital endoscopy lab due to dialysis-related risks. - Reassess the need for colonoscopy in 3 months, considering stability on dialysis and improved diabetes control.  Needs time for fistula maturation  Constipation Bowel movements occur two to three times a week with occasional difficulty. Miralax  suggested to soften stools and improve bowel movements. - Consider using Miralax  powder to improve bowel movements.  Prescription sent     Plan: MRI pancreas in 6 months Office visit with me in 3 months.  If continues stable like now, plan for hospital-based outpatient colonoscopy at next available probably 2 months or so after that.  Would need GoLytely  prep and help from his endocrinologist regarding adjustments of his insulin  regimen for prep and procedure day.  Prescription sent for generic MiraLAX  to use daily.   40 minutes were spent on this encounter, including in depth chart review, independent review of results as outlined above, communicating results with the patient directly, face-to-face time with the patient, coordinating care, ordering studies and medications as appropriate, and documentation.   Extensive records to review since last visit here, I have medical decision making    Kerby Pearson III

## 2024-04-08 DIAGNOSIS — T8249XA Other complication of vascular dialysis catheter, initial encounter: Secondary | ICD-10-CM | POA: Diagnosis not present

## 2024-04-08 DIAGNOSIS — N186 End stage renal disease: Secondary | ICD-10-CM | POA: Diagnosis not present

## 2024-04-08 DIAGNOSIS — D631 Anemia in chronic kidney disease: Secondary | ICD-10-CM | POA: Diagnosis not present

## 2024-04-08 DIAGNOSIS — D689 Coagulation defect, unspecified: Secondary | ICD-10-CM | POA: Diagnosis not present

## 2024-04-08 DIAGNOSIS — D509 Iron deficiency anemia, unspecified: Secondary | ICD-10-CM | POA: Diagnosis not present

## 2024-04-08 DIAGNOSIS — Z992 Dependence on renal dialysis: Secondary | ICD-10-CM | POA: Diagnosis not present

## 2024-04-08 DIAGNOSIS — E1122 Type 2 diabetes mellitus with diabetic chronic kidney disease: Secondary | ICD-10-CM | POA: Diagnosis not present

## 2024-04-08 DIAGNOSIS — N2581 Secondary hyperparathyroidism of renal origin: Secondary | ICD-10-CM | POA: Diagnosis not present

## 2024-04-09 NOTE — Progress Notes (Signed)
    SUBJECTIVE:   CHIEF COMPLAINT / HPI: PNA/COPD exac f/u  Discussed the use of AI scribe software for clinical note transcription with the patient, who gave verbal consent to proceed.  5/1 [facility] visit  Suspected PNA-prescribed Augmentin  and Doxycycline  x 10 days   03/26/24 [facility] ED "CT Noncon Lung-Findings suspicious for right middle lobe pneumonia. There are likely reactive lymph nodes with small bilateral pleural effusions. Pleural effusions could be due to patient's kidney disease. Patient already being treated with antibiotics for likely pneumonia infection.recommend patient continue antibiotic treatment. Patient is afebrile with stable vitals." Flu/RSV/Covid negative  5/9 [facility] visit started on 3-day course of prednisone , albuterol  Flonase /Mucinex   5/12 suspected COPD exacerbation (no formal PFTs), extended prednisone  course to 5 days  History of Present Illness [name] [name] is a 53 year old male who presents with persistent cough and respiratory symptoms.  He experiences persistent coughing without mucus production, hoarseness, and difficulty talking, especially noted last night and this morning. He denies fever, chills, or diarrhea. Shortness of breath occurs at rest and with activity, with a smothered sensation when lying flat, and wheezing disrupts sleep. He uses Anoro Ellipta  and albuterol  inhalers, taking two puffs once daily in the morning, but they do not provide significant relief. He has not used albuterol  at night despite symptoms. He smokes and feels he has inadvertently gotten his mother and sister sick.  Has not had any missed dialysis sessions.  No hemoptysis.  PERTINENT  PMH / PSH: T2DM, ESRD on HD, HTN  OBJECTIVE:   BP (!) 146/72   Pulse 81   Ht 6\' 5"  (1.956 m)   Wt 244 lb 6.4 oz (110.9 kg)   SpO2 99%   BMI 28.98 kg/m   General: Well appearing, NAD, awake, alert, responsive to questions Head: Normocephalic atraumatic CV: Regular rate and rhythm; left arm  fistula with palpable thrill and bruit auscultated, 2+ radial pulses Respiratory: Poor air movement, chest rises symmetrically, no wheezes, no increased work of breathing on room air Abdomen: Soft, non-tender Extremities: Right BKA, left leg without significant swelling  ASSESSMENT/PLAN:   Assessment & Plan Subacute cough Persistent productive cough for four weeks with yellow sputum. Ddx includes possible viral etiology given mom and sister are also sick, could be postviral cough syndrome/postnasal drip.  Other differential includes COPD exacerbation, has already received prednisone  for 5 days and does not have any wheezing on examination-does not have any formal PFTs however does have significant smoking history and does currently smoke so will increase the controller medicine from Anoro to Trelegy. Resistant bacterial infection possible however patient is overall well-appearing.  Did have pleural effusions bilaterally on last CT scan and some smothering sensation, possible component of fluid overload from heart or renal dysfunction however he is getting regular dialysis with no missed sessions.  Would not have effective Lasix  response. -Refer to pulmonology for further evaluation and PFTs -Schedule non-contrast chest CT to assess if there is improvement from prior CT scan-messaged referral coordinator for help with scheduling this -Switch Anoro Ellipta  to Trelegy -Advised can do albuterol  more frequently -Afrin x 3 days followed by Flonase  daily for nasal congestion - strongly encouraged smoking cessation -ED/return precautions discussed Hx of BKA, right (HCC) Patient/shower chair broke and needing replacement. - DME order placed and RN team messaged    Genora Kidd, MD [facility]

## 2024-04-10 ENCOUNTER — Encounter: Payer: Self-pay | Admitting: Student

## 2024-04-10 ENCOUNTER — Ambulatory Visit: Admitting: Student

## 2024-04-10 ENCOUNTER — Telehealth: Payer: Self-pay

## 2024-04-10 VITALS — BP 146/72 | HR 81 | Ht 77.0 in | Wt 244.4 lb

## 2024-04-10 DIAGNOSIS — Z89511 Acquired absence of right leg below knee: Secondary | ICD-10-CM

## 2024-04-10 DIAGNOSIS — E118 Type 2 diabetes mellitus with unspecified complications: Secondary | ICD-10-CM | POA: Diagnosis not present

## 2024-04-10 DIAGNOSIS — R052 Subacute cough: Secondary | ICD-10-CM

## 2024-04-10 DIAGNOSIS — Z794 Long term (current) use of insulin: Secondary | ICD-10-CM | POA: Diagnosis not present

## 2024-04-10 DIAGNOSIS — R0981 Nasal congestion: Secondary | ICD-10-CM | POA: Diagnosis not present

## 2024-04-10 DIAGNOSIS — J189 Pneumonia, unspecified organism: Secondary | ICD-10-CM

## 2024-04-10 MED ORDER — OXYMETAZOLINE HCL 0.05 % NA SOLN: 1.0000 | Freq: Two times a day (BID) | NASAL | 0 refills | Status: AC

## 2024-04-10 MED ORDER — TRELEGY ELLIPTA 100-62.5-25 MCG/ACT IN AEPB: 1.0000 | INHALATION_SPRAY | Freq: Every day | RESPIRATORY_TRACT | 0 refills | Status: DC

## 2024-04-10 MED ORDER — FLUTICASONE PROPIONATE 50 MCG/ACT NA SUSP: 2.0000 | Freq: Every day | NASAL | 6 refills | Status: DC

## 2024-04-10 NOTE — Patient Instructions (Addendum)
 It was great to see you! Thank you for allowing me to participate in your care!   Our plans for today:  - I am ordering a CT scan for follow-up - I am referring you to pulmonology - I am going to escalate your inhaler to trelegy daily (instead of anoro ellipta ) - Return to care if having fevers, worsening shortness of breath, blood in sputum - I have placed order for the shower bench - I have placed orders for Afrin although it may not be covered by your insurance but you can get this over-the-counter you can do 1 spray in each nostril twice a day for 3 days-do not exceed more than 3 days - After the 3 days you can try Flonase  2 puffs daily, aimed towards your ears when you are doing this  Take care and seek immediate care sooner if you develop any concerns.  Genora Kidd, MD

## 2024-04-10 NOTE — Telephone Encounter (Signed)
 Community message sent to [employer] for shower chair. Will await response.   Veronda Prude, RN

## 2024-04-11 ENCOUNTER — Encounter

## 2024-04-11 ENCOUNTER — Telehealth: Payer: Self-pay

## 2024-04-11 DIAGNOSIS — D631 Anemia in chronic kidney disease: Secondary | ICD-10-CM | POA: Diagnosis not present

## 2024-04-11 DIAGNOSIS — D509 Iron deficiency anemia, unspecified: Secondary | ICD-10-CM | POA: Diagnosis not present

## 2024-04-11 DIAGNOSIS — T8249XA Other complication of vascular dialysis catheter, initial encounter: Secondary | ICD-10-CM | POA: Diagnosis not present

## 2024-04-11 DIAGNOSIS — E1122 Type 2 diabetes mellitus with diabetic chronic kidney disease: Secondary | ICD-10-CM | POA: Diagnosis not present

## 2024-04-11 DIAGNOSIS — N186 End stage renal disease: Secondary | ICD-10-CM | POA: Diagnosis not present

## 2024-04-11 DIAGNOSIS — D689 Coagulation defect, unspecified: Secondary | ICD-10-CM | POA: Diagnosis not present

## 2024-04-11 DIAGNOSIS — N2581 Secondary hyperparathyroidism of renal origin: Secondary | ICD-10-CM | POA: Diagnosis not present

## 2024-04-11 DIAGNOSIS — Z992 Dependence on renal dialysis: Secondary | ICD-10-CM | POA: Diagnosis not present

## 2024-04-11 NOTE — Telephone Encounter (Signed)
 Scheduled the patient CT for them. I did call but he did not answer. I did leave him a voice mail with all the information about his appointment.

## 2024-04-11 NOTE — Telephone Encounter (Signed)
-----   Message from [facility] sent at 04/10/2024  4:11 PM EDT ----- Regarding: Orders Plcing orders for noncontrast CT Lung-if able to get in sometime this week that would be helpful Also placing referral to pulmonology, let me know if I needed to be urgent in order for patient to be seen within the next month

## 2024-04-12 ENCOUNTER — Ambulatory Visit (INDEPENDENT_AMBULATORY_CARE_PROVIDER_SITE_OTHER): Admitting: Student in an Organized Health Care Education/Training Program

## 2024-04-12 ENCOUNTER — Ambulatory Visit
Admission: RE | Admit: 2024-04-12 | Discharge: 2024-04-12 | Disposition: A | Discharge status: Home / Self Care | Payer: Self-pay | Source: Ambulatory Visit | Attending: Pulmonary Disease | Admitting: Pulmonary Disease

## 2024-04-12 ENCOUNTER — Other Ambulatory Visit: Payer: Self-pay

## 2024-04-12 ENCOUNTER — Other Ambulatory Visit: Payer: Self-pay | Admitting: Student

## 2024-04-12 ENCOUNTER — Encounter: Payer: Self-pay | Admitting: Student in an Organized Health Care Education/Training Program

## 2024-04-12 ENCOUNTER — Ambulatory Visit: Admitting: Pulmonary Disease

## 2024-04-12 VITALS — BP 150/58 | HR 88 | Temp 97.1°F | Ht 77.0 in | Wt 244.0 lb

## 2024-04-12 DIAGNOSIS — R052 Subacute cough: Secondary | ICD-10-CM

## 2024-04-12 DIAGNOSIS — R0602 Shortness of breath: Secondary | ICD-10-CM | POA: Diagnosis not present

## 2024-04-12 DIAGNOSIS — Z87891 Personal history of nicotine dependence: Secondary | ICD-10-CM | POA: Diagnosis not present

## 2024-04-12 DIAGNOSIS — Z9289 Personal history of other medical treatment: Secondary | ICD-10-CM

## 2024-04-12 DIAGNOSIS — I10 Essential (primary) hypertension: Secondary | ICD-10-CM

## 2024-04-12 NOTE — Progress Notes (Signed)
 Synopsis: Referred in cough by Genora Kidd, MD  Assessment & Plan:   #Shortness of breath (Primary) #Subacute Cough  He is presenting for the evaluation of cough that is around 4 weeks in duration with sputum production.  He did have an upper respiratory infection around the time of symptom onset but his CT chest does show bilateral pleural effusions as well as a possible right middle lobe infiltrate.  The differential for his symptoms includes a postviral cough syndrome, pulmonary edema, and obstructive lung disease (such as COPD).  Point-of-care ultrasound of the bilateral lung bases was performed today and it does not show any pleural effusions. There was an A-line pattern to his lung parenchyma and no B-lines noted. This is all reassuring and argues against volume overload or pulmonary edema as a driver behind his symptoms. There is no wheeze to suggest a COPD exacerbation and further prednisone  is not indicated. That said, he is scheduled for repeat CT scan tomorrow which we will review on follow-up.  I will also obtain a pulmonary function test to assess spirometry for obstruction and evaluate for COPD.  I will also obtain an echocardiogram to assess his RV and LV function for any signs of heart failure.  He is currently maintained on triple inhaler therapy with Trelegy which we will continue pending the pulmonary function test.  - ECHOCARDIOGRAM COMPLETE; Future - Pulmonary Function Test; Future - Continue Trelegy Ellipta    Return in about 4 weeks (around 05/10/2024).  I spent 60 minutes caring for this patient today, including preparing to see the patient, obtaining a medical history , reviewing a separately obtained history, performing a medically appropriate examination and/or evaluation, counseling and educating the patient/family/caregiver, ordering medications, tests, or procedures, documenting clinical information in the electronic health record, and independently interpreting  results (not separately reported/billed) and communicating results to the patient/family/caregiver  Vergia Glasgow, MD [facility]   End of visit medications:  No orders of the defined types were placed in this encounter.    Current Outpatient Medications:    Accu-Chek Softclix Lancets lancets, Use to check blood sugar twice per day. E11.9, Disp: 100 each, Rfl: 12   acetaminophen  (TYLENOL ) 500 MG tablet, Take 2 tablets (1,000 mg total) by mouth every 6 (six) hours as needed., Disp: 30 tablet, Rfl: 0   albuterol  (VENTOLIN  HFA) 108 (90 Base) MCG/ACT inhaler, Inhale 2 puffs into the lungs every 6 (six) hours as needed for wheezing or shortness of breath., Disp: 8 g, Rfl: 2   atorvastatin  (LIPITOR) 40 MG tablet, TAKE 1 TABLET(40 MG) BY MOUTH AT BEDTIME, Disp: 90 tablet, Rfl: 0   Blood Glucose Monitoring Suppl (ACCU-CHEK GUIDE ME) w/Device KIT, See admin instructions., Disp: , Rfl:    calcitRIOL  (ROCALTROL ) 0.25 MCG capsule, Take 1 capsule (0.25 mcg total) by mouth Every Tuesday,Thursday,and Saturday with dialysis., Disp: 30 capsule, Rfl: 0   diltiazem  (CARDIZEM  CD) 300 MG 24 hr capsule, TAKE 1 CAPSULE(300 MG) BY MOUTH DAILY, Disp: 90 capsule, Rfl: 0   FLUoxetine  (PROZAC ) 20 MG capsule, TAKE 1 CAPSULE(20 MG) BY MOUTH DAILY, Disp: 30 capsule, Rfl: 0   fluticasone  (FLONASE ) 50 MCG/ACT nasal spray, Place 2 sprays into both nostrils daily., Disp: 16 g, Rfl: 6   Fluticasone -Umeclidin-Vilant (TRELEGY ELLIPTA ) 100-62.5-25 MCG/ACT AEPB, Inhale 1 puff into the lungs daily., Disp: 28 each, Rfl: 0   gabapentin  (NEURONTIN ) 300 MG capsule, TAKE 1 CAPSULE(300 MG) BY MOUTH DAILY, Disp: 30 capsule, Rfl: 0   glucose blood (AGAMATRIX PRESTO  TEST) test strip, Check blood sugars twice daily before meals, Disp: 100 each, Rfl: 12   glucose blood test strip, 1 each by Other route as needed for other. Use as instructed, Disp: , Rfl:    hydrALAZINE  (APRESOLINE ) 50 MG tablet, Take 1 tablet (50 mg  total) by mouth 2 (two) times daily. (Patient taking differently: Take 100 mg by mouth 2 (two) times daily.), Disp: 30 tablet, Rfl: 0   Insulin  Disposable Pump (OMNIPOD DASH PODS, GEN 4,) MISC, Not all the time, Disp: , Rfl:    insulin  lispro (HUMALOG ) 100 UNIT/ML injection, 0-6 Units, Subcutaneous, 3 times daily with meals, First dose on Sun 10/17/23 at 0800 Correction coverage: Very Sensitive (ESRD/Dialysis) CBG < 70: Implement Hypoglycemia Standing Orders and refer to Hypoglycemia Standing Orders sidebar report CBG 70 - 120: 0 units CBG 121 - 150: 0 units CBG 151 - 200: 1 unit CBG 201-250: 2 units CBG 251-300: 3 units CBG 301-350: 4 units CBG 351-400: 5 units CBG > 400: Give 6 units and call MD (Patient taking differently: 0-6 Units, Subcutaneous, 3 times daily with meals, as needed  Correction coverage: Very Sensitive (ESRD/Dialysis) CBG < 70: Implement Hypoglycemia Standing Orders and refer to Hypoglycemia Standing Orders sidebar report CBG 70 - 120: 0 units CBG 121 - 150: 0 units CBG 151 - 200: 1 unit CBG 201-250: 2 units CBG 251-300: 3 units CBG 301-350: 4 units CBG 351-400: 5 units CBG > 400: Give 6 units and call MD), Disp: , Rfl:    lidocaine  (HM LIDOCAINE  PATCH) 4 %, Place 1 patch onto the skin daily., Disp: 30 patch, Rfl: 0   losartan  (COZAAR ) 25 MG tablet, Take 1 tablet (25 mg total) by mouth daily., Disp: 90 tablet, Rfl: 0   metoprolol  succinate (TOPROL -XL) 25 MG 24 hr tablet, Take 25 mg by mouth daily., Disp: , Rfl:    ondansetron  (ZOFRAN -ODT) 4 MG disintegrating tablet, Take 1 tablet (4 mg total) by mouth every 8 (eight) hours as needed for nausea or vomiting., Disp: 20 tablet, Rfl: 0   oxyCODONE -acetaminophen  (PERCOCET/ROXICET) 5-325 MG tablet, Take 1 tablet by mouth 3 (three) times daily as needed., Disp: , Rfl:    oxymetazoline  (AFRIN NASAL SPRAY) 0.05 % nasal spray, Place 1 spray into both nostrils 2 (two) times daily for 3 days., Disp: 6 mL, Rfl: 0   pantoprazole  (PROTONIX ) 40 MG  tablet, Take 1 tablet (40 mg total) by mouth daily., Disp: 90 tablet, Rfl: 0   polyethylene glycol (MIRALAX  / GLYCOLAX ) 17 g packet, Take 17 g by mouth daily., Disp: 30 each, Rfl: 2   sevelamer  carbonate (RENVELA ) 800 MG tablet, Take 2 tablets (1,600 mg total) by mouth 3 (three) times daily with meals., Disp: 180 tablet, Rfl: 0   tamsulosin  (FLOMAX ) 0.4 MG CAPS capsule, TAKE 1 CAPSULE(0.4 MG) BY MOUTH DAILY AFTER SUPPER, Disp: 90 capsule, Rfl: 0   tiZANidine  (ZANAFLEX ) 2 MG tablet, Take 1 tablet (2 mg total) by mouth 2 (two) times daily as needed for muscle spasms., Disp: 60 tablet, Rfl: 0   Subjective:   PATIENT ID: [name] [name] GENDER: male DOB: [birth year], MRN: [id number]  Chief Complaint  Patient presents with   Consult    Pneumonia x4 weeks ago. Cough with yellow sputum. SOB. Wheezing.     HPI  Patient is a pleasant 53 year old male with a past medical history of end-stage renal disease on hemodialysis who presents to clinic for the evaluation of cough.  Patient's symptoms started late in  April following an upper respiratory tract infection.  He had been seen in early May at a local ED given persistence of symptoms where a CT scan of the chest was performed and showed bilateral pleural effusions.  He has been seen by his primary care physician and prescribed antibiotics as well as steroids without much improvement.  He has received 2 courses of prednisone  as well as a course of Augmentin  without much improvement.  He was previously on Anoro Ellipta  that was switched to Trelegy again without much difference in symptoms.  Symptoms are new and he has not had symptoms like this in the past. Symptom onset was late in April.  He currently reports a cough that is productive of yellowish sputum with associated exertional dyspnea.  He occasionally notices a wheeze.  He does not have any chest pain or chest tightness, has not had any fevers recently, nor has he had any night sweats.  He does  report increased lower extremity edema but does not associate this with the symptoms.  He is compliant with dialysis and receives it 3 times a week.  He does not have any signs of aspiration with food intake.  Patient reports history of smoking around half pack a day for the past 35 years.  He continues to smoke.  He is currently on disability but previously worked multiple jobs including driving a truck, working at [employer], and working as a delivery person.  Ancillary information including prior medications, full medical/surgical/family/social histories, and PFTs (when available) are listed below and have been reviewed.   Review of Systems  Constitutional:  Negative for chills, fever and weight loss.  Respiratory:  Positive for cough, sputum production and wheezing. Negative for hemoptysis and shortness of breath.   Cardiovascular:  Negative for chest pain.     Objective:   Vitals:   04/12/24 1530  BP: (!) 150/58  Pulse: 88  Temp: (!) 97.1 F (36.2 C)  SpO2: 97%  Weight: 244 lb (110.7 kg)  Height: 6\' 5"  (1.956 m)   97% on RA  BMI Readings from Last 3 Encounters:  04/12/24 28.93 kg/m  04/10/24 28.98 kg/m  04/06/24 28.34 kg/m   Wt Readings from Last 3 Encounters:  04/12/24 244 lb (110.7 kg)  04/10/24 244 lb 6.4 oz (110.9 kg)  04/06/24 239 lb (108.4 kg)    Physical Exam Constitutional:      Appearance: Normal appearance.  Cardiovascular:     Rate and Rhythm: Normal rate and regular rhythm.     Pulses: Normal pulses.     Heart sounds: Normal heart sounds.  Pulmonary:     Effort: Pulmonary effort is normal.     Breath sounds: Normal breath sounds.  Abdominal:     Palpations: Abdomen is soft.  Musculoskeletal:     Left lower leg: Edema present.     Comments: Right BKA  Neurological:     General: No focal deficit present.     Mental Status: He is alert and oriented to person, place, and time. Mental status is at baseline.       Ancillary Information     Past Medical History:  Diagnosis Date   Abdominal pain 11/30/2023   Abdominal tenderness 10/14/2023   Allergy    seasonal and environmental   Anemia    Chest pain 12/01/2023   Chronic constipation 05/13/2020   Depression    Diabetes mellitus without complication (HCC) 2019   Dyspnea    hx - SOB with exertion  ESRD on hemodialysis (HCC)    Tues, Thurs, Sat   GERD (gastroesophageal reflux disease)    Hyperlipidemia    Hypertension    Necrotizing fasciitis (HCC) 10/13/2017   Neuromuscular disorder (HCC)    neuropathy   PAD (peripheral artery disease) (HCC)    Secondary hyperparathyroidism (HCC)    SVT (supraventricular tachycardia) (HCC)    hx   Wound dehiscence 11/24/2017     Family History  Problem Relation Age of Onset   Diabetes Mellitus II Mother    Depression Mother    Colon polyps Mother    Hypertension Mother    High Cholesterol Mother    Heart disease Father        CABG x 4.  04/2017   Diabetes Father    High Cholesterol Father    Hypertension Father    Heart disease Maternal Grandmother    Heart disease Maternal Grandfather    Colon cancer Neg Hx    Esophageal cancer Neg Hx    Liver cancer Neg Hx    Pancreatic cancer Neg Hx    Rectal cancer Neg Hx    Stomach cancer Neg Hx      Past Surgical History:  Procedure Laterality Date   A/V FISTULAGRAM N/A 03/17/2024   Procedure: A/V Fistulagram;  Surgeon: Carlene Che, MD;  Location: [facility] INVASIVE [facility] LAB;  Service: Cardiovascular;  Laterality: N/A;   AMPUTATION Right 10/13/2017   Procedure: RIGHT BELOW KNEE AMPUTATION;  Surgeon: Timothy Ford, MD;  Location: [facility] OR;  Service: Orthopedics;  Laterality: Right;   AMPUTATION Right 11/24/2017   Procedure: AMPUTATION BELOW KNEE REVISION;  Surgeon: Timothy Ford, MD;  Location: [facility] OR;  Service: Orthopedics;  Laterality: Right;   AMPUTATION TOE Left    AV FISTULA PLACEMENT Left 10/19/2023   Procedure: LEFT BRACHIOCEPHALIC ARTERIOVENOUS (AV) FISTULA CREATION;   Surgeon: Philipp Brawn, MD;  Location: [facility] OR;  Service: Vascular;  Laterality: Left;   AV FISTULA PLACEMENT Left 01/26/2024   Procedure: LEFT ARTERIOVENOUS (AV) FISTULA CREATION;  Surgeon: Philipp Brawn, MD;  Location: [facility] OR;  Service: Vascular;  Laterality: Left;   BIOPSY  09/02/2021   Procedure: BIOPSY;  Surgeon: Daina Drum, MD;  Location: [facility] ENDOSCOPY;  Service: Gastroenterology;;   BIOPSY  02/18/2023   Procedure: BIOPSY;  Surgeon: Ace Holder, MD;  Location: [facility] ENDOSCOPY;  Service: Gastroenterology;;   COLONOSCOPY  05/11/2019   COLONOSCOPY  2021   3 polyps - tubular adenomas   ESOPHAGOGASTRODUODENOSCOPY (EGD) WITH PROPOFOL  N/A 09/02/2021   Procedure: ESOPHAGOGASTRODUODENOSCOPY (EGD) WITH PROPOFOL ;  Surgeon: Daina Drum, MD;  Location: [facility] ENDOSCOPY;  Service: Gastroenterology;  Laterality: N/A;   ESOPHAGOGASTRODUODENOSCOPY (EGD) WITH PROPOFOL  N/A 02/18/2023   Procedure: ESOPHAGOGASTRODUODENOSCOPY (EGD) WITH PROPOFOL ;  Surgeon: Ace Holder, MD;  Location: [facility] ENDOSCOPY;  Service: Gastroenterology;  Laterality: N/A;   INTRAMEDULLARY (IM) NAIL INTERTROCHANTERIC Right 10/15/2023   Procedure: INTRAMEDULLARY (IM) NAIL INTERTROCHANTERIC;  Surgeon: Laneta Pintos, MD;  Location: [facility] OR;  Service: Orthopedics;  Laterality: Right;   IR FLUORO GUIDE CV LINE RIGHT  10/15/2023   IR US  GUIDE VASC ACCESS RIGHT  10/15/2023   POLYPECTOMY     WISDOM TOOTH EXTRACTION      Social History   Socioeconomic History   Marital status: Divorced    Spouse name: Not on file   Number of children: 1   Years of education: 12   Highest education level: Associate degree: occupational, Scientist, product/process development, or vocational program  Occupational History  Occupation: unemployed  Tobacco Use   Smoking status: Every Day    Current packs/day: 0.50    Average packs/day: 0.5 packs/day for 35.9 years (17.9 ttl pk-yrs)    Types: Cigarettes    Start date: 11/23/1988   Smokeless tobacco: Never   Tobacco  comments:    0.5 PPD- khj 04/12/2024        Started smoking at 53 years old    Smoked 1PPD at his heaviest  Vaping Use   Vaping status: Never Used  Substance and Sexual Activity   Alcohol use: No   Drug use: No   Sexual activity: Not Currently  Other Topics Concern   Not on file  Social History Narrative   Originally from  [place].   Is living with his mother, who essentially supports him now.   Social Drivers of Health   Financial Resource Strain: High Risk (10/30/2023)   Overall Financial Resource Strain (CARDIA)    Difficulty of Paying Living Expenses: Very hard  Food Insecurity: Patient Declined (01/23/2024)   Hunger Vital Sign    Worried About Running Out of Food in the Last Year: Patient declined    Ran Out of Food in the Last Year: Patient declined  Recent Concern: Food Insecurity - Food Insecurity Present (12/01/2023)   Hunger Vital Sign    Worried About Running Out of Food in the Last Year: Never true    Ran Out of Food in the Last Year: Sometimes true  Transportation Needs: Patient Declined (01/23/2024)   PRAPARE - Administrator, Civil Service (Medical): Patient declined    Lack of Transportation (Non-Medical): Patient declined  Physical Activity: Unknown (10/30/2023)   Exercise Vital Sign    Days of Exercise per Week: Patient declined    Minutes of Exercise per Session: 0 min  Stress: Stress Concern Present (10/30/2023)   [employer] of Occupational Health - Occupational Stress Questionnaire    Feeling of Stress : To some extent  Social Connections: Patient Declined (01/23/2024)   Social Connection and Isolation Panel [NHANES]    Frequency of Communication with Friends and Family: Patient declined    Frequency of Social Gatherings with Friends and Family: Patient declined    Attends Religious Services: Patient declined    Database administrator or Organizations: Patient declined    Attends Banker Meetings: Patient declined    Marital  Status: Patient declined  Recent Concern: Social Connections - Socially Isolated (01/23/2024)   Social Connection and Isolation Panel [NHANES]    Frequency of Communication with Friends and Family: More than three times a week    Frequency of Social Gatherings with Friends and Family: Never    Attends Religious Services: Never    Database administrator or Organizations: No    Attends Banker Meetings: Never    Marital Status: Never married  Intimate Partner Violence: Not At Risk (03/26/2024)   Received from [facility]   HITS    Over the last 12 months how often did your partner physically hurt you?: Never    Over the last 12 months how often did your partner insult you or talk down to you?: Never    Over the last 12 months how often did your partner threaten you with physical harm?: Never    Over the last 12 months how often did your partner scream or curse at you?: Never     Allergies  Allergen Reactions   Iron  Nausea And Vomiting  With the iron  tablets   Tape Rash    Rash at site of tape--okay to use paper tape     CBC    Component Value Date/Time   WBC 6.5 02/16/2024 1215   WBC 8.1 01/26/2024 0329   RBC 3.57 (L) 02/16/2024 1215   RBC 3.96 (L) 01/26/2024 0329   HGB 11.2 (L) 03/17/2024 0635   HGB 9.7 (L) 02/16/2024 1215   HCT 33.0 (L) 03/17/2024 0635   HCT 31.1 (L) 02/16/2024 1215   PLT 118 (L) 02/16/2024 1215   MCV 87 02/16/2024 1215   MCH 27.2 02/16/2024 1215   MCH 26.5 01/26/2024 0329   MCHC 31.2 (L) 02/16/2024 1215   MCHC 31.9 01/26/2024 0329   RDW 16.7 (H) 02/16/2024 1215   LYMPHSABS 0.9 01/22/2024 2003   LYMPHSABS 2.7 01/21/2018 1631   MONOABS 0.4 01/22/2024 2003   EOSABS 0.1 01/22/2024 2003   EOSABS 0.6 (H) 01/21/2018 1631   BASOSABS 0.0 01/22/2024 2003   BASOSABS 0.0 01/21/2018 1631    Pulmonary Functions Testing Results:     No data to display          Outpatient Medications Prior to Visit  Medication Sig Dispense Refill    Accu-Chek Softclix Lancets lancets Use to check blood sugar twice per day. E11.9 100 each 12   acetaminophen  (TYLENOL ) 500 MG tablet Take 2 tablets (1,000 mg total) by mouth every 6 (six) hours as needed. 30 tablet 0   albuterol  (VENTOLIN  HFA) 108 (90 Base) MCG/ACT inhaler Inhale 2 puffs into the lungs every 6 (six) hours as needed for wheezing or shortness of breath. 8 g 2   atorvastatin  (LIPITOR) 40 MG tablet TAKE 1 TABLET(40 MG) BY MOUTH AT BEDTIME 90 tablet 0   Blood Glucose Monitoring Suppl (ACCU-CHEK GUIDE ME) w/Device KIT See admin instructions.     calcitRIOL  (ROCALTROL ) 0.25 MCG capsule Take 1 capsule (0.25 mcg total) by mouth Every Tuesday,Thursday,and Saturday with dialysis. 30 capsule 0   diltiazem  (CARDIZEM  CD) 300 MG 24 hr capsule TAKE 1 CAPSULE(300 MG) BY MOUTH DAILY 90 capsule 0   FLUoxetine  (PROZAC ) 20 MG capsule TAKE 1 CAPSULE(20 MG) BY MOUTH DAILY 30 capsule 0   fluticasone  (FLONASE ) 50 MCG/ACT nasal spray Place 2 sprays into both nostrils daily. 16 g 6   Fluticasone -Umeclidin-Vilant (TRELEGY ELLIPTA ) 100-62.5-25 MCG/ACT AEPB Inhale 1 puff into the lungs daily. 28 each 0   gabapentin  (NEURONTIN ) 300 MG capsule TAKE 1 CAPSULE(300 MG) BY MOUTH DAILY 30 capsule 0   glucose blood (AGAMATRIX PRESTO TEST) test strip Check blood sugars twice daily before meals 100 each 12   glucose blood test strip 1 each by Other route as needed for other. Use as instructed     hydrALAZINE  (APRESOLINE ) 50 MG tablet Take 1 tablet (50 mg total) by mouth 2 (two) times daily. (Patient taking differently: Take 100 mg by mouth 2 (two) times daily.) 30 tablet 0   Insulin  Disposable Pump (OMNIPOD DASH PODS, GEN 4,) MISC Not all the time     insulin  lispro (HUMALOG ) 100 UNIT/ML injection 0-6 Units, Subcutaneous, 3 times daily with meals, First dose on Sun 10/17/23 at 0800 Correction coverage: Very Sensitive (ESRD/Dialysis) CBG < 70: Implement Hypoglycemia Standing Orders and refer to Hypoglycemia Standing Orders  sidebar report CBG 70 - 120: 0 units CBG 121 - 150: 0 units CBG 151 - 200: 1 unit CBG 201-250: 2 units CBG 251-300: 3 units CBG 301-350: 4 units CBG 351-400: 5 units CBG > 400: Give  6 units and call MD (Patient taking differently: 0-6 Units, Subcutaneous, 3 times daily with meals, as needed  Correction coverage: Very Sensitive (ESRD/Dialysis) CBG < 70: Implement Hypoglycemia Standing Orders and refer to Hypoglycemia Standing Orders sidebar report CBG 70 - 120: 0 units CBG 121 - 150: 0 units CBG 151 - 200: 1 unit CBG 201-250: 2 units CBG 251-300: 3 units CBG 301-350: 4 units CBG 351-400: 5 units CBG > 400: Give 6 units and call MD)     lidocaine  (HM LIDOCAINE  PATCH) 4 % Place 1 patch onto the skin daily. 30 patch 0   losartan  (COZAAR ) 25 MG tablet Take 1 tablet (25 mg total) by mouth daily. 90 tablet 0   metoprolol  succinate (TOPROL -XL) 25 MG 24 hr tablet Take 25 mg by mouth daily.     ondansetron  (ZOFRAN -ODT) 4 MG disintegrating tablet Take 1 tablet (4 mg total) by mouth every 8 (eight) hours as needed for nausea or vomiting. 20 tablet 0   oxyCODONE -acetaminophen  (PERCOCET/ROXICET) 5-325 MG tablet Take 1 tablet by mouth 3 (three) times daily as needed.     oxymetazoline  (AFRIN NASAL SPRAY) 0.05 % nasal spray Place 1 spray into both nostrils 2 (two) times daily for 3 days. 6 mL 0   pantoprazole  (PROTONIX ) 40 MG tablet Take 1 tablet (40 mg total) by mouth daily. 90 tablet 0   polyethylene glycol (MIRALAX  / GLYCOLAX ) 17 g packet Take 17 g by mouth daily. 30 each 2   sevelamer  carbonate (RENVELA ) 800 MG tablet Take 2 tablets (1,600 mg total) by mouth 3 (three) times daily with meals. 180 tablet 0   tamsulosin  (FLOMAX ) 0.4 MG CAPS capsule TAKE 1 CAPSULE(0.4 MG) BY MOUTH DAILY AFTER SUPPER 90 capsule 0   tiZANidine  (ZANAFLEX ) 2 MG tablet Take 1 tablet (2 mg total) by mouth 2 (two) times daily as needed for muscle spasms. 60 tablet 0   No facility-administered medications prior to visit.

## 2024-04-13 ENCOUNTER — Ambulatory Visit (HOSPITAL_COMMUNITY)
Admission: RE | Admit: 2024-04-13 | Discharge: 2024-04-13 | Disposition: A | Discharge status: Home / Self Care | Source: Ambulatory Visit | Attending: Family Medicine | Admitting: Family Medicine

## 2024-04-13 DIAGNOSIS — J189 Pneumonia, unspecified organism: Secondary | ICD-10-CM | POA: Insufficient documentation

## 2024-04-13 DIAGNOSIS — D631 Anemia in chronic kidney disease: Secondary | ICD-10-CM | POA: Diagnosis not present

## 2024-04-13 DIAGNOSIS — D689 Coagulation defect, unspecified: Secondary | ICD-10-CM | POA: Diagnosis not present

## 2024-04-13 DIAGNOSIS — Z992 Dependence on renal dialysis: Secondary | ICD-10-CM | POA: Diagnosis not present

## 2024-04-13 DIAGNOSIS — T8249XA Other complication of vascular dialysis catheter, initial encounter: Secondary | ICD-10-CM | POA: Diagnosis not present

## 2024-04-13 DIAGNOSIS — E1122 Type 2 diabetes mellitus with diabetic chronic kidney disease: Secondary | ICD-10-CM | POA: Diagnosis not present

## 2024-04-13 DIAGNOSIS — R052 Subacute cough: Secondary | ICD-10-CM | POA: Diagnosis not present

## 2024-04-13 DIAGNOSIS — R053 Chronic cough: Secondary | ICD-10-CM | POA: Diagnosis not present

## 2024-04-13 DIAGNOSIS — J90 Pleural effusion, not elsewhere classified: Secondary | ICD-10-CM | POA: Diagnosis not present

## 2024-04-13 DIAGNOSIS — D509 Iron deficiency anemia, unspecified: Secondary | ICD-10-CM | POA: Diagnosis not present

## 2024-04-13 DIAGNOSIS — N2581 Secondary hyperparathyroidism of renal origin: Secondary | ICD-10-CM | POA: Diagnosis not present

## 2024-04-13 DIAGNOSIS — N186 End stage renal disease: Secondary | ICD-10-CM | POA: Diagnosis not present

## 2024-04-14 ENCOUNTER — Other Ambulatory Visit: Payer: Self-pay | Admitting: Student

## 2024-04-15 DIAGNOSIS — D509 Iron deficiency anemia, unspecified: Secondary | ICD-10-CM | POA: Diagnosis not present

## 2024-04-15 DIAGNOSIS — D689 Coagulation defect, unspecified: Secondary | ICD-10-CM | POA: Diagnosis not present

## 2024-04-15 DIAGNOSIS — Z992 Dependence on renal dialysis: Secondary | ICD-10-CM | POA: Diagnosis not present

## 2024-04-15 DIAGNOSIS — N2581 Secondary hyperparathyroidism of renal origin: Secondary | ICD-10-CM | POA: Diagnosis not present

## 2024-04-15 DIAGNOSIS — N186 End stage renal disease: Secondary | ICD-10-CM | POA: Diagnosis not present

## 2024-04-15 DIAGNOSIS — T8249XA Other complication of vascular dialysis catheter, initial encounter: Secondary | ICD-10-CM | POA: Diagnosis not present

## 2024-04-15 DIAGNOSIS — E1122 Type 2 diabetes mellitus with diabetic chronic kidney disease: Secondary | ICD-10-CM | POA: Diagnosis not present

## 2024-04-15 DIAGNOSIS — D631 Anemia in chronic kidney disease: Secondary | ICD-10-CM | POA: Diagnosis not present

## 2024-04-18 DIAGNOSIS — D509 Iron deficiency anemia, unspecified: Secondary | ICD-10-CM | POA: Diagnosis not present

## 2024-04-18 DIAGNOSIS — D631 Anemia in chronic kidney disease: Secondary | ICD-10-CM | POA: Diagnosis not present

## 2024-04-18 DIAGNOSIS — D689 Coagulation defect, unspecified: Secondary | ICD-10-CM | POA: Diagnosis not present

## 2024-04-18 DIAGNOSIS — E1122 Type 2 diabetes mellitus with diabetic chronic kidney disease: Secondary | ICD-10-CM | POA: Diagnosis not present

## 2024-04-18 DIAGNOSIS — Z992 Dependence on renal dialysis: Secondary | ICD-10-CM | POA: Diagnosis not present

## 2024-04-18 DIAGNOSIS — N2581 Secondary hyperparathyroidism of renal origin: Secondary | ICD-10-CM | POA: Diagnosis not present

## 2024-04-18 DIAGNOSIS — T8249XA Other complication of vascular dialysis catheter, initial encounter: Secondary | ICD-10-CM | POA: Diagnosis not present

## 2024-04-18 DIAGNOSIS — N186 End stage renal disease: Secondary | ICD-10-CM | POA: Diagnosis not present

## 2024-04-19 ENCOUNTER — Telehealth: Payer: Self-pay

## 2024-04-19 ENCOUNTER — Ambulatory Visit (INDEPENDENT_AMBULATORY_CARE_PROVIDER_SITE_OTHER): Admitting: Student in an Organized Health Care Education/Training Program

## 2024-04-19 ENCOUNTER — Ambulatory Visit: Attending: Vascular Surgery | Admitting: Physician Assistant

## 2024-04-19 VITALS — BP 183/80 | HR 79 | Temp 98.1°F | Wt 244.1 lb

## 2024-04-19 DIAGNOSIS — R0602 Shortness of breath: Secondary | ICD-10-CM | POA: Diagnosis not present

## 2024-04-19 DIAGNOSIS — Z992 Dependence on renal dialysis: Secondary | ICD-10-CM

## 2024-04-19 DIAGNOSIS — N186 End stage renal disease: Secondary | ICD-10-CM

## 2024-04-19 LAB — PULMONARY FUNCTION TEST
DL/VA % pred: 109 %
DL/VA: 4.67 ml/min/mmHg/L
DLCO cor % pred: 80 %
DLCO cor: 28.78 ml/min/mmHg
DLCO unc % pred: 68 %
DLCO unc: 24.44 ml/min/mmHg
FEF 25-75 Post: 2.03 L/s
FEF 25-75 Pre: 3.17 L/s
FEF2575-%Change-Post: -35 %
FEF2575-%Pred-Post: 49 %
FEF2575-%Pred-Pre: 77 %
FEV1-%Change-Post: -7 %
FEV1-%Pred-Post: 68 %
FEV1-%Pred-Pre: 74 %
FEV1-Post: 3.32 L
FEV1-Pre: 3.6 L
FEV1FVC-%Change-Post: 7 %
FEV1FVC-%Pred-Pre: 97 %
FEV6-%Change-Post: -13 %
FEV6-%Pred-Post: 66 %
FEV6-%Pred-Pre: 77 %
FEV6-Post: 4.05 L
FEV6-Pre: 4.68 L
FEV6FVC-%Change-Post: 0 %
FEV6FVC-%Pred-Post: 103 %
FEV6FVC-%Pred-Pre: 102 %
FVC-%Change-Post: -14 %
FVC-%Pred-Post: 64 %
FVC-%Pred-Pre: 75 %
FVC-Post: 4.05 L
FVC-Pre: 4.73 L
Post FEV1/FVC ratio: 82 %
Post FEV6/FVC ratio: 100 %
Pre FEV1/FVC ratio: 76 %
Pre FEV6/FVC Ratio: 99 %
RV % pred: 170 %
RV: 4.15 L
TLC % pred: 77 %
TLC: 6.47 L

## 2024-04-19 NOTE — Telephone Encounter (Signed)
 Copied from CRM [id number]. Topic: Clinical - Lab/Test Results >> Apr 19, 2024  3:36 PM Laurina Popper O wrote: Reason for CRM: patient is calling back from a call he received that might be about test results . Calling cal now  Got left on hold . Please give the patient a call back concerning these results  [phone]

## 2024-04-19 NOTE — Progress Notes (Signed)
 Full PFT completed today. Had some difficulty with coughing throughout test.

## 2024-04-19 NOTE — Telephone Encounter (Signed)
 Contacted patient and advised that I did not see any outgoing calls. Patient advised that Dr. Darnelle Elders has not reviewed PFT results. Patient requesting to be seen ASAP. Patient aware that Dr. Darnelle Elders is in ICU this week. Patient requesting to be seen by another provider. Patient reports persistent cough and sputum.  Please advise.

## 2024-04-19 NOTE — H&P (View-Only) (Signed)
 Office Note     CC:  follow up Requesting Provider:  Genora Kidd, MD  HPI: [name] [name] is a 53 y.o. (1971-04-30) male who presents status post left arm diagnostic fistulogram due to left arm edema.  There was no identifiable outflow stenosis based on venography.  Fortunately patient states that left arm edema has improved.  He underwent for stage basilic vein transposition by Dr. Susi Eric on 01/26/2024.  He denies steal symptoms in his left hand.  He dialyzes via right IJ [facility] on a Tuesday Thursday Saturday schedule [facility] [place].   Past Medical History:  Diagnosis Date   Abdominal pain 11/30/2023   Abdominal tenderness 10/14/2023   Allergy    seasonal and environmental   Anemia    Chest pain 12/01/2023   Chronic constipation 05/13/2020   Depression    Diabetes mellitus without complication (HCC) 2019   Dyspnea    hx - SOB with exertion   ESRD on hemodialysis (HCC)    Tues, Thurs, Sat   GERD (gastroesophageal reflux disease)    Hyperlipidemia    Hypertension    Necrotizing fasciitis (HCC) 10/13/2017   Neuromuscular disorder (HCC)    neuropathy   PAD (peripheral artery disease) (HCC)    Secondary hyperparathyroidism (HCC)    SVT (supraventricular tachycardia) (HCC)    hx   Wound dehiscence 11/24/2017    Past Surgical History:  Procedure Laterality Date   A/V FISTULAGRAM N/A 03/17/2024   Procedure: A/V Fistulagram;  Surgeon: Carlene Che, MD;  Location: [facility] INVASIVE CV [facility];  Service: Cardiovascular;  Laterality: N/A;   AMPUTATION Right 10/13/2017   Procedure: RIGHT BELOW KNEE AMPUTATION;  Surgeon: Timothy Ford, MD;  Location: [facility] OR;  Service: Orthopedics;  Laterality: Right;   AMPUTATION Right 11/24/2017   Procedure: AMPUTATION BELOW KNEE REVISION;  Surgeon: Timothy Ford, MD;  Location: [facility] OR;  Service: Orthopedics;  Laterality: Right;   AMPUTATION TOE Left    AV FISTULA PLACEMENT Left 10/19/2023   Procedure: LEFT  BRACHIOCEPHALIC ARTERIOVENOUS (AV) FISTULA CREATION;  Surgeon: Philipp Brawn, MD;  Location: [facility] OR;  Service: Vascular;  Laterality: Left;   AV FISTULA PLACEMENT Left 01/26/2024   Procedure: LEFT ARTERIOVENOUS (AV) FISTULA CREATION;  Surgeon: Philipp Brawn, MD;  Location: [facility] OR;  Service: Vascular;  Laterality: Left;   BIOPSY  09/02/2021   Procedure: BIOPSY;  Surgeon: Daina Drum, MD;  Location: [facility] ENDOSCOPY;  Service: Gastroenterology;;   BIOPSY  02/18/2023   Procedure: BIOPSY;  Surgeon: Ace Holder, MD;  Location: [facility] ENDOSCOPY;  Service: Gastroenterology;;   COLONOSCOPY  05/11/2019   COLONOSCOPY  2021   3 polyps - tubular adenomas   ESOPHAGOGASTRODUODENOSCOPY (EGD) WITH PROPOFOL  N/A 09/02/2021   Procedure: ESOPHAGOGASTRODUODENOSCOPY (EGD) WITH PROPOFOL ;  Surgeon: Daina Drum, MD;  Location: [facility] ENDOSCOPY;  Service: Gastroenterology;  Laterality: N/A;   ESOPHAGOGASTRODUODENOSCOPY (EGD) WITH PROPOFOL  N/A 02/18/2023   Procedure: ESOPHAGOGASTRODUODENOSCOPY (EGD) WITH PROPOFOL ;  Surgeon: Ace Holder, MD;  Location: [facility] ENDOSCOPY;  Service: Gastroenterology;  Laterality: N/A;   INTRAMEDULLARY (IM) NAIL INTERTROCHANTERIC Right 10/15/2023   Procedure: INTRAMEDULLARY (IM) NAIL INTERTROCHANTERIC;  Surgeon: Laneta Pintos, MD;  Location: [facility] OR;  Service: Orthopedics;  Laterality: Right;   IR FLUORO GUIDE CV LINE RIGHT  10/15/2023   IR US  GUIDE VASC ACCESS RIGHT  10/15/2023   POLYPECTOMY     WISDOM TOOTH EXTRACTION      Social History   Socioeconomic History  Marital status: Divorced    Spouse name: Not on file   Number of children: 1   Years of education: 76   Highest education level: Associate degree: occupational, Scientist, product/process development, or vocational program  Occupational History   Occupation: unemployed  Tobacco Use   Smoking status: Every Day    Current packs/day: 0.50    Average packs/day: 0.5 packs/day for 35.9 years (18.0 ttl pk-yrs)    Types: Cigarettes    Start  date: 11/23/1988   Smokeless tobacco: Never   Tobacco comments:    0.5 PPD- khj 04/12/2024        Started smoking at 53 years old    Smoked 1PPD at his heaviest  Vaping Use   Vaping status: Never Used  Substance and Sexual Activity   Alcohol use: No   Drug use: No   Sexual activity: Not Currently  Other Topics Concern   Not on file  Social History Narrative   Originally from  [place].   Is living with his mother, who essentially supports him now.   Social Drivers of Health   Financial Resource Strain: High Risk (10/30/2023)   Overall Financial Resource Strain (CARDIA)    Difficulty of Paying Living Expenses: Very hard  Food Insecurity: Patient Declined (01/23/2024)   Hunger Vital Sign    Worried About Running Out of Food in the Last Year: Patient declined    Ran Out of Food in the Last Year: Patient declined  Recent Concern: Food Insecurity - Food Insecurity Present (12/01/2023)   Hunger Vital Sign    Worried About Running Out of Food in the Last Year: Never true    Ran Out of Food in the Last Year: Sometimes true  Transportation Needs: Patient Declined (01/23/2024)   PRAPARE - Administrator, Civil Service (Medical): Patient declined    Lack of Transportation (Non-Medical): Patient declined  Physical Activity: Unknown (10/30/2023)   Exercise Vital Sign    Days of Exercise per Week: Patient declined    Minutes of Exercise per Session: 0 min  Stress: Stress Concern Present (10/30/2023)   [employer] of Occupational Health - Occupational Stress Questionnaire    Feeling of Stress : To some extent  Social Connections: Patient Declined (01/23/2024)   Social Connection and Isolation Panel [NHANES]    Frequency of Communication with Friends and Family: Patient declined    Frequency of Social Gatherings with Friends and Family: Patient declined    Attends Religious Services: Patient declined    Database administrator or Organizations: Patient declined    Attends Tax inspector Meetings: Patient declined    Marital Status: Patient declined  Recent Concern: Social Connections - Socially Isolated (01/23/2024)   Social Connection and Isolation Panel [NHANES]    Frequency of Communication with Friends and Family: More than three times a week    Frequency of Social Gatherings with Friends and Family: Never    Attends Religious Services: Never    Database administrator or Organizations: No    Attends Banker Meetings: Never    Marital Status: Never married  Intimate Partner Violence: Not At Risk (03/26/2024)   Received from [facility]   HITS    Over the last 12 months how often did your partner physically hurt you?: Never    Over the last 12 months how often did your partner insult you or talk down to you?: Never    Over the last 12 months how often did your  partner threaten you with physical harm?: Never    Over the last 12 months how often did your partner scream or curse at you?: Never    Family History  Problem Relation Age of Onset   Diabetes Mellitus II Mother    Depression Mother    Colon polyps Mother    Hypertension Mother    High Cholesterol Mother    Heart disease Father        CABG x 4.  04/2017   Diabetes Father    High Cholesterol Father    Hypertension Father    Heart disease Maternal Grandmother    Heart disease Maternal Grandfather    Colon cancer Neg Hx    Esophageal cancer Neg Hx    Liver cancer Neg Hx    Pancreatic cancer Neg Hx    Rectal cancer Neg Hx    Stomach cancer Neg Hx     Current Outpatient Medications  Medication Sig Dispense Refill   Accu-Chek Softclix Lancets lancets Use to check blood sugar twice per day. E11.9 100 each 12   acetaminophen  (TYLENOL ) 500 MG tablet Take 2 tablets (1,000 mg total) by mouth every 6 (six) hours as needed. 30 tablet 0   albuterol  (VENTOLIN  HFA) 108 (90 Base) MCG/ACT inhaler Inhale 2 puffs into the lungs every 6 (six) hours as needed for wheezing or shortness of breath.  8 g 2   atorvastatin  (LIPITOR) 40 MG tablet TAKE 1 TABLET(40 MG) BY MOUTH AT BEDTIME 90 tablet 0   Blood Glucose Monitoring Suppl (ACCU-CHEK GUIDE ME) w/Device KIT See admin instructions.     calcitRIOL  (ROCALTROL ) 0.25 MCG capsule Take 1 capsule (0.25 mcg total) by mouth Every Tuesday,Thursday,and Saturday with dialysis. 30 capsule 0   diltiazem  (CARDIZEM  CD) 300 MG 24 hr capsule TAKE 1 CAPSULE(300 MG) BY MOUTH DAILY 90 capsule 0   FLUoxetine  (PROZAC ) 20 MG capsule TAKE 1 CAPSULE(20 MG) BY MOUTH DAILY 30 capsule 0   fluticasone  (FLONASE ) 50 MCG/ACT nasal spray Place 2 sprays into both nostrils daily. 16 g 6   Fluticasone -Umeclidin-Vilant (TRELEGY ELLIPTA ) 100-62.5-25 MCG/ACT AEPB Inhale 1 puff into the lungs daily. 28 each 0   gabapentin  (NEURONTIN ) 300 MG capsule TAKE 1 CAPSULE(300 MG) BY MOUTH DAILY 30 capsule 0   glucose blood (AGAMATRIX PRESTO TEST) test strip Check blood sugars twice daily before meals 100 each 12   glucose blood test strip 1 each by Other route as needed for other. Use as instructed     hydrALAZINE  (APRESOLINE ) 50 MG tablet Take 1 tablet (50 mg total) by mouth 2 (two) times daily. (Patient taking differently: Take 100 mg by mouth 2 (two) times daily.) 30 tablet 0   Insulin  Disposable Pump (OMNIPOD DASH PODS, GEN 4,) MISC Not all the time     insulin  lispro (HUMALOG ) 100 UNIT/ML injection 0-6 Units, Subcutaneous, 3 times daily with meals, First dose on Sun 10/17/23 at 0800 Correction coverage: Very Sensitive (ESRD/Dialysis) CBG < 70: Implement Hypoglycemia Standing Orders and refer to Hypoglycemia Standing Orders sidebar report CBG 70 - 120: 0 units CBG 121 - 150: 0 units CBG 151 - 200: 1 unit CBG 201-250: 2 units CBG 251-300: 3 units CBG 301-350: 4 units CBG 351-400: 5 units CBG > 400: Give 6 units and call MD (Patient taking differently: 0-6 Units, Subcutaneous, 3 times daily with meals, as needed  Correction coverage: Very Sensitive (ESRD/Dialysis) CBG < 70:  Implement Hypoglycemia Standing Orders and refer to Hypoglycemia Standing Orders sidebar report CBG 70 -  120: 0 units CBG 121 - 150: 0 units CBG 151 - 200: 1 unit CBG 201-250: 2 units CBG 251-300: 3 units CBG 301-350: 4 units CBG 351-400: 5 units CBG > 400: Give 6 units and call MD)     lidocaine  (HM LIDOCAINE  PATCH) 4 % Place 1 patch onto the skin daily. 30 patch 0   losartan  (COZAAR ) 25 MG tablet Take 1 tablet (25 mg total) by mouth daily. 90 tablet 0   metoprolol  succinate (TOPROL -XL) 25 MG 24 hr tablet Take 25 mg by mouth daily.     ondansetron  (ZOFRAN -ODT) 4 MG disintegrating tablet Take 1 tablet (4 mg total) by mouth every 8 (eight) hours as needed for nausea or vomiting. 20 tablet 0   oxyCODONE -acetaminophen  (PERCOCET/ROXICET) 5-325 MG tablet Take 1 tablet by mouth 3 (three) times daily as needed.     pantoprazole  (PROTONIX ) 40 MG tablet Take 1 tablet (40 mg total) by mouth daily. 90 tablet 0   polyethylene glycol (MIRALAX  / GLYCOLAX ) 17 g packet Take 17 g by mouth daily. 30 each 2   sevelamer  carbonate (RENVELA ) 800 MG tablet Take 2 tablets (1,600 mg total) by mouth 3 (three) times daily with meals. 180 tablet 0   tamsulosin  (FLOMAX ) 0.4 MG CAPS capsule TAKE 1 CAPSULE(0.4 MG) BY MOUTH DAILY AFTER SUPPER 90 capsule 0   tiZANidine  (ZANAFLEX ) 2 MG tablet TAKE 1 TABLET(2 MG) BY MOUTH TWICE DAILY AS NEEDED FOR MUSCLE SPASMS 60 tablet 0   No current facility-administered medications for this visit.    Allergies  Allergen Reactions   Iron  Nausea And Vomiting    With the iron  tablets   Tape Rash    Rash at site of tape--okay to use paper tape     REVIEW OF SYSTEMS:   [X]  denotes positive finding, [ ]  denotes negative finding Cardiac  Comments:  Chest pain or chest pressure:    Shortness of breath upon exertion:    Short of breath when lying flat:    Irregular heart rhythm:        Vascular    Pain in calf, thigh, or hip brought on by ambulation:    Pain in feet at night that  wakes you up from your sleep:     Blood clot in your veins:    Leg swelling:         Pulmonary    Oxygen at home:    Productive cough:     Wheezing:         Neurologic    Sudden weakness in arms or legs:     Sudden numbness in arms or legs:     Sudden onset of difficulty speaking or slurred speech:    Temporary loss of vision in one eye:     Problems with dizziness:         Gastrointestinal    Blood in stool:     Vomited blood:         Genitourinary    Burning when urinating:     Blood in urine:        Psychiatric    Major depression:         Hematologic    Bleeding problems:    Problems with blood clotting too easily:        Skin    Rashes or ulcers:        Constitutional    Fever or chills:      PHYSICAL EXAMINATION:  Vitals:   04/19/24 1500  BP: (!) 183/80  Pulse: 79  Temp: 98.1 F (36.7 C)  TempSrc: Temporal  SpO2: 96%  Weight: 244 lb 1.6 oz (110.7 kg)    General:  WDWN in NAD; vital signs documented above Gait: Not observed HENT: WNL, normocephalic Pulmonary: normal non-labored breathing , without Rales, rhonchi,  wheezing Cardiac: regular HR Abdomen: soft, NT, no masses Skin: without rashes Vascular Exam/Pulses: Palpable left radial pulse; palpable thrill throughout basilic vein in the upper arm Extremities: Incisions well-healed; seroma still present at the arterial anastomosis Musculoskeletal: no muscle wasting or atrophy  Neurologic: A&O X 3 Psychiatric:  The pt has Normal affect.   Non-Invasive Vascular Imaging:   Fistulogram negative for outflow stenosis    ASSESSMENT/PLAN:: 53 y.o. 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  Fortunately edema of the left arm has improved on its own.  Plan will be to proceed with second stage of basilic vein transposition with Dr. Susi Eric on a nondialysis day in the near future.  He  will continue HD via right IJ [facility] for now.  Surgery was discussed in detail with the patient and he is agreeable to proceed.   Cordie Deters, PA-C [facility] [phone]  Clinic MD:   Vikki Graves

## 2024-04-19 NOTE — Progress Notes (Signed)
 Office Note     CC:  follow up Requesting Provider:  Genora Kidd, MD  HPI: [name] [name] is a 53 y.o. (1971-04-30) male who presents status post left arm diagnostic fistulogram due to left arm edema.  There was no identifiable outflow stenosis based on venography.  Fortunately patient states that left arm edema has improved.  He underwent for stage basilic vein transposition by Dr. Susi Eric on 01/26/2024.  He denies steal symptoms in his left hand.  He dialyzes via right IJ [facility] on a Tuesday Thursday Saturday schedule [facility] [place].   Past Medical History:  Diagnosis Date   Abdominal pain 11/30/2023   Abdominal tenderness 10/14/2023   Allergy    seasonal and environmental   Anemia    Chest pain 12/01/2023   Chronic constipation 05/13/2020   Depression    Diabetes mellitus without complication (HCC) 2019   Dyspnea    hx - SOB with exertion   ESRD on hemodialysis (HCC)    Tues, Thurs, Sat   GERD (gastroesophageal reflux disease)    Hyperlipidemia    Hypertension    Necrotizing fasciitis (HCC) 10/13/2017   Neuromuscular disorder (HCC)    neuropathy   PAD (peripheral artery disease) (HCC)    Secondary hyperparathyroidism (HCC)    SVT (supraventricular tachycardia) (HCC)    hx   Wound dehiscence 11/24/2017    Past Surgical History:  Procedure Laterality Date   A/V FISTULAGRAM N/A 03/17/2024   Procedure: A/V Fistulagram;  Surgeon: Carlene Che, MD;  Location: [facility] INVASIVE CV LAB;  Service: Cardiovascular;  Laterality: N/A;   AMPUTATION Right 10/13/2017   Procedure: RIGHT BELOW KNEE AMPUTATION;  Surgeon: Timothy Ford, MD;  Location: [facility] OR;  Service: Orthopedics;  Laterality: Right;   AMPUTATION Right 11/24/2017   Procedure: AMPUTATION BELOW KNEE REVISION;  Surgeon: Timothy Ford, MD;  Location: [facility] OR;  Service: Orthopedics;  Laterality: Right;   AMPUTATION TOE Left    AV FISTULA PLACEMENT Left 10/19/2023   Procedure: LEFT  BRACHIOCEPHALIC ARTERIOVENOUS (AV) FISTULA CREATION;  Surgeon: Philipp Brawn, MD;  Location: [facility] OR;  Service: Vascular;  Laterality: Left;   AV FISTULA PLACEMENT Left 01/26/2024   Procedure: LEFT ARTERIOVENOUS (AV) FISTULA CREATION;  Surgeon: Philipp Brawn, MD;  Location: [facility] OR;  Service: Vascular;  Laterality: Left;   BIOPSY  09/02/2021   Procedure: BIOPSY;  Surgeon: Daina Drum, MD;  Location: [facility] ENDOSCOPY;  Service: Gastroenterology;;   BIOPSY  02/18/2023   Procedure: BIOPSY;  Surgeon: Ace Holder, MD;  Location: [facility] ENDOSCOPY;  Service: Gastroenterology;;   COLONOSCOPY  05/11/2019   COLONOSCOPY  2021   3 polyps - tubular adenomas   ESOPHAGOGASTRODUODENOSCOPY (EGD) WITH PROPOFOL  N/A 09/02/2021   Procedure: ESOPHAGOGASTRODUODENOSCOPY (EGD) WITH PROPOFOL ;  Surgeon: Daina Drum, MD;  Location: [facility] ENDOSCOPY;  Service: Gastroenterology;  Laterality: N/A;   ESOPHAGOGASTRODUODENOSCOPY (EGD) WITH PROPOFOL  N/A 02/18/2023   Procedure: ESOPHAGOGASTRODUODENOSCOPY (EGD) WITH PROPOFOL ;  Surgeon: Ace Holder, MD;  Location: [facility] ENDOSCOPY;  Service: Gastroenterology;  Laterality: N/A;   INTRAMEDULLARY (IM) NAIL INTERTROCHANTERIC Right 10/15/2023   Procedure: INTRAMEDULLARY (IM) NAIL INTERTROCHANTERIC;  Surgeon: Laneta Pintos, MD;  Location: [facility] OR;  Service: Orthopedics;  Laterality: Right;   IR FLUORO GUIDE CV LINE RIGHT  10/15/2023   IR US  GUIDE VASC ACCESS RIGHT  10/15/2023   POLYPECTOMY     WISDOM TOOTH EXTRACTION      Social History   Socioeconomic History  Marital status: Divorced    Spouse name: Not on file   Number of children: 1   Years of education: 7   Highest education level: Associate degree: occupational, Scientist, product/process development, or vocational program  Occupational History   Occupation: unemployed  Tobacco Use   Smoking status: Every Day    Current packs/day: 0.50    Average packs/day: 0.5 packs/day for 35.9 years (18.0 ttl pk-yrs)    Types: Cigarettes    Start  date: 11/23/1988   Smokeless tobacco: Never   Tobacco comments:    0.5 PPD- khj 04/12/2024        Started smoking at 53 years old    Smoked 1PPD at his heaviest  Vaping Use   Vaping status: Never Used  Substance and Sexual Activity   Alcohol use: No   Drug use: No   Sexual activity: Not Currently  Other Topics Concern   Not on file  Social History Narrative   Originally from  [place].   Is living with his mother, who essentially supports him now.   Social Drivers of Health   Financial Resource Strain: High Risk (10/30/2023)   Overall Financial Resource Strain (CARDIA)    Difficulty of Paying Living Expenses: Very hard  Food Insecurity: Patient Declined (01/23/2024)   Hunger Vital Sign    Worried About Running Out of Food in the Last Year: Patient declined    Ran Out of Food in the Last Year: Patient declined  Recent Concern: Food Insecurity - Food Insecurity Present (12/01/2023)   Hunger Vital Sign    Worried About Running Out of Food in the Last Year: Never true    Ran Out of Food in the Last Year: Sometimes true  Transportation Needs: Patient Declined (01/23/2024)   PRAPARE - Administrator, Civil Service (Medical): Patient declined    Lack of Transportation (Non-Medical): Patient declined  Physical Activity: Unknown (10/30/2023)   Exercise Vital Sign    Days of Exercise per Week: Patient declined    Minutes of Exercise per Session: 0 min  Stress: Stress Concern Present (10/30/2023)   [employer] of Occupational Health - Occupational Stress Questionnaire    Feeling of Stress : To some extent  Social Connections: Patient Declined (01/23/2024)   Social Connection and Isolation Panel [NHANES]    Frequency of Communication with Friends and Family: Patient declined    Frequency of Social Gatherings with Friends and Family: Patient declined    Attends Religious Services: Patient declined    Database administrator or Organizations: Patient declined    Attends Tax inspector Meetings: Patient declined    Marital Status: Patient declined  Recent Concern: Social Connections - Socially Isolated (01/23/2024)   Social Connection and Isolation Panel [NHANES]    Frequency of Communication with Friends and Family: More than three times a week    Frequency of Social Gatherings with Friends and Family: Never    Attends Religious Services: Never    Database administrator or Organizations: No    Attends Banker Meetings: Never    Marital Status: Never married  Intimate Partner Violence: Not At Risk (03/26/2024)   Received from [facility]   HITS    Over the last 12 months how often did your partner physically hurt you?: Never    Over the last 12 months how often did your partner insult you or talk down to you?: Never    Over the last 12 months how often did your  partner threaten you with physical harm?: Never    Over the last 12 months how often did your partner scream or curse at you?: Never    Family History  Problem Relation Age of Onset   Diabetes Mellitus II Mother    Depression Mother    Colon polyps Mother    Hypertension Mother    High Cholesterol Mother    Heart disease Father        CABG x 4.  04/2017   Diabetes Father    High Cholesterol Father    Hypertension Father    Heart disease Maternal Grandmother    Heart disease Maternal Grandfather    Colon cancer Neg Hx    Esophageal cancer Neg Hx    Liver cancer Neg Hx    Pancreatic cancer Neg Hx    Rectal cancer Neg Hx    Stomach cancer Neg Hx     Current Outpatient Medications  Medication Sig Dispense Refill   Accu-Chek Softclix Lancets lancets Use to check blood sugar twice per day. E11.9 100 each 12   acetaminophen  (TYLENOL ) 500 MG tablet Take 2 tablets (1,000 mg total) by mouth every 6 (six) hours as needed. 30 tablet 0   albuterol  (VENTOLIN  HFA) 108 (90 Base) MCG/ACT inhaler Inhale 2 puffs into the lungs every 6 (six) hours as needed for wheezing or shortness of breath.  8 g 2   atorvastatin  (LIPITOR) 40 MG tablet TAKE 1 TABLET(40 MG) BY MOUTH AT BEDTIME 90 tablet 0   Blood Glucose Monitoring Suppl (ACCU-CHEK GUIDE ME) w/Device KIT See admin instructions.     calcitRIOL  (ROCALTROL ) 0.25 MCG capsule Take 1 capsule (0.25 mcg total) by mouth Every Tuesday,Thursday,and Saturday with dialysis. 30 capsule 0   diltiazem  (CARDIZEM  CD) 300 MG 24 hr capsule TAKE 1 CAPSULE(300 MG) BY MOUTH DAILY 90 capsule 0   FLUoxetine  (PROZAC ) 20 MG capsule TAKE 1 CAPSULE(20 MG) BY MOUTH DAILY 30 capsule 0   fluticasone  (FLONASE ) 50 MCG/ACT nasal spray Place 2 sprays into both nostrils daily. 16 g 6   Fluticasone -Umeclidin-Vilant (TRELEGY ELLIPTA ) 100-62.5-25 MCG/ACT AEPB Inhale 1 puff into the lungs daily. 28 each 0   gabapentin  (NEURONTIN ) 300 MG capsule TAKE 1 CAPSULE(300 MG) BY MOUTH DAILY 30 capsule 0   glucose blood (AGAMATRIX PRESTO TEST) test strip Check blood sugars twice daily before meals 100 each 12   glucose blood test strip 1 each by Other route as needed for other. Use as instructed     hydrALAZINE  (APRESOLINE ) 50 MG tablet Take 1 tablet (50 mg total) by mouth 2 (two) times daily. (Patient taking differently: Take 100 mg by mouth 2 (two) times daily.) 30 tablet 0   Insulin  Disposable Pump (OMNIPOD DASH PODS, GEN 4,) MISC Not all the time     insulin  lispro (HUMALOG ) 100 UNIT/ML injection 0-6 Units, Subcutaneous, 3 times daily with meals, First dose on Sun 10/17/23 at 0800 Correction coverage: Very Sensitive (ESRD/Dialysis) CBG < 70: Implement Hypoglycemia Standing Orders and refer to Hypoglycemia Standing Orders sidebar report CBG 70 - 120: 0 units CBG 121 - 150: 0 units CBG 151 - 200: 1 unit CBG 201-250: 2 units CBG 251-300: 3 units CBG 301-350: 4 units CBG 351-400: 5 units CBG > 400: Give 6 units and call MD (Patient taking differently: 0-6 Units, Subcutaneous, 3 times daily with meals, as needed  Correction coverage: Very Sensitive (ESRD/Dialysis) CBG < 70:  Implement Hypoglycemia Standing Orders and refer to Hypoglycemia Standing Orders sidebar report CBG 70 -  120: 0 units CBG 121 - 150: 0 units CBG 151 - 200: 1 unit CBG 201-250: 2 units CBG 251-300: 3 units CBG 301-350: 4 units CBG 351-400: 5 units CBG > 400: Give 6 units and call MD)     lidocaine  (HM LIDOCAINE  PATCH) 4 % Place 1 patch onto the skin daily. 30 patch 0   losartan  (COZAAR ) 25 MG tablet Take 1 tablet (25 mg total) by mouth daily. 90 tablet 0   metoprolol  succinate (TOPROL -XL) 25 MG 24 hr tablet Take 25 mg by mouth daily.     ondansetron  (ZOFRAN -ODT) 4 MG disintegrating tablet Take 1 tablet (4 mg total) by mouth every 8 (eight) hours as needed for nausea or vomiting. 20 tablet 0   oxyCODONE -acetaminophen  (PERCOCET/ROXICET) 5-325 MG tablet Take 1 tablet by mouth 3 (three) times daily as needed.     pantoprazole  (PROTONIX ) 40 MG tablet Take 1 tablet (40 mg total) by mouth daily. 90 tablet 0   polyethylene glycol (MIRALAX  / GLYCOLAX ) 17 g packet Take 17 g by mouth daily. 30 each 2   sevelamer  carbonate (RENVELA ) 800 MG tablet Take 2 tablets (1,600 mg total) by mouth 3 (three) times daily with meals. 180 tablet 0   tamsulosin  (FLOMAX ) 0.4 MG CAPS capsule TAKE 1 CAPSULE(0.4 MG) BY MOUTH DAILY AFTER SUPPER 90 capsule 0   tiZANidine  (ZANAFLEX ) 2 MG tablet TAKE 1 TABLET(2 MG) BY MOUTH TWICE DAILY AS NEEDED FOR MUSCLE SPASMS 60 tablet 0   No current facility-administered medications for this visit.    Allergies  Allergen Reactions   Iron  Nausea And Vomiting    With the iron  tablets   Tape Rash    Rash at site of tape--okay to use paper tape     REVIEW OF SYSTEMS:   [X]  denotes positive finding, [ ]  denotes negative finding Cardiac  Comments:  Chest pain or chest pressure:    Shortness of breath upon exertion:    Short of breath when lying flat:    Irregular heart rhythm:        Vascular    Pain in calf, thigh, or hip brought on by ambulation:    Pain in feet at night that  wakes you up from your sleep:     Blood clot in your veins:    Leg swelling:         Pulmonary    Oxygen at home:    Productive cough:     Wheezing:         Neurologic    Sudden weakness in arms or legs:     Sudden numbness in arms or legs:     Sudden onset of difficulty speaking or slurred speech:    Temporary loss of vision in one eye:     Problems with dizziness:         Gastrointestinal    Blood in stool:     Vomited blood:         Genitourinary    Burning when urinating:     Blood in urine:        Psychiatric    Major depression:         Hematologic    Bleeding problems:    Problems with blood clotting too easily:        Skin    Rashes or ulcers:        Constitutional    Fever or chills:      PHYSICAL EXAMINATION:  Vitals:   04/19/24 1500  BP: (!) 183/80  Pulse: 79  Temp: 98.1 F (36.7 C)  TempSrc: Temporal  SpO2: 96%  Weight: 244 lb 1.6 oz (110.7 kg)    General:  WDWN in NAD; vital signs documented above Gait: Not observed HENT: WNL, normocephalic Pulmonary: normal non-labored breathing , without Rales, rhonchi,  wheezing Cardiac: regular HR Abdomen: soft, NT, no masses Skin: without rashes Vascular Exam/Pulses: Palpable left radial pulse; palpable thrill throughout basilic vein in the upper arm Extremities: Incisions well-healed; seroma still present at the arterial anastomosis Musculoskeletal: no muscle wasting or atrophy  Neurologic: A&O X 3 Psychiatric:  The pt has Normal affect.   Non-Invasive Vascular Imaging:   Fistulogram negative for outflow stenosis    ASSESSMENT/PLAN:: 53 y.o. 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  Fortunately edema of the left arm has improved on its own.  Plan will be to proceed with second stage of basilic vein transposition with Dr. Susi Eric on a nondialysis day in the near future.  He  will continue HD via right IJ [facility] for now.  Surgery was discussed in detail with the patient and he is agreeable to proceed.   Cordie Deters, PA-C [facility] [phone]  Clinic MD:   Vikki Graves

## 2024-04-19 NOTE — Patient Instructions (Signed)
 Full PFT completed today ? ?

## 2024-04-20 ENCOUNTER — Telehealth: Payer: Self-pay

## 2024-04-20 ENCOUNTER — Other Ambulatory Visit: Payer: Self-pay

## 2024-04-20 DIAGNOSIS — Z992 Dependence on renal dialysis: Secondary | ICD-10-CM | POA: Diagnosis not present

## 2024-04-20 DIAGNOSIS — D689 Coagulation defect, unspecified: Secondary | ICD-10-CM | POA: Diagnosis not present

## 2024-04-20 DIAGNOSIS — D631 Anemia in chronic kidney disease: Secondary | ICD-10-CM | POA: Diagnosis not present

## 2024-04-20 DIAGNOSIS — D509 Iron deficiency anemia, unspecified: Secondary | ICD-10-CM | POA: Diagnosis not present

## 2024-04-20 DIAGNOSIS — N2581 Secondary hyperparathyroidism of renal origin: Secondary | ICD-10-CM | POA: Diagnosis not present

## 2024-04-20 DIAGNOSIS — M25561 Pain in right knee: Secondary | ICD-10-CM | POA: Diagnosis not present

## 2024-04-20 DIAGNOSIS — N186 End stage renal disease: Secondary | ICD-10-CM

## 2024-04-20 DIAGNOSIS — E1122 Type 2 diabetes mellitus with diabetic chronic kidney disease: Secondary | ICD-10-CM | POA: Diagnosis not present

## 2024-04-20 DIAGNOSIS — T8249XA Other complication of vascular dialysis catheter, initial encounter: Secondary | ICD-10-CM | POA: Diagnosis not present

## 2024-04-20 DIAGNOSIS — R262 Difficulty in walking, not elsewhere classified: Secondary | ICD-10-CM | POA: Diagnosis not present

## 2024-04-20 NOTE — Telephone Encounter (Signed)
 Patient scheduled for next surgery w/ Dr. Edgardo Goodwill due to Dr. Susi Eric not in OR on Wednesdays.

## 2024-04-20 NOTE — Telephone Encounter (Signed)
 Attempted to call for surgery scheduling. LVM

## 2024-04-22 DIAGNOSIS — Z992 Dependence on renal dialysis: Secondary | ICD-10-CM | POA: Diagnosis not present

## 2024-04-22 DIAGNOSIS — D631 Anemia in chronic kidney disease: Secondary | ICD-10-CM | POA: Diagnosis not present

## 2024-04-22 DIAGNOSIS — E1122 Type 2 diabetes mellitus with diabetic chronic kidney disease: Secondary | ICD-10-CM | POA: Diagnosis not present

## 2024-04-22 DIAGNOSIS — D509 Iron deficiency anemia, unspecified: Secondary | ICD-10-CM | POA: Diagnosis not present

## 2024-04-22 DIAGNOSIS — N2581 Secondary hyperparathyroidism of renal origin: Secondary | ICD-10-CM | POA: Diagnosis not present

## 2024-04-22 DIAGNOSIS — N186 End stage renal disease: Secondary | ICD-10-CM | POA: Diagnosis not present

## 2024-04-22 DIAGNOSIS — I129 Hypertensive chronic kidney disease with stage 1 through stage 4 chronic kidney disease, or unspecified chronic kidney disease: Secondary | ICD-10-CM | POA: Diagnosis not present

## 2024-04-22 DIAGNOSIS — D689 Coagulation defect, unspecified: Secondary | ICD-10-CM | POA: Diagnosis not present

## 2024-04-22 DIAGNOSIS — T8249XA Other complication of vascular dialysis catheter, initial encounter: Secondary | ICD-10-CM | POA: Diagnosis not present

## 2024-04-23 ENCOUNTER — Other Ambulatory Visit: Payer: Self-pay | Admitting: Student

## 2024-04-25 ENCOUNTER — Encounter (HOSPITAL_COMMUNITY): Payer: Self-pay | Admitting: Vascular Surgery

## 2024-04-25 ENCOUNTER — Other Ambulatory Visit: Payer: Self-pay

## 2024-04-25 ENCOUNTER — Ambulatory Visit: Payer: Self-pay | Admitting: Student

## 2024-04-25 DIAGNOSIS — T8249XA Other complication of vascular dialysis catheter, initial encounter: Secondary | ICD-10-CM | POA: Diagnosis not present

## 2024-04-25 DIAGNOSIS — N2581 Secondary hyperparathyroidism of renal origin: Secondary | ICD-10-CM | POA: Diagnosis not present

## 2024-04-25 DIAGNOSIS — D689 Coagulation defect, unspecified: Secondary | ICD-10-CM | POA: Diagnosis not present

## 2024-04-25 DIAGNOSIS — D631 Anemia in chronic kidney disease: Secondary | ICD-10-CM | POA: Diagnosis not present

## 2024-04-25 DIAGNOSIS — I132 Hypertensive heart and chronic kidney disease with heart failure and with stage 5 chronic kidney disease, or end stage renal disease: Secondary | ICD-10-CM | POA: Diagnosis not present

## 2024-04-25 DIAGNOSIS — N186 End stage renal disease: Secondary | ICD-10-CM | POA: Diagnosis not present

## 2024-04-25 DIAGNOSIS — Z992 Dependence on renal dialysis: Secondary | ICD-10-CM | POA: Diagnosis not present

## 2024-04-25 NOTE — Progress Notes (Signed)
 PCP - Genora Kidd, MD  Cardiologist -   PPM/ICD - denies Device Orders - n/a Rep Notified - n/a  Chest x-ray - Chest Ct 04-13-24 EKG - 01-24-24 Stress Test -  ECHO - 03-23-24 Cardiac Cath -  PFT - 04-19-24    Fasting Blood Sugar - Per patient ranges between 160-180 Checks Blood Sugar Per patient BID. Per patient he is not using the Omipod at this time  Blood Thinner Instructions: denies Aspirin  Instructions: n/a  ERAS Protcol - NPO  COVID TEST- n/a  Anesthesia review: yes, hx of DM, ESRD, Hx SVT  Patient verbally denies any shortness of breath, fever, cough and chest pain during phone call   -------------  SDW INSTRUCTIONS given:  Your procedure is scheduled on April 26 2024.  Report to [facility] Main Entrance "A" at 8:15 A.M., and check in at the Admitting office.  Call this number if you have problems the morning of surgery:  [phone]   Remember:  Do not eat or drink after midnight the night before your surgery      Take these medicines the morning of surgery with A SIP OF WATER  acetaminophen  (TYLENOL )  albuterol  (VENTOLIN  HFA) inhaler  diltiazem  (CARDIZEM  CD)  FLUoxetine  (PROZAC )  fluticasone  (FLONASE )  Fluticasone -Umeclidin-Vilant (TRELEGY ELLIPTA   hydrALAZINE  (APRESOLINE )  metoprolol  succinate (TOPROL -XL)  ondansetron  (ZOFRAN -ODT)  oxyCODONE -acetaminophen  (PERCOCET/ROXICET)  pantoprazole  (PROTONIX )    As of today, STOP taking any Aspirin  (unless otherwise instructed by your surgeon) Aleve, Naproxen, Ibuprofen, Motrin, Advil, Goody's, BC's, all herbal medications, fish oil, and all vitamins.          WHAT DO I DO ABOUT MY DIABETES MEDICATION?   Do not take oral diabetes medicines (pills) the morning of surgery. (OMNIPOD DASH PODS, GEN 4,   THE NIGHT BEFORE SURGERY, take ___________ units of ___________insulin.       THE MORNING OF SURGERY, take _____________ units of __________insulin.  The day of surgery, do not take other diabetes  injectables, including Byetta (exenatide), Bydureon (exenatide ER), Victoza (liraglutide), or Trulicity  (dulaglutide ).  If your CBG is greater than 220 mg/dL, you may take  of your sliding scale (correction) dose of insulin .   HOW TO MANAGE YOUR DIABETES BEFORE AND AFTER SURGERY  Why is it important to control my blood sugar before and after surgery? Improving blood sugar levels before and after surgery helps healing and can limit problems. A way of improving blood sugar control is eating a healthy diet by:  Eating less sugar and carbohydrates  Increasing activity/exercise  Talking with your doctor about reaching your blood sugar goals High blood sugars (greater than 180 mg/dL) can raise your risk of infections and slow your recovery, so you will need to focus on controlling your diabetes during the weeks before surgery. Make sure that the doctor who takes care of your diabetes knows about your planned surgery including the date and location.  How do I manage my blood sugar before surgery? Check your blood sugar at least 4 times a day, starting 2 days before surgery, to make sure that the level is not too high or low.  Check your blood sugar the morning of your surgery when you wake up and every 2 hours until you get to the Short Stay unit.  If your blood sugar is less than 70 mg/dL, you will need to treat for low blood sugar: Do not take insulin . Treat a low blood sugar (less than 70 mg/dL) with  cup of clear juice (cranberry or  apple), 4 glucose tablets, OR glucose gel. Recheck blood sugar in 15 minutes after treatment (to make sure it is greater than 70 mg/dL). If your blood sugar is not greater than 70 mg/dL on recheck, call [phone] for further instructions. Report your blood sugar to the short stay nurse when you get to Short Stay.  If you are admitted to the hospital after surgery: Your blood sugar will be checked by the staff and you will probably be given insulin  after  surgery (instead of oral diabetes medicines) to make sure you have good blood sugar levels. The goal for blood sugar control after surgery is 80-180 mg/dL. \            Do not wear jewelry, make up, or nail polish            Do not wear lotions, powders, perfumes/colognes, or deodorant.            Do not shave 48 hours prior to surgery.  Men may shave face and neck.            Do not bring valuables to the hospital.            [facility] is not responsible for any belongings or valuables.  Do NOT Smoke (Tobacco/Vaping) 24 hours prior to your procedure If you use a CPAP at night, you may bring all equipment for your overnight stay.   Contacts, glasses, dentures or bridgework may not be worn into surgery.      For patients admitted to the hospital, discharge time will be determined by your treatment team.   Patients discharged the day of surgery will not be allowed to drive home, and someone needs to stay with them for 24 hours.    Special instructions:   [facility]- Preparing For Surgery  Before surgery, you can play an important role. Because skin is not sterile, your skin needs to be as free of germs as possible. You can reduce the number of germs on your skin by washing with CHG (chlorahexidine gluconate) Soap before surgery.  CHG is an antiseptic cleaner which kills germs and bonds with the skin to continue killing germs even after washing.    Oral Hygiene is also important to reduce your risk of infection.  Remember - BRUSH YOUR TEETH THE MORNING OF SURGERY WITH YOUR REGULAR TOOTHPASTE  Please do not use if you have an allergy to CHG or antibacterial soaps. If your skin becomes reddened/irritated stop using the CHG.  Do not shave (including legs and underarms) for at least 48 hours prior to first CHG shower. It is OK to shave your face.  Please follow these instructions carefully.   Shower the NIGHT BEFORE SURGERY and the MORNING OF SURGERY with DIAL Soap.   Pat yourself dry  with a CLEAN TOWEL.  Wear CLEAN PAJAMAS to bed the night before surgery  Place CLEAN SHEETS on your bed the night of your first shower and DO NOT SLEEP WITH PETS.   Day of Surgery: Please shower morning of surgery  Wear Clean/Comfortable clothing the morning of surgery Do not apply any deodorants/lotions.   Remember to brush your teeth WITH YOUR REGULAR TOOTHPASTE.   Questions were answered. Patient verbalized understanding of instructions.

## 2024-04-25 NOTE — Telephone Encounter (Signed)
 Lmtcb

## 2024-04-26 ENCOUNTER — Encounter (HOSPITAL_COMMUNITY)
Admission: RE | Disposition: A | Discharge status: Home / Self Care | Payer: Self-pay | Source: Ambulatory Visit | Attending: Vascular Surgery

## 2024-04-26 ENCOUNTER — Ambulatory Visit (HOSPITAL_COMMUNITY)
Admission: RE | Admit: 2024-04-26 | Discharge: 2024-04-26 | Disposition: A | Discharge status: Home / Self Care | Source: Ambulatory Visit | Attending: Vascular Surgery | Admitting: Vascular Surgery

## 2024-04-26 ENCOUNTER — Ambulatory Visit (HOSPITAL_COMMUNITY)

## 2024-04-26 ENCOUNTER — Encounter (HOSPITAL_COMMUNITY): Payer: Self-pay | Admitting: Vascular Surgery

## 2024-04-26 ENCOUNTER — Other Ambulatory Visit: Payer: Self-pay

## 2024-04-26 DIAGNOSIS — I120 Hypertensive chronic kidney disease with stage 5 chronic kidney disease or end stage renal disease: Secondary | ICD-10-CM | POA: Diagnosis not present

## 2024-04-26 DIAGNOSIS — Z794 Long term (current) use of insulin: Secondary | ICD-10-CM | POA: Diagnosis not present

## 2024-04-26 DIAGNOSIS — N186 End stage renal disease: Secondary | ICD-10-CM | POA: Diagnosis not present

## 2024-04-26 DIAGNOSIS — Z992 Dependence on renal dialysis: Secondary | ICD-10-CM

## 2024-04-26 DIAGNOSIS — Z5986 Financial insecurity: Secondary | ICD-10-CM | POA: Insufficient documentation

## 2024-04-26 DIAGNOSIS — E1151 Type 2 diabetes mellitus with diabetic peripheral angiopathy without gangrene: Secondary | ICD-10-CM | POA: Diagnosis not present

## 2024-04-26 DIAGNOSIS — F17210 Nicotine dependence, cigarettes, uncomplicated: Secondary | ICD-10-CM | POA: Diagnosis not present

## 2024-04-26 DIAGNOSIS — E1122 Type 2 diabetes mellitus with diabetic chronic kidney disease: Secondary | ICD-10-CM | POA: Diagnosis not present

## 2024-04-26 HISTORY — PX: BASCILIC VEIN TRANSPOSITION: SHX5742

## 2024-04-26 LAB — POCT I-STAT, CHEM 8
BUN: 26 mg/dL — ABNORMAL HIGH (ref 6–20)
Calcium, Ion: 0.95 mmol/L — ABNORMAL LOW (ref 1.15–1.40)
Chloride: 100 mmol/L (ref 98–111)
Creatinine, Ser: 5.5 mg/dL — ABNORMAL HIGH (ref 0.61–1.24)
Glucose, Bld: 100 mg/dL — ABNORMAL HIGH (ref 70–99)
HCT: 31 % — ABNORMAL LOW (ref 39.0–52.0)
Hemoglobin: 10.5 g/dL — ABNORMAL LOW (ref 13.0–17.0)
Potassium: 4.5 mmol/L (ref 3.5–5.1)
Sodium: 139 mmol/L (ref 135–145)
TCO2: 30 mmol/L (ref 22–32)

## 2024-04-26 LAB — GLUCOSE, CAPILLARY
Glucose-Capillary: 106 mg/dL — ABNORMAL HIGH (ref 70–99)
Glucose-Capillary: 96 mg/dL (ref 70–99)
Glucose-Capillary: 90 mg/dL (ref 70–99)
Glucose-Capillary: 97 mg/dL (ref 70–99)

## 2024-04-26 SURGERY — TRANSPOSITION, VEIN, BASILIC
Anesthesia: General | Site: Arm Upper | Laterality: Left

## 2024-04-26 MED ORDER — CHLORHEXIDINE GLUCONATE 0.12 % MT SOLN
15.0000 mL | Freq: Once | OROMUCOSAL | Status: AC
  Administered 2024-04-26: 15 mL via OROMUCOSAL
  Filled 2024-04-26: qty 15

## 2024-04-26 MED ORDER — HEPARIN SODIUM (PORCINE) 1000 UNIT/ML IJ SOLN
INTRAMUSCULAR | Status: DC | PRN
  Administered 2024-04-26: 5000 [IU] via INTRAVENOUS

## 2024-04-26 MED ORDER — HEPARIN 6000 UNIT IRRIGATION SOLUTION
Status: AC
  Filled 2024-04-26: qty 500

## 2024-04-26 MED ORDER — FENTANYL CITRATE (PF) 250 MCG/5ML IJ SOLN
INTRAMUSCULAR | Status: AC
  Filled 2024-04-26: qty 5

## 2024-04-26 MED ORDER — CHLORHEXIDINE GLUCONATE 4 % EX SOLN: 60.0000 mL | Freq: Once | CUTANEOUS | Status: DC

## 2024-04-26 MED ORDER — HEPARIN 6000 UNIT IRRIGATION SOLUTION
Status: DC | PRN
  Administered 2024-04-26: 1

## 2024-04-26 MED ORDER — HYDROMORPHONE HCL 1 MG/ML IJ SOLN
0.2500 mg | INTRAMUSCULAR | Status: DC | PRN
  Administered 2024-04-26: 0.5 mg via INTRAVENOUS
  Administered 2024-04-26 (×4): 0.25 mg via INTRAVENOUS

## 2024-04-26 MED ORDER — PROPOFOL 10 MG/ML IV BOLUS
INTRAVENOUS | Status: AC
  Filled 2024-04-26: qty 20

## 2024-04-26 MED ORDER — HYDROMORPHONE HCL 1 MG/ML IJ SOLN
INTRAMUSCULAR | Status: AC
  Filled 2024-04-26: qty 1

## 2024-04-26 MED ORDER — DEXAMETHASONE SODIUM PHOSPHATE 10 MG/ML IJ SOLN
INTRAMUSCULAR | Status: DC | PRN
  Administered 2024-04-26: 5 mg via INTRAVENOUS

## 2024-04-26 MED ORDER — 0.9 % SODIUM CHLORIDE (POUR BTL) OPTIME
TOPICAL | Status: DC | PRN
  Administered 2024-04-26: 1000 mL

## 2024-04-26 MED ORDER — SODIUM CHLORIDE 0.9 % IV SOLN: INTRAVENOUS | Status: DC

## 2024-04-26 MED ORDER — LIDOCAINE 2% (20 MG/ML) 5 ML SYRINGE
INTRAMUSCULAR | Status: DC | PRN
  Administered 2024-04-26: 80 mg via INTRAVENOUS

## 2024-04-26 MED ORDER — FENTANYL CITRATE (PF) 250 MCG/5ML IJ SOLN
INTRAMUSCULAR | Status: DC | PRN
  Administered 2024-04-26: 100 ug via INTRAVENOUS
  Administered 2024-04-26: 50 ug via INTRAVENOUS

## 2024-04-26 MED ORDER — INSULIN ASPART 100 UNIT/ML IJ SOLN: 0.0000 [IU] | INTRAMUSCULAR | Status: DC | PRN

## 2024-04-26 MED ORDER — ORAL CARE MOUTH RINSE: 15.0000 mL | Freq: Once | OROMUCOSAL | Status: AC

## 2024-04-26 MED ORDER — HYDROCODONE-ACETAMINOPHEN 5-325 MG PO TABS: 1.0000 | ORAL_TABLET | Freq: Four times a day (QID) | ORAL | 0 refills | Status: DC | PRN

## 2024-04-26 MED ORDER — SODIUM CHLORIDE 0.9% FLUSH: 3.0000 mL | INTRAVENOUS | Status: DC | PRN

## 2024-04-26 MED ORDER — CEFAZOLIN SODIUM-DEXTROSE 2-4 GM/100ML-% IV SOLN
2.0000 g | INTRAVENOUS | Status: AC
  Administered 2024-04-26: 2 g via INTRAVENOUS
  Filled 2024-04-26: qty 100

## 2024-04-26 MED ORDER — PROPOFOL 10 MG/ML IV BOLUS
INTRAVENOUS | Status: DC | PRN
  Administered 2024-04-26: 170 mg via INTRAVENOUS

## 2024-04-26 MED ORDER — SODIUM CHLORIDE 0.9 % IV SOLN
INTRAVENOUS | Status: DC | PRN
Start: 2024-04-26 — End: 2024-04-26

## 2024-04-26 MED ORDER — ONDANSETRON HCL 4 MG/2ML IJ SOLN
INTRAMUSCULAR | Status: DC | PRN
  Administered 2024-04-26: 4 mg via INTRAVENOUS

## 2024-04-26 SURGICAL SUPPLY — 27 items
ARMBAND PINK RESTRICT EXTREMIT (MISCELLANEOUS) ×1 IMPLANT
BAG COUNTER SPONGE SURGICOUNT (BAG) ×1 IMPLANT
BENZOIN TINCTURE PRP APPL 2/3 (GAUZE/BANDAGES/DRESSINGS) ×1 IMPLANT
CANISTER SUCTION 3000ML PPV (SUCTIONS) ×1 IMPLANT
CHLORAPREP W/TINT 26 (MISCELLANEOUS) ×1 IMPLANT
CLIP LIGATING EXTRA MED SLVR (CLIP) ×1 IMPLANT
CLIP LIGATING EXTRA SM BLUE (MISCELLANEOUS) ×1 IMPLANT
COVER PROBE W GEL 5X96 (DRAPES) ×1 IMPLANT
DERMABOND ADVANCED .7 DNX12 (GAUZE/BANDAGES/DRESSINGS) IMPLANT
ELECTRODE REM PT RTRN 9FT ADLT (ELECTROSURGICAL) ×1 IMPLANT
GLOVE BIO SURGEON STRL SZ8 (GLOVE) ×1 IMPLANT
GOWN STRL REUS W/ TWL LRG LVL3 (GOWN DISPOSABLE) ×2 IMPLANT
GOWN STRL REUS W/ TWL XL LVL3 (GOWN DISPOSABLE) ×1 IMPLANT
KIT BASIN OR (CUSTOM PROCEDURE TRAY) ×1 IMPLANT
KIT TURNOVER KIT B (KITS) ×1 IMPLANT
NS IRRIG 1000ML POUR BTL (IV SOLUTION) ×1 IMPLANT
PACK CV ACCESS (CUSTOM PROCEDURE TRAY) ×1 IMPLANT
PAD ARMBOARD POSITIONER FOAM (MISCELLANEOUS) ×2 IMPLANT
SLING ARM FOAM STRAP LRG (SOFTGOODS) IMPLANT
STRIP CLOSURE SKIN 1/2X4 (GAUZE/BANDAGES/DRESSINGS) ×2 IMPLANT
SUT MNCRL AB 4-0 PS2 18 (SUTURE) ×1 IMPLANT
SUT PROLENE 6 0 BV (SUTURE) ×1 IMPLANT
SUT SILK 2 0 SH (SUTURE) IMPLANT
SUT VIC AB 3-0 SH 27X BRD (SUTURE) ×1 IMPLANT
TOWEL GREEN STERILE (TOWEL DISPOSABLE) ×1 IMPLANT
UNDERPAD 30X36 HEAVY ABSORB (UNDERPADS AND DIAPERS) ×1 IMPLANT
WATER STERILE IRR 1000ML POUR (IV SOLUTION) ×1 IMPLANT

## 2024-04-26 NOTE — Interval H&P Note (Signed)
 History and Physical Interval Note:  04/26/2024 12:55 PM  [name] [name]  has presented today for surgery, with the diagnosis of END STAGE RENAL DISEASE.  The various methods of treatment have been discussed with the patient and family. After consideration of risks, benefits and other options for treatment, the patient has consented to  Procedure(s): TRANSPOSITION, VEIN, BASILIC (Left) as a surgical intervention.  The patient's history has been reviewed, patient examined, no change in status, stable for surgery.  I have reviewed the patient's chart and labs.  Questions were answered to the patient's satisfaction.     Carlene Che

## 2024-04-26 NOTE — Anesthesia Postprocedure Evaluation (Signed)
 Anesthesia Post Note  Patient: [name] [name]  Procedure(s) Performed: LEFT SECOND STAGE TRANSPOSITION, VEIN, BASILIC (Left: Arm Upper)     Patient location during evaluation: PACU Anesthesia Type: General Level of consciousness: awake and alert Pain management: pain level controlled Vital Signs Assessment: post-procedure vital signs reviewed and stable Respiratory status: spontaneous breathing, nonlabored ventilation, respiratory function stable and patient connected to nasal cannula oxygen Cardiovascular status: blood pressure returned to baseline and stable Postop Assessment: no apparent nausea or vomiting Anesthetic complications: no   No notable events documented.  Last Vitals:  Vitals:   04/26/24 1615 04/26/24 1630  BP: (!) 170/91 (!) 169/94  Pulse: 79 80  Resp: 17 19  Temp:  37.2 C  SpO2: 97% 93%    Last Pain:  Vitals:   04/26/24 1630  TempSrc:   PainSc: 5                  Kellyn Mccary P Dajanae Brophy

## 2024-04-26 NOTE — Op Note (Signed)
 DATE OF SERVICE: 04/26/2024  PATIENT:  [name] [name]  53 y.o. male  PRE-OPERATIVE DIAGNOSIS:  ESRD  POST-OPERATIVE DIAGNOSIS:  Same  PROCEDURE:   Left arm second stage basilic vein transposition  SURGEON:  Surgeons and Role:    * Carlene Che, MD - Primary  ASSISTANT: Wynonia Hedges, PA-C  An experienced assistant was required given the complexity of this procedure and the standard of surgical care. My assistant helped with exposure through counter tension, suctioning, ligation and retraction to better visualize the surgical field.  My assistant expedited sewing during the case by following my sutures. Wherever I use the term "we" in the report, my assistant actively helped me with that portion of the procedure.  ANESTHESIA:   general  EBL: 50mL  BLOOD ADMINISTERED:none  DRAINS: none   LOCAL MEDICATIONS USED:  NONE  SPECIMEN:  none  COUNTS: confirmed correct.  TOURNIQUET:  none  PATIENT DISPOSITION:  PACU - hemodynamically stable.   Delay start of Pharmacological VTE agent (>24hrs) due to surgical blood loss or risk of bleeding: no  INDICATION FOR PROCEDURE: [name] [name] is a 53 y.o. male with ESRD in need of permanent dialysis catheter. My partner had previously performed a first stage basilic vein fistula in the left arm. After careful discussion of risks, benefits, and alternatives the patient was offered transposition of the fistula. The patient understood and wished to proceed.  OPERATIVE FINDINGS: healthy fistula. Good result from transposition.  DESCRIPTION OF PROCEDURE: After identification of the patient in the pre-operative holding area, the patient was transferred to the operating room. The patient was positioned supine on the operating room table. Anesthesia was induced. The left arm was prepped and draped in standard fashion. A surgical pause was performed confirming correct patient, procedure, and operative location.  Using intraoperative  ultrasound the course of the left basilic vein was marked on the skin.  3 skip incisions were made over the course of the basilic vein fistula.  These were carried down through subcutaneous tissue until the fistula was encountered.  The fascia was skeletonized from the axilla to the anastomosis, taking care to ligate and divide sidebranches, and to protect the medial antebrachial cutaneous nerve.   A subcutaneous tunnel was created over the biceps using a sheath tunneling device.  Patient was heparinized.  The fistula near the anastomosis was clamped.  The outflow in the axilla was clamped.  The fistula was divided.  The fistula was marked to ensure no twisting or kinking while delivering the fistula through the tunnel.  The fistula was tunneled through the arm and delivered near the previous anastomosis.  The fistula was spatulated proximally and distally.  The fistula was reanastomosed end-to-end using continuous running suture of 5-0 Prolene.  A palpable thrill was felt over the subcutaneous course of the tunnel.  Doppler flow was excellent in the proximal and distal fistula.  The wounds were copiously irrigated.  Hemostasis was ensured in the surgical bed.  The wounds were closed in layers using 3-0 Vicryl and 4-0 Monocryl.  Upon completion of the case instrument and sharps counts were confirmed correct. The patient was transferred to the PACU in good condition. I was present for all portions of the procedure.  FOLLOW UP PLAN: Assuming a normal postoperative course, VVS will see the patient in 6 weeks with AVF duplex.   Heber Little. Edgardo Goodwill, MD [facility] Phone Number: [phone] 04/26/2024 5:54 PM

## 2024-04-26 NOTE — Anesthesia Procedure Notes (Signed)
 Procedure Name: LMA Insertion Date/Time: 04/26/2024 1:26 PM  Performed by: Hebert Littler, CRNAPre-anesthesia Checklist: Patient identified, Emergency Drugs available, Suction available, Patient being monitored and Timeout performed Patient Re-evaluated:Patient Re-evaluated prior to induction Oxygen Delivery Method: Circle system utilized Preoxygenation: Pre-oxygenation with 100% oxygen Induction Type: IV induction LMA: LMA inserted LMA Size: 4.0 Number of attempts: 1 Airway Equipment and Method: Patient positioned with wedge pillow Placement Confirmation: positive ETCO2, CO2 detector and breath sounds checked- equal and bilateral Tube secured with: Tape

## 2024-04-26 NOTE — Transfer of Care (Signed)
 Immediate Anesthesia Transfer of Care Note  Patient: KOHLER PELLERITO  Procedure(s) Performed: LEFT SECOND STAGE TRANSPOSITION, VEIN, BASILIC (Left: Arm Upper)  Patient Location: PACU  Anesthesia Type:General  Level of Consciousness: awake, alert , oriented, and patient cooperative  Airway & Oxygen Therapy: Patient Spontanous Breathing and Patient connected to face mask oxygen  Post-op Assessment: Report given to RN, Post -op Vital signs reviewed and stable, Patient moving all extremities, Patient moving all extremities X 4, and Patient able to stick tongue midline  Post vital signs: Reviewed and stable  Last Vitals:  Vitals Value Taken Time  BP 149/73 04/26/24 1501  Temp    Pulse 76 04/26/24 1503  Resp    SpO2 92 % 04/26/24 1503  Vitals shown include unfiled device data.  Last Pain:  Vitals:   04/26/24 0855  TempSrc:   PainSc: 0-No pain         Complications: No notable events documented.

## 2024-04-26 NOTE — Discharge Instructions (Signed)
 [facility] [place]  Discharge Instructions  AV Fistula or Graft Surgery for Dialysis Access  Please refer to the following instructions for your post-procedure care. Your surgeon or physician assistant will discuss any changes with you.  Activity  You may drive the day following your surgery, if you are comfortable and no longer taking prescription pain medication. Resume full activity as the soreness in your incision resolves.  Bathing/Showering  You may shower after you go home. Keep your incision dry for 48 hours. Do not soak in a bathtub, hot tub, or swim until the incision heals completely. You may not shower if you have a hemodialysis catheter.  Incision Care  Clean your incision with mild soap and water after 48 hours. Pat the area dry with a clean towel. You do not need a bandage unless otherwise instructed. Do not apply any ointments or creams to your incision. You may have skin glue on your incision. Do not peel it off. It will come off on its own in about one week. Your arm may swell a bit after surgery. To reduce swelling use pillows to elevate your arm so it is above your heart. Your doctor will tell you if you need to lightly wrap your arm with an ACE bandage.  Diet  Resume your normal diet. There are not special food restrictions following this procedure. In order to heal from your surgery, it is CRITICAL to get adequate nutrition. Your body requires vitamins, minerals, and protein. Vegetables are the best source of vitamins and minerals. Vegetables also provide the perfect balance of protein. Processed food has little nutritional value, so try to avoid this.  Medications  Resume taking all of your medications. If your incision is causing pain, you may take over-the counter pain relievers such as acetaminophen  (Tylenol ). If you were prescribed a stronger pain medication, please be aware these medications can cause nausea and constipation. Prevent  nausea by taking the medication with a snack or meal. Avoid constipation by drinking plenty of fluids and eating foods with high amount of fiber, such as fruits, vegetables, and grains.  Do not take Tylenol  if you are taking prescription pain medications.  Follow up Your surgeon may want to see you in the office following your access surgery. If so, this will be arranged at the time of your surgery.  Please call us  immediately for any of the following conditions:  Increased pain, redness, drainage (pus) from your incision site Fever of 101 degrees or higher Severe or worsening pain at your incision site Hand pain or numbness.  Reduce your risk of vascular disease:  Stop smoking. If you would like help, call [facility] at 1-800-QUIT-NOW (705-695-7243) or [place] at (951)259-6410  Manage your cholesterol Maintain a desired weight Control your diabetes Keep your blood pressure down  Dialysis  It will take several weeks to several months for your new dialysis access to be ready for use. Your surgeon will determine when it is okay to use it. Your nephrologist will continue to direct your dialysis. You can continue to use your Permcath until your new access is ready for use.   04/26/2024 [name] [name] [id number] 1970-12-10  Surgeon(s): Carlene Che, MD  Procedure(s): LEFT SECOND STAGE TRANSPOSITION, VEIN, BASILIC   May stick graft immediately   May stick graft on designated area only:   X Do not stick left AV fistula transposition for 6 weeks    If you have any questions, please call the  office at [phone].

## 2024-04-26 NOTE — Anesthesia Preprocedure Evaluation (Signed)
 Anesthesia Evaluation  Patient identified by MRN, date of birth, ID band Patient awake    Reviewed: Allergy & Precautions, NPO status , Patient's Chart, lab work & pertinent test results  Airway Mallampati: II  TM Distance: >3 FB Neck ROM: Full    Dental no notable dental hx.    Pulmonary Current Smoker   Pulmonary exam normal        Cardiovascular hypertension, Pt. on medications and Pt. on home beta blockers + Peripheral Vascular Disease   Rhythm:Regular Rate:Normal     Neuro/Psych    Depression    negative neurological ROS     GI/Hepatic Neg liver ROS,GERD  Medicated,,  Endo/Other  diabetes    Renal/GU ESRF and DialysisRenal disease  negative genitourinary   Musculoskeletal  (+) Arthritis , Osteoarthritis,    Abdominal Normal abdominal exam  (+)   Peds  Hematology  (+) Blood dyscrasia, anemia Lab Results      Component                Value               Date                      WBC                      6.5                 02/16/2024                HGB                      10.5 (L)            04/26/2024                HCT                      31.0 (L)            04/26/2024                MCV                      87                  02/16/2024                PLT                      118 (L)             02/16/2024              Anesthesia Other Findings   Reproductive/Obstetrics                             Anesthesia Physical Anesthesia Plan  ASA: 3  Anesthesia Plan: General   Post-op Pain Management:    Induction: Intravenous  PONV Risk Score and Plan: 1 and Ondansetron , Dexamethasone , Midazolam  and Treatment may vary due to age or medical condition  Airway Management Planned: Mask and LMA  Additional Equipment: None  Intra-op Plan:   Post-operative Plan: Extubation in OR  Informed Consent: I have reviewed the patients History and Physical, chart, labs and discussed the  procedure including the risks, benefits and alternatives  for the proposed anesthesia with the patient or authorized representative who has indicated his/her understanding and acceptance.     Dental advisory given  Plan Discussed with: CRNA  Anesthesia Plan Comments: (- GA per surgeon request)       Anesthesia Quick Evaluation

## 2024-04-27 ENCOUNTER — Encounter (HOSPITAL_COMMUNITY): Payer: Self-pay | Admitting: Vascular Surgery

## 2024-04-27 ENCOUNTER — Other Ambulatory Visit: Payer: Self-pay

## 2024-04-27 DIAGNOSIS — I132 Hypertensive heart and chronic kidney disease with heart failure and with stage 5 chronic kidney disease, or end stage renal disease: Secondary | ICD-10-CM | POA: Diagnosis not present

## 2024-04-27 DIAGNOSIS — D689 Coagulation defect, unspecified: Secondary | ICD-10-CM | POA: Diagnosis not present

## 2024-04-27 DIAGNOSIS — N2581 Secondary hyperparathyroidism of renal origin: Secondary | ICD-10-CM | POA: Diagnosis not present

## 2024-04-27 DIAGNOSIS — N186 End stage renal disease: Secondary | ICD-10-CM | POA: Diagnosis not present

## 2024-04-27 DIAGNOSIS — Z992 Dependence on renal dialysis: Secondary | ICD-10-CM | POA: Diagnosis not present

## 2024-04-27 DIAGNOSIS — T8249XA Other complication of vascular dialysis catheter, initial encounter: Secondary | ICD-10-CM | POA: Diagnosis not present

## 2024-04-27 DIAGNOSIS — D631 Anemia in chronic kidney disease: Secondary | ICD-10-CM | POA: Diagnosis not present

## 2024-04-28 ENCOUNTER — Telehealth: Payer: Self-pay

## 2024-04-28 NOTE — Telephone Encounter (Signed)
 Pt called c/o of bruising to his left arm after his AVF transposition on 04/26/2024. Pt reported mild swelling of left hand.  Pt advised that bruising is to be expected and will resolve over time. Pt advised to keep hand elevated by placing arm across his chest on his right clavicle. Pt advised that a soft squeeze ball could also be used to help with the swelling.  Pt advised to call us  if:  You see or feel any changes in your graft or fistula. You have new pain, swelling, or bleeding at your access. You have a fever or chills. The skin of your arm and hand with the access looks pale or feels numb. Your fingers turn blue or you have sores at the tips of your fingers. You have redness or red streaks on the skin around, above, or below your access. If you can't reach your provider, go to an urgent care or emergency room. Get help right away if: You have bleeding at your access that can't be easily stopped. Your access is suddenly hot, swollen, red, and very painful. Blood isn't flowing through the graft or fistula. These symptoms may be an emergency. Call 911 right away. Do not wait to see if the symptoms will go away. Do not drive yourself to the hospital.   Patient stated he's taking his pain medication as needed. Pt advised not to drive when taking the pain medication.  Pt agreed.

## 2024-04-29 ENCOUNTER — Other Ambulatory Visit: Payer: Self-pay | Admitting: Student

## 2024-04-29 DIAGNOSIS — T8249XA Other complication of vascular dialysis catheter, initial encounter: Secondary | ICD-10-CM | POA: Diagnosis not present

## 2024-04-29 DIAGNOSIS — N2581 Secondary hyperparathyroidism of renal origin: Secondary | ICD-10-CM | POA: Diagnosis not present

## 2024-04-29 DIAGNOSIS — D689 Coagulation defect, unspecified: Secondary | ICD-10-CM | POA: Diagnosis not present

## 2024-04-29 DIAGNOSIS — Z992 Dependence on renal dialysis: Secondary | ICD-10-CM | POA: Diagnosis not present

## 2024-04-29 DIAGNOSIS — D631 Anemia in chronic kidney disease: Secondary | ICD-10-CM | POA: Diagnosis not present

## 2024-04-29 DIAGNOSIS — N186 End stage renal disease: Secondary | ICD-10-CM | POA: Diagnosis not present

## 2024-04-29 DIAGNOSIS — I132 Hypertensive heart and chronic kidney disease with heart failure and with stage 5 chronic kidney disease, or end stage renal disease: Secondary | ICD-10-CM | POA: Diagnosis not present

## 2024-05-01 ENCOUNTER — Encounter

## 2024-05-01 DIAGNOSIS — S72141D Displaced intertrochanteric fracture of right femur, subsequent encounter for closed fracture with routine healing: Secondary | ICD-10-CM | POA: Diagnosis not present

## 2024-05-01 DIAGNOSIS — M25561 Pain in right knee: Secondary | ICD-10-CM | POA: Diagnosis not present

## 2024-05-02 ENCOUNTER — Encounter: Payer: Self-pay | Admitting: *Deleted

## 2024-05-02 DIAGNOSIS — I132 Hypertensive heart and chronic kidney disease with heart failure and with stage 5 chronic kidney disease, or end stage renal disease: Secondary | ICD-10-CM | POA: Diagnosis not present

## 2024-05-02 DIAGNOSIS — D631 Anemia in chronic kidney disease: Secondary | ICD-10-CM | POA: Diagnosis not present

## 2024-05-02 DIAGNOSIS — D689 Coagulation defect, unspecified: Secondary | ICD-10-CM | POA: Diagnosis not present

## 2024-05-02 DIAGNOSIS — T8249XA Other complication of vascular dialysis catheter, initial encounter: Secondary | ICD-10-CM | POA: Diagnosis not present

## 2024-05-02 DIAGNOSIS — N186 End stage renal disease: Secondary | ICD-10-CM | POA: Diagnosis not present

## 2024-05-02 DIAGNOSIS — Z992 Dependence on renal dialysis: Secondary | ICD-10-CM | POA: Diagnosis not present

## 2024-05-02 DIAGNOSIS — N2581 Secondary hyperparathyroidism of renal origin: Secondary | ICD-10-CM | POA: Diagnosis not present

## 2024-05-03 ENCOUNTER — Other Ambulatory Visit: Payer: Self-pay | Admitting: Family Medicine

## 2024-05-03 DIAGNOSIS — J439 Emphysema, unspecified: Secondary | ICD-10-CM

## 2024-05-04 DIAGNOSIS — D689 Coagulation defect, unspecified: Secondary | ICD-10-CM | POA: Diagnosis not present

## 2024-05-04 DIAGNOSIS — D631 Anemia in chronic kidney disease: Secondary | ICD-10-CM | POA: Diagnosis not present

## 2024-05-04 DIAGNOSIS — N2581 Secondary hyperparathyroidism of renal origin: Secondary | ICD-10-CM | POA: Diagnosis not present

## 2024-05-04 DIAGNOSIS — N186 End stage renal disease: Secondary | ICD-10-CM | POA: Diagnosis not present

## 2024-05-04 DIAGNOSIS — T8249XA Other complication of vascular dialysis catheter, initial encounter: Secondary | ICD-10-CM | POA: Diagnosis not present

## 2024-05-04 DIAGNOSIS — I132 Hypertensive heart and chronic kidney disease with heart failure and with stage 5 chronic kidney disease, or end stage renal disease: Secondary | ICD-10-CM | POA: Diagnosis not present

## 2024-05-04 DIAGNOSIS — Z992 Dependence on renal dialysis: Secondary | ICD-10-CM | POA: Diagnosis not present

## 2024-05-06 DIAGNOSIS — D689 Coagulation defect, unspecified: Secondary | ICD-10-CM | POA: Diagnosis not present

## 2024-05-06 DIAGNOSIS — N186 End stage renal disease: Secondary | ICD-10-CM | POA: Diagnosis not present

## 2024-05-06 DIAGNOSIS — Z992 Dependence on renal dialysis: Secondary | ICD-10-CM | POA: Diagnosis not present

## 2024-05-06 DIAGNOSIS — N2581 Secondary hyperparathyroidism of renal origin: Secondary | ICD-10-CM | POA: Diagnosis not present

## 2024-05-06 DIAGNOSIS — I132 Hypertensive heart and chronic kidney disease with heart failure and with stage 5 chronic kidney disease, or end stage renal disease: Secondary | ICD-10-CM | POA: Diagnosis not present

## 2024-05-06 DIAGNOSIS — D631 Anemia in chronic kidney disease: Secondary | ICD-10-CM | POA: Diagnosis not present

## 2024-05-06 DIAGNOSIS — T8249XA Other complication of vascular dialysis catheter, initial encounter: Secondary | ICD-10-CM | POA: Diagnosis not present

## 2024-05-07 ENCOUNTER — Emergency Department (HOSPITAL_COMMUNITY)
Admission: EM | Admit: 2024-05-07 | Discharge: 2024-05-07 | Disposition: A | Discharge status: Home / Self Care | Attending: Emergency Medicine | Admitting: Emergency Medicine

## 2024-05-07 ENCOUNTER — Ambulatory Visit: Admission: EM | Admit: 2024-05-07 | Discharge: 2024-05-07 | Disposition: A | Discharge status: Home / Self Care

## 2024-05-07 ENCOUNTER — Other Ambulatory Visit: Payer: Self-pay

## 2024-05-07 ENCOUNTER — Encounter (HOSPITAL_COMMUNITY): Payer: Self-pay

## 2024-05-07 DIAGNOSIS — Y798 Miscellaneous orthopedic devices associated with adverse incidents, not elsewhere classified: Secondary | ICD-10-CM | POA: Diagnosis not present

## 2024-05-07 DIAGNOSIS — Z992 Dependence on renal dialysis: Secondary | ICD-10-CM | POA: Diagnosis not present

## 2024-05-07 DIAGNOSIS — N186 End stage renal disease: Secondary | ICD-10-CM | POA: Insufficient documentation

## 2024-05-07 DIAGNOSIS — T81328A Disruption or dehiscence of closure of other specified internal operation (surgical) wound, initial encounter: Secondary | ICD-10-CM | POA: Insufficient documentation

## 2024-05-07 DIAGNOSIS — T8130XA Disruption of wound, unspecified, initial encounter: Secondary | ICD-10-CM

## 2024-05-07 DIAGNOSIS — T8131XA Disruption of external operation (surgical) wound, not elsewhere classified, initial encounter: Secondary | ICD-10-CM | POA: Diagnosis not present

## 2024-05-07 DIAGNOSIS — I770 Arteriovenous fistula, acquired: Secondary | ICD-10-CM

## 2024-05-07 NOTE — ED Provider Notes (Signed)
 Patient presented to urgent care with leaking from his left arm fistula. Recommended further evaluation in the ED as we are limited in UC setting to assess. Patient is agreeable and will transport via POV.    Vernestine Gondola, PA-C 05/07/24 1554

## 2024-05-07 NOTE — ED Notes (Addendum)
 Screened by Jami Mcclintock, PA-C prior to triage based on complaint of fistula leaking- pt advised to go to ED

## 2024-05-07 NOTE — ED Triage Notes (Signed)
 Pt presents with serous drainage from one small area of his dialysis fistula in his left arm x 2 weeks. Last full dialysis was yesterday. He also presents with swelling in his left hand. He states he had surgery to move that fistula 2 weeks ago.

## 2024-05-07 NOTE — Discharge Instructions (Addendum)
 Recommend elevating arm throughout the day.  Recommend continuing pain medication as previously prescribed.  Recommend avoiding Ace wrap only to elbow as this can worsen swelling in the hand. Please Call vascular surgery on Monday.  Thank you for allowing us  to take care of you today.  We hope you begin feeling better soon.   To-Do:  Please follow-up with your primary doctor within the next 2-3 days. Please return to the Emergency Department or call 911 if you experience chest pain, shortness of breath, severe pain, severe fever, altered mental status, or have any reason to think that you need emergency medical care.  Thank you again.  Hope you feel better soon.  Arlin Benes Department of Emergency Medicine

## 2024-05-07 NOTE — ED Provider Notes (Signed)
 [place] EMERGENCY DEPARTMENT AT [facility] Provider Note   CSN: [id number] Arrival date & time: 05/07/24  1622     History Chief Complaint  Patient presents with   Vascular Access Problem    [name] [name] is a 53 y.o. male w/ PMHx ESRD who presents to the ED for evaluation of fistula.  Patient states one of his incisions has been leaking clear fluid for approximately 3 to 4 days.  He states he has had swelling to his lower arm but notes he had swelling to his lower arm before the surgery as well.  Patient states he is only elevating his arm at night.  Patient does report postoperative pain.  No fevers or chills no purulent drainage.  No redness to the incision.   Physical Exam Updated Vital Signs BP (!) 160/80   Pulse 77   Temp 98.2 F (36.8 C) (Oral)   Resp 18   Ht 6' 5 (1.956 m)   Wt 108 kg   SpO2 94%   BMI 28.22 kg/m  Physical Exam Constitutional:      General: He is not in acute distress.    Appearance: He is obese. He is not ill-appearing or toxic-appearing.  HENT:     Mouth/Throat:     Mouth: Mucous membranes are moist.   Cardiovascular:     Rate and Rhythm: Normal rate.     Pulses: Normal pulses.  Pulmonary:     Effort: Pulmonary effort is normal.   Musculoskeletal:        General: Normal range of motion.     Cervical back: Normal range of motion.   Skin:    General: Skin is warm and dry.     Capillary Refill: Capillary refill takes less than 2 seconds.     Comments: Patient has Ace wrap applied to mid bicep through elbow. Patient has approximately 1 cm dehiscence of 1 of surgical his incisions.  1 drop of serous fluid leaks out of wound when he flexes arm.  No consistent drainage.  No evidence of acute infection.  Palpable thrill and normal radial pulse.  Normal capillary refill.  Patient does have swelling of hand and arm.  Per chart review patient underwent procedure due to hand and arm edema.   Neurological:     General: No focal  deficit present.     Mental Status: He is alert.     ED Course/ Medical Decision Making/ A&P  [name] [name] is a 53 y.o. male presents as detailed above  Differential ddx: Wound dehiscence, wound infection, postoperative pain, postoperative swelling,  On arrival, patient afebrile hemodynamically stable no hypoxia or respiratory distress.   Patient has Ace wrap applied to mid bicep through elbow. Patient has approximately 1 cm dehiscence of 1 of surgical his incisions.  1 drop of serous fluid leaks out of wound when he flexes arm.  No consistent drainage.  No evidence of acute infection.  Palpable thrill and normal radial pulse.  Normal capillary refill.  Patient does have swelling of hand and arm.  Per chart review patient underwent procedure due to hand and arm edema.  Per chart review patient underwent left arm second stage basilic vein transposition 6/4 by Dr. Runell Countryman. Done due to distal arm edema. Fistulogram of the basilic vein fistula in the left arm was negative for outflow stenosis. Patient still wanted to procedure  Dressing applied   In summary, small area of incision dehiscence.  No signs of acute  infection.  No signs of upper extremity DVT.  Advised patient to follow-up with vascular surgery in the next few days.  Reviewed infection precautions.  Reviewed wound care instructions.   Patient stable for discharge and outpatient follow-up. Strict return precautions provided. Patient voices understanding and agrees with plan.   Patient seen with supervising physician who agrees with plan.  Final Clinical Impression(s) / ED Diagnoses Final diagnoses:  Wound dehiscence    Angele Keller, DO PGY-3 Emergency Medicine    Angele Keller, DO 05/07/24 1801    Albertus Hughs, DO 05/07/24 1804

## 2024-05-08 ENCOUNTER — Telehealth: Payer: Self-pay

## 2024-05-08 DIAGNOSIS — M25561 Pain in right knee: Secondary | ICD-10-CM | POA: Diagnosis not present

## 2024-05-08 NOTE — Telephone Encounter (Signed)
 Pt called after being seen in the ED over the weekend with c/o incision on LUE opening up about 3 days ago and a clear drainage coming out. He was advised to f/u with vascular. He has been scheduled with MD tomorrow.

## 2024-05-09 ENCOUNTER — Ambulatory Visit: Attending: Vascular Surgery | Admitting: Vascular Surgery

## 2024-05-09 ENCOUNTER — Encounter: Payer: Self-pay | Admitting: Vascular Surgery

## 2024-05-09 VITALS — BP 198/89 | HR 89 | Temp 98.5°F | Ht 77.0 in | Wt 238.0 lb

## 2024-05-09 DIAGNOSIS — Z992 Dependence on renal dialysis: Secondary | ICD-10-CM

## 2024-05-09 DIAGNOSIS — N186 End stage renal disease: Secondary | ICD-10-CM | POA: Diagnosis not present

## 2024-05-09 DIAGNOSIS — I132 Hypertensive heart and chronic kidney disease with heart failure and with stage 5 chronic kidney disease, or end stage renal disease: Secondary | ICD-10-CM | POA: Diagnosis not present

## 2024-05-09 DIAGNOSIS — D689 Coagulation defect, unspecified: Secondary | ICD-10-CM | POA: Diagnosis not present

## 2024-05-09 DIAGNOSIS — T8249XA Other complication of vascular dialysis catheter, initial encounter: Secondary | ICD-10-CM | POA: Diagnosis not present

## 2024-05-09 DIAGNOSIS — N2581 Secondary hyperparathyroidism of renal origin: Secondary | ICD-10-CM | POA: Diagnosis not present

## 2024-05-09 DIAGNOSIS — D631 Anemia in chronic kidney disease: Secondary | ICD-10-CM | POA: Diagnosis not present

## 2024-05-09 DIAGNOSIS — Z89511 Acquired absence of right leg below knee: Secondary | ICD-10-CM | POA: Diagnosis not present

## 2024-05-09 NOTE — Progress Notes (Unsigned)
 [facility] PROGRESS NOTE  ASSESSMENT / PLAN: [name] [name] is a 53 y.o. male status post left arm second stage brachiobasilic AV fistula transposition.  He has mild superficial dehiscence of the distalmost incision with persistent serous drainage.  Is likely due to persistent edema in the left upper extremity.  I instructed the patient in how to wrap the arm with an Ace wrap and recovered the wound with Dermabond to reduce drainage.  Follow-up as scheduled with me or PA  SUBJECTIVE: Patient returns to clinic as a work in for evaluation of superficial dehiscence and serous drainage from the distalmost incision after brachiobasilic AV fistula transposition.  No constitutional symptoms of infection.  OBJECTIVE: BP (!) 198/89 (BP Location: Right Arm, Patient Position: Sitting, Cuff Size: Normal)   Pulse 89   Temp 98.5 F (36.9 C)   Ht 6' 5 (1.956 m)   Wt 238 lb (108 kg)   SpO2 94%   BMI 28.22 kg/m  No acute distress Regular rate and rhythm Delay breathing Distalmost incision in the left arm has superficial dehiscence without surrounding cellulitis, erythema, warmth, or purulent drainage.  The drainage is serous.  Wound dressed with Dermabond.  Arm wrapped with Ace wrap     Latest Ref Rng & Units 04/26/2024    9:34 AM 03/17/2024    6:35 AM 02/16/2024   12:15 PM  CBC  WBC 3.4 - 10.8 x10E3/uL   6.5   Hemoglobin 13.0 - 17.0 g/dL 28.4  13.2  9.7   Hematocrit 39.0 - 52.0 % 31.0  33.0  31.1   Platelets 150 - 450 x10E3/uL   118         Latest Ref Rng & Units 04/26/2024    9:34 AM 03/17/2024    6:35 AM 02/16/2024   12:15 PM  CMP  Glucose 70 - 99 mg/dL 440  102  725   BUN 6 - 20 mg/dL 26  30  21    Creatinine 0.61 - 1.24 mg/dL 3.66  4.40  3.47   Sodium 135 - 145 mmol/L 139  138  136   Potassium 3.5 - 5.1 mmol/L 4.5  4.7  4.8   Chloride 98 - 111 mmol/L 100  101  101   CO2 20 - 29 mmol/L   22   Calcium  8.7 - 10.2 mg/dL   7.8     Estimated Creatinine  Clearance: 21.7 mL/min (A) (by C-G formula based on SCr of 5.5 mg/dL (H)).  [name] [name]. [name] Goodwill, MD [facility] Phone Number: [phone] 05/09/2024 4:25 PM

## 2024-05-10 ENCOUNTER — Ambulatory Visit (HOSPITAL_BASED_OUTPATIENT_CLINIC_OR_DEPARTMENT_OTHER)

## 2024-05-10 DIAGNOSIS — R0602 Shortness of breath: Secondary | ICD-10-CM

## 2024-05-10 DIAGNOSIS — E118 Type 2 diabetes mellitus with unspecified complications: Secondary | ICD-10-CM | POA: Diagnosis not present

## 2024-05-10 DIAGNOSIS — Z794 Long term (current) use of insulin: Secondary | ICD-10-CM | POA: Diagnosis not present

## 2024-05-10 LAB — ECHOCARDIOGRAM COMPLETE
Area-P 1/2: 4.39 cm2
S' Lateral: 3.25 cm

## 2024-05-11 ENCOUNTER — Ambulatory Visit: Attending: Vascular Surgery | Admitting: Physician Assistant

## 2024-05-11 ENCOUNTER — Encounter: Payer: Self-pay | Admitting: Student

## 2024-05-11 ENCOUNTER — Other Ambulatory Visit (HOSPITAL_BASED_OUTPATIENT_CLINIC_OR_DEPARTMENT_OTHER)

## 2024-05-11 ENCOUNTER — Other Ambulatory Visit: Payer: Self-pay | Admitting: Student

## 2024-05-11 ENCOUNTER — Encounter: Payer: Self-pay | Admitting: Physician Assistant

## 2024-05-11 ENCOUNTER — Ambulatory Visit (INDEPENDENT_AMBULATORY_CARE_PROVIDER_SITE_OTHER): Admitting: Student

## 2024-05-11 VITALS — BP 204/90 | HR 88 | Ht 77.0 in | Wt 245.1 lb

## 2024-05-11 DIAGNOSIS — I10 Essential (primary) hypertension: Secondary | ICD-10-CM

## 2024-05-11 DIAGNOSIS — I132 Hypertensive heart and chronic kidney disease with heart failure and with stage 5 chronic kidney disease, or end stage renal disease: Secondary | ICD-10-CM | POA: Diagnosis not present

## 2024-05-11 DIAGNOSIS — Q453 Other congenital malformations of pancreas and pancreatic duct: Secondary | ICD-10-CM | POA: Diagnosis not present

## 2024-05-11 DIAGNOSIS — R0981 Nasal congestion: Secondary | ICD-10-CM

## 2024-05-11 DIAGNOSIS — D631 Anemia in chronic kidney disease: Secondary | ICD-10-CM | POA: Diagnosis not present

## 2024-05-11 DIAGNOSIS — R112 Nausea with vomiting, unspecified: Secondary | ICD-10-CM

## 2024-05-11 DIAGNOSIS — N186 End stage renal disease: Secondary | ICD-10-CM

## 2024-05-11 DIAGNOSIS — N2581 Secondary hyperparathyroidism of renal origin: Secondary | ICD-10-CM | POA: Diagnosis not present

## 2024-05-11 DIAGNOSIS — Z992 Dependence on renal dialysis: Secondary | ICD-10-CM | POA: Diagnosis not present

## 2024-05-11 DIAGNOSIS — D689 Coagulation defect, unspecified: Secondary | ICD-10-CM | POA: Diagnosis not present

## 2024-05-11 DIAGNOSIS — T8249XA Other complication of vascular dialysis catheter, initial encounter: Secondary | ICD-10-CM | POA: Diagnosis not present

## 2024-05-11 MED ORDER — HYDRALAZINE HCL 50 MG PO TABS: 50.0000 mg | ORAL_TABLET | Freq: Three times a day (TID) | ORAL | 0 refills | Status: DC

## 2024-05-11 MED ORDER — FLUTICASONE PROPIONATE 50 MCG/ACT NA SUSP
2.0000 | Freq: Every day | NASAL | 6 refills | Status: AC
Start: 2024-05-11 — End: ?

## 2024-05-11 NOTE — Patient Instructions (Addendum)
 It was great to see you! Thank you for allowing me to participate in your care!   Our plans for today:  - Please start the flonase  for your post nasal drip and congestion - Please follow-up with your pulmonologist - I am ordering a repeat MRI for follow-up of your pancreas - I am adding a dose of hydralazine  in the middle of the day so you should be taking this 3 times a day 50 mg - 2-week follow-up for blood pressure, return to care sooner if having headaches, vision changes, shortness of breath, chest pain  Take care and seek immediate care sooner if you develop any concerns.  Genora Kidd, MD

## 2024-05-11 NOTE — Progress Notes (Signed)
 POST OPERATIVE OFFICE NOTE    CC:  F/u for surgery  HPI:  This is a 53 y.o. 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 Edgardo Goodwill.    He was seen earlier this week with a superficial dehiscence of the distal incision.  Dermabond was applied as well as ace wrap.  It was felt this was due to swelling in the upper arm.    Pt returns today with reopening of his incision.  It continues to have some clear drainage from the incision.  He states he tries to elevate his arm.    Pt states he does not have pain/numbness in the left hand.       Allergies  Allergen Reactions   Iron  Nausea And Vomiting    With the iron  tablets   Tape Rash    Rash at site of tape--okay to use paper tape    Current Outpatient Medications  Medication Sig Dispense Refill   Accu-Chek Softclix Lancets lancets Use to check blood sugar twice per day. E11.9 100 each 12   acetaminophen  (TYLENOL ) 500 MG tablet Take 2 tablets (1,000 mg total) by mouth every 6 (six) hours as needed. 30 tablet 0   albuterol  (VENTOLIN  HFA) 108 (90 Base) MCG/ACT inhaler Inhale 2 puffs into the lungs every 6 (six) hours as needed for wheezing or shortness of breath. 8 g 2   atorvastatin  (LIPITOR) 40 MG tablet TAKE 1 TABLET(40 MG) BY MOUTH AT BEDTIME 90 tablet 0   Blood Glucose Monitoring Suppl (ACCU-CHEK GUIDE ME) w/Device KIT See admin instructions.     calcitRIOL  (ROCALTROL ) 0.25 MCG capsule Take 1 capsule (0.25 mcg total) by mouth Every Tuesday,Thursday,and Saturday with dialysis. 30 capsule 0   Calcium  Acetate 667 MG TABS Take 1,334 mg by mouth 3 (three) times daily.     diltiazem  (CARDIZEM  CD) 300 MG 24 hr capsule TAKE 1 CAPSULE(300 MG) BY MOUTH DAILY 90 capsule 0   FLUoxetine  (PROZAC ) 20 MG capsule TAKE 1 CAPSULE(20 MG) BY MOUTH DAILY 90 capsule 2   fluticasone  (FLONASE ) 50 MCG/ACT nasal spray Place 2 sprays into both nostrils daily. (Patient taking differently: Place 2 sprays into both  nostrils daily as needed (Congestion).) 16 g 6   Fluticasone -Umeclidin-Vilant (TRELEGY ELLIPTA ) 100-62.5-25 MCG/ACT AEPB Inhale 1 puff into the lungs daily. 28 each 0   gabapentin  (NEURONTIN ) 300 MG capsule TAKE 1 CAPSULE(300 MG) BY MOUTH DAILY 30 capsule 0   glucose blood (AGAMATRIX PRESTO TEST) test strip Check blood sugars twice daily before meals 100 each 12   glucose blood test strip 1 each by Other route as needed for other. Use as instructed     hydrALAZINE  (APRESOLINE ) 50 MG tablet Take 1 tablet (50 mg total) by mouth 2 (two) times daily. (Patient taking differently: Take 100 mg by mouth 2 (two) times daily.) 30 tablet 0   HYDROcodone -acetaminophen  (NORCO/VICODIN) 5-325 MG tablet Take 1 tablet by mouth every 6 (six) hours as needed for severe pain (pain score 7-10). 20 tablet 0   insulin  lispro (HUMALOG ) 100 UNIT/ML injection 0-6 Units, Subcutaneous, 3 times daily with meals, First dose on Sun 10/17/23 at 0800 Correction coverage: Very Sensitive (ESRD/Dialysis) CBG < 70: Implement Hypoglycemia Standing Orders and refer to Hypoglycemia Standing Orders sidebar report CBG 70 - 120: 0 units CBG 121 - 150: 0 units CBG 151 - 200: 1 unit CBG 201-250: 2 units CBG 251-300: 3 units CBG 301-350: 4  units CBG 351-400: 5 units CBG > 400: Give 6 units and call MD (Patient taking differently: Inject into the skin 2 (two) times daily. 0-6 Units, Subcutaneous, 3 times daily with meals, as needed  Correction coverage: Very Sensitive (ESRD/Dialysis) CBG < 70: Implement Hypoglycemia Standing Orders and refer to Hypoglycemia Standing Orders sidebar report CBG 70 - 120: 0 units CBG 121 - 150: 0 units CBG 151 - 200: 1 unit CBG 201-250: 2 units CBG 251-300: 3 units CBG 301-350: 4 units CBG 351-400: 5 units CBG > 400: Give 6 units and call MD)     lidocaine  (HM LIDOCAINE  PATCH) 4 % Place 1 patch onto the skin daily. 30 patch 0   losartan  (COZAAR ) 25 MG tablet Take 1 tablet (25 mg total) by mouth daily. 90  tablet 0   metoprolol  succinate (TOPROL -XL) 25 MG 24 hr tablet Take 25 mg by mouth daily.     ondansetron  (ZOFRAN -ODT) 4 MG disintegrating tablet Take 1 tablet (4 mg total) by mouth every 8 (eight) hours as needed for nausea or vomiting. 20 tablet 0   pantoprazole  (PROTONIX ) 40 MG tablet Take 1 tablet (40 mg total) by mouth daily. 90 tablet 0   polyethylene glycol (MIRALAX  / GLYCOLAX ) 17 g packet Take 17 g by mouth daily. 30 each 2   sevelamer  carbonate (RENVELA ) 800 MG tablet Take 2 tablets (1,600 mg total) by mouth 3 (three) times daily with meals. 180 tablet 0   tamsulosin  (FLOMAX ) 0.4 MG CAPS capsule TAKE 1 CAPSULE(0.4 MG) BY MOUTH DAILY AFTER SUPPER 90 capsule 0   tiZANidine  (ZANAFLEX ) 2 MG tablet TAKE 1 TABLET(2 MG) BY MOUTH TWICE DAILY AS NEEDED FOR MUSCLE SPASMS (Patient taking differently: Take 2 mg by mouth 2 (two) times daily.) 60 tablet 0   No current facility-administered medications for this visit.     ROS:  See HPI  Physical Exam:   Incision:  distal incision with dermabond but continues to have mild serous clear drainage and all other incisions are healing nicely.   Extremities:   There is a palpable left ulnar pulse.   Motor and sensory are in tact.   There  a thrill/bruit present.  Access is  easily palpable     Assessment/Plan:  This is a 53 y.o. 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 Edgardo Goodwill.  Returns today for continued drainage from distal incision.   -the pt does not have evidence of steal. -all incisions are healing.  He had dermabond applied to distal incision earlier in the week.  He continues to have clear drainage from the incision. There is no evidence of infection.  I did apply additional dermabond, but I do not feel this is going to stop the drainage.  I waited for dermabond to dry and applied dressing with mild compression ace wrap.  I stressed importance of elevating his arm to help with the  swelling to help with the drainage.   -the pt will follow up with me on Monday for re-evaluation.     Maryanna Smart, [facility] [phone]  Clinic MD:  Rosalva Comber

## 2024-05-11 NOTE — Progress Notes (Signed)
    SUBJECTIVE:   CHIEF COMPLAINT / HPI: Congestion  Discussed the use of AI scribe software for clinical note transcription with the patient, who gave verbal consent to proceed.  History of Present Illness [name] [name] is a 53 year old male who presents with persistent throat congestion and cough.  He has experienced throat congestion and cough for almost two months, with congestion more pronounced in the mornings, requiring throat clearing and expectoration of mucus. He uses Trelegy for symptom management and has previously used nasal sprays. Pulmonary function test shows decreased lung volume and function. He continues to smoke about half a pack of cigarettes per day and experiences nasal congestion.  He is on multiple antihypertensive medications, including diltiazem  300 mg at night, hydralazine  50 BID, losartan  25 mg, and metoprolol , with home blood pressure readings ranging from 180 to 200 mmHg. He experiences swelling in his leg and hand, attributed to previous vascular surgery. He is undergoing hemodialysis with chest access and has not missed any sessions (last session today, not shortened). No headaches, chest pain, or new swelling in his legs.   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 - Prescribe flonase  -Discussed smoking cessation - F/u with pulmonology in regards  to chronic cough Severe hypertension Patient's blood pressure is not controlled today. BP: (!) 204/90.  Asymptomatic.  Goal of 140/90. Patient's medication regimen includes diltiazem  300 mg at night, hydralazine  50 BID, losartan  25 mg, and metoprolol  25 daily. 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

## 2024-05-11 NOTE — Assessment & Plan Note (Signed)
 Patient's blood pressure is not controlled today. BP: (!) 204/90.  Asymptomatic.  Goal of 140/90. Patient's medication regimen includes diltiazem  300 mg at night, hydralazine  50 BID, losartan  25 mg, and metoprolol  25 daily. -Changes to current regimen include increase hydralazine  to 50 mg TID -Follow up in 1-2 weeks

## 2024-05-11 NOTE — H&P (View-Only) (Signed)
 POST OPERATIVE OFFICE NOTE    CC:  F/u for surgery  HPI:  This is a 53 y.o. 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 Edgardo Goodwill.    He was seen earlier this week with a superficial dehiscence of the distal incision.  Dermabond was applied as well as ace wrap.  It was felt this was due to swelling in the upper arm.    Pt returns today with reopening of his incision.  It continues to have some clear drainage from the incision.  He states he tries to elevate his arm.    Pt states he does not have pain/numbness in the left hand.       Allergies  Allergen Reactions   Iron  Nausea And Vomiting    With the iron  tablets   Tape Rash    Rash at site of tape--okay to use paper tape    Current Outpatient Medications  Medication Sig Dispense Refill   Accu-Chek Softclix Lancets lancets Use to check blood sugar twice per day. E11.9 100 each 12   acetaminophen  (TYLENOL ) 500 MG tablet Take 2 tablets (1,000 mg total) by mouth every 6 (six) hours as needed. 30 tablet 0   albuterol  (VENTOLIN  HFA) 108 (90 Base) MCG/ACT inhaler Inhale 2 puffs into the lungs every 6 (six) hours as needed for wheezing or shortness of breath. 8 g 2   atorvastatin  (LIPITOR) 40 MG tablet TAKE 1 TABLET(40 MG) BY MOUTH AT BEDTIME 90 tablet 0   Blood Glucose Monitoring Suppl (ACCU-CHEK GUIDE ME) w/Device KIT See admin instructions.     calcitRIOL  (ROCALTROL ) 0.25 MCG capsule Take 1 capsule (0.25 mcg total) by mouth Every Tuesday,Thursday,and Saturday with dialysis. 30 capsule 0   Calcium  Acetate 667 MG TABS Take 1,334 mg by mouth 3 (three) times daily.     diltiazem  (CARDIZEM  CD) 300 MG 24 hr capsule TAKE 1 CAPSULE(300 MG) BY MOUTH DAILY 90 capsule 0   FLUoxetine  (PROZAC ) 20 MG capsule TAKE 1 CAPSULE(20 MG) BY MOUTH DAILY 90 capsule 2   fluticasone  (FLONASE ) 50 MCG/ACT nasal spray Place 2 sprays into both nostrils daily. (Patient taking differently: Place 2 sprays into both  nostrils daily as needed (Congestion).) 16 g 6   Fluticasone -Umeclidin-Vilant (TRELEGY ELLIPTA ) 100-62.5-25 MCG/ACT AEPB Inhale 1 puff into the lungs daily. 28 each 0   gabapentin  (NEURONTIN ) 300 MG capsule TAKE 1 CAPSULE(300 MG) BY MOUTH DAILY 30 capsule 0   glucose blood (AGAMATRIX PRESTO TEST) test strip Check blood sugars twice daily before meals 100 each 12   glucose blood test strip 1 each by Other route as needed for other. Use as instructed     hydrALAZINE  (APRESOLINE ) 50 MG tablet Take 1 tablet (50 mg total) by mouth 2 (two) times daily. (Patient taking differently: Take 100 mg by mouth 2 (two) times daily.) 30 tablet 0   HYDROcodone -acetaminophen  (NORCO/VICODIN) 5-325 MG tablet Take 1 tablet by mouth every 6 (six) hours as needed for severe pain (pain score 7-10). 20 tablet 0   insulin  lispro (HUMALOG ) 100 UNIT/ML injection 0-6 Units, Subcutaneous, 3 times daily with meals, First dose on Sun 10/17/23 at 0800 Correction coverage: Very Sensitive (ESRD/Dialysis) CBG < 70: Implement Hypoglycemia Standing Orders and refer to Hypoglycemia Standing Orders sidebar report CBG 70 - 120: 0 units CBG 121 - 150: 0 units CBG 151 - 200: 1 unit CBG 201-250: 2 units CBG 251-300: 3 units CBG 301-350: 4  units CBG 351-400: 5 units CBG > 400: Give 6 units and call MD (Patient taking differently: Inject into the skin 2 (two) times daily. 0-6 Units, Subcutaneous, 3 times daily with meals, as needed  Correction coverage: Very Sensitive (ESRD/Dialysis) CBG < 70: Implement Hypoglycemia Standing Orders and refer to Hypoglycemia Standing Orders sidebar report CBG 70 - 120: 0 units CBG 121 - 150: 0 units CBG 151 - 200: 1 unit CBG 201-250: 2 units CBG 251-300: 3 units CBG 301-350: 4 units CBG 351-400: 5 units CBG > 400: Give 6 units and call MD)     lidocaine  (HM LIDOCAINE  PATCH) 4 % Place 1 patch onto the skin daily. 30 patch 0   losartan  (COZAAR ) 25 MG tablet Take 1 tablet (25 mg total) by mouth daily. 90  tablet 0   metoprolol  succinate (TOPROL -XL) 25 MG 24 hr tablet Take 25 mg by mouth daily.     ondansetron  (ZOFRAN -ODT) 4 MG disintegrating tablet Take 1 tablet (4 mg total) by mouth every 8 (eight) hours as needed for nausea or vomiting. 20 tablet 0   pantoprazole  (PROTONIX ) 40 MG tablet Take 1 tablet (40 mg total) by mouth daily. 90 tablet 0   polyethylene glycol (MIRALAX  / GLYCOLAX ) 17 g packet Take 17 g by mouth daily. 30 each 2   sevelamer  carbonate (RENVELA ) 800 MG tablet Take 2 tablets (1,600 mg total) by mouth 3 (three) times daily with meals. 180 tablet 0   tamsulosin  (FLOMAX ) 0.4 MG CAPS capsule TAKE 1 CAPSULE(0.4 MG) BY MOUTH DAILY AFTER SUPPER 90 capsule 0   tiZANidine  (ZANAFLEX ) 2 MG tablet TAKE 1 TABLET(2 MG) BY MOUTH TWICE DAILY AS NEEDED FOR MUSCLE SPASMS (Patient taking differently: Take 2 mg by mouth 2 (two) times daily.) 60 tablet 0   No current facility-administered medications for this visit.     ROS:  See HPI  Physical Exam:   Incision:  distal incision with dermabond but continues to have mild serous clear drainage and all other incisions are healing nicely.   Extremities:   There is a palpable left ulnar pulse.   Motor and sensory are in tact.   There  a thrill/bruit present.  Access is  easily palpable     Assessment/Plan:  This is a 53 y.o. 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 Edgardo Goodwill.  Returns today for continued drainage from distal incision.   -the pt does not have evidence of steal. -all incisions are healing.  He had dermabond applied to distal incision earlier in the week.  He continues to have clear drainage from the incision. There is no evidence of infection.  I did apply additional dermabond, but I do not feel this is going to stop the drainage.  I waited for dermabond to dry and applied dressing with mild compression ace wrap.  I stressed importance of elevating his arm to help with the  swelling to help with the drainage.   -the pt will follow up with me on Monday for re-evaluation.     Maryanna Smart, [facility] [phone]  Clinic MD:  Rosalva Comber

## 2024-05-12 DIAGNOSIS — M25561 Pain in right knee: Secondary | ICD-10-CM | POA: Diagnosis not present

## 2024-05-13 ENCOUNTER — Other Ambulatory Visit: Payer: Self-pay | Admitting: Student

## 2024-05-13 DIAGNOSIS — D631 Anemia in chronic kidney disease: Secondary | ICD-10-CM | POA: Diagnosis not present

## 2024-05-13 DIAGNOSIS — D689 Coagulation defect, unspecified: Secondary | ICD-10-CM | POA: Diagnosis not present

## 2024-05-13 DIAGNOSIS — R052 Subacute cough: Secondary | ICD-10-CM

## 2024-05-13 DIAGNOSIS — N2581 Secondary hyperparathyroidism of renal origin: Secondary | ICD-10-CM | POA: Diagnosis not present

## 2024-05-13 DIAGNOSIS — T8249XA Other complication of vascular dialysis catheter, initial encounter: Secondary | ICD-10-CM | POA: Diagnosis not present

## 2024-05-13 DIAGNOSIS — I132 Hypertensive heart and chronic kidney disease with heart failure and with stage 5 chronic kidney disease, or end stage renal disease: Secondary | ICD-10-CM | POA: Diagnosis not present

## 2024-05-13 DIAGNOSIS — N186 End stage renal disease: Secondary | ICD-10-CM | POA: Diagnosis not present

## 2024-05-13 DIAGNOSIS — Z992 Dependence on renal dialysis: Secondary | ICD-10-CM | POA: Diagnosis not present

## 2024-05-15 ENCOUNTER — Other Ambulatory Visit: Payer: Self-pay

## 2024-05-15 ENCOUNTER — Other Ambulatory Visit: Payer: Self-pay | Admitting: Student

## 2024-05-15 ENCOUNTER — Ambulatory Visit: Attending: Surgery | Admitting: Physician Assistant

## 2024-05-15 VITALS — BP 204/102 | HR 91 | Temp 98.6°F | Ht 77.0 in | Wt 245.0 lb

## 2024-05-15 DIAGNOSIS — N186 End stage renal disease: Secondary | ICD-10-CM

## 2024-05-15 DIAGNOSIS — Z992 Dependence on renal dialysis: Secondary | ICD-10-CM

## 2024-05-15 NOTE — Progress Notes (Signed)
 POST OPERATIVE OFFICE NOTE    CC:  F/u for surgery  HPI:  This is a 53 y.o. male who is s/p  left 1st stage BVT on 01/26/2024 and left 2nd stage BVT on  04/26/2024 both by Dr. Magda.    Pt was seen on 05/11/2024 for continued drainage from his distal incision.  He was seen by Dr. Magda earlier in the week and dermabond was applied with ace wrap and he was instructed on arm elevation.    He was seen on Friday and had continued drainage.  Dermabond was again applied and ace wrap placed and instructed to elevate.    He returns today for follow up.  He states that the incision is still draining clear fluid.  No evidence of infection.    The pt is on dialysis T/T/S at [facility] location.   Allergies  Allergen Reactions   Iron  Nausea And Vomiting    With the iron  tablets   Tape Rash    Rash at site of tape--okay to use paper tape    Current Outpatient Medications  Medication Sig Dispense Refill   Accu-Chek Softclix Lancets lancets Use to check blood sugar twice per day. E11.9 100 each 12   acetaminophen  (TYLENOL ) 500 MG tablet Take 2 tablets (1,000 mg total) by mouth every 6 (six) hours as needed. 30 tablet 0   albuterol  (VENTOLIN  HFA) 108 (90 Base) MCG/ACT inhaler Inhale 2 puffs into the lungs every 6 (six) hours as needed for wheezing or shortness of breath. 8 g 2   atorvastatin  (LIPITOR) 40 MG tablet TAKE 1 TABLET(40 MG) BY MOUTH AT BEDTIME 90 tablet 0   Blood Glucose Monitoring Suppl (ACCU-CHEK GUIDE ME) w/Device KIT See admin instructions.     calcitRIOL  (ROCALTROL ) 0.25 MCG capsule Take 1 capsule (0.25 mcg total) by mouth Every Tuesday,Thursday,and Saturday with dialysis. 30 capsule 0   Calcium  Acetate 667 MG TABS Take 1,334 mg by mouth 3 (three) times daily.     diltiazem  (CARDIZEM  CD) 300 MG 24 hr capsule TAKE 1 CAPSULE(300 MG) BY MOUTH DAILY 90 capsule 0   FLUoxetine  (PROZAC ) 20 MG capsule TAKE 1 CAPSULE(20 MG) BY MOUTH DAILY 90 capsule 2   fluticasone  (FLONASE ) 50 MCG/ACT  nasal spray Place 2 sprays into both nostrils daily. 16 g 6   Fluticasone -Umeclidin-Vilant (TRELEGY ELLIPTA ) 100-62.5-25 MCG/ACT AEPB Inhale 1 puff into the lungs daily. 28 each 0   gabapentin  (NEURONTIN ) 300 MG capsule TAKE 1 CAPSULE(300 MG) BY MOUTH DAILY 30 capsule 0   glucose blood (AGAMATRIX PRESTO TEST) test strip Check blood sugars twice daily before meals 100 each 12   glucose blood test strip 1 each by Other route as needed for other. Use as instructed     hydrALAZINE  (APRESOLINE ) 50 MG tablet Take 1 tablet (50 mg total) by mouth 3 (three) times daily. 90 tablet 0   HYDROcodone -acetaminophen  (NORCO/VICODIN) 5-325 MG tablet Take 1 tablet by mouth every 6 (six) hours as needed for severe pain (pain score 7-10). 20 tablet 0   insulin  lispro (HUMALOG ) 100 UNIT/ML injection 0-6 Units, Subcutaneous, 3 times daily with meals, First dose on Sun 10/17/23 at 0800 Correction coverage: Very Sensitive (ESRD/Dialysis) CBG < 70: Implement Hypoglycemia Standing Orders and refer to Hypoglycemia Standing Orders sidebar report CBG 70 - 120: 0 units CBG 121 - 150: 0 units CBG 151 - 200: 1 unit CBG 201-250: 2 units CBG 251-300: 3 units CBG 301-350: 4 units CBG 351-400: 5 units CBG > 400: Give  6 units and call MD (Patient taking differently: Inject into the skin 2 (two) times daily. 0-6 Units, Subcutaneous, 3 times daily with meals, as needed  Correction coverage: Very Sensitive (ESRD/Dialysis) CBG < 70: Implement Hypoglycemia Standing Orders and refer to Hypoglycemia Standing Orders sidebar report CBG 70 - 120: 0 units CBG 121 - 150: 0 units CBG 151 - 200: 1 unit CBG 201-250: 2 units CBG 251-300: 3 units CBG 301-350: 4 units CBG 351-400: 5 units CBG > 400: Give 6 units and call MD)     lidocaine  (HM LIDOCAINE  PATCH) 4 % Place 1 patch onto the skin daily. 30 patch 0   losartan  (COZAAR ) 25 MG tablet Take 1 tablet (25 mg total) by mouth daily. 90 tablet 0   metoprolol  succinate (TOPROL -XL) 25 MG 24 hr  tablet Take 25 mg by mouth daily.     ondansetron  (ZOFRAN -ODT) 4 MG disintegrating tablet Take 1 tablet (4 mg total) by mouth every 8 (eight) hours as needed for nausea or vomiting. 20 tablet 0   pantoprazole  (PROTONIX ) 40 MG tablet TAKE 1 TABLET(40 MG) BY MOUTH DAILY 90 tablet 0   polyethylene glycol (MIRALAX  / GLYCOLAX ) 17 g packet Take 17 g by mouth daily. 30 each 2   sevelamer  carbonate (RENVELA ) 800 MG tablet Take 2 tablets (1,600 mg total) by mouth 3 (three) times daily with meals. 180 tablet 0   tamsulosin  (FLOMAX ) 0.4 MG CAPS capsule TAKE 1 CAPSULE(0.4 MG) BY MOUTH DAILY AFTER SUPPER 90 capsule 0   tiZANidine  (ZANAFLEX ) 2 MG tablet TAKE 1 TABLET(2 MG) BY MOUTH TWICE DAILY AS NEEDED FOR MUSCLE SPASMS (Patient taking differently: Take 2 mg by mouth 2 (two) times daily.) 60 tablet 0   No current facility-administered medications for this visit.     ROS:  See HPI  Physical Exam:  Today's Vitals   05/15/24 1348  BP: (!) 204/102  Pulse: 91  Temp: 98.6 F (37 C)  TempSrc: Temporal  SpO2: 90%  Weight: 245 lb (111.1 kg)  Height: 6' 5 (1.956 m)  PainSc: 7   PainLoc: Arm   Body mass index is 29.05 kg/m.   Incision:  serous drainage from mid antecubital incision; other incisions have healed nicely.  Extremities:   There is a palpable left radial pulse.   Motor and sensory are in tact.   There is an excellent thrill present throughout the fistula Access is  easily palpable   Assessment/Plan:  This is a 53 y.o. 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 Magda.    -the pt does not have evidence of steal. -he continues to have serous drainage from the mid portion of the antecubital incision. It does not appear infected and he denies any fevers.  Will plan for I&D of incision on Wednesday with Dr. Serene.  Pt is in agreement with plan.  I did give him gauze and an extra 4 ace wrap for the increased drainage.    Lucie Apt, [facility] [phone]  Clinic MD:  Serene

## 2024-05-16 ENCOUNTER — Other Ambulatory Visit: Payer: Self-pay | Admitting: Student

## 2024-05-16 ENCOUNTER — Ambulatory Visit: Payer: Self-pay | Admitting: Student in an Organized Health Care Education/Training Program

## 2024-05-16 ENCOUNTER — Encounter (HOSPITAL_COMMUNITY): Payer: Self-pay | Admitting: Surgery

## 2024-05-16 DIAGNOSIS — I10 Essential (primary) hypertension: Secondary | ICD-10-CM

## 2024-05-16 DIAGNOSIS — D689 Coagulation defect, unspecified: Secondary | ICD-10-CM | POA: Diagnosis not present

## 2024-05-16 DIAGNOSIS — N2581 Secondary hyperparathyroidism of renal origin: Secondary | ICD-10-CM | POA: Diagnosis not present

## 2024-05-16 DIAGNOSIS — T8249XA Other complication of vascular dialysis catheter, initial encounter: Secondary | ICD-10-CM | POA: Diagnosis not present

## 2024-05-16 DIAGNOSIS — Z992 Dependence on renal dialysis: Secondary | ICD-10-CM | POA: Diagnosis not present

## 2024-05-16 DIAGNOSIS — D631 Anemia in chronic kidney disease: Secondary | ICD-10-CM | POA: Diagnosis not present

## 2024-05-16 DIAGNOSIS — I132 Hypertensive heart and chronic kidney disease with heart failure and with stage 5 chronic kidney disease, or end stage renal disease: Secondary | ICD-10-CM | POA: Diagnosis not present

## 2024-05-16 DIAGNOSIS — N186 End stage renal disease: Secondary | ICD-10-CM | POA: Diagnosis not present

## 2024-05-16 NOTE — Progress Notes (Signed)
 Unable to reach patient via phone.  LMOM with detailed instructions for DOS.   PCP - Dr Wendel Lesch Cardiologist - Dr Lonni Cash (Last OV 07/19/23) EP - Dr Inocencio  Endocrinology - Warren Batty, FNP GI - Dr Victory Brand  CT Chest x-ray - 04/13/24 EKG - 01/24/24 Stress Test - n/a ECHO - 05/10/24 Cardiac Cath - n/a PFT - 04/19/24  ICD Pacemaker/Loop - n/a  Sleep Study -  n/a CPAP - none  Diabetes Type 2 Do not take Humalog  Insulin  on the morning of surgery unless your CBG is greater than 220 mg/dL.  If your CBG is greater than 220 mg/dL, you may take  of your sliding scale (correction) dose of insulin .  If your blood sugar is less than 70 mg/dL, you will need to treat for low blood sugar: Treat a low blood sugar (less than 70 mg/dL) with  cup of clear juice (cranberry or apple), 4 glucose tablets, OR glucose gel. Recheck blood sugar in 15 minutes after treatment (to make sure it is greater than 70 mg/dL). If your blood sugar is not greater than 70 mg/dL on recheck, call [phone] for further instructions.  Aspirin  & Blood Thinner Instructions:  n/a  NPO  Anesthesia review: Yes  STOP now taking any Aspirin  (unless otherwise instructed by your surgeon), Aleve, Naproxen, Ibuprofen, Motrin, Advil, Goody's, BC's, all herbal medications, fish oil, and all vitamins.

## 2024-05-16 NOTE — Progress Notes (Signed)
 Case: [id number] Date/Time: 05/17/24 1235   Procedure: IRRIGATION AND DEBRIDEMENT WOUND (Left) - Arm   Anesthesia type: Choice   Diagnosis: ESRD (end stage renal disease) (HCC) [N18.6]   Pre-op  diagnosis: ESRD   [facility] / [facility]   Surgeons: Serene Gaile ORN, MD       DISCUSSION: [name] is a 53 yo male who is a SDW prior to surgery above. PMH of current smoking, HTN, PAD s/p R BKA, SVT, GERD, IDDM (A1c 6.7 on 4/14), neuropathy, ESRD on dialysis TTS, depression.  Patient with ESRD currently on dialysis via right IJ TDC. Started in late 2024. Had Left brachiocephalic fistula creation in 09/2023 but this clotted. Had left basilic vein transposition in 01/2024 and 2nd stage on 04/26/24. Seen by Vascular on 05/15/24 and it was noted that he was having drainage from his incision. Now scheduled for surgery above.  Patient is followed by Cardiology for palpitations and SVT. Last seen in clinic on 07/19/2023. He reported ongoing palpitations that were overall controlled with Toprol . Advised f/u in 3 months. Had had multiple medical events/admissions in interim (fall and hip fx, progression to ESRD)  Seen by Pulmonology on 04/12/24 for a cough that had been lasting about 4 weeks. CT Chest showed small bilateral pleural effusions. Echo and PFTs ordered. Echo showed normal LVEF. PFTs showed mild mixed obstructive and restrictive process.  VS:  Wt Readings from Last 3 Encounters:  05/15/24 111.1 kg  05/11/24 111.2 kg  05/09/24 108 kg   Temp Readings from Last 3 Encounters:  05/15/24 37 C (Temporal)  05/09/24 36.9 C  05/07/24 36.8 C (Oral)   BP Readings from Last 3 Encounters:  05/15/24 (!) 204/102  05/11/24 (!) 204/90  05/09/24 (!) 198/89   Pulse Readings from Last 3 Encounters:  05/15/24 91  05/11/24 88  05/09/24 89     PROVIDERS: Christia Budds, MD   LABS: Obtain DOS   IMAGES: CT Chest 04/13/24:  IMPRESSION: 1. Small left and trace right pleural  effusions, improved.    EKG 01/24/24:  Normal sinus rhythm, rate 66 Prolonged QT  CV:  Echo 05/10/24:  IMPRESSIONS    1. Left ventricular ejection fraction, by estimation, is 55 to 60%. The left ventricle has normal function. The left ventricle has no regional wall motion abnormalities. There is mild asymmetric left ventricular hypertrophy. Left ventricular diastolic parameters were normal.  2. Right ventricular systolic function is normal. The right ventricular size is normal.  3. Left atrial size was mildly dilated.  4. Right atrial size was mildly dilated.  5. Mild mitral valve regurgitation.  6. The aortic valve is tricuspid. Aortic valve regurgitation is not visualized. Aortic valve sclerosis/calcification is present, without any evidence of aortic stenosis.  7. The inferior vena cava is normal in size with greater than 50% respiratory variability, suggesting right atrial pressure of 3 mmHg.  Comparison(s): The left ventricular function is unchanged.  Past Medical History:  Diagnosis Date   Abdominal pain 11/30/2023   Abdominal tenderness 10/14/2023   Allergy    seasonal and environmental   Anemia    Chest pain 12/01/2023   Chronic constipation 05/13/2020   Depression    Diabetes mellitus without complication (HCC) 2019   Dyspnea    hx - SOB with exertion   ESRD on hemodialysis (HCC)    Tues, Thurs, Sat   GERD (gastroesophageal reflux disease)    Hyperlipidemia    Hypertension    Necrotizing fasciitis (HCC) 10/13/2017  Neuromuscular disorder (HCC)    neuropathy   PAD (peripheral artery disease) (HCC)    Secondary hyperparathyroidism (HCC)    SVT (supraventricular tachycardia) (HCC)    hx   Wound dehiscence 11/24/2017    Past Surgical History:  Procedure Laterality Date   A/V FISTULAGRAM N/A 03/17/2024   Procedure: A/V Fistulagram;  Surgeon: Magda Debby SAILOR, MD;  [facility]: [facility];  Service: Cardiovascular;  Laterality: N/A;   AMPUTATION  Right 10/13/2017   Procedure: RIGHT BELOW KNEE AMPUTATION;  Surgeon: Harden Jerona GAILS, MD;  Location: [facility] OR;  Service: Orthopedics;  Laterality: Right;   AMPUTATION Right 11/24/2017   Procedure: AMPUTATION BELOW KNEE REVISION;  Surgeon: Harden Jerona GAILS, MD;  Location: [facility] OR;  Service: Orthopedics;  Laterality: Right;   AMPUTATION TOE Left    AV FISTULA PLACEMENT Left 10/19/2023   Procedure: LEFT BRACHIOCEPHALIC ARTERIOVENOUS (AV) FISTULA CREATION;  Surgeon: Pearline Norman RAMAN, MD;  Location: [facility] OR;  Service: Vascular;  Laterality: Left;   AV FISTULA PLACEMENT Left 01/26/2024   Procedure: LEFT ARTERIOVENOUS (AV) FISTULA CREATION;  Surgeon: Pearline Norman RAMAN, MD;  Location: [facility] OR;  Service: Vascular;  Laterality: Left;   BASCILIC VEIN TRANSPOSITION Left 04/26/2024   Procedure: LEFT SECOND STAGE TRANSPOSITION, VEIN, BASILIC;  Surgeon: Magda Debby SAILOR, MD;  Location: [facility] OR;  Service: Vascular;  Laterality: Left;   BIOPSY  09/02/2021   Procedure: BIOPSY;  Surgeon: Federico Rosario BROCKS, MD;  Location: [facility] ENDOSCOPY;  Service: Gastroenterology;;   BIOPSY  02/18/2023   Procedure: BIOPSY;  Surgeon: Leigh Elspeth SQUIBB, MD;  Location: [facility] ENDOSCOPY;  Service: Gastroenterology;;   COLONOSCOPY  05/11/2019   COLONOSCOPY  2021   3 polyps - tubular adenomas   ESOPHAGOGASTRODUODENOSCOPY (EGD) WITH PROPOFOL  N/A 09/02/2021   Procedure: ESOPHAGOGASTRODUODENOSCOPY (EGD) WITH PROPOFOL ;  Surgeon: Federico Rosario BROCKS, MD;  Location: [facility] ENDOSCOPY;  Service: Gastroenterology;  Laterality: N/A;   ESOPHAGOGASTRODUODENOSCOPY (EGD) WITH PROPOFOL  N/A 02/18/2023   Procedure: ESOPHAGOGASTRODUODENOSCOPY (EGD) WITH PROPOFOL ;  Surgeon: Leigh Elspeth SQUIBB, MD;  Location: [facility] ENDOSCOPY;  Service: Gastroenterology;  Laterality: N/A;   INTRAMEDULLARY (IM) NAIL INTERTROCHANTERIC Right 10/15/2023   Procedure: INTRAMEDULLARY (IM) NAIL INTERTROCHANTERIC;  Surgeon: Kendal Franky SQUIBB, MD;  Location: [facility] OR;  Service: Orthopedics;  Laterality: Right;   IR  FLUORO GUIDE CV LINE RIGHT  10/15/2023   IR US  GUIDE VASC ACCESS RIGHT  10/15/2023   POLYPECTOMY     WISDOM TOOTH EXTRACTION      MEDICATIONS: No current facility-administered medications for this encounter.    Accu-Chek Softclix Lancets lancets   acetaminophen  (TYLENOL ) 500 MG tablet   albuterol  (VENTOLIN  HFA) 108 (90 Base) MCG/ACT inhaler   atorvastatin  (LIPITOR) 40 MG tablet   Blood Glucose Monitoring Suppl (ACCU-CHEK GUIDE ME) w/Device KIT   calcitRIOL  (ROCALTROL ) 0.25 MCG capsule   Calcium  Acetate 667 MG TABS   diltiazem  (CARDIZEM  CD) 300 MG 24 hr capsule   FLUoxetine  (PROZAC ) 20 MG capsule   fluticasone  (FLONASE ) 50 MCG/ACT nasal spray   Fluticasone -Umeclidin-Vilant (TRELEGY ELLIPTA ) 100-62.5-25 MCG/ACT AEPB   gabapentin  (NEURONTIN ) 300 MG capsule   glucose blood (AGAMATRIX PRESTO TEST) test strip   glucose blood test strip   hydrALAZINE  (APRESOLINE ) 50 MG tablet   HYDROcodone -acetaminophen  (NORCO/VICODIN) 5-325 MG tablet   insulin  lispro (HUMALOG ) 100 UNIT/ML injection   lidocaine  (HM LIDOCAINE  PATCH) 4 %   losartan  (COZAAR ) 25 MG tablet   metoprolol  succinate (TOPROL -XL) 25 MG 24 hr tablet   ondansetron  (ZOFRAN -ODT) 4 MG disintegrating tablet   pantoprazole  (  PROTONIX ) 40 MG tablet   polyethylene glycol (MIRALAX  / GLYCOLAX ) 17 g packet   sevelamer  carbonate (RENVELA ) 800 MG tablet   tamsulosin  (FLOMAX ) 0.4 MG CAPS capsule   tiZANidine  (ZANAFLEX ) 2 MG tablet   traMADol (ULTRAM) 50 MG tablet   Burnard CHRISTELLA Senna, PA-C [facility] Surgical Short Stay/Anesthesiology [facility] Phone [phone] 05/16/2024 1:42 PM

## 2024-05-16 NOTE — Anesthesia Preprocedure Evaluation (Signed)
 Anesthesia Evaluation  Patient identified by MRN, date of birth, ID band Patient awake    Reviewed: Allergy & Precautions, NPO status , reviewed documented beta blocker date and time , Unable to perform ROS - Chart review only  Airway Mallampati: III  TM Distance: >3 FB Neck ROM: Full    Dental  (+) Teeth Intact, Dental Advisory Given   Pulmonary Current SmokerPatient did not abstain from smoking.   Pulmonary exam normal breath sounds clear to auscultation       Cardiovascular hypertension, Pt. on medications and Pt. on home beta blockers Normal cardiovascular exam Rhythm:Regular Rate:Normal     Neuro/Psych  PSYCHIATRIC DISORDERS  Depression       GI/Hepatic ,GERD  Medicated,,  Endo/Other  diabetes, Type 2, Insulin  Dependent    Renal/GU ESRF and DialysisRenal disease (TTHSAT)     Musculoskeletal   Abdominal   Peds  Hematology   Anesthesia Other Findings   Reproductive/Obstetrics                             Anesthesia Physical Anesthesia Plan  ASA: 3  Anesthesia Plan: General   Post-op Pain Management: Tylenol  PO (pre-op )*   Induction: Intravenous  PONV Risk Score and Plan: 1 and Dexamethasone , Ondansetron  and Midazolam   Airway Management Planned: LMA  Additional Equipment:   Intra-op Plan:   Post-operative Plan: Extubation in OR  Informed Consent: I have reviewed the patients History and Physical, chart, labs and discussed the procedure including the risks, benefits and alternatives for the proposed anesthesia with the patient or authorized representative who has indicated his/her understanding and acceptance.     Dental advisory given  Plan Discussed with: CRNA  Anesthesia Plan Comments: (See PAT note from 6/24)        Anesthesia Quick Evaluation

## 2024-05-17 ENCOUNTER — Ambulatory Visit (HOSPITAL_COMMUNITY): Admitting: Medical

## 2024-05-17 ENCOUNTER — Encounter (HOSPITAL_COMMUNITY): Payer: Self-pay | Admitting: Surgery

## 2024-05-17 ENCOUNTER — Other Ambulatory Visit (HOSPITAL_COMMUNITY): Payer: Self-pay

## 2024-05-17 ENCOUNTER — Encounter (HOSPITAL_COMMUNITY)
Admission: RE | Disposition: A | Discharge status: Home / Self Care | Payer: Self-pay | Source: Ambulatory Visit | Attending: Surgery

## 2024-05-17 ENCOUNTER — Ambulatory Visit (HOSPITAL_COMMUNITY)
Admission: RE | Admit: 2024-05-17 | Discharge: 2024-05-17 | Disposition: A | Discharge status: Home / Self Care | Source: Ambulatory Visit | Attending: Surgery | Admitting: Surgery

## 2024-05-17 ENCOUNTER — Other Ambulatory Visit: Payer: Self-pay

## 2024-05-17 DIAGNOSIS — Z79899 Other long term (current) drug therapy: Secondary | ICD-10-CM | POA: Diagnosis not present

## 2024-05-17 DIAGNOSIS — Z794 Long term (current) use of insulin: Secondary | ICD-10-CM | POA: Insufficient documentation

## 2024-05-17 DIAGNOSIS — N186 End stage renal disease: Secondary | ICD-10-CM

## 2024-05-17 DIAGNOSIS — F32A Depression, unspecified: Secondary | ICD-10-CM | POA: Diagnosis not present

## 2024-05-17 DIAGNOSIS — E1122 Type 2 diabetes mellitus with diabetic chronic kidney disease: Secondary | ICD-10-CM | POA: Diagnosis not present

## 2024-05-17 DIAGNOSIS — I120 Hypertensive chronic kidney disease with stage 5 chronic kidney disease or end stage renal disease: Secondary | ICD-10-CM | POA: Diagnosis not present

## 2024-05-17 DIAGNOSIS — T82898A Other specified complication of vascular prosthetic devices, implants and grafts, initial encounter: Secondary | ICD-10-CM | POA: Diagnosis not present

## 2024-05-17 DIAGNOSIS — T8131XA Disruption of external operation (surgical) wound, not elsewhere classified, initial encounter: Secondary | ICD-10-CM | POA: Diagnosis not present

## 2024-05-17 DIAGNOSIS — Z9889 Other specified postprocedural states: Secondary | ICD-10-CM

## 2024-05-17 DIAGNOSIS — Z89511 Acquired absence of right leg below knee: Secondary | ICD-10-CM | POA: Diagnosis not present

## 2024-05-17 DIAGNOSIS — Z992 Dependence on renal dialysis: Secondary | ICD-10-CM | POA: Diagnosis not present

## 2024-05-17 DIAGNOSIS — E1142 Type 2 diabetes mellitus with diabetic polyneuropathy: Secondary | ICD-10-CM | POA: Insufficient documentation

## 2024-05-17 DIAGNOSIS — E1151 Type 2 diabetes mellitus with diabetic peripheral angiopathy without gangrene: Secondary | ICD-10-CM | POA: Insufficient documentation

## 2024-05-17 DIAGNOSIS — K219 Gastro-esophageal reflux disease without esophagitis: Secondary | ICD-10-CM | POA: Diagnosis not present

## 2024-05-17 DIAGNOSIS — X58XXXA Exposure to other specified factors, initial encounter: Secondary | ICD-10-CM | POA: Diagnosis not present

## 2024-05-17 DIAGNOSIS — T8189XA Other complications of procedures, not elsewhere classified, initial encounter: Secondary | ICD-10-CM | POA: Diagnosis not present

## 2024-05-17 DIAGNOSIS — I471 Supraventricular tachycardia, unspecified: Secondary | ICD-10-CM | POA: Insufficient documentation

## 2024-05-17 DIAGNOSIS — D631 Anemia in chronic kidney disease: Secondary | ICD-10-CM | POA: Diagnosis not present

## 2024-05-17 HISTORY — PX: INCISION AND DRAINAGE OF WOUND: SHX1803

## 2024-05-17 LAB — POCT I-STAT, CHEM 8
BUN: 33 mg/dL — ABNORMAL HIGH (ref 6–20)
Calcium, Ion: 0.94 mmol/L — ABNORMAL LOW (ref 1.15–1.40)
Chloride: 103 mmol/L (ref 98–111)
Creatinine, Ser: 6.9 mg/dL — ABNORMAL HIGH (ref 0.61–1.24)
Glucose, Bld: 193 mg/dL — ABNORMAL HIGH (ref 70–99)
HCT: 29 % — ABNORMAL LOW (ref 39.0–52.0)
Hemoglobin: 9.9 g/dL — ABNORMAL LOW (ref 13.0–17.0)
Potassium: 4.7 mmol/L (ref 3.5–5.1)
Sodium: 135 mmol/L (ref 135–145)
TCO2: 22 mmol/L (ref 22–32)

## 2024-05-17 LAB — GLUCOSE, CAPILLARY
Glucose-Capillary: 204 mg/dL — ABNORMAL HIGH (ref 70–99)
Glucose-Capillary: 177 mg/dL — ABNORMAL HIGH (ref 70–99)
Glucose-Capillary: 186 mg/dL — ABNORMAL HIGH (ref 70–99)

## 2024-05-17 SURGERY — IRRIGATION AND DEBRIDEMENT WOUND
Anesthesia: General | Laterality: Left

## 2024-05-17 MED ORDER — PROPOFOL 10 MG/ML IV BOLUS
INTRAVENOUS | Status: DC | PRN
  Administered 2024-05-17: 150 mg via INTRAVENOUS

## 2024-05-17 MED ORDER — INSULIN ASPART 100 UNIT/ML IJ SOLN
0.0000 [IU] | INTRAMUSCULAR | Status: DC | PRN
  Administered 2024-05-17: 2 [IU] via SUBCUTANEOUS

## 2024-05-17 MED ORDER — LIDOCAINE 2% (20 MG/ML) 5 ML SYRINGE
INTRAMUSCULAR | Status: AC
  Filled 2024-05-17: qty 5

## 2024-05-17 MED ORDER — MIDAZOLAM HCL 2 MG/2ML IJ SOLN
INTRAMUSCULAR | Status: AC
  Filled 2024-05-17: qty 2

## 2024-05-17 MED ORDER — OXYCODONE HCL 5 MG PO TABS
5.0000 mg | ORAL_TABLET | Freq: Once | ORAL | Status: AC
  Administered 2024-05-17: 5 mg via ORAL

## 2024-05-17 MED ORDER — CHLORHEXIDINE GLUCONATE CLOTH 2 % EX PADS: 6.0000 | MEDICATED_PAD | Freq: Every day | CUTANEOUS | Status: DC

## 2024-05-17 MED ORDER — SODIUM CHLORIDE 0.9% FLUSH: 10.0000 mL | Freq: Two times a day (BID) | INTRAVENOUS | Status: DC

## 2024-05-17 MED ORDER — ONDANSETRON HCL 4 MG/2ML IJ SOLN
INTRAMUSCULAR | Status: DC | PRN
  Administered 2024-05-17: 4 mg via INTRAVENOUS

## 2024-05-17 MED ORDER — FENTANYL CITRATE (PF) 100 MCG/2ML IJ SOLN
25.0000 ug | INTRAMUSCULAR | Status: DC | PRN
  Administered 2024-05-17 (×2): 50 ug via INTRAVENOUS

## 2024-05-17 MED ORDER — VANCOMYCIN HCL 500 MG IV SOLR
INTRAVENOUS | Status: AC
  Filled 2024-05-17: qty 10

## 2024-05-17 MED ORDER — FENTANYL CITRATE (PF) 250 MCG/5ML IJ SOLN
INTRAMUSCULAR | Status: DC | PRN
  Administered 2024-05-17: 50 ug via INTRAVENOUS

## 2024-05-17 MED ORDER — PROPOFOL 10 MG/ML IV BOLUS
INTRAVENOUS | Status: AC
Start: 2024-05-17 — End: 2024-05-17
  Filled 2024-05-17: qty 20

## 2024-05-17 MED ORDER — OXYCODONE HCL 5 MG PO TABS
ORAL_TABLET | ORAL | Status: AC
  Filled 2024-05-17: qty 1

## 2024-05-17 MED ORDER — HEPARIN SODIUM (PORCINE) 1000 UNIT/ML IJ SOLN
INTRAMUSCULAR | Status: AC
  Filled 2024-05-17: qty 2

## 2024-05-17 MED ORDER — PHENYLEPHRINE HCL-NACL 20-0.9 MG/250ML-% IV SOLN
INTRAVENOUS | Status: DC | PRN
  Administered 2024-05-17: 20 ug/min via INTRAVENOUS

## 2024-05-17 MED ORDER — SODIUM CHLORIDE 0.9% FLUSH: 10.0000 mL | INTRAVENOUS | Status: DC | PRN

## 2024-05-17 MED ORDER — LIDOCAINE 2% (20 MG/ML) 5 ML SYRINGE
INTRAMUSCULAR | Status: DC | PRN
  Administered 2024-05-17: 100 mg via INTRAVENOUS

## 2024-05-17 MED ORDER — CHLORHEXIDINE GLUCONATE 4 % EX SOLN: 60.0000 mL | Freq: Once | CUTANEOUS | Status: DC

## 2024-05-17 MED ORDER — ONDANSETRON HCL 4 MG/2ML IJ SOLN: 4.0000 mg | Freq: Once | INTRAMUSCULAR | Status: DC | PRN

## 2024-05-17 MED ORDER — GENTAMICIN SULFATE 40 MG/ML IJ SOLN
INTRAMUSCULAR | Status: AC
  Filled 2024-05-17: qty 4

## 2024-05-17 MED ORDER — SODIUM CHLORIDE 0.9% FLUSH: 3.0000 mL | INTRAVENOUS | Status: DC | PRN

## 2024-05-17 MED ORDER — ONDANSETRON HCL 4 MG/2ML IJ SOLN
INTRAMUSCULAR | Status: AC
  Filled 2024-05-17: qty 2

## 2024-05-17 MED ORDER — FENTANYL CITRATE (PF) 250 MCG/5ML IJ SOLN
INTRAMUSCULAR | Status: AC
  Filled 2024-05-17: qty 5

## 2024-05-17 MED ORDER — HEMOSTATIC AGENTS (NO CHARGE) OPTIME
TOPICAL | Status: DC | PRN
  Administered 2024-05-17: 1 via TOPICAL

## 2024-05-17 MED ORDER — HYDROCODONE-ACETAMINOPHEN 5-325 MG PO TABS
1.0000 | ORAL_TABLET | Freq: Four times a day (QID) | ORAL | 0 refills | Status: DC | PRN
  Filled 2024-05-17: qty 10, 3d supply, fill #0

## 2024-05-17 MED ORDER — HEPARIN SODIUM (PORCINE) 1000 UNIT/ML IJ SOLN
1900.0000 [IU] | Freq: Once | INTRAMUSCULAR | Status: AC
  Administered 2024-05-17: 1900 [IU]

## 2024-05-17 MED ORDER — SODIUM CHLORIDE 0.9 % IV SOLN: INTRAVENOUS | Status: DC

## 2024-05-17 MED ORDER — EPHEDRINE SULFATE (PRESSORS) 50 MG/ML IJ SOLN
INTRAMUSCULAR | Status: DC | PRN
  Administered 2024-05-17: 5 mg via INTRAVENOUS
  Administered 2024-05-17 (×2): 10 mg via INTRAVENOUS

## 2024-05-17 MED ORDER — ACETAMINOPHEN 500 MG PO TABS
1000.0000 mg | ORAL_TABLET | Freq: Once | ORAL | Status: AC
  Administered 2024-05-17: 1000 mg via ORAL
  Filled 2024-05-17: qty 2

## 2024-05-17 MED ORDER — FENTANYL CITRATE (PF) 100 MCG/2ML IJ SOLN
INTRAMUSCULAR | Status: AC
  Filled 2024-05-17: qty 2

## 2024-05-17 MED ORDER — CEFAZOLIN SODIUM-DEXTROSE 2-4 GM/100ML-% IV SOLN
2.0000 g | INTRAVENOUS | Status: AC
  Administered 2024-05-17: 2 g via INTRAVENOUS
  Filled 2024-05-17: qty 100

## 2024-05-17 MED ORDER — CHLORHEXIDINE GLUCONATE 0.12 % MT SOLN
15.0000 mL | Freq: Once | OROMUCOSAL | Status: AC
  Administered 2024-05-17: 15 mL via OROMUCOSAL
  Filled 2024-05-17: qty 15

## 2024-05-17 MED ORDER — ORAL CARE MOUTH RINSE
15.0000 mL | Freq: Once | OROMUCOSAL | Status: AC
Start: 2024-05-17 — End: 2024-05-17

## 2024-05-17 SURGICAL SUPPLY — 33 items
BAG COUNTER SPONGE SURGICOUNT (BAG) ×1 IMPLANT
BNDG ELASTIC 6INX 5YD STR LF (GAUZE/BANDAGES/DRESSINGS) IMPLANT
BNDG GAUZE DERMACEA FLUFF 4 (GAUZE/BANDAGES/DRESSINGS) IMPLANT
CANISTER SUCTION 3000ML PPV (SUCTIONS) ×1 IMPLANT
CLIP TI MEDIUM 6 (CLIP) ×1 IMPLANT
CLIP TI WIDE RED SMALL 6 (CLIP) ×1 IMPLANT
COVER SURGICAL LIGHT HANDLE (MISCELLANEOUS) ×1 IMPLANT
DRAPE HALF SHEET 40X57 (DRAPES) IMPLANT
DRAPE U-SHAPE 76X120 STRL (DRAPES) IMPLANT
DRESSING PEEL AND PLC PRVNA 13 (GAUZE/BANDAGES/DRESSINGS) IMPLANT
ELECTRODE REM PT RTRN 9FT ADLT (ELECTROSURGICAL) ×1 IMPLANT
GAUZE SPONGE 4X4 12PLY STRL (GAUZE/BANDAGES/DRESSINGS) ×1 IMPLANT
GAUZE XEROFORM 5X9 LF (GAUZE/BANDAGES/DRESSINGS) IMPLANT
GLOVE SURG SS PI 7.5 STRL IVOR (GLOVE) ×3 IMPLANT
GOWN STRL REUS W/ TWL LRG LVL3 (GOWN DISPOSABLE) ×2 IMPLANT
GOWN STRL REUS W/ TWL XL LVL3 (GOWN DISPOSABLE) ×1 IMPLANT
KIT BASIN OR (CUSTOM PROCEDURE TRAY) ×1 IMPLANT
KIT DRSG PREVENA PLUS 7DAY 125 (MISCELLANEOUS) IMPLANT
KIT TURNOVER KIT B (KITS) ×1 IMPLANT
NS IRRIG 1000ML POUR BTL (IV SOLUTION) ×1 IMPLANT
PACK CV ACCESS (CUSTOM PROCEDURE TRAY) IMPLANT
PACK GENERAL/GYN (CUSTOM PROCEDURE TRAY) ×1 IMPLANT
PACK UNIVERSAL I (CUSTOM PROCEDURE TRAY) ×1 IMPLANT
PAD ARMBOARD POSITIONER FOAM (MISCELLANEOUS) ×2 IMPLANT
SET HNDPC FAN SPRY TIP SCT (DISPOSABLE) IMPLANT
SURGIFLO W/THROMBIN 8M KIT (HEMOSTASIS) IMPLANT
SUT ETHILON 3 0 PS 1 (SUTURE) IMPLANT
SUT VIC AB 2-0 CTX 36 (SUTURE) IMPLANT
SUT VIC AB 3-0 SH 27X BRD (SUTURE) IMPLANT
SUT VIC AB 4-0 PS2 18 (SUTURE) IMPLANT
TIP FAN IRRIG PULSAVAC PLUS (DISPOSABLE) IMPLANT
TOWEL GREEN STERILE (TOWEL DISPOSABLE) ×1 IMPLANT
WATER STERILE IRR 1000ML POUR (IV SOLUTION) ×1 IMPLANT

## 2024-05-17 NOTE — Op Note (Signed)
    Patient name: [name] [name] MRN: [id number] DOB: 03-May-1971 Sex: male  05/17/2024 Pre-operative Diagnosis: ESRD Post-operative diagnosis:  Same Surgeon:  Malvina New Assistants:  Adina Sender, PA Procedure:   #!:  I&D left arm fistula   #2:  wound vac Anesthesia:  General Blood Loss:  minimal Specimens: Cultures were sent to micro  Findings: No obvious source of lymphatic leak.  A capsule like portion of the incision was removed with cautery.  I closed the skin with a running nylon and a Prevena wound VAC  Indications: This is a 53 year old gentleman with a draining incision from a basilic vein transposition.  He failed outpatient treatment and comes in today for surgical exploration.  Procedure:  The patient was identified in the holding area and taken to [facility] OR ROOM 16  The patient was then placed supine on the table. general anesthesia was administered.  The patient was prepped and draped in the usual sterile fashion.  A time out was called and antibiotics were administered.  The patient's incision in the antecubital was opened with a 10 blade.  There was a significant amount of clear lymphatic fluid draining.  I did send cultures of this fluid.  There was no obvious location of the leak.  Therefore I used cautery to remove what was starting to become a capsule.  Once this was removed the wound was irrigated.  Cautery was used on anything that remotely resembled leaking.  I then inserted Surgiflo into the wound and monitor this for 10+ minutes.  There was no active oozing or leaking from the incision.  I tried to close the incision with a deep layer of Vicryl however the tissue was not strong enough to hold the suture and so I elected to close the incision with a 3-0 nylon suture.  A Prevena wound VAC was placed over the incision.   Disposition: To PACU stable   V. Malvina New, M.D., [facility] [place] Office: [phone] Pager:  [phone]

## 2024-05-17 NOTE — Discharge Instructions (Addendum)
 Remove vac after 7 days.

## 2024-05-17 NOTE — Interval H&P Note (Signed)
 History and Physical Interval Note:  05/17/2024 11:24 AM  [name] [name]  has presented today for surgery, with the diagnosis of ESRD.  The various methods of treatment have been discussed with the patient and family. After consideration of risks, benefits and other options for treatment, the patient has consented to  Procedure(s) with comments: IRRIGATION AND DEBRIDEMENT WOUND (Left) - Arm as a surgical intervention.  The patient's history has been reviewed, patient examined, no change in status, stable for surgery.  I have reviewed the patient's chart and labs.  Questions were answered to the patient's satisfaction.     Malvina New

## 2024-05-17 NOTE — Anesthesia Postprocedure Evaluation (Signed)
 Anesthesia Post Note  Patient: [name] [name]  Procedure(s) Performed: IRRIGATION AND DEBRIDEMENT WOUND (Left)     Patient location during evaluation: PACU Anesthesia Type: General Level of consciousness: awake and alert Pain management: pain level controlled Vital Signs Assessment: post-procedure vital signs reviewed and stable Respiratory status: spontaneous breathing, nonlabored ventilation and respiratory function stable Cardiovascular status: blood pressure returned to baseline and stable Postop Assessment: no apparent nausea or vomiting Anesthetic complications: no   No notable events documented.  Last Vitals:  Vitals:   05/17/24 1445 05/17/24 1500  BP: (!) 181/89 (!) 176/91  Pulse: 83 81  Resp: (!) 24 17  Temp:  36.4 C  SpO2: 91% (!) 89%    Last Pain:  Vitals:   05/17/24 1445  TempSrc:   PainSc: 8                  Garnette FORBES [place]ern

## 2024-05-17 NOTE — Transfer of Care (Signed)
 Immediate Anesthesia Transfer of Care Note  Patient: [name] [name]  Procedure(s) Performed: IRRIGATION AND DEBRIDEMENT WOUND (Left)  Patient Location: PACU  Anesthesia Type:General  Level of Consciousness: awake and alert   Airway & Oxygen Therapy: Patient Spontanous Breathing and Patient connected to nasal cannula oxygen  Post-op Assessment: Report given to RN and Post -op Vital signs reviewed and stable  Post vital signs: Reviewed and stable  Last Vitals:  Vitals Value Taken Time  BP 167/83 05/17/24 13:40  Temp    Pulse 84 05/17/24 13:43  Resp 17 05/17/24 13:42  SpO2 94 % 05/17/24 13:43  Vitals shown include unfiled device data.  Last Pain:  Vitals:   05/17/24 1105  TempSrc:   PainSc: 8       Patients Stated Pain Goal: 1 (05/17/24 1105)  Complications: No notable events documented.

## 2024-05-17 NOTE — Anesthesia Procedure Notes (Addendum)
 Procedure Name: LMA Insertion Date/Time: 05/17/2024 12:40 PM  Performed by: Atanacio Arland HERO, CRNAPre-anesthesia Checklist: Patient identified, Emergency Drugs available, Suction available and Patient being monitored Patient Re-evaluated:Patient Re-evaluated prior to induction Oxygen Delivery Method: Circle System Utilized Preoxygenation: Pre-oxygenation with 100% oxygen Induction Type: IV induction Ventilation: Mask ventilation without difficulty LMA: LMA inserted LMA Size: 4.0 and 5.0 Number of attempts: 1 Airway Equipment and Method: Bite block Placement Confirmation: positive ETCO2 Tube secured with: Tape Dental Injury: Teeth and Oropharynx as per pre-operative assessment  Comments: Placed by [employer]

## 2024-05-18 ENCOUNTER — Encounter (HOSPITAL_COMMUNITY): Payer: Self-pay | Admitting: Surgery

## 2024-05-18 ENCOUNTER — Telehealth: Payer: Self-pay

## 2024-05-18 ENCOUNTER — Encounter

## 2024-05-18 DIAGNOSIS — N186 End stage renal disease: Secondary | ICD-10-CM | POA: Diagnosis not present

## 2024-05-18 DIAGNOSIS — T8249XA Other complication of vascular dialysis catheter, initial encounter: Secondary | ICD-10-CM | POA: Diagnosis not present

## 2024-05-18 DIAGNOSIS — Z992 Dependence on renal dialysis: Secondary | ICD-10-CM | POA: Diagnosis not present

## 2024-05-18 DIAGNOSIS — D689 Coagulation defect, unspecified: Secondary | ICD-10-CM | POA: Diagnosis not present

## 2024-05-18 DIAGNOSIS — D631 Anemia in chronic kidney disease: Secondary | ICD-10-CM | POA: Diagnosis not present

## 2024-05-18 DIAGNOSIS — N2581 Secondary hyperparathyroidism of renal origin: Secondary | ICD-10-CM | POA: Diagnosis not present

## 2024-05-18 DIAGNOSIS — I132 Hypertensive heart and chronic kidney disease with heart failure and with stage 5 chronic kidney disease, or end stage renal disease: Secondary | ICD-10-CM | POA: Diagnosis not present

## 2024-05-18 NOTE — Telephone Encounter (Signed)
 Pt called [facility] regarding some blood on wound vac sponge and in tubing. Patient was assured that this was normal.  Patient was directed to check the amount of blood/ fluid in the wound vac canister and he stated the canister was empty.

## 2024-05-19 ENCOUNTER — Encounter (HOSPITAL_COMMUNITY): Payer: Self-pay

## 2024-05-20 ENCOUNTER — Other Ambulatory Visit: Payer: Self-pay

## 2024-05-20 ENCOUNTER — Emergency Department (HOSPITAL_COMMUNITY)
Admission: EM | Admit: 2024-05-20 | Discharge: 2024-05-20 | Disposition: A | Discharge status: Home / Self Care | Attending: Emergency Medicine | Admitting: Emergency Medicine

## 2024-05-20 ENCOUNTER — Encounter (HOSPITAL_COMMUNITY): Payer: Self-pay

## 2024-05-20 DIAGNOSIS — T82838A Hemorrhage of vascular prosthetic devices, implants and grafts, initial encounter: Secondary | ICD-10-CM | POA: Diagnosis not present

## 2024-05-20 DIAGNOSIS — D689 Coagulation defect, unspecified: Secondary | ICD-10-CM | POA: Diagnosis not present

## 2024-05-20 DIAGNOSIS — Y658 Other specified misadventures during surgical and medical care: Secondary | ICD-10-CM | POA: Insufficient documentation

## 2024-05-20 DIAGNOSIS — E1322 Other specified diabetes mellitus with diabetic chronic kidney disease: Secondary | ICD-10-CM | POA: Diagnosis not present

## 2024-05-20 DIAGNOSIS — D631 Anemia in chronic kidney disease: Secondary | ICD-10-CM | POA: Diagnosis not present

## 2024-05-20 DIAGNOSIS — N186 End stage renal disease: Secondary | ICD-10-CM | POA: Diagnosis not present

## 2024-05-20 DIAGNOSIS — T8249XA Other complication of vascular dialysis catheter, initial encounter: Secondary | ICD-10-CM | POA: Diagnosis not present

## 2024-05-20 DIAGNOSIS — T82898A Other specified complication of vascular prosthetic devices, implants and grafts, initial encounter: Secondary | ICD-10-CM | POA: Diagnosis not present

## 2024-05-20 DIAGNOSIS — Z794 Long term (current) use of insulin: Secondary | ICD-10-CM | POA: Insufficient documentation

## 2024-05-20 DIAGNOSIS — Z79899 Other long term (current) drug therapy: Secondary | ICD-10-CM | POA: Diagnosis not present

## 2024-05-20 DIAGNOSIS — I132 Hypertensive heart and chronic kidney disease with heart failure and with stage 5 chronic kidney disease, or end stage renal disease: Secondary | ICD-10-CM | POA: Diagnosis not present

## 2024-05-20 DIAGNOSIS — Z9889 Other specified postprocedural states: Secondary | ICD-10-CM | POA: Diagnosis not present

## 2024-05-20 DIAGNOSIS — F17210 Nicotine dependence, cigarettes, uncomplicated: Secondary | ICD-10-CM | POA: Diagnosis not present

## 2024-05-20 DIAGNOSIS — N2581 Secondary hyperparathyroidism of renal origin: Secondary | ICD-10-CM | POA: Diagnosis not present

## 2024-05-20 DIAGNOSIS — Z5189 Encounter for other specified aftercare: Secondary | ICD-10-CM | POA: Insufficient documentation

## 2024-05-20 DIAGNOSIS — Z4801 Encounter for change or removal of surgical wound dressing: Secondary | ICD-10-CM | POA: Diagnosis not present

## 2024-05-20 DIAGNOSIS — Z992 Dependence on renal dialysis: Secondary | ICD-10-CM | POA: Diagnosis not present

## 2024-05-20 LAB — CBC
HCT: 30.5 % — ABNORMAL LOW (ref 39.0–52.0)
Hemoglobin: 9.1 g/dL — ABNORMAL LOW (ref 13.0–17.0)
MCH: 24.4 pg — ABNORMAL LOW (ref 26.0–34.0)
MCHC: 29.8 g/dL — ABNORMAL LOW (ref 30.0–36.0)
MCV: 81.8 fL (ref 80.0–100.0)
Platelets: 141 10*3/uL — ABNORMAL LOW (ref 150–400)
RBC: 3.73 MIL/uL — ABNORMAL LOW (ref 4.22–5.81)
RDW: 18.9 % — ABNORMAL HIGH (ref 11.5–15.5)
WBC: 8.9 10*3/uL (ref 4.0–10.5)
nRBC: 0 % (ref 0.0–0.2)

## 2024-05-20 LAB — BASIC METABOLIC PANEL WITH GFR
Anion gap: 11 (ref 5–15)
BUN: 14 mg/dL (ref 6–20)
CO2: 26 mmol/L (ref 22–32)
Calcium: 8.8 mg/dL — ABNORMAL LOW (ref 8.9–10.3)
Chloride: 100 mmol/L (ref 98–111)
Creatinine, Ser: 4.12 mg/dL — ABNORMAL HIGH (ref 0.61–1.24)
GFR, Estimated: 17 mL/min — ABNORMAL LOW (ref >60)
Glucose, Bld: 196 mg/dL — ABNORMAL HIGH (ref 70–99)
Potassium: 3.8 mmol/L (ref 3.5–5.1)
Sodium: 137 mmol/L (ref 135–145)

## 2024-05-20 NOTE — Consult Note (Signed)
 Hospital Consult    Reason for Consult:  prevena with excessive fluid  MRN #:  [id number]  History of Present Illness: This is a 54 y.o. male with ESRD on HD via a right IJ TDC.  He recently underwent a left arm I&D for seroma.  He has a left arm brachiobasilic fistula that was transposed on 04/26/2024.  He had continued serous drainage from the wound and underwent I&D with seroma drainage and Prevena VAC placement on 05/17/2024 with Dr. Serene.  He presents from the dialysis center today for evaluation given his significant drainage on a full Prevena VAC.  He denies any significant pain in the upper extremity and has normal function of his left hand.  He reports that the Prevena VAC canister just filled over the last few days since his surgery.  Past Medical History:  Diagnosis Date   Abdominal pain 11/30/2023   Abdominal tenderness 10/14/2023   Allergy    seasonal and environmental   Anemia    hx blood transfusion 04/27/24   Chest pain 12/01/2023   Chronic constipation 05/13/2020   Depression    Diabetes mellitus without complication (HCC) 2019   Dyspnea    hx - SOB with exertion   ESRD on hemodialysis (HCC)    Tues, Thurs, Sat   GERD (gastroesophageal reflux disease)    Hyperlipidemia    Hypertension    Necrotizing fasciitis (HCC) 10/13/2017   Neuromuscular disorder (HCC)    neuropathy   PAD (peripheral artery disease) (HCC)    Secondary hyperparathyroidism (HCC)    SVT (supraventricular tachycardia) (HCC)    hx   Wound dehiscence 11/24/2017    Past Surgical History:  Procedure Laterality Date   A/V FISTULAGRAM N/A 03/17/2024   Procedure: A/V Fistulagram;  Surgeon: Magda Debby SAILOR, MD;  Location: [facility] INVASIVE CV LAB;  Service: Cardiovascular;  Laterality: N/A;   AMPUTATION Right 10/13/2017   Procedure: RIGHT BELOW KNEE AMPUTATION;  Surgeon: Harden Jerona GAILS, MD;  Location: [facility] OR;  Service: Orthopedics;  Laterality: Right;   AMPUTATION Right 11/24/2017   Procedure:  AMPUTATION BELOW KNEE REVISION;  Surgeon: Harden Jerona GAILS, MD;  Location: [facility] OR;  Service: Orthopedics;  Laterality: Right;   AMPUTATION TOE Left    AV FISTULA PLACEMENT Left 10/19/2023   Procedure: LEFT BRACHIOCEPHALIC ARTERIOVENOUS (AV) FISTULA CREATION;  Surgeon: Pearline Norman RAMAN, MD;  Location: [facility] OR;  Service: Vascular;  Laterality: Left;   AV FISTULA PLACEMENT Left 01/26/2024   Procedure: LEFT ARTERIOVENOUS (AV) FISTULA CREATION;  Surgeon: Pearline Norman RAMAN, MD;  Location: [facility] OR;  Service: Vascular;  Laterality: Left;   BASCILIC VEIN TRANSPOSITION Left 04/26/2024   Procedure: LEFT SECOND STAGE TRANSPOSITION, VEIN, BASILIC;  Surgeon: Magda Debby SAILOR, MD;  Location[facility] OR;  Service: Vascular;  Laterality: Left;   BIOPSY  09/02/2021   Procedure: BIOPSY;  Surgeon: Federico Rosario BROCKS, MD;  Location: [facility] ENDOSCOPY;  Service: Gastroenterology;;   BIOPSY  02/18/2023   Procedure: BIOPSY;  Surgeon: Leigh Elspeth SQUIBB, MD;  Location: [facility] ENDOSCOPY;  Service: Gastroenterology;;   COLONOSCOPY  05/11/2019   COLONOSCOPY  2021   3 polyps - tubular adenomas   ESOPHAGOGASTRODUODENOSCOPY (EGD) WITH PROPOFOL  N/A 09/02/2021   Procedure: ESOPHAGOGASTRODUODENOSCOPY (EGD) WITH PROPOFOL ;  Surgeon: Federico Rosario BROCKS, MD;  Location: [facility] ENDOSCOPY;  Service: Gastroenterology;  Laterality: N/A;   ESOPHAGOGASTRODUODENOSCOPY (EGD) WITH PROPOFOL  N/A 02/18/2023   Procedure: ESOPHAGOGASTRODUODENOSCOPY (EGD) WITH PROPOFOL ;  Surgeon: Leigh Elspeth SQUIBB, MD;  Location: [facility] ENDOSCOPY;  Service: Gastroenterology;  Laterality: N/A;  INCISION AND DRAINAGE OF WOUND Left 05/17/2024   Procedure: IRRIGATION AND DEBRIDEMENT WOUND;  Surgeon: Serene Gaile ORN, MD;  Location: [facility] OR;  Service: Vascular;  Laterality: Left;  Arm   INTRAMEDULLARY (IM) NAIL INTERTROCHANTERIC Right 10/15/2023   Procedure: INTRAMEDULLARY (IM) NAIL INTERTROCHANTERIC;  Surgeon: Kendal Franky SQUIBB, MD;  Location: [facility] OR;  Service: Orthopedics;  Laterality: Right;   IR FLUORO  GUIDE CV LINE RIGHT  10/15/2023   IR US  GUIDE VASC ACCESS RIGHT  10/15/2023   POLYPECTOMY     WISDOM TOOTH EXTRACTION      Allergies  Allergen Reactions   Iron  Nausea And Vomiting    With the iron  tablets   Tape Rash    Rash at site of tape--okay to use paper tape    Prior to Admission medications   Medication Sig Start Date End Date Taking? Authorizing Provider  Accu-Chek Softclix Lancets lancets Use to check blood sugar twice per day. E11.9 03/15/24   Christia Budds, MD  acetaminophen  (TYLENOL ) 500 MG tablet Take 2 tablets (1,000 mg total) by mouth every 6 (six) hours as needed. 10/26/23   Baloch, Mahnoor, MD  albuterol  (VENTOLIN  HFA) 108 (90 Base) MCG/ACT inhaler Inhale 2 puffs into the lungs every 6 (six) hours as needed for wheezing or shortness of breath. 03/31/24   Christia Budds, MD  atorvastatin  (LIPITOR) 40 MG tablet TAKE 1 TABLET(40 MG) BY MOUTH AT BEDTIME 04/12/24   Orlando Pond, DO  Blood Glucose Monitoring Suppl (ACCU-CHEK GUIDE ME) w/Device KIT See admin instructions. 02/23/23   [provider]  calcitRIOL  (ROCALTROL ) 0.25 MCG capsule Take 1 capsule (0.25 mcg total) by mouth Every Tuesday,Thursday,and Saturday with dialysis. 10/26/23   Baloch, Mahnoor, MD  Calcium  Acetate 667 MG TABS Take 1,334 mg by mouth 3 (three) times daily.    [provider]  diltiazem  (CARDIZEM  CD) 300 MG 24 hr capsule TAKE 1 CAPSULE(300 MG) BY MOUTH DAILY 03/29/24   Christia Budds, MD  FLUoxetine  (PROZAC ) 20 MG capsule TAKE 1 CAPSULE(20 MG) BY MOUTH DAILY 05/01/24   Christia Budds, MD  fluticasone  (FLONASE ) 50 MCG/ACT nasal spray Place 2 sprays into both nostrils daily. 05/11/24   Christia Budds, MD  Fluticasone -Umeclidin-Vilant (TRELEGY ELLIPTA ) 100-62.5-25 MCG/ACT AEPB INHALE 1 PUFF INTO LUNGS DAILY 05/15/24   Christia Budds, MD  gabapentin  (NEURONTIN ) 300 MG capsule TAKE 1 CAPSULE(300 MG) BY MOUTH DAILY 04/24/24   Christia Budds, MD  glucose blood (AGAMATRIX PRESTO TEST) test  strip Check blood sugars twice daily before meals 02/02/18   Adella Norris, MD  glucose blood test strip 1 each by Other route as needed for other. Use as instructed    [provider]  hydrALAZINE  (APRESOLINE ) 50 MG tablet Take 1 tablet (50 mg total) by mouth 3 (three) times daily. 05/11/24   Christia Budds, MD  HYDROcodone -acetaminophen  (NORCO/VICODIN) 5-325 MG tablet Take 1 tablet by mouth every 6 (six) hours as needed for severe pain (pain score 7-10). 05/17/24   Bethanie Cough, PA-C  insulin  lispro (HUMALOG ) 100 UNIT/ML injection 0-6 Units, Subcutaneous, 3 times daily with meals, First dose on Sun 10/17/23 at 0800 Correction coverage: Very Sensitive (ESRD/Dialysis) CBG < 70: Implement Hypoglycemia Standing Orders and refer to Hypoglycemia Standing Orders sidebar report CBG 70 - 120: 0 units CBG 121 - 150: 0 units CBG 151 - 200: 1 unit CBG 201-250: 2 units CBG 251-300: 3 units CBG 301-350: 4 units CBG 351-400: 5 units CBG > 400: Give 6 units and call MD Patient taking differently: Inject  into the skin 2 (two) times daily. 0-6 Units, Subcutaneous, 3 times daily with meals, as needed  Correction coverage: Very Sensitive (ESRD/Dialysis) CBG < 70: Implement Hypoglycemia Standing Orders and refer to Hypoglycemia Standing Orders sidebar report CBG 70 - 120: 0 units CBG 121 - 150: 0 units CBG 151 - 200: 1 unit CBG 201-250: 2 units CBG 251-300: 3 units CBG 301-350: 4 units CBG 351-400: 5 units CBG > 400: Give 6 units and call MD 10/26/23   Lonnie, Mahnoor, MD  lidocaine  (HM LIDOCAINE  PATCH) 4 % Place 1 patch onto the skin daily. 11/29/23   Christia Budds, MD  losartan  (COZAAR ) 25 MG tablet TAKE 1 TABLET(25 MG) BY MOUTH DAILY 05/17/24   Christia Budds, MD  metoprolol  succinate (TOPROL -XL) 25 MG 24 hr tablet Take 25 mg by mouth daily. 01/13/24   [provider]  ondansetron  (ZOFRAN -ODT) 4 MG disintegrating tablet Take 1 tablet (4 mg total) by mouth every 8 (eight) hours  as needed for nausea or vomiting. 12/12/23   Christia Budds, MD  pantoprazole  (PROTONIX ) 40 MG tablet TAKE 1 TABLET(40 MG) BY MOUTH DAILY 05/11/24   Jagadish, Mayuri, MD  polyethylene glycol (MIRALAX  / GLYCOLAX ) 17 g packet Take 17 g by mouth daily. 04/06/24   Legrand Victory LITTIE DOUGLAS, MD  sevelamer  carbonate (RENVELA ) 800 MG tablet Take 2 tablets (1,600 mg total) by mouth 3 (three) times daily with meals. 10/26/23   Baloch, Mahnoor, MD  tamsulosin  (FLOMAX ) 0.4 MG CAPS capsule TAKE 1 CAPSULE(0.4 MG) BY MOUTH DAILY AFTER SUPPER 05/11/24   Christia Budds, MD  tiZANidine  (ZANAFLEX ) 2 MG tablet TAKE 1 TABLET(2 MG) BY MOUTH TWICE DAILY AS NEEDED FOR MUSCLE SPASMS 05/15/24   Christia Budds, MD  traMADol (ULTRAM) 50 MG tablet Take 50 mg by mouth 3 (three) times daily as needed. 05/12/24   [provider]    Social History   Socioeconomic History   Marital status: Single    Spouse name: Not on file   Number of children: 1   Years of education: 12   Highest education level: Associate degree: occupational, Scientist, product/process development, or vocational program  Occupational History   Occupation: unemployed  Tobacco Use   Smoking status: Every Day    Current packs/day: 0.50    Average packs/day: 0.5 packs/day for 36.0 years (18.0 ttl pk-yrs)    Types: Cigarettes    Start date: 11/23/1988   Smokeless tobacco: Never   Tobacco comments:    0.5 PPD- khj 04/12/2024        Started smoking at 53 years old    Smoked 1PPD at his heaviest  Vaping Use   Vaping status: Never Used  Substance and Sexual Activity   Alcohol use: No   Drug use: No   Sexual activity: Not Currently  Other Topics Concern   Not on file  Social History Narrative   Originally from  [place].   Is living with his mother, who essentially supports him now.   Social Drivers of Health   Financial Resource Strain: High Risk (10/30/2023)   Overall Financial Resource Strain (CARDIA)    Difficulty of Paying Living Expenses: Very hard  Food  Insecurity: Patient Declined (01/23/2024)   Hunger Vital Sign    Worried About Running Out of Food in the Last Year: Patient declined    Ran Out of Food in the Last Year: Patient declined  Recent Concern: Food Insecurity - Food Insecurity Present (12/01/2023)   Hunger Vital Sign    Worried About Running  Out of Food in the Last Year: Never true    Ran Out of Food in the Last Year: Sometimes true  Transportation Needs: Patient Declined (01/23/2024)   PRAPARE - Transportation    Lack of Transportation (Medical): Patient declined    Lack of Transportation (Non-Medical): Patient declined  Physical Activity: Unknown (10/30/2023)   Exercise Vital Sign    Days of Exercise per Week: Patient declined    Minutes of Exercise per Session: 0 min  Stress: Stress Concern Present (10/30/2023)   [employer] of Occupational Health - Occupational Stress Questionnaire    Feeling of Stress : To some extent  Social Connections: Patient Declined (01/23/2024)   Social Connection and Isolation Panel    Frequency of Communication with Friends and Family: Patient declined    Frequency of Social Gatherings with Friends and Family: Patient declined    Attends Religious Services: Patient declined    Database administrator or Organizations: Patient declined    Attends Banker Meetings: Patient declined    Marital Status: Patient declined  Recent Concern: Social Connections - Socially Isolated (01/23/2024)   Social Connection and Isolation Panel    Frequency of Communication with Friends and Family: More than three times a week    Frequency of Social Gatherings with Friends and Family: Never    Attends Religious Services: Never    Database administrator or Organizations: No    Attends Banker Meetings: Never    Marital Status: Never married  Intimate Partner Violence: Not At Risk (03/26/2024)   Received from [facility]   HITS    Over the last 12 months how often did your partner physically  hurt you?: Never    Over the last 12 months how often did your partner insult you or talk down to you?: Never    Over the last 12 months how often did your partner threaten you with physical harm?: Never    Over the last 12 months how often did your partner scream or curse at you?: Never    Family History  Problem Relation Age of Onset   Diabetes Mellitus II Mother    Depression Mother    Colon polyps Mother    Hypertension Mother    High Cholesterol Mother    Heart disease Father        CABG x 4.  04/2017   Diabetes Father    High Cholesterol Father    Hypertension Father    Heart disease Maternal Grandmother    Heart disease Maternal Grandfather    Colon cancer Neg Hx    Esophageal cancer Neg Hx    Liver cancer Neg Hx    Pancreatic cancer Neg Hx    Rectal cancer Neg Hx    Stomach cancer Neg Hx     ROS: Otherwise negative unless mentioned in HPI  Physical Examination  Vitals:   05/20/24 1217  BP: (!) 203/103  Pulse: 95  Resp: 16  Temp: 98.5 F (36.9 C)  SpO2: 98%   Body mass index is 28.46 kg/m.  General: no acute distress Cardiac: hemodynamically stable Pulm: normal work of breathing Abdomen: non-tender, no pulsatile mass  Neuro: alert, no focal deficit Extremities: Left arm with Prevena VAC on suction, full canister, palpable thrill in brachiobasilic fistula, no edema Prevena VAC canister with red-tinged serous drainage    ASSESSMENT/PLAN: This is a 53 y.o. male with ESRD on HD via a right IJ TDC who has a left brachiobasilic  fistula recently underwent I&D for seroma.  I remove the Prevena VAC and there was some scant serous drainage coming from the wound.  The wound edges were well-approximated.  The arm was then dressed with an ABD pad, Kerlix and an Ace bandage.  He was instructed to keep this on for least 2 days to keep pressure on the site. Okay for discharge from ED from surgical perspective. Will plan to continue with original scheduled  follow-up   Norman GORMAN Serve MD [facility] [phone] 05/20/2024  2:37 PM

## 2024-05-20 NOTE — ED Notes (Signed)
 Pt in bed, pt states that he had surgery on Wednesday, states that he is here because his suction can is full. Pt offers no other complaints

## 2024-05-20 NOTE — ED Triage Notes (Signed)
 Pt had surgery on left arm fistula 3 days ago. Pt has wound vac to left arm. Pt states he has had large amount of blood from wound and was told to come to ED for evaluation. Pt has pain in left arm and tingling. Pt has had shortness of breath and weakness.

## 2024-05-20 NOTE — ED Notes (Signed)
 Pt in bed, pt reports 8/10 arm pain, pt has new dressing in place, placed by vasc surgery, pt states that he is ready to go home, pt verbalized understanding dc and follow up, pt from department.

## 2024-05-20 NOTE — Discharge Instructions (Addendum)
 Follow up with your vascular surgeon in the office.

## 2024-05-20 NOTE — ED Provider Notes (Signed)
 [name] EMERGENCY DEPARTMENT AT [facility] Provider Note   CSN: [id number] Arrival date & time: 05/20/24  1206     Patient presents with: Post-op Problem   CLARIS [name] is a 53 y.o. male.   53 yo M with a chief complaints of bleeding from his AV fistula.  Patient had a revision done recently.  [name] has a wound VAC in place.  [name] thinks [name] has had about 200 to 250 cc of bloody output from the fistula.  [name] went to dialysis today and they were concerned and had him called the vascular surgery office.  They encouraged him to come to the ED for evaluation.  [name] has been getting dialysis through a tunneled dialysis catheter.  Has had no issues with that.        Prior to Admission medications   Medication Sig Start Date End Date Taking? Authorizing Provider  Accu-Chek Softclix Lancets lancets Use to check blood sugar twice per day. E11.9 03/15/24   Christia Budds, MD  acetaminophen  (TYLENOL ) 500 MG tablet Take 2 tablets (1,000 mg total) by mouth every 6 (six) hours as needed. 10/26/23   Baloch, Mahnoor, MD  albuterol  (VENTOLIN  HFA) 108 (90 Base) MCG/ACT inhaler Inhale 2 puffs into the lungs every 6 (six) hours as needed for wheezing or shortness of breath. 03/31/24   Christia Budds, MD  atorvastatin  (LIPITOR) 40 MG tablet TAKE 1 TABLET(40 MG) BY MOUTH AT BEDTIME 04/12/24   Orlando Pond, DO  Blood Glucose Monitoring Suppl (ACCU-CHEK GUIDE ME) w/Device KIT See admin instructions. 02/23/23   [provider]  calcitRIOL  (ROCALTROL ) 0.25 MCG capsule Take 1 capsule (0.25 mcg total) by mouth Every Tuesday,Thursday,and Saturday with dialysis. 10/26/23   Baloch, Mahnoor, MD  Calcium  Acetate 667 MG TABS Take 1,334 mg by mouth 3 (three) times daily.    [provider]  diltiazem  (CARDIZEM  CD) 300 MG 24 hr capsule TAKE 1 CAPSULE(300 MG) BY MOUTH DAILY 03/29/24   Christia Budds, MD  FLUoxetine  (PROZAC ) 20 MG capsule TAKE 1 CAPSULE(20 MG) BY MOUTH DAILY 05/01/24   Christia Budds, MD  fluticasone  (FLONASE ) 50 MCG/ACT nasal spray Place 2 sprays into both nostrils daily. 05/11/24   Christia Budds, MD  Fluticasone -Umeclidin-Vilant (TRELEGY ELLIPTA ) 100-62.5-25 MCG/ACT AEPB INHALE 1 PUFF INTO LUNGS DAILY 05/15/24   Christia Budds, MD  gabapentin  (NEURONTIN ) 300 MG capsule TAKE 1 CAPSULE(300 MG) BY MOUTH DAILY 04/24/24   Christia Budds, MD  glucose blood (AGAMATRIX PRESTO TEST) test strip Check blood sugars twice daily before meals 02/02/18   Adella Norris, MD  glucose blood test strip 1 each by Other route as needed for other. Use as instructed    [provider]  hydrALAZINE  (APRESOLINE ) 50 MG tablet Take 1 tablet (50 mg total) by mouth 3 (three) times daily. 05/11/24   Christia Budds, MD  HYDROcodone -acetaminophen  (NORCO/VICODIN) 5-325 MG tablet Take 1 tablet by mouth every 6 (six) hours as needed for severe pain (pain score 7-10). 05/17/24   Bethanie Cough, PA-C  insulin  lispro (HUMALOG ) 100 UNIT/ML injection 0-6 Units, Subcutaneous, 3 times daily with meals, First dose on Sun 10/17/23 at 0800 Correction coverage: Very Sensitive (ESRD/Dialysis) CBG < 70: Implement Hypoglycemia Standing Orders and refer to Hypoglycemia Standing Orders sidebar report CBG 70 - 120: 0 units CBG 121 - 150: 0 units CBG 151 - 200: 1 unit CBG 201-250: 2 units CBG 251-300: 3 units CBG 301-350: 4 units CBG 351-400: 5 units CBG > 400: Give 6 units and  call MD Patient taking differently: Inject into the skin 2 (two) times daily. 0-6 Units, Subcutaneous, 3 times daily with meals, as needed  Correction coverage: Very Sensitive (ESRD/Dialysis) CBG < 70: Implement Hypoglycemia Standing Orders and refer to Hypoglycemia Standing Orders sidebar report CBG 70 - 120: 0 units CBG 121 - 150: 0 units CBG 151 - 200: 1 unit CBG 201-250: 2 units CBG 251-300: 3 units CBG 301-350: 4 units CBG 351-400: 5 units CBG > 400: Give 6 units and call MD 10/26/23   Lonnie, Mahnoor, MD   lidocaine  (HM LIDOCAINE  PATCH) 4 % Place 1 patch onto the skin daily. 11/29/23   Christia Budds, MD  losartan  (COZAAR ) 25 MG tablet TAKE 1 TABLET(25 MG) BY MOUTH DAILY 05/17/24   Jagadish, Mayuri, MD  metoprolol  succinate (TOPROL -XL) 25 MG 24 hr tablet Take 25 mg by mouth daily. 01/13/24   [provider]  ondansetron  (ZOFRAN -ODT) 4 MG disintegrating tablet Take 1 tablet (4 mg total) by mouth every 8 (eight) hours as needed for nausea or vomiting. 12/12/23   Christia Budds, MD  pantoprazole  (PROTONIX ) 40 MG tablet TAKE 1 TABLET(40 MG) BY MOUTH DAILY 05/11/24   Jagadish, Mayuri, MD  polyethylene glycol (MIRALAX  / GLYCOLAX ) 17 g packet Take 17 g by mouth daily. 04/06/24   Legrand Victory LITTIE DOUGLAS, MD  sevelamer  carbonate (RENVELA ) 800 MG tablet Take 2 tablets (1,600 mg total) by mouth 3 (three) times daily with meals. 10/26/23   Baloch, Mahnoor, MD  tamsulosin  (FLOMAX ) 0.4 MG CAPS capsule TAKE 1 CAPSULE(0.4 MG) BY MOUTH DAILY AFTER SUPPER 05/11/24   Christia Budds, MD  tiZANidine  (ZANAFLEX ) 2 MG tablet TAKE 1 TABLET(2 MG) BY MOUTH TWICE DAILY AS NEEDED FOR MUSCLE SPASMS 05/15/24   Christia Budds, MD  traMADol (ULTRAM) 50 MG tablet Take 50 mg by mouth 3 (three) times daily as needed. 05/12/24   [provider]    Allergies: Iron  and Tape    Review of Systems  Updated Vital Signs BP (!) 197/93 (BP Location: Right Arm)   Pulse 91   Temp 98.2 F (36.8 C) (Oral)   Resp 17   Ht 6' 5 (1.956 m)   Wt 108.9 kg   SpO2 94%   BMI 28.46 kg/m   Physical Exam Vitals and nursing note reviewed.  Constitutional:      Appearance: [name] is well-developed.  HENT:     Head: Normocephalic and atraumatic.   Eyes:     Pupils: Pupils are equal, round, and reactive to light.   Neck:     Vascular: No JVD.   Cardiovascular:     Rate and Rhythm: Normal rate and regular rhythm.     Heart sounds: No murmur heard.    No friction rub. No gallop.  Pulmonary:     Effort: No respiratory distress.      Breath sounds: No wheezing.  Chest:     Comments: Tunneled dialysis catheter site without erythema warmth or drainage.  Abdominal:     General: There is no distension.     Tenderness: There is no abdominal tenderness. There is no guarding or rebound.   Musculoskeletal:        General: Normal range of motion.     Cervical back: Normal range of motion and neck supple.     Comments: Wound VAC to the left AC. Good perfusion distally.    Skin:    Coloration: Skin is not pale.     Findings: No rash.   Neurological:  Mental Status: [name] is alert and oriented to person, place, and time.   Psychiatric:        Behavior: Behavior normal.     (all labs ordered are listed, but only abnormal results are displayed) Labs Reviewed  CBC - Abnormal; Notable for the following components:      Result Value   RBC 3.73 (*)    Hemoglobin 9.1 (*)    HCT 30.5 (*)    MCH 24.4 (*)    MCHC 29.8 (*)    RDW 18.9 (*)    Platelets 141 (*)    All other components within normal limits  BASIC METABOLIC PANEL WITH GFR - Abnormal; Notable for the following components:   Glucose, Bld 196 (*)    Creatinine, Ser 4.12 (*)    Calcium  8.8 (*)    GFR, Estimated 17 (*)    All other components within normal limits    EKG: None  Radiology: No results found.   Procedures   Medications Ordered in the ED - No data to display                                  Medical Decision Making  53 yo M with a cc of bloody drainage from his left AV fistula.  Patient unfortunately had dehiscence of surgical wound to the fistula and required repeat surgery.  [name] went to dialysis today as scheduled and notified them that [name] had had some extra drainage and they had encouraged him to call the vascular surgery office who then encouraged him to come to the emergency department for evaluation.  Hemoglobin is relatively stable to last check.  No significant electrolyte abnormalities.  Will discuss with vascular.  Patient seen  by Dr. Pearline, no significant bleeding on his exam.  Recommends discharge home with dressing in place.  Vascular follow-up  3:18 PM:  I have discussed the diagnosis/risks/treatment options with the patient.  Evaluation and diagnostic testing in the emergency department does not suggest an emergent condition requiring admission or immediate intervention beyond what has been performed at this time.  They will follow up with Vascular. We also discussed returning to the ED immediately if new or worsening sx occur. We discussed the sx which are most concerning (e.g., sudden worsening pain, fever, inability to tolerate by mouth) that necessitate immediate return. Medications administered to the patient during their visit and any new prescriptions provided to the patient are listed below.  Medications given during this visit Medications - No data to display   The patient appears reasonably screen and/or stabilized for discharge and I doubt any other medical condition or other [facility] requiring further screening, evaluation, or treatment in the ED at this time prior to discharge.       Final diagnoses:  Visit for wound check    ED Discharge Orders     None          Emil Share, DO 05/20/24 1518

## 2024-05-20 NOTE — ED Provider Triage Note (Signed)
 Emergency Medicine Provider Triage Evaluation Note  [name] [name] , a 53 y.o. male  was evaluated in triage.  Pt complains of post op problem. States vascular just repaired his left arm fistula on 6/25. Went to dialysis this morning and had a full session. They were concerned about the amount of drainage in the wound vac and recommended he come here for evaluation.  Review of Systems  Positive:  Negative:   Physical Exam  BP (!) 203/103 (BP Location: Right Arm)   Pulse 95   Temp 98.5 F (36.9 C) (Oral)   Resp 16   Ht 6' 5 (1.956 m)   Wt 108.9 kg   SpO2 98%   BMI 28.46 kg/m  Gen:   Awake, no distress   Resp:  Normal effort  MSK:   Moves extremities without difficulty  Other:    Medical Decision Making  Medically screening exam initiated at 12:22 PM.  Appropriate orders placed.  [name] [name] was informed that the remainder of the evaluation will be completed by another provider, this initial triage assessment does not replace that evaluation, and the importance of remaining in the ED until their evaluation is complete.     Nora Lauraine LABOR, PA-C 05/20/24 1225

## 2024-05-21 DIAGNOSIS — R262 Difficulty in walking, not elsewhere classified: Secondary | ICD-10-CM | POA: Diagnosis not present

## 2024-05-21 DIAGNOSIS — M25561 Pain in right knee: Secondary | ICD-10-CM | POA: Diagnosis not present

## 2024-05-22 ENCOUNTER — Encounter (HOSPITAL_COMMUNITY): Payer: Self-pay

## 2024-05-22 ENCOUNTER — Ambulatory Visit: Attending: Surgery | Admitting: Physician Assistant

## 2024-05-22 ENCOUNTER — Encounter: Payer: Self-pay | Admitting: Physician Assistant

## 2024-05-22 DIAGNOSIS — N186 End stage renal disease: Secondary | ICD-10-CM | POA: Diagnosis not present

## 2024-05-22 DIAGNOSIS — Z992 Dependence on renal dialysis: Secondary | ICD-10-CM | POA: Diagnosis not present

## 2024-05-22 DIAGNOSIS — I129 Hypertensive chronic kidney disease with stage 1 through stage 4 chronic kidney disease, or unspecified chronic kidney disease: Secondary | ICD-10-CM | POA: Diagnosis not present

## 2024-05-22 LAB — AEROBIC/ANAEROBIC CULTURE W GRAM STAIN (SURGICAL/DEEP WOUND): Gram Stain: NONE SEEN

## 2024-05-22 NOTE — Progress Notes (Signed)
 POST OPERATIVE OFFICE NOTE    CC:  F/u for surgery  HPI:  This is a 53 y.o. male who is s/p left arm second stage basilic vein fistula creation by Dr. Magda on 04/26/2024.  He developed a seroma from his distalmost incision postoperatively and was brought back to the operating room on 05/17/2024 by Dr. Serene for irrigation and debridement of seroma with Prevena VAC.  Prevena VAC was removed in the emergency department this past weekend due to filling with bloody drainage.  He returns today for evaluation of incision.  He does not experience any more bloody drainage however has copious serous fluid drainage.  This is soaking through dry dressings every few hours.  He is dialyzing via T[facility].  He is unwilling to quit smoking.  Allergies  Allergen Reactions   Iron  Nausea And Vomiting    With the iron  tablets   Tape Rash    Rash at site of tape--okay to use paper tape    Current Outpatient Medications  Medication Sig Dispense Refill   Accu-Chek Softclix Lancets lancets Use to check blood sugar twice per day. E11.9 100 each 12   acetaminophen  (TYLENOL ) 500 MG tablet Take 2 tablets (1,000 mg total) by mouth every 6 (six) hours as needed. 30 tablet 0   albuterol  (VENTOLIN  HFA) 108 (90 Base) MCG/ACT inhaler Inhale 2 puffs into the lungs every 6 (six) hours as needed for wheezing or shortness of breath. 8 g 2   atorvastatin  (LIPITOR) 40 MG tablet TAKE 1 TABLET(40 MG) BY MOUTH AT BEDTIME 90 tablet 0   Blood Glucose Monitoring Suppl (ACCU-CHEK GUIDE ME) w/Device KIT See admin instructions.     calcitRIOL  (ROCALTROL ) 0.25 MCG capsule Take 1 capsule (0.25 mcg total) by mouth Every Tuesday,Thursday,and Saturday with dialysis. 30 capsule 0   Calcium  Acetate 667 MG TABS Take 1,334 mg by mouth 3 (three) times daily.     diltiazem  (CARDIZEM  CD) 300 MG 24 hr capsule TAKE 1 CAPSULE(300 MG) BY MOUTH DAILY 90 capsule 0   FLUoxetine  (PROZAC ) 20 MG capsule TAKE 1 CAPSULE(20 MG) BY MOUTH DAILY 90 capsule 2    fluticasone  (FLONASE ) 50 MCG/ACT nasal spray Place 2 sprays into both nostrils daily. 16 g 6   Fluticasone -Umeclidin-Vilant (TRELEGY ELLIPTA ) 100-62.5-25 MCG/ACT AEPB INHALE 1 PUFF INTO LUNGS DAILY 60 each 3   gabapentin  (NEURONTIN ) 300 MG capsule TAKE 1 CAPSULE(300 MG) BY MOUTH DAILY 30 capsule 0   glucose blood (AGAMATRIX PRESTO TEST) test strip Check blood sugars twice daily before meals 100 each 12   glucose blood test strip 1 each by Other route as needed for other. Use as instructed     hydrALAZINE  (APRESOLINE ) 50 MG tablet Take 1 tablet (50 mg total) by mouth 3 (three) times daily. 90 tablet 0   HYDROcodone -acetaminophen  (NORCO/VICODIN) 5-325 MG tablet Take 1 tablet by mouth every 6 (six) hours as needed for severe pain (pain score 7-10). 10 tablet 0   insulin  lispro (HUMALOG ) 100 UNIT/ML injection 0-6 Units, Subcutaneous, 3 times daily with meals, First dose on Sun 10/17/23 at 0800 Correction coverage: Very Sensitive (ESRD/Dialysis) CBG < 70: Implement Hypoglycemia Standing Orders and refer to Hypoglycemia Standing Orders sidebar report CBG 70 - 120: 0 units CBG 121 - 150: 0 units CBG 151 - 200: 1 unit CBG 201-250: 2 units CBG 251-300: 3 units CBG 301-350: 4 units CBG 351-400: 5 units CBG > 400: Give 6 units and call MD (Patient taking differently: Inject into the skin 2 (two) times  daily. 0-6 Units, Subcutaneous, 3 times daily with meals, as needed  Correction coverage: Very Sensitive (ESRD/Dialysis) CBG < 70: Implement Hypoglycemia Standing Orders and refer to Hypoglycemia Standing Orders sidebar report CBG 70 - 120: 0 units CBG 121 - 150: 0 units CBG 151 - 200: 1 unit CBG 201-250: 2 units CBG 251-300: 3 units CBG 301-350: 4 units CBG 351-400: 5 units CBG > 400: Give 6 units and call MD)     lidocaine  (HM LIDOCAINE  PATCH) 4 % Place 1 patch onto the skin daily. 30 patch 0   losartan  (COZAAR ) 25 MG tablet TAKE 1 TABLET(25 MG) BY MOUTH DAILY 90 tablet 0   metoprolol  succinate  (TOPROL -XL) 25 MG 24 hr tablet Take 25 mg by mouth daily.     ondansetron  (ZOFRAN -ODT) 4 MG disintegrating tablet Take 1 tablet (4 mg total) by mouth every 8 (eight) hours as needed for nausea or vomiting. 20 tablet 0   pantoprazole  (PROTONIX ) 40 MG tablet TAKE 1 TABLET(40 MG) BY MOUTH DAILY 90 tablet 0   polyethylene glycol (MIRALAX  / GLYCOLAX ) 17 g packet Take 17 g by mouth daily. 30 each 2   sevelamer  carbonate (RENVELA ) 800 MG tablet Take 2 tablets (1,600 mg total) by mouth 3 (three) times daily with meals. 180 tablet 0   tamsulosin  (FLOMAX ) 0.4 MG CAPS capsule TAKE 1 CAPSULE(0.4 MG) BY MOUTH DAILY AFTER SUPPER 90 capsule 0   tiZANidine  (ZANAFLEX ) 2 MG tablet TAKE 1 TABLET(2 MG) BY MOUTH TWICE DAILY AS NEEDED FOR MUSCLE SPASMS 60 tablet 0   traMADol (ULTRAM) 50 MG tablet Take 50 mg by mouth 3 (three) times daily as needed.     No current facility-administered medications for this visit.     ROS:  See HPI  Physical Exam:  There were no vitals filed for this visit.  Incision:  L arm incision well-appearing.  Serous fluid oozing from incision.  Wound measures 6 cm long by 0.5 cm wide by 2 cm deep Extremities: Palpable thrill throughout the basilic vein fistula; palpable left radial pulse Neuro: A&O  Assessment/Plan:  This is a 53 y.o. male who is s/p: Left arm second stage basilic vein transposition followed by I&D of seroma  He continues to have serous fluid drainage from the incision in the distal upper arm.  Fortunately there is not reaccumulation of a seroma cavity.  The wound measures 6 cm long by 0.5 cm wide by 2 cm deep.  I have contacted the peel in place wound VAC representative who will work on obtaining a home pump and supplies.  Our office will contact the patient once this has been approved.  He will return to the office for peel and place wound VAC placement by RN or provider.  He will follow-up on a weekly basis for peel and place dressing change until drainage resolves.  He  unfortunately cannot have a home health nurse to perform the above.  I have asked Mr. Stan to stop smoking until he has healed his wound however he is unwilling.   Donnice Sender, PA-C [facility] [phone]  Clinic MD:  Serene

## 2024-05-23 ENCOUNTER — Encounter

## 2024-05-23 DIAGNOSIS — Z992 Dependence on renal dialysis: Secondary | ICD-10-CM | POA: Diagnosis not present

## 2024-05-23 DIAGNOSIS — N186 End stage renal disease: Secondary | ICD-10-CM | POA: Diagnosis not present

## 2024-05-23 DIAGNOSIS — D631 Anemia in chronic kidney disease: Secondary | ICD-10-CM | POA: Diagnosis not present

## 2024-05-23 DIAGNOSIS — I132 Hypertensive heart and chronic kidney disease with heart failure and with stage 5 chronic kidney disease, or end stage renal disease: Secondary | ICD-10-CM | POA: Diagnosis not present

## 2024-05-23 DIAGNOSIS — D689 Coagulation defect, unspecified: Secondary | ICD-10-CM | POA: Diagnosis not present

## 2024-05-23 DIAGNOSIS — T8249XA Other complication of vascular dialysis catheter, initial encounter: Secondary | ICD-10-CM | POA: Diagnosis not present

## 2024-05-23 DIAGNOSIS — E1122 Type 2 diabetes mellitus with diabetic chronic kidney disease: Secondary | ICD-10-CM | POA: Diagnosis not present

## 2024-05-23 DIAGNOSIS — N2581 Secondary hyperparathyroidism of renal origin: Secondary | ICD-10-CM | POA: Diagnosis not present

## 2024-05-24 ENCOUNTER — Encounter: Payer: Self-pay | Admitting: Student in an Organized Health Care Education/Training Program

## 2024-05-24 ENCOUNTER — Ambulatory Visit (INDEPENDENT_AMBULATORY_CARE_PROVIDER_SITE_OTHER): Admitting: Student in an Organized Health Care Education/Training Program

## 2024-05-24 ENCOUNTER — Ambulatory Visit: Attending: Vascular Surgery | Admitting: Physician Assistant

## 2024-05-24 VITALS — BP 168/78 | HR 96 | Temp 97.1°F | Ht 77.0 in | Wt 249.4 lb

## 2024-05-24 DIAGNOSIS — K2101 Gastro-esophageal reflux disease with esophagitis, with bleeding: Secondary | ICD-10-CM

## 2024-05-24 DIAGNOSIS — N186 End stage renal disease: Secondary | ICD-10-CM

## 2024-05-24 DIAGNOSIS — Z87891 Personal history of nicotine dependence: Secondary | ICD-10-CM | POA: Diagnosis not present

## 2024-05-24 DIAGNOSIS — T8189XA Other complications of procedures, not elsewhere classified, initial encounter: Secondary | ICD-10-CM | POA: Diagnosis not present

## 2024-05-24 DIAGNOSIS — R053 Chronic cough: Secondary | ICD-10-CM

## 2024-05-24 DIAGNOSIS — S41102A Unspecified open wound of left upper arm, initial encounter: Secondary | ICD-10-CM | POA: Diagnosis not present

## 2024-05-24 MED ORDER — LORATADINE 10 MG PO TABS: 10.0000 mg | ORAL_TABLET | Freq: Every day | ORAL | 11 refills | Status: DC

## 2024-05-24 NOTE — Progress Notes (Signed)
    Mr. [name] [name] is a 53 year old male status post second stage basilic vein fistula creation.  He developed a seroma postoperatively and was brought back to the operating room on 05/17/2024 for irrigation and debridement with incisional VAC placement.  This was removed after a week however he continued to drain serous fluid from his incision.  A peel in place wound VAC with home pump was arranged.  He returns to clinic today for application of peeling place wound VAC.  A good seal was obtained.  He will follow-up for weekly VAC changes until serous drainage resolves.  Donnice Sender, PA-C [facility] [phone] 05/24/2024  4:18 PM

## 2024-05-24 NOTE — Progress Notes (Signed)
 Assessment & Plan:   #Chronic cough #Shortness of Breath #GERD  He is presenting for the evaluation of cough that has been chronic and has not improved with trials of inhaler therapy.   He did have an upper respiratory infection around the time of symptom onset but his CT chest does show bilateral pleural effusions as well as a possible right middle lobe infiltrate.  The cough is worse when he lays flat, and he is describing a diet that is highly triggering for reflux disease. PFT's showed a normal FEV1/FVC ratio, but with a mild obstructive defect on review of FEV1. There is a mild restrictive defect noted on lung volumes and DLCO that is explained by his ESRD and effusions. Repeat chest CT in May of 2025 only showed small left and trace right pleural effusions that were improved from prior.  Previously seen RML infiltrate was resolved. Echocardiogram was also re-assuring.  The differential for his symptoms includes a postviral cough syndrome which triggered worsening of reflux disease, pulmonary edema, and obstructive lung disease (such as COPD). He has not benefited from a trial of bronchodilators making COPD less likely. I am most suspicious of reflux related cough at this point.  For management, I have counseled Mr. Moorhouse on maintaining a GERD consistent diet, and we went over the different foods that he regularly consumes (especially mountain dew, fries, hot dogs). I have also provided him with an informational packet regarding GERD and dietary modifications. We will also trial a second generation anti-histamine to rule out any UACS.   - loratadine  (CLARITIN ) 10 MG tablet; Take 1 tablet (10 mg total) by mouth daily.  Dispense: 30 tablet; Refill: 11 - GERD consistent diet - salt avoidance stressed given ESRD  Return in about 3 months (around 08/24/2024).  I spent 34 minutes caring for this patient today, including preparing to see the patient, obtaining a medical history , reviewing  a separately obtained history, performing a medically appropriate examination and/or evaluation, counseling and educating the patient/family/caregiver, ordering medications, tests, or procedures, documenting clinical information in the electronic health record, and independently interpreting results (not separately reported/billed) and communicating results to the patient/family/caregiver  [name], MD [facility]  End of visit medications:  Meds ordered this encounter  Medications   loratadine  (CLARITIN ) 10 MG tablet    Sig: Take 1 tablet (10 mg total) by mouth daily.    Dispense:  30 tablet    Refill:  11     Current Outpatient Medications:    Accu-Chek Softclix Lancets lancets, Use to check blood sugar twice per day. E11.9, Disp: 100 each, Rfl: 12   albuterol  (VENTOLIN  HFA) 108 (90 Base) MCG/ACT inhaler, Inhale 2 puffs into the lungs every 6 (six) hours as needed for wheezing or shortness of breath., Disp: 8 g, Rfl: 2   atorvastatin  (LIPITOR) 40 MG tablet, TAKE 1 TABLET(40 MG) BY MOUTH AT BEDTIME, Disp: 90 tablet, Rfl: 0   Blood Glucose Monitoring Suppl (ACCU-CHEK GUIDE ME) w/Device KIT, See admin instructions., Disp: , Rfl:    calcitRIOL  (ROCALTROL ) 0.25 MCG capsule, Take 1 capsule (0.25 mcg total) by mouth Every Tuesday,Thursday,and Saturday with dialysis., Disp: 30 capsule, Rfl: 0   Calcium  Acetate 667 MG TABS, Take 1,334 mg by mouth 3 (three) times daily., Disp: , Rfl:    diltiazem  (CARDIZEM  CD) 300 MG 24 hr capsule, TAKE 1 CAPSULE(300 MG) BY MOUTH DAILY, Disp: 90 capsule, Rfl: 0   FLUoxetine  (PROZAC ) 20 MG capsule, TAKE 1 CAPSULE(20  MG) BY MOUTH DAILY, Disp: 90 capsule, Rfl: 2   fluticasone  (FLONASE ) 50 MCG/ACT nasal spray, Place 2 sprays into both nostrils daily., Disp: 16 g, Rfl: 6   Fluticasone -Umeclidin-Vilant (TRELEGY ELLIPTA ) 100-62.5-25 MCG/ACT AEPB, INHALE 1 PUFF INTO LUNGS DAILY, Disp: 60 each, Rfl: 3   gabapentin  (NEURONTIN ) 300 MG capsule, TAKE 1  CAPSULE(300 MG) BY MOUTH DAILY, Disp: 30 capsule, Rfl: 0   glucose blood (AGAMATRIX PRESTO TEST) test strip, Check blood sugars twice daily before meals, Disp: 100 each, Rfl: 12   glucose blood test strip, 1 each by Other route as needed for other. Use as instructed, Disp: , Rfl:    hydrALAZINE  (APRESOLINE ) 50 MG tablet, Take 1 tablet (50 mg total) by mouth 3 (three) times daily., Disp: 90 tablet, Rfl: 0   HYDROcodone -acetaminophen  (NORCO/VICODIN) 5-325 MG tablet, Take 1 tablet by mouth every 6 (six) hours as needed for severe pain (pain score 7-10)., Disp: 10 tablet, Rfl: 0   insulin  lispro (HUMALOG ) 100 UNIT/ML injection, 0-6 Units, Subcutaneous, 3 times daily with meals, First dose on Sun 10/17/23 at 0800 Correction coverage: Very Sensitive (ESRD/Dialysis) CBG < 70: Implement Hypoglycemia Standing Orders and refer to Hypoglycemia Standing Orders sidebar report CBG 70 - 120: 0 units CBG 121 - 150: 0 units CBG 151 - 200: 1 unit CBG 201-250: 2 units CBG 251-300: 3 units CBG 301-350: 4 units CBG 351-400: 5 units CBG > 400: Give 6 units and call MD (Patient taking differently: Inject into the skin 2 (two) times daily. 0-6 Units, Subcutaneous, 3 times daily with meals, as needed  Correction coverage: Very Sensitive (ESRD/Dialysis) CBG < 70: Implement Hypoglycemia Standing Orders and refer to Hypoglycemia Standing Orders sidebar report CBG 70 - 120: 0 units CBG 121 - 150: 0 units CBG 151 - 200: 1 unit CBG 201-250: 2 units CBG 251-300: 3 units CBG 301-350: 4 units CBG 351-400: 5 units CBG > 400: Give 6 units and call MD), Disp: , Rfl:    lidocaine  (HM LIDOCAINE  PATCH) 4 %, Place 1 patch onto the skin daily., Disp: 30 patch, Rfl: 0   loratadine  (CLARITIN ) 10 MG tablet, Take 1 tablet (10 mg total) by mouth daily., Disp: 30 tablet, Rfl: 11   losartan  (COZAAR ) 25 MG tablet, TAKE 1 TABLET(25 MG) BY MOUTH DAILY, Disp: 90 tablet, Rfl: 0   metoprolol  succinate (TOPROL -XL) 25 MG 24 hr tablet, Take 25 mg by mouth daily.,  Disp: , Rfl:    ondansetron  (ZOFRAN -ODT) 4 MG disintegrating tablet, Take 1 tablet (4 mg total) by mouth every 8 (eight) hours as needed for nausea or vomiting., Disp: 20 tablet, Rfl: 0   pantoprazole  (PROTONIX ) 40 MG tablet, TAKE 1 TABLET(40 MG) BY MOUTH DAILY, Disp: 90 tablet, Rfl: 0   tiZANidine  (ZANAFLEX ) 2 MG tablet, TAKE 1 TABLET(2 MG) BY MOUTH TWICE DAILY AS NEEDED FOR MUSCLE SPASMS, Disp: 60 tablet, Rfl: 0   traMADol (ULTRAM) 50 MG tablet, Take 50 mg by mouth 3 (three) times daily as needed., Disp: , Rfl:    acetaminophen  (TYLENOL ) 500 MG tablet, Take 2 tablets (1,000 mg total) by mouth every 6 (six) hours as needed. (Patient not taking: Reported on 05/24/2024), Disp: 30 tablet, Rfl: 0   polyethylene glycol (MIRALAX  / GLYCOLAX ) 17 g packet, Take 17 g by mouth daily. (Patient not taking: Reported on 05/24/2024), Disp: 30 each, Rfl: 2   sevelamer  carbonate (RENVELA ) 800 MG tablet, Take 2 tablets (1,600 mg total) by mouth 3 (three) times daily with meals. (Patient not  taking: Reported on 05/24/2024), Disp: 180 tablet, Rfl: 0   tamsulosin  (FLOMAX ) 0.4 MG CAPS capsule, TAKE 1 CAPSULE(0.4 MG) BY MOUTH DAILY AFTER SUPPER (Patient not taking: Reported on 05/24/2024), Disp: 90 capsule, Rfl: 0   Subjective:   PATIENT ID: [name] [name] Marc GENDER: male DOB: [birth year], MRN: [id number]  Chief Complaint  Patient presents with   Follow-up    SOB. Wheezing at night. Cough in the morning.     HPI  Patient is a pleasant 53 year old male with a past medical history of end-stage renal disease on hemodialysis who presents for follow up.   Initial Visit 04/12/2024: Patient's symptoms started late in April following an upper respiratory tract infection.  He had been seen in early May at a local ED given persistence of symptoms where a CT scan of the chest was performed and showed bilateral pleural effusions.  He has been seen by his primary care physician and prescribed antibiotics as well as steroids without much  improvement.  He has received 2 courses of prednisone  as well as a course of Augmentin  without much improvement.  He was previously on Anoro Ellipta  that was switched to Trelegy again without much difference in symptoms. Symptoms are new and he has not had symptoms like this in the past. Symptom onset was late in April.  He currently reports a cough that is productive of yellowish sputum with associated exertional dyspnea.  He occasionally notices a wheeze.  He does not have any chest pain or chest tightness, has not had any fevers recently, nor has he had any night sweats.  He does report increased lower extremity edema but does not associate this with the symptoms.  He is compliant with dialysis and receives it 3 times a week.  He does not have any signs of aspiration with food intake.   Return Visit 05/24/2024: He reports persistent symptoms and is mostly bothered by his cough. He feels that the cough is worse when he lays flat. Discussed diet, and he reports significant intake of fast foods and soft drinks. He had his PFT's and did not feel any improvement in symptoms following administration of albuterol . He has tried Trelegy but this did not affect his symptoms. He continues to get his regularly scheduled dialysis.  Patient reports history of smoking around half pack a day for the past 35 years.  He continues to smoke.  He is currently on disability but previously worked multiple jobs including driving a truck, working at [employer], and working as a delivery person.   Ancillary information including prior medications, full medical/surgical/family/social histories, and PFTs (when available) are listed below and have been reviewed.   Review of Systems  Constitutional:  Negative for chills, fever and weight loss.  Respiratory:  Positive for cough and sputum production. Negative for hemoptysis and shortness of breath.   Cardiovascular:  Negative for chest pain.     Objective:   Vitals:   05/24/24  1628  BP: (!) 168/78  Pulse: 96  Temp: (!) 97.1 F (36.2 C)  SpO2: 96%  Weight: 249 lb 6.4 oz (113.1 kg)  Height: 6' 5 (1.956 m)   96% on RA  BMI Readings from Last 3 Encounters:  05/24/24 29.57 kg/m  05/20/24 28.46 kg/m  05/17/24 29.05 kg/m   Wt Readings from Last 3 Encounters:  05/24/24 249 lb 6.4 oz (113.1 kg)  05/20/24 240 lb (108.9 kg)  05/17/24 245 lb (111.1 kg)    Physical Exam Constitutional:  Appearance: Normal appearance.  Cardiovascular:     Rate and Rhythm: Normal rate and regular rhythm.     Pulses: Normal pulses.     Heart sounds: Normal heart sounds.  Pulmonary:     Effort: Pulmonary effort is normal.     Breath sounds: Normal breath sounds.  Abdominal:     Palpations: Abdomen is soft.  Musculoskeletal:     Left lower leg: Edema present.     Comments: Right BKA  Neurological:     General: No focal deficit present.     Mental Status: He is alert and oriented to person, place, and time. Mental status is at baseline.       Ancillary Information    Past Medical History:  Diagnosis Date   Abdominal pain 11/30/2023   Abdominal tenderness 10/14/2023   Allergy    seasonal and environmental   Anemia    hx blood transfusion 04/27/24   Chest pain 12/01/2023   Chronic constipation 05/13/2020   Depression    Diabetes mellitus without complication (HCC) 2019   Dyspnea    hx - SOB with exertion   ESRD on hemodialysis (HCC)    Tues, Thurs, Sat   GERD (gastroesophageal reflux disease)    Hyperlipidemia    Hypertension    Necrotizing fasciitis (HCC) 10/13/2017   Neuromuscular disorder (HCC)    neuropathy   PAD (peripheral artery disease) (HCC)    Secondary hyperparathyroidism (HCC)    SVT (supraventricular tachycardia) (HCC)    hx   Wound dehiscence 11/24/2017     Family History  Problem Relation Age of Onset   Diabetes Mellitus II Mother    Depression Mother    Colon polyps Mother    Hypertension Mother    High Cholesterol Mother     Heart disease Father        CABG x 4.  04/2017   Diabetes Father    High Cholesterol Father    Hypertension Father    Heart disease Maternal Grandmother    Heart disease Maternal Grandfather    Colon cancer Neg Hx    Esophageal cancer Neg Hx    Liver cancer Neg Hx    Pancreatic cancer Neg Hx    Rectal cancer Neg Hx    Stomach cancer Neg Hx      Past Surgical History:  Procedure Laterality Date   A/V FISTULAGRAM N/A 03/17/2024   Procedure: A/V Fistulagram;  Surgeon: Magda Debby SAILOR, MD;  Location: [facility];  Service: Cardiovascular;  Laterality: N/A;   AMPUTATION Right 10/13/2017   Procedure: RIGHT BELOW KNEE AMPUTATION;  Surgeon: Harden Jerona GAILS, MD;  Location: [facility] OR;  Service: Orthopedics;  Laterality: Right;   AMPUTATION Right 11/24/2017   Procedure: AMPUTATION BELOW KNEE REVISION;  Surgeon: Harden Jerona GAILS, MD;  Location: [facility] OR;  Service: Orthopedics;  Laterality: Right;   AMPUTATION TOE Left    AV FISTULA PLACEMENT Left 10/19/2023   Procedure: LEFT BRACHIOCEPHALIC ARTERIOVENOUS (AV) FISTULA CREATION;  Surgeon: Pearline Norman RAMAN, MD;  Location: [facility] OR;  Service: Vascular;  Laterality: Left;   AV FISTULA PLACEMENT Left 01/26/2024   Procedure: LEFT ARTERIOVENOUS (AV) FISTULA CREATION;  Surgeon: Pearline Norman RAMAN, MD;  Location: [facility] OR;  Service: Vascular;  Laterality: Left;   BASCILIC VEIN TRANSPOSITION Left 04/26/2024   Procedure: LEFT SECOND STAGE TRANSPOSITION, VEIN, BASILIC;  Surgeon: Magda Debby SAILOR, MD;  Location: [facility] OR;  Service: Vascular;  Laterality: Left;   BIOPSY  09/02/2021   Procedure: BIOPSY;  Surgeon: Federico Rosario BROCKS, MD;  Location: [facility] ENDOSCOPY;  Service: Gastroenterology;;   BIOPSY  02/18/2023   Procedure: BIOPSY;  Surgeon: Leigh Elspeth SQUIBB, MD;  Location: [facility] ENDOSCOPY;  Service: Gastroenterology;;   COLONOSCOPY  05/11/2019   COLONOSCOPY  2021   3 polyps - tubular adenomas   ESOPHAGOGASTRODUODENOSCOPY (EGD) WITH PROPOFOL  N/A 09/02/2021   Procedure:  ESOPHAGOGASTRODUODENOSCOPY (EGD) WITH PROPOFOL ;  Surgeon: Federico Rosario BROCKS, MD;  Location: [facility] ENDOSCOPY;  Service: Gastroenterology;  Laterality: N/A;   ESOPHAGOGASTRODUODENOSCOPY (EGD) WITH PROPOFOL  N/A 02/18/2023   Procedure: ESOPHAGOGASTRODUODENOSCOPY (EGD) WITH PROPOFOL ;  Surgeon: Leigh Elspeth SQUIBB, MD;  Location: [facility] ENDOSCOPY;  Service: Gastroenterology;  Laterality: N/A;   INCISION AND DRAINAGE OF WOUND Left 05/17/2024   Procedure: IRRIGATION AND DEBRIDEMENT WOUND;  Surgeon: Serene Gaile ORN, MD;  Location: [facility] OR;  Service: Vascular;  Laterality: Left;  Arm   INTRAMEDULLARY (IM) NAIL INTERTROCHANTERIC Right 10/15/2023   Procedure: INTRAMEDULLARY (IM) NAIL INTERTROCHANTERIC;  Surgeon: Kendal Franky SQUIBB, MD;  Location: [facility] OR;  Service: Orthopedics;  Laterality: Right;   IR FLUORO GUIDE CV LINE RIGHT  10/15/2023   IR US  GUIDE VASC ACCESS RIGHT  10/15/2023   POLYPECTOMY     WISDOM TOOTH EXTRACTION      Social History   Socioeconomic History   Marital status: Single    Spouse name: Not on file   Number of children: 1   Years of education: 12   Highest education level: Associate degree: occupational, Scientist, product/process development, or vocational program  Occupational History   Occupation: unemployed  Tobacco Use   Smoking status: Every Day    Current packs/day: 0.50    Average packs/day: 0.5 packs/day for 36.0 years (17.9 ttl pk-yrs)    Types: Cigarettes    Start date: 11/23/1988   Smokeless tobacco: Never   Tobacco comments:    6 cigarettes daily- khj 05/24/2024        Started smoking at 53 years old    Smoked 1PPD at his heaviest  Vaping Use   Vaping status: Never Used  Substance and Sexual Activity   Alcohol use: No   Drug use: No   Sexual activity: Not Currently  Other Topics Concern   Not on file  Social History Narrative   Originally from  [place].   Is living with his mother, who essentially supports him now.   Social Drivers of Health   Financial Resource Strain: High Risk  (10/30/2023)   Overall Financial Resource Strain (CARDIA)    Difficulty of Paying Living Expenses: Very hard  Food Insecurity: Patient Declined (01/23/2024)   Hunger Vital Sign    Worried About Running Out of Food in the Last Year: Patient declined    Ran Out of Food in the Last Year: Patient declined  Recent Concern: Food Insecurity - Food Insecurity Present (12/01/2023)   Hunger Vital Sign    Worried About Running Out of Food in the Last Year: Never true    Ran Out of Food in the Last Year: Sometimes true  Transportation Needs: Patient Declined (01/23/2024)   PRAPARE - Administrator, Civil Service (Medical): Patient declined    Lack of Transportation (Non-Medical): Patient declined  Physical Activity: Unknown (10/30/2023)   Exercise Vital Sign    Days of Exercise per Week: Patient declined    Minutes of Exercise per Session: 0 min  Stress: Stress Concern Present (10/30/2023)   [employer] of Occupational Health - Occupational Stress Questionnaire    Feeling of Stress : To  some extent  Social Connections: Patient Declined (01/23/2024)   Social Connection and Isolation Panel    Frequency of Communication with Friends and Family: Patient declined    Frequency of Social Gatherings with Friends and Family: Patient declined    Attends Religious Services: Patient declined    Active Member of Clubs or Organizations: Patient declined    Attends Banker Meetings: Patient declined    Marital Status: Patient declined  Recent Concern: Social Connections - Socially Isolated (01/23/2024)   Social Connection and Isolation Panel    Frequency of Communication with Friends and Family: More than three times a week    Frequency of Social Gatherings with Friends and Family: Never    Attends Religious Services: Never    Database administrator or Organizations: No    Attends Banker Meetings: Never    Marital Status: Never married  Intimate Partner Violence: Not At Risk  (03/26/2024)   Received from [facility]   HITS    Over the last 12 months how often did your partner physically hurt you?: Never    Over the last 12 months how often did your partner insult you or talk down to you?: Never    Over the last 12 months how often did your partner threaten you with physical harm?: Never    Over the last 12 months how often did your partner scream or curse at you?: Never     Allergies  Allergen Reactions   Iron  Nausea And Vomiting    With the iron  tablets   Tape Rash    Rash at site of tape--okay to use paper tape     CBC    Component Value Date/Time   WBC 8.9 05/20/2024 1231   RBC 3.73 (L) 05/20/2024 1231   HGB 9.1 (L) 05/20/2024 1231   HGB 9.7 (L) 02/16/2024 1215   HCT 30.5 (L) 05/20/2024 1231   HCT 31.1 (L) 02/16/2024 1215   PLT 141 (L) 05/20/2024 1231   PLT 118 (L) 02/16/2024 1215   MCV 81.8 05/20/2024 1231   MCV 87 02/16/2024 1215   MCH 24.4 (L) 05/20/2024 1231   MCHC 29.8 (L) 05/20/2024 1231   RDW 18.9 (H) 05/20/2024 1231   RDW 16.7 (H) 02/16/2024 1215   LYMPHSABS 0.9 01/22/2024 2003   LYMPHSABS 2.7 01/21/2018 1631   MONOABS 0.4 01/22/2024 2003   EOSABS 0.1 01/22/2024 2003   EOSABS 0.6 (H) 01/21/2018 1631   BASOSABS 0.0 01/22/2024 2003   BASOSABS 0.0 01/21/2018 1631    Pulmonary Functions Testing Results:    Latest Ref Rng & Units 04/19/2024    8:50 AM  PFT Results  FVC-Pre L 4.73   FVC-Predicted Pre % 75   FVC-Post L 4.05   FVC-Predicted Post % 64   Pre FEV1/FVC % % 76   Post FEV1/FCV % % 82   FEV1-Pre L 3.60   FEV1-Predicted Pre % 74   FEV1-Post L 3.32   DLCO uncorrected ml/min/mmHg 24.44   DLCO UNC% % 68   DLCO corrected ml/min/mmHg 28.78   DLCO COR %Predicted % 80   DLVA Predicted % 109   TLC L 6.47   TLC % Predicted % 77   RV % Predicted % 170     Outpatient Medications Prior to Visit  Medication Sig Dispense Refill   Accu-Chek Softclix Lancets lancets Use to check blood sugar twice per day. E11.9 100 each 12    albuterol  (VENTOLIN  HFA) 108 (90 Base) MCG/ACT  inhaler Inhale 2 puffs into the lungs every 6 (six) hours as needed for wheezing or shortness of breath. 8 g 2   atorvastatin  (LIPITOR) 40 MG tablet TAKE 1 TABLET(40 MG) BY MOUTH AT BEDTIME 90 tablet 0   Blood Glucose Monitoring Suppl (ACCU-CHEK GUIDE ME) w/Device KIT See admin instructions.     calcitRIOL  (ROCALTROL ) 0.25 MCG capsule Take 1 capsule (0.25 mcg total) by mouth Every Tuesday,Thursday,and Saturday with dialysis. 30 capsule 0   Calcium  Acetate 667 MG TABS Take 1,334 mg by mouth 3 (three) times daily.     diltiazem  (CARDIZEM  CD) 300 MG 24 hr capsule TAKE 1 CAPSULE(300 MG) BY MOUTH DAILY 90 capsule 0   FLUoxetine  (PROZAC ) 20 MG capsule TAKE 1 CAPSULE(20 MG) BY MOUTH DAILY 90 capsule 2   fluticasone  (FLONASE ) 50 MCG/ACT nasal spray Place 2 sprays into both nostrils daily. 16 g 6   Fluticasone -Umeclidin-Vilant (TRELEGY ELLIPTA ) 100-62.5-25 MCG/ACT AEPB INHALE 1 PUFF INTO LUNGS DAILY 60 each 3   gabapentin  (NEURONTIN ) 300 MG capsule TAKE 1 CAPSULE(300 MG) BY MOUTH DAILY 30 capsule 0   glucose blood (AGAMATRIX PRESTO TEST) test strip Check blood sugars twice daily before meals 100 each 12   glucose blood test strip 1 each by Other route as needed for other. Use as instructed     hydrALAZINE  (APRESOLINE ) 50 MG tablet Take 1 tablet (50 mg total) by mouth 3 (three) times daily. 90 tablet 0   HYDROcodone -acetaminophen  (NORCO/VICODIN) 5-325 MG tablet Take 1 tablet by mouth every 6 (six) hours as needed for severe pain (pain score 7-10). 10 tablet 0   insulin  lispro (HUMALOG ) 100 UNIT/ML injection 0-6 Units, Subcutaneous, 3 times daily with meals, First dose on Sun 10/17/23 at 0800 Correction coverage: Very Sensitive (ESRD/Dialysis) CBG < 70: Implement Hypoglycemia Standing Orders and refer to Hypoglycemia Standing Orders sidebar report CBG 70 - 120: 0 units CBG 121 - 150: 0 units CBG 151 - 200: 1 unit CBG 201-250: 2 units CBG 251-300: 3  units CBG 301-350: 4 units CBG 351-400: 5 units CBG > 400: Give 6 units and call MD (Patient taking differently: Inject into the skin 2 (two) times daily. 0-6 Units, Subcutaneous, 3 times daily with meals, as needed  Correction coverage: Very Sensitive (ESRD/Dialysis) CBG < 70: Implement Hypoglycemia Standing Orders and refer to Hypoglycemia Standing Orders sidebar report CBG 70 - 120: 0 units CBG 121 - 150: 0 units CBG 151 - 200: 1 unit CBG 201-250: 2 units CBG 251-300: 3 units CBG 301-350: 4 units CBG 351-400: 5 units CBG > 400: Give 6 units and call MD)     lidocaine  (HM LIDOCAINE  PATCH) 4 % Place 1 patch onto the skin daily. 30 patch 0   losartan  (COZAAR ) 25 MG tablet TAKE 1 TABLET(25 MG) BY MOUTH DAILY 90 tablet 0   metoprolol  succinate (TOPROL -XL) 25 MG 24 hr tablet Take 25 mg by mouth daily.     ondansetron  (ZOFRAN -ODT) 4 MG disintegrating tablet Take 1 tablet (4 mg total) by mouth every 8 (eight) hours as needed for nausea or vomiting. 20 tablet 0   pantoprazole  (PROTONIX ) 40 MG tablet TAKE 1 TABLET(40 MG) BY MOUTH DAILY 90 tablet 0   tiZANidine  (ZANAFLEX ) 2 MG tablet TAKE 1 TABLET(2 MG) BY MOUTH TWICE DAILY AS NEEDED FOR MUSCLE SPASMS 60 tablet 0   traMADol (ULTRAM) 50 MG tablet Take 50 mg by mouth 3 (three) times daily as needed.     acetaminophen  (TYLENOL ) 500 MG tablet Take 2 tablets (  1,000 mg total) by mouth every 6 (six) hours as needed. (Patient not taking: Reported on 05/24/2024) 30 tablet 0   polyethylene glycol (MIRALAX  / GLYCOLAX ) 17 g packet Take 17 g by mouth daily. (Patient not taking: Reported on 05/24/2024) 30 each 2   sevelamer  carbonate (RENVELA ) 800 MG tablet Take 2 tablets (1,600 mg total) by mouth 3 (three) times daily with meals. (Patient not taking: Reported on 05/24/2024) 180 tablet 0   tamsulosin  (FLOMAX ) 0.4 MG CAPS capsule TAKE 1 CAPSULE(0.4 MG) BY MOUTH DAILY AFTER SUPPER (Patient not taking: Reported on 05/24/2024) 90 capsule 0   No facility-administered  medications prior to visit.

## 2024-05-25 DIAGNOSIS — I10 Essential (primary) hypertension: Secondary | ICD-10-CM | POA: Diagnosis not present

## 2024-05-25 DIAGNOSIS — T8249XA Other complication of vascular dialysis catheter, initial encounter: Secondary | ICD-10-CM | POA: Diagnosis not present

## 2024-05-25 DIAGNOSIS — E1122 Type 2 diabetes mellitus with diabetic chronic kidney disease: Secondary | ICD-10-CM | POA: Diagnosis not present

## 2024-05-25 DIAGNOSIS — K612 Anorectal abscess: Secondary | ICD-10-CM | POA: Diagnosis not present

## 2024-05-25 DIAGNOSIS — I132 Hypertensive heart and chronic kidney disease with heart failure and with stage 5 chronic kidney disease, or end stage renal disease: Secondary | ICD-10-CM | POA: Diagnosis not present

## 2024-05-25 DIAGNOSIS — Z794 Long term (current) use of insulin: Secondary | ICD-10-CM | POA: Diagnosis not present

## 2024-05-25 DIAGNOSIS — E119 Type 2 diabetes mellitus without complications: Secondary | ICD-10-CM | POA: Diagnosis not present

## 2024-05-25 DIAGNOSIS — I2510 Atherosclerotic heart disease of native coronary artery without angina pectoris: Secondary | ICD-10-CM | POA: Diagnosis not present

## 2024-05-25 DIAGNOSIS — M25461 Effusion, right knee: Secondary | ICD-10-CM | POA: Diagnosis not present

## 2024-05-25 DIAGNOSIS — E118 Type 2 diabetes mellitus with unspecified complications: Secondary | ICD-10-CM | POA: Diagnosis not present

## 2024-05-25 DIAGNOSIS — E1151 Type 2 diabetes mellitus with diabetic peripheral angiopathy without gangrene: Secondary | ICD-10-CM | POA: Diagnosis not present

## 2024-05-25 DIAGNOSIS — N2581 Secondary hyperparathyroidism of renal origin: Secondary | ICD-10-CM | POA: Diagnosis not present

## 2024-05-25 DIAGNOSIS — N186 End stage renal disease: Secondary | ICD-10-CM | POA: Diagnosis not present

## 2024-05-25 DIAGNOSIS — R590 Localized enlarged lymph nodes: Secondary | ICD-10-CM | POA: Diagnosis not present

## 2024-05-25 DIAGNOSIS — Z91158 Patient's noncompliance with renal dialysis for other reason: Secondary | ICD-10-CM | POA: Diagnosis not present

## 2024-05-25 DIAGNOSIS — L02415 Cutaneous abscess of right lower limb: Secondary | ICD-10-CM | POA: Diagnosis not present

## 2024-05-25 DIAGNOSIS — I120 Hypertensive chronic kidney disease with stage 5 chronic kidney disease or end stage renal disease: Secondary | ICD-10-CM | POA: Diagnosis not present

## 2024-05-25 DIAGNOSIS — M799 Soft tissue disorder, unspecified: Secondary | ICD-10-CM | POA: Diagnosis not present

## 2024-05-25 DIAGNOSIS — K610 Anal abscess: Secondary | ICD-10-CM | POA: Diagnosis not present

## 2024-05-25 DIAGNOSIS — Z992 Dependence on renal dialysis: Secondary | ICD-10-CM | POA: Diagnosis not present

## 2024-05-25 DIAGNOSIS — E875 Hyperkalemia: Secondary | ICD-10-CM | POA: Diagnosis not present

## 2024-05-25 DIAGNOSIS — D649 Anemia, unspecified: Secondary | ICD-10-CM | POA: Diagnosis not present

## 2024-05-25 DIAGNOSIS — Z89511 Acquired absence of right leg below knee: Secondary | ICD-10-CM | POA: Diagnosis not present

## 2024-05-25 DIAGNOSIS — F17210 Nicotine dependence, cigarettes, uncomplicated: Secondary | ICD-10-CM | POA: Diagnosis not present

## 2024-05-25 DIAGNOSIS — D631 Anemia in chronic kidney disease: Secondary | ICD-10-CM | POA: Diagnosis not present

## 2024-05-25 DIAGNOSIS — D689 Coagulation defect, unspecified: Secondary | ICD-10-CM | POA: Diagnosis not present

## 2024-05-25 DIAGNOSIS — L0231 Cutaneous abscess of buttock: Secondary | ICD-10-CM | POA: Diagnosis not present

## 2024-05-31 ENCOUNTER — Telehealth: Payer: Self-pay

## 2024-05-31 NOTE — Transitions of Care (Post Inpatient/ED Visit) (Signed)
   05/31/2024  Name: BROUGHTON EPPINGER MRN: [id number] DOB: Mar 20, 1971  Today's TOC FU Call Status: Today's TOC FU Call Status:: Unsuccessful Call (1st Attempt) Unsuccessful Call (1st Attempt) Date: 05/31/24  Attempted to reach the patient regarding the most recent Inpatient/ED visit.  Follow Up Plan: Additional outreach attempts will be made to reach the patient to complete the Transitions of Care (Post Inpatient/ED visit) call.    Bing Edison MSN, RN RN Case Sales executive Health  [facility] Office Hours M-F [phone] Direct Dial : [phone] Main Phone [phone]  Fax: [phone] [email or web address]

## 2024-06-01 ENCOUNTER — Ambulatory Visit

## 2024-06-01 ENCOUNTER — Other Ambulatory Visit: Payer: Self-pay

## 2024-06-01 ENCOUNTER — Telehealth: Payer: Self-pay

## 2024-06-01 VITALS — BP 177/81 | HR 87 | Ht 77.0 in | Wt 235.8 lb

## 2024-06-01 DIAGNOSIS — Z992 Dependence on renal dialysis: Secondary | ICD-10-CM | POA: Diagnosis not present

## 2024-06-01 DIAGNOSIS — N186 End stage renal disease: Secondary | ICD-10-CM | POA: Diagnosis not present

## 2024-06-01 DIAGNOSIS — I132 Hypertensive heart and chronic kidney disease with heart failure and with stage 5 chronic kidney disease, or end stage renal disease: Secondary | ICD-10-CM | POA: Diagnosis not present

## 2024-06-01 DIAGNOSIS — K612 Anorectal abscess: Secondary | ICD-10-CM | POA: Insufficient documentation

## 2024-06-01 DIAGNOSIS — T8189XA Other complications of procedures, not elsewhere classified, initial encounter: Secondary | ICD-10-CM | POA: Diagnosis not present

## 2024-06-01 DIAGNOSIS — I10 Essential (primary) hypertension: Secondary | ICD-10-CM

## 2024-06-01 DIAGNOSIS — D689 Coagulation defect, unspecified: Secondary | ICD-10-CM | POA: Diagnosis not present

## 2024-06-01 DIAGNOSIS — S41102A Unspecified open wound of left upper arm, initial encounter: Secondary | ICD-10-CM | POA: Diagnosis not present

## 2024-06-01 DIAGNOSIS — T8249XA Other complication of vascular dialysis catheter, initial encounter: Secondary | ICD-10-CM | POA: Diagnosis not present

## 2024-06-01 DIAGNOSIS — Z09 Encounter for follow-up examination after completed treatment for conditions other than malignant neoplasm: Secondary | ICD-10-CM | POA: Insufficient documentation

## 2024-06-01 DIAGNOSIS — N2581 Secondary hyperparathyroidism of renal origin: Secondary | ICD-10-CM | POA: Diagnosis not present

## 2024-06-01 DIAGNOSIS — D631 Anemia in chronic kidney disease: Secondary | ICD-10-CM | POA: Diagnosis not present

## 2024-06-01 DIAGNOSIS — E1122 Type 2 diabetes mellitus with diabetic chronic kidney disease: Secondary | ICD-10-CM | POA: Diagnosis not present

## 2024-06-01 MED ORDER — GABAPENTIN 300 MG PO CAPS: 300.0000 mg | ORAL_CAPSULE | Freq: Every day | ORAL | 0 refills | Status: DC

## 2024-06-01 MED ORDER — HYDROCODONE-ACETAMINOPHEN 5-325 MG PO TABS: 1.0000 | ORAL_TABLET | Freq: Four times a day (QID) | ORAL | 0 refills | Status: DC | PRN

## 2024-06-01 NOTE — Progress Notes (Signed)
  SUBJECTIVE:   CHIEF COMPLAINT / HPI:   Hospital F/u -I&D for perianal abscess and right popliteal abscess, started on Zosyn  switched to Cipro and Flagyl until 7/15.  Wanting dressing change, wound care Rn has not been set up. Also was not given any pain medicine. Reports no fevers or systemic symptoms. Bandages not changed since he left hospital 2 days ago.   HTN Is taking hydralazine  as prescribed   PERTINENT  PMH / PSH:   OBJECTIVE:  BP (!) 172/81   Pulse 91   Ht 6' 5 (1.956 m)   Wt 235 lb 12.8 oz (107 kg)   SpO2 100%   BMI 27.96 kg/m  Physical Exam Constitutional:      General: He is not in acute distress.    Appearance: Normal appearance. He is not ill-appearing.  Genitourinary:    Comments: 3 cm diameter wound with deep pocket, no drainage or bleeding, and is dry, located on right glut perianal area  1.5 cm diameter wound located on back of popliteal fossa, no drainage, erythema, or bleeding.  Neurological:     Mental Status: He is alert.      ASSESSMENT/PLAN:      Assessment & Plan Hospital discharge follow-up Patient comes in for hospital follow-up, following incision and drainage of wound behind right popliteal fossa, and right perianal/gluteal wound.  Patient reports pain poorly controlled as he is not receiving pain meds since discharge.  Patient also reports home health nurse has not been set up for his wound care.  After reading discharge summary found that patient supposed to have home health supply via [facility], called and spoke with [facility] office who noted referral has been placed today.  Plans for him to reach out to them in the next 2 days.  Will have patient follow-up with the clinic for wound care until RN is available for home visits.  Patient denies any fevers or systemic symptoms.  Patient taking Cipro and Flagyl until 7/15.  Will have patient continue antibiotics.  Popliteal gluteal wound were both changed/redressed with fresh bandages. -  Continue Cipro/Flagyl until 7/15 - Wound changes every other day, in clinic until home health RN available - Home health nurse agency contact given the patient Severe hypertension Patient comes in for follow-up, has noted to have severely elevated blood pressure.  Patient recently had blood pressure adjustment of medications following hospital discharge.  Patient reports compliance with medications.  Will have patient return for BP recheck. - Follow-up 2 days for BP recheck No follow-ups on file. Penne Rhein, MD 06/01/2024, 2:20 PM PGY-3, [facility]

## 2024-06-01 NOTE — Assessment & Plan Note (Addendum)
 Patient comes in for hospital follow-up, following incision and drainage of wound behind right popliteal fossa, and right perianal/gluteal wound.  Patient reports pain poorly controlled as he is not receiving pain meds since discharge.  Patient also reports home health nurse has not been set up for his wound care.  After reading discharge summary found that patient supposed to have home health supply via [facility], called and spoke with [facility] office who noted referral has been placed today.  Plans for him to reach out to them in the next 2 days.  Will have patient follow-up with the clinic for wound care until RN is available for home visits.  Patient denies any fevers or systemic symptoms.  Patient taking Cipro and Flagyl until 7/15.  Will have patient continue antibiotics.  Popliteal gluteal wound were both changed/redressed with fresh bandages. - Continue Cipro/Flagyl until 7/15 - Wound changes every other day, in clinic until home health RN available - Home health nurse agency contact given the patient

## 2024-06-01 NOTE — Patient Instructions (Addendum)
 It was great to see you! Thank you for allowing me to participate in your care!  I recommend that you always bring your medications to each appointment as this makes it easy to ensure we are on the correct medications and helps us  not miss when refills are needed.  Our plans for today:  - Hospital Follow up  Continue antibiotics (Cipro and Flagyl) until 7/15.   Home Health Nurse   To contact you in the next couple of days, the order just went in today.   If you've not heard anything in the next 2 days call:    [facility] Office:    [phone]  Make follow up appointment with our clinic, if not seen by wound care nurse in next 2 days   Seek Emergency Care via going to the Emergency Department if you develop fevers, swelling, redness at any of the wounds.  Take care and seek immediate care sooner if you develop any concerns.   Dr. Penne Rhein, MD [facility]

## 2024-06-01 NOTE — Assessment & Plan Note (Addendum)
 Patient comes in for follow-up, has noted to have severely elevated blood pressure.  Patient recently had blood pressure adjustment of medications following hospital discharge.  Patient reports compliance with medications.  Will have patient return for BP recheck. - Follow-up 2 days for BP recheck

## 2024-06-01 NOTE — Transitions of Care (Post Inpatient/ED Visit) (Signed)
   06/01/2024  Name: DOW BLAHNIK MRN: [id number] DOB: Jul 01, 1971  Today's TOC FU Call Status: Today's TOC FU Call Status:: Unsuccessful Call (2nd Attempt) Unsuccessful Call (2nd Attempt) Date: 06/01/24  Attempted to reach the patient regarding the most recent Inpatient/ED visit. Patient still at appointment.  Follow Up Plan: Additional outreach attempts will be made to reach the patient to complete the Transitions of Care (Post Inpatient/ED visit) call.    Bing Edison MSN, RN RN Case Sales executive Health  [facility] Office Hours M-F [phone] Direct Dial : [phone] Main Phone [phone]  Fax: [phone] [email or web address]

## 2024-06-02 ENCOUNTER — Telehealth: Payer: Self-pay

## 2024-06-02 DIAGNOSIS — D631 Anemia in chronic kidney disease: Secondary | ICD-10-CM | POA: Diagnosis not present

## 2024-06-02 DIAGNOSIS — Z89511 Acquired absence of right leg below knee: Secondary | ICD-10-CM | POA: Diagnosis not present

## 2024-06-02 DIAGNOSIS — H903 Sensorineural hearing loss, bilateral: Secondary | ICD-10-CM | POA: Diagnosis not present

## 2024-06-02 DIAGNOSIS — H9313 Tinnitus, bilateral: Secondary | ICD-10-CM | POA: Diagnosis not present

## 2024-06-02 DIAGNOSIS — Z89412 Acquired absence of left great toe: Secondary | ICD-10-CM | POA: Diagnosis not present

## 2024-06-02 DIAGNOSIS — N186 End stage renal disease: Secondary | ICD-10-CM | POA: Diagnosis not present

## 2024-06-02 DIAGNOSIS — E1122 Type 2 diabetes mellitus with diabetic chronic kidney disease: Secondary | ICD-10-CM | POA: Diagnosis not present

## 2024-06-02 DIAGNOSIS — E1165 Type 2 diabetes mellitus with hyperglycemia: Secondary | ICD-10-CM | POA: Diagnosis not present

## 2024-06-02 DIAGNOSIS — K610 Anal abscess: Secondary | ICD-10-CM | POA: Diagnosis not present

## 2024-06-02 DIAGNOSIS — I97648 Postprocedural seroma of a circulatory system organ or structure following other circulatory system procedure: Secondary | ICD-10-CM | POA: Diagnosis not present

## 2024-06-02 DIAGNOSIS — I120 Hypertensive chronic kidney disease with stage 5 chronic kidney disease or end stage renal disease: Secondary | ICD-10-CM | POA: Diagnosis not present

## 2024-06-02 DIAGNOSIS — T8131XD Disruption of external operation (surgical) wound, not elsewhere classified, subsequent encounter: Secondary | ICD-10-CM | POA: Diagnosis not present

## 2024-06-02 NOTE — Transitions of Care (Post Inpatient/ED Visit) (Signed)
   06/02/2024  Name: [name] [name] MRN: [id number] DOB: Nov 12, 1971  Today's TOC FU Call Status: Today's TOC FU Call Status:: Unsuccessful Call (3rd Attempt) Unsuccessful Call (3rd Attempt) Date: 06/02/24  Attempted to reach the patient regarding the most recent Inpatient/ED visit. ON chart review for this outreach attempt it was noted that patient had PCP HFU on 06/03/23.   Follow Up Plan: No further outreach attempts will be made at this time. We have been unable to contact the patient.   Bing Edison MSN, RN RN Case Sales executive Health  [facility] Office Hours M-F [phone] Direct Dial : [phone] Main Phone [phone]  Fax: [phone] [email or web address]

## 2024-06-03 DIAGNOSIS — E1122 Type 2 diabetes mellitus with diabetic chronic kidney disease: Secondary | ICD-10-CM | POA: Diagnosis not present

## 2024-06-03 DIAGNOSIS — D631 Anemia in chronic kidney disease: Secondary | ICD-10-CM | POA: Diagnosis not present

## 2024-06-03 DIAGNOSIS — D689 Coagulation defect, unspecified: Secondary | ICD-10-CM | POA: Diagnosis not present

## 2024-06-03 DIAGNOSIS — Z992 Dependence on renal dialysis: Secondary | ICD-10-CM | POA: Diagnosis not present

## 2024-06-03 DIAGNOSIS — N2581 Secondary hyperparathyroidism of renal origin: Secondary | ICD-10-CM | POA: Diagnosis not present

## 2024-06-03 DIAGNOSIS — N186 End stage renal disease: Secondary | ICD-10-CM | POA: Diagnosis not present

## 2024-06-03 DIAGNOSIS — T8249XA Other complication of vascular dialysis catheter, initial encounter: Secondary | ICD-10-CM | POA: Diagnosis not present

## 2024-06-03 DIAGNOSIS — I132 Hypertensive heart and chronic kidney disease with heart failure and with stage 5 chronic kidney disease, or end stage renal disease: Secondary | ICD-10-CM | POA: Diagnosis not present

## 2024-06-05 ENCOUNTER — Encounter

## 2024-06-06 ENCOUNTER — Other Ambulatory Visit: Payer: Self-pay | Admitting: Vascular Surgery

## 2024-06-06 DIAGNOSIS — E1122 Type 2 diabetes mellitus with diabetic chronic kidney disease: Secondary | ICD-10-CM | POA: Diagnosis not present

## 2024-06-06 DIAGNOSIS — N2581 Secondary hyperparathyroidism of renal origin: Secondary | ICD-10-CM | POA: Diagnosis not present

## 2024-06-06 DIAGNOSIS — N186 End stage renal disease: Secondary | ICD-10-CM

## 2024-06-06 DIAGNOSIS — T8249XA Other complication of vascular dialysis catheter, initial encounter: Secondary | ICD-10-CM | POA: Diagnosis not present

## 2024-06-06 DIAGNOSIS — I132 Hypertensive heart and chronic kidney disease with heart failure and with stage 5 chronic kidney disease, or end stage renal disease: Secondary | ICD-10-CM | POA: Diagnosis not present

## 2024-06-06 DIAGNOSIS — D689 Coagulation defect, unspecified: Secondary | ICD-10-CM | POA: Diagnosis not present

## 2024-06-06 DIAGNOSIS — Z992 Dependence on renal dialysis: Secondary | ICD-10-CM | POA: Diagnosis not present

## 2024-06-06 DIAGNOSIS — D631 Anemia in chronic kidney disease: Secondary | ICD-10-CM | POA: Diagnosis not present

## 2024-06-06 DIAGNOSIS — N185 Chronic kidney disease, stage 5: Secondary | ICD-10-CM

## 2024-06-06 NOTE — Progress Notes (Signed)
 Called and left a secure VM to call us  back or read his MyChart messages to review the results note.

## 2024-06-07 ENCOUNTER — Telehealth: Payer: Self-pay

## 2024-06-07 ENCOUNTER — Ambulatory Visit: Attending: Physician Assistant | Admitting: Physician Assistant

## 2024-06-07 ENCOUNTER — Other Ambulatory Visit: Payer: Self-pay | Admitting: Physician Assistant

## 2024-06-07 ENCOUNTER — Ambulatory Visit (INDEPENDENT_AMBULATORY_CARE_PROVIDER_SITE_OTHER): Admitting: Family Medicine

## 2024-06-07 VITALS — BP 162/85 | HR 85 | Wt 244.8 lb

## 2024-06-07 DIAGNOSIS — I770 Arteriovenous fistula, acquired: Secondary | ICD-10-CM

## 2024-06-07 DIAGNOSIS — K629 Disease of anus and rectum, unspecified: Secondary | ICD-10-CM | POA: Diagnosis not present

## 2024-06-07 DIAGNOSIS — Z5189 Encounter for other specified aftercare: Secondary | ICD-10-CM

## 2024-06-07 DIAGNOSIS — I10 Essential (primary) hypertension: Secondary | ICD-10-CM | POA: Diagnosis not present

## 2024-06-07 DIAGNOSIS — N186 End stage renal disease: Secondary | ICD-10-CM

## 2024-06-07 MED ORDER — HYDROCODONE-ACETAMINOPHEN 5-325 MG PO TABS: 1.0000 | ORAL_TABLET | Freq: Four times a day (QID) | ORAL | 0 refills | Status: DC | PRN

## 2024-06-07 MED ORDER — DOXYCYCLINE HYCLATE 100 MG PO CAPS
100.0000 mg | ORAL_CAPSULE | Freq: Two times a day (BID) | ORAL | 0 refills | Status: DC
Start: 2024-06-07 — End: 2024-06-11

## 2024-06-07 NOTE — Progress Notes (Signed)
    SUBJECTIVE:   CHIEF COMPLAINT / HPI:   Pain Hospitalized recently for incision and drainage of wound to posterior right popliteal fossa as well as right perianal/gluteal. He was seen in our clinic most recently 7/10 with pain and ongoing drainage; at that time had not had wound care set up. Was given short course of Norco 5-325mg  which did help with his pain; he is out of this now. Continuing Tylenol .  Today pt reports he completed full course of abx (Cipro and Flagyl). Has had wound care out to the house last week, has not heard from them about coming out this week; last dressing change this AM. Changes every day. He is having pain, clear/reddish drainage. Denies fevers, chills, N/V.  AV Fistula site pain He reports this morning his arm started hurting and when he looked he noted that wound was opening. Stitches still in place. Reports significant clear/yellowish drainage with some blood. Painful.  BP Elevated today. Did take all medications this AM. Denies HA, vision changes, chest pain/pressure, SOB.  PERTINENT  PMH / PSH: Reviewed. T2DM HTN CKD stage 5  OBJECTIVE:   BP (!) 162/85   Pulse 85   Wt 244 lb 12.8 oz (111 kg)   SpO2 99%   BMI 29.03 kg/m   General: Uncomfortable-appearing, no acute distress. HEENT: normocephalic, PERRLA, EOM grossly intact, MMM. Cardio: Regular rate, regular rhythm, no murmurs on exam. Pulm: No increased work of breathing.  Extremities:  - Right popliteal fossa with 2 cm wound, clean wound base without bleeding and only very scant clear to yellow drainage. No surrounding erythema. - Perianal area with deep pocket to 2.5 cm; no bleeding or drainage. No surrounding erythema. - Upper left extremity fistula site with dehiscence, minimal clear drainage, mild erythema but no warmth to the area:     ASSESSMENT/PLAN:   Assessment & Plan Wound check, abscess Perianal and posterior popliteal wounds appear to be healing well, no evidence of  infection. - continue good wound care with regular dressing changes; pt has all supplies and is able to complete this - 5 days of Norco q6h PRN for pain; hopeful he will be able to step down pain regimen soon - f/u in 1 week for reassessment Severe hypertension Uncontrolled today, though did improve on recheck. Likely elevated in the setting of significant pain. - continue all medications - f/u in 1 week to recheck BP in hopes that pain will be improving - return precautions given AV fistula (HCC) Recent surgical site does show dehiscence. No apparent drainage or sign of infection. - pt advised to call [facility] as soon as possible today to make them aware and discuss appt    Lauraine Norse, DO [facility]

## 2024-06-07 NOTE — Assessment & Plan Note (Signed)
 Uncontrolled today, though did improve on recheck. Likely elevated in the setting of significant pain. - continue all medications - f/u in 1 week to recheck BP in hopes that pain will be improving - return precautions given

## 2024-06-07 NOTE — Progress Notes (Signed)
 POST OPERATIVE OFFICE NOTE    CC:  F/u for surgery  HPI:  This is a 53 y.o. male who is s/p left second stage basilic vein transposition.  He developed a seroma postoperatively and was brought back to the operating room on 05/17/2024 for irrigation and debridement with incisional VAC placement.  This was removed after a week however he continued to drain serous fluid from his incision.  A peel in place wound VAC with home pump was arranged for him.  He continues to pick at the dressing and the VAC suction continues to fail.  He was brought back to the clinic today with an increase in serous drainage since discontinuing the wound VAC.  He says the skin edges have separated and drainage has increased.  Drainage has been clear in nature.  He denies any pain in the left hand.  He is dialyzing from a TDC.  Allergies  Allergen Reactions   Iron  Nausea And Vomiting    With the iron  tablets   Tape Rash    Rash at site of tape--okay to use paper tape    Current Outpatient Medications  Medication Sig Dispense Refill   Accu-Chek Softclix Lancets lancets Use to check blood sugar twice per day. E11.9 100 each 12   acetaminophen  (TYLENOL ) 500 MG tablet Take 2 tablets (1,000 mg total) by mouth every 6 (six) hours as needed. (Patient not taking: Reported on 05/24/2024) 30 tablet 0   albuterol  (VENTOLIN  HFA) 108 (90 Base) MCG/ACT inhaler Inhale 2 puffs into the lungs every 6 (six) hours as needed for wheezing or shortness of breath. 8 g 2   atorvastatin  (LIPITOR) 40 MG tablet TAKE 1 TABLET(40 MG) BY MOUTH AT BEDTIME 90 tablet 0   Blood Glucose Monitoring Suppl (ACCU-CHEK GUIDE ME) w/Device KIT See admin instructions.     calcitRIOL  (ROCALTROL ) 0.25 MCG capsule Take 1 capsule (0.25 mcg total) by mouth Every Tuesday,Thursday,and Saturday with dialysis. 30 capsule 0   Calcium  Acetate 667 MG TABS Take 1,334 mg by mouth 3 (three) times daily.     diltiazem  (CARDIZEM  CD) 300 MG 24 hr capsule TAKE 1 CAPSULE(300 MG)  BY MOUTH DAILY 90 capsule 0   doxycycline  (VIBRAMYCIN ) 100 MG capsule Take 1 capsule (100 mg total) by mouth 2 (two) times daily for 14 days. 28 capsule 0   FLUoxetine  (PROZAC ) 20 MG capsule TAKE 1 CAPSULE(20 MG) BY MOUTH DAILY 90 capsule 2   fluticasone  (FLONASE ) 50 MCG/ACT nasal spray Place 2 sprays into both nostrils daily. 16 g 6   Fluticasone -Umeclidin-Vilant (TRELEGY ELLIPTA ) 100-62.5-25 MCG/ACT AEPB INHALE 1 PUFF INTO LUNGS DAILY 60 each 3   gabapentin  (NEURONTIN ) 300 MG capsule Take 1 capsule (300 mg total) by mouth daily. 30 capsule 0   glucose blood (AGAMATRIX PRESTO TEST) test strip Check blood sugars twice daily before meals 100 each 12   glucose blood test strip 1 each by Other route as needed for other. Use as instructed     hydrALAZINE  (APRESOLINE ) 50 MG tablet Take 1 tablet (50 mg total) by mouth 3 (three) times daily. 90 tablet 0   HYDROcodone -acetaminophen  (NORCO/VICODIN) 5-325 MG tablet Take 1 tablet by mouth every 6 (six) hours as needed for up to 5 days for severe pain (pain score 7-10). 20 tablet 0   insulin  lispro (HUMALOG ) 100 UNIT/ML injection 0-6 Units, Subcutaneous, 3 times daily with meals, First dose on Sun 10/17/23 at 0800 Correction coverage: Very Sensitive (ESRD/Dialysis) CBG < 70: Implement Hypoglycemia Standing Orders and  refer to Hypoglycemia Standing Orders sidebar report CBG 70 - 120: 0 units CBG 121 - 150: 0 units CBG 151 - 200: 1 unit CBG 201-250: 2 units CBG 251-300: 3 units CBG 301-350: 4 units CBG 351-400: 5 units CBG > 400: Give 6 units and call MD (Patient taking differently: Inject into the skin 2 (two) times daily. 0-6 Units, Subcutaneous, 3 times daily with meals, as needed  Correction coverage: Very Sensitive (ESRD/Dialysis) CBG < 70: Implement Hypoglycemia Standing Orders and refer to Hypoglycemia Standing Orders sidebar report CBG 70 - 120: 0 units CBG 121 - 150: 0 units CBG 151 - 200: 1 unit CBG 201-250: 2 units CBG 251-300: 3 units CBG  301-350: 4 units CBG 351-400: 5 units CBG > 400: Give 6 units and call MD)     lidocaine  (HM LIDOCAINE  PATCH) 4 % Place 1 patch onto the skin daily. 30 patch 0   loratadine  (CLARITIN ) 10 MG tablet Take 1 tablet (10 mg total) by mouth daily. 30 tablet 11   losartan  (COZAAR ) 25 MG tablet TAKE 1 TABLET(25 MG) BY MOUTH DAILY 90 tablet 0   metoprolol  succinate (TOPROL -XL) 25 MG 24 hr tablet Take 25 mg by mouth daily.     ondansetron  (ZOFRAN -ODT) 4 MG disintegrating tablet Take 1 tablet (4 mg total) by mouth every 8 (eight) hours as needed for nausea or vomiting. 20 tablet 0   pantoprazole  (PROTONIX ) 40 MG tablet TAKE 1 TABLET(40 MG) BY MOUTH DAILY 90 tablet 0   polyethylene glycol (MIRALAX  / GLYCOLAX ) 17 g packet Take 17 g by mouth daily. (Patient not taking: Reported on 05/24/2024) 30 each 2   sevelamer  carbonate (RENVELA ) 800 MG tablet Take 2 tablets (1,600 mg total) by mouth 3 (three) times daily with meals. (Patient not taking: Reported on 05/24/2024) 180 tablet 0   tamsulosin  (FLOMAX ) 0.4 MG CAPS capsule TAKE 1 CAPSULE(0.4 MG) BY MOUTH DAILY AFTER SUPPER (Patient not taking: Reported on 05/24/2024) 90 capsule 0   tiZANidine  (ZANAFLEX ) 2 MG tablet TAKE 1 TABLET(2 MG) BY MOUTH TWICE DAILY AS NEEDED FOR MUSCLE SPASMS 60 tablet 0   No current facility-administered medications for this visit.     ROS:  See HPI  Physical Exam:  Incision: Dehiscence of the incision in the distal arm with visible seroma capsule several centimeters from the fistula Extremities: Palpable left radial pulse; palpable thrill throughout the fistula  Assessment/Plan:  This is a 53 y.o. male with nonhealing surgical wound  Seroma was evacuated from the incision.  Fistula was not present in the wound bed.  Nylon sutures were tightened to bring the skin edges closer together.  A peel in place wound VAC was reapplied in the office today.  He was wrapped with an Ace bandage.  We stressed the importance of not picking at the bandage  over the next week.  He will follow-up in 1 week for another wound check.  He was also started on 2 weeks of doxycycline .  I again asked him to stop smoking at least until he has completely healed his incision however he says he will be unable to do that.  He is aware he may require another return to the operating room for washout.   Donnice Sender, PA-C [facility] [phone]  Clinic MD:  Sheree

## 2024-06-07 NOTE — Telephone Encounter (Signed)
 Patient calls nurse line requesting a refill on Hydrocodone .  He reports he was given a small supply at hospital followup with Dr. Sowell.  He reports 8/10 pain from perianal abscess and right leg. He reports milky grey drainage coming from both sites. He reports warmth and mild swelling.  He denies any fevers or chills. No odors. No nausea or vomiting.   Patient advised he would need an evaluation.  Patient scheduled for this morning.

## 2024-06-07 NOTE — Patient Instructions (Addendum)
 It was so good to see you today! Thank you for allowing me to take care of you.  Today we discussed the following concerns and plans:  Wounds - Leg and bottom wounds look better; they will take time to heal - continue wound care as discussed - take Norco as needed every 6 hours for pain - DO NOT take with Tylenol   Fistula wound - Please call your Vascular doctor today and see if you need an appointment with them  If you have any concerns, please call the clinic or schedule an appointment.  It was a pleasure to take care of you today. Be well!  Lauraine Norse, DO [facility], PGY-2  Don't forget to check out the [facility] in [facility] at [address] [phone] Affordable prices on prescriptions and over-the-counter items, as well as services like vaccinations and medication home delivery.

## 2024-06-08 ENCOUNTER — Ambulatory Visit (INDEPENDENT_AMBULATORY_CARE_PROVIDER_SITE_OTHER): Admitting: Podiatry

## 2024-06-08 DIAGNOSIS — S88912S Complete traumatic amputation of left lower leg, level unspecified, sequela: Secondary | ICD-10-CM

## 2024-06-08 DIAGNOSIS — T8249XA Other complication of vascular dialysis catheter, initial encounter: Secondary | ICD-10-CM | POA: Diagnosis not present

## 2024-06-08 DIAGNOSIS — E0843 Diabetes mellitus due to underlying condition with diabetic autonomic (poly)neuropathy: Secondary | ICD-10-CM

## 2024-06-08 DIAGNOSIS — B351 Tinea unguium: Secondary | ICD-10-CM

## 2024-06-08 DIAGNOSIS — Z992 Dependence on renal dialysis: Secondary | ICD-10-CM | POA: Diagnosis not present

## 2024-06-08 DIAGNOSIS — N2581 Secondary hyperparathyroidism of renal origin: Secondary | ICD-10-CM | POA: Diagnosis not present

## 2024-06-08 DIAGNOSIS — D631 Anemia in chronic kidney disease: Secondary | ICD-10-CM | POA: Diagnosis not present

## 2024-06-08 DIAGNOSIS — E1122 Type 2 diabetes mellitus with diabetic chronic kidney disease: Secondary | ICD-10-CM | POA: Diagnosis not present

## 2024-06-08 DIAGNOSIS — N186 End stage renal disease: Secondary | ICD-10-CM | POA: Diagnosis not present

## 2024-06-08 DIAGNOSIS — M79675 Pain in left toe(s): Secondary | ICD-10-CM

## 2024-06-08 DIAGNOSIS — I132 Hypertensive heart and chronic kidney disease with heart failure and with stage 5 chronic kidney disease, or end stage renal disease: Secondary | ICD-10-CM | POA: Diagnosis not present

## 2024-06-08 DIAGNOSIS — D689 Coagulation defect, unspecified: Secondary | ICD-10-CM | POA: Diagnosis not present

## 2024-06-08 NOTE — Progress Notes (Signed)
 This patient returns to my office for at risk foot care.  This patient requires this care by a professional since this patient will be at risk due to having diabetes and vascular pathology.  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  This patient is unable to cut nails himself since the patient cannot reach his nails.These nails are painful walking and wearing shoes.  This patient presents for at risk foot care today.  General Appearance  Alert, conversant and in no acute stress.  Vascular  Dorsalis pedis and posterior tibial  pulses are  weakly palpable left     Capillary return is within normal limits  left  Temperature is within normal limits  left  Neurologic  Senn-Weinstein monofilament wire test diminished  left  Muscle power within normal limits bilaterally.  Nails Thick disfigured discolored nails with subungual debris  from second toe fifth toes left foot. No evidence of bacterial infection or drainage   Orthopedic  No limitations of motion  feet .  No crepitus or effusions noted.  No bony pathology or digital deformities noted.  Skin  normotropic skin with no porokeratosis noted bilaterally.  No signs of infections or ulcers noted.     Onychomycosis  Pain in left toes  Consent was obtained for treatment procedures.   Mechanical debridement of nails 2 -5  left performed with a nail nipper.  Filed with dremel without incident.    Return office visit     prn                Told patient to return for periodic foot care and evaluation due to potential at risk complications.   Cordella Bold DPM

## 2024-06-09 ENCOUNTER — Emergency Department

## 2024-06-09 ENCOUNTER — Inpatient Hospital Stay
Admission: EM | Admit: 2024-06-09 | Discharge: 2024-06-11 | DRG: 602 | Disposition: A | Discharge status: Home / Self Care | Attending: Internal Medicine | Admitting: Internal Medicine

## 2024-06-09 ENCOUNTER — Other Ambulatory Visit: Payer: Self-pay

## 2024-06-09 ENCOUNTER — Encounter: Payer: Self-pay | Admitting: Emergency Medicine

## 2024-06-09 DIAGNOSIS — Z89511 Acquired absence of right leg below knee: Secondary | ICD-10-CM | POA: Diagnosis not present

## 2024-06-09 DIAGNOSIS — E785 Hyperlipidemia, unspecified: Secondary | ICD-10-CM | POA: Diagnosis present

## 2024-06-09 DIAGNOSIS — J90 Pleural effusion, not elsewhere classified: Secondary | ICD-10-CM | POA: Diagnosis not present

## 2024-06-09 DIAGNOSIS — E8770 Fluid overload, unspecified: Principal | ICD-10-CM | POA: Diagnosis present

## 2024-06-09 DIAGNOSIS — R161 Splenomegaly, not elsewhere classified: Secondary | ICD-10-CM | POA: Diagnosis not present

## 2024-06-09 DIAGNOSIS — E1151 Type 2 diabetes mellitus with diabetic peripheral angiopathy without gangrene: Secondary | ICD-10-CM | POA: Diagnosis present

## 2024-06-09 DIAGNOSIS — F17210 Nicotine dependence, cigarettes, uncomplicated: Secondary | ICD-10-CM | POA: Diagnosis not present

## 2024-06-09 DIAGNOSIS — L03116 Cellulitis of left lower limb: Principal | ICD-10-CM | POA: Diagnosis present

## 2024-06-09 DIAGNOSIS — D631 Anemia in chronic kidney disease: Secondary | ICD-10-CM | POA: Diagnosis not present

## 2024-06-09 DIAGNOSIS — Z794 Long term (current) use of insulin: Secondary | ICD-10-CM | POA: Diagnosis not present

## 2024-06-09 DIAGNOSIS — R0602 Shortness of breath: Secondary | ICD-10-CM | POA: Diagnosis not present

## 2024-06-09 DIAGNOSIS — I1A0 Resistant hypertension: Secondary | ICD-10-CM | POA: Diagnosis present

## 2024-06-09 DIAGNOSIS — N2581 Secondary hyperparathyroidism of renal origin: Secondary | ICD-10-CM | POA: Diagnosis not present

## 2024-06-09 DIAGNOSIS — F32A Depression, unspecified: Secondary | ICD-10-CM | POA: Diagnosis present

## 2024-06-09 DIAGNOSIS — I2510 Atherosclerotic heart disease of native coronary artery without angina pectoris: Secondary | ICD-10-CM | POA: Diagnosis present

## 2024-06-09 DIAGNOSIS — M7989 Other specified soft tissue disorders: Secondary | ICD-10-CM | POA: Diagnosis not present

## 2024-06-09 DIAGNOSIS — I120 Hypertensive chronic kidney disease with stage 5 chronic kidney disease or end stage renal disease: Secondary | ICD-10-CM | POA: Diagnosis present

## 2024-06-09 DIAGNOSIS — N186 End stage renal disease: Secondary | ICD-10-CM | POA: Diagnosis not present

## 2024-06-09 DIAGNOSIS — Z91119 Patient's noncompliance with dietary regimen due to unspecified reason: Secondary | ICD-10-CM

## 2024-06-09 DIAGNOSIS — E118 Type 2 diabetes mellitus with unspecified complications: Secondary | ICD-10-CM | POA: Diagnosis not present

## 2024-06-09 DIAGNOSIS — E1122 Type 2 diabetes mellitus with diabetic chronic kidney disease: Secondary | ICD-10-CM | POA: Diagnosis present

## 2024-06-09 DIAGNOSIS — L89310 Pressure ulcer of right buttock, unstageable: Secondary | ICD-10-CM | POA: Diagnosis not present

## 2024-06-09 DIAGNOSIS — Z992 Dependence on renal dialysis: Secondary | ICD-10-CM

## 2024-06-09 DIAGNOSIS — R590 Localized enlarged lymph nodes: Secondary | ICD-10-CM | POA: Diagnosis not present

## 2024-06-09 DIAGNOSIS — R0789 Other chest pain: Secondary | ICD-10-CM | POA: Diagnosis not present

## 2024-06-09 DIAGNOSIS — J9811 Atelectasis: Secondary | ICD-10-CM | POA: Diagnosis not present

## 2024-06-09 DIAGNOSIS — I10 Essential (primary) hypertension: Secondary | ICD-10-CM | POA: Diagnosis present

## 2024-06-09 DIAGNOSIS — J449 Chronic obstructive pulmonary disease, unspecified: Secondary | ICD-10-CM | POA: Diagnosis present

## 2024-06-09 DIAGNOSIS — R0989 Other specified symptoms and signs involving the circulatory and respiratory systems: Secondary | ICD-10-CM | POA: Diagnosis not present

## 2024-06-09 DIAGNOSIS — I152 Hypertension secondary to endocrine disorders: Secondary | ICD-10-CM | POA: Diagnosis present

## 2024-06-09 DIAGNOSIS — J984 Other disorders of lung: Secondary | ICD-10-CM | POA: Diagnosis not present

## 2024-06-09 DIAGNOSIS — F17200 Nicotine dependence, unspecified, uncomplicated: Secondary | ICD-10-CM | POA: Diagnosis present

## 2024-06-09 DIAGNOSIS — R079 Chest pain, unspecified: Secondary | ICD-10-CM

## 2024-06-09 DIAGNOSIS — R600 Localized edema: Secondary | ICD-10-CM | POA: Diagnosis not present

## 2024-06-09 DIAGNOSIS — E119 Type 2 diabetes mellitus without complications: Secondary | ICD-10-CM

## 2024-06-09 DIAGNOSIS — M79605 Pain in left leg: Secondary | ICD-10-CM | POA: Diagnosis not present

## 2024-06-09 LAB — GLUCOSE, CAPILLARY: Glucose-Capillary: 133 mg/dL — ABNORMAL HIGH (ref 70–99)

## 2024-06-09 LAB — CBC
HCT: 29.3 % — ABNORMAL LOW (ref 39.0–52.0)
Hemoglobin: 8.7 g/dL — ABNORMAL LOW (ref 13.0–17.0)
MCH: 23.4 pg — ABNORMAL LOW (ref 26.0–34.0)
MCHC: 29.7 g/dL — ABNORMAL LOW (ref 30.0–36.0)
MCV: 78.8 fL — ABNORMAL LOW (ref 80.0–100.0)
Platelets: 154 K/uL (ref 150–400)
RBC: 3.72 MIL/uL — ABNORMAL LOW (ref 4.22–5.81)
RDW: 19.3 % — ABNORMAL HIGH (ref 11.5–15.5)
WBC: 8.4 K/uL (ref 4.0–10.5)
nRBC: 0 % (ref 0.0–0.2)

## 2024-06-09 LAB — BASIC METABOLIC PANEL WITH GFR
Anion gap: 14 (ref 5–15)
BUN: 35 mg/dL — ABNORMAL HIGH (ref 6–20)
CO2: 23 mmol/L (ref 22–32)
Calcium: 8.3 mg/dL — ABNORMAL LOW (ref 8.9–10.3)
Chloride: 101 mmol/L (ref 98–111)
Creatinine, Ser: 6.69 mg/dL — ABNORMAL HIGH (ref 0.61–1.24)
GFR, Estimated: 9 mL/min — ABNORMAL LOW (ref >60)
Glucose, Bld: 285 mg/dL — ABNORMAL HIGH (ref 70–99)
Potassium: 4.1 mmol/L (ref 3.5–5.1)
Sodium: 138 mmol/L (ref 135–145)

## 2024-06-09 LAB — TROPONIN I (HIGH SENSITIVITY)
Troponin I (High Sensitivity): 40 ng/L — ABNORMAL HIGH (ref <18)
Troponin I (High Sensitivity): 39 ng/L — ABNORMAL HIGH (ref <18)

## 2024-06-09 LAB — BRAIN NATRIURETIC PEPTIDE: B Natriuretic Peptide: >4500 pg/mL — ABNORMAL HIGH (ref 0.0–100.0)

## 2024-06-09 MED ORDER — ONDANSETRON 4 MG PO TBDP: 4.0000 mg | ORAL_TABLET | Freq: Three times a day (TID) | ORAL | Status: DC | PRN

## 2024-06-09 MED ORDER — CHLORHEXIDINE GLUCONATE CLOTH 2 % EX PADS
6.0000 | MEDICATED_PAD | Freq: Every day | CUTANEOUS | Status: DC
  Administered 2024-06-11: 6 via TOPICAL

## 2024-06-09 MED ORDER — METOPROLOL SUCCINATE ER 25 MG PO TB24
25.0000 mg | ORAL_TABLET | Freq: Every day | ORAL | Status: DC
Start: 2024-06-09 — End: 2024-06-11
  Administered 2024-06-09 – 2024-06-11 (×3): 25 mg via ORAL
  Filled 2024-06-09 (×4): qty 1

## 2024-06-09 MED ORDER — IOHEXOL 350 MG/ML SOLN
75.0000 mL | Freq: Once | INTRAVENOUS | Status: AC | PRN
  Administered 2024-06-09: 75 mL via INTRAVENOUS

## 2024-06-09 MED ORDER — ATORVASTATIN CALCIUM 20 MG PO TABS
40.0000 mg | ORAL_TABLET | Freq: Every day | ORAL | Status: DC
  Administered 2024-06-09 – 2024-06-11 (×3): 40 mg via ORAL
  Filled 2024-06-09 (×3): qty 2

## 2024-06-09 MED ORDER — CALCITRIOL 0.25 MCG PO CAPS
0.2500 ug | ORAL_CAPSULE | ORAL | Status: DC
Start: 2024-06-10 — End: 2024-06-11
  Administered 2024-06-10: 0.25 ug via ORAL
  Filled 2024-06-09: qty 1

## 2024-06-09 MED ORDER — FUROSEMIDE 10 MG/ML IJ SOLN
40.0000 mg | Freq: Once | INTRAMUSCULAR | Status: AC
  Administered 2024-06-09: 40 mg via INTRAVENOUS
  Filled 2024-06-09: qty 4

## 2024-06-09 MED ORDER — ALBUTEROL SULFATE (2.5 MG/3ML) 0.083% IN NEBU: 2.5000 mg | INHALATION_SOLUTION | RESPIRATORY_TRACT | Status: DC | PRN

## 2024-06-09 MED ORDER — DILTIAZEM HCL ER COATED BEADS 300 MG PO CP24
300.0000 mg | ORAL_CAPSULE | Freq: Every day | ORAL | Status: DC
  Administered 2024-06-09 – 2024-06-11 (×3): 300 mg via ORAL
  Filled 2024-06-09 (×4): qty 1

## 2024-06-09 MED ORDER — GABAPENTIN 300 MG PO CAPS
300.0000 mg | ORAL_CAPSULE | Freq: Every day | ORAL | Status: DC
  Administered 2024-06-10 – 2024-06-11 (×2): 300 mg via ORAL
  Filled 2024-06-09 (×2): qty 1

## 2024-06-09 MED ORDER — FLUOXETINE HCL 20 MG PO CAPS
20.0000 mg | ORAL_CAPSULE | Freq: Every day | ORAL | Status: DC
  Administered 2024-06-10 – 2024-06-11 (×2): 20 mg via ORAL
  Filled 2024-06-09 (×3): qty 1

## 2024-06-09 MED ORDER — LOSARTAN POTASSIUM 25 MG PO TABS
25.0000 mg | ORAL_TABLET | Freq: Every day | ORAL | Status: DC
  Administered 2024-06-09 – 2024-06-11 (×3): 25 mg via ORAL
  Filled 2024-06-09 (×3): qty 1

## 2024-06-09 MED ORDER — CALCIUM ACETATE (PHOS BINDER) 667 MG PO CAPS
1334.0000 mg | ORAL_CAPSULE | Freq: Three times a day (TID) | ORAL | Status: DC
  Administered 2024-06-10 – 2024-06-11 (×4): 1334 mg via ORAL
  Filled 2024-06-09 (×5): qty 2

## 2024-06-09 MED ORDER — HYDRALAZINE HCL 50 MG PO TABS
50.0000 mg | ORAL_TABLET | Freq: Three times a day (TID) | ORAL | Status: DC
  Administered 2024-06-09 – 2024-06-10 (×2): 50 mg via ORAL
  Filled 2024-06-09 (×3): qty 1

## 2024-06-09 MED ORDER — CEFAZOLIN SODIUM-DEXTROSE 1-4 GM/50ML-% IV SOLN
1.0000 g | INTRAVENOUS | Status: DC
  Administered 2024-06-10 (×2): 1 g via INTRAVENOUS
  Filled 2024-06-09 (×2): qty 50

## 2024-06-09 MED ORDER — HYDRALAZINE HCL 20 MG/ML IJ SOLN
10.0000 mg | Freq: Four times a day (QID) | INTRAMUSCULAR | Status: DC | PRN
  Filled 2024-06-09: qty 1

## 2024-06-09 MED ORDER — INSULIN ASPART 100 UNIT/ML IJ SOLN: 0.0000 [IU] | Freq: Three times a day (TID) | INTRAMUSCULAR | Status: DC

## 2024-06-09 MED ORDER — BUDESON-GLYCOPYRROL-FORMOTEROL 160-9-4.8 MCG/ACT IN AERO
2.0000 | INHALATION_SPRAY | Freq: Two times a day (BID) | RESPIRATORY_TRACT | Status: DC
  Administered 2024-06-09 – 2024-06-11 (×4): 2 via RESPIRATORY_TRACT
  Filled 2024-06-09: qty 5.9

## 2024-06-09 MED ORDER — HYDROCODONE-ACETAMINOPHEN 5-325 MG PO TABS
1.0000 | ORAL_TABLET | Freq: Four times a day (QID) | ORAL | Status: DC | PRN
  Administered 2024-06-10 – 2024-06-11 (×3): 1 via ORAL
  Filled 2024-06-09 (×3): qty 1

## 2024-06-09 MED ORDER — SODIUM CHLORIDE 0.9 % IV SOLN
100.0000 mg | Freq: Once | INTRAVENOUS | Status: DC
  Administered 2024-06-09: 100 mg via INTRAVENOUS
  Filled 2024-06-09: qty 100

## 2024-06-09 MED ORDER — HEPARIN SODIUM (PORCINE) 5000 UNIT/ML IJ SOLN: 5000.0000 [IU] | Freq: Three times a day (TID) | INTRAMUSCULAR | Status: DC

## 2024-06-09 MED ORDER — PANTOPRAZOLE SODIUM 40 MG PO TBEC
40.0000 mg | DELAYED_RELEASE_TABLET | Freq: Every day | ORAL | Status: DC
Start: 2024-06-10 — End: 2024-06-11
  Administered 2024-06-10 – 2024-06-11 (×2): 40 mg via ORAL
  Filled 2024-06-09 (×3): qty 1

## 2024-06-09 MED ORDER — CHLORHEXIDINE GLUCONATE CLOTH 2 % EX PADS: 6.0000 | MEDICATED_PAD | Freq: Every day | CUTANEOUS | Status: DC

## 2024-06-09 MED ORDER — SODIUM CHLORIDE 0.9 % IV SOLN
1.0000 g | Freq: Once | INTRAVENOUS | Status: AC
  Administered 2024-06-09: 1 g via INTRAVENOUS
  Filled 2024-06-09: qty 10

## 2024-06-09 MED ORDER — METRONIDAZOLE 500 MG/100ML IV SOLN
500.0000 mg | Freq: Two times a day (BID) | INTRAVENOUS | Status: DC
  Administered 2024-06-09 – 2024-06-11 (×4): 500 mg via INTRAVENOUS
  Filled 2024-06-09 (×5): qty 100

## 2024-06-09 MED ORDER — CEFAZOLIN SODIUM-DEXTROSE 2-4 GM/100ML-% IV SOLN: 2.0000 g | Freq: Three times a day (TID) | INTRAVENOUS | Status: DC

## 2024-06-09 NOTE — Progress Notes (Signed)
 [facility]  PROGRESS NOTE   Subjective:   Chart reviewed. Patient is comfortable after lasix .   Objective:  Vital signs: Blood pressure (!) 186/90, pulse (!) 108, temperature 98.9 F (37.2 C), temperature source Oral, resp. rate 18, height 6' 5 (1.956 m), weight 111.1 kg, SpO2 96%.  Intake/Output Summary (Last 24 hours) at 06/09/2024 2137 Last data filed at 06/09/2024 1853 Gross per 24 hour  Intake 100 ml  Output --  Net 100 ml   Filed Weights   06/09/24 1556  Weight: 111.1 kg      Basic Metabolic Panel: Recent Labs  Lab 06/09/24 1559  NA 138  K 4.1  CL 101  CO2 23  GLUCOSE 285*  BUN 35*  CREATININE 6.69*  CALCIUM  8.3*   GFR: Estimated Creatinine Clearance: 18.1 mL/min (A) (by C-G formula based on SCr of 6.69 mg/dL (H)).  Liver Function Tests: No results for input(s): AST, ALT, ALKPHOS, BILITOT, PROT, ALBUMIN  in the last 168 hours. No results for input(s): LIPASE, AMYLASE in the last 168 hours. No results for input(s): AMMONIA in the last 168 hours.  CBC: Recent Labs  Lab 06/09/24 1559  WBC 8.4  HGB 8.7*  HCT 29.3*  MCV 78.8*  PLT 154     HbA1C: HbA1c, POC (controlled diabetic range)  Date/Time Value Ref Range Status  05/18/2023 01:56 PM 6.6 0.0 - 7.0 % Final   Hgb A1c MFr Bld  Date/Time Value Ref Range Status  10/13/2023 10:50 PM 6.1 (H) 4.8 - 5.6 % Final    Comment:    (NOTE) Pre diabetes:          5.7%-6.4%  Diabetes:              >6.4%  Glycemic control for   <7.0% adults with diabetes   09/11/2022 11:08 AM 7.2 (H) 4.8 - 5.6 % Final    Comment:    (NOTE) Pre diabetes:          5.7%-6.4%  Diabetes:              >6.4%  Glycemic control for   <7.0% adults with diabetes     Urinalysis: No results for input(s): COLORURINE, LABSPEC, PHURINE, GLUCOSEU, HGBUR, BILIRUBINUR, KETONESUR, PROTEINUR, UROBILINOGEN, NITRITE, LEUKOCYTESUR in the last 72 hours.  Invalid input(s):  APPERANCEUR    Imaging: CT Angio Chest PE W/Cm &/Or Wo Cm Result Date: 06/09/2024 CLINICAL DATA:  Pulmonary embolism (PE) suspected, high prob ight side CP- ongoing since last week. States it started after having a cyst removed on rectum and right knee. Denies cardiac hx. Also having left leg pain/redness x3 days. EXAM: CT ANGIOGRAPHY CHEST WITH CONTRAST TECHNIQUE: Multidetector CT imaging of the chest was performed using the standard protocol during bolus administration of intravenous contrast. Multiplanar CT image reconstructions and MIPs were obtained to evaluate the vascular anatomy. RADIATION DOSE REDUCTION: This exam was performed according to the departmental dose-optimization program which includes automated exposure control, adjustment of the mA and/or kV according to patient size and/or use of iterative reconstruction technique. CONTRAST:  75mL OMNIPAQUE  IOHEXOL  350 MG/ML SOLN COMPARISON:  CT abdomen pelvis 01/22/2024, CT abdomen pelvis 08/18/2019, CT chest 04/13/2024 FINDINGS: Cardiovascular: Right chest wall dialysis catheter with tip in the right atrium. Satisfactory opacification of the pulmonary arteries to the segmental level. No evidence of pulmonary embolism. Limited evaluation of the subsegmental level due to timing of contrast. Prominent heart size. No significant pericardial effusion. The thoracic aorta is normal in caliber. No atherosclerotic plaque  of the thoracic aorta. At least 2 vessel coronary artery calcifications. Mediastinum/Nodes: Redemonstration of multiple prominent and borderline enlarged mediastinal lymph nodes. No enlarged hilar or axillary lymph nodes. Thyroid gland, trachea, and esophagus demonstrate no significant findings. Lungs/Pleura: No focal consolidation. No pulmonary nodule. No pulmonary mass. No increase of bilateral small to moderate volume pleural effusions. No pneumothorax. Upper Abdomen: Redemonstration of a chronic left adrenal gland nodule consistent with  adenoma-no further follow-up indicated. Enlarged spleen measuring up to 15 cm. Musculoskeletal: No chest wall abnormality.  Subcutaneus soft tissue edema. No suspicious lytic or blastic osseous lesions. No acute displaced fracture. Multilevel degenerative changes of the spine. Similar-appearing exaggerated kyphotic curvature of the mid to lower thoracic spine with multilevel vertebral body height loss. Interval increase in vertebral body height loss at the L1 superior endplate. Review of the MIP images confirms the above findings. IMPRESSION: 1. No central or segmental pulmonary embolus. Limited evaluation more distally due to timing of contrast. 2. Interval increase of bilateral small to moderate volume pleural effusions. 3. Persistent prominent/borderline enlarged mediastinal lymph nodes that may be reactive in etiology. Recommend attention on follow-up. 4. Splenomegaly. 5. Interval increase in vertebral body height loss at the L1 superior endplate. Correlate with point tenderness to palpation to evaluate for an acute component. 6. At least 2 vessel coronary artery calcification. Electronically Signed   By: Morgane  Naveau M.D.   On: 06/09/2024 18:34   US  Venous Img Lower Unilateral Left Result Date: 06/09/2024 CLINICAL DATA:  edema left lower extremity. Pain. Tobacco use. Color changes and ulcerations EXAM: Left LOWER EXTREMITY VENOUS DOPPLER ULTRASOUND TECHNIQUE: Gray-scale sonography with compression, as well as color and duplex ultrasound, were performed to evaluate the deep venous system(s) from the level of the common femoral vein through the popliteal and proximal calf veins. COMPARISON:  None Available. FINDINGS: VENOUS Normal compressibility of the common femoral, superficial femoral, and popliteal veins, as well as the visualized calf veins. Visualized portions of profunda femoral vein and great saphenous vein unremarkable. No filling defects to suggest DVT on grayscale or color Doppler imaging.  Doppler waveforms show normal direction of venous flow, normal respiratory plasticity and response to augmentation. Limited views of the contralateral common femoral vein are unremarkable. OTHER Popliteal fossa demonstrates a 6.2 x 1.2 x 4.1 cm internally heterogeneous hypoechoic cystic lesion. Subcutaneus soft tissue edema of the leg. Limitations: none IMPRESSION: 1. No deep venous thrombosis of the left lower extremity. 2. Popliteal fossa demonstrates a 6.2 x 1.2 x 4.1 cm internally heterogeneous hypoechoic cystic lesion. Finding likely represents a liquified hematoma versus Baker cyst. 3. Subcutaneus soft tissue edema of the leg. Electronically Signed   By: Morgane  Naveau M.D.   On: 06/09/2024 18:21   DG Chest 2 View Result Date: 06/09/2024 CLINICAL DATA:  Chest pain since last week EXAM: CHEST - 2 VIEW COMPARISON:  03/26/2024 from [facility], without report. A chest CT of 04/13/2024 is also reviewed. FINDINGS: Hyperinflation. Right dialysis catheter tips poorly visualized, likely mid right atrium. Midline trachea. Normal heart size. Small left and trace right pleural effusions. No pneumothorax. Mild pulmonary venous congestion. Bibasilar airspace disease. IMPRESSION: 1. Small left and trace right pleural effusions with bibasilar airspace disease, favoring atelectasis. 2. Mild pulmonary venous congestion. Electronically Signed   By: Rockey Kilts M.D.   On: 06/09/2024 16:46     Medications:     ceFAZolin  (ANCEF ) IV     metronidazole      atorvastatin   40 mg Oral Daily  budesonide-glycopyrrolate-formoterol  2 puff Inhalation BID   [START ON 06/10/2024] calcitRIOL   0.25 mcg Oral Q T,Th,Sa-HD   Calcium  Acetate  1,334 mg Oral TID   [START ON 06/10/2024] Chlorhexidine  Gluconate Cloth  6 each Topical Q0600   [START ON 06/10/2024] Chlorhexidine  Gluconate Cloth  6 each Topical Q0600   diltiazem   300 mg Oral Daily   FLUoxetine   20 mg Oral Daily   furosemide   40 mg Intravenous Once   gabapentin   300  mg Oral Daily   hydrALAZINE   50 mg Oral TID   [START ON 06/10/2024] insulin  aspart  0-6 Units Subcutaneous TID WC   losartan   25 mg Oral Daily   metoprolol  succinate  25 mg Oral Daily   pantoprazole   40 mg Oral Daily    Assessment/ Plan:     ESRD on HD, volume overload. Unable to find an RN tonight to dialyze this patient for UFR. Will give lasix  and dialyze in AM.  On oxygen and is comfortable tonight as per admitting RN and MD.  Labs and medications reviewed. Will continue to follow along with you.   LOS: 0 Pinkey Edman, MD [facility] 7/18/20259:37 PM

## 2024-06-09 NOTE — H&P (Signed)
 History and Physical    Patient: [name] [name] FMW:[id number] DOB: 13-Nov-1971 DOA: 06/09/2024 DOS: the patient was seen and examined on 06/09/2024 PCP: Adele Song, MD  Patient coming from: Home  Chief Complaint:  Chief Complaint  Patient presents with   Chest Pain   Leg Pain   HPI: [name] [name] is a 53 y.o. 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  He presented to the emergency room with complaints of left lower extremity pain and erythema.  He also complained of right-sided chest discomfort and exertional dyspnea. He however denies any orthopnea or paroxysmal nocturnal dyspnea.  He was seen and assessed in the ED.  Workup in the ED revealed markedly elevated BNP from baseline.  CT scan did reveal some worsening bilateral pleural effusion.  Vascular ultrasound showed no deep vein thrombosis but left popliteal fossa demonstrates hypoechoic cystic lesion concerning for possible hematoma versus Baker's cyst.  Examination of the left lower extremity were concerning for early signs of cellulitis.  Patient has been referred to hospitalist service for admission and further treatment.  Patient admits to pain in left lower extremity.  Denies any fever or chills.  Denies nausea or vomiting. Review of Systems: As mentioned in the history of present illness. All other systems reviewed and are negative. Past Medical History:  Diagnosis Date   Abdominal pain 11/30/2023   Abdominal tenderness 10/14/2023   Allergy    seasonal and environmental   Anemia    hx blood transfusion 04/27/24   Chest pain 12/01/2023   Chronic constipation 05/13/2020   Depression    Diabetes mellitus without complication (HCC) 2019   Dyspnea    hx - SOB with exertion   ESRD on hemodialysis (HCC)    Tues, Thurs, Sat   GERD  (gastroesophageal reflux disease)    Hyperlipidemia    Hypertension    Necrotizing fasciitis (HCC) 10/13/2017   Neuromuscular disorder (HCC)    neuropathy   PAD (peripheral artery disease) (HCC)    Secondary hyperparathyroidism (HCC)    SVT (supraventricular tachycardia) (HCC)    hx   Wound dehiscence 11/24/2017   Past Surgical History:  Procedure Laterality Date   A/V FISTULAGRAM N/A 03/17/2024   Procedure: A/V Fistulagram;  Surgeon: Magda Debby SAILOR, MD;  Location: [facility] INVASIVE CV LAB;  Service: Cardiovascular;  Laterality: N/A;   AMPUTATION Right 10/13/2017   Procedure: RIGHT BELOW KNEE AMPUTATION;  Surgeon: Harden Jerona GAILS, MD;  Location: [facility] OR;  Service: Orthopedics;  Laterality: Right;   AMPUTATION Right 11/24/2017   Procedure: AMPUTATION BELOW KNEE REVISION;  Surgeon: Harden Jerona GAILS, MD;  Location: [facility] OR;  Service: Orthopedics;  Laterality: Right;   AMPUTATION TOE Left    AV FISTULA PLACEMENT Left 10/19/2023   Procedure: LEFT BRACHIOCEPHALIC ARTERIOVENOUS (AV) FISTULA CREATION;  Surgeon: Pearline Norman RAMAN, MD;  Location: [facility] OR;  Service: Vascular;  Laterality: Left;   AV FISTULA PLACEMENT Left 01/26/2024   Procedure: LEFT ARTERIOVENOUS (AV) FISTULA CREATION;  Surgeon: Pearline Norman RAMAN, MD;  Location: [facility] OR;  Service: Vascular;  Laterality: Left;   BASCILIC VEIN TRANSPOSITION Left 04/26/2024   Procedure: LEFT SECOND STAGE TRANSPOSITION, VEIN, BASILIC;  Surgeon: Magda Debby SAILOR, MD;  Location: [facility] OR;  Service: Vascular;  Laterality: Left;   BIOPSY  09/02/2021   Procedure: BIOPSY;  Surgeon: Federico Rosario BROCKS, MD;  Location: [facility] ENDOSCOPY;  Service: Gastroenterology;;   BIOPSY  02/18/2023   Procedure: BIOPSY;  Surgeon: Leigh Elspeth SQUIBB, MD;  Location: [facility] ENDOSCOPY;  Service: Gastroenterology;;   COLONOSCOPY  05/11/2019   COLONOSCOPY  2021   3 polyps - tubular adenomas   ESOPHAGOGASTRODUODENOSCOPY (EGD) WITH PROPOFOL  N/A 09/02/2021   Procedure: ESOPHAGOGASTRODUODENOSCOPY (EGD) WITH  PROPOFOL ;  Surgeon: Federico Rosario BROCKS, MD;  Location: [facility] ENDOSCOPY;  Service: Gastroenterology;  Laterality: N/A;   ESOPHAGOGASTRODUODENOSCOPY (EGD) WITH PROPOFOL  N/A 02/18/2023   Procedure: ESOPHAGOGASTRODUODENOSCOPY (EGD) WITH PROPOFOL ;  Surgeon: Leigh Elspeth SQUIBB, MD;  Location: [facility] ENDOSCOPY;  Service: Gastroenterology;  Laterality: N/A;   INCISION AND DRAINAGE OF WOUND Left 05/17/2024   Procedure: IRRIGATION AND DEBRIDEMENT WOUND;  Surgeon: Serene Gaile ORN, MD;  Location: [facility] OR;  Service: Vascular;  Laterality: Left;  Arm   INTRAMEDULLARY (IM) NAIL INTERTROCHANTERIC Right 10/15/2023   Procedure: INTRAMEDULLARY (IM) NAIL INTERTROCHANTERIC;  Surgeon: Kendal Franky SQUIBB, MD;  Location: [facility] OR;  Service: Orthopedics;  Laterality: Right;   IR FLUORO GUIDE CV LINE RIGHT  10/15/2023   IR US  GUIDE VASC ACCESS RIGHT  10/15/2023   POLYPECTOMY     WISDOM TOOTH EXTRACTION     Social History:  reports that he has been smoking cigarettes. He started smoking about 35 years ago. He has a 17.9 pack-year smoking history. He has never used smokeless tobacco. He reports that he does not drink alcohol and does not use drugs.  Allergies  Allergen Reactions   Iron  Nausea And Vomiting    With the iron  tablets   Tape Rash    Rash at site of tape--okay to use paper tape    Family History  Problem Relation Age of Onset   Diabetes Mellitus II Mother    Depression Mother    Colon polyps Mother    Hypertension Mother    High Cholesterol Mother    Heart disease Father        CABG x 4.  04/2017   Diabetes Father    High Cholesterol Father    Hypertension Father    Heart disease Maternal Grandmother    Heart disease Maternal Grandfather    Colon cancer Neg Hx    Esophageal cancer Neg Hx    Liver cancer Neg Hx    Pancreatic cancer Neg Hx    Rectal cancer Neg Hx    Stomach cancer Neg Hx     Prior to Admission medications   Medication Sig Start Date End Date Taking? Authorizing Provider  Accu-Chek Softclix  Lancets lancets Use to check blood sugar twice per day. E11.9 03/15/24   Christia Budds, MD  acetaminophen  (TYLENOL ) 500 MG tablet Take 2 tablets (1,000 mg total) by mouth every 6 (six) hours as needed. Patient not taking: Reported on 05/24/2024 10/26/23   Lonnie Earnest, MD  albuterol  (VENTOLIN  HFA) 108 (90 Base) MCG/ACT inhaler Inhale 2 puffs into the lungs every 6 (six) hours as needed for wheezing or shortness of breath. 03/31/24   Christia Budds, MD  atorvastatin  (LIPITOR) 40 MG tablet TAKE 1 TABLET(40 MG) BY MOUTH AT BEDTIME 04/12/24   Orlando Pond, DO  Blood Glucose Monitoring Suppl (ACCU-CHEK GUIDE ME) w/Device KIT See admin instructions. 02/23/23   [provider]  calcitRIOL  (ROCALTROL ) 0.25 MCG capsule Take 1 capsule (0.25 mcg total) by mouth Every Tuesday,Thursday,and Saturday with dialysis. 10/26/23   Baloch, Mahnoor, MD  Calcium  Acetate 667 MG TABS Take 1,334 mg by mouth 3 (three) times daily.    [provider]  diltiazem  (  CARDIZEM  CD) 300 MG 24 hr capsule TAKE 1 CAPSULE(300 MG) BY MOUTH DAILY 03/29/24   Christia Budds, MD  doxycycline  (VIBRAMYCIN ) 100 MG capsule Take 1 capsule (100 mg total) by mouth 2 (two) times daily for 14 days. 06/07/24 06/21/24  Bethanie Cough, PA-C  FLUoxetine  (PROZAC ) 20 MG capsule TAKE 1 CAPSULE(20 MG) BY MOUTH DAILY 05/01/24   Christia Budds, MD  fluticasone  (FLONASE ) 50 MCG/ACT nasal spray Place 2 sprays into both nostrils daily. 05/11/24   Christia Budds, MD  Fluticasone -Umeclidin-Vilant (TRELEGY ELLIPTA ) 100-62.5-25 MCG/ACT AEPB INHALE 1 PUFF INTO LUNGS DAILY 05/15/24   Christia Budds, MD  gabapentin  (NEURONTIN ) 300 MG capsule Take 1 capsule (300 mg total) by mouth daily. 06/01/24   Hindel, Rea, MD  glucose blood (AGAMATRIX PRESTO TEST) test strip Check blood sugars twice daily before meals 02/02/18   Adella Norris, MD  glucose blood test strip 1 each by Other route as needed for other. Use as instructed    [provider]   hydrALAZINE  (APRESOLINE ) 50 MG tablet Take 1 tablet (50 mg total) by mouth 3 (three) times daily. 05/11/24   Christia Budds, MD  HYDROcodone -acetaminophen  (NORCO/VICODIN) 5-325 MG tablet Take 1 tablet by mouth every 6 (six) hours as needed for up to 5 days for severe pain (pain score 7-10). 06/07/24 06/12/24  Lafe Domino, DO  insulin  lispro (HUMALOG ) 100 UNIT/ML injection 0-6 Units, Subcutaneous, 3 times daily with meals, First dose on Sun 10/17/23 at 0800 Correction coverage: Very Sensitive (ESRD/Dialysis) CBG < 70: Implement Hypoglycemia Standing Orders and refer to Hypoglycemia Standing Orders sidebar report CBG 70 - 120: 0 units CBG 121 - 150: 0 units CBG 151 - 200: 1 unit CBG 201-250: 2 units CBG 251-300: 3 units CBG 301-350: 4 units CBG 351-400: 5 units CBG > 400: Give 6 units and call MD Patient taking differently: Inject into the skin 2 (two) times daily. 0-6 Units, Subcutaneous, 3 times daily with meals, as needed  Correction coverage: Very Sensitive (ESRD/Dialysis) CBG < 70: Implement Hypoglycemia Standing Orders and refer to Hypoglycemia Standing Orders sidebar report CBG 70 - 120: 0 units CBG 121 - 150: 0 units CBG 151 - 200: 1 unit CBG 201-250: 2 units CBG 251-300: 3 units CBG 301-350: 4 units CBG 351-400: 5 units CBG > 400: Give 6 units and call MD 10/26/23   Lonnie Mahnoor, MD  lidocaine  (HM LIDOCAINE  PATCH) 4 % Place 1 patch onto the skin daily. 11/29/23   Christia Budds, MD  loratadine  (CLARITIN ) 10 MG tablet Take 1 tablet (10 mg total) by mouth daily. 05/24/24 05/24/25  Isadora Hose, MD  losartan  (COZAAR ) 25 MG tablet TAKE 1 TABLET(25 MG) BY MOUTH DAILY 05/17/24   Christia Budds, MD  metoprolol  succinate (TOPROL -XL) 25 MG 24 hr tablet Take 25 mg by mouth daily. 01/13/24   [provider]  ondansetron  (ZOFRAN -ODT) 4 MG disintegrating tablet Take 1 tablet (4 mg total) by mouth every 8 (eight) hours as needed for nausea or vomiting. 12/12/23   Christia Budds, MD   pantoprazole  (PROTONIX ) 40 MG tablet TAKE 1 TABLET(40 MG) BY MOUTH DAILY 05/11/24   Jagadish, Mayuri, MD  polyethylene glycol (MIRALAX  / GLYCOLAX ) 17 g packet Take 17 g by mouth daily. Patient not taking: Reported on 05/24/2024 04/06/24   Legrand Victory LITTIE DOUGLAS, MD  sevelamer  carbonate (RENVELA ) 800 MG tablet Take 2 tablets (1,600 mg total) by mouth 3 (three) times daily with meals. Patient not taking: Reported on 05/24/2024 10/26/23   Lonnie Earnest, MD  tamsulosin  (FLOMAX ) 0.4 MG CAPS capsule TAKE 1 CAPSULE(0.4 MG) BY MOUTH DAILY AFTER SUPPER Patient not taking: Reported on 05/24/2024 05/11/24   Christia Budds, MD  tiZANidine  (ZANAFLEX ) 2 MG tablet TAKE 1 TABLET(2 MG) BY MOUTH TWICE DAILY AS NEEDED FOR MUSCLE SPASMS 05/15/24   Christia Budds, MD    Physical Exam: Vitals:   06/09/24 1555 06/09/24 1556 06/09/24 1700  BP: (!) 174/90  (!) 186/90  Pulse: (!) 101  (!) 108  Resp: 17  18  Temp: 98.9 F (37.2 C)    TempSrc: Oral    SpO2: 92%  96%  Weight:  111.1 kg   Height:  6' 5 (1.956 m)    General: Patient is an obese gentleman who is laying comfortably in bed not in any acute distress. HEENT: Head is atraumatic, oral mucosa moist.  EOMI Neck: Supple with no JVD Chest: Clinically diminished.  No use of accessory muscles.  Right upper chest catheter in place.  Surrounding sites looks clean and dry Cardiovascular: Regular S1-S2.  No murmurs appreciated. Extremities: Significant for right BKA with prosthesis in place.  Left lower extremity significant for wrinkled left lower extremity.  Erythematous skin over legs.  No open ulcers appreciated however.  Tenderness around the calf.  Left big toe amputation, stump completely healed. CNS shows no obvious focal deficit.  Data Reviewed: Sodium 138, potassium 4.1, chloride 101, bicarb 23, glucose 285, BUN 35, creatinine 6.69, calcium  8.3, BNP greater than 4500, troponin 40, WBC 8.4, hemoglobin 8.7, hematocrit 29.3, MCV 79, platelet count 154.  Glucose  285.  CT angiogram ruled out pulm emboli.  Interval increase in bilateral moderate volume pleural effusions were noted.  Mediastinal lymph nodes and splenomegaly were also seen.  Two-vessel coronary artery calcification were noted.  Duplex ultrasound was significant for no evidence of DVT.  Popliteal fossa demonstrates a 6.2 x 1.2 x 4.1 cm hypoechoic cystic lesion.  Findings suggest Baker's cyst versus hematoma.  Subcutaneous soft tissue edema of the leg was also noted.  Assessment and Plan: 53 year old chronically ill appearing gentleman with end-stage renal disease, hypertension, diabetes mellitus, peripheral arterial disease who presents on account of left lower extremity pain and swelling and also complains of chest pain and shortness of breath.  CT chest concerning for increased pleural effusion.  Left lower extremity significant for cellulitis.  #1.Acute left lower extremity cellulitis: Duplex ultrasound ruled out DVT.  Popliteal fossa of left lower extremity demonstrates a 6.2 x 1.2 x 4.1 cm hypoechoic cystic lesion.  Findings concerning for Baker's cyst versus a liquefied hematoma.  Patient will be empirically covered with IV antibiotics to cover for skin flora.  Cultures will be sent.  Anticipate possible switch to p.o. antibiotics upon discharge.  #2.  End-stage renal disease with mild volume overload.  Patient admits compliance with dialysis.  He is scheduled for hemodialysis on Saturday.  Nephrology was consulted from the ED.  Recommended 80 mg IV Lasix  x 1.  Plan is to dialyze patient in AM.  No signs of imminent respiratory failure at This time.  #3.  Type 2 diabetes mellitus: Fingerstick glucose monitoring per protocol.  Insulin  sliding scale in place.  #4.  Peripheral arterial disease status post right BKA  #5.  Resistant hypertension: Blood pressure not at goal.  Blood pressure medications will be resumed.  IV hydralazine  as needed for SBP greater than 180.  #6.  Recent history of  AV fistula with wound dehiscence.  Seen and evaluated by vascular surgery a few  days prior.  No associated findings.  #7.  Incidental finding of two-vessel coronary artery calcifications were noted on CT scan.   #8. VTE prophylaxis with SCD- due to concern for liquefied hematoma.    Advance Care Planning:   Code Status: Full Code  Consults: Nephrology/ consider Podiatry consult  Family Communication: None  Severity of Illness: The appropriate patient status for this patient is INPATIENT. Inpatient status is judged to be reasonable and necessary in order to provide the required intensity of service to ensure the patient's safety. The patient's presenting symptoms, physical exam findings, and initial radiographic and laboratory data in the context of their chronic comorbidities is felt to place them at high risk for further clinical deterioration. Furthermore, it is not anticipated that the patient will be medically stable for discharge from the hospital within 2 midnights of admission.   * I certify that at the point of admission it is my clinical judgment that the patient will require inpatient hospital care spanning beyond 2 midnights from the point of admission due to high intensity of service, high risk for further deterioration and high frequency of surveillance required.*  Author: Maude MARLA Dart, MD 06/09/2024 7:18 PM  For on call review [email or web address].

## 2024-06-09 NOTE — Progress Notes (Signed)
 PHARMACY NOTE:  ANTIMICROBIAL RENAL DOSAGE ADJUSTMENT  Current antimicrobial regimen includes a mismatch between antimicrobial dosage and estimated renal function.  As per policy approved by the Pharmacy & Therapeutics and Medical Executive Committees, the antimicrobial dosage will be adjusted accordingly.  Current antimicrobial dosage:  cefazolin  2 gm IV q8h  Indication: cellulitis  Renal Function:  Estimated Creatinine Clearance: 18.1 mL/min (A) (by C-G formula based on SCr of 6.69 mg/dL (H)). [x]      On intermittent HD, scheduled: TTS  Antimicrobial dosage has been changed to:   Cefazolin  1 gm IV q24h (after HD on HD days)  Additional comments:   Thank you for allowing pharmacy to be a part of this patient's care.  Allean Haas PharmD Clinical Pharmacist 06/09/2024

## 2024-06-09 NOTE — ED Provider Notes (Signed)
 [name] [name] Provider Note    Event Date/Time   First MD Initiated Contact with Patient 06/09/24 1654     (approximate)   History   Chest Pain and Leg Pain   HPI  [name] [name] is a 53 y.o. male with history of diabetes, ESRD on dialysis, presenting with chest pain for the past week.  States that he has been having shortness of breath for the past 2 weeks.  No fevers or cough.  Did note left lower extremity swelling as well as some redness to his left medial calf for the last 3 days.  States that he completed his full dialysis session yesterday.  He denies any new weakness or numbness.  No falls.  States that he is compliant with medications.  Is still able to void spontaneously.  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  Had onychomycosis to the left toe, nails were mechanically debrided.  The note was done yesterday but patient states that he only saw podiatry 3 weeks ago.     Physical Exam   Triage Vital Signs: ED Triage Vitals  Encounter Vitals Group     BP 06/09/24 1555 (!) 174/90     Girls Systolic BP Percentile --      Girls Diastolic BP Percentile --      Boys Systolic BP Percentile --      Boys Diastolic BP Percentile --      Pulse Rate 06/09/24 1555 (!) 101     Resp 06/09/24 1555 17     Temp 06/09/24 1555 98.9 F (37.2 C)     Temp Source 06/09/24 1555 Oral     SpO2 06/09/24 1555 92 %     Weight 06/09/24 1556 245 lb (111.1 kg)     Height 06/09/24 1556 6' 5 (1.956 m)     Head Circumference --      Peak Flow --      Pain Score 06/09/24 1556 8     Pain Loc --      Pain Education --      Exclude from Growth Chart --     Most recent vital signs: Vitals:   06/09/24 1555 06/09/24 1700  BP: (!) 174/90 (!) 186/90  Pulse: (!) 101 (!) 108  Resp: 17 18  Temp: 98.9 F (37.2 C)   SpO2: 92% 96%      General: Awake, no distress.  CV:  Good peripheral perfusion.  Resp:  Normal effort.  Soft crackles at the right base, will get tachypneic to the 30s whenever he is moving slightly. Abd:  No distention.  Soft nontender Other:  He has a right BKA, left lower extremity is edematous, able to palpate DP pulses, no open wounds noted to the left lower extremity, there is a small area of erythema at his medial calf that does not have any palpable fluctuance, no pain or proportion, no crepitus.   ED Results / Procedures / Treatments   Labs (all labs ordered are listed, but only abnormal results are displayed) Labs Reviewed  BASIC METABOLIC PANEL WITH GFR - Abnormal; Notable for the following components:      Result Value   Glucose, Bld 285 (*)    BUN 35 (*)    Creatinine, Ser 6.69 (*)    Calcium  8.3 (*)  GFR, Estimated 9 (*)    All other components within normal limits  CBC - Abnormal; Notable for the following components:   RBC 3.72 (*)    Hemoglobin 8.7 (*)    HCT 29.3 (*)    MCV 78.8 (*)    MCH 23.4 (*)    MCHC 29.7 (*)    RDW 19.3 (*)    All other components within normal limits  BRAIN NATRIURETIC PEPTIDE - Abnormal; Notable for the following components:   B Natriuretic Peptide >4,500.0 (*)    All other components within normal limits  TROPONIN I (HIGH SENSITIVITY) - Abnormal; Notable for the following components:   Troponin I (High Sensitivity) 40 (*)    All other components within normal limits  TROPONIN I (HIGH SENSITIVITY)     EKG  EKG shows, sinus tachycardia, rate 106, normal QRS, normal QTc, T wave inversion to 1, aVL, no obvious ischemic ST elevation, T wave changes are new compared to prior   RADIOLOGY On my independent interpretation, CT PE study did not show any obvious PEs but did showed bilateral pleural effusions   PROCEDURES:  Critical Care performed: No  Procedures   MEDICATIONS ORDERED IN ED: Medications  doxycycline  (VIBRAMYCIN ) 100 mg  in sodium chloride  0.9 % 250 mL IVPB (has no administration in time range)  furosemide  (LASIX ) injection 40 mg (has no administration in time range)  furosemide  (LASIX ) injection 40 mg (40 mg Intravenous Given 06/09/24 1728)  cefTRIAXone (ROCEPHIN) 1 g in sodium chloride  0.9 % 100 mL IVPB (1 g Intravenous New Bag/Given 06/09/24 1732)  iohexol  (OMNIPAQUE ) 350 MG/ML injection 75 mL (75 mLs Intravenous Contrast Given 06/09/24 1805)     IMPRESSION / MDM / ASSESSMENT AND PLAN / ED COURSE  I reviewed the triage vital signs and the nursing notes.                              Differential diagnosis includes, but is not limited to, CHF exacerbation, volume overload, renal dysfunction, pulmonary edema, ACS, also considered PE, DVT.  The redness to his calf could be early cellulitis.  Will start him on doxycycline  and ceftriaxone.  Will also give him some Lasix  here given that he is able to void a little.  He will likely need to be admitted for additional hemodialysis.  Patient's presentation is most consistent with acute presentation with potential threat to life or bodily function.  Independent interpretation of labs and imaging below.  Given volume overload, leg swelling, tachypnea and shortness of breath, he will need to be admitted for dialysis.  Consulted nephrology who wanted additional IV Lasix  given to him and they will dialyze him tomorrow morning.  Consulted hospitalist was agreeable with plan for admission and will evaluate the patient.  He is admitted.  The patient is on the cardiac monitor to evaluate for evidence of arrhythmia and/or significant heart rate changes.   Clinical Course as of 06/09/24 1850  Fri Jun 09, 2024  1704 DG Chest 2 View IMPRESSION: 1. Small left and trace right pleural effusions with bibasilar airspace disease, favoring atelectasis. 2. Mild pulmonary venous congestion.   [TT]  1824 Independent review of labs, no leukocytosis, creatinine is elevated but he has  history of ESRD on dialysis, electrolytes also deranged, BNP is quite elevated, troponin is mildly elevated. [TT]  1824 US  Venous Img Lower Unilateral Left IMPRESSION: 1. No deep venous thrombosis of the left lower extremity. 2.  Popliteal fossa demonstrates a 6.2 x 1.2 x 4.1 cm internally heterogeneous hypoechoic cystic lesion. Finding likely represents a liquified hematoma versus Baker cyst. 3. Subcutaneus soft tissue edema of the leg.   [TT]  [id number] Consult nephrology for additional dialysis session, recommended giving an additional 40 mg of Lasix  to make it 80 total, will dialyze him in morning tomorrow. [TT]  1849 CT Angio Chest PE W/Cm &/Or Wo Cm IMPRESSION: 1. No central or segmental pulmonary embolus. Limited evaluation more distally due to timing of contrast. 2. Interval increase of bilateral small to moderate volume pleural effusions. 3. Persistent prominent/borderline enlarged mediastinal lymph nodes that may be reactive in etiology. Recommend attention on follow-up. 4. Splenomegaly. 5. Interval increase in vertebral body height loss at the L1 superior endplate. Correlate with point tenderness to palpation to evaluate for an acute component. 6. At least 2 vessel coronary artery calcification.   [TT]    Clinical Course User Index [TT] Waymond Lorelle Cummins, MD     FINAL CLINICAL IMPRESSION(S) / ED DIAGNOSES   Final diagnoses:  Chest pain, unspecified type  Leg swelling  Shortness of breath  Hypervolemia, unspecified hypervolemia type     Rx / DC Orders   ED Discharge Orders     None        Note:  This document was prepared using Dragon voice recognition software and may include unintentional dictation errors.    Waymond Lorelle Cummins, MD 06/09/24 [phone]

## 2024-06-09 NOTE — ED Triage Notes (Signed)
 Patient to ED via POV for right side CP- ongoing since last week. States it started after having a cyst removed on rectum and right knee. Denies cardiac hx. Also having left leg pain/redness x3 days. Pt noted to have large medical hx. Dialysis pt- last full tx yesterday.

## 2024-06-09 NOTE — Progress Notes (Signed)
 Pt has read message below.

## 2024-06-10 ENCOUNTER — Other Ambulatory Visit

## 2024-06-10 DIAGNOSIS — L03116 Cellulitis of left lower limb: Secondary | ICD-10-CM

## 2024-06-10 LAB — GLUCOSE, CAPILLARY
Glucose-Capillary: 205 mg/dL — ABNORMAL HIGH (ref 70–99)
Glucose-Capillary: 206 mg/dL — ABNORMAL HIGH (ref 70–99)
Glucose-Capillary: 132 mg/dL — ABNORMAL HIGH (ref 70–99)
Glucose-Capillary: 167 mg/dL — ABNORMAL HIGH (ref 70–99)
Glucose-Capillary: 214 mg/dL — ABNORMAL HIGH (ref 70–99)

## 2024-06-10 LAB — CBC
HCT: 27.6 % — ABNORMAL LOW (ref 39.0–52.0)
Hemoglobin: 8 g/dL — ABNORMAL LOW (ref 13.0–17.0)
MCH: 22.8 pg — ABNORMAL LOW (ref 26.0–34.0)
MCHC: 29 g/dL — ABNORMAL LOW (ref 30.0–36.0)
MCV: 78.6 fL — ABNORMAL LOW (ref 80.0–100.0)
Platelets: 149 K/uL — ABNORMAL LOW (ref 150–400)
RBC: 3.51 MIL/uL — ABNORMAL LOW (ref 4.22–5.81)
RDW: 19.2 % — ABNORMAL HIGH (ref 11.5–15.5)
WBC: 9.7 K/uL (ref 4.0–10.5)
nRBC: 0 % (ref 0.0–0.2)

## 2024-06-10 LAB — BASIC METABOLIC PANEL WITH GFR
Anion gap: 9 (ref 5–15)
BUN: 43 mg/dL — ABNORMAL HIGH (ref 6–20)
CO2: 24 mmol/L (ref 22–32)
Calcium: 7.6 mg/dL — ABNORMAL LOW (ref 8.9–10.3)
Chloride: 105 mmol/L (ref 98–111)
Creatinine, Ser: 7.57 mg/dL — ABNORMAL HIGH (ref 0.61–1.24)
GFR, Estimated: 8 mL/min — ABNORMAL LOW (ref >60)
Glucose, Bld: 195 mg/dL — ABNORMAL HIGH (ref 70–99)
Potassium: 4.5 mmol/L (ref 3.5–5.1)
Sodium: 138 mmol/L (ref 135–145)

## 2024-06-10 LAB — HEMOGLOBIN A1C
Hgb A1c MFr Bld: 7.3 % — ABNORMAL HIGH (ref 4.8–5.6)
Mean Plasma Glucose: 162.81 mg/dL

## 2024-06-10 LAB — HEPATITIS B SURFACE ANTIGEN: Hepatitis B Surface Ag: NONREACTIVE

## 2024-06-10 MED ORDER — NICOTINE 14 MG/24HR TD PT24: 14.0000 mg | MEDICATED_PATCH | Freq: Every day | TRANSDERMAL | Status: DC

## 2024-06-10 MED ORDER — INSULIN PUMP
Freq: Three times a day (TID) | SUBCUTANEOUS | Status: DC
  Administered 2024-06-10: 1 via SUBCUTANEOUS
  Filled 2024-06-10: qty 1

## 2024-06-10 MED ORDER — INSULIN GLARGINE-YFGN 100 UNIT/ML ~~LOC~~ SOPN
22.0000 [IU] | PEN_INJECTOR | Freq: Every morning | SUBCUTANEOUS | Status: DC
  Filled 2024-06-10: qty 3

## 2024-06-10 MED ORDER — INSULIN ASPART 100 UNIT/ML IJ SOLN
0.0000 [IU] | Freq: Three times a day (TID) | INTRAMUSCULAR | Status: DC
  Administered 2024-06-10: 1 [IU] via SUBCUTANEOUS
  Administered 2024-06-11 (×2): 2 [IU] via SUBCUTANEOUS
  Filled 2024-06-10 (×3): qty 1

## 2024-06-10 MED ORDER — INSULIN GLARGINE-YFGN 100 UNIT/ML ~~LOC~~ SOLN: 22.0000 [IU] | Freq: Every morning | SUBCUTANEOUS | Status: DC

## 2024-06-10 MED ORDER — CHLORHEXIDINE GLUCONATE CLOTH 2 % EX PADS
6.0000 | MEDICATED_PAD | Freq: Every day | CUTANEOUS | Status: DC
  Administered 2024-06-11: 6 via TOPICAL

## 2024-06-10 MED ORDER — LORATADINE 10 MG PO TABS
10.0000 mg | ORAL_TABLET | Freq: Every day | ORAL | Status: DC
  Administered 2024-06-11: 10 mg via ORAL
  Filled 2024-06-10: qty 1

## 2024-06-10 MED ORDER — SEVELAMER CARBONATE 800 MG PO TABS: 1600.0000 mg | ORAL_TABLET | Freq: Three times a day (TID) | ORAL | Status: DC

## 2024-06-10 MED ORDER — METHOCARBAMOL 500 MG PO TABS
1000.0000 mg | ORAL_TABLET | Freq: Three times a day (TID) | ORAL | Status: DC | PRN
  Administered 2024-06-11 (×2): 1000 mg via ORAL
  Filled 2024-06-10 (×2): qty 2

## 2024-06-10 MED ORDER — HYDRALAZINE HCL 50 MG PO TABS
75.0000 mg | ORAL_TABLET | Freq: Three times a day (TID) | ORAL | Status: DC
  Administered 2024-06-10 – 2024-06-11 (×2): 75 mg via ORAL
  Filled 2024-06-10 (×2): qty 1

## 2024-06-10 NOTE — Progress Notes (Addendum)
 Progress Note   Patient: [name] [name] FMW:[id number] DOB: [birth year] DOA: 06/09/2024     1 DOS: the patient was seen and examined on 06/10/2024   Brief hospital course: 53yo with h/o ESRD on HD, DM, HTN, and PAD s/p R BKA and L great toe amputation who presented on 7/18 with chest and leg pain as well as DOE.  He was found to have a markedly elevated BNP with CT showing worsening B pleural effusion, likely from volume overload.  DVT US  negative for DVT but with L popliteal fossa hypoechoic lesion concerning for hematoma vs. Baker's cyst and physical exam was concerning for cellulitis.  He was started on antibiotics and planned for HD.  Assessment and Plan:  Acute left lower extremity cellulitis Presented with multiple complaints, including LLE pain with erythema Appears to have been started on PO doxycycline  on 7/16 Patient will be empirically covered with IV antibiotics to cover for skin flora Area of erythema appears to be improving based on markings Continue IV antibiotics today, likely transition to PO tomorrow and dc to home  Left thigh spasm Patient reports constant posterior L thigh pain x 3 days Duplex ultrasound ruled out DVT but popliteal fossa of left lower extremity demonstrates a 6.2 x 1.2 x 4.1 cm hypoechoic cystic lesion concerning for Baker's cyst versus a liquefied hematoma Will reach out to orthopedics to see if US  indicates a drainable collection - Dr. Mardee does not recommend, as these cysts will generally recur after aspiration Will ensure that he has muscle relaxant ordered  Right-sided CP Elevated BNP + CXR indicate volume overload in the setting of ESRD Reports compliance with HD Seen in HD this AM with no change to symptoms Troponin mildly elevated, likely from renal disease; negative delta, low suspicion for ACS   End-stage renal disease  On TTS HD, has not missed or shortened a session On RA Undergoing HD this AM Given 80 mg IV Lasix  x 1 Continue  calcitriol , Phoslo , renvela    Type 2 diabetes mellitus Reports having an insulin  pump but RN reports that he is not giving himself any insulin  A1c 7.3, reasonable control Stop pump Cover with very sensitive-scale SSI Carb modified diet  Continue gabapentin    Peripheral arterial disease status post right BKA BKA stump is C/D/I, has prosthetic at bedside Continue atorvastatin   Resistant hypertension Blood pressure not at goal Resume home BP medications including hydralazine , diltiazem , losartan  He has not been taking Toprol  XL but this was resumed at time of admission IV hydralazine  as needed for SBP greater than 180  COPD Stop smoking  Continue Trelegy (Breztri  per formulary) Nicotine  patch ordered  Depression Continue fluoxetine    Recent history of AV fistula with wound dehiscence Seen and evaluated by vascular surgery on 7/16 Seroma evacuated Needs wound care f/u this coming week Smoking cessation encouraged   Incidental finding of two-vessel coronary artery calcifications  Noted on CT scan Monitor for development of symptoms concerning for ACS Followed by vascular surgery and PCP as outpatient     Consultants: Nephrology  Procedures: DVT US  7/18 HD 7/19  Antibiotics: Cefazolin  7/18- Metronidazole  7/18-  30 Day Unplanned Readmission Risk Score    Flowsheet Row ED to Hosp-Admission (Current) from 06/09/2024 in [facility]  30 Day Unplanned Readmission Risk Score (%) 29.3 Filed at 06/10/2024 0800    This score is the patient's risk of an unplanned readmission within 30 days of being discharged (0 -100%). The score is based on  dignosis, age, lab data, medications, orders, and past utilization.   Low:  0-14.9   Medium: 15-21.9   High: 22-29.9   Extreme: 30 and above           Subjective: Seen in HD.  Reports R lower chest pain, L posterior thigh spasm.  LLE erythema appeared improved but he wasn't  convinced.   Objective: Vitals:   06/10/24 1223 06/10/24 1305  BP: (!) 186/99 (!) 188/106  Pulse: 84 88  Resp: 20 18  Temp: 98.1 F (36.7 C) 97.9 F (36.6 C)  SpO2: 98% 97%    Intake/Output Summary (Last 24 hours) at 06/10/2024 1455 Last data filed at 06/10/2024 1223 Gross per 24 hour  Intake 730 ml  Output 272 ml  Net 458 ml   Filed Weights   06/09/24 1556 06/10/24 0854 06/10/24 1239  Weight: 111.1 kg 111.9 kg 106.3 kg    Exam:  General:  Appears calm and comfortable and is in NAD, on RA Eyes:   normal lids, iris ENT:  grossly normal hearing, lips & tongue, mmm Cardiovascular:  RRR. No LE edema.  Respiratory:   CTA bilaterally with no wheezes/rales/rhonchi.  Normal respiratory effort. Abdomen:  soft, NT, ND Skin:  L lower leg with improving erythema based on markings, overall reassuring condition Musculoskeletal:  s/p R BKA; patient was able to extend his left leg and the posterior thigh was unremarkable in appearance without erythema Psychiatric:  blunted mood and affect, speech fluent and appropriate, AOx3 Neurologic:  CN 2-12 grossly intact, moves all extremities in coordinated fashion  Data Reviewed: I have reviewed the patient's lab results since admission.  Pertinent labs for today include:   Glucose 195 BUN 43/Creatinine 7.57/GFR 8 BNP >4500 HS troponin 40, 39 WBC 9.7 Hgb 8, down from 8.7 A1c 7.4 Blood cultures NTD    Family Communication: None present  Disposition: Status is: Inpatient Remains inpatient appropriate because: ongoing management; anticipate dc to home 7/20 if he continues to improve     Time spent: 50 minutes  Unresulted Labs (From admission, onward)     Start     Ordered   06/11/24 0500  CBC with Differential/Platelet  Tomorrow morning,   R        06/10/24 1453   06/11/24 0500  Basic metabolic panel with GFR  Tomorrow morning,   R        06/10/24 1453   06/09/24 1925  Hepatitis B surface antibody,quantitative  (New  Admission Hemo Labs (Hepatitis B))  Once,   R        06/09/24 1925             Author: Delon Herald, MD 06/10/2024 2:55 PM  For on call review [email or web address].

## 2024-06-10 NOTE — Progress Notes (Signed)
  Received patient in bed to unit.   Informed consent signed and in chart.    TX duration:3.0     Transported by  Hand-off given to patient's nurse. Pt tolerated tx well.   Access used: Cath Access issues: none    Total UF removed: 3.5 liters Medication(s) given: Norco Post HD VS: wnl Post HD weight: 106.3 kg     N. Javone Ybanez LPN Kidney Dialysis Unit

## 2024-06-10 NOTE — Hospital Course (Signed)
 53yo with h/o ESRD on HD, DM, HTN, and PAD s/p R BKA and L great toe amputation who presented on 7/18 with chest and leg pain as well as DOE.  He was found to have a markedly elevated BNP with CT showing worsening B pleural effusion, likely from volume overload.  DVT US  negative for DVT but with L popliteal fossa hypoechoic lesion concerning for hematoma vs. Baker's cyst and physical exam was concerning for cellulitis.  He was started on antibiotics and planned for HD.

## 2024-06-10 NOTE — Evaluation (Signed)
 Occupational Therapy Evaluation Patient Details Name: [name] [name] MRN: [id number] DOB: [birth year] Today's Date: 06/10/2024   History of Present Illness   Pt is a 53 y.o. male who presented to the ER with complaints of LLE pain and erythema.  He also complained of right-sided chest discomfort and exertional dyspnea. MD assessment includes: Acute LLE cellulitis, ESRD with mild volume overload. PMH of end-stage renal disease, diabetes mellitus on insulin , hypertension, history of necrotizing fasciitis, peripheral arterial disease s/p right BKA and left big toe amputation.     Clinical Impressions Pt was seen for OT evaluation this date. PTA, pt resides with his mother and sister in a one level home with 3 STE with bil HR. He reports he is MOD I with all ADL/IADLs and mobility using a rollator. He is MOD I with wound vac management and RLE prosthesis management. Denies falls.   Pt presents to acute OT demonstrating no functional decline or impairment in ADL performance or functional mobility. He demo bed mobility, STS and ambulation ~180 ft around the unit using rollator with no LOB and steady gait. Pt reports he is at his baseline with no acute therapy needs and only report tightness to his LLE that he says is not affecting his balance. He demo all aspects of toileting with MOD I/distant supervision as well as managed his wound vac and RLE prosthetic without assist. OT to sign off in house with no further needs on DC.     If plan is discharge home, recommend the following:         Functional Status Assessment   Patient has not had a recent decline in their functional status     Equipment Recommendations   None recommended by OT     Recommendations for Other Services         Precautions/Restrictions   Precautions Precautions: None Restrictions Weight Bearing Restrictions Per Provider Order: No     Mobility Bed Mobility Overal bed mobility: Modified Independent                   Transfers Overall transfer level: Modified independent Equipment used: Rollator (4 wheels)               General transfer comment: utilized personal rollator to ambulate in the room and additional 170 ft around the unit with MOD I      Balance Overall balance assessment: Modified Independent                                         ADL either performed or assessed with clinical judgement   ADL Overall ADL's : Modified independent                                       General ADL Comments: able to don RLE prosthetic and perform all aspects of toileting with MOD I     Vision         Perception         Praxis         Pertinent Vitals/Pain Pain Assessment Pain Assessment: 0-10 Pain Score: 8  Pain Location: R knee Pain Descriptors / Indicators: Aching Pain Intervention(s): Monitored during session, Limited activity within patient's tolerance, Repositioned     Extremity/Trunk Assessment Upper Extremity Assessment Upper Extremity Assessment:  Overall WFL for tasks assessed   Lower Extremity Assessment Lower Extremity Assessment: Overall WFL for tasks assessed       Communication Communication Communication: No apparent difficulties   Cognition Arousal: Alert Behavior During Therapy: WFL for tasks assessed/performed Cognition: No apparent impairments                               Following commands: Intact       Cueing  General Comments      only report is tightness to his LLE from cellulitis; adamant he has no therapy needs   Exercises Other Exercises Other Exercises: Edu on role of OT in acute setting. Pt adamant he has no acute therapy needs and will sign off in house after he demo functional mobility and ADLs at MOD I level.   Shoulder Instructions      Home Living Family/patient expects to be discharged to:: Private residence Living Arrangements: Parent;Other relatives  (sister) Available Help at Discharge: Family;Available 24 hours/day Type of Home: House Home Access: Stairs to enter [employer] of Steps: 3 Entrance Stairs-Rails: Left;Right;Can reach both Home Layout: One level     Bathroom Shower/Tub: Tub/shower unit;Sponge bathes at baseline   [employer]: Handicapped height     Home Equipment: Wheelchair - Forensic psychologist (2 wheels);Grab bars - tub/shower;Hand held shower head;Cane - quad;BSC/3in1;Rollator (4 wheels)          Prior Functioning/Environment Prior Level of Function : Driving;Independent/Modified Independent             Mobility Comments: rollator, prosthetic, sits in W/C, but ambulates mod I; no recent falls ADLs Comments: IND with ADL/IADLs, driving    OT Problem List:     OT Treatment/Interventions:        OT Goals(Current goals can be found in the care plan section)       OT Frequency:       Co-evaluation              AM-PAC OT 6 Clicks Daily Activity     Outcome Measure Help from another person eating meals?: None Help from another person taking care of personal grooming?: None Help from another person toileting, which includes using toliet, bedpan, or urinal?: None Help from another person bathing (including washing, rinsing, drying)?: None Help from another person to put on and taking off regular upper body clothing?: None Help from another person to put on and taking off regular lower body clothing?: None 6 Click Score: 24   End of Session Equipment Utilized During Treatment: Rollator (4 wheels) (RLE prosthetic and wound vac) Nurse Communication: Mobility status  Activity Tolerance: Patient tolerated treatment well Patient left: with call bell/phone within reach (on toilet)  OT Visit Diagnosis: Other abnormalities of gait and mobility (R26.89)                Time: 9242-9179 OT Time Calculation (min): 23 min Charges:  OT General Charges $OT Visit: 1 Visit OT  Evaluation $OT Eval Low Complexity: 1 Low OT Treatments $Self Care/Home Management : 8-22 mins [name] [name], OTR/L  06/10/24, 11:43 AM  [name] [name] 06/10/2024, 11:41 AM

## 2024-06-10 NOTE — Progress Notes (Signed)
 PT Cancellation Note  Patient Details Name: [name] [name] MRN: [id number] DOB: 1971-10-05   Cancelled Treatment:    Reason Eval/Treat Not Completed: PT screened, no needs identified, will sign off (Pt AMB well with OT, reports no acute concerns with mobility at this time, just tightness from acute swelling. No acute PT needs indicated at this time. Will sign off.)  9:20 AM, 06/10/24 Peggye JAYSON Linear, PT, DPT Physical Therapist - [facility]  [phone] (ASCOM)    Nayan Proch C 06/10/2024, 9:20 AM

## 2024-06-10 NOTE — Plan of Care (Signed)
  Problem: Coping: Goal: Ability to adjust to condition or change in health will improve Outcome: Progressing   Problem: Fluid Volume: Goal: Ability to maintain a balanced intake and output will improve Outcome: Progressing   Problem: Health Behavior/Discharge Planning: Goal: Ability to manage health-related needs will improve Outcome: Progressing   Problem: Metabolic: Goal: Ability to maintain appropriate glucose levels will improve Outcome: Progressing   Problem: Nutritional: Goal: Maintenance of adequate nutrition will improve Outcome: Progressing Goal: Progress toward achieving an optimal weight will improve Outcome: Progressing   Problem: Tissue Perfusion: Goal: Adequacy of tissue perfusion will improve Outcome: Progressing   Problem: Clinical Measurements: Goal: Ability to maintain clinical measurements within normal limits will improve Outcome: Progressing Goal: Will remain free from infection Outcome: Progressing Goal: Diagnostic test results will improve Outcome: Progressing   Problem: Activity: Goal: Risk for activity intolerance will decrease Outcome: Progressing   Problem: Coping: Goal: Level of anxiety will decrease Outcome: Progressing   Problem: Elimination: Goal: Will not experience complications related to urinary retention Outcome: Progressing   Problem: Pain Managment: Goal: General experience of comfort will improve and/or be controlled Outcome: Progressing   Problem: Safety: Goal: Ability to remain free from injury will improve Outcome: Progressing   Problem: Skin Integrity: Goal: Risk for impaired skin integrity will decrease Outcome: Progressing   Problem: Clinical Measurements: Goal: Ability to avoid or minimize complications of infection will improve Outcome: Progressing   Problem: Skin Integrity: Goal: Skin integrity will improve Outcome: Progressing

## 2024-06-10 NOTE — Progress Notes (Signed)
 [facility]  PROGRESS NOTE   Subjective:   Patient seen on dialysis.  Feels much better this morning. He is tolerating dialysis via permacath. Patient is advised on the importance of fluid restriction.  Objective:  Vital signs: Blood pressure (!) 172/94, pulse 81, temperature 98.1 F (36.7 C), temperature source Oral, resp. rate 20, height 6' 5 (1.956 m), weight 111.9 kg, SpO2 98%.  Intake/Output Summary (Last 24 hours) at 06/10/2024 0954 Last data filed at 06/10/2024 0553 Gross per 24 hour  Intake 730 ml  Output 200 ml  Net 530 ml   Filed Weights   06/09/24 1556 06/10/24 0854  Weight: 111.1 kg 111.9 kg     Physical Exam: General:  No acute distress  Head:  Normocephalic, atraumatic. Moist oral mucosal membranes  Eyes:  Anicteric  Neck:  Supple  Lungs:   Clear to auscultation, normal effort  Heart:  S1S2 no rubs  Abdomen:   Soft, nontender, bowel sounds present  Extremities: Status post right BKA  .  Neurologic:  Awake, alert, following commands  Skin:  No lesions  Access:     Basic Metabolic Panel: Recent Labs  Lab 06/09/24 1559 06/10/24 0420  NA 138 138  K 4.1 4.5  CL 101 105  CO2 23 24  GLUCOSE 285* 195*  BUN 35* 43*  CREATININE 6.69* 7.57*  CALCIUM  8.3* 7.6*   GFR: Estimated Creatinine Clearance: 16 mL/min (A) (by C-G formula based on SCr of 7.57 mg/dL (H)).  Liver Function Tests: No results for input(s): AST, ALT, ALKPHOS, BILITOT, PROT, ALBUMIN  in the last 168 hours. No results for input(s): LIPASE, AMYLASE in the last 168 hours. No results for input(s): AMMONIA in the last 168 hours.  CBC: Recent Labs  Lab 06/09/24 1559 06/10/24 0420  WBC 8.4 9.7  HGB 8.7* 8.0*  HCT 29.3* 27.6*  MCV 78.8* 78.6*  PLT 154 149*     HbA1C: HbA1c, POC (controlled diabetic range)  Date/Time Value Ref Range Status  05/18/2023 01:56 PM 6.6 0.0 - 7.0 % Final   Hgb A1c MFr Bld  Date/Time Value Ref Range Status  06/09/2024  09:39 PM 7.3 (H) 4.8 - 5.6 % Final    Comment:    (NOTE) Diagnosis of Diabetes The following HbA1c ranges recommended by the American Diabetes Association (ADA) may be used as an aid in the diagnosis of diabetes mellitus.  Hemoglobin             Suggested A1C NGSP%              Diagnosis  <5.7                   Non Diabetic  5.7-6.4                Pre-Diabetic  >6.4                   Diabetic  <7.0                   Glycemic control for                       adults with diabetes.    10/13/2023 10:50 PM 6.1 (H) 4.8 - 5.6 % Final    Comment:    (NOTE) Pre diabetes:          5.7%-6.4%  Diabetes:              >6.4%  Glycemic  control for   <7.0% adults with diabetes     Urinalysis: No results for input(s): COLORURINE, LABSPEC, PHURINE, GLUCOSEU, HGBUR, BILIRUBINUR, KETONESUR, PROTEINUR, UROBILINOGEN, NITRITE, LEUKOCYTESUR in the last 72 hours.  Invalid input(s): APPERANCEUR    Imaging: CT Angio Chest PE W/Cm &/Or Wo Cm Result Date: 06/09/2024 CLINICAL DATA:  Pulmonary embolism (PE) suspected, high prob ight side CP- ongoing since last week. States it started after having a cyst removed on rectum and right knee. Denies cardiac hx. Also having left leg pain/redness x3 days. EXAM: CT ANGIOGRAPHY CHEST WITH CONTRAST TECHNIQUE: Multidetector CT imaging of the chest was performed using the standard protocol during bolus administration of intravenous contrast. Multiplanar CT image reconstructions and MIPs were obtained to evaluate the vascular anatomy. RADIATION DOSE REDUCTION: This exam was performed according to the departmental dose-optimization program which includes automated exposure control, adjustment of the mA and/or kV according to patient size and/or use of iterative reconstruction technique. CONTRAST:  75mL OMNIPAQUE  IOHEXOL  350 MG/ML SOLN COMPARISON:  CT abdomen pelvis 01/22/2024, CT abdomen pelvis 08/18/2019, CT chest 04/13/2024 FINDINGS:  Cardiovascular: Right chest wall dialysis catheter with tip in the right atrium. Satisfactory opacification of the pulmonary arteries to the segmental level. No evidence of pulmonary embolism. Limited evaluation of the subsegmental level due to timing of contrast. Prominent heart size. No significant pericardial effusion. The thoracic aorta is normal in caliber. No atherosclerotic plaque of the thoracic aorta. At least 2 vessel coronary artery calcifications. Mediastinum/Nodes: Redemonstration of multiple prominent and borderline enlarged mediastinal lymph nodes. No enlarged hilar or axillary lymph nodes. Thyroid gland, trachea, and esophagus demonstrate no significant findings. Lungs/Pleura: No focal consolidation. No pulmonary nodule. No pulmonary mass. No increase of bilateral small to moderate volume pleural effusions. No pneumothorax. Upper Abdomen: Redemonstration of a chronic left adrenal gland nodule consistent with adenoma-no further follow-up indicated. Enlarged spleen measuring up to 15 cm. Musculoskeletal: No chest wall abnormality.  Subcutaneus soft tissue edema. No suspicious lytic or blastic osseous lesions. No acute displaced fracture. Multilevel degenerative changes of the spine. Similar-appearing exaggerated kyphotic curvature of the mid to lower thoracic spine with multilevel vertebral body height loss. Interval increase in vertebral body height loss at the L1 superior endplate. Review of the MIP images confirms the above findings. IMPRESSION: 1. No central or segmental pulmonary embolus. Limited evaluation more distally due to timing of contrast. 2. Interval increase of bilateral small to moderate volume pleural effusions. 3. Persistent prominent/borderline enlarged mediastinal lymph nodes that may be reactive in etiology. Recommend attention on follow-up. 4. Splenomegaly. 5. Interval increase in vertebral body height loss at the L1 superior endplate. Correlate with point tenderness to palpation  to evaluate for an acute component. 6. At least 2 vessel coronary artery calcification. Electronically Signed   By: Morgane  Naveau M.D.   On: 06/09/2024 18:34   US  Venous Img Lower Unilateral Left Result Date: 06/09/2024 CLINICAL DATA:  edema left lower extremity. Pain. Tobacco use. Color changes and ulcerations EXAM: Left LOWER EXTREMITY VENOUS DOPPLER ULTRASOUND TECHNIQUE: Gray-scale sonography with compression, as well as color and duplex ultrasound, were performed to evaluate the deep venous system(s) from the level of the common femoral vein through the popliteal and proximal calf veins. COMPARISON:  None Available. FINDINGS: VENOUS Normal compressibility of the common femoral, superficial femoral, and popliteal veins, as well as the visualized calf veins. Visualized portions of profunda femoral vein and great saphenous vein unremarkable. No filling defects to suggest DVT on grayscale or color Doppler imaging. Doppler waveforms  show normal direction of venous flow, normal respiratory plasticity and response to augmentation. Limited views of the contralateral common femoral vein are unremarkable. OTHER Popliteal fossa demonstrates a 6.2 x 1.2 x 4.1 cm internally heterogeneous hypoechoic cystic lesion. Subcutaneus soft tissue edema of the leg. Limitations: none IMPRESSION: 1. No deep venous thrombosis of the left lower extremity. 2. Popliteal fossa demonstrates a 6.2 x 1.2 x 4.1 cm internally heterogeneous hypoechoic cystic lesion. Finding likely represents a liquified hematoma versus Baker cyst. 3. Subcutaneus soft tissue edema of the leg. Electronically Signed   By: Morgane  Naveau M.D.   On: 06/09/2024 18:21   DG Chest 2 View Result Date: 06/09/2024 CLINICAL DATA:  Chest pain since last week EXAM: CHEST - 2 VIEW COMPARISON:  03/26/2024 from [facility], without report. A chest CT of 04/13/2024 is also reviewed. FINDINGS: Hyperinflation. Right dialysis catheter tips poorly visualized, likely mid right  atrium. Midline trachea. Normal heart size. Small left and trace right pleural effusions. No pneumothorax. Mild pulmonary venous congestion. Bibasilar airspace disease. IMPRESSION: 1. Small left and trace right pleural effusions with bibasilar airspace disease, favoring atelectasis. 2. Mild pulmonary venous congestion. Electronically Signed   By: Rockey Kilts M.D.   On: 06/09/2024 16:46     Medications:     ceFAZolin  (ANCEF ) IV Stopped (06/10/24 0115)   metronidazole  Stopped (06/10/24 0027)    atorvastatin   40 mg Oral Daily   budesonide -glycopyrrolate -formoterol   2 puff Inhalation BID   calcitRIOL   0.25 mcg Oral Q T,Th,Sa-HD   calcium  acetate  1,334 mg Oral TID WC   Chlorhexidine  Gluconate Cloth  6 each Topical Q0600   [START ON 06/11/2024] Chlorhexidine  Gluconate Cloth  6 each Topical Daily   diltiazem   300 mg Oral Daily   FLUoxetine   20 mg Oral Daily   gabapentin   300 mg Oral Daily   hydrALAZINE   50 mg Oral TID   insulin  pump   Subcutaneous TID WC, HS, 0200   losartan   25 mg Oral Daily   metoprolol  succinate  25 mg Oral Daily   pantoprazole   40 mg Oral Daily    Assessment/ Plan:     53 year old white male with a history of diabetes, hypertension, coronary artery disease, peripheral arterial disease, history of necrotizing fasciitis, status post right BKA and end-stage renal disease on dialysis on a Tuesday Thursday Saturday schedule now admitted through the emergency room with history of shortness of breath and exertional dyspnea.  #1: End-stage renal disease: Patient was given IV Lasix  last night.  He has been fluid noncompliant.  Will dialyze him this morning with 3 to 3.4 L fluid removal as tolerated.  #2: Hypertension/fluid overload: Will continue the losartan , metoprolol , hydralazine  and diltiazem  as ordered.  He is advised on the importance of 2 g salt restricted diet.  #3: Anemia: Will continue to monitor hemoglobin and continue the anemia protocol.  #4: Diabetes: Continue  insulin  as ordered.  #5: Secondary hyperparathyroidism: Will continue the calcitriol  and calcium  acetate.  Spoke to the patient in detail on the importance of fluid restriction.  Labs and medications reviewed. Will continue to follow along with you.   LOS: 1 Pinkey Edman, MD [facility] 7/19/20259:54 AM

## 2024-06-11 DIAGNOSIS — L03116 Cellulitis of left lower limb: Secondary | ICD-10-CM | POA: Diagnosis not present

## 2024-06-11 LAB — GLUCOSE, CAPILLARY
Glucose-Capillary: 204 mg/dL — ABNORMAL HIGH (ref 70–99)
Glucose-Capillary: 245 mg/dL — ABNORMAL HIGH (ref 70–99)

## 2024-06-11 LAB — HEPATITIS B SURFACE ANTIBODY, QUANTITATIVE: Hep B S AB Quant (Post): <3.5 m[IU]/mL — ABNORMAL LOW

## 2024-06-11 LAB — BASIC METABOLIC PANEL WITH GFR
Anion gap: 10 (ref 5–15)
BUN: 41 mg/dL — ABNORMAL HIGH (ref 6–20)
CO2: 23 mmol/L (ref 22–32)
Calcium: 8 mg/dL — ABNORMAL LOW (ref 8.9–10.3)
Chloride: 101 mmol/L (ref 98–111)
Creatinine, Ser: 6.49 mg/dL — ABNORMAL HIGH (ref 0.61–1.24)
GFR, Estimated: 10 mL/min — ABNORMAL LOW (ref >60)
Glucose, Bld: 216 mg/dL — ABNORMAL HIGH (ref 70–99)
Potassium: 5.3 mmol/L — ABNORMAL HIGH (ref 3.5–5.1)
Sodium: 134 mmol/L — ABNORMAL LOW (ref 135–145)

## 2024-06-11 MED ORDER — METHOCARBAMOL 750 MG PO TABS: 750.0000 mg | ORAL_TABLET | Freq: Three times a day (TID) | ORAL | 0 refills | Status: DC | PRN

## 2024-06-11 MED ORDER — SODIUM ZIRCONIUM CYCLOSILICATE 10 G PO PACK
10.0000 g | PACK | Freq: Once | ORAL | Status: AC
  Administered 2024-06-11: 10 g via ORAL
  Filled 2024-06-11: qty 1

## 2024-06-11 MED ORDER — NICOTINE 14 MG/24HR TD PT24: 14.0000 mg | MEDICATED_PATCH | Freq: Every day | TRANSDERMAL | 0 refills | Status: DC

## 2024-06-11 MED ORDER — CEPHALEXIN 500 MG PO CAPS: 500.0000 mg | ORAL_CAPSULE | Freq: Four times a day (QID) | ORAL | 0 refills | Status: AC

## 2024-06-11 NOTE — Discharge Summary (Signed)
 Physician Discharge Summary   Patient: [name] [name] MRN: [id number] DOB: 09/11/1971  Admit date:     06/09/2024  Discharge date: 06/11/24  Discharge Physician: Delon Herald   PCP: Adele Song, MD   Recommendations at discharge:   Complete antibiotics (Cephalexin /Keflex  daily for 5 more days) Home health nursing ordered for wound care Continue Robaxin  as needed for leg pain  Go to dialysis as scheduled Follow up with Dr. Adele in 1-2 weeks Follow up with vascular surgery for wound care this week Stop smoking; nicotine  patch provided  Discharge Diagnoses: Principal Problem:   Cellulitis of left lower extremity Active Problems:   Severe hypertension   History of right below knee amputation (HCC)   Tobacco dependence   T2DM (type 2 diabetes mellitus) (HCC)   ESRD (end stage renal disease) on dialysis (HCC)   Volume overload    Hospital Course: 53yo with h/o ESRD on HD, DM, HTN, and PAD s/p R BKA and L great toe amputation who presented on 7/18 with chest and leg pain as well as DOE.  He was found to have a markedly elevated BNP with CT showing worsening B pleural effusion, likely from volume overload.  DVT US  negative for DVT but with L popliteal fossa hypoechoic lesion concerning for hematoma vs. Baker's cyst and physical exam was concerning for cellulitis.  He was started on antibiotics and planned for HD.  Assessment and Plan:  Acute left lower extremity cellulitis Presented with multiple complaints, including LLE pain with erythema Appears to have been started on PO doxycycline  on 7/16 Patient will be empirically covered with IV antibiotics to cover for skin flora Area of erythema appears to be improving based on markings Continue IV -> PO antibiotics for 7 total days   Left thigh/calf spasm Patient reports constant posterior L thigh pain x 3 days Duplex ultrasound ruled out DVT but popliteal fossa of left lower extremity demonstrates a 6.2 x 1.2 x 4.1 cm  hypoechoic cystic lesion concerning for Baker's cyst versus a liquefied hematoma Will reach out to orthopedics to see if US  indicates a drainable collection - Dr. Mardee does not recommend, as these cysts will generally recur after aspiration Will ensure that he has muscle relaxant ordered   Right-sided CP Elevated BNP + CXR indicate volume overload in the setting of ESRD Reports compliance with HD Troponin mildly elevated, likely from renal disease; negative delta, low suspicion for ACS No further complaints about this issue  Pressure ulcer Large unstageable pressure ulcer on R buttock Wound care consult ordered [facility] for [facility] care If unable to arrange for [facility], this could alternatively be packed at HD each week   End-stage renal disease  On TTS HD, has not missed or shortened a session On RA Underwent HD on 7/20 Given 80 mg IV Lasix  x 1 Continue calcitriol , Phoslo    Type 2 diabetes mellitus Reports having an insulin  pump but RN reports that he is not giving himself any insulin  A1c 7.3, reasonable control Resume insulin  pump at time of discharge  Carb modified diet  Continue gabapentin    Peripheral arterial disease status post right BKA BKA stump is C/D/I, has prosthetic at bedside Continue atorvastatin    Resistant hypertension Blood pressure not at goal Resume home BP medications including hydralazine , diltiazem , losartan  He has not been taking Toprol  XL but this was resumed at time of admission IV hydralazine  as needed for SBP greater than 180   COPD Stop smoking  Continue Trelegy (Breztri  per formulary)  Nicotine  patch ordered   Depression Continue fluoxetine    Recent history of AV fistula with wound dehiscence Seen and evaluated by vascular surgery on 7/16 Seroma evacuated Needs wound care f/u this coming week Smoking cessation encouraged   Incidental finding of two-vessel coronary artery calcifications  Noted on CT scan Monitor for development  of symptoms concerning for ACS Followed by vascular surgery and PCP as outpatient         Consultants: Nephrology   Procedures: DVT US  7/18 HD 7/19   Antibiotics: Cefazolin  7/18-20 Metronidazole  7/18-20  Pain control - [place]  Controlled Substance Reporting System database was reviewed. and patient was instructed, not to drive, operate heavy machinery, perform activities at heights, swimming or participation in water activities or provide baby-sitting services while on Pain, Sleep and Anxiety Medications; until their outpatient Physician has advised to do so again. Also recommended to not to take more than prescribed Pain, Sleep and Anxiety Medications.   Disposition: Home Diet recommendation:  Renal diet DISCHARGE MEDICATION: Allergies as of 06/11/2024       Reactions   Iron  Nausea And Vomiting   With the iron  tablets   Tape Rash   Rash at site of tape--okay to use paper tape        Medication List     STOP taking these medications    doxycycline  100 MG capsule Commonly known as: VIBRAMYCIN    sevelamer  carbonate 800 MG tablet Commonly known as: RENVELA        TAKE these medications    Accu-Chek Guide Me w/Device Kit See admin instructions.   Accu-Chek Softclix Lancets lancets Use to check blood sugar twice per day. E11.9   albuterol  108 (90 Base) MCG/ACT inhaler Commonly known as: VENTOLIN  HFA Inhale 2 puffs into the lungs every 6 (six) hours as needed for wheezing or shortness of breath.   atorvastatin  40 MG tablet Commonly known as: LIPITOR TAKE 1 TABLET(40 MG) BY MOUTH AT BEDTIME   calcitRIOL  0.25 MCG capsule Commonly known as: ROCALTROL  Take 1 capsule (0.25 mcg total) by mouth Every Tuesday,Thursday,and Saturday with dialysis.   Calcium  Acetate 667 MG Tabs Take 1,334 mg by mouth 3 (three) times daily.   cephALEXin  500 MG capsule Commonly known as: KEFLEX  Take 1 capsule (500 mg total) by mouth 4 (four) times daily for 5 days.    diltiazem  300 MG 24 hr capsule Commonly known as: CARDIZEM  CD TAKE 1 CAPSULE(300 MG) BY MOUTH DAILY   FLUoxetine  20 MG capsule Commonly known as: PROZAC  TAKE 1 CAPSULE(20 MG) BY MOUTH DAILY   fluticasone  50 MCG/ACT nasal spray Commonly known as: FLONASE  Place 2 sprays into both nostrils daily.   gabapentin  300 MG capsule Commonly known as: NEURONTIN  Take 1 capsule (300 mg total) by mouth daily.   glucose blood test strip 1 each by Other route as needed for other. Use as instructed   glucose blood test strip Commonly known as: AgaMatrix Presto Test Check blood sugars twice daily before meals   HumaLOG  100 UNIT/ML injection Generic drug: insulin  lispro 0-6 Units, Subcutaneous, 3 times daily with meals, First dose on Sun 10/17/23 at 0800 Correction coverage: Very Sensitive (ESRD/Dialysis) CBG < 70: Implement Hypoglycemia Standing Orders and refer to Hypoglycemia Standing Orders sidebar report CBG 70 - 120: 0 units CBG 121 - 150: 0 units CBG 151 - 200: 1 unit CBG 201-250: 2 units CBG 251-300: 3 units CBG 301-350: 4 units CBG 351-400: 5 units CBG > 400: Give 6 units and call MD What changed:  how to take this when to take this additional instructions   hydrALAZINE  25 MG tablet Commonly known as: APRESOLINE  Take 75 mg by mouth 3 (three) times daily.   HYDROcodone -acetaminophen  5-325 MG tablet Commonly known as: NORCO/VICODIN Take 1 tablet by mouth every 6 (six) hours as needed for up to 5 days for severe pain (pain score 7-10).   loratadine  10 MG tablet Commonly known as: Claritin  Take 1 tablet (10 mg total) by mouth daily.   losartan  25 MG tablet Commonly known as: COZAAR  TAKE 1 TABLET(25 MG) BY MOUTH DAILY   methocarbamol  750 MG tablet Commonly known as: ROBAXIN  Take 1 tablet (750 mg total) by mouth every 8 (eight) hours as needed for muscle spasms.   metoprolol  succinate 25 MG 24 hr tablet Commonly known as: TOPROL -XL Take 25 mg by mouth daily.    nicotine  14 mg/24hr patch Commonly known as: NICODERM CQ  - dosed in mg/24 hours Place 1 patch (14 mg total) onto the skin daily. Start taking on: June 12, 2024   ondansetron  4 MG disintegrating tablet Commonly known as: ZOFRAN -ODT Take 1 tablet (4 mg total) by mouth every 8 (eight) hours as needed for nausea or vomiting.   pantoprazole  40 MG tablet Commonly known as: PROTONIX  TAKE 1 TABLET(40 MG) BY MOUTH DAILY   Trelegy Ellipta  100-62.5-25 MCG/ACT Aepb Generic drug: Fluticasone -Umeclidin-Vilant INHALE 1 PUFF INTO LUNGS DAILY               Discharge Care Instructions  (From admission, onward)           Start     Ordered   06/11/24 0000  Discharge wound care:       Comments: Per home health wound care   06/11/24 1316            Discharge Exam:    Subjective: Feels ok today, no new concerns.  Reports that home health nursing was doing wound care on his buttocks pressure ulcer (POA) but only came once.   Objective: Vitals:   06/11/24 0418 06/11/24 0807  BP: (!) 156/80 (!) 167/92  Pulse: 77 80  Resp: 18 18  Temp: 97.8 F (36.6 C) 98.4 F (36.9 C)  SpO2: 99% 93%    Intake/Output Summary (Last 24 hours) at 06/11/2024 1316 Last data filed at 06/11/2024 1000 Gross per 24 hour  Intake 1320 ml  Output --  Net 1320 ml   Filed Weights   06/09/24 1556 06/10/24 0854 06/10/24 1239  Weight: 111.1 kg 111.9 kg 106.3 kg    Exam:  General:  Appears calm and comfortable and is in NAD, on RA Eyes:   normal lids, iris ENT:  grossly normal hearing, lips & tongue, mmm Cardiovascular:  RRR. No LE edema.  Respiratory:   CTA bilaterally with no wheezes/rales/rhonchi.  Normal respiratory effort. Abdomen:  soft, NT, ND Skin:  L lower leg with improving erythema based on markings, overall reassuring condition Musculoskeletal:  s/p R BKA; patient was able to extend his left leg and the posterior thigh was unremarkable in appearance without erythema Psychiatric:   blunted mood and affect, speech fluent and appropriate, AOx3 Neurologic:  CN 2-12 grossly intact, moves all extremities in coordinated fashion  Data Reviewed: I have reviewed the patient's lab results since admission.  Pertinent labs for today include:   K+ 5.3 Glucose 216 BUN 41/Creatinine 6.49/GFR 10     Condition at discharge: good  The results of significant diagnostics from this hospitalization (including imaging, microbiology, ancillary and laboratory)  are listed below for reference.   Imaging Studies: CT Angio Chest PE W/Cm &/Or Wo Cm Result Date: 06/09/2024 CLINICAL DATA:  Pulmonary embolism (PE) suspected, high prob ight side CP- ongoing since last week. States it started after having a cyst removed on rectum and right knee. Denies cardiac hx. Also having left leg pain/redness x3 days. EXAM: CT ANGIOGRAPHY CHEST WITH CONTRAST TECHNIQUE: Multidetector CT imaging of the chest was performed using the standard protocol during bolus administration of intravenous contrast. Multiplanar CT image reconstructions and MIPs were obtained to evaluate the vascular anatomy. RADIATION DOSE REDUCTION: This exam was performed according to the departmental dose-optimization program which includes automated exposure control, adjustment of the mA and/or kV according to patient size and/or use of iterative reconstruction technique. CONTRAST:  75mL OMNIPAQUE  IOHEXOL  350 MG/ML SOLN COMPARISON:  CT abdomen pelvis 01/22/2024, CT abdomen pelvis 08/18/2019, CT chest 04/13/2024 FINDINGS: Cardiovascular: Right chest wall dialysis catheter with tip in the right atrium. Satisfactory opacification of the pulmonary arteries to the segmental level. No evidence of pulmonary embolism. Limited evaluation of the subsegmental level due to timing of contrast. Prominent heart size. No significant pericardial effusion. The thoracic aorta is normal in caliber. No atherosclerotic plaque of the thoracic aorta. At least 2 vessel  coronary artery calcifications. Mediastinum/Nodes: Redemonstration of multiple prominent and borderline enlarged mediastinal lymph nodes. No enlarged hilar or axillary lymph nodes. Thyroid gland, trachea, and esophagus demonstrate no significant findings. Lungs/Pleura: No focal consolidation. No pulmonary nodule. No pulmonary mass. No increase of bilateral small to moderate volume pleural effusions. No pneumothorax. Upper Abdomen: Redemonstration of a chronic left adrenal gland nodule consistent with adenoma-no further follow-up indicated. Enlarged spleen measuring up to 15 cm. Musculoskeletal: No chest wall abnormality.  Subcutaneus soft tissue edema. No suspicious lytic or blastic osseous lesions. No acute displaced fracture. Multilevel degenerative changes of the spine. Similar-appearing exaggerated kyphotic curvature of the mid to lower thoracic spine with multilevel vertebral body height loss. Interval increase in vertebral body height loss at the L1 superior endplate. Review of the MIP images confirms the above findings. IMPRESSION: 1. No central or segmental pulmonary embolus. Limited evaluation more distally due to timing of contrast. 2. Interval increase of bilateral small to moderate volume pleural effusions. 3. Persistent prominent/borderline enlarged mediastinal lymph nodes that may be reactive in etiology. Recommend attention on follow-up. 4. Splenomegaly. 5. Interval increase in vertebral body height loss at the L1 superior endplate. Correlate with point tenderness to palpation to evaluate for an acute component. 6. At least 2 vessel coronary artery calcification. Electronically Signed   By: Morgane  Naveau M.D.   On: 06/09/2024 18:34   US  Venous Img Lower Unilateral Left Result Date: 06/09/2024 CLINICAL DATA:  edema left lower extremity. Pain. Tobacco use. Color changes and ulcerations EXAM: Left LOWER EXTREMITY VENOUS DOPPLER ULTRASOUND TECHNIQUE: Gray-scale sonography with compression, as well as  color and duplex ultrasound, were performed to evaluate the deep venous system(s) from the level of the common femoral vein through the popliteal and proximal calf veins. COMPARISON:  None Available. FINDINGS: VENOUS Normal compressibility of the common femoral, superficial femoral, and popliteal veins, as well as the visualized calf veins. Visualized portions of profunda femoral vein and great saphenous vein unremarkable. No filling defects to suggest DVT on grayscale or color Doppler imaging. Doppler waveforms show normal direction of venous flow, normal respiratory plasticity and response to augmentation. Limited views of the contralateral common femoral vein are unremarkable. OTHER Popliteal fossa demonstrates a 6.2 x 1.2 x  4.1 cm internally heterogeneous hypoechoic cystic lesion. Subcutaneus soft tissue edema of the leg. Limitations: none IMPRESSION: 1. No deep venous thrombosis of the left lower extremity. 2. Popliteal fossa demonstrates a 6.2 x 1.2 x 4.1 cm internally heterogeneous hypoechoic cystic lesion. Finding likely represents a liquified hematoma versus Baker cyst. 3. Subcutaneus soft tissue edema of the leg. Electronically Signed   By: Morgane  Naveau M.D.   On: 06/09/2024 18:21   DG Chest 2 View Result Date: 06/09/2024 CLINICAL DATA:  Chest pain since last week EXAM: CHEST - 2 VIEW COMPARISON:  03/26/2024 from [facility], without report. A chest CT of 04/13/2024 is also reviewed. FINDINGS: Hyperinflation. Right dialysis catheter tips poorly visualized, likely mid right atrium. Midline trachea. Normal heart size. Small left and trace right pleural effusions. No pneumothorax. Mild pulmonary venous congestion. Bibasilar airspace disease. IMPRESSION: 1. Small left and trace right pleural effusions with bibasilar airspace disease, favoring atelectasis. 2. Mild pulmonary venous congestion. Electronically Signed   By: Rockey Kilts M.D.   On: 06/09/2024 16:46    Microbiology: Results for orders  placed or performed during the hospital encounter of 06/09/24  Culture, blood (Routine X 2) w Reflex to ID Panel     Status: None (Preliminary result)   Collection Time: 06/09/24  9:39 PM   Specimen: BLOOD  Result Value Ref Range Status   Specimen Description BLOOD RIGHT ARM  Final   Special Requests   Final    BOTTLES DRAWN AEROBIC AND ANAEROBIC Blood Culture results may not be optimal due to an inadequate volume of blood received in culture bottles   Culture   Final    NO GROWTH 2 DAYS Performed at [facility], [address], [place], [place] [address]    Report Status PENDING  Incomplete  Culture, blood (Routine X 2) w Reflex to ID Panel     Status: None (Preliminary result)   Collection Time: 06/09/24  9:39 PM   Specimen: BLOOD  Result Value Ref Range Status   Specimen Description BLOOD RIGHT HAND  Final   Special Requests   Final    BOTTLES DRAWN AEROBIC AND ANAEROBIC Blood Culture results may not be optimal due to an inadequate volume of blood received in culture bottles   Culture   Final    NO GROWTH 2 DAYS Performed at [facility], [address] [place], [place] [address]    Report Status PENDING  Incomplete    Labs: CBC: Recent Labs  Lab 06/09/24 1559 06/10/24 0420  WBC 8.4 9.7  HGB 8.7* 8.0*  HCT 29.3* 27.6*  MCV 78.8* 78.6*  PLT 154 149*   Basic Metabolic Panel: Recent Labs  Lab 06/09/24 1559 06/10/24 0420 06/11/24 0431  NA 138 138 134*  K 4.1 4.5 5.3*  CL 101 105 101  CO2 23 24 23   GLUCOSE 285* 195* 216*  BUN 35* 43* 41*  CREATININE 6.69* 7.57* 6.49*  CALCIUM  8.3* 7.6* 8.0*   Liver Function Tests: No results for input(s): AST, ALT, ALKPHOS, BILITOT, PROT, ALBUMIN  in the last 168 hours. CBG: Recent Labs  Lab 06/10/24 1307 06/10/24 1751 06/10/24 2121 06/11/24 0808 06/11/24 1221  GLUCAP 132* 167* 214* 204* 245*    Discharge time spent: greater than 30 minutes.  Signed: Delon Herald, MD [facility] 06/11/2024

## 2024-06-11 NOTE — Consult Note (Incomplete)
 WOC Nurse Consult Note: Reason for Consult: right buttock and right posterior knee Hospitalized recently for incision and drainage of wound to posterior right popliteal fossa as well as right perianal/gluteal ESRD on HD; admitted for chest pain and leg pain R BKA and left great toe amp. Wound type: buttock and posterior knee; surgical; non healing  Pressure Injury POA: Yes/No/NA Measurement: Wound bed: Drainage (amount, consistency, odor)  Periwound: Dressing procedure/placement/frequency: Conservative sharp wound debridement (CSWD performed at the bedside):

## 2024-06-11 NOTE — Progress Notes (Signed)
 [facility]  PROGRESS NOTE   Subjective:   Patient feels much better.  Had stable dialysis yesterday.   Objective:  Vital signs: Blood pressure (!) 167/92, pulse 80, temperature 98.4 F (36.9 C), temperature source Oral, resp. rate 18, height 6' 5 (1.956 m), weight 106.3 kg, SpO2 93%.  Intake/Output Summary (Last 24 hours) at 06/11/2024 1218 Last data filed at 06/11/2024 1000 Gross per 24 hour  Intake 1320 ml  Output 72 ml  Net 1248 ml   Filed Weights   06/09/24 1556 06/10/24 0854 06/10/24 1239  Weight: 111.1 kg 111.9 kg 106.3 kg     Physical Exam: General:  No acute distress  Head:  Normocephalic, atraumatic. Moist oral mucosal membranes  Eyes:  Anicteric  Neck:  Supple  Lungs:   Clear to auscultation, normal effort  Heart:  S1S2 no rubs  Abdomen:   Soft, nontender, bowel sounds present  Extremities:  peripheral edema.  Neurologic:  Awake, alert, following commands  Skin:  No lesions  Access:     Basic Metabolic Panel: Recent Labs  Lab 06/09/24 1559 06/10/24 0420 06/11/24 0431  NA 138 138 134*  K 4.1 4.5 5.3*  CL 101 105 101  CO2 23 24 23   GLUCOSE 285* 195* 216*  BUN 35* 43* 41*  CREATININE 6.69* 7.57* 6.49*  CALCIUM  8.3* 7.6* 8.0*   GFR: Estimated Creatinine Clearance: 17 mL/min (A) (by C-G formula based on SCr of 6.49 mg/dL (H)).  Liver Function Tests: No results for input(s): AST, ALT, ALKPHOS, BILITOT, PROT, ALBUMIN  in the last 168 hours. No results for input(s): LIPASE, AMYLASE in the last 168 hours. No results for input(s): AMMONIA in the last 168 hours.  CBC: Recent Labs  Lab 06/09/24 1559 06/10/24 0420  WBC 8.4 9.7  HGB 8.7* 8.0*  HCT 29.3* 27.6*  MCV 78.8* 78.6*  PLT 154 149*     HbA1C: HbA1c, POC (controlled diabetic range)  Date/Time Value Ref Range Status  05/18/2023 01:56 PM 6.6 0.0 - 7.0 % Final   Hgb A1c MFr Bld  Date/Time Value Ref Range Status  06/09/2024 09:39 PM 7.3 (H) 4.8 - 5.6 %  Final    Comment:    (NOTE) Diagnosis of Diabetes The following HbA1c ranges recommended by the American Diabetes Association (ADA) may be used as an aid in the diagnosis of diabetes mellitus.  Hemoglobin             Suggested A1C NGSP%              Diagnosis  <5.7                   Non Diabetic  5.7-6.4                Pre-Diabetic  >6.4                   Diabetic  <7.0                   Glycemic control for                       adults with diabetes.    10/13/2023 10:50 PM 6.1 (H) 4.8 - 5.6 % Final    Comment:    (NOTE) Pre diabetes:          5.7%-6.4%  Diabetes:              >6.4%  Glycemic control for   <  7.0% adults with diabetes     Urinalysis: No results for input(s): COLORURINE, LABSPEC, PHURINE, GLUCOSEU, HGBUR, BILIRUBINUR, KETONESUR, PROTEINUR, UROBILINOGEN, NITRITE, LEUKOCYTESUR in the last 72 hours.  Invalid input(s): APPERANCEUR    Imaging: CT Angio Chest PE W/Cm &/Or Wo Cm Result Date: 06/09/2024 CLINICAL DATA:  Pulmonary embolism (PE) suspected, high prob ight side CP- ongoing since last week. States it started after having a cyst removed on rectum and right knee. Denies cardiac hx. Also having left leg pain/redness x3 days. EXAM: CT ANGIOGRAPHY CHEST WITH CONTRAST TECHNIQUE: Multidetector CT imaging of the chest was performed using the standard protocol during bolus administration of intravenous contrast. Multiplanar CT image reconstructions and MIPs were obtained to evaluate the vascular anatomy. RADIATION DOSE REDUCTION: This exam was performed according to the departmental dose-optimization program which includes automated exposure control, adjustment of the mA and/or kV according to patient size and/or use of iterative reconstruction technique. CONTRAST:  75mL OMNIPAQUE  IOHEXOL  350 MG/ML SOLN COMPARISON:  CT abdomen pelvis 01/22/2024, CT abdomen pelvis 08/18/2019, CT chest 04/13/2024 FINDINGS: Cardiovascular: Right chest wall  dialysis catheter with tip in the right atrium. Satisfactory opacification of the pulmonary arteries to the segmental level. No evidence of pulmonary embolism. Limited evaluation of the subsegmental level due to timing of contrast. Prominent heart size. No significant pericardial effusion. The thoracic aorta is normal in caliber. No atherosclerotic plaque of the thoracic aorta. At least 2 vessel coronary artery calcifications. Mediastinum/Nodes: Redemonstration of multiple prominent and borderline enlarged mediastinal lymph nodes. No enlarged hilar or axillary lymph nodes. Thyroid gland, trachea, and esophagus demonstrate no significant findings. Lungs/Pleura: No focal consolidation. No pulmonary nodule. No pulmonary mass. No increase of bilateral small to moderate volume pleural effusions. No pneumothorax. Upper Abdomen: Redemonstration of a chronic left adrenal gland nodule consistent with adenoma-no further follow-up indicated. Enlarged spleen measuring up to 15 cm. Musculoskeletal: No chest wall abnormality.  Subcutaneus soft tissue edema. No suspicious lytic or blastic osseous lesions. No acute displaced fracture. Multilevel degenerative changes of the spine. Similar-appearing exaggerated kyphotic curvature of the mid to lower thoracic spine with multilevel vertebral body height loss. Interval increase in vertebral body height loss at the L1 superior endplate. Review of the MIP images confirms the above findings. IMPRESSION: 1. No central or segmental pulmonary embolus. Limited evaluation more distally due to timing of contrast. 2. Interval increase of bilateral small to moderate volume pleural effusions. 3. Persistent prominent/borderline enlarged mediastinal lymph nodes that may be reactive in etiology. Recommend attention on follow-up. 4. Splenomegaly. 5. Interval increase in vertebral body height loss at the L1 superior endplate. Correlate with point tenderness to palpation to evaluate for an acute  component. 6. At least 2 vessel coronary artery calcification. Electronically Signed   By: Morgane  Naveau M.D.   On: 06/09/2024 18:34   US  Venous Img Lower Unilateral Left Result Date: 06/09/2024 CLINICAL DATA:  edema left lower extremity. Pain. Tobacco use. Color changes and ulcerations EXAM: Left LOWER EXTREMITY VENOUS DOPPLER ULTRASOUND TECHNIQUE: Gray-scale sonography with compression, as well as color and duplex ultrasound, were performed to evaluate the deep venous system(s) from the level of the common femoral vein through the popliteal and proximal calf veins. COMPARISON:  None Available. FINDINGS: VENOUS Normal compressibility of the common femoral, superficial femoral, and popliteal veins, as well as the visualized calf veins. Visualized portions of profunda femoral vein and great saphenous vein unremarkable. No filling defects to suggest DVT on grayscale or color Doppler imaging. Doppler waveforms show normal direction of  venous flow, normal respiratory plasticity and response to augmentation. Limited views of the contralateral common femoral vein are unremarkable. OTHER Popliteal fossa demonstrates a 6.2 x 1.2 x 4.1 cm internally heterogeneous hypoechoic cystic lesion. Subcutaneus soft tissue edema of the leg. Limitations: none IMPRESSION: 1. No deep venous thrombosis of the left lower extremity. 2. Popliteal fossa demonstrates a 6.2 x 1.2 x 4.1 cm internally heterogeneous hypoechoic cystic lesion. Finding likely represents a liquified hematoma versus Baker cyst. 3. Subcutaneus soft tissue edema of the leg. Electronically Signed   By: Morgane  Naveau M.D.   On: 06/09/2024 18:21   DG Chest 2 View Result Date: 06/09/2024 CLINICAL DATA:  Chest pain since last week EXAM: CHEST - 2 VIEW COMPARISON:  03/26/2024 from [facility], without report. A chest CT of 04/13/2024 is also reviewed. FINDINGS: Hyperinflation. Right dialysis catheter tips poorly visualized, likely mid right atrium. Midline trachea.  Normal heart size. Small left and trace right pleural effusions. No pneumothorax. Mild pulmonary venous congestion. Bibasilar airspace disease. IMPRESSION: 1. Small left and trace right pleural effusions with bibasilar airspace disease, favoring atelectasis. 2. Mild pulmonary venous congestion. Electronically Signed   By: Rockey Kilts M.D.   On: 06/09/2024 16:46     Medications:     ceFAZolin  (ANCEF ) IV 1 g (06/10/24 2219)   metronidazole  500 mg (06/11/24 0939)    atorvastatin   40 mg Oral Daily   budesonide -glycopyrrolate -formoterol   2 puff Inhalation BID   calcitRIOL   0.25 mcg Oral Q T,Th,Sa-HD   calcium  acetate  1,334 mg Oral TID WC   Chlorhexidine  Gluconate Cloth  6 each Topical Q0600   Chlorhexidine  Gluconate Cloth  6 each Topical Daily   diltiazem   300 mg Oral Daily   FLUoxetine   20 mg Oral Daily   gabapentin   300 mg Oral Daily   hydrALAZINE   75 mg Oral TID   insulin  aspart  0-6 Units Subcutaneous TID WC   loratadine   10 mg Oral Daily   losartan   25 mg Oral Daily   metoprolol  succinate  25 mg Oral Daily   nicotine   14 mg Transdermal Daily   pantoprazole   40 mg Oral Daily    Assessment/ Plan:     53 year old white male with a history of diabetes, hypertension, coronary artery disease, peripheral arterial disease, history of necrotizing fasciitis, status post right BKA and end-stage renal disease on dialysis on a Tuesday Thursday Saturday schedule now admitted through the emergency room with history of shortness of breath and exertional dyspnea.   #1: End-stage renal disease: Patient has been on TTS dialysis schedule.  Was dialyzed yesterday with 3 L of fluid removal.  His breathing has improved significantly.   #2: Hypertension/fluid overload: Will continue the losartan , metoprolol , hydralazine  and diltiazem  as ordered.  He is advised on the importance of 2 g salt restricted diet.   #3: Anemia: Will continue to monitor hemoglobin and continue the anemia protocol.   #4:  Diabetes: Continue insulin  as ordered.   #5: Secondary hyperparathyroidism: Will continue the calcitriol  and calcium  acetate.   Spoke to the patient in detail on the importance of fluid restriction. Stable for discharge home.   LOS: 2 Pinkey Edman, MD [facility] 7/20/202512:18 PM

## 2024-06-11 NOTE — Progress Notes (Signed)
 Patient refused CBC to be drawn this morning.

## 2024-06-11 NOTE — Progress Notes (Signed)
 Discharge instructions and med details reviewed with patient. Pt verbalizes understanding. Printed AVS given to patient. IV removed. Dressing to buttocks changed. Wound vac to L arm in place and working properly.  All patient belongings packed. Patient escorted out via wheelchair.   Rohn Fritsch G Patino Hernandez, RN

## 2024-06-11 NOTE — Plan of Care (Signed)
  Problem: Education: Goal: Ability to describe self-care measures that may prevent or decrease complications (Diabetes Survival Skills Education) will improve Outcome: Progressing   Problem: Fluid Volume: Goal: Ability to maintain a balanced intake and output will improve Outcome: Progressing   Problem: Skin Integrity: Goal: Risk for impaired skin integrity will decrease Outcome: Progressing

## 2024-06-12 ENCOUNTER — Other Ambulatory Visit: Payer: Self-pay

## 2024-06-12 DIAGNOSIS — L03116 Cellulitis of left lower limb: Secondary | ICD-10-CM | POA: Insufficient documentation

## 2024-06-12 DIAGNOSIS — N186 End stage renal disease: Secondary | ICD-10-CM

## 2024-06-13 ENCOUNTER — Other Ambulatory Visit: Payer: Self-pay

## 2024-06-13 DIAGNOSIS — T8249XA Other complication of vascular dialysis catheter, initial encounter: Secondary | ICD-10-CM | POA: Diagnosis not present

## 2024-06-13 DIAGNOSIS — L89310 Pressure ulcer of right buttock, unstageable: Secondary | ICD-10-CM | POA: Insufficient documentation

## 2024-06-13 DIAGNOSIS — N186 End stage renal disease: Secondary | ICD-10-CM | POA: Diagnosis not present

## 2024-06-13 DIAGNOSIS — I132 Hypertensive heart and chronic kidney disease with heart failure and with stage 5 chronic kidney disease, or end stage renal disease: Secondary | ICD-10-CM | POA: Diagnosis not present

## 2024-06-13 DIAGNOSIS — N2581 Secondary hyperparathyroidism of renal origin: Secondary | ICD-10-CM | POA: Diagnosis not present

## 2024-06-13 DIAGNOSIS — E8770 Fluid overload, unspecified: Secondary | ICD-10-CM | POA: Diagnosis not present

## 2024-06-13 DIAGNOSIS — J189 Pneumonia, unspecified organism: Secondary | ICD-10-CM

## 2024-06-13 DIAGNOSIS — D631 Anemia in chronic kidney disease: Secondary | ICD-10-CM | POA: Diagnosis not present

## 2024-06-13 DIAGNOSIS — Z992 Dependence on renal dialysis: Secondary | ICD-10-CM | POA: Diagnosis not present

## 2024-06-13 DIAGNOSIS — E1122 Type 2 diabetes mellitus with diabetic chronic kidney disease: Secondary | ICD-10-CM | POA: Diagnosis not present

## 2024-06-13 DIAGNOSIS — L03116 Cellulitis of left lower limb: Secondary | ICD-10-CM | POA: Diagnosis not present

## 2024-06-13 DIAGNOSIS — D689 Coagulation defect, unspecified: Secondary | ICD-10-CM | POA: Diagnosis not present

## 2024-06-13 MED ORDER — ALBUTEROL SULFATE HFA 108 (90 BASE) MCG/ACT IN AERS: 2.0000 | INHALATION_SPRAY | Freq: Four times a day (QID) | RESPIRATORY_TRACT | 2 refills | Status: DC | PRN

## 2024-06-14 ENCOUNTER — Encounter: Payer: Self-pay | Admitting: Family Medicine

## 2024-06-14 ENCOUNTER — Ambulatory Visit (INDEPENDENT_AMBULATORY_CARE_PROVIDER_SITE_OTHER): Admitting: Family Medicine

## 2024-06-14 VITALS — BP 170/85 | HR 90 | Temp 98.5°F | Ht 77.0 in | Wt 243.0 lb

## 2024-06-14 DIAGNOSIS — L03119 Cellulitis of unspecified part of limb: Secondary | ICD-10-CM | POA: Diagnosis not present

## 2024-06-14 DIAGNOSIS — S31819D Unspecified open wound of right buttock, subsequent encounter: Secondary | ICD-10-CM

## 2024-06-14 DIAGNOSIS — Z09 Encounter for follow-up examination after completed treatment for conditions other than malignant neoplasm: Secondary | ICD-10-CM | POA: Diagnosis not present

## 2024-06-14 DIAGNOSIS — I10 Essential (primary) hypertension: Secondary | ICD-10-CM | POA: Diagnosis not present

## 2024-06-14 DIAGNOSIS — Q453 Other congenital malformations of pancreas and pancreatic duct: Secondary | ICD-10-CM | POA: Diagnosis not present

## 2024-06-14 DIAGNOSIS — S81801D Unspecified open wound, right lower leg, subsequent encounter: Secondary | ICD-10-CM | POA: Diagnosis not present

## 2024-06-14 LAB — CULTURE, BLOOD (ROUTINE X 2)
Culture: NO GROWTH
Culture: NO GROWTH

## 2024-06-14 NOTE — Assessment & Plan Note (Signed)
 Continue antibiotics as prescribed No evidence of new infection Follow-up in 1 week to assess for any improvement of LLE Placed wet-to-dry dressing over right popliteal wound and placed DuoDERM over right buttock wound Placed referral to wound care clinic as well as home health RN for wound care, feel patient would benefit from closer attention given his mobility restrictions and difficult to reach area

## 2024-06-14 NOTE — Assessment & Plan Note (Addendum)
 BP above goal today, already on multiple medications Asymptomatic Patient reportedly only taking 50 mg twice daily of hydralazine , advised to increase to 75 mg 3 times daily as prescribed Reassess in 1 week Advised to follow-up with his nephrologist for further recommendations on BP control given that he has ESRD on dialysis

## 2024-06-14 NOTE — Progress Notes (Signed)
    SUBJECTIVE:   CHIEF COMPLAINT / HPI:   Hospital follow up  Admitted 7/18 - 7/20 for cellulitis of left lower extremity. Received IV abx. Discharged on keflex  Also noted to have large pressure ulcer on R buttock Underwent HD as scheduled Had persistent HTN - resumed on home hydralazine , diltiazem , losartan , toprol  XL  Per DC summary:  Recommendations at discharge:    Complete antibiotics (Cephalexin /Keflex  daily for 5 more days) Home health nursing ordered for wound care Continue Robaxin  as needed for leg pain  Go to dialysis as scheduled Follow up with Dr. Adele in 1-2 weeks Follow up with vascular surgery for wound care this week Stop smoking; nicotine  patch provided   Reports taking Keflex  as prescribed  Reports he has been changing his popliteal and buttock wound dressings alone at home No fevers, worsening drainage Difficult for him to do this because of his mobility issues and difficult to reach area, has no other help at home, requesting assistance with this  Reports he is taking 2 tablets of hydralazine  twice daily, otherwise taking medication as prescribed  Denies headache, vision changes, shortness of breath, chest pain   PERTINENT  PMH / PSH: HTN, ESRD on dialysis TThSa  OBJECTIVE:   BP (!) 170/85   Pulse 90   Temp 98.5 F (36.9 C) (Oral)   Ht 6' 5 (1.956 m)   Wt 243 lb (110.2 kg)   SpO2 99%   BMI 28.82 kg/m    General: NAD, pleasant, able to participate in exam Respiratory: No respiratory distress Psych: Normal affect and mood  RLE popliteal wound appreciated, appears similar to picture in chart from earlier No significant surrounding erythema of the skin or warmth Some brown drainage/dried blood noted on bandage  R buttock wound also appears similar to most recent picture in chart No significant surrounding erythema of the skin or warmth No significant drainage   LLE with erythema still within the bounds of the skin marker as in the photo  from 7/18 No significant blistering, drainage, warmth  ASSESSMENT/PLAN:   Assessment & Plan Cellulitis of lower extremity, unspecified laterality Hospital discharge follow-up Wound of right buttock, subsequent encounter Wound of right lower extremity, subsequent encounter Continue antibiotics as prescribed No evidence of new infection Follow-up in 1 week to assess for any improvement of LLE Placed wet-to-dry dressing over right popliteal wound and placed DuoDERM over right buttock wound Placed referral to wound care clinic as well as home health RN for wound care, feel patient would benefit from closer attention given his mobility restrictions and difficult to reach area Pancreatic abnormality Previously noted on MRCP in January, repeat MRI was advised after 6 months but patient missed his appointment due to being in the hospital.  This was rescheduled Severe hypertension BP above goal today, already on multiple medications Asymptomatic Patient reportedly only taking 50 mg twice daily of hydralazine , advised to increase to 75 mg 3 times daily as prescribed Reassess in 1 week Advised to follow-up with his nephrologist for further recommendations on BP control given that he has ESRD on dialysis   Payton Coward, MD [facility]

## 2024-06-14 NOTE — Patient Instructions (Addendum)
 Please ask your nephrologist about blood pressure medications  I have placed referrals for home health and wound care  Take hydralazine  75mg  three times daily  Follow up in 1 week after completing your antibiotics  Your MRI is scheduled

## 2024-06-15 ENCOUNTER — Other Ambulatory Visit: Payer: Self-pay

## 2024-06-15 DIAGNOSIS — N186 End stage renal disease: Secondary | ICD-10-CM | POA: Diagnosis not present

## 2024-06-15 DIAGNOSIS — D631 Anemia in chronic kidney disease: Secondary | ICD-10-CM | POA: Diagnosis not present

## 2024-06-15 DIAGNOSIS — N2581 Secondary hyperparathyroidism of renal origin: Secondary | ICD-10-CM | POA: Diagnosis not present

## 2024-06-15 DIAGNOSIS — E1122 Type 2 diabetes mellitus with diabetic chronic kidney disease: Secondary | ICD-10-CM | POA: Diagnosis not present

## 2024-06-15 DIAGNOSIS — D689 Coagulation defect, unspecified: Secondary | ICD-10-CM | POA: Diagnosis not present

## 2024-06-15 DIAGNOSIS — I132 Hypertensive heart and chronic kidney disease with heart failure and with stage 5 chronic kidney disease, or end stage renal disease: Secondary | ICD-10-CM | POA: Diagnosis not present

## 2024-06-15 DIAGNOSIS — Z992 Dependence on renal dialysis: Secondary | ICD-10-CM | POA: Diagnosis not present

## 2024-06-15 DIAGNOSIS — T8249XA Other complication of vascular dialysis catheter, initial encounter: Secondary | ICD-10-CM | POA: Diagnosis not present

## 2024-06-15 NOTE — Telephone Encounter (Signed)
 Received fax from [facility] on [address] for Tizanidine  2MG  tablets. I dont see this medication on current med list. If you want patient to start this medication. Please send Rx to pharmacy with dosage, duration and instructions. Nelson Land, CMA

## 2024-06-16 DIAGNOSIS — D631 Anemia in chronic kidney disease: Secondary | ICD-10-CM | POA: Diagnosis not present

## 2024-06-16 DIAGNOSIS — H9313 Tinnitus, bilateral: Secondary | ICD-10-CM | POA: Diagnosis not present

## 2024-06-16 DIAGNOSIS — H903 Sensorineural hearing loss, bilateral: Secondary | ICD-10-CM | POA: Diagnosis not present

## 2024-06-16 DIAGNOSIS — Z89511 Acquired absence of right leg below knee: Secondary | ICD-10-CM | POA: Diagnosis not present

## 2024-06-16 DIAGNOSIS — E1122 Type 2 diabetes mellitus with diabetic chronic kidney disease: Secondary | ICD-10-CM | POA: Diagnosis not present

## 2024-06-16 DIAGNOSIS — Z89412 Acquired absence of left great toe: Secondary | ICD-10-CM | POA: Diagnosis not present

## 2024-06-16 DIAGNOSIS — T8131XD Disruption of external operation (surgical) wound, not elsewhere classified, subsequent encounter: Secondary | ICD-10-CM | POA: Diagnosis not present

## 2024-06-16 DIAGNOSIS — I120 Hypertensive chronic kidney disease with stage 5 chronic kidney disease or end stage renal disease: Secondary | ICD-10-CM | POA: Diagnosis not present

## 2024-06-16 DIAGNOSIS — K610 Anal abscess: Secondary | ICD-10-CM | POA: Diagnosis not present

## 2024-06-16 DIAGNOSIS — E1165 Type 2 diabetes mellitus with hyperglycemia: Secondary | ICD-10-CM | POA: Diagnosis not present

## 2024-06-16 DIAGNOSIS — I97648 Postprocedural seroma of a circulatory system organ or structure following other circulatory system procedure: Secondary | ICD-10-CM | POA: Diagnosis not present

## 2024-06-16 DIAGNOSIS — N186 End stage renal disease: Secondary | ICD-10-CM | POA: Diagnosis not present

## 2024-06-17 DIAGNOSIS — E1122 Type 2 diabetes mellitus with diabetic chronic kidney disease: Secondary | ICD-10-CM | POA: Diagnosis not present

## 2024-06-17 DIAGNOSIS — D631 Anemia in chronic kidney disease: Secondary | ICD-10-CM | POA: Diagnosis not present

## 2024-06-17 DIAGNOSIS — N2581 Secondary hyperparathyroidism of renal origin: Secondary | ICD-10-CM | POA: Diagnosis not present

## 2024-06-17 DIAGNOSIS — D689 Coagulation defect, unspecified: Secondary | ICD-10-CM | POA: Diagnosis not present

## 2024-06-17 DIAGNOSIS — Z992 Dependence on renal dialysis: Secondary | ICD-10-CM | POA: Diagnosis not present

## 2024-06-17 DIAGNOSIS — T8249XA Other complication of vascular dialysis catheter, initial encounter: Secondary | ICD-10-CM | POA: Diagnosis not present

## 2024-06-17 DIAGNOSIS — I132 Hypertensive heart and chronic kidney disease with heart failure and with stage 5 chronic kidney disease, or end stage renal disease: Secondary | ICD-10-CM | POA: Diagnosis not present

## 2024-06-17 DIAGNOSIS — N186 End stage renal disease: Secondary | ICD-10-CM | POA: Diagnosis not present

## 2024-06-19 ENCOUNTER — Telehealth: Payer: Self-pay | Admitting: *Deleted

## 2024-06-19 NOTE — Telephone Encounter (Signed)
 Patient called and states his dressing is coming loose again and its still draining . Patient has appt schedule for tomorrow. Spoke with [place] PA patient is to apply guaze over the area and reinforce with an ace wrap. Keep appt scheduled for tomorrow. Instructions given to patient . Patient verbalized understanding.

## 2024-06-20 ENCOUNTER — Ambulatory Visit (HOSPITAL_COMMUNITY)
Admission: RE | Admit: 2024-06-20 | Discharge: 2024-06-20 | Disposition: A | Discharge status: Home / Self Care | Source: Ambulatory Visit | Attending: Vascular Surgery | Admitting: Vascular Surgery

## 2024-06-20 ENCOUNTER — Ambulatory Visit (INDEPENDENT_AMBULATORY_CARE_PROVIDER_SITE_OTHER): Admitting: Physician Assistant

## 2024-06-20 VITALS — BP 192/88 | HR 106 | Temp 98.2°F

## 2024-06-20 DIAGNOSIS — D631 Anemia in chronic kidney disease: Secondary | ICD-10-CM | POA: Diagnosis not present

## 2024-06-20 DIAGNOSIS — N186 End stage renal disease: Secondary | ICD-10-CM | POA: Diagnosis not present

## 2024-06-20 DIAGNOSIS — Z992 Dependence on renal dialysis: Secondary | ICD-10-CM | POA: Insufficient documentation

## 2024-06-20 DIAGNOSIS — N185 Chronic kidney disease, stage 5: Secondary | ICD-10-CM | POA: Insufficient documentation

## 2024-06-20 DIAGNOSIS — I132 Hypertensive heart and chronic kidney disease with heart failure and with stage 5 chronic kidney disease, or end stage renal disease: Secondary | ICD-10-CM | POA: Diagnosis not present

## 2024-06-20 DIAGNOSIS — M25561 Pain in right knee: Secondary | ICD-10-CM | POA: Diagnosis not present

## 2024-06-20 DIAGNOSIS — T8249XA Other complication of vascular dialysis catheter, initial encounter: Secondary | ICD-10-CM | POA: Diagnosis not present

## 2024-06-20 DIAGNOSIS — Q453 Other congenital malformations of pancreas and pancreatic duct: Secondary | ICD-10-CM | POA: Diagnosis not present

## 2024-06-20 DIAGNOSIS — N2581 Secondary hyperparathyroidism of renal origin: Secondary | ICD-10-CM | POA: Diagnosis not present

## 2024-06-20 DIAGNOSIS — R262 Difficulty in walking, not elsewhere classified: Secondary | ICD-10-CM | POA: Diagnosis not present

## 2024-06-20 DIAGNOSIS — D689 Coagulation defect, unspecified: Secondary | ICD-10-CM | POA: Diagnosis not present

## 2024-06-20 DIAGNOSIS — E1122 Type 2 diabetes mellitus with diabetic chronic kidney disease: Secondary | ICD-10-CM | POA: Diagnosis not present

## 2024-06-20 NOTE — Progress Notes (Signed)
 POST OPERATIVE OFFICE NOTE    CC:  F/u for surgery  HPI:  This is a 53 y.o. male who is s/p second stage basilic vein transposition.  He developed a seroma postoperatively and was brought back to the operating room on 05/17/2024 for irrigation and debridement with incisional VAC placement.  This was removed after a week however he continued to drain serous fluid from the incision.  A peel and place VAC with home pump was arranged for him.  This was placed in the office on 05/24/2024.  He has returned on a weekly basis for peel and place VAC changes.  He denies steal symptoms in his left hand.  He is dialyzing from his [facility].  He is an everyday tobacco user.  He is an uncontrolled diabetic.  Allergies  Allergen Reactions   Iron  Nausea And Vomiting    With the iron  tablets   Tape Rash    Rash at site of tape--okay to use paper tape    Current Outpatient Medications  Medication Sig Dispense Refill   Accu-Chek Softclix Lancets lancets Use to check blood sugar twice per day. E11.9 100 each 12   albuterol  (VENTOLIN  HFA) 108 (90 Base) MCG/ACT inhaler Inhale 2 puffs into the lungs every 6 (six) hours as needed for wheezing or shortness of breath. 8 g 2   atorvastatin  (LIPITOR) 40 MG tablet TAKE 1 TABLET(40 MG) BY MOUTH AT BEDTIME 90 tablet 0   Blood Glucose Monitoring Suppl (ACCU-CHEK GUIDE ME) w/Device KIT See admin instructions.     calcitRIOL  (ROCALTROL ) 0.25 MCG capsule Take 1 capsule (0.25 mcg total) by mouth Every Tuesday,Thursday,and Saturday with dialysis. 30 capsule 0   Calcium  Acetate 667 MG TABS Take 1,334 mg by mouth 3 (three) times daily.     diltiazem  (CARDIZEM  CD) 300 MG 24 hr capsule TAKE 1 CAPSULE(300 MG) BY MOUTH DAILY 90 capsule 0   FLUoxetine  (PROZAC ) 20 MG capsule TAKE 1 CAPSULE(20 MG) BY MOUTH DAILY 90 capsule 2   fluticasone  (FLONASE ) 50 MCG/ACT nasal spray Place 2 sprays into both nostrils daily. 16 g 6   Fluticasone -Umeclidin-Vilant (TRELEGY ELLIPTA ) 100-62.5-25 MCG/ACT AEPB  INHALE 1 PUFF INTO LUNGS DAILY 60 each 3   gabapentin  (NEURONTIN ) 300 MG capsule Take 1 capsule (300 mg total) by mouth daily. 30 capsule 0   glucose blood (AGAMATRIX PRESTO TEST) test strip Check blood sugars twice daily before meals 100 each 12   glucose blood test strip 1 each by Other route as needed for other. Use as instructed     hydrALAZINE  (APRESOLINE ) 25 MG tablet Take 75 mg by mouth 3 (three) times daily.     insulin  lispro (HUMALOG ) 100 UNIT/ML injection 0-6 Units, Subcutaneous, 3 times daily with meals, First dose on Sun 10/17/23 at 0800 Correction coverage: Very Sensitive (ESRD/Dialysis) CBG < 70: Implement Hypoglycemia Standing Orders and refer to Hypoglycemia Standing Orders sidebar report CBG 70 - 120: 0 units CBG 121 - 150: 0 units CBG 151 - 200: 1 unit CBG 201-250: 2 units CBG 251-300: 3 units CBG 301-350: 4 units CBG 351-400: 5 units CBG > 400: Give 6 units and call MD (Patient taking differently: Inject into the skin 2 (two) times daily. 0-6 Units, Subcutaneous, 3 times daily with meals, as needed  Correction coverage: Very Sensitive (ESRD/Dialysis) CBG < 70: Implement Hypoglycemia Standing Orders and refer to Hypoglycemia Standing Orders sidebar report CBG 70 - 120: 0 units CBG 121 - 150: 0 units CBG 151 - 200: 1 unit CBG  201-250: 2 units CBG 251-300: 3 units CBG 301-350: 4 units CBG 351-400: 5 units CBG > 400: Give 6 units and call MD)     loratadine  (CLARITIN ) 10 MG tablet Take 1 tablet (10 mg total) by mouth daily. 30 tablet 11   losartan  (COZAAR ) 25 MG tablet TAKE 1 TABLET(25 MG) BY MOUTH DAILY 90 tablet 0   methocarbamol  (ROBAXIN ) 750 MG tablet Take 1 tablet (750 mg total) by mouth every 8 (eight) hours as needed for muscle spasms. 30 tablet 0   metoprolol  succinate (TOPROL -XL) 25 MG 24 hr tablet Take 25 mg by mouth daily. (Patient not taking: Reported on 06/09/2024)     nicotine  (NICODERM CQ  - DOSED IN MG/24 HOURS) 14 mg/24hr patch Place 1 patch (14 mg total)  onto the skin daily. 28 patch 0   ondansetron  (ZOFRAN -ODT) 4 MG disintegrating tablet Take 1 tablet (4 mg total) by mouth every 8 (eight) hours as needed for nausea or vomiting. 20 tablet 0   pantoprazole  (PROTONIX ) 40 MG tablet TAKE 1 TABLET(40 MG) BY MOUTH DAILY 90 tablet 0   No current facility-administered medications for this visit.     ROS:  See HPI  Physical Exam:  Vitals:   06/20/24 1347  BP: (!) 192/88  Pulse: (!) 106  Temp: 98.2 F (36.8 C)  TempSrc: Temporal    Incision: No active drainage noted on exam today Extremities: Palpable left radial pulse; palpable thrill through the fistula Neuro: A&O  Assessment/Plan:  This is a 53 y.o. male who is s/p: Left arm second stage basilic vein fistula requiring subsequent irrigation and debridement due to postoperative seroma  After several weeks of peel and place VAC changes the incision has only demonstrated minimal improvement.  Given his end-stage renal disease, uncontrolled diabetes, and daily heavy tobacco use, he has been unable to heal an arm incision over the course of 5 weeks.  When asked if he can stop smoking or cut back during wound healing, he continues to be unwilling.  He has completed a 2-week course of doxycycline .  There is no sign of infection.  We will again try daily and as needed dressing changes with gauze and Ace wrap.  He was provided with supplies today.  He will return to office in a few weeks for incision check and likely suture removal.  He will continue HD via [facility] for now.  I have also discussed and encouraged nutrition and exercise.  He is not a candidate for [facility] RN due to his ability to drive.   Donnice Sender, PA-C [facility] [phone]  Clinic MD:  [place]

## 2024-06-21 ENCOUNTER — Ambulatory Visit (HOSPITAL_COMMUNITY)
Admission: RE | Admit: 2024-06-21 | Discharge: 2024-06-21 | Disposition: A | Discharge status: Home / Self Care | Source: Ambulatory Visit | Attending: Family Medicine | Admitting: Family Medicine

## 2024-06-21 ENCOUNTER — Other Ambulatory Visit: Payer: Self-pay | Admitting: Family Medicine

## 2024-06-21 DIAGNOSIS — Z992 Dependence on renal dialysis: Secondary | ICD-10-CM | POA: Diagnosis not present

## 2024-06-21 DIAGNOSIS — Q453 Other congenital malformations of pancreas and pancreatic duct: Secondary | ICD-10-CM | POA: Diagnosis not present

## 2024-06-21 DIAGNOSIS — K8020 Calculus of gallbladder without cholecystitis without obstruction: Secondary | ICD-10-CM | POA: Diagnosis not present

## 2024-06-21 DIAGNOSIS — N185 Chronic kidney disease, stage 5: Secondary | ICD-10-CM | POA: Diagnosis not present

## 2024-06-21 DIAGNOSIS — N186 End stage renal disease: Secondary | ICD-10-CM | POA: Diagnosis not present

## 2024-06-21 DIAGNOSIS — J90 Pleural effusion, not elsewhere classified: Secondary | ICD-10-CM | POA: Diagnosis not present

## 2024-06-21 DIAGNOSIS — N281 Cyst of kidney, acquired: Secondary | ICD-10-CM | POA: Diagnosis not present

## 2024-06-22 ENCOUNTER — Ambulatory Visit (INDEPENDENT_AMBULATORY_CARE_PROVIDER_SITE_OTHER): Admitting: Gastroenterology

## 2024-06-22 ENCOUNTER — Encounter: Payer: Self-pay | Admitting: Gastroenterology

## 2024-06-22 ENCOUNTER — Encounter (HOSPITAL_BASED_OUTPATIENT_CLINIC_OR_DEPARTMENT_OTHER): Attending: Internal Medicine | Admitting: Internal Medicine

## 2024-06-22 ENCOUNTER — Encounter (HOSPITAL_COMMUNITY): Payer: Self-pay

## 2024-06-22 VITALS — BP 160/68 | HR 71 | Ht 77.0 in | Wt 246.8 lb

## 2024-06-22 DIAGNOSIS — S81002A Unspecified open wound, left knee, initial encounter: Secondary | ICD-10-CM | POA: Diagnosis not present

## 2024-06-22 DIAGNOSIS — Y839 Surgical procedure, unspecified as the cause of abnormal reaction of the patient, or of later complication, without mention of misadventure at the time of the procedure: Secondary | ICD-10-CM | POA: Insufficient documentation

## 2024-06-22 DIAGNOSIS — Z992 Dependence on renal dialysis: Secondary | ICD-10-CM | POA: Diagnosis not present

## 2024-06-22 DIAGNOSIS — E11622 Type 2 diabetes mellitus with other skin ulcer: Secondary | ICD-10-CM | POA: Diagnosis not present

## 2024-06-22 DIAGNOSIS — D631 Anemia in chronic kidney disease: Secondary | ICD-10-CM | POA: Diagnosis not present

## 2024-06-22 DIAGNOSIS — T8131XA Disruption of external operation (surgical) wound, not elsewhere classified, initial encounter: Secondary | ICD-10-CM | POA: Diagnosis not present

## 2024-06-22 DIAGNOSIS — L89313 Pressure ulcer of right buttock, stage 3: Secondary | ICD-10-CM | POA: Diagnosis not present

## 2024-06-22 DIAGNOSIS — L97112 Non-pressure chronic ulcer of right thigh with fat layer exposed: Secondary | ICD-10-CM | POA: Diagnosis not present

## 2024-06-22 DIAGNOSIS — D689 Coagulation defect, unspecified: Secondary | ICD-10-CM | POA: Diagnosis not present

## 2024-06-22 DIAGNOSIS — L0231 Cutaneous abscess of buttock: Secondary | ICD-10-CM | POA: Insufficient documentation

## 2024-06-22 DIAGNOSIS — Z860101 Personal history of adenomatous and serrated colon polyps: Secondary | ICD-10-CM | POA: Diagnosis not present

## 2024-06-22 DIAGNOSIS — D638 Anemia in other chronic diseases classified elsewhere: Secondary | ICD-10-CM

## 2024-06-22 DIAGNOSIS — N186 End stage renal disease: Secondary | ICD-10-CM | POA: Diagnosis not present

## 2024-06-22 DIAGNOSIS — I129 Hypertensive chronic kidney disease with stage 1 through stage 4 chronic kidney disease, or unspecified chronic kidney disease: Secondary | ICD-10-CM | POA: Diagnosis not present

## 2024-06-22 DIAGNOSIS — T8249XA Other complication of vascular dialysis catheter, initial encounter: Secondary | ICD-10-CM | POA: Diagnosis not present

## 2024-06-22 DIAGNOSIS — I509 Heart failure, unspecified: Secondary | ICD-10-CM | POA: Diagnosis not present

## 2024-06-22 DIAGNOSIS — Z860100 Personal history of colon polyps, unspecified: Secondary | ICD-10-CM

## 2024-06-22 DIAGNOSIS — I1311 Hypertensive heart and chronic kidney disease without heart failure, with stage 5 chronic kidney disease, or end stage renal disease: Secondary | ICD-10-CM | POA: Diagnosis not present

## 2024-06-22 DIAGNOSIS — I132 Hypertensive heart and chronic kidney disease with heart failure and with stage 5 chronic kidney disease, or end stage renal disease: Secondary | ICD-10-CM | POA: Diagnosis not present

## 2024-06-22 DIAGNOSIS — X58XXXA Exposure to other specified factors, initial encounter: Secondary | ICD-10-CM | POA: Insufficient documentation

## 2024-06-22 DIAGNOSIS — D649 Anemia, unspecified: Secondary | ICD-10-CM | POA: Diagnosis not present

## 2024-06-22 DIAGNOSIS — E1122 Type 2 diabetes mellitus with diabetic chronic kidney disease: Secondary | ICD-10-CM | POA: Diagnosis not present

## 2024-06-22 DIAGNOSIS — N2581 Secondary hyperparathyroidism of renal origin: Secondary | ICD-10-CM | POA: Diagnosis not present

## 2024-06-22 MED ORDER — PEG 3350-KCL-NA BICARB-NACL 420 G PO SOLR: 4000.0000 mL | Freq: Once | ORAL | 0 refills | Status: AC

## 2024-06-22 NOTE — Patient Instructions (Addendum)
 You have been scheduled for a colonoscopy. Please follow written instructions given to you at your visit today.   If you use inhalers (even only as needed), please bring them with you on the day of your procedure.  DO NOT TAKE 7 DAYS PRIOR TO TEST- Trulicity  (dulaglutide ) Ozempic, Wegovy (semaglutide) Mounjaro (tirzepatide) Bydureon Bcise (exanatide extended release)  DO NOT TAKE 1 DAY PRIOR TO YOUR TEST Rybelsus (semaglutide) Adlyxin (lixisenatide) Victoza (liraglutide) Byetta (exanatide) ___________________________________________________________________________  _______________________________________________________  Call your endocrinologist in regards to your insulin  pump instructions prior to your procedure.   If your blood pressure at your visit was 140/90 or greater, please contact your primary care physician to follow up on this.  _______________________________________________________  If you are age 60 or older, your body mass index should be between 23-30. Your Body mass index is 29.27 kg/m. If this is out of the aforementioned range listed, please consider follow up with your Primary Care Provider.  If you are age 8 or younger, your body mass index should be between 19-25. Your Body mass index is 29.27 kg/m. If this is out of the aformentioned range listed, please consider follow up with your Primary Care Provider.   ________________________________________________________  The [facility] providers would like to encourage you to use MYCHART to communicate with providers for non-urgent requests or questions.  Due to long hold times on the telephone, sending your provider a message by [facility] may be a faster and more efficient way to get a response.  Please allow 48 business hours for a response.  Please remember that this is for non-urgent requests.  _______________________________________________________  [facility] is using a team-based approach to  care.  Your team is made up of your doctor and two to three APPS. Our APPS (Nurse Practitioners and Physician Assistants) work with your physician to ensure care continuity for you. They are fully qualified to address your health concerns and develop a treatment plan. They communicate directly with your gastroenterologist to care for you. Seeing the Advanced Practice Practitioners on your physician's team can help you by facilitating care more promptly, often allowing for earlier appointments, access to diagnostic testing, procedures, and other specialty referrals.    Thank you for trusting me with your gastrointestinal care!    Dr. Victory Legrand [facility]

## 2024-06-22 NOTE — Progress Notes (Signed)
 [facility] Progress Note  Chief Complaint: Anemia and history of colon polyps  Summary of GI history:  Summary from last office visit May 2025 Patient of Dr. Legrand, seen October 2022 after hospitalization for protracted vomiting.  Inpatient EGD by Dr. Federico showed severe esophagitis. [name] has longstanding insulin  requiring diabetes with progressive nephropathy, neuropathy, vascular disease with prior BKA and gastropathy/gastroparesis.  Insufficient response to renal dosing of metoclopramide . Inpatient colonoscopy for heme positive stool in 2020, poor prep.  Outpatient colonoscopy July 2021 (2-day prep with Suprep/MiraLAX ) colon prep initially fair, improved with lavage.  3 subcentimeter tubular adenomas removed, 3-year recall recommended. He was hospitalized in March of this year for progressive dyspnea, tachycardia and acute on chronic anemia.  Dr. Hassan consult note was reviewed, anemia was felt most likely due to his CKD.  No overt GI bleeding, no stool testing for occult blood done, iron  studies were normal.  Inpatient EGD by Dr. Leigh discovered healing of the previously discovered esophagitis, essentially no source of upper GI blood loss.  It was felt that colonoscopy would likely be of low yield regarding the anemia, and that a 2-day bowel preparation difficult for the patient to undergo.   I felt the complexity of [name] [name]'s medical problems as well as his chronic nausea and vomiting issues precluded him from being able to do it necessary to date bowel preparation for colonoscopy.  He would also be at high risk for severe hyper or hypoglycemia during the bowel preparation due to his brittle diabetes.  His worsening anemia was due to his worsening CKD in my opinion.   Admitted to the hospital early March 2025 and seen by APP and Dr. Shila in consultation for recurrent episode of nausea vomiting upper abdominal pain in the setting of accelerated hypertension.  Some  imaging findings noted below.  He was reportedly treated with IV metoclopramide  during that hospitalization.  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 Hospitalized in the [facility] early July 2025 for perianal abscess requiring incision and drainage and antibiotics Hospitalized at [facility] 06/09/2024 for chest pressure and dyspnea found to have volume overload and small bilateral pleural effusions as well as lower extremity cellulitis. __________________   [name] [name] was here for follow-up of the issues described at his last visit with me.  He has intermittent vomiting feels it is generally manageable and under control with as needed medicine.  He has an insulin  pump and doses additional Humalog  when needed for hyperglycemia.   Denies black or bloody stool.  He feels as if he has recovered from his most recent hospital illnesses.  However, he still needs to have dialysis through his Hickman catheter because of poor wound healing on his AV fistula.  He does not currently know what the long-term plan for that is.  ROS: Cardiovascular:  no chest pain Respiratory: Chronic dyspnea with exertion Fatigue Depression Remainder of systems negative except as above  The patient's Past Medical, Family and Social History were reviewed and are on file in the EMR. Past Medical History:  Diagnosis Date   Abdominal pain 11/30/2023   Abdominal tenderness  10/14/2023   Allergy    seasonal and environmental   Anemia    hx blood transfusion 04/27/24   Chest pain 12/01/2023   Chronic constipation 05/13/2020    Depression    Diabetes mellitus without complication (HCC) 2019   Dyspnea    hx - SOB with exertion   ESRD on hemodialysis (HCC)    Tues, Thurs, Sat   GERD (gastroesophageal reflux disease)    Hyperlipidemia    Hypertension    Necrotizing fasciitis (HCC) 10/13/2017   Neuromuscular disorder (HCC)    neuropathy   PAD (peripheral artery disease) (HCC)    Secondary hyperparathyroidism (HCC)    SVT (supraventricular tachycardia) (HCC)    hx   Wound dehiscence 11/24/2017    Past Surgical History:  Procedure Laterality Date   A/V FISTULAGRAM N/A 03/17/2024   Procedure: A/V Fistulagram;  Surgeon: Magda Debby SAILOR, MD;  Location: [facility] INVASIVE [facility] LAB;  Service: Cardiovascular;  Laterality: N/A;   AMPUTATION Right 10/13/2017   Procedure: RIGHT BELOW KNEE AMPUTATION;  Surgeon: Harden Jerona GAILS, MD;  Location: [facility] OR;  Service: Orthopedics;  Laterality: Right;   AMPUTATION Right 11/24/2017   Procedure: AMPUTATION BELOW KNEE REVISION;  Surgeon: Harden Jerona GAILS, MD;  Location: [facility] OR;  Service: Orthopedics;  Laterality: Right;   AMPUTATION TOE Left    AV FISTULA PLACEMENT Left 10/19/2023   Procedure: LEFT BRACHIOCEPHALIC ARTERIOVENOUS (AV) FISTULA CREATION;  Surgeon: Pearline Norman RAMAN, MD;  Location: [facility] OR;  Service: Vascular;  Laterality: Left;   AV FISTULA PLACEMENT Left 01/26/2024   Procedure: LEFT ARTERIOVENOUS (AV) FISTULA CREATION;  Surgeon: Pearline Norman RAMAN, MD;  Location: [facility] OR;  Service: Vascular;  Laterality: Left;   BASCILIC VEIN TRANSPOSITION Left 04/26/2024   Procedure: LEFT SECOND STAGE TRANSPOSITION, VEIN, BASILIC;  Surgeon: Magda Debby SAILOR, MD;  Location: [facility] OR;  Service: Vascular;  Laterality: Left;   BIOPSY  09/02/2021   Procedure: BIOPSY;  Surgeon: Federico Rosario BROCKS, MD;  Location: [facility] ENDOSCOPY;  Service: Gastroenterology;;   BIOPSY  02/18/2023   Procedure: BIOPSY;  Surgeon: Leigh Elspeth SQUIBB, MD;  Location: [facility] ENDOSCOPY;  Service: Gastroenterology;;   COLONOSCOPY  05/11/2019    COLONOSCOPY  2021   3 polyps - tubular adenomas   ESOPHAGOGASTRODUODENOSCOPY (EGD) WITH PROPOFOL  N/A 09/02/2021   Procedure: ESOPHAGOGASTRODUODENOSCOPY (EGD) WITH PROPOFOL ;  Surgeon: Federico Rosario BROCKS, MD;  Location: [facility] ENDOSCOPY;  Service: Gastroenterology;  Laterality: N/A;   ESOPHAGOGASTRODUODENOSCOPY (EGD) WITH PROPOFOL  N/A 02/18/2023   Procedure: ESOPHAGOGASTRODUODENOSCOPY (EGD) WITH PROPOFOL ;  Surgeon: Leigh Elspeth SQUIBB, MD;  Location: [facility] ENDOSCOPY;  Service: Gastroenterology;  Laterality: N/A;   INCISION AND DRAINAGE OF WOUND Left 05/17/2024   Procedure: IRRIGATION AND DEBRIDEMENT WOUND;  Surgeon: Serene Gaile ORN, MD;  Location: [facility] OR;  Service: Vascular;  Laterality: Left;  Arm   INTRAMEDULLARY (IM) NAIL INTERTROCHANTERIC Right 10/15/2023   Procedure: INTRAMEDULLARY (IM) NAIL INTERTROCHANTERIC;  Surgeon: Kendal Franky SQUIBB, MD;  Location: [facility] OR;  Service: Orthopedics;  Laterality: Right;   IR FLUORO GUIDE CV LINE RIGHT  10/15/2023   IR US  GUIDE VASC ACCESS RIGHT  10/15/2023   POLYPECTOMY     WISDOM TOOTH EXTRACTION      Objective:  Med list reviewed  Current Outpatient Medications:    Accu-Chek Softclix Lancets lancets, Use to check blood sugar twice per day. E11.9, Disp: 100 each, Rfl: 12   albuterol  (VENTOLIN  HFA) 108 (90 Base) MCG/ACT inhaler, Inhale 2 puffs into the lungs every 6 (six) hours as needed for wheezing or  shortness of breath., Disp: 8 g, Rfl: 2   atorvastatin  (LIPITOR) 40 MG tablet, TAKE 1 TABLET(40 MG) BY MOUTH AT BEDTIME, Disp: 90 tablet, Rfl: 0   Blood Glucose Monitoring Suppl (ACCU-CHEK GUIDE ME) w/Device KIT, See admin instructions., Disp: , Rfl:    calcitRIOL  (ROCALTROL ) 0.25 MCG capsule, Take 1 capsule (0.25 mcg total) by mouth Every Tuesday,Thursday,and Saturday with dialysis., Disp: 30 capsule, Rfl: 0   Calcium  Acetate 667 MG TABS, Take 1,334 mg by mouth 3 (three) times daily., Disp: , Rfl:    diltiazem  (CARDIZEM  CD) 300 MG 24 hr capsule, TAKE 1 CAPSULE(300  MG) BY MOUTH DAILY, Disp: 90 capsule, Rfl: 0   FLUoxetine  (PROZAC ) 20 MG capsule, TAKE 1 CAPSULE(20 MG) BY MOUTH DAILY, Disp: 90 capsule, Rfl: 2   fluticasone  (FLONASE ) 50 MCG/ACT nasal spray, Place 2 sprays into both nostrils daily., Disp: 16 g, Rfl: 6   Fluticasone -Umeclidin-Vilant (TRELEGY ELLIPTA ) 100-62.5-25 MCG/ACT AEPB, INHALE 1 PUFF INTO LUNGS DAILY, Disp: 60 each, Rfl: 3   gabapentin  (NEURONTIN ) 300 MG capsule, Take 1 capsule (300 mg total) by mouth daily., Disp: 30 capsule, Rfl: 0   glucose blood (AGAMATRIX PRESTO TEST) test strip, Check blood sugars twice daily before meals, Disp: 100 each, Rfl: 12   glucose blood test strip, 1 each by Other route as needed for other. Use as instructed, Disp: , Rfl:    hydrALAZINE  (APRESOLINE ) 25 MG tablet, Take 75 mg by mouth 3 (three) times daily., Disp: , Rfl:    insulin  lispro (HUMALOG ) 100 UNIT/ML injection, 0-6 Units, Subcutaneous, 3 times daily with meals, First dose on Sun 10/17/23 at 0800 Correction coverage: Very Sensitive (ESRD/Dialysis) CBG < 70: Implement Hypoglycemia Standing Orders and refer to Hypoglycemia Standing Orders sidebar report CBG 70 - 120: 0 units CBG 121 - 150: 0 units CBG 151 - 200: 1 unit CBG 201-250: 2 units CBG 251-300: 3 units CBG 301-350: 4 units CBG 351-400: 5 units CBG > 400: Give 6 units and call MD (Patient taking differently: Inject into the skin 2 (two) times daily. 0-6 Units, Subcutaneous, 3 times daily with meals, as needed  Correction coverage: Very Sensitive (ESRD/Dialysis) CBG < 70: Implement Hypoglycemia Standing Orders and refer to Hypoglycemia Standing Orders sidebar report CBG 70 - 120: 0 units CBG 121 - 150: 0 units CBG 151 - 200: 1 unit CBG 201-250: 2 units CBG 251-300: 3 units CBG 301-350: 4 units CBG 351-400: 5 units CBG > 400: Give 6 units and call MD), Disp: , Rfl:    loratadine  (CLARITIN ) 10 MG tablet, Take 1 tablet (10 mg total) by mouth daily., Disp: 30 tablet, Rfl: 11   losartan  (COZAAR ) 25 MG tablet,  TAKE 1 TABLET(25 MG) BY MOUTH DAILY, Disp: 90 tablet, Rfl: 0   methocarbamol  (ROBAXIN ) 750 MG tablet, Take 1 tablet (750 mg total) by mouth every 8 (eight) hours as needed for muscle spasms., Disp: 30 tablet, Rfl: 0   metoprolol  succinate (TOPROL -XL) 25 MG 24 hr tablet, Take 25 mg by mouth daily., Disp: , Rfl:    nicotine  (NICODERM CQ  - DOSED IN MG/24 HOURS) 14 mg/24hr patch, Place 1 patch (14 mg total) onto the skin daily., Disp: 28 patch, Rfl: 0   ondansetron  (ZOFRAN -ODT) 4 MG disintegrating tablet, Take 1 tablet (4 mg total) by mouth every 8 (eight) hours as needed for nausea or vomiting., Disp: 20 tablet, Rfl: 0   pantoprazole  (PROTONIX ) 40 MG tablet, TAKE 1 TABLET(40 MG) BY MOUTH DAILY, Disp: 90 tablet, Rfl: 0  polyethylene glycol-electrolytes (NULYTELY ) 420 g solution, Take 4,000 mLs by mouth once for 1 dose., Disp: 4000 mL, Rfl: 0   Vital signs in last 24 hrs: Vitals:   06/22/24 1416  BP: (!) 160/68  Pulse: 71   Wt Readings from Last 3 Encounters:  06/22/24 246 lb 12.8 oz (111.9 kg)  06/14/24 243 lb (110.2 kg)  06/10/24 234 lb 5.6 oz (106.3 kg)    Physical Exam   HEENT: sclera anicteric, oral mucosa moist without lesions Neck: supple, no thyromegaly, JVD or lymphadenopathy Cardiac: Regular without appreciable murmur,  no peripheral edema.  Right lower leg prosthesis Pulm: clear to auscultation bilaterally, normal RR and effort noted Abdomen: soft, no tenderness, with active bowel sounds. No guarding or palpable hepatosplenomegaly. Skin; warm and dry, no jaundice or rash.  Scaly skin on lower extremity as before Bandage over his left arm AV fistula  Labs:     Latest Ref Rng & Units 06/10/2024    4:20 AM 06/09/2024    3:59 PM 05/20/2024   12:31 PM  CBC  WBC 4.0 - 10.5 K/uL 9.7  8.4  8.9   Hemoglobin 13.0 - 17.0 g/dL 8.0  8.7  9.1   Hematocrit 39.0 - 52.0 % 27.6  29.3  30.5   Platelets 150 - 400 K/uL 149  154  141       Latest Ref Rng & Units 06/11/2024    4:31 AM  06/10/2024    4:20 AM 06/09/2024    3:59 PM  CMP  Glucose 70 - 99 mg/dL 783  804  714   BUN 6 - 20 mg/dL 41  43  35   Creatinine 0.61 - 1.24 mg/dL 3.50  2.42  3.30   Sodium 135 - 145 mmol/L 134  138  138   Potassium 3.5 - 5.1 mmol/L 5.3  4.5  4.1   Chloride 98 - 111 mmol/L 101  105  101   CO2 22 - 32 mmol/L 23  24  23    Calcium  8.9 - 10.3 mg/dL 8.0  7.6  8.3     ___________________________________________ Radiologic studies: CLINICAL DATA:  Chest pain since last week   EXAM: CHEST - 2 VIEW   COMPARISON:  03/26/2024 from [facility], without report. A chest CT of 04/13/2024 is also reviewed.   FINDINGS: Hyperinflation. Right dialysis catheter tips poorly visualized, likely mid right atrium.   Midline trachea. Normal heart size. Small left and trace right pleural effusions. No pneumothorax. Mild pulmonary venous congestion. Bibasilar airspace disease.   IMPRESSION: 1. Small left and trace right pleural effusions with bibasilar airspace disease, favoring atelectasis. 2. Mild pulmonary venous congestion.     Electronically Signed   By: Rockey Kilts M.D.   On: 06/09/2024 16:46  ____________________________________________ Other:   _____________________________________________ Assessment & Plan  Assessment: Encounter Diagnoses  Name Primary?   Anemia of chronic disease Yes   Hx of colonic polyps    [name] [name] has ongoing multiple medical illnesses related to his diabetes and vascular disease. He has a chronic anemia that is multifactorial and I believe probably not from ongoing GI blood loss. History of colon polyps in 2020 and 2021 overdue for surveillance.  Fair initial prep on last exam and requires GoLytely  for next exam.  Today he looks as well as I have seen him over the years, and we had a discussion about whether or not he feels up to undergoing a bowel preparation and colonoscopy.  He says he can do it  and manage his dialysis around that and his insulin  pump  with the aid of his endocrinologist.  So we have him on my schedule for mid October at [facility] to undergo a routine polyp surveillance colonoscopy. That date is a Monday, which will fit him with his current Tuesday Thursday Saturday dialysis schedule.  He was given usual preprocedure prep instructions, and also instructed to contact his endocrinologist about a week prior to the procedure for advice on how to manage his insulin  pump particularly the day before when he is on a clear liquid diet.   The benefits and risks of the planned procedure(s) were described in detail with the patient or (when appropriate) their health care proxy.  Risks were outlined as including, but not limited to, bleeding, infection, perforation, adverse medication reaction leading to cardiac or pulmonary decompensation, pancreatitis (if ERCP).  The limitation of incomplete mucosal visualization was also discussed.  No guarantees or warranties were given.  Patient at increased risk for cardiopulmonary complications of procedure due to medical comorbidities.    [name] [name]

## 2024-06-23 ENCOUNTER — Telehealth: Payer: Self-pay

## 2024-06-23 ENCOUNTER — Ambulatory Visit: Admitting: Family Medicine

## 2024-06-23 ENCOUNTER — Encounter: Payer: Self-pay | Admitting: Family Medicine

## 2024-06-23 VITALS — BP 186/99 | HR 89 | Ht 77.0 in | Wt 243.4 lb

## 2024-06-23 DIAGNOSIS — I10 Essential (primary) hypertension: Secondary | ICD-10-CM

## 2024-06-23 DIAGNOSIS — Q453 Other congenital malformations of pancreas and pancreatic duct: Secondary | ICD-10-CM | POA: Diagnosis not present

## 2024-06-23 DIAGNOSIS — L03119 Cellulitis of unspecified part of limb: Secondary | ICD-10-CM

## 2024-06-23 DIAGNOSIS — S31819D Unspecified open wound of right buttock, subsequent encounter: Secondary | ICD-10-CM

## 2024-06-23 MED ORDER — HYDROCODONE-ACETAMINOPHEN 5-325 MG PO TABS: 1.0000 | ORAL_TABLET | Freq: Four times a day (QID) | ORAL | 0 refills | Status: DC | PRN

## 2024-06-23 MED ORDER — HYDROCODONE-ACETAMINOPHEN 5-325 MG PO TABS
1.0000 | ORAL_TABLET | Freq: Four times a day (QID) | ORAL | 0 refills | Status: DC | PRN
Start: 2024-06-23 — End: 2024-07-06

## 2024-06-23 NOTE — Progress Notes (Signed)
    SUBJECTIVE:   CHIEF COMPLAINT / HPI:   Follow-up for cellulitis of lower extremity Seen by me 7/23 at which time he was completing his antibiotic course Patient seen by [facility] on 7/29, was provided with dressing supplies, advised to follow-up in a few weeks with [facility] for incision check and likely suture removal Seen by GI yesterday 7/31, planning for colonoscopy in October   No recent fevers LLE redness is about the same if not a little better compared to last visit, has not worsened Reports has one remaining dose of abx Still having persistent pain especially with buttock wound, tylenol  has not helped, had some relief with norco  Had MRCP done, shows persistent pancreatic cystic lesions with no new lesions identified, f/u MRI/MRCP recommended in 6 months with pre and post contrast  PERTINENT  PMH / PSH: HTN, ESRD on HD TThSa  OBJECTIVE:   BP (!) 186/99   Pulse 89   Ht 6' 5 (1.956 m)   Wt 243 lb 6.4 oz (110.4 kg)   SpO2 100%   BMI 28.86 kg/m   General: NAD, pleasant, able to participate in exam Respiratory: No respiratory distress Skin: warm and dry, LLE erythema appears stable/slightly improved compared to prior Psych: Normal affect and mood    ASSESSMENT/PLAN:   Assessment & Plan Cellulitis of lower extremity, unspecified laterality Stable, improving, low suspicion for recurrent infection Continue f/u with [facility] Routine f/u with PCP Pancreatic abnormality F/u MRI/MRCP with pre and post contrast in 6 months Severe hypertension Uncontrolled, asymptomatic, took BP meds today Unclear exactly what dose he is taking of hydralazine  Scheduled for f/u appt on 8/6, advised to bring all of his medications with him for review Also advised to bring it up with his nephrologist at dialysis tomorrow/Tuesday Wound of right buttock, subsequent encounter Sent refill of 5 day PRN supply of norco for pain control, advised this should not be long term   Payton Coward, MD [facility]  [facility]

## 2024-06-23 NOTE — Telephone Encounter (Signed)
 Received call from patient regarding hydrocodone -acetaminophen  5-325 mg. He states that pharmacist told him that provider's DEA number could not be used.   Called pharmacy. Pharmacist reports that they attempted to run prescription, however, it is being rejected with [name] [name]'s DEA number.   I verified that they were using the entire DEA number and that there were no overrides that they could use to process this prescription.   Pharmacist said that they would need another provider to resend prescription.   Cancelled existing prescription.   Forwarding to afternoon preceptor.   Chiquita JAYSON English, RN

## 2024-06-23 NOTE — Telephone Encounter (Signed)
 Repeat hydrocodone  prescription sent

## 2024-06-23 NOTE — Assessment & Plan Note (Addendum)
 Uncontrolled, asymptomatic, took BP meds today Unclear exactly what dose he is taking of hydralazine  Scheduled for f/u appt on 8/6, advised to bring all of his medications with him for review Also advised to bring it up with his nephrologist at dialysis tomorrow/Tuesday

## 2024-06-23 NOTE — Patient Instructions (Addendum)
 Please bring all of your medications to your next visit on August 6th  Please ask your kidney doctor about your high blood pressure when you go for dialysis  You will need another MRI in about 6 months

## 2024-06-24 DIAGNOSIS — N2581 Secondary hyperparathyroidism of renal origin: Secondary | ICD-10-CM | POA: Diagnosis not present

## 2024-06-24 DIAGNOSIS — D689 Coagulation defect, unspecified: Secondary | ICD-10-CM | POA: Diagnosis not present

## 2024-06-24 DIAGNOSIS — N186 End stage renal disease: Secondary | ICD-10-CM | POA: Diagnosis not present

## 2024-06-24 DIAGNOSIS — D631 Anemia in chronic kidney disease: Secondary | ICD-10-CM | POA: Diagnosis not present

## 2024-06-24 DIAGNOSIS — Z992 Dependence on renal dialysis: Secondary | ICD-10-CM | POA: Diagnosis not present

## 2024-06-24 DIAGNOSIS — I132 Hypertensive heart and chronic kidney disease with heart failure and with stage 5 chronic kidney disease, or end stage renal disease: Secondary | ICD-10-CM | POA: Diagnosis not present

## 2024-06-24 DIAGNOSIS — T8249XA Other complication of vascular dialysis catheter, initial encounter: Secondary | ICD-10-CM | POA: Diagnosis not present

## 2024-06-26 ENCOUNTER — Telehealth: Payer: Self-pay

## 2024-06-26 ENCOUNTER — Ambulatory Visit: Payer: Self-pay | Admitting: Family Medicine

## 2024-06-26 NOTE — Telephone Encounter (Signed)
 Patient called to report incision in mid-left arm is open and draining clear fluid, not bleeding. Advised patient to keep incision clean and dry, wrap with guaze and ace wrap, changing guaze when it become wet.  Patient will keep his 07/04/24 appt and will have conversations about next steps at that time.

## 2024-06-27 ENCOUNTER — Ambulatory Visit: Admitting: Vascular Surgery

## 2024-06-27 DIAGNOSIS — T8249XA Other complication of vascular dialysis catheter, initial encounter: Secondary | ICD-10-CM | POA: Diagnosis not present

## 2024-06-28 ENCOUNTER — Ambulatory Visit: Payer: Self-pay

## 2024-06-28 VITALS — BP 176/96 | HR 80 | Ht 77.0 in | Wt 244.0 lb

## 2024-06-28 DIAGNOSIS — I10 Essential (primary) hypertension: Secondary | ICD-10-CM

## 2024-06-28 DIAGNOSIS — F332 Major depressive disorder, recurrent severe without psychotic features: Secondary | ICD-10-CM | POA: Diagnosis not present

## 2024-06-28 DIAGNOSIS — Z992 Dependence on renal dialysis: Secondary | ICD-10-CM | POA: Diagnosis not present

## 2024-06-28 DIAGNOSIS — K8050 Calculus of bile duct without cholangitis or cholecystitis without obstruction: Secondary | ICD-10-CM

## 2024-06-28 DIAGNOSIS — N186 End stage renal disease: Secondary | ICD-10-CM | POA: Diagnosis not present

## 2024-06-28 MED ORDER — FLUOXETINE HCL 40 MG PO CAPS: 40.0000 mg | ORAL_CAPSULE | Freq: Every day | ORAL | 3 refills | Status: DC

## 2024-06-28 MED ORDER — HYDRALAZINE HCL 100 MG PO TABS
100.0000 mg | ORAL_TABLET | Freq: Two times a day (BID) | ORAL | 1 refills | Status: DC
Start: 2024-06-28 — End: 2024-07-14

## 2024-06-28 MED ORDER — CARVEDILOL 25 MG PO TABS: 25.0000 mg | ORAL_TABLET | Freq: Two times a day (BID) | ORAL | 3 refills | Status: DC

## 2024-06-28 NOTE — Assessment & Plan Note (Signed)
 Well-controlled on hemodialysis Tuesday, Thursday, Saturday reports removing 5.3 L every visit, does not follow-up with a nephrologist given that they see him at dialysis.  Fluid retention improved from previous months as per patient. -Continue hemodialysis following with no further

## 2024-06-28 NOTE — Progress Notes (Signed)
 SUBJECTIVE:   CHIEF COMPLAINT / HPI:   Hypertension: Repeatedly severe range blood pressures at clinic visits both at [facility] and other clinics, reports some blood pressures at dialysis are over 200/110.  Endorses some intermittent headaches, but otherwise no chest pain, shortness of breath, changes in vision, or focal neurological deficits.   Has not done any home blood pressure readings, but does have a blood pressure cuff.  Interdialysis readings include  156/80 06/11/2024, 173/90 06/14/2024, 188/91 06/07/2024  ESRD: Dialyzes 5.3 L Tuesday, Thursday, Saturday.  Reports that his only follow-up with nephrologist since he started dialysis.  Reports swelling has been well-controlled recently, and often would spread to his left hand and puff my leg up like a balloon.  Does report he makes urine and urinates once a day, not a lot of urine when he does void.  Depression: PHQ-9 today was 24, no SI or HI.  Endorses this has been an ongoing issue for several months, and related to intense feelings of loneliness.  Does not have family support, social support around his neighborhood, or any religious affiliations.  Has previously benefited from therapy.  Endorses anhedonia, depressed mood, trouble sleeping, trouble concentrating, psychomotor symptoms, changes in appetite, and guilt for symptoms.  Currently takes fluoxetine  20 mg once a day  Cholelithiasis: MRI and MRCP done 06/21/2024 that found cholelithiasis, no comment on size of stones, with no acute findings of cholecystitis.  Patient reports over the last 6 months has had 3 episodes of severe 10 out of 10 sharp stabbing right upper quadrant pain with no association to meals.  Was concerned because of imaging findings and symptoms, no episodes of fever, nausea, vomiting, or diarrhea.  PERTINENT  PMH / PSH: Type 2 diabetes, tobacco use, right BKA  OBJECTIVE:   BP (!) 167/91   Pulse 80   Ht 6' 5 (1.956 m)   Wt 244 lb (110.7 kg)   SpO2 97%   BMI 28.93  kg/m    Cardiac: Regular rate and rhythm. Normal S1/S2. No murmurs, rubs, or gallops appreciated. Lungs: Clear bilaterally to ascultation.  Abdomen: Normoactive bowel sounds.  Right upper quadrant tenderness, positive Murphy sign, negative Rovsing sign, negative rebound tenderness, soft abdomen, nondistended, with no guarding. MSK: Right leg BKA, left leg with chronic hyperkeratotic scaling skin lesions diffusely over all of left leg from chronic fluid retention.  Left leg 3+ pitting edema to proximal tibia.  AV fistula not erythematous and nontender. Psych: Flat affect, depressed mood, mildly reactive over conversation, no paranoia or psychosis   ASSESSMENT/PLAN:   Assessment & Plan ESRD (end stage renal disease) on dialysis [facility]) Well-controlled on hemodialysis Tuesday, Thursday, Saturday reports removing 5.3 L every visit, does not follow-up with a nephrologist given that they see him at dialysis.  Fluid retention improved from previous months as per patient. -Continue hemodialysis following with no further Calculus of bile duct without cholecystitis and without obstruction Low concern for cholecystitis, but does endorse episodes of sharp stabbing pain that only last 3 to 5 minutes before spontaneous resolution.  Quite tender RUQ on exam with positive Murphy sign and no rebound or guarding.  Low concern for acute abdomen but patient may be a good candidate for cholecystectomy -She general surgery referral placed Severe hypertension Uncontrolled, blood pressure today 176/96, other interdialysis blood pressures reading similarly over the last several months.  Has tried amlodipine  previously but was unsure why it was discontinued.  Patient brought metoprolol  succinate ER 50 mg which was disposed of  by CMA [place] -Continue diazepam -Increase hydralazine  from 75 to 100 mg twice daily -Stop metoprolol  succinate ER 25 mg -Start carvedilol  25 mg twice daily -Continue losartan  25 mg  daily -Encouraged to record blood pressure log at least once a day in the morning -Follow-up in 2 weeks for blood pressure and depression Severe episode of recurrent major depressive disorder, without psychotic features (HCC) Uncontrolled, Long history of depression and treated with fluoxetine  20 mg for many years.  Previously benefited from therapy, with intense loneliness and little social network of support. -V [facility] referral placed -Handout given for therapy options     Fairy Amy, MD [facility]

## 2024-06-28 NOTE — Assessment & Plan Note (Signed)
 Uncontrolled, blood pressure today 176/96, other interdialysis blood pressures reading similarly over the last several months.  Has tried amlodipine  previously but was unsure why it was discontinued.  Patient brought metoprolol  succinate ER 50 mg which was disposed of by C[facility] [place] -Continue diazepam -Increase hydralazine  from 75 to 100 mg twice daily -Stop metoprolol  succinate ER 25 mg -Start carvedilol  25 mg twice daily -Continue losartan  25 mg daily -Encouraged to record blood pressure log at least once a day in the morning -Follow-up in 2 weeks for blood pressure and depression

## 2024-06-28 NOTE — Assessment & Plan Note (Signed)
 Uncontrolled, Long history of depression and treated with fluoxetine  20 mg for many years.  Previously benefited from therapy, with intense loneliness and little social network of support. -V [facility] referral placed -Handout given for therapy options

## 2024-06-28 NOTE — Patient Instructions (Addendum)
 Thank you for visiting the clinic today, it was good to see you!  Please always bring your medication bottles  In today's visit we discussed:  Hypertension: We are starting carvedilol  instead of the metoprolol  which you were taking.  Take the carvedilol  25 mg, 1 tablet, twice a day We are also increasing the dose of hydralazine  from 75 to 100 mg.  Take one 100 mg tablet twice a day. Monitor your blood pressure at home in the morning, return with the blood pressure log at your follow-up in 2 weeks  Depression: I have increased your Rosac to 40 mg, you can dispose of the 20 mg tablets.  I have also sent in a referral to connect you with resources for therapy.   For information on therapists, please go to [email or web address]. You can also contact your insurance company to find an in-network therapist.   Please follow-up in 2 weeks  For any questions, please call the office at [phone] or send me a message in MyChart. Have a great day!  -Fairy Amy, MD  [facility] Family Medicine Resident, PGY-1

## 2024-06-29 ENCOUNTER — Telehealth: Payer: Self-pay | Admitting: *Deleted

## 2024-06-29 ENCOUNTER — Encounter (HOSPITAL_BASED_OUTPATIENT_CLINIC_OR_DEPARTMENT_OTHER): Attending: Internal Medicine | Admitting: Internal Medicine

## 2024-06-29 ENCOUNTER — Encounter

## 2024-06-29 DIAGNOSIS — T8131XA Disruption of external operation (surgical) wound, not elsewhere classified, initial encounter: Secondary | ICD-10-CM | POA: Diagnosis not present

## 2024-06-29 DIAGNOSIS — S81002D Unspecified open wound, left knee, subsequent encounter: Secondary | ICD-10-CM | POA: Diagnosis not present

## 2024-06-29 DIAGNOSIS — I1311 Hypertensive heart and chronic kidney disease without heart failure, with stage 5 chronic kidney disease, or end stage renal disease: Secondary | ICD-10-CM | POA: Diagnosis not present

## 2024-06-29 DIAGNOSIS — Z992 Dependence on renal dialysis: Secondary | ICD-10-CM | POA: Insufficient documentation

## 2024-06-29 DIAGNOSIS — Z89432 Acquired absence of left foot: Secondary | ICD-10-CM | POA: Diagnosis not present

## 2024-06-29 DIAGNOSIS — N186 End stage renal disease: Secondary | ICD-10-CM | POA: Diagnosis not present

## 2024-06-29 DIAGNOSIS — T8249XA Other complication of vascular dialysis catheter, initial encounter: Secondary | ICD-10-CM | POA: Diagnosis not present

## 2024-06-29 DIAGNOSIS — L0231 Cutaneous abscess of buttock: Secondary | ICD-10-CM | POA: Insufficient documentation

## 2024-06-29 DIAGNOSIS — D631 Anemia in chronic kidney disease: Secondary | ICD-10-CM | POA: Diagnosis not present

## 2024-06-29 DIAGNOSIS — T8131XD Disruption of external operation (surgical) wound, not elsewhere classified, subsequent encounter: Secondary | ICD-10-CM | POA: Diagnosis not present

## 2024-06-29 DIAGNOSIS — D689 Coagulation defect, unspecified: Secondary | ICD-10-CM | POA: Diagnosis not present

## 2024-06-29 DIAGNOSIS — N2581 Secondary hyperparathyroidism of renal origin: Secondary | ICD-10-CM | POA: Diagnosis not present

## 2024-06-29 DIAGNOSIS — I132 Hypertensive heart and chronic kidney disease with heart failure and with stage 5 chronic kidney disease, or end stage renal disease: Secondary | ICD-10-CM | POA: Diagnosis not present

## 2024-06-29 NOTE — Progress Notes (Signed)
 Complex Care Management Note Care Guide Note  06/29/2024 Name: [name] [name] MRN: [id number] DOB: 15-Feb-1971   Complex Care Management Outreach Attempts: An unsuccessful telephone outreach was attempted today to offer the patient information about available complex care management services.  Follow Up Plan:  Additional outreach attempts will be made to offer the patient complex care management information and services.   Encounter Outcome:  No Answer  Thedford Franks, CMA [place]  [facility], [facility] Guide Direct Dial : [phone]  Fax: [phone] Website: [email or web address]

## 2024-06-30 ENCOUNTER — Other Ambulatory Visit: Payer: Self-pay | Admitting: Physician Assistant

## 2024-06-30 ENCOUNTER — Other Ambulatory Visit: Payer: Self-pay

## 2024-06-30 ENCOUNTER — Other Ambulatory Visit: Payer: Self-pay | Admitting: Family Medicine

## 2024-06-30 NOTE — Progress Notes (Signed)
 Complex Care Management Note Care Guide Note  06/30/2024 Name: [name] [name] MRN: [id number] DOB: January 16, 1971   Complex Care Management Outreach Attempts: A second unsuccessful outreach was attempted today to offer the patient with information about available complex care management services.  Follow Up Plan:  Additional outreach attempts will be made to offer the patient complex care management information and services.   Encounter Outcome:  No Answer  Thedford Franks, CMA, [place]  [facility], [facility] Guide Direct Dial : [phone]  Fax: [phone] Website: [email or web address]

## 2024-07-01 DIAGNOSIS — I132 Hypertensive heart and chronic kidney disease with heart failure and with stage 5 chronic kidney disease, or end stage renal disease: Secondary | ICD-10-CM | POA: Diagnosis not present

## 2024-07-01 MED ORDER — DILTIAZEM HCL ER COATED BEADS 300 MG PO CP24: 300.0000 mg | ORAL_CAPSULE | Freq: Every day | ORAL | 0 refills | Status: DC

## 2024-07-04 ENCOUNTER — Ambulatory Visit: Attending: Family Medicine

## 2024-07-04 DIAGNOSIS — N186 End stage renal disease: Secondary | ICD-10-CM | POA: Diagnosis not present

## 2024-07-04 DIAGNOSIS — N2581 Secondary hyperparathyroidism of renal origin: Secondary | ICD-10-CM | POA: Diagnosis not present

## 2024-07-04 DIAGNOSIS — D631 Anemia in chronic kidney disease: Secondary | ICD-10-CM | POA: Diagnosis not present

## 2024-07-04 DIAGNOSIS — T8249XA Other complication of vascular dialysis catheter, initial encounter: Secondary | ICD-10-CM | POA: Diagnosis not present

## 2024-07-04 DIAGNOSIS — Z992 Dependence on renal dialysis: Secondary | ICD-10-CM | POA: Diagnosis not present

## 2024-07-04 DIAGNOSIS — I132 Hypertensive heart and chronic kidney disease with heart failure and with stage 5 chronic kidney disease, or end stage renal disease: Secondary | ICD-10-CM | POA: Diagnosis not present

## 2024-07-04 NOTE — Progress Notes (Signed)
 Complex Care Management Note Care Guide Note  07/04/2024 Name: [name] [name] MRN: [id number] DOB: 09/04/1971   Complex Care Management Outreach Attempts: A third unsuccessful outreach was attempted today to offer the patient with information about available complex care management services.  Follow Up Plan:  No further outreach attempts will be made at this time. We have been unable to contact the patient to offer or enroll patient in complex care management services.  Encounter Outcome:  No Answer  Thedford Franks, CMA, Care Guide [facility]  [facility], [facility] Guide Direct Dial : [phone]  Fax: [phone] Website: [email or web address]

## 2024-07-05 NOTE — Progress Notes (Signed)
 Complex Care Management Note  Care Guide Note 07/05/2024 Name: [name] [name] MRN: [id number] DOB: Jul 03, 1971  [name] [name] is a 53 y.o. year old male who sees Hindel, Leah, MD for primary care. I reached out to [name] [name] by phone today to offer complex care management services.  [name] was given information about Complex Care Management services today including:   The Complex Care Management services include support from the care team which includes your Nurse Care Manager, Clinical Social Worker, or Pharmacist.  The Complex Care Management team is here to help remove barriers to the health concerns and goals most important to you. Complex Care Management services are voluntary, and the patient may decline or stop services at any time by request to their care team member.   Complex Care Management Consent Status: Patient agreed to services and verbal consent obtained.   Follow up plan:  Telephone appointment with complex care management team member scheduled for:  07/17/2024  Encounter Outcome:  Patient Scheduled  Thedford Franks, CMA [place]  [facility], [facility] Guide Direct Dial : [phone]  Fax: [phone] Website: [email or web address]

## 2024-07-06 ENCOUNTER — Ambulatory Visit: Attending: Vascular Surgery | Admitting: Physician Assistant

## 2024-07-06 ENCOUNTER — Other Ambulatory Visit: Payer: Self-pay

## 2024-07-06 ENCOUNTER — Encounter (HOSPITAL_BASED_OUTPATIENT_CLINIC_OR_DEPARTMENT_OTHER): Admitting: Internal Medicine

## 2024-07-06 VITALS — BP 171/76 | HR 71 | Temp 98.4°F | Resp 18 | Ht 77.0 in | Wt 235.9 lb

## 2024-07-06 DIAGNOSIS — T8131XD Disruption of external operation (surgical) wound, not elsewhere classified, subsequent encounter: Secondary | ICD-10-CM

## 2024-07-06 DIAGNOSIS — N2581 Secondary hyperparathyroidism of renal origin: Secondary | ICD-10-CM | POA: Diagnosis not present

## 2024-07-06 DIAGNOSIS — I132 Hypertensive heart and chronic kidney disease with heart failure and with stage 5 chronic kidney disease, or end stage renal disease: Secondary | ICD-10-CM | POA: Diagnosis not present

## 2024-07-06 DIAGNOSIS — T8249XA Other complication of vascular dialysis catheter, initial encounter: Secondary | ICD-10-CM | POA: Diagnosis not present

## 2024-07-06 DIAGNOSIS — Z992 Dependence on renal dialysis: Secondary | ICD-10-CM | POA: Diagnosis not present

## 2024-07-06 DIAGNOSIS — S31819D Unspecified open wound of right buttock, subsequent encounter: Secondary | ICD-10-CM

## 2024-07-06 DIAGNOSIS — N186 End stage renal disease: Secondary | ICD-10-CM | POA: Diagnosis not present

## 2024-07-06 DIAGNOSIS — D631 Anemia in chronic kidney disease: Secondary | ICD-10-CM | POA: Diagnosis not present

## 2024-07-06 DIAGNOSIS — D689 Coagulation defect, unspecified: Secondary | ICD-10-CM | POA: Diagnosis not present

## 2024-07-06 NOTE — Progress Notes (Signed)
 POST OPERATIVE OFFICE NOTE    CC:  F/u for surgery  HPI:  [name] [name] is a 53 y.o. male who is here for repeat wound check.  He has a history of left second stage basilic vein transposition.  He developed a seroma postoperatively and was brought back to the operating room on 05/17/2024 for irrigation and debridement with incisional VAC placement.  This was removed after a week however he continued to drain serous fluid from the incision.  He then had a peel and place VAC placed on his incision in office on 05/24/2024.  He returned to our clinic on a weekly basis for VAC changes until 06/20/2024.  He has also been placed on a 2-week course of doxycycline .  Unfortunately his VAC changes made minimal improvement with his incision healing and he was switched to dry dressing changes.  He was also encouraged to cut back or stop smoking in efforts to heal his incision, however he was unwilling.  He returns today for repeat wound check.  He says he has changed his left arm dry dressing 2 times daily since his last office visit.  He continues to have serous drainage from this area.  He denies any fevers or chills.  He denies any puslike drainage.     Allergies  Allergen Reactions   Iron  Nausea And Vomiting    With the iron  tablets   Tape Rash    Rash at site of tape--okay to use paper tape    Current Outpatient Medications  Medication Sig Dispense Refill   Accu-Chek Softclix Lancets lancets Use to check blood sugar twice per day. E11.9 100 each 12   albuterol  (VENTOLIN  HFA) 108 (90 Base) MCG/ACT inhaler Inhale 2 puffs into the lungs every 6 (six) hours as needed for wheezing or shortness of breath. 8 g 2   atorvastatin  (LIPITOR) 40 MG tablet TAKE 1 TABLET(40 MG) BY MOUTH AT BEDTIME 90 tablet 0   Blood Glucose Monitoring Suppl (ACCU-CHEK GUIDE ME) w/Device KIT See admin instructions.     calcitRIOL  (ROCALTROL ) 0.25 MCG capsule Take 1 capsule (0.25 mcg total) by mouth Every Tuesday,Thursday,and  Saturday with dialysis. 30 capsule 0   Calcium  Acetate 667 MG TABS Take 1,334 mg by mouth 3 (three) times daily. (Patient not taking: Reported on 06/28/2024)     carvedilol  (COREG ) 25 MG tablet Take 1 tablet (25 mg total) by mouth 2 (two) times daily with a meal. 180 tablet 3   diltiazem  (CARDIZEM  CD) 300 MG 24 hr capsule Take 1 capsule (300 mg total) by mouth daily. 90 capsule 0   FLUoxetine  (PROZAC ) 40 MG capsule Take 1 capsule (40 mg total) by mouth daily. 90 capsule 3   fluticasone  (FLONASE ) 50 MCG/ACT nasal spray Place 2 sprays into both nostrils daily. (Patient not taking: Reported on 06/28/2024) 16 g 6   Fluticasone -Umeclidin-Vilant (TRELEGY ELLIPTA ) 100-62.5-25 MCG/ACT AEPB INHALE 1 PUFF INTO LUNGS DAILY 60 each 3   gabapentin  (NEURONTIN ) 300 MG capsule TAKE 1 CAPSULE(300 MG) BY MOUTH DAILY 30 capsule 0   glucose blood (AGAMATRIX PRESTO TEST) test strip Check blood sugars twice daily before meals 100 each 12   glucose blood test strip 1 each by Other route as needed for other. Use as instructed     hydrALAZINE  (APRESOLINE ) 100 MG tablet Take 1 tablet (100 mg total) by mouth 2 (two) times daily. 60 tablet 1   insulin  lispro (HUMALOG ) 100 UNIT/ML injection 0-6 Units, Subcutaneous, 3 times daily with meals, First dose on  Sun 10/17/23 at 0800 Correction coverage: Very Sensitive (ESRD/Dialysis) CBG < 70: Implement Hypoglycemia Standing Orders and refer to Hypoglycemia Standing Orders sidebar report CBG 70 - 120: 0 units CBG 121 - 150: 0 units CBG 151 - 200: 1 unit CBG 201-250: 2 units CBG 251-300: 3 units CBG 301-350: 4 units CBG 351-400: 5 units CBG > 400: Give 6 units and call MD     loratadine  (CLARITIN ) 10 MG tablet Take 1 tablet (10 mg total) by mouth daily. (Patient not taking: Reported on 06/28/2024) 30 tablet 11   losartan  (COZAAR ) 25 MG tablet TAKE 1 TABLET(25 MG) BY MOUTH DAILY 90 tablet 0   methocarbamol  (ROBAXIN ) 750 MG tablet Take 1 tablet (750 mg total) by mouth every 8 (eight)  hours as needed for muscle spasms. 30 tablet 0   nicotine  (NICODERM CQ  - DOSED IN MG/24 HOURS) 14 mg/24hr patch Place 1 patch (14 mg total) onto the skin daily. 28 patch 0   ondansetron  (ZOFRAN -ODT) 4 MG disintegrating tablet Take 1 tablet (4 mg total) by mouth every 8 (eight) hours as needed for nausea or vomiting. 20 tablet 0   pantoprazole  (PROTONIX ) 40 MG tablet TAKE 1 TABLET(40 MG) BY MOUTH DAILY 90 tablet 0   No current facility-administered medications for this visit.     ROS:  See HPI  Physical Exam:   Incision: All sutures removed from left arm incision.  Lateral edges of left arm incision have healed.  Superficial dehiscence of central portion of left arm incision without signs of infection or drainage. Extremities: Palpable left radial pulse.  Palpable thrill in left brachiobasilic fistula Neuro: Intact motor and sensation of left hand      Assessment/Plan:  This is a 53 y.o. male who is here for repeat wound check  -The patient has a history of seroma formation after left second stage basilic vein transposition.  He has undergone irrigation and debridement of this area plus peel an place vac changes. -The patient was recently switched back to daily dry dressing changes to keep the arm dry in hopes of healing his incision -Unfortunately the patient's incision still has not made significant progress.  I have removed the sutures from his incision today.  The lateral edges of his incision has healed, however there is superficial central dehiscence without signs of infection -The patient has been unable to heal his left arm after several weeks.  I have explained to the patient that he may ultimately require a return trip to the OR for revision.  He does not want to do that at this time.  I have also encouraged the patient again to quit or cut back on smoking to help with his healing.  He is unwilling to do this. -Given that he has a small area of wound dehiscence of his incision,  without signs of infection, we can attempt wet-to-dry dressing changes of his incision to encourage healing.  He can perform this daily until his next wound check. -He can follow-up with our office in 2 weeks for repeat wound check.  If his incision is not healing anymore or getting worse by his next visit, he should likely return to the OR for repeat wound irrigation, debridement, and closure   Ahmed Holster, PA-C [facility] [phone]   Clinic MD:  [place]

## 2024-07-07 ENCOUNTER — Ambulatory Visit (INDEPENDENT_AMBULATORY_CARE_PROVIDER_SITE_OTHER): Admitting: Family Medicine

## 2024-07-07 ENCOUNTER — Encounter (HOSPITAL_BASED_OUTPATIENT_CLINIC_OR_DEPARTMENT_OTHER): Admitting: Internal Medicine

## 2024-07-07 ENCOUNTER — Encounter: Payer: Self-pay | Admitting: Family Medicine

## 2024-07-07 VITALS — BP 169/74 | HR 69

## 2024-07-07 DIAGNOSIS — Z992 Dependence on renal dialysis: Secondary | ICD-10-CM

## 2024-07-07 DIAGNOSIS — I1311 Hypertensive heart and chronic kidney disease without heart failure, with stage 5 chronic kidney disease, or end stage renal disease: Secondary | ICD-10-CM | POA: Diagnosis not present

## 2024-07-07 DIAGNOSIS — L0231 Cutaneous abscess of buttock: Secondary | ICD-10-CM

## 2024-07-07 DIAGNOSIS — K629 Disease of anus and rectum, unspecified: Secondary | ICD-10-CM

## 2024-07-07 DIAGNOSIS — I10 Essential (primary) hypertension: Secondary | ICD-10-CM

## 2024-07-07 DIAGNOSIS — T8131XA Disruption of external operation (surgical) wound, not elsewhere classified, initial encounter: Secondary | ICD-10-CM | POA: Diagnosis not present

## 2024-07-07 DIAGNOSIS — T8131XD Disruption of external operation (surgical) wound, not elsewhere classified, subsequent encounter: Secondary | ICD-10-CM

## 2024-07-07 DIAGNOSIS — Z89511 Acquired absence of right leg below knee: Secondary | ICD-10-CM

## 2024-07-07 DIAGNOSIS — N186 End stage renal disease: Secondary | ICD-10-CM | POA: Diagnosis not present

## 2024-07-07 DIAGNOSIS — Z89432 Acquired absence of left foot: Secondary | ICD-10-CM | POA: Diagnosis not present

## 2024-07-07 MED ORDER — HYDROCODONE-ACETAMINOPHEN 5-325 MG PO TABS: 1.0000 | ORAL_TABLET | Freq: Four times a day (QID) | ORAL | 0 refills | Status: AC | PRN

## 2024-07-07 MED ORDER — CARVEDILOL 12.5 MG PO TABS: 12.5000 mg | ORAL_TABLET | Freq: Two times a day (BID) | ORAL | 0 refills | Status: DC

## 2024-07-07 NOTE — Assessment & Plan Note (Signed)
 Patient reports chronic pain post amputation as well as acute pain from perianal lesion for which she was hospitalized in the summer underwent I&D.  Has been taking Norco and gabapentin  for this, states these do not help much.  Given other concerns today, will continue current regimen and discuss making changes at future appointments.  PDMP appropriate today -- Provided refill of Norco 5-325, 20 tablets

## 2024-07-07 NOTE — Assessment & Plan Note (Addendum)
 Patient with left brachial basilic fistula and a history of seroma.  Saw [facility] yesterday, patient reports that he was to do wet to dry dressings daily.  Concerns around drainage today.  Does not appear infected on my inspection and patient without systemic symptoms currently. Messaged [facility] provider who saw patient yesterday to ask for thoughts on fistula appearance.  Scheduled patient for close follow-up with [facility] provider next week to check fistula site.  Also advised patient to ask wound care providers to take a look at his arm next time he is there.

## 2024-07-07 NOTE — Patient Instructions (Signed)
 Thank you for coming in today! Here is a summary of what we discussed:  -Please start taking Carvedilol  12.5mg  twice daily. You should take the first dose after dialysis  -Please ask the wound care providers to take a look at your arm next time you are there. Please let us  know if you develop fevers, chills, or if there is bloody drainage coming from the wound.  Please call the clinic at [phone] if your symptoms worsen or you have any concerns.  Best, Dr Adele

## 2024-07-07 NOTE — Assessment & Plan Note (Addendum)
 Blood pressure elevated today.  Patient denies red flag symptoms, has chronic shortness of breath that he says is at baseline.  Given decreased heart rate in the morning, will decrease carvedilol  to 12.5 mg twice daily and advised patient to take the first dose after dialysis.  Scheduled for follow-up at [facility] next week, will monitor blood pressure and heart rate at that time.

## 2024-07-07 NOTE — Progress Notes (Addendum)
    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 Difficult to sit on some surfaces. Difficulty with sleep, affects mood. Also has a lot of pain in his R BKA stump. Also takes gabapentin . States neither agent helps much Medication and dose: 5-325mg  Norco, BID # pills per month: 20-30 Last UDS date: 2022, ESRD patient who does not make much urine.  Declined UDS today Opioid Treatment Agreement signed (Y/N): No, will complete a future appointment Opioid Treatment Agreement last reviewed with patient:  not done NCCSRS reviewed this encounter (include red flags):  yes, no red flags   HTN Last appt- diltiazem , hydral (inc to 100mg  BID), losartan  25mg  daily, carvedilol  25mg  BID, d/c metop Took all meds today Noticed HR in the 40s at the start of dialysis. Takes carvedilol  right before HD. Feels sleepy in AM since starting the carvedilol . No headaches Has had a little blurred vision, noticed about a month ago  LUE fistula Removed stitches at [facility] yesterday Noticed ongoing drainage Some pain No fevers No chest pain SOB chronically, not worse  Depression PHQ9 score 22. Was 24 at last visit No to question 9 [facility] referral placed at last appointment and therapy resources given  PERTINENT  PMH / PSH: HTN, ESRD on HD, GERD, pancreatic cyst, right BKA  OBJECTIVE:   BP (!) 169/74   Pulse 69   SpO2 96%   - General: No acute distress. Awake and conversant. - Respiratory: Respirations are non-labored. - Skin: chronic skin changes to LLE, LUE fistula as below.  Some serous drainage and surrounding erythema, no signs of infection, no bloody drainage - Psych: Alert and oriented. Cooperative, Appropriate mood and affect, Normal judgment. - MSK: R BKA with prosthetic limb - Neuro: Sensation and CN II-XII grossly normal.     ASSESSMENT/PLAN:   Assessment &  Plan ESRD (end stage renal disease) on dialysis [facility]) Patient with left brachial basilic fistula and a history of seroma.  Saw [facility] yesterday, patient reports that he was to do wet to dry dressings daily.  Concerns around drainage today.  Does not appear infected on my inspection and patient without systemic symptoms currently. Messaged [facility] provider who saw patient yesterday to ask for thoughts on fistula appearance.  Scheduled patient for close follow-up with [facility] provider next week to check fistula site.  Also advised patient to ask wound care providers to take a look at his arm next time he is there.  Severe hypertension Blood pressure elevated today.  Patient denies red flag symptoms, has chronic shortness of breath that he says is at baseline.  Given decreased heart rate in the morning, will decrease carvedilol  to 12.5 mg twice daily and advised patient to take the first dose after dialysis.  Scheduled for follow-up at [facility] next week, will monitor blood pressure and heart rate at that time. History of right below knee amputation (HCC) Perianal lesion Patient reports chronic pain post amputation as well as acute pain from perianal lesion for which she was hospitalized in the summer underwent I&D.  Has been taking Norco and gabapentin  for this, states these do not help much.  Given other concerns today, will continue current regimen and discuss making changes at future appointments.  PDMP appropriate today -- Provided refill of Norco 5-325, 20 tablets    Rea Raring, MD [facility]

## 2024-07-08 DIAGNOSIS — M799 Soft tissue disorder, unspecified: Secondary | ICD-10-CM | POA: Diagnosis not present

## 2024-07-08 DIAGNOSIS — M25461 Effusion, right knee: Secondary | ICD-10-CM | POA: Diagnosis not present

## 2024-07-08 DIAGNOSIS — L02415 Cutaneous abscess of right lower limb: Secondary | ICD-10-CM | POA: Diagnosis not present

## 2024-07-08 DIAGNOSIS — Z89511 Acquired absence of right leg below knee: Secondary | ICD-10-CM | POA: Diagnosis not present

## 2024-07-08 DIAGNOSIS — I132 Hypertensive heart and chronic kidney disease with heart failure and with stage 5 chronic kidney disease, or end stage renal disease: Secondary | ICD-10-CM | POA: Diagnosis not present

## 2024-07-08 DIAGNOSIS — R600 Localized edema: Secondary | ICD-10-CM | POA: Diagnosis not present

## 2024-07-08 DIAGNOSIS — K8020 Calculus of gallbladder without cholecystitis without obstruction: Secondary | ICD-10-CM | POA: Diagnosis not present

## 2024-07-09 DIAGNOSIS — E118 Type 2 diabetes mellitus with unspecified complications: Secondary | ICD-10-CM | POA: Diagnosis not present

## 2024-07-09 DIAGNOSIS — Z794 Long term (current) use of insulin: Secondary | ICD-10-CM | POA: Diagnosis not present

## 2024-07-11 DIAGNOSIS — Z992 Dependence on renal dialysis: Secondary | ICD-10-CM | POA: Diagnosis not present

## 2024-07-11 DIAGNOSIS — D631 Anemia in chronic kidney disease: Secondary | ICD-10-CM | POA: Diagnosis not present

## 2024-07-11 DIAGNOSIS — D689 Coagulation defect, unspecified: Secondary | ICD-10-CM | POA: Diagnosis not present

## 2024-07-11 DIAGNOSIS — T8249XA Other complication of vascular dialysis catheter, initial encounter: Secondary | ICD-10-CM | POA: Diagnosis not present

## 2024-07-11 DIAGNOSIS — N2581 Secondary hyperparathyroidism of renal origin: Secondary | ICD-10-CM | POA: Diagnosis not present

## 2024-07-11 DIAGNOSIS — I132 Hypertensive heart and chronic kidney disease with heart failure and with stage 5 chronic kidney disease, or end stage renal disease: Secondary | ICD-10-CM | POA: Diagnosis not present

## 2024-07-13 ENCOUNTER — Other Ambulatory Visit: Payer: Self-pay

## 2024-07-13 DIAGNOSIS — T8249XA Other complication of vascular dialysis catheter, initial encounter: Secondary | ICD-10-CM | POA: Diagnosis not present

## 2024-07-13 DIAGNOSIS — Z992 Dependence on renal dialysis: Secondary | ICD-10-CM | POA: Diagnosis not present

## 2024-07-13 DIAGNOSIS — D631 Anemia in chronic kidney disease: Secondary | ICD-10-CM | POA: Diagnosis not present

## 2024-07-13 DIAGNOSIS — I132 Hypertensive heart and chronic kidney disease with heart failure and with stage 5 chronic kidney disease, or end stage renal disease: Secondary | ICD-10-CM | POA: Diagnosis not present

## 2024-07-13 DIAGNOSIS — N2581 Secondary hyperparathyroidism of renal origin: Secondary | ICD-10-CM | POA: Diagnosis not present

## 2024-07-13 DIAGNOSIS — N186 End stage renal disease: Secondary | ICD-10-CM | POA: Diagnosis not present

## 2024-07-13 MED ORDER — ATORVASTATIN CALCIUM 40 MG PO TABS: 40.0000 mg | ORAL_TABLET | Freq: Every day | ORAL | 3 refills | Status: DC

## 2024-07-14 ENCOUNTER — Ambulatory Visit (INDEPENDENT_AMBULATORY_CARE_PROVIDER_SITE_OTHER)

## 2024-07-14 VITALS — BP 167/87 | HR 71 | Wt 244.2 lb

## 2024-07-14 DIAGNOSIS — M62838 Other muscle spasm: Secondary | ICD-10-CM | POA: Diagnosis not present

## 2024-07-14 DIAGNOSIS — S41102D Unspecified open wound of left upper arm, subsequent encounter: Secondary | ICD-10-CM

## 2024-07-14 DIAGNOSIS — I10 Essential (primary) hypertension: Secondary | ICD-10-CM

## 2024-07-14 DIAGNOSIS — K629 Disease of anus and rectum, unspecified: Secondary | ICD-10-CM

## 2024-07-14 MED ORDER — HYDRALAZINE HCL 100 MG PO TABS: 200.0000 mg | ORAL_TABLET | Freq: Two times a day (BID) | ORAL | 1 refills | Status: DC

## 2024-07-14 MED ORDER — METHOCARBAMOL 750 MG PO TABS: 750.0000 mg | ORAL_TABLET | Freq: Three times a day (TID) | ORAL | 0 refills | Status: DC | PRN

## 2024-07-14 NOTE — Assessment & Plan Note (Signed)
 BP 162/72 sitting.  162/76 standing.  Increased hydralazine  to 200 mg twice daily today.  Patient advised to monitor blood pressure at home.  Patient has dialysis tomorrow where his blood pressure will be monitored as well.

## 2024-07-14 NOTE — Patient Instructions (Addendum)
 Today we looked at your arm wound as well as the wound on your buttocks. The wound on your arm is not concerning for being infected today. Please continue to follow-up with vascular for your arm wound. The wound on your buttocks is healing well today. There was very little drainage on the bandage and it is closed.   Today I sent in a refill for your Robaxin . Dr. Adele will send in a refill for your percocet when it is time.   Your blood pressure was 162/72 today. We rechecked it when you were standing up and it was 167/87. So we are going to increase your Hydralazine  to 200 mg twice a day today.

## 2024-07-14 NOTE — Progress Notes (Signed)
    SUBJECTIVE:   CHIEF COMPLAINT / HPI:   Arm and buttocks wound, follow-up on BP  Patient has a slow healing arm wound that is being followed by vascular surgery.  The wound is draining clear fluid.  The patient denies any fevers or warmth over the area.  He saw vascular surgery on 07/06/2024 and has a follow-up appointment on 07/25/2024.  Patient has a wound on his right buttocks/perianal region.  Previously had this wound incised and drained.  He continues to endorse pain that is an 8/10 constant and burning in nature.  He denies any constipation.  PERTINENT  PMH / PSH: AV fistula, type 2 diabetes with insulin  use  OBJECTIVE:   BP (!) 167/87   Pulse 71   Wt 244 lb 3.2 oz (110.8 kg)   SpO2 98%   BMI 28.96 kg/m   General: Chronically ill-appearing male, NAD Cardiovascular: RRR, no M/R/G Respiratory: CTAB anteriorly, normal work of breathing on room air Skin: Well-healing wound on the left medial upper extremity 1 inch proximal to the antecubital space with clear drainage, right buttocks near gluteal cleft, no fluctuation, no erythema, minimal drainage seen on bandage  ASSESSMENT/PLAN:   Assessment & Plan Severe hypertension BP 162/72 sitting.  162/76 standing.  Increased hydralazine  to 200 mg twice daily today.  Patient advised to monitor blood pressure at home.  Patient has dialysis tomorrow where his blood pressure will be monitored as well. Muscle spasm Refilled Robaxin . Wound of left upper extremity, subsequent encounter Healing well, no concern for infection at this time. Continue following with Vascular.  Perianal lesion Scant drainage on bandage. No concern for current infection. Healing well.      Raguel KANDICE Lee, DO [facility]

## 2024-07-15 DIAGNOSIS — N2581 Secondary hyperparathyroidism of renal origin: Secondary | ICD-10-CM | POA: Diagnosis not present

## 2024-07-15 DIAGNOSIS — N186 End stage renal disease: Secondary | ICD-10-CM | POA: Diagnosis not present

## 2024-07-15 DIAGNOSIS — T8249XA Other complication of vascular dialysis catheter, initial encounter: Secondary | ICD-10-CM | POA: Diagnosis not present

## 2024-07-15 DIAGNOSIS — Z992 Dependence on renal dialysis: Secondary | ICD-10-CM | POA: Diagnosis not present

## 2024-07-15 DIAGNOSIS — D631 Anemia in chronic kidney disease: Secondary | ICD-10-CM | POA: Diagnosis not present

## 2024-07-15 DIAGNOSIS — I132 Hypertensive heart and chronic kidney disease with heart failure and with stage 5 chronic kidney disease, or end stage renal disease: Secondary | ICD-10-CM | POA: Diagnosis not present

## 2024-07-17 ENCOUNTER — Other Ambulatory Visit: Payer: Self-pay

## 2024-07-17 ENCOUNTER — Other Ambulatory Visit: Payer: Self-pay | Admitting: Licensed Clinical Social Worker

## 2024-07-17 NOTE — Patient Instructions (Signed)
 Visit Information  Thank you for taking time to visit with me today. Please don't hesitate to contact me if I can be of assistance to you before our next scheduled appointment.  Our next appointment is by telephone on 08/29/2024 at 10:00 AM   Please call the care guide team at [phone] if you need to cancel or reschedule your appointment.   Following is a copy of your care plan:   Goals Addressed             This Visit's Progress    VBCI Social Work Care Plan       Problems:   Depression and Anxiety issues              ESRD: Dialysis 3 times weekly              Pain issues                Skin care issues               Needs to use coping skills to manage stress  CSW Clinical Goal(s):   Over the next 30  days the Patient will attend all scheduled medical appointments as evidenced by patient report and care team review of appointment completion in electronic MEDICAL RECORD NUMBER .             Over next 30 days client will use coping skills learned to manage stress symptoms faced (he likes to watch racing on TV, likes to listen to music) AEB patient report of reduction in stress symptoms faced  Interventions:  Pain issues discussed             Medications procurement discussed             Spoke about support from PCP, Dr. Adele Sermon of transport needs             Spoke of program support with RN, Manufacturing engineer with client about stress in home environment               Discussed mood issues. He mentioned stress in relationship with his sister. He lives with his sister and with his mother              Discussed vision of client. He said he has blurry vision occasionally. Discussed dialysis schedule and treatment              Provided counseling support              Discussed ambulation. He said he uses a rollator walker               Encouraged client to call LCSW as needed for support at [phone].   Patient Goals/Self-Care  Activities:  Attend PCP appointments as scheduled             Client will allow extra time to complete ADLs              Attend scheduled dialysis appointments weekly             Participate in stress reduction activities or hobbies           Call LCSW as needed for SW support                Plan:  Telephone follow up appointment with care management team member scheduled for:  08/29/2024 at 10:00 AM        Please go to [facility] [address] [place] [phone]) if you are experiencing a Mental Health or Behavioral Health Crisis or need someone to talk to.  The patient verbalized understanding of instructions, educational materials, and care plan provided today and DECLINED offer to receive copy of patient instructions, educational materials, and care plan.    Glendia Pear  MSW, LCSW [place] Licensed Clinical Social Worker Direct Dial :  [phone] Fax:  [phone] Website:  [email or web address]

## 2024-07-17 NOTE — Patient Outreach (Signed)
 Complex Care Management   Visit Note  07/17/2024  Name:  [name] [name] MRN: [id number] DOB: Feb 13, 1971  Situation: Referral received for Complex Care Management related to SDOH needs; depression and anxiety needs I obtained verbal consent from Patient.  Visit completed with Patient  on the phone  Background:   Past Medical History:  Diagnosis Date   Abdominal pain 11/30/2023   Abdominal tenderness 10/14/2023   Allergy    seasonal and environmental   Anemia    hx blood transfusion 04/27/24   Chest pain 12/01/2023   Chronic constipation 05/13/2020   Depression    Diabetes mellitus without complication (HCC) 2019   Dyspnea    hx - SOB with exertion   ESRD on hemodialysis (HCC)    Tues, Thurs, Sat   GERD (gastroesophageal reflux disease)    Hyperlipidemia    Hypertension    Hypertensive urgency 01/24/2024   Hypotension 12/01/2023   Necrotizing fasciitis (HCC) 10/13/2017   Neuromuscular disorder (HCC)    neuropathy   PAD (peripheral artery disease) (HCC)    Patellar tendinitis 10/12/2023   Secondary hyperparathyroidism (HCC)    SVT (supraventricular tachycardia) (HCC)    hx   Wound dehiscence 11/24/2017    Assessment: Patient Reported Symptoms:  Cognitive Cognitive Status: Alert and oriented to person, place, and time, Difficulties with attention and concentration Cognitive/Intellectual Conditions Management [RPT]: None reported or documented in medical history or problem list   Health Maintenance Behaviors: Annual physical exam, Stress management Health Facilitated by: Stress management  Neurological Neurological Review of Symptoms: Weakness, Dizziness Neurological Management Strategies: Coping strategies, Adequate rest  HEENT HEENT Symptoms Reported: Sudden change or loss of vision (blurry vision occasionally) HEENT Management Strategies: Coping strategies    Cardiovascular Cardiovascular Symptoms Reported: Dizziness, Fatigue Cardiovascular Management  Strategies: Coping strategies  Respiratory Respiratory Symptoms Reported: No symptoms reported Respiratory Management Strategies: Coping strategies  Endocrine Endocrine Symptoms Reported: Weakness or fatigue, Shortness of breath, Unintentional weight gain Is patient diabetic?: Yes    Gastrointestinal Gastrointestinal Symptoms Reported: Unintentional weight gain, Abdominal pain or discomfort Gastrointestinal Management Strategies: Adequate rest, Coping strategies    Genitourinary Genitourinary Symptoms Reported: Frequency (client said he goes to bathroom about once per day.) Genitourinary Management Strategies: Coping strategies  Integumentary Integumentary Symptoms Reported: Skin changes Additional Integumentary Details: Left Arm skin care Skin Management Strategies: Coping strategies  Musculoskeletal Musculoskelatal Symptoms Reviewed: Muscle pain, Unsteady gait, Weakness Musculoskeletal Management Strategies: Coping strategies   Patient at Risk for Falls Due to: Impaired balance/gait, Impaired vision, History of fall(s)  Psychosocial Psychosocial Symptoms Reported: Anxiety - if selected complete GAD, Sadness - if selected complete PHQ 2-9, Depression - if selected complete PHQ 2-9 Behavioral Management Strategies: Coping strategies Major Change/Loss/Stressor/Fears (CP): Medical condition, self Techniques to Cope with Loss/Stress/Change: Counseling, Diversional activities Quality of Family Relationships: unable to assess Do you feel physically threatened by others?: No Uses rollator walker to help him ambulate    07/17/2024    PHQ2-9 Depression Screening   Little interest or pleasure in doing things More than half the days  Feeling down, depressed, or hopeless Several days  PHQ-2 - Total Score 3  Trouble falling or staying asleep, or sleeping too much Several days  Feeling tired or having little energy Several days  Poor appetite or overeating  Several days  Feeling bad about  yourself - or that you are a failure or have let yourself or your family down Several days  Trouble concentrating on things, such as reading the newspaper  or watching television Several days  Moving or speaking so slowly that other people could have noticed.  Or the opposite - being so fidgety or restless that you have been moving around a lot more than usual Several days  Thoughts that you would be better off dead, or hurting yourself in some way Not at all  PHQ2-9 Total Score 9  If you checked off any problems, how difficult have these problems made it for you to do your work, take care of things at home, or get along with other people Somewhat difficult  Depression Interventions/Treatment Currently on Treatment    Vitals:   Client said his BP readings usually run high Medications Reviewed Today     Reviewed by Frances Ozell GORMAN KEN (Social Worker) on 07/17/24 at 1312  Med List Status: <None>   Medication Order Taking? Sig Documenting Provider Last Dose Status Informant  Accu-Chek Softclix Lancets lancets [id number] Yes Use to check blood sugar twice per day. E11.9 Christia Budds, MD  Active Self, Pharmacy Records  albuterol  (VENTOLIN  HFA) 108 [id number] Base) MCG/ACT inhaler [id number] Yes Inhale 2 puffs into the lungs every 6 (six) hours as needed for wheezing or shortness of breath. Adele Song, MD  Active   atorvastatin  (LIPITOR) 40 MG tablet [id number] Yes Take 1 tablet (40 mg total) by mouth daily. Adele Song, MD  Active   Blood Glucose Monitoring Suppl (ACCU-CHEK GUIDE ME) w/Device KIT [id number] Yes See admin instructions. [provider]  Active Self, Pharmacy Records  calcitRIOL  (ROCALTROL ) 0.25 MCG capsule [id number] Yes Take 1 capsule (0.25 mcg total) by mouth Every Tuesday,Thursday,and Saturday with dialysis. Lonnie Earnest, MD  Active Self, Pharmacy Records             Calcium  Acetate 667 MG TABS [id number] Not taking Take 1,334 mg by mouth 3 (three) times daily.   Patient not taking: Reported on 07/17/2024   [provider]  Active Self, Pharmacy Records  carvedilol  (COREG ) 12.5 MG tablet [id number] Yes Take 1 tablet (12.5 mg total) by mouth 2 (two) times daily with a meal. Hindel, Leah, MD  Active   diltiazem  (CARDIZEM  CD) 300 MG 24 hr capsule [id number] Yes Take 1 capsule (300 mg total) by mouth daily. Adele Song, MD  Active   FLUoxetine  (PROZAC ) 40 MG capsule [id number] Yes Take 1 capsule (40 mg total) by mouth daily. Nygaard, Joseph, MD  Active   fluticasone  (FLONASE ) 50 MCG/ACT nasal spray [id number] Yes Place 2 sprays into both nostrils daily. Christia Budds, MD  Active Self, Pharmacy Records  Fluticasone -Umeclidin-Vilant (TRELEGY ELLIPTA ) 100-62.5-25 MCG/ACT AEPB [id number] Yes INHALE 1 PUFF INTO LUNGS DAILY Jagadish, Mayuri, MD  Active Self, Pharmacy Records  gabapentin  (NEURONTIN ) 300 MG capsule [id number] Yes TAKE 1 CAPSULE(300 MG) BY MOUTH DAILY Hindel, Leah, MD  Active   glucose blood (AGAMATRIX PRESTO TEST) test strip [id number] Yes Check blood sugars twice daily before meals Adella Norris, MD  Active Self, Pharmacy Records  glucose blood test strip [id number] Yes 1 each by Other route as needed for other. Use as instructed [provider]  Active Self, Pharmacy Records  hydrALAZINE  (APRESOLINE ) 100 MG tablet [id number] Yes Take 2 tablets (200 mg total) by mouth 2 (two) times daily. Baker, Amelia G, DO  Active   insulin  lispro (HUMALOG ) 100 UNIT/ML injection [id number] Yes 0-6 Units, Subcutaneous, 3 times daily with meals, First dose on Sun 10/17/23 at 0800 Correction coverage: Very Sensitive (ESRD/Dialysis) CBG < 70: Implement Hypoglycemia Standing Orders and refer to Hypoglycemia Standing  Orders sidebar report CBG 70 - 120: 0 units CBG 121 - 150: 0 units CBG 151 - 200: 1 unit CBG 201-250: 2 units CBG 251-300: 3 units CBG 301-350: 4 units CBG 351-400: 5 units CBG > 400: Give 6 units and call MD Lonnie, Mahnoor, MD  Active  Self, Pharmacy Records  loratadine  (CLARITIN ) 10 MG tablet [id number] Not taking  Take 1 tablet (10 mg total) by mouth daily.  Patient not taking: Reported on 07/17/2024   Isadora Hose, MD  Active Self, Pharmacy Records  losartan  (COZAAR ) 25 MG tablet [id number] Yes TAKE 1 TABLET(25 MG) BY MOUTH DAILY Jagadish, Mayuri, MD  Active Self, Pharmacy Records  methocarbamol  (ROBAXIN ) 750 MG tablet [id number] Yes Take 1 tablet (750 mg total) by mouth every 8 (eight) hours as needed for muscle spasms. Baker, Raguel MATSU, DO  Active   nicotine  (NICODERM CQ  - DOSED IN MG/24 HOURS) 14 mg/24hr patch [id number] Not taking  Place 1 patch (14 mg total) onto the skin daily.  Patient not taking: Reported on 07/17/2024   Barbarann Nest, MD  Active   ondansetron  (ZOFRAN -ODT) 4 MG disintegrating tablet [id number] Yes Take 1 tablet (4 mg total) by mouth every 8 (eight) hours as needed for nausea or vomiting. Christia Budds, MD  Active Self, Pharmacy Records  pantoprazole  (PROTONIX ) 40 MG tablet [id number] Yes TAKE 1 TABLET(40 MG) BY MOUTH DAILY Christia Budds, MD  Active Self, Pharmacy Records         Recommendation:   PCP Follow-up Continue Current Plan of Care Participate in stress reduction activities of choice to manage anxiety and stress issues faced Take medications as prescribed Call LCSW as needed for SW support  Follow Up Plan:   Telephone follow up appointment date/time:  08/29/2024 at 10:00 AM    Glendia Pear  MSW, LCSW [place] Licensed Clinical Social Worker Direct Dial :  [phone] Fax:  [phone] Website:  [email or web address]

## 2024-07-18 DIAGNOSIS — N186 End stage renal disease: Secondary | ICD-10-CM | POA: Diagnosis not present

## 2024-07-18 DIAGNOSIS — T8249XA Other complication of vascular dialysis catheter, initial encounter: Secondary | ICD-10-CM | POA: Diagnosis not present

## 2024-07-18 DIAGNOSIS — D689 Coagulation defect, unspecified: Secondary | ICD-10-CM | POA: Diagnosis not present

## 2024-07-18 DIAGNOSIS — I132 Hypertensive heart and chronic kidney disease with heart failure and with stage 5 chronic kidney disease, or end stage renal disease: Secondary | ICD-10-CM | POA: Diagnosis not present

## 2024-07-18 DIAGNOSIS — D631 Anemia in chronic kidney disease: Secondary | ICD-10-CM | POA: Diagnosis not present

## 2024-07-18 DIAGNOSIS — Z992 Dependence on renal dialysis: Secondary | ICD-10-CM | POA: Diagnosis not present

## 2024-07-18 DIAGNOSIS — N2581 Secondary hyperparathyroidism of renal origin: Secondary | ICD-10-CM | POA: Diagnosis not present

## 2024-07-20 ENCOUNTER — Encounter (HOSPITAL_BASED_OUTPATIENT_CLINIC_OR_DEPARTMENT_OTHER): Admitting: Internal Medicine

## 2024-07-20 DIAGNOSIS — T8131XD Disruption of external operation (surgical) wound, not elsewhere classified, subsequent encounter: Secondary | ICD-10-CM | POA: Diagnosis not present

## 2024-07-20 DIAGNOSIS — D689 Coagulation defect, unspecified: Secondary | ICD-10-CM | POA: Diagnosis not present

## 2024-07-20 DIAGNOSIS — T8131XA Disruption of external operation (surgical) wound, not elsewhere classified, initial encounter: Secondary | ICD-10-CM | POA: Diagnosis not present

## 2024-07-20 DIAGNOSIS — L0231 Cutaneous abscess of buttock: Secondary | ICD-10-CM | POA: Diagnosis not present

## 2024-07-20 DIAGNOSIS — I1311 Hypertensive heart and chronic kidney disease without heart failure, with stage 5 chronic kidney disease, or end stage renal disease: Secondary | ICD-10-CM | POA: Diagnosis not present

## 2024-07-20 DIAGNOSIS — Z992 Dependence on renal dialysis: Secondary | ICD-10-CM | POA: Diagnosis not present

## 2024-07-20 DIAGNOSIS — D631 Anemia in chronic kidney disease: Secondary | ICD-10-CM | POA: Diagnosis not present

## 2024-07-20 DIAGNOSIS — T8249XA Other complication of vascular dialysis catheter, initial encounter: Secondary | ICD-10-CM | POA: Diagnosis not present

## 2024-07-20 DIAGNOSIS — N2581 Secondary hyperparathyroidism of renal origin: Secondary | ICD-10-CM | POA: Diagnosis not present

## 2024-07-20 DIAGNOSIS — Z89432 Acquired absence of left foot: Secondary | ICD-10-CM | POA: Diagnosis not present

## 2024-07-20 DIAGNOSIS — N186 End stage renal disease: Secondary | ICD-10-CM | POA: Diagnosis not present

## 2024-07-20 DIAGNOSIS — I132 Hypertensive heart and chronic kidney disease with heart failure and with stage 5 chronic kidney disease, or end stage renal disease: Secondary | ICD-10-CM | POA: Diagnosis not present

## 2024-07-20 NOTE — Progress Notes (Signed)
    SUBJECTIVE:   CHIEF COMPLAINT / HPI:   Hypertension: - Medications: Carvedilol  12.5 mg twice daily, diltiazem  300 mg daily, hydralazine  200 mg twice daily, losartan  25 mg daily - Compliance: Yes - Denies any SOB, CP, vision changes, LE edema, medication SEs, or symptoms of hypotension ESRD on H, previously followed by [facility] but now states he is just followed by the nephrologist at the dialysis center.  Reports he is worried about his blood pressure going too low at times.  Reports he did previously take hydralazine  3 times per day but was missing the midday dose and worried it was dropping him too low.  Dyspnea Shortness of breath at times with cough. Worse when waking up. Started the past few weeks. Happening all the time and hard to catch breath. Been to all HD session, has not stopped them early. Makes a small amount of urine, maybe once per day. A little bit of chest pain at times but not right now. Feels like he has more volume on board. Not sure what dry weight is, usually told he is over his dry weight.  Notes he has swelling in his leg and his hands.  PERTINENT  PMH / PSH: ESRD on HD, T2DM, resistant HTN  OBJECTIVE:   BP (!) 158/80   Pulse 77   Ht 6' 5 (1.956 m)   Wt 237 lb 12.8 oz (107.9 kg)   SpO2 95%   BMI 28.20 kg/m    General: NAD, pleasant, able to participate in exam Cardiac: RRR, no murmurs. Respiratory: CTAB, normal effort, No wheezes, rales or rhonchi Abdomen: Bowel sounds present, nontender, nondistended Extremities: Right BKA.  Left leg with 1+ pitting edema present.  Left arm with minimal to 1+ pitting edema present. Skin: warm and dry, no rashes noted Neuro: alert, no obvious focal deficits Psych: Normal affect and mood  ASSESSMENT/PLAN:   Assessment & Plan Severe hypertension 158/80 in clinic upon repeat, suboptimal control but unclear what patient's blood pressure does during HD.  Hesitant to make BP med changes given variability and  discussed with patient his blood pressure would be better managed by nephrology given their frequent monitoring of him.  Will continue current medication regimen and send referral for outpatient nephrology follow-up for second opinion. -Referral to nephrology, [facility] given previously seen by Dr. Sari for blood pressure and volume evaluation -Could consider addition of clonidine patch if unable to be seen ESRD (end stage renal disease) on dialysis (HCC) TTS schedule, unclear what his dry weight is but has not missed an session or cut them short. Dyspnea, unspecified type Likely related to increased volume and being consistently above his dry weight, particularly given worse with laying flat.  As above will need discussion with nephrology given volume status and if adjustment to his HD regimen is needed given he makes very little urine.   Dr. Izetta Nap, DO [facility]

## 2024-07-21 ENCOUNTER — Ambulatory Visit: Admitting: Family Medicine

## 2024-07-21 ENCOUNTER — Encounter: Payer: Self-pay | Admitting: Family Medicine

## 2024-07-21 VITALS — BP 158/80 | HR 77 | Ht 77.0 in | Wt 237.8 lb

## 2024-07-21 DIAGNOSIS — R262 Difficulty in walking, not elsewhere classified: Secondary | ICD-10-CM | POA: Diagnosis not present

## 2024-07-21 DIAGNOSIS — Z992 Dependence on renal dialysis: Secondary | ICD-10-CM

## 2024-07-21 DIAGNOSIS — R0600 Dyspnea, unspecified: Secondary | ICD-10-CM

## 2024-07-21 DIAGNOSIS — N186 End stage renal disease: Secondary | ICD-10-CM | POA: Diagnosis not present

## 2024-07-21 DIAGNOSIS — I10 Essential (primary) hypertension: Secondary | ICD-10-CM

## 2024-07-21 DIAGNOSIS — M25561 Pain in right knee: Secondary | ICD-10-CM | POA: Diagnosis not present

## 2024-07-21 NOTE — Assessment & Plan Note (Signed)
 158/80 in clinic upon repeat, suboptimal control but unclear what patient's blood pressure does during HD.  Hesitant to make BP med changes given variability and discussed with patient his blood pressure would be better managed by nephrology given their frequent monitoring of him.  Will continue current medication regimen and send referral for outpatient nephrology follow-up for second opinion. -Referral to nephrology, [facility] given previously seen by Dr. Sari for blood pressure and volume evaluation -Could consider addition of clonidine patch if unable to be seen

## 2024-07-21 NOTE — Assessment & Plan Note (Signed)
 TTS schedule, unclear what his dry weight is but has not missed an session or cut them short.

## 2024-07-21 NOTE — Patient Instructions (Signed)
 It was wonderful to see you today! Thank you for choosing [facility].   Please bring ALL of your medications with you to every visit.   Today we talked about:  As we discussed I think your blood pressure is better managed by a nephrologist to give you the best care possible given you are seen in dialysis 3 times per week.  I worry about adjusting your medications with the fluctuations in your volume status pre and postdialysis for making your blood pressure go too low.  Ultimately I think you need to be seen by nephrology, I placed a referral for a second opinion so they can weigh in on your care. Additionally I think your shortness of breath is due to your volume status issues given you are also having swelling with this.  This is again a dialysis issue given you are not really making much urine as they need to adjust the amount of fluid they are taking off during your sessions.  This is something that needs to be discussed with the nephrologist and I placed the referral as above. If you have sustained shortness of breath and chest pain that is not going away please go to the emergency department for evaluation.  Please follow up in 1 months if not seen by Nephrology  Call the clinic at [phone] if your symptoms worsen or you have any concerns.  Please be sure to schedule follow up at the front desk before you leave today.   Izetta Nap, DO Family Medicine

## 2024-07-22 DIAGNOSIS — I132 Hypertensive heart and chronic kidney disease with heart failure and with stage 5 chronic kidney disease, or end stage renal disease: Secondary | ICD-10-CM | POA: Diagnosis not present

## 2024-07-22 DIAGNOSIS — Z992 Dependence on renal dialysis: Secondary | ICD-10-CM | POA: Diagnosis not present

## 2024-07-22 DIAGNOSIS — T8249XA Other complication of vascular dialysis catheter, initial encounter: Secondary | ICD-10-CM | POA: Diagnosis not present

## 2024-07-22 DIAGNOSIS — D689 Coagulation defect, unspecified: Secondary | ICD-10-CM | POA: Diagnosis not present

## 2024-07-22 DIAGNOSIS — D631 Anemia in chronic kidney disease: Secondary | ICD-10-CM | POA: Diagnosis not present

## 2024-07-22 DIAGNOSIS — N2581 Secondary hyperparathyroidism of renal origin: Secondary | ICD-10-CM | POA: Diagnosis not present

## 2024-07-23 DIAGNOSIS — Z992 Dependence on renal dialysis: Secondary | ICD-10-CM | POA: Diagnosis not present

## 2024-07-23 DIAGNOSIS — I129 Hypertensive chronic kidney disease with stage 1 through stage 4 chronic kidney disease, or unspecified chronic kidney disease: Secondary | ICD-10-CM | POA: Diagnosis not present

## 2024-07-23 DIAGNOSIS — N186 End stage renal disease: Secondary | ICD-10-CM | POA: Diagnosis not present

## 2024-07-25 ENCOUNTER — Ambulatory Visit

## 2024-07-25 DIAGNOSIS — T8249XA Other complication of vascular dialysis catheter, initial encounter: Secondary | ICD-10-CM | POA: Diagnosis not present

## 2024-07-25 DIAGNOSIS — D509 Iron deficiency anemia, unspecified: Secondary | ICD-10-CM | POA: Diagnosis not present

## 2024-07-25 DIAGNOSIS — N2581 Secondary hyperparathyroidism of renal origin: Secondary | ICD-10-CM | POA: Diagnosis not present

## 2024-07-25 DIAGNOSIS — N186 End stage renal disease: Secondary | ICD-10-CM | POA: Diagnosis not present

## 2024-07-25 DIAGNOSIS — D689 Coagulation defect, unspecified: Secondary | ICD-10-CM | POA: Diagnosis not present

## 2024-07-25 DIAGNOSIS — Z992 Dependence on renal dialysis: Secondary | ICD-10-CM | POA: Diagnosis not present

## 2024-07-25 DIAGNOSIS — D631 Anemia in chronic kidney disease: Secondary | ICD-10-CM | POA: Diagnosis not present

## 2024-07-25 DIAGNOSIS — M71061 Abscess of bursa, right knee: Secondary | ICD-10-CM | POA: Diagnosis not present

## 2024-07-26 ENCOUNTER — Telehealth: Payer: Self-pay

## 2024-07-26 ENCOUNTER — Ambulatory Visit: Attending: Vascular Surgery | Admitting: Physician Assistant

## 2024-07-26 VITALS — BP 192/94 | HR 78 | Temp 98.0°F | Wt 232.9 lb

## 2024-07-26 DIAGNOSIS — I10 Essential (primary) hypertension: Secondary | ICD-10-CM

## 2024-07-26 DIAGNOSIS — T8131XD Disruption of external operation (surgical) wound, not elsewhere classified, subsequent encounter: Secondary | ICD-10-CM

## 2024-07-26 NOTE — Progress Notes (Signed)
 POST OPERATIVE OFFICE NOTE    CC:  F/u for surgery  HPI:  [name] [name] is a 53 y.o. male who is here for repeat wound check.  He has a history of left second stage basilic vein transposition.  He developed a seroma postoperatively and was brought back to the operating room on 05/17/2024 for irrigation and debridement with incisional VAC placement.  This was removed after a week however he continued to drain serous fluid from the incision.  He then had a peel and place VAC placed on his incision in office on 05/24/2024.  He returned to our clinic on a weekly basis for VAC changes until 06/20/2024.  He has also been placed on a 2-week course of doxycycline .  Unfortunately his VAC changes made minimal improvement with his incision healing and he was switched to dry dressing changes.  He was also encouraged to cut back or stop smoking in efforts to heal his incision, however he was unwilling.   He returns today for follow-up.  He feels like his left arm wound is finally starting to heal.  He says his wound scabbed over about 2 weeks ago and no longer has any serous drainage.  He denies any fevers or chills.   Allergies  Allergen Reactions   Iron  Nausea And Vomiting    With the iron  tablets   Tape Rash    Rash at site of tape--okay to use paper tape    Current Outpatient Medications  Medication Sig Dispense Refill   Accu-Chek Softclix Lancets lancets Use to check blood sugar twice per day. E11.9 100 each 12   albuterol  (VENTOLIN  HFA) 108 (90 Base) MCG/ACT inhaler Inhale 2 puffs into the lungs every 6 (six) hours as needed for wheezing or shortness of breath. 8 g 2   atorvastatin  (LIPITOR) 40 MG tablet Take 1 tablet (40 mg total) by mouth daily. 90 tablet 3   Blood Glucose Monitoring Suppl (ACCU-CHEK GUIDE ME) w/Device KIT See admin instructions.     calcitRIOL  (ROCALTROL ) 0.25 MCG capsule Take 1 capsule (0.25 mcg total) by mouth Every Tuesday,Thursday,and Saturday with dialysis. 30 capsule 0    Calcium  Acetate 667 MG TABS Take 1,334 mg by mouth 3 (three) times daily. (Patient not taking: Reported on 07/17/2024)     carvedilol  (COREG ) 12.5 MG tablet Take 1 tablet (12.5 mg total) by mouth 2 (two) times daily with a meal. 90 tablet 0   diltiazem  (CARDIZEM  CD) 300 MG 24 hr capsule Take 1 capsule (300 mg total) by mouth daily. 90 capsule 0   FLUoxetine  (PROZAC ) 40 MG capsule Take 1 capsule (40 mg total) by mouth daily. 90 capsule 3   fluticasone  (FLONASE ) 50 MCG/ACT nasal spray Place 2 sprays into both nostrils daily. 16 g 6   Fluticasone -Umeclidin-Vilant (TRELEGY ELLIPTA ) 100-62.5-25 MCG/ACT AEPB INHALE 1 PUFF INTO LUNGS DAILY 60 each 3   gabapentin  (NEURONTIN ) 300 MG capsule TAKE 1 CAPSULE(300 MG) BY MOUTH DAILY 30 capsule 0   glucose blood (AGAMATRIX PRESTO TEST) test strip Check blood sugars twice daily before meals 100 each 12   glucose blood test strip 1 each by Other route as needed for other. Use as instructed     hydrALAZINE  (APRESOLINE ) 100 MG tablet Take 2 tablets (200 mg total) by mouth 2 (two) times daily. 120 tablet 1   insulin  lispro (HUMALOG ) 100 UNIT/ML injection 0-6 Units, Subcutaneous, 3 times daily with meals, First dose on Sun 10/17/23 at 0800 Correction coverage: Very Sensitive (ESRD/Dialysis) CBG < 70:  Implement Hypoglycemia Standing Orders and refer to Hypoglycemia Standing Orders sidebar report CBG 70 - 120: 0 units CBG 121 - 150: 0 units CBG 151 - 200: 1 unit CBG 201-250: 2 units CBG 251-300: 3 units CBG 301-350: 4 units CBG 351-400: 5 units CBG > 400: Give 6 units and call MD     loratadine  (CLARITIN ) 10 MG tablet Take 1 tablet (10 mg total) by mouth daily. (Patient not taking: Reported on 07/17/2024) 30 tablet 11   losartan  (COZAAR ) 25 MG tablet TAKE 1 TABLET(25 MG) BY MOUTH DAILY 90 tablet 0   methocarbamol  (ROBAXIN ) 750 MG tablet Take 1 tablet (750 mg total) by mouth every 8 (eight) hours as needed for muscle spasms. 30 tablet 0   nicotine  (NICODERM CQ  - DOSED  IN MG/24 HOURS) 14 mg/24hr patch Place 1 patch (14 mg total) onto the skin daily. (Patient not taking: Reported on 07/17/2024) 28 patch 0   ondansetron  (ZOFRAN -ODT) 4 MG disintegrating tablet Take 1 tablet (4 mg total) by mouth every 8 (eight) hours as needed for nausea or vomiting. 20 tablet 0   pantoprazole  (PROTONIX ) 40 MG tablet TAKE 1 TABLET(40 MG) BY MOUTH DAILY 90 tablet 0   No current facility-administered medications for this visit.     ROS:  See HPI  Physical Exam:   Incision: Left arm incision is slowly healing with a dry scab and no serous drainage Extremities: Great thrill in left brachiobasilic fistula. 2+ left radial pulse Neuro: Intact motor and sensation of left hand      Assessment/Plan:  This is a 53 y.o. male who is here for wound check  - The patient has a history of seroma formation after left second stage basilic vein transposition.  It has been several weeks since his surgery and he has been very slow to heal his left arm wound - At today's visit his left arm wound appears smaller than it did 2 weeks ago.  He no longer has any serous drainage from the incision.  His wound has now scabbed over. - His left brachiobasilic fistula continues to have a great thrill in it.  Unfortunately this cannot be used until his left arm wound completely heals - He will continue to keep his left arm wound clean and dry.  He can return to clinic in 1 month for repeat wound check.  Hopefully at that time his arm is completely healed and his fistula can be used.   Ahmed Holster, PA-C [facility] [phone]   Clinic MD:  Sheree

## 2024-07-26 NOTE — Telephone Encounter (Signed)
 Patient calls nurse line requesting refills on Flomax  and Losartan .   I do not see Flomax  on his medication list.   Will forward to PCP to advise.

## 2024-07-27 ENCOUNTER — Encounter (HOSPITAL_BASED_OUTPATIENT_CLINIC_OR_DEPARTMENT_OTHER): Payer: Self-pay | Admitting: Internal Medicine

## 2024-07-27 ENCOUNTER — Other Ambulatory Visit: Payer: Self-pay | Admitting: Family Medicine

## 2024-07-27 DIAGNOSIS — N2581 Secondary hyperparathyroidism of renal origin: Secondary | ICD-10-CM | POA: Diagnosis not present

## 2024-07-27 DIAGNOSIS — D631 Anemia in chronic kidney disease: Secondary | ICD-10-CM | POA: Diagnosis not present

## 2024-07-27 DIAGNOSIS — R339 Retention of urine, unspecified: Secondary | ICD-10-CM

## 2024-07-27 DIAGNOSIS — Z992 Dependence on renal dialysis: Secondary | ICD-10-CM | POA: Diagnosis not present

## 2024-07-27 DIAGNOSIS — D689 Coagulation defect, unspecified: Secondary | ICD-10-CM | POA: Diagnosis not present

## 2024-07-27 DIAGNOSIS — T8249XA Other complication of vascular dialysis catheter, initial encounter: Secondary | ICD-10-CM | POA: Diagnosis not present

## 2024-07-27 DIAGNOSIS — N186 End stage renal disease: Secondary | ICD-10-CM | POA: Diagnosis not present

## 2024-07-27 DIAGNOSIS — M71061 Abscess of bursa, right knee: Secondary | ICD-10-CM | POA: Diagnosis not present

## 2024-07-27 DIAGNOSIS — D509 Iron deficiency anemia, unspecified: Secondary | ICD-10-CM | POA: Diagnosis not present

## 2024-07-27 MED ORDER — TAMSULOSIN HCL 0.4 MG PO CAPS: 0.4000 mg | ORAL_CAPSULE | Freq: Every day | ORAL | 3 refills | Status: DC

## 2024-07-27 MED ORDER — LOSARTAN POTASSIUM 25 MG PO TABS: 25.0000 mg | ORAL_TABLET | Freq: Every day | ORAL | 3 refills | Status: DC

## 2024-07-28 ENCOUNTER — Other Ambulatory Visit: Payer: Self-pay | Admitting: Family Medicine

## 2024-07-29 ENCOUNTER — Emergency Department (HOSPITAL_COMMUNITY)

## 2024-07-29 ENCOUNTER — Other Ambulatory Visit: Payer: Self-pay

## 2024-07-29 ENCOUNTER — Inpatient Hospital Stay (HOSPITAL_COMMUNITY)

## 2024-07-29 ENCOUNTER — Encounter (HOSPITAL_COMMUNITY): Payer: Self-pay | Admitting: *Deleted

## 2024-07-29 ENCOUNTER — Inpatient Hospital Stay (HOSPITAL_COMMUNITY)
Admission: EM | Admit: 2024-07-29 | Discharge: 2024-08-09 | DRG: 463 | Disposition: A | Discharge status: Skilled Nursing Facility | Source: Ambulatory Visit | Attending: Family Medicine | Admitting: Family Medicine

## 2024-07-29 DIAGNOSIS — Y712 Prosthetic and other implants, materials and accessory cardiovascular devices associated with adverse incidents: Secondary | ICD-10-CM | POA: Diagnosis present

## 2024-07-29 DIAGNOSIS — E1165 Type 2 diabetes mellitus with hyperglycemia: Secondary | ICD-10-CM | POA: Diagnosis present

## 2024-07-29 DIAGNOSIS — J810 Acute pulmonary edema: Secondary | ICD-10-CM | POA: Diagnosis not present

## 2024-07-29 DIAGNOSIS — R339 Retention of urine, unspecified: Secondary | ICD-10-CM | POA: Diagnosis not present

## 2024-07-29 DIAGNOSIS — T8743 Infection of amputation stump, right lower extremity: Secondary | ICD-10-CM | POA: Diagnosis not present

## 2024-07-29 DIAGNOSIS — I10 Essential (primary) hypertension: Secondary | ICD-10-CM | POA: Diagnosis present

## 2024-07-29 DIAGNOSIS — T8249XA Other complication of vascular dialysis catheter, initial encounter: Secondary | ICD-10-CM | POA: Diagnosis not present

## 2024-07-29 DIAGNOSIS — N2581 Secondary hyperparathyroidism of renal origin: Secondary | ICD-10-CM | POA: Diagnosis not present

## 2024-07-29 DIAGNOSIS — J90 Pleural effusion, not elsewhere classified: Secondary | ICD-10-CM | POA: Diagnosis present

## 2024-07-29 DIAGNOSIS — M726 Necrotizing fasciitis: Secondary | ICD-10-CM | POA: Diagnosis not present

## 2024-07-29 DIAGNOSIS — R001 Bradycardia, unspecified: Secondary | ICD-10-CM | POA: Diagnosis present

## 2024-07-29 DIAGNOSIS — Z89422 Acquired absence of other left toe(s): Secondary | ICD-10-CM

## 2024-07-29 DIAGNOSIS — W19XXXA Unspecified fall, initial encounter: Secondary | ICD-10-CM | POA: Diagnosis not present

## 2024-07-29 DIAGNOSIS — E875 Hyperkalemia: Secondary | ICD-10-CM | POA: Diagnosis not present

## 2024-07-29 DIAGNOSIS — Z992 Dependence on renal dialysis: Secondary | ICD-10-CM

## 2024-07-29 DIAGNOSIS — R0989 Other specified symptoms and signs involving the circulatory and respiratory systems: Secondary | ICD-10-CM | POA: Diagnosis not present

## 2024-07-29 DIAGNOSIS — Z79899 Other long term (current) drug therapy: Secondary | ICD-10-CM | POA: Diagnosis not present

## 2024-07-29 DIAGNOSIS — J449 Chronic obstructive pulmonary disease, unspecified: Secondary | ICD-10-CM | POA: Diagnosis not present

## 2024-07-29 DIAGNOSIS — Y838 Other surgical procedures as the cause of abnormal reaction of the patient, or of later complication, without mention of misadventure at the time of the procedure: Secondary | ICD-10-CM | POA: Diagnosis present

## 2024-07-29 DIAGNOSIS — R0602 Shortness of breath: Secondary | ICD-10-CM | POA: Diagnosis not present

## 2024-07-29 DIAGNOSIS — E1122 Type 2 diabetes mellitus with diabetic chronic kidney disease: Secondary | ICD-10-CM | POA: Diagnosis not present

## 2024-07-29 DIAGNOSIS — Y835 Amputation of limb(s) as the cause of abnormal reaction of the patient, or of later complication, without mention of misadventure at the time of the procedure: Secondary | ICD-10-CM | POA: Diagnosis present

## 2024-07-29 DIAGNOSIS — I132 Hypertensive heart and chronic kidney disease with heart failure and with stage 5 chronic kidney disease, or end stage renal disease: Secondary | ICD-10-CM | POA: Diagnosis present

## 2024-07-29 DIAGNOSIS — R0601 Orthopnea: Secondary | ICD-10-CM | POA: Diagnosis not present

## 2024-07-29 DIAGNOSIS — E785 Hyperlipidemia, unspecified: Secondary | ICD-10-CM | POA: Diagnosis present

## 2024-07-29 DIAGNOSIS — R600 Localized edema: Secondary | ICD-10-CM | POA: Diagnosis not present

## 2024-07-29 DIAGNOSIS — D689 Coagulation defect, unspecified: Secondary | ICD-10-CM | POA: Diagnosis not present

## 2024-07-29 DIAGNOSIS — Z8249 Family history of ischemic heart disease and other diseases of the circulatory system: Secondary | ICD-10-CM

## 2024-07-29 DIAGNOSIS — L0291 Cutaneous abscess, unspecified: Secondary | ICD-10-CM | POA: Diagnosis not present

## 2024-07-29 DIAGNOSIS — M85861 Other specified disorders of bone density and structure, right lower leg: Secondary | ICD-10-CM | POA: Diagnosis not present

## 2024-07-29 DIAGNOSIS — Z83719 Family history of colon polyps, unspecified: Secondary | ICD-10-CM

## 2024-07-29 DIAGNOSIS — D638 Anemia in other chronic diseases classified elsewhere: Secondary | ICD-10-CM | POA: Diagnosis not present

## 2024-07-29 DIAGNOSIS — N186 End stage renal disease: Secondary | ICD-10-CM

## 2024-07-29 DIAGNOSIS — E1151 Type 2 diabetes mellitus with diabetic peripheral angiopathy without gangrene: Secondary | ICD-10-CM | POA: Diagnosis present

## 2024-07-29 DIAGNOSIS — I152 Hypertension secondary to endocrine disorders: Secondary | ICD-10-CM | POA: Diagnosis present

## 2024-07-29 DIAGNOSIS — E46 Unspecified protein-calorie malnutrition: Secondary | ICD-10-CM | POA: Diagnosis not present

## 2024-07-29 DIAGNOSIS — D509 Iron deficiency anemia, unspecified: Secondary | ICD-10-CM | POA: Diagnosis not present

## 2024-07-29 DIAGNOSIS — K2100 Gastro-esophageal reflux disease with esophagitis, without bleeding: Secondary | ICD-10-CM | POA: Diagnosis not present

## 2024-07-29 DIAGNOSIS — R918 Other nonspecific abnormal finding of lung field: Secondary | ICD-10-CM | POA: Diagnosis not present

## 2024-07-29 DIAGNOSIS — I471 Supraventricular tachycardia, unspecified: Secondary | ICD-10-CM | POA: Diagnosis present

## 2024-07-29 DIAGNOSIS — E118 Type 2 diabetes mellitus with unspecified complications: Secondary | ICD-10-CM | POA: Diagnosis not present

## 2024-07-29 DIAGNOSIS — F17210 Nicotine dependence, cigarettes, uncomplicated: Secondary | ICD-10-CM | POA: Diagnosis present

## 2024-07-29 DIAGNOSIS — Z83438 Family history of other disorder of lipoprotein metabolism and other lipidemia: Secondary | ICD-10-CM

## 2024-07-29 DIAGNOSIS — Z91048 Other nonmedicinal substance allergy status: Secondary | ICD-10-CM

## 2024-07-29 DIAGNOSIS — J811 Chronic pulmonary edema: Secondary | ICD-10-CM | POA: Diagnosis not present

## 2024-07-29 DIAGNOSIS — L02415 Cutaneous abscess of right lower limb: Secondary | ICD-10-CM | POA: Diagnosis not present

## 2024-07-29 DIAGNOSIS — D631 Anemia in chronic kidney disease: Secondary | ICD-10-CM | POA: Diagnosis not present

## 2024-07-29 DIAGNOSIS — Z5941 Food insecurity: Secondary | ICD-10-CM

## 2024-07-29 DIAGNOSIS — E8809 Other disorders of plasma-protein metabolism, not elsewhere classified: Secondary | ICD-10-CM | POA: Diagnosis not present

## 2024-07-29 DIAGNOSIS — E871 Hypo-osmolality and hyponatremia: Secondary | ICD-10-CM | POA: Diagnosis not present

## 2024-07-29 DIAGNOSIS — M8580 Other specified disorders of bone density and structure, unspecified site: Secondary | ICD-10-CM | POA: Diagnosis not present

## 2024-07-29 DIAGNOSIS — Z560 Unemployment, unspecified: Secondary | ICD-10-CM

## 2024-07-29 DIAGNOSIS — R5383 Other fatigue: Secondary | ICD-10-CM | POA: Diagnosis not present

## 2024-07-29 DIAGNOSIS — M71061 Abscess of bursa, right knee: Secondary | ICD-10-CM | POA: Diagnosis not present

## 2024-07-29 DIAGNOSIS — Z89511 Acquired absence of right leg below knee: Secondary | ICD-10-CM | POA: Diagnosis not present

## 2024-07-29 DIAGNOSIS — Z794 Long term (current) use of insulin: Secondary | ICD-10-CM

## 2024-07-29 DIAGNOSIS — I517 Cardiomegaly: Secondary | ICD-10-CM | POA: Diagnosis not present

## 2024-07-29 DIAGNOSIS — R0600 Dyspnea, unspecified: Secondary | ICD-10-CM | POA: Diagnosis not present

## 2024-07-29 DIAGNOSIS — I739 Peripheral vascular disease, unspecified: Secondary | ICD-10-CM | POA: Diagnosis not present

## 2024-07-29 DIAGNOSIS — Z833 Family history of diabetes mellitus: Secondary | ICD-10-CM

## 2024-07-29 DIAGNOSIS — F32A Depression, unspecified: Secondary | ICD-10-CM | POA: Diagnosis present

## 2024-07-29 DIAGNOSIS — K219 Gastro-esophageal reflux disease without esophagitis: Secondary | ICD-10-CM | POA: Diagnosis not present

## 2024-07-29 DIAGNOSIS — Z818 Family history of other mental and behavioral disorders: Secondary | ICD-10-CM

## 2024-07-29 DIAGNOSIS — T8249XD Other complication of vascular dialysis catheter, subsequent encounter: Secondary | ICD-10-CM | POA: Diagnosis not present

## 2024-07-29 DIAGNOSIS — M6281 Muscle weakness (generalized): Secondary | ICD-10-CM | POA: Diagnosis not present

## 2024-07-29 DIAGNOSIS — Z789 Other specified health status: Secondary | ICD-10-CM

## 2024-07-29 DIAGNOSIS — Z888 Allergy status to other drugs, medicaments and biological substances status: Secondary | ICD-10-CM

## 2024-07-29 DIAGNOSIS — Z743 Need for continuous supervision: Secondary | ICD-10-CM | POA: Diagnosis not present

## 2024-07-29 DIAGNOSIS — Z5986 Financial insecurity: Secondary | ICD-10-CM

## 2024-07-29 DIAGNOSIS — I120 Hypertensive chronic kidney disease with stage 5 chronic kidney disease or end stage renal disease: Secondary | ICD-10-CM | POA: Diagnosis not present

## 2024-07-29 DIAGNOSIS — E1140 Type 2 diabetes mellitus with diabetic neuropathy, unspecified: Secondary | ICD-10-CM | POA: Diagnosis not present

## 2024-07-29 HISTORY — DX: Anemia in other chronic diseases classified elsewhere: D63.8

## 2024-07-29 HISTORY — DX: Other complication of vascular dialysis catheter, initial encounter: T82.49XA

## 2024-07-29 LAB — BASIC METABOLIC PANEL WITH GFR
Anion gap: 15 (ref 5–15)
BUN: 14 mg/dL (ref 6–20)
CO2: 22 mmol/L (ref 22–32)
Calcium: 8.5 mg/dL — ABNORMAL LOW (ref 8.9–10.3)
Chloride: 102 mmol/L (ref 98–111)
Creatinine, Ser: 4.28 mg/dL — ABNORMAL HIGH (ref 0.61–1.24)
GFR, Estimated: 16 mL/min — ABNORMAL LOW (ref >60)
Glucose, Bld: 249 mg/dL — ABNORMAL HIGH (ref 70–99)
Potassium: 4 mmol/L (ref 3.5–5.1)
Sodium: 139 mmol/L (ref 135–145)

## 2024-07-29 LAB — CBC WITH DIFFERENTIAL/PLATELET
Abs Immature Granulocytes: 0.04 K/uL (ref 0.00–0.07)
Basophils Absolute: 0 K/uL (ref 0.0–0.1)
Basophils Relative: 0 %
Eosinophils Absolute: 0.1 K/uL (ref 0.0–0.5)
Eosinophils Relative: 1 %
HCT: 27.3 % — ABNORMAL LOW (ref 39.0–52.0)
Hemoglobin: 7.8 g/dL — ABNORMAL LOW (ref 13.0–17.0)
Immature Granulocytes: 1 %
Lymphocytes Relative: 8 %
Lymphs Abs: 0.7 K/uL (ref 0.7–4.0)
MCH: 21.7 pg — ABNORMAL LOW (ref 26.0–34.0)
MCHC: 28.6 g/dL — ABNORMAL LOW (ref 30.0–36.0)
MCV: 76 fL — ABNORMAL LOW (ref 80.0–100.0)
Monocytes Absolute: 0.6 K/uL (ref 0.1–1.0)
Monocytes Relative: 7 %
Neutro Abs: 7.1 K/uL (ref 1.7–7.7)
Neutrophils Relative %: 83 %
Platelets: 111 K/uL — ABNORMAL LOW (ref 150–400)
RBC: 3.59 MIL/uL — ABNORMAL LOW (ref 4.22–5.81)
RDW: 18.4 % — ABNORMAL HIGH (ref 11.5–15.5)
WBC: 8.6 K/uL (ref 4.0–10.5)
nRBC: 0 % (ref 0.0–0.2)

## 2024-07-29 LAB — GLUCOSE, CAPILLARY
Glucose-Capillary: 235 mg/dL — ABNORMAL HIGH (ref 70–99)
Glucose-Capillary: 243 mg/dL — ABNORMAL HIGH (ref 70–99)

## 2024-07-29 MED ORDER — DILTIAZEM HCL ER COATED BEADS 180 MG PO CP24
300.0000 mg | ORAL_CAPSULE | Freq: Every day | ORAL | Status: DC
  Administered 2024-07-29 – 2024-08-09 (×11): 300 mg via ORAL
  Filled 2024-07-29 (×12): qty 1

## 2024-07-29 MED ORDER — OXYCODONE HCL 5 MG PO TABS
5.0000 mg | ORAL_TABLET | Freq: Four times a day (QID) | ORAL | Status: DC | PRN
  Administered 2024-07-29 – 2024-08-03 (×15): 5 mg via ORAL
  Filled 2024-07-29 (×15): qty 1

## 2024-07-29 MED ORDER — OXYCODONE HCL 5 MG PO TABS: 2.5000 mg | ORAL_TABLET | Freq: Four times a day (QID) | ORAL | Status: DC | PRN

## 2024-07-29 MED ORDER — HYDRALAZINE HCL 50 MG PO TABS
200.0000 mg | ORAL_TABLET | Freq: Two times a day (BID) | ORAL | Status: DC
  Administered 2024-07-29 – 2024-08-09 (×18): 200 mg via ORAL
  Filled 2024-07-29 (×23): qty 4

## 2024-07-29 MED ORDER — ACETAMINOPHEN 650 MG RE SUPP: 650.0000 mg | Freq: Four times a day (QID) | RECTAL | Status: DC | PRN

## 2024-07-29 MED ORDER — HEPARIN SODIUM (PORCINE) 5000 UNIT/ML IJ SOLN
5000.0000 [IU] | Freq: Three times a day (TID) | INTRAMUSCULAR | Status: DC
  Administered 2024-07-29 – 2024-08-09 (×24): 5000 [IU] via SUBCUTANEOUS
  Filled 2024-07-29 (×25): qty 1

## 2024-07-29 MED ORDER — FUROSEMIDE 40 MG PO TABS
40.0000 mg | ORAL_TABLET | Freq: Two times a day (BID) | ORAL | Status: DC
  Administered 2024-07-29 – 2024-08-09 (×17): 40 mg via ORAL
  Filled 2024-07-29 (×18): qty 1

## 2024-07-29 MED ORDER — ACETAMINOPHEN 325 MG PO TABS
650.0000 mg | ORAL_TABLET | Freq: Four times a day (QID) | ORAL | Status: DC | PRN
Start: 2024-07-29 — End: 2024-08-04
  Administered 2024-08-02 – 2024-08-03 (×4): 650 mg via ORAL
  Filled 2024-07-29 (×4): qty 2

## 2024-07-29 MED ORDER — VANCOMYCIN HCL 2000 MG/400ML IV SOLN
2000.0000 mg | Freq: Once | INTRAVENOUS | Status: AC
  Administered 2024-07-29: 2000 mg via INTRAVENOUS
  Filled 2024-07-29: qty 400

## 2024-07-29 MED ORDER — FUROSEMIDE 40 MG PO TABS: 40.0000 mg | ORAL_TABLET | Freq: Two times a day (BID) | ORAL | Status: DC

## 2024-07-29 MED ORDER — FLUOXETINE HCL 20 MG PO CAPS
40.0000 mg | ORAL_CAPSULE | Freq: Every day | ORAL | Status: DC
  Administered 2024-07-30 – 2024-08-09 (×11): 40 mg via ORAL
  Filled 2024-07-29 (×12): qty 2

## 2024-07-29 MED ORDER — ACETAMINOPHEN 325 MG PO TABS
650.0000 mg | ORAL_TABLET | Freq: Once | ORAL | Status: AC
  Administered 2024-07-29: 650 mg via ORAL
  Filled 2024-07-29: qty 2

## 2024-07-29 MED ORDER — ATORVASTATIN CALCIUM 40 MG PO TABS
40.0000 mg | ORAL_TABLET | Freq: Every day | ORAL | Status: DC
  Administered 2024-07-29 – 2024-08-09 (×12): 40 mg via ORAL
  Filled 2024-07-29 (×12): qty 1

## 2024-07-29 MED ORDER — LOSARTAN POTASSIUM 50 MG PO TABS
25.0000 mg | ORAL_TABLET | Freq: Every day | ORAL | Status: DC
  Administered 2024-07-30 – 2024-08-07 (×9): 25 mg via ORAL
  Filled 2024-07-29 (×9): qty 1

## 2024-07-29 MED ORDER — GUAIFENESIN 200 MG PO TABS
200.0000 mg | ORAL_TABLET | Freq: Once | ORAL | Status: AC
  Administered 2024-07-29: 200 mg via ORAL
  Filled 2024-07-29: qty 1

## 2024-07-29 MED ORDER — INSULIN ASPART 100 UNIT/ML IJ SOLN
0.0000 [IU] | Freq: Three times a day (TID) | INTRAMUSCULAR | Status: DC
  Administered 2024-07-29 – 2024-07-30 (×3): 2 [IU] via SUBCUTANEOUS
  Administered 2024-07-30 – 2024-07-31 (×2): 4 [IU] via SUBCUTANEOUS
  Administered 2024-07-31: 3 [IU] via SUBCUTANEOUS
  Administered 2024-08-01: 2 [IU] via SUBCUTANEOUS
  Administered 2024-08-02: 4 [IU] via SUBCUTANEOUS
  Administered 2024-08-02: 1 [IU] via SUBCUTANEOUS
  Administered 2024-08-03 – 2024-08-04 (×3): 2 [IU] via SUBCUTANEOUS
  Administered 2024-08-04: 5 [IU] via SUBCUTANEOUS
  Administered 2024-08-05: 3 [IU] via SUBCUTANEOUS
  Administered 2024-08-05: 4 [IU] via SUBCUTANEOUS
  Administered 2024-08-05: 6 [IU] via SUBCUTANEOUS
  Administered 2024-08-06 (×2): 3 [IU] via SUBCUTANEOUS
  Administered 2024-08-06: 4 [IU] via SUBCUTANEOUS
  Administered 2024-08-07: 1 [IU] via SUBCUTANEOUS
  Administered 2024-08-07: 5 [IU] via SUBCUTANEOUS
  Administered 2024-08-07: 3 [IU] via SUBCUTANEOUS
  Administered 2024-08-08: 2 [IU] via SUBCUTANEOUS
  Administered 2024-08-08: 3 [IU] via SUBCUTANEOUS
  Administered 2024-08-09: 2 [IU] via SUBCUTANEOUS

## 2024-07-29 MED ORDER — IOHEXOL 350 MG/ML SOLN
75.0000 mL | Freq: Once | INTRAVENOUS | Status: AC | PRN
  Administered 2024-07-29: 75 mL via INTRAVENOUS

## 2024-07-29 MED ORDER — ALBUTEROL SULFATE (2.5 MG/3ML) 0.083% IN NEBU
2.5000 mg | INHALATION_SOLUTION | Freq: Four times a day (QID) | RESPIRATORY_TRACT | Status: DC | PRN
  Administered 2024-07-30: 2.5 mg via RESPIRATORY_TRACT
  Filled 2024-07-29: qty 3

## 2024-07-29 MED ORDER — ALBUTEROL SULFATE HFA 108 (90 BASE) MCG/ACT IN AERS: 2.0000 | INHALATION_SPRAY | Freq: Four times a day (QID) | RESPIRATORY_TRACT | Status: DC | PRN

## 2024-07-29 MED ORDER — GABAPENTIN 300 MG PO CAPS
300.0000 mg | ORAL_CAPSULE | Freq: Every day | ORAL | Status: DC
  Administered 2024-07-29 – 2024-08-09 (×12): 300 mg via ORAL
  Filled 2024-07-29 (×12): qty 1

## 2024-07-29 MED ORDER — PANTOPRAZOLE SODIUM 40 MG PO TBEC
40.0000 mg | DELAYED_RELEASE_TABLET | Freq: Every day | ORAL | Status: DC
  Administered 2024-07-29 – 2024-08-09 (×12): 40 mg via ORAL
  Filled 2024-07-29 (×12): qty 1

## 2024-07-29 MED ORDER — CARVEDILOL 12.5 MG PO TABS: 12.5000 mg | ORAL_TABLET | Freq: Two times a day (BID) | ORAL | Status: DC

## 2024-07-29 MED ORDER — BUDESON-GLYCOPYRROL-FORMOTEROL 160-9-4.8 MCG/ACT IN AERO
2.0000 | INHALATION_SPRAY | Freq: Two times a day (BID) | RESPIRATORY_TRACT | Status: DC
  Administered 2024-07-29 – 2024-08-09 (×19): 2 via RESPIRATORY_TRACT
  Filled 2024-07-29: qty 5.9

## 2024-07-29 MED ORDER — SODIUM CHLORIDE 0.9 % IV SOLN
2.0000 g | INTRAVENOUS | Status: DC
  Administered 2024-07-29 – 2024-08-08 (×10): 2 g via INTRAVENOUS
  Filled 2024-07-29 (×11): qty 20

## 2024-07-29 MED ORDER — TAMSULOSIN HCL 0.4 MG PO CAPS
0.4000 mg | ORAL_CAPSULE | Freq: Every day | ORAL | Status: DC
  Administered 2024-07-30 – 2024-08-09 (×11): 0.4 mg via ORAL
  Filled 2024-07-29 (×11): qty 1

## 2024-07-29 MED ORDER — CARVEDILOL 12.5 MG PO TABS
12.5000 mg | ORAL_TABLET | Freq: Two times a day (BID) | ORAL | Status: DC
Start: 2024-07-29 — End: 2024-07-31
  Administered 2024-07-29 – 2024-07-30 (×3): 12.5 mg via ORAL
  Filled 2024-07-29 (×4): qty 1

## 2024-07-29 NOTE — Hospital Course (Addendum)
 [name] [name] is a 53 y.o.male with a history of T2DM, HTN, R BKA, PAD, and ESRD on HD TTS who was admitted to the [facility] Teaching Service at [facility] for R knee abscess. His hospital course is detailed below:  R Popliteal Fossa Abscess  Patient presented with concerns of R knee/popliteal fossa swelling and pain with history of recurrent infections and R BKA.  Labs demonstrate ESRD, otherwise normal with no leukocytosis and patient has remained afebrile throughout admission.  CT R knee revealed new soft tissue thickening within the medial aspect of the popliteal fossa with gas lucency suspicious for subcutaneous abscess.  Abscess was aspirated in the ED and sent for culture, which demonstrates many gram positive cocci and gram negative rods, but no growth on culture.  Broad spectrum vancomycin  and CTX initiated on 9/6, susceptibilities not determined from aspiration culture, course will be completed with Vanc and Ceftazidine upon discharge with HD on 9/20. Increased size and fluctuance of abscess on 9/10  and ortho consulted, who performed debridement on 9/12 without complication. They found an infected hematoma on excision. Ortho recommends continued pressure offloading and to not wear his prosthesis for about 2 weeks, will follow up outpatient. PT/OT recommended SNF placement in the interim.  Pleural Effusion CXR 9/11 shows increased bilateral pleural effusions and bibasilar airspace disease and cardiomegaly. Most likely associated with insufficient HD volume given reduction of symptoms after HD. Restarted home Lasix  40 mg BID along with regular HD sessions.  T2DM A1c 7.3 08/25, however with sustained elevation in CBGs, Lantus  was increased to *** at discharge.  Dialysis Catheter Issue Issue arose after dialysis on 9/6, on the arterial side of the catheter a plastic piece was stuck and came off the catheter after HD. IR and VVS evaluated the patient in the ED and catheter was replaced on 9/8  inpatient for inpatient HD sessions.   Other chronic conditions were medically managed with home medications and formulary alternatives as necessary (HTN, PAD, Anemia of Chronic Disease, GERD, HLD, Depression)  PCP Follow-up Recommendations: Follow up with orthopedics 9/19.

## 2024-07-29 NOTE — Assessment & Plan Note (Addendum)
 HD Tu/Th/Sat. Underwent HD today prior to coming to the ED with the mechanical complication of the HD catheter.  - Nephrology consulted, appreciate recommendations; plan to see 9/7 - AM RFP - Avoid nephrotoxic agents and practice caution with pain regimen

## 2024-07-29 NOTE — Assessment & Plan Note (Addendum)
 Patient s/p R BKA in 2018 and reports recurrent infections of this area. Patient reporting ongoing pain of affected area. Given his ESRD, will need to avoid nephrotoxic and poorly dialyzed agents for pain management. S/p abscess aspiration in the ED, fluid culture was positive for gram negative rods and gram positive cocci, will continue to follow. Will start broad antibiotic coverage, and narrow when susceptibilities are available. - Admit to FMTS, attending Dr. Donah - Vital signs per floor - Pain reg: Oxycodone  for moderate/severe pain q6h prn, could consider adding dilaudid  for breakthrough prn - Follow-up knee fluid culture susceptibilities - Abx: Continue vancomycin  and add ceftriaxone  - Carb-modified diet  - VTE prophylaxis: subQ heparin  - AM RFP - Fall precautions

## 2024-07-29 NOTE — ED Triage Notes (Addendum)
 Patient has circuit tip stuck in arterial line of dialysis port, patient had dialysis today and was sent here at the end for this. Patient also reports cysts/abscess to L knee in stump area, hx of same. Patient did receive entire dialysis treatment today.

## 2024-07-29 NOTE — Progress Notes (Signed)
 ED Pharmacy Antibiotic Sign Off An antibiotic consult was received from an ED provider for vancomycin  per pharmacy dosing for cellulitis. A chart review was completed to assess appropriateness.   The following one time order(s) were placed:  Vancomycin  2000mg  IV x1  Further antibiotic and/or antibiotic pharmacy consults should be ordered by the admitting provider if indicated.   Thank you for allowing pharmacy to be a part of this patient's care.   Koren LITTIE Or, [facility]  Clinical Pharmacist 07/29/24 3:21 PM

## 2024-07-29 NOTE — Discharge Instructions (Addendum)
 Thank you for letting us  care for you during your stay.  You were admitted to the [facility] Teaching [facility].   You were admitted for an infection in your leg.  The liquid we pulled from your leg showed many bacteria, but did not give specific bacteria types. We kept you on antibiotics that cover many different kinds of bacteria which we will continue till 9/19 with hemodialysis sessions.  We recommend follow up specifically for your leg infection with PCP and with orthopedics on 9/19.   If your symptoms worsen or return, please return to the hospital.  Please let us  know if you have questions about your stay at [facility].

## 2024-07-29 NOTE — Plan of Care (Signed)
 FMTS Interim Progress Note  S:  R knee abscess - Reports mild improvement in pain with medication  - Has been able to move it   Dyspnea  - Ongoing for 3 weeks, worsening over time  - Worse with laying down  - Productive cough - white  - Has been using home inhalers  - Last had HD this morning  O: BP (!) 188/93 (BP Location: Right Arm)   Pulse 87   Temp 98 F (36.7 C)   Resp 18   Ht 6' 5 (1.956 m)   Wt 108 kg   SpO2 99%   BMI 28.22 kg/m    General: No acute distress. Appears to be resting comfortably in room. CV: S1/S2. No extra heart sounds.  Pulm: No respiratory distress on RA. Bibasilar crackles present. No increased WOB. Skin/Ext:  Guaze dressing over R stump wound. Able to move R leg independently. R stump warm and dry overall.  Psych: Pleasant and appropriate.    A/P: R knee abscess Initial knee aspiration cx with abundant gram- rods and gram+ cocci. S/p R BKA with recurrent infections.  - Continue broad spectrum Vanc & CTX - de-escalate pending sensitivities  - FU aspiration cultures  - Oxycodone  2.5-5 q6 prn  - Tylenol    Dyspnea CXR with mild edema and small L pleural effusion. Patient received dialysis earlier today prior to admission. Nephrology consulted by day team.  - Cont home Lasix  40 BID - Appreciate Nephro recs - Cont home Breztri , Albuterol  prn   Continue care plan as otherwise stated in FMTS H&P. Morning labs updated.   Diona Perkins, MD 07/29/2024, 7:08 PM PGY-2, [facility] Family Medicine Service pager [phone]

## 2024-07-29 NOTE — Assessment & Plan Note (Signed)
 Hgb 7.8 on admission likely 2/2 anemia of chronic disease in the setting of ESRD.  - AM CBC - Nephro following, blood transfusions w/ HD prn - Transfusion threshold < 7

## 2024-07-29 NOTE — ED Provider Notes (Signed)
 [place] EMERGENCY DEPARTMENT AT [facility] Provider Note   CSN: [id number] Arrival date & time: 07/29/24  1258     Patient presents with: Abscess and Vascular Access Problem   [name] [name] is a 53 y.o. male.    Abscess  Patient presents with abscess of his right knee and with complication of his dialysis catheter  Has had pain and swelling behind his right knee.  Previous below the knee amputation.  Has had recurrent infections.  States he may have had some chills.  Has had more pain recently.  More swelling.  Also came in with complication of his dialysis catheter.  On the arterial side of the catheter has a plastic piece stuck at the end.  Came off the catheter after dialysis.    Prior to Admission medications   Medication Sig Start Date End Date Taking? Authorizing Provider  tamsulosin  (FLOMAX ) 0.4 MG CAPS capsule Take 1 capsule (0.4 mg total) by mouth daily. 07/27/24   Adele Song, MD  Accu-Chek Softclix Lancets lancets Use to check blood sugar twice per day. E11.9 03/15/24   Christia Budds, MD  albuterol  (VENTOLIN  HFA) 108 (90 Base) MCG/ACT inhaler Inhale 2 puffs into the lungs every 6 (six) hours as needed for wheezing or shortness of breath. 06/13/24   Hindel, Leah, MD  atorvastatin  (LIPITOR) 40 MG tablet Take 1 tablet (40 mg total) by mouth daily. 07/13/24   Adele Song, MD  Blood Glucose Monitoring Suppl (ACCU-CHEK GUIDE ME) w/Device KIT See admin instructions. 02/23/23   [provider]  calcitRIOL  (ROCALTROL ) 0.25 MCG capsule Take 1 capsule (0.25 mcg total) by mouth Every Tuesday,Thursday,and Saturday with dialysis. 10/26/23   Baloch, Mahnoor, MD  Calcium  Acetate 667 MG TABS Take 1,334 mg by mouth 3 (three) times daily. Patient not taking: Reported on 07/17/2024    [provider]  carvedilol  (COREG ) 12.5 MG tablet Take 1 tablet (12.5 mg total) by mouth 2 (two) times daily with a meal. 07/07/24   Hindel, Leah, MD  diltiazem  (CARDIZEM  CD)  300 MG 24 hr capsule Take 1 capsule (300 mg total) by mouth daily. 07/01/24   Adele Song, MD  FLUoxetine  (PROZAC ) 40 MG capsule Take 1 capsule (40 mg total) by mouth daily. 06/28/24   Lorrane Pac, MD  fluticasone  (FLONASE ) 50 MCG/ACT nasal spray Place 2 sprays into both nostrils daily. 05/11/24   Christia Budds, MD  Fluticasone -Umeclidin-Vilant (TRELEGY ELLIPTA ) 100-62.5-25 MCG/ACT AEPB INHALE 1 PUFF INTO LUNGS DAILY 05/15/24   Christia Budds, MD  gabapentin  (NEURONTIN ) 300 MG capsule TAKE 1 CAPSULE(300 MG) BY MOUTH DAILY 07/03/24   Hindel, Leah, MD  glucose blood (AGAMATRIX PRESTO TEST) test strip Check blood sugars twice daily before meals 02/02/18   Adella Norris, MD  glucose blood test strip 1 each by Other route as needed for other. Use as instructed    [provider]  hydrALAZINE  (APRESOLINE ) 100 MG tablet Take 2 tablets (200 mg total) by mouth 2 (two) times daily. 07/14/24   Lennie Raguel MATSU, DO  insulin  lispro (HUMALOG ) 100 UNIT/ML injection 0-6 Units, Subcutaneous, 3 times daily with meals, First dose on Sun 10/17/23 at 0800 Correction coverage: Very Sensitive (ESRD/Dialysis) CBG < 70: Implement Hypoglycemia Standing Orders and refer to Hypoglycemia Standing Orders sidebar report CBG 70 - 120: 0 units CBG 121 - 150: 0 units CBG 151 - 200: 1 unit CBG 201-250: 2 units CBG 251-300: 3 units CBG 301-350: 4 units CBG 351-400: 5 units CBG > 400: Give 6  units and call MD 10/26/23   Lonnie Earnest, MD  loratadine  (CLARITIN ) 10 MG tablet Take 1 tablet (10 mg total) by mouth daily. Patient not taking: Reported on 07/17/2024 05/24/24 05/24/25  Isadora Hose, MD  losartan  (COZAAR ) 25 MG tablet Take 1 tablet (25 mg total) by mouth daily. 07/27/24   Adele Song, MD  methocarbamol  (ROBAXIN ) 750 MG tablet Take 1 tablet (750 mg total) by mouth every 8 (eight) hours as needed for muscle spasms. 07/14/24   Baker, Raguel MATSU, DO  nicotine  (NICODERM CQ  - DOSED IN MG/24 HOURS) 14 mg/24hr patch Place  1 patch (14 mg total) onto the skin daily. Patient not taking: Reported on 07/17/2024 06/12/24   Barbarann Nest, MD  ondansetron  (ZOFRAN -ODT) 4 MG disintegrating tablet Take 1 tablet (4 mg total) by mouth every 8 (eight) hours as needed for nausea or vomiting. 12/12/23   Christia Budds, MD  pantoprazole  (PROTONIX ) 40 MG tablet TAKE 1 TABLET(40 MG) BY MOUTH DAILY 05/11/24   Christia Budds, MD    Allergies: Iron  and Tape    Review of Systems  Updated Vital Signs BP (!) 184/85 (BP Location: Right Arm)   Pulse 64   Temp 98.1 F (36.7 C)   Resp 17   Ht 6' 5 (1.956 m)   Wt 108 kg   SpO2 98%   BMI 28.22 kg/m   Physical Exam Vitals and nursing note reviewed.  Eyes:     Pupils: Pupils are equal, round, and reactive to light.  Cardiovascular:     Rate and Rhythm: Regular rhythm.  Pulmonary:     Comments:  right chest wall catheter with plastic at the end of the Abdominal:     Tenderness: There is no abdominal tenderness.  Musculoskeletal:     Comments: Swelling medially behind left knee.  Fluctuant with some erythema.  Also progressed medially behind the knee.  Approximately 5 cm across on the medial side.  Skin:    Capillary Refill: Capillary refill takes less than 2 seconds.  Neurological:     Mental Status: He is alert.     (all labs ordered are listed, but only abnormal results are displayed) Labs Reviewed  CBC WITH DIFFERENTIAL/PLATELET - Abnormal; Notable for the following components:      Result Value   RBC 3.59 (*)    Hemoglobin 7.8 (*)    HCT 27.3 (*)    MCV 76.0 (*)    MCH 21.7 (*)    MCHC 28.6 (*)    RDW 18.4 (*)    Platelets 111 (*)    All other components within normal limits  BODY FLUID CULTURE W GRAM STAIN  BASIC METABOLIC PANEL WITH GFR    EKG: None  Radiology: No results found.   Procedures   Medications Ordered in the ED  vancomycin  (VANCOREADY) IVPB 2000 mg/400 mL (has no administration in time range)                                     Medical Decision Making Amount and/or Complexity of Data Reviewed Labs: ordered. Radiology: ordered.     Patient with cyst and abscess behind right knee.  Drained with some fluid that was cloudy but still has firmness in the area.  Potential cellulitis or more complication on top of it.  Reportedly had some chills and is diabetic and on dialysis.  I think patient benefit likely from admission but will  get CT scan to evaluate.  Culture had been sent of the wound.  Does have complication of the dialysis catheter.  Seen in the ER by Dr. Pearline from ocular surgery.  Have been unable to get access out.  He could follow-up with the vascular access clinic with the dialysis center giving a call on Monday  Discussed with Dr. Jennefer and from that standpoint had ordered outpatient replacement of the catheter.  However will likely need admission for the knee infection.    Will give vancomycin .   Will discuss with family medicine for admission.     Final diagnoses:  Abscess of right lower extremity  Mechanical complication of dialysis catheter, initial encounter [facility])    ED Discharge Orders          Ordered    IR TUNNELED CENTRAL VENOUS CATH [facility]        07/29/24 1353               Patsey Lot, MD 07/29/24 1526

## 2024-07-29 NOTE — Assessment & Plan Note (Addendum)
 Concern for arterial side of dialysis catheter having plastic piece stuck at the end after patient had dialysis earlier today. Vascular surgery attempted to get access out of dialysis catheter, but was unsuccessful. IR consulted, and recommended following up outpatient for removal and replacement of catheter. - Patient advised to follow-up outpatient for removal and replacement of catheter.

## 2024-07-29 NOTE — H&P (Cosign Needed Addendum)
 Hospital Admission History and Physical Service Pager: [phone]  Patient name: [name] [name] Medical record number: [id number] Date of Birth: [birth year] Age: 53 y.o. Gender: male  Primary Care Provider: Rea Raring, MD Consultants: IR, Vascular Surgery Code Status: Full Code Preferred Emergency Contact:  Contact Information     Name Relation Home Work Mobile   [place] Sister [phone]  [phone]   [facility] Mother [phone]  [phone]   Chief Complaint: R knee pain/abscess  Differential and Medical Decision Making:  [name] [name] is a 53 y.o. male s/p R BKA in 2018 presenting with pain and swelling of the posterior right knee. Differential for this patient's presentation includes right knee abscess, Baker's cyst, and subcutaneous cellulitis. Right knee abscess is most likely given CT findings of soft tissue thickening within the popliteal fossa, fluctuance and localization of affected area, and general symptoms of chills. Cellulitis was also considered given pain, erythema, and swelling. Baker cyst is possible, but unlikely given fluctuance on physical exam and cloudy fluid aspirate. Assessment & Plan Abscess of right knee Patient s/p R BKA in 2018 and reports recurrent infections of this area. Patient reporting ongoing pain of affected area. Given his ESRD, will need to avoid nephrotoxic and poorly dialyzed agents for pain management. S/p abscess aspiration in the ED, fluid culture was positive for gram negative rods and gram positive cocci, will continue to follow. Will start broad antibiotic coverage, and narrow when susceptibilities are available. - Admit to FMTS, attending Dr. Donah - Vital signs per floor - Pain reg: Oxycodone  for moderate/severe pain q6h prn, could consider adding dilaudid  for breakthrough prn - Follow-up knee fluid culture susceptibilities - Abx: Continue vancomycin  and add ceftriaxone  - Carb-modified diet  - VTE  prophylaxis: subQ heparin  - AM RFP - Fall precautions Shortness of breath at rest Currently on RA and no tachypnea or tachycardia at rest., symptoms aren't exacerbated by activity, low suspicion for a PE. History of dyspnea at rest, orthopnea, and swelling in genital area. Denies chest pain. Symptomatic the day after hemodialysis, which suggests dyspnea may be related to increased fluid retention in the setting of ESRD. Substantial current smoking history of 1/2 PPD could also be contributing. - CXR - Continue to monitor - Continue home Trelegy and Albuterol  prn ESRD on hemodialysis (HCC) HD Tu/Th/Sat. Underwent HD today prior to coming to the ED with the mechanical complication of the HD catheter.  - Nephrology consulted, appreciate recommendations; plan to see 9/7 - AM RFP - Avoid nephrotoxic agents and practice caution with pain regimen Anemia of chronic disease Hgb 7.8 on admission likely 2/2 anemia of chronic disease in the setting of ESRD.  - AM CBC - Nephro following, blood transfusions w/ HD prn - Transfusion threshold < 7 Mechanical complication of dialysis catheter (HCC) Concern for arterial side of dialysis catheter having plastic piece stuck at the end after patient had dialysis earlier today. Vascular surgery attempted to get access out of dialysis catheter, but was unsuccessful. IR consulted, and recommended following up outpatient for removal and replacement of catheter. - Patient advised to follow-up outpatient for removal and replacement of catheter. Chronic health problem Type 2 diabetes mellitus - SSI, last A1c 1 month ago 7.3 ESRD on dialysis Tu/Th/Sat - Nephrology consulted, appreciate GERD - continue pantoprazole  HLD / PAD - continue lipitor HTN / SVT - continue hydralazine , diltiazem , carvedilol , and losartan  Depression - continue fluoxetine   FEN/GI: carb-modified diet VTE Prophylaxis: subcutaneous heparin   Disposition: Med-Surg  History of Present  Illness:   [name] [name] is a 53 y.o. male with past history of right BKA, type 2 diabetes mellitus on insulin , ESRD on dialysis, HLD, HTN, PAD presenting with pain and swelling of the right posterior knee.   Patient reports R knee pain that started 3 days ago. Denies discharge from the R knee BKA. He has a recurrent history of R knee infections, ~1-2x a month. Highest temp has been 99 F. Reports he felt the abscess behind his knee. He is having persistent pain at this time. States Tylenol  didn't help at all. Endorses some warmth with the abscess as well.   Endorses some shortness of breath for about 2 weeks as well that occurs primarily when he is laying in bed, but not as much when he is active. He usually sleeps on his side. He has to use at least 2 pillows at night to sleep. Denies any chest pain. Endorses some swelling in his genital area. Denies any leg swelling. Feels like his symptoms are worse the day after hemodialysis.   Reports mild congestion, cough, and mild right sided abdominal pain. Denies chest pain, N/V/D, or L leg pain.   In the ED, patient remained afebrile and hypertensive.  Labs demonstrated elevated glucose, elevated creatinine to 4.8, and GFR of 16 (history of ESRD).  No leukocytosis and hemoglobin 7.8, likely secondary to anemia of chronic disease.  CT R knee imaging study revealed new focal region of soft tissue thickening within the medial aspect of the popliteal fossa w/ underlying gas lucency suspicious for subcutaneous abscess.  Right knee posterior abscess aspiration was performed by EDP and culture pending. Cloudy fluid was drained from behind the knee area. Patient was given Tylenol  and Vancomycin  1000 mg IV x 1.  Review Of Systems: Per HPI with the following additions: None  Pertinent Past Medical History: Type 2 diabetes mellitus ESRD on dialysis GERD HLD HTN PAD SVT Necrotizing fascitis Depression Remainder reviewed in history tab.   Pertinent Past Surgical  History: Right BKA Left toe amputation Remainder reviewed in history tab   Pertinent Social History: Tobacco use: Yes, 1/2 PPD Alcohol use: No Other Substance use: No Lives with mom and sister  Pertinent Family History: Mother - T2DM, HTN, HLD Father - heart disease, T2DM, HLD, HTN Maternal grandmother - heart disease Maternal grandfather - heart disease   Important Outpatient Medications: Albuterol  108 (90 base) 2 puffs q6h prn Lipitor 40 mg PO daily Carvedilol  12.5 mg PO daily Calcitriol  0.25 mcg every Tu/Th/Sat Cardizem  300 mg po daily Fluoxetine  40 mg po daily Fluticasone -Umeclidin-Vilant 100-62.5-25 mcg 1 puff daily Gabapentin  300 mg po daily Hydralazine  100 mg po BID Insulin  lispro 0-6 units TID with meals Losartan  25 mg po daily Methocarbamol  750 mg po q8h prn Ondansetron  4 mg po q8h prn Pantoprazole  40 mg po daily Tamsulosin  0.4 mg po daily   Objective: BP (!) 184/85 (BP Location: Right Arm)   Pulse 64   Temp 98.1 F (36.7 C)   Resp 17   Ht 6' 5 (1.956 m)   Wt 108 kg   SpO2 98%   BMI 28.22 kg/m   Exam: General: well-appearing male, lying in bed, NAD Eyes: EOM intact, no scleral icterus ENTM: MMM Neck: supple Cardiovascular: Normal rate and rhythm; No M/R/G Respiratory: CTAB, Bibasilar crackles. Normal WOB on RA.  Gastrointestinal: Non-distended; soft, non-tender MSK: ROM grossly intact; R BKA. R abscess palpated, erythematous and warm noted, covered with gauze. 1-2+ LLE edema Derm: skin warm and dry.  Ichthyosis vulgaris.  Neuro: alert and oriented Psych: pleasant, appropriate affect  Labs:  CBC BMET  Recent Labs  Lab 07/29/24 1429  WBC 8.6  HGB 7.8*  HCT 27.3*  PLT 111*   Recent Labs  Lab 07/29/24 1429  NA 139  K 4.0  CL 102  CO2 22  BUN 14  CREATININE 4.28*  GLUCOSE 249*  CALCIUM  8.5*     Pertinent additional labs: GFR 16, MCV 76  EKG: None  Imaging Studies Performed:  CT Knee Right w/ Contrast Impression from  Radiologist:  1. New focal region of soft tissue thickening within the medial  aspect of the popliteal fossa measuring 2.0 x 4.3 x 5.5 cm, with  underlying gas lucency suspicious for subcutaneous abscess.  2. Slight improvement in the focal subcutaneous thickening within  the lateral aspect of the right popliteal fossa seen previously,  with no evidence of underlying fluid collection or abscess in this  region.  3. Diffuse osteopenia. No acute or destructive bony process. No  evidence of osteomyelitis.    Mannie Ashley SAILOR, MD 07/29/2024, 4:58 PM PGY-1, [facility]  FPTS Intern pager: [phone], text pages welcome Secure chat group [facility] Teaching Service   I have discussed the above with Dr. Mannie and agree with the documented plan. My edits for correction/addition/clarification are included above. Please see any attending notes.   Kathrine Melena, DO PGY-2, [facility] 07/29/2024 8:23 PM

## 2024-07-29 NOTE — Assessment & Plan Note (Addendum)
 Type 2 diabetes mellitus - SSI, last A1c 1 month ago 7.3 ESRD on dialysis Tu/Th/Sat - Nephrology consulted, appreciate GERD - continue pantoprazole  HLD / PAD - continue lipitor HTN / SVT - continue hydralazine , diltiazem , carvedilol , and losartan  Depression - continue fluoxetine 

## 2024-07-29 NOTE — ED Provider Notes (Signed)
 Case discussed with Family medicine service regarding admission   Randol Simmonds, MD 07/29/24 [phone]

## 2024-07-29 NOTE — Assessment & Plan Note (Addendum)
 Currently on RA and no tachypnea or tachycardia at rest., symptoms aren't exacerbated by activity, low suspicion for a PE. History of dyspnea at rest, orthopnea, and swelling in genital area. Denies chest pain. Symptomatic the day after hemodialysis, which suggests dyspnea may be related to increased fluid retention in the setting of ESRD. Substantial current smoking history of 1/2 PPD could also be contributing. - CXR - Continue to monitor - Continue home Trelegy and Albuterol  prn

## 2024-07-30 DIAGNOSIS — L02415 Cutaneous abscess of right lower limb: Secondary | ICD-10-CM

## 2024-07-30 LAB — CBC
HCT: 27.3 % — ABNORMAL LOW (ref 39.0–52.0)
Hemoglobin: 7.8 g/dL — ABNORMAL LOW (ref 13.0–17.0)
MCH: 21.7 pg — ABNORMAL LOW (ref 26.0–34.0)
MCHC: 28.6 g/dL — ABNORMAL LOW (ref 30.0–36.0)
MCV: 75.8 fL — ABNORMAL LOW (ref 80.0–100.0)
Platelets: 118 K/uL — ABNORMAL LOW (ref 150–400)
RBC: 3.6 MIL/uL — ABNORMAL LOW (ref 4.22–5.81)
RDW: 18.4 % — ABNORMAL HIGH (ref 11.5–15.5)
WBC: 6.6 K/uL (ref 4.0–10.5)
nRBC: 0 % (ref 0.0–0.2)

## 2024-07-30 LAB — RENAL FUNCTION PANEL
Albumin: 2.6 g/dL — ABNORMAL LOW (ref 3.5–5.0)
Anion gap: 11 (ref 5–15)
BUN: 27 mg/dL — ABNORMAL HIGH (ref 6–20)
CO2: 24 mmol/L (ref 22–32)
Calcium: 8 mg/dL — ABNORMAL LOW (ref 8.9–10.3)
Chloride: 99 mmol/L (ref 98–111)
Creatinine, Ser: 5.73 mg/dL — ABNORMAL HIGH (ref 0.61–1.24)
GFR, Estimated: 11 mL/min — ABNORMAL LOW (ref >60)
Glucose, Bld: 239 mg/dL — ABNORMAL HIGH (ref 70–99)
Phosphorus: 3.3 mg/dL (ref 2.5–4.6)
Potassium: 4.1 mmol/L (ref 3.5–5.1)
Sodium: 134 mmol/L — ABNORMAL LOW (ref 135–145)

## 2024-07-30 LAB — GLUCOSE, CAPILLARY
Glucose-Capillary: 229 mg/dL — ABNORMAL HIGH (ref 70–99)
Glucose-Capillary: 240 mg/dL — ABNORMAL HIGH (ref 70–99)
Glucose-Capillary: 318 mg/dL — ABNORMAL HIGH (ref 70–99)
Glucose-Capillary: 246 mg/dL — ABNORMAL HIGH (ref 70–99)
Glucose-Capillary: 304 mg/dL — ABNORMAL HIGH (ref 70–99)

## 2024-07-30 MED ORDER — SODIUM CHLORIDE 0.9% FLUSH
10.0000 mL | Freq: Two times a day (BID) | INTRAVENOUS | Status: DC
  Administered 2024-07-30 – 2024-08-09 (×19): 10 mL

## 2024-07-30 MED ORDER — INSULIN ASPART 100 UNIT/ML IJ SOLN
3.0000 [IU] | Freq: Once | INTRAMUSCULAR | Status: AC
  Administered 2024-07-30: 3 [IU] via SUBCUTANEOUS

## 2024-07-30 MED ORDER — VANCOMYCIN VARIABLE DOSE PER UNSTABLE RENAL FUNCTION (PHARMACIST DOSING): Status: DC

## 2024-07-30 MED ORDER — CHLORHEXIDINE GLUCONATE CLOTH 2 % EX PADS
6.0000 | MEDICATED_PAD | Freq: Every day | CUTANEOUS | Status: DC
  Administered 2024-07-30 – 2024-07-31 (×2): 6 via TOPICAL

## 2024-07-30 MED ORDER — PANTOPRAZOLE SODIUM 40 MG PO TBEC
40.0000 mg | DELAYED_RELEASE_TABLET | Freq: Once | ORAL | Status: AC
  Administered 2024-07-30: 40 mg via ORAL
  Filled 2024-07-30: qty 1

## 2024-07-30 MED ORDER — FAMOTIDINE 20 MG PO TABS
10.0000 mg | ORAL_TABLET | Freq: Once | ORAL | Status: AC
  Administered 2024-07-30: 10 mg via ORAL
  Filled 2024-07-30: qty 1

## 2024-07-30 MED ORDER — CEFAZOLIN SODIUM-DEXTROSE 2-4 GM/100ML-% IV SOLN: 2.0000 g | INTRAVENOUS | Status: DC

## 2024-07-30 MED ORDER — SODIUM CHLORIDE 0.9% FLUSH: 10.0000 mL | INTRAVENOUS | Status: DC | PRN

## 2024-07-30 MED ORDER — GUAIFENESIN ER 600 MG PO TB12
600.0000 mg | ORAL_TABLET | Freq: Two times a day (BID) | ORAL | Status: DC | PRN
  Administered 2024-07-30 – 2024-07-31 (×2): 600 mg via ORAL
  Filled 2024-07-30 (×2): qty 1

## 2024-07-30 MED ORDER — ONDANSETRON 4 MG PO TBDP
4.0000 mg | ORAL_TABLET | Freq: Once | ORAL | Status: AC
  Administered 2024-07-30: 4 mg via ORAL
  Filled 2024-07-30: qty 1

## 2024-07-30 NOTE — Assessment & Plan Note (Addendum)
 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 CT knee 9/6 with soft tissue thickening suspicious for subQ abscess. Remains afebrile, pain well-controlled at this time. Knee aspiration drawn by EDP.  - Cont Vanc, CTX daily  - Pending knee aspiration culture - narrow abx as indicated  - Pain regimen: Oxycodone  2.5-5 mg q6prn, Tylenol  prn  - Fall precautions

## 2024-07-30 NOTE — Consult Note (Signed)
 Renal Service Consult Note [facility] Lamar JONETTA Fret, MD  Patient: [name] [name] Date: 07/30/2024 Requesting Physician: Dr. Donah  Reason for Consult: ESRD patient with abscess of the right knee HPI: The patient is a 53 y.o. year-old w/ PMH as below who presented to ED yesterday afternoon after dialysis.  He reported cysts and/or abscesses in his knee area near the stump with a history of the same.  He was also having chills at home, pain in that area and swelling.  History of right BKA in 2018, history of prior infections in this area.  In the ED the abscess was aspirated and the Gram stain was positive for gram-negative rods and GPC.  He was started on broad-spectrum antibiotics.  He received a full dialysis treatment at OP HD unit on Saturday.  We are asked to see for dialysis.   Pt seen in room.  Patient has been reporting shortness of breath at rest.  Chest x-ray showed possibly early pulm edema. Pt denies SOB to me, tonight. He is not sure but thinks that somebody is going to be changing out his current [facility] for a new [facility] tomorrow. He said radiology. I don't see any order for IR in the computer.    ROS - denies CP, no joint pain, no HA, no blurry vision, no rash, no diarrhea, no nausea/ vomiting   Past Medical History  Past Medical History:  Diagnosis Date   Abdominal pain 11/30/2023   Abdominal tenderness 10/14/2023   Allergy    seasonal and environmental   Anemia    hx blood transfusion 04/27/24   Chest pain 12/01/2023   Chronic constipation 05/13/2020   Depression    Diabetes mellitus without complication (HCC) 2019   Dyspnea    hx - SOB with exertion   ESRD on hemodialysis (HCC)    Tues, Thurs, Sat   GERD (gastroesophageal reflux disease)    Hyperlipidemia    Hypertension    Hypertensive urgency 01/24/2024   Hypotension 12/01/2023   Necrotizing fasciitis (HCC) 10/13/2017   Neuromuscular disorder (HCC)    neuropathy   PAD (peripheral artery  disease) (HCC)    Patellar tendinitis 10/12/2023   Secondary hyperparathyroidism (HCC)    SVT (supraventricular tachycardia) (HCC)    hx   Wound dehiscence 11/24/2017   Past Surgical History  Past Surgical History:  Procedure Laterality Date   A/V FISTULAGRAM N/A 03/17/2024   Procedure: A/V Fistulagram;  Surgeon: Magda Debby SAILOR, MD;  Location: [facility] INVASIVE CV LAB;  Service: Cardiovascular;  Laterality: N/A;   AMPUTATION Right 10/13/2017   Procedure: RIGHT BELOW KNEE AMPUTATION;  Surgeon: Harden Jerona GAILS, MD;  Location: [facility] OR;  Service: Orthopedics;  Laterality: Right;   AMPUTATION Right 11/24/2017   Procedure: AMPUTATION BELOW KNEE REVISION;  Surgeon: Harden Jerona GAILS, MD;  Location: [facility] OR;  Service: Orthopedics;  Laterality: Right;   AMPUTATION TOE Left    AV FISTULA PLACEMENT Left 10/19/2023   Procedure: LEFT BRACHIOCEPHALIC ARTERIOVENOUS (AV) FISTULA CREATION;  Surgeon: Pearline Norman RAMAN, MD;  Location: [facility] OR;  Service: Vascular;  Laterality: Left;   AV FISTULA PLACEMENT Left 01/26/2024   Procedure: LEFT ARTERIOVENOUS (AV) FISTULA CREATION;  Surgeon: Pearline Norman RAMAN, MD;  Location: [facility] OR;  Service: Vascular;  Laterality: Left;   BASCILIC VEIN TRANSPOSITION Left 04/26/2024   Procedure: LEFT SECOND STAGE TRANSPOSITION, VEIN, BASILIC;  Surgeon: Magda Debby SAILOR, MD;  Location: [facility] OR;  Service: Vascular;  Laterality: Left;   BIOPSY  09/02/2021  Procedure: BIOPSY;  Surgeon: Federico Rosario BROCKS, MD;  Location: [facility] ENDOSCOPY;  Service: Gastroenterology;;   BIOPSY  02/18/2023   Procedure: BIOPSY;  Surgeon: Leigh Elspeth SQUIBB, MD;  Location: [facility] ENDOSCOPY;  Service: Gastroenterology;;   COLONOSCOPY  05/11/2019   COLONOSCOPY  2021   3 polyps - tubular adenomas   ESOPHAGOGASTRODUODENOSCOPY (EGD) WITH PROPOFOL  N/A 09/02/2021   Procedure: ESOPHAGOGASTRODUODENOSCOPY (EGD) WITH PROPOFOL ;  Surgeon: Federico Rosario BROCKS, MD;  Location: [facility] ENDOSCOPY;  Service: Gastroenterology;  Laterality: N/A;    ESOPHAGOGASTRODUODENOSCOPY (EGD) WITH PROPOFOL  N/A 02/18/2023   Procedure: ESOPHAGOGASTRODUODENOSCOPY (EGD) WITH PROPOFOL ;  Surgeon: Leigh Elspeth SQUIBB, MD;  Location: [facility] ENDOSCOPY;  Service: Gastroenterology;  Laterality: N/A;   INCISION AND DRAINAGE OF WOUND Left 05/17/2024   Procedure: IRRIGATION AND DEBRIDEMENT WOUND;  Surgeon: Serene Gaile ORN, MD;  Location: [facility] OR;  Service: Vascular;  Laterality: Left;  Arm   INTRAMEDULLARY (IM) NAIL INTERTROCHANTERIC Right 10/15/2023   Procedure: INTRAMEDULLARY (IM) NAIL INTERTROCHANTERIC;  Surgeon: Kendal Franky SQUIBB, MD;  Location: [facility] OR;  Service: Orthopedics;  Laterality: Right;   IR FLUORO GUIDE CV LINE RIGHT  10/15/2023   IR US  GUIDE VASC ACCESS RIGHT  10/15/2023   POLYPECTOMY     WISDOM TOOTH EXTRACTION     Family History  Family History  Problem Relation Age of Onset   Diabetes Mellitus II Mother    Depression Mother    Colon polyps Mother    Hypertension Mother    High Cholesterol Mother    Heart disease Father        CABG x 4.  04/2017   Diabetes Father    High Cholesterol Father    Hypertension Father    Heart disease Maternal Grandmother    Heart disease Maternal Grandfather    Colon cancer Neg Hx    Esophageal cancer Neg Hx    Liver cancer Neg Hx    Pancreatic cancer Neg Hx    Rectal cancer Neg Hx    Stomach cancer Neg Hx    Social History  reports that he has been smoking cigarettes. He started smoking about 35 years ago. He has a 18 pack-year smoking history. He has never used smokeless tobacco. He reports that he does not drink alcohol and does not use drugs. Allergies  Allergies  Allergen Reactions   Iron  Nausea And Vomiting    With the iron  tablets   Tape Rash    Rash at site of tape--okay to use paper tape   Home medications Prior to Admission medications   Medication Sig Start Date End Date Taking? Authorizing Provider  albuterol  (VENTOLIN  HFA) 108 (90 Base) MCG/ACT inhaler Inhale 2 puffs into the lungs every 6  (six) hours as needed for wheezing or shortness of breath. 06/13/24  Yes Hindel, Leah, MD  atorvastatin  (LIPITOR) 40 MG tablet Take 1 tablet (40 mg total) by mouth daily. 07/13/24  Yes Hindel, Rea, MD  calcitRIOL  (ROCALTROL ) 0.25 MCG capsule Take 1 capsule (0.25 mcg total) by mouth Every Tuesday,Thursday,and Saturday with dialysis. 10/26/23  Yes Baloch, Mahnoor, MD  Calcium  Acetate 667 MG TABS Take 1,334 mg by mouth 3 (three) times daily. Patient taking differently: Take 1,334 mg by mouth 2 (two) times daily.   Yes [provider]  carvedilol  (COREG ) 12.5 MG tablet Take 1 tablet (12.5 mg total) by mouth 2 (two) times daily with a meal. 07/07/24  Yes Hindel, Leah, MD  diltiazem  (CARDIZEM  CD) 300 MG 24 hr capsule Take 1 capsule (300 mg total) by  mouth daily. 07/01/24  Yes Hindel, Leah, MD  FLUoxetine  (PROZAC ) 40 MG capsule Take 1 capsule (40 mg total) by mouth daily. 06/28/24  Yes Nygaard, Joseph, MD  Fluticasone -Umeclidin-Vilant (TRELEGY ELLIPTA ) 100-62.5-25 MCG/ACT AEPB INHALE 1 PUFF INTO LUNGS DAILY 05/15/24  Yes Christia Budds, MD  furosemide  (LASIX ) 40 MG tablet Take 40 mg by mouth 2 (two) times daily.   Yes [provider]  gabapentin  (NEURONTIN ) 300 MG capsule TAKE 1 CAPSULE(300 MG) BY MOUTH DAILY 07/03/24  Yes Hindel, Leah, MD  hydrALAZINE  (APRESOLINE ) 100 MG tablet Take 2 tablets (200 mg total) by mouth 2 (two) times daily. 07/14/24  Yes Baker, Raguel MATSU, DO  insulin  lispro (HUMALOG ) 100 UNIT/ML injection 0-6 Units, Subcutaneous, 3 times daily with meals, First dose on Sun 10/17/23 at 0800 Correction coverage: Very Sensitive (ESRD/Dialysis) CBG < 70: Implement Hypoglycemia Standing Orders and refer to Hypoglycemia Standing Orders sidebar report CBG 70 - 120: 0 units CBG 121 - 150: 0 units CBG 151 - 200: 1 unit CBG 201-250: 2 units CBG 251-300: 3 units CBG 301-350: 4 units CBG 351-400: 5 units CBG > 400: Give 6 units and call MD 10/26/23  Yes Baloch, Mahnoor, MD  loratadine   (CLARITIN ) 10 MG tablet Take 1 tablet (10 mg total) by mouth daily. 05/24/24 05/24/25 Yes Dgayli, Belva, MD  losartan  (COZAAR ) 25 MG tablet Take 1 tablet (25 mg total) by mouth daily. 07/27/24  Yes Hindel, Leah, MD  methocarbamol  (ROBAXIN ) 750 MG tablet Take 1 tablet (750 mg total) by mouth every 8 (eight) hours as needed for muscle spasms. 07/14/24  Yes Baker, Amelia G, DO  ondansetron  (ZOFRAN -ODT) 4 MG disintegrating tablet Take 1 tablet (4 mg total) by mouth every 8 (eight) hours as needed for nausea or vomiting. 12/12/23  Yes Christia Budds, MD  pantoprazole  (PROTONIX ) 40 MG tablet TAKE 1 TABLET(40 MG) BY MOUTH DAILY 05/11/24  Yes Christia Budds, MD  tamsulosin  (FLOMAX ) 0.4 MG CAPS capsule Take 1 capsule (0.4 mg total) by mouth daily. 07/27/24  Yes Hindel, Leah, MD  Accu-Chek Softclix Lancets lancets Use to check blood sugar twice per day. E11.9 03/15/24   Christia Budds, MD  Blood Glucose Monitoring Suppl (ACCU-CHEK GUIDE ME) w/Device KIT See admin instructions. 02/23/23   [provider]  fluticasone  (FLONASE ) 50 MCG/ACT nasal spray Place 2 sprays into both nostrils daily. Patient taking differently: Place 2 sprays into both nostrils daily as needed for allergies. 05/11/24   Christia Budds, MD  glucose blood (AGAMATRIX PRESTO TEST) test strip Check blood sugars twice daily before meals 02/02/18   Adella Norris, MD  glucose blood test strip 1 each by Other route as needed for other. Use as instructed    [provider]  nicotine  (NICODERM CQ  - DOSED IN MG/24 HOURS) 14 mg/24hr patch Place 1 patch (14 mg total) onto the skin daily. Patient not taking: No sig reported 06/12/24   Barbarann Nest, MD     Vitals:   07/30/24 9187 07/30/24 0818 07/30/24 1223 07/30/24 1649  BP:  (!) 170/84 (!) 149/75 139/68  Pulse: 80 73 63 (!) 58  Resp: 18 17 18 20   Temp:  97.6 F (36.4 C) 98.1 F (36.7 C) (!) 97.5 F (36.4 C)  TempSrc:      SpO2:  96% 97% 95%  Weight:      Height:        Exam Gen alert, no distress Sclera anicteric, throat clear  +JVD Chest clear bilat to bases RRR no MRG Abd soft  ntnd no mass or ascites +bs Ext trace LE edema, no other edema Neuro is alert, Ox 3 , nf    RIJ TDC , one of the limbs looks cracked  Home bp meds: Coreg  12.5 bid Cardizem  CD 300mg  every day Lasix  40mg  bid Hydralazine  200mg  bid Losartan  25 qd   OP HD: [facility] TTS 4h  B400   100.4kg   2K bath  TDC   Heparin  2000 Last OP HD 9/6, post wt 101.6kg Was coming off at 105- 108kg 3-4 wks ago But this past 2 wks has been getting close to dry wt Mircera 225 q 2 wks, last 9/06    Assessment/ Plan: Knee abscess: sp drainage, getting IV abx Vanc / Rocephin  ESRD: on HD TTS. Next HD Tuesday.  HD access: one of the TDC limbs is cracked, will need catheter exchange. Primary team has d/w w/ IR who will try to get him done Monday; if not, then Tuesday am.  HTN: getting his home BP meds + lasix  here Volume: CXR +early IS pulm edema, not real symptomatic on exam. Max UF w/ next HD.  Anemia of esrd: Hb 7- 8 here, just rec'd high-dose esa at OP unit yesterday. Follow, transfuse prn.     Myer Fret  MD [facility] 07/30/2024, 6:04 PM  Recent Labs  Lab 07/29/24 1429 07/30/24 0448  HGB 7.8* 7.8*  ALBUMIN   --  2.6*  CALCIUM  8.5* 8.0*  PHOS  --  3.3  CREATININE 4.28* 5.73*  K 4.0 4.1   Inpatient medications:  atorvastatin   40 mg Oral Daily   budesonide -glycopyrrolate -formoterol   2 puff Inhalation BID   carvedilol   12.5 mg Oral BID WC   Chlorhexidine  Gluconate Cloth  6 each Topical Daily   diltiazem   300 mg Oral Daily   FLUoxetine   40 mg Oral Daily   furosemide   40 mg Oral BID   gabapentin   300 mg Oral Daily   heparin   5,000 Units Subcutaneous Q8H   hydrALAZINE   200 mg Oral BID   insulin  aspart  0-6 Units Subcutaneous TID WC   losartan   25 mg Oral Daily   pantoprazole   40 mg Oral Daily   sodium chloride  flush  10-40 mL Intracatheter Q12H   tamsulosin   0.4 mg Oral Daily    vancomycin  variable dose per unstable renal function (pharmacist dosing)   Does not apply See admin instructions    [START ON 07/31/2024]  ceFAZolin  (ANCEF ) IV     cefTRIAXone  (ROCEPHIN )  IV 2 g (07/29/24 2130)   acetaminophen  **OR** acetaminophen , albuterol , guaiFENesin , oxyCODONE  **OR** oxyCODONE , sodium chloride  flush

## 2024-07-30 NOTE — Assessment & Plan Note (Addendum)
 Cr increased to 5.7 this morning, baseline appears around 4-5. Home HD schedule TTS. Last session 9/6 morning. Nephrology consulted.  - Appreciate Nephrology recs - AM RFP - Regarding complication of dialysis catheter, VSS unsuccessful with accessing from catheter,  IR consulted recommend follow up outpatient for replacement.

## 2024-07-30 NOTE — Assessment & Plan Note (Addendum)
 Type 2 diabetes mellitus - SSI, last A1c 1 month ago 7.3 GERD - continue home pantoprazole  HLD / PAD - continue home lipitor HTN / SVT - continue home hydralazine , diltiazem , carvedilol , and losartan  Depression - continue home fluoxetine 

## 2024-07-30 NOTE — Progress Notes (Signed)
 Daily Progress Note Intern Pager: [phone]  Patient name: [name] [name] Medical record number: [id number] Date of birth: 16-Feb-1971 Age: 53 y.o. Gender: male  Primary Care Provider: Adele Song, MD Consultants: IR, VSS, Nephrology  Code Status: Full code  Pt Overview and Major Events to Date:  07/29/24: Admitted to FMTS  Medical Decision Making:  Warren LITTIE Marc 53 y.o. male with PMH ESRD on HD, T2DM, HTN, R BKA admitted for R knee abscess. Receiving broad IV abx, awaiting knee aspiration culture. Nephrology consulted for HD.   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 CT knee 9/6 with soft tissue thickening suspicious for subQ abscess. Remains afebrile, pain well-controlled at this time. Knee aspiration drawn by EDP.  - Cont Vanc, CTX daily  - Pending knee aspiration culture - narrow abx as indicated  - Pain regimen: Oxycodone  2.5-5 mg q6prn, Tylenol  prn  - Fall precautions  Shortness of breath at rest Oxygenation and RR stable on room air with continued dyspneic sensation. CXR with signs of mild fluid.  - Cont home lasix  40 BID - HD per Nephro  - Home trelegy, albuterol  prn  ESRD on hemodialysis (HCC) Mechanical complication of dialysis catheter (HCC) Cr increased to 5.7 this morning, baseline appears around 4-5. Home HD schedule TTS. Last session 9/6 morning. Nephrology consulted.  - Appreciate Nephrology recs - AM RFP - Regarding complication of dialysis catheter, VSS unsuccessful with accessing from catheter,  IR consulted recommend follow up outpatient for replacement.  Anemia of chronic disease Hgb stable 7.8 > 7.8 this morning. Suspect secondary to chronic disease. Per chart review, unclear documentation of CAD, appears unlikely given Cardiology office notes and no cath reports.  - AM CBC - Transfusion threshold <7 Chronic health problem Type 2 diabetes mellitus - SSI, last A1c 1 month ago 7.3 GERD -  continue home pantoprazole  HLD / PAD - continue home lipitor HTN / SVT - continue home hydralazine , diltiazem , carvedilol , and losartan  Depression - continue home fluoxetine    FEN/GI: Carb modified  PPx: Heparin   Dispo: Home pending clinical improvement   Subjective:  Reports feeling alright this morning. Leg pain okay. Chest burning sensation resolved. Shortness of breath remains.   Objective: Temp:  [97.6 F (36.4 C)-98.5 F (36.9 C)] 98.5 F (36.9 C) (09/07 0441) Pulse Rate:  [64-88] 78 (09/07 0441) Resp:  [16-20] 20 (09/07 0441) BP: (166-191)/(80-93) 172/86 (09/07 0441) SpO2:  [95 %-99 %] 95 % (09/07 0507) Weight:  [891 kg] 108 kg (09/06 1312) Physical Exam: General: No acute distress. Resting comfortably in room. CV: Normal S1/S2. No extra heart sounds. Warm and well-perfused. Pulm: Breathing comfortably on room air. Diminished breath sounds at bases with bibasilar crackles. No increased WOB. Abd: Soft, non-tender, non-distended. Skin:  Warm, dry. R leg stump warm and dry overall. R stump wound with gauze dressing, surrounding erythema without induration.  Psych: Pleasant and appropriate.    Laboratory: Most recent CBC Lab Results  Component Value Date   WBC 6.6 07/30/2024   HGB 7.8 (L) 07/30/2024   HCT 27.3 (L) 07/30/2024   MCV 75.8 (L) 07/30/2024   PLT 118 (L) 07/30/2024   Most recent BMP    Latest Ref Rng & Units 07/30/2024    4:48 AM  BMP  Glucose 70 - 99 mg/dL 760   BUN 6 - 20 mg/dL 27   Creatinine 9.38 - 1.24 mg/dL 4.26   Sodium 864 - 854 mmol/L  134   Potassium 3.5 - 5.1 mmol/L 4.1   Chloride 98 - 111 mmol/L 99   CO2 22 - 32 mmol/L 24   Calcium  8.9 - 10.3 mg/dL 8.0    PGY-2, [facility] FPTS Intern pager: [phone], text pages welcome Secure chat group [facility] Teaching Service

## 2024-07-30 NOTE — Plan of Care (Signed)
 Notified by nursing of patient reporting 3/10 middle chest pain, burning feeling, no radiation.   Went to see patient at bedside. Patient sleeping, easily awoken to voice. Patient points to lower sternal, upper epigastric region and describes burning sensation, no radiation. Patient reports feeling fine otherwise. On exam, patient resting comfortably, no respiratory distress on RA, normal S1/S2, tender to palpation of lower sternum/upper epigastric region. Low concern for ACS given overall presentation and stable vitals. Overall suspect possible acid reflux contributing to symptoms. Patient already received Protonix  40 earlier. Will add one-time dose of Famotidine  10mg .

## 2024-07-30 NOTE — Progress Notes (Signed)
 At bedside for HD assessment. Occlusive dressing clean, dry and intact. Attempted to change with transparent dressing, however, pt stated,  The PA said it would likely be exchanged tomorrow, so I will wait.

## 2024-07-30 NOTE — Assessment & Plan Note (Addendum)
 Oxygenation and RR stable on room air with continued dyspneic sensation. CXR with signs of mild fluid.  - Cont home lasix  40 BID - HD per Nephro  - Home trelegy, albuterol  prn

## 2024-07-30 NOTE — Assessment & Plan Note (Addendum)
 Hgb stable 7.8 > 7.8 this morning. Suspect secondary to chronic disease. Per chart review, unclear documentation of CAD, appears unlikely given Cardiology office notes and no cath reports.  - AM CBC - Transfusion threshold <7

## 2024-07-31 ENCOUNTER — Inpatient Hospital Stay (HOSPITAL_COMMUNITY)

## 2024-07-31 DIAGNOSIS — L02415 Cutaneous abscess of right lower limb: Secondary | ICD-10-CM | POA: Diagnosis not present

## 2024-07-31 DIAGNOSIS — R001 Bradycardia, unspecified: Secondary | ICD-10-CM

## 2024-07-31 HISTORY — PX: IR TUNNELED CENTRAL VENOUS CATH PLC W IMG: IMG1939

## 2024-07-31 LAB — GLUCOSE, CAPILLARY
Glucose-Capillary: 223 mg/dL — ABNORMAL HIGH (ref 70–99)
Glucose-Capillary: 289 mg/dL — ABNORMAL HIGH (ref 70–99)
Glucose-Capillary: 341 mg/dL — ABNORMAL HIGH (ref 70–99)
Glucose-Capillary: 270 mg/dL — ABNORMAL HIGH (ref 70–99)

## 2024-07-31 LAB — RENAL FUNCTION PANEL
Albumin: 2.6 g/dL — ABNORMAL LOW (ref 3.5–5.0)
Anion gap: 15 (ref 5–15)
BUN: 45 mg/dL — ABNORMAL HIGH (ref 6–20)
CO2: 20 mmol/L — ABNORMAL LOW (ref 22–32)
Calcium: 8.1 mg/dL — ABNORMAL LOW (ref 8.9–10.3)
Chloride: 97 mmol/L — ABNORMAL LOW (ref 98–111)
Creatinine, Ser: 7.47 mg/dL — ABNORMAL HIGH (ref 0.61–1.24)
GFR, Estimated: 8 mL/min — ABNORMAL LOW (ref >60)
Glucose, Bld: 250 mg/dL — ABNORMAL HIGH (ref 70–99)
Phosphorus: 5.4 mg/dL — ABNORMAL HIGH (ref 2.5–4.6)
Potassium: 5.2 mmol/L — ABNORMAL HIGH (ref 3.5–5.1)
Sodium: 132 mmol/L — ABNORMAL LOW (ref 135–145)

## 2024-07-31 LAB — HEPATITIS B SURFACE ANTIGEN: Hepatitis B Surface Ag: NONREACTIVE

## 2024-07-31 MED ORDER — CEFAZOLIN SODIUM-DEXTROSE 2-4 GM/100ML-% IV SOLN
INTRAVENOUS | Status: AC | PRN
  Administered 2024-07-31: 2 g via INTRAVENOUS

## 2024-07-31 MED ORDER — CEFAZOLIN SODIUM-DEXTROSE 2-4 GM/100ML-% IV SOLN
INTRAVENOUS | Status: AC
Start: 2024-07-31 — End: 2024-07-31
  Filled 2024-07-31: qty 100

## 2024-07-31 MED ORDER — SODIUM ZIRCONIUM CYCLOSILICATE 5 G PO PACK
5.0000 g | PACK | Freq: Once | ORAL | Status: AC
  Administered 2024-07-31: 5 g via ORAL
  Filled 2024-07-31: qty 1

## 2024-07-31 MED ORDER — FENTANYL CITRATE (PF) 100 MCG/2ML IJ SOLN
INTRAMUSCULAR | Status: AC
  Filled 2024-07-31: qty 2

## 2024-07-31 MED ORDER — GELATIN ABSORBABLE 12-7 MM EX MISC
CUTANEOUS | Status: AC
  Filled 2024-07-31: qty 2

## 2024-07-31 MED ORDER — HEPARIN SODIUM (PORCINE) 1000 UNIT/ML IJ SOLN
INTRAMUSCULAR | Status: AC
Start: 2024-07-31 — End: 2024-07-31
  Filled 2024-07-31: qty 10

## 2024-07-31 MED ORDER — INSULIN ASPART 100 UNIT/ML IJ SOLN
2.0000 [IU] | Freq: Once | INTRAMUSCULAR | Status: AC
  Administered 2024-07-31: 2 [IU] via SUBCUTANEOUS

## 2024-07-31 MED ORDER — CHLORHEXIDINE GLUCONATE CLOTH 2 % EX PADS
6.0000 | MEDICATED_PAD | Freq: Every day | CUTANEOUS | Status: DC
  Administered 2024-08-01 – 2024-08-09 (×8): 6 via TOPICAL

## 2024-07-31 MED ORDER — LIDOCAINE-EPINEPHRINE 1 %-1:100000 IJ SOLN
INTRAMUSCULAR | Status: AC
  Filled 2024-07-31: qty 1

## 2024-07-31 MED ORDER — VANCOMYCIN HCL IN DEXTROSE 1-5 GM/200ML-% IV SOLN
1000.0000 mg | INTRAVENOUS | Status: DC
  Administered 2024-08-01 – 2024-08-08 (×4): 1000 mg via INTRAVENOUS
  Filled 2024-07-31 (×2): qty 200

## 2024-07-31 MED ORDER — FENTANYL CITRATE (PF) 100 MCG/2ML IJ SOLN
INTRAMUSCULAR | Status: AC | PRN
  Administered 2024-07-31: 50 ug via INTRAVENOUS

## 2024-07-31 MED ORDER — HEPARIN SODIUM (PORCINE) 1000 UNIT/ML IJ SOLN
4000.0000 [IU] | Freq: Once | INTRAMUSCULAR | Status: AC
  Administered 2024-07-31: 3.8 mL via INTRAVENOUS

## 2024-07-31 MED ORDER — LIDOCAINE-EPINEPHRINE 1 %-1:100000 IJ SOLN
20.0000 mL | Freq: Once | INTRAMUSCULAR | Status: AC
  Administered 2024-07-31: 10 mL via INTRADERMAL

## 2024-07-31 MED ORDER — GELATIN ABSORBABLE 12-7 MM EX MISC
1.0000 | Freq: Once | CUTANEOUS | Status: AC
  Administered 2024-07-31: 1 via TOPICAL
  Filled 2024-07-31: qty 1

## 2024-07-31 MED ORDER — INSULIN GLARGINE 100 UNIT/ML ~~LOC~~ SOLN
5.0000 [IU] | Freq: Every day | SUBCUTANEOUS | Status: DC
  Administered 2024-07-31 – 2024-08-04 (×5): 5 [IU] via SUBCUTANEOUS
  Filled 2024-07-31 (×6): qty 0.05

## 2024-07-31 NOTE — Assessment & Plan Note (Signed)
 Cr increased to 7.47 this morning, baseline appears around 4-5. Home HD schedule TTS. Last session 9/6 morning. Nephrology consulted. IR successfully exchanged catheter. - Appreciate Nephrology recs - AM RFP

## 2024-07-31 NOTE — Assessment & Plan Note (Addendum)
 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 CT knee 9/6 with soft tissue thickening suspicious for subQ abscess. Remains afebrile, pain well-controlled at this time. Knee aspiration drawn by EDP.  - Cont Vanc T/Th/Sat w/ HD and CTX daily (9/6 -) - Pending knee aspiration culture - narrow abx as indicated   - No growth in 2 days on culture. Stain reveals abundant gram negative rods and gram positive cocci - Pain regimen: Oxycodone  2.5-5 mg q6prn, Tylenol  prn  - Fall precautions

## 2024-07-31 NOTE — Assessment & Plan Note (Signed)
 Hgb stable 7.8 > 7.8 this morning. Suspect secondary to chronic disease. Per chart review, unclear documentation of CAD, appears unlikely given Cardiology office notes and no cath reports.  - AM CBC - Transfusion threshold <7

## 2024-07-31 NOTE — Inpatient Diabetes Management (Signed)
 Inpatient Diabetes Program Recommendations  AACE/ADA: New Consensus Statement on Inpatient Glycemic Control (2015)  Target Ranges:  Prepandial:   less than 140 mg/dL      Peak postprandial:   less than 180 mg/dL (1-2 hours)      Critically ill patients:  140 - 180 mg/dL   Lab Results  Component Value Date   GLUCAP 289 (H) 07/31/2024   HGBA1C 7.3 (H) 06/09/2024    Review of Glycemic Control  Latest Reference Range & Units 07/30/24 08:17 07/30/24 12:22 07/30/24 16:23 07/30/24 21:38 07/31/24 06:45 07/31/24 10:38  Glucose-Capillary 70 - 99 mg/dL 759 (H) 681 (H) 753 (H) 304 (H) 223 (H) 289 (H)  (H): Data is abnormally high Diabetes history: Type 2 DM Outpatient Diabetes medications: Humalog  0-6 units TID Current orders for Inpatient glycemic control: Novolog  0-6 units TID  Inpatient Diabetes Program Recommendations:    Consider adding Lantus  6 units every day  Thanks, Tinnie Minus, MSN, RNC-OB Diabetes Coordinator [phone] (8a-5p)<

## 2024-07-31 NOTE — Assessment & Plan Note (Addendum)
 GERD - continue home pantoprazole  HLD / PAD - continue home lipitor HTN / SVT - continue home hydralazine , diltiazem , carvedilol , and losartan  (held hydral, diltiazem , and carvedilol  today) Depression - continue home fluoxetine 

## 2024-07-31 NOTE — Progress Notes (Signed)
 Pt receives out-pt HD at [facility] clinic, TTS, 0630 chair time.   Lavanda Corran Lalone Dialysis Navigator [phone]

## 2024-07-31 NOTE — Plan of Care (Addendum)
 Notified Gomes DO about patient requesting someone to round on him d/t his low heart rate and drainage from cath site, IR also aware of patient wanting his cath dressing changed and site looked at. Will continue to monitor patient.   Patient placed on telemetry, Gomes DO d/t round shortly to further assess.    Problem: Education: Goal: Knowledge of General Education information will improve Description: Including pain rating scale, medication(s)/side effects and non-pharmacologic comfort measures Outcome: Progressing   Problem: Activity: Goal: Risk for activity intolerance will decrease Outcome: Progressing   Problem: Pain Managment: Goal: General experience of comfort will improve and/or be controlled Outcome: Progressing   Problem: Safety: Goal: Ability to remain free from injury will improve Outcome: Progressing   Problem: Skin Integrity: Goal: Risk for impaired skin integrity will decrease Outcome: Progressing

## 2024-07-31 NOTE — Progress Notes (Signed)
 Pharmacy Antibiotic Note  [name] [name] is a 53 y.o. male admitted on 07/29/2024 with R knee abscess.  Pharmacy has been consulted for vancomycin  dosing; also on ceftriaxone .  Received vancomycin  2000 mg IV load on 9/6. IR exchanged HD cath 9/8. Plan is for HD on 9/9. He is afebrile, WBC normal, and knee abscess culture pending.  Plan: Vancomycin  1000 mg IV after HD on TTS Monitor HD schedule, clinical progress, cultures/sensitivities F/U LOT and de-escalate as able PreHD vancomycin  level as clinically indicated   Height: 6' 5 (195.6 cm) Weight: 108 kg (238 lb) IBW/kg (Calculated) : 89.1  Temp (24hrs), Avg:97.6 F (36.4 C), Min:97.5 F (36.4 C), Max:97.7 F (36.5 C)  Recent Labs  Lab 07/29/24 1429 07/30/24 0448 07/31/24 0702  WBC 8.6 6.6  --   CREATININE 4.28* 5.73* 7.47*    Estimated Creatinine Clearance: 16 mL/min (A) (by C-G formula based on SCr of 7.47 mg/dL (H)).    Allergies  Allergen Reactions   Iron  Nausea And Vomiting    With the iron  tablets   Tape Rash    Rash at site of tape--okay to use paper tape    Antimicrobials this admission: Vancomycin  9/6 >> Ceftriaxone  9/6 >> Cefazolin  x1 9/8  Dose adjustments this admission: N/a  Microbiology results: 9/6 knee abscess: abundant GNR and GPC on gram stain  Thank you for involving pharmacy in this patient's care.  Delon Sax, PharmD, BCPS Clinical Pharmacist Clinical phone for 07/31/2024 is [phone] 07/31/2024 2:15 PM

## 2024-07-31 NOTE — Plan of Care (Signed)
 Went to see patient at bedside regarding concern for bleeding from the site of the newly placed catheter placed by IR.  Does not appear to be actively bleeding at this time.  Nursing reached out to IR regarding bleeding from the site, they will reassess tomorrow along with HD.  Reassured patient that this is not actively bleeding and removing the catheter at this time or any repositioning would likely cause more bleeding.  He understood and agreed to just have the area cleaned slightly.    Also discussed the importance of taking heparin  daily as prescribed while being hospitalized to prevent the risk of VTEs.  Kathrine Melena, DO 07/31/24 6:55 PM

## 2024-07-31 NOTE — Plan of Care (Signed)
  Spoke with IR , Physician Assistant - Kimble Clas. Planing on Patient's iHD catheter exchange  by IR on Monday 07/31/2024. If there will not be time available on Monday, IR will reschedule it for Tuesday before iHD/Discharge.

## 2024-07-31 NOTE — Assessment & Plan Note (Addendum)
 Patient has had persistent elevated between 223-318. A1c 1 month ago 7.3 Patient is only on sliding scale insulin . Given ESRD, adding a conservative regimen of long acting insulin . - Lantus  5u daily started - Continue very sensitive SSI

## 2024-07-31 NOTE — Procedures (Signed)
 Interventional Radiology Procedure Note  Procedure: hd cath exchg    Complications: None  Estimated Blood Loss:  min  Findings: Tip svcra    EMERSON FREDERIC SPECKING, MD

## 2024-07-31 NOTE — Assessment & Plan Note (Addendum)
 Oxygenation and RR stable on room air. CXR from 9/6 with signs of mild fluid.  - Hold home lasix  40 BID 2/2 lower BP and HR. Last void on Saturday. - HD tomorrow, 9/9 per Nephro  - Home Trelegy, albuterol  prn

## 2024-07-31 NOTE — Progress Notes (Signed)
 Contacted attending staff to inquire about possible d/c date in an attempt to avoid HD on day of d/c if possible. Navigator advised pt not stable for d/c at this time. D/C date unknown at present. Will assist as needed.   Randine Mungo Dialysis Navigator [phone]

## 2024-07-31 NOTE — Progress Notes (Signed)
 Daily Progress Note Intern Pager: [phone]  Patient name: [name] [name] Medical record number: [id number] Date of birth: Sep 24, 1971 Age: 53 y.o. Gender: male  Primary Care Provider: Adele Song, MD Consultants: Nephrology Code Status: FULL  Pt Overview and Major Events to Date:  9/6: Admitted to FMTS 9/8: HD Catheter exchanged by IR   Assessment & Plan:  [name] [name] is a 53 y.o. male with a PMH of T2DM, ESRD on HD, HTN, R BKA (2018) admitted for R knee abscess. Receiving Broad IV abx pending R knee aspiration culture.  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 CT knee 9/6 with soft tissue thickening suspicious for subQ abscess. Remains afebrile, pain well-controlled at this time. Knee aspiration drawn by EDP.  - Cont Vanc T/Th/Sat w/ HD and CTX daily (9/6 -) - Pending knee aspiration culture - narrow abx as indicated   - No growth in 2 days on culture. Stain reveals abundant gram negative rods and gram positive cocci - Pain regimen: Oxycodone  2.5-5 mg q6prn, Tylenol  prn  - Fall precautions  Shortness of breath at rest Oxygenation and RR stable on room air. CXR from 9/6 with signs of mild fluid.  - Hold home lasix  40 BID 2/2 lower BP and HR. Last void on Saturday. - HD tomorrow, 9/9 per Nephro  - Home Trelegy, albuterol  prn ESRD on hemodialysis (HCC) Mechanical complication of dialysis catheter (HCC) Cr increased to 7.47 this morning, baseline appears around 4-5. Home HD schedule TTS. Last session 9/6 morning. Nephrology consulted. IR successfully exchanged catheter. - Appreciate Nephrology recs - AM RFP Uncontrolled type 2 diabetes mellitus with hyperglycemia, with long-term current use of insulin  [facility]) Patient has had persistent elevated between 223-318. A1c 1 month ago 7.3 Patient is only on sliding scale insulin . Given ESRD, adding a conservative regimen of long acting insulin . - Lantus  5u daily  started - Continue very sensitive SSI  Sinus bradycardia by electrocardiogram Pt has had HR 45-56 bpm since 7am today. EKG shows sinus rhythm. Normal to moderate range blood pressures.  - Hold Coreg , Diltiazem , Lasix , and Hydralazine  today Anemia of chronic disease Hgb stable 7.8 > 7.8 this morning. Suspect secondary to chronic disease. Per chart review, unclear documentation of CAD, appears unlikely given Cardiology office notes and no cath reports.  - AM CBC - Transfusion threshold <7 Chronic health problem GERD - continue home pantoprazole  HLD / PAD - continue home lipitor HTN / SVT - continue home hydralazine , diltiazem , carvedilol , and losartan  (held hydral, diltiazem , and carvedilol  today) Depression - continue home fluoxetine    FEN/GI: NPO for procedure PPx: heparin  5000 units Q8h Dispo:Home pending clinical improvement .  Subjective:  Pt reports small improvement in his breathing compared to yesterday. He reports persistent pain to the R knee. He states that yesterday his leg was swollen and too big to put his prosthetic leg on yesterday. He put on his prosthesis sleeve to try and compress the leg. He states that his leg has since reduced in size. He states that the pain has not improved.   Objective: Temp:  [97.5 F (36.4 C)-98.1 F (36.7 C)] 97.6 F (36.4 C) (09/08 0756) Pulse Rate:  [50-63] 56 (09/08 0756) Resp:  [18-20] 20 (09/08 0756) BP: (128-149)/(64-78) 128/68 (09/08 0756) SpO2:  [94 %-98 %] 94 % (09/08 0756) Physical Exam: General: NAD Cardiovascular: Regular rhythm, bradycardic rate Respiratory: lungs clear to auscultation bilaterally with no wheezes or crackling.  Limited  Extremities: nonfluctuant abscess to medial posterior aspect of R knee. Moderately TTP.  Laboratory: Most recent CBC Lab Results  Component Value Date   WBC 6.6 07/30/2024   HGB 7.8 (L) 07/30/2024   HCT 27.3 (L) 07/30/2024   MCV 75.8 (L) 07/30/2024   PLT 118 (L) 07/30/2024   Most  recent BMP    Latest Ref Rng & Units 07/31/2024    7:02 AM  BMP  Glucose 70 - 99 mg/dL 749   BUN 6 - 20 mg/dL 45   Creatinine 9.38 - 1.24 mg/dL 2.52   Sodium 864 - 854 mmol/L 132   Potassium 3.5 - 5.1 mmol/L 5.2   Chloride 98 - 111 mmol/L 97   CO2 22 - 32 mmol/L 20   Calcium  8.9 - 10.3 mg/dL 8.1      Imaging/Diagnostic Tests: Radiologist Impression:  IMPRESSION: Successful fluoroscopic exchange of the right IJ tunneled HD catheter. (23 cm tip to cuff palindrome catheter). Catheter ready for use. Electronically Signed   By: CHRISTELLA.  Shick M.D.   On: 07/31/2024 09:57    [name], [name], Medical Student 07/31/2024, 8:24 AM  MS4 AI, [place] [facility] FPTS Intern pager: [phone], text pages welcome Secure chat group [facility] Teaching Service   I have discussed the above with Student Doctor [name] and agree with the documented plan. My edits for correction/addition/clarification are included above. Please see any attending notes.   Kathrine Melena, DO PGY-2, [place] [facility] [facility] 07/31/2024 4:32 PM

## 2024-07-31 NOTE — Assessment & Plan Note (Addendum)
 Pt has had HR 45-56 bpm since 7am today. EKG shows sinus rhythm. Normal to moderate range blood pressures.  - Hold Coreg , Diltiazem , Lasix , and Hydralazine  today

## 2024-07-31 NOTE — Progress Notes (Signed)
 [facility] Progress Note   Subjective:   Seen in room - s/p TDC exchange. Some blood on bandage, so RN plans to have IV team change the bandage. No CP/dyspnea. Remains on abx for R stump wound. Has ACE wrap over LUE AVF - removed, + bruit. For HD tomorrow.  Objective Vitals:   07/31/24 0925 07/31/24 0930 07/31/24 0935 07/31/24 0950  BP: 136/66 123/64 132/65 130/64  Pulse: (!) 47 (!) 46 (!) 45 (!) 48  Resp: 12 14 15 12   Temp:      TempSrc:      SpO2: 95% 94% 94% 94%  Weight:      Height:       Physical Exam General:  Chronically ill appearing man, NAD Heart: RRR; no murmur Lungs: CTA anteriorly Abdomen: soft Extremities: R BKA, no LLE edema - skin shriveled Dialysis Access: TDC in R chest, LUE AVF +t/b  Additional Objective Labs: Basic Metabolic Panel: Recent Labs  Lab 07/29/24 1429 07/30/24 0448 07/31/24 0702  NA 139 134* 132*  K 4.0 4.1 5.2*  CL 102 99 97*  CO2 22 24 20*  GLUCOSE 249* 239* 250*  BUN 14 27* 45*  CREATININE 4.28* 5.73* 7.47*  CALCIUM  8.5* 8.0* 8.1*  PHOS  --  3.3 5.4*   Liver Function Tests: Recent Labs  Lab 07/30/24 0448 07/31/24 0702  ALBUMIN  2.6* 2.6*   CBC: Recent Labs  Lab 07/29/24 1429 07/30/24 0448  WBC 8.6 6.6  NEUTROABS 7.1  --   HGB 7.8* 7.8*  HCT 27.3* 27.3*  MCV 76.0* 75.8*  PLT 111* 118*   Blood Culture    Component Value Date/Time   SDES KNEE 07/29/2024 1414   SPECREQUEST ABSCESS POSTERIOR KNEE 07/29/2024 1414   CULT  07/29/2024 1414    NO GROWTH 2 DAYS Performed at [facility], [address], [place], [place] [address]    REPTSTATUS PENDING 07/29/2024 1414    Studies/Results: IR TUNNELED CENTRAL VENOUS CATH [facility] W IMG Result Date: 07/31/2024 INDICATION: Chronic right internal jugular tunneled HD catheter, damaged catheter externally EXAM: FLUOROSCOPIC EXCHANGE OF THE RIGHT IJ TUNNELED HD CATHETER MEDICATIONS: 2 G ANCEF ; The antibiotic was administered within an appropriate time interval  prior to skin puncture. ANESTHESIA/SEDATION: Moderate (conscious) sedation was employed during this procedure. A total of Versed  0 mg and Fentanyl  50 mcg was administered intravenously by the radiology nurse. Total intra-service moderate Sedation Time: 13 minutes. The patient's level of consciousness and vital signs were monitored continuously by radiology nursing throughout the procedure under my direct supervision. FLUOROSCOPY: Radiation Exposure Index (as provided by the fluoroscopic device): 3.0 mGy Kerma COMPLICATIONS: None immediate. PROCEDURE: Informed written consent was obtained from the patient after a thorough discussion of the procedural risks, benefits and alternatives. All questions were addressed. Maximal Sterile Barrier Technique was utilized including caps, mask, sterile gowns, sterile gloves, sterile drape, hand hygiene and skin antiseptic. A timeout was performed prior to the initiation of the procedure. under sterile conditions and local anesthesia, the right IJ tunneled catheter was released from the subcutaneous tissues with sharp and blunt dissection. catheter successfully exchanged over a stiff glidewire. new catheter tip at the SVC RA junction. images obtained for documentation. catheter secured with prolene suture at the skin site. Blood aspirated easily followed by saline and heparin  flushes. Appropriate volume strength of heparin  instilled in both lumens followed by external caps. Sterile dressing applied. No immediate complication. Patient tolerated the procedure well. IMPRESSION: Successful fluoroscopic exchange of the right  IJ tunneled HD catheter. (23 cm tip to cuff palindrome catheter). Catheter ready for use. Electronically Signed   By: CHRISTELLA.  Shick M.D.   On: 07/31/2024 09:57   DG CHEST PORT 1 VIEW Result Date: 07/29/2024 EXAM: 1 VIEW XRAY OF THE CHEST 07/29/2024 07:40:00 PM COMPARISON: 06/09/2024 CLINICAL HISTORY: Shortness of breath 10026. shob FINDINGS: LUNGS AND PLEURA: Mild  pulmonary edema. Bibasilar airspace opacities, right greater than left. Small left pleural effusion, similar to prior exam. HEART AND MEDIASTINUM: Mild cardiomegaly. BONES AND SOFT TISSUES: No acute osseous abnormality. LINES AND TUBES: Stable right IJ central venous catheter in appropriate position. IMPRESSION: 1. Mild pulmonary edema and bibasilar airspace opacities, right greater than left, increased from prior exam. 2. Small left pleural effusion, similar to prior exam. 3. Mild cardiomegaly. Electronically signed by: Norman Gatlin MD 07/29/2024 08:22 PM EDT RP Workstation: [id number]   CT KNEE RIGHT W CONTRAST Result Date: 07/29/2024 CLINICAL DATA:  Right below-knee amputation, soft tissue abscess EXAM: CT OF THE RIGHT KNEE WITH CONTRAST TECHNIQUE: Multidetector CT imaging was performed following the standard protocol during bolus administration of intravenous contrast. RADIATION DOSE REDUCTION: This exam was performed according to the departmental dose-optimization program which includes automated exposure control, adjustment of the mA and/or kV according to patient size and/or use of iterative reconstruction technique. CONTRAST:  75mL OMNIPAQUE  IOHEXOL  350 MG/ML SOLN COMPARISON:  07/08/2024, 05/25/2024 FINDINGS: Bones/Joint/Cartilage Postsurgical changes from below-knee amputation on the right. The visualized bony structures are osteopenic. No acute or destructive bony abnormalities. Moderate 3 compartmental right knee osteoarthritis. Trace suprapatellar joint effusion. Ligaments Suboptimally assessed by CT. Muscles and Tendons No acute findings. Soft tissues The focal region of subcutaneous soft tissue thickening within the lateral aspect of the right popliteal region is again noted, decreased in prominence since prior study measuring 9 mm on this exam previously measuring 13 mm. There is a new region of soft tissue thickening slightly inferiorly and medially located within the right popliteal fossa,  measuring 2.0 x 4.3 cm in cross-sectional dimension image 91/5, and extending approximately 5.5 cm in craniocaudal extent. Gas lucency is seen within the deep aspect of this soft tissue thickening, concerning for developing abscess. Diffuse atherosclerosis. Stable mild subcutaneous edema along the stump at the below-knee amputation site. Reconstructed images demonstrate no additional findings. IMPRESSION: 1. New focal region of soft tissue thickening within the medial aspect of the popliteal fossa measuring 2.0 x 4.3 x 5.5 cm, with underlying gas lucency suspicious for subcutaneous abscess. 2. Slight improvement in the focal subcutaneous thickening within the lateral aspect of the right popliteal fossa seen previously, with no evidence of underlying fluid collection or abscess in this region. 3. Diffuse osteopenia. No acute or destructive bony process. No evidence of osteomyelitis. Electronically Signed   By: Ozell Daring M.D.   On: 07/29/2024 16:07   Medications:   ceFAZolin  (ANCEF ) IV     cefTRIAXone  (ROCEPHIN )  IV 2 g (07/30/24 2137)    atorvastatin   40 mg Oral Daily   budesonide -glycopyrrolate -formoterol   2 puff Inhalation BID   carvedilol   12.5 mg Oral BID WC   Chlorhexidine  Gluconate Cloth  6 each Topical Daily   diltiazem   300 mg Oral Daily   FLUoxetine   40 mg Oral Daily   furosemide   40 mg Oral BID   gabapentin   300 mg Oral Daily   heparin   5,000 Units Subcutaneous Q8H   hydrALAZINE   200 mg Oral BID   insulin  aspart  0-6 Units Subcutaneous TID WC   losartan   25 mg Oral Daily   pantoprazole   40 mg Oral Daily   sodium chloride  flush  10-40 mL Intracatheter Q12H   tamsulosin   0.4 mg Oral Daily   vancomycin  variable dose per unstable renal function (pharmacist dosing)   Does not apply See admin instructions   Dialysis Orders TTS - [facility] 4:82min, BFR 400, EDW 100.4kg, 2K bath, TDC + AVF, heparin  2000 - last HD 9/6, post-HD weight 101.6kg - Mircera 225mcg IV q 2 weeks (last  9/6)  Assessment/Plan: R knee abscess: Cx NGTD, on Vanc/ceftriaxone . HD access: TDC with limb crack, s/p exchange with IR this AM, has developing LUE AVF +t/b ESRD: Continue HD on TTS schedule - next tomorrow - either here or as outpateint. HTN/volume: BP good, continue home meds. Fluid status drastically improved over past few weeks. Bradycardic today - asymptomatic, but consider reducing BB Anemia of ESRD: Hgb 7.8 - not due for ESA yet Secondary HPTH: Ca/Phos ok - continue home meds. T2DM   Izetta Boehringer, PA-C 07/31/2024, 11:40 AM  [employer]

## 2024-08-01 DIAGNOSIS — L02415 Cutaneous abscess of right lower limb: Secondary | ICD-10-CM | POA: Diagnosis not present

## 2024-08-01 LAB — RENAL FUNCTION PANEL
Albumin: 2.6 g/dL — ABNORMAL LOW (ref 3.5–5.0)
Anion gap: 14 (ref 5–15)
BUN: 61 mg/dL — ABNORMAL HIGH (ref 6–20)
CO2: 20 mmol/L — ABNORMAL LOW (ref 22–32)
Calcium: 7.8 mg/dL — ABNORMAL LOW (ref 8.9–10.3)
Chloride: 97 mmol/L — ABNORMAL LOW (ref 98–111)
Creatinine, Ser: 9.04 mg/dL — ABNORMAL HIGH (ref 0.61–1.24)
GFR, Estimated: 6 mL/min — ABNORMAL LOW (ref >60)
Glucose, Bld: 227 mg/dL — ABNORMAL HIGH (ref 70–99)
Phosphorus: 6.5 mg/dL — ABNORMAL HIGH (ref 2.5–4.6)
Potassium: 5.6 mmol/L — ABNORMAL HIGH (ref 3.5–5.1)
Sodium: 131 mmol/L — ABNORMAL LOW (ref 135–145)

## 2024-08-01 LAB — CBC
HCT: 26.8 % — ABNORMAL LOW (ref 39.0–52.0)
Hemoglobin: 7.6 g/dL — ABNORMAL LOW (ref 13.0–17.0)
MCH: 21.6 pg — ABNORMAL LOW (ref 26.0–34.0)
MCHC: 28.4 g/dL — ABNORMAL LOW (ref 30.0–36.0)
MCV: 76.1 fL — ABNORMAL LOW (ref 80.0–100.0)
Platelets: 109 K/uL — ABNORMAL LOW (ref 150–400)
RBC: 3.52 MIL/uL — ABNORMAL LOW (ref 4.22–5.81)
RDW: 18.6 % — ABNORMAL HIGH (ref 11.5–15.5)
WBC: 8.1 K/uL (ref 4.0–10.5)
nRBC: 0 % (ref 0.0–0.2)

## 2024-08-01 LAB — BODY FLUID CULTURE W GRAM STAIN: Culture: NO GROWTH

## 2024-08-01 LAB — GLUCOSE, CAPILLARY
Glucose-Capillary: 235 mg/dL — ABNORMAL HIGH (ref 70–99)
Glucose-Capillary: 144 mg/dL — ABNORMAL HIGH (ref 70–99)
Glucose-Capillary: 237 mg/dL — ABNORMAL HIGH (ref 70–99)
Glucose-Capillary: 201 mg/dL — ABNORMAL HIGH (ref 70–99)

## 2024-08-01 MED ORDER — CARVEDILOL 12.5 MG PO TABS
12.5000 mg | ORAL_TABLET | Freq: Two times a day (BID) | ORAL | Status: DC
  Administered 2024-08-01 – 2024-08-09 (×12): 12.5 mg via ORAL
  Filled 2024-08-01 (×13): qty 1

## 2024-08-01 MED ORDER — VANCOMYCIN HCL IN DEXTROSE 1-5 GM/200ML-% IV SOLN
INTRAVENOUS | Status: AC
Start: 2024-08-01 — End: 2024-08-01
  Filled 2024-08-01: qty 200

## 2024-08-01 MED ORDER — HYDRALAZINE HCL 20 MG/ML IJ SOLN: 2.0000 mg | INTRAMUSCULAR | Status: AC | PRN

## 2024-08-01 NOTE — Assessment & Plan Note (Signed)
 GERD - continue home pantoprazole  HLD / PAD - continue home Lipitor Depression - continue home fluoxetine 

## 2024-08-01 NOTE — Assessment & Plan Note (Signed)
 Blood glucose continues to be elevated. A1c 1 month ago 7.3 Patient is only on sliding scale insulin . Given ESRD, adding a conservative regimen of long acting insulin . - Lantus  5u daily started yesterday - Continue very sensitive SSI

## 2024-08-01 NOTE — Progress Notes (Signed)
 Daily Progress Note Intern Pager: [phone]  Patient name: [name] [name] Medical record number: [id number] Date of birth: 21-Aug-1971 Age: 53 y.o. Gender: male  Primary Care Provider: Adele Song, MD Consultants: Nephrology,  Code Status: Full  Pt Overview and Major Events to Date:  9/6: Admitted to [facility] 9/8: HD Catheter exchanged by IR  Medical Decision Making:  [name] [name] is a 53 y.o. male with a PMH of T2DM, ESRD on HD, HTN, R BKA (2018) admitted for R knee abscess. Receiving Broad IV abx pending R knee aspiration culture.  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 CT knee 9/6 with soft tissue thickening suspicious for subQ abscess. Remains afebrile, pain well-controlled at this time. Knee aspiration drawn by EDP.  - Cont Vanc T/Th/Sat w/ HD and change CTX to cefazolin  daily (9/6 - 9/12) - Pending knee aspiration culture - narrow abx as indicated   - No growth in 2 days on culture. Stain reveals abundant gram negative rods and gram positive cocci - Pain regimen: Oxycodone  2.5-5 mg q6prn, Tylenol  prn  - Fall precautions  Shortness of breath at rest Oxygenation and RR stable on room air. CXR from 9/6 with signs of mild fluid.  - Hold home lasix  40 BID 2/2 lower BP and HR. Last void on Saturday. - HD done today - Home Trelegy, albuterol  prn ESRD on hemodialysis (HCC) Mechanical complication of dialysis catheter (HCC) Cr increased to 9.04 this morning, baseline appears around 4-5. HD performed this morning. - Appreciate Nephrology recs - AM RFP Uncontrolled type 2 diabetes mellitus with hyperglycemia, with long-term current use of insulin  (HCC) Blood glucose continues to be elevated. A1c 1 month ago 7.3 Patient is only on sliding scale insulin . Given ESRD, adding a conservative regimen of long acting insulin . - Lantus  5u daily started yesterday - Continue very sensitive SSI  Severe hypertension Pt refused  hydralazine  last night. Had several severe blood pressures overnight up to 208/94. Gave hydralazine  dose early this morning. -continue home hydralazine , and losartan   - Reinstating carvedilol  12.5 mg BID given elevated BP  - will continue to monitor. Sinus bradycardia by electrocardiogram Pulse rates significantly improved today. HR ranges from 63-98. - Hold, Diltiazem , Lasix  today - Reinstating carvedilol  12.5 mg BID given elevated BP  Anemia of chronic disease Hgb stable 7.8 > 7.6 this morning. Suspect secondary to chronic disease. Per chart review, unclear documentation of CAD, appears unlikely given Cardiology office notes and no cath reports.  - AM CBC - Transfusion threshold <7 unless symptomatic Chronic health problem GERD - continue home pantoprazole  HLD / PAD - continue home Lipitor Depression - continue home fluoxetine   FEN/GI: Carb Modified  PPx: heparin  Dispo:Home pending clinical improvement .   Subjective:  Pt states he is feeling ok overall. Reports improved dyspnea. Denies abdominal pain. States that the HD port was fixed and is no longer bleeding. Reports pain associated with her R knee is unchanged. Denies any other medical concerns at this time.   Objective: Temp:  [97.6 F (36.4 C)-97.8 F (36.6 C)] 97.6 F (36.4 C) (09/09 0515) Pulse Rate:  [42-92] 92 (09/09 0651) Resp:  [12-20] 18 (09/09 0515) BP: (111-208)/(59-94) 180/86 (09/09 0651) SpO2:  [94 %-97 %] 97 % (09/09 0515) Physical Exam: General: NAD Cardiovascular: Normal rate and rhythm Respiratory: lungs clear to auscultation bilaterally without wheezes or crackles.  Abdomen: RUQ TTP. Other quadrants nontender. No distension.  Extremities: no edema.  Abscess to posterior-medial aspect of R knee without fluctuance. Tender to palpation to area.   Laboratory: Most recent CBC Lab Results  Component Value Date   WBC 6.6 07/30/2024   HGB 7.8 (L) 07/30/2024   HCT 27.3 (L) 07/30/2024   MCV 75.8 (L)  07/30/2024   PLT 118 (L) 07/30/2024   Most recent BMP    Latest Ref Rng & Units 07/31/2024    7:02 AM  BMP  Glucose 70 - 99 mg/dL 749   BUN 6 - 20 mg/dL 45   Creatinine 9.38 - 1.24 mg/dL 2.52   Sodium 864 - 854 mmol/L 132   Potassium 3.5 - 5.1 mmol/L 5.2   Chloride 98 - 111 mmol/L 97   CO2 22 - 32 mmol/L 20   Calcium  8.9 - 10.3 mg/dL 8.1      [name] [name], [name], Medical Student 08/01/2024, 7:21 AM  MS4 AI, [facility] FPTS Intern pager: [phone], text pages welcome Secure chat group [facility] Teaching Service   I agree with the assessment and plan as documented above.  Stuart Redo, MD PGY-3, [facility]

## 2024-08-01 NOTE — Assessment & Plan Note (Addendum)
 Hgb stable 7.8 > 7.6 this morning. Suspect secondary to chronic disease. Per chart review, unclear documentation of CAD, appears unlikely given Cardiology office notes and no cath reports.  - AM CBC - Transfusion threshold <7 unless symptomatic

## 2024-08-01 NOTE — Assessment & Plan Note (Signed)
 Pulse rates significantly improved today. HR ranges from 63-98. - Hold, Diltiazem , Lasix  today - Reinstating carvedilol  12.5 mg BID given elevated BP

## 2024-08-01 NOTE — Progress Notes (Signed)
   08/01/24 1147  Vitals  Temp 98.3 F (36.8 C)  Pulse Rate (!) 103  Resp 17  BP (!) 217/100  SpO2 96 %  O2 Device Room Air  Weight 106.4 kg  Type of Weight Post-Dialysis  Oxygen Therapy  Patient Activity (if Appropriate) In bed  Pulse Oximetry Type Continuous  Oximetry Probe Site Changed No  Post Treatment  Dialyzer Clearance Lightly streaked  Hemodialysis Intake (mL) 200 mL  Liters Processed 84  Fluid Removed (mL) 3500 mL  Tolerated HD Treatment Yes   Received patient in bed to unit.  Alert and oriented.  Informed consent signed and in chart.   TX duration:3.5hrs  Patient tolerated well.  Transported back to the room  Alert, without acute distress.  Hand-off given to patient's nurse.   Access used: [facility] Access issues: none  Total UF removed: 3.5L Medication(s) given: Vanc 1G    Na[facility]Shaminy T [facility]

## 2024-08-01 NOTE — Progress Notes (Signed)
 [facility] Progress Note   Subjective:   Seen in dialysis.  Tolerating treatment fine.   Objective Vitals:   08/01/24 0529 08/01/24 0651 08/01/24 0804 08/01/24 0809  BP: (!) 192/93 (!) 180/86 (!) 182/88 (!) 184/87  Pulse: 91 92 97 98  Resp:   16 17  Temp:   97.9 F (36.6 C)   TempSrc:      SpO2:   96% 96%  Weight:   109.9 kg   Height:       Physical Exam General:  Chronically ill appearing man, NAD Heart: RRR; no murmur Lungs: CTA anteriorly Abdomen: soft Extremities: R BKA, no LLE edema - skin shriveled Dialysis Access: TDC in R chest, LUE AVF +t/b  Additional Objective Labs: Basic Metabolic Panel: Recent Labs  Lab 07/29/24 1429 07/30/24 0448 07/31/24 0702  NA 139 134* 132*  K 4.0 4.1 5.2*  CL 102 99 97*  CO2 22 24 20*  GLUCOSE 249* 239* 250*  BUN 14 27* 45*  CREATININE 4.28* 5.73* 7.47*  CALCIUM  8.5* 8.0* 8.1*  PHOS  --  3.3 5.4*   Liver Function Tests: Recent Labs  Lab 07/30/24 0448 07/31/24 0702  ALBUMIN  2.6* 2.6*   CBC: Recent Labs  Lab 07/29/24 1429 07/30/24 0448  WBC 8.6 6.6  NEUTROABS 7.1  --   HGB 7.8* 7.8*  HCT 27.3* 27.3*  MCV 76.0* 75.8*  PLT 111* 118*   Blood Culture    Component Value Date/Time   SDES KNEE 07/29/2024 1414   SPECREQUEST ABSCESS POSTERIOR KNEE 07/29/2024 1414   CULT  07/29/2024 1414    NO GROWTH 2 DAYS Performed at [facility], [address], [place], [place] [address]    REPTSTATUS PENDING 07/29/2024 1414    Studies/Results: IR TUNNELED CENTRAL VENOUS CATH [facility] W IMG Result Date: 07/31/2024 INDICATION: Chronic right internal jugular tunneled HD catheter, damaged catheter externally EXAM: FLUOROSCOPIC EXCHANGE OF THE RIGHT IJ TUNNELED HD CATHETER MEDICATIONS: 2 G ANCEF ; The antibiotic was administered within an appropriate time interval prior to skin puncture. ANESTHESIA/SEDATION: Moderate (conscious) sedation was employed during this procedure. A total of Versed  0 mg and Fentanyl  50 mcg  was administered intravenously by the radiology nurse. Total intra-service moderate Sedation Time: 13 minutes. The patient's level of consciousness and vital signs were monitored continuously by radiology nursing throughout the procedure under my direct supervision. FLUOROSCOPY: Radiation Exposure Index (as provided by the fluoroscopic device): 3.0 mGy Kerma COMPLICATIONS: None immediate. PROCEDURE: Informed written consent was obtained from the patient after a thorough discussion of the procedural risks, benefits and alternatives. All questions were addressed. Maximal Sterile Barrier Technique was utilized including caps, mask, sterile gowns, sterile gloves, sterile drape, hand hygiene and skin antiseptic. A timeout was performed prior to the initiation of the procedure. under sterile conditions and local anesthesia, the right IJ tunneled catheter was released from the subcutaneous tissues with sharp and blunt dissection. catheter successfully exchanged over a stiff glidewire. new catheter tip at the SVC RA junction. images obtained for documentation. catheter secured with prolene suture at the skin site. Blood aspirated easily followed by saline and heparin  flushes. Appropriate volume strength of heparin  instilled in both lumens followed by external caps. Sterile dressing applied. No immediate complication. Patient tolerated the procedure well. IMPRESSION: Successful fluoroscopic exchange of the right IJ tunneled HD catheter. (23 cm tip to cuff palindrome catheter). Catheter ready for use. Electronically Signed   By: CHRISTELLA.  Shick M.D.   On: 07/31/2024 09:57  Medications:  cefTRIAXone  (ROCEPHIN )  IV 2 g (07/31/24 2144)   vancomycin       atorvastatin   40 mg Oral Daily   budesonide -glycopyrrolate -formoterol   2 puff Inhalation BID   Chlorhexidine  Gluconate Cloth  6 each Topical Q0600   diltiazem   300 mg Oral Daily   FLUoxetine   40 mg Oral Daily   furosemide   40 mg Oral BID   gabapentin   300 mg Oral Daily    heparin   5,000 Units Subcutaneous Q8H   hydrALAZINE   200 mg Oral BID   insulin  aspart  0-6 Units Subcutaneous TID WC   insulin  glargine  5 Units Subcutaneous Daily   losartan   25 mg Oral Daily   pantoprazole   40 mg Oral Daily   sodium chloride  flush  10-40 mL Intracatheter Q12H   tamsulosin   0.4 mg Oral Daily   Dialysis Orders TTS - [facility] 4:74min, BFR 400, EDW 100.4kg, 2K bath, TDC + AVF, heparin  2000 - last HD 9/6, post-HD weight 101.6kg - Mircera 225mcg IV q 2 weeks (last 9/6)  Assessment/Plan: R knee abscess: Cx NGTD, on Vanc/ceftriaxone . HD access: TDC with limb crack, s/p exchange with IR 9/8, has developing LUE AVF +t/b ESRD: Continue HD on TTS schedule - HD today HTN/volume: BP good, continue home meds. Fluid status drastically improved over past few weeks.  Anemia of ESRD: Hgb 7.8 - not due for ESA yet; no IV iron  in setting of serious infection.  Secondary HPTH: Ca/Phos ok - continue home meds. T2DM Ok for d/c from nephrology perspective.   Manuelita Barters MD [facility] Pager [phone]

## 2024-08-01 NOTE — Inpatient Diabetes Management (Signed)
 Inpatient Diabetes Program Recommendations  AACE/ADA: New Consensus Statement on Inpatient Glycemic Control (2015)  Target Ranges:  Prepandial:   less than 140 mg/dL      Peak postprandial:   less than 180 mg/dL (1-2 hours)      Critically ill patients:  140 - 180 mg/dL   Lab Results  Component Value Date   GLUCAP 235 (H) 08/01/2024   HGBA1C 7.3 (H) 06/09/2024    Review of Glycemic Control  Latest Reference Range & Units 07/31/24 06:45 07/31/24 10:38 07/31/24 16:48 07/31/24 21:19 08/01/24 06:41  Glucose-Capillary 70 - 99 mg/dL 776 (H) 710 (H) 658 (H) 270 (H) 235 (H)  (H): Data is abnormally high  Diabetes history: Type 2 DM Outpatient Diabetes medications: Humalog  0-6 units TID Current orders for Inpatient glycemic control: Novolog  0-6 units TID  Inpatient Diabetes Program Recommendations:    Might consider,   Lantus  10 units every day Novolog  2 units TID with meals if he consumes at least 50%  Thank you, Wyvonna Pinal, MSN, CDCES Diabetes Coordinator Inpatient Diabetes Program [phone] (team pager from 8a-5p)

## 2024-08-01 NOTE — Assessment & Plan Note (Signed)
 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 CT knee 9/6 with soft tissue thickening suspicious for subQ abscess. Remains afebrile, pain well-controlled at this time. Knee aspiration drawn by EDP.  - Cont Vanc T/Th/Sat w/ HD and change CTX to cefazolin  daily (9/6 - 9/12) - Pending knee aspiration culture - narrow abx as indicated   - No growth in 2 days on culture. Stain reveals abundant gram negative rods and gram positive cocci - Pain regimen: Oxycodone  2.5-5 mg q6prn, Tylenol  prn  - Fall precautions

## 2024-08-01 NOTE — Assessment & Plan Note (Signed)
 Cr increased to 9.04 this morning, baseline appears around 4-5. HD performed this morning. - Appreciate Nephrology recs - AM RFP

## 2024-08-01 NOTE — Assessment & Plan Note (Signed)
 Oxygenation and RR stable on room air. CXR from 9/6 with signs of mild fluid.  - Hold home lasix  40 BID 2/2 lower BP and HR. Last void on Saturday. - HD done today - Home Trelegy, albuterol  prn

## 2024-08-01 NOTE — Assessment & Plan Note (Signed)
 Pt refused hydralazine  last night. Had several severe blood pressures overnight up to 208/94. Gave hydralazine  dose early this morning. -continue home hydralazine , and losartan   - Reinstating carvedilol  12.5 mg BID given elevated BP  - will continue to monitor.

## 2024-08-01 NOTE — Discharge Summary (Shared)
 Family Medicine Teaching [facility] Discharge Summary  Patient name: [name] [name] Medical record number: [id number] Date of birth: 19-Apr-1971 Age: 53 y.o. Gender: male Date of Admission: 07/29/2024  Date of Discharge: 08/09/2024 Admitting Physician: Kathrine Melena, DO  Primary Care Provider: Adele Song, MD Consultants: Nephrology, IR  Indication for Hospitalization: R knee abscess and HD catheter issue  Discharge Diagnoses/Problem List:  Principal Problem for Admission: R knee abscess Other Problems addressed during stay:  Principal Problem:   Abscess of right knee Active Problems:   Severe hypertension   Uncontrolled type 2 diabetes mellitus with hyperglycemia, with long-term current use of insulin  (HCC)   Mechanical complication of dialysis catheter (HCC)   ESRD on hemodialysis (HCC)   Anemia of chronic disease   Sinus bradycardia by electrocardiogram  Brief Hospital Course:  [name] [name] is a 53 y.o.male with a history of T2DM, HTN, R BKA, PAD, and ESRD on HD TTS who was admitted to the [facility] Medicine Teaching Service at [facility] for R knee abscess. His hospital course is detailed below:  R Popliteal Fossa Abscess  Patient presented with concerns of R knee/popliteal fossa swelling and pain with history of recurrent infections and R BKA.  Labs with no leukocytosis and patient has remained afebrile throughout admission.  CT R knee revealed new soft tissue thickening within the medial aspect of the popliteal fossa with gas lucency suspicious for subcutaneous abscess.  Abscess was aspirated in the ED and sent for culture, which demonstrates many gram positive cocci and gram negative rods, but no growth on culture.  Broad spectrum vancomycin  and CTX initiated on 9/6. Increased size and fluctuance of abscess on 9/10  and ortho consulted, who performed debridement on 9/12 without complication. They found an infected hematoma on excision. Ortho recommends continued pressure  offloading and to not wear his prosthesis for about 2 weeks, will follow up outpatient. PT/OT recommended SNF placement in the interim. Will complete Vancomycin  and ceftazidine course on 9/19. This will be given at Hemodialysis   Pleural Effusion CXR 9/11 shows increased bilateral pleural effusions and bibasilar airspace disease and cardiomegaly. Most likely associated with insufficient HD volume given reduction of symptoms after HD. Restarted home Lasix  40 mg BID along with regular HD sessions with improvement.  T2DM A1c 7.3 08/25, however with sustained elevation in CBGs, Lantus  was increased to 22 u at discharge.  Dialysis Catheter Issue Issue arose after dialysis on 9/6, on the arterial side of the catheter a plastic piece was stuck and came off the catheter after HD. IR and VVS evaluated the patient in the ED and catheter was replaced on 9/8 inpatient for inpatient HD sessions.   Other chronic conditions were medically managed with home medications and formulary alternatives as necessary (HTN, PAD, Anemia of Chronic Disease, GERD, HLD, Depression)  PCP Follow-up Recommendations: Follow up with orthopedics 9/19. Ensure completion of abx with HD  Disposition: Home  Discharge Condition: Good  Discharge Exam:  Vitals:   08/09/24 0340 08/09/24 0827  BP: (!) 153/80 (!) 170/78  Pulse: 84 84  Resp: 17   Temp: 98.3 F (36.8 C) 98.7 F (37.1 C)  SpO2: 99% 98%   Physical Exam: General: NAD, nontoxic appearing Cardiovascular: regular rate and rhythm Respiratory: no wheezes or crackles. Good air movement throughout Abdomen: soft, nontender, nondistended Extremities: RLE bandaging clean and dry. Moving limbs normally. No edema noted  Significant Procedures:  Procedure: hd cath exchg   Complications: None Estimated Blood Loss:  min Findings: Tip  svcra   EMERSON FREDERIC SPECKING, MD  PROCEDURE: Excisional debridement abscess popliteal fossa right knee with excision skin soft tissue muscle  fascia and pseudocyst.   OPERATIVE FINDINGS: Patient had a large pseudocyst with infected hematoma.  The cyst and pseudocapsule were excised.  Tissue margins were clear.   SURGEON:  Jerona LULLA Sage, MD   Significant Labs and Imaging:  Recent Labs  Lab 08/08/24 9366  WBC 7.5  HGB 7.3*  HCT 25.4*  PLT 162   Recent Labs  Lab 08/08/24 0633 08/09/24 0401  NA 129* 134*  K 5.8* 5.0  CL 93* 91*  CO2 22 24  GLUCOSE 213* 126*  BUN 80* 51*  CREATININE 9.82* 7.03*  CALCIUM  7.7* 7.7*  PHOS 7.3* 5.6*  ALBUMIN  2.6* 2.4*    Imaging:  EXAM: CT OF THE RIGHT KNEE WITH CONTRAST 07/29/2024 IMPRESSION: 1. New focal region of soft tissue thickening within the medial aspect of the popliteal fossa measuring 2.0 x 4.3 x 5.5 cm, with underlying gas lucency suspicious for subcutaneous abscess. 2. Slight improvement in the focal subcutaneous thickening within the lateral aspect of the right popliteal fossa seen previously, with no evidence of underlying fluid collection or abscess in this region. 3. Diffuse osteopenia. No acute or destructive bony process. No evidence of osteomyelitis.  1 VIEW XRAY OF THE CHEST 07/29/2024 07:40:00 PM IMPRESSION: 1. Mild pulmonary edema and bibasilar airspace opacities, right greater than left, increased from prior exam. 2. Small left pleural effusion, similar to prior exam. 3. Mild cardiomegaly.  Discharge Medications:  Allergies as of 08/09/2024       Reactions   Iron  Nausea And Vomiting   With the iron  tablets   Tape Rash   Rash at site of tape--okay to use paper tape        Medication List     STOP taking these medications    losartan  25 MG tablet Commonly known as: COZAAR    nicotine  14 mg/24hr patch Commonly known as: NICODERM CQ  - dosed in mg/24 hours       TAKE these medications    Accu-Chek Guide Me w/Device Kit See admin instructions.   Accu-Chek Softclix Lancets lancets Use to check blood sugar twice per day. E11.9    acetaminophen  500 MG tablet Commonly known as: TYLENOL  Take 2 tablets (1,000 mg total) by mouth every 6 (six) hours.   albuterol  108 (90 Base) MCG/ACT inhaler Commonly known as: VENTOLIN  HFA Inhale 2 puffs into the lungs every 6 (six) hours as needed for wheezing or shortness of breath.   atorvastatin  40 MG tablet Commonly known as: LIPITOR Take 1 tablet (40 mg total) by mouth daily.   calcitRIOL  0.25 MCG capsule Commonly known as: ROCALTROL  Take 1 capsule (0.25 mcg total) by mouth Every Tuesday,Thursday,and Saturday with dialysis.   Calcium  Acetate 667 MG Tabs Take 1,334 mg by mouth 3 (three) times daily. What changed: when to take this   carvedilol  12.5 MG tablet Commonly known as: COREG  Take 1 tablet (12.5 mg total) by mouth 2 (two) times daily with a meal.   cefTAZidime  IVPB Commonly known as: FORTAZ  Inject 2 g into the vein Every Tuesday,Thursday,and Saturday with dialysis for 1 dose. Indication:  R popliteal fossa abscess First Dose: No Last Day of Therapy:  08/11/24 Labs - Once weekly:  CBC/D and BMP, Labs - Once weekly: ESR and CRP Method of administration: IV Push Method of administration may be changed at the discretion of home infusion pharmacist based upon assessment of  the patient and/or caregiver's ability to self-administer the medication ordered. Start taking on: August 10, 2024   diltiazem  300 MG 24 hr capsule Commonly known as: CARDIZEM  CD Take 1 capsule (300 mg total) by mouth daily.   FLUoxetine  40 MG capsule Commonly known as: PROZAC  Take 1 capsule (40 mg total) by mouth daily.   fluticasone  50 MCG/ACT nasal spray Commonly known as: FLONASE  Place 2 sprays into both nostrils daily. What changed:  when to take this reasons to take this   furosemide  40 MG tablet Commonly known as: LASIX  Take 40 mg by mouth 2 (two) times daily.   gabapentin  300 MG capsule Commonly known as: NEURONTIN  TAKE 1 CAPSULE(300 MG) BY MOUTH DAILY   glucose blood  test strip 1 each by Other route as needed for other. Use as instructed   glucose blood test strip Commonly known as: AgaMatrix Presto Test Check blood sugars twice daily before meals   HumaLOG  100 UNIT/ML injection Generic drug: insulin  lispro 0-6 Units, Subcutaneous, 3 times daily with meals, First dose on Sun 10/17/23 at 0800 Correction coverage: Very Sensitive (ESRD/Dialysis) CBG < 70: Implement Hypoglycemia Standing Orders and refer to Hypoglycemia Standing Orders sidebar report CBG 70 - 120: 0 units CBG 121 - 150: 0 units CBG 151 - 200: 1 unit CBG 201-250: 2 units CBG 251-300: 3 units CBG 301-350: 4 units CBG 351-400: 5 units CBG > 400: Give 6 units and call MD   hydrALAZINE  100 MG tablet Commonly known as: APRESOLINE  Take 2 tablets (200 mg total) by mouth 2 (two) times daily.   insulin  glargine 100 UNIT/ML injection Commonly known as: LANTUS  Inject 0.22 mLs (22 Units total) into the skin daily.   loratadine  10 MG tablet Commonly known as: Claritin  Take 1 tablet (10 mg total) by mouth daily.   methocarbamol  750 MG tablet Commonly known as: ROBAXIN  Take 1 tablet (750 mg total) by mouth every 8 (eight) hours as needed for muscle spasms.   ondansetron  4 MG disintegrating tablet Commonly known as: ZOFRAN -ODT Take 1 tablet (4 mg total) by mouth every 8 (eight) hours as needed for nausea or vomiting.   Oxycodone  HCl 10 MG Tabs Take 1 tablet (10 mg total) by mouth every 4 (four) hours as needed for severe pain (pain score 7-10).   pantoprazole  40 MG tablet Commonly known as: PROTONIX  TAKE 1 TABLET(40 MG) BY MOUTH DAILY   polyethylene glycol 17 g packet Commonly known as: MIRALAX  / GLYCOLAX  Take 17 g by mouth daily.   senna 8.6 MG Tabs tablet Commonly known as: SENOKOT Take 1 tablet (8.6 mg total) by mouth daily.   sodium zirconium cyclosilicate  10 g Pack packet Commonly known as: LOKELMA  Take 10 g by mouth daily.   tamsulosin  0.4 MG Caps capsule Commonly  known as: FLOMAX  Take 1 capsule (0.4 mg total) by mouth daily.   Trelegy Ellipta  100-62.5-25 MCG/ACT Aepb Generic drug: Fluticasone -Umeclidin-Vilant INHALE 1 PUFF INTO LUNGS DAILY   vancomycin  IVPB Inject 1,000 mg into the vein Every Tuesday,Thursday,and Saturday with dialysis for 1 dose. Indication:  R popliteal fossa abscess First Dose: No Last Day of Therapy:  08/11/24 Labs - Sunday/Monday:  CBC/D, BMP, and vancomycin  trough. Labs - Thursday:  BMP and vancomycin  trough Labs - Once weekly: ESR and CRP Method of administration:Elastomeric Method of administration may be changed at the discretion of the patient and/or caregiver's ability to self-administer the medication ordered. Start taking on: August 10, 2024  Home Infusion Instuctions  (From admission, onward)           Start     Ordered   08/09/24 0000  Home infusion instructions       Comments: Per HD  Question:  Instructions  Answer:  Flushing of vascular access device: 0.9% NaCl pre/post medication administration and prn patency; Heparin  100 u/ml, 5ml for implanted ports and Heparin  10u/ml, 5ml for all other central venous catheters.   08/09/24 1013            Discharge Instructions: Please refer to Patient Instructions section of EMR for full details.  Patient was counseled important signs and symptoms that should prompt return to medical care, changes in medications, dietary instructions, activity restrictions, and follow up appointments.   Follow-Up Appointments:  Follow-up Information     Adele Song, MD Follow up.   Specialty: Family Medicine Contact information: [address] [place] [address] [phone]         Harden Jerona GAILS, MD Follow up in 1 week(s).   Specialty: Orthopedic Surgery Contact information: [address] [place] [address] [phone]         Center, [facility] on 08/10/2024.   Why: Schedule is Tuesday, Thursday, Saturday  with 6:30 am chair time. Contact information: [address] [place] [address] [phone]                 Benjamine [name], Medical Student 08/09/2024, 10:14 AM MS4 AI, [facility]   I agree with the assessment and plan as documented above.  Stuart Redo, MD PGY-3, [facility]

## 2024-08-02 DIAGNOSIS — L02415 Cutaneous abscess of right lower limb: Secondary | ICD-10-CM | POA: Diagnosis not present

## 2024-08-02 LAB — CBC
HCT: 25.6 % — ABNORMAL LOW (ref 39.0–52.0)
Hemoglobin: 7.5 g/dL — ABNORMAL LOW (ref 13.0–17.0)
MCH: 21.5 pg — ABNORMAL LOW (ref 26.0–34.0)
MCHC: 29.3 g/dL — ABNORMAL LOW (ref 30.0–36.0)
MCV: 73.4 fL — ABNORMAL LOW (ref 80.0–100.0)
Platelets: 125 K/uL — ABNORMAL LOW (ref 150–400)
RBC: 3.49 MIL/uL — ABNORMAL LOW (ref 4.22–5.81)
RDW: 18.7 % — ABNORMAL HIGH (ref 11.5–15.5)
WBC: 6.2 K/uL (ref 4.0–10.5)
nRBC: 0 % (ref 0.0–0.2)

## 2024-08-02 LAB — RENAL FUNCTION PANEL
Albumin: 2.5 g/dL — ABNORMAL LOW (ref 3.5–5.0)
Anion gap: 14 (ref 5–15)
BUN: 45 mg/dL — ABNORMAL HIGH (ref 6–20)
CO2: 24 mmol/L (ref 22–32)
Calcium: 7.7 mg/dL — ABNORMAL LOW (ref 8.9–10.3)
Chloride: 95 mmol/L — ABNORMAL LOW (ref 98–111)
Creatinine, Ser: 6.96 mg/dL — ABNORMAL HIGH (ref 0.61–1.24)
GFR, Estimated: 9 mL/min — ABNORMAL LOW (ref >60)
Glucose, Bld: 236 mg/dL — ABNORMAL HIGH (ref 70–99)
Phosphorus: 3.8 mg/dL (ref 2.5–4.6)
Potassium: 4.4 mmol/L (ref 3.5–5.1)
Sodium: 133 mmol/L — ABNORMAL LOW (ref 135–145)

## 2024-08-02 LAB — GLUCOSE, CAPILLARY
Glucose-Capillary: 195 mg/dL — ABNORMAL HIGH (ref 70–99)
Glucose-Capillary: 212 mg/dL — ABNORMAL HIGH (ref 70–99)
Glucose-Capillary: 324 mg/dL — ABNORMAL HIGH (ref 70–99)
Glucose-Capillary: 141 mg/dL — ABNORMAL HIGH (ref 70–99)

## 2024-08-02 LAB — HEPATITIS B SURFACE ANTIBODY, QUANTITATIVE: Hep B S AB Quant (Post): <3.5 m[IU]/mL — ABNORMAL LOW

## 2024-08-02 MED ORDER — HYDRALAZINE HCL 25 MG PO TABS: 25.0000 mg | ORAL_TABLET | Freq: Three times a day (TID) | ORAL | Status: DC | PRN

## 2024-08-02 NOTE — Assessment & Plan Note (Addendum)
 Blood glucose levels improved. A1c 1 month ago 7.3.   - Continue Lantus  5u daily w/ very sensitive SSI

## 2024-08-02 NOTE — Progress Notes (Signed)
 [facility] Progress Note   Subjective:   Seen in room.  Had HD yesterday no issues.  Concerned to go home as knee still painful and has issue has been recurrent - plans to d/w primary team.   Objective Vitals:   08/01/24 2214 08/02/24 0417 08/02/24 0810 08/02/24 0812  BP:  (!) 190/91  (!) 185/85  Pulse:  91 90 92  Resp:  18 16 16   Temp:  98 F (36.7 C)  98.4 F (36.9 C)  TempSrc:  Oral  Oral  SpO2: 95% 99%  98%  Weight:      Height:       Physical Exam General:  Chronically ill appearing man, NAD Heart: RRR; no murmur Lungs: CTA anteriorly Abdomen: soft Extremities: R BKA, no LLE edema - skin shriveled Dialysis Access: TDC in R chest, LUE AVF +t/b - small dried blood on dressing - looks good  Additional Objective Labs: Basic Metabolic Panel: Recent Labs  Lab 07/31/24 0702 08/01/24 0824 08/02/24 0500  NA 132* 131* 133*  K 5.2* 5.6* 4.4  CL 97* 97* 95*  CO2 20* 20* 24  GLUCOSE 250* 227* 236*  BUN 45* 61* 45*  CREATININE 7.47* 9.04* 6.96*  CALCIUM  8.1* 7.8* 7.7*  PHOS 5.4* 6.5* 3.8   Liver Function Tests: Recent Labs  Lab 07/31/24 0702 08/01/24 0824 08/02/24 0500  ALBUMIN  2.6* 2.6* 2.5*   CBC: Recent Labs  Lab 07/29/24 1429 07/30/24 0448 08/01/24 0824 08/02/24 0500  WBC 8.6 6.6 8.1 6.2  NEUTROABS 7.1  --   --   --   HGB 7.8* 7.8* 7.6* 7.5*  HCT 27.3* 27.3* 26.8* 25.6*  MCV 76.0* 75.8* 76.1* 73.4*  PLT 111* 118* 109* 125*   Blood Culture    Component Value Date/Time   SDES KNEE 07/29/2024 1414   SPECREQUEST ABSCESS POSTERIOR KNEE 07/29/2024 1414   CULT  07/29/2024 1414    NO GROWTH 3 DAYS Performed at [facility], [address] [place], [place] [address]    REPTSTATUS 08/01/2024 FINAL 07/29/2024 1414    Studies/Results: No results found.  Medications:  cefTRIAXone  (ROCEPHIN )  IV Stopped (08/01/24 2300)   vancomycin  200 mL/hr at 08/02/24 0530    atorvastatin   40 mg Oral Daily    budesonide -glycopyrrolate -formoterol   2 puff Inhalation BID   carvedilol   12.5 mg Oral BID WC   Chlorhexidine  Gluconate Cloth  6 each Topical Q0600   diltiazem   300 mg Oral Daily   FLUoxetine   40 mg Oral Daily   furosemide   40 mg Oral BID   gabapentin   300 mg Oral Daily   heparin   5,000 Units Subcutaneous Q8H   hydrALAZINE   200 mg Oral BID   insulin  aspart  0-6 Units Subcutaneous TID WC   insulin  glargine  5 Units Subcutaneous Daily   losartan   25 mg Oral Daily   pantoprazole   40 mg Oral Daily   sodium chloride  flush  10-40 mL Intracatheter Q12H   tamsulosin   0.4 mg Oral Daily   Dialysis Orders TTS - [facility] 4:20min, BFR 400, EDW 100.4kg, 2K bath, TDC + AVF, heparin  2000 - last HD 9/6, post-HD weight 101.6kg - Mircera 225mcg IV q 2 weeks (last 9/6)  Assessment/Plan: R knee abscess: Cx NGTD, on Vanc/ceftriaxone  here plans for 1 more dose vanc and cefepime  at outpt HD tomorrow if does d/c today - confirmed they have both meds in stock. HD access: TDC with limb crack, s/p exchange with IR 9/8, has developing LUE  AVF +t/b ESRD: Continue HD on TTS schedule - HD tommorow  HTN/volume: BP very labile - was 110-210 in the past 24h and he says this is not uncommon; volume looks fine but is over EDW by bed weights though down from admission.  Was having dyspnea when BP was > 200, add hydralazine  PRN SBP > 190. Would just d/c on previous home meds and maintain prior  EDW Anemia of ESRD: Hgb 7.8 - not due for ESA yet; no IV iron  in setting of serious infection.  Can resume IV iron  next week  Secondary HPTH: Ca/Phos ok - continue home meds. T2DM  Ok for d/c from nephrology perspective but pt concerned to d/c today and plans to d/w primary.  Manuelita Barters MD [facility] Pager [phone]

## 2024-08-02 NOTE — Care Management Important Message (Signed)
 Important Message  Patient Details  Name: [name] [name] MRN: [id number] Date of Birth: 20-Nov-1971   Important Message Given:  Yes - Medicare IM     Jon Cruel 08/02/2024, 4:00 PM

## 2024-08-02 NOTE — Assessment & Plan Note (Addendum)
 Cr increased to 6.96 this morning after HD.  - Appreciate Nephrology recs - AM RFP Tu/Th/Sat

## 2024-08-02 NOTE — Assessment & Plan Note (Addendum)
 Pt refused hydralazine  last night. Had several severe blood pressures overnight up to 208/94. Gave hydralazine  dose early this morning. - Continue home hydralazine  and losartan   - Restarting carvedilol  12.5 mg BID given elevated BP

## 2024-08-02 NOTE — Assessment & Plan Note (Addendum)
 GERD - continue home pantoprazole  HLD / PAD - continue home Lipitor Depression - continue home fluoxetine 

## 2024-08-02 NOTE — Plan of Care (Signed)

## 2024-08-02 NOTE — Progress Notes (Signed)
 Contacted attending staff to inquire about possible d/c date. Navigator advised pt will require inpt HD tomorrow. Will assist as needed.  Randine Mungo Dialysis Navigator [phone]

## 2024-08-02 NOTE — Assessment & Plan Note (Addendum)
 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 CT knee 9/6 with soft tissue thickening suspicious for subQ abscess. Remains afebrile, pain well-controlled at this time. Knee aspiration drawn by EDP.  - Cont Vanc T/Th/Sat w/ HD and change CTX to cefazolin  daily. Will discuss course length with ID and Ortho - Knee aspiration culture without growth x3 days. Will continue broad abx for full course.  - Stain reveals abundant gram negative rods and gram positive cocci - Plan to consult ortho to discuss surgical vs antibiotic treatment for abscess - Pain regimen: Oxycodone  2.5-5 mg q6prn, Tylenol  prn  - Fall precautions

## 2024-08-02 NOTE — Progress Notes (Signed)
 Daily Progress Note Intern Pager: [phone]  Patient name: [name] [name] Medical record number: [id number] Date of birth: 1971/09/24 Age: 53 y.o. Gender: male  Primary Care Provider: Adele Song, MD Consultants: Nephrology Code Status: Full  Pt Overview and Major Events to Date:  9/6: Admitted to FMTS 9/8: HD Catheter exchanged by IR  Medical Decision Making:  [name] [name] is a 53 y.o. male with a PMH of T2DM, ESRD on HD, HTN, R BKA (2018) admitted for R knee abscess. Receiving Broad IV abx with no growth on aspiration culture. 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 CT knee 9/6 with soft tissue thickening suspicious for subQ abscess. Remains afebrile, pain well-controlled at this time. Knee aspiration drawn by EDP.  - Cont Vanc T/Th/Sat w/ HD and change CTX to cefazolin  daily. Will discuss course length with ID and Ortho - Knee aspiration culture without growth x3 days. Will continue broad abx for full course.  - Stain reveals abundant gram negative rods and gram positive cocci - Plan to consult ortho to discuss surgical vs antibiotic treatment for abscess - Pain regimen: Oxycodone  2.5-5 mg q6prn, Tylenol  prn  - Fall precautions  Shortness of breath at rest Oxygenation and RR stable on room air.   - Restarted Lasix  40 mg BID - HD Tu/Th/Sat as scheduled - Home Trelegy, albuterol  prn ESRD on hemodialysis (HCC) Mechanical complication of dialysis catheter (HCC) Cr increased to 6.96 this morning after HD.  - Appreciate Nephrology recs - AM RFP Tu/Th/Sat Uncontrolled type 2 diabetes mellitus with hyperglycemia, with long-term current use of insulin  (HCC) Blood glucose levels improved. A1c 1 month ago 7.3.   - Continue Lantus  5u daily w/ very sensitive SSI  Severe hypertension Pt refused hydralazine  last night. Had several severe blood pressures overnight up to 208/94. Gave hydralazine  dose early this  morning. - Continue home hydralazine  and losartan   - Restarting carvedilol  12.5 mg BID given elevated BP  Sinus bradycardia by electrocardiogram Pulse rates significantly improved today. HR ranges from 63-98. - Restart Diltiazem , Lasix , and Coreg  today Anemia of chronic disease Hgb stable 7.6 > 7.5 this morning. Suspect secondary to chronic disease. Per chart review, unclear documentation of CAD, appears unlikely given Cardiology office notes and no cath reports.  - AM CBC - Transfusion threshold <7 unless symptomatic, patient was offered a transfusion d/t having some SOB (likely symptomatic anemia), but would like to hold off on any transfusions at this time  Chronic health problem GERD - continue home pantoprazole  HLD / PAD - continue home Lipitor Depression - continue home fluoxetine   FEN/GI: Carb modified PPx: heparin  Dispo:Home tomorrow.   Subjective:  Pt states that his breathing is feeling a little better than the day of his admission. Reports that his knee pain has stayed at a 9/10. He also reports RUQ abdominal pain but only with palpation. Denies chest pain. Pt states he was told that he was   Objective: Temp:  [97.8 F (36.6 C)-98.6 F (37 C)] 98 F (36.7 C) (09/10 0417) Pulse Rate:  [91-107] 91 (09/10 0417) Resp:  [12-21] 18 (09/10 0417) BP: (177-218)/(73-103) 190/91 (09/10 0417) SpO2:  [95 %-99 %] 99 % (09/10 0417) Weight:  [106.4 kg-109.9 kg] 106.4 kg (09/09 1147) Physical Exam: General: NAD. Well appearing Cardiovascular: regular rate and rhythm Respiratory: Good air movement throughout.  Abdomen: soft. Tender to deep palpation in RUQ.  Extremities: Abscess to posterior medial aspect of  R knee looks unchanged from yesterday. No fluctuance. TTP. No swelling or erythema.   Laboratory: Most recent CBC Lab Results  Component Value Date   WBC 6.2 08/02/2024   HGB 7.5 (L) 08/02/2024   HCT 25.6 (L) 08/02/2024   MCV 73.4 (L) 08/02/2024   PLT 125 (L) 08/02/2024    Most recent BMP    Latest Ref Rng & Units 08/02/2024    5:00 AM  BMP  Glucose 70 - 99 mg/dL 763   BUN 6 - 20 mg/dL 45   Creatinine 9.38 - 1.24 mg/dL 3.03   Sodium 864 - 854 mmol/L 133   Potassium 3.5 - 5.1 mmol/L 4.4   Chloride 98 - 111 mmol/L 95   CO2 22 - 32 mmol/L 24   Calcium  8.9 - 10.3 mg/dL 7.7      [name], [name], Medical Student 08/02/2024, 7:20 AM  MS4 AI, [facility] FPTS Intern pager: [phone], text pages welcome Secure chat group [facility] Teaching Service    I have discussed the above with Student Doctor [name] and agree with the documented plan. My edits for correction/addition/clarification are included above. Please see any attending notes.   Kathrine Melena, DO PGY-2, [facility] 08/02/2024 4:12 PM

## 2024-08-02 NOTE — Plan of Care (Addendum)
 Called Ortho on call to consult. Information provided regarding patient, awaiting call back.   08/02/24 5:00 PM  Re-consulted ortho since this is an [facility] patient of Dr. Crist. Left a voicemail for Dr. Genelle also sent an epic chat with the rest of the team attached. Would greatly appreciate the non-urgent consult.   Kathrine Melena, DO 08/02/24 8:06 PM

## 2024-08-02 NOTE — Assessment & Plan Note (Addendum)
 Oxygenation and RR stable on room air.   - Restarted Lasix  40 mg BID - HD Tu/Th/Sat as scheduled - Home Trelegy, albuterol  prn

## 2024-08-02 NOTE — Assessment & Plan Note (Addendum)
 Hgb stable 7.6 > 7.5 this morning. Suspect secondary to chronic disease. Per chart review, unclear documentation of CAD, appears unlikely given Cardiology office notes and no cath reports.  - AM CBC - Transfusion threshold <7 unless symptomatic, patient was offered a transfusion d/t having some SOB (likely symptomatic anemia), but would like to hold off on any transfusions at this time

## 2024-08-02 NOTE — Assessment & Plan Note (Addendum)
 Pulse rates significantly improved today. HR ranges from 63-98. - Restart Diltiazem , Lasix , and Coreg  today

## 2024-08-03 ENCOUNTER — Inpatient Hospital Stay (HOSPITAL_COMMUNITY)

## 2024-08-03 DIAGNOSIS — L02415 Cutaneous abscess of right lower limb: Secondary | ICD-10-CM | POA: Diagnosis not present

## 2024-08-03 LAB — CBC
HCT: 24.9 % — ABNORMAL LOW (ref 39.0–52.0)
Hemoglobin: 7.2 g/dL — ABNORMAL LOW (ref 13.0–17.0)
MCH: 21.8 pg — ABNORMAL LOW (ref 26.0–34.0)
MCHC: 28.9 g/dL — ABNORMAL LOW (ref 30.0–36.0)
MCV: 75.2 fL — ABNORMAL LOW (ref 80.0–100.0)
Platelets: 117 K/uL — ABNORMAL LOW (ref 150–400)
RBC: 3.31 MIL/uL — ABNORMAL LOW (ref 4.22–5.81)
RDW: 19.3 % — ABNORMAL HIGH (ref 11.5–15.5)
WBC: 5.6 K/uL (ref 4.0–10.5)
nRBC: 0 % (ref 0.0–0.2)

## 2024-08-03 LAB — GLUCOSE, CAPILLARY
Glucose-Capillary: 203 mg/dL — ABNORMAL HIGH (ref 70–99)
Glucose-Capillary: 117 mg/dL — ABNORMAL HIGH (ref 70–99)
Glucose-Capillary: 268 mg/dL — ABNORMAL HIGH (ref 70–99)

## 2024-08-03 LAB — RENAL FUNCTION PANEL
Albumin: 2.6 g/dL — ABNORMAL LOW (ref 3.5–5.0)
Anion gap: 13 (ref 5–15)
BUN: 61 mg/dL — ABNORMAL HIGH (ref 6–20)
CO2: 22 mmol/L (ref 22–32)
Calcium: 7.9 mg/dL — ABNORMAL LOW (ref 8.9–10.3)
Chloride: 94 mmol/L — ABNORMAL LOW (ref 98–111)
Creatinine, Ser: 8.58 mg/dL — ABNORMAL HIGH (ref 0.61–1.24)
GFR, Estimated: 7 mL/min — ABNORMAL LOW (ref >60)
Glucose, Bld: 230 mg/dL — ABNORMAL HIGH (ref 70–99)
Phosphorus: 5 mg/dL — ABNORMAL HIGH (ref 2.5–4.6)
Potassium: 5.4 mmol/L — ABNORMAL HIGH (ref 3.5–5.1)
Sodium: 129 mmol/L — ABNORMAL LOW (ref 135–145)

## 2024-08-03 MED ORDER — VANCOMYCIN HCL IN DEXTROSE 1-5 GM/200ML-% IV SOLN
INTRAVENOUS | Status: AC
  Filled 2024-08-03: qty 200

## 2024-08-03 MED ORDER — OXYCODONE HCL 5 MG PO TABS
5.0000 mg | ORAL_TABLET | ORAL | Status: DC | PRN
  Administered 2024-08-03 – 2024-08-05 (×6): 5 mg via ORAL
  Filled 2024-08-03 (×6): qty 1

## 2024-08-03 MED ORDER — OXYCODONE HCL 5 MG PO TABS: 2.5000 mg | ORAL_TABLET | ORAL | Status: DC | PRN

## 2024-08-03 MED ORDER — LIDOCAINE HCL (PF) 1 % IJ SOLN: 5.0000 mL | INTRAMUSCULAR | Status: DC | PRN

## 2024-08-03 MED ORDER — HEPARIN SODIUM (PORCINE) 1000 UNIT/ML DIALYSIS: 20.0000 [IU]/kg | INTRAMUSCULAR | Status: DC | PRN

## 2024-08-03 MED ORDER — HEPARIN SODIUM (PORCINE) 1000 UNIT/ML DIALYSIS: 1000.0000 [IU] | INTRAMUSCULAR | Status: DC | PRN

## 2024-08-03 MED ORDER — CEFAZOLIN SODIUM-DEXTROSE 2-4 GM/100ML-% IV SOLN
2.0000 g | INTRAVENOUS | Status: AC
  Administered 2024-08-04: 2 g via INTRAVENOUS

## 2024-08-03 MED ORDER — POVIDONE-IODINE 10 % EX SWAB
2.0000 | Freq: Once | CUTANEOUS | Status: AC
  Administered 2024-08-04: 2 via TOPICAL

## 2024-08-03 MED ORDER — PENTAFLUOROPROP-TETRAFLUOROETH EX AERO: 1.0000 | INHALATION_SPRAY | CUTANEOUS | Status: DC | PRN

## 2024-08-03 MED ORDER — ALTEPLASE 2 MG IJ SOLR: 2.0000 mg | Freq: Once | INTRAMUSCULAR | Status: DC | PRN

## 2024-08-03 MED ORDER — ANTICOAGULANT SODIUM CITRATE 4% (200MG/5ML) IV SOLN: 5.0000 mL | Status: DC | PRN

## 2024-08-03 MED ORDER — LIDOCAINE-PRILOCAINE 2.5-2.5 % EX CREA: 1.0000 | TOPICAL_CREAM | CUTANEOUS | Status: DC | PRN

## 2024-08-03 MED ORDER — CHLORHEXIDINE GLUCONATE 4 % EX SOLN
60.0000 mL | Freq: Once | CUTANEOUS | Status: DC
Start: 2024-08-03 — End: 2024-08-04
  Filled 2024-08-03: qty 60

## 2024-08-03 MED ORDER — INSULIN ASPART 100 UNIT/ML IJ SOLN
3.0000 [IU] | Freq: Once | INTRAMUSCULAR | Status: AC
  Administered 2024-08-03: 3 [IU] via SUBCUTANEOUS

## 2024-08-03 MED ORDER — ACETAMINOPHEN 500 MG PO TABS
1000.0000 mg | ORAL_TABLET | Freq: Once | ORAL | Status: AC
  Administered 2024-08-03: 1000 mg via ORAL
  Filled 2024-08-03: qty 2

## 2024-08-03 NOTE — Assessment & Plan Note (Addendum)
 Oxygenation and RR stable on room air. No dyspnea noted on exam. Reports an episode of SOB overnight when his room got warm.  - Restarted Lasix  40 mg BID - CXR to assess for possible pleural effusion. - HD Tu/Th/Sat as scheduled - Home Trelegy, albuterol  prn

## 2024-08-03 NOTE — Assessment & Plan Note (Addendum)
 Hgb slowly down trending from 7.5 > 7.2 this morning. Suspect secondary to chronic disease. Per chart review, unclear documentation of CAD, appears unlikely given Cardiology office notes and no cath reports.  - AM CBC - Transfusion threshold <7 unless symptomatic, patient was offered a transfusion yesterday d/t having some SOB (likely symptomatic anemia), but would like to hold off on any transfusions at this time.

## 2024-08-03 NOTE — Progress Notes (Signed)
 The patient has completed dialysis treatment without issue. Pt goal met. Vancomycin  1 g IV given during HD.  08/03/24 1450  Vitals  BP (!) 187/87  Pulse Rate 84  Resp (!) 22  Oxygen Therapy  SpO2 96 %  O2 Device Room Air  Patient Activity (if Appropriate) In bed  Pulse Oximetry Type Continuous  Oximetry Probe Site Changed No  During Treatment Monitoring  Blood Flow Rate (mL/min) 400 mL/min  Arterial Pressure (mmHg) -169.48 mmHg  Venous Pressure (mmHg) 147.67 mmHg  TMP (mmHg) 22.42 mmHg  Ultrafiltration Rate (mL/min) 1198 mL/min  Dialysate Flow Rate (mL/min) 299 ml/min  Dialysate Potassium Concentration 3  Dialysate Calcium  Concentration 2.5  Duration of HD Treatment -hour(s) 3.47 hour(s)  Cumulative Fluid Removed (mL) per Treatment  6533.87  HD Safety Checks Performed Yes  Intra-Hemodialysis Comments Tx completed;Tolerated well  Post Treatment  Dialyzer Clearance Lightly streaked  Hemodialysis Intake (mL) 0 mL  Liters Processed 84  Fluid Removed (mL) 3500 mL  Tolerated HD Treatment Yes  Post-Hemodialysis Comments see notes.  Hemodialysis Catheter Right Internal jugular Double lumen Permanent (Tunneled)  Placement Date/Time: 07/31/24 0927   Placed prior to admission: No  Serial / Lot #: [id number]  Expiration Date: 04/22/29  Time Out: Correct patient;Correct site;Correct procedure  Maximum sterile barrier precautions: Hand hygiene;Large sterile sheet;C...  Site Condition No complications  Blue Lumen Status Heparin  locked  Red Lumen Status Heparin  locked  Catheter fill solution Heparin  1000 units/ml  Catheter fill volume (Arterial) 1.9 cc  Catheter fill volume (Venous) 1.9  Dressing Type Transparent  Dressing Status Clean, Dry, Intact;Antimicrobial disc/dressing in place  Interventions New dressing;Dressing changed  Drainage Description None  Dressing Change Due 08/10/24  Post treatment catheter status Capped and Clamped

## 2024-08-03 NOTE — Assessment & Plan Note (Addendum)
 Blood glucose levels improved. A1c 1 month ago 7.3.  BG levels improved. - Continue Lantus  5u daily w/ very sensitive SSI

## 2024-08-03 NOTE — Assessment & Plan Note (Addendum)
 HD scheduled today.  - Appreciate Nephrology recs - AM RFP Tu/Th/Sat

## 2024-08-03 NOTE — Progress Notes (Signed)
 [facility] Progress Note   Subjective:   Seen in room.  For HD today.  OR debridement of abscess tomorrow with Dr. Harden per notes.   Objective Vitals:   08/02/24 2305 08/03/24 0447 08/03/24 0806 08/03/24 0823  BP: (!) 153/77 (!) 143/78  (!) 164/83  Pulse: 75 66  70  Resp:  15  16  Temp:  97.7 F (36.5 C)  97.6 F (36.4 C)  TempSrc:  Oral    SpO2:  97% 98% 100%  Weight:      Height:       Physical Exam General:  Chronically ill appearing man, NAD Heart: RRR; no murmur Lungs: CTA anteriorly Abdomen: soft Extremities: R BKA, no LLE edema - skin shriveled Dialysis Access: TDC in R chest, LUE AVF +t/b - small dried blood on dressing - looks good  Additional Objective Labs: Basic Metabolic Panel: Recent Labs  Lab 07/31/24 0702 08/01/24 0824 08/02/24 0500  NA 132* 131* 133*  K 5.2* 5.6* 4.4  CL 97* 97* 95*  CO2 20* 20* 24  GLUCOSE 250* 227* 236*  BUN 45* 61* 45*  CREATININE 7.47* 9.04* 6.96*  CALCIUM  8.1* 7.8* 7.7*  PHOS 5.4* 6.5* 3.8   Liver Function Tests: Recent Labs  Lab 07/31/24 0702 08/01/24 0824 08/02/24 0500  ALBUMIN  2.6* 2.6* 2.5*   CBC: Recent Labs  Lab 07/29/24 1429 07/30/24 0448 08/01/24 0824 08/02/24 0500  WBC 8.6 6.6 8.1 6.2  NEUTROABS 7.1  --   --   --   HGB 7.8* 7.8* 7.6* 7.5*  HCT 27.3* 27.3* 26.8* 25.6*  MCV 76.0* 75.8* 76.1* 73.4*  PLT 111* 118* 109* 125*   Blood Culture    Component Value Date/Time   SDES KNEE 07/29/2024 1414   SPECREQUEST ABSCESS POSTERIOR KNEE 07/29/2024 1414   CULT  07/29/2024 1414    NO GROWTH 3 DAYS Performed at [facility], [address], [place], [place] [address]    REPTSTATUS 08/01/2024 FINAL 07/29/2024 1414    Studies/Results: No results found.  Medications:  cefTRIAXone  (ROCEPHIN )  IV 2 g (08/02/24 2322)   vancomycin  200 mL/hr at 08/02/24 0530    atorvastatin   40 mg Oral Daily   budesonide -glycopyrrolate -formoterol   2 puff Inhalation BID   carvedilol   12.5 mg Oral  BID WC   Chlorhexidine  Gluconate Cloth  6 each Topical Q0600   diltiazem   300 mg Oral Daily   FLUoxetine   40 mg Oral Daily   furosemide   40 mg Oral BID   gabapentin   300 mg Oral Daily   heparin   5,000 Units Subcutaneous Q8H   hydrALAZINE   200 mg Oral BID   insulin  aspart  0-6 Units Subcutaneous TID WC   insulin  glargine  5 Units Subcutaneous Daily   losartan   25 mg Oral Daily   pantoprazole   40 mg Oral Daily   sodium chloride  flush  10-40 mL Intracatheter Q12H   tamsulosin   0.4 mg Oral Daily   Dialysis Orders TTS - [facility] 4:32min, BFR 400, EDW 100.4kg, 2K bath, TDC + AVF, heparin  2000 - last HD 9/6, post-HD weight 101.6kg - Mircera 225mcg IV q 2 weeks (last 9/6)  Assessment/Plan: R knee abscess: Cx NGTD, on Vanc/ceftriaxone  here plans for 1 more dose vanc and cefepime  at outpt HD tomorrow if does d/c today - confirmed they have both meds in stock. HD access: TDC with limb crack, s/p exchange with IR 9/8, has developing LUE AVF +t/b ESRD: Continue HD on TTS schedule - HD  today  HTN/volume: BP can be very labile - looking acceptable in the past 12h, no changes to meds at this time.  Anemia of ESRD: Hgb 7s - not due for ESA yet; no IV iron  in setting of serious infection. Secondary HPTH: Ca/Phos ok - continue home meds. T2DM  Ok for d/c from nephrology perspective when medically ready.  Manuelita Barters MD [facility] Pager [phone]

## 2024-08-03 NOTE — Progress Notes (Addendum)
 Daily Progress Note Intern Pager: [phone]  Patient name: [name] [name] Medical record number: [id number] Date of birth: May 21, 1971 Age: 53 y.o. Gender: male  Primary Care Provider: Adele Song, MD Consultants: Nephrology, Orthopedic surgery Code Status: Full  Pt Overview and Major Events to Date:  9/6: Admitted to [facility] 9/8: HD Catheter exchanged by IR  Medical Decision Making:  [name] [name] is a 53 y.o. male with a PMH of T2DM, ESRD on HD, HTN, R BKA (2018) admitted for R knee abscess. Receiving Broad IV abx with no growth on aspiration culture. Orthopedic surgery following. 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 CT knee 9/6 with soft tissue thickening suspicious for subQ abscess. Remains afebrile, however pain has worsened. Knee aspiration drawn by EDP. Now with increased fluctuance and size on exam, ortho was contacted.  - Cont Vanc T/Th/Sat w/ HD and Ceftriaxone  2 g q24 H. Will confirm length after I and D 9/12  - Knee aspiration culture final read without growth x3 days. Will continue broad abx for full course.  - Stain reveals abundant gram negative rods and gram positive cocci - Ortho consulted and will likely perform I&D tomorrow (9/12). Put on NPO at midnight and held heparin  tomorrow  - Pain regimen: Oxycodone  2.5-5 mg changed to q4prn from q6prn, Tylenol  prn  - Fall precautions  Shortness of breath at rest Oxygenation and RR stable on room air. No dyspnea noted on exam. Reports an episode of SOB overnight when his room got warm.  - Restarted Lasix  40 mg BID - CXR to assess for possible pleural effusion. - HD Tu/Th/Sat as scheduled - Home Trelegy, albuterol  prn ESRD on hemodialysis (HCC) Mechanical complication of dialysis catheter (HCC) HD scheduled today.  - Appreciate Nephrology recs - AM RFP Tu/Th/Sat Uncontrolled type 2 diabetes mellitus with hyperglycemia, with long-term current use of  insulin  (HCC) Blood glucose levels improved. A1c 1 month ago 7.3.  BG levels improved. - Continue Lantus  5u daily w/ very sensitive SSI  Severe hypertension Blood pressures improved from 2 days ago. Range between 143/78-164/83. This is baseline according to patient. - Continue home hydralazine  and losartan   - Restarting carvedilol  12.5 mg BID given elevated BP  Sinus bradycardia by electrocardiogram Pulse rates remain stable.  - continue Diltiazem , Lasix , and Coreg   Anemia of chronic disease Hgb slowly down trending from 7.5 > 7.2 this morning. Suspect secondary to chronic disease. Per chart review, unclear documentation of CAD, appears unlikely given Cardiology office notes and no cath reports.  - AM CBC - Transfusion threshold <7 unless symptomatic, patient was offered a transfusion yesterday d/t having some SOB (likely symptomatic anemia), but would like to hold off on any transfusions at this time.  Chronic health problem GERD - continue home pantoprazole  HLD / PAD - continue home Lipitor Depression - continue home fluoxetine    FEN/GI: Carb modified PPx: heparin  Dispo:Home after ortho evaluation and possible interventions.   Subjective:  Pt states he is feeling ok.  States that around 1 AM last night he had dyspnea while lying in bed and believes this is because his room was warm. He states that the [facility] in his room does not blow cold air starting at around 11pm. Reports persistent pain to the abscess behind his R knee. Denies any dyspnea or chest pain currently. Denies abdominal pain. Pt is concerned that he needs to get his HD at a certain time to make  sure his medications work properly.  Objective: Temp:  [97.7 F (36.5 C)-98.4 F (36.9 C)] 97.7 F (36.5 C) (09/11 0447) Pulse Rate:  [66-92] 66 (09/11 0447) Resp:  [15-16] 15 (09/11 0447) BP: (143-185)/(65-85) 143/78 (09/11 0447) SpO2:  [97 %-98 %] 97 % (09/11 0447) Physical Exam: General: NAD. Nontoxic  appearing Cardiovascular: rate and rhythm regular Respiratory: clear to auscultation bilaterally. No wheezes or crackles present.  Abdomen: soft, nontender, nondistended Extremities: moving all extremities normally. Abscess in the posterior medial aspect of R knee shows slight increase in size and fluctuance. Continued tenderness to palpation   Laboratory: Most recent CBC Lab Results  Component Value Date   WBC 6.2 08/02/2024   HGB 7.5 (L) 08/02/2024   HCT 25.6 (L) 08/02/2024   MCV 73.4 (L) 08/02/2024   PLT 125 (L) 08/02/2024   Most recent BMP    Latest Ref Rng & Units 08/02/2024    5:00 AM  BMP  Glucose 70 - 99 mg/dL 763   BUN 6 - 20 mg/dL 45   Creatinine 9.38 - 1.24 mg/dL 3.03   Sodium 864 - 854 mmol/L 133   Potassium 3.5 - 5.1 mmol/L 4.4   Chloride 98 - 111 mmol/L 95   CO2 22 - 32 mmol/L 24   Calcium  8.9 - 10.3 mg/dL 7.7      [name], [name], Medical Student 08/03/2024, 7:37 AM  MS4 AI, [facility] FPTS Intern pager: [phone], text pages welcome Secure chat group [facility] Teaching Service   I agree with the assessment and plan as documented above.  Stuart Redo, MD PGY-3, [facility]

## 2024-08-03 NOTE — Assessment & Plan Note (Addendum)
 GERD - continue home pantoprazole  HLD / PAD - continue home Lipitor Depression - continue home fluoxetine 

## 2024-08-03 NOTE — Assessment & Plan Note (Addendum)
 Blood pressures improved from 2 days ago. Range between 143/78-164/83. This is baseline according to patient. - Continue home hydralazine  and losartan   - Restarting carvedilol  12.5 mg BID given elevated BP

## 2024-08-03 NOTE — Assessment & Plan Note (Addendum)
 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 CT knee 9/6 with soft tissue thickening suspicious for subQ abscess. Remains afebrile, however pain has worsened. Knee aspiration drawn by EDP. Now with increased fluctuance and size on exam, ortho was contacted.  - Cont Vanc T/Th/Sat w/ HD and Ceftriaxone  2 g q24 H. Will confirm length after I and D 9/12  - Knee aspiration culture final read without growth x3 days. Will continue broad abx for full course.  - Stain reveals abundant gram negative rods and gram positive cocci - Ortho consulted and will likely perform I&D tomorrow (9/12). Put on NPO at midnight and held heparin  tomorrow  - Pain regimen: Oxycodone  2.5-5 mg changed to q4prn from q6prn, Tylenol  prn  - Fall precautions

## 2024-08-03 NOTE — Plan of Care (Signed)

## 2024-08-03 NOTE — Assessment & Plan Note (Addendum)
 Pulse rates remain stable.  - continue Diltiazem , Lasix , and Coreg 

## 2024-08-04 ENCOUNTER — Other Ambulatory Visit: Payer: Self-pay

## 2024-08-04 ENCOUNTER — Encounter (HOSPITAL_COMMUNITY): Payer: Self-pay | Admitting: Family Medicine

## 2024-08-04 ENCOUNTER — Inpatient Hospital Stay (HOSPITAL_COMMUNITY): Admitting: Anesthesiology

## 2024-08-04 ENCOUNTER — Encounter (HOSPITAL_COMMUNITY)
Admission: EM | Disposition: A | Discharge status: Skilled Nursing Facility | Payer: Self-pay | Source: Ambulatory Visit | Attending: Family Medicine

## 2024-08-04 DIAGNOSIS — N186 End stage renal disease: Secondary | ICD-10-CM

## 2024-08-04 DIAGNOSIS — Z794 Long term (current) use of insulin: Secondary | ICD-10-CM

## 2024-08-04 DIAGNOSIS — Z992 Dependence on renal dialysis: Secondary | ICD-10-CM | POA: Diagnosis not present

## 2024-08-04 DIAGNOSIS — E1165 Type 2 diabetes mellitus with hyperglycemia: Secondary | ICD-10-CM

## 2024-08-04 DIAGNOSIS — F17210 Nicotine dependence, cigarettes, uncomplicated: Secondary | ICD-10-CM | POA: Diagnosis not present

## 2024-08-04 DIAGNOSIS — I120 Hypertensive chronic kidney disease with stage 5 chronic kidney disease or end stage renal disease: Secondary | ICD-10-CM | POA: Diagnosis not present

## 2024-08-04 DIAGNOSIS — L02415 Cutaneous abscess of right lower limb: Secondary | ICD-10-CM

## 2024-08-04 HISTORY — PX: IRRIGATION AND DEBRIDEMENT KNEE: SHX5185

## 2024-08-04 LAB — BASIC METABOLIC PANEL WITH GFR
Anion gap: 13 (ref 5–15)
BUN: 38 mg/dL — ABNORMAL HIGH (ref 6–20)
CO2: 25 mmol/L (ref 22–32)
Calcium: 8.1 mg/dL — ABNORMAL LOW (ref 8.9–10.3)
Chloride: 93 mmol/L — ABNORMAL LOW (ref 98–111)
Creatinine, Ser: 6.23 mg/dL — ABNORMAL HIGH (ref 0.61–1.24)
GFR, Estimated: 10 mL/min — ABNORMAL LOW (ref >60)
Glucose, Bld: 184 mg/dL — ABNORMAL HIGH (ref 70–99)
Potassium: 5 mmol/L (ref 3.5–5.1)
Sodium: 131 mmol/L — ABNORMAL LOW (ref 135–145)

## 2024-08-04 LAB — CBC
HCT: 28 % — ABNORMAL LOW (ref 39.0–52.0)
Hemoglobin: 8.2 g/dL — ABNORMAL LOW (ref 13.0–17.0)
MCH: 21.6 pg — ABNORMAL LOW (ref 26.0–34.0)
MCHC: 29.3 g/dL — ABNORMAL LOW (ref 30.0–36.0)
MCV: 73.9 fL — ABNORMAL LOW (ref 80.0–100.0)
Platelets: 158 K/uL (ref 150–400)
RBC: 3.79 MIL/uL — ABNORMAL LOW (ref 4.22–5.81)
RDW: 19.9 % — ABNORMAL HIGH (ref 11.5–15.5)
WBC: 6 K/uL (ref 4.0–10.5)
nRBC: 0 % (ref 0.0–0.2)

## 2024-08-04 LAB — SURGICAL PCR SCREEN
MRSA, PCR: NEGATIVE
Staphylococcus aureus: NEGATIVE

## 2024-08-04 LAB — GLUCOSE, CAPILLARY
Glucose-Capillary: 223 mg/dL — ABNORMAL HIGH (ref 70–99)
Glucose-Capillary: 219 mg/dL — ABNORMAL HIGH (ref 70–99)
Glucose-Capillary: 160 mg/dL — ABNORMAL HIGH (ref 70–99)
Glucose-Capillary: 135 mg/dL — ABNORMAL HIGH (ref 70–99)
Glucose-Capillary: 127 mg/dL — ABNORMAL HIGH (ref 70–99)
Glucose-Capillary: 387 mg/dL — ABNORMAL HIGH (ref 70–99)

## 2024-08-04 SURGERY — IRRIGATION AND DEBRIDEMENT KNEE
Anesthesia: General | Site: Knee | Laterality: Right

## 2024-08-04 MED ORDER — CHLORHEXIDINE GLUCONATE 0.12 % MT SOLN: 15.0000 mL | Freq: Once | OROMUCOSAL | Status: AC

## 2024-08-04 MED ORDER — VASHE WOUND IRRIGATION OPTIME
TOPICAL | Status: DC | PRN
  Administered 2024-08-04: 34 [oz_av]

## 2024-08-04 MED ORDER — INSULIN ASPART 100 UNIT/ML IJ SOLN: 0.0000 [IU] | INTRAMUSCULAR | Status: DC | PRN

## 2024-08-04 MED ORDER — ONDANSETRON HCL 4 MG/2ML IJ SOLN
INTRAMUSCULAR | Status: DC | PRN
  Administered 2024-08-04: 4 mg via INTRAVENOUS

## 2024-08-04 MED ORDER — FENTANYL CITRATE (PF) 100 MCG/2ML IJ SOLN
25.0000 ug | INTRAMUSCULAR | Status: DC | PRN
  Administered 2024-08-04: 50 ug via INTRAVENOUS
  Administered 2024-08-04: 25 ug via INTRAVENOUS

## 2024-08-04 MED ORDER — 0.9 % SODIUM CHLORIDE (POUR BTL) OPTIME
TOPICAL | Status: DC | PRN
  Administered 2024-08-04: 1000 mL

## 2024-08-04 MED ORDER — ACETAMINOPHEN 650 MG RE SUPP: 650.0000 mg | Freq: Four times a day (QID) | RECTAL | Status: DC

## 2024-08-04 MED ORDER — ACETAMINOPHEN 500 MG PO TABS
1000.0000 mg | ORAL_TABLET | Freq: Four times a day (QID) | ORAL | Status: DC
  Administered 2024-08-04 – 2024-08-09 (×15): 1000 mg via ORAL
  Filled 2024-08-04 (×17): qty 2

## 2024-08-04 MED ORDER — DEXAMETHASONE SODIUM PHOSPHATE 10 MG/ML IJ SOLN
INTRAMUSCULAR | Status: DC | PRN
  Administered 2024-08-04: 10 mg via INTRAVENOUS

## 2024-08-04 MED ORDER — LIDOCAINE 2% (20 MG/ML) 5 ML SYRINGE
INTRAMUSCULAR | Status: DC | PRN
  Administered 2024-08-04: 60 mg via INTRAVENOUS

## 2024-08-04 MED ORDER — MIDAZOLAM HCL 2 MG/2ML IJ SOLN
INTRAMUSCULAR | Status: AC
  Filled 2024-08-04: qty 2

## 2024-08-04 MED ORDER — FENTANYL CITRATE (PF) 250 MCG/5ML IJ SOLN
INTRAMUSCULAR | Status: AC
  Filled 2024-08-04: qty 5

## 2024-08-04 MED ORDER — OXYCODONE HCL 5 MG PO TABS: 5.0000 mg | ORAL_TABLET | Freq: Once | ORAL | Status: DC | PRN

## 2024-08-04 MED ORDER — FENTANYL CITRATE (PF) 250 MCG/5ML IJ SOLN
INTRAMUSCULAR | Status: DC | PRN
  Administered 2024-08-04: 50 ug via INTRAVENOUS

## 2024-08-04 MED ORDER — DEXAMETHASONE SODIUM PHOSPHATE 10 MG/ML IJ SOLN
INTRAMUSCULAR | Status: AC
  Filled 2024-08-04: qty 1

## 2024-08-04 MED ORDER — FENTANYL CITRATE (PF) 100 MCG/2ML IJ SOLN
INTRAMUSCULAR | Status: AC
  Filled 2024-08-04: qty 2

## 2024-08-04 MED ORDER — MIDAZOLAM HCL 2 MG/2ML IJ SOLN
INTRAMUSCULAR | Status: DC | PRN
  Administered 2024-08-04: 2 mg via INTRAVENOUS

## 2024-08-04 MED ORDER — PROPOFOL 10 MG/ML IV BOLUS
INTRAVENOUS | Status: DC | PRN
Start: 2024-08-04 — End: 2024-08-04
  Administered 2024-08-04: 140 mg via INTRAVENOUS

## 2024-08-04 MED ORDER — ORAL CARE MOUTH RINSE: 15.0000 mL | Freq: Once | OROMUCOSAL | Status: AC

## 2024-08-04 MED ORDER — CHLORHEXIDINE GLUCONATE 0.12 % MT SOLN
OROMUCOSAL | Status: AC
  Administered 2024-08-04: 15 mL via OROMUCOSAL
  Filled 2024-08-04: qty 15

## 2024-08-04 MED ORDER — ONDANSETRON HCL 4 MG/2ML IJ SOLN
INTRAMUSCULAR | Status: AC
  Filled 2024-08-04: qty 2

## 2024-08-04 MED ORDER — CEFAZOLIN SODIUM 1 G IJ SOLR
INTRAMUSCULAR | Status: AC
Start: 2024-08-04 — End: 2024-08-04
  Filled 2024-08-04: qty 20

## 2024-08-04 MED ORDER — SODIUM CHLORIDE 0.9 % IV SOLN: INTRAVENOUS | Status: DC

## 2024-08-04 MED ORDER — ENSURE PRE-SURGERY PO LIQD
296.0000 mL | Freq: Once | ORAL | Status: AC
  Administered 2024-08-04: 296 mL via ORAL
  Filled 2024-08-04: qty 296

## 2024-08-04 MED ORDER — OXYCODONE HCL 5 MG/5ML PO SOLN: 5.0000 mg | Freq: Once | ORAL | Status: DC | PRN

## 2024-08-04 MED ORDER — PROPOFOL 10 MG/ML IV BOLUS
INTRAVENOUS | Status: AC
  Filled 2024-08-04: qty 20

## 2024-08-04 SURGICAL SUPPLY — 40 items
BAG COUNTER SPONGE SURGICOUNT (BAG) IMPLANT
BLADE SURG 21 STRL SS (BLADE) ×1 IMPLANT
BNDG COHESIVE 1X5 TAN STRL LF (GAUZE/BANDAGES/DRESSINGS) IMPLANT
BNDG COHESIVE 4X5 TAN STRL LF (GAUZE/BANDAGES/DRESSINGS) IMPLANT
BNDG COHESIVE 6X5 TAN NS LF (GAUZE/BANDAGES/DRESSINGS) IMPLANT
BNDG COHESIVE 6X5 TAN ST LF (GAUZE/BANDAGES/DRESSINGS) IMPLANT
BNDG GAUZE DERMACEA FLUFF 4 (GAUZE/BANDAGES/DRESSINGS) IMPLANT
CANISTER WOUNDNEG PRESSURE 500 (CANNISTER) IMPLANT
CLEANSER WND VASHE 34 (WOUND CARE) IMPLANT
CLEANSER WND VASHE INSTL 34OZ (WOUND CARE) IMPLANT
COVER SURGICAL LIGHT HANDLE (MISCELLANEOUS) ×2 IMPLANT
DRAPE DERMATAC (DRAPES) IMPLANT
DRAPE U-SHAPE 47X51 STRL (DRAPES) ×1 IMPLANT
DRESSING PREVENA PLUS CUSTOM (GAUZE/BANDAGES/DRESSINGS) IMPLANT
DRESSING VERAFLO CLEANS CC MED (GAUZE/BANDAGES/DRESSINGS) IMPLANT
DRSG ADAPTIC 3X8 NADH LF (GAUZE/BANDAGES/DRESSINGS) ×1 IMPLANT
DRSG EMULSION OIL 3X3 NADH (GAUZE/BANDAGES/DRESSINGS) IMPLANT
DURAPREP 26ML APPLICATOR (WOUND CARE) ×1 IMPLANT
ELECTRODE REM PT RTRN 9FT ADLT (ELECTROSURGICAL) IMPLANT
GAUZE PAD ABD 8X10 STRL (GAUZE/BANDAGES/DRESSINGS) IMPLANT
GAUZE SPONGE 4X4 12PLY STRL (GAUZE/BANDAGES/DRESSINGS) IMPLANT
GLOVE BIOGEL PI IND STRL 9 (GLOVE) ×1 IMPLANT
GLOVE SURG ORTHO 9.0 STRL STRW (GLOVE) ×1 IMPLANT
GOWN STRL REUS W/ TWL XL LVL3 (GOWN DISPOSABLE) ×2 IMPLANT
GRAFT SKIN WND MICRO 38 (Tissue) IMPLANT
KIT BASIN OR (CUSTOM PROCEDURE TRAY) ×1 IMPLANT
KIT TURNOVER KIT B (KITS) ×1 IMPLANT
MANIFOLD NEPTUNE II (INSTRUMENTS) ×1 IMPLANT
NS IRRIG 1000ML POUR BTL (IV SOLUTION) ×1 IMPLANT
PACK ORTHO EXTREMITY (CUSTOM PROCEDURE TRAY) ×1 IMPLANT
PAD ARMBOARD POSITIONER FOAM (MISCELLANEOUS) ×2 IMPLANT
PAD NEG PRESSURE SENSATRAC (MISCELLANEOUS) IMPLANT
SET HNDPC FAN SPRY TIP SCT (DISPOSABLE) IMPLANT
STOCKINETTE IMPERVIOUS 9X36 MD (GAUZE/BANDAGES/DRESSINGS) IMPLANT
SUT ETHILON 2 0 PSLX (SUTURE) ×1 IMPLANT
SWAB COLLECTION DEVICE MRSA (MISCELLANEOUS) ×1 IMPLANT
SWAB CULTURE ESWAB REG 1ML (MISCELLANEOUS) IMPLANT
TOWEL GREEN STERILE (TOWEL DISPOSABLE) ×1 IMPLANT
TUBE CONNECTING 12X1/4 (SUCTIONS) ×1 IMPLANT
YANKAUER SUCT BULB TIP NO VENT (SUCTIONS) ×1 IMPLANT

## 2024-08-04 NOTE — Assessment & Plan Note (Deleted)
 GERD - continue home pantoprazole  HLD / PAD - continue home Lipitor Depression - continue home fluoxetine 

## 2024-08-04 NOTE — Assessment & Plan Note (Addendum)
 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 CT knee 9/6 with soft tissue thickening suspicious for subQ abscess. Remains afebrile, however pain has worsened. Knee aspiration drawn by EDP. Now with increased fluctuance and size on exam, ortho was contacted and aspirated joint 9/11. - Cont Vanc T/Th/Sat w/ HD and change Ceftriaxone  2 g q24 H to ceftazidine on discharge for a total of 14 days of coverage (last dose 9/19). - Knee aspiration culture final read without growth x3 days. Will continue broad abx for full course.  - Stain reveals abundant gram negative rods and gram positive cocci - Ortho consulted and will likely perform I&D today (9/12). - Pain regimen: Oxycodone  2.5-5 mg q4prn, changed Tylenol  from prn to scheduled  - Fall precautions

## 2024-08-04 NOTE — Plan of Care (Signed)
  Problem: Education: Goal: Knowledge of General Education information will improve Description: Including pain rating scale, medication(s)/side effects and non-pharmacologic comfort measures Outcome: Progressing   Problem: Clinical Measurements: Goal: Ability to maintain clinical measurements within normal limits will improve Outcome: Progressing   Problem: Nutrition: Goal: Adequate nutrition will be maintained Outcome: Progressing   Problem: Pain Managment: Goal: General experience of comfort will improve and/or be controlled Outcome: Progressing   Problem: Skin Integrity: Goal: Risk for impaired skin integrity will decrease Outcome: Progressing   Problem: Metabolic: Goal: Ability to maintain appropriate glucose levels will improve Outcome: Progressing

## 2024-08-04 NOTE — Anesthesia Procedure Notes (Signed)
 Procedure Name: LMA Insertion Date/Time: 08/04/2024 5:41 PM  Performed by: Julien Manus, CRNAPre-anesthesia Checklist: Patient identified, Emergency Drugs available, Suction available and Patient being monitored Patient Re-evaluated:Patient Re-evaluated prior to induction Oxygen Delivery Method: Circle System Utilized Preoxygenation: Pre-oxygenation with 100% oxygen Induction Type: IV induction Ventilation: Mask ventilation without difficulty LMA: LMA inserted LMA Size: 5.0 Number of attempts: 1 Airway Equipment and Method: Bite block Placement Confirmation: positive ETCO2 Tube secured with: Tape Dental Injury: Teeth and Oropharynx as per pre-operative assessment

## 2024-08-04 NOTE — Assessment & Plan Note (Addendum)
 Oxygenation and RR stable on room air. No dyspnea noted on exam. CXR 9/11 shows increased bilateral pleural effusions and bibasilar airspace disease and cardiomegaly. Most likely associated with insufficient HD volume given reduction of symptoms after HD. However, Echocardiogram from 05/10/2024 showed mild L ventricle hypertrophy and an EF of 55-60%. Given persistent dyspnea and growing pleural effusion, can consider repeat echo to evaluate for possible HFrEF outpatient. - Continue Lasix  40 mg BID - HD Tu/Th/Sat as scheduled - Home Trelegy, albuterol  prn

## 2024-08-04 NOTE — Consult Note (Signed)
 ORTHOPAEDIC CONSULTATION  REQUESTING PHYSICIAN: Delores Suzann HERO, MD  Chief Complaint: Abscess popliteal fossa right leg  HPI: [name] [name] is a 53 y.o. male who presents with abscess popliteal fossa right leg with a transtibial amputation.  Patient's leg has subsided into the socket causing pressure ulceration and abscess in the popliteal fossa.  Past Medical History:  Diagnosis Date   Abdominal pain 11/30/2023   Abdominal tenderness 10/14/2023   Allergy    seasonal and environmental   Anemia    hx blood transfusion 04/27/24   Chest pain 12/01/2023   Chronic constipation 05/13/2020   Depression    Diabetes mellitus without complication (HCC) 2019   Dyspnea    hx - SOB with exertion   ESRD on hemodialysis (HCC)    Tues, Thurs, Sat   GERD (gastroesophageal reflux disease)    Hyperlipidemia    Hypertension    Hypertensive urgency 01/24/2024   Hypotension 12/01/2023   Necrotizing fasciitis (HCC) 10/13/2017   Neuromuscular disorder (HCC)    neuropathy   PAD (peripheral artery disease) (HCC)    Patellar tendinitis 10/12/2023   Secondary hyperparathyroidism (HCC)    SVT (supraventricular tachycardia) (HCC)    hx   Wound dehiscence 11/24/2017   Past Surgical History:  Procedure Laterality Date   A/V FISTULAGRAM N/A 03/17/2024   Procedure: A/V Fistulagram;  Surgeon: Magda Debby SAILOR, MD;  Location: [facility] INVASIVE CV LAB;  Service: Cardiovascular;  Laterality: N/A;   AMPUTATION Right 10/13/2017   Procedure: RIGHT BELOW KNEE AMPUTATION;  Surgeon: Harden Jerona GAILS, MD;  Location: [facility] OR;  Service: Orthopedics;  Laterality: Right;   AMPUTATION Right 11/24/2017   Procedure: AMPUTATION BELOW KNEE REVISION;  Surgeon: Harden Jerona GAILS, MD;  Location: [facility] OR;  Service: Orthopedics;  Laterality: Right;   AMPUTATION TOE Left    AV FISTULA PLACEMENT Left 10/19/2023   Procedure: LEFT BRACHIOCEPHALIC ARTERIOVENOUS (AV) FISTULA CREATION;  Surgeon: Pearline Norman RAMAN, MD;  Location: [facility] OR;   Service: Vascular;  Laterality: Left;   AV FISTULA PLACEMENT Left 01/26/2024   Procedure: LEFT ARTERIOVENOUS (AV) FISTULA CREATION;  Surgeon: Pearline Norman RAMAN, MD;  Location: [facility] OR;  Service: Vascular;  Laterality: Left;   BASCILIC VEIN TRANSPOSITION Left 04/26/2024   Procedure: LEFT SECOND STAGE TRANSPOSITION, VEIN, BASILIC;  Surgeon: Magda Debby SAILOR, MD;  Location: [facility] OR;  Service: Vascular;  Laterality: Left;   BIOPSY  09/02/2021   Procedure: BIOPSY;  Surgeon: Federico Rosario BROCKS, MD;  Location: [facility] ENDOSCOPY;  Service: Gastroenterology;;   BIOPSY  02/18/2023   Procedure: BIOPSY;  Surgeon: Leigh Elspeth SQUIBB, MD;  Location: [facility] ENDOSCOPY;  Service: Gastroenterology;;   COLONOSCOPY  05/11/2019   COLONOSCOPY  2021   3 polyps - tubular adenomas   ESOPHAGOGASTRODUODENOSCOPY (EGD) WITH PROPOFOL  N/A 09/02/2021   Procedure: ESOPHAGOGASTRODUODENOSCOPY (EGD) WITH PROPOFOL ;  Surgeon: Federico Rosario BROCKS, MD;  Location: [facility] ENDOSCOPY;  Service: Gastroenterology;  Laterality: N/A;   ESOPHAGOGASTRODUODENOSCOPY (EGD) WITH PROPOFOL  N/A 02/18/2023   Procedure: ESOPHAGOGASTRODUODENOSCOPY (EGD) WITH PROPOFOL ;  Surgeon: Leigh Elspeth SQUIBB, MD;  Location: [facility] ENDOSCOPY;  Service: Gastroenterology;  Laterality: N/A;   INCISION AND DRAINAGE OF WOUND Left 05/17/2024   Procedure: IRRIGATION AND DEBRIDEMENT WOUND;  Surgeon: Serene Gaile ORN, MD;  Location: [facility] OR;  Service: Vascular;  Laterality: Left;  Arm   INTRAMEDULLARY (IM) NAIL INTERTROCHANTERIC Right 10/15/2023   Procedure: INTRAMEDULLARY (IM) NAIL INTERTROCHANTERIC;  Surgeon: Kendal Franky SQUIBB, MD;  Location: [facility] OR;  Service: Orthopedics;  Laterality: Right;   IR FLUORO GUIDE  CV LINE RIGHT  10/15/2023   IR TUNNELED CENTRAL VENOUS CATH PLC W IMG  07/31/2024   IR US  GUIDE VASC ACCESS RIGHT  10/15/2023   POLYPECTOMY     WISDOM TOOTH EXTRACTION     Social History   Socioeconomic History   Marital status: Single    Spouse name: Not on file   Number of children: 1   Years  of education: 12   Highest education level: Associate degree: occupational, Scientist, product/process development, or vocational program  Occupational History   Occupation: unemployed  Tobacco Use   Smoking status: Every Day    Current packs/day: 0.50    Average packs/day: 0.5 packs/day for 36.2 years (18.0 ttl pk-yrs)    Types: Cigarettes    Start date: 11/23/1988   Smokeless tobacco: Never   Tobacco comments:    6 cigarettes daily- khj 05/24/2024        Started smoking at 53 years old    Smoked 1PPD at his heaviest  Vaping Use   Vaping status: Never Used  Substance and Sexual Activity   Alcohol use: No   Drug use: No   Sexual activity: Not Currently  Other Topics Concern   Not on file  Social History Narrative   Originally from  [place].   Is living with his mother, who essentially supports him now.   Social Drivers of Corporate investment banker Strain: High Risk (10/30/2023)   Overall Financial Resource Strain (CARDIA)    Difficulty of Paying Living Expenses: Very hard  Food Insecurity: Food Insecurity Present (07/30/2024)   Hunger Vital Sign    Worried About Running Out of Food in the Last Year: Sometimes true    Ran Out of Food in the Last Year: Sometimes true  Transportation Needs: No Transportation Needs (07/30/2024)   PRAPARE - Administrator, Civil Service (Medical): No    Lack of Transportation (Non-Medical): No  Physical Activity: Unknown (10/30/2023)   Exercise Vital Sign    Days of Exercise per Week: Patient declined    Minutes of Exercise per Session: 0 min  Stress: Stress Concern Present (07/17/2024)   [employer] of Occupational Health - Occupational Stress Questionnaire    Feeling of Stress: To some extent  Social Connections: Patient Declined (01/23/2024)   Social Connection and Isolation Panel    Frequency of Communication with Friends and Family: Patient declined    Frequency of Social Gatherings with Friends and Family: Patient declined    Attends Religious  Services: Patient declined    Database administrator or Organizations: Patient declined    Attends Banker Meetings: Patient declined    Marital Status: Patient declined  Recent Concern: Social Connections - Socially Isolated (01/23/2024)   Social Connection and Isolation Panel    Frequency of Communication with Friends and Family: More than three times a week    Frequency of Social Gatherings with Friends and Family: Never    Attends Religious Services: Never    Database administrator or Organizations: No    Attends Engineer, structural: Never    Marital Status: Never married   Family History  Problem Relation Age of Onset   Diabetes Mellitus II Mother    Depression Mother    Colon polyps Mother    Hypertension Mother    High Cholesterol Mother    Heart disease Father        CABG x 4.  04/2017   Diabetes Father  High Cholesterol Father    Hypertension Father    Heart disease Maternal Grandmother    Heart disease Maternal Grandfather    Colon cancer Neg Hx    Esophageal cancer Neg Hx    Liver cancer Neg Hx    Pancreatic cancer Neg Hx    Rectal cancer Neg Hx    Stomach cancer Neg Hx    - negative except otherwise stated in the family history section Allergies  Allergen Reactions   Iron  Nausea And Vomiting    With the iron  tablets   Tape Rash    Rash at site of tape--okay to use paper tape   Prior to Admission medications   Medication Sig Start Date End Date Taking? Authorizing Provider  albuterol  (VENTOLIN  HFA) 108 (90 Base) MCG/ACT inhaler Inhale 2 puffs into the lungs every 6 (six) hours as needed for wheezing or shortness of breath. 06/13/24  Yes Hindel, Leah, MD  atorvastatin  (LIPITOR) 40 MG tablet Take 1 tablet (40 mg total) by mouth daily. 07/13/24  Yes Hindel, Rea, MD  calcitRIOL  (ROCALTROL ) 0.25 MCG capsule Take 1 capsule (0.25 mcg total) by mouth Every Tuesday,Thursday,and Saturday with dialysis. 10/26/23  Yes Baloch, Mahnoor, MD  Calcium   Acetate 667 MG TABS Take 1,334 mg by mouth 3 (three) times daily. Patient taking differently: Take 1,334 mg by mouth 2 (two) times daily.   Yes [provider]  carvedilol  (COREG ) 12.5 MG tablet Take 1 tablet (12.5 mg total) by mouth 2 (two) times daily with a meal. 07/07/24  Yes Hindel, Leah, MD  diltiazem  (CARDIZEM  CD) 300 MG 24 hr capsule Take 1 capsule (300 mg total) by mouth daily. 07/01/24  Yes Hindel, Leah, MD  FLUoxetine  (PROZAC ) 40 MG capsule Take 1 capsule (40 mg total) by mouth daily. 06/28/24  Yes Lorrane Pac, MD  Fluticasone -Umeclidin-Vilant (TRELEGY ELLIPTA ) 100-62.5-25 MCG/ACT AEPB INHALE 1 PUFF INTO LUNGS DAILY 05/15/24  Yes Christia Budds, MD  furosemide  (LASIX ) 40 MG tablet Take 40 mg by mouth 2 (two) times daily.   Yes [provider]  hydrALAZINE  (APRESOLINE ) 100 MG tablet Take 2 tablets (200 mg total) by mouth 2 (two) times daily. 07/14/24  Yes Baker, Raguel MATSU, DO  insulin  lispro (HUMALOG ) 100 UNIT/ML injection 0-6 Units, Subcutaneous, 3 times daily with meals, First dose on Sun 10/17/23 at 0800 Correction coverage: Very Sensitive (ESRD/Dialysis) CBG < 70: Implement Hypoglycemia Standing Orders and refer to Hypoglycemia Standing Orders sidebar report CBG 70 - 120: 0 units CBG 121 - 150: 0 units CBG 151 - 200: 1 unit CBG 201-250: 2 units CBG 251-300: 3 units CBG 301-350: 4 units CBG 351-400: 5 units CBG > 400: Give 6 units and call MD 10/26/23  Yes Baloch, Mahnoor, MD  loratadine  (CLARITIN ) 10 MG tablet Take 1 tablet (10 mg total) by mouth daily. 05/24/24 05/24/25 Yes Dgayli, Belva, MD  losartan  (COZAAR ) 25 MG tablet Take 1 tablet (25 mg total) by mouth daily. 07/27/24  Yes Hindel, Leah, MD  methocarbamol  (ROBAXIN ) 750 MG tablet Take 1 tablet (750 mg total) by mouth every 8 (eight) hours as needed for muscle spasms. 07/14/24  Yes Baker, Amelia G, DO  ondansetron  (ZOFRAN -ODT) 4 MG disintegrating tablet Take 1 tablet (4 mg total) by mouth every 8 (eight) hours as  needed for nausea or vomiting. 12/12/23  Yes Christia Budds, MD  pantoprazole  (PROTONIX ) 40 MG tablet TAKE 1 TABLET(40 MG) BY MOUTH DAILY 05/11/24  Yes Christia Budds, MD  tamsulosin  (FLOMAX ) 0.4 MG CAPS capsule Take 1  capsule (0.4 mg total) by mouth daily. 07/27/24  Yes Hindel, Leah, MD  Accu-Chek Softclix Lancets lancets Use to check blood sugar twice per day. E11.9 03/15/24   Christia Budds, MD  Blood Glucose Monitoring Suppl (ACCU-CHEK GUIDE ME) w/Device KIT See admin instructions. 02/23/23   [provider]  fluticasone  (FLONASE ) 50 MCG/ACT nasal spray Place 2 sprays into both nostrils daily. Patient taking differently: Place 2 sprays into both nostrils daily as needed for allergies. 05/11/24   Christia Budds, MD  gabapentin  (NEURONTIN ) 300 MG capsule TAKE 1 CAPSULE(300 MG) BY MOUTH DAILY 07/31/24   Hindel, Rea, MD  glucose blood (AGAMATRIX PRESTO TEST) test strip Check blood sugars twice daily before meals 02/02/18   Adella Norris, MD  glucose blood test strip 1 each by Other route as needed for other. Use as instructed    [provider]  nicotine  (NICODERM CQ  - DOSED IN MG/24 HOURS) 14 mg/24hr patch Place 1 patch (14 mg total) onto the skin daily. Patient not taking: No sig reported 06/12/24   Barbarann Nest, MD   DG CHEST PORT 1 VIEW Result Date: 08/03/2024 CLINICAL DATA:  Dyspnea EXAM: PORTABLE CHEST 1 VIEW COMPARISON:  07/29/2024 FINDINGS: Right-sided central venous catheter tip at the cavoatrial region. Cardiomegaly with vascular congestion and pulmonary edema. Increased bilateral pleural effusions and bibasilar airspace disease. Bandlike atelectasis in the left perihilar region. No pneumothorax IMPRESSION: Cardiomegaly with vascular congestion and pulmonary edema. Increased bilateral pleural effusions and bibasilar airspace disease. Electronically Signed   By: Luke Bun M.D.   On: 08/03/2024 16:47   - pertinent xrays, CT, MRI studies were reviewed and  independently interpreted  Positive ROS: All other systems have been reviewed and were otherwise negative with the exception of those mentioned in the HPI and as above.  Physical Exam: General: Alert, no acute distress Psychiatric: Patient is competent for consent with normal mood and affect Lymphatic: No axillary or cervical lymphadenopathy Cardiovascular: No pedal edema Respiratory: No cyanosis, no use of accessory musculature GI: No organomegaly, abdomen is soft and non-tender    Images:  @ENCIMAGES @  Labs:  Lab Results  Component Value Date   HGBA1C 7.3 (H) 06/09/2024   HGBA1C 6.1 (H) 10/13/2023   HGBA1C 6.6 05/18/2023   ESRSEDRATE 68 (H) 04/10/2021   ESRSEDRATE 75 (H) 10/20/2017   CRP 1.9 (H) 10/20/2017   REPTSTATUS 08/01/2024 FINAL 07/29/2024   GRAMSTAIN  07/29/2024    ABUNDANT WBC PRESENT, PREDOMINANTLY PMN ABUNDANT GRAM NEGATIVE RODS ABUNDANT GRAM POSITIVE COCCI    CULT  07/29/2024    NO GROWTH 3 DAYS Performed at [facility], [address] [place], [place] [address]    [facility] ENTEROCOCCUS MARIJO 05/17/2024    Lab Results  Component Value Date   ALBUMIN  2.6 (L) 08/03/2024   ALBUMIN  2.5 (L) 08/02/2024   ALBUMIN  2.6 (L) 08/01/2024        Latest Ref Rng & Units 08/04/2024    5:06 AM 08/03/2024   11:46 AM 08/02/2024    5:00 AM  CBC EXTENDED  WBC 4.0 - 10.5 K/uL 6.0  5.6  6.2   RBC 4.22 - 5.81 MIL/uL 3.79  3.31  3.49   Hemoglobin 13.0 - 17.0 g/dL 8.2  7.2  7.5   HCT 60.9 - 52.0 % 28.0  24.9  25.6   Platelets 150 - 400 K/uL 158  117  125     Neurologic: Patient does not have protective sensation bilateral lower extremities.   MUSCULOSKELETAL:  Skin: Examination there is a swollen abscess medial aspect of the popliteal fossa secondary to subsidence into the prosthesis.  There is no drainage there is tenderness to palpation.  Hemoglobin 8.2 with a white cell count of 6.0.  Assessment: Assessment abscess popliteal fossa right  transtibial amputation secondary to subsidence into the socket.  Plan: Plan: Will plan for excision of the abscess.  Discussed that we will need to revise the socket to prevent pressure in this area to allow for this to heal and prevent recurrence.  Risks and benefits were discussed including risk of the wound not healing need for additional surgery.  Patient states he understands wished to proceed at this time.  Thank you for the consult and the opportunity to see Mr. Larue Lightner, MD [facility] [phone] 1:38 PM

## 2024-08-04 NOTE — Op Note (Signed)
 08/04/2024  6:14 PM  PATIENT:  [name] [name]    PRE-OPERATIVE DIAGNOSIS:  Abscess Right Popliteal Fossa  POST-OPERATIVE DIAGNOSIS:  Same  PROCEDURE: Excisional debridement abscess popliteal fossa right knee with excision skin soft tissue muscle fascia and pseudocyst.  Application Kerecis micro graft 38 cm to cover wound surface area 50 cm. Local tissue transfer to close the wound 5 x 10 cm.   SURGEON:  Jerona LULLA Sage, MD  PHYSICIAN ASSISTANT:None ANESTHESIA:   General  PREOPERATIVE INDICATIONS:  DETRICH RAKESTRAW is a  53 y.o. male with a diagnosis of Abscess Right Popliteal Fossa who failed conservative measures and elected for surgical management.    The risks benefits and alternatives were discussed with the patient preoperatively including but not limited to the risks of infection, bleeding, nerve injury, cardiopulmonary complications, the need for revision surgery, among others, and the patient was willing to proceed.  OPERATIVE IMPLANTS:   Implant Name Type Inv. Item Serial No. Manufacturer Lot No. LRB No. Used Action  GRAFT SKIN WND MICRO 38 - [id number] Tissue GRAFT SKIN WND MICRO 38  [employer] [id number] Right 1 Implanted    @ENCIMAGES @  OPERATIVE FINDINGS: Patient had a large pseudocyst with infected hematoma.  The cyst and pseudocapsule were excised.  Tissue margins were clear.  OPERATIVE PROCEDURE: Patient brought the operating room and underwent a general anesthetic.  After adequate levels anesthesia obtained patient's right lower extremity was prepped using DuraPrep draped into a sterile field a timeout was called.  Elliptical incision was made around the cystic mass in the popliteal fossa.  This left the wound that was 10 x 5 cm.  The cystic mass the pseudocyst wall and the infected hematoma were resected in 1 block of tissue.  Electrocautery was used hemostasis the wound was cleansed with Vashe.  The tissue margins were clear.  The wound was filled with 38  cm of Kerecis micro graft to cover a wound surface area greater than 50 cm.  The tissue margins were undermined and local tissue transfer was used to close the wound 5 x 10 cm.  Wound closed with 2-0 nylon.  Sterile dressing was applied patient was extubated taken the PACU in stable condition   DISCHARGE PLANNING:  Antibiotic duration: 24 hours  Weightbearing: Patient needs to wait on using the prosthesis until this incision heals  Pain medication: Opioid pathway  Dressing care/ Wound VAC: Dry dressing  Ambulatory devices: Crutches or walker  Discharge to: Anticipate discharge to home.  Follow-up: In the office 1 week post operative.

## 2024-08-04 NOTE — Assessment & Plan Note (Deleted)
 Pulse rates remain stable.  - continue Diltiazem , Lasix , and Coreg 

## 2024-08-04 NOTE — Assessment & Plan Note (Signed)
 HD scheduled for Saturday 9/13.  - Appreciate Nephrology recs - AM RFP Tu/Th/Sat

## 2024-08-04 NOTE — Assessment & Plan Note (Signed)
 Range between 162/89-182/84. Worse control compared to yesterday. Will continue to monitor. No bradycardia after restarting carvedilol . Only got 1 dose yesterday likely 2/2 late HD. - Continue home hydralazine  and losartan   - Continue carvedilol  12.5 mg BID given elevated BP

## 2024-08-04 NOTE — Progress Notes (Addendum)
 Daily Progress Note Intern Pager: [phone]  Patient name: [name] [name] Medical record number: [id number] Date of birth: [birth year] Age: 53 y.o. Gender: male  Primary Care Provider: Adele Song, MD Consultants: Nephrology, Orthopedic surgery  Code Status: Full  Pt Overview and Major Events to Date:  9/6: Admitted to FMTS 9/8: HD Catheter exchanged by IR 9/12: I&D performed by ortho  Medical Decision Making:  [name] [name] is a 53 y.o. male with a PMH of T2DM, ESRD on HD, HTN, R BKA (2018) admitted for R knee abscess. Receiving Broad IV abx with no growth on aspiration culture. Orthopedic surgery following.  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 CT knee 9/6 with soft tissue thickening suspicious for subQ abscess. Remains afebrile, however pain has worsened. Knee aspiration drawn by EDP. Now with increased fluctuance and size on exam, ortho was contacted and aspirated joint 9/11. - Cont Vanc T/Th/Sat w/ HD and change Ceftriaxone  2 g q24 H to ceftazidine on discharge for a total of 14 days of coverage (last dose 9/19). - Knee aspiration culture final read without growth x3 days. Will continue broad abx for full course.  - Stain reveals abundant gram negative rods and gram positive cocci - Ortho consulted and will likely perform I&D today (9/12). - Pain regimen: Oxycodone  2.5-5 mg q4prn, changed Tylenol  from prn to scheduled  - Fall precautions  Pleural effusion Oxygenation and RR stable on room air. No dyspnea noted on exam. CXR 9/11 shows increased bilateral pleural effusions and bibasilar airspace disease and cardiomegaly. Most likely associated with insufficient HD volume given reduction of symptoms after HD. However, Echocardiogram from 05/10/2024 showed mild L ventricle hypertrophy and an EF of 55-60%. Given persistent dyspnea and growing pleural effusion, can consider repeat echo to evaluate for  possible HFrEF outpatient. - Continue Lasix  40 mg BID - HD Tu/Th/Sat as scheduled - Home Trelegy, albuterol  prn ESRD on hemodialysis [facility]) Mechanical complication of dialysis catheter [facility]) HD scheduled for Saturday 9/13.  - Appreciate Nephrology recs - AM RFP Tu/Th/Sat Uncontrolled type 2 diabetes mellitus with hyperglycemia, with long-term current use of insulin  (HCC) Blood glucose levels improved. A1c 1 month ago 7.3.  BG levels improved. - Continue Lantus  5u daily w/ very sensitive SSI  Severe hypertension Range between 162/89-182/84. Worse control compared to yesterday. Will continue to monitor. No bradycardia after restarting carvedilol . Only got 1 dose yesterday likely 2/2 late HD. - Continue home hydralazine  and losartan   - Continue carvedilol  12.5 mg BID given elevated BP  Anemia of chronic disease Hgb rebounded from 7.2> 8.2 this morning. Suspect secondary to chronic disease. Per chart review, unclear documentation of CAD, appears unlikely given Cardiology office notes and no cath reports.  - AM CBC - Transfusion threshold <7 unless symptomatic, patient was offered a transfusion yesterday d/t having some SOB (likely symptomatic anemia), but would like to hold off on any transfusions at this time.  Chronic health problem GERD - continue home pantoprazole  HLD / PAD - continue home Lipitor Depression - continue home fluoxetine   FEN/GI: Carb Modified PPx: heparin  after procedure Dispo:Home after ortho medically clears.   Subjective:  Pt states that he is doing ok. States that he has had continued 9/10 knee pain despite taking his oxycodone  every 4 hours. Looking at the chart, the patient got his oxycodone  at 6 hour intervals. Pt states his breathing is unchanged with only mild shortness of breath at  rest and worsened with light activity.   Objective: Temp:  [97.6 F (36.4 C)-98.6 F (37 C)] 98 F (36.7 C) (09/12 0751) Pulse Rate:  [66-88] 76 (09/12 0751) Resp:  [6-22] 19  (09/12 0751) BP: (157-187)/(83-98) 165/88 (09/12 0751) SpO2:  [94 %-100 %] 98 % (09/12 0751) Weight:  [107.5 kg-111 kg] 107.5 kg (09/11 1459) Physical Exam: General: NAD. Nontoxic appearing Cardiovascular: regular rate and rhythm Respiratory: lungs clear to auscultation without wheezing or crackles. Diminished breath sounds in LLL Abdomen: soft, nontender, nondistended. Extremities: abscess to medial aspect of   Laboratory: Most recent CBC Lab Results  Component Value Date   WBC 6.0 08/04/2024   HGB 8.2 (L) 08/04/2024   HCT 28.0 (L) 08/04/2024   MCV 73.9 (L) 08/04/2024   PLT 158 08/04/2024   Most recent BMP    Latest Ref Rng & Units 08/04/2024    5:06 AM  BMP  Glucose 70 - 99 mg/dL 815   BUN 6 - 20 mg/dL 38   Creatinine 9.38 - 1.24 mg/dL 3.76   Sodium 864 - 854 mmol/L 131   Potassium 3.5 - 5.1 mmol/L 5.0   Chloride 98 - 111 mmol/L 93   CO2 22 - 32 mmol/L 25   Calcium  8.9 - 10.3 mg/dL 8.1     Imaging/Diagnostic Tests: Radiologist Impression:  PORTABLE CHEST 1 VIEW 08/03/2024 IMPRESSION: Cardiomegaly with vascular congestion and pulmonary edema. Increased bilateral pleural effusions and bibasilar airspace disease.   Benjamine [name], Medical Student 08/04/2024, 7:53 AM  MS4 AI, [facility] FPTS Intern pager: [phone], text pages welcome Secure chat group [facility] Teaching Service   I agree with the assessment and plan as documented above.  Stuart Redo, MD PGY-3, [facility]

## 2024-08-04 NOTE — Transfer of Care (Signed)
 Immediate Anesthesia Transfer of Care Note  Patient: [name] [name]  Procedure(s) Performed: IRRIGATION AND DEBRIDEMENT KNEE, RIGHT (Right: Knee)  Patient Location: PACU  Anesthesia Type:General  Level of Consciousness: drowsy  Airway & Oxygen Therapy: Patient Spontanous Breathing and Patient connected to face mask oxygen  Post-op Assessment: Report given to RN and Post -op Vital signs reviewed and stable  Post vital signs: Reviewed and stable  Last Vitals:  Vitals Value Taken Time  BP 123/64 08/04/24 18:15  Temp    Pulse 59 08/04/24 18:18  Resp 9 08/04/24 18:18  SpO2 98 % 08/04/24 18:18  Vitals shown include unfiled device data.  Last Pain:  Vitals:   08/04/24 1436  TempSrc: Oral  PainSc:          Complications: No notable events documented.

## 2024-08-04 NOTE — Progress Notes (Signed)
 [facility] Progress Note   Subjective:   Seen in room.  UF 3.5L yesterday on HD.  OR debridement of abscess today with Dr. Harden per notes.   Objective Vitals:   08/03/24 1500 08/03/24 2020 08/04/24 0411 08/04/24 0751  BP:  (!) 171/86 (!) 168/85 (!) 165/88  Pulse: 85 88 84 76  Resp: 13 15 16 19   Temp:  98 F (36.7 C) 98.4 F (36.9 C) 98 F (36.7 C)  TempSrc:  Oral Oral Oral  SpO2: 95% 97% 98% 98%  Weight:      Height:       Physical Exam General:  Chronically ill appearing man, NAD Heart: RRR; no murmur Lungs: CTA anteriorly Abdomen: soft Extremities: R BKA, no LLE edema - skin shriveled Dialysis Access: TDC in R chest, LUE AVF +t/b - very small dried blood on dressing - looks good  Additional Objective Labs: Basic Metabolic Panel: Recent Labs  Lab 08/01/24 0824 08/02/24 0500 08/03/24 1145 08/04/24 0506  NA 131* 133* 129* 131*  K 5.6* 4.4 5.4* 5.0  CL 97* 95* 94* 93*  CO2 20* 24 22 25   GLUCOSE 227* 236* 230* 184*  BUN 61* 45* 61* 38*  CREATININE 9.04* 6.96* 8.58* 6.23*  CALCIUM  7.8* 7.7* 7.9* 8.1*  PHOS 6.5* 3.8 5.0*  --    Liver Function Tests: Recent Labs  Lab 08/01/24 0824 08/02/24 0500 08/03/24 1145  ALBUMIN  2.6* 2.5* 2.6*   CBC: Recent Labs  Lab 07/29/24 1429 07/30/24 0448 08/01/24 0824 08/02/24 0500 08/03/24 1146 08/04/24 0506  WBC 8.6 6.6 8.1 6.2 5.6 6.0  NEUTROABS 7.1  --   --   --   --   --   HGB 7.8* 7.8* 7.6* 7.5* 7.2* 8.2*  HCT 27.3* 27.3* 26.8* 25.6* 24.9* 28.0*  MCV 76.0* 75.8* 76.1* 73.4* 75.2* 73.9*  PLT 111* 118* 109* 125* 117* 158   Blood Culture    Component Value Date/Time   SDES KNEE 07/29/2024 1414   SPECREQUEST ABSCESS POSTERIOR KNEE 07/29/2024 1414   CULT  07/29/2024 1414    NO GROWTH 3 DAYS Performed at [facility], [address] [place], [place] [address]    REPTSTATUS 08/01/2024 FINAL 07/29/2024 1414    Studies/Results: DG CHEST PORT 1 VIEW Result Date: 08/03/2024 CLINICAL DATA:   Dyspnea EXAM: PORTABLE CHEST 1 VIEW COMPARISON:  07/29/2024 FINDINGS: Right-sided central venous catheter tip at the cavoatrial region. Cardiomegaly with vascular congestion and pulmonary edema. Increased bilateral pleural effusions and bibasilar airspace disease. Bandlike atelectasis in the left perihilar region. No pneumothorax IMPRESSION: Cardiomegaly with vascular congestion and pulmonary edema. Increased bilateral pleural effusions and bibasilar airspace disease. Electronically Signed   By: Luke Bun M.D.   On: 08/03/2024 16:47    Medications:   ceFAZolin  (ANCEF ) IV     cefTRIAXone  (ROCEPHIN )  IV Stopped (08/03/24 2230)   vancomycin  Stopped (08/03/24 1503)    atorvastatin   40 mg Oral Daily   budesonide -glycopyrrolate -formoterol   2 puff Inhalation BID   carvedilol   12.5 mg Oral BID WC   chlorhexidine   60 mL Topical Once   Chlorhexidine  Gluconate Cloth  6 each Topical Q0600   diltiazem   300 mg Oral Daily   FLUoxetine   40 mg Oral Daily   furosemide   40 mg Oral BID   gabapentin   300 mg Oral Daily   heparin   5,000 Units Subcutaneous Q8H   hydrALAZINE   200 mg Oral BID   insulin  aspart  0-6 Units Subcutaneous TID WC  insulin  glargine  5 Units Subcutaneous Daily   losartan   25 mg Oral Daily   pantoprazole   40 mg Oral Daily   povidone-iodine   2 Application Topical Once   sodium chloride  flush  10-40 mL Intracatheter Q12H   tamsulosin   0.4 mg Oral Daily   Dialysis Orders TTS - [facility] 4:3min, BFR 400, EDW 100.4kg, 2K bath, TDC + AVF, heparin  2000 - last HD 9/6, post-HD weight 101.6kg - Mircera 225mcg IV q 2 weeks (last 9/6)  Assessment/Plan: R knee abscess: Cx NGTD, on Vanc/ceftriaxone  here HD access: TDC with limb crack, s/p exchange with IR 9/8, has developing LUE AVF +t/b ESRD: Continue HD on TTS schedule - HD next tomorrow   HTN/volume: BP can be very labile - looking acceptable in the past 12h, no changes to meds at this time.  Anemia of ESRD: Hgb 7s - not due for ESA yet; no  IV iron  in setting of serious infection. Secondary HPTH: Ca/Phos ok - continue home meds. T2DM  Ok for d/c from nephrology perspective when medically ready.  Manuelita Barters MD [facility] Pager [phone]

## 2024-08-04 NOTE — Anesthesia Preprocedure Evaluation (Addendum)
 Anesthesia Evaluation  Patient identified by MRN, date of birth, ID band Patient awake    Reviewed: Allergy & Precautions, NPO status , Patient's Chart, lab work & pertinent test results  History of Anesthesia Complications Negative for: history of anesthetic complications  Airway Mallampati: II  TM Distance: >3 FB Neck ROM: Full    Dental  (+) Dental Advisory Given   Pulmonary neg shortness of breath, neg sleep apnea, neg COPD, neg recent URI, Current Smoker   Pulmonary exam normal breath sounds clear to auscultation       Cardiovascular hypertension, Pt. on home beta blockers and Pt. on medications (-) angina + Peripheral Vascular Disease  (-) Past MI, (-) Cardiac Stents and (-) CABG + dysrhythmias Supra Ventricular Tachycardia + Valvular Problems/Murmurs MR  Rhythm:Regular Rate:Normal  HLD  TTE 05/10/2024: IMPRESSIONS    1. Left ventricular ejection fraction, by estimation, is 55 to 60%. The  left ventricle has normal function. The left ventricle has no regional  wall motion abnormalities. There is mild asymmetric left ventricular  hypertrophy. Left ventricular diastolic  parameters were normal.   2. Right ventricular systolic function is normal. The right ventricular  size is normal.   3. Left atrial size was mildly dilated.   4. Right atrial size was mildly dilated.   5. Mild mitral valve regurgitation.   6. The aortic valve is tricuspid. Aortic valve regurgitation is not  visualized. Aortic valve sclerosis/calcification is present, without any  evidence of aortic stenosis.   7. The inferior vena cava is normal in size with greater than 50%  respiratory variability, suggesting right atrial pressure of 3 mmHg.     Neuro/Psych neg Seizures PSYCHIATRIC DISORDERS  Depression     Neuromuscular disease (neuropathy)    GI/Hepatic Neg liver ROS,GERD  Medicated,,  Endo/Other  diabetes, Type 2, Insulin  Dependent  Secondary  hyperparathyroidism  Renal/GU ESRF and DialysisRenal disease (last HD yesterday)     Musculoskeletal  (+) Arthritis ,    Abdominal   Peds  Hematology  (+) Blood dyscrasia, anemia Lab Results      Component                Value               Date                      WBC                      6.0                 08/04/2024                HGB                      8.2 (L)             08/04/2024                HCT                      28.0 (L)            08/04/2024                MCV                      73.9 (L)  08/04/2024                PLT                      158                 08/04/2024              Anesthesia Other Findings   Reproductive/Obstetrics                              Anesthesia Physical Anesthesia Plan  ASA: 3  Anesthesia Plan: General   Post-op Pain Management: Tylenol  PO (pre-op )*   Induction: Intravenous  PONV Risk Score and Plan: 1 and Ondansetron , Dexamethasone , Midazolam  and Treatment may vary due to age or medical condition  Airway Management Planned: LMA  Additional Equipment:   Intra-op Plan:   Post-operative Plan: Extubation in OR  Informed Consent: I have reviewed the patients History and Physical, chart, labs and discussed the procedure including the risks, benefits and alternatives for the proposed anesthesia with the patient or authorized representative who has indicated his/her understanding and acceptance.     Dental advisory given  Plan Discussed with: CRNA and Anesthesiologist  Anesthesia Plan Comments: (Risks of general anesthesia discussed including, but not limited to, sore throat, hoarse voice, chipped/damaged teeth, injury to vocal cords, nausea and vomiting, allergic reactions, lung infection, heart attack, stroke, and death. All questions answered. )         Anesthesia Quick Evaluation

## 2024-08-04 NOTE — Assessment & Plan Note (Signed)
 Blood glucose levels improved. A1c 1 month ago 7.3.  BG levels improved. - Continue Lantus  5u daily w/ very sensitive SSI

## 2024-08-04 NOTE — Assessment & Plan Note (Signed)
 Hgb rebounded from 7.2> 8.2 this morning. Suspect secondary to chronic disease. Per chart review, unclear documentation of CAD, appears unlikely given Cardiology office notes and no cath reports.  - AM CBC - Transfusion threshold <7 unless symptomatic, patient was offered a transfusion yesterday d/t having some SOB (likely symptomatic anemia), but would like to hold off on any transfusions at this time.

## 2024-08-05 DIAGNOSIS — L02415 Cutaneous abscess of right lower limb: Secondary | ICD-10-CM | POA: Diagnosis not present

## 2024-08-05 LAB — CBC
HCT: 27.5 % — ABNORMAL LOW (ref 39.0–52.0)
Hemoglobin: 8.1 g/dL — ABNORMAL LOW (ref 13.0–17.0)
MCH: 21.7 pg — ABNORMAL LOW (ref 26.0–34.0)
MCHC: 29.5 g/dL — ABNORMAL LOW (ref 30.0–36.0)
MCV: 73.7 fL — ABNORMAL LOW (ref 80.0–100.0)
Platelets: 148 K/uL — ABNORMAL LOW (ref 150–400)
RBC: 3.73 MIL/uL — ABNORMAL LOW (ref 4.22–5.81)
RDW: 19.7 % — ABNORMAL HIGH (ref 11.5–15.5)
WBC: 6.1 K/uL (ref 4.0–10.5)
nRBC: 0 % (ref 0.0–0.2)

## 2024-08-05 LAB — RENAL FUNCTION PANEL
Albumin: 2.7 g/dL — ABNORMAL LOW (ref 3.5–5.0)
Anion gap: 13 (ref 5–15)
BUN: 49 mg/dL — ABNORMAL HIGH (ref 6–20)
CO2: 22 mmol/L (ref 22–32)
Calcium: 8.2 mg/dL — ABNORMAL LOW (ref 8.9–10.3)
Chloride: 92 mmol/L — ABNORMAL LOW (ref 98–111)
Creatinine, Ser: 7.46 mg/dL — ABNORMAL HIGH (ref 0.61–1.24)
GFR, Estimated: 8 mL/min — ABNORMAL LOW (ref >60)
Glucose, Bld: 393 mg/dL — ABNORMAL HIGH (ref 70–99)
Phosphorus: 5.1 mg/dL — ABNORMAL HIGH (ref 2.5–4.6)
Potassium: 6.1 mmol/L — ABNORMAL HIGH (ref 3.5–5.1)
Sodium: 127 mmol/L — ABNORMAL LOW (ref 135–145)

## 2024-08-05 LAB — GLUCOSE, CAPILLARY
Glucose-Capillary: 402 mg/dL — ABNORMAL HIGH (ref 70–99)
Glucose-Capillary: 279 mg/dL — ABNORMAL HIGH (ref 70–99)
Glucose-Capillary: 328 mg/dL — ABNORMAL HIGH (ref 70–99)
Glucose-Capillary: 399 mg/dL — ABNORMAL HIGH (ref 70–99)

## 2024-08-05 LAB — VANCOMYCIN, RANDOM: Vancomycin Rm: 22 ug/mL

## 2024-08-05 MED ORDER — SODIUM ZIRCONIUM CYCLOSILICATE 10 G PO PACK
10.0000 g | PACK | Freq: Once | ORAL | Status: AC
Start: 2024-08-05 — End: 2024-08-05
  Administered 2024-08-05: 10 g via ORAL
  Filled 2024-08-05: qty 1

## 2024-08-05 MED ORDER — OXYCODONE HCL 5 MG PO TABS
7.5000 mg | ORAL_TABLET | ORAL | Status: DC | PRN
  Administered 2024-08-06 – 2024-08-08 (×8): 7.5 mg via ORAL
  Filled 2024-08-05 (×8): qty 2

## 2024-08-05 MED ORDER — HEPARIN SODIUM (PORCINE) 1000 UNIT/ML IJ SOLN
INTRAMUSCULAR | Status: AC
Start: 2024-08-05 — End: 2024-08-05
  Filled 2024-08-05: qty 4

## 2024-08-05 MED ORDER — OXYCODONE HCL 5 MG PO TABS
5.0000 mg | ORAL_TABLET | ORAL | Status: DC | PRN
  Administered 2024-08-05 – 2024-08-07 (×5): 5 mg via ORAL
  Filled 2024-08-05 (×5): qty 1

## 2024-08-05 MED ORDER — INSULIN GLARGINE 100 UNIT/ML ~~LOC~~ SOLN
8.0000 [IU] | Freq: Every day | SUBCUTANEOUS | Status: DC
Start: 2024-08-05 — End: 2024-08-06
  Administered 2024-08-05 – 2024-08-06 (×2): 8 [IU] via SUBCUTANEOUS
  Filled 2024-08-05 (×2): qty 0.08

## 2024-08-05 NOTE — Progress Notes (Signed)
   08/05/24 1241  Vitals  Temp 98 F (36.7 C)  Pulse Rate 98  Resp 19  BP (!) 177/79  SpO2 97 %  O2 Device Room Air  Weight 103.9 kg (Simultaneous filing. User may not have seen previous data.)  Type of Weight Post-Dialysis  Oxygen Therapy  Patient Activity (if Appropriate) In bed  Pulse Oximetry Type Continuous  Oximetry Probe Site Changed No  Post Treatment  Dialyzer Clearance Lightly streaked  Hemodialysis Intake (mL) 0 mL  Liters Processed 63 (Simultaneous filing. User may not have seen previous data.)  Fluid Removed (mL) 3000 mL (Simultaneous filing. User may not have seen previous data.)  Tolerated HD Treatment Yes   Received patient in bed to unit.  Alert and oriented.  Informed consent signed and in chart.   TX duration:3.0 todaY per dx order  Patient tolerated well.  Transported back to the room  Alert, without acute distress.  Hand-off given to patient's nurse.   Access used: RIJ DLC Access issues: no complications  Total UF removed: 3000 Medication(s) given: none    Delon LITTIE [facility]

## 2024-08-05 NOTE — Assessment & Plan Note (Signed)
 POD2 excision of encapsulated hematoma abscess popliteal fossa R knee by Dr. Harden.  - Continue Vancomycin  T/Th/S with HD (9/9-) - Continue Ceftriaxone , transition to Ceftazidine at discharge (9/6-9/19) - Tylenol  1000mg  q6h, Oxycodone  5-7.5mg  q4h PRN - NWB on R stump - no prostheses for two weeks - PT/OT eval - pt unable to safely go home without prosthesis - Fall precautions

## 2024-08-05 NOTE — Assessment & Plan Note (Addendum)
 Hgb stable from 8.2> 8.1 this morning. Suspect secondary to chronic disease. Per chart review, unclear documentation of CAD, appears unlikely given cardiology office notes and no cath reports.  - AM CBC - Transfusion threshold <7 unless symptomatic

## 2024-08-05 NOTE — Progress Notes (Signed)
 OT Cancellation Note  Patient Details Name: [name] [name] MRN: [id number] DOB: May 26, 1971   Cancelled Treatment:    Reason Eval/Treat Not Completed: Patient declined, no reason specified. Pt declining evaluation, reporting he does not need therapy and cannot move without his prosthesis. Re-oriented to role of therapies and pt continues to refuse. Will follow up as able  Kierstan Auer D Walton, OTD, OTR/L [facility] Acute Rehabilitation Office: [phone]   [name] [name] 08/05/2024, 3:33 PM

## 2024-08-05 NOTE — Progress Notes (Signed)
 Pharmacy Antibiotic Note  [name] [name] is a 53 y.o. male admitted on 07/29/2024 with R knee abscess.  Pharmacy has been consulted for vancomycin  dosing; also on ceftriaxone .  Vancomycin  random level 22 - > therapeutic   Plan: Continue Vancomycin  1000 mg IV after HD on TTS Continue Ceftriaxone  2 grams iv Q 24 hours Planning 2 weeks of antibiotics at HD - Vancomycin  and Ceftazidime   Height: 6' 5 (195.6 cm) Weight: 106.6 kg (235 lb) IBW/kg (Calculated) : 89.1  Temp (24hrs), Avg:98.2 F (36.8 C), Min:97.9 F (36.6 C), Max:98.4 F (36.9 C)  Recent Labs  Lab 08/01/24 0824 08/02/24 0500 08/03/24 1145 08/03/24 1146 08/04/24 0506 08/05/24 0434  WBC 8.1 6.2  --  5.6 6.0 6.1  CREATININE 9.04* 6.96* 8.58*  --  6.23* 7.46*  VANCORANDOM  --   --   --   --   --  22    Estimated Creatinine Clearance: 14.8 mL/min (A) (by C-G formula based on SCr of 7.46 mg/dL (H)).    Allergies  Allergen Reactions   Iron  Nausea And Vomiting    With the iron  tablets   Tape Rash    Rash at site of tape--okay to use paper tape    Thank you. Olam Monte, PharmD 08/05/2024 9:22 AM

## 2024-08-05 NOTE — Plan of Care (Signed)
 FMTS Interim Progress Note  Patient assessed at bedside after right abscess excision with orthopedics.  Reports he is having some pain, has yet to take off his right prosthetic.  Denies any nausea or vomiting.  O: BP (!) 169/90 (BP Location: Right Arm)   Pulse 86   Temp 98.4 F (36.9 C) (Oral)   Resp 17   Ht 6' 5 (1.956 m)   Wt 106.6 kg   SpO2 95%   BMI 27.87 kg/m    General: Sitting up in bed, NAD MSK: Right prosthesis in place, removed.  Right stump in knee covered in clean and dry [place] without surrounding drainage or erythema.  Movement and sensation intact.  A/P: S/p right BKA abscess excision No postoperative complications, doing well and will advance diet.  Agree with current pain regimen, will continue to monitor pain postoperatively.  Theophilus Pagan, MD 08/05/2024, 6:16 AM PGY-3, [facility] Family Medicine Service pager [phone]

## 2024-08-05 NOTE — Progress Notes (Signed)
 Patient ID: [name] [name], male   DOB: [birth year], 53 y.o.   MRN: [id number] Patient is postoperative day 1 excision of encapsulated hematoma abscess popliteal fossa right knee.  Patient will need continued pressure offloading and will not be able to wear his prosthesis for about 2 weeks.  Patient states he does not feel like he can discharge to home without his prosthetic leg.  I will place orders for inpatient versus outpatient rehab.  Tissue margins were clear.

## 2024-08-05 NOTE — Progress Notes (Addendum)
 Daily Progress Note Intern Pager: [phone]  Patient name: [name] [name] Medical record number: [id number] Date of birth: [birth year] Age: 53 y.o. Gender: male  Primary Care Provider: Adele Song, MD Consultants: Nephrology, Orthopedic surgery Code Status: Full  Pt Overview and Major Events to Date:  9/6: Admitted to FMTS; Abscess aspiration 9/8: HD Catheter exchanged by IR 9/12: I&D performed by ortho  Medical Decision Making:  [name] [name] is a 53 y.o. 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 CT knee 9/6 with soft tissue thickening suspicious for subQ abscess. S/p knee aspiration performed by EDP and I&D performed by ortho. Receiving broad IV abx with no growth on aspiration culture. Orthopedic surgery following.  Assessment & Plan Abscess of right lower extremity Remains afebrile, however pain has worsened.  Now post I&D by ortho yesterday (9/12). Pt reports 9/10 pain to the knee. Ortho advised continued pressure offloading and to not wear his prosthesis for about 2 weeks - Cont Vanc T/Th/Sat w/ HD and change Ceftriaxone  2g daily to ceftazidine on discharge for a total of 14 days of coverage (9/6-9/19) - Ortho performed I&D 9/12, without complication - Pain regimen: increased Oxycodone  to 5-7.5 mg q4h prn, Tylenol  scheduled q8h.  - Fall precautions  - PT/ OT recs appreciated as patient doesn't think he will be stable to discharge home w/o prosthesis use - Ortho on board, appreciate recommendations Pleural effusion Oxygenation and RR stable on room air. No dyspnea noted on exam. CXR 9/11 shows increased bilateral pleural effusions and bibasilar airspace disease and cardiomegaly. Most likely associated with insufficient HD volume given reduction of symptoms after HD.  - Continue Lasix  40 mg BID - HD Tu/Th/Sat as scheduled - Home Trelegy, albuterol  prn ESRD on hemodialysis  (HCC) Mechanical complication of dialysis catheter (HCC) HD scheduled today 9/13. - Appreciate Nephrology recs - AM RFP tomorrow given hyperkalemia today (s/p 1x Lokelma  today) Uncontrolled type 2 diabetes mellitus with hyperglycemia, with long-term current use of insulin  (HCC) Blood glucose levels improved. A1c 1 month ago 7.3.  CBG levels with sustained elevation, > 300 despite 5u insulin  aspart injections. Of note, pt received glucocorticoids for surgical intervention.  - Increase Lantus  from 5u to 8u daily w/ very sensitive SSI and reassess Severe hypertension BP readings near the patient's baseline. Will continue to monitor. No bradycardia after restarting carvedilol .  - Continue home hydralazine , losartan , and carvedilol  Anemia of chronic disease Hgb stable from 8.2> 8.1 this morning. Suspect secondary to chronic disease. Per chart review, unclear documentation of CAD, appears unlikely given cardiology office notes and no cath reports.  - AM CBC - Transfusion threshold <7 unless symptomatic  Chronic health problem GERD - continue home pantoprazole  HLD / PAD - continue home Lipitor Depression - continue home fluoxetine   FEN/GI: Carb Modified PPx: Heparin  DispoSNF--medically stable for discharge   Subjective:  Pt states that he is feeling ok. Reports significant pain to the RLE. Pt denies abdominal discomfort or worsened shortness of breath. Denies any other medical concerns at this time.    Objective: Temp:  [97.9 F (36.6 C)-98.4 F (36.9 C)] 98.4 F (36.9 C) (09/13 0420) Pulse Rate:  [57-86] 86 (09/13 0420) Resp:  [11-20] 17 (09/13 0420) BP: (123-169)/(60-90) 169/90 (09/13 0420) SpO2:  [90 %-99 %] 95 % (09/13 0420) Weight:  [106.6 kg] 106.6 kg (09/12 1436) Physical Exam: General: eating, well  appearing, in no acute distress. Cardiovascular: regular rate and rhythm. No murmurs noted.  Respiratory: lungs clear to auscultation bilaterally without wheezes or rhonchi.  Diminished breath sounds to LLL Abdomen: soft, nontender, nondistended Extremities: surgical bandaging without signs of substantial bleeding.   Laboratory: Most recent CBC Lab Results  Component Value Date   WBC 6.1 08/05/2024   HGB 8.1 (L) 08/05/2024   HCT 27.5 (L) 08/05/2024   MCV 73.7 (L) 08/05/2024   PLT 148 (L) 08/05/2024   Most recent BMP    Latest Ref Rng & Units 08/05/2024    4:34 AM  BMP  Glucose 70 - 99 mg/dL 606   BUN 6 - 20 mg/dL 49   Creatinine 9.38 - 1.24 mg/dL 2.53   Sodium 864 - 854 mmol/L 127   Potassium 3.5 - 5.1 mmol/L 6.1   Chloride 98 - 111 mmol/L 92   CO2 22 - 32 mmol/L 22   Calcium  8.9 - 10.3 mg/dL 8.2     [name], [name], Medical Student 08/05/2024, 7:25 AM  MS4 AI, [facility] FPTS Intern pager: [phone], text pages welcome Secure chat group [facility] Teaching Service    I have discussed the above with Student Doctor [name] and agree with the documented plan. My edits for correction/addition/clarification are included above. Please see any attending notes.   Kathrine Melena, DO PGY-2, [facility] 08/05/2024 2:20 PM

## 2024-08-05 NOTE — Assessment & Plan Note (Addendum)
 HD scheduled today 9/13. - Appreciate Nephrology recs - AM RFP tomorrow given hyperkalemia today (s/p 1x Lokelma  today)

## 2024-08-05 NOTE — Assessment & Plan Note (Signed)
 Stable, improved after HD yesterday.  - Continue Coreg  12.5mg  BID, Hydralazine  200mg  BID, Losartan  25mg  daily, Diltiazem  300mg  daily

## 2024-08-05 NOTE — Assessment & Plan Note (Signed)
 Pending this morning's CBC. No sign of active bleeding.  - AM CBC - Transfusion threshold <7 unless symptomatic

## 2024-08-05 NOTE — Assessment & Plan Note (Signed)
 HD T/Th/S. S/p TDC exchange with IR 9/8.  - Nephrology following, appreciate recommendations - am RFP

## 2024-08-05 NOTE — Assessment & Plan Note (Signed)
 Uncontrolled, CBGs 300s overnight. A1C 7.3 05/2024.  - Very sensitive SSI - Lantus  8 units daily, will see how he does with this dose today - CBG QID

## 2024-08-05 NOTE — Assessment & Plan Note (Addendum)
 Oxygenation and RR stable on room air. No dyspnea noted on exam. CXR 9/11 shows increased bilateral pleural effusions and bibasilar airspace disease and cardiomegaly. Most likely associated with insufficient HD volume given reduction of symptoms after HD.  - Continue Lasix  40 mg BID - HD Tu/Th/Sat as scheduled - Home Trelegy, albuterol  prn

## 2024-08-05 NOTE — Progress Notes (Addendum)
 PT Cancellation Note  Patient Details Name: [name] [name] MRN: [id number] DOB: February 24, 1971   Cancelled Treatment:    Reason Eval/Treat Not Completed: Patient declined, no reason specified;Pain limiting ability to participate;  (PT consult appreciated and chart reviewed. Introduced self and purpose of PT evaluation. Pt declined reporting I do not need therapy and c/o 9/10 RLE pain. Reviewed pt's weight bearing status and restriction of not wearing prosthesis for 2 weeks. Pt reported its only 1 week. I cannot move without my prosthesis.) Will re-attempt PT evaluation as schedule permits.   Randall SAUNDERS, PT, DPT Acute Rehabilitation Services Office: [phone] Secure Chat Preferred  Delon CHRISTELLA Callander 08/05/2024, 3:21 PM

## 2024-08-05 NOTE — Assessment & Plan Note (Addendum)
 BP readings near the patient's baseline. Will continue to monitor. No bradycardia after restarting carvedilol .  - Continue home hydralazine , losartan , and carvedilol 

## 2024-08-05 NOTE — Progress Notes (Signed)
 OT Cancellation Note  Patient Details Name: [name] [name] MRN: [id number] DOB: [birth year]   Cancelled Treatment:    Reason Eval/Treat Not Completed: Patient at procedure or test/ unavailable. At HD will return as schedule allows.   [name] [name] [name], OTR/L [facility] Acute Rehabilitation Office: [phone]   [name] [name] 08/05/2024, 12:07 PM

## 2024-08-05 NOTE — Progress Notes (Signed)
     Daily Progress Note Intern Pager: [phone]  Patient name: [name] [name] Medical record number: [id number] Date of birth: July 25, 1971 Age: 53 y.o. Gender: male  Primary Care Provider: Adele Song, MD Consultants: nephrology, orthopedic surgery Code Status: Full  Pt Overview and Major Events to Date:  9/6 - Admitted 9/8 - TDC exchange by IR 9/12 - excision of encapsulated hematoma abscess popliteal fossa R knee by Dr. Harden  Assessment and Plan:  53yo male PMHx T2DM, ESRD on HD, HTN, R BKA admitted for R knee abscess, now POD2 from ortho intervention. Continuing IV antibiotics.  Assessment & Plan Abscess of right lower extremity POD2 excision of encapsulated hematoma abscess popliteal fossa R knee by Dr. Harden.  - Continue Vancomycin  T/Th/S with HD (9/9-) - Continue Ceftriaxone , transition to Ceftazidine at discharge (9/6-9/19) - Tylenol  1000mg  q6h, Oxycodone  5-7.5mg  q4h PRN - NWB on R stump - no prostheses for two weeks - PT/OT eval - pt unable to safely go home without prosthesis - Fall precautions ESRD on hemodialysis (HCC) Mechanical complication of dialysis catheter (HCC) HD T/Th/S. S/p TDC exchange with IR 9/8.  - Nephrology following, appreciate recommendations - am RFP Uncontrolled type 2 diabetes mellitus with hyperglycemia, with long-term current use of insulin  (HCC) Uncontrolled, CBGs 300s overnight. A1C 7.3 05/2024.  - Very sensitive SSI - Lantus  8 units daily, will see how he does with this dose today - CBG QID Severe hypertension Stable, improved after HD yesterday.  - Continue Coreg  12.5mg  BID, Hydralazine  200mg  BID, Losartan  25mg  daily, Diltiazem  300mg  daily Anemia of chronic disease Pending this morning's CBC. No sign of active bleeding.  - AM CBC - Transfusion threshold <7 unless symptomatic  Chronic health problem GERD - continue home pantoprazole  HLD / PAD - continue home Lipitor Depression - continue home fluoxetine  HFpEF - continue home  Lasix  40mg  BID  FEN/GI: Carb modified with fluid restriction  PPx: Heparin  Dispo:Pending PT recommendations   Subjective:  NAEON. No pain this morning.  Objective: Temp:  [97.4 F (36.3 C)-98.4 F (36.9 C)] 98.4 F (36.9 C) (09/14 0437) Pulse Rate:  [82-98] 86 (09/14 0437) Resp:  [10-20] 17 (09/14 0437) BP: (149-183)/(74-95) 157/85 (09/14 0437) SpO2:  [94 %-99 %] 99 % (09/14 0437) Weight:  [103.9 kg-109.6 kg] 103.9 kg (09/13 1241) Physical Exam: General: No acute distress, resting comfortably on side of bed Cardiovascular: RRR, no murmur Respiratory: CTAB, breathing comfortably on RA Abdomen: bowel sounds present, soft Extremities: no leg swelling LLE, R stump wrapped   Laboratory: Most recent CBC Lab Results  Component Value Date   WBC 6.1 08/05/2024   HGB 8.1 (L) 08/05/2024   HCT 27.5 (L) 08/05/2024   MCV 73.7 (L) 08/05/2024   PLT 148 (L) 08/05/2024   Most recent BMP    Latest Ref Rng & Units 08/05/2024    4:34 AM  BMP  Glucose 70 - 99 mg/dL 606   BUN 6 - 20 mg/dL 49   Creatinine 9.38 - 1.24 mg/dL 2.53   Sodium 864 - 854 mmol/L 127   Potassium 3.5 - 5.1 mmol/L 6.1   Chloride 98 - 111 mmol/L 92   CO2 22 - 32 mmol/L 22   Calcium  8.9 - 10.3 mg/dL 8.2    Edia Pursifull, DO 08/06/2024, 6:23 AM  PGY-2, [facility] FPTS Intern pager: [phone], text pages welcome Secure chat group [facility] Teaching Service

## 2024-08-05 NOTE — Assessment & Plan Note (Addendum)
 Remains afebrile, however pain has worsened.  Now post I&D by ortho yesterday (9/12). Pt reports 9/10 pain to the knee. Ortho advised continued pressure offloading and to not wear his prosthesis for about 2 weeks - Cont Vanc T/Th/Sat w/ HD and change Ceftriaxone  2g daily to ceftazidine on discharge for a total of 14 days of coverage (9/6-9/19) - Ortho performed I&D 9/12, without complication - Pain regimen: increased Oxycodone  to 5-7.5 mg q4h prn, Tylenol  scheduled q8h.  - Fall precautions  - PT/ OT recs appreciated as patient doesn't think he will be stable to discharge home w/o prosthesis use - Ortho on board, appreciate recommendations

## 2024-08-05 NOTE — Assessment & Plan Note (Signed)
 GERD - continue home pantoprazole  HLD / PAD - continue home Lipitor Depression - continue home fluoxetine  HFpEF - continue home Lasix  40mg  BID

## 2024-08-05 NOTE — Progress Notes (Signed)
 [facility] Progress Note   Subjective:   Seen in room.  OR debridement of abscess yesterday with Dr. Harden. For HD today.   Objective Vitals:   08/04/24 2015 08/04/24 2030 08/04/24 2044 08/05/24 0420  BP: 124/60 (!) 146/68 (!) 146/72 (!) 169/90  Pulse: 74 71 69 86  Resp: 20 17 16 17   Temp:  97.9 F (36.6 C) 98 F (36.7 C) 98.4 F (36.9 C)  TempSrc:   Oral Oral  SpO2: 91% 93% 96% 95%  Weight:      Height:       Physical Exam General:  Chronically ill appearing man, NAD Heart: RRR; no murmur Lungs: CTA anteriorly Abdomen: soft Extremities: R BKA, no LLE edema - skin shriveled Dialysis Access: TDC in R chest, LUE AVF +t/b   Additional Objective Labs: Basic Metabolic Panel: Recent Labs  Lab 08/02/24 0500 08/03/24 1145 08/04/24 0506 08/05/24 0434  NA 133* 129* 131* 127*  K 4.4 5.4* 5.0 6.1*  CL 95* 94* 93* 92*  CO2 24 22 25 22   GLUCOSE 236* 230* 184* 393*  BUN 45* 61* 38* 49*  CREATININE 6.96* 8.58* 6.23* 7.46*  CALCIUM  7.7* 7.9* 8.1* 8.2*  PHOS 3.8 5.0*  --  5.1*   Liver Function Tests: Recent Labs  Lab 08/02/24 0500 08/03/24 1145 08/05/24 0434  ALBUMIN  2.5* 2.6* 2.7*   CBC: Recent Labs  Lab 07/29/24 1429 07/30/24 0448 08/01/24 0824 08/02/24 0500 08/03/24 1146 08/04/24 0506 08/05/24 0434  WBC 8.6   < > 8.1 6.2 5.6 6.0 6.1  NEUTROABS 7.1  --   --   --   --   --   --   HGB 7.8*   < > 7.6* 7.5* 7.2* 8.2* 8.1*  HCT 27.3*   < > 26.8* 25.6* 24.9* 28.0* 27.5*  MCV 76.0*   < > 76.1* 73.4* 75.2* 73.9* 73.7*  PLT 111*   < > 109* 125* 117* 158 148*   < > = values in this interval not displayed.   Blood Culture    Component Value Date/Time   SDES KNEE 07/29/2024 1414   SPECREQUEST ABSCESS POSTERIOR KNEE 07/29/2024 1414   CULT  07/29/2024 1414    NO GROWTH 3 DAYS Performed at [facility], [address], [place], [place] [address]    REPTSTATUS 08/01/2024 FINAL 07/29/2024 1414    Studies/Results: DG CHEST PORT 1 VIEW Result  Date: 08/03/2024 CLINICAL DATA:  Dyspnea EXAM: PORTABLE CHEST 1 VIEW COMPARISON:  07/29/2024 FINDINGS: Right-sided central venous catheter tip at the cavoatrial region. Cardiomegaly with vascular congestion and pulmonary edema. Increased bilateral pleural effusions and bibasilar airspace disease. Bandlike atelectasis in the left perihilar region. No pneumothorax IMPRESSION: Cardiomegaly with vascular congestion and pulmonary edema. Increased bilateral pleural effusions and bibasilar airspace disease. Electronically Signed   By: Luke Bun M.D.   On: 08/03/2024 16:47    Medications:  cefTRIAXone  (ROCEPHIN )  IV Stopped (08/03/24 2230)   vancomycin  Stopped (08/03/24 1503)    acetaminophen   1,000 mg Oral Q6H   Or   acetaminophen   650 mg Rectal Q6H   atorvastatin   40 mg Oral Daily   budesonide -glycopyrrolate -formoterol   2 puff Inhalation BID   carvedilol   12.5 mg Oral BID WC   Chlorhexidine  Gluconate Cloth  6 each Topical Q0600   diltiazem   300 mg Oral Daily   FLUoxetine   40 mg Oral Daily   furosemide   40 mg Oral BID   gabapentin   300 mg Oral Daily  heparin   5,000 Units Subcutaneous Q8H   hydrALAZINE   200 mg Oral BID   insulin  aspart  0-6 Units Subcutaneous TID WC   insulin  glargine  5 Units Subcutaneous Daily   losartan   25 mg Oral Daily   pantoprazole   40 mg Oral Daily   sodium chloride  flush  10-40 mL Intracatheter Q12H   tamsulosin   0.4 mg Oral Daily   Dialysis Orders TTS - [facility] 4:88min, BFR 400, EDW 100.4kg, 2K bath, TDC + AVF, heparin  2000 - last HD 9/6, post-HD weight 101.6kg - Mircera 225mcg IV q 2 weeks (last 9/6)  Assessment/Plan: R knee abscess: Cx NGTD, on Vanc/ceftriaxone  here HD access: TDC with limb crack, s/p exchange with IR 9/8, has developing LUE AVF +t/b ESRD: Continue HD on TTS schedule - HD next today   HTN/volume: BP can be very labile - looking acceptable in the past 12h, no changes to meds at this time.  Anemia of ESRD: Hgb 7-8s - not due for ESA yet; no  IV iron  in setting of serious infection. Secondary HPTH: Ca/Phos ok - continue home meds. T2DM  Ok for d/c from nephrology perspective when medically ready.  Manuelita Barters MD [facility] Pager [phone]

## 2024-08-05 NOTE — Assessment & Plan Note (Addendum)
 Blood glucose levels improved. A1c 1 month ago 7.3.  CBG levels with sustained elevation, > 300 despite 5u insulin  aspart injections. Of note, pt received glucocorticoids for surgical intervention.  - Increase Lantus  from 5u to 8u daily w/ very sensitive SSI and reassess

## 2024-08-05 NOTE — Anesthesia Postprocedure Evaluation (Signed)
 Anesthesia Post Note  Patient: [name] [name]  Procedure(s) Performed: IRRIGATION AND DEBRIDEMENT KNEE, RIGHT (Right: Knee)     Patient location during evaluation: PACU Anesthesia Type: General Level of consciousness: awake and alert Pain management: pain level controlled Vital Signs Assessment: post-procedure vital signs reviewed and stable Respiratory status: spontaneous breathing, nonlabored ventilation, respiratory function stable and patient connected to nasal cannula oxygen Cardiovascular status: blood pressure returned to baseline and stable Postop Assessment: no apparent nausea or vomiting Anesthetic complications: no   No notable events documented.  Last Vitals:  Vitals:   08/05/24 0936 08/05/24 1000  BP: (!) 155/76 (!) 156/76  Pulse: 82 84  Resp: 17 12  Temp:    SpO2: 99% 98%    Last Pain:  Vitals:   08/05/24 0915  TempSrc:   PainSc: 0-No pain                 Lynwood MARLA Cornea

## 2024-08-05 NOTE — Assessment & Plan Note (Addendum)
 GERD - continue home pantoprazole  HLD / PAD - continue home Lipitor Depression - continue home fluoxetine 

## 2024-08-06 DIAGNOSIS — L02415 Cutaneous abscess of right lower limb: Secondary | ICD-10-CM | POA: Diagnosis not present

## 2024-08-06 LAB — RENAL FUNCTION PANEL
Albumin: 2.9 g/dL — ABNORMAL LOW (ref 3.5–5.0)
Anion gap: 13 (ref 5–15)
BUN: 47 mg/dL — ABNORMAL HIGH (ref 6–20)
CO2: 25 mmol/L (ref 22–32)
Calcium: 8.2 mg/dL — ABNORMAL LOW (ref 8.9–10.3)
Chloride: 93 mmol/L — ABNORMAL LOW (ref 98–111)
Creatinine, Ser: 6.87 mg/dL — ABNORMAL HIGH (ref 0.61–1.24)
GFR, Estimated: 9 mL/min — ABNORMAL LOW (ref >60)
Glucose, Bld: 262 mg/dL — ABNORMAL HIGH (ref 70–99)
Phosphorus: 5.1 mg/dL — ABNORMAL HIGH (ref 2.5–4.6)
Potassium: 5 mmol/L (ref 3.5–5.1)
Sodium: 131 mmol/L — ABNORMAL LOW (ref 135–145)

## 2024-08-06 LAB — GLUCOSE, CAPILLARY
Glucose-Capillary: 266 mg/dL — ABNORMAL HIGH (ref 70–99)
Glucose-Capillary: 338 mg/dL — ABNORMAL HIGH (ref 70–99)
Glucose-Capillary: 282 mg/dL — ABNORMAL HIGH (ref 70–99)
Glucose-Capillary: 316 mg/dL — ABNORMAL HIGH (ref 70–99)

## 2024-08-06 LAB — CBC
HCT: 27.2 % — ABNORMAL LOW (ref 39.0–52.0)
Hemoglobin: 7.8 g/dL — ABNORMAL LOW (ref 13.0–17.0)
MCH: 21.3 pg — ABNORMAL LOW (ref 26.0–34.0)
MCHC: 28.7 g/dL — ABNORMAL LOW (ref 30.0–36.0)
MCV: 74.3 fL — ABNORMAL LOW (ref 80.0–100.0)
Platelets: 184 K/uL (ref 150–400)
RBC: 3.66 MIL/uL — ABNORMAL LOW (ref 4.22–5.81)
RDW: 19.5 % — ABNORMAL HIGH (ref 11.5–15.5)
WBC: 8.8 K/uL (ref 4.0–10.5)
nRBC: 0.2 % (ref 0.0–0.2)

## 2024-08-06 MED ORDER — INSULIN ASPART 100 UNIT/ML IJ SOLN
4.0000 [IU] | Freq: Once | INTRAMUSCULAR | Status: AC
  Administered 2024-08-06: 4 [IU] via SUBCUTANEOUS

## 2024-08-06 MED ORDER — INSULIN GLARGINE 100 UNIT/ML ~~LOC~~ SOLN
4.0000 [IU] | Freq: Once | SUBCUTANEOUS | Status: AC
Start: 2024-08-06 — End: 2024-08-06
  Administered 2024-08-06: 4 [IU] via SUBCUTANEOUS
  Filled 2024-08-06 (×2): qty 0.04

## 2024-08-06 MED ORDER — INSULIN GLARGINE 100 UNIT/ML ~~LOC~~ SOLN
12.0000 [IU] | Freq: Every day | SUBCUTANEOUS | Status: DC
Start: 2024-08-07 — End: 2024-08-08
  Administered 2024-08-07 – 2024-08-08 (×2): 12 [IU] via SUBCUTANEOUS
  Filled 2024-08-06 (×2): qty 0.12

## 2024-08-06 NOTE — Progress Notes (Signed)
 Pt states he has not had a Bm since Wednesday. Pt does not want a stool softener at this time.

## 2024-08-06 NOTE — Evaluation (Signed)
 Physical Therapy Evaluation Patient Details Name: SIRIUS WOODFORD MRN: [id number] DOB: [birth year] Today's Date: 08/06/2024  History of Present Illness  [name] [name] is a 53 y.o. male admitted 07/29/24 for right popliteal fossa abscess who failed conservative measures and elected for surgical management. Pt s/p excisional debridement 9/12. PMHx: HTN, PVD s/p R BKA and L great toe amputation, SVT, GERD, depression, T2DM, and ESRD on HD.   Clinical Impression  Pt admitted with above diagnosis. PTA, pt was modI for functional mobility using rollator and wearing R BKA prosthesis and independent with ADLs/IADLs. He lives with his mom and sister in a one story house with 3 STE. Pt reports his family cannot provide much physical assist. Pt currently with functional limitations due to the deficits listed below (see PT Problem List). He performed bed mobility with supervision, sit<>stand using RW with CGA, bed<>chair lateral scoot transfer with CGA, and gait using RW with CGA. Pt benefited from +2 assist for safety throughout functional mobility for increased confidence d/t fear of falling. He is primarily limited by weight-bearing status and inability to use prosthesis. Provided extensive education regarding precautions/restrictions. Pt will benefit from acute skilled PT to increase his independence and safety with mobility to allow discharge. Recommend continued inpatient follow up therapy, <3 hours/day.    If plan is discharge home, recommend the following: A lot of help with walking and/or transfers;A lot of help with bathing/dressing/bathroom;Assistance with cooking/housework;Assist for transportation;Help with stairs or ramp for entrance   Can travel by private vehicle   Yes    Equipment Recommendations None recommended by PT (Pt already has DME)  Recommendations for Other Services       Functional Status Assessment Patient has had a recent decline in their functional status and demonstrates  the ability to make significant improvements in function in a reasonable and predictable amount of time.     Precautions / Restrictions Precautions Precautions: Fall Recall of Precautions/Restrictions: Impaired Precaution/Restrictions Comments: Educated pt that he needsto continue pressure offloading on his RLE and will not be able to wear his prosthesis for about 2 weeks. Instructed pt to remove his neoprene sleve that he wears to use prosthesis. Pt reluctant, but agreed. Advised him to maintain only the dressing and continue elevation. on RLE. Restrictions Weight Bearing Restrictions Per Provider Order: Yes RLE Weight Bearing Per Provider Order: Non weight bearing Other Position/Activity Restrictions: cannot wear prosthesis for 2 weeks      Mobility  Bed Mobility Overal bed mobility: Needs Assistance Bed Mobility: Supine to Sit, Sit to Supine     Supine to sit: Supervision, Used rails Sit to supine: Supervision   General bed mobility comments: Pt sat up on R side of bed with increased time. HOB flat. Heavy reliance on bedrails. No cues for sequencing. Pt returned to bed swinging BLE back in and placing pillow under RLE to elevate.    Transfers Overall transfer level: Needs assistance Equipment used: Rolling walker (2 wheels) Transfers: Sit to/from Stand, Bed to chair/wheelchair/BSC Sit to Stand: Contact guard assist, +2 safety/equipment, From elevated surface          Lateral/Scoot Transfers: Contact guard assist, +2 safety/equipment, From elevated surface General transfer comment: Pt stood from significantly elevated bed height. Cued proper hand placement using RW. Powered up with CGA. Pt transferred to/from recliner chair by scooting. Cued proper sequencing. Instructed pt to go in increments. He raised the bed so he had a higher surface going to the chair and was at a level  surface going back to bed.    Ambulation/Gait Ambulation/Gait assistance: Contact guard assist, +2  safety/equipment Gait Distance (Feet): 8 Feet Assistive device: Rolling walker (2 wheels) Gait Pattern/deviations: Step-to pattern Gait velocity: decreased     General Gait Details: Educated pt on hopping technique. Demonstrated correct sequencing using RW. He took short slow steps with heavy dependence on BUE support on RW. No LOB. Distance limited d/t fatigue and pt's fear of falling.  Stairs            Wheelchair Mobility     Tilt Bed    Modified Rankin (Stroke Patients Only)       Balance Overall balance assessment: Needs assistance Sitting-balance support: No upper extremity supported, Feet supported Sitting balance-Leahy Scale: Good Sitting balance - Comments: Pt sat EOB with supervision. He reached outside his BOS to don/doff tennis shoe on L foot.   Standing balance support: Bilateral upper extremity supported, During functional activity, Reliant on assistive device for balance Standing balance-Leahy Scale: Poor Standing balance comment: Pt dependent on RW                             Pertinent Vitals/Pain Pain Assessment Pain Assessment: 0-10 Pain Score: 9  Pain Location: R knee Pain Descriptors / Indicators: Operative site guarding, Discomfort, Aching, Sore Pain Intervention(s): Premedicated before session, Monitored during session, Limited activity within patient's tolerance, Repositioned    Home Living Family/patient expects to be discharged to:: Private residence Living Arrangements: Parent;Other relatives (Mom and Sister) Available Help at Discharge: Family;Available 24 hours/day (cannot physically assist much) Type of Home: House Home Access: Stairs to enter Entrance Stairs-Rails: Left;Right;Can reach both Entrance Stairs-Number of Steps: 3   Home Layout: One level Home Equipment: Wheelchair - Forensic psychologist (2 wheels);Grab bars - tub/shower;Hand held shower head;BSC/3in1;Rollator (4 wheels)      Prior Function Prior Level of  Function : Independent/Modified Independent;Driving             Mobility Comments: ModI using rollator and R prosthesis. Pt reports he will just sit in manual w/c. Denies fall. ADLs Comments: Indep with ADL/IADLs, driving     Extremity/Trunk Assessment   Upper Extremity Assessment Upper Extremity Assessment: Defer to OT evaluation    Lower Extremity Assessment Lower Extremity Assessment: Overall WFL for tasks assessed    Cervical / Trunk Assessment Cervical / Trunk Assessment: Normal  Communication   Communication Communication: No apparent difficulties    Cognition Arousal: Alert Behavior During Therapy: Flat affect, Anxious   PT - Cognitive impairments: No family/caregiver present to determine baseline, Awareness, Problem solving, Safety/Judgement                       PT - Cognition Comments: Pt A,Ox4. He appears to have decreased insight into his current health situation. Educated pt on WBing status and restrictions. Pt is nervous to mobilize without prosthesis and is fearful of falling. Following commands: Impaired Following commands impaired: Follows one step commands with increased time, Follows multi-step commands with increased time     Cueing Cueing Techniques: Verbal cues, Gestural cues, Tactile cues, Visual cues     General Comments General comments (skin integrity, edema, etc.): VSS on RA    Exercises     Assessment/Plan    PT Assessment Patient needs continued PT services  PT Problem List Decreased activity tolerance;Decreased balance;Decreased mobility;Decreased knowledge of use of DME;Decreased safety awareness;Decreased knowledge of precautions;Pain  PT Treatment Interventions DME instruction;Gait training;Stair training;Functional mobility training;Therapeutic activities;Therapeutic exercise;Balance training;Patient/family education;Wheelchair mobility training;Cognitive remediation    PT Goals (Current goals can be found in the  Care Plan section)  Acute Rehab PT Goals Patient Stated Goal: Be able to use prosthesis again PT Goal Formulation: With patient Time For Goal Achievement: 08/20/24 Potential to Achieve Goals: Fair    Frequency Min 2X/week     Co-evaluation PT/OT/SLP Co-Evaluation/Treatment: Yes Reason for Co-Treatment: Necessary to address cognition/behavior during functional activity;For patient/therapist safety;To address functional/ADL transfers PT goals addressed during session: Mobility/safety with mobility;Balance;Proper use of DME         AM-PAC PT 6 Clicks Mobility  Outcome Measure Help needed turning from your back to your side while in a flat bed without using bedrails?: A Little Help needed moving from lying on your back to sitting on the side of a flat bed without using bedrails?: A Little Help needed moving to and from a bed to a chair (including a wheelchair)?: A Lot Help needed standing up from a chair using your arms (e.g., wheelchair or bedside chair)?: A Lot Help needed to walk in hospital room?: A Lot Help needed climbing 3-5 steps with a railing? : Total 6 Click Score: 13    End of Session Equipment Utilized During Treatment: Gait belt Activity Tolerance: Patient tolerated treatment well Patient left: in bed;with call bell/phone within reach;with bed alarm set Nurse Communication: Mobility status PT Visit Diagnosis: Other abnormalities of gait and mobility (R26.89);Difficulty in walking, not elsewhere classified (R26.2);Pain Pain - Right/Left: Right Pain - part of body: Knee    Time: 1135-1207 PT Time Calculation (min) (ACUTE ONLY): 32 min   Charges:   PT Evaluation $PT Eval Moderate Complexity: 1 Mod   PT General Charges $$ ACUTE PT VISIT: 1 Visit         Randall SAUNDERS, PT, DPT Acute Rehabilitation Services Office: [phone] Secure Chat Preferred  Delon CHRISTELLA Callander 08/06/2024, 12:58 PM

## 2024-08-06 NOTE — Progress Notes (Signed)
 [facility] Progress Note   Subjective:   Seen in room.  HD yesterday 3L.  Tolerated well.   Objective Vitals:   08/05/24 1957 08/06/24 0437 08/06/24 0813 08/06/24 0830  BP: (!) 149/74 (!) 157/85  (!) 152/77  Pulse: 88 86 88 75  Resp: 17 17 18 15   Temp: 98.4 F (36.9 C) 98.4 F (36.9 C)  97.8 F (36.6 C)  TempSrc:      SpO2: 95% 99% 99% 97%  Weight:      Height:       Physical Exam General:  Chronically ill appearing man, NAD Heart: RRR; no murmur Lungs: CTA anteriorly Abdomen: soft Extremities: R BKA, no LLE edema - skin shriveled Dialysis Access: TDC in R chest, LUE AVF +t/b   Additional Objective Labs: Basic Metabolic Panel: Recent Labs  Lab 08/03/24 1145 08/04/24 0506 08/05/24 0434 08/06/24 0714  NA 129* 131* 127* 131*  K 5.4* 5.0 6.1* 5.0  CL 94* 93* 92* 93*  CO2 22 25 22 25   GLUCOSE 230* 184* 393* 262*  BUN 61* 38* 49* 47*  CREATININE 8.58* 6.23* 7.46* 6.87*  CALCIUM  7.9* 8.1* 8.2* 8.2*  PHOS 5.0*  --  5.1* 5.1*   Liver Function Tests: Recent Labs  Lab 08/03/24 1145 08/05/24 0434 08/06/24 0714  ALBUMIN  2.6* 2.7* 2.9*   CBC: Recent Labs  Lab 08/02/24 0500 08/03/24 1146 08/04/24 0506 08/05/24 0434 08/06/24 0714  WBC 6.2 5.6 6.0 6.1 8.8  HGB 7.5* 7.2* 8.2* 8.1* 7.8*  HCT 25.6* 24.9* 28.0* 27.5* 27.2*  MCV 73.4* 75.2* 73.9* 73.7* 74.3*  PLT 125* 117* 158 148* 184   Blood Culture    Component Value Date/Time   SDES KNEE 07/29/2024 1414   SPECREQUEST ABSCESS POSTERIOR KNEE 07/29/2024 1414   CULT  07/29/2024 1414    NO GROWTH 3 DAYS Performed at [facility], [address], [place], [place] [address]    REPTSTATUS 08/01/2024 FINAL 07/29/2024 1414    Studies/Results: No results found.   Medications:  cefTRIAXone  (ROCEPHIN )  IV 2 g (08/05/24 2101)   vancomycin  1,000 mg (08/05/24 1337)    acetaminophen   1,000 mg Oral Q6H   Or   acetaminophen   650 mg Rectal Q6H   atorvastatin   40 mg Oral Daily    budesonide -glycopyrrolate -formoterol   2 puff Inhalation BID   carvedilol   12.5 mg Oral BID WC   Chlorhexidine  Gluconate Cloth  6 each Topical Q0600   diltiazem   300 mg Oral Daily   FLUoxetine   40 mg Oral Daily   furosemide   40 mg Oral BID   gabapentin   300 mg Oral Daily   heparin   5,000 Units Subcutaneous Q8H   hydrALAZINE   200 mg Oral BID   insulin  aspart  0-6 Units Subcutaneous TID WC   insulin  glargine  8 Units Subcutaneous Daily   losartan   25 mg Oral Daily   pantoprazole   40 mg Oral Daily   sodium chloride  flush  10-40 mL Intracatheter Q12H   tamsulosin   0.4 mg Oral Daily   Dialysis Orders TTS - [facility] 4:80min, BFR 400, EDW 100.4kg, 2K bath, TDC + AVF, heparin  2000 - last HD 9/6, post-HD weight 101.6kg - Mircera 225mcg IV q 2 weeks (last 9/6)  Assessment/Plan: R knee abscess: Cx NGTD, on Vanc/ceftriaxone  here, s/p OR 9/12 with Dr. Harden wash out.  HD access: TDC with limb crack, s/p exchange with IR 9/8, has developing LUE AVF +t/b ESRD: Continue HD on TTS schedule - HD next  Tues   HTN/volume: BP can be very labile - looking acceptable in the past 12h, no changes to meds at this time.  Anemia of ESRD: Hgb 7-8s - not due for ESA yet; no IV iron  in setting of serious infection. Secondary HPTH: Ca/Phos ok - continue home meds. T2DM  Ok for d/c from nephrology perspective when medically ready.  Manuelita Barters MD [facility] Pager [phone]

## 2024-08-06 NOTE — Evaluation (Signed)
 Occupational Therapy Evaluation Patient Details Name: [name] [name] MRN: [id number] DOB: 10/28/1971 Today's Date: 08/06/2024   History of Present Illness   [name] [name] is a 53 y.o. male admitted 07/29/24 for right popliteal fossa abscess who failed conservative measures and elected for surgical management. Pt s/p excisional debridement 9/12. PMHx: HTN, PVD s/p R BKA and L great toe amputation, SVT, GERD, depression, T2DM, and ESRD on HD.     Clinical Impressions Pt sitting EOB with PT upon entry. Pt lives with mom and sister, family can assist as needed, PLOF mod I using R prosthesis and rollator, mod I with ADLs/IADLs. Pt currently requires CGA x2 assist for safety with ambulation and RW, not able to use prosthetic for 1-2 more weeks. Pt able to complete ADLs with set up, increased time. Pt has some decreased safety awareness and awareness of deficits, would benefit from continued acute OT to maximize safety and functional strength with transfers until able to use prosthetic again. Family not able to provide level of support needed to remain safe/functional at this time, would benefit from postacute rehab <3hrs/day until back to mod I level. Pt needs new shower seat for home as his last one had a screw loose and slowly got more and more bent until it snapped, per Pt.      If plan is discharge home, recommend the following:   Two people to help with walking and/or transfers;A little help with bathing/dressing/bathroom;Assistance with cooking/housework;Assist for transportation;Help with stairs or ramp for entrance     Functional Status Assessment   Patient has had a recent decline in their functional status and demonstrates the ability to make significant improvements in function in a reasonable and predictable amount of time.     Equipment Recommendations   Other (comment) (shower seat)     Recommendations for Other Services         Precautions/Restrictions    Precautions Precautions: Fall Recall of Precautions/Restrictions: Impaired Precaution/Restrictions Comments: Educated pt that he needsto continue pressure offloading on his RLE and will not be able to wear his prosthesis for about 2 weeks. Instructed pt to remove his neoprene sleve that he wears to use prosthesis. Pt reluctant, but agreed. Advised him to maintain only the dressing and continue elevation. on RLE. Restrictions Weight Bearing Restrictions Per Provider Order: Yes RLE Weight Bearing Per Provider Order: Non weight bearing Other Position/Activity Restrictions: cannot wear prosthesis for 2 weeks     Mobility Bed Mobility Overal bed mobility: Needs Assistance Bed Mobility: Supine to Sit, Sit to Supine     Supine to sit: Supervision, Used rails Sit to supine: Supervision        Transfers Overall transfer level: Needs assistance Equipment used: Rolling walker (2 wheels) Transfers: Sit to/from Stand, Bed to chair/wheelchair/BSC Sit to Stand: Contact guard assist, +2 safety/equipment, From elevated surface          Lateral/Scoot Transfers: Contact guard assist, +2 safety/equipment, From elevated surface General transfer comment: Pt stood from significantly elevated bed height, lateral scoot to and from recliner, all CGA. cueing for safety and hand placement.      Balance Overall balance assessment: Needs assistance Sitting-balance support: No upper extremity supported, Feet supported Sitting balance-Leahy Scale: Good Sitting balance - Comments: Pt sat EOB with supervision. He reached outside his BOS to don/doff tennis shoe on L foot.   Standing balance support: Bilateral upper extremity supported, During functional activity, Reliant on assistive device for balance Standing balance-Leahy Scale: Poor Standing balance comment:  Pt dependent on RW                           ADL either performed or assessed with clinical judgement   ADL Overall ADL's : Needs  assistance/impaired                                       General ADL Comments: set up/supervision for safety     Vision         Perception         Praxis         Pertinent Vitals/Pain Pain Assessment Pain Location: R knee Pain Descriptors / Indicators: Operative site guarding, Discomfort, Aching, Sore     Extremity/Trunk Assessment Upper Extremity Assessment Upper Extremity Assessment: Overall WFL for tasks assessed   Lower Extremity Assessment Lower Extremity Assessment: Defer to PT evaluation   Cervical / Trunk Assessment Cervical / Trunk Assessment: Normal   Communication Communication Communication: No apparent difficulties   Cognition Arousal: Alert Behavior During Therapy: Flat affect, Anxious Cognition: No family/caregiver present to determine baseline             OT - Cognition Comments: Pt with increased time for answering questions at times, Pt with decreased safety awareness and poor insight to deficits. Pt with difficulty understanding risk of elevated blood sugar, RN informed to hold off on giving juice until insulin  shot as BS was 338, Pt got agitated when we were not allowed to give him juice and repeatedly told us  he didn't care what his BS was that he couldn't eat food without juice, water would not work.                 Following commands: Impaired Following commands impaired: Follows one step commands with increased time, Follows multi-step commands with increased time     Cueing  General Comments   Cueing Techniques: Verbal cues;Gestural cues;Tactile cues;Visual cues  VSS on RA   Exercises     Shoulder Instructions      Home Living Family/patient expects to be discharged to:: Private residence Living Arrangements: Parent;Other relatives (Mom and Sister) Available Help at Discharge: Family;Available 24 hours/day (cannot physically assist much) Type of Home: House Home Access: Stairs to enter [employer] of Steps: 3 Entrance Stairs-Rails: Left;Right;Can reach both Home Layout: One level     Bathroom Shower/Tub: Chief Strategy Officer: Handicapped height Bathroom Accessibility: No   Home Equipment: Wheelchair - Forensic psychologist (2 wheels);Grab bars - tub/shower;Hand held shower head;BSC/3in1;Rollator (4 wheels)          Prior Functioning/Environment Prior Level of Function : Independent/Modified Independent;Driving             Mobility Comments: ModI using rollator and R prosthesis. Pt reports he will just sit in manual w/c. Denies fall. ADLs Comments: Indep with ADL/IADLs, driving    OT Problem List: Decreased activity tolerance;Impaired balance (sitting and/or standing);Decreased strength;Decreased safety awareness   OT Treatment/Interventions: Self-care/ADL training;Therapeutic exercise;Energy conservation;DME and/or AE instruction;Therapeutic activities;Patient/family education;Balance training      OT Goals(Current goals can be found in the care plan section)   Acute Rehab OT Goals Patient Stated Goal: to return home OT Goal Formulation: With patient Time For Goal Achievement: 08/20/24 Potential to Achieve Goals: Good   OT Frequency:  Min 2X/week    Co-evaluation PT/OT/SLP Co-Evaluation/Treatment: Yes Reason  for Co-Treatment: Necessary to address cognition/behavior during functional activity;For patient/therapist safety;To address functional/ADL transfers PT goals addressed during session: Mobility/safety with mobility;Balance;Proper use of DME OT goals addressed during session: ADL's and self-care;Proper use of Adaptive equipment and DME      AM-PAC OT 6 Clicks Daily Activity     Outcome Measure Help from another person eating meals?: None Help from another person taking care of personal grooming?: A Little Help from another person toileting, which includes using toliet, bedpan, or urinal?: A Little Help from another person  bathing (including washing, rinsing, drying)?: A Little Help from another person to put on and taking off regular upper body clothing?: A Little Help from another person to put on and taking off regular lower body clothing?: A Little 6 Click Score: 19   End of Session Equipment Utilized During Treatment: Gait belt;Rolling walker (2 wheels) Nurse Communication: Mobility status  Activity Tolerance: Patient tolerated treatment well Patient left: in bed;with call bell/phone within reach  OT Visit Diagnosis: Unsteadiness on feet (R26.81);Other abnormalities of gait and mobility (R26.89);Muscle weakness (generalized) (M62.81)                Time: 8864-8792 OT Time Calculation (min): 32 min Charges:  OT General Charges $OT Visit: 1 Visit OT Evaluation $OT Eval Moderate Complexity: 1 [address] OTR/L   [name] [name] 08/06/2024, 2:11 PM

## 2024-08-07 ENCOUNTER — Encounter (HOSPITAL_COMMUNITY): Payer: Self-pay | Admitting: Orthopedic Surgery

## 2024-08-07 DIAGNOSIS — L02415 Cutaneous abscess of right lower limb: Secondary | ICD-10-CM | POA: Diagnosis not present

## 2024-08-07 LAB — CBC
HCT: 25.5 % — ABNORMAL LOW (ref 39.0–52.0)
Hemoglobin: 7.3 g/dL — ABNORMAL LOW (ref 13.0–17.0)
MCH: 21.5 pg — ABNORMAL LOW (ref 26.0–34.0)
MCHC: 28.6 g/dL — ABNORMAL LOW (ref 30.0–36.0)
MCV: 75 fL — ABNORMAL LOW (ref 80.0–100.0)
Platelets: 162 K/uL (ref 150–400)
RBC: 3.4 MIL/uL — ABNORMAL LOW (ref 4.22–5.81)
RDW: 19.5 % — ABNORMAL HIGH (ref 11.5–15.5)
WBC: 8 K/uL (ref 4.0–10.5)
nRBC: 0.3 % — ABNORMAL HIGH (ref 0.0–0.2)

## 2024-08-07 LAB — RENAL FUNCTION PANEL
Albumin: 2.5 g/dL — ABNORMAL LOW (ref 3.5–5.0)
Anion gap: 15 (ref 5–15)
BUN: 65 mg/dL — ABNORMAL HIGH (ref 6–20)
CO2: 24 mmol/L (ref 22–32)
Calcium: 7.7 mg/dL — ABNORMAL LOW (ref 8.9–10.3)
Chloride: 93 mmol/L — ABNORMAL LOW (ref 98–111)
Creatinine, Ser: 8.25 mg/dL — ABNORMAL HIGH (ref 0.61–1.24)
GFR, Estimated: 7 mL/min — ABNORMAL LOW (ref >60)
Glucose, Bld: 258 mg/dL — ABNORMAL HIGH (ref 70–99)
Phosphorus: 6 mg/dL — ABNORMAL HIGH (ref 2.5–4.6)
Potassium: 6 mmol/L — ABNORMAL HIGH (ref 3.5–5.1)
Sodium: 132 mmol/L — ABNORMAL LOW (ref 135–145)

## 2024-08-07 LAB — GLUCOSE, CAPILLARY
Glucose-Capillary: 369 mg/dL — ABNORMAL HIGH (ref 70–99)
Glucose-Capillary: 266 mg/dL — ABNORMAL HIGH (ref 70–99)
Glucose-Capillary: 181 mg/dL — ABNORMAL HIGH (ref 70–99)
Glucose-Capillary: 185 mg/dL — ABNORMAL HIGH (ref 70–99)

## 2024-08-07 MED ORDER — SODIUM ZIRCONIUM CYCLOSILICATE 10 G PO PACK
10.0000 g | PACK | Freq: Once | ORAL | Status: DC
Start: 2024-08-07 — End: 2024-08-07

## 2024-08-07 MED ORDER — SODIUM ZIRCONIUM CYCLOSILICATE 10 G PO PACK
10.0000 g | PACK | Freq: Once | ORAL | Status: AC
Start: 2024-08-07 — End: 2024-08-07
  Administered 2024-08-07: 10 g via ORAL
  Filled 2024-08-07: qty 1

## 2024-08-07 MED ORDER — INSULIN GLARGINE 100 UNIT/ML ~~LOC~~ SOLN
4.0000 [IU] | Freq: Once | SUBCUTANEOUS | Status: AC
  Administered 2024-08-07: 4 [IU] via SUBCUTANEOUS
  Filled 2024-08-07: qty 0.04

## 2024-08-07 MED ORDER — CHLORHEXIDINE GLUCONATE CLOTH 2 % EX PADS: 6.0000 | MEDICATED_PAD | Freq: Every day | CUTANEOUS | Status: DC

## 2024-08-07 MED ORDER — SODIUM ZIRCONIUM CYCLOSILICATE 10 G PO PACK
10.0000 g | PACK | Freq: Every day | ORAL | Status: DC
  Administered 2024-08-07 – 2024-08-08 (×2): 10 g via ORAL
  Filled 2024-08-07 (×2): qty 1

## 2024-08-07 NOTE — Assessment & Plan Note (Addendum)
 HD T/Th/S. S/p TDC exchange with IR 9/8. Hyperkalemic today.  - Gave Lokelma  10 g this AM - Nephrology following, appreciate recommendations, continue renal diet - am RFP

## 2024-08-07 NOTE — Assessment & Plan Note (Addendum)
 Stable and at patient's baseline of 140s-150s systolic BP.  - Continue Coreg  12.5mg  BID, Hydralazine  200mg  BID, Losartan  25mg  daily, Diltiazem  300mg  daily

## 2024-08-07 NOTE — NC FL2 (Signed)
 [name]  MEDICAID FL2 LEVEL OF CARE FORM     IDENTIFICATION  Patient Name: [name] [name] Birthdate: August 21, 1971 Sex: male Admission Date (Current Location): 07/29/2024  [facility] and [place] Number:  Producer, television/film/video and Address:  [facility], [address], [place], [place] [address]      Provider Number: [id number]  Attending Physician Name and Address:  Delores Suzann HERO, MD  Relative Name and Phone Number:  CORDE, ANTONINI Sister [phone]    Current Level of Care: Hospital Recommended Level of Care: Skilled Nursing Facility Prior Approval Number:    Date Approved/Denied:   PASRR Number: [id number]  Discharge Plan: SNF    Current Diagnoses: Patient Active Problem List   Diagnosis Date Noted   Mechanical complication of dialysis catheter (HCC) 07/29/2024   Abscess of right lower extremity 07/29/2024   Chronic health problem 07/29/2024   ESRD on hemodialysis (HCC) 07/29/2024   Anemia of chronic disease 07/29/2024   Nausea and vomiting 01/26/2024   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/25/2024   Depression 01/24/2024   Nonimmune to hepatitis B virus 10/22/2023   Urinary retention 10/14/2023   Closed intertrochanteric fracture of right hip (HCC) 10/13/2023   Contracture, 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   Pleural effusion 02/16/2023   Allergic rhinitis 11/07/2022   Tobacco dependence 09/11/2022   ESRD (end stage renal disease) on dialysis (HCC) 09/11/2022   GERD with esophagitis 09/02/2021   Acute kidney injury superimposed on CKD (HCC) 04/10/2021   Achilles tendon contracture, left 03/03/2018   Non-pressure chronic ulcer of other part of left foot limited to breakdown of skin (HCC) 02/02/2018   History of right below knee amputation (HCC) 12/22/2017   Acute posthemorrhagic anemia    Falls 11/24/2017   Severe hypertension 11/24/2017   Anemia 11/24/2017   Uncontrolled type 2 diabetes mellitus with hyperglycemia, with long-term current use of insulin  (HCC)  10/11/2017   ARF (acute renal failure) (HCC) 10/11/2017    Orientation RESPIRATION BLADDER Height & Weight     Self, Time, Situation, Place  Normal Continent Weight: 229 lb 0.9 oz (103.9 kg) Height:  6' 5 (195.6 cm)  BEHAVIORAL SYMPTOMS/MOOD NEUROLOGICAL BOWEL NUTRITION STATUS      Continent Diet (see discharge summary)  AMBULATORY STATUS COMMUNICATION OF NEEDS Skin   Limited Assist Verbally Other (Comment), Surgical wounds (abscess behind R knee)                       Personal Care Assistance Level of Assistance  Bathing, Feeding, Dressing Bathing Assistance: Limited assistance Feeding assistance: Independent Dressing Assistance: Limited assistance     Functional Limitations Info  Sight, Hearing, Speech Sight Info: Adequate Hearing Info: Adequate Speech Info: Adequate    SPECIAL CARE FACTORS FREQUENCY  PT (By licensed PT), OT (By licensed OT)     PT Frequency: 5x week OT Frequency: 5x week            Contractures Contractures Info: Not present    Additional Factors Info  Code Status, Allergies, Insulin  Sliding Scale Code Status Info: full Allergies Info: Iron , Tape   Insulin  Sliding Scale Info: Novolog : see discharge summary       Current Medications (08/07/2024):  This is the current hospital active medication list Current Facility-Administered Medications  Medication Dose Route Frequency Provider Last Rate Last Admin   acetaminophen  (TYLENOL ) tablet 1,000 mg  1,000 mg Oral Q6H CollinsMaurilio M, PA-C   1,000 mg at 08/07/24 1206   Or  acetaminophen  (TYLENOL ) suppository 650 mg  650 mg Rectal Q6H Gerome Herring M, PA-C       albuterol  (PROVENTIL ) (2.5 MG/3ML) 0.083% nebulizer solution 2.5 mg  2.5 mg Nebulization Q6H PRN Gerome Herring M, PA-C   2.5 mg at 07/30/24 0501   atorvastatin  (LIPITOR) tablet 40 mg  40 mg Oral Daily Gerome Herring M, PA-C   40 mg at 08/07/24 9056   budesonide -glycopyrrolate -formoterol  (BREZTRI ) 160-9-4.8 MCG/ACT inhaler 2 puff  2  puff Inhalation BID Gerome Herring HERO, PA-C   2 puff at 08/07/24 9292   carvedilol  (COREG ) tablet 12.5 mg  12.5 mg Oral BID WC Gerome Herring M, PA-C   12.5 mg at 08/07/24 0800   cefTRIAXone  (ROCEPHIN ) 2 g in sodium chloride  0.9 % 100 mL IVPB  2 g Intravenous Q24H Gerome Herring M, [place]   Stopped at 08/06/24 2259   Chlorhexidine  Gluconate Cloth 2 % PADS 6 each  6 each Topical Q0600 Gerome Herring HERO, PA-C   6 each at 08/06/24 9358   diltiazem  (CARDIZEM  CD) 24 hr capsule 300 mg  300 mg Oral Daily Gerome Herring M, PA-C   300 mg at 08/07/24 9056   FLUoxetine  (PROZAC ) capsule 40 mg  40 mg Oral Daily Gerome Herring M, PA-C   40 mg at 08/07/24 9056   furosemide  (LASIX ) tablet 40 mg  40 mg Oral BID Gerome Herring M, PA-C   40 mg at 08/07/24 0800   gabapentin  (NEURONTIN ) capsule 300 mg  300 mg Oral Daily Gerome Herring M, PA-C   300 mg at 08/07/24 9056   guaiFENesin  (MUCINEX ) 12 hr tablet 600 mg  600 mg Oral BID PRN Gerome Herring HERO, PA-C   600 mg at 07/31/24 0018   heparin  injection 5,000 Units  5,000 Units Subcutaneous Q8H Gerome Herring HERO, PA-C   5,000 Units at 08/07/24 1342   hydrALAZINE  (APRESOLINE ) tablet 200 mg  200 mg Oral BID Gerome Herring HERO, PA-C   200 mg at 08/07/24 9056   insulin  aspart (novoLOG ) injection 0-6 Units  0-6 Units Subcutaneous TID WC Gerome Herring HERO, PA-C   3 Units at 08/07/24 1207   insulin  glargine (LANTUS ) injection 12 Units  12 Units Subcutaneous Daily Tharon Lung, MD   12 Units at 08/07/24 0944   losartan  (COZAAR ) tablet 25 mg  25 mg Oral Daily Gerome Herring M, PA-C   25 mg at 08/07/24 9056   oxyCODONE  (Oxy IR/ROXICODONE ) immediate release tablet 5 mg  5 mg Oral Q4H PRN Delores Suzann HERO, MD   5 mg at 08/07/24 9240   Or   oxyCODONE  (Oxy IR/ROXICODONE ) immediate release tablet 7.5 mg  7.5 mg Oral Q4H PRN Delores Suzann HERO, MD   7.5 mg at 08/07/24 1211   pantoprazole  (PROTONIX ) EC tablet 40 mg  40 mg Oral Daily Gerome Herring M, PA-C   40 mg at 08/07/24 0944   sodium chloride  flush (NS) 0.9 %  injection 10-40 mL  10-40 mL Intracatheter Q12H Gerome Herring M, PA-C   10 mL at 08/07/24 0945   sodium chloride  flush (NS) 0.9 % injection 10-40 mL  10-40 mL Intracatheter PRN Gerome Herring M, PA-C       sodium zirconium cyclosilicate  (LOKELMA ) packet 10 g  10 g Oral Once Gomes, Adriana, DO       tamsulosin  (FLOMAX ) capsule 0.4 mg  0.4 mg Oral Daily Gerome Herring M, PA-C   0.4 mg at 08/07/24 9056   vancomycin  (VANCOCIN ) IVPB 1000 mg/200 mL premix  1,000 mg  Intravenous Q T,Th,Sa-HD Gerome Maurilio HERO, PA-C 200 mL/hr at 08/05/24 1337 1,000 mg at 08/05/24 1337     Discharge Medications: Please see discharge summary for a list of discharge medications.  Relevant Imaging Results:  Relevant Lab Results:   Additional Information SSN: [id number],  HD: TTS  Deonne Rooks Jon, LCSW

## 2024-08-07 NOTE — Plan of Care (Signed)
 Problem: Education: Goal: Knowledge of General Education information will improve Description: Including pain rating scale, medication(s)/side effects and non-pharmacologic comfort measures 08/07/2024 0823 by Billee Marybelle Ragena SHAUNNA, RN Outcome: Progressing 08/07/2024 0823 by Billee Marybelle Ragena SHAUNNA, RN Outcome: Progressing   Problem: Health Behavior/Discharge Planning: Goal: Ability to manage health-related needs will improve 08/07/2024 0823 by Billee Marybelle Ragena SHAUNNA, RN Outcome: Progressing 08/07/2024 0823 by Billee Marybelle Ragena SHAUNNA, RN Outcome: Progressing   Problem: Clinical Measurements: Goal: Ability to maintain clinical measurements within normal limits will improve 08/07/2024 0823 by Billee Marybelle Ragena SHAUNNA, RN Outcome: Progressing 08/07/2024 0823 by Billee Marybelle Ragena SHAUNNA, RN Outcome: Progressing Goal: Will remain free from infection 08/07/2024 0823 by Billee Marybelle Ragena SHAUNNA, RN Outcome: Progressing 08/07/2024 0823 by Billee Marybelle Ragena SHAUNNA, RN Outcome: Progressing Goal: Diagnostic test results will improve 08/07/2024 0823 by Billee Marybelle Ragena SHAUNNA, RN Outcome: Progressing 08/07/2024 0823 by Billee Marybelle Ragena SHAUNNA, RN Outcome: Progressing Goal: Respiratory complications will improve 08/07/2024 0823 by Billee Marybelle Ragena SHAUNNA, RN Outcome: Progressing 08/07/2024 0823 by Billee Marybelle Ragena SHAUNNA, RN Outcome: Progressing Goal: Cardiovascular complication will be avoided 08/07/2024 0823 by Billee Marybelle Ragena SHAUNNA, RN Outcome: Progressing 08/07/2024 0823 by Billee Marybelle Ragena SHAUNNA, RN Outcome: Progressing   Problem: Activity: Goal: Risk for activity intolerance will decrease 08/07/2024 0823 by Billee Marybelle Ragena SHAUNNA, RN Outcome: Progressing 08/07/2024 0823 by Billee Marybelle Ragena SHAUNNA, RN Outcome: Progressing   Problem: Nutrition: Goal: Adequate nutrition will be maintained 08/07/2024 0823 by Billee Marybelle Ragena SHAUNNA, RN Outcome:  Progressing 08/07/2024 0823 by Billee Marybelle Ragena SHAUNNA, RN Outcome: Progressing   Problem: Coping: Goal: Level of anxiety will decrease 08/07/2024 0823 by Billee Marybelle Ragena SHAUNNA, RN Outcome: Progressing 08/07/2024 0823 by Billee Marybelle Ragena SHAUNNA, RN Outcome: Progressing   Problem: Elimination: Goal: Will not experience complications related to bowel motility 08/07/2024 0823 by Billee Marybelle Ragena SHAUNNA, RN Outcome: Progressing 08/07/2024 0823 by Billee Marybelle Ragena SHAUNNA, RN Outcome: Progressing Goal: Will not experience complications related to urinary retention 08/07/2024 0823 by Billee Marybelle Ragena SHAUNNA, RN Outcome: Progressing 08/07/2024 0823 by Billee Marybelle Ragena SHAUNNA, RN Outcome: Progressing   Problem: Pain Managment: Goal: General experience of comfort will improve and/or be controlled 08/07/2024 0823 by Billee Marybelle Ragena SHAUNNA, RN Outcome: Progressing 08/07/2024 0823 by Billee Marybelle Ragena SHAUNNA, RN Outcome: Progressing   Problem: Safety: Goal: Ability to remain free from injury will improve 08/07/2024 0823 by Billee Marybelle Ragena SHAUNNA, RN Outcome: Progressing 08/07/2024 0823 by Billee Marybelle Ragena SHAUNNA, RN Outcome: Progressing   Problem: Skin Integrity: Goal: Risk for impaired skin integrity will decrease 08/07/2024 0823 by Billee Marybelle Ragena SHAUNNA, RN Outcome: Progressing 08/07/2024 0823 by Billee Marybelle Ragena SHAUNNA, RN Outcome: Progressing   Problem: Education: Goal: Ability to describe self-care measures that may prevent or decrease complications (Diabetes Survival Skills Education) will improve 08/07/2024 0823 by Billee Marybelle Ragena SHAUNNA, RN Outcome: Progressing 08/07/2024 0823 by Billee Marybelle Ragena SHAUNNA, RN Outcome: Progressing Goal: Individualized Educational Video(s) 08/07/2024 0823 by Billee Marybelle Ragena SHAUNNA, RN Outcome: Progressing 08/07/2024 0823 by Billee Marybelle Ragena SHAUNNA, RN Outcome: Progressing   Problem: Coping: Goal: Ability to  adjust to condition or change in health will improve 08/07/2024 0823 by Billee Marybelle Ragena SHAUNNA, RN Outcome: Progressing 08/07/2024 0823 by Billee Marybelle Ragena SHAUNNA, RN Outcome: Progressing   Problem: Fluid Volume: Goal: Ability to maintain a balanced intake and output will improve 08/07/2024 0823 by Billee Marybelle Ragena SHAUNNA, RN Outcome: Progressing 08/07/2024 [phone]  by Billee Marybelle Ragena SHAUNNA, RN Outcome: Progressing   Problem: Health Behavior/Discharge Planning: Goal: Ability to identify and utilize available resources and services will improve 08/07/2024 0823 by Billee Marybelle Ragena SHAUNNA, RN Outcome: Progressing 08/07/2024 0823 by Billee Marybelle Ragena SHAUNNA, RN Outcome: Progressing Goal: Ability to manage health-related needs will improve 08/07/2024 0823 by Billee Marybelle Ragena SHAUNNA, RN Outcome: Progressing 08/07/2024 0823 by Billee Marybelle Ragena SHAUNNA, RN Outcome: Progressing   Problem: Metabolic: Goal: Ability to maintain appropriate glucose levels will improve 08/07/2024 0823 by Billee Marybelle Ragena SHAUNNA, RN Outcome: Progressing 08/07/2024 0823 by Billee Marybelle Ragena SHAUNNA, RN Outcome: Progressing   Problem: Nutritional: Goal: Maintenance of adequate nutrition will improve 08/07/2024 0823 by Billee Marybelle Ragena SHAUNNA, RN Outcome: Progressing 08/07/2024 0823 by Billee Marybelle Ragena SHAUNNA, RN Outcome: Progressing Goal: Progress toward achieving an optimal weight will improve 08/07/2024 0823 by Billee Marybelle Ragena SHAUNNA, RN Outcome: Progressing 08/07/2024 0823 by Billee Marybelle Ragena SHAUNNA, RN Outcome: Progressing   Problem: Skin Integrity: Goal: Risk for impaired skin integrity will decrease 08/07/2024 0823 by Billee Marybelle Ragena SHAUNNA, RN Outcome: Progressing 08/07/2024 0823 by Billee Marybelle Ragena SHAUNNA, RN Outcome: Progressing   Problem: Tissue Perfusion: Goal: Adequacy of tissue perfusion will improve 08/07/2024 0823 by Billee Marybelle Ragena SHAUNNA, RN Outcome:  Progressing 08/07/2024 0823 by Billee Marybelle Ragena SHAUNNA, RN Outcome: Progressing

## 2024-08-07 NOTE — Assessment & Plan Note (Addendum)
 HGB down from 7.8 > 7.3 this AM.  No sign of active bleeding.  - AM CBC - Transfusion threshold <7 unless symptomatic

## 2024-08-07 NOTE — Progress Notes (Signed)
  [facility] Progress Note   Subjective:    Seen in room No new c/o's  Objective Vitals:   08/07/24 0435 08/07/24 0708 08/07/24 0740 08/07/24 1436  BP: (!) 150/72  (!) 159/75 (!) 139/59  Pulse: 69  71 78  Resp: 15  17 17   Temp: 97.7 F (36.5 C)  97.6 F (36.4 C) 97.8 F (36.6 C)  TempSrc: Oral  Oral Oral  SpO2: 98% 97% 98% 99%  Weight:      Height:       Physical Exam General:  Chronically ill appearing man, NAD Heart: RRR; no murmur Lungs: CTA anteriorly Abdomen: soft Extremities: R BKA, no LLE edema - skin shriveled Dialysis Access: TDC in R chest, LUE AVF +t/b   Dialysis Orders TTS - [facility] 4:79min, BFR 400, EDW 100.4kg, 2K bath, TDC + AVF, heparin  2000 - last HD 9/6, post-HD weight 101.6kg - Mircera IV q 2 weeks (last 9/6)  Assessment/Plan: R knee abscess: Cx NGTD, on Vanc/ceftriaxone  here, s/p OR 9/12 with Dr. Harden wash out.  HD access: TDC with limb crack, s/p exchange with IR 9/8, has developing LUE AVF +t/b ESRD: Continue HD on TTS schedule - HD next Tues   HTN/volume: BP can be very labile - looking acceptable in the past 12h, no changes to meds at this time.  Anemia of ESRD: Hgb 7-8s - not due for ESA yet; no IV iron  in setting of serious infection. Secondary HPTH: Ca/Phos ok - continue home meds. T2DM  Ok for d/c from nephrology perspective when medically ready.  Myer Fret  MD  [facility] 08/07/2024, 4:03 PM  Recent Labs  Lab 08/06/24 0714 08/07/24 0547  HGB 7.8* 7.3*  ALBUMIN  2.9* 2.5*  CALCIUM  8.2* 7.7*  PHOS 5.1* 6.0*  CREATININE 6.87* 8.25*  K 5.0 6.0*    Inpatient medications:  acetaminophen   1,000 mg Oral Q6H   Or   acetaminophen   650 mg Rectal Q6H   atorvastatin   40 mg Oral Daily   budesonide -glycopyrrolate -formoterol   2 puff Inhalation BID   carvedilol   12.5 mg Oral BID WC   Chlorhexidine  Gluconate Cloth  6 each Topical Q0600   diltiazem   300 mg Oral Daily   FLUoxetine   40 mg Oral Daily   furosemide   40 mg Oral  BID   gabapentin   300 mg Oral Daily   heparin   5,000 Units Subcutaneous Q8H   hydrALAZINE   200 mg Oral BID   insulin  aspart  0-6 Units Subcutaneous TID WC   insulin  glargine  12 Units Subcutaneous Daily   pantoprazole   40 mg Oral Daily   sodium chloride  flush  10-40 mL Intracatheter Q12H   sodium zirconium cyclosilicate   10 g Oral Daily   tamsulosin   0.4 mg Oral Daily    cefTRIAXone  (ROCEPHIN )  IV Stopped (08/06/24 2259)   vancomycin  1,000 mg (08/05/24 1337)   albuterol , guaiFENesin , oxyCODONE  **OR** oxyCODONE , sodium chloride  flush

## 2024-08-07 NOTE — Progress Notes (Signed)
 Inpatient Rehab Admissions Coordinator:   Consult received and chart reviewed.  Pt admitted for surgical management of R popliteal fossa abscess.  He is on IV vancomycin  and ceftriaxone .  Therapy evaluations completed yesterday and pt was guarding assist for all mobility and ADLs.  At this time I do not believe his [facility] Medicare will approve CIR for his diagnosis or his current level of function.  Will need to pursue other rehab venues.  I will sign off.    Reche Lowers, PT, DPT Admissions Coordinator [phone] 08/07/24  9:16 AM

## 2024-08-07 NOTE — Assessment & Plan Note (Addendum)
 Uncontrolled, CBGs 300s overnight. A1C 7.3 05/2024.  - Very sensitive SSI - Lantus  16 units total today. Will dose 4 u  for a total  of 20u today. Will change dose to 20u daily starting tomorrow. - CBG QID

## 2024-08-07 NOTE — Assessment & Plan Note (Addendum)
 POD2 excision of encapsulated hematoma abscess popliteal fossa R knee by Dr. Harden. Pt reporting consistent 9/10 pain, but chart review shows that pt is not getting his PRN medication for 7+ hours at a time. - Continue Vancomycin  T/Th/S with HD (9/9-) - Continue Ceftriaxone , transition to Ceftazidine at discharge (9/6-9/19) - Pain regimen: Tylenol  1000mg  q6h, Oxycodone  5-7.5mg  q4h PRN - Reminded patient that he can get his pain medication more often. - NWB on R stump - no prostheses for two weeks - PT/OT eval - pt unable to safely go home without prosthesis - discussed with PT that the patient is afraid of falling and hurting himself, which is why he is apprehensive to work with them.   - Fall precautions

## 2024-08-07 NOTE — Progress Notes (Addendum)
 Daily Progress Note Intern Pager: [phone]  Patient name: [name] [name] Medical record number: [id number] Date of birth: 1971/07/18 Age: 53 y.o. Gender: male  Primary Care Provider: Adele Song, MD Consultants: VVS, IR, Ortho Code Status: Full  Pt Overview and Major Events to Date:  9/6 - Admitted 9/8 - TDC exchange by IR 9/12 - excision of encapsulated hematoma abscess popliteal fossa R knee by Dr. Harden  Medical Decision Making:  [name] [name] is a 53 y.o. 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 CT knee 9/6 with soft tissue thickening suspicious for subQ abscess. S/p knee aspiration performed by EDP and I&D performed by ortho. Receiving broad IV abx with no growth on aspiration culture.  Assessment & Plan Abscess of right lower extremity POD2 excision of encapsulated hematoma abscess popliteal fossa R knee by Dr. Harden. Pt reporting consistent 9/10 pain, but chart review shows that pt is not getting his PRN medication for 7+ hours at a time. - Continue Vancomycin  T/Th/S with HD (9/9-) - Continue Ceftriaxone , transition to Ceftazidine at discharge (9/6-9/19) - Pain regimen: Tylenol  1000mg  q6h, Oxycodone  5-7.5mg  q4h PRN - Reminded patient that he can get his pain medication more often. - NWB on R stump - no prostheses for two weeks - PT/OT eval - pt unable to safely go home without prosthesis - discussed with PT that the patient is afraid of falling and hurting himself, which is why he is apprehensive to work with them.   - Fall precautions ESRD on hemodialysis (HCC) Mechanical complication of dialysis catheter (HCC) HD T/Th/S. S/p TDC exchange with IR 9/8. Hyperkalemic today.  - Gave Lokelma  10 g this AM - Nephrology following, appreciate recommendations, continue renal diet - am RFP Uncontrolled type 2 diabetes mellitus with hyperglycemia, with long-term current use  of insulin  (HCC) Uncontrolled, CBGs 300s overnight. A1C 7.3 05/2024.  - Very sensitive SSI - Lantus  16 units total today. Will dose 4 u  for a total  of 20u today. Will change dose to 20u daily starting tomorrow. - CBG QID Severe hypertension Stable and at patient's baseline of 140s-150s systolic BP.  - Continue Coreg  12.5mg  BID, Hydralazine  200mg  BID, Losartan  25mg  daily, Diltiazem  300mg  daily Anemia of chronic disease HGB down from 7.8 > 7.3 this AM.  No sign of active bleeding.  - AM CBC - Transfusion threshold <7 unless symptomatic  Chronic health problem GERD - continue home pantoprazole  HLD / PAD - continue home Lipitor Depression - continue home fluoxetine  HFpEF - continue home Lasix  40mg  BID   FEN/GI: Carb Modified PPx: Heparin  Dispo:SNF pending PT/OT and ortho clearance.   Subjective:  Pt states he is doing ok. Reports 9/10 leg pain. Denies any abdominal pain, nausea, fever. Denies chest pain. Endorses shortness of breath. Pt states he is uncomfortable working with PT because he feels unsafe.   Objective: Temp:  [97.5 F (36.4 C)-97.8 F (36.6 C)] 97.7 F (36.5 C) (09/15 0435) Pulse Rate:  [69-88] 69 (09/15 0435) Resp:  [15-18] 15 (09/15 0435) BP: (135-152)/(66-79) 150/72 (09/15 0435) SpO2:  [95 %-99 %] 97 % (09/15 0708) Physical Exam: General: nontoxic appearing, NAD.  Cardiovascular: regular rate and rhythm Respiratory: lungs clear to auscultation bilaterally. Breath sounds in all lobes without wheezes, crackles, or rhonchi. Abdomen: soft, nontender, nondistended Extremities: normal movement throughout. Surgical dressing around stump clean and dry.  Laboratory:  Most recent CBC Lab Results  Component Value Date   WBC 8.0 08/07/2024   HGB 7.3 (L) 08/07/2024   HCT 25.5 (L) 08/07/2024   MCV 75.0 (L) 08/07/2024   PLT 162 08/07/2024   Most recent BMP    Latest Ref Rng & Units 08/07/2024    5:47 AM  BMP  Glucose 70 - 99 mg/dL 741   BUN 6 - 20 mg/dL 65    Creatinine 9.38 - 1.24 mg/dL 1.74   Sodium 864 - 854 mmol/L 132   Potassium 3.5 - 5.1 mmol/L 6.0   Chloride 98 - 111 mmol/L 93   CO2 22 - 32 mmol/L 24   Calcium  8.9 - 10.3 mg/dL 7.7     Imaging/Diagnostic Tests: No new imaging.   Benjamine [name], Medical Student 08/07/2024, 7:17 AM  MS4 AI, [facility] FPTS Intern pager: [phone], text pages welcome Secure chat group [facility] Teaching Service   I agree with the assessment and plan as documented above.  Stuart Redo, MD PGY-3, [facility]

## 2024-08-07 NOTE — Assessment & Plan Note (Addendum)
 GERD - continue home pantoprazole  HLD / PAD - continue home Lipitor Depression - continue home fluoxetine  HFpEF - continue home Lasix  40mg  BID

## 2024-08-07 NOTE — TOC Initial Note (Addendum)
 Transition of Care [facility]) - Initial/Assessment Note    Patient Details  Name: [name] [name] MRN: [id number] Date of Birth: August 23, 1971  [facility]) CM/SW Contact:    Bridget Cordella Simmonds, LCSW Phone Number: 08/07/2024, 1:48 PM  Clinical Narrative:   CSW spoke with pt regarding PT recommendation for SNF.  After some initial questions, pt does agree that he needs SNF at this time, permission given to send out referral in the hub.  Pt from home with mother and sister.  Permission NOT given to speak with them, then nobody talks to me.      No current home services.  HD pt.  Referral sent out in hub for SNF.             Expected Discharge Plan: Skilled Nursing Facility Barriers to Discharge: Continued Medical Work up, SNF Pending bed offer   Patient Goals and CMS Choice Patient states their goals for this hospitalization and ongoing recovery are:: walk with prosthetic          Expected Discharge Plan and Services In-house Referral: Clinical Social Work   Post Acute Care Choice: Skilled Nursing Facility Living arrangements for the past 2 months: Single Family Home                                      Prior Living Arrangements/Services Living arrangements for the past 2 months: Single Family Home Lives with:: Parents, Siblings (lives with mother and sister) Patient language and need for interpreter reviewed:: Yes Do you feel safe going back to the place where you live?: Yes      Need for Family Participation in Patient Care: Yes (Comment) Care giver support system in place?: Yes (comment) Current home services: Other (comment) (none) Criminal Activity/Legal Involvement Pertinent to Current Situation/Hospitalization: No - Comment as needed  Activities of Daily Living   ADL Screening (condition at time of admission) Independently performs ADLs?: Yes (appropriate for developmental age) Is the patient deaf or have difficulty hearing?: No Does the  patient have difficulty seeing, even when wearing glasses/contacts?: No Does the patient have difficulty concentrating, remembering, or making decisions?: No  Permission Sought/Granted Permission sought to share information with : Family Supports Permission granted to share information with : No (permission not given)     Permission granted to share info w AGENCY: SNF        Emotional Assessment Appearance:: Appears stated age Attitude/Demeanor/Rapport: Engaged Affect (typically observed): Appropriate Orientation: : Oriented to Self, Oriented to Place, Oriented to  Time, Oriented to Situation      Admission diagnosis:  Abscess of right knee [L02.415] Mechanical complication of dialysis catheter, initial encounter (HCC) [T82.49XA] Abscess of right lower extremity [L02.415] Patient Active Problem List   Diagnosis Date Noted   Mechanical complication of dialysis catheter (HCC) 07/29/2024   Abscess of right lower extremity 07/29/2024   Chronic health problem 07/29/2024   ESRD on hemodialysis (HCC) 07/29/2024   Anemia of chronic disease 07/29/2024   Nausea and vomiting 01/26/2024   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/25/2024   Depression 01/24/2024   Nonimmune to hepatitis B virus 10/22/2023   Urinary retention 10/14/2023   Closed intertrochanteric fracture of right hip (HCC) 10/13/2023   Contracture, 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   Pleural effusion 02/16/2023   Allergic rhinitis 11/07/2022   Tobacco dependence 09/11/2022   ESRD (end stage renal disease) on dialysis (HCC) 09/11/2022   GERD  with esophagitis 09/02/2021   Acute kidney injury superimposed on CKD (HCC) 04/10/2021   Achilles tendon contracture, left 03/03/2018   Non-pressure chronic ulcer of other part of left foot limited to breakdown of skin (HCC) 02/02/2018   History of right below knee amputation (HCC) 12/22/2017   Acute posthemorrhagic anemia    Falls 11/24/2017   Severe hypertension 11/24/2017   Anemia 11/24/2017   Uncontrolled  type 2 diabetes mellitus with hyperglycemia, with long-term current use of insulin  (HCC) 10/11/2017   ARF (acute renal failure) (HCC) 10/11/2017   PCP:  Adele Song, MD Pharmacy:   [facility] [phone] - [place], [place] - [address] AT [address] [place] [address] Phone: [phone] Fax: [phone]  [facility] [address] [place] [address] Phone: [phone] Fax: [phone]     Social Drivers of Health (SDOH) Social History: SDOH Screenings   Food Insecurity: Food Insecurity Present (07/30/2024)  Housing: Low Risk  (07/30/2024)  Transportation Needs: No Transportation Needs (07/30/2024)  Utilities: Not At Risk (07/30/2024)  Alcohol Screen: Low Risk  (06/08/2023)  Depression (PHQ2-9): High Risk (07/21/2024)  Financial Resource Strain: High Risk (10/30/2023)  Physical Activity: Unknown (10/30/2023)  Social Connections: Patient Declined (01/23/2024)  Recent Concern: Social Connections - Socially Isolated (01/23/2024)  Stress: Stress Concern Present (07/17/2024)  Tobacco Use: High Risk (08/04/2024)  Health Literacy: Inadequate Health Literacy (06/08/2023)   SDOH Interventions:     Readmission Risk Interventions    01/25/2024    2:16 PM  Readmission Risk Prevention Plan  Transportation Screening Complete  PCP or Specialist Appt within 5-7 Days Complete  Home Care Screening Complete  Medication Review (RN CM) Referral to Pharmacy

## 2024-08-07 NOTE — Progress Notes (Signed)
 Patient ID: [name] [name], male   DOB: September 29, 1971, 53 y.o.   MRN: [id number] Examination the right knee dressing is clean dry and intact.  Will leave the dressing on in place for 1 week.  Patient states he feels safest with discharge to skilled nursing.

## 2024-08-08 DIAGNOSIS — L02415 Cutaneous abscess of right lower limb: Secondary | ICD-10-CM | POA: Diagnosis not present

## 2024-08-08 DIAGNOSIS — Z794 Long term (current) use of insulin: Secondary | ICD-10-CM | POA: Diagnosis not present

## 2024-08-08 DIAGNOSIS — E118 Type 2 diabetes mellitus with unspecified complications: Secondary | ICD-10-CM | POA: Diagnosis not present

## 2024-08-08 LAB — RENAL FUNCTION PANEL
Albumin: 2.6 g/dL — ABNORMAL LOW (ref 3.5–5.0)
Anion gap: 14 (ref 5–15)
BUN: 80 mg/dL — ABNORMAL HIGH (ref 6–20)
CO2: 22 mmol/L (ref 22–32)
Calcium: 7.7 mg/dL — ABNORMAL LOW (ref 8.9–10.3)
Chloride: 93 mmol/L — ABNORMAL LOW (ref 98–111)
Creatinine, Ser: 9.82 mg/dL — ABNORMAL HIGH (ref 0.61–1.24)
GFR, Estimated: 6 mL/min — ABNORMAL LOW (ref >60)
Glucose, Bld: 213 mg/dL — ABNORMAL HIGH (ref 70–99)
Phosphorus: 7.3 mg/dL — ABNORMAL HIGH (ref 2.5–4.6)
Potassium: 5.8 mmol/L — ABNORMAL HIGH (ref 3.5–5.1)
Sodium: 129 mmol/L — ABNORMAL LOW (ref 135–145)

## 2024-08-08 LAB — CBC
HCT: 25.4 % — ABNORMAL LOW (ref 39.0–52.0)
Hemoglobin: 7.3 g/dL — ABNORMAL LOW (ref 13.0–17.0)
MCH: 21.5 pg — ABNORMAL LOW (ref 26.0–34.0)
MCHC: 28.7 g/dL — ABNORMAL LOW (ref 30.0–36.0)
MCV: 74.9 fL — ABNORMAL LOW (ref 80.0–100.0)
Platelets: 162 K/uL (ref 150–400)
RBC: 3.39 MIL/uL — ABNORMAL LOW (ref 4.22–5.81)
RDW: 19.6 % — ABNORMAL HIGH (ref 11.5–15.5)
WBC: 7.5 K/uL (ref 4.0–10.5)
nRBC: 0.3 % — ABNORMAL HIGH (ref 0.0–0.2)

## 2024-08-08 LAB — GLUCOSE, CAPILLARY
Glucose-Capillary: 222 mg/dL — ABNORMAL HIGH (ref 70–99)
Glucose-Capillary: 276 mg/dL — ABNORMAL HIGH (ref 70–99)
Glucose-Capillary: 172 mg/dL — ABNORMAL HIGH (ref 70–99)
Glucose-Capillary: 262 mg/dL — ABNORMAL HIGH (ref 70–99)

## 2024-08-08 MED ORDER — PENTAFLUOROPROP-TETRAFLUOROETH EX AERO: 1.0000 | INHALATION_SPRAY | CUTANEOUS | Status: DC | PRN

## 2024-08-08 MED ORDER — LIDOCAINE-PRILOCAINE 2.5-2.5 % EX CREA: 1.0000 | TOPICAL_CREAM | CUTANEOUS | Status: DC | PRN

## 2024-08-08 MED ORDER — ONDANSETRON HCL 4 MG/2ML IJ SOLN
4.0000 mg | INTRAMUSCULAR | Status: AC | PRN
  Administered 2024-08-08: 4 mg via INTRAVENOUS

## 2024-08-08 MED ORDER — HEPARIN SODIUM (PORCINE) 1000 UNIT/ML DIALYSIS
2000.0000 [IU] | Freq: Once | INTRAMUSCULAR | Status: AC
  Administered 2024-08-08: 2000 [IU] via INTRAVENOUS_CENTRAL

## 2024-08-08 MED ORDER — ANTICOAGULANT SODIUM CITRATE 4% (200MG/5ML) IV SOLN: 5.0000 mL | Status: DC | PRN

## 2024-08-08 MED ORDER — HEPARIN SODIUM (PORCINE) 1000 UNIT/ML DIALYSIS: 1000.0000 [IU] | INTRAMUSCULAR | Status: DC | PRN

## 2024-08-08 MED ORDER — HEPARIN SODIUM (PORCINE) 1000 UNIT/ML IJ SOLN
INTRAMUSCULAR | Status: AC
  Filled 2024-08-08: qty 2

## 2024-08-08 MED ORDER — INSULIN GLARGINE 100 UNIT/ML ~~LOC~~ SOLN
10.0000 [IU] | Freq: Once | SUBCUTANEOUS | Status: DC
  Filled 2024-08-08: qty 0.1

## 2024-08-08 MED ORDER — INSULIN GLARGINE 100 UNIT/ML ~~LOC~~ SOLN
10.0000 [IU] | Freq: Once | SUBCUTANEOUS | Status: AC
  Administered 2024-08-08: 10 [IU] via SUBCUTANEOUS
  Filled 2024-08-08: qty 0.1

## 2024-08-08 MED ORDER — HEPARIN SODIUM (PORCINE) 1000 UNIT/ML IJ SOLN
INTRAMUSCULAR | Status: AC
  Filled 2024-08-08: qty 4

## 2024-08-08 MED ORDER — SENNA 8.6 MG PO TABS
1.0000 | ORAL_TABLET | Freq: Every day | ORAL | Status: DC
  Filled 2024-08-08 (×2): qty 1

## 2024-08-08 MED ORDER — ONDANSETRON HCL 4 MG/2ML IJ SOLN
INTRAMUSCULAR | Status: AC
Start: 2024-08-08 — End: 2024-08-08
  Filled 2024-08-08: qty 2

## 2024-08-08 MED ORDER — ALTEPLASE 2 MG IJ SOLR: 2.0000 mg | Freq: Once | INTRAMUSCULAR | Status: DC | PRN

## 2024-08-08 MED ORDER — LIDOCAINE HCL (PF) 1 % IJ SOLN: 5.0000 mL | INTRAMUSCULAR | Status: DC | PRN

## 2024-08-08 MED ORDER — OXYCODONE HCL 5 MG PO TABS: 7.5000 mg | ORAL_TABLET | ORAL | Status: DC | PRN

## 2024-08-08 MED ORDER — POLYETHYLENE GLYCOL 3350 17 G PO PACK
17.0000 g | PACK | Freq: Every day | ORAL | Status: DC
  Filled 2024-08-08 (×2): qty 1

## 2024-08-08 MED ORDER — OXYCODONE HCL 5 MG PO TABS
10.0000 mg | ORAL_TABLET | ORAL | Status: DC | PRN
  Administered 2024-08-08 – 2024-08-09 (×4): 10 mg via ORAL
  Filled 2024-08-08 (×4): qty 2

## 2024-08-08 MED ORDER — INSULIN GLARGINE 100 UNIT/ML ~~LOC~~ SOLN
22.0000 [IU] | Freq: Every day | SUBCUTANEOUS | Status: DC
  Administered 2024-08-09: 22 [IU] via SUBCUTANEOUS
  Filled 2024-08-08: qty 0.22

## 2024-08-08 NOTE — Assessment & Plan Note (Addendum)
 Stable and at patient's baseline of 140s-150s systolic BP.  - Continue Coreg  12.5mg  BID, Hydralazine  200mg  BID, Diltiazem  300mg  daily - holding Losartan  25mg  daily as above

## 2024-08-08 NOTE — Progress Notes (Signed)
  [facility] Progress Note   Subjective:    Seen in room Explained high K+ foods, says he doesn't know anything about it  Objective Vitals:   08/08/24 1430 08/08/24 1500 08/08/24 1530 08/08/24 1600  BP: (!) 188/79 (!) 184/77 (!) 181/72 (!) 192/83  Pulse: 84 83 81 82  Resp: 19 10 13 16   Temp:      TempSrc:      SpO2: 99% 97% 96% 97%  Weight:      Height:       Physical Exam General:  Chronically ill appearing man, NAD Heart: RRR; no murmur Lungs: CTA anteriorly Abdomen: soft Extremities: R BKA, no LLE edema - skin shriveled Dialysis Access: TDC in R chest, LUE AVF +t/b   Dialysis Orders TTS [facility] 4h  B400    100.4kg   2K bath   TDC + AVF   Heparin  2000 - last HD 9/6, post-HD weight 101.6kg - Mircera 225mcg IV q 2 weeks (last 9/6)  Assessment/Plan: R knee abscess: Cx NGTD, on Vanc/ceftriaxone  here, s/p OR 9/12 with Dr. Harden wash out.  HD access: TDC with limb crack, s/p exchange with IR 9/8, has developing LUE AVF +t/b ESRD: Continue HD on TTS schedule - HD next Tues   HTN/volume: BP can be very labile - looking acceptable in the past 12h, no changes to meds at this time.  Anemia of ESRD: Hgb 7-8s - not due for ESA yet; no IV iron  in setting of serious infection. Secondary HPTH: Ca/Phos ok - continue home meds. T2DM Hyperkalemia: cautioned pt who refuses renal diet about high K+ foods. Lokelma  10gm daily. Low K+ bath w/ HD today.   Myer Fret  MD  [facility] 08/08/2024, 4:25 PM  Recent Labs  Lab 08/07/24 0547 08/08/24 0633  HGB 7.3* 7.3*  ALBUMIN  2.5* 2.6*  CALCIUM  7.7* 7.7*  PHOS 6.0* 7.3*  CREATININE 8.25* 9.82*  K 6.0* 5.8*    Inpatient medications:  acetaminophen   1,000 mg Oral Q6H   Or   acetaminophen   650 mg Rectal Q6H   atorvastatin   40 mg Oral Daily   budesonide -glycopyrrolate -formoterol   2 puff Inhalation BID   carvedilol   12.5 mg Oral BID WC   Chlorhexidine  Gluconate Cloth  6 each Topical Q0600   diltiazem   300 mg Oral Daily    FLUoxetine   40 mg Oral Daily   furosemide   40 mg Oral BID   gabapentin   300 mg Oral Daily   heparin   5,000 Units Subcutaneous Q8H   hydrALAZINE   200 mg Oral BID   insulin  aspart  0-6 Units Subcutaneous TID WC   insulin  glargine  10 Units Subcutaneous Once   [START ON 08/09/2024] insulin  glargine  22 Units Subcutaneous Daily   pantoprazole   40 mg Oral Daily   polyethylene glycol  17 g Oral Daily   senna  1 tablet Oral Daily   sodium chloride  flush  10-40 mL Intracatheter Q12H   sodium zirconium cyclosilicate   10 g Oral Daily   tamsulosin   0.4 mg Oral Daily    anticoagulant sodium citrate      cefTRIAXone  (ROCEPHIN )  IV Stopped (08/07/24 2110)   vancomycin  1,000 mg (08/05/24 1337)   albuterol , alteplase , anticoagulant sodium citrate , guaiFENesin , heparin , [START ON 08/09/2024] heparin , lidocaine  (PF), lidocaine -prilocaine , ondansetron  (ZOFRAN ) IV, oxyCODONE  **OR** oxyCODONE , pentafluoroprop-tetrafluoroeth, sodium chloride  flush

## 2024-08-08 NOTE — Progress Notes (Addendum)
 Daily Progress Note Intern Pager: [phone]  Patient name: [name] [name] Medical record number: [id number] Date of birth: 02/19/1971 Age: 53 y.o. Gender: male  Primary Care Provider: Adele Song, MD Consultants: Nephrology, orthopedics Code Status: Full  Pt Overview and Major Events to Date:  9/6 - Admitted 9/8 - TDC exchange by IR 9/12 - excision of encapsulated hematoma abscess popliteal fossa R knee by Dr. Harden  Medical Decision Making:  [name] [name] is a 53 y.o. 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 CT knee 9/6 with soft tissue thickening suspicious for subQ abscess. S/p knee aspiration performed by EDP and I&D performed by ortho. Receiving broad IV abx with no growth on aspiration culture.  Assessment & Plan Abscess of right lower extremity POD2 excision of encapsulated hematoma abscess popliteal fossa R knee by Dr. Harden. Pt reporting slight improvement of pain to  8/10 pain while taking medication q4h. - Continue Vancomycin  T/Th/S with HD (9/9-) - Continue Ceftriaxone , transition to Ceftazidine at discharge (9/6 - 9/19) - Pain regimen: Tylenol  1000mg  q6h scheduled, increased Oxycodone  5-7.5mg  q4h PRN > Oxycodone  7.5mg -10mg  q4h PRN for moderate-severe pain. - NWB on R stump - no prostheses for two weeks - SW met with patient who has chosen a SNF. Pending insurance authorization   - Fall precautions ESRD on hemodialysis [facility]) Mechanical complication of dialysis catheter (HCC) HD T/Th/S. S/p TDC exchange with IR 9/8. Hyperkalemic today to 5.8, received Lokelma  10g.  - Nephrology following, appreciate recommendations, continue renal diet - Holding Losartan  pending hyperkalemia - AM RFP Uncontrolled type 2 diabetes mellitus with hyperglycemia, with long-term current use of insulin  (HCC) Uncontrolled, CBGs 300s overnight. A1C 7.3 05/2024.  - Very sensitive SSI -  Got a total of 20u Lantus  yesterday. Given 12u this morning. Will add 10u today and change to Lantus  22 daily starting tomorrow - CBG ACHS Severe hypertension Stable and at patient's baseline of 140s-150s systolic BP.  - Continue Coreg  12.5mg  BID, Hydralazine  200mg  BID, Diltiazem  300mg  daily - holding Losartan  25mg  daily as above Anemia of chronic disease HGB stable at 7.3 this AM.  No sign of active bleeding.  - AM CBC - Transfusion threshold <7 unless symptomatic  Chronic health problem GERD - continue home pantoprazole  HLD / PAD - continue home Lipitor Depression - continue home fluoxetine  HFpEF - continue home Lasix  40mg  BID    FEN/GI: carb and renal modified PPx: heparin  Dispo:SNF today or tomorrow.   Subjective:  Pt states that he is feeling ok. Reports some congestion with breathing. He anticipates that HD today with improve this sensation. He states that today his pain is an 8/10 after getting his medications Q4h.   Objective: Temp:  [97.5 F (36.4 C)-97.8 F (36.6 C)] 97.5 F (36.4 C) (09/16 0819) Pulse Rate:  [60-80] 60 (09/16 0819) Resp:  [17] 17 (09/16 0819) BP: (129-165)/(57-83) 129/57 (09/16 0819) SpO2:  [99 %-100 %] 99 % (09/16 0819) Physical Exam: General: NAD. Nontoxic appearing Cardiovascular: regular rate and rhythm Respiratory: good air movement throughout. No wheezes or crackles Extremities: moving all limbs normally. Bandaging to RLE clean and dry.  Laboratory: Most recent CBC Lab Results  Component Value Date   WBC 7.5 08/08/2024   HGB 7.3 (L) 08/08/2024   HCT 25.4 (L) 08/08/2024   MCV 74.9 (L) 08/08/2024   PLT 162 08/08/2024   Most recent  BMP    Latest Ref Rng & Units 08/08/2024    6:33 AM  BMP  Glucose 70 - 99 mg/dL 786   BUN 6 - 20 mg/dL 80   Creatinine 9.38 - 1.24 mg/dL 0.17   Sodium 864 - 854 mmol/L 129   Potassium 3.5 - 5.1 mmol/L 5.8   Chloride 98 - 111 mmol/L 93   CO2 22 - 32 mmol/L 22   Calcium  8.9 - 10.3 mg/dL 7.7      Imaging/Diagnostic Tests: No new imaging  [name] [name] DASEN, Medical Student 08/08/2024, 9:19 AM  MS$ AI, [facility] FPTS Intern pager: [phone], text pages welcome Secure chat group [facility] [place] Teaching Service    I have discussed the above with Student Doctor [name] and agree with the documented plan. My edits for correction/addition/clarification are included above. Please see any attending notes.   Kathrine Melena, DO PGY-2, [facility] 08/08/2024 3:25 PM

## 2024-08-08 NOTE — Assessment & Plan Note (Addendum)
 HD T/Th/S. S/p TDC exchange with IR 9/8. Hyperkalemic today to 5.8, received Lokelma  10g.  - Nephrology following, appreciate recommendations, continue renal diet - Holding Losartan  pending hyperkalemia - AM RFP

## 2024-08-08 NOTE — Assessment & Plan Note (Addendum)
 POD2 excision of encapsulated hematoma abscess popliteal fossa R knee by Dr. Harden. Pt reporting slight improvement of pain to  8/10 pain while taking medication q4h. - Continue Vancomycin  T/Th/S with HD (9/9-) - Continue Ceftriaxone , transition to Ceftazidine at discharge (9/6 - 9/19) - Pain regimen: Tylenol  1000mg  q6h scheduled, increased Oxycodone  5-7.5mg  q4h PRN > Oxycodone  7.5mg -10mg  q4h PRN for moderate-severe pain. - NWB on R stump - no prostheses for two weeks - SW met with patient who has chosen a SNF. Pending insurance authorization   - Fall precautions

## 2024-08-08 NOTE — Progress Notes (Signed)
 Physical Therapy Treatment Patient Details Name: [name] [name] MRN: [id number] DOB: 1971/05/04 Today's Date: 08/08/2024   History of Present Illness [name] [name] is a 53 y.o. male admitted 07/29/24 for right popliteal fossa abscess who failed conservative measures and elected for surgical management. Pt s/p excisional debridement 9/12. PMHx: HTN, PVD s/p R BKA and L great toe amputation, SVT, GERD, depression, T2DM, and ESRD on HD.    PT Comments  Pt received in bed with RLE elevated, R knee in partial flexion. Began with amputee exercises for strengthening and ROM and education given on maintaining full ROM R knee in preparation for being able to wear prosthesis again. Pt needed increased assist today with STS and gait training compared to last visit. Needed mod A +2 to stand from significantly elevated surface. Pt attempted to hop fwd multiple bouts but was unable to lift full L foot, only heel and had posterior bias. Patient will benefit from continued inpatient follow up therapy, <3 hours/day. PT will continue to follow.     If plan is discharge home, recommend the following: A lot of help with walking and/or transfers;A lot of help with bathing/dressing/bathroom;Assistance with cooking/housework;Assist for transportation;Help with stairs or ramp for entrance   Can travel by private vehicle     Yes  Equipment Recommendations  None recommended by PT (Pt already has DME)    Recommendations for Other Services       Precautions / Restrictions Precautions Precautions: Fall Recall of Precautions/Restrictions: Impaired Restrictions Weight Bearing Restrictions Per Provider Order: Yes RLE Weight Bearing Per Provider Order: Non weight bearing Other Position/Activity Restrictions: cannot wear prosthesis for 2 weeks     Mobility  Bed Mobility Overal bed mobility: Needs Assistance Bed Mobility: Supine to Sit, Sit to Supine     Supine to sit: Supervision, Used rails Sit to  supine: Supervision   General bed mobility comments: Pt sat up on R side of bed with increased time. HOB flat. Heavy reliance on bedrails. No cues for sequencing. Pt returned to bed swinging BLE back in and placing pillow under RLE to elevate.    Transfers Overall transfer level: Needs assistance Equipment used: Rolling walker (2 wheels) Transfers: Sit to/from Stand Sit to Stand: +2 safety/equipment, From elevated surface, Mod assist           General transfer comment: pt raised bed himself to desired height, despite PT encouraging him to try lower height since chair and BSC in room not elevated. Needed mod A to power up, +2 safety. Practiced 3x for strengthening.    Ambulation/Gait Ambulation/Gait assistance: +2 safety/equipment, Min assist   Assistive device: Rolling walker (2 wheels) Gait Pattern/deviations: Step-to pattern       General Gait Details: pt unable to advance LLE today, kept rising up on L toes but could not lift LLE fwd. Raised walker to give him more push but still could not swing leg through, moved fwd only 2-3 inches. Pt frustrated after multiple bouts of trying. Encouraged him that he's getting strengthening even when he can't hop.   Stairs             Wheelchair Mobility     Tilt Bed    Modified Rankin (Stroke Patients Only)       Balance Overall balance assessment: Needs assistance Sitting-balance support: No upper extremity supported, Feet supported Sitting balance-Leahy Scale: Good Sitting balance - Comments: Pt sat EOB with supervision. He reached outside his BOS to don/doff tennis shoe on L foot.  Standing balance support: Bilateral upper extremity supported, During functional activity, Reliant on assistive device for balance Standing balance-Leahy Scale: Poor Standing balance comment: heavily dependent on RW                            Communication Communication Communication: No apparent difficulties  Cognition  Arousal: Alert Behavior During Therapy: Flat affect, Anxious, Impulsive   PT - Cognitive impairments: No family/caregiver present to determine baseline, Awareness, Problem solving, Safety/Judgement                       PT - Cognition Comments: Pt A,Ox4. He appears to have decreased insight into his current health situation. Discussed things that would be good for him like sitting in chair (which he refused to do) or at least sitting EOB, for meals. Pt relays that he does not want to do thongs that feel uncomfortable or hard Following commands: Impaired Following commands impaired: Follows one step commands with increased time, Follows multi-step commands with increased time    Cueing Cueing Techniques: Verbal cues, Gestural cues, Tactile cues, Visual cues  Exercises Amputee Exercises Gluteal Sets: AROM, Right, 10 reps, Supine Hip ABduction/ADduction: AROM, Right, 10 reps, Seated Knee Flexion: AROM, Right, 10 reps, Supine, Seated Knee Extension: AROM, Right, 10 reps, Supine, Seated Straight Leg Raises: AROM, Right, 10 reps, Supine    General Comments General comments (skin integrity, edema, etc.): VSS      Pertinent Vitals/Pain Pain Assessment Pain Assessment: Faces Faces Pain Scale: Hurts even more Pain Location: R knee Pain Descriptors / Indicators: Operative site guarding, Discomfort, Aching, Sore Pain Intervention(s): Limited activity within patient's tolerance, Monitored during session, Premedicated before session    Home Living                          Prior Function            PT Goals (current goals can now be found in the care plan section) Acute Rehab PT Goals Patient Stated Goal: Be able to use prosthesis again PT Goal Formulation: With patient Time For Goal Achievement: 08/20/24 Potential to Achieve Goals: Fair Progress towards PT goals: Not progressing toward goals - comment (weakness)    Frequency    Min 2X/week      PT Plan       Co-evaluation              AM-PAC PT 6 Clicks Mobility   Outcome Measure  Help needed turning from your back to your side while in a flat bed without using bedrails?: A Little Help needed moving from lying on your back to sitting on the side of a flat bed without using bedrails?: A Little Help needed moving to and from a bed to a chair (including a wheelchair)?: A Lot Help needed standing up from a chair using your arms (e.g., wheelchair or bedside chair)?: A Lot Help needed to walk in hospital room?: Total Help needed climbing 3-5 steps with a railing? : Total 6 Click Score: 12    End of Session Equipment Utilized During Treatment: Gait belt Activity Tolerance: Patient tolerated treatment well Patient left: in bed;with call bell/phone within reach;with bed alarm set Nurse Communication: Mobility status PT Visit Diagnosis: Other abnormalities of gait and mobility (R26.89);Difficulty in walking, not elsewhere classified (R26.2);Pain Pain - Right/Left: Right Pain - part of body: Knee     Time: 8884-8856  PT Time Calculation (min) (ACUTE ONLY): 28 min  Charges:    $Therapeutic Exercise: 8-22 mins $Therapeutic Activity: 8-22 mins PT General Charges $$ ACUTE PT VISIT: 1 Visit                     [name] [name], PT  Acute Rehab Services Secure chat preferred Office [phone]    [name] [name] [name] [name] 08/08/2024, 2:08 PM

## 2024-08-08 NOTE — Progress Notes (Signed)
  Received patient in bed to unit.   Informed consent signed and in chart.    TX duration:3     Transported by  Hand-off given to patient's nurse.    Access used: rt CVC Access issues: None Dressing changed   Total UF removed: 3.3 Medication(s) given:  zofran  iv Post HD VS: 195/77 Post HD weight: 113.9     Hunter Hacking LPN [facility]

## 2024-08-08 NOTE — Assessment & Plan Note (Addendum)
 GERD - continue home pantoprazole  HLD / PAD - continue home Lipitor Depression - continue home fluoxetine  HFpEF - continue home Lasix  40mg  BID

## 2024-08-08 NOTE — Assessment & Plan Note (Addendum)
 Uncontrolled, CBGs 300s overnight. A1C 7.3 05/2024.  - Very sensitive SSI - Got a total of 20u Lantus  yesterday. Given 12u this morning. Will add 10u today and change to Lantus  22 daily starting tomorrow - CBG ACHS

## 2024-08-08 NOTE — Inpatient Diabetes Management (Signed)
 Inpatient Diabetes Program Recommendations  AACE/ADA: New Consensus Statement on Inpatient Glycemic Control   Target Ranges:  Prepandial:   less than 140 mg/dL      Peak postprandial:   less than 180 mg/dL (1-2 hours)      Critically ill patients:  140 - 180 mg/dL    Latest Reference Range & Units 06/09/24 21:39  Hemoglobin A1C 4.8 - 5.6 % 7.3 (H)    Latest Reference Range & Units 08/07/24 06:31 08/07/24 11:25 08/07/24 16:21 08/07/24 21:16 08/08/24 06:17  Glucose-Capillary 70 - 99 mg/dL 630 (H) 733 (H) 818 (H) 185 (H) 222 (H)   Review of Glycemic Control  Diabetes history: DM2 Outpatient Diabetes medications: Humalog  0-6 units TID  Current orders for Inpatient glycemic control: Lantus  22 units daily and Novolog  0-6 units TID with meals.   Inpatient Diabetes Program Recommendations: Noted basal insulin  was increased to Lantus  22 units daily today. Patient will likely need basal insulin  at discharge.     Thanks,  Lavanda Search, RN, MSN, [facility]  Inpatient Diabetes Coordinator  Pager [phone] (8a-5p)

## 2024-08-08 NOTE — Assessment & Plan Note (Addendum)
 HGB stable at 7.3 this AM.  No sign of active bleeding.  - AM CBC - Transfusion threshold <7 unless symptomatic

## 2024-08-08 NOTE — Plan of Care (Signed)

## 2024-08-08 NOTE — TOC Progression Note (Addendum)
 Transition of Care [facility]) - Progression Note    Patient Details  Name: [name] [name] MRN: [id number] Date of Birth: 1971-04-02  [facility] CM/SW Contact  Bridget Cordella Simmonds, LCSW Phone Number: 08/08/2024, 8:46 AM  Clinical Narrative:  Bed offers provided to pt on medicare choice document.  He will accept offer at [facility].    CSW confirmed with [facility] that they can receive pt with approved auth.  SNF auth request submitted in [place].   [id number] Barrows approved: [id number], 3 days: 9/16-9/18.  CSW informed pt has changed mind about SNF facility, spoke with pt in room, he would like to change to [facility].  CSW informed Jame, confirmed with [facility] that they can also receive pt today.  1035: CSW spoke with Navi and they will change facility for current auth.   [id number] Joylene barrows: [id number], 3 days: 9/16-9/18.   Pt just transferred to HD in past 30 minutes, CSW confirmed with SNF that they can receive pt tomorrow.  Tracy/[facility] notified.   Expected Discharge Plan: Skilled Nursing Facility Barriers to Discharge: Continued Medical Work up, SNF Pending bed offer               Expected Discharge Plan and Services In-house Referral: Clinical Social Work   Post Acute Care Choice: Skilled Nursing Facility Living arrangements for the past 2 months: Single Family Home                                       Social Drivers of Health (SDOH) Interventions SDOH Screenings   Food Insecurity: Food Insecurity Present (07/30/2024)  Housing: Low Risk  (07/30/2024)  Transportation Needs: No Transportation Needs (07/30/2024)  Utilities: Not At Risk (07/30/2024)  Alcohol Screen: Low Risk  (06/08/2023)  Depression (PHQ2-9): High Risk (07/21/2024)  Financial Resource Strain: High Risk (10/30/2023)  Physical Activity: Unknown (10/30/2023)  Social Connections: Patient Declined (01/23/2024)  Recent Concern: Social Connections - Socially Isolated (01/23/2024)  Stress: Stress  Concern Present (07/17/2024)  Tobacco Use: High Risk (08/04/2024)  Health Literacy: Inadequate Health Literacy (06/08/2023)    Readmission Risk Interventions    01/25/2024    2:16 PM  Readmission Risk Prevention Plan  Transportation Screening Complete  PCP or Specialist Appt within 5-7 Days Complete  Home Care Screening Complete  Medication Review (RN CM) Referral to Pharmacy

## 2024-08-09 ENCOUNTER — Other Ambulatory Visit: Payer: Self-pay | Admitting: Family Medicine

## 2024-08-09 DIAGNOSIS — Z7409 Other reduced mobility: Secondary | ICD-10-CM | POA: Diagnosis not present

## 2024-08-09 DIAGNOSIS — N2581 Secondary hyperparathyroidism of renal origin: Secondary | ICD-10-CM | POA: Diagnosis not present

## 2024-08-09 DIAGNOSIS — J810 Acute pulmonary edema: Secondary | ICD-10-CM | POA: Diagnosis not present

## 2024-08-09 DIAGNOSIS — T8249XA Other complication of vascular dialysis catheter, initial encounter: Secondary | ICD-10-CM | POA: Diagnosis not present

## 2024-08-09 DIAGNOSIS — I471 Supraventricular tachycardia, unspecified: Secondary | ICD-10-CM | POA: Diagnosis not present

## 2024-08-09 DIAGNOSIS — M71061 Abscess of bursa, right knee: Secondary | ICD-10-CM | POA: Diagnosis not present

## 2024-08-09 DIAGNOSIS — L02415 Cutaneous abscess of right lower limb: Secondary | ICD-10-CM | POA: Diagnosis not present

## 2024-08-09 DIAGNOSIS — E46 Unspecified protein-calorie malnutrition: Secondary | ICD-10-CM | POA: Diagnosis not present

## 2024-08-09 DIAGNOSIS — M25561 Pain in right knee: Secondary | ICD-10-CM | POA: Diagnosis not present

## 2024-08-09 DIAGNOSIS — I1311 Hypertensive heart and chronic kidney disease without heart failure, with stage 5 chronic kidney disease, or end stage renal disease: Secondary | ICD-10-CM | POA: Diagnosis not present

## 2024-08-09 DIAGNOSIS — Z992 Dependence on renal dialysis: Secondary | ICD-10-CM | POA: Diagnosis not present

## 2024-08-09 DIAGNOSIS — R339 Retention of urine, unspecified: Secondary | ICD-10-CM | POA: Diagnosis not present

## 2024-08-09 DIAGNOSIS — I152 Hypertension secondary to endocrine disorders: Secondary | ICD-10-CM | POA: Diagnosis not present

## 2024-08-09 DIAGNOSIS — E1165 Type 2 diabetes mellitus with hyperglycemia: Secondary | ICD-10-CM | POA: Diagnosis not present

## 2024-08-09 DIAGNOSIS — W19XXXA Unspecified fall, initial encounter: Secondary | ICD-10-CM | POA: Diagnosis not present

## 2024-08-09 DIAGNOSIS — D638 Anemia in other chronic diseases classified elsewhere: Secondary | ICD-10-CM | POA: Diagnosis not present

## 2024-08-09 DIAGNOSIS — R001 Bradycardia, unspecified: Secondary | ICD-10-CM | POA: Diagnosis not present

## 2024-08-09 DIAGNOSIS — R051 Acute cough: Secondary | ICD-10-CM | POA: Diagnosis not present

## 2024-08-09 DIAGNOSIS — J449 Chronic obstructive pulmonary disease, unspecified: Secondary | ICD-10-CM | POA: Diagnosis not present

## 2024-08-09 DIAGNOSIS — N186 End stage renal disease: Secondary | ICD-10-CM | POA: Diagnosis not present

## 2024-08-09 DIAGNOSIS — D689 Coagulation defect, unspecified: Secondary | ICD-10-CM | POA: Diagnosis not present

## 2024-08-09 DIAGNOSIS — T8249XD Other complication of vascular dialysis catheter, subsequent encounter: Secondary | ICD-10-CM | POA: Diagnosis not present

## 2024-08-09 DIAGNOSIS — D631 Anemia in chronic kidney disease: Secondary | ICD-10-CM | POA: Diagnosis not present

## 2024-08-09 DIAGNOSIS — E8809 Other disorders of plasma-protein metabolism, not elsewhere classified: Secondary | ICD-10-CM | POA: Diagnosis not present

## 2024-08-09 DIAGNOSIS — M8580 Other specified disorders of bone density and structure, unspecified site: Secondary | ICD-10-CM | POA: Diagnosis not present

## 2024-08-09 DIAGNOSIS — E1140 Type 2 diabetes mellitus with diabetic neuropathy, unspecified: Secondary | ICD-10-CM | POA: Diagnosis not present

## 2024-08-09 DIAGNOSIS — M6281 Muscle weakness (generalized): Secondary | ICD-10-CM | POA: Diagnosis not present

## 2024-08-09 DIAGNOSIS — Z743 Need for continuous supervision: Secondary | ICD-10-CM | POA: Diagnosis not present

## 2024-08-09 DIAGNOSIS — Z89511 Acquired absence of right leg below knee: Secondary | ICD-10-CM | POA: Diagnosis not present

## 2024-08-09 DIAGNOSIS — I129 Hypertensive chronic kidney disease with stage 1 through stage 4 chronic kidney disease, or unspecified chronic kidney disease: Secondary | ICD-10-CM | POA: Diagnosis not present

## 2024-08-09 DIAGNOSIS — E1159 Type 2 diabetes mellitus with other circulatory complications: Secondary | ICD-10-CM | POA: Diagnosis not present

## 2024-08-09 DIAGNOSIS — R531 Weakness: Secondary | ICD-10-CM | POA: Diagnosis not present

## 2024-08-09 DIAGNOSIS — D509 Iron deficiency anemia, unspecified: Secondary | ICD-10-CM | POA: Diagnosis not present

## 2024-08-09 DIAGNOSIS — R262 Difficulty in walking, not elsewhere classified: Secondary | ICD-10-CM | POA: Diagnosis not present

## 2024-08-09 DIAGNOSIS — J90 Pleural effusion, not elsewhere classified: Secondary | ICD-10-CM | POA: Diagnosis not present

## 2024-08-09 DIAGNOSIS — I739 Peripheral vascular disease, unspecified: Secondary | ICD-10-CM | POA: Diagnosis not present

## 2024-08-09 DIAGNOSIS — E785 Hyperlipidemia, unspecified: Secondary | ICD-10-CM | POA: Diagnosis not present

## 2024-08-09 DIAGNOSIS — I952 Hypotension due to drugs: Secondary | ICD-10-CM | POA: Diagnosis not present

## 2024-08-09 DIAGNOSIS — I517 Cardiomegaly: Secondary | ICD-10-CM | POA: Diagnosis not present

## 2024-08-09 DIAGNOSIS — R5383 Other fatigue: Secondary | ICD-10-CM | POA: Diagnosis not present

## 2024-08-09 DIAGNOSIS — R52 Pain, unspecified: Secondary | ICD-10-CM | POA: Diagnosis not present

## 2024-08-09 DIAGNOSIS — I10 Essential (primary) hypertension: Secondary | ICD-10-CM | POA: Diagnosis not present

## 2024-08-09 DIAGNOSIS — K2100 Gastro-esophageal reflux disease with esophagitis, without bleeding: Secondary | ICD-10-CM | POA: Diagnosis not present

## 2024-08-09 DIAGNOSIS — K219 Gastro-esophageal reflux disease without esophagitis: Secondary | ICD-10-CM | POA: Diagnosis not present

## 2024-08-09 DIAGNOSIS — R0601 Orthopnea: Secondary | ICD-10-CM | POA: Diagnosis not present

## 2024-08-09 DIAGNOSIS — E875 Hyperkalemia: Secondary | ICD-10-CM | POA: Diagnosis not present

## 2024-08-09 DIAGNOSIS — E871 Hypo-osmolality and hyponatremia: Secondary | ICD-10-CM | POA: Diagnosis not present

## 2024-08-09 LAB — RENAL FUNCTION PANEL
Albumin: 2.4 g/dL — ABNORMAL LOW (ref 3.5–5.0)
Anion gap: 19 — ABNORMAL HIGH (ref 5–15)
BUN: 51 mg/dL — ABNORMAL HIGH (ref 6–20)
CO2: 24 mmol/L (ref 22–32)
Calcium: 7.7 mg/dL — ABNORMAL LOW (ref 8.9–10.3)
Chloride: 91 mmol/L — ABNORMAL LOW (ref 98–111)
Creatinine, Ser: 7.03 mg/dL — ABNORMAL HIGH (ref 0.61–1.24)
GFR, Estimated: 9 mL/min — ABNORMAL LOW (ref >60)
Glucose, Bld: 126 mg/dL — ABNORMAL HIGH (ref 70–99)
Phosphorus: 5.6 mg/dL — ABNORMAL HIGH (ref 2.5–4.6)
Potassium: 5 mmol/L (ref 3.5–5.1)
Sodium: 134 mmol/L — ABNORMAL LOW (ref 135–145)

## 2024-08-09 LAB — GLUCOSE, CAPILLARY
Glucose-Capillary: 122 mg/dL — ABNORMAL HIGH (ref 70–99)
Glucose-Capillary: 186 mg/dL — ABNORMAL HIGH (ref 70–99)

## 2024-08-09 MED ORDER — ACETAMINOPHEN 500 MG PO TABS: 1000.0000 mg | ORAL_TABLET | Freq: Four times a day (QID) | ORAL | Status: DC

## 2024-08-09 MED ORDER — OXYCODONE HCL 10 MG PO TABS: 10.0000 mg | ORAL_TABLET | ORAL | 0 refills | Status: DC | PRN

## 2024-08-09 MED ORDER — VANCOMYCIN IV (FOR PTA / DISCHARGE USE ONLY): 1000.0000 mg | INTRAVENOUS | Status: AC

## 2024-08-09 MED ORDER — POLYETHYLENE GLYCOL 3350 17 G PO PACK: 17.0000 g | PACK | Freq: Every day | ORAL | Status: AC

## 2024-08-09 MED ORDER — CEFTAZIDIME IV (FOR PTA / DISCHARGE USE ONLY): 2.0000 g | INTRAVENOUS | Status: AC

## 2024-08-09 MED ORDER — SODIUM ZIRCONIUM CYCLOSILICATE 10 G PO PACK: 10.0000 g | PACK | Freq: Every day | ORAL | Status: DC

## 2024-08-09 MED ORDER — SENNA 8.6 MG PO TABS: 1.0000 | ORAL_TABLET | Freq: Every day | ORAL | Status: DC

## 2024-08-09 MED ORDER — ONDANSETRON 4 MG PO TBDP
4.0000 mg | ORAL_TABLET | Freq: Three times a day (TID) | ORAL | Status: DC | PRN
  Administered 2024-08-09: 4 mg via ORAL
  Filled 2024-08-09: qty 1

## 2024-08-09 MED ORDER — INSULIN GLARGINE 100 UNIT/ML ~~LOC~~ SOLN: 22.0000 [IU] | Freq: Every day | SUBCUTANEOUS | Status: AC

## 2024-08-09 NOTE — Assessment & Plan Note (Signed)
 Stable and at patient's baseline of 140s-150s systolic BP.  - Continue Coreg  12.5mg  BID, Hydralazine  200mg  BID, Diltiazem  300mg  daily - holding Losartan  25mg  daily as above

## 2024-08-09 NOTE — Assessment & Plan Note (Signed)
 HD T/Th/S. S/p TDC exchange with IR 9/8. Potassium improved to 5.0, daily Lokelma  10g.  - Nephrology following, appreciate recommendations, continue renal diet - Holding Losartan  pending recurrent hyperkalemia - AM RFP

## 2024-08-09 NOTE — Progress Notes (Signed)
 DISCHARGE NOTE SNF [name] to be discharged Skilled nursing facility [facility] per MD order. Patient verbalized understanding.  Skin clean, dry and intact without evidence of skin break down, no evidence of skin tears noted. IV catheter discontinued intact. Site without signs and symptoms of complications. Dressing and pressure applied. Pt denies pain at the site currently. No complaints noted.  Patient free of lines, drains, and wounds.   Discharge packet assembled. An After Visit Summary (AVS) was printed and given to the EMS personnel. Patient escorted via stretcher and discharged to [facility] via ambulance. Report called to accepting facility; by floor RN all questions and concerns addressed.   [name], RN

## 2024-08-09 NOTE — Assessment & Plan Note (Signed)
 Fasting CBG improved to 126. A1C 7.3 05/2024.  - Very sensitive SSI -  Lantus  22 daily  - CBG ACHS

## 2024-08-09 NOTE — Progress Notes (Signed)
 Discharge Summary: DC order noted per MD. DC RN at bedside. Med details completed. AVS unable to print due to impaired printer function. IT called working on [employer] now. Transfer packet envelope handed to DC RN Allyson to finish up. Patient not safe to transport to DC lounge d/t impaired mobility.

## 2024-08-09 NOTE — Plan of Care (Signed)

## 2024-08-09 NOTE — Progress Notes (Signed)
 Navigator advised pt will d/c to snf today. Contacted [facility] to be advised of pt's d/c to snf today and that pt should resume care tomorrow. Clinic has both iv abx that pt will need tomorrow and renal PA to send orders to clinic. Clinic provided snf name and HD info placed on pt's AVS.   Randine Mungo Dialysis Navigator [phone]

## 2024-08-09 NOTE — TOC Transition Note (Signed)
 Transition of Care [facility]) - Discharge Note   Patient Details  Name: [name] [name] MRN: [id number] Date of Birth: [birth year]  [facility] of [facility]) CM/SW Contact:  Bridget Cordella Simmonds, LCSW Phone Number: 08/09/2024, 12:14 PM   Clinical Narrative:   Pt discharging to [facility].  RN call report to [phone].  PTAR called [phone].     Final next level of care: Skilled Nursing Facility Barriers to Discharge: Barriers Resolved   Patient Goals and CMS Choice Patient states their goals for this hospitalization and ongoing recovery are:: walk with prosthetic          Discharge Placement              Patient [facility] bed at: [facility] Patient to be transferred to facility by: ptar Name of family member notified: susan, sister Patient and family notified of of transfer: 08/09/24  Discharge Plan and Services Additional resources added to the After Visit Summary for   In-house Referral: Clinical Social Work   Post Acute Care Choice: Skilled Nursing Facility                               Social Drivers of Health (SDOH) Interventions SDOH Screenings   Food Insecurity: Food Insecurity Present (07/30/2024)  Housing: Low Risk  (07/30/2024)  Transportation Needs: No Transportation Needs (07/30/2024)  Utilities: Not At Risk (07/30/2024)  Alcohol Screen: Low Risk  (06/08/2023)  Depression (PHQ2-9): High Risk (07/21/2024)  Financial Resource Strain: High Risk (10/30/2023)  Physical Activity: Unknown (10/30/2023)  Social Connections: Patient Declined (01/23/2024)  Recent Concern: Social Connections - Socially Isolated (01/23/2024)  Stress: Stress Concern Present (07/17/2024)  Tobacco Use: High Risk (08/04/2024)  Health Literacy: Inadequate Health Literacy (06/08/2023)     Readmission Risk Interventions    01/25/2024    2:16 PM  Readmission Risk Prevention Plan  Transportation Screening Complete  PCP or Specialist Appt within 5-7 Days Complete  Home Care  Screening Complete  Medication Review (RN CM) Referral to Pharmacy

## 2024-08-09 NOTE — Assessment & Plan Note (Signed)
 GERD - continue home pantoprazole  HLD / PAD - continue home Lipitor Depression - continue home fluoxetine  HFpEF - continue home Lasix  40mg  BID

## 2024-08-09 NOTE — Progress Notes (Shared)
 Daily Progress Note Intern Pager: [phone]  Patient name: [name] [name] Medical record number: [id number] Date of birth: [birth year] Age: 53 y.o. Gender: male  Primary Care Provider: Adele Song, MD Consultants: Ortho, nephrology Code Status: Full  Pt Overview and Major Events to Date:  9/6 - Admitted 9/8 - TDC exchange by IR 9/12 - excision of encapsulated hematoma abscess popliteal fossa R knee by Dr. Harden 9/16 - Pt admitted to SNF  Medical Decision Making:  [name] [name] is a 53 y.o. 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 CT knee 9/6 with soft tissue thickening suspicious for subQ abscess. S/p knee aspiration performed by EDP and I&D performed by ortho. Receiving broad IV abx with no growth on aspiration culture.  Assessment & Plan Abscess of right lower extremity POD2 excision of encapsulated hematoma abscess popliteal fossa R knee by Dr. Harden. Pt reporting moderate improvement of pain with change in medication. - Continue Vancomycin  T/Th/S with HD (9/9-) - Continue Ceftriaxone , transition to Ceftazidine at discharge (9/6 - 9/19) - Pain regimen: Tylenol  1000mg  q6h scheduled, increased Oxycodone  5-7.5mg  q4h PRN > Oxycodone  7.5mg -10mg  q4h PRN for moderate-severe pain. - NWB on R stump - no prostheses for two weeks - SNF placement confirmed.  - Fall precautions ESRD on hemodialysis (HCC) Mechanical complication of dialysis catheter (HCC) HD T/Th/S. S/p TDC exchange with IR 9/8. Potassium improved to 5.0, daily Lokelma  10g.  - Nephrology following, appreciate recommendations, continue renal diet - Holding Losartan  pending recurrent hyperkalemia - AM RFP Uncontrolled type 2 diabetes mellitus with hyperglycemia, with long-term current use of insulin  (HCC) Fasting CBG improved to 126. A1C 7.3 05/2024.  - Very sensitive SSI -  Lantus  22 daily  - CBG ACHS Severe  hypertension Stable and at patient's baseline of 140s-150s systolic BP.  - Continue Coreg  12.5mg  BID, Hydralazine  200mg  BID, Diltiazem  300mg  daily - holding Losartan  25mg  daily as above Anemia of chronic disease HGB stable at 7.3 this AM.  No sign of active bleeding.  - AM CBC - Transfusion threshold <7 unless symptomatic  Chronic health problem GERD - continue home pantoprazole  HLD / PAD - continue home Lipitor Depression - continue home fluoxetine  HFpEF - continue home Lasix  40mg  BID    FEN/GI: carb modified PPx: heparin  Dispo:Home pending clinical improvement .   Subjective:  Pt states he is doing alright. Reports having a bowel movement this morning. He states that it took 3 people to get to and from the toilet. His RLE is doing ok. Better pain control than before.   Objective: Temp:  [97.5 F (36.4 C)-99.4 F (37.4 C)] 98.3 F (36.8 C) (09/17 0340) Pulse Rate:  [60-92] 84 (09/17 0340) Resp:  [8-23] 17 (09/17 0340) BP: (129-202)/(52-83) 153/80 (09/17 0340) SpO2:  [96 %-99 %] 99 % (09/17 0340) Weight:  [113.9 kg] 113.9 kg (09/16 1744) Physical Exam: General: NAD, nontoxic appearing Cardiovascular: regular rate and rhythm Respiratory: no wheezes or crackles. Good air movement throughout Abdomen: soft, nontender, nondistended Extremities: RLE bandaging clean and dry. Moving limbs normally. No edema noted  Laboratory: Most recent CBC Lab Results  Component Value Date   WBC 7.5 08/08/2024   HGB 7.3 (L) 08/08/2024   HCT 25.4 (L) 08/08/2024   MCV 74.9 (L) 08/08/2024   PLT 162 08/08/2024   Most recent BMP    Latest Ref Rng & Units 08/09/2024  4:01 AM  BMP  Glucose 70 - 99 mg/dL 873   BUN 6 - 20 mg/dL 51   Creatinine 9.38 - 1.24 mg/dL 2.96   Sodium 864 - 854 mmol/L 134   Potassium 3.5 - 5.1 mmol/L 5.0   Chloride 98 - 111 mmol/L 91   CO2 22 - 32 mmol/L 24   Calcium  8.9 - 10.3 mg/dL 7.7       Imaging/Diagnostic Tests: No new imaging    Benjamine [name], Medical Student 08/09/2024, 7:55 AM  MS4 AI, [facility] FPTS Intern pager: [phone], text pages welcome Secure chat group [facility] Teaching Service

## 2024-08-09 NOTE — Assessment & Plan Note (Signed)
 HGB stable at 7.3 this AM.  No sign of active bleeding.  - AM CBC - Transfusion threshold <7 unless symptomatic

## 2024-08-09 NOTE — Discharge Planning (Signed)
 [facility] Patient Discharge Orders - [facility] CLINIC: [facility]  Patient's name: [name] [name] Admit/DC Dates: 07/29/2024 - 08/09/24  DISCHARGE DIAGNOSES: R knee abscess -> s/p R knee debridement 08/04/24 by Dr. Harden -> on IV abx Broken TDC -> s/p exchange 9/8 Hyperkalemia  HD ORDER CHANGES: Heparin  change: no EDW Change: no *wide variation in weight here - hard to tell* Bath Change: no  ANEMIA MANAGEMENT: Aranesp : Given: no   ESA dose for discharge: Same as prior - Mircera 225mcg q 2 weeks (due 9/20) IV Iron  dose at discharge: Per protocol  Transfusion: Given: no  BONE/MINERAL MEDICATIONS: Hectorol/Calcitriol  change: no Sensipar/Parsabiv change: no  ACCESS INTERVENTION/CHANGE: YES - s/p [facility] exchange 9/8 for cracked TDC limb Details:  RECENT LABS: Recent Labs  Lab 08/08/24 0633 08/09/24 0401  HGB 7.3*  --   NA 129* 134*  K 5.8* 5.0  CALCIUM  7.7* 7.7*  PHOS 7.3* 5.6*  ALBUMIN  2.6* 2.4*   IV ANTIBIOTICS: YES - needs ONE dose Vancomycin  1g and Ceftazidime  2g on 9/18 to complete abx course Details:  OTHER ANTICOAGULATION: no Details:  OTHER/APPTS/LAB ORDERS: - Continue to encourage low K diet and Lokelma  adherence  D/C Meds to be reconciled by nurse after every discharge.  Completed By: Izetta Boehringer, PA-C [facility] Pager [phone]   Reviewed by: MD:______ RN_______

## 2024-08-09 NOTE — Assessment & Plan Note (Deleted)
 POD2 excision of encapsulated hematoma abscess popliteal fossa R knee by Dr. Harden. Pt reporting moderate improvement of pain with change in medication. - Continue Vancomycin  T/Th/S with HD (9/9-) - Continue Ceftriaxone , transition to Ceftazidine at discharge (9/6 - 9/19) - Pain regimen: Tylenol  1000mg  q6h scheduled, increased Oxycodone  5-7.5mg  q4h PRN > Oxycodone  7.5mg -10mg  q4h PRN for moderate-severe pain. - NWB on R stump - no prostheses for two weeks - SNF placement confirmed.  - Fall precautions

## 2024-08-09 NOTE — TOC Progression Note (Signed)
 Transition of Care [facility]) - Progression Note    Patient Details  Name: [name] [name] MRN: [id number] Date of Birth: November 10, 1971  Transition of Care [facility]) CM/SW Contact  Bridget Cordella Simmonds, LCSW Phone Number: 08/09/2024, 11:05 AM  Clinical Narrative:   CSW confirmed with [facility] that they can receive pt today.  1100: CSW spoke with pt regarding transportation.  He does want ambulance transport, aware that he could receive bill for this.     Expected Discharge Plan: Skilled Nursing Facility Barriers to Discharge: Continued Medical Work up, SNF Pending bed offer               Expected Discharge Plan and Services In-house Referral: Clinical Social Work   Post Acute Care Choice: Skilled Nursing Facility Living arrangements for the past 2 months: Single Family Home Expected Discharge Date: 08/09/24                                     Social Drivers of Health (SDOH) Interventions SDOH Screenings   Food Insecurity: Food Insecurity Present (07/30/2024)  Housing: Low Risk  (07/30/2024)  Transportation Needs: No Transportation Needs (07/30/2024)  Utilities: Not At Risk (07/30/2024)  Alcohol Screen: Low Risk  (06/08/2023)  Depression (PHQ2-9): High Risk (07/21/2024)  Financial Resource Strain: High Risk (10/30/2023)  Physical Activity: Unknown (10/30/2023)  Social Connections: Patient Declined (01/23/2024)  Recent Concern: Social Connections - Socially Isolated (01/23/2024)  Stress: Stress Concern Present (07/17/2024)  Tobacco Use: High Risk (08/04/2024)  Health Literacy: Inadequate Health Literacy (06/08/2023)    Readmission Risk Interventions    01/25/2024    2:16 PM  Readmission Risk Prevention Plan  Transportation Screening Complete  PCP or Specialist Appt within 5-7 Days Complete  Home Care Screening Complete  Medication Review (RN CM) Referral to Pharmacy

## 2024-08-10 ENCOUNTER — Telehealth (HOSPITAL_COMMUNITY): Payer: Self-pay | Admitting: Nephrology

## 2024-08-10 ENCOUNTER — Other Ambulatory Visit: Payer: Self-pay

## 2024-08-10 DIAGNOSIS — D689 Coagulation defect, unspecified: Secondary | ICD-10-CM | POA: Diagnosis not present

## 2024-08-10 DIAGNOSIS — Z992 Dependence on renal dialysis: Secondary | ICD-10-CM | POA: Diagnosis not present

## 2024-08-10 DIAGNOSIS — J90 Pleural effusion, not elsewhere classified: Secondary | ICD-10-CM | POA: Diagnosis not present

## 2024-08-10 DIAGNOSIS — N2581 Secondary hyperparathyroidism of renal origin: Secondary | ICD-10-CM | POA: Diagnosis not present

## 2024-08-10 DIAGNOSIS — R112 Nausea with vomiting, unspecified: Secondary | ICD-10-CM

## 2024-08-10 DIAGNOSIS — L02415 Cutaneous abscess of right lower limb: Secondary | ICD-10-CM | POA: Diagnosis not present

## 2024-08-10 DIAGNOSIS — E1165 Type 2 diabetes mellitus with hyperglycemia: Secondary | ICD-10-CM | POA: Diagnosis not present

## 2024-08-10 DIAGNOSIS — D509 Iron deficiency anemia, unspecified: Secondary | ICD-10-CM | POA: Diagnosis not present

## 2024-08-10 DIAGNOSIS — N186 End stage renal disease: Secondary | ICD-10-CM | POA: Diagnosis not present

## 2024-08-10 DIAGNOSIS — K219 Gastro-esophageal reflux disease without esophagitis: Secondary | ICD-10-CM | POA: Diagnosis not present

## 2024-08-10 DIAGNOSIS — E1159 Type 2 diabetes mellitus with other circulatory complications: Secondary | ICD-10-CM | POA: Diagnosis not present

## 2024-08-10 DIAGNOSIS — T8249XA Other complication of vascular dialysis catheter, initial encounter: Secondary | ICD-10-CM | POA: Diagnosis not present

## 2024-08-10 DIAGNOSIS — R531 Weakness: Secondary | ICD-10-CM | POA: Diagnosis not present

## 2024-08-10 DIAGNOSIS — I152 Hypertension secondary to endocrine disorders: Secondary | ICD-10-CM | POA: Diagnosis not present

## 2024-08-10 DIAGNOSIS — Z7409 Other reduced mobility: Secondary | ICD-10-CM | POA: Diagnosis not present

## 2024-08-10 DIAGNOSIS — D631 Anemia in chronic kidney disease: Secondary | ICD-10-CM | POA: Diagnosis not present

## 2024-08-10 DIAGNOSIS — R051 Acute cough: Secondary | ICD-10-CM | POA: Diagnosis not present

## 2024-08-10 DIAGNOSIS — M71061 Abscess of bursa, right knee: Secondary | ICD-10-CM | POA: Diagnosis not present

## 2024-08-10 MED ORDER — PANTOPRAZOLE SODIUM 40 MG PO TBEC: 40.0000 mg | DELAYED_RELEASE_TABLET | Freq: Every day | ORAL | 3 refills | Status: AC

## 2024-08-10 NOTE — Telephone Encounter (Signed)
 Transition of care contact from inpatient facility  Date of Discharge: 08/09/2024 Date of Contact: 08/10/2024 - attempt #1 Method of contact: Phone  Attempted to contact patient to discuss transition of care from inpatient admission. Patient did not answer the phone. Message was left on the patient's voicemail with call back number [phone].  Izetta Boehringer, PA-C [employer] Pager [phone]

## 2024-08-10 NOTE — Telephone Encounter (Signed)
 Transition of Care - Initial Contact after Hospitalization  Date of discharge: 08/09/2024 Date of contact: 08/10/24  Method: Phone Spoke to: Patient  Patient called me back to discuss transition of care from recent inpatient hospitalization. Patient was admitted to [facility] from 9/6-9/17/2025 with R knee abscess. He did attend HD today and get last dose of IV abx today.  The discharge medication list was reviewed. He's had a lot of nausea, didn't eat much today and wondering what he can do. Zofran  on discharge med list - try that for now and see if will help - he understands.  Suspect will take a while to feel 100%.  Izetta Boehringer, PA-C [employer] Pager [phone]

## 2024-08-11 DIAGNOSIS — Z7409 Other reduced mobility: Secondary | ICD-10-CM | POA: Diagnosis not present

## 2024-08-11 DIAGNOSIS — R531 Weakness: Secondary | ICD-10-CM | POA: Diagnosis not present

## 2024-08-11 DIAGNOSIS — Z89511 Acquired absence of right leg below knee: Secondary | ICD-10-CM | POA: Diagnosis not present

## 2024-08-11 DIAGNOSIS — N186 End stage renal disease: Secondary | ICD-10-CM | POA: Diagnosis not present

## 2024-08-11 DIAGNOSIS — L02415 Cutaneous abscess of right lower limb: Secondary | ICD-10-CM | POA: Diagnosis not present

## 2024-08-11 DIAGNOSIS — Z992 Dependence on renal dialysis: Secondary | ICD-10-CM | POA: Diagnosis not present

## 2024-08-11 DIAGNOSIS — E1165 Type 2 diabetes mellitus with hyperglycemia: Secondary | ICD-10-CM | POA: Diagnosis not present

## 2024-08-12 DIAGNOSIS — D631 Anemia in chronic kidney disease: Secondary | ICD-10-CM | POA: Diagnosis not present

## 2024-08-12 DIAGNOSIS — D689 Coagulation defect, unspecified: Secondary | ICD-10-CM | POA: Diagnosis not present

## 2024-08-12 DIAGNOSIS — R531 Weakness: Secondary | ICD-10-CM | POA: Diagnosis not present

## 2024-08-12 DIAGNOSIS — T8249XA Other complication of vascular dialysis catheter, initial encounter: Secondary | ICD-10-CM | POA: Diagnosis not present

## 2024-08-12 DIAGNOSIS — Z7409 Other reduced mobility: Secondary | ICD-10-CM | POA: Diagnosis not present

## 2024-08-12 DIAGNOSIS — Z992 Dependence on renal dialysis: Secondary | ICD-10-CM | POA: Diagnosis not present

## 2024-08-12 DIAGNOSIS — D509 Iron deficiency anemia, unspecified: Secondary | ICD-10-CM | POA: Diagnosis not present

## 2024-08-12 DIAGNOSIS — Z89511 Acquired absence of right leg below knee: Secondary | ICD-10-CM | POA: Diagnosis not present

## 2024-08-12 DIAGNOSIS — M71061 Abscess of bursa, right knee: Secondary | ICD-10-CM | POA: Diagnosis not present

## 2024-08-12 DIAGNOSIS — N2581 Secondary hyperparathyroidism of renal origin: Secondary | ICD-10-CM | POA: Diagnosis not present

## 2024-08-12 DIAGNOSIS — N186 End stage renal disease: Secondary | ICD-10-CM | POA: Diagnosis not present

## 2024-08-12 DIAGNOSIS — L02415 Cutaneous abscess of right lower limb: Secondary | ICD-10-CM | POA: Diagnosis not present

## 2024-08-14 ENCOUNTER — Encounter: Payer: Self-pay | Admitting: Orthopedic Surgery

## 2024-08-14 ENCOUNTER — Ambulatory Visit (INDEPENDENT_AMBULATORY_CARE_PROVIDER_SITE_OTHER): Admitting: Orthopedic Surgery

## 2024-08-14 DIAGNOSIS — Z89511 Acquired absence of right leg below knee: Secondary | ICD-10-CM

## 2024-08-14 DIAGNOSIS — S88111S Complete traumatic amputation at level between knee and ankle, right lower leg, sequela: Secondary | ICD-10-CM

## 2024-08-14 DIAGNOSIS — L97811 Non-pressure chronic ulcer of other part of right lower leg limited to breakdown of skin: Secondary | ICD-10-CM

## 2024-08-14 NOTE — Progress Notes (Signed)
 Office Visit Note   Patient: [name] [name]           Date of Birth: 1971/02/05           MRN: [id number] Visit Date: 08/14/2024              Requested by: Adele Song, MD [address] [place],  [place] [address] PCP: Adele Song, MD  Chief Complaint  Patient presents with   Right Knee - Routine Post Op    08/04/2024 I&D righ tknee       HPI: Discussed the use of AI scribe software for clinical note transcription with the patient, who gave verbal consent to proceed.  History of Present Illness [name] [name] is a 53 year old male who presents with postoperative wound care concerns following a procedure involving the popliteal fossa.  Patient is in skilled nursing for rehab.  He experiences significant pain at the incision site, describing it as 'hurts bad'. The back of his leg is purple, and he has stiffness in the leg.  The dressing on the wound was last changed on Saturday, and it was still bloody at that time. He describes the drainage as 'that Kool Aid fluid that's leaking out of there'. He recalls using a type of dressing at the hospital that stayed on for about a week, but he is now using a dressing that can be changed daily.     Assessment & Plan: Visit Diagnoses:  1. Below-knee amputation of right lower extremity, sequela (HCC)   2. Skin ulcer of right knee, limited to breakdown of skin (HCC)     Plan: Assessment and Plan Assessment & Plan Postoperative wound of popliteal fossa with serosanguinous drainage Wound healing with clear serosanguinous drainage. No infection or cellulitis. - Order daily wound cleansing with soap and water. - Change dressing daily with 4x4 gauze and ACE wrap. - Schedule follow-up in one week for suture removal.      Follow-Up Instructions: Return in about 2 weeks (around 08/28/2024).   Ortho Exam  Patient is alert, oriented, no adenopathy, well-dressed, normal affect, normal respiratory effort. Physical  Exam EXTREMITIES: Clear serosanguinous drainage from incision in popliteal fossa. No cellulitis, no purulent drainage, no signs of infection.      Imaging: No results found. No images are attached to the encounter.  Labs: Lab Results  Component Value Date   HGBA1C 7.3 (H) 06/09/2024   HGBA1C 6.1 (H) 10/13/2023   HGBA1C 6.6 05/18/2023   ESRSEDRATE 68 (H) 04/10/2021   ESRSEDRATE 75 (H) 10/20/2017   CRP 1.9 (H) 10/20/2017   REPTSTATUS 08/01/2024 FINAL 07/29/2024   GRAMSTAIN  07/29/2024    ABUNDANT WBC PRESENT, PREDOMINANTLY PMN ABUNDANT GRAM NEGATIVE RODS ABUNDANT GRAM POSITIVE COCCI    CULT  07/29/2024    NO GROWTH 3 DAYS Performed at [facility], [address] [place], [place] [address]    [facility] ENTEROCOCCUS MARIJO 05/17/2024     Lab Results  Component Value Date   ALBUMIN  2.4 (L) 08/09/2024   ALBUMIN  2.6 (L) 08/08/2024   ALBUMIN  2.5 (L) 08/07/2024    Lab Results  Component Value Date   MG 1.8 02/12/2023   MG 1.7 09/02/2021   MG 2.0 10/13/2017   Lab Results  Component Value Date   VD25OH 10.22 (L) 10/14/2023    No results found for: PREALBUMIN    Latest Ref Rng & Units 08/08/2024    6:33 AM 08/07/2024    5:47 AM 08/06/2024  7:14 AM  CBC EXTENDED  WBC 4.0 - 10.5 K/uL 7.5  8.0  8.8   RBC 4.22 - 5.81 MIL/uL 3.39  3.40  3.66   Hemoglobin 13.0 - 17.0 g/dL 7.3  7.3  7.8   HCT 60.9 - 52.0 % 25.4  25.5  27.2   Platelets 150 - 400 K/uL 162  162  184      There is no height or weight on file to calculate BMI.  Orders:  No orders of the defined types were placed in this encounter.  No orders of the defined types were placed in this encounter.    Procedures: No procedures performed  Clinical Data: No additional findings.  ROS:  All other systems negative, except as noted in the HPI. Review of Systems  Objective: Vital Signs: There were no vitals taken for this visit.  Specialty Comments:  No specialty comments  available.  PMFS History: Patient Active Problem List   Diagnosis Date Noted   Mechanical complication of dialysis catheter 07/29/2024   Abscess of right lower extremity 07/29/2024   Chronic health problem 07/29/2024   ESRD on hemodialysis (HCC) 07/29/2024   Anemia of chronic disease 07/29/2024   Nausea and vomiting 01/26/2024   A-V fistula 01/25/2024   Depression 01/24/2024   Nonimmune to hepatitis B virus 10/22/2023   Urinary retention 10/14/2023   Closed intertrochanteric fracture of right hip (HCC) 10/13/2023   Contracture, 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   Pleural effusion 02/16/2023   Allergic rhinitis 11/07/2022   Tobacco dependence 09/11/2022   ESRD (end stage renal disease) on dialysis (HCC) 09/11/2022   GERD with esophagitis 09/02/2021   Acute kidney injury superimposed on CKD (HCC) 04/10/2021   Achilles tendon contracture, left 03/03/2018   Non-pressure chronic ulcer of other part of left foot limited to breakdown of skin (HCC) 02/02/2018   History of right below knee amputation (HCC) 12/22/2017   Acute posthemorrhagic anemia    Falls 11/24/2017   Severe hypertension 11/24/2017   Anemia 11/24/2017   Uncontrolled type 2 diabetes mellitus with hyperglycemia, with long-term current use of insulin  (HCC) 10/11/2017   ARF (acute renal failure) 10/11/2017   Past Medical History:  Diagnosis Date   Abdominal pain 11/30/2023   Abdominal tenderness 10/14/2023   Allergy    seasonal and environmental   Anemia    hx blood transfusion 04/27/24   Chest pain 12/01/2023   Chronic constipation 05/13/2020   Depression    Diabetes mellitus without complication (HCC) 2019   Dyspnea    hx - SOB with exertion   ESRD on hemodialysis (HCC)    Tues, Thurs, Sat   GERD (gastroesophageal reflux disease)    Hyperlipidemia    Hypertension    Hypertensive urgency 01/24/2024   Hypotension 12/01/2023   Necrotizing fasciitis (HCC) 10/13/2017   Neuromuscular disorder (HCC)    neuropathy   PAD  (peripheral artery disease)    Patellar tendinitis 10/12/2023   Secondary hyperparathyroidism    SVT (supraventricular tachycardia)    hx   Wound dehiscence 11/24/2017    Family History  Problem Relation Age of Onset   Diabetes Mellitus II Mother    Depression Mother    Colon polyps Mother    Hypertension Mother    High Cholesterol Mother    Heart disease Father        CABG x 4.  04/2017   Diabetes Father    High Cholesterol Father    Hypertension Father    Heart disease Maternal Grandmother  Heart disease Maternal Grandfather    Colon cancer Neg Hx    Esophageal cancer Neg Hx    Liver cancer Neg Hx    Pancreatic cancer Neg Hx    Rectal cancer Neg Hx    Stomach cancer Neg Hx     Past Surgical History:  Procedure Laterality Date   A/V FISTULAGRAM N/A 03/17/2024   Procedure: A/V Fistulagram;  Surgeon: Magda Debby SAILOR, MD;  [facility] INV[facility] LAB;  Service: Cardiovascular;  Laterality: N/A;   AMPUTATION Right 10/13/2017   Procedure: RIGHT BELOW KNEE AMPUTATION;  Surgeon: Harden Jerona GAILS, MD;  Location: [facility] OR;  Service: Orthopedics;  Laterality: Right;   AMPUTATION Right 11/24/2017   Procedure: AMPUTATION BELOW KNEE REVISION;  Surgeon: Harden Jerona GAILS, MD;  Location: [facility] OR;  Service: Orthopedics;  Laterality: Right;   AMPUTATION TOE Left    AV FISTULA PLACEMENT Left 10/19/2023   Procedure: LEFT BRACHIOCEPHALIC ARTERIOVENOUS (AV) FISTULA CREATION;  Surgeon: Pearline Norman RAMAN, MD;  Location: [facility] OR;  Service: Vascular;  Laterality: Left;   AV FISTULA PLACEMENT Left 01/26/2024   Procedure: LEFT ARTERIOVENOUS (AV) FISTULA CREATION;  Surgeon: Pearline Norman RAMAN, MD;  Location: [facility] OR;  Service: Vascular;  Laterality: Left;   BASCILIC VEIN TRANSPOSITION Left 04/26/2024   Procedure: LEFT SECOND STAGE TRANSPOSITION, VEIN, BASILIC;  Surgeon: Magda Debby SAILOR, MD;  Location: [facility] OR;  Service: Vascular;  Laterality: Left;   BIOPSY  09/02/2021   Procedure: BIOPSY;  Surgeon: Federico Rosario BROCKS, MD;   Location: [facility] ENDOSCOPY;  Service: Gastroenterology;;   BIOPSY  02/18/2023   Procedure: BIOPSY;  Surgeon: Leigh Elspeth SQUIBB, MD;  Location: [facility] ENDOSCOPY;  Service: Gastroenterology;;   COLONOSCOPY  05/11/2019   COLONOSCOPY  2021   3 polyps - tubular adenomas   ESOPHAGOGASTRODUODENOSCOPY (EGD) WITH PROPOFOL  N/A 09/02/2021   Procedure: ESOPHAGOGASTRODUODENOSCOPY (EGD) WITH PROPOFOL ;  Surgeon: Federico Rosario BROCKS, MD;  Location: [facility] ENDOSCOPY;  Service: Gastroenterology;  Laterality: N/A;   ESOPHAGOGASTRODUODENOSCOPY (EGD) WITH PROPOFOL  N/A 02/18/2023   Procedure: ESOPHAGOGASTRODUODENOSCOPY (EGD) WITH PROPOFOL ;  Surgeon: Leigh Elspeth SQUIBB, MD;  Location: [facility] ENDOSCOPY;  Service: Gastroenterology;  Laterality: N/A;   INCISION AND DRAINAGE OF WOUND Left 05/17/2024   Procedure: IRRIGATION AND DEBRIDEMENT WOUND;  Surgeon: Serene Gaile ORN, MD;  Location: [facility] OR;  Service: Vascular;  Laterality: Left;  Arm   INTRAMEDULLARY (IM) NAIL INTERTROCHANTERIC Right 10/15/2023   Procedure: INTRAMEDULLARY (IM) NAIL INTERTROCHANTERIC;  Surgeon: Kendal Franky SQUIBB, MD;  Location: [facility] OR;  Service: Orthopedics;  Laterality: Right;   IR FLUORO GUIDE CV LINE RIGHT  10/15/2023   IR TUNNELED CENTRAL VENOUS CATH PLC W IMG  07/31/2024   IR US  GUIDE VASC ACCESS RIGHT  10/15/2023   IRRIGATION AND DEBRIDEMENT KNEE Right 08/04/2024   Procedure: IRRIGATION AND DEBRIDEMENT KNEE, RIGHT;  Surgeon: Harden Jerona GAILS, MD;  Location: [facility] OR;  Service: Orthopedics;  Laterality: Right;  Debridement Right Popliteal Fossa Abscess   POLYPECTOMY     WISDOM TOOTH EXTRACTION     Social History   Occupational History   Occupation: unemployed  Tobacco Use   Smoking status: Every Day    Current packs/day: 0.50    Average packs/day: 0.5 packs/day for 36.2 years (18.0 ttl pk-yrs)    Types: Cigarettes    Start date: 11/23/1988   Smokeless tobacco: Never   Tobacco comments:    6 cigarettes daily- khj 05/24/2024        Started smoking at 52 years old     Smoked  1PPD at his heaviest  Vaping Use   Vaping status: Never Used  Substance and Sexual Activity   Alcohol use: No   Drug use: No   Sexual activity: Not Currently

## 2024-08-15 DIAGNOSIS — D631 Anemia in chronic kidney disease: Secondary | ICD-10-CM | POA: Diagnosis not present

## 2024-08-15 DIAGNOSIS — Z992 Dependence on renal dialysis: Secondary | ICD-10-CM | POA: Diagnosis not present

## 2024-08-15 DIAGNOSIS — R52 Pain, unspecified: Secondary | ICD-10-CM | POA: Diagnosis not present

## 2024-08-15 DIAGNOSIS — L02415 Cutaneous abscess of right lower limb: Secondary | ICD-10-CM | POA: Diagnosis not present

## 2024-08-15 DIAGNOSIS — R531 Weakness: Secondary | ICD-10-CM | POA: Diagnosis not present

## 2024-08-15 DIAGNOSIS — D689 Coagulation defect, unspecified: Secondary | ICD-10-CM | POA: Diagnosis not present

## 2024-08-15 DIAGNOSIS — Z89511 Acquired absence of right leg below knee: Secondary | ICD-10-CM | POA: Diagnosis not present

## 2024-08-15 DIAGNOSIS — M71061 Abscess of bursa, right knee: Secondary | ICD-10-CM | POA: Diagnosis not present

## 2024-08-15 DIAGNOSIS — E875 Hyperkalemia: Secondary | ICD-10-CM | POA: Diagnosis not present

## 2024-08-15 DIAGNOSIS — N2581 Secondary hyperparathyroidism of renal origin: Secondary | ICD-10-CM | POA: Diagnosis not present

## 2024-08-15 DIAGNOSIS — D509 Iron deficiency anemia, unspecified: Secondary | ICD-10-CM | POA: Diagnosis not present

## 2024-08-15 DIAGNOSIS — N186 End stage renal disease: Secondary | ICD-10-CM | POA: Diagnosis not present

## 2024-08-15 DIAGNOSIS — Z7409 Other reduced mobility: Secondary | ICD-10-CM | POA: Diagnosis not present

## 2024-08-15 DIAGNOSIS — T8249XA Other complication of vascular dialysis catheter, initial encounter: Secondary | ICD-10-CM | POA: Diagnosis not present

## 2024-08-16 DIAGNOSIS — N186 End stage renal disease: Secondary | ICD-10-CM | POA: Diagnosis not present

## 2024-08-16 DIAGNOSIS — E1165 Type 2 diabetes mellitus with hyperglycemia: Secondary | ICD-10-CM | POA: Diagnosis not present

## 2024-08-16 DIAGNOSIS — R531 Weakness: Secondary | ICD-10-CM | POA: Diagnosis not present

## 2024-08-16 DIAGNOSIS — Z992 Dependence on renal dialysis: Secondary | ICD-10-CM | POA: Diagnosis not present

## 2024-08-16 DIAGNOSIS — L02415 Cutaneous abscess of right lower limb: Secondary | ICD-10-CM | POA: Diagnosis not present

## 2024-08-16 DIAGNOSIS — Z89511 Acquired absence of right leg below knee: Secondary | ICD-10-CM | POA: Diagnosis not present

## 2024-08-16 DIAGNOSIS — J90 Pleural effusion, not elsewhere classified: Secondary | ICD-10-CM | POA: Diagnosis not present

## 2024-08-16 DIAGNOSIS — J810 Acute pulmonary edema: Secondary | ICD-10-CM | POA: Diagnosis not present

## 2024-08-17 DIAGNOSIS — D689 Coagulation defect, unspecified: Secondary | ICD-10-CM | POA: Diagnosis not present

## 2024-08-17 DIAGNOSIS — N186 End stage renal disease: Secondary | ICD-10-CM | POA: Diagnosis not present

## 2024-08-17 DIAGNOSIS — D509 Iron deficiency anemia, unspecified: Secondary | ICD-10-CM | POA: Diagnosis not present

## 2024-08-17 DIAGNOSIS — I152 Hypertension secondary to endocrine disorders: Secondary | ICD-10-CM | POA: Diagnosis not present

## 2024-08-17 DIAGNOSIS — E1165 Type 2 diabetes mellitus with hyperglycemia: Secondary | ICD-10-CM | POA: Diagnosis not present

## 2024-08-17 DIAGNOSIS — N2581 Secondary hyperparathyroidism of renal origin: Secondary | ICD-10-CM | POA: Diagnosis not present

## 2024-08-17 DIAGNOSIS — R531 Weakness: Secondary | ICD-10-CM | POA: Diagnosis not present

## 2024-08-17 DIAGNOSIS — Z992 Dependence on renal dialysis: Secondary | ICD-10-CM | POA: Diagnosis not present

## 2024-08-17 DIAGNOSIS — D631 Anemia in chronic kidney disease: Secondary | ICD-10-CM | POA: Diagnosis not present

## 2024-08-17 DIAGNOSIS — T8249XA Other complication of vascular dialysis catheter, initial encounter: Secondary | ICD-10-CM | POA: Diagnosis not present

## 2024-08-17 DIAGNOSIS — M71061 Abscess of bursa, right knee: Secondary | ICD-10-CM | POA: Diagnosis not present

## 2024-08-19 DIAGNOSIS — N2581 Secondary hyperparathyroidism of renal origin: Secondary | ICD-10-CM | POA: Diagnosis not present

## 2024-08-19 DIAGNOSIS — M71061 Abscess of bursa, right knee: Secondary | ICD-10-CM | POA: Diagnosis not present

## 2024-08-19 DIAGNOSIS — D689 Coagulation defect, unspecified: Secondary | ICD-10-CM | POA: Diagnosis not present

## 2024-08-19 DIAGNOSIS — Z7409 Other reduced mobility: Secondary | ICD-10-CM | POA: Diagnosis not present

## 2024-08-19 DIAGNOSIS — N186 End stage renal disease: Secondary | ICD-10-CM | POA: Diagnosis not present

## 2024-08-19 DIAGNOSIS — R531 Weakness: Secondary | ICD-10-CM | POA: Diagnosis not present

## 2024-08-19 DIAGNOSIS — T8249XA Other complication of vascular dialysis catheter, initial encounter: Secondary | ICD-10-CM | POA: Diagnosis not present

## 2024-08-19 DIAGNOSIS — Z992 Dependence on renal dialysis: Secondary | ICD-10-CM | POA: Diagnosis not present

## 2024-08-19 DIAGNOSIS — D631 Anemia in chronic kidney disease: Secondary | ICD-10-CM | POA: Diagnosis not present

## 2024-08-19 DIAGNOSIS — I1311 Hypertensive heart and chronic kidney disease without heart failure, with stage 5 chronic kidney disease, or end stage renal disease: Secondary | ICD-10-CM | POA: Diagnosis not present

## 2024-08-19 DIAGNOSIS — D509 Iron deficiency anemia, unspecified: Secondary | ICD-10-CM | POA: Diagnosis not present

## 2024-08-19 DIAGNOSIS — I952 Hypotension due to drugs: Secondary | ICD-10-CM | POA: Diagnosis not present

## 2024-08-20 DIAGNOSIS — Z992 Dependence on renal dialysis: Secondary | ICD-10-CM | POA: Diagnosis not present

## 2024-08-20 DIAGNOSIS — Z7409 Other reduced mobility: Secondary | ICD-10-CM | POA: Diagnosis not present

## 2024-08-20 DIAGNOSIS — R001 Bradycardia, unspecified: Secondary | ICD-10-CM | POA: Diagnosis not present

## 2024-08-20 DIAGNOSIS — R531 Weakness: Secondary | ICD-10-CM | POA: Diagnosis not present

## 2024-08-20 DIAGNOSIS — N186 End stage renal disease: Secondary | ICD-10-CM | POA: Diagnosis not present

## 2024-08-20 DIAGNOSIS — D638 Anemia in other chronic diseases classified elsewhere: Secondary | ICD-10-CM | POA: Diagnosis not present

## 2024-08-21 ENCOUNTER — Ambulatory Visit (INDEPENDENT_AMBULATORY_CARE_PROVIDER_SITE_OTHER): Admitting: Physician Assistant

## 2024-08-21 DIAGNOSIS — R531 Weakness: Secondary | ICD-10-CM | POA: Diagnosis not present

## 2024-08-21 DIAGNOSIS — L97811 Non-pressure chronic ulcer of other part of right lower leg limited to breakdown of skin: Secondary | ICD-10-CM

## 2024-08-21 DIAGNOSIS — Z992 Dependence on renal dialysis: Secondary | ICD-10-CM | POA: Diagnosis not present

## 2024-08-21 DIAGNOSIS — N186 End stage renal disease: Secondary | ICD-10-CM | POA: Diagnosis not present

## 2024-08-21 DIAGNOSIS — M25561 Pain in right knee: Secondary | ICD-10-CM

## 2024-08-21 DIAGNOSIS — G8929 Other chronic pain: Secondary | ICD-10-CM

## 2024-08-21 DIAGNOSIS — I152 Hypertension secondary to endocrine disorders: Secondary | ICD-10-CM | POA: Diagnosis not present

## 2024-08-21 NOTE — Progress Notes (Unsigned)
 Office Visit Note   Patient: [name] [name]           Date of Birth: 12/24/1970           MRN: [id number] Visit Date: 08/21/2024              Requested by: Adele Song, MD [address] [place],  [place] [address] PCP: Adele Song, MD  Chief Complaint  Patient presents with   Right Knee - Routine Post Op    08/04/2024 I&D right knee       HPI: 53 y/o male with history of right BKA underwent Excisional debridement abscess popliteal fossa right knee with excision skin soft tissue muscle fascia and pseudocyst. He is currently in a skilled facility.  He has swelling and he states he hit the posterior knee on something in the bathroom that caused it to bleed.    Assessment & Plan: Visit Diagnoses: No diagnosis found.  Plan: Sutures removed, dry dressing and ace for compression.  He will try to elevate his right BKA above his heart multiple times a day to decrease the swelling.  Work on full right knee extension to prepare for prothesis in the future.    Follow-Up Instructions: No follow-ups on file.   Ortho Exam  Patient is alert, oriented, no adenopathy, well-dressed, normal affect, normal respiratory effort. Right posterior knee incision without cellulitis or active bleeding.  There was bloody drainage on the old guaze.  The skin on th distal stump has ecchymosis without open wounds.  Positive edema without fluctuance.      Imaging: No results found. No images are attached to the encounter.  Labs: Lab Results  Component Value Date   HGBA1C 7.3 (H) 06/09/2024   HGBA1C 6.1 (H) 10/13/2023   HGBA1C 6.6 05/18/2023   ESRSEDRATE 68 (H) 04/10/2021   ESRSEDRATE 75 (H) 10/20/2017   CRP 1.9 (H) 10/20/2017   REPTSTATUS 08/01/2024 FINAL 07/29/2024   GRAMSTAIN  07/29/2024    ABUNDANT WBC PRESENT, PREDOMINANTLY PMN ABUNDANT GRAM NEGATIVE RODS ABUNDANT GRAM POSITIVE COCCI    CULT  07/29/2024    NO GROWTH 3 DAYS Performed at [facility], [address],  [place], [place] [address]    [facility] ENTEROCOCCUS MARIJO 05/17/2024     Lab Results  Component Value Date   ALBUMIN  2.4 (L) 08/09/2024   ALBUMIN  2.6 (L) 08/08/2024   ALBUMIN  2.5 (L) 08/07/2024    Lab Results  Component Value Date   MG 1.8 02/12/2023   MG 1.7 09/02/2021   MG 2.0 10/13/2017   Lab Results  Component Value Date   VD25OH 10.22 (L) 10/14/2023    No results found for: PREALBUMIN    Latest Ref Rng & Units 08/08/2024    6:33 AM 08/07/2024    5:47 AM 08/06/2024    7:14 AM  CBC EXTENDED  WBC 4.0 - 10.5 K/uL 7.5  8.0  8.8   RBC 4.22 - 5.81 MIL/uL 3.39  3.40  3.66   Hemoglobin 13.0 - 17.0 g/dL 7.3  7.3  7.8   HCT 60.9 - 52.0 % 25.4  25.5  27.2   Platelets 150 - 400 K/uL 162  162  184      There is no height or weight on file to calculate BMI.  Orders:  No orders of the defined types were placed in this encounter.  No orders of the defined types were placed in this encounter.    Procedures: No procedures performed  Clinical Data: No additional findings.  ROS:  All other systems negative, except as noted in the HPI. Review of Systems  Objective: Vital Signs: There were no vitals taken for this visit.  Specialty Comments:  No specialty comments available.  PMFS History: Patient Active Problem List   Diagnosis Date Noted   Mechanical complication of dialysis catheter 07/29/2024   Abscess of right lower extremity 07/29/2024   Chronic health problem 07/29/2024   ESRD on hemodialysis (HCC) 07/29/2024   Anemia of chronic disease 07/29/2024   Nausea and vomiting 01/26/2024   A-V fistula 01/25/2024   Depression 01/24/2024   Nonimmune to hepatitis B virus 10/22/2023   Urinary retention 10/14/2023   Closed intertrochanteric fracture of right hip (HCC) 10/13/2023   Contracture, 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   Pleural effusion 02/16/2023   Allergic rhinitis 11/07/2022   Tobacco dependence 09/11/2022   ESRD (end stage renal disease) on dialysis (HCC)  09/11/2022   GERD with esophagitis 09/02/2021   Acute kidney injury superimposed on CKD 04/10/2021   Achilles tendon contracture, left 03/03/2018   Non-pressure chronic ulcer of other part of left foot limited to breakdown of skin (HCC) 02/02/2018   History of right below knee amputation (HCC) 12/22/2017   Acute posthemorrhagic anemia    Falls 11/24/2017   Severe hypertension 11/24/2017   Anemia 11/24/2017   Uncontrolled type 2 diabetes mellitus with hyperglycemia, with long-term current use of insulin  (HCC) 10/11/2017   ARF (acute renal failure) 10/11/2017   Past Medical History:  Diagnosis Date   Abdominal pain 11/30/2023   Abdominal tenderness 10/14/2023   Allergy    seasonal and environmental   Anemia    hx blood transfusion 04/27/24   Chest pain 12/01/2023   Chronic constipation 05/13/2020   Depression    Diabetes mellitus without complication (HCC) 2019   Dyspnea    hx - SOB with exertion   ESRD on hemodialysis (HCC)    Tues, Thurs, Sat   GERD (gastroesophageal reflux disease)    Hyperlipidemia    Hypertension    Hypertensive urgency 01/24/2024   Hypotension 12/01/2023   Necrotizing fasciitis (HCC) 10/13/2017   Neuromuscular disorder (HCC)    neuropathy   PAD (peripheral artery disease)    Patellar tendinitis 10/12/2023   Secondary hyperparathyroidism    SVT (supraventricular tachycardia)    hx   Wound dehiscence 11/24/2017    Family History  Problem Relation Age of Onset   Diabetes Mellitus II Mother    Depression Mother    Colon polyps Mother    Hypertension Mother    High Cholesterol Mother    Heart disease Father        CABG x 4.  04/2017   Diabetes Father    High Cholesterol Father    Hypertension Father    Heart disease Maternal Grandmother    Heart disease Maternal Grandfather    Colon cancer Neg Hx    Esophageal cancer Neg Hx    Liver cancer Neg Hx    Pancreatic cancer Neg Hx    Rectal cancer Neg Hx    Stomach cancer Neg Hx     Past Surgical  History:  Procedure Laterality Date   A/V FISTULAGRAM N/A 03/17/2024   Procedure: A/V Fistulagram;  Surgeon: Magda Debby SAILOR, MD;  Location: [facility] INVAS[facility] LAB;  Service: Cardiovascular;  Laterality: N/A;   AMPUTATION Right 10/13/2017   Procedure: RIGHT BELOW KNEE AMPUTATION;  Surgeon: Harden Jerona GAILS, MD;  Location: [facility] OR;  Service: Orthopedics;  Laterality: Right;   AMPUTATION Right 11/24/2017   Procedure: AMPUTATION BELOW KNEE REVISION;  Surgeon: Harden Jerona GAILS, MD;  Location: [facility] OR;  Service: Orthopedics;  Laterality: Right;   AMPUTATION TOE Left    AV FISTULA PLACEMENT Left 10/19/2023   Procedure: LEFT BRACHIOCEPHALIC ARTERIOVENOUS (AV) FISTULA CREATION;  Surgeon: Pearline Norman RAMAN, MD;  Location: [facility] OR;  Service: Vascular;  Laterality: Left;   AV FISTULA PLACEMENT Left 01/26/2024   Procedure: LEFT ARTERIOVENOUS (AV) FISTULA CREATION;  Surgeon: Pearline Norman RAMAN, MD;  Location: [facility] OR;  Service: Vascular;  Laterality: Left;   BASCILIC VEIN TRANSPOSITION Left 04/26/2024   Procedure: LEFT SECOND STAGE TRANSPOSITION, VEIN, BASILIC;  Surgeon: Magda Debby SAILOR, MD;  Location: [facility] OR;  Service: Vascular;  Laterality: Left;   BIOPSY  09/02/2021   Procedure: BIOPSY;  Surgeon: Federico Rosario BROCKS, MD;  Location: [facility] ENDOSCOPY;  Service: Gastroenterology;;   BIOPSY  02/18/2023   Procedure: BIOPSY;  Surgeon: Leigh Elspeth SQUIBB, MD;  Location: [facility] ENDOSCOPY;  Service: Gastroenterology;;   COLONOSCOPY  05/11/2019   COLONOSCOPY  2021   3 polyps - tubular adenomas   ESOPHAGOGASTRODUODENOSCOPY (EGD) WITH PROPOFOL  N/A 09/02/2021   Procedure: ESOPHAGOGASTRODUODENOSCOPY (EGD) WITH PROPOFOL ;  Surgeon: Federico Rosario BROCKS, MD;  Location: [facility] ENDOSCOPY;  Service: Gastroenterology;  Laterality: N/A;   ESOPHAGOGASTRODUODENOSCOPY (EGD) WITH PROPOFOL  N/A 02/18/2023   Procedure: ESOPHAGOGASTRODUODENOSCOPY (EGD) WITH PROPOFOL ;  Surgeon: Leigh Elspeth SQUIBB, MD;  Location: [facility] ENDOSCOPY;  Service: Gastroenterology;  Laterality: N/A;    INCISION AND DRAINAGE OF WOUND Left 05/17/2024   Procedure: IRRIGATION AND DEBRIDEMENT WOUND;  Surgeon: Serene Gaile ORN, MD;  Location: [facility] OR;  Service: Vascular;  Laterality: Left;  Arm   INTRAMEDULLARY (IM) NAIL INTERTROCHANTERIC Right 10/15/2023   Procedure: INTRAMEDULLARY (IM) NAIL INTERTROCHANTERIC;  Surgeon: Kendal Franky SQUIBB, MD;  Location: [facility] OR;  Service: Orthopedics;  Laterality: Right;   IR FLUORO GUIDE CV LINE RIGHT  10/15/2023   IR TUNNELED CENTRAL VENOUS CATH PLC W IMG  07/31/2024   IR US  GUIDE VASC ACCESS RIGHT  10/15/2023   IRRIGATION AND DEBRIDEMENT KNEE Right 08/04/2024   Procedure: IRRIGATION AND DEBRIDEMENT KNEE, RIGHT;  Surgeon: Harden Jerona GAILS, MD;  Location: [facility] OR;  Service: Orthopedics;  Laterality: Right;  Debridement Right Popliteal Fossa Abscess   POLYPECTOMY     WISDOM TOOTH EXTRACTION     Social History   Occupational History   Occupation: unemployed  Tobacco Use   Smoking status: Every Day    Current packs/day: 0.50    Average packs/day: 0.5 packs/day for 36.2 years (18.0 ttl pk-yrs)    Types: Cigarettes    Start date: 11/23/1988   Smokeless tobacco: Never   Tobacco comments:    6 cigarettes daily- khj 05/24/2024        Started smoking at 53 years old    Smoked 1PPD at his heaviest  Vaping Use   Vaping status: Never Used  Substance and Sexual Activity   Alcohol use: No   Drug use: No   Sexual activity: Not Currently

## 2024-08-22 ENCOUNTER — Encounter: Payer: Self-pay | Admitting: Physician Assistant

## 2024-08-22 DIAGNOSIS — D631 Anemia in chronic kidney disease: Secondary | ICD-10-CM | POA: Diagnosis not present

## 2024-08-22 DIAGNOSIS — Z7409 Other reduced mobility: Secondary | ICD-10-CM | POA: Diagnosis not present

## 2024-08-22 DIAGNOSIS — M71061 Abscess of bursa, right knee: Secondary | ICD-10-CM | POA: Diagnosis not present

## 2024-08-22 DIAGNOSIS — I129 Hypertensive chronic kidney disease with stage 1 through stage 4 chronic kidney disease, or unspecified chronic kidney disease: Secondary | ICD-10-CM | POA: Diagnosis not present

## 2024-08-22 DIAGNOSIS — N186 End stage renal disease: Secondary | ICD-10-CM | POA: Diagnosis not present

## 2024-08-22 DIAGNOSIS — Z89511 Acquired absence of right leg below knee: Secondary | ICD-10-CM | POA: Diagnosis not present

## 2024-08-22 DIAGNOSIS — N2581 Secondary hyperparathyroidism of renal origin: Secondary | ICD-10-CM | POA: Diagnosis not present

## 2024-08-22 DIAGNOSIS — R531 Weakness: Secondary | ICD-10-CM | POA: Diagnosis not present

## 2024-08-22 DIAGNOSIS — L02415 Cutaneous abscess of right lower limb: Secondary | ICD-10-CM | POA: Diagnosis not present

## 2024-08-22 DIAGNOSIS — I152 Hypertension secondary to endocrine disorders: Secondary | ICD-10-CM | POA: Diagnosis not present

## 2024-08-22 DIAGNOSIS — T8249XA Other complication of vascular dialysis catheter, initial encounter: Secondary | ICD-10-CM | POA: Diagnosis not present

## 2024-08-22 DIAGNOSIS — Z992 Dependence on renal dialysis: Secondary | ICD-10-CM | POA: Diagnosis not present

## 2024-08-22 DIAGNOSIS — D689 Coagulation defect, unspecified: Secondary | ICD-10-CM | POA: Diagnosis not present

## 2024-08-22 DIAGNOSIS — D509 Iron deficiency anemia, unspecified: Secondary | ICD-10-CM | POA: Diagnosis not present

## 2024-08-23 DIAGNOSIS — R531 Weakness: Secondary | ICD-10-CM | POA: Diagnosis not present

## 2024-08-23 DIAGNOSIS — Z7409 Other reduced mobility: Secondary | ICD-10-CM | POA: Diagnosis not present

## 2024-08-23 DIAGNOSIS — Z992 Dependence on renal dialysis: Secondary | ICD-10-CM | POA: Diagnosis not present

## 2024-08-23 DIAGNOSIS — R062 Wheezing: Secondary | ICD-10-CM | POA: Diagnosis not present

## 2024-08-23 DIAGNOSIS — R0602 Shortness of breath: Secondary | ICD-10-CM | POA: Diagnosis not present

## 2024-08-23 DIAGNOSIS — I1311 Hypertensive heart and chronic kidney disease without heart failure, with stage 5 chronic kidney disease, or end stage renal disease: Secondary | ICD-10-CM | POA: Diagnosis not present

## 2024-08-23 DIAGNOSIS — N186 End stage renal disease: Secondary | ICD-10-CM | POA: Diagnosis not present

## 2024-08-24 DIAGNOSIS — Z7409 Other reduced mobility: Secondary | ICD-10-CM | POA: Diagnosis not present

## 2024-08-24 DIAGNOSIS — N186 End stage renal disease: Secondary | ICD-10-CM | POA: Diagnosis not present

## 2024-08-24 DIAGNOSIS — R531 Weakness: Secondary | ICD-10-CM | POA: Diagnosis not present

## 2024-08-24 DIAGNOSIS — I1311 Hypertensive heart and chronic kidney disease without heart failure, with stage 5 chronic kidney disease, or end stage renal disease: Secondary | ICD-10-CM | POA: Diagnosis not present

## 2024-08-24 DIAGNOSIS — Z992 Dependence on renal dialysis: Secondary | ICD-10-CM | POA: Diagnosis not present

## 2024-08-24 NOTE — Progress Notes (Signed)
 [name] [name]                                          MRN: [id number]   08/24/2024   The VBCI Quality Team Specialist reviewed this patient medical record for the purposes of chart review for care gap closure. The following were reviewed: chart review for care gap closure-diabetic eye exam.    VBCI Quality Team

## 2024-08-25 ENCOUNTER — Ambulatory Visit: Admitting: Student in an Organized Health Care Education/Training Program

## 2024-08-25 DIAGNOSIS — Z992 Dependence on renal dialysis: Secondary | ICD-10-CM | POA: Diagnosis not present

## 2024-08-25 DIAGNOSIS — N186 End stage renal disease: Secondary | ICD-10-CM | POA: Diagnosis not present

## 2024-08-25 DIAGNOSIS — I1311 Hypertensive heart and chronic kidney disease without heart failure, with stage 5 chronic kidney disease, or end stage renal disease: Secondary | ICD-10-CM | POA: Diagnosis not present

## 2024-08-28 ENCOUNTER — Encounter: Admitting: Physician Assistant

## 2024-08-28 ENCOUNTER — Encounter (HOSPITAL_COMMUNITY): Payer: Self-pay | Admitting: Gastroenterology

## 2024-08-28 ENCOUNTER — Telehealth: Payer: Self-pay | Admitting: Gastroenterology

## 2024-08-28 DIAGNOSIS — Z992 Dependence on renal dialysis: Secondary | ICD-10-CM | POA: Diagnosis not present

## 2024-08-28 DIAGNOSIS — N186 End stage renal disease: Secondary | ICD-10-CM | POA: Diagnosis not present

## 2024-08-28 DIAGNOSIS — R531 Weakness: Secondary | ICD-10-CM | POA: Diagnosis not present

## 2024-08-28 DIAGNOSIS — Z7409 Other reduced mobility: Secondary | ICD-10-CM | POA: Diagnosis not present

## 2024-08-28 DIAGNOSIS — R001 Bradycardia, unspecified: Secondary | ICD-10-CM | POA: Diagnosis not present

## 2024-08-28 DIAGNOSIS — I1311 Hypertensive heart and chronic kidney disease without heart failure, with stage 5 chronic kidney disease, or end stage renal disease: Secondary | ICD-10-CM | POA: Diagnosis not present

## 2024-08-28 NOTE — Telephone Encounter (Addendum)
 Procedure:Colonoscopy Procedure date: 09/04/24 Procedure location: [facility] Arrival Time: 8:30 am Spoke with the patient Y/N:   No, I left a detailed message on [phone] on 08/28/24 @ 10:20 am for the patient to return call   Any prep concerns? ___  Has the patient obtained the prep from the pharmacy ? ___ Do you have a care partner and transportation: ___ Any additional concerns? ___

## 2024-08-28 NOTE — Progress Notes (Signed)
 Preop instructions for: [name] [name]   Date of Birth:  [birth year]                Date of Procedure:  05/31/24 Procedure:  Colonoscopy Surgeon: Dr. Legrand Facility contact:  [facility]       Phone:  [phone] RN contact name/phone#:   Allena         and Fax #: [phone]   Transportation contact phone#: Facility Transportation Time to arrive at [facility]: 0830 am   Report to: Admitting - Go through main entrance of hospital and tell front desk you are having a procedure done, and they will show how to get to admitting dept.   **Patient will be clear liquids all day prior to procedure drink prep in the evening and also into morning of procedure at set times- full instructions and prep solution provided by GI Office, if do not have this info please call the GI office to be re-sent at [phone].**  May have the following until 0600 am day of procedure  CLEAR LIQUID DIET Water Black Coffee (sugar ok, NO MILK/CREAM OR CREAMERS)  Tea (sugar ok, NO MILK/CREAM OR CREAMERS) regular and decaf                             Plain Jell-O (NO RED)                                           Fruit ices (not with fruit pulp, NO RED)                                     Popsicles (NO RED)                                                                  Juice: apple, WHITE grape, WHITE cranberry Sports drinks like Gatorade (NO RED)   Take these morning medications only with sips of water.(or give through gastrostomy or feeding tube):   Take- Gabapentin , Prozac , Hydralazine , Coreg , Cardizem , Protonix , Lantus  (IF takes at night take  dose PM Before procedure, IF takes only in AM hold AM of procedure) HOLD- Phoslo , NO diabetic medicine AM of procedure   Note: No Insulin  or Diabetic meds should be given or taken the morning of the procedure!   Please send day of procedure: current med list and meds last taken that day, confirm nothing by mouth status from what time,  Patient Demographic info( to include DNR status, problem list, allergies) Bring Insurance card and ID, can shower & use deodorant but nothing else on skin.Leave all jewelry and other valuables at place where living (anything with metal- remove) Any questions prior to procedure call pre surg nurse Elenor [phone] Any questions DAY OF procedure, call ENDO DEPT. [phone]   Sent from :Elenor, RN-[facility]   Phone:[phone] [phone]

## 2024-08-28 NOTE — Telephone Encounter (Signed)
 Inbound call from patient stating he would like to reschedule upcoming procedure on 09/04/24 at [facility] due to being in a rehab facility and not having the medication. Patient stated the prep medication is at his house but is still wanting reschedule.  Please advise  Thank you

## 2024-08-29 ENCOUNTER — Other Ambulatory Visit: Payer: Self-pay | Admitting: Licensed Clinical Social Worker

## 2024-08-29 ENCOUNTER — Ambulatory Visit

## 2024-08-29 NOTE — Telephone Encounter (Signed)
 Attempted to reach patient. No answer, left VM for patient to return call.

## 2024-08-30 ENCOUNTER — Other Ambulatory Visit: Payer: Self-pay

## 2024-08-30 DIAGNOSIS — Z7409 Other reduced mobility: Secondary | ICD-10-CM | POA: Diagnosis not present

## 2024-08-30 DIAGNOSIS — Z992 Dependence on renal dialysis: Secondary | ICD-10-CM | POA: Diagnosis not present

## 2024-08-30 DIAGNOSIS — T8131XA Disruption of external operation (surgical) wound, not elsewhere classified, initial encounter: Secondary | ICD-10-CM | POA: Diagnosis not present

## 2024-08-30 DIAGNOSIS — Z89511 Acquired absence of right leg below knee: Secondary | ICD-10-CM | POA: Diagnosis not present

## 2024-08-30 DIAGNOSIS — R531 Weakness: Secondary | ICD-10-CM | POA: Diagnosis not present

## 2024-08-30 DIAGNOSIS — N186 End stage renal disease: Secondary | ICD-10-CM | POA: Diagnosis not present

## 2024-08-30 DIAGNOSIS — Z91199 Patient's noncompliance with other medical treatment and regimen due to unspecified reason: Secondary | ICD-10-CM | POA: Diagnosis not present

## 2024-08-30 DIAGNOSIS — R0989 Other specified symptoms and signs involving the circulatory and respiratory systems: Secondary | ICD-10-CM | POA: Diagnosis not present

## 2024-08-30 DIAGNOSIS — E875 Hyperkalemia: Secondary | ICD-10-CM | POA: Diagnosis not present

## 2024-08-30 NOTE — Telephone Encounter (Signed)
 Attempted to reach patient. No answer, left VM for patient to return call.

## 2024-08-30 NOTE — Telephone Encounter (Signed)
 I have attempted to call the pt x2. No answer, vm left both times. Colonoscopy for 09/04/24 cancelled per original message from 10/6. Reminder message sent to call pt to reschedule. Routing to provider for FYI.

## 2024-09-04 ENCOUNTER — Ambulatory Visit (HOSPITAL_COMMUNITY): Admit: 2024-09-04 | Admitting: Gastroenterology

## 2024-09-04 ENCOUNTER — Ambulatory Visit (INDEPENDENT_AMBULATORY_CARE_PROVIDER_SITE_OTHER): Admitting: Physician Assistant

## 2024-09-04 ENCOUNTER — Encounter (HOSPITAL_COMMUNITY): Payer: Self-pay

## 2024-09-04 ENCOUNTER — Encounter: Payer: Self-pay | Admitting: Physician Assistant

## 2024-09-04 DIAGNOSIS — R531 Weakness: Secondary | ICD-10-CM | POA: Diagnosis not present

## 2024-09-04 DIAGNOSIS — E1165 Type 2 diabetes mellitus with hyperglycemia: Secondary | ICD-10-CM | POA: Diagnosis not present

## 2024-09-04 DIAGNOSIS — N186 End stage renal disease: Secondary | ICD-10-CM | POA: Diagnosis not present

## 2024-09-04 DIAGNOSIS — E875 Hyperkalemia: Secondary | ICD-10-CM | POA: Diagnosis not present

## 2024-09-04 DIAGNOSIS — Z992 Dependence on renal dialysis: Secondary | ICD-10-CM | POA: Diagnosis not present

## 2024-09-04 DIAGNOSIS — Z89511 Acquired absence of right leg below knee: Secondary | ICD-10-CM | POA: Diagnosis not present

## 2024-09-04 DIAGNOSIS — Z7409 Other reduced mobility: Secondary | ICD-10-CM | POA: Diagnosis not present

## 2024-09-04 DIAGNOSIS — L97811 Non-pressure chronic ulcer of other part of right lower leg limited to breakdown of skin: Secondary | ICD-10-CM

## 2024-09-04 DIAGNOSIS — I1311 Hypertensive heart and chronic kidney disease without heart failure, with stage 5 chronic kidney disease, or end stage renal disease: Secondary | ICD-10-CM | POA: Diagnosis not present

## 2024-09-04 SURGERY — COLONOSCOPY
Anesthesia: Monitor Anesthesia Care

## 2024-09-04 NOTE — Progress Notes (Signed)
 Office Visit Note   Patient: [name] [name]           Date of Birth: 05-08-1971           MRN: [id number] Visit Date: 09/04/2024              Requested by: Adele Song, MD [address] [place],  [place] [address] PCP: Adele Song, MD  Chief Complaint  Patient presents with   Right Knee - Routine Post Op    08/04/2024 I&D right knee          HPI: 53 y/o male with history of right BKA underwent Excisional debridement abscess popliteal fossa right knee with excision skin soft tissue muscle fascia and pseudocyst. He is currently in a skilled facility.  He has swelling and he states he hit the posterior knee on something in the bathroom that caused it to bleed.    He has an open wound posterior wound since the sutures came out.  He states it drains bloody drainage.  He denies fever and chills.  He also has edema in the stump.    He is home from SNF.  They are worried about his mobility since he can not wear his prothesis.  They live in a modular home and the door ways don't accommodate his WC or walker.  The SNF was suppose to set up [facility] PT.  If they don't hear from the PT they will call the SNF back for information.    Assessment & Plan: Visit Diagnoses: No diagnosis found.  Plan: Vashe wet to dry packing daily and peri wound cleaning.  Ace wrap for compression over the black stump sock.  He states he can no longer fit in the prosthesis due to the edema and the wound.  We will wait until the wound heals   and the stump edema dissipates.  He may need to go back to [facility] for adjustments.   Follow-Up Instructions: No follow-ups on file.   Ortho Exam  Patient is alert, oriented, no adenopathy, well-dressed, normal affect, normal respiratory effort. The wound has healthy skin edges, no cellulitis.  The wound is 2 cm x 3 cm and 0.5 cm depth.  No active drainage.  The stump appears well healed with mild edema and no fluctuance.          Imaging: No results found. No images  are attached to the encounter.  Labs: Lab Results  Component Value Date   HGBA1C 7.3 (H) 06/09/2024   HGBA1C 6.1 (H) 10/13/2023   HGBA1C 6.6 05/18/2023   ESRSEDRATE 68 (H) 04/10/2021   ESRSEDRATE 75 (H) 10/20/2017   CRP 1.9 (H) 10/20/2017   REPTSTATUS 08/01/2024 FINAL 07/29/2024   GRAMSTAIN  07/29/2024    ABUNDANT WBC PRESENT, PREDOMINANTLY PMN ABUNDANT GRAM NEGATIVE RODS ABUNDANT GRAM POSITIVE COCCI    CULT  07/29/2024    NO GROWTH 3 DAYS Performed at [facility], [address], [place], [place] [address]    [facility] ENTEROCOCCUS MARIJO 05/17/2024     Lab Results  Component Value Date   ALBUMIN  2.4 (L) 08/09/2024   ALBUMIN  2.6 (L) 08/08/2024   ALBUMIN  2.5 (L) 08/07/2024    Lab Results  Component Value Date   MG 1.8 02/12/2023   MG 1.7 09/02/2021   MG 2.0 10/13/2017   Lab Results  Component Value Date   VD25OH 10.22 (L) 10/14/2023    No results found for: PREALBUMIN    Latest Ref Rng &  Units 08/08/2024    6:33 AM 08/07/2024    5:47 AM 08/06/2024    7:14 AM  CBC EXTENDED  WBC 4.0 - 10.5 K/uL 7.5  8.0  8.8   RBC 4.22 - 5.81 MIL/uL 3.39  3.40  3.66   Hemoglobin 13.0 - 17.0 g/dL 7.3  7.3  7.8   HCT 60.9 - 52.0 % 25.4  25.5  27.2   Platelets 150 - 400 K/uL 162  162  184      There is no height or weight on file to calculate BMI.  Orders:  No orders of the defined types were placed in this encounter.  No orders of the defined types were placed in this encounter.    Procedures: No procedures performed  Clinical Data: No additional findings.  ROS:  All other systems negative, except as noted in the HPI. Review of Systems  Objective: Vital Signs: There were no vitals taken for this visit.  Specialty Comments:  No specialty comments available.  PMFS History: Patient Active Problem List   Diagnosis Date Noted   Mechanical complication of dialysis catheter 07/29/2024   Abscess of right lower extremity 07/29/2024   Chronic health  problem 07/29/2024   ESRD on hemodialysis (HCC) 07/29/2024   Anemia of chronic disease 07/29/2024   Nausea and vomiting 01/26/2024   A-V fistula 01/25/2024   Depression 01/24/2024   Nonimmune to hepatitis B virus 10/22/2023   Urinary retention 10/14/2023   Closed intertrochanteric fracture of right hip (HCC) 10/13/2023   Contracture, 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   Pleural effusion 02/16/2023   Allergic rhinitis 11/07/2022   Tobacco dependence 09/11/2022   ESRD (end stage renal disease) on dialysis (HCC) 09/11/2022   GERD with esophagitis 09/02/2021   Acute kidney injury superimposed on CKD 04/10/2021   Achilles tendon contracture, left 03/03/2018   Non-pressure chronic ulcer of other part of left foot limited to breakdown of skin (HCC) 02/02/2018   History of right below knee amputation (HCC) 12/22/2017   Acute posthemorrhagic anemia    Falls 11/24/2017   Severe hypertension 11/24/2017   Anemia 11/24/2017   Uncontrolled type 2 diabetes mellitus with hyperglycemia, with long-term current use of insulin  (HCC) 10/11/2017   ARF (acute renal failure) 10/11/2017   Past Medical History:  Diagnosis Date   Abdominal pain 11/30/2023   Abdominal tenderness 10/14/2023   Allergy    seasonal and environmental   Anemia    hx blood transfusion 04/27/24   Chest pain 12/01/2023   Chronic constipation 05/13/2020   Depression    Diabetes mellitus without complication (HCC) 2019   Dyspnea    hx - SOB with exertion   ESRD on hemodialysis (HCC)    Tues, Thurs, Sat   GERD (gastroesophageal reflux disease)    Hyperlipidemia    Hypertension    Hypertensive urgency 01/24/2024   Hypotension 12/01/2023   Necrotizing fasciitis (HCC) 10/13/2017   Neuromuscular disorder (HCC)    neuropathy   PAD (peripheral artery disease)    Patellar tendinitis 10/12/2023   Secondary hyperparathyroidism    SVT (supraventricular tachycardia)    hx   Wound dehiscence 11/24/2017    Family History  Problem Relation Age  of Onset   Diabetes Mellitus II Mother    Depression Mother    Colon polyps Mother    Hypertension Mother    High Cholesterol Mother    Heart disease Father        CABG x 4.  04/2017   Diabetes Father  High Cholesterol Father    Hypertension Father    Heart disease Maternal Grandmother    Heart disease Maternal Grandfather    Colon cancer Neg Hx    Esophageal cancer Neg Hx    Liver cancer Neg Hx    Pancreatic cancer Neg Hx    Rectal cancer Neg Hx    Stomach cancer Neg Hx     Past Surgical History:  Procedure Laterality Date   A/V FISTULAGRAM N/A 03/17/2024   Procedure: A/V Fistulagram;  Surgeon: Magda Debby SAILOR, MD;  Location: [facility] INVASIVE CV LAB;  Service: Cardiovascular;  Laterality: N/A;   AMPUTATION Right 10/13/2017   Procedure: RIGHT BELOW KNEE AMPUTATION;  Surgeon: Harden Jerona GAILS, MD;  Location: [facility] OR;  Service: Orthopedics;  Laterality: Right;   AMPUTATION Right 11/24/2017   Procedure: AMPUTATION BELOW KNEE REVISION;  Surgeon: Harden Jerona GAILS, MD;  Location: [facility] OR;  Service: Orthopedics;  Laterality: Right;   AMPUTATION TOE Left    AV FISTULA PLACEMENT Left 10/19/2023   Procedure: LEFT BRACHIOCEPHALIC ARTERIOVENOUS (AV) FISTULA CREATION;  Surgeon: Pearline Norman RAMAN, MD;  Location: [facility] OR;  Service: Vascular;  Laterality: Left;   AV FISTULA PLACEMENT Left 01/26/2024   Procedure: LEFT ARTERIOVENOUS (AV) FISTULA CREATION;  Surgeon: Pearline Norman RAMAN, MD;  Location: [facility] OR;  Service: Vascular;  Laterality: Left;   BASCILIC VEIN TRANSPOSITION Left 04/26/2024   Procedure: LEFT SECOND STAGE TRANSPOSITION, VEIN, BASILIC;  Surgeon: Magda Debby SAILOR, MD;  Location: [facility] OR;  Service: Vascular;  Laterality: Left;   BIOPSY  09/02/2021   Procedure: BIOPSY;  Surgeon: Federico Rosario BROCKS, MD;  Location: [facility] ENDOSCOPY;  Service: Gastroenterology;;   BIOPSY  02/18/2023   Procedure: BIOPSY;  Surgeon: Leigh Elspeth SQUIBB, MD;  Location: [facility] ENDOSCOPY;  Service: Gastroenterology;;   COLONOSCOPY  05/11/2019    COLONOSCOPY  2021   3 polyps - tubular adenomas   ESOPHAGOGASTRODUODENOSCOPY (EGD) WITH PROPOFOL  N/A 09/02/2021   Procedure: ESOPHAGOGASTRODUODENOSCOPY (EGD) WITH PROPOFOL ;  Surgeon: Federico Rosario BROCKS, MD;  Location: [facility] ENDOSCOPY;  Service: Gastroenterology;  Laterality: N/A;   ESOPHAGOGASTRODUODENOSCOPY (EGD) WITH PROPOFOL  N/A 02/18/2023   Procedure: ESOPHAGOGASTRODUODENOSCOPY (EGD) WITH PROPOFOL ;  Surgeon: Leigh Elspeth SQUIBB, MD;  Location: [facility] ENDOSCOPY;  Service: Gastroenterology;  Laterality: N/A;   INCISION AND DRAINAGE OF WOUND Left 05/17/2024   Procedure: IRRIGATION AND DEBRIDEMENT WOUND;  Surgeon: Serene Gaile ORN, MD;  Location: [facility] OR;  Service: Vascular;  Laterality: Left;  Arm   INTRAMEDULLARY (IM) NAIL INTERTROCHANTERIC Right 10/15/2023   Procedure: INTRAMEDULLARY (IM) NAIL INTERTROCHANTERIC;  Surgeon: Kendal Franky SQUIBB, MD;  Location: [facility] OR;  Service: Orthopedics;  Laterality: Right;   IR FLUORO GUIDE CV LINE RIGHT  10/15/2023   IR TUNNELED CENTRAL VENOUS CATH PLC W IMG  07/31/2024   IR US  GUIDE VASC ACCESS RIGHT  10/15/2023   IRRIGATION AND DEBRIDEMENT KNEE Right 08/04/2024   Procedure: IRRIGATION AND DEBRIDEMENT KNEE, RIGHT;  Surgeon: Harden Jerona GAILS, MD;  Location: [facility] OR;  Service: Orthopedics;  Laterality: Right;  Debridement Right Popliteal Fossa Abscess   POLYPECTOMY     WISDOM TOOTH EXTRACTION     Social History   Occupational History   Occupation: unemployed  Tobacco Use   Smoking status: Every Day    Current packs/day: 0.50    Average packs/day: 0.5 packs/day for 36.3 years (18.0 ttl pk-yrs)    Types: Cigarettes    Start date: 11/23/1988   Smokeless tobacco: Never   Tobacco comments:    6 cigarettes daily- khj 05/24/2024  Started smoking at 53 years old    Smoked 1PPD at his heaviest  Vaping Use   Vaping status: Never Used  Substance and Sexual Activity   Alcohol use: No   Drug use: No   Sexual activity: Not Currently

## 2024-09-05 ENCOUNTER — Encounter (HOSPITAL_COMMUNITY): Payer: Self-pay

## 2024-09-05 ENCOUNTER — Other Ambulatory Visit: Payer: Self-pay

## 2024-09-05 ENCOUNTER — Emergency Department (HOSPITAL_COMMUNITY)
Admission: EM | Admit: 2024-09-05 | Discharge: 2024-09-11 | Disposition: A | Discharge status: Home / Self Care | Attending: Emergency Medicine | Admitting: Emergency Medicine

## 2024-09-05 ENCOUNTER — Telehealth: Payer: Self-pay

## 2024-09-05 DIAGNOSIS — I120 Hypertensive chronic kidney disease with stage 5 chronic kidney disease or end stage renal disease: Secondary | ICD-10-CM | POA: Insufficient documentation

## 2024-09-05 DIAGNOSIS — Z794 Long term (current) use of insulin: Secondary | ICD-10-CM | POA: Diagnosis not present

## 2024-09-05 DIAGNOSIS — Z992 Dependence on renal dialysis: Secondary | ICD-10-CM | POA: Diagnosis not present

## 2024-09-05 DIAGNOSIS — Z79899 Other long term (current) drug therapy: Secondary | ICD-10-CM | POA: Insufficient documentation

## 2024-09-05 DIAGNOSIS — N186 End stage renal disease: Secondary | ICD-10-CM | POA: Insufficient documentation

## 2024-09-05 DIAGNOSIS — E1122 Type 2 diabetes mellitus with diabetic chronic kidney disease: Secondary | ICD-10-CM | POA: Insufficient documentation

## 2024-09-05 DIAGNOSIS — R531 Weakness: Secondary | ICD-10-CM | POA: Insufficient documentation

## 2024-09-05 DIAGNOSIS — D631 Anemia in chronic kidney disease: Secondary | ICD-10-CM | POA: Diagnosis not present

## 2024-09-05 DIAGNOSIS — M7989 Other specified soft tissue disorders: Secondary | ICD-10-CM | POA: Insufficient documentation

## 2024-09-05 LAB — COMPREHENSIVE METABOLIC PANEL WITH GFR
ALT: 10 U/L (ref 0–44)
AST: 16 U/L (ref 15–41)
Albumin: 3 g/dL — ABNORMAL LOW (ref 3.5–5.0)
Alkaline Phosphatase: 103 U/L (ref 38–126)
Anion gap: 14 (ref 5–15)
BUN: 16 mg/dL (ref 6–20)
CO2: 23 mmol/L (ref 22–32)
Calcium: 8.8 mg/dL — ABNORMAL LOW (ref 8.9–10.3)
Chloride: 94 mmol/L — ABNORMAL LOW (ref 98–111)
Creatinine, Ser: 4.1 mg/dL — ABNORMAL HIGH (ref 0.61–1.24)
GFR, Estimated: 17 mL/min — ABNORMAL LOW (ref >60)
Glucose, Bld: 105 mg/dL — ABNORMAL HIGH (ref 70–99)
Potassium: 4.1 mmol/L (ref 3.5–5.1)
Sodium: 131 mmol/L — ABNORMAL LOW (ref 135–145)
Total Bilirubin: 0.8 mg/dL (ref 0.0–1.2)
Total Protein: 6.5 g/dL (ref 6.5–8.1)

## 2024-09-05 LAB — CBC
HCT: 31 % — ABNORMAL LOW (ref 39.0–52.0)
Hemoglobin: 9 g/dL — ABNORMAL LOW (ref 13.0–17.0)
MCH: 22.4 pg — ABNORMAL LOW (ref 26.0–34.0)
MCHC: 29 g/dL — ABNORMAL LOW (ref 30.0–36.0)
MCV: 77.3 fL — ABNORMAL LOW (ref 80.0–100.0)
Platelets: 169 K/uL (ref 150–400)
RBC: 4.01 MIL/uL — ABNORMAL LOW (ref 4.22–5.81)
RDW: 25.5 % — ABNORMAL HIGH (ref 11.5–15.5)
WBC: 8.2 K/uL (ref 4.0–10.5)
nRBC: 0 % (ref 0.0–0.2)

## 2024-09-05 LAB — CBG MONITORING, ED: Glucose-Capillary: 140 mg/dL — ABNORMAL HIGH (ref 70–99)

## 2024-09-05 MED ORDER — PANTOPRAZOLE SODIUM 40 MG PO TBEC
40.0000 mg | DELAYED_RELEASE_TABLET | Freq: Every day | ORAL | Status: DC
  Administered 2024-09-06 – 2024-09-11 (×5): 40 mg via ORAL
  Filled 2024-09-05 (×6): qty 1

## 2024-09-05 MED ORDER — INSULIN ASPART 100 UNIT/ML IJ SOLN
0.0000 [IU] | Freq: Every day | INTRAMUSCULAR | Status: DC
  Administered 2024-09-06: 2 [IU] via SUBCUTANEOUS
  Administered 2024-09-09: 3 [IU] via SUBCUTANEOUS

## 2024-09-05 MED ORDER — FLUOXETINE HCL 20 MG PO CAPS
40.0000 mg | ORAL_CAPSULE | Freq: Every day | ORAL | Status: DC
  Administered 2024-09-06 – 2024-09-11 (×6): 40 mg via ORAL
  Filled 2024-09-05 (×6): qty 2

## 2024-09-05 MED ORDER — ALBUTEROL SULFATE HFA 108 (90 BASE) MCG/ACT IN AERS: 2.0000 | INHALATION_SPRAY | Freq: Four times a day (QID) | RESPIRATORY_TRACT | Status: DC | PRN

## 2024-09-05 MED ORDER — ACETAMINOPHEN 500 MG PO TABS
1000.0000 mg | ORAL_TABLET | Freq: Four times a day (QID) | ORAL | Status: DC
  Administered 2024-09-06 – 2024-09-11 (×16): 1000 mg via ORAL
  Filled 2024-09-05 (×18): qty 2

## 2024-09-05 MED ORDER — POLYETHYLENE GLYCOL 3350 17 G PO PACK
17.0000 g | PACK | Freq: Every day | ORAL | Status: DC
  Administered 2024-09-06 – 2024-09-11 (×2): 17 g via ORAL
  Filled 2024-09-05 (×6): qty 1

## 2024-09-05 MED ORDER — OXYCODONE HCL 5 MG PO TABS
10.0000 mg | ORAL_TABLET | Freq: Three times a day (TID) | ORAL | Status: DC | PRN
  Administered 2024-09-06 – 2024-09-10 (×8): 10 mg via ORAL
  Filled 2024-09-05 (×7): qty 2

## 2024-09-05 MED ORDER — INSULIN ASPART 100 UNIT/ML IJ SOLN
2.0000 [IU] | Freq: Three times a day (TID) | INTRAMUSCULAR | Status: DC
  Administered 2024-09-06 – 2024-09-11 (×12): 2 [IU] via SUBCUTANEOUS

## 2024-09-05 MED ORDER — CARVEDILOL 12.5 MG PO TABS
12.5000 mg | ORAL_TABLET | Freq: Two times a day (BID) | ORAL | Status: DC
  Administered 2024-09-06 – 2024-09-11 (×10): 12.5 mg via ORAL
  Filled 2024-09-05 (×10): qty 1

## 2024-09-05 MED ORDER — DILTIAZEM HCL ER COATED BEADS 300 MG PO CP24
300.0000 mg | ORAL_CAPSULE | Freq: Every day | ORAL | Status: AC
Start: 2024-09-05 — End: ?
  Administered 2024-09-06 – 2024-09-07 (×2): 300 mg via ORAL
  Filled 2024-09-05 (×5): qty 1

## 2024-09-05 MED ORDER — HYDRALAZINE HCL 25 MG PO TABS
100.0000 mg | ORAL_TABLET | Freq: Two times a day (BID) | ORAL | Status: DC
Start: 2024-09-05 — End: 2024-09-11
  Administered 2024-09-06 – 2024-09-11 (×10): 100 mg via ORAL
  Filled 2024-09-05 (×11): qty 2
  Filled 2024-09-05: qty 4
  Filled 2024-09-05 (×4): qty 2

## 2024-09-05 MED ORDER — INSULIN ASPART 100 UNIT/ML IJ SOLN
0.0000 [IU] | Freq: Three times a day (TID) | INTRAMUSCULAR | Status: DC
  Administered 2024-09-06: 1 [IU] via SUBCUTANEOUS
  Administered 2024-09-06: 3 [IU] via SUBCUTANEOUS
  Administered 2024-09-06: 2 [IU] via SUBCUTANEOUS
  Administered 2024-09-07: 3 [IU] via SUBCUTANEOUS
  Administered 2024-09-07 (×2): 5 [IU] via SUBCUTANEOUS
  Administered 2024-09-08 (×2): 2 [IU] via SUBCUTANEOUS
  Administered 2024-09-09: 1 [IU] via SUBCUTANEOUS
  Administered 2024-09-10: 2 [IU] via SUBCUTANEOUS
  Administered 2024-09-10 – 2024-09-11 (×3): 1 [IU] via SUBCUTANEOUS
  Administered 2024-09-11: 3 [IU] via SUBCUTANEOUS

## 2024-09-05 MED ORDER — ATORVASTATIN CALCIUM 40 MG PO TABS
40.0000 mg | ORAL_TABLET | Freq: Every day | ORAL | Status: DC
  Administered 2024-09-06 – 2024-09-11 (×6): 40 mg via ORAL
  Filled 2024-09-05 (×8): qty 1

## 2024-09-05 MED ORDER — METHOCARBAMOL 500 MG PO TABS
750.0000 mg | ORAL_TABLET | Freq: Three times a day (TID) | ORAL | Status: DC | PRN
  Administered 2024-09-06 – 2024-09-09 (×3): 750 mg via ORAL
  Filled 2024-09-05 (×3): qty 2

## 2024-09-05 MED ORDER — GABAPENTIN 300 MG PO CAPS
300.0000 mg | ORAL_CAPSULE | Freq: Two times a day (BID) | ORAL | Status: AC
Start: 2024-09-05 — End: ?
  Administered 2024-09-06 – 2024-09-08 (×5): 300 mg via ORAL
  Filled 2024-09-05 (×6): qty 1

## 2024-09-05 NOTE — TOC Initial Note (Signed)
 Transition of Care [facility]) - Initial/Assessment Note    Patient Details  Name: [name] [name] MRN: [id number] Date of Birth: [birth year]  [facility] CM/SW Contact:  Hartley KATHEE Robertson, LCSWA Phone Number: 09/05/2024, 10:37 PM  Clinical Narrative:                  CSW received consult, attempted to speak with pt via phone, no answer, RN made aware to assist, from chart review pt was at [facility] for STR from 9/17-yesterday, consult for SNF as pt states he cannot care for himself. Possible barrier to SNF placement may include pt being in copay status, will try to speak with pt again.         Patient Goals and CMS Choice            Expected Discharge Plan and Services                                              Prior Living Arrangements/Services                       Activities of Daily Living      Permission Sought/Granted                  Emotional Assessment              Admission diagnosis:  weakness Patient Active Problem List   Diagnosis Date Noted   Mechanical complication of dialysis catheter 07/29/2024   Abscess of right lower extremity 07/29/2024   Chronic health problem 07/29/2024   ESRD on hemodialysis (HCC) 07/29/2024   Anemia of chronic disease 07/29/2024   Nausea and vomiting 01/26/2024   A-V fistula 01/25/2024   Depression 01/24/2024   Nonimmune to hepatitis B virus 10/22/2023   Urinary retention 10/14/2023   Closed intertrochanteric fracture of right hip (HCC) 10/13/2023   Contracture, 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   Pleural effusion 02/16/2023   Allergic rhinitis 11/07/2022   Tobacco dependence 09/11/2022   ESRD (end stage renal disease) on dialysis (HCC) 09/11/2022   GERD with esophagitis 09/02/2021   Acute kidney injury superimposed on CKD 04/10/2021   Achilles tendon contracture, left 03/03/2018   Non-pressure chronic ulcer of other part of left foot limited to breakdown of skin (HCC) 02/02/2018   History  of right below knee amputation (HCC) 12/22/2017   Acute posthemorrhagic anemia    Falls 11/24/2017   Severe hypertension 11/24/2017   Anemia 11/24/2017   Uncontrolled type 2 diabetes mellitus with hyperglycemia, with long-term current use of insulin  (HCC) 10/11/2017   ARF (acute renal failure) 10/11/2017   PCP:  Adele Song, MD Pharmacy:   [facility] [phone] [place], [place] - [address] AT [address] [place] [address] Phone: [phone] Fax: [phone]  [facility] [address] [place] [address] Phone: [phone] Fax: [phone]     Social Drivers of Health (SDOH) Social History: SDOH Screenings   Food Insecurity: Food Insecurity Present (07/30/2024)  Housing: Low Risk  (07/30/2024)  Transportation Needs: No Transportation Needs (07/30/2024)  Utilities: Not At Risk (07/30/2024)  Alcohol Screen: Low Risk  (06/08/2023)  Depression (PHQ2-9): High Risk (07/21/2024)  Financial Resource Strain: High Risk (10/30/2023)  Physical Activity: Unknown (10/30/2023)  Social Connections: Patient Declined (01/23/2024)  Recent Concern: Social Connections -  Socially Isolated (01/23/2024)  Stress: Stress Concern Present (07/17/2024)  Tobacco Use: High Risk (09/05/2024)  Health Literacy: Inadequate Health Literacy (06/08/2023)   SDOH Interventions:     Readmission Risk Interventions    01/25/2024    2:16 PM  Readmission Risk Prevention Plan  Transportation Screening Complete  PCP or Specialist Appt within 5-7 Days Complete  Home Care Screening Complete  Medication Review (RN CM) Referral to Pharmacy

## 2024-09-05 NOTE — ED Triage Notes (Signed)
 Pt reports increased weakness and unable to take care of himself. Pt reports he was d/c from rehab yesterday.

## 2024-09-05 NOTE — Transitions of Care (Post Inpatient/ED Visit) (Signed)
   09/05/2024  Name: [name] [name] MRN: [id number] DOB: 1971-09-06  Today's TOC FU Call Status: Today's TOC FU Call Status:: Unsuccessful Call (1st Attempt) Unsuccessful Call (1st Attempt) Date: 09/05/24  Attempted to reach the patient regarding the most recent Inpatient/ED visit.  Follow Up Plan: Additional outreach attempts will be made to reach the patient to complete the Transitions of Care (Post Inpatient/ED visit) call.   Signature Julian Lemmings, LPN [facility] Nurse Health Advisor Direct Dial  [phone]

## 2024-09-05 NOTE — ED Provider Notes (Signed)
 [place] EMERGENCY DEPARTMENT AT [facility] Provider Note   CSN: [id number] Arrival date & time: 09/05/24  1157     Patient presents with: Weakness   [name] [name] is a 53 y.o. male.    Weakness Patient presents for generalized weakness.  Medical history includes DM, ESRD, depression, right BKA, anemia, HTN.  He was admitted a month ago for abscess of right knee.  He was treated with broad-spectrum antibiotics.  He underwent debridement on 9/12.  He was advised to continue pressure offloading and did not wear his prosthesis for 2 weeks.  He was discharged to SNF on 9/17.  Antibiotics were continued until 9/19.  He was discharged from SNF to home yesterday.  He has his mother and sister there.  He has an ongoing wound on his right knee area.  He was seen by Ortho care yesterday.  They recommended continued offloading and wet-to-dry dressings.  Patient undergoes dialysis on Tuesday, Thursday, Saturday.  He did go to his session today.  He feels that he is too weak to care for himself at home and his mother and sister cannot provide adequate assistance.     Prior to Admission medications   Medication Sig Start Date End Date Taking? Authorizing Provider  Accu-Chek Softclix Lancets lancets Use to check blood sugar twice per day. E11.9 03/15/24   Christia Budds, MD  acetaminophen  (TYLENOL ) 500 MG tablet Take 2 tablets (1,000 mg total) by mouth every 6 (six) hours. 08/09/24   Janna Ferrier, DO  albuterol  (VENTOLIN  HFA) 108 (90 Base) MCG/ACT inhaler Inhale 2 puffs into the lungs every 6 (six) hours as needed for wheezing or shortness of breath. 06/13/24   Hindel, Leah, MD  atorvastatin  (LIPITOR) 40 MG tablet Take 1 tablet (40 mg total) by mouth daily. 07/13/24   Adele Song, MD  Blood Glucose Monitoring Suppl (ACCU-CHEK GUIDE ME) w/Device KIT See admin instructions. 02/23/23   [provider]  calcitRIOL  (ROCALTROL ) 0.25 MCG capsule Take 1 capsule (0.25 mcg total) by  mouth Every Tuesday,Thursday,and Saturday with dialysis. 10/26/23   Baloch, Mahnoor, MD  Calcium  Acetate 667 MG TABS Take 1,334 mg by mouth 3 (three) times daily. Patient taking differently: Take 1,334 mg by mouth 2 (two) times daily.    [provider]  carvedilol  (COREG ) 12.5 MG tablet Take 1 tablet (12.5 mg total) by mouth 2 (two) times daily with a meal. 07/07/24   Hindel, Leah, MD  diltiazem  (CARDIZEM  CD) 300 MG 24 hr capsule Take 1 capsule (300 mg total) by mouth daily. 07/01/24   Adele Song, MD  FLUoxetine  (PROZAC ) 40 MG capsule Take 1 capsule (40 mg total) by mouth daily. 06/28/24   Nygaard, Joseph, MD  fluticasone  (FLONASE ) 50 MCG/ACT nasal spray Place 2 sprays into both nostrils daily. Patient taking differently: Place 2 sprays into both nostrils daily as needed for allergies. 05/11/24   Christia Budds, MD  Fluticasone -Umeclidin-Vilant (TRELEGY ELLIPTA ) 100-62.5-25 MCG/ACT AEPB INHALE 1 PUFF INTO LUNGS DAILY 05/15/24   Christia Budds, MD  furosemide  (LASIX ) 40 MG tablet Take 40 mg by mouth 2 (two) times daily.    [provider]  gabapentin  (NEURONTIN ) 300 MG capsule TAKE 1 CAPSULE(300 MG) BY MOUTH DAILY 07/31/24   Hindel, Leah, MD  glucose blood (AGAMATRIX PRESTO TEST) test strip Check blood sugars twice daily before meals 02/02/18   Adella Norris, MD  glucose blood test strip 1 each by Other route as needed for other. Use as instructed    [provider]  hydrALAZINE  (APRESOLINE ) 100 MG tablet Take 2 tablets (200 mg total) by mouth 2 (two) times daily. 07/14/24   Lennie Raguel MATSU, DO  insulin  glargine (LANTUS ) 100 UNIT/ML injection Inject 0.22 mLs (22 Units total) into the skin daily. 08/09/24   Gomes, Adriana, DO  insulin  lispro (HUMALOG ) 100 UNIT/ML injection 0-6 Units, Subcutaneous, 3 times daily with meals, First dose on Sun 10/17/23 at 0800 Correction coverage: Very Sensitive (ESRD/Dialysis) CBG < 70: Implement Hypoglycemia Standing Orders and refer to  Hypoglycemia Standing Orders sidebar report CBG 70 - 120: 0 units CBG 121 - 150: 0 units CBG 151 - 200: 1 unit CBG 201-250: 2 units CBG 251-300: 3 units CBG 301-350: 4 units CBG 351-400: 5 units CBG > 400: Give 6 units and call MD 10/26/23   Lonnie Mahnoor, MD  loratadine  (CLARITIN ) 10 MG tablet Take 1 tablet (10 mg total) by mouth daily. 05/24/24 05/24/25  Isadora Hose, MD  methocarbamol  (ROBAXIN ) 750 MG tablet Take 1 tablet (750 mg total) by mouth every 8 (eight) hours as needed for muscle spasms. 07/14/24   Lennie Raguel MATSU, DO  ondansetron  (ZOFRAN -ODT) 4 MG disintegrating tablet Take 1 tablet (4 mg total) by mouth every 8 (eight) hours as needed for nausea or vomiting. 12/12/23   Christia Budds, MD  Oxycodone  HCl 10 MG TABS Take 1 tablet (10 mg total) by mouth every 4 (four) hours as needed. 08/09/24   Delores Suzann HERO, MD  pantoprazole  (PROTONIX ) 40 MG tablet Take 1 tablet (40 mg total) by mouth daily. 08/10/24   Hindel, Rea, MD  polyethylene glycol (MIRALAX  / GLYCOLAX ) 17 g packet Take 17 g by mouth daily. 08/09/24   Gomes, Adriana, DO  senna (SENOKOT) 8.6 MG TABS tablet Take 1 tablet (8.6 mg total) by mouth daily. 08/09/24   Janna Ferrier, DO  sodium zirconium cyclosilicate  (LOKELMA ) 10 g PACK packet Take 10 g by mouth daily. 08/09/24   Gomes, Adriana, DO  tamsulosin  (FLOMAX ) 0.4 MG CAPS capsule Take 1 capsule (0.4 mg total) by mouth daily. 07/27/24   Adele Rea, MD    Allergies: Iron  and Tape    Review of Systems  Skin:  Positive for wound.  Neurological:  Positive for weakness (Generalized).  All other systems reviewed and are negative.   Updated Vital Signs BP (!) 184/111 (BP Location: Right Arm)   Pulse 86   Temp 98.6 F (37 C)   Resp 16   Ht 6' 5 (1.956 m)   Wt 100 kg   SpO2 96%   BMI 26.14 kg/m   Physical Exam Vitals and nursing note reviewed.  Constitutional:      General: He is not in acute distress.    Appearance: Normal appearance. He is well-developed. He is  not ill-appearing, toxic-appearing or diaphoretic.  HENT:     Head: Normocephalic and atraumatic.     Right Ear: External ear normal.     Left Ear: External ear normal.     Nose: Nose normal.     Mouth/Throat:     Mouth: Mucous membranes are moist.  Eyes:     Extraocular Movements: Extraocular movements intact.     Conjunctiva/sclera: Conjunctivae normal.  Cardiovascular:     Rate and Rhythm: Normal rate and regular rhythm.     Heart sounds: No murmur heard. Pulmonary:     Effort: Pulmonary effort is normal. No respiratory distress.     Breath sounds: Normal breath sounds. No wheezing or rales.  Abdominal:  General: There is no distension.     Palpations: Abdomen is soft.     Tenderness: There is no abdominal tenderness.  Musculoskeletal:        General: No swelling.     Cervical back: Normal range of motion and neck supple.     Comments: Right BKA.  Dressing in place.  Skin:    General: Skin is warm and dry.     Coloration: Skin is not jaundiced or pale.     Comments: Dry, flaky skin on extremities.  Neurological:     General: No focal deficit present.     Mental Status: He is alert and oriented to person, place, and time.  Psychiatric:        Mood and Affect: Affect is flat.        Speech: Speech normal.        Behavior: Behavior is slowed and withdrawn. Behavior is cooperative.     (all labs ordered are listed, but only abnormal results are displayed) Labs Reviewed  COMPREHENSIVE METABOLIC PANEL WITH GFR - Abnormal; Notable for the following components:      Result Value   Sodium 131 (*)    Chloride 94 (*)    Glucose, Bld 105 (*)    Creatinine, Ser 4.10 (*)    Calcium  8.8 (*)    Albumin  3.0 (*)    GFR, Estimated 17 (*)    All other components within normal limits  CBC - Abnormal; Notable for the following components:   RBC 4.01 (*)    Hemoglobin 9.0 (*)    HCT 31.0 (*)    MCV 77.3 (*)    MCH 22.4 (*)    MCHC 29.0 (*)    RDW 25.5 (*)    All other  components within normal limits  URINALYSIS, ROUTINE W REFLEX MICROSCOPIC  CBG MONITORING, ED    EKG: None  Radiology: No results found.   Procedures   Medications Ordered in the ED - No data to display                                  Medical Decision Making Amount and/or Complexity of Data Reviewed Labs: ordered.   Patient presenting for generalized weakness and feeling that he is unable to care for himself at home.  He returned home from skilled nursing facility yesterday.  He was in facility for rehab after a recent hospitalization.  At facility, he has been able to pivot on his left leg.  He has continued to offload weight from his right BKA due to ongoing wound.  Since his return home, he has not inability to care for himself.  He was able to make it to dialysis today and did get a full session.  His lab work today shows baseline anemia, no leukocytosis, elevated creatinine consistent with ESRD with normal electrolytes.  Patient's vital signs are notable for hypertension.  Home medications were ordered.  Patient to remain in the ED tonight for PT evaluation and social work consult.  These were ordered.     Final diagnoses:  None    ED Discharge Orders     None          Melvenia Motto, MD 09/05/24 2203

## 2024-09-06 ENCOUNTER — Ambulatory Visit: Attending: Family Medicine

## 2024-09-06 ENCOUNTER — Telehealth: Payer: Self-pay | Admitting: Orthopedic Surgery

## 2024-09-06 LAB — CBG MONITORING, ED
Glucose-Capillary: 121 mg/dL — ABNORMAL HIGH (ref 70–99)
Glucose-Capillary: 172 mg/dL — ABNORMAL HIGH (ref 70–99)
Glucose-Capillary: 228 mg/dL — ABNORMAL HIGH (ref 70–99)
Glucose-Capillary: 238 mg/dL — ABNORMAL HIGH (ref 70–99)

## 2024-09-06 MED ORDER — ACETAMINOPHEN 500 MG PO TABS
ORAL_TABLET | ORAL | Status: AC
  Filled 2024-09-06: qty 1

## 2024-09-06 NOTE — ED Notes (Signed)
 Pt sts no wound care had been provided. Dressing not dated.   Wound care provided to RLE BKA wound per orders. Vashe wound cleanser used and allowed to air dry. Addition mosited gauze placed with ABD pad and kerlix. Ace bandage placed over with pts personal BKA cover. Dressing time and dated.

## 2024-09-06 NOTE — ED Provider Notes (Signed)
 Emergency Medicine Observation Re-evaluation Note  [name] [name] is a 53 y.o. male, seen on rounds today.  Pt initially presented to the ED for complaints of Weakness Currently, the patient is awake and alert.  He is a dialysis patient (Tu, Th, Sat) who has a hx of BKA with debridement on 9/12.  He was d/c to SNF on 9/17 and went home on 10/13.  He feels like he's too weak to be at home. TOC and PT consulted.    Physical Exam  BP (!) 176/99 (BP Location: Right Arm)   Pulse 80   Temp 97.9 F (36.6 C)   Resp 16   Ht 6' 5 (1.956 m)   Wt 100 kg   SpO2 97%   BMI 26.14 kg/m  Physical Exam General: awake and alert Cardiac: rr Lungs: clear Psych: calm  ED Course / MDM  EKG:EKG Interpretation Date/Time:  Tuesday September 05 2024 13:18:18 EDT Ventricular Rate:  82 PR Interval:  202 QRS Duration:  94 QT Interval:  402 QTC Calculation: 469 R Axis:   70  Text Interpretation: Normal sinus rhythm Confirmed by Melvenia Motto [phone]) on 09/05/2024 9:37:38 PM  I have reviewed the labs performed to date as well as medications administered while in observation.  Recent changes in the last 24 hours include ED eval.  Plan  Current plan is for toc placement.  Pt will need dialysis tomorrow if he's still here.    Dean Clarity, MD 09/06/24 [phone]

## 2024-09-06 NOTE — Consult Note (Addendum)
 WOC Nurse Consult Note:   history of right BKA underwent Excisional debridement abscess popliteal fossa right knee with excision skin soft tissue muscle fascia and pseudocyst 08/04/2024;  has been followed at Dr. Crist office for ongoing management; last seen 09/04/2024 with orders for Vashe WTD  Reason for Consult: R stump wound  Wound type: full thickness post debridement as above  Pressure Injury POA: NA  Measurement: see nursing flowsheet; per ortho note 10/13 2 cm x 3 cm x 0.5 cm  Wound bed: 100% red beefy (see photo 09/04/2024)  Drainage (amount, consistency, odor) see nursing flowsheet  Periwound:  Dressing procedure/placement/frequency: Cleanse R stump/knee wound with Vashe wound cleanser Soila [phone]) do not rinse and allow to air dry. Apply Vashe moistened gauze to wound bed daily, cover with ABD pad and secure with Kerlix roll gauze.  Apply Ace bandage over kerlix for light compression.    POC discussed with bedside nurse. WOC team will not follow. Re-consult if further needs arise.  Patient should continue management by Dr. Harden ongoing as outpatient.   Thank you,    Powell Bar MSN, RN-BC, [employer]

## 2024-09-06 NOTE — NC FL2 (Signed)
 [place]  MEDICAID FL2 LEVEL OF CARE FORM     IDENTIFICATION  Patient Name: [name] [name] Birthdate: [birth year] Sex: male Admission Date (Current Location): 09/05/2024  [facility] and [place] Number:  Producer, television/film/video and Address:  [facility], [address], [place], [place] [address]      Provider Number: [id number]  Attending Physician Name and Address:  No att. providers found  Relative Name and Phone Number:       Current Level of Care: Hospital Recommended Level of Care: Skilled Nursing Facility Prior Approval Number:    Date Approved/Denied:   PASRR Number: [id number]  Discharge Plan: SNF    Current Diagnoses: Patient Active Problem List   Diagnosis Date Noted   Mechanical complication of dialysis catheter 07/29/2024   Abscess of right lower extremity 07/29/2024   Chronic health problem 07/29/2024   ESRD on hemodialysis (HCC) 07/29/2024   Anemia of chronic disease 07/29/2024   Nausea and vomiting 01/26/2024   A-V fistula 01/25/2024   Depression 01/24/2024   Nonimmune to hepatitis B virus 10/22/2023   Urinary retention 10/14/2023   Closed intertrochanteric fracture of right hip (HCC) 10/13/2023   Contracture, 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   Pleural effusion 02/16/2023   Allergic rhinitis 11/07/2022   Tobacco dependence 09/11/2022   ESRD (end stage renal disease) on dialysis (HCC) 09/11/2022   GERD with esophagitis 09/02/2021   Acute kidney injury superimposed on CKD 04/10/2021   Achilles tendon contracture, left 03/03/2018   Non-pressure chronic ulcer of other part of left foot limited to breakdown of skin (HCC) 02/02/2018   History of right below knee amputation (HCC) 12/22/2017   Acute posthemorrhagic anemia    Falls 11/24/2017   Severe hypertension 11/24/2017   Anemia 11/24/2017   Uncontrolled type 2 diabetes mellitus with hyperglycemia, with long-term current use of insulin  (HCC) 10/11/2017   ARF (acute renal failure)  10/11/2017    Orientation RESPIRATION BLADDER Height & Weight     Self, Time, Situation, Place  Normal Continent Weight: 220 lb 7.4 oz (100 kg) Height:  6' 5 (195.6 cm)  BEHAVIORAL SYMPTOMS/MOOD NEUROLOGICAL BOWEL NUTRITION STATUS      Continent Diet (see d/c summary)  AMBULATORY STATUS COMMUNICATION OF NEEDS Skin   Extensive Assist Verbally Normal                       Personal Care Assistance Level of Assistance  Bathing, Feeding, Dressing Bathing Assistance: Maximum assistance Feeding assistance: Independent Dressing Assistance: Limited assistance     Functional Limitations Info  Sight, Hearing, Speech Sight Info: Adequate Hearing Info: Adequate Speech Info: Adequate    SPECIAL CARE FACTORS FREQUENCY  PT (By licensed PT), OT (By licensed OT)     PT Frequency: 5x/week OT Frequency: 5x/week            Contractures Contractures Info: Not present    Additional Factors Info  Code Status, Allergies Code Status Info: full code Allergies Info: Iron , tape           Current Medications (09/06/2024):  This is the current hospital active medication list Current Facility-Administered Medications  Medication Dose Route Frequency Provider Last Rate Last Admin   acetaminophen  (TYLENOL ) tablet 1,000 mg  1,000 mg Oral Q6H Melvenia Motto, MD   1,000 mg at 09/06/24 1015   albuterol  (VENTOLIN  HFA) 108 (90 Base) MCG/ACT inhaler 2 puff  2 puff Inhalation Q6H PRN Melvenia Motto, MD       atorvastatin  (  LIPITOR) tablet 40 mg  40 mg Oral Daily Melvenia Motto, MD   40 mg at 09/06/24 1013   carvedilol  (COREG ) tablet 12.5 mg  12.5 mg Oral BID WC Melvenia Motto, MD   12.5 mg at 09/06/24 0844   diltiazem  (CARDIZEM  CD) 24 hr capsule 300 mg  300 mg Oral Daily Melvenia Motto, MD   300 mg at 09/06/24 1016   FLUoxetine  (PROZAC ) capsule 40 mg  40 mg Oral Daily Melvenia Motto, MD   40 mg at 09/06/24 1014   gabapentin  (NEURONTIN ) capsule 300 mg  300 mg Oral BID Melvenia Motto, MD   300 mg at 09/06/24 1013    hydrALAZINE  (APRESOLINE ) tablet 100 mg  100 mg Oral BID Melvenia Motto, MD   100 mg at 09/06/24 1013   insulin  aspart (novoLOG ) injection 0-5 Units  0-5 Units Subcutaneous QHS Melvenia Motto, MD       insulin  aspart (novoLOG ) injection 0-9 Units  0-9 Units Subcutaneous TID WC Melvenia Motto, MD   1 Units at 09/06/24 0845   insulin  aspart (novoLOG ) injection 2 Units  2 Units Subcutaneous TID WC Melvenia Motto, MD   2 Units at 09/06/24 0845   methocarbamol  (ROBAXIN ) tablet 750 mg  750 mg Oral Q8H PRN Melvenia Motto, MD   750 mg at 09/06/24 1014   oxyCODONE  (Oxy IR/ROXICODONE ) immediate release tablet 10 mg  10 mg Oral Q8H PRN Melvenia Motto, MD       pantoprazole  (PROTONIX ) EC tablet 40 mg  40 mg Oral Daily Melvenia Motto, MD   40 mg at 09/06/24 1015   polyethylene glycol (MIRALAX  / GLYCOLAX ) packet 17 g  17 g Oral Daily Melvenia Motto, MD   17 g at 09/06/24 1013   Current Outpatient Medications  Medication Sig Dispense Refill   Accu-Chek Softclix Lancets lancets Use to check blood sugar twice per day. E11.9 100 each 12   acetaminophen  (TYLENOL ) 500 MG tablet Take 2 tablets (1,000 mg total) by mouth every 6 (six) hours.     albuterol  (VENTOLIN  HFA) 108 (90 Base) MCG/ACT inhaler Inhale 2 puffs into the lungs every 6 (six) hours as needed for wheezing or shortness of breath. 8 g 2   atorvastatin  (LIPITOR) 40 MG tablet Take 1 tablet (40 mg total) by mouth daily. 90 tablet 3   Blood Glucose Monitoring Suppl (ACCU-CHEK GUIDE ME) w/Device KIT See admin instructions.     calcitRIOL  (ROCALTROL ) 0.25 MCG capsule Take 1 capsule (0.25 mcg total) by mouth Every Tuesday,Thursday,and Saturday with dialysis. 30 capsule 0   Calcium  Acetate 667 MG TABS Take 1,334 mg by mouth 3 (three) times daily. (Patient taking differently: Take 1,334 mg by mouth 2 (two) times daily.)     carvedilol  (COREG ) 12.5 MG tablet Take 1 tablet (12.5 mg total) by mouth 2 (two) times daily with a meal. 90 tablet 0   diltiazem  (CARDIZEM  CD) 300 MG 24 hr capsule  Take 1 capsule (300 mg total) by mouth daily. 90 capsule 0   FLUoxetine  (PROZAC ) 40 MG capsule Take 1 capsule (40 mg total) by mouth daily. 90 capsule 3   fluticasone  (FLONASE ) 50 MCG/ACT nasal spray Place 2 sprays into both nostrils daily. (Patient taking differently: Place 2 sprays into both nostrils daily as needed for allergies.) 16 g 6   Fluticasone -Umeclidin-Vilant (TRELEGY ELLIPTA ) 100-62.5-25 MCG/ACT AEPB INHALE 1 PUFF INTO LUNGS DAILY 60 each 3   furosemide  (LASIX ) 40 MG tablet Take 40 mg by mouth 2 (two) times daily.  gabapentin  (NEURONTIN ) 300 MG capsule TAKE 1 CAPSULE(300 MG) BY MOUTH DAILY 30 capsule 0   glucose blood (AGAMATRIX PRESTO TEST) test strip Check blood sugars twice daily before meals 100 each 12   glucose blood test strip 1 each by Other route as needed for other. Use as instructed     hydrALAZINE  (APRESOLINE ) 100 MG tablet Take 2 tablets (200 mg total) by mouth 2 (two) times daily. 120 tablet 1   insulin  glargine (LANTUS ) 100 UNIT/ML injection Inject 0.22 mLs (22 Units total) into the skin daily.     insulin  lispro (HUMALOG ) 100 UNIT/ML injection 0-6 Units, Subcutaneous, 3 times daily with meals, First dose on Sun 10/17/23 at 0800 Correction coverage: Very Sensitive (ESRD/Dialysis) CBG < 70: Implement Hypoglycemia Standing Orders and refer to Hypoglycemia Standing Orders sidebar report CBG 70 - 120: 0 units CBG 121 - 150: 0 units CBG 151 - 200: 1 unit CBG 201-250: 2 units CBG 251-300: 3 units CBG 301-350: 4 units CBG 351-400: 5 units CBG > 400: Give 6 units and call MD     loratadine  (CLARITIN ) 10 MG tablet Take 1 tablet (10 mg total) by mouth daily. 30 tablet 11   methocarbamol  (ROBAXIN ) 750 MG tablet Take 1 tablet (750 mg total) by mouth every 8 (eight) hours as needed for muscle spasms. 30 tablet 0   ondansetron  (ZOFRAN -ODT) 4 MG disintegrating tablet Take 1 tablet (4 mg total) by mouth every 8 (eight) hours as needed for nausea or vomiting. 20 tablet 0    Oxycodone  HCl 10 MG TABS Take 1 tablet (10 mg total) by mouth every 4 (four) hours as needed. 20 tablet 0   pantoprazole  (PROTONIX ) 40 MG tablet Take 1 tablet (40 mg total) by mouth daily. 90 tablet 3   polyethylene glycol (MIRALAX  / GLYCOLAX ) 17 g packet Take 17 g by mouth daily.     senna (SENOKOT) 8.6 MG TABS tablet Take 1 tablet (8.6 mg total) by mouth daily.     sodium zirconium cyclosilicate  (LOKELMA ) 10 g PACK packet Take 10 g by mouth daily.     tamsulosin  (FLOMAX ) 0.4 MG CAPS capsule Take 1 capsule (0.4 mg total) by mouth daily. 30 capsule 3     Discharge Medications: Please see discharge summary for a list of discharge medications.  Relevant Imaging Results:  Relevant Lab Results:   Additional Information SSN: [id number],  HD: TTS  Kye Hedden SHAUNNA Cumming, LCSW

## 2024-09-06 NOTE — Transitions of Care (Post Inpatient/ED Visit) (Signed)
   09/06/2024  Name: [name] [name] MRN: [id number] DOB: June 15, 1971  Today's TOC FU Call Status: Today's TOC FU Call Status:: Unsuccessful Call (2nd Attempt) Unsuccessful Call (1st Attempt) Date: 09/05/24 Unsuccessful Call (2nd Attempt) Date: 09/06/24  Attempted to reach the patient regarding the most recent Inpatient/ED visit.  Follow Up Plan: Additional outreach attempts will be made to reach the patient to complete the Transitions of Care (Post Inpatient/ED visit) call.   Signature Julian Lemmings, LPN [facility] Nurse Health Advisor Direct Dial  [phone]

## 2024-09-06 NOTE — Progress Notes (Addendum)
 [facility] may be able to offer a bed pending bed availability. They would require initial medicare auth if they can take him. They will review referral and bed availability.   Pt has no other SNF bed offers at this time.   1540: [facility] does not have any bed availability.  [facility] listed as considering. CSW contacted [facility] to inquire about potential bed offer; waiting on response.

## 2024-09-06 NOTE — Progress Notes (Addendum)
 Met with pt bedside to inquire about disposition. PT recommending SNF. Pt states he was discharged from [facility] Monday due to insurance no longer authorizing rehab days. Pt lives at home with mom and sister. He states he is unable to manage and that his wheelchair doesn't fit in the home. He is unable to walk and states he needs a new prosthetic. CSW discussed SNF for short term rehab under medicare vs LTC with medicaid. CSW explained medicare may not approve auth  or approve it for very long. Pt may need to go to SNF for LTC eventually requiring medicaid to be switched to LTC medicaid and pt's disability check going to facility. Pt is hesitant about this though would like to move forward. He states I can't go home, I tried that. Fl2 completed and bed requests sent in hub.

## 2024-09-06 NOTE — Evaluation (Signed)
 Physical Therapy Evaluation Patient Details Name: [name] [name] MRN: [id number] DOB: January 11, 1971 Today's Date: 09/06/2024  History of Present Illness  [name] [name] is a 53 y.o. male who presented to [facility] ED 09/05/24 for generalized weakness. 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   Clinical Impression  Pt admitted with above diagnosis. Pt was recently d/c'd home from [facility] after receiving therapy services. He requires assistance with functional mobility and ADLs/IADLs. Pt was mobilizing in a manual w/c at rehab, which he reports cannot fit inside his home. Pt currently with functional limitations due to the deficits listed below (see PT Problem List). He performed bed mobility with supervision and sit<>stand with minA. Pt is limited by pain, impaired skin integrity, inability to wear R BKA prosthesis, decrease RLE AROM/strength, impaired balance, and decreased activity tolerance. Pt will benefit from acute skilled PT to increase his independence and safety with mobility to allow discharge. Recommend continued inpatient follow up therapy, <3 hours/day.     If plan is discharge home, recommend the following: A lot of help with walking and/or transfers;A lot of help with bathing/dressing/bathroom;Assistance with cooking/housework;Assist for transportation;Help with stairs or ramp for entrance   Can travel by private vehicle   No    Equipment Recommendations None recommended by PT  Recommendations for Other Services  OT consult    Functional Status Assessment Patient has had a recent decline in their functional status and demonstrates the ability to make significant improvements in function in a reasonable and predictable amount of time.     Precautions / Restrictions Precautions Precautions: Fall Recall of  Precautions/Restrictions: Impaired Precaution/Restrictions Comments: Continued offloading from R BKA prosthesis until R posterior knee wound heals Restrictions Weight Bearing Restrictions Per Provider Order: No      Mobility  Bed Mobility Overal bed mobility: Needs Assistance Bed Mobility: Supine to Sit, Sit to Supine     Supine to sit: Supervision, HOB elevated, Used rails Sit to supine: Supervision, HOB elevated   General bed mobility comments: Pt sat up on R/L side of bed with HOB elevated to 45deg. Use of bed rails. Pt managed BLE and trunk without assist. Sup for safety.    Transfers Overall transfer level: Needs assistance Equipment used: 1 person hand held assist Transfers: Sit to/from Stand Sit to Stand: From elevated surface, Min assist           General transfer comment: Pt stood from raised bed height. PT supported RUE and pt held onto bed rail with LUE. He maintained static stance for <30sec, quickly fatiguing and demonstrating postural sway and unsteadiness. Poor eccentric control, plopping down.    Ambulation/Gait                  Stairs            Wheelchair Mobility     Tilt Bed    Modified Rankin (Stroke Patients Only)       Balance Overall balance assessment: Needs assistance Sitting-balance support: No upper extremity supported, Feet supported Sitting balance-Leahy Scale: Fair     Standing balance support: Bilateral upper extremity supported, During functional activity Standing balance-Leahy Scale: Poor Standing balance comment: Pt dependent on external support of therapist and rail  Pertinent Vitals/Pain Pain Assessment Pain Assessment: 0-10 Pain Score: 9  Pain Location: R Knee Pain Descriptors / Indicators: Burning, Grimacing Pain Intervention(s): Monitored during session, Limited activity within patient's tolerance, Repositioned    Home Living Family/patient expects to be  discharged to:: Skilled nursing facility Living Arrangements: Parent;Other relatives (Mother and Sister) Available Help at Discharge: Family;Available 24 hours/day (Pt reports they cannot physical assist him) Type of Home: Mobile home Home Access: Stairs to enter Entrance Stairs-Rails: Left;Right;Can reach both Entrance Stairs-Number of Steps: 3   Home Layout: One level Home Equipment: Wheelchair - Forensic psychologist (2 wheels);Grab bars - tub/shower;Hand held shower head;BSC/3in1;Rollator (4 wheels) Additional Comments: Pt was at [facility] from 9/17-10/13. He was recently d/c'd home and reports he doesn't feel like he can care for himself and doesn't have adequate support. Pt states a w/c cannot fit within his home and doesn't feel safe mobilizing without prosthesis.    Prior Function Prior Level of Function : Needs assist;History of Falls (last six months)       Physical Assist : Mobility (physical);ADLs (physical) Mobility (physical): Transfers;Gait ADLs (physical): Bathing;Dressing;Toileting;IADLs Mobility Comments: Pt reports 1+ assist from facility staff for OOB mobility, transferring by stand pivots using RW into manual w/c which he can propel himself. Previously, pt was modI using rollator and wearing R BKA prosthesis. ADLs Comments: Pt reports 1+ assist from facility staff for basic self care. Previously, pt was modI with ADLs/IADLs wearing R BKA prosthesis and could drive.     Extremity/Trunk Assessment   Upper Extremity Assessment Upper Extremity Assessment: Defer to OT evaluation    Lower Extremity Assessment Lower Extremity Assessment: RLE deficits/detail;LLE deficits/detail RLE Deficits / Details: Hip AROM WFL. Decreased knee AROM d/t pain. Grossly 3/5 strength, did not resist d/t pt's pain at rest. RLE: Unable to fully assess due to pain RLE Sensation: decreased proprioception RLE Coordination: decreased gross motor LLE Deficits / Details: AROM WFL.  Grossly 4/5 strength. LLE Sensation: WNL LLE Coordination: WNL    Cervical / Trunk Assessment Cervical / Trunk Assessment: Normal  Communication   Communication Communication: No apparent difficulties    Cognition Arousal: Alert Behavior During Therapy: WFL for tasks assessed/performed   PT - Cognitive impairments: No apparent impairments                       PT - Cognition Comments: Pt A,Ox4 Following commands: Intact       Cueing Cueing Techniques: Verbal cues     General Comments General comments (skin integrity, edema, etc.): VSS on RA.    Exercises     Assessment/Plan    PT Assessment Patient needs continued PT services  PT Problem List Decreased strength;Decreased range of motion;Decreased activity tolerance;Decreased balance;Decreased mobility;Decreased knowledge of use of DME;Decreased safety awareness;Decreased knowledge of precautions;Pain;Decreased skin integrity       PT Treatment Interventions DME instruction;Gait training;Functional mobility training;Therapeutic activities;Therapeutic exercise;Balance training;Patient/family education;Wheelchair mobility training    PT Goals (Current goals can be found in the Care Plan section)  Acute Rehab PT Goals Patient Stated Goal: Regain independence and be able to use prosthesis again PT Goal Formulation: With patient Time For Goal Achievement: 09/20/24 Potential to Achieve Goals: Fair    Frequency Min 2X/week     Co-evaluation               AM-PAC PT 6 Clicks Mobility  Outcome Measure Help needed turning from your back to your side while in a flat bed without  using bedrails?: A Little Help needed moving from lying on your back to sitting on the side of a flat bed without using bedrails?: A Little Help needed moving to and from a bed to a chair (including a wheelchair)?: A Lot Help needed standing up from a chair using your arms (e.g., wheelchair or bedside chair)?: A Little Help needed  to walk in hospital room?: A Lot Help needed climbing 3-5 steps with a railing? : Total 6 Click Score: 14    End of Session Equipment Utilized During Treatment: Gait belt Activity Tolerance: Patient tolerated treatment well Patient left: in bed;with call bell/phone within reach;with bed alarm set Nurse Communication: Mobility status PT Visit Diagnosis: Difficulty in walking, not elsewhere classified (R26.2);Other abnormalities of gait and mobility (R26.89);Unsteadiness on feet (R26.81);Pain Pain - Right/Left: Right Pain - part of body: Knee    Time: 0741-0803 PT Time Calculation (min) (ACUTE ONLY): 22 min   Charges:   PT Evaluation $PT Eval Moderate Complexity: 1 Mod   PT General Charges $$ ACUTE PT VISIT: 1 Visit         Randall SAUNDERS, PT, DPT Acute Rehabilitation Services Office: [phone] Secure Chat Preferred  Delon CHRISTELLA Callander 09/06/2024, 9:32 AM

## 2024-09-06 NOTE — Telephone Encounter (Signed)
 Patient called and ask if he could get an prescription for prostethics for Right leg. He stated that it can not fit because it is swollen. He is in he hospital for it right now. RA#[phone]

## 2024-09-06 NOTE — ED Notes (Signed)
 Vashe wound cleanser ordered from material by secretary Alfonso.

## 2024-09-07 DIAGNOSIS — E118 Type 2 diabetes mellitus with unspecified complications: Secondary | ICD-10-CM | POA: Diagnosis not present

## 2024-09-07 DIAGNOSIS — Z794 Long term (current) use of insulin: Secondary | ICD-10-CM | POA: Diagnosis not present

## 2024-09-07 LAB — CBG MONITORING, ED
Glucose-Capillary: 235 mg/dL — ABNORMAL HIGH (ref 70–99)
Glucose-Capillary: 263 mg/dL — ABNORMAL HIGH (ref 70–99)
Glucose-Capillary: 272 mg/dL — ABNORMAL HIGH (ref 70–99)
Glucose-Capillary: 300 mg/dL — ABNORMAL HIGH (ref 70–99)
Glucose-Capillary: 108 mg/dL — ABNORMAL HIGH (ref 70–99)

## 2024-09-07 NOTE — ED Notes (Signed)
 Meal tray order see earlier procedural note.

## 2024-09-07 NOTE — ED Notes (Signed)
 Pt requested blood sugar check. CBG 235. Pt requested insulin . Was informed next dose due at 8 am with a repeat CBG near that time.

## 2024-09-07 NOTE — TOC Progression Note (Signed)
 Transition of Care [facility]) - Progression Note    Patient Details  Name: [name] [name] MRN: [id number] Date of Birth: 07-03-1971  Transition of [facility]) CM/SW Contact  Hartley KATHEE Robertson, LCSWA Phone Number: 09/07/2024, 4:14 PM  Clinical Narrative:     CSW received phone call from Tammy with [facility], she states they ran pt's Medicaid and it is no longer active, she states pt will need a new Medicaid app started. CSW reached out to Financial Counseling to request assistance, will continue to follow.                      Expected Discharge Plan and Services                                               Social Drivers of Health (SDOH) Interventions SDOH Screenings   Food Insecurity: Food Insecurity Present (07/30/2024)  Housing: Low Risk  (07/30/2024)  Transportation Needs: No Transportation Needs (07/30/2024)  Utilities: Not At Risk (07/30/2024)  Alcohol Screen: Low Risk  (06/08/2023)  Depression (PHQ2-9): High Risk (07/21/2024)  Financial Resource Strain: High Risk (10/30/2023)  Physical Activity: Unknown (10/30/2023)  Social Connections: Patient Declined (01/23/2024)  Recent Concern: Social Connections - Socially Isolated (01/23/2024)  Stress: Stress Concern Present (07/17/2024)  Tobacco Use: High Risk (09/05/2024)  Health Literacy: Inadequate Health Literacy (06/08/2023)    Readmission Risk Interventions    01/25/2024    2:16 PM  Readmission Risk Prevention Plan  Transportation Screening Complete  PCP or Specialist Appt within 5-7 Days Complete  Home Care Screening Complete  Medication Review (RN CM) Referral to Pharmacy

## 2024-09-07 NOTE — Telephone Encounter (Signed)
 This pt is currently in the hospital. Hje is s/p I&D right knee 08/04/2024. He was d/c from SNF and went to the ER the next day because he states that he was unable to take care of himself. Please see the message below. I do not know if you want to see him while he is in the hospital

## 2024-09-07 NOTE — Procedures (Signed)
 Meal tray order for pt after meal options given.

## 2024-09-07 NOTE — Progress Notes (Addendum)
 Spoke to [employer] with [facility] who reports they still do not have bed availability at [facility] or [facility].  Spoke to [place] at [facility] who reports they are considering, await DON review.  Spoke to pt and provided update.   1400: Per Leontine [facility] and [facility] are both reviewing. No other offers at this time.   1421: [facility] and [facility] have denied.  Reached out to Tammy with [facility] re bed availability at any of their facilities.  If a SNF secured outside of [place], will likely need to change HD centers, pt aware.   Julien Das, MSW, LCSW [phone] (coverage)

## 2024-09-07 NOTE — Progress Notes (Signed)
 Pt refused breakfast. When inquired pt stated last night I did not want my renal tray so  the man let me order off the menu so I had pineapple, potato's and rotisserie chicken. Education provided to pt. Dietary called to list diet options available to pt given current diet order. Pt unhappy with dietary option listed by dietary over phone thru RN. Pt insisted that he talk to dietary and dietary number [phone] given. After call to dietary and being informed that pt was still on a renal diet pt asked to speak to a provider. Provider Rogelia MD aware via conversation.

## 2024-09-07 NOTE — ED Notes (Signed)
 Attempted to offer pt breakfast options per new meal order however pt is talking on the phone at the moment and would not respond to staff. RN will attempt again later.

## 2024-09-07 NOTE — ED Provider Notes (Signed)
 Emergency Medicine Observation Re-evaluation Note  [name] [name] is a 53 y.o. male, seen on rounds today.  Pt initially presented to the ED for complaints of Weakness PMHx: DM, ESRD, depression, right BKA, anemia, HTN.  Patient recently was admitted 9/12 for right knee abscess, was discharged to SNF on 9/17, was discharged home from SNF on 10/14, however does not feel that he is able to care for himself at home Currently, the patient is resting quietly in bed.  Physical Exam  BP (!) 167/85 (BP Location: Right Arm)   Pulse 76   Temp 97.7 F (36.5 C) (Oral)   Resp 16   Ht 6' 5 (1.956 m)   Wt 100 kg   SpO2 96%   BMI 26.14 kg/m  Physical Exam General: NAD Lungs: Normal effort Psych: Currently calm  ED Course / MDM  EKG:EKG Interpretation Date/Time:  Tuesday September 05 2024 13:18:18 EDT Ventricular Rate:  82 PR Interval:  202 QRS Duration:  94 QT Interval:  402 QTC Calculation: 469 R Axis:   70  Text Interpretation: Normal sinus rhythm Confirmed by Melvenia Motto [phone]) on 09/05/2024 9:37:38 PM  I have reviewed the labs performed to date as well as medications administered while in observation.  Recent changes in the last 24 hours include no new interventions since initial evaluation. Patient complains that his renal/consistent carb diet is overly strict, states that they always give me a carb modified diet when I am up here.  Discussed that a nonrenal diet code worsen his kidney function and electrolyte imbalances, patient acknowledges risks and benefits, however still requests a consistent carb diet.  This was ordered.  Plan  Current plan is for [facility] evaluation for possible SNF placement.    [name] Jerilynn RAMAN, MD 09/07/24 [phone]

## 2024-09-08 DIAGNOSIS — N186 End stage renal disease: Secondary | ICD-10-CM | POA: Diagnosis not present

## 2024-09-08 DIAGNOSIS — I120 Hypertensive chronic kidney disease with stage 5 chronic kidney disease or end stage renal disease: Secondary | ICD-10-CM | POA: Diagnosis not present

## 2024-09-08 DIAGNOSIS — R531 Weakness: Secondary | ICD-10-CM | POA: Diagnosis not present

## 2024-09-08 DIAGNOSIS — Z992 Dependence on renal dialysis: Secondary | ICD-10-CM | POA: Diagnosis not present

## 2024-09-08 DIAGNOSIS — D631 Anemia in chronic kidney disease: Secondary | ICD-10-CM | POA: Diagnosis not present

## 2024-09-08 LAB — RENAL FUNCTION PANEL
Albumin: 2.6 g/dL — ABNORMAL LOW (ref 3.5–5.0)
Anion gap: 13 (ref 5–15)
BUN: 53 mg/dL — ABNORMAL HIGH (ref 6–20)
CO2: 22 mmol/L (ref 22–32)
Calcium: 7.2 mg/dL — ABNORMAL LOW (ref 8.9–10.3)
Chloride: 94 mmol/L — ABNORMAL LOW (ref 98–111)
Creatinine, Ser: 8.87 mg/dL — ABNORMAL HIGH (ref 0.61–1.24)
GFR, Estimated: 7 mL/min — ABNORMAL LOW (ref >60)
Glucose, Bld: 192 mg/dL — ABNORMAL HIGH (ref 70–99)
Phosphorus: 6.2 mg/dL — ABNORMAL HIGH (ref 2.5–4.6)
Potassium: 6.2 mmol/L — ABNORMAL HIGH (ref 3.5–5.1)
Sodium: 129 mmol/L — ABNORMAL LOW (ref 135–145)

## 2024-09-08 LAB — CBC
HCT: 28.5 % — ABNORMAL LOW (ref 39.0–52.0)
Hemoglobin: 8.3 g/dL — ABNORMAL LOW (ref 13.0–17.0)
MCH: 22.6 pg — ABNORMAL LOW (ref 26.0–34.0)
MCHC: 29.1 g/dL — ABNORMAL LOW (ref 30.0–36.0)
MCV: 77.7 fL — ABNORMAL LOW (ref 80.0–100.0)
Platelets: 119 K/uL — ABNORMAL LOW (ref 150–400)
RBC: 3.67 MIL/uL — ABNORMAL LOW (ref 4.22–5.81)
RDW: 25.6 % — ABNORMAL HIGH (ref 11.5–15.5)
WBC: 6.8 K/uL (ref 4.0–10.5)
nRBC: 0 % (ref 0.0–0.2)

## 2024-09-08 LAB — CBG MONITORING, ED
Glucose-Capillary: 157 mg/dL — ABNORMAL HIGH (ref 70–99)
Glucose-Capillary: 189 mg/dL — ABNORMAL HIGH (ref 70–99)
Glucose-Capillary: 100 mg/dL — ABNORMAL HIGH (ref 70–99)
Glucose-Capillary: 158 mg/dL — ABNORMAL HIGH (ref 70–99)

## 2024-09-08 MED ORDER — CHLORHEXIDINE GLUCONATE CLOTH 2 % EX PADS: 6.0000 | MEDICATED_PAD | Freq: Every day | CUTANEOUS | Status: DC

## 2024-09-08 MED ORDER — HEPARIN SODIUM (PORCINE) 1000 UNIT/ML IJ SOLN
INTRAMUSCULAR | Status: AC
  Filled 2024-09-08: qty 4

## 2024-09-08 MED ORDER — LIDOCAINE-PRILOCAINE 2.5-2.5 % EX CREA: 1.0000 | TOPICAL_CREAM | CUTANEOUS | Status: DC | PRN

## 2024-09-08 MED ORDER — HEPARIN SODIUM (PORCINE) 1000 UNIT/ML DIALYSIS
2000.0000 [IU] | Freq: Once | INTRAMUSCULAR | Status: AC
  Administered 2024-09-08: 2000 [IU] via INTRAVENOUS_CENTRAL
  Filled 2024-09-08: qty 2

## 2024-09-08 MED ORDER — ANTICOAGULANT SODIUM CITRATE 4% (200MG/5ML) IV SOLN: 5.0000 mL | Status: DC | PRN

## 2024-09-08 MED ORDER — LIDOCAINE HCL (PF) 1 % IJ SOLN: 5.0000 mL | INTRAMUSCULAR | Status: DC | PRN

## 2024-09-08 MED ORDER — HEPARIN SODIUM (PORCINE) 1000 UNIT/ML DIALYSIS
1000.0000 [IU] | INTRAMUSCULAR | Status: DC | PRN
  Administered 2024-09-08: 3800 [IU]

## 2024-09-08 MED ORDER — PENTAFLUOROPROP-TETRAFLUOROETH EX AERO: 1.0000 | INHALATION_SPRAY | CUTANEOUS | Status: DC | PRN

## 2024-09-08 MED ORDER — ALTEPLASE 2 MG IJ SOLR: 2.0000 mg | Freq: Once | INTRAMUSCULAR | Status: DC | PRN

## 2024-09-08 MED ORDER — HEPARIN SODIUM (PORCINE) 1000 UNIT/ML DIALYSIS: 2000.0000 [IU] | Freq: Once | INTRAMUSCULAR | Status: DC

## 2024-09-08 MED ORDER — GABAPENTIN 300 MG PO CAPS
300.0000 mg | ORAL_CAPSULE | Freq: Every day | ORAL | Status: DC
  Administered 2024-09-09 – 2024-09-10 (×2): 300 mg via ORAL
  Filled 2024-09-08 (×3): qty 1

## 2024-09-08 MED ORDER — DILTIAZEM HCL ER COATED BEADS 300 MG PO CP24
300.0000 mg | ORAL_CAPSULE | Freq: Every day | ORAL | Status: DC
  Administered 2024-09-08 – 2024-09-10 (×3): 300 mg via ORAL
  Filled 2024-09-08 (×5): qty 1

## 2024-09-08 MED ORDER — ALBUTEROL SULFATE (2.5 MG/3ML) 0.083% IN NEBU: 2.5000 mg | INHALATION_SOLUTION | Freq: Four times a day (QID) | RESPIRATORY_TRACT | Status: DC | PRN

## 2024-09-08 MED ORDER — HEPARIN SODIUM (PORCINE) 1000 UNIT/ML DIALYSIS: 1000.0000 [IU] | INTRAMUSCULAR | Status: DC | PRN

## 2024-09-08 MED ORDER — CHLORHEXIDINE GLUCONATE CLOTH 2 % EX PADS
6.0000 | MEDICATED_PAD | Freq: Every day | CUTANEOUS | Status: DC
Start: 2024-09-08 — End: 2024-09-11

## 2024-09-08 NOTE — ED Notes (Signed)
 Report given to dialysis.

## 2024-09-08 NOTE — Progress Notes (Signed)
 Auth received for [place] [phone], valid 10/17-10/22).  Message sent to Renal Navigator re change in HD center to one that [facility] can accommodate ([facility], [facility], [facility], or [facility] on [address]).  Per Damien with [facility] [phone] they are potentially able to admit pt today or over weekend depending on new dialysis schedule and center.   Julien Das, MSW, LCSW [phone] (coverage)

## 2024-09-08 NOTE — Transitions of Care (Post Inpatient/ED Visit) (Signed)
   09/08/2024  Name: [name] [name] MRN: [id number] DOB: [birth year]  Today's TOC FU Call Status: Today's TOC FU Call Status:: Unsuccessful Call (3rd Attempt) Unsuccessful Call (1st Attempt) Date: 09/05/24 Unsuccessful Call (2nd Attempt) Date: 09/06/24 Unsuccessful Call (3rd Attempt) Date: 09/08/24  Attempted to reach the patient regarding the most recent Inpatient/ED visit.  Follow Up Plan: No further outreach attempts will be made at this time. We have been unable to contact the patient.  Signature Julian Lemmings, LPN [facility] Nurse Health Advisor Direct Dial  [phone]

## 2024-09-08 NOTE — Progress Notes (Addendum)
 Contacted by SW/CM regarding need for a clinic transfer from [facility] to [facility] due to transient SNF placement. Will begin CLIP process now.   Lavanda Jashae Wiggs Dialysis Navigator [phone]  Addendum 3:28pm referral to [facility] admissions submitted in hopes of obtaining spot at [facility]. They will need to obtain his records from [facility] before moving fwd with financial and clinical clearance. will update when more is known. Will continue to monitor.

## 2024-09-08 NOTE — ED Notes (Signed)
 Hourly rounding complete. Pt watching TV

## 2024-09-08 NOTE — ED Provider Notes (Signed)
 Emergency Medicine Observation Re-evaluation Note  [name] [name] is a 53 y.o. male, seen on rounds today.  Pt initially presented to the ED for complaints of Weakness PMHx: DM, ESRD, depression, right BKA, anemia, HTN.  Patient recently was admitted 9/12 for right knee abscess, was discharged to SNF on 9/17, was discharged home from SNF on 10/14, however does not feel that he is able to care for himself at home  Currently, the patient is resting quietly in bed, eating breakfast. C/o pain in his leg.   Physical Exam  BP (!) 144/82 (BP Location: Right Arm)   Pulse 74   Temp (!) 97.4 F (36.3 C) (Oral)   Resp 20   Ht 6' 5 (1.956 m)   Wt 100 kg   SpO2 99%   BMI 26.14 kg/m  Physical Exam General: NAD Lungs: Normal effort Cardiac: well perfused MSK: S/p R BKA, wrapped Psych: Currently calm  ED Course / MDM  EKG:EKG Interpretation Date/Time:  Tuesday September 05 2024 13:18:18 EDT Ventricular Rate:  82 PR Interval:  202 QRS Duration:  94 QT Interval:  402 QTC Calculation: 469 R Axis:   70  Text Interpretation: Normal sinus rhythm Confirmed by Melvenia Motto [phone]) on 09/05/2024 9:37:38 PM  I have reviewed the labs performed to date as well as medications administered while in observation.  Recent changes in the last 24 hours include no new interventions since initial evaluation.  -Patient is dialysis patient TuThSat and did not get dialysis yesterday, Thursday 10/16. Nephrology had been consulted but unclear if EDP closed loop. No orders have been placed. Will reach out to nephrology this AM.   Clinical Course as of 09/08/24 0856  Fri Sep 08, 2024  9144 Messaged with Dr. Geralynn with nephrology who is made aware of patient and will get patient dialysis today.  [HN]    Clinical Course User Index [HN] Franklyn Sid SAILOR, MD      Plan  Current plan is for [facility] evaluation for dialysis, SNF placement.     Franklyn Sid SAILOR, MD 09/08/24 [phone]

## 2024-09-08 NOTE — ED Notes (Signed)
 Pt transported to dialysis

## 2024-09-08 NOTE — Progress Notes (Addendum)
 Met with [name] [name] and provided current SNF offers.  Patient accepted [facility].  Confirmed bed with Damien at [facility].  [name] [name] verbalized understanding he will need to dc to SNF under his [facility] Medicare and transition his Medicaid to LTC as this is not currently in place.  If auth received for [facility], [name] [name]'s HD center will need to be changed.  [facility] can accommodate HD at [facility], [facility], [facility], or [facility] on [address].  [facility] [employer] auth request submitted, status pending, ref #[id number]   Julien Das, MSW, LCSW [phone] (coverage)

## 2024-09-08 NOTE — ED Notes (Signed)
 Assumed care for the patient at this time. Pt resting in bed comfortably.

## 2024-09-08 NOTE — Progress Notes (Signed)
 Received patient in bed.Alert and oriented x 4. He signed his hemodialysis treatment's consent.  Access used: Right hd catheter that worked well.Dressing changed done today.  Duration of treatment : 3 hours.  Uf goal: Met 4 L ,tolerated treatment.   Hand off to the patient's nurse,back to ED with stable vitals via transporter.

## 2024-09-08 NOTE — Consult Note (Signed)
 Renal Service Consult Note [facility] Lamar JONETTA Fret, MD  Patient: [name] [name] Date: 09/08/2024 Requesting Physician: Dr. Franklyn  Reason for Consult: ESRD pt w/ gen'd weakness HPI: The patient is a 53 y.o. year-old w/ PMH as below who presented to ED c/o gen'd weakness, too weak to be at home. Can't get to HD. HD is TTS. Pt dc'd from SNF on 10/13. Plan is for TOC placement. Pt is to board in the ED. VS today ar eBP 190/93, HR 76, RR 18, temp 98.4.  98% sat on RA. We are asked to see for dialysis.    Pt seen in ED room.  Denies any SOB, CP or abd pain.    ROS - denies CP, no joint pain, no HA, no blurry vision, no rash, no diarrhea, no nausea/ vomiting   Past Medical History  Past Medical History:  Diagnosis Date   Abdominal pain 11/30/2023   Abdominal tenderness 10/14/2023   Allergy    seasonal and environmental   Anemia    hx blood transfusion 04/27/24   Chest pain 12/01/2023   Chronic constipation 05/13/2020   Depression    Diabetes mellitus without complication (HCC) 2019   Dyspnea    hx - SOB with exertion   ESRD on hemodialysis (HCC)    Tues, Thurs, Sat   GERD (gastroesophageal reflux disease)    Hyperlipidemia    Hypertension    Hypertensive urgency 01/24/2024   Hypotension 12/01/2023   Necrotizing fasciitis (HCC) 10/13/2017   Neuromuscular disorder (HCC)    neuropathy   PAD (peripheral artery disease)    Patellar tendinitis 10/12/2023   Secondary hyperparathyroidism    SVT (supraventricular tachycardia)    hx   Wound dehiscence 11/24/2017   Past Surgical History  Past Surgical History:  Procedure Laterality Date   A/V FISTULAGRAM N/A 03/17/2024   Procedure: A/V Fistulagram;  Surgeon: Magda Debby SAILOR, MD;  Location: [facility] INVASIVE CV LAB;  Service: Cardiovascular;  Laterality: N/A;   AMPUTATION Right 10/13/2017   Procedure: RIGHT BELOW KNEE AMPUTATION;  Surgeon: Harden Jerona GAILS, MD;  Location: [facility] OR;  Service: Orthopedics;  Laterality:  Right;   AMPUTATION Right 11/24/2017   Procedure: AMPUTATION BELOW KNEE REVISION;  Surgeon: Harden Jerona GAILS, MD;  Location: [facility] OR;  Service: Orthopedics;  Laterality: Right;   AMPUTATION TOE Left    AV FISTULA PLACEMENT Left 10/19/2023   Procedure: LEFT BRACHIOCEPHALIC ARTERIOVENOUS (AV) FISTULA CREATION;  Surgeon: Pearline Norman RAMAN, MD;  Location: [facility] OR;  Service: Vascular;  Laterality: Left;   AV FISTULA PLACEMENT Left 01/26/2024   Procedure: LEFT ARTERIOVENOUS (AV) FISTULA CREATION;  Surgeon: Pearline Norman RAMAN, MD;  Location: [facility] OR;  Service: Vascular;  Laterality: Left;   BASCILIC VEIN TRANSPOSITION Left 04/26/2024   Procedure: LEFT SECOND STAGE TRANSPOSITION, VEIN, BASILIC;  Surgeon: Magda Debby SAILOR, MD;  Location[facility] OR;  Service: Vascular;  Laterality: Left;   BIOPSY  09/02/2021   Procedure: BIOPSY;  Surgeon: Federico Rosario BROCKS, MD;  Location: [facility] ENDOSCOPY;  Service: Gastroenterology;;   BIOPSY  02/18/2023   Procedure: BIOPSY;  Surgeon: Leigh Elspeth SQUIBB, MD;  Location: [facility] ENDOSCOPY;  Service: Gastroenterology;;   COLONOSCOPY  05/11/2019   COLONOSCOPY  2021   3 polyps - tubular adenomas   ESOPHAGOGASTRODUODENOSCOPY (EGD) WITH PROPOFOL  N/A 09/02/2021   Procedure: ESOPHAGOGASTRODUODENOSCOPY (EGD) WITH PROPOFOL ;  Surgeon: Federico Rosario BROCKS, MD;  Location: [facility] ENDOSCOPY;  Service: Gastroenterology;  Laterality: N/A;   ESOPHAGOGASTRODUODENOSCOPY (EGD) WITH PROPOFOL  N/A 02/18/2023  Procedure: ESOPHAGOGASTRODUODENOSCOPY (EGD) WITH PROPOFOL ;  Surgeon: Leigh Elspeth SQUIBB, MD;  Location: [facility] ENDOSCOPY;  Service: Gastroenterology;  Laterality: N/A;   INCISION AND DRAINAGE OF WOUND Left 05/17/2024   Procedure: IRRIGATION AND DEBRIDEMENT WOUND;  Surgeon: Serene Gaile ORN, MD;  Location: [facility] OR;  Service: Vascular;  Laterality: Left;  Arm   INTRAMEDULLARY (IM) NAIL INTERTROCHANTERIC Right 10/15/2023   Procedure: INTRAMEDULLARY (IM) NAIL INTERTROCHANTERIC;  Surgeon: Kendal Franky SQUIBB, MD;  Location: [facility] OR;   Service: Orthopedics;  Laterality: Right;   IR FLUORO GUIDE CV LINE RIGHT  10/15/2023   IR TUNNELED CENTRAL VENOUS CATH PLC W IMG  07/31/2024   IR US  GUIDE VASC ACCESS RIGHT  10/15/2023   IRRIGATION AND DEBRIDEMENT KNEE Right 08/04/2024   Procedure: IRRIGATION AND DEBRIDEMENT KNEE, RIGHT;  Surgeon: Harden Jerona GAILS, MD;  Location: [facility] OR;  Service: Orthopedics;  Laterality: Right;  Debridement Right Popliteal Fossa Abscess   POLYPECTOMY     WISDOM TOOTH EXTRACTION     Family History  Family History  Problem Relation Age of Onset   Diabetes Mellitus II Mother    Depression Mother    Colon polyps Mother    Hypertension Mother    High Cholesterol Mother    Heart disease Father        CABG x 4.  04/2017   Diabetes Father    High Cholesterol Father    Hypertension Father    Heart disease Maternal Grandmother    Heart disease Maternal Grandfather    Colon cancer Neg Hx    Esophageal cancer Neg Hx    Liver cancer Neg Hx    Pancreatic cancer Neg Hx    Rectal cancer Neg Hx    Stomach cancer Neg Hx    Social History  reports that he has been smoking cigarettes. He started smoking about 35 years ago. He has a 18 pack-year smoking history. He has never used smokeless tobacco. He reports that he does not drink alcohol and does not use drugs. Allergies  Allergies  Allergen Reactions   Iron  Nausea And Vomiting    With the iron  tablets; patient can take iron  injections   Tape Rash    Rash at site of tape--okay to use paper tape   Wound Dressing Adhesive Rash    Pt can use paper tape.    Home medications Prior to Admission medications   Medication Sig Start Date End Date Taking? Authorizing Provider  albuterol  (VENTOLIN  HFA) 108 (90 Base) MCG/ACT inhaler Inhale 2 puffs into the lungs every 6 (six) hours as needed for wheezing or shortness of breath. 06/13/24  Yes Hindel, Leah, MD  atorvastatin  (LIPITOR) 40 MG tablet Take 1 tablet (40 mg total) by mouth daily. 07/13/24  Yes Hindel, Rea, MD   Calcium  Acetate 667 MG TABS Take 1,334 mg by mouth 3 (three) times daily. Patient taking differently: Take 1,334 mg by mouth 2 (two) times daily.   Yes [provider]  carvedilol  (COREG ) 12.5 MG tablet Take 1 tablet (12.5 mg total) by mouth 2 (two) times daily with a meal. 07/07/24  Yes Hindel, Leah, MD  diltiazem  (CARDIZEM  CD) 300 MG 24 hr capsule Take 1 capsule (300 mg total) by mouth daily. Patient taking differently: Take 300 mg by mouth at bedtime. 07/01/24  Yes Hindel, Leah, MD  FLUoxetine  (PROZAC ) 40 MG capsule Take 1 capsule (40 mg total) by mouth daily. 06/28/24  Yes Lorrane Pac, MD  fluticasone  (FLONASE ) 50 MCG/ACT nasal spray Place 2 sprays into  both nostrils daily. Patient taking differently: Place 2 sprays into both nostrils daily as needed for allergies. 05/11/24  Yes Jagadish, Mayuri, MD  Fluticasone -Umeclidin-Vilant (TRELEGY ELLIPTA ) 100-62.5-25 MCG/ACT AEPB INHALE 1 PUFF INTO LUNGS DAILY 05/15/24  Yes Christia Budds, MD  furosemide  (LASIX ) 40 MG tablet Take 40 mg by mouth 2 (two) times daily.   Yes [provider]  gabapentin  (NEURONTIN ) 300 MG capsule TAKE 1 CAPSULE(300 MG) BY MOUTH DAILY 07/31/24  Yes Hindel, Leah, MD  hydrALAZINE  (APRESOLINE ) 100 MG tablet Take 2 tablets (200 mg total) by mouth 2 (two) times daily. 07/14/24  Yes Baker, Raguel MATSU, DO  insulin  glargine (LANTUS ) 100 UNIT/ML injection Inject 0.22 mLs (22 Units total) into the skin daily. 08/09/24  Yes Gomes, Adriana, DO  insulin  lispro (HUMALOG ) 100 UNIT/ML injection 0-6 Units, Subcutaneous, 3 times daily with meals, First dose on Sun 10/17/23 at 0800 Correction coverage: Very Sensitive (ESRD/Dialysis) CBG < 70: Implement Hypoglycemia Standing Orders and refer to Hypoglycemia Standing Orders sidebar report CBG 70 - 120: 0 units CBG 121 - 150: 0 units CBG 151 - 200: 1 unit CBG 201-250: 2 units CBG 251-300: 3 units CBG 301-350: 4 units CBG 351-400: 5 units CBG > 400: Give 6 units and call MD  10/26/23  Yes Baloch, Mahnoor, MD  methocarbamol  (ROBAXIN ) 750 MG tablet Take 1 tablet (750 mg total) by mouth every 8 (eight) hours as needed for muscle spasms. 07/14/24  Yes Baker, Amelia G, DO  ondansetron  (ZOFRAN -ODT) 4 MG disintegrating tablet Take 1 tablet (4 mg total) by mouth every 8 (eight) hours as needed for nausea or vomiting. 12/12/23  Yes Jagadish, Mayuri, MD  Oxycodone  HCl 10 MG TABS Take 1 tablet (10 mg total) by mouth every 4 (four) hours as needed. 08/09/24  Yes Delores Suzann HERO, MD  pantoprazole  (PROTONIX ) 40 MG tablet Take 1 tablet (40 mg total) by mouth daily. 08/10/24  Yes Hindel, Leah, MD  polyethylene glycol (MIRALAX  / GLYCOLAX ) 17 g packet Take 17 g by mouth daily. 08/09/24  Yes Janna Ferrier, DO  tamsulosin  (FLOMAX ) 0.4 MG CAPS capsule Take 1 capsule (0.4 mg total) by mouth daily. 07/27/24  Yes Hindel, Rea, MD     Vitals:   09/07/24 1510 09/07/24 1700 09/07/24 2225 09/08/24 0609  BP: (!) 162/84 (!) 164/85 (!) 159/88 (!) 144/82  Pulse:  78 74 74  Resp:  18 18 20   Temp:  98.4 F (36.9 C)  (!) 97.4 F (36.3 C)  TempSrc:  Oral  Oral  SpO2:  99% 99% 99%  Weight:      Height:       Exam Gen alert, no distress, on RA Sclera anicteric, throat clear  No jvd or bruits Chest clear bilat to bases RRR no MRG Abd soft ntnd no mass or ascites +bs Ext R BKA, LLE no edema, no other edema Neuro is alert, Ox 3 , nf    [facility] TDC/ LUA AVF+bruit   Home bp meds: Coreg  12.5 bid Cardizem  CD 300 daily Hydralazine  200mg  bid   [facility]  B400  100.4kg  TDC/ AVF Hep 2000 Last OP HD 10/14, post wt 107.6    Assessment/ Plan: Gen'd weakness: too weak to care for himself at home. Pt boarding in ED, awaiting TOC placement.  ESRD: on HD TTS. Missed HD yest. Plan short HD today, regular HD tomorrow.  HTN: BP's high, cont home meds, get vol down w/ HD Volume: came off 7 kg over last  OP HD on Tuesday. Max UF w/ HD today and tomorrow.  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  Follow.        Myer Fret  MD [facility] 09/08/2024, 10:13 AM  Recent Labs  Lab 09/05/24 1312  HGB 9.0*  ALBUMIN  3.0*  CALCIUM  8.8*  CREATININE 4.10*  K 4.1   Inpatient medications:  acetaminophen   1,000 mg Oral Q6H   atorvastatin   40 mg Oral Daily   carvedilol   12.5 mg Oral BID WC   diltiazem   300 mg Oral Daily   FLUoxetine   40 mg Oral Daily   gabapentin   300 mg Oral BID   hydrALAZINE   100 mg Oral BID   insulin  aspart  0-5 Units Subcutaneous QHS   insulin  aspart  0-9 Units Subcutaneous TID WC   insulin  aspart  2 Units Subcutaneous TID WC   pantoprazole   40 mg Oral Daily   polyethylene glycol  17 g Oral Daily    albuterol , methocarbamol , oxyCODONE 

## 2024-09-08 NOTE — ED Notes (Signed)
 Hourly rounding complete. Pt resting in bed comfortably, TV remote provided per request.

## 2024-09-08 NOTE — ED Notes (Signed)
 Patient wanted bacon added to his breakfast meal tray. This RN called and placed the order.

## 2024-09-08 NOTE — ED Notes (Signed)
 Hourly rounding complete. Pt resting and watching TV in bed.

## 2024-09-08 NOTE — ED Notes (Signed)
 Hourly rounding complete. Pt asleep.

## 2024-09-08 NOTE — ED Notes (Signed)
 Secure chat sent to EDP following up on nephrology consult from yesterday. Pt usually receives dialysis Tu Th Sat, did not receive yesterday.

## 2024-09-09 LAB — CBC
HCT: 28.5 % — ABNORMAL LOW (ref 39.0–52.0)
Hemoglobin: 8.5 g/dL — ABNORMAL LOW (ref 13.0–17.0)
MCH: 23.1 pg — ABNORMAL LOW (ref 26.0–34.0)
MCHC: 29.8 g/dL — ABNORMAL LOW (ref 30.0–36.0)
MCV: 77.4 fL — ABNORMAL LOW (ref 80.0–100.0)
Platelets: 116 K/uL — ABNORMAL LOW (ref 150–400)
RBC: 3.68 MIL/uL — ABNORMAL LOW (ref 4.22–5.81)
RDW: 25.2 % — ABNORMAL HIGH (ref 11.5–15.5)
WBC: 8.1 K/uL (ref 4.0–10.5)
nRBC: 0 % (ref 0.0–0.2)

## 2024-09-09 LAB — RENAL FUNCTION PANEL
Albumin: 2.5 g/dL — ABNORMAL LOW (ref 3.5–5.0)
Anion gap: 12 (ref 5–15)
BUN: 35 mg/dL — ABNORMAL HIGH (ref 6–20)
CO2: 25 mmol/L (ref 22–32)
Calcium: 7.7 mg/dL — ABNORMAL LOW (ref 8.9–10.3)
Chloride: 94 mmol/L — ABNORMAL LOW (ref 98–111)
Creatinine, Ser: 6.87 mg/dL — ABNORMAL HIGH (ref 0.61–1.24)
GFR, Estimated: 9 mL/min — ABNORMAL LOW (ref >60)
Glucose, Bld: 121 mg/dL — ABNORMAL HIGH (ref 70–99)
Phosphorus: 5.1 mg/dL — ABNORMAL HIGH (ref 2.5–4.6)
Potassium: 5.4 mmol/L — ABNORMAL HIGH (ref 3.5–5.1)
Sodium: 131 mmol/L — ABNORMAL LOW (ref 135–145)

## 2024-09-09 LAB — CBG MONITORING, ED
Glucose-Capillary: 133 mg/dL — ABNORMAL HIGH (ref 70–99)
Glucose-Capillary: 116 mg/dL — ABNORMAL HIGH (ref 70–99)
Glucose-Capillary: 257 mg/dL — ABNORMAL HIGH (ref 70–99)

## 2024-09-09 LAB — HEPATITIS B SURFACE ANTIGEN: Hepatitis B Surface Ag: NONREACTIVE

## 2024-09-09 MED ORDER — HEPARIN SODIUM (PORCINE) 1000 UNIT/ML IJ SOLN
INTRAMUSCULAR | Status: AC
  Filled 2024-09-09: qty 5

## 2024-09-09 MED ORDER — OXYCODONE HCL 5 MG PO TABS
ORAL_TABLET | ORAL | Status: AC
  Filled 2024-09-09: qty 2

## 2024-09-09 MED ORDER — CALCIUM ACETATE (PHOS BINDER) 667 MG PO CAPS
1334.0000 mg | ORAL_CAPSULE | Freq: Three times a day (TID) | ORAL | Status: DC
  Administered 2024-09-09 – 2024-09-11 (×6): 1334 mg via ORAL
  Filled 2024-09-09 (×6): qty 2

## 2024-09-09 NOTE — ED Notes (Signed)
 Safety rounding complete. Pt asleep.

## 2024-09-09 NOTE — ED Notes (Signed)
 Spoke with dialysis at this time. Pt to be placed in for transport to dialysis.

## 2024-09-09 NOTE — ED Notes (Signed)
 Hourly rounding complete. Pt asleep.

## 2024-09-09 NOTE — Progress Notes (Signed)
 [facility] Progress Note   Subjective:   Patient seen and examined at bedside in dialysis. Tolerating treatment well so far. Continues to have weakness.  No other specific complaints.  Boarding in the ED, waiting for SNF placement.   Objective Vitals:   09/09/24 1315 09/09/24 1327 09/09/24 1331 09/09/24 1538  BP: (!) (P) 184/89 (!) 195/87 (!) 196/90 (!) 191/92  Pulse: (P) 82 98 85 91  Resp: (P) 14 16 (!) 24 18  Temp:   98.3 F (36.8 C) 97.8 F (36.6 C)  TempSrc:    Oral  SpO2: (P) 98% 100% 100% 95%  Weight:   100.2 kg   Height:       Physical Exam General:chronically ill appearing male in NAD Heart:RRR Lungs:CTAB, nml WOB on RA Abdomen:soft, NT Extremities:chronic skin changes in LLE, trace edema, R BKA Dialysis Access: [facility], LU AVF maturing   Filed Weights   09/08/24 1742 09/09/24 0859 09/09/24 1331  Weight: 105.7 kg 106.9 kg 100.2 kg    Intake/Output Summary (Last 24 hours) at 09/09/2024 1554 Last data filed at 09/09/2024 1331 Gross per 24 hour  Intake --  Output 8000 ml  Net -8000 ml    Additional Objective Labs: Basic Metabolic Panel: Recent Labs  Lab 09/05/24 1312 09/08/24 1520 09/09/24 0804  NA 131* 129* 131*  K 4.1 6.2* 5.4*  CL 94* 94* 94*  CO2 23 22 25   GLUCOSE 105* 192* 121*  BUN 16 53* 35*  CREATININE 4.10* 8.87* 6.87*  CALCIUM  8.8* 7.2* 7.7*  PHOS  --  6.2* 5.1*   Liver Function Tests: Recent Labs  Lab 09/05/24 1312 09/08/24 1520 09/09/24 0804  AST 16  --   --   ALT 10  --   --   ALKPHOS 103  --   --   BILITOT 0.8  --   --   PROT 6.5  --   --   ALBUMIN  3.0* 2.6* 2.5*    CBC: Recent Labs  Lab 09/05/24 1312 09/08/24 1520  WBC 8.2 6.8  HGB 9.0* 8.3*  HCT 31.0* 28.5*  MCV 77.3* 77.7*  PLT 169 119*   Blood Culture    Component Value Date/Time   SDES KNEE 07/29/2024 1414   SPECREQUEST ABSCESS POSTERIOR KNEE 07/29/2024 1414   CULT  07/29/2024 1414    NO GROWTH 3 DAYS Performed at [facility],  [address], [place], [place] [address]    REPTSTATUS 08/01/2024 FINAL 07/29/2024 1414   Studies/Results: No results found.  Medications:   acetaminophen   1,000 mg Oral Q6H   atorvastatin   40 mg Oral Daily   carvedilol   12.5 mg Oral BID WC   Chlorhexidine  Gluconate Cloth  6 each Topical Q0600   Chlorhexidine  Gluconate Cloth  6 each Topical Q0600   diltiazem   300 mg Oral QHS   FLUoxetine   40 mg Oral Daily   gabapentin   300 mg Oral QHS   hydrALAZINE   100 mg Oral BID   insulin  aspart  0-5 Units Subcutaneous QHS   insulin  aspart  0-9 Units Subcutaneous TID WC   insulin  aspart  2 Units Subcutaneous TID WC   pantoprazole   40 mg Oral Daily   polyethylene glycol  17 g Oral Daily    Dialysis Orders: [facility] 4h  B400  100.4kg  TDC/ AVF Hep 2000 Last OP HD 10/14, post wt 107.6 Calcitriol  0.46mcg qHD Mircera 225mcg q2wks - last 10/4 Venofer  50mg  qwk    Assessment/ Plan: Gen'd  weakness: too weak to care for himself at home. Pt boarding in ED, awaiting TOC placement.  Hyperkalemia - K 6.2 yesterday, improved with HD.   ESRD: on HD TTS. HD today on regular schedule.  Next HD on 09/12/24.   HTN: BP high. Continue home meds.  Volume likely contributing. Volume: does not appear grossly overloaded on exam but per weights 6L over dry. Max UF w/ HD today and tomorrow. On RA.  Anemia of esrd: Hb 8.3 Follow. ESA due today.  If drop <8 will order while here.  Secondary hyperparathyroidism - corrected Ca in goal. Check phos. Continue VDRA and binders.  Nutrition - renal diet w/fluid restrictions  Manuelita Labella, PA-C [facility] 09/09/2024,3:54 PM  LOS: 0 days

## 2024-09-09 NOTE — Progress Notes (Signed)
 4 liters ultrafiltration, condition stable and report was given to the primary RN.

## 2024-09-10 LAB — CBG MONITORING, ED
Glucose-Capillary: 147 mg/dL — ABNORMAL HIGH (ref 70–99)
Glucose-Capillary: 124 mg/dL — ABNORMAL HIGH (ref 70–99)
Glucose-Capillary: 154 mg/dL — ABNORMAL HIGH (ref 70–99)
Glucose-Capillary: 182 mg/dL — ABNORMAL HIGH (ref 70–99)

## 2024-09-10 NOTE — Progress Notes (Signed)
 [facility] Progress Note   Subjective:   Patient seen and examined at bedside in the ED.  Not happy about the renal diet.  Discussed risk of hyperkalemia associated with regular diet.  Reports SOB but does not want to go to dialysis any earlier than Tuesday.  Denies CP, abdominal pain and n/v/d.   Objective Vitals:   09/09/24 1842 09/09/24 2217 09/10/24 0539 09/10/24 0800  BP: (!) 182/93 (!) 177/86 (!) 158/84 (!) 158/84  Pulse: 88 85 80 80  Resp: 18 20 18    Temp: 98 F (36.7 C) 98.5 F (36.9 C) 98.4 F (36.9 C)   TempSrc: Oral     SpO2:  95% 95%   Weight:      Height:       Physical Exam General:chronically ill appearing male in NAD Heart:RRR, no mrg Lungs:CTAB, nml WOB on RA Abdomen:soft, NTND Extremities:+LE edema, Chronic skin changes on L, R BKA Dialysis Access: [facility], LU AVF maturing   Filed Weights   09/08/24 1742 09/09/24 0859 09/09/24 1331  Weight: 105.7 kg 106.9 kg 100.2 kg    Intake/Output Summary (Last 24 hours) at 09/10/2024 1240 Last data filed at 09/09/2024 1331 Gross per 24 hour  Intake --  Output 4000 ml  Net -4000 ml    Additional Objective Labs: Basic Metabolic Panel: Recent Labs  Lab 09/05/24 1312 09/08/24 1520 09/09/24 0804  NA 131* 129* 131*  K 4.1 6.2* 5.4*  CL 94* 94* 94*  CO2 23 22 25   GLUCOSE 105* 192* 121*  BUN 16 53* 35*  CREATININE 4.10* 8.87* 6.87*  CALCIUM  8.8* 7.2* 7.7*  PHOS  --  6.2* 5.1*   Liver Function Tests: Recent Labs  Lab 09/05/24 1312 09/08/24 1520 09/09/24 0804  AST 16  --   --   ALT 10  --   --   ALKPHOS 103  --   --   BILITOT 0.8  --   --   PROT 6.5  --   --   ALBUMIN  3.0* 2.6* 2.5*   CBC: Recent Labs  Lab 09/05/24 1312 09/08/24 1520 09/09/24 2245  WBC 8.2 6.8 8.1  HGB 9.0* 8.3* 8.5*  HCT 31.0* 28.5* 28.5*  MCV 77.3* 77.7* 77.4*  PLT 169 119* 116*   CBG: Recent Labs  Lab 09/09/24 0832 09/09/24 1531 09/09/24 2218 09/10/24 0734 09/10/24 1126  GLUCAP 133* 116* 257* 147*  124*   Medications:   acetaminophen   1,000 mg Oral Q6H   atorvastatin   40 mg Oral Daily   calcium  acetate  1,334 mg Oral TID WC   carvedilol   12.5 mg Oral BID WC   Chlorhexidine  Gluconate Cloth  6 each Topical Q0600   Chlorhexidine  Gluconate Cloth  6 each Topical Q0600   diltiazem   300 mg Oral QHS   FLUoxetine   40 mg Oral Daily   gabapentin   300 mg Oral QHS   hydrALAZINE   100 mg Oral BID   insulin  aspart  0-5 Units Subcutaneous QHS   insulin  aspart  0-9 Units Subcutaneous TID WC   insulin  aspart  2 Units Subcutaneous TID WC   pantoprazole   40 mg Oral Daily   polyethylene glycol  17 g Oral Daily    Dialysis Orders: TTS [facility] 4h  B400  100.4kg  TDC/ AVF Hep 2000 Last OP HD 10/14, post wt 107.6 Calcitriol  0.38mcg qHD Mircera 225mcg q2wks - last 10/4 Venofer  50mg  qwk    Assessment/ Plan: Gen'd weakness: too weak to care for himself at  home. Pt boarding in ED, awaiting TOC placement.  Hyperkalemia - improved with HD.  Lokelma  orders on off days at home but does not recall taking it. On renal diet. Follow labs. Prn lokelma .  ESRD: on HD TTS.  Next HD on 09/12/24.   HTN: BP better today. Continue home meds.  Volume likely contributing. Volume: Improved.  History large gains between treatments. Continue max UF as tolerated.  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 Follow. ESA due today.  If drop <8 will order while here.  Secondary hyperparathyroidism - corrected Ca and phos in goal. Continue VDRA and binders.  Nutrition - renal diet w/fluid restrictions  Manuelita Labella, PA-C [facility] 09/10/2024,12:40 PM  LOS: 0 days

## 2024-09-10 NOTE — ED Notes (Signed)
 Pt given toothbrush and toothpaste and new paper scrubs to change into as well as deodorant.

## 2024-09-10 NOTE — ED Notes (Signed)
 Pt given BSC and pt was able to transfer to [facility] where he voided and had a large BM.  Pt sheets changed after cleaning bed.  Pt given warm water and toiletries to wash up and change.    Pt ate half of dinner tray and began feeling nauseated (he states that this occurs occasionally)

## 2024-09-10 NOTE — ED Notes (Signed)
 Dressing change to behind right knee.  Picture of wound placed on chart (see media).  Per order I cleansed R stump/knee wound with Vashe wound cleanser and allowed to air dry. Applied Vashe moistened gauze to wound bed and covered with ABD pad and secured with Kerlix roll gauze. Applied stump cover sock pt has and then Ace bandage over kerlix for light compression. Pt tolerated this well.  Pt has incredibly dry skin, especially on left leg.  Applied lotion.   Pt dinner warmed up and given to pt.  No distress at this time.

## 2024-09-10 NOTE — ED Provider Notes (Signed)
 Emergency Medicine Observation Re-evaluation Note  [name] [name] is a 53 y.o. male, seen on rounds today.  Pt initially presented to the ED for complaints of Weakness Currently, the patient is sleeping and in no acute distress.  Physical Exam  BP (!) 158/84   Pulse 80   Temp 98.4 F (36.9 C)   Resp 18   Ht 6' 5 (1.956 m)   Wt 100.2 kg   SpO2 95%   BMI 26.19 kg/m  Physical Exam General: No acute distress Cardiac: Regular Lungs: Clear   ED Course / MDM  EKG:EKG Interpretation Date/Time:  Tuesday September 05 2024 13:18:18 EDT Ventricular Rate:  82 PR Interval:  202 QRS Duration:  94 QT Interval:  402 QTC Calculation: 469 R Axis:   70  Text Interpretation: Normal sinus rhythm Confirmed by Melvenia Motto [phone]) on 09/05/2024 9:37:38 PM  I have reviewed the labs performed to date as well as medications administered while in observation.  Recent changes in the last 24 hours include none.  Plan  Current plan is for placement.  Patient getting dialysis regularly.    Doretha Folks, MD 09/10/24 1119

## 2024-09-11 DIAGNOSIS — R531 Weakness: Secondary | ICD-10-CM | POA: Diagnosis not present

## 2024-09-11 DIAGNOSIS — M7989 Other specified soft tissue disorders: Secondary | ICD-10-CM | POA: Diagnosis not present

## 2024-09-11 DIAGNOSIS — Z89412 Acquired absence of left great toe: Secondary | ICD-10-CM | POA: Diagnosis not present

## 2024-09-11 DIAGNOSIS — I739 Peripheral vascular disease, unspecified: Secondary | ICD-10-CM | POA: Diagnosis not present

## 2024-09-11 DIAGNOSIS — K219 Gastro-esophageal reflux disease without esophagitis: Secondary | ICD-10-CM | POA: Diagnosis not present

## 2024-09-11 DIAGNOSIS — M6281 Muscle weakness (generalized): Secondary | ICD-10-CM | POA: Diagnosis not present

## 2024-09-11 DIAGNOSIS — M25561 Pain in right knee: Secondary | ICD-10-CM | POA: Diagnosis not present

## 2024-09-11 DIAGNOSIS — L02415 Cutaneous abscess of right lower limb: Secondary | ICD-10-CM | POA: Diagnosis not present

## 2024-09-11 DIAGNOSIS — Z794 Long term (current) use of insulin: Secondary | ICD-10-CM | POA: Diagnosis not present

## 2024-09-11 DIAGNOSIS — R262 Difficulty in walking, not elsewhere classified: Secondary | ICD-10-CM | POA: Diagnosis not present

## 2024-09-11 DIAGNOSIS — E1165 Type 2 diabetes mellitus with hyperglycemia: Secondary | ICD-10-CM | POA: Diagnosis not present

## 2024-09-11 DIAGNOSIS — I10 Essential (primary) hypertension: Secondary | ICD-10-CM | POA: Diagnosis not present

## 2024-09-11 DIAGNOSIS — L97812 Non-pressure chronic ulcer of other part of right lower leg with fat layer exposed: Secondary | ICD-10-CM | POA: Diagnosis not present

## 2024-09-11 DIAGNOSIS — I120 Hypertensive chronic kidney disease with stage 5 chronic kidney disease or end stage renal disease: Secondary | ICD-10-CM | POA: Diagnosis not present

## 2024-09-11 DIAGNOSIS — Z992 Dependence on renal dialysis: Secondary | ICD-10-CM | POA: Diagnosis not present

## 2024-09-11 DIAGNOSIS — E1122 Type 2 diabetes mellitus with diabetic chronic kidney disease: Secondary | ICD-10-CM | POA: Diagnosis not present

## 2024-09-11 DIAGNOSIS — Z89511 Acquired absence of right leg below knee: Secondary | ICD-10-CM | POA: Diagnosis not present

## 2024-09-11 DIAGNOSIS — R2689 Other abnormalities of gait and mobility: Secondary | ICD-10-CM | POA: Diagnosis not present

## 2024-09-11 DIAGNOSIS — D631 Anemia in chronic kidney disease: Secondary | ICD-10-CM | POA: Diagnosis not present

## 2024-09-11 DIAGNOSIS — N186 End stage renal disease: Secondary | ICD-10-CM | POA: Diagnosis not present

## 2024-09-11 DIAGNOSIS — Z79899 Other long term (current) drug therapy: Secondary | ICD-10-CM | POA: Diagnosis not present

## 2024-09-11 LAB — CBG MONITORING, ED
Glucose-Capillary: 148 mg/dL — ABNORMAL HIGH (ref 70–99)
Glucose-Capillary: 202 mg/dL — ABNORMAL HIGH (ref 70–99)

## 2024-09-11 MED ORDER — OXYCODONE HCL 10 MG PO TABS: 10.0000 mg | ORAL_TABLET | ORAL | 0 refills | Status: DC | PRN

## 2024-09-11 MED ORDER — CHLORHEXIDINE GLUCONATE CLOTH 2 % EX PADS: 6.0000 | MEDICATED_PAD | Freq: Every day | CUTANEOUS | Status: DC

## 2024-09-11 MED ORDER — OXYCODONE HCL 10 MG PO TABS: 10.0000 mg | ORAL_TABLET | Freq: Three times a day (TID) | ORAL | 0 refills | Status: DC | PRN

## 2024-09-11 NOTE — Discharge Instructions (Addendum)
 [name] [name] has obtained a TTS schedule at [facility] clinic, chair time of 11am. Can start tomorrow 10/21, will need to arrive 1030am for paperwork tomorrow.   Follow-up with your primary doctor in 2 to 3 days  Return to the emergency department for any concerning symptoms

## 2024-09-11 NOTE — Progress Notes (Signed)
 Pt has obtained a TTS schedule at [facility] clinic, chair time of 11am. Can start tomorrow 10/21, will need to arrive 1030am for paperwork tomorrow. CSW notified [facility] and confirmed this chair time/location will work. Confirmed pt can admit today.   Pt will DC to [facility] room 143. RN to call report to [phone] and arrange PTAR when ready.

## 2024-09-11 NOTE — Progress Notes (Addendum)
 Reached out to [facility] admissions, [facility] showing full, but attempting to expedite and find spot. Will provide update when available.   [name] [name] Dialysis Navigator [phone]  Addendum 1220 Pt has obtained a TTS schedule at [facility] clinic, chair time of 11am. Can start tomorrow 10/21, will need to arrive 1030am for paperwork tomorrow. Care team including MD, nephrologist, CM, SW, and RN informed. AVS updated.  Addendum/late note entry 10/20 3:02pm D/c noted. Contacted out-pt HD clinic to inform of pt d/c and anticipated arrival for 1st appt tomorrow. No further support needed.

## 2024-09-11 NOTE — ED Provider Notes (Signed)
 Patient has outpatient dialysis set up and SNF it is ready to accept the patient.  He is overall well-appearing.  No new symptoms or complaints.***

## 2024-09-11 NOTE — Discharge Planning (Signed)
 [facility]  Initial Hemodialysis Orders  Dialysis center: [facility]  Patient's name: [name] [name] DOB: 08-Nov-1971 AKI or ESRD: ESRD  Discharge diagnosis: Generalized weakness/deconditioning 2.  ESRD (transfer from [facility])  Allergies:  Allergies  Allergen Reactions   Iron  Nausea And Vomiting    With the iron  tablets; patient can take iron  injections   Tape Rash    Rash at site of tape--okay to use paper tape   Wound Dressing Adhesive Rash    Pt can use paper tape.     Date of First Dialysis: 10/28/23 Cause of renal disease: diabetes mellitus  Dialysis Prescription: Dialysis Frequency: TIW Tx duration: 4 hours 15 min BFR: 400 DFR: 800 EDW: 100kg  Dialyzer: 180NRe UF profile/Sodium modeling?: no Dialysis Bath: 2 K 2 Ca  Dialysis access: Access type: [facility] Date placed: 10/15/23  Needle gauge: 15g  In Center Medications: Heparin  Dose: 2000 unit bolus  Type: pork VDRA: Calcitriol  0.5mcg PO q HD Venofer : 50mg  weekly Mircera: 225 mcg IV q 2 weeks Next dose due: next HD Sensipar: none  Discharge labs: Hgb: 8.5 K+: 5.4  Ca: 7. 7 Phos: 5.1 Alb: 2.5  Please draw routine labs. Additional labs needed: routine monthly labs Additional notes/follow-up: none

## 2024-09-11 NOTE — Progress Notes (Signed)
 Patient ID: [name] [name], male   DOB: 1971/08/03, 53 y.o.   MRN: [id number] [facility] Progress Note   Assessment/ Plan:   1.  Generalized weakness/deconditioning: With inability to live independently.  Currently boarding in the emergency room while awaiting placement to skilled nursing facility.  Process underway for placement to outpatient dialysis unit closer to SNF. 2. ESRD: Typically on hemodialysis on TTS schedule and declined additional of scheduled dialysis for management of hyperkalemia.  Getting Lokelma  on nondialysis days for potassium control. 3. Anemia: Low hemoglobin/hematocrit without any overt blood loss.  Monitor on ESA. 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  Continue calcium  acetate for phosphorus binding and monitor phosphorus trend. 5. Nutrition: Continue renal diet with fluid restriction for optimization of potassium/phosphorus control along with ongoing hemodialysis and medications. 6. Hypertension: Blood pressure marginally elevated, monitor on current antihypertensive therapy and UF on HD.  Subjective:   He again requests to be taken off of the renal diet because of limited choices-I informed him that this was done because of concerns of his hyperkalemia.   Objective:   BP (!) 155/83   Pulse 76   Temp 97.9 F (36.6 C) (Oral)   Resp 16   Ht 6' 5 (1.956 m)   Wt 100.2 kg   SpO2 98%   BMI 26.19 kg/m   Physical Exam: Gen: Appears comfortable resting in bed CVS: Pulse regular rhythm, normal rate, S1 and S2 normal Resp: Clear to auscultation bilaterally, no rales/rhonchi.  Right IJ TDC in place Abd: Soft, flat, nontender, bowel sounds normal Ext: Trace-1+ left lower extremity edema, status post right below-knee amputation.  Maturing left upper arm AV fistula.  Labs: BMET Recent Labs  Lab 09/05/24 1312 09/08/24 1520 09/09/24 0804  NA 131* 129* 131*  K 4.1 6.2* 5.4*  CL 94* 94* 94*  CO2  23 22 25   GLUCOSE 105* 192* 121*  BUN 16 53* 35*  CREATININE 4.10* 8.87* 6.87*  CALCIUM  8.8* 7.2* 7.7*  PHOS  --  6.2* 5.1*   CBC Recent Labs  Lab 09/05/24 1312 09/08/24 1520 09/09/24 2245  WBC 8.2 6.8 8.1  HGB 9.0* 8.3* 8.5*  HCT 31.0* 28.5* 28.5*  MCV 77.3* 77.7* 77.4*  PLT 169 119* 116*      Medications:     acetaminophen   1,000 mg Oral Q6H   atorvastatin   40 mg Oral Daily   calcium  acetate  1,334 mg Oral TID WC   carvedilol   12.5 mg Oral BID WC   Chlorhexidine  Gluconate Cloth  6 each Topical Q0600   Chlorhexidine  Gluconate Cloth  6 each Topical Q0600   diltiazem   300 mg Oral QHS   FLUoxetine   40 mg Oral Daily   gabapentin   300 mg Oral QHS   hydrALAZINE   100 mg Oral BID   insulin  aspart  0-5 Units Subcutaneous QHS   insulin  aspart  0-9 Units Subcutaneous TID WC   insulin  aspart  2 Units Subcutaneous TID WC   pantoprazole   40 mg Oral Daily   polyethylene glycol  17 g Oral Daily   Gordy Blanch, MD 09/11/2024, 11:02 AM

## 2024-09-11 NOTE — ED Provider Notes (Signed)
 Emergency Medicine Observation Re-evaluation Note  [name] [name] is a 53 y.o. male, seen on rounds today.  Pt initially presented to the ED for complaints of Weakness Currently, the patient is not having acute complaints.  Physical Exam  BP (!) 176/103 (BP Location: Right Arm)   Pulse 79   Temp 98 F (36.7 C) (Oral)   Resp 16   Ht 6' 5 (1.956 m)   Wt 100.2 kg   SpO2 100%   BMI 26.19 kg/m  Physical Exam General: Resting comfortably in stretcher Lungs: Normal work of breathing Psych: Calm  ED Course / MDM  EKG:EKG Interpretation Date/Time:  Tuesday September 05 2024 13:18:18 EDT Ventricular Rate:  82 PR Interval:  202 QRS Duration:  94 QT Interval:  402 QTC Calculation: 469 R Axis:   70  Text Interpretation: Normal sinus rhythm Confirmed by Melvenia Motto [phone]) on 09/05/2024 9:37:38 PM  I have reviewed the labs performed to date as well as medications administered while in observation.  Recent changes in the last 24 hours include HD performed yesterday. Still searching for placement.  Plan  Current plan is for placement.    [name] Lamar BROCKS, MD 09/11/24 [phone]

## 2024-09-11 NOTE — Progress Notes (Signed)
 Plan is for [facility] SNF pending OPHD being arranged at closer clinic. Pt has insurance auth approved until 10/21 though there is an additional 24hr grace period. ICM will continue to follow.

## 2024-09-11 NOTE — Progress Notes (Addendum)
 Physical Therapy Treatment Patient Details Name: [name] [name] MRN: [id number] DOB: [birth year] Today's Date: 09/11/2024   History of Present Illness [name] [name] is a 53 y.o. male who presented to [facility] ED 09/05/24 for generalized weakness. 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    PT Comments  Pt admitted with above diagnosis. Pt was able to perform LE exercises and practice sitting and standing.  Pt making slow progress overall due to limitations of allowing right BKA time to heal.  Of note, pt scoots right and left well on bed.  Continue to recommend post acute rehab < 3 hours day.  Pt currently with functional limitations due to the deficits listed below (see PT Problem List). Pt will benefit from acute skilled PT to increase their independence and safety with mobility to allow discharge.       If plan is discharge home, recommend the following: A lot of help with walking and/or transfers;A lot of help with bathing/dressing/bathroom;Assistance with cooking/housework;Assist for transportation;Help with stairs or ramp for entrance   Can travel by private vehicle     No  Equipment Recommendations  None recommended by PT    Recommendations for Other Services OT consult     Precautions / Restrictions Precautions Precautions: Fall Recall of Precautions/Restrictions: Impaired Precaution/Restrictions Comments: Continued offloading from R BKA prosthesis until R posterior knee wound heals Restrictions Weight Bearing Restrictions Per Provider Order: No     Mobility  Bed Mobility Overal bed mobility: Modified Independent Bed Mobility: Supine to Sit, Sit to Supine     Supine to sit: Supervision, HOB elevated, Used rails Sit to supine: Supervision, HOB elevated   General bed mobility comments: Good use of core, reliant on rails, but no  assist    Transfers Overall transfer level: Needs assistance Equipment used: 1 person hand held assist Transfers: Sit to/from Stand Sit to Stand: From elevated surface, Min assist           General transfer comment: From elevated surface with use of rail to pull to stand, min assist to come to stand and maintain balance. Difficult to achieve full upright stance and decr endurance to stand very long    Ambulation/Gait                   Stairs             Wheelchair Mobility     Tilt Bed    Modified Rankin (Stroke Patients Only)       Balance Overall balance assessment: Needs assistance Sitting-balance support: No upper extremity supported, Feet supported Sitting balance-Leahy Scale: Fair     Standing balance support: Bilateral upper extremity supported, During functional activity Standing balance-Leahy Scale: Poor Standing balance comment: reliant on BUE suupport                            Communication Communication Communication: No apparent difficulties  Cognition Arousal: Alert Behavior During Therapy: WFL for tasks assessed/performed   PT - Cognitive impairments: No apparent impairments                       PT - Cognition Comments: Pt A,Ox4 Following commands: Intact      Cueing Cueing Techniques: Verbal cues  Exercises Other Exercises Other Exercises: Access Code: [email or web address]  URL:  [email or web address]  Date: 09/11/2024  Prepared by: Stephane    Exercises  - Supine Quad Set  - 3 x daily - 7 x weekly - 2 sets - 10 reps - 5 hold  - Active Straight Leg Raise with Quad Set  - 3 x daily - 7 x weekly - 2 sets - 10 reps - 5 hold  - Supine Hamstring Stretch with Strap  - 3 x daily - 7 x weekly - 2 sets - 10 reps - 5 hold  - Sidelying hip abduction  - 3 x daily - 7 x weekly - 2 sets - 10 reps - 5 hold  - Seated Long Arc Quad  - 3 x daily - 7 x weekly - 2 sets - 10 reps - 5 hold  - Seated March  - 3 x daily - 7 x weekly -  2 sets - 10 reps - 5 hold Other Exercises: Educated pt on exercises above and pt received handout. Gave pt gait belt to use with stretches.    General Comments General comments (skin integrity, edema, etc.): VSS on RA      Pertinent Vitals/Pain Pain Assessment Pain Assessment: Faces Faces Pain Scale: Hurts little more Pain Location: R Knee Pain Descriptors / Indicators: Burning, Grimacing Pain Intervention(s): Limited activity within patient's tolerance, Monitored during session, Repositioned    Home Living                          Prior Function            PT Goals (current goals can now be found in the care plan section) Acute Rehab PT Goals Patient Stated Goal: Regain independence and be able to use prosthesis again Progress towards PT goals: Progressing toward goals    Frequency    Min 2X/week      PT Plan      Co-evaluation              AM-PAC PT 6 Clicks Mobility   Outcome Measure  Help needed turning from your back to your side while in a flat bed without using bedrails?: A Little Help needed moving from lying on your back to sitting on the side of a flat bed without using bedrails?: A Little Help needed moving to and from a bed to a chair (including a wheelchair)?: A Lot Help needed standing up from a chair using your arms (e.g., wheelchair or bedside chair)?: A Little Help needed to walk in hospital room?: A Lot Help needed climbing 3-5 steps with a railing? : Total 6 Click Score: 14    End of Session Equipment Utilized During Treatment: Gait belt Activity Tolerance: Patient tolerated treatment well Patient left: in bed;with call bell/phone within reach Nurse Communication: Mobility status PT Visit Diagnosis: Difficulty in walking, not elsewhere classified (R26.2);Other abnormalities of gait and mobility (R26.89);Unsteadiness on feet (R26.81);Pain Pain - Right/Left: Right Pain - part of body: Knee     Time: 8870-8843 PT Time  Calculation (min) (ACUTE ONLY): 27 min  Charges:    $Therapeutic Exercise: 8-22 mins $Therapeutic Activity: 8-22 mins PT General Charges $$ ACUTE PT VISIT: 1 Visit                     Elif Yonts M,PT Acute Rehab Services [phone]    Stephane JULIANNA Bevel 09/11/2024, 1:24 PM

## 2024-09-18 DIAGNOSIS — L97812 Non-pressure chronic ulcer of other part of right lower leg with fat layer exposed: Secondary | ICD-10-CM | POA: Diagnosis not present

## 2024-09-18 DIAGNOSIS — I739 Peripheral vascular disease, unspecified: Secondary | ICD-10-CM | POA: Diagnosis not present

## 2024-09-18 DIAGNOSIS — M6281 Muscle weakness (generalized): Secondary | ICD-10-CM | POA: Diagnosis not present

## 2024-09-18 DIAGNOSIS — E1165 Type 2 diabetes mellitus with hyperglycemia: Secondary | ICD-10-CM | POA: Diagnosis not present

## 2024-09-22 ENCOUNTER — Telehealth: Payer: Self-pay

## 2024-09-22 NOTE — Telephone Encounter (Signed)
-----   Message from [facility] [place] R sent at 08/29/2024  9:09 AM EDT ----- Regarding: FW: November2025  ----- Message ----- From: Benjamine Nat DEL, CMA Sent: 08/28/2024  12:00 AM EDT To: Nat DEL Benjamine, CMA Subject: [id number]                                   Pt needs MRI pancreas in NOV per Dr Legrand dx pancreatic cysts

## 2024-09-22 NOTE — Telephone Encounter (Signed)
 Dr Legrand I got a note about scheduling this patient for an MRI pancreas in November from back in May but this patient did have in July. Does this patient needs to have MRI in November?    LVM for patient to see about a day of the week that is good for him when it comes to scheduling an MRI

## 2024-09-24 NOTE — Telephone Encounter (Signed)
 Please move the recall for MRI pancreas with and without contrast to February 2026  - HD

## 2024-09-25 ENCOUNTER — Ambulatory Visit: Admitting: Physician Assistant

## 2024-09-25 ENCOUNTER — Encounter: Payer: Self-pay | Admitting: Radiology

## 2024-09-25 NOTE — Telephone Encounter (Signed)
 Recall placed for February 2026

## 2024-09-25 NOTE — Progress Notes (Deleted)
 Office Visit Note   Patient: [name] [name]           Date of Birth: [birth year]           MRN: [id number] Visit Date: 09/25/2024              Requested by: Adele Song, MD [address] [place],  [place] [address] PCP: Adele Song, MD  No chief complaint on file.     HPI: 53 y/o male with history of right BKA underwent Excisional debridement abscess popliteal fossa right knee with excision skin soft tissue muscle fascia and pseudocyst.  He was discharged to a SNF post op.  On his last visit he had been discharged home.  He was suppose to have [facility] PT and had not heard from them.  If he did hear from them he was going to call the Care manger from the SNF for information. On his last office visit 09/04/24 the wound was measuring  2 cm x 3 cm with a depth 0.5 cm.  He will continue to pack wet to dry dressing with Vashe daily.    He felt weaker and unable to care for himself and reported to the ED.  The plan was to re admit him to a SNF for care.    Assessment & Plan: Visit Diagnoses: No diagnosis found.  Plan: ***  Follow-Up Instructions: No follow-ups on file.   Ortho Exam  Patient is alert, oriented, no adenopathy, well-dressed, normal affect, normal respiratory effort. ***    Imaging: No results found. No images are attached to the encounter.  Labs: Lab Results  Component Value Date   HGBA1C 7.3 (H) 06/09/2024   HGBA1C 6.1 (H) 10/13/2023   HGBA1C 6.6 05/18/2023   ESRSEDRATE 68 (H) 04/10/2021   ESRSEDRATE 75 (H) 10/20/2017   CRP 1.9 (H) 10/20/2017   REPTSTATUS 08/01/2024 FINAL 07/29/2024   GRAMSTAIN  07/29/2024    ABUNDANT WBC PRESENT, PREDOMINANTLY PMN ABUNDANT GRAM NEGATIVE RODS ABUNDANT GRAM POSITIVE COCCI    CULT  07/29/2024    NO GROWTH 3 DAYS Performed at [facility], [address], [place], [place] [address]    [facility] ENTEROCOCCUS MARIJO 05/17/2024     Lab Results  Component Value Date   ALBUMIN  2.5 (L) 09/09/2024   ALBUMIN  2.6 (L)  09/08/2024   ALBUMIN  3.0 (L) 09/05/2024    Lab Results  Component Value Date   MG 1.8 02/12/2023   MG 1.7 09/02/2021   MG 2.0 10/13/2017   Lab Results  Component Value Date   VD25OH 10.22 (L) 10/14/2023    No results found for: PREALBUMIN    Latest Ref Rng & Units 09/09/2024   10:45 PM 09/08/2024    3:20 PM 09/05/2024    1:12 PM  CBC EXTENDED  WBC 4.0 - 10.5 K/uL 8.1  6.8  8.2   RBC 4.22 - 5.81 MIL/uL 3.68  3.67  4.01   Hemoglobin 13.0 - 17.0 g/dL 8.5  8.3  9.0   HCT 60.9 - 52.0 % 28.5  28.5  31.0   Platelets 150 - 400 K/uL 116  119  169      There is no height or weight on file to calculate BMI.  Orders:  No orders of the defined types were placed in this encounter.  No orders of the defined types were placed in this encounter.    Procedures: No procedures performed  Clinical Data: No additional findings.  ROS:  All other  systems negative, except as noted in the HPI. Review of Systems  Objective: Vital Signs: There were no vitals taken for this visit.  Specialty Comments:  No specialty comments available.  PMFS History: Patient Active Problem List   Diagnosis Date Noted   Mechanical complication of dialysis catheter 07/29/2024   Abscess of right lower extremity 07/29/2024   Chronic health problem 07/29/2024   ESRD on hemodialysis (HCC) 07/29/2024   Anemia of chronic disease 07/29/2024   Nausea and vomiting 01/26/2024   A-V fistula 01/25/2024   Depression 01/24/2024   Nonimmune to hepatitis B virus 10/22/2023   Urinary retention 10/14/2023   Closed intertrochanteric fracture of right hip (HCC) 10/13/2023   Contracture, 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   Pleural effusion 02/16/2023   Allergic rhinitis 11/07/2022   Tobacco dependence 09/11/2022   ESRD (end stage renal disease) on dialysis (HCC) 09/11/2022   GERD with esophagitis 09/02/2021   Acute kidney injury superimposed on CKD 04/10/2021   Achilles tendon contracture, left 03/03/2018   Non-pressure  chronic ulcer of other part of left foot limited to breakdown of skin (HCC) 02/02/2018   History of right below knee amputation (HCC) 12/22/2017   Acute posthemorrhagic anemia    Falls 11/24/2017   Severe hypertension 11/24/2017   Anemia 11/24/2017   Uncontrolled type 2 diabetes mellitus with hyperglycemia, with long-term current use of insulin  (HCC) 10/11/2017   ARF (acute renal failure) 10/11/2017   Past Medical History:  Diagnosis Date   Abdominal pain 11/30/2023   Abdominal tenderness 10/14/2023   Allergy    seasonal and environmental   Anemia    hx blood transfusion 04/27/24   Chest pain 12/01/2023   Chronic constipation 05/13/2020   Depression    Diabetes mellitus without complication (HCC) 2019   Dyspnea    hx - SOB with exertion   ESRD on hemodialysis (HCC)    Tues, Thurs, Sat   GERD (gastroesophageal reflux disease)    Hyperlipidemia    Hypertension    Hypertensive urgency 01/24/2024   Hypotension 12/01/2023   Necrotizing fasciitis (HCC) 10/13/2017   Neuromuscular disorder (HCC)    neuropathy   PAD (peripheral artery disease)    Patellar tendinitis 10/12/2023   Secondary hyperparathyroidism    SVT (supraventricular tachycardia)    hx   Wound dehiscence 11/24/2017    Family History  Problem Relation Age of Onset   Diabetes Mellitus II Mother    Depression Mother    Colon polyps Mother    Hypertension Mother    High Cholesterol Mother    Heart disease Father        CABG x 4.  04/2017   Diabetes Father    High Cholesterol Father    Hypertension Father    Heart disease Maternal Grandmother    Heart disease Maternal Grandfather    Colon cancer Neg Hx    Esophageal cancer Neg Hx    Liver cancer Neg Hx    Pancreatic cancer Neg Hx    Rectal cancer Neg Hx    Stomach cancer Neg Hx     Past Surgical History:  Procedure Laterality Date   A/V FISTULAGRAM N/A 03/17/2024   Procedure: A/V Fistulagram;  Surgeon: Magda Debby SAILOR, MD;  [facility];   Service: Cardiovascular;  Laterality: N/A;   AMPUTATION Right 10/13/2017   Procedure: RIGHT BELOW KNEE AMPUTATION;  Surgeon: Harden Jerona GAILS, MD;  Location: [facility] OR;  Service: Orthopedics;  Laterality: Right;   AMPUTATION Right 11/24/2017   Procedure:  AMPUTATION BELOW KNEE REVISION;  Surgeon: Harden Jerona GAILS, MD;  Location: [facility] OR;  Service: Orthopedics;  Laterality: Right;   AMPUTATION TOE Left    AV FISTULA PLACEMENT Left 10/19/2023   Procedure: LEFT BRACHIOCEPHALIC ARTERIOVENOUS (AV) FISTULA CREATION;  Surgeon: Pearline Norman RAMAN, MD;  Location: [facility] OR;  Service: Vascular;  Laterality: Left;   AV FISTULA PLACEMENT Left 01/26/2024   Procedure: LEFT ARTERIOVENOUS (AV) FISTULA CREATION;  Surgeon: Pearline Norman RAMAN, MD;  Location: [facility] OR;  Service: Vascular;  Laterality: Left;   BASCILIC VEIN TRANSPOSITION Left 04/26/2024   Procedure: LEFT SECOND STAGE TRANSPOSITION, VEIN, BASILIC;  Surgeon: Magda Debby SAILOR, MD;  Location: [facility] OR;  Service: Vascular;  Laterality: Left;   BIOPSY  09/02/2021   Procedure: BIOPSY;  Surgeon: Federico Rosario BROCKS, MD;  Location: [facility] ENDOSCOPY;  Service: Gastroenterology;;   BIOPSY  02/18/2023   Procedure: BIOPSY;  Surgeon: Leigh Elspeth SQUIBB, MD;  Location: [facility] ENDOSCOPY;  Service: Gastroenterology;;   COLONOSCOPY  05/11/2019   COLONOSCOPY  2021   3 polyps - tubular adenomas   ESOPHAGOGASTRODUODENOSCOPY (EGD) WITH PROPOFOL  N/A 09/02/2021   Procedure: ESOPHAGOGASTRODUODENOSCOPY (EGD) WITH PROPOFOL ;  Surgeon: Federico Rosario BROCKS, MD;  Location: [facility] ENDOSCOPY;  Service: Gastroenterology;  Laterality: N/A;   ESOPHAGOGASTRODUODENOSCOPY (EGD) WITH PROPOFOL  N/A 02/18/2023   Procedure: ESOPHAGOGASTRODUODENOSCOPY (EGD) WITH PROPOFOL ;  Surgeon: Leigh Elspeth SQUIBB, MD;  Location: [facility] ENDOSCOPY;  Service: Gastroenterology;  Laterality: N/A;   INCISION AND DRAINAGE OF WOUND Left 05/17/2024   Procedure: IRRIGATION AND DEBRIDEMENT WOUND;  Surgeon: Serene Gaile ORN, MD;  Location: [facility] OR;  Service: Vascular;   Laterality: Left;  Arm   INTRAMEDULLARY (IM) NAIL INTERTROCHANTERIC Right 10/15/2023   Procedure: INTRAMEDULLARY (IM) NAIL INTERTROCHANTERIC;  Surgeon: Kendal Franky SQUIBB, MD;  Location: [facility] OR;  Service: Orthopedics;  Laterality: Right;   IR FLUORO GUIDE CV LINE RIGHT  10/15/2023   IR TUNNELED CENTRAL VENOUS CATH PLC W IMG  07/31/2024   IR US  GUIDE VASC ACCESS RIGHT  10/15/2023   IRRIGATION AND DEBRIDEMENT KNEE Right 08/04/2024   Procedure: IRRIGATION AND DEBRIDEMENT KNEE, RIGHT;  Surgeon: Harden Jerona GAILS, MD;  Location: [facility] OR;  Service: Orthopedics;  Laterality: Right;  Debridement Right Popliteal Fossa Abscess   POLYPECTOMY     WISDOM TOOTH EXTRACTION     Social History   Occupational History   Occupation: unemployed  Tobacco Use   Smoking status: Every Day    Current packs/day: 0.50    Average packs/day: 0.5 packs/day for 36.3 years (18.0 ttl pk-yrs)    Types: Cigarettes    Start date: 11/23/1988   Smokeless tobacco: Never   Tobacco comments:    6 cigarettes daily- khj 05/24/2024        Started smoking at 53 years old    Smoked 1PPD at his heaviest  Vaping Use   Vaping status: Never Used  Substance and Sexual Activity   Alcohol use: No   Drug use: No   Sexual activity: Not Currently

## 2024-09-27 ENCOUNTER — Ambulatory Visit: Admitting: Physician Assistant

## 2024-09-27 ENCOUNTER — Encounter: Payer: Self-pay | Admitting: Physician Assistant

## 2024-09-27 DIAGNOSIS — L97811 Non-pressure chronic ulcer of other part of right lower leg limited to breakdown of skin: Secondary | ICD-10-CM

## 2024-09-27 NOTE — Progress Notes (Signed)
 Office Visit Note   Patient: [name] [name]           Date of Birth: December 27, 1970           MRN: [id number] Visit Date: 09/27/2024              Requested by: Adele Song, MD [address] [place],  [place] [address] PCP: Adele Song, MD  Chief Complaint  Patient presents with   Right Knee - Routine Post Op    08/04/2024 I&D right knee      HPI: 53 y/o male with history of right BKA underwent Excisional debridement abscess popliteal fossa right knee with excision skin soft tissue muscle fascia and pseudocyst.  He was last seen in our office on 09/04/24.  He was worried that he would not be able to care for himself at the time of the visit due to the fact that he could not wear his prosthesis.  He lives in a mobile home that has very narrow doorways and he has not heard from [facility].      On 09/05/24 he reported to the ED.  He was managed for several days and then discharged to SNF on 09/11/24.      Assessment & Plan: Visit Diagnoses:  1. Skin ulcer of right knee, limited to breakdown of skin (HCC)     Plan: Vashe wet to dry  daily and peri wound cleaning. Ace wrap for compression over the black stump sock. Elevation.    Follow-Up Instructions: Return in about 12 days (around 10/09/2024).   Ortho Exam  Patient is alert, oriented, no adenopathy, well-dressed, normal affect, normal respiratory effort. 100% granulation tissue at the wound base, 2 cm x 2 cm.  No cellulitis or active drainage.  The right BKA stump has mild non pitting edema, warm to touch.     Left knee contracture 10 degrees.  He transfers using a Hoyer lift currently and is working with PT.      Imaging: No results found. No images are attached to the encounter.  Labs: Lab Results  Component Value Date   HGBA1C 7.3 (H) 06/09/2024   HGBA1C 6.1 (H) 10/13/2023   HGBA1C 6.6 05/18/2023   ESRSEDRATE 68 (H) 04/10/2021   ESRSEDRATE 75 (H) 10/20/2017   CRP 1.9 (H) 10/20/2017   REPTSTATUS 08/01/2024 FINAL  07/29/2024   GRAMSTAIN  07/29/2024    ABUNDANT WBC PRESENT, PREDOMINANTLY PMN ABUNDANT GRAM NEGATIVE RODS ABUNDANT GRAM POSITIVE COCCI    CULT  07/29/2024    NO GROWTH 3 DAYS Performed at [facility], [address] [place], [place] [address]    [facility] ENTEROCOCCUS MARIJO 05/17/2024     Lab Results  Component Value Date   ALBUMIN  2.5 (L) 09/09/2024   ALBUMIN  2.6 (L) 09/08/2024   ALBUMIN  3.0 (L) 09/05/2024    Lab Results  Component Value Date   MG 1.8 02/12/2023   MG 1.7 09/02/2021   MG 2.0 10/13/2017   Lab Results  Component Value Date   VD25OH 10.22 (L) 10/14/2023    No results found for: PREALBUMIN    Latest Ref Rng & Units 09/09/2024   10:45 PM 09/08/2024    3:20 PM 09/05/2024    1:12 PM  CBC EXTENDED  WBC 4.0 - 10.5 K/uL 8.1  6.8  8.2   RBC 4.22 - 5.81 MIL/uL 3.68  3.67  4.01   Hemoglobin 13.0 - 17.0 g/dL 8.5  8.3  9.0   HCT 60.9 -  52.0 % 28.5  28.5  31.0   Platelets 150 - 400 K/uL 116  119  169      There is no height or weight on file to calculate BMI.  Orders:  No orders of the defined types were placed in this encounter.  No orders of the defined types were placed in this encounter.    Procedures: No procedures performed  Clinical Data: No additional findings.  ROS:  All other systems negative, except as noted in the HPI. Review of Systems  Objective: Vital Signs: There were no vitals taken for this visit.  Specialty Comments:  No specialty comments available.  PMFS History: Patient Active Problem List   Diagnosis Date Noted   Mechanical complication of dialysis catheter 07/29/2024   Abscess of right lower extremity 07/29/2024   Chronic health problem 07/29/2024   ESRD on hemodialysis (HCC) 07/29/2024   Anemia of chronic disease 07/29/2024   Nausea and vomiting 01/26/2024   A-V fistula 01/25/2024   Depression 01/24/2024   Nonimmune to hepatitis B virus 10/22/2023   Urinary retention 10/14/2023   Closed  intertrochanteric fracture of right hip (HCC) 10/13/2023   Contracture, 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   Pleural effusion 02/16/2023   Allergic rhinitis 11/07/2022   Tobacco dependence 09/11/2022   ESRD (end stage renal disease) on dialysis (HCC) 09/11/2022   GERD with esophagitis 09/02/2021   Acute kidney injury superimposed on CKD 04/10/2021   Achilles tendon contracture, left 03/03/2018   Non-pressure chronic ulcer of other part of left foot limited to breakdown of skin (HCC) 02/02/2018   History of right below knee amputation (HCC) 12/22/2017   Acute posthemorrhagic anemia    Falls 11/24/2017   Severe hypertension 11/24/2017   Anemia 11/24/2017   Uncontrolled type 2 diabetes mellitus with hyperglycemia, with long-term current use of insulin  (HCC) 10/11/2017   ARF (acute renal failure) 10/11/2017   Past Medical History:  Diagnosis Date   Abdominal pain 11/30/2023   Abdominal tenderness 10/14/2023   Allergy    seasonal and environmental   Anemia    hx blood transfusion 04/27/24   Chest pain 12/01/2023   Chronic constipation 05/13/2020   Depression    Diabetes mellitus without complication (HCC) 2019   Dyspnea    hx - SOB with exertion   ESRD on hemodialysis (HCC)    Tues, Thurs, Sat   GERD (gastroesophageal reflux disease)    Hyperlipidemia    Hypertension    Hypertensive urgency 01/24/2024   Hypotension 12/01/2023   Necrotizing fasciitis (HCC) 10/13/2017   Neuromuscular disorder (HCC)    neuropathy   PAD (peripheral artery disease)    Patellar tendinitis 10/12/2023   Secondary hyperparathyroidism    SVT (supraventricular tachycardia)    hx   Wound dehiscence 11/24/2017    Family History  Problem Relation Age of Onset   Diabetes Mellitus II Mother    Depression Mother    Colon polyps Mother    Hypertension Mother    High Cholesterol Mother    Heart disease Father        CABG x 4.  04/2017   Diabetes Father    High Cholesterol Father    Hypertension Father    Heart  disease Maternal Grandmother    Heart disease Maternal Grandfather    Colon cancer Neg Hx    Esophageal cancer Neg Hx    Liver cancer Neg Hx    Pancreatic cancer Neg Hx    Rectal cancer Neg Hx    Stomach  cancer Neg Hx     Past Surgical History:  Procedure Laterality Date   A/V FISTULAGRAM N/A 03/17/2024   Procedure: A/V Fistulagram;  Surgeon: Magda Debby SAILOR, MD;  [facility]: [facility];  Service: Cardiovascular;  Laterality: N/A;   AMPUTATION Right 10/13/2017   Procedure: RIGHT BELOW KNEE AMPUTATION;  Surgeon: Harden Jerona GAILS, MD;  Location: [facility] OR;  Service: Orthopedics;  Laterality: Right;   AMPUTATION Right 11/24/2017   Procedure: AMPUTATION BELOW KNEE REVISION;  Surgeon: Harden Jerona GAILS, MD;  Location: [facility] OR;  Service: Orthopedics;  Laterality: Right;   AMPUTATION TOE Left    AV FISTULA PLACEMENT Left 10/19/2023   Procedure: LEFT BRACHIOCEPHALIC ARTERIOVENOUS (AV) FISTULA CREATION;  Surgeon: Pearline Norman RAMAN, MD;  Location: [facility] OR;  Service: Vascular;  Laterality: Left;   AV FISTULA PLACEMENT Left 01/26/2024   Procedure: LEFT ARTERIOVENOUS (AV) FISTULA CREATION;  Surgeon: Pearline Norman RAMAN, MD;  Location: [facility] OR;  Service: Vascular;  Laterality: Left;   BASCILIC VEIN TRANSPOSITION Left 04/26/2024   Procedure: LEFT SECOND STAGE TRANSPOSITION, VEIN, BASILIC;  Surgeon: Magda Debby SAILOR, MD;  Location: [facility] OR;  Service: Vascular;  Laterality: Left;   BIOPSY  09/02/2021   Procedure: BIOPSY;  Surgeon: Federico Rosario BROCKS, MD;  Location: [facility] ENDOSCOPY;  Service: Gastroenterology;;   BIOPSY  02/18/2023   Procedure: BIOPSY;  Surgeon: Leigh Elspeth SQUIBB, MD;  Location: [facility] ENDOSCOPY;  Service: Gastroenterology;;   COLONOSCOPY  05/11/2019   COLONOSCOPY  2021   3 polyps - tubular adenomas   ESOPHAGOGASTRODUODENOSCOPY (EGD) WITH PROPOFOL  N/A 09/02/2021   Procedure: ESOPHAGOGASTRODUODENOSCOPY (EGD) WITH PROPOFOL ;  Surgeon: Federico Rosario BROCKS, MD;  Location: [facility] ENDOSCOPY;  Service: Gastroenterology;   Laterality: N/A;   ESOPHAGOGASTRODUODENOSCOPY (EGD) WITH PROPOFOL  N/A 02/18/2023   Procedure: ESOPHAGOGASTRODUODENOSCOPY (EGD) WITH PROPOFOL ;  Surgeon: Leigh Elspeth SQUIBB, MD;  Location: [facility] ENDOSCOPY;  Service: Gastroenterology;  Laterality: N/A;   INCISION AND DRAINAGE OF WOUND Left 05/17/2024   Procedure: IRRIGATION AND DEBRIDEMENT WOUND;  Surgeon: Serene Gaile ORN, MD;  Location: [facility] OR;  Service: Vascular;  Laterality: Left;  Arm   INTRAMEDULLARY (IM) NAIL INTERTROCHANTERIC Right 10/15/2023   Procedure: INTRAMEDULLARY (IM) NAIL INTERTROCHANTERIC;  Surgeon: Kendal Franky SQUIBB, MD;  Location: [facility] OR;  Service: Orthopedics;  Laterality: Right;   IR FLUORO GUIDE CV LINE RIGHT  10/15/2023   IR TUNNELED CENTRAL VENOUS CATH PLC W IMG  07/31/2024   IR US  GUIDE VASC ACCESS RIGHT  10/15/2023   IRRIGATION AND DEBRIDEMENT KNEE Right 08/04/2024   Procedure: IRRIGATION AND DEBRIDEMENT KNEE, RIGHT;  Surgeon: Harden Jerona GAILS, MD;  Location: [facility] OR;  Service: Orthopedics;  Laterality: Right;  Debridement Right Popliteal Fossa Abscess   POLYPECTOMY     WISDOM TOOTH EXTRACTION     Social History   Occupational History   Occupation: unemployed  Tobacco Use   Smoking status: Every Day    Current packs/day: 0.50    Average packs/day: 0.5 packs/day for 36.3 years (18.0 ttl pk-yrs)    Types: Cigarettes    Start date: 11/23/1988   Smokeless tobacco: Never   Tobacco comments:    6 cigarettes daily- khj 05/24/2024        Started smoking at 53 years old    Smoked 1PPD at his heaviest  Vaping Use   Vaping status: Never Used  Substance and Sexual Activity   Alcohol use: No   Drug use: No   Sexual activity: Not Currently

## 2024-10-09 ENCOUNTER — Telehealth: Payer: Self-pay

## 2024-10-09 ENCOUNTER — Ambulatory Visit: Admitting: Physician Assistant

## 2024-10-09 NOTE — Telephone Encounter (Signed)
 Attempted to reach patient to confirm if he uses [facility] as one of his pharmacies. LVM. Nelson Land, CMA

## 2024-10-10 NOTE — Telephone Encounter (Signed)
 2nd attempt to reach out to confirm if patient uses [facility] or not.  No answer. LVM for patient to call the office to let us  know. .Lainy Wrobleski, CMA

## 2024-10-12 NOTE — Telephone Encounter (Signed)
 Spoke with patient to see if he uses [facility] as one of his pharmacies he stated that he does not use [facility]. He only uses [facility] on [address]

## 2024-10-13 ENCOUNTER — Ambulatory Visit (INDEPENDENT_AMBULATORY_CARE_PROVIDER_SITE_OTHER): Admitting: Physician Assistant

## 2024-10-13 ENCOUNTER — Encounter: Payer: Self-pay | Admitting: Physician Assistant

## 2024-10-13 DIAGNOSIS — L97811 Non-pressure chronic ulcer of other part of right lower leg limited to breakdown of skin: Secondary | ICD-10-CM

## 2024-10-13 NOTE — Progress Notes (Signed)
 Office Visit Note   Patient: [name]           Date of Birth: 03/10/1971           MRN: [id number] Visit Date: 10/13/2024              Requested by: Adele Song, MD [address] [name],  [place] [address] PCP: Adele Song, MD  Chief Complaint  Patient presents with   Right Knee - Routine Post Op    08/04/2024 I&D right knee       HPI: 53 y/o male with history of right BKA underwent Excisional debridement abscess popliteal fossa right knee with excision skin soft tissue muscle fascia and pseudocyst.  He is still residing at a SNF.  His sister is here with him and she also looks after their mother.    Assessment & Plan: Visit Diagnoses:  1. Skin ulcer of right knee, limited to breakdown of skin (HCC)     Plan: The wound appears to be healing well and much smaller.  Silver nitrate was placed on the hypergranulation to control over growth past the skin.  Continue Vashe wet to dry dressings daily.  He has an appointment with hanger for prosthetic liner.  The back of the prosthesis does not seem to be in contact with the wound.  He has PT at the SNF so I will order PT gait training there.    Follow-Up Instructions: Return in about 4 weeks (around 11/10/2024).   Ortho Exam  Patient is alert, oriented, no adenopathy, well-dressed, normal affect, normal respiratory effort. The posterior knee wound has healthy hypergranulation it measures 1 cm x 4 mm.  No cellulitis or edema. The stump is warm and well perfused.       Imaging: No results found. No images are attached to the encounter.  Labs: Lab Results  Component Value Date   HGBA1C 7.3 (H) 06/09/2024   HGBA1C 6.1 (H) 10/13/2023   HGBA1C 6.6 05/18/2023   ESRSEDRATE 68 (H) 04/10/2021   ESRSEDRATE 75 (H) 10/20/2017   CRP 1.9 (H) 10/20/2017   REPTSTATUS 08/01/2024 FINAL 07/29/2024   GRAMSTAIN  07/29/2024    ABUNDANT WBC PRESENT, PREDOMINANTLY PMN ABUNDANT GRAM NEGATIVE RODS ABUNDANT GRAM POSITIVE COCCI     CULT  07/29/2024    NO GROWTH 3 DAYS Performed at [facility], [address] [place], [place] [address]    [facility] ENTEROCOCCUS MARIJO 05/17/2024     Lab Results  Component Value Date   ALBUMIN  2.5 (L) 09/09/2024   ALBUMIN  2.6 (L) 09/08/2024   ALBUMIN  3.0 (L) 09/05/2024    Lab Results  Component Value Date   MG 1.8 02/12/2023   MG 1.7 09/02/2021   MG 2.0 10/13/2017   Lab Results  Component Value Date   VD25OH 10.22 (L) 10/14/2023    No results found for: PREALBUMIN    Latest Ref Rng & Units 09/09/2024   10:45 PM 09/08/2024    3:20 PM 09/05/2024    1:12 PM  CBC EXTENDED  WBC 4.0 - 10.5 K/uL 8.1  6.8  8.2   RBC 4.22 - 5.81 MIL/uL 3.68  3.67  4.01   Hemoglobin 13.0 - 17.0 g/dL 8.5  8.3  9.0   HCT 60.9 - 52.0 % 28.5  28.5  31.0   Platelets 150 - 400 K/uL 116  119  169      There is no height or weight on file to calculate BMI.  Orders:  No orders of the defined types were placed in this encounter.  No orders of the defined types were placed in this encounter.    Procedures: No procedures performed  Clinical Data: No additional findings.  ROS:  All other systems negative, except as noted in the HPI. Review of Systems  Objective: Vital Signs: There were no vitals taken for this visit.  Specialty Comments:  No specialty comments available.  PMFS History: Patient Active Problem List   Diagnosis Date Noted   Mechanical complication of dialysis catheter 07/29/2024   Abscess of right lower extremity 07/29/2024   Chronic health problem 07/29/2024   ESRD on hemodialysis (HCC) 07/29/2024   Anemia of chronic disease 07/29/2024   Nausea and vomiting 01/26/2024   A-V fistula 01/25/2024   Depression 01/24/2024   Nonimmune to hepatitis B virus 10/22/2023   Urinary retention 10/14/2023   Closed intertrochanteric fracture of right hip (HCC) 10/13/2023   Contracture, 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   Pleural effusion 02/16/2023   Allergic rhinitis  11/07/2022   Tobacco dependence 09/11/2022   ESRD (end stage renal disease) on dialysis (HCC) 09/11/2022   GERD with esophagitis 09/02/2021   Acute kidney injury superimposed on CKD 04/10/2021   Achilles tendon contracture, left 03/03/2018   Non-pressure chronic ulcer of other part of left foot limited to breakdown of skin (HCC) 02/02/2018   History of right below knee amputation (HCC) 12/22/2017   Acute posthemorrhagic anemia    Falls 11/24/2017   Severe hypertension 11/24/2017   Anemia 11/24/2017   Uncontrolled type 2 diabetes mellitus with hyperglycemia, with long-term current use of insulin  (HCC) 10/11/2017   ARF (acute renal failure) 10/11/2017   Past Medical History:  Diagnosis Date   Abdominal pain 11/30/2023   Abdominal tenderness 10/14/2023   Allergy    seasonal and environmental   Anemia    hx blood transfusion 04/27/24   Chest pain 12/01/2023   Chronic constipation 05/13/2020   Depression    Diabetes mellitus without complication (HCC) 2019   Dyspnea    hx - SOB with exertion   ESRD on hemodialysis (HCC)    Tues, Thurs, Sat   GERD (gastroesophageal reflux disease)    Hyperlipidemia    Hypertension    Hypertensive urgency 01/24/2024   Hypotension 12/01/2023   Necrotizing fasciitis (HCC) 10/13/2017   Neuromuscular disorder (HCC)    neuropathy   PAD (peripheral artery disease)    Patellar tendinitis 10/12/2023   Secondary hyperparathyroidism    SVT (supraventricular tachycardia)    hx   Wound dehiscence 11/24/2017    Family History  Problem Relation Age of Onset   Diabetes Mellitus II Mother    Depression Mother    Colon polyps Mother    Hypertension Mother    High Cholesterol Mother    Heart disease Father        CABG x 4.  04/2017   Diabetes Father    High Cholesterol Father    Hypertension Father    Heart disease Maternal Grandmother    Heart disease Maternal Grandfather    Colon cancer Neg Hx    Esophageal cancer Neg Hx    Liver cancer Neg Hx     Pancreatic cancer Neg Hx    Rectal cancer Neg Hx    Stomach cancer Neg Hx     Past Surgical History:  Procedure Laterality Date   A/V FISTULAGRAM N/A 03/17/2024   Procedure: A/V Fistulagram;  Surgeon: Magda Debby SAILOR, MD;  Location: [facility] INVAS[facility] LAB;  Service: Cardiovascular;  Laterality: N/A;   AMPUTATION Right 10/13/2017   Procedure: RIGHT BELOW KNEE AMPUTATION;  Surgeon: Harden Jerona GAILS, MD;  Location: [facility] OR;  Service: Orthopedics;  Laterality: Right;   AMPUTATION Right 11/24/2017   Procedure: AMPUTATION BELOW KNEE REVISION;  Surgeon: Harden Jerona GAILS, MD;  Location: [facility] OR;  Service: Orthopedics;  Laterality: Right;   AMPUTATION TOE Left    AV FISTULA PLACEMENT Left 10/19/2023   Procedure: LEFT BRACHIOCEPHALIC ARTERIOVENOUS (AV) FISTULA CREATION;  Surgeon: Pearline Norman RAMAN, MD;  Location: [facility] OR;  Service: Vascular;  Laterality: Left;   AV FISTULA PLACEMENT Left 01/26/2024   Procedure: LEFT ARTERIOVENOUS (AV) FISTULA CREATION;  Surgeon: Pearline Norman RAMAN, MD;  Location: [facility] OR;  Service: Vascular;  Laterality: Left;   BASCILIC VEIN TRANSPOSITION Left 04/26/2024   Procedure: LEFT SECOND STAGE TRANSPOSITION, VEIN, BASILIC;  Surgeon: Magda Debby SAILOR, MD;  Location: [name] OR;  Service: Vascular;  Laterality: Left;   BIOPSY  09/02/2021   Procedure: BIOPSY;  Surgeon: Federico Rosario BROCKS, MD;  Location: [facility] ENDOSCOPY;  Service: Gastroenterology;;   BIOPSY  02/18/2023   Procedure: BIOPSY;  Surgeon: Leigh Elspeth SQUIBB, MD;  Location: [facility] ENDOSCOPY;  Service: Gastroenterology;;   COLONOSCOPY  05/11/2019   COLONOSCOPY  2021   3 polyps - tubular adenomas   ESOPHAGOGASTRODUODENOSCOPY (EGD) WITH PROPOFOL  N/A 09/02/2021   Procedure: ESOPHAGOGASTRODUODENOSCOPY (EGD) WITH PROPOFOL ;  Surgeon: Federico Rosario BROCKS, MD;  Location: [facility] ENDOSCOPY;  Service: Gastroenterology;  Laterality: N/A;   ESOPHAGOGASTRODUODENOSCOPY (EGD) WITH PROPOFOL  N/A 02/18/2023   Procedure: ESOPHAGOGASTRODUODENOSCOPY (EGD) WITH PROPOFOL ;  Surgeon:  Leigh Elspeth SQUIBB, MD;  Location: [facility] ENDOSCOPY;  Service: Gastroenterology;  Laterality: N/A;   INCISION AND DRAINAGE OF WOUND Left 05/17/2024   Procedure: IRRIGATION AND DEBRIDEMENT WOUND;  Surgeon: Serene Gaile ORN, MD;  Location: [facility] OR;  Service: Vascular;  Laterality: Left;  Arm   INTRAMEDULLARY (IM) NAIL INTERTROCHANTERIC Right 10/15/2023   Procedure: INTRAMEDULLARY (IM) NAIL INTERTROCHANTERIC;  Surgeon: Kendal Franky SQUIBB, MD;  Location: [facility] OR;  Service: Orthopedics;  Laterality: Right;   IR FLUORO GUIDE CV LINE RIGHT  10/15/2023   IR TUNNELED CENTRAL VENOUS CATH PLC W IMG  07/31/2024   IR US  GUIDE VASC ACCESS RIGHT  10/15/2023   IRRIGATION AND DEBRIDEMENT KNEE Right 08/04/2024   Procedure: IRRIGATION AND DEBRIDEMENT KNEE, RIGHT;  Surgeon: Harden Jerona GAILS, MD;  Location: [facility] OR;  Service: Orthopedics;  Laterality: Right;  Debridement Right Popliteal Fossa Abscess   POLYPECTOMY     WISDOM TOOTH EXTRACTION     Social History   Occupational History   Occupation: unemployed  Tobacco Use   Smoking status: Every Day    Current packs/day: 0.50    Average packs/day: 0.5 packs/day for 36.4 years (18.1 ttl pk-yrs)    Types: Cigarettes    Start date: 11/23/1988   Smokeless tobacco: Never   Tobacco comments:    6 cigarettes daily- khj 05/24/2024        Started smoking at 53 years old    Smoked 1PPD at his heaviest  Vaping Use   Vaping status: Never Used  Substance and Sexual Activity   Alcohol use: No   Drug use: No   Sexual activity: Not Currently

## 2024-10-23 ENCOUNTER — Encounter

## 2024-10-31 ENCOUNTER — Other Ambulatory Visit: Payer: Self-pay

## 2024-10-31 ENCOUNTER — Encounter (HOSPITAL_COMMUNITY): Payer: Self-pay

## 2024-10-31 DIAGNOSIS — M62838 Other muscle spasm: Secondary | ICD-10-CM

## 2024-11-02 ENCOUNTER — Other Ambulatory Visit: Payer: Self-pay | Admitting: Family Medicine

## 2024-11-02 DIAGNOSIS — R0981 Nasal congestion: Secondary | ICD-10-CM

## 2024-11-02 DIAGNOSIS — R052 Subacute cough: Secondary | ICD-10-CM

## 2024-11-02 MED ORDER — TRELEGY ELLIPTA 100-62.5-25 MCG/ACT IN AEPB: 1.0000 | INHALATION_SPRAY | Freq: Every day | RESPIRATORY_TRACT | 3 refills | Status: AC

## 2024-11-02 MED ORDER — FLUTICASONE PROPIONATE 50 MCG/ACT NA SUSP: 2.0000 | Freq: Every day | NASAL | 3 refills | Status: AC | PRN

## 2024-11-08 ENCOUNTER — Encounter: Payer: Self-pay | Admitting: Family Medicine

## 2024-11-08 ENCOUNTER — Ambulatory Visit: Admitting: Family Medicine

## 2024-11-08 ENCOUNTER — Other Ambulatory Visit: Payer: Self-pay | Admitting: Family Medicine

## 2024-11-08 VITALS — BP 167/89 | HR 97 | Temp 98.3°F | Ht 77.0 in | Wt 222.0 lb

## 2024-11-08 DIAGNOSIS — E1165 Type 2 diabetes mellitus with hyperglycemia: Secondary | ICD-10-CM | POA: Diagnosis not present

## 2024-11-08 DIAGNOSIS — I10 Essential (primary) hypertension: Secondary | ICD-10-CM

## 2024-11-08 DIAGNOSIS — Z992 Dependence on renal dialysis: Secondary | ICD-10-CM

## 2024-11-08 DIAGNOSIS — R112 Nausea with vomiting, unspecified: Secondary | ICD-10-CM | POA: Diagnosis not present

## 2024-11-08 DIAGNOSIS — Z794 Long term (current) use of insulin: Secondary | ICD-10-CM

## 2024-11-08 DIAGNOSIS — N186 End stage renal disease: Secondary | ICD-10-CM

## 2024-11-08 DIAGNOSIS — Z89511 Acquired absence of right leg below knee: Secondary | ICD-10-CM

## 2024-11-08 MED ORDER — BLOOD GLUCOSE TEST STRIPS 333 VI STRP: ORAL_STRIP | 3 refills | Status: DC

## 2024-11-08 MED ORDER — CARVEDILOL 12.5 MG PO TABS: 12.5000 mg | ORAL_TABLET | Freq: Two times a day (BID) | ORAL | 0 refills | Status: DC

## 2024-11-08 MED ORDER — GABAPENTIN 300 MG PO CAPS: 300.0000 mg | ORAL_CAPSULE | Freq: Every day | ORAL | 0 refills | Status: AC

## 2024-11-08 MED ORDER — DILTIAZEM HCL ER COATED BEADS 300 MG PO CP24: 300.0000 mg | ORAL_CAPSULE | Freq: Every day | ORAL | 0 refills | Status: AC

## 2024-11-08 MED ORDER — ONETOUCH VERIO VI STRP: ORAL_STRIP | 3 refills | Status: AC

## 2024-11-08 NOTE — Progress Notes (Signed)
° ° °  SUBJECTIVE:   CHIEF COMPLAINT / HPI:   Home health needs -Was discharged from SNF about 1 month ago -no one has come out to his house for 4 weeks, has been contacted but was told providers couldn't come to his home -just needs PT - Wound has healed, does not need wound care services anymore  HTN - Medications: Carvedilol  12.5 mg twice daily, diltiazem  300 mg daily, hydralazine  200 mg twice daily, losartan  25 mg daily - Compliance: Yes - Denies any SOB, CP, vision changes, LE edema, medication SEs, or symptoms of hypotension - ESRD TTS schedule, states he gets hypotensive occasionally at dialysis. Was referred to [facility] in August- didn't hear from them yet. Has been seen by Dr Gearline and Dr Macel in the past  Missed colonoscopy appt  PERTINENT  PMH / PSH: Severe hypertension, ESRD on dialysis, GERD, type 2 diabetes, history of right BKA  OBJECTIVE:   BP (!) 167/89   Pulse 97   Temp 98.3 F (36.8 C) (Oral)   Ht 6' 5 (1.956 m)   Wt 222 lb (100.7 kg)   SpO2 100%   BMI 26.33 kg/m   General: Awake and conversant, no acute distress CV: RRR, normal S1/S2, no M/R/G Pulm: CTAB, normal work of breathing on room air, no W/R/R. MSK: s/p R BKA, wears prosthetic limb, ambulates with walker Neuro: No focal deficits Psych: Appropriate mood and affect   ASSESSMENT/PLAN:   Assessment & Plan Severe hypertension ESRD (end stage renal disease) on dialysis (HCC) Blood pressure elevated to 167/89 on recheck today.  No red flag signs or symptoms today. Patient reports adherence with all antihypertensives.  Would like for patient to follow-up with nephrologist to continue managing hypertension.  Sent new referral for [facility] this patient is seen providers there in the recent past.  Refilled diltiazem  and carvedilol  per patient request History of right below knee amputation [facility]) Patient requires ongoing physical therapy, sent referral for this today.  Also  refilled gabapentin  per patient request Uncontrolled type 2 diabetes mellitus with hyperglycemia, with long-term current use of insulin  (HCC) Last A1c in July of 7.3.  Will be due again in January. Refilled blood glucose test strips.     Rea Raring, MD [facility]

## 2024-11-08 NOTE — Patient Instructions (Addendum)
 Thank you for coming in today! Here is a summary of what we discussed:  I sent a new referral to PT- please let us  know if you don't hear from them in the next week or so.  Call to ask about getting reestablished: [facility]           [address]  [place], [place] [address] Phone: [phone]  -Please schedule an appointment with your optometrist and podiatrist for diabetic exams  -Call the GI office to reschedule your colonoscopy [phone]  We are checking some labs today. If they are abnormal, I will call you. If they are normal, I will send you a MyChart message (if it is active) or a letter in the mail. If you do not hear about your labs in the next 2 weeks, please call the office.   Please call the clinic at [phone] if your symptoms worsen or you have any concerns.  Best, Dr Adele

## 2024-11-08 NOTE — Assessment & Plan Note (Signed)
 Last A1c in July of 7.3.  Will be due again in January. Refilled blood glucose test strips.

## 2024-11-08 NOTE — Assessment & Plan Note (Signed)
 Blood pressure elevated to 167/89 on recheck today.  No red flag signs or symptoms today. Patient reports adherence with all antihypertensives.  Would like for patient to follow-up with nephrologist to continue managing hypertension.  Sent new referral for [facility] this patient is seen providers there in the recent past.  Refilled diltiazem  and carvedilol  per patient request

## 2024-11-08 NOTE — Assessment & Plan Note (Signed)
 Patient requires ongoing physical therapy, sent referral for this today.  Also refilled gabapentin  per patient request

## 2024-11-10 ENCOUNTER — Telehealth: Payer: Self-pay

## 2024-11-10 ENCOUNTER — Ambulatory Visit: Admitting: Physician Assistant

## 2024-11-10 MED ORDER — ACCU-CHEK GUIDE W/DEVICE KIT: PACK | 0 refills | Status: AC

## 2024-11-10 MED ORDER — ACCU-CHEK SOFTCLIX LANCETS MISC: 12 refills | Status: AC

## 2024-11-10 NOTE — Telephone Encounter (Signed)
 Patient calls nurse line in regards to DM testing supplies.   He reports his insurance is no longer covering one touch verio supplies. He reports the pharmacy dispensed accu chek guide test strips.   He reports he now needs accu chek guide meter and lancets.   Will send in now.

## 2024-11-13 ENCOUNTER — Telehealth: Payer: Self-pay

## 2024-11-13 ENCOUNTER — Other Ambulatory Visit: Payer: Self-pay | Admitting: Family Medicine

## 2024-11-13 DIAGNOSIS — N186 End stage renal disease: Secondary | ICD-10-CM

## 2024-11-13 MED ORDER — LIDOCAINE-PRILOCAINE 2.5-2.5 % EX CREA: 1.0000 | TOPICAL_CREAM | CUTANEOUS | 0 refills | Status: AC | PRN

## 2024-11-13 NOTE — Telephone Encounter (Signed)
 Patient calls nurse line requesting a prescription numbing cream.  He reports he has been using a numbing gel  given to him by dialysis. However, he reports he can still feel the needles going in.   He reports he asked for a stronger numbing cream, however they stated he needed to contact his PCP.   Advised will forward to PCP.

## 2024-11-13 NOTE — Telephone Encounter (Signed)
 Called patient. He did not answer. Advised patient of update per PCP.   Chiquita JAYSON English, RN

## 2024-11-14 ENCOUNTER — Other Ambulatory Visit: Payer: Self-pay

## 2024-11-14 DIAGNOSIS — Z89511 Acquired absence of right leg below knee: Secondary | ICD-10-CM

## 2024-11-14 DIAGNOSIS — F332 Major depressive disorder, recurrent severe without psychotic features: Secondary | ICD-10-CM

## 2024-11-14 DIAGNOSIS — R339 Retention of urine, unspecified: Secondary | ICD-10-CM

## 2024-11-14 DIAGNOSIS — J189 Pneumonia, unspecified organism: Secondary | ICD-10-CM

## 2024-11-14 DIAGNOSIS — I10 Essential (primary) hypertension: Secondary | ICD-10-CM

## 2024-11-14 DIAGNOSIS — L299 Pruritus, unspecified: Secondary | ICD-10-CM | POA: Insufficient documentation

## 2024-11-15 MED ORDER — ATORVASTATIN CALCIUM 40 MG PO TABS: 40.0000 mg | ORAL_TABLET | Freq: Every day | ORAL | 3 refills | Status: AC

## 2024-11-15 MED ORDER — TAMSULOSIN HCL 0.4 MG PO CAPS: 0.4000 mg | ORAL_CAPSULE | Freq: Every day | ORAL | 3 refills | Status: AC

## 2024-11-15 MED ORDER — ALBUTEROL SULFATE HFA 108 (90 BASE) MCG/ACT IN AERS: 2.0000 | INHALATION_SPRAY | Freq: Four times a day (QID) | RESPIRATORY_TRACT | 2 refills | Status: AC | PRN

## 2024-11-15 MED ORDER — FLUOXETINE HCL 40 MG PO CAPS: 40.0000 mg | ORAL_CAPSULE | Freq: Every day | ORAL | 3 refills | Status: AC

## 2024-11-15 MED ORDER — LOSARTAN POTASSIUM 25 MG PO TABS: 25.0000 mg | ORAL_TABLET | Freq: Every day | ORAL | 3 refills | Status: AC

## 2024-11-15 MED ORDER — HYDRALAZINE HCL 100 MG PO TABS: 200.0000 mg | ORAL_TABLET | Freq: Two times a day (BID) | ORAL | 1 refills | Status: AC

## 2024-11-21 ENCOUNTER — Telehealth: Payer: Self-pay

## 2024-11-21 NOTE — Telephone Encounter (Signed)
 Pt called to schedule fistulogram. The order has been sent this morning downstairs to [facility]. Pt is aware now that they will be calling him to schedule this. No further questions/concerns at this time.

## 2024-11-27 ENCOUNTER — Other Ambulatory Visit: Payer: Self-pay

## 2024-11-27 ENCOUNTER — Encounter: Payer: Self-pay | Admitting: Family Medicine

## 2024-11-27 ENCOUNTER — Ambulatory Visit (HOSPITAL_COMMUNITY)
Admission: RE | Admit: 2024-11-27 | Discharge: 2024-11-27 | Disposition: A | Discharge status: Home / Self Care | Attending: Vascular Surgery | Admitting: Vascular Surgery

## 2024-11-27 ENCOUNTER — Other Ambulatory Visit: Payer: Self-pay | Admitting: Family Medicine

## 2024-11-27 ENCOUNTER — Encounter (HOSPITAL_COMMUNITY): Admission: RE | Disposition: A | Payer: Self-pay | Source: Home / Self Care | Attending: Vascular Surgery

## 2024-11-27 DIAGNOSIS — K862 Cyst of pancreas: Secondary | ICD-10-CM

## 2024-11-27 LAB — GLUCOSE, CAPILLARY: Glucose-Capillary: 246 mg/dL — ABNORMAL HIGH (ref 70–99)

## 2024-11-27 SURGERY — A/V FISTULAGRAM
Anesthesia: LOCAL

## 2024-11-27 MED ORDER — LIDOCAINE HCL (PF) 1 % IJ SOLN
INTRAMUSCULAR | Status: AC
  Filled 2024-11-27: qty 30

## 2024-11-27 MED ORDER — HEPARIN SODIUM (PORCINE) 1000 UNIT/ML IJ SOLN
INTRAMUSCULAR | Status: AC
  Filled 2024-11-27: qty 10

## 2024-11-28 ENCOUNTER — Telehealth: Payer: Self-pay | Admitting: Family Medicine

## 2024-11-28 NOTE — Telephone Encounter (Signed)
 Scheduled MRI for patient. The patient is aware about his appt which is 12/11/24 @1pm  at [name]

## 2024-11-28 NOTE — Telephone Encounter (Signed)
 Patient calls nurse line in regards to fpl group.   He is requesting scheduling at this time for imaging.   Will forward to referral coordinator to make sure we are good to go with scheduling.

## 2024-11-28 NOTE — Telephone Encounter (Signed)
-----   Message from [facility] Reena S sent at 11/28/2024 11:43 AM EST ----- Hey! Ok to schedule MRI at a [facility].  No prior approval is needed.  Thank you!! Margit

## 2024-11-29 ENCOUNTER — Other Ambulatory Visit: Payer: Self-pay | Admitting: Family Medicine

## 2024-11-29 DIAGNOSIS — M62838 Other muscle spasm: Secondary | ICD-10-CM

## 2024-12-01 ENCOUNTER — Other Ambulatory Visit: Payer: Self-pay | Admitting: *Deleted

## 2024-12-01 DIAGNOSIS — N186 End stage renal disease: Secondary | ICD-10-CM

## 2024-12-07 NOTE — H&P (View-Only) (Signed)
 "  Patient ID: [name] [name], male   DOB: August 29, 1971, 54 y.o.   MRN: [id number]  Reason for Consult: No chief complaint on file.   Referred by Adele Song, MD  Subjective:     HPI [name] [name] is a 54 y.o. male who presents for evaluation HD access creation.  Past Medical History: 11/30/2023: Abdominal pain 10/14/2023: Abdominal tenderness No date: Allergy     Comment:  seasonal and environmental No date: Anemia     Comment:  hx blood transfusion 04/27/24 12/01/2023: Chest pain 05/13/2020: Chronic constipation No date: Depression 2019: Diabetes mellitus without complication (HCC) No date: Dyspnea     Comment:  hx - SOB with exertion No date: ESRD on hemodialysis (HCC)     Comment:  Tues, Thurs, Sat No date: GERD (gastroesophageal reflux disease) No date: Hyperlipidemia No date: Hypertension 01/24/2024: Hypertensive urgency 12/01/2023: Hypotension 10/13/2017: Necrotizing fasciitis (HCC) No date: Neuromuscular disorder (HCC)     Comment:  neuropathy No date: PAD (peripheral artery disease) 10/12/2023: Patellar tendinitis No date: Secondary hyperparathyroidism No date: SVT (supraventricular tachycardia)     Comment:  hx 11/24/2017: Wound dehiscence  Family History  Problem Relation Age of Onset   Diabetes Mellitus II Mother    Depression Mother    Colon polyps Mother    Hypertension Mother    High Cholesterol Mother    Heart disease Father        CABG x 4.  04/2017   Diabetes Father    High Cholesterol Father    Hypertension Father    Heart disease Maternal Grandmother    Heart disease Maternal Grandfather    Colon cancer Neg Hx    Esophageal cancer Neg Hx    Liver cancer Neg Hx    Pancreatic cancer Neg Hx    Rectal cancer Neg Hx    Stomach cancer Neg Hx    Past Surgical History:  Procedure Laterality Date   A/V FISTULAGRAM N/A 03/17/2024   Procedure: A/V Fistulagram;  Surgeon: Magda Debby SAILOR, MD;  Location: [facility];  Service:  Cardiovascular;  Laterality: N/A;   AMPUTATION Right 10/13/2017   Procedure: RIGHT BELOW KNEE AMPUTATION;  Surgeon: Harden Jerona GAILS, MD;  Location: [facility] OR;  Service: Orthopedics;  Laterality: Right;   AMPUTATION Right 11/24/2017   Procedure: AMPUTATION BELOW KNEE REVISION;  Surgeon: Harden Jerona GAILS, MD;  Location: [facility] OR;  Service: Orthopedics;  Laterality: Right;   AMPUTATION TOE Left    AV FISTULA PLACEMENT Left 10/19/2023   Procedure: LEFT BRACHIOCEPHALIC ARTERIOVENOUS (AV) FISTULA CREATION;  Surgeon: Pearline [name] RAMAN, MD;  Location: [facility] OR;  Service: Vascular;  Laterality: Left;   AV FISTULA PLACEMENT Left 01/26/2024   Procedure: LEFT ARTERIOVENOUS (AV) FISTULA CREATION;  Surgeon: Pearline [name] RAMAN, MD;  Location: [facility] OR;  Service: Vascular;  Laterality: Left;   BASCILIC VEIN TRANSPOSITION Left 04/26/2024   Procedure: LEFT SECOND STAGE TRANSPOSITION, VEIN, BASILIC;  Surgeon: Magda Debby SAILOR, MD;  Location: [facility] OR;  Service: Vascular;  Laterality: Left;   BIOPSY  09/02/2021   Procedure: BIOPSY;  Surgeon: Federico Rosario BROCKS, MD;  Location: [facility] ENDOSCOPY;  Service: Gastroenterology;;   BIOPSY  02/18/2023   Procedure: BIOPSY;  Surgeon: Leigh Elspeth SQUIBB, MD;  Location: [facility] ENDOSCOPY;  Service: Gastroenterology;;   COLONOSCOPY  05/11/2019   COLONOSCOPY  2021   3 polyps - tubular adenomas   ESOPHAGOGASTRODUODENOSCOPY (EGD) WITH PROPOFOL  N/A 09/02/2021   Procedure: ESOPHAGOGASTRODUODENOSCOPY (EGD) WITH PROPOFOL ;  Surgeon: Federico Rosario BROCKS,  MD;  Location: [facility] ENDOSCOPY;  Service: Gastroenterology;  Laterality: N/A;   ESOPHAGOGASTRODUODENOSCOPY (EGD) WITH PROPOFOL  N/A 02/18/2023   Procedure: ESOPHAGOGASTRODUODENOSCOPY (EGD) WITH PROPOFOL ;  Surgeon: Leigh Elspeth SQUIBB, MD;  Location: [facility] ENDOSCOPY;  Service: Gastroenterology;  Laterality: N/A;   INCISION AND DRAINAGE OF WOUND Left 05/17/2024   Procedure: IRRIGATION AND DEBRIDEMENT WOUND;  Surgeon: Serene Gaile ORN, MD;  Location: [facility] OR;  Service: Vascular;   Laterality: Left;  Arm   INTRAMEDULLARY (IM) NAIL INTERTROCHANTERIC Right 10/15/2023   Procedure: INTRAMEDULLARY (IM) NAIL INTERTROCHANTERIC;  Surgeon: Kendal Franky SQUIBB, MD;  Location: [facility] OR;  Service: Orthopedics;  Laterality: Right;   IR FLUORO GUIDE CV LINE RIGHT  10/15/2023   IR TUNNELED CENTRAL VENOUS CATH PLC W IMG  07/31/2024   IR US  GUIDE VASC ACCESS RIGHT  10/15/2023   IRRIGATION AND DEBRIDEMENT KNEE Right 08/04/2024   Procedure: IRRIGATION AND DEBRIDEMENT KNEE, RIGHT;  Surgeon: Harden Jerona GAILS, MD;  Location: [facility] OR;  Service: Orthopedics;  Laterality: Right;  Debridement Right Popliteal Fossa Abscess   POLYPECTOMY     WISDOM TOOTH EXTRACTION      Short Social History:  Social History   Tobacco Use   Smoking status: Every Day    Current packs/day: 0.50    Average packs/day: 0.5 packs/day for 36.5 years (18.1 ttl pk-yrs)    Types: Cigarettes    Start date: 11/23/1988   Smokeless tobacco: Never   Tobacco comments:    6 cigarettes daily- khj 05/24/2024        Started smoking at 54 years old    Smoked 1PPD at his heaviest  Substance Use Topics   Alcohol use: No    Allergies[1]  Current Outpatient Medications  Medication Sig Dispense Refill   Accu-Chek Softclix Lancets lancets Use to check blood sugar once per day. E11.9 100 each 12   albuterol  (VENTOLIN  HFA) 108 (90 Base) MCG/ACT inhaler Inhale 2 puffs into the lungs every 6 (six) hours as needed for wheezing or shortness of breath. 8 g 2   atorvastatin  (LIPITOR) 40 MG tablet Take 1 tablet (40 mg total) by mouth daily. 90 tablet 3   Blood Glucose Monitoring Suppl (ACCU-CHEK GUIDE) w/Device KIT Use to check blood sugar once per day. E11.9 1 kit 0   Calcium  Acetate 667 MG TABS Take 1,334 mg by mouth 3 (three) times daily. (Patient taking differently: Take 1,334 mg by mouth 2 (two) times daily.)     carvedilol  (COREG ) 12.5 MG tablet TAKE 1 TABLET(12.5 MG) BY MOUTH TWICE DAILY WITH A MEAL 180 tablet 2   diltiazem  (CARDIZEM  CD) 300 MG  24 hr capsule Take 1 capsule (300 mg total) by mouth at bedtime. 90 capsule 0   FLUoxetine  (PROZAC ) 40 MG capsule Take 1 capsule (40 mg total) by mouth daily. 90 capsule 3   fluticasone  (FLONASE ) 50 MCG/ACT nasal spray Place 2 sprays into both nostrils daily as needed for allergies. 16 g 3   Fluticasone -Umeclidin-Vilant (TRELEGY ELLIPTA ) 100-62.5-25 MCG/ACT AEPB Inhale 1 puff into the lungs daily. INHALE 1 PUFF INTO LUNGS DAILY 60 each 3   furosemide  (LASIX ) 40 MG tablet Take 40 mg by mouth 2 (two) times daily.     gabapentin  (NEURONTIN ) 300 MG capsule Take 1 capsule (300 mg total) by mouth at bedtime. 90 capsule 0   glucose blood (ONETOUCH VERIO) test strip Check blood sugar once daily 100 strip 3   hydrALAZINE  (APRESOLINE ) 100 MG tablet Take 2 tablets (200 mg total) by mouth 2 (two)  times daily. 120 tablet 1   insulin  glargine (LANTUS ) 100 UNIT/ML injection Inject 0.22 mLs (22 Units total) into the skin daily.     insulin  lispro (HUMALOG ) 100 UNIT/ML injection 0-6 Units, Subcutaneous, 3 times daily with meals, First dose on Sun 10/17/23 at 0800 Correction coverage: Very Sensitive (ESRD/Dialysis) CBG < 70: Implement Hypoglycemia Standing Orders and refer to Hypoglycemia Standing Orders sidebar report CBG 70 - 120: 0 units CBG 121 - 150: 0 units CBG 151 - 200: 1 unit CBG 201-250: 2 units CBG 251-300: 3 units CBG 301-350: 4 units CBG 351-400: 5 units CBG > 400: Give 6 units and call MD     lidocaine -prilocaine  (EMLA ) cream Apply 1 Application topically as needed (to use with dialysis). 30 g 0   losartan  (COZAAR ) 25 MG tablet Take 1 tablet (25 mg total) by mouth daily. 90 tablet 3   methocarbamol  (ROBAXIN ) 750 MG tablet TAKE 1 TABLET(750 MG) BY MOUTH EVERY 8 HOURS AS NEEDED FOR MUSCLE SPASMS 30 tablet 0   ondansetron  (ZOFRAN -ODT) 4 MG disintegrating tablet Take 1 tablet (4 mg total) by mouth every 8 (eight) hours as needed for nausea or vomiting. 20 tablet 0   Oxycodone  HCl 10 MG TABS Take 1  tablet (10 mg total) by mouth every 8 (eight) hours as needed. 20 tablet 0   pantoprazole  (PROTONIX ) 40 MG tablet Take 1 tablet (40 mg total) by mouth daily. 90 tablet 3   polyethylene glycol (MIRALAX  / GLYCOLAX ) 17 g packet Take 17 g by mouth daily.     tamsulosin  (FLOMAX ) 0.4 MG CAPS capsule Take 1 capsule (0.4 mg total) by mouth daily. 30 capsule 3   No current facility-administered medications for this visit.    REVIEW OF SYSTEMS All other systems were reviewed and are negative    Objective:  Objective   There were no vitals filed for this visit. There is no height or weight on file to calculate BMI.  Physical Exam General: no acute distress Cardiac: hemodynamically stable Extremities: No pulse or thrill in left arm BC AVF for DVT Vascular:   Right: palpable brachial, radial  Left: palpable brachial, radial       Assessment/Plan:     [name] [name] is a 54 y.o. male with ESRD who presents to discuss permanent access creation.  Dominant hand: right Previous access surgeries: Failed left arm BC and BVT Previous catheters: right IJ Currently dialyzing via R internal jugular on TTS   The risks an benefits including of access creation were reviewed including: need for additional procedures, need for additional creations, steal, ischemia monomelic neuropathy, failure of access, and bleeding. The patient expressed understand and is willing to proceed.  I explained that I will perform intraoperative vein mapping to confirm the above findings and will determine the most appropriate access to create but with a preoperative plan for left arm AVG.     [name] [name] Serve MD [facility]     [1]  Allergies Allergen Reactions   Iron  Nausea And Vomiting    With the iron  tablets; patient can take iron  injections   Tape Rash    Rash at site of tape--okay to use paper tape   Wound Dressing Adhesive Rash    Pt can use paper tape.    "

## 2024-12-07 NOTE — Progress Notes (Signed)
 "  Patient ID: [name] [name], male   DOB: August 29, 1971, 54 y.o.   MRN: [id number]  Reason for Consult: No chief complaint on file.   Referred by Adele Song, MD  Subjective:     HPI [name] [name] is a 54 y.o. male who presents for evaluation HD access creation.  Past Medical History: 11/30/2023: Abdominal pain 10/14/2023: Abdominal tenderness No date: Allergy     Comment:  seasonal and environmental No date: Anemia     Comment:  hx blood transfusion 04/27/24 12/01/2023: Chest pain 05/13/2020: Chronic constipation No date: Depression 2019: Diabetes mellitus without complication (HCC) No date: Dyspnea     Comment:  hx - SOB with exertion No date: ESRD on hemodialysis (HCC)     Comment:  Tues, Thurs, Sat No date: GERD (gastroesophageal reflux disease) No date: Hyperlipidemia No date: Hypertension 01/24/2024: Hypertensive urgency 12/01/2023: Hypotension 10/13/2017: Necrotizing fasciitis (HCC) No date: Neuromuscular disorder (HCC)     Comment:  neuropathy No date: PAD (peripheral artery disease) 10/12/2023: Patellar tendinitis No date: Secondary hyperparathyroidism No date: SVT (supraventricular tachycardia)     Comment:  hx 11/24/2017: Wound dehiscence  Family History  Problem Relation Age of Onset   Diabetes Mellitus II Mother    Depression Mother    Colon polyps Mother    Hypertension Mother    High Cholesterol Mother    Heart disease Father        CABG x 4.  04/2017   Diabetes Father    High Cholesterol Father    Hypertension Father    Heart disease Maternal Grandmother    Heart disease Maternal Grandfather    Colon cancer Neg Hx    Esophageal cancer Neg Hx    Liver cancer Neg Hx    Pancreatic cancer Neg Hx    Rectal cancer Neg Hx    Stomach cancer Neg Hx    Past Surgical History:  Procedure Laterality Date   A/V FISTULAGRAM N/A 03/17/2024   Procedure: A/V Fistulagram;  Surgeon: Magda Debby SAILOR, MD;  Location: [facility];  Service:  Cardiovascular;  Laterality: N/A;   AMPUTATION Right 10/13/2017   Procedure: RIGHT BELOW KNEE AMPUTATION;  Surgeon: Harden Jerona GAILS, MD;  Location: [facility] OR;  Service: Orthopedics;  Laterality: Right;   AMPUTATION Right 11/24/2017   Procedure: AMPUTATION BELOW KNEE REVISION;  Surgeon: Harden Jerona GAILS, MD;  Location: [facility] OR;  Service: Orthopedics;  Laterality: Right;   AMPUTATION TOE Left    AV FISTULA PLACEMENT Left 10/19/2023   Procedure: LEFT BRACHIOCEPHALIC ARTERIOVENOUS (AV) FISTULA CREATION;  Surgeon: Pearline Norman RAMAN, MD;  Location: [facility] OR;  Service: Vascular;  Laterality: Left;   AV FISTULA PLACEMENT Left 01/26/2024   Procedure: LEFT ARTERIOVENOUS (AV) FISTULA CREATION;  Surgeon: Pearline Norman RAMAN, MD;  Location: [facility] OR;  Service: Vascular;  Laterality: Left;   BASCILIC VEIN TRANSPOSITION Left 04/26/2024   Procedure: LEFT SECOND STAGE TRANSPOSITION, VEIN, BASILIC;  Surgeon: Magda Debby SAILOR, MD;  Location: [facility] OR;  Service: Vascular;  Laterality: Left;   BIOPSY  09/02/2021   Procedure: BIOPSY;  Surgeon: Federico Rosario BROCKS, MD;  Location: [facility] ENDOSCOPY;  Service: Gastroenterology;;   BIOPSY  02/18/2023   Procedure: BIOPSY;  Surgeon: Leigh Elspeth SQUIBB, MD;  Location: [facility] ENDOSCOPY;  Service: Gastroenterology;;   COLONOSCOPY  05/11/2019   COLONOSCOPY  2021   3 polyps - tubular adenomas   ESOPHAGOGASTRODUODENOSCOPY (EGD) WITH PROPOFOL  N/A 09/02/2021   Procedure: ESOPHAGOGASTRODUODENOSCOPY (EGD) WITH PROPOFOL ;  Surgeon: Federico Rosario BROCKS,  MD;  Location: [facility] ENDOSCOPY;  Service: Gastroenterology;  Laterality: N/A;   ESOPHAGOGASTRODUODENOSCOPY (EGD) WITH PROPOFOL  N/A 02/18/2023   Procedure: ESOPHAGOGASTRODUODENOSCOPY (EGD) WITH PROPOFOL ;  Surgeon: Leigh Elspeth SQUIBB, MD;  Location: [facility] ENDOSCOPY;  Service: Gastroenterology;  Laterality: N/A;   INCISION AND DRAINAGE OF WOUND Left 05/17/2024   Procedure: IRRIGATION AND DEBRIDEMENT WOUND;  Surgeon: Serene Gaile ORN, MD;  Location: [facility] OR;  Service: Vascular;   Laterality: Left;  Arm   INTRAMEDULLARY (IM) NAIL INTERTROCHANTERIC Right 10/15/2023   Procedure: INTRAMEDULLARY (IM) NAIL INTERTROCHANTERIC;  Surgeon: Kendal Franky SQUIBB, MD;  Location: [facility] OR;  Service: Orthopedics;  Laterality: Right;   IR FLUORO GUIDE CV LINE RIGHT  10/15/2023   IR TUNNELED CENTRAL VENOUS CATH PLC W IMG  07/31/2024   IR US  GUIDE VASC ACCESS RIGHT  10/15/2023   IRRIGATION AND DEBRIDEMENT KNEE Right 08/04/2024   Procedure: IRRIGATION AND DEBRIDEMENT KNEE, RIGHT;  Surgeon: Harden Jerona GAILS, MD;  Location: [facility] OR;  Service: Orthopedics;  Laterality: Right;  Debridement Right Popliteal Fossa Abscess   POLYPECTOMY     WISDOM TOOTH EXTRACTION      Short Social History:  Social History   Tobacco Use   Smoking status: Every Day    Current packs/day: 0.50    Average packs/day: 0.5 packs/day for 36.5 years (18.1 ttl pk-yrs)    Types: Cigarettes    Start date: 11/23/1988   Smokeless tobacco: Never   Tobacco comments:    6 cigarettes daily- khj 05/24/2024        Started smoking at 54 years old    Smoked 1PPD at his heaviest  Substance Use Topics   Alcohol use: No    Allergies[1]  Current Outpatient Medications  Medication Sig Dispense Refill   Accu-Chek Softclix Lancets lancets Use to check blood sugar once per day. E11.9 100 each 12   albuterol  (VENTOLIN  HFA) 108 (90 Base) MCG/ACT inhaler Inhale 2 puffs into the lungs every 6 (six) hours as needed for wheezing or shortness of breath. 8 g 2   atorvastatin  (LIPITOR) 40 MG tablet Take 1 tablet (40 mg total) by mouth daily. 90 tablet 3   Blood Glucose Monitoring Suppl (ACCU-CHEK GUIDE) w/Device KIT Use to check blood sugar once per day. E11.9 1 kit 0   Calcium  Acetate 667 MG TABS Take 1,334 mg by mouth 3 (three) times daily. (Patient taking differently: Take 1,334 mg by mouth 2 (two) times daily.)     carvedilol  (COREG ) 12.5 MG tablet TAKE 1 TABLET(12.5 MG) BY MOUTH TWICE DAILY WITH A MEAL 180 tablet 2   diltiazem  (CARDIZEM  CD) 300 MG  24 hr capsule Take 1 capsule (300 mg total) by mouth at bedtime. 90 capsule 0   FLUoxetine  (PROZAC ) 40 MG capsule Take 1 capsule (40 mg total) by mouth daily. 90 capsule 3   fluticasone  (FLONASE ) 50 MCG/ACT nasal spray Place 2 sprays into both nostrils daily as needed for allergies. 16 g 3   Fluticasone -Umeclidin-Vilant (TRELEGY ELLIPTA ) 100-62.5-25 MCG/ACT AEPB Inhale 1 puff into the lungs daily. INHALE 1 PUFF INTO LUNGS DAILY 60 each 3   furosemide  (LASIX ) 40 MG tablet Take 40 mg by mouth 2 (two) times daily.     gabapentin  (NEURONTIN ) 300 MG capsule Take 1 capsule (300 mg total) by mouth at bedtime. 90 capsule 0   glucose blood (ONETOUCH VERIO) test strip Check blood sugar once daily 100 strip 3   hydrALAZINE  (APRESOLINE ) 100 MG tablet Take 2 tablets (200 mg total) by mouth 2 (two)  times daily. 120 tablet 1   insulin  glargine (LANTUS ) 100 UNIT/ML injection Inject 0.22 mLs (22 Units total) into the skin daily.     insulin  lispro (HUMALOG ) 100 UNIT/ML injection 0-6 Units, Subcutaneous, 3 times daily with meals, First dose on Sun 10/17/23 at 0800 Correction coverage: Very Sensitive (ESRD/Dialysis) CBG < 70: Implement Hypoglycemia Standing Orders and refer to Hypoglycemia Standing Orders sidebar report CBG 70 - 120: 0 units CBG 121 - 150: 0 units CBG 151 - 200: 1 unit CBG 201-250: 2 units CBG 251-300: 3 units CBG 301-350: 4 units CBG 351-400: 5 units CBG > 400: Give 6 units and call MD     lidocaine -prilocaine  (EMLA ) cream Apply 1 Application topically as needed (to use with dialysis). 30 g 0   losartan  (COZAAR ) 25 MG tablet Take 1 tablet (25 mg total) by mouth daily. 90 tablet 3   methocarbamol  (ROBAXIN ) 750 MG tablet TAKE 1 TABLET(750 MG) BY MOUTH EVERY 8 HOURS AS NEEDED FOR MUSCLE SPASMS 30 tablet 0   ondansetron  (ZOFRAN -ODT) 4 MG disintegrating tablet Take 1 tablet (4 mg total) by mouth every 8 (eight) hours as needed for nausea or vomiting. 20 tablet 0   Oxycodone  HCl 10 MG TABS Take 1  tablet (10 mg total) by mouth every 8 (eight) hours as needed. 20 tablet 0   pantoprazole  (PROTONIX ) 40 MG tablet Take 1 tablet (40 mg total) by mouth daily. 90 tablet 3   polyethylene glycol (MIRALAX  / GLYCOLAX ) 17 g packet Take 17 g by mouth daily.     tamsulosin  (FLOMAX ) 0.4 MG CAPS capsule Take 1 capsule (0.4 mg total) by mouth daily. 30 capsule 3   No current facility-administered medications for this visit.    REVIEW OF SYSTEMS All other systems were reviewed and are negative    Objective:  Objective   There were no vitals filed for this visit. There is no height or weight on file to calculate BMI.  Physical Exam General: no acute distress Cardiac: hemodynamically stable Extremities: No pulse or thrill in left arm BC AVF for DVT Vascular:   Right: palpable brachial, radial  Left: palpable brachial, radial       Assessment/Plan:     ERRIC MACHNIK is a 54 y.o. male with ESRD who presents to discuss permanent access creation.  Dominant hand: right Previous access surgeries: Failed left arm BC and BVT Previous catheters: right IJ Currently dialyzing via R internal jugular on TTS   The risks an benefits including of access creation were reviewed including: need for additional procedures, need for additional creations, steal, ischemia monomelic neuropathy, failure of access, and bleeding. The patient expressed understand and is willing to proceed.  I explained that I will perform intraoperative vein mapping to confirm the above findings and will determine the most appropriate access to create but with a preoperative plan for left arm AVG.     Norman GORMAN Serve MD [facility]     [1]  Allergies Allergen Reactions   Iron  Nausea And Vomiting    With the iron  tablets; patient can take iron  injections   Tape Rash    Rash at site of tape--okay to use paper tape   Wound Dressing Adhesive Rash    Pt can use paper tape.    "

## 2024-12-08 ENCOUNTER — Other Ambulatory Visit: Payer: Self-pay

## 2024-12-08 ENCOUNTER — Ambulatory Visit (HOSPITAL_COMMUNITY)
Admission: RE | Admit: 2024-12-08 | Discharge: 2024-12-08 | Disposition: A | Discharge status: Home / Self Care | Source: Ambulatory Visit | Attending: Surgery | Admitting: Surgery

## 2024-12-08 ENCOUNTER — Ambulatory Visit: Admitting: Vascular Surgery

## 2024-12-08 ENCOUNTER — Ambulatory Visit (HOSPITAL_BASED_OUTPATIENT_CLINIC_OR_DEPARTMENT_OTHER)
Admission: RE | Admit: 2024-12-08 | Discharge: 2024-12-08 | Disposition: A | Discharge status: Home / Self Care | Source: Ambulatory Visit | Attending: Surgery | Admitting: Surgery

## 2024-12-08 ENCOUNTER — Encounter: Payer: Self-pay | Admitting: Vascular Surgery

## 2024-12-08 VITALS — BP 187/116 | HR 109 | Temp 98.0°F

## 2024-12-08 DIAGNOSIS — Z992 Dependence on renal dialysis: Secondary | ICD-10-CM

## 2024-12-08 DIAGNOSIS — N186 End stage renal disease: Secondary | ICD-10-CM | POA: Insufficient documentation

## 2024-12-11 ENCOUNTER — Other Ambulatory Visit: Payer: Self-pay | Admitting: Family Medicine

## 2024-12-11 ENCOUNTER — Ambulatory Visit (HOSPITAL_COMMUNITY)
Admission: RE | Admit: 2024-12-11 | Discharge: 2024-12-11 | Disposition: A | Discharge status: Home / Self Care | Source: Ambulatory Visit | Attending: Family Medicine | Admitting: Family Medicine

## 2024-12-11 DIAGNOSIS — K862 Cyst of pancreas: Secondary | ICD-10-CM | POA: Diagnosis present

## 2024-12-11 MED ORDER — GADOBUTROL 1 MMOL/ML IV SOLN
10.0000 mL | Freq: Once | INTRAVENOUS | Status: AC | PRN
  Administered 2024-12-11: 10 mL via INTRAVENOUS

## 2024-12-12 ENCOUNTER — Ambulatory Visit: Payer: Self-pay | Admitting: Family Medicine

## 2024-12-13 ENCOUNTER — Encounter: Payer: Self-pay | Admitting: Family Medicine

## 2024-12-13 DIAGNOSIS — K5909 Other constipation: Secondary | ICD-10-CM | POA: Insufficient documentation

## 2024-12-13 DIAGNOSIS — R918 Other nonspecific abnormal finding of lung field: Secondary | ICD-10-CM | POA: Insufficient documentation

## 2024-12-15 ENCOUNTER — Other Ambulatory Visit: Payer: Self-pay

## 2024-12-15 ENCOUNTER — Encounter (HOSPITAL_COMMUNITY): Payer: Self-pay | Admitting: Vascular Surgery

## 2024-12-15 NOTE — Progress Notes (Signed)
 PCP - Adele Song, MD  Cardiologist - Denies  EKG - 09/06/2024 Chest x-ray - 08/03/2024 ECHO - 05/10/2024 Cardiac Cath - Denies  Sleep Apnea- Denies  DM Type II Fasting Blood Sugar:  130's 140's Checks Blood Sugar:  once/day  Blood Thinner Instructions: Denies Aspirin  Instructions: Denies  ERAS Protcol - N/A.  NPO ordered COVID TEST- N/A  Anesthesia review: Yes  -------------  SDW INSTRUCTIONS:  Your procedure is scheduled on Wednesday Jan 28th. Please report to [facility] Main Entrance A at 0530 A.M., and check in at the Admitting office. Call this number if you have problems the morning of surgery: [phone]   Remember: Do not eat or  after midnight the night before your surgery    Medications to take morning of surgery with a sip of water include: atorvastatin  (LIPITOR)  carvedilol  (COREG ) 12.5 MG  FLUoxetine  (PROZAC )  Fluticasone -Umeclidin-Vilant (TRELEGY ELLIPTA )  methocarbamol  (ROBAXIN ) 750 MG omeprazole (PRILOSEC) 20 MG  pantoprazole  (PROTONIX ) 40 MG tamsulosin  (FLOMAX ) 0.4 MG CAPS    If NEEDED albuterol  (VENTOLIN  HFA)  fluticasone  (FLONASE )  Oxycodone  HCl 10 MG TABS  ondansetron  (ZOFRAN -ODT) 4 MG    As of today, STOP taking any Aspirin  (unless otherwise instructed by your surgeon), Aleve, Naproxen, Ibuprofen, Motrin, Advil, Goody's, BC's, all herbal medications, fish oil, and all vitamins.  WHAT DO I DO ABOUT MY DIABETES MEDICATION?   Do not take oral diabetes medicines (pills) the morning of surgery.  THE NIGHT BEFORE SURGERY, take PT DENIES TAKING LANTUS        The day of surgery, do not take other diabetes injectables, including Byetta (exenatide), Bydureon (exenatide ER), Victoza (liraglutide), or Trulicity  (dulaglutide ).  If your CBG is greater than 220 mg/dL, you may take  of your sliding scale (correction) dose of insulin .   HOW TO MANAGE YOUR DIABETES BEFORE AND AFTER SURGERY  Why is it important to control my blood sugar before  and after surgery? Improving blood sugar levels before and after surgery helps healing and can limit problems. A way of improving blood sugar control is eating a healthy diet by:  Eating less sugar and carbohydrates  Increasing activity/exercise  Talking with your doctor about reaching your blood sugar goals High blood sugars (greater than 180 mg/dL) can raise your risk of infections and slow your recovery, so you will need to focus on controlling your diabetes during the weeks before surgery. Make sure that the doctor who takes care of your diabetes knows about your planned surgery including the date and location.  How do I manage my blood sugar before surgery? Check your blood sugar at least 4 times a day, starting 2 days before surgery, to make sure that the level is not too high or low.  Check your blood sugar the morning of your surgery when you wake up and every 2 hours until you get to the Short Stay unit.  If your blood sugar is less than 70 mg/dL, you will need to treat for low blood sugar: Do not take insulin . Treat a low blood sugar (less than 70 mg/dL) with  cup of clear juice (cranberry or apple), 4 glucose tablets, OR glucose gel. Recheck blood sugar in 15 minutes after treatment (to make sure it is greater than 70 mg/dL). If your blood sugar is not greater than 70 mg/dL on recheck, call [phone] for further instructions. Report your blood sugar to the short stay nurse when you get to Short Stay.  If you are admitted to the hospital after  surgery: Your blood sugar will be checked by the staff and you will probably be given insulin  after surgery (instead of oral diabetes medicines) to make sure you have good blood sugar levels. The goal for blood sugar control after surgery is 80-180 mg/dL.   The Morning of Surgery Do not wear jewelry, make-up or nail polish. Do not wear lotions, powders, or perfumes/colognes, or deodorant Do not bring valuables to the hospital. [facility] is not responsible for any belongings or valuables.  If you are a smoker, DO NOT Smoke 24 hours prior to surgery  If you wear a CPAP at night please bring your mask the morning of surgery   Remember that you must have someone to transport you home after your surgery, and remain with you for 24 hours if you are discharged the same day.  Please bring cases for contacts, glasses, hearing aids, dentures or bridgework because it cannot be worn into surgery.   Patients discharged the day of surgery will not be allowed to drive home.   Please shower the NIGHT BEFORE/MORNING OF SURGERY (use antibacterial soap like DIAL  soap if possible). Wear comfortable clothes the morning of surgery. Oral Hygiene is also important to reduce your risk of infection.  Remember - BRUSH YOUR TEETH THE MORNING OF SURGERY WITH YOUR REGULAR TOOTHPASTE  Patient denies shortness of breath, fever, cough and chest pain.

## 2024-12-19 NOTE — Anesthesia Preprocedure Evaluation (Addendum)
"                                    Anesthesia Evaluation  Patient identified by MRN, date of birth, ID band Patient awake    Reviewed: Allergy & Precautions, NPO status , Patient's Chart, lab work & pertinent test results, reviewed documented beta blocker date and time   Airway Mallampati: II  TM Distance: >3 FB Neck ROM: Full    Dental  (+) Teeth Intact, Dental Advisory Given   Pulmonary Current Smoker and Patient abstained from smoking.   Pulmonary exam normal breath sounds clear to auscultation       Cardiovascular hypertension, Pt. on home beta blockers and Pt. on medications + Peripheral Vascular Disease (s/p R BKA) and +CHF  Normal cardiovascular exam+ dysrhythmias Supra Ventricular Tachycardia  Rhythm:Regular Rate:Normal  Echo 05/10/24: 1. Left ventricular ejection fraction, by estimation, is 55 to 60%. The  left ventricle has normal function. The left ventricle has no regional  wall motion abnormalities. There is mild asymmetric left ventricular  hypertrophy. Left ventricular diastolic  parameters were normal.   2. Right ventricular systolic function is normal. The right ventricular  size is normal.   3. Left atrial size was mildly dilated.   4. Right atrial size was mildly dilated.   5. Mild mitral valve regurgitation.   6. The aortic valve is tricuspid. Aortic valve regurgitation is not  visualized. Aortic valve sclerosis/calcification is present, without any  evidence of aortic stenosis.   7. The inferior vena cava is normal in size with greater than 50%  respiratory variability, suggesting right atrial pressure of 3 mmHg.     Neuro/Psych  PSYCHIATRIC DISORDERS  Depression    Sensorineural hearing loss (SNHL) of both ears  Neuromuscular disease    GI/Hepatic Neg liver ROS,GERD  ,,  Endo/Other  diabetes, Type 2, Insulin  Dependent    Renal/GU ESRF and DialysisRenal disease (TTHSAT)currently dialyzing via right IJ TDC on TTS      Musculoskeletal  (+) Arthritis ,    Abdominal   Peds  Hematology negative hematology ROS (+)   Anesthesia Other Findings   Reproductive/Obstetrics                              Anesthesia Physical Anesthesia Plan  ASA: 4  Anesthesia Plan: Regional   Post-op Pain Management: Regional block* and Tylenol  PO (pre-op )*   Induction: Intravenous  PONV Risk Score and Plan: 0 and TIVA, Midazolam  and Treatment may vary due to age or medical condition  Airway Management Planned: Simple Face Mask and Natural Airway  Additional Equipment:   Intra-op Plan:   Post-operative Plan:   Informed Consent: I have reviewed the patients History and Physical, chart, labs and discussed the procedure including the risks, benefits and alternatives for the proposed anesthesia with the patient or authorized representative who has indicated his/her understanding and acceptance.     Dental advisory given  Plan Discussed with: CRNA  Anesthesia Plan Comments: (PAT note written 12/19/2024 by Allison Zelenak, PA-C.  )         Anesthesia Quick Evaluation  "

## 2024-12-19 NOTE — Progress Notes (Signed)
 Anesthesia Chart Review: SAME DAY WORK-UP  Case: [id number] Date/Time: 12/20/24 0715   Procedure: INSERTION, GRAFT, ARTERIOVENOUS, UPPER EXTREMITY (Left)   Anesthesia type: Monitor Anesthesia Care   Diagnosis: ESRD (end stage renal disease) (HCC) [N18.6]   Pre-op  diagnosis: ESRD   [facility] / [facility]   Surgeons: Magda Debby SAILOR, MD       DISCUSSION: Patient is a 54 year old male scheduled for the above procedure.  Per 12/08/2024 vascular visit, he was currently dialyzing via right IJ TDC on TTS.  He previously failed left arm BC and DVT and new permanent HD access planned.  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  She notes he was referred to Dr. Verlin in April 2023 for palpitations.  Monitor noted SVT.  Metoprolol  increased and EP referral for possible ablation recommended.  However PCP had already increased metoprolol  so no further changes made.  He wanted to try to avoid procedures.  At last visit he was on Toprol  and felt he had some benefit with his symptoms. 3 month follow-up had been planned. Last EKG on 09/05/2024 showed NSR.  Meds include Coreg  and diltiazem .  Anesthesia team to evaluate on the day of surgery. He will get updated vitals and labs on arrival.   VS:  Wt Readings from Last 3 Encounters:  11/08/24 100.7 kg  09/09/24 100.2 kg  08/08/24 113.9 kg   BP Readings from Last 3 Encounters:  12/08/24 (!) 187/116  11/27/24 128/71  11/08/24 (!) 167/89   Pulse Readings from Last 3 Encounters:  12/08/24 (!) 109  11/27/24 (!) 52  11/08/24 97    PROVIDERS: Adele Song,  MD Jerrye Mar, MD is nephrologist Inocencio, Will, MD is EP   LABS: Most recent results include HGB 9.9 12/14/2024 at [facility]. A1c 6.8% 09/05/2024.    IMAGES: MRI Abd/MRCP 12/11/2024: IMPRESSION: 1. Redemonstration of multiple cystic lesions throughout the pancreas with imaging characteristics favoring pancreatic side-branch IPMN. No suspicious features. Follow-up examination is recommended in 1 year. 2. Cholelithiasis without acute cholecystitis. 3. Hepatosplenomegaly. 4. There is marked T2 hypointense signal intensity of the liver which exhibit loss of signal on in phase images, in comparison to the out of phase images, suggesting deposition of paramagnetic substances such as iron , melanin, etc. Correlate clinically and with serum iron  profile. 5. There is diffuse nodular thickening of the left adrenal gland measuring up to 1.3 x 3.1 cm, similar to the prior study. This is incompletely characterized on the current exam but favored to represent adrenal hyperplasia. Attention on follow-up imaging is recommended.    EKG: 09/05/2024: Normal sinus rhythm   CV: Echo 05/10/2024: IMPRESSIONS   1. Left ventricular ejection fraction, by estimation, is 55 to 60%. The  left ventricle has normal function. The left ventricle has no regional  wall motion abnormalities. There is mild asymmetric left ventricular  hypertrophy. Left ventricular diastolic  parameters were normal.   2. Right ventricular systolic function is normal. The right ventricular  size is normal.   3. Left atrial size was mildly dilated.   4. Right atrial size was mildly dilated.   5. Mild mitral valve regurgitation.   6. The aortic valve is tricuspid. Aortic  valve regurgitation is not  visualized. Aortic valve sclerosis/calcification is present, without any  evidence of aortic stenosis.   7. The inferior vena cava is normal in size with greater than 50%  respiratory variability, suggesting right atrial  pressure of 3 mmHg.  - Comparison(s): The left ventricular function is unchanged.   Long-term monitor 03/17/2023: Patch Wear Time:  13 days and 22 hours Predominant rhythm was sinus rhythm 752 SVT episodes, longest and fastest 19.2 seconds average 144 bpm. Less than 1% ventricular and supraventricular ectopy Triggered episodes associated with sinus tachycardia  Past Medical History:  Diagnosis Date   A-V fistula 01/25/2024   Abdominal pain 11/30/2023   Abdominal tenderness 10/14/2023   Abscess of bursa, right knee 08/10/2024   Achilles tendon contracture, left 03/03/2018   Acquired absence of other left toe(s) 10/26/2023   Acquired absence of right leg below knee (HCC) 10/26/2023   Allergy    seasonal and environmental   Anaphylactic shock, unspecified, initial encounter 10/27/2023   Anemia    hx blood transfusion 04/27/24   Anemia of chronic disease 07/29/2024   Anorectal abscess 06/01/2024   Chest pain 12/01/2023   Chronic constipation 05/13/2020   Closed displaced fracture of distal phalanx of left great toe with delayed healing 12/28/2022   Closed intertrochanteric fracture of right hip (HCC) 10/13/2023   Contracture, 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   Depression    Diabetes mellitus without complication (HCC) 2019   Diabetic ulcer of left foot associated with type 2 diabetes mellitus (HCC) 12/28/2022   Dyspnea    hx - SOB with exertion   ESRD (end stage renal disease) on dialysis (HCC) 09/11/2022   Dialysis Tuesday-Thursday-Saturday     ESRD on hemodialysis (HCC)    Tues, Thurs, Sat   GERD (gastroesophageal reflux disease)    GERD with esophagitis 09/02/2021   History of partial ray amputation of first toe of left foot 02/04/2023   History of right below knee amputation (HCC) 12/22/2017   Hyperlipidemia    Hyperlipidemia associated with type 2 diabetes mellitus (HCC) 10/26/2023   Hypertension    Hypertensive heart and chronic kidney disease with heart failure and with stage 5  chronic kidney disease, or end stage renal disease (HCC) 10/26/2023   Hypertensive urgency 01/24/2024   Hypotension 12/01/2023   Iron  deficiency anemia, unspecified 10/26/2023   Long term (current) use of insulin  (HCC) 10/26/2023   Mechanical complication of dialysis catheter 07/29/2024   Multiple pulmonary nodules determined by computed tomography of lung 12/13/2024   Necrotizing fasciitis (HCC) 10/13/2017   Neuromuscular disorder (HCC)    neuropathy   Non-pressure chronic ulcer of other part of left foot limited to breakdown of skin (HCC) 02/02/2018   Obstructive and reflux uropathy, unspecified 10/26/2023   OM (osteomyelitis) (HCC) 01/14/2023   Other disorders of phosphorus metabolism 11/29/2023   PAD (peripheral artery disease)    Patellar tendinitis 10/12/2023   Pleural effusion 02/16/2023   Pneumonia of right lower lobe due to infectious organism 12/27/2022   Pressure ulcer of right buttock, unstageable (HCC) 06/13/2024   Pruritus, unspecified 11/14/2024   Renal osteodystrophy 10/26/2023   Secondary hyperparathyroidism    Secondary hyperparathyroidism of renal origin 10/26/2023   Sensorineural hearing loss (SNHL) of both ears 02/02/2022   SVT (supraventricular tachycardia)    hx   Tinnitus of both ears 02/02/2022   Tobacco dependence 09/11/2022   Type 2 diabetes mellitus with diabetic neuropathy, unspecified (HCC) 10/26/2023   Type 2 diabetes mellitus with diabetic peripheral angiopathy without  gangrene (HCC) 10/26/2023   Uncontrolled type 2 diabetes mellitus with hyperglycemia, with long-term current use of insulin  (HCC) 10/11/2017   Unspecified diastolic (congestive) heart failure (HCC) 10/26/2023   Unspecified protein-calorie malnutrition 10/29/2023   Urinary retention 10/14/2023   Wound dehiscence 11/24/2017    Past Surgical History:  Procedure Laterality Date   A/V FISTULAGRAM N/A 03/17/2024   Procedure: A/V Fistulagram;  Surgeon: Magda Debby SAILOR, MD;  [facility]: [facility]  INVASIVE CV LAB;  Service: Cardiovascular;  Laterality: N/A;   AMPUTATION Right 10/13/2017   Procedure: RIGHT BELOW KNEE AMPUTATION;  Surgeon: Harden Jerona GAILS, MD;  Location: [facility] OR;  Service: Orthopedics;  Laterality: Right;   AMPUTATION Right 11/24/2017   Procedure: AMPUTATION BELOW KNEE REVISION;  Surgeon: Harden Jerona GAILS, MD;  Location: [facility] OR;  Service: Orthopedics;  Laterality: Right;   AMPUTATION TOE Left    AV FISTULA PLACEMENT Left 10/19/2023   Procedure: LEFT BRACHIOCEPHALIC ARTERIOVENOUS (AV) FISTULA CREATION;  Surgeon: Pearline Norman RAMAN, MD;  Location: [facility] OR;  Service: Vascular;  Laterality: Left;   AV FISTULA PLACEMENT Left 01/26/2024   Procedure: LEFT ARTERIOVENOUS (AV) FISTULA CREATION;  Surgeon: Pearline Norman RAMAN, MD;  Location: [facility] OR;  Service: Vascular;  Laterality: Left;   BASCILIC VEIN TRANSPOSITION Left 04/26/2024   Procedure: LEFT SECOND STAGE TRANSPOSITION, VEIN, BASILIC;  Surgeon: Magda Debby SAILOR, MD;  Location: [facility] OR;  Service: Vascular;  Laterality: Left;   BIOPSY  09/02/2021   Procedure: BIOPSY;  Surgeon: Federico Rosario BROCKS, MD;  Location: [facility] ENDOSCOPY;  Service: Gastroenterology;;   BIOPSY  02/18/2023   Procedure: BIOPSY;  Surgeon: Leigh Elspeth SQUIBB, MD;  Location: [facility] ENDOSCOPY;  Service: Gastroenterology;;   COLONOSCOPY  05/11/2019   COLONOSCOPY  2021   3 polyps - tubular adenomas   ESOPHAGOGASTRODUODENOSCOPY (EGD) WITH PROPOFOL  N/A 09/02/2021   Procedure: ESOPHAGOGASTRODUODENOSCOPY (EGD) WITH PROPOFOL ;  Surgeon: Federico Rosario BROCKS, MD;  Location: [facility] ENDOSCOPY;  Service: Gastroenterology;  Laterality: N/A;   ESOPHAGOGASTRODUODENOSCOPY (EGD) WITH PROPOFOL  N/A 02/18/2023   Procedure: ESOPHAGOGASTRODUODENOSCOPY (EGD) WITH PROPOFOL ;  Surgeon: Leigh Elspeth SQUIBB, MD;  Location: [facility] ENDOSCOPY;  Service: Gastroenterology;  Laterality: N/A;   INCISION AND DRAINAGE OF WOUND Left 05/17/2024   Procedure: IRRIGATION AND DEBRIDEMENT WOUND;  Surgeon: Serene Gaile ORN, MD;  Location: [facility] OR;   Service: Vascular;  Laterality: Left;  Arm   INTRAMEDULLARY (IM) NAIL INTERTROCHANTERIC Right 10/15/2023   Procedure: INTRAMEDULLARY (IM) NAIL INTERTROCHANTERIC;  Surgeon: Kendal Franky SQUIBB, MD;  Location: [facility] OR;  Service: Orthopedics;  Laterality: Right;   IR FLUORO GUIDE CV LINE RIGHT  10/15/2023   IR TUNNELED CENTRAL VENOUS CATH PLC W IMG  07/31/2024   IR US  GUIDE VASC ACCESS RIGHT  10/15/2023   IRRIGATION AND DEBRIDEMENT KNEE Right 08/04/2024   Procedure: IRRIGATION AND DEBRIDEMENT KNEE, RIGHT;  Surgeon: Harden Jerona GAILS, MD;  Location: [facility] OR;  Service: Orthopedics;  Laterality: Right;  Debridement Right Popliteal Fossa Abscess   POLYPECTOMY     WISDOM TOOTH EXTRACTION      MEDICATIONS:  albuterol  (VENTOLIN  HFA) 108 (90 Base) MCG/ACT inhaler   atorvastatin  (LIPITOR) 40 MG tablet   Calcium  Acetate 667 MG TABS   carvedilol  (COREG ) 12.5 MG tablet   diltiazem  (CARDIZEM  CD) 300 MG 24 hr capsule   FLUoxetine  (PROZAC ) 40 MG capsule   fluticasone  (FLONASE ) 50 MCG/ACT nasal spray   Fluticasone -Umeclidin-Vilant (TRELEGY ELLIPTA ) 100-62.5-25 MCG/ACT AEPB   furosemide  (LASIX ) 40 MG tablet   gabapentin  (NEURONTIN ) 300 MG capsule   hydrALAZINE  (APRESOLINE ) 100 MG  tablet   insulin  lispro (HUMALOG ) 100 UNIT/ML injection   lidocaine -prilocaine  (EMLA ) cream   losartan  (COZAAR ) 25 MG tablet   methocarbamol  (ROBAXIN ) 750 MG tablet   omeprazole (PRILOSEC) 20 MG capsule   ondansetron  (ZOFRAN -ODT) 4 MG disintegrating tablet   Oxycodone  HCl 10 MG TABS   tamsulosin  (FLOMAX ) 0.4 MG CAPS capsule   Accu-Chek Softclix Lancets lancets   Blood Glucose Monitoring Suppl (ACCU-CHEK GUIDE) w/Device KIT   glucose blood (ONETOUCH VERIO) test strip   insulin  glargine (LANTUS ) 100 UNIT/ML injection   pantoprazole  (PROTONIX ) 40 MG tablet   polyethylene glycol (MIRALAX  / GLYCOLAX ) 17 g packet    Nyssa Sayegh, PA-C Surgical Short Stay/Anesthesiology [facility] Phone [phone] [facility] Phone [phone] 12/19/2024 4:00  PM

## 2024-12-20 ENCOUNTER — Encounter (HOSPITAL_COMMUNITY)
Admission: RE | Disposition: A | Discharge status: Home / Self Care | Payer: Self-pay | Source: Home / Self Care | Attending: Vascular Surgery

## 2024-12-20 ENCOUNTER — Ambulatory Visit (HOSPITAL_BASED_OUTPATIENT_CLINIC_OR_DEPARTMENT_OTHER): Admitting: Vascular Surgery

## 2024-12-20 ENCOUNTER — Encounter (HOSPITAL_COMMUNITY): Admitting: Vascular Surgery

## 2024-12-20 ENCOUNTER — Ambulatory Visit (HOSPITAL_COMMUNITY)
Admission: RE | Admit: 2024-12-20 | Discharge: 2024-12-20 | Disposition: A | Discharge status: Home / Self Care | Attending: Vascular Surgery | Admitting: Vascular Surgery

## 2024-12-20 ENCOUNTER — Encounter (HOSPITAL_COMMUNITY): Payer: Self-pay | Admitting: Vascular Surgery

## 2024-12-20 DIAGNOSIS — I509 Heart failure, unspecified: Secondary | ICD-10-CM

## 2024-12-20 DIAGNOSIS — Z79899 Other long term (current) drug therapy: Secondary | ICD-10-CM | POA: Insufficient documentation

## 2024-12-20 DIAGNOSIS — Z89422 Acquired absence of other left toe(s): Secondary | ICD-10-CM | POA: Diagnosis not present

## 2024-12-20 DIAGNOSIS — I471 Supraventricular tachycardia, unspecified: Secondary | ICD-10-CM | POA: Insufficient documentation

## 2024-12-20 DIAGNOSIS — E1122 Type 2 diabetes mellitus with diabetic chronic kidney disease: Secondary | ICD-10-CM | POA: Diagnosis not present

## 2024-12-20 DIAGNOSIS — E7849 Other hyperlipidemia: Secondary | ICD-10-CM | POA: Diagnosis not present

## 2024-12-20 DIAGNOSIS — Z794 Long term (current) use of insulin: Secondary | ICD-10-CM | POA: Insufficient documentation

## 2024-12-20 DIAGNOSIS — Z992 Dependence on renal dialysis: Secondary | ICD-10-CM | POA: Diagnosis not present

## 2024-12-20 DIAGNOSIS — Z89511 Acquired absence of right leg below knee: Secondary | ICD-10-CM | POA: Diagnosis not present

## 2024-12-20 DIAGNOSIS — K219 Gastro-esophageal reflux disease without esophagitis: Secondary | ICD-10-CM | POA: Insufficient documentation

## 2024-12-20 DIAGNOSIS — I132 Hypertensive heart and chronic kidney disease with heart failure and with stage 5 chronic kidney disease, or end stage renal disease: Secondary | ICD-10-CM | POA: Insufficient documentation

## 2024-12-20 DIAGNOSIS — E1151 Type 2 diabetes mellitus with diabetic peripheral angiopathy without gangrene: Secondary | ICD-10-CM | POA: Diagnosis not present

## 2024-12-20 DIAGNOSIS — Z833 Family history of diabetes mellitus: Secondary | ICD-10-CM | POA: Diagnosis not present

## 2024-12-20 DIAGNOSIS — Z818 Family history of other mental and behavioral disorders: Secondary | ICD-10-CM | POA: Diagnosis not present

## 2024-12-20 DIAGNOSIS — N2581 Secondary hyperparathyroidism of renal origin: Secondary | ICD-10-CM | POA: Insufficient documentation

## 2024-12-20 DIAGNOSIS — F17210 Nicotine dependence, cigarettes, uncomplicated: Secondary | ICD-10-CM | POA: Diagnosis not present

## 2024-12-20 DIAGNOSIS — Z8249 Family history of ischemic heart disease and other diseases of the circulatory system: Secondary | ICD-10-CM | POA: Diagnosis not present

## 2024-12-20 DIAGNOSIS — I5032 Chronic diastolic (congestive) heart failure: Secondary | ICD-10-CM | POA: Diagnosis not present

## 2024-12-20 DIAGNOSIS — F32A Depression, unspecified: Secondary | ICD-10-CM | POA: Insufficient documentation

## 2024-12-20 DIAGNOSIS — E1140 Type 2 diabetes mellitus with diabetic neuropathy, unspecified: Secondary | ICD-10-CM | POA: Insufficient documentation

## 2024-12-20 DIAGNOSIS — E1169 Type 2 diabetes mellitus with other specified complication: Secondary | ICD-10-CM | POA: Diagnosis not present

## 2024-12-20 DIAGNOSIS — Z83438 Family history of other disorder of lipoprotein metabolism and other lipidemia: Secondary | ICD-10-CM | POA: Diagnosis not present

## 2024-12-20 DIAGNOSIS — Z8349 Family history of other endocrine, nutritional and metabolic diseases: Secondary | ICD-10-CM | POA: Diagnosis not present

## 2024-12-20 DIAGNOSIS — N186 End stage renal disease: Secondary | ICD-10-CM

## 2024-12-20 LAB — POCT I-STAT, CHEM 8
BUN: 40 mg/dL — ABNORMAL HIGH (ref 6–20)
Calcium, Ion: 1.01 mmol/L — ABNORMAL LOW (ref 1.15–1.40)
Chloride: 105 mmol/L (ref 98–111)
Creatinine, Ser: 9.2 mg/dL — ABNORMAL HIGH (ref 0.61–1.24)
Glucose, Bld: 140 mg/dL — ABNORMAL HIGH (ref 70–99)
HCT: 36 % — ABNORMAL LOW (ref 39.0–52.0)
Hemoglobin: 12.2 g/dL — ABNORMAL LOW (ref 13.0–17.0)
Potassium: 4.7 mmol/L (ref 3.5–5.1)
Sodium: 139 mmol/L (ref 135–145)
TCO2: 21 mmol/L — ABNORMAL LOW (ref 22–32)

## 2024-12-20 LAB — GLUCOSE, CAPILLARY
Glucose-Capillary: 145 mg/dL — ABNORMAL HIGH (ref 70–99)
Glucose-Capillary: 136 mg/dL — ABNORMAL HIGH (ref 70–99)

## 2024-12-20 MED ORDER — CHLORHEXIDINE GLUCONATE CLOTH 2 % EX PADS: 6.0000 | MEDICATED_PAD | Freq: Every day | CUTANEOUS | Status: DC

## 2024-12-20 MED ORDER — LIDOCAINE-EPINEPHRINE (PF) 1 %-1:200000 IJ SOLN
INTRAMUSCULAR | Status: AC
  Filled 2024-12-20: qty 30

## 2024-12-20 MED ORDER — ONDANSETRON HCL 4 MG/2ML IJ SOLN: 4.0000 mg | Freq: Once | INTRAMUSCULAR | Status: DC | PRN

## 2024-12-20 MED ORDER — ROPIVACAINE HCL 5 MG/ML IJ SOLN
INTRAMUSCULAR | Status: DC | PRN
  Administered 2024-12-20: 30 mL via PERINEURAL

## 2024-12-20 MED ORDER — CARVEDILOL 12.5 MG PO TABS
12.5000 mg | ORAL_TABLET | Freq: Once | ORAL | Status: AC
  Administered 2024-12-20: 12.5 mg via ORAL

## 2024-12-20 MED ORDER — HEPARIN SODIUM (PORCINE) 1000 UNIT/ML IJ SOLN
2000.0000 [IU] | Freq: Once | INTRAMUSCULAR | Status: AC
  Administered 2024-12-20: 2000 [IU]

## 2024-12-20 MED ORDER — INSULIN ASPART 100 UNIT/ML IJ SOLN: 0.0000 [IU] | INTRAMUSCULAR | Status: DC | PRN

## 2024-12-20 MED ORDER — OXYCODONE HCL 5 MG PO TABS: 5.0000 mg | ORAL_TABLET | ORAL | 0 refills | Status: AC | PRN

## 2024-12-20 MED ORDER — CARVEDILOL 12.5 MG PO TABS
ORAL_TABLET | ORAL | Status: AC
  Filled 2024-12-20: qty 1

## 2024-12-20 MED ORDER — FENTANYL CITRATE (PF) 100 MCG/2ML IJ SOLN
INTRAMUSCULAR | Status: DC | PRN
  Administered 2024-12-20 (×2): 50 ug via INTRAVENOUS

## 2024-12-20 MED ORDER — MIDAZOLAM HCL 2 MG/2ML IJ SOLN
INTRAMUSCULAR | Status: AC
  Filled 2024-12-20: qty 2

## 2024-12-20 MED ORDER — PROPOFOL 10 MG/ML IV BOLUS
INTRAVENOUS | Status: AC
  Filled 2024-12-20: qty 20

## 2024-12-20 MED ORDER — ONDANSETRON HCL 4 MG/2ML IJ SOLN
INTRAMUSCULAR | Status: DC | PRN
  Administered 2024-12-20: 4 mg via INTRAVENOUS

## 2024-12-20 MED ORDER — CEFAZOLIN SODIUM-DEXTROSE 2-4 GM/100ML-% IV SOLN
2.0000 g | INTRAVENOUS | Status: AC
  Administered 2024-12-20: 2 g via INTRAVENOUS
  Filled 2024-12-20: qty 100

## 2024-12-20 MED ORDER — 0.9 % SODIUM CHLORIDE (POUR BTL) OPTIME
TOPICAL | Status: DC | PRN
  Administered 2024-12-20: 1000 mL

## 2024-12-20 MED ORDER — FENTANYL CITRATE (PF) 100 MCG/2ML IJ SOLN: 25.0000 ug | INTRAMUSCULAR | Status: DC | PRN

## 2024-12-20 MED ORDER — PHENYLEPHRINE HCL-NACL 20-0.9 MG/250ML-% IV SOLN
INTRAVENOUS | Status: DC | PRN
  Administered 2024-12-20: 20 ug/min via INTRAVENOUS

## 2024-12-20 MED ORDER — ACETAMINOPHEN 500 MG PO TABS
1000.0000 mg | ORAL_TABLET | Freq: Once | ORAL | Status: AC
  Administered 2024-12-20: 1000 mg via ORAL
  Filled 2024-12-20: qty 2

## 2024-12-20 MED ORDER — PAPAVERINE HCL 30 MG/ML IJ SOLN
INTRAMUSCULAR | Status: AC
  Filled 2024-12-20: qty 2

## 2024-12-20 MED ORDER — SODIUM CHLORIDE 0.9 % IV SOLN: INTRAVENOUS | Status: DC

## 2024-12-20 MED ORDER — HEPARIN SODIUM (PORCINE) 1000 UNIT/ML IJ SOLN
INTRAMUSCULAR | Status: DC | PRN
  Administered 2024-12-20: 5000 [IU] via INTRAVENOUS

## 2024-12-20 MED ORDER — CHLORHEXIDINE GLUCONATE 0.12 % MT SOLN
15.0000 mL | Freq: Once | OROMUCOSAL | Status: AC
  Administered 2024-12-20: 15 mL via OROMUCOSAL
  Filled 2024-12-20: qty 15

## 2024-12-20 MED ORDER — HEPARIN SODIUM (PORCINE) 1000 UNIT/ML IJ SOLN
INTRAMUSCULAR | Status: AC
  Filled 2024-12-20: qty 2

## 2024-12-20 MED ORDER — SODIUM CHLORIDE 0.9% FLUSH: 10.0000 mL | INTRAVENOUS | Status: DC | PRN

## 2024-12-20 MED ORDER — CHLORHEXIDINE GLUCONATE 4 % EX SOLN: 60.0000 mL | Freq: Once | CUTANEOUS | Status: DC

## 2024-12-20 MED ORDER — SODIUM CHLORIDE 0.9% FLUSH: 10.0000 mL | Freq: Two times a day (BID) | INTRAVENOUS | Status: DC

## 2024-12-20 MED ORDER — HEPARIN 6000 UNIT IRRIGATION SOLUTION
Status: DC | PRN
  Administered 2024-12-20: 1

## 2024-12-20 MED ORDER — FENTANYL CITRATE (PF) 100 MCG/2ML IJ SOLN
INTRAMUSCULAR | Status: AC
  Filled 2024-12-20: qty 2

## 2024-12-20 MED ORDER — PHENYLEPHRINE 80 MCG/ML (10ML) SYRINGE FOR IV PUSH (FOR BLOOD PRESSURE SUPPORT)
PREFILLED_SYRINGE | INTRAVENOUS | Status: DC | PRN
  Administered 2024-12-20: 160 ug via INTRAVENOUS

## 2024-12-20 MED ORDER — MIDAZOLAM HCL (PF) 2 MG/2ML IJ SOLN
INTRAMUSCULAR | Status: DC | PRN
  Administered 2024-12-20: 2 mg via INTRAVENOUS

## 2024-12-20 MED ORDER — PROPOFOL 10 MG/ML IV BOLUS
INTRAVENOUS | Status: DC | PRN
  Administered 2024-12-20: 20 mg via INTRAVENOUS
  Administered 2024-12-20: 40 mg via INTRAVENOUS

## 2024-12-20 MED ORDER — ORAL CARE MOUTH RINSE: 15.0000 mL | Freq: Once | OROMUCOSAL | Status: AC

## 2024-12-20 MED ORDER — PROPOFOL 500 MG/50ML IV EMUL
INTRAVENOUS | Status: DC | PRN
  Administered 2024-12-20: 125 ug/kg/min via INTRAVENOUS

## 2024-12-20 MED ORDER — HEPARIN 6000 UNIT IRRIGATION SOLUTION
Status: AC
  Filled 2024-12-20: qty 500

## 2024-12-20 NOTE — Interval H&P Note (Signed)
 History and Physical Interval Note:  12/20/2024 7:31 AM  [name] [name]  has presented today for surgery, with the diagnosis of ESRD.  The various methods of treatment have been discussed with the patient and family. After consideration of risks, benefits and other options for treatment, the patient has consented to  Procedures: INSERTION, GRAFT, ARTERIOVENOUS, UPPER EXTREMITY (Left) as a surgical intervention.  The patient's history has been reviewed, patient examined, no change in status, stable for surgery.  I have reviewed the patient's chart and labs.  Questions were answered to the patient's satisfaction.     Debby LOISE Robertson

## 2024-12-20 NOTE — Anesthesia Procedure Notes (Signed)
 Anesthesia Regional Block: Supraclavicular block   Pre-Anesthetic Checklist: , timeout performed,  Correct Patient, Correct Site, Correct Laterality,  Correct Procedure, Correct Position, site marked,  Risks and benefits discussed,  Surgical consent,  Pre-op  evaluation,  At surgeon's request and post-op pain management  Laterality: Left  Prep: chloraprep       Needles:  Injection technique: Single-shot  Needle Type: Echogenic Needle     Needle Length: 9cm  Needle Gauge: 21     Additional Needles:   Procedures:,,,, ultrasound used (permanent image in chart),,    Narrative:  Start time: 12/20/2024 6:53 AM End time: 12/20/2024 7:00 AM Injection made incrementally with aspirations every 5 mL.  Performed by: Personally  Anesthesiologist: Corinne Garnette BRAVO, MD  Additional Notes: No pain on injection. No increased resistance to injection. Injection made in 5cc increments.  Good needle visualization.  Patient tolerated procedure well.

## 2024-12-20 NOTE — Transfer of Care (Signed)
 Immediate Anesthesia Transfer of Care Note  Patient: [name] [name]  Procedure(s) Performed: INSERTION, GRAFT, ARTERIOVENOUS, UPPER EXTREMITY (Left: Arm Upper)  Patient Location: PACU  Anesthesia Type:MAC and Regional  Level of Consciousness: awake, alert , and oriented  Airway & Oxygen Therapy: Patient Spontanous Breathing and Patient connected to face mask oxygen  Post-op Assessment: Report given to RN and Post -op Vital signs reviewed and stable  Post vital signs: Reviewed and stable  Last Vitals:  Vitals Value Taken Time  BP 132/78 12/20/24 09:18  Temp    Pulse 80 12/20/24 09:20  Resp 11 12/20/24 09:20  SpO2 100 % 12/20/24 09:20  Vitals shown include unfiled device data.  Last Pain:  Vitals:   12/20/24 0645  TempSrc:   PainSc: 3       Patients Stated Pain Goal: 0 (12/20/24 0645)  Complications: There were no known notable events for this encounter.

## 2024-12-20 NOTE — Anesthesia Postprocedure Evaluation (Signed)
"   Anesthesia Post Note  Patient: [name] [name]  Procedure(s) Performed: INSERTION, GRAFT, ARTERIOVENOUS, UPPER EXTREMITY (Left: Arm Upper)     Patient location during evaluation: PACU Anesthesia Type: Regional Level of consciousness: awake and alert Pain management: pain level controlled Vital Signs Assessment: post-procedure vital signs reviewed and stable Respiratory status: spontaneous breathing, nonlabored ventilation and respiratory function stable Cardiovascular status: stable and blood pressure returned to baseline Postop Assessment: no apparent nausea or vomiting Anesthetic complications: no   There were no known notable events for this encounter.  Last Vitals:  Vitals:   12/20/24 0930 12/20/24 0945  BP: (!) 151/86 (!) 155/88  Pulse: 82 79  Resp: 18 17  Temp:  36.7 C  SpO2: 94% 95%    Last Pain:  Vitals:   12/20/24 0945  TempSrc:   PainSc: Asleep                 Garnette FORBES Skillern      "

## 2024-12-20 NOTE — Op Note (Signed)
 DATE OF SERVICE: 12/20/2024  PATIENT:  [name] [name]  54 y.o. male  PRE-OPERATIVE DIAGNOSIS:  ESRD on HD  POST-OPERATIVE DIAGNOSIS:  Same  PROCEDURE:   Left arm brachial artery to axillary vein arteriovenous graft  SURGEON:  Surgeons and Role:    * Magda Debby SAILOR, MD - Primary  ASSISTANT: Curry Damme, PA-C  An experienced assistant was required given the complexity of this procedure and the standard of surgical care. My assistant helped with exposure through counter tension, suctioning, ligation and retraction to better visualize the surgical field.  My assistant expedited sewing during the case by following my sutures. Wherever I use the term we in the report, my assistant actively helped me with that portion of the procedure.  ANESTHESIA:   regional and MAC  EBL: min  BLOOD ADMINISTERED:none  DRAINS: none   LOCAL MEDICATIONS USED:  NONE  SPECIMEN:  none  COUNTS: confirmed correct.  TOURNIQUET:  none  PATIENT DISPOSITION:  PACU - hemodynamically stable.   Delay start of Pharmacological VTE agent (>24hrs) due to surgical blood loss or risk of bleeding: no  INDICATION FOR PROCEDURE: [name] [name] is a 54 y.o. male with ESRD on HD via TDC. After careful discussion of risks, benefits, and alternatives the patient was offered left arm AVG. The patient understood and wished to proceed.  OPERATIVE FINDINGS: healthy brachial artery and axillary vein. Good result from AVG. Brisk doppler bruit at completion. Brisk radial doppler signal at completion.   DESCRIPTION OF PROCEDURE: After identification of the patient in the pre-operative holding area, the patient was transferred to the operating room. The patient was positioned supine on the operating room table. Anesthesia was induced. The left arm was prepped and draped in standard fashion. A surgical pause was performed confirming correct patient, procedure, and operative location.  The left brachial artery was  exposed using a longitudinal incision in the distal arm just above the antecubital fossa.  Incision was carried down through subcutaneous tissue until the brachial sheath was encountered.  This was incised sharply.  The brachial artery was exposed and encircled with Silastic Vesseloops proximally and distally to the site of planned inflow.   The left axillary vein at the axilla was exposed using longitudinal incision just below the hairbearing area of the axilla.  Incision was carried down until the brachial sheath was encountered.  The brachial vein was identified, exposed, encircled with Silastic Vesseloops.   Using a curved, sheathed tunneling device, a 4-7 mm tapered Gore-Tex graft was tunneled subcutaneously and gentle arc across the biceps of the left arm.  Patient was then heparinized with 5000 units of IV heparin .   The brachial artery was clamped proximally and distally.  An anterior arteriotomy was made with an 11 blade.  This was extended with Potts scissors.  The 4 mm end of the Gore-Tex graft was spatulated and then anastomosed end-to-side to the brachial arteriotomy using continuous running suture of 6-0 Prolene.  The anastomosis was completed and hemostasis ensured.  The graft was clamped to restore perfusion to the hand.   The brachial vein was clamped proximally and distally.  An anterior venotomy was made with an 11 blade.  This was extended with Potts scissors.  The 7 mm end of the Gore-Tex graft was then anastomosed end to side to the brachial vein venotomy using continuous running suture of 6-0 Prolene.  Immediately prior to completion the anastomosis was de-aired and flushed.  Anastomosis was then completed.  Hemostasis was insured.  Doppler machine was brought onto the field to interrogate the graft.   Doppler flow was noted in the radial artery.  About the arterial anastomosis flow was noted proximal and distal to the arterial anastomosis.  Distal to the venous anastomosis a   Doppler bruit was heard.  Satisfied we ended the case here.   Surgical beds were irrigated copiously.  Hemostasis was again ensured in the surgical beds.  The wounds were closed in layers using 3-0 Vicryl and 4-0 Monocryl.  Clean bandages were applied.   Upon completion of the case instrument and sharps counts were confirmed correct. The patient was transferred to the  PACU in good condition. I was present for all portions of the procedure.  FOLLOW UP PLAN: Assuming a normal postoperative course, VVS PA or I will see the patient in 4 weeks.   Debby SAILOR. Magda, MD [facility] Phone Number: [phone] 12/20/2024 9:24 AM

## 2024-12-20 NOTE — Discharge Instructions (Signed)
 "  [facility]  Discharge Instructions  AV Fistula or Graft Surgery for Dialysis Access  Please refer to the following instructions for your post-procedure care. Your surgeon or physician assistant will discuss any changes with you.  Activity  You may drive the day following your surgery, if you are comfortable and no longer taking prescription pain medication. Resume full activity as the soreness in your incision resolves.  Bathing/Showering  You may shower after you go home. Keep your incision dry for 48 hours. Do not soak in a bathtub, hot tub, or swim until the incision heals completely. You may not shower if you have a hemodialysis catheter.  Incision Care  Clean your incision with mild soap and water after 48 hours. Pat the area dry with a clean towel. You do not need a bandage unless otherwise instructed. Do not apply any ointments or creams to your incision. You may have skin glue on your incision. Do not peel it off. It will come off on its own in about one week. Your arm may swell a bit after surgery. To reduce swelling use pillows to elevate your arm so it is above your heart. Your doctor will tell you if you need to lightly wrap your arm with an ACE bandage.  Diet  Resume your normal diet. There are not special food restrictions following this procedure. In order to heal from your surgery, it is CRITICAL to get adequate nutrition. Your body requires vitamins, minerals, and protein. Vegetables are the best source of vitamins and minerals. Vegetables also provide the perfect balance of protein. Processed food has little nutritional value, so try to avoid this.  Medications  Resume taking all of your medications. If your incision is causing pain, you may take over-the counter pain relievers such as acetaminophen  (Tylenol ). If you were prescribed a stronger pain medication, please be aware these medications can cause nausea and constipation. Prevent  nausea by taking the medication with a snack or meal. Avoid constipation by drinking plenty of fluids and eating foods with high amount of fiber, such as fruits, vegetables, and grains.  Do not take Tylenol  if you are taking prescription pain medications.  Follow up Your surgeon may want to see you in the office following your access surgery. If so, this will be arranged at the time of your surgery.  Please call us  immediately for any of the following conditions:  Increased pain, redness, drainage (pus) from your incision site Fever of 101 degrees or higher Severe or worsening pain at your incision site Hand pain or numbness.  Reduce your risk of vascular disease:  Stop smoking. If you would like help, call [facility] at 1-800-QUIT-NOW ((209)663-4256) or [place] at [phone]  Manage your cholesterol Maintain a desired weight Control your diabetes Keep your blood pressure down  Dialysis  It will take several weeks to several months for your new dialysis access to be ready for use. Your surgeon will determine when it is okay to use it. Your nephrologist will continue to direct your dialysis. You can continue to use your Permcath until your new access is ready for use.   12/20/2024 [name] [name] [id number] 16-Nov-1971  Surgeon(s): Magda Debby SAILOR, MD  Procedures: INSERTION, GRAFT, ARTERIOVENOUS, UPPER EXTREMITY   May stick graft immediately   May stick graft on designated area only:   X Do not stick Left AV Graft for 4 weeks    If you have any questions, please call the office  at [phone]. "

## 2024-12-21 ENCOUNTER — Encounter (HOSPITAL_COMMUNITY): Payer: Self-pay | Admitting: Vascular Surgery

## 2024-12-21 ENCOUNTER — Other Ambulatory Visit: Payer: Self-pay | Admitting: Vascular Surgery

## 2024-12-21 DIAGNOSIS — N186 End stage renal disease: Secondary | ICD-10-CM

## 2024-12-25 ENCOUNTER — Other Ambulatory Visit: Payer: Self-pay | Admitting: Family Medicine

## 2024-12-25 DIAGNOSIS — M62838 Other muscle spasm: Secondary | ICD-10-CM

## 2024-12-26 ENCOUNTER — Encounter: Payer: Self-pay | Admitting: Family Medicine

## 2024-12-26 ENCOUNTER — Encounter (HOSPITAL_COMMUNITY): Payer: Self-pay

## 2024-12-26 ENCOUNTER — Other Ambulatory Visit: Payer: Self-pay

## 2024-12-26 ENCOUNTER — Ambulatory Visit: Admitting: Family Medicine

## 2024-12-26 VITALS — BP 160/80 | HR 82 | Temp 97.6°F | Wt 221.0 lb

## 2024-12-26 DIAGNOSIS — T300 Burn of unspecified body region, unspecified degree: Secondary | ICD-10-CM | POA: Diagnosis not present

## 2024-12-26 DIAGNOSIS — Z992 Dependence on renal dialysis: Secondary | ICD-10-CM | POA: Diagnosis not present

## 2024-12-26 DIAGNOSIS — Z794 Long term (current) use of insulin: Secondary | ICD-10-CM

## 2024-12-26 DIAGNOSIS — R110 Nausea: Secondary | ICD-10-CM

## 2024-12-26 DIAGNOSIS — N186 End stage renal disease: Secondary | ICD-10-CM | POA: Diagnosis not present

## 2024-12-26 DIAGNOSIS — E1122 Type 2 diabetes mellitus with diabetic chronic kidney disease: Secondary | ICD-10-CM

## 2024-12-26 DIAGNOSIS — D631 Anemia in chronic kidney disease: Secondary | ICD-10-CM

## 2024-12-26 DIAGNOSIS — I10 Essential (primary) hypertension: Secondary | ICD-10-CM

## 2024-12-26 LAB — POCT GLYCOSYLATED HEMOGLOBIN (HGB A1C): HbA1c, POC (controlled diabetic range): 6.1 % (ref 0.0–7.0)

## 2024-12-26 MED ORDER — MUPIROCIN 2 % EX OINT: 1.0000 | TOPICAL_OINTMENT | Freq: Two times a day (BID) | CUTANEOUS | 0 refills | Status: AC

## 2024-12-26 MED ORDER — ONDANSETRON 4 MG PO TBDP: 4.0000 mg | ORAL_TABLET | Freq: Three times a day (TID) | ORAL | 2 refills | Status: AC | PRN

## 2024-12-26 MED ORDER — XEROFORM OCCLUSIVE GAUZE PATCH 3 % EX PADS: 1.0000 | MEDICATED_PAD | Freq: Every day | CUTANEOUS | 1 refills | Status: AC

## 2024-12-26 NOTE — Patient Instructions (Signed)
 It was wonderful to see you today! Thank you for choosing [facility].   Please bring ALL of your medications with you to every visit.   Today we talked about:  Your A1c is 6.1, this is very good and we do not need to make any changes to your diabetes care. For your hand and like you to use the topical mupirocin  twice per day for an antibacterial and put Xeroform pads on top of it if possible.  I sent these to your pharmacy but there is a chance it will not be covered by insurance unfortunately.  Please just ask the pharmacist what might be similarly you can get over-the-counter.  You could also just use the antibacterial ointment and try to keep the area moist and put a bandage or gauze over the area.  Please follow-up for recheck in about a week to make sure it does not look infected and if you develop any redness warmth or pus drainage you need to be seen immediately as these can get infected. I refilled the Zofran  to your pharmacy and you will have refill available.  Please follow-up with your PCP Dr. Adele in the next week or so for further discussion about imaging results and medications.  Please follow up in 1 week  Call the clinic at [phone] if your symptoms worsen or you have any concerns.  Please be sure to schedule follow up at the front desk before you leave today.   Izetta Nap, DO Family Medicine

## 2024-12-26 NOTE — Assessment & Plan Note (Signed)
 Followed by nephrology, receiving 1-2 times weekly iron  infusions at HD.  Will defer additional lab evaluation to them.

## 2024-12-26 NOTE — Progress Notes (Signed)
" ° ° °  SUBJECTIVE:   CHIEF COMPLAINT / HPI:   R hand burn Occurred over the weekend approximately 4 days ago.  Reports he was trying to cook in the kitchen when he burned himself on a pot.  Significant pain but feels like he area is healing slowly.  Denies fever, N/B or purulent drainage.  Still taking the Oxycodone  from graft surgery last week, has a small amount still available.  Nausea Has been ongoing, would like Zofran  refill on hand.  Unclear etiology, reports likely related to ongoing dialysis.  Anemia Getting IV iron  1-2 times per day at dialysis.  Reports his nephrologist is monitoring his anemia closely.  Hypertension: - Medications: Carvedilol  12.5 mg twice daily, Lasix  40 mg twice daily, hydralazine  200 mg a.m. 100 mg p.m., losartan  25 mg daily - Compliance: Yes - Checking BP at home: Has been in 180s. Not going too low in dialysis - Denies any SOB, CP, vision changes, LE edema, medication SEs, or symptoms of hypotension  PERTINENT  PMH / PSH:, GERD, HLD, anemia of chronic disease, right BKA  OBJECTIVE:   BP (!) 160/80   Pulse 82   Temp 97.6 F (36.4 C)   Wt 221 lb (100.2 kg)   SpO2 98%   BMI 26.21 kg/m    General: NAD, pleasant, able to participate in exam Cardiac: RRR, no murmurs. Respiratory: CTAB, normal effort, No wheezes, rales or rhonchi Extremities: Right BKA with prosthetic in place.  Ambulating with relating walker Skin: warm and dry.  Multiple burns along left thumb, index finger and middle finger with granulation tissue present.  No surrounding erythema or purulent drainage.  Neuro: alert, no obvious focal deficits Psych: Normal affect and mood  ASSESSMENT/PLAN:   Assessment & Plan Burn Present along right thumb, index and middle fingers.  Granulation tissue present without signs of infection.  Small area of fluid pocket on index finger, already with signs of draining.  Recommended burn dressing with mupirocin  and Xeroform.  Follow-up in 1 week for  repeat evaluation and ED return precautions discussed. -Start mupirocin  twice daily covered by Xeroform dressing -Follow-up in 1 week Type 2 diabetes mellitus with chronic kidney disease on chronic dialysis, with long-term current use of insulin  (HCC) A1c 6.1, at goal although less reliable given ESRD.  Will continue on current regimen. Anemia due to chronic kidney disease, on chronic dialysis (HCC) Followed by nephrology, receiving 1-2 times weekly iron  infusions at HD.  Will defer additional lab evaluation to them. Nausea Unclear etiology, has been longstanding and intermittent without clear trigger.  Possibly dialysis related but only requiring intermittent use of Zofran  and requesting refill. -Refill Zofran  4 mg ODT to use sparingly Severe hypertension Consistent with baseline blood pressure today, on multiple agents and will defer to nephrology for additional management.  Denies hypotensive episodes during HD.   Dr. Izetta Nap, DO [facility]     "

## 2024-12-29 ENCOUNTER — Encounter (HOSPITAL_COMMUNITY): Payer: Self-pay | Admitting: Vascular Surgery

## 2025-01-15 ENCOUNTER — Ambulatory Visit: Payer: Self-pay | Admitting: Family Medicine

## 2025-01-17 ENCOUNTER — Ambulatory Visit (HOSPITAL_COMMUNITY)

## 2025-01-17 ENCOUNTER — Encounter
